# Patient Record
Sex: Female | Born: 1970
Health system: Southern US, Community
[De-identification: ages and names within clinical notes are randomized; demographics above are authoritative.]

## PROBLEM LIST (undated history)

## (undated) DIAGNOSIS — R51 Headache: Secondary | ICD-10-CM

## (undated) DIAGNOSIS — R0602 Shortness of breath: Secondary | ICD-10-CM

## (undated) DIAGNOSIS — Z720 Tobacco use: Secondary | ICD-10-CM

## (undated) DIAGNOSIS — K219 Gastro-esophageal reflux disease without esophagitis: Secondary | ICD-10-CM

## (undated) DIAGNOSIS — J189 Pneumonia, unspecified organism: Secondary | ICD-10-CM

## (undated) DIAGNOSIS — E559 Vitamin D deficiency, unspecified: Secondary | ICD-10-CM

## (undated) DIAGNOSIS — J302 Other seasonal allergic rhinitis: Secondary | ICD-10-CM

## (undated) DIAGNOSIS — M2550 Pain in unspecified joint: Secondary | ICD-10-CM

## (undated) DIAGNOSIS — J449 Chronic obstructive pulmonary disease, unspecified: Secondary | ICD-10-CM

## (undated) DIAGNOSIS — M069 Rheumatoid arthritis, unspecified: Secondary | ICD-10-CM

## (undated) DIAGNOSIS — Z9989 Dependence on other enabling machines and devices: Secondary | ICD-10-CM

## (undated) DIAGNOSIS — F329 Major depressive disorder, single episode, unspecified: Secondary | ICD-10-CM

## (undated) DIAGNOSIS — M549 Dorsalgia, unspecified: Secondary | ICD-10-CM

## (undated) DIAGNOSIS — F419 Anxiety disorder, unspecified: Secondary | ICD-10-CM

## (undated) DIAGNOSIS — R7303 Prediabetes: Secondary | ICD-10-CM

## (undated) DIAGNOSIS — I499 Cardiac arrhythmia, unspecified: Secondary | ICD-10-CM

## (undated) DIAGNOSIS — I5032 Chronic diastolic (congestive) heart failure: Secondary | ICD-10-CM

## (undated) DIAGNOSIS — M1990 Unspecified osteoarthritis, unspecified site: Secondary | ICD-10-CM

## (undated) DIAGNOSIS — A048 Other specified bacterial intestinal infections: Secondary | ICD-10-CM

## (undated) DIAGNOSIS — F32A Depression, unspecified: Secondary | ICD-10-CM

## (undated) DIAGNOSIS — I10 Essential (primary) hypertension: Secondary | ICD-10-CM

## (undated) DIAGNOSIS — G4700 Insomnia, unspecified: Secondary | ICD-10-CM

## (undated) DIAGNOSIS — L83 Acanthosis nigricans: Secondary | ICD-10-CM

## (undated) DIAGNOSIS — G4733 Obstructive sleep apnea (adult) (pediatric): Secondary | ICD-10-CM

## (undated) DIAGNOSIS — E039 Hypothyroidism, unspecified: Secondary | ICD-10-CM

## (undated) DIAGNOSIS — N920 Excessive and frequent menstruation with regular cycle: Secondary | ICD-10-CM

## (undated) DIAGNOSIS — R600 Localized edema: Secondary | ICD-10-CM

## (undated) HISTORY — DX: Vitamin D deficiency, unspecified: E55.9

## (undated) HISTORY — DX: Depression, unspecified: F32.A

## (undated) HISTORY — DX: Chronic diastolic (congestive) heart failure: I50.32

## (undated) HISTORY — DX: Pain in unspecified joint: M25.50

## (undated) HISTORY — DX: Excessive and frequent menstruation with regular cycle: N92.0

## (undated) HISTORY — DX: Chronic obstructive pulmonary disease, unspecified: J44.9

## (undated) HISTORY — DX: Rheumatoid arthritis, unspecified: M06.9

## (undated) HISTORY — DX: Gastro-esophageal reflux disease without esophagitis: K21.9

## (undated) HISTORY — DX: Tobacco use: Z72.0

## (undated) HISTORY — DX: Acanthosis nigricans: L83

## (undated) HISTORY — DX: Other specified bacterial intestinal infections: A04.8

## (undated) HISTORY — DX: Prediabetes: R73.03

## (undated) HISTORY — DX: Dorsalgia, unspecified: M54.9

## (undated) HISTORY — DX: Major depressive disorder, single episode, unspecified: F32.9

## (undated) HISTORY — DX: Morbid (severe) obesity due to excess calories: E66.01

## (undated) HISTORY — DX: Insomnia, unspecified: G47.00

## (undated) HISTORY — DX: Essential (primary) hypertension: I10

## (undated) HISTORY — PX: COLONOSCOPY WITH ESOPHAGOGASTRODUODENOSCOPY (EGD): SHX5779

## (undated) HISTORY — DX: Localized edema: R60.0

---

## 1994-04-29 HISTORY — PX: TUBAL LIGATION: SHX77

## 1998-01-30 ENCOUNTER — Emergency Department (HOSPITAL_COMMUNITY): Admission: EM | Admit: 1998-01-30 | Discharge: 1998-01-30 | Payer: Self-pay | Admitting: *Deleted

## 1998-04-24 ENCOUNTER — Emergency Department (HOSPITAL_COMMUNITY): Admission: EM | Admit: 1998-04-24 | Discharge: 1998-04-24 | Payer: Self-pay | Admitting: Emergency Medicine

## 1998-04-24 ENCOUNTER — Encounter: Payer: Self-pay | Admitting: Emergency Medicine

## 1998-09-10 ENCOUNTER — Emergency Department (HOSPITAL_COMMUNITY): Admission: EM | Admit: 1998-09-10 | Discharge: 1998-09-10 | Payer: Self-pay | Admitting: Emergency Medicine

## 1998-09-28 ENCOUNTER — Observation Stay (HOSPITAL_COMMUNITY): Admission: EM | Admit: 1998-09-28 | Discharge: 1998-09-29 | Payer: Self-pay | Admitting: Emergency Medicine

## 1998-09-28 ENCOUNTER — Encounter: Payer: Self-pay | Admitting: Emergency Medicine

## 1999-03-29 ENCOUNTER — Encounter: Payer: Self-pay | Admitting: Emergency Medicine

## 1999-03-29 ENCOUNTER — Emergency Department (HOSPITAL_COMMUNITY): Admission: EM | Admit: 1999-03-29 | Discharge: 1999-03-29 | Payer: Self-pay | Admitting: Emergency Medicine

## 1999-07-17 ENCOUNTER — Emergency Department (HOSPITAL_COMMUNITY): Admission: EM | Admit: 1999-07-17 | Discharge: 1999-07-17 | Payer: Self-pay | Admitting: Emergency Medicine

## 1999-09-13 ENCOUNTER — Emergency Department (HOSPITAL_COMMUNITY): Admission: EM | Admit: 1999-09-13 | Discharge: 1999-09-13 | Payer: Self-pay | Admitting: Emergency Medicine

## 1999-12-29 ENCOUNTER — Inpatient Hospital Stay (HOSPITAL_COMMUNITY): Admission: EM | Admit: 1999-12-29 | Discharge: 1999-12-31 | Payer: Self-pay | Admitting: Emergency Medicine

## 1999-12-29 ENCOUNTER — Encounter: Payer: Self-pay | Admitting: Emergency Medicine

## 1999-12-30 ENCOUNTER — Encounter: Payer: Self-pay | Admitting: Infectious Diseases

## 2000-01-04 ENCOUNTER — Encounter: Admission: RE | Admit: 2000-01-04 | Discharge: 2000-01-04 | Payer: Self-pay | Admitting: Internal Medicine

## 2000-03-16 ENCOUNTER — Encounter: Payer: Self-pay | Admitting: Emergency Medicine

## 2000-03-16 ENCOUNTER — Emergency Department (HOSPITAL_COMMUNITY): Admission: EM | Admit: 2000-03-16 | Discharge: 2000-03-16 | Payer: Self-pay | Admitting: Emergency Medicine

## 2000-03-25 ENCOUNTER — Inpatient Hospital Stay (HOSPITAL_COMMUNITY): Admission: EM | Admit: 2000-03-25 | Discharge: 2000-03-27 | Payer: Self-pay | Admitting: Emergency Medicine

## 2000-03-25 ENCOUNTER — Encounter: Payer: Self-pay | Admitting: Emergency Medicine

## 2000-03-25 ENCOUNTER — Encounter: Payer: Self-pay | Admitting: Family Medicine

## 2000-04-02 ENCOUNTER — Encounter: Admission: RE | Admit: 2000-04-02 | Discharge: 2000-04-02 | Payer: Self-pay | Admitting: Internal Medicine

## 2000-06-06 ENCOUNTER — Encounter: Admission: RE | Admit: 2000-06-06 | Discharge: 2000-06-06 | Payer: Self-pay | Admitting: Internal Medicine

## 2000-06-30 ENCOUNTER — Emergency Department (HOSPITAL_COMMUNITY): Admission: EM | Admit: 2000-06-30 | Discharge: 2000-07-01 | Payer: Self-pay | Admitting: *Deleted

## 2000-07-01 ENCOUNTER — Ambulatory Visit (HOSPITAL_COMMUNITY): Admission: RE | Admit: 2000-07-01 | Discharge: 2000-07-01 | Payer: Self-pay | Admitting: Internal Medicine

## 2000-07-01 ENCOUNTER — Encounter: Payer: Self-pay | Admitting: Emergency Medicine

## 2000-07-01 ENCOUNTER — Encounter: Admission: RE | Admit: 2000-07-01 | Discharge: 2000-07-01 | Payer: Self-pay | Admitting: Internal Medicine

## 2000-07-03 ENCOUNTER — Encounter: Admission: RE | Admit: 2000-07-03 | Discharge: 2000-07-03 | Payer: Self-pay | Admitting: Hematology and Oncology

## 2000-07-03 ENCOUNTER — Ambulatory Visit (HOSPITAL_COMMUNITY): Admission: RE | Admit: 2000-07-03 | Discharge: 2000-07-03 | Payer: Self-pay | Admitting: Hematology and Oncology

## 2000-07-03 ENCOUNTER — Emergency Department (HOSPITAL_COMMUNITY): Admission: EM | Admit: 2000-07-03 | Discharge: 2000-07-03 | Payer: Self-pay | Admitting: Emergency Medicine

## 2000-07-03 ENCOUNTER — Encounter: Payer: Self-pay | Admitting: Hematology and Oncology

## 2000-07-04 ENCOUNTER — Encounter: Admission: RE | Admit: 2000-07-04 | Discharge: 2000-07-04 | Payer: Self-pay | Admitting: Internal Medicine

## 2000-07-07 ENCOUNTER — Encounter: Admission: RE | Admit: 2000-07-07 | Discharge: 2000-07-07 | Payer: Self-pay | Admitting: Internal Medicine

## 2000-07-09 ENCOUNTER — Ambulatory Visit (HOSPITAL_COMMUNITY): Admission: RE | Admit: 2000-07-09 | Discharge: 2000-07-09 | Payer: Self-pay | Admitting: *Deleted

## 2000-07-10 ENCOUNTER — Encounter: Admission: RE | Admit: 2000-07-10 | Discharge: 2000-07-10 | Payer: Self-pay | Admitting: Hematology and Oncology

## 2000-07-14 ENCOUNTER — Encounter: Admission: RE | Admit: 2000-07-14 | Discharge: 2000-07-14 | Payer: Self-pay | Admitting: Internal Medicine

## 2000-07-21 ENCOUNTER — Encounter: Admission: RE | Admit: 2000-07-21 | Discharge: 2000-07-21 | Payer: Self-pay | Admitting: Internal Medicine

## 2000-07-23 ENCOUNTER — Encounter: Payer: Self-pay | Admitting: Internal Medicine

## 2000-07-23 ENCOUNTER — Ambulatory Visit (HOSPITAL_COMMUNITY): Admission: RE | Admit: 2000-07-23 | Discharge: 2000-07-23 | Payer: Self-pay | Admitting: Internal Medicine

## 2000-07-29 ENCOUNTER — Encounter: Admission: RE | Admit: 2000-07-29 | Discharge: 2000-07-29 | Payer: Self-pay | Admitting: Internal Medicine

## 2000-08-15 ENCOUNTER — Encounter: Admission: RE | Admit: 2000-08-15 | Discharge: 2000-08-15 | Payer: Self-pay

## 2000-08-26 ENCOUNTER — Encounter: Admission: RE | Admit: 2000-08-26 | Discharge: 2000-08-26 | Payer: Self-pay | Admitting: Internal Medicine

## 2000-09-02 ENCOUNTER — Ambulatory Visit (HOSPITAL_COMMUNITY): Admission: RE | Admit: 2000-09-02 | Discharge: 2000-09-02 | Payer: Self-pay

## 2000-09-22 ENCOUNTER — Encounter: Admission: RE | Admit: 2000-09-22 | Discharge: 2000-09-22 | Payer: Self-pay | Admitting: Internal Medicine

## 2000-09-22 ENCOUNTER — Ambulatory Visit (HOSPITAL_COMMUNITY): Admission: RE | Admit: 2000-09-22 | Discharge: 2000-09-22 | Payer: Self-pay | Admitting: Internal Medicine

## 2000-10-02 ENCOUNTER — Emergency Department (HOSPITAL_COMMUNITY): Admission: EM | Admit: 2000-10-02 | Discharge: 2000-10-02 | Payer: Self-pay | Admitting: Emergency Medicine

## 2000-10-13 ENCOUNTER — Encounter: Admission: RE | Admit: 2000-10-13 | Discharge: 2000-10-13 | Payer: Self-pay | Admitting: Internal Medicine

## 2000-10-23 ENCOUNTER — Encounter: Admission: RE | Admit: 2000-10-23 | Discharge: 2000-10-23 | Payer: Self-pay | Admitting: Internal Medicine

## 2000-10-29 ENCOUNTER — Encounter: Admission: RE | Admit: 2000-10-29 | Discharge: 2000-10-29 | Payer: Self-pay | Admitting: Internal Medicine

## 2000-11-08 ENCOUNTER — Emergency Department (HOSPITAL_COMMUNITY): Admission: EM | Admit: 2000-11-08 | Discharge: 2000-11-08 | Payer: Self-pay | Admitting: Emergency Medicine

## 2000-11-12 ENCOUNTER — Encounter: Admission: RE | Admit: 2000-11-12 | Discharge: 2000-11-12 | Payer: Self-pay | Admitting: Internal Medicine

## 2000-12-05 ENCOUNTER — Encounter: Admission: RE | Admit: 2000-12-05 | Discharge: 2000-12-05 | Payer: Self-pay | Admitting: Internal Medicine

## 2001-01-03 ENCOUNTER — Emergency Department (HOSPITAL_COMMUNITY): Admission: EM | Admit: 2001-01-03 | Discharge: 2001-01-03 | Payer: Self-pay | Admitting: Emergency Medicine

## 2001-01-07 ENCOUNTER — Encounter: Admission: RE | Admit: 2001-01-07 | Discharge: 2001-01-07 | Payer: Self-pay | Admitting: Internal Medicine

## 2001-02-03 ENCOUNTER — Emergency Department (HOSPITAL_COMMUNITY): Admission: EM | Admit: 2001-02-03 | Discharge: 2001-02-03 | Payer: Self-pay | Admitting: Emergency Medicine

## 2001-02-05 ENCOUNTER — Encounter: Admission: RE | Admit: 2001-02-05 | Discharge: 2001-02-05 | Payer: Self-pay

## 2001-03-12 ENCOUNTER — Encounter: Admission: RE | Admit: 2001-03-12 | Discharge: 2001-03-12 | Payer: Self-pay | Admitting: Internal Medicine

## 2001-06-10 ENCOUNTER — Encounter: Admission: RE | Admit: 2001-06-10 | Discharge: 2001-06-10 | Payer: Self-pay

## 2001-07-08 ENCOUNTER — Encounter: Admission: RE | Admit: 2001-07-08 | Discharge: 2001-07-08 | Payer: Self-pay | Admitting: *Deleted

## 2001-07-22 ENCOUNTER — Encounter: Admission: RE | Admit: 2001-07-22 | Discharge: 2001-07-22 | Payer: Self-pay | Admitting: Internal Medicine

## 2001-08-10 ENCOUNTER — Encounter: Admission: RE | Admit: 2001-08-10 | Discharge: 2001-08-10 | Payer: Self-pay | Admitting: Internal Medicine

## 2001-08-13 ENCOUNTER — Encounter: Admission: RE | Admit: 2001-08-13 | Discharge: 2001-08-13 | Payer: Self-pay | Admitting: Internal Medicine

## 2001-08-20 ENCOUNTER — Encounter: Admission: RE | Admit: 2001-08-20 | Discharge: 2001-08-20 | Payer: Self-pay | Admitting: Internal Medicine

## 2001-08-24 ENCOUNTER — Encounter: Admission: RE | Admit: 2001-08-24 | Discharge: 2001-08-24 | Payer: Self-pay | Admitting: Internal Medicine

## 2001-08-28 ENCOUNTER — Encounter: Admission: RE | Admit: 2001-08-28 | Discharge: 2001-08-28 | Payer: Self-pay | Admitting: Internal Medicine

## 2001-09-02 ENCOUNTER — Encounter: Payer: Self-pay | Admitting: Emergency Medicine

## 2001-09-02 ENCOUNTER — Emergency Department (HOSPITAL_COMMUNITY): Admission: EM | Admit: 2001-09-02 | Discharge: 2001-09-02 | Payer: Self-pay

## 2001-09-08 ENCOUNTER — Encounter: Admission: RE | Admit: 2001-09-08 | Discharge: 2001-09-08 | Payer: Self-pay | Admitting: Internal Medicine

## 2001-09-22 ENCOUNTER — Encounter: Admission: RE | Admit: 2001-09-22 | Discharge: 2001-09-22 | Payer: Self-pay | Admitting: Internal Medicine

## 2001-10-05 ENCOUNTER — Encounter: Payer: Self-pay | Admitting: Emergency Medicine

## 2001-10-05 ENCOUNTER — Inpatient Hospital Stay (HOSPITAL_COMMUNITY): Admission: EM | Admit: 2001-10-05 | Discharge: 2001-10-07 | Payer: Self-pay

## 2001-10-13 ENCOUNTER — Encounter: Admission: RE | Admit: 2001-10-13 | Discharge: 2001-10-13 | Payer: Self-pay | Admitting: Internal Medicine

## 2001-10-20 ENCOUNTER — Encounter: Admission: RE | Admit: 2001-10-20 | Discharge: 2001-10-20 | Payer: Self-pay | Admitting: Internal Medicine

## 2001-11-23 ENCOUNTER — Encounter: Admission: RE | Admit: 2001-11-23 | Discharge: 2001-11-23 | Payer: Self-pay | Admitting: Internal Medicine

## 2001-12-19 ENCOUNTER — Emergency Department (HOSPITAL_COMMUNITY): Admission: EM | Admit: 2001-12-19 | Discharge: 2001-12-19 | Payer: Self-pay | Admitting: Emergency Medicine

## 2002-01-08 ENCOUNTER — Emergency Department (HOSPITAL_COMMUNITY): Admission: EM | Admit: 2002-01-08 | Discharge: 2002-01-08 | Payer: Self-pay | Admitting: Emergency Medicine

## 2002-02-04 ENCOUNTER — Emergency Department (HOSPITAL_COMMUNITY): Admission: EM | Admit: 2002-02-04 | Discharge: 2002-02-04 | Payer: Self-pay | Admitting: *Deleted

## 2002-02-16 ENCOUNTER — Emergency Department (HOSPITAL_COMMUNITY): Admission: EM | Admit: 2002-02-16 | Discharge: 2002-02-16 | Payer: Self-pay | Admitting: Emergency Medicine

## 2002-02-24 ENCOUNTER — Emergency Department (HOSPITAL_COMMUNITY): Admission: EM | Admit: 2002-02-24 | Discharge: 2002-02-24 | Payer: Self-pay | Admitting: Emergency Medicine

## 2002-03-07 ENCOUNTER — Emergency Department (HOSPITAL_COMMUNITY): Admission: EM | Admit: 2002-03-07 | Discharge: 2002-03-07 | Payer: Self-pay | Admitting: Emergency Medicine

## 2002-03-19 ENCOUNTER — Encounter: Admission: RE | Admit: 2002-03-19 | Discharge: 2002-03-19 | Payer: Self-pay | Admitting: Internal Medicine

## 2002-04-01 ENCOUNTER — Encounter: Admission: RE | Admit: 2002-04-01 | Discharge: 2002-04-01 | Payer: Self-pay | Admitting: Internal Medicine

## 2002-05-10 ENCOUNTER — Emergency Department (HOSPITAL_COMMUNITY): Admission: EM | Admit: 2002-05-10 | Discharge: 2002-05-10 | Payer: Self-pay | Admitting: Emergency Medicine

## 2002-05-11 ENCOUNTER — Encounter: Admission: RE | Admit: 2002-05-11 | Discharge: 2002-05-11 | Payer: Self-pay | Admitting: Internal Medicine

## 2002-06-20 ENCOUNTER — Emergency Department (HOSPITAL_COMMUNITY): Admission: EM | Admit: 2002-06-20 | Discharge: 2002-06-20 | Payer: Self-pay | Admitting: Emergency Medicine

## 2002-06-30 ENCOUNTER — Emergency Department (HOSPITAL_COMMUNITY): Admission: EM | Admit: 2002-06-30 | Discharge: 2002-06-30 | Payer: Self-pay | Admitting: Emergency Medicine

## 2002-07-15 ENCOUNTER — Encounter: Admission: RE | Admit: 2002-07-15 | Discharge: 2002-07-15 | Payer: Self-pay | Admitting: Internal Medicine

## 2002-07-23 ENCOUNTER — Emergency Department (HOSPITAL_COMMUNITY): Admission: EM | Admit: 2002-07-23 | Discharge: 2002-07-23 | Payer: Self-pay | Admitting: Emergency Medicine

## 2002-07-23 ENCOUNTER — Encounter: Payer: Self-pay | Admitting: Emergency Medicine

## 2002-07-29 ENCOUNTER — Emergency Department (HOSPITAL_COMMUNITY): Admission: EM | Admit: 2002-07-29 | Discharge: 2002-07-29 | Payer: Self-pay | Admitting: Emergency Medicine

## 2002-07-31 ENCOUNTER — Emergency Department (HOSPITAL_COMMUNITY): Admission: EM | Admit: 2002-07-31 | Discharge: 2002-07-31 | Payer: Self-pay | Admitting: Emergency Medicine

## 2002-08-08 ENCOUNTER — Emergency Department (HOSPITAL_COMMUNITY): Admission: EM | Admit: 2002-08-08 | Discharge: 2002-08-08 | Payer: Self-pay | Admitting: Emergency Medicine

## 2002-08-09 ENCOUNTER — Encounter: Admission: RE | Admit: 2002-08-09 | Discharge: 2002-08-09 | Payer: Self-pay | Admitting: Infectious Diseases

## 2002-08-28 ENCOUNTER — Emergency Department (HOSPITAL_COMMUNITY): Admission: EM | Admit: 2002-08-28 | Discharge: 2002-08-28 | Payer: Self-pay | Admitting: Emergency Medicine

## 2002-08-30 ENCOUNTER — Encounter: Admission: RE | Admit: 2002-08-30 | Discharge: 2002-08-30 | Payer: Self-pay | Admitting: Internal Medicine

## 2002-09-05 ENCOUNTER — Emergency Department (HOSPITAL_COMMUNITY): Admission: EM | Admit: 2002-09-05 | Discharge: 2002-09-05 | Payer: Self-pay | Admitting: *Deleted

## 2002-09-23 ENCOUNTER — Encounter: Admission: RE | Admit: 2002-09-23 | Discharge: 2002-09-23 | Payer: Self-pay | Admitting: Internal Medicine

## 2002-09-29 ENCOUNTER — Ambulatory Visit (HOSPITAL_COMMUNITY): Admission: RE | Admit: 2002-09-29 | Discharge: 2002-09-29 | Payer: Self-pay | Admitting: Internal Medicine

## 2002-09-29 ENCOUNTER — Encounter: Payer: Self-pay | Admitting: Internal Medicine

## 2002-10-04 ENCOUNTER — Encounter: Admission: RE | Admit: 2002-10-04 | Discharge: 2002-10-04 | Payer: Self-pay | Admitting: Internal Medicine

## 2002-10-04 ENCOUNTER — Encounter: Payer: Self-pay | Admitting: Internal Medicine

## 2002-10-04 ENCOUNTER — Ambulatory Visit (HOSPITAL_COMMUNITY): Admission: RE | Admit: 2002-10-04 | Discharge: 2002-10-04 | Payer: Self-pay | Admitting: Internal Medicine

## 2002-10-13 ENCOUNTER — Encounter: Admission: RE | Admit: 2002-10-13 | Discharge: 2002-10-13 | Payer: Self-pay | Admitting: Internal Medicine

## 2002-11-08 ENCOUNTER — Encounter: Admission: RE | Admit: 2002-11-08 | Discharge: 2002-11-08 | Payer: Self-pay | Admitting: Internal Medicine

## 2002-11-24 ENCOUNTER — Encounter: Admission: RE | Admit: 2002-11-24 | Discharge: 2002-11-24 | Payer: Self-pay | Admitting: Internal Medicine

## 2002-12-07 ENCOUNTER — Emergency Department (HOSPITAL_COMMUNITY): Admission: EM | Admit: 2002-12-07 | Discharge: 2002-12-07 | Payer: Self-pay | Admitting: Emergency Medicine

## 2002-12-08 ENCOUNTER — Encounter: Admission: RE | Admit: 2002-12-08 | Discharge: 2002-12-08 | Payer: Self-pay | Admitting: Infectious Diseases

## 2002-12-20 ENCOUNTER — Emergency Department (HOSPITAL_COMMUNITY): Admission: EM | Admit: 2002-12-20 | Discharge: 2002-12-20 | Payer: Self-pay | Admitting: Emergency Medicine

## 2002-12-22 ENCOUNTER — Encounter: Admission: RE | Admit: 2002-12-22 | Discharge: 2002-12-22 | Payer: Self-pay | Admitting: Internal Medicine

## 2003-02-11 ENCOUNTER — Emergency Department (HOSPITAL_COMMUNITY): Admission: EM | Admit: 2003-02-11 | Discharge: 2003-02-11 | Payer: Self-pay | Admitting: Emergency Medicine

## 2003-03-10 ENCOUNTER — Emergency Department (HOSPITAL_COMMUNITY): Admission: EM | Admit: 2003-03-10 | Discharge: 2003-03-11 | Payer: Self-pay | Admitting: Emergency Medicine

## 2003-03-22 ENCOUNTER — Encounter: Admission: RE | Admit: 2003-03-22 | Discharge: 2003-03-22 | Payer: Self-pay | Admitting: Internal Medicine

## 2003-04-30 ENCOUNTER — Emergency Department (HOSPITAL_COMMUNITY): Admission: EM | Admit: 2003-04-30 | Discharge: 2003-04-30 | Payer: Self-pay | Admitting: Emergency Medicine

## 2003-05-17 ENCOUNTER — Emergency Department (HOSPITAL_COMMUNITY): Admission: EM | Admit: 2003-05-17 | Discharge: 2003-05-17 | Payer: Self-pay | Admitting: Emergency Medicine

## 2003-05-22 ENCOUNTER — Emergency Department (HOSPITAL_COMMUNITY): Admission: EM | Admit: 2003-05-22 | Discharge: 2003-05-22 | Payer: Self-pay | Admitting: *Deleted

## 2003-05-31 ENCOUNTER — Emergency Department (HOSPITAL_COMMUNITY): Admission: EM | Admit: 2003-05-31 | Discharge: 2003-05-31 | Payer: Self-pay | Admitting: Emergency Medicine

## 2003-06-11 ENCOUNTER — Emergency Department (HOSPITAL_COMMUNITY): Admission: EM | Admit: 2003-06-11 | Discharge: 2003-06-11 | Payer: Self-pay | Admitting: Emergency Medicine

## 2003-06-25 ENCOUNTER — Emergency Department (HOSPITAL_COMMUNITY): Admission: EM | Admit: 2003-06-25 | Discharge: 2003-06-25 | Payer: Self-pay

## 2003-07-04 ENCOUNTER — Emergency Department (HOSPITAL_COMMUNITY): Admission: EM | Admit: 2003-07-04 | Discharge: 2003-07-04 | Payer: Self-pay | Admitting: Emergency Medicine

## 2003-07-09 ENCOUNTER — Emergency Department (HOSPITAL_COMMUNITY): Admission: EM | Admit: 2003-07-09 | Discharge: 2003-07-09 | Payer: Self-pay | Admitting: Emergency Medicine

## 2003-07-14 ENCOUNTER — Emergency Department (HOSPITAL_COMMUNITY): Admission: EM | Admit: 2003-07-14 | Discharge: 2003-07-14 | Payer: Self-pay | Admitting: Emergency Medicine

## 2003-07-23 ENCOUNTER — Emergency Department (HOSPITAL_COMMUNITY): Admission: EM | Admit: 2003-07-23 | Discharge: 2003-07-24 | Payer: Self-pay | Admitting: Emergency Medicine

## 2003-07-31 ENCOUNTER — Emergency Department (HOSPITAL_COMMUNITY): Admission: EM | Admit: 2003-07-31 | Discharge: 2003-07-31 | Payer: Self-pay | Admitting: Emergency Medicine

## 2003-08-04 ENCOUNTER — Emergency Department (HOSPITAL_COMMUNITY): Admission: EM | Admit: 2003-08-04 | Discharge: 2003-08-04 | Payer: Self-pay | Admitting: Emergency Medicine

## 2003-08-10 ENCOUNTER — Encounter: Admission: RE | Admit: 2003-08-10 | Discharge: 2003-08-10 | Payer: Self-pay | Admitting: Internal Medicine

## 2003-08-12 ENCOUNTER — Emergency Department (HOSPITAL_COMMUNITY): Admission: EM | Admit: 2003-08-12 | Discharge: 2003-08-12 | Payer: Self-pay | Admitting: Emergency Medicine

## 2003-08-16 ENCOUNTER — Emergency Department (HOSPITAL_COMMUNITY): Admission: EM | Admit: 2003-08-16 | Discharge: 2003-08-16 | Payer: Self-pay | Admitting: Emergency Medicine

## 2003-08-23 ENCOUNTER — Emergency Department (HOSPITAL_COMMUNITY): Admission: EM | Admit: 2003-08-23 | Discharge: 2003-08-23 | Payer: Self-pay | Admitting: Emergency Medicine

## 2003-09-01 ENCOUNTER — Emergency Department (HOSPITAL_COMMUNITY): Admission: EM | Admit: 2003-09-01 | Discharge: 2003-09-01 | Payer: Self-pay | Admitting: Emergency Medicine

## 2003-09-08 ENCOUNTER — Emergency Department (HOSPITAL_COMMUNITY): Admission: EM | Admit: 2003-09-08 | Discharge: 2003-09-08 | Payer: Self-pay | Admitting: Emergency Medicine

## 2003-09-08 ENCOUNTER — Emergency Department (HOSPITAL_COMMUNITY): Admission: EM | Admit: 2003-09-08 | Discharge: 2003-09-08 | Payer: Self-pay

## 2003-10-16 ENCOUNTER — Emergency Department (HOSPITAL_COMMUNITY): Admission: EM | Admit: 2003-10-16 | Discharge: 2003-10-16 | Payer: Self-pay | Admitting: Emergency Medicine

## 2003-10-23 ENCOUNTER — Emergency Department (HOSPITAL_COMMUNITY): Admission: EM | Admit: 2003-10-23 | Discharge: 2003-10-23 | Payer: Self-pay | Admitting: Emergency Medicine

## 2003-11-01 ENCOUNTER — Emergency Department (HOSPITAL_COMMUNITY): Admission: EM | Admit: 2003-11-01 | Discharge: 2003-11-01 | Payer: Self-pay | Admitting: Emergency Medicine

## 2003-11-08 ENCOUNTER — Emergency Department (HOSPITAL_COMMUNITY): Admission: EM | Admit: 2003-11-08 | Discharge: 2003-11-08 | Payer: Self-pay | Admitting: Family Medicine

## 2003-11-14 ENCOUNTER — Emergency Department (HOSPITAL_COMMUNITY): Admission: EM | Admit: 2003-11-14 | Discharge: 2003-11-14 | Payer: Self-pay | Admitting: Family Medicine

## 2003-11-19 ENCOUNTER — Emergency Department (HOSPITAL_COMMUNITY): Admission: EM | Admit: 2003-11-19 | Discharge: 2003-11-19 | Payer: Self-pay | Admitting: Emergency Medicine

## 2003-11-24 ENCOUNTER — Emergency Department (HOSPITAL_COMMUNITY): Admission: EM | Admit: 2003-11-24 | Discharge: 2003-11-24 | Payer: Self-pay | Admitting: Family Medicine

## 2003-12-08 ENCOUNTER — Emergency Department (HOSPITAL_COMMUNITY): Admission: EM | Admit: 2003-12-08 | Discharge: 2003-12-08 | Payer: Self-pay | Admitting: *Deleted

## 2003-12-26 ENCOUNTER — Emergency Department (HOSPITAL_COMMUNITY): Admission: EM | Admit: 2003-12-26 | Discharge: 2003-12-26 | Payer: Self-pay | Admitting: Emergency Medicine

## 2004-01-07 ENCOUNTER — Emergency Department (HOSPITAL_COMMUNITY): Admission: EM | Admit: 2004-01-07 | Discharge: 2004-01-07 | Payer: Self-pay | Admitting: Emergency Medicine

## 2004-01-08 ENCOUNTER — Emergency Department (HOSPITAL_COMMUNITY): Admission: EM | Admit: 2004-01-08 | Discharge: 2004-01-08 | Payer: Self-pay | Admitting: Emergency Medicine

## 2004-02-04 ENCOUNTER — Emergency Department (HOSPITAL_COMMUNITY): Admission: EM | Admit: 2004-02-04 | Discharge: 2004-02-05 | Payer: Self-pay | Admitting: Emergency Medicine

## 2004-02-22 ENCOUNTER — Emergency Department (HOSPITAL_COMMUNITY): Admission: EM | Admit: 2004-02-22 | Discharge: 2004-02-22 | Payer: Self-pay | Admitting: Emergency Medicine

## 2004-04-23 ENCOUNTER — Emergency Department (HOSPITAL_COMMUNITY): Admission: EM | Admit: 2004-04-23 | Discharge: 2004-04-23 | Payer: Self-pay | Admitting: Family Medicine

## 2004-04-29 ENCOUNTER — Ambulatory Visit: Payer: Self-pay | Admitting: Internal Medicine

## 2004-04-29 ENCOUNTER — Ambulatory Visit: Payer: Self-pay | Admitting: *Deleted

## 2004-04-29 ENCOUNTER — Inpatient Hospital Stay (HOSPITAL_COMMUNITY): Admission: EM | Admit: 2004-04-29 | Discharge: 2004-05-03 | Payer: Self-pay | Admitting: Emergency Medicine

## 2004-05-08 ENCOUNTER — Ambulatory Visit: Payer: Self-pay | Admitting: Internal Medicine

## 2004-05-22 ENCOUNTER — Ambulatory Visit: Payer: Self-pay | Admitting: Internal Medicine

## 2004-07-12 ENCOUNTER — Emergency Department (HOSPITAL_COMMUNITY): Admission: EM | Admit: 2004-07-12 | Discharge: 2004-07-12 | Payer: Self-pay | Admitting: Family Medicine

## 2004-07-29 IMAGING — CR DG CHEST 2V
2 series · 2 of 2 positions shown · non-contrast
Comparison: none

CLINICAL DATA: Throat pain.  Asthma and shortness of breath.

[view not recorded (1 of 2)]
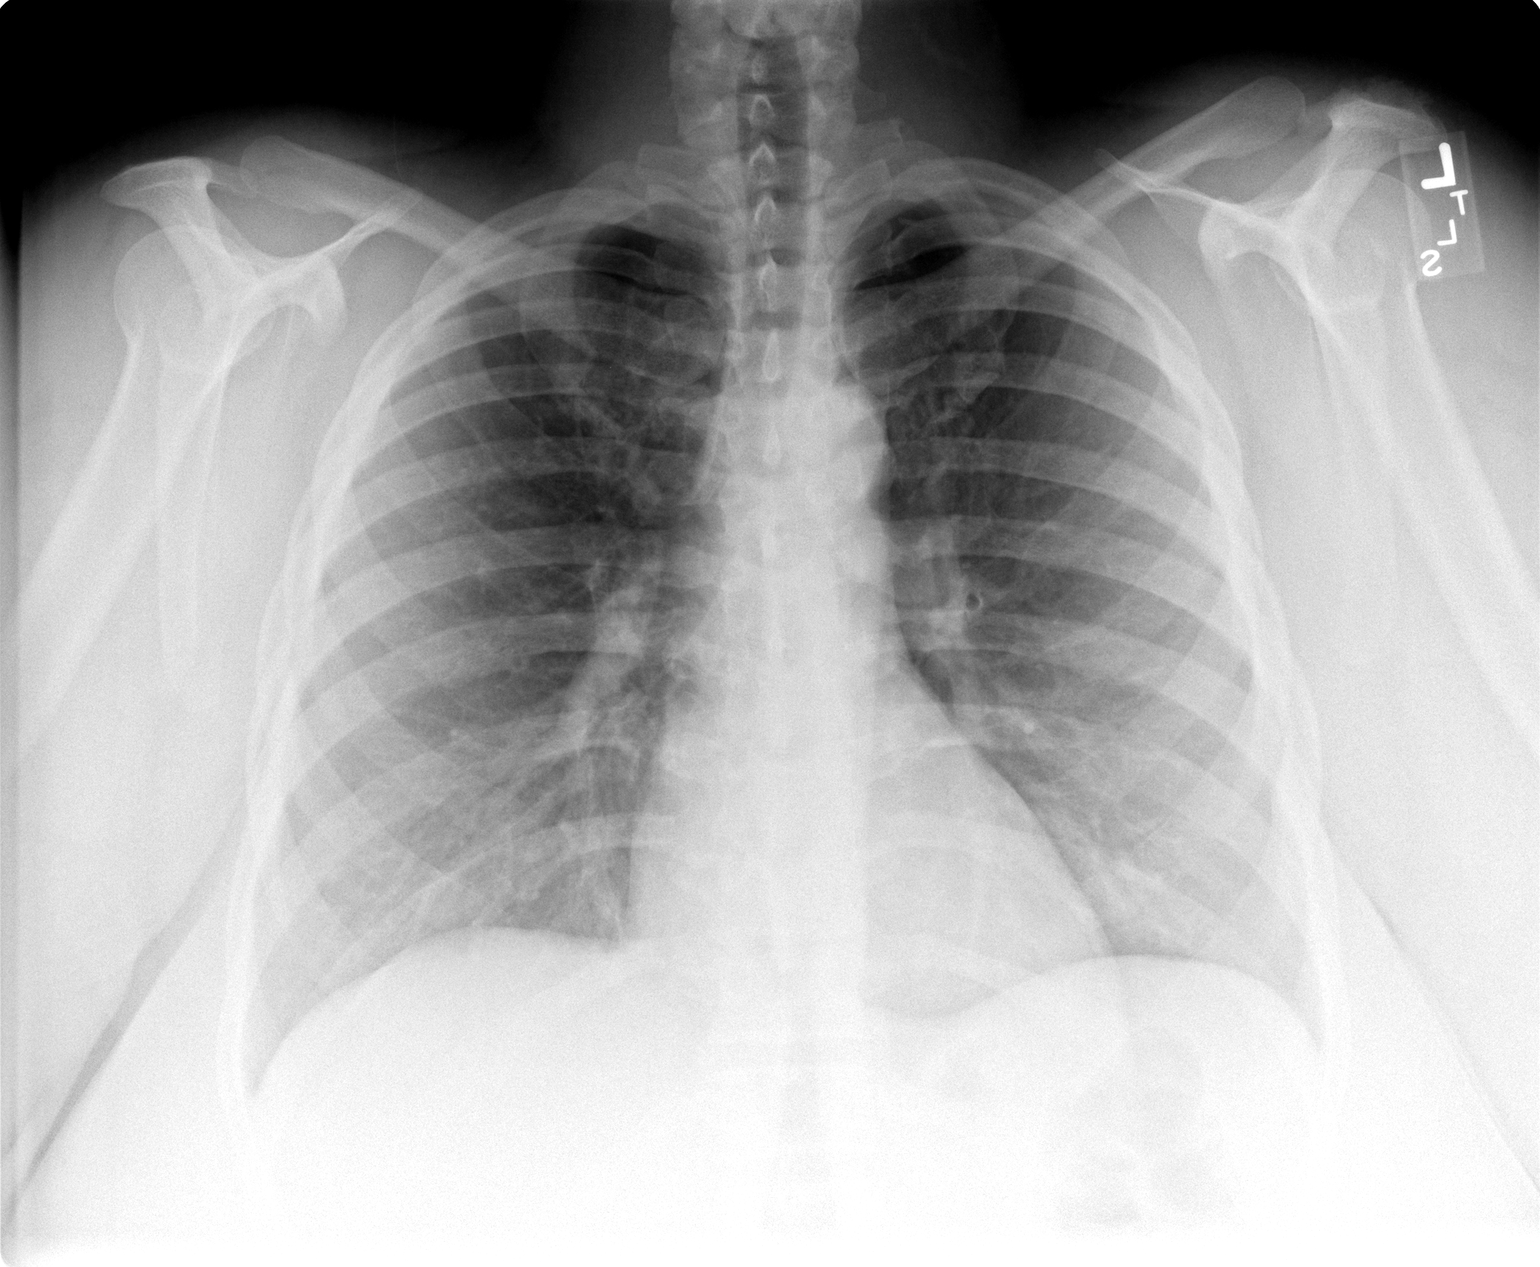

[view not recorded (2 of 2)]
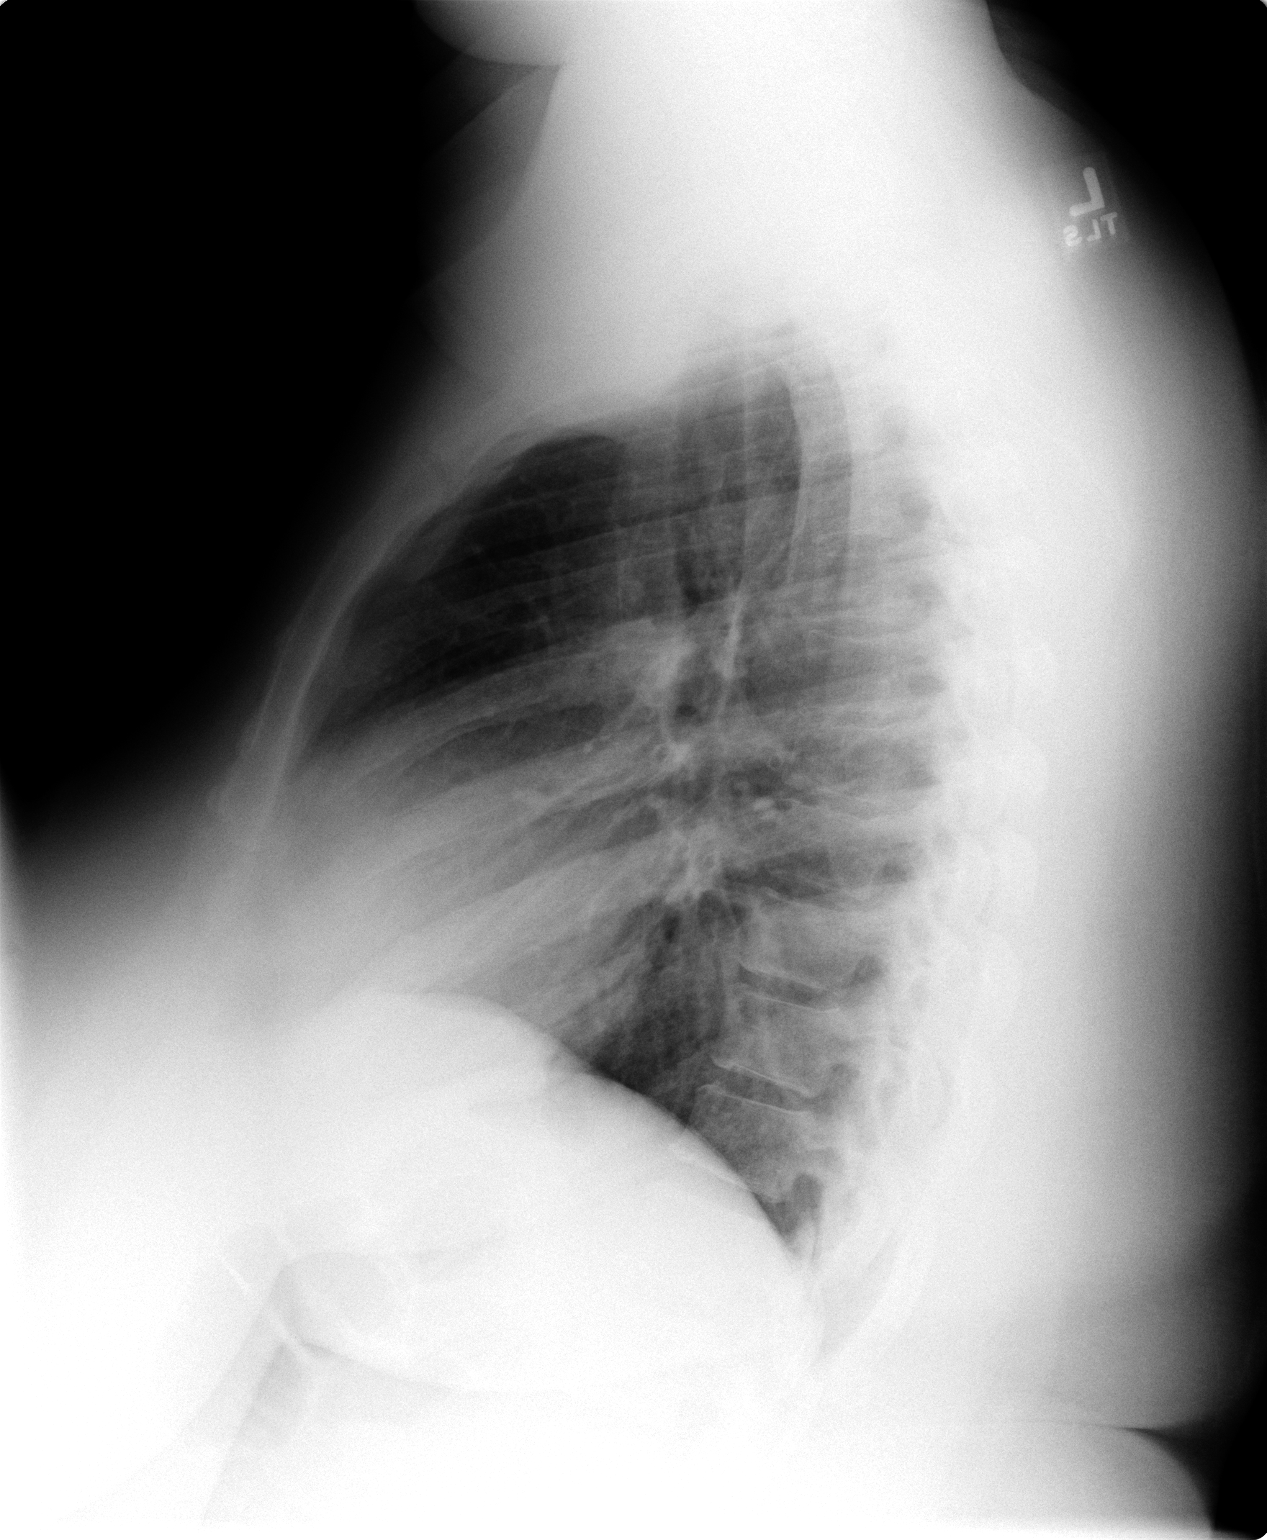

[2 of 2 positions shown; findings below may reference images not displayed]

PA AND LATERAL CHEST, 07/14/03
 No comparisons. 

 Mild peribronchial thickening consistent with bronchitic changes or asthmatic type changes.  No segmental infiltrate or pneumothorax.  Mediastinal and cardiac silhouette otherwise within normal limits. 

 IMPRESSION
 Mild peribronchial thickening.  Question asthmatic changes versus bronchitis type changes. 

 No segmental infiltrate. 

 [REDACTED]

## 2004-08-14 ENCOUNTER — Emergency Department (HOSPITAL_COMMUNITY): Admission: EM | Admit: 2004-08-14 | Discharge: 2004-08-14 | Payer: Self-pay | Admitting: Emergency Medicine

## 2004-08-25 ENCOUNTER — Emergency Department (HOSPITAL_COMMUNITY): Admission: EM | Admit: 2004-08-25 | Discharge: 2004-08-25 | Payer: Self-pay | Admitting: Family Medicine

## 2004-09-07 ENCOUNTER — Emergency Department (HOSPITAL_COMMUNITY): Admission: EM | Admit: 2004-09-07 | Discharge: 2004-09-07 | Payer: Self-pay | Admitting: Emergency Medicine

## 2004-09-07 IMAGING — CR DG CHEST 2V
2 series · 2 of 2 positions shown · non-contrast
Comparison: none

CLINICAL DATA: Asthma. Shortness of breath and cough. 
 TWO VIEW CHEST RADIOGRAPH
 Comparing July 14, 2003.

[view not recorded (1 of 2)]
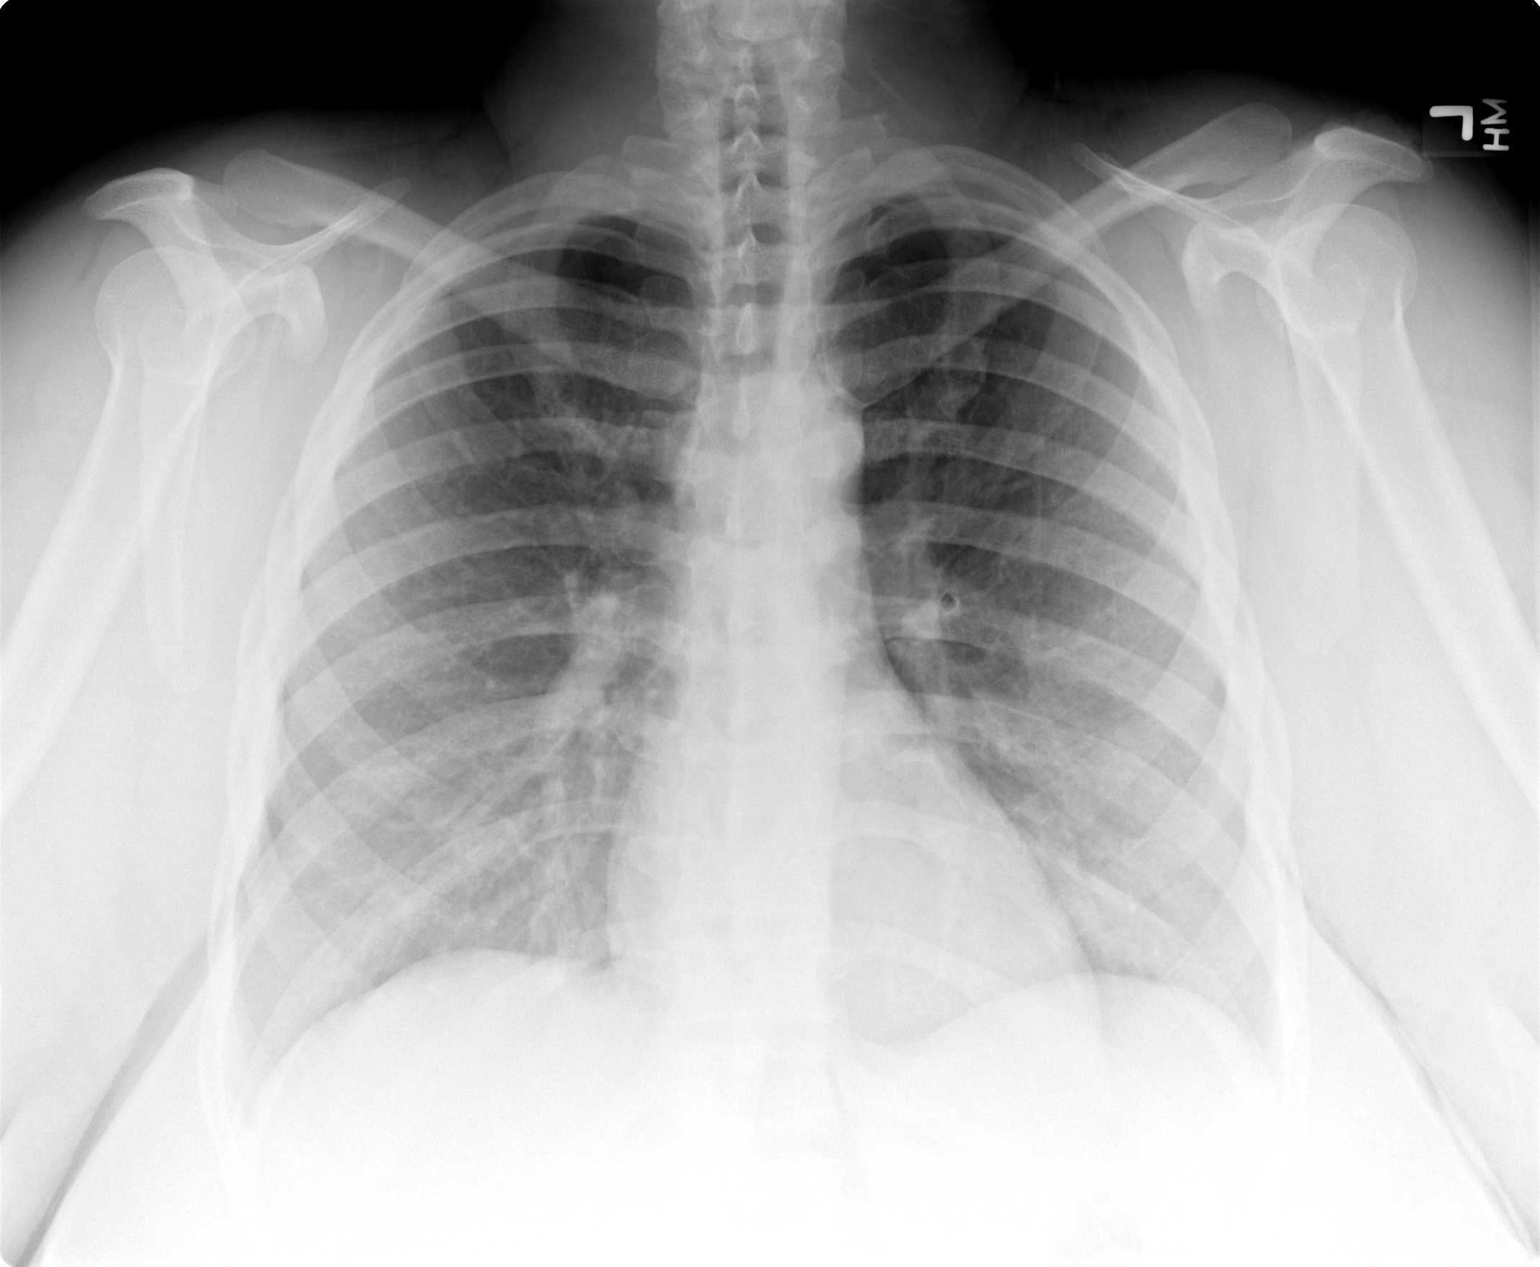

[view not recorded (2 of 2)]
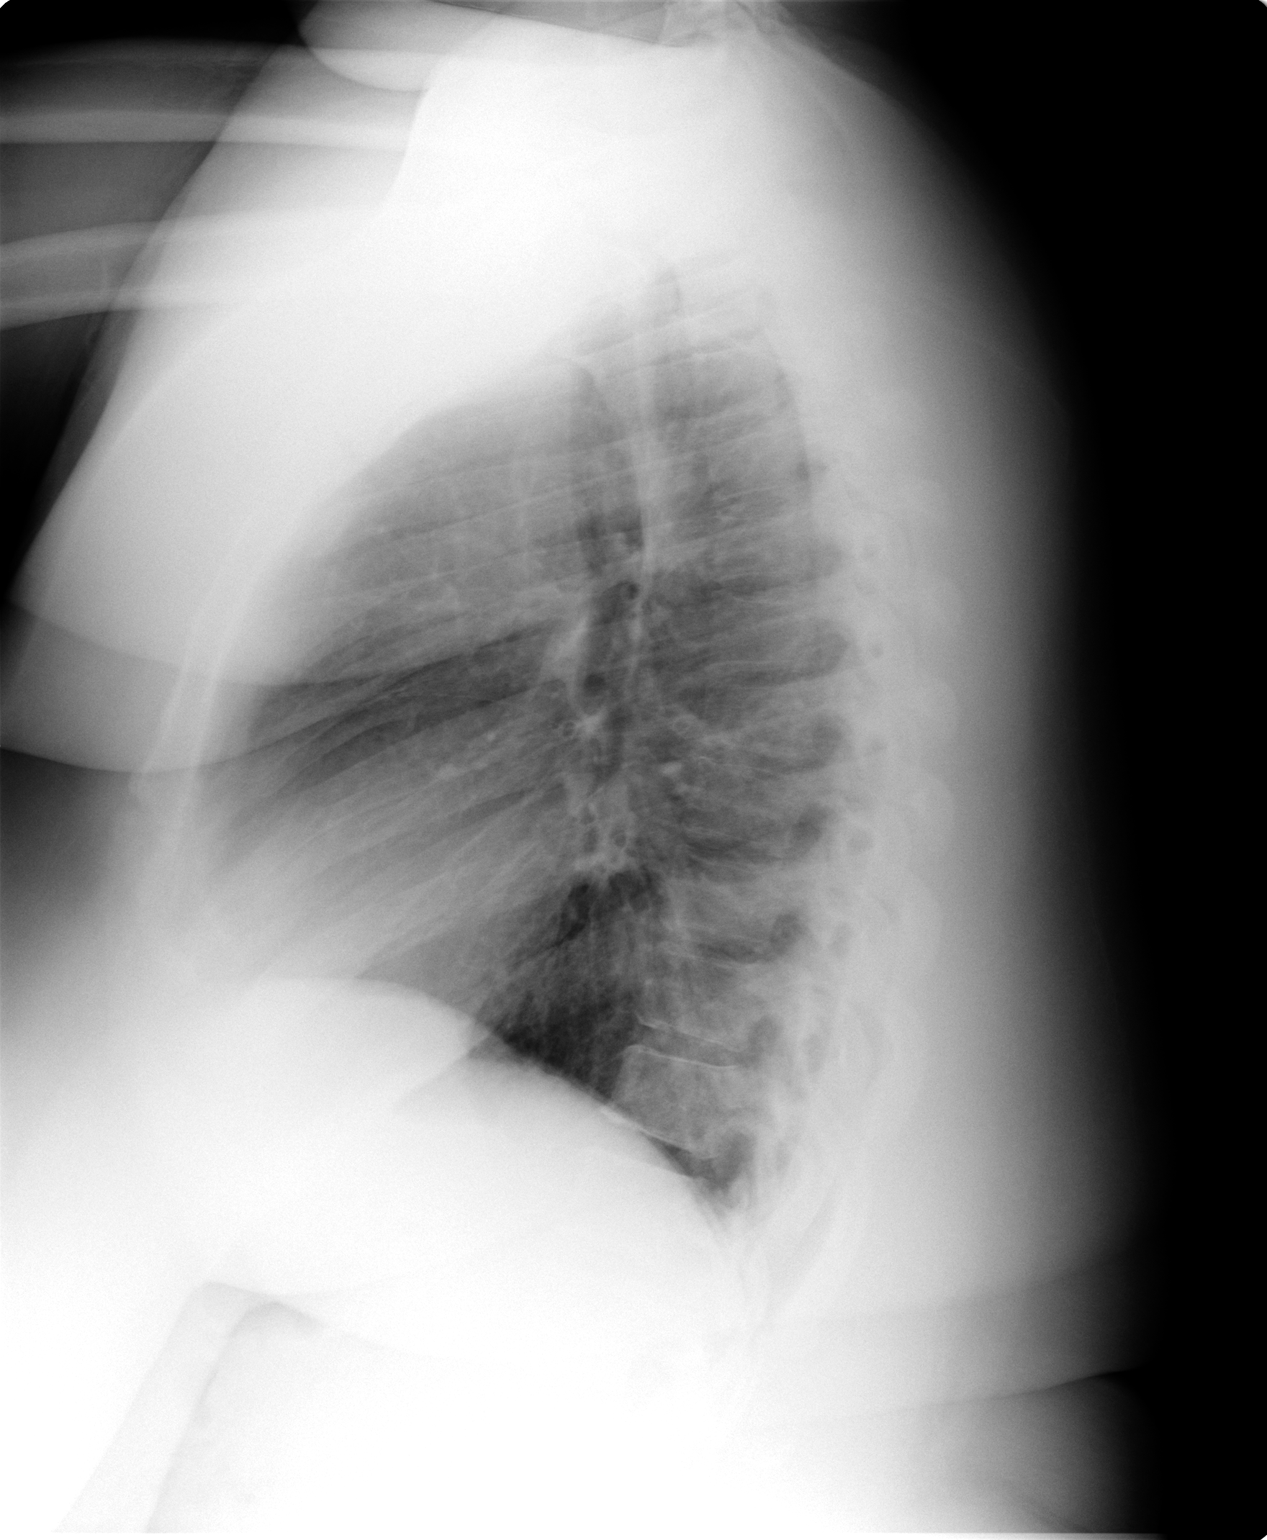

[2 of 2 positions shown; findings below may reference images not displayed]

FINDINGS: There is stable mild airway thickening suggesting bronchitis or reactive airways disease.  No air space opacity or pleural effusion.  Heart and mediastinum appear unremarkable.  
 IMPRESSION
 Mild airway thickening consistent with bronchitis or reactive airways disease.

## 2004-10-22 ENCOUNTER — Emergency Department (HOSPITAL_COMMUNITY): Admission: EM | Admit: 2004-10-22 | Discharge: 2004-10-22 | Payer: Self-pay | Admitting: Emergency Medicine

## 2004-12-05 ENCOUNTER — Emergency Department (HOSPITAL_COMMUNITY): Admission: EM | Admit: 2004-12-05 | Discharge: 2004-12-05 | Payer: Self-pay | Admitting: Emergency Medicine

## 2005-01-10 IMAGING — CR DG CHEST 2V
2 series · 2 of 2 positions shown · non-contrast
Comparison: none

CLINICAL DATA: Shortness of breath since this morning.
TWO VIEW CHEST
Comparison 08/23/03.  Chronic bronchitic changes.  No active infiltrate.  Negative for vascular congestion.  Normal heart size.  
IMPRESSION
COPD, otherwise negative chest x-ray.

[view not recorded (1 of 2)]
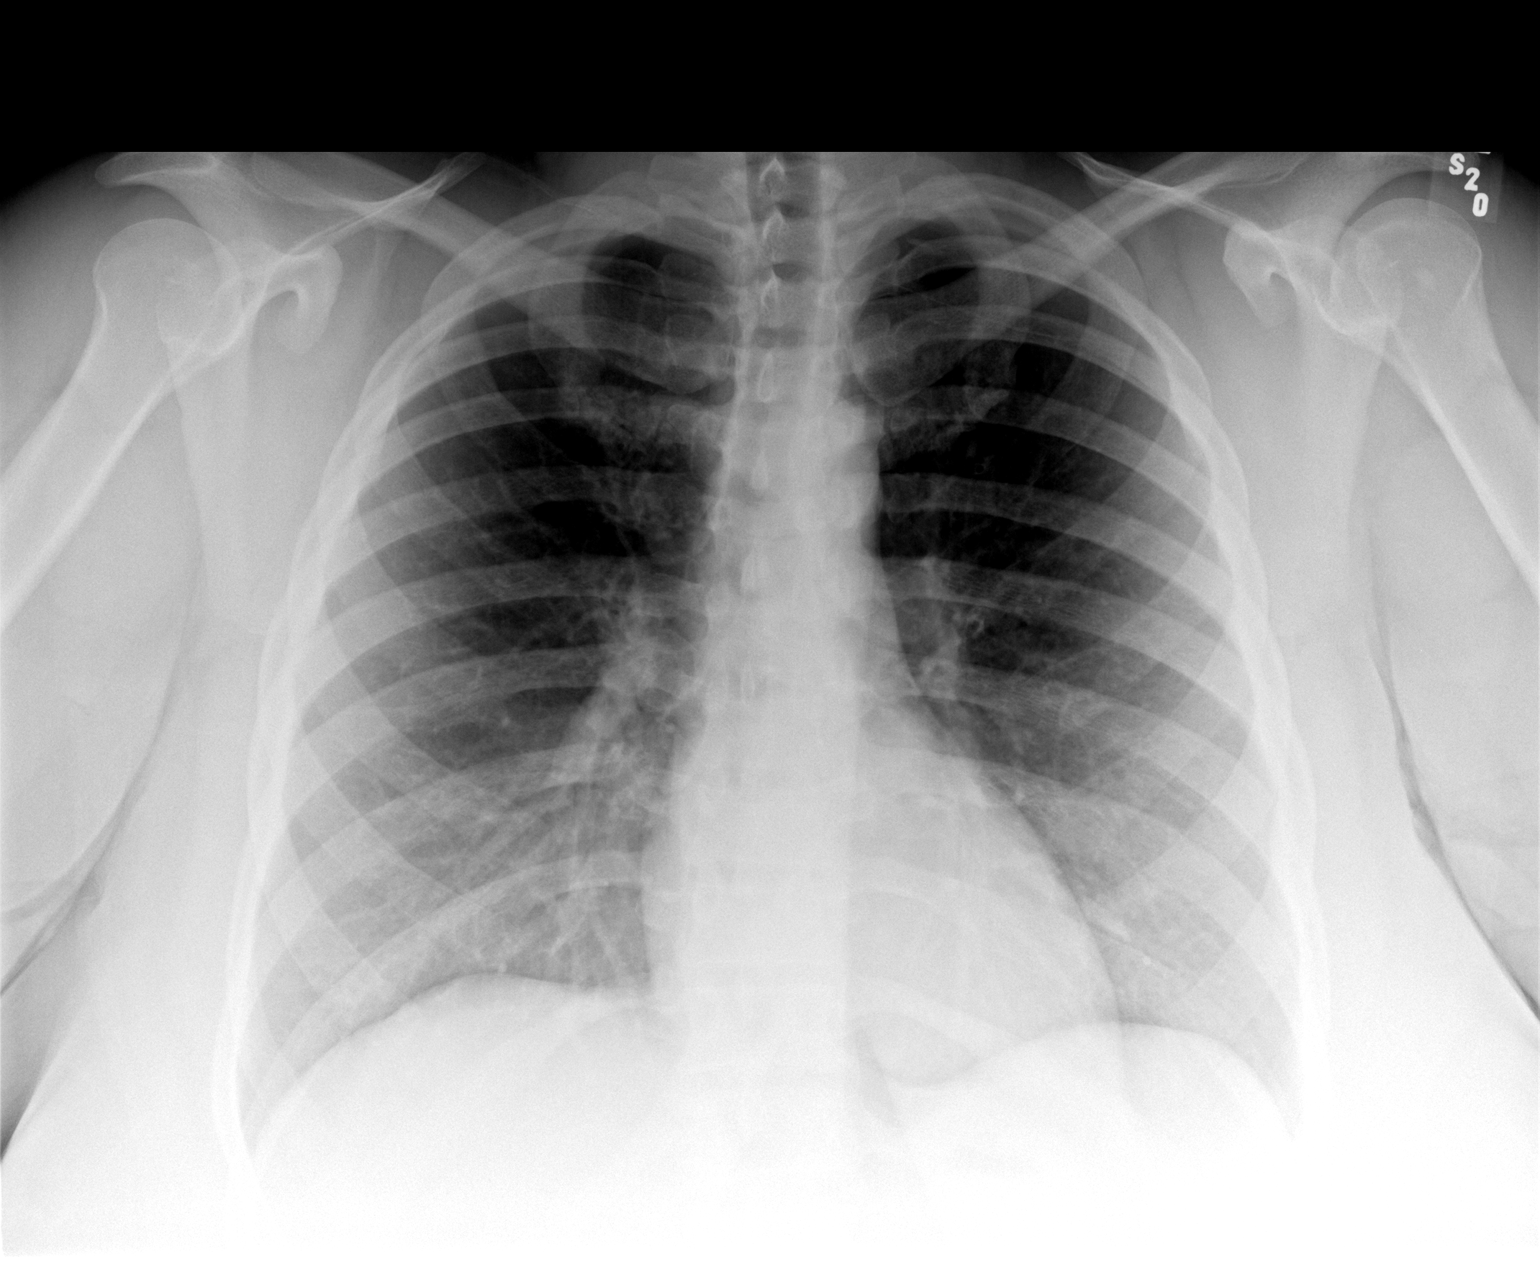

[view not recorded (2 of 2)]
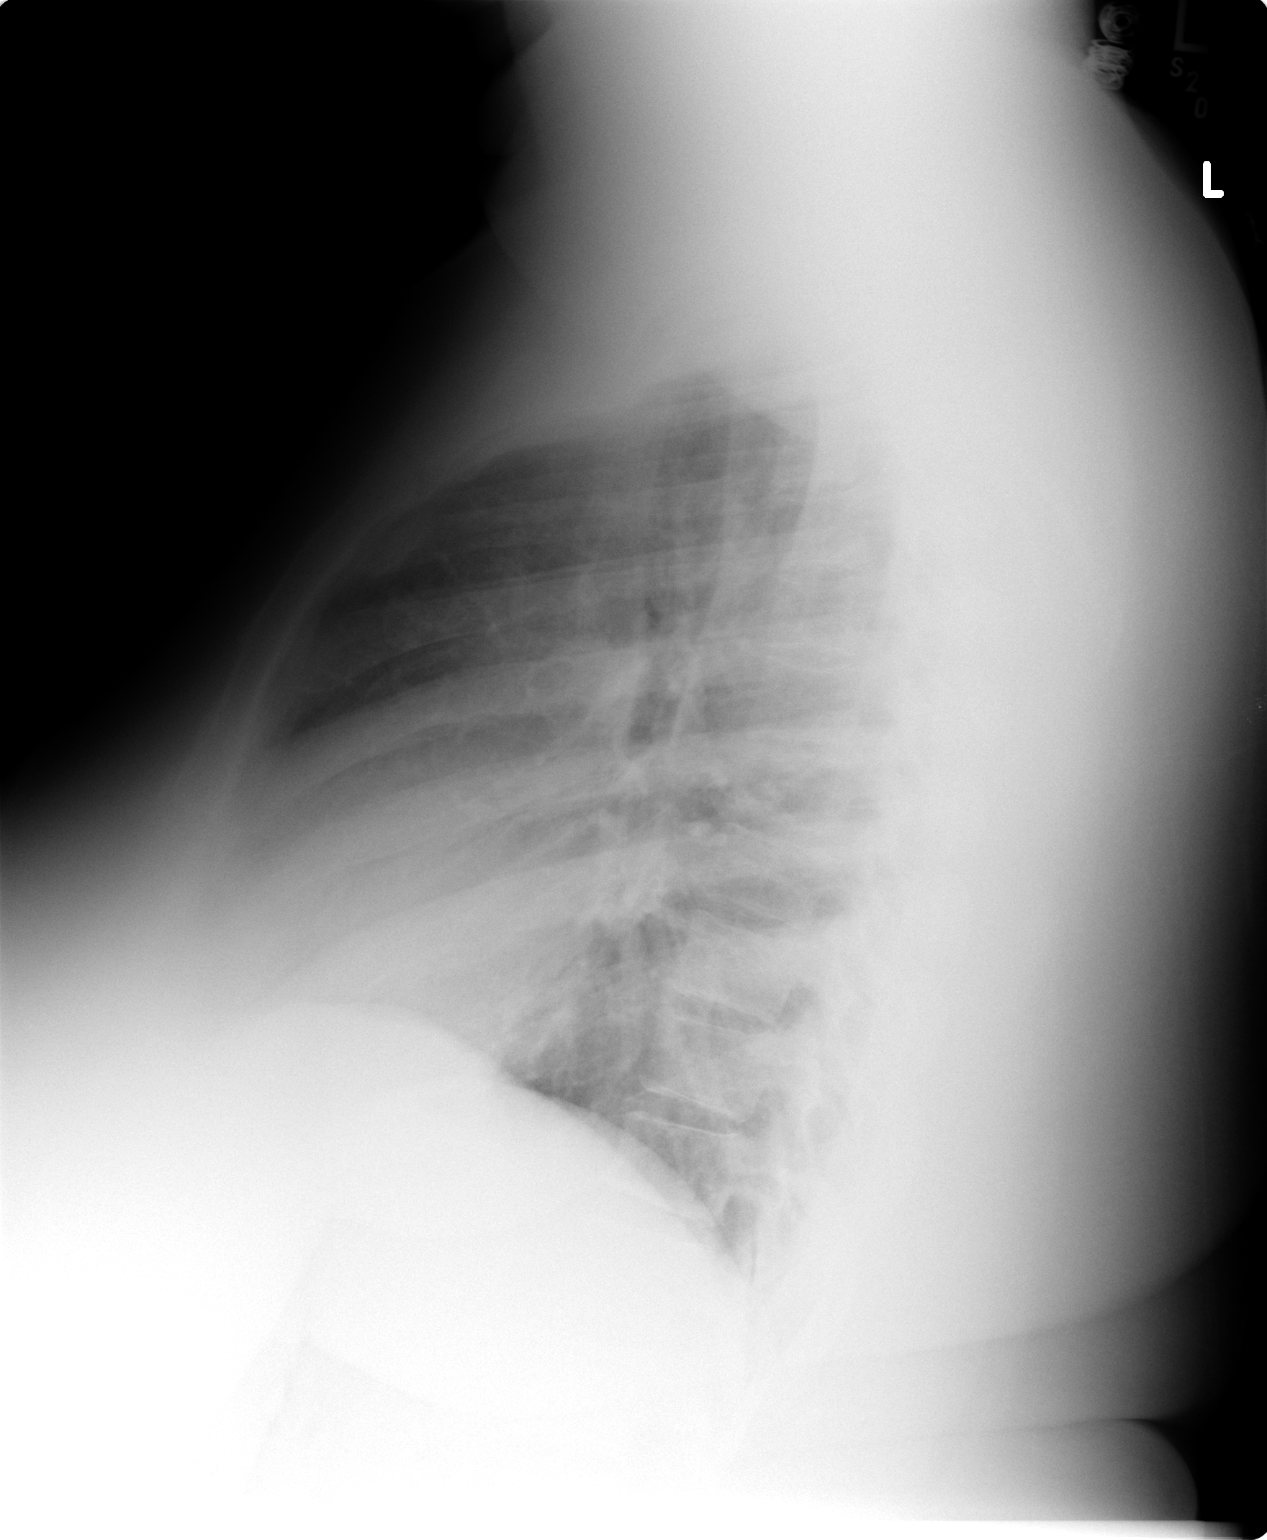

[2 of 2 positions shown; findings below may reference images not displayed]

## 2005-01-22 IMAGING — CR DG CHEST 2V
2 series · 2 of 2 positions shown · non-contrast
Comparison: 12/26/03.

CLINICAL DATA: Acute shortness of breath.  Asthma.  Hypertension.
 TWO VIEW CHEST

[view not recorded (1 of 2)]
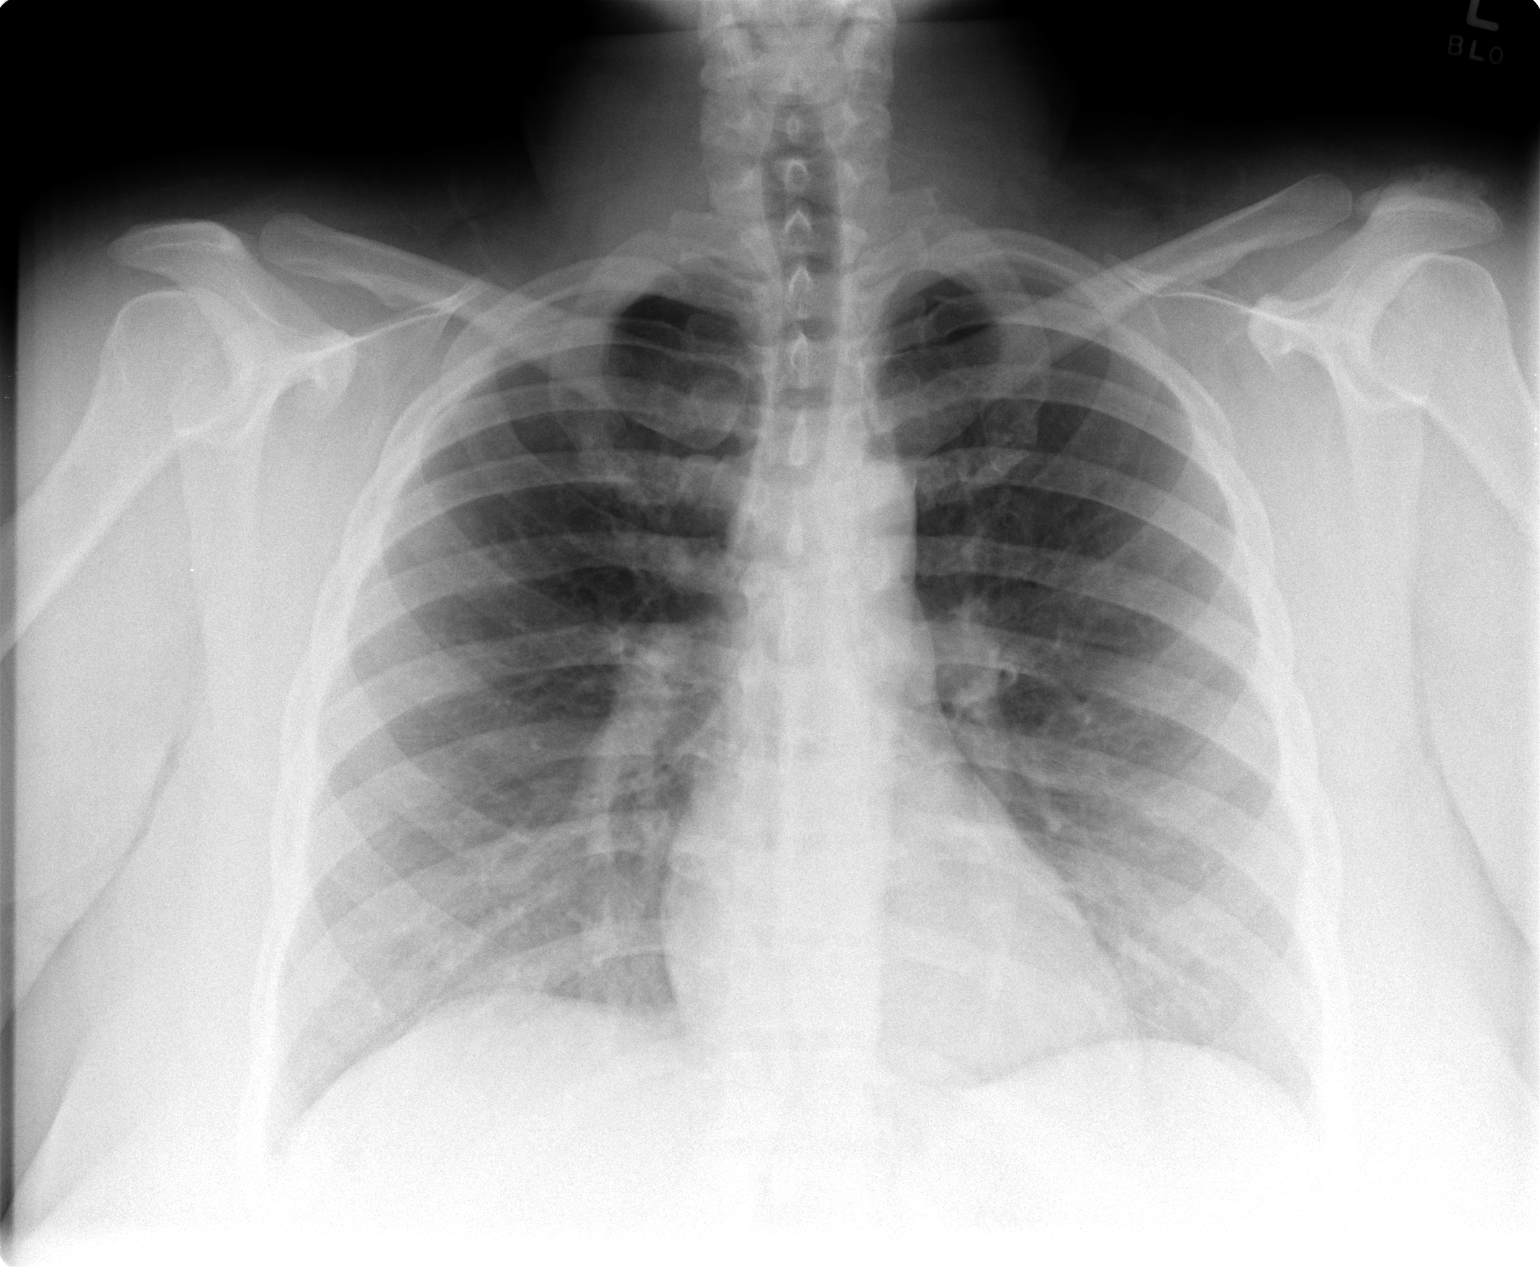

[view not recorded (2 of 2)]
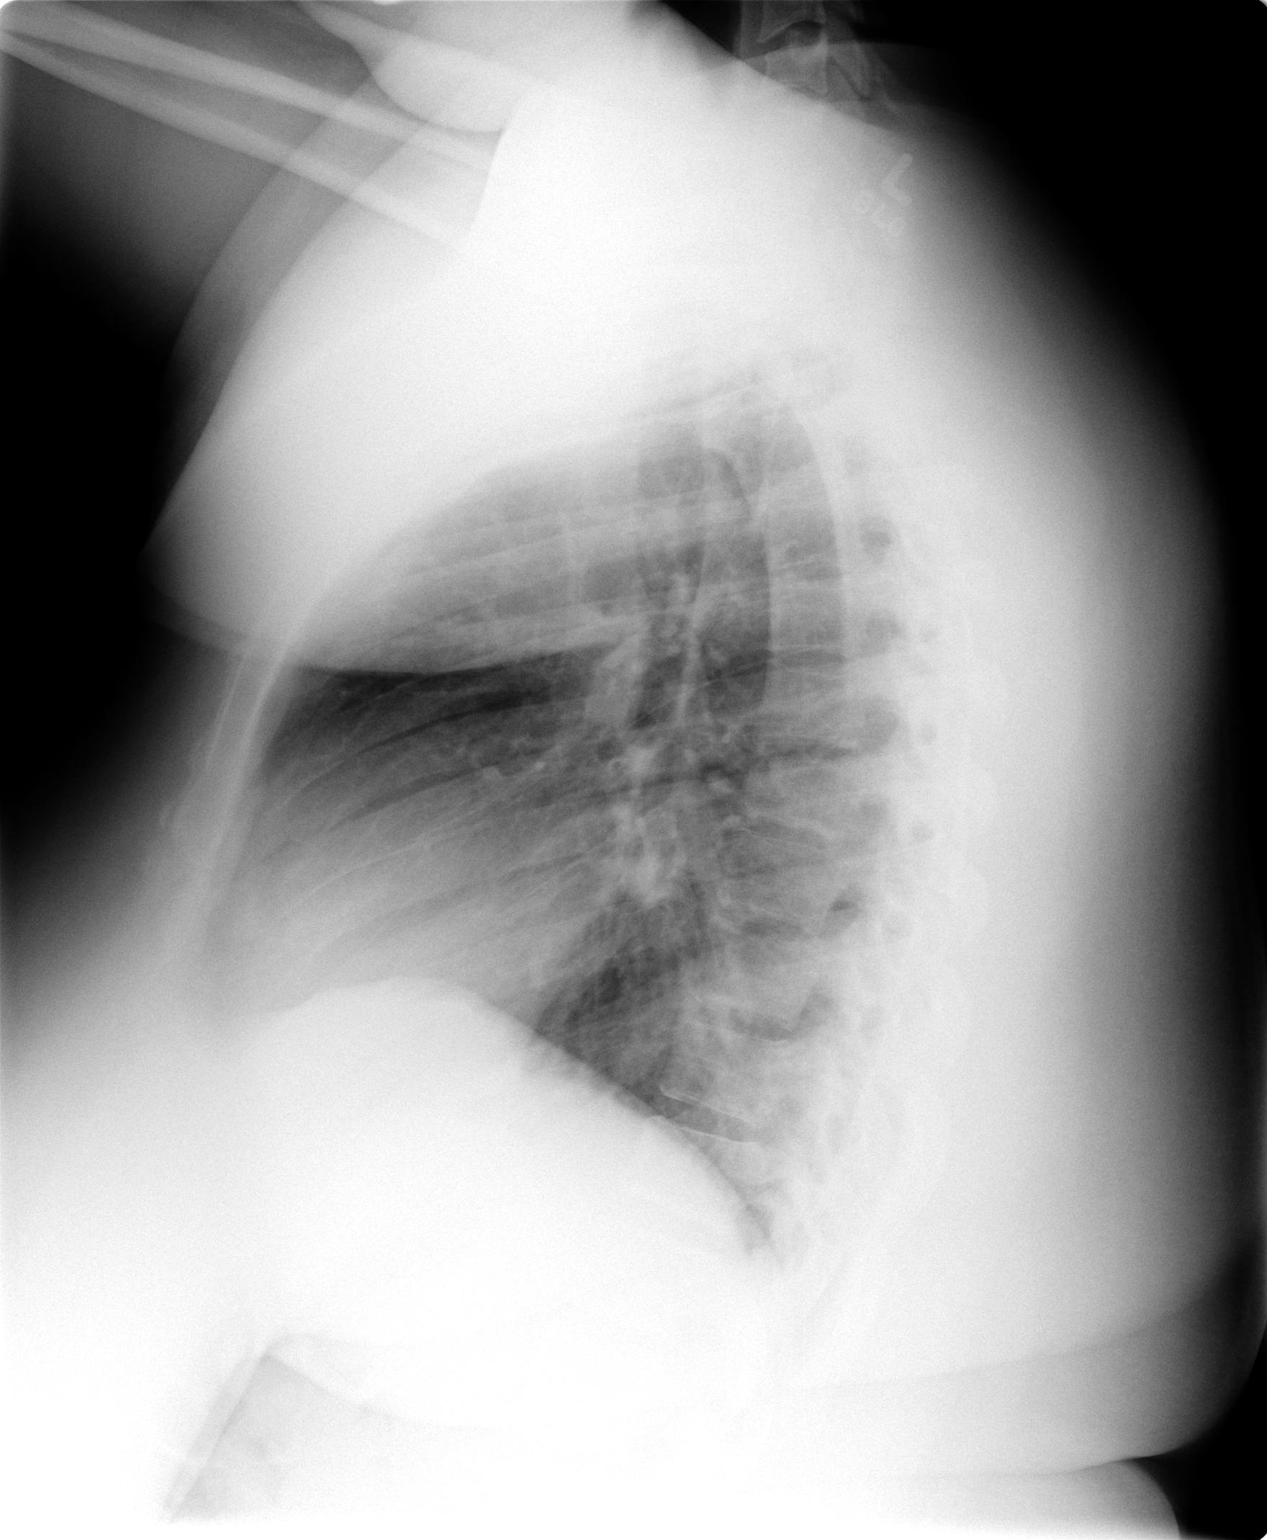

[2 of 2 positions shown; findings below may reference images not displayed]

The heart size and mediastinal contours are normal.  The lungs are clear.  The visualized skeleton is unremarkable. 
 IMPRESSION
 No active disease.

## 2005-02-01 ENCOUNTER — Emergency Department (HOSPITAL_COMMUNITY): Admission: EM | Admit: 2005-02-01 | Discharge: 2005-02-01 | Payer: Self-pay | Admitting: Emergency Medicine

## 2005-04-09 ENCOUNTER — Emergency Department (HOSPITAL_COMMUNITY): Admission: EM | Admit: 2005-04-09 | Discharge: 2005-04-09 | Payer: Self-pay | Admitting: Emergency Medicine

## 2005-04-10 ENCOUNTER — Emergency Department (HOSPITAL_COMMUNITY): Admission: EM | Admit: 2005-04-10 | Discharge: 2005-04-10 | Payer: Self-pay | Admitting: Emergency Medicine

## 2005-05-15 IMAGING — CR DG CHEST 2V
2 series · 2 of 2 positions shown · non-contrast
Comparison: 01/07/04.

CLINICAL DATA: Asthma, shortness of breath, hypertension.
 CHEST, TWO VIEWS, 04/29/04:

[view not recorded (1 of 2)]
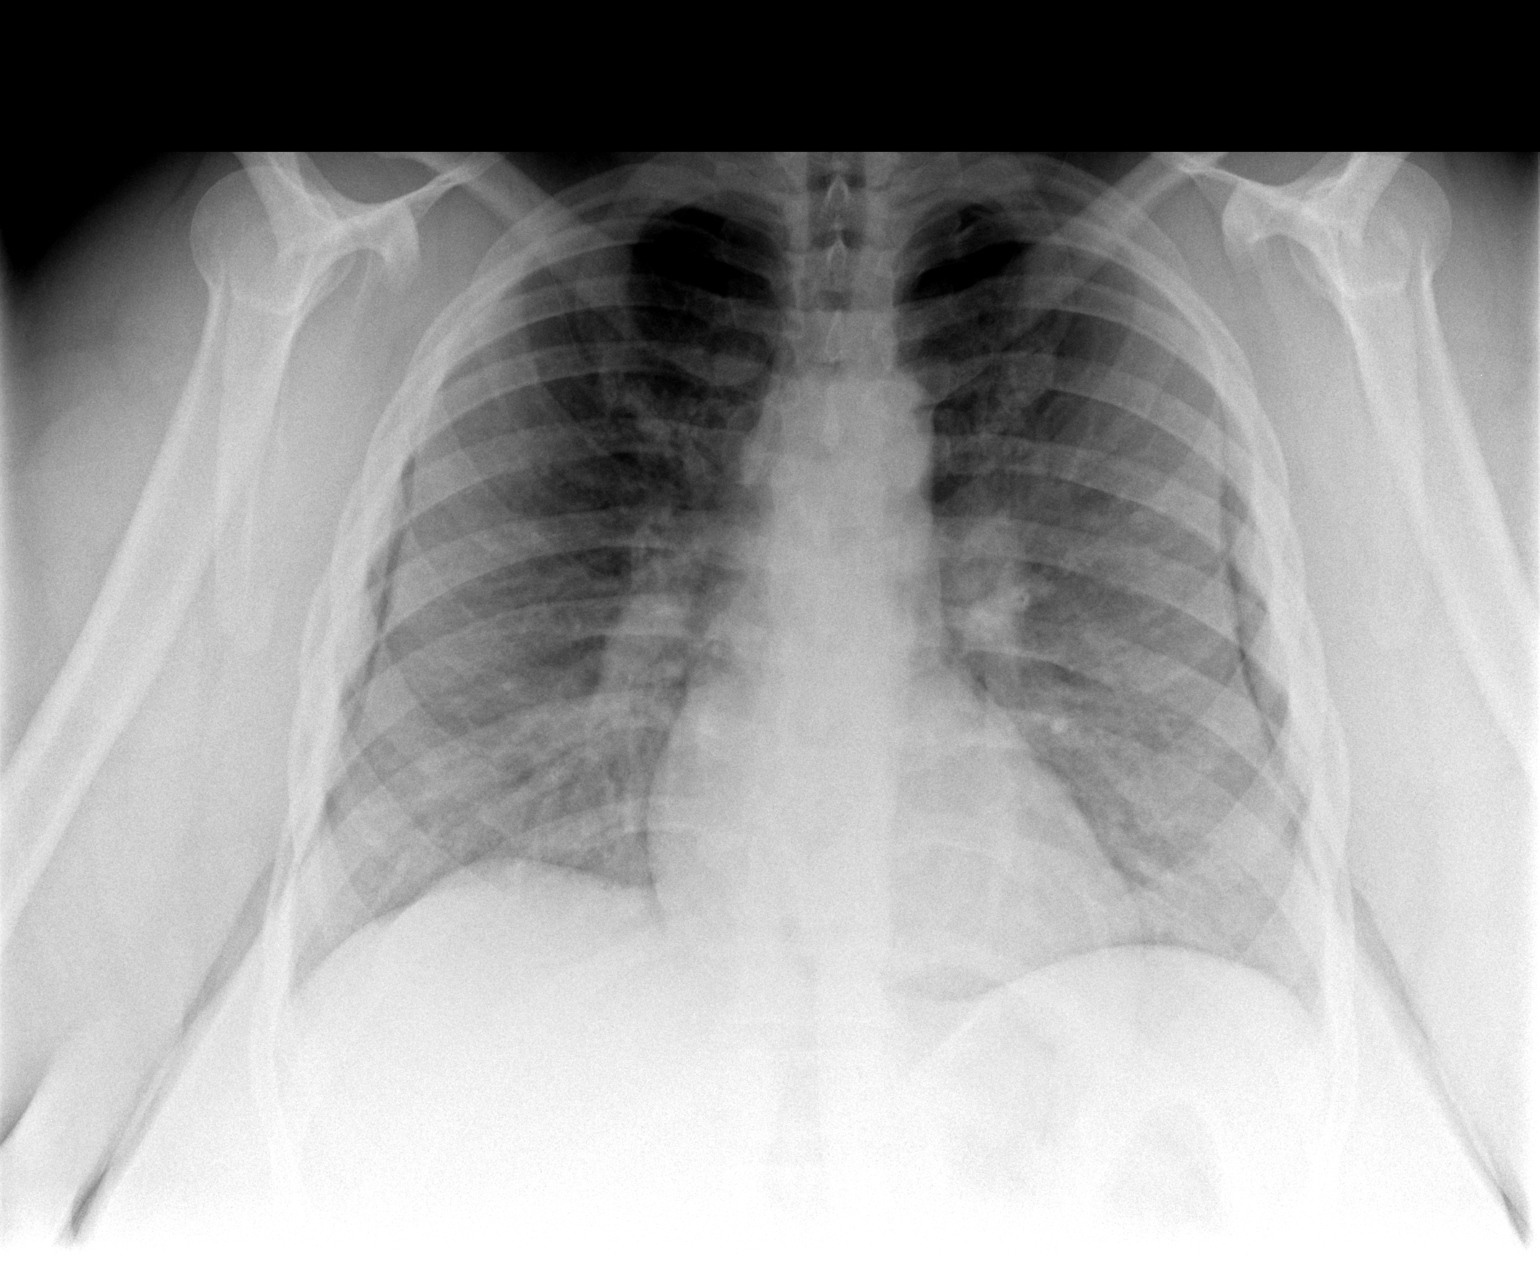

[view not recorded (2 of 2)]
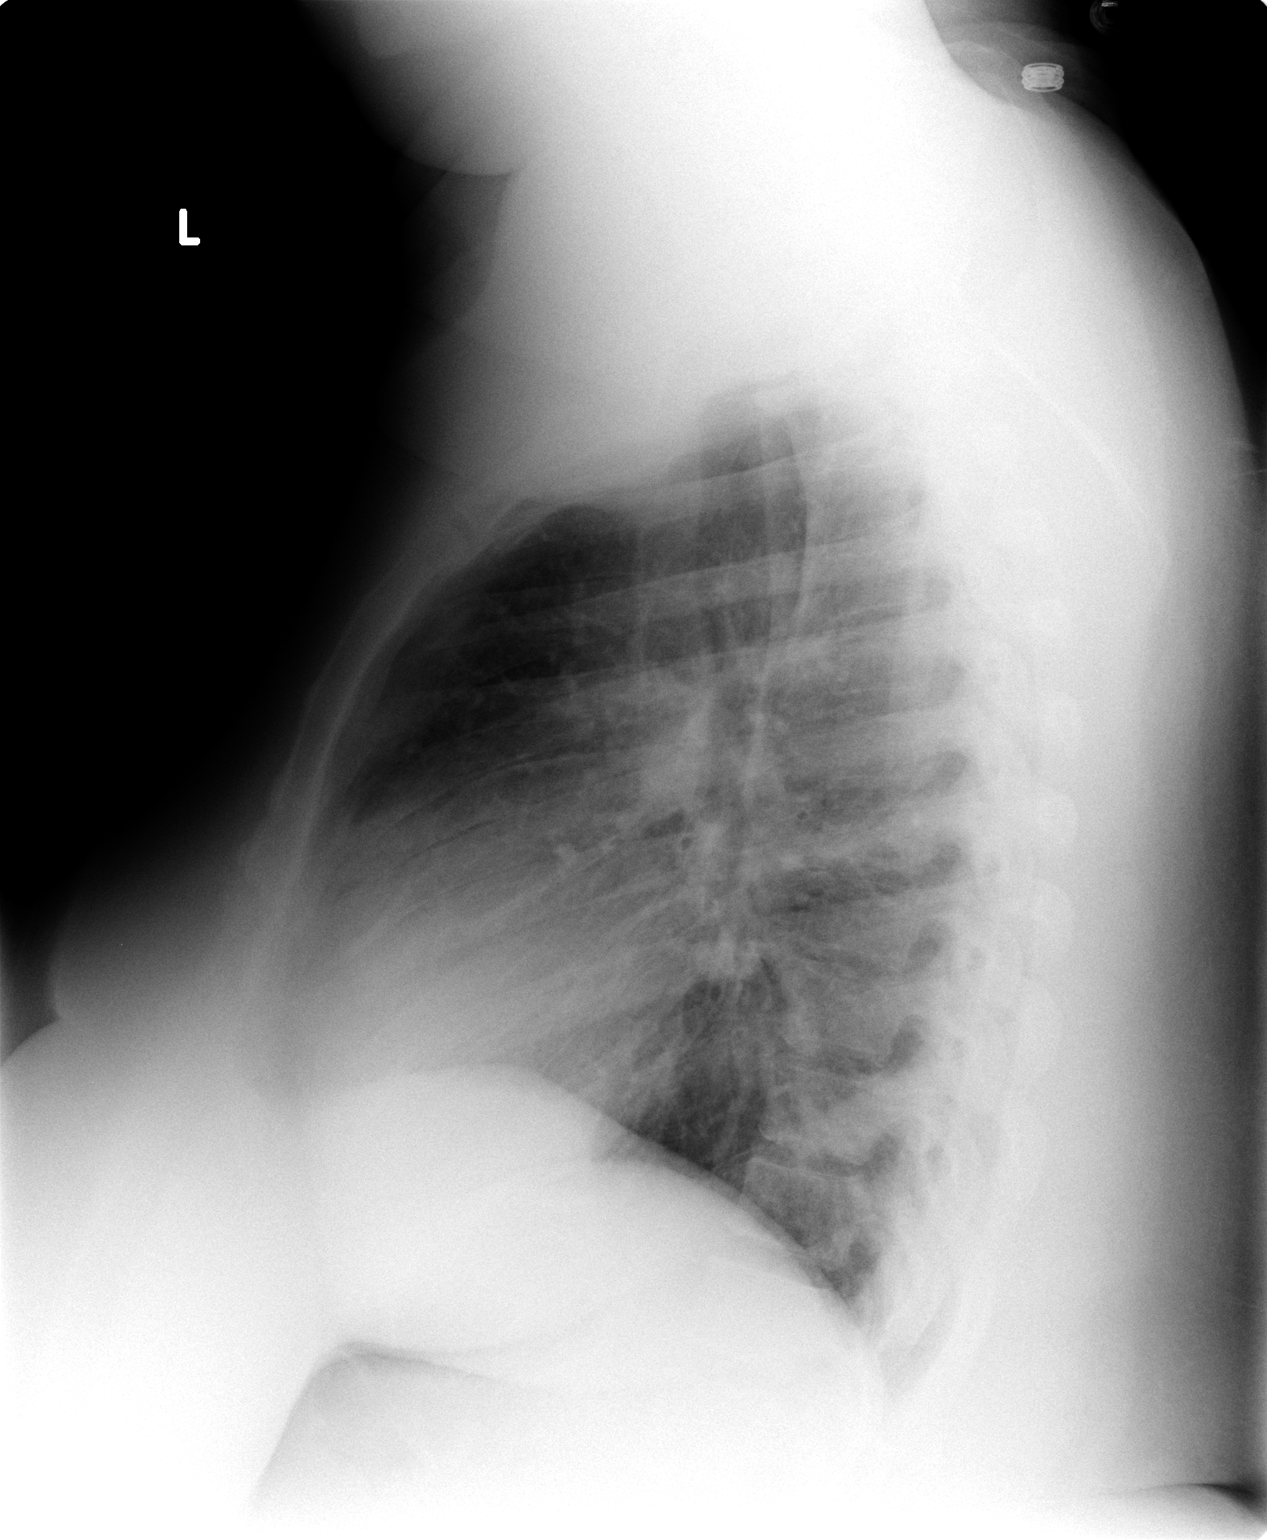

[2 of 2 positions shown; findings below may reference images not displayed]

FINDINGS: Airway thickening is present consistent with bronchitis or reactive airways disease.  No significant airspace opacity.  Heart and mediastinum appear unremarkable.
IMPRESSION: Airway thickening consistent with reactive airways disease.

## 2005-08-28 ENCOUNTER — Encounter: Payer: Self-pay | Admitting: Emergency Medicine

## 2005-10-17 ENCOUNTER — Ambulatory Visit: Payer: Self-pay | Admitting: Internal Medicine

## 2005-11-28 ENCOUNTER — Emergency Department (HOSPITAL_COMMUNITY): Admission: EM | Admit: 2005-11-28 | Discharge: 2005-11-28 | Payer: Self-pay | Admitting: Emergency Medicine

## 2005-11-29 ENCOUNTER — Ambulatory Visit: Payer: Self-pay | Admitting: Internal Medicine

## 2005-11-29 ENCOUNTER — Inpatient Hospital Stay (HOSPITAL_COMMUNITY): Admission: AD | Admit: 2005-11-29 | Discharge: 2005-11-30 | Payer: Self-pay | Admitting: Internal Medicine

## 2005-12-18 ENCOUNTER — Ambulatory Visit: Payer: Self-pay | Admitting: Internal Medicine

## 2005-12-26 ENCOUNTER — Ambulatory Visit: Payer: Self-pay | Admitting: Internal Medicine

## 2005-12-26 ENCOUNTER — Ambulatory Visit (HOSPITAL_COMMUNITY): Admission: RE | Admit: 2005-12-26 | Discharge: 2005-12-26 | Payer: Self-pay | Admitting: Internal Medicine

## 2006-01-17 ENCOUNTER — Ambulatory Visit: Payer: Self-pay | Admitting: Hospitalist

## 2006-03-11 ENCOUNTER — Ambulatory Visit: Payer: Self-pay | Admitting: Hospitalist

## 2006-03-11 ENCOUNTER — Encounter (INDEPENDENT_AMBULATORY_CARE_PROVIDER_SITE_OTHER): Payer: Self-pay | Admitting: *Deleted

## 2006-03-11 LAB — CONVERTED CEMR LAB
CO2: 25 meq/L (ref 19–32)
Calcium: 9.2 mg/dL (ref 8.4–10.5)
Creatinine, Ser: 0.69 mg/dL (ref 0.40–1.20)
Sodium: 138 meq/L (ref 135–145)

## 2006-03-25 ENCOUNTER — Encounter (INDEPENDENT_AMBULATORY_CARE_PROVIDER_SITE_OTHER): Payer: Self-pay | Admitting: *Deleted

## 2006-03-25 ENCOUNTER — Ambulatory Visit: Payer: Self-pay | Admitting: Internal Medicine

## 2006-03-25 LAB — CONVERTED CEMR LAB
BUN: 10 mg/dL (ref 6–23)
Chloride: 104 meq/L (ref 96–112)
Potassium: 3.3 meq/L — ABNORMAL LOW (ref 3.5–5.3)
Sodium: 134 meq/L — ABNORMAL LOW (ref 135–145)

## 2006-04-10 ENCOUNTER — Encounter (INDEPENDENT_AMBULATORY_CARE_PROVIDER_SITE_OTHER): Payer: Self-pay | Admitting: *Deleted

## 2006-04-10 ENCOUNTER — Ambulatory Visit: Payer: Self-pay | Admitting: Internal Medicine

## 2006-04-10 DIAGNOSIS — Z87891 Personal history of nicotine dependence: Secondary | ICD-10-CM | POA: Insufficient documentation

## 2006-04-10 DIAGNOSIS — M25539 Pain in unspecified wrist: Secondary | ICD-10-CM | POA: Insufficient documentation

## 2006-04-10 DIAGNOSIS — F339 Major depressive disorder, recurrent, unspecified: Secondary | ICD-10-CM | POA: Insufficient documentation

## 2006-04-15 ENCOUNTER — Encounter (INDEPENDENT_AMBULATORY_CARE_PROVIDER_SITE_OTHER): Payer: Self-pay | Admitting: *Deleted

## 2006-04-15 ENCOUNTER — Ambulatory Visit: Payer: Self-pay | Admitting: Internal Medicine

## 2006-04-24 ENCOUNTER — Encounter (INDEPENDENT_AMBULATORY_CARE_PROVIDER_SITE_OTHER): Payer: Self-pay | Admitting: *Deleted

## 2006-04-24 ENCOUNTER — Ambulatory Visit: Payer: Self-pay | Admitting: Internal Medicine

## 2006-04-24 ENCOUNTER — Ambulatory Visit (HOSPITAL_COMMUNITY): Admission: RE | Admit: 2006-04-24 | Discharge: 2006-04-24 | Payer: Self-pay | Admitting: Internal Medicine

## 2006-04-24 LAB — CONVERTED CEMR LAB
Hemoglobin, Urine: NEGATIVE
Leukocytes, UA: NEGATIVE
Nitrite: NEGATIVE
Specific Gravity, Urine: 1.019 (ref 1.005–1.03)
TSH: 3.189 microintl units/mL (ref 0.350–5.50)
Urine Glucose: NEGATIVE mg/dL
pH: 8 (ref 5.0–8.0)

## 2006-04-28 ENCOUNTER — Ambulatory Visit: Payer: Self-pay | Admitting: Internal Medicine

## 2006-04-28 ENCOUNTER — Encounter (INDEPENDENT_AMBULATORY_CARE_PROVIDER_SITE_OTHER): Payer: Self-pay | Admitting: Dermatology

## 2006-05-01 ENCOUNTER — Encounter: Admission: RE | Admit: 2006-05-01 | Discharge: 2006-07-30 | Payer: Self-pay | Admitting: *Deleted

## 2006-06-05 ENCOUNTER — Emergency Department (HOSPITAL_COMMUNITY): Admission: EM | Admit: 2006-06-05 | Discharge: 2006-06-05 | Payer: Self-pay | Admitting: Family Medicine

## 2006-06-17 ENCOUNTER — Ambulatory Visit: Payer: Self-pay | Admitting: Internal Medicine

## 2006-06-17 DIAGNOSIS — Z6841 Body Mass Index (BMI) 40.0 and over, adult: Secondary | ICD-10-CM

## 2006-06-17 DIAGNOSIS — G4700 Insomnia, unspecified: Secondary | ICD-10-CM | POA: Insufficient documentation

## 2006-06-17 DIAGNOSIS — R109 Unspecified abdominal pain: Secondary | ICD-10-CM | POA: Insufficient documentation

## 2006-06-18 ENCOUNTER — Telehealth (INDEPENDENT_AMBULATORY_CARE_PROVIDER_SITE_OTHER): Payer: Self-pay | Admitting: *Deleted

## 2006-07-24 ENCOUNTER — Ambulatory Visit: Payer: Self-pay | Admitting: *Deleted

## 2006-07-24 ENCOUNTER — Encounter (INDEPENDENT_AMBULATORY_CARE_PROVIDER_SITE_OTHER): Payer: Self-pay | Admitting: Internal Medicine

## 2006-07-31 ENCOUNTER — Ambulatory Visit: Payer: Self-pay | Admitting: Internal Medicine

## 2006-07-31 DIAGNOSIS — I10 Essential (primary) hypertension: Secondary | ICD-10-CM | POA: Insufficient documentation

## 2006-09-02 ENCOUNTER — Ambulatory Visit: Payer: Self-pay | Admitting: Internal Medicine

## 2006-09-02 DIAGNOSIS — N920 Excessive and frequent menstruation with regular cycle: Secondary | ICD-10-CM | POA: Insufficient documentation

## 2006-09-02 DIAGNOSIS — J45901 Unspecified asthma with (acute) exacerbation: Secondary | ICD-10-CM | POA: Insufficient documentation

## 2006-09-04 ENCOUNTER — Telehealth (INDEPENDENT_AMBULATORY_CARE_PROVIDER_SITE_OTHER): Payer: Self-pay | Admitting: *Deleted

## 2006-09-05 ENCOUNTER — Telehealth (INDEPENDENT_AMBULATORY_CARE_PROVIDER_SITE_OTHER): Payer: Self-pay | Admitting: Pharmacy Technician

## 2006-09-19 ENCOUNTER — Emergency Department (HOSPITAL_COMMUNITY): Admission: EM | Admit: 2006-09-19 | Discharge: 2006-09-19 | Payer: Self-pay | Admitting: Family Medicine

## 2006-10-23 ENCOUNTER — Emergency Department (HOSPITAL_COMMUNITY): Admission: EM | Admit: 2006-10-23 | Discharge: 2006-10-23 | Payer: Self-pay | Admitting: Emergency Medicine

## 2006-12-02 ENCOUNTER — Emergency Department (HOSPITAL_COMMUNITY): Admission: EM | Admit: 2006-12-02 | Discharge: 2006-12-02 | Payer: Self-pay | Admitting: Emergency Medicine

## 2006-12-15 IMAGING — CR DG CHEST 2V
2 series · 2 of 2 positions shown · non-contrast
Comparison: 04/29/04.

CLINICAL DATA: Asthma.  Shortness of breath.  Smoker.  
 CHEST - 2 VIEW:

[w chest pa]
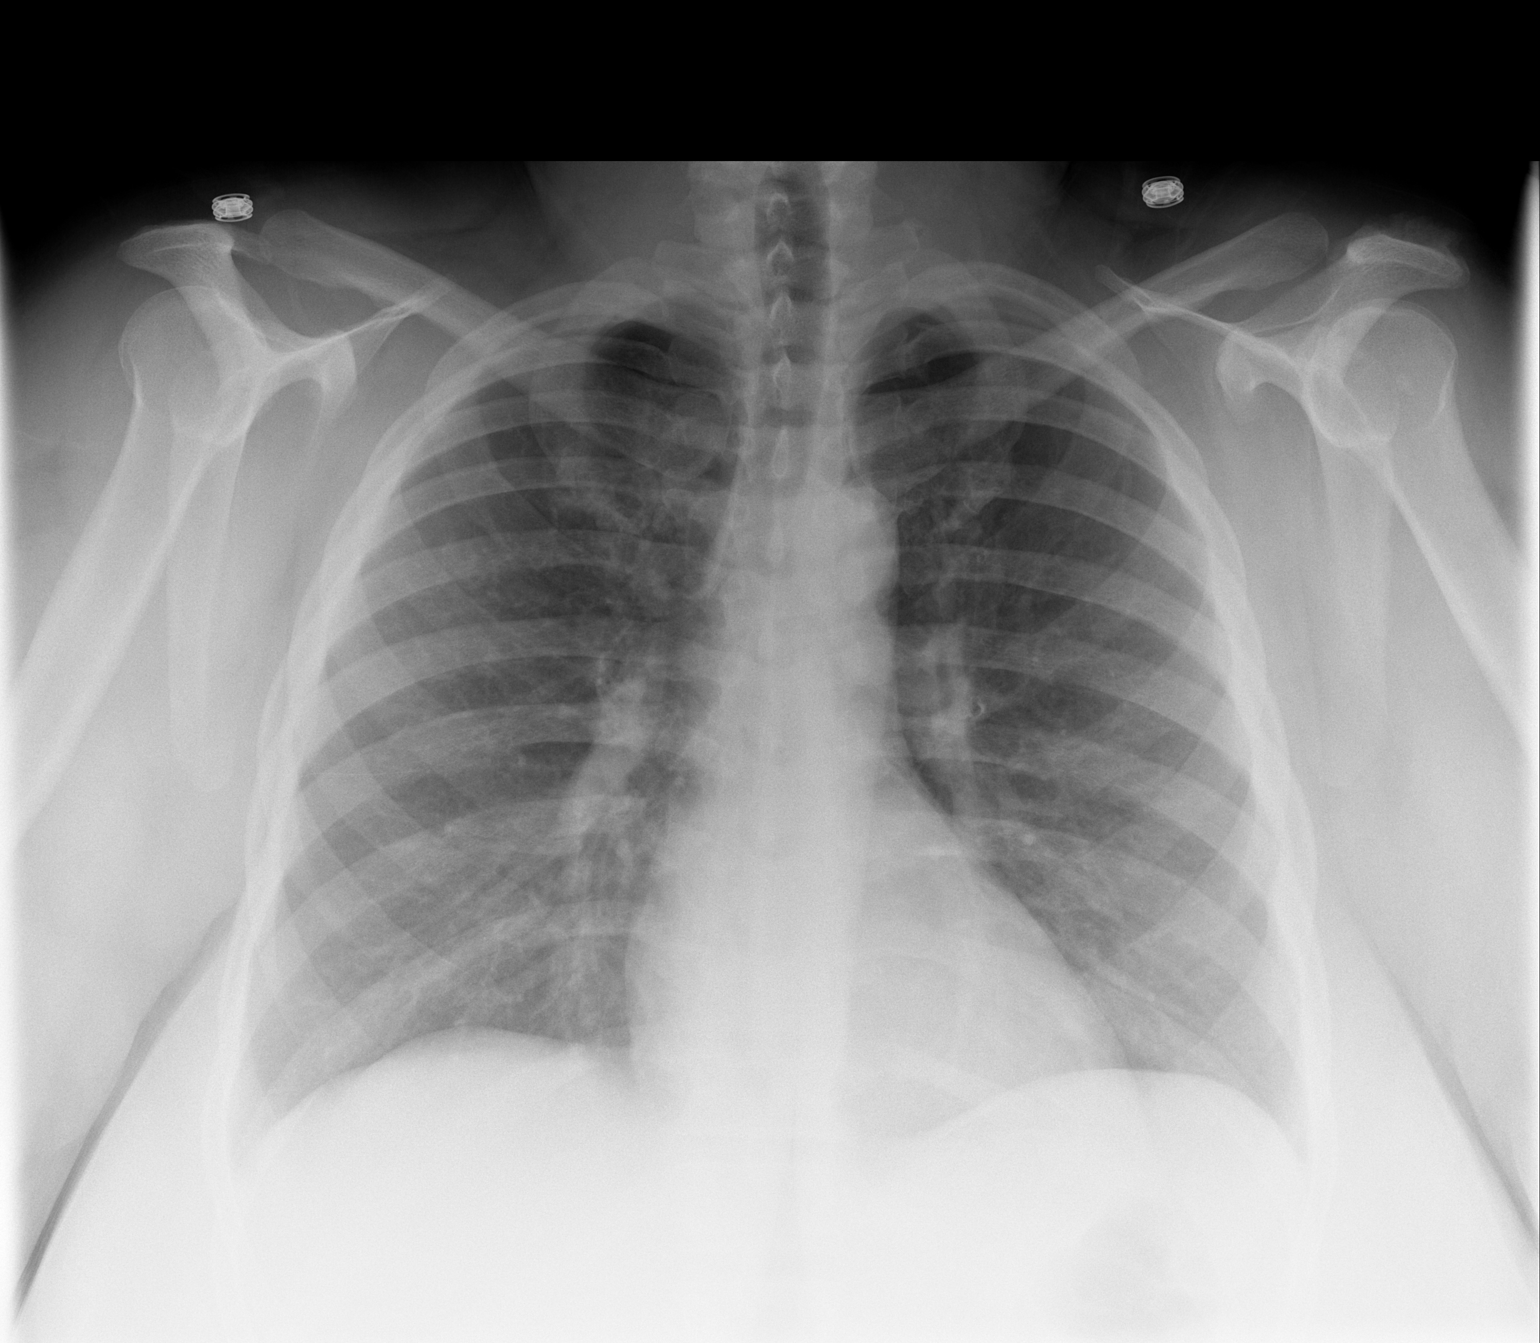

[w chest lat]
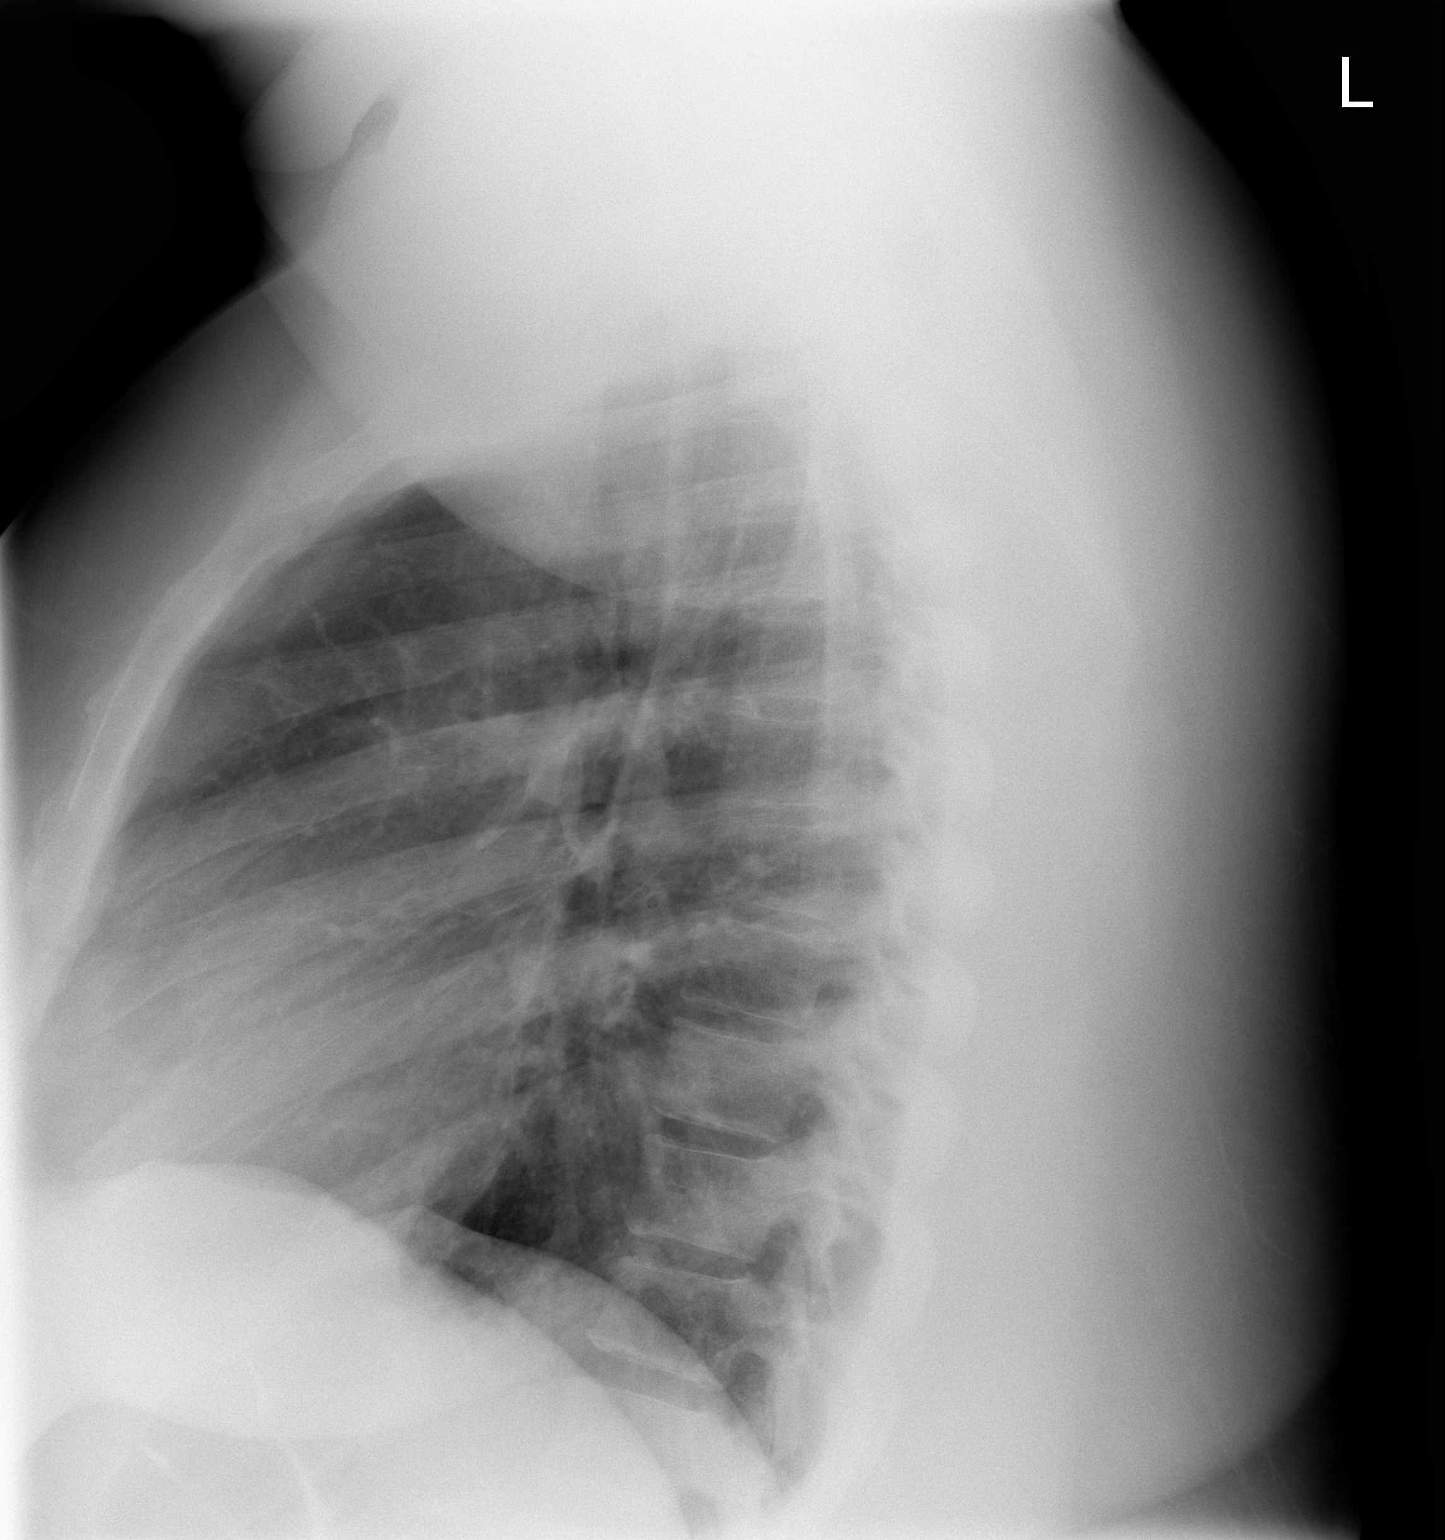

[2 of 2 positions shown; findings below may reference images not displayed]

FINDINGS: Mild chronic peribronchial thickening bilaterally.  Mild hyperaeration of the lungs.  No focal infiltrate or atelectasis.  Cardiac size and shape normal.
IMPRESSION: Radiographic findings compatible with asthma.  No acute infiltrate/atelectasis.

## 2007-01-11 IMAGING — CR DG WRIST COMPLETE 3+V*R*
4 series · 4 of 4 positions shown · non-contrast
Comparison: None.

RIGHT WRIST - 4  VIEW:

CLINICAL DATA: Wrist pain without a history of injury.

[x wrist pa right]
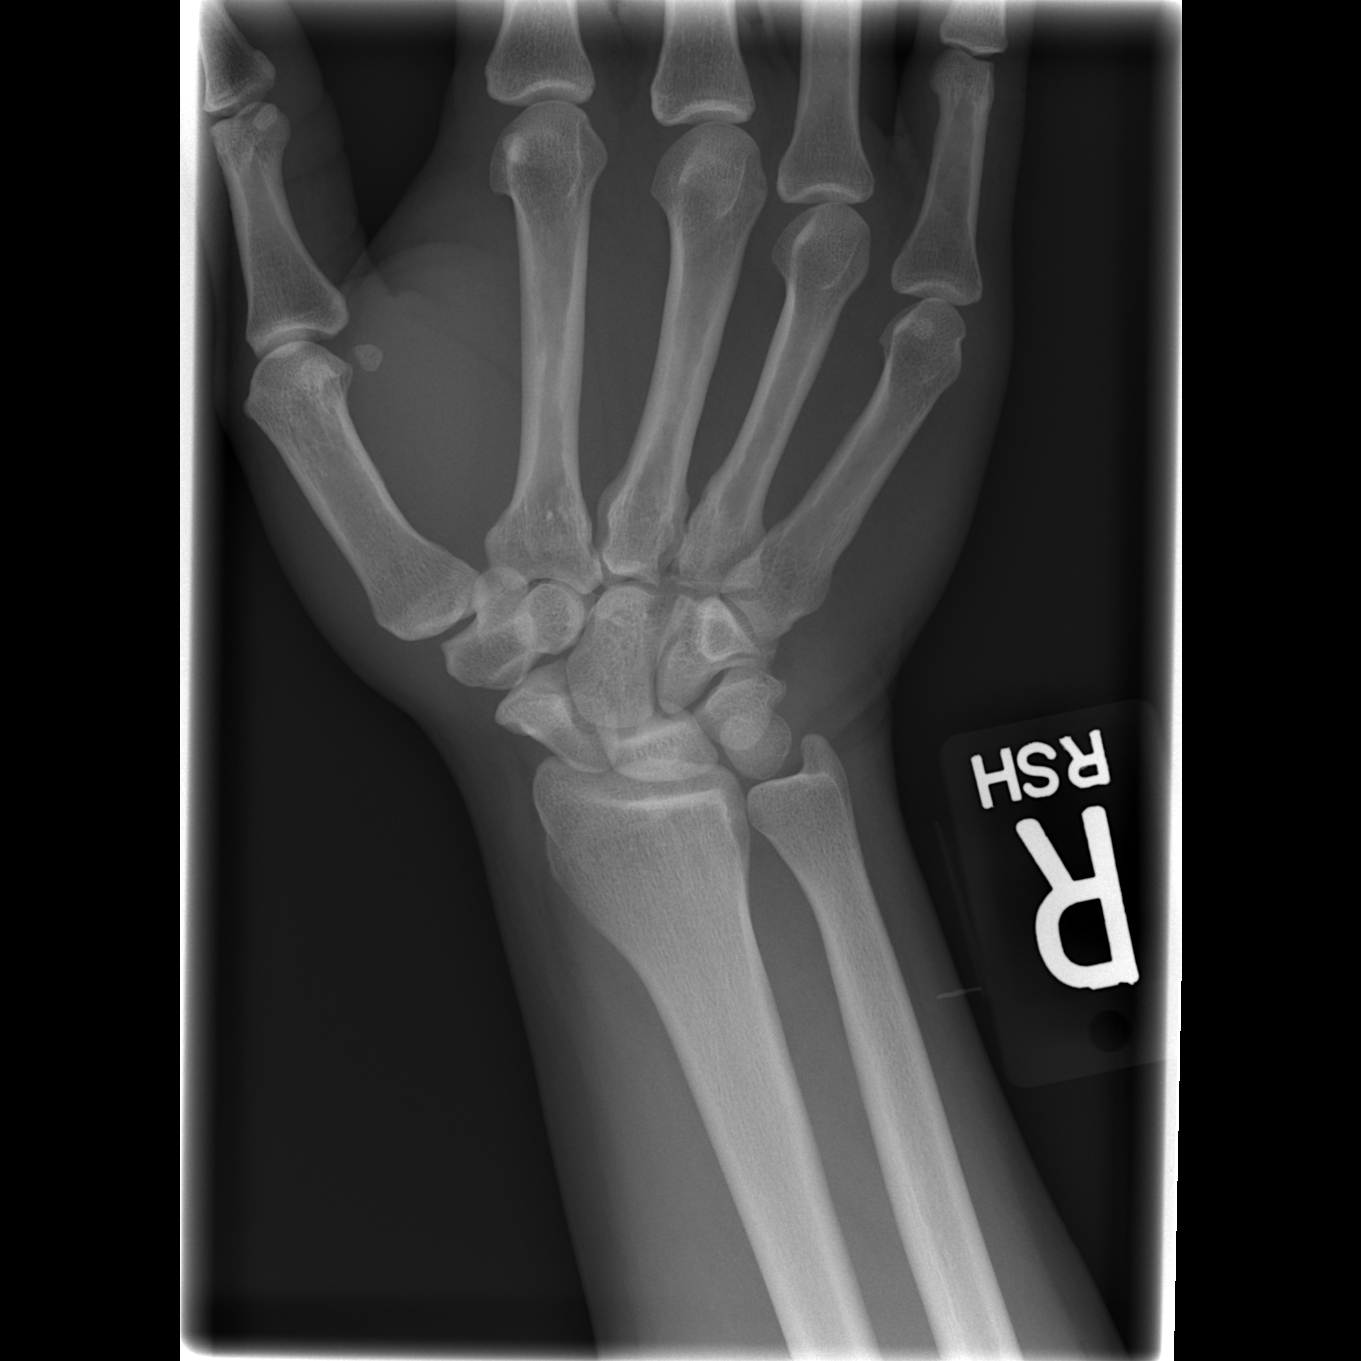

[x wrist obl right]
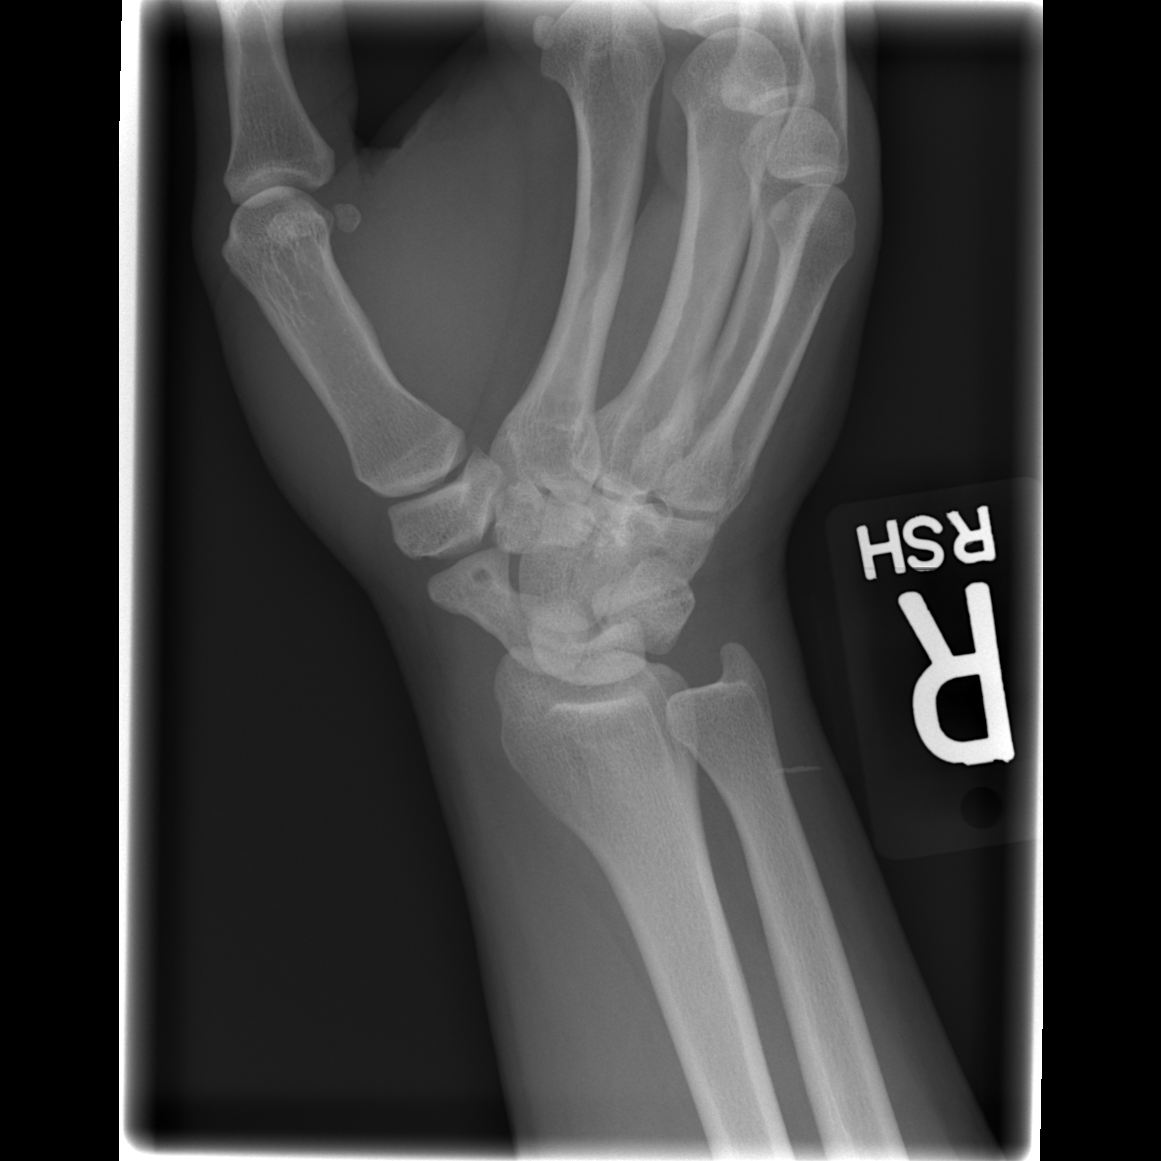

[x wrist lat right]
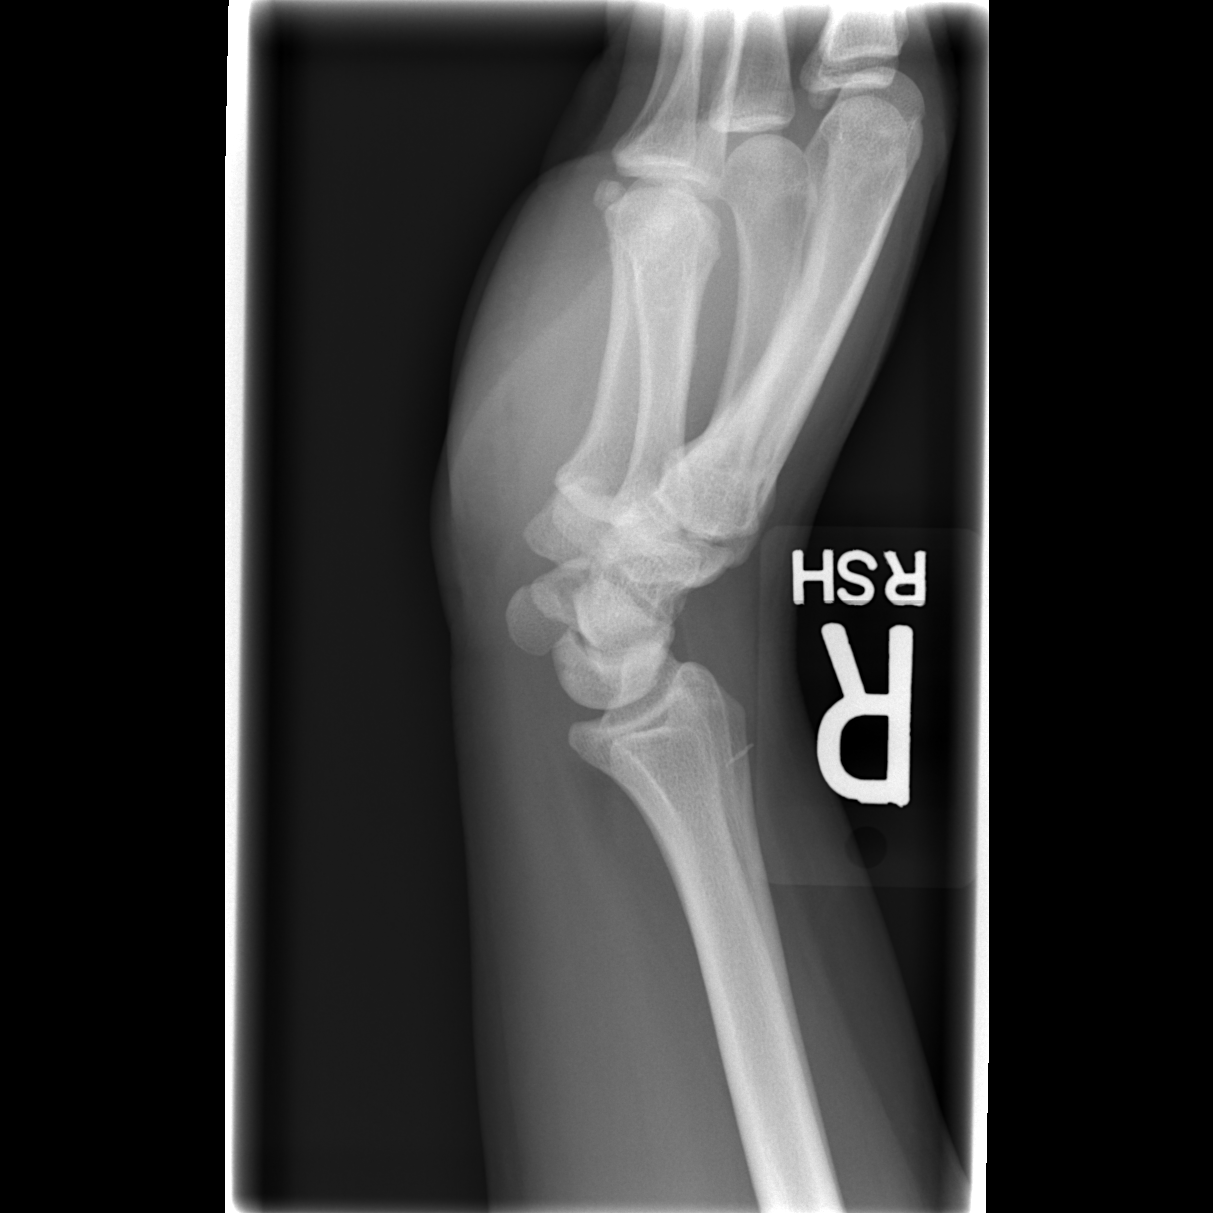

[x navicular]
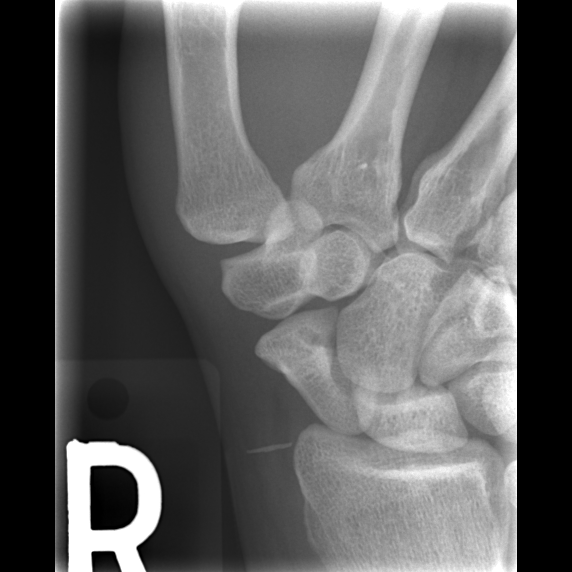

[4 of 4 positions shown; findings below may reference images not displayed]

FINDINGS: No evidence for acute fracture or subluxation.  Carpal alignment is
preserved.  Overlying soft tissues are unremarkable.
IMPRESSION: No acute bony abnormality.

## 2007-01-17 ENCOUNTER — Emergency Department (HOSPITAL_COMMUNITY): Admission: EM | Admit: 2007-01-17 | Discharge: 2007-01-17 | Payer: Self-pay | Admitting: Family Medicine

## 2007-03-09 ENCOUNTER — Encounter (INDEPENDENT_AMBULATORY_CARE_PROVIDER_SITE_OTHER): Payer: Self-pay | Admitting: *Deleted

## 2007-03-09 ENCOUNTER — Ambulatory Visit: Payer: Self-pay | Admitting: Internal Medicine

## 2007-03-11 ENCOUNTER — Telehealth: Payer: Self-pay | Admitting: *Deleted

## 2007-03-12 ENCOUNTER — Encounter (INDEPENDENT_AMBULATORY_CARE_PROVIDER_SITE_OTHER): Payer: Self-pay | Admitting: *Deleted

## 2007-03-12 ENCOUNTER — Ambulatory Visit: Payer: Self-pay | Admitting: Internal Medicine

## 2007-03-17 ENCOUNTER — Ambulatory Visit: Payer: Self-pay | Admitting: *Deleted

## 2007-03-31 ENCOUNTER — Ambulatory Visit (HOSPITAL_BASED_OUTPATIENT_CLINIC_OR_DEPARTMENT_OTHER): Admission: RE | Admit: 2007-03-31 | Discharge: 2007-03-31 | Payer: Self-pay | Admitting: *Deleted

## 2007-03-31 ENCOUNTER — Encounter: Payer: Self-pay | Admitting: Internal Medicine

## 2007-04-07 ENCOUNTER — Emergency Department (HOSPITAL_COMMUNITY): Admission: EM | Admit: 2007-04-07 | Discharge: 2007-04-07 | Payer: Self-pay | Admitting: Emergency Medicine

## 2007-04-11 ENCOUNTER — Emergency Department (HOSPITAL_COMMUNITY): Admission: EM | Admit: 2007-04-11 | Discharge: 2007-04-11 | Payer: Self-pay | Admitting: Emergency Medicine

## 2007-04-13 ENCOUNTER — Ambulatory Visit: Payer: Self-pay | Admitting: Infectious Diseases

## 2007-04-13 ENCOUNTER — Telehealth: Payer: Self-pay | Admitting: *Deleted

## 2007-04-17 ENCOUNTER — Emergency Department (HOSPITAL_COMMUNITY): Admission: EM | Admit: 2007-04-17 | Discharge: 2007-04-17 | Payer: Self-pay | Admitting: Family Medicine

## 2007-04-21 ENCOUNTER — Emergency Department (HOSPITAL_COMMUNITY): Admission: EM | Admit: 2007-04-21 | Discharge: 2007-04-21 | Payer: Self-pay | Admitting: Family Medicine

## 2007-04-27 ENCOUNTER — Emergency Department (HOSPITAL_COMMUNITY): Admission: EM | Admit: 2007-04-27 | Discharge: 2007-04-27 | Payer: Self-pay | Admitting: Family Medicine

## 2007-05-02 ENCOUNTER — Ambulatory Visit: Payer: Self-pay | Admitting: Internal Medicine

## 2007-05-05 ENCOUNTER — Ambulatory Visit: Payer: Self-pay | Admitting: Internal Medicine

## 2007-05-07 ENCOUNTER — Encounter (INDEPENDENT_AMBULATORY_CARE_PROVIDER_SITE_OTHER): Payer: Self-pay | Admitting: *Deleted

## 2007-05-07 ENCOUNTER — Ambulatory Visit: Payer: Self-pay | Admitting: Pulmonary Disease

## 2007-05-11 IMAGING — CR DG LUMBAR SPINE COMPLETE 4+V
5 series · 5 of 5 positions shown · non-contrast
Comparison: none

CLINICAL DATA: Back pain without trauma

Thoracic spine 2 view:
No previous for comparison. Degenerative spurring the lower  cervical spine.
Normal thoracic alignment. Negative for fracture, dislocation, or other acute
bone injury. No significant thoracic degenerative change.

[view not recorded (1 of 5)]
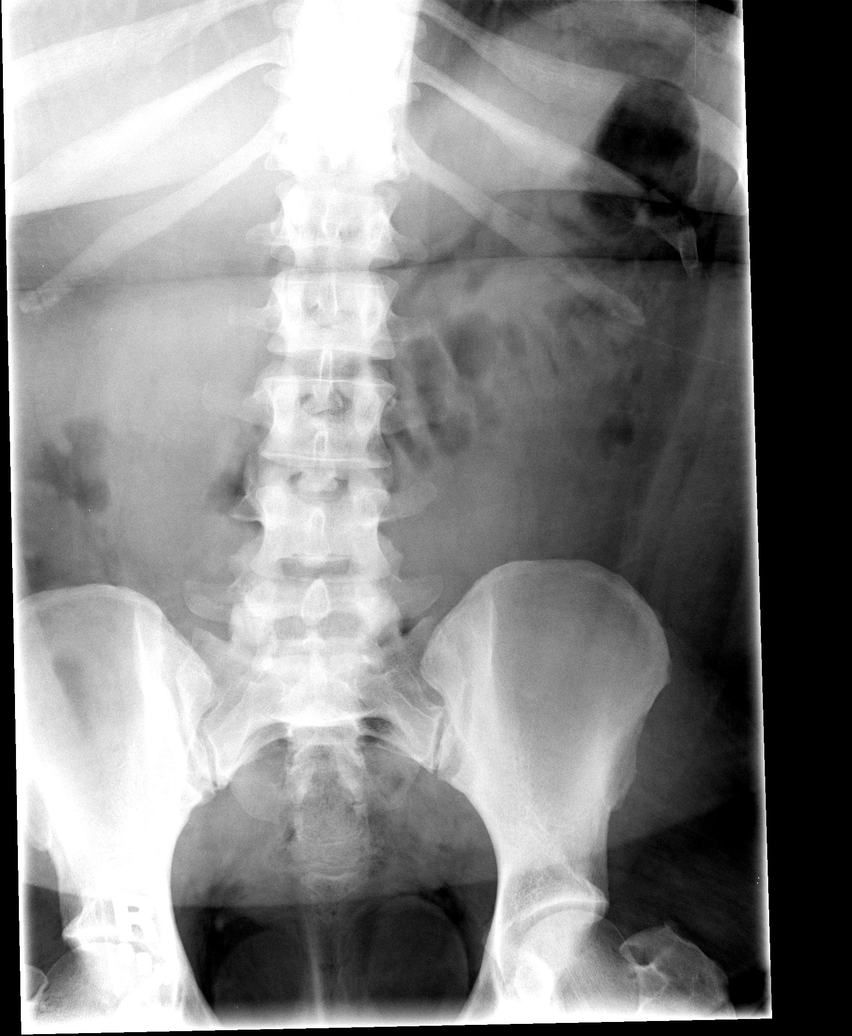

[view not recorded (2 of 5)]
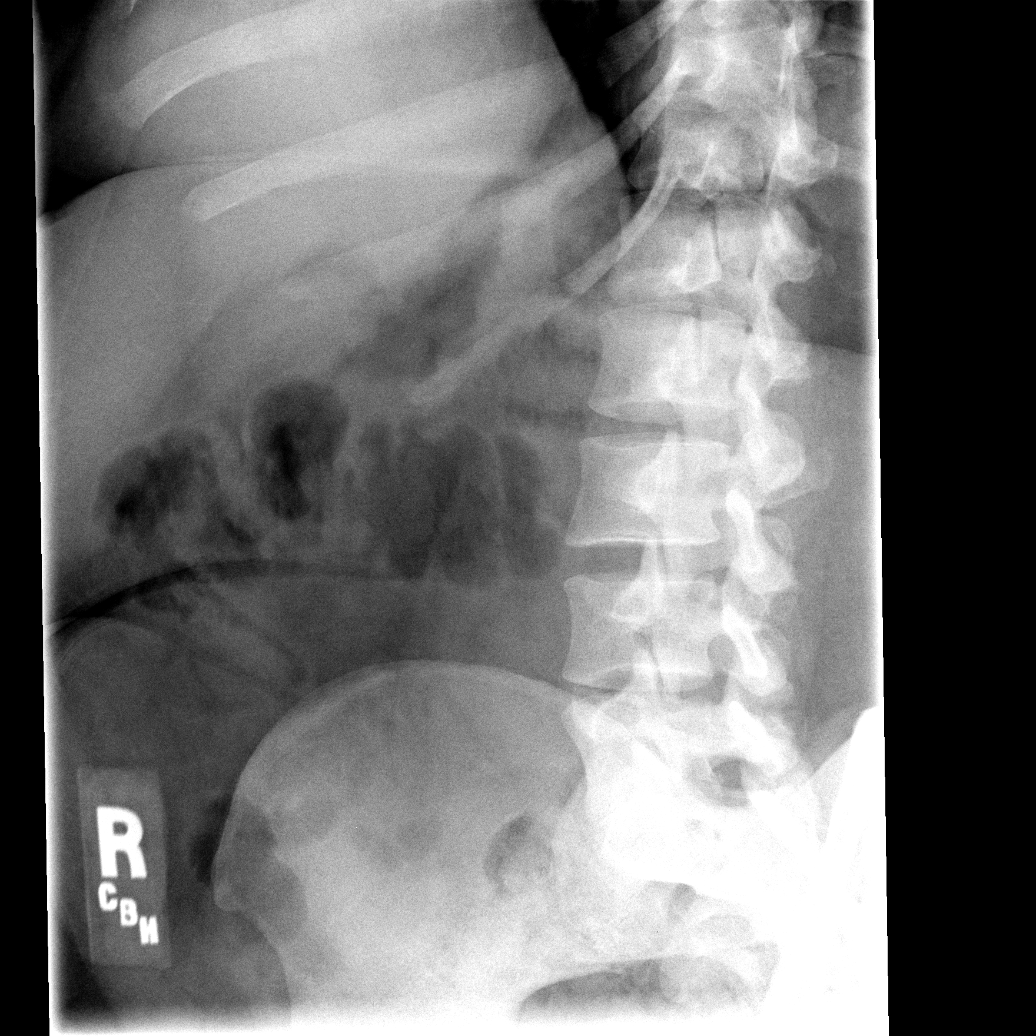

[view not recorded (3 of 5)]
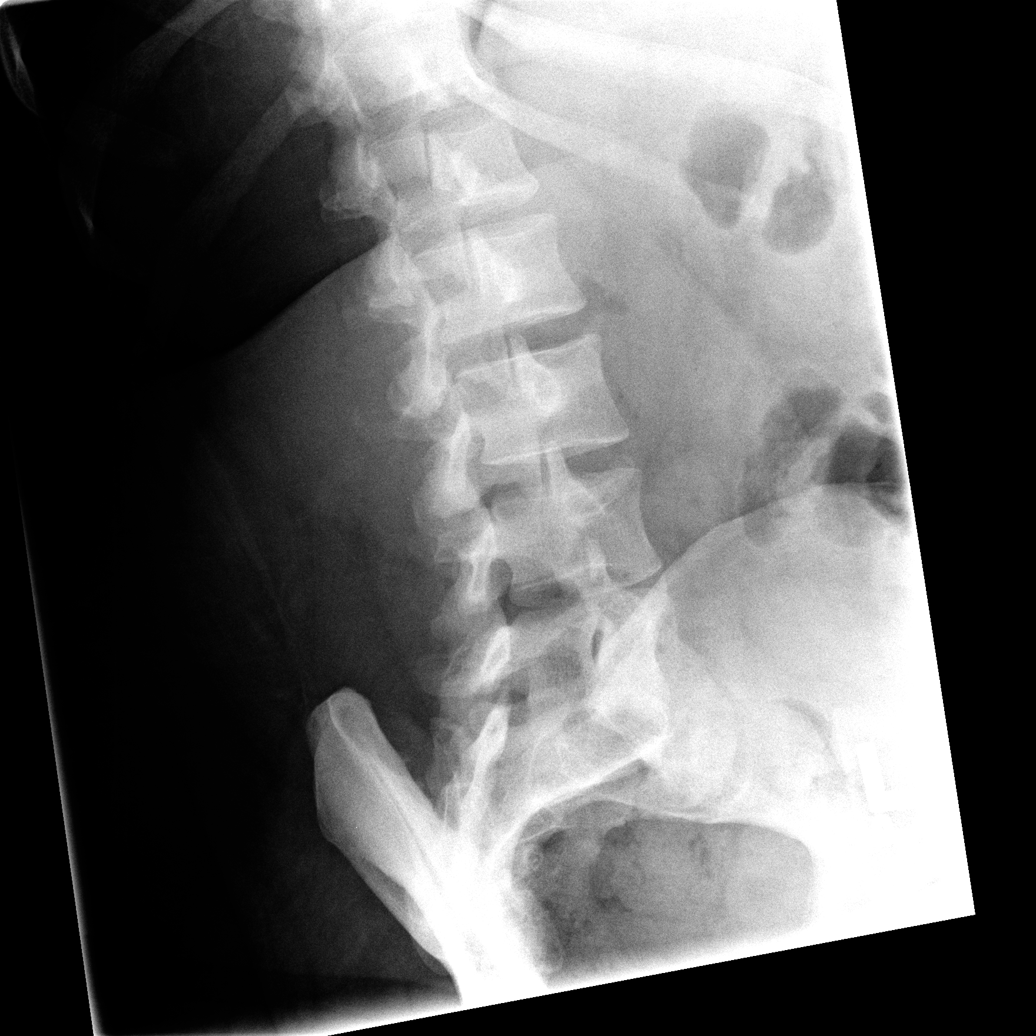

[view not recorded (4 of 5)]
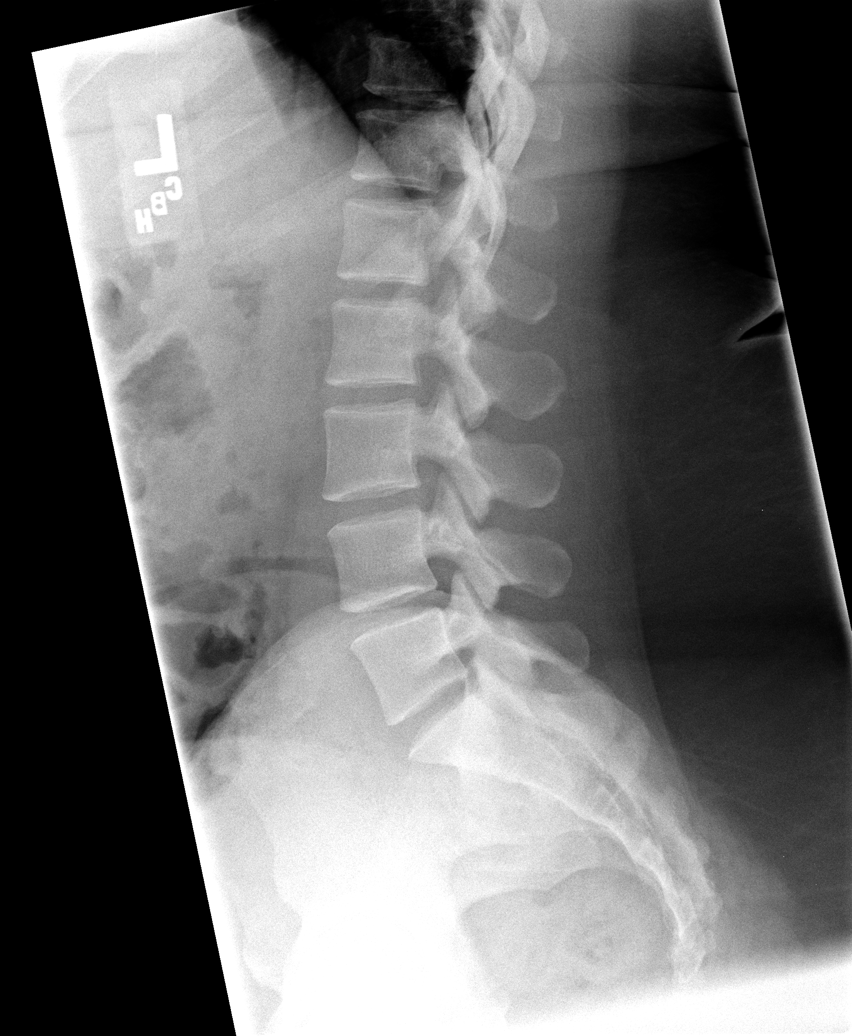

[view not recorded (5 of 5)]
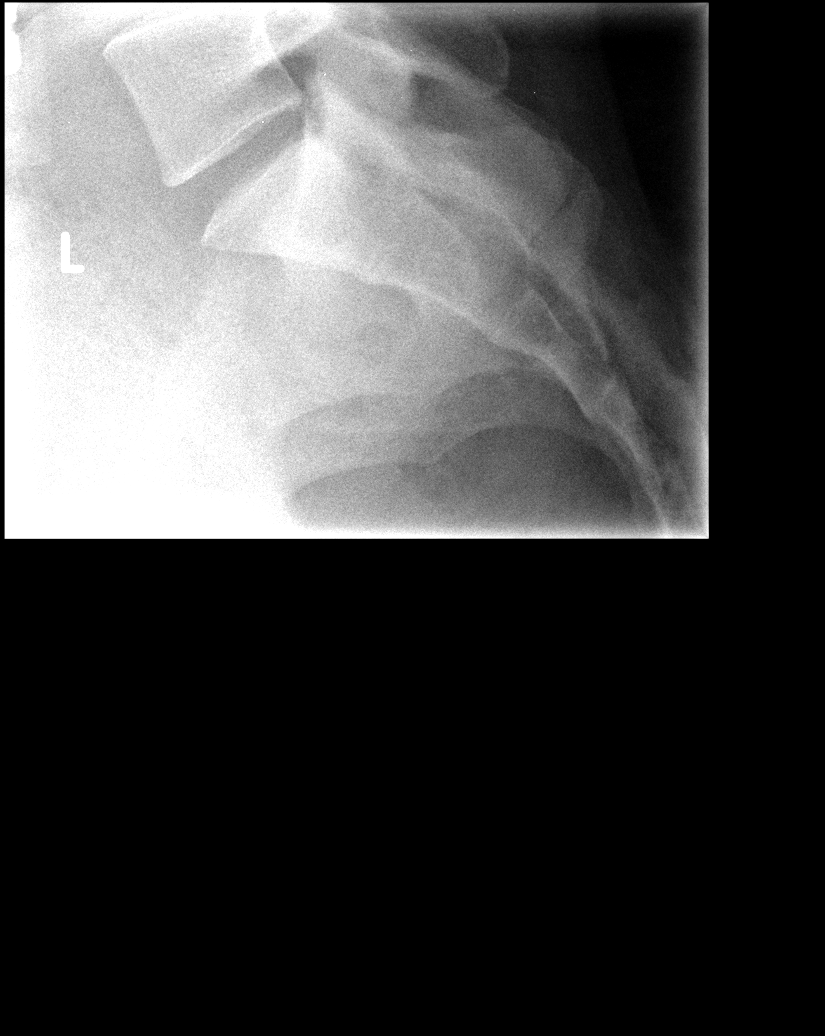

[5 of 5 positions shown; findings below may reference images not displayed]

IMPRESSION: 1. Negative thoracic spine.
2. Cervical spine spurs incidentally noted.

Lumbar spine five-view:

No previous for comparison.  There is no evidence of lumbar spine fracture. 
Alignment is normal.  Intervertebral disc spaces are maintained, and no other
significant bone abnormalities are identified.
IMPRESSION: Negative lumbar spine radiographs.

## 2007-05-11 IMAGING — CR DG THORACIC SPINE 2V
3 series · 3 of 3 positions shown · non-contrast
Comparison: none

CLINICAL DATA: Back pain without trauma

Thoracic spine 2 view:
No previous for comparison. Degenerative spurring the lower  cervical spine.
Normal thoracic alignment. Negative for fracture, dislocation, or other acute
bone injury. No significant thoracic degenerative change.

[view not recorded (1 of 3)]
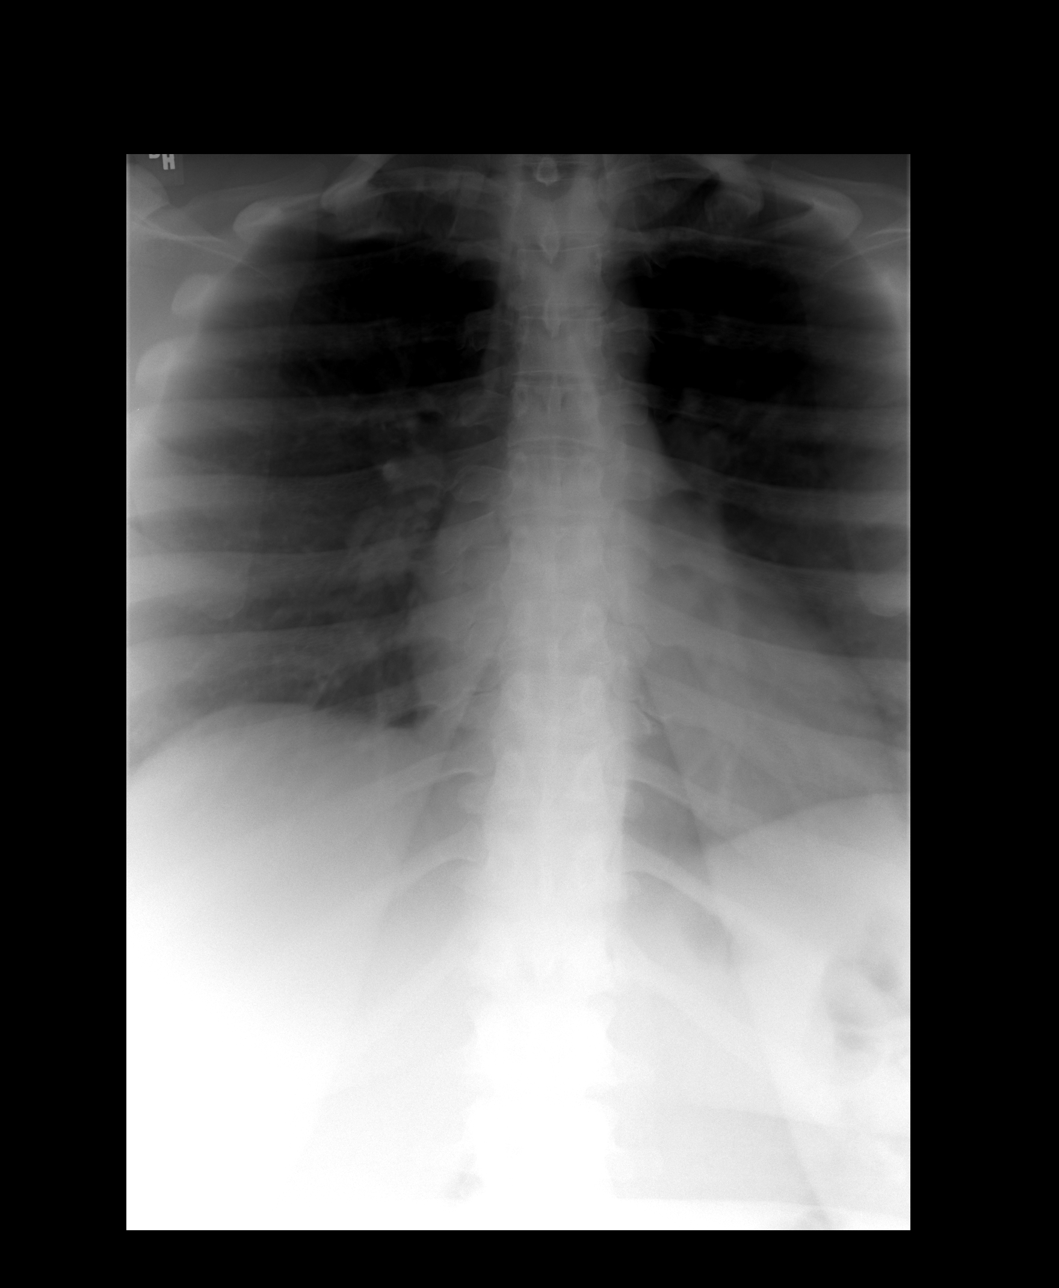

[view not recorded (2 of 3)]
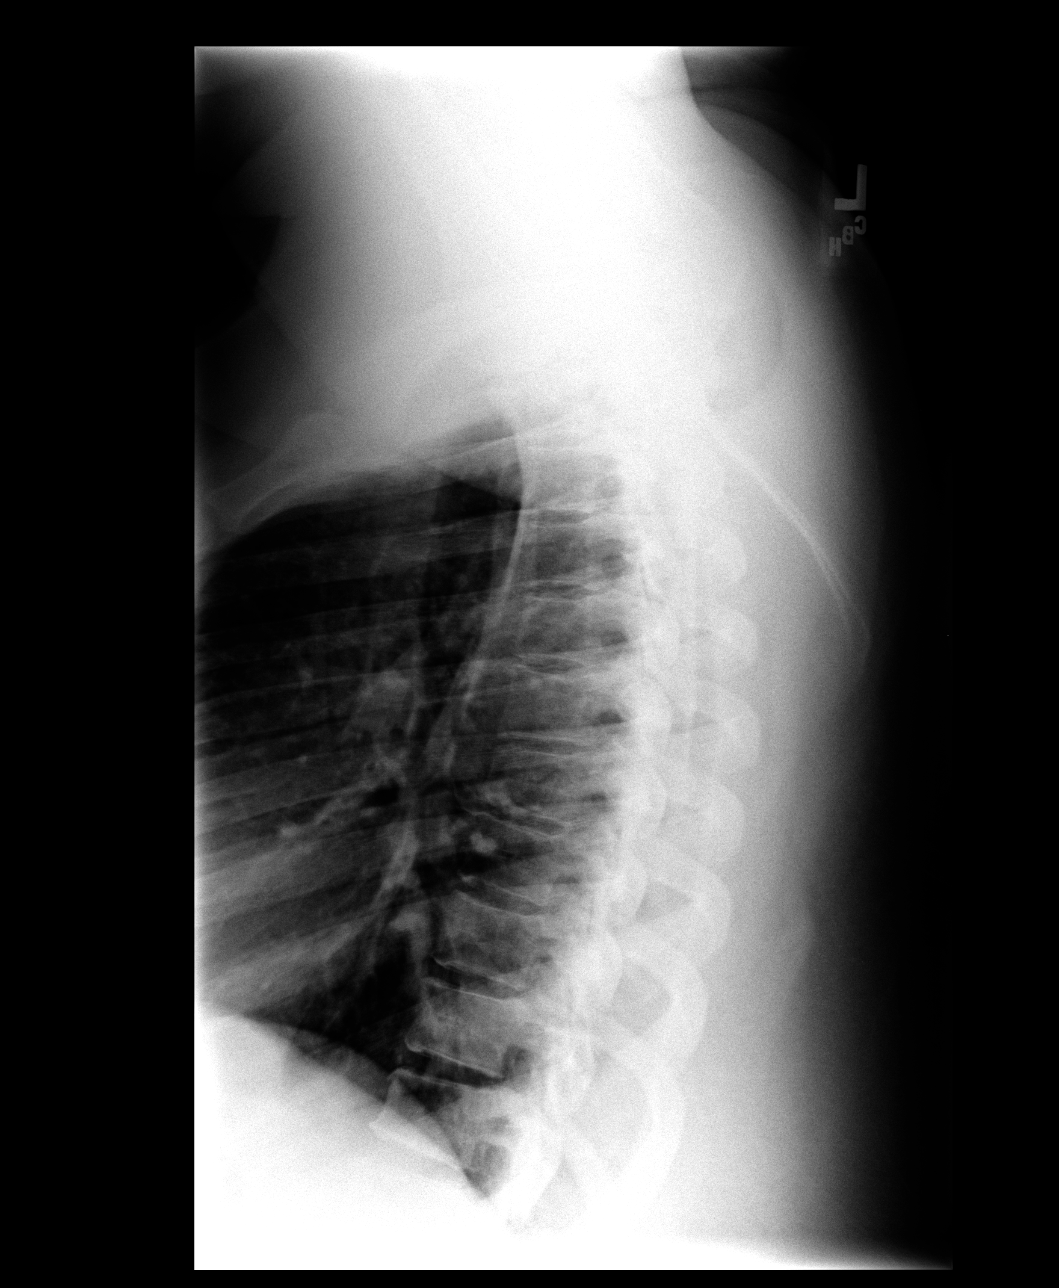

[view not recorded (3 of 3)]
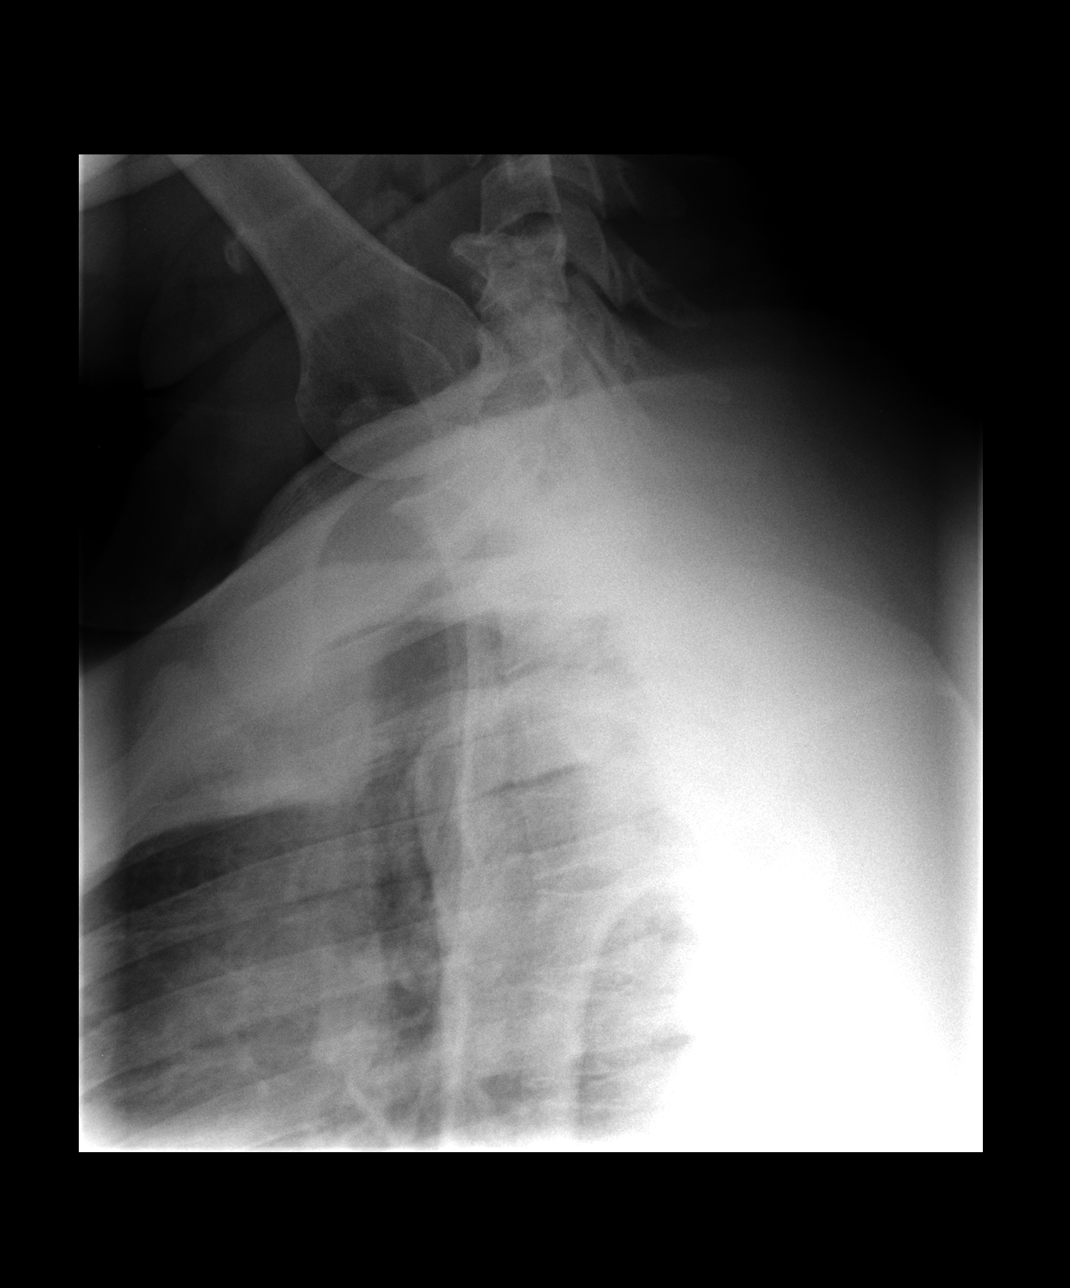

[3 of 3 positions shown; findings below may reference images not displayed]

IMPRESSION: 1. Negative thoracic spine.
2. Cervical spine spurs incidentally noted.

Lumbar spine five-view:

No previous for comparison.  There is no evidence of lumbar spine fracture. 
Alignment is normal.  Intervertebral disc spaces are maintained, and no other
significant bone abnormalities are identified.
IMPRESSION: Negative lumbar spine radiographs.

## 2007-05-22 ENCOUNTER — Ambulatory Visit: Payer: Self-pay | Admitting: Infectious Diseases

## 2007-05-22 ENCOUNTER — Observation Stay (HOSPITAL_COMMUNITY): Admission: EM | Admit: 2007-05-22 | Discharge: 2007-05-23 | Payer: Self-pay | Admitting: Emergency Medicine

## 2007-05-26 ENCOUNTER — Encounter (INDEPENDENT_AMBULATORY_CARE_PROVIDER_SITE_OTHER): Payer: Self-pay | Admitting: *Deleted

## 2007-05-27 ENCOUNTER — Ambulatory Visit: Payer: Self-pay | Admitting: Internal Medicine

## 2007-05-27 ENCOUNTER — Encounter (INDEPENDENT_AMBULATORY_CARE_PROVIDER_SITE_OTHER): Payer: Self-pay | Admitting: Internal Medicine

## 2007-06-05 ENCOUNTER — Ambulatory Visit: Payer: Self-pay | Admitting: Pulmonary Disease

## 2007-06-17 ENCOUNTER — Ambulatory Visit: Payer: Self-pay | Admitting: Internal Medicine

## 2007-06-17 ENCOUNTER — Encounter (INDEPENDENT_AMBULATORY_CARE_PROVIDER_SITE_OTHER): Payer: Self-pay | Admitting: *Deleted

## 2007-06-22 ENCOUNTER — Telehealth (INDEPENDENT_AMBULATORY_CARE_PROVIDER_SITE_OTHER): Payer: Self-pay | Admitting: *Deleted

## 2007-07-17 ENCOUNTER — Telehealth (INDEPENDENT_AMBULATORY_CARE_PROVIDER_SITE_OTHER): Payer: Self-pay | Admitting: *Deleted

## 2007-07-21 ENCOUNTER — Emergency Department (HOSPITAL_COMMUNITY): Admission: EM | Admit: 2007-07-21 | Discharge: 2007-07-21 | Payer: Self-pay | Admitting: Emergency Medicine

## 2007-08-03 ENCOUNTER — Telehealth (INDEPENDENT_AMBULATORY_CARE_PROVIDER_SITE_OTHER): Payer: Self-pay | Admitting: *Deleted

## 2007-08-05 ENCOUNTER — Telehealth (INDEPENDENT_AMBULATORY_CARE_PROVIDER_SITE_OTHER): Payer: Self-pay | Admitting: *Deleted

## 2007-09-02 ENCOUNTER — Emergency Department (HOSPITAL_COMMUNITY): Admission: EM | Admit: 2007-09-02 | Discharge: 2007-09-02 | Payer: Self-pay | Admitting: Family Medicine

## 2007-09-10 ENCOUNTER — Emergency Department (HOSPITAL_COMMUNITY): Admission: EM | Admit: 2007-09-10 | Discharge: 2007-09-10 | Payer: Self-pay | Admitting: Family Medicine

## 2007-10-21 ENCOUNTER — Emergency Department (HOSPITAL_COMMUNITY): Admission: EM | Admit: 2007-10-21 | Discharge: 2007-10-21 | Payer: Self-pay | Admitting: Family Medicine

## 2007-10-28 ENCOUNTER — Encounter (INDEPENDENT_AMBULATORY_CARE_PROVIDER_SITE_OTHER): Payer: Self-pay | Admitting: *Deleted

## 2007-10-28 ENCOUNTER — Ambulatory Visit: Payer: Self-pay | Admitting: Internal Medicine

## 2007-10-29 ENCOUNTER — Encounter (INDEPENDENT_AMBULATORY_CARE_PROVIDER_SITE_OTHER): Payer: Self-pay | Admitting: *Deleted

## 2007-10-29 LAB — CONVERTED CEMR LAB
ALT: 14 units/L (ref 0–35)
Basophils Absolute: 0 10*3/uL (ref 0.0–0.1)
CO2: 25 meq/L (ref 19–32)
Calcium: 8.7 mg/dL (ref 8.4–10.5)
Chloride: 107 meq/L (ref 96–112)
Creatinine, Ser: 0.82 mg/dL (ref 0.40–1.20)
Eosinophils Relative: 2 % (ref 0–5)
Glucose, Bld: 92 mg/dL (ref 70–99)
HCT: 37 % (ref 36.0–46.0)
Hemoglobin, Urine: NEGATIVE
Hemoglobin: 11.8 g/dL — ABNORMAL LOW (ref 12.0–15.0)
Lipase: 13 units/L (ref 0–75)
Lymphocytes Relative: 27 % (ref 12–46)
Lymphs Abs: 3.2 10*3/uL (ref 0.7–4.0)
Monocytes Absolute: 1.3 10*3/uL — ABNORMAL HIGH (ref 0.1–1.0)
Monocytes Relative: 11 % (ref 3–12)
Neutro Abs: 6.8 10*3/uL (ref 1.7–7.7)
Nitrite: NEGATIVE
RBC: 4.35 M/uL (ref 3.87–5.11)
Specific Gravity, Urine: 1.03 (ref 1.005–1.03)
Total Bilirubin: 0.3 mg/dL (ref 0.3–1.2)
Urobilinogen, UA: 0.2 (ref 0.0–1.0)
WBC: 11.6 10*3/uL — ABNORMAL HIGH (ref 4.0–10.5)
pH: 5.5 (ref 5.0–8.0)

## 2007-11-08 IMAGING — CR DG CHEST 2V
2 series · 2 of 2 positions shown · non-contrast
Comparison: Thoracic spine on 04/25/06 and a chest radiograph on 11/29/05.

CLINICAL DATA: Asthma and shortness of breath.
CHEST ? 2 VIEW:

[view not recorded (1 of 2)]
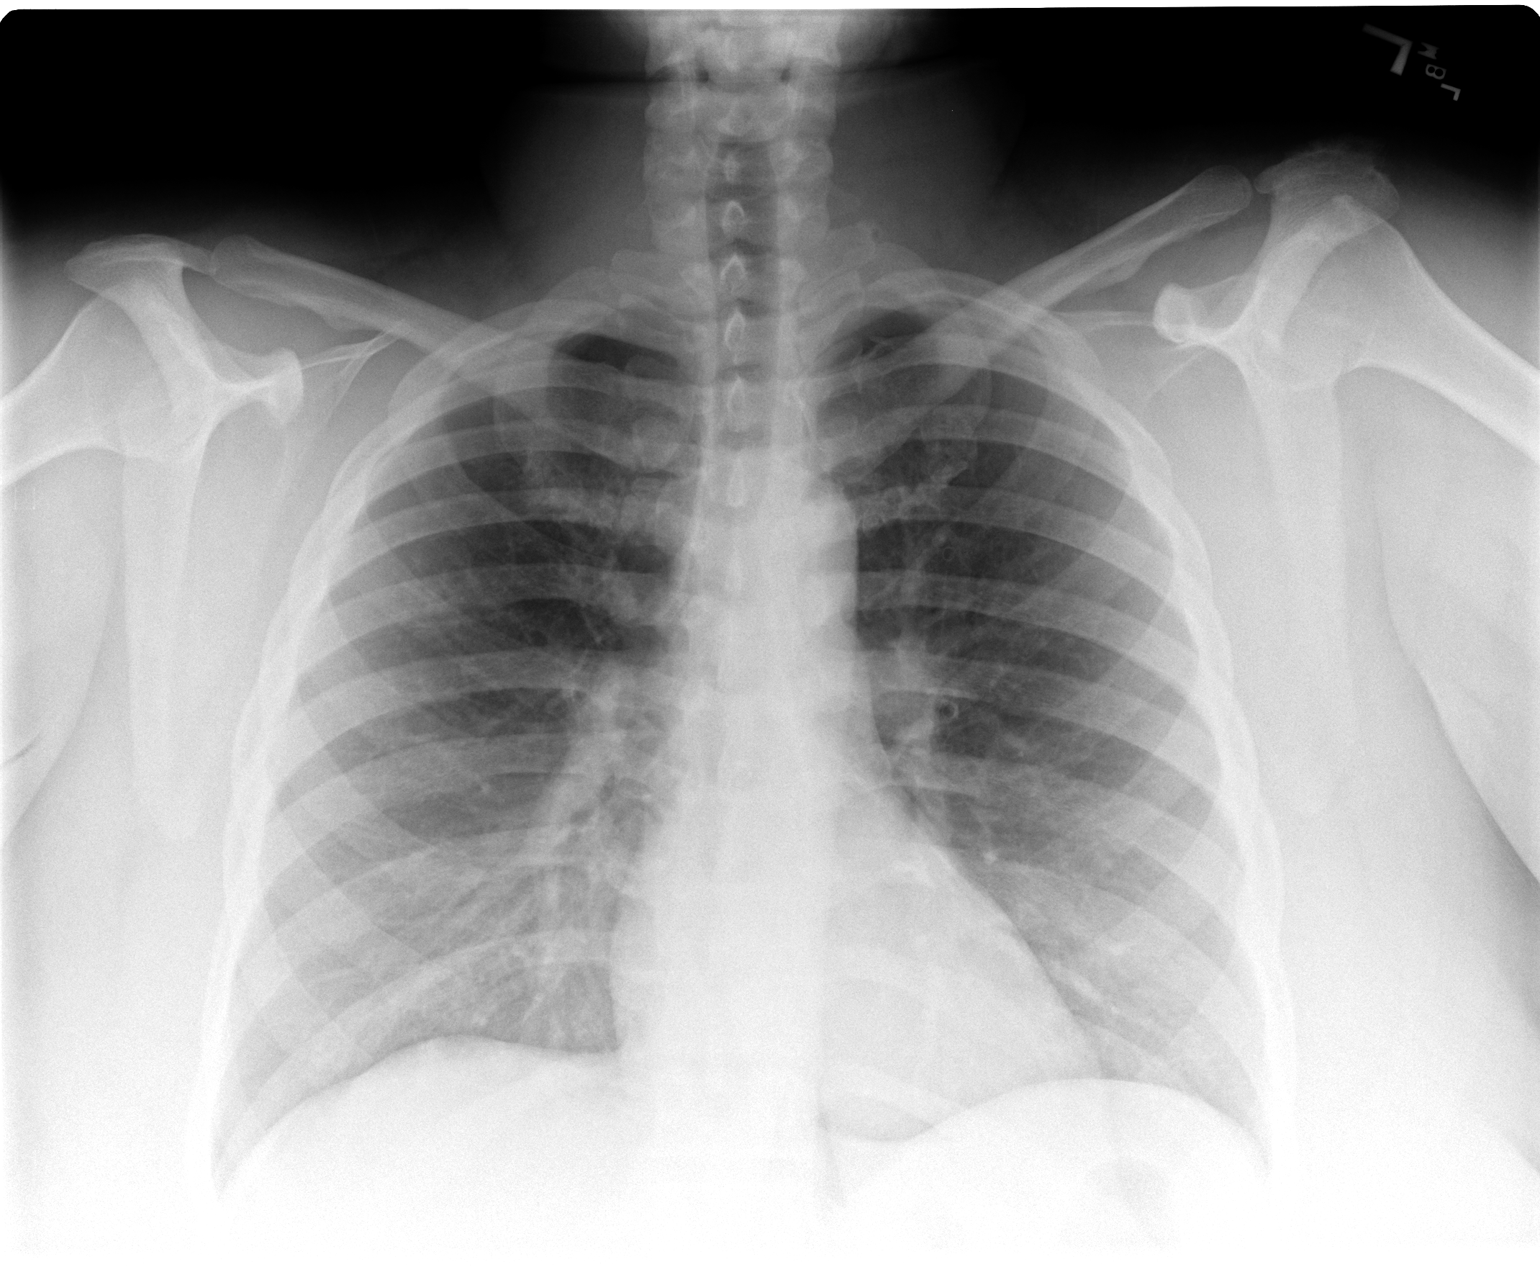

[view not recorded (2 of 2)]
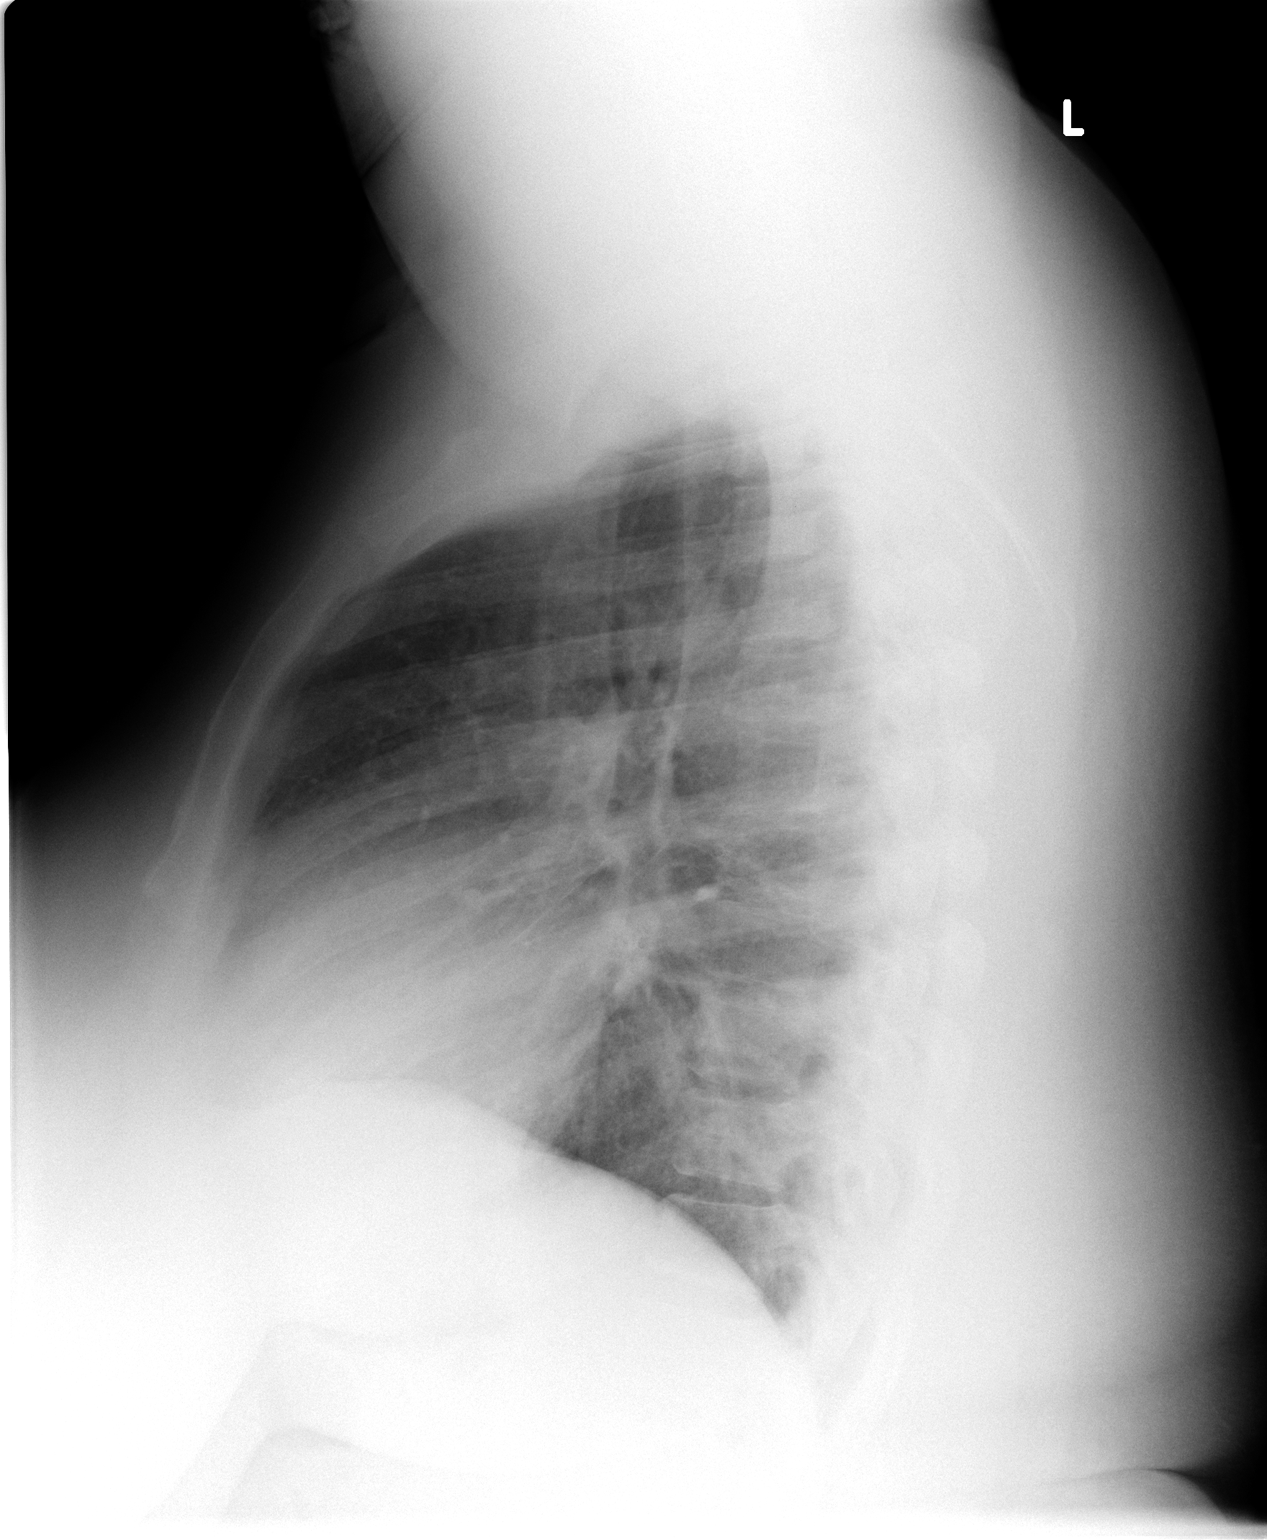

[2 of 2 positions shown; findings below may reference images not displayed]

FINDINGS: Two views of the chest were obtained.  The lungs are clear without focal airspace opacification.  The trachea is midline.  The heart and mediastinum are stable.
IMPRESSION: Clear lungs without focal disease.  Stable from the previous exam.

## 2007-11-09 ENCOUNTER — Emergency Department (HOSPITAL_COMMUNITY): Admission: EM | Admit: 2007-11-09 | Discharge: 2007-11-09 | Payer: Self-pay | Admitting: Emergency Medicine

## 2007-11-11 ENCOUNTER — Ambulatory Visit: Payer: Self-pay | Admitting: Infectious Diseases

## 2007-11-11 DIAGNOSIS — K649 Unspecified hemorrhoids: Secondary | ICD-10-CM | POA: Insufficient documentation

## 2007-11-19 ENCOUNTER — Ambulatory Visit: Payer: Self-pay | Admitting: Internal Medicine

## 2007-12-18 IMAGING — CR DG CHEST 1V PORT
1 series · 1 of 1 positions shown · non-contrast
Comparison: 10/23/06.

CLINICAL DATA: Short of breath/asthma.
 PORTABLE CHEST - 1 VIEW:

[view not recorded]
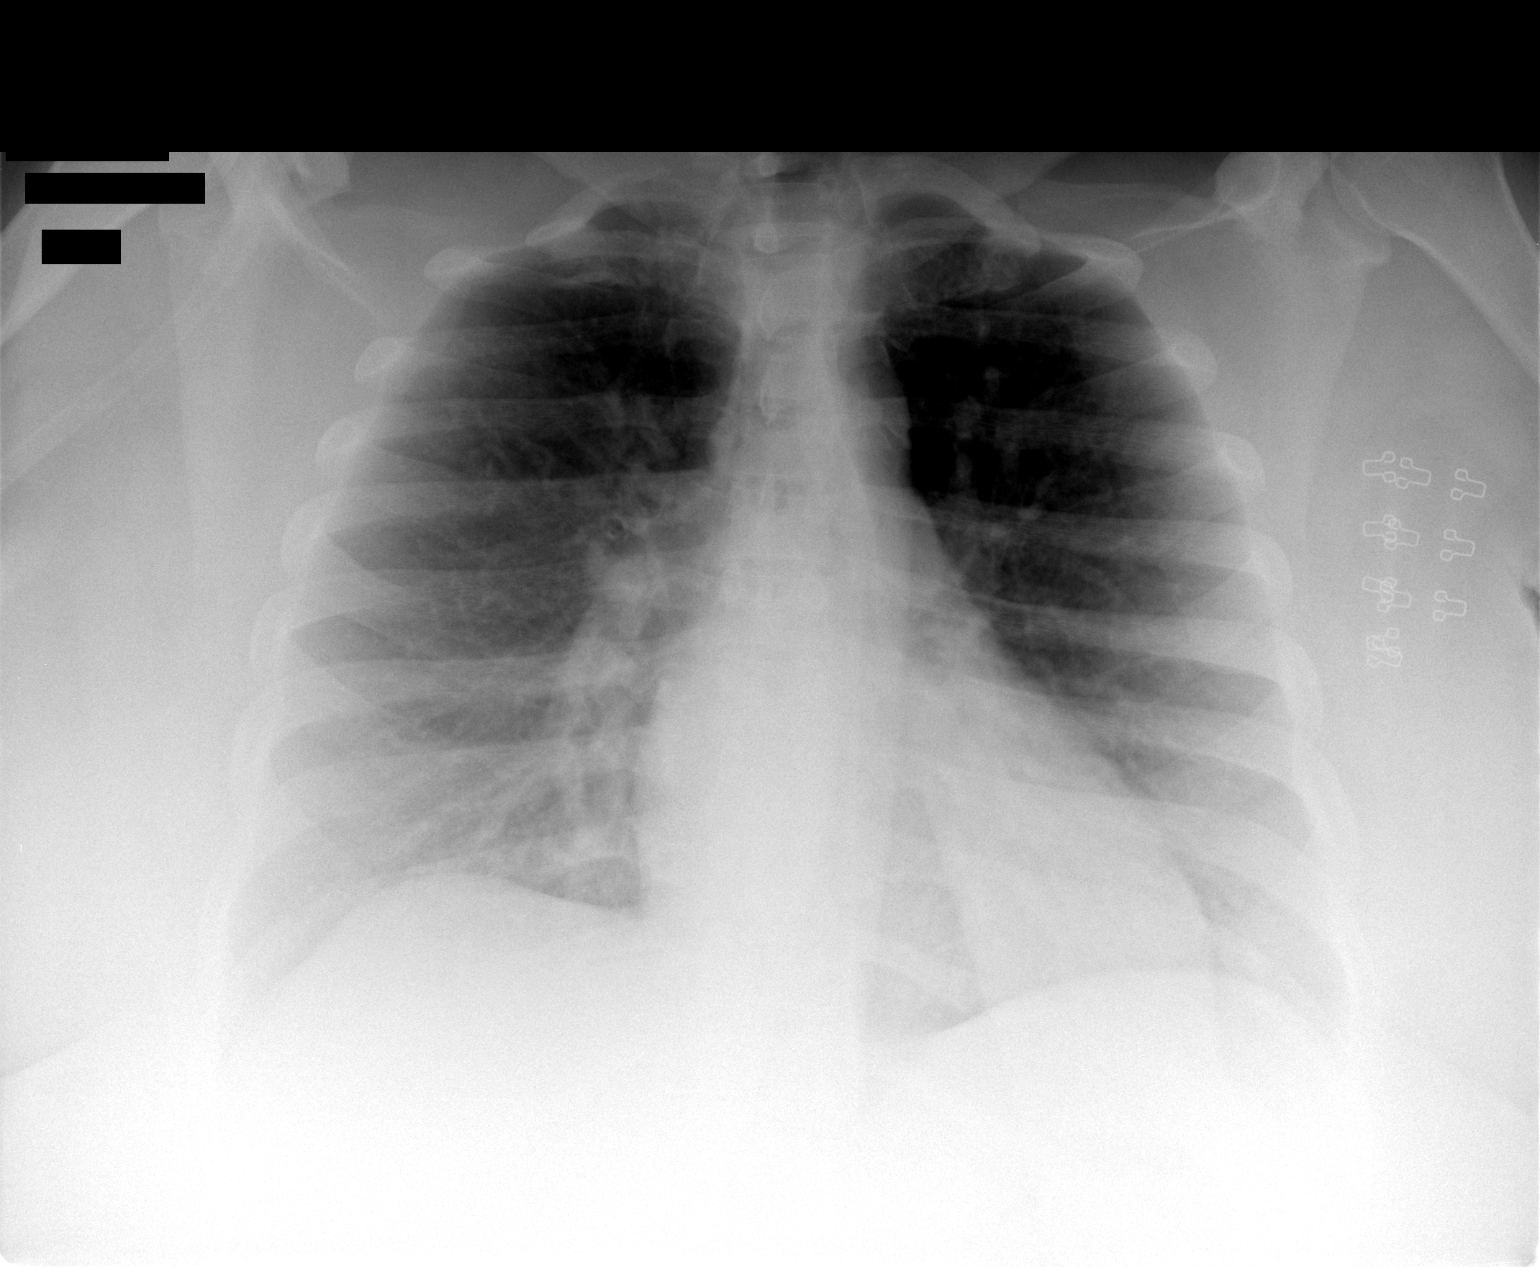

[1 of 1 positions shown; findings below may reference images not displayed]

FINDINGS: Heart and mediastinum normal for a portable technique.  Mild peribronchial thickening without hyperaeration or active airspace disease. Osseous structures intact in one view.
IMPRESSION: Peribronchial thickening ? no active disease.

## 2007-12-30 ENCOUNTER — Emergency Department (HOSPITAL_COMMUNITY): Admission: EM | Admit: 2007-12-30 | Discharge: 2007-12-30 | Payer: Self-pay | Admitting: Emergency Medicine

## 2007-12-31 ENCOUNTER — Observation Stay (HOSPITAL_COMMUNITY): Admission: EM | Admit: 2007-12-31 | Discharge: 2007-12-31 | Payer: Self-pay | Admitting: Emergency Medicine

## 2008-01-21 ENCOUNTER — Ambulatory Visit: Payer: Self-pay | Admitting: Internal Medicine

## 2008-01-29 ENCOUNTER — Ambulatory Visit: Payer: Self-pay | Admitting: *Deleted

## 2008-01-29 ENCOUNTER — Ambulatory Visit (HOSPITAL_COMMUNITY): Admission: RE | Admit: 2008-01-29 | Discharge: 2008-01-29 | Payer: Self-pay | Admitting: Internal Medicine

## 2008-01-29 ENCOUNTER — Ambulatory Visit: Payer: Self-pay | Admitting: Internal Medicine

## 2008-01-29 ENCOUNTER — Encounter: Payer: Self-pay | Admitting: Internal Medicine

## 2008-01-29 DIAGNOSIS — R609 Edema, unspecified: Secondary | ICD-10-CM | POA: Insufficient documentation

## 2008-02-11 ENCOUNTER — Emergency Department (HOSPITAL_COMMUNITY): Admission: EM | Admit: 2008-02-11 | Discharge: 2008-02-11 | Payer: Self-pay | Admitting: Family Medicine

## 2008-03-13 ENCOUNTER — Emergency Department (HOSPITAL_COMMUNITY): Admission: EM | Admit: 2008-03-13 | Discharge: 2008-03-13 | Payer: Self-pay | Admitting: Emergency Medicine

## 2008-04-08 ENCOUNTER — Encounter (INDEPENDENT_AMBULATORY_CARE_PROVIDER_SITE_OTHER): Payer: Self-pay | Admitting: *Deleted

## 2008-04-08 ENCOUNTER — Ambulatory Visit: Payer: Self-pay | Admitting: Internal Medicine

## 2008-04-08 ENCOUNTER — Encounter (INDEPENDENT_AMBULATORY_CARE_PROVIDER_SITE_OTHER): Payer: Self-pay | Admitting: Internal Medicine

## 2008-04-08 ENCOUNTER — Telehealth (INDEPENDENT_AMBULATORY_CARE_PROVIDER_SITE_OTHER): Payer: Self-pay | Admitting: *Deleted

## 2008-04-08 DIAGNOSIS — M7120 Synovial cyst of popliteal space [Baker], unspecified knee: Secondary | ICD-10-CM | POA: Insufficient documentation

## 2008-04-08 DIAGNOSIS — M5090 Cervical disc disorder, unspecified, unspecified cervical region: Secondary | ICD-10-CM | POA: Insufficient documentation

## 2008-04-08 LAB — CONVERTED CEMR LAB
BUN: 10 mg/dL (ref 6–23)
Chloride: 108 meq/L (ref 96–112)
Creatinine, Ser: 0.68 mg/dL (ref 0.40–1.20)
Glucose, Bld: 85 mg/dL (ref 70–99)
Potassium: 4.3 meq/L (ref 3.5–5.3)

## 2008-04-13 ENCOUNTER — Ambulatory Visit: Payer: Self-pay | Admitting: Pulmonary Disease

## 2008-04-22 IMAGING — CR DG FOOT 2V*R*
2 series · 2 of 2 positions shown · non-contrast
Comparison: none

CLINICAL DATA: No injury.  Painful right heel for 4 months. 
 RIGHT FOOT - 2 VIEW:

[view not recorded (1 of 2)]
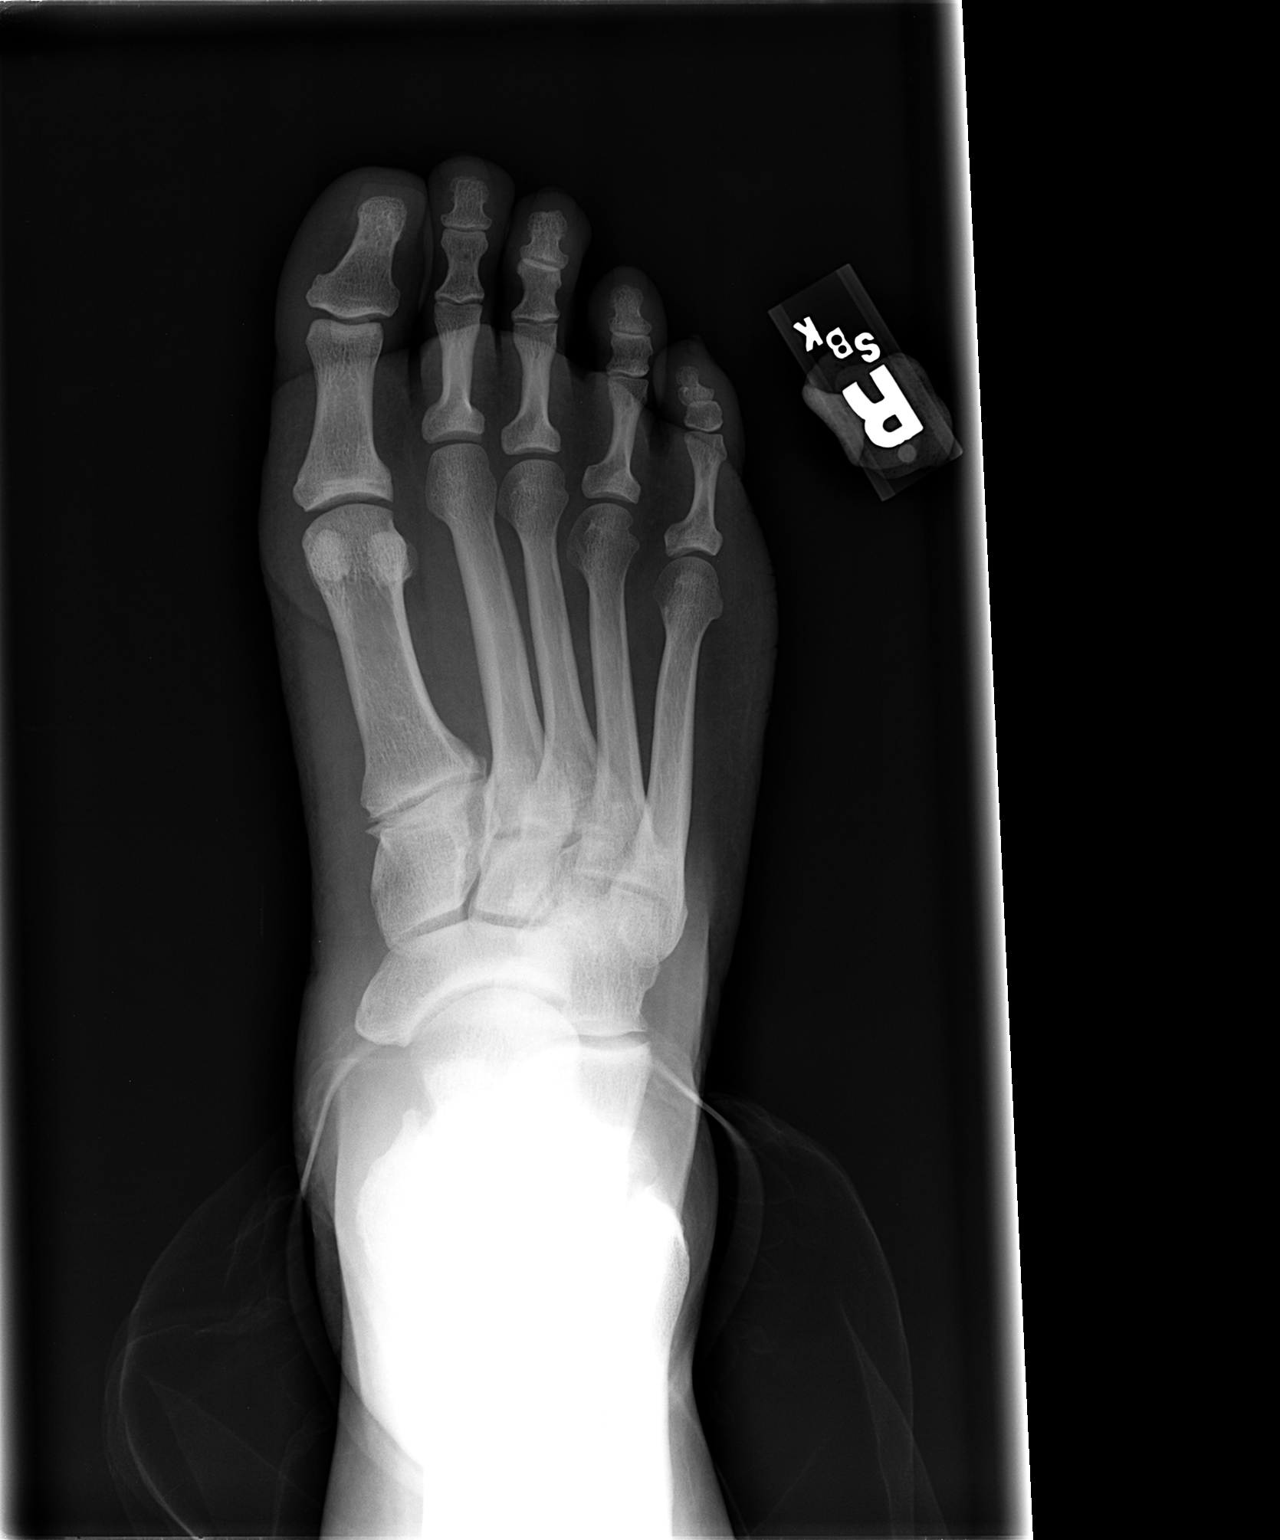

[view not recorded (2 of 2)]
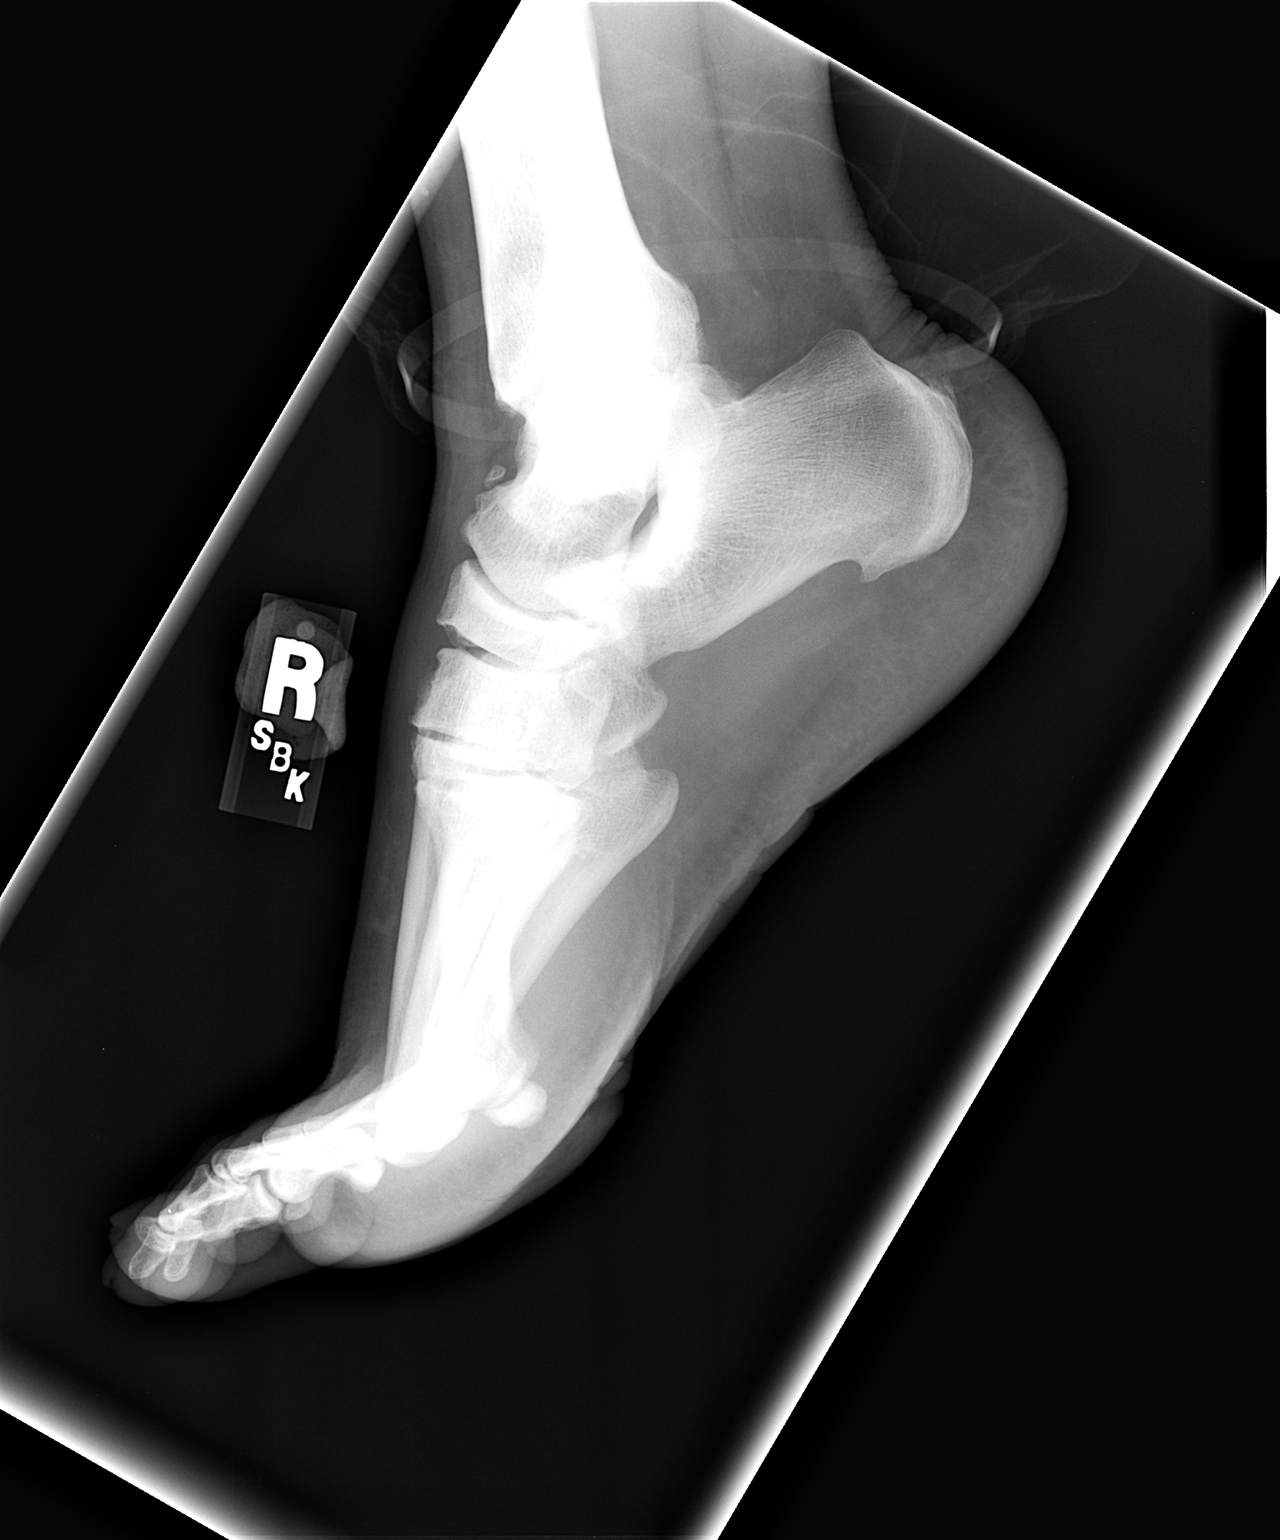

[2 of 2 positions shown; findings below may reference images not displayed]

FINDINGS: Small spur at the Achilles tendon insertion site.  Small spur at the plantar fascia origin.  Mild degenerative changes first metatarsal/tarsal articulation.
IMPRESSION: 1.  Small spur plantar fascia origin.
 2.  Small spur Achilles tendon insertion. 
 3.  Mild degenerative changes first metatarsal/tarsal articulation.

## 2008-04-27 ENCOUNTER — Ambulatory Visit: Payer: Self-pay | Admitting: Infectious Diseases

## 2008-05-24 ENCOUNTER — Ambulatory Visit: Payer: Self-pay | Admitting: Internal Medicine

## 2008-05-24 ENCOUNTER — Encounter (INDEPENDENT_AMBULATORY_CARE_PROVIDER_SITE_OTHER): Payer: Self-pay | Admitting: Internal Medicine

## 2008-06-02 ENCOUNTER — Encounter (INDEPENDENT_AMBULATORY_CARE_PROVIDER_SITE_OTHER): Payer: Self-pay | Admitting: Internal Medicine

## 2008-06-02 ENCOUNTER — Telehealth (INDEPENDENT_AMBULATORY_CARE_PROVIDER_SITE_OTHER): Payer: Self-pay | Admitting: *Deleted

## 2008-06-02 ENCOUNTER — Ambulatory Visit: Payer: Self-pay | Admitting: *Deleted

## 2008-06-02 DIAGNOSIS — M25569 Pain in unspecified knee: Secondary | ICD-10-CM | POA: Insufficient documentation

## 2008-06-02 LAB — CONVERTED CEMR LAB
BUN: 8 mg/dL (ref 6–23)
CO2: 23 meq/L (ref 19–32)
Calcium: 8.9 mg/dL (ref 8.4–10.5)
Creatinine, Ser: 0.57 mg/dL (ref 0.40–1.20)
Glucose, Bld: 87 mg/dL (ref 70–99)
Rhuematoid fact SerPl-aCnc: <20 intl units/mL (ref 0–20)

## 2008-06-03 ENCOUNTER — Encounter: Payer: Self-pay | Admitting: Internal Medicine

## 2008-06-06 IMAGING — CR DG CHEST 2V
2 series · 2 of 2 positions shown · non-contrast
Comparison: 12/02/06.

CLINICAL DATA: Short of breath and asthma.
 CHEST ? 2 VIEW:

[w chest pa]
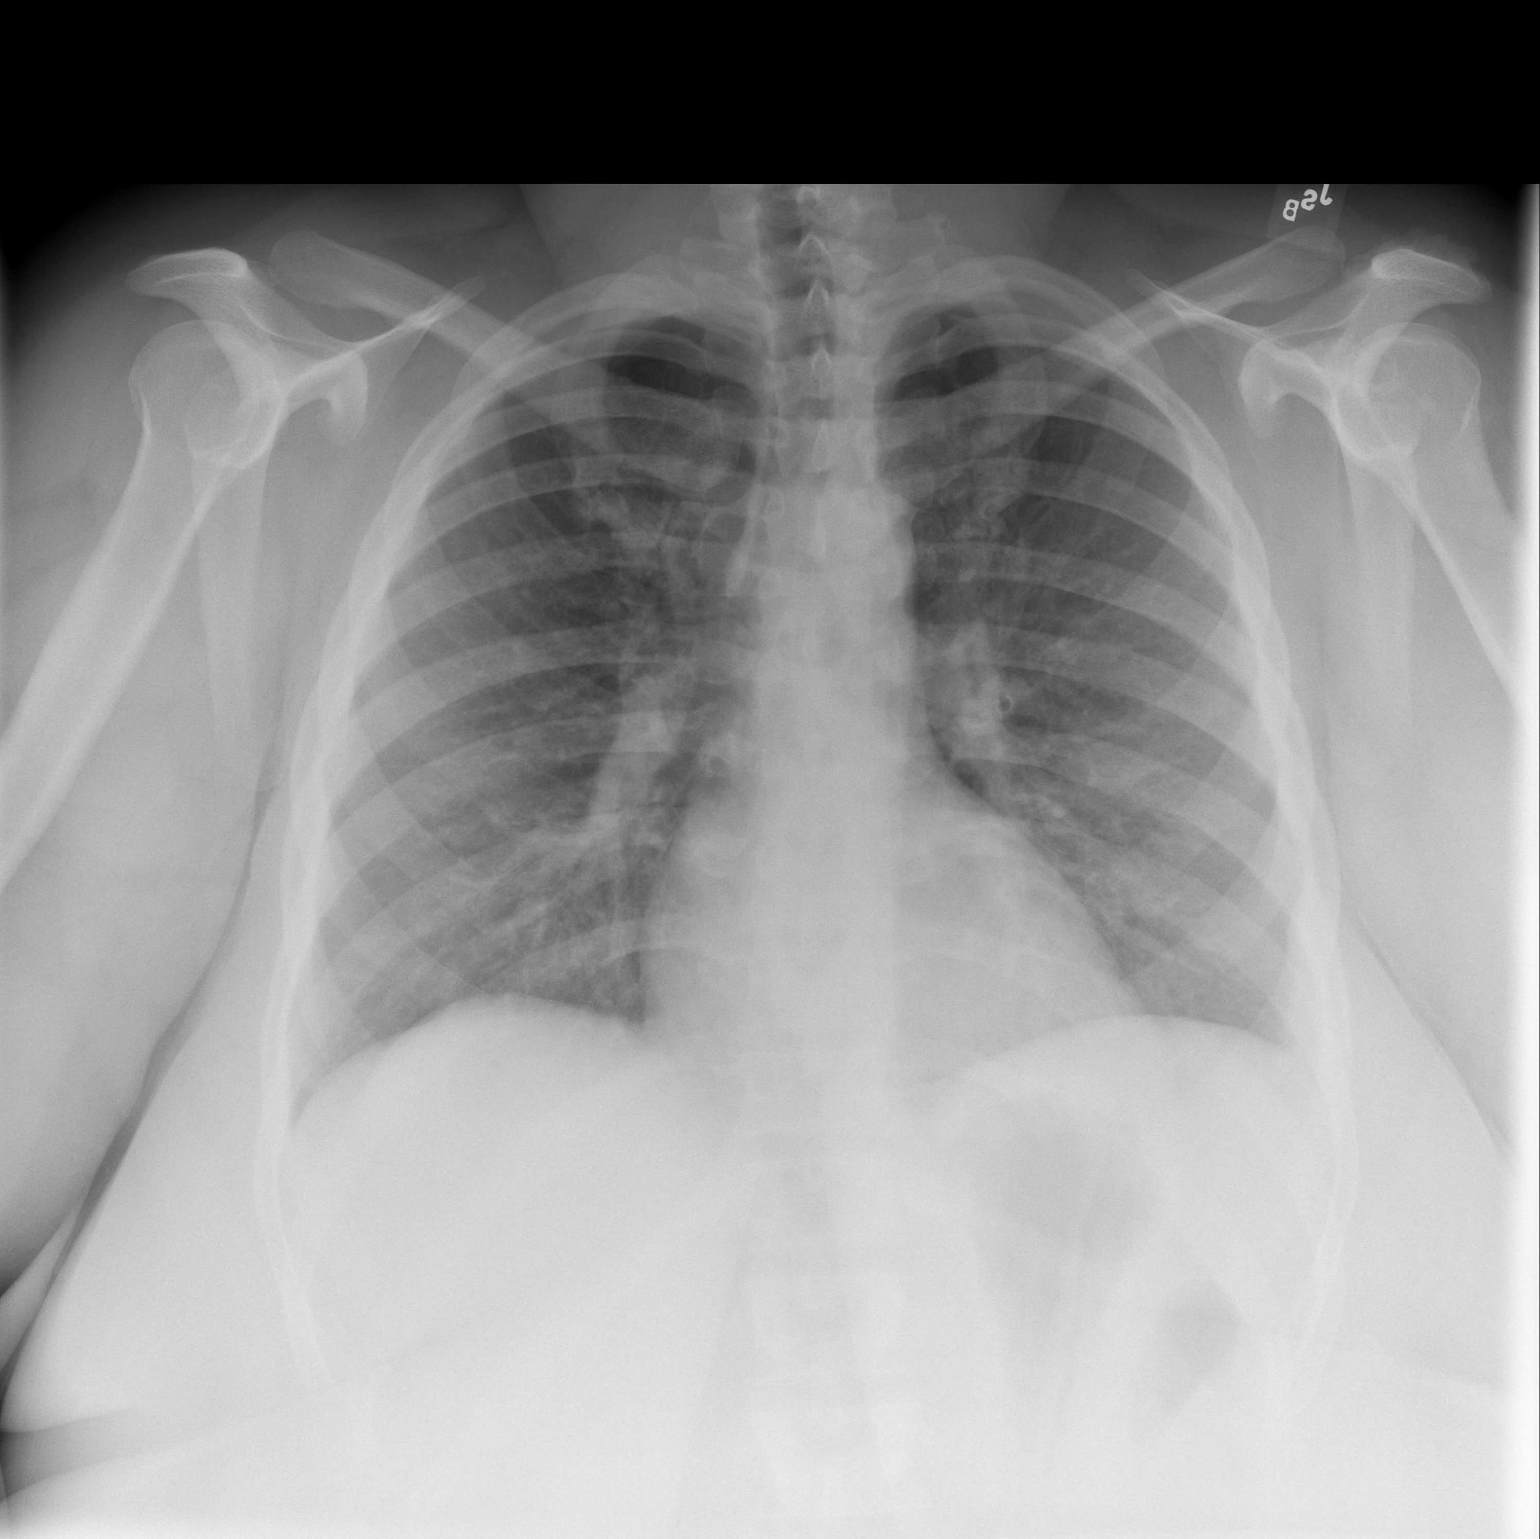

[w chest lat]
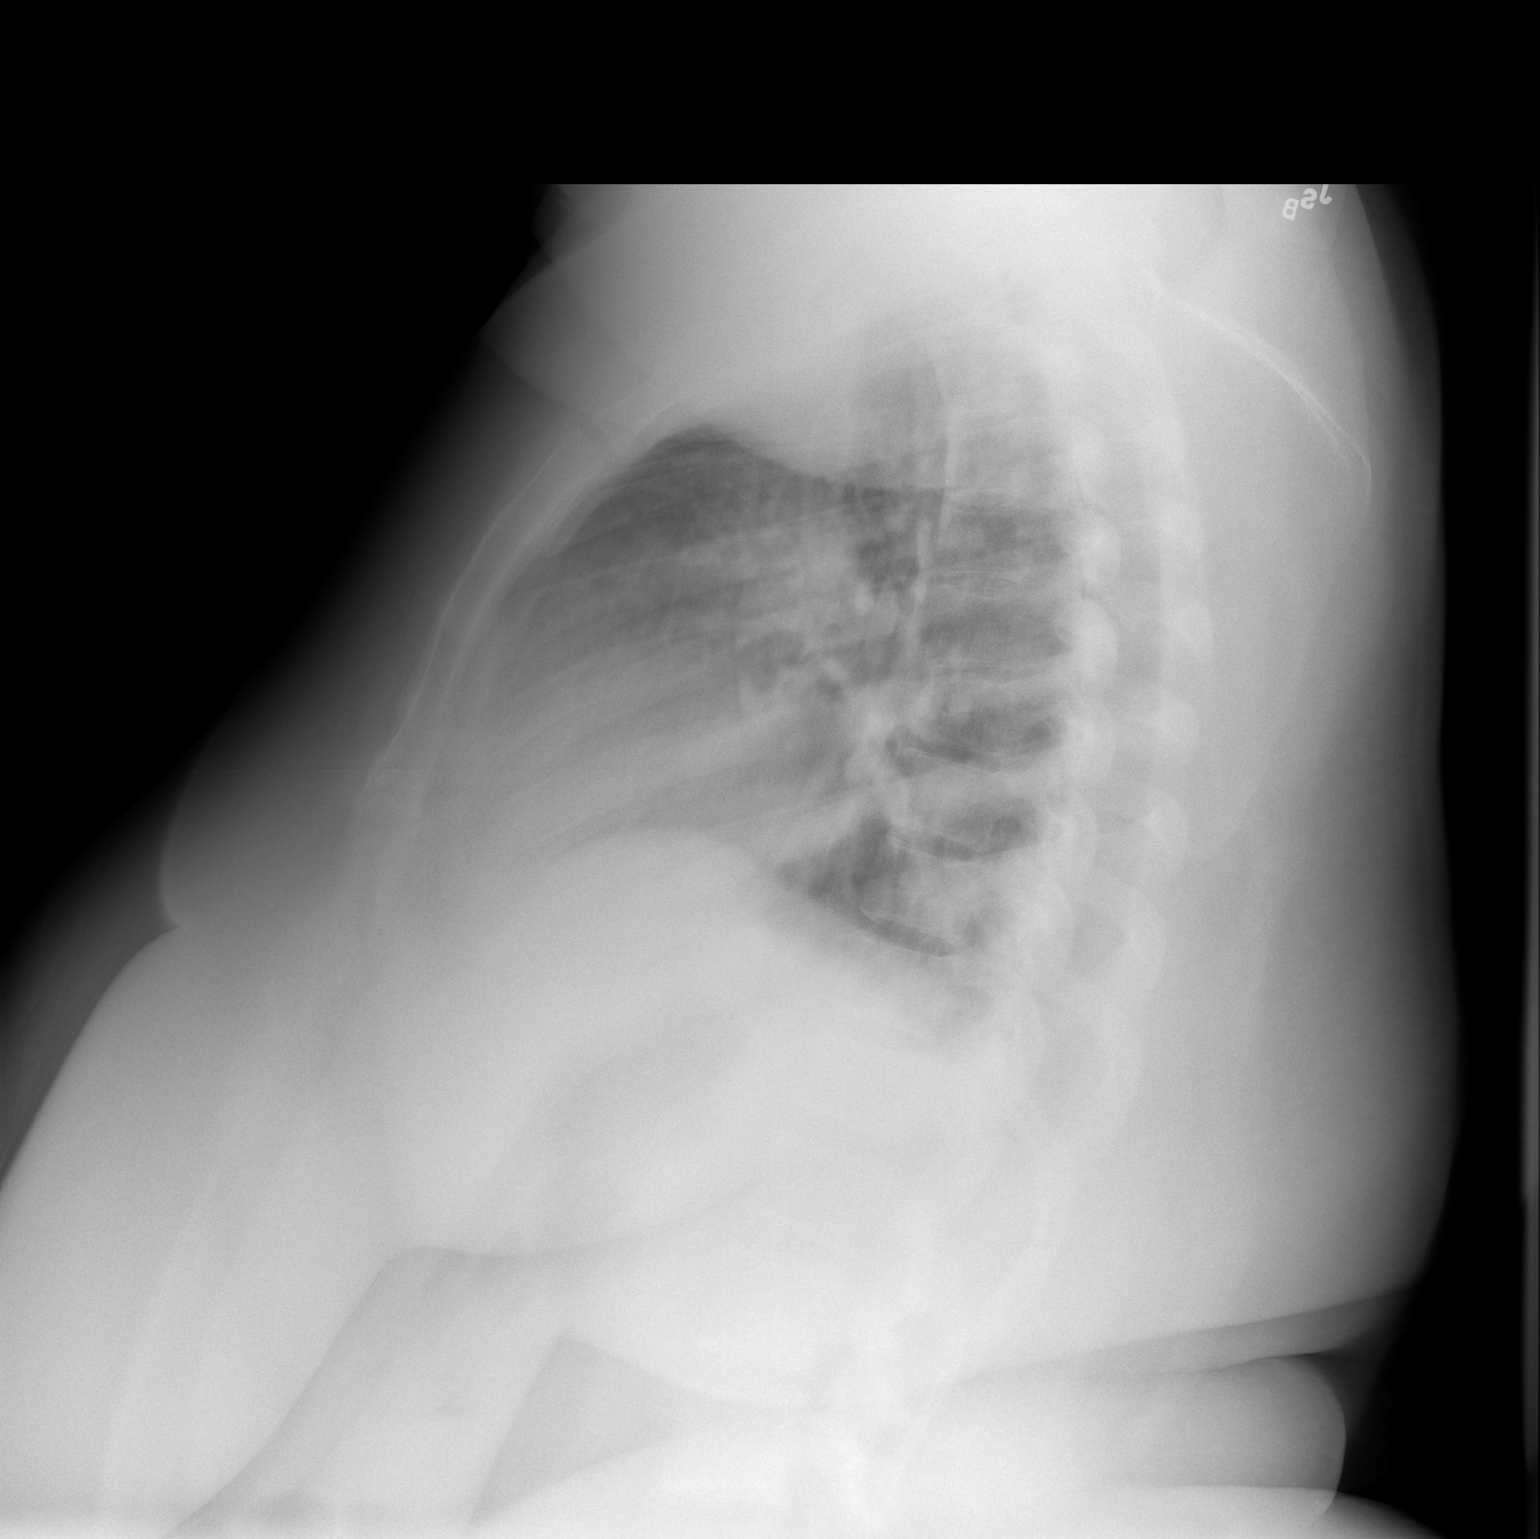

[2 of 2 positions shown; findings below may reference images not displayed]

FINDINGS: Mild prominence to the bronchial markings is seen, similar to the prior study.  This is likely due to asthma.  No focal infiltrate is seen.  The patient was breathing during the lateral exposure, but I do not believe there is any significant pleural effusion.
IMPRESSION: Peribronchial thickening which may be due to asthma.  No acute infiltrate.

## 2008-06-13 ENCOUNTER — Telehealth (INDEPENDENT_AMBULATORY_CARE_PROVIDER_SITE_OTHER): Payer: Self-pay | Admitting: *Deleted

## 2008-06-13 ENCOUNTER — Emergency Department (HOSPITAL_COMMUNITY): Admission: EM | Admit: 2008-06-13 | Discharge: 2008-06-13 | Payer: Self-pay | Admitting: Emergency Medicine

## 2008-06-30 ENCOUNTER — Encounter (INDEPENDENT_AMBULATORY_CARE_PROVIDER_SITE_OTHER): Payer: Self-pay | Admitting: Internal Medicine

## 2008-06-30 ENCOUNTER — Ambulatory Visit: Payer: Self-pay | Admitting: *Deleted

## 2008-07-07 ENCOUNTER — Emergency Department (HOSPITAL_COMMUNITY): Admission: EM | Admit: 2008-07-07 | Discharge: 2008-07-07 | Payer: Self-pay | Admitting: Family Medicine

## 2008-07-13 ENCOUNTER — Encounter (INDEPENDENT_AMBULATORY_CARE_PROVIDER_SITE_OTHER): Payer: Self-pay | Admitting: Internal Medicine

## 2008-07-27 ENCOUNTER — Ambulatory Visit: Payer: Self-pay | Admitting: Internal Medicine

## 2008-07-27 ENCOUNTER — Encounter (INDEPENDENT_AMBULATORY_CARE_PROVIDER_SITE_OTHER): Payer: Self-pay | Admitting: Internal Medicine

## 2008-08-18 ENCOUNTER — Ambulatory Visit: Payer: Self-pay | Admitting: Pulmonary Disease

## 2008-08-29 ENCOUNTER — Emergency Department (HOSPITAL_COMMUNITY): Admission: EM | Admit: 2008-08-29 | Discharge: 2008-08-29 | Payer: Self-pay | Admitting: Emergency Medicine

## 2008-09-09 ENCOUNTER — Ambulatory Visit: Payer: Self-pay | Admitting: Gastroenterology

## 2008-09-10 ENCOUNTER — Emergency Department (HOSPITAL_COMMUNITY): Admission: EM | Admit: 2008-09-10 | Discharge: 2008-09-10 | Payer: Self-pay | Admitting: Emergency Medicine

## 2008-09-16 ENCOUNTER — Encounter (INDEPENDENT_AMBULATORY_CARE_PROVIDER_SITE_OTHER): Payer: Self-pay | Admitting: *Deleted

## 2008-09-16 ENCOUNTER — Ambulatory Visit: Payer: Self-pay | Admitting: Infectious Disease

## 2008-09-16 DIAGNOSIS — R079 Chest pain, unspecified: Secondary | ICD-10-CM | POA: Insufficient documentation

## 2008-09-18 ENCOUNTER — Encounter: Payer: Self-pay | Admitting: Pulmonary Disease

## 2008-09-28 ENCOUNTER — Inpatient Hospital Stay (HOSPITAL_COMMUNITY): Admission: AD | Admit: 2008-09-28 | Discharge: 2008-09-29 | Payer: Self-pay | Admitting: *Deleted

## 2008-09-28 ENCOUNTER — Encounter (INDEPENDENT_AMBULATORY_CARE_PROVIDER_SITE_OTHER): Payer: Self-pay | Admitting: *Deleted

## 2008-09-28 ENCOUNTER — Ambulatory Visit: Payer: Self-pay | Admitting: *Deleted

## 2008-09-28 ENCOUNTER — Ambulatory Visit: Payer: Self-pay | Admitting: Internal Medicine

## 2008-09-29 ENCOUNTER — Encounter (INDEPENDENT_AMBULATORY_CARE_PROVIDER_SITE_OTHER): Payer: Self-pay | Admitting: *Deleted

## 2008-09-30 ENCOUNTER — Telehealth (INDEPENDENT_AMBULATORY_CARE_PROVIDER_SITE_OTHER): Payer: Self-pay | Admitting: *Deleted

## 2008-10-11 ENCOUNTER — Ambulatory Visit: Payer: Self-pay | Admitting: Internal Medicine

## 2008-10-19 ENCOUNTER — Emergency Department (HOSPITAL_COMMUNITY): Admission: EM | Admit: 2008-10-19 | Discharge: 2008-10-19 | Payer: Self-pay | Admitting: Family Medicine

## 2008-11-03 ENCOUNTER — Emergency Department (HOSPITAL_COMMUNITY): Admission: EM | Admit: 2008-11-03 | Discharge: 2008-11-03 | Payer: Self-pay | Admitting: Family Medicine

## 2008-11-14 ENCOUNTER — Ambulatory Visit: Payer: Self-pay | Admitting: Infectious Diseases

## 2008-11-14 ENCOUNTER — Encounter: Payer: Self-pay | Admitting: Internal Medicine

## 2008-11-14 LAB — CONVERTED CEMR LAB
AST: 14 units/L (ref 0–37)
Alkaline Phosphatase: 63 units/L (ref 39–117)
BUN: 9 mg/dL (ref 6–23)
Calcium: 8.9 mg/dL (ref 8.4–10.5)
Creatinine, Ser: 0.82 mg/dL (ref 0.40–1.20)
Total Bilirubin: 0.2 mg/dL — ABNORMAL LOW (ref 0.3–1.2)

## 2008-11-24 ENCOUNTER — Emergency Department (HOSPITAL_COMMUNITY): Admission: EM | Admit: 2008-11-24 | Discharge: 2008-11-24 | Payer: Self-pay | Admitting: Emergency Medicine

## 2008-11-29 ENCOUNTER — Emergency Department (HOSPITAL_COMMUNITY): Admission: EM | Admit: 2008-11-29 | Discharge: 2008-11-29 | Payer: Self-pay | Admitting: Emergency Medicine

## 2008-11-30 ENCOUNTER — Ambulatory Visit: Payer: Self-pay | Admitting: Infectious Disease

## 2008-11-30 ENCOUNTER — Observation Stay (HOSPITAL_COMMUNITY): Admission: EM | Admit: 2008-11-30 | Discharge: 2008-11-30 | Payer: Self-pay | Admitting: Emergency Medicine

## 2008-11-30 ENCOUNTER — Encounter (INDEPENDENT_AMBULATORY_CARE_PROVIDER_SITE_OTHER): Payer: Self-pay | Admitting: Internal Medicine

## 2008-12-01 ENCOUNTER — Ambulatory Visit: Payer: Self-pay | Admitting: Internal Medicine

## 2008-12-01 ENCOUNTER — Telehealth: Payer: Self-pay | Admitting: *Deleted

## 2008-12-16 ENCOUNTER — Ambulatory Visit: Payer: Self-pay | Admitting: Internal Medicine

## 2008-12-23 DIAGNOSIS — J309 Allergic rhinitis, unspecified: Secondary | ICD-10-CM | POA: Insufficient documentation

## 2009-01-04 ENCOUNTER — Emergency Department (HOSPITAL_COMMUNITY): Admission: EM | Admit: 2009-01-04 | Discharge: 2009-01-04 | Payer: Self-pay | Admitting: Family Medicine

## 2009-01-04 ENCOUNTER — Emergency Department (HOSPITAL_COMMUNITY): Admission: EM | Admit: 2009-01-04 | Discharge: 2009-01-04 | Payer: Self-pay | Admitting: Emergency Medicine

## 2009-01-05 ENCOUNTER — Encounter (INDEPENDENT_AMBULATORY_CARE_PROVIDER_SITE_OTHER): Payer: Self-pay | Admitting: Emergency Medicine

## 2009-01-05 ENCOUNTER — Ambulatory Visit: Payer: Self-pay | Admitting: Vascular Surgery

## 2009-01-05 ENCOUNTER — Emergency Department (HOSPITAL_COMMUNITY): Admission: EM | Admit: 2009-01-05 | Discharge: 2009-01-05 | Payer: Self-pay | Admitting: Emergency Medicine

## 2009-01-06 ENCOUNTER — Telehealth: Payer: Self-pay | Admitting: Internal Medicine

## 2009-01-06 ENCOUNTER — Telehealth (INDEPENDENT_AMBULATORY_CARE_PROVIDER_SITE_OTHER): Payer: Self-pay | Admitting: *Deleted

## 2009-01-09 ENCOUNTER — Encounter: Payer: Self-pay | Admitting: Adult Health

## 2009-01-09 ENCOUNTER — Ambulatory Visit: Payer: Self-pay | Admitting: Internal Medicine

## 2009-01-14 IMAGING — CR DG CHEST 2V
2 series · 2 of 2 positions shown · non-contrast
Comparison: 05/22/2007

CLINICAL DATA: Shortness of breath, asthma, wheezing.

CHEST - 2 VIEW

[w chest pa]
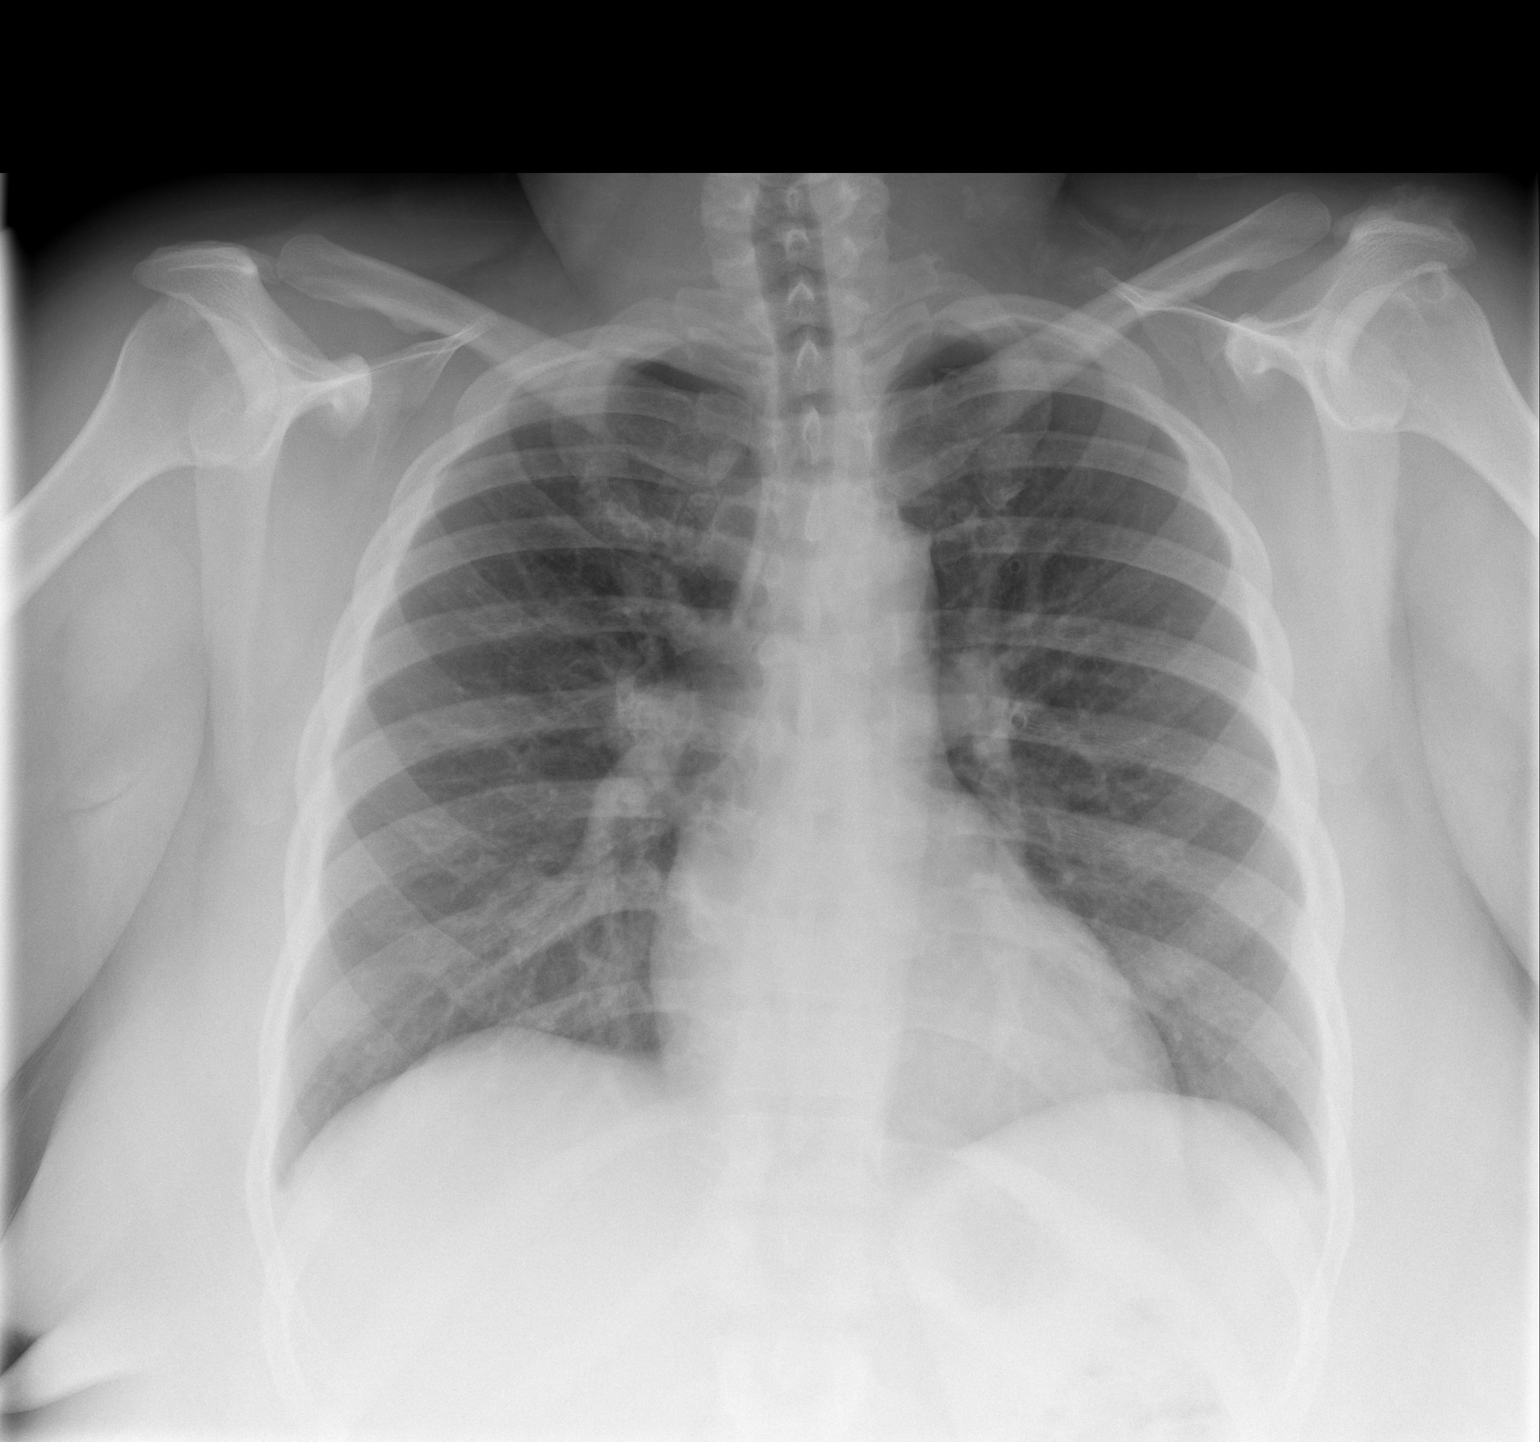

[w chest lat]
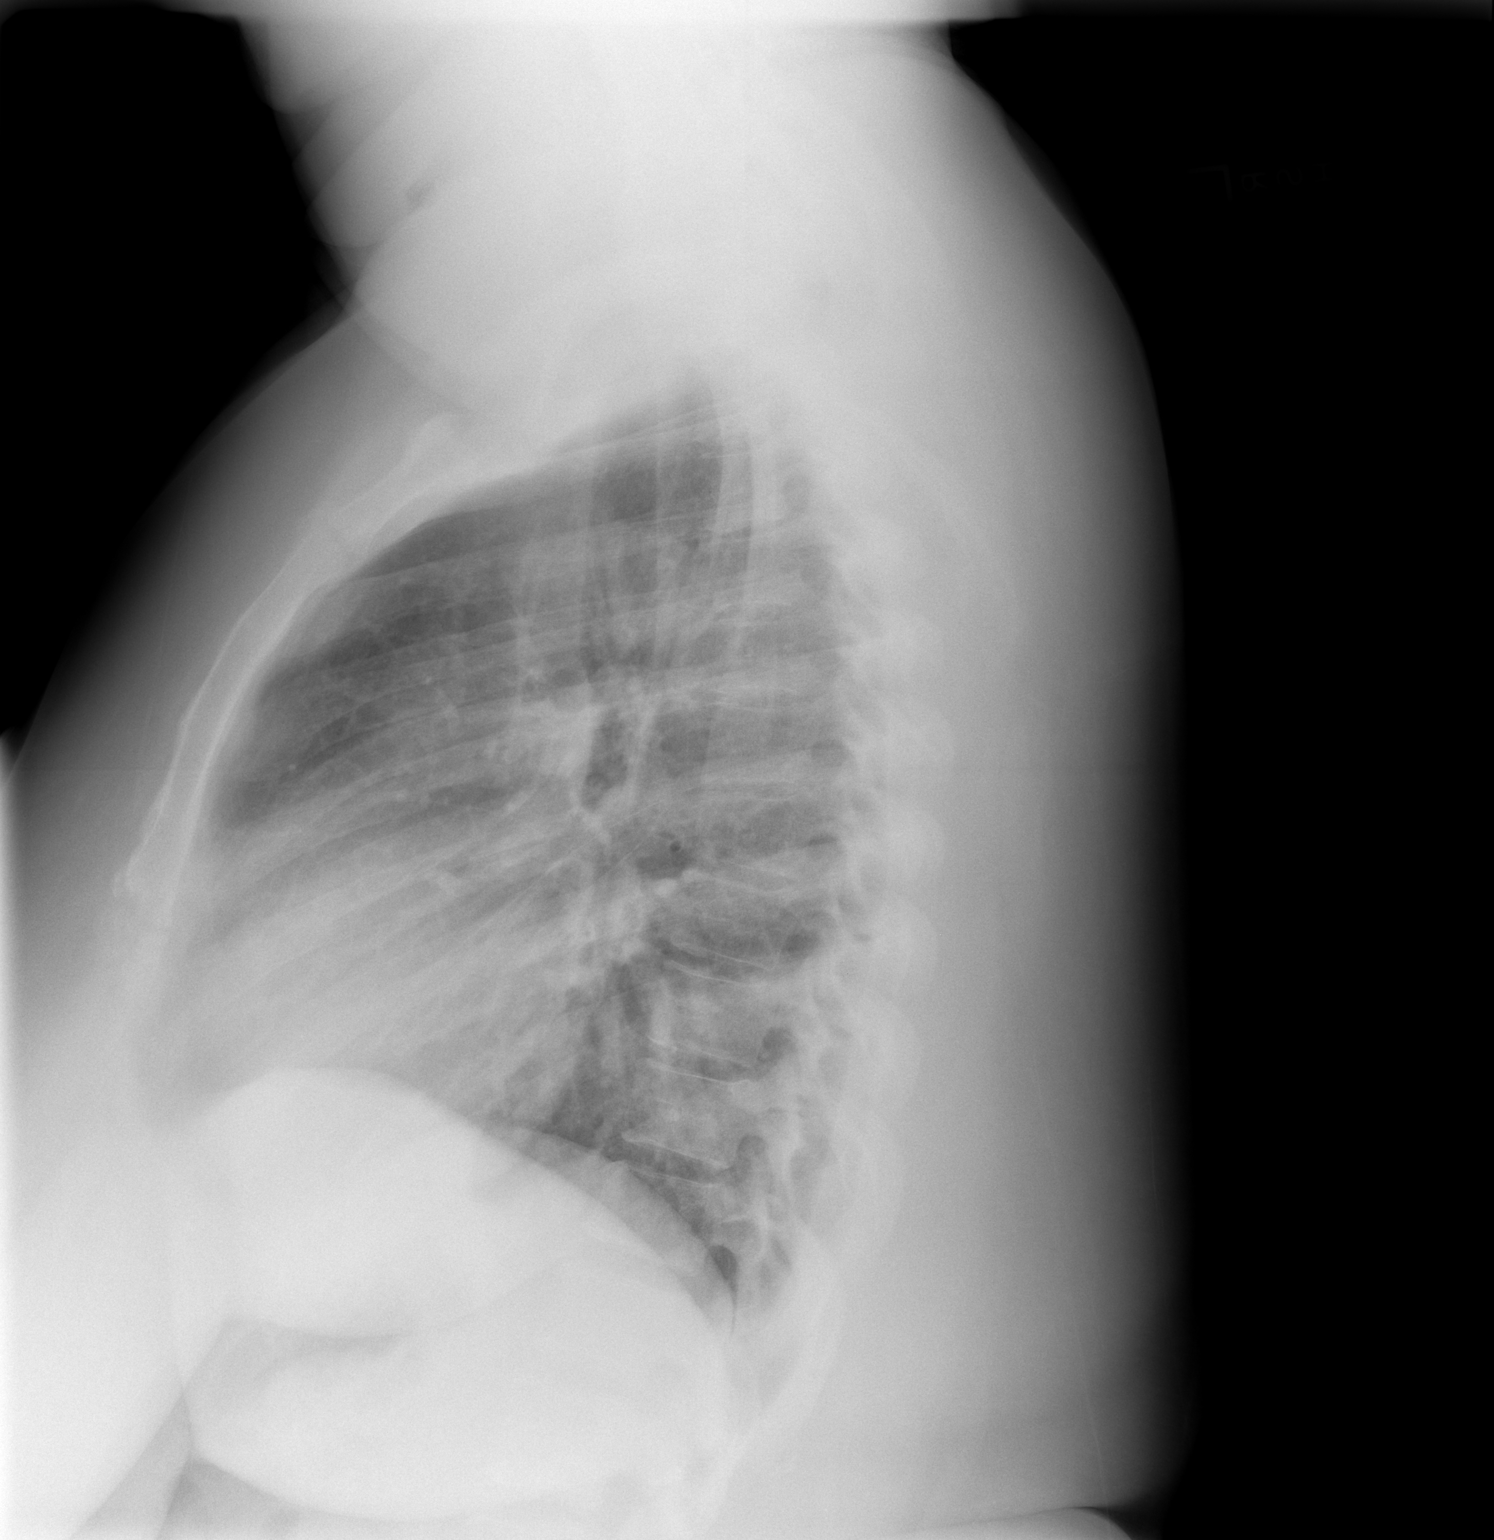

[2 of 2 positions shown; findings below may reference images not displayed]

FINDINGS: Trachea is midline.  Heart size normal.  Lungs are clear.
No pleural fluid.
IMPRESSION: No acute findings.

## 2009-01-15 IMAGING — CR DG CHEST 2V
2 series · 2 of 2 positions shown · non-contrast
Comparison: 12/30/2007, 12/02/2006

CLINICAL DATA: Shortness of breath

CHEST - 2 VIEW

[w chest pa]
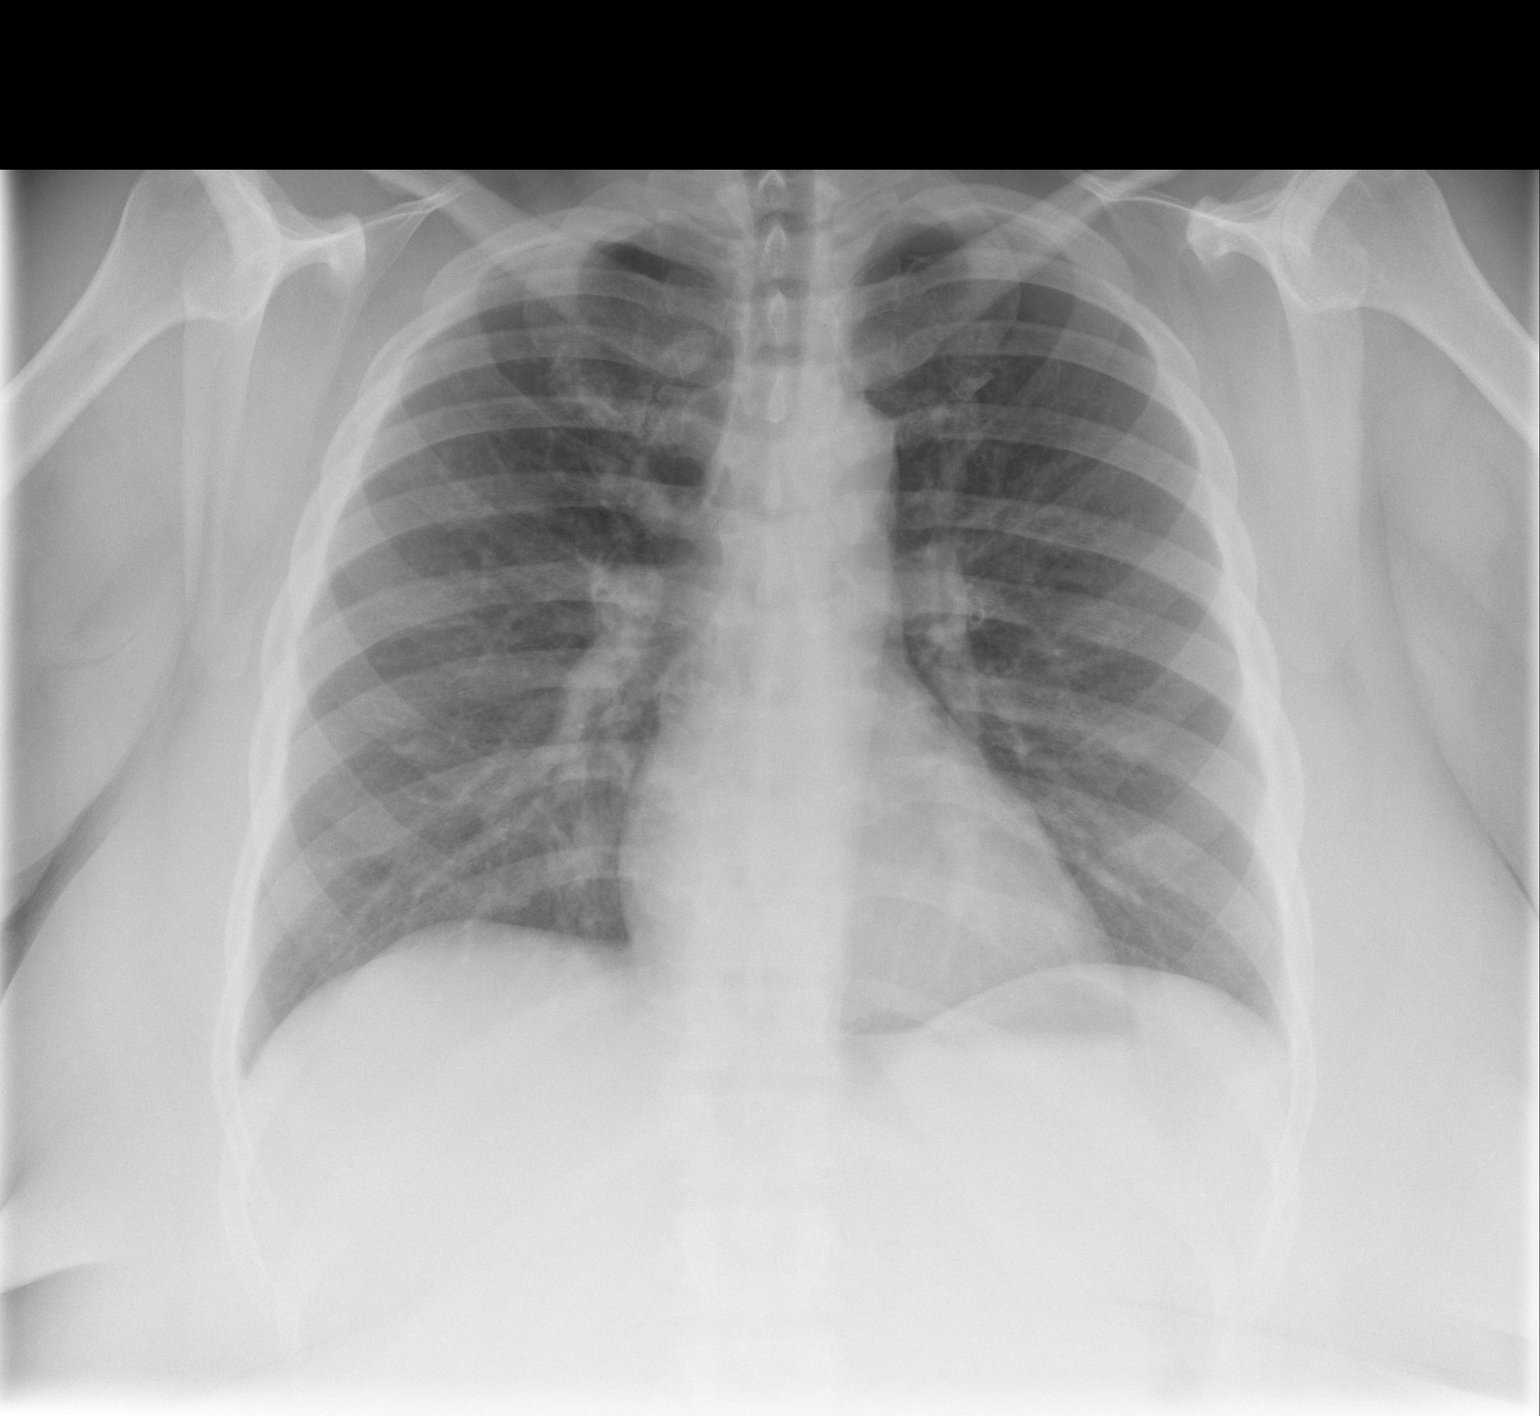

[w chest lat]
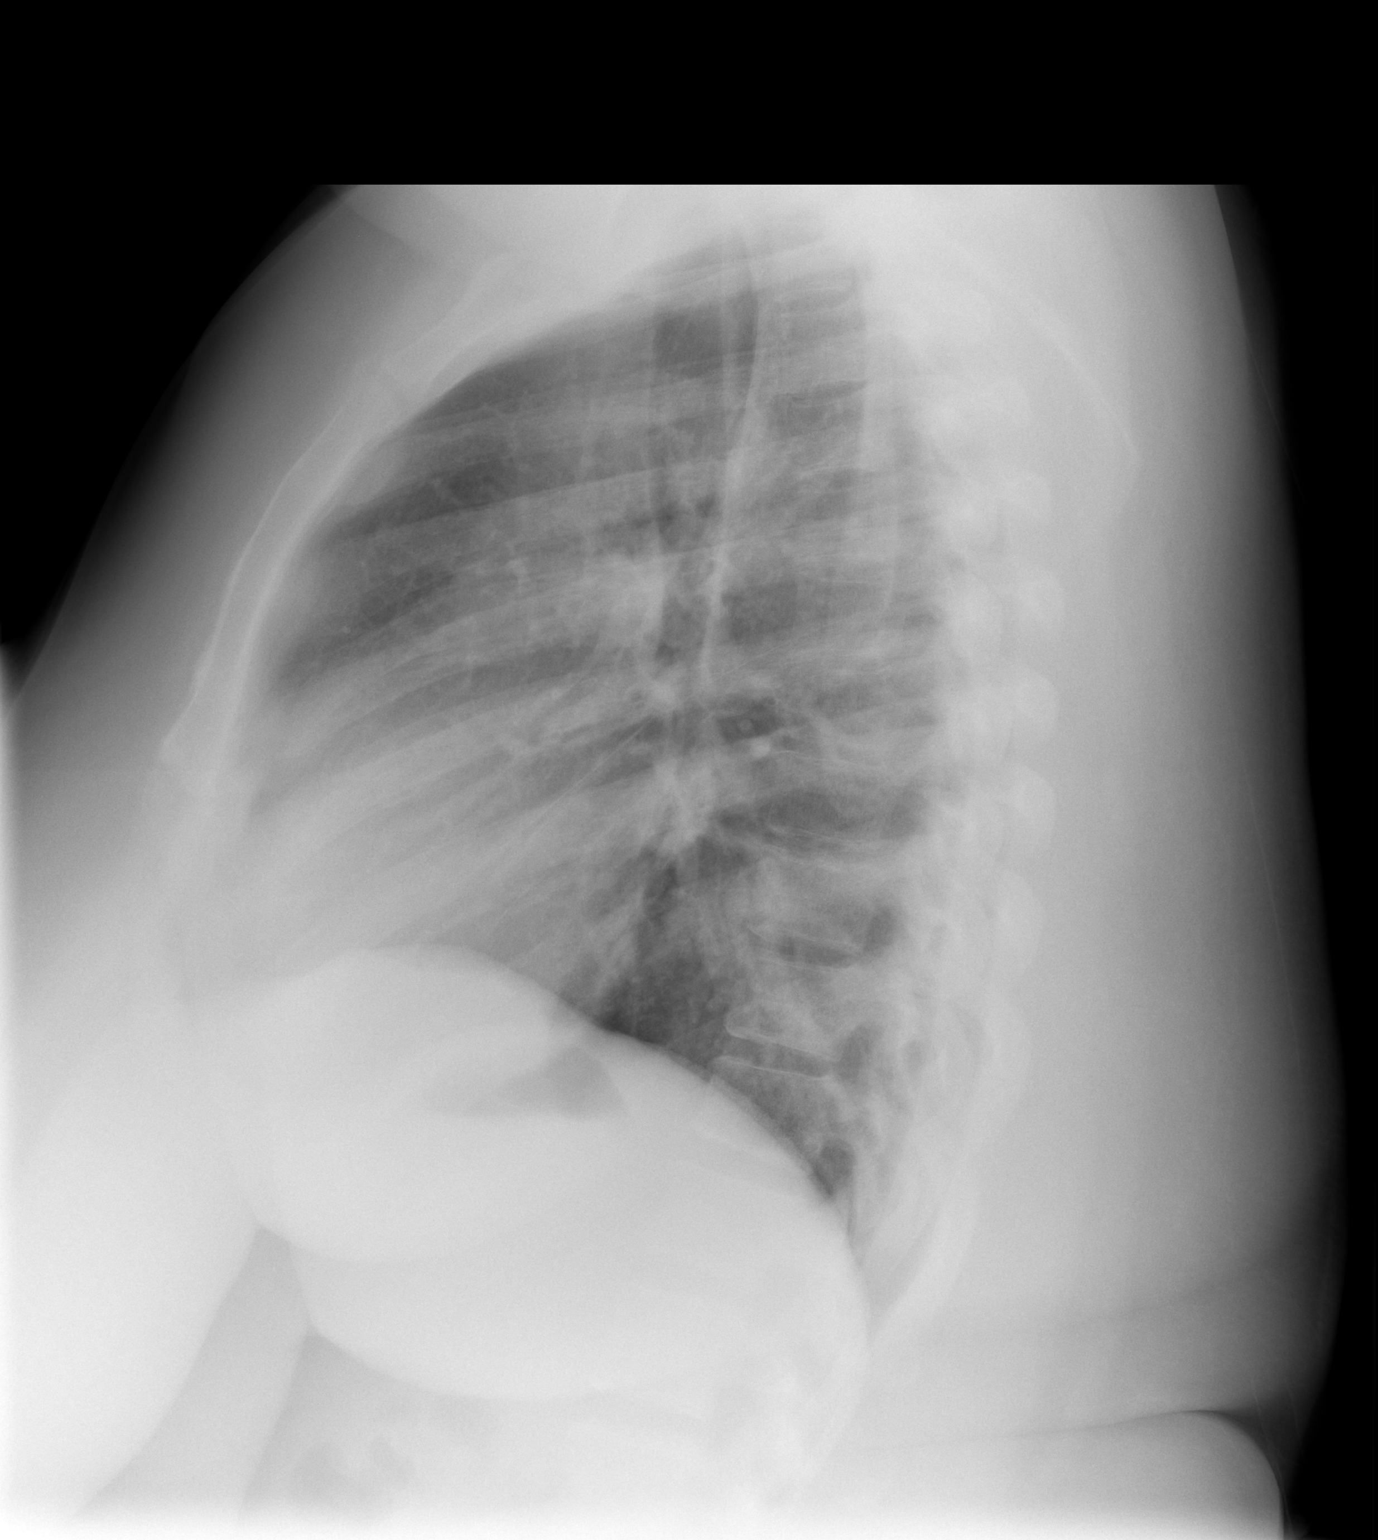

[2 of 2 positions shown; findings below may reference images not displayed]

FINDINGS: Peribronchial cuffing.  Negative for infiltrates or
effusions.  The heart is normal.  The osseous structures are normal
IMPRESSION: Chronic bronchitis.

## 2009-01-18 ENCOUNTER — Ambulatory Visit: Payer: Self-pay | Admitting: Internal Medicine

## 2009-01-18 ENCOUNTER — Encounter: Payer: Self-pay | Admitting: Internal Medicine

## 2009-01-20 ENCOUNTER — Ambulatory Visit: Payer: Self-pay | Admitting: Family Medicine

## 2009-02-01 ENCOUNTER — Ambulatory Visit: Payer: Self-pay | Admitting: Internal Medicine

## 2009-02-16 ENCOUNTER — Telehealth (INDEPENDENT_AMBULATORY_CARE_PROVIDER_SITE_OTHER): Payer: Self-pay | Admitting: *Deleted

## 2009-02-20 ENCOUNTER — Telehealth: Payer: Self-pay | Admitting: Internal Medicine

## 2009-02-22 ENCOUNTER — Telehealth: Payer: Self-pay | Admitting: Internal Medicine

## 2009-03-01 ENCOUNTER — Telehealth: Payer: Self-pay | Admitting: Internal Medicine

## 2009-03-02 ENCOUNTER — Encounter: Payer: Self-pay | Admitting: Internal Medicine

## 2009-03-03 ENCOUNTER — Ambulatory Visit: Payer: Self-pay | Admitting: Internal Medicine

## 2009-03-17 ENCOUNTER — Ambulatory Visit: Payer: Self-pay | Admitting: Internal Medicine

## 2009-03-29 IMAGING — CR DG KNEE COMPLETE 4+V*L*
4 series · 4 of 4 positions shown · non-contrast
Comparison: None

CLINICAL DATA: Knee pain

LEFT KNEE - COMPLETE 4+ VIEW

[t knee ap left]
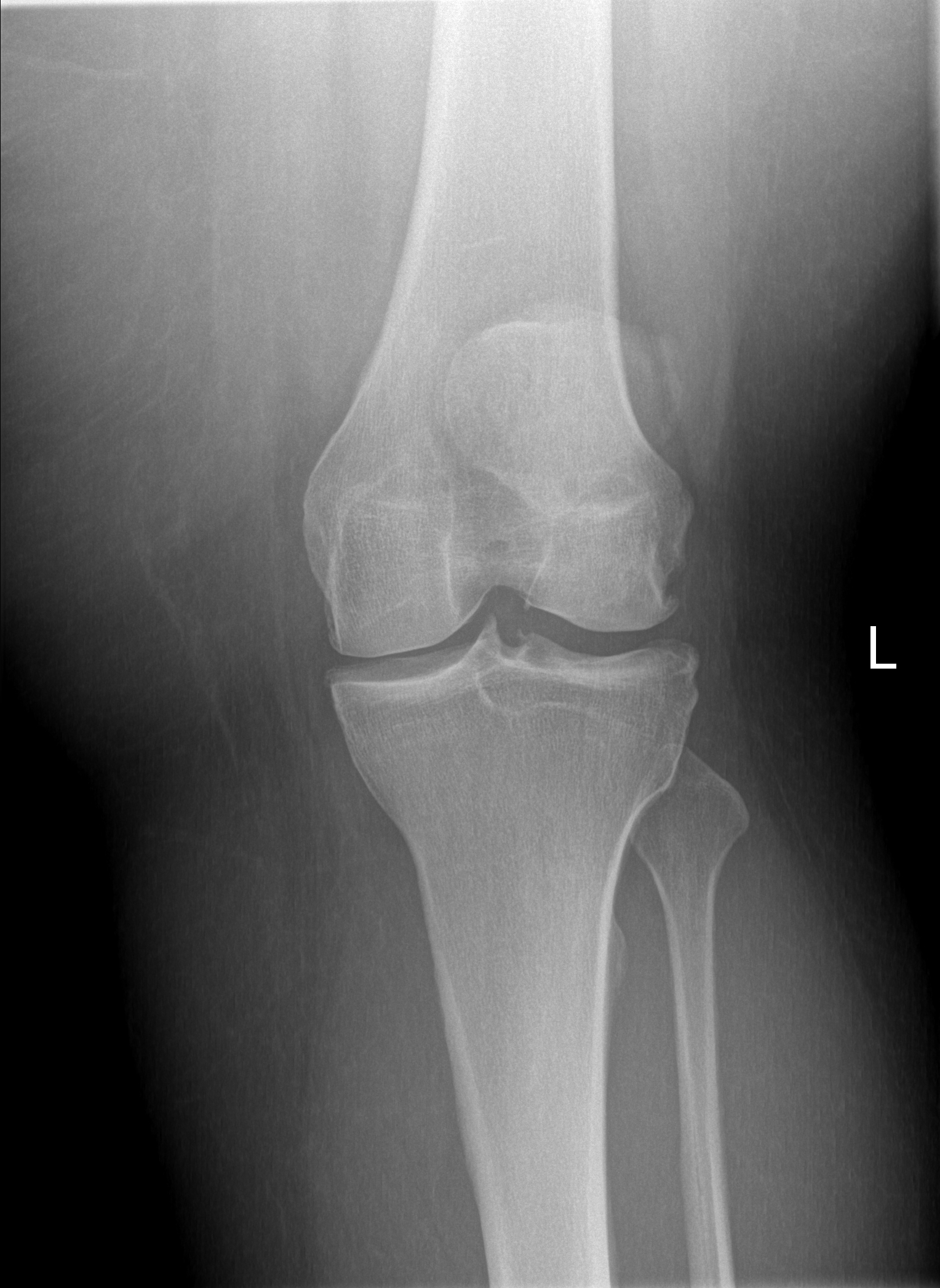

[t knee oblique left (1 of 2)]
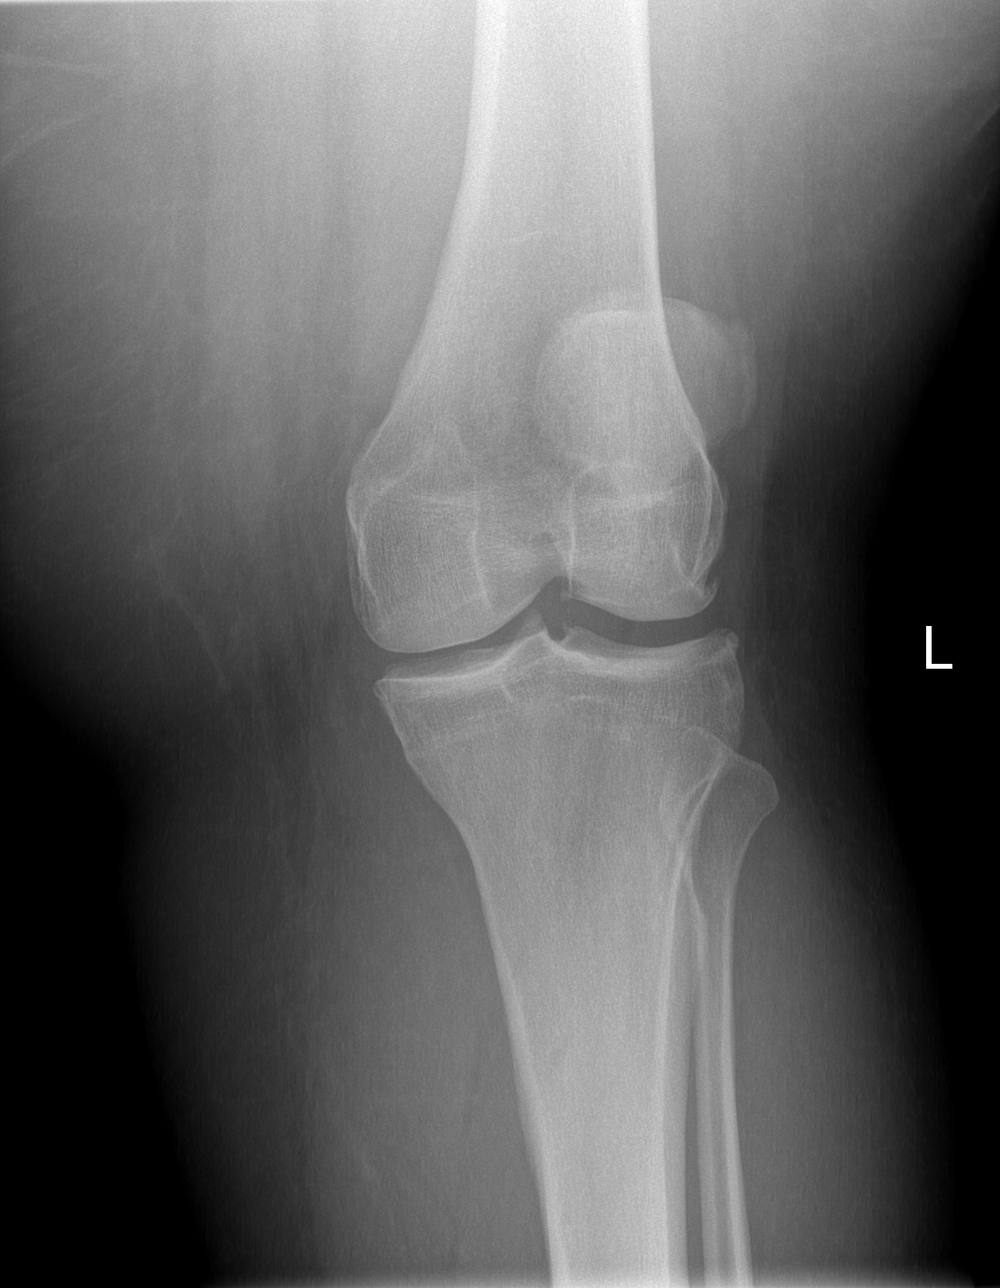

[t knee oblique left (2 of 2)]
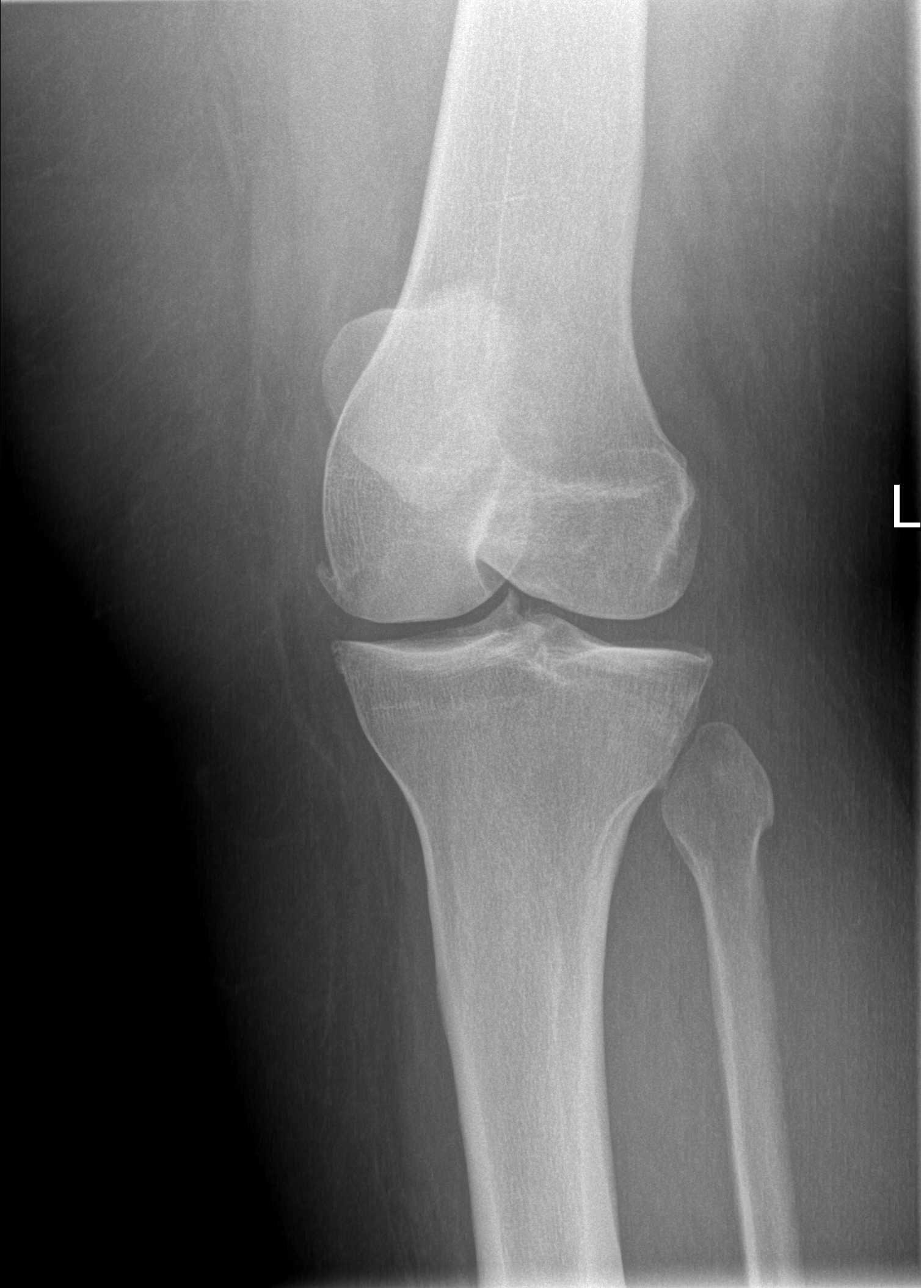

[t knee lat left]
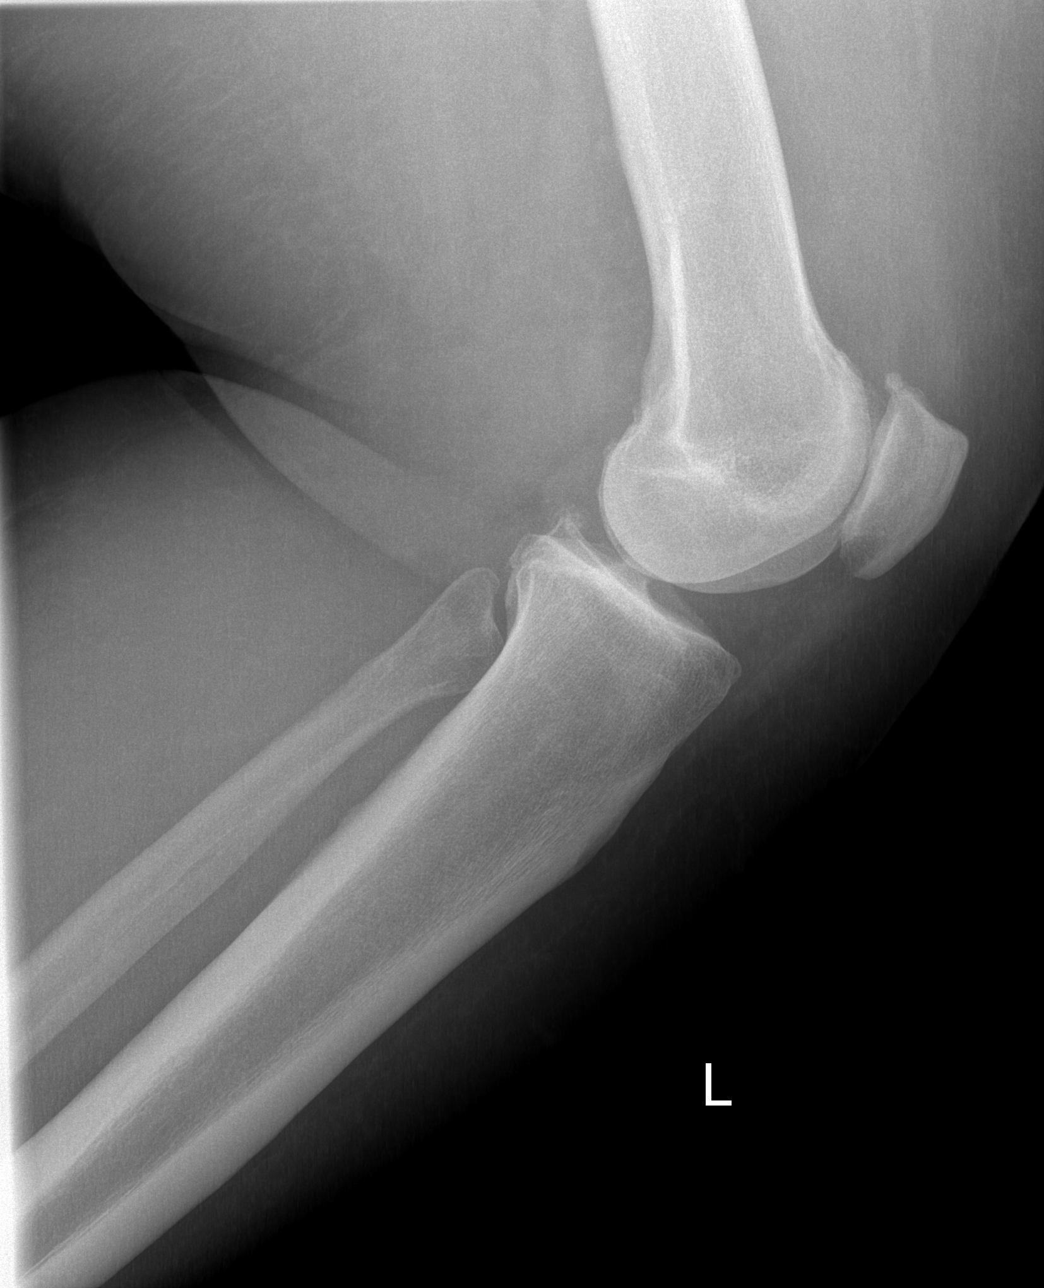

[4 of 4 positions shown; findings below may reference images not displayed]

FINDINGS: There is no joint effusion.

No fracture or dislocation noted.

Sharpening of the tibial spines and marginal spur formation is
noted.

There is joint space narrowing
IMPRESSION: 1.  Moderate degenerative joint disease.
2.  No acute findings.

## 2009-04-09 ENCOUNTER — Emergency Department (HOSPITAL_COMMUNITY): Admission: EM | Admit: 2009-04-09 | Discharge: 2009-04-09 | Payer: Self-pay | Admitting: Family Medicine

## 2009-04-17 ENCOUNTER — Telehealth: Payer: Self-pay | Admitting: Internal Medicine

## 2009-04-17 ENCOUNTER — Encounter (INDEPENDENT_AMBULATORY_CARE_PROVIDER_SITE_OTHER): Payer: Self-pay | Admitting: Internal Medicine

## 2009-04-17 ENCOUNTER — Ambulatory Visit: Payer: Self-pay | Admitting: Internal Medicine

## 2009-04-17 LAB — CONVERTED CEMR LAB
CO2: 23 meq/L (ref 19–32)
Chloride: 104 meq/L (ref 96–112)
Cholesterol: 179 mg/dL (ref 0–200)
Glucose, Bld: 102 mg/dL — ABNORMAL HIGH (ref 70–99)
LDL Cholesterol: 93 mg/dL (ref 0–99)
Potassium: 4.3 meq/L (ref 3.5–5.3)
Sodium: 140 meq/L (ref 135–145)
Total CHOL/HDL Ratio: 2.8
VLDL: 23 mg/dL (ref 0–40)

## 2009-04-18 ENCOUNTER — Ambulatory Visit: Payer: Self-pay | Admitting: Internal Medicine

## 2009-04-20 ENCOUNTER — Ambulatory Visit: Payer: Self-pay | Admitting: Internal Medicine

## 2009-04-20 ENCOUNTER — Encounter: Payer: Self-pay | Admitting: Internal Medicine

## 2009-04-20 ENCOUNTER — Encounter (INDEPENDENT_AMBULATORY_CARE_PROVIDER_SITE_OTHER): Payer: Self-pay | Admitting: Internal Medicine

## 2009-04-26 ENCOUNTER — Encounter: Payer: Self-pay | Admitting: Internal Medicine

## 2009-05-03 ENCOUNTER — Telehealth: Payer: Self-pay | Admitting: Internal Medicine

## 2009-05-03 ENCOUNTER — Encounter: Payer: Self-pay | Admitting: Internal Medicine

## 2009-05-05 ENCOUNTER — Encounter: Payer: Self-pay | Admitting: Internal Medicine

## 2009-05-08 ENCOUNTER — Emergency Department (HOSPITAL_COMMUNITY): Admission: EM | Admit: 2009-05-08 | Discharge: 2009-05-08 | Payer: Self-pay | Admitting: Family Medicine

## 2009-05-11 ENCOUNTER — Ambulatory Visit: Payer: Self-pay | Admitting: Internal Medicine

## 2009-05-22 ENCOUNTER — Ambulatory Visit (HOSPITAL_COMMUNITY): Admission: RE | Admit: 2009-05-22 | Discharge: 2009-05-22 | Payer: Self-pay | Admitting: Internal Medicine

## 2009-05-22 ENCOUNTER — Ambulatory Visit: Payer: Self-pay | Admitting: Internal Medicine

## 2009-05-22 ENCOUNTER — Telehealth (INDEPENDENT_AMBULATORY_CARE_PROVIDER_SITE_OTHER): Payer: Self-pay | Admitting: *Deleted

## 2009-05-24 ENCOUNTER — Encounter (INDEPENDENT_AMBULATORY_CARE_PROVIDER_SITE_OTHER): Payer: Self-pay | Admitting: Internal Medicine

## 2009-05-25 ENCOUNTER — Ambulatory Visit: Payer: Self-pay | Admitting: Internal Medicine

## 2009-05-26 DIAGNOSIS — J45909 Unspecified asthma, uncomplicated: Secondary | ICD-10-CM | POA: Insufficient documentation

## 2009-05-30 ENCOUNTER — Emergency Department (HOSPITAL_COMMUNITY): Admission: EM | Admit: 2009-05-30 | Discharge: 2009-05-30 | Payer: Self-pay | Admitting: Family Medicine

## 2009-06-09 ENCOUNTER — Ambulatory Visit: Payer: Self-pay | Admitting: Internal Medicine

## 2009-06-13 ENCOUNTER — Ambulatory Visit: Payer: Self-pay | Admitting: Internal Medicine

## 2009-06-13 ENCOUNTER — Telehealth: Payer: Self-pay | Admitting: Internal Medicine

## 2009-06-23 ENCOUNTER — Ambulatory Visit: Payer: Self-pay | Admitting: Internal Medicine

## 2009-07-07 ENCOUNTER — Telehealth: Payer: Self-pay | Admitting: Internal Medicine

## 2009-07-07 ENCOUNTER — Ambulatory Visit: Payer: Self-pay | Admitting: Internal Medicine

## 2009-07-21 ENCOUNTER — Ambulatory Visit: Payer: Self-pay | Admitting: Internal Medicine

## 2009-08-04 ENCOUNTER — Ambulatory Visit: Payer: Self-pay | Admitting: Internal Medicine

## 2009-08-11 ENCOUNTER — Ambulatory Visit: Payer: Self-pay | Admitting: Internal Medicine

## 2009-08-16 ENCOUNTER — Ambulatory Visit: Payer: Self-pay | Admitting: Internal Medicine

## 2009-08-23 ENCOUNTER — Emergency Department (HOSPITAL_COMMUNITY): Admission: EM | Admit: 2009-08-23 | Discharge: 2009-08-23 | Payer: Self-pay | Admitting: Family Medicine

## 2009-08-30 ENCOUNTER — Emergency Department (HOSPITAL_COMMUNITY): Admission: EM | Admit: 2009-08-30 | Discharge: 2009-08-30 | Payer: Self-pay | Admitting: Emergency Medicine

## 2009-09-01 ENCOUNTER — Ambulatory Visit: Payer: Self-pay | Admitting: Internal Medicine

## 2009-09-14 IMAGING — CR DG SHOULDER 2+V*L*
3 series · 3 of 3 positions shown · non-contrast
Comparison: 05/22/2007 and 11/29/2005 chest x-rays.

CLINICAL DATA: Left shoulder pain.

LEFT SHOULDER - 2+ VIEW

[view not recorded (1 of 3)]
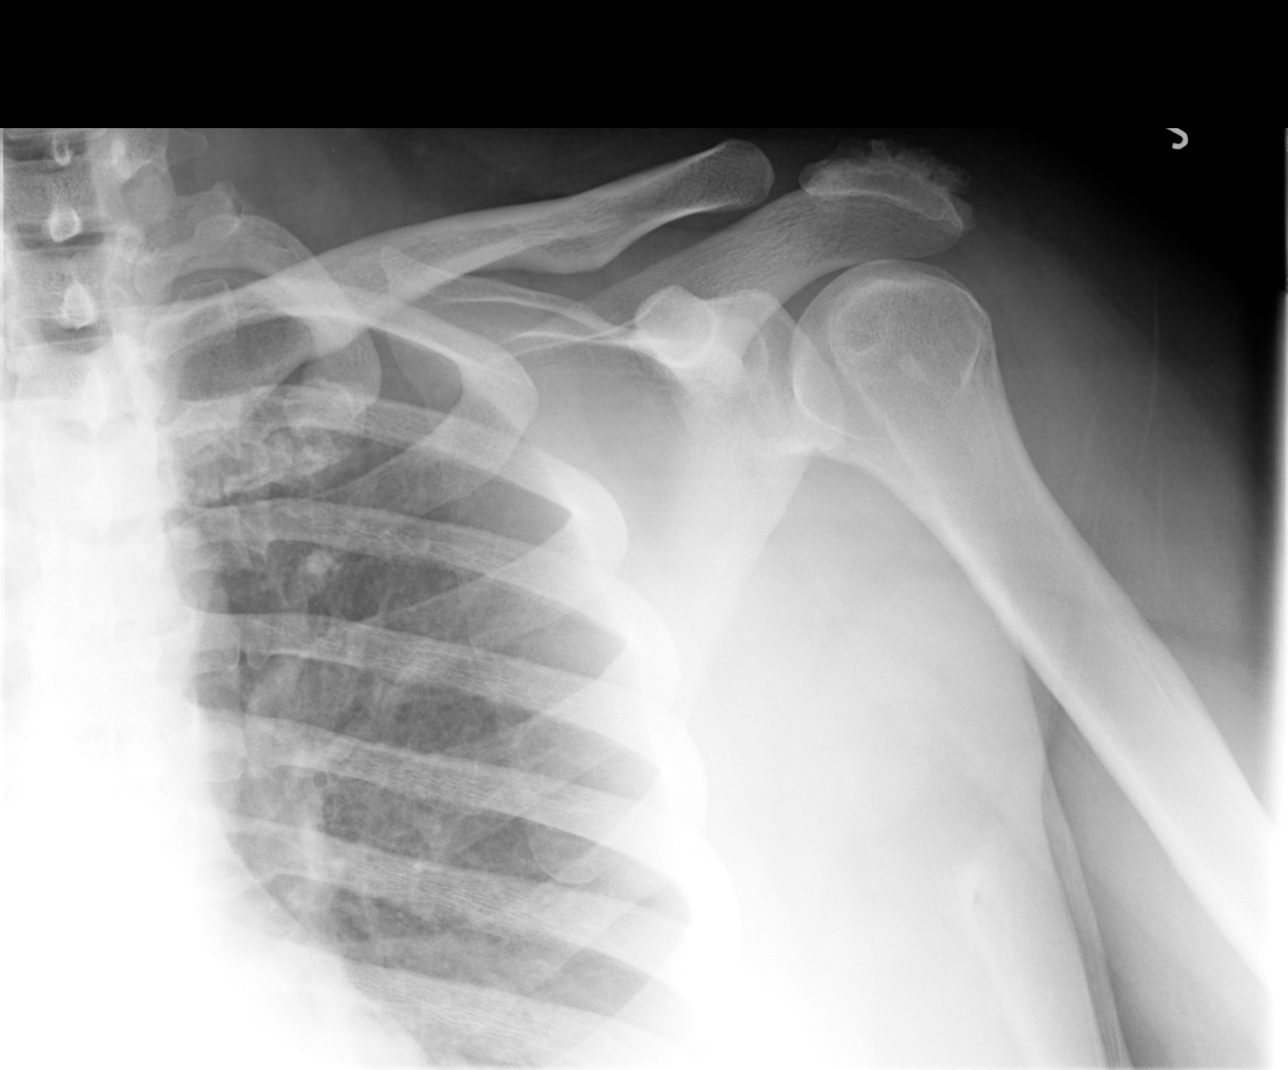

[view not recorded (2 of 3)]
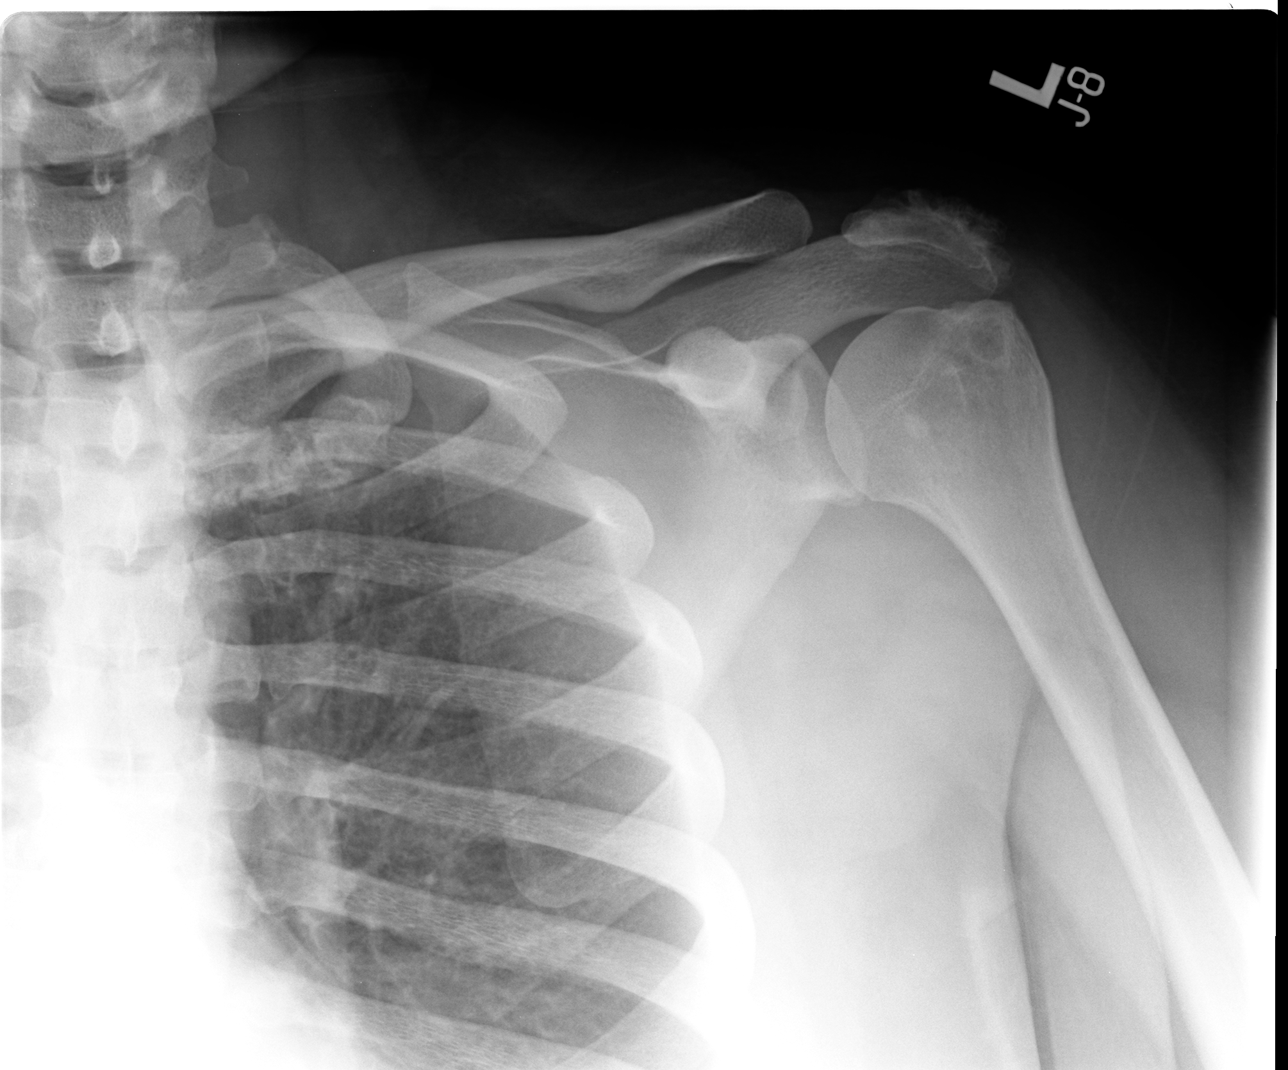

[view not recorded (3 of 3)]
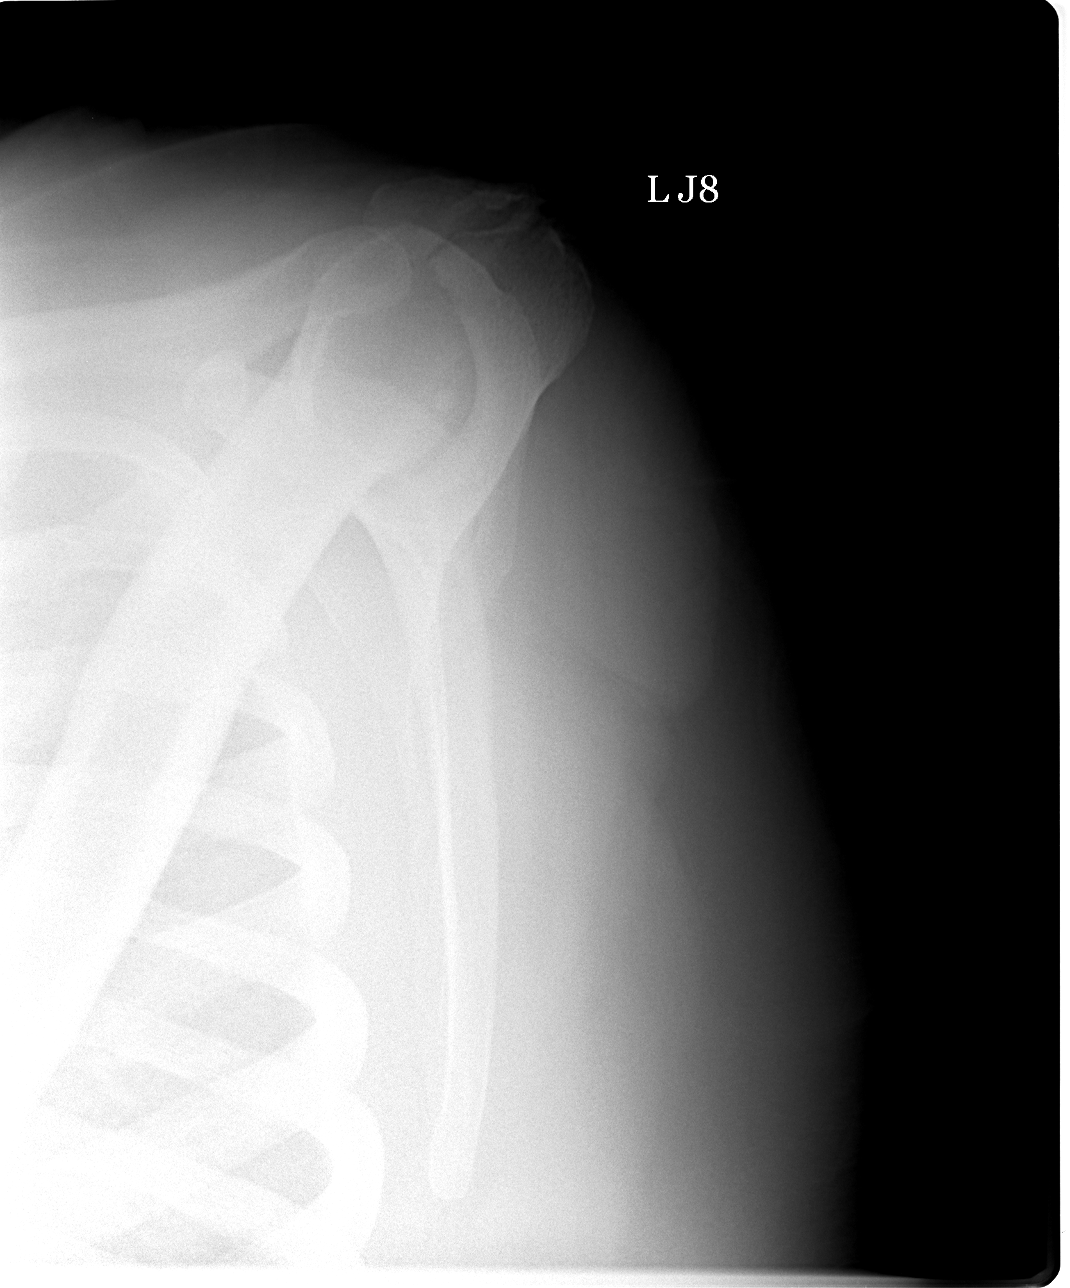

[3 of 3 positions shown; findings below may reference images not displayed]

FINDINGS: There is no evidence of acute fracture, subluxation, or
dislocation.
Irregularity of the superior acromion is unchanged from 6111,
question post-traumatic.
Mild lateral downsloping of the acromion is noted.
The visualized left hemithorax is unremarkable.
IMPRESSION: No evidence of acute abnormality.

Lateral downsloping of the acromion.  If there is clinical
suspicion for rotator cuff abnormality, consider MRI.

## 2009-09-15 ENCOUNTER — Ambulatory Visit: Payer: Self-pay | Admitting: Internal Medicine

## 2009-09-20 ENCOUNTER — Telehealth: Payer: Self-pay | Admitting: *Deleted

## 2009-09-28 ENCOUNTER — Ambulatory Visit: Payer: Self-pay | Admitting: Internal Medicine

## 2009-10-06 ENCOUNTER — Encounter: Payer: Self-pay | Admitting: Licensed Clinical Social Worker

## 2009-10-06 ENCOUNTER — Ambulatory Visit: Payer: Self-pay | Admitting: Internal Medicine

## 2009-10-06 LAB — CONVERTED CEMR LAB
AST: 15 units/L (ref 0–37)
Albumin: 3.6 g/dL (ref 3.5–5.2)
Alkaline Phosphatase: 60 units/L (ref 39–117)
BUN: 6 mg/dL (ref 6–23)
HDL: 47 mg/dL (ref >39)
LDL Cholesterol: 123 mg/dL — ABNORMAL HIGH (ref 0–99)
Potassium: 4 meq/L (ref 3.5–5.3)
TSH: 2.484 microintl units/mL (ref 0.350–4.5)
Total CHOL/HDL Ratio: 3.9

## 2009-10-10 ENCOUNTER — Encounter (INDEPENDENT_AMBULATORY_CARE_PROVIDER_SITE_OTHER): Payer: Self-pay | Admitting: Internal Medicine

## 2009-10-11 ENCOUNTER — Ambulatory Visit: Payer: Self-pay | Admitting: Internal Medicine

## 2009-10-12 ENCOUNTER — Telehealth (INDEPENDENT_AMBULATORY_CARE_PROVIDER_SITE_OTHER): Payer: Self-pay | Admitting: *Deleted

## 2009-10-14 IMAGING — CR DG CHEST 2V
2 series · 2 of 2 positions shown · non-contrast
Comparison: 12/31/2007

CLINICAL DATA: Asthma exacerbation.  Hypertension.

CHEST - 2 VIEW

[w chest pa]
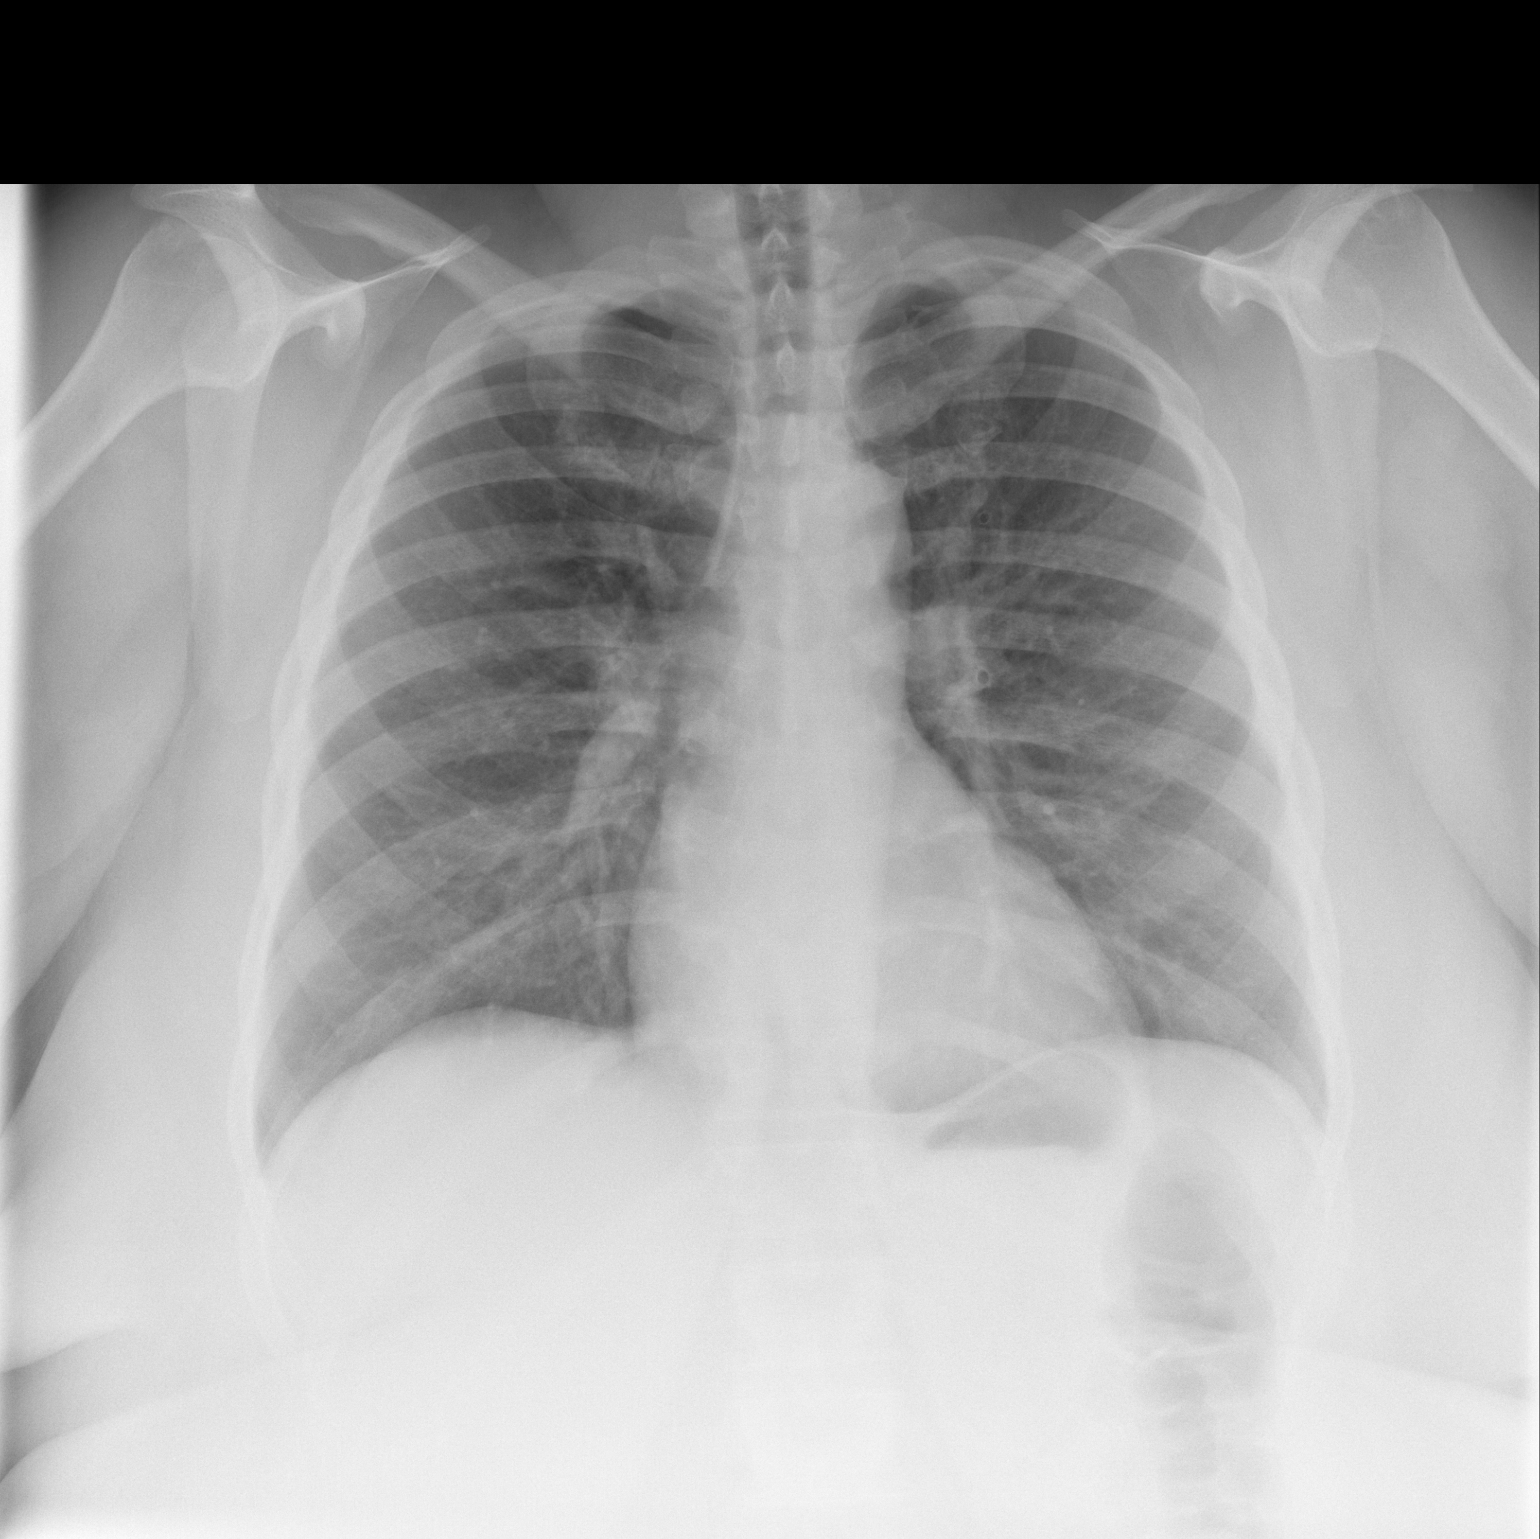

[w chest lat]
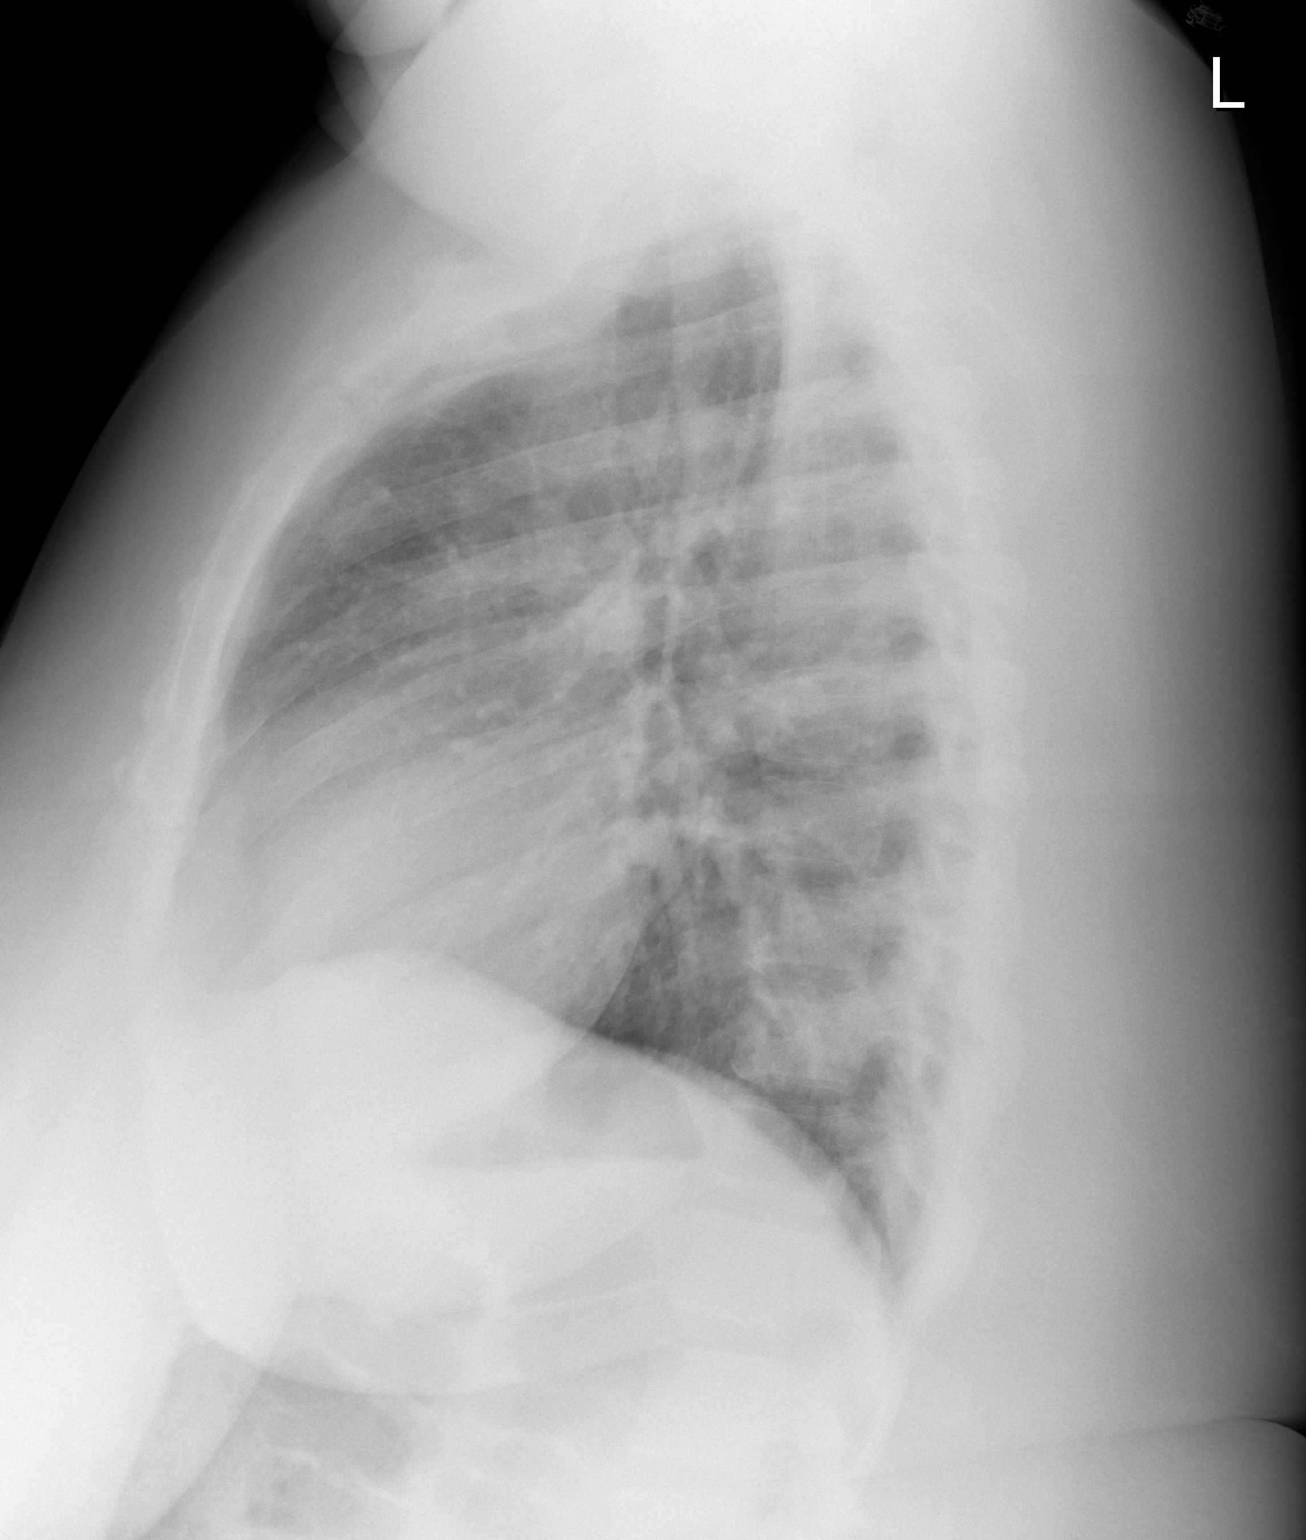

[2 of 2 positions shown; findings below may reference images not displayed]

FINDINGS: Heart size is normal.  The mediastinum is unremarkable.
There is bronchial thickening but there is no infiltrate, collapse
or effusion.  Bony structures are unremarkable.
IMPRESSION: Bronchial thickening without consolidation or collapse.

## 2009-10-16 ENCOUNTER — Emergency Department (HOSPITAL_COMMUNITY): Admission: EM | Admit: 2009-10-16 | Discharge: 2009-10-16 | Payer: Self-pay | Admitting: Family Medicine

## 2009-10-27 ENCOUNTER — Ambulatory Visit: Payer: Self-pay | Admitting: Internal Medicine

## 2009-11-06 ENCOUNTER — Telehealth (INDEPENDENT_AMBULATORY_CARE_PROVIDER_SITE_OTHER): Payer: Self-pay | Admitting: *Deleted

## 2009-11-07 ENCOUNTER — Encounter: Payer: Self-pay | Admitting: Adult Health

## 2009-11-07 ENCOUNTER — Ambulatory Visit: Payer: Self-pay | Admitting: Internal Medicine

## 2009-11-10 ENCOUNTER — Ambulatory Visit: Payer: Self-pay | Admitting: Internal Medicine

## 2009-11-24 ENCOUNTER — Ambulatory Visit: Payer: Self-pay | Admitting: Internal Medicine

## 2009-12-06 ENCOUNTER — Ambulatory Visit: Payer: Self-pay | Admitting: Internal Medicine

## 2009-12-10 IMAGING — CR DG CHEST 2V
2 series · 2 of 2 positions shown · non-contrast
Comparison: 09/28/2008.

CLINICAL DATA: Asthma.  Shortness of breath and wheezing.

CHEST - 2 VIEW

[w chest pa]
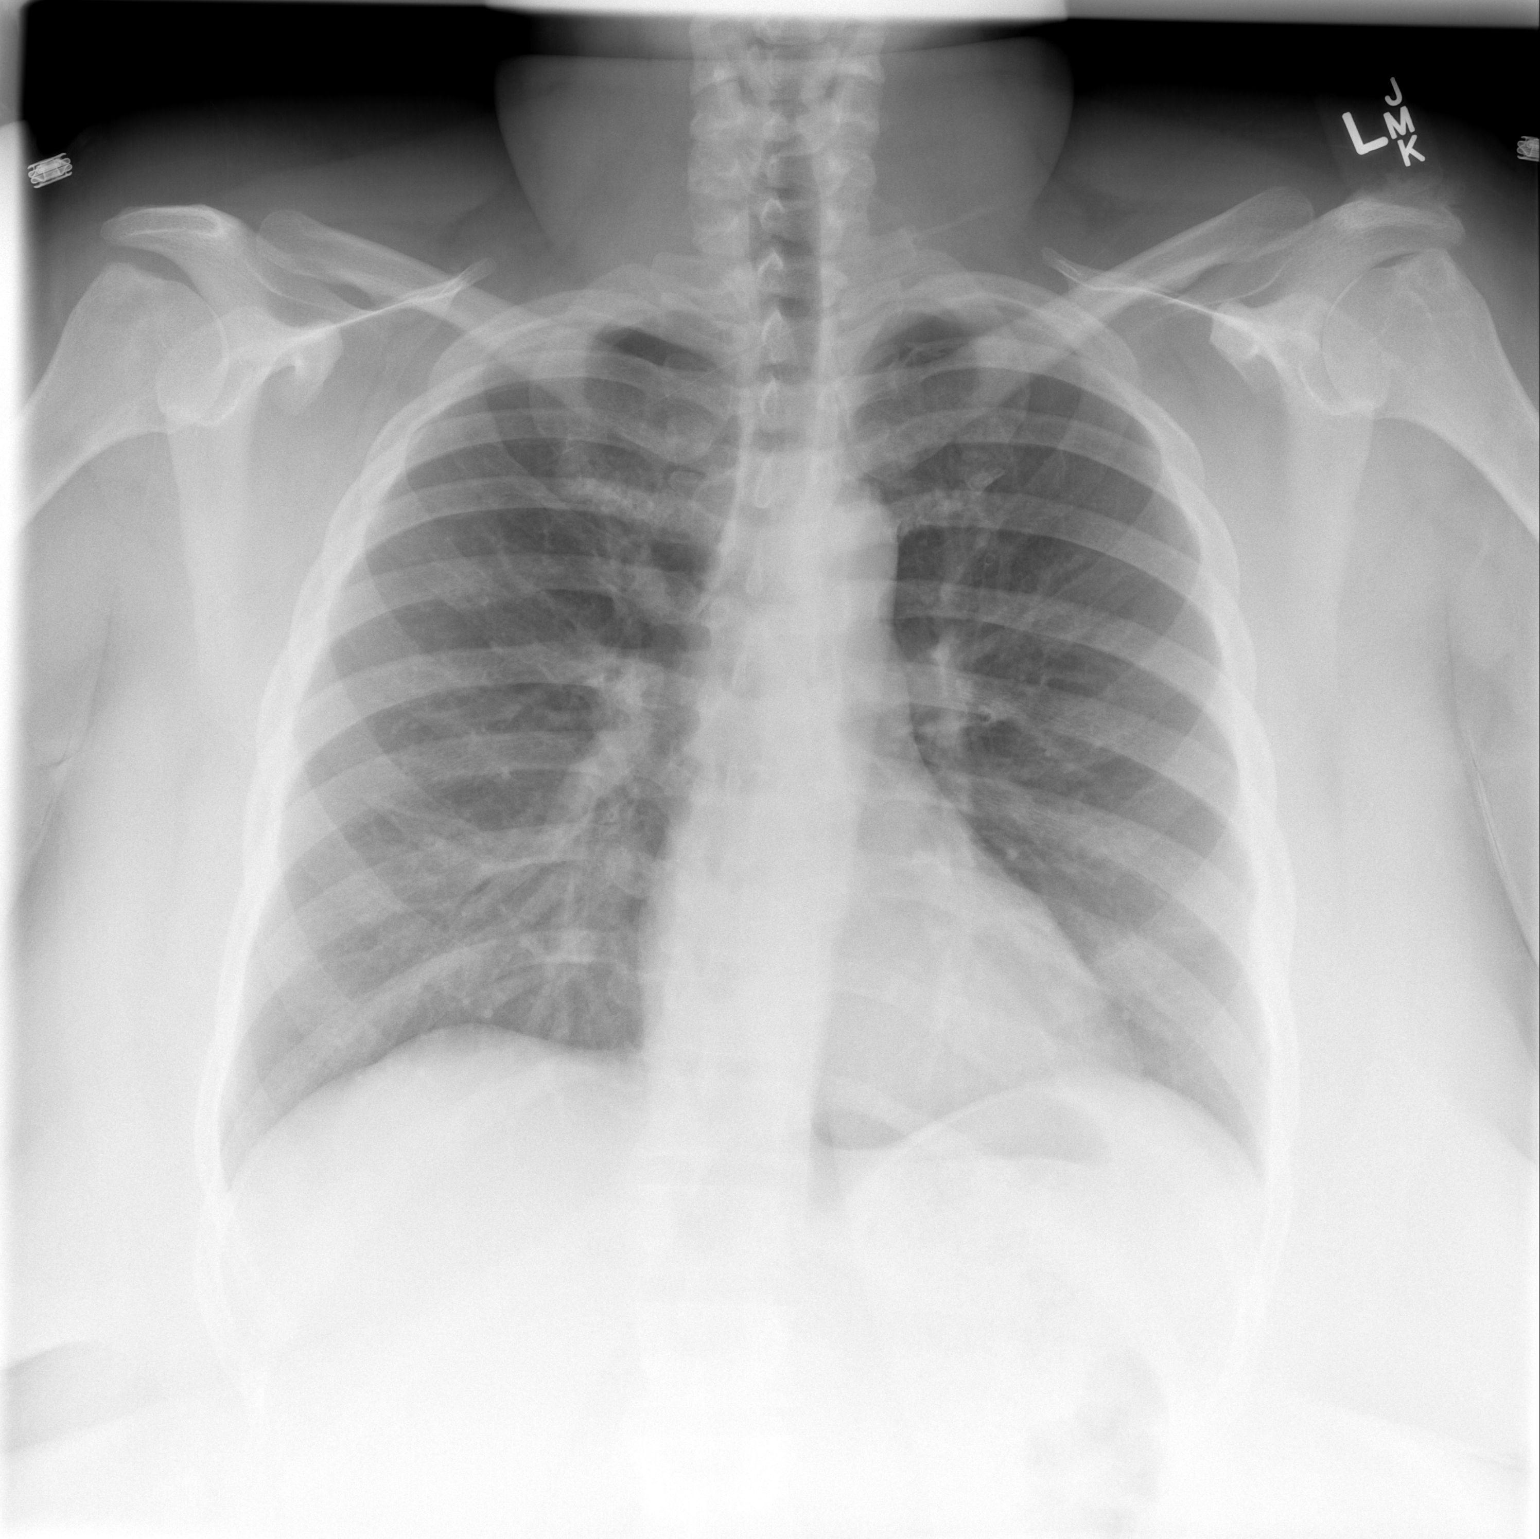

[w chest lat *]
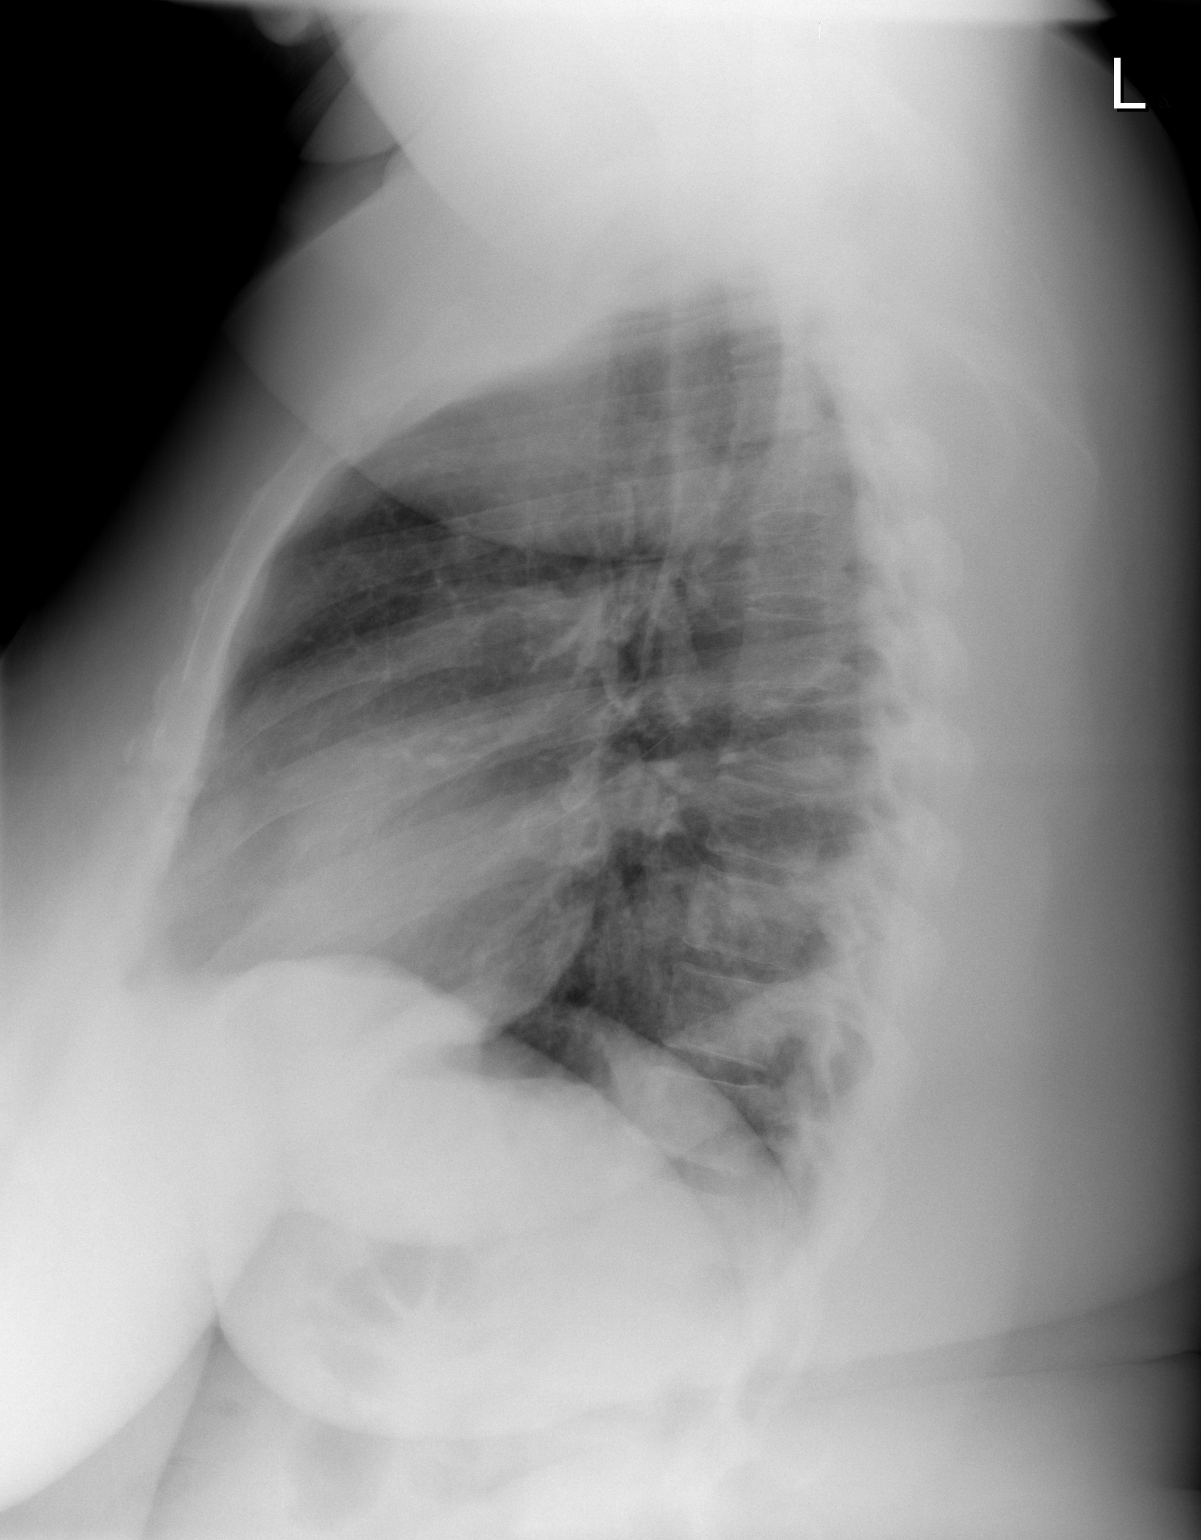

[2 of 2 positions shown; findings below may reference images not displayed]

FINDINGS: Chronic bronchitic changes are stable.  The lungs are not
hyperinflated.  Minimal central airway thickening is stable.  The
visualized soft tissues and bony thorax are unremarkable.  No focal
airspace disease is present.
IMPRESSION: 1.  Stable chronic bronchitic changes.
2.  No acute cardiopulmonary disease.

## 2009-12-13 ENCOUNTER — Telehealth (INDEPENDENT_AMBULATORY_CARE_PROVIDER_SITE_OTHER): Payer: Self-pay | Admitting: *Deleted

## 2009-12-16 IMAGING — CR DG CHEST 2V
2 series · 2 of 2 positions shown · non-contrast
Comparison: Chest 11/24/2008.

CLINICAL DATA: Shortness of breath.

CHEST - 2 VIEW

[w chest pa]
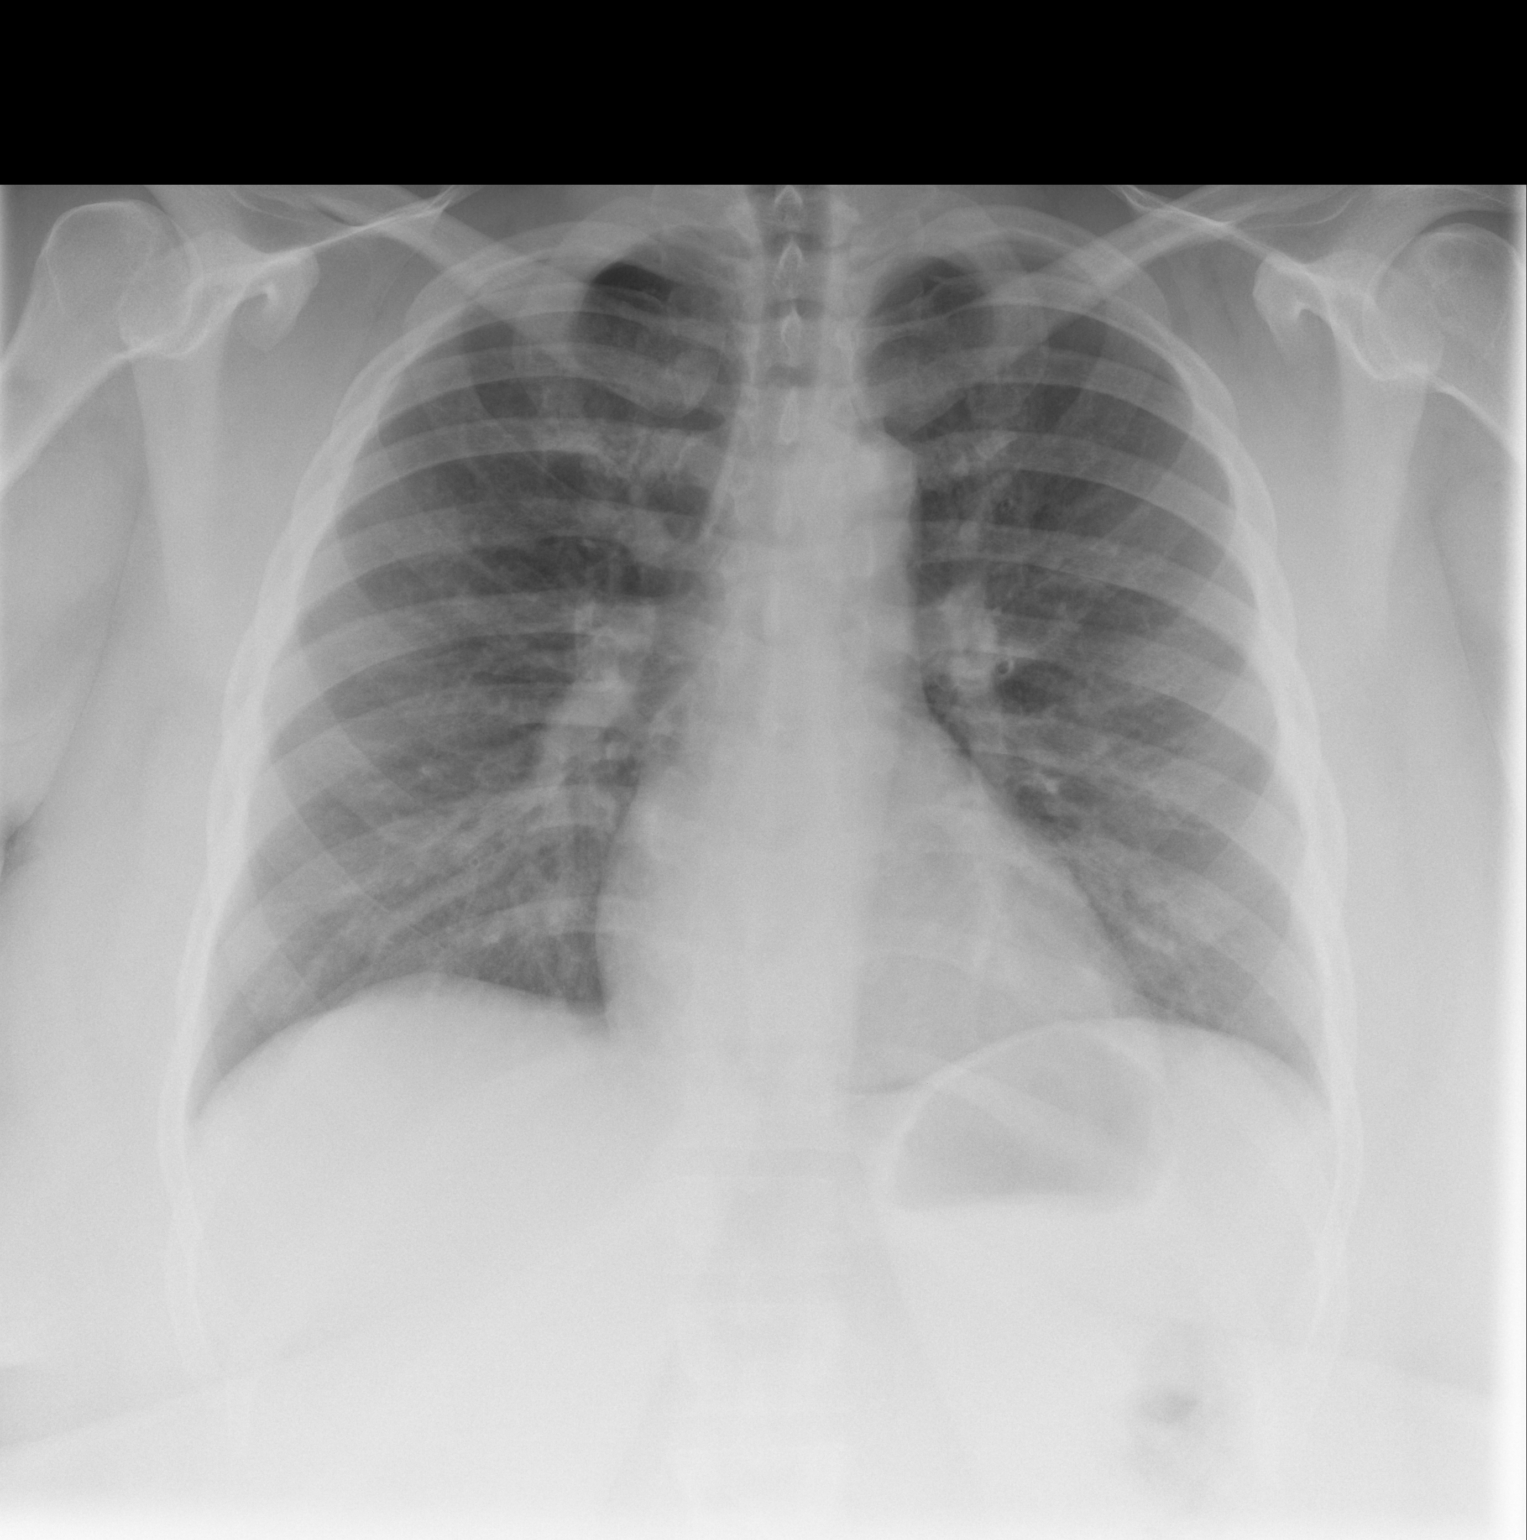

[w chest lat]
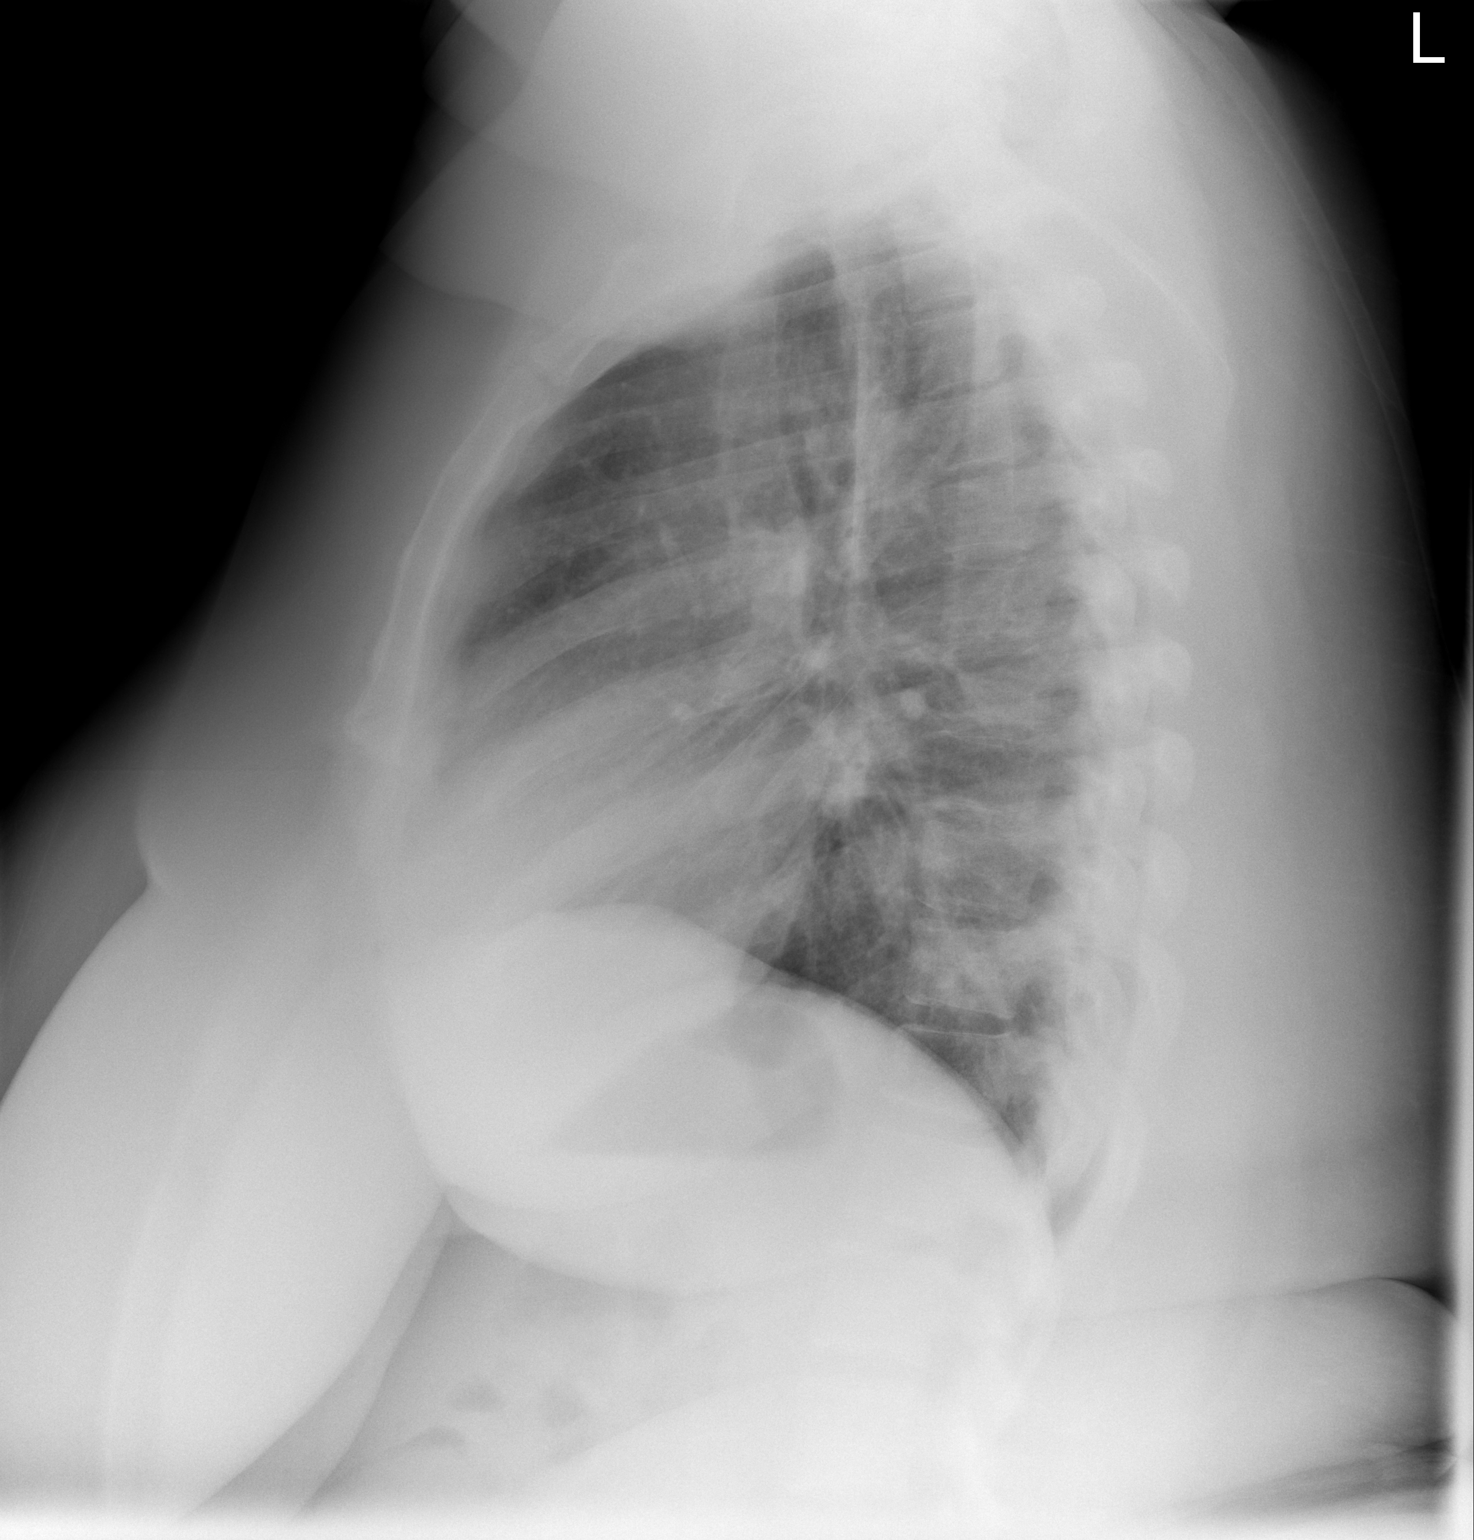

[2 of 2 positions shown; findings below may reference images not displayed]

FINDINGS: There is some peribronchial thickening.  No focal
airspace disease or effusion.  Heart size normal.
IMPRESSION: Peribronchial thickening compatible with bronchitis and/or asthma.
No consolidative process.

## 2009-12-21 ENCOUNTER — Telehealth: Payer: Self-pay | Admitting: Internal Medicine

## 2009-12-22 ENCOUNTER — Ambulatory Visit: Payer: Self-pay | Admitting: Internal Medicine

## 2009-12-22 ENCOUNTER — Telehealth (INDEPENDENT_AMBULATORY_CARE_PROVIDER_SITE_OTHER): Payer: Self-pay | Admitting: *Deleted

## 2009-12-27 ENCOUNTER — Ambulatory Visit: Payer: Self-pay | Admitting: Internal Medicine

## 2010-01-08 ENCOUNTER — Ambulatory Visit: Payer: Self-pay | Admitting: Internal Medicine

## 2010-01-12 ENCOUNTER — Encounter
Admission: RE | Admit: 2010-01-12 | Discharge: 2010-04-12 | Payer: Self-pay | Source: Home / Self Care | Attending: Internal Medicine | Admitting: Internal Medicine

## 2010-01-17 ENCOUNTER — Telehealth (INDEPENDENT_AMBULATORY_CARE_PROVIDER_SITE_OTHER): Payer: Self-pay | Admitting: *Deleted

## 2010-01-19 ENCOUNTER — Ambulatory Visit: Payer: Self-pay | Admitting: Internal Medicine

## 2010-01-20 IMAGING — CR DG ANKLE COMPLETE 3+V*L*
3 series · 3 of 3 positions shown · non-contrast
Comparison: None

CLINICAL DATA: Pain.  No trauma.  Lateral.  Morbid obesity.

LEFT ANKLE COMPLETE - 3+ VIEW

[view not recorded (1 of 3)]
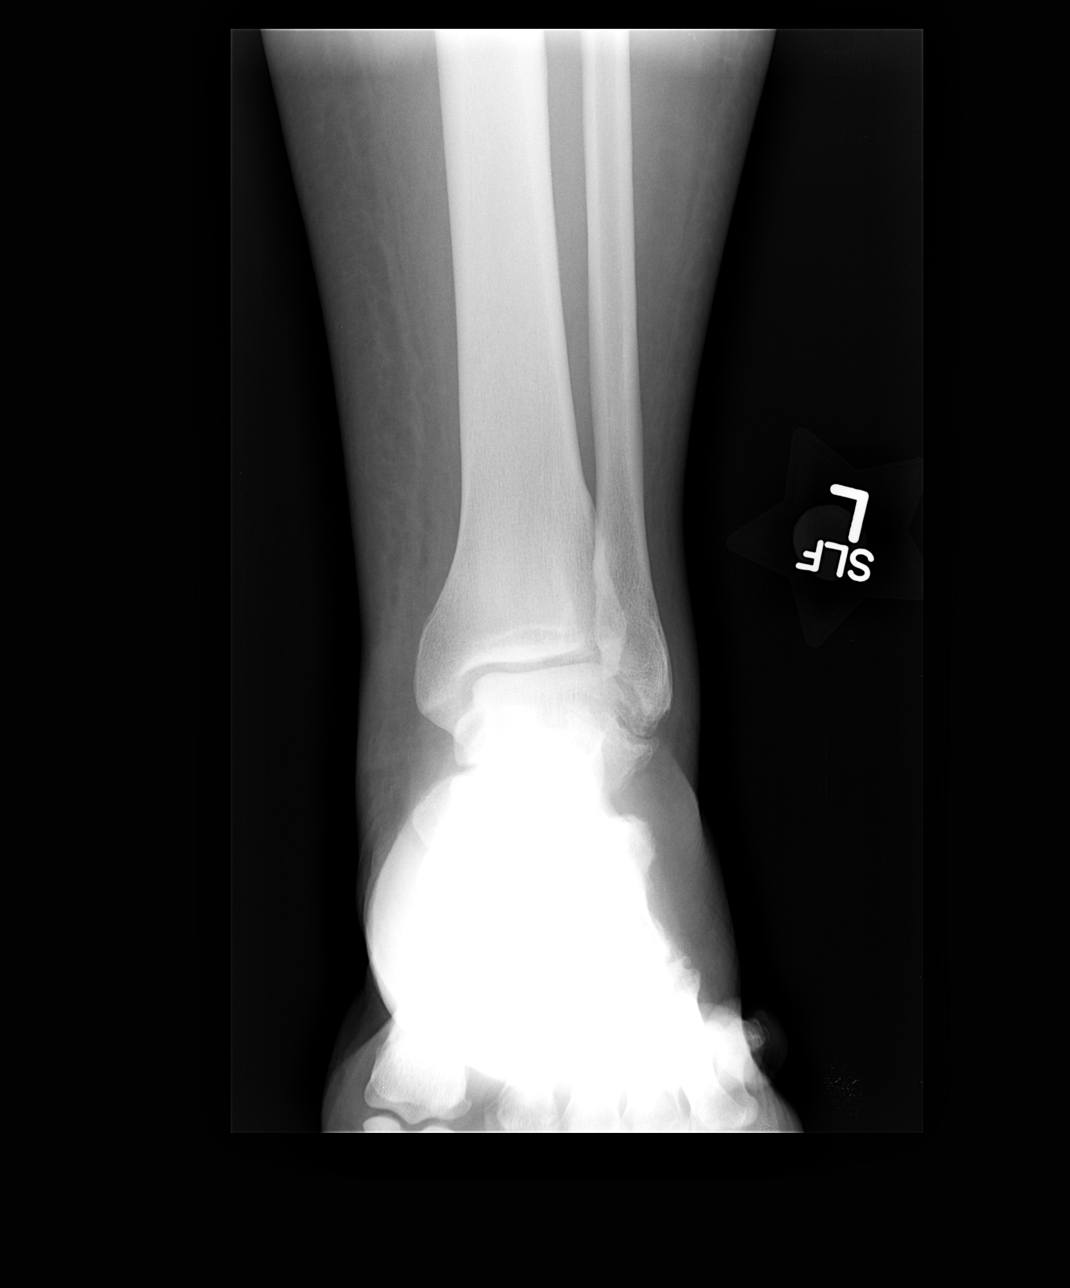

[view not recorded (2 of 3)]
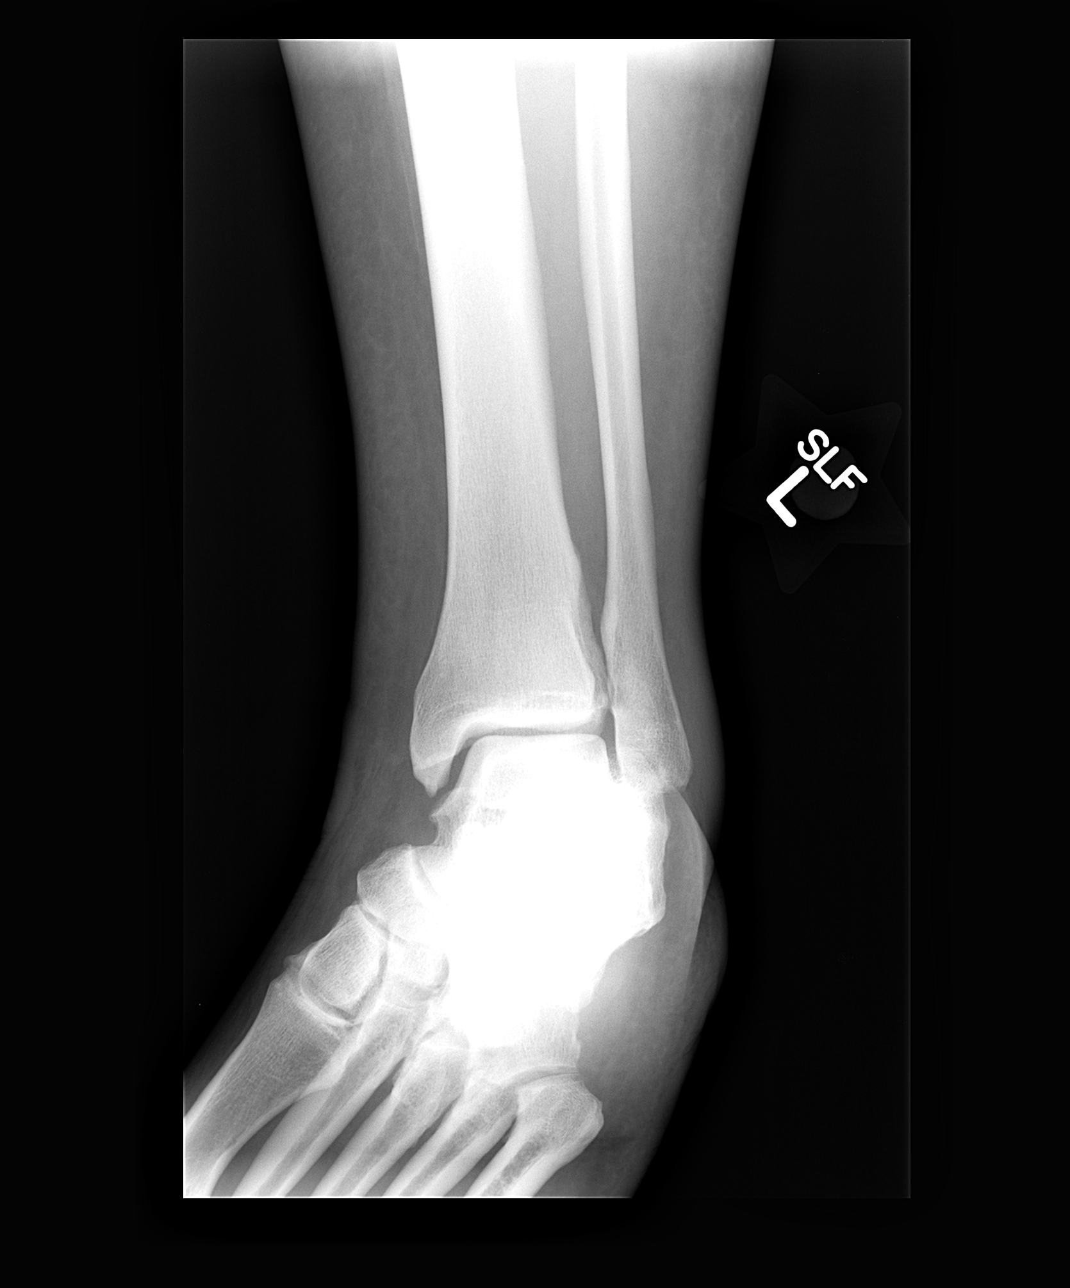

[view not recorded (3 of 3)]
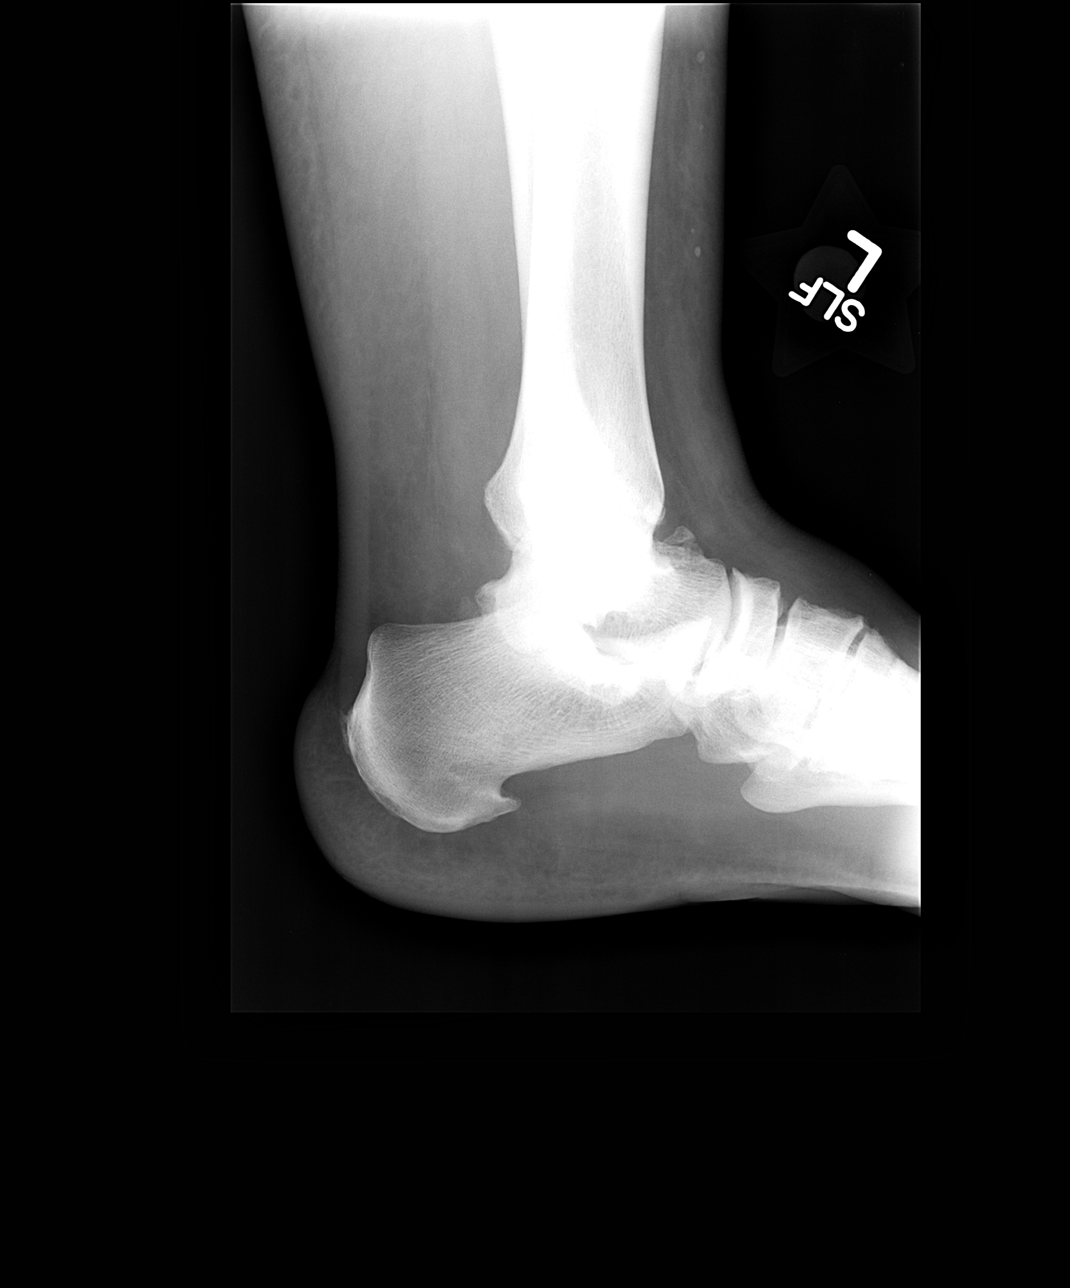

[3 of 3 positions shown; findings below may reference images not displayed]

FINDINGS: Apparent medial greater than lateral soft tissue swelling
could be due to patient's size.  Age advanced degenerative changes
involve the lateral aspect of the ankle joint.  More mild medial
compartment osteoarthritis. No acute fracture or dislocation.
Talar dome and base of fifth metatarsal intact.  Prominent
osteophytes about the anterior aspect of the talus on the lateral
view.  Small Achilles and calcaneal spurs.
IMPRESSION: Moderate, age advanced degenerative change without acute osseous
abnormality.  Soft tissue swelling versus artifactual due to the
patient's size.

## 2010-01-22 ENCOUNTER — Emergency Department (HOSPITAL_COMMUNITY)
Admission: EM | Admit: 2010-01-22 | Discharge: 2010-01-22 | Payer: Self-pay | Source: Home / Self Care | Admitting: Family Medicine

## 2010-01-25 ENCOUNTER — Emergency Department (HOSPITAL_COMMUNITY): Admission: EM | Admit: 2010-01-25 | Discharge: 2010-01-25 | Payer: Self-pay | Admitting: Emergency Medicine

## 2010-01-28 ENCOUNTER — Emergency Department (HOSPITAL_COMMUNITY): Admission: EM | Admit: 2010-01-28 | Discharge: 2010-01-28 | Payer: Self-pay | Admitting: Family Medicine

## 2010-01-28 ENCOUNTER — Emergency Department (HOSPITAL_COMMUNITY): Admission: EM | Admit: 2010-01-28 | Discharge: 2010-01-28 | Payer: Self-pay | Admitting: Emergency Medicine

## 2010-01-31 ENCOUNTER — Ambulatory Visit: Payer: Self-pay | Admitting: Internal Medicine

## 2010-02-02 ENCOUNTER — Telehealth (INDEPENDENT_AMBULATORY_CARE_PROVIDER_SITE_OTHER): Payer: Self-pay | Admitting: *Deleted

## 2010-02-02 ENCOUNTER — Encounter: Payer: Self-pay | Admitting: Internal Medicine

## 2010-02-08 ENCOUNTER — Telehealth: Payer: Self-pay | Admitting: Internal Medicine

## 2010-02-14 ENCOUNTER — Telehealth (INDEPENDENT_AMBULATORY_CARE_PROVIDER_SITE_OTHER): Payer: Self-pay | Admitting: *Deleted

## 2010-02-15 ENCOUNTER — Ambulatory Visit: Payer: Self-pay | Admitting: Internal Medicine

## 2010-02-28 ENCOUNTER — Observation Stay (HOSPITAL_COMMUNITY): Admission: EM | Admit: 2010-02-28 | Discharge: 2010-02-28 | Payer: Self-pay | Admitting: Emergency Medicine

## 2010-02-28 ENCOUNTER — Encounter: Payer: Self-pay | Admitting: Internal Medicine

## 2010-03-01 ENCOUNTER — Telehealth: Payer: Self-pay | Admitting: Internal Medicine

## 2010-03-01 ENCOUNTER — Ambulatory Visit: Payer: Self-pay | Admitting: Internal Medicine

## 2010-03-04 ENCOUNTER — Encounter: Payer: Self-pay | Admitting: Internal Medicine

## 2010-03-04 ENCOUNTER — Inpatient Hospital Stay (HOSPITAL_COMMUNITY): Admission: EM | Admit: 2010-03-04 | Discharge: 2010-03-05 | Payer: Self-pay | Admitting: Emergency Medicine

## 2010-03-05 ENCOUNTER — Telehealth (INDEPENDENT_AMBULATORY_CARE_PROVIDER_SITE_OTHER): Payer: Self-pay | Admitting: *Deleted

## 2010-03-05 ENCOUNTER — Encounter: Payer: Self-pay | Admitting: Internal Medicine

## 2010-03-06 ENCOUNTER — Telehealth (INDEPENDENT_AMBULATORY_CARE_PROVIDER_SITE_OTHER): Payer: Self-pay | Admitting: *Deleted

## 2010-03-07 ENCOUNTER — Telehealth: Payer: Self-pay | Admitting: *Deleted

## 2010-03-08 ENCOUNTER — Ambulatory Visit: Payer: Self-pay | Admitting: Internal Medicine

## 2010-03-08 ENCOUNTER — Telehealth: Payer: Self-pay | Admitting: Internal Medicine

## 2010-03-08 DIAGNOSIS — D649 Anemia, unspecified: Secondary | ICD-10-CM | POA: Insufficient documentation

## 2010-03-08 LAB — CONVERTED CEMR LAB
Ferritin: 7 ng/mL — ABNORMAL LOW (ref 10–291)
MCHC: 31 g/dL (ref 30.0–36.0)
Potassium: 4 meq/L (ref 3.5–5.3)
RBC: 4.21 M/uL (ref 3.87–5.11)
RDW: 17.3 % — ABNORMAL HIGH (ref 11.5–15.5)
Sodium: 138 meq/L (ref 135–145)
Vitamin B-12: 279 pg/mL (ref 211–911)

## 2010-03-14 ENCOUNTER — Ambulatory Visit: Payer: Self-pay | Admitting: Internal Medicine

## 2010-03-14 DIAGNOSIS — B009 Herpesviral infection, unspecified: Secondary | ICD-10-CM | POA: Insufficient documentation

## 2010-03-21 ENCOUNTER — Encounter: Payer: Self-pay | Admitting: Internal Medicine

## 2010-03-30 ENCOUNTER — Ambulatory Visit: Payer: Self-pay | Admitting: Internal Medicine

## 2010-04-10 ENCOUNTER — Ambulatory Visit: Payer: Self-pay | Admitting: Internal Medicine

## 2010-04-18 ENCOUNTER — Telehealth: Payer: Self-pay | Admitting: Internal Medicine

## 2010-04-25 ENCOUNTER — Ambulatory Visit: Payer: Self-pay | Admitting: Internal Medicine

## 2010-05-01 ENCOUNTER — Ambulatory Visit
Admission: RE | Admit: 2010-05-01 | Discharge: 2010-05-01 | Payer: Self-pay | Source: Home / Self Care | Attending: Internal Medicine | Admitting: Internal Medicine

## 2010-05-01 ENCOUNTER — Encounter: Payer: Self-pay | Admitting: Internal Medicine

## 2010-05-01 ENCOUNTER — Telehealth: Payer: Self-pay | Admitting: Internal Medicine

## 2010-05-04 ENCOUNTER — Ambulatory Visit: Admit: 2010-05-04 | Payer: Self-pay

## 2010-05-04 ENCOUNTER — Ambulatory Visit
Admission: RE | Admit: 2010-05-04 | Discharge: 2010-05-04 | Payer: Self-pay | Source: Home / Self Care | Attending: Internal Medicine | Admitting: Internal Medicine

## 2010-05-07 ENCOUNTER — Telehealth: Payer: Self-pay | Admitting: Internal Medicine

## 2010-05-08 ENCOUNTER — Telehealth: Payer: Self-pay | Admitting: *Deleted

## 2010-05-08 ENCOUNTER — Encounter: Payer: Self-pay | Admitting: Internal Medicine

## 2010-05-08 ENCOUNTER — Ambulatory Visit
Admission: RE | Admit: 2010-05-08 | Discharge: 2010-05-08 | Payer: Self-pay | Source: Home / Self Care | Attending: Internal Medicine | Admitting: Internal Medicine

## 2010-05-09 ENCOUNTER — Telehealth: Payer: Self-pay | Admitting: Internal Medicine

## 2010-05-09 ENCOUNTER — Ambulatory Visit (HOSPITAL_COMMUNITY)
Admission: RE | Admit: 2010-05-09 | Discharge: 2010-05-09 | Payer: Self-pay | Source: Home / Self Care | Attending: Internal Medicine | Admitting: Internal Medicine

## 2010-05-09 ENCOUNTER — Ambulatory Visit
Admission: RE | Admit: 2010-05-09 | Discharge: 2010-05-09 | Payer: Self-pay | Source: Home / Self Care | Attending: Internal Medicine | Admitting: Internal Medicine

## 2010-05-13 ENCOUNTER — Emergency Department (HOSPITAL_COMMUNITY)
Admission: EM | Admit: 2010-05-13 | Discharge: 2010-05-13 | Payer: Self-pay | Source: Home / Self Care | Admitting: Family Medicine

## 2010-05-17 ENCOUNTER — Ambulatory Visit: Admit: 2010-05-17 | Payer: Self-pay

## 2010-05-17 ENCOUNTER — Encounter: Payer: Self-pay | Admitting: Internal Medicine

## 2010-05-25 ENCOUNTER — Ambulatory Visit
Admission: RE | Admit: 2010-05-25 | Discharge: 2010-05-25 | Payer: Self-pay | Source: Home / Self Care | Attending: Internal Medicine | Admitting: Internal Medicine

## 2010-05-27 ENCOUNTER — Ambulatory Visit: Payer: Self-pay | Admitting: Internal Medicine

## 2010-05-29 NOTE — Initial Assessments (Signed)
Summary: Hospital Admission  INTERNAL MEDICINE ADMISSION HISTORY AND PHYSICAL   PCP: Dr. Vassie Loll  Attending: Dr. Mariea Stable First Contact: Dr. Saralyn Pilar [phone]) Second Contact: Dr. Denton Meek [phone]) Weekend, After Hours: First Contact [phone]), Second Contact ([phone])  CC: Cold since last night.  HPI: Pt is a 40 year old AAF with a PMHx of asthma, copd, morbid obesity who presents to [facility] ED with 1 day history of feeling "sick". Specifically, patient indicates that last night, she developed shortness of breath which occured both at rest and with activity, wheezing - requiring increase Albuterol administation, nasal congestion with rhinnorhea, nonproductive cough, and body aches. Also confirms dull, non radiating mid-chest pain that is worse with deep inspiration with no associated palpitations, consistent with pain felt during acute exacerbations. Has had multiple prior admissions for asthma exacerbation, last admission was 11/2008, and countless ED visits for the same. Has never required intubation during these acute exacerbations. Exacerbations usually precipitated by URI. Because of her persistent asthma, patient is followed by Dr. Jetty Duhamel, pulmonology, and is on Albuterol, Atrovent, Dulera, and receiving biweekley Xolair injections. States she is requiring less frequent hospitalizations for asthma exacerbation since starting the Xolair shots.   Notably, she missed her Xolair shot last week. No sick contacts. No recent long-distance travel, no use of OCP. No fever, chills, nausea, vomiting, or diarrhea.  States had had her flu shot this season, and Pneumovax within the last 1 year.   ALLERGIES: 1) ACE INHIBITORS/ LISINOPRIL -cough  PAST MEDICAL HISTORY: 1)  Acanthosis Nigracans 2)  Asthma, Extrinsic w/ Acute Exacerbation  - Skin test 02/01/09 Pos - Has required multiple hospitalizations  (11/2008, 09/2008, 10/09, 03/09, 01/06), and countless ED visits in  between for acute asthma exacerbation 3)  Essential Hypertension, NOS 4)  Insomnia 5)  Morbid Obesity 6)  GERD  7)  Helicobacter pylori infection 8)  Sleep Apnea - Last Sleep study (03/2007) - Very mild obstructive sleep apnea/hypopnea syndrome, AHI 5.8 per  hour, RDI 7.8 per hour.  Normal range 0-5 per hour.  Most events occurred while in REM sleep.  Loud snoring with oxygen desaturation to a nadir of 89%. CPAP not recommended 9)  COPD (ICD-496) 10)  Hemorrhoids. 11) DEPRESSION (ICD-311) 12) Former tobacco abuse - quit 2 years ago, previously smoked 3-4 cig/day x15 years. 13) Former drug abuse - cocaine. UDS (+) 2006 14) History of Left knee Baker's cyst - has required multiple Ketorolac injections for symptom management  MEDICATIONS: 1) DIOVAN 160 MG TABS (VALSARTAN) 1 by mouth once daily 2) OMEPRAZOLE 40 MG CPDR (OMEPRAZOLE) 1 by mouth two times a day 3) HYDROCHLOROTHIAZIDE 25 MG TABS (HYDROCHLOROTHIAZIDE) one tablet by mouth daily 4) FLONASE 50 MCG/ACT SUSP (FLUTICASONE PROPIONATE) Take 2 sprays in each nostril daily for 3 days, then one spray in each nostil daily for 2 weeks 5) CLARITIN 10 MG TABS (LORATADINE) Take 1 tablet by mouth once a day 6) ALBUTEROL SULFATE (2.5 MG/3ML) 0.083% NEBU (ALBUTEROL SULFATE) 1 neb four times a day as needed 7) ATROVENT HFA 17 MCG/ACT AERS (IPRATROPIUM BROMIDE HFA) 2 puffs . qid as needed rescue 8) DULERA 200-5 MCG/ACT AERO (MOMETASONE FURO-FORMOTEROL FUM) Use two times a day for asthma. 9) EPIPEN 0.3 MG/0.3ML DEVI (EPINEPHRINE) Use as directed as needed severe allergic reaction 10) XOLAIR 150 MG SOLR (OMALIZUMAB) 300mg  every 2 weeks 11) AMBIEN 10 MG TABS (ZOLPIDEM TARTRATE) take 1 by mouth at bedtime as needed to fall sleep. 12) PROAIR HFA 108 (90 BASE) MCG/ACT AERS (ALBUTEROL  SULFATE) 2 puffs four times a day as needed 13) KETOROLAC TROMETHAMINE 10 MG TABS (KETOROLAC TROMETHAMINE) Take 1 tablet by mouth every 6 hours as needed for severe pain. Take  with food.  SOCIAL HISTORY: Daughter in [employer] Not married Former smoker (quit 2 years ago, previously smoked 3-4 cigs daily x 15 years.  No alcohol, No drugs Occupation: Counselling psychologist at [employer] - dust exposure on job, has tried mask but cannot tolerate  Insurance: [employer]  FAMILY HISTORY Mother - HTN Grandmother, daughter, neice- asthma Maternal Aunt died- lung cancer  ROS: per HPI  VITALS: T:  98.6 P: 91 BP: 136/79 RR: 32 O2SAT: 100%  ON: RA  PHYSICAL EXAM: General:  alert, well-developed, NAD, cooperative, No accessory muscle usage. Head:  normocephalic and atraumatic.   Eyes:  PERRLA, EOMI, vision grossly intact, conjuctive and sclerae within normal limits.   Mouth:  MMM, no erythema, no exudates, or lesions, all teeth have been removed. Oropharynx nonerythematous, no appreciable exudates. Nose: Mucous membranes inflammed with clear rhinorrhea .   Neck:  supple, full ROM, trachea midline, no palp masses, no JVD, no carotid bruits.   Lungs:  Diffuse expiratory wheezes, posterior lung sounds with inspiratory wheezes Heart: RRR, no M/R/G Abdomen:  soft, obese, NT, ND, BS present and normoactive, no palpable masses Neurologic:  CN II-XII intact,+5 strength globally, sensation grossly intact, gait normal.   Skin:  turgor normal and no rashes.   LABS:  TCO2                                     25                0-100            mmol/L  Ionized Calcium                          1.03       l      1.12-1.32        mmol/L  Hemoglobin (HGB)                         13.6              12.0-15.0        g/dL  Hematocrit (HCT)                         40.0              36.0-46.0        %  Sodium (NA)                              139               135-145          mEq/L  Potassium (K)                            3.5               3.5-5.1          mEq/L  Chloride  105               96-112           mEq/L  Glucose                                   90                70-99            mg/dL  BUN                                      7                 6-23             mg/dL  Creatinine                               0.7               0.4-1.2          mg/dL  ASSESSMENT AND PLAN: 1) Acute asthma exacerbation - likely secondary to acute URI versus missing dosage of Xolair in the setting of acute seasonal change, precipitating by allergies. Currently symptoms of nasal congestion, sore throat, nonproductive cough are most suggestive of viral infection. As well, CXR is negative for new infiltrates, therefore PNA unlikely. Also, patient afebrile. Chest pain is the same as described during prior asthma exacerbations, is without palpitations. - Will start Duonebs treatment until peak flow meter > 200-250 L/min, then q4hours x24 hours, then as needed - Given severity of wheezing, will start Solumedrol IV q6hours  - Continue Flonase for nasal congestion - Consider Xolair injection inpatient versus outpatient after discharge. - Will check CBC, BMET, now, and in AM  2) Chest pain - likely musculoskeletal 2/2 to cough and asthma exacerbation. Has former hx of cocaine abuse.  - Will check EKG in AM, check CE x 1, will check UDS  3) URI - likely viral, difficult to ascertain with such acute symptom onset. CXR negative for new infiltrates. - Continue supportive care, and monitor for now.  4)  GERD with prior HELICOBACTER PYLORI INFECTION (ICD-041.86)  - Continue on PPI  5)  SLEEP APNEA (ICD-780.57) - mild sleep apnea, not requiring CPAP at this time.  6)  COPD (ICD-496) - See  #1 above  7)  DEPRESSION (ICD-311) - Continue home meds  8)    HYPERTENSION, ESSENTIAL NOS (ICD-401.9) - controlled - Continue home meds  9)   INSOMNIA (ICD-780.52) - Continue home meds  10) VTE PROPH: Heparin   Johnette Abraham, D.O. PGY1 _____________________________________  Deatra Robinson, M.D. PGY2  _____________________________________    I  have seen and examined the patient. I reviewed the resident/fellow note and agree with the findings and plan of care as documented. My additions and revisions are included.   Signature:     _____________________________________     Internal medicine Teaching Service Attending  Date:      _____________________________________   Appended Document: Hospital Admission Prior to the patient being admitted and transferred to the regular floor, she insisted on getting discharged from the ED, stating that she was feeling much better after the Albuterol nebulizer and Magnesium Treatment. Dr. Denton Meek  evaluated the patient and discharged the patient on a Medrol-dose pack with close clinic follow-ip. She is instructed to report to the [facility] on 03/01/10 for reevaluation of her wheezing and shortness of breath.  Appended Document: Hospital Admission I spoke with Shon Hough on 03/01/10 AM regarding the [facility] Appt (on 03/01/10) that Ms. Pescador was instructed to make on ED discharge, however, no follow-up appt had been made at that time. Chilon informed me that Ms. Mcghee does not regularly make or keep appts. Attempts were made to schedule an appt with the patient. However, I spoke with spoke with Inocencio Homes in [facility] on the afternoon of 03/01/10, and still not appts had been made for Ms. Sabado., it seems that she followed up with her pulmonologist on 11/03 for this problem, and he thought it may be a lingering asthma exacerbation, and recommended Xolair, Depo-Medrol, Avelox, and finish medrol dose pack. Also recommended neti pot, sudafed for question of sinusitis.   I will ask for Chilon to again please try to get an appt for Ms. Duchemin within a few weeks after she has tried the therapies recommend by her pulmonologist.  Johnette Abraham, D.O.

## 2010-05-29 NOTE — Letter (Signed)
Summary: [facility] [employer]  [facility] [facility] [employer]   Imported By: Sherian Rein 06/15/2009 07:53:30  _____________________________________________________________________  External Attachment:    Type:   Image     Comment:   External Document

## 2010-05-29 NOTE — Assessment & Plan Note (Signed)
Summary: [facility]  Nurse Visit   Allergies: 1)  ! * Ace Inhibitors/ Lisinopril  Medication Administration  Injection # 1:    Medication: Xolair (omalizumab) 150mg     Diagnosis: EXTRINSIC ASTHMA, UNSPECIFIED (ICD-493.00)    Route: SQ    Site: R deltoid    Exp Date: 04/2013    Lot #: [id number]    Mfr: Genetech    Comments: 1 in left and right arm 225 mg charged [phone]    Patient tolerated injection without complications    Given by: susanne ford in allergy lab  Orders Added: 1)  Administration xolair injection [id number]   Medication Administration  Injection # 1:    Medication: Xolair (omalizumab) 150mg     Diagnosis: EXTRINSIC ASTHMA, UNSPECIFIED (ICD-493.00)    Route: SQ    Site: R deltoid    Exp Date: 04/2013    Lot #: [id number]    Mfr: Genetech    Comments: 1 in left and right arm 225 mg charged [id number]    Patient tolerated injection without complications    Given by: susanne ford in allergy lab  Orders Added: 1)  Administration xolair injection [phone]

## 2010-05-29 NOTE — Letter (Signed)
Summary: [employer]  [name]   [place], [place] [address]   Phone: [phone]  Fax: [phone]    Today's Date: November 07, 2009  Name of Patient: April [name]  The above named patient had a medical visit today at: 11:30 am.  Please take this into consideration when reviewing the time away from work.    Special Instructions:  [  ] None  [ X ] To be off the remainder of today, returning to the normal work schedule tomorrow.  [  ] To be off until the next scheduled appointment on ______________________.  [  ] Other ________________________________________________________________ ________________________________________________________________________   Sincerely yours,       Sanel Stemmer, N.P.

## 2010-05-29 NOTE — Progress Notes (Signed)
Summary: sample of dulera  Phone Note Call from Patient Call back at Home Phone [phone]   Caller: Patient Call For: young Summary of Call: pt requests samples of dulera and proair Initial call taken by: Tivis Ringer, CNA,  February 14, 2010 12:26 PM  Follow-up for Phone Call        1 sample of dulera left at front desk for pt to pick up.  no samples avail of proair.  LMOM informing pt sample at front desk to pick up.  Dulera 200/5  lot # [id number]   Exp Aug 2012.  Arman Filter LPN  February 14, 2010 2:54 PM

## 2010-05-29 NOTE — Assessment & Plan Note (Signed)
Summary: xolair///kp  Nurse Visit   Allergies: 1)  ! * Ace Inhibitors/ Lisinopril  Medication Administration  Injection # 1:    Medication: Xolair (omalizumab) 150mg     Diagnosis: EXTRINSIC ASTHMA, UNSPECIFIED (ICD-493.00)    Route: SQ    Site: L deltoid    Exp Date: 04/2013    Lot #: [id number]    Mfr: Genetech    Comments: 0.9 ML IN LEFT AND RIGHT ARM 225MG  CHARGED 96401 PT DIDNT WAIT    Given by: Kandice Hams IN ALLERGY LAB  Orders Added: 1)  Administration xolair injection [id number]   Medication Administration  Injection # 1:    Medication: Xolair (omalizumab) 150mg     Diagnosis: EXTRINSIC ASTHMA, UNSPECIFIED (ICD-493.00)    Route: SQ    Site: L deltoid    Exp Date: 04/2013    Lot #: [id number]    Mfr: Salome Spotted    Comments: 0.9 ML IN LEFT AND RIGHT ARM 225MG  CHARGED 96401 PT DIDNT WAIT    Given by: Kandice Hams IN ALLERGY LAB  Orders Added: 1)  Administration xolair injection [phone]

## 2010-05-29 NOTE — Progress Notes (Signed)
Summary: refill/gg  Phone Note Refill Request  on December 21, 2009 4:53 PM  Refills Requested: Medication #1:  AMBIEN 10 MG TABS take 1 by mouth at bedtime   Last Refilled: 11/26/2009  Method Requested: Fax to Local Pharmacy Initial call taken by: Merrie Roof RN,  December 21, 2009 4:53 PM  Follow-up for Phone Call        Refill approved-nurse to complete    New/Updated Medications: AMBIEN 10 MG TABS (ZOLPIDEM TARTRATE) take 1 by mouth at bedtime as needed to fall sleep. Prescriptions: AMBIEN 10 MG TABS (ZOLPIDEM TARTRATE) take 1 by mouth at bedtime as needed to fall sleep.  #31 x 1   Entered and Authorized by:   Vassie Loll MD   Signed by:   Vassie Loll MD on 12/22/2009   Method used:   Telephoned to ...       [facility] [address] [phone]* (retail)       [address]       [place], [place]  [address]       Ph: [phone]       Fax: [phone]   RxID:   [id number]   Appended Document: refill/gg Rx called in

## 2010-05-29 NOTE — Assessment & Plan Note (Signed)
Summary: ASTHMA/XOLAIR INJECTION/[facility]  Nurse Visit   Allergies: No Known Drug Allergies  Medication Administration  Injection # 1:    Medication: Xolair (omalizumab) 150mg     Diagnosis: EXTRINSIC ASTHMA, UNSPECIFIED (ICD-493.00)    Route: SQ    Site: L deltoid    Exp Date: 08/27/2012    Lot #: [id number]    Mfr: GENENTECH    Comments: 0.9 ML IN LEFT AND RIGHT ARM PT DIDNT WAIT    Given by: TAMMY SCOTT IN ALLERGY LAB  Orders Added: 1)  Administration xolair injection [id number]   Medication Administration  Injection # 1:    Medication: Xolair (omalizumab) 150mg     Diagnosis: EXTRINSIC ASTHMA, UNSPECIFIED (ICD-493.00)    Route: SQ    Site: L deltoid    Exp Date: 08/27/2012    Lot #: [id number]    Mfr: GENENTECH    Comments: 0.9 ML IN LEFT AND RIGHT ARM PT DIDNT WAIT    Given by: TAMMY SCOTT IN ALLERGY LAB  Orders Added: 1)  Administration xolair injection [id number]

## 2010-05-29 NOTE — Assessment & Plan Note (Signed)
Summary: rov/mhh   Copy to:  [facility] Primary Provider/Referring Provider:  Valetta Close MD  CC:  follow up visit-asthma; breathing good.[name]  History of Present Illness: August 11, 2009- Asthma, rhinitis She has not had her sleep study. She feels she can't leave her teenage daughters at home, so this will not be done for now. Sleeping some better. She wakes some. Not snoring as badly now. Asthma is doing much better with Xolair. She is vey pleased that she hasn't had to go to the hospital at all. Her meds control mild morning wheeze. We discussed Xolair again.  November 07, 2009--Presents for an acute office visit. Complains of increased SOB, wheezing, dry cough, tightness/scratchiness in throat x3days. Lots of nasal congestion, stuffiness, post nasal drip. Complains  of recurrent dry cough, tickle in throat, wheezing w/ tightness. Worse for last 3 days. Was seen in ER couple of months ago w/ similar symptoms. CXR in March of this year was w/ no acute findings. Denies chest pain,  orthopnea, hemoptysis, fever, n/v/d, edema, headache.   November 24, 2009- Asthma, rhinitis She was seen here for acute flare of asthma, given prednisone taper and switched from Lisinopril to Diovan last visit.  She said at first that she felt  better in her throat with this change. She is taking Herbalife products trying to lose weight. She also feels Xolair continues to "do great" . Today her breathing is good. She says in the current hot, "orange" air qulaity she is using her rescue inhaler more than once daily. 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 As we talk, she begins to complain that throat is itching and demonstrates dry cough.   Asthma History    Initial Asthma Severity Rating:    Age range: 12+ years    Symptoms: 0-2 days/week    Nighttime Awakenings: 0-2/month    Interferes w/ normal activity: no limitations    SABA use (not for EIB): several times per day    Asthma Severity  Assessment: Severe Persistent   Preventive Screening-Counseling & Management  Alcohol-Tobacco     Smoking Status: quit     Year Quit: 2008     Passive Smoke Exposure: no  Current Medications (verified): 1)  Diovan 160 Mg Tabs (Valsartan) .[name].. 1 By Mouth Once Daily 2)  Omeprazole 40 Mg Cpdr (Omeprazole) .[name].. 1 By Mouth Two Times A Day 3)  Hydrochlorothiazide 25 Mg Tabs (Hydrochlorothiazide) .... One Tablet By Mouth Daily 4)  Flonase 50 Mcg/act Susp (Fluticasone Propionate) .... Take 2 Sprays in Each Nostril Daily For 3 Days, Then One Spray in Each Nostil Daily For 2 Weeks 5)  Claritin 10 Mg Tabs (Loratadine) .... Take 1 Tablet By Mouth Once A Day 6)  Albuterol Sulfate (2.5 Mg/82ml) 0.083% Nebu (Albuterol Sulfate) .[name].. 1 Neb Four Times A Day As Needed 7)  Atrovent Hfa 17 Mcg/act Aers (Ipratropium Bromide Hfa) .... 2 Puffs . Qid As Needed Rescue 8)  Dulera 200-5 Mcg/act Aero (Mometasone Furo-Formoterol Fum) .... Use Two Times A Day For Asthma. 9)  Epipen 0.3 Mg/0.32ml Devi (Epinephrine) .... Use As Directed As Needed Severe Allergic Reaction 10)  Xolair 150 Mg Solr (Omalizumab) .... 300mg  Every 2 Weeks 11)  Ambien 10 Mg Tabs (Zolpidem Tartrate) .... Take 1 By Mouth At Bedtime 12)  Proair Hfa 108 (90 Base) Mcg/act Aers (Albuterol Sulfate) .... 2 Puffs Four Times A Day As Needed  Allergies (verified): 1)  ! * Ace Inhibitors/ Lisinopril  Past History:  Past Medical History: Last updated: 02/01/2009 ACANTHOSIS NIGRICANS (ICD-701.2) FOOT PAIN, RIGHT (ICD-729.5) MENORRHAGIA (ICD-626.2) ASTHMA, EXTRINSIC W/ACUTE EXACERBATION (ICD-493.02) HYPERTENSION, ESSENTIAL NOS (ICD-401.9) DENTAL PAIN (ICD-525.9) INSOMNIA (ICD-780.52) OBESITY, MORBID (ICD-278.01) ABDOMINAL PAIN (ICD-789.00) HELICOBACTER PYLORI INFECTION (ICD-041.86) SLEEP APNEA (ICD-780.57) COPD (ICD-496) ASTHMA (ICD-493.90)- Skin test 02/01/09 Pos Hemorrhoids. DEPRESSION (ICD-311) GERD (ICD-530.81) WRIST PAIN, RIGHT  (ICD-719.43) TOBACCO USER (ICD-305.1) TUBAL LIGATION, HX OF (ICD-V26.51)  Past Surgical History: Last updated: 04/10/2006 Tubal ligation, bilateral, 1996  Family History: Last updated: 12/16/2008 daughter, grandmother, neice- asthma Aunt died- lung cancer  Social History: Last updated: 12/16/2008 Daughter in [employer] Not married Former smoker. 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  Risk Factors: Exercise: yes (10/06/2009)  Risk Factors: Smoking Status: quit (11/24/2009) Passive Smoke Exposure: no (11/24/2009)  Review of Systems      See HPI       The patient complains of shortness of breath with activity.  The patient denies shortness of breath at rest, productive cough, non-productive cough, coughing up blood, chest pain, irregular heartbeats, acid heartburn, indigestion, loss of appetite, weight change, abdominal pain, difficulty swallowing, sore throat, tooth/dental problems, headaches, nasal congestion/difficulty breathing through nose, and sneezing.    Vital Signs:  Patient profile:   40 year old female Height:      65 inches Weight:      336.38 pounds BMI:     56.18 O2 Sat:      97 % on Room air Pulse rate:   92 / minute BP sitting:   140 / 78  (left arm) Cuff size:   large  Vitals Entered By: Reynaldo Minium CMA (November 24, 2009 2:49 PM)  O2 Flow:  Room air CC: follow up visit-asthma; breathing good.   Physical Exam  Additional Exam:  General: A/Ox3; pleasant and cooperative, NAD, very obese, resting room air sat 97% SKIN: no rash, lesions.  NODES: no lymphadenopathy HEENT: [place]/AT, EOM- WNL , TM-WNL, Nose- wet sniffing, Throat- clear and wnl, , Mallampati IV, multi dental caries, no drainage NECK: Supple w/ fair ROM, JVD- none, normal carotid impulses w/o bruits Thyroid-nonpalpable  CHEST: clear with no wheeze, no stridor noted  HEART: RRR, no  m/g/r heard ABDOMEN: Soft and nl; ZOX:WRUE, nl pulses, no edema  NEURO: Grossly intact to observation      Impression & Recommendations:  Problem # 1:  EXTRINSIC ASTHMA, UNSPECIFIED (ICD-493.00) We will continue with Xolair and her maintnenance meds. She has a peakflow meter and I asked her to get it out and use it to see if the muggy weather is just making her feel smothered or she is really having some bronchospasm at times.  I am giving refill on Diovan for sustained use. She needs to avoid ACE inhibitors, but it isn't clear to me that symptoms have really been changed by switch from lisinopril.  Problem # 2:  RHINITIS (ICD-472.0)  Complaint now of throat itching and dry cough. She is given Astepro nasal spray sample for trial, with consideration as above.  Other Orders: Est. Patient Level III (45409)  Patient Instructions: 1)  Please schedule a follow-up appointment in 3 months. 2)  Script to continue Diovan for blood pressure instead of lisinopril 3)  Sample nasal antihistamine spray: 1-2 sprays each nostril up to twice daily if needed for cough and throat itching. Prescriptions: DIOVAN 160 MG TABS (VALSARTAN) 1 by mouth once daily  #  30 x prn   Entered and Authorized by:   [name] MD   Signed by:   [name] MD on 11/24/2009   Method used:   Print then Give to Patient   RxID:   [id number]     Appended Document: Orders Update    Clinical Lists Changes        Medication Administration  Injection # 1:    Medication: Xolair (omalizumab) 150mg     Diagnosis: 493.00    Route: SQ    Site: L deltoid    Exp Date: 11/2012    Lot #: [id number]    Mfr: Salome Spotted    Comments: 0.9 ml in right and left arm charged 96401    Given by: tammy scott in allergy lab

## 2010-05-29 NOTE — Assessment & Plan Note (Signed)
Summary: xolair/ mbw  Nurse Visit   Allergies: 1)  ! * Ace Inhibitors/ Lisinopril  Medication Administration  Injection # 1:    Medication: Xolair (omalizumab) 150mg     Diagnosis: EXTRINSIC ASTHMA, UNSPECIFIED (ICD-493.00)    Route: IM    Site: R deltoid    Exp Date: 01/27/2013    Lot #: [id number]    Comments: xolair 225, 60 units, 0.9 ml x Left deltoid, 0.9 ml x right deltoid. Pt didn't wait.    Given by: Dimas Millin, Allergy Tech.  Orders Added: 1)  Administration xolair injection [phone]

## 2010-05-29 NOTE — Assessment & Plan Note (Signed)
Summary: EST/ROUTINE CHECKUP/CH   Vital Signs:  Patient profile:   40 year old female Height:      65 inches (165.10 cm) Weight:      330.2 pounds (150.09 kg) BMI:     55.15 Temp:     97.0 degrees F (36.11 degrees C) oral Pulse rate:   79 / minute BP sitting:   136 / 92  (right arm) Cuff size:   wrist  Vitals Entered By: Cynda Familia Duncan Dull) (December 27, 2009 4:13 PM) CC: pt c/o right arm pain everyday for a mth, discuss weight loss options, not sleeping well Is Patient Diabetic? No Pain Assessment Patient in pain? yes     Location: right arm Intensity: 8 Type: throbbing Onset of pain  Constant everyday x 1 mth Nutritional Status BMI of > 30 = obese  Have you ever been in a relationship where you felt threatened, hurt or afraid?No   Does patient need assistance? Functional Status Self care Ambulation Normal   Primary Care Provider:  Valetta Close MD  CC:  pt c/o right arm pain everyday for a mth, discuss weight loss options, and not sleeping well.  History of Present Illness: 40 y/o female patient with pmh as described in the EMR; who comes to the clinic today in order to discuss FMLA paperwork; recent treatment/further assessment for her right arm pain and obesity.  Patient recently evaluated for right arm pain (especially localized in her shoulder joint/ trapezius muscle); which has been present on/off for one month and she feels like it is getting worse. No trauma.  Can hurt with activity and with rest.    She received prescription for ketoralac and also a referral for PT (which would start next week). She currently reports some improvement in the pain, but endorses that she is using 20mg  of ketoralac at a time instead of 10. She deneis stomack discomfort or any other side effects associated with new medication regimen.  She is interested in start a weight loss program/ weight lost pills; but she doesn't like the idea of belly discomfort and diarrhea associated with  current pills for weight control.  Asthma is well controlled and her FMLA questions/doubts were answered during this visit.  Depression History:      The patient denies a depressed mood most of the day and a diminished interest in her usual daily activities.        The patient denies that she feels like life is not worth living, denies that she wishes that she were dead, and denies that she has thought about ending her life.         Preventive Screening-Counseling & Management  Alcohol-Tobacco     Smoking Status: quit     Year Quit: 2008     Passive Smoke Exposure: no  Problems Prior to Update: 1)  Arm Pain, Right  (ICD-729.5) 2)  Extrinsic Asthma, Unspecified  (ICD-493.00) 3)  Routine Gynecological Examination  (ICD-V72.31) 4)  Arm Pain, Left  (ICD-729.5) 5)  Chest Pain  (ICD-786.50) 6)  Gastroesophageal Reflux Disease, Chronic  (ICD-530.81) 7)  Knee Pain, Left  (ICD-719.46) 8)  Baker's Cyst, Left Knee  (ICD-727.51) 9)  Back Pain, Chronic, Intermittent  (ICD-724.5) 10)  Dental Pain  (ICD-525.9) 11)  Leg Edema, Left  (ICD-782.3) 12)  Hemorrhoids  (ICD-455.6) 13)  Diarrhea  (ICD-787.91) 14)  Headache  (ICD-784.0) 15)  Acanthosis Nigricans  (ICD-701.2) 16)  Foot Pain, Right  (ICD-729.5) 17)  Menorrhagia  (ICD-626.2) 18)  Asthma,  Extrinsic W/acute Exacerbation  (ICD-493.02) 19)  Rhinitis  (ICD-472.0) 20)  Hypertension, Essential Nos  (ICD-401.9) 21)  Dental Pain  (ICD-525.9) 22)  Insomnia  (ICD-780.52) 23)  Obesity, Morbid  (ICD-278.01) 24)  Abdominal Pain  (ICD-789.00) 25)  Helicobacter Pylori Infection  (ICD-041.86) 26)  Sleep Apnea  (ICD-780.57) 27)  COPD  (ICD-496) 28)  Asthma  (ICD-493.90) 29)  Depression  (ICD-311) 30)  Gerd  (ICD-530.81) 31)  Wrist Pain, Right  (ICD-719.43) 32)  Tobacco User  (ICD-305.1) 33)  Tubal Ligation, Hx of  (ICD-V26.51)  Current Problems (verified): 1)  Arm Pain, Right  (ICD-729.5) 2)  Extrinsic Asthma, Unspecified  (ICD-493.00) 3)   Routine Gynecological Examination  (ICD-V72.31) 4)  Arm Pain, Left  (ICD-729.5) 5)  Chest Pain  (ICD-786.50) 6)  Gastroesophageal Reflux Disease, Chronic  (ICD-530.81) 7)  Knee Pain, Left  (ICD-719.46) 8)  Baker's Cyst, Left Knee  (ICD-727.51) 9)  Back Pain, Chronic, Intermittent  (ICD-724.5) 10)  Dental Pain  (ICD-525.9) 11)  Leg Edema, Left  (ICD-782.3) 12)  Hemorrhoids  (ICD-455.6) 13)  Diarrhea  (ICD-787.91) 14)  Headache  (ICD-784.0) 15)  Acanthosis Nigricans  (ICD-701.2) 16)  Foot Pain, Right  (ICD-729.5) 17)  Menorrhagia  (ICD-626.2) 18)  Asthma, Extrinsic W/acute Exacerbation  (ICD-493.02) 19)  Rhinitis  (ICD-472.0) 20)  Hypertension, Essential Nos  (ICD-401.9) 21)  Dental Pain  (ICD-525.9) 22)  Insomnia  (ICD-780.52) 23)  Obesity, Morbid  (ICD-278.01) 24)  Abdominal Pain  (ICD-789.00) 25)  Helicobacter Pylori Infection  (ICD-041.86) 26)  Sleep Apnea  (ICD-780.57) 27)  COPD  (ICD-496) 28)  Asthma  (ICD-493.90) 29)  Depression  (ICD-311) 30)  Gerd  (ICD-530.81) 31)  Wrist Pain, Right  (ICD-719.43) 32)  Tobacco User  (ICD-305.1) 33)  Tubal Ligation, Hx of  (ICD-V26.51)  Medications Prior to Update: 1)  Diovan 160 Mg Tabs (Valsartan) .[name].. 1 By Mouth Once Daily 2)  Omeprazole 40 Mg Cpdr (Omeprazole) .[name].. 1 By Mouth Two Times A Day 3)  Hydrochlorothiazide 25 Mg Tabs (Hydrochlorothiazide) .... One Tablet By Mouth Daily 4)  Flonase 50 Mcg/act Susp (Fluticasone Propionate) .... Take 2 Sprays in Each Nostril Daily For 3 Days, Then One Spray in Each Nostil Daily For 2 Weeks 5)  Claritin 10 Mg Tabs (Loratadine) .... Take 1 Tablet By Mouth Once A Day 6)  Albuterol Sulfate (2.5 Mg/74ml) 0.083% Nebu (Albuterol Sulfate) .[name].. 1 Neb Four Times A Day As Needed 7)  Atrovent Hfa 17 Mcg/act Aers (Ipratropium Bromide Hfa) .... 2 Puffs . Qid As Needed Rescue 8)  Dulera 200-5 Mcg/act Aero (Mometasone Furo-Formoterol Fum) .... Use Two Times A Day For Asthma. 9)  Epipen 0.3 Mg/0.54ml Devi  (Epinephrine) .... Use As Directed As Needed Severe Allergic Reaction 10)  Xolair 150 Mg Solr (Omalizumab) .... 300mg  Every 2 Weeks 11)  Ambien 10 Mg Tabs (Zolpidem Tartrate) .... Take 1 By Mouth At Bedtime As Needed To Fall Sleep. 12)  Proair Hfa 108 (90 Base) Mcg/act Aers (Albuterol Sulfate) .... 2 Puffs Four Times A Day As Needed 13)  Ketorolac Tromethamine 10 Mg Tabs (Ketorolac Tromethamine) .... Take 1 Tablet By Mouth Every 6 Hours As Needed For Severe Pain. Take With Food.  Current Medications (verified): 1)  Diovan 160 Mg Tabs (Valsartan) .[name].. 1 By Mouth Once Daily 2)  Omeprazole 40 Mg Cpdr (Omeprazole) .[name].. 1 By Mouth Two Times A Day 3)  Hydrochlorothiazide 25 Mg Tabs (Hydrochlorothiazide) .... One Tablet By Mouth Daily 4)  Flonase 50 Mcg/act Susp (Fluticasone Propionate) .Marland KitchenMarland KitchenMarland Kitchen  Take 2 Sprays in Each Nostril Daily For 3 Days, Then One Spray in Each Nostil Daily For 2 Weeks 5)  Claritin 10 Mg Tabs (Loratadine) .... Take 1 Tablet By Mouth Once A Day 6)  Albuterol Sulfate (2.5 Mg/52ml) 0.083% Nebu (Albuterol Sulfate) .[name].. 1 Neb Four Times A Day As Needed 7)  Atrovent Hfa 17 Mcg/act Aers (Ipratropium Bromide Hfa) .... 2 Puffs . Qid As Needed Rescue 8)  Dulera 200-5 Mcg/act Aero (Mometasone Furo-Formoterol Fum) .... Use Two Times A Day For Asthma. 9)  Epipen 0.3 Mg/0.2ml Devi (Epinephrine) .... Use As Directed As Needed Severe Allergic Reaction 10)  Xolair 150 Mg Solr (Omalizumab) .... 300mg  Every 2 Weeks 11)  Ambien 10 Mg Tabs (Zolpidem Tartrate) .... Take 1 By Mouth At Bedtime As Needed To Fall Sleep. 12)  Proair Hfa 108 (90 Base) Mcg/act Aers (Albuterol Sulfate) .... 2 Puffs Four Times A Day As Needed 13)  Ketorolac Tromethamine 10 Mg Tabs (Ketorolac Tromethamine) .... Take 1 Tablet By Mouth Every 6 Hours As Needed For Severe Pain. Take With Food.  Allergies: 1)  ! * Ace Inhibitors/ Lisinopril  Past History:  Past Medical History: Last updated: 02/01/2009 ACANTHOSIS NIGRICANS  (ICD-701.2) FOOT PAIN, RIGHT (ICD-729.5) MENORRHAGIA (ICD-626.2) ASTHMA, EXTRINSIC W/ACUTE EXACERBATION (ICD-493.02) HYPERTENSION, ESSENTIAL NOS (ICD-401.9) DENTAL PAIN (ICD-525.9) INSOMNIA (ICD-780.52) OBESITY, MORBID (ICD-278.01) ABDOMINAL PAIN (ICD-789.00) HELICOBACTER PYLORI INFECTION (ICD-041.86) SLEEP APNEA (ICD-780.57) COPD (ICD-496) ASTHMA (ICD-493.90)- Skin test 02/01/09 Pos Hemorrhoids. DEPRESSION (ICD-311) GERD (ICD-530.81) WRIST PAIN, RIGHT (ICD-719.43) TOBACCO USER (ICD-305.1) TUBAL LIGATION, HX OF (ICD-V26.51)  Past Surgical History: Last updated: 04/10/2006 Tubal ligation, bilateral, 1996  Family History: Last updated: 12/16/2008 daughter, grandmother, neice- asthma Aunt died- lung cancer  Social History: Last updated: 12/16/2008 Daughter in [employer] Not married Former smoker. 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  Risk Factors: Exercise: yes (12/22/2009)  Risk Factors: Smoking Status: quit (12/27/2009) Passive Smoke Exposure: no (12/27/2009)  Review of Systems       As per HPI.  Physical Exam  General:  obese woman in NAD; alert and cooperative to examination.   Lungs:  normal respiratory effort, normal breath sounds, no crackles, and no wheezes.   Heart:  normal rate, regular rhythm, and no murmur.   Abdomen:  soft, non-tender, and normal bowel sounds.   Msk:  full ROM both shoulders with rotation behind head and behind lower back. some pain with active flexion and extension of right arm. Mild tenderness to palpation of biceps, triceps, deltoid and trapezius on the right side and muscle tension was appreciated.   Neurologic:  CN grossly intact.  sensation intact to light touch all 4 extremities.  sensation intact to light touch, sensation intact to pinprick, and gait normal.     Impression & Recommendations:  Problem # 1:  ARM PAIN,  RIGHT (ICD-729.5) Patient has been advised to follow recommendations, PT and medication as prescribed during previous visit. There has not been enough time to passed to evaluate response to that therapy and determined if it failed or not. Will refill her ketoralac since she is using 20mg  at that time instead of 10mg  (most likely due to her size, 20 would be an appropritae dose). Will also prescribed flexeril for her to use at bedtime; especially after muscle tension appreciated during physical exam today. Will follow her symptoms over the next 10-15 dyas and if she doesn't improved  will then performed images studies and will make referral to sports medicine for shoulder injection.  Problem # 2:  EXTRINSIC ASTHMA, UNSPECIFIED (ICD-493.00) Stable and well controlled. She will continue current regimen and laso followup with pulmonologist (Dr. Maple Hudson). Her updated medication list for this problem includes:    Albuterol Sulfate (2.5 Mg/16ml) 0.083% Nebu (Albuterol sulfate) .[name]... 1 neb four times a day as needed    Atrovent Hfa 17 Mcg/act Aers (Ipratropium bromide hfa) .[name]... 2 puffs . qid as needed rescue    Dulera 200-5 Mcg/act Aero (Mometasone furo-formoterol fum) ..... Use two times a day for asthma.    Xolair 150 Mg Solr (Omalizumab) ..... 300mg  every 2 weeks    Proair Hfa 108 (90 Base) Mcg/act Aers (Albuterol sulfate) .[name]... 2 puffs four times a day as needed  Problem # 3:  GASTROESOPHAGEAL REFLUX DISEASE, CHRONIC (ICD-530.81) Patient will continue her PPI two times a day and will also follow lifestyle modification instructions.  Her updated medication list for this problem includes:    Omeprazole 40 Mg Cpdr (Omeprazole) .[name]... 1 by mouth two times a day  Problem # 4:  HYPERTENSION, ESSENTIAL NOS (ICD-401.9) Well controlled and at goal. Tolerating her Diovan and HCTZ well. Patient has been advised to follow a low sodium diet and also to start exercising and following a low calorie diet in order to lose  wieght.  Her updated medication list for this problem includes:    Diovan 160 Mg Tabs (Valsartan) .[name]... 1 by mouth once daily    Hydrochlorothiazide 25 Mg Tabs (Hydrochlorothiazide) ..... One tablet by mouth daily  Problem # 5:  OBESITY, MORBID (ICD-278.01) Patient is contemplating start weight watchers program. She decide not to use xenical or alli currently and will follow a low calorie diet and will start an exercise program.  Complete Medication List: 1)  Diovan 160 Mg Tabs (Valsartan) .[name].. 1 by mouth once daily 2)  Omeprazole 40 Mg Cpdr (Omeprazole) .[name].. 1 by mouth two times a day 3)  Hydrochlorothiazide 25 Mg Tabs (Hydrochlorothiazide) .... One tablet by mouth daily 4)  Flonase 50 Mcg/act Susp (Fluticasone propionate) .... Take 2 sprays in each nostril daily for 3 days, then one spray in each nostil daily for 2 weeks 5)  Claritin 10 Mg Tabs (Loratadine) .... Take 1 tablet by mouth once a day 6)  Albuterol Sulfate (2.5 Mg/79ml) 0.083% Nebu (Albuterol sulfate) .[name].. 1 neb four times a day as needed 7)  Atrovent Hfa 17 Mcg/act Aers (Ipratropium bromide hfa) .... 2 puffs . qid as needed rescue 8)  Dulera 200-5 Mcg/act Aero (Mometasone furo-formoterol fum) .... Use two times a day for asthma. 9)  Epipen 0.3 Mg/0.52ml Devi (Epinephrine) .... Use as directed as needed severe allergic reaction 10)  Xolair 150 Mg Solr (Omalizumab) .... 300mg  every 2 weeks 11)  Ambien 10 Mg Tabs (Zolpidem tartrate) .... Take 1 by mouth at bedtime as needed to fall sleep. 12)  Proair Hfa 108 (90 Base) Mcg/act Aers (Albuterol sulfate) .... 2 puffs four times a day as needed 13)  Ketorolac Tromethamine 10 Mg Tabs (Ketorolac tromethamine) .... Take 1 tablet by mouth every 6 hours as needed for severe pain. take with food. 14)  Flexeril 10 Mg Tabs (Cyclobenzaprine hcl) .... Take 1 tab by mouth at bedtime  Other Orders: Influenza Vaccine MCR ([address])  Patient Instructions: 1)  Takemedications as prescribed. 2)   Please schedule a follow-up appointment in 2 months. 3)  Follow with your PT appoinment and continue  taking your ketoralac (now 2 tabs by mouth at time every 6hrs as needed for pain). 4)  Keep yourself active, but avoid painful activities. 5)  Apply cold compresses in your right shoulder at least 2 times a day (leave it in place for 5 minutes). Prescriptions: FLEXERIL 10 MG TABS (CYCLOBENZAPRINE HCL) Take 1 tab by mouth at bedtime  #20 x 0   Entered and Authorized by:   Vassie Loll MD   Signed by:   Vassie Loll MD on 12/27/2009   Method used:   Electronically to        [facility] [phone]* (retail)       [address]       [place], [place]  [address]       Ph: [phone]       Fax: [phone]   RxID:   [id number] KETOROLAC TROMETHAMINE 10 MG TABS (KETOROLAC TROMETHAMINE) Take 1 tablet by mouth every 6 hours as needed for severe pain. Take with food.  #30 x 1   Entered and Authorized by:   Vassie Loll MD   Signed by:   Vassie Loll MD on 12/27/2009   Method used:   Electronically to        [facility] [phone]* (retail)       [address]       [place], [place]  [address]       Ph: [phone]       Fax: [phone]   RxID:   [id number]    Prevention & Chronic Care Immunizations   Influenza vaccine: Fluvax MCR  (12/27/2009)    Tetanus booster: Not documented    Pneumococcal vaccine: Pneumovax  (03/17/2009)  Other Screening   Pap smear: Not documented   Pap smear action/deferral: Deferred  (04/17/2009)   Smoking status: quit  (12/27/2009)  Lipids   Total Cholesterol: 183  (10/06/2009)   Lipid panel action/deferral: Lipid Panel ordered   LDL: 123  (10/06/2009)   LDL Direct: Not documented   HDL: 47  (10/06/2009)   Triglycerides: 67  (10/06/2009)  Hypertension   Last Blood Pressure: 136 / 92  (12/27/2009)   Serum creatinine: 0.69  (10/06/2009)   Serum potassium 4.0  (10/06/2009)  Self-Management Support :   Personal Goals (by the  next clinic visit) :      Personal blood pressure goal: 140/90  (01/18/2009)   Patient will work on the following items until the next clinic visit to reach self-care goals:     Medications and monitoring: take my medicines every day  (12/27/2009)     Eating: eat foods that are low in salt, eat baked foods instead of fried foods  (12/27/2009)     Activity: take the stairs instead of the elevator, park at the far end of the parking lot  (12/22/2009)    Hypertension self-management support: Education handout  (12/22/2009)   Immunizations Administered:  Influenza Vaccine # 1:    Vaccine Type: Fluvax MCR    Site: left deltoid    Mfr: GlaxoSmithKline    Dose: 0.5 ml    Route: IM    Given by: Cynda Familia (AAMA)    Exp. Date: 10/27/2010    Lot #: [id number]    VIS given: 11/20/06 version given December 27, 2009.  Flu Vaccine Consent Questions:    Do you have a history of severe allergic reactions to this vaccine? no    Any prior history of allergic reactions to egg and/or gelatin? no  Do you have a sensitivity to the preservative Thimersol? no    Do you have a past history of Guillan-Barre Syndrome? no    Do you currently have an acute febrile illness? no    Have you ever had a severe reaction to latex? no    Vaccine information given and explained to patient? yes    Are you currently pregnant? no

## 2010-05-29 NOTE — Progress Notes (Signed)
Summary: note for work  Phone Note Call from Patient   Caller: Patient Call For: young Reason for Call: Talk to Nurse Summary of Call: pt having an asthma issue, needs to leave work and go home to gve herself her breathing treatment.  Needs a note for work though.  Will CDY write a note for her? Initial call taken by: Eugene Gavia,  May 03, 2009 2:08 PM  Follow-up for Phone Call        please advise.Carron Curie CMA  May 03, 2009 3:03 PM   Additional Follow-up for Phone Call Additional follow up Details #1::        Per CDY- ok to write note for pt to leave work but please inform pt that she and CDY spoke about taking neb machine to work for cases like this one(if she needs a note to take machine to work we can write that as well).Reynaldo Minium CMA  May 03, 2009 3:07 PM   pt states there is nowhere to plug the [facility] in at work, that is why she doesnt take it with her. She has appt with CY on 05-19-09 and staes she will discuss options with him then. Pt will pick up note tomorrow. Letter printed and palced at front. Carron Curie CMA  May 03, 2009 3:17 PM

## 2010-05-29 NOTE — Letter (Signed)
Summary: [employer]  [name]   [place], [place] [address]   Phone: [phone]  Fax: [phone]    Today's Date: February 02, 2010  Name of Patient: April [name]  The above named patient is able to return to work Monday October 1,2011.    Special Instructions:  Arly.Keller  ] None  [  ] To be off the remainder of today, returning to the normal work / school schedule tomorrow.  [  ] To be off until the next scheduled appointment on ______________________.  [  ] Other ________________________________________________________________ ________________________________________________________________________  If any questions or concerns please call our office at [phone].    Sincerely yours,    Jason Coop

## 2010-05-29 NOTE — Assessment & Plan Note (Signed)
Summary: xolair/ mbw  Nurse Visit   Allergies: No Known Drug Allergies  Medication Administration  Injection # 1:    Medication: Xolair (omalizumab) 150mg     Diagnosis: EXTRINSIC ASTHMA, UNSPECIFIED (ICD-493.00)    Route: SQ    Site: R deltoid    Exp Date: 08/27/2012    Lot #: [id number]    Mfr: GENENTECH    Comments: 0.9 ML X RIGHT ARM AND 0.9 ML IN LEFT ARM PT WAITED 20 MINS    Patient tolerated injection without complications    Given by: SUSANNE FORD IN [facility]  Orders Added: 1)  Administration xolair injection [id number]   Medication Administration  Injection # 1:    Medication: Xolair (omalizumab) 150mg     Diagnosis: EXTRINSIC ASTHMA, UNSPECIFIED (ICD-493.00)    Route: SQ    Site: R deltoid    Exp Date: 08/27/2012    Lot #: [id number]    Mfr: GENENTECH    Comments: 0.9 ML X RIGHT ARM AND 0.9 ML IN LEFT ARM PT WAITED 20 MINS    Patient tolerated injection without complications    Given by: SUSANNE FORD IN [facility]  Orders Added: 1)  Administration xolair injection [phone]

## 2010-05-29 NOTE — Progress Notes (Signed)
Summary: samples  Phone Note Call from Patient   Caller: Patient Call For: young Summary of Call: need sample of albuterol or proair Initial call taken by: Rickard Patience,  December 13, 2009 10:18 AM  Follow-up for Phone Call        Per [name], ok to give pt a sample of proair.   1 sample of proair placed at front desk for pt to pick up--she is aware of this.  Gweneth Dimitri RN  December 13, 2009 10:59 AM

## 2010-05-29 NOTE — Progress Notes (Signed)
Summary: Pt calling from hospital ? d/c-- ATC NA  Phone Note Call from Patient   Caller: Patient Call For: [name] Summary of Call: PT IS IN THE HOSPITAL FOR ASTHMA. SHE FEELS LIKE SHE IS OKAY NOW AND IS READY TO GO HOME. ROOM 3034 Initial call taken by: Rickard Patience,  March 05, 2010 11:44 AM  Follow-up for Phone Call        ? Which hospital is she at?  I called [phone] and NA- no option to leave a msg Vernie Murders  March 05, 2010 12:14 PM   Additional Follow-up for Phone Call Additional follow up Details #1::        Patient was discharged on 03/05/10 follow up with outpatient clinic 03/14/10 Renold Genta RCP, LPN  March 05, 2010 1:53 PM

## 2010-05-29 NOTE — Assessment & Plan Note (Signed)
Summary: xolair/mhh  Nurse Visit   Vitals Entered By: Clarise Cruz Duncan Dull) (December 22, 2009 3:34 PM)  Allergies: 1)  ! * Ace Inhibitors/ Lisinopril  Medication Administration  Injection # 1:    Medication: Xolair (omalizumab) 150mg     Diagnosis: EXTRINSIC ASTHMA, UNSPECIFIED (ICD-493.00)    Route: IM    Site: R deltoid    Exp Date: 01/27/2013    Lot #: [id number]    Mfr: Salome Spotted    Comments: xolair 0.9 ml x 1 in right deltoid, and 0.9 ml in left deltoid. Pt waited 20 mins    Given by: Drucie Opitz, CMA (AAMA)  Orders Added: 1)  Administration xolair injection [phone]   Medication Administration  Injection # 1:    Medication: Xolair (omalizumab) 150mg     Diagnosis: EXTRINSIC ASTHMA, UNSPECIFIED (ICD-493.00)    Route: IM    Site: R deltoid    Exp Date: 01/27/2013    Lot #: [id number]    Mfr: Salome Spotted    Comments: xolair 0.9 ml x 1 in right deltoid, and 0.9 ml in left deltoid. Pt waited 20 mins    Given by: Drucie Opitz, CMA (AAMA)  Orders Added: 1)  Administration xolair injection [phone]

## 2010-05-29 NOTE — Assessment & Plan Note (Signed)
Summary: SOB/gg   Vital Signs:  Patient profile:   40 year old female Height:      66 inches (167.64 cm) Weight:      342.1 pounds (147.73 kg) BMI:     52.65 Temp:     97.2 degrees F (36.22 degrees C) oral Pulse rate:   91 / minute BP sitting:   148 / 92  (right arm) Cuff size:   large  Vitals Entered By: Theotis Barrio NT II (March 08, 2010 8:34 AM) CC: SOB - 100% O2 SAT  /  MEDICATION REFILL  PATIENT STATES THAT SHE HAS SOME WHEEZING, Is Patient Diabetic? No Pain Assessment Patient in pain? yes     Location: head Intensity:         5 Type: THROB Onset of pain  SINCE LAST SUNDAY Nutritional Status BMI of > 30 = obese  Have you ever been in a relationship where you felt threatened, hurt or afraid?No   Does patient need assistance? Functional Status Self care Ambulation Normal   Primary Care Provider:  Valetta Close MD  CC:  SOB - 100% O2 SAT  /  MEDICATION REFILL  PATIENT STATES THAT SHE HAS SOME WHEEZING and .  History of Present Illness: This is a 40 year old woman with hx of asthma, GERD, and COPD who presents for an acute visit for SOB.  Of note, pt was recently discharged from the hospital on 11/8 after having an asthma exacerbation.  Pt was discharged in improving condition on Albuterol, prednisone, Advair 500/50, Flonase and Claritin.  Pt called because she did not receive her inhalers and is requesting samples of advair and ventolin.  [name] states that she has exacerbations about once per year but has never required intubation.  She as been taking her medications as prescribed but continues to feel short of breath.  Pt states that the duoneb that she uses in the hospital helps her more than her albuterol nebs at home.  Pt continues to have mild cough productive of clear sputum but this is improving.  Pt denies any palpitations but has occasional chest pain on deep inspiration.  Pt is currently waking up nightly because of her symptoms.   The patient also  complains of a headache which she has had since her hospitalization.      In the hospital the pt was discovered to have an anemia HGB 11.1 and her K was low at 3.2.  It was recommended that we follow up on these items at todays visit.   Depression History:      The patient denies a depressed mood most of the day and a diminished interest in her usual daily activities.         Preventive Screening-Counseling & Management  Alcohol-Tobacco     Smoking Status: quit     Year Quit: 2008     Passive Smoke Exposure: no  Caffeine-Diet-Exercise     Does Patient Exercise: yes     Type of exercise: WALKING  Allergies: 1)  ! * Ace Inhibitors/ Lisinopril  Past History:  Past Medical History: Last updated: 02/01/2009 ACANTHOSIS NIGRICANS (ICD-701.2) FOOT PAIN, RIGHT (ICD-729.5) MENORRHAGIA (ICD-626.2) ASTHMA, EXTRINSIC W/ACUTE EXACERBATION (ICD-493.02) HYPERTENSION, ESSENTIAL NOS (ICD-401.9) DENTAL PAIN (ICD-525.9) INSOMNIA (ICD-780.52) OBESITY, MORBID (ICD-278.01) ABDOMINAL PAIN (ICD-789.00) HELICOBACTER PYLORI INFECTION (ICD-041.86) SLEEP APNEA (ICD-780.57) COPD (ICD-496) ASTHMA (ICD-493.90)- Skin test 02/01/09 Pos Hemorrhoids. DEPRESSION (ICD-311) GERD (ICD-530.81) WRIST PAIN, RIGHT (ICD-719.43) TOBACCO USER (ICD-305.1) TUBAL LIGATION, HX OF (ICD-V26.51)  Family History:  Reviewed history from 12/16/2008 and no changes required. daughter, grandmother, neice- asthma Aunt died- lung cancer  Social History: Reviewed history from 12/16/2008 and no changes required. Daughter in [employer] Not married Former smoker. 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  Review of Systems       The patient denies any palpitations, vision changes, or change in her BM's, fever or chills.    Physical Exam  General:  alert and well-developed.  alert and well-developed.   Head:   normocephalic and atraumatic.  normocephalic and atraumatic.   Eyes:  vision grossly intact, pupils equal, pupils round, and pupils reactive to light.  vision grossly intact, pupils equal, pupils round, and pupils reactive to light.   Nose:  no external deformity and no nasal discharge.  no external deformity and no nasal discharge.   Mouth:  pharynx pink and moist.  pharynx pink and moist.   Neck:  supple.   Lungs:  Diffuse expiratory wheezes. normal respiratory effort. No crackles.  Heart:  normal rate, regular rhythm, no murmur, no gallop, and no rub.  normal rate, regular rhythm, no murmur, no gallop, and no rub.   Abdomen:  soft, non-tender, and normal bowel sounds.  soft, non-tender, and normal bowel sounds.   Pulses:  2+ dp/pt pulses.  Extremities:  No pedal edema.  Neurologic:  cranial nerves II-XII intact.  cranial nerves II-XII intact.   Skin:  no rashes   Impression & Recommendations:  Problem # 1:  ASTHMA, EXTRINSIC W/ACUTE EXACERBATION (ICD-493.02) Pt continues to have SOB and expiratory wheezes on physical exam.  Pt is sating at 100% on room air and does not appear to have an increased work of breathing.  Pt is also scheduled to see her pulmonologist this morning.  At this point we plan to increase the length of the patients steroid taper and will treat her with 40mg  daily for 5 days, 30 mg daily for 5 days, 20 mg daily for 5 days and then 10mg  daily for 5 days.  Given that the patient states that the DUONEB treatments work better for her in the hospital, I will write for ipratropium solution that she can mix with her albuterol in her nebulizer.  The patient is to continue her other medications as prescribed and she was given a sample of Ventolin today.  We will see the patient back in one week to monitor her progress.   The patient was instructed to call us or go to the ER if her SOB gets significantly worse.  I did write the patient a note saying that she should return to work on  Monday if tolerated (which she requested) but should call the clinic if she remains too symptomatic for further instructions.  Her updated medication list for this problem includes:    Albuterol Sulfate (2.5 Mg/13ml) 0.083% Nebu (Albuterol sulfate) .[name]... 1 neb four times a day as needed    Atrovent Hfa 17 Mcg/act Aers (Ipratropium bromide hfa) .[name]... 2 puffs . qid as needed rescue    Dulera 200-5 Mcg/act Aero (Mometasone furo-formoterol fum) ..... Use two times a day for asthma.    Xolair 150 Mg Solr (Omalizumab) ..... 300mg  every 2 weeks    Proair Hfa 108 (90 Base) Mcg/act Aers (Albuterol sulfate) .[name]... 2 puffs four times a day as needed    Prednisone 10 Mg Tabs (Prednisone) .[name]... Take  4 tabs daily for 5 days by mouth, 3 tabs daily for 5 days, 2 tabs daily for 5 days, 1 tab daily for 5 days and stop.    Duoneb 0.5-2.5 (3) Mg/85ml Soln (Ipratropium-albuterol)    Ipratropium Bromide 0.02 % Soln (Ipratropium bromide) ..... Mix with albuterol in neubulizer and use within 30 mins.  use 4 times daily as needed for sob.  Problem # 2:  HEADACHE (ICD-784.0) The patient's headache is mild and will likely improve as we get better improvement of her asthma exacerbation.  At this point the patient can just take ibuprofen as prescribed below.    Her updated medication list for this problem includes:    Ibuprofen 400 Mg Tabs (Ibuprofen) .[name]... Take one tab by mouth every 6 hours as needed for headache  Problem # 3:  ANEMIA, MILD (ICD-285.9) Pts HGB was 11.1 in the hospital and previous anemia panel done in 09 demonstrated iron deficiency.  There was a high RDW at that time and the patient had a low normal B12.  The patient does not endorse any obvious sources of bleeding at this time but we will recheck a CBC, ferritin and B12 today for further evaluation.  If the patient continues to be anemic she will need further work up for the cause at a future visit.  Orders: T-CBC No Diff ([id number]) T-Ferritin  ([id number]) T-Vitamin B12 ([id number])  Problem # 4:  Hypokalemia Likely secondary to increased albuterol use.  Will recheck today.  Discharge potassium was 3.2.    Problem # 5:  Preventive Health Care (ICD-V70.0) Patient sates that she has already had a flu shot for the year.   Complete Medication List: 1)  Diovan 160 Mg Tabs (Valsartan) .[name].. 1 by mouth once daily 2)  Omeprazole 40 Mg Cpdr (Omeprazole) .[name].. 1 by mouth two times a day 3)  Hydrochlorothiazide 25 Mg Tabs (Hydrochlorothiazide) .... One tablet by mouth daily 4)  Flonase 50 Mcg/act Susp (Fluticasone propionate) .... Take 2 sprays in each nostril daily for 3 days, then one spray in each nostil daily for 2 weeks 5)  Claritin 10 Mg Tabs (Loratadine) .... Take 1 tablet by mouth once a day 6)  Albuterol Sulfate (2.5 Mg/58ml) 0.083% Nebu (Albuterol sulfate) .[name].. 1 neb four times a day as needed 7)  Atrovent Hfa 17 Mcg/act Aers (Ipratropium bromide hfa) .... 2 puffs . qid as needed rescue 8)  Dulera 200-5 Mcg/act Aero (Mometasone furo-formoterol fum) .... Use two times a day for asthma. 9)  Epipen 0.3 Mg/0.83ml Devi (Epinephrine) .... Use as directed as needed severe allergic reaction 10)  Xolair 150 Mg Solr (Omalizumab) .... 300mg  every 2 weeks 11)  Ambien 10 Mg Tabs (Zolpidem tartrate) .... Take 1 by mouth at bedtime as needed to fall sleep. 12)  Proair Hfa 108 (90 Base) Mcg/act Aers (Albuterol sulfate) .... 2 puffs four times a day as needed 13)  Prednisone 10 Mg Tabs (Prednisone) .... Take 4 tabs daily for 5 days by mouth, 3 tabs daily for 5 days, 2 tabs daily for 5 days, 1 tab daily for 5 days and stop. 14)  Avelox 400 Mg Tabs (Moxifloxacin hcl) .[name].. 1 daily 15)  Ambien 10 Mg Tabs (Zolpidem tartrate) .... Take 1 tablet by mouth at bedtime as needed to fall asleep 16)  Claritin 10 Mg Tabs (Loratadine) .... Take 1 tablet by mouth once a day 17)  Duoneb 0.5-2.5 (3) Mg/48ml Soln (Ipratropium-albuterol) 18)  Ipratropium Bromide 0.02 %  Soln (Ipratropium bromide) .Marland KitchenMarland KitchenMarland Kitchen  Mix with albuterol in neubulizer and use within 30 mins.  use 4 times daily as needed for sob. 19)  Ibuprofen 400 Mg Tabs (Ibuprofen) .... Take one tab by mouth every 6 hours as needed for headache  Other Orders: T-Basic Metabolic Panel [id number])  Patient Instructions: 1)  We are going to increase the length of your prednisone taper.  I am also writing you a prescription for ipratropium.  You can mix this with your albuterol in your nebulizer but you must use it within 30 mins.  I would like to see you back in one week for a follow up appointment. For your headache please use Ibuprophen.  Prescriptions: IBUPROFEN 400 MG TABS (IBUPROFEN) Take one tab by mouth every 6 hours as needed for headache  #30 x 1   Entered and Authorized by:   Sinda Du MD   Signed by:   Sinda Du MD on 03/08/2010   Method used:   Print then Give to Patient   RxID:   [id number] IPRATROPIUM BROMIDE 0.02 % SOLN (IPRATROPIUM BROMIDE) Mix with albuterol in neubulizer and use within 30 mins.  Use 4 times daily as needed for SOB.  #1 inhaler x 2   Entered and Authorized by:   Sinda Du MD   Signed by:   Sinda Du MD on 03/08/2010   Method used:   Print then Give to Patient   RxID:   [id number] PREDNISONE 10 MG TABS (PREDNISONE) Take 4 tabs daily for 5 days by mouth, 3 tabs daily for 5 days, 2 tabs daily for 5 days, 1 tab daily for 5 days and stop.  #50 x 0   Entered and Authorized by:   Sinda Du MD   Signed by:   Sinda Du MD on 03/08/2010   Method used:   Print then Give to Patient   RxID:   [id number]    Orders Added: 1)  Est. Patient Level III [44034] 2)  T-Basic Metabolic Panel [[id number]] 3)  T-CBC No Diff [[id number]] 4)  T-Ferritin [[id number]] 5)  T-Vitamin B12 [[id number]]   Process Orders Check Orders Results:     [facility]: ABN not required for this insurance Tests Sent for requisitioning (March 08, 2010 10:04 AM):     03/08/2010: [facility] -- T-Basic Metabolic Panel [phone] (signed)     03/08/2010: [facility] -- T-CBC No Diff [[id number]] (signed)     03/08/2010: [facility] -- T-Ferritin [[id number]] (signed)     03/08/2010: [facility] -- T-Vitamin B12 [[id number]] (signed)     Prevention & Chronic Care Immunizations   Influenza vaccine: Fluvax MCR  (12/27/2009)    Tetanus booster: Not documented    Pneumococcal vaccine: Pneumovax  (03/17/2009)  Other Screening   Pap smear: Not documented   Pap smear action/deferral: Deferred  (04/17/2009)   Smoking status: quit  (03/08/2010)  Lipids   Total Cholesterol: 183  (10/06/2009)   Lipid panel action/deferral: Lipid Panel ordered   LDL: 123  (10/06/2009)   LDL Direct: Not documented   HDL: 47  (10/06/2009)   Triglycerides: 67  (10/06/2009)  Hypertension   Last Blood Pressure: 148 / 92  (03/08/2010)   Serum creatinine: 0.69  (10/06/2009)   Serum potassium 4.0  (10/06/2009)  Self-Management Support :   Personal Goals (by the next clinic visit) :      Personal blood pressure goal: 140/90  (01/18/2009)   Patient will work on the following items until the next clinic  visit to reach self-care goals:     Medications and monitoring: take my medicines every day  (03/08/2010)     Eating: drink diet soda or water instead of juice or soda, eat more vegetables, use fresh or frozen vegetables, eat foods that are low in salt, eat baked foods instead of fried foods, limit or avoid alcohol  (03/08/2010)     Activity: take a 30 minute walk every day  (03/08/2010)    Hypertension self-management support: Resources for patients handout  (03/08/2010)      Resource handout printed.   Appended Document: SOB/gg Ms. Sanjurjo's history and physical were reviewed with Dr. Cathey Endow and we formulated the assessment and plan together.  I agree with the above  documentation.

## 2010-05-29 NOTE — Miscellaneous (Signed)
Summary: Injection Record / [facility]    Injection Record / [facility]    Imported By: Lennie Odor 03/06/2010 15:10:00  _____________________________________________________________________  External Attachment:    Type:   Image     Comment:   External Document

## 2010-05-29 NOTE — Assessment & Plan Note (Signed)
Summary: L shoulder/arm pain/pcp-madera/hla   Vital Signs:  Patient profile:   40 year old female Height:      65 inches (165.10 cm) Weight:      341.8 pounds (155.36 kg) BMI:     57.08 O2 Sat:      100 % on Room air Temp:     97.5 degrees F (36.39 degrees C) oral Pulse rate:   78 / minute BP sitting:   124 / 70  (left arm)  Vitals Entered By: Stanton Kidney Ditzler RN (May 22, 2009 10:39 AM)  O2 Flow:  Room air Is Patient Diabetic? No Pain Assessment Patient in pain? yes     Location: both arms Intensity: 10 Type: aching Onset of pain  past 2 weeks Nutritional Status BMI of > 30 = obese Nutritional Status Detail appetite good  Have you ever been in a relationship where you felt threatened, hurt or afraid?denies   Does patient need assistance? Functional Status Self care Ambulation Normal Comments Problems with asthma. Last vs with Dr Orvis Brill - shot in both arms - both arms sore since injections. To see Dr Maple Hudson 05/25/09.   Primary Care Provider:  Valetta Close MD   History of Present Illness: This is a 40 year old woman with past medical history outlined in the chart who is here for BP recheck.  She should also have a pap smear at this appointment, but she requests to go to gyn for this.  She reports that she continues to have trouble with asthma- wheesing and coughing worst at night.  She is seeing Dr. Maple Hudson for this and has started xolair injections as well as buspar.  She is not sure if she should still be on symbicort.  She has an appointment with him later this week.    She reports that she has been sore in the arms where she got the injections, especially in the left arm.  On further discussion she reports that the pain is higher up in the shoulder and trapezius rather in the deltiod where the shot was given.  She also has neck pain.         Depression History:      The patient denies a depressed mood most of the day and a diminished interest in her usual daily  activities.         Preventive Screening-Counseling & Management  Alcohol-Tobacco     Smoking Status: quit     Year Quit: 2008     Passive Smoke Exposure: no  Caffeine-Diet-Exercise     Does Patient Exercise: yes     Type of exercise: WALKING  Current Medications (verified): 1)  Omeprazole 40 Mg Cpdr (Omeprazole) .[name].. 1 By Mouth Two Times A Day 2)  Proair Hfa 108 (90 Base) Mcg/act Aers (Albuterol Sulfate) .... 2 Puffs Four Times A Day As Needed 3)  Hydrochlorothiazide 25 Mg Tabs (Hydrochlorothiazide) .... One Tablet By Mouth Daily 4)  Flonase 50 Mcg/act Susp (Fluticasone Propionate) .... Take 2 Sprays in Each Nostril Daily For 3 Days, Then One Spray in Each Nostil Daily For 2 Weeks 5)  Claritin 10 Mg Tabs (Loratadine) .... Take 1 Tablet By Mouth Once A Day 6)  Tylenol 325 Mg Tabs (Acetaminophen) .... Take 1-2 Tabs By Mouth Up To Every 6 Hours As Needed For Pain. 7)  Advil 200 Mg Caps (Ibuprofen) .... Take 1 Tab By Mouth  Up To Every 6 Hours As Needed For Pain 8)  Albuterol Sulfate (2.5 Mg/61ml) 0.083%  Nebu (Albuterol Sulfate) .[name].. 1 Neb Four Times A Day As Needed 9)  Ambien 5 Mg Tabs (Zolpidem Tartrate) .... Take 1 Tablet By Mouth At Bedtime 10)  Ultram 50 Mg Tabs (Tramadol Hcl) .... Take 1 Tablet By Mouth Every 8 Hours As Needed For Pain 11)  Peridex 0.12 % Soln (Chlorhexidine Gluconate) .... Swish 15ml For 30 Seconds and Then Spit Twice A Day 12)  Atrovent Hfa 17 Mcg/act Aers (Ipratropium Bromide Hfa) .... 2 Puffs . Qid As Needed Rescue 13)  Dulera 200-5 Mcg/act Aero (Mometasone Furo-Formoterol Fum) .... Use Two Times A Day For Asthma. 14)  Lisinopril 20 Mg Tabs (Lisinopril) .... Take One Tablet Daily For Blood Pressure. 15)  Vicodin Es 7.5-750 Mg Tabs (Hydrocodone-Acetaminophen) .... Take One Tablet Every 8 Hours As Needed For Pain Over The Next 2 Days. 16)  Epipen 0.3 Mg/0.50ml Devi (Epinephrine) .... Use As Directed As Needed Severe Allergic Reaction  Allergies (verified): No  Known Drug Allergies  Review of Systems       per hpi  Physical Exam  General:  alert and overweight-appearing.   Head:  normocephalic and atraumatic.   Eyes:  vision grossly intact, pupils equal, pupils round, and pupils reactive to light.   Lungs:  normal respiratory effort and normal breath sounds.  no wheesing. Heart:  normal rate, regular rhythm, and no murmur.   Msk:  pain with palpation of the left shoulder and upper trapezius.  no restriction of ROM, no deformity or instability.  No obvious swelling, no warmth. Extremities:  no edema Neurologic:  alert & oriented X3, cranial nerves II-XII intact, strength normal in all extremities, and gait normal.   Psych:  Oriented X3, memory intact for recent and remote, flat affect, poor eye contact, and agitated.     Impression & Recommendations:  Problem # 1:  HYPERTENSION, ESSENTIAL NOS (ICD-401.9) BP is great today.  No changes.  Her updated medication list for this problem includes:    Hydrochlorothiazide 25 Mg Tabs (Hydrochlorothiazide) ..... One tablet by mouth daily    Lisinopril 20 Mg Tabs (Lisinopril) .[name]... Take one tablet daily for blood pressure.  BP today: 124/70 Prior BP: 122/84 (05/11/2009)  Labs Reviewed: K+: 4.3 (04/17/2009) Creat: : 0.61 (04/17/2009)   Chol: 179 (04/17/2009)   HDL: 63 (04/17/2009)   LDL: 93 (04/17/2009)   TG: 114 (04/17/2009)  Problem # 2:  OBESITY, MORBID (ICD-278.01) We have discussed this at length as a contributing factor to her asthma, GERD and knee pain.  She has made no progress towards weight loss and does not seem motivated.  Problem # 3:  ASTHMA (ICD-493.90) Is following with Dr young.  Advised her that in the future she does not need to make two appointments and miss two days of work for asthma appointments.  He is a specialist and is using advanced treatments with her, she should call him with questions regarding asthma.  I see in his notes that he has refered her to gi - she did not  keep appointment, has ordered PFT's - she missed this appointment as well.  She had sleep study in 2008 showing mild OSA but no mask was recomended at that time.  Her updated medication list for this problem includes:    Proair Hfa 108 (90 Base) Mcg/act Aers (Albuterol sulfate) .[name]... 2 puffs four times a day as needed    Albuterol Sulfate (2.5 Mg/64ml) 0.083% Nebu (Albuterol sulfate) .[name]... 1 neb four times a day as needed  Atrovent Hfa 17 Mcg/act Aers (Ipratropium bromide hfa) .[name]... 2 puffs . qid as needed rescue    Dulera 200-5 Mcg/act Aero (Mometasone furo-formoterol fum) ..... Use two times a day for asthma.  Problem # 4:  GERD (ICD-530.81) feels that omeprazole is not working, reviewed lifestyle changes to reduce GERD.  again, she does not seem motivated. Has been refered to gi but did not keep appointment.     Her updated medication list for this problem includes:    Omeprazole 40 Mg Cpdr (Omeprazole) .[name]... 1 by mouth two times a day  Problem # 5:  ARM PAIN, LEFT (ICD-729.5) Left arm pain that started at the time of xolair injection, but is not locataed at the site of the shot. Seems to be associated with trapezius muscle spasm or pinched nerve.  Will get xray to look at C-spine.  Orders: Diagnostic X-Ray/Fluoroscopy (Diagnostic X-Ray/Flu)  Problem # 6:  Preventive Health Care (ICD-V70.0) Refuses pap smear here.  Requests referal to gyn.  Problem # 7:  KNEE PAIN, LEFT (ICD-719.46)  The following medications were removed from the medication list:    Ultram 50 Mg Tabs (Tramadol hcl) .[name]... Take 1 tablet by mouth every 8 hours as needed for pain    Vicodin Es 7.5-750 Mg Tabs (Hydrocodone-acetaminophen) .[name]... Take one tablet every 8 hours as needed for pain over the next 2 days. Her updated medication list for this problem includes:    Tylenol 325 Mg Tabs (Acetaminophen) .[name]... Take 1-2 tabs by mouth up to every 6 hours as needed for pain.    Advil 200 Mg Caps (Ibuprofen) .[name]... Take  1 tab by mouth  up to every 6 hours as needed for pain  Complete Medication List: 1)  Omeprazole 40 Mg Cpdr (Omeprazole) .[name].. 1 by mouth two times a day 2)  Proair Hfa 108 (90 Base) Mcg/act Aers (Albuterol sulfate) .... 2 puffs four times a day as needed 3)  Hydrochlorothiazide 25 Mg Tabs (Hydrochlorothiazide) .... One tablet by mouth daily 4)  Flonase 50 Mcg/act Susp (Fluticasone propionate) .... Take 2 sprays in each nostril daily for 3 days, then one spray in each nostil daily for 2 weeks 5)  Claritin 10 Mg Tabs (Loratadine) .... Take 1 tablet by mouth once a day 6)  Tylenol 325 Mg Tabs (Acetaminophen) .... Take 1-2 tabs by mouth up to every 6 hours as needed for pain. 7)  Advil 200 Mg Caps (Ibuprofen) .... Take 1 tab by mouth  up to every 6 hours as needed for pain 8)  Albuterol Sulfate (2.5 Mg/1ml) 0.083% Nebu (Albuterol sulfate) .[name].. 1 neb four times a day as needed 9)  Ambien 5 Mg Tabs (Zolpidem tartrate) .... Take 1 tablet by mouth at bedtime 10)  Atrovent Hfa 17 Mcg/act Aers (Ipratropium bromide hfa) .... 2 puffs . qid as needed rescue 11)  Dulera 200-5 Mcg/act Aero (Mometasone furo-formoterol fum) .... Use two times a day for asthma. 12)  Lisinopril 20 Mg Tabs (Lisinopril) .... Take one tablet daily for blood pressure. 13)  Epipen 0.3 Mg/0.37ml Devi (Epinephrine) .... Use as directed as needed severe allergic reaction  Other Orders: Gynecologic Referral (Gyn)  Patient Instructions: 1)  Please schedule a follow-up appointment in 6 months. 2)  Your blood pressure is great today. 3)  You will have xrays of your neck. 4)  You should have a pap smear in the next few months, [facility].  Please call them for  appointment [phone]. 5)  Please keep your appointment with Dr. Maple Hudson. 6)  You need to lose weight. Consider a lower calorie diet and regular exercise.    Prevention & Chronic Care Immunizations   Influenza vaccine: Fluvax 3+   (03/03/2009)    Tetanus booster: Not documented    Pneumococcal vaccine: Pneumovax  (03/17/2009)  Other Screening   Pap smear: Not documented   Pap smear action/deferral: Deferred  (04/17/2009)   Smoking status: quit  (05/22/2009)  Lipids   Total Cholesterol: 179  (04/17/2009)   Lipid panel action/deferral: Lipid Panel ordered   LDL: 93  (04/17/2009)   LDL Direct: Not documented   HDL: 63  (04/17/2009)   Triglycerides: 114  (04/17/2009)  Hypertension   Last Blood Pressure: 124 / 70  (05/22/2009)   Serum creatinine: 0.61  (04/17/2009)   Serum potassium 4.3  (04/17/2009)    Hypertension flowsheet reviewed?: Yes   Progress toward BP goal: Unchanged  Self-Management Support :   Personal Goals (by the next clinic visit) :      Personal blood pressure goal: 140/90  (01/18/2009)   Patient will work on the following items until the next clinic visit to reach self-care goals:     Medications and monitoring: take my medicines every day, bring all of my medications to every visit  (05/22/2009)     Eating: drink diet soda or water instead of juice or soda, eat more vegetables, use fresh or frozen vegetables, eat foods that are low in salt, eat baked foods instead of fried foods, eat fruit for snacks and desserts, limit or avoid alcohol  (05/22/2009)     Activity: take a 30 minute walk every day  (05/22/2009)    Hypertension self-management support: Written self-care plan  (01/18/2009)

## 2010-05-29 NOTE — Assessment & Plan Note (Signed)
Summary: Hospital Note  INTERNAL MEDICINE ADMISSION HISTORY AND PHYSICAL  PCP: Dr. Vassie Loll Attending: Dr. Mariea Stable First Contact: Christin Gethers MSIV [phone]) Second Contact: Dr. Denton Meek [phone])  CC: SOB  HPI: Patient is a 40 year old female who presents with a chief complaint of asthma attack. The history was provided by the patient. Pt w/ h/o asthma presents with chest tightness, SOB, wheezing and cough x 5 days.  Has been using atrovent neb and advair w/out relief.  Per prior chart, seen for same on 02/28/10 and placed on asthma protocol.  Received a continuous neb tmnt and 2g IV mg and continued to have continuous expiratory wheezing and increased work of breathing.  Admission was recommended but patient preferred to go home.  Followed up with pulmonologist and placed on medrol dose pack which she has taken compliantly. Symptoms have not improved.  Denies fever/chills and otherwise feeling well.    Patient followed up with Dr. Joni Fears (pulmonologsit) on March 01, 2010.  At that time she received antibiotics (Avelox) for 5 days and possibly a steroid injection (patient is uncertain as to what type of injection it was). Patient also complains of headache not associated with dizziness, alleviated by Excedrin.  ALLERGIES: ACE INHIBITORS/ LISINOPRIL --> cough  PAST MEDICAL HISTORY: ACANTHOSIS NIGRICANS ASTHMA, EXTRINSIC W/ACUTE EXACERBATION  HYPERTENSION OBESITY, MORBID (BMI 55) HELICOBACTER PYLORI INFECTION  SLEEP APNEA -Last Sleep Study (03/2007) - very mild obstructive sleep apnea/hypoplnea syndrome, AHI 5.8 per hour, RDI 7.8 per hour. NOrmal range 0-5 per hour. Most events occurred while in REM sleep. Loud snoring with oxygen desaturation to a nadir of 89%. CPAP not recommended. COPD  ASTHMA  (Skin test 02/01/09 Pos, no h/o of intubation, no h/o oxygen) Hemorrhoids DEPRESSION (no h/o suicidal ideation, no pysch admission) GERD  FORMER TOBACCO USER (pt quit 2  years ago, previously smoke 3-4 cig/dayx15 years) FORMER DRUG USE - cocaine. UDS (+) 2006 TUBAL LIGATION  MEDICATIONS: DIOVAN 160 MG TABS (VALSARTAN) 1 by mouth once daily OMEPRAZOLE 40 MG CPDR (OMEPRAZOLE) 1 by mouth two times a day HYDROCHLOROTHIAZIDE 25 MG TABS (HYDROCHLOROTHIAZIDE) one tablet by mouth daily FLONASE 50 MCG/ACT SUSP (FLUTICASONE PROPIONATE) Take 2 sprays in each nostril daily for 3 days, then one spray in each nostil daily for 2 weeks CLARITIN 10 MG TABS (LORATADINE) Take 1 tablet by mouth once a day ALBUTEROL SULFATE (2.5 MG/3ML) 0.083% NEBU (ALBUTEROL SULFATE) 1 neb four times a day as needed ATROVENT HFA 17 MCG/ACT AERS (IPRATROPIUM BROMIDE HFA) 2 puffs . qid as needed rescue DULERA 200-5 MCG/ACT AERO (MOMETASONE FURO-FORMOTEROL FUM) Use two times a day for asthma. EPIPEN 0.3 MG/0.3ML DEVI (EPINEPHRINE) Use as directed as needed severe allergic reaction XOLAIR 150 MG SOLR (OMALIZUMAB) 300mg  every 2 weeks AMBIEN 10 MG TABS (ZOLPIDEM TARTRATE) take 1 by mouth at bedtime as needed to fall sleep. PROAIR HFA 108 (90 BASE) MCG/ACT AERS (ALBUTEROL SULFATE) 2 puffs four times a day as needed PREDNISONE 10 MG TABS (PREDNISONE) As Directed AVELOX 400 MG TABS (MOXIFLOXACIN HCL) 1 daily  SOCIAL HISTORY: Social History: Daughter in [employer] Not married Former smoker. 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  FAMILY HISTORY Mother - HTN Daughter, grandmother, neice- asthma Aunt died- lung cancer  ROS: per HPI  VITALS: T: 98.5    P: 84    BP: 124/61    R: 21    O2SAT:99% ON:RA  PHYSICAL EXAM: General:  alert, well-developed, and cooperative to examination.  Head:  normocephalic and atraumatic.   Eyes:  PERRL, EOMI   Mouth:  MMM   Neck:  supple, full ROM, no thyromegaly, no JVD, and no carotid bruits.   Lungs:  Diffuse expiratory wheeze, no accessory muscle  use CV: RRR, no M/R/G Abdomen: NABS, NT/ND Neurologic:  A&Ox3, CN II-XII intact Skin:  turgor normal and no rashes.    LABS: CBC w/ diff  WBC                                      15.2       h      4.0-10.5         K/uL  RBC                                      4.13              3.87-5.11        MIL/uL  Hemoglobin (HGB)                         11.4       l      12.0-15.0        g/dL  Hematocrit (HCT)                         35.2       l      36.0-46.0        %  MCV                                      85.2              78.0-100.0       fL  MCH -                                    27.6              26.0-34.0        pg  MCHC                                     32.4              30.0-36.0        g/dL  RDW                                      16.6       h      11.5-15.5        %  Platelet Count (PLT)                     391               150-400          K/uL  Neutrophils, %  91         h      43-77            %  Lymphocytes, %                           8          l      12-46            %  Monocytes, %                             1          l      3-12             %  Eosinophils, %                           0                 0-5              %  Basophils, %                             0                 0-1              %  Neutrophils, Absolute                    13.8       h      1.7-7.7          K/uL  Lymphocytes, Absolute                    1.3               0.7-4.0          K/uL  Monocytes, Absolute                      0.2               0.1-1.0          K/uL  Eosinophils, Absolute                    0.0               0.0-0.7          K/uL  Basophils, Absolute                      0.0               0.0-0.1          K/uL  BMET  Sodium (NA)                              135               135-145          mEq/L  Potassium (K)  3.4        l      3.5-5.1          mEq/L  Chloride                                 104               96-112            mEq/L  CO2                                      25                19-32            mEq/L  Glucose                                  200        h      70-99            mg/dL  BUN                                      8                 6-23             mg/dL  Creatinine                               0.70              0.4-1.2          mg/dL  GFR, Est Non African American            >60               >60              mL/min  GFR, Est African American                >60               >60              mL/min    Oversized comment, see footnote  1  Calcium                                  8.4               8.4-10.5         mg/dL  UA Color, Urine                             YELLOW            YELLOW  Appearance                               CLEAR  CLEAR  Specific Gravity                         1.013             1.005-1.030  pH                                       6.0               5.0-8.0  Urine Glucose                            NEGATIVE          NEG              mg/dL  Bilirubin                                NEGATIVE          NEG  Ketones                                  NEGATIVE          NEG              mg/dL  Blood                                    NEGATIVE          NEG  Protein                                  NEGATIVE          NEG              mg/dL  Urobilinogen                             0.2               0.0-1.0          mg/dL  Nitrite                                  NEGATIVE          NEG  Leukocytes                               NEGATIVE          NEG    MICROSCOPIC NOT DONE ON URINES WITH NEGATIVE PROTEIN, BLOOD, LEUKOCYTES,    NITRITE, OR GLUCOSE <1000 mg/dL.  UDS  Amphetamins                              SEE NOTE.         NDT    NONE DETECTED  Barbiturates  SEE NOTE.         NDT    Oversized comment, see footnote  1  Benzodiazepines                          SEE NOTE.         NDT    NONE DETECTED  Cocaine                                   SEE NOTE.         NDT    NONE DETECTED  Opiates                                  SEE NOTE.         NDT    NONE DETECTED  Tetrahydrocannabinol                     SEE NOTE.         NDT    NONE DETECTED  HgA1C  Hemoglobin A1C                           5.5               4.6-6.1          %    Oversized comment, see footnote  1  Mean Plasma Glucose                      111                                mg/dL    Reference range: <841    Performed at [facility]    CXR:  No active disease.  Nonspecific increased bony density as above suggestive of some type of underlying systemic disease. Clinical correlation suggested.   ASSESSMENT AND PLAN: (1) Acute ashtmal exacerbation - likely 2/2 to acutre URI vs missing Xolair dose during seasonal change leading to allergies vs. constant smoke aggrivation by having smoker in home.  CXR is negative for new infiltrates, patient remains afebrile so is less likely that symptoms of PNA. Although patient was started on Avelox (decided not to continue, due to absence of signs suggestive of PNA). - started Duoneb tx q6 hrs for 24 hrs then as needed - continue flonase for nasal congestion - will check CBC, BMET, now & in AM - started Advair 500/50  (2) Chest pain - likely MSK & related to cough & asthma exacerbation. Pt has past h/o cocaine use - will check EKG once patient arrives on floor  (3) GERD w/ prior H. pylori infection - cont protonix  (4) Sleep apnea - mild does not require CPAP @ this time  (5) COPD  - continue meds from prob #1   (6) HTN - continue home meds  (7) Insomnia - Ambien 5 mg by mouth at bedtime as needed for sleep  (8) PPx - Colace  (9) DVT PPx - Heparin [address] Q8 HRS   ATTENDING PHYSICIAN: I discussed the case with the resident(s) as noted ad reviewed the resident's notes. I agree with the finding and plan - please refer to the  attending physician note for more  details.  Signature__________________________________________  Printed Name_______________________________________

## 2010-05-29 NOTE — Assessment & Plan Note (Signed)
Summary: xolair/apc  Nurse Visit   Allergies: No Known Drug Allergies  Medication Administration  Injection # 1:    Medication: Xolair (omalizumab) 150mg     Diagnosis: EXTRINSIC ASTHMA, UNSPECIFIED (ICD-493.00)    Route: SQ    Site: R deltoid    Exp Date: 08/27/2012    Lot #: [id number]    Mfr: [email or web address]    Comments: 0.9 ML IN RIGHT AND LEFT ARM PT WAITED 20 MINS  CHARGED 96401 X 2    Patient tolerated injection without complications    Given by: SUSANNE FORD [facility]    Medication Administration  Injection # 1:    Medication: Xolair (omalizumab) 150mg     Diagnosis: EXTRINSIC ASTHMA, UNSPECIFIED (ICD-493.00)    Route: SQ    Site: R deltoid    Exp Date: 08/27/2012    Lot #: [id number]    Mfr: GENENTECH    Comments: 0.9 ML IN RIGHT AND LEFT ARM PT WAITED 20 MINS  CHARGED 96401 X 2    Patient tolerated injection without complications    Given by: SUSANNE FORD [facility]

## 2010-05-29 NOTE — Assessment & Plan Note (Signed)
Summary: asthma flareup ///kp   Copy to:  [facility] Primary Provider/Referring Provider:  Valetta Close MD  CC:  Acute Visit , pt seen at [facility] ED on 02/28/10, and c/o chest tightness prod cough yellow.  History of Present Illness: November 24, 2009- Asthma, rhinitis She was seen here for acute flare of asthma, given prednisone taper and switched from Lisinopril to Diovan last visit.  She said at first that she felt  better in her throat with this change. She is taking Herbalife products trying to lose weight. She also feels Xolair continues to "do great" . Today her breathing is good. She says in the current hot, "orange" air qulaity she is using her rescue inhaler more than once daily. 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 As we talk, she begins to complain that throat is itching and demonstrates dry cough.  January 31, 2010- Asthma / Xolair, rhinitis Post Hospital-asthma flare up per pt. She made several trips to ER about infected felon on her right index finger and was given doxycycline then bactrim with surgical drainage. She says for 2 weeks she has been having more trouble with asthma and nasal congestion, but doesn't think it is a cold.  Xolair has continued to help and she observes she hasn't needed to be hosp for her asthma. Today she feels watery nose and little wheeze- not bad. Taking prednisone 3 daily x 5 days. She has her meds. Had flu vax.   March 01, 2010- Asthma / Xolair, rhinitis Nurse-CC: Acute Visit , pt seen at [facility] ED on 02/28/10, c/o chest tightness prod cough yellow Seen yesterday at ER and is now on medrol taper. She says this started when she turned on car heater 2 days ago. Got Nyquil that night for ? a cold, but began wheezing and went to ER- neb, magnesium, IV steroid and a medrol taper. Now throbbing frontal headache, some wheeze. She didn't stay at ER because she was uncomfortable and didn't want to see if they could find her a  room. Due for Xolair shot today. Sputum some yellow. Denies smoking but "around it sometimes".  Asthma History    Asthma Control Assessment:    Age range: 12+ years    Symptoms: throughout the day    Nighttime Awakenings: 1-3/week    Interferes w/ normal activity: some limitations    SABA use (not for EIB): several times per day    FEV1 Pred: 3.38 liters (today)    Asthma Control Assessment: Very Poorly Controlled   Preventive Screening-Counseling & Management  Alcohol-Tobacco     Smoking Status: quit     Year Quit: 2008     Passive Smoke Exposure: no  Caffeine-Diet-Exercise     Does Patient Exercise: yes     Type of exercise: WALKING  Current Medications (verified): 1)  Diovan 160 Mg Tabs (Valsartan) .[name].. 1 By Mouth Once Daily 2)  Omeprazole 40 Mg Cpdr (Omeprazole) .[name].. 1 By Mouth Two Times A Day 3)  Hydrochlorothiazide 25 Mg Tabs (Hydrochlorothiazide) .... One Tablet By Mouth Daily 4)  Flonase 50 Mcg/act Susp (Fluticasone Propionate) .... Take 2 Sprays in Each Nostril Daily For 3 Days, Then One Spray in Each Nostil Daily For 2 Weeks 5)  Claritin 10 Mg Tabs (Loratadine) .... Take 1 Tablet By Mouth Once A Day 6)  Albuterol Sulfate (2.5 Mg/78ml) 0.083% Nebu (Albuterol Sulfate) .[name].. 1 Neb Four Times A Day As Needed 7)  Atrovent Hfa 17  Mcg/act Aers (Ipratropium Bromide Hfa) .... 2 Puffs . Qid As Needed Rescue 8)  Dulera 200-5 Mcg/act Aero (Mometasone Furo-Formoterol Fum) .... Use Two Times A Day For Asthma. 9)  Epipen 0.3 Mg/0.33ml Devi (Epinephrine) .... Use As Directed As Needed Severe Allergic Reaction 10)  Xolair 150 Mg Solr (Omalizumab) .... 300mg  Every 2 Weeks 11)  Ambien 10 Mg Tabs (Zolpidem Tartrate) .... Take 1 By Mouth At Bedtime As Needed To Fall Sleep. 12)  Proair Hfa 108 (90 Base) Mcg/act Aers (Albuterol Sulfate) .... 2 Puffs Four Times A Day As Needed 13)  Prednisone 10 Mg Tabs (Prednisone) .... As Directed  Allergies (verified): 1)  ! * Ace Inhibitors/  Lisinopril  Past History:  Past Medical History: Last updated: 02/01/2009 ACANTHOSIS NIGRICANS (ICD-701.2) FOOT PAIN, RIGHT (ICD-729.5) MENORRHAGIA (ICD-626.2) ASTHMA, EXTRINSIC W/ACUTE EXACERBATION (ICD-493.02) HYPERTENSION, ESSENTIAL NOS (ICD-401.9) DENTAL PAIN (ICD-525.9) INSOMNIA (ICD-780.52) OBESITY, MORBID (ICD-278.01) ABDOMINAL PAIN (ICD-789.00) HELICOBACTER PYLORI INFECTION (ICD-041.86) SLEEP APNEA (ICD-780.57) COPD (ICD-496) ASTHMA (ICD-493.90)- Skin test 02/01/09 Pos Hemorrhoids. DEPRESSION (ICD-311) GERD (ICD-530.81) WRIST PAIN, RIGHT (ICD-719.43) TOBACCO USER (ICD-305.1) TUBAL LIGATION, HX OF (ICD-V26.51)  Past Surgical History: Last updated: 04/10/2006 Tubal ligation, bilateral, 1996  Family History: Last updated: 12/16/2008 daughter, grandmother, neice- asthma Aunt died- lung cancer  Social History: Last updated: 12/16/2008 Daughter in [employer] Not married Former smoker. 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  Risk Factors: Exercise: yes (03/01/2010)  Risk Factors: Smoking Status: quit (03/01/2010) Passive Smoke Exposure: no (03/01/2010)  Social History: Residence:  yes  Review of Systems      See HPI       The patient complains of shortness of breath with activity, shortness of breath at rest, productive cough, non-productive cough, headaches, and nasal congestion/difficulty breathing through nose.  The patient denies coughing up blood, chest pain, irregular heartbeats, acid heartburn, indigestion, loss of appetite, weight change, abdominal pain, difficulty swallowing, sore throat, tooth/dental problems, sneezing, itching, ear ache, rash, and fever.    Vital Signs:  Patient profile:   40 year old female Height:      66 inches Weight:      325 pounds BMI:     52.65 O2 Sat:      99 % on Room air Pulse rate:   85 / minute BP  sitting:   120 / 72  (left arm)  Vitals Entered By: Renold Genta RCP, LPN (March 01, 2010 1:29 PM)  O2 Flow:  Room air CC: Acute Visit , pt seen at [facility] ED on 02/28/10, c/o chest tightness prod cough yellow Comments Medications reviewed with patient Renold Genta RCP, LPN  March 01, 2010 1:33 PM    Physical Exam  Additional Exam:  General: A/Ox3; pleasant and cooperative, NAD, very obese, resting room air sat 99%. Strong perfume (is she trying to mask another odor?) SKIN: no rash, lesions.  NODES: no lymphadenopathy HEENT: [place]/AT, EOM- WNL , TM-WNL, Nose- wet sniffing mild erythema, white mucus, Throat- clear and wnl, , Mallampati IV, multi dental caries/ upper plate, no drainage NECK: Supple w/ fair ROM, JVD- none, normal carotid impulses w/o bruits Thyroid-nonpalpable  CHEST: bilateral wheeze, unlabored, no stridor noted . Poor airflow c/w obesity hypoventilation HEART: RRR, no m/g/r heard ABDOMEN: Soft and nl; ZOX:WRUE, nl pulses, no edema  NEURO: Grossly intact to observation      Pre-Spirometry FEV1    Pred:  3.38 L     Impression & Recommendations:  Problem # 1:  EXTRINSIC ASTHMA, UNSPECIFIED (ICD-493.00)  Lingering exacerbation. I will see if we can turn it with neb, depo and samples of Avelox as she continues medrol taper.  She does not admit smoking now.   Her updated medication list for this problem includes:    Albuterol Sulfate (2.5 Mg/73ml) 0.083% Nebu (Albuterol sulfate) .[name]... 1 neb four times a day as needed    Atrovent Hfa 17 Mcg/act Aers (Ipratropium bromide hfa) .[name]... 2 puffs . qid as needed rescue    Dulera 200-5 Mcg/act Aero (Mometasone furo-formoterol fum) ..... Use two times a day for asthma.    Xolair 150 Mg Solr (Omalizumab) ..... 300mg  every 2 weeks    Proair Hfa 108 (90 Base) Mcg/act Aers (Albuterol sulfate) .[name]... 2 puffs four times a day as needed    Prednisone 10 Mg Tabs (Prednisone) .[name]... As directed  Orders: Depo- Medrol 80mg   (J1040) Admin of Therapeutic Inj  intramuscular or subcutaneous (16109) Nebulizer Tx (60454) Bicillin CR 1.2 million units Injection (U9811) Administration xolair injection (91478)  Problem # 2:  RHINITIS (ICD-472.0)  Persistent rhinitis. I can't tell if she has a sinusitis as basis for headache,  but favor this being a muscle tension headache.  Try Neti pot, phenylephrine.  Problem # 3:  GASTROESOPHAGEAL REFLUX DISEASE, CHRONIC (ICD-530.81) Did again review reflux control measures.  Her updated medication list for this problem includes:    Omeprazole 40 Mg Cpdr (Omeprazole) .[name]... 1 by mouth two times a day  Medications Added to Medication List This Visit: 1)  Prednisone 10 Mg Tabs (Prednisone) .... As directed 2)  Avelox 400 Mg Tabs (Moxifloxacin hcl) .[name].. 1 daily  Other Orders: Est. Patient Level III (29562)  Patient Instructions: 1)  Keep scheduled appointment- earlier if needed 2)  Suggest you don't wear really strong perfumes that might aggravate your asthma 3)  Try otc decongestant phenylephrine (Sudafed-PE and others) if needed for stopped up nose 4)  continue your steroid pill taper 5)  neb xop 1.25 6)  depo 80 7)  samples Avelox 400mg , 1 daily for infection.  Prescriptions: AVELOX 400 MG TABS (MOXIFLOXACIN HCL) 1 daily  #5 x 0   Entered and Authorized by:   [name] MD   Signed by:   [name] MD on 03/01/2010   Method used:   Samples Given   RxID:   [id number]    Medication Administration  Injection # 1:    Medication: Depo- Medrol 80mg     Diagnosis: EXTRINSIC ASTHMA, UNSPECIFIED (ICD-493.00)    Route: IM    Site: LUOQ gluteus    Exp Date: 09/2012    Lot #: [id number]    Mfr: Pharmacia    Patient tolerated injection without complications    Given by: Randell Loop CMA (March 01, 2010 2:08 PM)  Injection # 3:    Medication: Xolair (omalizumab) 150mg     Diagnosis: EXTRINSIC ASTHMA, UNSPECIFIED (ICD-493.00)    Route: IM    Site: R  deltoid    Exp Date: 01/27/2013    Lot #: [id number]    Mfr: Salome Spotted    Comments: xolair 225 unit 60, 0.9 ml x right deltoid, pt didn't wait.     Given by: Drucie Opitz, CN  Injection # 4:    Medication: Bicillin CR 1.2 million units Injection    Diagnosis: EXTRINSIC ASTHMA, UNSPECIFIED (ICD-493.00)    Route: IM    Site: R  deltoid    Exp Date: 04/10/2012    Lot #: [id number]    Mfr: American Regent  Orders Added: 1)  Est. Patient Level III [23557] 2)  Depo- Medrol 80mg  [J1040] 3)  Admin of Therapeutic Inj  intramuscular or subcutaneous [96372] 4)  Nebulizer Tx [94640] 5)  Bicillin CR 1.2 million units Injection [J0558] 6)  Administration xolair injection [32202]    Medication Administration  Injection # 1:    Medication: Depo- Medrol 80mg     Diagnosis: EXTRINSIC ASTHMA, UNSPECIFIED (ICD-493.00)    Route: IM    Site: LUOQ gluteus    Exp Date: 09/2012    Lot #: [id number]    Mfr: Pharmacia    Patient tolerated injection without complications    Given by: Randell Loop CMA (March 01, 2010 2:08 PM)  Injection # 3:    Medication: Xolair (omalizumab) 150mg     Diagnosis: EXTRINSIC ASTHMA, UNSPECIFIED (ICD-493.00)    Route: IM    Site: R deltoid    Exp Date: 01/27/2013    Lot #: [id number]    Mfr: Genetech    Comments: xolair 225 unit 60, 0.9 ml x right deltoid, pt didn't wait.     Given by: Drucie Opitz, CN  Injection # 4:    Medication: Bicillin CR 1.2 million units Injection    Diagnosis: EXTRINSIC ASTHMA, UNSPECIFIED (ICD-493.00)    Route: IM    Site: R deltoid    Exp Date: 04/10/2012    Lot #: [id number]    Mfr: American Regent  Orders Added: 1)  Est. Patient Level III [31517] 2)  Depo- Medrol 80mg  [J1040] 3)  Admin of Therapeutic Inj  intramuscular or subcutaneous [96372] 4)  Nebulizer Tx [94640] 5)  Bicillin CR 1.2 million units Injection [J0558] 6)  Administration xolair injection [61607]

## 2010-05-29 NOTE — Progress Notes (Signed)
Summary: PRESCRIPT  Phone Note Call from Patient   Caller: Patient Call For: [name] Summary of Call: NEED DULERA REFILL  Initial call taken by: Rickard Patience,  May 22, 2009 11:35 AM  Follow-up for Phone Call        Spoke with pt and made aware that rx has been sent. Follow-up by: Vernie Murders,  May 22, 2009 2:38 PM    Prescriptions: DULERA 200-5 MCG/ACT AERO (MOMETASONE FURO-FORMOTEROL FUM) Use two times a day for asthma.  #1 x 11   Entered by:   Vernie Murders   Authorized by:   [name] MD   Signed by:   Vernie Murders on 05/22/2009   Method used:   Electronically to        [facility] (retail)       [address]       [place], [place]  [id number]       Ph: [phone]       Fax: [phone]   RxID:   [id number]

## 2010-05-29 NOTE — Letter (Signed)
Summary: [employer]: Advertising account executive: FMLA   Imported By: Florinda Marker 05/29/2009 16:25:09  _____________________________________________________________________  External Attachment:    Type:   Image     Comment:   External Document

## 2010-05-29 NOTE — Assessment & Plan Note (Signed)
Summary: xolair/ mbw  Nurse Visit   Allergies: No Known Drug Allergies  Medication Administration  Injection # 1:    Medication: Xolair (omalizumab) 150mg     Diagnosis: EXTRINSIC ASTHMA, UNSPECIFIED (ICD-493.00)    Route: SQ    Site: R deltoid    Exp Date: 08/27/2012    Lot #: [id number]    Mfr: GENENTECH    Comments: 0.9 ML IN RIGHT ARM AND 0.9 ML IN LEFT PT WAITED 20 MINS    Patient tolerated injection without complications    Given by: SUSANNE FORD IN [facility]  Orders Added: 1)  Administration xolair injection [id number]   Medication Administration  Injection # 1:    Medication: Xolair (omalizumab) 150mg     Diagnosis: EXTRINSIC ASTHMA, UNSPECIFIED (ICD-493.00)    Route: SQ    Site: R deltoid    Exp Date: 08/27/2012    Lot #: [id number]    Mfr: GENENTECH    Comments: 0.9 ML IN RIGHT ARM AND 0.9 ML IN LEFT PT WAITED 20 MINS    Patient tolerated injection without complications    Given by: SUSANNE FORD IN [facility]  Orders Added: 1)  Administration xolair injection [81191]

## 2010-05-29 NOTE — Progress Notes (Signed)
Summary: samples of Dulera and Proventil  Phone Note Call from Patient   Caller: Patient Call For: young Summary of Call: need samples of proair and dulera Initial call taken by: Rickard Patience,  October 12, 2009 8:39 AM  Follow-up for Phone Call        called and spoke with pt.  pt aware 1 sample of Dulera and 1 sample of Proventil left at front desk for pt to pick up.  Aundra Millet Reynolds LPN  October 12, 2009 9:25 AM

## 2010-05-29 NOTE — Progress Notes (Signed)
Summary: appt/ hla  Phone Note Call from Patient   Summary of Call: has had 2 episodes where" heart feels like it's pounding out of chest" appt desired, given at request 6/3. Initial call taken by: Marin Roberts RN,  Sep 20, 2009 5:55 PM

## 2010-05-29 NOTE — Assessment & Plan Note (Signed)
Summary: xolair///kp  Nurse Visit   Allergies: No Known Drug Allergies  Medication Administration  Injection # 1:    Medication: Xolair (omalizumab) 150mg     Diagnosis: EXTRINSIC ASTHMA, UNSPECIFIED (ICD-493.00)    Route: SQ    Site: R deltoid    Exp Date: 08/27/2012    Lot #: [id number]    Mfr: GENETECH    Comments: 0.9ML RIGHT AND LEFT ARM PT WAITED 30 MINS    Patient tolerated injection without complications    Given by: TAMMY SCOTT IN [facility]ERGY [facility]  Orders Added: 1)  Administration xolair injection [id number]   Medication Administration  Injection # 1:    Medication: Xolair (omalizumab) 150mg     Diagnosis: EXTRINSIC ASTHMA, UNSPECIFIED (ICD-493.00)    Route: SQ    Site: R deltoid    Exp Date: 08/27/2012    Lot #: [id number]    Mfr: GENETECH    Comments: 0.9ML RIGHT AND LEFT ARM PT WAITED 30 MINS    Patient tolerated injection without complications    Given by: TAMMY SCOTT IN [facility]  Orders Added: 1)  Administration xolair injection [78295]

## 2010-05-29 NOTE — Progress Notes (Signed)
Summary: sample dulera  Phone Note Call from Patient Call back at Home Phone [phone]   Caller: Patient Call For: dr young Summary of Call: Patient will be here for a Xolair injection and would like to know if she can pick up a sample of her Dulera. Initial call taken by: Tivis Ringer, CNA,  July 07, 2009 11:41 AM  Follow-up for Phone Call        Pt came in for injection;gave sample to last.[name].

## 2010-05-29 NOTE — Assessment & Plan Note (Signed)
Summary: hfu/jd  Pt seen by CDY today for HFU.Reynaldo Minium CMA  January 31, 2010 11:57 AM        Allergies: 1)  ! * Ace Inhibitors/ Lisinopril   Other Orders: No Charge Patient Arrived (NCPA0) (NCPA0)

## 2010-05-29 NOTE — Assessment & Plan Note (Signed)
Summary: routine check/gg   Vital Signs:  Patient profile:   40 year old female Height:      65 inches (165.10 cm) Weight:      335.7 pounds (152.59 kg) BMI:     56.07 Temp:     98.5 degrees F (36.94 degrees C) oral Pulse rate:   74 / minute BP sitting:   140 / 83  (right arm)  Vitals Entered By: Stanton Kidney Ditzler RN (June 23, 2009 4:15 PM) Is Patient Diabetic? No Pain Assessment Patient in pain? yes     Location: left knee Intensity: 7 Type: aching Onset of pain  long time Nutritional Status BMI of > 30 = obese Nutritional Status Detail appetite good  Have you ever been in a relationship where you felt threatened, hurt or afraid?denies   Does patient need assistance? Functional Status Self care Ambulation Normal Comments Ck-up and not sleeping - Sleep study 07/06/09. ? time for left knee injection.   Primary Care Provider:  Valetta Close MD   History of Present Illness: 40 y/o female with pmh as described on the EMR; who comes to the clinic for followup of chronic problems and also to get refill on her medications. Patient was recently seen by her pulmunologist secondary to mild exacerbation of her asthma.   She was also complaining of mild to moderate allergic symptoms like (runny nose, nasal congestion, PND andsore throat), she was prescribed flonase and also daily claritin. Patient has been compliant with medications and is feeling good, except for some difficulty sleeping for what Remus Loffler was prescribed, but is not working.  She is still having pain on her knees, especially left one and is asking for knee injection.  BP is well controlled and she denies CP, SOB, fever, chills, abdominal pain, nausea, vomiting or any other complaints.    Patient will has sleep study done on July 06, 2009.  Depression History:      The patient denies a depressed mood most of the day and a diminished interest in her usual daily activities.        The patient denies that she feels  like life is not worth living, denies that she wishes that she were dead, and denies that she has thought about ending her life.         Preventive Screening-Counseling & Management  Alcohol-Tobacco     Smoking Status: quit     Year Quit: 2008     Passive Smoke Exposure: no  Caffeine-Diet-Exercise     Does Patient Exercise: yes     Type of exercise: WALKING  Problems Prior to Update: 1)  Extrinsic Asthma, Unspecified  (ICD-493.00) 2)  Routine Gynecological Examination  (ICD-V72.31) 3)  Arm Pain, Left  (ICD-729.5) 4)  Chest Pain  (ICD-786.50) 5)  Gastroesophageal Reflux Disease, Chronic  (ICD-530.81) 6)  Knee Pain, Left  (ICD-719.46) 7)  Baker's Cyst, Left Knee  (ICD-727.51) 8)  Back Pain, Chronic, Intermittent  (ICD-724.5) 9)  Dental Pain  (ICD-525.9) 10)  Leg Edema, Left  (ICD-782.3) 11)  Hemorrhoids  (ICD-455.6) 12)  Diarrhea  (ICD-787.91) 13)  Headache  (ICD-784.0) 14)  Acanthosis Nigricans  (ICD-701.2) 15)  Foot Pain, Right  (ICD-729.5) 16)  Menorrhagia  (ICD-626.2) 17)  Asthma, Extrinsic W/acute Exacerbation  (ICD-493.02) 18)  Rhinitis  (ICD-472.0) 19)  Hypertension, Essential Nos  (ICD-401.9) 20)  Dental Pain  (ICD-525.9) 21)  Insomnia  (ICD-780.52) 22)  Obesity, Morbid  (ICD-278.01) 23)  Abdominal Pain  (ICD-789.00) 24)  Helicobacter Pylori  Infection  (ICD-041.86) 25)  Sleep Apnea  (ICD-780.57) 26)  COPD  (ICD-496) 27)  Asthma  (ICD-493.90) 28)  Depression  (ICD-311) 29)  Gerd  (ICD-530.81) 30)  Wrist Pain, Right  (ICD-719.43) 31)  Tobacco User  (ICD-305.1) 32)  Tubal Ligation, Hx of  (ICD-V26.51)  Current Problems (verified): 1)  Extrinsic Asthma, Unspecified  (ICD-493.00) 2)  Routine Gynecological Examination  (ICD-V72.31) 3)  Arm Pain, Left  (ICD-729.5) 4)  Chest Pain  (ICD-786.50) 5)  Gastroesophageal Reflux Disease, Chronic  (ICD-530.81) 6)  Knee Pain, Left  (ICD-719.46) 7)  Baker's Cyst, Left Knee  (ICD-727.51) 8)  Back Pain, Chronic, Intermittent   (ICD-724.5) 9)  Dental Pain  (ICD-525.9) 10)  Leg Edema, Left  (ICD-782.3) 11)  Hemorrhoids  (ICD-455.6) 12)  Diarrhea  (ICD-787.91) 13)  Headache  (ICD-784.0) 14)  Acanthosis Nigricans  (ICD-701.2) 15)  Foot Pain, Right  (ICD-729.5) 16)  Menorrhagia  (ICD-626.2) 17)  Asthma, Extrinsic W/acute Exacerbation  (ICD-493.02) 18)  Rhinitis  (ICD-472.0) 19)  Hypertension, Essential Nos  (ICD-401.9) 20)  Dental Pain  (ICD-525.9) 21)  Insomnia  (ICD-780.52) 22)  Obesity, Morbid  (ICD-278.01) 23)  Abdominal Pain  (ICD-789.00) 24)  Helicobacter Pylori Infection  (ICD-041.86) 25)  Sleep Apnea  (ICD-780.57) 26)  COPD  (ICD-496) 27)  Asthma  (ICD-493.90) 28)  Depression  (ICD-311) 29)  Gerd  (ICD-530.81) 30)  Wrist Pain, Right  (ICD-719.43) 31)  Tobacco User  (ICD-305.1) 32)  Tubal Ligation, Hx of  (ICD-V26.51)  Medications Prior to Update: 1)  Omeprazole 40 Mg Cpdr (Omeprazole) .[name].. 1 By Mouth Two Times A Day 2)  Proair Hfa 108 (90 Base) Mcg/act Aers (Albuterol Sulfate) .... 2 Puffs Four Times A Day As Needed 3)  Hydrochlorothiazide 25 Mg Tabs (Hydrochlorothiazide) .... One Tablet By Mouth Daily 4)  Flonase 50 Mcg/act Susp (Fluticasone Propionate) .... Take 2 Sprays in Each Nostril Daily For 3 Days, Then One Spray in Each Nostil Daily For 2 Weeks 5)  Claritin 10 Mg Tabs (Loratadine) .... Take 1 Tablet By Mouth Once A Day 6)  Tylenol 325 Mg Tabs (Acetaminophen) .... Take 1-2 Tabs By Mouth Up To Every 6 Hours As Needed For Pain. 7)  Advil 200 Mg Caps (Ibuprofen) .... Take 1 Tab By Mouth  Up To Every 6 Hours As Needed For Pain 8)  Albuterol Sulfate (2.5 Mg/27ml) 0.083% Nebu (Albuterol Sulfate) .[name].. 1 Neb Four Times A Day As Needed 9)  Ambien 5 Mg Tabs (Zolpidem Tartrate) .... Take 1 Tablet By Mouth At Bedtime 10)  Atrovent Hfa 17 Mcg/act Aers (Ipratropium Bromide Hfa) .... 2 Puffs . Qid As Needed Rescue 11)  Dulera 200-5 Mcg/act Aero (Mometasone Furo-Formoterol Fum) .... Use Two Times A Day For  Asthma. 12)  Lisinopril 20 Mg Tabs (Lisinopril) .... Take One Tablet Daily For Blood Pressure. 13)  Epipen 0.3 Mg/0.28ml Devi (Epinephrine) .... Use As Directed As Needed Severe Allergic Reaction  Current Medications (verified): 1)  Omeprazole 40 Mg Cpdr (Omeprazole) .[name].. 1 By Mouth Two Times A Day 2)  Proair Hfa 108 (90 Base) Mcg/act Aers (Albuterol Sulfate) .... 2 Puffs Four Times A Day As Needed 3)  Hydrochlorothiazide 25 Mg Tabs (Hydrochlorothiazide) .... One Tablet By Mouth Daily 4)  Flonase 50 Mcg/act Susp (Fluticasone Propionate) .... Take 2 Sprays in Each Nostril Daily For 3 Days, Then One Spray in Each Nostil Daily For 2 Weeks 5)  Claritin 10 Mg Tabs (Loratadine) .... Take 1 Tablet By Mouth Once A Day 6)  Albuterol Sulfate (2.5 Mg/18ml) 0.083% Nebu (Albuterol Sulfate) .[name].. 1 Neb Four Times A Day As Needed 7)  Ambien 5 Mg Tabs (Zolpidem Tartrate) .... Take 1 Tablet By Mouth At Bedtime 8)  Atrovent Hfa 17 Mcg/act Aers (Ipratropium Bromide Hfa) .... 2 Puffs . Qid As Needed Rescue 9)  Dulera 200-5 Mcg/act Aero (Mometasone Furo-Formoterol Fum) .... Use Two Times A Day For Asthma. 10)  Lisinopril 20 Mg Tabs (Lisinopril) .... Take One Tablet Daily For Blood Pressure. 11)  Epipen 0.3 Mg/0.3ml Devi (Epinephrine) .... Use As Directed As Needed Severe Allergic Reaction  Allergies (verified): No Known Drug Allergies  Past History:  Past Medical History: Last updated: 02/01/2009 ACANTHOSIS NIGRICANS (ICD-701.2) FOOT PAIN, RIGHT (ICD-729.5) MENORRHAGIA (ICD-626.2) ASTHMA, EXTRINSIC W/ACUTE EXACERBATION (ICD-493.02) HYPERTENSION, ESSENTIAL NOS (ICD-401.9) DENTAL PAIN (ICD-525.9) INSOMNIA (ICD-780.52) OBESITY, MORBID (ICD-278.01) ABDOMINAL PAIN (ICD-789.00) HELICOBACTER PYLORI INFECTION (ICD-041.86) SLEEP APNEA (ICD-780.57) COPD (ICD-496) ASTHMA (ICD-493.90)- Skin test 02/01/09 Pos Hemorrhoids. DEPRESSION (ICD-311) GERD (ICD-530.81) WRIST PAIN, RIGHT (ICD-719.43) TOBACCO USER  (ICD-305.1) TUBAL LIGATION, HX OF (ICD-V26.51)  Past Surgical History: Last updated: 04/10/2006 Tubal ligation, bilateral, 1996  Family History: Last updated: 12/16/2008 daughter, grandmother, neice- asthma Aunt died- lung cancer  Social History: Last updated: 12/16/2008 Daughter in [employer] Not married Former smoker. 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  Risk Factors: Exercise: yes (06/23/2009)  Risk Factors: Smoking Status: quit (06/23/2009) Passive Smoke Exposure: no (06/23/2009)  Review of Systems       as per HPI.  Physical Exam  General:  alert and overweight-appearing.   Lungs:  normal respiratory effort and normal breath sounds.  no wheesing. Heart:  normal rate, regular rhythm, and no murmur.   Abdomen:  soft, non-tender, and normal bowel sounds.   Msk:  no joint swelling, no joint warmth, and no redness over joints.  Mild crepitation with movement of her left knee. Extremities:  no edema Neurologic:  alert & oriented X3, cranial nerves II-XII intact, strength normal in all extremities, and gait normal.     Impression & Recommendations:  Problem # 1:  GASTROESOPHAGEAL REFLUX DISEASE, CHRONIC (ICD-530.81) Patient reflux is well controlled and stable. Will refill her omeprazole and will continue current regimen of 1 tablet two times a day. Will refresh lifestyle modification intructions.   Her updated medication list for this problem includes:    Omeprazole 40 Mg Cpdr (Omeprazole) .[name]... 1 by mouth two times a day  Problem # 2:  EXTRINSIC ASTHMA, UNSPECIFIED (ICD-493.00) Patient had a recent exacerbation and was treated by her pulmunolgist. Will continue albuterol as needed as rescue inhaler and dulera  maintenance therapy. Patient also had a nebulizer machine at home and prescription for albuterol and atrovent solution in order to use them when she  is having exacerbations.  Her updated medication list for this problem includes:    Proair Hfa 108 (90 Base) Mcg/act Aers (Albuterol sulfate) .[name]... 2 puffs four times a day as needed    Albuterol Sulfate (2.5 Mg/76ml) 0.083% Nebu (Albuterol sulfate) .[name]... 1 neb four times a day as needed    Atrovent Hfa 17 Mcg/act Aers (Ipratropium bromide hfa) .[name]... 2 puffs . qid as needed rescue    Dulera 200-5 Mcg/act Aero (Mometasone furo-formoterol fum) ..... Use two times a day for asthma.  Problem # 3:  KNEE PAIN, LEFT (ICD-719.46) Patient with chronic OA of her knee. Will provide vicodin for  pain control and also voltaren gel. Patient has been advised to lose wieght and to performed low impact exercise.   The following medications were removed from the medication list:    Tylenol 325 Mg Tabs (Acetaminophen) .[name]... Take 1-2 tabs by mouth up to every 6 hours as needed for pain.    Advil 200 Mg Caps (Ibuprofen) .[name]... Take 1 tab by mouth  up to every 6 hours as needed for pain Her updated medication list for this problem includes:    Vicodin Es 7.5-750 Mg Tabs (Hydrocodone-acetaminophen) .[name]... Ta ke 1 tablet by mouth every 8 hours as needed for pain. (do not use when working)  Problem # 4:  HYPERTENSION, ESSENTIAL NOS (ICD-401.9) Well controlled and at goal. Will continue current regimen and will advised patient to follow a low sodium diet.  Her updated medication list for this problem includes:    Hydrochlorothiazide 25 Mg Tabs (Hydrochlorothiazide) ..... One tablet by mouth daily    Lisinopril 20 Mg Tabs (Lisinopril) .[name]... Take one tablet daily for blood pressure.  BP today: 140/83 Prior BP: 126/74 (06/13/2009)  Labs Reviewed: K+: 4.3 (04/17/2009) Creat: : 0.61 (04/17/2009)   Chol: 179 (04/17/2009)   HDL: 63 (04/17/2009)   LDL: 93 (04/17/2009)   TG: 114 (04/17/2009)  Problem # 5:  INSOMNIA (ICD-780.52) Patient having difficulty sleeping and with no relief coming form his Ambien. Patient will be  started on restoril and has been advised to follow a good sleep hygiene.   Her updated medication list for this problem includes:    Restoril 7.5 Mg Caps (Temazepam) .[name]... Take 1 tab by mouth at bedtime  Complete Medication List: 1)  Omeprazole 40 Mg Cpdr (Omeprazole) .[name].. 1 by mouth two times a day 2)  Proair Hfa 108 (90 Base) Mcg/act Aers (Albuterol sulfate) .... 2 puffs four times a day as needed 3)  Hydrochlorothiazide 25 Mg Tabs (Hydrochlorothiazide) .... One tablet by mouth daily 4)  Flonase 50 Mcg/act Susp (Fluticasone propionate) .... Take 2 sprays in each nostril daily for 3 days, then one spray in each nostil daily for 2 weeks 5)  Claritin 10 Mg Tabs (Loratadine) .... Take 1 tablet by mouth once a day 6)  Albuterol Sulfate (2.5 Mg/74ml) 0.083% Nebu (Albuterol sulfate) .[name].. 1 neb four times a day as needed 7)  Restoril 7.5 Mg Caps (Temazepam) .... Take 1 tab by mouth at bedtime 8)  Atrovent Hfa 17 Mcg/act Aers (Ipratropium bromide hfa) .... 2 puffs . qid as needed rescue 9)  Dulera 200-5 Mcg/act Aero (Mometasone furo-formoterol fum) .... Use two times a day for asthma. 10)  Lisinopril 20 Mg Tabs (Lisinopril) .... Take one tablet daily for blood pressure. 11)  Epipen 0.3 Mg/0.40ml Devi (Epinephrine) .... Use as directed as needed severe allergic reaction 12)  Voltaren 1 % Gel (Diclofenac sodium) .... Apply on your knee every 6 hours as needed for pain 13)  Vicodin Es 7.5-750 Mg Tabs (Hydrocodone-acetaminophen) .... Ta ke 1 tablet by mouth every 8 hours as needed for pain. (do not use when working)  Patient Instructions: 1)  Please schedule a follow-up appointment in 4 months. 2)  Your blood pressure is great today, continue taking your medications and follow a low sodium diet. 3)  Please keep your appointment with Dr. Maple Hudson. 4)  You need to lose weight. Consider a lower calorie diet and regular exercise.  5)  Take your medications as prescribed Prescriptions: VICODIN ES 7.5-750 MG  TABS (HYDROCODONE-ACETAMINOPHEN) Ta ke 1 tablet by  mouth every 8 hours as needed for pain. (do not use when working)  #45 x 0   Entered and Authorized by:   Vassie Loll MD   Signed by:   Vassie Loll MD on 06/23/2009   Method used:   Print then Give to Patient   RxID:   [id number] RESTORIL 7.5 MG CAPS (TEMAZEPAM) Take 1 tab by mouth at bedtime  #30 x 0   Entered and Authorized by:   Vassie Loll MD   Signed by:   Vassie Loll MD on 06/23/2009   Method used:   Print then Give to Patient   RxID:   [id number] VOLTAREN 1 % GEL (DICLOFENAC SODIUM) Apply on your knee every 6 hours as needed for pain  #3 x 3   Entered and Authorized by:   Vassie Loll MD   Signed by:   Vassie Loll MD on 06/23/2009   Method used:   Electronically to        [facility] [phone]* (retail)       [address]       [place], [place]  [address]       Ph: [phone]       Fax: [phone]   RxID:   [id number]   Prevention & Chronic Care Immunizations   Influenza vaccine: Fluvax 3+  (03/03/2009)    Tetanus booster: Not documented    Pneumococcal vaccine: Pneumovax  (03/17/2009)  Other Screening   Pap smear: Not documented   Pap smear action/deferral: Deferred  (04/17/2009)   Smoking status: quit  (06/23/2009)  Lipids   Total Cholesterol: 179  (04/17/2009)   Lipid panel action/deferral: Lipid Panel ordered   LDL: 93  (04/17/2009)   LDL Direct: Not documented   HDL: 63  (04/17/2009)   Triglycerides: 114  (04/17/2009)  Hypertension   Last Blood Pressure: 140 / 83  (06/23/2009)   Serum creatinine: 0.61  (04/17/2009)   Serum potassium 4.3  (04/17/2009)  Self-Management Support :   Personal Goals (by the next clinic visit) :      Personal blood pressure goal: 140/90  (01/18/2009)   Patient will work on the following items until the next clinic visit to reach self-care goals:     Medications and monitoring: take my medicines every day  (06/23/2009)     Eating: drink  diet soda or water instead of juice or soda, eat more vegetables, use fresh or frozen vegetables, eat foods that are low in salt, eat baked foods instead of fried foods, eat fruit for snacks and desserts, limit or avoid alcohol  (06/23/2009)     Activity: take a 30 minute walk every day  (06/23/2009)    Hypertension self-management support: Education handout, Written self-care plan, Resources for patients handout  (06/23/2009)   Hypertension self-care plan printed.   Hypertension education handout printed      Resource handout printed.

## 2010-05-29 NOTE — Assessment & Plan Note (Signed)
Summary: xolair/apc  Nurse Visit   Allergies: 1)  ! * Ace Inhibitors/ Lisinopril  Medication Administration  Injection # 1:    Medication: Xolair (omalizumab) 150mg     Diagnosis: EXTRINSIC ASTHMA, UNSPECIFIED (ICD-493.00)    Route: IM    Site: R deltoid    Exp Date: 11/27/2012    Lot #: [id number]    Mfr: Genetech    Comments: xolair 225 mg = 60 units, 0.9 ml x1 in right deltoid and 0.9 ml in left deltoid. Pt waited 20 mins.    Given by: Clarise Cruz Duncan Dull)  Orders Added: 1)  Administration xolair injection [phone]   Medication Administration  Injection # 1:    Medication: Xolair (omalizumab) 150mg     Diagnosis: EXTRINSIC ASTHMA, UNSPECIFIED (ICD-493.00)    Route: IM    Site: R deltoid    Exp Date: 11/27/2012    Lot #: [id number]    Mfr: [employer]etech    Comments: xolair 225 mg = 60 units, 0.9 ml x1 in right deltoid and 0.9 ml in left deltoid. Pt waited 20 mins.    Given by: Clarise Cruz Duncan Dull)  Orders Added: 1)  Administration xolair injection [phone]

## 2010-05-29 NOTE — Letter (Signed)
Summary: Out of Work  [employer]  [name]   [place], [place] [address]   Phone: [phone]  Fax: [phone]    November 07, 2009   Employee:  April [name]    To Whom It May Concern:   For Medical reasons, please excuse the above named employee from work for the following dates:  Start:   Sunday 11/05/09  End:   Monday 11/06/09  Patient to return 11/07/09  If you need additional information, please feel free to contact our office at [phone]         Sincerely,       Dr. Jetty Duhamel

## 2010-05-29 NOTE — Progress Notes (Signed)
Summary: asthma     Phone Note Call from Patient   Caller: Patient Call For: young Summary of Call: pt want to talk to [facility] about her asthma. offered appointed but she refused just want to talk to [facility] Initial call taken by: Rickard Patience,  February 08, 2010 1:21 PM  Follow-up for Phone Call        [facility].  Aundra Millet Reynolds LPN  February 08, 2010 1:53 PM  Spoke to patient;states her job is trying to fire her for leaving work on 01-17-10-she needed to go home to take nebulizer tx; pt was told by myself that such note could not and would not be given as we did not see her for on this day for acute visit-we did supply a letter stating patient could take and use her nebulizer machine while at work when needed. Pt stated she needed this letter to keep her job even though she has FMLA in place to leave work when needed. I told patient we would not give her the letter; she thanked me and hung up the phone.Reynaldo Minium CMA  February 08, 2010 2:13 PM

## 2010-05-29 NOTE — Letter (Signed)
Summary: Out of Work  [employer]  [name]   [place], [place] [address]   Phone: [phone]  Fax: [phone]    May 11, 2009   Employee:  PINKIE MANGER    To Whom It May Concern:   For Medical reasons, please excuse the above named employee from work for the following dates:  Start:   05-11-2009  End:   05-11-2009  Pt is able to return to work on 05-12-09.  If you need additional information, please feel free to contact our office.         Sincerely,      Jason Coop

## 2010-05-29 NOTE — Assessment & Plan Note (Signed)
Summary: Soc. Work   Social Work Evaluation Date  10/06/2009 Patient name April [name]  Primary MD   : Valetta Close MD Social Worker's name : Dorothe Pea MSW- LCSW  Home [phone]  Work phone: [phone]  Cell phone: .  [name]     Alternate phone: . [name]       Individual making referral: Deb Ditzler  Primary Reason for Referral:     Financial Crisis/Counseling and referral for food,clothes,meals,emergency funds Comments Patient requesting food assist.  Works but has two children who are now out of school.  Action taken by Social Work: Referral letter given so patient can access [employer].  She has all identification items that they need and plans to go today.   Pt. given items from our food pantry today.

## 2010-05-29 NOTE — Assessment & Plan Note (Signed)
**Note De-Identified  Obfuscation** Summary: xolair///kp  Nurse Visit   Allergies: 1)  ! * Ace Inhibitors/ Lisinopril  Medication Administration  Injection # 1:    Medication: Xolair (omalizumab) 150mg     Diagnosis: 493.00    Route: SQ    Site: L deltoid    Exp Date: 01/2013    Mfr: Genetech    Comments: pt got 225mg  in left and right arm    Given by: alida rogers in allergy lab  Orders Added: 1)  Administration xolair injection [[id number]]   Medication Administration  Injection # 1:    Medication: Xolair (omalizumab) 150mg     Diagnosis: 493.00    Route: SQ    Site: L deltoid    Exp Date: 01/2013    Mfr: [employer]et[employer]    Comments: pt got 225mg  in left and right arm    Given by: alida rogers in allergy lab  Orders Added: 1)  Administration xolair injection [phone]

## 2010-05-29 NOTE — Progress Notes (Signed)
Summary: refill/gg  Phone Note Refill Request  on June 13, 2009 1:35 PM  Refills Requested: Medication #1:  OMEPRAZOLE 40 MG CPDR 1 by mouth two times a day  Medication #2:  AMBIEN 5 MG TABS Take 1 tablet by mouth at bedtime  Method Requested: Electronic Initial call taken by: Merrie Roof RN,  June 13, 2009 1:36 PM  Follow-up for Phone Call        Refill approved-nurse to complete    Prescriptions: AMBIEN 5 MG TABS (ZOLPIDEM TARTRATE) Take 1 tablet by mouth at bedtime  #30 x 2   Entered and Authorized by:   Vassie Loll MD   Signed by:   Vassie Loll MD on 06/13/2009   Method used:   Telephoned to ...       [facility] [address] [phone]* (retail)       [address]       [place], [place]  [address]       Ph: [phone]       Fax: [phone]   RxID:   [id number] OMEPRAZOLE 40 MG CPDR (OMEPRAZOLE) 1 by mouth two times a day  #62 x 5   Entered and Authorized by:   Vassie Loll MD   Signed by:   Vassie Loll MD on 06/13/2009   Method used:   Electronically to        [facility] [phone]* (retail)       [address]       [place], [place]  [address]       Ph: [phone]       Fax: [phone]   RxID:   [id number]

## 2010-05-29 NOTE — Assessment & Plan Note (Signed)
Summary: April [name]   Copy to:  [facility] Primary Provider/Referring Provider:  Valetta Close MD  CC:  Post Hospital-asthma flare up per pt..  History of Present Illness: November 07, 2009--Presents for an acute office visit. Complains of increased SOB, wheezing, dry cough, tightness/scratchiness in throat x3days. Lots of nasal congestion, stuffiness, post nasal drip. Complains  of recurrent dry cough, tickle in throat, wheezing w/ tightness. Worse for last 3 days. Was seen in ER couple of months ago w/ similar symptoms. CXR in March of this year was w/ no acute findings. Denies chest pain,  orthopnea, hemoptysis, fever, n/v/d, edema, headache.   November 24, 2009- Asthma, rhinitis She was seen here for acute flare of asthma, given prednisone taper and switched from Lisinopril to Diovan last visit.  She said at first that she felt  better in her throat with this change. She is taking Herbalife products trying to lose weight. She also feels Xolair continues to "do great" . Today her breathing is good. She says in the current hot, "orange" air qulaity she is using her rescue inhaler more than once daily. 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 As we talk, she begins to complain that throat is itching and demonstrates dry cough.  January 31, 2010- Asthma / Xolair, rhinitis [facility] Hospital-asthma flare up per pt. She made several trips to ER about infected felon on her right index finger and was given doxycycline then bactrim with surgical drainage. She says for 2 weeks she has been having more trouble with asthma and nasal congestion, but doesn't think it is a cold.  Xolair has continued to help and she observes she hasn't needed to be hosp for her asthma. Today she feels watery nose and little wheeze- not bad. Taking prednisone 3 daily x 5 days. She has her meds. Had flu vax.     Asthma History    Asthma Control Assessment:    Age range: 12+ years    Symptoms: 0-2 days/week  Nighttime Awakenings: 0-2/month    Interferes w/ normal activity: no limitations    SABA use (not for EIB): >2 days/week    FEV1 Pred: 3.38 liters (today)    Asthma Control Assessment: Not Well Controlled   Preventive Screening-Counseling & Management  Alcohol-Tobacco     Smoking Status: quit     Year Quit: 2008     Passive Smoke Exposure: no  Current Medications (verified): 1)  Diovan 160 Mg Tabs (Valsartan) .[name].. 1 By Mouth Once Daily 2)  Omeprazole 40 Mg Cpdr (Omeprazole) .[name].. 1 By Mouth Two Times A Day 3)  Hydrochlorothiazide 25 Mg Tabs (Hydrochlorothiazide) .... One Tablet By Mouth Daily 4)  Flonase 50 Mcg/act Susp (Fluticasone Propionate) .... Take 2 Sprays in Each Nostril Daily For 3 Days, Then One Spray in Each Nostil Daily For 2 Weeks 5)  Claritin 10 Mg Tabs (Loratadine) .... Take 1 Tablet By Mouth Once A Day 6)  Albuterol Sulfate (2.5 Mg/3ml) 0.083% Nebu (Albuterol Sulfate) .[name].. 1 Neb Four Times A Day As Needed 7)  Atrovent Hfa 17 Mcg/act Aers (Ipratropium Bromide Hfa) .... 2 Puffs . Qid As Needed Rescue 8)  Dulera 200-5 Mcg/act Aero (Mometasone Furo-Formoterol Fum) .... Use Two Times A Day For Asthma. 9)  Epipen 0.3 Mg/0.81ml Devi (Epinephrine) .... Use As Directed As Needed Severe Allergic Reaction 10)  Xolair 150 Mg Solr (Omalizumab) .... 300mg  Every 2 Weeks 11)  Ambien 10 Mg Tabs (Zolpidem Tartrate) .... Take 1 By Mouth At  Bedtime As Needed To Fall Sleep. 12)  Proair Hfa 108 (90 Base) Mcg/act Aers (Albuterol Sulfate) .... 2 Puffs Four Times A Day As Needed 13)  Ketorolac Tromethamine 10 Mg Tabs (Ketorolac Tromethamine) .... Take 1 Tablet By Mouth Every 6 Hours As Needed For Severe Pain. Take With Food.  Allergies (verified): 1)  ! * Ace Inhibitors/ Lisinopril  Past History:  Past Medical History: Last updated: 02/01/2009 ACANTHOSIS NIGRICANS (ICD-701.2) FOOT PAIN, RIGHT (ICD-729.5) MENORRHAGIA (ICD-626.2) ASTHMA, EXTRINSIC W/ACUTE EXACERBATION  (ICD-493.02) HYPERTENSION, ESSENTIAL NOS (ICD-401.9) DENTAL PAIN (ICD-525.9) INSOMNIA (ICD-780.52) OBESITY, MORBID (ICD-278.01) ABDOMINAL PAIN (ICD-789.00) HELICOBACTER PYLORI INFECTION (ICD-041.86) SLEEP APNEA (ICD-780.57) COPD (ICD-496) ASTHMA (ICD-493.90)- Skin test 02/01/09 Pos Hemorrhoids. DEPRESSION (ICD-311) GERD (ICD-530.81) WRIST PAIN, RIGHT (ICD-719.43) TOBACCO USER (ICD-305.1) TUBAL LIGATION, HX OF (ICD-V26.51)  Past Surgical History: Last updated: 04/10/2006 Tubal ligation, bilateral, 1996  Family History: Last updated: 12/16/2008 daughter, grandmother, neice- asthma Aunt died- lung cancer  Social History: Last updated: 12/16/2008 Daughter in [employer] Not married Former smoker. 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  Risk Factors: Exercise: yes (12/22/2009)  Risk Factors: Smoking Status: quit (01/31/2010) Passive Smoke Exposure: no (01/31/2010)  Review of Systems      See HPI       The patient complains of shortness of breath with activity, shortness of breath at rest, and non-productive cough.  The patient denies productive cough, coughing up blood, chest pain, irregular heartbeats, acid heartburn, indigestion, loss of appetite, weight change, abdominal pain, difficulty swallowing, sore throat, tooth/dental problems, headaches, nasal congestion/difficulty breathing through nose, sneezing, itching, ear ache, hand/feet swelling, rash, change in color of mucus, and fever.         watery sniffing  Vital Signs:  Patient profile:   40 year old female Height:      65 inches Weight:      329.38 pounds BMI:     55.01 O2 Sat:      99 % on Room air Pulse rate:   73 / minute BP sitting:   122 / 80  (left arm) Cuff size:   large  Vitals Entered By: Reynaldo Minium CMA (January 31, 2010 11:58 AM)  O2 Flow:  Room air CC: Post Hospital-asthma flare up  per pt.   Physical Exam  Additional Exam:  General: A/Ox3; pleasant and cooperative, NAD, very obese, resting room air sat 99% SKIN: no rash, lesions.  NODES: no lymphadenopathy HEENT: [place]/AT, EOM- WNL , TM-WNL, Nose- wet sniffing, Throat- clear and wnl, , Mallampati IV, multi dental caries, no drainage NECK: Supple w/ fair ROM, JVD- none, normal carotid impulses w/o bruits Thyroid-nonpalpable  CHEST: clear with no wheeze, no stridor noted . Poor airflow c/w obesity hypoventilation HEART: RRR, no m/g/r heard ABDOMEN: Soft and nl; ZOX:WRUE, nl pulses, no edema  NEURO: Grossly intact to observation      Pre-Spirometry FEV1    Pred: 3.38 L     Impression & Recommendations:  Problem # 1:  EXTRINSIC ASTHMA, UNSPECIFIED (ICD-493.00) Recently a little tighter related to the unstable weather. This has been addressed at the ER with a prednisone burst. She can continue this., We discussed Dulera, comparing with Advair. She will stick with Dulera now.  Dosciussed steroids and weight gain. She is out of work due to her infected finger and will RTW as per orthopedics.   Problem # 2:  RHINITIS (ICD-472.0) Fair  control.  Other Orders: Est. Patient Level III (04540) Administration xolair injection [phone])  Patient Instructions: 1)  Please schedule a follow-up appointment in 3 months. 2)  Continue the prednisone taper and your regular meds.      Medication Administration  Injection # 1:    Medication: Xolair (omalizumab) 150mg     Diagnosis: EXTRINSIC ASTHMA, UNSPECIFIED (ICD-493.00)    Route: IM    Site: R deltoid    Exp Date: 01/27/2013    Lot #: [id number]    Mfr: Genetech    Comments: xolair 225, units 60, 0.9 ml x 1 in right deltoid, 0.9 ml in left deltoid. pt waited 10 mins.    Given by: Dimas Millin, Allergy tech.  Orders Added: 1)  Est. Patient Level III [56213] 2)  Administration xolair injection [phone]

## 2010-05-29 NOTE — Letter (Signed)
Summary: [facility] APPT.  [facility] /[facility]PT.   Imported By: Margie Billet 03/30/2010 15:58:12  _____________________________________________________________________  External Attachment:    Type:   Image     Comment:   External Document

## 2010-05-29 NOTE — Progress Notes (Signed)
Summary: rx request  Phone Note Call from Patient   Caller: Patient Call For: young Summary of Call: pt requests rx for [facility]- [facility] [address] pt # [id number] Initial call taken by: Tivis Ringer, CNA,  December 22, 2009 12:48 PM  Follow-up for Phone Call        Rx was last printed and given to pt on 2.15.11 #1 x as needed.  Called, spoke with pt.  Pt states she never got the rx filled bc she has been using samples.  She has ran out of samples and lost the rx -- requesting rx to be sent to [facility] [address]  Rx sent - pt aware.  Follow-up by: Gweneth Dimitri RN,  December 22, 2009 2:42 PM    Prescriptions: FLONASE 50 MCG/ACT SUSP (FLUTICASONE PROPIONATE) Take 2 sprays in each nostril daily for 3 days, then one spray in each nostil daily for 2 weeks  #1 x prn   Entered by:   Gweneth Dimitri RN   Authorized by:   [name] MD   Signed by:   Gweneth Dimitri RN on 12/22/2009   Method used:   Electronically to        [facility] [phone]* (retail)       [address]       [place], [place]  [address]       Ph: [phone]       Fax: [phone]   RxID:   [id number]

## 2010-05-29 NOTE — Assessment & Plan Note (Signed)
Summary: XOLAIR/KLW  Nurse Visit   Allergies: No Known Drug Allergies  Medication Administration  Injection # 1:    Medication: Xolair (omalizumab) 150mg     Diagnosis: EXTRINSIC ASTHMA, UNSPECIFIED (ICD-493.00)    Route: SQ    Site: R deltoid    Exp Date: 06/2012    Lot #: [id number]    Mfr: Mendel Ryder    Comments: Injection given by Clarise Cruz in allergy lab. Xolair 225mg . 0.46ml x 1 in Right and Left Deltoid. Pt waited 10 minutes.     Patient tolerated injection without complications  Orders Added: 1)  Admin of patients own med IM/SQ [[id number]]

## 2010-05-29 NOTE — Progress Notes (Signed)
Summary: note request  Phone Note Call from Patient Call back at Home Phone [phone]   Caller: Patient Call For: [name] Summary of Call: pt missed work today because of asthma. wants dr young to write her an "excuse", even though dr young didn't see pt for this. pt says this is because of her FMLA.  Initial call taken by: Tivis Ringer, CNA,  November 06, 2009 11:01 AM  Follow-up for Phone Call        pt states she left work early yesterday and missed today due to asthma flare up, pt states her symptoms were sob, cough-prod-clear, chest was tight but better now, pt states she is feeling better but needs a work note to cover her for sunday laving early and today out all day--pt does not feel she needs ov--pls advise if you want to do a note for her? Follow-up by: Philipp Deputy CMA,  November 06, 2009 2:05 PM  Additional Follow-up for Phone Call Additional follow up Details #1::        ok for work note per dr young note typed printed and faxed to pt's work per her request Additional Follow-up by: Philipp Deputy CMA,  November 07, 2009 9:11 AM

## 2010-05-29 NOTE — Assessment & Plan Note (Signed)
Summary: xolair/klw  Nurse Visit   Allergies: No Known Drug Allergies  Medication Administration  Injection # 1:    Medication: Xolair (omalizumab) 150mg     Diagnosis: EXTRINSIC ASTHMA, UNSPECIFIED (ICD-493.00)    Route: SQ    Site: R deltoid    Exp Date: 08/2012    Lot #: [id number]    Mfr: Mendel Ryder    Comments: Injection given by Dimas Millin in allergy lab. Xolair 225mg . 0.82ml x 1 in Right and Left Deltoid. Pt did not wait.   Orders Added: 1)  Admin of patients own med IM/SQ [[id number]]

## 2010-05-29 NOTE — Letter (Signed)
Summary: Out of Work  [employer]  [name]   [place], [place] [address]   Phone: [phone]  Fax: [phone]    May 03, 2009   Employee:  April [name]    To Whom It May Concern:   For Medical reasons, please excuse the above named employee from work for the following dates:  Start:   05/03/2009  End:   05/03/2009  If you need additional information, please feel free to contact our office.         Sincerely,    Jetty Duhamel, MD

## 2010-05-29 NOTE — Assessment & Plan Note (Signed)
Summary: xolair/klw  Nurse Visit   Allergies: No Known Drug Allergies  Medication Administration  Injection # 1:    Medication: Xolair (omalizumab) 150mg     Diagnosis: EXTRINSIC ASTHMA, UNSPECIFIED (ICD-493.00)    Route: SQ    Site: L deltoid    Exp Date: 08/2012    Lot #: [id number]    Mfr: Mendel Ryder    Comments: Injection given by Dimas Millin in allergy lab. Xolair 225mg . 0.41ml x 1 in Left and Right Deltoid. Pt waited 10 minutes.  Orders Added: 1)  Admin of patients own med IM/SQ [[id number]]

## 2010-05-29 NOTE — Assessment & Plan Note (Signed)
Summary: asthma flare/apc   Copy to:  [facility] Primary Provider/Referring Provider:  Valetta Close MD  CC:  Asthma flare up-wheexing/SOB;cough-clear, sneezing, and stopped up.[name]  History of Present Illness: March 03, 2009- Asthma, rhinitis Significant financial limitation. Doesn't qualify for [facility] because she has some insurance, but she feels she needs asssitance and has been out of meds. Trying to exercise. She came having scheduled another appointment at 2:15PM. Declines flu vax.  March 17, 2009- Asthma, rhintis She had no problem with flu vax. Now has questions as she considers the pneumovax.  She asks about repeating remote sleep study. She is aware she snores loudly and can be tired in day but works 12 hours. A previous sleep study 2 years ago - need to review record. Asthma has not been worse. Spiriva is n't helping. She still needs rescue inhaler about 5-6 x/day. She says she is not smoking  April 18, 2009- Asthma, rhinitis Wakes up with heartburn and wheezing. She is on omeprazole twice daily before meals. At home she needs to use her Atrovent more than 4 times daily. We reviewed allergy records- IgE 139.4 on 12/16/08. We spent 20 minutes today discussing Xolair, risks, benefits, goals and guidelines.  June 13, 2009- Asthma, rhintis Asthma flare yestrerday- unknown trigger at work. Stayed at work. She has noted some shoulder pain on left side, above where she gets Xolair inj. She gets Xolair inj in both arms each time and doesn't notice this pain in the right arm. She thinks Xolair may be helping asthma on balance, but it is eatrly still. I again explained Xolair would only help allergic asthma- not viruses, stress, reflux or irritant exposures. She still has Visual merchandiser. She asks about continuing Nasonex and we discused antihistamines. Plans to stay out of work today.   Current Medications (verified): 1)  Omeprazole 40 Mg Cpdr (Omeprazole)  .[name].. 1 By Mouth Two Times A Day 2)  Proair Hfa 108 (90 Base) Mcg/act Aers (Albuterol Sulfate) .... 2 Puffs Four Times A Day As Needed 3)  Hydrochlorothiazide 25 Mg Tabs (Hydrochlorothiazide) .... One Tablet By Mouth Daily 4)  Flonase 50 Mcg/act Susp (Fluticasone Propionate) .... Take 2 Sprays in Each Nostril Daily For 3 Days, Then One Spray in Each Nostil Daily For 2 Weeks 5)  Claritin 10 Mg Tabs (Loratadine) .... Take 1 Tablet By Mouth Once A Day 6)  Tylenol 325 Mg Tabs (Acetaminophen) .... Take 1-2 Tabs By Mouth Up To Every 6 Hours As Needed For Pain. 7)  Advil 200 Mg Caps (Ibuprofen) .... Take 1 Tab By Mouth  Up To Every 6 Hours As Needed For Pain 8)  Albuterol Sulfate (2.5 Mg/23ml) 0.083% Nebu (Albuterol Sulfate) .[name].. 1 Neb Four Times A Day As Needed 9)  Ambien 5 Mg Tabs (Zolpidem Tartrate) .... Take 1 Tablet By Mouth At Bedtime 10)  Atrovent Hfa 17 Mcg/act Aers (Ipratropium Bromide Hfa) .... 2 Puffs . Qid As Needed Rescue 11)  Dulera 200-5 Mcg/act Aero (Mometasone Furo-Formoterol Fum) .... Use Two Times A Day For Asthma. 12)  Lisinopril 20 Mg Tabs (Lisinopril) .... Take One Tablet Daily For Blood Pressure. 13)  Epipen 0.3 Mg/0.14ml Devi (Epinephrine) .... Use As Directed As Needed Severe Allergic Reaction  Allergies (verified): No Known Drug Allergies  Past History:  Past Medical History: Last updated: 02/01/2009 ACANTHOSIS NIGRICANS (ICD-701.2) FOOT PAIN, RIGHT (ICD-729.5) MENORRHAGIA (ICD-626.2) ASTHMA, EXTRINSIC W/ACUTE EXACERBATION (ICD-493.02) HYPERTENSION, ESSENTIAL NOS (ICD-401.9) DENTAL PAIN (ICD-525.9) INSOMNIA (ICD-780.52) OBESITY, MORBID (ICD-278.01)  ABDOMINAL PAIN (ICD-789.00) HELICOBACTER PYLORI INFECTION (ICD-041.86) SLEEP APNEA (ICD-780.57) COPD (ICD-496) ASTHMA (ICD-493.90)- Skin test 02/01/09 Pos Hemorrhoids. DEPRESSION (ICD-311) GERD (ICD-530.81) WRIST PAIN, RIGHT (ICD-719.43) TOBACCO USER (ICD-305.1) TUBAL LIGATION, HX OF (ICD-V26.51)  Past Surgical  History: Last updated: 04/10/2006 Tubal ligation, bilateral, 1996  Family History: Last updated: 12/16/2008 daughter, grandmother, neice- asthma Aunt died- lung cancer  Social History: Last updated: 12/16/2008 Daughter in [employer] Not married Former smoker. 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  Risk Factors: Exercise: yes (05/22/2009)  Risk Factors: Smoking Status: quit (05/22/2009) Passive Smoke Exposure: no (05/22/2009)  Review of Systems      See HPI       The patient complains of dyspnea on exertion.  The patient denies anorexia, fever, weight loss, weight gain, vision loss, decreased hearing, hoarseness, chest pain, syncope, peripheral edema, prolonged cough, headaches, hemoptysis, melena, and severe indigestion/heartburn.    Vital Signs:  Patient profile:   40 year old female Height:      65 inches Weight:      345 pounds BMI:     57.62 O2 Sat:      100 % on Room air Pulse rate:   78 / minute BP sitting:   126 / 74  (left arm) Cuff size:   regular  Vitals Entered By: Reynaldo Minium CMA (June 13, 2009 10:30 AM)  O2 Flow:  Room air CC: Asthma flare up-wheexing/SOB;cough-clear,sneezing,stopped up. Comments BP taken on wrist.[name] Welchel CMA  June 13, 2009 10:30 AM    Physical Exam  Additional Exam:  General: A/Ox3; pleasant and cooperative, NAD,obese, resting room air sat 98% SKIN: no rash, lesions. Dry skin with light excoriation NODES: no lymphadenopathy HEENT: [place]/AT, EOM- WNL , TM-WNL, Nose- wet sniffing, Throat- clear and wnl, tongue stud, Melampatti IV, multi dental caries  NECK: Supple w/ fair ROM, JVD- none, normal carotid impulses w/o bruits Thyroid- CHEST: Mild wheeze right chest, fair airflow, slight cough without active wheeze.[name] HEART: RRR, no m/g/r heard ABDOMEN: Soft and nl; ZOX:WRUE, nl pulses, no edema  NEURO: Grossly  intact to observation      Impression & Recommendations:  Problem # 1:  EXTRINSIC ASTHMA, UNSPECIFIED (ICD-493.00) Exacerbation yesterday. The way she is sniffing she may be dealing with early allergic or viral syndrome. Hopefully the Xolair will cover the allergy component. I don't expect Xolair shot given in both arms to cause shoulder pain only in the left.   Problem # 2:  RHINITIS (ICD-472.0)  She has been off flonase and asks about Nasonex. We discussd claritin.  Problem # 3:  SLEEP APNEA (ICD-780.57)  She asked to go ahead and schedule sleep study. We discussed this again. Her body habitus, snoring and sleepiness make this concern valid.  Orders: Est. Patient Level III (45409) Sleep Disorder Referral (Sleep Disorder)  Patient Instructions: 1)  Please schedule a follow-up appointment in 1 month. 2)  See [facility] to schedule sleep study 3)  Refilled flonase/ fluticasone steroid nasal spray for allergy- use 2 puffs each nostril daily 4)  Continue Dulera, 2 puffs and rinse, twice dail 5)  Use the rescue inhaler Proair up to 4 times daily as needed 6)  Ask your medical doctor to refill your general meds, like ambien and omeprazole. Prescriptions: FLONASE 50 MCG/ACT SUSP (FLUTICASONE PROPIONATE) Take 2 sprays in each nostril daily for 3 days, then one spray in each nostil  daily for 2 weeks  #1 x prn   Entered and Authorized by:   [name] MD   Signed by:   [name] MD on 06/13/2009   Method used:   Print then Give to Patient   RxID:   [id number]

## 2010-05-29 NOTE — Letter (Signed)
Summary: [employer]  [name]   [place], [place] [address]   Phone: [phone]  Fax: [phone]    Today's Date: February 02, 2010  Name of Patient: April [name]  The above named patient is able to return to work on Monday February 05, 2010.   Special Instructions:  [ X] None  [  ] To be off the remainder of today, returning to the normal work / school schedule tomorrow.  [  ] To be off until the next scheduled appointment on ______________________.  [  ] Other ________________________________________________________________ ________________________________________________________________________  If any questions or concerns please call our office at [phone].    Sincerely yours,     Jason Coop

## 2010-05-29 NOTE — Assessment & Plan Note (Signed)
Summary: [facility]  Nurse Visit   Allergies: No Known Drug Allergies  Medication Administration  Injection # 1:    Medication: Xolair (omalizumab) 150mg     Diagnosis: EXTRINSIC ASTHMA, UNSPECIFIED (ICD-493.00)    Route: SQ    Site: R deltoid    Exp Date: 08/2012    Lot #: [id number]    Mfr: Mendel Ryder    Comments: Injection given by Clarise Cruz in allergy lab. Xolair 225mg . 0.24ml x 1 in Right and Left Deltoid. Pt waited 30 minutes.     Patient tolerated injection without complications  Orders Added: 1)  Admin of patients own med IM/SQ [[id number]]

## 2010-05-29 NOTE — Progress Notes (Signed)
Summary: talk to nurse  Phone Note Call from Patient Call back at [phone]   Caller: Patient Call For: young Summary of Call: need to talk to nurse about asthma. Initial call taken by: Rickard Patience,  February 02, 2010 1:44 PM  Follow-up for Phone Call        Spoke with patient-needs note stating that she can return to work Monday 02-05-10; spoke with CDY-he is okay with this. Letter has been typed and signed. Pt can come by and pick up letter.  Pt aware and will come by and pick up letter.April [name] CMA  February 02, 2010 2:22 PM

## 2010-05-29 NOTE — Letter (Signed)
Summary: [employer]  [name]   [place], [place] [address]   Phone: [phone]  Fax: [phone]    Today's Date: December 06, 2009  Name of Patient: April [name]  The above named patient had a medical visit today at: 2:30 pm for Injections.  Please take this into consideration when reviewing the time away from work/school.    Special Instructions:  [ X ] None  [  ] To be off the remainder of today, returning to the normal work / school schedule tomorrow.  [  ] To be off until the next scheduled appointment on ______________________.  [  ] Other ________________________________________________________________ ________________________________________________________________________   Sincerely yours,    Jason Coop

## 2010-05-29 NOTE — Miscellaneous (Signed)
Summary: EPIPEN RX  Clinical Lists Changes  Medications: Added new medication of EPIPEN 0.3 MG/0.3ML DEVI (EPINEPHRINE) Use as directed as needed severe allergic reaction - Signed Rx of EPIPEN 0.3 MG/0.3ML DEVI (EPINEPHRINE) Use as directed as needed severe allergic reaction;  #1 x 11;  Signed;  Entered by: Reynaldo Minium CMA;  Authorized by: [name] MD;  Method used: Electronically to [facility] [phone]*, [address] [place], [place]  [address], Ph: [phone], Fax: [phone]    Prescriptions: EPIPEN 0.3 MG/0.3ML DEVI (EPINEPHRINE) Use as directed as needed severe allergic reaction  #1 x 11   Entered by:   Reynaldo Minium CMA   Authorized by:   [name] MD   Signed by:   Reynaldo Minium CMA on 05/05/2009   Method used:   Electronically to        [facility] [phone]* (retail)       [address]       [place], [place]  [address]       Ph: [phone]       Fax: [phone]   RxID:   [id number]

## 2010-05-29 NOTE — Assessment & Plan Note (Signed)
Summary: April [name]   Copy to:  [facility] Primary Provider/Referring Provider:  Valetta Close MD  CC:  follow up visit-no sleep study.  History of Present Illness:  April 18, 2009- Asthma, rhinitis Wakes up with heartburn and wheezing. She is on omeprazole twice daily before meals. At home she needs to use her Atrovent more than 4 times daily. We reviewed allergy records- IgE 139.4 on 12/16/08. We spent 20 minutes today discussing Xolair, risks, benefits, goals and guidelines.  June 13, 2009- Asthma, rhintis Asthma flare yestrerday- unknown trigger at work. Stayed at work. She has noted some shoulder pain on left side, above where she gets Xolair inj. She gets Xolair inj in both arms each time and doesn't notice this pain in the right arm. She thinks Xolair may be helping asthma on balance, but it is eatrly still. I again explained Xolair would only help allergic asthma- not viruses, stress, reflux or irritant exposures. She still has [employer]. She asks about continuing Nasonex and we discused antihistamines. Plans to stay out of work today.  August 11, 2009- Asthma, rhinitis She has not had her sleep study. She feels she can't leave her teenage daughters at home, so this will not be done for now. Sleeping some better. She wakes some. Not snoring as badly now. Asthma is doing much better with Xolair. She is vey pleased that she hasn't had to go to the hospital at all. Her meds control mild morning wheeze. We discussed Xolair again.  Current Medications (verified): 1)  Omeprazole 40 Mg Cpdr (Omeprazole) .[name].. 1 By Mouth Two Times A Day 2)  Proair Hfa 108 (90 Base) Mcg/act Aers (Albuterol Sulfate) .... 2 Puffs Four Times A Day As Needed 3)  Hydrochlorothiazide 25 Mg Tabs (Hydrochlorothiazide) .... One Tablet By Mouth Daily 4)  Flonase 50 Mcg/act Susp (Fluticasone Propionate) .... Take 2 Sprays in Each Nostril Daily For 3 Days, Then One Spray in Each Nostil Daily For  2 Weeks 5)  Claritin 10 Mg Tabs (Loratadine) .... Take 1 Tablet By Mouth Once A Day 6)  Albuterol Sulfate (2.5 Mg/20ml) 0.083% Nebu (Albuterol Sulfate) .[name].. 1 Neb Four Times A Day As Needed 7)  Restoril 7.5 Mg Caps (Temazepam) .... Take 1 Tab By Mouth At Bedtime 8)  Atrovent Hfa 17 Mcg/act Aers (Ipratropium Bromide Hfa) .... 2 Puffs . Qid As Needed Rescue 9)  Dulera 200-5 Mcg/act Aero (Mometasone Furo-Formoterol Fum) .... Use Two Times A Day For Asthma. 10)  Lisinopril 20 Mg Tabs (Lisinopril) .... Take One Tablet Daily For Blood Pressure. 11)  Epipen 0.3 Mg/0.33ml Devi (Epinephrine) .... Use As Directed As Needed Severe Allergic Reaction 12)  Xolair 150 Mg Solr (Omalizumab) .... 300mg  Every 2 Weeks 13)  Ambien 10 Mg Tabs (Zolpidem Tartrate) .... Take 1 By Mouth At Bedtime  Allergies (verified): No Known Drug Allergies  Past History:  Past Medical History: Last updated: 02/01/2009 ACANTHOSIS NIGRICANS (ICD-701.2) FOOT PAIN, RIGHT (ICD-729.5) MENORRHAGIA (ICD-626.2) ASTHMA, EXTRINSIC W/ACUTE EXACERBATION (ICD-493.02) HYPERTENSION, ESSENTIAL NOS (ICD-401.9) DENTAL PAIN (ICD-525.9) INSOMNIA (ICD-780.52) OBESITY, MORBID (ICD-278.01) ABDOMINAL PAIN (ICD-789.00) HELICOBACTER PYLORI INFECTION (ICD-041.86) SLEEP APNEA (ICD-780.57) COPD (ICD-496) ASTHMA (ICD-493.90)- Skin test 02/01/09 Pos Hemorrhoids. DEPRESSION (ICD-311) GERD (ICD-530.81) WRIST PAIN, RIGHT (ICD-719.43) TOBACCO USER (ICD-305.1) TUBAL LIGATION, HX OF (ICD-V26.51)  Past Surgical History: Last updated: 04/10/2006 Tubal ligation, bilateral, 1996  Family History: Last updated: 12/16/2008 daughter, grandmother, neice- asthma Aunt died- lung cancer  Social History: Last updated: 12/16/2008 Daughter in [employer] Not married Former  smoker. 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  Risk Factors: Exercise: yes  (06/23/2009)  Risk Factors: Smoking Status: quit (06/23/2009) Passive Smoke Exposure: no (06/23/2009)  Review of Systems      See HPI  The patient denies anorexia, fever, weight loss, weight gain, vision loss, decreased hearing, hoarseness, chest pain, syncope, dyspnea on exertion, peripheral edema, prolonged cough, headaches, hemoptysis, abdominal pain, and severe indigestion/heartburn.    Vital Signs:  Patient profile:   40 year old female Height:      65 inches Weight:      341.50 pounds BMI:     57.03 O2 Sat:      99 % on Room air Pulse rate:   77 / minute BP sitting:   122 / 80  (left arm) Cuff size:   large  Vitals Entered By: Reynaldo Minium CMA (August 11, 2009 10:32 AM)  O2 Flow:  Room air  Physical Exam  Additional Exam:  General: A/Ox3; pleasant and cooperative, NAD, very obese, resting room air sat 99%. Very heavy use of talcum powder irritated my throat coming into the room SKIN: no rash, lesions.  NODES: no lymphadenopathy HEENT: [place]/AT, EOM- WNL , TM-WNL, Nose- wet sniffing, Throat- clear and wnl, tongue stud, Mallampati IV, multi dental caries  NECK: Supple w/ fair ROM, JVD- none, normal carotid impulses w/o bruits Thyroid- CHEST: Clear with no wheeze or cough HEART: RRR, no m/g/r heard ABDOMEN: Soft and nl; YNW:GNFA, nl pulses, no edema  NEURO: Grossly intact to observation      Impression & Recommendations:  Problem # 1:  EXTRINSIC ASTHMA, UNSPECIFIED (ICD-493.00) Much better control with credit to the Xolair.  Problem # 2:  SLEEP APNEA (ICD-780.57)  Suspected but untested. She may become a candidate for home testing when available. Weight loss is advised.  Medications Added to Medication List This Visit: 1)  Xolair 150 Mg Solr (Omalizumab) .... 300mg  every 2 weeks 2)  Ambien 10 Mg Tabs (Zolpidem tartrate) .... Take 1 by mouth at bedtime  Other Orders: Est. Patient Level II (21308)  Patient Instructions: 1)  Please schedule a follow-up  appointment in 4 months. 2)  Continue Xolair shots 3)  It will help you feel better and breathe better to lose weight by eating smaller portions and less starch and bread. 4)  Claritin/ loratadine is otc as an antihistamine for allergies.

## 2010-05-29 NOTE — Assessment & Plan Note (Signed)
Summary: 2 week return/mhh  Nurse Visit   Allergies: No Known Drug Allergies  Medication Administration  Injection # 1:    Medication: Xolair (omalizumab) 150mg     Diagnosis: 493.00    Route: SQ    Site: R deltoid    Exp Date: 10/2012    Lot #: [id number]    Mfr: Salome Spotted    Comments: 0.9 ML IN RIGHT AND LEFT ARM PT DIDNT WAIT CHARGED [phone]    Given by: Dimas Millin IN [facility]   Medication Administration  Injection # 1:    Medication: Xolair (omalizumab) 150mg     Diagnosis: 493.00    Route: SQ    Site: R deltoid    Exp Date: 10/2012    Lot #: [id number]    Mfr: Salome Spotted    Comments: 0.9 ML IN RIGHT AND LEFT ARM PT DIDNT WAIT CHARGED [id number]    Given by: TAMMY SCOTT IN [facility]

## 2010-05-29 NOTE — Assessment & Plan Note (Signed)
Summary: xolair/ mbw  Nurse Visit   Allergies: No Known Drug Allergies  Medication Administration  Injection # 1:    Medication: Xolair (omalizumab) 150mg     Diagnosis: EXTRINSIC ASTHMA, UNSPECIFIED (ICD-493.00)    Route: SQ    Site: L deltoid    Exp Date: 08/27/2012    Lot #: [id number]    Mfr: GENENTECH    Comments: 0.9 ML IN LEFT AND RIGHT ARM PT DIDNT WAIT CHARGED [id number]    Given by: TAMMY SCOTT IN [facility]   Medication Administration  Injection # 1:    Medication: Xolair (omalizumab) 150mg     Diagnosis: EXTRINSIC ASTHMA, UNSPECIFIED (ICD-493.00)    Route: SQ    Site: L deltoid    Exp Date: 08/27/2012    Lot #: [id number]    Mfr: GENENTECH    Comments: 0.9 ML IN LEFT AND RIGHT ARM PT DIDNT WAIT CHARGED [id number]    Given by: TAMMY SCOTT IN [facility]

## 2010-05-29 NOTE — Assessment & Plan Note (Signed)
Summary: 1ST Cristal Deer [name]/RJC   Copy to:  [facility] Primary Provider/Referring Provider:  Valetta Close MD  CC:  1ST Xolair Injection.  History of Present Illness: Seen to okay initial Xolair injection today. M ild chronic wheeze. We again discussed the goals of Xolair, and reasonable expectations. OK to start. Directed around to allergy lab.  Current Medications (verified): 1)  Omeprazole 40 Mg Cpdr (Omeprazole) .[name].. 1 By Mouth Two Times A Day 2)  Proair Hfa 108 (90 Base) Mcg/act Aers (Albuterol Sulfate) .... 2 Puffs Four Times A Day As Needed 3)  Hydrochlorothiazide 25 Mg Tabs (Hydrochlorothiazide) .... One Tablet By Mouth Daily 4)  Flonase 50 Mcg/act Susp (Fluticasone Propionate) .... Take 2 Sprays in Each Nostril Daily For 3 Days, Then One Spray in Each Nostil Daily For 2 Weeks 5)  Claritin 10 Mg Tabs (Loratadine) .... Take 1 Tablet By Mouth Once A Day 6)  Tylenol 325 Mg Tabs (Acetaminophen) .... Take 1-2 Tabs By Mouth Up To Every 6 Hours As Needed For Pain. 7)  Advil 200 Mg Caps (Ibuprofen) .... Take 1 Tab By Mouth  Up To Every 6 Hours As Needed For Pain 8)  Albuterol Sulfate (2.5 Mg/82ml) 0.083% Nebu (Albuterol Sulfate) .[name].. 1 Neb Four Times A Day As Needed 9)  Ambien 5 Mg Tabs (Zolpidem Tartrate) .... Take 1 Tablet By Mouth At Bedtime 10)  Ultram 50 Mg Tabs (Tramadol Hcl) .... Take 1 Tablet By Mouth Every 8 Hours As Needed For Pain 11)  Peridex 0.12 % Soln (Chlorhexidine Gluconate) .... Swish 15ml For 30 Seconds and Then Spit Twice A Day 12)  Atrovent Hfa 17 Mcg/act Aers (Ipratropium Bromide Hfa) .... 2 Puffs . Qid As Needed Rescue 13)  Dulera 200-5 Mcg/act Aero (Mometasone Furo-Formoterol Fum) .... Use Two Times A Day For Asthma. 14)  Lisinopril 20 Mg Tabs (Lisinopril) .... Take One Tablet Daily For Blood Pressure. 15)  Vicodin Es 7.5-750 Mg Tabs (Hydrocodone-Acetaminophen) .... Take One Tablet Every 8 Hours As Needed For Pain Over The Next 2 Days. 16)   Epipen 0.3 Mg/0.23ml Devi (Epinephrine) .... Use As Directed As Needed Severe Allergic Reaction  Allergies (verified): No Known Drug Allergies  Vital Signs:  Patient profile:   40 year old female Height:      65 inches Weight:      342.25 pounds BMI:     57.16 O2 Sat:      100 % on Room air Pulse rate:   88 / minute BP sitting:   122 / 84  (left arm) Cuff size:   regular  Vitals Entered By: Reynaldo Minium CMA (May 11, 2009 1:38 PM)  O2 Flow:  Room air CC: 1ST Xolair Injection Comments BP taken on wrist. Reynaldo Minium CMA  May 11, 2009 1:38 PM     Appended Document: Orders Update    Clinical Lists Changes  Orders: Added new Service order of No Charge Patient Arrived (NCPA0) (NCPA0) - Signed

## 2010-05-29 NOTE — Progress Notes (Signed)
Summary: appt  Phone Note Call from Patient Call back at Home Phone [phone]   Caller: Patient Call For: young Summary of Call: Pt states she was at [facility] er last night and [facility] outpatient clinic called her today and told her she needs to make appt with CY today, pls advise. Initial call taken by: Darletta Moll,  March 01, 2010 10:43 AM  Follow-up for Phone Call        [facility] Vernie Murders  March 01, 2010 10:44 AM  still wheezing, HA, prod cough with yellow mucus, increased SOB.  states was told by the outpatient clinic this morning (via phone call) that she may need to be admitted, though pt states she does feel better today.  was given neb tx, and iv magnesium; dc'd w/ medrol dose pak.  would like to know if she may be seen today and if she can get her xolair.  please advise, thanks!   ~ i have printed off ED note from EMR.  ALLERGIES: 1)  ! * Ace Inhibitors/ Lisinopril   Follow-up by: Boone Master CNA/MA,  March 01, 2010 11:06 AM  Additional Follow-up for Phone Call Additional follow up Details #1::        We will try to work her in.  Additional Follow-up by: [name] MD,  March 01, 2010 12:48 PM

## 2010-05-29 NOTE — Assessment & Plan Note (Signed)
Summary: R arm pain, L leg pain x 2wks/pcp-madera/hla   Vital Signs:  Patient profile:   40 year old female Height:      65 inches Weight:      330.5 pounds BMI:     55.20 Temp:     98.8 degrees F oral Pulse rate:   80 / minute BP sitting:   128 / 88  (right arm)  Vitals Entered By: Filomena Jungling NT II (December 22, 2009 9:38 AM) CC: NEED REFILLS, RIGHT ARM AND LEG, Depression Is Patient Diabetic? No Pain Assessment Patient in pain? yes     Location: ARM AND LEG PAIN Intensity: 10 Type: aching Onset of pain  Nutritional Status BMI of > 30 = obese  Have you ever been in a relationship where you felt threatened, hurt or afraid?No   Does patient need assistance? Functional Status Self care Ambulation Normal   Primary Care Provider:  Valetta Close MD  CC:  NEED REFILLS, RIGHT ARM AND LEG, and Depression.  History of Present Illness: Patient comes today with complaints of arm and leg pain.    She has had the arm pain for one month and she feels like it is getting worse.  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  No trauma.  Advil, excedrin and tylenol don't stop the pain. She has never had pain in the arm before.  Pain worst at the end of the day.  Pain comes and goes.  Can hurt with activity and with rest.  She takes a Tylenol PM and she goes asleep - the pain doesn't wake her up at night.  She has tried heat pads and ice and this does not help.  no numbness or tingling. no weakness.  Left knee arthritis has been worse for the past 2 weeks. No recent trauma or falls.  She is trying advil, tylenol, excedrin but none of this helps.  The gel does not help.  She said that percocet did work.  Vicodin did  not work. no weakness.  No fevers, no cold symptoms, no urinary incontinence, fecal incontinence.  Patient has lost 5 pounds, she is using [employer].  BP 128/88 - did not take meds today  Depression History:      The patient denies a depressed mood  most of the day and a diminished interest in her usual daily activities.         Preventive Screening-Counseling & Management  Alcohol-Tobacco     Smoking Status: quit     Year Quit: 2008     Passive Smoke Exposure: no  Caffeine-Diet-Exercise     Does Patient Exercise: yes     Type of exercise: WALKING  Current Problems (verified): 1)  Arm Pain, Right  (ICD-729.5) 2)  Extrinsic Asthma, Unspecified  (ICD-493.00) 3)  Routine Gynecological Examination  (ICD-V72.31) 4)  Arm Pain, Left  (ICD-729.5) 5)  Chest Pain  (ICD-786.50) 6)  Gastroesophageal Reflux Disease, Chronic  (ICD-530.81) 7)  Knee Pain, Left  (ICD-719.46) 8)  Baker's Cyst, Left Knee  (ICD-727.51) 9)  Back Pain, Chronic, Intermittent  (ICD-724.5) 10)  Dental Pain  (ICD-525.9) 11)  Leg Edema, Left  (ICD-782.3) 12)  Hemorrhoids  (ICD-455.6) 13)  Diarrhea  (ICD-787.91) 14)  Headache  (ICD-784.0) 15)  Acanthosis Nigricans  (ICD-701.2) 16)  Foot Pain, Right  (ICD-729.5) 17)  Menorrhagia  (ICD-626.2) 18)  Asthma, Extrinsic W/acute Exacerbation  (ICD-493.02) 19)  Rhinitis  (ICD-472.0) 20)  Hypertension, Essential Nos  (  ICD-401.9) 21)  Dental Pain  (ICD-525.9) 22)  Insomnia  (ICD-780.52) 23)  Obesity, Morbid  (ICD-278.01) 24)  Abdominal Pain  (ICD-789.00) 25)  Helicobacter Pylori Infection  (ICD-041.86) 26)  Sleep Apnea  (ICD-780.57) 27)  COPD  (ICD-496) 28)  Asthma  (ICD-493.90) 29)  Depression  (ICD-311) 30)  Gerd  (ICD-530.81) 31)  Wrist Pain, Right  (ICD-719.43) 32)  Tobacco User  (ICD-305.1) 33)  Tubal Ligation, Hx of  (ICD-V26.51)  Current Medications (verified): 1)  Diovan 160 Mg Tabs (Valsartan) .[name].. 1 By Mouth Once Daily 2)  Omeprazole 40 Mg Cpdr (Omeprazole) .[name].. 1 By Mouth Two Times A Day 3)  Hydrochlorothiazide 25 Mg Tabs (Hydrochlorothiazide) .... One Tablet By Mouth Daily 4)  Flonase 50 Mcg/act Susp (Fluticasone Propionate) .... Take 2 Sprays in Each Nostril Daily For 3 Days, Then One Spray in Each  Nostil Daily For 2 Weeks 5)  Claritin 10 Mg Tabs (Loratadine) .... Take 1 Tablet By Mouth Once A Day 6)  Albuterol Sulfate (2.5 Mg/94ml) 0.083% Nebu (Albuterol Sulfate) .[name].. 1 Neb Four Times A Day As Needed 7)  Atrovent Hfa 17 Mcg/act Aers (Ipratropium Bromide Hfa) .... 2 Puffs . Qid As Needed Rescue 8)  Dulera 200-5 Mcg/act Aero (Mometasone Furo-Formoterol Fum) .... Use Two Times A Day For Asthma. 9)  Epipen 0.3 Mg/0.29ml Devi (Epinephrine) .... Use As Directed As Needed Severe Allergic Reaction 10)  Xolair 150 Mg Solr (Omalizumab) .... 300mg  Every 2 Weeks 11)  Ambien 10 Mg Tabs (Zolpidem Tartrate) .... Take 1 By Mouth At Bedtime As Needed To Fall Sleep. 12)  Proair Hfa 108 (90 Base) Mcg/act Aers (Albuterol Sulfate) .... 2 Puffs Four Times A Day As Needed 13)  Ketorolac Tromethamine 10 Mg Tabs (Ketorolac Tromethamine) .... Take 1 Tablet By Mouth Every 6 Hours As Needed For Severe Pain. Take With Food.  Allergies (verified): 1)  ! * Ace Inhibitors/ Lisinopril  Past History:  Past Medical History: Last updated: 02/01/2009 ACANTHOSIS NIGRICANS (ICD-701.2) FOOT PAIN, RIGHT (ICD-729.5) MENORRHAGIA (ICD-626.2) ASTHMA, EXTRINSIC W/ACUTE EXACERBATION (ICD-493.02) HYPERTENSION, ESSENTIAL NOS (ICD-401.9) DENTAL PAIN (ICD-525.9) INSOMNIA (ICD-780.52) OBESITY, MORBID (ICD-278.01) ABDOMINAL PAIN (ICD-789.00) HELICOBACTER PYLORI INFECTION (ICD-041.86) SLEEP APNEA (ICD-780.57) COPD (ICD-496) ASTHMA (ICD-493.90)- Skin test 02/01/09 Pos Hemorrhoids. DEPRESSION (ICD-311) GERD (ICD-530.81) WRIST PAIN, RIGHT (ICD-719.43) TOBACCO USER (ICD-305.1) TUBAL LIGATION, HX OF (ICD-V26.51)  Past Surgical History: Last updated: 04/10/2006 Tubal ligation, bilateral, 1996  Family History: Last updated: 12/16/2008 daughter, grandmother, neice- asthma Aunt died- lung cancer  Social History: Last updated: 12/16/2008 Daughter in [employer] Not married Former smoker. Not smoking currently No alcohol No  drugs Drives forklift at [employer] - dust exposure on job, has   tried mask but cannot tolerate it.  Risk Factors: Exercise: yes (12/22/2009)  Risk Factors: Smoking Status: quit (12/22/2009) Passive Smoke Exposure: no (12/22/2009)  Review of Systems       see HPI  Physical Exam  General:  obese woman in NAD Mouth:  pharynx pink and moist.   Neck:  supple, full ROM, and no masses.   Lungs:  normal respiratory effort, normal breath sounds, no crackles, and no wheezes.   Heart:  normal rate, regular rhythm, and no murmur.   Abdomen:  soft, non-tender, and normal bowel sounds.   Msk:  full ROM both shoulders with rotation behind head and behind lower back. some pain with active flexion and extension of right arm. no pain with passive movement.  No tenderness of shoulder joint but tenderness to palpation of biceps,  triceps, deltoid and trapezius on the right side.  Difficult to determine at times strutures being palpated because of patient's size. left knee is mildly ttp at the joint and with active/passive extension and flexion.  Pulses:  2+ bilateral pedal pulses Extremities:  no edema Neurologic:  CN grossly intact.  sensation intact to light touch all 4 extremities.  mildly antalgic gait.  Cervical Nodes:  no anterior cervical adenopathy.   Psych:  memory intact for recent and remote, normally interactive, good eye contact, not anxious appearing, and not depressed appearing.     Impression & Recommendations:  Problem # 1:  ARM PAIN, RIGHT (ICD-729.5) Pt has full ROM of her shoulder and is tender primarily in her muscles of the upper arm and trapezius.  I suspect muscle strain.  As she has tried some OTC NSAIDs for this, will try short course of stronger NSAID ketorolac #30 (to be taken with food, pt was educated about this).  Also physical therapy. 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 Orders: Physical Therapy Referral (PT)  Problem # 2:  KNEE PAIN, LEFT (ICD-719.46) Patient has long history of OA of her left knee - she really needs to lose weight to make this better (see problem #3).  However for short term pain relief will try stronger NSAID.  She has already tried knee injections of steroids, but these cannot be given indefinitely.  She requested percocet, saying that vicodin has not worked in the past but that percocet has, however I do not see this in her records.  Regardless, I would expect that NSAIDs would work better for OA pain than narcotics, so I avoided prescribing narcotics today.  In the referral to PT I also mentioned the patient's knee, so perhaps they can recommend strategies to help with her knee as well as with her arm. Her updated medication list for this problem includes:    Ketorolac Tromethamine 10 Mg Tabs (Ketorolac tromethamine) .[name]... Take 1 tablet by mouth every 6 hours as needed for severe pain. take with food.  Orders: Physical Therapy Referral (PT)  Problem # 3:  OBESITY, MORBID (ICD-278.01) Pt would like to start a pill for this.  Spent over 30 minutes educating patient about weight loss and possible options, however xenical is typically not well tolerated because bowel incontinence and gas and meridia can only be used in pts with normal BP and only with close follow-up.  She seemed determined that she could only get started on losing weight if she received a medicine.  Deferred starting of these medicines to her PCP during her regular appointment.    Complete Medication List: 1)  Diovan 160 Mg Tabs (Valsartan) .[name].. 1 by mouth once daily 2)  Omeprazole 40 Mg Cpdr (Omeprazole) .[name].. 1 by mouth two times a day 3)  Hydrochlorothiazide 25 Mg Tabs (Hydrochlorothiazide) .... One tablet by mouth daily 4)  Flonase 50 Mcg/act Susp (Fluticasone propionate) .... Take 2 sprays in each nostril daily for 3  days, then one spray in each nostil daily for 2 weeks 5)  Claritin 10 Mg Tabs (Loratadine) .... Take 1 tablet by mouth once a day 6)  Albuterol Sulfate (2.5 Mg/31ml) 0.083% Nebu (Albuterol sulfate) .[name].. 1 neb four times a day as needed 7)  Atrovent Hfa 17 Mcg/act Aers (Ipratropium bromide hfa) .... 2 puffs . qid  as needed rescue 8)  Dulera 200-5 Mcg/act Aero (Mometasone furo-formoterol fum) .... Use two times a day for asthma. 9)  Epipen 0.3 Mg/0.12ml Devi (Epinephrine) .... Use as directed as needed severe allergic reaction 10)  Xolair 150 Mg Solr (Omalizumab) .... 300mg  every 2 weeks 11)  Ambien 10 Mg Tabs (Zolpidem tartrate) .... Take 1 by mouth at bedtime as needed to fall sleep. 12)  Proair Hfa 108 (90 Base) Mcg/act Aers (Albuterol sulfate) .... 2 puffs four times a day as needed 13)  Ketorolac Tromethamine 10 Mg Tabs (Ketorolac tromethamine) .... Take 1 tablet by mouth every 6 hours as needed for severe pain. take with food.  Patient Instructions: 1)  You have been given a prescription for new medicine - you may use this 1 tablet by mouth every 6 hours as needed for severe pain. 2)  You will be given a referral to physical therapy. 3)  Please follow-up with your PCP in 4-6 weeks. Prescriptions: KETOROLAC TROMETHAMINE 10 MG TABS (KETOROLAC TROMETHAMINE) Take 1 tablet by mouth every 6 hours as needed for severe pain. Take with food.  #30 x 0   Entered and Authorized by:   Danelle Berry, MD   Signed by:   Danelle Berry, MD on 12/22/2009   Method used:   Electronically to        [facility] [phone]* (retail)       [address]       [place], [place]  [address]       Ph: [phone]       Fax: [phone]   RxID:   [id number]   Prevention & Chronic Care Immunizations   Influenza vaccine: Fluvax 3+  (03/03/2009)    Tetanus booster: Not documented    Pneumococcal vaccine: Pneumovax  (03/17/2009)  Other Screening   Pap smear: Not documented   Pap smear  action/deferral: Deferred  (04/17/2009)   Smoking status: quit  (12/22/2009)  Lipids   Total Cholesterol: 183  (10/06/2009)   Lipid panel action/deferral: Lipid Panel ordered   LDL: 123  (10/06/2009)   LDL Direct: Not documented   HDL: 47  (10/06/2009)   Triglycerides: 67  (10/06/2009)  Hypertension   Last Blood Pressure: 128 / 88  (12/22/2009)   Serum creatinine: 0.69  (10/06/2009)   Serum potassium 4.0  (10/06/2009)  Self-Management Support :   Personal Goals (by the next clinic visit) :      Personal blood pressure goal: 140/90  (01/18/2009)   Patient will work on the following items until the next clinic visit to reach self-care goals:     Medications and monitoring: take my medicines every day, bring all of my medications to every visit  (12/22/2009)     Eating: drink diet soda or water instead of juice or soda, eat more vegetables, use fresh or frozen vegetables, eat foods that are low in salt, eat baked foods instead of fried foods, eat fruit for snacks and desserts  (12/22/2009)     Activity: take the stairs instead of the elevator, park at the far end of the parking lot  (12/22/2009)    Hypertension self-management support: Education handout  (12/22/2009)   Hypertension education handout printed

## 2010-05-29 NOTE — Progress Notes (Signed)
Summary: note req  Phone Note Call from Patient   Caller: Patient Call For: [name] Summary of Call: pt is going to leave work today and go home to do a breathing treatment. wants nurse to write a note that she can pick up on fri when she comes in for xolair. [phone] Initial call taken by: Tivis Ringer, CNA,  January 17, 2010 2:55 PM  Follow-up for Phone Call        Spoke with patient-states she needs a letter stating that her asthma acts up at times and had to leave work today to go home and do breathing tx; pt is aware she should be taking her neb machine and meds to work with her so if this happens again she will have it on hand and will not have to leave work-will forward message to CDY to see if he is willing to do this for patient.Reynaldo Minium CMA  January 17, 2010 4:07 PM    Pt aware that CDY is out of office until Thursday morning.Reynaldo Minium CMA  January 17, 2010 4:07 PM   Additional Follow-up for Phone Call Additional follow up Details #1::        Spoke with CDY regarding this message-he will not give a letter to patient-she should carry her nebulizer and meds with her to work.Reynaldo Minium CMA  January 19, 2010 12:15 PM  pt states that she will lose her job without this letter and is requesting that Dr. Maple Hudson call her directly to discuss. I advised I will forward message back to him. Carron Curie CMA  January 19, 2010 12:19 PM      Additional Follow-up for Phone Call Additional follow up Details #2::    I spoke with patient in Allergy lab when she came in for her Uvaldo Rising is willing to take a note to her job stating that she is able to take and use her neb and meds as needed at work. CDY is okay with giving this letter to pt. Letter typed up and given to patient.Reynaldo Minium CMA  January 19, 2010 2:35 PM

## 2010-05-29 NOTE — Progress Notes (Signed)
Summary: samples-LMTCB x 1  Phone Note Call from Patient   Caller: Patient Call For: young Summary of Call: Wants samples of advair, atrovent, and proair. Initial call taken by: Darletta Moll,  March 06, 2010 3:04 PM  Follow-up for Phone Call        Advair is not on her med list- LMOMTCB Vernie Murders  March 06, 2010 3:08 PM  Returning call.Darletta Moll  March 06, 2010 3:17 PM  Spoke with pt.  She states that she was started on advair 500/50 when she was d/c'ed from the hospital recently (however this is not on list of meds on d/c summary).  She is requesting sample.  Also requesting proair and we do not have this currently, can she do proventil or ventolin? Pls advise thanks  Follow-up by: Vernie Murders,  March 06, 2010 3:32 PM  Additional Follow-up for Phone Call Additional follow up Details #1::        OK 1 sample Advair 500 1 puff and rinse two times a day  Important that she keep appointment.  Additional Follow-up by: [name] MD,  March 07, 2010 2:35 PM    Additional Follow-up for Phone Call Additional follow up Details #2::    Spoke with pt and advised needs to be sure and keep this appt tommorrow and can pick up sample of advair and ventolin at that time.  Pt verbalized understanding. Follow-up by: Vernie Murders,  March 07, 2010 4:09 PM

## 2010-05-29 NOTE — Miscellaneous (Signed)
Summary: [facility] Program consent/[facility]  Xolair Inj Program consent/[facility]   Imported By: Lester Jasper 05/08/2009 07:28:18  _____________________________________________________________________  External Attachment:    Type:   Image     Comment:   External Document

## 2010-05-29 NOTE — Letter (Signed)
Summary: Out of Work  [employer]  [name]   [place], [place] [address]   Phone: [phone]  Fax: [phone]    June 13, 2009   Employee:  April [name]    To Whom It May Concern:   For Medical reasons, please excuse the above named employee from work for the following dates:  Start:   06-13-09  End:   06-13-09; Pt was seen in out office today.  If you need additional information, please feel free to contact our office.         Sincerely,     Jason Coop

## 2010-05-29 NOTE — Assessment & Plan Note (Signed)
Summary: xolair/apc  Nurse Visit   Allergies: No Known Drug Allergies  Medication Administration  Injection # 1:    Medication: Xolair (omalizumab) 150mg     Diagnosis: EXTRINSIC ASTHMA, UNSPECIFIED (ICD-493.00)    Route: SQ    Site: R deltoid    Exp Date: 08/27/2012    Lot #: [id number]    Mfr: GENETECH    Comments: 0.9 ML X RIGHT ARM AND 0.9 ML IN LEFT ARM PT WAITED 10 MINS    Patient tolerated injection without complications    Given by: SUSANNE FORD IN ALLERGY LAB  Orders Added: 1)  Administration xolair injection [id number]   Medication Administration  Injection # 1:    Medication: Xolair (omalizumab) 150mg     Diagnosis: EXTRINSIC ASTHMA, UNSPECIFIED (ICD-493.00)    Route: SQ    Site: R deltoid    Exp Date: 08/27/2012    Lot #: [id number]    Mfr: GENETECH    Comments: 0.9 ML X RIGHT ARM AND 0.9 ML IN LEFT ARM PT WAITED 10 MINS    Patient tolerated injection without complications    Given by: SUSANNE FORD IN ALLERGY LAB  Orders Added: 1)  Administration xolair injection [[id number]]

## 2010-05-29 NOTE — Discharge Summary (Signed)
Summary: Hospital Discharge Update    Hospital Discharge Update:  Date of Admission: 03/04/2010 Date of Discharge: 03/05/2010  Brief Summary:  1. Asthma exacerbation - Patient received 10 mg albuterol cont nebs & 125 IV solumedrol in ED.  Once admitted pt was placed on: Duoneb tx Q6 hrs for 24 hrs, then PRN. Albuterol as needed Q3-6 HRS, Prednisone 30 mg by mouth once daily, Advair 500/50 (1 puff two times a day), Flonase NS (1 spray each nare), Claritin 10 mg by mouth once daily. On day of discharge; patient's symptoms improved markedly, expiratory wheezing was decreased, pt denied SOB, CP, or productive cough.  2. Hypokalemia - Patient's hypokalemia most likely a function of albuterol and HCTZ.  Ordered 40 mEq by mouth of KCl prior to patient's discharge.   3. Anemia - Patient's MCV 85 Hgb 11.4 Normocytic anemia. Please follow up in [facility].  4. HTN - continued HCTZ  & Diavan.  5. GERD - continued Protonix  6. Insomnia - continue 5 mg Ambien as needed at bedtime. Patient given prescription for 10 mg of Ambien #30 by mouth at bedtime for help with sleep.    Labs needed at follow-up: CBC with differential, Basic metabolic panel  Other follow-up issues:  Please follow up normocytic anemia: peripheral smear and check ferritin. Please follow up hypokalemia with BMET.  Medication list changes:  Added new medication of AMBIEN 10 MG TABS (ZOLPIDEM TARTRATE) take 1 tablet by mouth at bedtime as needed to fall asleep - Signed Added new medication of CLARITIN 10 MG TABS (LORATADINE) take 1 tablet by mouth once a day - Signed Rx of AMBIEN 10 MG TABS (ZOLPIDEM TARTRATE) take 1 tablet by mouth at bedtime as needed to fall asleep;  #30 x 0;  Signed;  Entered by: Deatra Robinson MD;  Authorized by: Deatra Robinson MD;  Method used: Handwritten Rx of CLARITIN 10 MG TABS (LORATADINE) take 1 tablet by mouth once a day;  #90 x 0;  Signed;  Entered by: Deatra Robinson MD;  Authorized by: Deatra Robinson  MD;  Method used: Handwritten  The medication, problem, and allergy lists have been updated.  Please see the dictated discharge summary for details.  Discharge medications:  DIOVAN 160 MG TABS (VALSARTAN) 1 by mouth once daily OMEPRAZOLE 40 MG CPDR (OMEPRAZOLE) 1 by mouth two times a day HYDROCHLOROTHIAZIDE 25 MG TABS (HYDROCHLOROTHIAZIDE) one tablet by mouth daily FLONASE 50 MCG/ACT SUSP (FLUTICASONE PROPIONATE) Take 2 sprays in each nostril daily for 3 days, then one spray in each nostil daily for 2 weeks CLARITIN 10 MG TABS (LORATADINE) Take 1 tablet by mouth once a day ALBUTEROL SULFATE (2.5 MG/3ML) 0.083% NEBU (ALBUTEROL SULFATE) 1 neb four times a day as needed ATROVENT HFA 17 MCG/ACT AERS (IPRATROPIUM BROMIDE HFA) 2 puffs . qid as needed rescue DULERA 200-5 MCG/ACT AERO (MOMETASONE FURO-FORMOTEROL FUM) Use two times a day for asthma. EPIPEN 0.3 MG/0.3ML DEVI (EPINEPHRINE) Use as directed as needed severe allergic reaction XOLAIR 150 MG SOLR (OMALIZUMAB) 300mg  every 2 weeks AMBIEN 10 MG TABS (ZOLPIDEM TARTRATE) take 1 by mouth at bedtime as needed to fall sleep. PROAIR HFA 108 (90 BASE) MCG/ACT AERS (ALBUTEROL SULFATE) 2 puffs four times a day as needed PREDNISONE 10 MG TABS (PREDNISONE) As Directed AVELOX 400 MG TABS (MOXIFLOXACIN HCL) 1 daily AMBIEN 10 MG TABS (ZOLPIDEM TARTRATE) take 1 tablet by mouth at bedtime as needed to fall asleep CLARITIN 10 MG TABS (LORATADINE) take 1 tablet by mouth once a day  Other  patient instructions:  Follow-Up Appointment in [facility]) scheduled for Wednesday, Nov 16 at 2:30 PM.  If symptoms such as SOB, chest pain, wheezing, return - please call [facility] [phone]) and see if you can be seen/treated by a provider in the clinic. This may help avoid another emergency room visit.  Please finish your home prescriptions for Prednisone and Avelox.  Note: Hospital Discharge Medications & Other Instructions handout was printed,  one copy for patient and a second copy to be placed in hospital chart.

## 2010-05-29 NOTE — Assessment & Plan Note (Signed)
[facility]  Nurse Visit   Allergies: 1)  ! * Ace Inhibitors/ Lisinopril  Medication Administration  Injection # 1:    Medication: Xolair (omalizumab) 150mg     Diagnosis: EXTRINSIC ASTHMA, UNSPECIFIED (ICD-493.00)    Route: IM    Site: R deltoid    Exp Date: 01/27/2013    Lot #: [id number]    Mfr: Genetech    Comments: xolair 225, 60 units, 0.9 ml x 1 in left deltoid and 0.9 ml x 1 in right deltoid. pt didn't wait.  .    Given by: Dimas Millin, Allergy tech  Orders Added: 1)  Administration xolair injection [phone]

## 2010-05-29 NOTE — Progress Notes (Signed)
Summary: appt-LMTCBx1  Phone Note Call from Patient Call back at Home Phone [phone]   Caller: Patient Call For: young Summary of Call: Pt states she went to [facility] out patient clinic this morning, wants to know if she has to keep her appt this morning, pls advise. Initial call taken by: Darletta Moll,  March 08, 2010 10:11 AM  Follow-up for Phone Call        according to last phone note CY wants pt to keep appt. I LMTCBx1 to advise pt. Carron Curie CMA  March 08, 2010 10:17 AM    pt cancelled appt with CY and rescheduled to Jan [address] [facility]  March 09, 2010 3:21 PM

## 2010-05-29 NOTE — Letter (Signed)
Summary: [employer] [facility]  [name]   [place], [place] [address]   Phone: [phone]  Fax: [phone]    01/17/2010  April [name] [address] [place], [place]  [address]  To Whom it may concern:     Please allow the above named patient to bring and use her nebulizer and medications as needed while at work. If any questions or concerns please feel free to call our office at [phone].       Sincerely,      Jason Coop

## 2010-05-29 NOTE — Progress Notes (Signed)
Summary: phone/gg  Phone Note Call from Patient   Summary of Call: Pt c/o chest congestion, SOB when she first gets up. SHe called Dr Maple Hudson at [facility] but they can't see her today. Appointment given for tomorrow and pt instructed to go to ED if increase SOB. Patient/caller verbalizes understanding of these instructions.  Initial call taken by: Merrie Roof RN,  March 07, 2010 10:01 AM

## 2010-05-29 NOTE — Assessment & Plan Note (Signed)
Summary: xolair/ mbw  Nurse Visit   Allergies: No Known Drug Allergies  Medication Administration  Injection # 1:    Medication: Xolair (omalizumab) 150mg     Diagnosis: EXTRINSIC ASTHMA, UNSPECIFIED (ICD-493.00)    Route: SQ    Site: L deltoid    Exp Date: 08/27/2012    Lot #: [id number]    Mfr: GENENTECH    Comments: 0.9 ML IN LEFT AND 0.9 ML IN RIGHT ARM PT DIDNT WAIT    Given by: TAMMY SCOTT IN [facility]  Orders Added: 1)  Administration xolair injection [id number]   Medication Administration  Injection # 1:    Medication: Xolair (omalizumab) 150mg     Diagnosis: EXTRINSIC ASTHMA, UNSPECIFIED (ICD-493.00)    Route: SQ    Site: L deltoid    Exp Date: 08/27/2012    Lot #: [id number]    Mfr: GENENTECH    Comments: 0.9 ML IN LEFT AND 0.9 ML IN RIGHT ARM PT DIDNT WAIT    Given by: TAMMY SCOTT IN [facility]  Orders Added: 1)  Administration xolair injection [phone]

## 2010-05-29 NOTE — Assessment & Plan Note (Signed)
Summary: poss. anxiety attacks/pcp-madera/hla   Vital Signs:  Patient profile:   40 year old female Height:      65 inches Weight:      338.2 pounds BMI:     56.48 Temp:     97.2 degrees F oral Pulse rate:   78 / minute BP sitting:   156 / 102  (right arm)  Vitals Entered By: Stanton Kidney Ditzler RN (October 06, 2009 10:34 AM) Is Patient Diabetic? No Pain Assessment Patient in pain? no      Nutritional Status BMI of > 30 = obese Nutritional Status Detail appetite good  Have you ever been in a relationship where you felt threatened, hurt or afraid?denies   Does patient need assistance? Functional Status Self care Ambulation Normal Comments ? anxiety attacks - occ takes energy pill. Heart racing. Ulcer upper right lip. Under a lot stress. Refills on meds.Stockings for legs.   Primary Care Provider:  Valetta Close MD   History of Present Illness: Ms. April [name] comes today for the followings:  1. HTN: She has not been taking her HCTZ and lisinopril for about a week.   2. Asthma/OSA: It is stable with her inhalers. She has not ever done a sleep study.   3. GERD: It is getting worse and she is going to pick her medicine.   4. Insomnia: It is OK now.   5. Left knee pain: It was getting worse, but it fluctuates day to day. She states that vicodin helps for the pain and the gel does not help. SHe had some steroid injections on her knees, but she was told that she cannot have more frequent steroid injections.   Depression History:      The patient denies a depressed mood most of the day and a diminished interest in her usual daily activities.         Preventive Screening-Counseling & Management  Alcohol-Tobacco     Smoking Status: quit     Year Quit: 2008     Passive Smoke Exposure: no  Caffeine-Diet-Exercise     Does Patient Exercise: yes     Type of exercise: WALKING  Current Medications (verified): 1)  Omeprazole 40 Mg Cpdr (Omeprazole) .[name].. 1 By Mouth Two Times A Day 2)   Proair Hfa 108 (90 Base) Mcg/act Aers (Albuterol Sulfate) .... 2 Puffs Four Times A Day As Needed 3)  Hydrochlorothiazide 25 Mg Tabs (Hydrochlorothiazide) .... One Tablet By Mouth Daily 4)  Flonase 50 Mcg/act Susp (Fluticasone Propionate) .... Take 2 Sprays in Each Nostril Daily For 3 Days, Then One Spray in Each Nostil Daily For 2 Weeks 5)  Claritin 10 Mg Tabs (Loratadine) .... Take 1 Tablet By Mouth Once A Day 6)  Albuterol Sulfate (2.5 Mg/57ml) 0.083% Nebu (Albuterol Sulfate) .[name].. 1 Neb Four Times A Day As Needed 7)  Atrovent Hfa 17 Mcg/act Aers (Ipratropium Bromide Hfa) .... 2 Puffs . Qid As Needed Rescue 8)  Dulera 200-5 Mcg/act Aero (Mometasone Furo-Formoterol Fum) .... Use Two Times A Day For Asthma. 9)  Lisinopril 20 Mg Tabs (Lisinopril) .... Take One Tablet Daily For Blood Pressure. 10)  Epipen 0.3 Mg/0.80ml Devi (Epinephrine) .... Use As Directed As Needed Severe Allergic Reaction 11)  Xolair 150 Mg Solr (Omalizumab) .... 300mg  Every 2 Weeks 12)  Ambien 10 Mg Tabs (Zolpidem Tartrate) .... Take 1 By Mouth At Bedtime  Allergies: No Known Drug Allergies  Review of Systems      See HPI  Physical Exam  Mouth:  pharynx pink and moist.   Lungs:  normal breath sounds, no crackles, and no wheezes.   Heart:  normal rate, regular rhythm, and no murmur.   Msk:  Left knee: No swelling, redness, deformity or tenderness.  Extremities:  trace left pedal edema and trace right pedal edema.     Impression & Recommendations:  Problem # 1:  EXTRINSIC ASTHMA, UNSPECIFIED (ICD-493.00) Stable with her inhalers and f/b Dr. Maple Hudson.  Her updated medication list for this problem includes:    Proair Hfa 108 (90 Base) Mcg/act Aers (Albuterol sulfate) .[name]... 2 puffs four times a day as needed    Albuterol Sulfate (2.5 Mg/83ml) 0.083% Nebu (Albuterol sulfate) .[name]... 1 neb four times a day as needed    Atrovent Hfa 17 Mcg/act Aers (Ipratropium bromide hfa) .[name]... 2 puffs . qid as needed rescue    Dulera 200-5  Mcg/act Aero (Mometasone furo-formoterol fum) ..... Use two times a day for asthma.    Xolair 150 Mg Solr (Omalizumab) ..... 300mg  every 2 weeks  Problem # 2:  KNEE PAIN, LEFT (ICD-719.46) From OA. SHe asks for vicodin. I saw her PCP gave some in 2/11 and it seems like she had some cortisone injections as well. I will defer whether she should get narcotics like vicodin to her PCP. WIll inform him.   Problem # 3:  HYPERTENSION, ESSENTIAL NOS (ICD-401.9) BP elevated today as she is not taking her meds as they ran out. Will refill, check following and f/u in 2wks.   Her updated medication list for this problem includes:    Hydrochlorothiazide 25 Mg Tabs (Hydrochlorothiazide) ..... One tablet by mouth daily    Lisinopril 20 Mg Tabs (Lisinopril) .[name]... Take one tablet daily for blood pressure.  Orders: T-Comprehensive Metabolic Panel [phone]) T-T4, Free [phone]) T-TSH [phone]) T-Lipid Profile [phone])  BP today: 156/102 Prior BP: 122/80 (08/11/2009)  Labs Reviewed: K+: 4.3 (04/17/2009) Creat: : 0.61 (04/17/2009)   Chol: 179 (04/17/2009)   HDL: 63 (04/17/2009)   LDL: 93 (04/17/2009)   TG: 114 (04/17/2009)  Problem # 4:  INSOMNIA (ICD-780.52) Controlled with ambien, not taking restoril.  The following medications were removed from the medication list:    Restoril 7.5 Mg Caps (Temazepam) .[name]... Take 1 tab by mouth at bedtime Her updated medication list for this problem includes:    Ambien 10 Mg Tabs (Zolpidem tartrate) .[name]... Take 1 by mouth at bedtime  Problem # 5:  SLEEP APNEA (ICD-780.57) Has not gotten her sleep study yet, but will plan to do it later.   Problem # 6:  GASTROESOPHAGEAL REFLUX DISEASE, CHRONIC (ICD-530.81) Worsened recently as she was not taking her PPI, will take soon.  Her updated medication list for this problem includes:    Omeprazole 40 Mg Cpdr (Omeprazole) .[name]... 1 by mouth two times a day  Complete Medication List: 1)  Omeprazole 40 Mg Cpdr  (Omeprazole) .[name].. 1 by mouth two times a day 2)  Proair Hfa 108 (90 Base) Mcg/act Aers (Albuterol sulfate) .... 2 puffs four times a day as needed 3)  Hydrochlorothiazide 25 Mg Tabs (Hydrochlorothiazide) .... One tablet by mouth daily 4)  Flonase 50 Mcg/act Susp (Fluticasone propionate) .... Take 2 sprays in each nostril daily for 3 days, then one spray in each nostil daily for 2 weeks 5)  Claritin 10 Mg Tabs (Loratadine) .... Take 1 tablet by mouth once a day 6)  Albuterol Sulfate (2.5 Mg/72ml) 0.083% Nebu (Albuterol sulfate) .[name].. 1 neb four times a day as needed  7)  Atrovent Hfa 17 Mcg/act Aers (Ipratropium bromide hfa) .... 2 puffs . qid as needed rescue 8)  Dulera 200-5 Mcg/act Aero (Mometasone furo-formoterol fum) .... Use two times a day for asthma. 9)  Lisinopril 20 Mg Tabs (Lisinopril) .... Take one tablet daily for blood pressure. 10)  Epipen 0.3 Mg/0.36ml Devi (Epinephrine) .... Use as directed as needed severe allergic reaction 11)  Xolair 150 Mg Solr (Omalizumab) .... 300mg  every 2 weeks 12)  Ambien 10 Mg Tabs (Zolpidem tartrate) .... Take 1 by mouth at bedtime  Patient Instructions: 1)  Please schedule a follow-up appointment in 2 weeks. 2)  Limit your Sodium (Salt) to less than 2 grams a day(slightly less than 1/2 a teaspoon) to prevent fluid retention, swelling, or worsening of symptoms. 3)  It is important that you exercise regularly at least 20 minutes 5 times a week. If you develop chest pain, have severe difficulty breathing, or feel very tired , stop exercising immediately and seek medical attention. 4)  You need to lose weight. Consider a lower calorie diet and regular exercise.  Prescriptions: LISINOPRIL 20 MG TABS (LISINOPRIL) Take one tablet daily for blood pressure.  #30 x 2   Entered and Authorized by:   Jason Coop MD   Signed by:   Jason Coop MD on 10/06/2009   Method used:   Electronically to        [facility] [phone]* (retail)        [address]       [place], [place]  [address]       Ph: [phone]       Fax: [phone]   RxID:   [id number] HYDROCHLOROTHIAZIDE 25 MG TABS (HYDROCHLOROTHIAZIDE) one tablet by mouth daily  #30 x 2   Entered and Authorized by:   Jason Coop MD   Signed by:   Jason Coop MD on 10/06/2009   Method used:   Electronically to        [facility] [phone]* (retail)       [address]       [place], [place]  [address]       Ph: [phone]       Fax: [phone]   RxID:   [id number]  Process Orders Check Orders Results:     [facility]: ABN not required for this insurance Tests Sent for requisitioning (October 06, 2009 1:13 PM):     10/06/2009: [facility] -- T-Comprehensive Metabolic Panel [[id number]] (signed)     10/06/2009: [facility] -- [place], [place] [[id number]] (signed)     10/06/2009: [facility] -- T-TSH [phone] (signed)     10/06/2009: [facility] -- T-Lipid Profile [phone] (signed)    Prevention & Chronic Care Immunizations   Influenza vaccine: Fluvax 3+  (03/03/2009)    Tetanus booster: Not documented    Pneumococcal vaccine: Pneumovax  (03/17/2009)  Other Screening   Pap smear: Not documented   Pap smear action/deferral: Deferred  (04/17/2009)   Smoking status: quit  (10/06/2009)  Lipids   Total Cholesterol: 179  (04/17/2009)   Lipid panel action/deferral: Lipid Panel ordered   LDL: 93  (04/17/2009)   LDL Direct: Not documented   HDL: 63  (04/17/2009)   Triglycerides: 114  (04/17/2009)  Hypertension   Last Blood Pressure: 156 / 102  (10/06/2009)   Serum creatinine: 0.61  (04/17/2009)   Serum potassium 4.3  (04/17/2009) CMP ordered   Self-Management Support :   Personal Goals (  by the next clinic visit) :      Personal blood pressure goal: 140/90  (01/18/2009)   Patient will work on the following items until the next clinic visit to reach  self-care goals:     Medications and monitoring: take my medicines every day, check my blood pressure, bring all of my medications to every visit  (10/06/2009)     Eating: eat more vegetables, use fresh or frozen vegetables, eat foods that are low in salt, eat baked foods instead of fried foods, eat fruit for snacks and desserts, limit or avoid alcohol  (10/06/2009)     Activity: take a 30 minute walk every day, park at the far end of the parking lot  (10/06/2009)    Hypertension self-management support: Written self-care plan, Education handout, Resources for patients handout  (10/06/2009)   Hypertension self-care plan printed.   Hypertension education handout printed      Resource handout printed.  Process Orders Check Orders Results:     [facility]: ABN not required for this insurance Tests Sent for requisitioning (October 06, 2009 1:13 PM):     10/06/2009: [facility] -- T-Comprehensive Metabolic Panel [[id number]] (signed)     10/06/2009: [facility] -- [place], [place] [[id number]] (signed)     10/06/2009: [facility] -- T-TSH [phone] (signed)     10/06/2009: [facility] -- T-Lipid Profile [phone] (signed)

## 2010-05-29 NOTE — Assessment & Plan Note (Signed)
Summary: HFU/SB.   Vital Signs:  Patient profile:   40 year old female Height:      66 inches (167.64 cm) Weight:      335.4 pounds (152.45 kg) BMI:     54.33 O2 Sat:      99 % on Room air Temp:     98.9 degrees F (37.17 degrees C) oral Pulse rate:   92 / minute Pulse (ortho):   97 / minute BP sitting:   134 / 84  (left arm) BP standing:   122 / 81  Vitals Entered By: Stanton Kidney Ditzler RN (March 14, 2010 2:46 PM)  O2 Flow:  Room air  Serial Vital Signs/Assessments:  Time      Position  BP       Pulse  Resp  Temp     By 3:15PM    Lying LA  108/66   91                    Debra Ditzler RN 3:15PM    Sitting   124/84   93                    Debra Ditzler RN 3:15PM    Standing  122/81   97                    Debra Ditzler RN  Is Patient Diabetic? No Pain Assessment Patient in pain? yes     Location: left knee Intensity: 10+ Type: popping Onset of pain  past week Nutritional Status BMI of > 30 = obese Nutritional Status Detail appetite good  Have you ever been in a relationship where you felt threatened, hurt or afraid?denies   Does patient need assistance? Functional Status Self care Ambulation Normal Comments HFU and ck left knee, cold sore right upper lip, h/a past mont and Ambien not working.   Primary Care Provider:  Vassie Loll MD   History of Present Illness: April [name] is a 40yo W who presents today for follow-up of: 1) asthma. Seen last week after being discharged from the hospital for asthma exacerbation. Was still having significant shortness of breath at that time and was started on a slow prednisone taper which she is still taking (currently taking 30mg  daily). She feels much improved. She denies any further shortness of breath or wheezing. However, she reports using her Atrovent inhaler 3-4 times per day when she is resting at home. When she is at work, she doesn't notice her asthma and does not use the inhalers.  2) headache. She has had recurrent headache  off and on in the past. Most recently, she has been having headaches daily for the past three weeks, corresponding approximately to the beginning of the most recent asthma exacerbation. These headaches do not occur at any particular time of day and are not associated with vision changes. However, twice while having these headaches, she felt dizzy and off balance, feeling that the room was spinning, after standing up. She has been taking ibuprofen for these headaches without relief. She believes that these headaches may be related to the poor sleep that she has been getting over the past 3+ weeks. 3) Insomnia. Has been taking Ambien but still sleeping poorly. She has no difficulty falling asleep but wakes up frequently throughout the night, never getting more than 1.5-2 hours of sleep at a time. She denies waking up gasping or nocturia. She says that she had a sleep  study a few years ago and was told that she did not require CPAP at that time.  4) Osteoarthritis. Complains of increasing arthritic pain and popping in L knee for which she takes ibuprofen or naproxen. Denies swelling, tenderness, or warmth of joints. Has tried to lose weight with little success.  5) Cold sore. She reports a new cold sore that appeared on her R vermilion border in the last 24 hours and she would like a prescription to treat it.   Depression History:      The patient denies a depressed mood most of the day and a diminished interest in her usual daily activities.         Preventive Screening-Counseling & Management  Alcohol-Tobacco     Smoking Status: quit     Year Quit: 2008     Passive Smoke Exposure: no  Caffeine-Diet-Exercise     Does Patient Exercise: yes     Type of exercise: WALKING  Current Medications (verified): 1)  Diovan 160 Mg Tabs (Valsartan) .[name].. 1 By Mouth Once Daily 2)  Omeprazole 40 Mg Cpdr (Omeprazole) .[name].. 1 By Mouth Two Times A Day 3)  Hydrochlorothiazide 25 Mg Tabs (Hydrochlorothiazide) .... One  Tablet By Mouth Daily 4)  Flonase 50 Mcg/act Susp (Fluticasone Propionate) .... Take 2 Sprays in Each Nostril Daily For 3 Days, Then One Spray in Each Nostil Daily For 2 Weeks 5)  Claritin 10 Mg Tabs (Loratadine) .... Take 1 Tablet By Mouth Once A Day 6)  Albuterol Sulfate (2.5 Mg/23ml) 0.083% Nebu (Albuterol Sulfate) .[name].. 1 Neb Four Times A Day As Needed 7)  Atrovent Hfa 17 Mcg/act Aers (Ipratropium Bromide Hfa) .... 2 Puffs . Qid As Needed Rescue 8)  Dulera 200-5 Mcg/act Aero (Mometasone Furo-Formoterol Fum) .... Use Two Times A Day For Asthma. 9)  Epipen 0.3 Mg/0.67ml Devi (Epinephrine) .... Use As Directed As Needed Severe Allergic Reaction 10)  Xolair 150 Mg Solr (Omalizumab) .... 300mg  Every 2 Weeks 11)  Ambien 10 Mg Tabs (Zolpidem Tartrate) .... Take 1 By Mouth At Bedtime As Needed To Fall Sleep. 12)  Proair Hfa 108 (90 Base) Mcg/act Aers (Albuterol Sulfate) .... 2 Puffs Four Times A Day As Needed 13)  Prednisone 10 Mg Tabs (Prednisone) .... Take 4 Tabs Daily For 5 Days By Mouth, 3 Tabs Daily For 5 Days, 2 Tabs Daily For 5 Days, 1 Tab Daily For 5 Days and Stop. 14)  Avelox 400 Mg Tabs (Moxifloxacin Hcl) .[name].. 1 Daily 15)  Ambien 10 Mg Tabs (Zolpidem Tartrate) .... Take 1 Tablet By Mouth At Bedtime As Needed To Fall Asleep 16)  Claritin 10 Mg Tabs (Loratadine) .... Take 1 Tablet By Mouth Once A Day 17)  Duoneb 0.5-2.5 (3) Mg/6ml Soln (Ipratropium-Albuterol) 18)  Ipratropium Bromide 0.02 % Soln (Ipratropium Bromide) .... Mix With Albuterol in Neubulizer and Use Within 30 Mins.  Use 4 Times Daily As Needed For Sob. 19)  Ibuprofen 400 Mg Tabs (Ibuprofen) .... Take One Tab By Mouth Every 6 Hours As Needed For Headache 20)  Acyclovir 400 Mg Tabs (Acyclovir) .... Take 1 Tablet By Mouth Three Times A Day For 5 Days  Allergies: 1)  ! * Ace Inhibitors/ Lisinopril  Past History:  Past Medical History: Last updated: 02/01/2009 ACANTHOSIS NIGRICANS (ICD-701.2) FOOT PAIN, RIGHT  (ICD-729.5) MENORRHAGIA (ICD-626.2) ASTHMA, EXTRINSIC W/ACUTE EXACERBATION (ICD-493.02) HYPERTENSION, ESSENTIAL NOS (ICD-401.9) DENTAL PAIN (ICD-525.9) INSOMNIA (ICD-780.52) OBESITY, MORBID (ICD-278.01) ABDOMINAL PAIN (ICD-789.00) HELICOBACTER PYLORI INFECTION (ICD-041.86) SLEEP APNEA (ICD-780.57) COPD (ICD-496) ASTHMA (ICD-493.90)-  Skin test 02/01/09 Pos Hemorrhoids. DEPRESSION (ICD-311) GERD (ICD-530.81) WRIST PAIN, RIGHT (ICD-719.43) TOBACCO USER (ICD-305.1) TUBAL LIGATION, HX OF (ICD-V26.51)  Family History: Last updated: 12/16/2008 daughter, grandmother, neice- asthma Aunt died- lung cancer  Social History: Last updated: 12/16/2008 Daughter in [employer] Not married Former smoker. 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  Review of Systems      See HPI General:  Complains of sleep disorder; denies chills, fever, loss of appetite, and weight loss. Eyes:  Denies blurring. ENT:  Denies sore throat. CV:  Denies chest pain or discomfort, difficulty breathing at night, and swelling of feet. Resp:  Denies cough, morning headaches, shortness of breath, and wheezing. GI:  Denies abdominal pain and change in bowel habits. MS:  Complains of joint pain. Derm:  Complains of lesion(s). Neuro:  Complains of headaches and sensation of room spinning; denies falling down and visual disturbances. Endo:  Denies excessive thirst, excessive urination, and weight change.  Physical Exam  General:  alert, cooperative to examination, and overweight-appearing.   Head:  normocephalic and atraumatic.   Eyes:  vision grossly intact, pupils equal, pupils round, and pupils reactive to light.   Ears:  R ear normal and L ear normal.   Mouth:  pharynx pink and moist.   Neck:  supple and no masses.   Lungs:  normal respiratory effort, normal breath sounds, no crackles, and no wheezes.    Heart:  normal rate, regular rhythm, no murmur, and no gallop.   Abdomen:  soft and non-tender.   Msk:  normal ROM, no joint tenderness, no joint swelling, and no joint warmth.   Pulses:  R dorsalis pedis normal and L dorsalis pedis normal.   Extremities:  No edema, cyanosis, or clubbing.  Neurologic:  alert & oriented X3.   Psych:  memory intact for recent and remote, good eye contact, not anxious appearing, and not depressed appearing.     Impression & Recommendations:  Problem # 1:  ASTHMA, EXTRINSIC W/ACUTE EXACERBATION (ICD-493.02) Much improved. No wheezes on exam. Still using her Atrovent inhaler frequently, although she does not notice any overt wheezes. Reinforced correct use of inhalers and instructed patient to complete prednisone taper.   Her updated medication list for this problem includes:    Albuterol Sulfate (2.5 Mg/68ml) 0.083% Nebu (Albuterol sulfate) .[name]... 1 neb four times a day as needed    Atrovent Hfa 17 Mcg/act Aers (Ipratropium bromide hfa) .[name]... 2 puffs . qid as needed rescue    Dulera 200-5 Mcg/act Aero (Mometasone furo-formoterol fum) ..... Use two times a day for asthma.    Xolair 150 Mg Solr (Omalizumab) ..... 300mg  every 2 weeks    Proair Hfa 108 (90 Base) Mcg/act Aers (Albuterol sulfate) .[name]... 2 puffs four times a day as needed    Prednisone 10 Mg Tabs (Prednisone) .[name]... Take 4 tabs daily for 5 days by mouth, 3 tabs daily for 5 days, 2 tabs daily for 5 days, 1 tab daily for 5 days and stop.    Duoneb 0.5-2.5 (3) Mg/6ml Soln (Ipratropium-albuterol)    Ipratropium Bromide 0.02 % Soln (Ipratropium bromide) ..... Mix with albuterol in neubulizer and use within 30 mins.  use 4 times daily as needed for sob.  Problem # 2:  SLEEP APNEA (ICD-780.57) Reviewed records from 2008 sleep study. Study at that time demonstrated mild sleep apnea that was not severe  enough to warrant CPAP use. She has twice been referred back to the sleep center for repeat study but has not  gone. I am concerned that her increased difficulty sleeping and persistent headaches could represent worsened sleep apnea. She is obese, although she has lost approximately 15 lbs since 2008. Will refer her again for sleep study and have discussed the importance of this follow-up study with the patient.   Orders: Sleep Disorder Referral (Sleep Disorder)  Problem # 3:  KNEE PAIN, LEFT (ICD-719.46) No effusion, warmth, or erythema. Counseled patient on weight loss to prevent further progression of arthritis. Continue ibuprofen for pain management.   Her updated medication list for this problem includes:    Ibuprofen 400 Mg Tabs (Ibuprofen) .[name]... Take one tab by mouth every 6 hours as needed for headache  Problem # 4:  HEADACHE (ICD-784.0) Agree with patient that these headaches could be related to poor sleep. Encouraged repeat sleep study as discussed above. The etiology of these two episodes of dizziness after standing up is unclear. Orthostatics are normal. Continue ibuprofen for pain management. Will re-evaluate at follow-up visit.   Her updated medication list for this problem includes:    Ibuprofen 400 Mg Tabs (Ibuprofen) .[name]... Take one tab by mouth every 6 hours as needed for headache  Problem # 5:  COLD SORE (ICD-054.9) Since she presented within 24 hours of symptom onset, prescribed 5 day course of acyclovir.   Complete Medication List: 1)  Diovan 160 Mg Tabs (Valsartan) .[name].. 1 by mouth once daily 2)  Omeprazole 40 Mg Cpdr (Omeprazole) .[name].. 1 by mouth two times a day 3)  Hydrochlorothiazide 25 Mg Tabs (Hydrochlorothiazide) .... One tablet by mouth daily 4)  Flonase 50 Mcg/act Susp (Fluticasone propionate) .... Take 2 sprays in each nostril daily for 3 days, then one spray in each nostil daily for 2 weeks 5)  Claritin 10 Mg Tabs (Loratadine) .... Take 1 tablet by mouth once a day 6)  Albuterol Sulfate (2.5 Mg/21ml) 0.083% Nebu (Albuterol sulfate) .[name].. 1 neb four times a day as  needed 7)  Atrovent Hfa 17 Mcg/act Aers (Ipratropium bromide hfa) .... 2 puffs . qid as needed rescue 8)  Dulera 200-5 Mcg/act Aero (Mometasone furo-formoterol fum) .... Use two times a day for asthma. 9)  Epipen 0.3 Mg/0.66ml Devi (Epinephrine) .... Use as directed as needed severe allergic reaction 10)  Xolair 150 Mg Solr (Omalizumab) .... 300mg  every 2 weeks 11)  Ambien 10 Mg Tabs (Zolpidem tartrate) .... Take 1 by mouth at bedtime as needed to fall sleep. 12)  Proair Hfa 108 (90 Base) Mcg/act Aers (Albuterol sulfate) .... 2 puffs four times a day as needed 13)  Prednisone 10 Mg Tabs (Prednisone) .... Take 4 tabs daily for 5 days by mouth, 3 tabs daily for 5 days, 2 tabs daily for 5 days, 1 tab daily for 5 days and stop. 14)  Avelox 400 Mg Tabs (Moxifloxacin hcl) .[name].. 1 daily 15)  Ambien 10 Mg Tabs (Zolpidem tartrate) .... Take 1 tablet by mouth at bedtime as needed to fall asleep 16)  Claritin 10 Mg Tabs (Loratadine) .... Take 1 tablet by mouth once a day 17)  Duoneb 0.5-2.5 (3) Mg/50ml Soln (Ipratropium-albuterol) 18)  Ipratropium Bromide 0.02 % Soln (Ipratropium bromide) .... Mix with albuterol in neubulizer and use within 30 mins.  use 4 times daily as needed for sob. 19)  Ibuprofen 400 Mg Tabs (Ibuprofen) .... Take one tab by mouth every 6 hours as needed for  headache 20)  Acyclovir 400 Mg Tabs (Acyclovir) .... Take 1 tablet by mouth three times a day for 5 days  Patient Instructions: 1)  Please schedule a follow-up appointment in 2 months. 2)  It is important that you exercise regularly at least 20 minutes 5 times a week. If you develop chest pain, have severe difficulty breathing, or feel very tired , stop exercising immediately and seek medical attention. 3)  You need to lose weight. Consider a lower calorie diet and regular exercise.  Prescriptions: ACYCLOVIR 400 MG TABS (ACYCLOVIR) Take 1 tablet by mouth three times a day for 5 days  #30 x 0   Entered and Authorized by:   April Post MD   Signed by:   April Post MD on 03/14/2010   Method used:   Electronically to        [facility] [phone]* (retail)       [address]       [place], [place]  [address]       Ph: [phone]       Fax: [phone]   RxID:   [id number]    Orders Added: 1)  Sleep Disorder Referral [Sleep Disorder] 2)  Est. Patient Level IV [24401]     Prevention & Chronic Care Immunizations   Influenza vaccine: Fluvax MCR  (12/27/2009)    Tetanus booster: Not documented    Pneumococcal vaccine: Pneumovax  (03/17/2009)  Other Screening   Pap smear: Not documented   Pap smear action/deferral: Deferred  (04/17/2009)   Smoking status: quit  (03/14/2010)  Lipids   Total Cholesterol: 183  (10/06/2009)   Lipid panel action/deferral: Lipid Panel ordered   LDL: 123  (10/06/2009)   LDL Direct: Not documented   HDL: 47  (10/06/2009)   Triglycerides: 67  (10/06/2009)  Hypertension   Last Blood Pressure: 134 / 84  (03/14/2010)   Serum creatinine: 0.65  (03/08/2010)   Serum potassium 4.0  (03/08/2010)    Hypertension flowsheet reviewed?: Yes   Progress toward BP goal: Unchanged  Self-Management Support :   Personal Goals (by the next clinic visit) :      Personal blood pressure goal: 140/90  (01/18/2009)   Patient will work on the following items until the next clinic visit to reach self-care goals:     Medications and monitoring: take my medicines every day, bring all of my medications to every visit  (03/14/2010)     Eating: drink diet soda or water instead of juice or soda, eat more vegetables, use fresh or frozen vegetables, eat foods that are low in salt, eat baked foods instead of fried foods, eat fruit for snacks and desserts, limit or avoid alcohol  (03/14/2010)     Activity: take a 30 minute walk every day  (03/14/2010)    Hypertension self-management support: Written self-care plan, Education handout, Resources for patients handout  (03/14/2010)    Hypertension self-care plan printed.   Hypertension education handout printed      Resource handout printed.

## 2010-05-29 NOTE — Assessment & Plan Note (Signed)
Summary: Acute NP office visit - asthma   Copy to:  [facility] Primary Provider/Referring Provider:  Valetta Close MD  CC:  increased SOB, wheezing, dry cough, and tightness/scratchiness in throat x3days  .  History of Present Illness:  April 18, 2009- Asthma, rhinitis Wakes up with heartburn and wheezing. She is on omeprazole twice daily before meals. At home she needs to use her Atrovent more than 4 times daily. We reviewed allergy records- IgE 139.4 on 12/16/08. We spent 20 minutes today discussing Xolair, risks, benefits, goals and guidelines.  June 13, 2009- Asthma, rhintis Asthma flare yestrerday- unknown trigger at work. Stayed at work. She has noted some shoulder pain on left side, above where she gets Xolair inj. She gets Xolair inj in both arms each time and doesn't notice this pain in the right arm. She thinks Xolair may be helping asthma on balance, but it is eatrly still. I again explained Xolair would only help allergic asthma- not viruses, stress, reflux or irritant exposures. She still has [employer]. She asks about continuing Nasonex and we discused antihistamines. Plans to stay out of work today.  August 11, 2009- Asthma, rhinitis She has not had her sleep study. She feels she can't leave her teenage daughters at home, so this will not be done for now. Sleeping some better. She wakes some. Not snoring as badly now. Asthma is doing much better with Xolair. She is vey pleased that she hasn't had to go to the hospital at all. Her meds control mild morning wheeze. We discussed Xolair again.  November 07, 2009--Presents for an acute office visit. Complains of increased SOB, wheezing, dry cough, tightness/scratchiness in throat x3days. Lots of nasal congestion, stuffiness, post nasal drip. Complains  of recurrent dry cough, tickle in throat, wheezing w/ tightness. Worse for last 3 days. Was seen in ER couple of months ago w/ similar symptoms. CXR in March of  this year was w/ no acute findings. Denies chest pain,  orthopnea, hemoptysis, fever, n/v/d, edema, headache.   Medications Prior to Update: 1)  Omeprazole 40 Mg Cpdr (Omeprazole) .[name].. 1 By Mouth Two Times A Day 2)  Hydrochlorothiazide 25 Mg Tabs (Hydrochlorothiazide) .... One Tablet By Mouth Daily 3)  Flonase 50 Mcg/act Susp (Fluticasone Propionate) .... Take 2 Sprays in Each Nostril Daily For 3 Days, Then One Spray in Each Nostil Daily For 2 Weeks 4)  Claritin 10 Mg Tabs (Loratadine) .... Take 1 Tablet By Mouth Once A Day 5)  Albuterol Sulfate (2.5 Mg/23ml) 0.083% Nebu (Albuterol Sulfate) .[name].. 1 Neb Four Times A Day As Needed 6)  Atrovent Hfa 17 Mcg/act Aers (Ipratropium Bromide Hfa) .... 2 Puffs . Qid As Needed Rescue 7)  Dulera 200-5 Mcg/act Aero (Mometasone Furo-Formoterol Fum) .... Use Two Times A Day For Asthma. 8)  Lisinopril 20 Mg Tabs (Lisinopril) .... Take One Tablet Daily For Blood Pressure. 9)  Epipen 0.3 Mg/0.67ml Devi (Epinephrine) .... Use As Directed As Needed Severe Allergic Reaction 10)  Xolair 150 Mg Solr (Omalizumab) .... 300mg  Every 2 Weeks 11)  Ambien 10 Mg Tabs (Zolpidem Tartrate) .... Take 1 By Mouth At Bedtime 12)  Proair Hfa 108 (90 Base) Mcg/act Aers (Albuterol Sulfate) .... 2 Puffs Four Times A Day As Needed  Current Medications (verified): 1)  Omeprazole 40 Mg Cpdr (Omeprazole) .[name].. 1 By Mouth Two Times A Day 2)  Proair Hfa 108 (90 Base) Mcg/act Aers (Albuterol Sulfate) .... 2 Puffs Four Times A Day As Needed 3)  Hydrochlorothiazide 25 Mg Tabs (Hydrochlorothiazide) .... One Tablet By Mouth Daily 4)  Flonase 50 Mcg/act Susp (Fluticasone Propionate) .... Take 2 Sprays in Each Nostril Daily For 3 Days, Then One Spray in Each Nostil Daily For 2 Weeks 5)  Claritin 10 Mg Tabs (Loratadine) .... Take 1 Tablet By Mouth Once A Day 6)  Albuterol Sulfate (2.5 Mg/61ml) 0.083% Nebu (Albuterol Sulfate) .[name].. 1 Neb Four Times A Day As Needed 7)  Atrovent Hfa 17 Mcg/act Aers  (Ipratropium Bromide Hfa) .... 2 Puffs . Qid As Needed Rescue 8)  Dulera 200-5 Mcg/act Aero (Mometasone Furo-Formoterol Fum) .... Use Two Times A Day For Asthma. 9)  Lisinopril 20 Mg Tabs (Lisinopril) .... Take One Tablet Daily For Blood Pressure. 10)  Epipen 0.3 Mg/0.70ml Devi (Epinephrine) .... Use As Directed As Needed Severe Allergic Reaction 11)  Xolair 150 Mg Solr (Omalizumab) .... 300mg  Every 2 Weeks 12)  Ambien 10 Mg Tabs (Zolpidem Tartrate) .... Take 1 By Mouth At Bedtime  Allergies (verified): No Known Drug Allergies  Past History:  Past Medical History: Last updated: 02/01/2009 ACANTHOSIS NIGRICANS (ICD-701.2) FOOT PAIN, RIGHT (ICD-729.5) MENORRHAGIA (ICD-626.2) ASTHMA, EXTRINSIC W/ACUTE EXACERBATION (ICD-493.02) HYPERTENSION, ESSENTIAL NOS (ICD-401.9) DENTAL PAIN (ICD-525.9) INSOMNIA (ICD-780.52) OBESITY, MORBID (ICD-278.01) ABDOMINAL PAIN (ICD-789.00) HELICOBACTER PYLORI INFECTION (ICD-041.86) SLEEP APNEA (ICD-780.57) COPD (ICD-496) ASTHMA (ICD-493.90)- Skin test 02/01/09 Pos Hemorrhoids. DEPRESSION (ICD-311) GERD (ICD-530.81) WRIST PAIN, RIGHT (ICD-719.43) TOBACCO USER (ICD-305.1) TUBAL LIGATION, HX OF (ICD-V26.51)  Past Surgical History: Last updated: 04/10/2006 Tubal ligation, bilateral, 1996  Family History: Last updated: 12/16/2008 daughter, grandmother, neice- asthma Aunt died- lung cancer  Social History: Last updated: 12/16/2008 Daughter in [employer] Not married Former smoker. 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  Risk Factors: Exercise: yes (10/06/2009)  Risk Factors: Smoking Status: quit (10/06/2009) Passive Smoke Exposure: no (10/06/2009)  Review of Systems      See HPI  Vital Signs:  Patient profile:   40 year old female Height:      65 inches Weight:      340.13 pounds BMI:     56.81 O2 Sat:      97 % on Room  air Temp:     98.2 degrees F oral Pulse rate:   83 / minute BP sitting:   116 / 66  (left arm) Cuff size:   large  Vitals Entered By: Boone Master CNA/MA (November 07, 2009 11:43 AM)  O2 Flow:  Room air CC: increased SOB, wheezing, dry cough, tightness/scratchiness in throat x3days   Is Patient Diabetic? No Comments Medications reviewed with patient Daytime contact number verified with patient. Boone Master CNA/MA  November 07, 2009 11:42 AM    Physical Exam  Additional Exam:  General: A/Ox3; pleasant and cooperative, NAD, very obese, resting room air sat 97% SKIN: no rash, lesions.  NODES: no lymphadenopathy HEENT: [place]/AT, EOM- WNL , TM-WNL, Nose- wet sniffing, Throat- clear and wnl, , Mallampati IV, multi dental caries  NECK: Supple w/ fair ROM, JVD- none, normal carotid impulses w/o bruits Thyroid-nonpalpable  CHEST: Coarse BS w/ upper airway psuedowheeze, no stridor noted  HEART: RRR, no m/g/r heard ABDOMEN: Soft and nl; ZOX:WRUE, nl pulses, no edema  NEURO: Grossly intact to observation      Impression & Recommendations:  Problem # 1:  EXTRINSIC ASTHMA, UNSPECIFIED (ICD-493.00) Exacerbation w/ recurrent upper airway cough ? ACE may be aggravating cough REC:  Prednisone taper over next week.  Mucinex DM two times a day as needed cough/congestion  Saline nasal rinses as needed  Would stop Lisinopril- this may be making your cough worse.  Begin Diovan 160mg  once daily -samples given.  follow up. Dr. Maple Hudson in 3 weeks.  Please contact office for sooner follow up if symptoms do not improve or worsen   Problem # 2:  HYPERTENSION, ESSENTIAL NOS (ICD-401.9)  recheck b/p on return  The following medications were removed from the medication list:    Lisinopril 20 Mg Tabs (Lisinopril) .[name]... Take one tablet daily for blood pressure. Her updated medication list for this problem includes:    Diovan 160 Mg Tabs (Valsartan) .[name]... 1 by mouth once daily    Hydrochlorothiazide 25 Mg  Tabs (Hydrochlorothiazide) ..... One tablet by mouth daily  Orders: Est. Patient Level IV (15176)  Medications Added to Medication List This Visit: 1)  Diovan 160 Mg Tabs (Valsartan) .[name].. 1 by mouth once daily  Complete Medication List: 1)  Diovan 160 Mg Tabs (Valsartan) .[name].. 1 by mouth once daily 2)  Omeprazole 40 Mg Cpdr (Omeprazole) .[name].. 1 by mouth two times a day 3)  Hydrochlorothiazide 25 Mg Tabs (Hydrochlorothiazide) .... One tablet by mouth daily 4)  Flonase 50 Mcg/act Susp (Fluticasone propionate) .... Take 2 sprays in each nostril daily for 3 days, then one spray in each nostil daily for 2 weeks 5)  Claritin 10 Mg Tabs (Loratadine) .... Take 1 tablet by mouth once a day 6)  Albuterol Sulfate (2.5 Mg/20ml) 0.083% Nebu (Albuterol sulfate) .[name].. 1 neb four times a day as needed 7)  Atrovent Hfa 17 Mcg/act Aers (Ipratropium bromide hfa) .... 2 puffs . qid as needed rescue 8)  Dulera 200-5 Mcg/act Aero (Mometasone furo-formoterol fum) .... Use two times a day for asthma. 9)  Epipen 0.3 Mg/0.56ml Devi (Epinephrine) .... Use as directed as needed severe allergic reaction 10)  Xolair 150 Mg Solr (Omalizumab) .... 300mg  every 2 weeks 11)  Ambien 10 Mg Tabs (Zolpidem tartrate) .... Take 1 by mouth at bedtime 12)  Proair Hfa 108 (90 Base) Mcg/act Aers (Albuterol sulfate) .... 2 puffs four times a day as needed  Patient Instructions: 1)  Prednisone taper over next week.  2)  Mucinex DM two times a day as needed cough/congestion  3)  Saline nasal rinses as needed  4)  Would stop Lisinopril- this may be making your cough worse.  5)  Begin Diovan 160mg  once daily -samples given.  6)  follow up. Dr. Maple Hudson in 3 weeks.  7)  Please contact office for sooner follow up if symptoms do not improve or worsen

## 2010-05-29 NOTE — Letter (Signed)
Summary: RETURN TO WORK LETTER  RETURN TO WORK LETTER   Imported By: Margie Billet 10/10/2009 15:11:11  _____________________________________________________________________  External Attachment:    Type:   Image     Comment:   External Document

## 2010-05-29 NOTE — Progress Notes (Signed)
Summary: refill/ hla  Phone Note Refill Request Message from:  Fax from Pharmacy on December 22, 2009 5:18 PM  Refills Requested: Medication #1:  PROAIR HFA 108 (90 BASE) MCG/ACT AERS 2 puffs four times a day as needed   Dosage confirmed as above?Dosage Confirmed   Last Refilled: 07/25/2009 Initial call taken by: Marin Roberts RN,  December 22, 2009 5:18 PM  Follow-up for Phone Call        OK to refill her [facility] inhaler as needed  Follow-up by: [name] MD,  December 24, 2009 10:38 PM    Prescriptions: PROAIR HFA 108 (90 BASE) MCG/ACT AERS (ALBUTEROL SULFATE) 2 puffs four times a day as needed  #1 x 11   Entered by:   Vernie Murders   Authorized by:   [name] MD   Signed by:   Vernie Murders on 12/25/2009   Method used:   Electronically to        [facility] [id number]* (retail)       [address]       [place], [place]  [address]       Ph: [phone]       Fax: [phone]   RxID:   [id number]

## 2010-05-31 NOTE — Assessment & Plan Note (Signed)
Summary: 3 month return/mhh   Copy to:  [facility] Primary Provider/Referring Provider:  Vassie Loll MD  CC:  3 month follow up visit-asthma.  History of Present Illness: January 31, 2010- Asthma / Xolair, rhinitis Post Hospital-asthma flare up per pt. She made several trips to ER about infected felon on her right index finger and was given doxycycline then bactrim with surgical drainage. She says for 2 weeks she has been having more trouble with asthma and nasal congestion, but doesn't think it is a cold.  Xolair has continued to help and she observes she hasn't needed to be hosp for her asthma. Today she feels watery nose and little wheeze- not bad. Taking prednisone 3 daily x 5 days. She has her meds. Had flu vax.   March 01, 2010- Asthma / Xolair, rhinitis Nurse-CC: Acute Visit , pt seen at [facility] ED on 02/28/10, c/o chest tightness prod cough yellow Seen yesterday at ER and is now on medrol taper. She says this started when she turned on car heater 2 days ago. Got Nyquil that night for ? a cold, but began wheezing and went to ER- neb, magnesium, IV steroid and a medrol taper. Now throbbing frontal headache, some wheeze. She didn't stay at ER because she was uncomfortable and didn't want to see if they could find her a room. Due for Xolair shot today. Sputum some yellow. Denies smoking but "around it sometimes".  May 04, 2010-  Asthma / Xolair, rhinitis Nurse-CC: 3 month follow up visit-asthma She is being evaluated for breast reduction surgery so we talked about common anesthesia concerns and the process of "clearance". Today she feels well.  Hosp in November with asthma exacerbation. She cancelled sleep study because she didn't want to take her braids out, but plans to reschedule.  Uses rescue inhalers daily, mainly at home more than at work. No recent need for her nebulizer.      Asthma History    Asthma Control Assessment:    Age range: 12+ years  Symptoms: 0-2 days/week    Nighttime Awakenings: 0-2/month    Interferes w/ normal activity: no limitations    SABA use (not for EIB): >2 days/week    FEV1 Pred: 3.38 liters (today)    Asthma Control Assessment: Not Well Controlled   Preventive Screening-Counseling & Management  Alcohol-Tobacco     Smoking Status: quit     Year Quit: 2008     Passive Smoke Exposure: no  Current Medications (verified): 1)  Diovan 160 Mg Tabs (Valsartan) .[name].. 1 By Mouth Once Daily 2)  Omeprazole 40 Mg Cpdr (Omeprazole) .[name].. 1 By Mouth Two Times A Day 3)  Hydrochlorothiazide 25 Mg Tabs (Hydrochlorothiazide) .... One Tablet By Mouth Daily 4)  Flonase 50 Mcg/act Susp (Fluticasone Propionate) .... Take 2 Sprays in Each Nostril Daily For 3 Days, Then One Spray in Each Nostil Daily For 2 Weeks 5)  Claritin 10 Mg Tabs (Loratadine) .... Take 1 Tablet By Mouth Once A Day 6)  Albuterol Sulfate (2.5 Mg/27ml) 0.083% Nebu (Albuterol Sulfate) .[name].. 1 Neb Four Times A Day As Needed 7)  Atrovent Hfa 17 Mcg/act Aers (Ipratropium Bromide Hfa) .... 2 Puffs . Qid As Needed Rescue 8)  Dulera 200-5 Mcg/act Aero (Mometasone Furo-Formoterol Fum) .... Use Two Times A Day For Asthma. 9)  Epipen 0.3 Mg/0.66ml Devi (Epinephrine) .... Use As Directed As Needed Severe Allergic Reaction 10)  Xolair 150 Mg Solr (Omalizumab) .... 300mg  Every 2 Weeks  11)  Proair Hfa 108 (90 Base) Mcg/act Aers (Albuterol Sulfate) .... 2 Puffs Four Times A Day As Needed 12)  Ambien 10 Mg Tabs (Zolpidem Tartrate) .... Take 1 Tablet By Mouth At Bedtime As Needed To Fall Asleep 13)  Claritin 10 Mg Tabs (Loratadine) .... Take 1 Tablet By Mouth Once A Day 14)  Duoneb 0.5-2.5 (3) Mg/20ml Soln (Ipratropium-Albuterol) 15)  Ipratropium Bromide 0.02 % Soln (Ipratropium Bromide) .... Mix With Albuterol in Neubulizer and Use Within 30 Mins.  Use 4 Times Daily As Needed For Sob. 16)  Ibuprofen 400 Mg Tabs (Ibuprofen) .... Take One Tab By Mouth Every 6 Hours As Needed For  Headache 17)  Percocet 5-325 Mg Tabs (Oxycodone-Acetaminophen) .... Take 1 Tablet By Mouth Every 6 Hrs As Needed For Pain 18)  Complete Allergy Relief 25 Mg Tabs (Diphenhydramine Hcl) .... Take 1 Tablet By Mouth Every 6 Hrs As Needed For Itching  Allergies (verified): 1)  ! * Ace Inhibitors/ Lisinopril  Past History:  Past Medical History: Last updated: 02/01/2009 ACANTHOSIS NIGRICANS (ICD-701.2) FOOT PAIN, RIGHT (ICD-729.5) MENORRHAGIA (ICD-626.2) ASTHMA, EXTRINSIC W/ACUTE EXACERBATION (ICD-493.02) HYPERTENSION, ESSENTIAL NOS (ICD-401.9) DENTAL PAIN (ICD-525.9) INSOMNIA (ICD-780.52) OBESITY, MORBID (ICD-278.01) ABDOMINAL PAIN (ICD-789.00) HELICOBACTER PYLORI INFECTION (ICD-041.86) SLEEP APNEA (ICD-780.57) COPD (ICD-496) ASTHMA (ICD-493.90)- Skin test 02/01/09 Pos Hemorrhoids. DEPRESSION (ICD-311) GERD (ICD-530.81) WRIST PAIN, RIGHT (ICD-719.43) TOBACCO USER (ICD-305.1) TUBAL LIGATION, HX OF (ICD-V26.51)  Past Surgical History: Last updated: 04/10/2006 Tubal ligation, bilateral, 1996  Family History: Last updated: 12/16/2008 daughter, grandmother, neice- asthma Aunt died- lung cancer  Social History: Last updated: 12/16/2008 Daughter in [employer] Not married Former smoker. 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  Risk Factors: Exercise: yes (03/14/2010)  Risk Factors: Smoking Status: quit (05/04/2010) Passive Smoke Exposure: no (05/04/2010)  Review of Systems      See HPI       The patient complains of shortness of breath with activity.  The patient denies shortness of breath at rest, productive cough, non-productive cough, coughing up blood, chest pain, irregular heartbeats, acid heartburn, indigestion, loss of appetite, weight change, abdominal pain, difficulty swallowing, sore throat, tooth/dental problems, headaches, nasal congestion/difficulty  breathing through nose, sneezing, itching, ear ache, rash, change in color of mucus, and fever.    Vital Signs:  Patient profile:   40 year old female Height:      66 inches Weight:      345 pounds BMI:     55.89 O2 Sat:      100 % on Room air Pulse rate:   85 / minute BP sitting:   124 / 86  (left arm) Cuff size:   large  Vitals Entered By: Reynaldo Minium CMA (May 04, 2010 1:59 PM)  O2 Flow:  Room air CC: 3 month follow up visit-asthma   Physical Exam  Additional Exam:  General: A/Ox3; pleasant and cooperative, NAD, very obese, resting room air sat 100%. SKIN: no rash, lesions.  NODES: no lymphadenopathy HEENT: [place]/AT, EOM- WNL , TM-WNL, Nose- wet sniffing mild erythema, white mucus, Throat- clear and wnl, , Mallampati IV, multi dental caries/ upper plate, no drainage NECK: Supple w/ fair ROM, JVD- none, normal carotid impulses w/o bruits Thyroid-nonpalpable  CHEST:  unlabored, no stridor noted . Poor airflow c/w obesity hypoventilation HEART: RRR, no m/g/r heard ABDOMEN: Soft and nl; ZOX:WRUE, nl pulses, no edema  NEURO: Grossly intact to  observation      Pre-Spirometry FEV1    Pred: 3.38 L     Impression & Recommendations:  Problem # 1:  ASTHMA (ICD-493.90) Today she is clear but she has potentially severe asthma and will need close watching at time of surgery if she decides to go ahead.   Obesity/ hypoventilation remains part of her limitation. Weight loss would help a lot and she may eventually consider herself a candidate for bariatric surgery.  Problem # 2:  ? of SLEEP APNEA (ICD-780.57)  Tentatively she intends to reschedule.   Problem # 3:  TOBACCO USER (ICD-305.1) She insists that she has completely quit, but sometimes can't avoid second hand smoke exposure.   Other Orders: Est. Patient Level III (32440)  Patient Instructions: 1)  Please schedule a follow-up appointment in 4 months. 2)  Continue present meds.

## 2010-05-31 NOTE — Consult Note (Signed)
Summary: [facility]  [facility]   Imported By: Lennie Odor 05/21/2010 11:03:20  _____________________________________________________________________  External Attachment:    Type:   Image     Comment:   External Document

## 2010-05-31 NOTE — Assessment & Plan Note (Signed)
Summary: return to work note/gg   Vital Signs:  Patient profile:   40 year old female Height:      66 inches (167.64 cm) Weight:      352.8 pounds (160.36 kg) BMI:     57.15 Temp:     97.8 degrees F oral Pulse rate:   78 / minute BP sitting:   155 / 92  (right arm) Cuff size:   regular  Vitals Entered By: Chinita Pester RN (May 08, 2010 2:51 PM) CC: Complete forms; letter stating she can go back to work w/o restrictions. Is Patient Diabetic? No Pain Assessment Patient in pain? yes     Location: back Intensity: 4 Type: aching Onset of pain  Intermittent Nutritional Status BMI of > 30 = obese  Have you ever been in a relationship where you felt threatened, hurt or afraid?No   Does patient need assistance? Functional Status Self care Ambulation Normal   Primary Care Provider:  Vassie Loll MD  CC:  Complete forms; letter stating she can go back to work w/o restrictions..  History of Present Illness: Pt is a 40 y/o F who presents today for a return to work note.  Pt drives a fork-lift for a living.  She has been out of work for 1 weeks 2/2 back pain.  Her pain began after moving furniture.  Pt reports increased itching with percocet not alleviated with benadry; she no longer wishes to take this medicationl.  Her pain has significantly improved but still present intermittently.  She states she is able to perform all of her usual activities without difficulty and feels able to return to work.  She denies fever, chills, weakness, numbness, tingling, difficulty walking, bowel/bladder incontinence or retention.   Pt also requests completion of disability paperwork today..  Pt is planning to have breast reduction surgery next month and states she needs a pre-op mammogram prior to surgery.  .    Depression History:      The patient denies a depressed mood most of the day.         Preventive Screening-Counseling & Management  Alcohol-Tobacco     Alcohol drinks/day: 0  Smoking Status: quit     Year Quit: 2008     Passive Smoke Exposure: no  Caffeine-Diet-Exercise     Does Patient Exercise: yes     Type of exercise: WALKING  Current Medications (verified): 1)  Diovan 160 Mg Tabs (Valsartan) .[name].. 1 By Mouth Once Daily 2)  Omeprazole 40 Mg Cpdr (Omeprazole) .[name].. 1 By Mouth Two Times A Day 3)  Hydrochlorothiazide 25 Mg Tabs (Hydrochlorothiazide) .... One Tablet By Mouth Daily 4)  Flonase 50 Mcg/act Susp (Fluticasone Propionate) .... Take 2 Sprays in Each Nostril Daily For 3 Days, Then One Spray in Each Nostil Daily For 2 Weeks 5)  Claritin 10 Mg Tabs (Loratadine) .... Take 1 Tablet By Mouth Once A Day 6)  Albuterol Sulfate (2.5 Mg/76ml) 0.083% Nebu (Albuterol Sulfate) .[name].. 1 Neb Four Times A Day As Needed 7)  Atrovent Hfa 17 Mcg/act Aers (Ipratropium Bromide Hfa) .... 2 Puffs . Qid As Needed Rescue 8)  Dulera 200-5 Mcg/act Aero (Mometasone Furo-Formoterol Fum) .... Use Two Times A Day For Asthma. 9)  Epipen 0.3 Mg/0.59ml Devi (Epinephrine) .... Use As Directed As Needed Severe Allergic Reaction 10)  Xolair 150 Mg Solr (Omalizumab) .... 300mg  Every 2 Weeks 11)  Proair Hfa 108 (90 Base) Mcg/act Aers (Albuterol Sulfate) .... 2 Puffs Four Times A Day As Needed  12)  Ambien 10 Mg Tabs (Zolpidem Tartrate) .... Take 1 Tablet By Mouth At Bedtime As Needed To Fall Asleep 13)  Claritin 10 Mg Tabs (Loratadine) .... Take 1 Tablet By Mouth Once A Day 14)  Duoneb 0.5-2.5 (3) Mg/7ml Soln (Ipratropium-Albuterol) 15)  Ipratropium Bromide 0.02 % Soln (Ipratropium Bromide) .... Mix With Albuterol in Neubulizer and Use Within 30 Mins.  Use 4 Times Daily As Needed For Sob. 16)  Ibuprofen 400 Mg Tabs (Ibuprofen) .... Take One Tab By Mouth Every 6 Hours As Needed For Headache 17)  Complete Allergy Relief 25 Mg Tabs (Diphenhydramine Hcl) .... Take 1 Tablet By Mouth Every 6 Hrs As Needed For Itching 18)  Meloxicam 7.5 Mg Tabs (Meloxicam) .... Take 1 Tablet By Mouth Two Times A Day As  Needed For Pain.  Take With Food.  Allergies (verified): 1)  ! * Ace Inhibitors/ Lisinopril  Past History:  Past medical, surgical, family and social histories (including risk factors) reviewed for relevance to current acute and chronic problems.  Past Medical History: Reviewed history from 02/01/2009 and no changes required. 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 DEPRESSION (ICD-311) GERD (ICD-530.81) WRIST PAIN, RIGHT (ICD-719.43) TOBACCO USER (ICD-305.1) TUBAL LIGATION, HX OF (ICD-V26.51)  Past Surgical History: Reviewed history from 04/10/2006 and no changes required. Tubal ligation, bilateral, 1996  Family History: Reviewed history from 12/16/2008 and no changes required. daughter, grandmother, neice- asthma Aunt died- lung cancer  Social History: Reviewed history from 12/16/2008 and no changes required. Daughter in [employer] Not married Former smoker. 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  Physical Exam  General:  vitals reviewed, Obese, alert.  NAD Head:  normocephalic and atraumatic.   Eyes:  vision grossly intact, pupils equal, pupils round, and pupils reactive to light.  EOMI.  sclerae anicteric Neck:  supple and no masses.   Lungs:  normal respiratory effort, normal breath sounds, no crackles, and no wheezes.   Heart:  normal rate, regular rhythm, no murmur, and no gallop.   Abdomen:  soft and non-tender.   Msk:  no joint swelling, no joint warmth, no redness over  joints, and no joint deformities.  normal ROM.  no TTP over spinous or transverse processes throughout spine.  Extremities:  No edema, cyanosis, or clubbing.  Neurologic:  alert & oriented X3.  cranial nerves II-XII intact, strength normal in all extremities, sensation intact to light touch, sensation intact to pinprick, gait normal, and DTRs symmetrical and normal.   Skin:  turgor normal, color normal, and no rashes.   Psych:  Oriented X3, memory intact for recent and remote, normally interactive, good eye contact, not anxious appearing, and not depressed appearing.     Impression & Recommendations:  Problem # 1:  BACK PAIN, CHRONIC, INTERMITTENT (ICD-724.5)  The following medications were removed from the medication list:    Ibuprofen 400 Mg Tabs (Ibuprofen) .[name]... Take one tab by mouth every 6 hours as needed for headache    Percocet 5-325 Mg Tabs (Oxycodone-acetaminophen) .[name]... Take 1 tablet by mouth every 6 hrs as needed for pain Her updated medication list for this problem includes:    Meloxicam 7.5 Mg  Tabs (Meloxicam) .[name]... Take 1 tablet by mouth two times a day as needed for pain.  take with food.  Discussed use of moist heat or ice, modified activities, medications, and stretching/strengthening exercises. Back care instructions given.  Pt advised to be rtc  if no improvement; sooner if worsening of symptoms.   Will remove percocet from med list and provide rx for meloxicam.  Pt advised to avoid any additional NSAIDs while she takes mobic.    Problem # 2:  PREOPERATIVE EXAMINATION (ICD-V72.84)  Orders: Mammogram (Mammogram)  Will order pre-operative mammorgram for pts upcoming breast reduction surgery.  Complete Medication List: 1)  Diovan 160 Mg Tabs (Valsartan) .[name].. 1 by mouth once daily 2)  Omeprazole 40 Mg Cpdr (Omeprazole) .[name].. 1 by mouth two times a day 3)  Hydrochlorothiazide 25 Mg Tabs (Hydrochlorothiazide) .... One tablet by mouth daily 4)  Flonase 50 Mcg/act Susp  (Fluticasone propionate) .... Take 2 sprays in each nostril daily for 3 days, then one spray in each nostil daily for 2 weeks 5)  Claritin 10 Mg Tabs (Loratadine) .... Take 1 tablet by mouth once a day 6)  Albuterol Sulfate (2.5 Mg/23ml) 0.083% Nebu (Albuterol sulfate) .[name].. 1 neb four times a day as needed 7)  Atrovent Hfa 17 Mcg/act Aers (Ipratropium bromide hfa) .... 2 puffs . qid as needed rescue 8)  Dulera 200-5 Mcg/act Aero (Mometasone furo-formoterol fum) .... Use two times a day for asthma. 9)  Epipen 0.3 Mg/0.23ml Devi (Epinephrine) .... Use as directed as needed severe allergic reaction 10)  Xolair 150 Mg Solr (Omalizumab) .... 300mg  every 2 weeks 11)  Proair Hfa 108 (90 Base) Mcg/act Aers (Albuterol sulfate) .... 2 puffs four times a day as needed 12)  Ambien 10 Mg Tabs (Zolpidem tartrate) .... Take 1 tablet by mouth at bedtime as needed to fall asleep 13)  Claritin 10 Mg Tabs (Loratadine) .... Take 1 tablet by mouth once a day 14)  Duoneb 0.5-2.5 (3) Mg/65ml Soln (Ipratropium-albuterol) 15)  Ipratropium Bromide 0.02 % Soln (Ipratropium bromide) .... Mix with albuterol in neubulizer and use within 30 mins.  use 4 times daily as needed for sob. 16)  Complete Allergy Relief 25 Mg Tabs (Diphenhydramine hcl) .... Take 1 tablet by mouth every 6 hrs as needed for itching 17)  Meloxicam 7.5 Mg Tabs (Meloxicam) .... Take 1 tablet by mouth two times a day as needed for pain.  take with food.  Patient Instructions: 1)  Please schedule a follow-up appointment in 1-2 months with your primary care provider, Dr. Anselm Jungling. 2)  Meloxicam (mobic) is a medicine to help with your back pain.  Be sure to take this medicine with food. 3)  Do not take any ibuprofen, advil, aleve, motrin, or other NSAID medications while you are taking meloxicam. 4)  We have referred you for a mammogram. 5)  I will give your disability paperwork to Dr. Anselm Jungling for completion. Prescriptions: MELOXICAM 7.5 MG TABS (MELOXICAM) Take 1 tablet  by mouth two times a day as needed for pain.  Take with food.  #40 x 0   Entered and Authorized by:   Nelda Bucks DO   Signed by:   Nelda Bucks DO on 05/08/2010   Method used:   Electronically to        [facility] [phone] (retail)       [address]       [place], [place]  [address]       Ph: [phone]  Fax: [phone]   RxID:   [id number]    Orders Added: 1)  Est. Patient Level III [62952] 2)  Mammogram [Mammogram]     Prevention & Chronic Care Immunizations   Influenza vaccine: Fluvax MCR  (12/27/2009)    Tetanus booster: Not documented    Pneumococcal vaccine: Pneumovax  (03/17/2009)  Other Screening   Pap smear: Not documented   Pap smear action/deferral: Deferred  (04/17/2009)   Smoking status: quit  (05/08/2010)  Lipids   Total Cholesterol: 183  (10/06/2009)   Lipid panel action/deferral: Lipid Panel ordered   LDL: 123  (10/06/2009)   LDL Direct: Not documented   HDL: 47  (10/06/2009)   Triglycerides: 67  (10/06/2009)  Hypertension   Last Blood Pressure: 155 / 92  (05/08/2010)   Serum creatinine: 0.65  (03/08/2010)   Serum potassium 4.0  (03/08/2010)  Self-Management Support :   Personal Goals (by the next clinic visit) :      Personal blood pressure goal: 140/90  (01/18/2009)   Patient will work on the following items until the next clinic visit to reach self-care goals:     Medications and monitoring: take my medicines every day, bring all of my medications to every visit  (05/08/2010)     Eating: use fresh or frozen vegetables, eat foods that are low in salt, eat baked foods instead of fried foods  (05/08/2010)     Activity: take a 30 minute walk every day  (05/08/2010)    Hypertension self-management support: Written self-care plan  (05/08/2010)   Hypertension self-care plan printed.

## 2010-05-31 NOTE — Progress Notes (Addendum)
Summary: phone/gg **  Phone Note Call from Patient   Caller: Patient Summary of Call: Pt called and wants a note to return to work without restrictions.  You gave her a note to be out of work with restrictions until 1/9.  Her job requires another letter saying she can work without any restrictions.   Pt feels much better and is ready to get back to work.  back pain is better, she will not take pain med while at work. She needs today Pt # [phone] Initial call taken by: Merrie Roof RN,  May 08, 2010 9:55 AM  Follow-up for Phone Call        Please have her come in this afternoon for a brief reassessment. Follow-up by: Margarito Liner MD,  May 08, 2010 11:11 AM  Additional Follow-up for Phone Call Additional follow up Details #1::        will come in at 1330 for work in with Dr Arvilla Market Additional Follow-up by: Merrie Roof RN,  May 08, 2010 11:18 AM

## 2010-05-31 NOTE — Assessment & Plan Note (Signed)
Summary: xolair/mhh  Nurse Visit   Allergies: 1)  ! * Ace Inhibitors/ Lisinopril  Medication Administration  Injection # 1:    Medication: Xolair (omalizumab) 150mg     Diagnosis: EXTRINSIC ASTHMA, UNSPECIFIED (ICD-493.00)    Route: SQ    Site: L deltoid    Exp Date: 04/2013    Lot #: [id number]    Mfr: Genetech    Comments: 0.9 ML IN LEFT AND RIGHT ARM 225MG  CHARGED [phone]    Patient tolerated injection without complications    Given by: SUSANNE FORD IN [facility]  Orders Added: 1)  Administration xolair injection [id number]   Medication Administration  Injection # 1:    Medication: Xolair (omalizumab) 150mg     Diagnosis: EXTRINSIC ASTHMA, UNSPECIFIED (ICD-493.00)    Route: SQ    Site: L deltoid    Exp Date: 04/2013    Lot #: [id number]    Mfr: Genetech    Comments: 0.9 ML IN LEFT AND RIGHT ARM 225MG  CHARGED [id number]    Patient tolerated injection without complications    Given by: SUSANNE FORD IN [facility]  Orders Added: 1)  Administration xolair injection [phone]

## 2010-05-31 NOTE — Letter (Signed)
Summary: Generic Letter  [facility]  [address]   [place], [place] [address]   Phone: [phone]  Fax: [phone]    05/08/2010  [name] [address] [place], [place]  [address]  To Whom It May Concern,   Ms. Eastwood was out of work from 05/01/2010 - 05/07/2010 for medical reasons.  She is now medically cleared to resume work without any restrictions on 05/09/2010.           Sincerely,   Nelda Bucks DO

## 2010-05-31 NOTE — Letter (Signed)
Summary: FMLA FORM  FMLA FORM   Imported By: Margie Billet 05/25/2010 16:32:05  _____________________________________________________________________  External Attachment:    Type:   Image     Comment:   External Document

## 2010-05-31 NOTE — Letter (Signed)
Summary: RETURN TO WORK  RETURN TO WORK   Imported By: Margie Billet 05/04/2010 14:52:41  _____________________________________________________________________  External Attachment:    Type:   Image     Comment:   External Document

## 2010-05-31 NOTE — Assessment & Plan Note (Signed)
Summary: xolair//sh  Nurse Visit   Allergies: 1)  ! * Ace Inhibitors/ Lisinopril  Medication Administration  Injection # 1:    Medication: Xolair (omalizumab) 150mg     Diagnosis: EXTRINSIC ASTHMA, UNSPECIFIED (ICD-493.00)    Route: SQ    Site: L deltoid    Exp Date: 04/2013    Lot #: [id number]    Mfr: Salome Spotted    Comments: 0.9 ML IN LEFT AND RIGHT ARM 225 MG CHARGED [id number]    Given by: Dimas Millin IN [facility]  Orders Added: 1)  Administration xolair injection [id number]   Medication Administration  Injection # 1:    Medication: Xolair (omalizumab) 150mg     Diagnosis: EXTRINSIC ASTHMA, UNSPECIFIED (ICD-493.00)    Route: SQ    Site: L deltoid    Exp Date: 04/2013    Lot #: [id number]    Mfr: Salome Spotted    Comments: 0.9 ML IN LEFT AND RIGHT ARM 225 MG CHARGED [address]01    Given by: Dimas Millin IN [facility]  Orders Added: 1)  Administration xolair injection [[address]]

## 2010-05-31 NOTE — Progress Notes (Signed)
Summary: dulera  Phone Note Call from Patient Call back at Home Phone [phone]   Caller: Nursing Home Call For: Dr Maple Hudson Summary of Call: Patient phoned and would  to know if she could get enough samples of Dulera, and Pro Air or Atrovent to last until the first of the year. Patient is at work and can be reached at [phone] Initial call taken by: Vedia Coffer,  April 18, 2010 1:52 PM  Follow-up for Phone Call        pt advised no samples of pro-air of atrovent but I did levae pt a sample of dulera. This is good for one month.Carron Curie CMA  April 18, 2010 3:53 PM

## 2010-05-31 NOTE — Letter (Signed)
Summary: Generic Letter  [facility]  [address]   [place], [place] [address]   Phone: [phone]  Fax: [phone]    05/08/2010  Lakevia Korman [address] [place], [place]  [address]  To Whom It May Concern,   Ms. Rosch is medically cleared to return to work without any restrictions.  Please feel free to contact our office with any questions at [phone].    Sincerely,   Nelda Bucks DO

## 2010-05-31 NOTE — Assessment & Plan Note (Signed)
Summary: back pain x 3 wks/pcp-ho/hla   Vital Signs:  Patient profile:   40 year old female Height:      66 inches (167.64 cm) Weight:      343.01 pounds (155.91 kg) BMI:     55.56 Temp:     97.3 degrees F (36.28 degrees C) oral Pulse rate:   81 / minute BP sitting:   119 / 71  (right arm) Cuff size:   large  Vitals Entered By: Angelina Ok RN (May 01, 2010 10:30 AM) CC: Depression Is Patient Diabetic? No Pain Assessment Patient in pain? yes     Location: back Intensity: 10 Nutritional Status BMI of > 30 = obese  Have you ever been in a relationship where you felt threatened, hurt or afraid?No   Does patient need assistance? Functional Status Self care Ambulation Normal Comments Back pain.  Wants a referral to a specialist.  needs refills.  Atrovent, Dulera, B/P med.  Wants something for her back pain.  ? Breast reduction?   Primary Care Provider:  Vassie Loll MD  CC:  Depression.  History of Present Illness: 40 yr old woman with pmhx as described below comes to the clinic for back pain. Patient reports that about a month ago she was lifting some boxes at her work and strained her back. Patient says that she was getting better but for the last two days her back pain has flared up. Located on lumbar area. Denies saddle anesthesia, bowel or bladder incontinence, new weakness or numbness.   Patient would like to get a referral for breast reduction.  Continues to have problems sleeping. She is scheduled to have a sleeping study but has rescheduled it.   Depression History:      The patient denies a depressed mood most of the day and a diminished interest in her usual daily activities.         Preventive Screening-Counseling & Management  Alcohol-Tobacco     Smoking Status: quit     Year Quit: 2008     Passive Smoke Exposure: no  Problems Prior to Update: 1)  Cold Sore  (ICD-054.9) 2)  Anemia, Mild  (ICD-285.9) 3)  Arm Pain, Right  (ICD-729.5) 4)  Extrinsic  Asthma, Unspecified  (ICD-493.00) 5)  Routine Gynecological Examination  (ICD-V72.31) 6)  Arm Pain, Left  (ICD-729.5) 7)  Chest Pain  (ICD-786.50) 8)  Gastroesophageal Reflux Disease, Chronic  (ICD-530.81) 9)  Knee Pain, Left  (ICD-719.46) 10)  Baker's Cyst, Left Knee  (ICD-727.51) 11)  Back Pain, Chronic, Intermittent  (ICD-724.5) 12)  Dental Pain  (ICD-525.9) 13)  Leg Edema, Left  (ICD-782.3) 14)  Hemorrhoids  (ICD-455.6) 15)  Diarrhea  (ICD-787.91) 16)  Headache  (ICD-784.0) 17)  Acanthosis Nigricans  (ICD-701.2) 18)  Foot Pain, Right  (ICD-729.5) 19)  Menorrhagia  (ICD-626.2) 20)  Asthma, Extrinsic W/acute Exacerbation  (ICD-493.02) 21)  Rhinitis  (ICD-472.0) 22)  Hypertension, Essential Nos  (ICD-401.9) 23)  Dental Pain  (ICD-525.9) 24)  Insomnia  (ICD-780.52) 25)  Obesity, Morbid  (ICD-278.01) 26)  Abdominal Pain  (ICD-789.00) 27)  Helicobacter Pylori Infection  (ICD-041.86) 28)  Sleep Apnea  (ICD-780.57) 29)  COPD  (ICD-496) 30)  Asthma  (ICD-493.90) 31)  Depression  (ICD-311) 32)  Gerd  (ICD-530.81) 33)  Wrist Pain, Right  (ICD-719.43) 34)  Tobacco User  (ICD-305.1) 35)  Tubal Ligation, Hx of  (ICD-V26.51)  Medications Prior to Update: 1)  Diovan 160 Mg Tabs (Valsartan) .[name].. 1 By Mouth Once  Daily 2)  Omeprazole 40 Mg Cpdr (Omeprazole) .[name].. 1 By Mouth Two Times A Day 3)  Hydrochlorothiazide 25 Mg Tabs (Hydrochlorothiazide) .... One Tablet By Mouth Daily 4)  Flonase 50 Mcg/act Susp (Fluticasone Propionate) .... Take 2 Sprays in Each Nostril Daily For 3 Days, Then One Spray in Each Nostil Daily For 2 Weeks 5)  Claritin 10 Mg Tabs (Loratadine) .... Take 1 Tablet By Mouth Once A Day 6)  Albuterol Sulfate (2.5 Mg/46ml) 0.083% Nebu (Albuterol Sulfate) .[name].. 1 Neb Four Times A Day As Needed 7)  Atrovent Hfa 17 Mcg/act Aers (Ipratropium Bromide Hfa) .... 2 Puffs . Qid As Needed Rescue 8)  Dulera 200-5 Mcg/act Aero (Mometasone Furo-Formoterol Fum) .... Use Two Times A Day For  Asthma. 9)  Epipen 0.3 Mg/0.51ml Devi (Epinephrine) .... Use As Directed As Needed Severe Allergic Reaction 10)  Xolair 150 Mg Solr (Omalizumab) .... 300mg  Every 2 Weeks 11)  Ambien 10 Mg Tabs (Zolpidem Tartrate) .... Take 1 By Mouth At Bedtime As Needed To Fall Sleep. 12)  Proair Hfa 108 (90 Base) Mcg/act Aers (Albuterol Sulfate) .... 2 Puffs Four Times A Day As Needed 13)  Prednisone 10 Mg Tabs (Prednisone) .... Take 4 Tabs Daily For 5 Days By Mouth, 3 Tabs Daily For 5 Days, 2 Tabs Daily For 5 Days, 1 Tab Daily For 5 Days and Stop. 14)  Avelox 400 Mg Tabs (Moxifloxacin Hcl) .[name].. 1 Daily 15)  Ambien 10 Mg Tabs (Zolpidem Tartrate) .... Take 1 Tablet By Mouth At Bedtime As Needed To Fall Asleep 16)  Claritin 10 Mg Tabs (Loratadine) .... Take 1 Tablet By Mouth Once A Day 17)  Duoneb 0.5-2.5 (3) Mg/25ml Soln (Ipratropium-Albuterol) 18)  Ipratropium Bromide 0.02 % Soln (Ipratropium Bromide) .... Mix With Albuterol in Neubulizer and Use Within 30 Mins.  Use 4 Times Daily As Needed For Sob. 19)  Ibuprofen 400 Mg Tabs (Ibuprofen) .... Take One Tab By Mouth Every 6 Hours As Needed For Headache  Current Medications (verified): 1)  Diovan 160 Mg Tabs (Valsartan) .[name].. 1 By Mouth Once Daily 2)  Omeprazole 40 Mg Cpdr (Omeprazole) .[name].. 1 By Mouth Two Times A Day 3)  Hydrochlorothiazide 25 Mg Tabs (Hydrochlorothiazide) .... One Tablet By Mouth Daily 4)  Flonase 50 Mcg/act Susp (Fluticasone Propionate) .... Take 2 Sprays in Each Nostril Daily For 3 Days, Then One Spray in Each Nostil Daily For 2 Weeks 5)  Claritin 10 Mg Tabs (Loratadine) .... Take 1 Tablet By Mouth Once A Day 6)  Albuterol Sulfate (2.5 Mg/52ml) 0.083% Nebu (Albuterol Sulfate) .[name].. 1 Neb Four Times A Day As Needed 7)  Atrovent Hfa 17 Mcg/act Aers (Ipratropium Bromide Hfa) .... 2 Puffs . Qid As Needed Rescue 8)  Dulera 200-5 Mcg/act Aero (Mometasone Furo-Formoterol Fum) .... Use Two Times A Day For Asthma. 9)  Epipen 0.3 Mg/0.24ml Devi (Epinephrine)  .... Use As Directed As Needed Severe Allergic Reaction 10)  Xolair 150 Mg Solr (Omalizumab) .... 300mg  Every 2 Weeks 11)  Ambien 10 Mg Tabs (Zolpidem Tartrate) .... Take 1 By Mouth At Bedtime As Needed To Fall Sleep. 12)  Proair Hfa 108 (90 Base) Mcg/act Aers (Albuterol Sulfate) .... 2 Puffs Four Times A Day As Needed 13)  Ambien 10 Mg Tabs (Zolpidem Tartrate) .... Take 1 Tablet By Mouth At Bedtime As Needed To Fall Asleep 14)  Claritin 10 Mg Tabs (Loratadine) .... Take 1 Tablet By Mouth Once A Day 15)  Duoneb 0.5-2.5 (3) Mg/63ml Soln (Ipratropium-Albuterol) 16)  Ipratropium  Bromide 0.02 % Soln (Ipratropium Bromide) .... Mix With Albuterol in Neubulizer and Use Within 30 Mins.  Use 4 Times Daily As Needed For Sob. 17)  Ibuprofen 400 Mg Tabs (Ibuprofen) .... Take One Tab By Mouth Every 6 Hours As Needed For Headache  Allergies: 1)  ! * Ace Inhibitors/ Lisinopril  Past History:  Past Medical History: Last updated: 02/01/2009 ACANTHOSIS NIGRICANS (ICD-701.2) FOOT PAIN, RIGHT (ICD-729.5) MENORRHAGIA (ICD-626.2) ASTHMA, EXTRINSIC W/ACUTE EXACERBATION (ICD-493.02) HYPERTENSION, ESSENTIAL NOS (ICD-401.9) DENTAL PAIN (ICD-525.9) INSOMNIA (ICD-780.52) OBESITY, MORBID (ICD-278.01) ABDOMINAL PAIN (ICD-789.00) HELICOBACTER PYLORI INFECTION (ICD-041.86) SLEEP APNEA (ICD-780.57) COPD (ICD-496) ASTHMA (ICD-493.90)- Skin test 02/01/09 Pos Hemorrhoids. DEPRESSION (ICD-311) GERD (ICD-530.81) WRIST PAIN, RIGHT (ICD-719.43) TOBACCO USER (ICD-305.1) TUBAL LIGATION, HX OF (ICD-V26.51)  Past Surgical History: Last updated: 04/10/2006 Tubal ligation, bilateral, 1996  Family History: Last updated: 12/16/2008 daughter, grandmother, neice- asthma Aunt died- lung cancer  Social History: Last updated: 12/16/2008 Daughter in [employer] Not married Former smoker. 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  Risk Factors: Exercise: yes (03/14/2010)  Risk Factors: Smoking Status: quit (05/01/2010) Passive Smoke Exposure: no (05/01/2010)  Family History: Reviewed history from 12/16/2008 and no changes required. daughter, grandmother, neice- asthma Aunt died- lung cancer  Social History: Reviewed history from 12/16/2008 and no changes required. Daughter in [employer] Not married Former smoker. 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  Review of Systems  The patient denies fever, chest pain, syncope, dyspnea on exertion, peripheral edema, prolonged cough, headaches, hemoptysis, abdominal pain, melena, hematochezia, severe indigestion/heartburn, and hematuria.    Physical Exam  General:  mild distress 2/2 pain, vitals reviewed, Obese Mouth:  MMM Neck:  supple and no masses.   Lungs:  normal respiratory effort, normal breath sounds, no crackles, and no wheezes.   Heart:  normal rate, regular rhythm, no murmur, and no gallop.   Abdomen:  soft and non-tender.   Msk:  limited range of motion due to pain, ttp lumbar paraspinal muscles no joint swelling, no joint warmth, no redness over joints, and no joint deformities.   Extremities:  No edema, cyanosis, or clubbing.  Neurologic:  alert & oriented X3.  cranial nerves II-XII intact, strength normal in all extremities, sensation intact to light touch, sensation intact to pinprick, gait normal, and DTRs symmetrical and normal.     Impression & Recommendations:  Problem # 1:  BACK PAIN, CHRONIC, INTERMITTENT (ICD-724.5) Acute flare up of chronic back pain. Will manage conservatively. Instructed to take percocet in the acute setting as needed for pain and not to limit her activity. Will consider referral to pain clinic in the future for possible steroid injection. No symptoms concerning for cauda equina syndrome. Educated on  the need to lose weight at it is one of the major contributors for chronic back pain. Patient thinks that a breast reduction would also help her so we also helped her get an appointment with local plastic surgeon. Will follow up.  Her updated medication list for this problem includes:    Ibuprofen 400 Mg Tabs (Ibuprofen) .[name]... Take one tab by mouth every 6 hours as needed for headache    Percocet 5-325 Mg Tabs (Oxycodone-acetaminophen) .[name]... Take 1 tablet by mouth every 6 hrs as needed for pain  Problem # 2:  SLEEP APNEA (ICD-780.57) Will follow up sleep study.  Complete Medication List: 1)  Diovan 160 Mg Tabs (Valsartan) .[name].. 1 by mouth once daily 2)  Omeprazole 40 Mg Cpdr (Omeprazole) .[name].. 1 by mouth two times a day 3)  Hydrochlorothiazide 25 Mg Tabs (Hydrochlorothiazide) .... One tablet by mouth daily 4)  Flonase 50 Mcg/act Susp (Fluticasone propionate) .... Take 2 sprays in each nostril daily for 3 days, then one spray in each nostil daily for 2 weeks 5)  Claritin 10 Mg Tabs (Loratadine) .... Take 1 tablet by mouth once a day 6)  Albuterol Sulfate (2.5 Mg/23ml) 0.083% Nebu (Albuterol sulfate) .[name].. 1 neb four times a day as needed 7)  Atrovent Hfa 17 Mcg/act Aers (Ipratropium bromide hfa) .... 2 puffs . qid as needed rescue 8)  Dulera 200-5 Mcg/act Aero (Mometasone furo-formoterol fum) .... Use two times a day for asthma. 9)  Epipen 0.3 Mg/0.64ml Devi (Epinephrine) .... Use as directed as needed severe allergic reaction 10)  Xolair 150 Mg Solr (Omalizumab) .... 300mg  every 2 weeks 11)  Ambien 10 Mg Tabs (Zolpidem tartrate) .... Take 1 by mouth at bedtime as needed to fall sleep. 12)  Proair Hfa 108 (90 Base) Mcg/act Aers (Albuterol sulfate) .... 2 puffs four times a day as needed 13)  Ambien 10 Mg Tabs (Zolpidem tartrate) .... Take 1 tablet by mouth at bedtime as needed to fall asleep 14)  Claritin 10 Mg Tabs (Loratadine) .... Take 1 tablet by mouth once a day 15)  Duoneb 0.5-2.5 (3) Mg/26ml  Soln (Ipratropium-albuterol) 16)  Ipratropium Bromide 0.02 % Soln (Ipratropium bromide) .... Mix with albuterol in neubulizer and use within 30 mins.  use 4 times daily as needed for sob. 17)  Ibuprofen 400 Mg Tabs (Ibuprofen) .... Take one tab by mouth every 6 hours as needed for headache 18)  Percocet 5-325 Mg Tabs (Oxycodone-acetaminophen) .... Take 1 tablet by mouth every 6 hrs as needed for pain 19)  Complete Allergy Relief 25 Mg Tabs (Diphenhydramine hcl) .... Take 1 tablet by mouth every 6 hrs as needed for itching  Patient Instructions: 1)  Please schedule a follow-up appointment in 2 weeks. 2)  Take percocet for pain as needed. 3)  Take all other medications as directed. 4)  It is important that you exercise regularly at least 20 minutes 5 times a week. If you develop chest pain, have severe difficulty breathing, or feel very tired , stop exercising immediately and seek medical attention. 5)  You need to lose weight. Consider a lower calorie diet and regular exercise.  Prescriptions: AMBIEN 10 MG TABS (ZOLPIDEM TARTRATE) take 1 by mouth at bedtime as needed to fall sleep.  #31 x 1   Entered and Authorized by:   [name] MD   Signed by:   [name] MD on 05/01/2010   Method used:   Print then Give to Patient   RxID:   [id number] COMPLETE ALLERGY RELIEF 25 MG TABS (DIPHENHYDRAMINE HCL) Take 1 tablet by mouth every 6 hrs as needed for itching  #60 x 1   Entered and Authorized by:   [name] MD   Signed by:   [name] MD on 05/01/2010   Method used:   Print then Give to Patient   RxID:   [id number] PERCOCET 5-325 MG TABS (OXYCODONE-ACETAMINOPHEN) Take 1 tablet by mouth every 6 hrs as needed for pain  #56 x 0   Entered and Authorized by:   [name] MD   Signed  by:   [name] MD on 05/01/2010   Method used:   Print then Give to Patient   RxID:   [id number]    Orders Added: 1)  Est.  Patient Level III [13086]    Prevention & Chronic Care Immunizations   Influenza vaccine: Fluvax MCR  (12/27/2009)    Tetanus booster: Not documented    Pneumococcal vaccine: Pneumovax  (03/17/2009)  Other Screening   Pap smear: Not documented   Pap smear action/deferral: Deferred  (04/17/2009)   Smoking status: quit  (05/01/2010)  Lipids   Total Cholesterol: 183  (10/06/2009)   Lipid panel action/deferral: Lipid Panel ordered   LDL: 123  (10/06/2009)   LDL Direct: Not documented   HDL: 47  (10/06/2009)   Triglycerides: 67  (10/06/2009)  Hypertension   Last Blood Pressure: 119 / 71  (05/01/2010)   Serum creatinine: 0.65  (03/08/2010)   Serum potassium 4.0  (03/08/2010)  Self-Management Support :   Personal Goals (by the next clinic visit) :      Personal blood pressure goal: 140/90  (01/18/2009)   Patient will work on the following items until the next clinic visit to reach self-care goals:     Medications and monitoring: take my medicines every day, bring all of my medications to every visit  (05/01/2010)     Eating: drink diet soda or water instead of juice or soda, eat more vegetables, use fresh or frozen vegetables, eat foods that are low in salt, eat baked foods instead of fried foods, eat fruit for snacks and desserts, limit or avoid alcohol  (05/01/2010)     Activity: take a 30 minute walk every day  (05/01/2010)    Hypertension self-management support: Education handout, Psychologist, forensic, Resources for patients handout, Written self-care plan  (05/01/2010)   Hypertension self-care plan printed.   Hypertension education handout printed      Resource handout printed.

## 2010-05-31 NOTE — Progress Notes (Signed)
Summary: phone/gg  Phone Note Call from Patient   Caller: Patient Summary of Call: Pt called and wants referral for mammogram. Initial call taken by: Merrie Roof RN,  May 07, 2010 5:04 PM  New Problems: OTHER SCREENING BREAST EXAMINATION (ICD-V76.19)   New Problems: OTHER SCREENING BREAST EXAMINATION (ICD-V76.19)  Ok for mammogram. Referral sent 1/10

## 2010-05-31 NOTE — Progress Notes (Signed)
Summary: letter for anesthia  Phone Note Call from Patient   Caller: Patient Call For: Dr. Maple Hudson Summary of Call: Patient phoned she stated that she spoke to [facility] last week and her letter for surgery clearence was recieved but they need one stating that she can under go anethasia for 4 hours. Patient has an appointment for Xolair today and wants to pick the letter up at that time. Patient can be reached [phone] Initial call taken by: Vedia Coffer,  May 09, 2010 11:43 AM  Follow-up for Phone Call        Dr. Elnora Morrison received the last OV note, but they are needing a letter stating that it is ok in [place] opinion for pt to be under anesthesia for 4 hours. Please advsie. Carron Curie CMA  May 09, 2010 12:21 PM patient came in for her Xolair and wanted to know if her letter was ready for pick up. Patient wanted to know if it could be faxed [phone] so that she didnt have to come back today. Patient can be reached at [phone]. Vedia Coffer  May 09, 2010   4:20 PM   Additional Follow-up for Phone Call Additional follow up Details #1::        Done Additional Follow-up by: [name] MD,  May 15, 2010 8:27 AM    Additional Follow-up for Phone Call Additional follow up Details #2::    Pt called again, wants to know the status of letter for anesthestia. Follow-up by: Darletta Moll,  May 10, 2010 11:29 AM

## 2010-05-31 NOTE — Progress Notes (Signed)
Summary: back pain, appt/ hla  Phone Note Call from Patient   Summary of Call: pt calls requesting appt today for back pain, states appr 3 wks ago"i hurt my back a little at work and they sent me home" states it got some better but now for appr 3 days it has been getting worse, when ask where pain is at exactly she states all over and " my sides". states nothing alleviates and movement aggravates. appt is given for this am. Initial call taken by: Marin Roberts RN,  May 01, 2010 9:46 AM  Follow-up for Phone Call        Agree with plan. Follow-up by: Julaine Fusi  DO,  May 01, 2010 10:09 AM

## 2010-05-31 NOTE — Assessment & Plan Note (Signed)
Summary: xoliar/cb  Nurse Visit   Allergies: 1)  ! * Ace Inhibitors/ Lisinopril  Medication Administration  Injection # 1:    Medication: Xolair (omalizumab) 150mg     Diagnosis: EXTRINSIC ASTHMA, UNSPECIFIED (ICD-493.00)    Route: SQ    Site: R deltoid    Exp Date: 04/2013    Lot #: [id number]    Mfr: Salome Spotted    Comments: 0.9 ML IN RIGHT ARM AND LEFT 225 MG CHARGED 96401     Given by: Dimas Millin IN ALLERGY LAB   Medication Administration  Injection # 1:    Medication: Xolair (omalizumab) 150mg     Diagnosis: EXTRINSIC ASTHMA, UNSPECIFIED (ICD-493.00)    Route: SQ    Site: R deltoid    Exp Date: 04/2013    Lot #: [id number]    Mfr: Salome Spotted    Comments: 0.9 ML IN RIGHT ARM AND LEFT 225 MG CHARGED 96401     Given by: Babette Relic SCOTT IN ALLERGY LAB  Appended Document: Orders Update    Clinical Lists Changes  Orders: Added new Service order of Administration xolair injection [phone]) - Signed

## 2010-05-31 NOTE — Letter (Signed)
Summary: [employer]  [name]   [place], [place] [address]   Phone: [phone]  Fax: [phone]    Today's Date: April 10, 2010  Name of Patient: April [name]  The above named patient had an injection visit today at: 4:15 pm.  Please take this into consideration when reviewing the time away from work/school.    Special Instructions:  Arly.Keller ] None  [  ] To be off the remainder of today, returning to the normal work / school schedule tomorrow.  [  ] To be off until the next scheduled appointment on ______________________.  [  ] Other ________________________________________________________________ ________________________________________________________________________   Sincerely yours,     Jason Coop

## 2010-05-31 NOTE — Progress Notes (Signed)
Summary: Surgical clearance  Phone Note Call from Patient Call back at Home Phone [phone]   Caller: Patient Summary of Call: Patient needs Dr Maple Hudson to provide letter for surgical clearance to [facility], fax [phone], for breast surgery. Initial call taken by: Leonette Monarch,  May 07, 2010 4:13 PM  Follow-up for Phone Call        [facility] Vernie Murders  May 07, 2010 4:36 PM  Pt states she is much better, no complaints and request Dr. Maple Hudson write letter she discussed with him at last OV for breast reduction. Please advise. thanks. Zackery Barefoot CMA  May 07, 2010 4:43 PM     Additional Follow-up for Phone Call Additional follow up Details #2::    Clearance - last OV note sent .  Follow-up by: [name] MD,  May 08, 2010 5:48 PM

## 2010-06-06 ENCOUNTER — Ambulatory Visit (INDEPENDENT_AMBULATORY_CARE_PROVIDER_SITE_OTHER): Payer: Self-pay

## 2010-06-06 ENCOUNTER — Encounter: Payer: Self-pay | Admitting: Internal Medicine

## 2010-06-06 ENCOUNTER — Telehealth (INDEPENDENT_AMBULATORY_CARE_PROVIDER_SITE_OTHER): Payer: Self-pay | Admitting: *Deleted

## 2010-06-06 DIAGNOSIS — J45909 Unspecified asthma, uncomplicated: Secondary | ICD-10-CM

## 2010-06-06 NOTE — Assessment & Plan Note (Signed)
Summary: April [name] inj//sh  Nurse Visit   Allergies: 1)  ! * Ace Inhibitors/ Lisinopril  Medication Administration  Injection # 1:    Medication: Xolair (omalizumab) 150mg     Diagnosis: EXTRINSIC ASTHMA, UNSPECIFIED (ICD-493.00)    Route: SQ    Site: R deltoid    Exp Date: 04/2013    Lot #: [id number]    Mfr: Salome Spotted    Comments: 0.9 ML IN RIGHT AND LEFT ARM 225MG  CHARGED [phone]    Given by: Dimas Millin IN ALLERGY LAB  Orders Added: 1)  Administration xolair injection [[id number]]   Medication Administration  Injection # 1:    Medication: Xolair (omalizumab) 150mg     Diagnosis: EXTRINSIC ASTHMA, UNSPECIFIED (ICD-493.00)    Route: SQ    Site: R deltoid    Exp Date: 04/2013    Lot #: [id number]    Mfr: Salome Spotted    Comments: 0.9 ML IN RIGHT AND LEFT ARM 225MG  CHARGED [id number]    Given by: Dimas Millin IN [facility]ERGY LAB  Orders Added: 1)  Administration xolair injection [[id number]]

## 2010-06-07 IMAGING — CR DG CERVICAL SPINE COMPLETE 4+V
7 series · 7 of 7 positions shown · non-contrast
Comparison: None

CLINICAL DATA: Neck and left arm pain.

CERVICAL SPINE - COMPLETE 4+ VIEW

[w c-spine lat]
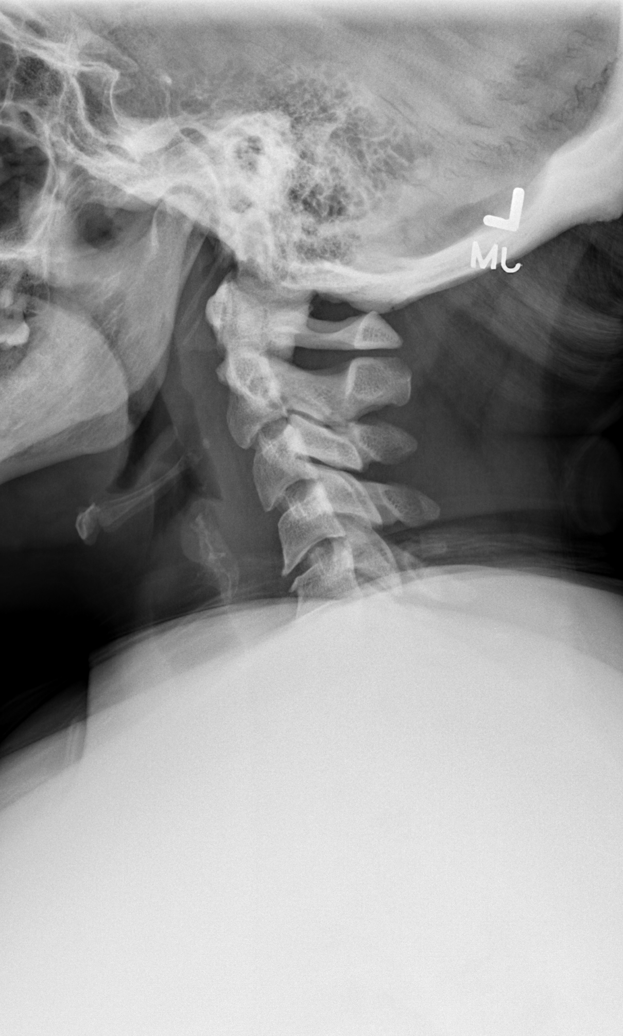

[w swimmers view]
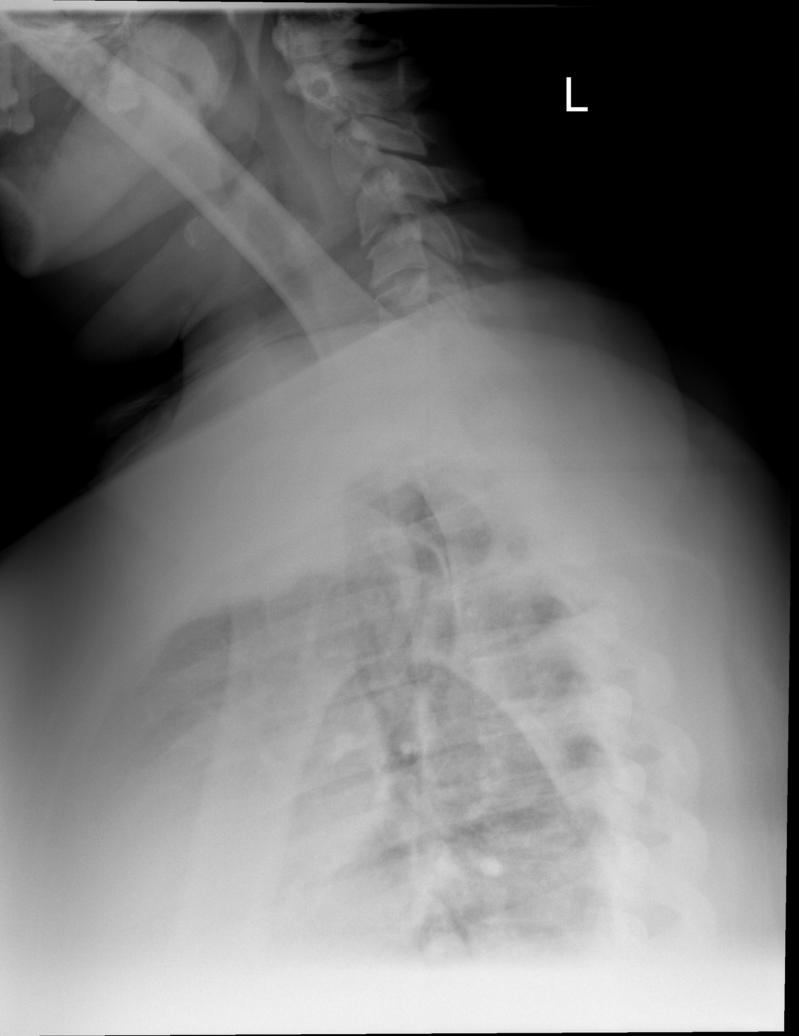

[w c-spine oblique (1 of 2)]
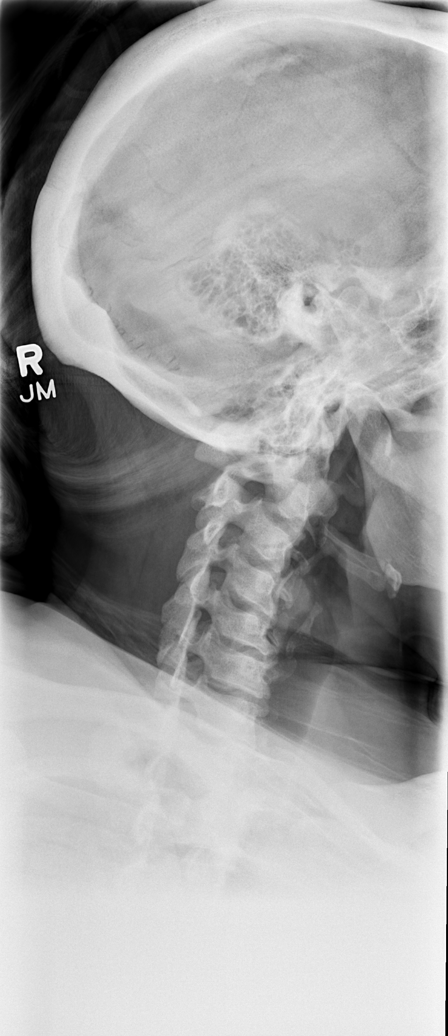

[w c-spine oblique (2 of 2)]
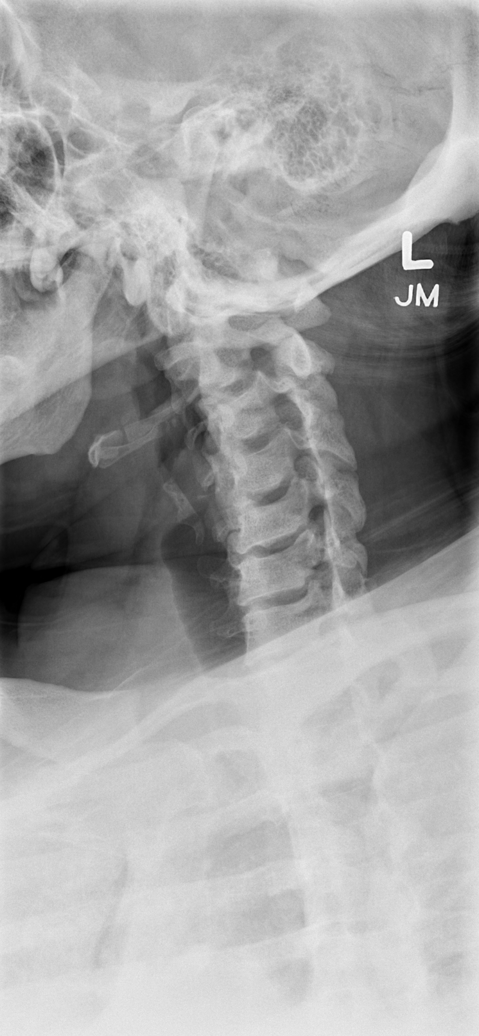

[w c-spine a.p.]
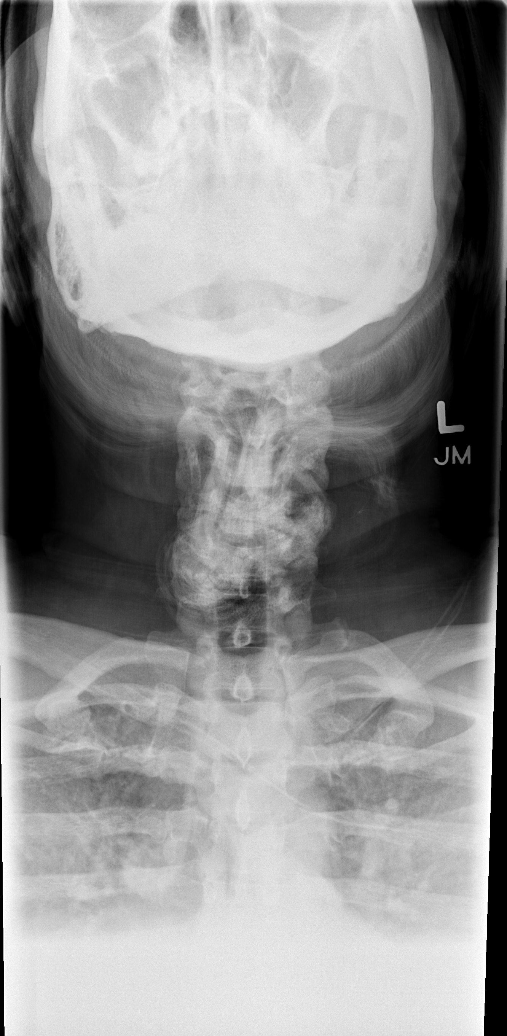

[w c-spine odontoid (1 of 2)]
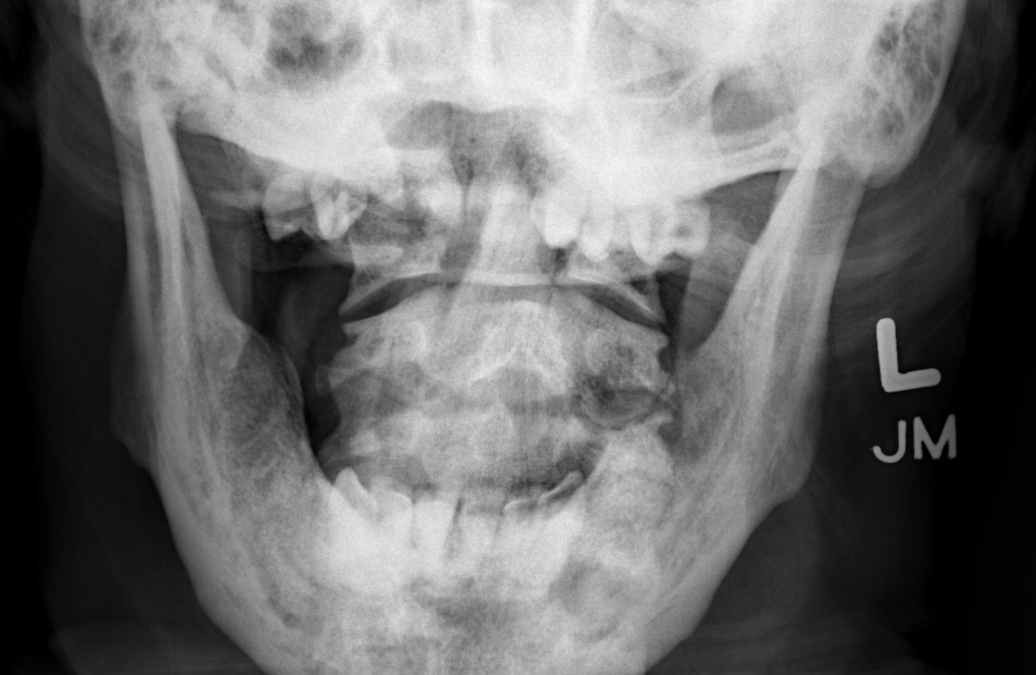

[w c-spine odontoid (2 of 2)]
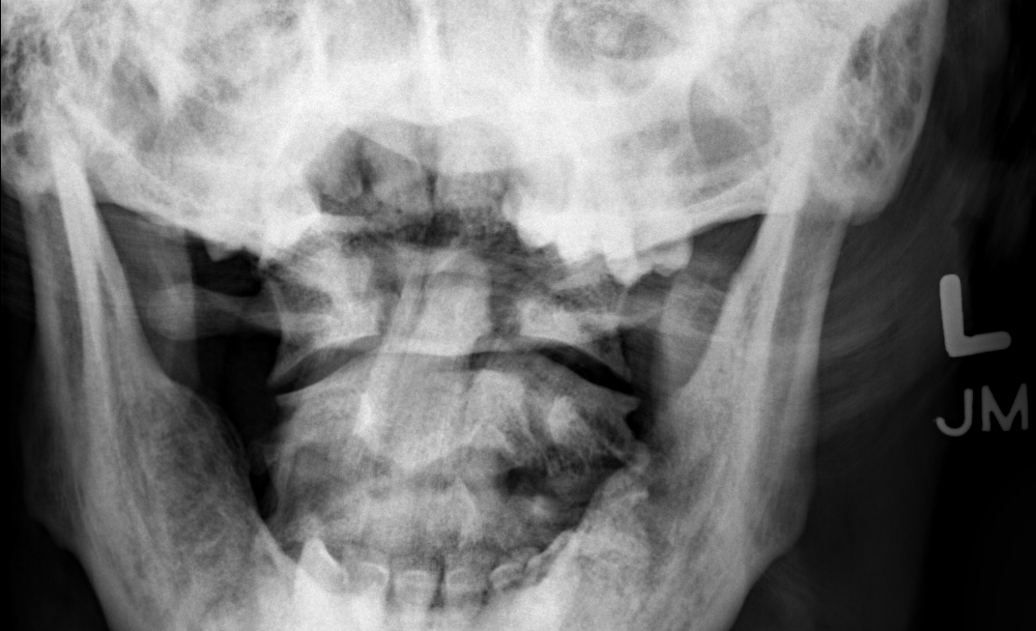

[7 of 7 positions shown; findings below may reference images not displayed]

FINDINGS: The lateral film is demonstrated reversal of the normal
cervical lordosis which could be due to positioning, muscle spasm
or pain.  There is degenerative cervical spondylosis with disc
disease and facet disease most notable at C4-5 and C5-6.  No acute
bony findings.  The facets are normally aligned and the neural
foramen are grossly patent.  The C1-2 articulations are maintained.
The lung apices are grossly clear.
IMPRESSION: 1.  Reversal of the normal cervical lordosis.
2.  Degenerative cervical spondylosis with disc disease and facet
disease most notable at C4-5 and C5-6.
3.  No acute bony findings or abnormal prevertebral soft tissue
swelling.

## 2010-06-11 ENCOUNTER — Inpatient Hospital Stay (INDEPENDENT_AMBULATORY_CARE_PROVIDER_SITE_OTHER)
Admission: RE | Admit: 2010-06-11 | Discharge: 2010-06-11 | Disposition: A | Discharge status: Home / Self Care | Payer: Self-pay | Source: Ambulatory Visit | Attending: Family Medicine | Admitting: Family Medicine

## 2010-06-11 DIAGNOSIS — J45909 Unspecified asthma, uncomplicated: Secondary | ICD-10-CM

## 2010-06-14 NOTE — Progress Notes (Signed)
Summary: samples  Phone Note Call from Patient   Caller: Patient Call For:  young Summary of Call: Patient is here now and wants a sample of her Provair, Atrovant and Dulera. she is here now waiting in the lobby. she just had her injection Initial call taken by: Vedia Coffer,  June 06, 2010 3:51 PM  Follow-up for Phone Call        1 sample of proair and 1 sample of Dulera given to pt.  Aundra Millet Reynolds LPN  June 06, 2010 4:10 PM

## 2010-06-14 NOTE — Assessment & Plan Note (Signed)
Summary: April [name] ///kp  Nurse Visit   Allergies: 1)  ! * Ace Inhibitors/ Lisinopril  Medication Administration  Injection # 1:    Medication: Xolair (omalizumab) 150mg     Diagnosis: EXTRINSIC ASTHMA, UNSPECIFIED (ICD-493.00)    Route: SQ    Site: L deltoid    Exp Date: 06/2013    Lot #: [id number]    Mfr: Genetech    Comments: 0.9 ML IN LEFT AND RIGHT ARM 225 MG CHARGED 96401     Given by: Dimas Millin IN ALLERGY LAB   Orders Added: 1)  Administration xolair injection [id number]   Medication Administration  Injection # 1:    Medication: Xolair (omalizumab) 150mg     Diagnosis: EXTRINSIC ASTHMA, UNSPECIFIED (ICD-493.00)    Route: SQ    Site: L deltoid    Exp Date: 06/2013    Lot #: [id number]    Mfr: Genetech    Comments: 0.9 ML IN LEFT AND RIGHT ARM 225 MG CHARGED 96401     Given by: Babette Relic SCOTT IN ALLERGY LAB   Orders Added: 1)  Administration xolair injection [[id number]]

## 2010-06-20 ENCOUNTER — Encounter: Payer: Self-pay | Admitting: Internal Medicine

## 2010-06-20 ENCOUNTER — Ambulatory Visit (INDEPENDENT_AMBULATORY_CARE_PROVIDER_SITE_OTHER): Payer: 59

## 2010-06-20 DIAGNOSIS — J45909 Unspecified asthma, uncomplicated: Secondary | ICD-10-CM

## 2010-06-20 DIAGNOSIS — J301 Allergic rhinitis due to pollen: Secondary | ICD-10-CM

## 2010-06-21 ENCOUNTER — Telehealth: Payer: Self-pay | Admitting: Internal Medicine

## 2010-06-25 ENCOUNTER — Ambulatory Visit (INDEPENDENT_AMBULATORY_CARE_PROVIDER_SITE_OTHER): Payer: 59 | Admitting: Ophthalmology

## 2010-06-25 ENCOUNTER — Encounter: Payer: Self-pay | Admitting: Ophthalmology

## 2010-06-25 DIAGNOSIS — F17200 Nicotine dependence, unspecified, uncomplicated: Secondary | ICD-10-CM

## 2010-06-25 DIAGNOSIS — R51 Headache: Secondary | ICD-10-CM

## 2010-06-25 DIAGNOSIS — J45909 Unspecified asthma, uncomplicated: Secondary | ICD-10-CM

## 2010-06-25 DIAGNOSIS — I10 Essential (primary) hypertension: Secondary | ICD-10-CM

## 2010-06-25 MED ORDER — ZOLPIDEM TARTRATE 10 MG PO TABS: 10.0000 mg | ORAL_TABLET | Freq: Every evening | ORAL | Status: DC | PRN

## 2010-06-25 MED ORDER — HYDROCHLOROTHIAZIDE 25 MG PO TABS: 25.0000 mg | ORAL_TABLET | Freq: Every day | ORAL | Status: DC

## 2010-06-25 NOTE — Progress Notes (Signed)
Subjective:    Patient ID: April [name], female    DOB: 04-May-1970, 40 y.o.   MRN: [id number]  HPI  This is a 40 year old female with a past medical history significant for asthma, hypertension, and morbid obesity , who presents for ER followup after having been seen on February 13 for an asthma attack.  The patient was started on a 6 day the prednisone course.  Pt has finished course of steroid and is feeling better.  Pt ran out of her BP meds last week and has not been on any  Had a preop evaluation last week and her systolic blood pressure was in the 180s at that time.   The patient's chief complaint today his headache which started about two weeks ago and has been intermittent in nature.  Pt has not had any visual changes.  Pain is a 6-7/10 and is achy.  Episodes last 5 mins and occur numerous times daily.  The pain is holocephalic. Pt has taken advil, tylenol, alieve and none have worked.     Review of Systems  Constitutional: Negative for fever and chills.  Respiratory: Negative for cough and shortness of breath.   Cardiovascular: Negative for chest pain and palpitations.  Gastrointestinal: Negative for vomiting, diarrhea and constipation.       Objective:   Physical Exam  Constitutional: She appears well-developed and well-nourished.  HENT:  Head: Normocephalic and atraumatic.  Eyes: Pupils are equal, round, and reactive to light.       Optic disks WNL.   Cardiovascular: Normal rate, regular rhythm and intact distal pulses.  Exam reveals no gallop and no friction rub.   No murmur heard. Pulmonary/Chest: Effort normal and breath sounds normal. She has no wheezes. She has no rales.  Abdominal: Soft. Bowel sounds are normal. She exhibits no distension. There is no tenderness.  Musculoskeletal: Normal range of motion.  Neurological: She is alert. No cranial nerve deficit.  Skin: No rash noted.   Current outpatient prescriptions:albuterol (PROAIR HFA) 108 (90 BASE) MCG/ACT inhaler,  Inhale 2 puffs into the lungs 4 (four) times daily as needed.  , Disp: , Rfl: ;  albuterol (PROVENTIL) (2.5 MG/3ML) 0.083% nebulizer solution, Take 2.5 mg by nebulization 4 (four) times daily as needed.  , Disp: , Rfl: ;  diphenhydrAMINE (COMPLETE ALLERGY RELIEF) 25 MG tablet, Take 25 mg by mouth every 6 (six) hours as needed. For itching , Disp: , Rfl:  ipratropium (ATROVENT HFA) 17 MCG/ACT inhaler, Inhale 2 puffs into the lungs 4 (four) times daily as needed. For rescue , Disp: , Rfl: ;  ipratropium (ATROVENT) 0.02 % nebulizer solution, Take 500 mcg by nebulization 4 (four) times daily as needed. For shortness of breath. Mix with albuterol in nebulizer and use within 30 minutes , Disp: , Rfl: ;  loratadine (CLARITIN) 10 MG tablet, Take 10 mg by mouth daily.  , Disp: , Rfl:  meloxicam (MOBIC) 7.5 MG tablet, Take 7.5 mg by mouth 2 (two) times daily as needed. For pain. Take with food , Disp: , Rfl: ;  Mometasone Furo-Formoterol Fum (DULERA) 200-5 MCG/ACT AERO, Inhale into the lungs 2 (two) times daily. For asthma , Disp: , Rfl: ;  omalizumab (XOLAIR) 150 MG injection, 300 mg every 14 (fourteen) days.  , Disp: , Rfl: ;  omeprazole (PRILOSEC) 40 MG capsule, Take 40 mg by mouth 2 (two) times daily.  , Disp: , Rfl:  valsartan (DIOVAN) 160 MG tablet, Take 160 mg by mouth daily.  ,  Disp: , Rfl: ;  zolpidem (AMBIEN) 10 MG tablet, Take 1 tablet (10 mg total) by mouth at bedtime as needed. To fall asleep, Disp: 30 tablet, Rfl: 5;  EPINEPHrine (EPIPEN) 0.3 mg/0.3 mL DEVI, Inject 0.3 mg into the muscle as needed. For severe allergic reaction , Disp: , Rfl: ;  hydrochlorothiazide 25 MG tablet, Take 1 tablet (25 mg total) by mouth daily., Disp: 30 tablet, Rfl: 11 DISCONTD: hydrochlorothiazide 25 MG tablet, Take 25 mg by mouth daily.  , Disp: , Rfl: ;  DISCONTD: zolpidem (AMBIEN) 10 MG tablet, Take 10 mg by mouth at bedtime as needed. To fall asleep , Disp: , Rfl:    Past Medical History  Diagnosis Date  . Acanthosis  nigricans   . Menorrhagia   . Asthma with exacerbation   . Hypertension, essential   . Pain, dental   . Insomnia   . Morbid obesity   . Helicobacter pylori (H. pylori) infection   . Sleep apnea   . COPD (chronic obstructive pulmonary disease)   . Asthma     skin test 02/01/09 pos hemorrhoids  . Depression   . GERD (gastroesophageal reflux disease)   . Right wrist pain   . Tobacco user       Assessment & Plan:

## 2010-06-25 NOTE — Assessment & Plan Note (Signed)
The patient has been completely off her medications for the last week because she states that she ran out of refills. The patient's blood pressure was elevated at her preop evaluation as well as today both initially and on recheck. I have restarted her hydrochlorothiazide and refilled that prescription today. The patient currently complains of headache which I believe is a combination of tension headache being exacerbated by her uncontrolled hypertension. I discussed the importance of daily medication use with the patient and instructed her to contact the clinic earlier she feels that she is running out of her prescriptions.

## 2010-06-25 NOTE — Patient Instructions (Signed)
Please follow up for a routine doctor visit in 2-3 months or call earlier if needed.

## 2010-06-25 NOTE — Assessment & Plan Note (Signed)
The patient states that she has stopped smoking, but is unable to avoid secondhand smoke.

## 2010-06-25 NOTE — Assessment & Plan Note (Signed)
The patient has had tension type headaches in the past , and this one has been off and on for 2 weeks. I think the patient's headache is likely being exacerbated by her uncontrolled hypertension, and I restarted the patient's blood pressure medication today. If the patient continues to have a headache she'll need further evaluation and consideration of other analgesics.

## 2010-06-25 NOTE — Assessment & Plan Note (Signed)
The patient is being followed by pulmonology recommended that she continue to do this. At this point in time, the patient's lungs are completely clear to auscultation without any wheezes and she appears to be under good control from an asthma standpoint. The patient is now off her prednisone. The patient is scheduled for surgery next week for breast reduction and this should help her hypoventilation syndrome , however I am somewhat concerned given her recent asthma exacerbation. The patient is not requesting medical clearance from [place] at this time,  I will defer this decision to her treating physicians.  I did fill out an FMLA form for the patient today stating that she would likely continue to have occasional asthma exacerbations requiring medical treatment. This will be scanned in her medical record.

## 2010-06-26 NOTE — Assessment & Plan Note (Signed)
Summary: Geoffry Paradise ///kp  Nurse Visit   Allergies: 1)  ! * Ace Inhibitors/ Lisinopril  Medication Administration  Injection # 1:    Medication: Xolair (omalizumab) 150mg     Diagnosis: EXTRINSIC ASTHMA, UNSPECIFIED (ICD-493.00)    Route: SQ    Site: R deltoid    Exp Date: 06/2013    Lot #: [id number]    Mfr: Salome Spotted    Comments: 0.9 ML IN RIGHT AND LEFT ARM 225MG  CHARGED [phone]    Given by: Dimas Millin IN ALLERGY LAB  Orders Added: 1)  Administration xolair injection [[id number]]   Medication Administration  Injection # 1:    Medication: Xolair (omalizumab) 150mg     Diagnosis: EXTRINSIC ASTHMA, UNSPECIFIED (ICD-493.00)    Route: SQ    Site: R deltoid    Exp Date: 06/2013    Lot #: [id number]    Mfr: [employer]    Comments: 0.9 ML IN RIGHT AND LEFT ARM 225MG  CHARGED [id number]    Given by: Dimas Millin IN ALLERGY LAB  Orders Added: 1)  Administration xolair injection [[id number]]

## 2010-06-26 NOTE — Progress Notes (Signed)
Summary: Xolair shots while in hosp.?--lmomtcb x 1  Phone Note Other Incoming   Caller: Pt. was in for her xolair yesterday. Details for Reason: Xolair shot after surgery? Summary of Call: April [name] will be due for a xolair shot the same time as her surgery(in two wks.) She is concerned she won't be able to get the shots til one or two wks. after that. She asked if one of the nurses at the hosp. could give them to her? (xolair 225 mg ) Please advise. Initial call taken by: Dimas Millin,  June 21, 2010 5:01 PM  Follow-up for Phone Call        Per CDY-No; she could get it a day early.Reynaldo Minium CMA  June 21, 2010 5:31 PM   lmomtcb x 1. Zackery Barefoot CMA  June 22, 2010 9:41 AM    Pt informed of above. She will come in the Monday before surgery. States will take off work for same since she is working 7a-7p. Zackery Barefoot CMA  June 22, 2010 10:13 AM

## 2010-06-27 ENCOUNTER — Telehealth: Payer: Self-pay | Admitting: Internal Medicine

## 2010-06-27 ENCOUNTER — Telehealth (INDEPENDENT_AMBULATORY_CARE_PROVIDER_SITE_OTHER): Payer: Self-pay | Admitting: *Deleted

## 2010-06-28 ENCOUNTER — Telehealth (INDEPENDENT_AMBULATORY_CARE_PROVIDER_SITE_OTHER): Payer: Self-pay | Admitting: *Deleted

## 2010-07-03 ENCOUNTER — Ambulatory Visit (HOSPITAL_BASED_OUTPATIENT_CLINIC_OR_DEPARTMENT_OTHER): Admission: RE | Admit: 2010-07-03 | Payer: 59 | Source: Ambulatory Visit | Admitting: Plastic Surgery

## 2010-07-05 NOTE — Progress Notes (Signed)
Summary: requests to see dr young/ asthma  Phone Note Call from Patient Call back at Home Phone [phone] [facility]     Caller: Patient Call For: young Summary of Call: pt wants to see dr young tomorrow (asthma). please call her asap today.  Initial call taken by: Tivis Ringer, CNA,  June 27, 2010 2:43 PM  Follow-up for Phone Call        duplicate message.  Pls see phone note from earlier today for additional refills.  Follow-up by: Gweneth Dimitri RN,  June 27, 2010 3:00 PM

## 2010-07-05 NOTE — Progress Notes (Signed)
Summary: REQUESTING TO BE WORKED IN [employer] Note Call from Patient Call back at [employer] [phone] P PH     Caller: Patient Call For: April [name] Summary of Call: Patient phoned she is suposed to have breast reduction surgery next Tuesday and she needs something in writting stating that she doesnt have anesthesia support because if not they will not perform the surgery. she will be staying overnight. she can be reached at [phone] Initial call taken by: Vedia Coffer,  June 27, 2010 2:15 PM  Follow-up for Phone Call        Called, spoke with pt.  She is requesting appt on tomorrow with CY to discuss surgery details.  States she has breast reduction surgery pending on March 6 but they will cancel it if they do not hear something from [facility] by tomorrow at 4pm.  Per pt, they are telling her he is requesting "anesthia support all night" but this is not available.  States only nursing support/mds available all night.  States they are needing clarrification on this.  She is requesting to come in tomorrow to see CDY to discuss this and "asthma" with him.  She is requesting call back ASAP.  I advise CDY is not in office this evening but will forward message to him to address in the AM. She verbalized understanding of this and aware someone will call her back in AM regarding CY's recs.  Will forward message to [name]/Dr. Maple Hudson.  Pls advise if pt can be worked in [employer].  Thanks!  Follow-up by: Gweneth Dimitri RN,  June 27, 2010 3:05 PM  Additional Follow-up for Phone Call Additional follow up Details #1::        Patient called back and wanted to know if she could come in and just sit in our lobby and wait until Dr Maple Hudson could speak to her, I advised the patient that Dr Maple Hudson was not in this afternoon and she hung up on me.Vedia Coffer  June 27, 2010 3:27 PM    Additional Follow-up for Phone Call Additional follow up Details #2::    Pt called back again after speaking with her  surgeon; "states they will not do the surgery". I spoke with CDY and he spoke with her regarding this matter over the phone.Reynaldo Minium CMA  June 27, 2010 4:56 PM

## 2010-07-05 NOTE — Progress Notes (Signed)
Summary: patient call about planned surgery  Phone Note Call from Patient   Summary of Call: Ms Traore called in regards to the letter I sent to Dr Thomos Lemons in md-January in regard to recommendations about safety for breast reduction surgery. I again explained that elective surgery on someone with a hx of unstable asthma and (at that time) recent missed work, would best be done where postop airway management could be provided. I explained that this decision should be based on discussion between her and her surgeon. Initial call taken by: [name] MD,  June 27, 2010 5:22 PM

## 2010-07-06 ENCOUNTER — Encounter: Payer: Self-pay | Admitting: Internal Medicine

## 2010-07-06 ENCOUNTER — Ambulatory Visit (INDEPENDENT_AMBULATORY_CARE_PROVIDER_SITE_OTHER): Payer: 59

## 2010-07-06 DIAGNOSIS — J45909 Unspecified asthma, uncomplicated: Secondary | ICD-10-CM

## 2010-07-09 ENCOUNTER — Ambulatory Visit: Payer: 59

## 2010-07-10 ENCOUNTER — Inpatient Hospital Stay (INDEPENDENT_AMBULATORY_CARE_PROVIDER_SITE_OTHER)
Admission: RE | Admit: 2010-07-10 | Discharge: 2010-07-10 | Disposition: A | Discharge status: Home / Self Care | Payer: 59 | Source: Ambulatory Visit | Attending: Family Medicine | Admitting: Family Medicine

## 2010-07-10 ENCOUNTER — Telehealth (INDEPENDENT_AMBULATORY_CARE_PROVIDER_SITE_OTHER): Payer: Self-pay | Admitting: *Deleted

## 2010-07-10 DIAGNOSIS — J45909 Unspecified asthma, uncomplicated: Secondary | ICD-10-CM

## 2010-07-10 DIAGNOSIS — R29818 Other symptoms and signs involving the nervous system: Secondary | ICD-10-CM

## 2010-07-10 NOTE — Progress Notes (Signed)
Summary: speak to nurse-  Phone Note Call from Patient Call back at Home Phone [phone] [facility]     Caller: Patient Call For: young Summary of Call: pt requests to speak to [facility] re: what dr. Roxy Cedar recs re: surgery. (because of pt's asthma).  Initial call taken by: Tivis Ringer, CNA,  June 28, 2010 9:36 AM  Follow-up for Phone Call        [facility] Vernie Murders  June 28, 2010 10:25 AM  Spoke with pt.  She states wanted to let CDY know that "whatever he wrote on that form made them cancel my surgery".  She states that she is upset b/c she spent alot of money on tests and ov's and now can not have surgery.  I advised that the ultimate decision to not do the surgery is not determined by [place], but by the surgeon performing the surgery. She verbalized understanding. Will forward to CDY so that he is aware.  Follow-up by: Vernie Murders,  June 28, 2010 10:36 AM  Additional Follow-up for Phone Call Additional follow up Details #1::        I had discussed this with patient. Not clear why it ws so long after my letter to Dr Thomos Lemons that the issue came up to me again. This is a decision between them.  Additional Follow-up by: [name] MD,  July 04, 2010 4:32 PM    Additional Follow-up for Phone Call Additional follow up Details #2::    Pt aware of the above.  Vernie Murders  July 04, 2010 4:46 PM

## 2010-07-10 NOTE — Assessment & Plan Note (Signed)
Summary: XOLAIR//SH  Nurse Visit   Allergies: 1)  ! * Ace Inhibitors/ Lisinopril  Medication Administration  Injection # 1:    Medication: Xolair (omalizumab) 150mg     Diagnosis: EXTRINSIC ASTHMA, UNSPECIFIED (ICD-493.00)    Route: SQ    Site: L deltoid    Exp Date: 07/2013    Lot #: [id number]    Mfr: Salome Spotted    Comments: 0.9 ML IN LEFT AND RIGHT ARM 225MG  CHARGED [phone]    Given by: Dimas Millin IN ALLERGY LAB  Orders Added: 1)  Administration xolair injection [id number]   Medication Administration  Injection # 1:    Medication: Xolair (omalizumab) 150mg     Diagnosis: EXTRINSIC ASTHMA, UNSPECIFIED (ICD-493.00)    Route: SQ    Site: L deltoid    Exp Date: 07/2013    Lot #: [id number]    Mfr: [employer]    Comments: 0.9 ML IN LEFT AND RIGHT ARM 225MG  CHARGED [phone]    Given by: Dimas Millin IN ALLERGY LAB  Orders Added: 1)  Administration xolair injection [[id number]]

## 2010-07-11 LAB — BASIC METABOLIC PANEL
BUN: 6 mg/dL (ref 6–23)
BUN: 8 mg/dL (ref 6–23)
Calcium: 8.7 mg/dL (ref 8.4–10.5)
Creatinine, Ser: 0.58 mg/dL (ref 0.4–1.2)
GFR calc Af Amer: >60 mL/min (ref >60)
GFR calc non Af Amer: >60 mL/min (ref >60)
Glucose, Bld: 200 mg/dL — ABNORMAL HIGH (ref 70–99)
Potassium: 3.4 mEq/L — ABNORMAL LOW (ref 3.5–5.1)

## 2010-07-11 LAB — CK TOTAL AND CKMB (NOT AT ARMC)
CK, MB: 2 ng/mL (ref 0.3–4.0)
Relative Index: 0.9 (ref 0.0–2.5)

## 2010-07-11 LAB — TROPONIN I: Troponin I: <0.01 ng/mL (ref 0.00–0.06)

## 2010-07-11 LAB — CBC
HCT: 35.2 % — ABNORMAL LOW (ref 36.0–46.0)
MCHC: 32.4 g/dL (ref 30.0–36.0)
Platelets: 447 10*3/uL — ABNORMAL HIGH (ref 150–400)
RBC: 4.18 MIL/uL (ref 3.87–5.11)
RDW: 16.6 % — ABNORMAL HIGH (ref 11.5–15.5)
RDW: 16.6 % — ABNORMAL HIGH (ref 11.5–15.5)
WBC: 21.7 10*3/uL — ABNORMAL HIGH (ref 4.0–10.5)

## 2010-07-11 LAB — RAPID URINE DRUG SCREEN, HOSP PERFORMED
Amphetamines: NOT DETECTED
Benzodiazepines: NOT DETECTED
Cocaine: NOT DETECTED
Tetrahydrocannabinol: NOT DETECTED

## 2010-07-11 LAB — POCT I-STAT, CHEM 8
HCT: 40 % (ref 36.0–46.0)
Hemoglobin: 13.6 g/dL (ref 12.0–15.0)
Potassium: 3.5 mEq/L (ref 3.5–5.1)
Sodium: 139 mEq/L (ref 135–145)

## 2010-07-11 LAB — URINALYSIS, ROUTINE W REFLEX MICROSCOPIC
Bilirubin Urine: NEGATIVE
Glucose, UA: NEGATIVE mg/dL
Hgb urine dipstick: NEGATIVE
Ketones, ur: NEGATIVE mg/dL
pH: 6 (ref 5.0–8.0)

## 2010-07-11 LAB — TSH: TSH: 0.931 u[IU]/mL (ref 0.350–4.500)

## 2010-07-11 LAB — DIFFERENTIAL
Basophils Absolute: 0 10*3/uL (ref 0.0–0.1)
Basophils Relative: 0 % (ref 0–1)
Eosinophils Absolute: 0 10*3/uL (ref 0.0–0.7)
Eosinophils Relative: 0 % (ref 0–5)
Monocytes Absolute: 0.2 10*3/uL (ref 0.1–1.0)
Neutro Abs: 13.8 10*3/uL — ABNORMAL HIGH (ref 1.7–7.7)

## 2010-07-12 ENCOUNTER — Encounter: Payer: Self-pay | Admitting: *Deleted

## 2010-07-12 ENCOUNTER — Telehealth: Payer: Self-pay | Admitting: *Deleted

## 2010-07-12 DIAGNOSIS — M549 Dorsalgia, unspecified: Secondary | ICD-10-CM

## 2010-07-12 LAB — GRAM STAIN

## 2010-07-12 LAB — CULTURE, ROUTINE-ABSCESS

## 2010-07-12 NOTE — Telephone Encounter (Signed)
Yes, she will need Plastic surgeon and she has already been cleared by [employer]. ( per pt)  She has had back problems for years. We will need to be sure MD is in network with [facility].

## 2010-07-12 NOTE — Telephone Encounter (Signed)
Pt called and wants  Referral for reduction mammoplasty.  She has [facility] insurance so needs to be in network.  She would like it done [place] or [place]. She went to Dr Sherald Hess but this doctor only does out pt surgery and April [name] states she will need over night care. Can you help with this?

## 2010-07-12 NOTE — Telephone Encounter (Signed)
Does she need a referral for plastic surgery?  I am not sure if insurance will pay for it unless it is medically necessary.

## 2010-07-17 NOTE — Progress Notes (Signed)
Summary: would like appt with Dr Maple Hudson or Tammy tomorrow  Phone Note Call from Patient   Caller: Patient Call For: YOUNG Summary of Call: Patient phoned stated that she was at the Urgent Care today due to her asthma and left arm pain. She wants to see Dr Maple Hudson or Tammy tomorrow. She can be reached at [phone] Initial call taken by: Vedia Coffer,  July 10, 2010 3:05 PM  Follow-up for Phone Call        Spoke with pt.  She states that she is currently waiting to be seen ay UC and is requesting appt with CDY or TP for tommorrow.  I advised that we have nothing available with either provider and explained to her why the schedule is so tight now.  I advised that she needs to stay at [facility] since she is already there and let them evaluate her for her SOB and follow the instructions they give her.  Pt verbalized understanding.  Follow-up by: Vernie Murders,  July 10, 2010 4:20 PM

## 2010-07-17 NOTE — Progress Notes (Signed)
Summary: asthma <<< seen at urgent care  Phone Note Call from Patient Call back at Home Phone [phone]   Caller: Patient Call For: young Summary of Call: Pt states her asthma is acting up wants to be seen today pls advise.//[facility] [address] Initial call taken by: Darletta Moll,  July 10, 2010 12:59 PM  Follow-up for Phone Call        Spoke with the patient and she is currently at urgent care for her asthma and will call back to schedule OV w/ Dr. Maple Hudson. Follow-up by: Michel Bickers CMA,  July 10, 2010 2:41 PM

## 2010-07-19 ENCOUNTER — Ambulatory Visit (INDEPENDENT_AMBULATORY_CARE_PROVIDER_SITE_OTHER): Payer: 59

## 2010-07-19 DIAGNOSIS — J45909 Unspecified asthma, uncomplicated: Secondary | ICD-10-CM

## 2010-07-19 MED ORDER — OMALIZUMAB 150 MG ~~LOC~~ SOLR
225.0000 mg | Freq: Once | SUBCUTANEOUS | Status: AC
  Administered 2010-07-19: 225 mg via SUBCUTANEOUS

## 2010-07-20 ENCOUNTER — Ambulatory Visit: Payer: 59

## 2010-07-23 ENCOUNTER — Other Ambulatory Visit: Payer: Self-pay | Admitting: *Deleted

## 2010-07-24 ENCOUNTER — Encounter: Payer: Self-pay | Admitting: Internal Medicine

## 2010-07-24 MED ORDER — OMEPRAZOLE 40 MG PO CPDR: 40.0000 mg | DELAYED_RELEASE_CAPSULE | Freq: Two times a day (BID) | ORAL | Status: DC

## 2010-08-02 ENCOUNTER — Ambulatory Visit (INDEPENDENT_AMBULATORY_CARE_PROVIDER_SITE_OTHER): Payer: 59

## 2010-08-02 DIAGNOSIS — J45909 Unspecified asthma, uncomplicated: Secondary | ICD-10-CM

## 2010-08-02 MED ORDER — OMALIZUMAB 150 MG ~~LOC~~ SOLR
225.0000 mg | Freq: Once | SUBCUTANEOUS | Status: AC
  Administered 2010-08-02: 225 mg via SUBCUTANEOUS

## 2010-08-03 LAB — D-DIMER, QUANTITATIVE: D-Dimer, Quant: 1.03 ug/mL-FEU — ABNORMAL HIGH (ref 0.00–0.48)

## 2010-08-05 LAB — POCT I-STAT, CHEM 8
Chloride: 104 mEq/L (ref 96–112)
Creatinine, Ser: 0.7 mg/dL (ref 0.4–1.2)
Glucose, Bld: 139 mg/dL — ABNORMAL HIGH (ref 70–99)
HCT: 35 % — ABNORMAL LOW (ref 36.0–46.0)
Hemoglobin: 11.6 g/dL — ABNORMAL LOW (ref 12.0–15.0)
Hemoglobin: 11.9 g/dL — ABNORMAL LOW (ref 12.0–15.0)
Potassium: 2.9 mEq/L — ABNORMAL LOW (ref 3.5–5.1)
Potassium: 3.9 mEq/L (ref 3.5–5.1)
Sodium: 138 mEq/L (ref 135–145)
Sodium: 140 mEq/L (ref 135–145)
TCO2: 26 mmol/L (ref 0–100)

## 2010-08-05 LAB — COMPREHENSIVE METABOLIC PANEL
ALT: 21 U/L (ref 0–35)
Alkaline Phosphatase: 61 U/L (ref 39–117)
BUN: 9 mg/dL (ref 6–23)
CO2: 26 mEq/L (ref 19–32)
Chloride: 105 mEq/L (ref 96–112)
GFR calc non Af Amer: >60 mL/min (ref >60)
Glucose, Bld: 158 mg/dL — ABNORMAL HIGH (ref 70–99)
Potassium: 4.2 mEq/L (ref 3.5–5.1)
Sodium: 139 mEq/L (ref 135–145)
Total Bilirubin: 0.4 mg/dL (ref 0.3–1.2)
Total Protein: 6.7 g/dL (ref 6.0–8.3)

## 2010-08-05 LAB — CBC
HCT: 34 % — ABNORMAL LOW (ref 36.0–46.0)
Hemoglobin: 11.3 g/dL — ABNORMAL LOW (ref 12.0–15.0)
RBC: 3.94 MIL/uL (ref 3.87–5.11)
RDW: 16.1 % — ABNORMAL HIGH (ref 11.5–15.5)
WBC: 12.3 10*3/uL — ABNORMAL HIGH (ref 4.0–10.5)

## 2010-08-05 LAB — DIFFERENTIAL
Basophils Absolute: 0 10*3/uL (ref 0.0–0.1)
Basophils Relative: 0 % (ref 0–1)
Eosinophils Absolute: 0 10*3/uL (ref 0.0–0.7)
Monocytes Relative: 3 % (ref 3–12)
Neutro Abs: 11.1 10*3/uL — ABNORMAL HIGH (ref 1.7–7.7)
Neutrophils Relative %: 90 % — ABNORMAL HIGH (ref 43–77)

## 2010-08-05 LAB — RAPID URINE DRUG SCREEN, HOSP PERFORMED
Amphetamines: NOT DETECTED
Barbiturates: NOT DETECTED
Opiates: NOT DETECTED

## 2010-08-06 ENCOUNTER — Emergency Department (HOSPITAL_COMMUNITY): Payer: 59

## 2010-08-06 ENCOUNTER — Emergency Department (HOSPITAL_COMMUNITY)
Admission: EM | Admit: 2010-08-06 | Discharge: 2010-08-06 | Discharge status: Against Medical Advice | Payer: 59 | Attending: Emergency Medicine | Admitting: Emergency Medicine

## 2010-08-06 ENCOUNTER — Observation Stay (HOSPITAL_COMMUNITY)
Admission: EM | Admit: 2010-08-06 | Discharge: 2010-08-06 | Disposition: A | Discharge status: Home / Self Care | Payer: 59 | Attending: Emergency Medicine | Admitting: Emergency Medicine

## 2010-08-06 DIAGNOSIS — I10 Essential (primary) hypertension: Principal | ICD-10-CM | POA: Insufficient documentation

## 2010-08-06 DIAGNOSIS — K219 Gastro-esophageal reflux disease without esophagitis: Secondary | ICD-10-CM | POA: Insufficient documentation

## 2010-08-06 DIAGNOSIS — E669 Obesity, unspecified: Secondary | ICD-10-CM | POA: Insufficient documentation

## 2010-08-06 DIAGNOSIS — J45909 Unspecified asthma, uncomplicated: Secondary | ICD-10-CM | POA: Insufficient documentation

## 2010-08-06 LAB — DIFFERENTIAL
Basophils Relative: 0 % (ref 0–1)
Monocytes Absolute: 0.9 10*3/uL (ref 0.1–1.0)
Monocytes Relative: 7 % (ref 3–12)
Neutro Abs: 9 10*3/uL — ABNORMAL HIGH (ref 1.7–7.7)

## 2010-08-06 LAB — POCT URINALYSIS DIP (DEVICE)
Ketones, ur: NEGATIVE mg/dL
Nitrite: NEGATIVE
Protein, ur: NEGATIVE mg/dL
Specific Gravity, Urine: 1.02 (ref 1.005–1.030)
Urobilinogen, UA: 0.2 mg/dL (ref 0.0–1.0)
pH: 5.5 (ref 5.0–8.0)
pH: 6.5 (ref 5.0–8.0)

## 2010-08-06 LAB — CARDIAC PANEL(CRET KIN+CKTOT+MB+TROPI): CK, MB: 3.9 ng/mL (ref 0.3–4.0)

## 2010-08-06 LAB — BASIC METABOLIC PANEL
BUN: 11 mg/dL (ref 6–23)
BUN: 8 mg/dL (ref 6–23)
CO2: 29 mEq/L (ref 19–32)
Calcium: 8.4 mg/dL (ref 8.4–10.5)
Creatinine, Ser: 0.73 mg/dL (ref 0.4–1.2)
GFR calc non Af Amer: >60 mL/min (ref >60)
Glucose, Bld: 168 mg/dL — ABNORMAL HIGH (ref 70–99)
Glucose, Bld: 77 mg/dL (ref 70–99)
Potassium: 3.9 mEq/L (ref 3.5–5.1)

## 2010-08-06 LAB — CBC
HCT: 37.4 % (ref 36.0–46.0)
HCT: 38.9 % (ref 36.0–46.0)
Hemoglobin: 12.9 g/dL (ref 12.0–15.0)
MCHC: 33.2 g/dL (ref 30.0–36.0)
MCV: 86.5 fL (ref 78.0–100.0)
MCV: 87.4 fL (ref 78.0–100.0)
Platelets: 348 10*3/uL (ref 150–400)
RBC: 4.49 MIL/uL (ref 3.87–5.11)
RDW: 15 % (ref 11.5–15.5)

## 2010-08-06 LAB — POCT PREGNANCY, URINE: Preg Test, Ur: NEGATIVE

## 2010-08-08 ENCOUNTER — Encounter: Payer: Self-pay | Admitting: Internal Medicine

## 2010-08-08 ENCOUNTER — Ambulatory Visit: Payer: 59 | Admitting: Internal Medicine

## 2010-08-08 ENCOUNTER — Emergency Department (HOSPITAL_COMMUNITY): Payer: 59

## 2010-08-08 ENCOUNTER — Inpatient Hospital Stay (HOSPITAL_COMMUNITY)
Admission: EM | Admit: 2010-08-08 | Discharge: 2010-08-09 | DRG: 202 | Disposition: A | Discharge status: Home / Self Care | Payer: 59 | Attending: Infectious Diseases | Admitting: Infectious Diseases

## 2010-08-08 DIAGNOSIS — G4700 Insomnia, unspecified: Secondary | ICD-10-CM | POA: Diagnosis present

## 2010-08-08 DIAGNOSIS — J069 Acute upper respiratory infection, unspecified: Secondary | ICD-10-CM | POA: Diagnosis present

## 2010-08-08 DIAGNOSIS — T380X5A Adverse effect of glucocorticoids and synthetic analogues, initial encounter: Secondary | ICD-10-CM | POA: Diagnosis present

## 2010-08-08 DIAGNOSIS — Z6841 Body Mass Index (BMI) 40.0 and over, adult: Secondary | ICD-10-CM

## 2010-08-08 DIAGNOSIS — K219 Gastro-esophageal reflux disease without esophagitis: Secondary | ICD-10-CM | POA: Diagnosis present

## 2010-08-08 DIAGNOSIS — F17200 Nicotine dependence, unspecified, uncomplicated: Secondary | ICD-10-CM | POA: Diagnosis present

## 2010-08-08 DIAGNOSIS — J45909 Unspecified asthma, uncomplicated: Secondary | ICD-10-CM

## 2010-08-08 DIAGNOSIS — E876 Hypokalemia: Secondary | ICD-10-CM | POA: Diagnosis present

## 2010-08-08 DIAGNOSIS — I10 Essential (primary) hypertension: Secondary | ICD-10-CM | POA: Diagnosis present

## 2010-08-08 DIAGNOSIS — J45901 Unspecified asthma with (acute) exacerbation: Principal | ICD-10-CM | POA: Diagnosis present

## 2010-08-08 DIAGNOSIS — D72829 Elevated white blood cell count, unspecified: Secondary | ICD-10-CM | POA: Diagnosis present

## 2010-08-08 DIAGNOSIS — D649 Anemia, unspecified: Secondary | ICD-10-CM | POA: Diagnosis present

## 2010-08-08 LAB — FOLATE: Folate: 10.5 ng/mL

## 2010-08-08 LAB — DIFFERENTIAL
Basophils Absolute: 0 10*3/uL (ref 0.0–0.1)
Basophils Relative: 0 % (ref 0–1)
Eosinophils Absolute: 0 10*3/uL (ref 0.0–0.7)
Neutrophils Relative %: 78 % — ABNORMAL HIGH (ref 43–77)

## 2010-08-08 LAB — POCT I-STAT, CHEM 8
Calcium, Ion: 1.09 mmol/L — ABNORMAL LOW (ref 1.12–1.32)
Chloride: 107 mEq/L (ref 96–112)
HCT: 38 % (ref 36.0–46.0)
Hemoglobin: 12.9 g/dL (ref 12.0–15.0)
TCO2: 20 mmol/L (ref 0–100)

## 2010-08-08 LAB — MAGNESIUM: Magnesium: 1.9 mg/dL (ref 1.5–2.5)

## 2010-08-08 LAB — IRON AND TIBC: Iron: 20 ug/dL — ABNORMAL LOW (ref 42–135)

## 2010-08-08 LAB — POCT I-STAT 3, ART BLOOD GAS (G3+)
Bicarbonate: 21.4 mEq/L (ref 20.0–24.0)
Patient temperature: 98.6
pH, Arterial: 7.398 (ref 7.350–7.400)
pO2, Arterial: 77 mmHg — ABNORMAL LOW (ref 80.0–100.0)

## 2010-08-08 LAB — CBC
Platelets: 356 10*3/uL (ref 150–400)
RBC: 4.42 MIL/uL (ref 3.87–5.11)
WBC: 17.3 10*3/uL — ABNORMAL HIGH (ref 4.0–10.5)

## 2010-08-08 LAB — FERRITIN: Ferritin: 8 ng/mL — ABNORMAL LOW (ref 10–291)

## 2010-08-08 NOTE — H&P (Signed)
Hospital Admission Note Date: 08/08/2010  Patient name: April [name] Medical record number: [id number] Date of birth: [birth year] Age: 40 y.o. Gender: female PCP: Carrolyn Meiers, MD  Medical Service: Internal Medicine  Attending physician: Dr. Lina Sayre      Resident [phone]):  Dr. Scot Dock  Pager: [phone] Resident (R1):  Dr. Allena Katz  Pager: [phone]  Chief Complaint: Asthma exacerbation  History of Present Illness: Pt is a 40 year old AAF with a PMHx of asthma with recurrent hospitalizations for exacerbations, morbid obesity, and HTN who presents to [facility] ED with complaints of shortness of breath, cough, and wheezing. Specifically, patient indicates that 38 days prior to admission, she was in the ED for these symptoms, and was discharged on Prednisone. She went home and took 60 mg of Prednisone, along with her nebulizer treatments, and noticed no improvement in her symptoms. Pt was still short of breath which occured both at rest and with activity, wheezing - requiring increase Albuterol administation, nasal congestion with rhinnorhea, nonproductive cough, and body aches. Also confirms dull, non radiating mid-chest pain that is worse with deep inspiration with no associated palpitations, consistent with pain felt during acute exacerbations. Has had multiple prior admissions for asthma exacerbation, last admission was 02/2010, and countless ED visits for the same. Has never required intubation during these acute exacerbations. Exacerbations usually precipitated by URI. Because of her persistent asthma, patient is followed by Dr. Jetty Duhamel, pulmonology, and is on Albuterol, Atrovent, Dulera, and receiving biweekley Xolair injections. States she is requiring less frequent hospitalizations for asthma exacerbation since starting the Xolair shots.   Notably, she states she takes her medications as directed. States her nephew is at home with a cold. No recent long-distance travel, no use of OCP. No fever, chills,  nausea, vomiting, or diarrhea.  States had had her flu shot this season, and Pneumovax within the last 1 year.    Current Outpatient Prescriptions  Medication Sig Dispense Refill  . albuterol (PROAIR HFA) 108 (90 BASE) MCG/ACT inhaler Inhale 2 puffs into the lungs 4 (four) times daily as needed.        [name] albuterol (PROVENTIL) (2.5 MG/3ML) 0.083% nebulizer solution Take 2.5 mg by nebulization 4 (four) times daily as needed.        . diphenhydrAMINE (COMPLETE ALLERGY RELIEF) 25 MG tablet Take 25 mg by mouth every 6 (six) hours as needed. For itching       . EPINEPHrine (EPIPEN) 0.3 mg/0.3 mL DEVI Inject 0.3 mg into the muscle as needed. For severe allergic reaction       . hydrochlorothiazide 25 MG tablet Take 1 tablet (25 mg total) by mouth daily.  30 tablet  11  . ipratropium (ATROVENT HFA) 17 MCG/ACT inhaler Inhale 2 puffs into the lungs 4 (four) times daily as needed. For rescue       . ipratropium (ATROVENT) 0.02 % nebulizer solution Take 500 mcg by nebulization 4 (four) times daily as needed. For shortness of breath. Mix with albuterol in nebulizer and use within 30 minutes       . loratadine (CLARITIN) 10 MG tablet Take 10 mg by mouth daily.        . meloxicam (MOBIC) 7.5 MG tablet Take 7.5 mg by mouth 2 (two) times daily as needed. For pain. Take with food       . Mometasone Furo-Formoterol Fum (DULERA) 200-5 MCG/ACT AERO Inhale into the lungs 2 (two) times daily. For asthma       . omalizumab Geoffry Paradise)  150 MG injection 300 mg every 14 (fourteen) days.        [name] omeprazole (PRILOSEC) 40 MG capsule Take 1 capsule (40 mg total) by mouth 2 (two) times daily.  60 capsule  6  . zolpidem (AMBIEN) 10 MG tablet Take 1 tablet (10 mg total) by mouth at bedtime as needed. To fall asleep  30 tablet  5    Allergies: Ace inhibitors  Past Medical History  Diagnosis Date  . Acanthosis nigricans   . Menorrhagia, hx of   . Asthma with exacerbation   . Hypertension, essential   . Insomnia   . Morbid  obesity   . Helicobacter pylori (H. pylori) infection, hx of 2007  . Sleep apnea - mild, last sleep study done 2008 which showed it was mild and likely related to obesity, no CPAP required   . Asthma     skin test 02/01/09 pos  Followed by Dr. Maple Hudson, considered severe asthma associated with atopia Last documented PFTs from 2002 which showed obstructive and restrictive pattern, no post bronchodilator performed  . Anemia - anemia panel done Sept. 2009 c/w iron deficiency    . GERD (gastroesophageal reflux disease)     Past Surgical History  Procedure Date  . Tubal ligation 1996    bilateral    Family History  Problem Relation Age of Onset  . Asthma Daughter   . Cancer Paternal Aunt   . Asthma Maternal Grandmother   Mother - with HTN, alive Father - unknown  History   Social History  . Marital Status: Single  . Years of Education: 11th grade   Occupational History  . 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 Smoking status: Former Smoker - quit 3 years ago, previously smoked 10 cig per day for several years    Types: Cigarettes    Quit date: 06/26/2007  . Smokeless tobacco: Former Neurosurgeon    Quit date: 06/26/2007  . Alcohol Use: No  . Drug Use: No  . Sexually Active: Occasionally     Social History Narrative   Daughter in high school, not married    Review of Systems: Pertinent items are noted in HPI.  Physical Exam:  Vitals:  T:98.6  HR:113  BP 159/91  O2 sats: 99% on RA  Head: Normocephalic, without obvious abnormality, atraumatic Eyes: conjunctivae/corneas clear. PERRL, EOM's intact. Fundi benign. Ears: normal TM's and external ear canals both ears and some cerumen impaction bilaterally Throat: lips, mucosa, and tongue normal; teeth and gums normal and dentures on top in place Neck: no adenopathy, no carotid bruit, no JVD, supple,  symmetrical, trachea midline and thyroid not enlarged, symmetric, no tenderness/mass/nodules Lungs: wheezes bilaterally and coarse breath sounds, upper airway expiratory wheezes heard, no dullness, no crackles, Heart: tachycardic, regular rhythm, no M/G/R Abdomen: soft, non-tender; bowel sounds normal; no masses,  no organomegaly and obese Extremities: extremities normal, atraumatic, no cyanosis or edema Pulses: 2+ and symmetric Skin: Skin color, texture, turgor normal. No rashes or lesions Neurologic: Grossly normal   Lab results:  Comprehensive Metabolic Panel:    Component Value Date/Time   NA 140 08/08/2010 0533   K 3.3* 08/08/2010 0533   CL 107 08/08/2010 0533   CO2 27 03/08/2010 2027   BUN 13 08/08/2010 0533   CREATININE 0.6 08/08/2010 0533   GLUCOSE 141* 08/08/2010 0533   CBC:  Component Value Date/Time   WBC 17.3* 08/08/2010 0525   HGB 12.9 08/08/2010 0533   HCT 38.0 08/08/2010 0533   PLT 356 08/08/2010 0525   MCV 80.1 08/08/2010 0525   % Neutrophils NEUTROABS 78% (normal is 43-77) 13.5* (ANC)  08/08/2010 0525   LYMPHSABS 2.6 08/08/2010 0525   MONOABS 1.2* 08/08/2010 0525   EOSABS 0.0 08/08/2010 0525   BASOSABS 0.0 08/08/2010 0525    Patient Temperature                      98.6 F  pH, Blood Gas                            7.398             7.350-7.400  pCO2                                     34.8       l      35.0-45.0        mmHg  pO2, Blood Gas                           77.0       l      80.0-100.0       mmHg  Bicarbonate                              21.4              20.0-24.0        mEq/L  TCO2                                     22                0-100            mmol/L  Base Deficit                             3.0        h      0.0-2.0          mmol/L  Oxygen Saturation                        95.0                               %  Collection Site                          SEE NOTE.     RADIAL, ALLEN'S TEST ACCEPTABLE  Sample Type                               ARTERIAL   Imaging results:   2-View CXR 04/11:  IMPRESSION: Peribronchial thickening noted; lungs otherwise clear. 2-View CXR 04/09:  IMPRESSION: No acute cardiopulmonary process. Generalized osteosclerosis as noted previously.  Assessment & Plan by Problem:  1) Acute asthma exacerbation - likely secondary to acute URI in the setting of acute seasonal change, precipitated by allergies. In past, pt has hx of medication non-adherence, however states she has been taking her medications as directed, without missing any doses. Currently symptoms of nasal congestion, sore throat, nonproductive cough are most suggestive of viral infection. As well, CXR is negative for new infiltrates, therefore PNA unlikely. Also, patient afebrile. Chest pain is the same as described during prior asthma exacerbations, is without palpitations. - Will start Duonebs treatment scheduled, with peak flow monitoring  - Given severity of wheezing, will start Solumedrol IV q6hours  - Continue Flonase for nasal congestion, claritin for allergy management  - Peak flow meter at bedside  - Consider repeating PFTs as outpatient, as last PFTs did show obstructive component back in 2002  2) URI - likely viral, difficult to ascertain with such acute symptom onset. CXR negative for new infiltrates. - Continue supportive care, and monitor for now.  3)  GERD  - Continue on PPI  4)    HYPERTENSION  - No longer taking Valsartan, reason unknown. Will continue home meds of HCTZ  5) Leukocytosis - likely secondary to steroid use, as patient was given solu-medrol and prednisone 2 days prior to admission. ANC and %neu are elevated however. Pt afebrile, no infiltrates on CXR, will continue to monitor for now.   6) Hypokalemia - likely secondary to continued albuterol and HCTZ use. Will check Mg and replete as needed.   7)   INSOMNIA (ICD-780.52) - Continue home meds (Ambien)  10) VTE PROPH: Lovenox   Melida Quitter, M.D. PGY2   _____________________________________    I have seen and examined the patient. I reviewed the resident/fellow note and agree with the findings and plan of care as documented. My additions and revisions are included.   Signature:   _____________________________________   Internal medicine Teaching Service Attending  Date:    _____________________________________

## 2010-08-09 LAB — BASIC METABOLIC PANEL
BUN: 14 mg/dL (ref 6–23)
CO2: 24 mEq/L (ref 19–32)
GFR calc non Af Amer: >60 mL/min (ref >60)
Glucose, Bld: 143 mg/dL — ABNORMAL HIGH (ref 70–99)
Potassium: 3.9 mEq/L (ref 3.5–5.1)

## 2010-08-09 LAB — CBC
HCT: 36.8 % (ref 36.0–46.0)
Hemoglobin: 11.7 g/dL — ABNORMAL LOW (ref 12.0–15.0)
MCV: 78.6 fL (ref 78.0–100.0)
RDW: 18 % — ABNORMAL HIGH (ref 11.5–15.5)
WBC: 18.2 10*3/uL — ABNORMAL HIGH (ref 4.0–10.5)

## 2010-08-13 ENCOUNTER — Ambulatory Visit (INDEPENDENT_AMBULATORY_CARE_PROVIDER_SITE_OTHER): Payer: 59 | Admitting: Ophthalmology

## 2010-08-13 ENCOUNTER — Encounter: Payer: Self-pay | Admitting: *Deleted

## 2010-08-13 DIAGNOSIS — E876 Hypokalemia: Secondary | ICD-10-CM | POA: Insufficient documentation

## 2010-08-13 DIAGNOSIS — J45901 Unspecified asthma with (acute) exacerbation: Secondary | ICD-10-CM

## 2010-08-13 DIAGNOSIS — I10 Essential (primary) hypertension: Secondary | ICD-10-CM

## 2010-08-13 DIAGNOSIS — D649 Anemia, unspecified: Secondary | ICD-10-CM

## 2010-08-13 LAB — CBC
HCT: 36 % (ref 36.0–46.0)
Hemoglobin: 11.7 g/dL — ABNORMAL LOW (ref 12.0–15.0)
MCH: 26.2 pg (ref 26.0–34.0)
MCHC: 32.5 g/dL (ref 30.0–36.0)
MCV: 80.5 fL (ref 78.0–100.0)
RDW: 18.2 % — ABNORMAL HIGH (ref 11.5–15.5)

## 2010-08-13 LAB — BASIC METABOLIC PANEL
BUN: 11 mg/dL (ref 6–23)
Calcium: 8.4 mg/dL (ref 8.4–10.5)
Creat: 0.68 mg/dL (ref 0.40–1.20)
Glucose, Bld: 66 mg/dL — ABNORMAL LOW (ref 70–99)
Potassium: 3.3 mEq/L — ABNORMAL LOW (ref 3.5–5.3)

## 2010-08-13 MED ORDER — PREDNISONE 10 MG PO TABS: ORAL_TABLET | ORAL | Status: DC

## 2010-08-13 NOTE — Assessment & Plan Note (Signed)
The patient's potassium was 3.3 on hospital admission, she was repleted appropriately. I will recheck edema today.

## 2010-08-13 NOTE — Discharge Summary (Signed)
April [name], April [name]                  ACCOUNT NO.:  [id number]  MEDICAL RECORD NO.:  [id number]           PATIENT TYPE:  I  LOCATION:  5505                         FACILITY:  [facility]  PHYSICIAN:  Fransisco Hertz, M.D.  DATE OF BIRTH:  08-13-1970  DATE OF ADMISSION:  08/08/2010 DATE OF DISCHARGE:  08/09/2010                              DISCHARGE SUMMARY   PRIMARY CARE PHYSICIAN:  Clinton D. Young, MD, FCCP, FACP  DISCHARGE DIAGNOSES: 1. Asthma exacerbation most likely secondary to upper respiratory     viral infection. 2. Hypertension. 3. Hyperkalemia. 4. Anemia. 5. Gastroesophageal reflux disease. 6. Leukocytosis. 7. Insomnia. 8. Morbid obesity, BMI 55.  DISCHARGE MEDICATIONS: 1. Fluticasone 50 mcg nasal spray, Flonase 2 sprays nasal daily. 2. Potassium chloride 10 mEq p.o. daily. 3. Loratadine 10 mg p.o. daily. 4. Guaifenesin, Mucinex 600 mg tablets p.o. b.i.d. 5. Omeprazole 40 mg one capsule p.o. b.i.d. 6. HCTZ 25 mg one tablet p.o. daily. 7. Albuterol nebulizer 2.5 mg per 3 mL one nebulizer inhaled q.i.d. 8. Atrovent inhaler 2 puff inhaled q.i.d. 9. Dulera one puff inhaled b.i.d. 10.Ambien 10 mg p.o. at bedtime. 11.ProAir inhaler 2 puffs q.i.d. p.r.n. as needed for rescue inhaler. 12.Xolair 300 mg subcutaneous injections once every 2 weeks. 13.Prednisone taper starting with 50 mg a day and increasing 10 mg     every day until total 6 days of therapy. 14.Ferrous sulfate 325 mg one to two tablets p.o. b.i.d.  DISPOSITION AND FOLLOWUP:  Ms. Erichsen was discharged from [facility] in stable and improved condition.  She is to be followed up with Dr. Maple Hudson with Pulmonary at [facility] on Aug 31, 2010, at 9:15 a.m.  At this appointment, they will discuss resolution of the patient's symptoms and further respiratory symptoms that she has had since discharge.  She will also likely need to discuss further chronic conditions.  PROCEDURE PERFORMED: 1.  Chest x-ray, August 08, 2010, peribronchial thickening noted, lungs     otherwise clear. 2. 12-lead EKG, August 08, 2010, sinus rhythm with premature atrial     complexes, otherwise normal EKG.  CHIEF COMPLAINT:  Asthma exacerbation.  HISTORY OF PRESENT ILLNESS:  The patient is a 40 year old African American female with past medical history of asthma with recurrence of the hospitalization for exacerbations, morbid obesity, and hypertension who presents to the [facility] ED with complaints of shortness of breath, cough and wheezing.  Specifically, the patient indicates that 2 days prior to admission, she was in the ED for these symptoms and was discharged on prednisone.  She went home until 60 mg of prednisone along with some nebulizer treatments and noticed no improvement in her symptoms.  The patient was still short of breath, which occurred both at rest and with activity, wheezing, requiring increased albuterol administration.  Nasal congestion with rhinorrhea, nonproductive cough and body aches.  Also complains of dull, nonradiating mid chest pain that is worse with deep inspiration with no associated palpitations consistent with pain felt during acute exacerbations.  Has had multiple prior admissions for asthma exacerbation, last admission was November 2011 and countless  ED visits for the same.  Has never required intubation during this acute exacerbations.  Exacerbations are usually precipitated by upper respiratory infection.  Because of the present asthma, the patient is followed by Dr. Jetty Duhamel, Pulmonology, and is on albuterol, Atrovent, Dulera, and receiving bi-weekly Xolair injections.  She states she is requiring less frequent hospitalizations for asthma exacerbations since starting the Xolair shots.  Notably, she states she is taking her medications as directed.  States her nephew is at home with a cold.  No recent long distance travel.  No use of OCPs. No fever,  chills, nausea, vomiting or diarrhea.  States that her flu shot was within last 1 year.  PHYSICAL EXAMINATION:  VITAL SIGNS:  On admission, temperature 98.6, pulse 113, blood pressure 159/91, and O2 sat 99% on room air, and respirations 16 per minute. HEAD:  Normocephalic without obvious abnormality, atraumatic. EYES:  Conjunctiva, cornea is clear.  PERRL.  EOMI intact.  Fundi benign. EARS:  Normal TMs.  External ear canal of both ears and some cerumen impaction bilaterally. THROAT:  Lips mucosa intact.  Normal teeth and gums.  Normal dentures on top in place. NECK:  No adenopathy.  No carotid bruit.  No JVD.  Supple, symmetrical. Trachea midline.  Thyroid not enlarged symmetric.  No tenderness, masses, or nodules. LUNGS:  Wheezes bilaterally.  Coarse breath sounds.  Upper airway expiratory wheezes heard.  No dullness.  No crackles. HEART:  Tachycardic, regular rhythm.  No murmur, rubs, or gallops. ABDOMEN:  Soft and nontender.  Bowel sounds normal.  No masses.  No organomegaly and obese. EXTREMITIES:  Normal atraumatic.  No cyanosis or edema.  Pulses, 2+ symmetric bilaterally. SKIN:  Color texture.  Turgor normal.  No rashes or lesions. NEUROLOGIC:  Grossly normal.  LAB RESULTS:  On admission, BMET, sodium 140, potassium 3.3, chloride 107, bicarb 27, BUN 30, creatinine 0.6, glucose 141.  CBC, WBC 17.3, hemoglobin 12.9, hematocrit 38.0, platelet 356, MCV 80.1, absolute neutrophils 13.5.  A pH 7.398, PCO2 is 34.2, PO2 77, bicarb 21.4, O2 saturation 95 on ABG.  HOSPITAL COURSE BY PROBLEM: 1. Asthma exacerbation.  The patient presented with symptoms that     suggested a possible exacerbation of asthma likely secondary to     acute upper respiratory infection in the setting of acute seasonal     change exacerbated by allergies.  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  She was advised to wear protective     mask, but she does not.  She refused bi-weekly  injections of Xolair     which can produce for the patient to upper respiratory infection     and viral infections.  This diagnosis is supported by the patient's     symptoms of nasal congestion, sore throat, and a nonproductive     cough.  The patient remained afebrile and the chest x-ray revealed     no new infiltrates ruling out pneumonia.  The patient was treated     with scheduled DuoNeb, Solu-Medrol, Flonase, and Claritin to which     she responded appropriately.  She will likely need PFTs as an     outpatient as last PFTs did show some obstruction in 2002. 2. Upper respiratory infection, likely viral, but it is difficult to     ascertain with acute symptom onset.  Her chest x-ray did not reveal     any new infiltrates.  The patient was given supportive  respiratory     care. 3. Hypertension.  The patient's blood pressures were elevated on     admission and the patient was placed on home dose of HCTZ.  She was     originally prescribed valsartan, but admitted that she no longer     takes these medications.  The patient's antihypertensive will need     to be titrated as an outpatient setting.  She has a history of     noncompliance with medications and this should also be impressed. 4. Leukocytosis.  The patient's WBC count including ANC was elevated     on admission.  This is attributed to her steroid use and she was     given Solu-Medrol 2 days prior to admission and remained afebrile.     We will discharge her home on a continued prednisone taper. 5. Hyperkalemia most likely secondary to albuterol and HCTZ use.  The     patient's potassium was repeated appropriately. 6. Anemia.  The patient has a history of iron deficiency with a     baseline hemoglobin of 11-12. The patient's hemoglobin decreased     overnight and anemia workup revealed low MCV, ferritin, and iron     values with B12 and folate within normal limits.  The patient will     be prescribed ferrous sulfate on  discharge.  This chronic condition     will need to be addressed as an outpatient.  DISCHARGE VITALS:  Temperature 98.0, pulse 86, respiratory rate 20, blood pressure 125/68, and O2 sats 100% on room air.  DISCHARGE LABS:  Sodium 136, potassium 3.9, chloride 104, bicarb 24, glucose 143, BUN 14, creatinine 0.67, calcium 9.0, magnesium 1.9.  CBC, WBC 18.2, hemoglobin 11.7, hematocrit 36.8, and platelet 407.  Anemia panel, iron 20, TIBC 155, percentage saturation 30, UIBC 135, vitamin B12 is 310, folate 10.5, and ferritin 8.    ______________________________ Lyn Hollingshead, MD   ______________________________ Fransisco Hertz, M.D.    RP/MEDQ  D:  08/09/2010  T:  08/10/2010  Job:  [id number]  cc:   Joni Fears D. Maple Hudson, MD, [employer], FACP  Electronically Signed by Lyn Hollingshead MD on 08/12/2010 11:14:24 AM Electronically Signed by Lina Sayre M.D. on 08/13/2010 12:39:41 PM

## 2010-08-13 NOTE — Progress Notes (Unsigned)
Pt presents asking for a note to go back to work other than the one she has for light duty, states her job does not have light duty also states she needs refill on prednisone. She did not have a hosp f/u setup so will add to pm schedule

## 2010-08-13 NOTE — Progress Notes (Signed)
Subjective:    Patient ID: April [name], female    DOB: 09-Apr-1971, 40 y.o.   MRN: [id number]  HPI  This is a 40 year old female with a past medical history significant for asthma, morbid obesity, and hypertension, who presents for hospital followup. The patient was admitted to the hospital on 08/08/2010, and discharged on 08/09/2010, for an asthma exacerbation likely secondary to upper respiratory viral infection. The patient was discharged on a prednisone taper, as well has continued use of her Provera, Xolair, dulera,  albuterol and Atrovent nebulizers, and fluticasone. Since her discharge, the patient has completed her course of 6 days of prednisone, but has noted continued symptoms. The patient continues to complain of wheezing though her cough has improved somewhat. The patient complains of difficulty sleeping at night because of her wheezing and shortness of breath. The patient has not been working since her hospitalization because she was discharged with light duties of which her work does not have. The patient requests another 2 days off work and a continuation of her prednisone taper which she felt was helping. The patient is taking all of her medications regularly otherwise.  Review of Systems  Constitutional: Negative for fever and chills.  Respiratory: Positive for cough and shortness of breath.   Cardiovascular: Negative for chest pain and palpitations.  Gastrointestinal: Negative for vomiting, diarrhea and constipation.       Objective:   Physical Exam  Constitutional: She appears well-developed and well-nourished.  HENT:  Head: Normocephalic and atraumatic.  Eyes: Pupils are equal, round, and reactive to light.  Cardiovascular: Normal rate, regular rhythm and intact distal pulses.  Exam reveals no gallop and no friction rub.   No murmur heard. Pulmonary/Chest: Effort normal. She has wheezes. She has no rales.       The patient has normal respiratory effort, but bilateral  diffuse expiratory wheezes. The patient appears to be moving air well, without accessory muscle use.  Abdominal: Soft. Bowel sounds are normal. She exhibits no distension. There is no tenderness.  Musculoskeletal: Normal range of motion.  Neurological: She is alert. No cranial nerve deficit.  Skin: No rash noted.       Current Outpatient Prescriptions  Medication Sig Dispense Refill  . albuterol (PROAIR HFA) 108 (90 BASE) MCG/ACT inhaler Inhale 2 puffs into the lungs 4 (four) times daily as needed.        [name] albuterol (PROVENTIL) (2.5 MG/3ML) 0.083% nebulizer solution Take 2.5 mg by nebulization 4 (four) times daily as needed.        . diphenhydrAMINE (COMPLETE ALLERGY RELIEF) 25 MG tablet Take 25 mg by mouth every 6 (six) hours as needed. For itching       . EPINEPHrine (EPIPEN) 0.3 mg/0.3 mL DEVI Inject 0.3 mg into the muscle as needed. For severe allergic reaction       . hydrochlorothiazide 25 MG tablet Take 1 tablet (25 mg total) by mouth daily.  30 tablet  11  . ipratropium (ATROVENT HFA) 17 MCG/ACT inhaler Inhale 2 puffs into the lungs 4 (four) times daily as needed. For rescue       . ipratropium (ATROVENT) 0.02 % nebulizer solution Take 500 mcg by nebulization 4 (four) times daily as needed. For shortness of breath. Mix with albuterol in nebulizer and use within 30 minutes       . loratadine (CLARITIN) 10 MG tablet Take 10 mg by mouth daily.        . meloxicam (MOBIC) 7.5 MG tablet Take 7.5  mg by mouth 2 (two) times daily as needed. For pain. Take with food       . Mometasone Furo-Formoterol Fum (DULERA) 200-5 MCG/ACT AERO Inhale into the lungs 2 (two) times daily. For asthma       . omalizumab (XOLAIR) 150 MG injection 300 mg every 14 (fourteen) days.        [name] omeprazole (PRILOSEC) 40 MG capsule Take 1 capsule (40 mg total) by mouth 2 (two) times daily.  60 capsule  6  . valsartan (DIOVAN) 160 MG tablet Take 160 mg by mouth daily.        [name] zolpidem (AMBIEN) 10 MG tablet Take 1 tablet  (10 mg total) by mouth at bedtime as needed. To fall asleep  30 tablet  5   Past Medical History  Diagnosis Date  . Acanthosis nigricans   . Menorrhagia   . Asthma with exacerbation   . Hypertension, essential   . Pain, dental   . Insomnia   . Morbid obesity   . Helicobacter pylori (H. pylori) infection   . Sleep apnea   . COPD (chronic obstructive pulmonary disease)   . Asthma     skin test 02/01/09 pos hemorrhoids  . Depression   . GERD (gastroesophageal reflux disease)   . Right wrist pain   . Tobacco user      Assessment & Plan:

## 2010-08-13 NOTE — Assessment & Plan Note (Signed)
The patients blood pressure was within reasonable control today (BP: 138/86 mmHg ) and I will not make any adjustments to the patients anti-hypertensive regimen. I will continue to monitor and titrate the patients medications as needed at future visits.

## 2010-08-13 NOTE — Assessment & Plan Note (Addendum)
The patient continues to have wheezing and complaints of shortness of breath. Patient was given a 6 day prednisone taper at her discharge. I'll repeat this taper at this time as she may just simply require a longer steroid taper given the severity of her disease. I would like the patient to return in one week for further assessment. The patient states that she will plan to get her Xolair injection tomorrow, and is to followup with Dr. Annia Friendly on May 1. I do think the patient would benefit from 2 more days without work. I did instruct the patient on the importance of continued medication adherence with regards to her inhalers. The patients baseline peak flow is about 350, and are 400 avg today. Given the severity of the patient's disease, think is reasonable to refer her to an allergist today to assist [place] in determining triggers and for any further assistance in the management of her asthma. Because she is at her baseline with regards to peak flow, I will continue outpt mgt at this time. I will write the patient a work note to go back to work next Monday as she needs to get over her symptoms prior to being reexposed to her work allergens.

## 2010-08-13 NOTE — Assessment & Plan Note (Signed)
Lab Results  Component Value Date   HGB 11.7* 08/09/2010   The patients last hemoglobin as above, patient's baseline appears to be 11-12. Ferritin, iron, B12, and folate were all within normal limits while in the hospital. [facility] recheck a CBC today.

## 2010-08-13 NOTE — Patient Instructions (Signed)
I would like for you to return in one week to ensure improvement in your symptoms. If you develop severe shortness of breath, please go to the ED for further evaluation.

## 2010-08-14 ENCOUNTER — Telehealth: Payer: Self-pay | Admitting: Internal Medicine

## 2010-08-14 ENCOUNTER — Telehealth: Payer: Self-pay | Admitting: Ophthalmology

## 2010-08-14 ENCOUNTER — Ambulatory Visit (INDEPENDENT_AMBULATORY_CARE_PROVIDER_SITE_OTHER): Payer: 59

## 2010-08-14 DIAGNOSIS — E876 Hypokalemia: Secondary | ICD-10-CM

## 2010-08-14 DIAGNOSIS — J45909 Unspecified asthma, uncomplicated: Secondary | ICD-10-CM

## 2010-08-14 MED ORDER — OMALIZUMAB 150 MG ~~LOC~~ SOLR
225.0000 mg | Freq: Once | SUBCUTANEOUS | Status: AC
  Administered 2010-08-14: 225 mg via SUBCUTANEOUS

## 2010-08-14 MED ORDER — POTASSIUM CHLORIDE 10 MEQ PO TBCR: 10.0000 meq | EXTENDED_RELEASE_TABLET | Freq: Every day | ORAL | Status: DC

## 2010-08-14 NOTE — Telephone Encounter (Signed)
Called and lmom to make pt aware that no samples were avaliable at this time.

## 2010-08-14 NOTE — Telephone Encounter (Signed)
The patient was started on potassium 10 mEq daily in the hospital. I have informed the patient that she should continue taking her potassium supplementation and return in 2 weeks for repeat BMET. I'll determine whether or not this needs to be discontinued at that time. With regards to the patient's hemoglobin, it was 12.9 at the time of hospital admission, this decreased likely secondary to volume administration, and has been stable since discharge.

## 2010-08-15 ENCOUNTER — Telehealth: Payer: Self-pay | Admitting: Internal Medicine

## 2010-08-15 NOTE — Telephone Encounter (Signed)
No samples available at this time. Pt aware. 

## 2010-08-20 ENCOUNTER — Ambulatory Visit (INDEPENDENT_AMBULATORY_CARE_PROVIDER_SITE_OTHER): Payer: 59 | Admitting: Ophthalmology

## 2010-08-20 DIAGNOSIS — I10 Essential (primary) hypertension: Secondary | ICD-10-CM

## 2010-08-20 DIAGNOSIS — J45909 Unspecified asthma, uncomplicated: Secondary | ICD-10-CM

## 2010-08-20 MED ORDER — ZOLPIDEM TARTRATE 10 MG PO TABS: 10.0000 mg | ORAL_TABLET | Freq: Every evening | ORAL | Status: DC | PRN

## 2010-08-20 MED ORDER — VALSARTAN 160 MG PO TABS: 160.0000 mg | ORAL_TABLET | Freq: Every day | ORAL | Status: DC

## 2010-08-20 MED ORDER — HYDROCHLOROTHIAZIDE 25 MG PO TABS: 25.0000 mg | ORAL_TABLET | Freq: Every day | ORAL | Status: DC

## 2010-08-20 NOTE — Patient Instructions (Signed)
Please followup with Dr. Maple Hudson in 2 weeks. Call the clinic if you have worsening shortness of breath or asthma symptoms. Please finish your course of prednisone.

## 2010-08-20 NOTE — Assessment & Plan Note (Addendum)
This is been poorly controlled, and the patient was recently hospitalized for asthma exacerbation secondary to URI. I saw the patient one week ago, and prescribed her with another course of prednisone with instructions to take a break from work until this week. Since that time, the patient has been improving with regards to her symptoms, and her current peak flow is right around 500, which is up from her baseline. I will not make any adjustments in the patient's medications at this time, and I filled out an FMLA form for the time that she has been off of work. The patient is safe to go back to work now, and will followup with Dr. Maple Hudson in 2 weeks.

## 2010-08-20 NOTE — Assessment & Plan Note (Signed)
The patients blood pressure was within reasonable control today (BP: 120/78 mmHg ) and I will not make any adjustments to the patients anti-hypertensive regimen. I will continue to monitor and titrate the patients medications as needed at future visits.

## 2010-08-20 NOTE — Progress Notes (Signed)
Subjective:    Patient ID: April [name], female    DOB: 14-Jan-1971, 40 y.o.   MRN: [id number]  HPI  This is a 40 year old female with a past medical history significant for asthma, and hypertension, recently hospitalized for asthma exacerbation secondary to upper respiratory tract infection. The patient was seen by me approximately one week ago, and I prescribed her a continued prednisone taper for her symptoms. I also recommended that the patient abstain from working until this week as she works in a warehouse with significant dust. Since that visit, the patient has been feeling better, though she still uses her albuterol about 3 times a day. The patient did get her Xolair injection but has not noticed a difference in her wheezing as a result of this injection. The patient is scheduled to followup with Dr. Maple Hudson her pulmonologist on May 8. The patient denies any recent fevers chills, abdominal pain, though she does have shortness of breath, and occasional cough. The patient states that she feels that she is approximately 60% of her normal. The patient does have mild difficulty sleeping because of her asthma, but Ambien generally helps improve her insomnia.  Review of Systems  Constitutional: Negative for fever and chills.  Respiratory: Positive for cough and shortness of breath.   Cardiovascular: Negative for chest pain and palpitations.  Gastrointestinal: Negative for vomiting, diarrhea and constipation.       Objective:   Physical Exam  Constitutional: She appears well-developed and well-nourished.  HENT:  Head: Normocephalic and atraumatic.  Eyes: Pupils are equal, round, and reactive to light.  Cardiovascular: Normal rate, regular rhythm and intact distal pulses.  Exam reveals no gallop and no friction rub.   No murmur heard. Pulmonary/Chest: Effort normal. She has wheezes. She has no rales.       There are moderate bilateral wheezes, slightly improved from last week  Abdominal: Soft.  Bowel sounds are normal. She exhibits no distension. There is no tenderness.  Musculoskeletal: Normal range of motion.  Neurological: She is alert. No cranial nerve deficit.  Skin: No rash noted.         Current Outpatient Prescriptions on File Prior to Visit  Medication Sig Dispense Refill  . albuterol (PROAIR HFA) 108 (90 BASE) MCG/ACT inhaler Inhale 2 puffs into the lungs 4 (four) times daily as needed.        [name] albuterol (PROVENTIL) (2.5 MG/3ML) 0.083% nebulizer solution Take 2.5 mg by nebulization 4 (four) times daily as needed.        . diphenhydrAMINE (COMPLETE ALLERGY RELIEF) 25 MG tablet Take 25 mg by mouth every 6 (six) hours as needed. For itching       . EPINEPHrine (EPIPEN) 0.3 mg/0.3 mL DEVI Inject 0.3 mg into the muscle as needed. For severe allergic reaction       . ipratropium (ATROVENT HFA) 17 MCG/ACT inhaler Inhale 2 puffs into the lungs 4 (four) times daily as needed. For rescue       . ipratropium (ATROVENT) 0.02 % nebulizer solution Take 500 mcg by nebulization 4 (four) times daily as needed. For shortness of breath. Mix with albuterol in nebulizer and use within 30 minutes       . loratadine (CLARITIN) 10 MG tablet Take 10 mg by mouth daily.        . meloxicam (MOBIC) 7.5 MG tablet Take 7.5 mg by mouth 2 (two) times daily as needed. For pain. Take with food       . Mometasone Furo-Formoterol  Fum (DULERA) 200-5 MCG/ACT AERO Inhale into the lungs 2 (two) times daily. For asthma       . omalizumab (XOLAIR) 150 MG injection 300 mg every 14 (fourteen) days.        [name] omeprazole (PRILOSEC) 40 MG capsule Take 1 capsule (40 mg total) by mouth 2 (two) times daily.  60 capsule  6  . potassium chloride (KLOR-CON) 10 MEQ CR tablet Take 10 mEq by mouth daily.        . predniSONE (DELTASONE) 10 MG tablet Take 4 tablets by mouth daily for 3 days, 3 tablets daily x3 days, 2 tabs daily x3 days, 1 tab daily x3 days, stop.  30 tablet  0  . DISCONTD: hydrochlorothiazide 25 MG tablet Take 1  tablet (25 mg total) by mouth daily.  30 tablet  11  . DISCONTD: valsartan (DIOVAN) 160 MG tablet Take 160 mg by mouth daily.        [name] DISCONTD: zolpidem (AMBIEN) 10 MG tablet Take 1 tablet (10 mg total) by mouth at bedtime as needed. To fall asleep  30 tablet  5    Past Medical History  Diagnosis Date  . Acanthosis nigricans   . Menorrhagia   . Asthma with exacerbation   . Hypertension, essential   . Pain, dental   . Insomnia   . Morbid obesity   . Helicobacter pylori (H. pylori) infection   . Sleep apnea   . COPD (chronic obstructive pulmonary disease)   . Asthma     skin test 02/01/09 pos hemorrhoids  . Depression   . GERD (gastroesophageal reflux disease)   . Right wrist pain   . Tobacco user     Assessment & Plan:

## 2010-08-27 ENCOUNTER — Encounter: Payer: Self-pay | Admitting: Internal Medicine

## 2010-08-28 ENCOUNTER — Ambulatory Visit (INDEPENDENT_AMBULATORY_CARE_PROVIDER_SITE_OTHER): Payer: 59 | Admitting: Internal Medicine

## 2010-08-28 ENCOUNTER — Encounter: Payer: Self-pay | Admitting: *Deleted

## 2010-08-28 ENCOUNTER — Ambulatory Visit (INDEPENDENT_AMBULATORY_CARE_PROVIDER_SITE_OTHER): Payer: 59

## 2010-08-28 ENCOUNTER — Encounter: Payer: Self-pay | Admitting: Internal Medicine

## 2010-08-28 VITALS — BP 148/86 | HR 90 | Ht 66.0 in | Wt 363.2 lb

## 2010-08-28 DIAGNOSIS — J45909 Unspecified asthma, uncomplicated: Secondary | ICD-10-CM

## 2010-08-28 DIAGNOSIS — F17200 Nicotine dependence, unspecified, uncomplicated: Secondary | ICD-10-CM

## 2010-08-28 DIAGNOSIS — K219 Gastro-esophageal reflux disease without esophagitis: Secondary | ICD-10-CM

## 2010-08-28 MED ORDER — OMALIZUMAB 150 MG ~~LOC~~ SOLR
225.0000 mg | Freq: Once | SUBCUTANEOUS | Status: AC
  Administered 2010-08-28: 225 mg via SUBCUTANEOUS

## 2010-08-28 MED ORDER — MOMETASONE FURO-FORMOTEROL FUM 100-5 MCG/ACT IN AERO: 2.0000 | INHALATION_SPRAY | Freq: Two times a day (BID) | RESPIRATORY_TRACT | Status: DC

## 2010-08-28 MED ORDER — ALBUTEROL SULFATE HFA 108 (90 BASE) MCG/ACT IN AERS: 2.0000 | INHALATION_SPRAY | Freq: Four times a day (QID) | RESPIRATORY_TRACT | Status: DC | PRN

## 2010-08-28 NOTE — Assessment & Plan Note (Addendum)
Her weight is a major barrier to her health care and every available weight los opportunity should be considered carefully She is being evaluated at [facility] for bariatric surgery.

## 2010-08-28 NOTE — Progress Notes (Signed)
  Subjective:    Patient ID: April [name], female    DOB: Mar 05, 1971, 40 y.o.   MRN: [id number]  HPI 5 yoF former smoker followed for chronic severe asthma. Hospitalized 4/11-12/12 for another exacerbation she says was triggered by catching a cold. Common triggers include colds and any smoke exposure. Back now to baseline. She has continued Xolair injection here every 2 weeks and does feel it has reduced frequency of flare-ups. Usual weight range 320-340 and she asks why weight is up to 360. Discussed steroids, including IV therapy at hospital but also ?role of Dulera 200-5. She is now being seen a [facility] anticipating breast reduction surgery and we talked about bariatric surgery. Takes omeprazole twice daily before meals, but has frequent heart burn.    Review of Systems See HPI Constitutional:   No weight loss, night sweats,  Fevers, chills, fatigue, lassitude. HEENT:   No headaches,  Difficulty swallowing,  Tooth/dental problems,  Sore throat,                No sneezing, itching, ear ache, nasal congestion, post nasal drip,   CV:  No chest pain,  Orthopnea, PND, swelling in lower extremities, anasarca, dizziness, palpitations  GI  Frequent heart burn. Marland KitchenNo- abdominal pain, nausea, vomiting, diarrhea, change in bowel habits, loss of appetite  Resp:   No excess mucus, no productive cough,  No non-productive cough,  No coughing up of blood.  No change in color of mucus.  No wheezing.  Skin: no rash or lesions.  GU: no dysuria, change in color of urine, no urgency or frequency.  No flank pain.  MS:  No joint pain or swelling.  No decreased range of motion.  No back pain.  Psych:  No change in mood or affect. No depression or anxiety.  No memory loss.      Objective:   Physical Exam General- Alert, Oriented, Affect-appropriate, Distress- none acute  Morbidly obese  Skin- rash-none, lesions- none, excoriation- none  Lymphadenopathy- none  Head- atraumatic  Eyes- Gross vision intact,  PERRLA, conjunctivae clear, secretions  Ears- Normal-  Hearing, canals, Tm  Nose- Clear,  No- Septal dev, mucus, polyps, erosion, perforation   Throat- Mallampati II , mucosa clear , drainage- none, tonsils- atrophic  Neck- flexible , trachea midline, no stridor , thyroid nl, carotid no bruit  Chest - symmetrical excursion , unlabored     Heart/CV- RRR , no murmur , no gallop  , no rub, nl s1 s2                     - JVD- none , edema- none, stasis changes- none, varices- none     Lung- clear to P&A, wheeze- none, cough- none , dullness-none, rub- none     Chest wall-  Abd- tender-no, distended-no, bowel sounds-present, HSM- no  Br/ Gen/ Rectal- Not done, not indicated  Extrem- cyanosis- none, clubbing, none, atrophy- none, strength- nl  Neuro- grossly intact to observation         Assessment & Plan:

## 2010-08-28 NOTE — Assessment & Plan Note (Addendum)
Severe asthma- most recent exacerbation is resolved. Asthma does increase her surgical risk and it is appropriate to plan in-patient surgeries when possible.  Dulera and xolair have substantially improved control.

## 2010-08-28 NOTE — Patient Instructions (Signed)
Med refills sent to drug store  Good luck on the surgery

## 2010-08-28 NOTE — Assessment & Plan Note (Signed)
Control still sounds inadequate. I discussed management of meds, but she may need to see GI.

## 2010-08-30 ENCOUNTER — Ambulatory Visit: Payer: 59

## 2010-09-01 ENCOUNTER — Encounter: Payer: Self-pay | Admitting: Internal Medicine

## 2010-09-01 NOTE — Assessment & Plan Note (Signed)
She currently states she is no longer smoking.

## 2010-09-04 ENCOUNTER — Encounter: Payer: Self-pay | Admitting: Internal Medicine

## 2010-09-04 ENCOUNTER — Ambulatory Visit (INDEPENDENT_AMBULATORY_CARE_PROVIDER_SITE_OTHER): Payer: 59 | Admitting: Internal Medicine

## 2010-09-04 DIAGNOSIS — I10 Essential (primary) hypertension: Secondary | ICD-10-CM

## 2010-09-04 DIAGNOSIS — M549 Dorsalgia, unspecified: Secondary | ICD-10-CM

## 2010-09-04 MED ORDER — MELOXICAM 7.5 MG PO TABS: 7.5000 mg | ORAL_TABLET | Freq: Two times a day (BID) | ORAL | Status: DC

## 2010-09-04 MED ORDER — ACETAMINOPHEN-CODEINE 300-30 MG PO TABS: 1.0000 | ORAL_TABLET | ORAL | Status: DC | PRN

## 2010-09-04 NOTE — Patient Instructions (Addendum)
Please make a f/u appointment in 2-3 weeks. Please take Tylenol 3, every 4 hours as needed for pain.  Please take meloxicam 7.5 mg tablet 2 times a day after meals for next 2 weeks. you don't need to take total bed rest and can move around as tolerated, but try to avoid lifting heavy weights for next 5 days. You can return back to work on Monday without any restrictions if you're feeling better or call clinic and make an appointment.

## 2010-09-04 NOTE — Assessment & Plan Note (Signed)
The blood pressure was 160/100 today. Its been normal lately and so will not change anything for now and will follow up in the next visit and still elevated will make appropriate changes as needed.

## 2010-09-04 NOTE — Assessment & Plan Note (Signed)
April [name] has chronic back pain superimposed with acute attacks one of which she is having right now for about last month. It looks most likely like lumbosacral strain which is about 4 weeks and so there is no indication for doing an imaging at this point of time. Advised her to take a few days of until Sunday from work and don't a total bed rest but does much activity as she can, without strenuous work. Also prescribed her Tylenol #3 20 tablets and meloxicam 7.5 mg by mouth twice a day after meals for 2 weeks. Advised her to go back to work from Monday if she is feeling better or if she doesn't call the clinic to make an appointment. Also prescribed her about possible side effect of sedation with codeine content of Tylenol 3 and so she might have to be aware of this at work if she goes from Monday. She sounded understanding.

## 2010-09-04 NOTE — Progress Notes (Signed)
  Subjective:    Patient ID: April [name], female    DOB: 11-18-1970, 40 y.o.   MRN: [id number]  HPI April [name] is a 40 year old woman with a past with a history of asthma, hypertension, morbid obesity who comes the clinic with chief complaint of low back pain for about a month. He was hospitalized recently for asthma exacerbation at [facility] and sometime after discharge she started having back pain. The pain is in lower back, is continuous, achy, 8 to 10 out of 10, doesn't radiate to the legs or thighs, but does radiate to the left flank. The pain gets worse when she stands up for a while or walks. She gets severe pain before completing a simple grocery shopping and then the pain gets a little better with rest. 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 Denies fever, loss of bowel or bladder control, change in sensations in the thighs or groin, tingling numbness or weakness in his lower extremities, urinary abnormalities. She has a surgery for gastric bypass at [facility] on 27th of June 2012 and is looking forward to that.  Review of Systems    as per history of present illness Objective:   Physical Exam    Constitutional: Vital signs reviewed.  Patient is obese in no acute distress and cooperative with exam. Alert and oriented x3.  Head: Normocephalic and atraumatic Mouth: no erythema or exudates, MMM Eyes: PERRL, EOMI, conjunctivae normal, No scleral icterus.  Neck: Supple, Trachea midline normal ROM, No JVD, mass, thyromegaly, or carotid bruit present.  Cardiovascular: RRR, S1 normal, S2 normal, no MRG Pulmonary/Chest: CTAB, no wheezes, rales, or rhonchi Abdominal: Soft. Non-tender, non-distended, bowel sounds are normal, no masses, organomegaly, or guarding present.  GU: no CVA tenderness Musculoskeletal: Lumbar spinal tenderness along with left paraspinal muscle tenderness. Increased pain with bending forward and sideward on left side. SLR negative bilaterally.   No joint deformities, erythema, or stiffness, ROM full and no nontender Neurological: A&O x3, Strenght is normal and symmetric bilaterally, cranial nerve II-XII are grossly intact, no focal motor deficit, sensory intact to light touch bilaterally.  Skin: Warm, dry and intact. No rash, cyanosis, or clubbing.       Assessment & Plan:

## 2010-09-06 ENCOUNTER — Ambulatory Visit: Payer: Self-pay | Admitting: Internal Medicine

## 2010-09-11 NOTE — Discharge Summary (Signed)
April [name], April [name]                  ACCOUNT NO.:  [id number]   MEDICAL RECORD NO.:  [id number]          PATIENT TYPE:  INP   LOCATION:  5507                         FACILITY:  [facility]   PHYSICIAN:  Waldemar Dickens, MD     DATE OF BIRTH:  [birth year]   DATE OF ADMISSION:  09/28/2008  DATE OF DISCHARGE:  09/29/2008                               DISCHARGE SUMMARY   DISCHARGE DIAGNOSES:  1. Shortness of breath x1 week with O2 saturations in the 98-100%      range on room air, likely secondary to asthma with possible      component of obesity - hypoventilation syndrome, bronchitis or      gastroesophageal reflux disease induced laryngospasm.  2. Mild leukocytosis with a white blood cell count of 13 and bronchial      thickening on chest x-ray consistent with possible bronchitis,      treated with 7-day course of doxycycline upon discharge.  3. Morbid obesity.  4. History of gastroesophageal reflux disease.  5. History of multiple asthma exacerbations evaluated and seen in the      ED as well as the Outpatient Clinic.  The patient has never been      intubated.  6. History of depression.  7. History of tobacco abuse, no longer smoking.  8. History of Helicobacter pylori infection.  9. History of osteoarthritis.  10.Mild obstructive sleep apnea/hypopnea syndrome seen on December      2008, sleep study.   DISCHARGE MEDICATIONS:  1. Prednisone 10 mg tabs p.o., the patient to take 6 tabs on first 2      days, 5 tablets on next 2 days, 4 tablets on 2 few days, 3 tablets      on next 2 days, 2 tablets on next 2 days and 2 tablets on final 2      days, prescription provided.  2. Doxycycline 100 mg p.o. b.i.d. x7 days, prescription provided.   Please note that the following are the patient's chronic medications and  she was provided with a sample of the Ventolin inhaler as well as this  in the core inhaler upon discharge.  1. Symbicort 160 - 4.5 mcg inhaler 2 puffs b.i.d.  2. ProAir and  albuterol inhaler 1-2 puffs q.4-6 h. as needed.  3. Omeprazole 40 mg p.o. b.i.d.  4. Hydrochlorothiazide 25 mg p.o. daily.  5. Singular 10 mg p.o. daily.  6. Flonase nasal spray as needed.  7. Claritin 10 mg p.o. daily.  8. Tylenol p.o. as needed.  9. Advil p.o. as needed.   CONDITION ON DISCHARGE:  The patient's symptoms had improved  significantly with couple of doses of IV Solu-Medrol and repeat  nebulizer treatments.   On physical examination, she was not wheezing and she was sating in the  99-100% range.  She will follow up with Dr. Elizebeth Koller in the Outpatient  Clinic and has an appointment scheduled on October 14, 2008, at 2:30 p.m.  Please address the patient's chronic asthma as well as help the patient  to back to Dr. Shelle Iron of Pulmonology, who she  has seen previously.  It  has been thought that there may be a reflux component to her shortness  of breath and/or a obesity hypoventilation syndrome component.  For  these reasons, she may benefit from laparoscopic banding procedure in  the future to decrease her morbid obesity.   CONSULTATIONS:  None.   IMAGING:  A two-view chest x-ray done on September 28, 2008, that showed  bronchial thickening without consolidation or collapse.   HISTORY OF PRESENT ILLNESS:  The patient is a 40 year old obese African  American female with past medical history of asthma since 40 years of  age with no intubations, smoking, but not actively, several acute  exacerbations and numerous ED visits presents with worsening shortness  of breath over last week.  The patient was seen in the ED yesterday and  reports receiving no treatment.  The patient has been using albuterol  inhaler 6-8 times per day, Symbicort inhaler 3 times per day despite  being recommended only twice, Singulair, Claritin and has had multiple  steroid tapers over last few months.  The patient last had a prednisone  taper about 2 weeks ago.  The patient reports symptoms are  significantly  worse in the morning and then improved throughout day when she is at  work.  The patient denies any new allergens or animals.  She denies any  fever.  She denies any sick contacts.  She denies any other upper  respiratory infection symptoms.  She has mild chest tightness associated  with her shortness of breath.  She reports an apparent bug bite on her  lower right leg.   ADMISSION PHYSICAL EXAMINATION:  VITAL SIGNS:  Temperature 97.4, blood  pressure 154/95, pulse of 87, and O2 sat 99% on room air.  GENERAL:  Mildly labored breathing.  Obese.  HEAD:  Normocephalic and atraumatic.  EYES:  Vision grossly intact, pupils equal and round.  MOUTH:  No dental plaque and pharynx, pink and moist.  LUNGS:  Distant lung sounds, secondary to body habitus.  Possibly,  decreased air movement and wheezes, but difficult to auscultate.  HEART:  Distant heart sound, secondary by habitus.  Normal rate and  regular rhythm.  No murmurs, rubs, or gallops.  NEUROLOGIC:  Alert and oriented x3.  Cranial nerves II through XII  intact.  Strength normal in all extremities and gait normal.  SKIN:  Skin turgor normal and color normal.  Right lower leg has a  raised erythematous area where the patient believes she had a but bite.  No evidence of fluctuance.  Mildly warm to touch cervical nodes and no  anterior cervical adenopathy.  PSYCH:  Oriented x3.  Memory intact for recent and remote.  Appropriate.   ADMISSION LABORATORIES:  Sodium of 139, potassium 3.8, chloride of 103,  bicarbonate 29, glucose 77, BUN of 8, and creatinine of 0.73.  White  blood cell count of 13.3, hemoglobin 12.9, and platelets of 361.   HOSPITAL COURSE:  1. Shortness of breath, likely related to asthma exacerbation with      possible component of obesity - hypoventilation syndrome, GERD,      bronchitis:  The patient was admitted to the hospital for an asthma      exacerbation and had mildly labored breathing and mild  expiratory      wheezing on exam.  She was treated with couple of doses of IV Solu-      Medrol and albuterol and Atrovent nebs.  She improved significantly  and was asking to be discharged on the next day.  Upon physical      examination, she had no evidence of wheezing, she also was satting      in the 99-100% area on room air and was able to ambulate without      any dyspnea at all.  She later reported that she had ran out of her      albuterol and Symbicort and was not able to afford these meds.  For      this reason, she was provided with a Ventolin inhaler from the      sample supply at the Outpatient Clinic and was provided with the      Symbicort inhaler from the hospital.  She was also given a      prescription for prednisone taper as well as a 7-day course of      doxycycline for possible bronchitis as there was bronchial      thickening on the chest x-ray.  The most important thing for the      patient will be optimal outpatient followup and compliance with her      maximal therapy.  There has been a question of medication      compliance with the patient in the past and she has been evaluated      by Pulmonology and suspected a possible component of      gastroesophageal reflux disease.  With her significant morbid      obesity, she may benefit from future lap band to reduce her weight.  2. Hypertension:  She is continued on her HCTZ and a BMET was checked,      which showed a creatinine of 0.73 and a potassium of 3.8.  Her      blood pressure was a little bit high while hospitalized in the 130-      150 range.  She will continue on her home medications as an      outpatient.  3. Skin lesion:  The differential includes cellulitis versus bug bite      versus abscess versus contact dermatitis.  It does not appear to be      fluctuant or particularly warm.  For these reasons, infection is      much less likely and given the patient's suspecting a bug bite,      this is the  most likely explanation.  If it worsens, she is      instructed to return to the Outpatient Clinic for further      evaluation.  4. Chest tightness:  This is likely associated with asthma, one set of      cardiac enzymes was checked, which was negative and she did not      have any EKG changes.  5. Discharge vitals:  Temperature 97.8, pulse is 71, blood pressure      147/78, and O2 sat of 100% on room air.  6. Discharge labs:  Sodium 139, potassium 3.9. chloride of 105,      bicarbonate 25, glucose 168, BUN of 11, and creatinine 0.62.  White      blood cell count 13.9, hemoglobin 12.4, and platelet count of 348.  7. Pending labs:  PFTs are currently pending.      Linward Foster, MD  Electronically Signed      Waldemar Dickens, MD  Electronically Signed    LW/MEDQ  D:  09/29/2008  T:  09/29/2008  Job:  [id number]   cc:  Dr. Onalee Hua, Outpatient Clinic

## 2010-09-11 NOTE — Discharge Summary (Signed)
NAMEERMAL, BRZOZOWSKI                  ACCOUNT NO.:  [id number]   MEDICAL RECORD NO.:  [id number]          PATIENT TYPE:  OBV   LOCATION:  5013                         FACILITY:  [facility]   PHYSICIAN:  Acey Lav, MD  DATE OF BIRTH:  1971-03-10   DATE OF ADMISSION:  11/29/2008  DATE OF DISCHARGE:  11/30/2008                               DISCHARGE SUMMARY   DISCHARGE DIAGNOSES:  1. Asthma exacerbation.  2. Tooth pain.  3. Hypertension.  4. Gastroesophageal reflux disease.  5. Obesity.   DISCHARGE MEDICATIONS:  Note, there were no changes made to her  medication regimen as she left before any changes could be made.  Medication list is as follows.  1. Prevacid 30 mg one tablet twice a day.  2. Ventolin HFA 180 mcg - act 1-2 puffs every 4-6 hours as needed for      shortness of breath or wheezing.  3. Hydrochlorothiazide 25 mg 1 tablet daily.  4. Singulair 10 mg 1 tablet daily.  5. Flonase 50 mcg - act suspension 2 spray in each nostril daily for 3      days and then 1 spray in each nostril daily for 2 weeks.  6. Claritin 10 mg 1 tablet daily.  7. Tylenol 325 mg take 1-2 tablets by mouth up to every 6 hours as      needed for pain.  8. Advair Diskus 500 - 50 mcg one puff inhaled 2 times a day.  9. Prednisone 60 mg starting dose, decreased by 10 mg daily and then      stop.   DISPOSITION AND FOLLOWUP:  Ms. Moisan was discharged against medical  advice.  She was in fairly stable condition and felt that her symptoms  were much relieved after receiving IV steroids and nebulizers in the  emergency room.  She was advised to return to the emergency room if she  experienced increasing work of breathing or a cough.  She was advised to  follow up at the [facility] in the next several weeks  for reevaluation of her home asthma-controlled regimen.  She was given a  prescription for prednisone.   PROCEDURES:  Chest x-ray, August 4, shows peribronchial thickening  compatible with bronchitis and/or asthma, no consolidative process,  heart size is normal.   CONSULTATIONS:  None.   ADMITTING HISTORY AND PHYSICAL:  Ms. Rosal is a 40 year old female with  past medical history of asthma, GERD, depression who comes to the  emergency room for shortness of breath.  This is her third visit to the  emergency room within the past week with these symptoms.  She saw her  primary care physician 2 weeks ago and at that time her Symbicort was  changed to Advair.  One day prior to admission, the patient started  having a sore throat and dyspnea which was present at rest and with  exertion.  She has also had some cough productive of white sputum.  She  has had no fevers, chills, nausea, vomiting, or diarrhea.  She had been  using her albuterol inhaler  every 4 hours and has also used an albuterol  nebulizer 3 times on the day prior to admission.  She states that the  child in her home had a bad cold few weeks ago which she may have  caught.  Of note, Ms. Sawyer is also followed by a pulmonologist and she  will be seeing Dr. Maple Hudson at the end of this month.   HOSPITAL COURSE:  Ms. Cast was admitted late morning on August 4.  She  received IV steroids and several nebulizer treatments in the emergency  room and was placed on the standard asthma regimen, but on the evening  of the day of admission, Ms. Cassetta decided that she felt well enough to  go home.  At that time her breathing was adequate.  She still had some  wheezing this discharge was against medical advice as we would have  liked to keep her on IV steroids for another several doses at least  until she was breathing more comfortably; however, she had insisted on  discharge.  She was discharged with a prescription for prednisone taper  and recommendation to follow up in the [facility].  At  the time of discharge, repeat vitals had not been obtained.  Labs are  the same as on admission which are  white blood cells 12.3, hemoglobin of  11.3, hematocrit 34.0, platelets 313.  Sodium 134, potassium 4.2,  chloride 105, bicarb 26, BUN 9, creatinine 0.65, glucose 158, bilirubin  0.4, alk phos 61, AST 21, ALT 21, total protein 6.7, albumin 3.2,  calcium 8.5, magnesium 2.1.  Urine drug screen is negative.      Elby Showers, MD  Electronically Signed      Acey Lav, MD  Electronically Signed    CW/MEDQ  D:  12/05/2008  T:  12/06/2008  Job:  [id number]   cc:   [facility]  Rosanna Randy, MD

## 2010-09-11 NOTE — Letter (Signed)
May 15, 2010    Eloise Levels, M.D.  [address]  [place], [place] [address]   RE:  April, [name]  MRN:  [id number]  /  DOB:  07-Feb-1971   Reference:  Pulmonary clearance for surgery.   Dear Chales Abrahams:   I have been following [name] over the last year or so for asthma.  Her control is now better but she has had multiple hospital visits and  medical leaves from work over this period of time and should be regarded  as labile with real potential for an acute asthma exacerbation  complicating surgery.  Her most recent medical leave of absence was on  January 3 through May 07, 2010.  I think you need to take this into  consideration.  It would be probably best if her surgery could be done  where overnight stay and close observation is available with anesthesia  support for provision of bronchodilator therapy and at least the  possibility of reintubation.    Sincerely,      Clinton D. Maple Hudson, MD, Tonny Bollman, FACP  Electronically Signed    CDY/MedQ  DD: 05/15/2010  DT: 05/15/2010  Job #: [id number]

## 2010-09-11 NOTE — Procedures (Signed)
NAME:  April [name], April [name]                  ACCOUNT NO.:  [id number]   MEDICAL RECORD NO.:  [id number]          PATIENT TYPE:  OUT   LOCATION:  SLEEP CENTER                 FACILITY:  [facility]   PHYSICIAN:  Clinton D. Maple Hudson, MD, FCCP, FACPDATE OF BIRTH:  1970-07-[birth year]   DATE OF STUDY:  03/31/2007                            NOCTURNAL POLYSOMNOGRAM   REFERRING PHYSICIAN:  Hollace Hayward, M.D.   INDICATION FOR STUDY:  Hypersomnia with sleep apnea.  BMI 53, weight 330  pounds, height 66 inches.  Neck 16 inches.   EPWORTH SLEEPINESS SCORE:  10/24   MEDICATIONS:  Home medication listed and reviewed.   SLEEP ARCHITECTURE:  Total sleep time 237 minutes, sleep efficiency 78%.  Stage I was 17%, stage II 55%, stage III 5%, REM 21% of total sleep  time.  Sleep latency 1 minute, REM latency 57 minutes.  Awake after  sleep onset 23 minutes.  Arousal index 6.8.  No bedtime medication was  reported.   RESPIRATORY DATA:  Apnea/hypopnea index (AHI) 5.8 obstructive events per  hour indicating minimal obstructive sleep apnea/hypopnea syndrome.  This  included a total of 23 obstructive events, all hypopneas, and most  associated with REM.  Respiratory disturbance index (RDI) was 7.8.  There were insufficient events to trigger CPAP titration by split  protocol on the study night.   OXYGEN DATA:  Loud snoring with oxygen desaturation to a nadir of 89%.  Mean oxygen saturation through the study was 97.6% on room air.   CARDIAC DATA:  Sinus rhythm with occasional PVC.   MOVEMENT-PARASOMNIA:  No significant movement disturbance.  Bathroom x2.   IMPRESSIONS-RECOMMENDATIONS:  1. Very mild obstructive sleep apnea/hypopnea syndrome, AHI 5.8 per      hour, RDI 7.8 per hour.  Normal range 0-5 per hour.  Most events      occurred while in REM sleep.  Loud snoring with oxygen desaturation      to a nadir of 89%.  2. There were insufficient events on this study night to trigger CPAP      titration by split protocol.   Scores in this range usually are not      addressed with CPAP unless symptomatic impact of sleep apnea type      disorder is      compelling.  Suggest emphasis on weight loss, encouragement to      sleep off flat of back, and treatment for any significant nasal      obstruction if clinically appropriate.      Clinton D. Maple Hudson, MD, [facility], FACP  Diplomate, Biomedical engineer of Sleep Medicine  Electronically Signed     CDY/MEDQ  D:  04/05/2007 14:40:29  T:  04/05/2007 23:32:49  Job:  [id number]

## 2010-09-14 ENCOUNTER — Ambulatory Visit (INDEPENDENT_AMBULATORY_CARE_PROVIDER_SITE_OTHER): Payer: 59

## 2010-09-14 DIAGNOSIS — J45909 Unspecified asthma, uncomplicated: Secondary | ICD-10-CM

## 2010-09-14 NOTE — Discharge Summary (Signed)
April [name], April [name]                  ACCOUNT NO.:  [id number]   MEDICAL RECORD NO.:  [id number]          PATIENT TYPE:  OBV   LOCATION:  5504                         FACILITY:  [facility]   PHYSICIAN:  Lacretia Leigh. Ninetta Lights, M.D.DATE OF BIRTH:  1970/10/15   DATE OF ADMISSION:  05/22/2007  DATE OF DISCHARGE:  05/23/2007                               DISCHARGE SUMMARY   DISCHARGE DIAGNOSES:  1. Asthma exacerbation secondary to viral bronchitis.  2. Hypertension.  3. Osteoarthritis.  4. Morbid obesity.  5. Depression.  6. Gastroesophageal reflux disease.  7. Tobacco abuse.  8. History of Helicobacter pylori infection.  9. Insomnia.  10.Menorrhagia.  11.Obstructive sleep apnea.   DISCHARGE MEDICATIONS:  1. Symbicort 80/4.5 two puffs daily.  2. Albuterol 90 mcg inhaler every 4 hours as needed.  3. Hydrochlorothiazide 25 mg p.o. daily.  4. Prilosec 20 mg OTC 1 tablet daily.  5. Prednisone taper pack.  6. Azithromycin 250 mg p.o. daily for 7 days.   CONSULTATIONS:  None.   PROCEDURE PERFORMED:  Chest x-ray performed May 22, 2007, indicating  peribronchial thickening which is due to asthma, but no acute  infiltrate.   BRIEF HISTORY AND PHYSICAL:  This is a 40 year old female with history  of asthma with frequent exacerbations and followed up by [facility].  She presents with fever and increased shortness of breath,  dry cough, and wheezing since the morning of admission.  She was  recently seen by [facility] and was prescribed symbicort.  She  has not used her albuterol MDI for quite a while.  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  She does not have recent travels, sick contacts, or  pets.  She denies any sinus symptoms, chest pain, productive cough,  abdominal pain, nausea, vomiting, or diarrhea.   PHYSICAL EXAMINATION:  VITALS:  Temperature 101.3, blood pressure  124/78, pulse 99, respirations 22,  O2 sat 98% on room air, and weight  350 pounds.  GENERAL:  No acute distress.  The patient is obese.  She  was able to speak in full sentences.  Eyes:  Pupils are medium and  sluggish, but equally round and reactive.  Extraocular eye movement  intact and anicteric.  Oropharynx:  There is slight erythema in the  posterior pharynx, but no exudates, lesions, or postnasal drip.  NECK:  Supple.  No lymphadenopathy.  No thyromegaly.  LUNGS:  Good air movement, but there are bilateral expiratory wheezes  diffusely with a prolonged expiratory phase, but no rhonchi.  CARDIAC:  Distant heart sounds.  S1 and S2 are normal.  Regular rate and  rhythm.  No abnormal sounds.  ABDOMEN:  Protuberant.  Positive bowel sounds, soft, nontender,  nondistended.  EXTREMITIES:  +1 nonpitting edema, symmetric in location and no  tenderness or Homans sign found.  GU:  No CVA.  SKIN:  No rashes.  NEUROLOGIC:  Alert and oriented x3.  Cranial nerves II through XII  intact.  Muscle strength +5 in all extremities.  Sensation to light  touch intact.  PSYCHIATRY:  Appropriate.   ADMITTING LABORATORY:  Sodium 138, potassium 3.6, chloride 106, bicarb  22, BUN 4, creatinine 1.0, and glucose 100.  Hemoglobin 12.2, white  count 8.8, and platelets 378. ANC 5.2, MCV 80.8, RDW 16.3.   HOSPITAL COURSE:  1. Shortness of breath and productive cough.  The patient had a chest      x-ray that was negative for obvious signs of pneumonia.  Her      symptoms were consistent with viral bronchitis.  She had an asthma      exacerbation secondary to this.  We treated the patient with      frequent albuterol nebulizers and supportive care.  Her breathing      significantly improved within 24 hours.  Her shortness of breath      and her wheezing were both less prominent the day after admission.      We were able to discharge the patient with albuterol MDI and      Symbicort.  She will follow up with the outpatient clinic in      addition  to Dr. Corinda Gubler, Pulmonology in regard to her asthma.  2. Hypertension.  The patient's blood pressure was well controlled      during her hospitalization.  We continued her hydrochlorothiazide.      She will continue on this medication upon discharge.  3. Obstructive sleep apnea.  The patient has mild case of this      condition.  She does not have to use CPAP at home.  We will      continue to monitor in the outpatient clinic in regard to this      problem.   FOLLOW UP:  The patient will follow up with [facility] on  May 25, 2007 and the [facility] in 1-2 weeks and she will make that  appointment.   DISCHARGE VITALS:  Temperature 99.1, blood pressure 150/67, respiratory  rate 20, pulse 86, and sating 100% on room air.   DISCHARGE LABORATORY:  Sodium 137, potassium 4.0, chloride 108, bicarb  22, BUN 5, and creatinine 0.76.  Hemoglobin 11.9, white count 9.1, and  platelet count 365.      Lollie Sails, MD  Electronically Signed      Lacretia Leigh. Ninetta Lights, M.D.  Electronically Signed    CB/MEDQ  D:  07/22/2007  T:  07/22/2007  Job:  [id number]

## 2010-09-14 NOTE — Discharge Summary (Signed)
[place]  Patient:    April [name], April [name]                           MRN: [id number] Adm. Date:  [id number] Disc. Date: [id number] Attending:  Levy Sjogren Dictator:   Doreatha Martin, M.D.                           Discharge Summary  The patient was admitted to medical teaching service B.  Attending: Dr. Zachery Dauer.  Resident: Dr. Manson Passey.  Intern:  Dr. Alwyn Ren.  DISCHARGE DIAGNOSES: 1. Asthma exacerbation. 2. Borderline hypertension. 3. Obesity.  DISCHARGE MEDICATIONS: 1. Flovent 110 mcg 1 puff b.i.d. 2. Albuterol 1 to 2 puffs q.4-h. p.r.n. 3. Claritin 10 mg p.o. q.d. 4. Prednisone 40 mg p.o. q.d. x 5 days. 5. Doxycycline 100 mg p.o. b.i.d. x 4 days.  HISTORY OF PRESENT ILLNESS:  April [name] is a 40 year old African-American female who presented to the [facility] Emergency Department with complaints of increasing shortness of breath over the past five to seven days not relieved by [employer].  She states that she has always used Primatene Mist and has not been using any other medications for her asthma.  The patient states that, in particular over the 24 hours prior to admission, she experienced severe shortness of breath with wheezing and congestion with a cough productive of yellow sputum.  She also complained of chest wall pain associated with her cough.  The patient denies any fever, chills, nausea, vomiting, or diarrhea.  PAST MEDICAL HISTORY: 1. Asthma. 2. Borderline hypertension. 3. History of hemorrhoids.  ALLERGIES:  No known drug allergies.  MEDICATIONS:  The patient only takes over-the-counter Primatene Mist.  PHYSICAL EXAMINATION ON ADMISSION:  VITAL SIGNS:  Temperature 98.5, pulse 80, respiratory rate 18, blood pressure 147/88, 98% oxygen saturation on 2 liters nasal cannula.  GENERAL:  April [name] is a morbidly obese African-American female who was in mild distress at the time of my interview.  HEENT:  PERRLA.   No oropharyngeal lesions.  NECK:  No lymphadenopathy, no masses.  LUNGS:  There is readily audible wheezing throughout all lung fields, no crackles.  CARDIOVASCULAR:  Regular rate and rhythm, S1, S2.  No murmurs, rubs, or gallops.  ABDOMEN:  Soft, obese, nontender, nondistended.  Active bowel sounds.  EXTREMITIES:  No edema.  Pedal pulses 2+ bilaterally.  NEUROLOGIC:  Cranial nerves II-XII grossly intact.  No sensory deficits.  PERTINENT LABORATORY DATA ON ADMISSION:  ABG showed a pH of 7.41, PCO2  34, PO2 74, bicarb 23.  Sodium 139, potassium 3.2, chloride 107, bicarb 25, BUN 6, creatinine 0.7, glucose 90.  White blood cells 10.5, hemoglobin 14.6, platelets 398, ANC 8.2.  Chest x-ray on admission showed mild peripharyngeal thickening and slightly increased interstitial markings consistent with reactive airway disease versus bronchitis.  No focal infiltrates or effusions.  HOSPITAL COURSE:  April [name] was admitted to the medical teaching service B and was started on albuterol and Atrovent nebulizer treatments every 3 hours initially.  She was also started on IV steroids.  She was given Solu-Medrol 125 mg q.6h. for the first two days.  April [name] responded very well to this therapy, and by the third hospital day, her wheezing had greatly diminished, and her oxygen saturation was 98% without supplemental oxygen.  We also did an ambulatory trial without oxygen, and her oxygen saturations remained between  98 and 100%.  The patient had a slight hypokalemia upon admission of 3.2 which was corrected with Joyce Gross [name] supplementation, and the following day, her potassium was within normal limits.  The patient was also hydrated with one-half normal saline at a rate of 80 cc per hour during this hospitalization.  Upon discharge, her white blood cell count was elevated at 22.5.  This was likely secondary to the course of steroids.  Her potassium upon discharge was 3.8 which is within normal  limits.  Her oxygen saturations, as mentioned above, were 98 to 100% on room air even with activity, and her peak flows pretreatment were 200 and post treatment 250.  April [name] states that 250 is her personal best historically.  April [name] explains to me that she smokes four to five cigarettes every single day and that her boyfriend is also a smoker who lives with her.  I have explained to her that it is very important for her to quit smoking given that she has such severe and uncontrolled asthma.  She has expressed an understanding of this information.  Triggers of her asthma are grass and certain perfumes.  April [name] is to follow up with me in clinic seven days from today.  I have obtained her phone number which is [phone] in order to inform her of the time and date of her appointment with me at the internal medicine clinic.  k DD:  12/31/99 TD:  01/01/00 Job: [id number]

## 2010-09-14 NOTE — Consult Note (Signed)
April [name], April [name]                  ACCOUNT NO.:  [id number]   MEDICAL RECORD NO.:  [id number]          PATIENT TYPE:  INP   LOCATION:  4740                         FACILITY:  [facility]   PHYSICIAN:  Vida Roller, M.D.   DATE OF BIRTH:  February 06, 1971   DATE OF CONSULTATION:  04/30/2004  DATE OF DISCHARGE:                                   CONSULTATION   PRIMARY CARE PHYSICIAN:  Dr. Darrick Huntsman on the medical teaching service B.  Her  primary is unassigned.  She has been seen in the [facility] on  one occasion in April of this year and has been seen multiple times in the  emergency department, both at [facility] and [facility].   HISTORY OF PRESENT ILLNESS:  [name] is a 40 year old African American  female with a history of severe asthma, hypertension who came to the  emergency room on April 29, 2004 complaining of shortness of breath.  She  states that she ran out of her asthma medications including her Advair and  Albuterol inhalers and had progressive shortness of breath over the course  of two days with a dry cough and on again, off again discomfort in her  chest.  We were asked to see her regarding this discomfort in her chest  which she describes as mostly a pleuritic-type of discomfort although not  classically so, it does worsen when she has an exacerbation of her shortness  of breath and her asthma attack.  She is currently without discomfort  currently and has significantly improved since the treatment of her asthma  was started after her admission.  She is on the following medications:  1.  Solu-Medrol 125 mg IV q.6h.  2.  Atrovent nebulizers q.6h.  3.  Ventolin nebulizer q.6h.  4.  Hydrochlorothiazide 25 mg a day.  5.  Protonix 40 mg a day.  6.  Avalox 400 mg a day.   PAST MEDICAL HISTORY:  Significant for asthma.  She has had it since she was  22.  She had pulmonary function studies two or three years ago but the  results are not available.  She has hypertension  which is poorly described.  She denies any hyperlipidemia.  She is morbidly obese.   PAST SURGICAL HISTORY:  She has had four vaginal deliveries without  complications.  No other surgeries are noted.   SOCIAL HISTORY:  She lives in [place] by herself.  She has four children.  She has about a 15 pack year smoking history and still smokes 1-2 cigarettes  a day.  Denies any alcohol or drug abuse however, her urine drug screen is  positive for cocaine which she adamantly denies using.   FAMILY HISTORY:  Her mother has hypertension.  Father has unknown.  She has  a brother and a sister who have hypertension and her four children, one of  whom has hypertension, one of whom has asthma.  There is no coronary artery  disease in her family.   REVIEW OF SYSTEMS:  Generally negative except for that reviewed in the  history  of present illness.   PHYSICAL EXAMINATION:  GENERAL:  She is a morbidly obese, African American  female in no apparent distress who was alert and oriented x4.  HEENT:  Examination of the head, ears, eyes, nose and throat is  unremarkable.  NECK:  Supple.  There is no jugulovenous distention or carotid bruits  although her neck is quite thick.  There is no obvious lymphadenopathy.  CHEST:  Exam reveals diffuse wheezes throughout with poor air movement.  She  has a regular rate with normal first and second heart sounds.  HEART:  Her heart is relatively distant but there is no obvious murmur.  ABDOMEN:  Obese but soft and nontender.  GENITOURINARY/RECTAL:  Exam were deferred.  EXTREMITIES:  Without significant edema, 2+ pulses throughout.  There is no  skin lesions consistent with IV drug abuse or endocarditis.  NEUROLOGICAL:  Exam is nonfocal.  BREASTS:  Her breast exam was deferred.   Her chest x-ray shows evidence of reactive airway disease but is otherwise  normal.  Her electrocardiogram shows sinus rhythm at a rate of 80 with a  normal axis, normal intervals.  There  is some nonspecific ST T wave changes  throughout both her inferior leads and her anterolateral leads.  These  appear to be unchanged from September of 2002 and maybe slightly more  prominent.   LABORATORIES:  Her white blood cell count is 12.6, hemoglobin and hematocrit  of 13 and 39 with a platelet count of 411,000.  Sodium 136, potassium 3.5,  chloride 105, bicarbonate 23, BUN 10, creatinine 0.8 and her blood sugar is  209.  Her urine drug screen is positive only for cocaine.  She has three  sets of cardiac enzymes.  The first shows a CK of 724 with an MB of 7.3  which gives an index of just over 1 with a troponin of 0.01.  Next set: 695  with an MB of 8.4.  Once again, index is less than 3 with troponin of less  than 0.01 and the final CK of 567 with an MB of 8.1 and a troponin of less  than 0.01.   1.  So we have a lady who has chest discomfort which is very atypical for      coronary disease.  She does have traditional cardiac risk factors and      also has a positive urine drug screen for cocaine.  Her EKG shows      nonspecific changes.  Her enzymes are not consistent with acute      myocardial infarction and she is currently pain free.  Therefore, I      think she is at reasonably low risk for coronary disease although she      could certainly have cocaine-induced chest discomfort.  She does have a      positive urine drug screen.  2.  She has a positive urine drug screen for cocaine although she denies      this.  I would still probably avoid beta blockers in this woman and I      think there needs to be probably further evaluation to see if indeed she      does have a habitual cocaine use.  If indeed this is so, this will      change the approach to the therapy for potential coronary artery disease      and chest discomfort.  3.  Reactive airway disease.  She is still wheezing quite  impressively.  I     think this is very likely to be the cause of her chest pain.  She is       currently on antibiotics and steroids as well as nebulizers and tells me      that she is getting significantly better from when she was evaluated.      She also has significant obesity.  She appears to have new onset      diabetes mellitus although this could likely also be from the steroids      that are being used.   With regard to her potential for coronary artery disease, at this point I  think it is reasonable to focus therapy on reactive airway disease and to  address this as aggressively as possible.  It certainly would not be  unreasonable to evaluate her with a Dobutamine Myoview.  I think it would be  very reasonable to check a urine pregnancy test before doing so as this will  be a significant exposure to radiation.  I probably would add a low dose of  aspirin 81 mg once a day and there does not appear to be any allergy to this  and no history of peptic ulcer disease.  I do not think  she needs to be anticoagulated currently and she needs substance abuse  counseling.  The Dobutamine Myoview can likely be done once her reactive  airway disease is significantly more stable.  We will continue to follow  her.  Thank you very much for this opportunity to care for this woman.      Trey Paula   JH/MEDQ  D:  04/30/2004  T:  04/30/2004  Job:  [phone]

## 2010-09-14 NOTE — Discharge Summary (Signed)
[place]. [facility]  Patient:    April [name], April [name]                           MRN: [id number] Adm. Date:  [id number] Attending:  Farley Ly Dictator:   Camillia Herter, MS4                           Discharge Summary  DIAGNOSES: 1. Asthma exacerbation secondary to allergen exposure. 2. Borderline hypertension, uncontrolled. 3. Obesity.  DISCHARGE MEDICATIONS: 1. QVAR 80 mcg, 4 puffs b.i.d. 2. Atrovent 2 puffs b.i.d. 3. Albuterol 2 puffs q.4h. p.r.n. shortness of breath, wheezing. 4. Prednisone 60 mg q.d. x 3 days, then 50 mg q.d. x 3 days, then 40,    then 30, then 20, then 10.  PROCEDURES: Two-way chest x-ray on November 27: Peribronchial thickening slightly more than on previous exam without focal infiltrate or atelectasis.  CHIEF COMPLAINT:  Asthma.  HISTORY OF PRESENT ILLNESS:  A 40 year old African-American female with asthma x 7 years presents with increased shortness of breath, wheezing, and production of clear phlegm x 2 days.  She reports right upper chest pain with deep inspiration, onset since walked on wet grass two days ago.  In ER for similar episode one week ago, responded at that time to nebulizers and discharged with prednisone, albuterol, Flovent.  On this visit to ER, she was feeling improved after 90 minutes continuous nebulizer and 16 mg of prednisone, but wheezing persists.  The patient denies fever and chills but feels "hot."  PAST MEDICAL HISTORY:  Asthma, hypertension.  FAMILY HISTORY:  Brother with bronchitis, hypertension in parents.  SOCIAL HISTORY:  Four cigarettes per day to one pack per day x 13 years.  No alcohol x 1 year.  No illicit drug use.  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  REVIEW OF SYSTEMS:  No fever, chills, or weight loss.  Shortness of breath/wheezing as per HPI, otherwise negative in all systems.  PHYSICAL EXAMINATION:  VITAL SIGNS:  Temperature 98, blood  pressure 127/58, pulse 78, respiratory rate 22, O2 saturation 98% on room air.  GENERAL:  Obese African-American female, interactive, speaking sentences, no acute distress.  HEENT:  Mucous membranes moist.  Oropharynx clear without exudate.  CARDIOVASCULAR:  Regular rate and rhythm without murmurs, rubs, or gallops. No JVD.  RESPIRATORY:  Scattered wheezes throughout, moving air, using some accessory muscles, but speaking in complete sentences.  GI:  Plus bowel sounds, soft, nontender.  No hepatosplenomegaly, no masses. Obese.  EXTREMITIES:  No edema.  NEUROLOGIC:  Alert and oriented x 3, nonfocal.  ADMISSION LABORATORY DATA:  WBC 17.7, hemoglobin 12.3, hematocrit 34.5, platelets 403, ANC 92%.  ABGs 7.48/24.8/68/19.  Complete metabolic panel significant only for glucose of 130.  LFTs within normal limits.  HOSPITAL COURSE:  The patient was hypoxic as indicated by admitting labs.  She remained afebrile with clear phlegm production.  High white blood count thought to be secondary to long steroid use before admission.  The patient was started on IV Solu-Medrol 80 mg q.8h. and given albuterol nebulizers 3 ml q.2h. in lieu of outpatient inhalers.  Azithromycin was begun on day #2 to cover for possible infectious contribution.  The patient reported feeling better, and wheezing was less audible; although later in the course, the transiently became more audible again.  Peak flows initially improved with nebulizers but  eventually plateaued around day #3 at approximately 300 pre and post nebulizer.  At that time, the patient stated that she felt better than baseline and wished to go home.  The patient was taken off nebulizers and IV steroids and placed on albuterol and fluticasone metered dose inhalers as well as prednisone 60 mg b.i.d. to wean her to home dosing and monitor for potential relapse.  On day of discharge, her peak flow was 285 which was greater than 70% of expected,  and wheezes were absent on the right and greatly decreased on the left.  DISCHARGE LABORATORY DATA:  From November 27: ABG 7.487/24.6/68.  DISCHARGE INFORMATION:  The patient is discharged on medications described under Discharge Medications.  She was sent home with samples of QVAR and had other medications at home.  Social work discussed patients inability to qualify for financial assistance but planned on contacting her post discharge about the possibility of her qualifying for Medicaid since she has four children.  The patient has received some instruction regarding nutrition and smoking cessation while inpatient; she has outpatient followup for this arranged.  She is to return to the internal medicine clinic at [facility] on December 5 at 2 p.m.  She was told she may return to work on the following day.  PHYSICIANS:  Camillia Herter, MS4, acting intern.  Resident: Tawni Millers, M.D. DD:  03/27/00 TD:  03/27/00 Job: [id number]

## 2010-09-14 NOTE — Discharge Summary (Signed)
April [name], [name]                  ACCOUNT NO.:  [id number]   MEDICAL RECORD NO.:  [id number]          PATIENT TYPE:  OBV   LOCATION:  3733                         FACILITY:  [facility]   PHYSICIAN:  Fransisco Hertz, M.D.  DATE OF BIRTH:  11-06-1970   DATE OF ADMISSION:  12/31/2007  DATE OF DISCHARGE:  12/31/2007                               DISCHARGE SUMMARY   DISCHARGE DIAGNOSES:  1. Asthma exacerbation.  2. Sleep apnea.  3. Gastroesophageal reflux disease.  4. Hypertension.  5. Obesity.   DISCHARGE MEDICATIONS:  1. Prednisone taper pack starting at 60 mg oral decreasing by 10 mg      per day and then stop.  2. Omeprazole 40 mg by mouth twice daily.  3. Symbicort 2 puffs daily.  4. ProAir (albuterol) 1-2 puffs every 4-6 hours as needed for      shortness of breath.   DISPOSITION AND FOLLOWUP:  April [name] left against medical advice several  hours after being admitted.  Before she was permited to leave, it was  confirmed that her oxygen saturation was appropriate.  She was strongly  advised to remain in hospital as she had just received a large dose of  IV steriods and several breathing treatments, and her symptoms my have  only been temporarily relieved.  She insisted on leaving.  She was  encouraged to make an appointment at [facility] within  several days of discharge to discuss improving her long term asthma  management.   PROCEDURES:  Chest x-ray on December 31, 2007, shows peribronchial  cuffing.  There are no infiltrates or effusions.  Heart size is normal.  Osseous structures are normal.  Impression is of chronic bronchitis.   ADMITTING HISTORY AND PHYSICAL:  April [name] is a 40 year old female with  past medical history of asthma and obstructive sleep apnea and obesity,  who presents to the emergency department with shortness of breath, which  started 2 days prior to admission.  She initially had a sore throat as  well as increasing shortness of breath.   She reports using her Symbicort  as instructed and needing her ProAir inhaler more frequently.  On the  day prior to admission, her shortness of breath became acutely worse and  she went to the emergency department.  At that time, she was given a  prednisone taper pack, and she felt better, and returned home.  However,  when she returned home, she found that her wheezing increased and she  was unable to sleep.  She again reported to the emergency department for  further treatment.  At this time, she was given IV dexamethasone 10 mg  as well as albuterol and Atrovent neb treatments and was admitted to the  floor.   On admission, sodium 139, potassium 3.9, chloride 108, bicarb 23, BUN 7,  creatinine 0.5, and glucose 153.  White blood cells 10.5, hemoglobin  11.6, and platelets 374.   PHYSICAL EXAMINATION:  VITAL SIGNS:  Oxygen saturation 100% on room air.  Temperature was 97 degrees, blood pressure 163/87, pulse 70, and  respirations  23.  GENERAL:  The patient was in mild distress.  LUNGS:  Significant for diffuse wheezes.  There were no crackles.  There  was no respiratory distress.  CARDIOVASCULAR:  Regular rate and rhythm.  No murmurs, rubs, or gallops.  EXTREMITIES:  There is no peripheral edema.  BACK:  No CVA tenderness.  NEUROLOGIC:  Nonfocal.   HOSPITAL COURSE BY PROBLEM:  1. Asthma exacerbation.  April [name] was treated in the emergency      department with IV dexamethasone as well as albuterol and Atrovent      nebulizer treatments.  Once admitted to the floor, she decided to      leave against medical advice and did not receive further treatment.      She was advised to make an appointment at the [facility] to discuss improving her asthma management plan.   Labs and vitals as mentioned previously apply to discharge.      Elby Showers, MD  Electronically Signed      Fransisco Hertz, M.D.  Electronically Signed    CW/MEDQ  D:   02/17/2008  T:  02/18/2008  Job:  [id number]

## 2010-09-14 NOTE — Discharge Summary (Signed)
April April [name], April [name]                  ACCOUNT NO.:  [id number]   MEDICAL RECORD NO.:  [id number]          April [name] TYPE:  INP   LOCATION:  4740                         FACILITY:  [facility]   PHYSICIAN:  Duncan Dull, M.D.     DATE OF BIRTH:  19-Nov-1970   DATE OF ADMISSION:  04/29/2004  DATE OF DISCHARGE:  05/03/2004                                 DISCHARGE SUMMARY   RESIDENT PHYSICIAN:  Dr. Manning Charity.   INTERN PHYSICIAN:  Dr. Reggie Pile.   DISCHARGE DIAGNOSES:  1.  Bronchial asthma with recurrent exacerbation.  2.  Abnormal EKG with T wave inversion in V3 and V4 on admission with T wave      inversion in V3 on May 02, 2004.  3.  Leukocytosis.  4.  Hypertension.  5.  Obesity.  6.  Tobacco abuse.  7.  Positive urine drug screen for cocaine.   DISCHARGE MEDICATIONS:  1.  Doxycycline 100 mg 1 p.o. b.i.d. x10 days.  2.  Hydrochlorothiazide 25 mg 1 p.o. daily.  3.  Robitussin p.r.n.  4.  Advair 250/50 1 puff b.i.d.  5.  Atrovent MDI2puffs q.i.d.  6.  Albuterol 2 puffs q.4 hours p.r.n.  7.  Singulair 10 mg 1 p.o. q.h.s.  8.  Prednisone taper.   DISPOSITION AND FOLLOWUP:  April April [name] will follow up at [facility] on May 08, 2004.  At that time April patients  oxygenation status as well as wheezing should be ascertained.  Of note April  April [name] had an abnormal EKG and Cardiology was consulted and felt like she  could benefit from an outpatient stress test which depending on April patients  respiratory status, may be scheduled for outpatient followup at that time.  Her peak flow should also be assessed and note that April April [name] had peak  flows of 220 pretreatment and 310 posttreatment on April day of discharge.  April April [name] also had significant difficulties with April cost of her  medications and should be set up with financial assistance and assistance  with medications.  April April [name] is also apparently living with friends and  does not currently have her own  residence and has been given information for  public housing.  April April [name] also was given samples of Singulair at  discharge, but only enough for 20 days and will either need samples or  assistance through April clinic.   PROCEDURE PERFORMED:  Chest x-ray April 29, 2004 showing airway thickening  consistent with reactive airway disease.   CONSULTATIONS:  Cardiology.   BRIEF ADMISSION HISTORY AND PHYSICAL WITH ADMISSION LABS:  April April [name] is a  40 year old black female with past medical history significant as above who  comes into April emergency room after 32 prior visits in April ED with  increasing shortness of breath since April day prior to admission.  April  April [name] reports that she ran out of her Advair because she cannot pay for it  and has been getting samples from April clinic 3-5 days prior to admission.  She also ran out of her albuterol on  April day prior to admission.  Ever since  she had run out of her Advair she has been using her albuterol more.  She  reports a dry cough, nonproductive, no fevers or chills, no nausea, vomiting  or diarrhea.  She has also complained of chest pain off and on since  shortness of breath has been present.  She does have increasing chest pain  with taking a deep breath.  She describes April pain as central and  substernal.  Of note April peak flows at home are usually around 350, but now  have only been 50-75.  On exam her temperature was 98.3, pulse 90, blood  pressure 174/52, respiratory rate 24, O2 saturation 100% on 5 liters.  GENERAL:  She is an obese black female awake and alert and oriented and in  mild respiratory distress.  HEENT:  Shows pupils equal, round, reactive to light and accommodation.  Extraocular movements intact.  Her oropharynx is clear without exudate.  NECK:  Supple, no thyromegaly, no JVD.  RESPIRATORY:  Exam shows bilateral wheezes with decreased air entry at base.  CARDIAC:  Regular rate and rhythm, without murmurs, rubs or  gallops.  ABDOMEN:  Soft, positive bowel sounds, nontender, nondistended.  EXTREMITIES:  Show no cyanosis, clubbing or edema.  SKIN:  Without rash.  NEUROLOGIC:  Grossly intact.   April patients labs showed a white count elevated 11.2, hemoglobin 12.8,  platelets 371, BMET shows a sodium 135, potassium 3.5, chloride 106, CO2 24,  BUN 11, creatinine 0.8, glucose 111.  Chest x-ray as above.   HOSPITAL COURSE:  By active problem:  1.  Asthma exacerbation.  I felt like this was an asthma exacerbation      secondary to medication noncompliance.  She was significantly wheezing      and had peak flows that were decreased as above.  She was put on IV      steroids and albuterol and Atrovent nebs.  Her peak flows slowly      improved over time, but she was still requiring significant treatments,      her steroids were titrated to down p.o. and she was restarted on Advair      and was given April hospital Advair for home use.  She was transferred      over to MDIs without difficulty and had peak flows ranging between 200-      300.  On April day of discharge she had peak flows of 310 after albuterol      and Atrovent treatment.  During April end of her hospital stay she was      started on Singulair since she has had so many significant problems with      her asthma and since she has had to report to April emergency room at      least 15 times since this summer to get nebulizer treatment.  This is      going to be a difficult situation with her, with her financial issues      and this will need to be followed up as an outpatient.  She was given      samples out of April $50 dollar fund to fill about 20 Singulair and was      given her albuterol, Atrovent and Advair for home use.  She was treated      empirically with antibiotics and felt that she would benefit from a full      course of Avelox, however  with cost, she was discharged home on     Doxycycline 100 mg to take for another full 10-day course.  She  was also      sent home with a prednisone taper starting at 60 mg to be tapered down      to 0 over April next two weeks.  She was able to ambulate and keep her O2      saturations at greater than 90% without oxygen.  2.  Abnormal EKG with complaints of chest pain.  April April [name] had T wave      inversions in V3 and V4 which appeared to be new from prior, she also      had a positive UDS for cocaine.  Dr. Darrick Huntsman felt that it would be      appropriate to consult cardiology and they came and saw April April [name] and      felt that she would probably benefit from some risk stratification and      actually scheduled her for an exercise Cardiolite.  However April patients      respiratory status and her body habitus prohibited her from doing any      sort of significant exercise and so it was felt that this could be done      as an outpatient in April future.  With her history of cocaine, which April      April [name] denies, and her risk factors which include smoking,      hypertension, and body habitus, it was felt that she should have cardiac      workup in April future.  Her cardiac enzymes were negative x3.  Further      EKG showed a normalization of April T wave inversion in V4.  3.  Abnormal urine drug screen.  April April [name] had a urine drug screen that      was positive for cocaine only.  April April [name] denied April use of cocaine,      but did report that her boyfriend uses it and that is possibly how it      got into her system.  Case management came and saw April April [name] and      discussed cessation options, but April April [name] continued to deny use.  4.  Hypertension.  April April [name] was hypertensive throughout her admission      with blood pressures ranging from 129 up to 170 systolic.  She was      started on hydrochlorothiazide 25 mg p.o. daily.  This will need to be      followed up as an outpatient __________ it is probably a better idea for      her to not be placed on a beta-blocker with her positive UDS and with       her asthma trouble.   DISCHARGE LAB RESULTS:  On day April of discharge April April [name] had a CBC with a  white count of 21.5, hemoglobin 14.1, hematocrit 43.2, and platelets 456.  Note that her elevated white blood cell count is most likely secondary to  steroid use.  Her BMET showed a sodium of 135, potassium 3.7, chloride 99,  CO2 29, BUN 14, creatinine 0.9 and glucose 117.  She had a fasting lipid  profile checked which showed a total cholesterol of 170, triglycerides 48,  HDL 57, LDL 103.  A TSH was 0.541, BNP was 40.4, cardiac enzymes negative  x3, urine drug screen positive for cocaine.       WW/MEDQ  D:  05/08/2004  T:  05/08/2004  Job:  [id number]   cc:   Vida Roller, M.D.  Fax: [phone]

## 2010-09-14 NOTE — Discharge Summary (Signed)
[place]  Patient:    April [name], April [name] Visit Number: [id number] MRN: [id number]          Service Type: MED Location: 3000 [id number] Attending Physician:  Phifer, Trinna Post Dictated by:   Dr. Hanley Hays Admit Date:  10/05/2001 Discharge Date: 10/07/2001   CC:         Marisue Brooklyn, M.D.   Discharge Summary  ADMISSION DIAGNOSES: 1. Acute exacerbation of asthma. 2. Upper respiratory tract infection. 3. Hypertension. 4. Leukocytosis. 5. Obesity.  DISCHARGE MEDICATIONS: 1. Prednisolone taper 60 mg q.d. for two days, then 40 mg q.d. for three days,    then 20 mg q.d. for three days. 2. Azithromycin 250 mg q.d. for seven days. 3. Combivair two puffs b.i.d.  DISPOSITION:  The patient is discharged in good health.  FOLLOW-UP:  She is to follow up with Dr. Hanley Hays at [facility] a week from discharge.  The patient will be called with appointment date and time later today before she leaves the hospital.  During that visit, the patients peak flows will be measured in the clinic.  She will be examined fully to see if her asthma has resolved.  Also her temperature will be checked as well as absolute CBC to see if her white count has dropped.  CONSULTING PHYSICIANS:  None.  PROCEDURE:  None.  BRIEF HISTORY:  April [name] is an 40 year old African-American female with history of asthma for over 10 years and hypertension which was untreated.  She presented on the night of October 04, 2001, with severe shortness of breath that started that day which was unresponsive to her Advair that she takes at home. The patients symptoms started two days prior to admission with some sore throat, progressive cough, chest tightness, and shortness of breath which feels like her asthma attacks.  She uses her Advair repeatedly at home, but there was no relief.  Her symptoms were associated with fever which was subjective and cough.  The patient had family over,  but no one else was sick. The patient has had multiple previous admissions as well as ER visits, but she has never had intubation.  Her last admission was in 2001.  On examination, she was found to have a temperature of 99.9, blood pressure 151/117.  A recheck later showed 130/80 while in the ER.  PHYSICAL EXAMINATION:  VITAL SIGNS:  Pulse was 712, respirations was 28, oxygen saturations 70% on room air.  GENERAL: Obese, African-American female in mild distress.  Mild hyperemic throat.  NECK:  Supple, no JVD. CHEST: Widespread expiratory wheezes bilaterally with labored breathing. HEART: She was tachycardic with regular rhythm.  ABDOMEN: Obese, nontender, and benign.  LABORATORY DATA:  White count 17.8 with an ANC of 16.5, hemoglobin 12.6, platelets 373.  Potassium 3.6, sodium 136, chloride 107, CO2 23, BUN 6, creatinine 0.6, glucose 118, calcium 8.2.  Her liver panel was essentially normal except for albumin of 3.  ABG; pH 7.45, pCO2 36, pO2 84, and saturation 97% on 2 liters.  Chest x-ray shows no acute disease.  HOSPITAL COURSE:  #1 - Acute exacerbation of asthma.  This was unresponsive to her Advair at home and was thought to be likely secondary to upper respiratory tract infection as explained by the patient since her symptoms started with sore throat followed by acute exacerbation of asthma as well as fever.  The patient was therefore admitted and placed on nebulizers, albuterol and Atrovent, on IV steroids, Decadron.  A sputum culture was also checked which was negative. Her peak flows were also followed while in the hospital and was initially 125 and rose to 150 after bronchodilators.  At the time of discharge, however, the patient was close to her baseline to 300 to 350 with peak flows ranging between 230 before nebulizer treatment to 280 after nebulizer treatment.  Her wheezing had also gone down and she was started on steroid taper as well as Advair at home.  #2 -  Leukocytosis.  The patient had leukocytosis in the hospital secondary to the use of steroids as well as upper respiratory tract infection.  Her white count trended down prior to discharge initially went to 21,500 and will check her white count as an outpatient to see what trend it takes.  #3 - Hypertension.  The patient reported having mild hypertension, but she has never had any treatment for her hypertension.  Therefore, during this hospitalization, her blood pressure looked normal, so no treatment was instituted.  #4 - Obesity.  The patient is morbidly obese and she is aware of that. She has been counseled on the health effects of obesity and the patient is aware.  DISCHARGE LABORATORY DATA:  On the day of discharge, the patients white count was 25.1, hemoglobin 13, platelets 415.  Her sputum culture was still pending on the day of discharge. Dictated by:   Dr. Hanley Hays Attending Physician:  Phifer, Trinna Post DD:  10/07/01 TD:  10/08/01 Job: [id number]

## 2010-09-18 MED ORDER — OMALIZUMAB 150 MG ~~LOC~~ SOLR
225.0000 mg | Freq: Once | SUBCUTANEOUS | Status: AC
  Administered 2010-09-18: 225 mg via SUBCUTANEOUS

## 2010-10-01 ENCOUNTER — Encounter: Payer: Self-pay | Admitting: *Deleted

## 2010-10-01 ENCOUNTER — Ambulatory Visit (INDEPENDENT_AMBULATORY_CARE_PROVIDER_SITE_OTHER): Payer: 59

## 2010-10-01 DIAGNOSIS — J45909 Unspecified asthma, uncomplicated: Secondary | ICD-10-CM

## 2010-10-02 MED ORDER — OMALIZUMAB 150 MG ~~LOC~~ SOLR
225.0000 mg | Freq: Once | SUBCUTANEOUS | Status: AC
  Administered 2010-10-02: 225 mg via SUBCUTANEOUS

## 2010-10-05 ENCOUNTER — Telehealth: Payer: Self-pay | Admitting: Internal Medicine

## 2010-10-05 MED ORDER — ALBUTEROL SULFATE HFA 108 (90 BASE) MCG/ACT IN AERS: 2.0000 | INHALATION_SPRAY | Freq: Four times a day (QID) | RESPIRATORY_TRACT | Status: DC | PRN

## 2010-10-05 NOTE — Telephone Encounter (Signed)
Spoke with pt and notified that sample of dulera was left up front. No proair, so rx sent to her pharm per pt request.

## 2010-10-14 ENCOUNTER — Inpatient Hospital Stay (INDEPENDENT_AMBULATORY_CARE_PROVIDER_SITE_OTHER)
Admission: RE | Admit: 2010-10-14 | Discharge: 2010-10-14 | Disposition: A | Discharge status: Home / Self Care | Payer: 59 | Source: Ambulatory Visit

## 2010-10-14 ENCOUNTER — Ambulatory Visit (INDEPENDENT_AMBULATORY_CARE_PROVIDER_SITE_OTHER): Payer: 59

## 2010-10-14 DIAGNOSIS — S93409A Sprain of unspecified ligament of unspecified ankle, initial encounter: Secondary | ICD-10-CM

## 2010-10-14 DIAGNOSIS — J45909 Unspecified asthma, uncomplicated: Secondary | ICD-10-CM

## 2010-10-15 ENCOUNTER — Ambulatory Visit: Payer: 59

## 2010-10-16 ENCOUNTER — Ambulatory Visit (INDEPENDENT_AMBULATORY_CARE_PROVIDER_SITE_OTHER): Payer: 59 | Admitting: Internal Medicine

## 2010-10-16 ENCOUNTER — Encounter: Payer: Self-pay | Admitting: Internal Medicine

## 2010-10-16 DIAGNOSIS — I10 Essential (primary) hypertension: Secondary | ICD-10-CM

## 2010-10-16 DIAGNOSIS — J45909 Unspecified asthma, uncomplicated: Secondary | ICD-10-CM

## 2010-10-16 DIAGNOSIS — G4700 Insomnia, unspecified: Secondary | ICD-10-CM

## 2010-10-16 DIAGNOSIS — M549 Dorsalgia, unspecified: Secondary | ICD-10-CM

## 2010-10-16 MED ORDER — PREDNISONE (PAK) 10 MG PO TABS: 10.0000 mg | ORAL_TABLET | Freq: Every day | ORAL | Status: DC

## 2010-10-16 MED ORDER — TRAZODONE HCL 50 MG PO TABS: 50.0000 mg | ORAL_TABLET | Freq: Every day | ORAL | Status: DC

## 2010-10-16 MED ORDER — TRAMADOL HCL 50 MG PO TABS: 50.0000 mg | ORAL_TABLET | Freq: Four times a day (QID) | ORAL | Status: DC | PRN

## 2010-10-16 MED ORDER — HYDROCHLOROTHIAZIDE 50 MG PO TABS: 50.0000 mg | ORAL_TABLET | Freq: Every day | ORAL | Status: DC

## 2010-10-16 NOTE — Assessment & Plan Note (Signed)
Patient noted that she had hallucination with Ambien. She has troubles to stay asleep. I changed her to trazodone.

## 2010-10-16 NOTE — Assessment & Plan Note (Addendum)
Likely at this point asthma exacerbation due to upper respiratory infection. Will provide a prednisone taper for 5 days. Recommended to continue her current regimen. Informed her if her symptoms worsen and she is requiring significant increase in albuterol use to call the clinic for further evaluation or even go to the ED. Patient had history recurrent hospitalization due to asthma exacerbation in the past. She is followed up by pulmonology and was last seen in May. Recommended to followup  on a regular basis for possible changes in medical management. Patient is currently receiving twice a month Xolair.   Patient will undergo breast reduction so she June 27 . Patient may need stress dose steroids after the surgery when experiencing hypotension due to recurrent use of steroids for asthma exacerbation

## 2010-10-16 NOTE — Progress Notes (Signed)
  Subjective:    Patient ID: April [name], female    DOB: 06-21-1970, 40 y.o.   MRN: [id number]  HPI This is a 40 year old female with posthitis significant for chronic asthma with recurrent asthma exacerbation and hospitalization, chronic back pain depression and insomnia who presented to the clinic with complaints of #1 asthma: Patient noted that she has increased her albuterol use to rule 5-6 times daily to due shortness of breath and wheezing. It is worse in the morning.  She further mentioned that she had nasal congestion and headache since a few days. Denies any fevers or chills. #2 back pain: Patient has chronic back pain since years. Has used meloxicam, Tylenol plus codeine, Percocet, Vicodin and naproxen in the past #3 insomnia: Patient noted that she has trouble staying asleep. Ambien is working but it causes her hallucinations  Patient will undergo breast reduction so she June 27 .   Review of Systems  Constitutional: Negative for fever and chills.  HENT: Positive for congestion, rhinorrhea and postnasal drip.   Respiratory: Positive for cough, chest tightness, wheezing and stridor.   Cardiovascular: Positive for palpitations. Negative for chest pain.  Gastrointestinal: Negative for abdominal pain and abdominal distention.  Genitourinary: Negative for difficulty urinating.  Musculoskeletal: Positive for back pain.  Neurological: Positive for weakness and numbness. Negative for dizziness and light-headedness.       Objective:   Physical Exam  Constitutional: She is oriented to person, place, and time. She appears well-developed and well-nourished.  HENT:  Head: Normocephalic.  Neck: Neck supple.  Cardiovascular: Normal rate, regular rhythm and normal heart sounds.   Pulmonary/Chest: Effort normal. She has wheezes. She has no rales.  Abdominal: Soft. Bowel sounds are normal.  Musculoskeletal:       Thoracic back: She exhibits tenderness.       Lumbar back: She exhibits  decreased range of motion and tenderness. She exhibits no bony tenderness.  Neurological: She is alert and oriented to person, place, and time.  Skin: No rash noted.          Assessment & Plan:

## 2010-10-16 NOTE — Assessment & Plan Note (Signed)
Agents blood pressure is elevated even after rechecking. Patient's alleged to ACE inhibitors and I do not want to start any beta blocker at this point. Patient's undergoing breast reduction on June 27. I will increase hydrochlorothiazide to 50 mg daily and I'll wait her blood pressure at the next office visit for possible changes in management.

## 2010-10-16 NOTE — Assessment & Plan Note (Addendum)
Patient has chronic back pain since 2009. X-ray of the cervical spine shows degenerative disease. Patient been given in the past Vicodin, Percocet, meloxicam, Tylenol/codeine and naproxen. Provide some relief in the first 1-2 weeks. I would provide the patient today with the tramadol for pain relief. I referred the patient to sports medicine for further evaluation and management. I advised the patient significant weight reduction with exercise and diet. Patient was interested in April [name]'s extreme make over class. Patient will undergo breast reduction so she June 27 which possibly will help with her weight and back pain.

## 2010-10-17 ENCOUNTER — Ambulatory Visit: Payer: 59

## 2010-10-18 ENCOUNTER — Ambulatory Visit (INDEPENDENT_AMBULATORY_CARE_PROVIDER_SITE_OTHER): Payer: 59

## 2010-10-18 ENCOUNTER — Telehealth: Payer: Self-pay | Admitting: *Deleted

## 2010-10-18 DIAGNOSIS — J45909 Unspecified asthma, uncomplicated: Secondary | ICD-10-CM

## 2010-10-18 NOTE — Telephone Encounter (Signed)
Pt calls stating she is not happy with the meds given at her last appt and she needs to see a md and get all this straight, she states she does not want to do anything to stop her upcoming surgery and needs more notes for work, she is given an appt for 6/22 at 1345

## 2010-10-19 ENCOUNTER — Encounter: Payer: Self-pay | Admitting: Internal Medicine

## 2010-10-19 ENCOUNTER — Ambulatory Visit (INDEPENDENT_AMBULATORY_CARE_PROVIDER_SITE_OTHER): Payer: 59 | Admitting: Internal Medicine

## 2010-10-19 VITALS — BP 132/89 | HR 81 | Temp 97.9°F | Ht 66.0 in | Wt 359.3 lb

## 2010-10-19 DIAGNOSIS — M549 Dorsalgia, unspecified: Secondary | ICD-10-CM

## 2010-10-19 DIAGNOSIS — J45909 Unspecified asthma, uncomplicated: Secondary | ICD-10-CM

## 2010-10-19 MED ORDER — IPRATROPIUM BROMIDE 0.02 % IN SOLN: 500.0000 ug | Freq: Four times a day (QID) | RESPIRATORY_TRACT | Status: DC | PRN

## 2010-10-19 MED ORDER — OMALIZUMAB 150 MG ~~LOC~~ SOLR
225.0000 mg | Freq: Once | SUBCUTANEOUS | Status: AC
  Administered 2010-10-19: 225 mg via SUBCUTANEOUS

## 2010-10-19 MED ORDER — OXYCODONE-ACETAMINOPHEN 5-325 MG PO TABS: 1.0000 | ORAL_TABLET | ORAL | Status: AC | PRN

## 2010-10-19 MED ORDER — ALBUTEROL SULFATE (2.5 MG/3ML) 0.083% IN NEBU: 2.5000 mg | INHALATION_SOLUTION | Freq: Four times a day (QID) | RESPIRATORY_TRACT | Status: DC | PRN

## 2010-10-19 MED ORDER — DIPHENHYDRAMINE HCL 25 MG PO TABS: 25.0000 mg | ORAL_TABLET | Freq: Four times a day (QID) | ORAL | Status: DC | PRN

## 2010-10-21 NOTE — Assessment & Plan Note (Signed)
The pain is still persistent. Ultram did not improve her symptoms. I'll prescribe her Percocet today until the surgery on June 27 where she will undergo breast reduction.

## 2010-10-21 NOTE — Progress Notes (Signed)
  Subjective:    Patient ID: April [name], female    DOB: 01-Apr-1971, 40 y.o.   MRN: [id number]  HPI This is a 40 year old female with posthitis significant for asthma, chronic back pain, obesity who presented to the clinic for reevaluation of her asthma. Patient was seen on June 19 by me for asthma exacerbation and was asked to start prednisone taper. Patient discussed this with the surgeon who wanted was planning to do a breast reduction surgery on June 27. He noted that the patient should not take any prednisone prior to surgery therefore the patient did not take it. She wanted to be reevaluated for the same. Patient noted that her symptoms did improve compared to the last office visit since she received her shots one day prior to office visit. She continues to use her albuterol inhaler. She endorsed that her back pain is not under control but she is willing to wait until the surgery in hopes it will get better after that.   Review of Systems  Constitutional: Negative for fever, chills and fatigue.  HENT: Positive for congestion, rhinorrhea and postnasal drip.   Respiratory: Positive for shortness of breath and wheezing. Negative for apnea and chest tightness.   Cardiovascular: Negative for chest pain and palpitations.  Gastrointestinal: Positive for abdominal distention. Negative for abdominal pain.  Genitourinary: Negative for difficulty urinating.  Musculoskeletal: Positive for myalgias, back pain and joint swelling.  Skin: Negative for rash.  Neurological: Negative for dizziness.       Objective:   Physical Exam  Constitutional: She is oriented to person, place, and time. She appears well-developed.  HENT:  Head: Normocephalic and atraumatic.  Neck: Neck supple.  Cardiovascular: Normal rate and regular rhythm.   Pulmonary/Chest: Effort normal. No respiratory distress. She has no wheezes. She has no rales.  Abdominal: Soft. Bowel sounds are normal. She exhibits distension. There is no  tenderness.  Musculoskeletal: Normal range of motion. She exhibits tenderness.  Neurological: She is alert and oriented to person, place, and time.          Assessment & Plan:

## 2010-10-21 NOTE — Assessment & Plan Note (Addendum)
She was noted to have improved peak flow reading which was about 600 during this office visit. Patient's physical exam was was improved compared to the last office visit. I informed the patient that she needs to continue her current medical regimen and use albuterol only when necessary. I would not restart the prednisone at this point. Patient needs to avoid any kind of exposure which could aggravate patient's asthma. Therefore the patient was advised to stay indoors as much as possible and to the surgery. I provided the patient a work note until his surgery.

## 2010-10-30 ENCOUNTER — Ambulatory Visit (INDEPENDENT_AMBULATORY_CARE_PROVIDER_SITE_OTHER): Payer: 59

## 2010-10-30 DIAGNOSIS — J45909 Unspecified asthma, uncomplicated: Secondary | ICD-10-CM

## 2010-10-30 MED ORDER — OMALIZUMAB 150 MG ~~LOC~~ SOLR
225.0000 mg | Freq: Once | SUBCUTANEOUS | Status: AC
  Administered 2010-10-30: 225 mg via SUBCUTANEOUS

## 2010-11-06 ENCOUNTER — Encounter: Payer: 59 | Admitting: Internal Medicine

## 2010-11-09 ENCOUNTER — Encounter: Payer: 59 | Admitting: Internal Medicine

## 2010-11-13 ENCOUNTER — Ambulatory Visit (INDEPENDENT_AMBULATORY_CARE_PROVIDER_SITE_OTHER): Payer: 59

## 2010-11-13 DIAGNOSIS — J45909 Unspecified asthma, uncomplicated: Secondary | ICD-10-CM

## 2010-11-14 MED ORDER — OMALIZUMAB 150 MG ~~LOC~~ SOLR
225.0000 mg | Freq: Once | SUBCUTANEOUS | Status: AC
  Administered 2010-11-14: 225 mg via SUBCUTANEOUS

## 2010-11-19 ENCOUNTER — Ambulatory Visit: Payer: 59 | Admitting: Family Medicine

## 2010-11-20 ENCOUNTER — Encounter: Payer: Self-pay | Admitting: Sports Medicine

## 2010-11-20 ENCOUNTER — Ambulatory Visit (INDEPENDENT_AMBULATORY_CARE_PROVIDER_SITE_OTHER): Payer: 59 | Admitting: Sports Medicine

## 2010-11-20 VITALS — BP 121/80 | HR 86 | Ht 66.0 in | Wt 320.0 lb

## 2010-11-20 DIAGNOSIS — M549 Dorsalgia, unspecified: Secondary | ICD-10-CM

## 2010-11-20 MED ORDER — GABAPENTIN 300 MG PO CAPS: ORAL_CAPSULE | ORAL | Status: DC

## 2010-11-20 NOTE — Patient Instructions (Signed)
Great to see you today,  I would like you to start gabapentin is below, Using heating pad 3 times a day, Start formal physical therapy which includes back school, Keep active overall. I would like to see you again in 4 weeks.  Ihor Austin. Benjamin Stain, M.D. [facility] [address], [place] [address] [phone]

## 2010-11-20 NOTE — Assessment & Plan Note (Signed)
April [name] seems to have classic lumbago not relieved by her recent breast reduction surgery. She's been through multiple analgesics including narcotics without much relief. It doesn't seem that she is actually been through formal therapy. I would like to start her on gabapentin which has significant evidence for reduction of back pain, as well as put her in a formal physical therapy program that can provide back strengthening, lifting techniques, back school. She's also to use a heating pad. She has no reflex to suggest an infectious or neoplastic cause to her back pain. I would like to see her back in 4 weeks.

## 2010-11-21 ENCOUNTER — Ambulatory Visit (INDEPENDENT_AMBULATORY_CARE_PROVIDER_SITE_OTHER): Payer: 59 | Admitting: Internal Medicine

## 2010-11-21 ENCOUNTER — Encounter: Payer: Self-pay | Admitting: Sports Medicine

## 2010-11-21 ENCOUNTER — Encounter: Payer: Self-pay | Admitting: Internal Medicine

## 2010-11-21 VITALS — BP 142/96 | HR 83 | Temp 98.0°F | Ht 66.0 in | Wt 350.5 lb

## 2010-11-21 DIAGNOSIS — G4700 Insomnia, unspecified: Secondary | ICD-10-CM

## 2010-11-21 DIAGNOSIS — I10 Essential (primary) hypertension: Secondary | ICD-10-CM

## 2010-11-21 MED ORDER — HYDROCHLOROTHIAZIDE 50 MG PO TABS: 25.0000 mg | ORAL_TABLET | Freq: Every day | ORAL | Status: DC

## 2010-11-21 MED ORDER — TEMAZEPAM 7.5 MG PO CAPS: 7.5000 mg | ORAL_CAPSULE | Freq: Every evening | ORAL | Status: DC | PRN

## 2010-11-21 NOTE — Progress Notes (Signed)
History of present illness: Ms. April [name] is a 40 year old woman with past medical history of asthma presents today for insomnia. She states that she has tried Ambien, trazodone, melatonin over-the-counter without any success. Ambien worked for her however it made her hallucinate.  She denies drinking caffeine, tea, sodas. She states that she goes to bed whenever she feels sleepy and does not have a set sleep and schedule. Sometimes she sleeps during the day because she cannot sleep at night; therefore her circadian rhythm is messed up.  Patient is mildly stressed out because of anticipation of the breast reduction surgery and that she has to return to work soon.  She reported her asthma is well controlled and that the back pain is being managed by sport medicine. She was started on gabapentin yesterday for back pain as well as physical therapy.  Patient is interested in losing weight and has bought a treadmill because she cannot exercise outside in the heat secondary to her asthma. No other complaints today. Patient reports having had Pap smear and mammogram in the past one year.  ROS: as per HPI  PE:  General: alert, obese, well-developed, and cooperative to examination.  Lungs: normal respiratory effort, no accessory muscle use, normal breath sounds, no crackles, and no wheezes. Heart: normal rate, regular rhythm, no murmur, no gallop, and no rub.  Abdomen: soft, non-tender, normal bowel sounds, no distention, no guarding, no rebound tenderness Msk: no joint swelling, no joint warmth, and no redness over joints.  Pulses: 2+ DP/PT pulses bilaterally Extremities: No cyanosis, clubbing, edema Neurologic: alert & oriented X3, cranial nerves II-XII intact, strength normal in all extremities, sensation intact to light touch, and gait normal.  Skin: turgor normal and no rashes.  Psych: Oriented X3, memory intact for recent and remote, normally interactive, good eye contact, not anxious appearing, and not  depressed appearing.

## 2010-11-21 NOTE — Progress Notes (Signed)
  Subjective:    Patient ID: April [name], female    DOB: 06/11/1970, 40 y.o.   MRN: [id number]  HPI The patient is a 40 year old obese African American female with chronic low back pain. She presents to [place] as a consultation from the [facility] internal medicine center.  She notes no specific date of onset of the pain, and has tried nearly every analgesic, NSAIDS, narcotic without relief. It does not seem that she's ever been through formal physical therapy. She said lumbo-sacral spine x-rays in 2007 which are overall unremarkable. She recently had a breast reduction which also did not relieve her back pain. She noticed the pain is worse with standing, ambulating. She denies any bowel or bladder incontinence, or any fevers, chills, weight loss, night sweats.  Review of Systems    negative except as noted above in the history of present illness Objective:   Physical Exam General: Well-developed, obese, African American female.    Back Exam: Inspection: Unremarkable Motion: Flexion 45 deg, Extension 45 deg, Side Bending to 45 deg bilaterally,  Rotation to 45 deg bilaterally SLR laying:  Negative XSLR laying: Negative Palpable tenderness: None FABER: negative Sensory change: Gross sensation intact to all lumbar and sacral dermatomes. Reflexes: 2+ at both patellar tendons, 2+ at achilles tendons, Babinski's downgoing.  Strength at foot Plantar-flexion: 5/5    Dorsi-flexion: 5/5    Eversion: 5/5   Inversion: 5/5 Leg strength Quad: 5/5   Hamstring: 5/5   Hip flexor: 5/5   Hip abductors: 5/5 Gait unremarkable.    Assessment & Plan:

## 2010-11-21 NOTE — Patient Instructions (Addendum)
Practice good sleep hygiene as we discussed: no caffeine, no TV, or any other distraction.  Set up at time when you go to bed like 9PM Take hydrochlorothiazide 25mg  one tablet daily Take Restoril 7.5mg  one tablet 30 minutes before bedtime Low salt diet and exercise Follow up with Dr. Anselm Jungling in 1 month

## 2010-11-21 NOTE — Assessment & Plan Note (Addendum)
Not improving. Ambien and Trazodone, Melatonin do not work for patient. I discussed good sleep hygiene in detailed with patient because that is the most important factor of her insomnia. Patient denies any psychological problems beside mild stress.   Will try a short course of Restoril 7.5 mg by mouth at bedtime Will start practicing good sleep hygiene including avoiding caffeine, sodas, TV or any other distraction in the bedroom.  She will also set up a schedule for sleep so that her sleep cycle will not be so erratic.   I will see patient back in one month

## 2010-11-21 NOTE — Assessment & Plan Note (Signed)
Adequately controlled. Upon reviewing her chart, patient was placed on hydrochlorothiazide 50 mg by mouth qd and Diovan 160 mg by mouth daily.  I do not think that the increased dose of HCTZ 50 mg is effective as HCTZ 25 mg. -Will change to HCTZ 25 mg by mouth daily -Will continue Diovan 160 mg by mouth daily -I will see patient back in 4 weeks to reevaluate her blood pressure. If her blood pressure continues to be elevated, I may add amlodipine to her regimen -Encourage low salt diet and exercise

## 2010-11-23 ENCOUNTER — Ambulatory Visit: Payer: 59 | Admitting: Family Medicine

## 2010-11-27 ENCOUNTER — Ambulatory Visit: Payer: 59 | Attending: Sports Medicine | Admitting: Physical Therapy

## 2010-11-28 ENCOUNTER — Telehealth: Payer: Self-pay | Admitting: Internal Medicine

## 2010-11-28 ENCOUNTER — Ambulatory Visit (INDEPENDENT_AMBULATORY_CARE_PROVIDER_SITE_OTHER): Payer: 59

## 2010-11-28 DIAGNOSIS — J45909 Unspecified asthma, uncomplicated: Secondary | ICD-10-CM

## 2010-11-28 NOTE — Telephone Encounter (Signed)
Pt coming in now.

## 2010-11-28 NOTE — Telephone Encounter (Signed)
Sample of Dulera 100-5 and Ventolin HFA given to patient.

## 2010-11-29 ENCOUNTER — Ambulatory Visit: Payer: 59 | Attending: Sports Medicine | Admitting: Rehabilitative and Restorative Service Providers"

## 2010-11-29 DIAGNOSIS — IMO0001 Reserved for inherently not codable concepts without codable children: Secondary | ICD-10-CM | POA: Insufficient documentation

## 2010-11-29 DIAGNOSIS — M545 Low back pain, unspecified: Secondary | ICD-10-CM | POA: Insufficient documentation

## 2010-11-29 DIAGNOSIS — M2560 Stiffness of unspecified joint, not elsewhere classified: Secondary | ICD-10-CM | POA: Insufficient documentation

## 2010-11-30 MED ORDER — OMALIZUMAB 150 MG ~~LOC~~ SOLR
225.0000 mg | Freq: Once | SUBCUTANEOUS | Status: AC
  Administered 2010-11-30: 225 mg via SUBCUTANEOUS

## 2010-12-03 ENCOUNTER — Encounter: Payer: 59 | Admitting: Rehabilitative and Restorative Service Providers"

## 2010-12-10 ENCOUNTER — Ambulatory Visit (INDEPENDENT_AMBULATORY_CARE_PROVIDER_SITE_OTHER): Payer: 59

## 2010-12-10 DIAGNOSIS — J45909 Unspecified asthma, uncomplicated: Secondary | ICD-10-CM

## 2010-12-10 MED ORDER — OMALIZUMAB 150 MG ~~LOC~~ SOLR
225.0000 mg | Freq: Once | SUBCUTANEOUS | Status: AC
  Administered 2010-12-10: 225 mg via SUBCUTANEOUS

## 2010-12-11 ENCOUNTER — Ambulatory Visit: Payer: 59 | Admitting: Dietician

## 2010-12-11 ENCOUNTER — Ambulatory Visit: Payer: 59 | Admitting: Sports Medicine

## 2010-12-11 NOTE — Telephone Encounter (Signed)
Addended by: Remus Blake on: 12/11/2010 04:42 PM   Modules accepted: Orders

## 2010-12-12 ENCOUNTER — Encounter: Payer: Self-pay | Admitting: Internal Medicine

## 2010-12-13 ENCOUNTER — Encounter: Payer: 59 | Admitting: Physical Therapy

## 2010-12-14 ENCOUNTER — Ambulatory Visit: Payer: 59 | Admitting: Physical Therapy

## 2010-12-15 ENCOUNTER — Other Ambulatory Visit: Payer: Self-pay | Admitting: Internal Medicine

## 2010-12-18 ENCOUNTER — Encounter: Payer: Self-pay | Admitting: Internal Medicine

## 2010-12-18 ENCOUNTER — Ambulatory Visit (INDEPENDENT_AMBULATORY_CARE_PROVIDER_SITE_OTHER): Payer: 59 | Admitting: Internal Medicine

## 2010-12-18 ENCOUNTER — Ambulatory Visit: Payer: 59 | Admitting: Family Medicine

## 2010-12-18 DIAGNOSIS — G4700 Insomnia, unspecified: Secondary | ICD-10-CM

## 2010-12-18 DIAGNOSIS — M549 Dorsalgia, unspecified: Secondary | ICD-10-CM

## 2010-12-18 DIAGNOSIS — G8929 Other chronic pain: Secondary | ICD-10-CM

## 2010-12-18 DIAGNOSIS — R51 Headache: Secondary | ICD-10-CM

## 2010-12-18 DIAGNOSIS — E876 Hypokalemia: Secondary | ICD-10-CM

## 2010-12-18 DIAGNOSIS — Z23 Encounter for immunization: Secondary | ICD-10-CM

## 2010-12-18 DIAGNOSIS — G4730 Sleep apnea, unspecified: Secondary | ICD-10-CM

## 2010-12-18 MED ORDER — ZOLPIDEM TARTRATE 10 MG PO TABS: 10.0000 mg | ORAL_TABLET | Freq: Every evening | ORAL | Status: DC | PRN

## 2010-12-18 MED ORDER — IBUPROFEN 800 MG PO TABS: 800.0000 mg | ORAL_TABLET | Freq: Three times a day (TID) | ORAL | Status: AC | PRN

## 2010-12-18 NOTE — Progress Notes (Signed)
History of present illness: April [name] is a 40 year old woman with past medical history of chronic back pain status post bilateral breast reduction, asthma, hypertension presents today for back pain and insomnia.  Sleep apnea- she stops breathing for several seconds at night per husband.  She had a sleep study several years ago and would like another sleep study to evaluate if she needs CPAP at night.  Patient has never used  CPAP machine before.  Back pain- all over but especially on lower back.  She is currently being followed by sport medicine. Reports that physical therapy actually makes her feel worst and that the heating pad is not helping.  She states that she had low potassium in the past and the supplements helped.  She recently started working again which she is trying to adjust to after being out of work for so long because of her medical conditions.  Insomnia- cannot sleep at night despite trying trazodone, restoril and wants to be back on Ambien even though Ambien makes her have dreams.  She states that she can tolerate the dreams and it is not bothering her. She is also interested in loosing weight and asked about medications to help with the weight loss.  She states that she normally does not eat a lot and is working out at [employer]; however, there is some limitation 2/2 to her pain. No other complaints today.  Review of system: As per history of present illness  Physical examination: General: obese, alert, well-developed, and cooperative to examination.   Lungs: normal respiratory effort, no accessory muscle use, normal breath sounds, no crackles, and no wheezes. Heart: normal rate, regular rhythm, no murmur, no gallop, and no rub.  Abdomen: obese, soft, non-tender, normal bowel sounds, no distention, no guarding, no rebound tenderness Msk: no joint swelling, no joint warmth, and no redness over joints. There are point tenderness on upper and lower back upon palpation. Extremities: No  cyanosis, clubbing, edema Neurologic: alert & oriented X3, cranial nerves II-XII intact, strength normal in all extremities, sensation intact to light touch, and gait normal.  Skin: turgor normal and no rashes.  Psych: Oriented X3, memory intact for recent and remote, normally interactive, good eye contact, not anxious appearing, and not depressed appearing.

## 2010-12-18 NOTE — Patient Instructions (Signed)
Will get sleep study, we will call you with an appointment Take Ambien 10 mg one tablet at bedtime for sleep You can take Ibuprofen 800mg  one tablet every 8 hours for headache/backpain Will check labs today, I will call you with any abnormal lab results Please schedule an appointment for PAP smear next office visit Follow up in 4 wks

## 2010-12-19 DIAGNOSIS — G4733 Obstructive sleep apnea (adult) (pediatric): Secondary | ICD-10-CM | POA: Insufficient documentation

## 2010-12-19 LAB — BASIC METABOLIC PANEL WITH GFR
BUN: 12 mg/dL (ref 6–23)
CO2: 25 mEq/L (ref 19–32)
Chloride: 102 mEq/L (ref 96–112)
Creat: 0.92 mg/dL (ref 0.50–1.10)
Potassium: 4 mEq/L (ref 3.5–5.3)

## 2010-12-19 NOTE — Assessment & Plan Note (Signed)
Patient failed Trazodone, Restoril and good sleep hygiene.  Patient reports that Ambien actually helped her sleep better even though she does have more dreams while on this medication.  I also explained to her that Ambien is not a long-term medication and I would like to wean her off once her circadian rhythm is back to normal. -Will restart Ambien 10mg  po qhs

## 2010-12-19 NOTE — Assessment & Plan Note (Signed)
Muscle cramping could very likely be due to hypokalemia as patient does have a history of hypokalemia.  I question if patient has hyper-aldosteronism if she is hypertensive and hypokalemic? -Will check BMP -Will replete if K is low

## 2010-12-19 NOTE — Assessment & Plan Note (Signed)
Clinical description is consistent with sleep apnea.  She did have a sleep study several years ago but I cannot locate the results.   -Will send for another sleep study to see if she is qualified for CPAP machine at night which might help her significantly with her insomnia.

## 2010-12-19 NOTE — Assessment & Plan Note (Addendum)
Not improving despite breast reduction.  Patient is currently followed by sport medicine.  She reports that heating pad and physical therapy is not really helping. She has also tried multiple NSAIDs and narcotics in the past without much relief.   I reviewed her L-S Xray in 2007, did not show any significant abnormalities.  She does have point tenderness on her back diffusely which could be musculoskeletal/fibromyalgia/somatization?  I discussed the case with Dr. Aundria Rud and we both agreed that there is no indication for MRI of back at this time. -Advised patient to try ibuprofen PRN for pain. Prescribed ibuprofen 800mg  q8hr prn -Need to follow sport medicine's recommendation

## 2010-12-19 NOTE — Assessment & Plan Note (Signed)
I discussed the side effects of weight loss medication and suggested that patient try conservative methods first including cutting down on her calories intake.   -Continue exercise and diet -Will consider other options if she still cannot loose weight despite diet and exercise

## 2010-12-20 ENCOUNTER — Encounter: Payer: 59 | Admitting: Rehabilitative and Restorative Service Providers"

## 2010-12-21 ENCOUNTER — Ambulatory Visit (INDEPENDENT_AMBULATORY_CARE_PROVIDER_SITE_OTHER): Payer: 59 | Admitting: Family Medicine

## 2010-12-21 ENCOUNTER — Encounter: Payer: Self-pay | Admitting: Family Medicine

## 2010-12-21 ENCOUNTER — Encounter: Payer: 59 | Admitting: Physical Therapy

## 2010-12-21 VITALS — BP 134/84 | HR 80

## 2010-12-21 DIAGNOSIS — M549 Dorsalgia, unspecified: Secondary | ICD-10-CM

## 2010-12-21 NOTE — Assessment & Plan Note (Signed)
Discussed with this patient that her weight is a significant contributor to her back pain.  I would not expect this pain to resolve without a significant weight loss.  She was given information for [employer] as well as info on how to access on-demand programming for cardio and core strengthening that she can do at home.  She will also decide on starting weight watcher's or trying to modify her diet on her own.  If she decides to do it on her own her homework for this month will commit to 6 cups of veggies a day.  Her weight loss goal is 1-2 lbs per week and we will see her in one month for a progress report.  She will bring a food diary to her follow up visit so that we can help her assess her true caloric intake.

## 2010-12-21 NOTE — Progress Notes (Signed)
  Subjective:    Patient ID: April [name], female    DOB: [birth year], 40 y.o.   MRN: [id number]  HPI 40 y/o female is here to follow up for chronic back pain.  The pain is global.  There are no specific aggravating and alleviating factors.  She gets intermittent pain all of the time dating back to at least 1997.  No trauma.  She drives a forklift.  She tried physical therapy but that made her pain worse.  The gabapentin has no effect.  She had a breast reduction in June and says that her upper back pain is worse now.   Review of Systems     Objective:   Physical Exam GEN morbidly obese, NAD Back: Flexes, extends, rotates and bends laterally with pain in all directions.  Range is limited by body habitus Strength is globally decreased due to deconditioning.  No focal loss of function. DTR's 2+bilat Globally tender to palpation, midline and paraspinal        Assessment & Plan:  Generalized back pain I think mostly related to deconditioning and body habitus. We spent greater than 50% of our 35 minute office visit in counseling education regarding these issues. We discussed weight loss plans and musculoskeletal strengthening programs. One option was to delay plan tetanus and another was to use exercise programs on cable TV. We also discussed Weight Watchers. She is going to start a weight loss and exercise plan I'll see her back in 4-6 weeks.

## 2010-12-27 ENCOUNTER — Telehealth: Payer: Self-pay | Admitting: Internal Medicine

## 2010-12-27 ENCOUNTER — Encounter: Payer: 59 | Admitting: Physical Therapy

## 2010-12-27 ENCOUNTER — Ambulatory Visit (INDEPENDENT_AMBULATORY_CARE_PROVIDER_SITE_OTHER): Payer: 59

## 2010-12-27 DIAGNOSIS — J45909 Unspecified asthma, uncomplicated: Secondary | ICD-10-CM

## 2010-12-27 NOTE — Telephone Encounter (Signed)
1 sample of dulera 100 up front for pick up, no sample of proair or other rescue inhaler at this time. LMOM for pt to be made aware.

## 2010-12-28 ENCOUNTER — Encounter: Payer: 59 | Admitting: Physical Therapy

## 2010-12-28 MED ORDER — OMALIZUMAB 150 MG ~~LOC~~ SOLR
225.0000 mg | Freq: Once | SUBCUTANEOUS | Status: AC
  Administered 2010-12-28: 225 mg via SUBCUTANEOUS

## 2011-01-10 ENCOUNTER — Telehealth: Payer: Self-pay | Admitting: Internal Medicine

## 2011-01-10 ENCOUNTER — Ambulatory Visit (INDEPENDENT_AMBULATORY_CARE_PROVIDER_SITE_OTHER): Payer: 59

## 2011-01-10 DIAGNOSIS — J45909 Unspecified asthma, uncomplicated: Secondary | ICD-10-CM

## 2011-01-10 MED ORDER — OMALIZUMAB 150 MG ~~LOC~~ SOLR
225.0000 mg | Freq: Once | SUBCUTANEOUS | Status: AC
  Administered 2011-01-10: 225 mg via SUBCUTANEOUS

## 2011-01-10 NOTE — Telephone Encounter (Signed)
Called and spoke with April [name] at [facility] and was informed pt's next xolair shipment is scheduled to go out 9/18.  This is just an [place].

## 2011-01-16 ENCOUNTER — Ambulatory Visit: Payer: 59 | Admitting: Sports Medicine

## 2011-01-18 LAB — URINALYSIS, ROUTINE W REFLEX MICROSCOPIC
Bilirubin Urine: NEGATIVE
Glucose, UA: NEGATIVE
Hgb urine dipstick: NEGATIVE
Ketones, ur: NEGATIVE
Protein, ur: NEGATIVE
Urobilinogen, UA: 1

## 2011-01-18 LAB — POCT I-STAT CREATININE: Creatinine, Ser: 1

## 2011-01-18 LAB — DIFFERENTIAL
Basophils Relative: 0
Eosinophils Relative: 0
Eosinophils Relative: 1
Lymphocytes Relative: 15
Lymphocytes Relative: 19
Lymphs Abs: 0.9
Monocytes Relative: 1 — ABNORMAL LOW
Monocytes Relative: 21 — ABNORMAL HIGH
Neutro Abs: 5.2

## 2011-01-18 LAB — COMPREHENSIVE METABOLIC PANEL
ALT: 21
AST: 33
CO2: 22
Chloride: 108
Creatinine, Ser: 0.76
GFR calc Af Amer: >60
GFR calc non Af Amer: >60
Glucose, Bld: 220 — ABNORMAL HIGH
Sodium: 137
Total Bilirubin: <0.1 — ABNORMAL LOW

## 2011-01-18 LAB — CBC
HCT: 36
Hemoglobin: 12.3
Platelets: 365
RBC: 4.27
RBC: 4.34
WBC: 6.1

## 2011-01-18 LAB — I-STAT 8, (EC8 V) (CONVERTED LAB)
BUN: 4 — ABNORMAL LOW
Chloride: 106
Glucose, Bld: 100 — ABNORMAL HIGH
Hemoglobin: 13.6
Potassium: 3.6
Sodium: 138
TCO2: 23

## 2011-01-18 LAB — POCT PREGNANCY, URINE
Operator id: 294501
Preg Test, Ur: NEGATIVE

## 2011-01-18 LAB — INFLUENZA A+B VIRUS AG-DIRECT(RAPID): Influenza B Ag: NEGATIVE

## 2011-01-18 LAB — RAPID STREP SCREEN (MED CTR MEBANE ONLY): Streptococcus, Group A Screen (Direct): NEGATIVE

## 2011-01-24 ENCOUNTER — Ambulatory Visit (INDEPENDENT_AMBULATORY_CARE_PROVIDER_SITE_OTHER): Payer: 59

## 2011-01-24 DIAGNOSIS — J45909 Unspecified asthma, uncomplicated: Secondary | ICD-10-CM

## 2011-01-25 ENCOUNTER — Ambulatory Visit (HOSPITAL_BASED_OUTPATIENT_CLINIC_OR_DEPARTMENT_OTHER): Payer: 59

## 2011-01-25 DIAGNOSIS — J45909 Unspecified asthma, uncomplicated: Secondary | ICD-10-CM

## 2011-01-25 MED ORDER — OMALIZUMAB 150 MG ~~LOC~~ SOLR
225.0000 mg | Freq: Once | SUBCUTANEOUS | Status: AC
  Administered 2011-01-25: 225 mg via SUBCUTANEOUS

## 2011-01-30 LAB — MAGNESIUM: Magnesium: 2.2

## 2011-01-30 LAB — VITAMIN B12: Vitamin B-12: 285 (ref 211–911)

## 2011-01-30 LAB — COMPREHENSIVE METABOLIC PANEL
AST: 20
Albumin: 3.1 — ABNORMAL LOW
Alkaline Phosphatase: 58
BUN: 8
Chloride: 109
Potassium: 3.6
Total Bilirubin: 0.2 — ABNORMAL LOW

## 2011-01-30 LAB — URINALYSIS, ROUTINE W REFLEX MICROSCOPIC
Nitrite: NEGATIVE
Specific Gravity, Urine: 1.023
Urobilinogen, UA: 1

## 2011-01-30 LAB — DIFFERENTIAL
Basophils Absolute: 0
Basophils Relative: 0
Neutro Abs: 9.1 — ABNORMAL HIGH
Neutrophils Relative %: 87 — ABNORMAL HIGH

## 2011-01-30 LAB — IRON AND TIBC: TIBC: 416

## 2011-01-30 LAB — RETICULOCYTES
RBC.: 4.1
Retic Ct Pct: 1.2

## 2011-01-30 LAB — CBC
MCHC: 32.9
RDW: 16.1 — ABNORMAL HIGH

## 2011-01-30 LAB — POCT I-STAT, CHEM 8
BUN: 7
Calcium, Ion: 1.2
Chloride: 108

## 2011-01-30 LAB — POCT PREGNANCY, URINE: Preg Test, Ur: NEGATIVE

## 2011-01-30 LAB — FOLATE: Folate: 7.5

## 2011-01-30 LAB — URINE CULTURE

## 2011-01-30 LAB — PHOSPHORUS: Phosphorus: 3.2

## 2011-02-06 ENCOUNTER — Telehealth: Payer: Self-pay | Admitting: Internal Medicine

## 2011-02-06 NOTE — Telephone Encounter (Signed)
lmomtcb to advise we have no samples of dulera but 1 sample of ventolin was left upfront for p/u

## 2011-02-07 ENCOUNTER — Ambulatory Visit (INDEPENDENT_AMBULATORY_CARE_PROVIDER_SITE_OTHER): Payer: 59

## 2011-02-07 ENCOUNTER — Telehealth: Payer: Self-pay | Admitting: Internal Medicine

## 2011-02-07 DIAGNOSIS — J45909 Unspecified asthma, uncomplicated: Secondary | ICD-10-CM

## 2011-02-07 MED ORDER — OMALIZUMAB 150 MG ~~LOC~~ SOLR
225.0000 mg | Freq: Once | SUBCUTANEOUS | Status: AC
  Administered 2011-02-07: 225 mg via SUBCUTANEOUS

## 2011-02-07 NOTE — Telephone Encounter (Signed)
Pt states she does not have money to get [facility] until next week when she gets paid. Sample left at front desk. Julaine Hua, CMA

## 2011-02-07 NOTE — Telephone Encounter (Signed)
lmomtcb x1 

## 2011-02-08 NOTE — Telephone Encounter (Signed)
Pt says she has already spoke with someone from our office regarding samples and nothing further is needed.

## 2011-02-10 IMAGING — CR DG FINGER INDEX 2+V*R*
5 series · 5 of 5 positions shown · non-contrast
Comparison: None.

CLINICAL DATA: Injury right index finger greater than 1 week ago.
Diffuse pain.

RIGHT INDEX FINGER 2+V 01/25/2010:

[x finger pa right]
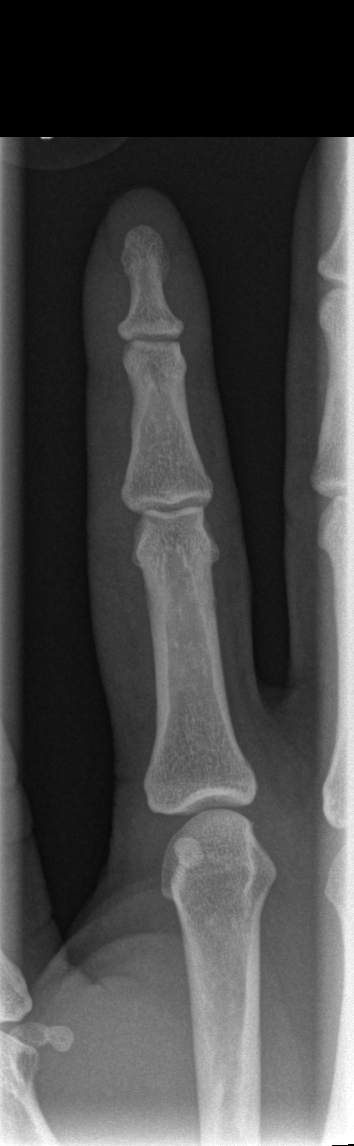

[x finger obl. right]
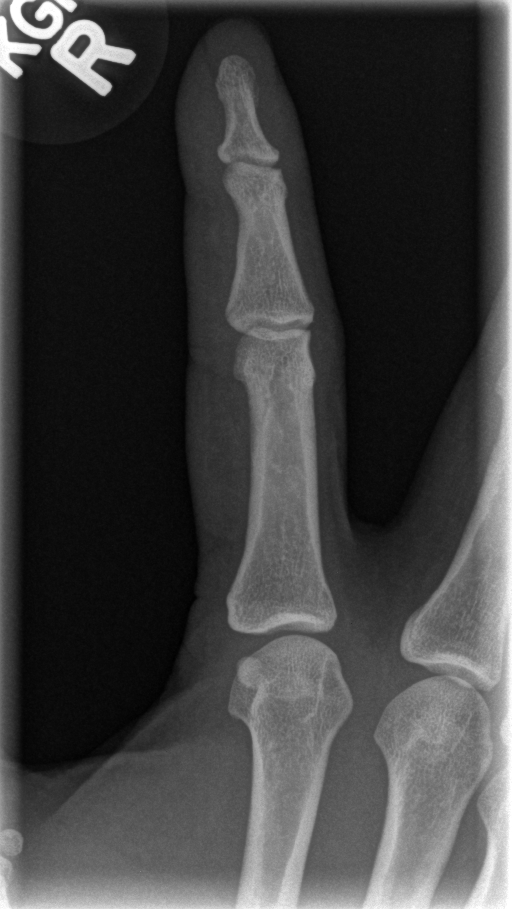

[x finger lateral right (1 of 2)]
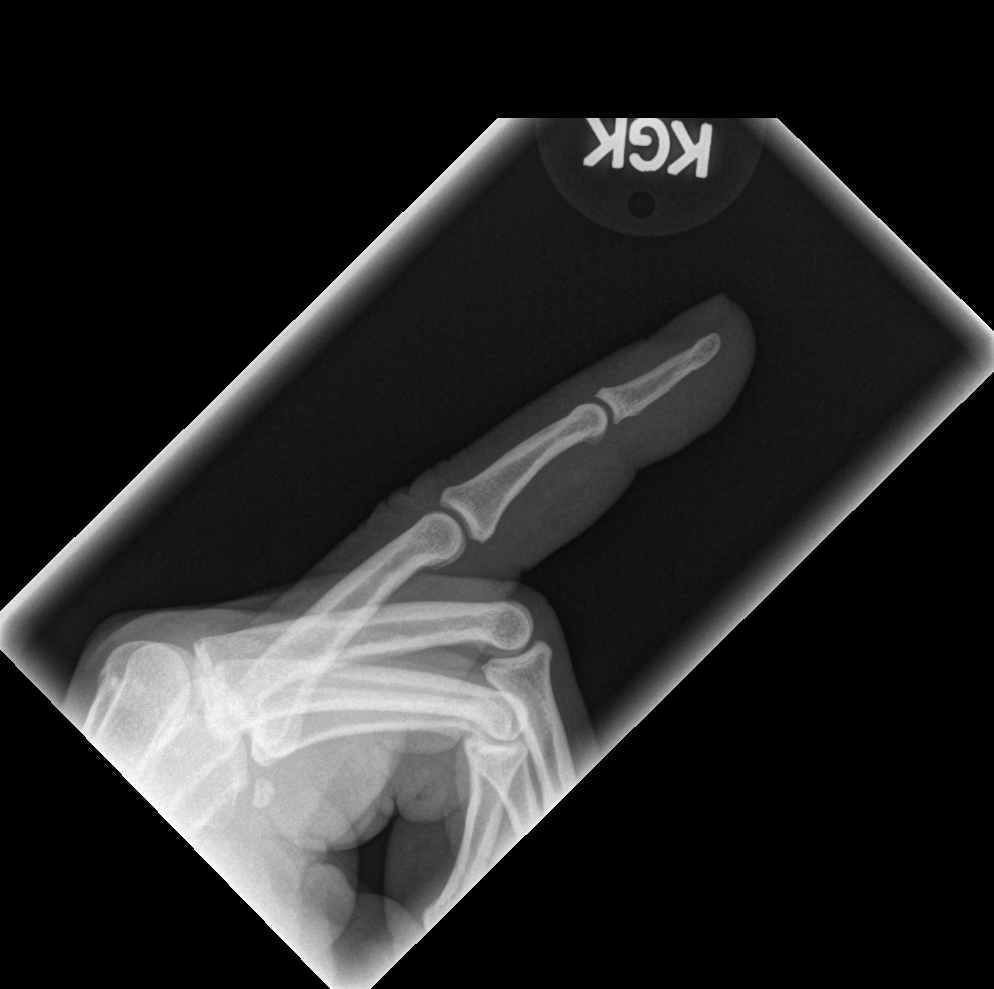

[x finger lateral right (2 of 2)]
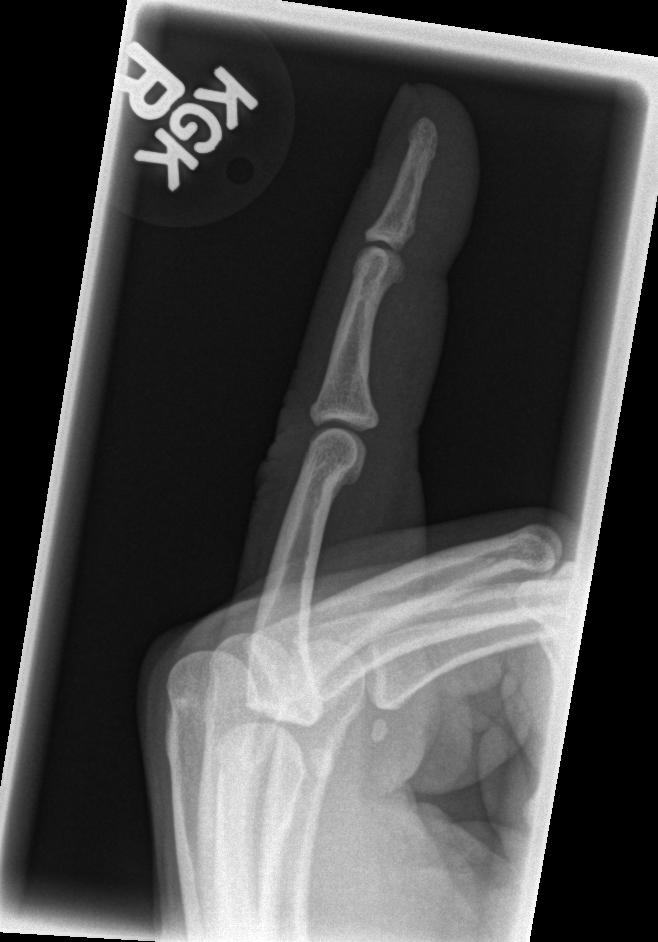

[w finger lateral right *]
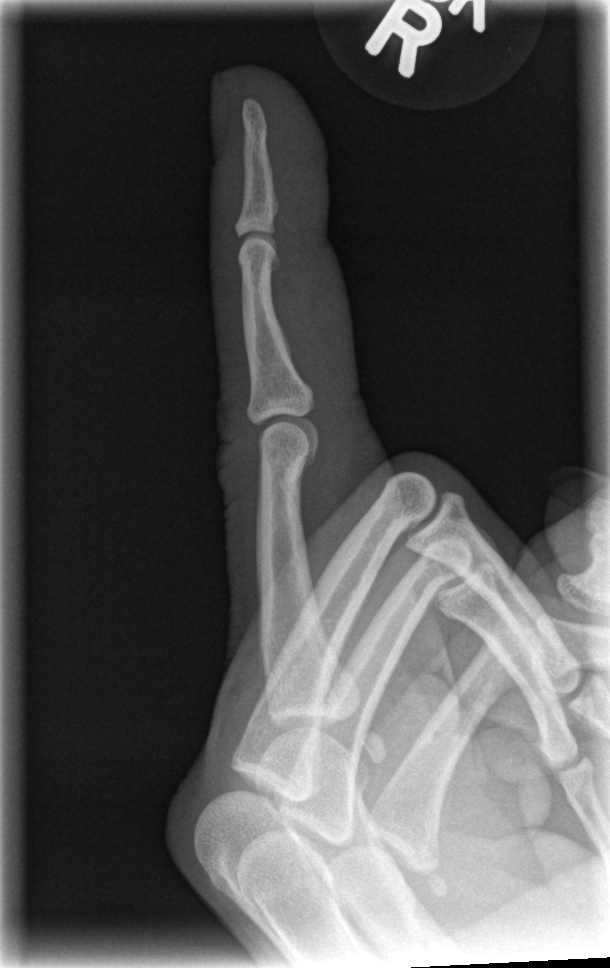

[5 of 5 positions shown; findings below may reference images not displayed]

FINDINGS: No evidence of acute or subacute fracture or dislocation.
Well-preserved joint spaces.  Well-preserved bone mineral density.
No intrinsic osseous abnormalities.  Soft tissue swelling
proximally.
IMPRESSION: No osseous abnormality.

## 2011-02-22 ENCOUNTER — Ambulatory Visit (INDEPENDENT_AMBULATORY_CARE_PROVIDER_SITE_OTHER): Payer: 59

## 2011-02-22 DIAGNOSIS — J45909 Unspecified asthma, uncomplicated: Secondary | ICD-10-CM

## 2011-02-25 DIAGNOSIS — J45909 Unspecified asthma, uncomplicated: Secondary | ICD-10-CM

## 2011-02-25 MED ORDER — OMALIZUMAB 150 MG ~~LOC~~ SOLR
225.0000 mg | Freq: Once | SUBCUTANEOUS | Status: AC
  Administered 2011-02-25: 225 mg via SUBCUTANEOUS

## 2011-02-28 ENCOUNTER — Telehealth: Payer: Self-pay | Admitting: Pulmonary Disease

## 2011-02-28 NOTE — Telephone Encounter (Signed)
Called and spoke with Asher Muir from [facility] and verified next shipment for Xolair scheduled to arrive here on 03/05/11.  This is just an [place].  Will sign message.

## 2011-03-06 ENCOUNTER — Telehealth: Payer: Self-pay | Admitting: Internal Medicine

## 2011-03-06 NOTE — Telephone Encounter (Signed)
lmomtcb  

## 2011-03-07 NOTE — Telephone Encounter (Signed)
Called and spoke with pt. Pt states her shipment of xolair was supposed to arrive this week but was informed by Tammy S that it wouldn't come until next week.  Pt states this was do to her ins. Denying shipment until next week.   i reviewed pt's chart, and saw previous phone note from 02/28/11 where I spoke with Asher Muir from [facility] and was told shipment would arrive on 03/05/11.  Called and spoke with [facility] this morning and was informed they last shipped xolair to our office on 02/12/11 for a 1 month supply.  Ins denied the shipment that was scheduled for 03/05/11 as it was too soon.  And shipment has been rescheduled for 03/12/11.  [facility] verified that the shipment they sent on 02/12/11 was enough xolair to allow pt to get her usual injections every two weeks, so I'm unsure why she ran short.    Cy, please advise on your recs.  Pt states she needs to get injection this week as she will be unable to come to the office next week.

## 2011-03-08 ENCOUNTER — Ambulatory Visit (INDEPENDENT_AMBULATORY_CARE_PROVIDER_SITE_OTHER): Payer: 59

## 2011-03-08 DIAGNOSIS — J45909 Unspecified asthma, uncomplicated: Secondary | ICD-10-CM

## 2011-03-08 NOTE — Telephone Encounter (Signed)
I have looked into this matter; I found that the patient indeed has two bottles for this weeks injection in allergy lab. Pt will come by today around noon for her Xolair injections and understands that her pharmacy is shipping her Xolair on the same and exact schedule as should be. I apologized to the patient for any inconvenience this may have caused her. Pt was understanding. Tammy Scott in allergy is aware of the medication in allergy lab for patient.

## 2011-03-11 DIAGNOSIS — J45909 Unspecified asthma, uncomplicated: Secondary | ICD-10-CM

## 2011-03-11 MED ORDER — OMALIZUMAB 150 MG ~~LOC~~ SOLR
225.0000 mg | Freq: Once | SUBCUTANEOUS | Status: AC
  Administered 2011-03-11: 225 mg via SUBCUTANEOUS

## 2011-03-13 ENCOUNTER — Ambulatory Visit (INDEPENDENT_AMBULATORY_CARE_PROVIDER_SITE_OTHER): Payer: 59 | Admitting: Internal Medicine

## 2011-03-13 ENCOUNTER — Encounter: Payer: Self-pay | Admitting: Internal Medicine

## 2011-03-13 DIAGNOSIS — G4700 Insomnia, unspecified: Secondary | ICD-10-CM

## 2011-03-13 DIAGNOSIS — M549 Dorsalgia, unspecified: Secondary | ICD-10-CM

## 2011-03-13 DIAGNOSIS — K219 Gastro-esophageal reflux disease without esophagitis: Secondary | ICD-10-CM

## 2011-03-13 DIAGNOSIS — K5900 Constipation, unspecified: Secondary | ICD-10-CM

## 2011-03-13 MED ORDER — ZOLPIDEM TARTRATE 10 MG PO TABS: 10.0000 mg | ORAL_TABLET | Freq: Every evening | ORAL | Status: DC | PRN

## 2011-03-13 MED ORDER — RABEPRAZOLE SODIUM 20 MG PO TBEC: 20.0000 mg | DELAYED_RELEASE_TABLET | Freq: Every day | ORAL | Status: DC

## 2011-03-13 MED ORDER — DOCUSATE SODIUM 100 MG PO CAPS: 100.0000 mg | ORAL_CAPSULE | Freq: Two times a day (BID) | ORAL | Status: DC | PRN

## 2011-03-13 NOTE — Patient Instructions (Signed)
Back Pain, Adult Low back pain is very common. About 1 in 5 people have back pain.The cause of low back pain is rarely dangerous. The pain often gets better over time.About half of people with a sudden onset of back pain feel better in just 2 weeks. About 8 in 10 people feel better by 6 weeks.  CAUSES Some common causes of back pain include:  Strain of the muscles or ligaments supporting the spine.   Wear and tear (degeneration) of the spinal discs.   Arthritis.   Direct injury to the back.  DIAGNOSIS Most of the time, the direct cause of low back pain is not known.However, back pain can be treated effectively even when the exact cause of the pain is unknown.Answering your caregiver's questions about your overall health and symptoms is one of the most accurate ways to make sure the cause of your pain is not dangerous. If your caregiver needs more information, he or she may order lab work or imaging tests (X-rays or MRIs).However, even if imaging tests show changes in your back, this usually does not require surgery. HOME CARE INSTRUCTIONS For many people, back pain returns.Since low back pain is rarely dangerous, it is often a condition that people can learn to manageon their own.   Remain active. It is stressful on the back to sit or stand in one place. Do not sit, drive, or stand in one place for more than 30 minutes at a time. Take short walks on level surfaces as soon as pain allows.Try to increase the length of time you walk each day.   Do not stay in bed.Resting more than 1 or 2 days can delay your recovery.   Do not avoid exercise or work.Your body is made to move.It is not dangerous to be active, even though your back may hurt.Your back will likely heal faster if you return to being active before your pain is gone.   Pay attention to your body when you bend and lift. Many people have less discomfortwhen lifting if they bend their knees, keep the load close to their  bodies,and avoid twisting. Often, the most comfortable positions are those that put less stress on your recovering back.   Find a comfortable position to sleep. Use a firm mattress and lie on your side with your knees slightly bent. If you lie on your back, put a pillow under your knees.   Only take over-the-counter or prescription medicines as directed by your caregiver. Over-the-counter medicines to reduce pain and inflammation are often the most helpful.Your caregiver may prescribe muscle relaxant drugs.These medicines help dull your pain so you can more quickly return to your normal activities and healthy exercise.   Put ice on the injured area.   Put ice in a plastic bag.   Place a towel between your skin and the bag.   Leave the ice on for 15 to 20 minutes, 3 to 4 times a day for the first 2 to 3 days. After that, ice and heat may be alternated to reduce pain and spasms.   Ask your caregiver about trying back exercises and gentle massage. This may be of some benefit.   Avoid feeling anxious or stressed.Stress increases muscle tension and can worsen back pain.It is important to recognize when you are anxious or stressed and learn ways to manage it.Exercise is a great option.  SEEK MEDICAL CARE IF:  You have pain that is not relieved with rest or medicine.   You have   pain that does not improve in 1 week.   You have new symptoms.   You are generally not feeling well.  SEEK IMMEDIATE MEDICAL CARE IF:   You have pain that radiates from your back into your legs.   You develop new bowel or bladder control problems.   You have unusual weakness or numbness in your arms or legs.   You develop nausea or vomiting.   You develop abdominal pain.   You feel faint.  Document Released: 04/15/2005 Document Revised: 12/26/2010 Document Reviewed: 09/03/2010 [employer] Patient Information 2012 [employer]. 

## 2011-03-13 NOTE — Progress Notes (Signed)
  Subjective:    Patient ID: April [name], female    DOB: Jul 03, 1970, 40 y.o.   MRN: [id number]  HPI  Is a 40 year old female with past medical history significant for morbid obesity and chronic back pain.  Patient is here today as she is very concerned about her back pain. He missed and out of 10 all the time, may improve to 7/10 at times while sitting, no exacerbating factors and relieved by high doses of ibuprofen, no radiation to the legs. She has tried ibuprofen, meloxicam, tylenol, hydrocodone and oxycodone in the past without much relief. She is currently taking ibuprofen PM for help with sleeping and is unable to sleep due to pain.  Patient complains of reflux like symptoms which are not controlled by her current dose of protonix.  Patient complains of constipation. She has changed her diet to include leafy vegetables and drinking a lot of water but she is still constipated.  Review of Systems  Constitutional: Negative for fever, activity change and appetite change.  HENT: Negative for sore throat.   Respiratory: Negative for cough and shortness of breath.   Cardiovascular: Negative for chest pain and leg swelling.  Gastrointestinal: Negative for nausea, abdominal pain, diarrhea, constipation and abdominal distention.  Genitourinary: Negative for frequency, hematuria and difficulty urinating.  Neurological: Negative for dizziness and headaches.  Psychiatric/Behavioral: Negative for suicidal ideas and behavioral problems.       Objective:   Physical Exam  Constitutional: She is oriented to person, place, and time. She appears well-developed and well-nourished.       Morbidly obese  HENT:  Head: Normocephalic and atraumatic.  Eyes: Conjunctivae and EOM are normal. Pupils are equal, round, and reactive to light. No scleral icterus.  Neck: Normal range of motion. Neck supple. No JVD present. No thyromegaly present.  Cardiovascular: Normal rate, regular rhythm, normal heart sounds  and intact distal pulses.  Exam reveals no gallop and no friction rub.   No murmur heard. Pulmonary/Chest: Effort normal and breath sounds normal. No respiratory distress. She has no wheezes. She has no rales.  Abdominal: Soft. Bowel sounds are normal. She exhibits no distension and no mass. There is no tenderness. There is no rebound and no guarding.  Musculoskeletal: Normal range of motion. She exhibits no edema and no tenderness.  Lymphadenopathy:    She has no cervical adenopathy.  Neurological: She is alert and oriented to person, place, and time.  Psychiatric: She has a normal mood and affect. Her behavior is normal.          Assessment & Plan:

## 2011-03-13 NOTE — Assessment & Plan Note (Addendum)
Patient will be tried on a new medication sulindac. Refer to pain clinic Dr. Ricarda Frame for evaluation and management. In addition to management Dr. Wynn Banker is requested to send her for appropriate imaging study required for evaluation. Continue using heat pads and other conservative measures. 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 Will refer for evaluation for occupational/posture associated back pain. Patient is agreeable with the plan.

## 2011-03-14 DIAGNOSIS — F5101 Primary insomnia: Secondary | ICD-10-CM | POA: Insufficient documentation

## 2011-03-14 DIAGNOSIS — G4700 Insomnia, unspecified: Secondary | ICD-10-CM | POA: Insufficient documentation

## 2011-03-14 MED ORDER — SULINDAC 200 MG PO TABS: 200.0000 mg | ORAL_TABLET | Freq: Two times a day (BID) | ORAL | Status: DC

## 2011-03-14 NOTE — Progress Notes (Signed)
Addended by: Lars Mage on: 03/14/2011 06:55 AM   Modules accepted: Orders

## 2011-03-16 IMAGING — CR DG CHEST 2V
2 series · 2 of 2 positions shown · non-contrast
Comparison: November 30, 2008

CLINICAL DATA: Asthma, shortness of breath and wheezing

CHEST - 2 VIEW

[w chest pa]
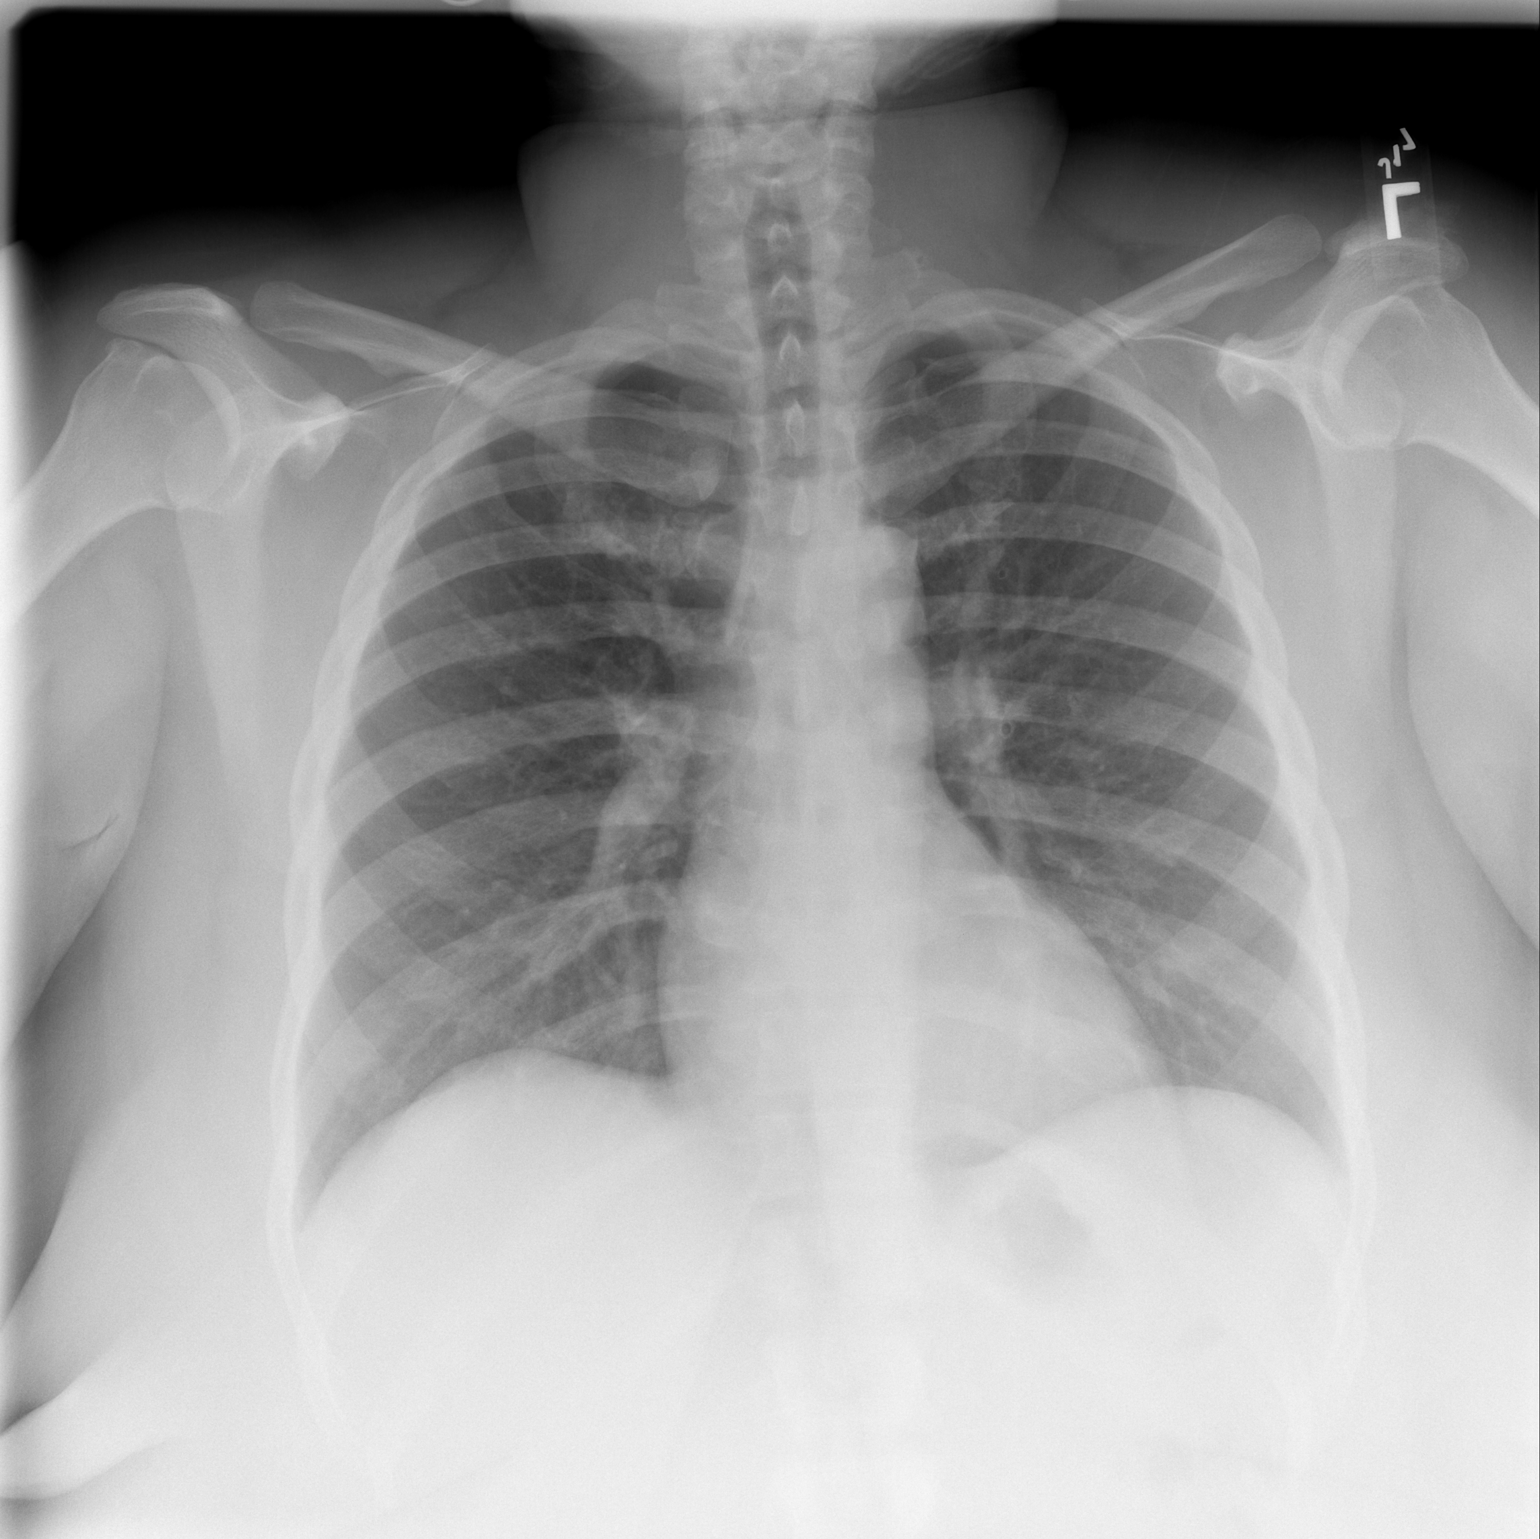

[w chest lat]
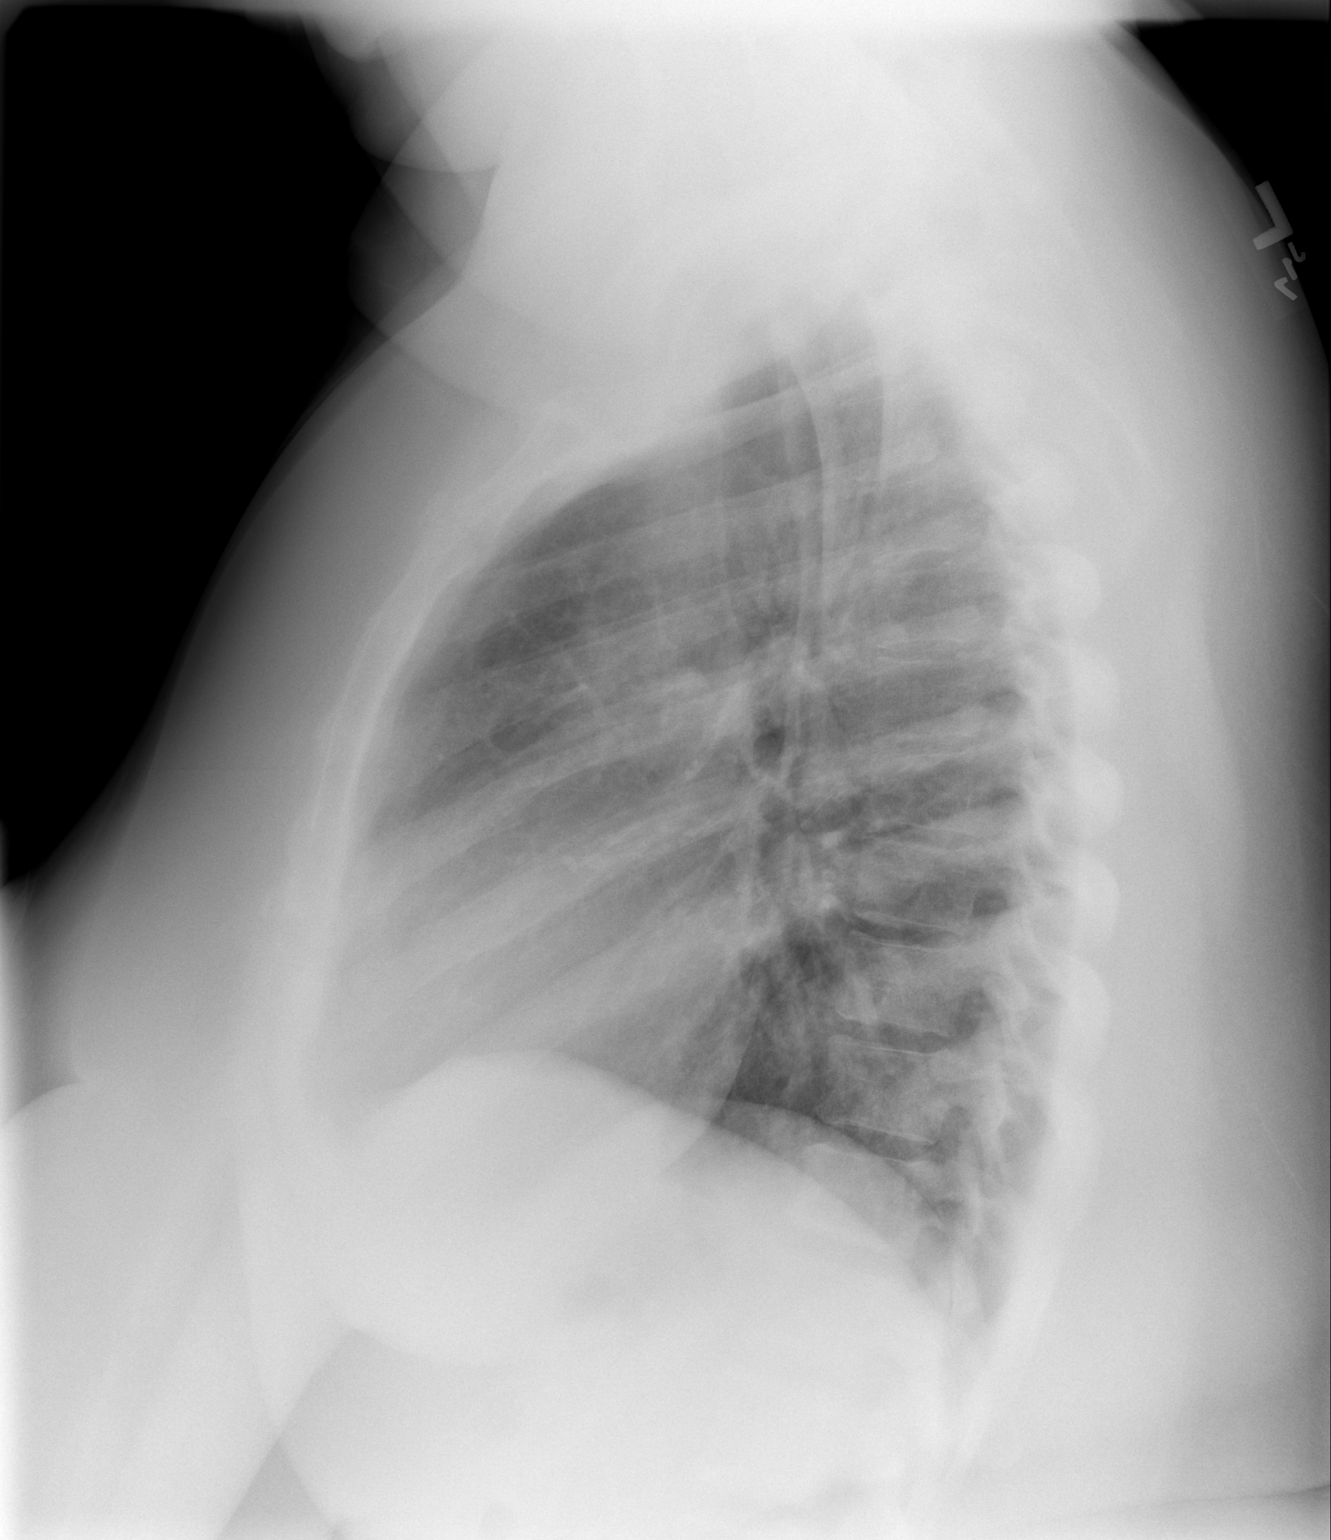

[2 of 2 positions shown; findings below may reference images not displayed]

FINDINGS: The cardiac silhouette, mediastinum, pulmonary
vasculature are within normal limits.  Both lungs are clear.
There is no acute bony abnormality.
IMPRESSION: There is no evidence of acute cardiac or pulmonary process.

## 2011-03-20 ENCOUNTER — Ambulatory Visit (INDEPENDENT_AMBULATORY_CARE_PROVIDER_SITE_OTHER): Payer: 59

## 2011-03-20 ENCOUNTER — Telehealth: Payer: Self-pay | Admitting: Internal Medicine

## 2011-03-20 DIAGNOSIS — J45909 Unspecified asthma, uncomplicated: Secondary | ICD-10-CM

## 2011-03-20 IMAGING — CR DG CHEST 2V
2 series · 2 of 2 positions shown · non-contrast
Comparison: None.

CLINICAL DATA: Asthma, shortness of breath and cough.

CHEST - 2 VIEW

[w chest pa]
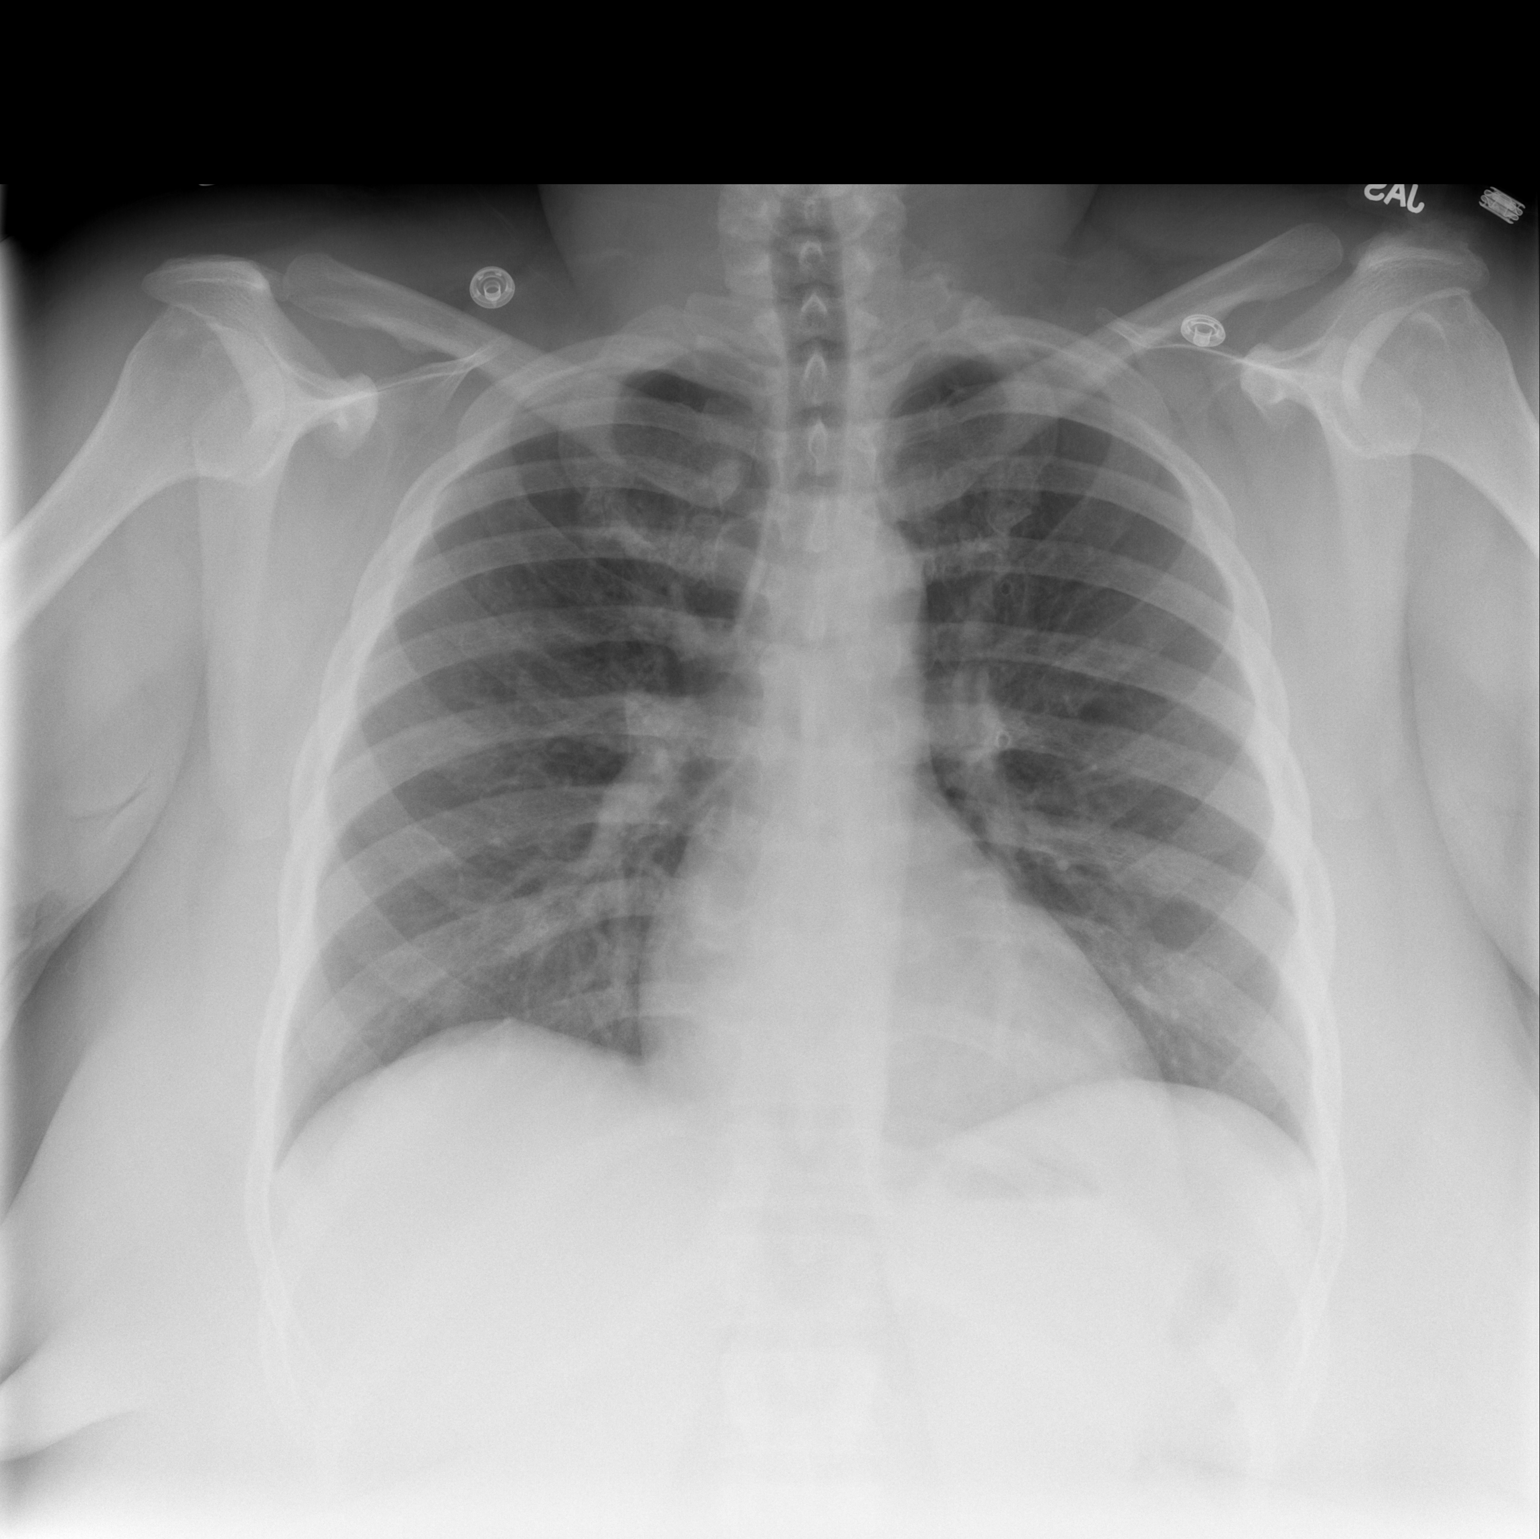

[w chest lat]
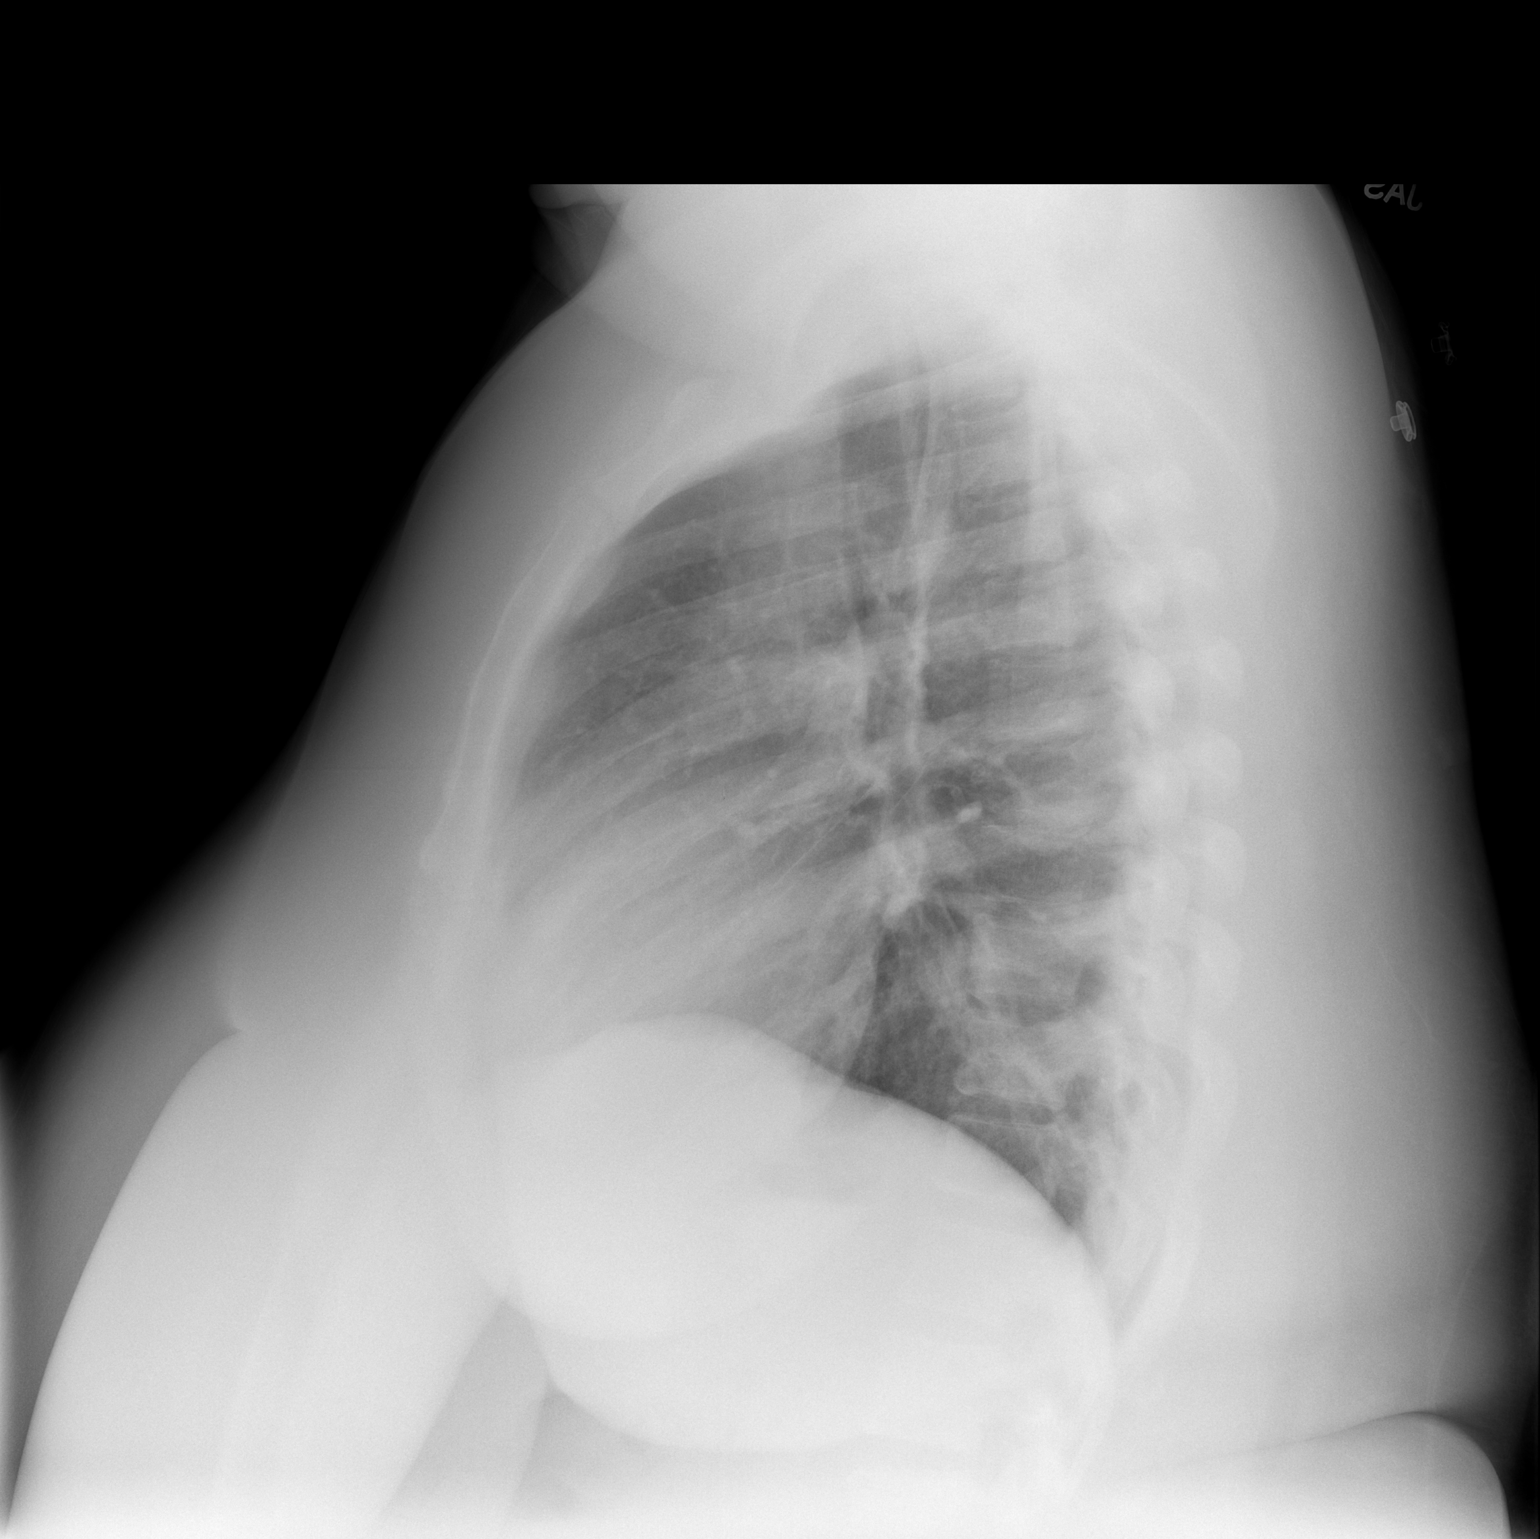

[2 of 2 positions shown; findings below may reference images not displayed]

FINDINGS: The lungs are clear and show no evidence of infiltrate,
edema, pleural effusion or pneumothorax.  Heart size and
mediastinal contours are within normal limits.

There is subjective increased density of bony structures of the
thorax including thoracic vertebral bodies.  This is nonspecific,
but does appear somewhat more prominent compared to chest x-rays
dating back to 1669.  Please correlate with any underlying history
of metabolic bone disease, systemic disease such as sickle cell
anemia, renal failure or other anemia/thalassemia.
IMPRESSION: No active disease.  Nonspecific increased bony density as above
suggestive of some type of underlying systemic disease.  Clinical
correlation suggested.

## 2011-03-20 MED ORDER — AZITHROMYCIN 250 MG PO TABS: ORAL_TABLET | ORAL | Status: AC

## 2011-03-20 NOTE — Telephone Encounter (Signed)
Called and spoke with pt and pt c/o---sore throat, cough with yellow sputum x 2-3 days and is worse today.  Has tried nyquil without relief.  CY please advise.   Pt is requesting abx to clear this up.  Thanks  Allergies  Allergen Reactions  . Ace Inhibitors     REACTION: Cough

## 2011-03-20 NOTE — Telephone Encounter (Signed)
Per CY---ok for pt to have zpak #1  Take as directed with no refills.  This has been sent to the pts pharmacy and pt is aware.

## 2011-03-22 DIAGNOSIS — J45909 Unspecified asthma, uncomplicated: Secondary | ICD-10-CM

## 2011-03-22 MED ORDER — OMALIZUMAB 150 MG ~~LOC~~ SOLR
225.0000 mg | Freq: Once | SUBCUTANEOUS | Status: AC
  Administered 2011-03-22: 225 mg via SUBCUTANEOUS

## 2011-03-29 ENCOUNTER — Telehealth: Payer: Self-pay | Admitting: Internal Medicine

## 2011-03-29 NOTE — Telephone Encounter (Signed)
I spoke with pt and is aware 1 sample of dulera was upfront for p/u. No samples of atrovent are available

## 2011-04-04 ENCOUNTER — Ambulatory Visit: Payer: 59

## 2011-04-05 ENCOUNTER — Ambulatory Visit (INDEPENDENT_AMBULATORY_CARE_PROVIDER_SITE_OTHER): Payer: 59

## 2011-04-05 DIAGNOSIS — J45909 Unspecified asthma, uncomplicated: Secondary | ICD-10-CM

## 2011-04-09 DIAGNOSIS — J45909 Unspecified asthma, uncomplicated: Secondary | ICD-10-CM

## 2011-04-09 MED ORDER — OMALIZUMAB 150 MG ~~LOC~~ SOLR
225.0000 mg | Freq: Once | SUBCUTANEOUS | Status: AC
  Administered 2011-04-09: 225 mg via SUBCUTANEOUS

## 2011-04-11 ENCOUNTER — Encounter: Payer: Self-pay | Admitting: Internal Medicine

## 2011-04-12 ENCOUNTER — Encounter: Payer: Self-pay | Admitting: Internal Medicine

## 2011-04-12 ENCOUNTER — Ambulatory Visit (INDEPENDENT_AMBULATORY_CARE_PROVIDER_SITE_OTHER): Payer: 59 | Admitting: Internal Medicine

## 2011-04-12 VITALS — BP 118/78 | HR 68 | Ht 66.0 in | Wt 313.0 lb

## 2011-04-12 DIAGNOSIS — G4700 Insomnia, unspecified: Secondary | ICD-10-CM

## 2011-04-12 DIAGNOSIS — G4730 Sleep apnea, unspecified: Secondary | ICD-10-CM

## 2011-04-12 DIAGNOSIS — G4733 Obstructive sleep apnea (adult) (pediatric): Secondary | ICD-10-CM

## 2011-04-12 DIAGNOSIS — F17200 Nicotine dependence, unspecified, uncomplicated: Secondary | ICD-10-CM

## 2011-04-12 DIAGNOSIS — J45909 Unspecified asthma, uncomplicated: Secondary | ICD-10-CM

## 2011-04-12 MED ORDER — ZOLPIDEM TARTRATE 10 MG PO TABS: 10.0000 mg | ORAL_TABLET | Freq: Every evening | ORAL | Status: DC | PRN

## 2011-04-12 MED ORDER — ALBUTEROL SULFATE (2.5 MG/3ML) 0.083% IN NEBU: 2.5000 mg | INHALATION_SOLUTION | RESPIRATORY_TRACT | Status: DC | PRN

## 2011-04-12 NOTE — Patient Instructions (Signed)
Refill script- nebulizer solution                   - ambien  Order- schedule split protocol NPSG   For dx OSA

## 2011-04-12 NOTE — Progress Notes (Signed)
Patient ID: April [name], female    DOB: 1970/07/27, 40 y.o.   MRN: [id number]  HPI 5 yoF former smoker followed for chronic severe asthma. Hospitalized 4/11-12/12 for another exacerbation she says was triggered by catching a cold. Common triggers include colds and any smoke exposure. Back now to baseline. She has continued Xolair injection here every 2 weeks and does feel it has reduced frequency of flare-ups. Usual weight range 320-340 and she asks why weight is up to 360. Discussed steroids, including IV therapy at hospital but also ?role of Dulera 200-5. She is now being seen a [facility] anticipating breast reduction surgery and we talked about bariatric surgery. Takes omeprazole twice daily before meals, but has frequent heart burn.   04/12/11- 19 yoF former smoker followed for chronic severe asthma. Has had flu vaccine and pertussis vaccine. Says her breathing is okay. We had sent antibiotic for a cold with bronchitis around Thanksgiving. She denies any smoking at all now. She has avoided hospital partly because she has been better and partly because she is unwilling to go. Xolair has made a real improvement. She had breast reduction surgery without problems at [facility] and has considered bariatric surgery but can't afford it. Uses her nebulizer machine but not every day. Does continue Dulera and omeprazole. She is taking Ambien but still sleeps poorly at night. She no longer misses much work but that means she can't nap in the daytime. Complains of frequently waking at night but does not know if she snores.  Review of Systems See HPI Constitutional:   No-   weight loss, night sweats, fevers, chills, +fatigue, lassitude. HEENT:   No-  headaches, difficulty swallowing, tooth/dental problems, sore throat,       No-  sneezing, itching, ear ache, nasal congestion, post nasal drip,  CV:  No-   chest pain, orthopnea, PND, swelling in lower extremities, anasarca,                                   dizziness, palpitations Resp: No-acute   shortness of breath with exertion or at rest.              No-   productive cough,  No non-productive cough,  No- coughing up of blood.              No-   change in color of mucus.  No- wheezing.   Skin: No-   rash or lesions. GI:  No-   heartburn, indigestion, abdominal pain, nausea, vomiting, diarrhea,                 change in bowel habits, loss of appetite GU: MS:  No-   joint pain or swelling.  No- decreased range of motion.  No- back pain. Neuro-     nothing unusual Psych:  No- change in mood or affect. No depression or anxiety.  No memory loss.      Objective:   Physical Exam General- Alert, Oriented, Affect-appropriate, Distress- none acute, very obese Skin- rash-none, lesions- none, excoriation- none Lymphadenopathy- none Head- atraumatic            Eyes- Gross vision intact, PERRLA, conjunctivae clear secretions            Ears- Hearing, canals-normal            Nose- Clear, no-Septal dev, mucus, polyps, erosion, perforation  Throat- Mallampati II , mucosa clear , drainage- none, tonsils- atrophic Neck- flexible , trachea midline, no stridor , thyroid nl, carotid no bruit Chest - symmetrical excursion , unlabored           Heart/CV- RRR , no murmur , no gallop  , no rub, nl s1 s2                           - JVD- none , edema- none, stasis changes- none, varices- none           Lung- clear to P&A, wheeze- none, cough- none , dullness-none, rub- none           Chest wall-  Abd- tender-no, distended-no, bowel sounds-present, HSM- no Br/ Gen/ Rectal- Not done, not indicated Extrem- cyanosis- none, clubbing, none, atrophy- none, strength- nl Neuro- grossly intact to observation

## 2011-04-14 NOTE — Assessment & Plan Note (Signed)
Has used Ambien as a sleep aid. We discussed sleep hygiene. May need reassessment for sleep apnea as a reason why she is waking.

## 2011-04-14 NOTE — Assessment & Plan Note (Signed)
She is convinced that Xolair and Dulera help. Her control has been better as confirmed by much improved attendance at work.

## 2011-04-14 NOTE — Assessment & Plan Note (Signed)
She is ready to try again with the sleep study

## 2011-04-14 NOTE — Assessment & Plan Note (Signed)
She assures me that she is not smoking at all.

## 2011-04-16 ENCOUNTER — Ambulatory Visit (INDEPENDENT_AMBULATORY_CARE_PROVIDER_SITE_OTHER): Payer: 59 | Admitting: Internal Medicine

## 2011-04-16 ENCOUNTER — Encounter: Payer: Self-pay | Admitting: *Deleted

## 2011-04-16 ENCOUNTER — Ambulatory Visit (INDEPENDENT_AMBULATORY_CARE_PROVIDER_SITE_OTHER): Payer: 59

## 2011-04-16 VITALS — BP 138/79 | HR 90 | Temp 97.3°F | Wt 318.6 lb

## 2011-04-16 DIAGNOSIS — R000 Tachycardia, unspecified: Secondary | ICD-10-CM

## 2011-04-16 DIAGNOSIS — F329 Major depressive disorder, single episode, unspecified: Secondary | ICD-10-CM

## 2011-04-16 DIAGNOSIS — J45909 Unspecified asthma, uncomplicated: Secondary | ICD-10-CM

## 2011-04-16 DIAGNOSIS — Z139 Encounter for screening, unspecified: Secondary | ICD-10-CM

## 2011-04-16 LAB — T4, FREE: Free T4: 0.98 ng/dL (ref 0.80–1.80)

## 2011-04-16 MED ORDER — ALPRAZOLAM ER 1 MG PO TB24: 1.0000 mg | ORAL_TABLET | ORAL | Status: DC

## 2011-04-16 NOTE — Patient Instructions (Signed)
Will get labs today and I will call you with the results You can take Xanax 1mg  one tablet daily  Follow up with Dr. Anselm Jungling in 2 weeks

## 2011-04-16 NOTE — Assessment & Plan Note (Addendum)
Most of her symptoms could be related to thyroid problem. Her insomnia may be related to thyroid disease as well.  Will check a TSH and free T4 for initial evaluation. If those tests come back abnormal, I will order antibodies testing for further evaluation. In the meantime, patient is very anxious and depressed and would like some medication to help with her symptoms. -Will prescribe a short course of Xanax 1 mg extended release by mouth daily #15 -I will call patient back with lab results

## 2011-04-16 NOTE — Assessment & Plan Note (Signed)
Stable. No wheezing on physical exam, no respiratory distress, and can speak in complete sentences. I do not think that she needs steroid  at this time. -Continue current inhalers and nebulizers -Patient is being followed by pulmonologist-Dr. Maple Hudson

## 2011-04-16 NOTE — Progress Notes (Signed)
HPI: April [name] is here today for depression x 1 month in duration.  She is not able to sleep at night, Ambien helps but she has to take 3 tablets to make her sleep at night.  She has been eating more and gain a few pounds but lost a few as well (fluctuation in her weight).  She has problem with concentration.  She is feeling sad, but denies any suicidal ideation.  She does have some visual hallucination outside of her window but she does not think that she is crazy and denies any auditory hallucination.  She is going through a lot of stress with her children, work, with heart palpitation from anxiety.  She has heat intolerance, hair loss, constipation (one bowel movement per week) and tried Dulcolax.  She denies any problem with swallowing. She tried her friend's medication: 1 tablet of Xanax which helped her relax and she wants to try some in the mean time while we wait for thyroid test. She denies any family history of thyroid disease or any other autoimmune diseases. She had an intermittent cough with productive sputum back in November and has been using her inhalers on an as-needed basis. She denies any wheezing or shortness of breath at this time. She denies any fever or chills or any other systemic symptoms.  ROS: as per HPI  PE: General: alert, well-developed, and cooperative to examination.  Neck: supple, full ROM, no thyromegaly, no JVD.  No thyroid nodules were appreciated on palpation  Lungs: normal respiratory effort, no accessory muscle use, normal breath sounds, no crackles, and no wheezes. Heart: Slightly tachycardia, regular rhythm, no murmur, no gallop, and no rub.  Abdomen: soft, non-tender, normal bowel sounds, no distention, no guarding, no rebound tenderness Msk: no joint swelling, no joint warmth, and no redness over joints.  Mild tenderness to palpation of the lower back  Pulses: 2+ DP/PT pulses bilaterally Extremities: No cyanosis, clubbing, edema Neurologic: alert & oriented X3,  cranial nerves II-XII intact, strength normal in all extremities, sensation intact to light touch, and gait normal.  Skin: turgor normal and no rashes.  Psych: Oriented X3, memory intact for recent and remote, normally interactive, good eye contact, very anxious appearing, and +depressed appearing.

## 2011-04-17 DIAGNOSIS — J45909 Unspecified asthma, uncomplicated: Secondary | ICD-10-CM

## 2011-04-17 LAB — HIV ANTIBODY (ROUTINE TESTING W REFLEX): HIV: NONREACTIVE

## 2011-04-17 MED ORDER — OMALIZUMAB 150 MG ~~LOC~~ SOLR
225.0000 mg | Freq: Once | SUBCUTANEOUS | Status: AC
  Administered 2011-04-17: 225 mg via SUBCUTANEOUS

## 2011-04-18 MED ORDER — BUPROPION HCL ER (XL) 150 MG PO TB24: 150.0000 mg | ORAL_TABLET | Freq: Every day | ORAL | Status: DC

## 2011-04-18 NOTE — Progress Notes (Signed)
Addended by: Carrolyn Meiers T on: 04/18/2011 12:05 PM   Modules accepted: Orders

## 2011-04-19 ENCOUNTER — Other Ambulatory Visit: Payer: Self-pay | Admitting: Allergy

## 2011-04-24 ENCOUNTER — Telehealth: Payer: Self-pay | Admitting: Internal Medicine

## 2011-04-24 NOTE — Telephone Encounter (Signed)
I spoke with pt and she states her asthma is acting up and requesting an apt today or tomorrow. I advised pt nothing available today but cdy could see her tomorrow at 10:30. Pt states that was fine. I advised pt if her worsens then she needed to go to UC or the ED. She voiced her understanding and needed nothing further

## 2011-04-25 ENCOUNTER — Ambulatory Visit (INDEPENDENT_AMBULATORY_CARE_PROVIDER_SITE_OTHER): Payer: 59 | Admitting: Internal Medicine

## 2011-04-25 ENCOUNTER — Encounter: Payer: Self-pay | Admitting: Internal Medicine

## 2011-04-25 VITALS — BP 118/64 | HR 75 | Ht 66.0 in | Wt 321.0 lb

## 2011-04-25 VITALS — BP 137/85 | HR 88 | Temp 98.0°F | Ht 67.0 in | Wt 320.8 lb

## 2011-04-25 DIAGNOSIS — M79606 Pain in leg, unspecified: Secondary | ICD-10-CM

## 2011-04-25 DIAGNOSIS — G4700 Insomnia, unspecified: Secondary | ICD-10-CM

## 2011-04-25 DIAGNOSIS — J45909 Unspecified asthma, uncomplicated: Secondary | ICD-10-CM

## 2011-04-25 DIAGNOSIS — M79609 Pain in unspecified limb: Secondary | ICD-10-CM

## 2011-04-25 DIAGNOSIS — F17200 Nicotine dependence, unspecified, uncomplicated: Secondary | ICD-10-CM

## 2011-04-25 DIAGNOSIS — M25562 Pain in left knee: Secondary | ICD-10-CM | POA: Insufficient documentation

## 2011-04-25 DIAGNOSIS — F329 Major depressive disorder, single episode, unspecified: Secondary | ICD-10-CM

## 2011-04-25 DIAGNOSIS — M25569 Pain in unspecified knee: Secondary | ICD-10-CM

## 2011-04-25 DIAGNOSIS — M25561 Pain in right knee: Secondary | ICD-10-CM

## 2011-04-25 LAB — BASIC METABOLIC PANEL WITH GFR
BUN: 8 mg/dL (ref 6–23)
Calcium: 8.6 mg/dL (ref 8.4–10.5)
Chloride: 106 mEq/L (ref 96–112)
Creat: 0.68 mg/dL (ref 0.50–1.10)
GFR, Est African American: >89 mL/min
GFR, Est Non African American: >89 mL/min

## 2011-04-25 MED ORDER — ALPRAZOLAM ER 2 MG PO TB24: 2.0000 mg | ORAL_TABLET | ORAL | Status: AC

## 2011-04-25 MED ORDER — METHOCARBAMOL 500 MG PO TABS: 500.0000 mg | ORAL_TABLET | Freq: Four times a day (QID) | ORAL | Status: AC

## 2011-04-25 NOTE — Assessment & Plan Note (Signed)
Stable. Lung exam only showed very mild expiratory wheezes. Patient is already being managed by Dr. Maple Hudson. I do not think that she needs an oral steroid at this time. Will continue her current medication regimen.

## 2011-04-25 NOTE — Assessment & Plan Note (Addendum)
Patient continues to have insomnia. She reports that she sometimes has to take 3 or 4 tablet of Ambien 10mg  at night in order to help her sleep even though Ambien does give her visual hallucinations. I discussed good sleep hygiene with patient and advised her to start reading a book at bedtime to help her fall sleep better. She should also avoid TV, caffeine or heavy exercise around bed time. Insomnia maybe secondary to her depression as well; therefore, once her depression is better controlled, her insomnia may improve.

## 2011-04-25 NOTE — Progress Notes (Signed)
Patient ID: April [name], female    DOB: 1970/07/15, 40 y.o.   MRN: [id number]  HPI 5 yoF former smoker followed for chronic severe asthma. Hospitalized 4/11-12/12 for another exacerbation she says was triggered by catching a cold. Common triggers include colds and any smoke exposure. Back now to baseline. She has continued Xolair injection here every 2 weeks and does feel it has reduced frequency of flare-ups. Usual weight range 320-340 and she asks why weight is up to 360. Discussed steroids, including IV therapy at hospital but also ?role of Dulera 200-5. She is now being seen a [facility] anticipating breast reduction surgery and we talked about bariatric surgery. Takes omeprazole twice daily before meals, but has frequent heart burn.   04/12/11- 31 yoF former smoker followed for chronic severe asthma. Has had flu vaccine and pertussis vaccine. Says her breathing is okay. We had sent antibiotic for a cold with bronchitis around Thanksgiving. She denies any smoking at all now. She has avoided hospital partly because she has been better and partly because she is unwilling to go. Xolair has made a real improvement. She had breast reduction surgery without problems at [facility] and has considered bariatric surgery but can't afford it. Uses her nebulizer machine but not every day. Does continue Dulera and omeprazole. She is taking Ambien but still sleeps poorly at night. She no longer misses much work but that means she can't nap in the daytime. Complains of frequently waking at night but does not know if she snores.  04/25/11-  89 yoF former smoker followed for chronic severe asthma complicated by morbid obesity, HBP, depression. Her antidepressant was changed to Wellbutrin on December 18. Since then she can't sleep, using 3 Ambien per night. Increased asthma with wheezing. She increased to [facility] to 6 puffs daily. Had a cold at Thanksgiving but not since. She continues Xolair.  Review of Systems See  HPI Constitutional:   No-   weight loss, night sweats, fevers, chills, +fatigue, lassitude. HEENT:   No-  headaches, difficulty swallowing, tooth/dental problems, sore throat,       No-  sneezing, itching, ear ache, nasal congestion, post nasal drip,  CV:  No-   chest pain, orthopnea, PND, swelling in lower extremities, anasarca, dizziness, palpitations Resp: No-acute shortness of breath with exertion or at rest.              No-   productive cough,  No non-productive cough,  No- coughing up of blood.              No-   change in color of mucus.  increased wheezing.   Skin: No-   rash or lesions. GI:  No-   heartburn, indigestion, abdominal pain, nausea, vomiting, diarrhea,                 change in bowel habits, loss of appetite GU: MS:  No-   joint pain or swelling.  No- decreased range of motion.  No- back pain. Neuro-     nothing unusual Psych:  No- change in mood or affect. No depression or anxiety.  No memory loss.      Objective:   Physical Exam General- Alert, Oriented, Affect-appropriate, Distress- none acute, very obese, looks tired Skin- rash-none, lesions- none, excoriation- none Lymphadenopathy- none Head- atraumatic            Eyes- Gross vision intact, PERRLA, conjunctivae clear secretions            Ears-  Hearing, canals-normal            Nose- Clear, no-Septal dev, mucus, polyps, erosion, perforation             Throat- Mallampati II , mucosa clear , drainage- none, tonsils- atrophic Neck- flexible , trachea midline, no stridor , thyroid nl, carotid no bruit Chest - symmetrical excursion , unlabored           Heart/CV- RRR , no murmur , no gallop  , no rub, nl s1 s2                           - JVD- none , edema- none, stasis changes- none, varices- none           Lung- Diminished, unlabored. No wheeze, but had a raspy cough. , dullness-none, rub- none           Chest wall-  Abd- tender-no, distended-no, bowel sounds-present, HSM- no Br/ Gen/ Rectal- Not done, not  indicated Extrem- cyanosis- none, clubbing, none, atrophy- none, strength- nl Neuro- grossly intact to observation

## 2011-04-25 NOTE — Patient Instructions (Addendum)
Please get in touch with your regular doctor about the tiredness and difficulty sleeping that you notice since the change to Welbutrin.  Don't use more Dulera than 2 puffs and rinse mouth, twice daily  Don't  Take more than 1 Ambien for sleep at night- it will stack up and carry over to leave you feeling very tired in the daytime.  Keep return appointment to se me in February and the scheduled sleep study in January  Neb xop 0.63  Depo 80

## 2011-04-25 NOTE — Assessment & Plan Note (Signed)
Not improving. However patient has only been taking her Wellbutrin in the past one week. She denies any increase in depression or suicidal ideation, or psychosis. Again advised patient that this medication will take up to 6-8 weeks for it to be completely effective for her to see a difference.  Her biggest concern is weight gain which is why she is very reluctant to take antidepressant .  With Wellbutrin, patient should have weight loss instead of the weight gain. Patient did state that the Xanax does help calm her down and that she has been taking 2 mg by mouth daily -Continue Wellbutrin as prescribed -Change Xanax to 2 mg by mouth daily -I will followup with April [name] in 2-3 weeks

## 2011-04-25 NOTE — Progress Notes (Signed)
HPI: Ms. Dakotah is a 40 yo woman with PMH of asthma, depression, insomnia, HTN presents today for follow up on depression.  She states that she has not seen any difference in her depression since starting Wellbutrin (about 1 wk) but denies any increase in depression or suicidal ideation. She also states that she had some increase in shortness of breath therefore she came to see Dr. Bobbye Charleston today and was given a steroid shot which really helped. She denies any productive sputum, fever, chills, chest pain. She still has a migraine headache with aura: seeing white stars.   She also has bilateral leg pain in the past few days.  She does admit to having arthritis on her left knee.   ROS: as per HPI  PE:  General: alert, well-developed, and cooperative to examination.  Neck: supple, full ROM, no thyromegaly, no JVD, and no carotid bruits.  Lungs: normal respiratory effort, no accessory muscle use, normal breath sounds, no crackles, and very mild wheezing on expiration. Heart: normal rate, regular rhythm, no murmur, no gallop, and no rub.  Abdomen: soft, non-tender, normal bowel sounds, no distention, no guarding, no rebound tenderness Msk: mild joint swelling bilateral knees, tender to palpation from knee to ankle bilaterally (anteriorly and posteriorly), negative McMurray test, full range of motion, no erythema or increased in warmth Pulses: 2+ DP/PT pulses bilaterally Extremities: No cyanosis, clubbing, edema Neurologic: alert & oriented X3, cranial nerves II-XII intact, strength normal in all extremities, sensation intact to light touch, and gait normal.   Psych: Oriented X3, memory intact for recent and remote, normally interactive, good eye contact,+ anxious appearing, and + depressed appearing.

## 2011-04-25 NOTE — Patient Instructions (Signed)
Start taking Robaxin 500mg  one tablet 4 times daily for your muscle spasm You can also apply ice/heating pads for pain relief Exercise to strengthen your leg muscles Do not bear too much weight on your legs Continue taking Welbutrin as prescribed Start reading before you go to bed to help with sleep Follow up with Dr. Anselm Jungling in 2-3 weeks

## 2011-04-25 NOTE — Assessment & Plan Note (Addendum)
Arthritis versus musculoskeletal or medication side effects (myalgia from Wellbutrin) or electrolyte imbalances. I think it is more likely musculoskeletal since patient does stay on her feet most of the time while at work. She does have tenderness to palpation of the anterior as well as posterior aspect of her legs bilaterally from the knee down to her ankle. -Will try a short course of Robaxin 500 mg by mouth 4 times a day x7 days. -Advised on exercise therapy at home to help relax her leg muscles -Patient may also ice and heating pad around the knee joints for alleviation -We discussed about weight loss which would help with her knee pain -Will check BMP -I will followup in 2-3 weeks

## 2011-04-26 NOTE — Progress Notes (Signed)
Pt contacted with results by Dr Anselm Jungling.April [name], April Mungo Cassady12/28/20123:27 PM

## 2011-04-28 NOTE — Assessment & Plan Note (Signed)
She assures me she has not smoked in months.

## 2011-04-28 NOTE — Assessment & Plan Note (Signed)
She agreed to contact her primary physician about her experience with Wellbutrin. I educated about Ambien, restricting to 1 daily.

## 2011-04-28 NOTE — Assessment & Plan Note (Signed)
She describes increased wheeze and does have a bronchitic quality to her cough. She has been overusing Dulera and I educated about that extensively today. Plan neb Xopenex, Depo-Medrol

## 2011-05-05 ENCOUNTER — Encounter (HOSPITAL_COMMUNITY): Payer: Self-pay | Admitting: Cardiology

## 2011-05-05 ENCOUNTER — Emergency Department (INDEPENDENT_AMBULATORY_CARE_PROVIDER_SITE_OTHER)
Admission: EM | Admit: 2011-05-05 | Discharge: 2011-05-05 | Disposition: A | Discharge status: Home / Self Care | Payer: 59 | Source: Home / Self Care | Attending: Emergency Medicine | Admitting: Emergency Medicine

## 2011-05-05 DIAGNOSIS — J45909 Unspecified asthma, uncomplicated: Secondary | ICD-10-CM

## 2011-05-05 MED ORDER — MOMETASONE FURO-FORMOTEROL FUM 100-5 MCG/ACT IN AERO: 2.0000 | INHALATION_SPRAY | Freq: Two times a day (BID) | RESPIRATORY_TRACT | Status: DC

## 2011-05-05 MED ORDER — DEXAMETHASONE 4 MG PO TABS: ORAL_TABLET | ORAL | Status: AC

## 2011-05-05 MED ORDER — ALBUTEROL SULFATE HFA 108 (90 BASE) MCG/ACT IN AERS: 1.0000 | INHALATION_SPRAY | Freq: Four times a day (QID) | RESPIRATORY_TRACT | Status: DC | PRN

## 2011-05-05 NOTE — ED Provider Notes (Signed)
History     CSN: [id number]  Arrival date & time 05/05/11  1134   First MD Initiated Contact with Patient 05/05/11 1201      Chief Complaint  Patient presents with  . Asthma    (Consider location/radiation/quality/duration/timing/severity/associated sxs/prior treatment) HPI Comments: Pt with h/o asthma with coughing, wheezing, SOB, chest tightness starting today. Achy CP after coughing. Using rescue inhaler multiple times/day w/o relief.  admitted to hospital for asthma, last admission "years ago.". No intubations. No recent steriod use. Asthma triggered by  change in weather / exposure to known kerosene fumes from space heater. States feels identical to previous asthma exacerbations. Has run out of her proventil and dulera inhaler as well. States has plenty of ipratropium and albuterol nebs at home.   Patient is a 41 y.o. female presenting with asthma. The history is provided by the patient.  Asthma This is a recurrent problem. The current episode started 3 to 5 hours ago. The problem occurs constantly. The problem has been gradually worsening. Associated symptoms include shortness of breath. Pertinent negatives include no chest pain, no abdominal pain and no headaches.    Past Medical History  Diagnosis Date  . Acanthosis nigricans   . Menorrhagia   . Asthma with exacerbation   . Hypertension, essential   . Pain, dental   . Insomnia   . Morbid obesity   . Helicobacter pylori (H. pylori) infection   . Sleep apnea   . COPD (chronic obstructive pulmonary disease)   . Asthma     skin test 02/01/09 pos hemorrhoids  . Depression   . GERD (gastroesophageal reflux disease)   . Right wrist pain   . Tobacco user   . Depression     Past Surgical History  Procedure Date  . Tubal ligation 1996    bilateral  . Breast reduction surgery 09/2010    Family History  Problem Relation Age of Onset  . Asthma Daughter   . Cancer Paternal Aunt   . Asthma Maternal Grandmother      History  Substance Use Topics  . Smoking status: Former Smoker    Types: Cigarettes    Quit date: 06/26/2007  . Smokeless tobacco: Never Used  . Alcohol Use: No    OB History    Grav Para Term Preterm Abortions TAB SAB Ect Mult Living                  Review of Systems  Constitutional: Negative for fever.  HENT: Negative.   Respiratory: Positive for shortness of breath and wheezing.   Cardiovascular: Negative for chest pain.  Gastrointestinal: Negative for nausea, vomiting and abdominal pain.  Neurological: Negative for headaches.    Allergies  Ace inhibitors  Home Medications   Current Outpatient Rx  Name Route Sig Dispense Refill  . ALPRAZOLAM ER 2 MG PO TB24 Oral Take 1 tablet (2 mg total) by mouth every morning. 30 tablet 0  . BUPROPION HCL ER (XL) 150 MG PO TB24 Oral Take 1 tablet (150 mg total) by mouth daily. 30 tablet 3  . HYDROCHLOROTHIAZIDE 50 MG PO TABS Oral Take 0.5 tablets (25 mg total) by mouth daily. 30 tablet 11  . METHOCARBAMOL 500 MG PO TABS Oral Take 1 tablet (500 mg total) by mouth 4 (four) times daily. 28 tablet 0  . MOMETASONE FURO-FORMOTEROL FUM 100-5 MCG/ACT IN AERO Inhalation Inhale 2 Act into the lungs 2 (two) times daily at 10 AM and 5 PM. Rinse mouth 1 Inhaler  prn  . OMALIZUMAB 150 MG [place] SOLR  300 mg every 14 (fourteen) days.      . OMEPRAZOLE 40 MG PO CPDR Oral Take 1 tablet by mouth Twice daily.    . SULINDAC 200 MG PO TABS Oral Take 1 tablet (200 mg total) by mouth 2 (two) times daily. 60 tablet 0  . VALSARTAN 160 MG PO TABS Oral Take 1 tablet (160 mg total) by mouth daily. 30 tablet 11  . ZOLPIDEM TARTRATE 10 MG PO TABS Oral Take 1 tablet (10 mg total) by mouth at bedtime as needed for sleep. 30 tablet 5  . ALBUTEROL SULFATE HFA 108 (90 BASE) MCG/ACT IN AERS Inhalation Inhale 1-2 puffs into the lungs every 6 (six) hours as needed for wheezing. 1 Inhaler 0  . DEXAMETHASONE 4 MG PO TABS  4 tabs po at once on day one, 4 tabs po at once on  day 2 8 tablet 0  . DOCUSATE SODIUM 100 MG PO CAPS Oral Take 1 capsule (100 mg total) by mouth 2 (two) times daily as needed for constipation. 60 capsule 0  . EPINEPHRINE 0.3 MG/0.3ML IJ DEVI Intramuscular Inject 0.3 mg into the muscle as needed. For severe allergic reaction     . LORATADINE 10 MG PO TABS Oral Take 10 mg by mouth daily.      . MOMETASONE FURO-FORMOTEROL FUM 100-5 MCG/ACT IN AERO Inhalation Inhale 2 puffs into the lungs 2 (two) times daily. 1 Inhaler 09    BP 147/62  Pulse 88  Temp(Src) 98.6 F (37 C) (Oral)  Resp 22  SpO2 100%  LMP 04/02/2011  Physical Exam  Nursing note and vitals reviewed. Constitutional: She is oriented to person, place, and time. She appears well-developed and well-nourished.  HENT:  Head: Normocephalic and atraumatic.  Eyes: Conjunctivae and EOM are normal. Pupils are equal, round, and reactive to light.  Neck: Normal range of motion.  Cardiovascular: Normal rate, regular rhythm, normal heart sounds and intact distal pulses.   No murmur heard. Pulmonary/Chest: Effort normal and breath sounds normal. No respiratory distress. She has no wheezes. She has no rales. She exhibits no tenderness.  Abdominal: Soft. Bowel sounds are normal. She exhibits no distension.  Musculoskeletal: Normal range of motion. She exhibits no edema and no tenderness.  Neurological: She is alert and oriented to person, place, and time.  Skin: Skin is warm and dry.  Psychiatric: She has a normal mood and affect. Her behavior is normal. Judgment and thought content normal.    ED Course  Procedures (including critical care time)  Labs Reviewed - No data to display No results found.   1. Asthma       MDM      Luiz Blare, MD 05/05/11 1246

## 2011-05-05 NOTE — ED Notes (Signed)
Pt reports asthma in acting up. Short of breath and wheezing started this morning. Slept very little last night. She is out of her inhaler-duleara. Taking neb treatments at home with little relief. Denies fever but has had chill and hot episodes.

## 2011-05-06 ENCOUNTER — Ambulatory Visit (INDEPENDENT_AMBULATORY_CARE_PROVIDER_SITE_OTHER): Payer: 59

## 2011-05-06 ENCOUNTER — Ambulatory Visit: Payer: 59

## 2011-05-06 DIAGNOSIS — J45909 Unspecified asthma, uncomplicated: Secondary | ICD-10-CM

## 2011-05-07 DIAGNOSIS — J45909 Unspecified asthma, uncomplicated: Secondary | ICD-10-CM

## 2011-05-07 MED ORDER — OMALIZUMAB 150 MG ~~LOC~~ SOLR
225.0000 mg | Freq: Once | SUBCUTANEOUS | Status: AC
  Administered 2011-05-07: 225 mg via SUBCUTANEOUS

## 2011-05-10 ENCOUNTER — Ambulatory Visit (HOSPITAL_BASED_OUTPATIENT_CLINIC_OR_DEPARTMENT_OTHER): Payer: 59

## 2011-05-13 ENCOUNTER — Ambulatory Visit: Payer: 59 | Admitting: Physical Medicine and Rehabilitation

## 2011-05-21 ENCOUNTER — Ambulatory Visit (INDEPENDENT_AMBULATORY_CARE_PROVIDER_SITE_OTHER): Payer: 59

## 2011-05-21 DIAGNOSIS — J45909 Unspecified asthma, uncomplicated: Secondary | ICD-10-CM

## 2011-05-22 DIAGNOSIS — J45909 Unspecified asthma, uncomplicated: Secondary | ICD-10-CM

## 2011-05-22 MED ORDER — OMALIZUMAB 150 MG ~~LOC~~ SOLR: 225.0000 mg | Freq: Once | SUBCUTANEOUS | Status: DC

## 2011-05-24 DIAGNOSIS — N939 Abnormal uterine and vaginal bleeding, unspecified: Secondary | ICD-10-CM

## 2011-05-24 DIAGNOSIS — N926 Irregular menstruation, unspecified: Secondary | ICD-10-CM

## 2011-05-25 IMAGING — MG MM DIGITAL SCREENING
8 series · 8 of 8 positions shown · non-contrast
Comparison: none

DG SCREEN MAMMOGRAM BILATERAL
Bilateral CC and MLO view(s) were taken.

DIGITAL SCREENING MAMMOGRAM WITH CAD:
The breast tissue is almost entirely fatty.  No masses or malignant type calcifications are 
identified.
Images were processed with CAD.

[R CC (1 of 2)]
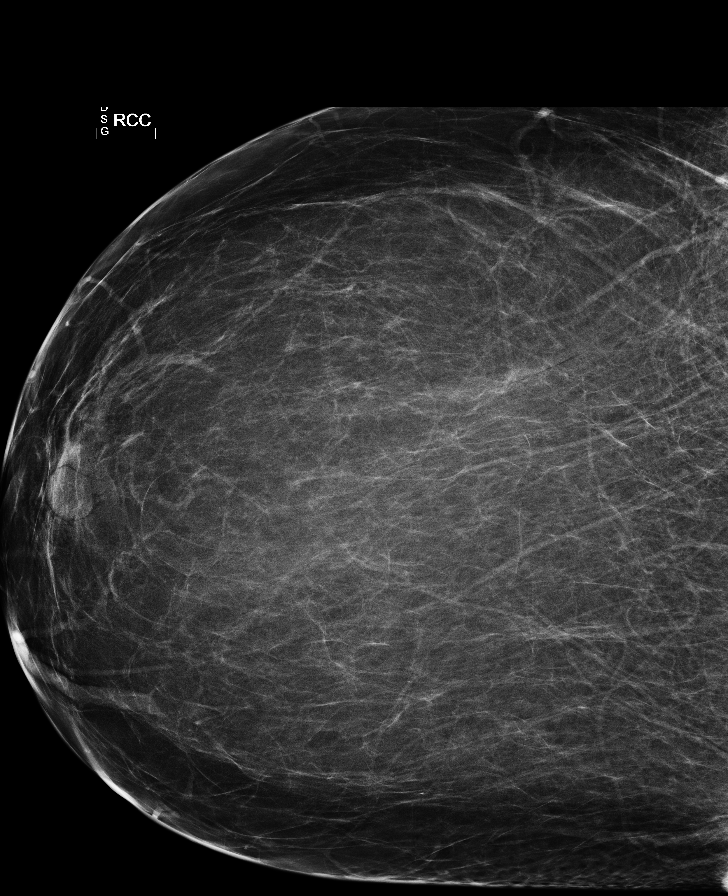

[R MLO (1 of 2)]
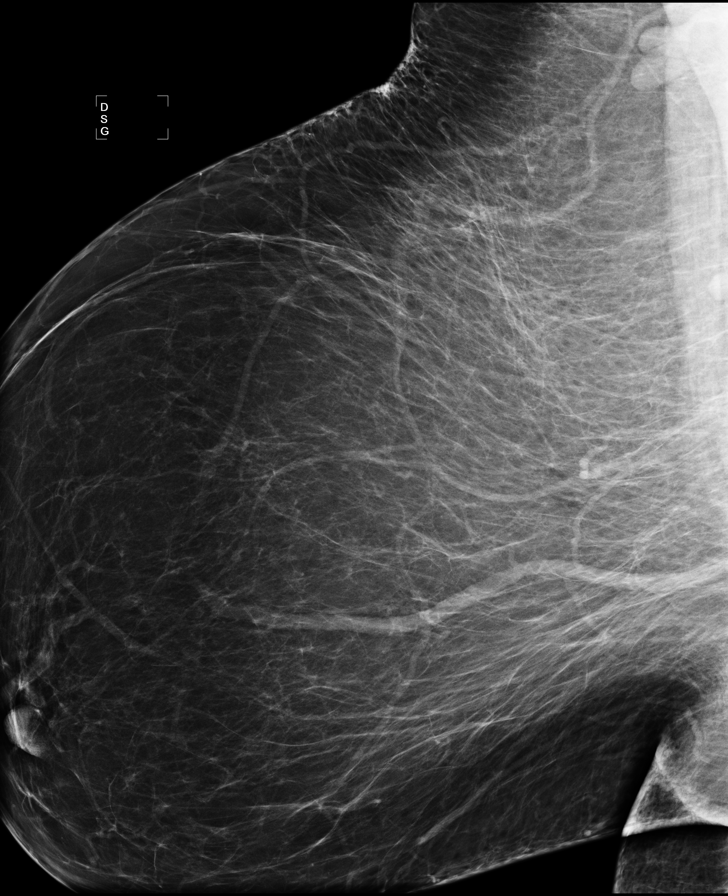

[L CC (1 of 2)]
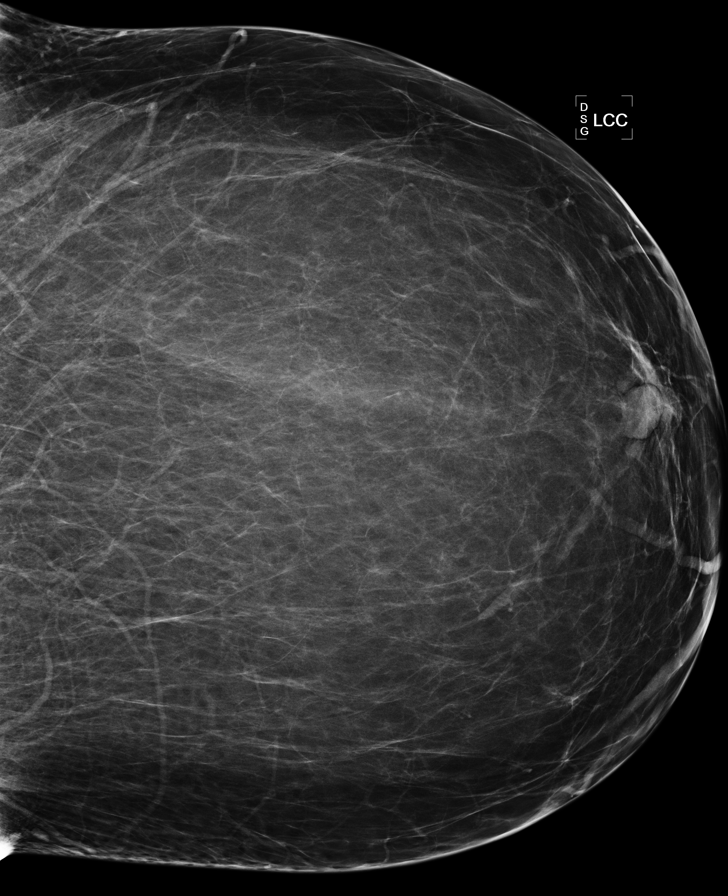

[L MLO (1 of 2)]
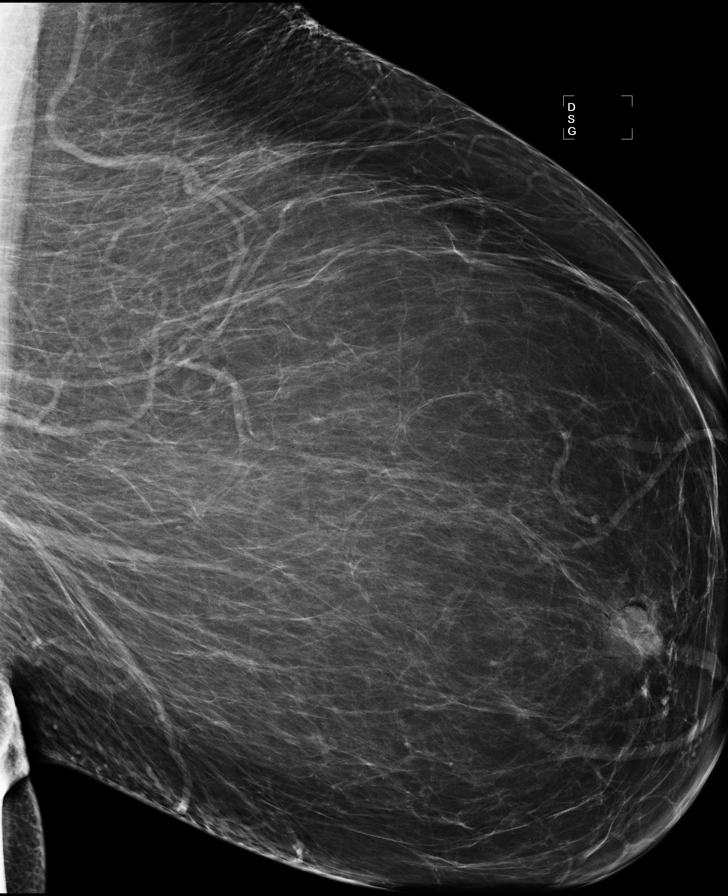

[L CC (2 of 2)]
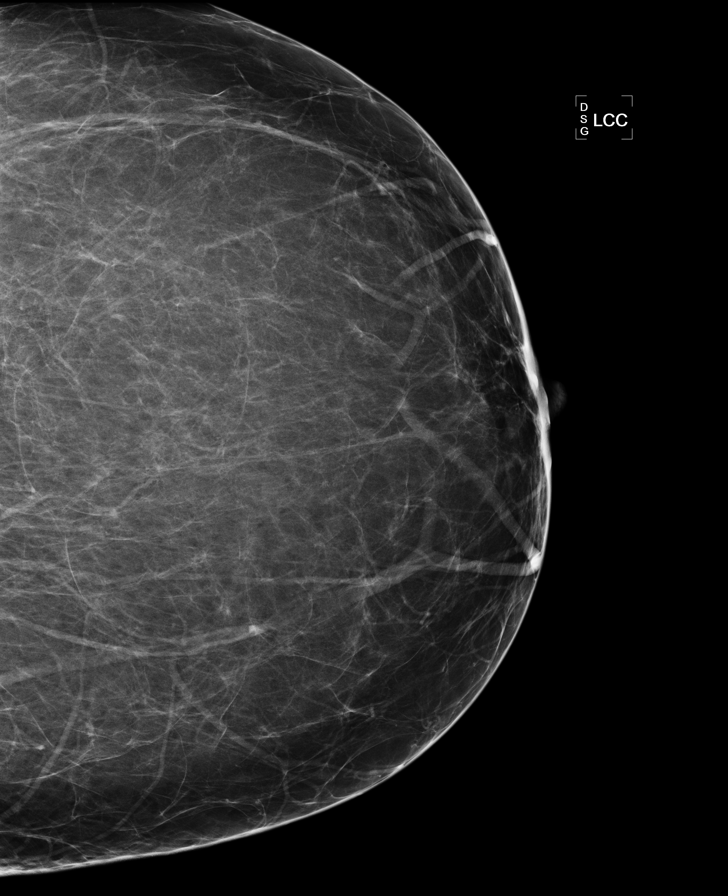

[R CC (2 of 2)]
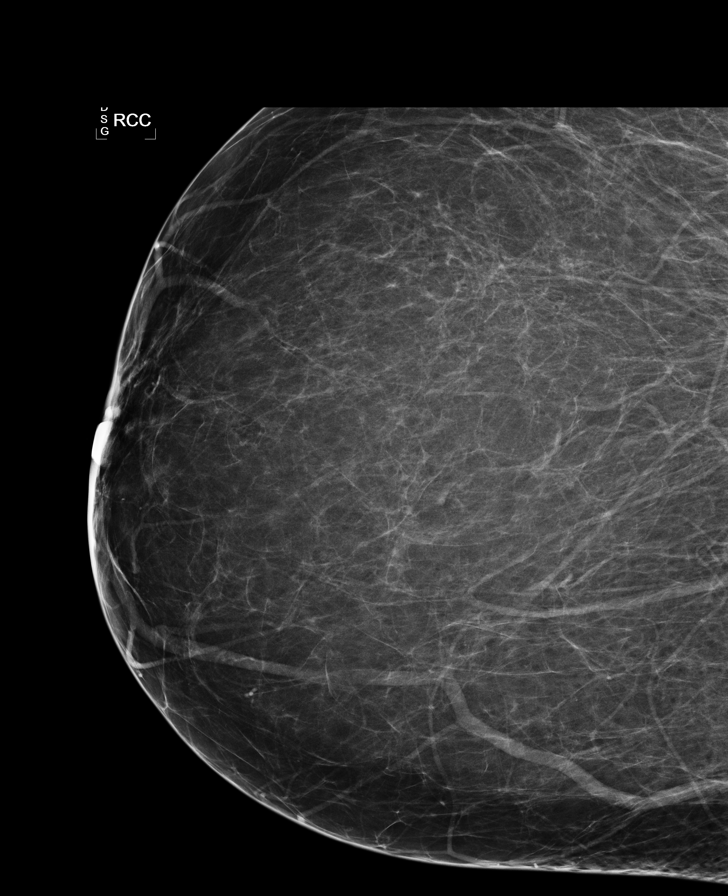

[L MLO (2 of 2)]
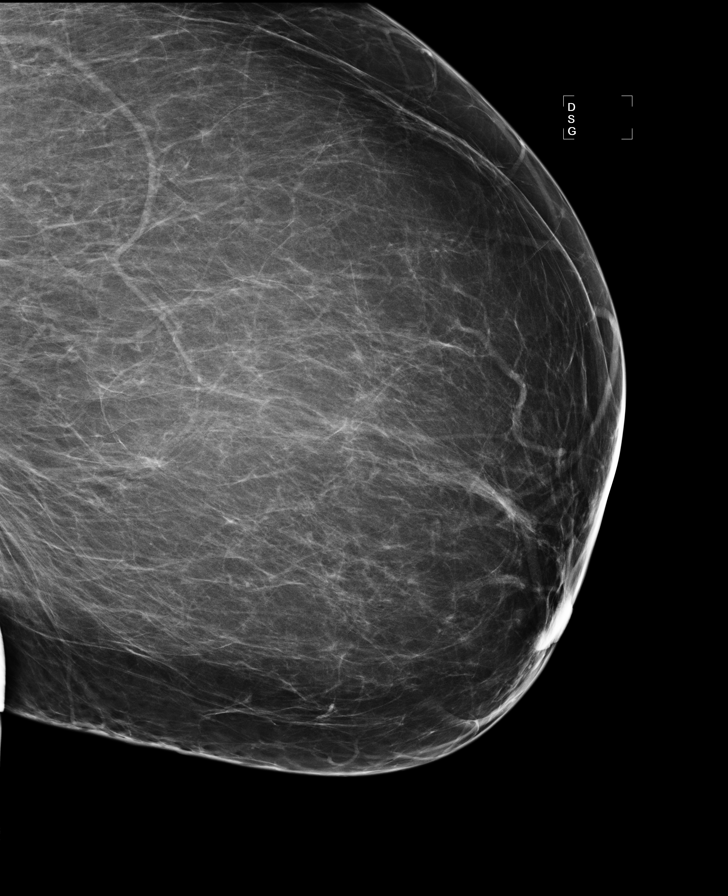

[R MLO (2 of 2)]
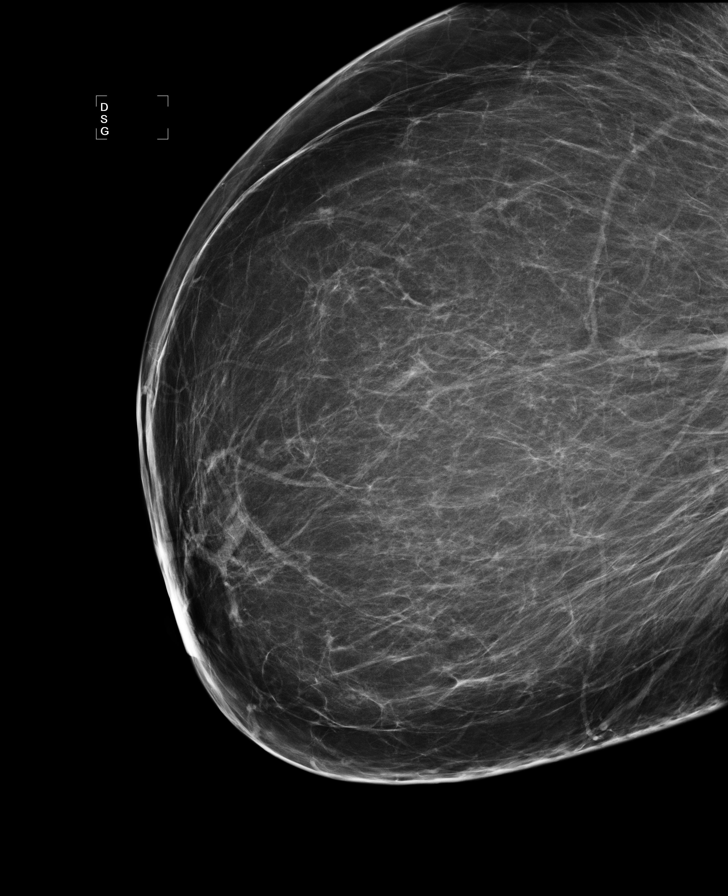

[8 of 8 positions shown; findings below may reference images not displayed]

IMPRESSION: No specific mammographic evidence of malignancy.  Next screening mammogram is recommended in one 
year.

A result letter of this screening mammogram will be mailed directly to the patient.

ASSESSMENT: Negative - BI-RADS 1

Screening mammogram in 1 year.
,

## 2011-05-28 ENCOUNTER — Ambulatory Visit (INDEPENDENT_AMBULATORY_CARE_PROVIDER_SITE_OTHER): Payer: 59 | Admitting: Internal Medicine

## 2011-05-28 ENCOUNTER — Encounter: Payer: Self-pay | Admitting: Internal Medicine

## 2011-05-28 ENCOUNTER — Other Ambulatory Visit: Payer: Self-pay | Admitting: *Deleted

## 2011-05-28 VITALS — BP 121/80 | HR 90 | Temp 98.6°F | Ht 67.0 in | Wt 300.9 lb

## 2011-05-28 DIAGNOSIS — N910 Primary amenorrhea: Secondary | ICD-10-CM

## 2011-05-28 DIAGNOSIS — N938 Other specified abnormal uterine and vaginal bleeding: Secondary | ICD-10-CM

## 2011-05-28 DIAGNOSIS — G4700 Insomnia, unspecified: Secondary | ICD-10-CM

## 2011-05-28 DIAGNOSIS — N949 Unspecified condition associated with female genital organs and menstrual cycle: Secondary | ICD-10-CM

## 2011-05-28 DIAGNOSIS — F329 Major depressive disorder, single episode, unspecified: Secondary | ICD-10-CM

## 2011-05-28 LAB — POCT URINE PREGNANCY: Preg Test, Ur: NEGATIVE

## 2011-05-28 LAB — HCG, QUANTITATIVE, PREGNANCY: hCG, Beta Chain, Quant, S: <1 m[IU]/mL

## 2011-05-28 NOTE — Progress Notes (Signed)
Patient ID: April [name], female   DOB: Jan 04, 1971, 41 y.o.   MRN: [id number]

## 2011-05-28 NOTE — Patient Instructions (Addendum)
Continue taking Wellbutrin(bupropion) 150 mg one tablet daily for your depression, this is the medication that I want you to continue to take for the next 6 months You may take Xanax 2 mg as needed for your anxiety Stop taking Ambien Will refer you to cognitive behavioral therapy for your insomnia/sleeping problem-please call V[facility] [phone], [address] Will get urine/blood test today and I will call you with lab results Follow up with Dr. Anselm Jungling in 3-4 months

## 2011-05-28 NOTE — Assessment & Plan Note (Signed)
Improving. She denies any depression, suicidal ideation. -Will continue current medication: Wellbutrin for the next 6 months and will consider stopping once patient is completely over her depression. -She may take Xanax when necessary anxiety

## 2011-05-28 NOTE — Assessment & Plan Note (Signed)
Not well-controlled.  -Stop Ambien -Will refer her to [facility] for cognitive behavioral therapy in addition to continue her antidepressant Wellbutrin. -Continue practice good sleep hygiene

## 2011-05-28 NOTE — Progress Notes (Signed)
HPI: April [name] is doing much better since last office visit. She reports that her depression and anxiety are improved with Xanax and Wellbutrin.  She denies any SI/HI or depression.  She has been loosing weight intentionally about 20 pounds since last office visit by exercising with her daughter.  She is somewhat confused with which medications she needs to take because she is taking too many pills.  She also reports being "late" on her period for about 1 month now and would like to get pregnancy test.  She still has insomnia in which she only sleeps 4-5 hours daily and that Ambien is making her hallucinates more so she stopped taking it.  She is interested in attending behavioral cognitive therapy to help with her insomnia.  ROS: as per HPI  PE: General: alert, well-developed, and cooperative to examination.   Lungs: normal respiratory effort, no accessory muscle use, normal breath sounds, no crackles, and no wheezes. Heart: normal rate, regular rhythm, no murmur, no gallop, and no rub.  Abdomen: soft, non-tender, normal bowel sounds, no distention, no guarding, no rebound tenderness Neurologic: alert & oriented X3, cranial nerves II-XII intact, strength normal in all extremities, sensation intact to light touch, and gait normal.  Skin: turgor normal and no rashes.  Psych: slightly anxious but no depression

## 2011-05-28 NOTE — Assessment & Plan Note (Signed)
Get urine and serum pregnancy test today

## 2011-06-07 ENCOUNTER — Ambulatory Visit (INDEPENDENT_AMBULATORY_CARE_PROVIDER_SITE_OTHER): Payer: 59

## 2011-06-07 DIAGNOSIS — J45909 Unspecified asthma, uncomplicated: Secondary | ICD-10-CM

## 2011-06-07 MED ORDER — OMALIZUMAB 150 MG ~~LOC~~ SOLR
225.0000 mg | Freq: Once | SUBCUTANEOUS | Status: AC
  Administered 2011-06-07: 225 mg via SUBCUTANEOUS

## 2011-06-14 ENCOUNTER — Encounter: Payer: 59 | Attending: Physical Medicine and Rehabilitation | Admitting: Physical Medicine and Rehabilitation

## 2011-06-14 ENCOUNTER — Ambulatory Visit: Payer: 59 | Admitting: Internal Medicine

## 2011-06-19 NOTE — Progress Notes (Signed)
Addended by: Dorie Rank E on: 06/19/2011 12:12 PM   Modules accepted: Orders

## 2011-06-28 ENCOUNTER — Telehealth: Payer: Self-pay | Admitting: Internal Medicine

## 2011-06-28 ENCOUNTER — Ambulatory Visit (INDEPENDENT_AMBULATORY_CARE_PROVIDER_SITE_OTHER): Payer: 59

## 2011-06-28 DIAGNOSIS — J45909 Unspecified asthma, uncomplicated: Secondary | ICD-10-CM

## 2011-06-28 MED ORDER — OMALIZUMAB 150 MG ~~LOC~~ SOLR
225.0000 mg | Freq: Once | SUBCUTANEOUS | Status: AC
  Administered 2011-06-28: 225 mg via SUBCUTANEOUS

## 2011-06-28 NOTE — Telephone Encounter (Signed)
Pt on Dulera 100 - no samples of this available at this time.  Will place 1 sample of proair at front for pick up.  Also, pt last seen by Dr. Maple Hudson in Dec and was told to keep Feb appt.  No pending appts.  Called, spoke with pt who states she cannot afford the dulera right now bc she cannot use her [employer] card.  States the dulera will be > $200 and would like samples.  Advised I will place 1 sample of proair at front for pick up but no samples of dulera available at this time.  She would like to know if Dr. Maple Hudson knows of a cheaper alternative or if we have a sample of something she can try in place of dulera right now.  Advised will send message to Dr. Maple Hudson to address -- she is ok with call back on Monday.  Note:  Pt did schedule f/u with Dr. Maple Hudson on April 1 (first available).

## 2011-06-28 NOTE — Telephone Encounter (Signed)
Per CY-okay to give sample of Dulera 200/5; pt is aware that sample is at front for pick up on Monday.

## 2011-07-12 ENCOUNTER — Ambulatory Visit (INDEPENDENT_AMBULATORY_CARE_PROVIDER_SITE_OTHER): Payer: 59

## 2011-07-12 DIAGNOSIS — J45909 Unspecified asthma, uncomplicated: Secondary | ICD-10-CM

## 2011-07-12 MED ORDER — OMALIZUMAB 150 MG ~~LOC~~ SOLR
225.0000 mg | Freq: Once | SUBCUTANEOUS | Status: AC
  Administered 2011-07-12: 225 mg via SUBCUTANEOUS

## 2011-07-24 ENCOUNTER — Telehealth: Payer: Self-pay | Admitting: Internal Medicine

## 2011-07-24 NOTE — Telephone Encounter (Signed)
I was explained to that patients Xolair WILL NOT ship on 07-25-11 as planned as there has been a lapse in insurance coverage for the patient. The company has tried every number on file to reach patient regarding this matter and phones are disconnected. I also tried every number on file and unsuccessful as phones have been disconnected. I have informed Tammy Scott in allergy regarding this matter and will sign off on phone note.[name]   NOTE: Pt will need to call the insurance (number provided) and update her insurance information before they can ship her Xolair.

## 2011-07-29 ENCOUNTER — Ambulatory Visit: Payer: 59 | Admitting: Internal Medicine

## 2011-08-06 ENCOUNTER — Encounter: Payer: Self-pay | Admitting: Internal Medicine

## 2011-08-06 ENCOUNTER — Ambulatory Visit (INDEPENDENT_AMBULATORY_CARE_PROVIDER_SITE_OTHER): Payer: Self-pay | Admitting: Internal Medicine

## 2011-08-06 VITALS — BP 116/76 | HR 85 | Ht 66.0 in | Wt 313.2 lb

## 2011-08-06 DIAGNOSIS — J45909 Unspecified asthma, uncomplicated: Secondary | ICD-10-CM

## 2011-08-06 MED ORDER — FLUTICASONE-SALMETEROL 250-50 MCG/DOSE IN AEPB: 1.0000 | INHALATION_SPRAY | Freq: Two times a day (BID) | RESPIRATORY_TRACT | Status: DC

## 2011-08-06 NOTE — Patient Instructions (Signed)
Use up the [facility] you already have. Then see if the Advair 250 is cheaper. Use one or the other, twice daily every day. Leave on the bathroom sink and rinse your mouth after use.  Try otc Claritin/ loratadine, or Allegra/ fexofenadine for the allergic nose.   Restart Xolair when able.

## 2011-08-06 NOTE — Progress Notes (Signed)
Patient ID: April [name], female    DOB: 28-Mar-1971, 41 y.o.   MRN: [id number]  HPI 5 yoF former smoker followed for chronic severe asthma. Hospitalized 4/11-12/12 for another exacerbation she says was triggered by catching a cold. Common triggers include colds and any smoke exposure. Back now to baseline. She has continued Xolair injection here every 2 weeks and does feel it has reduced frequency of flare-ups. Usual weight range 320-340 and she asks why weight is up to 360. Discussed steroids, including IV therapy at hospital but also ?role of Dulera 200-5. She is now being seen a [facility] anticipating breast reduction surgery and we talked about bariatric surgery. Takes omeprazole twice daily before meals, but has frequent heart burn.   04/12/11- 27 yoF former smoker followed for chronic severe asthma. Has had flu vaccine and pertussis vaccine. Says her breathing is okay. We had sent antibiotic for a cold with bronchitis around Thanksgiving. She denies any smoking at all now. She has avoided hospital partly because she has been better and partly because she is unwilling to go. Xolair has made a real improvement. She had breast reduction surgery without problems at [facility] and has considered bariatric surgery but can't afford it. Uses her nebulizer machine but not every day. Does continue Dulera and omeprazole. She is taking Ambien but still sleeps poorly at night. She no longer misses much work but that means she can't nap in the daytime. Complains of frequently waking at night but does not know if she snores.  04/25/11-  44 yoF former smoker followed for chronic severe asthma complicated by morbid obesity, HBP, depression. Her antidepressant was changed to Wellbutrin on December 18. Since then she can't sleep, using 3 Ambien per night. Increased asthma with wheezing. She increased to [facility] to 6 puffs daily. Had a cold at Thanksgiving but not since. She continues Xolair.  08/06/11- 20 yoF former  smoker followed for chronic severe asthma complicated by morbid obesity, HBP, depression, GERD. Change jobs and insurance. Had a lapse of 2 weeks in her Xolair therapy but ready to restart. Work environment is clear and cool, indoors. Watery rhinorrhea not helped by 3 intermittent use of Flonase. No acute asthma attack but uses her Dulera and rescue inhaler every night at bedtime. Ambien helps sleep.  Review of Systems-See HPI Constitutional:   No-   weight loss, night sweats, fevers, chills, +fatigue, lassitude. HEENT:   No-  headaches, difficulty swallowing, tooth/dental problems, sore throat,       No-  sneezing, itching, ear ache, nasal congestion, post nasal drip,  CV:  No-   chest pain, orthopnea, PND, swelling in lower extremities, anasarca, dizziness, palpitations Resp: No-acute shortness of breath with exertion or at rest.              No-   productive cough,  No non-productive cough,  No- coughing up of blood.              No-   change in color of mucus. No- increased wheezing.   Skin: No-   rash or lesions. GI:  No-   heartburn, indigestion, abdominal pain, nausea, vomiting,  GU: MS:  No-   joint pain or swelling.   Neuro-     nothing unusual Psych:  No- change in mood or affect. No depression or anxiety.  No memory loss.      Objective:   Physical Exam General- Alert, Oriented, Affect-appropriate, Distress- none acute, very obese,  Skin- rash-none, lesions-  none, excoriation- none Lymphadenopathy- none Head- atraumatic            Eyes- Gross vision intact, PERRLA, conjunctivae clear secretions            Ears- Hearing, canals-normal            Nose- Clear, no-Septal dev, mucus, polyps, erosion, perforation             Throat- Mallampati II , mucosa clear , drainage- none, tonsils- atrophic Neck- flexible , trachea midline, no stridor , thyroid nl, carotid no bruit Chest - symmetrical excursion , unlabored           Heart/CV- RRR , no murmur , no gallop  , no rub, nl s1  s2                           - JVD- none , edema- none, stasis changes- none, varices- none           Lung- Diminished, unlabored. No wheeze,  , dullness-none, rub- none           Chest wall-  Abd-  Br/ Gen/ Rectal- Not done, not indicated Extrem- cyanosis- none, clubbing, none, atrophy- none, strength- nl Neuro- grossly intact to observation

## 2011-08-11 NOTE — Assessment & Plan Note (Signed)
She is ready to resume Xolair now that insurance is reestablished with new job. Maintaining control no recent emergency room visits. Has remained off of cigarettes.

## 2011-08-11 NOTE — Assessment & Plan Note (Signed)
Consider referral to the [facility] nutrition [facility]

## 2011-08-22 IMAGING — CR DG CHEST 2V
2 series · 2 of 2 positions shown · non-contrast
Comparison: 03/04/2010.

CLINICAL DATA: Shortness of breath, headache and congestion.
Asthma exacerbation.

CHEST - 2 VIEW

[w chest pa]
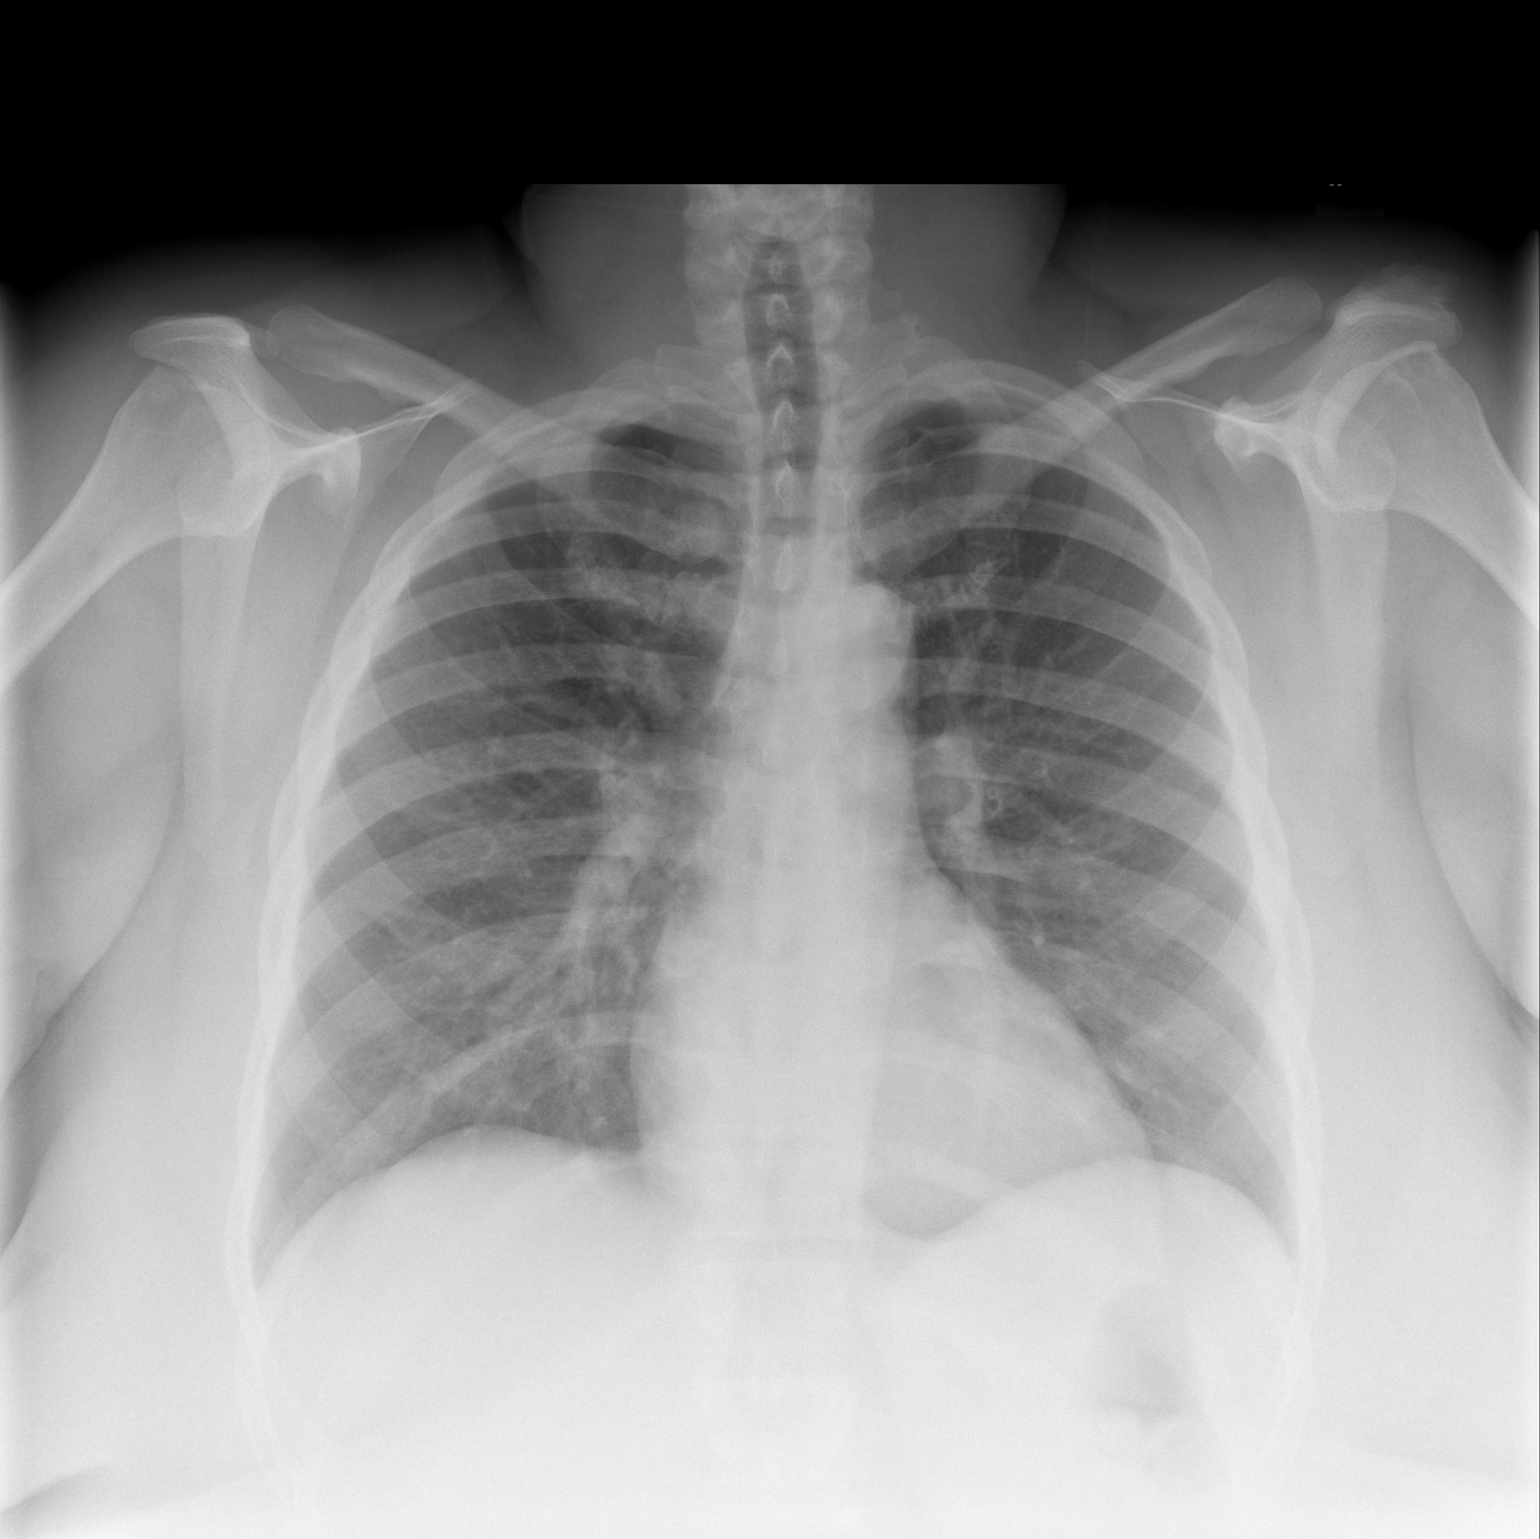

[w chest lat]
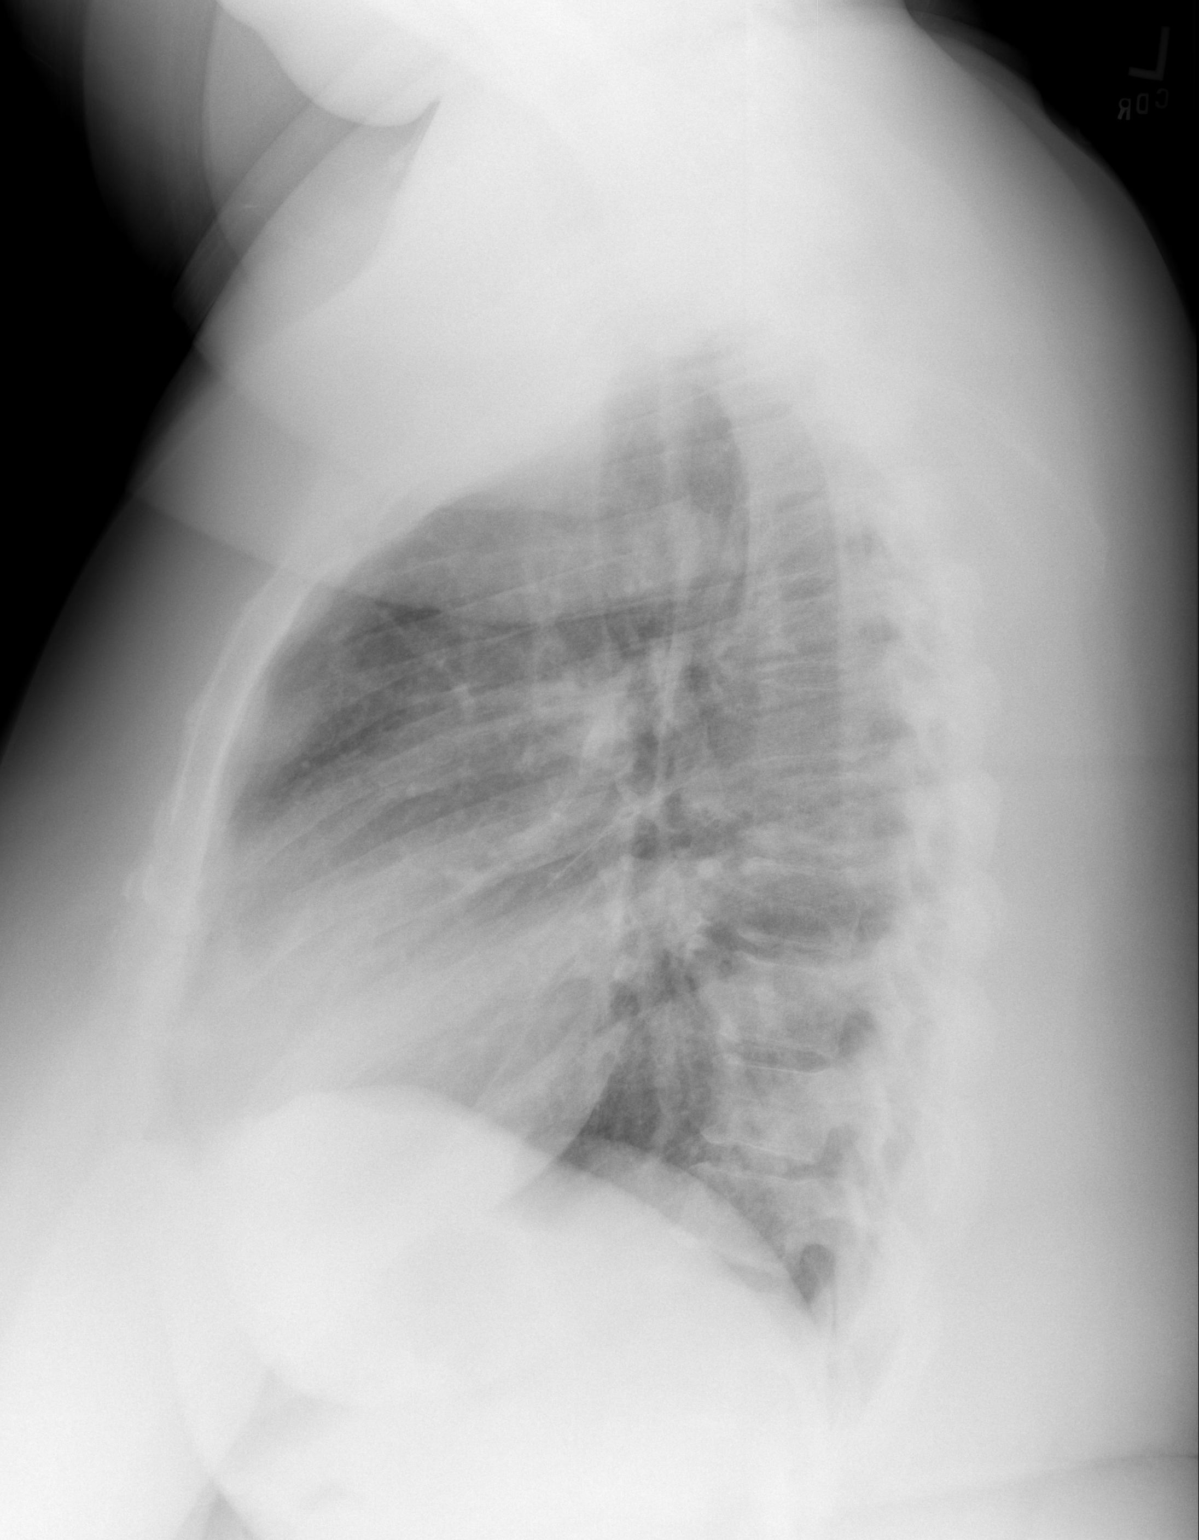

[2 of 2 positions shown; findings below may reference images not displayed]

FINDINGS: The heart size and mediastinal contours are stable.
There is improved aeration of the lungs.  There is probable mild
chronic central airway thickening.  No edema or confluent airspace
opacity is present.  Generalized osteosclerosis is again noted as
described most recent examination.
IMPRESSION: 1.  No acute cardiopulmonary process.
2.  Generalized osteosclerosis as noted previously.

## 2011-08-24 IMAGING — CR DG CHEST 2V
2 series · 2 of 2 positions shown · non-contrast
Comparison: Chest radiograph performed 08/06/2010

CLINICAL DATA: Shortness of breath, cough, wheezing and headache.

CHEST - 2 VIEW

[w chest pa]
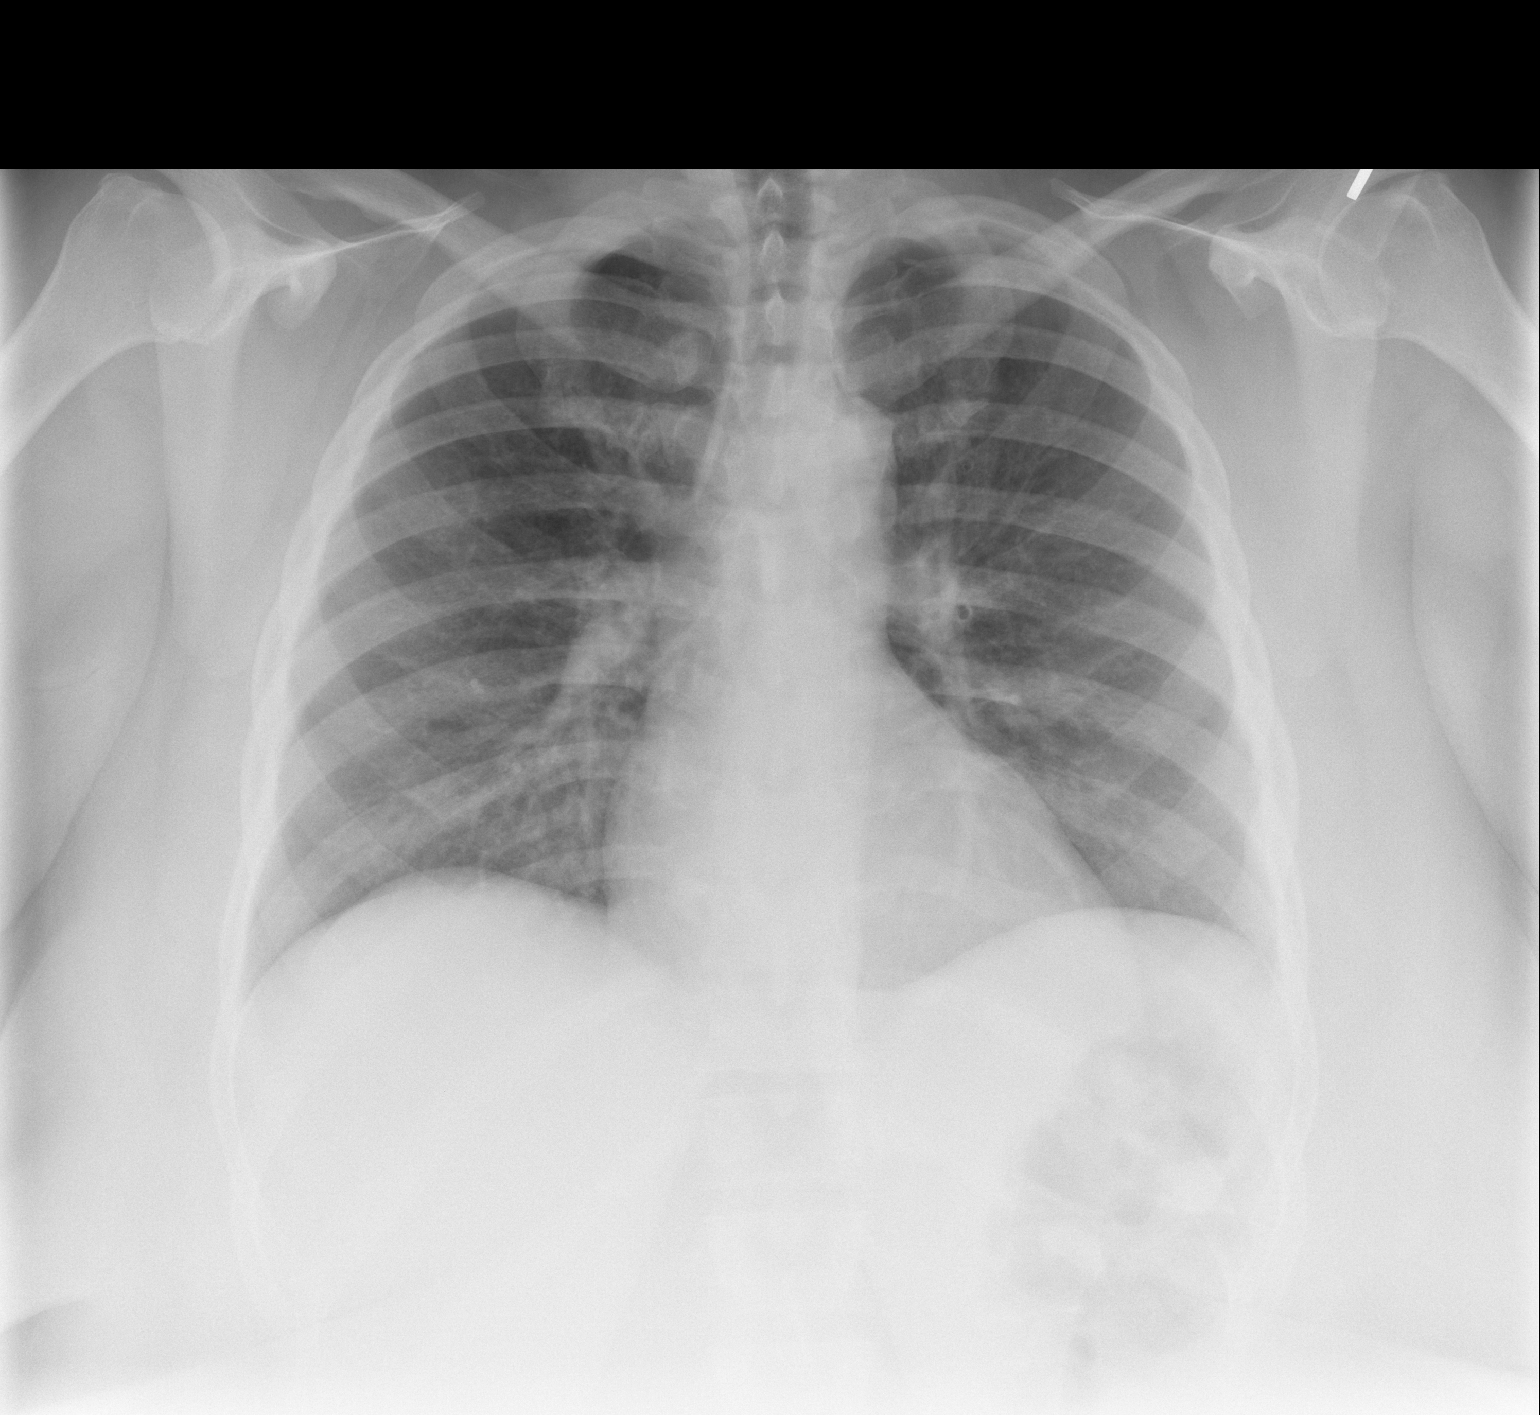

[w chest lat]
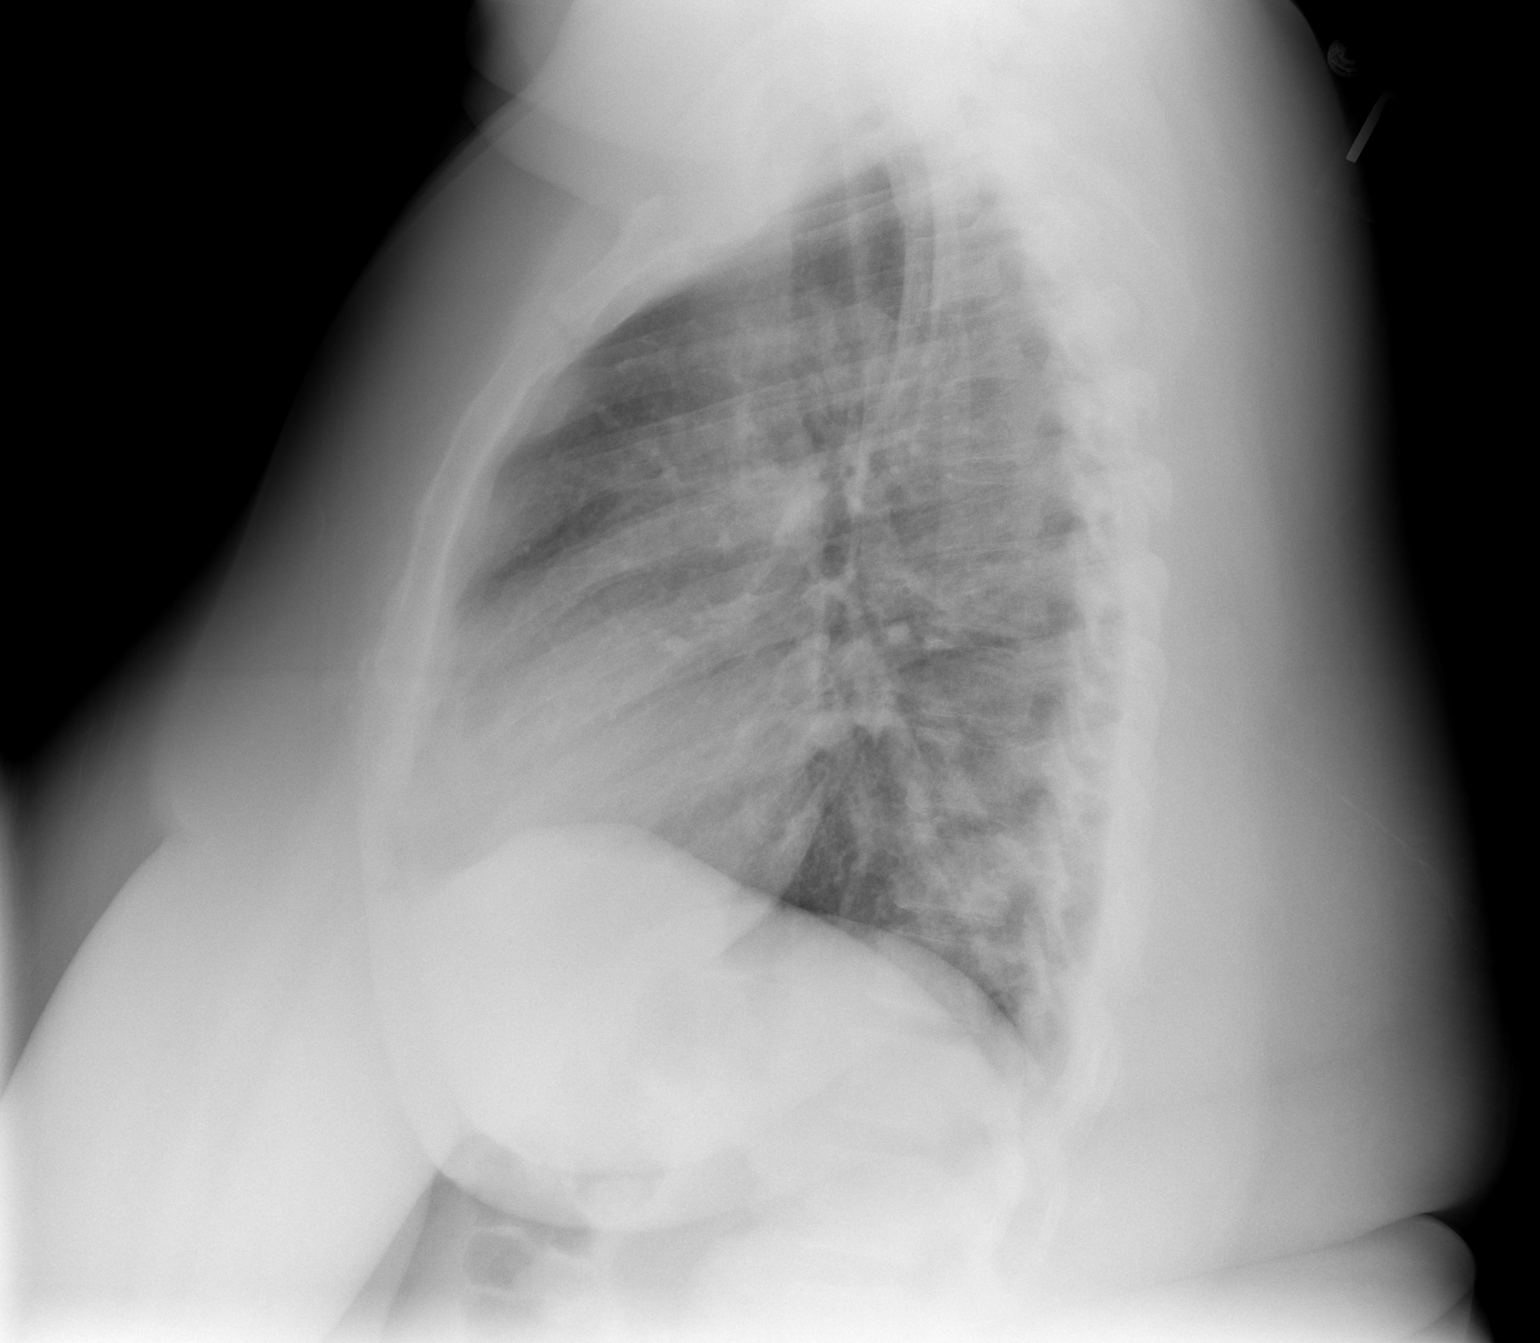

[2 of 2 positions shown; findings below may reference images not displayed]

FINDINGS: The lungs are well-aerated.  Peribronchial thickening is
noted.  There is no evidence of focal opacification, pleural
effusion or pneumothorax.

The heart is normal in size; the mediastinal contour is within
normal limits.  No acute osseous abnormalities are seen.
IMPRESSION: Peribronchial thickening noted; lungs otherwise clear.

## 2011-09-09 ENCOUNTER — Telehealth: Payer: Self-pay | Admitting: Internal Medicine

## 2011-09-09 NOTE — Telephone Encounter (Signed)
Pt states that she is changing jobs and wil not have insurance for 3 months. She requests patient assistance forms and samples. I have PA forms for advair, dulera, and xolair and samples placed at front for the pt. Pt aware. Carron Curie, CMA

## 2011-09-12 ENCOUNTER — Encounter: Payer: Self-pay | Admitting: Internal Medicine

## 2011-09-25 ENCOUNTER — Encounter (INDEPENDENT_AMBULATORY_CARE_PROVIDER_SITE_OTHER): Payer: Self-pay | Admitting: Ophthalmology

## 2011-09-25 VITALS — BP 152/91 | HR 77 | Temp 98.0°F | Resp 20 | Ht 66.0 in | Wt 332.8 lb

## 2011-09-25 DIAGNOSIS — Z111 Encounter for screening for respiratory tuberculosis: Secondary | ICD-10-CM

## 2011-09-25 NOTE — Progress Notes (Signed)
Pt states she does not want to see dr today. Leg pain about which she called for appt no longer an issue. Pt states "my blood pressure is up but I haven't taken my meds for 2 days so don't need to see dr today - will take my med tonight."  TB test placed at pt's request as pre-requisite for a job. Pt instructed to come back to clinic on Friday PM approx. 4:30 P to have test read

## 2011-09-26 ENCOUNTER — Telehealth: Payer: Self-pay | Admitting: Internal Medicine

## 2011-09-26 ENCOUNTER — Other Ambulatory Visit: Payer: Self-pay | Admitting: *Deleted

## 2011-09-26 MED ORDER — FLUTICASONE-SALMETEROL 250-50 MCG/DOSE IN AEPB: 1.0000 | INHALATION_SPRAY | Freq: Two times a day (BID) | RESPIRATORY_TRACT | Status: DC

## 2011-09-26 NOTE — Telephone Encounter (Signed)
RX for patient assistance program.

## 2011-09-26 NOTE — Telephone Encounter (Signed)
Called and spoke with pt and she is aware that we have not heard back about her xolair or the pt assistance.  Pt is aware that once we hear back from these that we will contact her and let her know the decisions.  Pt voiced her understanding

## 2011-09-27 ENCOUNTER — Ambulatory Visit: Payer: Self-pay

## 2011-09-28 HISTORY — PX: REDUCTION MAMMAPLASTY: SUR839

## 2011-10-08 ENCOUNTER — Telehealth: Payer: Self-pay | Admitting: Internal Medicine

## 2011-10-08 NOTE — Telephone Encounter (Signed)
Left message that we have sent information to company and will have Chantel check again in morning about this.

## 2011-10-09 ENCOUNTER — Telehealth: Payer: Self-pay | Admitting: Critical Care Medicine

## 2011-10-09 NOTE — Telephone Encounter (Signed)
Waiting for pt assistance approval/denial-left 1 sample of Ventolin HFA at front desk for patient to pick up. Pt will come by the office today or tomorrow to pick up sample.

## 2011-10-09 NOTE — Telephone Encounter (Signed)
Patient calling requesting to speak to Chantel about xolair.  Asking for call back ASAP.

## 2011-10-09 NOTE — Telephone Encounter (Signed)
Spoke with patient-aware that Xolair Assistance company needs pt to fax her income information to then ASAP. Pt states she has the number and will fax today.

## 2011-10-16 ENCOUNTER — Telehealth: Payer: Self-pay | Admitting: Internal Medicine

## 2011-10-16 NOTE — Telephone Encounter (Signed)
Spoke with patient-aware that I will need to get with Chantel about Xolair; I have not seen anything come across my desk. Will call patient back once I have an answer.

## 2011-10-24 NOTE — Telephone Encounter (Signed)
Spoke with Xolair Company-state they are still waiting on patient to fax her 2012 tax return information(1040 form); I informed patient of this and she states she faxed this already. I advised patient to call the company and speak with someone there as I could not change this for her. Also, sample left of Ventolin HFA at front desk for patient and she is aware.

## 2011-10-24 NOTE — Telephone Encounter (Signed)
Checking on status of Xolair.  Also, pt requests sample of rescue inhaler.  April [name]

## 2011-10-30 ENCOUNTER — Telehealth: Payer: Self-pay | Admitting: Internal Medicine

## 2011-10-30 IMAGING — CR DG ANKLE COMPLETE 3+V*L*
3 series · 3 of 3 positions shown · non-contrast
Comparison: 01/04/2009

CLINICAL DATA: Fall with left ankle pain.

LEFT ANKLE COMPLETE - 3+ VIEW

[view not recorded (1 of 3)]
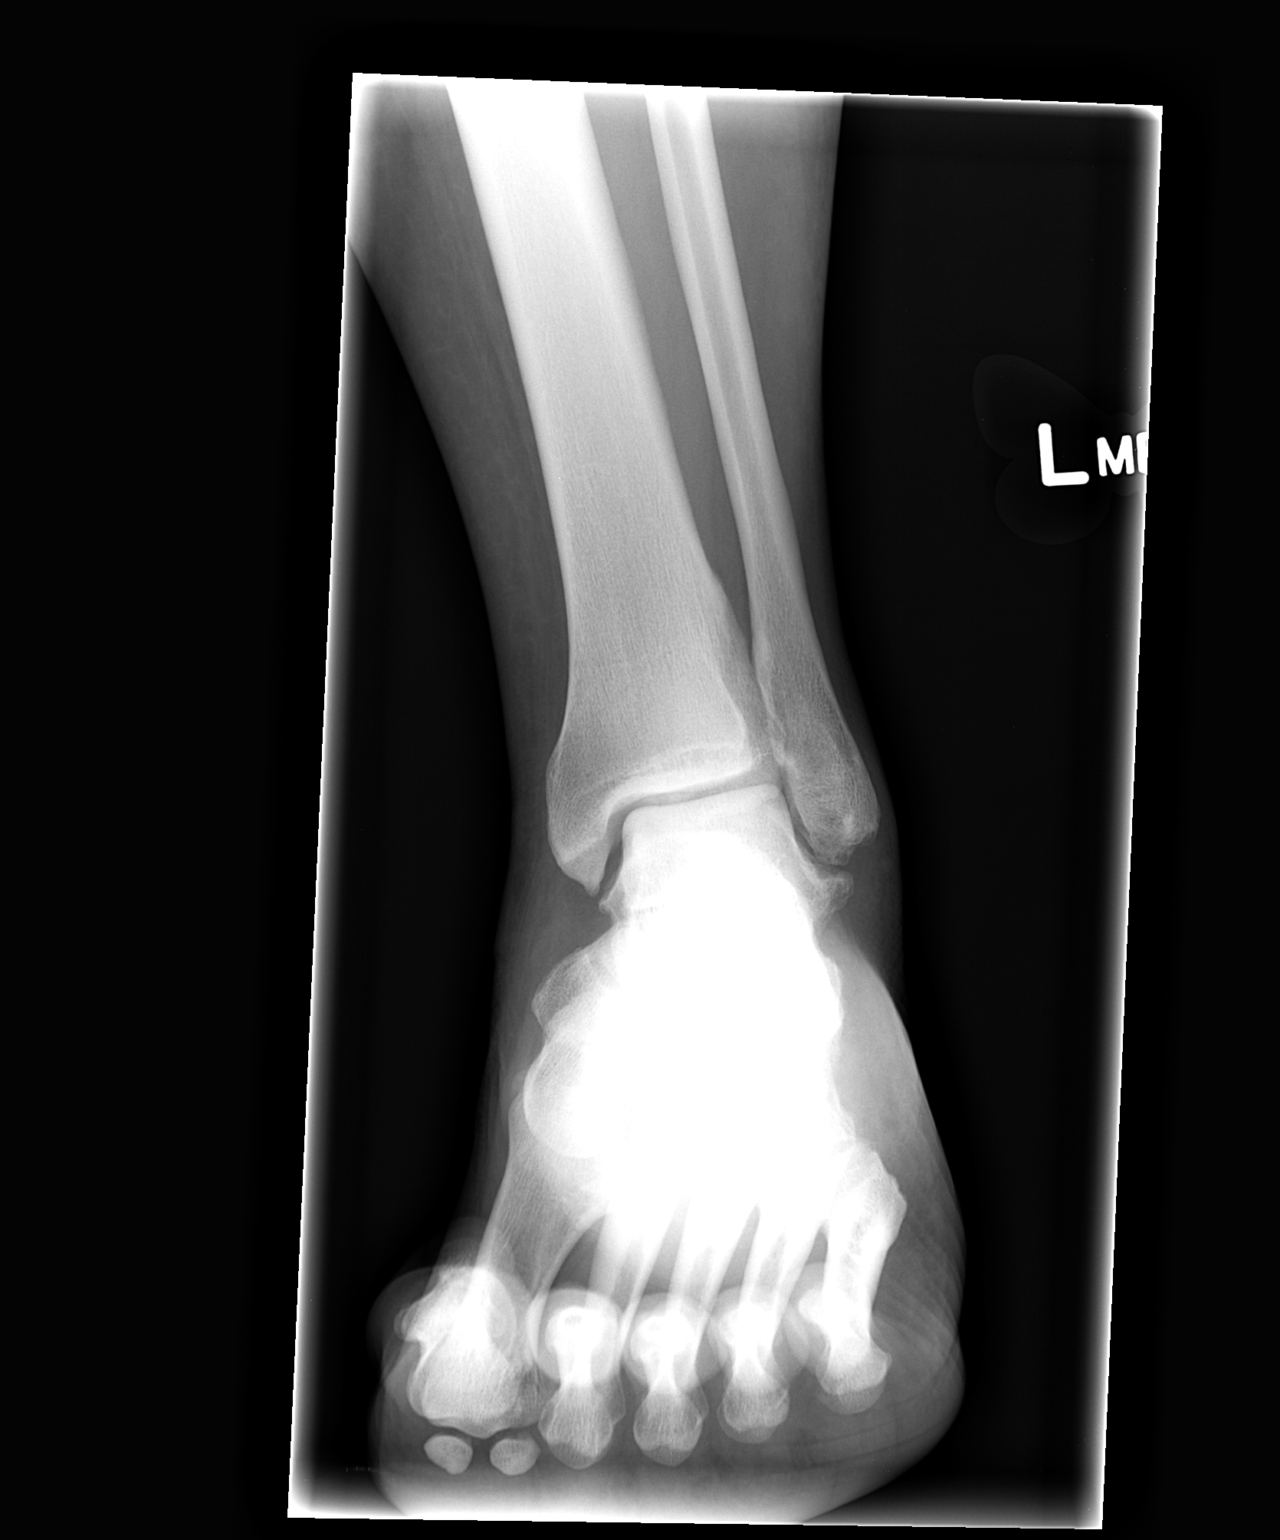

[view not recorded (2 of 3)]
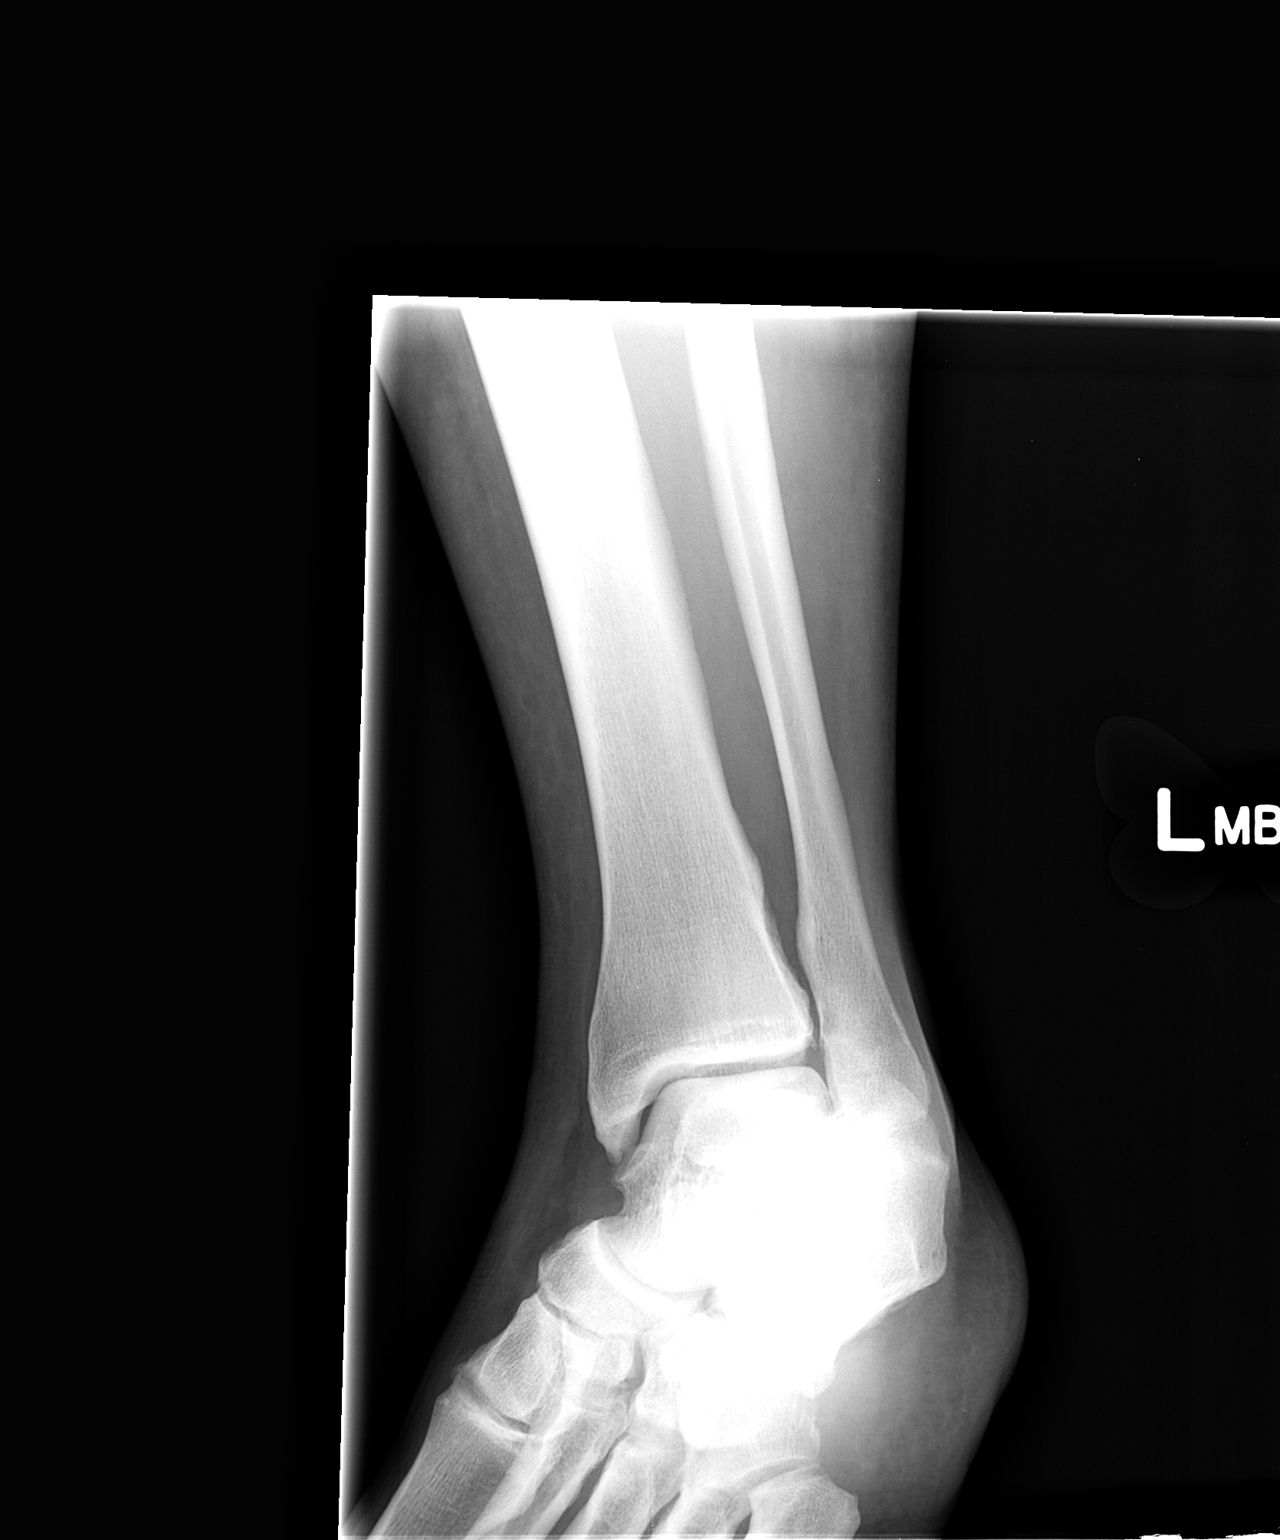

[view not recorded (3 of 3)]
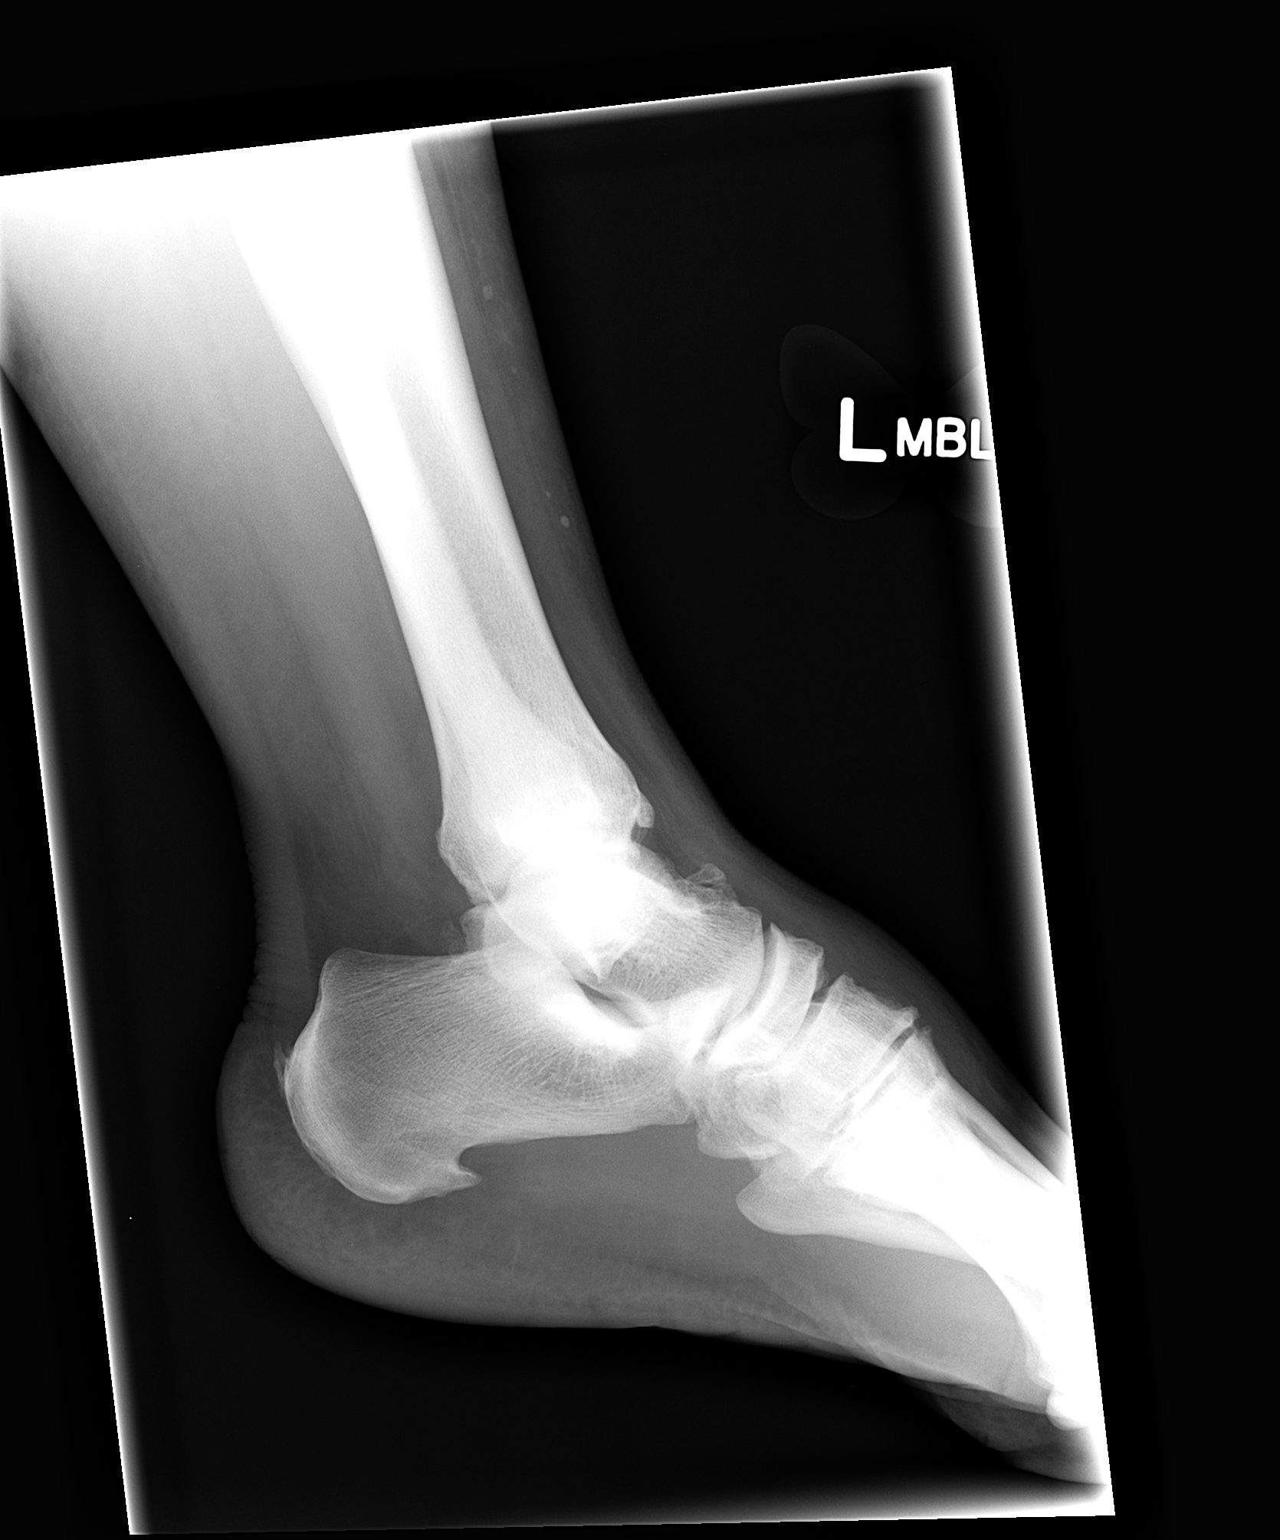

[3 of 3 positions shown; findings below may reference images not displayed]

FINDINGS: No acute fracture or dislocation identified.  Again noted
are some degenerative changes involving both medial and lateral
malleoli which do appear to have progressed slightly since 0262.
Ankle mortise is symmetric.
IMPRESSION: No acute fracture.  Some progression of degenerative changes
involving both medial and lateral malleoli.

## 2011-10-30 NOTE — Telephone Encounter (Signed)
Pt called back-she has been put on Allergy Injection schedule for Friday 11-01-11 at 1 pm. Pt is aware of 2 hour wait as well Tammy Scott and [facility].

## 2011-10-30 NOTE — Telephone Encounter (Signed)
Spoke with April [name]-pt was approved and states shipment was sent to our office and was to arrive today. I confirmed with Dimas Millin in allergy that Xolair was here. I have left a message for patient to call me back so we can schedule date/time for pt to start shots-will need wait 2 hours after injection.

## 2011-11-08 ENCOUNTER — Telehealth: Payer: Self-pay | Admitting: Internal Medicine

## 2011-11-08 NOTE — Telephone Encounter (Signed)
I spoke with pt and advised her we did not have any samples at this time of rescue inhaler

## 2011-11-11 ENCOUNTER — Ambulatory Visit (INDEPENDENT_AMBULATORY_CARE_PROVIDER_SITE_OTHER): Payer: Self-pay

## 2011-11-11 ENCOUNTER — Telehealth: Payer: Self-pay | Admitting: *Deleted

## 2011-11-11 DIAGNOSIS — J45909 Unspecified asthma, uncomplicated: Secondary | ICD-10-CM

## 2011-11-11 NOTE — Telephone Encounter (Signed)
Pt.came in for her first xolair in 4 months with the understanding she would need to wait 2hrs.. Her friend she brought with her handed me(T.Scott) a note saying she had an accident in her clothes,she had to go(Only 20 or 25 mins.had passed.) Selena Batten checked the women's bathroom & lobby out  front no sign of pt.. I(T.Scott) called Mrs.Hannula,no answer,I left a message on her cell asking if she was okay, to come back when she changed and gave her our # to [phone]). I called her within 5 to 10 mins. Of this happening. When her friend gave me the note,I went to tell [name] couldn't find her(for a min.), discussed a ph.note needed to be created and call pt.. I had another call in the process. By leaving we are not liable if she has a reaction.

## 2011-11-11 NOTE — Telephone Encounter (Signed)
April [name] called me back at 2:20 and apologized for having to leave early. She had started her period and was unprepared. I asked if she had a local reaction:No;I ask if she had any dizziness or trouble breathing,No.Told her I would see her in two wks.[name]

## 2011-11-12 MED ORDER — OMALIZUMAB 150 MG ~~LOC~~ SOLR
225.0000 mg | Freq: Once | SUBCUTANEOUS | Status: AC
  Administered 2011-11-12: 225 mg via SUBCUTANEOUS

## 2011-11-21 ENCOUNTER — Telehealth: Payer: Self-pay | Admitting: Internal Medicine

## 2011-11-21 NOTE — Telephone Encounter (Signed)
Sample of ventolin up front ATC the pt and number is not valid currently, [facility] tommorrow

## 2011-11-22 NOTE — Telephone Encounter (Signed)
ATC patient no answer. Lmtcb 

## 2011-11-22 NOTE — Telephone Encounter (Signed)
Pt aware Ventolin inhaler ready for pick up and pt assistance form for Ventolin placed up front as well.

## 2011-11-22 NOTE — Telephone Encounter (Signed)
[id number] on cell number, home number is not valid. Carron Curie, CMA

## 2011-11-22 NOTE — Telephone Encounter (Signed)
Pt returned our call Emily E McAlister  °

## 2011-11-25 ENCOUNTER — Ambulatory Visit (INDEPENDENT_AMBULATORY_CARE_PROVIDER_SITE_OTHER): Payer: Self-pay

## 2011-11-25 DIAGNOSIS — J45909 Unspecified asthma, uncomplicated: Secondary | ICD-10-CM

## 2011-11-25 MED ORDER — OMALIZUMAB 150 MG ~~LOC~~ SOLR
225.0000 mg | Freq: Once | SUBCUTANEOUS | Status: AC
  Administered 2011-11-25: 225 mg via SUBCUTANEOUS

## 2011-12-06 ENCOUNTER — Ambulatory Visit: Payer: Self-pay | Admitting: Internal Medicine

## 2011-12-09 ENCOUNTER — Telehealth: Payer: Self-pay | Admitting: Internal Medicine

## 2011-12-09 NOTE — Telephone Encounter (Signed)
LMTCB

## 2011-12-09 NOTE — Telephone Encounter (Signed)
Per CY-we DO NOT have any rescue inhaler samples; pt missed her recent appt as well; we can RX an inhaler for her.

## 2011-12-09 NOTE — Telephone Encounter (Signed)
Spoke with patient and she states that she is having increased SOB and does not have a rescue inhaler. She is coming in for her Xolair injection tomorrow and is wanting to know if she can have a sample of her Albuterol HFA if we have any.  Pt denies any cough, chest tightness and wheezing.  Please advise Dr Maple Hudson.  Allergies  Allergen Reactions  . Ace Inhibitors     REACTION: Cough  . Zolpidem Tartrate     Hallucinations

## 2011-12-10 ENCOUNTER — Ambulatory Visit (INDEPENDENT_AMBULATORY_CARE_PROVIDER_SITE_OTHER): Payer: Self-pay

## 2011-12-10 DIAGNOSIS — J45909 Unspecified asthma, uncomplicated: Secondary | ICD-10-CM

## 2011-12-10 MED ORDER — OMALIZUMAB 150 MG ~~LOC~~ SOLR
225.0000 mg | Freq: Once | SUBCUTANEOUS | Status: AC
  Administered 2011-12-10: 225 mg via SUBCUTANEOUS

## 2011-12-10 NOTE — Telephone Encounter (Signed)
Called, spoke with pt.  Informed her we do not have any samples of rescue inhalers and offered rx.  Pt states she does not have insurance right now.  Therefore, wanted a sample.  I apologized for no samples being available and offered pt assistance.  Pt states she is already in the process of trying to get this.  Advised if anything further is needed, pls call back.

## 2011-12-10 NOTE — Telephone Encounter (Signed)
lmomtcb x2 for pt 

## 2011-12-10 NOTE — Telephone Encounter (Signed)
Pt returned our call Emily E McAlister  °

## 2011-12-13 ENCOUNTER — Telehealth: Payer: Self-pay | Admitting: Internal Medicine

## 2011-12-13 NOTE — Telephone Encounter (Signed)
Called and spoke with pt and she stated that she is still waiting for pt assistance on her inhaler----advised the pt that we have no samples at this time. Pt will call back next week.

## 2011-12-18 ENCOUNTER — Telehealth: Payer: Self-pay | Admitting: Internal Medicine

## 2011-12-18 NOTE — Telephone Encounter (Signed)
LMOM TCB x1 to inform pt we have no samples at this time.

## 2011-12-19 NOTE — Telephone Encounter (Signed)
LMOM that no samples of rescued inhalers available at this time.

## 2011-12-25 ENCOUNTER — Telehealth: Payer: Self-pay | Admitting: Internal Medicine

## 2011-12-25 ENCOUNTER — Ambulatory Visit (INDEPENDENT_AMBULATORY_CARE_PROVIDER_SITE_OTHER): Payer: Self-pay

## 2011-12-25 DIAGNOSIS — J45909 Unspecified asthma, uncomplicated: Secondary | ICD-10-CM

## 2011-12-25 MED ORDER — OMALIZUMAB 150 MG ~~LOC~~ SOLR
225.0000 mg | Freq: Once | SUBCUTANEOUS | Status: AC
  Administered 2011-12-25: 225 mg via SUBCUTANEOUS

## 2011-12-25 NOTE — Telephone Encounter (Signed)
Pt has not heard anything from [employer] about pt assistance. Left Rescue inhaler in allergy lab for patient to pick up when she gets her injections.

## 2011-12-25 NOTE — Telephone Encounter (Signed)
LMTCB

## 2011-12-31 ENCOUNTER — Encounter (HOSPITAL_COMMUNITY): Payer: Self-pay | Admitting: Emergency Medicine

## 2011-12-31 ENCOUNTER — Emergency Department (HOSPITAL_COMMUNITY): Payer: Self-pay

## 2011-12-31 ENCOUNTER — Emergency Department (HOSPITAL_COMMUNITY)
Admission: EM | Admit: 2011-12-31 | Discharge: 2011-12-31 | Disposition: A | Discharge status: Home / Self Care | Payer: Self-pay | Attending: Emergency Medicine | Admitting: Emergency Medicine

## 2011-12-31 DIAGNOSIS — J45901 Unspecified asthma with (acute) exacerbation: Secondary | ICD-10-CM

## 2011-12-31 DIAGNOSIS — J441 Chronic obstructive pulmonary disease with (acute) exacerbation: Secondary | ICD-10-CM | POA: Insufficient documentation

## 2011-12-31 DIAGNOSIS — I10 Essential (primary) hypertension: Secondary | ICD-10-CM | POA: Insufficient documentation

## 2011-12-31 DIAGNOSIS — Z87891 Personal history of nicotine dependence: Secondary | ICD-10-CM | POA: Insufficient documentation

## 2011-12-31 DIAGNOSIS — G4700 Insomnia, unspecified: Secondary | ICD-10-CM | POA: Insufficient documentation

## 2011-12-31 DIAGNOSIS — K219 Gastro-esophageal reflux disease without esophagitis: Secondary | ICD-10-CM | POA: Insufficient documentation

## 2011-12-31 MED ORDER — ALBUTEROL SULFATE (5 MG/ML) 0.5% IN NEBU
2.5000 mg | INHALATION_SOLUTION | Freq: Once | RESPIRATORY_TRACT | Status: AC
  Administered 2011-12-31: 2.5 mg via RESPIRATORY_TRACT
  Filled 2011-12-31: qty 20

## 2011-12-31 MED ORDER — PREDNISONE 20 MG PO TABS
60.0000 mg | ORAL_TABLET | Freq: Once | ORAL | Status: AC
  Administered 2011-12-31: 60 mg via ORAL
  Filled 2011-12-31: qty 3

## 2011-12-31 MED ORDER — IPRATROPIUM BROMIDE 0.02 % IN SOLN
RESPIRATORY_TRACT | Status: AC
  Administered 2011-12-31: 1 mg
  Filled 2011-12-31: qty 2.5

## 2011-12-31 MED ORDER — ALBUTEROL SULFATE HFA 108 (90 BASE) MCG/ACT IN AERS
2.0000 | INHALATION_SPRAY | Freq: Once | RESPIRATORY_TRACT | Status: AC
  Administered 2011-12-31: 2 via RESPIRATORY_TRACT
  Filled 2011-12-31: qty 6.7

## 2011-12-31 MED ORDER — PREDNISONE 50 MG PO TABS: ORAL_TABLET | ORAL | Status: AC

## 2011-12-31 MED ORDER — METHYLPREDNISOLONE SODIUM SUCC 125 MG IJ SOLR
125.0000 mg | Freq: Once | INTRAMUSCULAR | Status: DC
  Filled 2011-12-31: qty 2

## 2011-12-31 MED ORDER — FLUTICASONE-SALMETEROL 250-50 MCG/DOSE IN AEPB: 1.0000 | INHALATION_SPRAY | Freq: Every day | RESPIRATORY_TRACT | Status: DC

## 2011-12-31 MED ORDER — ALBUTEROL (5 MG/ML) CONTINUOUS INHALATION SOLN
INHALATION_SOLUTION | RESPIRATORY_TRACT | Status: AC
  Administered 2011-12-31: 2.5 mg
  Filled 2011-12-31: qty 40

## 2011-12-31 MED ORDER — FLUTICASONE-SALMETEROL 250-50 MCG/DOSE IN AEPB
1.0000 | INHALATION_SPRAY | Freq: Two times a day (BID) | RESPIRATORY_TRACT | Status: DC
  Administered 2011-12-31: 1 via RESPIRATORY_TRACT
  Filled 2011-12-31: qty 14

## 2011-12-31 NOTE — ED Provider Notes (Signed)
History     CSN: [id number]  Arrival date & time 12/31/11  1100   First MD Initiated Contact with Patient 12/31/11 1225      Chief Complaint  Patient presents with  . Shortness of Breath    (Consider location/radiation/quality/duration/timing/severity/associated sxs/prior treatment) HPI Comments: April [name] 41 y.o. female   The chief complaint is: Patient presents with:   Shortness of Breath   The patient has medical history significant for:   Past Medical History:   Acanthosis nigricans                                         Menorrhagia                                                  Asthma with exacerbation                                     Hypertension, essential                                      Pain, dental                                                 Insomnia                                                     Morbid obesity                                               Helicobacter pylori (H. pylori) infection                    Sleep apnea                                                  COPD (chronic obstructive pulmonary disease)                 Asthma                                                         Comment:skin test 02/01/09 pos hemorrhoids   Depression  GERD (gastroesophageal reflux disease)                       Right wrist pain                                             Tobacco user                                                 Depression                                                  Patient presents with wheezing and SOB that began last night. Associated symptoms include DOE and a non-productive cough. Patient states that she has had to use her rescue inhaler more frequently. Denies fever, chills, nigh sweats. Denies NVD or abdominal pain. Denies sore throat, congestion, rhinorrhea, or sinus pressure. Denies sick contacts.      The history is provided by the patient.    Past  Medical History  Diagnosis Date  . Acanthosis nigricans   . Menorrhagia   . Asthma with exacerbation   . Hypertension, essential   . Pain, dental   . Insomnia   . Morbid obesity   . Helicobacter pylori (H. pylori) infection   . Sleep apnea   . COPD (chronic obstructive pulmonary disease)   . Asthma     skin test 02/01/09 pos hemorrhoids  . Depression   . GERD (gastroesophageal reflux disease)   . Right wrist pain   . Tobacco user   . Depression     Past Surgical History  Procedure Date  . Tubal ligation 1996    bilateral  . Breast reduction surgery 09/2010    Family History  Problem Relation Age of Onset  . Asthma Daughter   . Cancer Paternal Aunt   . Asthma Maternal Grandmother     History  Substance Use Topics  . Smoking status: Former Smoker    Types: Cigarettes    Quit date: 06/26/2007  . Smokeless tobacco: Never Used  . Alcohol Use: No    OB History    Grav Para Term Preterm Abortions TAB SAB Ect Mult Living                  Review of Systems  HENT: Negative for congestion, sore throat, rhinorrhea and sinus pressure.   Respiratory: Positive for cough, shortness of breath and wheezing.   Gastrointestinal: Negative for nausea, vomiting, abdominal pain and diarrhea.  All other systems reviewed and are negative.    Allergies  Ace inhibitors and Zolpidem tartrate  Home Medications   Current Outpatient Rx  Name Route Sig Dispense Refill  . ALBUTEROL SULFATE HFA 108 (90 BASE) MCG/ACT IN AERS Inhalation Inhale 1-2 puffs into the lungs every 6 (six) hours as needed for wheezing. 1 Inhaler 0  . BUPROPION HCL ER (XL) 150 MG PO TB24 Oral Take 1 tablet (150 mg total) by mouth daily. 30 tablet 3  . DOCUSATE SODIUM 100 MG PO CAPS Oral Take 1 capsule (100 mg total) by mouth  2 (two) times daily as needed for constipation. 60 capsule 0  . EPINEPHRINE 0.3 MG/0.3ML IJ DEVI Intramuscular Inject 0.3 mg into the muscle as needed. For severe allergic reaction     .  FLUTICASONE-SALMETEROL 250-50 MCG/DOSE IN AEPB Inhalation Inhale 1 puff into the lungs 2 (two) times daily. Rinse mouth 3 each 3  . HYDROCHLOROTHIAZIDE 50 MG PO TABS Oral Take 0.5 tablets (25 mg total) by mouth daily. 30 tablet 11  . MOMETASONE FURO-FORMOTEROL FUM 100-5 MCG/ACT IN AERO Inhalation Inhale 2 puffs into the lungs 2 (two) times daily. 1 Inhaler 09  . OMALIZUMAB 150 MG [place] SOLR  300 mg every 14 (fourteen) days.      . OMEPRAZOLE MAGNESIUM 20 MG PO TBEC Oral Take 20 mg by mouth 2 (two) times daily.    [name] VALSARTAN 160 MG PO TABS Oral Take 1 tablet (160 mg total) by mouth daily. 30 tablet 11  . MOMETASONE FURO-FORMOTEROL FUM 100-5 MCG/ACT IN AERO Inhalation Inhale 2 Act into the lungs 2 (two) times daily at 10 AM and 5 PM. Rinse mouth 1 Inhaler prn    BP 152/78  Pulse 82  Temp 99 F (37.2 C) (Oral)  Resp 20  SpO2 100%  LMP 12/17/2011  Physical Exam  Nursing note and vitals reviewed. Constitutional: She appears well-developed and well-nourished. She appears distressed.  HENT:  Head: Normocephalic and atraumatic.  Mouth/Throat: Oropharynx is clear and moist.  Eyes: Conjunctivae and EOM are normal. No scleral icterus.  Neck: Normal range of motion. Neck supple.  Cardiovascular: Normal rate, regular rhythm and normal heart sounds.   Pulmonary/Chest: She has wheezes.       Wheezes in all lung fields.  Abdominal: Soft. Bowel sounds are normal. There is no tenderness.  Neurological: She is alert.  Skin: Skin is warm.    ED Course  Procedures (including critical care time)  Labs Reviewed - No data to display Dg Chest 2 View  12/31/2011  *RADIOLOGY REPORT*  Clinical Data: Shortness of breath, asthma  CHEST - 2 VIEW  Comparison: 08/08/2010; 08/06/2010; 03/04/2010  Findings: Grossly unchanged cardiac silhouette and mediastinal contours.  No focal airspace opacities.  No pleural effusion or pneumothorax.  No acute osseous abnormalities.  IMPRESSION: No acute cardiopulmonary disease.    Original Report Authenticated By: Waynard Reeds, M.D.      1. Asthma exacerbation       MDM  Patient presented with acute asthma exacerbation. CXR: unremarkable. Patient given two albuterol neb treatments in ED. Patient reassessed and improved, less wheezing on exam. Patient walked and did not desat (97-100%) per nursing staff. Patient sent home with rescue inhaler and symbicort. Return precautions given. No red flags for PNA, bronchitis, or bronchospasm.        Pixie Casino, PA-C 12/31/11 1446

## 2011-12-31 NOTE — ED Provider Notes (Signed)
Medical screening examination/treatment/procedure(s) were conducted as a shared visit with non-physician practitioner(s) and myself.  I personally evaluated the patient during the encounter  Hx asthma with wheezing and cough x 2 days.  No chest pain.  Clear with nebs x2 and steroids.  Ambulatory without desaturation  Glynn Octave, MD 12/31/11 1701

## 2011-12-31 NOTE — ED Notes (Signed)
Patients o2 saturations are between 97-100 while ambulating.

## 2011-12-31 NOTE — ED Notes (Signed)
Pt discharged with friend, pt ambulatory, pt denies any pain, pt requesting to leave. Inhalers given to pt prior discharge per PA.

## 2011-12-31 NOTE — ED Notes (Signed)
Patient transported to X-ray 

## 2011-12-31 NOTE — ED Notes (Signed)
Wheezing and sob since last night

## 2012-01-08 ENCOUNTER — Ambulatory Visit (INDEPENDENT_AMBULATORY_CARE_PROVIDER_SITE_OTHER): Payer: Self-pay

## 2012-01-08 DIAGNOSIS — Z23 Encounter for immunization: Secondary | ICD-10-CM

## 2012-01-08 DIAGNOSIS — J45909 Unspecified asthma, uncomplicated: Secondary | ICD-10-CM

## 2012-01-08 MED ORDER — OMALIZUMAB 150 MG ~~LOC~~ SOLR
225.0000 mg | Freq: Once | SUBCUTANEOUS | Status: AC
  Administered 2012-01-08: 225 mg via SUBCUTANEOUS

## 2012-01-13 ENCOUNTER — Emergency Department (INDEPENDENT_AMBULATORY_CARE_PROVIDER_SITE_OTHER)
Admission: EM | Admit: 2012-01-13 | Discharge: 2012-01-13 | Disposition: A | Discharge status: Home / Self Care | Payer: Self-pay | Source: Home / Self Care

## 2012-01-13 ENCOUNTER — Ambulatory Visit: Payer: Self-pay | Admitting: Internal Medicine

## 2012-01-13 ENCOUNTER — Encounter (HOSPITAL_COMMUNITY): Payer: Self-pay | Admitting: *Deleted

## 2012-01-13 DIAGNOSIS — J45901 Unspecified asthma with (acute) exacerbation: Secondary | ICD-10-CM

## 2012-01-13 MED ORDER — IPRATROPIUM BROMIDE 0.02 % IN SOLN
0.5000 mg | Freq: Once | RESPIRATORY_TRACT | Status: AC
  Administered 2012-01-13: 0.5 mg via RESPIRATORY_TRACT

## 2012-01-13 MED ORDER — ALBUTEROL SULFATE (5 MG/ML) 0.5% IN NEBU
INHALATION_SOLUTION | RESPIRATORY_TRACT | Status: AC
  Filled 2012-01-13: qty 1

## 2012-01-13 MED ORDER — METHYLPREDNISOLONE SODIUM SUCC 125 MG IJ SOLR
INTRAMUSCULAR | Status: AC
  Filled 2012-01-13: qty 2

## 2012-01-13 MED ORDER — ALBUTEROL SULFATE (5 MG/ML) 0.5% IN NEBU
2.5000 mg | INHALATION_SOLUTION | Freq: Once | RESPIRATORY_TRACT | Status: AC
  Administered 2012-01-13: 2.5 mg via RESPIRATORY_TRACT

## 2012-01-13 MED ORDER — METHYLPREDNISOLONE SODIUM SUCC 125 MG IJ SOLR
125.0000 mg | Freq: Once | INTRAMUSCULAR | Status: AC
  Administered 2012-01-13: 125 mg via INTRAMUSCULAR

## 2012-01-13 MED ORDER — METHYLPREDNISOLONE 4 MG PO KIT: PACK | ORAL | Status: DC

## 2012-01-13 MED ORDER — METHYLPREDNISOLONE SODIUM SUCC 125 MG IJ SOLR: 125.0000 mg | Freq: Once | INTRAMUSCULAR | Status: DC

## 2012-01-13 NOTE — ED Notes (Signed)
Pt reports wheezing from her asthma the past 2  Weeks    She was treated at the onset at [facility] ED.  Today c/o continued wheezing and SOB not  Relieved  by her nebulizer at home     Wheezing heard all lung fields

## 2012-01-13 NOTE — ED Provider Notes (Signed)
History     CSN: [id number]  Arrival date & time 01/13/12  1538   None     Chief Complaint  Patient presents with  . Asthma    (Consider location/radiation/quality/duration/timing/severity/associated sxs/prior treatment) HPI  Past Medical History  Diagnosis Date  . Acanthosis nigricans   . Menorrhagia   . Asthma with exacerbation   . Hypertension, essential   . Pain, dental   . Insomnia   . Morbid obesity   . Helicobacter pylori (H. pylori) infection   . Sleep apnea   . COPD (chronic obstructive pulmonary disease)   . Asthma     skin test 02/01/09 pos hemorrhoids  . Depression   . GERD (gastroesophageal reflux disease)   . Right wrist pain   . Tobacco user   . Depression     Past Surgical History  Procedure Date  . Tubal ligation 1996    bilateral  . Breast reduction surgery 09/2010    Family History  Problem Relation Age of Onset  . Asthma Daughter   . Cancer Paternal Aunt   . Asthma Maternal Grandmother     History  Substance Use Topics  . Smoking status: Former Smoker    Types: Cigarettes    Quit date: 06/26/2007  . Smokeless tobacco: Never Used  . Alcohol Use: No    OB History    Grav Para Term Preterm Abortions TAB SAB Ect Mult Living                  Review of Systems  Allergies  Ace inhibitors; Percocet; and Zolpidem tartrate  Home Medications   Current Outpatient Rx  Name Route Sig Dispense Refill  . ALBUTEROL SULFATE HFA 108 (90 BASE) MCG/ACT IN AERS Inhalation Inhale 1-2 puffs into the lungs every 6 (six) hours as needed for wheezing. 1 Inhaler 0  . BUPROPION HCL ER (XL) 150 MG PO TB24 Oral Take 1 tablet (150 mg total) by mouth daily. 30 tablet 3  . FLUTICASONE-SALMETEROL 250-50 MCG/DOSE IN AEPB Inhalation Inhale 1 puff into the lungs 2 (two) times daily. Rinse mouth 3 each 3  . FLUTICASONE-SALMETEROL 250-50 MCG/DOSE IN AEPB Inhalation Inhale 1 puff into the lungs daily. 60 each 1  . HYDROCHLOROTHIAZIDE 50 MG PO TABS Oral Take  0.5 tablets (25 mg total) by mouth daily. 30 tablet 11  . MOMETASONE FURO-FORMOTEROL FUM 100-5 MCG/ACT IN AERO Inhalation Inhale 2 puffs into the lungs 2 (two) times daily. 1 Inhaler 09  . OMALIZUMAB 150 MG [place] SOLR  300 mg every 14 (fourteen) days.      . OMEPRAZOLE MAGNESIUM 20 MG PO TBEC Oral Take 20 mg by mouth 2 (two) times daily.    [name] VALSARTAN 160 MG PO TABS Oral Take 1 tablet (160 mg total) by mouth daily. 30 tablet 11  . DOCUSATE SODIUM 100 MG PO CAPS Oral Take 1 capsule (100 mg total) by mouth 2 (two) times daily as needed for constipation. 60 capsule 0  . EPINEPHRINE 0.3 MG/0.3ML IJ DEVI Intramuscular Inject 0.3 mg into the muscle as needed. For severe allergic reaction     . METHYLPREDNISOLONE 4 MG PO KIT  As directed 21 tablet 0  . MOMETASONE FURO-FORMOTEROL FUM 100-5 MCG/ACT IN AERO Inhalation Inhale 2 Act into the lungs 2 (two) times daily at 10 AM and 5 PM. Rinse mouth 1 Inhaler prn    BP 153/101  Pulse 97  Temp 98 F (36.7 C) (Oral)  Resp 22  SpO2 98%  LMP 01/10/2012  Physical Exam  ED Course  Procedures (including critical care time)  Labs Reviewed - No data to display No results found.   1. Asthma exacerbation       MDM   Results for orders placed in visit on 09/25/11  TB SKIN TEST      Component Value Range   TB Skin Test Negative     Induration 0    Duoneb on arrival Solumedrol 125mg  IM on arrival Pre peak flow 150, post 250 with subjective improvement; moderate improvement with air movement.  Repeat Albuterol neb 20 min post neb.          Hayden Rasmussen, NP 01/13/12 1739

## 2012-01-14 ENCOUNTER — Encounter: Payer: Self-pay | Admitting: Internal Medicine

## 2012-01-14 NOTE — ED Provider Notes (Signed)
Medical screening examination/treatment/procedure(s) were performed by resident physician or non-physician practitioner and as supervising physician I was immediately available for consultation/collaboration.   Fusako Tanabe DOUGLAS MD.    Chrystian Ressler D Bailei Buist, MD 01/14/12 1911 

## 2012-01-15 ENCOUNTER — Ambulatory Visit: Payer: Self-pay | Admitting: Internal Medicine

## 2012-01-23 ENCOUNTER — Telehealth: Payer: Self-pay | Admitting: Internal Medicine

## 2012-01-23 NOTE — Telephone Encounter (Signed)
Patient states she has never heard anything regarding pt assistance. [name], do you know anything about pt assistance forms for her [facility] or rescue inhaler?

## 2012-01-23 NOTE — Telephone Encounter (Signed)
Per Florentina Addison, the pt was supposed to call the pt assistance program because they were needing additional information from her. The pt states that she did this already but has not heard back from them.  per Florentina Addison, the pt had sent the assistance forms herself through the mail. The pt is going to contact the assistance program to see what the status of her application is.

## 2012-01-23 NOTE — Telephone Encounter (Signed)
Pt called back April [name]  

## 2012-01-23 NOTE — Telephone Encounter (Signed)
I do not have anything on this patient; we had talked about his previously and patient was aware she needed to call the patient assistance program about the medications.

## 2012-01-23 NOTE — Telephone Encounter (Signed)
  Per Florentina Addison, no samples are available. LMOMTCB x 1.

## 2012-01-23 NOTE — Telephone Encounter (Signed)
Pt called back again. April [name] °

## 2012-01-29 ENCOUNTER — Ambulatory Visit (INDEPENDENT_AMBULATORY_CARE_PROVIDER_SITE_OTHER): Payer: Self-pay

## 2012-01-29 DIAGNOSIS — J45909 Unspecified asthma, uncomplicated: Secondary | ICD-10-CM

## 2012-01-29 MED ORDER — OMALIZUMAB 150 MG ~~LOC~~ SOLR
225.0000 mg | Freq: Once | SUBCUTANEOUS | Status: AC
  Administered 2012-01-29: 225 mg via SUBCUTANEOUS

## 2012-01-31 ENCOUNTER — Telehealth: Payer: Self-pay | Admitting: Internal Medicine

## 2012-01-31 NOTE — Telephone Encounter (Signed)
Pt asked for samples of albuterol inhaler but we do not have any. Pt advised. Carron Curie, CMA

## 2012-02-05 ENCOUNTER — Ambulatory Visit: Payer: 59 | Admitting: Pulmonary Disease

## 2012-02-05 ENCOUNTER — Ambulatory Visit: Payer: 59 | Admitting: Internal Medicine

## 2012-02-17 ENCOUNTER — Telehealth: Payer: Self-pay | Admitting: Internal Medicine

## 2012-02-17 MED ORDER — ALBUTEROL SULFATE HFA 108 (90 BASE) MCG/ACT IN AERS: 1.0000 | INHALATION_SPRAY | Freq: Four times a day (QID) | RESPIRATORY_TRACT | Status: DC | PRN

## 2012-02-17 MED ORDER — MOMETASONE FURO-FORMOTEROL FUM 100-5 MCG/ACT IN AERO: 2.0000 | INHALATION_SPRAY | Freq: Two times a day (BID) | RESPIRATORY_TRACT | Status: DC

## 2012-02-17 NOTE — Telephone Encounter (Signed)
Pt wants samples of dulera and albuterol rescue inhaler. Please advise if okay to give samples. thanks

## 2012-02-17 NOTE — Telephone Encounter (Signed)
Per [facility] okay to give 1 sample of dulera and 1 of any rescue inhaler Samples up front for pick up ATC and inform the pt NA and no option to leave msg, [facility]

## 2012-02-18 ENCOUNTER — Ambulatory Visit (INDEPENDENT_AMBULATORY_CARE_PROVIDER_SITE_OTHER): Payer: Self-pay

## 2012-02-18 DIAGNOSIS — J45909 Unspecified asthma, uncomplicated: Secondary | ICD-10-CM

## 2012-02-18 NOTE — Telephone Encounter (Signed)
Pt aware.

## 2012-02-18 NOTE — Telephone Encounter (Signed)
ATC pt NA WCB. Called home # and was advised # was no longer valid. wcb

## 2012-02-18 NOTE — Telephone Encounter (Signed)
Pt wants samples please cb at [phone]

## 2012-02-19 ENCOUNTER — Ambulatory Visit: Payer: Self-pay

## 2012-02-19 MED ORDER — OMALIZUMAB 150 MG ~~LOC~~ SOLR
225.0000 mg | Freq: Once | SUBCUTANEOUS | Status: AC
  Administered 2012-02-19: 225 mg via SUBCUTANEOUS

## 2012-02-26 ENCOUNTER — Emergency Department (HOSPITAL_COMMUNITY): Payer: Self-pay

## 2012-02-26 ENCOUNTER — Inpatient Hospital Stay (HOSPITAL_COMMUNITY)
Admission: EM | Admit: 2012-02-26 | Discharge: 2012-02-27 | DRG: 203 | Disposition: A | Discharge status: Home / Self Care | Payer: MEDICAID | Attending: Internal Medicine | Admitting: Internal Medicine

## 2012-02-26 ENCOUNTER — Emergency Department (HOSPITAL_COMMUNITY)
Admission: EM | Admit: 2012-02-26 | Discharge: 2012-02-26 | Disposition: A | Discharge status: Home / Self Care | Payer: Self-pay | Attending: Emergency Medicine | Admitting: Emergency Medicine

## 2012-02-26 ENCOUNTER — Encounter (HOSPITAL_COMMUNITY): Payer: Self-pay | Admitting: Emergency Medicine

## 2012-02-26 ENCOUNTER — Emergency Department (HOSPITAL_COMMUNITY)
Admission: EM | Admit: 2012-02-26 | Discharge: 2012-02-26 | Disposition: A | Discharge status: Other Acute Inpatient Hospital | Payer: Self-pay

## 2012-02-26 DIAGNOSIS — R05 Cough: Secondary | ICD-10-CM | POA: Insufficient documentation

## 2012-02-26 DIAGNOSIS — Z8739 Personal history of other diseases of the musculoskeletal system and connective tissue: Secondary | ICD-10-CM | POA: Insufficient documentation

## 2012-02-26 DIAGNOSIS — R079 Chest pain, unspecified: Secondary | ICD-10-CM

## 2012-02-26 DIAGNOSIS — Z8659 Personal history of other mental and behavioral disorders: Secondary | ICD-10-CM | POA: Insufficient documentation

## 2012-02-26 DIAGNOSIS — Z87898 Personal history of other specified conditions: Secondary | ICD-10-CM | POA: Insufficient documentation

## 2012-02-26 DIAGNOSIS — Z79899 Other long term (current) drug therapy: Secondary | ICD-10-CM | POA: Insufficient documentation

## 2012-02-26 DIAGNOSIS — J45901 Unspecified asthma with (acute) exacerbation: Secondary | ICD-10-CM | POA: Insufficient documentation

## 2012-02-26 DIAGNOSIS — I10 Essential (primary) hypertension: Secondary | ICD-10-CM | POA: Insufficient documentation

## 2012-02-26 DIAGNOSIS — R51 Headache: Secondary | ICD-10-CM | POA: Insufficient documentation

## 2012-02-26 DIAGNOSIS — R059 Cough, unspecified: Secondary | ICD-10-CM | POA: Insufficient documentation

## 2012-02-26 DIAGNOSIS — K219 Gastro-esophageal reflux disease without esophagitis: Secondary | ICD-10-CM | POA: Diagnosis present

## 2012-02-26 DIAGNOSIS — R0789 Other chest pain: Secondary | ICD-10-CM | POA: Insufficient documentation

## 2012-02-26 DIAGNOSIS — F411 Generalized anxiety disorder: Secondary | ICD-10-CM | POA: Diagnosis present

## 2012-02-26 DIAGNOSIS — J4489 Other specified chronic obstructive pulmonary disease: Secondary | ICD-10-CM | POA: Insufficient documentation

## 2012-02-26 DIAGNOSIS — J45909 Unspecified asthma, uncomplicated: Secondary | ICD-10-CM | POA: Diagnosis present

## 2012-02-26 DIAGNOSIS — F3289 Other specified depressive episodes: Secondary | ICD-10-CM | POA: Diagnosis present

## 2012-02-26 DIAGNOSIS — Z8742 Personal history of other diseases of the female genital tract: Secondary | ICD-10-CM | POA: Insufficient documentation

## 2012-02-26 DIAGNOSIS — F329 Major depressive disorder, single episode, unspecified: Secondary | ICD-10-CM | POA: Diagnosis present

## 2012-02-26 DIAGNOSIS — Z8619 Personal history of other infectious and parasitic diseases: Secondary | ICD-10-CM | POA: Insufficient documentation

## 2012-02-26 DIAGNOSIS — Z872 Personal history of diseases of the skin and subcutaneous tissue: Secondary | ICD-10-CM | POA: Insufficient documentation

## 2012-02-26 DIAGNOSIS — G4730 Sleep apnea, unspecified: Secondary | ICD-10-CM | POA: Insufficient documentation

## 2012-02-26 DIAGNOSIS — J449 Chronic obstructive pulmonary disease, unspecified: Secondary | ICD-10-CM | POA: Insufficient documentation

## 2012-02-26 DIAGNOSIS — Z87891 Personal history of nicotine dependence: Secondary | ICD-10-CM

## 2012-02-26 DIAGNOSIS — R062 Wheezing: Secondary | ICD-10-CM | POA: Insufficient documentation

## 2012-02-26 DIAGNOSIS — R1110 Vomiting, unspecified: Secondary | ICD-10-CM | POA: Insufficient documentation

## 2012-02-26 LAB — POCT I-STAT 3, ART BLOOD GAS (G3+)
Bicarbonate: 20.4 mEq/L (ref 20.0–24.0)
pH, Arterial: 7.392 (ref 7.350–7.450)
pO2, Arterial: 83 mmHg (ref 80.0–100.0)

## 2012-02-26 MED ORDER — IPRATROPIUM BROMIDE 0.02 % IN SOLN
0.5000 mg | Freq: Once | RESPIRATORY_TRACT | Status: AC
  Administered 2012-02-26: 0.5 mg via RESPIRATORY_TRACT
  Filled 2012-02-26 (×2): qty 2.5

## 2012-02-26 MED ORDER — PREDNISONE 10 MG PO TABS: 10.0000 mg | ORAL_TABLET | Freq: Two times a day (BID) | ORAL | Status: DC

## 2012-02-26 MED ORDER — PANTOPRAZOLE SODIUM 20 MG PO TBEC
20.0000 mg | DELAYED_RELEASE_TABLET | Freq: Two times a day (BID) | ORAL | Status: DC
Start: 2012-02-27 — End: 2012-02-27
  Filled 2012-02-26: qty 1

## 2012-02-26 MED ORDER — ALBUTEROL SULFATE (5 MG/ML) 0.5% IN NEBU
5.0000 mg | INHALATION_SOLUTION | Freq: Once | RESPIRATORY_TRACT | Status: AC
  Administered 2012-02-26: 5 mg via RESPIRATORY_TRACT
  Filled 2012-02-26 (×2): qty 20

## 2012-02-26 MED ORDER — ALBUTEROL SULFATE HFA 108 (90 BASE) MCG/ACT IN AERS: 1.0000 | INHALATION_SPRAY | Freq: Four times a day (QID) | RESPIRATORY_TRACT | Status: DC | PRN

## 2012-02-26 MED ORDER — MAGNESIUM SULFATE 40 MG/ML IJ SOLN
2.0000 g | Freq: Once | INTRAMUSCULAR | Status: AC
  Administered 2012-02-26: 2 g via INTRAVENOUS
  Filled 2012-02-26: qty 50

## 2012-02-26 MED ORDER — PREDNISONE 20 MG PO TABS
60.0000 mg | ORAL_TABLET | Freq: Once | ORAL | Status: AC
  Administered 2012-02-26: 60 mg via ORAL
  Filled 2012-02-26: qty 3

## 2012-02-26 MED ORDER — FLUTICASONE-SALMETEROL 250-50 MCG/DOSE IN AEPB
1.0000 | INHALATION_SPRAY | Freq: Two times a day (BID) | RESPIRATORY_TRACT | Status: DC
  Filled 2012-02-26: qty 14

## 2012-02-26 MED ORDER — HYDROCHLOROTHIAZIDE 25 MG PO TABS
25.0000 mg | ORAL_TABLET | Freq: Every day | ORAL | Status: DC
  Administered 2012-02-26 – 2012-02-27 (×2): 25 mg via ORAL
  Filled 2012-02-26 (×2): qty 1

## 2012-02-26 MED ORDER — MOMETASONE FURO-FORMOTEROL FUM 100-5 MCG/ACT IN AERO
2.0000 | INHALATION_SPRAY | Freq: Two times a day (BID) | RESPIRATORY_TRACT | Status: DC
  Administered 2012-02-27: 2 via RESPIRATORY_TRACT
  Filled 2012-02-26 (×2): qty 8.8

## 2012-02-26 MED ORDER — IRBESARTAN 150 MG PO TABS
150.0000 mg | ORAL_TABLET | Freq: Every day | ORAL | Status: DC
  Administered 2012-02-27 (×2): 150 mg via ORAL
  Filled 2012-02-26 (×3): qty 1

## 2012-02-26 MED ORDER — METOCLOPRAMIDE HCL 5 MG/ML IJ SOLN
10.0000 mg | Freq: Once | INTRAMUSCULAR | Status: AC
Start: 2012-02-26 — End: 2012-02-26
  Administered 2012-02-26: 10 mg via INTRAMUSCULAR
  Filled 2012-02-26: qty 2

## 2012-02-26 MED ORDER — IPRATROPIUM BROMIDE 0.02 % IN SOLN: 0.5000 mg | RESPIRATORY_TRACT | Status: DC | PRN

## 2012-02-26 MED ORDER — ALBUTEROL SULFATE (5 MG/ML) 0.5% IN NEBU: 5.0000 mg | INHALATION_SOLUTION | RESPIRATORY_TRACT | Status: DC | PRN

## 2012-02-26 MED ORDER — PREDNISONE 20 MG PO TABS: 20.0000 mg | ORAL_TABLET | Freq: Two times a day (BID) | ORAL | Status: DC

## 2012-02-26 MED ORDER — SODIUM CHLORIDE 0.9 % IV SOLN
Freq: Once | INTRAVENOUS | Status: AC
  Administered 2012-02-26: 21:00:00 via INTRAVENOUS

## 2012-02-26 MED ORDER — BUDESONIDE-FORMOTEROL FUMARATE 160-4.5 MCG/ACT IN AERO
2.0000 | INHALATION_SPRAY | Freq: Two times a day (BID) | RESPIRATORY_TRACT | Status: DC
Start: 2012-02-26 — End: 2012-02-27
  Filled 2012-02-26: qty 6

## 2012-02-26 MED ORDER — ALBUTEROL (5 MG/ML) CONTINUOUS INHALATION SOLN
15.0000 mg/h | INHALATION_SOLUTION | RESPIRATORY_TRACT | Status: AC
  Administered 2012-02-26: 15 mg/h via RESPIRATORY_TRACT
  Filled 2012-02-26: qty 20

## 2012-02-26 MED ORDER — OMEPRAZOLE MAGNESIUM 20 MG PO TBEC: 20.0000 mg | DELAYED_RELEASE_TABLET | Freq: Two times a day (BID) | ORAL | Status: DC

## 2012-02-26 MED ORDER — ALBUTEROL SULFATE (5 MG/ML) 0.5% IN NEBU
5.0000 mg | INHALATION_SOLUTION | Freq: Once | RESPIRATORY_TRACT | Status: AC
  Administered 2012-02-26: 5 mg via RESPIRATORY_TRACT
  Filled 2012-02-26 (×2): qty 1

## 2012-02-26 MED ORDER — ALBUTEROL SULFATE HFA 108 (90 BASE) MCG/ACT IN AERS
2.0000 | INHALATION_SPRAY | RESPIRATORY_TRACT | Status: DC | PRN
  Administered 2012-02-26: 2 via RESPIRATORY_TRACT
  Filled 2012-02-26: qty 6.7

## 2012-02-26 MED ORDER — ACETAMINOPHEN 325 MG PO TABS
650.0000 mg | ORAL_TABLET | Freq: Once | ORAL | Status: AC
  Administered 2012-02-26: 650 mg via ORAL
  Filled 2012-02-26: qty 2

## 2012-02-26 MED ORDER — IBUPROFEN 800 MG PO TABS
800.0000 mg | ORAL_TABLET | Freq: Four times a day (QID) | ORAL | Status: DC | PRN
  Filled 2012-02-26: qty 1

## 2012-02-26 NOTE — ED Notes (Signed)
The pt has no wheezes at present she does not feel well

## 2012-02-26 NOTE — ED Notes (Signed)
Pt c/o SOB from asthma; pt sts seen here this am for same; pt sts no better and pain in chest with cough and HA

## 2012-02-26 NOTE — ED Notes (Signed)
PT. REPORTS PROGRESSING SOB / WHEEZING AND OCCASIONAL DRY COUGH ONSET YESTERDAY .

## 2012-02-26 NOTE — ED Notes (Signed)
The pt just returned from xray.  She  Has an increase in sob and she has wheezes through out her lungs fields coughing intermittently

## 2012-02-26 NOTE — ED Provider Notes (Addendum)
History     CSN: [id number]  Arrival date & time 02/26/12  1843   First MD Initiated Contact with Patient 02/26/12 1949      Chief Complaint  Patient presents with  . Asthma    (Consider location/radiation/quality/duration/timing/severity/associated sxs/prior treatment) HPI Comments: Patient with asthma seen this ED yesterday treated and DC home now back with worsening symptoms despite home treatments   Patient is a 41 y.o. female presenting with asthma. The history is provided by the patient.  Asthma This is a recurrent problem. The current episode started in the past 7 days. The problem has been gradually worsening. Associated symptoms include coughing. Pertinent negatives include no fever, sore throat, vomiting or weakness.    Past Medical History  Diagnosis Date  . Acanthosis nigricans   . Menorrhagia   . Asthma with exacerbation   . Hypertension, essential   . Pain, dental   . Insomnia   . Morbid obesity   . Helicobacter pylori (H. pylori) infection   . Sleep apnea   . COPD (chronic obstructive pulmonary disease)   . Asthma     skin test 02/01/09 pos hemorrhoids  . Depression   . GERD (gastroesophageal reflux disease)   . Right wrist pain   . Tobacco user   . Depression     Past Surgical History  Procedure Date  . Tubal ligation 1996    bilateral  . Breast reduction surgery 09/2010    Family History  Problem Relation Age of Onset  . Asthma Daughter   . Cancer Paternal Aunt   . Asthma Maternal Grandmother     History  Substance Use Topics  . Smoking status: Former Smoker    Types: Cigarettes    Quit date: 06/26/2007  . Smokeless tobacco: Never Used  . Alcohol Use: No    OB History    Grav Para Term Preterm Abortions TAB SAB Ect Mult Living                  Review of Systems  Constitutional: Negative for fever.  HENT: Negative for sore throat.   Respiratory: Positive for cough.   Gastrointestinal: Negative for vomiting.  Neurological:  Negative for weakness.    Allergies  Ace inhibitors; Percocet; and Zolpidem tartrate  Home Medications   No current outpatient prescriptions on file.  BP 174/94  Pulse 90  Temp 99.2 F (37.3 C) (Oral)  Resp 22  Ht 5\' 5"  (1.651 m)  Wt 332 lb 3.7 oz (150.7 kg)  BMI 55.29 kg/m2  SpO2 99%  LMP 02/07/2012  Physical Exam  Constitutional: She appears well-developed.  HENT:  Head: Normocephalic.  Eyes: Pupils are equal, round, and reactive to light.  Neck: Normal range of motion.  Cardiovascular: Normal rate.   Pulmonary/Chest: She is in respiratory distress. She has wheezes. She has no rales. She exhibits no tenderness.  Abdominal: Soft.  Musculoskeletal: Normal range of motion.  Neurological: She is alert.  Skin: Skin is warm. No rash noted.    ED Course  Procedures (including critical care time)  Labs Reviewed  POCT I-STAT 3, BLOOD GAS (G3+) - Abnormal; Notable for the following:    pCO2 arterial 33.5 (*)     Acid-base deficit 4.0 (*)     All other components within normal limits  BLOOD GAS, ARTERIAL  IGE  MRSA PCR SCREENING   Dg Chest 2 View  02/26/2012  *RADIOLOGY REPORT*  Clinical Data: Asthma attack.  Shortness of breath.  CHEST - 2  VIEW  Comparison: Two-view chest 12/31/2011.  Findings: The heart size is normal.  Mild perihilar bronchitic changes are present.  No focal airspace consolidation is evident. The visualized soft tissues and bony thorax are unremarkable.  IMPRESSION:  1.  Mild perihilar bronchitic changes as can be seen and reactive airways disease. 2.  No acute airspace disease.   Original Report Authenticated By: Jamesetta Orleans. MATTERN, M.D.      1. HYPERTENSION, ESSENTIAL NOS   2. Asthma exacerbation       MDM  Asthma exacerbation, we'll admit as patient has been to the emergency department 3 times in 3 days, without relief of her symptoms        Arman Filter, NP 02/27/12 0211  Arman Filter, NP 03/22/12 2106

## 2012-02-26 NOTE — ED Notes (Signed)
Walked patient in hall on room air. O2 sats remained between 95-100%. Pt states she does not feel SOB and pt does not appear to be SOB.

## 2012-02-26 NOTE — ED Notes (Signed)
RT at bedside to draw ABG

## 2012-02-26 NOTE — H&P (Signed)
Hospital Admission Note Date: 02/27/2012  Patient name: April [name] Medical record number: [id number] Date of birth: 1971/03/06 Age: 41 y.o. Gender: female PCP: Carrolyn Meiers, MD  Internal Medicine Teaching Service  Attending physician:  Dr. Eben Burow     Internal Medicine Teaching Service Contact Information  1st Contact: Dr. Darden Palmer Pager: [phone] 2nd Contact: Dr. Lorretta Harp Pager:[phone]  After 5 pm or weekends: 1st Contact: Pager: [phone] 2nd Contact: Pager: [phone]   Chief Complaint: shortness of breath  History of Present Illness:  Ms Saman is a 41 yo lady with a history of recurrent asthma, anxiety, depression, who presents with 3 days of worsening shortness of breath, CP, cough, and wheezing. She was evaluated in the ED earlier today but returned with similar symptoms several hours later despite using inhalers. She was given a Rx for prednisone which she did not fill. She has been using her inhalers 3-4 times per hour. Cough is nonproductive, chest discomfort is in the center of her chest, worse with coughing. She states that the SOB is worse at night. She did vomit twice on Sunday, but none since then. No diarrhea, constipation, no sore throat. + sick contact, took care of her nephew with a URI.  ROS + for headache.  Former smoker.  Meds:   Medication List     As of 02/27/2012  2:39 AM    ASK your doctor about these medications         albuterol 108 (90 BASE) MCG/ACT inhaler   Commonly known as: PROVENTIL HFA;VENTOLIN HFA   Inhale 1-2 puffs into the lungs every 6 (six) hours as needed for wheezing.      budesonide-formoterol 160-4.5 MCG/ACT inhaler   Commonly known as: SYMBICORT   Inhale 2 puffs into the lungs 2 (two) times daily.      EPIPEN 0.3 mg/0.3 mL Devi   Generic drug: EPINEPHrine   Inject 0.3 mg into the muscle as needed. For severe allergic reaction      Fluticasone-Salmeterol 250-50 MCG/DOSE Aepb   Commonly known as: ADVAIR   Inhale 1 puff into  the lungs 2 (two) times daily. Rinse mouth      hydrochlorothiazide 50 MG tablet   Commonly known as: HYDRODIURIL   Take 0.5 tablets (25 mg total) by mouth daily.      ibuprofen 200 MG tablet   Commonly known as: ADVIL,MOTRIN   Take 800 mg by mouth every 6 (six) hours as needed. For pain      mometasone-formoterol 100-5 MCG/ACT Aero   Commonly known as: DULERA   Inhale 2 puffs into the lungs 2 (two) times daily.      omeprazole 20 MG tablet   Commonly known as: PRILOSEC OTC   Take 20 mg by mouth 2 (two) times daily.      predniSONE 10 MG tablet   Commonly known as: DELTASONE   Take 1 tablet (10 mg total) by mouth 2 (two) times daily.      valsartan 160 MG tablet   Commonly known as: DIOVAN   Take 1 tablet (160 mg total) by mouth daily.          Allergies: Allergies as of 02/26/2012 - Review Complete 02/26/2012  Allergen Reaction Noted  . Ace inhibitors  11/24/2009  . Percocet (oxycodone-acetaminophen) Itching 01/13/2012  . Zolpidem tartrate  05/28/2011   Past Medical History  Diagnosis Date  . Acanthosis nigricans   . Menorrhagia   . Asthma with exacerbation   . Hypertension, essential   .  Pain, dental   . Insomnia   . Morbid obesity   . Helicobacter pylori (H. pylori) infection   . Sleep apnea   . COPD (chronic obstructive pulmonary disease)   . Asthma     skin test 02/01/09 pos hemorrhoids  . Depression   . GERD (gastroesophageal reflux disease)   . Right wrist pain   . Tobacco user   . Depression    Past Surgical History  Procedure Date  . Tubal ligation 1996    bilateral  . Breast reduction surgery 09/2010   Family History  Problem Relation Age of Onset  . Asthma Daughter   . Cancer Paternal Aunt   . Asthma Maternal Grandmother    History   Social History  . Marital Status: Single    Spouse Name: N/A    Number of Children: 1  . 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  Smoking status: Former Smoker    Types: Cigarettes    Quit date: 06/26/2007  . Smokeless tobacco: Never Used  . Alcohol Use: No  . Drug Use: No  . Sexually Active: Not on file   Other Topics Concern  . 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     Review of Systems: Pertinent items noted in HPI   Physical Exam Blood pressure 174/94, pulse 90, temperature 99.2 F (37.3 C), temperature source Oral, resp. rate 22, height 5\' 5"  (1.651 m), weight 332 lb 3.7 oz (150.7 kg), last menstrual period 02/07/2012, SpO2 99.00%. on RA General:  Obese, no acute distress, alert and oriented x 3, well-appearing  HEENT:  PERRL, EOMI, moist mucous membranes Cardiovascular:  Regular rate and rhythm, no murmurs Respiratory:  Expiratory wheezes throughout, good effort Abdomen:  Soft, nondistended, nontender, +bowel sounds Extremities:  Warm and well-perfused, trace edema Skin: Warm, dry, no rashes Neuro: Normal affect  ABG  pH, Arterial 7.392 pCO2 arterial 33.5 pO2, Arterial 83.0  Imaging results:  Dg Chest 2 View  02/26/2012  *RADIOLOGY REPORT*  Clinical Data: Asthma attack.  Shortness of breath.  CHEST - 2 VIEW  Comparison: Two-view chest 12/31/2011.  Findings: The heart size is normal.  Mild perihilar bronchitic changes are present.  No focal airspace consolidation is evident. The visualized soft tissues and bony thorax are unremarkable.  IMPRESSION:  1.  Mild perihilar bronchitic changes as can be seen and reactive airways disease. 2.  No acute airspace disease.   Original Report Authenticated By: Jamesetta Orleans. MATTERN, M.D.      Assessment & Plan by Problem: Principal Problem:  *Extrinsic asthma with exacerbation Active Problems:  HYPERTENSION, ESSENTIAL NOS  Esophageal reflux   Asthma exacerbation Pt presents with worsening SOB since Sunday, wheezing  on exam. No O2 requirement. Multiple ED visits for asthma, most recently on 12/31/11, given albuterol negs and home inhalers. Followed by pulmonologist Dr. Maple Hudson. On omalizumab at home. CXR reveals mild perihilar bronchitis changes with reactive airway disease. No focal consolidation, no pneumothorax. ABG with pH 7.392 pCO2 33.5 pO2 83 ikely asthma exacerbation. Also consider URI. No pna on CXR. Undertreated GERD may exacerbate symptoms  -solumedrol 60 mg QD, then switch to prednisone tomorrow -duonebs q6, dulera -ambulate with pulse ox -claritin -scheduled BID protonix -check CBC diff, BMP, IgE  Hypertension Patient  hypertensive in the ED, did not take medications today.  -cont home valsartan, HCTZ  GERD -scheduled protonix BID  PPX -lovenox  Diet -regular  Signed: Denton Ar 02/27/2012, 2:39 AM

## 2012-02-26 NOTE — ED Notes (Signed)
Gave pt instructions on albuterol inhaler and discharge instructions.

## 2012-02-26 NOTE — ED Provider Notes (Signed)
History     CSN: [id number]  Arrival date & time 02/26/12  0057   First MD Initiated Contact with Patient 02/26/12 0143      No chief complaint on file.   (Consider location/radiation/quality/duration/timing/severity/associated sxs/prior treatment) HPI History provided by pt.   Pt presents w/ c/o asthma exacerbation x 2 days.  Sx acutely worsened yesterday and nebulizer treatments provide only temporary relief.  She is experiencing typical CP, SOB, cough and wheezing.  Associated w/ severe frontal headache, intermittent lightheadedness that occurs when she's coughing and an episode of vomiting yesterday evening.  Denies fever, vision changes, abd pain, diarrhea and urinary sx.  Has not taken anything for headache.  Denies head trauma, nasal congestion, rhinorrhea and sore throat.  Past Medical History  Diagnosis Date  . Acanthosis nigricans   . Menorrhagia   . Asthma with exacerbation   . Hypertension, essential   . Pain, dental   . Insomnia   . Morbid obesity   . Helicobacter pylori (H. pylori) infection   . Sleep apnea   . COPD (chronic obstructive pulmonary disease)   . Asthma     skin test 02/01/09 pos hemorrhoids  . Depression   . GERD (gastroesophageal reflux disease)   . Right wrist pain   . Tobacco user   . Depression     Past Surgical History  Procedure Date  . Tubal ligation 1996    bilateral  . Breast reduction surgery 09/2010    Family History  Problem Relation Age of Onset  . Asthma Daughter   . Cancer Paternal Aunt   . Asthma Maternal Grandmother     History  Substance Use Topics  . Smoking status: Former Smoker    Types: Cigarettes    Quit date: 06/26/2007  . Smokeless tobacco: Never Used  . Alcohol Use: No    OB History    Grav Para Term Preterm Abortions TAB SAB Ect Mult Living                  Review of Systems  All other systems reviewed and are negative.    Allergies  Ace inhibitors; Percocet; and Zolpidem tartrate  Home  Medications   Current Outpatient Rx  Name Route Sig Dispense Refill  . ALBUTEROL SULFATE HFA 108 (90 BASE) MCG/ACT IN AERS Inhalation Inhale 1-2 puffs into the lungs every 6 (six) hours as needed for wheezing. 1 Inhaler 0  . EPINEPHRINE 0.3 MG/0.3ML IJ DEVI Intramuscular Inject 0.3 mg into the muscle as needed. For severe allergic reaction     . FLUTICASONE-SALMETEROL 250-50 MCG/DOSE IN AEPB Inhalation Inhale 1 puff into the lungs 2 (two) times daily. Rinse mouth 3 each 3  . HYDROCHLOROTHIAZIDE 50 MG PO TABS Oral Take 0.5 tablets (25 mg total) by mouth daily. 30 tablet 11  . MOMETASONE FURO-FORMOTEROL FUM 100-5 MCG/ACT IN AERO Inhalation Inhale 2 puffs into the lungs 2 (two) times daily. 1 Inhaler 0  . OMALIZUMAB 150 MG [place] SOLR  300 mg every 14 (fourteen) days.      . OMEPRAZOLE MAGNESIUM 20 MG PO TBEC Oral Take 20 mg by mouth 2 (two) times daily.    [name] VALSARTAN 160 MG PO TABS Oral Take 1 tablet (160 mg total) by mouth daily. 30 tablet 11    BP 135/90  Pulse 82  Temp 99.2 F (37.3 C) (Oral)  SpO2 100%  LMP 02/03/2012  Physical Exam  Nursing note and vitals reviewed. Constitutional: She is oriented to person, place,  and time. She appears well-developed and well-nourished. No distress.  HENT:  Head: Normocephalic and atraumatic.  Eyes:       Normal appearance  Neck: Normal range of motion.       No meningeal signs  Cardiovascular: Normal rate, regular rhythm and intact distal pulses.   Pulmonary/Chest: Effort normal and breath sounds normal.       Diminished breath sounds at lung bases.  Expiratory upper airway noise but no wheezing.  Coughing.  Musculoskeletal: Normal range of motion.  Neurological: She is alert and oriented to person, place, and time. No sensory deficit. Coordination normal.       CN 3-12 intact.  No nystagmus. 5/5 and equal upper and lower extremity strength.  No past pointing.     Skin: Skin is warm and dry. No rash noted.  Psychiatric: She has a normal mood  and affect. Her behavior is normal.    ED Course  Procedures (including critical care time)  Labs Reviewed - No data to display No results found.   1. Asthma exacerbation   2. Headache       MDM  41yo F presents w/ c/o typical asthma exacerbation as well as headache.  On exam, pt well-appearing, no respiratory distress, afebrile, diminished breath sounds lung bases, coughing, no focal neuro deficits or meningeal signs.  No relief after first neb but sx improved after continuous (pt received po prednisone as well).  Breath sounds improved on re-examination.  She is ambulating w/out dropping sats or becoming symptomatic.  Headache resolved w/ IM reglan.  Pt received an albuterol inhaler and d/c'd home w/ 5d course of prednisone.  She is aware that her BP is elevated; has not taken PM dose of anti-hypertensive and will when she gets home.  Return precautions discussed.     Otilio Miu, [place] 02/26/12 [phone]

## 2012-02-26 NOTE — ED Provider Notes (Signed)
Medical screening examination/treatment/procedure(s) were performed by non-physician practitioner and as supervising physician I was immediately available for consultation/collaboration.  Olivia Mackie, MD 02/26/12 [phone]

## 2012-02-26 NOTE — ED Notes (Signed)
The pt has asthma and she has had increased coughing with chest congestion and pain.  hhn given on arrival to the triage area

## 2012-02-26 NOTE — ED Notes (Signed)
Pt states cough started yesterday and worsened throughout day, SOB and wheezing became worse throughout day also. C/o pain in chest that started yesterday with cough and pain worsens when coughing or taking deep breaths.

## 2012-02-27 ENCOUNTER — Encounter (HOSPITAL_COMMUNITY): Payer: Self-pay | Admitting: *Deleted

## 2012-02-27 DIAGNOSIS — K219 Gastro-esophageal reflux disease without esophagitis: Secondary | ICD-10-CM

## 2012-02-27 DIAGNOSIS — I10 Essential (primary) hypertension: Secondary | ICD-10-CM

## 2012-02-27 DIAGNOSIS — J45901 Unspecified asthma with (acute) exacerbation: Secondary | ICD-10-CM

## 2012-02-27 LAB — COMPREHENSIVE METABOLIC PANEL
BUN: 8 mg/dL (ref 6–23)
CO2: 21 mEq/L (ref 19–32)
Calcium: 9.4 mg/dL (ref 8.4–10.5)
Chloride: 102 mEq/L (ref 96–112)
Creatinine, Ser: 0.65 mg/dL (ref 0.50–1.10)
GFR calc Af Amer: >90 mL/min (ref >90)
GFR calc non Af Amer: >90 mL/min (ref >90)
Total Bilirubin: 0.1 mg/dL — ABNORMAL LOW (ref 0.3–1.2)

## 2012-02-27 LAB — CBC WITH DIFFERENTIAL/PLATELET
Basophils Absolute: 0 10*3/uL (ref 0.0–0.1)
Eosinophils Relative: 0 % (ref 0–5)
Lymphocytes Relative: 12 % (ref 12–46)
Lymphs Abs: 1.5 10*3/uL (ref 0.7–4.0)
Neutro Abs: 10.4 10*3/uL — ABNORMAL HIGH (ref 1.7–7.7)
Neutrophils Relative %: 85 % — ABNORMAL HIGH (ref 43–77)
Platelets: 351 10*3/uL (ref 150–400)
RBC: 4.73 MIL/uL (ref 3.87–5.11)
RDW: 16.1 % — ABNORMAL HIGH (ref 11.5–15.5)
WBC: 12.1 10*3/uL — ABNORMAL HIGH (ref 4.0–10.5)

## 2012-02-27 LAB — MRSA PCR SCREENING: MRSA by PCR: NEGATIVE

## 2012-02-27 LAB — MAGNESIUM: Magnesium: 2.4 mg/dL (ref 1.5–2.5)

## 2012-02-27 LAB — IGE: IgE (Immunoglobulin E), Serum: 281.8 IU/mL — ABNORMAL HIGH (ref 0.0–180.0)

## 2012-02-27 MED ORDER — LORATADINE 10 MG PO TABS: 10.0000 mg | ORAL_TABLET | Freq: Every day | ORAL | Status: DC

## 2012-02-27 MED ORDER — ALBUTEROL SULFATE HFA 108 (90 BASE) MCG/ACT IN AERS: 1.0000 | INHALATION_SPRAY | RESPIRATORY_TRACT | Status: DC | PRN

## 2012-02-27 MED ORDER — DOXYCYCLINE HYCLATE 50 MG PO CAPS: 100.0000 mg | ORAL_CAPSULE | Freq: Two times a day (BID) | ORAL | Status: AC

## 2012-02-27 MED ORDER — ALBUTEROL SULFATE (5 MG/ML) 0.5% IN NEBU: 2.5000 mg | INHALATION_SOLUTION | RESPIRATORY_TRACT | Status: DC | PRN

## 2012-02-27 MED ORDER — IPRATROPIUM BROMIDE 0.02 % IN SOLN: 0.5000 mg | RESPIRATORY_TRACT | Status: DC | PRN

## 2012-02-27 MED ORDER — METHYLPREDNISOLONE SODIUM SUCC 125 MG IJ SOLR
60.0000 mg | Freq: Once | INTRAMUSCULAR | Status: AC
  Administered 2012-02-27: 60 mg via INTRAVENOUS
  Filled 2012-02-27: qty 0.96

## 2012-02-27 MED ORDER — IPRATROPIUM BROMIDE 0.02 % IN SOLN
0.5000 mg | Freq: Four times a day (QID) | RESPIRATORY_TRACT | Status: DC
  Administered 2012-02-27 (×2): 0.5 mg via RESPIRATORY_TRACT
  Filled 2012-02-27 (×2): qty 2.5

## 2012-02-27 MED ORDER — ONDANSETRON HCL 4 MG/2ML IJ SOLN: 4.0000 mg | Freq: Four times a day (QID) | INTRAMUSCULAR | Status: DC | PRN

## 2012-02-27 MED ORDER — ACETAMINOPHEN 650 MG RE SUPP: 650.0000 mg | Freq: Four times a day (QID) | RECTAL | Status: DC | PRN

## 2012-02-27 MED ORDER — HYDROCODONE-ACETAMINOPHEN 5-325 MG PO TABS: 1.0000 | ORAL_TABLET | ORAL | Status: DC | PRN

## 2012-02-27 MED ORDER — TIOTROPIUM BROMIDE MONOHYDRATE 18 MCG IN CAPS: 18.0000 ug | ORAL_CAPSULE | Freq: Every day | RESPIRATORY_TRACT | Status: DC

## 2012-02-27 MED ORDER — SODIUM CHLORIDE 0.9 % IV SOLN
INTRAVENOUS | Status: DC
  Administered 2012-02-27: 03:00:00 via INTRAVENOUS

## 2012-02-27 MED ORDER — TRAZODONE HCL 50 MG PO TABS
50.0000 mg | ORAL_TABLET | Freq: Every evening | ORAL | Status: DC | PRN
  Filled 2012-02-27: qty 1

## 2012-02-27 MED ORDER — ALBUTEROL SULFATE HFA 108 (90 BASE) MCG/ACT IN AERS: 2.0000 | INHALATION_SPRAY | Freq: Four times a day (QID) | RESPIRATORY_TRACT | Status: DC | PRN

## 2012-02-27 MED ORDER — PREDNISONE 50 MG PO TABS
60.0000 mg | ORAL_TABLET | Freq: Every day | ORAL | Status: DC
  Filled 2012-02-27: qty 1

## 2012-02-27 MED ORDER — SODIUM CHLORIDE 0.9 % IJ SOLN
3.0000 mL | Freq: Two times a day (BID) | INTRAMUSCULAR | Status: DC
  Administered 2012-02-27 (×2): 3 mL via INTRAVENOUS

## 2012-02-27 MED ORDER — METOPROLOL SUCCINATE ER 50 MG PO TB24: 50.0000 mg | ORAL_TABLET | Freq: Every day | ORAL | Status: DC

## 2012-02-27 MED ORDER — ALBUTEROL SULFATE (5 MG/ML) 0.5% IN NEBU
2.5000 mg | INHALATION_SOLUTION | RESPIRATORY_TRACT | Status: DC
Start: 2012-02-27 — End: 2012-02-27

## 2012-02-27 MED ORDER — ACETAMINOPHEN 325 MG PO TABS: 650.0000 mg | ORAL_TABLET | Freq: Four times a day (QID) | ORAL | Status: DC | PRN

## 2012-02-27 MED ORDER — LORATADINE 10 MG PO TABS
10.0000 mg | ORAL_TABLET | Freq: Every day | ORAL | Status: DC
  Administered 2012-02-27: 10 mg via ORAL
  Filled 2012-02-27: qty 1

## 2012-02-27 MED ORDER — ALBUTEROL SULFATE HFA 108 (90 BASE) MCG/ACT IN AERS: 1.0000 | INHALATION_SPRAY | Freq: Four times a day (QID) | RESPIRATORY_TRACT | Status: DC | PRN

## 2012-02-27 MED ORDER — PANTOPRAZOLE SODIUM 40 MG PO TBEC
40.0000 mg | DELAYED_RELEASE_TABLET | Freq: Two times a day (BID) | ORAL | Status: DC
  Administered 2012-02-27: 40 mg via ORAL
  Filled 2012-02-27: qty 1

## 2012-02-27 MED ORDER — ALBUTEROL SULFATE (5 MG/ML) 0.5% IN NEBU
2.5000 mg | INHALATION_SOLUTION | Freq: Four times a day (QID) | RESPIRATORY_TRACT | Status: DC
  Administered 2012-02-27 (×2): 2.5 mg via RESPIRATORY_TRACT
  Filled 2012-02-27 (×2): qty 0.5

## 2012-02-27 MED ORDER — PREDNISONE 20 MG PO TABS: ORAL_TABLET | ORAL | Status: DC

## 2012-02-27 MED ORDER — ALBUTEROL SULFATE HFA 108 (90 BASE) MCG/ACT IN AERS
1.0000 | INHALATION_SPRAY | Freq: Four times a day (QID) | RESPIRATORY_TRACT | Status: DC | PRN
  Filled 2012-02-27 (×2): qty 6.7

## 2012-02-27 MED ORDER — ONDANSETRON HCL 4 MG PO TABS: 4.0000 mg | ORAL_TABLET | Freq: Four times a day (QID) | ORAL | Status: DC | PRN

## 2012-02-27 MED ORDER — ENOXAPARIN SODIUM 40 MG/0.4ML ~~LOC~~ SOLN
40.0000 mg | SUBCUTANEOUS | Status: DC
  Administered 2012-02-27: 40 mg via SUBCUTANEOUS
  Filled 2012-02-27 (×2): qty 0.4

## 2012-02-27 MED ORDER — IPRATROPIUM BROMIDE 0.02 % IN SOLN
0.5000 mg | RESPIRATORY_TRACT | Status: DC
Start: 2012-02-27 — End: 2012-02-27

## 2012-02-27 NOTE — Progress Notes (Signed)
Discharge instructions given. Pt verbalized understanding. Inhalers, abx, and script given and signed copy of discharge paperwork. PIV removed. VSS. No signs of resp. Distress.  

## 2012-02-27 NOTE — Care Management Note (Signed)
  Page 1 of 1   02/27/2012     11:21:34 AM   CARE MANAGEMENT NOTE 02/27/2012  Patient:  April [name], April [name]   Account Number:  [id number]  Date Initiated:  02/27/2012  Documentation initiated by:  Donn Pierini  Subjective/Objective Assessment:   Pt admitted with Asthma exacerbation     Action/Plan:   PTA pt lived at home - independent   Anticipated DC Date:  02/27/2012   Anticipated DC Plan:  HOME/SELF CARE      DC Planning Services  CM consult  Medication Assistance      Choice offered to / List presented to:             Status of service:  Completed, signed off Medicare Important Message given?   (If response is "NO", the following Medicare IM given date fields will be blank) Date Medicare IM given:   Date Additional Medicare IM given:    Discharge Disposition:    Per UR Regulation:  Reviewed for med. necessity/level of care/duration of stay  If discussed at Long Length of Stay Meetings, dates discussed:    Comments:  PCP- IM Clinic- Dr. Anselm Jungling  02/27/12- 1100- Donn Pierini RN, BSN [phone] Pt for discharge today- spoke with pt at bedside- per conversation pt states that she sees Dr. Anselm Jungling at the [facility] IM clinics- she has had an orange card in the past- explained to pt info on orange card and that she needed to f/u with clinic to see if she was eligible for the orange card at this time- pt to f/u when she goes for f/u visit. Per pharmacy pt is eligible for medication assistance with the indigent fund- CM to assist pt with doxy-abx-explained to pt that she can get her prednisone taper at [facility] for $4.- pt states that she can handle the $4 copay for the prednisone. Pt asked about her rescue inhaler- RN to ask MD about inhaler for discharge.

## 2012-02-27 NOTE — Progress Notes (Signed)
Utilization review completed.  

## 2012-02-27 NOTE — H&P (Signed)
Internal Medicine Attending Admission Note Date: 02/27/2012  Patient name: April [name] Medical record number: [id number] Date of birth: 09-30-1970 Age: 41 y.o. Gender: female  I saw and evaluated the patient. I reviewed the resident's note and I agree with the resident's findings and plan as documented in the resident's note.  Chief Complaint(s): Shortness of breath  History - key components related to admission: Patient is a 41 year old female with past medical history of asthma, generalized anxiety disorder, depression and hypertension presented with worsening shortness of breath associated with cough and wheezing. The shunt was evaluated in the ER on the day of admission but was discharged after initial treatments. Patient came back with worsening shortness of breath and was admitted. Patient states that she has been using her inhalers more frequently over last 2-3 days. Shortness of breath was associated with cough and whitish sputum.   16 point review of system unremarkable otherwise.   Physical Exam - key components related to admission:  Filed Vitals:   02/27/12 0347 02/27/12 0400 02/27/12 0738 02/27/12 0747  BP: 156/81   158/88  Pulse: 95 94    Temp: 98.2 F (36.8 C)   98.1 F (36.7 C)  TempSrc: Oral   Oral  Resp: 21 24  24   Height:      Weight:      SpO2: 98% 98% 98% 98%   patient was sitting in bed comfortably without any acute distress Respiratory: Clear to auscultation bilaterally without any wheezing or rhonchi or rales Cardiovascular: Regular rate and rhythm with no murmur rubs or gallops, S1-S2 normal Abdomen : Soft, distended due to fat, no organomegaly bowel sounds present in all 4 quadrants  Extremities: No edema noted  Lab results:   Basic Metabolic Panel:  [facility] 02/27/12 0435  NA 135  K 3.8  CL 102  CO2 21  GLUCOSE 170*  BUN 8  CREATININE 0.65  CALCIUM 9.4  MG 2.4  PHOS --   Liver Function Tests:  [facility] 02/27/12 0435  AST 22  ALT 20    ALKPHOS 71  BILITOT 0.1*  PROT 8.2  ALBUMIN 3.6   CBC:  [facility] 02/27/12 0435  WBC 12.1*  NEUTROABS 10.4*  HGB 13.4  HCT 40.0  MCV 84.6  PLT 351    Imaging results:  Dg Chest 2 View  02/26/2012 IMPRESSION:  1.  Mild perihilar bronchitic changes as can be seen and reactive airways disease. 2.  No acute airspace disease.   Original Report Authenticated By: Jamesetta Orleans. MATTERN, M.D.     Other results: EKG: No ST or T wave changes noted  Assessment & Plan by Problem:  Principal Problem:  *Extrinsic asthma with exacerbation Active Problems:  HYPERTENSION, ESSENTIAL NOS  Esophageal reflux  Patient is a 41 year old female with past medical history most significant for recurrent asthma exacerbations, hypertension and reflux disease who was admitted with asthma exacerbation. Patient was started on adequate therapy which included IV steroids, inhaled duo nebs and home inhaled steroids. Patient told me that she usually gets discharged home on 10 day course of doxycycline in similar situations in the past. She already feels much better and wants to go home. On my physical exam I could not hear any wheezing and I believe that patient should be able to go home on a prednisone taper and 10 day course of doxycycline. Patient is also requesting sample of her inhalers. We will provide them from clinic and discharge her home today.  Lars Mage MD Pager [phone]

## 2012-02-27 NOTE — Progress Notes (Signed)
Pt admitted into room 3302 with belongings via stretcher. VSS and no complaints of pain. Fiance is at bedside. Dr. Collier Bullock notified that pt is here, new orders received for regular diet. Will continue to monitor.  [place] Bryttney Netzer, RN

## 2012-02-28 NOTE — Discharge Summary (Signed)
Internal Medicine Teaching [facility] Discharge Note  Name: April [name] MRN: [id number] DOB: November 14, 1970 41 y.o.  Date of Admission: 02/26/2012  7:21 PM Date of Discharge: 02/28/2012 Attending Physician: Dr. Lars Mage Discharge Diagnosis: Principal Problem:  *Extrinsic asthma with exacerbation Active Problems:  HYPERTENSION, ESSENTIAL NOS  Esophageal reflux  Discharge Medications:   Medication List     As of 02/28/2012  9:14 PM    TAKE these medications            albuterol 108 (90 BASE) MCG/ACT inhaler   Commonly known as: PROVENTIL HFA;VENTOLIN HFA   Inhale 1-2 puffs into the lungs every 6 (six) hours as needed for wheezing or shortness of breath.         doxycycline 50 MG capsule   Commonly known as: VIBRAMYCIN   Take 2 capsules (100 mg total) by mouth 2 (two) times daily.      EPIPEN 0.3 mg/0.3 mL Devi   Generic drug: EPINEPHrine   Inject 0.3 mg into the muscle as needed. For severe allergic reaction      Fluticasone-Salmeterol 250-50 MCG/DOSE Aepb   Commonly known as: ADVAIR   Inhale 1 puff into the lungs 2 (two) times daily. Rinse mouth      hydrochlorothiazide 50 MG tablet   Commonly known as: HYDRODIURIL   Take 0.5 tablets (25 mg total) by mouth daily.      ibuprofen 200 MG tablet   Commonly known as: ADVIL,MOTRIN   Take 800 mg by mouth every 6 (six) hours as needed. For pain      loratadine 10 MG tablet   Commonly known as: CLARITIN   Take 1 tablet (10 mg total) by mouth daily.         omeprazole 20 MG tablet   Commonly known as: PRILOSEC OTC   Take 20 mg by mouth 2 (two) times daily.      predniSONE 20 MG tablet   Commonly known as: DELTASONE   Take 3 tablets 60mg  total today (10/31) and tomorrow (11/1), then 40mg  (2 tablets) daily for 3 days, then 20mg  (1 tablet) daily for 3 days, last day 03/05/12      valsartan 160 MG tablet   Commonly known as: DIOVAN   Take 1 tablet (160 mg total) by mouth daily.       Disposition and follow-up:     AprilCecilie EMMAJEAN RATLEDGE was discharged from [facility] in stable condition.  At the hospital follow up visit please address: -completion of prednisone taper and 10 day course of doxycycline and possible need for sample inhalers which she is unable to afford.   Follow-up Appointments:     Follow-up Information    Follow up with Dow Adolph, MD. On 03/02/2012. (@330pm )    Contact information:   [address] [place] [address] [phone]         Discharge Orders    Future Appointments: Provider: Department: Dept Phone: Center:   03/02/2012 8:45 AM Dow Adolph, MD [facility] [phone] [facility]   03/03/2012 2:30 PM [facility] [phone] None     Future Orders Please Complete By Expires   Diet - low sodium heart healthy      Increase activity slowly      Discharge instructions      Comments:   Please take Prednisone by mouth total of 60mg  (3 tablets) daily for today (02/27/12) and tomorrow (02/28/12), then take 40mg  total (2 tablets)  daily  For the next three days (11/2-11/4/13) and then take 20mg  total (1 tablet) daily for three days (11/5-11/7/13)  Please take your antibiotics: Doxycycline 100mg  twice a day for ten days as prescribed  Please return to clinic for follow up appointment in [facility] Dr. Zada Girt 03/02/12 @330pm   If symptoms worsen, call or come to clinic or if severe, go to emergency room   Call MD for:  temperature >100.4      Call MD for:  difficulty breathing, headache or visual disturbances        Procedures Performed:  Dg Chest 2 View 02/26/2012  *RADIOLOGY REPORT*  Clinical Data: Asthma attack.  Shortness of breath.  CHEST - 2 VIEW  Comparison: Two-view chest 12/31/2011.  Findings: The heart size is normal.  Mild perihilar bronchitic changes are present.  No focal airspace consolidation is evident. The visualized soft tissues and bony thorax are unremarkable.  IMPRESSION:  1.  Mild  perihilar bronchitic changes as can be seen and reactive airways disease. 2.  No acute airspace disease.   Original Report Authenticated By: Jamesetta Orleans. MATTERN, M.D.    Admission HPI: [name] is a 41 yo lady with a history of recurrent asthma, anxiety, depression, who presents with 3 days of worsening shortness of breath, CP, cough, and wheezing. She was evaluated in the ED earlier today but returned with similar symptoms several hours later despite using inhalers. She was given a Rx for prednisone which she did not fill. She has been using her inhalers 3-4 times per hour. Cough is nonproductive, chest discomfort is in the center of her chest, worse with coughing. She states that the SOB is worse at night. She did vomit twice on Sunday, but none since then. No diarrhea, constipation, no sore throat. + sick contact, took care of her nephew with a URI.  ROS + for headache.  Former smoker.  Hospital Course by problem list:   Extrinsic asthma with exacerbation--April [name] has a history of recurrent asthma exacerbations and presented with 3 days of worsening shortness of breath, CP, cough, and wheezing.  She was previously seen in the ED and was given prescription for prednisone which she did not get filled.  She has trouble affording her medications and as such it seems she has acquired different samples from different providers over time.  No acute airspace disease seen on CXR.  Mild perihilar bronchitic changes and reactive airways disease present.  During admission, she was started on IV steroids, inhaled duo nebs, and inhaled steroids.  Morning after admission, she felt much better and requested to go home.  Respiratory status improved, no wheezing, saturating well and tolerated diet.  She was discharged home with prednisone taper which she said she can now afford and will get filled and a 10 day course of doxycycline which was arranged with case management for medication assistance.  She will also need  to follow up in [facility] on 03/02/12 and with her pcp.  She is requesting sample inhalers which we will try to arrange in opc. Discontinued Dulera and Symbicort that were on her home medication regimen at this time.  Advair and Albuterol can likely be provided in [facility].  Ms. Romagnoli is encouraged to get orange card to assist her with her healthcare and medication needs.      HYPERTENSION, ESSENTIAL NOS--monitored throughout admission.  Kept on HCTZ during admission. Discharged back on home medication.  Advised to follow up with pcp  Esophageal Reflex: stable during admission.  Takes Prilosec at home.  Kept on protonix during admission.  Will resume prilosec on discharge.    Discharge Vitals:  BP 158/88  Pulse 94  Temp 98.1 F (36.7 C) (Oral)  Resp 24  Ht 5\' 5"  (1.651 m)  Wt 332 lb 3.7 oz (150.7 kg)  BMI 55.29 kg/m2  SpO2 98%  LMP 02/07/2012  Discharge Labs: Results for MAKENLY, LARABEE (MRN [id number]) as of 02/28/2012 21:15  Ref. Range 02/26/2012 22:19 02/27/2012 00:34 02/27/2012 04:35  Sample type No range found ARTERIAL    pH, Arterial Latest Range: 7.350-7.450  7.392    pCO2 arterial Latest Range: 35.0-45.0 mmHg 33.5 (L)    pO2, Arterial Latest Range: 80.0-100.0 mmHg 83.0    Bicarbonate Latest Range: 20.0-24.0 mEq/L 20.4    TCO2 Latest Range: 0-100 mmol/L 21    Acid-base deficit Latest Range: 0.0-2.0 mmol/L 4.0 (H)    O2 Saturation No range found 96.0    Collection site No range found RADIAL, ALLEN'S TEST ACCEPTABLE    Sodium Latest Range: 135-145 mEq/L   135  Potassium Latest Range: 3.5-5.1 mEq/L   3.8  Chloride Latest Range: 96-112 mEq/L   102  CO2 Latest Range: 19-32 mEq/L   21  BUN Latest Range: 6-23 mg/dL   8  Creatinine Latest Range: 0.50-1.10 mg/dL   4.09  Calcium Latest Range: 8.4-10.5 mg/dL   9.4  GFR calc non Af Amer Latest Range: >90 mL/min   >90  GFR calc Af Amer Latest Range: >90 mL/min   >90  Glucose Latest Range: 70-99 mg/dL   811 (H)  Magnesium Latest Range: 1.5-2.5 mg/dL    2.4  Alkaline Phosphatase Latest Range: 39-117 U/L   71  Albumin Latest Range: 3.5-5.2 g/dL   3.6  AST Latest Range: 0-37 U/L   22  ALT Latest Range: 0-35 U/L   20  Total Protein Latest Range: 6.0-8.3 g/dL   8.2  Total Bilirubin Latest Range: 0.3-1.2 mg/dL   0.1 (L)  WBC Latest Range: 4.0-10.5 K/uL   12.1 (H)  RBC Latest Range: 3.87-5.11 MIL/uL   4.73  Hemoglobin Latest Range: 12.0-15.0 g/dL   91.4  HCT Latest Range: 36.0-46.0 %   40.0  MCV Latest Range: 78.0-100.0 fL   84.6  MCH Latest Range: 26.0-34.0 pg   28.3  MCHC Latest Range: 30.0-36.0 g/dL   78.2  RDW Latest Range: 11.5-15.5 %   16.1 (H)  Platelets Latest Range: 150-400 K/uL   351  Neutrophils Relative Latest Range: 43-77 %   85 (H)  Lymphocytes Relative Latest Range: 12-46 %   12  Monocytes Relative Latest Range: 3-12 %   3  Eosinophils Relative Latest Range: 0-5 %   0  Basophils Relative Latest Range: 0-1 %   0  NEUT# Latest Range: 1.7-7.7 K/uL   10.4 (H)  Lymphocytes Absolute Latest Range: 0.7-4.0 K/uL   1.5  Monocytes Absolute Latest Range: 0.1-1.0 K/uL   0.3  Eosinophils Absolute Latest Range: 0.0-0.7 K/uL   0.0  Basophils Absolute Latest Range: 0.0-0.1 K/uL   0.0  IgE (Immunoglobulin E), Serum Latest Range: 0.0-180.0 IU/mL   281.8 (H)  MRSA by PCR Latest Range: NEGATIVE   NEGATIVE    Signed: Darden Palmer 03/04/2012, 10:48 PM   Time Spent on Discharge: 45 minutes

## 2012-02-28 NOTE — ED Provider Notes (Signed)
Medical screening examination/treatment/procedure(s) were performed by non-physician practitioner and as supervising physician I was immediately available for consultation/collaboration.   Laray Anger, DO 02/28/12 2320

## 2012-03-02 ENCOUNTER — Encounter: Payer: Self-pay | Admitting: Internal Medicine

## 2012-03-02 ENCOUNTER — Ambulatory Visit: Payer: Self-pay | Admitting: Internal Medicine

## 2012-03-02 ENCOUNTER — Ambulatory Visit (INDEPENDENT_AMBULATORY_CARE_PROVIDER_SITE_OTHER): Payer: Commercial Managed Care - PPO | Admitting: Internal Medicine

## 2012-03-02 VITALS — BP 120/90 | HR 80 | Temp 98.3°F | Ht 67.0 in | Wt 334.8 lb

## 2012-03-02 DIAGNOSIS — J45909 Unspecified asthma, uncomplicated: Secondary | ICD-10-CM

## 2012-03-02 DIAGNOSIS — J45901 Unspecified asthma with (acute) exacerbation: Secondary | ICD-10-CM

## 2012-03-02 NOTE — Progress Notes (Signed)
Subjective:    Patient ID: April [name], female    DOB: [birth year], 41 y.o.   MRN: [id number]  HPI April [name] is a 41 year old woman, who has BMI >40, has asthma, hypertension, as current medical history. She is here for a hospital follow up. She was admitted for worsening of asthma on October 30-31st to [facility]. After discharge, she has been getting better. She has no wheezing, chest tightness, chest pain or palpitations. Her shortness of breath has improved considerably. She mixed up her steroid prescriptions (the one given by the ED and one given on hospital discharge). Instead of the taper, she has been doing 20mg  twice a day of steroids. However, she has improved with that as well. 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 She says she is looking for a new job.   Prior to hospitalization, she was using her inhalers three to four times a day and not getting a sound sleep. She has to wake up 1-2 times during the night to take her inhalers. She says this is better since the discharge. Also, she mentioned that she does not have money to buy her medications and this is what leads her to get to a point where she gets worse. She has a pulmonologist she sees, Dr. Maple Hudson at [facility] and she has not seen him in a couple months. She plans to see him shortly.   Review of Systems  Constitutional: Negative.   HENT: Negative.   Respiratory: Positive for cough (getting better). Negative for choking, chest tightness, shortness of breath, wheezing and stridor.   Cardiovascular: Negative for chest pain, palpitations and leg swelling.  Gastrointestinal: Negative.   Genitourinary: Negative.   Musculoskeletal: Negative.   Psychiatric/Behavioral: Negative.    Important Medical History Acanthosis nigricans, Menorrhagia, Asthma with exacerbation, Hypertension,Insomnia,   Morbid obesity, Helicobacter pylori (H. pylori) infection, Hemorrhoids, Depression, GERD (gastroesophageal reflux disease),   Objective:   Physical Exam  Constitutional: She is oriented to person, place, and time.       Obese  HENT:  Head: Normocephalic and atraumatic.  Right Ear: External ear normal.  Left Ear: External ear normal.  Eyes: EOM are normal. Pupils are equal, round, and reactive to light.  Neck: Normal range of motion. Neck supple. No thyromegaly present.  Cardiovascular: Normal rate, regular rhythm and normal heart sounds.   Pulmonary/Chest: Effort normal. No respiratory distress. She has no wheezes. She has no rales.  Lymphadenopathy:    She has no cervical adenopathy.  Neurological: She is alert and oriented to person, place, and time.  Skin: Skin is warm.  Psychiatric: She has a normal mood and affect. Her behavior is normal.      Assessment & Plan:    I find that the patient's asthma exacerbation has multiple reasons behind it. One, the recent change of job where she has started working in dust filled environment is the main factor. She wants to change this and is looking for something else. Two, she is financially unable to purchase her medications so her management is sub optimal not because of non-compliance or lack of access but inability to get medications. I have asked her to get in touch with April [name] to be able to be evaluated for [facility] card. I have also provided her with Advair and Ventolin free samples from  the clinic. Three, her morbid obesity is a part of her entire gamut of health problems. Her night time awakenings could be a part of sleep apnea, and not really asthma exacerbations. She says that she has had a sleep study earlier couple years back, and it did not find anything abnormal. The patient does have a pulmonologist, but has not seen him in along time. I have asked her to make an appointment with him in the next month before she returns to clinic.    For now, I will continue the same regimen. She has been taking prednisone 20 mg daily instead of the taper, however, she has improved, so I will just ask her to taper down to 10 mg and then stop it by 03/05/12. I have explained this verbally to her.   Follow up in a month, for seeing how she does off of steroids, and repeat labs.   Patient has had a flu shot on 01/08/12.  NEXT VISIT: Asthma - Evaluate control, ask about job change.  Repeat blood glucose (has been high on steroids) Repeat lipid profile (lst in June 2013, LDL 123) Repeat CBC to see if WBC normalized (high on steroids). Social - [employer].  Preventive evaluation - PAP smear to be enquired about.

## 2012-03-02 NOTE — Patient Instructions (Addendum)
Hi Ms Dower,   We followed up today on your hospital admission and asthma worsening. Right now, I am glad to see that you are doing much better. Please keep taking all your medications as instructed. You have mixed your steroid prescriptions given to you in the ED and upon hospital discharge, but since you are doing well on a lower dose of steroid, its okay. Please taper the 20 mg that you are taking to 10mg  and end your steroid dose on 03/05/12. When you stop the steroids, if you feel any worsening in symptoms, please call the clinic or come and see [place]. If symptoms become much worse, please go to the ER.   To help you with finances, I have given you a person called Debra Hill's contact, who will help you get the [facility] card, if possible. Also, this visit, I am giving you some samples for Advair and Ventolin inhalers. Please see [place] in 1 month, so we can follow up on your blood work, cholesterol and high blood pressure.   I also think that you might benefit from another job, as a job in [employer] might expose you to dust and make asthma worse. You are already aware about this.   Thanks

## 2012-03-02 NOTE — Progress Notes (Signed)
Subjective: Misheel Gowans is a 41 year old lady with a history of asthma at the clinic for a follow up appointment after being discharged from the hospital on 02/28/2012 for an asthma exacerbation. Ms. Alewine reports that she is doing well on her current asthma therapy. She has had no difficulty breathing since her discharge from the hospital. Prior to her admission, she experienced approximately 2-3 episodes of shortness of breath per day, each event requiring her albuterol nebulizer. She wakes up in the middle of the night and early morning on a daily basis. Her asthma symptoms interfere with her ability to perform her normal activities, and work. Ms. 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 Her cough is productive of white phlegm, but her cough has lessened in severity. She denies fevers or chills.  Objective: Vitals- wnl Gen- NAD, pleasant demeanor Cardio- RRR, no mrg, no peripheral edema, 2+ pulses throughout Pulm- mildly diminished breath sounds, no cyanosis/clubbing, good air movement Neuro- CN II-XII grossly intact, 5/5 strength throughout, sensation intact throughout  Assessment/Plan **Asthma Ms. Talerico is doing well after her discharge from the hospital. We have advised her to get in touch with Rudell Cobb to establish an [facility], so that she may be able to afford all of her medications. Her nighttime awakenings may be due to her asthma, but may also be 2/2 OSA. If her symptoms persist at her follow up, we may need to order a sleep study. We have discussed the importance of changing careers to minimize her exposures to asthma triggers. She will discontinue her predinsone taper as planned, and finish her doxycycline course.  Follow up in 1 month- will perform health maintenance care. We will also encourage her to follow up with her pulmonologist.

## 2012-03-03 ENCOUNTER — Ambulatory Visit: Payer: Self-pay

## 2012-03-05 ENCOUNTER — Telehealth: Payer: Self-pay | Admitting: *Deleted

## 2012-03-05 NOTE — Telephone Encounter (Signed)
Message copied by Hassan Buckler on Thu Mar 05, 2012 11:23 AM ------      Message from: Baltazar Apo      Created: Wed Mar 04, 2012 10:53 PM      Regarding: call about change in medications       Dear Rivka Barbara,            [facility] you are doing well.              Can you please call Ms. Llerenas and let her know that we updated her medication list since her last hospital discharge.  I discontinued the Symbicort and Dulera (both medications very expensive and do the same thing as Advair) and she should just be on the Albuterol and Advair now that Dr. Dalphine Handing provided for her in clinic as well.              Thank you,            Dr. Jannet Mantis

## 2012-03-05 NOTE — Telephone Encounter (Signed)
Pt was called.  Informed to take Albuterol and Advair per Dr Virgina Organ; pt understood and agreed.

## 2012-03-10 ENCOUNTER — Ambulatory Visit (INDEPENDENT_AMBULATORY_CARE_PROVIDER_SITE_OTHER): Payer: Commercial Managed Care - PPO

## 2012-03-10 DIAGNOSIS — J45909 Unspecified asthma, uncomplicated: Secondary | ICD-10-CM

## 2012-03-10 NOTE — ED Provider Notes (Addendum)
History     CSN: [id number]  Arrival date & time 02/26/12  0057   First MD Initiated Contact with Patient 02/26/12 0143      No chief complaint on file.   (Consider location/radiation/quality/duration/timing/severity/associated sxs/prior treatment) HPI Comments: Worsening asthma   The history is provided by the patient.    Past Medical History  Diagnosis Date  . Acanthosis nigricans   . Menorrhagia   . Asthma with exacerbation   . Hypertension, essential   . Pain, dental   . Insomnia   . Morbid obesity   . Helicobacter pylori (H. pylori) infection   . Sleep apnea   . COPD (chronic obstructive pulmonary disease)   . Asthma     skin test 02/01/09 pos hemorrhoids  . Depression   . GERD (gastroesophageal reflux disease)   . Right wrist pain   . Tobacco user   . Depression     Past Surgical History  Procedure Date  . Tubal ligation 1996    bilateral  . Breast reduction surgery 09/2010    Family History  Problem Relation Age of Onset  . Asthma Daughter   . Cancer Paternal Aunt   . Asthma Maternal Grandmother     History  Substance Use Topics  . Smoking status: Former Smoker    Types: Cigarettes    Quit date: 06/26/2007  . Smokeless tobacco: Never Used  . Alcohol Use: No    OB History    Grav Para Term Preterm Abortions TAB SAB Ect Mult Living                  Review of Systems  Constitutional: Negative for fever.  Respiratory: Positive for cough, shortness of breath and wheezing.   Gastrointestinal: Negative.   Musculoskeletal: Negative.   Neurological: Negative for dizziness and weakness.    Allergies  Ace inhibitors; Percocet; and Zolpidem tartrate  Home Medications   Current Outpatient Rx  Name  Route  Sig  Dispense  Refill  . ALBUTEROL SULFATE HFA 108 (90 BASE) MCG/ACT IN AERS   Inhalation   Inhale 1-2 puffs into the lungs every 6 (six) hours as needed for wheezing.   1 Inhaler   0   . EPINEPHRINE 0.3 MG/0.3ML IJ DEVI  Intramuscular   Inject 0.3 mg into the muscle as needed. For severe allergic reaction          . FLUTICASONE-SALMETEROL 250-50 MCG/DOSE IN AEPB   Inhalation   Inhale 1 puff into the lungs 2 (two) times daily. Rinse mouth   3 each   3   . HYDROCHLOROTHIAZIDE 50 MG PO TABS   Oral   Take 0.5 tablets (25 mg total) by mouth daily.   30 tablet   11   . MOMETASONE FURO-FORMOTEROL FUM 100-5 MCG/ACT IN AERO   Inhalation   Inhale 2 puffs into the lungs 2 (two) times daily.   1 Inhaler   0   . OMEPRAZOLE MAGNESIUM 20 MG PO TBEC   Oral   Take 20 mg by mouth 2 (two) times daily.         [name] VALSARTAN 160 MG PO TABS   Oral   Take 1 tablet (160 mg total) by mouth daily.   30 tablet   11   . ALBUTEROL SULFATE HFA 108 (90 BASE) MCG/ACT IN AERS   Inhalation   Inhale 1-2 puffs into the lungs every 6 (six) hours as needed for wheezing or shortness of breath.   1 Inhaler  0   . BUDESONIDE-FORMOTEROL FUMARATE 160-4.5 MCG/ACT IN AERO   Inhalation   Inhale 2 puffs into the lungs 2 (two) times daily.         . IBUPROFEN 200 MG PO TABS   Oral   Take 800 mg by mouth every 6 (six) hours as needed. For pain         . LORATADINE 10 MG PO TABS   Oral   Take 1 tablet (10 mg total) by mouth daily.         [name] PREDNISONE 20 MG PO TABS      Take 3 tablets 60mg  total today (10/31) and tomorrow (11/1), then 40mg  (2 tablets) daily for 3 days, then 20mg  (1 tablet) daily for 3 days, last day 03/05/12   15 tablet   0     BP 167/98  Pulse 108  Temp 99.2 F (37.3 C) (Oral)  Resp 20  SpO2 98%  LMP 02/03/2012  Physical Exam  Constitutional: She is oriented to person, place, and time. She appears well-developed and well-nourished.  HENT:  Head: Normocephalic.  Eyes: Pupils are equal, round, and reactive to light.  Neck: Normal range of motion.  Cardiovascular: Normal rate.   Pulmonary/Chest: She is in respiratory distress. She has wheezes.  Abdominal: Soft.  Musculoskeletal: She  exhibits no edema and no tenderness.  Neurological: She is alert and oriented to person, place, and time.  Skin: Skin is warm. No rash noted. No erythema.    ED Course  Procedures (including critical care time)  Labs Reviewed - No data to display No results found.   1. Asthma exacerbation   2. Headache       MDM  Received nebs ambulated without distress and maintained oxygen sats   DC home with inhaler         Arman Filter, NP 03/10/12 2344  Arman Filter, NP 03/14/12 2045

## 2012-03-11 MED ORDER — OMALIZUMAB 150 MG ~~LOC~~ SOLR
225.0000 mg | Freq: Once | SUBCUTANEOUS | Status: AC
  Administered 2012-03-11: 225 mg via SUBCUTANEOUS

## 2012-03-12 NOTE — ED Provider Notes (Signed)
Medical screening examination/treatment/procedure(s) were performed by non-physician practitioner and as supervising physician I was immediately available for consultation/collaboration.  Olivia Mackie, MD 03/12/12 [phone]

## 2012-03-15 NOTE — ED Provider Notes (Signed)
Medical screening examination/treatment/procedure(s) were performed by non-physician practitioner and as supervising physician I was immediately available for consultation/collaboration.   Laray Anger, DO 03/15/12 1606

## 2012-03-23 NOTE — ED Provider Notes (Signed)
Medical screening examination/treatment/procedure(s) were performed by non-physician practitioner and as supervising physician I was immediately available for consultation/collaboration.   Laray Anger, DO 03/23/12 [phone]

## 2012-03-31 ENCOUNTER — Telehealth: Payer: Self-pay | Admitting: Internal Medicine

## 2012-03-31 MED ORDER — ALBUTEROL SULFATE HFA 108 (90 BASE) MCG/ACT IN AERS: 1.0000 | INHALATION_SPRAY | Freq: Four times a day (QID) | RESPIRATORY_TRACT | Status: DC | PRN

## 2012-03-31 NOTE — Telephone Encounter (Signed)
Called and spoke with patient, requesting sample of Ventolin.  Pt aware sample is upfront for pick up.

## 2012-04-01 ENCOUNTER — Ambulatory Visit (INDEPENDENT_AMBULATORY_CARE_PROVIDER_SITE_OTHER): Payer: Commercial Managed Care - PPO

## 2012-04-01 DIAGNOSIS — J45909 Unspecified asthma, uncomplicated: Secondary | ICD-10-CM

## 2012-04-01 MED ORDER — OMALIZUMAB 150 MG ~~LOC~~ SOLR
225.0000 mg | Freq: Once | SUBCUTANEOUS | Status: AC
  Administered 2012-04-01: 225 mg via SUBCUTANEOUS

## 2012-04-15 ENCOUNTER — Ambulatory Visit: Payer: Self-pay

## 2012-04-27 ENCOUNTER — Ambulatory Visit (INDEPENDENT_AMBULATORY_CARE_PROVIDER_SITE_OTHER): Payer: Commercial Managed Care - PPO

## 2012-04-27 ENCOUNTER — Telehealth: Payer: Self-pay | Admitting: Internal Medicine

## 2012-04-27 DIAGNOSIS — J45909 Unspecified asthma, uncomplicated: Secondary | ICD-10-CM

## 2012-04-27 MED ORDER — ALBUTEROL SULFATE HFA 108 (90 BASE) MCG/ACT IN AERS: 1.0000 | INHALATION_SPRAY | Freq: Four times a day (QID) | RESPIRATORY_TRACT | Status: DC | PRN

## 2012-04-27 NOTE — Telephone Encounter (Signed)
Pt aware that sample will be in the allergy clinic when she gets her injection.

## 2012-04-28 MED ORDER — OMALIZUMAB 150 MG ~~LOC~~ SOLR
225.0000 mg | Freq: Once | SUBCUTANEOUS | Status: AC
  Administered 2012-04-28: 225 mg via SUBCUTANEOUS

## 2012-05-04 ENCOUNTER — Encounter (HOSPITAL_COMMUNITY): Payer: Self-pay | Admitting: *Deleted

## 2012-05-04 ENCOUNTER — Emergency Department (INDEPENDENT_AMBULATORY_CARE_PROVIDER_SITE_OTHER)
Admission: EM | Admit: 2012-05-04 | Discharge: 2012-05-04 | Disposition: A | Discharge status: Home / Self Care | Payer: Self-pay | Source: Home / Self Care | Attending: Family Medicine | Admitting: Family Medicine

## 2012-05-04 DIAGNOSIS — Z041 Encounter for examination and observation following transport accident: Secondary | ICD-10-CM

## 2012-05-04 DIAGNOSIS — M25569 Pain in unspecified knee: Secondary | ICD-10-CM

## 2012-05-04 DIAGNOSIS — M25519 Pain in unspecified shoulder: Secondary | ICD-10-CM

## 2012-05-04 MED ORDER — DICLOFENAC SODIUM 1 % TD GEL: 4.0000 g | Freq: Four times a day (QID) | TRANSDERMAL | Status: DC
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
## 2012-05-07 ENCOUNTER — Ambulatory Visit (INDEPENDENT_AMBULATORY_CARE_PROVIDER_SITE_OTHER): Payer: Self-pay | Admitting: Internal Medicine

## 2012-05-07 ENCOUNTER — Encounter: Payer: Self-pay | Admitting: Internal Medicine

## 2012-05-07 VITALS — BP 148/83 | HR 74 | Temp 98.0°F | Ht 67.0 in | Wt 341.0 lb

## 2012-05-07 DIAGNOSIS — K219 Gastro-esophageal reflux disease without esophagitis: Secondary | ICD-10-CM

## 2012-05-07 DIAGNOSIS — J45909 Unspecified asthma, uncomplicated: Secondary | ICD-10-CM

## 2012-05-07 DIAGNOSIS — Z598 Other problems related to housing and economic circumstances: Secondary | ICD-10-CM

## 2012-05-07 DIAGNOSIS — J441 Chronic obstructive pulmonary disease with (acute) exacerbation: Secondary | ICD-10-CM | POA: Insufficient documentation

## 2012-05-07 DIAGNOSIS — Z599 Problem related to housing and economic circumstances, unspecified: Secondary | ICD-10-CM

## 2012-05-07 DIAGNOSIS — M2550 Pain in unspecified joint: Secondary | ICD-10-CM

## 2012-05-07 DIAGNOSIS — J449 Chronic obstructive pulmonary disease, unspecified: Secondary | ICD-10-CM | POA: Insufficient documentation

## 2012-05-07 MED ORDER — OMEPRAZOLE MAGNESIUM 20 MG PO TBEC: 20.0000 mg | DELAYED_RELEASE_TABLET | Freq: Two times a day (BID) | ORAL | Status: DC

## 2012-05-07 MED ORDER — IBUPROFEN 600 MG PO TABS: 600.0000 mg | ORAL_TABLET | Freq: Three times a day (TID) | ORAL | Status: DC | PRN

## 2012-05-07 MED ORDER — ACETAMINOPHEN 325 MG PO TABS: 650.0000 mg | ORAL_TABLET | Freq: Two times a day (BID) | ORAL | Status: DC

## 2012-05-07 NOTE — Patient Instructions (Signed)
1. Please come back if your joint pain get worse. 2. Please take all medications as prescribed.  3. If you have worsening of your symptoms or new symptoms arise, please call the clinic ([phone]), or go to the ER immediately if symptoms are severe.
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
## 2012-05-07 NOTE — Assessment & Plan Note (Addendum)
Patient asthma is stable. There are no signs of acute exacerbation. Patient does not have cough, shortness of breath, wheezing or chest pain currently. Her lung auscultation is clear bilaterally. Patient is taking albuterol inhaler prn, and one of 3 combined inhalers (Symbicort, Advair and Dulera). She reports that she does not take them together, instead she is taking one of these combined inhalers at one time. She seems to get these medications from different clinics for free. She asked for samples of these combined inhalers. I explained to her that we do not have enough resources to provide her with these inhalers. Patient has been followed up by pulmonologist, Dr. Maple Hudson. I suggested patient to go to Dr. Roxy Cedar clinic if she develops shortness of breath or wheezing.   -Will continue current regimen and follow up.  -will give her referral to SW for orange card

## 2012-05-11 ENCOUNTER — Ambulatory Visit: Payer: Self-pay

## 2012-05-11 ENCOUNTER — Telehealth: Payer: Self-pay | Admitting: Internal Medicine

## 2012-05-11 DIAGNOSIS — I10 Essential (primary) hypertension: Secondary | ICD-10-CM

## 2012-05-11 NOTE — Telephone Encounter (Signed)
Please advise if okay to give samples. Pt has dulera and advair on her medlist. Thanks  Last OV 08/06/11 Pending 06/05/12

## 2012-05-11 NOTE — Telephone Encounter (Signed)
We can not continue to give samples; pt should have patient assistance in place to get inhalers or we can assist her with this if new forms are needed. We can also send Rx if needed.

## 2012-05-11 NOTE — Telephone Encounter (Signed)
I spoke with pt. She stated she has insurance and will not qualify for PA. She stated she will not receive her insurance card untl "next month". She stated she can't afford the RX's right now w/o insurance. She states she is completely out and ask that we please give her samples. Please advise thakns

## 2012-05-11 NOTE — Telephone Encounter (Signed)
Can we get her in to talk to [facility] or [facility] to clarify this? She would be a good candidate for the newly restarting [facility] version of [facility].

## 2012-05-12 NOTE — Telephone Encounter (Signed)
I spoke with Budd Palmer and he states the clinic is currently located in the [facility] and we can contact them and ask how to get a pt set-up there. I called the urgent care and they provided the contact number as [phone]. I called this number and I could not leave a message. I called the pt and advised and she is ok to have referral sent to try to get her set-up there. Order placed. Carron Curie, CMA

## 2012-05-13 ENCOUNTER — Ambulatory Visit (INDEPENDENT_AMBULATORY_CARE_PROVIDER_SITE_OTHER): Payer: Self-pay

## 2012-05-13 ENCOUNTER — Ambulatory Visit: Payer: Self-pay

## 2012-05-13 DIAGNOSIS — J45909 Unspecified asthma, uncomplicated: Secondary | ICD-10-CM

## 2012-05-13 MED ORDER — OMALIZUMAB 150 MG ~~LOC~~ SOLR
225.0000 mg | Freq: Once | SUBCUTANEOUS | Status: AC
  Administered 2012-05-13: 225 mg via SUBCUTANEOUS

## 2012-05-17 ENCOUNTER — Encounter (HOSPITAL_COMMUNITY): Payer: Self-pay | Admitting: Family Medicine

## 2012-05-17 ENCOUNTER — Emergency Department (HOSPITAL_COMMUNITY): Payer: Self-pay

## 2012-05-17 ENCOUNTER — Emergency Department (HOSPITAL_COMMUNITY)
Admission: EM | Admit: 2012-05-17 | Discharge: 2012-05-17 | Disposition: A | Discharge status: Home / Self Care | Payer: Self-pay | Attending: Emergency Medicine | Admitting: Emergency Medicine

## 2012-05-17 DIAGNOSIS — I10 Essential (primary) hypertension: Secondary | ICD-10-CM | POA: Insufficient documentation

## 2012-05-17 DIAGNOSIS — Z79899 Other long term (current) drug therapy: Secondary | ICD-10-CM | POA: Insufficient documentation

## 2012-05-17 DIAGNOSIS — Z8619 Personal history of other infectious and parasitic diseases: Secondary | ICD-10-CM | POA: Insufficient documentation

## 2012-05-17 DIAGNOSIS — R6883 Chills (without fever): Secondary | ICD-10-CM | POA: Insufficient documentation

## 2012-05-17 DIAGNOSIS — F3289 Other specified depressive episodes: Secondary | ICD-10-CM | POA: Insufficient documentation

## 2012-05-17 DIAGNOSIS — Z87891 Personal history of nicotine dependence: Secondary | ICD-10-CM | POA: Insufficient documentation

## 2012-05-17 DIAGNOSIS — R51 Headache: Secondary | ICD-10-CM | POA: Insufficient documentation

## 2012-05-17 DIAGNOSIS — R062 Wheezing: Secondary | ICD-10-CM | POA: Insufficient documentation

## 2012-05-17 DIAGNOSIS — R0789 Other chest pain: Secondary | ICD-10-CM | POA: Insufficient documentation

## 2012-05-17 DIAGNOSIS — J4489 Other specified chronic obstructive pulmonary disease: Secondary | ICD-10-CM | POA: Insufficient documentation

## 2012-05-17 DIAGNOSIS — R0602 Shortness of breath: Secondary | ICD-10-CM | POA: Insufficient documentation

## 2012-05-17 DIAGNOSIS — R05 Cough: Secondary | ICD-10-CM | POA: Insufficient documentation

## 2012-05-17 DIAGNOSIS — G4730 Sleep apnea, unspecified: Secondary | ICD-10-CM | POA: Insufficient documentation

## 2012-05-17 DIAGNOSIS — F329 Major depressive disorder, single episode, unspecified: Secondary | ICD-10-CM | POA: Insufficient documentation

## 2012-05-17 DIAGNOSIS — J449 Chronic obstructive pulmonary disease, unspecified: Secondary | ICD-10-CM | POA: Insufficient documentation

## 2012-05-17 DIAGNOSIS — R059 Cough, unspecified: Secondary | ICD-10-CM | POA: Insufficient documentation

## 2012-05-17 DIAGNOSIS — Z8742 Personal history of other diseases of the female genital tract: Secondary | ICD-10-CM | POA: Insufficient documentation

## 2012-05-17 DIAGNOSIS — R5381 Other malaise: Secondary | ICD-10-CM | POA: Insufficient documentation

## 2012-05-17 DIAGNOSIS — IMO0001 Reserved for inherently not codable concepts without codable children: Secondary | ICD-10-CM | POA: Insufficient documentation

## 2012-05-17 DIAGNOSIS — K219 Gastro-esophageal reflux disease without esophagitis: Secondary | ICD-10-CM | POA: Insufficient documentation

## 2012-05-17 DIAGNOSIS — J45909 Unspecified asthma, uncomplicated: Secondary | ICD-10-CM | POA: Insufficient documentation

## 2012-05-17 LAB — CBC WITH DIFFERENTIAL/PLATELET
Basophils Relative: 0 % (ref 0–1)
HCT: 40.8 % (ref 36.0–46.0)
Hemoglobin: 13.5 g/dL (ref 12.0–15.0)
Lymphocytes Relative: 17 % (ref 12–46)
Lymphs Abs: 1.7 10*3/uL (ref 0.7–4.0)
Monocytes Absolute: 0.8 10*3/uL (ref 0.1–1.0)
Monocytes Relative: 8 % (ref 3–12)
Neutro Abs: 7.3 10*3/uL (ref 1.7–7.7)
Neutrophils Relative %: 72 % (ref 43–77)
RBC: 4.7 MIL/uL (ref 3.87–5.11)
WBC: 10.1 10*3/uL (ref 4.0–10.5)

## 2012-05-17 LAB — BASIC METABOLIC PANEL
BUN: 6 mg/dL (ref 6–23)
CO2: 27 mEq/L (ref 19–32)
Chloride: 97 mEq/L (ref 96–112)
Creatinine, Ser: 0.69 mg/dL (ref 0.50–1.10)
GFR calc Af Amer: >90 mL/min (ref >90)
Potassium: 3.5 mEq/L (ref 3.5–5.1)

## 2012-05-17 MED ORDER — PREDNISONE 20 MG PO TABS: 20.0000 mg | ORAL_TABLET | Freq: Every day | ORAL | Status: DC

## 2012-05-17 MED ORDER — ALBUTEROL (5 MG/ML) CONTINUOUS INHALATION SOLN: 10.0000 mg/h | INHALATION_SOLUTION | Freq: Once | RESPIRATORY_TRACT | Status: DC

## 2012-05-17 MED ORDER — ALBUTEROL SULFATE (5 MG/ML) 0.5% IN NEBU
5.0000 mg | INHALATION_SOLUTION | Freq: Once | RESPIRATORY_TRACT | Status: AC
  Administered 2012-05-17: 5 mg via RESPIRATORY_TRACT
  Filled 2012-05-17: qty 1

## 2012-05-17 MED ORDER — PREDNISONE 20 MG PO TABS
60.0000 mg | ORAL_TABLET | Freq: Once | ORAL | Status: AC
  Administered 2012-05-17: 60 mg via ORAL
  Filled 2012-05-17: qty 3

## 2012-05-17 MED ORDER — ALBUTEROL (5 MG/ML) CONTINUOUS INHALATION SOLN
10.0000 mg/h | INHALATION_SOLUTION | Freq: Once | RESPIRATORY_TRACT | Status: AC
  Administered 2012-05-17: 10 mg/h via RESPIRATORY_TRACT
  Filled 2012-05-17: qty 20

## 2012-05-17 MED ORDER — ALBUTEROL SULFATE HFA 108 (90 BASE) MCG/ACT IN AERS
2.0000 | INHALATION_SPRAY | Freq: Once | RESPIRATORY_TRACT | Status: AC
  Administered 2012-05-17: 2 via RESPIRATORY_TRACT
  Filled 2012-05-17: qty 6.7

## 2012-05-17 MED ORDER — IPRATROPIUM BROMIDE 0.02 % IN SOLN
0.5000 mg | Freq: Once | RESPIRATORY_TRACT | Status: AC
  Administered 2012-05-17: 0.5 mg via RESPIRATORY_TRACT
  Filled 2012-05-17: qty 2.5

## 2012-05-17 NOTE — ED Notes (Signed)
Patient transported to X-ray 

## 2012-05-17 NOTE — ED Notes (Signed)
RT at bedside.

## 2012-05-17 NOTE — ED Provider Notes (Signed)
History     CSN: [id number]  Arrival date & time 05/17/12  1513   None     Chief Complaint  Patient presents with  . Asthma    (Consider location/radiation/quality/duration/timing/severity/associated sxs/prior treatment) HPI Comments: Pt presents with PMHx of hypertension, morbid obesity, COPD, and asthma. She has a chief complaint of cough that started last night and progressively worsened overnight and today. She says that the cough is very persistent and is productive of both yellow sputum and blood.  She also complains of shortness of breath, tightness in her chest and wheezing.  She feels like she may have run a fever in the night due to feeling hot and cold, but never took her temperature.  She also has body aches "all over" and a headache. She states that she is also weak and very fatigued. She has tried taking her rescue inhaler for her asthma symptoms and cough, but it has not helped.  She also took Nyquil last night to help her sleep and did receive some relief from that.  Deep inspiration makes her short of breath and induces a coughing fit.  She had a recent episode of asthma exacerbation back in Nov of 2013 and had to be hospitalized for several days with breathing treatments and prednisone.  She has never been intubated and the last hospitalization was in "step down".  She has not been on steroids since Nov.   Pt states that she is not a smoker, but does have a history of smoking and stopped in 2009. She does not know if she has a family history of lung problems. She denies any recent travel out of the country. She did receive the flu shot this year and has recently been around others that are ill with a similar upper respiratory infection.  Pt denies any chest pain, leg swelling, pleurisy,  ear pain, sore throat, sneezing, rhinorrhea, nasal congestions, urinary changes or pain, hematuria, constipation, diarrhea, nausea, or vomiting. She did, however, say that she felt like she may  vomit yesterday from a coughing fit.   The history is provided by the patient.    Past Medical History  Diagnosis Date  . Acanthosis nigricans   . Menorrhagia   . Asthma with exacerbation   . Hypertension, essential   . Pain, dental   . Insomnia   . Morbid obesity   . Helicobacter pylori (H. pylori) infection   . Sleep apnea   . COPD (chronic obstructive pulmonary disease)   . Asthma     skin test 02/01/09 pos hemorrhoids  . Depression   . GERD (gastroesophageal reflux disease)   . Right wrist pain   . Tobacco user   . Depression     Past Surgical History  Procedure Date  . Tubal ligation 1996    bilateral  . Breast reduction surgery 09/2010    Family History  Problem Relation Age of Onset  . Asthma Daughter   . Cancer Paternal Aunt   . Asthma Maternal Grandmother   . Hypertension Mother     History  Substance Use Topics  . Smoking status: Former Smoker    Types: Cigarettes    Quit date: 06/26/2007  . Smokeless tobacco: Never Used  . Alcohol Use: No    OB History    Grav Para Term Preterm Abortions TAB SAB Ect Mult Living                  Review of Systems  Constitutional: Positive for  chills and fatigue.  HENT: Negative for ear pain, congestion, sore throat and rhinorrhea.   Eyes: Negative.   Respiratory: Positive for cough, chest tightness, shortness of breath and wheezing.   Cardiovascular: Negative for palpitations and leg swelling.  Gastrointestinal: Negative.   Genitourinary: Negative.   Musculoskeletal: Positive for myalgias.  Skin: Negative.   Neurological: Positive for headaches.  Hematological: Negative.   Psychiatric/Behavioral: Negative.   All other systems reviewed and are negative.    Allergies  Ace inhibitors; Percocet; and Zolpidem tartrate  Home Medications   Current Outpatient Rx  Name  Route  Sig  Dispense  Refill  . ACETAMINOPHEN 325 MG PO TABS   Oral   Take 2 tablets (650 mg total) by mouth 2 (two) times daily.    60 tablet   3   . ALBUTEROL SULFATE HFA 108 (90 BASE) MCG/ACT IN AERS   Inhalation   Inhale 1-2 puffs into the lungs every 6 (six) hours as needed for wheezing.   1 Inhaler   0   . BUDESONIDE-FORMOTEROL FUMARATE 160-4.5 MCG/ACT IN AERO   Inhalation   Inhale 2 puffs into the lungs 2 (two) times daily.         [name] DICLOFENAC SODIUM 1 % TD GEL   Topical   Apply 4 g topically 4 (four) times daily.   3 Tube   0   . EPINEPHRINE 0.3 MG/0.3ML IJ DEVI   Intramuscular   Inject 0.3 mg into the muscle as needed. For severe allergic reaction          . FLUTICASONE-SALMETEROL 250-50 MCG/DOSE IN AEPB   Inhalation   Inhale 1 puff into the lungs 2 (two) times daily. Rinse mouth   3 each   3   . HYDROCHLOROTHIAZIDE 50 MG PO TABS   Oral   Take 0.5 tablets (25 mg total) by mouth daily.   30 tablet   11   . IBUPROFEN 600 MG PO TABS   Oral   Take 1 tablet (600 mg total) by mouth every 8 (eight) hours as needed. For pain   60 tablet   3   . LORATADINE 10 MG PO TABS   Oral   Take 1 tablet (10 mg total) by mouth daily.         . MOMETASONE FURO-FORMOTEROL FUM 100-5 MCG/ACT IN AERO   Inhalation   Inhale 2 puffs into the lungs 2 (two) times daily.   1 Inhaler   0   . OMEPRAZOLE MAGNESIUM 20 MG PO TBEC   Oral   Take 1 tablet (20 mg total) by mouth 2 (two) times daily.   60 tablet   5   . VALSARTAN 160 MG PO TABS   Oral   Take 1 tablet (160 mg total) by mouth daily.   30 tablet   11     BP 135/80  Pulse 84  Temp 99.4 F (37.4 C)  Resp 18  SpO2 97%  LMP 04/08/2012  Physical Exam  Nursing note and vitals reviewed. Constitutional: She is oriented to person, place, and time. She appears well-developed and well-nourished. No distress.  HENT:  Head: Normocephalic and atraumatic.  Right Ear: External ear normal.  Left Ear: External ear normal.  Nose: Nose normal.  Mouth/Throat: Oropharynx is clear and moist.       There was no evidence of discharge from either eye,  no rhinorrhea, and the oropharynx was normal.  Eyes: Conjunctivae normal and EOM are normal. Pupils are equal,  round, and reactive to light. Right eye exhibits no discharge. Left eye exhibits no discharge.  Neck: Normal range of motion. Neck supple.       No stridor.  Neck supple with full range of motion.  Cardiovascular: Normal rate, regular rhythm, normal heart sounds and intact distal pulses.   Pulmonary/Chest: Effort normal. No stridor. She has wheezes. She exhibits tenderness.       Pt had wheezes throughout on expiration. No acute respiratory distress, accessory muscle use or nasal flaring. Pulse ox 97% on RA  Abdominal: Soft. Bowel sounds are normal. There is no tenderness.  Musculoskeletal: Normal range of motion. She exhibits no edema and no tenderness.  Neurological: She is alert and oriented to person, place, and time.  Skin: Skin is warm and dry. No rash noted. She is not diaphoretic.  Psychiatric: She has a normal mood and affect. Her behavior is normal.    ED Course  Procedures (including critical care time)  Labs Reviewed - No data to display No results found.   No diagnosis found.    MDM  Pt to ED with likely viral etiology of URI inducing asthma exacerbating. Imaging reviewed without acute infiltrate. Patient ambulated in ED with O2 saturations maintained >90, no current signs of respiratory distress. Steroids and nebulizer given while in ED.  Lung exam improved after hospital treatment. Pt states they are breathing at baseline. Pt has been instructed to continue using prescribed medications and to speak with PCP about today's exacerbation.          Jaci Carrel, [place] 05/17/12 2038

## 2012-05-17 NOTE — ED Notes (Signed)
Per pt sts right shoulder pain and asthma asthma is flaring up. sts took inhaler at home without relief. Pt in no acute distress.

## 2012-05-18 NOTE — ED Provider Notes (Signed)
Medical screening examination/treatment/procedure(s) were performed by non-physician practitioner and as supervising physician I was immediately available for consultation/collaboration.   Carleene Cooper III, MD 05/18/12 1249

## 2012-05-20 ENCOUNTER — Emergency Department (HOSPITAL_COMMUNITY)
Admission: EM | Admit: 2012-05-20 | Discharge: 2012-05-20 | Disposition: A | Discharge status: Home / Self Care | Payer: Self-pay | Attending: Emergency Medicine | Admitting: Emergency Medicine

## 2012-05-20 ENCOUNTER — Emergency Department (HOSPITAL_COMMUNITY): Payer: Self-pay

## 2012-05-20 ENCOUNTER — Encounter (HOSPITAL_COMMUNITY): Payer: Self-pay

## 2012-05-20 DIAGNOSIS — R51 Headache: Secondary | ICD-10-CM | POA: Insufficient documentation

## 2012-05-20 DIAGNOSIS — J441 Chronic obstructive pulmonary disease with (acute) exacerbation: Secondary | ICD-10-CM | POA: Insufficient documentation

## 2012-05-20 DIAGNOSIS — Z87891 Personal history of nicotine dependence: Secondary | ICD-10-CM | POA: Insufficient documentation

## 2012-05-20 DIAGNOSIS — Z79899 Other long term (current) drug therapy: Secondary | ICD-10-CM | POA: Insufficient documentation

## 2012-05-20 DIAGNOSIS — K219 Gastro-esophageal reflux disease without esophagitis: Secondary | ICD-10-CM | POA: Insufficient documentation

## 2012-05-20 DIAGNOSIS — IMO0002 Reserved for concepts with insufficient information to code with codable children: Secondary | ICD-10-CM | POA: Insufficient documentation

## 2012-05-20 DIAGNOSIS — R05 Cough: Secondary | ICD-10-CM | POA: Insufficient documentation

## 2012-05-20 DIAGNOSIS — I10 Essential (primary) hypertension: Secondary | ICD-10-CM | POA: Insufficient documentation

## 2012-05-20 DIAGNOSIS — J45901 Unspecified asthma with (acute) exacerbation: Secondary | ICD-10-CM | POA: Insufficient documentation

## 2012-05-20 DIAGNOSIS — R059 Cough, unspecified: Secondary | ICD-10-CM | POA: Insufficient documentation

## 2012-05-20 DIAGNOSIS — Z87448 Personal history of other diseases of urinary system: Secondary | ICD-10-CM | POA: Insufficient documentation

## 2012-05-20 DIAGNOSIS — IMO0001 Reserved for inherently not codable concepts without codable children: Secondary | ICD-10-CM | POA: Insufficient documentation

## 2012-05-20 DIAGNOSIS — J45909 Unspecified asthma, uncomplicated: Secondary | ICD-10-CM | POA: Insufficient documentation

## 2012-05-20 DIAGNOSIS — R079 Chest pain, unspecified: Secondary | ICD-10-CM | POA: Insufficient documentation

## 2012-05-20 DIAGNOSIS — Z8659 Personal history of other mental and behavioral disorders: Secondary | ICD-10-CM | POA: Insufficient documentation

## 2012-05-20 DIAGNOSIS — Z8619 Personal history of other infectious and parasitic diseases: Secondary | ICD-10-CM | POA: Insufficient documentation

## 2012-05-20 LAB — POCT I-STAT, CHEM 8
BUN: 8 mg/dL (ref 6–23)
Calcium, Ion: 1.16 mmol/L (ref 1.12–1.23)
Chloride: 104 mEq/L (ref 96–112)
Glucose, Bld: 147 mg/dL — ABNORMAL HIGH (ref 70–99)
HCT: 41 % (ref 36.0–46.0)
Potassium: 3.5 mEq/L (ref 3.5–5.1)

## 2012-05-20 LAB — PRO B NATRIURETIC PEPTIDE: Pro B Natriuretic peptide (BNP): 92.4 pg/mL (ref 0–125)

## 2012-05-20 LAB — POCT I-STAT TROPONIN I

## 2012-05-20 MED ORDER — HYDROCODONE-ACETAMINOPHEN 7.5-325 MG/15ML PO SOLN
10.0000 mL | Freq: Once | ORAL | Status: AC
  Administered 2012-05-20: 10 mL via ORAL
  Filled 2012-05-20: qty 15

## 2012-05-20 MED ORDER — AZITHROMYCIN 250 MG PO TABS: ORAL_TABLET | ORAL | Status: DC

## 2012-05-20 MED ORDER — ALBUTEROL SULFATE HFA 108 (90 BASE) MCG/ACT IN AERS
2.0000 | INHALATION_SPRAY | RESPIRATORY_TRACT | Status: DC | PRN
  Administered 2012-05-20: 2 via RESPIRATORY_TRACT
  Filled 2012-05-20: qty 6.7

## 2012-05-20 MED ORDER — ALBUTEROL SULFATE (5 MG/ML) 0.5% IN NEBU
10.0000 mg | INHALATION_SOLUTION | Freq: Once | RESPIRATORY_TRACT | Status: AC
  Administered 2012-05-20: 10 mg via RESPIRATORY_TRACT
  Filled 2012-05-20: qty 80

## 2012-05-20 MED ORDER — AZITHROMYCIN 250 MG PO TABS
500.0000 mg | ORAL_TABLET | Freq: Once | ORAL | Status: AC
  Administered 2012-05-20: 500 mg via ORAL
  Filled 2012-05-20: qty 2

## 2012-05-20 MED ORDER — METHYLPREDNISOLONE SODIUM SUCC 125 MG IJ SOLR
125.0000 mg | Freq: Once | INTRAMUSCULAR | Status: AC
  Administered 2012-05-20: 125 mg via INTRAVENOUS
  Filled 2012-05-20: qty 2

## 2012-05-20 MED ORDER — AEROCHAMBER PLUS FLO-VU LARGE MISC
1.0000 | Freq: Once | Status: AC
  Administered 2012-05-20: 1
  Filled 2012-05-20: qty 1

## 2012-05-20 NOTE — ED Provider Notes (Signed)
History     CSN: [id number]  Arrival date & time 05/20/12  0401   First MD Initiated Contact with Patient 05/20/12 [phone]      Chief Complaint  Patient presents with  . Shortness of Breath    (Consider location/radiation/quality/duration/timing/severity/associated sxs/prior treatment) HPI April [name] is a 42 y.o. female with past medical history significant for tobacco abuse smoking one pack a day, COPD and recurrent cough and shortness of breath that has been present for at least the last 3 days. Patient was seen in the emergency department prescribed prednisone has been using her albuterol inhaler as well as nebulizer treatments about 4 times a day. She's been using her inhaler quite frequently however she's not been using with a spacer.  She is complaining about some sharp chest pain, that is worse on inspiration, coughing. She has a frequent cough been productive of gel a sputum that is occasionally streaked with blood. She's been constantly wheezing since being seen in the MRSA department 3 days ago also complaining about headache is worse when she coughs as well as generalized myalgias. Describes a couple instances of post tussive emesis.  She has had previous hospitalizations for asthma exacerbations. She did get a flu shot this year earlier this year. Sick contacts   Past Medical History  Diagnosis Date  . Acanthosis nigricans   . Menorrhagia   . Asthma with exacerbation   . Hypertension, essential   . Pain, dental   . Insomnia   . Morbid obesity   . Helicobacter pylori (H. pylori) infection   . Sleep apnea   . COPD (chronic obstructive pulmonary disease)   . Asthma     skin test 02/01/09 pos hemorrhoids  . Depression   . GERD (gastroesophageal reflux disease)   . Right wrist pain   . Tobacco user   . Depression     Past Surgical History  Procedure Date  . Tubal ligation 1996    bilateral  . Breast reduction surgery 09/2010    Family History  Problem Relation  Age of Onset  . Asthma Daughter   . Cancer Paternal Aunt   . Asthma Maternal Grandmother   . Hypertension Mother     History  Substance Use Topics  . Smoking status: Former Smoker    Types: Cigarettes    Quit date: 06/26/2007  . Smokeless tobacco: Never Used  . Alcohol Use: No    OB History    Grav Para Term Preterm Abortions TAB SAB Ect Mult Living                  Review of Systems At least 10pt or greater review of systems completed and are negative except where specified in the HPI.  Allergies  Ace inhibitors; Percocet; and Zolpidem tartrate  Home Medications   Current Outpatient Rx  Name  Route  Sig  Dispense  Refill  . ALBUTEROL SULFATE HFA 108 (90 BASE) MCG/ACT IN AERS   Inhalation   Inhale 1-2 puffs into the lungs every 6 (six) hours as needed for wheezing.   1 Inhaler   0   . BUDESONIDE-FORMOTEROL FUMARATE 160-4.5 MCG/ACT IN AERO   Inhalation   Inhale 2 puffs into the lungs 2 (two) times daily.         [name] DICLOFENAC SODIUM 1 % TD GEL   Topical   Apply 4 g topically 4 (four) times daily.   3 Tube   0   . EPINEPHRINE 0.3 MG/0.3ML IJ  DEVI   Intramuscular   Inject 0.3 mg into the muscle as needed. For severe allergic reaction          . FLUTICASONE-SALMETEROL 250-50 MCG/DOSE IN AEPB   Inhalation   Inhale 1 puff into the lungs 2 (two) times daily. Rinse mouth   3 each   3   . HYDROCHLOROTHIAZIDE 50 MG PO TABS   Oral   Take 0.5 tablets (25 mg total) by mouth daily.   30 tablet   11   . IBUPROFEN 600 MG PO TABS   Oral   Take 1 tablet (600 mg total) by mouth every 8 (eight) hours as needed. For pain   60 tablet   3   . LORATADINE 10 MG PO TABS   Oral   Take 1 tablet (10 mg total) by mouth daily.         . MOMETASONE FURO-FORMOTEROL FUM 100-5 MCG/ACT IN AERO   Inhalation   Inhale 2 puffs into the lungs 2 (two) times daily.   1 Inhaler   0   . OMEPRAZOLE MAGNESIUM 20 MG PO TBEC   Oral   Take 1 tablet (20 mg total) by mouth 2 (two)  times daily.   60 tablet   5   . PREDNISONE 20 MG PO TABS   Oral   Take 1 tablet (20 mg total) by mouth daily. Take 3 tabs day 1, 2 tabs day 2 & 3, then 1 tab day 4-7   11 tablet   0   . VALSARTAN 160 MG PO TABS   Oral   Take 1 tablet (160 mg total) by mouth daily.   30 tablet   11     BP 179/88  Pulse 104  Temp 97.9 F (36.6 C) (Axillary)  Resp 20  SpO2 100%  LMP 05/09/2012  Physical Exam  Nursing notes reviewed.  Electronic medical record reviewed. VITAL SIGNS:   Filed Vitals:   05/20/12 0402 05/20/12 0404 05/20/12 0456  BP:  179/88   Pulse:  104   Temp:  97.9 F (36.6 C)   TempSrc:  Axillary   Resp:  20   SpO2: 98% 100% 100%   CONSTITUTIONAL: Awake, oriented, appears non-toxic HENT: Atraumatic, normocephalic, oral mucosa pink and moist, airway patent. Nares patent without drainage. External ears normal. EYES: Conjunctiva clear, EOMI, PERRLA NECK: Trachea midline, non-tender, supple CARDIOVASCULAR: Normal heart rate, Normal rhythm, No murmurs, rubs, gallops PULMONARY/CHEST: Fair air movement, wheezing throughout inspiratory and expiratory. Mild rales in the bases. No consolidation noted ABDOMINAL: Non-distended, morbidly obese, soft, non-tender - no rebound or guarding.  BS normal. NEUROLOGIC: Non-focal, moving all four extremities, no gross sensory or motor deficits. EXTREMITIES: No clubbing, cyanosis, or edema SKIN: Warm, Dry, No erythema, No rash  ED Course  Procedures (including critical care time)  Date: 05/20/2012  Rate: 90  Rhythm: normal sinus rhythm  QRS Axis: normal  Intervals: normal  ST/T Wave abnormalities: Mild ST depression in the inferior leads  Conduction Disutrbances: QTC is 480  Narrative Interpretation: Mild depression in the inferior leads     Labs Reviewed  POCT I-STAT, CHEM 8 - Abnormal; Notable for the following:    Glucose, Bld 147 (*)     All other components within normal limits  D-DIMER, QUANTITATIVE  PRO B  NATRIURETIC PEPTIDE   Dg Chest 2 View  05/20/2012  *RADIOLOGY REPORT*  Clinical Data: Shortness of breath; respiratory distress.  CHEST - 2 VIEW  Comparison: Chest radiograph performed 05/17/2012  Findings: The lungs are well-aerated.  Mild vascular congestion is noted.  Mild left basilar opacity could reflect atelectasis or possibly mild pneumonia.  Mild peribronchial thickening is seen. There is no evidence of pleural effusion or pneumothorax.  The heart is normal in size; the mediastinal contour is within normal limits.  No acute osseous abnormalities are seen.  IMPRESSION: Mild vascular congestion noted; mild peribronchial thickening seen. Mild left basilar airspace opacity could reflect atelectasis or possibly mild pneumonia.   Original Report Authenticated By: Tonia Ghent, M.D.      1. COPD exacerbation       MDM  April [name] is a 42 y.o. female arrived via EMS for difficulty breathing-seen a couple days ago in emergency Department currently on steroids. Treat the patient's shortness of breath and wheezing aggressively and reevaluate.  After continuous nebulization, patient is feeling much better she is breathing easier, she ambulates without assistance, saturations maintained 96% on ambulation. I think the patient has likely been using 2 little albuterol, she's only been using the nebulizer 4 times daily she says she's using her inhaler quite frequently but is not using a spacer within this likely not getting the medicine into her lungs. We'll keep the patient on prednisone add azithromycin, give her another inhaler and instruct her in the use of the spacer. Also told her to go ahead and schedule nebulized treatments every 2 hours today.    Patient is mildly sleepy at discharge, she says she's gotten poor sleep over the last 2 days, she did receive some Vicodin for headache, headache pain is gone.  Labs including d-dimer are within normal limits, do not think there is any other cause of  the patient's shortness of breath -rather than the patient's reactive airway.   I explained the diagnosis and have given explicit precautions to return to the ER including any other new or worsening symptoms. The patient understands and accepts the medical plan as it's been dictated and I have answered their questions. Discharge instructions concerning home care and prescriptions have been given.  The patient is STABLE and is discharged to home in good condition.            Jones Skene, MD 05/20/12 0865

## 2012-05-20 NOTE — ED Notes (Signed)
MD notified of ambulation.  MD into speak with patient about plan of care.

## 2012-05-20 NOTE — ED Notes (Signed)
Pt. Moved to B14 due to temperature in B18 being too hot. Dr. Rulon Abide, Dr. Verl Bangs, and charge nurse notified.

## 2012-05-20 NOTE — ED Notes (Signed)
Pt. C/o difficulty breathing. Expiratory wheezes noted in all fields and inspiratory wheezing anteriorly. Pt. States she has been SOB all day and rescue inhaler has not helped, also states breathing treatments by EMS have not helped. Pt. Seen here on Monday for same and prescribed prednisone which she states she has been taking. Denies productive cough.

## 2012-05-20 NOTE — ED Notes (Signed)
Pt tolerated ambulation well.  States "I feel better, not back to 100% but I feel better."  O2 sats were 96% RA and HR 120. Breathing treatment completed.

## 2012-05-20 NOTE — ED Notes (Signed)
Per EMS, pt c/o difficulty breathing. Was here Monday for same and was prescribed prednisone. Pt. Took this AM. Pt had total of 10 albuterol and 0.5 atrovent by EMS

## 2012-05-21 ENCOUNTER — Emergency Department (HOSPITAL_COMMUNITY)
Admission: EM | Admit: 2012-05-21 | Discharge: 2012-05-21 | Disposition: A | Discharge status: Home / Self Care | Payer: Self-pay | Attending: Emergency Medicine | Admitting: Emergency Medicine

## 2012-05-21 ENCOUNTER — Encounter (HOSPITAL_COMMUNITY): Payer: Self-pay | Admitting: Emergency Medicine

## 2012-05-21 DIAGNOSIS — R110 Nausea: Secondary | ICD-10-CM | POA: Insufficient documentation

## 2012-05-21 DIAGNOSIS — J45901 Unspecified asthma with (acute) exacerbation: Secondary | ICD-10-CM | POA: Insufficient documentation

## 2012-05-21 DIAGNOSIS — Z8669 Personal history of other diseases of the nervous system and sense organs: Secondary | ICD-10-CM | POA: Insufficient documentation

## 2012-05-21 DIAGNOSIS — Z79899 Other long term (current) drug therapy: Secondary | ICD-10-CM | POA: Insufficient documentation

## 2012-05-21 DIAGNOSIS — R109 Unspecified abdominal pain: Secondary | ICD-10-CM | POA: Insufficient documentation

## 2012-05-21 DIAGNOSIS — J449 Chronic obstructive pulmonary disease, unspecified: Secondary | ICD-10-CM | POA: Insufficient documentation

## 2012-05-21 DIAGNOSIS — F3289 Other specified depressive episodes: Secondary | ICD-10-CM | POA: Insufficient documentation

## 2012-05-21 DIAGNOSIS — R0602 Shortness of breath: Secondary | ICD-10-CM

## 2012-05-21 DIAGNOSIS — J4489 Other specified chronic obstructive pulmonary disease: Secondary | ICD-10-CM | POA: Insufficient documentation

## 2012-05-21 DIAGNOSIS — R42 Dizziness and giddiness: Secondary | ICD-10-CM | POA: Insufficient documentation

## 2012-05-21 DIAGNOSIS — J45909 Unspecified asthma, uncomplicated: Secondary | ICD-10-CM

## 2012-05-21 DIAGNOSIS — K219 Gastro-esophageal reflux disease without esophagitis: Secondary | ICD-10-CM | POA: Insufficient documentation

## 2012-05-21 DIAGNOSIS — Z87891 Personal history of nicotine dependence: Secondary | ICD-10-CM | POA: Insufficient documentation

## 2012-05-21 DIAGNOSIS — R51 Headache: Secondary | ICD-10-CM | POA: Insufficient documentation

## 2012-05-21 DIAGNOSIS — Z8619 Personal history of other infectious and parasitic diseases: Secondary | ICD-10-CM | POA: Insufficient documentation

## 2012-05-21 DIAGNOSIS — I10 Essential (primary) hypertension: Secondary | ICD-10-CM | POA: Insufficient documentation

## 2012-05-21 DIAGNOSIS — R0789 Other chest pain: Secondary | ICD-10-CM | POA: Insufficient documentation

## 2012-05-21 DIAGNOSIS — F329 Major depressive disorder, single episode, unspecified: Secondary | ICD-10-CM | POA: Insufficient documentation

## 2012-05-21 DIAGNOSIS — Z8742 Personal history of other diseases of the female genital tract: Secondary | ICD-10-CM | POA: Insufficient documentation

## 2012-05-21 MED ORDER — ALBUTEROL SULFATE (5 MG/ML) 0.5% IN NEBU
5.0000 mg | INHALATION_SOLUTION | Freq: Once | RESPIRATORY_TRACT | Status: AC
  Administered 2012-05-21: 5 mg via RESPIRATORY_TRACT
  Filled 2012-05-21: qty 40

## 2012-05-21 MED ORDER — FLUTICASONE-SALMETEROL 250-50 MCG/DOSE IN AEPB: 1.0000 | INHALATION_SPRAY | Freq: Every day | RESPIRATORY_TRACT | Status: DC

## 2012-05-21 MED ORDER — AZITHROMYCIN 250 MG PO TABS
250.0000 mg | ORAL_TABLET | Freq: Once | ORAL | Status: AC
  Administered 2012-05-21: 250 mg via ORAL
  Filled 2012-05-21: qty 1

## 2012-05-21 MED ORDER — HYDROCODONE-ACETAMINOPHEN 5-325 MG PO TABS
2.0000 | ORAL_TABLET | Freq: Once | ORAL | Status: AC
  Administered 2012-05-21: 2 via ORAL
  Filled 2012-05-21: qty 2

## 2012-05-21 MED ORDER — PREDNISONE 20 MG PO TABS
40.0000 mg | ORAL_TABLET | Freq: Once | ORAL | Status: AC
  Administered 2012-05-21: 40 mg via ORAL
  Filled 2012-05-21: qty 2

## 2012-05-21 MED ORDER — IPRATROPIUM BROMIDE 0.02 % IN SOLN
0.5000 mg | Freq: Once | RESPIRATORY_TRACT | Status: AC
  Administered 2012-05-21: 0.5 mg via RESPIRATORY_TRACT
  Filled 2012-05-21: qty 2.5

## 2012-05-21 MED ORDER — ALBUTEROL (5 MG/ML) CONTINUOUS INHALATION SOLN
10.0000 mg/h | INHALATION_SOLUTION | RESPIRATORY_TRACT | Status: AC
  Administered 2012-05-21: 10 mg/h via RESPIRATORY_TRACT
  Filled 2012-05-21: qty 20

## 2012-05-21 MED ORDER — ALBUTEROL SULFATE (2.5 MG/3ML) 0.083% IN NEBU: 2.5000 mg | INHALATION_SOLUTION | Freq: Four times a day (QID) | RESPIRATORY_TRACT | Status: DC | PRN

## 2012-05-21 NOTE — ED Provider Notes (Signed)
History     CSN: [id number]  Arrival date & time 05/21/12  4098   First MD Initiated Contact with Patient 05/21/12 [phone]      Chief Complaint  Patient presents with  . Shortness of Breath    (Consider location/radiation/quality/duration/timing/severity/associated sxs/prior treatment) HPI Comments: Patient presents to ED with SOB x 3 days. States that it became hard for her to breathe around 2pm yesterday (1/22). She states the SOB is sometimes made better when she leans propped over the sink. She has an albuterol and symbicort which she states she is compliant in taking, but are providing no relief of symptoms. Patient admits to associated headache, localized chest pain which she describes as "aching", and lightheadedness. She denies vision changes, syncope, V/D, abdominal pain, and lower extremity swelling.  The history is provided by the patient. No language interpreter was used.    Past Medical History  Diagnosis Date  . Acanthosis nigricans   . Menorrhagia   . Asthma with exacerbation   . Hypertension, essential   . Pain, dental   . Insomnia   . Morbid obesity   . Helicobacter pylori (H. pylori) infection   . Sleep apnea   . COPD (chronic obstructive pulmonary disease)   . Asthma     skin test 02/01/09 pos hemorrhoids  . Depression   . GERD (gastroesophageal reflux disease)   . Right wrist pain   . Tobacco user   . Depression     Past Surgical History  Procedure Date  . Tubal ligation 1996    bilateral  . Breast reduction surgery 09/2010    Family History  Problem Relation Age of Onset  . Asthma Daughter   . Cancer Paternal Aunt   . Asthma Maternal Grandmother   . Hypertension Mother     History  Substance Use Topics  . Smoking status: Former Smoker    Types: Cigarettes    Quit date: 06/26/2007  . Smokeless tobacco: Never Used  . Alcohol Use: No    OB History    Grav Para Term Preterm Abortions TAB SAB Ect Mult Living                  Review of  Systems  Constitutional: Negative for fever.  Eyes: Negative for visual disturbance.  Respiratory: Positive for shortness of breath and wheezing.   Cardiovascular: Negative for leg swelling.       Patient describes localized central aching chest pain  Gastrointestinal: Positive for nausea and abdominal pain. Negative for vomiting and diarrhea.  Genitourinary: Negative.   Neurological: Positive for light-headedness and headaches. Negative for dizziness and syncope.    Allergies  Ace inhibitors; Percocet; and Zolpidem tartrate  Home Medications   Current Outpatient Rx  Name  Route  Sig  Dispense  Refill  . ALBUTEROL SULFATE HFA 108 (90 BASE) MCG/ACT IN AERS   Inhalation   Inhale 1-2 puffs into the lungs every 6 (six) hours as needed for wheezing.   1 Inhaler   0   . ALBUTEROL SULFATE (2.5 MG/3ML) 0.083% IN NEBU   Nebulization   Take 2.5 mg by nebulization every 6 (six) hours as needed. For wheeze or shortness of breath         . BUDESONIDE-FORMOTEROL FUMARATE 160-4.5 MCG/ACT IN AERO   Inhalation   Inhale 2 puffs into the lungs 2 (two) times daily.         [name] DICLOFENAC SODIUM 1 % TD GEL   Topical   Apply  4 g topically 4 (four) times daily.   3 Tube   0   . EPINEPHRINE 0.3 MG/0.3ML IJ DEVI   Intramuscular   Inject 0.3 mg into the muscle as needed. For severe allergic reaction          . HYDROCHLOROTHIAZIDE 50 MG PO TABS   Oral   Take 0.5 tablets (25 mg total) by mouth daily.   30 tablet   11   . IBUPROFEN 600 MG PO TABS   Oral   Take 1 tablet (600 mg total) by mouth every 8 (eight) hours as needed. For pain   60 tablet   3   . LORATADINE 10 MG PO TABS   Oral   Take 1 tablet (10 mg total) by mouth daily.         [name] OMEPRAZOLE MAGNESIUM 20 MG PO TBEC   Oral   Take 1 tablet (20 mg total) by mouth 2 (two) times daily.   60 tablet   5   . VALSARTAN 160 MG PO TABS   Oral   Take 1 tablet (160 mg total) by mouth daily.   30 tablet   11   . ALBUTEROL  SULFATE (2.5 MG/3ML) 0.083% IN NEBU   Nebulization   Take 3 mLs (2.5 mg total) by nebulization every 6 (six) hours as needed for wheezing.   75 mL   12   . FLUTICASONE-SALMETEROL 250-50 MCG/DOSE IN AEPB   Inhalation   Inhale 1 puff into the lungs daily.   60 each   1   . PREDNISONE 20 MG PO TABS   Oral   Take 1 tablet (20 mg total) by mouth daily. Take 3 tabs day 1, 2 tabs day 2 & 3, then 1 tab day 4-7   11 tablet   0     BP 175/66  Pulse 72  Temp 98.3 F (36.8 C) (Oral)  Resp 21  SpO2 100%  LMP 05/09/2012  Physical Exam  Constitutional:       Morbidly obese; in no acute distress although visibly short of breath  HENT:  Head: Normocephalic and atraumatic.  Eyes: No scleral icterus.  Neck: Neck supple.  Cardiovascular: Normal rate, regular rhythm and normal heart sounds.   Pulmonary/Chest: No respiratory distress. She has wheezes.       Diffuse expiratory wheezing in all lung fields  Abdominal: Soft. There is no tenderness. There is no guarding.  Musculoskeletal: She exhibits no edema.  Lymphadenopathy:    She has no cervical adenopathy.  Neurological: She is alert.  Skin: Skin is warm and dry.  Psychiatric: She has a normal mood and affect. Her behavior is normal.    ED Course  Procedures (including critical care time)  Labs Reviewed - No data to display Dg Chest 2 View  05/20/2012  *RADIOLOGY REPORT*  Clinical Data: Shortness of breath; respiratory distress.  CHEST - 2 VIEW  Comparison: Chest radiograph performed 05/17/2012  Findings: The lungs are well-aerated.  Mild vascular congestion is noted.  Mild left basilar opacity could reflect atelectasis or possibly mild pneumonia.  Mild peribronchial thickening is seen. There is no evidence of pleural effusion or pneumothorax.  The heart is normal in size; the mediastinal contour is within normal limits.  No acute osseous abnormalities are seen.  IMPRESSION: Mild vascular congestion noted; mild peribronchial  thickening seen. Mild left basilar airspace opacity could reflect atelectasis or possibly mild pneumonia.   Original Report Authenticated By: Tonia Ghent, M.D.  Date: 05/21/2012  Rate: 96  Rhythm: normal sinus rhythm  QRS Axis: normal  Intervals: QTc  ST/T Wave abnormalities: normal  Conduction Disutrbances:none  Narrative Interpretation:   Old EKG Reviewed: unchanged    1. Reactive airway disease   2. Shortness of breath       MDM  Patient presents to ED with 3 days of SOB. She was worked up yesterday for similar symptoms including CXR, Chem-8, D dimer, BNP; no acute disease process found. Patient was diagnosed with COPD exacerbation, given a dose of prednisone, and an antibiotic to cover for infection.  Will treat the patient symptomatically with continuous neb and give another dose of prednisone. Will monitor to see whether symptoms improve.  Patient does not feel like she has improved since tx with continuous neb and prednisone. Will get O2 sat and exertional sat for comparison. If patient decompensates will consider MgSO4 as patient is not responding as she hoped to steroids and albuterol neb tx.  Patient was able to ambulate without difficulty and pulse ox remained steady at 95% on room air. Patient is conversing and states that she feels comfortable to go home. Will instruct patient to return if symptoms worsen. Will give script for albuterol neb solution for home neb treatments as well as a script for Advair. Pt expressed a wish to try this in place of the symbicort as she states that she doesn't believe it has been helping her. Also advised to fill script for azithromycin that she received during her prior ED visit.      Antony Madura, PA-C 05/21/12 [phone]

## 2012-05-21 NOTE — ED Notes (Addendum)
Ambulated pt without difficulty with pulse ox Pt stayed steady at 95% on room air

## 2012-05-21 NOTE — ED Provider Notes (Signed)
42 year old morbidly obese female presents with a complaint of ongoing wheezing and coughing. This has been present for several days, she has had this in the past and uses frequent bronchodilator therapy at home. Over the course of the day she feels like she is not getting any better with her own nebulizer treatments nor her inhaler treatments. She was seen 24 hours ago and treated with Zithromax, prednisone and bronchodilator therapy.  On exam she has diffuse expiratory wheezing, mild increased work of breathing with tachypnea, soft abdomen, clear heart sounds and strong pulses at the radial arteries bilaterally. She is hypertensive and does not have any significant hypoxia at rest after a continuous nebulizer therapy.  I have reviewed her labs and chest x-ray from her prior visit, there is not appear to be any signs of pneumonia. She is not febrile today and doubt that this is bacterial in origin. She has been given ongoing nebulizer therapy, prednisone, will ambulate and give magnesium if desaturates or decompensates.   Medical screening examination/treatment/procedure(s) were conducted as a shared visit with non-physician practitioner(s) and myself.  I personally evaluated the patient during the encounter    Vida Roller, MD 05/21/12 [phone]

## 2012-05-21 NOTE — ED Notes (Addendum)
Patient with increased shortness of breath for last three days.  Patient states she has asthma, patient was here last night for same.  Patient also having chest pain and headache with the shortness of breath.

## 2012-05-22 ENCOUNTER — Emergency Department (HOSPITAL_COMMUNITY): Payer: Self-pay

## 2012-05-22 ENCOUNTER — Telehealth: Payer: Self-pay | Admitting: *Deleted

## 2012-05-22 ENCOUNTER — Encounter (HOSPITAL_COMMUNITY): Payer: Self-pay | Admitting: Internal Medicine

## 2012-05-22 ENCOUNTER — Observation Stay (HOSPITAL_COMMUNITY)
Admission: EM | Admit: 2012-05-22 | Discharge: 2012-05-23 | Disposition: A | Discharge status: Home / Self Care | Payer: Self-pay | Attending: Internal Medicine | Admitting: Internal Medicine

## 2012-05-22 DIAGNOSIS — R509 Fever, unspecified: Secondary | ICD-10-CM | POA: Insufficient documentation

## 2012-05-22 DIAGNOSIS — J45901 Unspecified asthma with (acute) exacerbation: Principal | ICD-10-CM | POA: Diagnosis present

## 2012-05-22 DIAGNOSIS — G4730 Sleep apnea, unspecified: Secondary | ICD-10-CM | POA: Insufficient documentation

## 2012-05-22 DIAGNOSIS — J069 Acute upper respiratory infection, unspecified: Secondary | ICD-10-CM | POA: Insufficient documentation

## 2012-05-22 DIAGNOSIS — Z87891 Personal history of nicotine dependence: Secondary | ICD-10-CM | POA: Insufficient documentation

## 2012-05-22 DIAGNOSIS — E876 Hypokalemia: Secondary | ICD-10-CM | POA: Insufficient documentation

## 2012-05-22 DIAGNOSIS — K219 Gastro-esophageal reflux disease without esophagitis: Secondary | ICD-10-CM

## 2012-05-22 DIAGNOSIS — Z6841 Body Mass Index (BMI) 40.0 and over, adult: Secondary | ICD-10-CM | POA: Insufficient documentation

## 2012-05-22 DIAGNOSIS — I10 Essential (primary) hypertension: Secondary | ICD-10-CM | POA: Insufficient documentation

## 2012-05-22 DIAGNOSIS — R51 Headache: Secondary | ICD-10-CM | POA: Insufficient documentation

## 2012-05-22 LAB — BASIC METABOLIC PANEL
BUN: 10 mg/dL (ref 6–23)
Calcium: 8.4 mg/dL (ref 8.4–10.5)
Chloride: 102 mEq/L (ref 96–112)
Creatinine, Ser: 0.7 mg/dL (ref 0.50–1.10)
GFR calc Af Amer: >90 mL/min (ref >90)
GFR calc non Af Amer: >90 mL/min (ref >90)

## 2012-05-22 LAB — MAGNESIUM: Magnesium: 2 mg/dL (ref 1.5–2.5)

## 2012-05-22 LAB — HEPATIC FUNCTION PANEL
Bilirubin, Direct: <0.1 mg/dL (ref 0.0–0.3)
Total Protein: 6.7 g/dL (ref 6.0–8.3)

## 2012-05-22 LAB — CBC WITH DIFFERENTIAL/PLATELET
Basophils Absolute: 0 10*3/uL (ref 0.0–0.1)
Basophils Relative: 0 % (ref 0–1)
Eosinophils Absolute: 0 10*3/uL (ref 0.0–0.7)
Eosinophils Relative: 0 % (ref 0–5)
HCT: 37.2 % (ref 36.0–46.0)
MCH: 27.8 pg (ref 26.0–34.0)
MCHC: 32.3 g/dL (ref 30.0–36.0)
Monocytes Absolute: 1.7 10*3/uL — ABNORMAL HIGH (ref 0.1–1.0)
Neutro Abs: 11.4 10*3/uL — ABNORMAL HIGH (ref 1.7–7.7)
RDW: 15.7 % — ABNORMAL HIGH (ref 11.5–15.5)

## 2012-05-22 MED ORDER — HYDROCODONE-ACETAMINOPHEN 5-325 MG PO TABS
1.0000 | ORAL_TABLET | ORAL | Status: DC | PRN
  Filled 2012-05-22: qty 1

## 2012-05-22 MED ORDER — ONDANSETRON HCL 4 MG/2ML IJ SOLN: 4.0000 mg | Freq: Four times a day (QID) | INTRAMUSCULAR | Status: DC | PRN

## 2012-05-22 MED ORDER — ENOXAPARIN SODIUM 40 MG/0.4ML ~~LOC~~ SOLN
40.0000 mg | SUBCUTANEOUS | Status: DC
  Filled 2012-05-22: qty 0.4

## 2012-05-22 MED ORDER — MAGNESIUM SULFATE IN D5W 10-5 MG/ML-% IV SOLN
1.0000 g | INTRAVENOUS | Status: AC
  Administered 2012-05-22: 1 g via INTRAVENOUS
  Filled 2012-05-22: qty 100

## 2012-05-22 MED ORDER — GUAIFENESIN 100 MG/5ML PO SYRP
200.0000 mg | ORAL_SOLUTION | ORAL | Status: DC | PRN
  Filled 2012-05-22: qty 118

## 2012-05-22 MED ORDER — METHYLPREDNISOLONE SODIUM SUCC 125 MG IJ SOLR
125.0000 mg | Freq: Once | INTRAMUSCULAR | Status: AC
  Administered 2012-05-22: 125 mg via INTRAVENOUS
  Filled 2012-05-22: qty 2

## 2012-05-22 MED ORDER — SODIUM CHLORIDE 0.9 % IJ SOLN: 3.0000 mL | INTRAMUSCULAR | Status: DC | PRN

## 2012-05-22 MED ORDER — ALBUTEROL SULFATE (5 MG/ML) 0.5% IN NEBU
2.5000 mg | INHALATION_SOLUTION | Freq: Once | RESPIRATORY_TRACT | Status: AC
  Administered 2012-05-22: 2.5 mg via RESPIRATORY_TRACT
  Filled 2012-05-22: qty 0.5

## 2012-05-22 MED ORDER — PREDNISONE 50 MG PO TABS
60.0000 mg | ORAL_TABLET | Freq: Every day | ORAL | Status: DC
  Administered 2012-05-23: 60 mg via ORAL
  Filled 2012-05-22 (×3): qty 1

## 2012-05-22 MED ORDER — OMEPRAZOLE MAGNESIUM 20 MG PO TBEC: 20.0000 mg | DELAYED_RELEASE_TABLET | Freq: Two times a day (BID) | ORAL | Status: DC

## 2012-05-22 MED ORDER — SODIUM CHLORIDE 0.9 % IV SOLN: 250.0000 mL | INTRAVENOUS | Status: DC | PRN

## 2012-05-22 MED ORDER — ALBUTEROL (5 MG/ML) CONTINUOUS INHALATION SOLN
10.0000 mg/h | INHALATION_SOLUTION | RESPIRATORY_TRACT | Status: DC
  Administered 2012-05-22: 10 mg/h via RESPIRATORY_TRACT
  Filled 2012-05-22: qty 20

## 2012-05-22 MED ORDER — GUAIFENESIN 100 MG/5ML PO SOLN
200.0000 mg | ORAL | Status: DC | PRN
  Filled 2012-05-22: qty 10

## 2012-05-22 MED ORDER — ALBUTEROL SULFATE 1.25 MG/3ML IN NEBU: 1.2500 mg | INHALATION_SOLUTION | RESPIRATORY_TRACT | Status: DC

## 2012-05-22 MED ORDER — PANTOPRAZOLE SODIUM 40 MG PO TBEC
40.0000 mg | DELAYED_RELEASE_TABLET | Freq: Two times a day (BID) | ORAL | Status: DC
  Administered 2012-05-23: 40 mg via ORAL
  Filled 2012-05-22: qty 1

## 2012-05-22 MED ORDER — IPRATROPIUM BROMIDE 0.02 % IN SOLN
0.5000 mg | Freq: Once | RESPIRATORY_TRACT | Status: AC
  Administered 2012-05-22: 0.5 mg via RESPIRATORY_TRACT
  Filled 2012-05-22: qty 2.5

## 2012-05-22 MED ORDER — SODIUM CHLORIDE 0.9 % IV SOLN
INTRAVENOUS | Status: AC
  Administered 2012-05-22: 23:00:00 via INTRAVENOUS

## 2012-05-22 MED ORDER — IPRATROPIUM BROMIDE 0.02 % IN SOLN
0.5000 mg | RESPIRATORY_TRACT | Status: AC
  Filled 2012-05-22: qty 2.5

## 2012-05-22 MED ORDER — ALBUTEROL SULFATE (5 MG/ML) 0.5% IN NEBU
2.5000 mg | INHALATION_SOLUTION | RESPIRATORY_TRACT | Status: DC
  Administered 2012-05-23 (×2): 2.5 mg via RESPIRATORY_TRACT
  Filled 2012-05-22 (×2): qty 0.5

## 2012-05-22 MED ORDER — ENOXAPARIN SODIUM 40 MG/0.4ML ~~LOC~~ SOLN
40.0000 mg | Freq: Every day | SUBCUTANEOUS | Status: DC
  Administered 2012-05-23: 40 mg via SUBCUTANEOUS
  Filled 2012-05-22: qty 0.4

## 2012-05-22 MED ORDER — IPRATROPIUM BROMIDE 0.02 % IN SOLN
0.5000 mg | RESPIRATORY_TRACT | Status: DC
  Administered 2012-05-22 – 2012-05-23 (×3): 0.5 mg via RESPIRATORY_TRACT
  Filled 2012-05-22 (×2): qty 2.5

## 2012-05-22 MED ORDER — HYDROCODONE-ACETAMINOPHEN 5-325 MG PO TABS
1.0000 | ORAL_TABLET | ORAL | Status: DC | PRN
  Administered 2012-05-22: 1 via ORAL

## 2012-05-22 MED ORDER — GUAIFENESIN-DM 100-10 MG/5ML PO SYRP: 5.0000 mL | ORAL_SOLUTION | ORAL | Status: DC | PRN

## 2012-05-22 MED ORDER — SODIUM CHLORIDE 0.9 % IV SOLN
Freq: Once | INTRAVENOUS | Status: AC
  Administered 2012-05-22: 20:00:00 via INTRAVENOUS

## 2012-05-22 MED ORDER — IRBESARTAN 75 MG PO TABS
75.0000 mg | ORAL_TABLET | Freq: Every day | ORAL | Status: DC
  Administered 2012-05-22 – 2012-05-23 (×2): 75 mg via ORAL
  Filled 2012-05-22 (×2): qty 1

## 2012-05-22 MED ORDER — ALBUTEROL SULFATE (5 MG/ML) 0.5% IN NEBU
1.2500 mg | INHALATION_SOLUTION | RESPIRATORY_TRACT | Status: DC
  Administered 2012-05-22: 1.25 mg via RESPIRATORY_TRACT
  Filled 2012-05-22: qty 0.5

## 2012-05-22 MED ORDER — SODIUM CHLORIDE 0.9 % IJ SOLN
3.0000 mL | Freq: Two times a day (BID) | INTRAMUSCULAR | Status: DC
  Administered 2012-05-22 – 2012-05-23 (×2): 3 mL via INTRAVENOUS

## 2012-05-22 MED ORDER — ONDANSETRON HCL 4 MG PO TABS: 4.0000 mg | ORAL_TABLET | Freq: Four times a day (QID) | ORAL | Status: DC | PRN

## 2012-05-22 MED ORDER — LORATADINE 10 MG PO TABS
10.0000 mg | ORAL_TABLET | Freq: Every day | ORAL | Status: DC
Start: 2012-05-22 — End: 2012-05-23
  Administered 2012-05-22 – 2012-05-23 (×2): 10 mg via ORAL
  Filled 2012-05-22 (×2): qty 1

## 2012-05-22 MED ORDER — ACETAMINOPHEN 325 MG PO TABS: 650.0000 mg | ORAL_TABLET | Freq: Four times a day (QID) | ORAL | Status: DC | PRN

## 2012-05-22 MED ORDER — GUAIFENESIN-DM 100-10 MG/5ML PO SYRP
5.0000 mL | ORAL_SOLUTION | ORAL | Status: DC | PRN
  Filled 2012-05-22: qty 10

## 2012-05-22 MED ORDER — MOMETASONE FURO-FORMOTEROL FUM 200-5 MCG/ACT IN AERO
2.0000 | INHALATION_SPRAY | Freq: Two times a day (BID) | RESPIRATORY_TRACT | Status: DC
  Filled 2012-05-22 (×2): qty 8.8

## 2012-05-22 MED ORDER — HYDROCHLOROTHIAZIDE 25 MG PO TABS
25.0000 mg | ORAL_TABLET | Freq: Every day | ORAL | Status: DC
  Administered 2012-05-22 – 2012-05-23 (×2): 25 mg via ORAL
  Filled 2012-05-22 (×2): qty 1

## 2012-05-22 NOTE — ED Notes (Signed)
Xray called to let them know that pt has finished cont neb tx.

## 2012-05-22 NOTE — ED Provider Notes (Signed)
This was a shared encounter.  On my exam the patient is dyspneic, with very coarse breath sounds.  Given that she has been evaluated here several times in the past week for this episode of dyspnea, and has failed outpatient treatment, she was admitted for further evaluation and management.  Gerhard Munch, MD 05/22/12 2356

## 2012-05-22 NOTE — Telephone Encounter (Signed)
I agree, she needs to be evaluated in the ED for proper treatment

## 2012-05-22 NOTE — H&P (Signed)
Hospital Admission Note Date: 05/22/2012  Patient name: April [name] Medical record number: [id number] Date of birth: January 05, 1971 Age: 42 y.o. Gender: female PCP: Carrolyn Meiers, MD   Service:  Internal Medicine Teaching Service   Attending Physician:  Dr. Kem Kays    Chief Complaint:  Dyspnea     History of Present Illness:  42 year old obese woman with asthma, presenting to the emergency department with 6 days of dyspnea. Onset was 5 days ago. At the same time, she had onset of headache, sinonasal congestion, excessive lacrimation, muscle aches, mild sore throat, and nausea. Her nephew has recently been sick with a cold and is just now getting better.  She was seen in the emergency department on the 19th (day of onset), was diagnosed with a viral URI and asthma exacerbation, and was discharged home with a prescription for a 7 day prednisone taper. She was seen in the ED 3 days later on the 22nd; this time she was diagnosed with a COPD exacerbation, and she was discharged home with a four-day prescription of azithromycin. A day later, she was seen again in the emergency department; this time, she was diagnosed with reactive airway disease and shortness of breath, and she was discharged home without any further prescriptions, but she was instructed to finish her course of azithromycin and prednisone.  She didn't fill her prednisone prescription, took a dose early this morning, and has one dose remaining. She never filled her prescription for azithromycin.     Review of Systems:   Constitutional: Positive for fever (Subjective) and chills.  HENT: Positive for congestion and sore throat. Negative for ear pain.   Eyes: Positive for discharge (excessive lacrimation).  Respiratory: Positive for cough, shortness of breath and wheezing. Negative for sputum production.   Cardiovascular: Negative.  Negative for chest pain.  Gastrointestinal: Positive for nausea. Negative for vomiting, abdominal pain,  diarrhea and constipation.  Genitourinary: Negative.  Negative for dysuria.  Musculoskeletal: Positive for myalgias (whole body).  Skin: Negative.  Negative for rash.  Neurological: Positive for headaches. Negative for dizziness.     Medical History: Past Medical History  Diagnosis Date  . Acanthosis nigricans   . Menorrhagia   . Asthma with exacerbation   . Hypertension, essential   . Pain, dental   . Insomnia   . Morbid obesity   . Helicobacter pylori (H. pylori) infection   . Sleep apnea   . COPD (chronic obstructive pulmonary disease)   . Asthma     Followed by Dr. Maple Hudson (pulmonology); receives every other week omalizumab injections  . Depression   . GERD (gastroesophageal reflux disease)   . Right wrist pain   . Tobacco user   . Depression     Surgical History: Past Surgical History  Procedure Date  . Tubal ligation 1996    bilateral  . Breast reduction surgery 09/2010    Home Medications: 1. Albuterol MDI 2 puffs every 4 hours as needed for wheezing and dyspnea 2. Nebulized albuterol 2.5 mg every 4 hours as needed for wheezing and dyspnea 3. Budesonide-formoterol 160-4.5 mcg 2 puffs twice a day 4. Diclofenac topical 4 g 4 times a day 5. EpiPen 6. Hydrochlorothiazide 25 mg daily 7. Ibuprofen 600 mg every 8 hours as needed for pain 8. Loratadine 10 mg daily 9. Omalizumab injections 225 mg every other week 10. Omeprazole 20 mg twice a day 11. Prednisone taper, currently on 20 mg a day to finish tomorrow 12. Valsartan 160 mg daily   Allergies: Allergies  as of 05/22/2012 - Review Complete 05/22/2012  Allergen Reaction Noted  . Ace inhibitors  11/24/2009  . Percocet (oxycodone-acetaminophen) Itching 01/13/2012  . Zolpidem tartrate  05/28/2011    Family History: Family History  Problem Relation Age of Onset  . Asthma Daughter   . Cancer Paternal Aunt   . Asthma Maternal Grandmother   . Hypertension Mother     Social History: Social History  .  Marital Status: Divorced    Spouse Name: N/A    Number of Children: 1  . 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 Smoking status: Former Smoker    Types: Cigarettes    Quit date: 06/26/2007  . Smokeless tobacco: Never Used  . Alcohol Use: No  . Drug Use: No  . Sexually Active: Not on file   Social History Narrative   Daughter in high school, not married, drives forklift at [employer], dust exposure on job, has tried mask but can not tolerate.     Physical exam: Filed Vitals:   05/22/12 1720  BP: 127/85  Pulse: 100  Temp: 98.4 F (36.9 C)  TempSrc: Oral  Resp: 17  SpO2: 99% on RA    GENERAL: Obese; no acute distress HEAD: atraumatic, normocephalic EYES: pupils equal, round and reactive; sclera anicteric; normal conjunctiva EARS: canals patent and TMs normal bilaterally NOSE/THROAT: oropharynx clear, moist mucous membranes, pink gums, normal dentition NECK: supple, no carotid bruits, thyroid normal in size and without palpable nodules LYMPH: no cervical or supraclavicular lymphadenopathy LUNGS: Expiratory wheezing in all lung fields, no rales, no dullness with percussion HEART: normal rate and regular rhythm; normal S1 and S2 without S3 or S4; no murmurs, rubs, or clicks PULSES: radial and dorsalis pedis pulses are 2+ and symmetric ABDOMEN: soft, non-tender, normal bowel sounds, no masses MOTOR: 5 out of 5 dorsiflexion and plantarflexion SENSATION: Intact SKIN: warm, dry, intact, normal turgor, no rashes EXTREMITIES: no peripheral edema, clubbing, or cyanosis PSYCH: patient is alert and oriented, mood and affect are normal and congruent, thought content is normal without delusions, thought process is linear, speech is normal and non-pressured, behavior is normal, judgement and insight are normal     Lab results: Basic Metabolic Panel:  Basename 05/22/12 1758 05/20/12 0535  NA 138 140  K 3.4* 3.5    CL 102 104  CO2 25 --  GLUCOSE 95 147*  BUN 10 8  CREATININE 0.70 0.90  CALCIUM 8.4 --  MG -- --  PHOS -- --    CBC: Basename 05/22/12 1758  WBC 18.1*  HGB 12.0  HCT 37.2  MCV 86.1  PLT 365  Neutrophils, %  63  Lymphocytes, %  27  Monocytes, %  9  Eosinophils, %  0  Basophil, %  0  Neutrophils  11.4  Lymphocytes  4.9  Monocytes  1.7  Eosinophils  0.0  Basophils  0.0    Imaging results: Dg Chest 2 View 05/22/2012  FINDINGS: Cardiomediastinal silhouette is stable.  No acute infiltrate or pleural effusion.  No pulmonary edema.  Mild perihilar increased bronchial markings without focal consolidation.  IMPRESSION: .  No acute infiltrate or pulmonary edema.  Mild perihilar increased bronchial markings without focal consolidation.    Assessment and Plan:  1.   Asthma exacerbation:  Reports a normal peak flow 400. Measured peak flow in ED 300 (75%) after methylprednisolone, magnesium, and breathing treatments. In her medical history  section, there is documentation of COPD. Its quite unlikely that this 42 year old has COPD. She was a former smoker but quit in 2009. She is followed by Dr. Maple Hudson of pulmonology and none of her documentation mentions COPD. She receives every other week omalizumab injections for allergies. I think the precipitant of this exacerbation is probably a viral URI and/or a viral bronchitis - Admit to regular floor - Scheduled albuterol and ipratropium nebulized treatment - Replace home Symbicort with mometasone-formoterol 2 puffs twice a day - Prednisone 60 mg daily - Trend peak flow measurements to guide treatment  2.   Viral URI:  Symptoms of headache, sinonasal congestion, sore throat, muscle aches, and dry cough are consistent with a viral URI or an acute viral bronchitis. She has a sick contact in her nephew. Since viruses are overwhelmingly the most common cause of upper respiratory infections and acute bronchitis, no antibiotic therapy is indicated.  We will treat her asthma as above and target therapy at symptom management.  - Guaifenesin-dextromethorphan 100-10 mg/5 mL, 5-10 mL every 4 hours as needed for cough and congestion - Hydrocodone-acetaminophen 5-325 mg every 4 hours as needed for moderate pain - Acetaminophen 625 mg every 6 hours as needed for headache and mild pain - Continue loratadine 10 mg daily - Influenza PCR and droplet precautions  3.   Hypokalemia:  Potassium 3.4. No report of diarrhea or vomiting; GI losses unlikely. Her overuse of albuterol over the past 5 days has undoubtedly caused trans-cellular shift of potassium out of her blood. Corticosteroid therapy can induce kaliuresis and is likely contributing. She is also taking hydrochlorothiazide at home which can induce kaliuresis. Bicarbonate of 25 is reassuring that her acid base status is normal.  4.   Morbid obesity:  BMI is 53.4.  5.   Hypertension:  At home, she takes hydrochlorothiazide 25 mg and valsartan 160 mg every day. Her blood pressure ranged over the last 6 months has been 167-120/101-83. Average blood pressure appears to be about 155/90. Blood pressure ranged today he has been 152-127/95-73. No evidence of renal insufficiency. We will continue her home regimen. - Continue hydrochlorothiazide 25 mg daily - Replace valsartan with formulary irbesartan 75 mg daily  6.   Prophylaxis:   enoxaparin 40 mg daily      Signed by:  Dorthula Rue. Earlene Plater, MD PGY-I, Internal Medicine  05/22/2012, 8:46 PM

## 2012-05-22 NOTE — ED Provider Notes (Signed)
History     CSN: [id number]  Arrival date & time 05/22/12  1632   First MD Initiated Contact with Patient 05/22/12 1642      Chief Complaint  Patient presents with  . Asthma    (Consider location/radiation/quality/duration/timing/severity/associated sxs/prior treatment) HPI Comments: This is a 42 year old female, who presents to the emergency department with chief complaint of asthma exacerbation. Patient has been seen 4 times within the last week for the same. She states that she has been taking her inhaler and prednisone and azithromycin as prescribed. Her symptoms, however, are unrelieved. She states that she feels better when she leaves the hospital, but then her symptoms will come back and she is unable to sleep. She states that she called her PCP, who told her to come to the emergency department began today. Her level of discomfort is moderate. Nothing makes her symptoms better or worse. She denies chest pain, productive cough, and and fever.  The history is provided by the patient. No language interpreter was used.    Past Medical History  Diagnosis Date  . Acanthosis nigricans   . Menorrhagia   . Asthma with exacerbation   . Hypertension, essential   . Pain, dental   . Insomnia   . Morbid obesity   . Helicobacter pylori (H. pylori) infection   . Sleep apnea   . COPD (chronic obstructive pulmonary disease)   . Asthma     skin test 02/01/09 pos hemorrhoids  . Depression   . GERD (gastroesophageal reflux disease)   . Right wrist pain   . Tobacco user   . Depression     Past Surgical History  Procedure Date  . Tubal ligation 1996    bilateral  . Breast reduction surgery 09/2010    Family History  Problem Relation Age of Onset  . Asthma Daughter   . Cancer Paternal Aunt   . Asthma Maternal Grandmother   . Hypertension Mother     History  Substance Use Topics  . Smoking status: Former Smoker    Types: Cigarettes    Quit date: 06/26/2007  . Smokeless  tobacco: Never Used  . Alcohol Use: No    OB History    Grav Para Term Preterm Abortions TAB SAB Ect Mult Living                  Review of Systems  All other systems reviewed and are negative.    Allergies  Ace inhibitors; Percocet; and Zolpidem tartrate  Home Medications   Current Outpatient Rx  Name  Route  Sig  Dispense  Refill  . ALBUTEROL SULFATE HFA 108 (90 BASE) MCG/ACT IN AERS   Inhalation   Inhale 2 puffs into the lungs every 4 (four) hours as needed. For wheezing, shortness of breath.         . ALBUTEROL SULFATE (2.5 MG/3ML) 0.083% IN NEBU   Nebulization   Take 2.5 mg by nebulization every 4 (four) hours as needed. For wheeze or shortness of breath         . BUDESONIDE-FORMOTEROL FUMARATE 160-4.5 MCG/ACT IN AERO   Inhalation   Inhale 2 puffs into the lungs 2 (two) times daily.         [name] DICLOFENAC SODIUM 1 % TD GEL   Topical   Apply 4 g topically 4 (four) times daily.   3 Tube   0   . FLUTICASONE-SALMETEROL 500-50 MCG/DOSE IN AEPB   Inhalation   Inhale 2 puffs into the  lungs every 12 (twelve) hours.         [name] HYDROCHLOROTHIAZIDE 25 MG PO TABS   Oral   Take 25 mg by mouth daily.         . IBUPROFEN 600 MG PO TABS   Oral   Take 1 tablet (600 mg total) by mouth every 8 (eight) hours as needed. For pain   60 tablet   3   . LORATADINE 10 MG PO TABS   Oral   Take 1 tablet (10 mg total) by mouth daily.         . OMALIZUMAB 150 MG [place] SOLR   Subcutaneous   Inject 225 mg into the skin every 14 (fourteen) days.         . OMEPRAZOLE MAGNESIUM 20 MG PO TBEC   Oral   Take 20-40 mg by mouth 2 (two) times daily.         [name] PREDNISONE 20 MG PO TABS   Oral   Take 1 tablet (20 mg total) by mouth daily. Take 3 tabs day 1, 2 tabs day 2 & 3, then 1 tab day 4-7   11 tablet   0   . VALSARTAN 160 MG PO TABS   Oral   Take 1 tablet (160 mg total) by mouth daily.   30 tablet   11   . EPINEPHRINE 0.3 MG/0.3ML IJ DEVI   Intramuscular    Inject 0.3 mg into the muscle as needed. For severe allergic reaction            BP 127/85  Pulse 100  Temp 98.4 F (36.9 C) (Oral)  Resp 17  SpO2 99%  LMP 05/09/2012  Physical Exam  Nursing note and vitals reviewed. Constitutional: She is oriented to person, place, and time. She appears well-developed and well-nourished.  HENT:  Head: Normocephalic and atraumatic.  Eyes: Conjunctivae normal and EOM are normal. Pupils are equal, round, and reactive to light.  Neck: Normal range of motion. Neck supple.  Cardiovascular: Normal rate and regular rhythm.  Exam reveals no gallop and no friction rub.   No murmur heard. Pulmonary/Chest: Effort normal. No respiratory distress. She has wheezes. She has no rales. She exhibits no tenderness.       Diffuse expiratory wheezes, however patient has not in any apparent respiratory distress.  Abdominal: Soft. Bowel sounds are normal. She exhibits no distension and no mass. There is no tenderness. There is no rebound and no guarding.  Musculoskeletal: Normal range of motion. She exhibits no edema and no tenderness.  Neurological: She is alert and oriented to person, place, and time.  Skin: Skin is warm and dry.  Psychiatric: She has a normal mood and affect. Her behavior is normal. Judgment and thought content normal.    ED Course  Procedures (including critical care time)   Labs Reviewed  CBC WITH DIFFERENTIAL  BASIC METABOLIC PANEL   Results for orders placed during the hospital encounter of 05/22/12  CBC WITH DIFFERENTIAL      Component Value Range   WBC 18.1 (*) 4.0 - 10.5 K/uL   RBC 4.32  3.87 - 5.11 MIL/uL   Hemoglobin 12.0  12.0 - 15.0 g/dL   HCT 16.1  09.6 - 04.5 %   MCV 86.1  78.0 - 100.0 fL   MCH 27.8  26.0 - 34.0 pg   MCHC 32.3  30.0 - 36.0 g/dL   RDW 40.9 (*) 81.1 - 91.4 %   Platelets 365  150 -  400 K/uL   Neutrophils Relative 63  43 - 77 %   Neutro Abs 11.4 (*) 1.7 - 7.7 K/uL   Lymphocytes Relative 27  12 - 46 %    Lymphs Abs 4.9 (*) 0.7 - 4.0 K/uL   Monocytes Relative 9  3 - 12 %   Monocytes Absolute 1.7 (*) 0.1 - 1.0 K/uL   Eosinophils Relative 0  0 - 5 %   Eosinophils Absolute 0.0  0.0 - 0.7 K/uL   Basophils Relative 0  0 - 1 %   Basophils Absolute 0.0  0.0 - 0.1 K/uL  BASIC METABOLIC PANEL      Component Value Range   Sodium 138  135 - 145 mEq/L   Potassium 3.4 (*) 3.5 - 5.1 mEq/L   Chloride 102  96 - 112 mEq/L   CO2 25  19 - 32 mEq/L   Glucose, Bld 95  70 - 99 mg/dL   BUN 10  6 - 23 mg/dL   Creatinine, Ser 1.61  0.50 - 1.10 mg/dL   Calcium 8.4  8.4 - 09.6 mg/dL   GFR calc non Af Amer >90  >90 mL/min   GFR calc Af Amer >90  >90 mL/min   Dg Chest 2 View  05/22/2012  *RADIOLOGY REPORT*  Clinical Data: Shortness of breath, asthma  CHEST - 2 VIEW  Comparison: 05/20/2012  Findings: Cardiomediastinal silhouette is stable.  No acute infiltrate or pleural effusion.  No pulmonary edema.  Mild perihilar increased bronchial markings without focal consolidation.  IMPRESSION: .  No acute infiltrate or pulmonary edema.  Mild perihilar increased bronchial markings without focal consolidation.   Original Report Authenticated By: Natasha Mead, M.D.    Dg Chest 2 View  05/20/2012  *RADIOLOGY REPORT*  Clinical Data: Shortness of breath; respiratory distress.  CHEST - 2 VIEW  Comparison: Chest radiograph performed 05/17/2012  Findings: The lungs are well-aerated.  Mild vascular congestion is noted.  Mild left basilar opacity could reflect atelectasis or possibly mild pneumonia.  Mild peribronchial thickening is seen. There is no evidence of pleural effusion or pneumothorax.  The heart is normal in size; the mediastinal contour is within normal limits.  No acute osseous abnormalities are seen.  IMPRESSION: Mild vascular congestion noted; mild peribronchial thickening seen. Mild left basilar airspace opacity could reflect atelectasis or possibly mild pneumonia.   Original Report Authenticated By: Tonia Ghent, M.D.    Dg  Chest 2 View  05/17/2012  *RADIOLOGY REPORT*  Clinical Data: Asthma; shortness of breath  CHEST - 2 VIEW  Comparison: February 26, 2012  Findings:  The lungs are clear.  The heart size and pulmonary vascularity are normal.  No adenopathy.  No bone lesions.  IMPRESSION: No abnormality noted.   Original Report Authenticated By: Bretta Bang, M.D.      Dg Chest 2 View  05/22/2012  *RADIOLOGY REPORT*  Clinical Data: Shortness of breath, asthma  CHEST - 2 VIEW  Comparison: 05/20/2012  Findings: Cardiomediastinal silhouette is stable.  No acute infiltrate or pleural effusion.  No pulmonary edema.  Mild perihilar increased bronchial markings without focal consolidation.  IMPRESSION: .  No acute infiltrate or pulmonary edema.  Mild perihilar increased bronchial markings without focal consolidation.   Original Report Authenticated By: Natasha Mead, M.D.      1. Asthma exacerbation   2. Esophageal reflux   3. Hypokalemia   4. Morbid obesity   5. Unspecified essential hypertension       MDM  42 year old female  with asthma exacerbation. Patient has been seen 4 times in the past week for the same. She has been receiving prednisone, nebulizer treatments, inhaler treatments, and azithromycin with no relief. After attempting to help her symptoms today with nebulizer treatment and IV Solu-Medrol, she is still feeling poorly. I have ordered basic labs, so that she can be admitted to the hospital for this persistent asthma exacerbation. Previous notes and labs have been reviewed, doubt any pneumonia, or PE.  8:36 PM After discussing this patient with Dr. Jeraldine Loots, who also saw the patient personally, I am going to admit the patient for recurrent asthma exacerbations.       Roxy Horseman, PA-C 05/22/12 2036

## 2012-05-22 NOTE — Telephone Encounter (Signed)
Pt calls and states she is going to ED again, states she can never get an appt in clinic she states she needs to be admitted this time that her breathing is worse, she does not sound short of breath but does sound congested, she is advised if she feels it is urgent yes to go to ED, she is reminded of her appt 1/28, she states she will probably be in the hosp, she is urged to go now, and states she will

## 2012-05-22 NOTE — ED Notes (Signed)
Pt states feeling some better after cont neb tx.  Pt ambulatory to br without difficulty.

## 2012-05-22 NOTE — ED Notes (Signed)
Patient to ED with C/O asthma.  States that she has been seen here for this several times this week but with out improvement.

## 2012-05-22 NOTE — ED Notes (Signed)
Report given to floor.  Pt transported via stretcher to floor.  Nad.

## 2012-05-23 DIAGNOSIS — R05 Cough: Secondary | ICD-10-CM

## 2012-05-23 DIAGNOSIS — R059 Cough, unspecified: Secondary | ICD-10-CM

## 2012-05-23 LAB — INFLUENZA PANEL BY PCR (TYPE A & B): H1N1 flu by pcr: NOT DETECTED

## 2012-05-23 LAB — BASIC METABOLIC PANEL
CO2: 28 mEq/L (ref 19–32)
Chloride: 102 mEq/L (ref 96–112)
Creatinine, Ser: 0.62 mg/dL (ref 0.50–1.10)
Glucose, Bld: 141 mg/dL — ABNORMAL HIGH (ref 70–99)

## 2012-05-23 LAB — CBC
HCT: 38.6 % (ref 36.0–46.0)
MCV: 86.7 fL (ref 78.0–100.0)
RDW: 15.7 % — ABNORMAL HIGH (ref 11.5–15.5)
WBC: 16.7 10*3/uL — ABNORMAL HIGH (ref 4.0–10.5)

## 2012-05-23 MED ORDER — TRAMADOL HCL 50 MG PO TABS: 50.0000 mg | ORAL_TABLET | Freq: Four times a day (QID) | ORAL | Status: DC | PRN

## 2012-05-23 MED ORDER — PREDNISONE 20 MG PO TABS: ORAL_TABLET | ORAL | Status: DC

## 2012-05-23 MED ORDER — ALBUTEROL SULFATE HFA 108 (90 BASE) MCG/ACT IN AERS
1.0000 | INHALATION_SPRAY | Freq: Four times a day (QID) | RESPIRATORY_TRACT | Status: DC | PRN
  Administered 2012-05-23: 2 via RESPIRATORY_TRACT
  Filled 2012-05-23: qty 6.7

## 2012-05-23 MED ORDER — WHITE PETROLATUM GEL
Status: AC
  Filled 2012-05-23: qty 5

## 2012-05-23 MED ORDER — MOMETASONE FURO-FORMOTEROL FUM 100-5 MCG/ACT IN AERO
2.0000 | INHALATION_SPRAY | Freq: Two times a day (BID) | RESPIRATORY_TRACT | Status: DC
  Administered 2012-05-23: 2 via RESPIRATORY_TRACT
  Filled 2012-05-23: qty 8.8

## 2012-05-23 NOTE — Discharge Summary (Signed)
INTERNAL MEDICINE TEACHING SERVICE Attending Note  Date: 05/23/2012  Patient name: April [name]  Medical record number: [id number]  Date of birth: 09-25-1970    This patient has been seen and discussed with the house staff. Please see their note for complete details. I concur with their findings and plan. Improved condition today. Discharge with slow prednisone taper and follow up in clinic and with Pulmonary, Dr. Maple Hudson.  Jonah Blue, DO  05/23/2012, 3:25 PM

## 2012-05-23 NOTE — H&P (Signed)
INTERNAL MEDICINE TEACHING SERVICE Attending Admission Note  Date: 05/23/2012  Patient name: April [name]  Medical record number: [id number]  Date of birth: 06/26/1970    I have seen and evaluated April [name] and discussed their care with the Residency Team.  42 year old female with a past medical history significant for morbid city, allergies leading to asthma attacks, moderate persistent asthma, GERD, hypertension, presented with a one-week history of worsening shortness of breath. She stated she also had headaches, congestion, nausea, myalgias, and had a recent sick contact. She was seen in the emergency department and diagnosed with a viral upper or infection and worsening asthma and given a 7 day taper of prednisone. She was seen in the emergency department again a few days later at that time diagnosed with a COPD exacerbation and given azithromycin, which she did not fill. She again was seen at a later in the ED and diagnosed with reactive airway disease and not discharge him with any further prescriptions told to fill azithromycin and prednisone. There is question as to whether or not she filled the prednisone prescription, but she likely did not complete an adequate taper. She also never filled the azithromycin prescription. She was treated overnight with albuterol and ipratropium, started on prednisone, and her peak flow was followed. This morning she feels well and wants to go home.  Physical Exam: Blood pressure 171/89, pulse 75, temperature 98.2 F (36.8 C), temperature source Oral, resp. rate 20, height 5\' 6"  (1.676 m), weight 342 lb (155.13 kg), last menstrual period 05/09/2012, SpO2 100.00%.  General: Vital signs reviewed and noted. Well-developed, well-nourished, in no acute distress; alert, appropriate and cooperative throughout examination.  Head: Normocephalic, atraumatic.  Eyes: PERRL, EOMI, No signs of anemia or jaundince.  Nose: Mucous membranes moist, not inflammed,  nonerythematous.  Throat: Oropharynx nonerythematous, no exudate appreciated.   Neck: No deformities, masses, or tenderness noted.Supple, No carotid Bruits, no JVD.  Lungs:   minimal bilateral scattered expiratory wheeze.   Heart: RRR. S1 and S2 normal without gallop, murmur, or rubs.  Abdomen:  BS normoactive. Soft, Nondistended, non-tender.  No masses or organomegaly.  Extremities: No pretibial edema.  Neurologic: A&O X3, CN II - XII are grossly intact. Motor strength is 5/5 in the all 4 extremities, Sensations intact to light touch, Cerebellar signs negative.  Skin: No visible rashes, scars.    Lab results: Results for orders placed during the hospital encounter of 05/22/12 (from the past 24 hour(s))  CBC WITH DIFFERENTIAL     Status: Abnormal   Collection Time   05/22/12  5:58 PM      Component Value Range   WBC 18.1 (*) 4.0 - 10.5 K/uL   RBC 4.32  3.87 - 5.11 MIL/uL   Hemoglobin 12.0  12.0 - 15.0 g/dL   HCT 29.5  28.4 - 13.2 %   MCV 86.1  78.0 - 100.0 fL   MCH 27.8  26.0 - 34.0 pg   MCHC 32.3  30.0 - 36.0 g/dL   RDW 44.0 (*) 10.2 - 72.5 %   Platelets 365  150 - 400 K/uL   Neutrophils Relative 63  43 - 77 %   Neutro Abs 11.4 (*) 1.7 - 7.7 K/uL   Lymphocytes Relative 27  12 - 46 %   Lymphs Abs 4.9 (*) 0.7 - 4.0 K/uL   Monocytes Relative 9  3 - 12 %   Monocytes Absolute 1.7 (*) 0.1 - 1.0 K/uL   Eosinophils Relative 0  0 -  5 %   Eosinophils Absolute 0.0  0.0 - 0.7 K/uL   Basophils Relative 0  0 - 1 %   Basophils Absolute 0.0  0.0 - 0.1 K/uL  BASIC METABOLIC PANEL     Status: Abnormal   Collection Time   05/22/12  5:58 PM      Component Value Range   Sodium 138  135 - 145 mEq/L   Potassium 3.4 (*) 3.5 - 5.1 mEq/L   Chloride 102  96 - 112 mEq/L   CO2 25  19 - 32 mEq/L   Glucose, Bld 95  70 - 99 mg/dL   BUN 10  6 - 23 mg/dL   Creatinine, Ser 4.09  0.50 - 1.10 mg/dL   Calcium 8.4  8.4 - 81.1 mg/dL   GFR calc non Af Amer >90  >90 mL/min   GFR calc Af Amer >90  >90 mL/min    HEPATIC FUNCTION PANEL     Status: Abnormal   Collection Time   05/22/12  5:58 PM      Component Value Range   Total Protein 6.7  6.0 - 8.3 g/dL   Albumin 3.1 (*) 3.5 - 5.2 g/dL   AST 20  0 - 37 U/L   ALT 19  0 - 35 U/L   Alkaline Phosphatase 78  39 - 117 U/L   Total Bilirubin 0.1 (*) 0.3 - 1.2 mg/dL   Bilirubin, Direct <9.1  0.0 - 0.3 mg/dL   Indirect Bilirubin NOT CALCULATED  0.3 - 0.9 mg/dL  MAGNESIUM     Status: Normal   Collection Time   05/22/12  5:58 PM      Component Value Range   Magnesium 2.0  1.5 - 2.5 mg/dL  INFLUENZA PANEL BY PCR     Status: Normal   Collection Time   05/22/12 11:31 PM      Component Value Range   Influenza A By PCR NEGATIVE  NEGATIVE   Influenza B By PCR NEGATIVE  NEGATIVE   H1N1 flu by pcr NOT DETECTED  NOT DETECTED  BASIC METABOLIC PANEL     Status: Abnormal   Collection Time   05/23/12  6:30 AM      Component Value Range   Sodium 138  135 - 145 mEq/L   Potassium 4.2  3.5 - 5.1 mEq/L   Chloride 102  96 - 112 mEq/L   CO2 28  19 - 32 mEq/L   Glucose, Bld 141 (*) 70 - 99 mg/dL   BUN 11  6 - 23 mg/dL   Creatinine, Ser 4.78  0.50 - 1.10 mg/dL   Calcium 8.6  8.4 - 29.5 mg/dL   GFR calc non Af Amer >90  >90 mL/min   GFR calc Af Amer >90  >90 mL/min  CBC     Status: Abnormal   Collection Time   05/23/12  6:30 AM      Component Value Range   WBC 16.7 (*) 4.0 - 10.5 K/uL   RBC 4.45  3.87 - 5.11 MIL/uL   Hemoglobin 12.3  12.0 - 15.0 g/dL   HCT 62.1  30.8 - 65.7 %   MCV 86.7  78.0 - 100.0 fL   MCH 27.6  26.0 - 34.0 pg   MCHC 31.9  30.0 - 36.0 g/dL   RDW 84.6 (*) 96.2 - 95.2 %   Platelets 366  150 - 400 K/uL    Imaging results:  Dg Chest 2 View  05/22/2012  *RADIOLOGY REPORT*  Clinical Data: Shortness of breath, asthma  CHEST - 2 VIEW  Comparison: 05/20/2012  Findings: Cardiomediastinal silhouette is stable.  No acute infiltrate or pleural effusion.  No pulmonary edema.  Mild perihilar increased bronchial markings without focal consolidation.   IMPRESSION: .  No acute infiltrate or pulmonary edema.  Mild perihilar increased bronchial markings without focal consolidation.   Original Report Authenticated By: Natasha Mead, M.D.      Assessment and Plan: I agree with the formulated Assessment and Plan with the following changes:  42 year old female with a past medical history significant for morbid city, allergies leading to asthma attacks, moderate persistent asthma, GERD, hypertension, presented with a one-week history of worsening shortness of breath, with asthma exacerbation. #1 asthma exacerbation: I would give her a prednisone taper starting at 60 mg to taper off slowly over the next few weeks. She should have albuterol prescription and Symbicort to use at home. She would need assistance with affording Symbicort, but can take her current hospital inhaler. I do not think that this is influenza. #2 leukocytosis: This is likely due to recent steroid therapy. Chest x-ray shows no infiltrates, and she likely has increased bronchial markings from her chronic condition. Currently stable to be discharged home.  Jonah Blue, DO 1/25/20142:12 PM

## 2012-05-23 NOTE — Discharge Summary (Signed)
Internal Medicine Teaching [facility] Discharge Note  Name: April [name] MRN: [id number] DOB: [birth year] 42 y.o.  Date of Admission: 05/22/2012  4:34 PM Date of Discharge: 05/23/2012 Attending Physician: Jonah Blue, DO  Discharge Diagnosis: Active Problems:  Morbid obesity with body mass index of 50.0-59.9 in adult  HYPERTENSION, ESSENTIAL NOS  Asthma exacerbation  Hypokalemia  Viral URI with cough  Discharge Medications:   Medication List     As of 05/23/2012 10:55 AM    STOP taking these medications         diclofenac sodium 1 % Gel   Commonly known as: VOLTAREN      ibuprofen 600 MG tablet   Commonly known as: ADVIL,MOTRIN      TAKE these medications         albuterol 108 (90 BASE) MCG/ACT inhaler   Commonly known as: PROVENTIL HFA;VENTOLIN HFA   Inhale 2 puffs into the lungs every 4 (four) hours as needed. For wheezing, shortness of breath.      albuterol (2.5 MG/3ML) 0.083% nebulizer solution   Commonly known as: PROVENTIL   Take 2.5 mg by nebulization every 4 (four) hours as needed. For wheeze or shortness of breath      budesonide-formoterol 160-4.5 MCG/ACT inhaler   Commonly known as: SYMBICORT   Inhale 2 puffs into the lungs 2 (two) times daily.      EPIPEN 0.3 mg/0.3 mL Devi   Generic drug: EPINEPHrine   Inject 0.3 mg into the muscle as needed. For severe allergic reaction      hydrochlorothiazide 25 MG tablet   Commonly known as: HYDRODIURIL   Take 25 mg by mouth daily.      loratadine 10 MG tablet   Commonly known as: CLARITIN   Take 1 tablet (10 mg total) by mouth daily.      omalizumab 150 MG injection   Commonly known as: XOLAIR   Inject 225 mg into the skin every 14 (fourteen) days.      omeprazole 20 MG tablet   Commonly known as: PRILOSEC OTC   Take 20-40 mg by mouth 2 (two) times daily.      predniSONE 20 MG tablet   Commonly known as: DELTASONE   Take 3 pills a day by mouth (all at once) for 4 days, then take 2 pills a day  for 4 days, then take 1 pill by mouth for 4 days then stop.      traMADol 50 MG tablet   Commonly known as: ULTRAM   Take 1 tablet (50 mg total) by mouth every 6 (six) hours as needed for pain.      valsartan 160 MG tablet   Commonly known as: DIOVAN   Take 1 tablet (160 mg total) by mouth daily.        Disposition and follow-up:   April [name] was discharged from [facility] in stable condition.  At the hospital follow up visit please address breathing and please have her follow up with her lung doctor Dr. Maple Hudson.   Follow-up Appointments:     Follow-up Information    Follow up with Denton Ar, MD. On 05/26/2012. (Be at the clinic at 9:00 AM for your appointment. Remember to ask to speak with the social worker. )    Solicitor information:   [address] [place] [address] [phone]         Discharge Orders    Future Appointments: Provider: Department: Dept Phone:  Center:   05/26/2012 9:15 AM Larey Seat, MD [facility] [phone] [facility]   05/27/2012 11:00 AM [facility] [phone] None   06/05/2012 3:45 PM [name], MD [facility] [phone] None     Future Orders Please Complete By Expires   Diet general      Call MD for:      Scheduling Instructions:   Difficulty breathing.   Activity as tolerated - No restrictions         Consultations:  None  Procedures Performed:  Dg Chest 2 View  05/22/2012  *RADIOLOGY REPORT*  Clinical Data: Shortness of breath, asthma  CHEST - 2 VIEW  Comparison: 05/20/2012  Findings: Cardiomediastinal silhouette is stable.  No acute infiltrate or pleural effusion.  No pulmonary edema.  Mild perihilar increased bronchial markings without focal consolidation.  IMPRESSION: .  No acute infiltrate or pulmonary edema.  Mild perihilar increased bronchial markings without focal consolidation.   Original Report Authenticated By:  Natasha Mead, M.D.    Admission HPI:  42 year old obese woman with asthma, presenting to the emergency department with 6 days of dyspnea. Onset was 5 days ago. At the same time, she had onset of headache, sinonasal congestion, excessive lacrimation, muscle aches, mild sore throat, and nausea. Her nephew has recently been sick with a cold and is just now getting better.  She was seen in the emergency department on the 19th (day of onset), was diagnosed with a viral URI and asthma exacerbation, and was discharged home with a prescription for a 7 day prednisone taper. She was seen in the ED 3 days later on the 22nd; this time she was diagnosed with a COPD exacerbation, and she was discharged home with a four-day prescription of azithromycin. A day later, she was seen again in the emergency department; this time, she was diagnosed with reactive airway disease and shortness of breath, and she was discharged home without any further prescriptions, but she was instructed to finish her course of azithromycin and prednisone.  She didn't fill her prednisone prescription, took a dose early this morning, and has one dose remaining. She never filled her prescription for azithromycin.   Hospital Course by problem list:   Asthma exacerbation - Patient had improvement of peak flow to 75% of her baseline and could have been discharged however had financial limitations to getting her home inhalers. She was given albuterol and dulera inhalers in the hospital that she can take home with her. She is also getting an extended prednisone taper over 2 weeks to give her time to resolve this viral URI prior to stopping steroids. She will follow up in the clinic and speak with social worker and financial counselor about any optimization of her options for getting medications. She will also follow up with Dr. Maple Hudson, her pulmonologist.    Morbid obesity with body mass index of 50.0-59.9 - Advised the patient that her obesity may be  adversely affecting her breathing and that weight loss could help her in the long term. She did not seem interested in weight loss.   HYPERTENSION - Patient was maintained on her home medications of HCTZ and losartan. She may need adjustment as out-patient  Hypokalemia - Resolved with no treament and had K of 4.2 on day of discharge. Likely was mildly low on admission (3.4) due to multiple treatments of albuterol.   Viral URI with cough - Came in with mild cough and no production to the  sputum. She did not have pneumonia on CXR on admission and likely outside window for tamiflu although clinically does not have any signs of the flu. So likely another respiratory virus is most likely.   Discharge Vitals:  BP 171/89  Pulse 75  Temp 98.2 F (36.8 C) (Oral)  Resp 20  Ht 5\' 6"  (1.676 m)  Wt 342 lb (155.13 kg)  BMI 55.20 kg/m2  SpO2 100%  LMP 05/09/2012  Discharge Labs:  Results for orders placed during the hospital encounter of 05/22/12 (from the past 24 hour(s))  CBC WITH DIFFERENTIAL     Status: Abnormal   Collection Time   05/22/12  5:58 PM      Component Value Range   WBC 18.1 (*) 4.0 - 10.5 K/uL   RBC 4.32  3.87 - 5.11 MIL/uL   Hemoglobin 12.0  12.0 - 15.0 g/dL   HCT 56.2  13.0 - 86.5 %   MCV 86.1  78.0 - 100.0 fL   MCH 27.8  26.0 - 34.0 pg   MCHC 32.3  30.0 - 36.0 g/dL   RDW 78.4 (*) 69.6 - 29.5 %   Platelets 365  150 - 400 K/uL   Neutrophils Relative 63  43 - 77 %   Neutro Abs 11.4 (*) 1.7 - 7.7 K/uL   Lymphocytes Relative 27  12 - 46 %   Lymphs Abs 4.9 (*) 0.7 - 4.0 K/uL   Monocytes Relative 9  3 - 12 %   Monocytes Absolute 1.7 (*) 0.1 - 1.0 K/uL   Eosinophils Relative 0  0 - 5 %   Eosinophils Absolute 0.0  0.0 - 0.7 K/uL   Basophils Relative 0  0 - 1 %   Basophils Absolute 0.0  0.0 - 0.1 K/uL  BASIC METABOLIC PANEL     Status: Abnormal   Collection Time   05/22/12  5:58 PM      Component Value Range   Sodium 138  135 - 145 mEq/L   Potassium 3.4 (*) 3.5 - 5.1 mEq/L     Chloride 102  96 - 112 mEq/L   CO2 25  19 - 32 mEq/L   Glucose, Bld 95  70 - 99 mg/dL   BUN 10  6 - 23 mg/dL   Creatinine, Ser 2.84  0.50 - 1.10 mg/dL   Calcium 8.4  8.4 - 13.2 mg/dL   GFR calc non Af Amer >90  >90 mL/min   GFR calc Af Amer >90  >90 mL/min  HEPATIC FUNCTION PANEL     Status: Abnormal   Collection Time   05/22/12  5:58 PM      Component Value Range   Total Protein 6.7  6.0 - 8.3 g/dL   Albumin 3.1 (*) 3.5 - 5.2 g/dL   AST 20  0 - 37 U/L   ALT 19  0 - 35 U/L   Alkaline Phosphatase 78  39 - 117 U/L   Total Bilirubin 0.1 (*) 0.3 - 1.2 mg/dL   Bilirubin, Direct <4.4  0.0 - 0.3 mg/dL   Indirect Bilirubin NOT CALCULATED  0.3 - 0.9 mg/dL  MAGNESIUM     Status: Normal   Collection Time   05/22/12  5:58 PM      Component Value Range   Magnesium 2.0  1.5 - 2.5 mg/dL  INFLUENZA PANEL BY PCR     Status: Normal   Collection Time   05/22/12 11:31 PM      Component Value Range  Influenza A By PCR NEGATIVE  NEGATIVE   Influenza B By PCR NEGATIVE  NEGATIVE   H1N1 flu by pcr NOT DETECTED  NOT DETECTED  BASIC METABOLIC PANEL     Status: Abnormal   Collection Time   05/23/12  6:30 AM      Component Value Range   Sodium 138  135 - 145 mEq/L   Potassium 4.2  3.5 - 5.1 mEq/L   Chloride 102  96 - 112 mEq/L   CO2 28  19 - 32 mEq/L   Glucose, Bld 141 (*) 70 - 99 mg/dL   BUN 11  6 - 23 mg/dL   Creatinine, Ser 1.61  0.50 - 1.10 mg/dL   Calcium 8.6  8.4 - 09.6 mg/dL   GFR calc non Af Amer >90  >90 mL/min   GFR calc Af Amer >90  >90 mL/min  CBC     Status: Abnormal   Collection Time   05/23/12  6:30 AM      Component Value Range   WBC 16.7 (*) 4.0 - 10.5 K/uL   RBC 4.45  3.87 - 5.11 MIL/uL   Hemoglobin 12.3  12.0 - 15.0 g/dL   HCT 04.5  40.9 - 81.1 %   MCV 86.7  78.0 - 100.0 fL   MCH 27.6  26.0 - 34.0 pg   MCHC 31.9  30.0 - 36.0 g/dL   RDW 91.4 (*) 78.2 - 95.6 %   Platelets 366  150 - 400 K/uL    Signed: Genella Mech 05/23/2012, 10:55 AM   Time Spent on  Discharge: 25 minutes Services Ordered on Discharge: none Equipment Ordered on Discharge: none

## 2012-05-23 NOTE — Progress Notes (Signed)
Subjective: The patient states that she is doing much better this morning. She was able to traverse the room without getting short of breath. She feels like she is back at her baseline with slight cough still. No fevers or chills overnight. No other complaints. No chest pain. No nausea or vomiting and she was able to tolerate breakfast.   Objective: Vital signs in last 24 hours: Filed Vitals:   05/22/12 2334 05/23/12 0532 05/23/12 0847 05/23/12 1000  BP:  160/96  171/89  Pulse:  80  75  Temp:  97.3 F (36.3 C)  98.2 F (36.8 C)  TempSrc:  Oral  Oral  Resp:  20  20  Height:      Weight:      SpO2: 99% 93% 100% 100%   Weight change:   Intake/Output Summary (Last 24 hours) at 05/23/12 1055 Last data filed at 05/23/12 0900  Gross per 24 hour  Intake    720 ml  Output      0 ml  Net    720 ml   General: resting in bed, pleasant, alert, no acute distress HEENT: PERRL, EOMI, no scleral icterus Cardiac: RRR, no rubs, murmurs or gallops Pulm: moving normal volumes of air, some scattered wheezing more in left than right lung Abd: soft, nontender, nondistended, BS present Ext: warm and well perfused, no pedal edema Neuro: alert and oriented X3, cranial nerves II-XII grossly intact  Lab Results: Basic Metabolic Panel:  Lab 05/23/12 1610 05/22/12 1758  NA 138 138  K 4.2 3.4*  CL 102 102  CO2 28 25  GLUCOSE 141* 95  BUN 11 10  CREATININE 0.62 0.70  CALCIUM 8.6 8.4  MG -- 2.0  PHOS -- --   Liver Function Tests:  Lab 05/22/12 1758  AST 20  ALT 19  ALKPHOS 78  BILITOT 0.1*  PROT 6.7  ALBUMIN 3.1*   CBC:  Lab 05/23/12 0630 05/22/12 1758 05/17/12 1624  WBC 16.7* 18.1* --  NEUTROABS -- 11.4* 7.3  HGB 12.3 12.0 --  HCT 38.6 37.2 --  MCV 86.7 86.1 --  PLT 366 365 --   Studies/Results: Dg Chest 2 View  05/22/2012  *RADIOLOGY REPORT*  Clinical Data: Shortness of breath, asthma  CHEST - 2 VIEW  Comparison: 05/20/2012  Findings: Cardiomediastinal silhouette is stable.   No acute infiltrate or pleural effusion.  No pulmonary edema.  Mild perihilar increased bronchial markings without focal consolidation.  IMPRESSION: .  No acute infiltrate or pulmonary edema.  Mild perihilar increased bronchial markings without focal consolidation.   Original Report Authenticated By: Natasha Mead, M.D.    Medications: I have reviewed the patient's current medications. Scheduled Meds:   . enoxaparin (LOVENOX) injection  40 mg Subcutaneous Daily  . hydrochlorothiazide  25 mg Oral Daily  . irbesartan  75 mg Oral Daily  . loratadine  10 mg Oral Daily  . mometasone-formoterol  2 puff Inhalation BID  . pantoprazole  40 mg Oral BID AC  . predniSONE  60 mg Oral Q breakfast  . sodium chloride  3 mL Intravenous Q12H   Continuous Infusions:  PRN Meds:.sodium chloride, acetaminophen, albuterol, sodium chloride Assessment/Plan:  Asthma exacerbation - Patient did well with breathing treatments overnight and is symptomatically better. She may have even been stable for D/C from ED however given financial difficulties was admitted overnight so she can take home inhalers used for her in the room. Will take home dulera and albuterol inhalers. Advised that prednisone course with taper  will be necessary to help her from getting another exacerbation. Advised to keep clinic follow up on Tuesday to talk with social worker and Research officer, political party as well as doctor. She should follow up with Dr. Maple Hudson (pulmonology) as well.   Morbid obesity with body mass index of 50.0-59.9 in adult - Advised the patient that her obesity may be adversely affecting her breathing and that weight loss could help her in the long term. She did not seem interested in weight loss.   HYPERTENSION, ESSENTIAL - Kept on home medications of HCTZ and losartan and advised compliance at home. Recheck BP at follow up visit in clinic.   Hypokalemia - Resolved and likely due to multiple treatments.   Viral URI with cough - Will give  extended taper and likely not the flu but outside window for treatment. CXR clear with no signs of pneumonia.   Dispo: Disposition is deferred at this time, awaiting improvement of current medical problems.  Anticipated discharge today.   The patient does have a current PCP (HO,MICHELE, MD), therefore is requiring [facility]PC follow-up after discharge.   The patient does not have transportation limitations that hinder transportation to clinic appointments.  .Services Needed at time of discharge: Y = Yes, Blank = No PT:   OT:   RN:   Equipment:   Other:     LOS: 1 day   Genella Mech 05/23/2012, 10:55 AM

## 2012-05-26 ENCOUNTER — Ambulatory Visit: Payer: Self-pay | Admitting: Internal Medicine

## 2012-05-26 ENCOUNTER — Telehealth: Payer: Self-pay | Admitting: Licensed Clinical Social Worker

## 2012-05-26 ENCOUNTER — Encounter: Payer: Self-pay | Admitting: Licensed Clinical Social Worker

## 2012-05-26 NOTE — Progress Notes (Signed)
Patient ID: April [name], female   DOB: 1970/09/23, 42 y.o.   MRN: [id number] See telephone note 05/26/2012

## 2012-05-26 NOTE — Telephone Encounter (Signed)
Ms. April [name] placed call to [facility] requesting information and assistance with applying for disability.  Pt states she had applied and was denied several years ago, pt did not appeal at that time.  CSW informed April [name], CSW can provide resources and steps to completing the disability application. Pt states she needs hand on assistance completing application, CSW informed April [name] has staff that will be able to assist as needed.  CSW can provide name and address of physicians, medications prescribed and diagnosis listing.  CSW provided April [name] with name and address to [employer] and [employer], as they have a disability advocate one day a week.  CSW provided pt with CSW contact information and hours.  Pt denies add'l needs at this time.

## 2012-05-27 ENCOUNTER — Ambulatory Visit: Payer: Self-pay

## 2012-05-29 NOTE — ED Provider Notes (Signed)
Medical screening examination/treatment/procedure(s) were performed by non-physician practitioner and as supervising physician I was immediately available for consultation/collaboration.    Kalieb Freeland L Toren Tucholski, MD 05/29/12 0005 

## 2012-06-05 ENCOUNTER — Ambulatory Visit: Payer: Self-pay | Admitting: Internal Medicine

## 2012-06-05 ENCOUNTER — Ambulatory Visit: Payer: Self-pay

## 2012-06-10 DIAGNOSIS — J45909 Unspecified asthma, uncomplicated: Secondary | ICD-10-CM

## 2012-06-15 ENCOUNTER — Ambulatory Visit (INDEPENDENT_AMBULATORY_CARE_PROVIDER_SITE_OTHER): Payer: Self-pay

## 2012-06-15 ENCOUNTER — Ambulatory Visit: Payer: Self-pay

## 2012-06-15 DIAGNOSIS — J45909 Unspecified asthma, uncomplicated: Secondary | ICD-10-CM

## 2012-06-16 MED ORDER — OMALIZUMAB 150 MG ~~LOC~~ SOLR
225.0000 mg | Freq: Once | SUBCUTANEOUS | Status: AC
  Administered 2012-06-16: 225 mg via SUBCUTANEOUS

## 2012-06-19 ENCOUNTER — Other Ambulatory Visit: Payer: Self-pay | Admitting: Internal Medicine

## 2012-06-29 ENCOUNTER — Ambulatory Visit (INDEPENDENT_AMBULATORY_CARE_PROVIDER_SITE_OTHER): Payer: Self-pay

## 2012-06-29 DIAGNOSIS — J45909 Unspecified asthma, uncomplicated: Secondary | ICD-10-CM

## 2012-06-30 MED ORDER — OMALIZUMAB 150 MG ~~LOC~~ SOLR
225.0000 mg | Freq: Once | SUBCUTANEOUS | Status: AC
  Administered 2012-06-30: 225 mg via SUBCUTANEOUS

## 2012-07-13 ENCOUNTER — Ambulatory Visit: Payer: Self-pay

## 2012-07-17 ENCOUNTER — Ambulatory Visit (INDEPENDENT_AMBULATORY_CARE_PROVIDER_SITE_OTHER): Payer: Commercial Managed Care - PPO | Admitting: *Deleted

## 2012-07-17 DIAGNOSIS — Z299 Encounter for prophylactic measures, unspecified: Secondary | ICD-10-CM

## 2012-07-17 DIAGNOSIS — Z111 Encounter for screening for respiratory tuberculosis: Secondary | ICD-10-CM

## 2012-07-20 ENCOUNTER — Ambulatory Visit (INDEPENDENT_AMBULATORY_CARE_PROVIDER_SITE_OTHER): Payer: Commercial Managed Care - PPO

## 2012-07-20 DIAGNOSIS — J45909 Unspecified asthma, uncomplicated: Secondary | ICD-10-CM

## 2012-07-21 MED ORDER — OMALIZUMAB 150 MG ~~LOC~~ SOLR
225.0000 mg | Freq: Once | SUBCUTANEOUS | Status: AC
  Administered 2012-07-21: 225 mg via SUBCUTANEOUS

## 2012-08-03 ENCOUNTER — Ambulatory Visit: Payer: Commercial Managed Care - PPO

## 2012-08-12 ENCOUNTER — Ambulatory Visit (INDEPENDENT_AMBULATORY_CARE_PROVIDER_SITE_OTHER): Payer: Commercial Managed Care - PPO

## 2012-08-12 DIAGNOSIS — J45909 Unspecified asthma, uncomplicated: Secondary | ICD-10-CM

## 2012-08-12 MED ORDER — OMALIZUMAB 150 MG ~~LOC~~ SOLR
225.0000 mg | Freq: Once | SUBCUTANEOUS | Status: AC
  Administered 2012-08-12: 225 mg via SUBCUTANEOUS

## 2012-09-02 ENCOUNTER — Ambulatory Visit (INDEPENDENT_AMBULATORY_CARE_PROVIDER_SITE_OTHER): Payer: Commercial Managed Care - PPO

## 2012-09-02 DIAGNOSIS — J45909 Unspecified asthma, uncomplicated: Secondary | ICD-10-CM

## 2012-09-03 MED ORDER — OMALIZUMAB 150 MG ~~LOC~~ SOLR
225.0000 mg | Freq: Once | SUBCUTANEOUS | Status: AC
  Administered 2012-09-03: 225 mg via SUBCUTANEOUS

## 2012-09-11 ENCOUNTER — Telehealth: Payer: Self-pay | Admitting: Internal Medicine

## 2012-09-11 NOTE — Telephone Encounter (Signed)
I called her pharm 09/07/12 they said they faxed [place] a paper that she needs a prior auth.. I didn't find a note in epic maybe I looked in the wrong place. Has this been taken care of?

## 2012-09-14 NOTE — Telephone Encounter (Signed)
PA information has been faxed and waiting on a decision from insurance company. PA forms in my red folder on my desk.

## 2012-09-15 NOTE — Telephone Encounter (Signed)
Noted. Thanks for letting me know where you keep them(prior auth's.) I'll know where to look now.

## 2012-09-16 ENCOUNTER — Ambulatory Visit: Payer: Commercial Managed Care - PPO

## 2012-09-22 ENCOUNTER — Telehealth: Payer: Self-pay | Admitting: Internal Medicine

## 2012-09-22 NOTE — Telephone Encounter (Signed)
Last OV 07-30-11, next ov set for 10-02-12. Pt is requesting samples of dulera and rescue inhaler. According to previous phone notes the pt does not qualify for patient assistance. Please advise if ok to give samples.  Carron Curie, CMA

## 2012-09-23 ENCOUNTER — Emergency Department (HOSPITAL_COMMUNITY)
Admission: EM | Admit: 2012-09-23 | Discharge: 2012-09-23 | Discharge status: Against Medical Advice | Payer: Commercial Managed Care - PPO | Attending: Emergency Medicine | Admitting: Emergency Medicine

## 2012-09-23 ENCOUNTER — Encounter (HOSPITAL_COMMUNITY): Payer: Self-pay | Admitting: Emergency Medicine

## 2012-09-23 DIAGNOSIS — J45909 Unspecified asthma, uncomplicated: Secondary | ICD-10-CM | POA: Insufficient documentation

## 2012-09-23 MED ORDER — MOMETASONE FURO-FORMOTEROL FUM 200-5 MCG/ACT IN AERO: 2.0000 | INHALATION_SPRAY | Freq: Two times a day (BID) | RESPIRATORY_TRACT | Status: DC

## 2012-09-23 MED ORDER — ALBUTEROL SULFATE (5 MG/ML) 0.5% IN NEBU
5.0000 mg | INHALATION_SOLUTION | Freq: Once | RESPIRATORY_TRACT | Status: AC
  Administered 2012-09-23: 5 mg via RESPIRATORY_TRACT
  Filled 2012-09-23: qty 1

## 2012-09-23 MED ORDER — ALBUTEROL SULFATE HFA 108 (90 BASE) MCG/ACT IN AERS: 2.0000 | INHALATION_SPRAY | RESPIRATORY_TRACT | Status: DC | PRN

## 2012-09-23 NOTE — ED Notes (Signed)
Pt did not answer when called  x2 

## 2012-09-23 NOTE — Telephone Encounter (Signed)
Spoke with pt and informed that sample of Dulera and Proventil were left at front desk.  Stressed to pt importance of keeping ov with CY next week.

## 2012-09-23 NOTE — Telephone Encounter (Signed)
Ok this time 

## 2012-09-23 NOTE — ED Notes (Signed)
PT. REPORTS PROGRESSING SOB WITH WHEEZING AND OCCASIONAL DRY COUGH ONSET THIS EVENING , PT. STATED SHE RAN OUT OF HER RESCUE MDI 3 DAYS AGO . DENIES FEVER .

## 2012-09-30 ENCOUNTER — Telehealth: Payer: Self-pay | Admitting: Internal Medicine

## 2012-09-30 NOTE — Telephone Encounter (Signed)
Spoke with April [name] @[employer]. Pt PA for  Xolair was approved on 09/10/12, an back dated from 07/22/12 until 12-29-12.we never received notice that meds was approved.  Per ann the PA's are only good for 6 months .they will not approve them for a yrly bases. April [name] spoke with [facility] an meds was on hold until  [facility] can get  A [facility] of the pt.Once [facility] talks to pt and gets permission to ship, they will ship meds within 24 hours.  Pt is aware.nothing further is needed at this time will close note.

## 2012-10-02 ENCOUNTER — Ambulatory Visit (INDEPENDENT_AMBULATORY_CARE_PROVIDER_SITE_OTHER): Payer: Commercial Managed Care - PPO | Admitting: Internal Medicine

## 2012-10-02 ENCOUNTER — Encounter: Payer: Self-pay | Admitting: Internal Medicine

## 2012-10-02 VITALS — BP 156/100 | HR 95 | Ht 66.0 in | Wt 350.4 lb

## 2012-10-02 DIAGNOSIS — J45901 Unspecified asthma with (acute) exacerbation: Secondary | ICD-10-CM

## 2012-10-02 DIAGNOSIS — J4541 Moderate persistent asthma with (acute) exacerbation: Secondary | ICD-10-CM

## 2012-10-02 MED ORDER — FLUTICASONE-SALMETEROL 250-50 MCG/DOSE IN AEPB: 1.0000 | INHALATION_SPRAY | Freq: Two times a day (BID) | RESPIRATORY_TRACT | Status: DC

## 2012-10-02 MED ORDER — CLONAZEPAM 0.5 MG PO TABS: ORAL_TABLET | ORAL | Status: DC

## 2012-10-02 MED ORDER — ALBUTEROL SULFATE HFA 108 (90 BASE) MCG/ACT IN AERS: 2.0000 | INHALATION_SPRAY | RESPIRATORY_TRACT | Status: DC | PRN

## 2012-10-02 NOTE — Progress Notes (Signed)
Patient ID: April [name], female    DOB: 01-21-1971, 42 y.o.   MRN: [id number]  HPI 5 yoF former smoker followed for chronic severe asthma. Hospitalized 4/11-12/12 for another exacerbation she says was triggered by catching a cold. Common triggers include colds and any smoke exposure. Back now to baseline. She has continued Xolair injection here every 2 weeks and does feel it has reduced frequency of flare-ups. Usual weight range 320-340 and she asks why weight is up to 360. Discussed steroids, including IV therapy at hospital but also ?role of Dulera 200-5. She is now being seen [facility] anticipating breast reduction surgery and we talked about bariatric surgery. Takes omeprazole twice daily before meals, but has frequent heart burn.   04/12/11- [name] former smoker followed for chronic severe asthma. Has had flu vaccine and pertussis vaccine. Says her breathing is okay. We had sent antibiotic for a cold with bronchitis around Thanksgiving. She denies any smoking at all now. She has avoided hospital partly because she has been better and partly because she is unwilling to go. Xolair has made a real improvement. She had breast reduction surgery without problems at [facility] and has considered bariatric surgery but can't afford it. Uses her nebulizer machine but not every day. Does continue Dulera and omeprazole. She is taking Ambien but still sleeps poorly at night. She no longer misses much work but that means she can't nap in the daytime. Complains of frequently waking at night but does not know if she snores.  04/25/11-  [name] former smoker followed for chronic severe asthma complicated by morbid obesity, HBP, depression. Her antidepressant was changed to Wellbutrin on December 18. Since then she can't sleep, using 3 Ambien per night. Increased asthma with wheezing. She increased to [facility] to 6 puffs daily. Had a cold at Thanksgiving but not since. She continues Xolair.  08/06/11- [name] former  smoker followed for chronic severe asthma complicated by morbid obesity, HBP, depression, GERD. Change jobs and insurance. Had a lapse of 2 weeks in her Xolair therapy but ready to restart. Work environment is clear and cool, indoors. Watery rhinorrhea not helped by 3 intermittent use of Flonase. No acute asthma attack but uses her Dulera and rescue inhaler every night at bedtime. Ambien helps sleep.  10/02/12- [name] former smoker followed for chronic severe asthma complicated by morbid obesity, HBP, depression, GERD. FOLLOWS FOR: had a few hosptial visits recently due to insurance and PA for Leandro Reasoner to have medication on Tuesday 10-06-12 and can resume Xolair then. Continues to have trouble sleeping (falling and staying) and would like to go back on Ambien if possible. Throat itch x several days, dry cough. ER again 2 weeks ago. Off Xolair x 5 weeks but has insurance ok now to restart. She started some left over prednisone. Aware occasional reflux- discussed precautions. CXR 05/22/12 IMPRESSION:  No acute infiltrate or pulmonary edema. Mild perihilar  increased bronchial markings without focal consolidation.  Original Report Authenticated By: Natasha Mead, M.D.  Review of Systems-See HPI Constitutional:   No-   weight loss, night sweats, fevers, chills, +fatigue, lassitude. HEENT:   No-  headaches, difficulty swallowing, tooth/dental problems, sore throat,       + sneezing, itching, ear ache, nasal congestion, post nasal drip,  CV:  No-   chest pain, orthopnea, PND, swelling in lower extremities, anasarca, dizziness, palpitations Resp: No-acute shortness of breath with exertion or at rest.  No-   productive cough,  N+ non-productive cough,  No- coughing up of blood.              No-   change in color of mucus. + wheezing.   Skin: No-   rash or lesions. GI:  No-   heartburn, indigestion, abdominal pain, nausea, vomiting,  GU: MS:  No-   joint pain or swelling.   Neuro-     nothing  unusual Psych:  No- change in mood or affect. No depression or anxiety.  No memory loss.    Objective:   Physical Exam General- Alert, Oriented, Affect-appropriate, Distress- none acute, + morbid obesity,  Skin- rash-none, lesions- none, excoriation- none Lymphadenopathy- none Head- atraumatic            Eyes- Gross vision intact, PERRLA, conjunctivae clear secretions            Ears- Hearing, canals-normal            Nose- Clear, no-Septal dev, mucus, polyps, erosion, perforation             Throat- Mallampati II , mucosa clear , drainage- none, tonsils- atrophic Neck- flexible , trachea midline, no stridor , thyroid nl, carotid no bruit Chest - symmetrical excursion , unlabored           Heart/CV- RRR , no murmur , no gallop  , no rub, nl s1 s2                           - JVD- none , edema- none, stasis changes- none, varices- none           Lung- Diminished, unlabored. No wheeze,+ mild cough, dullness-none, rub- none           Chest wall-  Abd-  Br/ Gen/ Rectal- Not done, not indicated Extrem- cyanosis- none, clubbing, none, atrophy- none, strength- nl Neuro- grossly intact to observation

## 2012-10-02 NOTE — Patient Instructions (Addendum)
Refill scripts for Advair 250 and for rescue inhaler sent to [facility]  Script for clonazepam to use a little before bedtime if needed for sleep  Get back on regular schedule with Xolair

## 2012-10-06 ENCOUNTER — Emergency Department (HOSPITAL_COMMUNITY): Payer: Commercial Managed Care - PPO

## 2012-10-06 ENCOUNTER — Encounter (HOSPITAL_COMMUNITY): Payer: Self-pay | Admitting: Nurse Practitioner

## 2012-10-06 ENCOUNTER — Ambulatory Visit: Payer: Commercial Managed Care - PPO

## 2012-10-06 ENCOUNTER — Observation Stay (HOSPITAL_COMMUNITY)
Admission: EM | Admit: 2012-10-06 | Discharge: 2012-10-07 | Discharge status: Against Medical Advice | Payer: Commercial Managed Care - PPO | Attending: Internal Medicine | Admitting: Internal Medicine

## 2012-10-06 ENCOUNTER — Telehealth: Payer: Self-pay | Admitting: Internal Medicine

## 2012-10-06 DIAGNOSIS — I10 Essential (primary) hypertension: Secondary | ICD-10-CM | POA: Insufficient documentation

## 2012-10-06 DIAGNOSIS — F17200 Nicotine dependence, unspecified, uncomplicated: Secondary | ICD-10-CM

## 2012-10-06 DIAGNOSIS — K219 Gastro-esophageal reflux disease without esophagitis: Secondary | ICD-10-CM | POA: Insufficient documentation

## 2012-10-06 DIAGNOSIS — Z87891 Personal history of nicotine dependence: Secondary | ICD-10-CM | POA: Diagnosis present

## 2012-10-06 DIAGNOSIS — J4541 Moderate persistent asthma with (acute) exacerbation: Secondary | ICD-10-CM

## 2012-10-06 DIAGNOSIS — Z9119 Patient's noncompliance with other medical treatment and regimen: Secondary | ICD-10-CM | POA: Insufficient documentation

## 2012-10-06 DIAGNOSIS — Z79899 Other long term (current) drug therapy: Secondary | ICD-10-CM | POA: Insufficient documentation

## 2012-10-06 DIAGNOSIS — J45901 Unspecified asthma with (acute) exacerbation: Secondary | ICD-10-CM | POA: Diagnosis present

## 2012-10-06 DIAGNOSIS — G4733 Obstructive sleep apnea (adult) (pediatric): Secondary | ICD-10-CM | POA: Insufficient documentation

## 2012-10-06 DIAGNOSIS — Z91199 Patient's noncompliance with other medical treatment and regimen due to unspecified reason: Secondary | ICD-10-CM | POA: Insufficient documentation

## 2012-10-06 DIAGNOSIS — Z9989 Dependence on other enabling machines and devices: Secondary | ICD-10-CM | POA: Diagnosis present

## 2012-10-06 DIAGNOSIS — J441 Chronic obstructive pulmonary disease with (acute) exacerbation: Principal | ICD-10-CM | POA: Insufficient documentation

## 2012-10-06 DIAGNOSIS — R9431 Abnormal electrocardiogram [ECG] [EKG]: Secondary | ICD-10-CM | POA: Diagnosis present

## 2012-10-06 DIAGNOSIS — Z6841 Body Mass Index (BMI) 40.0 and over, adult: Secondary | ICD-10-CM | POA: Insufficient documentation

## 2012-10-06 LAB — CBC
HCT: 37.4 % (ref 36.0–46.0)
MCHC: 33.4 g/dL (ref 30.0–36.0)
MCV: 84.4 fL (ref 78.0–100.0)
RDW: 16.1 % — ABNORMAL HIGH (ref 11.5–15.5)

## 2012-10-06 LAB — BASIC METABOLIC PANEL
BUN: 7 mg/dL (ref 6–23)
Creatinine, Ser: 0.81 mg/dL (ref 0.50–1.10)
GFR calc Af Amer: >90 mL/min (ref >90)
GFR calc non Af Amer: >90 mL/min (ref >90)
Glucose, Bld: 91 mg/dL (ref 70–99)

## 2012-10-06 LAB — DIFFERENTIAL
Basophils Absolute: 0 10*3/uL (ref 0.0–0.1)
Basophils Relative: 0 % (ref 0–1)
Eosinophils Absolute: 0.5 10*3/uL (ref 0.0–0.7)
Monocytes Absolute: 1 10*3/uL (ref 0.1–1.0)
Neutro Abs: 9.8 10*3/uL — ABNORMAL HIGH (ref 1.7–7.7)

## 2012-10-06 LAB — HEPATIC FUNCTION PANEL
AST: 18 U/L (ref 0–37)
Alkaline Phosphatase: 76 U/L (ref 39–117)
Bilirubin, Direct: <0.1 mg/dL (ref 0.0–0.3)
Total Bilirubin: 0.2 mg/dL — ABNORMAL LOW (ref 0.3–1.2)

## 2012-10-06 MED ORDER — SODIUM CHLORIDE 0.9 % IV BOLUS (SEPSIS)
1000.0000 mL | Freq: Once | INTRAVENOUS | Status: AC
  Administered 2012-10-06: 1000 mL via INTRAVENOUS

## 2012-10-06 MED ORDER — ACETAMINOPHEN 650 MG RE SUPP: 650.0000 mg | Freq: Four times a day (QID) | RECTAL | Status: DC | PRN

## 2012-10-06 MED ORDER — ACETAMINOPHEN 325 MG PO TABS: 650.0000 mg | ORAL_TABLET | Freq: Four times a day (QID) | ORAL | Status: DC | PRN

## 2012-10-06 MED ORDER — PREDNISONE 50 MG PO TABS
60.0000 mg | ORAL_TABLET | Freq: Every day | ORAL | Status: DC
  Filled 2012-10-06 (×2): qty 1

## 2012-10-06 MED ORDER — PANTOPRAZOLE SODIUM 40 MG PO TBEC: 40.0000 mg | DELAYED_RELEASE_TABLET | Freq: Every day | ORAL | Status: DC

## 2012-10-06 MED ORDER — ALBUTEROL SULFATE (5 MG/ML) 0.5% IN NEBU
5.0000 mg | INHALATION_SOLUTION | Freq: Once | RESPIRATORY_TRACT | Status: AC
  Administered 2012-10-06: 5 mg via RESPIRATORY_TRACT
  Filled 2012-10-06: qty 1

## 2012-10-06 MED ORDER — MAGNESIUM SULFATE 40 MG/ML IJ SOLN
2.0000 g | Freq: Once | INTRAMUSCULAR | Status: AC
  Administered 2012-10-06: 2 g via INTRAVENOUS
  Filled 2012-10-06: qty 50

## 2012-10-06 MED ORDER — SODIUM CHLORIDE 0.9 % IJ SOLN: 3.0000 mL | Freq: Two times a day (BID) | INTRAMUSCULAR | Status: DC

## 2012-10-06 MED ORDER — NICOTINE 21 MG/24HR TD PT24: 21.0000 mg | MEDICATED_PATCH | Freq: Once | TRANSDERMAL | Status: DC

## 2012-10-06 MED ORDER — ALBUTEROL (5 MG/ML) CONTINUOUS INHALATION SOLN
10.0000 mg/h | INHALATION_SOLUTION | RESPIRATORY_TRACT | Status: AC
  Administered 2012-10-06: 10 mg/h via RESPIRATORY_TRACT
  Filled 2012-10-06: qty 20

## 2012-10-06 MED ORDER — IPRATROPIUM BROMIDE 0.02 % IN SOLN: 0.5000 mg | Freq: Four times a day (QID) | RESPIRATORY_TRACT | Status: DC

## 2012-10-06 MED ORDER — ALBUTEROL SULFATE HFA 108 (90 BASE) MCG/ACT IN AERS
2.0000 | INHALATION_SPRAY | RESPIRATORY_TRACT | Status: DC | PRN
  Filled 2012-10-06: qty 6.7

## 2012-10-06 MED ORDER — POTASSIUM CHLORIDE CRYS ER 20 MEQ PO TBCR: 40.0000 meq | EXTENDED_RELEASE_TABLET | Freq: Once | ORAL | Status: DC

## 2012-10-06 MED ORDER — ENOXAPARIN SODIUM 40 MG/0.4ML ~~LOC~~ SOLN: 40.0000 mg | SUBCUTANEOUS | Status: DC

## 2012-10-06 MED ORDER — NICOTINE 7 MG/24HR TD PT24: 7.0000 mg | MEDICATED_PATCH | Freq: Every day | TRANSDERMAL | Status: DC

## 2012-10-06 MED ORDER — ALBUTEROL SULFATE (5 MG/ML) 0.5% IN NEBU: 2.5000 mg | INHALATION_SOLUTION | Freq: Four times a day (QID) | RESPIRATORY_TRACT | Status: DC

## 2012-10-06 MED ORDER — IRBESARTAN 150 MG PO TABS
150.0000 mg | ORAL_TABLET | Freq: Every day | ORAL | Status: DC
  Filled 2012-10-06: qty 1

## 2012-10-06 MED ORDER — IPRATROPIUM BROMIDE 0.02 % IN SOLN
0.5000 mg | Freq: Once | RESPIRATORY_TRACT | Status: AC
  Administered 2012-10-06: 0.5 mg via RESPIRATORY_TRACT
  Filled 2012-10-06: qty 2.5

## 2012-10-06 MED ORDER — SODIUM CHLORIDE 0.9 % IV SOLN: INTRAVENOUS | Status: DC

## 2012-10-06 MED ORDER — LORATADINE 10 MG PO TABS
10.0000 mg | ORAL_TABLET | Freq: Every day | ORAL | Status: DC
  Filled 2012-10-06: qty 1

## 2012-10-06 MED ORDER — PREDNISONE 20 MG PO TABS
60.0000 mg | ORAL_TABLET | Freq: Once | ORAL | Status: AC
  Administered 2012-10-06: 60 mg via ORAL
  Filled 2012-10-06: qty 3

## 2012-10-06 NOTE — H&P (Signed)
Date: 10/06/2012               Patient Name:  April [name] MRN: [id number]  DOB: [birth year] Age / Sex: 42 y.o., female   PCP: Mathis Dad, MD              Medical Service: Internal Medicine Teaching Service              Attending Physician: Dr. Farley Ly, MD    First Contact: Dr. Eileen Stanford Pager: [phone]  Second Contact: Dr. Dorise Hiss Pager: [phone]            After Hours (After 5p/  First Contact Pager: [phone]  weekends / holidays): Second Contact Pager: [phone]   Chief Complaint: SOB and cough   History of Present Illness:  42 year old woman with pmhx of morbid obesity, COPD,  HTN, tobacco abuse, depression and GERD, asthma with frequent exacerbations every 2-3 weeks (followed by Dr Maple Hudson of pulmonology, no prev intubations), presents with acute onset of SOB which happened on the day of admission. It was associated with wheezing and increased coughing. She tried her usual inhalers but this did not help her SOB and therefore presented to the ED. She usually uses her albuterol Q1hr however, over the last one week she is been using it 3-4x/hour and had also increased her Dulera use from 2 to 4 times day. Her albuterol canister last her one week. She reports recurrent cough and one episode of yellow sputum production which happened today, chest pain with coughing. She also reports dizziness and ' occasionally passing out for a few seconds when I cough too much''. She reports subjective fevers without chills. She reports feeling increasing fatigued over the past 2 days.   Since Jan 2011 she has intermittently been on Xolair injection Q2 weeks by Dr Maple Hudson. Has been having challenges affording this medication due to insurance coverage. Her last injection was 6 weeks ago. She has noticed that her SOB has worsened since being off Xollair. Most recently hospitalized 05/22/12-05/23/12 for asthma excerbation d/t viral URI. 2 weeks ago she was evaluated in the ED for acute asthma  exacerbation.  Peak expiratory flow rate of 200>>360 after she had received continuous albuterol nebulizing in the ED. She reports a baseline PEFR to be 400 -600.    Meds: Current Outpatient Prescriptions  Medication Sig Dispense Refill  . albuterol (PROVENTIL HFA;VENTOLIN HFA) 108 (90 BASE) MCG/ACT inhaler Inhale 2 puffs into the lungs every 4 (four) hours as needed for wheezing or shortness of breath. For wheezing, shortness of breath.  1 Inhaler  prn  . albuterol (PROVENTIL) (2.5 MG/3ML) 0.083% nebulizer solution Take 2.5 mg by nebulization every 4 (four) hours as needed. For wheeze or shortness of breath      . clonazePAM (KLONOPIN) 0.5 MG tablet 1 or 2 for sleep as needed  60 tablet  5  . EPINEPHrine (EPIPEN) 0.3 mg/0.3 mL DEVI Inject 0.3 mg into the muscle as needed. For severe allergic reaction       . Fluticasone-Salmeterol (ADVAIR DISKUS) 250-50 MCG/DOSE AEPB Inhale 1 puff into the lungs 2 (two) times daily. Rinse mouth  60 each  prn  . hydrochlorothiazide (HYDRODIURIL) 25 MG tablet Take 25 mg by mouth daily.      [name] loratadine (CLARITIN) 10 MG tablet Take 1 tablet (10 mg total) by mouth daily.      [name] omalizumab (XOLAIR) 150 MG injection Inject 225 mg into the skin every 14 (fourteen)  days.      . omeprazole (PRILOSEC OTC) 20 MG tablet Take 20-40 mg by mouth 2 (two) times daily.      . valsartan (DIOVAN) 160 MG tablet Take 1 tablet (160 mg total) by mouth daily.  30 tablet  11    Allergies: Allergies as of 10/06/2012 - Review Complete 10/06/2012  Allergen Reaction Noted  . Ace inhibitors  11/24/2009  . Percocet (oxycodone-acetaminophen) Itching 01/13/2012  . Zolpidem tartrate  05/28/2011   Past Medical History  Diagnosis Date  . Acanthosis nigricans   . Menorrhagia   . Hypertension, essential   . Insomnia   . Morbid obesity   . Helicobacter pylori (H. pylori) infection   . Sleep apnea     Sleep study 2008 - mild OSA, not enough events to titrate CPAP  . COPD (chronic  obstructive pulmonary disease)     PFTs in 2002, FEV1/FVC 65, no post bronchodilater test done  . Asthma     Followed by Dr. Maple Hudson (pulmonology); receives every other week omalizumab injections; has frequent exacerbations  . Depression   . GERD (gastroesophageal reflux disease)   . Tobacco user    Past Surgical History  Procedure Laterality Date  . Tubal ligation  1996    bilateral  . Breast reduction surgery  09/2011   Family History  Problem Relation Age of Onset  . Asthma Daughter   . Cancer Paternal Aunt   . Asthma Maternal Grandmother   . Hypertension Mother    History   Social History  . Marital Status: Divorced    Spouse Name: N/A    Number of Children: 1  . 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 Smoking status: Former Smoker    Types: Cigarettes    Quit date: 06/26/2007  . Smokeless tobacco: Never Used  . Alcohol Use: No  . Drug Use: No  . Sexually Active: Not on file   Other Topics Concern  . 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     Review of Systems: Constitutional: Denies diaphoresis. No appetite change. HEENT: Denies photophobia, eye pain, redness, hearing loss, ear pain. + Nasal congestion and itching in her throat. No sore throat, rhinorrhea, sneezing, mouth sores, trouble swallowing, neck pain, neck stiffness and tinnitus.  Cardiovascular: Denies palpitations and leg swelling.  Gastrointestinal: Denies nausea or abdominal pain. +alternating diarrhea and constipation, dark brown stools and occasional blood in stool without flank bleeding, she also reports heart burn. No and abdominal distention.  Genitourinary: Denies dysuria, urgency, frequency, hematuria, flank pain and difficulty urinating.  Musculoskeletal: Reports myalgias, back pain, joint  swelling, arthralgias - but these are chronic. No gait problem.  Skin: Denies pallor, rash and wound.  Neurological: Denies seizures, weakness, lightheadedness, numbness and headaches.  Hematological: Denies adenopathy. Easy bruising, personal or family bleeding history  Psychiatric/Behavioral: Denies suicidal ideation, mood changes, confusion, nervousness, sleep disturbance and agitation   Physical Exam: BP 158/106, PR106, Temp 98.2 F (36.8 C), RR 25, LNMP 10/04/2012, SpO2 96.00% on RA. General: Morbidly Obese, in moderate acute distressed, cooperative with exam Head: atraumatic, normocephalic,  Eye: pupils equal, round and reactive; sclera anicteric; normal conjunctiva  Nose/throat: oropharynx clear, moist mucous membranes, pink gums  Neck: supple, no carotid bruits, thyroid normal in  size and without palpable nodules  Lymph nodes: no cervical or supraclavicular lymphadenopathy  Lungs/Chest wall: Dyspneic, bilateral widespread wheezes with expiration. No use of accessory muscles of respiration.  Heart: normal rate and regular rhythm; no murmurs Pulses: radial and dorsalis pedis pulses are 2+ and symmetric  Abdomen: Normal fullness, no rebound, guarding, or rigidity; normal bowel sounds; no masses or organomegaly  Skin: warm, dry, intact, normal turgor, no rashes  Extremities: no peripheral edema, clubbing, or cyanosis Neurologic: A&O X3, CN II - XII are grossly intact. Motor strength is 5/5 in the all 4 extremities, Sensations intact to light touch, Cerebellar signs negative.  Psych: She reports poor sleep with snoring, increased day time somnolence. Her sleep study in 2008 indicated mild sleep apnea that did not require CPAP. She is alert and oriented, mood and affect are normal and congruent, thought content is normal without delusions, thought process is linear, speech is normal and non-pressured, behavior is normal   Lab results: Basic Metabolic Panel:  Recent Labs   10/06/12 1802  NA 141  K 3.6  CL 104  CO2 29  GLUCOSE 91  BUN 7  CREATININE 0.81  CALCIUM 8.8   CBC:  Recent Labs  10/06/12 1802  WBC 15.9*  NEUTROABS 9.8*  HGB 12.5  HCT 37.4  MCV 84.4  PLT 375   Urine Drug Screen: Drugs of Abuse     Component Value Date/Time   LABOPIA NONE DETECTED 03/04/2010 1242   COCAINSCRNUR NONE DETECTED 03/04/2010 1242   LABBENZ NONE DETECTED 03/04/2010 1242   AMPHETMU NONE DETECTED 03/04/2010 1242   THCU NONE DETECTED 03/04/2010 1242   LABBARB  Value: NONE DETECTED        DRUG SCREEN FOR MEDICAL PURPOSES ONLY.  IF CONFIRMATION IS NEEDED FOR ANY PURPOSE, NOTIFY LAB WITHIN 5 DAYS.        LOWEST DETECTABLE LIMITS FOR URINE DRUG SCREEN Drug Class       Cutoff (ng/mL) Amphetamine      1000 Barbiturate      200 Benzodiazepine   200 Tricyclics       300 Opiates          300 Cocaine          300 THC              50 03/04/2010 1242     Imaging results:  Dg Chest 2 View (if Patient Has Fever And/or Copd)  10/06/2012   *RADIOLOGY REPORT*  Clinical Data: Chest tightness, wheezing, shortness of breath, cough and history of asthma.  CHEST - 2 VIEW  Comparison: 05/22/2012  Findings: Stable mild chronic bronchial thickening without focal infiltrate.  Lung volumes are normal.  No pneumothorax, edema or pleural fluid is identified.  The heart size and mediastinal contours are within normal limits.  No bony abnormalities are seen.  IMPRESSION: Chronic bronchial thickening without focal infiltrate.   Original Report Authenticated By: Irish Lack, M.D.    Other results: EKG: normal sinus rhythm, sinus tachycardia, occasional PVC noted, unifocal, ST depression in inferior and lateral leads. TWIs not present on EKG in 04/2012.   Assessment & Plan by Problem: Principal Problem:   Asthma exacerbation Active Problems:   Morbid obesity with body mass index of 50.0-59.9 in adult   TOBACCO USER   HYPERTENSION, ESSENTIAL NOS   Esophageal reflux   Sleep apnea   T wave  inversion in EKG   42 year old woman with pmhx of morbid obesity, COPD,  HTN, tobacco  abuse, depression and GERD, asthma with frequent exacerbations every 2-3 weeks (followed by Dr Maple Hudson of pulmonology, no prev intubations), presents with acute onset of SOB which happened on the day of admission.  #Asthma Exacerbation: History of poorly controlled asthma and mild COPD from PFT in 2002, and medication noncompliance due to cost. Continued smoking is contributing to her symptoms and poor control of asthma. COPD exacerbation and URI are less likely. Lowest PEFR 200 (50% of her BL) 3 after nebs and steroid treatment and improved to 360 1 hour later. Home meds include albuterol inhaler and neb, Advair (sometimes Dulera or Symbicort), claritin and Xollair (intermittently since 2011). Followed by [facility] pulm. Sat O2 100%, and wheezing. CXR -Chronic bronchial thickening without focal infiltrate. In ED she had received Magnesium IV, 1L NS bolus, and albuterol/atrovent nebs.    Plan  - Admit to telemetry   - Continue PO prednisone 60mg  daily  - Scheduled DuoNebs - q6hours - Albuterol MDI q4hours prn - Hold Dulera MDI q12hr since on oral prednisone - Oxygen therapy to keep SO2 above 92% - NS 100cc/hr for 10 for insensible losses. Hold HCTZ - Monitor peak flow measurements to guide treatment - she was still tight with extensive wheezing even though had actually improved  - Xollair to be resumed as outpatient  #TWI in inferolateral Leads: No STE or STD. No chest pain. Risk factors - HTN, tobacco, morbidly obese, LDL 09/2009 123, HDL 47. Likely cardiac demand ischemia in setting of moderate asthma exacerbation  Plan  -Cycle CE x 2 (iStat Trop -negative) - Monitor for chest pain - Monitor on telemetry  - Repeat AM EKG   # HTN: BP currently stable. On HCTZ at home. Will hold hydrochlorothiazide. Consider restarting tomorrow.  # GERD: Protonix 40 daily.   #Tobacco Abuse: Contributing to asthma symptoms. Plan   Nicotine patch.  Smoking cessation counseling.   #VTE ppx: lovenox        Dispo: Disposition is deferred at this time, awaiting improvement of current medical problems. Anticipated discharge in approximately 1-2 day(s).   The patient does have a current PCP Mathis Dad, MD), therefore will be require OPC follow-up after discharge.   The patient does not have transportation limitations that hinder transportation to clinic appointments.   Signed:  Dow Adolph, MD PGY-1 Internal Medicine Teaching Service Pager: [phone] (7pm-7am) 10/06/2012, 11:47 PM

## 2012-10-06 NOTE — Telephone Encounter (Signed)
Noted and will forward to CY as well.

## 2012-10-06 NOTE — Telephone Encounter (Signed)
Noted  

## 2012-10-06 NOTE — ED Notes (Signed)
Peak Flow preformed on Pt. Flow of 200 achieved x3 with good effort.

## 2012-10-06 NOTE — ED Notes (Signed)
C/o asthma attack since yesterday. Home nebs are not helping. Was taken off her allergy shots 6 weeks ago. A&Ox4, wheezing in triage

## 2012-10-06 NOTE — ED Provider Notes (Signed)
History     CSN: [id number]  Arrival date & time 10/06/12  1707   First MD Initiated Contact with Patient 10/06/12 1735      Chief Complaint  Patient presents with  . Asthma    (Consider location/radiation/quality/duration/timing/severity/associated sxs/prior treatment) Patient is a 42 y.o. female presenting with asthma. The history is provided by the patient.  Asthma This is a recurrent problem. The current episode started yesterday. The problem occurs constantly. The problem has been gradually worsening. Associated symptoms include shortness of breath. Pertinent negatives include no abdominal pain. Associated symptoms comments: Wheezing and chest tightness. The symptoms are aggravated by walking and exertion. Relieved by: inhalers not working. Treatments tried: inhalers. The treatment provided no relief.    Past Medical History  Diagnosis Date  . Acanthosis nigricans   . Menorrhagia   . Asthma with exacerbation   . Hypertension, essential   . Pain, dental   . Insomnia   . Morbid obesity   . Helicobacter pylori (H. pylori) infection   . Sleep apnea   . COPD (chronic obstructive pulmonary disease)   . Asthma     Followed by Dr. Maple Hudson (pulmonology); receives every other week omalizumab injections  . Depression   . GERD (gastroesophageal reflux disease)   . Right wrist pain   . Tobacco user   . Depression     Past Surgical History  Procedure Laterality Date  . Tubal ligation  1996    bilateral  . Breast reduction surgery  09/2010    Family History  Problem Relation Age of Onset  . Asthma Daughter   . Cancer Paternal Aunt   . Asthma Maternal Grandmother   . Hypertension Mother     History  Substance Use Topics  . Smoking status: Former Smoker    Types: Cigarettes    Quit date: 06/26/2007  . Smokeless tobacco: Never Used  . Alcohol Use: No    OB History   Grav Para Term Preterm Abortions TAB SAB Ect Mult Living                  Review of Systems   Constitutional: Negative for fever.  Respiratory: Positive for cough, shortness of breath and wheezing.   Cardiovascular: Negative for leg swelling.  Gastrointestinal: Negative for nausea, vomiting and abdominal pain.  All other systems reviewed and are negative.    Allergies  Ace inhibitors; Percocet; and Zolpidem tartrate  Home Medications   Current Outpatient Rx  Name  Route  Sig  Dispense  Refill  . albuterol (PROVENTIL HFA;VENTOLIN HFA) 108 (90 BASE) MCG/ACT inhaler   Inhalation   Inhale 2 puffs into the lungs every 4 (four) hours as needed for wheezing or shortness of breath. For wheezing, shortness of breath.   1 Inhaler   prn   . albuterol (PROVENTIL) (2.5 MG/3ML) 0.083% nebulizer solution   Nebulization   Take 2.5 mg by nebulization every 4 (four) hours as needed. For wheeze or shortness of breath         . clonazePAM (KLONOPIN) 0.5 MG tablet      1 or 2 for sleep as needed   60 tablet   5   . EPINEPHrine (EPIPEN) 0.3 mg/0.3 mL DEVI   Intramuscular   Inject 0.3 mg into the muscle as needed. For severe allergic reaction          . Fluticasone-Salmeterol (ADVAIR DISKUS) 250-50 MCG/DOSE AEPB   Inhalation   Inhale 1 puff into the lungs 2 (two)  times daily. Rinse mouth   60 each   prn   . hydrochlorothiazide (HYDRODIURIL) 25 MG tablet   Oral   Take 25 mg by mouth daily.         [name] loratadine (CLARITIN) 10 MG tablet   Oral   Take 1 tablet (10 mg total) by mouth daily.         [name] omalizumab (XOLAIR) 150 MG injection   Subcutaneous   Inject 225 mg into the skin every 14 (fourteen) days.         [name] omeprazole (PRILOSEC OTC) 20 MG tablet   Oral   Take 20-40 mg by mouth 2 (two) times daily.         . valsartan (DIOVAN) 160 MG tablet   Oral   Take 1 tablet (160 mg total) by mouth daily.   30 tablet   11     BP 158/106  Pulse 106  Temp(Src) 98.2 F (36.8 C) (Oral)  Resp 25  SpO2 96%  LMP 08/05/2012  Physical Exam  Constitutional: She is  oriented to person, place, and time. She appears well-developed and well-nourished. No distress.  Obese female  HENT:  Head: Normocephalic and atraumatic.  Right Ear: External ear normal.  Left Ear: External ear normal.  Mouth/Throat: Oropharynx is clear and moist.  Eyes: Conjunctivae and EOM are normal. Pupils are equal, round, and reactive to light. Right eye exhibits no discharge.  Neck: Normal range of motion. Neck supple.  Cardiovascular: Normal rate, regular rhythm, normal heart sounds and intact distal pulses.   No murmur heard. Pulmonary/Chest: Tachypnea noted. No respiratory distress. She has no decreased breath sounds. She has wheezes. She has no rhonchi. She has no rales.  Abdominal: Soft. There is no tenderness.  Musculoskeletal: Normal range of motion. She exhibits no edema and no tenderness.  Neurological: She is alert and oriented to person, place, and time.  Skin: Skin is warm and dry. No rash noted.  Psychiatric: She has a normal mood and affect.    ED Course  Procedures (including critical care time)  Labs Reviewed  CBC - Abnormal; Notable for the following:    WBC 15.9 (*)    RDW 16.1 (*)    All other components within normal limits  BASIC METABOLIC PANEL  POCT I-STAT TROPONIN I   Dg Chest 2 View (if Patient Has Fever And/or Copd)  10/06/2012   *RADIOLOGY REPORT*  Clinical Data: Chest tightness, wheezing, shortness of breath, cough and history of asthma.  CHEST - 2 VIEW  Comparison: 05/22/2012  Findings: Stable mild chronic bronchial thickening without focal infiltrate.  Lung volumes are normal.  No pneumothorax, edema or pleural fluid is identified.  The heart size and mediastinal contours are within normal limits.  No bony abnormalities are seen.  IMPRESSION: Chronic bronchial thickening without focal infiltrate.   Original Report Authenticated By: Irish Lack, M.D.     1. Asthma exacerbation attacks, moderate persistent    CRITICAL CARE Performed by:  Gwyneth Sprout Total critical care time: 30 Critical care time was exclusive of separately billable procedures and treating other patients. Critical care was necessary to treat or prevent imminent or life-threatening deterioration. Critical care was time spent personally by me on the following activities: development of treatment plan with patient and/or surrogate as well as nursing, discussions with consultants, evaluation of patient's response to treatment, examination of patient, obtaining history from patient or surrogate, ordering and performing treatments and interventions, ordering and review of laboratory studies, ordering  and review of radiographic studies, pulse oximetry and re-evaluation of patient's condition.    MDM   Pt with typical asthma exacerbation  Symptoms which have been worsening since stopping allergy shots.  No infectious sx, productive cough or other complaints.  Wheezing on exam.  will give steroids, albuterol/atrovent and recheck.  6:12 PM CXR without acute findings.  After 2nd treatment still having wheezing and tachypnea.  Will give magnesium and fluids.         Gwyneth Sprout, MD 10/06/12 2012

## 2012-10-06 NOTE — ED Notes (Signed)
Respiratory therapy at bedside.

## 2012-10-06 NOTE — ED Notes (Signed)
Pt returned from xray

## 2012-10-06 NOTE — ED Notes (Signed)
Patient transported to X-ray 

## 2012-10-07 ENCOUNTER — Encounter (HOSPITAL_COMMUNITY): Payer: Self-pay | Admitting: Physical Medicine and Rehabilitation

## 2012-10-07 ENCOUNTER — Emergency Department (HOSPITAL_COMMUNITY): Payer: Commercial Managed Care - PPO

## 2012-10-07 ENCOUNTER — Telehealth: Payer: Self-pay | Admitting: Internal Medicine

## 2012-10-07 ENCOUNTER — Inpatient Hospital Stay (HOSPITAL_COMMUNITY)
Admission: EM | Admit: 2012-10-07 | Discharge: 2012-10-08 | DRG: 191 | Disposition: A | Discharge status: Home / Self Care | Payer: Commercial Managed Care - PPO | Attending: Internal Medicine | Admitting: Internal Medicine

## 2012-10-07 DIAGNOSIS — Z720 Tobacco use: Secondary | ICD-10-CM

## 2012-10-07 DIAGNOSIS — F17200 Nicotine dependence, unspecified, uncomplicated: Secondary | ICD-10-CM | POA: Diagnosis present

## 2012-10-07 DIAGNOSIS — IMO0002 Reserved for concepts with insufficient information to code with codable children: Secondary | ICD-10-CM

## 2012-10-07 DIAGNOSIS — I10 Essential (primary) hypertension: Secondary | ICD-10-CM | POA: Diagnosis present

## 2012-10-07 DIAGNOSIS — J45901 Unspecified asthma with (acute) exacerbation: Secondary | ICD-10-CM | POA: Diagnosis present

## 2012-10-07 DIAGNOSIS — F3289 Other specified depressive episodes: Secondary | ICD-10-CM | POA: Diagnosis present

## 2012-10-07 DIAGNOSIS — J4542 Moderate persistent asthma with status asthmaticus: Secondary | ICD-10-CM

## 2012-10-07 DIAGNOSIS — Z91199 Patient's noncompliance with other medical treatment and regimen due to unspecified reason: Secondary | ICD-10-CM

## 2012-10-07 DIAGNOSIS — F339 Major depressive disorder, recurrent, unspecified: Secondary | ICD-10-CM | POA: Diagnosis present

## 2012-10-07 DIAGNOSIS — F329 Major depressive disorder, single episode, unspecified: Secondary | ICD-10-CM | POA: Diagnosis present

## 2012-10-07 DIAGNOSIS — G4700 Insomnia, unspecified: Secondary | ICD-10-CM | POA: Diagnosis present

## 2012-10-07 DIAGNOSIS — F5101 Primary insomnia: Secondary | ICD-10-CM | POA: Diagnosis present

## 2012-10-07 DIAGNOSIS — K219 Gastro-esophageal reflux disease without esophagitis: Secondary | ICD-10-CM | POA: Diagnosis present

## 2012-10-07 DIAGNOSIS — Z9119 Patient's noncompliance with other medical treatment and regimen: Secondary | ICD-10-CM

## 2012-10-07 DIAGNOSIS — Z6841 Body Mass Index (BMI) 40.0 and over, adult: Secondary | ICD-10-CM

## 2012-10-07 DIAGNOSIS — J441 Chronic obstructive pulmonary disease with (acute) exacerbation: Principal | ICD-10-CM | POA: Diagnosis present

## 2012-10-07 DIAGNOSIS — J45902 Unspecified asthma with status asthmaticus: Secondary | ICD-10-CM

## 2012-10-07 LAB — BASIC METABOLIC PANEL
BUN: 8 mg/dL (ref 6–23)
CO2: 23 mEq/L (ref 19–32)
Calcium: 8.9 mg/dL (ref 8.4–10.5)
GFR calc non Af Amer: >90 mL/min (ref >90)
Glucose, Bld: 196 mg/dL — ABNORMAL HIGH (ref 70–99)
Potassium: 3.8 mEq/L (ref 3.5–5.1)
Sodium: 136 mEq/L (ref 135–145)

## 2012-10-07 LAB — CBC WITH DIFFERENTIAL/PLATELET
Basophils Relative: 0 % (ref 0–1)
Eosinophils Absolute: 0 10*3/uL (ref 0.0–0.7)
Eosinophils Relative: 0 % (ref 0–5)
Hemoglobin: 12.5 g/dL (ref 12.0–15.0)
Lymphs Abs: 1.3 10*3/uL (ref 0.7–4.0)
MCH: 28.5 pg (ref 26.0–34.0)
MCHC: 33.7 g/dL (ref 30.0–36.0)
MCV: 84.7 fL (ref 78.0–100.0)
Monocytes Absolute: 0.2 10*3/uL (ref 0.1–1.0)
Monocytes Relative: 1 % — ABNORMAL LOW (ref 3–12)
RBC: 4.38 MIL/uL (ref 3.87–5.11)

## 2012-10-07 MED ORDER — IPRATROPIUM BROMIDE 0.02 % IN SOLN
0.5000 mg | RESPIRATORY_TRACT | Status: DC
  Administered 2012-10-07 – 2012-10-08 (×4): 0.5 mg via RESPIRATORY_TRACT
  Filled 2012-10-07 (×4): qty 2.5

## 2012-10-07 MED ORDER — PREDNISONE 50 MG PO TABS
50.0000 mg | ORAL_TABLET | Freq: Every day | ORAL | Status: DC
  Administered 2012-10-08: 50 mg via ORAL
  Filled 2012-10-07 (×2): qty 1

## 2012-10-07 MED ORDER — SODIUM CHLORIDE 0.9 % IJ SOLN: 3.0000 mL | Freq: Two times a day (BID) | INTRAMUSCULAR | Status: DC

## 2012-10-07 MED ORDER — ALBUTEROL SULFATE (5 MG/ML) 0.5% IN NEBU
INHALATION_SOLUTION | RESPIRATORY_TRACT | Status: AC
Start: 2012-10-07 — End: 2012-10-08
  Filled 2012-10-07: qty 0.5

## 2012-10-07 MED ORDER — LORAZEPAM 1 MG PO TABS
1.0000 mg | ORAL_TABLET | Freq: Once | ORAL | Status: AC
  Administered 2012-10-07: 1 mg via ORAL
  Filled 2012-10-07: qty 1

## 2012-10-07 MED ORDER — ALBUTEROL SULFATE (5 MG/ML) 0.5% IN NEBU
2.5000 mg | INHALATION_SOLUTION | RESPIRATORY_TRACT | Status: DC
  Administered 2012-10-07 – 2012-10-08 (×4): 2.5 mg via RESPIRATORY_TRACT
  Filled 2012-10-07 (×4): qty 0.5

## 2012-10-07 MED ORDER — HEPARIN SODIUM (PORCINE) 5000 UNIT/ML IJ SOLN
5000.0000 [IU] | Freq: Three times a day (TID) | INTRAMUSCULAR | Status: DC
  Administered 2012-10-07 – 2012-10-08 (×2): 5000 [IU] via SUBCUTANEOUS
  Filled 2012-10-07 (×5): qty 1

## 2012-10-07 MED ORDER — CLONAZEPAM 1 MG PO TABS
1.0000 mg | ORAL_TABLET | Freq: Two times a day (BID) | ORAL | Status: DC | PRN
  Filled 2012-10-07: qty 1

## 2012-10-07 MED ORDER — GUAIFENESIN 100 MG/5ML PO SYRP
200.0000 mg | ORAL_SOLUTION | ORAL | Status: DC | PRN
  Filled 2012-10-07: qty 10

## 2012-10-07 MED ORDER — ALBUTEROL SULFATE (5 MG/ML) 0.5% IN NEBU
5.0000 mg | INHALATION_SOLUTION | Freq: Once | RESPIRATORY_TRACT | Status: AC
  Administered 2012-10-07: 5 mg via RESPIRATORY_TRACT
  Filled 2012-10-07: qty 1

## 2012-10-07 MED ORDER — SODIUM CHLORIDE 0.9 % IV SOLN
INTRAVENOUS | Status: DC
  Administered 2012-10-07: 21:00:00 via INTRAVENOUS

## 2012-10-07 MED ORDER — OMEPRAZOLE MAGNESIUM 20 MG PO TBEC: 20.0000 mg | DELAYED_RELEASE_TABLET | Freq: Two times a day (BID) | ORAL | Status: DC

## 2012-10-07 MED ORDER — SODIUM CHLORIDE 0.9 % IJ SOLN: 3.0000 mL | INTRAMUSCULAR | Status: DC | PRN

## 2012-10-07 MED ORDER — IPRATROPIUM BROMIDE 0.02 % IN SOLN
0.5000 mg | Freq: Once | RESPIRATORY_TRACT | Status: AC
  Administered 2012-10-07: 0.5 mg via RESPIRATORY_TRACT
  Filled 2012-10-07: qty 2.5

## 2012-10-07 MED ORDER — OMEPRAZOLE 20 MG PO CPDR
20.0000 mg | DELAYED_RELEASE_CAPSULE | Freq: Two times a day (BID) | ORAL | Status: DC
  Administered 2012-10-07 – 2012-10-08 (×2): 20 mg via ORAL
  Filled 2012-10-07 (×4): qty 1

## 2012-10-07 MED ORDER — SODIUM CHLORIDE 0.9 % IV SOLN: 250.0000 mL | INTRAVENOUS | Status: DC | PRN

## 2012-10-07 MED ORDER — MAGNESIUM SULFATE 40 MG/ML IJ SOLN
2.0000 g | Freq: Once | INTRAMUSCULAR | Status: AC
  Administered 2012-10-07: 2 g via INTRAVENOUS
  Filled 2012-10-07: qty 50

## 2012-10-07 MED ORDER — IPRATROPIUM BROMIDE 0.02 % IN SOLN
0.5000 mg | Freq: Once | RESPIRATORY_TRACT | Status: AC
  Administered 2012-10-07: 0.5 mg via RESPIRATORY_TRACT

## 2012-10-07 MED ORDER — METHYLPREDNISOLONE SODIUM SUCC 125 MG IJ SOLR
125.0000 mg | Freq: Once | INTRAMUSCULAR | Status: AC
  Administered 2012-10-07: 125 mg via INTRAVENOUS
  Filled 2012-10-07: qty 2

## 2012-10-07 MED ORDER — ALBUTEROL SULFATE (5 MG/ML) 0.5% IN NEBU
5.0000 mg | INHALATION_SOLUTION | Freq: Once | RESPIRATORY_TRACT | Status: AC
  Administered 2012-10-07: 5 mg via RESPIRATORY_TRACT

## 2012-10-07 MED ORDER — NICOTINE 21 MG/24HR TD PT24
21.0000 mg | MEDICATED_PATCH | Freq: Every day | TRANSDERMAL | Status: DC
  Administered 2012-10-07 – 2012-10-08 (×2): 21 mg via TRANSDERMAL
  Filled 2012-10-07 (×2): qty 1

## 2012-10-07 MED ORDER — ACETAMINOPHEN 325 MG PO TABS
650.0000 mg | ORAL_TABLET | Freq: Once | ORAL | Status: AC
  Administered 2012-10-07: 650 mg via ORAL
  Filled 2012-10-07: qty 2

## 2012-10-07 NOTE — Discharge Summary (Signed)
Name: April [name] MRN: [id number] DOB: 1970-07-13 42 y.o. PCP: Mathis Dad, MD  Date of Admission: 10/06/2012  5:28 PM Date of Discharge: 10/07/2012 Attending Physician: Farley Ly, MD   PATIENT LEFT AMA   Discharge Diagnosis: Principal Problem:   Asthma exacerbation Active Problems:   Morbid obesity with body mass index of 50.0-59.9 in adult   TOBACCO USER   HYPERTENSION, ESSENTIAL NOS   Esophageal reflux   Sleep apnea   T wave inversion in EKG   Disposition and follow-up:   [name] left against medical advice on 10/07/2012, 0100hours.   The ramifications for her decision to leave without receiving complete treatment for acute asthma including worsening of symptoms, possible intubations for respiratory failure and potential risk of death were discussed at length (spent 20 minutes with patient and her family). She verbalized understanding of these risks but insisted that she needed to go home. Her capacity to make these decision was not in doubt. She was concerned that she will get fired at her job if she does not show up early in the morning. We offered to write a letter to her employer indicating that she is hospitalized but she declined. She indicated that she will see her pulmonologist the next morning who she hoped will be able to help.   We advised her to call 911 or present to the emergence department if she develops severe shortness of breath or chest pain or dizziness given that her EKG which had revealed T wave inversions in multiple leads. We had planned to exclude cardia ischemia with troponin and repeat EKG. No medications were prescribed at the time she left.   Follow-up Appointments: None.   Future appointments       Future Appointments Provider Department Dept Phone   04/05/2013 2:00 PM [name], MD [facility] [phone]      Consultations:   None  Procedures Performed:  Dg Chest 2 View (if Patient Has Fever And/or  Copd)  10/06/2012   *RADIOLOGY REPORT*  Clinical Data: Chest tightness, wheezing, shortness of breath, cough and history of asthma.  CHEST - 2 VIEW  Comparison: 05/22/2012  Findings: Stable mild chronic bronchial thickening without focal infiltrate.  Lung volumes are normal.  No pneumothorax, edema or pleural fluid is identified.  The heart size and mediastinal contours are within normal limits.  No bony abnormalities are seen.  IMPRESSION: Chronic bronchial thickening without focal infiltrate.   Original Report Authenticated By: Irish Lack, M.D.     Admission HPI: 42 year old woman with pmhx of morbid obesity, COPD, HTN, tobacco abuse, depression and GERD, asthma with frequent exacerbations every 2-3 weeks (followed by Dr Maple Hudson of pulmonology, no prev intubations), presents with acute onset of SOB which happened on the day of admission. It was associated with wheezing and increased coughing. She tried her usual inhalers but this did not help her SOB and therefore presented to the ED. She usually uses her albuterol Q1hr however, over the last one week she is been using it 3-4x/hour and had also increased her Dulera use from 2 to 4 times day. Her albuterol canister last her one week. She reports recurrent cough and one episode of yellow sputum production which happened today, chest pain with coughing. She also reports dizziness and ' occasionally passing out for a few seconds when I cough too much''. She reports subjective fevers without chills. She reports feeling increasing fatigued over the past 2 days.  Since  Jan 2011 she has intermittently been on Xolair injection Q2 weeks by Dr Maple Hudson. Has been having challenges affording this medication due to insurance coverage. Her last injection was 6 weeks ago. She has noticed that her SOB has worsened since being off Xollair. Most recently hospitalized 05/22/12-05/23/12 for asthma excerbation d/t viral URI. 2 weeks ago she was evaluated in the ED for acute asthma  exacerbation.  Peak expiratory flow rate of 200>>360 after she had received continuous albuterol nebulizing in the ED. She reports a baseline PEFR to be 400 -600.   Review of Systems:  Constitutional: Denies diaphoresis. No appetite change.  HEENT: Denies photophobia, eye pain, redness, hearing loss, ear pain. + Nasal congestion and itching in her throat. No sore throat, rhinorrhea, sneezing, mouth sores, trouble swallowing, neck pain, neck stiffness and tinnitus.  Cardiovascular: Denies palpitations and leg swelling.  Gastrointestinal: Denies nausea or abdominal pain. +alternating diarrhea and constipation, dark brown stools and occasional blood in stool without flank bleeding, she also reports heart burn. No and abdominal distention.  Genitourinary: Denies dysuria, urgency, frequency, hematuria, flank pain and difficulty urinating.  Musculoskeletal: Reports myalgias, back pain, joint swelling, arthralgias - but these are chronic. No gait problem.  Skin: Denies pallor, rash and wound.  Neurological: Denies seizures, weakness, lightheadedness, numbness and headaches.  Hematological: Denies adenopathy. Easy bruising, personal or family bleeding history  Psychiatric/Behavioral: Denies suicidal ideation, mood changes, confusion, nervousness, sleep disturbance and agitation  Physical Exam:  BP 158/106, PR106, Temp 98.2 F (36.8 C), RR 25, LNMP 10/04/2012, SpO2 96.00% on RA.  General: Morbidly Obese, in moderate acute distressed, cooperative with exam  Head: atraumatic, normocephalic,  Eye: pupils equal, round and reactive; sclera anicteric; normal conjunctiva  Nose/throat: oropharynx clear, moist mucous membranes, pink gums  Neck: supple, no carotid bruits, thyroid normal in size and without palpable nodules  Lymph nodes: no cervical or supraclavicular lymphadenopathy  Lungs/Chest wall: Dyspneic, bilateral widespread wheezes with expiration. No use of accessory muscles of respiration.  Heart:  normal rate and regular rhythm; no murmurs  Pulses: radial and dorsalis pedis pulses are 2+ and symmetric  Abdomen: Normal fullness, no rebound, guarding, or rigidity; normal bowel sounds; no masses or organomegaly  Skin: warm, dry, intact, normal turgor, no rashes  Extremities: no peripheral edema, clubbing, or cyanosis  Neurologic: A&O X3, CN II - XII are grossly intact. Motor strength is 5/5 in the all 4 extremities, Sensations intact to light touch, Cerebellar signs negative.  Psych: She reports poor sleep with snoring, increased day time somnolence. Her sleep study in 2008 indicated mild sleep apnea that did not require CPAP. She is alert and oriented, mood and affect are normal and congruent, thought content is normal without delusions, thought process is linear, speech is normal and non-pressured, behavior is normal    Hospital Course by problem list: Principal Problem:   Asthma exacerbation Active Problems:   Morbid obesity with body mass index of 50.0-59.9 in adult   TOBACCO USER   HYPERTENSION, ESSENTIAL NOS   Esophageal reflux   Sleep apnea   T wave inversion in EKG   1. Acute moderate Asthma Exacerbation: History of poorly controlled asthma and mild COPD from PFT in 2002, and medication noncompliance due to cost. Continued smoking is contributing to her symptoms and poor control of asthma. COPD exacerbation and URI were less likely in her case. Her lowest measured PEFR was 200 (50% of her Baseline). This improved to 360 after initial treatment in the ED. Home meds include  albuterol inhaler and neb, Advair (sometimes Dulera or Symbicort), claritin and Xollair (intermittently since 2011). She is followed by [facility] pulmnology. Her Sat O2 100%, and wheezing was noticed on chest exam. CXR -Chronic bronchial thickening without focal infiltrate. In the ED she had received Magnesium IV, 1L NS bolus, and albuterol/atrovent nebs. She left AMA at 1 am 10/07/2012 before completion standard  treatment and observation for acute asthma exacerbation. She was informed that her symptoms are likely to remain uncontrolled and that she had a risk for developing respiratory failure. She verbalized understanding of the ramifications of her decision to leave AMA. No discharge medication were prescribed. We advised her to use her inhaler of albuterol or use her user nebulizer treatment and if she continues to feel unwell to return to the emergency department. At the time she left the hospital she was still in respiratory distress but saturating at 98% on room air.   2. TWI in inferolateral Leads: No STE or STD. No chest pain. Risk factors - HTN, tobacco, morbidly obese, LDL 09/2009 123, HDL 47. Likely cardiac demand ischemia in setting of moderate asthma exacerbation. Two sets of troponin were negative. She was asymptomatic at the time of he departure AMA.  Discharge Vitals:   BP 129/83  Pulse 100  Temp(Src) 98.7 F (37.1 C) (Oral)  Resp 22  Ht 5\' 6"  (1.676 m)  Wt 354 lb 8 oz (160.8 kg)  BMI 57.25 kg/m2  SpO2 97%  LMP 10/04/2012  Discharge Labs:  Results for orders placed during the hospital encounter of 10/06/12 (from the past 24 hour(s))  CBC     Status: Abnormal   Collection Time    10/06/12  6:02 PM      Result Value Range   WBC 15.9 (*) 4.0 - 10.5 K/uL   RBC 4.43  3.87 - 5.11 MIL/uL   Hemoglobin 12.5  12.0 - 15.0 g/dL   HCT 16.1  09.6 - 04.5 %   MCV 84.4  78.0 - 100.0 fL   MCH 28.2  26.0 - 34.0 pg   MCHC 33.4  30.0 - 36.0 g/dL   RDW 40.9 (*) 81.1 - 91.4 %   Platelets 375  150 - 400 K/uL  BASIC METABOLIC PANEL     Status: None   Collection Time    10/06/12  6:02 PM      Result Value Range   Sodium 141  135 - 145 mEq/L   Potassium 3.6  3.5 - 5.1 mEq/L   Chloride 104  96 - 112 mEq/L   CO2 29  19 - 32 mEq/L   Glucose, Bld 91  70 - 99 mg/dL   BUN 7  6 - 23 mg/dL   Creatinine, Ser 7.82  0.50 - 1.10 mg/dL   Calcium 8.8  8.4 - 95.6 mg/dL   GFR calc non Af Amer >90  >90 mL/min     GFR calc Af Amer >90  >90 mL/min  HEPATIC FUNCTION PANEL     Status: Abnormal   Collection Time    10/06/12  6:02 PM      Result Value Range   Total Protein 6.8  6.0 - 8.3 g/dL   Albumin 3.1 (*) 3.5 - 5.2 g/dL   AST 18  0 - 37 U/L   ALT 14  0 - 35 U/L   Alkaline Phosphatase 76  39 - 117 U/L   Total Bilirubin 0.2 (*) 0.3 - 1.2 mg/dL   Bilirubin, Direct <2.1  0.0 -  0.3 mg/dL   Indirect Bilirubin NOT CALCULATED  0.3 - 0.9 mg/dL  DIFFERENTIAL     Status: Abnormal   Collection Time    10/06/12  6:02 PM      Result Value Range   Neutrophils Relative % 65  43 - 77 %   Neutro Abs 9.8 (*) 1.7 - 7.7 K/uL   Lymphocytes Relative 25  12 - 46 %   Lymphs Abs 3.8  0.7 - 4.0 K/uL   Monocytes Relative 7  3 - 12 %   Monocytes Absolute 1.0  0.1 - 1.0 K/uL   Eosinophils Relative 3  0 - 5 %   Eosinophils Absolute 0.5  0.0 - 0.7 K/uL   Basophils Relative 0  0 - 1 %   Basophils Absolute 0.0  0.0 - 0.1 K/uL  POCT I-STAT TROPONIN I     Status: None   Collection Time    10/06/12  6:21 PM      Result Value Range   Troponin i, poc 0.00  0.00 - 0.08 ng/mL   Comment 3           TROPONIN I     Status: None   Collection Time    10/07/12 12:18 AM      Result Value Range   Troponin I <0.30  <0.30 ng/mL   Signed:  Dow Adolph, MD PGY-1 Internal Medicine Teaching Service 10/07/2012, 4:29 AM    Time Spent on Discharge: 20 minutes Services Ordered on Discharge: None Equipment Ordered on Discharge: None

## 2012-10-07 NOTE — ED Provider Notes (Signed)
  Medical screening examination/treatment/procedure(s) were performed by non-physician practitioner and as supervising physician I was immediately available for consultation/collaboration.  I saw the ECG, relevant labs and studies - I agree with the interpretation.   Gerhard Munch, MD 10/07/12 [phone]

## 2012-10-07 NOTE — H&P (Signed)
Hospital Admission Note Date: 10/07/2012  Patient name: April [name] Medical record number: [id number] Date of birth: 26-Nov-1970 Age: 42 y.o. Gender: female PCP: Carrolyn Meiers, MD  Medical Service: IMTS  Attending physician:  Dr. Alwyn Ren infomation:  Weekday Hours (7AM-5PM):  1st Contact: Dr. Christen Bame Pager: [phone] 2nd Contact: Dr. Lorretta Harp Pager: [phone]. Will be off on 10/08/12. Please call [phone] if needed on 10/08/12.  ** If no return call within 15 minutes (after trying both pagers listed below), please call after hours pagers.  After 5 pm or weekends: 1st Contact: Pager: [phone] 2nd Contact: Pager: [phone]  Chief Complaint: SOB and wheezing.  History of Present Illness:  42 year old woman with pmhx of morbid obesity, COPD,  HTN, tobacco abuse, depression and GERD, asthma, who presents with SOB and wheezing.  Patient came to ED yesterday and was evaluated by our team. She was admitted for asthma exacerbation on 6/10, but she left against AMA because that she had to go back to work. Since her symptoms have not improved, she comes back to ED today.   She reports that she has been taking her medications regularly. But she has frequent asthma exacerbation with frequent exacerbations every 2-3 weeks (followed by Dr Maple Hudson of pulmonology, no prev intubations). In past 3 days, her shortness of breath has been getting worse. It was associated with wheezing and increased coughing. She tried her usual inhalers but this did not help her SOB and therefore presented to the ED. She usually uses her albuterol Q1hr however, over the last one week she is been using it 3-4x/hour and had also increased her Dulera use from 2 to 4 times day. Her albuterol canister last her one week. She reports recurrent cough and several episodes of yellow sputum production which happened since yesteray, chest pain with coughing. She denies external chest pain. She also reports dizziness and "occasionally passing  out for a few seconds when I cough too much''. She reports subjective fevers without chills. She reports feeling increasingly fatigued over the past 3 days.   Since Jan 2011 she has intermittently been on Xolair injection Q2 weeks by Dr Maple Hudson. Has been having challenges affording this medication due to insurance coverage. Her last injection was 6 weeks ago. She has noticed that her SOB has worsened since being off Xollair. Most recently hospitalized 05/22/12-05/23/12 for asthma excerbation d/t viral URI. 2 weeks ago she was evaluated in the ED for acute asthma exacerbation.  When I saw patient in ED, patient feels better after treated with nebulizers and 2 g of magnesium sulfate in ED. Her oxygen saturation is 100% at room air.  Meds: Current Outpatient Rx  Name  Route  Sig  Dispense  Refill  . albuterol (PROVENTIL HFA;VENTOLIN HFA) 108 (90 BASE) MCG/ACT inhaler   Inhalation   Inhale 2 puffs into the lungs every 4 (four) hours as needed for wheezing or shortness of breath.         [name] albuterol (PROVENTIL) (2.5 MG/3ML) 0.083% nebulizer solution   Nebulization   Take 2.5 mg by nebulization every 4 (four) hours as needed for wheezing or shortness of breath.          . clonazePAM (KLONOPIN) 0.5 MG tablet   Oral   Take 0.5-1 mg by mouth at bedtime as needed (sleep).         . EPINEPHrine (EPIPEN) 0.3 mg/0.3 mL DEVI   Intramuscular   Inject 0.3 mg into the muscle daily as needed (  anaphylaxis).          . Fluticasone-Salmeterol (ADVAIR DISKUS) 250-50 MCG/DOSE AEPB   Inhalation   Inhale 1 puff into the lungs 2 (two) times daily. Rinse mouth   60 each   prn   . omalizumab (XOLAIR) 150 MG injection   Subcutaneous   Inject 225 mg into the skin every 14 (fourteen) days.         [name] omeprazole (PRILOSEC OTC) 20 MG tablet   Oral   Take 20-40 mg by mouth 2 (two) times daily.           Allergies: Allergies as of 10/07/2012 - Review Complete 10/07/2012  Allergen Reaction Noted  .  Ace inhibitors  11/24/2009  . Percocet (oxycodone-acetaminophen) Itching 01/13/2012  . Zolpidem tartrate  05/28/2011   Past Medical History  Diagnosis Date  . Acanthosis nigricans   . Menorrhagia   . Hypertension, essential   . Insomnia   . Morbid obesity   . Helicobacter pylori (H. pylori) infection   . Sleep apnea     Sleep study 2008 - mild OSA, not enough events to titrate CPAP  . COPD (chronic obstructive pulmonary disease)     PFTs in 2002, FEV1/FVC 65, no post bronchodilater test done  . Asthma     Followed by Dr. Maple Hudson (pulmonology); receives every other week omalizumab injections; has frequent exacerbations  . Depression   . GERD (gastroesophageal reflux disease)   . Tobacco user    Past Surgical History  Procedure Laterality Date  . Tubal ligation  1996    bilateral  . Breast reduction surgery  09/2011   Family History  Problem Relation Age of Onset  . Asthma Daughter   . Cancer Paternal Aunt   . Asthma Maternal Grandmother   . Hypertension Mother    History   Social History  . Marital Status: Divorced    Spouse Name: N/A    Number of Children: 1  . 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 Smoking status: Former Smoker    Types: Cigarettes    Quit date: 06/26/2007  . Smokeless tobacco: Never Used  . Alcohol Use: No  . Drug Use: No  . Sexually Active: Not on file   Other Topics Concern  . 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     Review of Systems:   Constitutional: Denies diaphoresis. No appetite change. HEENT: Denies photophobia, eye pain, redness, hearing loss, ear pain. + Nasal congestion and itching in her throat. No sore throat, rhinorrhea, sneezing, mouth sores, trouble swallowing, neck pain, neck stiffness and tinnitus.   Cardiovascular:  Denies palpitations and leg swelling.  Gastrointestinal: Denies nausea or abdominal pain. +alternating diarrhea and constipation, dark brown stools and occasional blood in stool without flank bleeding, she also reports heart burn. No and abdominal distention.  Genitourinary: Denies dysuria, urgency, frequency, hematuria, flank pain and difficulty urinating.   Musculoskeletal: Reports myalgias, back pain, joint swelling, arthralgias - but these are chronic. No gait problem.   Skin: Denies pallor, rash and wound.   Neurological: Denies seizures, weakness, lightheadedness, numbness and headaches.   Hematological: Denies adenopathy. Easy bruising, personal or family bleeding history   Psychiatric/Behavioral: Denies suicidal ideation, mood changes, confusion, nervousness, sleep disturbance and agitation  Physical Exam:    Filed Vitals:   10/07/12 1830 10/07/12 1845 10/07/12 1900 10/07/12 1915  BP: 151/99 162/96 171/93 162/100  Pulse: 101 98 96 99  Temp:      TempSrc:      Resp: 31 30 29 29   SpO2: 100% 98% 99% 98%   General: Morbidly Obese, in moderate acute distressed, cooperative with exam Head: atraumatic, normocephalic,   Eye: pupils equal, round and reactive; sclera anicteric; normal conjunctiva  Nose/throat: oropharynx clear, moist mucous membranes, pink gums  Neck: supple, no carotid bruits, thyroid normal in size and without palpable nodules   Lymph nodes: no cervical or supraclavicular lymphadenopathy   Lungs/Chest wall: Decreased air movement bilaterally. Bilateral widespread wheezes with expiration. No use of accessory muscles of respiration.   Heart: normal rate and regular rhythm; no murmurs Pulses: radial and dorsalis pedis pulses are 2+ and symmetric   Abdomen: Normal fullness, no rebound, guarding, or rigidity; normal bowel sounds; no masses or organomegaly   Skin: warm, dry, intact, normal turgor, no rashes   Extremities: no peripheral edema, clubbing, or  cyanosis Neurologic: A&O X3, CN II - XII are grossly intact. Motor strength is 5/5 in the all 4 extremities, Sensations intact to light touch, Cerebellar signs negative.  Psych: She reports poor sleep with snoring, increased day time somnolence. Her sleep study in 2008 indicated mild sleep apnea that did not require CPAP. She is alert and oriented, mood and affect are normal and congruent, thought content is normal without delusions, thought process is linear, speech is normal and non-pressured, behavior is normal   Lab results: Basic Metabolic Panel:  Recent Labs  65/78/46 1802  NA 141  K 3.6  CL 104  CO2 29  GLUCOSE 91  BUN 7  CREATININE 0.81  CALCIUM 8.8   Liver Function Tests:  Recent Labs  10/06/12 1802  AST 18  ALT 14  ALKPHOS 76  BILITOT 0.2*  PROT 6.8  ALBUMIN 3.1*   No results found for this basename: LIPASE, AMYLASE,  in the last 72 hours No results found for this basename: AMMONIA,  in the last 72 hours CBC:  Recent Labs  10/06/12 1802  WBC 15.9*  NEUTROABS 9.8*  HGB 12.5  HCT 37.4  MCV 84.4  PLT 375   Cardiac Enzymes:  Recent Labs  10/07/12 0018  TROPONINI <0.30   BNP: No results found for this basename: PROBNP,  in the last 72 hours D-Dimer: No results found for this basename: DDIMER,  in the last 72 hours CBG: No results found for this basename: GLUCAP,  in the last 72 hours Hemoglobin A1C: No results found for this basename: HGBA1C,  in the last 72 hours Fasting Lipid Panel: No results found for this basename: CHOL, HDL, LDLCALC, TRIG, CHOLHDL, LDLDIRECT,  in the last 72 hours Thyroid Function Tests: No results found for this basename: TSH, T4TOTAL, FREET4, T3FREE, THYROIDAB,  in the last 72 hours Anemia Panel: No results found for this basename: VITAMINB12, FOLATE, FERRITIN, TIBC, IRON, RETICCTPCT,  in the last 72 hours Coagulation: No results found for this basename: LABPROT, INR,  in the last 72 hours Urine Drug Screen: Drugs of  Abuse     Component Value Date/Time   LABOPIA NONE DETECTED 03/04/2010 1242   COCAINSCRNUR NONE DETECTED 03/04/2010 1242   LABBENZ NONE DETECTED 03/04/2010 1242   AMPHETMU NONE DETECTED 03/04/2010 1242   THCU NONE DETECTED 03/04/2010 1242   LABBARB  Value: NONE DETECTED  DRUG SCREEN FOR MEDICAL PURPOSES ONLY.  IF CONFIRMATION IS NEEDED FOR ANY PURPOSE, NOTIFY LAB WITHIN 5 DAYS.        LOWEST DETECTABLE LIMITS FOR URINE DRUG SCREEN Drug Class       Cutoff (ng/mL) Amphetamine      1000 Barbiturate      200 Benzodiazepine   200 Tricyclics       300 Opiates          300 Cocaine          300 THC              50 03/04/2010 1242    Alcohol Level: No results found for this basename: ETH,  in the last 72 hours Urinalysis: No results found for this basename: COLORURINE, APPERANCEUR, LABSPEC, PHURINE, GLUCOSEU, HGBUR, BILIRUBINUR, KETONESUR, PROTEINUR, UROBILINOGEN, NITRITE, LEUKOCYTESUR,  in the last 72 hours Misc. Labs: Imaging results:  Dg Chest 2 View  10/07/2012   *RADIOLOGY REPORT*  Clinical Data: Chest pain and shortness of breath.  CHEST - 2 VIEW  Comparison: 10/06/2012.  Findings: Trachea is midline.  Heart size normal.  Lungs are clear. No pleural fluid.  IMPRESSION: No acute findings.   Original Report Authenticated By: Leanna Battles, M.D.   Dg Chest 2 View (if Patient Has Fever And/or Copd)  10/06/2012   *RADIOLOGY REPORT*  Clinical Data: Chest tightness, wheezing, shortness of breath, cough and history of asthma.  CHEST - 2 VIEW  Comparison: 05/22/2012  Findings: Stable mild chronic bronchial thickening without focal infiltrate.  Lung volumes are normal.  No pneumothorax, edema or pleural fluid is identified.  The heart size and mediastinal contours are within normal limits.  No bony abnormalities are seen.  IMPRESSION: Chronic bronchial thickening without focal infiltrate.   Original Report Authenticated By: Irish Lack, M.D.    Other results:  EKG: Sinus rhythm, regular, normal axis,  normal R wave progression, normal QT interval, TWI in inferior leads and V4 to V6.   Assessment & Plan by Problem: Active Problems:   DEPRESSION   HYPERTENSION, ESSENTIAL NOS   Esophageal reflux   Insomnia   Asthma exacerbation   Tobacco abuse  42 year old woman with pmhx of morbid obesity, COPD,  HTN, tobacco abuse, depression and GERD, asthma with frequent exacerbations every 2-3 weeks (followed by Dr Maple Hudson of pulmonology, no prev intubations), presents with worsening of SOB.  #Asthma Exacerbation: History of poorly controlled asthma and mild COPD from PFT in 2002, and medication noncompliance due to cost. Continued smoking is contributing to her symptoms and poor control of asthma. COPD exacerbation and URI are less likely. Home meds include albuterol inhaler and neb, Advair (sometimes Dulera or Symbicort), claritin and Xollair (intermittently since 2011). Followed by [facility] pulm. Sat O2 100%, and wheezing. CXR -without focal infiltrate. In ED she feels better after received Magnesium IV and nebs ED.  Plan   - Admit to telemetry    - PO prednisone 50mg  daily   - Scheduled DuoNebs - q4hours - Oxygen therapy to keep SO2 above 92% - NS 100cc/hr for 10 for insensible losses. - Monitor peak flow measurements to guide treatment  - Xollair to be resumed as outpatient  #TWI in inferolateral Leads: No exertional chest pain. Likely cardiac demand ischemia in setting of moderate asthma exacerbation. Risk factors - HTN, tobacco, morbidly obese, LDL 09/2009 123, HDL 47. She had negative trop on 6/10. Today, trop negative X 1.  Plan   -will get another Trop - Monitor  for chest pain - Monitor on telemetry   - Repeat AM EKG   # HTN: BP currently stable. On HCTZ at home. Will hold hydrochlorothiazide. Consider restarting tomorrow if Bp up.  # GERD: Prilosec 20 mg bid. Stable.  # Depression: stable. Not on medications at home.  #Tobacco Abuse: Contributing to asthma symptoms. Plan   Nicotine  patch.   SW consult   #  F/E/N  -NS: IV 100 cc/hr -Monitor electrolytes by checking BMP -Heart health diet  # DVT px: Heparin sq    Dispo: Disposition is deferred at this time, awaiting improvement of current medical problems. Anticipated discharge in approximately 1-2 day(s).   The patient does have a current PCP Mathis Dad, MD), therefore will be require [facility]PC follow-up after discharge.   The patient does not have transportation limitations that hinder transportation to clinic appointments.   Signed:   Lorretta Harp, MD PGY2, Internal Medicine Teaching Service Pager: [phone]  10/07/2012, 8:04 PM

## 2012-10-07 NOTE — Telephone Encounter (Signed)
Per Eather Colas, pt called back and wanted to speak with a nurse ASAP.  Called, spoke with pt.  She is currently in [facility] ER.  Pt states she feels worse than she did yesterday and feels "like I'm about to die."  Pt states they have given her a breathing tx with no relief and did a cxr but they "put me back in the lobby."  She was upset that she was "in the lobby" and felt "like I"m going to die."  Pt states she has advised staff that she feels this way but "they aren't doing anything."  Pt was talking about leaving because they "aren't doing anything for me" but scare she "would die" if she drove to [facility] ER.  I explained to pt that she should stay there and once again let a worker know how she is currently feeling.  At that time, a gentleman walked pt.  Pt did let him know.  Shortly afterwards an EMT, Heather, came to assess pt.  I did speak with Herbert Seta and verified this.  I then advised pt that Herbert Seta was there to assess her further. Herbert Seta was trying to talk to pt in the background.  Advised pt I was going to let her go now that Herbert Seta was there.   She should express her concerns and current feelings with Heather.  Advised she needs to stay at the [facility] ER and should follow their recommendations.  She verbalized understanding.  Will route to CDY as FYI.

## 2012-10-07 NOTE — ED Notes (Signed)
Pt presents to department for evaluation of SOB and chest pain. Pt was admitted to hospital last night, but left AMA. She is back today with worsening symptoms. Audible wheezing upon arrival to exam room. States 10/10 diffuse chest pain. SOB becomes worse on exertion. She is speaking complete sentences. Pt is conscious alert and oriented x4.

## 2012-10-07 NOTE — Telephone Encounter (Signed)
According to her chart, the pt has been admitted as of today Will forward this to CDY to make him aware

## 2012-10-07 NOTE — Discharge Summary (Signed)
Internal Medicine Attending  Date: 10/07/2012  Patient name: April [name] Medical record number: [id number] Date of birth: 11/05/1970 Age: 41 y.o. Gender: female  I did not see the patient because she left the hospital against medical advice. 

## 2012-10-07 NOTE — ED Provider Notes (Signed)
History     CSN: [id number]  Arrival date & time 10/07/12  1240   First MD Initiated Contact with Patient 10/07/12 1631      Chief Complaint  Patient presents with  . Asthma  . Chest Pain  . Shortness of Breath    (Consider location/radiation/quality/duration/timing/severity/associated sxs/prior treatment) HPI Comments: 42 year old female with past medical history of asthma, hypertension, COPD and GERD presents to the emergency department after leaving the hospital AMA last night complaining of continued shortness of breath and wheezing. 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 She returns today stating she realizes she needs to stay in the hospital. Chest feels tight rated 10 out of 10, wheezing worsened. She has albuterol at home without relief. She was also given 2 breathing treatments upon arrival to the emergency department without relief. Denies fever, chills, nausea or vomiting.  Patient is a 42 y.o. female presenting with asthma, chest pain, and shortness of breath. The history is provided by the patient.  Asthma Associated symptoms include chest pain. Pertinent negatives include no chills, fever, headaches, nausea, vomiting or weakness.  Chest Pain Associated symptoms: shortness of breath   Associated symptoms: no dizziness, no fever, no headache, no nausea, not vomiting and no weakness   Shortness of Breath Associated symptoms: chest pain and wheezing   Associated symptoms: no fever, no headaches and no vomiting     Past Medical History  Diagnosis Date  . Acanthosis nigricans   . Menorrhagia   . Hypertension, essential   . Insomnia   . Morbid obesity   . Helicobacter pylori (H. pylori) infection   . Sleep apnea     Sleep study 2008 - mild OSA, not enough events to titrate CPAP  . COPD (chronic obstructive pulmonary disease)     PFTs in  2002, FEV1/FVC 65, no post bronchodilater test done  . Asthma     Followed by Dr. Maple Hudson (pulmonology); receives every other week omalizumab injections; has frequent exacerbations  . Depression   . GERD (gastroesophageal reflux disease)   . Tobacco user     Past Surgical History  Procedure Laterality Date  . Tubal ligation  1996    bilateral  . Breast reduction surgery  09/2011    Family History  Problem Relation Age of Onset  . Asthma Daughter   . Cancer Paternal Aunt   . Asthma Maternal Grandmother   . Hypertension Mother     History  Substance Use Topics  . Smoking status: Former Smoker    Types: Cigarettes    Quit date: 06/26/2007  . Smokeless tobacco: Never Used  . Alcohol Use: No    OB History   Grav Para Term Preterm Abortions TAB SAB Ect Mult Living                  Review of Systems  Constitutional: Negative for fever and chills.  Respiratory: Positive for shortness of breath and wheezing.   Cardiovascular: Positive for chest pain.  Gastrointestinal: Negative for nausea and vomiting.  Neurological: Negative for dizziness, weakness, light-headedness and headaches.  All other systems reviewed and are negative.    Allergies  Ace inhibitors; Percocet; and Zolpidem tartrate  Home Medications   Current Outpatient Rx  Name  Route  Sig  Dispense  Refill  . albuterol (PROVENTIL HFA;VENTOLIN HFA) 108 (90 BASE) MCG/ACT inhaler  Inhalation   Inhale 2 puffs into the lungs every 4 (four) hours as needed for wheezing or shortness of breath.         [name] albuterol (PROVENTIL) (2.5 MG/3ML) 0.083% nebulizer solution   Nebulization   Take 2.5 mg by nebulization every 4 (four) hours as needed for wheezing or shortness of breath.          . clonazePAM (KLONOPIN) 0.5 MG tablet   Oral   Take 0.5-1 mg by mouth at bedtime as needed (sleep).         . EPINEPHrine (EPIPEN) 0.3 mg/0.3 mL DEVI   Intramuscular   Inject 0.3 mg into the muscle daily as needed  (anaphylaxis).          . Fluticasone-Salmeterol (ADVAIR DISKUS) 250-50 MCG/DOSE AEPB   Inhalation   Inhale 1 puff into the lungs 2 (two) times daily. Rinse mouth   60 each   prn   . omalizumab (XOLAIR) 150 MG injection   Subcutaneous   Inject 225 mg into the skin every 14 (fourteen) days.         [name] omeprazole (PRILOSEC OTC) 20 MG tablet   Oral   Take 20-40 mg by mouth 2 (two) times daily.           BP 120/75  Pulse 102  Temp(Src) 98.3 F (36.8 C) (Oral)  Resp 24  SpO2 100%  LMP 10/04/2012  Physical Exam  Nursing note and vitals reviewed. Constitutional: She is oriented to person, place, and time. She appears well-developed. No distress.  Obese  HENT:  Head: Normocephalic and atraumatic.  Mouth/Throat: Oropharynx is clear and moist.  Eyes: Conjunctivae and EOM are normal. Pupils are equal, round, and reactive to light.  Neck: Normal range of motion. Neck supple. No JVD present. No tracheal deviation present.  Cardiovascular: Regular rhythm, normal heart sounds and intact distal pulses.  Tachycardia present.   Pulmonary/Chest: No accessory muscle usage. Tachypnea noted. No apnea. No respiratory distress. She has no decreased breath sounds. She has wheezes (inspiratory and expiratory). She has no rhonchi. She has no rales.  Abdominal: Soft. Bowel sounds are normal. There is no tenderness.  Musculoskeletal: Normal range of motion. She exhibits no edema.  Neurological: She is alert and oriented to person, place, and time.  Skin: Skin is warm and dry. She is not diaphoretic.  Psychiatric: She has a normal mood and affect. Her behavior is normal.    ED Course  Procedures (including critical care time)  Labs Reviewed - No data to display Dg Chest 2 View  10/07/2012   *RADIOLOGY REPORT*  Clinical Data: Chest pain and shortness of breath.  CHEST - 2 VIEW  Comparison: 10/06/2012.  Findings: Trachea is midline.  Heart size normal.  Lungs are clear. No pleural fluid.   IMPRESSION: No acute findings.   Original Report Authenticated By: Leanna Battles, M.D.   Dg Chest 2 View (if Patient Has Fever And/or Copd)  10/06/2012   *RADIOLOGY REPORT*  Clinical Data: Chest tightness, wheezing, shortness of breath, cough and history of asthma.  CHEST - 2 VIEW  Comparison: 05/22/2012  Findings: Stable mild chronic bronchial thickening without focal infiltrate.  Lung volumes are normal.  No pneumothorax, edema or pleural fluid is identified.  The heart size and mediastinal contours are within normal limits.  No bony abnormalities are seen.  IMPRESSION: Chronic bronchial thickening without focal infiltrate.   Original Report Authenticated By: Irish Lack, M.D.    Date: 10/07/2012  Rate: 105  Rhythm: sinus tachycardia  QRS Axis: normal  Intervals: normal  ST/T Wave abnormalities: nonspecific ST/T changes  Conduction Disutrbances:none  Narrative Interpretation:  No changes from EKG obtained 10/06/2012.  Old EKG Reviewed: unchanged     1. Status asthmaticus, moderate persistent       MDM  42 year old female who left AMA yesterday from the hospital after being admitted for an asthma exacerbation, symptoms worsen and return to the ED. She was given 3 nebulizer treatments along with Solu-Medrol without any change. IV magnesium given. She will be admitted back to the internal medicine teaching service. Admission accepted by Dr. Clyde Lundborg.      Trevor Mace, PA-C 10/07/12 1745

## 2012-10-07 NOTE — H&P (Signed)
Internal Medicine Attending  Date: 10/07/2012  Patient name: April [name] Medical record number: [id number] Date of birth: 20-Sep-1970 Age: 42 y.o. Gender: female  I did not see the patient because she left the hospital against medical advice.

## 2012-10-07 NOTE — Progress Notes (Signed)
Patient decided to leave AMA.  Physicians spoke with the patient and explained the risks of leaving thoroughly.  No medications were administered that were ordered as an inpatient.  IV and heart monitor removed.  Arva Chafe

## 2012-10-07 NOTE — Telephone Encounter (Signed)
LMOM TCB x1.  Pt did go to the ER yesterday and it appears that she was to be admitted for asthma exacerbation but pt left AMA.

## 2012-10-07 NOTE — ED Notes (Signed)
Pt sts she is not feeling well and brought to back to get breathing treatment where she stated that she is hungry and has no money; pt given [place] sandwich and neb treatment

## 2012-10-07 NOTE — Telephone Encounter (Signed)
Now she is saying that she is going to go back to the hospital.

## 2012-10-08 ENCOUNTER — Encounter: Payer: Self-pay | Admitting: *Deleted

## 2012-10-08 ENCOUNTER — Ambulatory Visit (INDEPENDENT_AMBULATORY_CARE_PROVIDER_SITE_OTHER): Payer: Commercial Managed Care - PPO

## 2012-10-08 DIAGNOSIS — J45909 Unspecified asthma, uncomplicated: Secondary | ICD-10-CM

## 2012-10-08 MED ORDER — PREDNISONE 20 MG PO TABS: ORAL_TABLET | ORAL | Status: DC

## 2012-10-08 MED ORDER — OMEPRAZOLE 20 MG PO CPDR: 20.0000 mg | DELAYED_RELEASE_CAPSULE | Freq: Two times a day (BID) | ORAL | Status: DC

## 2012-10-08 MED ORDER — ALBUTEROL SULFATE HFA 108 (90 BASE) MCG/ACT IN AERS
2.0000 | INHALATION_SPRAY | RESPIRATORY_TRACT | Status: DC | PRN
  Filled 2012-10-08: qty 6.7

## 2012-10-08 MED ORDER — ALBUTEROL SULFATE HFA 108 (90 BASE) MCG/ACT IN AERS: 2.0000 | INHALATION_SPRAY | RESPIRATORY_TRACT | Status: DC | PRN

## 2012-10-08 MED ORDER — ACETAMINOPHEN 325 MG PO TABS
650.0000 mg | ORAL_TABLET | Freq: Four times a day (QID) | ORAL | Status: DC | PRN
  Filled 2012-10-08: qty 2

## 2012-10-08 MED ORDER — MOMETASONE FURO-FORMOTEROL FUM 100-5 MCG/ACT IN AERO
2.0000 | INHALATION_SPRAY | Freq: Two times a day (BID) | RESPIRATORY_TRACT | Status: DC
  Filled 2012-10-08: qty 8.8

## 2012-10-08 MED ORDER — NICOTINE 21 MG/24HR TD PT24: 1.0000 | MEDICATED_PATCH | Freq: Every day | TRANSDERMAL | Status: DC

## 2012-10-08 NOTE — Care Management Note (Signed)
    Page 1 of 1   10/08/2012     3:09:55 PM   CARE MANAGEMENT NOTE 10/08/2012  Patient:  April [name], April [name]   Account Number:  [id number]  Date Initiated:  10/08/2012  Documentation initiated by:  Letha Cape  Subjective/Objective Assessment:   dx asthma ex  admit- lives alone.  pta indep.     Action/Plan:   Anticipated DC Date:  10/08/2012   Anticipated DC Plan:  HOME/SELF CARE      DC Planning Services  CM consult      Choice offered to / List presented to:             Status of service:  Completed, signed off Medicare Important Message given?   (If response is "NO", the following Medicare IM given date fields will be blank) Date Medicare IM given:   Date Additional Medicare IM given:    Discharge Disposition:  HOME/SELF CARE  Per UR Regulation:  Reviewed for med. necessity/level of care/duration of stay  If discussed at Long Length of Stay Meetings, dates discussed:    Comments:  10/08/12 15:07 Letha Cape RN, BSN [phone] patient lives alone, pta indep.  Patient has insurance and transportation.  There was a referral for med ast, MD will write order for patient inhalers, prednisone is $4 at [facility] and protonix is otc.  Patient was asking about patch for smoking, informed her that this is otc and she will need to speak with MD about that.  Patient is for dc today.

## 2012-10-08 NOTE — Telephone Encounter (Signed)
Per Florentina Addison the hospital can treat pt right now and once she gets out she can get xolair. Also can't get xolair while being so sick.  LMTCB x1 for pt

## 2012-10-08 NOTE — Telephone Encounter (Signed)
Pt called from [facility] this morning- she wants to get her xolair shot while in the hospital. Wants a call back asap this am. Cell #April [name]

## 2012-10-08 NOTE — Progress Notes (Signed)
UR COMPLETED  

## 2012-10-08 NOTE — Telephone Encounter (Signed)
LMOMTCB x 1 

## 2012-10-08 NOTE — Telephone Encounter (Signed)
I spoke with April [name]. She stated she wants to come over to the office either today, tomorrow, or Monday for her xolair. She stated she is not sick she is better now. Please advise Dr. Maple Hudson thanks

## 2012-10-08 NOTE — Progress Notes (Signed)
NURSING PROGRESS NOTE  April [name] [id number] Discharge Data: 10/08/2012 2:53 PM Attending Provider: Lars Mage, MD PCP:HO,MICHELE, MD     [name] to be D/C'd Home per MD order.  Discussed with the patient the After Visit Summary and all questions fully answered. All IV's discontinued with no bleeding noted. All belongings returned to patient for patient to take home.   Last Vital Signs:  Blood pressure 137/77, pulse 105, temperature 97.5 F (36.4 C), temperature source Oral, resp. rate 20, height 5\' 6"  (1.676 m), weight 145.151 kg (320 lb), last menstrual period 10/04/2012, SpO2 96.00%.  Discharge Medication List   Medication List    TAKE these medications       albuterol 108 (90 BASE) MCG/ACT inhaler  Commonly known as:  PROVENTIL HFA;VENTOLIN HFA  Inhale 2 puffs into the lungs every 4 (four) hours as needed for wheezing or shortness of breath.     albuterol 108 (90 BASE) MCG/ACT inhaler  Commonly known as:  PROVENTIL HFA;VENTOLIN HFA  Inhale 2 puffs into the lungs every 4 (four) hours as needed for wheezing or shortness of breath. For wheezing, shortness of breath.     albuterol (2.5 MG/3ML) 0.083% nebulizer solution  Commonly known as:  PROVENTIL  Take 2.5 mg by nebulization every 4 (four) hours as needed for wheezing or shortness of breath.     clonazePAM 0.5 MG tablet  Commonly known as:  KLONOPIN  Take 0.5-1 mg by mouth at bedtime as needed (sleep).     EPIPEN 0.3 mg/0.3 mL Devi  Generic drug:  EPINEPHrine  Inject 0.3 mg into the muscle daily as needed (anaphylaxis).     Fluticasone-Salmeterol 250-50 MCG/DOSE Aepb  Commonly known as:  ADVAIR DISKUS  Inhale 1 puff into the lungs 2 (two) times daily. Rinse mouth     nicotine 21 mg/24hr patch  Commonly known as:  NICODERM CQ - dosed in mg/24 hours  Place 1 patch onto the skin daily.     omalizumab 150 MG injection  Commonly known as:  XOLAIR  Inject 225 mg into the skin every 14 (fourteen) days.     omeprazole 20 MG capsule  Commonly known as:  PRILOSEC  Take 1 capsule (20 mg total) by mouth 2 (two) times daily before a meal.     omeprazole 20 MG tablet  Commonly known as:  PRILOSEC OTC  Take 20-40 mg by mouth 2 (two) times daily.     predniSONE 20 MG tablet  Commonly known as:  DELTASONE  Take 40mg  for 3 days then 20 mg until finished

## 2012-10-08 NOTE — Progress Notes (Signed)
Subjective: Pt feels extensively better. Minimal wheezing this AM. Pt main concern is affording medication. But feels wants to leave today and get back to working. Pt continues to smoke but would like some nicotine patches to help with cessation. 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   Objective: Vital signs in last 24 hours: Filed Vitals:   10/07/12 2146 10/07/12 2158 10/08/12 0221 10/08/12 0517  BP: 132/84   137/77  Pulse: 93   105  Temp:    97.5 F (36.4 C)  TempSrc:    Oral  Resp: 20   20  Height: 5\' 6"  (1.676 m)     Weight: 320 lb (145.151 kg)     SpO2:  98% 100% 96%   Weight change:   Intake/Output Summary (Last 24 hours) at 10/08/12 1002 Last data filed at 10/08/12 0900  Gross per 24 hour  Intake    482 ml  Output      0 ml  Net    482 ml   General: resting in bed, NAD  HEENT: PERRL, EOMI, no scleral icterus Cardiac: RRR, no rubs, murmurs or gallops Pulm: clear to auscultation bilaterally with minimal expiratory wheezes, moving normal volumes of air Abd: soft, nontender, nondistended, BS present Ext: warm and well perfused, no pedal edema Neuro: alert and oriented X3, cranial nerves II-XII grossly intact  Lab Results: Basic Metabolic Panel:  Recent Labs Lab 10/06/12 1802 10/07/12 2159  NA 141 136  K 3.6 3.8  CL 104 103  CO2 29 23  GLUCOSE 91 196*  BUN 7 8  CREATININE 0.81 0.69  CALCIUM 8.8 8.9   Liver Function Tests:  Recent Labs Lab 10/06/12 1802  AST 18  ALT 14  ALKPHOS 76  BILITOT 0.2*  PROT 6.8  ALBUMIN 3.1*   CBC:  Recent Labs Lab 10/06/12 1802 10/07/12 2159  WBC 15.9* 15.9*  NEUTROABS 9.8* 14.4*  HGB 12.5 12.5  HCT 37.4 37.1  MCV 84.4 84.7  PLT 375 397   Cardiac Enzymes:  Recent Labs Lab 10/07/12 0018 10/07/12 2201  TROPONINI <0.30 <0.30   Micro Results: No results found for this or any previous visit (from the past 240 hour(s)). Studies/Results: Dg Chest 2 View  10/07/2012   *RADIOLOGY  REPORT*  Clinical Data: Chest pain and shortness of breath.  CHEST - 2 VIEW  Comparison: 10/06/2012.  Findings: Trachea is midline.  Heart size normal.  Lungs are clear. No pleural fluid.  IMPRESSION: No acute findings.   Original Report Authenticated By: Leanna Battles, M.D.   Dg Chest 2 View (if Patient Has Fever And/or Copd)  10/06/2012   *RADIOLOGY REPORT*  Clinical Data: Chest tightness, wheezing, shortness of breath, cough and history of asthma.  CHEST - 2 VIEW  Comparison: 05/22/2012  Findings: Stable mild chronic bronchial thickening without focal infiltrate.  Lung volumes are normal.  No pneumothorax, edema or pleural fluid is identified.  The heart size and mediastinal contours are within normal limits.  No bony abnormalities are seen.  IMPRESSION: Chronic bronchial thickening without focal infiltrate.   Original Report Authenticated By: Irish Lack, M.D.   Medications: I have reviewed the patient's current medications. Scheduled Meds: . ipratropium  0.5 mg Nebulization Q4H   And  . albuterol  2.5 mg Nebulization Q4H  . heparin  5,000 Units Subcutaneous Q8H  . nicotine  21 mg Transdermal Daily  . omeprazole  20 mg Oral BID AC  . predniSONE  50 mg Oral Q  breakfast  . sodium chloride  3 mL Intravenous Q12H   Continuous Infusions: . sodium chloride 100 mL/hr at 10/07/12 2101   PRN Meds:.sodium chloride, albuterol, clonazePAM, guaifenesin, sodium chloride Assessment/Plan: #Asthma Exacerbation: History of poorly controlled asthma and ?mild COPD from PFT in 2002 but bronchodilators not performed at that time. Pt has hx of extensive medication noncompliance due to cost. Continued smoking is contributing to her symptoms and poor control of asthma. Home meds include albuterol inhaler and neb, Advair (sometimes Dulera or Symbicort), claritin and Xollair (intermittently since 2011). Followed by Dr. Maple Hudson with [facility] pulm. CXR -without focal infiltrate. Pt greatly improved in ED with nebs and IV  Mg. Pt had some ? T wave inversions on EKG - PO prednisone 50mg  daily  - Scheduled DuoNebs - q4hours  - Oxygen therapy to keep SO2 above 92%  -d/c IVF -advance to regular diet - Monitor peak flow measurements to guide treatment - Xollair to be resumed as outpatient   # HTN: BP continues to be normotensive 130/70s. On HCTZ at home.  -restart HCTZ  # GERD: Prilosec 20 mg bid. Stable.   #Tobacco Abuse: Contributing to asthma symptoms.  -Nicotine patch.  -SW consult    Dispo: Disposition is deferred at this time, awaiting improvement of current medical problems.  Anticipated discharge in approximately 1-2 day(s).   The patient does have a current PCP Mathis Dad, MD) and does need an [facility] hospital follow-up appointment after discharge.  The patient does not have transportation limitations that hinder transportation to clinic appointments.  .Services Needed at time of discharge: Y = Yes, Blank = No PT:   OT:   RN:   Equipment:   Other:     LOS: 1 day   Christen Bame, MD 10/08/2012, 10:02 AM Pgr: [phone]

## 2012-10-08 NOTE — Discharge Summary (Addendum)
Name: April [name] MRN: [id number] DOB: [birth year] 42 y.o. PCP: Mathis Dad, MD  Date of Admission: 10/07/2012  4:28 PM Date of Discharge: 10/08/2012 Attending Physician: Lars Mage, MD  Discharge Diagnosis: Active Problems:   DEPRESSION   HYPERTENSION, ESSENTIAL NOS   Esophageal reflux   Insomnia   Asthma exacerbation   Tobacco abuse  Discharge Medications:   Medication List    TAKE these medications       albuterol 108 (90 BASE) MCG/ACT inhaler  Commonly known as:  PROVENTIL HFA;VENTOLIN HFA  Inhale 2 puffs into the lungs every 4 (four) hours as needed for wheezing or shortness of breath.     albuterol 108 (90 BASE) MCG/ACT inhaler  Commonly known as:  PROVENTIL HFA;VENTOLIN HFA  Inhale 2 puffs into the lungs every 4 (four) hours as needed for wheezing or shortness of breath. For wheezing, shortness of breath.     albuterol (2.5 MG/3ML) 0.083% nebulizer solution  Commonly known as:  PROVENTIL  Take 2.5 mg by nebulization every 4 (four) hours as needed for wheezing or shortness of breath.     clonazePAM 0.5 MG tablet  Commonly known as:  KLONOPIN  Take 0.5-1 mg by mouth at bedtime as needed (sleep).     EPIPEN 0.3 mg/0.3 mL Devi  Generic drug:  EPINEPHrine  Inject 0.3 mg into the muscle daily as needed (anaphylaxis).     Fluticasone-Salmeterol 250-50 MCG/DOSE Aepb  Commonly known as:  ADVAIR DISKUS  Inhale 1 puff into the lungs 2 (two) times daily. Rinse mouth     nicotine 21 mg/24hr patch  Commonly known as:  NICODERM CQ - dosed in mg/24 hours  Place 1 patch onto the skin daily.     omalizumab 150 MG injection  Commonly known as:  XOLAIR  Inject 225 mg into the skin every 14 (fourteen) days.     omeprazole 20 MG capsule  Commonly known as:  PRILOSEC  Take 1 capsule (20 mg total) by mouth 2 (two) times daily before a meal.     omeprazole 20 MG tablet  Commonly known as:  PRILOSEC OTC  Take 20-40 mg by mouth 2 (two) times daily.     predniSONE 20 MG  tablet  Commonly known as:  DELTASONE  Take 40mg  for 3 days then 20 mg until finished        Disposition and follow-up:   April [name] was discharged from [facility] in Stable condition.  At the hospital follow up visit please address:  1.  Medication compliance/ Medication assistance for Asthma management, smoking cessation  2.  Labs / imaging needed at time of follow-up: none  3.  Pending labs/ test needing follow-up: none  Follow-up Appointments:   Discharge Instructions:     Discharge Orders   Future Appointments Provider Department Dept Phone   04/05/2013 2:00 PM [name], MD [facility] [phone]   Future Orders Complete By Expires     Increase activity slowly  As directed        Consultations:    Procedures Performed:  Dg Chest 2 View  10/07/2012   *RADIOLOGY REPORT*  Clinical Data: Chest pain and shortness of breath.  CHEST - 2 VIEW  Comparison: 10/06/2012.  Findings: Trachea is midline.  Heart size normal.  Lungs are clear. No pleural fluid.  IMPRESSION: No acute findings.   Original Report Authenticated By: Leanna Battles, M.D.   Dg Chest 2 View (if Patient Has Fever  And/or Copd)  10/06/2012   *RADIOLOGY REPORT*  Clinical Data: Chest tightness, wheezing, shortness of breath, cough and history of asthma.  CHEST - 2 VIEW  Comparison: 05/22/2012  Findings: Stable mild chronic bronchial thickening without focal infiltrate.  Lung volumes are normal.  No pneumothorax, edema or pleural fluid is identified.  The heart size and mediastinal contours are within normal limits.  No bony abnormalities are seen.  IMPRESSION: Chronic bronchial thickening without focal infiltrate.   Original Report Authenticated By: Irish Lack, M.D.   Admission HPI: 42 year old woman with pmhx of morbid obesity, COPD, HTN, tobacco abuse, depression and GERD, asthma, who presents with SOB and wheezing.  Patient came to ED yesterday and was evaluated by our  team. She was admitted for asthma exacerbation on 6/10, but she left against AMA because that she had to go back to work. Since her symptoms have not improved, she comes back to ED today.  She reports that she has been taking her medications regularly. But she has frequent asthma exacerbation with frequent exacerbations every 2-3 weeks (followed by Dr Maple Hudson of pulmonology, no prev intubations). In past 3 days, her shortness of breath has been getting worse. It was associated with wheezing and increased coughing. She tried her usual inhalers but this did not help her SOB and therefore presented to the ED. She usually uses her albuterol Q1hr however, over the last one week she is been using it 3-4x/hour and had also increased her Dulera use from 2 to 4 times day. Her albuterol canister last her one week. She reports recurrent cough and several episodes of yellow sputum production which happened since yesteray, chest pain with coughing. She denies external chest pain. She also reports dizziness and "occasionally passing out for a few seconds when I cough too much''. She reports subjective fevers without chills. She reports feeling increasingly fatigued over the past 3 days.  Since Jan 2011 she has intermittently been on Xolair injection Q2 weeks by Dr Maple Hudson. Has been having challenges affording this medication due to insurance coverage. Her last injection was 6 weeks ago. She has noticed that her SOB has worsened since being off Xollair. Most recently hospitalized 05/22/12-05/23/12 for asthma excerbation d/t viral URI. 2 weeks ago she was evaluated in the ED for acute asthma exacerbation.  When I saw patient in ED, patient feels better after treated with nebulizers and 2 g of magnesium sulfate in ED. Her oxygen saturation is 100% at room air.   Hospital Course by problem list: #Asthma Exacerbation: History of poorly controlled asthma and ?mild COPD from PFT in 2002 but bronchodilators not performed at that time.  Pt has hx of extensive medication noncompliance due to cost. Continued smoking is contributing to her symptoms and poor control of asthma. Home meds include albuterol inhaler and neb, Advair (sometimes Dulera or Symbicort), claritin and Xollair (intermittently since 2011). Followed by Dr. Maple Hudson with [facility] pulm. CXR -without focal infiltrate. Pt greatly improved in ED with nebs and IV Mg. Pt had some ? T wave inversions on EKG that on repeat EKG had resolved this maybe in setting of tachycardia induced by continuous nebulizer treatments. Pt didn't have any CP during admission. Peak flows on the floor were in 300s and pt had complete resolution of wheezes on exam- no peak flows were performed in ED for comparison. Pt was continued on prednisone taper and provided with inhalers (albuterol and Advair) upon discharge. Pt already had f/u with pulmonology clinic for Xollair injection.  Pt was also given work excuse letter. Case management had informed pt that unable to provide medications given pt had already received benefits w/in a 12 month period. This was discussed with the patient and scripts for prednisone and protonix along with narcotics were given as pt stated has insurance and will get paid soon to be able to afford prescriptions.   # HTN: BP continues to be normotensive 130/70s. On HCTZ at home this was restarted at d/c.   # GERD: Prilosec 20 mg bid. Stable.   #Tobacco Abuse: Contributing to asthma symptoms. Nicotiene patches were also prescribed for the patient and a free sample given at discharge.   Discharge Vitals:   BP 137/77  Pulse 105  Temp(Src) 97.5 F (36.4 C) (Oral)  Resp 20  Ht 5\' 6"  (1.676 m)  Wt 320 lb (145.151 kg)  BMI 51.67 kg/m2  SpO2 96%  LMP 10/04/2012 General: resting in bed, NAD  HEENT: PERRL, EOMI, no scleral icterus  Cardiac: RRR, no rubs, murmurs or gallops  Pulm: clear to auscultation bilaterally with minimal expiratory wheezes, moving normal volumes of air  Abd: soft,  nontender, nondistended, BS present  Ext: warm and well perfused, no pedal edema  Neuro: alert and oriented X3, cranial nerves II-XII grossly intact  Discharge Labs:  Results for orders placed during the hospital encounter of 10/07/12 (from the past 24 hour(s))  BASIC METABOLIC PANEL     Status: Abnormal   Collection Time    10/07/12  9:59 PM      Result Value Range   Sodium 136  135 - 145 mEq/L   Potassium 3.8  3.5 - 5.1 mEq/L   Chloride 103  96 - 112 mEq/L   CO2 23  19 - 32 mEq/L   Glucose, Bld 196 (*) 70 - 99 mg/dL   BUN 8  6 - 23 mg/dL   Creatinine, Ser 1.61  0.50 - 1.10 mg/dL   Calcium 8.9  8.4 - 09.6 mg/dL   GFR calc non Af Amer >90  >90 mL/min   GFR calc Af Amer >90  >90 mL/min  CBC WITH DIFFERENTIAL     Status: Abnormal   Collection Time    10/07/12  9:59 PM      Result Value Range   WBC 15.9 (*) 4.0 - 10.5 K/uL   RBC 4.38  3.87 - 5.11 MIL/uL   Hemoglobin 12.5  12.0 - 15.0 g/dL   HCT 04.5  40.9 - 81.1 %   MCV 84.7  78.0 - 100.0 fL   MCH 28.5  26.0 - 34.0 pg   MCHC 33.7  30.0 - 36.0 g/dL   RDW 91.4 (*) 78.2 - 95.6 %   Platelets 397  150 - 400 K/uL   Neutrophils Relative % 91 (*) 43 - 77 %   Neutro Abs 14.4 (*) 1.7 - 7.7 K/uL   Lymphocytes Relative 8 (*) 12 - 46 %   Lymphs Abs 1.3  0.7 - 4.0 K/uL   Monocytes Relative 1 (*) 3 - 12 %   Monocytes Absolute 0.2  0.1 - 1.0 K/uL   Eosinophils Relative 0  0 - 5 %   Eosinophils Absolute 0.0  0.0 - 0.7 K/uL   Basophils Relative 0  0 - 1 %   Basophils Absolute 0.0  0.0 - 0.1 K/uL  TROPONIN I     Status: None   Collection Time    10/07/12 10:01 PM      Result Value Range  Troponin I <0.30  <0.30 ng/mL    Signed: Christen Bame, MD 10/08/2012, 2:04 PM   Time Spent on Discharge: 35 minutes Services Ordered on Discharge: none Equipment Ordered on Discharge: none

## 2012-10-08 NOTE — H&P (Signed)
Internal Medicine Attending Admission Note Date: 10/08/2012  Patient name: April [name] Medical record number: [id number] Date of birth: 1970/11/09 Age: 42 y.o. Gender: female  I saw and evaluated the patient. I reviewed the resident's note and I agree with the resident's findings and plan as documented in the resident's note.  Chief Complaint(s): Shortness of breath and wheezing  History - key components related to admission: Patient is a 42 year old female with past medical history most significant for morbid obesity, asthma, depression, COPD, hypertension and ongoing tobacco abuse who presented with shortness of breath and wheezing for several days. Patient was seen in the ER 3 days prior to admission and was advised on admission but she left AGAINST MEDICAL ADVICE. Patient presented to emergency room again yesterday that is on the day of admission with similar complaints as her symptoms did not improve. Patient is well-known to our service and has been admitted multiple times due to medication noncompliance and continued smoking in the setting of severe asthma. This particular episode started with progressive shortness of breath 5 days prior to admission. Shortness of breath was associated with wheezing and increased cough. Patient increased the use of her albuterol inhaler which did not improve her condition and eventually patient was admitted.  Since admission yesterday patient feels much better and her peak flow chest at the time of my visit was 350 cc. She is anxious to go home at this time.  Review of system was negative for recent travel, exposure to pets, sick contacts or change in environment. Patient denies fevers, chills, lower extremity edema, difficulty sleeping at nighttime. She wants to quit but has been having difficulty. Patient has been having difficulty affording her medications do to insurance coverage.   15 point review of systems was negative except what is noted above in the  history of present illness.  Past medical history, past surgical history, family history, social history and medications were reviewed and are as per resident's note.  Physical Exam - key components related to admission:  Filed Vitals:   10/07/12 2146 10/07/12 2158 10/08/12 0221 10/08/12 0517  BP: 132/84   137/77  Pulse: 93   105  Temp:    97.5 F (36.4 C)  TempSrc:    Oral  Resp: 20   20  Height: 5\' 6"  (1.676 m)     Weight: 320 lb (145.151 kg)     SpO2:  98% 100% 96%  Physical Exam: General: Vital signs reviewed and noted. Well-developed, well-nourished, in no acute distress; alert, appropriate and cooperative throughout examination.  Head: Normocephalic, atraumatic.  Eyes: PERRL, EOMI, No signs of anemia or jaundince.  Nose: Mucous membranes moist, not inflammed, nonerythematous.  Throat: Oropharynx nonerythematous, no exudate appreciated.   Neck: No deformities, masses, or tenderness noted.Supple, No carotid Bruits, no JVD.  Lungs:  Normal respiratory effort. Patient had only mild end expiratory wheezing throughout bilateral lung fields otherwise without crackles or wheezes or rhonchi.  Heart: RRR. S1 and S2 normal without gallop, murmur, or rubs.  Abdomen:  BS normoactive. Soft, Nondistended, non-tender.  No masses or organomegaly.  Extremities: No pretibial edema.  Neurologic: A&O X3, CN II - XII are grossly intact. Motor strength is 5/5 in the all 4 extremities, Sensations intact to light touch, Cerebellar signs negative.  Skin: No visible rashes, scars.     Lab results:   Basic Metabolic Panel:  Recent Labs  78/29/56 1802 10/07/12 2159  NA 141 136  K 3.6 3.8  CL 104 103  CO2 29 23  GLUCOSE 91 196*  BUN 7 8  CREATININE 0.81 0.69  CALCIUM 8.8 8.9   Liver Function Tests:  Recent Labs  10/06/12 1802  AST 18  ALT 14  ALKPHOS 76  BILITOT 0.2*  PROT 6.8  ALBUMIN 3.1*   CBC:  Recent Labs  10/06/12 1802 10/07/12 2159  WBC 15.9* 15.9*  NEUTROABS 9.8*  14.4*  HGB 12.5 12.5  HCT 37.4 37.1  MCV 84.4 84.7  PLT 375 397   Cardiac Enzymes:  Recent Labs  10/07/12 0018 10/07/12 2201  TROPONINI <0.30 <0.30   Imaging results:  Dg Chest 2 View  10/07/2012   *RADIOLOGY REPORT*  Clinical Data: Chest pain and shortness of breath.  CHEST - 2 VIEW  Comparison: 10/06/2012.  Findings: Trachea is midline.  Heart size normal.  Lungs are clear. No pleural fluid.  IMPRESSION: No acute findings.   Original Report Authenticated By: Leanna Battles, M.D.   Dg Chest 2 View (if Patient Has Fever And/or Copd)  10/06/2012   *RADIOLOGY REPORT*  Clinical Data: Chest tightness, wheezing, shortness of breath, cough and history of asthma.  CHEST - 2 VIEW  Comparison: 05/22/2012  Findings: Stable mild chronic bronchial thickening without focal infiltrate.  Lung volumes are normal.  No pneumothorax, edema or pleural fluid is identified.  The heart size and mediastinal contours are within normal limits.  No bony abnormalities are seen.  IMPRESSION: Chronic bronchial thickening without focal infiltrate.   Original Report Authenticated By: Irish Lack, M.D.    Other results: EKG: 105 beats per minute, normal axis, sinus rhythm, nonspecific ST and T wave changes which were also seen in previous EKG done on 10/06/2012  Assessment & Plan by Problem: Principal problem: Asthma exacerbation Active Problems:   DEPRESSION   HYPERTENSION, ESSENTIAL NOS   Esophageal reflux   Insomnia   Asthma exacerbation   Tobacco abuse  Patient is a 43 year old female with past medical history as noted above was admitted yet again with COPD exacerbation. Patient does not have any signs of infection. The reason for this exacerbation seems to be medication noncompliance and continued use of tobacco smoking. I believe that with peak flow of 350 patient is currently ready to be discharged home but I fear that patient may come right back due to lack of inhalers and medications at home.  At this  time we are going to focus on providing her with medications and inhalers.  Patient will need albuterol and Advair at home along with prednisone taper. She told me that she will be able to afford the prednisone taper as it's only dollar 4 but will have difficulty obtaining the inhalers. We will request case management to assist with medications to avoid future readmissions. We will also see our clinic inventory with her we will be able to get her inhalers from there. The patient was advised to followup with [place] in the clinic. I think patient can be discharged home today we can figure out how to get her medications after discharge.  Rest of the medical management as per resident's note.  Lars Mage MD Faculty-Internal Medicine Residency Program

## 2012-10-08 NOTE — Telephone Encounter (Signed)
Spoke with the pt and notified of recs per CDY She verbalized understanding and denies any further needs

## 2012-10-08 NOTE — Telephone Encounter (Signed)
Per CY-she can come for her Xolair injection once she has been completely d/c'd from hospital.

## 2012-10-08 NOTE — Progress Notes (Signed)
PT preformed Peak Flow: Fev 1.67 (63.8%); PEFR 235 (70.4%). Pt preformed x3 with good effort.

## 2012-10-08 NOTE — Progress Notes (Signed)
Pt admitted to the unit. Pt is alert and oriented. Pt oriented to room, staff, and call bell. Bed in lowest position. Full assessment to Epic. Call bell with in reach. Told to call for assists. Will continue to monitor.  Demaurion Dicioccio E  

## 2012-10-13 MED ORDER — OMALIZUMAB 150 MG ~~LOC~~ SOLR
225.0000 mg | Freq: Once | SUBCUTANEOUS | Status: AC
  Administered 2012-10-13: 225 mg via SUBCUTANEOUS

## 2012-10-16 ENCOUNTER — Encounter: Payer: Self-pay | Admitting: Internal Medicine

## 2012-10-16 NOTE — Assessment & Plan Note (Signed)
Severe asthma with exacerbation related to being off Xolair due to insurance issue. Now restarted Xolair. Med review- needs LABA/ ICS.  Plan- resume Xolair, Rx Advair, discussed compliance.

## 2012-10-21 ENCOUNTER — Emergency Department (HOSPITAL_COMMUNITY): Payer: Commercial Managed Care - PPO

## 2012-10-21 ENCOUNTER — Encounter (HOSPITAL_COMMUNITY): Payer: Self-pay | Admitting: Emergency Medicine

## 2012-10-21 ENCOUNTER — Inpatient Hospital Stay (HOSPITAL_COMMUNITY)
Admission: EM | Admit: 2012-10-21 | Discharge: 2012-10-23 | DRG: 190 | Disposition: A | Discharge status: Home / Self Care | Payer: Commercial Managed Care - PPO | Attending: Internal Medicine | Admitting: Internal Medicine

## 2012-10-21 ENCOUNTER — Telehealth: Payer: Self-pay | Admitting: Internal Medicine

## 2012-10-21 DIAGNOSIS — J45909 Unspecified asthma, uncomplicated: Secondary | ICD-10-CM

## 2012-10-21 DIAGNOSIS — IMO0002 Reserved for concepts with insufficient information to code with codable children: Secondary | ICD-10-CM

## 2012-10-21 DIAGNOSIS — J9600 Acute respiratory failure, unspecified whether with hypoxia or hypercapnia: Secondary | ICD-10-CM | POA: Diagnosis present

## 2012-10-21 DIAGNOSIS — F3289 Other specified depressive episodes: Secondary | ICD-10-CM | POA: Diagnosis present

## 2012-10-21 DIAGNOSIS — G4733 Obstructive sleep apnea (adult) (pediatric): Secondary | ICD-10-CM | POA: Diagnosis present

## 2012-10-21 DIAGNOSIS — J45901 Unspecified asthma with (acute) exacerbation: Secondary | ICD-10-CM | POA: Diagnosis present

## 2012-10-21 DIAGNOSIS — R000 Tachycardia, unspecified: Secondary | ICD-10-CM | POA: Diagnosis present

## 2012-10-21 DIAGNOSIS — E119 Type 2 diabetes mellitus without complications: Secondary | ICD-10-CM

## 2012-10-21 DIAGNOSIS — E46 Unspecified protein-calorie malnutrition: Secondary | ICD-10-CM | POA: Diagnosis present

## 2012-10-21 DIAGNOSIS — K219 Gastro-esophageal reflux disease without esophagitis: Secondary | ICD-10-CM | POA: Diagnosis present

## 2012-10-21 DIAGNOSIS — F329 Major depressive disorder, single episode, unspecified: Secondary | ICD-10-CM | POA: Diagnosis present

## 2012-10-21 DIAGNOSIS — R739 Hyperglycemia, unspecified: Secondary | ICD-10-CM

## 2012-10-21 DIAGNOSIS — Z9119 Patient's noncompliance with other medical treatment and regimen: Secondary | ICD-10-CM

## 2012-10-21 DIAGNOSIS — Z91199 Patient's noncompliance with other medical treatment and regimen due to unspecified reason: Secondary | ICD-10-CM

## 2012-10-21 DIAGNOSIS — J449 Chronic obstructive pulmonary disease, unspecified: Principal | ICD-10-CM | POA: Diagnosis present

## 2012-10-21 DIAGNOSIS — J9691 Respiratory failure, unspecified with hypoxia: Secondary | ICD-10-CM

## 2012-10-21 DIAGNOSIS — F17200 Nicotine dependence, unspecified, uncomplicated: Secondary | ICD-10-CM | POA: Diagnosis present

## 2012-10-21 DIAGNOSIS — R0982 Postnasal drip: Secondary | ICD-10-CM | POA: Diagnosis present

## 2012-10-21 DIAGNOSIS — G4730 Sleep apnea, unspecified: Secondary | ICD-10-CM

## 2012-10-21 DIAGNOSIS — Z6841 Body Mass Index (BMI) 40.0 and over, adult: Secondary | ICD-10-CM

## 2012-10-21 DIAGNOSIS — J4542 Moderate persistent asthma with status asthmaticus: Secondary | ICD-10-CM

## 2012-10-21 DIAGNOSIS — F411 Generalized anxiety disorder: Secondary | ICD-10-CM | POA: Diagnosis present

## 2012-10-21 DIAGNOSIS — D72829 Elevated white blood cell count, unspecified: Secondary | ICD-10-CM | POA: Diagnosis present

## 2012-10-21 DIAGNOSIS — J45902 Unspecified asthma with status asthmaticus: Principal | ICD-10-CM | POA: Diagnosis present

## 2012-10-21 DIAGNOSIS — J309 Allergic rhinitis, unspecified: Secondary | ICD-10-CM | POA: Diagnosis present

## 2012-10-21 DIAGNOSIS — Z720 Tobacco use: Secondary | ICD-10-CM

## 2012-10-21 DIAGNOSIS — I10 Essential (primary) hypertension: Secondary | ICD-10-CM | POA: Diagnosis present

## 2012-10-21 LAB — CBC
HCT: 35.5 % — ABNORMAL LOW (ref 36.0–46.0)
Hemoglobin: 11.8 g/dL — ABNORMAL LOW (ref 12.0–15.0)
MCHC: 33.2 g/dL (ref 30.0–36.0)

## 2012-10-21 LAB — POCT I-STAT 3, VENOUS BLOOD GAS (G3P V)
O2 Saturation: 36 %
TCO2: 29 mmol/L (ref 0–100)
pCO2, Ven: 53.7 mmHg — ABNORMAL HIGH (ref 45.0–50.0)
pO2, Ven: 24 mmHg — CL (ref 30.0–45.0)

## 2012-10-21 LAB — COMPREHENSIVE METABOLIC PANEL
BUN: 10 mg/dL (ref 6–23)
Calcium: 8.6 mg/dL (ref 8.4–10.5)
Creatinine, Ser: 0.78 mg/dL (ref 0.50–1.10)
GFR calc Af Amer: >90 mL/min (ref >90)
Glucose, Bld: 141 mg/dL — ABNORMAL HIGH (ref 70–99)
Sodium: 136 mEq/L (ref 135–145)
Total Protein: 6.7 g/dL (ref 6.0–8.3)

## 2012-10-21 LAB — MRSA PCR SCREENING: MRSA by PCR: NEGATIVE

## 2012-10-21 MED ORDER — ENOXAPARIN SODIUM 40 MG/0.4ML ~~LOC~~ SOLN
40.0000 mg | SUBCUTANEOUS | Status: DC
  Administered 2012-10-21: 40 mg via SUBCUTANEOUS
  Filled 2012-10-21 (×2): qty 0.4

## 2012-10-21 MED ORDER — PREDNISONE 50 MG PO TABS
60.0000 mg | ORAL_TABLET | Freq: Every day | ORAL | Status: DC
  Administered 2012-10-22 – 2012-10-23 (×2): 60 mg via ORAL
  Filled 2012-10-21 (×4): qty 1

## 2012-10-21 MED ORDER — IPRATROPIUM BROMIDE 0.02 % IN SOLN
0.5000 mg | Freq: Four times a day (QID) | RESPIRATORY_TRACT | Status: DC
  Administered 2012-10-21 – 2012-10-22 (×5): 0.5 mg via RESPIRATORY_TRACT
  Filled 2012-10-21 (×5): qty 2.5

## 2012-10-21 MED ORDER — PANTOPRAZOLE SODIUM 40 MG IV SOLR
40.0000 mg | Freq: Two times a day (BID) | INTRAVENOUS | Status: DC
  Administered 2012-10-21 – 2012-10-22 (×3): 40 mg via INTRAVENOUS
  Filled 2012-10-21 (×4): qty 40

## 2012-10-21 MED ORDER — CLONAZEPAM 1 MG PO TABS
0.5000 mg | ORAL_TABLET | Freq: Every evening | ORAL | Status: DC | PRN
  Administered 2012-10-21 – 2012-10-22 (×2): 1 mg via ORAL
  Filled 2012-10-21 (×2): qty 1

## 2012-10-21 MED ORDER — DIPHENHYDRAMINE HCL 50 MG/ML IJ SOLN
12.5000 mg | Freq: Once | INTRAMUSCULAR | Status: AC
  Administered 2012-10-21: 12.5 mg via INTRAVENOUS
  Filled 2012-10-21: qty 1

## 2012-10-21 MED ORDER — MAGNESIUM SULFATE 40 MG/ML IJ SOLN
4.0000 g | Freq: Once | INTRAMUSCULAR | Status: AC
  Administered 2012-10-21: 4 g via INTRAVENOUS
  Filled 2012-10-21: qty 100

## 2012-10-21 MED ORDER — ALBUTEROL SULFATE (5 MG/ML) 0.5% IN NEBU
2.5000 mg | INHALATION_SOLUTION | Freq: Four times a day (QID) | RESPIRATORY_TRACT | Status: DC
  Administered 2012-10-21 – 2012-10-22 (×5): 2.5 mg via RESPIRATORY_TRACT
  Filled 2012-10-21 (×4): qty 0.5

## 2012-10-21 MED ORDER — SODIUM CHLORIDE 0.9 % IV SOLN
INTRAVENOUS | Status: DC
  Administered 2012-10-21: 1000 mL via INTRAVENOUS
  Administered 2012-10-22: 08:00:00 via INTRAVENOUS

## 2012-10-21 MED ORDER — IPRATROPIUM BROMIDE 0.02 % IN SOLN
1.0000 mg | Freq: Once | RESPIRATORY_TRACT | Status: AC
  Administered 2012-10-21: 1 mg via RESPIRATORY_TRACT
  Filled 2012-10-21 (×2): qty 2.5

## 2012-10-21 MED ORDER — ALBUTEROL SULFATE (5 MG/ML) 0.5% IN NEBU
10.0000 mg | INHALATION_SOLUTION | Freq: Once | RESPIRATORY_TRACT | Status: AC
  Administered 2012-10-21: 10 mg via RESPIRATORY_TRACT
  Filled 2012-10-21: qty 2

## 2012-10-21 MED ORDER — ALBUTEROL SULFATE (5 MG/ML) 0.5% IN NEBU
2.5000 mg | INHALATION_SOLUTION | RESPIRATORY_TRACT | Status: DC | PRN
  Administered 2012-10-22 – 2012-10-23 (×5): 2.5 mg via RESPIRATORY_TRACT
  Filled 2012-10-21 (×6): qty 0.5

## 2012-10-21 MED ORDER — ACETAMINOPHEN 325 MG PO TABS
650.0000 mg | ORAL_TABLET | Freq: Four times a day (QID) | ORAL | Status: DC | PRN
  Administered 2012-10-21 – 2012-10-23 (×4): 650 mg via ORAL
  Filled 2012-10-21 (×5): qty 2

## 2012-10-21 MED ORDER — ACETAMINOPHEN 650 MG RE SUPP: 650.0000 mg | Freq: Four times a day (QID) | RECTAL | Status: DC | PRN

## 2012-10-21 MED ORDER — ONDANSETRON HCL 4 MG PO TABS: 4.0000 mg | ORAL_TABLET | Freq: Four times a day (QID) | ORAL | Status: DC | PRN

## 2012-10-21 MED ORDER — IPRATROPIUM BROMIDE 0.02 % IN SOLN
0.5000 mg | RESPIRATORY_TRACT | Status: DC | PRN
  Administered 2012-10-22 – 2012-10-23 (×3): 0.5 mg via RESPIRATORY_TRACT
  Filled 2012-10-21 (×4): qty 2.5

## 2012-10-21 MED ORDER — ALBUTEROL SULFATE (5 MG/ML) 0.5% IN NEBU
5.0000 mg | INHALATION_SOLUTION | Freq: Once | RESPIRATORY_TRACT | Status: AC
  Administered 2012-10-21: 5 mg via RESPIRATORY_TRACT
  Filled 2012-10-21: qty 1

## 2012-10-21 MED ORDER — DIPHENHYDRAMINE HCL 50 MG/ML IJ SOLN
25.0000 mg | Freq: Four times a day (QID) | INTRAMUSCULAR | Status: DC | PRN
  Administered 2012-10-22: 25 mg via INTRAVENOUS
  Filled 2012-10-21: qty 1

## 2012-10-21 MED ORDER — NICOTINE 14 MG/24HR TD PT24
14.0000 mg | MEDICATED_PATCH | Freq: Every day | TRANSDERMAL | Status: DC
  Administered 2012-10-21 – 2012-10-23 (×3): 14 mg via TRANSDERMAL
  Filled 2012-10-21 (×3): qty 1

## 2012-10-21 MED ORDER — SODIUM CHLORIDE 0.9 % IJ SOLN
3.0000 mL | Freq: Two times a day (BID) | INTRAMUSCULAR | Status: DC
  Administered 2012-10-21 – 2012-10-22 (×2): 3 mL via INTRAVENOUS

## 2012-10-21 MED ORDER — OXYCODONE-ACETAMINOPHEN 5-325 MG PO TABS
2.0000 | ORAL_TABLET | Freq: Once | ORAL | Status: AC
  Administered 2012-10-21: 2 via ORAL
  Filled 2012-10-21: qty 2

## 2012-10-21 MED ORDER — ONDANSETRON HCL 4 MG/2ML IJ SOLN: 4.0000 mg | Freq: Four times a day (QID) | INTRAMUSCULAR | Status: DC | PRN

## 2012-10-21 MED ORDER — PREDNISONE 20 MG PO TABS
60.0000 mg | ORAL_TABLET | Freq: Once | ORAL | Status: AC
  Administered 2012-10-21: 60 mg via ORAL
  Filled 2012-10-21: qty 3

## 2012-10-21 NOTE — ED Provider Notes (Signed)
History    CSN: [id number] Arrival date & time 10/21/12  0202  First MD Initiated Contact with Patient 10/21/12 [phone]     Chief Complaint  Patient presents with  . Asthma   (Consider location/radiation/quality/duration/timing/severity/associated sxs/prior Treatment) HPI Past Medical History  Diagnosis Date  . Acanthosis nigricans   . Menorrhagia   . Hypertension, essential   . Insomnia   . Morbid obesity   . Helicobacter pylori (H. pylori) infection   . Sleep apnea     Sleep study 2008 - mild OSA, not enough events to titrate CPAP  . COPD (chronic obstructive pulmonary disease)     PFTs in 2002, FEV1/FVC 65, no post bronchodilater test done  . Asthma     Followed by Dr. Maple Hudson (pulmonology); receives every other week omalizumab injections; has frequent exacerbations  . Depression   . GERD (gastroesophageal reflux disease)   . Tobacco user    Past Surgical History  Procedure Laterality Date  . Tubal ligation  1996    bilateral  . Breast reduction surgery  09/2011   Family History  Problem Relation Age of Onset  . Asthma Daughter   . Cancer Paternal Aunt   . Asthma Maternal Grandmother   . Hypertension Mother    History  Substance Use Topics  . Smoking status: Former Smoker    Types: Cigarettes    Quit date: 06/26/2007  . Smokeless tobacco: Never Used  . Alcohol Use: No   OB History   Grav Para Term Preterm Abortions TAB SAB Ect Mult Living                 Review of Systems  Allergies  Ace inhibitors; Percocet; and Zolpidem tartrate  Home Medications   Current Outpatient Rx  Name  Route  Sig  Dispense  Refill  . albuterol (PROVENTIL HFA;VENTOLIN HFA) 108 (90 BASE) MCG/ACT inhaler   Inhalation   Inhale 2 puffs into the lungs every 4 (four) hours as needed for wheezing or shortness of breath.         [name] albuterol (PROVENTIL) (2.5 MG/3ML) 0.083% nebulizer solution   Nebulization   Take 2.5 mg by nebulization every 4 (four) hours as needed for  wheezing or shortness of breath.          . clonazePAM (KLONOPIN) 0.5 MG tablet   Oral   Take 0.5-1 mg by mouth at bedtime as needed (sleep).         . EPINEPHrine (EPIPEN) 0.3 mg/0.3 mL DEVI   Intramuscular   Inject 0.3 mg into the muscle daily as needed (anaphylaxis).          . Fluticasone-Salmeterol (ADVAIR DISKUS) 250-50 MCG/DOSE AEPB   Inhalation   Inhale 1 puff into the lungs 2 (two) times daily. Rinse mouth   60 each   prn   . nicotine (NICODERM CQ - DOSED IN MG/24 HOURS) 21 mg/24hr patch   Transdermal   Place 1 patch onto the skin daily.   28 patch   0   . omalizumab (XOLAIR) 150 MG injection   Subcutaneous   Inject 225 mg into the skin every 14 (fourteen) days.         [name] omeprazole (PRILOSEC) 20 MG capsule   Oral   Take 1 capsule (20 mg total) by mouth 2 (two) times daily before a meal.   30 capsule   12   . predniSONE (DELTASONE) 20 MG tablet      Take 40mg  for 3  days then 20 mg until finished   10 tablet   1    BP 134/78  Pulse 92  Temp(Src) 98.4 F (36.9 C) (Oral)  Resp 24  SpO2 96%  LMP 10/04/2012 Physical Exam  ED Course  Procedures (including critical care time) Labs Reviewed  CBC - Abnormal; Notable for the following:    WBC 24.8 (*)    Hemoglobin 11.8 (*)    HCT 35.5 (*)    RDW 16.1 (*)    All other components within normal limits  POCT I-STAT 3, BLOOD GAS (G3P V) - Abnormal; Notable for the following:    pH, Ven 7.310 (*)    pCO2, Ven 53.7 (*)    pO2, Ven 24.0 (*)    Bicarbonate 27.0 (*)    All other components within normal limits   Dg Chest 2 View  10/21/2012   *RADIOLOGY REPORT*  Clinical Data: , cough, shortness of breath.  CHEST - 2 VIEW  Comparison: 10/07/2012  Findings: Mild peribronchial thickening.  Heart is normal size.  No confluent opacity or effusion.  No acute bony abnormality.  No change since prior study.  IMPRESSION: Stable mild bronchitic changes.   Original Report Authenticated By: Charlett Nose, M.D.   1.  Asthma attack   2. Tobacco abuse   3. Status asthmaticus, moderate persistent   4. Hyperglycemia   5. Diabetes mellitus     MDM  Patient with status asthmaticus. Inadequate response to Albuterol and Atrovent. Found to have acute respiratory acidosis. Starting on BiPAP. Will continue B agonist tx and tx with Mg as well. Case discussed with teaching service who will see and admit.   CRITICAL CARE Performed by: Brandt Loosen   Total critical care time: 72m  Critical care time was exclusive of separately billable procedures and treating other patients.  Critical care was necessary to treat or prevent imminent or life-threatening deterioration.  Critical care was time spent personally by me on the following activities: development of treatment plan with patient and/or surrogate as well as nursing, discussions with consultants, evaluation of patient's response to treatment, examination of patient, obtaining history from patient or surrogate, ordering and performing treatments and interventions, ordering and review of laboratory studies, ordering and review of radiographic studies, pulse oximetry and re-evaluation of patient's condition.   Brandt Loosen, MD 10/21/12 [phone]

## 2012-10-21 NOTE — Telephone Encounter (Signed)
Noted  

## 2012-10-21 NOTE — ED Notes (Signed)
**Note De-Identified  Obfuscation** RT note: patient removed from BIPAP to RA SAT 99%, BBS clr/dim. RT to cont, to monitor.

## 2012-10-21 NOTE — Telephone Encounter (Signed)
April [name] called this afternoon wanting to know if she could get her xolair shot.(pt.in hosp.) She's actually due for her shots tomorrow. I went and ask Dr.Young if she could get her xolair shot once she is able? "Yes." I called April [name] back and told her yes she could get her shots once she gets out of the hosp.[name] She wanted to get it at the hosp.. I explained to her it's not as simple as me coming over to the hosp. And giving you your shots.(Like she suggested.) I called [name] and told her about it ,she backed me up and said no she would have to come here for her xolair shots.

## 2012-10-21 NOTE — ED Notes (Signed)
Attempted report 

## 2012-10-21 NOTE — ED Notes (Signed)
PT. REPORTS PROGRESSING ASTHMA FOR SEVERAL DAYS WITH CHEST TIGHTNESS UNRELIEVED BY PRESCRIPTION MDI.

## 2012-10-21 NOTE — ED Provider Notes (Addendum)
History    CSN: [id number] Arrival date & time 10/21/12  0202  First MD Initiated Contact with Patient 10/21/12 [phone]     Chief Complaint  Patient presents with  . Asthma   (Consider location/radiation/quality/duration/timing/severity/associated sxs/prior Treatment) HPI April [name] is a 42 year old woman with a history of asthma who reports multiple previous admissions for asthma exacerbation. She presents with one week of wheezing. She has developed some shortness of breath. She has been using albuterol nebulized and inhaled treatments at home without much benefit. Her symptoms worsened this morning to the point she felt she needed to be evaluated and treated in the emergency department.  The patient has a mild and nonproductive cough. She denies runny nose, fever. The patient smokes one to 2 cigarettes per day.  Of note, the patient says that she has never had an ICU admission. Past Medical History  Diagnosis Date  . Acanthosis nigricans   . Menorrhagia   . Hypertension, essential   . Insomnia   . Morbid obesity   . Helicobacter pylori (H. pylori) infection   . Sleep apnea     Sleep study 2008 - mild OSA, not enough events to titrate CPAP  . COPD (chronic obstructive pulmonary disease)     PFTs in 2002, FEV1/FVC 65, no post bronchodilater test done  . Asthma     Followed by Dr. Maple Hudson (pulmonology); receives every other week omalizumab injections; has frequent exacerbations  . Depression   . GERD (gastroesophageal reflux disease)   . Tobacco user    Past Surgical History  Procedure Laterality Date  . Tubal ligation  1996    bilateral  . Breast reduction surgery  09/2011   Family History  Problem Relation Age of Onset  . Asthma Daughter   . Cancer Paternal Aunt   . Asthma Maternal Grandmother   . Hypertension Mother    History  Substance Use Topics  . Smoking status: Former Smoker    Types: Cigarettes    Quit date: 06/26/2007  . Smokeless tobacco: Never Used  .  Alcohol Use: No   OB History   Grav Para Term Preterm Abortions TAB SAB Ect Mult Living                 Review of Systems Gen: no weight loss, fevers, chills, night sweats Eyes: no discharge or drainage, no occular pain or visual changes Nose: no epistaxis or rhinorrhea Mouth: no dental pain, no sore throat Neck: no neck pain Lungs: As per history of present illness, otherwise negative CV: no chest pain, palpitations, dependent edema or orthopnea Abd: no abdominal pain, nausea, vomiting GU: no dysuria or gross hematuria MSK: no myalgias or arthralgias Neuro: no headache, no focal neurologic deficits Skin: no rash Psyche: negative.  Allergies  Ace inhibitors; Percocet; and Zolpidem tartrate  Home Medications   Current Outpatient Rx  Name  Route  Sig  Dispense  Refill  . albuterol (PROVENTIL HFA;VENTOLIN HFA) 108 (90 BASE) MCG/ACT inhaler   Inhalation   Inhale 2 puffs into the lungs every 4 (four) hours as needed for wheezing or shortness of breath.         [name] albuterol (PROVENTIL HFA;VENTOLIN HFA) 108 (90 BASE) MCG/ACT inhaler   Inhalation   Inhale 2 puffs into the lungs every 4 (four) hours as needed for wheezing or shortness of breath. For wheezing, shortness of breath.   1 Inhaler   prn   . albuterol (PROVENTIL) (2.5 MG/3ML) 0.083% nebulizer solution  Nebulization   Take 2.5 mg by nebulization every 4 (four) hours as needed for wheezing or shortness of breath.          . clonazePAM (KLONOPIN) 0.5 MG tablet   Oral   Take 0.5-1 mg by mouth at bedtime as needed (sleep).         . EPINEPHrine (EPIPEN) 0.3 mg/0.3 mL DEVI   Intramuscular   Inject 0.3 mg into the muscle daily as needed (anaphylaxis).          . Fluticasone-Salmeterol (ADVAIR DISKUS) 250-50 MCG/DOSE AEPB   Inhalation   Inhale 1 puff into the lungs 2 (two) times daily. Rinse mouth   60 each   prn   . nicotine (NICODERM CQ - DOSED IN MG/24 HOURS) 21 mg/24hr patch   Transdermal   Place 1  patch onto the skin daily.   28 patch   0   . omalizumab (XOLAIR) 150 MG injection   Subcutaneous   Inject 225 mg into the skin every 14 (fourteen) days.         [name] omeprazole (PRILOSEC OTC) 20 MG tablet   Oral   Take 20-40 mg by mouth 2 (two) times daily.         [name] omeprazole (PRILOSEC) 20 MG capsule   Oral   Take 1 capsule (20 mg total) by mouth 2 (two) times daily before a meal.   30 capsule   12   . predniSONE (DELTASONE) 20 MG tablet      Take 40mg  for 3 days then 20 mg until finished   10 tablet   1    BP 146/64  Pulse 107  Temp(Src) 98.4 F (36.9 C) (Oral)  Resp 21  SpO2 100%  LMP 10/04/2012 Physical Exam Gen: well developed and well nourished appearing Head: NCAT Eyes: PERL, EOMI Nose: no epistaixis or rhinorrhea Mouth/throat: mucosa is moist and pink Neck: supple, no stridor Lungs: Oxygen saturations 100%, respiratory rate 24 per minute,  diminished air movement bilaterally, faint wheezing appreciated, no accessory muscle use or retractions. rhonchi or rales CV: RRR, no murmur Abd: soft, notender, nondistended Back: no ttp, no cva ttp Skin: no rashese, wnl Neuro: CN ii-xii grossly intact, no focal deficits Psyche; normal affect,  calm and cooperative.   ED Course  Procedures (including critical care time) Labs Reviewed - No data to display Dg Chest 2 View  10/21/2012   *RADIOLOGY REPORT*  Clinical Data: , cough, shortness of breath.  CHEST - 2 VIEW  Comparison: 10/07/2012  Findings: Mild peribronchial thickening.  Heart is normal size.  No confluent opacity or effusion.  No acute bony abnormality.  No change since prior study.  IMPRESSION: Stable mild bronchitic changes.   Original Report Authenticated By: Charlett Nose, M.D.   No diagnosis found.  No results found for this or any previous visit (from the past 24 hour(s)).  No results found for this or any previous visit (from the past 24 hour(s)).   MDM  Patient with acute asthma excacerbation.  No signs of infiltrate on CXR. We are treating with prednisone and continous albuterol and atrovent nebulized and will re-evaluate for disposition.   0600 - patient re-examined. Says she does not feel much better. Moving slightly more air but over all still very bronchospastic. Will re-evaluate after 10mg  albuterol and 1mg  atrovent neb is completed.   9629 - patient says she still feel SOB and is having a hard time moving air. Still has wheezing although her air exchange  is now pretty good.  Her 02 sats drop into the mid to upper 80s when she sits up. I have paged IM teaching service to request admission.   Brandt Loosen, MD 10/21/12 1610  Brandt Loosen, MD 10/21/12 [phone]

## 2012-10-21 NOTE — H&P (Signed)
Date: 10/21/2012               Patient Name:  April [name] MRN: [id number]  DOB: 1971/01/29 Age / Sex: 42 y.o., female   PCP: April Dad, MD         Medical Service: Internal Medicine Teaching Service         Attending Physician: Dr. Brandt Loosen, MD    First Contact: April [name] Pager: [phone]  Second Contact: April [name] Pager: [phone]       After Hours (After 5p/  First Contact Pager: [phone]  weekends / holidays): Second Contact Pager: [phone]   Chief Complaint: shortness of breath  History of Present Illness: April [name] is a 42 year old woman with a PMH significant for extrinsic asthma (followed by April [name]), frequent asthma exacerbations, GERD, allergic rhinitis, OSA, HTN, and morbid obesity that presents to the [facility] ED complaining of worsening shortness of breath and cough for the last week.  She states that this has been building over the last week with coughing at night, worsening shortness of breath and DOE.  She states that she has been taking her medications and reports that she has been taking her Dulera sometimes 3-4 times daily as well as some left over symbicort.  She has been using her rescue inhaler and her nebulizers much more often as well.  She also gets Xolair injections every 2 weeks with the last dose on 6/12.  She denies fevers, chills, nausea, vomiting, chest pain, or abdominal pain.  She does state that she has had worsening of her rhinitis with post nasal drip.  She hasn't used anything to help it.  She also notes that she has been taking her omeprazole 20 mg once or twice daily but does miss days.  "I only take it when I'm eating something that I know will give me heart burn."    Meds: Current Outpatient Prescriptions  Medication Sig Dispense Refill  . albuterol (PROVENTIL HFA;VENTOLIN HFA) 108 (90 BASE) MCG/ACT inhaler Inhale 2 puffs into the lungs every 4 (four) hours as needed for wheezing or shortness of breath.      [name] albuterol (PROVENTIL)  (2.5 MG/3ML) 0.083% nebulizer solution Take 2.5 mg by nebulization every 4 (four) hours as needed for wheezing or shortness of breath.       . clonazePAM (KLONOPIN) 0.5 MG tablet Take 0.5-1 mg by mouth at bedtime as needed (sleep).      . EPINEPHrine (EPIPEN) 0.3 mg/0.3 mL DEVI Inject 0.3 mg into the muscle daily as needed (anaphylaxis).       . Fluticasone-Salmeterol (ADVAIR DISKUS) 250-50 MCG/DOSE AEPB Inhale 1 puff into the lungs 2 (two) times daily. Rinse mouth  60 each  prn  . nicotine (NICODERM CQ - DOSED IN MG/24 HOURS) 21 mg/24hr patch Place 1 patch onto the skin daily.  28 patch  0  . omalizumab (XOLAIR) 150 MG injection Inject 225 mg into the skin every 14 (fourteen) days.      [name] omeprazole (PRILOSEC) 20 MG capsule Take 1 capsule (20 mg total) by mouth 2 (two) times daily before a meal.  30 capsule  12  . predniSONE (DELTASONE) 20 MG tablet Take 40mg  for 3 days then 20 mg until finished  10 tablet  1  . [DISCONTINUED] albuterol (PROVENTIL HFA;VENTOLIN HFA) 108 (90 BASE) MCG/ACT inhaler Inhale 1-2 puffs into the lungs every 6 (six) hours as needed for wheezing.  1 Inhaler  0  Allergies: Allergies as of 10/21/2012 - Review Complete 10/21/2012  Allergen Reaction Noted  . Ace inhibitors  11/24/2009  . Percocet (oxycodone-acetaminophen) Itching 01/13/2012  . Zolpidem tartrate  05/28/2011   Past Medical History  Diagnosis Date  . Acanthosis nigricans   . Menorrhagia   . Hypertension, essential   . Insomnia   . Morbid obesity   . Helicobacter pylori (H. pylori) infection   . Sleep apnea     Sleep study 2008 - mild OSA, not enough events to titrate CPAP  . COPD (chronic obstructive pulmonary disease)     PFTs in 2002, FEV1/FVC 65, no post bronchodilater test done  . Asthma     Followed by April [name] (pulmonology); receives every other week omalizumab injections; has frequent exacerbations  . Depression   . GERD (gastroesophageal reflux disease)   . Tobacco user    Past Surgical  History  Procedure Laterality Date  . Tubal ligation  1996    bilateral  . Breast reduction surgery  09/2011   Family History  Problem Relation Age of Onset  . Asthma Daughter   . Cancer Paternal Aunt   . Asthma Maternal Grandmother   . Hypertension Mother    History   Social History  . Marital Status: Divorced    Spouse Name: N/A    Number of Children: 1  . 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 Smoking status: Current Some Day Smoker    Types: Cigarettes  . Smokeless tobacco: Never Used     Comment: 2-3 cigarettes per day, second hand smoke at home  . Alcohol Use: No  . Drug Use: No  . Sexually Active: Not on file   Other Topics Concern  . 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    Review of Systems: Constitutional: Denies fever, chills, diaphoresis, appetite change and fatigue.  HEENT: Positive for rhinorrhea.  Denies photophobia, eye pain, redness, hearing loss, ear pain, congestion, sore throat, sneezing, mouth sores, trouble swallowing, neck pain, neck stiffness and tinnitus.   Respiratory: Positive for SOB, DOE, cough, chest tightness,  and wheezing.   Cardiovascular: Denies chest pain, palpitations and leg swelling.  Gastrointestinal: Denies nausea, vomiting, abdominal pain, diarrhea, constipation, blood in stool and abdominal distention.  Genitourinary: Denies dysuria, urgency, frequency, hematuria, flank pain and difficulty urinating.  Endocrine: Denies: hot or cold intolerance, sweats, changes in hair or nails, polyuria, polydipsia. Musculoskeletal: Denies myalgias, back pain, joint swelling, arthralgias and gait problem.  Skin: Denies pallor, rash and wound.  Neurological: Denies dizziness, seizures, syncope, weakness, light-headedness, numbness and headaches.   Hematological: Denies adenopathy. Easy bruising, personal or family bleeding history  Psychiatric/Behavioral: Denies suicidal ideation, mood changes, confusion, nervousness, sleep disturbance and agitation  Physical Exam: Blood pressure 152/86, pulse 89, temperature 98.4 F (36.9 C), temperature source Oral, resp. rate 21, last menstrual period 10/04/2012, SpO2 97.00%. Constitutional: Vital signs reviewed.  Patient is a well-developed and well-nourished morbidly obese woman in mild respiratory distress and cooperative with exam. Alert and oriented x3.  Head: Normocephalic and atraumatic Ear: TM normal bilaterally Nose: mild erythema, and clear drainage noted.  Turbinates boggy Mouth: mild posterior pharynx erythema.  no exudates, MMM Eyes: PERRL, EOMI, conjunctivae normal, No scleral icterus.  Neck: Supple, Trachea midline normal  ROM, No JVD, mass, thyromegaly, or carotid bruit present.  Cardiovascular: RRR, S1 normal, S2 normal, no MRG, pulses symmetric and intact bilaterally Pulmonary/Chest: Prolonged expiratory phases with diffuse end expiratory wheezes noted in all lung fields.  No Rhonchi noted.  Abdominal: Soft. Non-tender, non-distended, bowel sounds are normal, no masses, organomegaly, or guarding present.  GU: no CVA tenderness Musculoskeletal: No joint deformities, erythema, or stiffness, ROM full and no nontender Hematology: no cervical, inginal, or axillary adenopathy.  Neurological: A&O x3, Strength is normal and symmetric bilaterally, cranial nerve II-XII are grossly intact, no focal motor deficit, sensory intact to light touch bilaterally.  Skin: Warm, dry and intact. No rash, cyanosis, or clubbing.  Psychiatric: anxious mood and flat affect. speech and behavior is normal. Judgment and thought content normal. Cognition and memory are normal.   Lab results: Basic Metabolic Panel: Pending  CBC:  Recent Labs  10/21/12 0725  WBC 24.8*  HGB 11.8*  HCT 35.5*  MCV 84.9    PLT 328   Drugs of Abuse  Pending   Misc. Labs: Venous Blood gas: PH: 7.310 PCO2: 53.7 PO2: 24.0 Bicarb: 27.0  Imaging results:  Dg Chest 2 View  10/21/2012   *RADIOLOGY REPORT*  Clinical Data: , cough, shortness of breath.  CHEST - 2 VIEW  Comparison: 10/07/2012  Findings: Mild peribronchial thickening.  Heart is normal size.  No confluent opacity or effusion.  No acute bony abnormality.  No change since prior study.  IMPRESSION: Stable mild bronchitic changes.   Original Report Authenticated By: Charlett Nose, M.D.   Other results: EKG: sinus tachycardia, T wave inversions in V5 and V6 which is unchanged from 04/2012.  Inverted T wave in V4 in 04/2012 is now upright.  Q wave in aVL that appears new.  No ST segment changes and good R wave progression.  Assessment & Plan by Problem: Ms. Darcey is a 42 year old woman who presents to the [facility] ED in an acute Asthma exacerbation.  1.  Acute Asthma Exacerbation:  Ms. Helbig presents today in an asthma exacerbation.  On presentation she was hypoxic when she sat up or tried to move at all.  On my evaluation she was saturating >90% while sitting up but with any activity she dipped into the upper 80s of saturation with diffuse expiratory wheezes and mild respiratory distress.  She states that she has been smoking 2-3 cigarettes per day and is exposed to cigarette smoke at home but feels that didn't cause her exacerbation.  She also notes more post nasal drip and has not been taking an anti-histamine.  She has also not been controlling her reflux well either.  She does report that she has been compliant with her breathing medications and actually taking it more often then prescribed.  Chest x-ray is negative for infiltrate.   - Admit to SDU  - BiPAP PRN for work of breathing  - Prednisone 60 mg daily (recieved today's dose in the ED)  - Duonebs Q6 scheduled and Q4 PRN  - Magnesium 4 g dose  - Protonix 40 mg BID  - Benadryl for post nasal drip  -  Continue Klonopin for anxiety and breathlessness  - Bmet to assess potassium level with high doses of albuterol  2.  Leukocytosis:  WBC count was elevated at 24.8 today which was mildly increased from the last count we have in the system.  She is afebrile and has no focal signs of infection so this is likely secondary to her overuse  of her Dulera and prednisone she received in the ED.    - Monitor fever curve  - Recheck in the AM  3.  Hypertension: Blood pressure on arrival was mildly elevated at 177/101.  She is not currently on medication for hypertension.  Likely elevated with respiratory distress.  - Monitor BP and consider addition of blood pressure medication such as HCTZ  4.  Chronic Allergic rhinitis: She has a history of atopy and receives Xolair injections for her allergic asthma.    - Add Anti-histamine to help control post nasal drip.  Suggest Chlorpheniramine as an outpatient BID  5.  Esophageal reflux: She states that she has been taking her omeprazole 20 mg only when she eats a meal that she knows will cause heart burn.  She may have silent reflux that is exacerbating her asthma symptoms at baseline.    - Counsel patient to take 20 mg BID every day, 30 minutes prior to her largest meal and at bedtime  - Protonix BID while in the hospital.  6.  VTE: Lovenox  Dispo: Disposition is deferred at this time, awaiting improvement of current medical problems. Anticipated discharge in approximately 3-4 day(s).   The patient does have a current PCP April Dad, MD) and does need an [facility] hospital follow-up appointment after discharge.  The patient does not have transportation limitations that hinder transportation to clinic appointments.  Signed: Leodis Sias, MD 10/21/2012, 9:03 AM

## 2012-10-21 NOTE — ED Notes (Signed)
While ambulating, pt's O2 sats remained between 95% and 98%. Pt stated that she experienced dyspnea and dizziness while ambulating.

## 2012-10-21 NOTE — Progress Notes (Signed)
Placed pt. On CPAP auto titrate min: 5, max:16 via FFM with 4L O2 bled in. (pt. Refuses to wear nasal mask) Pt. Is tolerating CPAP well at this time.

## 2012-10-22 ENCOUNTER — Ambulatory Visit: Payer: Commercial Managed Care - PPO | Admitting: Internal Medicine

## 2012-10-22 ENCOUNTER — Ambulatory Visit: Payer: Commercial Managed Care - PPO

## 2012-10-22 LAB — BASIC METABOLIC PANEL
CO2: 25 mEq/L (ref 19–32)
CO2: 23 mEq/L (ref 19–32)
Calcium: 8.1 mg/dL — ABNORMAL LOW (ref 8.4–10.5)
Chloride: 104 mEq/L (ref 96–112)
Creatinine, Ser: 0.72 mg/dL (ref 0.50–1.10)
Glucose, Bld: 104 mg/dL — ABNORMAL HIGH (ref 70–99)
Glucose, Bld: 152 mg/dL — ABNORMAL HIGH (ref 70–99)
Sodium: 137 mEq/L (ref 135–145)

## 2012-10-22 LAB — CBC
Hemoglobin: 12.3 g/dL (ref 12.0–15.0)
MCH: 27.6 pg (ref 26.0–34.0)
MCV: 86.8 fL (ref 78.0–100.0)
Platelets: 341 10*3/uL (ref 150–400)
RBC: 4.46 MIL/uL (ref 3.87–5.11)

## 2012-10-22 MED ORDER — HYDROCHLOROTHIAZIDE 25 MG PO TABS
25.0000 mg | ORAL_TABLET | Freq: Every day | ORAL | Status: DC
  Administered 2012-10-22 – 2012-10-23 (×2): 25 mg via ORAL
  Filled 2012-10-22 (×2): qty 1

## 2012-10-22 MED ORDER — ALBUTEROL SULFATE (5 MG/ML) 0.5% IN NEBU
2.5000 mg | INHALATION_SOLUTION | Freq: Four times a day (QID) | RESPIRATORY_TRACT | Status: DC
  Administered 2012-10-22 – 2012-10-23 (×3): 2.5 mg via RESPIRATORY_TRACT
  Filled 2012-10-22 (×3): qty 0.5

## 2012-10-22 MED ORDER — TRAMADOL HCL 50 MG PO TABS
50.0000 mg | ORAL_TABLET | Freq: Once | ORAL | Status: AC
  Administered 2012-10-22: 50 mg via ORAL
  Filled 2012-10-22 (×2): qty 1

## 2012-10-22 MED ORDER — PANTOPRAZOLE SODIUM 40 MG PO TBEC
40.0000 mg | DELAYED_RELEASE_TABLET | Freq: Two times a day (BID) | ORAL | Status: DC
  Administered 2012-10-22 – 2012-10-23 (×2): 40 mg via ORAL
  Filled 2012-10-22 (×2): qty 1

## 2012-10-22 MED ORDER — IPRATROPIUM BROMIDE 0.02 % IN SOLN
0.5000 mg | Freq: Four times a day (QID) | RESPIRATORY_TRACT | Status: DC
  Administered 2012-10-22 – 2012-10-23 (×3): 0.5 mg via RESPIRATORY_TRACT
  Filled 2012-10-22 (×3): qty 2.5

## 2012-10-22 MED ORDER — ENOXAPARIN SODIUM 80 MG/0.8ML ~~LOC~~ SOLN
80.0000 mg | SUBCUTANEOUS | Status: DC
  Administered 2012-10-23: 80 mg via SUBCUTANEOUS
  Filled 2012-10-22: qty 0.8

## 2012-10-22 NOTE — Progress Notes (Signed)
Pharmacy note: lovenox  42 yo female on lovenox for DVT prophylaxis. Patient noted with signficant obesity  -Wt= 170kg -SCr= 0.72 and CrCl > 100 -Hg/Hct= 11.8/35.5 and stable  Plan -Due to significant obesity will adjust lovenox to 80mg  sq q24hr (0.5mg /kg/day) -CBC every 3 days and will follow patient progress  Harland German, Pharm D 10/22/2012 11:37 AM

## 2012-10-22 NOTE — Progress Notes (Signed)
Subjective: Pt states that she is feeling better since admission. Initially requiring Bipap on admission, but since has only required nasal canula. Requesting to eat and requesting tx out of SDU.   Objective: Vital signs in last 24 hours: Filed Vitals:   10/22/12 0422 10/22/12 0601 10/22/12 0739 10/22/12 0800  BP: 150/79  150/79 144/79  Pulse: 82  78 87  Temp: 98.3 F (36.8 C)   97.3 F (36.3 C)  TempSrc: Axillary   Oral  Resp: 19  14 15   Height:      Weight: 374 lb 12.5 oz (170 kg)     SpO2: 99% 100% 100% 100%   Weight change:   Intake/Output Summary (Last 24 hours) at 10/22/12 1033 Last data filed at 10/22/12 0700  Gross per 24 hour  Intake   2100 ml  Output      0 ml  Net   2100 ml   Vitals reviewed. General: Sitting up in bed, NAD HEENT: PERRL, EOMI, no scleral icterus Cardiac: Tachycardic, no rubs, murmurs or gallops Pulm: Clear to auscultation bilaterally, mild increased work of breathing Abd: Obese, soft, nontender, nondistended, BS present Ext: Warm and well perfused Neuro: Alert and oriented X3, cranial nerves II-XII grossly intact, strength and sensation equal in bilateral upper and lower extremities  Lab Results: Basic Metabolic Panel:  Recent Labs Lab 10/21/12 0725 10/22/12 0500  NA 136 135  K 4.1 5.6*  CL 99 104  CO2 26 25  GLUCOSE 141* 104*  BUN 10 9  CREATININE 0.78 0.72  CALCIUM 8.6 7.9*  MG 2.0  --    Liver Function Tests:  Recent Labs Lab 10/21/12 0725  AST 15  ALT 15  ALKPHOS 89  BILITOT 0.1*  PROT 6.7  ALBUMIN 2.9*   CBC:  Recent Labs Lab 10/21/12 0725  WBC 24.8*  HGB 11.8*  HCT 35.5*  MCV 84.9  PLT 328    Micro Results: Recent Results (from the past 240 hour(s))  MRSA PCR SCREENING     Status: None   Collection Time    10/21/12 10:38 AM      Result Value Range Status   MRSA by PCR NEGATIVE  NEGATIVE Final   Comment:            The GeneXpert MRSA Assay (FDA     approved for NASAL specimens     only), is one  component of a     comprehensive MRSA colonization     surveillance program. It is not     intended to diagnose MRSA     infection nor to guide or     monitor treatment for     MRSA infections.   Studies/Results: Dg Chest 2 View  10/21/2012   *RADIOLOGY REPORT*  Clinical Data: , cough, shortness of breath.  CHEST - 2 VIEW  Comparison: 10/07/2012  Findings: Mild peribronchial thickening.  Heart is normal size.  No confluent opacity or effusion.  No acute bony abnormality.  No change since prior study.  IMPRESSION: Stable mild bronchitic changes.   Original Report Authenticated By: Charlett Nose, M.D.   Medications: I have reviewed the patient's current medications. Scheduled Meds: . albuterol  2.5 mg Nebulization Q6H   And  . ipratropium  0.5 mg Nebulization Q6H  . enoxaparin (LOVENOX) injection  40 mg Subcutaneous Q24H  . nicotine  14 mg Transdermal Daily  . pantoprazole (PROTONIX) IV  40 mg Intravenous Q12H  . predniSONE  60 mg Oral Q breakfast  .  sodium chloride  3 mL Intravenous Q12H   Continuous Infusions: . sodium chloride 100 mL/hr at 10/22/12 0734   PRN Meds:.acetaminophen, acetaminophen, albuterol, clonazePAM, diphenhydrAMINE, ipratropium, ondansetron (ZOFRAN) IV, ondansetron  Assessment/Plan:  Ms. Lahmann is a 42 year old woman who presents to the [facility] ED in an acute Asthma exacerbation.   1. Acute Asthma Exacerbation: Ms. Holck presented with an asthma exacerbation. On presentation she was hypoxic when she sat up or tried to move at all. On evaluation she was saturating >90% while sitting up but with any activity she dropped into the upper 80s of saturation with diffuse expiratory wheezes and mild respiratory distress. She states that she has been smoking 2-3 cigarettes per day and is exposed to cigarette smoke at home. She also notes more post nasal drainage and has not been taking an anti-histamine; in addition, she has also not been controlling her reflux well either. She did  report compliance with her breathing medications and was actually taking it more often then prescribed. Chest x-ray was negative for infiltrate. On admission, she was in respiratory distress and required bipap. She was also started on Prednisone and scheduled Duoneb therapy. Her respiratory status improved, and she was tolerating nasal canula and is now on room air. I spoke with Dr. Maple Hudson, her outpateint pulmonologist, who states that the pt is very noncompliant with her medications and has anxiety and uncontrolled reflux which cause her asthma to not be well controlled. He recommended low dose steroids at 10-15mg  po daily for a few weeks. He also felt that she would be a good [facility] patient. Will consult CM for this. - Prednisone 60 mg daily - Duonebs Q6 scheduled and Q4 PRN  - Protonix 40 mg BID  - Benadryl for post nasal drip  - Continue Klonopin for anxiety and breathlessness  - CM consult for [facility]  2. Leukocytosis: WBC count was elevated at 24.8 today which was mildly increased from the last count we have in the system. She is afebrile and has no focal signs of infection so this is likely secondary to her overuse of her Dulera and prednisone she received in the ED. Pt afebrile. - Monitor fever curve  - Recheck CBC now  3. Hypertension: Blood pressure on arrival was mildly elevated to 177/101. She is not currently on medication for hypertension. Likely elevated with respiratory distress. BP improved but still elevated.  - Start HCTZ   4. Chronic Allergic rhinitis: She has a history of atopy and receives Xolair injections for her allergic asthma.  - Benadryl to help control post nasal drainage. Suggest Chlorpheniramine as an outpatient BID   5. Esophageal reflux: She states that she has been taking her omeprazole 20 mg only when she eats a meal that she knows will cause heart burn. She may have silent reflux that is exacerbating her asthma symptoms at baseline.  - Counsel patient to take 20 mg BID  every day, 30 minutes prior to her largest meal and at bedtime  - Protonix BID while in the hospital.   6. DVT PPx: Lovenox   Dispo: Disposition is deferred at this time, awaiting improvement of current medical problems.  Anticipated discharge in approximately 1 day(s).   The patient does have a current PCP Mathis Dad, MD) and does need an [facility] hospital follow-up appointment after discharge.  The patient does not have transportation limitations that hinder transportation to clinic appointments.  .Services Needed at time of discharge: Y = Yes, Blank = No PT:  OT:   RN:   Equipment:   Other:     LOS: 1 day   Genelle Gather, MD 10/22/2012, 10:33 AM

## 2012-10-22 NOTE — Progress Notes (Signed)
Utilization review completed. Justus Droke, RN, BSN. 

## 2012-10-22 NOTE — Care Management Note (Signed)
    Page 1 of 1   10/22/2012     2:26:05 PM   CARE MANAGEMENT NOTE 10/22/2012  Patient:  April [name], April [name]   Account Number:  [id number]  Date Initiated:  10/22/2012  Documentation initiated by:  April [name]  Subjective/Objective Assessment:   adm w asthma     Action/Plan:   pt lives w fam, pcp dr Elon Jester ho   Anticipated DC Date:     Anticipated DC Plan:  HOME/SELF CARE      DC Planning Services  CM consult      Choice offered to / List presented to:             Status of service:   Medicare Important Message given?   (If response is "NO", the following Medicare IM given date fields will be blank) Date Medicare IM given:   Date Additional Medicare IM given:    Discharge Disposition:    Per UR Regulation:  Reviewed for med. necessity/level of care/duration of stay  If discussed at Long Length of Stay Meetings, dates discussed:    Comments:  6/26 1423p April Javante Nilsson rn,bsn spoke w pt. she is not elidg for [employer]. her [facility] program not in their network. pt has ins for meds. did print off inform from needymeds website for her to ck w drug comapnies for coupons and to see if drug company would assist w meds. hosp cannot fill meds because pt has ins that covers meds. her problem is copays are adding up.

## 2012-10-22 NOTE — Progress Notes (Signed)
Received referral for [facility] Care Management services for Link to Wellness program for employees and their dependents with [facility] insurance. Confirmed with patient via phone that she does not have [employer] UMR insurance but has UMR wIth [facility]. Explained to her that this is not an insurance that [facility] Care Management follows. Made inpatient RNCM aware as well.  Raiford Noble, MSN- Ed, [facility] [phone]

## 2012-10-22 NOTE — H&P (Addendum)
INTERNAL MEDICINE TEACHING SERVICE Attending Admission Note  Date: 10/22/2012  Patient name: April [name]  Medical record number: [id number]  Date of birth: 03-03-1971    I have seen and evaluated [name] and discussed their care with the Residency Team.  41 yr. Old AAF w/ hx Asthma followed by Dr. Maple Hudson, frequent asthmatic exacerbations, OSA, HTN, morbid obesity, HTN, presented with SOB and cough.  She originally stated that she had been taking her medications (dulera) more frequently, but tells me this morning that she ran out of them. She admits she continues to use tobacco. She was noted be in acute hypoxic/hypercarbic respiratory failure on admission requiring BiPAP. CXR without infiltrates.  She was recently on oral steroids which contributed to a leukocytosis. This morning she is doing much better. She has barely any wheezing on exam and is noted to be on room air, O2 sat 96% sitting in a chair. She was eating a hamburger and the same time as she was speaking with me without any conversational dyspnea.  We have notified her pulmonologist Dr. Maple Hudson. She will likely need steroid maintenance for a while as outpatient. She will need to follow up. She can come out of stepdown unit to a regular floor.  She is clinically stable. She can be monitored today and likely D/C home tomorrow. Educated on tobacco cessation.  Jonah Blue, DO 6/26/201411:45 AM

## 2012-10-23 ENCOUNTER — Ambulatory Visit (INDEPENDENT_AMBULATORY_CARE_PROVIDER_SITE_OTHER): Payer: Commercial Managed Care - PPO

## 2012-10-23 DIAGNOSIS — J45909 Unspecified asthma, uncomplicated: Secondary | ICD-10-CM

## 2012-10-23 LAB — BASIC METABOLIC PANEL
BUN: 9 mg/dL (ref 6–23)
Calcium: 8.3 mg/dL — ABNORMAL LOW (ref 8.4–10.5)
Creatinine, Ser: 0.73 mg/dL (ref 0.50–1.10)
GFR calc Af Amer: >90 mL/min (ref >90)
GFR calc non Af Amer: >90 mL/min (ref >90)

## 2012-10-23 LAB — CBC
HCT: 36.1 % (ref 36.0–46.0)
MCHC: 31.6 g/dL (ref 30.0–36.0)
MCV: 86.6 fL (ref 78.0–100.0)
RDW: 16.4 % — ABNORMAL HIGH (ref 11.5–15.5)

## 2012-10-23 MED ORDER — PREDNISONE 10 MG PO TABS: ORAL_TABLET | ORAL | Status: DC

## 2012-10-23 MED ORDER — MOMETASONE FURO-FORMOTEROL FUM 100-5 MCG/ACT IN AERO
2.0000 | INHALATION_SPRAY | Freq: Two times a day (BID) | RESPIRATORY_TRACT | Status: DC
  Filled 2012-10-23 (×2): qty 8.8

## 2012-10-23 MED ORDER — ALBUTEROL SULFATE HFA 108 (90 BASE) MCG/ACT IN AERS
2.0000 | INHALATION_SPRAY | Freq: Four times a day (QID) | RESPIRATORY_TRACT | Status: DC
  Filled 2012-10-23 (×2): qty 6.7

## 2012-10-23 MED ORDER — HYDROCHLOROTHIAZIDE 25 MG PO TABS: 25.0000 mg | ORAL_TABLET | Freq: Every day | ORAL | Status: DC

## 2012-10-23 MED ORDER — PREDNISONE 20 MG PO TABS
40.0000 mg | ORAL_TABLET | Freq: Every day | ORAL | Status: DC
  Filled 2012-10-23: qty 2

## 2012-10-23 NOTE — Progress Notes (Signed)
  Date: 10/23/2012  Patient name: SAMREET EDENFIELD  Medical record number: [id number]  Date of birth: [birth year]   This patient has been seen and the plan of care was discussed with the house staff. Please see their note for complete details. I concur with their findings with the following additions/corrections:  Feels well today. Her breathing is at baseline. Will need slow taper of prednisone to a maintenance dose per her pulmonologist, likely between 10-20 mg a daily. Stable for D/C today.   Jonah Blue, DO 10/23/2012, 12:52 PM

## 2012-10-23 NOTE — Progress Notes (Signed)
Pt complained of cpap pressure being too low. Adjusted cpap to auto titrate for pt comfort. Pt states that she is now comfortable. Rt will continue to monitor.

## 2012-10-23 NOTE — Progress Notes (Signed)
Pt discharged to home accompanied by family. Discharged instructions and rx given and explained and pt stated understanding. Pts IV was removed prior to leaving. Pt left unit in a stable condition via wheelchair.

## 2012-10-23 NOTE — Discharge Summary (Signed)
Name: April [name] MRN: [id number] DOB: 1970/05/05 42 y.o. PCP: Mathis Dad, MD  Date of Admission: 10/21/2012  4:12 AM Date of Discharge: 10/23/2012 Attending Physician: Jonah Blue, DO  Discharge Diagnosis: Principal Problem:   Acute asthma exacerbation Active Problems:   HYPERTENSION, ESSENTIAL NOS   Chronic allergic rhinitis   Esophageal reflux   Leukocytosis   Protein malnutrition  Discharge Medications:   Medication List    TAKE these medications       albuterol 108 (90 BASE) MCG/ACT inhaler  Commonly known as:  PROVENTIL HFA;VENTOLIN HFA  Inhale 2 puffs into the lungs every 4 (four) hours as needed for wheezing or shortness of breath.     albuterol (2.5 MG/3ML) 0.083% nebulizer solution  Commonly known as:  PROVENTIL  Take 2.5 mg by nebulization every 4 (four) hours as needed for wheezing or shortness of breath.     clonazePAM 0.5 MG tablet  Commonly known as:  KLONOPIN  Take 0.5-1 mg by mouth at bedtime as needed (sleep).     EPIPEN 0.3 mg/0.3 mL Devi  Generic drug:  EPINEPHrine  Inject 0.3 mg into the muscle daily as needed (anaphylaxis).     Fluticasone-Salmeterol 250-50 MCG/DOSE Aepb  Commonly known as:  ADVAIR DISKUS  Inhale 1 puff into the lungs 2 (two) times daily. Rinse mouth     hydrochlorothiazide 25 MG tablet  Commonly known as:  HYDRODIURIL  Take 1 tablet (25 mg total) by mouth daily.     nicotine 21 mg/24hr patch  Commonly known as:  NICODERM CQ - dosed in mg/24 hours  Place 1 patch onto the skin daily.     omalizumab 150 MG injection  Commonly known as:  XOLAIR  Inject 225 mg into the skin every 14 (fourteen) days.     omeprazole 20 MG capsule  Commonly known as:  PRILOSEC  Take 1 capsule (20 mg total) by mouth 2 (two) times daily before a meal.     predniSONE 10 MG tablet  Commonly known as:  DELTASONE  Take as directed, per discharge instructions        Disposition and follow-up:   April [name] was discharged from [facility] in Stable condition.  At the hospital follow up visit please address:  1.  Her respiratory status and her medication compliance. Please evaluate her BP, started HCTZ, but may need another agent.      She may need a different antihistamine; she is on Benadryl but this might be causing drowsiness.  2.  Labs / imaging needed at time of follow-up: CBC to evaluate improvement of her leukocytosis  3.  Pending labs/ test needing follow-up: None  Follow-up Appointments: Follow-up Information   Follow up with April Barrack, MD On 10/28/2012. (At 3pm)    Contact information:   [address] [name] [place] [address] [phone]       Follow up with [facility], NP On 11/09/2012. (At 9:30am)    Contact information:   [address] [place] [address] [phone]       Discharge Instructions: Discharge Orders   Future Appointments Provider Department Dept Phone   10/28/2012 3:00 PM April Barrack, MD [facility] [name] [phone]   11/09/2012 9:30 AM [name], NP [facility] [phone]   04/05/2013 2:00 PM [name], MD [facility] [phone]   Future Orders Complete By Expires     Call MD  for:  difficulty breathing, headache or visual disturbances  As directed     Call MD for:  extreme fatigue  As directed     Call MD for:  temperature >100.4  As directed     Diet - low sodium heart healthy  As directed     Discharge instructions  As directed     Comments:      Take 40mg  (4 tablets) of Prednisone for 2 days starting tomorrow, then Take 20mg  (2 tablets) for the next 2 days, then Take 10mg  (1 tablet) until you are seen by Dr. Maple Hudson in clinic.    Increase activity slowly  As directed        Consultations:  None  Procedures Performed:  Dg Chest 2 View  10/21/2012   *RADIOLOGY REPORT*  Clinical Data: , cough, shortness of breath.  CHEST - 2 VIEW  Comparison:  10/07/2012  Findings: Mild peribronchial thickening.  Heart is normal size.  No confluent opacity or effusion.  No acute bony abnormality.  No change since prior study.  IMPRESSION: Stable mild bronchitic changes.   Original Report Authenticated By: Charlett Nose, M.D.   Dg Chest 2 View  10/07/2012   *RADIOLOGY REPORT*  Clinical Data: Chest pain and shortness of breath.  CHEST - 2 VIEW  Comparison: 10/06/2012.  Findings: Trachea is midline.  Heart size normal.  Lungs are clear. No pleural fluid.  IMPRESSION: No acute findings.   Original Report Authenticated By: Leanna Battles, M.D.   Dg Chest 2 View (if Patient Has Fever And/or Copd)  10/06/2012   *RADIOLOGY REPORT*  Clinical Data: Chest tightness, wheezing, shortness of breath, cough and history of asthma.  CHEST - 2 VIEW  Comparison: 05/22/2012  Findings: Stable mild chronic bronchial thickening without focal infiltrate.  Lung volumes are normal.  No pneumothorax, edema or pleural fluid is identified.  The heart size and mediastinal contours are within normal limits.  No bony abnormalities are seen.  IMPRESSION: Chronic bronchial thickening without focal infiltrate.   Original Report Authenticated By: Irish Lack, M.D.    Admission  History of Present Illness: April [name] is a 42 year old woman with a PMH significant for extrinsic asthma (followed by Dr. Jetty Duhamel), frequent asthma exacerbations, GERD, allergic rhinitis, OSA, HTN, and morbid obesity that presents to the [name] ED complaining of worsening shortness of breath and cough for the last week. She states that this has been building over the last week with coughing at night, worsening shortness of breath and DOE. She states that she has been taking her medications and reports that she has been taking her Dulera sometimes 3-4 times daily as well as some left over symbicort. She has been using her rescue inhaler and her nebulizers much more often as well. She also gets Xolair injections every 2  weeks with the last dose on 6/12. She denies fevers, chills, nausea, vomiting, chest pain, or abdominal pain. She does state that she has had worsening of her rhinitis with post nasal drip. She hasn't used anything to help it. She also notes that she has been taking her omeprazole 20 mg once or twice daily but does miss days. "I only take it when I'm eating something that I know will give me heart burn."  Review of Systems:  Constitutional: Denies fever, chills, diaphoresis, appetite change and fatigue.  HEENT: Positive for rhinorrhea. Denies photophobia, eye pain, redness, hearing loss, ear pain, congestion, sore throat, sneezing, mouth sores, trouble swallowing, neck pain, neck stiffness  and tinnitus.  Respiratory: Positive for SOB, DOE, cough, chest tightness, and wheezing.  Cardiovascular: Denies chest pain, palpitations and leg swelling.  Gastrointestinal: Denies nausea, vomiting, abdominal pain, diarrhea, constipation, blood in stool and abdominal distention.  Genitourinary: Denies dysuria, urgency, frequency, hematuria, flank pain and difficulty urinating.  Endocrine: Denies: hot or cold intolerance, sweats, changes in hair or nails, polyuria, polydipsia.  Musculoskeletal: Denies myalgias, back pain, joint swelling, arthralgias and gait problem.  Skin: Denies pallor, rash and wound.  Neurological: Denies dizziness, seizures, syncope, weakness, light-headedness, numbness and headaches.  Hematological: Denies adenopathy. Easy bruising, personal or family bleeding history  Psychiatric/Behavioral: Denies suicidal ideation, mood changes, confusion, nervousness, sleep disturbance and agitation  Physical Exam:  Blood pressure 152/86, pulse 89, temperature 98.4 F (36.9 C), temperature source Oral, resp. rate 21, last menstrual period 10/04/2012, SpO2 97.00%.  Constitutional: Vital signs reviewed. Patient is a well-developed and well-nourished morbidly obese woman in mild respiratory distress and  cooperative with exam. Alert and oriented x3.  Head: Normocephalic and atraumatic  Ear: TM normal bilaterally  Nose: mild erythema, and clear drainage noted. Turbinates boggy  Mouth: mild posterior pharynx erythema. no exudates, MMM  Eyes: PERRL, EOMI, conjunctivae normal, No scleral icterus.  Neck: Supple, Trachea midline normal ROM, No JVD, mass, thyromegaly, or carotid bruit present.  Cardiovascular: RRR, S1 normal, S2 normal, no MRG, pulses symmetric and intact bilaterally  Pulmonary/Chest: Prolonged expiratory phases with diffuse end expiratory wheezes noted in all lung fields. No Rhonchi noted.  Abdominal: Soft. Non-tender, non-distended, bowel sounds are normal, no masses, organomegaly, or guarding present.  GU: no CVA tenderness Musculoskeletal: No joint deformities, erythema, or stiffness, ROM full and no nontender Hematology: no cervical, inginal, or axillary adenopathy.  Neurological: A&O x3, Strength is normal and symmetric bilaterally, cranial nerve II-XII are grossly intact, no focal motor deficit, sensory intact to light touch bilaterally.  Skin: Warm, dry and intact. No rash, cyanosis, or clubbing.  Psychiatric: anxious mood and flat affect. speech and behavior is normal. Judgment and thought content normal. Cognition and memory are normal.    Hospital Course by problem list: Ms. Chaudoin is a 42 year old woman who presents to the [facility] ED in an acute Asthma exacerbation.   1. Acute Asthma Exacerbation: Ms. Graffam presented with an asthma exacerbation. On presentation she was hypoxic when she sat up or tried to move at all. On evaluation she was saturating >90% while sitting up but with any activity she dropped into the upper 80s of saturation with diffuse expiratory wheezes and mild respiratory distress. She states that she has been smoking 2-3 cigarettes per day and is exposed to cigarette smoke at home. She also notes more post nasal drainage and has not been taking an  anti-histamine; in addition, she has also not been controlling her reflux well either. She did report compliance with her breathing medications and was actually taking it more often then prescribed. Chest x-ray was negative for infiltrate. On admission, she was in respiratory distress and required bipap. She was also started on Prednisone and scheduled Duoneb therapy. Her respiratory status improved, and she was tolerating nasal canula and is now on room air. I spoke with Dr. Maple [place], her outpateint pulmonologist, who states that the pt is very noncompliant with her medications and has anxiety and uncontrolled reflux which cause her asthma to not be well controlled. He recommended low dose steroids at 10-15mg  po daily for a few weeks. He also felt that she would be a good [facility]  patient. Consulted CM for this.  Elwin Sleight was started in the hospital in place of home Advair 2/2 Pharmacy formulary. The Prednisone was decreased from 60mg  to 40mg  daily with plans to taper to 10mg  daily which she is to continue until she is seen by [facility] Pulmonology in 1-2 weeks. She will also need to get her Xolair injection from Dr. Roxy Cedar office. During this admission, we did attempt to have Care Management consult [name] to evaluate the patient, but [name] did not feel that her insurance qualified for the program, per their note. - Prednisone to 40 mg dailyx2 days, then 20mg  x2 days, then, 10mg  po daily until seen by Pulmonology  - Continue Advair 250/50 1 inhalation BID, rinse mouth after using - Albuterol inhaler 2p q6h PRN SOB, wheezing  - Duonebs Q4 PRN  - Protonix 40 mg BID  - Benadryl for post nasal drip  - Continue Klonopin for anxiety and breathlessness   2. Leukocytosis: WBC count was elevated at 24.8 on admission, which was mildly increased from the last count we have in the system. She is afebrile and has no focal signs of infection so this is likely secondary to prednisone she received in the ED. Pt remained afebrile.  Leukocytosis was improving prior to d/c home. This will need to be reevaluated as an outpatient.  Recent Labs  Lab  10/21/12 0725  10/22/12 1215  10/23/12 0445   WBC  24.8*  20.3*  23.3*     3. Hypertension: Blood pressure on arrival was mildly elevated to 177/101. She is not currently on medication for hypertension. Likely elevated with respiratory distress. HCTZ started 6/26. BP improved but still elevated. Will likely need to add additional agent at f/u if BP does not improve.  - HCTZ 25mg  po daily   4. Chronic Allergic rhinitis: She has a history of atopy and receives Xolair injections for her allergic asthma. She is on Benadryl to help control post nasal drainage. This might need to be changed if it is causing her to be drowsy.  5. Esophageal reflux: She states that she has only been taking her omeprazole 20 mg when she eats a meal that she knows will cause heart burn. She may have silent reflux that is exacerbating her asthma symptoms at baseline. The patient was counseled to take her omeprazole 20 mg BID every day, 30 minutes prior to her largest meal and at bedtime. Protonix BID while in the hospital 2/2 Pharmacy formulary. - Omeprazole 20mg  BID    Discharge Vitals:   BP 143/75  Pulse 94  Temp(Src) 98 F (36.7 C) (Oral)  Resp 18  Ht 5\' 6"  (1.676 m)  Wt 374 lb 12.5 oz (170 kg)  BMI 60.52 kg/m2  SpO2 100%  LMP 10/04/2012  Discharge Labs:  Results for orders placed during the hospital encounter of 10/21/12 (from the past 24 hour(s))  CBC     Status: Abnormal   Collection Time    10/23/12  4:45 AM      Result Value Range   WBC 23.3 (*) 4.0 - 10.5 K/uL   RBC 4.17  3.87 - 5.11 MIL/uL   Hemoglobin 11.4 (*) 12.0 - 15.0 g/dL   HCT 40.9  81.1 - 91.4 %   MCV 86.6  78.0 - 100.0 fL   MCH 27.3  26.0 - 34.0 pg   MCHC 31.6  30.0 - 36.0 g/dL   RDW 78.2 (*) 95.6 - 21.3 %   Platelets 368  150 - 400 K/uL  BASIC METABOLIC PANEL     Status: Abnormal   Collection Time    10/23/12   4:45 AM      Result Value Range   Sodium 137  135 - 145 mEq/L   Potassium 3.7  3.5 - 5.1 mEq/L   Chloride 100  96 - 112 mEq/L   CO2 26  19 - 32 mEq/L   Glucose, Bld 107 (*) 70 - 99 mg/dL   BUN 9  6 - 23 mg/dL   Creatinine, Ser 9.60  0.50 - 1.10 mg/dL   Calcium 8.3 (*) 8.4 - 10.5 mg/dL   GFR calc non Af Amer >90  >90 mL/min   GFR calc Af Amer >90  >90 mL/min    Signed: Genelle Gather, MD 10/23/2012, 2:01 PM   Time Spent on Discharge: 35 minutes Services Ordered on Discharge: None Equipment Ordered on Discharge: None

## 2012-10-23 NOTE — Progress Notes (Signed)
Subjective: Pt states that she is feeling better since admission. Initially requiring Bipap on admission, but since has only required nasal canula since. Tolerating a diet. Able to ambulate down the hall and back w/o becoming SOB.   Objective: Vital signs in last 24 hours: Filed Vitals:   10/22/12 1928 10/22/12 2023 10/22/12 2313 10/23/12 0510  BP:  149/63 149/75 143/75  Pulse:  89 74 94  Temp:  98.3 F (36.8 C) 98.1 F (36.7 C) 98 F (36.7 C)  TempSrc:  Oral    Resp:  19 18 18   Height:      Weight:      SpO2: 100% 95% 100% 100%   Weight change:   Intake/Output Summary (Last 24 hours) at 10/23/12 0844 Last data filed at 10/23/12 0600  Gross per 24 hour  Intake    240 ml  Output      0 ml  Net    240 ml   Vitals reviewed. General: Sitting up in bed, NAD HEENT: PERRL, EOMI, no scleral icterus Cardiac: Tachycardic, no rubs, murmurs or gallops Pulm: Receiving a breathing treatment. Clear to auscultation bilaterally Abd: Obese, soft, nontender, nondistended, BS present Ext: Warm and well perfused Neuro: Alert and oriented X3, cranial nerves II-XII grossly intact, strength and sensation equal in bilateral upper and lower extremities  Lab Results: Basic Metabolic Panel:  Recent Labs Lab 10/21/12 0725  10/22/12 1215 10/23/12 0445  NA 136  < > 137 137  K 4.1  < > 4.2 3.7  CL 99  < > 102 100  CO2 26  < > 23 26  GLUCOSE 141*  < > 152* 107*  BUN 10  < > 10 9  CREATININE 0.78  < > 0.75 0.73  CALCIUM 8.6  < > 8.1* 8.3*  MG 2.0  --   --   --   < > = values in this interval not displayed. Liver Function Tests:  Recent Labs Lab 10/21/12 0725  AST 15  ALT 15  ALKPHOS 89  BILITOT 0.1*  PROT 6.7  ALBUMIN 2.9*   CBC:  Recent Labs Lab 10/22/12 1215 10/23/12 0445  WBC 20.3* 23.3*  HGB 12.3 11.4*  HCT 38.7 36.1  MCV 86.8 86.6  PLT 341 368    Micro Results: Recent Results (from the past 240 hour(s))  MRSA PCR SCREENING     Status: None   Collection Time      10/21/12 10:38 AM      Result Value Range Status   MRSA by PCR NEGATIVE  NEGATIVE Final   Comment:            The GeneXpert MRSA Assay (FDA     approved for NASAL specimens     only), is one component of a     comprehensive MRSA colonization     surveillance program. It is not     intended to diagnose MRSA     infection nor to guide or     monitor treatment for     MRSA infections.   Studies/Results: No results found. Medications: I have reviewed the patient's current medications. Scheduled Meds: . albuterol  2.5 mg Nebulization QID   And  . ipratropium  0.5 mg Nebulization QID  . enoxaparin (LOVENOX) injection  80 mg Subcutaneous Q24H  . hydrochlorothiazide  25 mg Oral Daily  . nicotine  14 mg Transdermal Daily  . pantoprazole  40 mg Oral BID  . predniSONE  60 mg Oral Q  breakfast  . sodium chloride  3 mL Intravenous Q12H   Continuous Infusions:   PRN Meds:.acetaminophen, acetaminophen, albuterol, clonazePAM, diphenhydrAMINE, ipratropium, ondansetron (ZOFRAN) IV, ondansetron  Assessment/Plan:  April [name] is a 43 year old woman who presents to the [facility] ED in an acute Asthma exacerbation.   1. Acute Asthma Exacerbation: April [name] presented with an asthma exacerbation. On presentation she was hypoxic when she sat up or tried to move at all. On evaluation she was saturating >90% while sitting up but with any activity she dropped into the upper 80s of saturation with diffuse expiratory wheezes and mild respiratory distress. She states that she has been smoking 2-3 cigarettes per day and is exposed to cigarette smoke at home. She also notes more post nasal drainage and has not been taking an anti-histamine; in addition, she has also not been controlling her reflux well either. She did report compliance with her breathing medications and was actually taking it more often then prescribed. Chest x-ray was negative for infiltrate. On admission, she was in respiratory distress and required  bipap. She was also started on Prednisone and scheduled Duoneb therapy. Her respiratory status improved, and she was tolerating nasal canula and is now on room air. I spoke with Dr. Maple [facility], her outpateint pulmonologist, who states that the pt is very noncompliant with her medications and has anxiety and uncontrolled reflux which cause her asthma to not be well controlled. He recommended low dose steroids at 10-15mg  po daily for a few weeks. He also felt that she would be a good [facility] patient. Consulted CM for this. - Restart Dulera in place of home Advair while in the hospital. - Decrease Prednisone to 40 mg daily, will taper to 10mg  po daily until seen by Pulmonology - Albuterol inhaler 2p q6h PRN SOB, wheezing - Duonebs Q4 PRN  - Protonix 40 mg BID  - Benadryl for post nasal drip  - Continue Klonopin for anxiety and breathlessness  - CM consult for [facility]  2. Leukocytosis: WBC count was elevated at 24.8 today which was mildly increased from the last count we have in the system. She is afebrile and has no focal signs of infection so this is likely secondary to prednisone she received in the ED. Pt remains afebrile. Will monitor   Recent Labs Lab 10/21/12 0725 10/22/12 1215 10/23/12 0445  WBC 24.8* 20.3* 23.3*   - Monitor fever curve  - Recheck CBC now  3. Hypertension: Blood pressure on arrival was mildly elevated to 177/101. She is not currently on medication for hypertension. Likely elevated with respiratory distress. HCTZ started 6/26. BP improved but still elevated. Will likely need to add additional agent at f/u if BP does not improve. - HCTZ 25mg  po daily   4. Chronic Allergic rhinitis: She has a history of atopy and receives Xolair injections for her allergic asthma.  - Benadryl to help control post nasal drainage. Suggest Chlorpheniramine as an outpatient BID   5. Esophageal reflux: She states that she has been taking her omeprazole 20 mg only when she eats a meal that she knows will  cause heart burn. She may have silent reflux that is exacerbating her asthma symptoms at baseline.  - Counsel patient to take 20 mg BID every day, 30 minutes prior to her largest meal and at bedtime  - Protonix BID while in the hospital.   6. DVT PPx: Lovenox   Dispo: Disposition is deferred at this time, awaiting improvement of current medical problems.  Anticipated  discharge in approximately 0-1 day(s).   The patient does have a current PCP Mathis Dad, MD) and does need an [facility] hospital follow-up appointment after discharge.  The patient does not have transportation limitations that hinder transportation to clinic appointments.  .Services Needed at time of discharge: Y = Yes, Blank = No PT:   OT:   RN:   Equipment:   Other:     LOS: 2 days   Genelle Gather, MD 10/23/2012, 8:44 AM

## 2012-10-27 NOTE — Discharge Summary (Signed)
  Date: 10/27/2012  Patient name: April [name]  Medical record number: [id number]  Date of birth: [birth year]   This patient has been seen and the plan of care was discussed with the house staff. Please see their note for complete details. I concur with their findings and plan.  Jonah Blue, DO 10/27/2012, 3:00 PM

## 2012-10-28 ENCOUNTER — Emergency Department (HOSPITAL_COMMUNITY)
Admission: EM | Admit: 2012-10-28 | Discharge: 2012-10-28 | Disposition: A | Discharge status: Home / Self Care | Payer: Commercial Managed Care - PPO | Attending: Emergency Medicine | Admitting: Emergency Medicine

## 2012-10-28 ENCOUNTER — Encounter: Payer: Self-pay | Admitting: Internal Medicine

## 2012-10-28 ENCOUNTER — Encounter (HOSPITAL_COMMUNITY): Payer: Self-pay | Admitting: *Deleted

## 2012-10-28 ENCOUNTER — Ambulatory Visit (INDEPENDENT_AMBULATORY_CARE_PROVIDER_SITE_OTHER): Payer: Commercial Managed Care - PPO | Admitting: Internal Medicine

## 2012-10-28 ENCOUNTER — Emergency Department (HOSPITAL_COMMUNITY): Payer: Commercial Managed Care - PPO

## 2012-10-28 VITALS — BP 154/85 | HR 94 | Temp 98.3°F | Ht 66.0 in | Wt 363.8 lb

## 2012-10-28 DIAGNOSIS — R05 Cough: Secondary | ICD-10-CM | POA: Insufficient documentation

## 2012-10-28 DIAGNOSIS — Z8659 Personal history of other mental and behavioral disorders: Secondary | ICD-10-CM | POA: Insufficient documentation

## 2012-10-28 DIAGNOSIS — F17200 Nicotine dependence, unspecified, uncomplicated: Secondary | ICD-10-CM | POA: Insufficient documentation

## 2012-10-28 DIAGNOSIS — J4551 Severe persistent asthma with (acute) exacerbation: Secondary | ICD-10-CM

## 2012-10-28 DIAGNOSIS — I10 Essential (primary) hypertension: Secondary | ICD-10-CM | POA: Insufficient documentation

## 2012-10-28 DIAGNOSIS — Z8619 Personal history of other infectious and parasitic diseases: Secondary | ICD-10-CM | POA: Insufficient documentation

## 2012-10-28 DIAGNOSIS — K219 Gastro-esophageal reflux disease without esophagitis: Secondary | ICD-10-CM | POA: Insufficient documentation

## 2012-10-28 DIAGNOSIS — D72829 Elevated white blood cell count, unspecified: Secondary | ICD-10-CM | POA: Insufficient documentation

## 2012-10-28 DIAGNOSIS — Z8742 Personal history of other diseases of the female genital tract: Secondary | ICD-10-CM | POA: Insufficient documentation

## 2012-10-28 DIAGNOSIS — R569 Unspecified convulsions: Secondary | ICD-10-CM | POA: Insufficient documentation

## 2012-10-28 DIAGNOSIS — J4531 Mild persistent asthma with (acute) exacerbation: Secondary | ICD-10-CM

## 2012-10-28 DIAGNOSIS — Z79899 Other long term (current) drug therapy: Secondary | ICD-10-CM | POA: Insufficient documentation

## 2012-10-28 DIAGNOSIS — R059 Cough, unspecified: Secondary | ICD-10-CM | POA: Insufficient documentation

## 2012-10-28 DIAGNOSIS — Z6841 Body Mass Index (BMI) 40.0 and over, adult: Secondary | ICD-10-CM

## 2012-10-28 DIAGNOSIS — J45901 Unspecified asthma with (acute) exacerbation: Secondary | ICD-10-CM

## 2012-10-28 DIAGNOSIS — G4700 Insomnia, unspecified: Secondary | ICD-10-CM | POA: Insufficient documentation

## 2012-10-28 DIAGNOSIS — Z872 Personal history of diseases of the skin and subcutaneous tissue: Secondary | ICD-10-CM | POA: Insufficient documentation

## 2012-10-28 DIAGNOSIS — J441 Chronic obstructive pulmonary disease with (acute) exacerbation: Secondary | ICD-10-CM | POA: Insufficient documentation

## 2012-10-28 LAB — POCT I-STAT, CHEM 8
Chloride: 104 mEq/L (ref 96–112)
Glucose, Bld: 102 mg/dL — ABNORMAL HIGH (ref 70–99)
HCT: 40 % (ref 36.0–46.0)
Hemoglobin: 13.6 g/dL (ref 12.0–15.0)
Potassium: 4.1 mEq/L (ref 3.5–5.1)

## 2012-10-28 LAB — CBC WITH DIFFERENTIAL/PLATELET
Basophils Absolute: 0 10*3/uL (ref 0.0–0.1)
Basophils Relative: 0 % (ref 0–1)
Eosinophils Absolute: 0.1 10*3/uL (ref 0.0–0.7)
MCH: 27.7 pg (ref 26.0–34.0)
MCHC: 32.4 g/dL (ref 30.0–36.0)
Neutro Abs: 16.9 10*3/uL — ABNORMAL HIGH (ref 1.7–7.7)
Neutrophils Relative %: 77 % (ref 43–77)
Platelets: 350 10*3/uL (ref 150–400)
RDW: 16.4 % — ABNORMAL HIGH (ref 11.5–15.5)

## 2012-10-28 MED ORDER — BENZONATATE 200 MG PO CAPS: 200.0000 mg | ORAL_CAPSULE | Freq: Three times a day (TID) | ORAL | Status: DC | PRN

## 2012-10-28 MED ORDER — ALBUTEROL SULFATE (5 MG/ML) 0.5% IN NEBU
INHALATION_SOLUTION | RESPIRATORY_TRACT | Status: AC
  Administered 2012-10-28: 2.5 mg
  Filled 2012-10-28: qty 1

## 2012-10-28 MED ORDER — IPRATROPIUM BROMIDE 0.02 % IN SOLN
0.5000 mg | Freq: Once | RESPIRATORY_TRACT | Status: AC
  Administered 2012-10-28: 0.5 mg via RESPIRATORY_TRACT
  Filled 2012-10-28: qty 2.5

## 2012-10-28 MED ORDER — PREDNISONE 20 MG PO TABS
60.0000 mg | ORAL_TABLET | Freq: Once | ORAL | Status: AC
  Administered 2012-10-28: 60 mg via ORAL
  Filled 2012-10-28: qty 3

## 2012-10-28 MED ORDER — ALBUTEROL SULFATE (5 MG/ML) 0.5% IN NEBU
5.0000 mg | INHALATION_SOLUTION | Freq: Once | RESPIRATORY_TRACT | Status: AC
  Administered 2012-10-28: 5 mg via RESPIRATORY_TRACT
  Filled 2012-10-28: qty 1

## 2012-10-28 MED ORDER — CETIRIZINE HCL 10 MG PO TABS: 10.0000 mg | ORAL_TABLET | Freq: Every day | ORAL | Status: DC

## 2012-10-28 MED ORDER — OMALIZUMAB 150 MG ~~LOC~~ SOLR
225.0000 mg | Freq: Once | SUBCUTANEOUS | Status: AC
  Administered 2012-10-28: 225 mg via SUBCUTANEOUS

## 2012-10-28 NOTE — Patient Instructions (Addendum)
Thanks for your visit. Please read these instructions carefully:  1) Please continue to take your prednisone pills. Take 4 on Thursday, 4 on Friday, 3 on Saturday, 3 on Sunday, 2 on Monday. 2) Please continue to take your Dulera, 2 puffs per day. This is an inhaled steroid combined with a long acting medication that relaxes your airways. 3) I have reordered Zyrtec for you. This is an allergy medication. Please take 1 daily. 4) Please fill the prescription for Tessalon that you got in the ED. This medication will help you to cough less. 5) Continue to use your albuterol inhaler and nebulizer for relief as needed. 6) Measure your peak flows, write them down, and bring them to me at our next visit. 6) Avoid exposure to cigarette smoke. This can trigger your asthma. 7) Make an appointment to see me Monday or Tuesday.

## 2012-10-28 NOTE — Progress Notes (Signed)
Subjective:    Patient ID: April [name], female    DOB: June 15, 1970, 42 y.o. y.o.   MRN: [id number]  HPI [name] is a 42yo F with severe persistent asthma who presents for follow up after hospital admission. She was admitted from 6/25-6/27 for acute asthma exacerbation. Hospital course was notable for hypoxia and respiratory distress requiring BIPAP. She was started on prednisone taper and Dulera (a LABA/ICS) and f/u with her pulmonologist was arranged.   Since discharge she has continued to have breathing problems. She has been using her albuterol inhaler q1h and nebulizer q4h. She developed a dry cough that is worse at night and in the early morning, keeping her awake. She received her Xolair (anti-IgE) injection Friday, but with limited relief. She has Advair and Dulera at home and has been switching in between these medications. She has been inconsistent taking her oral steroids because she believes they are making her gain weight and causing depression.  This morning (7/2) she presented to the ED for evaluation of her cough. CXR at that time showed no acute abnormalities. CBC showed leukocytosis 21.8 which was also noted when she was an inpatient and attributed to her oral prednisone. The ED encouraged [name] to continue oral steroid taper (starting her back at 40mg /day; she had been tapered to 10mg  at presentation), prescribed Tessalon for cough, and ask her to follow up with me.  Patient is interested in weight loss, asking about a pill she saw on TV. She is concerned about the effect of prednisone on her weight.   Review of Systems  Constitutional: Negative for fever, chills and diaphoresis.  HENT: Negative for congestion, sore throat, rhinorrhea and sinus pressure.   Eyes: Negative for itching and visual disturbance.  Respiratory: Positive for cough, chest tightness and wheezing. Negative for shortness of breath.   Cardiovascular: Negative for chest pain, palpitations and leg swelling.   Gastrointestinal: Negative for nausea, vomiting and diarrhea.  Genitourinary: Negative for dysuria and frequency.  Musculoskeletal: Positive for back pain.  Neurological: Negative for dizziness, light-headedness and headaches.     Current Outpatient Prescriptions on File Prior to Visit  Medication Sig Dispense Refill  . albuterol (PROVENTIL HFA;VENTOLIN HFA) 108 (90 BASE) MCG/ACT inhaler Inhale 2 puffs into the lungs every 4 (four) hours as needed for wheezing or shortness of breath.      [name] albuterol (PROVENTIL) (2.5 MG/3ML) 0.083% nebulizer solution Take 2.5 mg by nebulization every 4 (four) hours as needed for wheezing or shortness of breath.       . omalizumab (XOLAIR) 150 MG injection Inject 225 mg into the skin every 14 (fourteen) days.      [name] omeprazole (PRILOSEC) 20 MG capsule Take 1 capsule (20 mg total) by mouth 2 (two) times daily before a meal.  30 capsule  12  . hydrochlorothiazide (HYDRODIURIL) 25 MG tablet Take 1 tablet (25 mg total) by mouth daily.  30 tablet  11  . nicotine (NICODERM CQ - DOSED IN MG/24 HOURS) 21 mg/24hr patch Place 1 patch onto the skin daily.  28 patch  0  . [DISCONTINUED] albuterol (PROVENTIL HFA;VENTOLIN HFA) 108 (90 BASE) MCG/ACT inhaler Inhale 1-2 puffs into the lungs every 6 (six) hours as needed for wheezing.  1 Inhaler  0   No current facility-administered medications on file prior to visit.        Objective:   Physical Exam  Constitutional: She is oriented to person, place, and time. She appears well-developed and well-nourished. No distress.  HENT:  Head: Normocephalic and atraumatic.  Mouth/Throat: Oropharynx is clear and moist. No oropharyngeal exudate.  Eyes: Pupils are equal, round, and reactive to light. Right eye exhibits no discharge. Left eye exhibits no discharge.  Neck: Neck supple.  Cardiovascular: Normal rate, regular rhythm and normal heart sounds.   Pulmonary/Chest: Accessory muscle usage present. No stridor. Tachypnea noted.  No respiratory distress. She has wheezes.  RR as measured during PEX was 26. Mild wheezing in all lung fields and some accessory muscle use, but patient is moving air well. Symmetric chest wall expansion. No tripoding. Peak flows 550, 700, 625.  Musculoskeletal: Normal range of motion.  Neurological: She is alert and oriented to person, place, and time.  Skin: Skin is warm and dry. No rash noted. She is not diaphoretic.          Assessment & Plan:

## 2012-10-28 NOTE — Assessment & Plan Note (Signed)
WBC 21.8. Unchanged from recent prior CBCs. CXR shows no infiltrate.  - Likely attributable to prednisone - Recheck at f/u next week

## 2012-10-28 NOTE — Assessment & Plan Note (Addendum)
April [name] asthma appears to be out of control due to medication confusion and noncompliance. I spent about 30 minutes explaining to her what her medicines are for, and why it is so important for her to take them since she is recovering from an acute exacerbation of her asthma. - Continue steroid taper. Patient has 20 x 10mg  pills left in her bottle. We will reset her taper at 40mg . She has been instructed to take 40mg  Thursday, 40mg  Friday, 30mg  Saturday, 30mg  Sunday, 20mg  Monday before she comes to see me. - Continue home Dulera, 2 puffs per day. I explained that Dulera and Advair are the same type of medication, so she will not take Advair at the same time. (LABA/ICS) - Reordered Zyrtec (antihistimine) - Continue Tessalon (cough suppressor - ordered by ED - she has a paper script) - Continue albuterol inhaler and nebulizer PRN - Got Xolair injection 6/27 - She was encouraged to measure her peak flows regularly, write them down, and bring them with her to the office next week. - Avoid cigarette smoke exposure - She will follow up with me on Monday or Tuesday

## 2012-10-28 NOTE — ED Provider Notes (Signed)
History    CSN: [id number] Arrival date & time 10/28/12  4098  None    Chief Complaint  Patient presents with  . Asthma  . Seizures   (Consider location/radiation/quality/duration/timing/severity/associated sxs/prior Treatment) HPI Comments: 42 y.o. With PMHx of asthma, HTN, sleep apnea, COPD, morbid obesity presents today with gradual worsening asthma exacerbation that started Monday. Pt describes the severity of this most recent event as a 6/10, not as severe as her most recent exacerbation. It has been intermittent since Monday increasing in frequency. Pt has tried home nebulizer and inhaler without relief. Pt states she was at Dr. Roxy Cedar office (her pulmonologist) on Friday, when he gave her a script of Prednisone to start. She took the first dose on Monday and does not feel that it helped. Pt states it is easier for her to breath lying on her back, but she is not comfortable. Pt admits dry cough, wheezing, shortness of breath, difficulty breathing. Denies fever, nausea, vomiting, sick contacts, abdominal pain.   Pt also states that husband told her she was having a seizure in the middle of the night last night. Pt has a distance hx of seizures (last was 10 years ago). Pt takes no meds for seizures. Pt states husband said she had gotten up out of bed and was standing there, shaking. She awoke with a start when he hugged her and then fell to the ground on right knee. Pt was post ictal for only a few seconds, right away recognizing her husband and her surroundings, but not knowing what had happened.     Patient is a 42 y.o. female presenting with asthma and seizures.  Asthma Associated symptoms include coughing. Pertinent negatives include no chest pain, diaphoresis, fever, headaches, nausea, neck pain, numbness, vomiting or weakness.  Seizures  Past Medical History  Diagnosis Date  . Acanthosis nigricans   . Menorrhagia   . Hypertension, essential   . Insomnia   . Morbid obesity    . Helicobacter pylori (H. pylori) infection   . Sleep apnea     Sleep study 2008 - mild OSA, not enough events to titrate CPAP  . COPD (chronic obstructive pulmonary disease)     PFTs in 2002, FEV1/FVC 65, no post bronchodilater test done  . Asthma     Followed by Dr. Maple Hudson (pulmonology); receives every other week omalizumab injections; has frequent exacerbations  . Depression   . GERD (gastroesophageal reflux disease)   . Tobacco user   . Obesity    Past Surgical History  Procedure Laterality Date  . Tubal ligation  1996    bilateral  . Breast reduction surgery  09/2011   Family History  Problem Relation Age of Onset  . Asthma Daughter   . Cancer Paternal Aunt   . Asthma Maternal Grandmother   . Hypertension Mother    History  Substance Use Topics  . Smoking status: Current Some Day Smoker -- 0.25 packs/day for 9 years    Types: Cigarettes  . Smokeless tobacco: Never Used     Comment: 2-3 cigarettes per day, second hand smoke at home  . Alcohol Use: No   OB History   Grav Para Term Preterm Abortions TAB SAB Ect Mult Living                 Review of Systems  Constitutional: Negative for fever and diaphoresis.  HENT: Negative for neck pain and neck stiffness.   Eyes: Negative for visual disturbance.  Respiratory: Positive for cough,  shortness of breath and wheezing. Negative for apnea and chest tightness.        Dry  Cardiovascular: Negative for chest pain and palpitations.  Gastrointestinal: Negative for nausea, vomiting, diarrhea and constipation.  Genitourinary: Negative for dysuria.  Musculoskeletal: Negative for gait problem.  Skin:       Minor abrasion to right knee. Bleeding controlled  Neurological: Positive for seizures. Negative for dizziness, weakness, light-headedness, numbness and headaches.    Allergies  Ace inhibitors; Percocet; and Zolpidem tartrate  Home Medications   Current Outpatient Rx  Name  Route  Sig  Dispense  Refill  . albuterol  (PROVENTIL HFA;VENTOLIN HFA) 108 (90 BASE) MCG/ACT inhaler   Inhalation   Inhale 2 puffs into the lungs every 4 (four) hours as needed for wheezing or shortness of breath.         [name] albuterol (PROVENTIL) (2.5 MG/3ML) 0.083% nebulizer solution   Nebulization   Take 2.5 mg by nebulization every 4 (four) hours as needed for wheezing or shortness of breath.          . clonazePAM (KLONOPIN) 0.5 MG tablet   Oral   Take 0.5-1 mg by mouth at bedtime as needed (sleep).         . EPINEPHrine (EPIPEN) 0.3 mg/0.3 mL DEVI   Intramuscular   Inject 0.3 mg into the muscle daily as needed (anaphylaxis).          . Fluticasone-Salmeterol (ADVAIR DISKUS) 250-50 MCG/DOSE AEPB   Inhalation   Inhale 1 puff into the lungs 2 (two) times daily. Rinse mouth   60 each   prn   . hydrochlorothiazide (HYDRODIURIL) 25 MG tablet   Oral   Take 1 tablet (25 mg total) by mouth daily.   30 tablet   11   . nicotine (NICODERM CQ - DOSED IN MG/24 HOURS) 21 mg/24hr patch   Transdermal   Place 1 patch onto the skin daily.   28 patch   0   . omalizumab (XOLAIR) 150 MG injection   Subcutaneous   Inject 225 mg into the skin every 14 (fourteen) days.         [name] omeprazole (PRILOSEC) 20 MG capsule   Oral   Take 1 capsule (20 mg total) by mouth 2 (two) times daily before a meal.   30 capsule   12   . predniSONE (DELTASONE) 10 MG tablet      Take as directed, per discharge instructions   30 tablet   0    BP 166/92  Pulse 90  Temp(Src) 98.5 F (36.9 C) (Oral)  Resp 22  SpO2 100%  LMP 10/04/2012 Physical Exam  Nursing note and vitals reviewed. Constitutional: She is oriented to person, place, and time. She appears well-developed and well-nourished. No distress.  HENT:  Head: Normocephalic and atraumatic.  Eyes: Conjunctivae and EOM are normal.  Neck: Normal range of motion. Neck supple.  No meningeal signs  Cardiovascular: Normal rate, regular rhythm and normal heart sounds.  Exam reveals no  gallop and no friction rub.   No murmur heard. Pulmonary/Chest: Effort normal. No respiratory distress. She has wheezes. She has no rales. She exhibits no tenderness.  Bilateral wheezes, expiratory and inspiratory, upper and lower lobes  Abdominal: Soft. Bowel sounds are normal. She exhibits no distension. There is no tenderness. There is no rebound and no guarding.  Musculoskeletal: Normal range of motion. She exhibits no edema and no tenderness.  Neurological: She is alert and oriented to person, place,  and time. No cranial nerve deficit.  Skin: Skin is warm and dry. She is not diaphoretic. No erythema.  Psychiatric: She has a normal mood and affect.    ED Course  Procedures (including critical care time) Labs Reviewed  CBC WITH DIFFERENTIAL - Abnormal; Notable for the following:    WBC 21.8 (*)    RDW 16.4 (*)    Neutro Abs 16.9 (*)    Monocytes Absolute 1.7 (*)    All other components within normal limits  POCT I-STAT, CHEM 8 - Abnormal; Notable for the following:    Glucose, Bld 102 (*)    All other components within normal limits   Dg Chest 2 View  10/28/2012   *RADIOLOGY REPORT*  Clinical Data: Exacerbation of asthma  CHEST - 2 VIEW  Comparison: 10/21/2012  Findings: The heart size and mediastinal contours are within normal limits.  Both lungs are clear.  The visualized skeletal structures are unremarkable.  IMPRESSION: No acute cardiopulmonary abnormalities.   Original Report Authenticated By: Signa Kell, M.D.   1. Acute asthma exacerbation, mild persistent   2. Leukocytosis     MDM  Bilateral wheezing on PE. Labs and CXR are at baseline. Patient ambulated in ED with O2 saturations maintained >90, no current signs of respiratory distress. Lung exam improved after 2 nebulizer treatments. Prednisone given in the ED. Pt states they are breathing at baseline and would like cough suppressant for home. Pt has been instructed to continue using prescribed medications and to speak  with PCP about today's exacerbation. She has an appt today at 3:15pm.  At this time there does not appear to be any evidence of an acute emergency medical condition and the patient appears stable for discharge with appropriate outpatient follow up.Diagnosis was discussed with patient who verbalizes understanding and is agreeable to discharge. Pt case discussed with Dr. Rubin Payor who agrees with my plan.     Glade Nurse, PA-C 10/28/12 1645

## 2012-10-28 NOTE — ED Notes (Signed)
Pt reports recently being admitted to hospital for asthma, still having cough and wheezing, unable to sleep at night. Husband thinks pt had seizure in her sleep? Audible wheezing noted at triage, airway intact, able to speak in full sentences, a&ox4.

## 2012-10-28 NOTE — ED Notes (Signed)
Pt states that she cannot use the bathroom at this time

## 2012-10-28 NOTE — Progress Notes (Signed)
Peak flows - 1. 550; 2. 700, and 3. 625.

## 2012-10-28 NOTE — ED Notes (Signed)
Patient transported to X-ray 

## 2012-10-28 NOTE — Assessment & Plan Note (Signed)
Patient is obese. She would like to lose weight.  - Informed that there is no magic pill to lose weight - We discussed diet and exercise. Set a goal of walking x 3days/week. - Will address weight loss plan more after her asthma is under better control.  - Consider referral to [facility] in the future.

## 2012-10-28 NOTE — ED Notes (Signed)
PA at bedside.

## 2012-10-29 ENCOUNTER — Other Ambulatory Visit: Payer: Self-pay | Admitting: Internal Medicine

## 2012-10-29 NOTE — Progress Notes (Signed)
Case discussed with Dr. Claudell Kyle at the time of the visit.  We reviewed the resident's history and exam and pertinent patient test results.  I agree with the assessment, diagnosis, and plan of care documented in the resident's note, with the following additional comments.  April [name] should be used twice a day as per the prescription sig.  Review of the chart shows that the EpiPen was initially prescribed in 2011 by Dr. Maple Hudson; this may have been as a precaution since patient is getting Xolair injections - would clarify with Dr. Maple Hudson whether to continue as an active medication.

## 2012-10-29 NOTE — ED Provider Notes (Signed)
Medical screening examination/treatment/procedure(s) were performed by non-physician practitioner and as supervising physician I was immediately available for consultation/collaboration.  Juliet Rude. Rubin Payor, MD 10/29/12 1103

## 2012-11-02 ENCOUNTER — Inpatient Hospital Stay (HOSPITAL_COMMUNITY)
Admission: AD | Admit: 2012-11-02 | Discharge: 2012-11-03 | DRG: 191 | Disposition: A | Discharge status: Home / Self Care | Payer: Commercial Managed Care - PPO | Source: Ambulatory Visit | Attending: Internal Medicine | Admitting: Internal Medicine

## 2012-11-02 ENCOUNTER — Telehealth: Payer: Self-pay | Admitting: Internal Medicine

## 2012-11-02 ENCOUNTER — Telehealth: Payer: Self-pay | Admitting: *Deleted

## 2012-11-02 ENCOUNTER — Encounter: Payer: Self-pay | Admitting: Internal Medicine

## 2012-11-02 ENCOUNTER — Ambulatory Visit (INDEPENDENT_AMBULATORY_CARE_PROVIDER_SITE_OTHER): Payer: Commercial Managed Care - PPO | Admitting: Internal Medicine

## 2012-11-02 VITALS — BP 160/90 | HR 90 | Temp 98.0°F | Ht 66.0 in | Wt 367.3 lb

## 2012-11-02 DIAGNOSIS — I10 Essential (primary) hypertension: Secondary | ICD-10-CM

## 2012-11-02 DIAGNOSIS — G4730 Sleep apnea, unspecified: Secondary | ICD-10-CM

## 2012-11-02 DIAGNOSIS — K219 Gastro-esophageal reflux disease without esophagitis: Secondary | ICD-10-CM | POA: Diagnosis present

## 2012-11-02 DIAGNOSIS — J4551 Severe persistent asthma with (acute) exacerbation: Secondary | ICD-10-CM

## 2012-11-02 DIAGNOSIS — D72829 Elevated white blood cell count, unspecified: Secondary | ICD-10-CM | POA: Diagnosis present

## 2012-11-02 DIAGNOSIS — Z9989 Dependence on other enabling machines and devices: Secondary | ICD-10-CM | POA: Diagnosis present

## 2012-11-02 DIAGNOSIS — J069 Acute upper respiratory infection, unspecified: Secondary | ICD-10-CM | POA: Diagnosis present

## 2012-11-02 DIAGNOSIS — J45901 Unspecified asthma with (acute) exacerbation: Secondary | ICD-10-CM | POA: Diagnosis present

## 2012-11-02 DIAGNOSIS — F5101 Primary insomnia: Secondary | ICD-10-CM | POA: Diagnosis present

## 2012-11-02 DIAGNOSIS — J4541 Moderate persistent asthma with (acute) exacerbation: Secondary | ICD-10-CM

## 2012-11-02 DIAGNOSIS — Z6841 Body Mass Index (BMI) 40.0 and over, adult: Secondary | ICD-10-CM

## 2012-11-02 DIAGNOSIS — G4733 Obstructive sleep apnea (adult) (pediatric): Secondary | ICD-10-CM | POA: Diagnosis present

## 2012-11-02 DIAGNOSIS — IMO0002 Reserved for concepts with insufficient information to code with codable children: Secondary | ICD-10-CM

## 2012-11-02 DIAGNOSIS — J441 Chronic obstructive pulmonary disease with (acute) exacerbation: Principal | ICD-10-CM | POA: Diagnosis present

## 2012-11-02 DIAGNOSIS — J9819 Other pulmonary collapse: Secondary | ICD-10-CM | POA: Diagnosis present

## 2012-11-02 DIAGNOSIS — Z87891 Personal history of nicotine dependence: Secondary | ICD-10-CM | POA: Diagnosis present

## 2012-11-02 DIAGNOSIS — G4700 Insomnia, unspecified: Secondary | ICD-10-CM | POA: Diagnosis present

## 2012-11-02 DIAGNOSIS — F17200 Nicotine dependence, unspecified, uncomplicated: Secondary | ICD-10-CM | POA: Diagnosis present

## 2012-11-02 MED ORDER — HYDROCHLOROTHIAZIDE 25 MG PO TABS: 25.0000 mg | ORAL_TABLET | Freq: Every day | ORAL | Status: DC

## 2012-11-02 MED ORDER — IPRATROPIUM BROMIDE 0.02 % IN SOLN
0.5000 mg | Freq: Four times a day (QID) | RESPIRATORY_TRACT | Status: DC
  Administered 2012-11-02 – 2012-11-03 (×4): 0.5 mg via RESPIRATORY_TRACT
  Filled 2012-11-02 (×4): qty 2.5

## 2012-11-02 MED ORDER — NICOTINE 7 MG/24HR TD PT24
7.0000 mg | MEDICATED_PATCH | Freq: Every day | TRANSDERMAL | Status: DC
  Administered 2012-11-02 – 2012-11-03 (×2): 7 mg via TRANSDERMAL
  Filled 2012-11-02 (×2): qty 1

## 2012-11-02 MED ORDER — ALBUTEROL SULFATE (5 MG/ML) 0.5% IN NEBU
2.5000 mg | INHALATION_SOLUTION | Freq: Four times a day (QID) | RESPIRATORY_TRACT | Status: DC
  Administered 2012-11-02 – 2012-11-03 (×4): 2.5 mg via RESPIRATORY_TRACT
  Filled 2012-11-02 (×4): qty 0.5

## 2012-11-02 MED ORDER — METHYLPREDNISOLONE SODIUM SUCC 125 MG IJ SOLR
125.0000 mg | Freq: Two times a day (BID) | INTRAMUSCULAR | Status: DC
  Administered 2012-11-02 – 2012-11-03 (×2): 125 mg via INTRAVENOUS
  Filled 2012-11-02 (×4): qty 2

## 2012-11-02 MED ORDER — PANTOPRAZOLE SODIUM 40 MG PO TBEC
40.0000 mg | DELAYED_RELEASE_TABLET | Freq: Every day | ORAL | Status: DC
  Administered 2012-11-02 – 2012-11-03 (×2): 40 mg via ORAL
  Filled 2012-11-02 (×2): qty 1

## 2012-11-02 MED ORDER — IPRATROPIUM BROMIDE 0.02 % IN SOLN
0.5000 mg | Freq: Once | RESPIRATORY_TRACT | Status: AC
  Administered 2012-11-02: 0.5 mg via RESPIRATORY_TRACT

## 2012-11-02 MED ORDER — BENZONATATE 100 MG PO CAPS
200.0000 mg | ORAL_CAPSULE | Freq: Three times a day (TID) | ORAL | Status: DC | PRN
  Administered 2012-11-03: 200 mg via ORAL
  Filled 2012-11-02: qty 2

## 2012-11-02 MED ORDER — BENZONATATE 100 MG PO CAPS: 200.0000 mg | ORAL_CAPSULE | Freq: Three times a day (TID) | ORAL | Status: DC | PRN

## 2012-11-02 MED ORDER — ENOXAPARIN SODIUM 40 MG/0.4ML ~~LOC~~ SOLN
40.0000 mg | SUBCUTANEOUS | Status: DC
  Administered 2012-11-02: 40 mg via SUBCUTANEOUS
  Filled 2012-11-02 (×2): qty 0.4

## 2012-11-02 MED ORDER — SODIUM CHLORIDE 0.9 % IJ SOLN
3.0000 mL | Freq: Two times a day (BID) | INTRAMUSCULAR | Status: DC
  Administered 2012-11-02: 3 mL via INTRAVENOUS

## 2012-11-02 MED ORDER — ALBUTEROL SULFATE (2.5 MG/3ML) 0.083% IN NEBU
2.5000 mg | INHALATION_SOLUTION | Freq: Once | RESPIRATORY_TRACT | Status: AC
  Administered 2012-11-02: 2.5 mg via RESPIRATORY_TRACT

## 2012-11-02 MED ORDER — ZOLPIDEM TARTRATE 5 MG PO TABS
5.0000 mg | ORAL_TABLET | Freq: Every evening | ORAL | Status: DC | PRN
  Administered 2012-11-02: 5 mg via ORAL
  Filled 2012-11-02: qty 1

## 2012-11-02 MED ORDER — ALBUTEROL SULFATE (5 MG/ML) 0.5% IN NEBU: 2.5000 mg | INHALATION_SOLUTION | Freq: Four times a day (QID) | RESPIRATORY_TRACT | Status: DC

## 2012-11-02 MED ORDER — ALBUTEROL SULFATE HFA 108 (90 BASE) MCG/ACT IN AERS
2.0000 | INHALATION_SPRAY | RESPIRATORY_TRACT | Status: DC | PRN
  Administered 2012-11-02 – 2012-11-03 (×3): 2 via RESPIRATORY_TRACT
  Filled 2012-11-02: qty 6.7

## 2012-11-02 MED ORDER — PREDNISONE 10 MG PO TABS: 60.0000 mg | ORAL_TABLET | Freq: Every day | ORAL | Status: DC

## 2012-11-02 NOTE — Progress Notes (Signed)
Pt c/o increased shortness of breath. Pt on 2L O2 and sats are 100%. Asking for a nebulizer instead of her inhaler right now. No nebs PRN ordered and next scheduled neb at 0200. Pt also asking for CPAP machine and something to help her sleep. MD on call Dr. Andrey Campanile made aware. No new orders placed yet.

## 2012-11-02 NOTE — Assessment & Plan Note (Signed)
Hypertension: Patient's blood pressure is elevated this afternoon at 160/90.  Pt is currently on HCTZ 25mg  daily.  May need to increase dosage or add CCB.  We will readdress this at her next visit.

## 2012-11-02 NOTE — Progress Notes (Signed)
PRN Albuterol given.

## 2012-11-02 NOTE — Telephone Encounter (Signed)
lmomtcb x1 

## 2012-11-02 NOTE — Telephone Encounter (Signed)
Suggest she go with what her primary physician tells her today

## 2012-11-02 NOTE — Progress Notes (Signed)
Subjective:    Patient ID: April [name], female    DOB: 24-Jun-1970, 42 y.o.   MRN: [id number]  HPI Comments: Patient is a 42 yo morbidly obese aaf with a PMH of asthma, HTN, and GERD.  She was recently discharged from the hospital on 6/27 for an acute asthma exacerbation.  She was started on prednisone taper and Dulera and a f/u to her pulmonologist was made.  She reports feeling better initially after the hospitalization, but now her symptoms have returned.  She presented to the ED last Wednesday for cough.  A CXR showed no acute abnormalities and a CBC showed leukocytosis of 21.8 which was also present during admission (most likely due to steroids).  The ED recommended she continue on her steroid taper, gave her some tessalon pearls for cough, and asked her to f/u with her PCP.  She was seen in the clinic on the same day as the ED and was told to continue the prednisone and Dulera (2 puffs).  She also receives Xolair injections from her pulmonologist   She presents to the clinic today with wheezing, shortness of breath, and chest tightness.  She denies any fever, chills, chest pain, or N/V/D/C.  She does report a mild headache.  She reports her symptoms are worse at night and are interfering with her ability to sleep.  She reports that she has taken all of the steroids however there was some concern in the past that she may be noncompliant due to her worries about weight gain.  She reports using her rescue inhaler 5-6x/hr and nebulizer treatments 4x/day with no improvement.  Her peak flow at home this morning was 150.    In the clinic, we gave took her peak flows before a nebulizer treatment and after treatment.   Her peak flows before treatment were: 125 125 200  Her peak flows after treatment were: 130 250 100  Based on the asthma algorithm she would fall into the severe exacerbation category with her PEF <40 percent of predicted.    Since there is no change after treatment Dr. Criselda Peaches and I  will admit her for observation to try and get her asthma under better control.    Review of Systems  Constitutional: Positive for fatigue. Negative for fever, chills and diaphoresis.  HENT: Positive for voice change. Negative for ear pain.   Respiratory: Positive for cough, chest tightness, shortness of breath and wheezing.   Cardiovascular: Negative for chest pain, palpitations and leg swelling.  Gastrointestinal: Negative for vomiting, abdominal pain, diarrhea and constipation.  Genitourinary: Negative for difficulty urinating.  Skin: Negative for rash.  Neurological: Positive for headaches.  Psychiatric/Behavioral: Positive for sleep disturbance.  All other systems reviewed and are negative.      Objective:   Physical Exam  Constitutional: She is oriented to person, place, and time. She appears well-developed and well-nourished. She appears distressed.  HENT:  Head: Normocephalic and atraumatic.  Mouth/Throat: No oropharyngeal exudate.  Eyes: EOM are normal. Pupils are equal, round, and reactive to light.  Neck: Neck supple.  Cardiovascular: Normal rate, regular rhythm, normal heart sounds and intact distal pulses.   Pulmonary/Chest: No accessory muscle usage. She has wheezes. She has no rales. She exhibits no tenderness.  Patient appears uncomfortable.  Abdominal: Soft. Bowel sounds are normal. There is no tenderness.  Neurological: She is alert and oriented to person, place, and time.  Skin: Skin is warm and dry. She is not diaphoretic.      Assessment &  Plan:  Ms. April [name] is a 42 yo aaf with a PMH of asthma, HTN, and GERD who presents to the clinic with an acute asthma exacerbation.

## 2012-11-02 NOTE — H&P (Signed)
Date: 11/03/2012               Patient Name:  April [name] MRN: [id number]  DOB: 04-Aug-1970 Age / Sex: 42 y.o., female   PCP: [name], MD         Medical Service: Internal Medicine Teaching Service         Attending Physician: Dr. Jonah Blue, DO    First Contact: Dr. Inocente Salles Pager: [phone]  Second Contact: Dr. Stacy Gardner Pager: [phone]       After Hours (After 5p/  First Contact Pager: [phone]  weekends / holidays): Second Contact Pager: [phone]   Chief Complaint: shortness of breath   History of Present Illness: April [name] is a 42 yo AAF with a PMH of asthma, OSA, HTN, GERD and morbid obesity presenting for shortness of breath.  She has had frequent asthma exacerbations and is followed by pulmonology, Dr. Jetty Duhamel.  Her last hospitalization for acute asthma exacerbation was 06/25-06/27/2014.  She was discharged in stable condition on a Prednisone taper (40 x 2 days, 20 x 2 days, 10 until she saw [facility] in 1-2 weeks), 250/50 Advair 1 inhalation BID, albuterol inhaler 2 puffs q6h prn, duonebs q4h prn, protonix 40mg  BID, benadryl and Klonopin. She is due for a  Xolair injections next week and it appears that her exacerbations occur right before she is due to receive an injection.   Today April [name] presented to clinic for SOB that began about 4 days ago.  She was found to have PEF < 40% of predicted and she was admitted for inpatient management.  She also reports cough, sore throat and URI symptoms as well.    She says she has had to increase her nebulizer use and had to take two nebulizer treatments early this morning before presenting to the ED.  She reports compliance with her home asthma meds although she feels the steroids are not helping her.    She reports she is trying to reduce her tobacco use.  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   Meds: Current Facility-Administered Medications  Medication Dose Route Frequency  Provider Last Rate Last Dose  . albuterol (PROVENTIL HFA;VENTOLIN HFA) 108 (90 BASE) MCG/ACT inhaler 2 puff  2 puff Inhalation Q4H PRN Belia Heman, MD   2 puff at 11/02/12 2318  . ipratropium (ATROVENT) nebulizer solution 0.5 mg  0.5 mg Nebulization Q6H Neema K Sharda, MD   0.5 mg at 11/02/12 1931   And  . albuterol (PROVENTIL) (5 MG/ML) 0.5% nebulizer solution 2.5 mg  2.5 mg Nebulization Q6H Neema Davina Poke, MD   2.5 mg at 11/02/12 1931  . benzonatate (TESSALON) capsule 200 mg  200 mg Oral TID PRN Belia Heman, MD      . enoxaparin (LOVENOX) injection 40 mg  40 mg Subcutaneous Q24H Belia Heman, MD   40 mg at 11/02/12 2045  . menthol-cetylpyridinium (CEPACOL) lozenge 3 mg  1 lozenge Oral PRN Christen Bame, MD      . methylPREDNISolone sodium succinate (SOLU-MEDROL) 125 mg/2 mL injection 125 mg  125 mg Intravenous Q12H Belia Heman, MD   125 mg at 11/02/12 2045  . nicotine (NICODERM CQ - dosed in mg/24 hr) patch 7 mg  7 mg Transdermal Daily Belia Heman, MD   7 mg at 11/02/12 2045  . pantoprazole (PROTONIX) EC tablet 40 mg  40 mg Oral Daily Neema K  Everardo Beals, MD   40 mg at 11/02/12 2045  . sodium chloride 0.9 % injection 3 mL  3 mL Intravenous Q12H Belia Heman, MD   3 mL at 11/02/12 2045  . zolpidem (AMBIEN) tablet 5 mg  5 mg Oral QHS PRN Evelena Peat, DO   5 mg at 11/02/12 2323    Allergies: Allergies as of 11/02/2012 - Review Complete 11/02/2012  Allergen Reaction Noted  . Ace inhibitors Other (See Comments) 11/24/2009  . Percocet (oxycodone-acetaminophen) Itching 01/13/2012   Past Medical History  Diagnosis Date  . Acanthosis nigricans   . Menorrhagia   . Hypertension, essential   . Insomnia   . Morbid obesity   . Helicobacter pylori (H. pylori) infection   . Sleep apnea     Sleep study 2008 - mild OSA, not enough events to titrate CPAP  . COPD (chronic obstructive pulmonary disease)     PFTs in 2002, FEV1/FVC 65, no post bronchodilater test done  . Asthma     Followed by  Dr. Maple Hudson (pulmonology); receives every other week omalizumab injections; has frequent exacerbations  . Depression   . GERD (gastroesophageal reflux disease)   . Tobacco user   . Obesity    Past Surgical History  Procedure Laterality Date  . Tubal ligation  1996    bilateral  . Breast reduction surgery  09/2011   Family History  Problem Relation Age of Onset  . Asthma Daughter   . Cancer Paternal Aunt   . Asthma Maternal Grandmother   . Hypertension Mother    History   Social History  . Marital Status: Divorced    Spouse Name: N/A    Number of Children: 1  . 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 Smoking status: Former Smoker -- 0.25 packs/day for 9 years    Types: Cigarettes  . Smokeless tobacco: Never Used  . Alcohol Use: No  . Drug Use: No  . Sexually Active: Not on file   Other Topics Concern  . 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     Review of Systems: Pertinent items are noted in HPI.  Physical Exam: Blood pressure 150/83, pulse 74, temperature 98.6 F (37 C), temperature source Oral, resp. rate 18, last menstrual period 10/04/2012, SpO2 100.00%. General: resting in bed in NAD, wearing [place] HEENT:  Mucous membranes moist Cardiac: RRR, no rubs, murmurs or gallops; upper midchest TTP Pulm: clear to auscultation bilaterally, moving normal volumes of air Abd: obese, soft, nontender, BS present Neuro: alert and oriented X3, mentating appropriately   Lab results: Basic Metabolic Panel: No results found for this basename: NA, K, CL, CO2, GLUCOSE, BUN, CREATININE, CALCIUM, MG, PHOS,  in the last 72 hours Liver Function Tests: No results found for this basename: AST, ALT, ALKPHOS, BILITOT, PROT, ALBUMIN,  in the last 72 hours No results found for this  basename: LIPASE, AMYLASE,  in the last 72 hours No results found for this basename: AMMONIA,  in the last 72 hours CBC: No results found for this basename: WBC, NEUTROABS, HGB, HCT, MCV, PLT,  in the last 72 hours Cardiac Enzymes: No results found for this basename: CKTOTAL, CKMB, CKMBINDEX, TROPONINI,  in the last 72 hours BNP: No results found for this basename:  PROBNP,  in the last 72 hours D-Dimer: No results found for this basename: DDIMER,  in the last 72 hours CBG: No results found for this basename: GLUCAP,  in the last 72 hours Hemoglobin A1C: No results found for this basename: HGBA1C,  in the last 72 hours Fasting Lipid Panel: No results found for this basename: CHOL, HDL, LDLCALC, TRIG, CHOLHDL, LDLDIRECT,  in the last 72 hours Thyroid Function Tests: No results found for this basename: TSH, T4TOTAL, FREET4, T3FREE, THYROIDAB,  in the last 72 hours Anemia Panel: No results found for this basename: VITAMINB12, FOLATE, FERRITIN, TIBC, IRON, RETICCTPCT,  in the last 72 hours Coagulation: No results found for this basename: LABPROT, INR,  in the last 72 hours Urine Drug Screen: Drugs of Abuse     Component Value Date/Time   LABOPIA NONE DETECTED 03/04/2010 1242   COCAINSCRNUR NONE DETECTED 03/04/2010 1242   LABBENZ NONE DETECTED 03/04/2010 1242   AMPHETMU NONE DETECTED 03/04/2010 1242   THCU NONE DETECTED 03/04/2010 1242   LABBARB  Value: NONE DETECTED        DRUG SCREEN FOR MEDICAL PURPOSES ONLY.  IF CONFIRMATION IS NEEDED FOR ANY PURPOSE, NOTIFY LAB WITHIN 5 DAYS.        LOWEST DETECTABLE LIMITS FOR URINE DRUG SCREEN Drug Class       Cutoff (ng/mL) Amphetamine      1000 Barbiturate      200 Benzodiazepine   200 Tricyclics       300 Opiates          300 Cocaine          300 THC              50 03/04/2010 1242    Alcohol Level: No results found for this basename: ETH,  in the last 72 hours Urinalysis: No results found for this basename: COLORURINE, APPERANCEUR, LABSPEC,  PHURINE, GLUCOSEU, HGBUR, BILIRUBINUR, KETONESUR, PROTEINUR, UROBILINOGEN, NITRITE, LEUKOCYTESUR,  in the last 72 hours  Imaging results:  No results found.   Assessment & Plan by Problem: Ms. Lightle is a 42 yo AAF with a PMH of asthma, OSA, HTN, GERD and morbid obesity presenting for shortness of breath likely due to an acute asthma exacerbation secondary to URI.  1. Acute asthma exacerbation - She has had frequent asthma exacerbations and is followed by pulmonology, Dr. Jetty Duhamel.  Her peak flows today in clinic were 125/125/200 before treatment and 130/250/100 after treatment which puts her in the category of severe asthma exacerbation (peak flow < 40% of predicted).This exacerbation is likely secondary to URI as she reports cold symptoms.  Patient does not meet SIRS criteria so will not start abx at this time.  Patient's upper chest tenderness on exam appears musculoskeletal as it was not sudden in onset and is reproducible on exam.  It is likely secondary to increased work of breathing and cough.  She also has GERD which may be contributing to to her symptoms.  After treatment patient appears near baseline. - 2L oxygen via [place], keep O2 sat > 92% - duonebs q6h  - albuterol inhaler 2 puffs q4h prn - Solumedrol 125 mg q12h - tessalon 200mg  - peak flow - EKG  2. GERD - Protonix  3. OSA - during most recent hospitalization pt was set up for sleep study which has not been completed yet.  Pt reports difficulty managing CPAP at home.   -CPAP -pt education  4. HTN - BP mildly elevated at 150/83.   -  Patient recently started on HCTZ (06/26), will continue.  5. DVT ppx - lovenox   Dispo: Disposition is deferred at this time, awaiting improvement of current medical problems. Anticipated discharge in approximately 1-2 day(s).   The patient does have a current PCP ([name], MD) and does need an [facility] hospital follow-up appointment after discharge.  The patient does not have  transportation limitations that hinder transportation to clinic appointments.  Signed: Evelena Peat, DO 11/03/2012, 12:03 AM

## 2012-11-02 NOTE — Telephone Encounter (Signed)
Pt c/o increased sob over the past month.  Pt was seen in ED 2 times since last ov on 10-02-12.  Also c/o hoarseness, chest tightness, diff sleeping and dry cough.  Pt had also called primary care clinic and she is seeing them today at 1:00. She still wanted to let Dr Maple Hudson know what has been going on with her and if he has any recommendations.  She is using Albuterol neb more frequently and has one dose of Prednisone 10 mg left.  Also using Dulera twice daily.

## 2012-11-02 NOTE — Progress Notes (Signed)
Call placed to Md requesting orders and diet order for patient.Md reported on their way over to see pt and will write orders. Pamelia Hoit

## 2012-11-02 NOTE — Telephone Encounter (Signed)
Agree with appt Thanks 

## 2012-11-02 NOTE — Telephone Encounter (Signed)
Pt calls and states she has been having problems w/ asthma for appr 1 month, she has been to the ED and was referred to either her pcp or pulmonary md. She called pulmonary and has not heard from them, she is afraid to go to sleep because she thinks she might stop breathing. She is ask to go to ED now but refuses, stating they just tell me to call you all. appt is set for this pm, dr gill

## 2012-11-02 NOTE — Assessment & Plan Note (Signed)
Acute Asthma Exacerbation: Patient has failed outpatient therapy and will be admitted to the hospital.  She received a nebulizer treatment here in the clinic with no improvement in her symptoms.

## 2012-11-02 NOTE — Progress Notes (Signed)
2nd call placed for orders as pt noting she is getting tight in the chest and needs a breathing treatment. Reviewed no orders available await Md to unit. Pamelia Hoit

## 2012-11-03 ENCOUNTER — Encounter (HOSPITAL_COMMUNITY): Payer: Self-pay | Admitting: General Practice

## 2012-11-03 ENCOUNTER — Other Ambulatory Visit: Payer: Self-pay | Admitting: Internal Medicine

## 2012-11-03 ENCOUNTER — Telehealth: Payer: Self-pay | Admitting: Internal Medicine

## 2012-11-03 ENCOUNTER — Ambulatory Visit: Payer: Commercial Managed Care - PPO | Admitting: Internal Medicine

## 2012-11-03 ENCOUNTER — Inpatient Hospital Stay (HOSPITAL_COMMUNITY): Payer: Commercial Managed Care - PPO

## 2012-11-03 DIAGNOSIS — J4551 Severe persistent asthma with (acute) exacerbation: Secondary | ICD-10-CM

## 2012-11-03 MED ORDER — MENTHOL 3 MG MT LOZG: 1.0000 | LOZENGE | OROMUCOSAL | Status: DC | PRN

## 2012-11-03 MED ORDER — HYDROCHLOROTHIAZIDE 25 MG PO TABS
25.0000 mg | ORAL_TABLET | Freq: Every day | ORAL | Status: DC
  Administered 2012-11-03: 25 mg via ORAL
  Filled 2012-11-03: qty 1

## 2012-11-03 MED ORDER — EPINEPHRINE 0.3 MG/0.3ML IJ SOAJ: 0.3000 mg | Freq: Once | INTRAMUSCULAR | Status: DC | PRN

## 2012-11-03 MED ORDER — CALCIUM CARBONATE ANTACID 500 MG PO CHEW
1.0000 | CHEWABLE_TABLET | Freq: Once | ORAL | Status: AC
  Administered 2012-11-03: 200 mg via ORAL
  Filled 2012-11-03 (×2): qty 1

## 2012-11-03 MED ORDER — AZITHROMYCIN 250 MG PO TABS: 250.0000 mg | ORAL_TABLET | Freq: Every day | ORAL | Status: AC

## 2012-11-03 MED ORDER — ALBUTEROL SULFATE HFA 108 (90 BASE) MCG/ACT IN AERS: 2.0000 | INHALATION_SPRAY | RESPIRATORY_TRACT | Status: DC | PRN

## 2012-11-03 MED ORDER — PREDNISONE 10 MG PO TABS: 10.0000 mg | ORAL_TABLET | Freq: Every day | ORAL | Status: DC

## 2012-11-03 MED ORDER — PREDNISONE 10 MG PO TABS
10.0000 mg | ORAL_TABLET | Freq: Every day | ORAL | Status: DC
  Administered 2012-11-03: 10 mg via ORAL
  Filled 2012-11-03: qty 1

## 2012-11-03 MED ORDER — AZITHROMYCIN 500 MG PO TABS
500.0000 mg | ORAL_TABLET | Freq: Every day | ORAL | Status: DC
  Administered 2012-11-03: 500 mg via ORAL
  Filled 2012-11-03: qty 1

## 2012-11-03 NOTE — Telephone Encounter (Signed)
I did speak with her physician for this admission today, suggesting they try to engage her insurance Case Manager system to help maintain contact so pt stays better controlled.

## 2012-11-03 NOTE — Telephone Encounter (Signed)
Dr Everardo Beals- Hospitalist called this AM to discuss. I suggested they involve Case Management, [facility], possibly Case management from patient's [facility].

## 2012-11-03 NOTE — Progress Notes (Signed)
Pt has refused to perform the peak flow at this time. PT says she is hot and can't breathe good so she just wants her breathing treatment. Pt has agreed to start the peak flow in the morning. RT will continue to assist as needed.

## 2012-11-03 NOTE — Progress Notes (Signed)
Subjective: Patient reports feeling better, chest tightness and shortness of breath have improved, and patient is saturating at 99% room air. Complaints of mild non productive cough. Says she is compliant with medications at home.   Objective: Vital signs in last 24 hours: Filed Vitals:   11/03/12 0840 11/03/12 0912 11/03/12 1348 11/03/12 1409  BP:    162/90  Pulse:    82  Temp:    98.3 F (36.8 C)  TempSrc:    Oral  Resp:    17  Height: 5\' 6"  (1.676 m)     Weight: 166.606 kg (367 lb 4.8 oz)     SpO2:  98% 98% 100%   General appearance: alert, cooperative and obese Head: Normocephalic, without obvious abnormality, atraumatic Lungs: clear to auscultation bilaterally Heart: regular rate and rhythm, S1, S2 normal, no murmur, click, rub or gallop Abdomen: soft, non-tender; bowel sounds normal; no masses,  no organomegaly Extremities: extremities normal, atraumatic, no cyanosis or edema. Neurologic: Alert and oriented X 3, normal strength and tone.  Lab Results: Basic Metabolic Panel:  Recent Labs Lab 10/28/12 1057  NA 139  K 4.1  CL 104  GLUCOSE 102*  BUN 15  CREATININE 1.00   CBC:  Recent Labs Lab 10/28/12 1049 10/28/12 1057  WBC 21.8*  --   NEUTROABS 16.9*  --   HGB 12.5 13.6  HCT 38.6 40.0  MCV 85.6  --   PLT 350  --    Drugs of Abuse     Component Value Date/Time   LABOPIA NONE DETECTED 03/04/2010 1242   COCAINSCRNUR NONE DETECTED 03/04/2010 1242   LABBENZ NONE DETECTED 03/04/2010 1242   AMPHETMU NONE DETECTED 03/04/2010 1242   THCU NONE DETECTED 03/04/2010 1242   LABBARB  Value: NONE DETECTED        DRUG SCREEN FOR MEDICAL PURPOSES ONLY.  IF CONFIRMATION IS NEEDED FOR ANY PURPOSE, NOTIFY LAB WITHIN 5 DAYS.        LOWEST DETECTABLE LIMITS FOR URINE DRUG SCREEN Drug Class       Cutoff (ng/mL) Amphetamine      1000 Barbiturate      200 Benzodiazepine   200 Tricyclics       300 Opiates          300 Cocaine          300 THC              50 03/04/2010 1242      Studies/Results: Dg Chest 2 View  11/03/2012   *RADIOLOGY REPORT*   IMPRESSION:  1.  Moderate bronchitic changes are likely reactive related to reactive airways disease. 2.  Asymmetric right upper lobe airspace disease.  While this may be related to atelectasis, developing infection is not excluded. Recommend follow-up chest x-ray after resolution of symptoms to assure clearing.   Original Report Authenticated By: Marin Roberts, M.D.   Medications: I have reviewed the patient's current medications. Scheduled Meds: . ipratropium  0.5 mg Nebulization Q6H   And  . albuterol  2.5 mg Nebulization Q6H  . azithromycin  500 mg Oral Daily  . enoxaparin (LOVENOX) injection  40 mg Subcutaneous Q24H  . hydrochlorothiazide  25 mg Oral Daily  . nicotine  7 mg Transdermal Daily  . pantoprazole  40 mg Oral Daily  . predniSONE  10 mg Oral Q breakfast  . sodium chloride  3 mL Intravenous Q12H   Continuous Infusions:  PRN Meds:.albuterol, benzonatate, menthol-cetylpyridinium, zolpidem  Assessment/Plan:  1. Acute asthma  exacerbation - Patient was admitted with Peak flow- <40%, She has had frequent asthma exacerbations and is followed by pulmonology, Dr. Jetty Duhamel. Current exacerbation is likely due to URI as she reports cold symptoms and patient still smokes. Chest xray done showed RUL air space dx, which may be of infectious origin or due to athelectasis, also , elevated CBC ( though patient is on steriod)  And presence of cough, 5 day course of Azithromycin will be started today. She also has GERD which may be contributing to to her symptoms. After treatment patient appears near baseline. Patient is currently saturating- 99% on room air, without complaints of dyspnea or chest tightness. -  Azithromycin- 500mg  start, and will be discharged on 250mg  for 4 days.  - duonebs q6h  - albuterol inhaler 2 puffs q4h prn  - Solumedrol 125 mg q12h to be converted to Prednisone 10 mg daily. - tessalon  200mg  - case mgt to review. - counselled about smoking cessation. 2. GERD  - Protonix  3. OSA - during most recent hospitalization pt was set up for sleep study which has not been completed yet. Pt reports difficulty managing CPAP at home.  -CPAP  -pt education  4. HTN - BP mildly elevated at 150/83.  - Patient recently started on HCTZ (06/26), will continue.  5. DVT ppx - lovenox    Dispo: Disposition is deferred at this time, awaiting improvement of current medical problems.  Anticipated discharge- today.  The patient does have a current PCP ([name], MD) and does need an [facility] hospital follow-up appointment after discharge.  The patient does not have transportation limitations that hinder transportation to clinic appointments.  .Services Needed at time of discharge: Y = Yes, Blank = No PT:   OT:   RN:   Equipment:   Other:     LOS: 1 day   Kennis Carina, MD 11/03/2012, 2:50 PM

## 2012-11-03 NOTE — Telephone Encounter (Signed)
I had spoken with Dr Everardo Beals about Ms Bloch today.

## 2012-11-03 NOTE — Care Management Note (Signed)
  Page 1 of 1   11/03/2012     2:32:39 PM   CARE MANAGEMENT NOTE 11/03/2012  Patient:  April [name], April [name]   Account Number:  [id number]  Date Initiated:  11/03/2012  Documentation initiated by:  Ronny Flurry  Subjective/Objective Assessment:     Action/Plan:   Anticipated DC Date:     Anticipated DC Plan:           Choice offered to / List presented to:             Status of service:   Medicare Important Message given?   (If response is "NO", the following Medicare IM given date fields will be blank) Date Medicare IM given:   Date Additional Medicare IM given:    Discharge Disposition:    Per UR Regulation:    If discussed at Long Length of Stay Meetings, dates discussed:    Comments:  11-03-12 Referral for medication assistance . Patient states she is going to be discharged on prednisone and antibiotics and she cannot afford these medications . Confirmed with patient that she does have prescription coverage . Patient states that " even if co pay is $2 she cannot afford it " . Explained unable to assist with co pays , confirmed with Director . Patinet aware.  Ronny Flurry RN BSN

## 2012-11-03 NOTE — Progress Notes (Signed)
Patient reported to me never having filled or used an Epi-Pen, so I deleted it at her last office visit. However, after discussion with her pulmonologist Dr. Maple Hudson, it's a good idea to have one available in the event of an anaphylactic reaction to her Xolair injections, which is rare but has been reported up to 72 hours after injections. I have reordered it so that she may get it with her next medication refill.

## 2012-11-03 NOTE — H&P (Signed)
INTERNAL MEDICINE TEACHING SERVICE Attending Admission Note  Date: 11/03/2012  Patient name: ARIBELLA VAVRA  Medical record number: [id number]  Date of birth: March 13, 1971    I have seen and evaluated [name] and discussed their care with the Residency Team.  41 yr. Old AAF w/ hx ashtma, OSA, HTN, GERD, continued tobacco abuse, morbid obesity Body mass index is 59.31 kg/(m^2)., presented as a direct admission from [facility]. She was noted to have persisting SOB and a PEF less than 40% predicted in clinic. She has been treated with nebulizer treatments and steroid treatments since admission. No cough noted. This morning, she has NO wheezing on exam, no tachypnea, no tachycardia, and no hypoxia (she has O2 sat of 100% on RA). She was able to carry on a conversation without any SOB.  Case was discussed with her pulmonologist Dr. Maple Hudson. She needs to continue on prednisone 10 mg as a maintenance dose until she sees Dr. Maple Hudson. Given the finding on CXR RUL, I would go ahead and treat this with 5 days of azithromycin, though I am not sure I would call this a pneumonia (likely bronchial markings/atelectasis), but given her asthmatic symptoms, I would just go ahead and treat. Her leukocytosis is secondary to WBC de margination.  I had a discussion with her about tobacco abuse on previous admission and she replies "I don't smoke" but then states she may have had one cigarette. I'm not sure she understands the link between asthmatic symptoms and cigarette use, though this has been explained to her multiple times. Currently, she is medically stable for D/C. She has no further need for inpatient medical services.   Jonah Blue, DO 7/8/20142:28 PM

## 2012-11-03 NOTE — Discharge Summary (Signed)
Name: LEKIA NIER MRN: [id number] DOB: Oct 11, 1970 42 y.o. PCP: [name], MD  Date of Admission: 11/02/2012  5:00 PM Date of Discharge: 11/03/2012 Attending Physician: Jonah Blue, DO  Discharge Diagnosis: Principal Problem:   Acute asthma exacerbation Active Problems:   Morbid obesity with body mass index of 50.0-59.9 in adult   TOBACCO USER   HYPERTENSION, ESSENTIAL NOS   Esophageal reflux   Sleep apnea   Insomnia  Discharge Medications:   Medication List         albuterol 108 (90 BASE) MCG/ACT inhaler  Commonly known as:  PROVENTIL HFA;VENTOLIN HFA  Inhale 2 puffs into the lungs every 4 (four) hours as needed for wheezing or shortness of breath.     albuterol (2.5 MG/3ML) 0.083% nebulizer solution  Commonly known as:  PROVENTIL  Take 2.5 mg by nebulization every 2 (two) hours as needed for wheezing or shortness of breath.     azithromycin 250 MG tablet  Commonly known as:  ZITHROMAX  Take 1 tablet (250 mg total) by mouth daily.  Start taking on:  11/04/2012     benzonatate 200 MG capsule  Commonly known as:  TESSALON  Take 1 capsule (200 mg total) by mouth 3 (three) times daily as needed for cough.     cetirizine 10 MG tablet  Commonly known as:  ZYRTEC  Take 10 mg by mouth every evening.     hydrochlorothiazide 25 MG tablet  Commonly known as:  HYDRODIURIL  Take 1 tablet (25 mg total) by mouth daily.     ibuprofen 200 MG tablet  Commonly known as:  ADVIL,MOTRIN  Take 800 mg by mouth daily as needed for pain.     mometasone-formoterol 100-5 MCG/ACT Aero  Commonly known as:  DULERA  Inhale 2 puffs into the lungs 2 (two) times daily.     nicotine 21 mg/24hr patch  Commonly known as:  NICODERM CQ - dosed in mg/24 hours  Place 1 patch onto the skin daily.     omalizumab 150 MG injection  Commonly known as:  XOLAIR  Inject 225 mg into the skin every 14 (fourteen) days.     omeprazole 20 MG capsule  Commonly known as:  PRILOSEC  Take 1 capsule (20 mg  total) by mouth 2 (two) times daily before a meal.     OVER THE COUNTER MEDICATION  Place 1 spray into both nostrils at bedtime as needed (for nausea). "Nausea spray"     predniSONE 10 MG tablet  Commonly known as:  DELTASONE  Take 1 tablet (10 mg total) by mouth daily.        Disposition and follow-up:   Ms.Barbee RODNEISHA BONNET was discharged from [facility] in Stable condition.  At the hospital follow up visit please address:  1.  Compliance with Prednisone and use of albuterol inhaler as prescribed. Patient should also complete five days of azithromycin started while on admission.  2.  PEFR- to determine patients baseline.   Discharge Instructions:     Discharge Orders   Future Appointments Provider Department Dept Phone   11/06/2012 9:00 AM [facility] [phone]   11/09/2012 9:30 AM [name], NP [facility] [phone]   04/05/2013 2:00 PM [name], MD [facility] [phone]   Future Orders Complete By Expires     Diet - low sodium heart healthy  As directed     Discharge instructions  As directed  Comments:      We are treating you for possible pneumonia and therefore placing you on Azithromycin for 4 days. Please take you prednisone- and albuterol inhaler as prescribed. Avoid smoking as this an provoke asthma attacks.    Increase activity slowly  As directed        Procedures Performed:  Dg Chest 2 View  11/03/2012   *RADIOLOGY REPORT* IMPRESSION:  1.  Moderate bronchitic changes are likely reactive related to reactive airways disease. 2.  Asymmetric right upper lobe airspace disease.  While this may be related to atelectasis, developing infection is not excluded. Recommend follow-up chest x-ray after resolution of symptoms to assure clearing.       Admission HPI: Chief Complaint: shortness of breath  History of Present Illness: Ms. Klas is a 42 yo AAF with a PMH of asthma, OSA,  HTN, GERD and morbid obesity presenting for shortness of breath. She has had frequent asthma exacerbations and is followed by pulmonology, Dr. Jetty Duhamel. Her last hospitalization for acute asthma exacerbation was 06/25-06/27/2014. She was discharged in stable condition on a Prednisone taper (40 x 2 days, 20 x 2 days, 10 until she saw [facility] in 1-2 weeks), 250/50 Advair 1 inhalation BID, albuterol inhaler 2 puffs q6h prn, duonebs q4h prn, protonix 40mg  BID, benadryl and Klonopin. She is due for a Xolair injections next week and it appears that her exacerbations occur right before she is due to receive an injection.  Today Ms. Rosen presented to clinic for SOB that began about 4 days ago. She was found to have PEF < 40% of predicted and she was admitted for inpatient management. She also reports cough, sore throat and URI symptoms as well. She says she has had to increase her nebulizer use and had to take two nebulizer treatments early this morning before presenting to the ED. She reports compliance with her home asthma meds although she feels the steroids are not helping her. She reports she is trying to reduce her tobacco use. 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    Hospital Course by problem list:  #. Acute asthma exacerbation - Patient was admitted with Peak flow- <40%, She has had frequent asthma exacerbations and has been followed by a pulmonologist, Dr. Jetty Duhamel. Current exacerbation was likely due to URI as she reported cold symptoms also patient still smokes. She also has GERD which may be a contributory factor. She was placed on Duoneds q6h, albuterol inhaler-2puffs q4h, prn, tessalon-200mg , she showed significant improvement of her symptoms . She was admitted for 1 day, on discharge, SPO2- 99% on room air, without complaints of SOB or chest tightness. - Chest xray done showed RUL air space dx, ?infectious origin or ?athelectasis, also, elevated  CBC (though patient is on steriod) and presence of cough, 5 day course of Azithromycin was started on day of discharge. - Azithromycin- 500mg  stat, and then discharged on 250mg  to be taken for 4 days.  - albuterol inhaler 2 puffs q4h prn  - Prednisone 10 mg daily.  - tessalon 200mg   - Consult to case mgt to review, to assist patient so she is compliant with her medications.  - counselled about smoking cessation. -Appointment set up with PCP tomorrow.  2. GERD  - Omeprazole  3. OSA - during most previous hospitalization pt was set up for sleep study which has not been completed yet. Pt reported difficulty managing CPAP at home.   4.  HTN - BP mildly elevated at 150/83.  - Patient recently started on HCTZ (06/26), will continue.    Discharge Vitals:   BP 164/88  Pulse 70  Temp(Src) 98.8 F (37.1 C) (Oral)  Resp 19  Ht 5\' 6"  (1.676 m)  Wt 166.606 kg (367 lb 4.8 oz)  BMI 59.31 kg/m2  SpO2 98%  LMP 10/04/2012  Signed: Kennis Carina, MD 11/03/2012, 2:04 PM   Time Spent on Discharge: 40 minutes.

## 2012-11-03 NOTE — Telephone Encounter (Signed)
Spoke with patient, patient was admitted to [facility] yesterday Per patient her primary care MDs have "been trying to get in touch with Dr. Maple Hudson to inform him of this as this is out of their comfort zone" Will forward to Dr. Maple Hudson

## 2012-11-03 NOTE — Telephone Encounter (Signed)
Called, spoke with pt.  Reports they are trying to discharge her today.  She doesn't feel like she is ready to go home.  Still having c/o chest tightness.  Pt reports if she goes home today, she feels she will end back up at the hospital.  She would like CDY to be aware of this and for his further recs.  Will route to Dr. Maple Hudson.  Pls advise.  Thank you.

## 2012-11-03 NOTE — Progress Notes (Signed)
I saw and evaluated the patient.  I personally confirmed the key portions of the history and exam documented by Dr. Delane Ginger and I reviewed pertinent patient test results.  The assessment, diagnosis, and plan were formulated together and I agree with the documentation in the resident's note.  I agree with the plan for admission.  April [name] notes that her symptoms have been uncontrolled for about a month and she has been treated in the outpatient setting without success.  Her PEF are diminished from a baseline of around 350, and did not improve with nebulizer.  She had already received the last dose of her steroid taper on the day of her clinic visit without improvement as well.  Her exacerbation is severe and warrants admission for further care.

## 2012-11-05 ENCOUNTER — Emergency Department (INDEPENDENT_AMBULATORY_CARE_PROVIDER_SITE_OTHER)
Admission: EM | Admit: 2012-11-05 | Discharge: 2012-11-05 | Disposition: A | Discharge status: Home / Self Care | Payer: Commercial Managed Care - PPO | Source: Home / Self Care

## 2012-11-05 ENCOUNTER — Encounter (HOSPITAL_COMMUNITY): Payer: Self-pay | Admitting: Emergency Medicine

## 2012-11-05 DIAGNOSIS — N390 Urinary tract infection, site not specified: Secondary | ICD-10-CM

## 2012-11-05 DIAGNOSIS — J45909 Unspecified asthma, uncomplicated: Secondary | ICD-10-CM

## 2012-11-05 DIAGNOSIS — M549 Dorsalgia, unspecified: Secondary | ICD-10-CM

## 2012-11-05 LAB — POCT URINALYSIS DIP (DEVICE)
Ketones, ur: NEGATIVE mg/dL
Protein, ur: 100 mg/dL — AB
Specific Gravity, Urine: 1.025 (ref 1.005–1.030)
pH: 6 (ref 5.0–8.0)

## 2012-11-05 MED ORDER — TRAMADOL HCL 50 MG PO TABS: 50.0000 mg | ORAL_TABLET | Freq: Four times a day (QID) | ORAL | Status: DC | PRN

## 2012-11-05 MED ORDER — CIPROFLOXACIN HCL 500 MG PO TABS: 500.0000 mg | ORAL_TABLET | Freq: Two times a day (BID) | ORAL | Status: DC

## 2012-11-05 MED ORDER — HYDROCODONE-ACETAMINOPHEN 5-325 MG PO TABS: 1.0000 | ORAL_TABLET | Freq: Four times a day (QID) | ORAL | Status: DC | PRN

## 2012-11-05 NOTE — Telephone Encounter (Signed)
Spoke with patient (patient is no longer admitted) However made her aware Dr. Maple Hudson was aware of her admission to hospital Patient voiced understanding and nothing further needed at this time

## 2012-11-05 NOTE — ED Notes (Signed)
Patient rejected tramadol saying it does not work for her.  Zack, pa changed script

## 2012-11-05 NOTE — Discharge Summary (Signed)
  Date: 11/05/2012  Patient name: April [name]  Medical record number: [id number]  Date of birth: [birth year]   This patient has been seen and the plan of care was discussed with the house staff. Please see their note for complete details. I concur with their findings and plan.  Jonah Blue, DO 11/05/2012, 3:59 PM

## 2012-11-05 NOTE — ED Notes (Signed)
Back hurts per patient .  Reports mid to lower back pain for a month and recently worsened.  Patient reports discharged from hospital on Tuesday, was admitted for asthma. Denies urinary symptoms.  Reports similar back pain in the past prior to her breast reduction

## 2012-11-05 NOTE — ED Provider Notes (Signed)
History    CSN: [id number] Arrival date & time 11/05/12  1804  None    Chief Complaint  Patient presents with  . Back Pain   (Consider location/radiation/quality/duration/timing/severity/associated sxs/prior Treatment) HPI Comments: Patient presents complaining of one month of gradually increasing lower back pain, getting much worse in the past 2-3 days. She says the pain is in the lower back bilaterally and is made worse by any bending or twisting motions. She has tried taking numerous over-the-counter medications for this which she states are not helping whatsoever. She notes she was recently in the hospital for asthma exacerbation but this is now feeling 100% better and patient denies any shortness of breath today. She also denies fever, chills, hematuria, dysuria, change in bowel or bladder control. She has had pain similar to this before prior to her breast reduction surgery one year ago.  Patient is a 42 y.o. female presenting with back pain.  Back Pain Associated symptoms: no abdominal pain, no chest pain, no dysuria, no fever and no weakness    Past Medical History  Diagnosis Date  . Acanthosis nigricans   . Menorrhagia   . Hypertension, essential   . Insomnia   . Morbid obesity   . Helicobacter pylori (H. pylori) infection   . Sleep apnea     Sleep study 2008 - mild OSA, not enough events to titrate CPAP  . COPD (chronic obstructive pulmonary disease)     PFTs in 2002, FEV1/FVC 65, no post bronchodilater test done  . Asthma     Followed by Dr. Maple Hudson (pulmonology); receives every other week omalizumab injections; has frequent exacerbations  . Depression   . GERD (gastroesophageal reflux disease)   . Tobacco user   . Obesity    Past Surgical History  Procedure Laterality Date  . Tubal ligation  1996    bilateral  . Breast reduction surgery  09/2011   Family History  Problem Relation Age of Onset  . Asthma Daughter   . Cancer Paternal Aunt   . Asthma Maternal  Grandmother   . Hypertension Mother    History  Substance Use Topics  . Smoking status: Former Smoker -- 0.25 packs/day for 9 years    Types: Cigarettes    Quit date: 10/04/2012  . Smokeless tobacco: Never Used  . Alcohol Use: No   OB History   Grav Para Term Preterm Abortions TAB SAB Ect Mult Living                 Review of Systems  Constitutional: Negative for fever and chills.  Eyes: Negative for visual disturbance.  Respiratory: Negative for cough and shortness of breath.   Cardiovascular: Negative for chest pain, palpitations and leg swelling.  Gastrointestinal: Negative for nausea, vomiting and abdominal pain.  Endocrine: Negative for polydipsia and polyuria.  Genitourinary: Negative for dysuria, urgency and frequency.  Musculoskeletal: Positive for back pain. Negative for myalgias and arthralgias.  Skin: Negative for rash.  Neurological: Negative for dizziness, weakness and light-headedness.    Allergies  Ace inhibitors and Percocet  Home Medications   Current Outpatient Rx  Name  Route  Sig  Dispense  Refill  . albuterol (PROVENTIL HFA;VENTOLIN HFA) 108 (90 BASE) MCG/ACT inhaler   Inhalation   Inhale 2 puffs into the lungs every 4 (four) hours as needed for wheezing or shortness of breath.   6.7 g   5   . albuterol (PROVENTIL) (2.5 MG/3ML) 0.083% nebulizer solution   Nebulization   Take  2.5 mg by nebulization every 2 (two) hours as needed for wheezing or shortness of breath.          [name] azithromycin (ZITHROMAX) 250 MG tablet   Oral   Take 1 tablet (250 mg total) by mouth daily.   4 each   0   . benzonatate (TESSALON) 200 MG capsule   Oral   Take 1 capsule (200 mg total) by mouth 3 (three) times daily as needed for cough.   20 capsule   0   . cetirizine (ZYRTEC) 10 MG tablet   Oral   Take 10 mg by mouth every evening.         . ciprofloxacin (CIPRO) 500 MG tablet   Oral   Take 1 tablet (500 mg total) by mouth 2 (two) times daily.   14  tablet   0   . EPINEPHrine (EPIPEN) 0.3 mg/0.3 mL SOAJ   Subcutaneous   Inject 0.3 mLs (0.3 mg total) into the skin once as needed.   1 Device   6   . hydrochlorothiazide (HYDRODIURIL) 25 MG tablet   Oral   Take 1 tablet (25 mg total) by mouth daily.   30 tablet   11   . HYDROcodone-acetaminophen (NORCO) 5-325 MG per tablet   Oral   Take 1 tablet by mouth every 6 (six) hours as needed for pain.   15 tablet   0   . ibuprofen (ADVIL,MOTRIN) 200 MG tablet   Oral   Take 800 mg by mouth daily as needed for pain.         . mometasone-formoterol (DULERA) 100-5 MCG/ACT AERO   Inhalation   Inhale 2 puffs into the lungs 2 (two) times daily.         . nicotine (NICODERM CQ - DOSED IN MG/24 HOURS) 21 mg/24hr patch   Transdermal   Place 1 patch onto the skin daily.   28 patch   0   . omalizumab (XOLAIR) 150 MG injection   Subcutaneous   Inject 225 mg into the skin every 14 (fourteen) days.         [name] omeprazole (PRILOSEC) 20 MG capsule   Oral   Take 1 capsule (20 mg total) by mouth 2 (two) times daily before a meal.   30 capsule   12   . OVER THE COUNTER MEDICATION   Each Nare   Place 1 spray into both nostrils at bedtime as needed (for nausea). "Nausea spray"         . predniSONE (DELTASONE) 10 MG tablet   Oral   Take 1 tablet (10 mg total) by mouth daily.   30 tablet   0    BP 146/90  Pulse 70  Temp(Src) 98.5 F (36.9 C) (Oral)  Resp 16  SpO2 100%  LMP 10/04/2012 Physical Exam  Nursing note and vitals reviewed. Constitutional: She is oriented to person, place, and time. Vital signs are normal. She appears well-developed and well-nourished. No distress.  Morbidly obese habitus   HENT:  Head: Atraumatic.  Eyes: EOM are normal. Pupils are equal, round, and reactive to light.  Cardiovascular: Normal rate, regular rhythm and normal heart sounds.  Exam reveals no gallop and no friction rub.   No murmur heard. Pulmonary/Chest: Effort normal. No respiratory  distress. She has wheezes (mild expiratory wheezes throughout). She has no rales.  Abdominal: Soft. There is no tenderness. There is CVA tenderness.  Musculoskeletal:       Lumbar back: She exhibits  decreased range of motion, tenderness and bony tenderness. She exhibits no swelling, no edema, no deformity, no laceration and no spasm.  Neurological: She is alert and oriented to person, place, and time. She has normal strength.  Skin: Skin is warm and dry. She is not diaphoretic.  Psychiatric: She has a normal mood and affect. Her behavior is normal. Judgment normal.    ED Course  Procedures (including critical care time) Labs Reviewed  POCT URINALYSIS DIP (DEVICE) - Abnormal; Notable for the following:    Glucose, UA 100 (*)    Bilirubin Urine SMALL (*)    Hgb urine dipstick LARGE (*)    Protein, ur 100 (*)    Urobilinogen, UA 2.0 (*)    Leukocytes, UA TRACE (*)    All other components within normal limits  URINE CULTURE   No results found. 1. Back pain   2. UTI (lower urinary tract infection)     MDM  The urinalysis indicates there is a urinary tract infection. Sending a urine culture and starting her on Cipro for one week for complicated UTI given recent hospitalization. Follow up with the primary care physician in one week   Meds ordered this encounter  Medications  . ciprofloxacin (CIPRO) 500 MG tablet    Sig: Take 1 tablet (500 mg total) by mouth 2 (two) times daily.    Dispense:  14 tablet    Refill:  0  . DISCONTD: traMADol (ULTRAM) 50 MG tablet    Sig: Take 1 tablet (50 mg total) by mouth every 6 (six) hours as needed for pain.    Dispense:  15 tablet    Refill:  0  . HYDROcodone-acetaminophen (NORCO) 5-325 MG per tablet    Sig: Take 1 tablet by mouth every 6 (six) hours as needed for pain.    Dispense:  15 tablet    Refill:  0     Graylon Good, PA-C 11/05/12 1945  Graylon Good, PA-C 11/12/12 [phone]

## 2012-11-06 ENCOUNTER — Ambulatory Visit (INDEPENDENT_AMBULATORY_CARE_PROVIDER_SITE_OTHER): Payer: Commercial Managed Care - PPO

## 2012-11-06 DIAGNOSIS — J45909 Unspecified asthma, uncomplicated: Secondary | ICD-10-CM

## 2012-11-06 LAB — URINE CULTURE

## 2012-11-06 MED ORDER — OMALIZUMAB 150 MG ~~LOC~~ SOLR
225.0000 mg | Freq: Once | SUBCUTANEOUS | Status: AC
  Administered 2012-11-06: 225 mg via SUBCUTANEOUS

## 2012-11-09 ENCOUNTER — Ambulatory Visit: Payer: Commercial Managed Care - PPO | Admitting: Internal Medicine

## 2012-11-09 ENCOUNTER — Ambulatory Visit (INDEPENDENT_AMBULATORY_CARE_PROVIDER_SITE_OTHER)
Admission: RE | Admit: 2012-11-09 | Discharge: 2012-11-09 | Disposition: A | Discharge status: Home / Self Care | Payer: Commercial Managed Care - PPO | Source: Ambulatory Visit | Attending: Adult Health | Admitting: Adult Health

## 2012-11-09 ENCOUNTER — Encounter: Payer: Self-pay | Admitting: Adult Health

## 2012-11-09 ENCOUNTER — Ambulatory Visit (INDEPENDENT_AMBULATORY_CARE_PROVIDER_SITE_OTHER): Payer: Commercial Managed Care - PPO | Admitting: Adult Health

## 2012-11-09 VITALS — BP 126/84 | HR 97 | Temp 98.6°F | Ht 66.0 in | Wt 361.8 lb

## 2012-11-09 DIAGNOSIS — J45901 Unspecified asthma with (acute) exacerbation: Secondary | ICD-10-CM

## 2012-11-09 MED ORDER — ALBUTEROL SULFATE HFA 108 (90 BASE) MCG/ACT IN AERS: 2.0000 | INHALATION_SPRAY | RESPIRATORY_TRACT | Status: DC | PRN

## 2012-11-09 MED ORDER — MOMETASONE FURO-FORMOTEROL FUM 100-5 MCG/ACT IN AERO: 2.0000 | INHALATION_SPRAY | Freq: Two times a day (BID) | RESPIRATORY_TRACT | Status: DC

## 2012-11-09 MED ORDER — ALBUTEROL SULFATE (2.5 MG/3ML) 0.083% IN NEBU: 2.5000 mg | INHALATION_SOLUTION | RESPIRATORY_TRACT | Status: DC | PRN

## 2012-11-09 NOTE — Patient Instructions (Addendum)
Continue on current regimen.  Great job on not smoking.  Remain off prednisone .  follow up Dr. Maple Hudson  In 4 weeks and As needed

## 2012-11-09 NOTE — Assessment & Plan Note (Signed)
Recent exacerbation w/ hospital admit  cXR today with no acute process   Plan  Continue on current regimen.  Great job on not smoking.  Remain off prednisone .  follow up Dr. Maple Hudson  In 4 weeks and As needed

## 2012-11-09 NOTE — Progress Notes (Signed)
Patient ID: April [name], female    DOB: 21-Jun-1970, 42 y.o.   MRN: [id number]  HPI 33 yoF former smoker followed for chronic severe asthma. Hospitalized 4/11-12/12 for another exacerbation she says was triggered by catching a cold. Common triggers include colds and any smoke exposure. Back now to baseline. She has continued Xolair injection here every 2 weeks and does feel it has reduced frequency of flare-ups. Usual weight range 320-340 and she asks why weight is up to 360. Discussed steroids, including IV therapy at hospital but also ?role of Dulera 200-5. She is now being seen a [facility] anticipating breast reduction surgery and we talked about bariatric surgery. Takes omeprazole twice daily before meals, but has frequent heart burn.   04/12/11- 41 yoF former smoker followed for chronic severe asthma. Has had flu vaccine and pertussis vaccine. Says her breathing is okay. We had sent antibiotic for a cold with bronchitis around Thanksgiving. She denies any smoking at all now. She has avoided hospital partly because she has been better and partly because she is unwilling to go. Xolair has made a real improvement. She had breast reduction surgery without problems at [facility] and has considered bariatric surgery but can't afford it. Uses her nebulizer machine but not every day. Does continue Dulera and omeprazole. She is taking Ambien but still sleeps poorly at night. She no longer misses much work but that means she can't nap in the daytime. Complains of frequently waking at night but does not know if she snores.  04/25/11-  41 yoF former smoker followed for chronic severe asthma complicated by morbid obesity, HBP, depression. Her antidepressant was changed to Wellbutrin on December 18. Since then she can't sleep, using 3 Ambien per night. Increased asthma with wheezing. She increased to [facility] to 6 puffs daily. Had a cold at Thanksgiving but not since. She continues Xolair.  08/06/11- 53 yoF former  smoker followed for chronic severe asthma complicated by morbid obesity, HBP, depression, GERD. Change jobs and insurance. Had a lapse of 2 weeks in her Xolair therapy but ready to restart. Work environment is clear and cool, indoors. Watery rhinorrhea not helped by 3 intermittent use of Flonase. No acute asthma attack but uses her Dulera and rescue inhaler every night at bedtime. Ambien helps sleep.  10/02/12- 38 yoF former smoker followed for chronic severe asthma complicated by morbid obesity, HBP, depression, GERD. FOLLOWS FOR: had a few hosptial visits recently due to insurance and PA for Leandro Reasoner to have medication on Tuesday 10-06-12 and can resume Xolair then. Continues to have trouble sleeping (falling and staying) and would like to go back on Ambien if possible. Throat itch x several days, dry cough. ER again 2 weeks ago. Off Xolair x 5 weeks but has insurance ok now to restart. She started some left over prednisone. Aware occasional reflux- discussed precautions. CXR 05/22/12 IMPRESSION:  No acute infiltrate or pulmonary edema. Mild perihilar  increased bronchial markings without focal consolidation.  Original Report Authenticated By: Natasha Mead, M.D.   11/09/2012 Post Hospital follow up  Recent admitted for Acute asthma flare. Tx w/ abx , steroids and nebs. Reports doing well since discharge.  finished prednisone and abx from hosp but currently on Cipro for UTI.  no new complaints; reports breathing is back to baseline.   Had restarted smoking but has quit . We discussed med compliance. No fever, chest pain, orthopnea, hemoptysis.  No edema.   Review of Systems-See HPI Constitutional:   No-  weight loss, night sweats, fevers, chills, +fatigue, lassitude. HEENT:   No-  headaches, difficulty swallowing, tooth/dental problems, sore throat,       + sneezing, itching, ear ache, nasal congestion, post nasal drip,  CV:  No-   chest pain, orthopnea, PND, swelling in lower extremities,  anasarca, dizziness, palpitations Resp: No-acute shortness of breath with exertion or at rest.              No-   productive cough,  N+ non-productive cough,  No- coughing up of blood.              No-   change in color of mucus. wheezing.   Skin: No-   rash or lesions. GI:  No-   heartburn, indigestion, abdominal pain, nausea, vomiting,  GU: MS:  No-   joint pain or swelling.   Neuro-     nothing unusual Psych:  No- change in mood or affect. No depression or anxiety.  No memory loss.    Objective:   Physical Exam General- Alert, Oriented, Affect-appropriate, Distress- none acute, + morbid obesity,  Skin- rash-none, lesions- none, excoriation- none Lymphadenopathy- none Head- atraumatic            Eyes- Gross vision intact, PERRLA, conjunctivae clear secretions            Ears- Hearing, canals-normal            Nose- Clear, no-Septal dev, mucus, polyps, erosion, perforation             Throat- Mallampati II , mucosa clear , drainage- none, tonsils- atrophic Neck- flexible , trachea midline, no stridor , thyroid nl, carotid no bruit Chest - symmetrical excursion , unlabored           Heart/CV- RRR , no murmur , no gallop  , no rub, nl s1 s2                           - JVD- none , edema- none, stasis changes- none, varices- none           Lung- Diminished, unlabored. No wheeze,no cough, dullness-none, rub- none           Chest wall-  Abd-  Br/ Gen/ Rectal- Not done, not indicated Extrem- cyanosis- none, clubbing, none, atrophy- none, strength- nl Neuro- grossly intact to observation   11/09/2012 CXR Stable, with suspected artifactual density in the medial right  upper lung related to summation. No acute cardiopulmonary  abnormality.

## 2012-11-12 ENCOUNTER — Encounter: Payer: Commercial Managed Care - PPO | Admitting: Internal Medicine

## 2012-11-14 NOTE — ED Provider Notes (Signed)
Medical screening examination/treatment/procedure(s) were performed by resident physician or non-physician practitioner and as supervising physician I was immediately available for consultation/collaboration.   Barkley Bruns MD.   Linna Hoff, MD 11/14/12 [phone]

## 2012-11-20 ENCOUNTER — Ambulatory Visit: Payer: Commercial Managed Care - PPO

## 2012-11-23 ENCOUNTER — Ambulatory Visit (INDEPENDENT_AMBULATORY_CARE_PROVIDER_SITE_OTHER): Payer: Commercial Managed Care - PPO

## 2012-11-23 ENCOUNTER — Telehealth: Payer: Self-pay | Admitting: Internal Medicine

## 2012-11-23 DIAGNOSIS — J45909 Unspecified asthma, uncomplicated: Secondary | ICD-10-CM

## 2012-11-23 NOTE — Telephone Encounter (Signed)
lmtcb x1 for pt--pt has a rescue inahler sent to her local pharmacy on 11/12/12

## 2012-11-24 NOTE — Telephone Encounter (Signed)
ATC patient-- female answered the phone asked whos calling, once I informed it was nurse calling from Dr. Sinclair Ship office phone disconnected ATC patient a second time the phone rang once and then did a fast busy signal Will await return call and/or [facility]

## 2012-11-25 NOTE — Telephone Encounter (Signed)
lmomtcb on above # ATC # listed as pt's home # - [phone] - received msg that is # is "not valid"

## 2012-11-26 MED ORDER — OMALIZUMAB 150 MG ~~LOC~~ SOLR
225.0000 mg | Freq: Once | SUBCUTANEOUS | Status: AC
  Administered 2012-11-26: 225 mg via SUBCUTANEOUS

## 2012-11-26 NOTE — Telephone Encounter (Signed)
Pt advised no samples available but rx was sent. Carron Curie, CMA

## 2012-12-02 ENCOUNTER — Telehealth: Payer: Self-pay | Admitting: Internal Medicine

## 2012-12-02 NOTE — Telephone Encounter (Signed)
Called, spoke with pt: 1.  Requesting sample of albuterol hfa - no samples available right now.  Pt aware. 2.  States she was told her April [name] is coming in this week.  She would like to know if it is still going to be here.  Florentina Addison, can you please help with this?

## 2012-12-03 NOTE — Telephone Encounter (Signed)
Xolair to be delivered on 12-04-12; appt on Monday 12-07-12. Pt aware of appt.

## 2012-12-07 ENCOUNTER — Ambulatory Visit (INDEPENDENT_AMBULATORY_CARE_PROVIDER_SITE_OTHER): Payer: Commercial Managed Care - PPO

## 2012-12-07 ENCOUNTER — Encounter: Payer: Commercial Managed Care - PPO | Admitting: Internal Medicine

## 2012-12-07 DIAGNOSIS — J45909 Unspecified asthma, uncomplicated: Secondary | ICD-10-CM

## 2012-12-10 MED ORDER — OMALIZUMAB 150 MG ~~LOC~~ SOLR
225.0000 mg | Freq: Once | SUBCUTANEOUS | Status: AC
  Administered 2012-12-10: 225 mg via SUBCUTANEOUS

## 2012-12-11 ENCOUNTER — Ambulatory Visit: Payer: Commercial Managed Care - PPO | Admitting: Internal Medicine

## 2012-12-21 ENCOUNTER — Ambulatory Visit: Payer: Commercial Managed Care - PPO

## 2012-12-22 ENCOUNTER — Encounter (HOSPITAL_COMMUNITY): Payer: Self-pay | Admitting: *Deleted

## 2012-12-22 ENCOUNTER — Emergency Department (HOSPITAL_COMMUNITY)
Admission: EM | Admit: 2012-12-22 | Discharge: 2012-12-22 | Disposition: A | Discharge status: Home / Self Care | Payer: Commercial Managed Care - PPO | Attending: Emergency Medicine | Admitting: Emergency Medicine

## 2012-12-22 ENCOUNTER — Emergency Department (HOSPITAL_COMMUNITY): Payer: Commercial Managed Care - PPO

## 2012-12-22 ENCOUNTER — Telehealth: Payer: Self-pay | Admitting: *Deleted

## 2012-12-22 DIAGNOSIS — R112 Nausea with vomiting, unspecified: Secondary | ICD-10-CM | POA: Insufficient documentation

## 2012-12-22 DIAGNOSIS — I10 Essential (primary) hypertension: Secondary | ICD-10-CM | POA: Insufficient documentation

## 2012-12-22 DIAGNOSIS — K219 Gastro-esophageal reflux disease without esophagitis: Secondary | ICD-10-CM | POA: Insufficient documentation

## 2012-12-22 DIAGNOSIS — Z8742 Personal history of other diseases of the female genital tract: Secondary | ICD-10-CM | POA: Insufficient documentation

## 2012-12-22 DIAGNOSIS — R109 Unspecified abdominal pain: Secondary | ICD-10-CM

## 2012-12-22 DIAGNOSIS — Z872 Personal history of diseases of the skin and subcutaneous tissue: Secondary | ICD-10-CM | POA: Insufficient documentation

## 2012-12-22 DIAGNOSIS — R079 Chest pain, unspecified: Secondary | ICD-10-CM | POA: Insufficient documentation

## 2012-12-22 DIAGNOSIS — J441 Chronic obstructive pulmonary disease with (acute) exacerbation: Secondary | ICD-10-CM | POA: Insufficient documentation

## 2012-12-22 DIAGNOSIS — R0682 Tachypnea, not elsewhere classified: Secondary | ICD-10-CM | POA: Insufficient documentation

## 2012-12-22 DIAGNOSIS — Z87891 Personal history of nicotine dependence: Secondary | ICD-10-CM | POA: Insufficient documentation

## 2012-12-22 DIAGNOSIS — Z8619 Personal history of other infectious and parasitic diseases: Secondary | ICD-10-CM | POA: Insufficient documentation

## 2012-12-22 DIAGNOSIS — K5732 Diverticulitis of large intestine without perforation or abscess without bleeding: Secondary | ICD-10-CM | POA: Insufficient documentation

## 2012-12-22 DIAGNOSIS — Z8659 Personal history of other mental and behavioral disorders: Secondary | ICD-10-CM | POA: Insufficient documentation

## 2012-12-22 DIAGNOSIS — Z3202 Encounter for pregnancy test, result negative: Secondary | ICD-10-CM | POA: Insufficient documentation

## 2012-12-22 LAB — CBC
Platelets: 348 10*3/uL (ref 150–400)
RBC: 4.27 MIL/uL (ref 3.87–5.11)
WBC: 15.4 10*3/uL — ABNORMAL HIGH (ref 4.0–10.5)

## 2012-12-22 LAB — POCT I-STAT TROPONIN I: Troponin i, poc: 0 ng/mL (ref 0.00–0.08)

## 2012-12-22 LAB — URINALYSIS, ROUTINE W REFLEX MICROSCOPIC
Glucose, UA: NEGATIVE mg/dL
Leukocytes, UA: NEGATIVE
Protein, ur: NEGATIVE mg/dL
pH: 6 (ref 5.0–8.0)

## 2012-12-22 LAB — BASIC METABOLIC PANEL
CO2: 27 mEq/L (ref 19–32)
Chloride: 103 mEq/L (ref 96–112)
GFR calc Af Amer: >90 mL/min (ref >90)
Sodium: 138 mEq/L (ref 135–145)

## 2012-12-22 LAB — HEPATIC FUNCTION PANEL
AST: 13 U/L (ref 0–37)
Bilirubin, Direct: <0.1 mg/dL (ref 0.0–0.3)

## 2012-12-22 LAB — POCT PREGNANCY, URINE: Preg Test, Ur: NEGATIVE

## 2012-12-22 LAB — LIPASE, BLOOD: Lipase: 17 U/L (ref 11–59)

## 2012-12-22 MED ORDER — CIPROFLOXACIN HCL 500 MG PO TABS: 500.0000 mg | ORAL_TABLET | Freq: Two times a day (BID) | ORAL | Status: DC

## 2012-12-22 MED ORDER — FENTANYL CITRATE 0.05 MG/ML IJ SOLN
50.0000 ug | Freq: Once | INTRAMUSCULAR | Status: AC
  Administered 2012-12-22: 50 ug via INTRAVENOUS

## 2012-12-22 MED ORDER — HYDROCODONE-ACETAMINOPHEN 5-325 MG PO TABS: 1.0000 | ORAL_TABLET | ORAL | Status: DC | PRN

## 2012-12-22 MED ORDER — PROMETHAZINE HCL 25 MG PO TABS: 25.0000 mg | ORAL_TABLET | Freq: Four times a day (QID) | ORAL | Status: DC | PRN

## 2012-12-22 MED ORDER — ALBUTEROL SULFATE HFA 108 (90 BASE) MCG/ACT IN AERS
2.0000 | INHALATION_SPRAY | RESPIRATORY_TRACT | Status: DC | PRN
  Administered 2012-12-22: 2 via RESPIRATORY_TRACT
  Filled 2012-12-22: qty 6.7

## 2012-12-22 MED ORDER — METRONIDAZOLE 500 MG PO TABS: 500.0000 mg | ORAL_TABLET | Freq: Three times a day (TID) | ORAL | Status: DC

## 2012-12-22 MED ORDER — ONDANSETRON HCL 4 MG/2ML IJ SOLN
4.0000 mg | Freq: Once | INTRAMUSCULAR | Status: AC
  Administered 2012-12-22: 4 mg via INTRAVENOUS
  Filled 2012-12-22: qty 2

## 2012-12-22 MED ORDER — FENTANYL CITRATE 0.05 MG/ML IJ SOLN
50.0000 ug | Freq: Once | INTRAMUSCULAR | Status: AC
  Administered 2012-12-22: 50 ug via INTRAVENOUS
  Filled 2012-12-22: qty 2

## 2012-12-22 MED ORDER — METRONIDAZOLE 500 MG PO TABS
500.0000 mg | ORAL_TABLET | Freq: Once | ORAL | Status: AC
  Administered 2012-12-22: 500 mg via ORAL
  Filled 2012-12-22: qty 1

## 2012-12-22 MED ORDER — IOHEXOL 300 MG/ML  SOLN
100.0000 mL | Freq: Once | INTRAMUSCULAR | Status: AC | PRN
  Administered 2012-12-22: 100 mL via INTRAVENOUS

## 2012-12-22 MED ORDER — HYDROCODONE-ACETAMINOPHEN 5-325 MG PO TABS
2.0000 | ORAL_TABLET | Freq: Once | ORAL | Status: AC
  Administered 2012-12-22: 2 via ORAL
  Filled 2012-12-22: qty 2

## 2012-12-22 MED ORDER — SODIUM CHLORIDE 0.9 % IV BOLUS (SEPSIS)
1000.0000 mL | Freq: Once | INTRAVENOUS | Status: AC
  Administered 2012-12-22: 1000 mL via INTRAVENOUS

## 2012-12-22 MED ORDER — CIPROFLOXACIN HCL 500 MG PO TABS
500.0000 mg | ORAL_TABLET | Freq: Once | ORAL | Status: AC
  Administered 2012-12-22: 500 mg via ORAL
  Filled 2012-12-22: qty 1

## 2012-12-22 NOTE — ED Notes (Signed)
CT paged. 

## 2012-12-22 NOTE — Telephone Encounter (Signed)
Pt calls c/o pain for several days, she was offered an appt but states she does not have transportation and must call her daughter to see when she can come for an appt, states she thinks she will need to go to ED and says goodbye

## 2012-12-22 NOTE — ED Notes (Signed)
Pt reports mid chest pains since last night, radiates all the way down into her abd, having n/v, cough and headache.

## 2012-12-22 NOTE — ED Provider Notes (Signed)
CSN: [id number]     Arrival date & time 12/22/12  1635 History   First MD Initiated Contact with Patient 12/22/12 1836     Chief Complaint  Patient presents with  . Chest Pain  . Abdominal Pain   (Consider location/radiation/quality/duration/timing/severity/associated sxs/prior Treatment) Patient is a 42 y.o. female presenting with chest pain and abdominal pain. The history is provided by the patient and medical records. No language interpreter was used.  Chest Pain Associated symptoms: abdominal pain, nausea and vomiting   Associated symptoms: no back pain, no cough, no diaphoresis, no fatigue, no fever, no headache and no shortness of breath   Abdominal Pain Associated symptoms: chest pain, nausea and vomiting   Associated symptoms: no constipation, no cough, no diarrhea, no dysuria, no fatigue, no fever, no hematuria and no shortness of breath     April [name] is a 42 y.o. female  with a hx of asthma, HTN, sleep apnea, COPD, morbid obesity  presents to the Emergency Department complaining of gradual, intermittent, progressively worsening lower abdominal pain beginning one month ago. She states that it acutely worsened last night and is now in the epigastrium and radiating into her chest. She states the pain comes in waves like labor pains. She's had 4 episodes of emesis today.  She cannot account for if the emesis is bloody or bilious. Lying on her stomach makes it better and nothing makes it worse.  Pt denies fever, chills, headache, neck pain, constipation, diarrhea, vaginal discharge or vaginal bleeding, dysuria, hematuria.  She reports last menstrual cycle was 2 weeks ago.  Upon questioning she states that she has a history of asthma for which she takes albuterol MDI as well as dulera and has chronic wheezing..     Past Medical History  Diagnosis Date  . Acanthosis nigricans   . Menorrhagia   . Hypertension, essential   . Insomnia   . Morbid obesity   . Helicobacter pylori (H.  pylori) infection   . Sleep apnea     Sleep study 2008 - mild OSA, not enough events to titrate CPAP  . COPD (chronic obstructive pulmonary disease)     PFTs in 2002, FEV1/FVC 65, no post bronchodilater test done  . Asthma     Followed by Dr. Maple Hudson (pulmonology); receives every other week omalizumab injections; has frequent exacerbations  . Depression   . GERD (gastroesophageal reflux disease)   . Tobacco user   . Obesity    Past Surgical History  Procedure Laterality Date  . Tubal ligation  1996    bilateral  . Breast reduction surgery  09/2011   Family History  Problem Relation Age of Onset  . Asthma Daughter   . Cancer Paternal Aunt   . Asthma Maternal Grandmother   . Hypertension Mother    History  Substance Use Topics  . Smoking status: Former Smoker -- 0.25 packs/day for 9 years    Types: Cigarettes    Quit date: 10/04/2012  . Smokeless tobacco: Never Used  . Alcohol Use: No   OB History   Grav Para Term Preterm Abortions TAB SAB Ect Mult Living                 Review of Systems  Constitutional: Negative for fever, diaphoresis, appetite change, fatigue and unexpected weight change.  HENT: Negative for mouth sores and neck stiffness.   Eyes: Negative for visual disturbance.  Respiratory: Positive for wheezing. Negative for cough, chest tightness and shortness of breath.  Cardiovascular: Positive for chest pain.  Gastrointestinal: Positive for nausea, vomiting and abdominal pain. Negative for diarrhea and constipation.  Endocrine: Negative for polydipsia, polyphagia and polyuria.  Genitourinary: Negative for dysuria, urgency, frequency and hematuria.  Musculoskeletal: Negative for back pain.  Skin: Negative for rash.  Allergic/Immunologic: Negative for immunocompromised state.  Neurological: Negative for syncope, light-headedness and headaches.  Hematological: Does not bruise/bleed easily.  Psychiatric/Behavioral: Negative for sleep disturbance. The patient is  not nervous/anxious.     Allergies  Ace inhibitors and Percocet  Home Medications   Current Outpatient Rx  Name  Route  Sig  Dispense  Refill  . albuterol (PROVENTIL HFA;VENTOLIN HFA) 108 (90 BASE) MCG/ACT inhaler   Inhalation   Inhale 2 puffs into the lungs every 4 (four) hours as needed for wheezing or shortness of breath.   6.7 g   5   . EPINEPHrine (EPIPEN) 0.3 mg/0.3 mL SOAJ   Subcutaneous   Inject 0.3 mLs (0.3 mg total) into the skin once as needed.   1 Device   6   . hydrochlorothiazide (HYDRODIURIL) 25 MG tablet   Oral   Take 1 tablet (25 mg total) by mouth daily.   30 tablet   11   . mometasone-formoterol (DULERA) 100-5 MCG/ACT AERO   Inhalation   Inhale 2 puffs into the lungs 2 (two) times daily.   13 g   5   . omalizumab (XOLAIR) 150 MG injection   Subcutaneous   Inject 225 mg into the skin every 14 (fourteen) days.         [name] omeprazole (PRILOSEC) 20 MG capsule   Oral   Take 1 capsule (20 mg total) by mouth 2 (two) times daily before a meal.   30 capsule   12   . ciprofloxacin (CIPRO) 500 MG tablet   Oral   Take 1 tablet (500 mg total) by mouth every 12 (twelve) hours.   20 tablet   0   . HYDROcodone-acetaminophen (NORCO/VICODIN) 5-325 MG per tablet   Oral   Take 1-2 tablets by mouth every 4 (four) hours as needed for pain.   15 tablet   0   . metroNIDAZOLE (FLAGYL) 500 MG tablet   Oral   Take 1 tablet (500 mg total) by mouth 3 (three) times daily.   30 tablet   0   . promethazine (PHENERGAN) 25 MG tablet   Oral   Take 1 tablet (25 mg total) by mouth every 6 (six) hours as needed for nausea.   12 tablet   0    BP 142/67  Pulse 93  Temp(Src) 98.2 F (36.8 C) (Oral)  Resp 20  SpO2 100%  LMP 12/08/2012 Physical Exam  Nursing note and vitals reviewed. Constitutional: She is oriented to person, place, and time. She appears well-developed and well-nourished.  HENT:  Head: Normocephalic and atraumatic.  Mouth/Throat: Oropharynx is  clear and moist.  Eyes: Conjunctivae are normal. Pupils are equal, round, and reactive to light. No scleral icterus.  Neck: Normal range of motion.  Cardiovascular: Normal rate, regular rhythm, normal heart sounds and intact distal pulses.   No murmur heard. Pulmonary/Chest: No accessory muscle usage. Tachypnea noted. No respiratory distress. She has decreased breath sounds. She has wheezes. She has no rhonchi. She has no rales.  Patient with decreased breath sounds throughout and wheezes throughout  Abdominal: Soft. Bowel sounds are normal. She exhibits no distension and no mass. There is tenderness in the right lower quadrant, suprapubic area and  left lower quadrant. There is guarding. There is no rigidity and no rebound.  Obese abdomen Patient more tender to palpation in the suprapubic area and left lower quadrant; but diffusely tender throughout  Musculoskeletal: Normal range of motion. She exhibits no tenderness.  Lymphadenopathy:    She has no cervical adenopathy.  Neurological: She is alert and oriented to person, place, and time. She exhibits normal muscle tone. Coordination normal.  Skin: Skin is warm and dry. No rash noted. No erythema.  Psychiatric: She has a normal mood and affect.    ED Course  Procedures (including critical care time) Labs Review Labs Reviewed  CBC - Abnormal; Notable for the following:    WBC 15.4 (*)    HCT 35.8 (*)    All other components within normal limits  BASIC METABOLIC PANEL - Abnormal; Notable for the following:    Glucose, Bld 100 (*)    All other components within normal limits  URINALYSIS, ROUTINE W REFLEX MICROSCOPIC - Abnormal; Notable for the following:    Color, Urine AMBER (*)    APPearance CLOUDY (*)    All other components within normal limits  HEPATIC FUNCTION PANEL - Abnormal; Notable for the following:    Albumin 2.9 (*)    All other components within normal limits  WET PREP, GENITAL  GC/CHLAMYDIA PROBE AMP  LIPASE, BLOOD   POCT I-STAT TROPONIN I  POCT PREGNANCY, URINE   Imaging Review Dg Chest 2 View  12/22/2012   *RADIOLOGY REPORT*  Clinical Data: Chest pain, abdominal pain, history of asthma  CHEST - 2 VIEW  Comparison: 11/09/2012; 11/03/2012; 10/21/2012  Findings: Grossly unchanged cardiac silhouette and mediastinal contours.  Overall improved aeration of the lungs.  No focal airspace opacities.  No pleural effusion or pneumothorax.  No definite evidence of edema.  Grossly unchanged bones.  IMPRESSION: No acute cardiopulmonary disease.  Specifically, no evidence of pneumonia.   Original Report Authenticated By: Tacey Ruiz, MD   Ct Abdomen Pelvis W Contrast  12/22/2012   *RADIOLOGY REPORT*  Clinical Data: Diffuse abdominal pain, nausea and vomiting  CT ABDOMEN AND PELVIS WITH CONTRAST  Technique:  Multidetector CT imaging of the abdomen and pelvis was performed following the standard protocol during bolus administration of intravenous contrast.  Contrast: OMNIPAQUE IOHEXOL 300 MG/ML  SOLN  Comparison: CT abdomen pelvis - 11/09/2007  Findings:  The patient is degraded secondary to patient body habitus.  Scattered colonic diverticulosis.  There is stranding about a short segment of sigmoid colon within the left lower abdomen/upper pelvis (images 63 - 70) findings compatible with acute diverticulitis. There is no pneumoperitoneum or evidence of perforation.  No discrete/definable/drainable fluid collection.  The bowel is otherwise normal in course and caliber without wall thickening or evidence of obstruction.  Normal appearance of the appendix.  No pneumatosis or portal venous gas.  Normal caliber of the abdominal aorta.  The major branch vessels of the abdominal aorta are patent on this non CTA examination. Scattered shoddy retroperitoneal lymph nodes are individually not enlarged by size criteria and presumably reactive.  No definite retroperitoneal, mesenteric, pelvic or inguinal lymphadenopathy.  Normal hepatic  contour.  No discrete hepatic lesions.  Normal appearance of the gallbladder.  No intra or extrahepatic biliary ductal dilatation.  No ascites.  There is symmetric enhancement and excretion of the bilateral kidneys.  No definite renal stones on the post contrast examination.  No discrete renal lesions.  No urinary obstruction or perinephric stranding.  Normal appearance of  the bilateral adrenal glands, pancreas and spleen.  Normal appearance of the anteverted uterus.  No discrete adnexal lesion.  No free fluid in the pelvis.  Limited visualization of the lower thorax is negative for focal airspace opacity or pleural effusion.  Normal heart size.  No pericardial effusion.  No acute or aggressive osseous abnormalities.  Mild diffuse increase sclerosis of the osseous structures is favored to be secondary to patient body habitus.  IMPRESSION: Findings compatible with acute uncomplicated diverticulitis involving a short segment of the sigmoid colon within the left lower abdomen/upper pelvis.  No evidence of perforation or drainable fluid collection.  If not recently performed, evaluation with colonoscopy should be performed after the resolution of acute symptoms.   Original Report Authenticated By: Tacey Ruiz, MD   ECG:  Date: 12/22/2012  Rate: 99  Rhythm: normal sinus rhythm  QRS Axis: normal  Intervals: normal  ST/T Wave abnormalities: nonspecific T wave changes  Conduction Disutrbances:none  Narrative Interpretation: inverted T waves III, aVF when compared to 11/03/2012 rate the patient only had T wave flattening in these leads  Old EKG Reviewed: changes noted     MDM   1. Diverticulitis large intestine   2. Abdominal pain      April [name] presents with lower abdominal pain.  Patient adamantly refusing pelvic exam and denying vaginal discharge or bleeding.  Patient reports that her lower abdominal pain radiates into the upper abdomen and into her chest; she denies chest pain absent from her  abdominal pain.  Her negative ECG nonischemic. CBC with acid is 15.4. We'll CT  11:00 PM CT scan with uncomplicated diverticulitis.  No evidence of perforation or fluid collection.  I personally reviewed the imaging tests through PACS system.  I reviewed available ER/hospitalization records through the EMR.  Will discharge home with Cipro, Flagyl, anti-emetics and pain control. Patient alert, nontoxic, nonseptic appearing. She is afebrile and not tachycardic at this time. She has tolerated by mouth without difficulty and her pain has been managed.  I have also discussed reasons to return immediately to the ER.  Patient expresses understanding and agrees with plan.         Dahlia Client Shilynn Hoch, PA-C 12/22/12 2313

## 2012-12-22 NOTE — ED Provider Notes (Signed)
Medical screening examination/treatment/procedure(s) were performed by non-physician practitioner and as supervising physician I was immediately available for consultation/collaboration.   Glynn Octave, MD 12/22/12 [phone]

## 2012-12-24 ENCOUNTER — Ambulatory Visit: Payer: Commercial Managed Care - PPO | Admitting: Internal Medicine

## 2012-12-24 ENCOUNTER — Encounter: Payer: Self-pay | Admitting: Internal Medicine

## 2012-12-29 ENCOUNTER — Ambulatory Visit (INDEPENDENT_AMBULATORY_CARE_PROVIDER_SITE_OTHER): Payer: Commercial Managed Care - PPO

## 2012-12-29 ENCOUNTER — Encounter: Payer: Self-pay | Admitting: Internal Medicine

## 2012-12-29 DIAGNOSIS — Z23 Encounter for immunization: Secondary | ICD-10-CM

## 2012-12-29 DIAGNOSIS — J45909 Unspecified asthma, uncomplicated: Secondary | ICD-10-CM

## 2012-12-30 ENCOUNTER — Encounter (HOSPITAL_COMMUNITY): Payer: Self-pay | Admitting: Emergency Medicine

## 2012-12-30 ENCOUNTER — Emergency Department (INDEPENDENT_AMBULATORY_CARE_PROVIDER_SITE_OTHER): Payer: Commercial Managed Care - PPO

## 2012-12-30 ENCOUNTER — Emergency Department (INDEPENDENT_AMBULATORY_CARE_PROVIDER_SITE_OTHER)
Admission: EM | Admit: 2012-12-30 | Discharge: 2012-12-30 | Disposition: A | Discharge status: Home / Self Care | Payer: Commercial Managed Care - PPO | Source: Home / Self Care | Attending: Emergency Medicine | Admitting: Emergency Medicine

## 2012-12-30 DIAGNOSIS — S60222A Contusion of left hand, initial encounter: Secondary | ICD-10-CM

## 2012-12-30 DIAGNOSIS — S60229A Contusion of unspecified hand, initial encounter: Secondary | ICD-10-CM

## 2012-12-30 MED ORDER — MELOXICAM 15 MG PO TABS: 15.0000 mg | ORAL_TABLET | Freq: Every day | ORAL | Status: DC

## 2012-12-30 MED ORDER — OXYCODONE-ACETAMINOPHEN 5-325 MG PO TABS: ORAL_TABLET | ORAL | Status: DC

## 2012-12-30 NOTE — ED Provider Notes (Signed)
Chief Complaint:   Chief Complaint  Patient presents with  . Wrist Injury    History of Present Illness:   April [name] is a 42 year old female who fell 2 days ago, striking her left hand against a wall. There is pain to palpation over the first metacarpal. She has pain with movement of her thumb but has a full range of motion. There is no numbness or tingling. She denies any pain in the wrist or the anatomical snuff box area.  Review of Systems:  Other than noted above, the patient denies any of the following symptoms: Systemic:  No fevers, chills, or sweats.  No fatigue or tiredness. Musculoskeletal:  No joint pain, arthritis, bursitis, swelling, back pain, or neck pain.  Neurological:  No muscular weakness, paresthesias.  PMFSH:  Past medical history, family history, social history, meds, and allergies were reviewed.  She has no allergies. She takes hydrochlorothiazide, albuterol, Dulera, and omeprazole. She has hypertension, asthma, COPD, and gastroesophageal reflux.  Physical Exam:   Vital signs:  BP 134/70  Pulse 90  Temp(Src) 99 F (37.2 C) (Oral)  Resp 22  SpO2 100%  LMP 12/08/2012 Gen:  Alert and oriented times 3.  In no distress. Musculoskeletal:  Exam of the hand reveals no swelling, bruising, or deformity. There is pain to palpation over the first metacarpal, no pain to palpation over the anatomical snuff box area or the wrist. The thumb has a full range of extension and flexion was normal tendon function.  Otherwise, all joints had a full a ROM with no swelling, bruising or deformity.  No edema, pulses full. Extremities were warm and pink.  Capillary refill was brisk.  Skin:  Clear, warm and dry.  No rash. Neuro:  Alert and oriented times 3.  Muscle strength was normal.  Sensation was intact to light touch.   Radiology:  Dg Hand Complete Left  12/30/2012   *RADIOLOGY REPORT*  Clinical Data: Left hand pain following injury  LEFT HAND - COMPLETE 3+ VIEW  Comparison: None.   Findings: No acute fracture or dislocation is noted.  No gross soft tissue abnormality is seen.  IMPRESSION: No acute abnormality noted.   Original Report Authenticated By: Alcide Clever, M.D.   I reviewed the images independently and personally and concur with the radiologist's findings.  Course in Urgent Care Center:   Placed in a thumb spica.  Assessment:  The encounter diagnosis was Hand contusion, left, initial encounter.  No evidence of fracture. Suggested she wear the thumb spica for 2 weeks. If still having pain at the end of that time, followup with orthopedics.  Plan:   1.  The following meds were prescribed:   Discharge Medication List as of 12/30/2012  5:55 PM    START taking these medications   Details  meloxicam (MOBIC) 15 MG tablet Take 1 tablet (15 mg total) by mouth daily., Starting 12/30/2012, Until Discontinued, Normal    oxyCODONE-acetaminophen (PERCOCET) 5-325 MG per tablet 1 to 2 tablets every 6 hours as needed for pain., Print       2.  The patient was instructed in symptomatic care, including rest and activity, elevation, application of ice and compression.  Appropriate handouts were given. 3.  The patient was told to return if becoming worse in any way, if no better in 3 or 4 days, and given some red flag symptoms such as worsening pain that would indicate earlier return.   4.  The patient was told to follow up Dr.  Marjean Donna in 2 weeks if no better.     Reuben Likes, MD 12/30/12 [phone]

## 2012-12-30 NOTE — ED Notes (Signed)
Injury to left wrist 2 days ago. Pt states that she was trying to break her fall and is not sure which way her hand was bent when she landed on it.  Pt is c/o pain in palm of hand and left wrist. Pt has tried otc meds with no relief.

## 2013-01-01 MED ORDER — OMALIZUMAB 150 MG ~~LOC~~ SOLR
225.0000 mg | Freq: Once | SUBCUTANEOUS | Status: AC
  Administered 2013-01-01: 225 mg via SUBCUTANEOUS

## 2013-01-12 ENCOUNTER — Ambulatory Visit: Payer: Commercial Managed Care - PPO

## 2013-01-15 ENCOUNTER — Encounter: Payer: Self-pay | Admitting: Internal Medicine

## 2013-01-15 ENCOUNTER — Ambulatory Visit (INDEPENDENT_AMBULATORY_CARE_PROVIDER_SITE_OTHER): Payer: Commercial Managed Care - PPO | Admitting: Internal Medicine

## 2013-01-15 ENCOUNTER — Ambulatory Visit: Payer: Commercial Managed Care - PPO

## 2013-01-15 VITALS — BP 124/76 | HR 78 | Ht 66.0 in | Wt 362.8 lb

## 2013-01-15 DIAGNOSIS — J45909 Unspecified asthma, uncomplicated: Secondary | ICD-10-CM

## 2013-01-15 DIAGNOSIS — G4700 Insomnia, unspecified: Secondary | ICD-10-CM

## 2013-01-15 DIAGNOSIS — Z6841 Body Mass Index (BMI) 40.0 and over, adult: Secondary | ICD-10-CM

## 2013-01-15 IMAGING — CR DG CHEST 2V
2 series · 2 of 2 positions shown · non-contrast
Comparison: 08/08/2010; 08/06/2010; 03/04/2010

CLINICAL DATA: Shortness of breath, asthma

CHEST - 2 VIEW

[w chest pa]
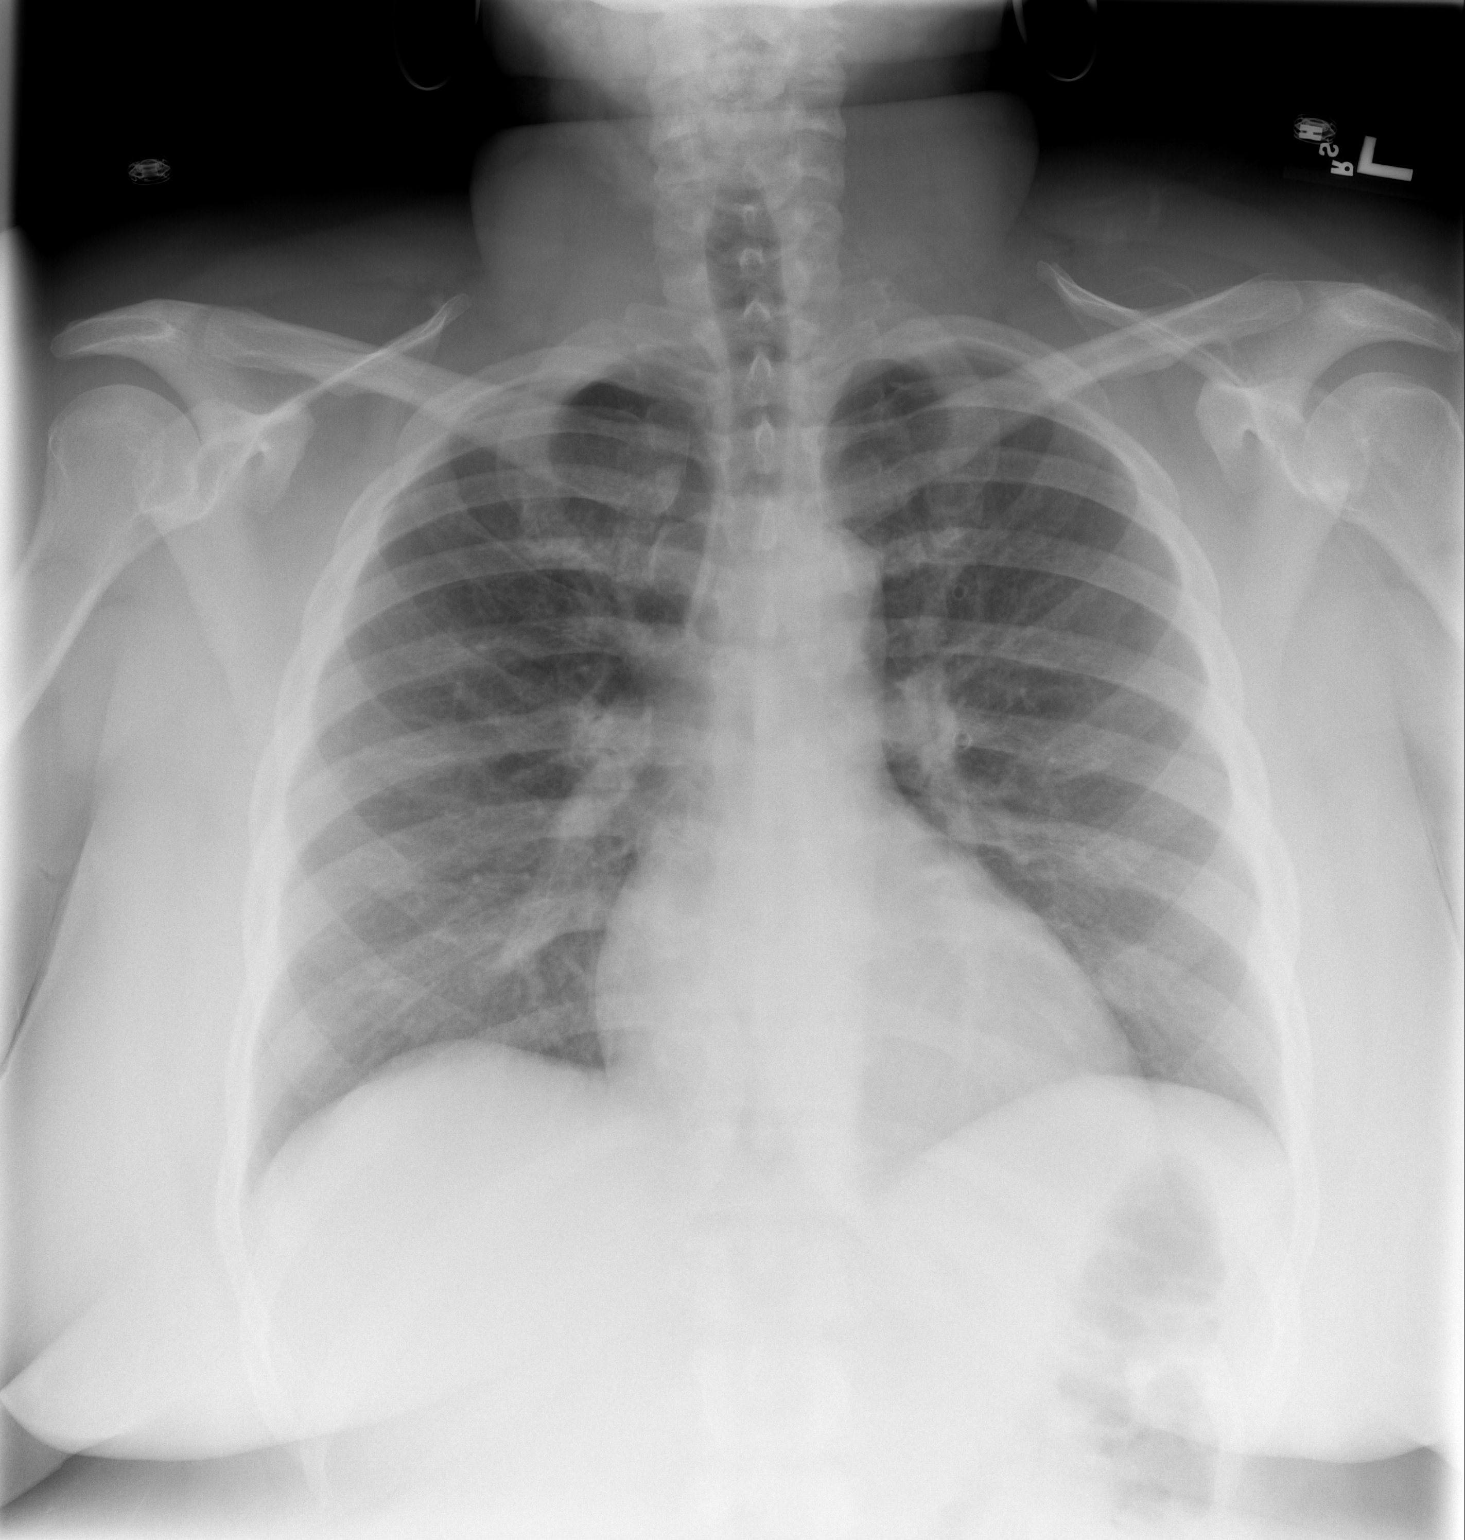

[w chest lat]
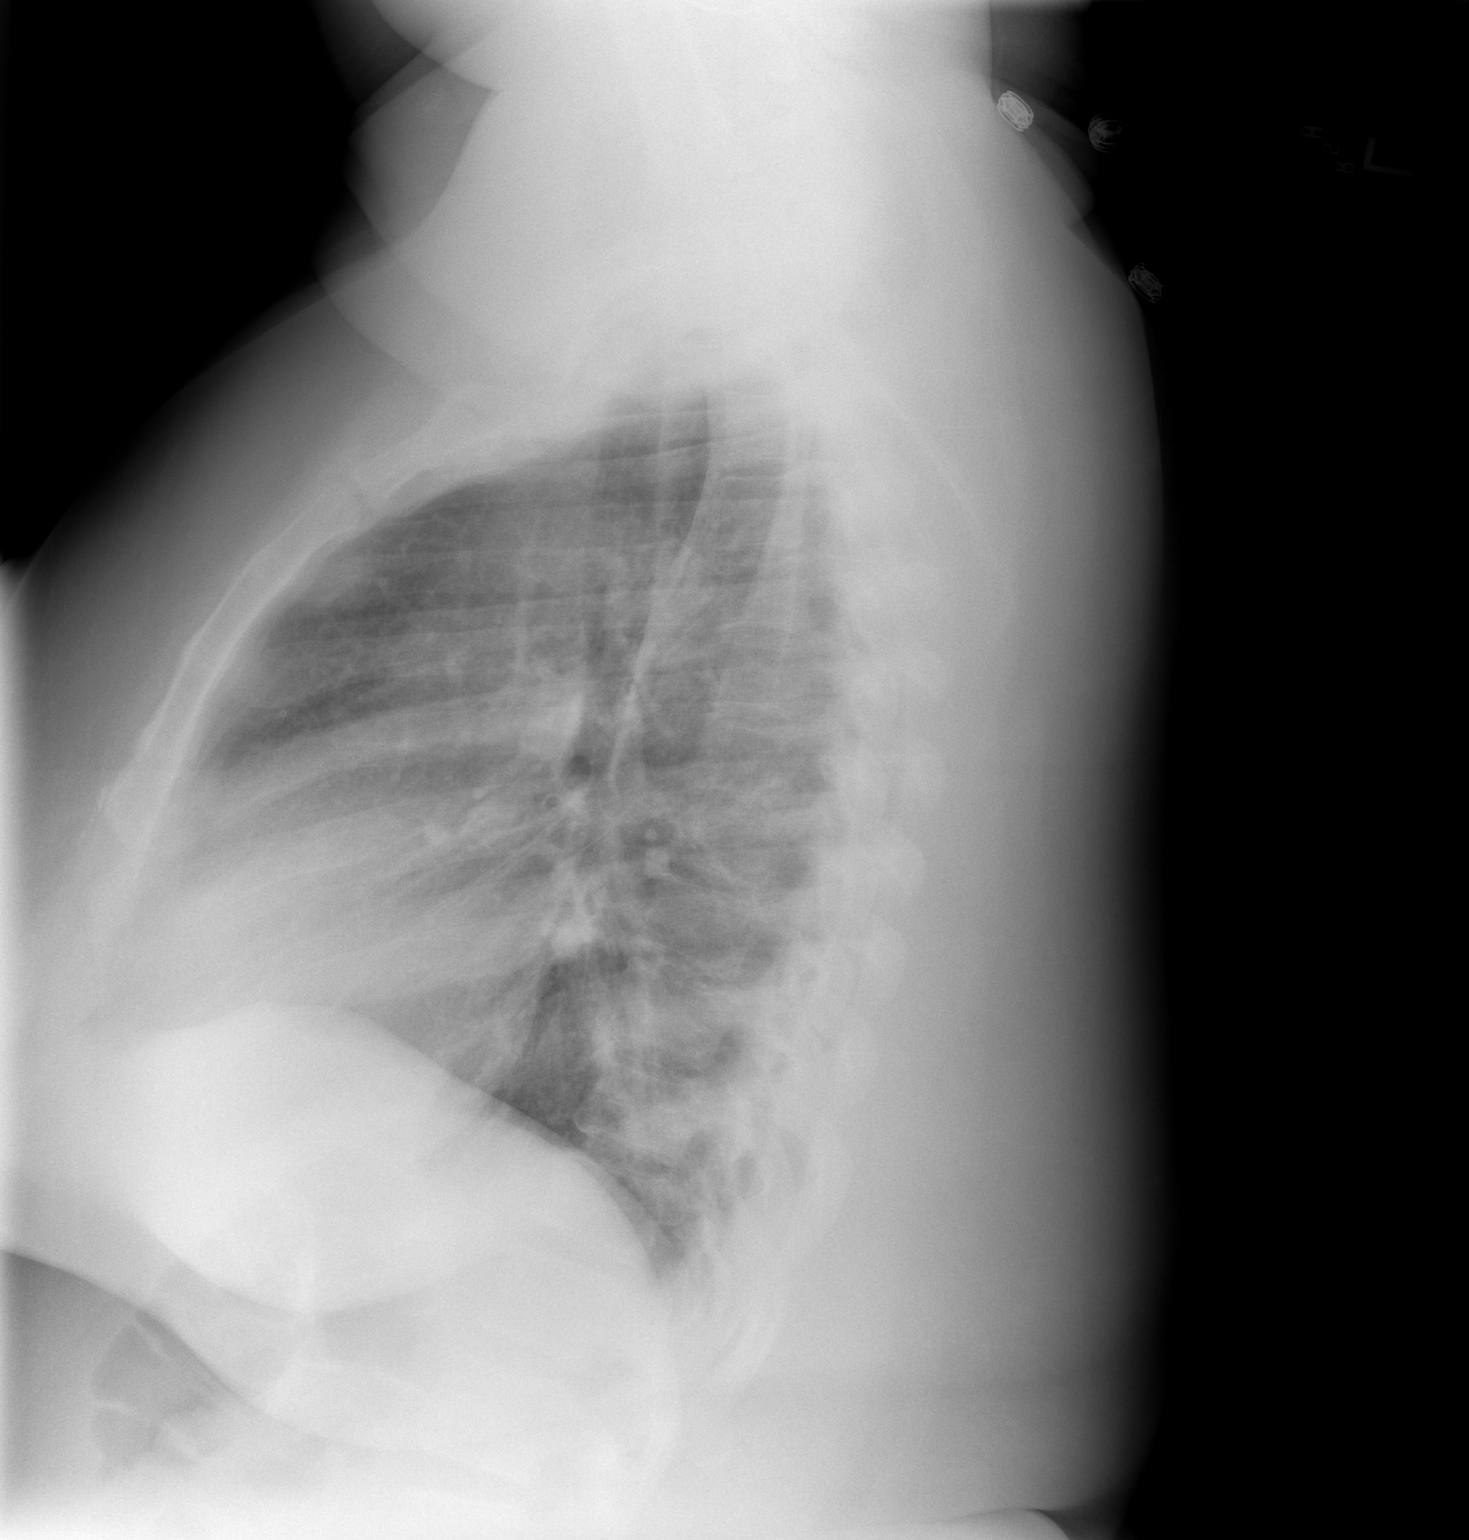

[2 of 2 positions shown; findings below may reference images not displayed]

FINDINGS: Grossly unchanged cardiac silhouette and mediastinal
contours.  No focal airspace opacities.  No pleural effusion or
pneumothorax.  No acute osseous abnormalities.
IMPRESSION: No acute cardiopulmonary disease.

## 2013-01-15 MED ORDER — TEMAZEPAM 30 MG PO CAPS: 30.0000 mg | ORAL_CAPSULE | Freq: Every evening | ORAL | Status: DC | PRN

## 2013-01-15 NOTE — Patient Instructions (Addendum)
Script temazepam for sleep   1 at bedtime if needed          You can also take otc melatonin with this  Avoid all  Caffeine after lunch- sodas, coffee  Order- refer to [facility] Bariatric program for nutrition/ weight loss   Dx morbid obesity

## 2013-01-15 NOTE — Progress Notes (Signed)
Patient ID: April [name], female    DOB: [birth year], 42 y.o.   MRN: [id number]  HPI 66 yoF former smoker followed for chronic severe asthma. Hospitalized 4/11-12/12 for another exacerbation she says was triggered by catching a cold. Common triggers include colds and any smoke exposure. Back now to baseline. She has continued Xolair injection here every 2 weeks and does feel it has reduced frequency of flare-ups. Usual weight range 320-340 and she asks why weight is up to 360. Discussed steroids, including IV therapy at hospital but also ?role of Dulera 200-5. She is now being seen [facility] anticipating breast reduction surgery and we talked about bariatric surgery. Takes omeprazole twice daily before meals, but has frequent heart burn.   04/12/11- 47 yoF former smoker followed for chronic severe asthma. Has had flu vaccine and pertussis vaccine. Says her breathing is okay. We had sent antibiotic for a cold with bronchitis around Thanksgiving. She denies any smoking at all now. She has avoided hospital partly because she has been better and partly because she is unwilling to go. Xolair has made a real improvement. She had [facility] reduction [facility] without problems [name] and has considered bariatric surgery but can't afford it. Uses her nebulizer machine but not every day. Does continue Dulera and omeprazole. She is taking Ambien but still sleeps poorly at night. She no longer misses much work but that means she can't nap in the daytime. Complains of frequently waking at night but does not know if she snores.  04/25/11-  67 yoF former smoker followed for chronic severe asthma complicated by morbid obesity, HBP, depression. Her antidepressant was changed to Wellbutrin on December 18. Since then she can't sleep, using 3 Ambien per night. Increased asthma with wheezing. She increased to [facility] to 6 puffs daily. Had a cold at Thanksgiving but not since. She continues Xolair.  08/06/11- 24 yoF former  smoker followed for chronic severe asthma complicated by morbid obesity, HBP, depression, GERD. Change jobs and insurance. Had a lapse of 2 weeks in her Xolair therapy but ready to restart. Work environment is clear and cool, indoors. Watery rhinorrhea not helped by 3 intermittent use of Flonase. No acute asthma attack but uses her Dulera and rescue inhaler every night at bedtime. Ambien helps sleep.  10/02/12- 26 yoF former smoker followed for chronic severe asthma complicated by morbid obesity, HBP, depression, GERD. FOLLOWS FOR: had a few hosptial visits recently due to insurance and PA for Rosana Berger to have medication on Tuesday 10-06-12 and can resume Xolair then. Continues to have trouble sleeping (falling and staying) and would like to go back on Ambien if possible. Throat itch x several days, dry cough. ER again 2 weeks ago. Off Xolair x 5 weeks but has insurance ok now to restart. She started some left over prednisone. Aware occasional reflux- discussed precautions. CXR 05/22/12 IMPRESSION:  No acute infiltrate or pulmonary edema. Mild perihilar  increased bronchial markings without focal consolidation.  Original Report Authenticated By: Lahoma Crocker, M.D.   11/09/2012 [facility] follow up  Recent admitted for Acute asthma flare. Tx w/ abx , steroids and nebs. Reports doing well since discharge.  finished prednisone and abx from hosp but currently on Cipro for UTI.  no new complaints; reports breathing is back to baseline.   Had restarted smoking but has quit . We discussed med compliance. No fever, chest pain, orthopnea, hemoptysis.  No edema.  11/09/2012 CXR Stable, with suspected artifactual density in the medial  right  upper lung related to summation. No acute cardiopulmonary  Abnormality.  01/15/13- 42 yoF former smoker followed for chronic severe asthma complicated by morbid obesity, HBP, depression, GERD. FOLLOWS FOR: patient is not working but has insurance-medications costly and  unable to afford at times. Had been getting Xolair shots regularly, but has had to skip while insurance is worked out. Has had flu vaccine. New problem-insomnia. Difficulty initiating and maintaining sleep not adequately helped by clonazepam. Irregular bedtime. Gets up between 4 and 5 AM. Previously used Ambien but blames it for hallucination and was using too much. Sleep study ordered 2012 but she cannot get it done CXR 12/22/12 IMPRESSION:  No acute cardiopulmonary disease. Specifically, no evidence of  pneumonia.  Original Report Authenticated By: Tacey Ruiz, MD   Review of Systems-See HPI Constitutional:   No-   weight loss, night sweats, fevers, chills, +fatigue, lassitude. HEENT:   No-  headaches, difficulty swallowing, tooth/dental problems, sore throat,       + sneezing, itching, ear ache, nasal congestion, post nasal drip,  CV:  No-   chest pain, orthopnea, PND, swelling in lower extremities, anasarca, dizziness, palpitations Resp: No-acute shortness of breath with exertion or at rest.              No-   productive cough,  N+ non-productive cough,  No- coughing up of blood.              No-   change in color of mucus. wheezing.   Skin: No-   rash or lesions. GI:  No-   heartburn, indigestion, abdominal pain, nausea, vomiting,  GU: MS:  No-   joint pain or swelling.   Neuro-     nothing unusual Psych:  No- change in mood or affect. No depression or anxiety.  No memory loss.    Objective:   Physical Exam General- Alert, Oriented, Affect-appropriate, Distress- none acute, + morbid obesity,  Skin- rash-none, lesions- none, excoriation- none Lymphadenopathy- none Head- atraumatic            Eyes- Gross vision intact, PERRLA, conjunctivae clear secretions            Ears- Hearing, canals-normal            Nose- Clear, no-Septal dev, mucus, polyps, erosion, perforation             Throat- Mallampati II , mucosa clear , drainage- none, tonsils- atrophic Neck- flexible , trachea  midline, no stridor , thyroid nl, carotid no bruit Chest - symmetrical excursion , unlabored           Heart/CV- RRR , no murmur , no gallop  , no rub, nl s1 s2                           - JVD- none , edema- none, stasis changes- none, varices- none           Lung- Diminished, unlabored. No wheeze,no cough, dullness-none, rub- none           Chest wall-  Abd-  Br/ Gen/ Rectal- Not done, not indicated Extrem- cyanosis- none, clubbing, none, atrophy- none, strength- nl Neuro- grossly intact to observation

## 2013-01-20 ENCOUNTER — Telehealth: Payer: Self-pay | Admitting: Internal Medicine

## 2013-01-20 NOTE — Telephone Encounter (Signed)
No further action required

## 2013-01-24 ENCOUNTER — Encounter (HOSPITAL_COMMUNITY): Payer: Self-pay | Admitting: Emergency Medicine

## 2013-01-24 ENCOUNTER — Emergency Department (INDEPENDENT_AMBULATORY_CARE_PROVIDER_SITE_OTHER)
Admission: EM | Admit: 2013-01-24 | Discharge: 2013-01-24 | Disposition: A | Discharge status: Home / Self Care | Payer: Commercial Managed Care - PPO | Source: Home / Self Care

## 2013-01-24 DIAGNOSIS — IMO0002 Reserved for concepts with insufficient information to code with codable children: Secondary | ICD-10-CM

## 2013-01-24 DIAGNOSIS — L03011 Cellulitis of right finger: Secondary | ICD-10-CM

## 2013-01-24 MED ORDER — DOXYCYCLINE HYCLATE 100 MG PO CAPS: 100.0000 mg | ORAL_CAPSULE | Freq: Two times a day (BID) | ORAL | Status: DC

## 2013-01-24 NOTE — ED Notes (Signed)
C/o right middle finger swelling for two weeks.  Patient states there was no injury to finger.  Did ice finger.

## 2013-01-24 NOTE — ED Provider Notes (Signed)
April [name] is a 42 y.o. female who presents to Urgent Care today for right third digit swelling and pain worsening over the past 2 weeks. No injury. Pain and swelling in his distal. No medications tried no fevers or chills. No nausea vomiting or diarrhea.    Past Medical History  Diagnosis Date  . Acanthosis nigricans   . Menorrhagia   . Hypertension, essential   . Insomnia   . Morbid obesity   . Helicobacter pylori (H. pylori) infection   . Sleep apnea     Sleep study 2008 - mild OSA, not enough events to titrate CPAP  . COPD (chronic obstructive pulmonary disease)     PFTs in 2002, FEV1/FVC 65, no post bronchodilater test done  . Asthma     Followed by Dr. Maple Hudson (pulmonology); receives every other week omalizumab injections; has frequent exacerbations  . Depression   . GERD (gastroesophageal reflux disease)   . Tobacco user   . Obesity    History  Substance Use Topics  . Smoking status: Former Smoker -- 0.25 packs/day for 9 years    Types: Cigarettes    Quit date: 10/04/2012  . Smokeless tobacco: Never Used  . Alcohol Use: No   ROS as above Medications reviewed. No current facility-administered medications for this encounter.   Current Outpatient Prescriptions  Medication Sig Dispense Refill  . albuterol (PROVENTIL HFA;VENTOLIN HFA) 108 (90 BASE) MCG/ACT inhaler Inhale 2 puffs into the lungs every 4 (four) hours as needed for wheezing or shortness of breath.  6.7 g  5  . doxycycline (VIBRAMYCIN) 100 MG capsule Take 1 capsule (100 mg total) by mouth 2 (two) times daily.  20 capsule  0  . EPINEPHrine (EPIPEN) 0.3 mg/0.3 mL SOAJ Inject 0.3 mLs (0.3 mg total) into the skin once as needed.  1 Device  6  . hydrochlorothiazide (HYDRODIURIL) 25 MG tablet Take 1 tablet (25 mg total) by mouth daily.  30 tablet  11  . meloxicam (MOBIC) 15 MG tablet Take 1 tablet (15 mg total) by mouth daily.  15 tablet  0  . mometasone-formoterol (DULERA) 100-5 MCG/ACT AERO Inhale 2 puffs into the  lungs 2 (two) times daily.  13 g  5  . omalizumab (XOLAIR) 150 MG injection Inject 225 mg into the skin every 14 (fourteen) days.      [name] omeprazole (PRILOSEC) 20 MG capsule Take 1 capsule (20 mg total) by mouth 2 (two) times daily before a meal.  30 capsule  12  . temazepam (RESTORIL) 30 MG capsule Take 1 capsule (30 mg total) by mouth at bedtime as needed for sleep.  30 capsule  2  . temazepam (RESTORIL) 7.5 MG capsule Take 1 capsule (7.5 mg total) by mouth at bedtime as needed for sleep.  30 capsule  0  . [DISCONTINUED] albuterol (PROVENTIL HFA;VENTOLIN HFA) 108 (90 BASE) MCG/ACT inhaler Inhale 1-2 puffs into the lungs every 6 (six) hours as needed for wheezing.  1 Inhaler  0    Exam:  BP 174/85  Pulse 97  Temp(Src) 98.8 F (37.1 C) (Oral)  Resp 18  SpO2 100%  LMP 12/08/2012 Gen: Well NAD RIGHT HAND SWOLLEN FLUCTUANCE AND TENDER DISTAL THIRD DIGIT. Sensation and capillary refill are intact distally.   Procedure note:  Consent obtained and timeout performed. Base of the third digit interdigital space cleaned with alcohol cold spray applied and a total of 4 mL of lidocaine were used to achieve anesthesia of the digit.  The skin  overlying the area of maximal fluctuance at the distal digit cleaned with alcohol cold spray applied and a small 2 mm incision is an 11 blade scalpel was made. Pus was expressed and cultured A dressing was applied. Patient tolerated the procedure well.  Assessment and Plan: 42 y.o. female with paronychia.  Culture pending Plan to use empiric doxycycline Followup as needed Discussed warning signs or symptoms. Please see discharge instructions. Patient expresses understanding.      Rodolph Bong, MD 01/24/13 [phone]

## 2013-01-26 NOTE — Assessment & Plan Note (Signed)
Without acute exacerbation.

## 2013-01-26 NOTE — Assessment & Plan Note (Signed)
I see no sign of any effort to lose weight. We talked again about bariatric surgery.

## 2013-01-26 NOTE — Assessment & Plan Note (Signed)
With discussion of sleep hygienePlan-temazepam for trial

## 2013-01-27 ENCOUNTER — Telehealth: Payer: Self-pay | Admitting: Internal Medicine

## 2013-01-27 ENCOUNTER — Ambulatory Visit (INDEPENDENT_AMBULATORY_CARE_PROVIDER_SITE_OTHER): Payer: Commercial Managed Care - PPO

## 2013-01-27 DIAGNOSIS — J45909 Unspecified asthma, uncomplicated: Secondary | ICD-10-CM

## 2013-01-27 LAB — CULTURE, ROUTINE-ABSCESS: Culture: NO GROWTH

## 2013-01-27 NOTE — Telephone Encounter (Signed)
Pt stated everything was taken care of, nothing further needed. Carron Curie, CMA

## 2013-01-29 ENCOUNTER — Ambulatory Visit: Payer: Commercial Managed Care - PPO

## 2013-02-03 MED ORDER — OMALIZUMAB 150 MG ~~LOC~~ SOLR
225.0000 mg | Freq: Once | SUBCUTANEOUS | Status: AC
  Administered 2013-02-03: 225 mg via SUBCUTANEOUS

## 2013-02-10 ENCOUNTER — Ambulatory Visit (INDEPENDENT_AMBULATORY_CARE_PROVIDER_SITE_OTHER): Payer: Commercial Managed Care - PPO

## 2013-02-10 ENCOUNTER — Telehealth: Payer: Self-pay | Admitting: Internal Medicine

## 2013-02-10 DIAGNOSIS — J45909 Unspecified asthma, uncomplicated: Secondary | ICD-10-CM

## 2013-02-10 MED ORDER — OMALIZUMAB 150 MG ~~LOC~~ SOLR
225.0000 mg | Freq: Once | SUBCUTANEOUS | Status: AC
  Administered 2013-02-10: 225 mg via SUBCUTANEOUS

## 2013-02-10 NOTE — Telephone Encounter (Signed)
Pt informed that we do not have sample of Ventolin to give her.

## 2013-02-15 ENCOUNTER — Emergency Department (HOSPITAL_COMMUNITY): Payer: Commercial Managed Care - PPO

## 2013-02-15 ENCOUNTER — Encounter (HOSPITAL_COMMUNITY): Payer: Self-pay | Admitting: Emergency Medicine

## 2013-02-15 ENCOUNTER — Emergency Department (HOSPITAL_COMMUNITY)
Admission: EM | Admit: 2013-02-15 | Discharge: 2013-02-15 | Disposition: A | Discharge status: Home / Self Care | Payer: Commercial Managed Care - PPO | Attending: Emergency Medicine | Admitting: Emergency Medicine

## 2013-02-15 DIAGNOSIS — Z8619 Personal history of other infectious and parasitic diseases: Secondary | ICD-10-CM | POA: Insufficient documentation

## 2013-02-15 DIAGNOSIS — Z79899 Other long term (current) drug therapy: Secondary | ICD-10-CM | POA: Insufficient documentation

## 2013-02-15 DIAGNOSIS — F3289 Other specified depressive episodes: Secondary | ICD-10-CM | POA: Insufficient documentation

## 2013-02-15 DIAGNOSIS — K219 Gastro-esophageal reflux disease without esophagitis: Secondary | ICD-10-CM | POA: Insufficient documentation

## 2013-02-15 DIAGNOSIS — Z872 Personal history of diseases of the skin and subcutaneous tissue: Secondary | ICD-10-CM | POA: Insufficient documentation

## 2013-02-15 DIAGNOSIS — Z87891 Personal history of nicotine dependence: Secondary | ICD-10-CM | POA: Insufficient documentation

## 2013-02-15 DIAGNOSIS — J40 Bronchitis, not specified as acute or chronic: Secondary | ICD-10-CM

## 2013-02-15 DIAGNOSIS — Z792 Long term (current) use of antibiotics: Secondary | ICD-10-CM | POA: Insufficient documentation

## 2013-02-15 DIAGNOSIS — Z8742 Personal history of other diseases of the female genital tract: Secondary | ICD-10-CM | POA: Insufficient documentation

## 2013-02-15 DIAGNOSIS — F329 Major depressive disorder, single episode, unspecified: Secondary | ICD-10-CM | POA: Insufficient documentation

## 2013-02-15 DIAGNOSIS — J441 Chronic obstructive pulmonary disease with (acute) exacerbation: Secondary | ICD-10-CM | POA: Insufficient documentation

## 2013-02-15 DIAGNOSIS — I10 Essential (primary) hypertension: Secondary | ICD-10-CM | POA: Insufficient documentation

## 2013-02-15 DIAGNOSIS — IMO0002 Reserved for concepts with insufficient information to code with codable children: Secondary | ICD-10-CM | POA: Insufficient documentation

## 2013-02-15 LAB — POCT I-STAT, CHEM 8
BUN: 7 mg/dL (ref 6–23)
Calcium, Ion: 1.18 mmol/L (ref 1.12–1.23)
Chloride: 102 mEq/L (ref 96–112)
Creatinine, Ser: 1 mg/dL (ref 0.50–1.10)
HCT: 39 % (ref 36.0–46.0)
Potassium: 3.7 mEq/L (ref 3.5–5.1)

## 2013-02-15 LAB — CBC WITH DIFFERENTIAL/PLATELET
Basophils Absolute: 0 10*3/uL (ref 0.0–0.1)
Basophils Relative: 0 % (ref 0–1)
Eosinophils Relative: 3 % (ref 0–5)
Lymphocytes Relative: 23 % (ref 12–46)
Lymphs Abs: 2.5 10*3/uL (ref 0.7–4.0)
MCH: 27.7 pg (ref 26.0–34.0)
MCV: 84.4 fL (ref 78.0–100.0)
Neutro Abs: 7.1 10*3/uL (ref 1.7–7.7)
Neutrophils Relative %: 65 % (ref 43–77)
Platelets: 360 10*3/uL (ref 150–400)
RBC: 4.37 MIL/uL (ref 3.87–5.11)
RDW: 15.4 % (ref 11.5–15.5)
WBC: 10.9 10*3/uL — ABNORMAL HIGH (ref 4.0–10.5)

## 2013-02-15 MED ORDER — IPRATROPIUM BROMIDE 0.02 % IN SOLN
0.5000 mg | Freq: Once | RESPIRATORY_TRACT | Status: AC
  Administered 2013-02-15: 0.5 mg via RESPIRATORY_TRACT
  Filled 2013-02-15: qty 2.5

## 2013-02-15 MED ORDER — ALBUTEROL SULFATE HFA 108 (90 BASE) MCG/ACT IN AERS
2.0000 | INHALATION_SPRAY | Freq: Once | RESPIRATORY_TRACT | Status: AC
  Administered 2013-02-15: 2 via RESPIRATORY_TRACT
  Filled 2013-02-15: qty 6.7

## 2013-02-15 MED ORDER — ALBUTEROL SULFATE (5 MG/ML) 0.5% IN NEBU
2.5000 mg | INHALATION_SOLUTION | Freq: Once | RESPIRATORY_TRACT | Status: AC
  Administered 2013-02-15: 2.5 mg via RESPIRATORY_TRACT
  Filled 2013-02-15: qty 0.5

## 2013-02-15 MED ORDER — METHYLPREDNISOLONE SODIUM SUCC 125 MG IJ SOLR
125.0000 mg | Freq: Once | INTRAMUSCULAR | Status: AC
  Administered 2013-02-15: 125 mg via INTRAVENOUS
  Filled 2013-02-15: qty 2

## 2013-02-15 MED ORDER — AZITHROMYCIN 250 MG PO TABS: 250.0000 mg | ORAL_TABLET | Freq: Every day | ORAL | Status: DC

## 2013-02-15 MED ORDER — PREDNISONE 10 MG PO TABS: 20.0000 mg | ORAL_TABLET | Freq: Two times a day (BID) | ORAL | Status: DC

## 2013-02-15 NOTE — ED Notes (Signed)
Pt has hx of asthma, states she started having trouble breathing about 0300 this morning and it woke her up. Has been coughing up yellow mucus since last night as well. Has audible wheezes heard. States she has tried her nebulizer and inhalers but they are not working. 100 % on RA at this time.

## 2013-02-15 NOTE — ED Notes (Signed)
Kelly, NP at the bedside.  

## 2013-02-15 NOTE — ED Notes (Signed)
Xray personnel has taken patient to xray

## 2013-02-15 NOTE — ED Provider Notes (Signed)
Medical screening examination/treatment/procedure(s) were performed by non-physician practitioner and as supervising physician I was immediately available for consultation/collaboration.  Flint Melter, MD 02/15/13 [phone]

## 2013-02-15 NOTE — ED Provider Notes (Signed)
CSN: [id number]     Arrival date & time 02/15/13  0800 History   First MD Initiated Contact with Patient 02/15/13 [phone]     Chief Complaint  Patient presents with  . Asthma   (Consider location/radiation/quality/duration/timing/severity/associated sxs/prior Treatment) Patient is a 42 y.o. female presenting with cough. The history is provided by the patient. No language interpreter was used.  Cough Cough characteristics:  Productive Sputum characteristics:  Yellow Severity:  Moderate Duration:  2 days Timing:  Intermittent Smoker: no   Context: not sick contacts and not smoke exposure   Ineffective treatments:  Beta-agonist inhaler Associated symptoms: wheezing   Associated symptoms: no chest pain, no chills and no fever   Wheezing:    Severity:  Moderate   Onset quality:  Gradual   Duration:  6 hours   Timing:  Intermittent   Progression:  Waxing and waning Risk factors: no recent infection and no recent travel     Past Medical History  Diagnosis Date  . Acanthosis nigricans   . Menorrhagia   . Hypertension, essential   . Insomnia   . Morbid obesity   . Helicobacter pylori (H. pylori) infection   . Sleep apnea     Sleep study 2008 - mild OSA, not enough events to titrate CPAP  . COPD (chronic obstructive pulmonary disease)     PFTs in 2002, FEV1/FVC 65, no post bronchodilater test done  . Asthma     Followed by Dr. Maple Hudson (pulmonology); receives every other week omalizumab injections; has frequent exacerbations  . Depression   . GERD (gastroesophageal reflux disease)   . Tobacco user   . Obesity    Past Surgical History  Procedure Laterality Date  . Tubal ligation  1996    bilateral  . Breast reduction surgery  09/2011   Family History  Problem Relation Age of Onset  . Asthma Daughter   . Cancer Paternal Aunt   . Asthma Maternal Grandmother   . Hypertension Mother    History  Substance Use Topics  . Smoking status: Former Smoker -- 0.25 packs/day for 9  years    Types: Cigarettes    Quit date: 10/04/2012  . Smokeless tobacco: Never Used  . Alcohol Use: No   OB History   Grav Para Term Preterm Abortions TAB SAB Ect Mult Living                 Review of Systems  Constitutional: Negative for fever and chills.  Respiratory: Positive for cough and wheezing.   Cardiovascular: Negative for chest pain.  Genitourinary: Negative for dysuria.  Neurological: Negative for weakness.  All other systems reviewed and are negative.    Allergies  Ace inhibitors and Percocet  Home Medications   Current Outpatient Rx  Name  Route  Sig  Dispense  Refill  . albuterol (PROVENTIL HFA;VENTOLIN HFA) 108 (90 BASE) MCG/ACT inhaler   Inhalation   Inhale 2 puffs into the lungs every 4 (four) hours as needed for wheezing or shortness of breath.   6.7 g   5   . doxycycline (VIBRAMYCIN) 100 MG capsule   Oral   Take 1 capsule (100 mg total) by mouth 2 (two) times daily.   20 capsule   0   . EPINEPHrine (EPIPEN) 0.3 mg/0.3 mL SOAJ   Subcutaneous   Inject 0.3 mLs (0.3 mg total) into the skin once as needed.   1 Device   6   . hydrochlorothiazide (HYDRODIURIL) 25 MG tablet  Oral   Take 1 tablet (25 mg total) by mouth daily.   30 tablet   11   . meloxicam (MOBIC) 15 MG tablet   Oral   Take 1 tablet (15 mg total) by mouth daily.   15 tablet   0   . mometasone-formoterol (DULERA) 100-5 MCG/ACT AERO   Inhalation   Inhale 2 puffs into the lungs 2 (two) times daily.   13 g   5   . omalizumab (XOLAIR) 150 MG injection   Subcutaneous   Inject 225 mg into the skin every 14 (fourteen) days.         [name] omeprazole (PRILOSEC) 20 MG capsule   Oral   Take 1 capsule (20 mg total) by mouth 2 (two) times daily before a meal.   30 capsule   12   . temazepam (RESTORIL) 30 MG capsule   Oral   Take 1 capsule (30 mg total) by mouth at bedtime as needed for sleep.   30 capsule   2   . EXPIRED: temazepam (RESTORIL) 7.5 MG capsule   Oral   Take  1 capsule (7.5 mg total) by mouth at bedtime as needed for sleep.   30 capsule   0    BP 172/96  Pulse 81  Temp(Src) 98.6 F (37 C) (Oral)  Resp 22  Ht 5\' 6"  (1.676 m)  Wt 300 lb (136.079 kg)  BMI 48.44 kg/m2  SpO2 100%  LMP 02/08/2013 Physical Exam  Nursing note and vitals reviewed. Constitutional: She is oriented to person, place, and time. She appears well-developed and well-nourished. No distress.  HENT:  Head: Normocephalic and atraumatic.  Mouth/Throat: Uvula is midline and mucous membranes are normal. Posterior oropharyngeal erythema present.  Eyes: Pupils are equal, round, and reactive to light.  Neck: Normal range of motion. Neck supple. No JVD present. No thyromegaly present.  Cardiovascular: Normal rate, regular rhythm, normal heart sounds and intact distal pulses.   Pulmonary/Chest: Effort normal. No accessory muscle usage. No respiratory distress. She has wheezes in the right upper field, the right middle field and the left upper field.  Abdominal: Soft. Bowel sounds are normal. She exhibits no distension. There is no tenderness.  Musculoskeletal: Normal range of motion.  Lymphadenopathy:    She has no cervical adenopathy.  Neurological: She is alert and oriented to person, place, and time. No cranial nerve deficit.  Skin: Skin is warm and dry.  Psychiatric: She has a normal mood and affect. Her behavior is normal. Judgment and thought content normal.    ED Course  Procedures (including critical care time) Labs Review Labs Reviewed - No data to display Imaging Review No results found.  EKG Interpretation   None       MDM   1. Bronchitis    Bronchitis and wheezing, cleared by two neb treatments here in ER. Chest x-ray; Lungs clear. Sputum yellowish in color. No fever or chills. No chest pain or difficulty breathing. SPO2 100%, no tachypnea. Azithromycin and prednisone to go. Follow-up with [facility] where she is already established as a  pt.        Irish Elders, NP 02/15/13 1013

## 2013-02-20 ENCOUNTER — Encounter (HOSPITAL_COMMUNITY): Payer: Self-pay | Admitting: Emergency Medicine

## 2013-02-20 ENCOUNTER — Emergency Department (HOSPITAL_COMMUNITY): Payer: Commercial Managed Care - PPO

## 2013-02-20 ENCOUNTER — Emergency Department (HOSPITAL_COMMUNITY)
Admission: EM | Admit: 2013-02-20 | Discharge: 2013-02-20 | Disposition: A | Discharge status: Home / Self Care | Payer: Commercial Managed Care - PPO | Attending: Emergency Medicine | Admitting: Emergency Medicine

## 2013-02-20 DIAGNOSIS — Z8742 Personal history of other diseases of the female genital tract: Secondary | ICD-10-CM | POA: Insufficient documentation

## 2013-02-20 DIAGNOSIS — L83 Acanthosis nigricans: Secondary | ICD-10-CM | POA: Insufficient documentation

## 2013-02-20 DIAGNOSIS — J45909 Unspecified asthma, uncomplicated: Secondary | ICD-10-CM | POA: Insufficient documentation

## 2013-02-20 DIAGNOSIS — M109 Gout, unspecified: Secondary | ICD-10-CM | POA: Insufficient documentation

## 2013-02-20 DIAGNOSIS — Z888 Allergy status to other drugs, medicaments and biological substances status: Secondary | ICD-10-CM | POA: Insufficient documentation

## 2013-02-20 DIAGNOSIS — Z87891 Personal history of nicotine dependence: Secondary | ICD-10-CM | POA: Insufficient documentation

## 2013-02-20 DIAGNOSIS — J449 Chronic obstructive pulmonary disease, unspecified: Secondary | ICD-10-CM | POA: Insufficient documentation

## 2013-02-20 DIAGNOSIS — J4489 Other specified chronic obstructive pulmonary disease: Secondary | ICD-10-CM | POA: Insufficient documentation

## 2013-02-20 DIAGNOSIS — G4730 Sleep apnea, unspecified: Secondary | ICD-10-CM | POA: Insufficient documentation

## 2013-02-20 DIAGNOSIS — Z885 Allergy status to narcotic agent status: Secondary | ICD-10-CM | POA: Insufficient documentation

## 2013-02-20 DIAGNOSIS — Z79899 Other long term (current) drug therapy: Secondary | ICD-10-CM | POA: Insufficient documentation

## 2013-02-20 DIAGNOSIS — I10 Essential (primary) hypertension: Secondary | ICD-10-CM | POA: Insufficient documentation

## 2013-02-20 DIAGNOSIS — Z8619 Personal history of other infectious and parasitic diseases: Secondary | ICD-10-CM | POA: Insufficient documentation

## 2013-02-20 DIAGNOSIS — K219 Gastro-esophageal reflux disease without esophagitis: Secondary | ICD-10-CM | POA: Insufficient documentation

## 2013-02-20 MED ORDER — HYDROCODONE-ACETAMINOPHEN 5-325 MG PO TABS
2.0000 | ORAL_TABLET | Freq: Once | ORAL | Status: AC
  Administered 2013-02-20: 2 via ORAL
  Filled 2013-02-20: qty 2

## 2013-02-20 MED ORDER — INDOMETHACIN 25 MG PO CAPS: 25.0000 mg | ORAL_CAPSULE | Freq: Three times a day (TID) | ORAL | Status: DC | PRN

## 2013-02-20 MED ORDER — OXYCODONE-ACETAMINOPHEN 5-325 MG PO TABS: 1.0000 | ORAL_TABLET | Freq: Four times a day (QID) | ORAL | Status: DC | PRN

## 2013-02-20 MED ORDER — INDOMETHACIN 25 MG PO CAPS
25.0000 mg | ORAL_CAPSULE | Freq: Once | ORAL | Status: AC
  Administered 2013-02-20: 25 mg via ORAL
  Filled 2013-02-20: qty 1

## 2013-02-20 NOTE — ED Notes (Signed)
Faxed information requested by [facility].  Phone number [phone]. Fax [phone]. Information requested height/weight, copy of insurance card, face sheet with new address [address] [place] [address] Diagnosis Gout, and Provider NPI Number.  Given [facility] phone to patient.

## 2013-02-20 NOTE — ED Notes (Signed)
Given patient's crutches however spoke with Ortho who will provide a different set for patient.

## 2013-02-20 NOTE — ED Provider Notes (Signed)
CSN: [id number]     Arrival date & time 02/20/13  1416 History  This chart was scribed for non-physician practitioner Dorthula Matas, PA-C working with Roney Marion, MD by Leone Payor and Dorothey Baseman, ED Scribe. This patient was seen in room TR05C/TR05C and the patient's care was started at 1416.    Chief Complaint  Patient presents with  . Foot Pain    The history is provided by the patient. No language interpreter was used.    HPI Comments: [name] is a 42 y.o. female who presents to the Emergency Department complaining of constant, unchanged right foot pain that began yesterday. Pt denies any injuries or trauma to the affected area. Pt states she dislocated the same ankle when she was 42 years old. She denies nausea, emesis, fever, weakness, numbness. She denies history of gout.    Past Medical History  Diagnosis Date  . Acanthosis nigricans   . Menorrhagia   . Hypertension, essential   . Insomnia   . Morbid obesity   . Helicobacter pylori (H. pylori) infection   . Sleep apnea     Sleep study 2008 - mild OSA, not enough events to titrate CPAP  . COPD (chronic obstructive pulmonary disease)     PFTs in 2002, FEV1/FVC 65, no post bronchodilater test done  . Asthma     Followed by Dr. Maple Hudson (pulmonology); receives every other week omalizumab injections; has frequent exacerbations  . Depression   . GERD (gastroesophageal reflux disease)   . Tobacco user   . Obesity    Past Surgical History  Procedure Laterality Date  . Tubal ligation  1996    bilateral  . Breast reduction surgery  09/2011   Family History  Problem Relation Age of Onset  . Asthma Daughter   . Cancer Paternal Aunt   . Asthma Maternal Grandmother   . Hypertension Mother    History  Substance Use Topics  . Smoking status: Former Smoker -- 0.25 packs/day for 9 years    Types: Cigarettes    Quit date: 10/04/2012  . Smokeless tobacco: Never Used  . Alcohol Use: No   OB History   Grav Para Term  Preterm Abortions TAB SAB Ect Mult Living                 Review of Systems  Constitutional: Negative for fever.  Gastrointestinal: Negative for nausea and vomiting.  Neurological: Negative for weakness and numbness.  All other systems reviewed and are negative.    Allergies  Ace inhibitors and Percocet  Home Medications   Current Outpatient Rx  Name  Route  Sig  Dispense  Refill  . albuterol (PROVENTIL HFA;VENTOLIN HFA) 108 (90 BASE) MCG/ACT inhaler   Inhalation   Inhale 2 puffs into the lungs every 6 (six) hours as needed for wheezing.         . hydrochlorothiazide (HYDRODIURIL) 25 MG tablet   Oral   Take 25 mg by mouth daily.         . mometasone-formoterol (DULERA) 100-5 MCG/ACT AERO   Inhalation   Inhale 2 puffs into the lungs 2 (two) times daily.         [name] omalizumab (XOLAIR) 150 MG injection   Subcutaneous   Inject 225 mg into the skin every 14 (fourteen) days.         [name] omeprazole (PRILOSEC) 20 MG capsule   Oral   Take 20 mg by mouth as needed (heartburn).         [name]  predniSONE (DELTASONE) 10 MG tablet   Oral   Take 20 mg by mouth 2 (two) times daily.         . indomethacin (INDOCIN) 25 MG capsule   Oral   Take 1 capsule (25 mg total) by mouth 3 (three) times daily as needed.   30 capsule   0   . oxyCODONE-acetaminophen (PERCOCET/ROXICET) 5-325 MG per tablet   Oral   Take 1 tablet by mouth every 6 (six) hours as needed for pain.   20 tablet   0    BP 149/87  Pulse 94  Temp(Src) 98.7 F (37.1 C) (Oral)  Resp 16  Ht 5\' 6"  (1.676 m)  Wt 359 lb 3 oz (162.926 kg)  BMI 58 kg/m2  SpO2 100%  LMP 02/08/2013 Physical Exam  Nursing note and vitals reviewed. Constitutional: She appears well-developed and well-nourished. No distress.  HENT:  Head: Normocephalic and atraumatic.  Eyes: Pupils are equal, round, and reactive to light.  Neck: Normal range of motion. Neck supple.  Cardiovascular: Normal rate and regular rhythm.    Pulmonary/Chest: Effort normal.  Abdominal: Soft.  Musculoskeletal:       Right ankle: She exhibits swelling. She exhibits normal range of motion, no ecchymosis, no deformity, no laceration and normal pulse. Tenderness. Lateral malleolus and medial malleolus tenderness found. Achilles tendon normal.  Neurological: She is alert.  Skin: Skin is warm and dry.    ED Course  Procedures  DIAGNOSTIC STUDIES: Oxygen Saturation is 100% on RA, normal by my interpretation.    COORDINATION OF CARE: 3:40 PM Will order XRAY of the right foot/ankle also pain medication. Discussed treatment plan with pt at bedside and pt agreed to plan.   5:52 PM- Discussed negative x-ray results and that symptoms are likely due to gout. Will discharge patient with crutches, pain medications, and indomethacin. Advised patient to follow up with her PCP on Monday. Discussed treatment plan with patient at bedside and patient verbalized agreement.    Labs Review Labs Reviewed - No data to display  Imaging Review Dg Foot Complete Right  02/20/2013   CLINICAL DATA:  Right foot pain on top of foot, no known trauma  EXAM: RIGHT FOOT COMPLETE - 3+ VIEW  COMPARISON:  None.  FINDINGS: Mild arthritis at the 1st tarsal metatarsal joint. No fracture or dislocation. Mild tibiotalar arthritis. Mild talonavicular arthritis. Small heel spur.  IMPRESSION: No acute findings. Degenerative changes.   Electronically Signed   By: Esperanza Heir M.D.   On: 02/20/2013 17:40    EKG Interpretation   None       MDM   1. Gout    42 y.o.Meta N Marcoe's evaluation in the Emergency Department is complete. It has been determined that no acute conditions requiring further emergency intervention are present at this time. The patient/guardian have been advised of the diagnosis and plan. We have discussed signs and symptoms that warrant return to the ED, such as changes or worsening in symptoms.  Vital signs are stable at discharge. Filed  Vitals:   02/20/13 1644  BP: 149/87  Pulse: 94  Temp: 98.7 F (37.1 C)  Resp: 16    Patient/guardian has voiced understanding and agreed to follow-up with the PCP or specialist.  I personally performed the services described in this documentation, which was scribed in my presence. The recorded information has been reviewed and is accurate.   Dorthula Matas, PA-C 02/20/13 1757

## 2013-02-20 NOTE — ED Notes (Signed)
Pt reports that she started having pain in the bottom of her right foot last night. Pt reports increased pain with weight bearing. Denies any injury to the area, but reports some swelling.

## 2013-02-20 NOTE — ED Notes (Signed)
Crutches in ED max 300 pounds. Spoke with Ortho do not currently have in stock notified PA.  Called [facility] [phone] Able to deliver crutches to patient's home because patient does not want to wait.

## 2013-02-20 NOTE — ED Notes (Signed)
Called x-ray will be send for patient shortly.

## 2013-02-22 ENCOUNTER — Ambulatory Visit (INDEPENDENT_AMBULATORY_CARE_PROVIDER_SITE_OTHER): Payer: Commercial Managed Care - PPO | Admitting: Internal Medicine

## 2013-02-22 ENCOUNTER — Encounter: Payer: Self-pay | Admitting: Internal Medicine

## 2013-02-22 VITALS — BP 112/67 | HR 80 | Temp 99.7°F | Ht 66.0 in | Wt 368.8 lb

## 2013-02-22 DIAGNOSIS — M25579 Pain in unspecified ankle and joints of unspecified foot: Secondary | ICD-10-CM

## 2013-02-22 DIAGNOSIS — M25571 Pain in right ankle and joints of right foot: Secondary | ICD-10-CM

## 2013-02-22 MED ORDER — PREDNISONE 10 MG PO TABS: ORAL_TABLET | ORAL | Status: DC

## 2013-02-22 NOTE — Progress Notes (Signed)
HPI The patient is a 42 y.o. female with a history of asthma, HTN, sleep apnea, presenting for an ED follow-up for gout.  The patient notes that 3 days ago, while washing clothes, she developed gradual onset of right foot pain, which worsened as the patient walked on it.  Pain is an "aching" pain on her midfoot and heel, present all the time, worse with weight bearing.  She notes no fall, trauma, fevers.  She has no history of gout, and no significant FH of gout.  The patient tried indomethacin, taken 3 times per day, but notes no relief of symptoms.  She was also given oxycodone 20, but notes no relief with this either.  ROS: General: no fevers, chills, changes in weight, changes in appetite Skin: no rash HEENT: no blurry vision, hearing changes, sore throat Pulm: no dyspnea, coughing, wheezing CV: no chest pain, palpitations, shortness of breath Abd: no abdominal pain, nausea/vomiting, diarrhea/constipation GU: no dysuria, hematuria, polyuria Ext: see HPI Neuro: no weakness, numbness, or tingling  Filed Vitals:   02/22/13 1527  BP: 112/67  Pulse: 80  Temp: 99.7 F (37.6 C)    PEX General: alert, cooperative, and in no apparent distress HEENT: pupils equal round and reactive to light, vision grossly intact, oropharynx clear and non-erythematous  Neck: supple Lungs: clear to ascultation bilaterally, normal work of respiration, no wheezes, rales, ronchi Heart: regular rate and rhythm, no murmurs, gallops, or rubs Abdomen: soft, non-tender, non-distended, normal bowel sounds Extremities: Right midfoot and ankle with non-pitting edema, with ttp of entire midfoot (dorsal and plantar surfaces) and ankle.  No erythema, superficial breaks in the skin, or drainage Neurologic: alert & oriented X3, cranial nerves II-XII intact, strength grossly intact, sensation intact to light touch  Current Outpatient Prescriptions on File Prior to Visit  Medication Sig Dispense Refill  . albuterol  (PROVENTIL HFA;VENTOLIN HFA) 108 (90 BASE) MCG/ACT inhaler Inhale 2 puffs into the lungs every 6 (six) hours as needed for wheezing.      . hydrochlorothiazide (HYDRODIURIL) 25 MG tablet Take 25 mg by mouth daily.      . indomethacin (INDOCIN) 25 MG capsule Take 1 capsule (25 mg total) by mouth 3 (three) times daily as needed.  30 capsule  0  . mometasone-formoterol (DULERA) 100-5 MCG/ACT AERO Inhale 2 puffs into the lungs 2 (two) times daily.      [name] omalizumab (XOLAIR) 150 MG injection Inject 225 mg into the skin every 14 (fourteen) days.      [name] omeprazole (PRILOSEC) 20 MG capsule Take 20 mg by mouth as needed (heartburn).      [name] oxyCODONE-acetaminophen (PERCOCET/ROXICET) 5-325 MG per tablet Take 1 tablet by mouth every 6 (six) hours as needed for pain.  20 tablet  0  . predniSONE (DELTASONE) 10 MG tablet Take 20 mg by mouth 2 (two) times daily.       No current facility-administered medications on file prior to visit.    Assessment/Plan

## 2013-02-22 NOTE — Assessment & Plan Note (Addendum)
The patient notes an acute 3 day history of right ankle and foot pain, with no trauma, fevers, or redness. Differential most likely includes gout versus other inflammatory arthritis.  Septic joint is extremely unlikely, given time frame progression of symptoms, lack of erythema, and absence of fevers.  After informed consent was obtained, the patient's right foot was cleaned with Betadine. A 23-gauge needle was used to inject 1% lidocaine without epi into the skin over the right ankle, between the medial malleolus and the tibialis anterior tendon.  A 23-gauge needle was advanced, aspirating with advancement, but was unable to obtain fluid. A 1.5 inch 22-gauge needle was then inserted, which advanced further, though again no joint fluid was aspirated. The procedure was stopped secondary to pain.  The area was bandaged.  Since no joint fluid could be obtained for analysis, we will have to treat empirically. -start prednisone taper, starting at 40 mg daily -stop indomethacin, since this medication did not improve symptoms -pt may continue to take oxycodone prn -return to care in 3-4 days for follow-up

## 2013-02-22 NOTE — Patient Instructions (Signed)
General Instructions: Your joint pain may be due to Gout, or another inflammatory arthritis.  We were unable to obtain fluid from your ankle joint to confirm this diagnosis.  To treat your pain: -take Prednisone in gradually decreasing doses --Day 1: Take 4 tablets once per day --Day 2: Take 4 tablets once per day --Day 3: Take 3 tablets once per day --Day 4: Take 3 tablets once per day --Day 5: Take 2 tablets once per day --Day 6: Take 2 tablets once per day --Day 7: Take 1 tablet once per day --Day 8: Take 1 tablet once per day  You may stop taking Indomethacin.  Please return for a follow-up visit in 3-4 days.  If your symptoms have resolved, you may cancel this appointment.    Treatment Goals:  Goals (1 Years of Data) as of 02/22/13   None      Progress Toward Treatment Goals:  Treatment Goal 02/22/2013  Blood pressure at goal    Self Care Goals & Plans:  Self Care Goal 10/28/2012  Manage my medications take my medicines as prescribed; bring my medications to every visit  Monitor my health keep track of my weight; keep track of my blood pressure  Eat healthy foods -  Be physically active find an activity I enjoy    No flowsheet data found.   Care Management & Community Referrals:  Referral 02/22/2013  Referrals made for care management support none needed  Referrals made to community resources -

## 2013-02-23 NOTE — Progress Notes (Signed)
I saw and evaluated the patient.  I personally confirmed the key portions of Dr. Theora Gianotti history and exam and reviewed pertinent patient test results.  The assessment, diagnosis, and plan were formulated together and I agree with the documentation in the resident's note.  I was present at the bedside for the entire attempted ankle arthrocentesis procedure.

## 2013-02-24 ENCOUNTER — Ambulatory Visit: Payer: Commercial Managed Care - PPO

## 2013-02-26 ENCOUNTER — Encounter: Payer: Self-pay | Admitting: Internal Medicine

## 2013-02-26 ENCOUNTER — Ambulatory Visit (INDEPENDENT_AMBULATORY_CARE_PROVIDER_SITE_OTHER): Payer: Commercial Managed Care - PPO | Admitting: Internal Medicine

## 2013-02-26 VITALS — BP 189/106 | HR 80 | Temp 98.0°F | Wt 370.5 lb

## 2013-02-26 DIAGNOSIS — I10 Essential (primary) hypertension: Secondary | ICD-10-CM

## 2013-02-26 DIAGNOSIS — M25579 Pain in unspecified ankle and joints of unspecified foot: Secondary | ICD-10-CM

## 2013-02-26 DIAGNOSIS — M25571 Pain in right ankle and joints of right foot: Secondary | ICD-10-CM

## 2013-02-26 MED ORDER — OXYCODONE-ACETAMINOPHEN 10-325 MG PO TABS: 1.0000 | ORAL_TABLET | Freq: Four times a day (QID) | ORAL | Status: DC | PRN

## 2013-02-26 NOTE — Assessment & Plan Note (Signed)
The patient notes continued right ankle pain, unchanged by prednisone. This makes the diagnosis of gout less likely. Her symptoms still seem consistent with an inflammatory arthritis versus muscle/ligament strain. Low suspicion for skin/joint infection, given lack of erythema, lack of fever, and her relatively good range of motion of her ankle. -We will refer to sports medicine at this time, since our initial treatment was ineffective, for further evaluation and treatment -Refilled oxycodone at a higher dose, at 10-325 mg, 1 tab every 6 hours as needed for pain -Patient may continue indomethacin as needed for pain relief -Encouraged ice, compression, and elevation

## 2013-02-26 NOTE — Assessment & Plan Note (Signed)
BP Readings from Last 3 Encounters:  02/26/13 189/106  02/22/13 112/67  02/20/13 143/85    Lab Results  Component Value Date   NA 139 02/15/2013   K 3.7 02/15/2013   CREATININE 1.00 02/15/2013    Assessment: Blood pressure control: moderately elevated Progress toward BP goal:  deteriorated Comments: The patient's blood pressure is elevated today. Interestingly, at her last visit, the patient's blood pressure was 112/67. The patient is currently not on any treatment for hypertension, previously on HCTZ, which the patient self discontinued some time ago.  The patient's blood pressure is slightly elevated today due to pain and steroid usage, and his blood pressure was well-controlled last week, we'll defer starting antihypertensive medications until her followup visit. HCTZ is a reasonable choice, as long as we are confident that her right ankle pain is not due to gout (obtaining assistance from sports medicine)  Plan: Medications:  No medication prescribed Educational resources provided:   Self management tools provided:   Other plans: Recheck at next visit

## 2013-02-26 NOTE — Progress Notes (Signed)
HPI The patient is a 42 y.o. female with a history of obesity, tobacco abuse, HTN, presenting for a follow-up visit for right ankle pain.  The patient developed R ankle pain 7 days ago.  Four days ago, she presented to care, was thought to have a gout flare, and was given a prednisone taper. Attempted right ankle joint aspiration at that time was unsuccessful.  The patient has taken her prednisone taper, but still notes a "pulling" pain sensation in her ankle, 10/10, unchanged.  Oxycodone hasn't relieved her pain, despite tripling the dosage on her own. She notes no fevers, erythema, or drainage.  ROS: General: no fevers, chills, changes in weight, changes in appetite Skin: no rash HEENT: no blurry vision, hearing changes, sore throat Pulm: no dyspnea, coughing, wheezing CV: no chest pain, palpitations, shortness of breath Abd: no abdominal pain, nausea/vomiting, diarrhea/constipation GU: no dysuria, hematuria, polyuria Ext: see HPI Neuro: no weakness, numbness, or tingling  Filed Vitals:   02/26/13 1332  BP: 189/106  Pulse: 80  Temp: 98 F (36.7 C)    PEX General: alert, cooperative, and in no apparent distress HEENT: pupils equal round and reactive to light, vision grossly intact, oropharynx clear and non-erythematous  Neck: supple Lungs: clear to ascultation bilaterally, normal work of respiration, no wheezes, rales, ronchi Heart: regular rate and rhythm, no murmurs, gallops, or rubs Abdomen: soft, non-tender, non-distended, normal bowel sounds Extremities: Right ankle with edema and diffuse pain to palpation, with no erythema. No pain on palpation of plantar fascia or calcaneus  Neurologic: alert & oriented X3, cranial nerves II-XII intact, strength grossly intact, sensation intact to light touch  Current Outpatient Prescriptions on File Prior to Visit  Medication Sig Dispense Refill  . albuterol (PROVENTIL HFA;VENTOLIN HFA) 108 (90 BASE) MCG/ACT inhaler Inhale 2 puffs into  the lungs every 6 (six) hours as needed for wheezing.      . hydrochlorothiazide (HYDRODIURIL) 25 MG tablet Take 25 mg by mouth daily.      . indomethacin (INDOCIN) 25 MG capsule Take 1 capsule (25 mg total) by mouth 3 (three) times daily as needed.  30 capsule  0  . mometasone-formoterol (DULERA) 100-5 MCG/ACT AERO Inhale 2 puffs into the lungs 2 (two) times daily.      [name] omalizumab (XOLAIR) 150 MG injection Inject 225 mg into the skin every 14 (fourteen) days.      [name] omeprazole (PRILOSEC) 20 MG capsule Take 20 mg by mouth as needed (heartburn).      [name] oxyCODONE-acetaminophen (PERCOCET/ROXICET) 5-325 MG per tablet Take 1 tablet by mouth every 6 (six) hours as needed for pain.  20 tablet  0  . predniSONE (DELTASONE) 10 MG tablet Take by mouth 4 tabs per day for 2 days, then 3 tabs/day x2 days, then 2 tabs/day x2 days, then 1 tab/day x2 days  20 tablet  0   No current facility-administered medications on file prior to visit.    Assessment/Plan

## 2013-02-26 NOTE — ED Provider Notes (Signed)
Medical screening examination/treatment/procedure(s) were performed by non-physician practitioner and as supervising physician I was immediately available for consultation/collaboration.  EKG Interpretation   None         Roney Marion, MD 02/26/13 2004

## 2013-02-26 NOTE — Patient Instructions (Signed)
General Instructions: Your ankle remains inflamed. We are sending you to Sports Medicine for further evaluation -in the meantime, we are refilling your Oxycodone-Acetaminophen medication.  Take 1 tablet, up to every 6 hours as needed for pain -other measures you can take to improve you pain include wrapping the area, icing the area (using ice for 10 minutes at a time, followed by a 10 minute break), and keeping the area elevated  Please return for a follow-up visit in 2-3 weeks.   Treatment Goals:  Goals (1 Years of Data) as of 02/26/13   None      Progress Toward Treatment Goals:  Treatment Goal 02/26/2013  Blood pressure deteriorated    Self Care Goals & Plans:  Self Care Goal 10/28/2012  Manage my medications take my medicines as prescribed; bring my medications to every visit  Monitor my health keep track of my weight; keep track of my blood pressure  Eat healthy foods -  Be physically active find an activity I enjoy    No flowsheet data found.   Care Management & Community Referrals:  Referral 02/26/2013  Referrals made for care management support none needed  Referrals made to community resources -

## 2013-03-03 ENCOUNTER — Ambulatory Visit (INDEPENDENT_AMBULATORY_CARE_PROVIDER_SITE_OTHER): Payer: Commercial Managed Care - PPO

## 2013-03-03 DIAGNOSIS — J45909 Unspecified asthma, uncomplicated: Secondary | ICD-10-CM

## 2013-03-04 MED ORDER — OMALIZUMAB 150 MG ~~LOC~~ SOLR
225.0000 mg | Freq: Once | SUBCUTANEOUS | Status: AC
  Administered 2013-03-04: 225 mg via SUBCUTANEOUS

## 2013-03-08 NOTE — Progress Notes (Signed)
Case discussed with Dr. Brown at the time of the visit.  We reviewed the resident's history and exam and pertinent patient test results.  I agree with the assessment, diagnosis and plan of care documented in the resident's note. 

## 2013-03-09 ENCOUNTER — Encounter: Payer: Self-pay | Admitting: Family Medicine

## 2013-03-09 ENCOUNTER — Ambulatory Visit (INDEPENDENT_AMBULATORY_CARE_PROVIDER_SITE_OTHER): Payer: Commercial Managed Care - PPO | Admitting: Family Medicine

## 2013-03-09 VITALS — BP 151/81 | Ht 66.0 in | Wt 320.0 lb

## 2013-03-09 DIAGNOSIS — M25579 Pain in unspecified ankle and joints of unspecified foot: Secondary | ICD-10-CM

## 2013-03-09 DIAGNOSIS — M25571 Pain in right ankle and joints of right foot: Secondary | ICD-10-CM

## 2013-03-09 MED ORDER — DICLOFENAC SODIUM 75 MG PO TBEC: 75.0000 mg | DELAYED_RELEASE_TABLET | Freq: Two times a day (BID) | ORAL | Status: DC

## 2013-03-09 NOTE — Patient Instructions (Signed)
Thank you for coming in today  I think you have ankle synovitis - irritation of the lining the ankle joint You have some spurs that are causing pinching in the joint Take diclofenac 2x per day for 14 days then as needed Use ankle brace when on your feet Ice the ankle 2x per day Try sport insoles  Followup 4 weeks.

## 2013-03-09 NOTE — Progress Notes (Signed)
CC: Right ankle pain HPI: Patient is a 42 year old female who is new to our clinic who presents for evaluation of right ankle pain x3 weeks. She states that this started without any injury or trigger. The only thing she can think of that may have been different and she did a lot of walking barefoot on rocks with her nephew. She was seen in the ED where she was initially diagnosed with gout and was started on prednisone and indomethacin. She states that these did not really help her. She was then seen in her primary care clinic where she was given some oxycodone which does help with her pain. She did have some x-rays of her foot that were remarkable for degenerative change at the first tarsometatarsal joint as well as prominent anterior spurring off the tibia and talus. She states that she has been wearing an Ace wrap. Overall her pain has improved some. 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 She denies any previous injury to the ankle. She does state that it has been very swollen but seems to be somewhat better.  ROS: As above in the HPI. All other systems are stable or negative.  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 She is a former smoker but is no longer smoking. No alcohol use. Family: Asthma and hypertension  Allergies: ACE inhibitors    OBJECTIVE: APPEARANCE:  Patient in no acute distress.The patient appeared well nourished and normally developed. HEENT: No scleral icterus. Conjunctiva non-injected Resp: Non labored Skin: No rash MSK:  Right Ankle: Right ankle appears puffy as compared to the left. There is no erythema or warmth. Range of motion is full in all directions with significant pain Strength is 5/5 in all directions with significant pain Stable lateral and medial  ligaments; squeeze test and kleiger test unremarkable; Talar dome tender; She has diffuse tenderness to palpation over the medial and lateral ankle as well as the anterior ankle around the ankle joint itself. She is also tender over the medial and lateral tendons  She does have some collapse of the longitudinal arch with over pronation Able to walk 4 steps.   MSK [place]: Ultrasound of the right ankle was performed in transverse and longitudinal views. There is some hypoechoic change surrounding the posterior tib tendon. Lateral ankle tendons are normal in appearance. There is no obvious large ankle effusion although she does have increased grayscale and irregularity concerning for degenerative change and chronic synovitis. There is a large anterior spur off of the talus visualized is hyperechoic signal.   ASSESSMENT: #1. Acute ankle pain in the setting of chronic degenerative changes and likely chronic synovitis with evidence of anterior spur likely resulting in ankle impingement  PLAN: We will obtain x-rays of the patient's right ankle as her previous films were of her foot. She was given a lace up ankle brace to wear while on her feet for additional support to reduce the fine movement of the ankle and help reduce synovitis. We will start her on diclofenac 2 times per day for the next 2 weeks and then as needed. I also added sport insoles with small scaphoid pad for her to use in her shoes. She requested a refill of her oxycodone and I explained to her that I did not think that this was a appropriate medicine for  her pain condition and that I think anti-inflammatories will be more appropriate. We will see her back in 4 weeks for reevaluation.

## 2013-03-13 IMAGING — CR DG CHEST 2V
2 series · 2 of 2 positions shown · non-contrast
Comparison: Two-view chest 12/31/2011.

CLINICAL DATA: Asthma attack.  Shortness of breath.

CHEST - 2 VIEW

[w chest pa]
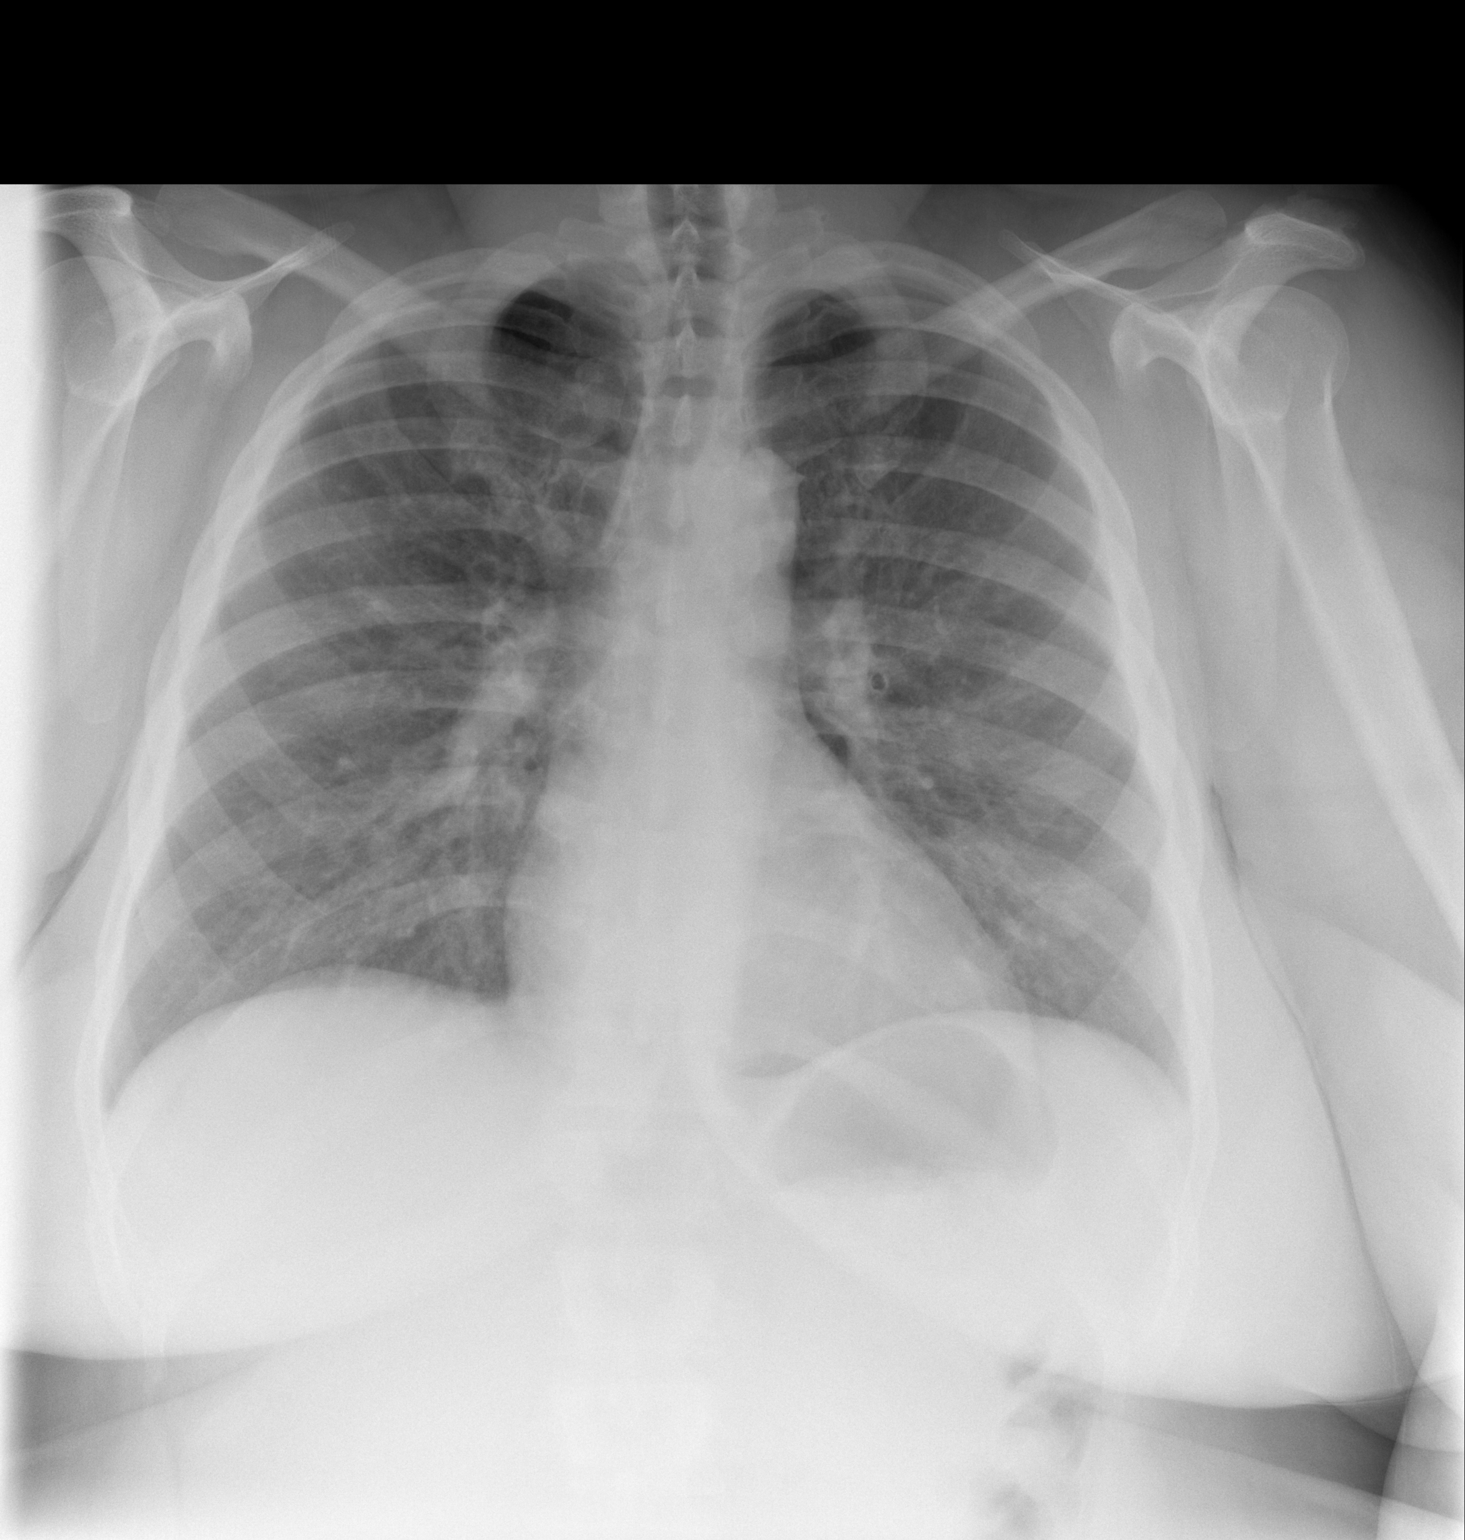

[w chest lat]
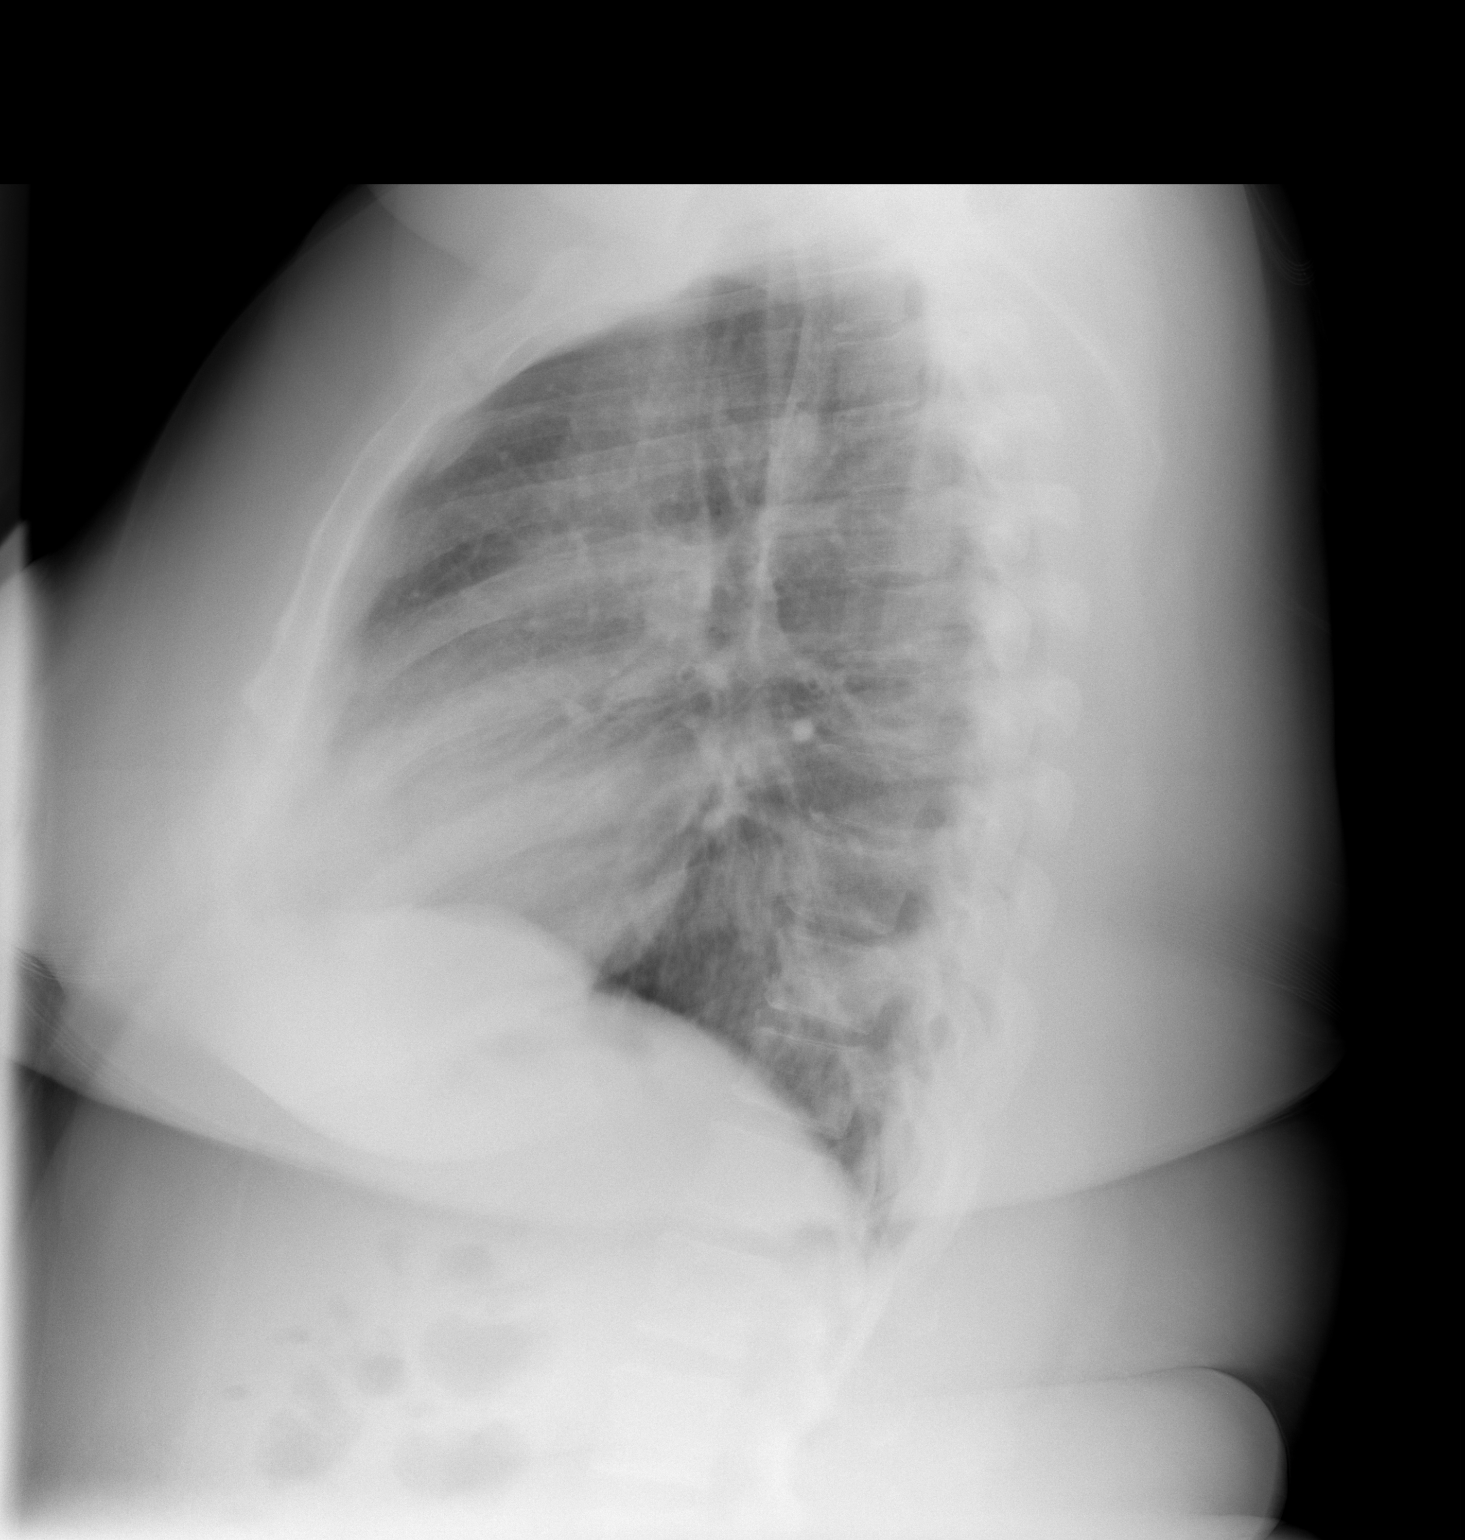

[2 of 2 positions shown; findings below may reference images not displayed]

FINDINGS: The heart size is normal.  Mild perihilar bronchitic
changes are present.  No focal airspace consolidation is evident.
The visualized soft tissues and bony thorax are unremarkable.
IMPRESSION: 1.  Mild perihilar bronchitic changes as can be seen and reactive
airways disease.
2.  No acute airspace disease.

## 2013-03-15 ENCOUNTER — Encounter: Payer: Self-pay | Admitting: Internal Medicine

## 2013-03-15 ENCOUNTER — Ambulatory Visit (INDEPENDENT_AMBULATORY_CARE_PROVIDER_SITE_OTHER): Payer: Commercial Managed Care - PPO | Admitting: Internal Medicine

## 2013-03-15 VITALS — BP 170/103 | HR 79 | Temp 98.2°F | Ht 66.0 in | Wt 363.3 lb

## 2013-03-15 DIAGNOSIS — M25571 Pain in right ankle and joints of right foot: Secondary | ICD-10-CM

## 2013-03-15 DIAGNOSIS — M25579 Pain in unspecified ankle and joints of unspecified foot: Secondary | ICD-10-CM

## 2013-03-15 MED ORDER — IBUPROFEN 200 MG PO TABS: 600.0000 mg | ORAL_TABLET | Freq: Four times a day (QID) | ORAL | Status: DC | PRN

## 2013-03-15 MED ORDER — OXYCODONE-ACETAMINOPHEN 10-325 MG PO TABS: 1.0000 | ORAL_TABLET | Freq: Two times a day (BID) | ORAL | Status: DC | PRN

## 2013-03-15 NOTE — Patient Instructions (Addendum)
Thank you for your visit.  I prescribed percocet 10-325mg  to be taken twice per day as needed for pain. I also prescribed ibuprofen 600mg  every 6 hours as needed. DO NOT take ibuprofen and the diclofenac.   Make sure to take the omeprazole every day as the ibuprofen and diclofenac can increase your risk of stomach irritation.  Please follow up with your PCP in 3 months.

## 2013-03-15 NOTE — Assessment & Plan Note (Signed)
Patient complaining of continued constant,achy right ankle pain x one month. Given patient not respond to prednisone taper or anti-inflammatories, I do not believe this episode represents gout. Additionally, the timeframe is not consistent with gout. Patient does not remember injuring her ankle however this sounds like musculoskeletal pain. Previous x-ray and ankle ultrasound showed degenerative changes to her right ankle, may be causing her symptoms. Patient has been unable to work her full hours at [employer] secondary to this pain. In order for her to continue working, I will prescribe Percocet 10-325 twice a day when necessary, #30. I will not plan to refill this medication. Patient understands that this medication should last her the entire month until she is able to see sports medicine again in December. Patient reports that diclofenac does not work to control her pain, therefore I prescribed ibuprofen 600 mg every 6 hours when necessary in place of diclofenac. Patient understands that she is not to take the diclofenac and ibuprofen together. Patient agrees to take her Prilosec daily during the period she is taking NSAIDs.

## 2013-03-15 NOTE — Progress Notes (Signed)
Patient ID: April [name], female   DOB: [birth year], 42 y.o.   MRN: [id number] HPI The patient is a 42 y.o. female with a history of hypertension, chronic back pain, sleep apnea, morbid obesity, anemia presents to the clinic for an acute visit.  Patient has had right constant, aching foot pain for one month. She was evaluated in the emergency department in late October at which point she was given a prednisone taper and indomethacin for presumed gout.X-rays of her foot were remarkable for degenerative change at the first tarsometatarsal joint as well as prominent anterior spurring off the tibia and talus. Patient was then evaluated in our clinic by Dr. Manson Passey on October 31 and given Percocet and referral to sports medicine. Patient was seen by sports medicine on 03/09/2013 where she had a R ankle US showing hypoechoic change surrounding the posterior tib tendon and increased grayscale and irregularity concerning for degenerative change and chronic synovitis; large anterior spur off of the talus. At this visit she was given dicolofenac BID x 2 weeks, then to be taken as needed. They also gave her sport insoles w/ scaphoid pad in her shoes, which she has been wearing. She will f/u with them in 4 weeks. Sports medicine also ordered ankle x-rays that patient reports she will have done before her followup appointment in 4 weeks. Patient reports she's been taking the diclofenac as prescribed, but this has not been helping her pain. Patient reports the Percocet has helped her pain but most of all pain medication she is received. She also has been elevating her foot and icing it intermittently. Patient thinks the swelling has decreased since onset of the pain. She has no history of injury to her foot. 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 She has been having to cut back her hours at [employer] since his job requires her to stand on her feet for long hours.  ROS: General: no fevers,  chills, changes in weight, changes in appetite Skin: no rash HEENT: no blurry vision, hearing changes, sore throat Pulm: no dyspnea, coughing, wheezing CV: no chest pain, palpitations, shortness of breath Abd: no abdominal pain, nausea/vomiting, diarrhea/constipation GU: no dysuria, hematuria, polyuria Ext: see HPI Neuro: no weakness, numbness, or tingling  Filed Vitals:   03/15/13 1527  BP: 170/103  Pulse: 79  Temp: 98.2 F (36.8 C)   PEX General: alert, cooperative, and in no apparent distress HEENT: pupils equal round and reactive to light, vision grossly intact, oropharynx clear and non-erythematous  Neck: supple, no lymphadenopathy Lungs: clear to ascultation bilaterally, normal work of respiration, no wheezes, rales, ronchi Heart: regular rate and rhythm, no murmurs, gallops, or rubs Abdomen: soft, obese, non-tender, non-distended, normal bowel sounds Extremities: no BLE edema, extremities warm; TTP over dorsal aspect of R ankle, worst over medial ankle; active and passive ROM intact; no erythema; mild swelling to R medial ankle  Neurologic: alert & oriented X3, cranial nerves II-XII grossly intact, strength grossly intact, sensation intact to light touch  Current Outpatient Prescriptions on File Prior to Visit  Medication Sig Dispense Refill  . albuterol (PROVENTIL HFA;VENTOLIN HFA) 108 (90 BASE) MCG/ACT inhaler Inhale 2 puffs into the lungs every 6 (six) hours as needed for wheezing.      . diclofenac (VOLTAREN) 75 MG EC tablet Take 1 tablet (75 mg total) by mouth 2 (two) times daily.  60 tablet  3  . mometasone-formoterol (DULERA) 100-5 MCG/ACT AERO Inhale 2 puffs into  the lungs 2 (two) times daily.      [name] omalizumab (XOLAIR) 150 MG injection Inject 225 mg into the skin every 14 (fourteen) days.      [name] omeprazole (PRILOSEC) 20 MG capsule Take 20 mg by mouth as needed (heartburn).      [name] oxyCODONE-acetaminophen (PERCOCET) 10-325 MG per tablet Take 1 tablet by mouth every 6  (six) hours as needed for pain.  40 tablet  0  . predniSONE (DELTASONE) 10 MG tablet Take by mouth 4 tabs per day for 2 days, then 3 tabs/day x2 days, then 2 tabs/day x2 days, then 1 tab/day x2 days  20 tablet  0   No current facility-administered medications on file prior to visit.    Assessment/Plan

## 2013-03-17 NOTE — Progress Notes (Signed)
I saw and evaluated the patient.  I personally confirmed the key portions of the history and exam documented by Dr. Chikowski and I reviewed pertinent patient test results.  The assessment, diagnosis, and plan were formulated together and I agree with the documentation in the resident's note. 

## 2013-03-18 ENCOUNTER — Ambulatory Visit: Payer: Commercial Managed Care - PPO

## 2013-03-19 ENCOUNTER — Other Ambulatory Visit: Payer: Self-pay | Admitting: *Deleted

## 2013-03-19 MED ORDER — OMALIZUMAB 150 MG ~~LOC~~ SOLR: 225.0000 mg | SUBCUTANEOUS | Status: DC

## 2013-03-23 ENCOUNTER — Ambulatory Visit (INDEPENDENT_AMBULATORY_CARE_PROVIDER_SITE_OTHER): Payer: Commercial Managed Care - PPO

## 2013-03-23 ENCOUNTER — Telehealth: Payer: Self-pay | Admitting: Internal Medicine

## 2013-03-23 DIAGNOSIS — J45909 Unspecified asthma, uncomplicated: Secondary | ICD-10-CM

## 2013-03-23 NOTE — Telephone Encounter (Signed)
I went up front to give samples to the pt and she had left. Called her and let her know that we have samples of Dulera for her to pick up.

## 2013-03-24 MED ORDER — OMALIZUMAB 150 MG ~~LOC~~ SOLR
225.0000 mg | Freq: Once | SUBCUTANEOUS | Status: AC
  Administered 2013-03-24: 225 mg via SUBCUTANEOUS

## 2013-04-02 ENCOUNTER — Emergency Department (HOSPITAL_COMMUNITY): Payer: Commercial Managed Care - PPO

## 2013-04-02 ENCOUNTER — Encounter (HOSPITAL_COMMUNITY): Payer: Self-pay | Admitting: Emergency Medicine

## 2013-04-02 ENCOUNTER — Emergency Department (HOSPITAL_COMMUNITY)
Admission: EM | Admit: 2013-04-02 | Discharge: 2013-04-02 | Disposition: A | Discharge status: Home / Self Care | Payer: Commercial Managed Care - PPO | Attending: Emergency Medicine | Admitting: Emergency Medicine

## 2013-04-02 DIAGNOSIS — Z87891 Personal history of nicotine dependence: Secondary | ICD-10-CM | POA: Insufficient documentation

## 2013-04-02 DIAGNOSIS — Z8619 Personal history of other infectious and parasitic diseases: Secondary | ICD-10-CM | POA: Insufficient documentation

## 2013-04-02 DIAGNOSIS — K219 Gastro-esophageal reflux disease without esophagitis: Secondary | ICD-10-CM | POA: Insufficient documentation

## 2013-04-02 DIAGNOSIS — J441 Chronic obstructive pulmonary disease with (acute) exacerbation: Secondary | ICD-10-CM | POA: Insufficient documentation

## 2013-04-02 DIAGNOSIS — R51 Headache: Secondary | ICD-10-CM | POA: Insufficient documentation

## 2013-04-02 DIAGNOSIS — R5381 Other malaise: Secondary | ICD-10-CM | POA: Insufficient documentation

## 2013-04-02 DIAGNOSIS — Z8659 Personal history of other mental and behavioral disorders: Secondary | ICD-10-CM | POA: Insufficient documentation

## 2013-04-02 DIAGNOSIS — Z872 Personal history of diseases of the skin and subcutaneous tissue: Secondary | ICD-10-CM | POA: Insufficient documentation

## 2013-04-02 DIAGNOSIS — I10 Essential (primary) hypertension: Secondary | ICD-10-CM

## 2013-04-02 DIAGNOSIS — J45901 Unspecified asthma with (acute) exacerbation: Secondary | ICD-10-CM

## 2013-04-02 DIAGNOSIS — Z8742 Personal history of other diseases of the female genital tract: Secondary | ICD-10-CM | POA: Insufficient documentation

## 2013-04-02 DIAGNOSIS — Z79899 Other long term (current) drug therapy: Secondary | ICD-10-CM | POA: Insufficient documentation

## 2013-04-02 LAB — CBC
HCT: 39.9 % (ref 36.0–46.0)
Hemoglobin: 13.1 g/dL (ref 12.0–15.0)
MCH: 27.6 pg (ref 26.0–34.0)
MCV: 84 fL (ref 78.0–100.0)
RBC: 4.75 MIL/uL (ref 3.87–5.11)
WBC: 14.1 10*3/uL — ABNORMAL HIGH (ref 4.0–10.5)

## 2013-04-02 LAB — BASIC METABOLIC PANEL
BUN: 8 mg/dL (ref 6–23)
CO2: 25 mEq/L (ref 19–32)
Chloride: 103 mEq/L (ref 96–112)
Glucose, Bld: 96 mg/dL (ref 70–99)
Potassium: 3.6 mEq/L (ref 3.5–5.1)
Sodium: 138 mEq/L (ref 135–145)

## 2013-04-02 MED ORDER — PREDNISONE 20 MG PO TABS
60.0000 mg | ORAL_TABLET | Freq: Once | ORAL | Status: AC
  Administered 2013-04-02: 60 mg via ORAL
  Filled 2013-04-02: qty 3

## 2013-04-02 MED ORDER — ALBUTEROL SULFATE (5 MG/ML) 0.5% IN NEBU
5.0000 mg | INHALATION_SOLUTION | Freq: Once | RESPIRATORY_TRACT | Status: AC
  Administered 2013-04-02: 5 mg via RESPIRATORY_TRACT

## 2013-04-02 MED ORDER — ALBUTEROL SULFATE (5 MG/ML) 0.5% IN NEBU
2.5000 mg | INHALATION_SOLUTION | Freq: Once | RESPIRATORY_TRACT | Status: DC
  Filled 2013-04-02: qty 0.5

## 2013-04-02 MED ORDER — PREDNISONE 10 MG PO TABS: 40.0000 mg | ORAL_TABLET | Freq: Every day | ORAL | Status: DC

## 2013-04-02 MED ORDER — IPRATROPIUM BROMIDE 0.02 % IN SOLN
0.5000 mg | Freq: Once | RESPIRATORY_TRACT | Status: AC
  Administered 2013-04-02: 0.5 mg via RESPIRATORY_TRACT
  Filled 2013-04-02: qty 2.5

## 2013-04-02 MED ORDER — ALBUTEROL SULFATE (5 MG/ML) 0.5% IN NEBU
INHALATION_SOLUTION | RESPIRATORY_TRACT | Status: AC
  Administered 2013-04-02: 2.5 mg
  Filled 2013-04-02: qty 1

## 2013-04-02 MED ORDER — METHYLPREDNISOLONE SODIUM SUCC 125 MG IJ SOLR
125.0000 mg | Freq: Once | INTRAMUSCULAR | Status: DC
  Filled 2013-04-02: qty 2

## 2013-04-02 MED ORDER — ALBUTEROL SULFATE HFA 108 (90 BASE) MCG/ACT IN AERS
2.0000 | INHALATION_SPRAY | Freq: Once | RESPIRATORY_TRACT | Status: AC
  Administered 2013-04-02: 2 via RESPIRATORY_TRACT
  Filled 2013-04-02: qty 6.7

## 2013-04-02 MED ORDER — ALBUTEROL (5 MG/ML) CONTINUOUS INHALATION SOLN
INHALATION_SOLUTION | RESPIRATORY_TRACT | Status: AC
  Filled 2013-04-02: qty 20

## 2013-04-02 MED ORDER — AZITHROMYCIN 250 MG PO TABS: ORAL_TABLET | ORAL | Status: DC

## 2013-04-02 NOTE — ED Notes (Signed)
Pt leaves ED dissatisfied. Pt states "we did not check her thoroughly." Pt left paperwork on stretcher.

## 2013-04-02 NOTE — ED Notes (Signed)
Respiratory notified for treatment.

## 2013-04-02 NOTE — ED Notes (Signed)
MD at bedside. Dr. Masneri. 

## 2013-04-02 NOTE — ED Notes (Signed)
Pt states her asthma has flared up the past few days and has been taking her inhalers "too much." She took her albuterol inhaler 3 times today and Dulera two times today. Pt states she is dizzy, lightheaded, and has a headache. Pt's BP 157/106.

## 2013-04-02 NOTE — ED Provider Notes (Addendum)
CSN: [id number]     Arrival date & time 04/02/13  1254 History   First MD Initiated Contact with Patient 04/02/13 1356     No chief complaint on file.  (Consider location/radiation/quality/duration/timing/severity/associated sxs/prior Treatment) HPI Comments: 42 yo AA female with h/o Asthma presents with cc of worsening asthma.  Pt states for past 3 days her symptoms have gotten worse.  She denies f/c.  Has needed albuterol inhaler 6 times in past 24 hours.  No recent steroids.  No h/o intubation.    Pt reports mild HA.  Not worst, not first, not thunderclap.  No neurologic symptoms.    Pt denies f/c, no recent infection.  No CP, ABD pain, n/v, f/u/d, hematuria. Pt denies Vaginal symptoms.    Pt denies pregnancy.  LMP [id number].    Patient is a 42 y.o. female presenting with shortness of breath. The history is provided by the patient.  Shortness of Breath Severity:  Mild Onset quality:  Gradual Duration:  3 days Timing:  Constant Progression:  Worsening Chronicity:  Recurrent Context: activity   Context: not animal exposure, not emotional upset, not fumes, not known allergens, not occupational exposure, not pollens, not smoke exposure, not strong odors, not URI and not weather changes   Context comment:  Pt has a history of asthma.  Symtpoms worse with exertion.   Relieved by:  Inhaler Worsened by:  Nothing tried Associated symptoms: cough, headaches and wheezing   Associated symptoms: no abdominal pain, no chest pain, no claudication, no diaphoresis, no ear pain, no fever, no hemoptysis, no neck pain, no PND, no rash, no sore throat, no sputum production, no syncope, no swollen glands and no vomiting   Associated symptoms comment:  No syncope.  Pt reports nonproductive cough.     Past Medical History  Diagnosis Date  . Acanthosis nigricans   . Menorrhagia   . Hypertension, essential   . Insomnia   . Morbid obesity   . Helicobacter pylori (H. pylori) infection   . Sleep apnea     Sleep study 2008 - mild OSA, not enough events to titrate CPAP  . COPD (chronic obstructive pulmonary disease)     PFTs in 2002, FEV1/FVC 65, no post bronchodilater test done  . Asthma     Followed by Dr. Maple Hudson (pulmonology); receives every other week omalizumab injections; has frequent exacerbations  . Depression   . GERD (gastroesophageal reflux disease)   . Tobacco user   . Obesity    Past Surgical History  Procedure Laterality Date  . Tubal ligation  1996    bilateral  . Breast reduction surgery  09/2011   Family History  Problem Relation Age of Onset  . Asthma Daughter   . Cancer Paternal Aunt   . Asthma Maternal Grandmother   . Hypertension Mother    History  Substance Use Topics  . Smoking status: Former Smoker -- 0.25 packs/day for 9 years    Types: Cigarettes    Quit date: 10/04/2012  . Smokeless tobacco: Never Used  . Alcohol Use: No   OB History   Grav Para Term Preterm Abortions TAB SAB Ect Mult Living                 Review of Systems  Constitutional: Positive for fatigue. Negative for fever, chills, diaphoresis and appetite change.  HENT: Negative for congestion, dental problem, ear pain and sore throat.   Eyes: Negative.   Respiratory: Positive for cough, shortness of breath and wheezing. Negative  for apnea, hemoptysis, sputum production, choking and chest tightness.   Cardiovascular: Negative for chest pain, claudication, syncope and PND.  Gastrointestinal: Negative for nausea, vomiting, abdominal pain and diarrhea.  Endocrine: Negative.   Genitourinary: Negative.   Musculoskeletal: Negative.  Negative for neck pain.  Skin: Negative for rash.  Allergic/Immunologic: Negative.   Neurological: Positive for headaches. Negative for seizures, syncope, speech difficulty, light-headedness and numbness.  Hematological: Negative.     Allergies  Ace inhibitors and Percocet  Home Medications   Current Outpatient Rx  Name  Route  Sig  Dispense  Refill  .  albuterol (PROVENTIL HFA;VENTOLIN HFA) 108 (90 BASE) MCG/ACT inhaler   Inhalation   Inhale 2 puffs into the lungs every 6 (six) hours as needed for wheezing.         . clonazePAM (KLONOPIN) 0.5 MG tablet   Oral   Take 1 mg by mouth at bedtime.         [name] ibuprofen (ADVIL) 200 MG tablet   Oral   Take 3 tablets (600 mg total) by mouth every 6 (six) hours as needed.   100 tablet   2   . mometasone-formoterol (DULERA) 100-5 MCG/ACT AERO   Inhalation   Inhale 2 puffs into the lungs 2 (two) times daily.         [name] omalizumab (XOLAIR) 150 MG injection   Subcutaneous   Inject 225 mg into the skin every 14 (fourteen) days.   4 each   5   . omeprazole (PRILOSEC) 20 MG capsule   Oral   Take 20 mg by mouth daily as needed (heartburn).          [name] oxyCODONE-acetaminophen (PERCOCET) 10-325 MG per tablet   Oral   Take 1 tablet by mouth 2 (two) times daily as needed for pain.   30 tablet   0   . azithromycin (ZITHROMAX Z-PAK) 250 MG tablet      2 tabs PO on day # 1, then 1 tab PO qd for 4 days   6 each   0   . predniSONE (DELTASONE) 10 MG tablet   Oral   Take 4 tablets (40 mg total) by mouth daily.   16 tablet   0     First dose Sat 04/03/13    BP 170/118  Pulse 87  Temp(Src) 98.4 F (36.9 C) (Oral)  Resp 22  Ht 5\' 6"  (1.676 m)  Wt 361 lb 8 oz (163.975 kg)  BMI 58.38 kg/m2  SpO2 99%  LMP 03/01/2013 Physical Exam  Vitals reviewed. Constitutional: She is oriented to person, place, and time. She appears well-developed and well-nourished.  HENT:  Head: Normocephalic and atraumatic.  Eyes: Conjunctivae are normal. Right eye exhibits no discharge. Left eye exhibits no discharge.  Neck: Normal range of motion. Neck supple. No JVD present.  Cardiovascular: Normal rate and regular rhythm.   Pulmonary/Chest: Effort normal. No stridor. No respiratory distress. She has wheezes. She has no rales. She exhibits no tenderness.  No increased WOB, no accessory muscle use,  minimal diffuse wheeze on auscultation  Abdominal: Soft. Bowel sounds are normal. There is no tenderness. There is no rebound and no guarding.  Protuberent abdomen. Soft, nt, nd, no grm, no hsm, no ttp at [place], neg [place]  Musculoskeletal: Normal range of motion. She exhibits no edema and no tenderness.  Neurological: She is alert and oriented to person, place, and time. She has normal reflexes.    ED Course  Procedures (including critical  care time) Labs Review Labs Reviewed  CBC - Abnormal; Notable for the following:    WBC 14.1 (*)    RDW 15.8 (*)    All other components within normal limits  BASIC METABOLIC PANEL  URINALYSIS, DIPSTICK ONLY  PREGNANCY, URINE   Imaging Review Dg Chest 2 View (if Patient Has Fever And/or Copd)  04/02/2013   CLINICAL DATA:  Asthma.  Shortness of breath.  EXAM: CHEST  2 VIEW  COMPARISON:  02/15/2013.  FINDINGS: The heart size and mediastinal contours are within normal limits. Both lungs are clear. The visualized skeletal structures are unremarkable.  IMPRESSION: No active cardiopulmonary disease.   Electronically Signed   By: Maisie Fus  Register   On: 04/02/2013 13:51    EKG Interpretation   None       MDM   1. Asthma attack   2. Hypertension    42 yo AA female with cc of wheeze x 3 days.  At home albuterol use increased.  No recent steroids.  No recent f/c or infection.  CXR normal from triage.  Pt also has mild HA.  (Not worst, not first, no thunderclap).  Plan for IV hydration, UA/hcg, RT treatment, solumedrol IV and obs.  Doubt PE, ACS, CHF.  No evidence of respiratory distress.    After RT treatment: 4:39 PM Filed Vitals:   04/02/13 1608  BP: 170/118  Pulse: 87  Temp: 98.4 F (36.9 C)  Resp: 22  Pulse Ox 100% on RA  Pt ambulated to and from bathroom without any increased work of breathing or noted SOB.  POx 100% on RA after ambulation.  I initially wrote for IV Solu-Medrol and IV fluids however IV access was difficult and  patient opted for oral medications and can hydrate orally. Additionally, I ordered a urinalysis and urine hCG however the patient dropped her urine specimen in the commode after collecting it. She denies pregnancy and also urinary symptoms, frequency urgency dysuria, vaginal symptoms, or abdominal pain.   Plan to discharge patient home with prednisone and Z-Pak. Suspect asthma exacerbation however patient may have COPD component and will treat with antibiotics. I suspect blood pressure is elevated based on her associated complaints and she has a diagnosis of essential HTN.  Pt should follow up with her PCP for further evaluation of BP (5 day BP check)   Pt agrees with plan for antibiotics and steroids and will continue her at-home inhaler (replaced today in ER). ER precautions were given for worsening shortness of breath, fever, chest pain, other concerns.  I ordered an inhaler for the patient to take home with her.  Pt does follow with Pulmonology and receives every other week omalizumab injections.   4:44 PM  Per nursing, patient left emergency department upset. She left the emergency department without her prescriptions for Prednisone and Azithromycin. She did take the albuterol inhaler that she was given. The patient asked for pain medications prior to discharge, however she currently has an Rx for Percocet (based on records) and I did write for prednisone which I felt could help her symptoms.  She did tell nursing that she would follow up as an outpatient.  She ambulated out of ER with a normal gait and in NAD per nursing.    Darlys Gales, MD 04/02/13 1644  Darlys Gales, MD 04/03/13 [phone]

## 2013-04-05 ENCOUNTER — Ambulatory Visit: Payer: Commercial Managed Care - PPO | Admitting: Internal Medicine

## 2013-04-06 ENCOUNTER — Ambulatory Visit: Payer: Commercial Managed Care - PPO

## 2013-04-08 ENCOUNTER — Ambulatory Visit (INDEPENDENT_AMBULATORY_CARE_PROVIDER_SITE_OTHER): Payer: Commercial Managed Care - PPO | Admitting: Internal Medicine

## 2013-04-08 ENCOUNTER — Ambulatory Visit (INDEPENDENT_AMBULATORY_CARE_PROVIDER_SITE_OTHER): Payer: Commercial Managed Care - PPO

## 2013-04-08 ENCOUNTER — Encounter: Payer: Self-pay | Admitting: Internal Medicine

## 2013-04-08 VITALS — BP 122/78 | HR 86 | Ht 66.0 in | Wt 371.0 lb

## 2013-04-08 DIAGNOSIS — J45909 Unspecified asthma, uncomplicated: Secondary | ICD-10-CM

## 2013-04-08 DIAGNOSIS — G4733 Obstructive sleep apnea (adult) (pediatric): Secondary | ICD-10-CM

## 2013-04-08 DIAGNOSIS — J441 Chronic obstructive pulmonary disease with (acute) exacerbation: Secondary | ICD-10-CM

## 2013-04-08 DIAGNOSIS — Z6841 Body Mass Index (BMI) 40.0 and over, adult: Secondary | ICD-10-CM

## 2013-04-08 DIAGNOSIS — J309 Allergic rhinitis, unspecified: Secondary | ICD-10-CM

## 2013-04-08 MED ORDER — METHYLPREDNISOLONE ACETATE 80 MG/ML IJ SUSP
80.0000 mg | Freq: Once | INTRAMUSCULAR | Status: AC
  Administered 2013-04-08: 80 mg via INTRAMUSCULAR

## 2013-04-08 NOTE — Progress Notes (Signed)
Patient ID: April [name], female    DOB: [birth year], 42 y.o.   MRN: [id number]  HPI 66 yoF former smoker followed for chronic severe asthma. Hospitalized 4/11-12/12 for another exacerbation she says was triggered by catching a cold. Common triggers include colds and any smoke exposure. Back now to baseline. She has continued Xolair injection here every 2 weeks and does feel it has reduced frequency of flare-ups. Usual weight range 320-340 and she asks why weight is up to 360. Discussed steroids, including IV therapy at hospital but also ?role of Dulera 200-5. She is now being seen a [facility] anticipating breast reduction surgery and we talked about bariatric surgery. Takes omeprazole twice daily before meals, but has frequent heart burn.   04/12/11- 47 yoF former smoker followed for chronic severe asthma. Has had flu vaccine and pertussis vaccine. Says her breathing is okay. We had sent antibiotic for a cold with bronchitis around Thanksgiving. She denies any smoking at all now. She has avoided hospital partly because she has been better and partly because she is unwilling to go. Xolair has made a real improvement. She had breast reduction surgery without problems at [facility] and has considered bariatric surgery but can't afford it. Uses her nebulizer machine but not every day. Does continue Dulera and omeprazole. She is taking Ambien but still sleeps poorly at night. She no longer misses much work but that means she can't nap in the daytime. Complains of frequently waking at night but does not know if she snores.  04/25/11-  67 yoF former smoker followed for chronic severe asthma complicated by morbid obesity, HBP, depression. Her antidepressant was changed to Wellbutrin on December 18. Since then she can't sleep, using 3 Ambien per night. Increased asthma with wheezing. She increased to [facility] to 6 puffs daily. Had a cold at Thanksgiving but not since. She continues Xolair.  08/06/11- 24 yoF former  smoker followed for chronic severe asthma complicated by morbid obesity, HBP, depression, GERD. Change jobs and insurance. Had a lapse of 2 weeks in her Xolair therapy but ready to restart. Work environment is clear and cool, indoors. Watery rhinorrhea not helped by 3 intermittent use of Flonase. No acute asthma attack but uses her Dulera and rescue inhaler every night at bedtime. Ambien helps sleep.  10/02/12- 26 yoF former smoker followed for chronic severe asthma complicated by morbid obesity, HBP, depression, GERD. FOLLOWS FOR: had a few hosptial visits recently due to insurance and PA for [facility] to have medication on Tuesday 10-06-12 and can resume Xolair then. Continues to have trouble sleeping (falling and staying) and would like to go back on Ambien if possible. Throat itch x several days, dry cough. ER again 2 weeks ago. Off Xolair x 5 weeks but has insurance ok now to restart. She started some left over prednisone. Aware occasional reflux- discussed precautions. CXR 05/22/12 IMPRESSION:  No acute infiltrate or pulmonary edema. Mild perihilar  increased bronchial markings without focal consolidation.  Original Report Authenticated By: Lahoma Crocker, M.D.   11/09/2012 [facility] follow up  Recent admitted for Acute asthma flare. Tx w/ abx , steroids and nebs. Reports doing well since discharge.  finished prednisone and abx from hosp but currently on Cipro for UTI.  no new complaints; reports breathing is back to baseline.   Had restarted smoking but has quit . We discussed med compliance. No fever, chest pain, orthopnea, hemoptysis.  No edema.  11/09/2012 CXR Stable, with suspected artifactual density in the medial  right  upper lung related to summation. No acute cardiopulmonary  Abnormality.  01/15/13- 42 yoF former smoker followed for chronic severe asthma complicated by morbid obesity, HBP, depression, GERD. FOLLOWS FOR: patient is not working but has insurance-medications costly and  unable to afford at times. Had been getting Xolair shots regularly, but has had to skip while insurance is worked out. Has had flu vaccine. New problem-insomnia. Difficulty initiating and maintaining sleep not adequately helped by clonazepam. Irregular bedtime. Gets up between 4 and 5 AM. Previously used Ambien but blames it for hallucination and was using too much. Sleep study ordered 2012 but she cannot get it done CXR 12/22/12 IMPRESSION:  No acute cardiopulmonary disease. Specifically, no evidence of  pneumonia.  Original Report Authenticated By: Tacey Ruiz, MD  04/08/13- 27 yoF former smoker followed for chronic severe asthma/ Xolair complicated by OSA/ CPAP,  morbid obesity, HBP, depression, GERD FOLLOWS FOR: still on Xolair; continues to wheeze alot. Feels like since taking clonazepam she wakes up with the feeling of not being able to breath. She describes waking wheezing and short of breath after taking clonazepam. It sounds as if she just sleeps deeply. Ambien caused hallucination when she was taking 2 or 3 at a time. She says she was "given" her CPAP machine at the hospital but with no followup instructions and no explanation as to how to use it.  NPSG 03/31/07- AHI 5.8/ hr, weight 330 lbs. She canceled planned more recent study.  Review of Systems-See HPI Constitutional:   No-   weight loss, night sweats, fevers, chills, +fatigue, lassitude. HEENT:   No-  headaches, difficulty swallowing, tooth/dental problems, sore throat,       + sneezing, itching, ear ache, nasal congestion, post nasal drip,  CV:  No-   chest pain, orthopnea, PND, swelling in lower extremities, anasarca, dizziness, palpitations Resp: +shortness of breath with exertion or at rest.              No-   productive cough,  + non-productive cough,  No- coughing up of blood.              No-   change in color of mucus. wheezing.   Skin: No-   rash or lesions. GI:  No-   heartburn, indigestion, abdominal pain, nausea,  vomiting,  GU: MS:  No-   joint pain or swelling.   Neuro-     nothing unusual Psych:  No- change in mood or affect. No depression or anxiety.  No memory loss.    Objective:   Physical Exam General- Alert, Oriented, Affect-appropriate, Distress- none acute, + morbid obesity,  Skin- rash-none, lesions- none, excoriation- none Lymphadenopathy- none Head- atraumatic            Eyes- Gross vision intact, PERRLA, conjunctivae clear secretions            Ears- Hearing, canals-normal            Nose- Clear, no-Septal dev, mucus, polyps, erosion, perforation             Throat- Mallampati II , mucosa clear , drainage- none, tonsils- atrophic Neck- flexible , trachea midline, no stridor , thyroid nl, carotid no bruit Chest - symmetrical excursion , unlabored           Heart/CV- RRR , no murmur , no gallop  , no rub, nl s1 s2                           -  JVD- none , edema- none, stasis changes- none, varices- none           Lung- Diminished, unlabored.  Wheeze+ mild,no cough, dullness-none, rub- none           Chest wall-  Abd-  Br/ Gen/ Rectal- Not done, not indicated Extrem- cyanosis- none, clubbing, none, atrophy- none, strength- nl Neuro- grossly intact to observation

## 2013-04-08 NOTE — Patient Instructions (Signed)
Order- [facility]- DME  For OSA  Pt says she was "given CPAP by hospital" ? Advanced Need to find the NPSG, Get DME to teach CPAP and download for auto for pressure recommendation, mask of choice, humidifier, supplies        Dx OSA  Depo 80

## 2013-04-09 MED ORDER — OMALIZUMAB 150 MG ~~LOC~~ SOLR
225.0000 mg | Freq: Once | SUBCUTANEOUS | Status: AC
  Administered 2013-04-09: 225 mg via SUBCUTANEOUS

## 2013-04-13 ENCOUNTER — Ambulatory Visit: Payer: Commercial Managed Care - PPO | Admitting: Family Medicine

## 2013-04-15 ENCOUNTER — Emergency Department (HOSPITAL_COMMUNITY)
Admission: EM | Admit: 2013-04-15 | Discharge: 2013-04-15 | Disposition: A | Discharge status: Home / Self Care | Payer: Commercial Managed Care - PPO | Attending: Emergency Medicine | Admitting: Emergency Medicine

## 2013-04-15 ENCOUNTER — Encounter (HOSPITAL_COMMUNITY): Payer: Self-pay | Admitting: Emergency Medicine

## 2013-04-15 ENCOUNTER — Emergency Department (HOSPITAL_COMMUNITY): Payer: Commercial Managed Care - PPO

## 2013-04-15 DIAGNOSIS — I10 Essential (primary) hypertension: Secondary | ICD-10-CM | POA: Insufficient documentation

## 2013-04-15 DIAGNOSIS — Z8619 Personal history of other infectious and parasitic diseases: Secondary | ICD-10-CM | POA: Insufficient documentation

## 2013-04-15 DIAGNOSIS — K219 Gastro-esophageal reflux disease without esophagitis: Secondary | ICD-10-CM | POA: Insufficient documentation

## 2013-04-15 DIAGNOSIS — Z87891 Personal history of nicotine dependence: Secondary | ICD-10-CM | POA: Insufficient documentation

## 2013-04-15 DIAGNOSIS — F3289 Other specified depressive episodes: Secondary | ICD-10-CM | POA: Insufficient documentation

## 2013-04-15 DIAGNOSIS — G4700 Insomnia, unspecified: Secondary | ICD-10-CM | POA: Insufficient documentation

## 2013-04-15 DIAGNOSIS — J441 Chronic obstructive pulmonary disease with (acute) exacerbation: Secondary | ICD-10-CM | POA: Insufficient documentation

## 2013-04-15 DIAGNOSIS — Z79899 Other long term (current) drug therapy: Secondary | ICD-10-CM | POA: Insufficient documentation

## 2013-04-15 DIAGNOSIS — IMO0002 Reserved for concepts with insufficient information to code with codable children: Secondary | ICD-10-CM | POA: Insufficient documentation

## 2013-04-15 DIAGNOSIS — G4730 Sleep apnea, unspecified: Secondary | ICD-10-CM | POA: Insufficient documentation

## 2013-04-15 DIAGNOSIS — J45909 Unspecified asthma, uncomplicated: Secondary | ICD-10-CM

## 2013-04-15 DIAGNOSIS — Z8742 Personal history of other diseases of the female genital tract: Secondary | ICD-10-CM | POA: Insufficient documentation

## 2013-04-15 DIAGNOSIS — F329 Major depressive disorder, single episode, unspecified: Secondary | ICD-10-CM | POA: Insufficient documentation

## 2013-04-15 DIAGNOSIS — Z872 Personal history of diseases of the skin and subcutaneous tissue: Secondary | ICD-10-CM | POA: Insufficient documentation

## 2013-04-15 DIAGNOSIS — J40 Bronchitis, not specified as acute or chronic: Secondary | ICD-10-CM

## 2013-04-15 MED ORDER — PREDNISONE 20 MG PO TABS: 60.0000 mg | ORAL_TABLET | Freq: Every day | ORAL | Status: DC

## 2013-04-15 MED ORDER — IPRATROPIUM BROMIDE 0.02 % IN SOLN
0.5000 mg | Freq: Once | RESPIRATORY_TRACT | Status: AC
  Administered 2013-04-15: 0.5 mg via RESPIRATORY_TRACT
  Filled 2013-04-15: qty 2.5

## 2013-04-15 MED ORDER — ALBUTEROL SULFATE (5 MG/ML) 0.5% IN NEBU
5.0000 mg | INHALATION_SOLUTION | Freq: Once | RESPIRATORY_TRACT | Status: AC
  Administered 2013-04-15: 5 mg via RESPIRATORY_TRACT
  Filled 2013-04-15: qty 1

## 2013-04-15 MED ORDER — ALBUTEROL SULFATE HFA 108 (90 BASE) MCG/ACT IN AERS: 2.0000 | INHALATION_SPRAY | RESPIRATORY_TRACT | Status: DC | PRN

## 2013-04-15 MED ORDER — AZITHROMYCIN 250 MG PO TABS: 250.0000 mg | ORAL_TABLET | Freq: Every day | ORAL | Status: DC

## 2013-04-15 MED ORDER — ALBUTEROL SULFATE HFA 108 (90 BASE) MCG/ACT IN AERS
2.0000 | INHALATION_SPRAY | RESPIRATORY_TRACT | Status: DC | PRN
  Administered 2013-04-15: 2 via RESPIRATORY_TRACT
  Filled 2013-04-15: qty 6.7

## 2013-04-15 MED ORDER — PREDNISONE 20 MG PO TABS
60.0000 mg | ORAL_TABLET | Freq: Once | ORAL | Status: AC
  Administered 2013-04-15: 60 mg via ORAL
  Filled 2013-04-15: qty 3

## 2013-04-15 NOTE — ED Notes (Signed)
PT states her asthma has been flaring up for past 4 days and home medications and nebulizers are not helping. Pt able to speak in complete sentences. Gets sob with activity, at nights and in the morning.

## 2013-04-15 NOTE — ED Provider Notes (Signed)
CSN: [id number]     Arrival date & time 04/15/13  1250 History   First MD Initiated Contact with Patient 04/15/13 1259     Chief Complaint  Patient presents with  . Asthma  . Shortness of Breath   (Consider location/radiation/quality/duration/timing/severity/associated sxs/prior Treatment) HPI Comments: Has been complaining of increased cough and shortness of breath for the last 5 days. Patient has a history of asthma. She has been using her bronchodilators without any improvement. Patient's cough is mostly nonproductive. No fever. She has not had any vomiting or diarrhea associated with the symptoms.  Patient is a 42 y.o. female presenting with asthma and shortness of breath.  Asthma Associated symptoms include shortness of breath.  Shortness of Breath Associated symptoms: cough and wheezing   Associated symptoms: no fever     Past Medical History  Diagnosis Date  . Acanthosis nigricans   . Menorrhagia   . Hypertension, essential   . Insomnia   . Morbid obesity   . Helicobacter pylori (H. pylori) infection   . Sleep apnea     Sleep study 2008 - mild OSA, not enough events to titrate CPAP  . COPD (chronic obstructive pulmonary disease)     PFTs in 2002, FEV1/FVC 65, no post bronchodilater test done  . Asthma     Followed by Dr. Maple Hudson (pulmonology); receives every other week omalizumab injections; has frequent exacerbations  . Depression   . GERD (gastroesophageal reflux disease)   . Tobacco user   . Obesity    Past Surgical History  Procedure Laterality Date  . Tubal ligation  1996    bilateral  . Breast reduction surgery  09/2011   Family History  Problem Relation Age of Onset  . Asthma Daughter   . Cancer Paternal Aunt   . Asthma Maternal Grandmother   . Hypertension Mother    History  Substance Use Topics  . Smoking status: Former Smoker -- 0.25 packs/day for 9 years    Types: Cigarettes    Quit date: 10/04/2012  . Smokeless tobacco: Never Used  . Alcohol  Use: No   OB History   Grav Para Term Preterm Abortions TAB SAB Ect Mult Living                 Review of Systems  Constitutional: Negative for fever.  Respiratory: Positive for cough, shortness of breath and wheezing.   All other systems reviewed and are negative.    Allergies  Ace inhibitors and Percocet  Home Medications   Current Outpatient Rx  Name  Route  Sig  Dispense  Refill  . albuterol (PROVENTIL HFA;VENTOLIN HFA) 108 (90 BASE) MCG/ACT inhaler   Inhalation   Inhale 2 puffs into the lungs every 6 (six) hours as needed for wheezing.         . clonazePAM (KLONOPIN) 0.5 MG tablet   Oral   Take 1 mg by mouth at bedtime.         [name] ibuprofen (ADVIL) 200 MG tablet   Oral   Take 3 tablets (600 mg total) by mouth every 6 (six) hours as needed.   100 tablet   2   . mometasone-formoterol (DULERA) 100-5 MCG/ACT AERO   Inhalation   Inhale 2 puffs into the lungs 2 (two) times daily.         [name] omalizumab (XOLAIR) 150 MG injection   Subcutaneous   Inject 225 mg into the skin every 14 (fourteen) days.   4 each   5   .  omeprazole (PRILOSEC) 20 MG capsule   Oral   Take 20 mg by mouth daily as needed (heartburn).          [name] oxyCODONE-acetaminophen (PERCOCET) 10-325 MG per tablet   Oral   Take 1 tablet by mouth 2 (two) times daily as needed for pain.   30 tablet   0    BP 166/93  Pulse 79  Temp(Src) 97.8 F (36.6 C) (Oral)  Resp 20  SpO2 100% Physical Exam  Constitutional: She is oriented to person, place, and time. She appears well-developed and well-nourished. No distress.  HENT:  Head: Normocephalic and atraumatic.  Right Ear: Hearing normal.  Left Ear: Hearing normal.  Nose: Nose normal.  Mouth/Throat: Oropharynx is clear and moist and mucous membranes are normal.  Eyes: Conjunctivae and EOM are normal. Pupils are equal, round, and reactive to light.  Neck: Normal range of motion. Neck supple.  Cardiovascular: Regular rhythm, S1 normal and S2  normal.  Exam reveals no gallop and no friction rub.   No murmur heard. Pulmonary/Chest: Effort normal. No respiratory distress. She has decreased breath sounds. She has wheezes. She exhibits no tenderness.  Abdominal: Soft. Normal appearance and bowel sounds are normal. There is no hepatosplenomegaly. There is no tenderness. There is no rebound, no guarding, no tenderness at McBurney's point and negative Murphy's sign. No hernia.  Musculoskeletal: Normal range of motion.  Neurological: She is alert and oriented to person, place, and time. She has normal strength. No cranial nerve deficit or sensory deficit. Coordination normal. GCS eye subscore is 4. GCS verbal subscore is 5. GCS motor subscore is 6.  Skin: Skin is warm, dry and intact. No rash noted. No cyanosis.  Psychiatric: She has a normal mood and affect. Her speech is normal and behavior is normal. Thought content normal.    ED Course  Procedures (including critical care time) Labs Review Labs Reviewed - No data to display Imaging Review Dg Chest 2 View  04/15/2013   CLINICAL DATA:  Cough.  Shortness of breath.  Asthma.  EXAM: CHEST  2 VIEW  COMPARISON:  04/02/2013  FINDINGS: The heart size and mediastinal contours are within normal limits. Both lungs are clear. The visualized skeletal structures are unremarkable.  IMPRESSION: No active cardiopulmonary disease.   Electronically Signed   By: Myles Rosenthal M.D.   On: 04/15/2013 14:42    EKG Interpretation   None       MDM  Diagnosis: 1. Asthma 2. Bronchitis  Patient presents to the ER for evaluation of shortness of breath secondary to her asthma. She had not responded to albuterol at home. Patient given albuterol and Atrovent nebulizers here in the ER with improvement. She was also administered prednisone. Chest x-ray is clear. Oxygenation is normal. She had significantly decreased air movement initially, air movement is much improved and she symptomatically feels better. Will be  discharged, continue bronchodilators. Patient prescribed prednisone and Zithromax.    Gilda Crease, MD 04/15/13 [phone]

## 2013-04-23 ENCOUNTER — Ambulatory Visit: Payer: Commercial Managed Care - PPO

## 2013-04-25 ENCOUNTER — Emergency Department (HOSPITAL_COMMUNITY)
Admission: EM | Admit: 2013-04-25 | Discharge: 2013-04-25 | Discharge status: Against Medical Advice | Payer: Commercial Managed Care - PPO | Attending: Emergency Medicine | Admitting: Emergency Medicine

## 2013-04-25 ENCOUNTER — Encounter (HOSPITAL_COMMUNITY): Payer: Self-pay | Admitting: Emergency Medicine

## 2013-04-25 DIAGNOSIS — I10 Essential (primary) hypertension: Secondary | ICD-10-CM | POA: Insufficient documentation

## 2013-04-25 DIAGNOSIS — J441 Chronic obstructive pulmonary disease with (acute) exacerbation: Secondary | ICD-10-CM | POA: Insufficient documentation

## 2013-04-25 MED ORDER — ALBUTEROL SULFATE (5 MG/ML) 0.5% IN NEBU
5.0000 mg | INHALATION_SOLUTION | Freq: Once | RESPIRATORY_TRACT | Status: AC
  Administered 2013-04-25: 5 mg via RESPIRATORY_TRACT
  Filled 2013-04-25: qty 1

## 2013-04-25 MED ORDER — IPRATROPIUM BROMIDE 0.02 % IN SOLN
0.5000 mg | Freq: Once | RESPIRATORY_TRACT | Status: AC
  Administered 2013-04-25: 0.5 mg via RESPIRATORY_TRACT
  Filled 2013-04-25: qty 2.5

## 2013-04-25 NOTE — ED Notes (Signed)
Pt called x2 for room, no answer.  

## 2013-04-25 NOTE — ED Notes (Signed)
No answer x2 

## 2013-04-25 NOTE — ED Notes (Signed)
Called for room x 1.  

## 2013-04-25 NOTE — ED Notes (Signed)
Pt c/o increased asthma with SOB x 1 week

## 2013-04-26 ENCOUNTER — Ambulatory Visit (INDEPENDENT_AMBULATORY_CARE_PROVIDER_SITE_OTHER): Payer: Commercial Managed Care - PPO

## 2013-04-26 ENCOUNTER — Telehealth: Payer: Self-pay | Admitting: Internal Medicine

## 2013-04-26 DIAGNOSIS — G4733 Obstructive sleep apnea (adult) (pediatric): Secondary | ICD-10-CM

## 2013-04-26 DIAGNOSIS — J45909 Unspecified asthma, uncomplicated: Secondary | ICD-10-CM

## 2013-04-26 NOTE — Telephone Encounter (Signed)
I spoke with pt. She reports she was giving CPAP machine by the hospital. She is wanting to know if we can send an order to [facility] to get her new machine. Please advise thanks

## 2013-04-26 NOTE — Telephone Encounter (Signed)
Looks as though a referral was placed to [facility] on 04-08-13; I have left a message with Efraim Kaufmann at [facility] [phone] to find out what is going on and how to get pt her CPAP machine.  Melissa to ask for me.

## 2013-04-26 NOTE — Telephone Encounter (Signed)
Spoke with Melissa; order that they got was for supplies for patient. I have spoken with CY about this and he gave okay to place order to [facility] to send to [facility].    Pt aware of order placed.

## 2013-04-26 NOTE — Telephone Encounter (Signed)
We had addressed this request at last ov, 04/08/13. She had a sleep study 2008-

## 2013-04-27 ENCOUNTER — Ambulatory Visit (INDEPENDENT_AMBULATORY_CARE_PROVIDER_SITE_OTHER): Payer: Commercial Managed Care - PPO | Admitting: Internal Medicine

## 2013-04-27 ENCOUNTER — Encounter: Payer: Self-pay | Admitting: Internal Medicine

## 2013-04-27 VITALS — BP 146/86 | HR 90 | Temp 97.0°F | Ht 67.0 in | Wt 371.4 lb

## 2013-04-27 DIAGNOSIS — Z6841 Body Mass Index (BMI) 40.0 and over, adult: Secondary | ICD-10-CM

## 2013-04-27 DIAGNOSIS — M5090 Cervical disc disorder, unspecified, unspecified cervical region: Secondary | ICD-10-CM

## 2013-04-27 DIAGNOSIS — J45901 Unspecified asthma with (acute) exacerbation: Secondary | ICD-10-CM

## 2013-04-27 MED ORDER — ALBUTEROL SULFATE (2.5 MG/3ML) 0.083% IN NEBU
2.5000 mg | INHALATION_SOLUTION | Freq: Once | RESPIRATORY_TRACT | Status: AC
  Administered 2013-04-27: 2.5 mg via RESPIRATORY_TRACT

## 2013-04-27 MED ORDER — IPRATROPIUM BROMIDE 0.02 % IN SOLN
0.5000 mg | Freq: Once | RESPIRATORY_TRACT | Status: AC
  Administered 2013-04-27: 0.5 mg via RESPIRATORY_TRACT

## 2013-04-27 MED ORDER — SODIUM CHLORIDE 0.9 % IV SOLN: 125.0000 mg | Freq: Once | INTRAVENOUS | Status: DC

## 2013-04-27 MED ORDER — IPRATROPIUM-ALBUTEROL 0.5-2.5 (3) MG/3ML IN SOLN: 3.0000 mL | RESPIRATORY_TRACT | Status: DC

## 2013-04-27 MED ORDER — OXYCODONE-ACETAMINOPHEN 10-325 MG PO TABS: 1.0000 | ORAL_TABLET | Freq: Four times a day (QID) | ORAL | Status: DC | PRN

## 2013-04-27 MED ORDER — PREDNISONE 20 MG PO TABS: 60.0000 mg | ORAL_TABLET | Freq: Every day | ORAL | Status: DC

## 2013-04-27 MED ORDER — METHYLPREDNISOLONE SODIUM SUCC 125 MG IJ SOLR
125.0000 mg | Freq: Once | INTRAMUSCULAR | Status: AC
  Administered 2013-04-27: 125 mg via INTRAMUSCULAR

## 2013-04-27 MED ORDER — OMALIZUMAB 150 MG ~~LOC~~ SOLR
225.0000 mg | Freq: Once | SUBCUTANEOUS | Status: AC
  Administered 2013-04-27: 225 mg via SUBCUTANEOUS

## 2013-04-27 NOTE — Progress Notes (Signed)
Subjective:    Patient ID: April [name], female    DOB: 1970/05/22, 42 y.o.   MRN: [id number]  HPI  Hx significant for severe asthma with multiple ED visits and hospital admission this year. She is on Xolair injections, albuterol and Dulera inhalers followed by Dr. Maple Hudson of Pulmonology. She is morbidly obese with BMI >50, OSA on CPAP, GERD and hypertension. She presents for post ED visit 04/15/2013 and 04/25/2013.   On 12/18 she was treated with prednisone and nebulized ATrovent and albuterol with improvement in her symptoms and subsequently discharged with prednisone and Zithromax. She again presented with c/o shortness of breath on 12/28 but left before evaluation. She had Xolair injection yesterday 12/29 at Pulmonologist office but failed to tell them she had increased usage of her inhalers and nebulizers to every 2-3 hours because of shortness of breath.  She states that she did tell them that she "was sick". She reports that she did complete the antibiotic course given in the ED but did not take the prednisone initially.  She was to start taking the prednisone 11 days ago with the regimen of 60 mg x 4 days  but states that she didn't start taking the prednisone until yesterday because "it doesn't do anything but make me want to eat". She took 40 mg yesterday and 60 mg today. Denies fever, nausea, vomiting, chest pain. States that she thinks her symptoms started with a cold because she had a sore throat a couple of weeks ago.She reports that she has been taking Ibuprofen and Tylenol for back pain. 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  She is speaking in full sentences with evidence of stridor or increased work of breathing. She states that she does not check her peak flows at home.  Prior documentation indicates that her baseline peak flow ~ 350. She is oxygenating well on room air at 100%.   Review of Systems    Constitutional: Negative.   HENT: Negative.   Eyes: Negative.   Respiratory: Positive for shortness of breath and wheezing.   Cardiovascular: Negative.   Gastrointestinal: Negative.   Endocrine: Negative.   Genitourinary: Negative.   Musculoskeletal: Negative.   Skin: Negative.   Allergic/Immunologic: Negative.   Neurological: Negative.   Hematological: Negative.   Psychiatric/Behavioral: Negative.        Objective:   Physical Exam  Constitutional: She is oriented to person, place, and time. She appears well-developed and well-nourished.  morbidly obese  HENT:  Head: Normocephalic and atraumatic.  Eyes: Conjunctivae are normal. Pupils are equal, round, and reactive to light.  Neck: Neck supple.  Cardiovascular: Normal rate and normal heart sounds.   Pulmonary/Chest: Effort normal. No accessory muscle usage. Not tachypneic. No respiratory distress. She has decreased breath sounds in the right lower field and the left lower field. She has wheezes in the right upper field, the right middle field, the left upper field and the left middle field. She has no rales. She exhibits no tenderness.  Abdominal: Soft. Bowel sounds are normal.  obese  Neurological: She is alert and oriented to person, place, and time.  Skin: Skin is warm and dry.  Psychiatric: She has a normal mood and affect.          Assessment & Plan:  See separate detailed problem-list charting:  #1 Asthma with exacerbation: multifactorial in setting of reported recent URI, work environment,  cold weather, and recent use of NSAIDS for back pain. -Duonebs in clinic today -IV Solu-Medrol today x1 -cont prednisone course -contact Pulmonology for sooner appt--> 05/07/2012 -advise to stop NSAIDs  Addendum: wheezing cleared after Solu-Medrol and 2nd Duoneb, pt to cont prednisone until Pulm appt  #2 Cervical Back pain secondary to cervical spondylosis with disc and facet disease C4-5, C5-6, requesting Percocet as this  helped with her foot pain previously.  Noted intolerance in Epic of itching but pt states this was not a problem recently -refill Percocet for short course until further evaluation by PCP

## 2013-04-27 NOTE — Patient Instructions (Signed)
We have treated you with IV steroids and more nebulized lung medicines. Resume taking the prednisone by mouth tomorrow (60 mg) and continue to take this until you see Dr. Maple Hudson in a little over a week. We discussed symptoms for re-evaluation in the Emergency Room. Take the pain medicine only when needed and as directed. Follow-up with your Primary Doctor at there first available appointment in Jan or Feb.

## 2013-04-27 NOTE — Assessment & Plan Note (Addendum)
Followed by Dr. Jetty Duhamel of Pulmonology. Presented to ED twice this month but only stayed for 1 evaluation.  Improved with Nebs and prednisone.  Did not continue prednisone course initially but states that she resumed 2 days ago.  Xolair injection yesterday but did not inform pulmonologist that she had increased usage of inhalers and nebs to q2-3 hours nor that she was in the ED. Peak flows today pre- 210/260/250 post- 240/270/230. Does not check peak flows at home.  Plan: -Duoneb in clinic -IV Solu-Medrol 125 mg x1 -prednisone 60 mg qd course -f/u Pulmonology 05/07/2012  Addendum:  Clinically improved after Solu-Medrol and Duoneb x2 with Peak flows in 300's

## 2013-04-27 NOTE — Assessment & Plan Note (Signed)
Recommend avoinding NSAIDS given frequent asthma exacerbations.  Counseled against OTC agents ie ibuprofen and Aleve, as well. -will refill short course of Percocet as pt states Tylenol and Tramadol ineffective which prompted her to try OTC meds. Percocet 10-325 refilled at #45 1 tab q6h prn neck pain

## 2013-04-29 NOTE — Assessment & Plan Note (Signed)
In 2008 her study documented mild sleep apnea. She canceled plans to update this. Unclear if she was given CPAP on hospital discharge for sleep apnea or chronic respiratory failure secondary to her morbid obesity with obesity hypoventilation. Plan-DME/[facility]-auto titration for pressure check and evaluate machine.

## 2013-04-29 NOTE — Assessment & Plan Note (Addendum)
She is trying to be compliant with Xolair. Unclear why she associated wheezing on awakening after clonazepam. Plan-Depo-Medrol, medication review

## 2013-04-29 NOTE — Assessment & Plan Note (Signed)
No success at weight loss 

## 2013-04-30 ENCOUNTER — Telehealth: Payer: Self-pay | Admitting: Internal Medicine

## 2013-04-30 DIAGNOSIS — J45901 Unspecified asthma with (acute) exacerbation: Secondary | ICD-10-CM

## 2013-04-30 NOTE — Telephone Encounter (Signed)
Called and spoke with pt and she stated that she has been sick for about 2 weeks.  She stated that she has been in and out of doctor offices and the hospital with the same complaints and she stated that no one has done cxr on her to see what is going on. She stated that these symptoms are worse at night with body aches, fever, weak, using her neb tx more often and her inhaler, she has to sleep sitting up or on her side to try and sleep.  She stated that she can go to the hospital if CY wants her to.  Please advise. Thanks  Allergies  Allergen Reactions  . Ace Inhibitors Other (See Comments)     Cough  . Percocet [Oxycodone-Acetaminophen] Itching    Ok with benadryl     Current Outpatient Prescriptions on File Prior to Visit  Medication Sig Dispense Refill  . albuterol (PROVENTIL HFA;VENTOLIN HFA) 108 (90 BASE) MCG/ACT inhaler Inhale 2 puffs into the lungs every 6 (six) hours as needed for wheezing.      . clonazePAM (KLONOPIN) 0.5 MG tablet Take 1 mg by mouth at bedtime.      . mometasone-formoterol (DULERA) 100-5 MCG/ACT AERO Inhale 2 puffs into the lungs 2 (two) times daily.      [name] omalizumab (XOLAIR) 150 MG injection Inject 225 mg into the skin every 14 (fourteen) days.      [name] omeprazole (PRILOSEC) 20 MG capsule Take 20 mg by mouth daily as needed (for heartburn).       [name] oxyCODONE-acetaminophen (PERCOCET) 10-325 MG per tablet Take 1 tablet by mouth every 6 (six) hours as needed for pain.  45 tablet  0  . predniSONE (DELTASONE) 20 MG tablet Take 3 tablets (60 mg total) by mouth daily with breakfast.  30 tablet  0  . vitamin B-12 (CYANOCOBALAMIN) 1000 MCG tablet Take 1,000 mcg by mouth daily.       No current facility-administered medications on file prior to visit.

## 2013-04-30 NOTE — Telephone Encounter (Signed)
Ok to order CXR outpt   Dx asthma exacerbation  Since she has seen doctors, I don't have anything new to offer right now. She may have to go to ER this weekend, if she can't wait to be seen by me or TP next week.

## 2013-04-30 NOTE — Telephone Encounter (Signed)
Spoke with pt and notified of recs per CDY  She verbalized understanding  Order for cxr was placed

## 2013-05-03 ENCOUNTER — Encounter (HOSPITAL_COMMUNITY): Payer: Self-pay | Admitting: Emergency Medicine

## 2013-05-03 ENCOUNTER — Emergency Department (HOSPITAL_COMMUNITY): Payer: Commercial Managed Care - PPO

## 2013-05-03 ENCOUNTER — Emergency Department (HOSPITAL_COMMUNITY)
Admission: EM | Admit: 2013-05-03 | Discharge: 2013-05-03 | Disposition: A | Discharge status: Home / Self Care | Payer: Commercial Managed Care - PPO | Attending: Emergency Medicine | Admitting: Emergency Medicine

## 2013-05-03 DIAGNOSIS — L83 Acanthosis nigricans: Secondary | ICD-10-CM | POA: Insufficient documentation

## 2013-05-03 DIAGNOSIS — K219 Gastro-esophageal reflux disease without esophagitis: Secondary | ICD-10-CM | POA: Insufficient documentation

## 2013-05-03 DIAGNOSIS — J209 Acute bronchitis, unspecified: Secondary | ICD-10-CM | POA: Insufficient documentation

## 2013-05-03 DIAGNOSIS — R062 Wheezing: Secondary | ICD-10-CM | POA: Insufficient documentation

## 2013-05-03 DIAGNOSIS — Z888 Allergy status to other drugs, medicaments and biological substances status: Secondary | ICD-10-CM | POA: Insufficient documentation

## 2013-05-03 DIAGNOSIS — J449 Chronic obstructive pulmonary disease, unspecified: Secondary | ICD-10-CM | POA: Insufficient documentation

## 2013-05-03 DIAGNOSIS — J40 Bronchitis, not specified as acute or chronic: Secondary | ICD-10-CM

## 2013-05-03 DIAGNOSIS — R0602 Shortness of breath: Secondary | ICD-10-CM | POA: Insufficient documentation

## 2013-05-03 DIAGNOSIS — R5381 Other malaise: Secondary | ICD-10-CM | POA: Insufficient documentation

## 2013-05-03 DIAGNOSIS — F329 Major depressive disorder, single episode, unspecified: Secondary | ICD-10-CM | POA: Insufficient documentation

## 2013-05-03 DIAGNOSIS — Z885 Allergy status to narcotic agent status: Secondary | ICD-10-CM | POA: Insufficient documentation

## 2013-05-03 DIAGNOSIS — Z8709 Personal history of other diseases of the respiratory system: Secondary | ICD-10-CM | POA: Insufficient documentation

## 2013-05-03 DIAGNOSIS — I10 Essential (primary) hypertension: Secondary | ICD-10-CM | POA: Insufficient documentation

## 2013-05-03 DIAGNOSIS — M6281 Muscle weakness (generalized): Secondary | ICD-10-CM | POA: Insufficient documentation

## 2013-05-03 DIAGNOSIS — J4489 Other specified chronic obstructive pulmonary disease: Secondary | ICD-10-CM | POA: Insufficient documentation

## 2013-05-03 DIAGNOSIS — Z8619 Personal history of other infectious and parasitic diseases: Secondary | ICD-10-CM | POA: Insufficient documentation

## 2013-05-03 DIAGNOSIS — Z8742 Personal history of other diseases of the female genital tract: Secondary | ICD-10-CM | POA: Insufficient documentation

## 2013-05-03 DIAGNOSIS — F3289 Other specified depressive episodes: Secondary | ICD-10-CM | POA: Insufficient documentation

## 2013-05-03 DIAGNOSIS — R5383 Other fatigue: Secondary | ICD-10-CM

## 2013-05-03 DIAGNOSIS — Z87891 Personal history of nicotine dependence: Secondary | ICD-10-CM | POA: Insufficient documentation

## 2013-05-03 DIAGNOSIS — G4700 Insomnia, unspecified: Secondary | ICD-10-CM | POA: Insufficient documentation

## 2013-05-03 DIAGNOSIS — Z79899 Other long term (current) drug therapy: Secondary | ICD-10-CM | POA: Insufficient documentation

## 2013-05-03 DIAGNOSIS — R112 Nausea with vomiting, unspecified: Secondary | ICD-10-CM | POA: Insufficient documentation

## 2013-05-03 DIAGNOSIS — J45909 Unspecified asthma, uncomplicated: Secondary | ICD-10-CM

## 2013-05-03 DIAGNOSIS — G4730 Sleep apnea, unspecified: Secondary | ICD-10-CM | POA: Insufficient documentation

## 2013-05-03 DIAGNOSIS — J111 Influenza due to unidentified influenza virus with other respiratory manifestations: Secondary | ICD-10-CM

## 2013-05-03 LAB — CBC WITH DIFFERENTIAL/PLATELET
Basophils Absolute: 0 10*3/uL (ref 0.0–0.1)
Basophils Relative: 0 % (ref 0–1)
EOS ABS: 0.4 10*3/uL (ref 0.0–0.7)
Eosinophils Relative: 2 % (ref 0–5)
HCT: 37.3 % (ref 36.0–46.0)
Hemoglobin: 12.1 g/dL (ref 12.0–15.0)
LYMPHS ABS: 3.9 10*3/uL (ref 0.7–4.0)
LYMPHS PCT: 24 % (ref 12–46)
MCH: 27.8 pg (ref 26.0–34.0)
MCHC: 32.4 g/dL (ref 30.0–36.0)
MCV: 85.7 fL (ref 78.0–100.0)
Monocytes Absolute: 1.1 10*3/uL — ABNORMAL HIGH (ref 0.1–1.0)
Monocytes Relative: 7 % (ref 3–12)
NEUTROS PCT: 67 % (ref 43–77)
Neutro Abs: 11.1 10*3/uL — ABNORMAL HIGH (ref 1.7–7.7)
Platelets: 378 10*3/uL (ref 150–400)
RBC: 4.35 MIL/uL (ref 3.87–5.11)
RDW: 16.3 % — ABNORMAL HIGH (ref 11.5–15.5)
WBC: 16.5 10*3/uL — AB (ref 4.0–10.5)

## 2013-05-03 LAB — BASIC METABOLIC PANEL
BUN: 9 mg/dL (ref 6–23)
CHLORIDE: 102 meq/L (ref 96–112)
CO2: 28 meq/L (ref 19–32)
Calcium: 8 mg/dL — ABNORMAL LOW (ref 8.4–10.5)
Creatinine, Ser: 0.83 mg/dL (ref 0.50–1.10)
GFR calc Af Amer: >90 mL/min (ref >90)
GFR, EST NON AFRICAN AMERICAN: 87 mL/min — AB (ref >90)
GLUCOSE: 109 mg/dL — AB (ref 70–99)
POTASSIUM: 3.4 meq/L — AB (ref 3.7–5.3)
Sodium: 141 mEq/L (ref 137–147)

## 2013-05-03 MED ORDER — PREDNISONE 20 MG PO TABS: ORAL_TABLET | ORAL | Status: DC

## 2013-05-03 MED ORDER — IPRATROPIUM BROMIDE 0.02 % IN SOLN
0.5000 mg | Freq: Once | RESPIRATORY_TRACT | Status: AC
  Administered 2013-05-03: 0.5 mg via RESPIRATORY_TRACT
  Filled 2013-05-03: qty 2.5

## 2013-05-03 MED ORDER — ALBUTEROL SULFATE (2.5 MG/3ML) 0.083% IN NEBU: 5.0000 mg | INHALATION_SOLUTION | Freq: Once | RESPIRATORY_TRACT | Status: DC

## 2013-05-03 MED ORDER — IPRATROPIUM BROMIDE 0.02 % IN SOLN: 0.5000 mg | RESPIRATORY_TRACT | Status: DC | PRN

## 2013-05-03 MED ORDER — METHYLPREDNISOLONE SODIUM SUCC 125 MG IJ SOLR
125.0000 mg | Freq: Once | INTRAMUSCULAR | Status: AC
  Administered 2013-05-03: 125 mg via INTRAVENOUS
  Filled 2013-05-03: qty 2

## 2013-05-03 MED ORDER — ALBUTEROL SULFATE HFA 108 (90 BASE) MCG/ACT IN AERS
2.0000 | INHALATION_SPRAY | RESPIRATORY_TRACT | Status: DC | PRN
  Administered 2013-05-03: 2 via RESPIRATORY_TRACT
  Filled 2013-05-03: qty 6.7

## 2013-05-03 MED ORDER — ALBUTEROL SULFATE (2.5 MG/3ML) 0.083% IN NEBU
5.0000 mg | INHALATION_SOLUTION | Freq: Once | RESPIRATORY_TRACT | Status: AC
  Administered 2013-05-03: 5 mg via RESPIRATORY_TRACT
  Filled 2013-05-03: qty 6

## 2013-05-03 MED ORDER — ALBUTEROL SULFATE (2.5 MG/3ML) 0.083% IN NEBU
10.0000 mg | INHALATION_SOLUTION | Freq: Once | RESPIRATORY_TRACT | Status: AC
  Administered 2013-05-03: 10 mg via RESPIRATORY_TRACT
  Filled 2013-05-03: qty 12

## 2013-05-03 MED ORDER — OSELTAMIVIR PHOSPHATE 75 MG PO CAPS: 75.0000 mg | ORAL_CAPSULE | Freq: Two times a day (BID) | ORAL | Status: DC

## 2013-05-03 NOTE — Progress Notes (Signed)
Pt tolerated 1hr CAT with no complications noted. BBS diminished with slight wheezing post CAT. No complications noted. RT will monitor.

## 2013-05-03 NOTE — ED Notes (Signed)
Pt c/o increased asthma and SOB with cough; pt sts generalized weakness and body aches

## 2013-05-03 NOTE — ED Provider Notes (Signed)
CSN: [id number]     Arrival date & time 05/03/13  1240 History   First MD Initiated Contact with Patient 05/03/13 1650     Chief Complaint  Patient presents with  . Asthma   (Consider location/radiation/quality/duration/timing/severity/associated sxs/prior Treatment) HPI Comments: Patient presents to the ER for evaluation of continued asthma symptoms. Patient reports that she has been sick for approximately 2 weeks. Patient has been treated with Zithromax and prednisone without improvement. She has been using her albuterol inhaler multiple times daily without improvement in her breathing. Patient reports that symptoms seem to get worse at night. She is getting more short of breath when she walks. Patient continues to have cough which is nonproductive. She has had nausea and vomiting earlier in the week, this has resolved. No diarrhea. Patient has complaints of progressively worsening generalized weakness.  Patient is a 43 y.o. female presenting with asthma.  Asthma Associated symptoms include shortness of breath.    Past Medical History  Diagnosis Date  . Acanthosis nigricans   . Menorrhagia   . Hypertension, essential   . Insomnia   . Morbid obesity   . Helicobacter pylori (H. pylori) infection   . Sleep apnea     Sleep study 2008 - mild OSA, not enough events to titrate CPAP  . COPD (chronic obstructive pulmonary disease)     PFTs in 2002, FEV1/FVC 65, no post bronchodilater test done  . Asthma     Followed by Dr. Annamaria Boots (pulmonology); receives every other week omalizumab injections; has frequent exacerbations  . Depression   . GERD (gastroesophageal reflux disease)   . Tobacco user   . Obesity    Past Surgical History  Procedure Laterality Date  . Tubal ligation  1996    bilateral  . Breast reduction surgery  09/2011   Family History  Problem Relation Age of Onset  . Asthma Daughter   . Cancer Paternal Aunt   . Asthma Maternal Grandmother   . Hypertension Mother     History  Substance Use Topics  . Smoking status: Former Smoker -- 0.25 packs/day for 9 years    Types: Cigarettes    Quit date: 10/04/2012  . Smokeless tobacco: Never Used  . Alcohol Use: No   OB History   Grav Para Term Preterm Abortions TAB SAB Ect Mult Living                 Review of Systems  Constitutional: Positive for fatigue. Negative for fever.  Respiratory: Positive for cough, shortness of breath and wheezing.   All other systems reviewed and are negative.    Allergies  Ace inhibitors and Percocet  Home Medications   Current Outpatient Rx  Name  Route  Sig  Dispense  Refill  . albuterol (PROVENTIL HFA;VENTOLIN HFA) 108 (90 BASE) MCG/ACT inhaler   Inhalation   Inhale 2 puffs into the lungs every 6 (six) hours as needed for wheezing.         . clonazePAM (KLONOPIN) 0.5 MG tablet   Oral   Take 1 mg by mouth at bedtime.         . mometasone-formoterol (DULERA) 100-5 MCG/ACT AERO   Inhalation   Inhale 2 puffs into the lungs 2 (two) times daily.         [name] omalizumab (XOLAIR) 150 MG injection   Subcutaneous   Inject 225 mg into the skin every 14 (fourteen) days. Had inj last 04-27-13         . omeprazole (  PRILOSEC) 20 MG capsule   Oral   Take 20 mg by mouth daily as needed (for heartburn).          [name] oxyCODONE-acetaminophen (PERCOCET) 10-325 MG per tablet   Oral   Take 1 tablet by mouth every 6 (six) hours as needed for pain. For back pain         . predniSONE (DELTASONE) 20 MG tablet   Oral   Take 3 tablets (60 mg total) by mouth daily with breakfast.   30 tablet   0   . vitamin B-12 (CYANOCOBALAMIN) 1000 MCG tablet   Oral   Take 1,000 mcg by mouth daily.         [name] zolpidem (AMBIEN) 10 MG tablet   Oral   Take 10 mg by mouth daily as needed for sleep.          BP 170/79  Pulse 81  Temp(Src) 99 F (37.2 C) (Oral)  Resp 20  SpO2 100%  LMP 04/04/2013 Physical Exam  Constitutional: She is oriented to person, place, and time.  She appears well-developed and well-nourished. No distress.  HENT:  Head: Normocephalic and atraumatic.  Right Ear: Hearing normal.  Left Ear: Hearing normal.  Nose: Nose normal.  Mouth/Throat: Oropharynx is clear and moist and mucous membranes are normal.  Eyes: Conjunctivae and EOM are normal. Pupils are equal, round, and reactive to light.  Neck: Normal range of motion. Neck supple.  Cardiovascular: Regular rhythm, S1 normal and S2 normal.  Exam reveals no gallop and no friction rub.   No murmur heard. Pulmonary/Chest: Effort normal. No respiratory distress. She has wheezes. She exhibits no tenderness.  Abdominal: Soft. Normal appearance and bowel sounds are normal. There is no hepatosplenomegaly. There is no tenderness. There is no rebound, no guarding, no tenderness at McBurney's point and negative Murphy's sign. No hernia.  Musculoskeletal: Normal range of motion.  Neurological: She is alert and oriented to person, place, and time. She has normal strength. No cranial nerve deficit or sensory deficit. Coordination normal. GCS eye subscore is 4. GCS verbal subscore is 5. GCS motor subscore is 6.  Skin: Skin is warm, dry and intact. No rash noted. No cyanosis.  Psychiatric: She has a normal mood and affect. Her speech is normal and behavior is normal. Thought content normal.    ED Course  Procedures (including critical care time) Labs Review Labs Reviewed  CBC WITH DIFFERENTIAL  BASIC METABOLIC PANEL   Imaging Review Dg Chest 2 View (if Patient Has Fever And/or Copd)  05/03/2013   CLINICAL DATA:  Short of breath.  Asthma.  EXAM: CHEST  2 VIEW  COMPARISON:  04/15/2013.  FINDINGS: Cardiopericardial silhouette within normal limits. Mediastinal contours normal. Trachea midline. No airspace disease or effusion.  IMPRESSION: No active cardiopulmonary disease.   Electronically Signed   By: Dereck Ligas M.D.   On: 05/03/2013 13:34    EKG Interpretation   None       MDM  Diagnosis:  Asthmatic bronchitis  Patient presents to ER for evaluation of difficulty breathing. Patient has been experiencing symptoms for several weeks. She has been treated with prednisone, bronchodilators and has had a Z-Pak. Patient reports that in the last couple of days her symptoms have worsened. Chest x-ray is clear, no sign of pneumonia. Patient did have wheezing but was not hypoxic. Her symptoms significantly improved with continuous nebulizer here as well as Solu-Medrol. Patient will be discharged with repeat course of prednisone in a tapering fashion  as well as albuterol and Atrovent. Because of her new onset chills, body aches, concomittent flu  is considered. Was treated empirically with Tamiflu as well.    Orpah Greek, MD 05/03/13 2111

## 2013-05-03 NOTE — Discharge Instructions (Signed)
Asthma, Adult Asthma is a recurring condition in which the airways tighten and narrow. Asthma can make it difficult to breathe. It can cause coughing, wheezing, and shortness of breath. Asthma episodes (also called asthma attacks) range from minor to life-threatening. Asthma cannot be cured, but medicines and lifestyle changes can help control it. CAUSES Asthma is believed to be caused by inherited (genetic) and environmental factors, but its exact cause is unknown. Asthma may be triggered by allergens, lung infections, or irritants in the air. Asthma triggers are different for each person. Common triggers include:   Animal dander.  Dust mites.  Cockroaches.  Pollen from trees or grass.  Mold.  Smoke.  Air pollutants such as dust, household cleaners, hair sprays, aerosol sprays, paint fumes, strong chemicals, or strong odors.  Cold air, weather changes, and winds (which increase molds and pollens in the air).  Strong emotional expressions such as crying or laughing hard.  Stress.  Certain medicines (such as aspirin) or types of drugs (such as beta-blockers).  Sulfites in foods and drinks. Foods and drinks that may contain sulfites include dried fruit, potato chips, and sparkling grape juice.  Infections or inflammatory conditions such as the flu, a cold, or an inflammation of the nasal membranes (rhinitis).  Gastroesophageal reflux disease (GERD).  Exercise or strenuous activity. SYMPTOMS Symptoms may occur immediately after asthma is triggered or many hours later. Symptoms include:  Wheezing.  Excessive nighttime or early morning coughing.  Frequent or severe coughing with a common cold.  Chest tightness.  Shortness of breath. DIAGNOSIS  The diagnosis of asthma is made by a review of your medical history and a physical exam. Tests may also be performed. These may include:  Lung function studies. These tests show how much air you breath in and out.  Allergy  tests.  Imaging tests such as X-rays. TREATMENT  Asthma cannot be cured, but it can usually be controlled. Treatment involves identifying and avoiding your asthma triggers. It also involves medicines. There are 2 classes of medicine used for asthma treatment:   Controller medicines. These prevent asthma symptoms from occurring. They are usually taken every day.  Reliever or rescue medicines. These quickly relieve asthma symptoms. They are used as needed and provide short-term relief. Your health care provider will help you create an asthma action plan. An asthma action plan is a written plan for managing and treating your asthma attacks. It includes a list of your asthma triggers and how they may be avoided. It also includes information on when medicines should be taken and when their dosage should be changed. An action plan may also involve the use of a device called a peak flow meter. A peak flow meter measures how well the lungs are working. It helps you monitor your condition. HOME CARE INSTRUCTIONS   Take medicine as directed by your health care provider. Speak with your health care provider if you have questions about how or when to take the medicines.  Use a peak flow meter as directed by your health care provider. Record and keep track of readings.  Understand and use the action plan to help minimize or stop an asthma attack without needing to seek medical care.  Control your home environment in the following ways to help prevent asthma attacks:  Do not smoke. Avoid being exposed to secondhand smoke.  Change your heating and air conditioning filter regularly.  Limit your use of fireplaces and wood stoves.  Get rid of pests (such as roaches and  mice) and their droppings.  Throw away plants if you see mold on them.  Clean your floors and dust regularly. Use unscented cleaning products.  Try to have someone else vacuum for you regularly. Stay out of rooms while they are being  vacuumed and for a short while afterward. If you vacuum, use a dust mask from a hardware store, a double-layered or microfilter vacuum cleaner bag, or a vacuum cleaner with a HEPA filter.  Replace carpet with wood, tile, or vinyl flooring. Carpet can trap dander and dust.  Use allergy-proof pillows, mattress covers, and box spring covers.  Wash bed sheets and blankets every week in hot water and dry them in a dryer.  Use blankets that are made of polyester or cotton.  Clean bathrooms and kitchens with bleach. If possible, have someone repaint the walls in these rooms with mold-resistant paint. Keep out of the rooms that are being cleaned and painted.  Wash hands frequently. SEEK MEDICAL CARE IF:   You have wheezing, shortness of breath, or a cough even if taking medicine to prevent attacks.  The colored mucus you cough up (sputum) is thicker than usual.  Your sputum changes from clear or white to yellow, green, gray, or bloody.  You have any problems that may be related to the medicines you are taking (such as a rash, itching, swelling, or trouble breathing).  You are using a reliever medicine more than 2 3 times per week.  Your peak flow is still at 50 79% of you personal best after following your action plan for 1 hour. SEEK IMMEDIATE MEDICAL CARE IF:   You seem to be getting worse and are unresponsive to treatment during an asthma attack.  You are short of breath even at rest.  You get short of breath when doing very little physical activity.  You have difficulty eating, drinking, or talking due to asthma symptoms.  You develop chest pain.  You develop a fast heartbeat.  You have a bluish color to your lips or fingernails.  You are lightheaded, dizzy, or faint.  Your peak flow is less than 50% of your personal best.  You have a fever or persistent symptoms for more than 2 3 days.  You have a fever and symptoms suddenly get worse. MAKE SURE YOU:   Understand these  instructions.  Will watch your condition.  Will get help right away if you are not doing well or get worse. Document Released: 04/15/2005 Document Revised: 12/16/2012 Document Reviewed: 11/12/2012 [facility] Patient Information 2014 [place], [place].  Bronchitis Bronchitis is the body's way of reacting to injury and/or infection (inflammation) of the bronchi. Bronchi are the air tubes that extend from the windpipe into the lungs. If the inflammation becomes severe, it may cause shortness of breath. CAUSES  Inflammation may be caused by:  A virus.  Germs (bacteria).  Dust.  Allergens.  Pollutants and many other irritants. The cells lining the bronchial tree are covered with tiny hairs (cilia). These constantly beat upward, away from the lungs, toward the mouth. This keeps the lungs free of pollutants. When these cells become too irritated and are unable to do their job, mucus begins to develop. This causes the characteristic cough of bronchitis. The cough clears the lungs when the cilia are unable to do their job. Without either of these protective mechanisms, the mucus would settle in the lungs. Then you would develop pneumonia. Smoking is a common cause of bronchitis and can contribute to pneumonia. Stopping this habit  is the single most important thing you can do to help yourself. TREATMENT   Your caregiver may prescribe an antibiotic if the cough is caused by bacteria. Also, medicines that open up your airways make it easier to breathe. Your caregiver may also recommend or prescribe an expectorant. It will loosen the mucus to be coughed up. Only take over-the-counter or prescription medicines for pain, discomfort, or fever as directed by your caregiver.  Removing whatever causes the problem (smoking, for example) is critical to preventing the problem from getting worse.  Cough suppressants may be prescribed for relief of cough symptoms.  Inhaled medicines may be prescribed to help  with symptoms now and to help prevent problems from returning.  For those with recurrent (chronic) bronchitis, there may be a need for steroid medicines. SEEK IMMEDIATE MEDICAL CARE IF:   During treatment, you develop more pus-like mucus (purulent sputum).  You have a fever.  You become progressively more ill.  You have increased difficulty breathing, wheezing, or shortness of breath. It is necessary to seek immediate medical care if you are elderly or sick from any other disease. MAKE SURE YOU:   Understand these instructions.  Will watch your condition.  Will get help right away if you are not doing well or get worse. Document Released: 04/15/2005 Document Revised: 12/16/2012 Document Reviewed: 12/08/2012 [facility] Patient Information 2014 [place].  Influenza, Adult Influenza ("the flu") is a viral infection of the respiratory tract. It occurs more often in winter months because people spend more time in close contact with one another. Influenza can make you feel very sick. Influenza easily spreads from person to person (contagious). CAUSES  Influenza is caused by a virus that infects the respiratory tract. You can catch the virus by breathing in droplets from an infected person's cough or sneeze. You can also catch the virus by touching something that was recently contaminated with the virus and then touching your mouth, nose, or eyes. SYMPTOMS  Symptoms typically last 4 to 10 days and may include:  Fever.  Chills.  Headache, body aches, and muscle aches.  Sore throat.  Chest discomfort and cough.  Poor appetite.  Weakness or feeling tired.  Dizziness.  Nausea or vomiting. DIAGNOSIS  Diagnosis of influenza is often made based on your history and a physical exam. A nose or throat swab test can be done to confirm the diagnosis. RISKS AND COMPLICATIONS You may be at risk for a more severe case of influenza if you smoke cigarettes, have diabetes, have chronic  heart disease (such as heart failure) or lung disease (such as asthma), or if you have a weakened immune system. Elderly people and pregnant women are also at risk for more serious infections. The most common complication of influenza is a lung infection (pneumonia). Sometimes, this complication can require emergency medical care and may be life-threatening. PREVENTION  An annual influenza vaccination (flu shot) is the best way to avoid getting influenza. An annual flu shot is now routinely recommended for all adults in the [place] TREATMENT  In mild cases, influenza goes away on its own. Treatment is directed at relieving symptoms. For more severe cases, your caregiver may prescribe antiviral medicines to shorten the sickness. Antibiotic medicines are not effective, because the infection is caused by a virus, not by bacteria. HOME CARE INSTRUCTIONS  Only take over-the-counter or prescription medicines for pain, discomfort, or fever as directed by your caregiver.  Use a cool mist humidifier to make breathing easier.  Get plenty  of rest until your temperature returns to normal. This usually takes 3 to 4 days.  Drink enough fluids to keep your urine clear or pale yellow.  Cover your mouth and nose when coughing or sneezing, and wash your hands well to avoid spreading the virus.  Stay home from work or school until your fever has been gone for at least 1 full day. SEEK MEDICAL CARE IF:   You have chest pain or a deep cough that worsens or produces more mucus.  You have nausea, vomiting, or diarrhea. SEEK IMMEDIATE MEDICAL CARE IF:   You have difficulty breathing, shortness of breath, or your skin or nails turn bluish.  You have severe neck pain or stiffness.  You have a severe headache, facial pain, or earache.  You have a worsening or recurring fever.  You have nausea or vomiting that cannot be controlled. MAKE SURE YOU:  Understand these instructions.  Will watch your  condition.  Will get help right away if you are not doing well or get worse. Document Released: 04/12/2000 Document Revised: 10/15/2011 Document Reviewed: 07/15/2011 [facility] Patient Information 2014 [place], [place].

## 2013-05-04 NOTE — Progress Notes (Signed)
Case discussed with Dr. Schooler soon after the resident saw the patient.  We reviewed the resident's history and exam and pertinent patient test results.  I agree with the assessment, diagnosis, and plan of care documented in the resident's note. 

## 2013-05-07 ENCOUNTER — Ambulatory Visit: Payer: Commercial Managed Care - PPO | Admitting: Internal Medicine

## 2013-05-07 ENCOUNTER — Telehealth: Payer: Self-pay | Admitting: Internal Medicine

## 2013-05-07 ENCOUNTER — Ambulatory Visit (INDEPENDENT_AMBULATORY_CARE_PROVIDER_SITE_OTHER): Payer: Commercial Managed Care - PPO | Admitting: Internal Medicine

## 2013-05-07 ENCOUNTER — Encounter: Payer: Self-pay | Admitting: Internal Medicine

## 2013-05-07 VITALS — BP 172/92 | HR 87 | Temp 99.3°F | Ht 67.0 in | Wt 376.3 lb

## 2013-05-07 DIAGNOSIS — M546 Pain in thoracic spine: Secondary | ICD-10-CM

## 2013-05-07 DIAGNOSIS — J45901 Unspecified asthma with (acute) exacerbation: Secondary | ICD-10-CM

## 2013-05-07 DIAGNOSIS — G4700 Insomnia, unspecified: Secondary | ICD-10-CM

## 2013-05-07 MED ORDER — ZOLPIDEM TARTRATE 10 MG PO TABS: 10.0000 mg | ORAL_TABLET | Freq: Every day | ORAL | Status: DC | PRN

## 2013-05-07 MED ORDER — IPRATROPIUM BROMIDE 0.02 % IN SOLN: 0.5000 mg | RESPIRATORY_TRACT | Status: DC | PRN

## 2013-05-07 MED ORDER — ALBUTEROL SULFATE (2.5 MG/3ML) 0.083% IN NEBU: 2.5000 mg | INHALATION_SOLUTION | RESPIRATORY_TRACT | Status: DC | PRN

## 2013-05-07 MED ORDER — OXYCODONE-ACETAMINOPHEN 10-325 MG PO TABS: 1.0000 | ORAL_TABLET | Freq: Four times a day (QID) | ORAL | Status: DC | PRN

## 2013-05-07 NOTE — Telephone Encounter (Signed)
Called and spoke with pt. Advised her we did not have any samples at this time. Nothing further needed

## 2013-05-07 NOTE — Progress Notes (Signed)
Case discussed with Dr. Sharda soon after the resident saw the patient.  We reviewed the resident's history and exam and pertinent patient test results.  I agree with the assessment, diagnosis, and plan of care documented in the resident's note. 

## 2013-05-07 NOTE — Assessment & Plan Note (Signed)
Acute x 3 weeks, no relief with NSAIDs or muscle relaxers.  DDx includes muscular tension vs compression fracture (intermittent steroids for years due to uncontrolled asthma) vs disk disease.    -Short course of percocet 10-325, 1 q 6h prn  refilled #30 -Thoracic XR - if compression fracture, prescribe nasal calcitonin, if not, refer to sports medicine given known cervical disease, role for injection and PT

## 2013-05-07 NOTE — Progress Notes (Signed)
Subjective:   Patient ID: April [name] female   DOB: 08/23/1970 43 y.o.   MRN: [id number]  Chief Complaint  Patient presents with  . Follow-up    [facility] ER this week -mid back pain cont. Still on Prednisone - asthma about the same. - has about 10 left Prednisone.  . Medication Refill    Only Ambien.    HPI: [name] is a 43 y.o. woman with VERY poorly controlled asthma, as well as morbid obesity, tobacco abuse, HTN, GERD and OSA who presents for above reasons.  She was seen in the ED on 05/03/13 for continued asthma sx, after illness for about 2 weeks and had already been treated with zithromax & prednisone. Her symptoms improved after continuous nebs & solumedrol and she was discharged with a prednisone taper & albuterol + atrovent, as well as empirically treated for flu with tamiflu - she completed this course  She tells me she been sick for about 1 month - reports she felt chest congestion before went to hospital, but that has improved.  Improved with IV solumedrol, but feels that the prednisone is not helping, but only making her gain weight.  Reports history of Slipped disk in back - plans to make an appointment with Sports medicine.   To note, she does follow with Dr. Annamaria Boots for her asthma.  She has a Xolair injection on 04/26/13 - next due upcoming Monday (05/10/13)  Hasn't taken prednisone today (she feels that it isn't helping) - currently on 60mg  daily  Running out of inhalers. Taking atrovent and albuterol, but irregularly taking.   Quit smoking last year, but around many people that smoke.   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  Back has been hurting for about 3 weeks. Similar pain to before she had a breast reduction - aching, burning, hurting.  Better with rest.  Improved with percocet for 4-5 hours (takes 1 pill about every 6 hours). Tried tylenol & ibuprofen (600 and 800 doses) without relief. No weakness in legs or  arms. No bowel or urinary incontinence. No numbness/tingling.  Review of Systems: Constitutional: Denies appetite change and fatigue.  HEENT: Denies photophobia, eye pain, redness, hearing loss, ear pain, mouth sores, trouble swallowing, neck pain, neck stiffness and tinnitus.  Respiratory: per HPI Cardiovascular: Denies chest pain, palpitations and leg swelling.  Gastrointestinal: Denies nausea, vomiting, abdominal pain, diarrhea, constipation,blood in stool and abdominal distention.  Genitourinary: Denies dysuria, urgency, frequency, hematuria, flank pain and difficulty urinating.  Musculoskeletal: per HPI  Skin: Denies pallor, rash and wound.  Neurological: Denies dizziness, seizures, syncope, weakness, lightheadedness, numbness and headaches.    Past Medical History  Diagnosis Date  . Acanthosis nigricans   . Menorrhagia   . Hypertension, essential   . Insomnia   . Morbid obesity   . Helicobacter pylori (H. pylori) infection   . Sleep apnea     Sleep study 2008 - mild OSA, not enough events to titrate CPAP  . COPD (chronic obstructive pulmonary disease)     PFTs in 2002, FEV1/FVC 65, no post bronchodilater test done  . Asthma     Followed by Dr. Annamaria Boots (pulmonology); receives every other week omalizumab injections; has frequent exacerbations  . Depression   . GERD (gastroesophageal reflux disease)   . Tobacco user   . Obesity    Current Outpatient Prescriptions  Medication Sig Dispense Refill  . albuterol (PROVENTIL HFA;VENTOLIN HFA) 108 (90 BASE)  MCG/ACT inhaler Inhale 2 puffs into the lungs every 6 (six) hours as needed for wheezing.      . clonazePAM (KLONOPIN) 0.5 MG tablet Take 1 mg by mouth at bedtime.      [name] ipratropium (ATROVENT) 0.02 % nebulizer solution Take 2.5 mLs (0.5 mg total) by nebulization every 4 (four) hours as needed for wheezing or shortness of breath.  75 mL  3  . mometasone-formoterol (DULERA) 100-5 MCG/ACT AERO Inhale 2 puffs into the lungs 2 (two)  times daily.      [name] omalizumab (XOLAIR) 150 MG injection Inject 225 mg into the skin every 14 (fourteen) days. Had inj last 04-27-13      . omeprazole (PRILOSEC) 20 MG capsule Take 20 mg by mouth daily as needed (for heartburn).       [name] oseltamivir (TAMIFLU) 75 MG capsule Take 1 capsule (75 mg total) by mouth every 12 (twelve) hours.  10 capsule  0  . oxyCODONE-acetaminophen (PERCOCET) 10-325 MG per tablet Take 1 tablet by mouth every 6 (six) hours as needed for pain. For back pain      . predniSONE (DELTASONE) 20 MG tablet Take 3 tablets (60 mg total) by mouth daily with breakfast.  30 tablet  0  . predniSONE (DELTASONE) 20 MG tablet 3 tabs po daily x 3 days, then 2 tabs x 3 days, then 1.5 tabs x 3 days, then 1 tab x 3 days, then 0.5 tabs x 3 days  27 tablet  0  . vitamin B-12 (CYANOCOBALAMIN) 1000 MCG tablet Take 1,000 mcg by mouth daily.      [name] zolpidem (AMBIEN) 10 MG tablet Take 10 mg by mouth daily as needed for sleep.       No current facility-administered medications for this visit.   Family History  Problem Relation Age of Onset  . Asthma Daughter   . Cancer Paternal Aunt   . Asthma Maternal Grandmother   . Hypertension Mother    History   Social History  . Marital Status: Single    Spouse Name: N/A    Number of Children: 1  . 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 Smoking status: Former Smoker -- 0.25 packs/day for 9 years    Types: Cigarettes    Quit date: 10/04/2012  . Smokeless tobacco: Never Used  . Alcohol Use: No  . Drug Use: No  . Sexual Activity: Yes   Other Topics Concern  . 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     Objective:  Physical Exam: Filed Vitals:   05/07/13 1522  BP: 172/92  Pulse: 87  Temp: 99.3 F (37.4 C)  TempSrc: Oral  Height: 5\' 7"   (1.702 m)  Weight: 376 lb 4.8 oz (170.689 kg)  SpO2: 100%    HEENT: PERRL, EOMI, no scleral icterus Cardiac: RRR, no rubs, murmurs or gallops Pulm: clear to auscultation bilaterally, moving normal volumes of air Abd: soft, nontender, nondistended, BS present, obese Ext: warm and well perfused, no pedal edema Back: TTP over intrascapular thoracic spine without significant muscle tension palpable Neuro: alert and oriented X3, cranial nerves II-XII grossly intact  Assessment & Plan:  Case and care discussed with Dr. Marinda Elk.  Please see problem oriented charting for further details. Patient to return  if symptoms fail to improve.

## 2013-05-07 NOTE — Patient Instructions (Addendum)
-  Please complete your steroid taper given to you by the emergency department -While you are feeling ill, you should take both atrovent and albuterol at least 4 times per day, more often if needed - please do this for the next 2-3 days -Regarding your back pain, I am going to order a x-ray.  I will also fill a short course of percocet to get you through the weekend - if you do not have a back fracture, we will have you seen by sports medicine  Please be sure to bring all of your medications with you to every visit.  Should you have any new or worsening symptoms, please be sure to call the clinic at [phone].

## 2013-05-07 NOTE — Assessment & Plan Note (Signed)
Improved since being seen in ED on 05/03/13 - CXR without PNA. I asked that she complete steroid taper prescribed by ED and continue albuterol/atrovent nebs at least QID for the next 2-3 days, then prn.  She will be seen by pulm on 05/10/13 for xolair (omalizumab - inhibits IgE binding to mast cells and basophils, also down regulates IgE) injection. Advised avoiding contact with smoke.

## 2013-05-10 ENCOUNTER — Encounter: Payer: Self-pay | Admitting: Internal Medicine

## 2013-05-10 ENCOUNTER — Ambulatory Visit: Payer: Commercial Managed Care - PPO

## 2013-05-11 ENCOUNTER — Ambulatory Visit (HOSPITAL_COMMUNITY)
Admission: RE | Admit: 2013-05-11 | Discharge: 2013-05-11 | Disposition: A | Discharge status: Home / Self Care | Payer: Commercial Managed Care - PPO | Source: Ambulatory Visit | Attending: Internal Medicine | Admitting: Internal Medicine

## 2013-05-11 DIAGNOSIS — M546 Pain in thoracic spine: Secondary | ICD-10-CM | POA: Insufficient documentation

## 2013-05-12 ENCOUNTER — Telehealth: Payer: Self-pay | Admitting: Internal Medicine

## 2013-05-12 DIAGNOSIS — J45901 Unspecified asthma with (acute) exacerbation: Secondary | ICD-10-CM

## 2013-05-12 NOTE — Telephone Encounter (Signed)
Is Ms Dray potentially a [facility] social service patient? We could ask their help with this poorly compliant patient.

## 2013-05-12 NOTE — Telephone Encounter (Signed)
Order placed for [facility] referral for the pt.  Nothing further is needed.

## 2013-05-12 NOTE — Telephone Encounter (Signed)
i attempted to call pt but had to lmomtcb.   Melissa with [facility] stated that they spoke with pt on 04/28/13 and pt stated that she would call them back regarding her cpap.  [facility] has tried to call her several more times and have been unable to contact the pt.  Any further recs CY?  Thanks  Last ov--04/08/13 No pending appts  Allergies  Allergen Reactions  . Ace Inhibitors Other (See Comments)     Cough  . Percocet [Oxycodone-Acetaminophen] Itching    Ok with benadryl     Current Outpatient Prescriptions on File Prior to Visit  Medication Sig Dispense Refill  . albuterol (PROVENTIL) (2.5 MG/3ML) 0.083% nebulizer solution Take 3 mLs (2.5 mg total) by nebulization every 4 (four) hours as needed for wheezing or shortness of breath.  30 vial  3  . ipratropium (ATROVENT) 0.02 % nebulizer solution Take 2.5 mLs (0.5 mg total) by nebulization every 4 (four) hours as needed for wheezing or shortness of breath.  75 mL  2  . mometasone-formoterol (DULERA) 100-5 MCG/ACT AERO Inhale 2 puffs into the lungs 2 (two) times daily.      [name] omalizumab (XOLAIR) 150 MG injection Inject 225 mg into the skin every 14 (fourteen) days. Had inj last 04-27-13      . omeprazole (PRILOSEC) 20 MG capsule Take 20 mg by mouth daily as needed (for heartburn).       [name] oxyCODONE-acetaminophen (PERCOCET) 10-325 MG per tablet Take 1 tablet by mouth every 6 (six) hours as needed for pain. For back pain  30 tablet  0  . predniSONE (DELTASONE) 20 MG tablet 3 tabs po daily x 3 days, then 2 tabs x 3 days, then 1.5 tabs x 3 days, then 1 tab x 3 days, then 0.5 tabs x 3 days  27 tablet  0  . vitamin B-12 (CYANOCOBALAMIN) 1000 MCG tablet Take 1,000 mcg by mouth daily.       No current facility-administered medications on file prior to visit.

## 2013-05-14 ENCOUNTER — Ambulatory Visit (INDEPENDENT_AMBULATORY_CARE_PROVIDER_SITE_OTHER): Payer: Commercial Managed Care - PPO

## 2013-05-14 DIAGNOSIS — J45909 Unspecified asthma, uncomplicated: Secondary | ICD-10-CM

## 2013-05-14 MED ORDER — OMALIZUMAB 150 MG ~~LOC~~ SOLR
225.0000 mg | Freq: Once | SUBCUTANEOUS | Status: AC
  Administered 2013-05-14: 225 mg via SUBCUTANEOUS

## 2013-05-19 ENCOUNTER — Telehealth: Payer: Self-pay | Admitting: Internal Medicine

## 2013-05-19 NOTE — Telephone Encounter (Signed)
I called and spoke with pt. Made her aware we do not have samples at this time. Nothing further needed

## 2013-05-20 ENCOUNTER — Ambulatory Visit (INDEPENDENT_AMBULATORY_CARE_PROVIDER_SITE_OTHER): Payer: Commercial Managed Care - PPO | Admitting: Internal Medicine

## 2013-05-20 ENCOUNTER — Encounter: Payer: Self-pay | Admitting: Internal Medicine

## 2013-05-20 ENCOUNTER — Ambulatory Visit: Payer: Commercial Managed Care - PPO | Admitting: Internal Medicine

## 2013-05-20 VITALS — BP 154/98 | HR 86 | Temp 97.8°F | Ht 67.0 in | Wt 372.6 lb

## 2013-05-20 DIAGNOSIS — M545 Low back pain, unspecified: Secondary | ICD-10-CM

## 2013-05-20 DIAGNOSIS — M549 Dorsalgia, unspecified: Secondary | ICD-10-CM

## 2013-05-20 DIAGNOSIS — M546 Pain in thoracic spine: Secondary | ICD-10-CM

## 2013-05-20 DIAGNOSIS — I10 Essential (primary) hypertension: Secondary | ICD-10-CM

## 2013-05-20 MED ORDER — OXYCODONE-ACETAMINOPHEN 10-325 MG PO TABS: 1.0000 | ORAL_TABLET | Freq: Four times a day (QID) | ORAL | Status: DC | PRN

## 2013-05-20 MED ORDER — HYDROCHLOROTHIAZIDE 12.5 MG PO CAPS: 12.5000 mg | ORAL_CAPSULE | Freq: Every day | ORAL | Status: DC

## 2013-05-20 NOTE — Patient Instructions (Addendum)
General Instructions:  -I am going to refer you to sports medicine to determine if they want to do further imaging and to see if you are a candidate for injection therapy.  I am providing a short term percocet script.  If it turns out you need to be on long term percocet, you will have to discuss referral to a pain management clinic with your primary care doctor.  -I think your back pain and high blood pressure, I believe your weight is contributing. You are right about weight gain and steroids.  Please follow up with the bariatric center.   -For your high blood pressure, I am starting hydrochlorothiazide, 12.5mg  daily.  Treatment Goals:  Goals (1 Years of Data) as of 05/20/13         As of Today 05/07/13 05/03/13 05/03/13 05/03/13     Blood Pressure    . Blood Pressure < 140/90  154/98 172/92 127/68 115/87 143/107      Progress Toward Treatment Goals:  Treatment Goal 05/20/2013  Blood pressure unchanged    Self Care Goals & Plans:  Self Care Goal 05/20/2013  Manage my medications take my medicines as prescribed; bring my medications to every visit; refill my medications on time; follow the sick day instructions if I am sick  Monitor my health -  Eat healthy foods eat more vegetables; eat baked foods instead of fried foods; eat fruit for snacks and desserts; eat foods that are low in salt; eat smaller portions  Be physically active find an activity I enjoy  Meeting treatment goals maintain the current self-care plan    No flowsheet data found.   Care Management & Community Referrals:  Referral 05/20/2013  Referrals made for care management support -  Referrals made to community resources weight management

## 2013-05-20 NOTE — Progress Notes (Signed)
Subjective:   Patient ID: April [name] female   DOB: 27-Nov-1970 43 y.o.   MRN: [id number]  Chief Complaint  Patient presents with  . Follow-up    Discuss x-rays and pain.    HPI: [name] is a 43 y.o. woman with h/o morbid obesity, asthma, depression, HTN, GERD and OSA who presents to follow up regarding back pain.   I last saw patient on 05/07/13 regarding asthma. 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 Back has been hurting for about 3 weeks. Similar pain to before she had a breast reduction - aching, burning, hurting. Better with rest. Improved with percocet for 4-5 hours (takes 1 pill about every 6 hours). Tried tylenol & ibuprofen (600 and 800 doses) without relief. No weakness in legs or arms. No bowel or urinary incontinence. No numbness/tingling.  At that visit, I requested an XR given her h/o steroid use - no compression fracture on XR.  I also provided a short course of percocet to last her through the initial work up.    Today she tells me she moved some furniture at the end of Nov, and notes that is when pain may have started. But again reports that she had pain before her breast reduction.   Has done bariatric center seminars - about 4 seminars.  She reports she would be interested in proceeding.  She doesn't know how to get the paperwork filled out. She reports percocet has been very effective (affordable, $10 with insurance). Has been taking 2 percocets per day, 3 if the pain gets very bad. Doesn't have any percocet left, took last pill yesterday.   LMP 05/04/13, still regular  Review of Systems: Constitutional: Denies fever, chills, diaphoresis, appetite change and fatigue.  HEENT: Denies photophobia, eye pain, redness, hearing loss, ear pain, congestion, sore throat, sneezing, mouth sores, trouble swallowing, neck pain, neck stiffness and tinnitus. +rhinorrhea  occasionally Respiratory: breathing better, back to baseline, worse in the morning Cardiovascular: Denies chest pain, palpitations and leg swelling.  Gastrointestinal: Denies nausea, vomiting, abdominal pain, diarrhea, constipation,blood in stool and abdominal distention.  Genitourinary: Denies dysuria, urgency, frequency, hematuria, flank pain and difficulty urinating.  Musculoskeletal: per HPI Skin: Denies pallor, rash and wound.  Neurological: Denies dizziness, seizures, syncope, weakness, lightheadedness, numbness and headaches.    Past Medical History  Diagnosis Date  . Acanthosis nigricans   . Menorrhagia   . Hypertension, essential   . Insomnia   . Morbid obesity   . Helicobacter pylori (H. pylori) infection   . Sleep apnea     Sleep study 2008 - mild OSA, not enough events to titrate CPAP  . COPD (chronic obstructive pulmonary disease)     PFTs in 2002, FEV1/FVC 65, no post bronchodilater test done  . Asthma     Followed by Dr. Annamaria Boots (pulmonology); receives every other week omalizumab injections; has frequent exacerbations  . Depression   . GERD (gastroesophageal reflux disease)   . Tobacco user   . Obesity    Current Outpatient Prescriptions  Medication Sig Dispense Refill  . albuterol (PROVENTIL) (2.5 MG/3ML) 0.083% nebulizer solution Take 3 mLs (2.5 mg total) by nebulization every 4 (four) hours as needed for wheezing or shortness of breath.  30 vial  3  . ipratropium (ATROVENT) 0.02 % nebulizer solution Take 2.5 mLs (0.5 mg total) by nebulization every 4 (four) hours as needed for wheezing or shortness of  breath.  75 mL  2  . mometasone-formoterol (DULERA) 100-5 MCG/ACT AERO Inhale 2 puffs into the lungs 2 (two) times daily.      [name] omalizumab (XOLAIR) 150 MG injection Inject 225 mg into the skin every 14 (fourteen) days. Had inj last 04-27-13      . omeprazole (PRILOSEC) 20 MG capsule Take 20 mg by mouth daily as needed (for heartburn).       [name] oxyCODONE-acetaminophen  (PERCOCET) 10-325 MG per tablet Take 1 tablet by mouth every 6 (six) hours as needed for pain. For back pain  30 tablet  0  . predniSONE (DELTASONE) 20 MG tablet 3 tabs po daily x 3 days, then 2 tabs x 3 days, then 1.5 tabs x 3 days, then 1 tab x 3 days, then 0.5 tabs x 3 days  27 tablet  0  . vitamin B-12 (CYANOCOBALAMIN) 1000 MCG tablet Take 1,000 mcg by mouth daily.       No current facility-administered medications for this visit.   Family History  Problem Relation Age of Onset  . Asthma Daughter   . Cancer Paternal Aunt   . Asthma Maternal Grandmother   . Hypertension Mother    History   Social History  . Marital Status: Single    Spouse Name: N/A    Number of Children: 1  . 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 Smoking status: Former Smoker -- 0.25 packs/day for 9 years    Types: Cigarettes    Quit date: 10/04/2012  . Smokeless tobacco: Never Used  . Alcohol Use: No  . Drug Use: No  . Sexual Activity: Yes   Other Topics Concern  . 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     Objective:  Physical Exam: Filed Vitals:   05/20/13 0835  BP: 154/98  Pulse: 86  Temp: 97.8 F (36.6 C)  TempSrc: Oral  Height: 5\' 7"  (1.702 m)  Weight: 372 lb 9.6 oz (169.01 kg)  SpO2: 98%   Cardiac: RRR, no rubs, murmurs or gallops Pulm: clear to auscultation bilaterally, moving normal volumes of air Abd: soft, nontender, nondistended, BS present, obese Ext: warm and well perfused, no pedal edema, muscular tension of paraspinal thoracic muscles/trapezius Neuro: alert and oriented X3, cranial nerves II-XII grossly intact, 5/5 b/l UE & LE strength  Assessment & Plan:  Case and care discussed with Dr. Lynnae January.  Please see problem oriented charting for further details. Patient to return  in as soon as possible for routine with PCP.

## 2013-05-20 NOTE — Assessment & Plan Note (Addendum)
BP Readings from Last 3 Encounters:  05/20/13 154/98  05/07/13 172/92  05/03/13 127/68    Lab Results  Component Value Date   NA 141 05/03/2013   K 3.4* 05/03/2013   CREATININE 0.83 05/03/2013    Assessment: Blood pressure control: mildly elevated Progress toward BP goal:  unchanged Comments: her pressures are usually high during acute asthma exacerbation (though she is not having exacerbation now) - her exacerbations are frequent enough that her pressure tends to elevated much of the time  Plan: Medications:  Restart HCTZ (has been on 25mg  in the past) - will start 12.5mg  daily since she has had controlled pressures in the past few months as well Educational resources provided: brochure;handout;video BMET in 1 week - called to ask patient to return for blood work - no answer, left msg and placed future order (K was slightly low on 05/03/13, likely due to significant albuterol use)

## 2013-05-20 NOTE — Assessment & Plan Note (Addendum)
Today's history is a bit different - she endorses thoracic & lumbar back pain, and notes that it has been progressing since November, but again, similar to prior to her breast reduction.  No red flag symptoms (such as weakness, numbness/tingling, urinary/bowel incontinence).  I suspect her obesity is contributing.  She has tried NSAIDs in the past (ibuprofen, naproxen, mobic, and sulindac) and muscle relaxers, all of which have been ineffective.  She may have disc disease.  Again, cannot rule out compression fracture (given h/o intermittent steroids due to uncontrolled asthma) of lumbar spine - thoracic spine was negative.   -Lumbar spine XR -Sports med referral to determine role for further imaging (MRI) versus just PT with possible injection therapy - she has been to sports med in the past -Refill Percocet 10-325 #30 (informed her this is to bridge to her to sports med appt, if she requires long term narcotics, consider pain clinic referral) -Provided information to plug her back into the bariatric center  ADDENDUM: per suggested by Dr. Micheline Chapman of Sports medicine, will order Thoracolumbar spine MRI and refer to PT. (05/27/13)

## 2013-05-20 NOTE — Progress Notes (Signed)
Case discussed with Dr. Sharda soon after the resident saw the patient.  We reviewed the resident's history and exam and pertinent patient test results.  I agree with the assessment, diagnosis, and plan of care documented in the resident's note. 

## 2013-05-24 ENCOUNTER — Telehealth: Payer: Self-pay | Admitting: Internal Medicine

## 2013-05-24 NOTE — Telephone Encounter (Signed)
Called and made pt aware we do not have any samples at this time. Nothing further needed

## 2013-05-26 NOTE — Addendum Note (Signed)
Addended by: Hulan Fray on: 05/26/2013 09:14 PM   Modules accepted: Orders

## 2013-05-27 NOTE — Addendum Note (Signed)
Addended by: [name] on: 05/27/2013 08:29 AM   Modules accepted: Orders

## 2013-05-28 ENCOUNTER — Ambulatory Visit (INDEPENDENT_AMBULATORY_CARE_PROVIDER_SITE_OTHER): Payer: Commercial Managed Care - PPO

## 2013-05-28 DIAGNOSIS — J45909 Unspecified asthma, uncomplicated: Secondary | ICD-10-CM

## 2013-06-01 MED ORDER — OMALIZUMAB 150 MG ~~LOC~~ SOLR
225.0000 mg | Freq: Once | SUBCUTANEOUS | Status: AC
Start: 2013-06-01 — End: 2013-06-01
  Administered 2013-06-01: 225 mg via SUBCUTANEOUS

## 2013-06-02 IMAGING — CR DG CHEST 2V
2 series · 2 of 2 positions shown · non-contrast
Comparison: February 26, 2012

CLINICAL DATA: Asthma; shortness of breath

CHEST - 2 VIEW

[w chest pa *]
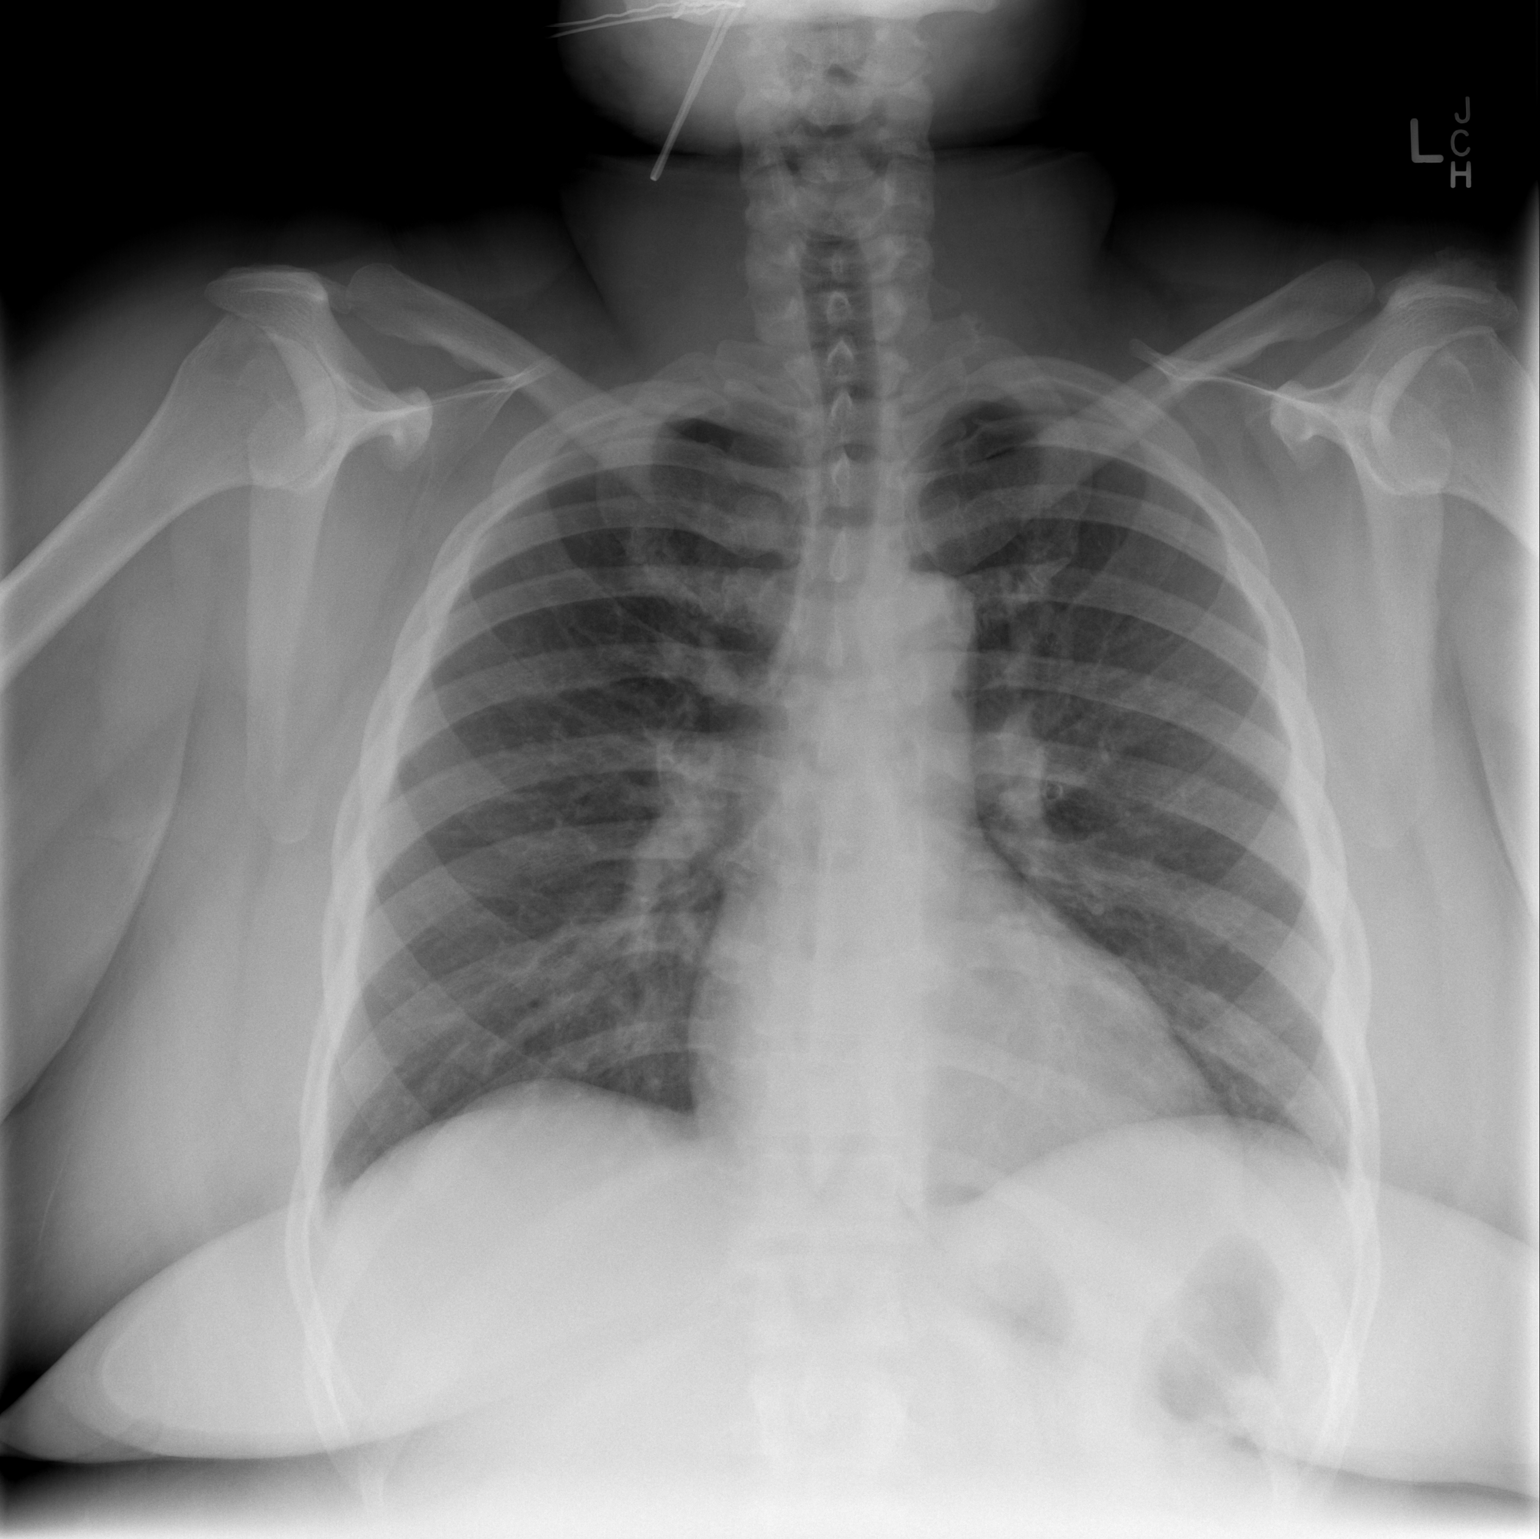

[w chest lat]
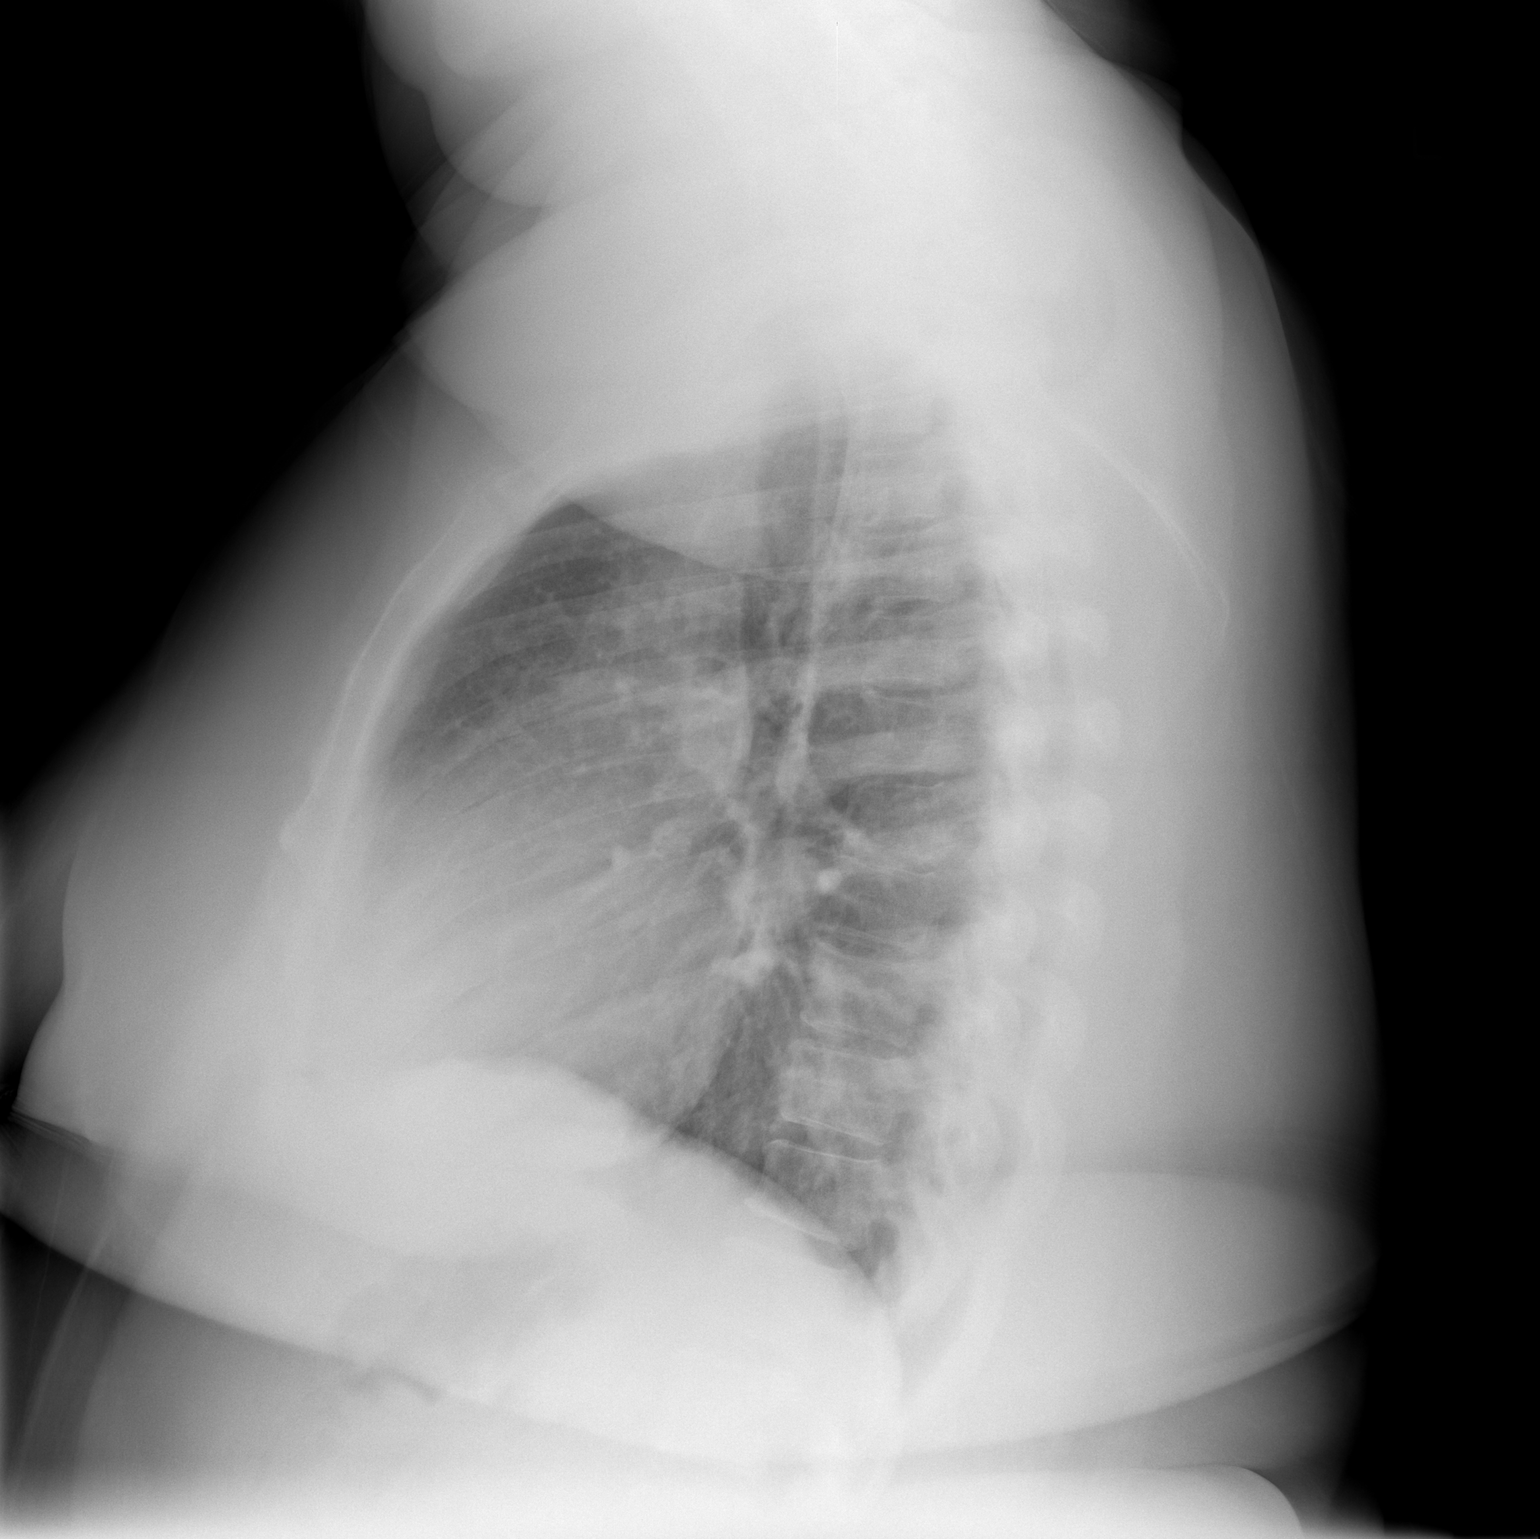

[2 of 2 positions shown; findings below may reference images not displayed]

FINDINGS: The lungs are clear.  The heart size and pulmonary
vascularity are normal.  No adenopathy.  No bone lesions.
IMPRESSION: No abnormality noted.

## 2013-06-06 ENCOUNTER — Emergency Department (HOSPITAL_COMMUNITY): Payer: Commercial Managed Care - PPO

## 2013-06-06 ENCOUNTER — Encounter (HOSPITAL_COMMUNITY): Payer: Self-pay | Admitting: Emergency Medicine

## 2013-06-06 ENCOUNTER — Observation Stay (HOSPITAL_COMMUNITY)
Admission: EM | Admit: 2013-06-06 | Discharge: 2013-06-07 | Disposition: A | Discharge status: Home / Self Care | Payer: Commercial Managed Care - PPO | Attending: Infectious Disease | Admitting: Infectious Disease

## 2013-06-06 DIAGNOSIS — R609 Edema, unspecified: Secondary | ICD-10-CM

## 2013-06-06 DIAGNOSIS — R7309 Other abnormal glucose: Secondary | ICD-10-CM

## 2013-06-06 DIAGNOSIS — Z87891 Personal history of nicotine dependence: Secondary | ICD-10-CM | POA: Insufficient documentation

## 2013-06-06 DIAGNOSIS — M546 Pain in thoracic spine: Secondary | ICD-10-CM

## 2013-06-06 DIAGNOSIS — F329 Major depressive disorder, single episode, unspecified: Secondary | ICD-10-CM | POA: Insufficient documentation

## 2013-06-06 DIAGNOSIS — M5090 Cervical disc disorder, unspecified, unspecified cervical region: Secondary | ICD-10-CM

## 2013-06-06 DIAGNOSIS — M25562 Pain in left knee: Secondary | ICD-10-CM

## 2013-06-06 DIAGNOSIS — E876 Hypokalemia: Secondary | ICD-10-CM | POA: Insufficient documentation

## 2013-06-06 DIAGNOSIS — J45901 Unspecified asthma with (acute) exacerbation: Secondary | ICD-10-CM | POA: Diagnosis present

## 2013-06-06 DIAGNOSIS — J441 Chronic obstructive pulmonary disease with (acute) exacerbation: Principal | ICD-10-CM | POA: Insufficient documentation

## 2013-06-06 DIAGNOSIS — M25571 Pain in right ankle and joints of right foot: Secondary | ICD-10-CM

## 2013-06-06 DIAGNOSIS — K219 Gastro-esophageal reflux disease without esophagitis: Secondary | ICD-10-CM

## 2013-06-06 DIAGNOSIS — R9431 Abnormal electrocardiogram [ECG] [EKG]: Secondary | ICD-10-CM

## 2013-06-06 DIAGNOSIS — I10 Essential (primary) hypertension: Secondary | ICD-10-CM | POA: Insufficient documentation

## 2013-06-06 DIAGNOSIS — R51 Headache: Secondary | ICD-10-CM

## 2013-06-06 DIAGNOSIS — Z9989 Dependence on other enabling machines and devices: Secondary | ICD-10-CM

## 2013-06-06 DIAGNOSIS — D72829 Elevated white blood cell count, unspecified: Secondary | ICD-10-CM

## 2013-06-06 DIAGNOSIS — K5900 Constipation, unspecified: Secondary | ICD-10-CM

## 2013-06-06 DIAGNOSIS — Z91199 Patient's noncompliance with other medical treatment and regimen due to unspecified reason: Secondary | ICD-10-CM

## 2013-06-06 DIAGNOSIS — M545 Low back pain, unspecified: Secondary | ICD-10-CM

## 2013-06-06 DIAGNOSIS — Z6841 Body Mass Index (BMI) 40.0 and over, adult: Secondary | ICD-10-CM | POA: Insufficient documentation

## 2013-06-06 DIAGNOSIS — G4733 Obstructive sleep apnea (adult) (pediatric): Secondary | ICD-10-CM

## 2013-06-06 DIAGNOSIS — Z9119 Patient's noncompliance with other medical treatment and regimen: Secondary | ICD-10-CM

## 2013-06-06 DIAGNOSIS — G4700 Insomnia, unspecified: Secondary | ICD-10-CM

## 2013-06-06 DIAGNOSIS — M25561 Pain in right knee: Secondary | ICD-10-CM

## 2013-06-06 DIAGNOSIS — F3289 Other specified depressive episodes: Secondary | ICD-10-CM

## 2013-06-06 DIAGNOSIS — N920 Excessive and frequent menstruation with regular cycle: Secondary | ICD-10-CM | POA: Insufficient documentation

## 2013-06-06 DIAGNOSIS — L83 Acanthosis nigricans: Secondary | ICD-10-CM

## 2013-06-06 LAB — CBC
HEMATOCRIT: 38.3 % (ref 36.0–46.0)
HEMOGLOBIN: 12.9 g/dL (ref 12.0–15.0)
MCH: 28.3 pg (ref 26.0–34.0)
MCHC: 33.7 g/dL (ref 30.0–36.0)
MCV: 84 fL (ref 78.0–100.0)
Platelets: 384 10*3/uL (ref 150–400)
RBC: 4.56 MIL/uL (ref 3.87–5.11)
RDW: 16 % — ABNORMAL HIGH (ref 11.5–15.5)
WBC: 9.5 10*3/uL (ref 4.0–10.5)

## 2013-06-06 LAB — BASIC METABOLIC PANEL
BUN: 8 mg/dL (ref 6–23)
CO2: 22 mEq/L (ref 19–32)
Calcium: 8.8 mg/dL (ref 8.4–10.5)
Chloride: 100 mEq/L (ref 96–112)
Creatinine, Ser: 0.68 mg/dL (ref 0.50–1.10)
GFR calc non Af Amer: >90 mL/min (ref >90)
Glucose, Bld: 124 mg/dL — ABNORMAL HIGH (ref 70–99)
POTASSIUM: 3.3 meq/L — AB (ref 3.7–5.3)
Sodium: 137 mEq/L (ref 137–147)

## 2013-06-06 LAB — POCT I-STAT TROPONIN I: Troponin i, poc: 0 ng/mL (ref 0.00–0.08)

## 2013-06-06 MED ORDER — IPRATROPIUM BROMIDE 0.02 % IN SOLN
0.5000 mg | Freq: Once | RESPIRATORY_TRACT | Status: AC
  Administered 2013-06-06: 0.5 mg via RESPIRATORY_TRACT
  Filled 2013-06-06: qty 2.5

## 2013-06-06 MED ORDER — LEVALBUTEROL HCL 1.25 MG/0.5ML IN NEBU
1.2500 mg | INHALATION_SOLUTION | RESPIRATORY_TRACT | Status: DC | PRN
  Filled 2013-06-06: qty 0.5

## 2013-06-06 MED ORDER — ALBUTEROL (5 MG/ML) CONTINUOUS INHALATION SOLN
10.0000 mg/h | INHALATION_SOLUTION | Freq: Once | RESPIRATORY_TRACT | Status: AC
  Administered 2013-06-06: 10 mg/h via RESPIRATORY_TRACT
  Filled 2013-06-06: qty 20

## 2013-06-06 MED ORDER — PREDNISONE 50 MG PO TABS
60.0000 mg | ORAL_TABLET | Freq: Every day | ORAL | Status: DC
  Administered 2013-06-07: 60 mg via ORAL
  Filled 2013-06-06 (×3): qty 1

## 2013-06-06 MED ORDER — HYDROCODONE-ACETAMINOPHEN 5-325 MG PO TABS
2.0000 | ORAL_TABLET | Freq: Once | ORAL | Status: AC
  Administered 2013-06-06: 2 via ORAL
  Filled 2013-06-06: qty 2

## 2013-06-06 MED ORDER — HYDROCHLOROTHIAZIDE 12.5 MG PO CAPS
12.5000 mg | ORAL_CAPSULE | Freq: Every day | ORAL | Status: DC
  Administered 2013-06-07: 12.5 mg via ORAL
  Filled 2013-06-06: qty 1

## 2013-06-06 MED ORDER — MOMETASONE FURO-FORMOTEROL FUM 100-5 MCG/ACT IN AERO
2.0000 | INHALATION_SPRAY | Freq: Two times a day (BID) | RESPIRATORY_TRACT | Status: DC
  Administered 2013-06-07: 2 via RESPIRATORY_TRACT
  Filled 2013-06-06: qty 8.8

## 2013-06-06 MED ORDER — LEVALBUTEROL HCL 1.25 MG/0.5ML IN NEBU
1.2500 mg | INHALATION_SOLUTION | RESPIRATORY_TRACT | Status: DC
  Administered 2013-06-07 (×3): 1.25 mg via RESPIRATORY_TRACT
  Filled 2013-06-06 (×9): qty 0.5

## 2013-06-06 MED ORDER — MAGNESIUM SULFATE 40 MG/ML IJ SOLN
2.0000 g | Freq: Once | INTRAMUSCULAR | Status: AC
  Administered 2013-06-06: 2 g via INTRAVENOUS
  Filled 2013-06-06: qty 50

## 2013-06-06 MED ORDER — ACETAMINOPHEN 325 MG PO TABS
650.0000 mg | ORAL_TABLET | Freq: Four times a day (QID) | ORAL | Status: DC | PRN
  Administered 2013-06-06 – 2013-06-07 (×3): 650 mg via ORAL
  Filled 2013-06-06 (×3): qty 2

## 2013-06-06 MED ORDER — ALBUTEROL SULFATE (2.5 MG/3ML) 0.083% IN NEBU
5.0000 mg | INHALATION_SOLUTION | Freq: Once | RESPIRATORY_TRACT | Status: AC
  Administered 2013-06-06: 5 mg via RESPIRATORY_TRACT
  Filled 2013-06-06: qty 6

## 2013-06-06 MED ORDER — SODIUM CHLORIDE 0.9 % IV SOLN
INTRAVENOUS | Status: DC
  Administered 2013-06-07: via INTRAVENOUS

## 2013-06-06 MED ORDER — ALBUTEROL (5 MG/ML) CONTINUOUS INHALATION SOLN
10.0000 mg/h | INHALATION_SOLUTION | Freq: Once | RESPIRATORY_TRACT | Status: AC
  Administered 2013-06-06: 10 mg/h via RESPIRATORY_TRACT

## 2013-06-06 MED ORDER — OXYCODONE HCL 5 MG PO TABS: 5.0000 mg | ORAL_TABLET | Freq: Four times a day (QID) | ORAL | Status: DC | PRN

## 2013-06-06 MED ORDER — OXYCODONE-ACETAMINOPHEN 10-325 MG PO TABS: 1.0000 | ORAL_TABLET | Freq: Four times a day (QID) | ORAL | Status: DC | PRN

## 2013-06-06 MED ORDER — METHYLPREDNISOLONE SODIUM SUCC 125 MG IJ SOLR
125.0000 mg | Freq: Once | INTRAMUSCULAR | Status: AC
  Administered 2013-06-06: 125 mg via INTRAVENOUS
  Filled 2013-06-06: qty 2

## 2013-06-06 MED ORDER — OXYCODONE-ACETAMINOPHEN 5-325 MG PO TABS: 1.0000 | ORAL_TABLET | Freq: Four times a day (QID) | ORAL | Status: DC | PRN

## 2013-06-06 MED ORDER — ACETAMINOPHEN 650 MG RE SUPP: 650.0000 mg | Freq: Four times a day (QID) | RECTAL | Status: DC | PRN

## 2013-06-06 MED ORDER — PANTOPRAZOLE SODIUM 40 MG PO TBEC
40.0000 mg | DELAYED_RELEASE_TABLET | Freq: Every day | ORAL | Status: DC
  Administered 2013-06-07: 40 mg via ORAL
  Filled 2013-06-06: qty 1

## 2013-06-06 MED ORDER — ALBUTEROL SULFATE (2.5 MG/3ML) 0.083% IN NEBU
INHALATION_SOLUTION | RESPIRATORY_TRACT | Status: AC
  Administered 2013-06-06: 2.5 mg
  Filled 2013-06-06: qty 3

## 2013-06-06 MED ORDER — SODIUM CHLORIDE 0.9 % IJ SOLN
3.0000 mL | Freq: Two times a day (BID) | INTRAMUSCULAR | Status: DC
  Administered 2013-06-07: 3 mL via INTRAVENOUS

## 2013-06-06 MED ORDER — ENOXAPARIN SODIUM 40 MG/0.4ML ~~LOC~~ SOLN
40.0000 mg | Freq: Every day | SUBCUTANEOUS | Status: DC
  Administered 2013-06-07: 40 mg via SUBCUTANEOUS
  Filled 2013-06-06: qty 0.4

## 2013-06-06 NOTE — ED Notes (Signed)
Flow called to inquire about bed assignment, no SDU beds available at this time.

## 2013-06-06 NOTE — ED Notes (Signed)
Admitting MD at BS.  

## 2013-06-06 NOTE — Progress Notes (Signed)
Albuterol continuous nebulizer completed and removed from patient. B/S at this time expiratory wheezes. Patient states her chest still feels a little tight,but is better than when she arrived to the emergency department

## 2013-06-06 NOTE — ED Notes (Signed)
Pt placed on 2L 02, pt states she feels SOB, breathing labored. 02 placed for comfort. Pt's 02 sats on RA 99%.

## 2013-06-06 NOTE — ED Notes (Signed)
Pt has history of asthma and is here with shortness of breath, winded with exertion, feels weak.  Pt reports chest pain which is not normal with her asthma

## 2013-06-06 NOTE — ED Notes (Signed)
Pt changed to a SDU bed assingment.

## 2013-06-06 NOTE — ED Notes (Signed)
Contacted RT-on the way to provide [place].

## 2013-06-06 NOTE — H&P (Signed)
Date: 06/06/2013               Patient Name:  April [name] MRN: [id number]  DOB: April 23, 1971 Age / Sex: 43 y.o., female   PCP: Lesly Dukes, MD         Medical Service: Internal Medicine Teaching Service         Attending Physician: Dr. Alcide Evener    First Contact: Dr. Gordy Levan Pager: [phone]  Second Contact: Dr. Alice Rieger Pager: [phone]       After Hours (After 5p/  First Contact Pager: [phone]  weekends / holidays): Second Contact Pager: [phone]   Chief Complaint: SOB, Cough and Fever.  History of Present Illness: April [name], is a 43 year old female with PMH of HTN, Asthma, Obesity, OSA, depression. Presented tday with c/o SOB that started yesterday night, at about 2-3 am. Also noticed cough, that was unproductive. Patinet also noted feeling of being hot and cold with chills, endoresed subjective fevers but did not record her temperature, also with body pains and poor appereite, sore throat, runny nose, and one episode of vomiting this morning.  Hx of sick contacts- Her fiance who had similar symptoms that started the day prior to hers starting, he didn't get the flu shot, but pt did. Noticed some leg swelling today, without pain or redness, no chest pain, uses 2-3 pillow to sleep- unchanged in the past year, no noted weight changes. Pt has an average of 2-3 hospitaizations in a year for acute asthma exacertions but no hx of intubations. Stopped smoking abiut 1-2 years ago, previously smoked 1/2 a pack a day for about 10- years.  Meds:   Current Facility-Administered Medications  Medication Dose Route Frequency Provider Last Rate Last Dose  . acetaminophen (TYLENOL) tablet 650 mg  650 mg Oral Q6H PRN [name], MD   650 mg at 06/06/13 2122   Or  . acetaminophen (TYLENOL) suppository 650 mg  650 mg Rectal Q6H PRN [name], MD       Current Outpatient Prescriptions  Medication Sig Dispense Refill  . albuterol (PROVENTIL) (2.5 MG/3ML) 0.083% nebulizer solution Take 3 mLs  (2.5 mg total) by nebulization every 4 (four) hours as needed for wheezing or shortness of breath.  30 vial  3  . hydrochlorothiazide (MICROZIDE) 12.5 MG capsule Take 1 capsule (12.5 mg total) by mouth daily.  30 capsule  11  . ipratropium (ATROVENT) 0.02 % nebulizer solution Take 2.5 mLs (0.5 mg total) by nebulization every 4 (four) hours as needed for wheezing or shortness of breath.  75 mL  2  . mometasone-formoterol (DULERA) 100-5 MCG/ACT AERO Inhale 2 puffs into the lungs 2 (two) times daily.      [name] omalizumab (XOLAIR) 150 MG injection Inject 225 mg into the skin every 14 (fourteen) days. Had inj last 04-27-13      . omeprazole (PRILOSEC) 20 MG capsule Take 20 mg by mouth daily as needed (for heartburn).       [name] oxyCODONE-acetaminophen (PERCOCET) 10-325 MG per tablet Take 1 tablet by mouth every 6 (six) hours as needed for pain. For back pain  30 tablet  0  . predniSONE (DELTASONE) 20 MG tablet 3 tabs po daily x 3 days, then 2 tabs x 3 days, then 1.5 tabs x 3 days, then 1 tab x 3 days, then 0.5 tabs x 3 days  27 tablet  0  . vitamin B-12 (CYANOCOBALAMIN) 1000 MCG tablet Take 1,000 mcg  by mouth daily.        Allergies: Allergies as of 06/06/2013 - Review Complete 06/06/2013  Allergen Reaction Noted  . Ace inhibitors Other (See Comments) 11/24/2009  . Percocet [oxycodone-acetaminophen] Itching 01/13/2012   Past Medical History  Diagnosis Date  . Acanthosis nigricans   . Menorrhagia   . Hypertension, essential   . Insomnia   . Morbid obesity   . Helicobacter pylori (H. pylori) infection   . Sleep apnea     Sleep study 2008 - mild OSA, not enough events to titrate CPAP  . COPD (chronic obstructive pulmonary disease)     PFTs in 2002, FEV1/FVC 65, no post bronchodilater test done  . Asthma     Followed by Dr. Annamaria Boots (pulmonology); receives every other week omalizumab injections; has frequent exacerbations  . Depression   . GERD (gastroesophageal reflux disease)   . Tobacco user   .  Obesity    Past Surgical History  Procedure Laterality Date  . Tubal ligation  1996    bilateral  . Breast reduction surgery  09/2011   Family History  Problem Relation Age of Onset  . Asthma Daughter   . Cancer Paternal Aunt   . Asthma Maternal Grandmother   . Hypertension Mother    History   Social History  . Marital Status: Single    Spouse Name: N/A    Number of Children: 1  . 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 Smoking status: Former Smoker -- 0.25 packs/day for 9 years    Types: Cigarettes    Quit date: 10/04/2012  . Smokeless tobacco: Never Used  . Alcohol Use: No  . Drug Use: No  . Sexual Activity: Yes   Other Topics Concern  . 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     Review of Systems: SKIN- No Rash, colour changes, or itching.[name] CARDIAC- No Palpitations, DOE, PND, chest pain. GI- No diarrhoea, or abd pain, URINARY- No Frequency or dysuria NEUROLOGIC- No Numbness, or syncope.  Physical Exam: Blood pressure 118/76, pulse 126, temperature 97.8 F (36.6 C), temperature source Axillary, resp. rate 21, last menstrual period 05/03/2013, SpO2 100.00%. GENERAL- alert, co-operative, appears as stated age, in mild respiratory distress, using breathing treatments- which she reported was not signif helping with her SOB. HEENT- Atraumatic, normocephalic, PERRL, EOMI, oral mucosa appears moist. CARDIAC- Tachycardic, no murmurs, rubs or gallops. RESP- Moving equal volumes of air, no wheezes posteriorly- ?pts body habitus, breath sounds heard, but some expiratory wheezes anteriorly, no crackles. ABDOMEN- Soft, non tender, no palpable masses or organomegaly, bowel sounds present. NEURO- No obvious Cr N deficit, strenght equal and present in all  extremities. EXTREMITIES- pulse 2+, symmetric, no pedal edema. SKIN- Warm, dry, No rash or lesion. PSYCH- Normal mood and affect, appropriate thought content and speech.  Lab results: Basic Metabolic Panel:  Recent Labs  06/06/13 1405  NA 137  K 3.3*  CL 100  CO2 22  GLUCOSE 124*  BUN 8  CREATININE 0.68  CALCIUM 8.8   Liver Function Tests: No results found for this basename: AST, ALT, ALKPHOS, BILITOT, PROT, ALBUMIN,  in the last 72 hours No results found for this basename: LIPASE, AMYLASE,  in the last 72 hours No results found for this  basename: AMMONIA,  in the last 72 hours CBC:  Recent Labs  06/06/13 1405  WBC 9.5  HGB 12.9  HCT 38.3  MCV 84.0  PLT 384   Cardiac Enzymes: No results found for this basename: CKTOTAL, CKMB, CKMBINDEX, TROPONINI,  in the last 72 hours BNP: No results found for this basename: PROBNP,  in the last 72 hours D-Dimer: No results found for this basename: DDIMER,  in the last 72 hours CBG: No results found for this basename: GLUCAP,  in the last 72 hours Hemoglobin A1C: No results found for this basename: HGBA1C,  in the last 72 hours Fasting Lipid Panel: No results found for this basename: CHOL, HDL, LDLCALC, TRIG, CHOLHDL, LDLDIRECT,  in the last 72 hours Thyroid Function Tests: No results found for this basename: TSH, T4TOTAL, FREET4, T3FREE, THYROIDAB,  in the last 72 hours Anemia Panel: No results found for this basename: VITAMINB12, FOLATE, FERRITIN, TIBC, IRON, RETICCTPCT,  in the last 72 hours Coagulation: No results found for this basename: LABPROT, INR,  in the last 72 hours Urine Drug Screen: Drugs of Abuse     Component Value Date/Time   LABOPIA NONE DETECTED 03/04/2010 1242   COCAINSCRNUR NONE DETECTED 03/04/2010 1242   LABBENZ NONE DETECTED 03/04/2010 1242   AMPHETMU NONE DETECTED 03/04/2010 1242   THCU NONE DETECTED 03/04/2010 1242   LABBARB  Value: NONE DETECTED        DRUG SCREEN FOR MEDICAL PURPOSES ONLY.  IF  CONFIRMATION IS NEEDED FOR ANY PURPOSE, NOTIFY LAB WITHIN 5 DAYS.        LOWEST DETECTABLE LIMITS FOR URINE DRUG SCREEN Drug Class       Cutoff (ng/mL) Amphetamine      1000 Barbiturate      200 Benzodiazepine   200 Tricyclics       300 Opiates          300 Cocaine          300 THC              50 03/04/2010 1242    Alcohol Level: No results found for this basename: ETH,  in the last 72 hours Urinalysis: No results found for this basename: COLORURINE, APPERANCEUR, LABSPEC, PHURINE, GLUCOSEU, HGBUR, BILIRUBINUR, KETONESUR, PROTEINUR, UROBILINOGEN, NITRITE, LEUKOCYTESUR,  in the last 72 hours   Imaging results:  Dg Chest 2 View (if Patient Has Fever And/or Copd)  06/06/2013   CLINICAL DATA:  Chest congestion with cough and shortness of breath for 3 days.  EXAM: CHEST  2 VIEW  COMPARISON:  DG CHEST 2 VIEW dated 05/03/2013  FINDINGS: Midline trachea. Normal heart size and mediastinal contours. No pleural effusion or pneumothorax. Diffuse peribronchial thickening. No lobar consolidation.  IMPRESSION: 1.  No acute cardiopulmonary disease. 2. Mild peribronchial thickening which may relate to chronic bronchitis or smoking.   Electronically Signed   By: Jeronimo Greaves M.D.   On: 06/06/2013 15:03    Other results: EKG: rate- 103bpm, no findings suggestive of ischemia, no signif change from Prior.  Assessment & Plan by Problem: Principal Problem:   Acute asthma exacerbation Active Problems:   HYPERTENSION, ESSENTIAL NOS   Obstructive sleep apnea  Acute Asthma Exacerbation- presenting with SOB and cough, tachypneic and tachycardic, claims compliance with Steroid inhalers and rescue inhalers- Albuterol and Ipratropium and dulera. Likely provoked by viral infection, considering symptoms of Chills, sorethroat, sick contacts, and Myalgias. No documented fevers on admission, CBC- WBC- 9.5, Chest Xray not suggestive of infection. Peak flow  readings- 200, 230, 150. Pts baseline- 350, but for her age- 39. - Admit to  step down - IVF N/s 45mls/hr - Flu panel - Levabuterol- Q4H, Q2H PRN - Continue dulera inhaler- BID - Received 125mg  of solumedrol in the ED, cont Prednisone- 60mg  daily, and taper - Can follow Peak flows to access response.  Hypokalemia- K- 3.3. Got one dose of 36meq KCL in the Ed. - Rplete - Bmet in the Am.  HTN- Mostly stable since admission. Home meds- HCTZ- 12.5mg  daily. - Continue on admission.  Hyperglycemia- CBGs- 277, likely steroid response. Previous blood glucose on BMet- 124. - HBA1c in the Am - SSI-s  OSA- Placed on CPAP.  DVT ppx- Enoxaparin  Dispo: Disposition is deferred at this time, awaiting improvement of current medical problems. Anticipated discharge in approximately 2-3 day(s).   The patient does have a current PCP (Lesly Dukes, MD) and does need an [facility] hospital follow-up appointment after discharge.  The patient does not know have transportation limitations that hinder transportation to clinic appointments.  Signed: Jenetta Downer, MD 06/06/2013, 9:30 PM

## 2013-06-06 NOTE — ED Provider Notes (Signed)
CSN: [id number]     Arrival date & time 06/06/13  1335 History   First MD Initiated Contact with Patient 06/06/13 1455     Chief Complaint  Patient presents with  . Shortness of Breath   (Consider location/radiation/quality/duration/timing/severity/associated sxs/prior Treatment) Patient is a 43 y.o. female presenting with shortness of breath. The history is provided by the patient.  Shortness of Breath Severity:  Moderate Onset quality:  Gradual Duration:  2 days Timing:  Constant Chronicity:  Recurrent Context: URI   Relieved by:  Nothing Worsened by:  Nothing tried Ineffective treatments:  Inhaler Associated symptoms: chest pain (achy, central, constant all day) and cough (dry)   Associated symptoms: no abdominal pain, no fever and no vomiting     Past Medical History  Diagnosis Date  . Acanthosis nigricans   . Menorrhagia   . Hypertension, essential   . Insomnia   . Morbid obesity   . Helicobacter pylori (H. pylori) infection   . Sleep apnea     Sleep study 2008 - mild OSA, not enough events to titrate CPAP  . COPD (chronic obstructive pulmonary disease)     PFTs in 2002, FEV1/FVC 65, no post bronchodilater test done  . Asthma     Followed by Dr. Annamaria Boots (pulmonology); receives every other week omalizumab injections; has frequent exacerbations  . Depression   . GERD (gastroesophageal reflux disease)   . Tobacco user   . Obesity    Past Surgical History  Procedure Laterality Date  . Tubal ligation  1996    bilateral  . Breast reduction surgery  09/2011   Family History  Problem Relation Age of Onset  . Asthma Daughter   . Cancer Paternal Aunt   . Asthma Maternal Grandmother   . Hypertension Mother    History  Substance Use Topics  . Smoking status: Former Smoker -- 0.25 packs/day for 9 years    Types: Cigarettes    Quit date: 10/04/2012  . Smokeless tobacco: Never Used  . Alcohol Use: No   OB History   Grav Para Term Preterm Abortions TAB SAB Ect Mult  Living                 Review of Systems  Constitutional: Negative for fever and chills.  HENT: Positive for congestion and rhinorrhea.   Respiratory: Positive for cough (dry) and shortness of breath.   Cardiovascular: Positive for chest pain (achy, central, constant all day).  Gastrointestinal: Positive for nausea. Negative for vomiting and abdominal pain.  All other systems reviewed and are negative.    Allergies  Ace inhibitors and Percocet  Home Medications   Current Outpatient Rx  Name  Route  Sig  Dispense  Refill  . albuterol (PROVENTIL) (2.5 MG/3ML) 0.083% nebulizer solution   Nebulization   Take 3 mLs (2.5 mg total) by nebulization every 4 (four) hours as needed for wheezing or shortness of breath.   30 vial   3   . hydrochlorothiazide (MICROZIDE) 12.5 MG capsule   Oral   Take 1 capsule (12.5 mg total) by mouth daily.   30 capsule   11   . ipratropium (ATROVENT) 0.02 % nebulizer solution   Nebulization   Take 2.5 mLs (0.5 mg total) by nebulization every 4 (four) hours as needed for wheezing or shortness of breath.   75 mL   2   . mometasone-formoterol (DULERA) 100-5 MCG/ACT AERO   Inhalation   Inhale 2 puffs into the lungs 2 (two) times daily.         [name]  omalizumab (XOLAIR) 150 MG injection   Subcutaneous   Inject 225 mg into the skin every 14 (fourteen) days. Had inj last 04-27-13         . omeprazole (PRILOSEC) 20 MG capsule   Oral   Take 20 mg by mouth daily as needed (for heartburn).          [name] oxyCODONE-acetaminophen (PERCOCET) 10-325 MG per tablet   Oral   Take 1 tablet by mouth every 6 (six) hours as needed for pain. For back pain   30 tablet   0   . predniSONE (DELTASONE) 20 MG tablet      3 tabs po daily x 3 days, then 2 tabs x 3 days, then 1.5 tabs x 3 days, then 1 tab x 3 days, then 0.5 tabs x 3 days   27 tablet   0   . vitamin B-12 (CYANOCOBALAMIN) 1000 MCG tablet   Oral   Take 1,000 mcg by mouth daily.          BP 172/94   Pulse 115  Temp(Src) 98.6 F (37 C) (Oral)  Resp 24  SpO2 97%  LMP 05/03/2013 Physical Exam  Nursing note and vitals reviewed. Constitutional: She is oriented to person, place, and time. She appears well-developed and well-nourished. No distress.  HENT:  Head: Normocephalic and atraumatic.  Eyes: EOM are normal. Pupils are equal, round, and reactive to light.  Neck: Normal range of motion. Neck supple.  Cardiovascular: Normal rate and regular rhythm.  Exam reveals no friction rub.   No murmur heard. Pulmonary/Chest: No accessory muscle usage. Not tachypneic and not bradypneic. No respiratory distress. She has decreased breath sounds (mild, diffuse). She has wheezes (mild, diffuse). She has no rales.  Talking in complete sentences  Abdominal: Soft. She exhibits no distension. There is no tenderness. There is no rebound.  Musculoskeletal: Normal range of motion. She exhibits no edema.  Neurological: She is alert and oriented to person, place, and time.  Skin: She is not diaphoretic.    ED Course  Procedures (including critical care time) Labs Review Labs Reviewed  CBC - Abnormal; Notable for the following:    RDW 16.0 (*)    All other components within normal limits  BASIC METABOLIC PANEL  POCT I-STAT TROPONIN I   Imaging Review Dg Chest 2 View (if Patient Has Fever And/or Copd)  06/06/2013   CLINICAL DATA:  Chest congestion with cough and shortness of breath for 3 days.  EXAM: CHEST  2 VIEW  COMPARISON:  DG CHEST 2 VIEW dated 05/03/2013  FINDINGS: Midline trachea. Normal heart size and mediastinal contours. No pleural effusion or pneumothorax. Diffuse peribronchial thickening. No lobar consolidation.  IMPRESSION: 1.  No acute cardiopulmonary disease. 2. Mild peribronchial thickening which may relate to chronic bronchitis or smoking.   Electronically Signed   By: Abigail Miyamoto M.D.   On: 06/06/2013 15:03    EKG Interpretation    Date/Time:  Sunday June 06 2013 14:05:04  EST Ventricular Rate:  103 PR Interval:  144 QRS Duration: 86 QT Interval:  350 QTC Calculation: 458 R Axis:   60 Text Interpretation:  Sinus tachycardia ST \\T \ T wave abnormality, consider inferolateral ischemia T wave abnormalities similar to prior Abnormal ECG Confirmed by Mingo Amber  MD, [place] ([id number]) on 06/06/2013 8:07:00 PM           CRITICAL CARE Performed by: Osvaldo Shipper   Total critical care time: 30 minutes  Critical care time was exclusive  of separately billable procedures and treating other patients.  Critical care was necessary to treat or prevent imminent or life-threatening deterioration.  Critical care was time spent personally by me on the following activities: development of treatment plan with patient and/or surrogate as well as nursing, discussions with consultants, evaluation of patient's response to treatment, examination of patient, obtaining history from patient or surrogate, ordering and performing treatments and interventions, ordering and review of laboratory studies, ordering and review of radiographic studies, pulse oximetry and re-evaluation of patient's condition.  MDM   1. Asthma exacerbation    32F here with chronic asthma presents with acute exacerbation. Recent URI likely trigger. Complaining of achy chest pain, similar to prior asthma exacerbations. CXR with bronchitic changes, no focal pneumonia. Diffuse wheezing on exam, no respiratory distress. Will give albuterol, steroids. Asking for solumedrol as it helped previously. Solumedrol given. After 1st breathing treatment, opening up, sounding better, still SOB. Another hour of breathing treatments given.  On re-exam after 2nd breathing treatment  (2nd hour), states no improvement. Lungs sounding worse. 3rd hour ordered, magnesium ordered. Admitted to Medicine.    Osvaldo Shipper, MD 06/06/13 2018

## 2013-06-06 NOTE — ED Notes (Signed)
Attempted IV x 2-- pt requested IV therapy be called-- IV team paged

## 2013-06-07 ENCOUNTER — Encounter: Payer: Self-pay | Admitting: Internal Medicine

## 2013-06-07 LAB — GLUCOSE, CAPILLARY
Glucose-Capillary: 277 mg/dL — ABNORMAL HIGH (ref 70–99)
Glucose-Capillary: 167 mg/dL — ABNORMAL HIGH (ref 70–99)
Glucose-Capillary: 135 mg/dL — ABNORMAL HIGH (ref 70–99)

## 2013-06-07 LAB — MRSA PCR SCREENING: MRSA by PCR: NEGATIVE

## 2013-06-07 IMAGING — CR DG CHEST 2V
2 series · 2 of 2 positions shown · non-contrast
Comparison: 05/20/2012

CLINICAL DATA: Shortness of breath, asthma

CHEST - 2 VIEW

[w chest pa]
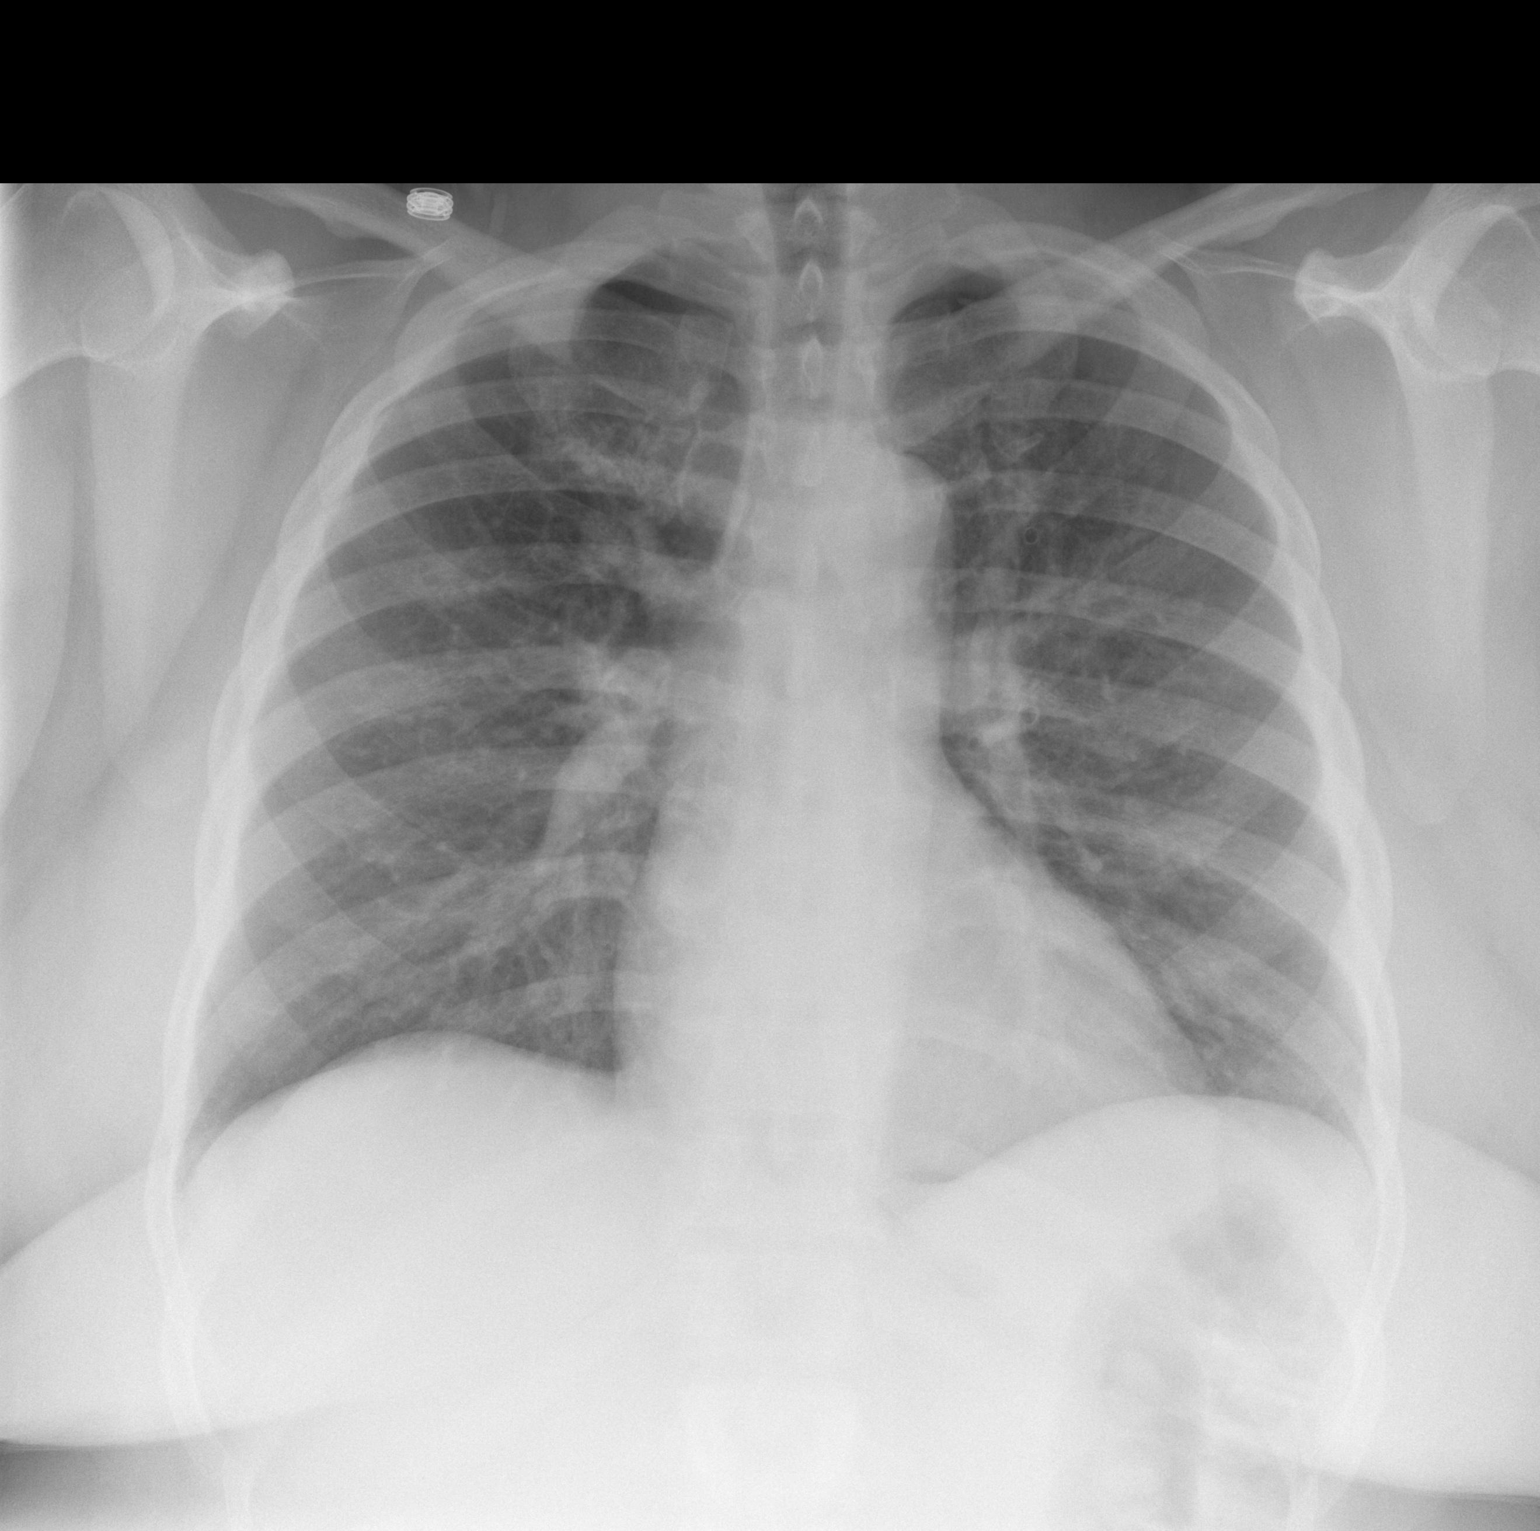

[w chest lat]
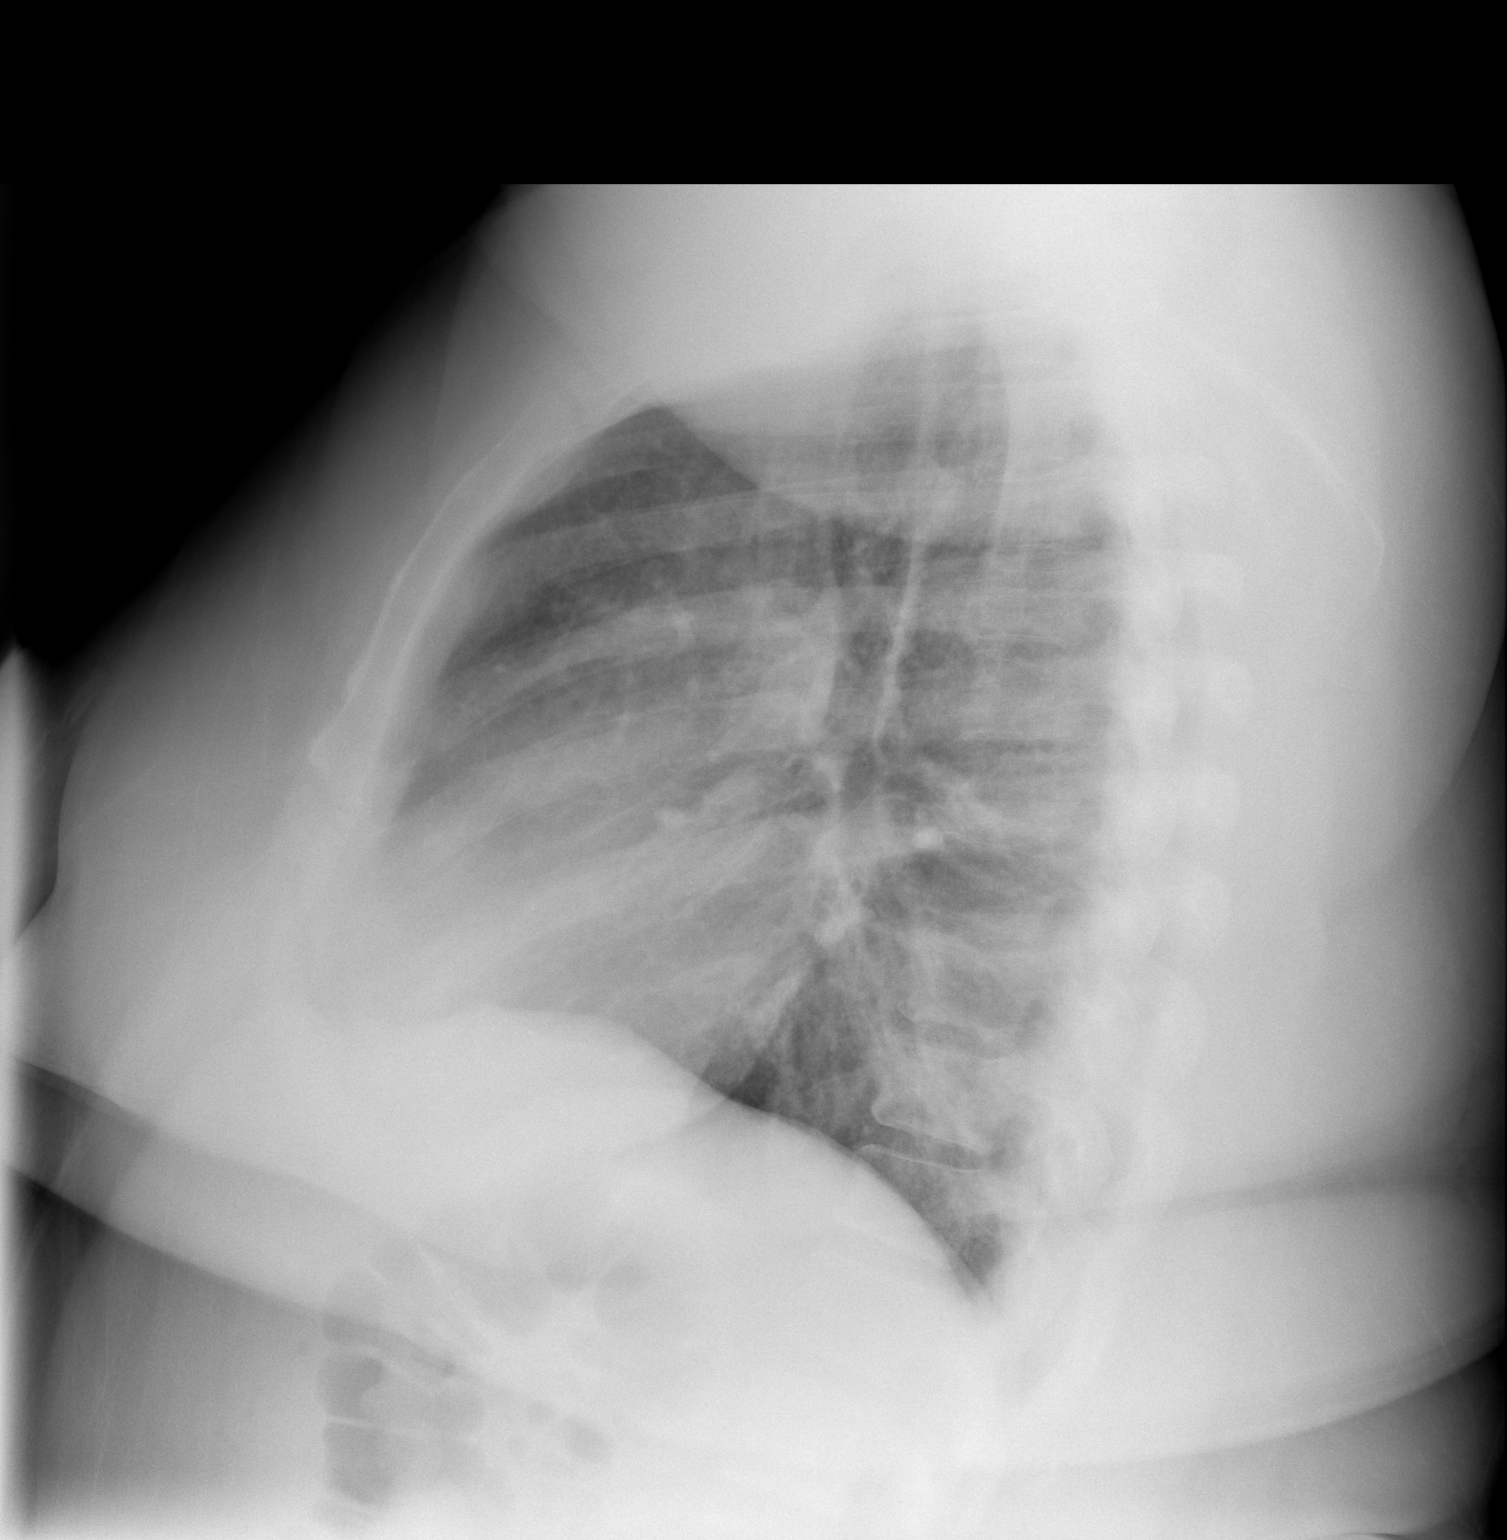

[2 of 2 positions shown; findings below may reference images not displayed]

FINDINGS: Cardiomediastinal silhouette is stable.  No acute
infiltrate or pleural effusion.  No pulmonary edema.  Mild
perihilar increased bronchial markings without focal consolidation.
IMPRESSION: .  No acute infiltrate or pulmonary edema.  Mild perihilar
increased bronchial markings without focal consolidation.

## 2013-06-07 MED ORDER — OSELTAMIVIR PHOSPHATE 75 MG PO CAPS: 75.0000 mg | ORAL_CAPSULE | Freq: Two times a day (BID) | ORAL | Status: DC

## 2013-06-07 MED ORDER — PREDNISONE 20 MG PO TABS: 60.0000 mg | ORAL_TABLET | Freq: Every day | ORAL | Status: DC

## 2013-06-07 MED ORDER — IPRATROPIUM BROMIDE 0.02 % IN SOLN
0.5000 mg | RESPIRATORY_TRACT | Status: DC
  Administered 2013-06-07: 0.5 mg via RESPIRATORY_TRACT
  Filled 2013-06-07: qty 2.5

## 2013-06-07 MED ORDER — LEVALBUTEROL HCL 1.25 MG/0.5ML IN NEBU
1.2500 mg | INHALATION_SOLUTION | Freq: Four times a day (QID) | RESPIRATORY_TRACT | Status: DC
  Filled 2013-06-07 (×4): qty 0.5

## 2013-06-07 MED ORDER — ALBUTEROL SULFATE HFA 108 (90 BASE) MCG/ACT IN AERS
2.0000 | INHALATION_SPRAY | Freq: Once | RESPIRATORY_TRACT | Status: AC
  Administered 2013-06-07: 2 via RESPIRATORY_TRACT
  Filled 2013-06-07: qty 6.7

## 2013-06-07 MED ORDER — ZOLPIDEM TARTRATE 5 MG PO TABS
5.0000 mg | ORAL_TABLET | Freq: Once | ORAL | Status: AC
  Administered 2013-06-07: 5 mg via ORAL
  Filled 2013-06-07: qty 1

## 2013-06-07 MED ORDER — INSULIN ASPART 100 UNIT/ML ~~LOC~~ SOLN: 0.0000 [IU] | Freq: Every day | SUBCUTANEOUS | Status: DC

## 2013-06-07 MED ORDER — POTASSIUM CHLORIDE CRYS ER 20 MEQ PO TBCR
40.0000 meq | EXTENDED_RELEASE_TABLET | Freq: Once | ORAL | Status: AC
  Administered 2013-06-07: 40 meq via ORAL

## 2013-06-07 MED ORDER — PREDNISONE 20 MG PO TABS: 40.0000 mg | ORAL_TABLET | Freq: Every day | ORAL | Status: DC

## 2013-06-07 MED ORDER — IPRATROPIUM BROMIDE 0.02 % IN SOLN: 0.5000 mg | Freq: Four times a day (QID) | RESPIRATORY_TRACT | Status: DC

## 2013-06-07 MED ORDER — ALBUTEROL SULFATE HFA 108 (90 BASE) MCG/ACT IN AERS: 2.0000 | INHALATION_SPRAY | RESPIRATORY_TRACT | Status: DC | PRN

## 2013-06-07 MED ORDER — INSULIN ASPART 100 UNIT/ML ~~LOC~~ SOLN: 4.0000 [IU] | Freq: Once | SUBCUTANEOUS | Status: DC

## 2013-06-07 MED ORDER — INSULIN ASPART 100 UNIT/ML ~~LOC~~ SOLN
0.0000 [IU] | Freq: Three times a day (TID) | SUBCUTANEOUS | Status: DC
  Administered 2013-06-07: 1 [IU] via SUBCUTANEOUS
  Administered 2013-06-07: 2 [IU] via SUBCUTANEOUS

## 2013-06-07 NOTE — H&P (Signed)
  Date: 06/07/2013  Patient name: AVILENE MARRIN  Medical record number: [id number]  Date of birth: 1970/05/24   I have seen and evaluated [name] and discussed their care with the Residency Team.   Assessment and Plan: I have seen and evaluated the patient as outlined above. I agree with the formulated Assessment and Plan as detailed in the residents' admission note, with the following changes:   Patient with severe asthma exacerbation requiring IV Solu-Medrol bronchodilators and initial admission to the step down unit.  She had been combative with staff and uncooperative she refused phlebotomy sticks she claimed to me that she had a flu PCR obtained I initially thought this was the case but the only clue PCR result see [place] in 2014.  She is adamant that she go home today. On exam she does continue to have some wheezes present with the bases but she appears more comfortable and was described last night.  Because the patient is insistent on going home all not force her to leave [facility]. I told her that when the person comes in the hospital with severe asthma requiring hospitalization requiring IVC are [facility] and is trip to the step down unit that I am generally not at all comfortable with discharging the patient the following day the prefer to treat the patient as an inpatient for several days with IV corticosteroids and bronchodilators.  Because she is insistent on going home I recommend placing her on prednisone 60 mg for one week.  Because we have not been able to exclude influenza because the patient does not appear to undergone influenza PCR testing I recommend giving her Tamiflu 75 mg twice daily for 5 days.  Does need repeat HIV testing in future but again has refused blood draws please do this at next time she comes in to inpatient counter where she has phlebotomy.  Truman Hayward, [place] 2/9/201512:42 PM

## 2013-06-07 NOTE — Progress Notes (Addendum)
Pt transferred from 2100. Initially she was hostile but became very cooperative. Insisted that IV be d/c. Refused telemetry monitoring. Discussed at length about her elevated blood sugars and her refusal to take insulin on 2100. Pt agreed to take insulin when cbg tested this a.m. Dr.Emokpae notified about pt status and no telemetry monitoring.

## 2013-06-07 NOTE — Care Management Note (Signed)
CARE MANAGEMENT NOTE 06/07/2013  Patient:  April [name], April [name]   Account Number:  [id number]  Date Initiated:  06/07/2013  Documentation initiated by:  Ricki Miller  Subjective/Objective Assessment:   43 yr old female admitted for asthma exacerbation.     Action/Plan:   CM received consult to assist pateint with getting inhalers. Patient has insurance, not eligible for med assist.   Anticipated DC Date:  06/07/2013   Anticipated DC Plan:  [place]  CM consult      PAC Choice  NA   Choice offered to / List presented to:          [facility] arranged  NA      Status of service:  Completed, signed off Medicare Important Message given?   (If response is "NO", the following Medicare IM given date fields will be blank) Date Medicare IM given:   Date Additional Medicare IM given:    Discharge Disposition:  HOME/SELF CARE

## 2013-06-07 NOTE — Discharge Instructions (Signed)
Please return to the internal medicine clinic for your appointment with Dr. Lucila Maine.    Should you have any new or worsening symptoms, please be sure to call the clinic at [phone].  If you believe that you are suffering from a life threatening condition or one that may result in the loss of limb or function, then you should call 911 or proceed to the nearest Emergency Department.

## 2013-06-07 NOTE — Progress Notes (Signed)
UR completed 

## 2013-06-07 NOTE — Progress Notes (Signed)
Patient provided with discharge instructions and follow up appointment information. She is going home at this time with spouse.

## 2013-06-07 NOTE — Progress Notes (Signed)
Subjective:    Pt reports her breathing is better this AM and is feeling better.  She wants to be discharged today because she has to go to work tomorrow.  I took her oxygen off and she was saturating at 100%RA.     Objective:   Vital signs in last 24 hours: Filed Vitals:   06/07/13 0400 06/07/13 0408 06/07/13 0511 06/07/13 0900  BP: 123/74  129/63   Pulse: 110  109   Temp:  98.9 F (37.2 C) 98.3 F (36.8 C)   TempSrc:  Oral Oral   Resp: 20  20   Height:      Weight:      SpO2: 100% 98% 98% 97%   Weight change:  No intake or output data in the 24 hours ending 06/08/13 2053  Physical Exam: Constitutional: Vital signs reviewed.  Patient is in no acute distress and cooperative with exam.   Head: Normocephalic and atraumatic Eyes: PERRL, EOMI, conjunctivae normal, no scleral icterus  Neck: Supple, Trachea midline Cardiovascular: RRR, distant heart sounds, no MRG, DP 2+ b/l Pulmonary/Chest: normal respiratory effort, CTAB, mild wheezes, rales, or rhonchi Abdominal: Obese. Soft. +BS, Non-tender, non-distended Musculoskeletal: No joint deformities, erythema, or stiffness Neurological: A&O x3, cranial nerve II-XII are grossly intact, moving all extremities  Skin: Warm, dry and intact. No rash Psychiatric: Normal mood and affect.   Lab Results:  BMP:  Recent Labs Lab 06/06/13 1405  NA 137  K 3.3*  CL 100  CO2 22  GLUCOSE 124*  BUN 8  CREATININE 0.68  CALCIUM 8.8    CBC:  Recent Labs Lab 06/06/13 1405  WBC 9.5  HGB 12.9  HCT 38.3  MCV 84.0  PLT 384    Coagulation: No results found for this basename: LABPROT, INR,  in the last 168 hours  CBG:            Recent Labs Lab 06/06/13 2326 06/07/13 0702 06/07/13 1124  GLUCAP 277* 167* 135*           HA1C:      No results found for this basename: HGBA1C,  in the last 168 hours  Lipid Panel: No results found for this basename: CHOL, HDL, LDLCALC, TRIG, CHOLHDL, LDLDIRECT,  in the last 168  hours  LFTs: No results found for this basename: AST, ALT, ALKPHOS, BILITOT, PROT, ALBUMIN,  in the last 168 hours  Pancreatic Enzymes: No results found for this basename: LIPASE, AMYLASE,  in the last 168 hours  Ammonia: No results found for this basename: AMMONIA,  in the last 168 hours  Cardiac Enzymes: No results found for this basename: CKTOTAL, CKMB, CKMBINDEX, TROPONINI,  in the last 168 hours Lab Results  Component Value Date   CKTOTAL 234* 02/28/2010   CKMB 2.0 02/28/2010   TROPONINI <0.30 10/07/2012    EKG:  Date/Time:  Sunday June 06 2013 14:05:04 EST Ventricular Rate:  103 PR Interval:  144 QRS Duration: 86 QT Interval:  350 QTC Calculation: 458 R Axis:   60 Text Interpretation:  Sinus tachycardia ST \\T \ T wave abnormality, consider inferolateral ischemia T wave abnormalities similar to prior Abnormal ECG Confirmed by Mingo Amber  MD, BLAIR ([id number]) on 06/06/2013 8:07:00 PM  BNP: No results found for this basename: PROBNP,  in the last 168 hours  D-Dimer: No results found for this basename: DDIMER,  in the last 168 hours  Urinalysis: No results found for this basename: COLORURINE, APPERANCEUR, LABSPEC, [place], GLUCOSEU, [place], [place], [place], [place],  UROBILINOGEN, NITRITE, LEUKOCYTESUR,  in the last 168 hours  Micro Results: Recent Results (from the past 240 hour(s))  MRSA PCR SCREENING     Status: None   Collection Time    06/06/13 11:43 PM      Result Value Range Status   MRSA by PCR NEGATIVE  NEGATIVE Final   Comment:            The GeneXpert MRSA Assay (FDA     approved for NASAL specimens     only), is one component of a     comprehensive MRSA colonization     surveillance program. It is not     intended to diagnose MRSA     infection nor to guide or     monitor treatment for     MRSA infections.    Blood Culture:    Component Value Date/Time   SDES ABSCESS 01/24/2013 1705   SPECREQUEST RIGHT THIRD DIGIT PARONYCHIA PT ON CLINDAMYCIN  01/24/2013 1705   CULT  Value: NO GROWTH 3 DAYS Performed at [facility] 01/24/2013 1705   REPTSTATUS 01/27/2013 FINAL 01/24/2013 1705    Studies/Results: No results found.  Medications:  Scheduled Meds:  Continuous Infusions: PRN Meds:.    Antibiotics: Anti-infectives   Start     Dose/Rate Route Frequency Ordered Stop   06/07/13 0000  oseltamivir (TAMIFLU) 75 MG capsule     75 mg Oral 2 times daily 06/07/13 1524       Antibiotics Given (last 72 hours)   None      Day of Hospitalization:  1 Consults:    Assessment/Plan:   Principal Problem:   Acute asthma exacerbation Active Problems:   HYPERTENSION, ESSENTIAL NOS   Obstructive sleep apnea  #Acute Asthma Exacerbation- Pt presenting with SOB and cough, tachypnea, and tachycardia.  She claims compliance with steroid inhalers and rescue inhalers- albuterol and Ipratropium and dulera. Likely provoked by viral infection, considering symptoms of chills, sore throat, sick contacts, and myalgias. No documented fevers on admission, CBC- WBC- 9.5, Chest Xray not suggestive of infection. Peak flow readings-200, 230, 150. Pts baseline- 350, but for her age- 41.  She was initially admitted to step down but transferred to regular floor. She was given IVF NS 14ml/hr and duonebs.  She was given 125mg  solumedrol in the ED.  In the AM, she was feeling much better and her O2 sat was 100% on RA and wanted to be discharged.  She was discharged home on prednisone.   Hypokalemia- K- 3.3. Got one dose of 27meq KCL in the ED.  Most likely due to albuterol treatment.    HTN- Stable. Home meds- HCTZ- 12.5mg  daily.  Continued on admission.    Hyperglycemia- CBGs- 277, likely steroid response. Previous blood glucose on BMet- 124.  She was started on SSI-s. HA1c was canceled.   OSA- Placed on CPAP.   DVT ppx- Enoxaparin  #Dispo- Pt has improved significantly and will be discharged home today with prednisone.     LOS: 1 day   Michail Jewels, MD PGY-1, Internal Medicine  [phone] (7AM-5PM Mon-Fri) 06/08/2013, 8:53 PM

## 2013-06-08 NOTE — Discharge Summary (Signed)
Name: April [name] 43 y.o. PCP: Lesly Dukes, MD . Date of Admission: 06/06/2013  2:55 PM Date of Discharge: 06/08/2013 Attending Physician: Dr. Tommy Medal  Discharge Diagnosis: Principal Problem:   Acute asthma exacerbation Active Problems:   HYPERTENSION, ESSENTIAL NOS   Obstructive sleep apnea  Discharge Medications:   Medication List         albuterol (2.5 MG/3ML) 0.083% nebulizer solution  Commonly known as:  PROVENTIL  Take 3 mLs (2.5 mg total) by nebulization every 4 (four) hours as needed for wheezing or shortness of breath.     albuterol 108 (90 BASE) MCG/ACT inhaler  Commonly known as:  PROVENTIL HFA;VENTOLIN HFA  Inhale 2 puffs into the lungs every 4 (four) hours as needed for wheezing or shortness of breath.     hydrochlorothiazide 12.5 MG capsule  Commonly known as:  MICROZIDE  Take 1 capsule (12.5 mg total) by mouth daily.     ipratropium 0.02 % nebulizer solution  Commonly known as:  ATROVENT  Take 2.5 mLs (0.5 mg total) by nebulization every 4 (four) hours as needed for wheezing or shortness of breath.     mometasone-formoterol 100-5 MCG/ACT Aero  Commonly known as:  DULERA  Inhale 2 puffs into the lungs 2 (two) times daily.     omalizumab 150 MG injection  Commonly known as:  XOLAIR  Inject 225 mg into the skin every 14 (fourteen) days. Had inj last 04-27-13     omeprazole 20 MG capsule  Commonly known as:  PRILOSEC  Take 20 mg by mouth daily as needed (for heartburn).     oseltamivir 75 MG capsule  Commonly known as:  TAMIFLU  Take 1 capsule (75 mg total) by mouth 2 (two) times daily.     oxyCODONE-acetaminophen 10-325 MG per tablet  Commonly known as:  PERCOCET  Take 1 tablet by mouth every 6 (six) hours as needed for pain. For back pain     predniSONE 20 MG tablet  Commonly known as:  DELTASONE  Take 3 tablets (60 mg total) by mouth daily with breakfast.     vitamin B-12 1000 MCG tablet  Commonly known as:   CYANOCOBALAMIN  Take 1,000 mcg by mouth daily.        Disposition and follow-up:   April [name] was discharged from [facility] in [place] condition.  Please address the following problems:  1.  Compliance with asthma medications, shortness of breath.  She stated she was unable to afford her medications and was sent home with her hospital issued inhalers.  I contacted case management but they were unable to provide her with assistance because she has insurance.  Please see if their are coupons available for her medications that may lower her cost (for example, goodrx?).  She is at high risk for readmission since she has been to the ED 9 times in the past 6 months.  Thanks.  2.  Labs / imaging needed at time of follow-up:BMP (hypokalemic), HA1C  3.  Pending labs/ test needing follow-up: None  Follow-up Appointments: Follow-up Information   Follow up with Lesly Dukes, MD On 06/09/2013. (3:15PM)    Specialty:  Internal Medicine   Contact information:   [place] [address] [phone]       Discharge Instructions: Discharge Orders   Future Appointments Provider Department Dept Phone   06/09/2013 3:15 PM Lesly Dukes, MD [place] [phone]  06/11/2013 9:30 AM [facility] [facility] [phone]   Future Orders Complete By Expires   Call MD for:  difficulty breathing, headache or visual disturbances  As directed    Diet - low sodium heart healthy  As directed    Increase activity slowly  As directed       Consultations:  None  Procedures Performed:  Dg Chest 2 View (if Patient Has Fever And/or Copd)  06/06/2013   CLINICAL DATA:  Chest congestion with cough and shortness of breath for 3 days.  EXAM: CHEST  2 VIEW  COMPARISON:  DG CHEST 2 VIEW dated 05/03/2013  FINDINGS: Midline trachea. Normal heart size and mediastinal contours. No pleural effusion or pneumothorax. Diffuse peribronchial  thickening. No lobar consolidation.  IMPRESSION: 1.  No acute cardiopulmonary disease. 2. Mild peribronchial thickening which may relate to chronic bronchitis or smoking.   Electronically Signed   By: Abigail Miyamoto M.D.   On: 06/06/2013 15:03   Dg Thoracic Spine W/swimmers  05/11/2013   CLINICAL DATA:  Mid thoracic pain, possible compression fracture  EXAM: THORACIC SPINE - 2 VIEW + SWIMMERS  COMPARISON:  PA and lateral chest x-ray of May 03, 2013  FINDINGS: The thoracic vertebral bodies are preserved in height. The intervertebral disc space heights are well maintained. The pedicles appear intact. No abnormal paravertebral soft tissue densities are demonstrated.  IMPRESSION: No acute bony abnormality of the thoracic spine is demonstrated on this study.   Electronically Signed   By: David  Martinique   On: 05/11/2013 13:40    2D Echo: N/A  Cardiac Cath: N/A  Admission HPI: April [name], is a 43 year old female with PMH of HTN, Asthma, Obesity, OSA, depression. Presented tday with c/o SOB that started yesterday night, at about 2-3 am. Also noticed cough, that was unproductive. Patinet also noted feeling of being hot and cold with chills, endoresed subjective fevers but did not record her temperature, also with body pains and poor appereite, sore throat, runny nose, and one episode of vomiting this morning.  Hx of sick contacts- Her fiance who had similar symptoms that started the day prior to hers starting, he didn't get the flu shot, but pt did.  Noticed some leg swelling today, without pain or redness, no chest pain, uses 2-3 pillow to sleep- unchanged in the past year, no noted weight changes. Pt has an average of 2-3 hospitaizations in a year for acute asthma exacertions but no hx of intubations. Stopped smoking abiut 1-2 years ago, previously smoked 1/2 a pack a day for about 10- years.    Hospital Course by problem list: Principal Problem:   Acute asthma exacerbation Active Problems:    HYPERTENSION, ESSENTIAL NOS   Obstructive sleep apnea   #Acute Asthma Exacerbation- Pt presenting with SOB and cough, tachypnea, and tachycardia. She claims compliance with steroid inhalers and rescue inhalers- albuterol and Ipratropium and dulera. Likely provoked by viral infection, considering symptoms of chills, sore throat, sick contacts, and myalgias. No documented fevers on admission, CBC- WBC- 9.5, Chest Xray not suggestive of infection. Peak flow readings-200, 230, 150. Pts baseline- 350, but for her age- 76. She was initially admitted to step down but transferred to regular floor. She was given IVF NS 65ml/hr and duonebs. She was given 125mg  solumedrol in the ED. In the AM, she was feeling much better and her O2 sat was 100% on RA and wanted to be discharged. She was discharged home on prednisone.   Hypokalemia-  K- 3.3. Got one dose of 28meq KCL in the ED. Most likely due to albuterol treatment.   HTN- Stable. Home meds- HCTZ- 12.5mg  daily. Continued on admission.   Hyperglycemia- CBGs- 277, likely steroid response. Previous blood glucose on BMet- 124. She was started on SSI-s. HA1c was canceled.  She will need HA1c on hospital f/u.  OSA- Placed on CPAP.   Discharge Vitals:   BP 129/63  Pulse 109  Temp(Src) 98.3 F (36.8 C) (Oral)  Resp 20  Ht 5\' 7"  (1.702 m)  Wt 363 lb 8.6 oz (164.9 kg)  BMI 56.92 kg/m2  SpO2 97%  LMP 05/31/2013  Discharge Labs:  No results found for this or any previous visit (from the past 24 hour(s)).  Signed: Michail Jewels, MD [phone] 06/08/2013, 9:12 PM   Time Spent on Discharge: 69minutes Services Ordered on Discharge: None Equipment Ordered on Discharge: None

## 2013-06-09 ENCOUNTER — Ambulatory Visit (INDEPENDENT_AMBULATORY_CARE_PROVIDER_SITE_OTHER): Payer: Commercial Managed Care - PPO | Admitting: Internal Medicine

## 2013-06-09 VITALS — BP 151/88 | HR 74 | Temp 97.0°F | Ht 67.0 in | Wt 362.7 lb

## 2013-06-09 DIAGNOSIS — J45901 Unspecified asthma with (acute) exacerbation: Secondary | ICD-10-CM

## 2013-06-09 DIAGNOSIS — Z1159 Encounter for screening for other viral diseases: Secondary | ICD-10-CM

## 2013-06-09 DIAGNOSIS — I10 Essential (primary) hypertension: Secondary | ICD-10-CM

## 2013-06-09 DIAGNOSIS — M545 Low back pain, unspecified: Secondary | ICD-10-CM

## 2013-06-09 DIAGNOSIS — M549 Dorsalgia, unspecified: Secondary | ICD-10-CM

## 2013-06-09 DIAGNOSIS — Z114 Encounter for screening for human immunodeficiency virus [HIV]: Secondary | ICD-10-CM

## 2013-06-09 DIAGNOSIS — R7309 Other abnormal glucose: Secondary | ICD-10-CM

## 2013-06-09 DIAGNOSIS — M546 Pain in thoracic spine: Secondary | ICD-10-CM

## 2013-06-09 DIAGNOSIS — R739 Hyperglycemia, unspecified: Secondary | ICD-10-CM

## 2013-06-09 DIAGNOSIS — J45909 Unspecified asthma, uncomplicated: Secondary | ICD-10-CM

## 2013-06-09 DIAGNOSIS — Z0000 Encounter for general adult medical examination without abnormal findings: Secondary | ICD-10-CM

## 2013-06-09 LAB — BASIC METABOLIC PANEL WITH GFR
BUN: 15 mg/dL (ref 6–23)
CALCIUM: 8.9 mg/dL (ref 8.4–10.5)
CO2: 27 mEq/L (ref 19–32)
Chloride: 101 mEq/L (ref 96–112)
Creat: 0.84 mg/dL (ref 0.50–1.10)
GFR, Est African American: >89 mL/min
GFR, Est Non African American: 87 mL/min
Glucose, Bld: 121 mg/dL — ABNORMAL HIGH (ref 70–99)
Potassium: 3.9 mEq/L (ref 3.5–5.3)
SODIUM: 137 meq/L (ref 135–145)

## 2013-06-09 LAB — POCT GLYCOSYLATED HEMOGLOBIN (HGB A1C): Hemoglobin A1C: 5.8

## 2013-06-09 LAB — GLUCOSE, CAPILLARY: Glucose-Capillary: 114 mg/dL — ABNORMAL HIGH (ref 70–99)

## 2013-06-09 LAB — HIV ANTIBODY (ROUTINE TESTING W REFLEX): HIV: NONREACTIVE

## 2013-06-09 MED ORDER — OXYCODONE-ACETAMINOPHEN 10-325 MG PO TABS: 1.0000 | ORAL_TABLET | Freq: Four times a day (QID) | ORAL | Status: DC | PRN

## 2013-06-09 MED ORDER — ALBUTEROL SULFATE (2.5 MG/3ML) 0.083% IN NEBU: 2.5000 mg | INHALATION_SOLUTION | RESPIRATORY_TRACT | Status: DC | PRN

## 2013-06-09 MED ORDER — IPRATROPIUM BROMIDE 0.02 % IN SOLN: 0.5000 mg | RESPIRATORY_TRACT | Status: DC | PRN

## 2013-06-09 NOTE — Assessment & Plan Note (Addendum)
I am screening the patient for HIV and diabetes. Her last A1c was 5.5 in 11/11. She did have some hyperglycemia during her recent admission requiring sliding scale insulin.  ADDENDUM: A1C 5.8, HIV NR

## 2013-06-09 NOTE — Patient Instructions (Addendum)
Thanks for your visit. - Please follow up with Dr. Annamaria Boots this Friday. It would be useful if you could record your peak flows prior to this appointment so that he can review them. - Please remember to get your asthma injection at this time. - I have refilled your albuterol and ipratropium nebulizers. You can use these together every 6 hours or so to prevent an asthma attack. - Please only use your albuterol inhaler as a rescue inhaler for when you're not at home to use the nebulizer. Holley Raring will talk to you about rescheduling your MRI prior to meeting with Dr. Micheline Chapman on February 26. - Shana, our social worker, will work on getting you linked in to [facility] This is a county program to help with medication compliance and assistance. - I given you a prescription for Percocet to last you until your appointment with Dr. Micheline Chapman on February 26. - Please followup with me in one month.

## 2013-06-09 NOTE — Assessment & Plan Note (Addendum)
Patient does not appear to be in an acute asthma exacerbation. Her lung exam is significant for anterior expiratory wheezing, but she is moving air well and is in no respiratory distress. She is speaking in full sentences. I spent some time counseling the patient on her asthma medicines, but she would really benefit from additional, intensive assistance through [facility] to prevent readmission. I also wonder if literacy is an issue for her. - Continue steroid taper - No need to fill the Tamiflu at this point, patient is outside the therapeutic window - Continue home Dulera, 2 puffs per day (LABA/ICS)  - Continue albuterol inhaler PRN  - Continue duo nebs, I instructed the patient to schedule these every 4-6 hours, I provided refills for both per the patient's request - Follow up with pulmonology later this week (2/13) - She is due for a Xolair injection in their clinic - She was encouraged to measure her peak flows regularly, write them down, and bring them with her to her pulmonologist later this week - Avoid cigarette smoke exposure  - Consult to [facility] to figure out what happened to her [facility] referral placed in January by Dr. Janee Morn office - Checking BMP as patient had some mild hypokalemia during her recent admission thought secondary to albuterol - Followup in one month  ADDENDUM: BMP WNL BMET    Component Value Date/Time   NA 137 06/09/2013 1605   K 3.9 06/09/2013 1605   CL 101 06/09/2013 1605   CO2 27 06/09/2013 1605   GLUCOSE 121* 06/09/2013 1605   BUN 15 06/09/2013 1605   CREATININE 0.84 06/09/2013 1605   CREATININE 0.68 06/06/2013 1405   CALCIUM 8.9 06/09/2013 1605   GFRNONAA >90 06/06/2013 1405   GFRAA >90 06/06/2013 1405

## 2013-06-09 NOTE — Assessment & Plan Note (Signed)
BP Readings from Last 3 Encounters:  06/09/13 151/88  06/07/13 129/63  05/20/13 154/98    Lab Results  Component Value Date   NA 137 06/06/2013   K 3.3* 06/06/2013   CREATININE 0.68 06/06/2013    Assessment: Blood pressure control: mildly elevated Progress toward BP goal:  unchanged Comments: Elevated in the setting of recent asthma exacerbation  Plan: Medications:  continue current medications Other plans: BP mildly elevated, was well controlled in the hospital. Continue current management.

## 2013-06-09 NOTE — Discharge Summary (Signed)
  Date: 06/09/2013  Patient name: April [name]  Medical record number: [id number]  Date of birth: [birth year]   This patient has been seen and the plan of care was discussed with the house staff. Please see their note for complete details. I concur with their findings with the following additions/corrections:  I certainly hope that affordable inhaled corticosteroids and other maintenance asthma meds can be secured for the patient to prevent further readmissions.   Truman Hayward, MD 06/09/2013, 5:37 PM

## 2013-06-09 NOTE — Progress Notes (Signed)
Subjective:    Patient ID: April [name], female    DOB: December 03, 1970, 43 y.o.   MRN: [id number]  HPI April [name] is a 43 y.o. female with a history of chronic severe asthma, obesity, HTN, presenting for hospital follow up.   Patient was admitted to [facility] from 2/8-2/9 for acute asthma exacerbation thought 2/2 a viral trigger. She required IV Solu-Medrol, bronchodilators, and initial admission to the step down unit. Apparently she was combative with staff, refusing blood draws during her admission, and requested to leave the hospital early. She was discharged home on prednisone 60 mg for one week and Tamiflu 75 mg twice daily for 5 days (though she was not tested for flu).   Dr. Tommy Medal (inpatient attending) would like [place] to do repeat HIV testing, BMP, and A1C if she agrees to a blood draw and for [place] to assess her compliance with asthma medications. Apparently she told the team she was unable to afford her medications (though she has insurance) and was sent home with hospital issued inhalers.  She tells me she is able to afford her medicines, but right now she is paying off her insurance deductible so it's more expensive than the rest of the year. For example she did not fill her Tamiflu as it was going to cost her $30.   We discussed her asthma medicines. She is using her albuterol nebulizer about 4 times per day, and supplementing it with her albuterol inhaler. She has not been using her ipratropium nebs. She takes Dulera 2 puffs twice a day. She sometimes also takes a Flovent inhaler when she is short of breath. She gets Xolair injections every 14 days from her pulmonologist. Next one is due on Friday. She takes Prilosec every day. She has been compliant with her prednisone 60 mg daily since hospital discharge.  She tells me overall she feels less short of breath and is moving air well. When she walks distances she does sometimes become short of breath and wheezy. She is still interested in bariatric  surgery, but has not been able to complete the paperwork. I wonder if literacy is an issue for her.  Patient was referred to [facility] by Dr. Annamaria Boots, her pulmonologist, in January to help with some of her medication compliance issues. She says she has not heard anything from them, but is very interested in the program.  She will see Dr. Micheline Chapman with sports medicine on 2/26 about her back pain. Dr. [name] referred her for a thoracic lumbar spine MRI and PT in January. The patient is not sure what happened as she has not heard about either of these appointments. The referral note says she was called about a PT appointment but did not pick up the phone. She requests another prescription for Percocet to get her through to her appointment with Dr. Micheline Chapman.   Current Outpatient Prescriptions on File Prior to Visit  Medication Sig Dispense Refill  . albuterol (PROVENTIL HFA;VENTOLIN HFA) 108 (90 BASE) MCG/ACT inhaler Inhale 2 puffs into the lungs every 4 (four) hours as needed for wheezing or shortness of breath.  1 Inhaler  3  . albuterol (PROVENTIL) (2.5 MG/3ML) 0.083% nebulizer solution Take 3 mLs (2.5 mg total) by nebulization every 4 (four) hours as needed for wheezing or shortness of breath.  30 vial  3  . hydrochlorothiazide (MICROZIDE) 12.5 MG capsule Take 1 capsule (12.5 mg total) by mouth daily.  30 capsule  11  . ipratropium (ATROVENT) 0.02 % nebulizer  solution Take 2.5 mLs (0.5 mg total) by nebulization every 4 (four) hours as needed for wheezing or shortness of breath.  75 mL  2  . mometasone-formoterol (DULERA) 100-5 MCG/ACT AERO Inhale 2 puffs into the lungs 2 (two) times daily.      [name] omalizumab (XOLAIR) 150 MG injection Inject 225 mg into the skin every 14 (fourteen) days. Had inj last 04-27-13      . omeprazole (PRILOSEC) 20 MG capsule Take 20 mg by mouth daily as needed (for heartburn).       [name] oseltamivir (TAMIFLU) 75 MG capsule Take 1 capsule (75 mg total) by mouth 2 (two) times daily.  10  capsule  0  . oxyCODONE-acetaminophen (PERCOCET) 10-325 MG per tablet Take 1 tablet by mouth every 6 (six) hours as needed for pain. For back pain  30 tablet  0  . predniSONE (DELTASONE) 20 MG tablet Take 3 tablets (60 mg total) by mouth daily with breakfast.  21 tablet  0  . vitamin B-12 (CYANOCOBALAMIN) 1000 MCG tablet Take 1,000 mcg by mouth daily.        Review of Systems  Constitutional: Negative for fever and chills.  HENT: Negative for rhinorrhea.   Eyes: Negative for visual disturbance.  Respiratory: Positive for shortness of breath (with exertion) and wheezing (With exertion). Negative for chest tightness.   Cardiovascular: Negative for chest pain.  Gastrointestinal: Negative for abdominal pain.  Genitourinary: Negative for dysuria.  Musculoskeletal: Positive for back pain.  Skin: Negative for rash.  Neurological: Negative for syncope, weakness and numbness.  Psychiatric/Behavioral: Negative for dysphoric mood.      Objective:   Physical Exam  Constitutional: She is oriented to person, place, and time. She appears well-developed and well-nourished.  Sitting comfortably in the room  HENT:  Head: Normocephalic and atraumatic.  Eyes: Conjunctivae and EOM are normal. Pupils are equal, round, and reactive to light.  Neck: Normal range of motion. Neck supple.  Cardiovascular: Normal rate, regular rhythm and normal heart sounds.  Exam reveals no gallop and no friction rub.   No murmur heard. Pulmonary/Chest: Effort normal. No respiratory distress. She has wheezes (Expiratory wheezes heard loudest in the anterior lung fields). She has no rales. She exhibits no tenderness.  Normal I:E ratio, moving air fairly well, speaking in full some says  Musculoskeletal: Normal range of motion. She exhibits no edema and no tenderness.  Neurological: She is alert and oriented to person, place, and time. No cranial nerve deficit.  Skin: Skin is warm and dry.  Psychiatric: She has a normal mood  and affect.      Assessment & Plan:   Please see problem based charting.

## 2013-06-09 NOTE — Assessment & Plan Note (Signed)
During our visit the physical therapist called her, and she is now scheduled for an appointment on 2/20 at 8:45 AM. - April [name] will call her about rescheduling her MRI - Followup with Dr. Micheline Chapman on 2/26, hopefully we will be able to get the MRI done prior to this visit - Followup with PT as scheduled - Patient given a prescription for Percocet 10-325 #30 to get her through until her sports medicine appointment

## 2013-06-11 ENCOUNTER — Ambulatory Visit: Payer: Commercial Managed Care - PPO

## 2013-06-12 NOTE — Progress Notes (Signed)
Case discussed with Dr. Cater at the time of the visit.  We reviewed the resident's history and exam and pertinent patient test results.  I agree with the assessment, diagnosis, and plan of care documented in the resident's note.      

## 2013-06-16 ENCOUNTER — Telehealth: Payer: Self-pay | Admitting: Internal Medicine

## 2013-06-16 NOTE — Telephone Encounter (Signed)
Per the telephone note from 05/12/13, CY wants pt to be on CPAP. [facility] had tried several times to contact patient about this. Per CY he wanted [place] to try to refer her to [facility]. She does not qualify for [facility] or [facility]. These issues will be discussed at pt's next OV. Message will be signed off on.

## 2013-06-16 NOTE — Telephone Encounter (Signed)
Called [facility] at [phone] - lmtcb for the referral specialist to see if they would be able to accept pt

## 2013-06-16 NOTE — Telephone Encounter (Signed)
I spoke with April [name] with [facility].  Per April [name], they only accept pts who are uninsured or who have Medicaid.  Dr. Annamaria Boots, pls advise if you have any further thoughts.  Thank you.

## 2013-06-16 NOTE — Telephone Encounter (Signed)
I don't know what the initial question was- different email thread. But maybe the [facility] can help since she is not a [facility] patient.

## 2013-06-16 NOTE — Telephone Encounter (Signed)
Per phone note 05/12/13: Deneise Lever, MD at 05/12/2013 11:48 AM      Status: Signed            Is Ms Krupka potentially a [facility] social service patient? We could ask their help with this poorly compliant patient.   Per Lattie Haw they only accept medicare plans. Pt has [employer]. They will not be able to accept pt. Please advise Dr. Annamaria Boots thanks

## 2013-06-16 NOTE — Telephone Encounter (Signed)
Nope. We can discuss her issues next time she comes in.

## 2013-06-17 ENCOUNTER — Encounter: Payer: Self-pay | Admitting: Licensed Clinical Social Worker

## 2013-06-18 ENCOUNTER — Ambulatory Visit: Payer: Commercial Managed Care - PPO | Attending: Internal Medicine | Admitting: Physical Therapy

## 2013-06-18 ENCOUNTER — Ambulatory Visit (INDEPENDENT_AMBULATORY_CARE_PROVIDER_SITE_OTHER): Payer: Commercial Managed Care - PPO

## 2013-06-18 DIAGNOSIS — J45909 Unspecified asthma, uncomplicated: Secondary | ICD-10-CM

## 2013-06-21 ENCOUNTER — Ambulatory Visit: Payer: Commercial Managed Care - PPO | Admitting: Physical Therapy

## 2013-06-21 NOTE — Addendum Note (Signed)
Addended by: Hulan Fray on: 06/21/2013 11:48 AM   Modules accepted: Orders

## 2013-06-23 NOTE — Addendum Note (Signed)
Addended by: Truddie Crumble on: 06/23/2013 11:56 AM   Modules accepted: Orders

## 2013-06-24 ENCOUNTER — Ambulatory Visit: Payer: Commercial Managed Care - PPO | Admitting: Sports Medicine

## 2013-06-25 ENCOUNTER — Ambulatory Visit (INDEPENDENT_AMBULATORY_CARE_PROVIDER_SITE_OTHER): Payer: Commercial Managed Care - PPO | Admitting: Internal Medicine

## 2013-06-25 ENCOUNTER — Encounter: Payer: Self-pay | Admitting: Internal Medicine

## 2013-06-25 VITALS — BP 150/80 | HR 90 | Temp 97.8°F | Ht 67.0 in | Wt 376.6 lb

## 2013-06-25 DIAGNOSIS — K649 Unspecified hemorrhoids: Secondary | ICD-10-CM

## 2013-06-25 DIAGNOSIS — M546 Pain in thoracic spine: Secondary | ICD-10-CM

## 2013-06-25 DIAGNOSIS — Z111 Encounter for screening for respiratory tuberculosis: Secondary | ICD-10-CM

## 2013-06-25 DIAGNOSIS — M549 Dorsalgia, unspecified: Secondary | ICD-10-CM

## 2013-06-25 MED ORDER — OXYCODONE-ACETAMINOPHEN 10-325 MG PO TABS: 1.0000 | ORAL_TABLET | Freq: Three times a day (TID) | ORAL | Status: DC | PRN

## 2013-06-25 MED ORDER — OMALIZUMAB 150 MG ~~LOC~~ SOLR
225.0000 mg | Freq: Once | SUBCUTANEOUS | Status: AC
  Administered 2013-06-25: 225 mg via SUBCUTANEOUS

## 2013-06-25 MED ORDER — HYDROCORTISONE 2.5 % RE CREA: TOPICAL_CREAM | RECTAL | Status: DC

## 2013-06-25 MED ORDER — DOCUSATE SODIUM 100 MG PO CAPS: 100.0000 mg | ORAL_CAPSULE | Freq: Every day | ORAL | Status: DC | PRN

## 2013-06-25 NOTE — Patient Instructions (Addendum)
You had a PPD placed today so please come back to [facility] on Monday 3/2 to have it read.  If your hemorrhoids are not feeling better by Monday morning, please call [phone] before you come in so you can make a doctor's appointment as well.   We sent in prescriptions for both ANUSOL rectal cream to help with your hemorrhoids as well as COLACE stool softener to help prevent straining.  You should use this cream twice daily through next Tuesday 3/3 only to avoid skin complications that can happen with extended steroid exposure.  You should also take daily sitz baths where you fill a bathtub with hot water then sit in it for 10-15 minutes.   We also gave you a prescription for PERCOCET that you should only take as absolutely needed for pain.  Again, you should not take this medication before going to work as it can make you drowsy.  Furthermore, it also will increase constipation which will worsen hemorrhoids.  This is a one time prescription that must last you until your sports medicine appointment; all further pain medicine prescriptions must come from your PCP Dr. Lucila [place].    Hemorrhoids Hemorrhoids are puffy (swollen) veins around the rectum or anus. Hemorrhoids can cause pain, itching, bleeding, or irritation. HOME CARE  Eat foods with fiber, such as whole grains, beans, nuts, fruits, and vegetables. Ask your doctor about taking products with added fiber in them (fibersupplements).  Drink enough fluid to keep your pee (urine) clear or pale yellow.  Exercise often.  Go to the bathroom when you have the urge to poop. Do not wait.  Avoid straining to poop (bowel movement).  Keep the butt area dry and clean. Use wet toilet paper or moist paper towels.  Medicated creams and medicine inserted into the anus (anal suppository) may be used or applied as told.  Only take medicine as told by your doctor.  Take a warm water bath (sitz bath) for 15 20 minutes to ease pain. Do this 3 4 times a  day.  Place ice packs on the area if it is tender or puffy. Use the ice packs between the warm water baths.  Put ice in a plastic bag.  Place a towel between your skin and the bag.  Leave the ice on for 15-20 minutes, 03-04 times a day.  Do not use a donut-shaped pillow or sit on the toilet for a long time. GET HELP RIGHT AWAY IF:   You have more pain that is not controlled by treatment or medicine.  You have bleeding that will not stop.  You have trouble or are unable to poop (bowel movement).  You have pain or puffiness outside the area of the hemorrhoids. MAKE SURE YOU:   Understand these instructions.  Will watch your condition.  Will get help right away if you are not doing well or get worse. Document Released: 01/23/2008 Document Revised: 04/01/2012 Document Reviewed: 02/25/2012 [facility] Patient Information 2014 [place], [place].

## 2013-06-25 NOTE — Progress Notes (Signed)
Patient ID: April [name], female   DOB: May 10, 1970, 43 y.o.   MRN: [id number]   Subjective:   Patient ID: April [name] female   DOB: 06/25/1970 43 y.o.   MRN: [id number]  HPI: April [name] is a 43 y.o. woman with extensive PMH including hemorrhoids who presents for acute visit for rectal pain.   Patient states she has been having rectal pain since Tuesday 2/24 PM.  She is having some associated itching and minimal blood on toilet paper with wiping.  She bought Preparation H, Tucks over the counter which she has been using with little relief.   She has a history of hemorrhoids and believes this is source of her pain.  States that she got relief in past from a cream she was prescribed in the hospital.  She is also requesting refill on Percocet (#30) that PCP prescribed on 06/09/13 as she has already taken all of them.   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  States she is available on Monday to return to clinic to have it read.  Past Medical History  Diagnosis Date  . Acanthosis nigricans   . Menorrhagia   . Hypertension, essential   . Insomnia   . Morbid obesity   . Helicobacter pylori (H. pylori) infection   . Sleep apnea     Sleep study 2008 - mild OSA, not enough events to titrate CPAP  . COPD (chronic obstructive pulmonary disease)     PFTs in 2002, FEV1/FVC 65, no post bronchodilater test done  . Asthma     Followed by Dr. Annamaria Boots (pulmonology); receives every other week omalizumab injections; has frequent exacerbations  . Depression   . GERD (gastroesophageal reflux disease)   . Tobacco user   . Obesity    Current Outpatient Prescriptions  Medication Sig Dispense Refill  . albuterol (PROVENTIL HFA;VENTOLIN HFA) 108 (90 BASE) MCG/ACT inhaler Inhale 2 puffs into the lungs every 4 (four) hours as needed for wheezing or shortness of breath.  1 Inhaler  3  . albuterol (PROVENTIL) (2.5 MG/3ML) 0.083% nebulizer solution Take 3 mLs (2.5 mg total) by nebulization every  4 (four) hours as needed for wheezing or shortness of breath.  30 vial  11  . hydrochlorothiazide (MICROZIDE) 12.5 MG capsule Take 1 capsule (12.5 mg total) by mouth daily.  30 capsule  11  . ipratropium (ATROVENT) 0.02 % nebulizer solution Take 2.5 mLs (0.5 mg total) by nebulization every 4 (four) hours as needed for wheezing or shortness of breath.  75 mL  11  . mometasone-formoterol (DULERA) 100-5 MCG/ACT AERO Inhale 2 puffs into the lungs 2 (two) times daily.      [name] omalizumab (XOLAIR) 150 MG injection Inject 225 mg into the skin every 14 (fourteen) days. Had inj last 04-27-13      . omeprazole (PRILOSEC) 20 MG capsule Take 20 mg by mouth daily as needed (for heartburn).       [name] oseltamivir (TAMIFLU) 75 MG capsule Take 1 capsule (75 mg total) by mouth 2 (two) times daily.  10 capsule  0  . oxyCODONE-acetaminophen (PERCOCET) 10-325 MG per tablet Take 1 tablet by mouth every 6 (six) hours as needed for pain. For back pain  30 tablet  0  . predniSONE (DELTASONE) 20 MG tablet Take 3 tablets (60 mg total) by mouth daily with breakfast.  21 tablet  0  . vitamin B-12 (CYANOCOBALAMIN) 1000 MCG tablet Take 1,000 mcg by  mouth daily.       No current facility-administered medications for this visit.   Family History  Problem Relation Age of Onset  . Asthma Daughter   . Cancer Paternal Aunt   . Asthma Maternal Grandmother   . Hypertension Mother    History   Social History  . Marital Status: Single    Spouse Name: N/A    Number of Children: 1  . 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 Smoking status: Former Smoker -- 0.25 packs/day for 9 years    Types: Cigarettes    Quit date: 10/04/2012  . Smokeless tobacco: Never Used  . Alcohol Use: No  . Drug Use: No  . Sexual Activity: Yes   Other Topics Concern  . 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    Review of Systems: Review of Systems  Constitutional: Negative for fever.  Eyes: Negative for blurred vision.  Respiratory: Negative for cough and shortness of breath.   Cardiovascular: Negative for chest pain.  Gastrointestinal: Positive for constipation. Negative for nausea, vomiting, abdominal pain, diarrhea and blood in stool.       Not really constipated in terms of stool consistency but feels that she is straining.  Genitourinary: Negative for dysuria.  Musculoskeletal: Negative for falls.  Skin: Positive for itching.       rectal  Neurological: Negative for dizziness, loss of consciousness, weakness and headaches.    Objective:  Physical Exam: Filed Vitals:   06/25/13 1520  BP: 150/80  Pulse: 90  Temp: 97.8 F (36.6 C)  TempSrc: Oral  Height: 5\' 7"  (1.702 m)  Weight: 376 lb 9.6 oz (170.825 kg)  SpO2: 99%   General: alert, cooperative, and in no apparent distress HEENT: NCAT, vision grossly intact, oropharynx clear and non-erythematous  Neck: supple, no lymphadenopathy Lungs: clear to ascultation bilaterally, normal work of respiration, no wheezes, rales, ronchi Heart: regular rate and rhythm, no murmurs, gallops, or rubs Abdomen: soft, non-tender, non-distended, normal bowel sounds Extremities: 2+ DP/PT pulses bilaterally, no cyanosis, clubbing, or edema Neurologic: alert & oriented X3, cranial nerves II-XII intact, strength grossly intact, sensation intact to light touch Patient declined rectal exam today.  Assessment & Plan:  Patient discussed with Dr. Ellwood Dense.  Please see problem-based assessment and plan.

## 2013-06-25 NOTE — Assessment & Plan Note (Addendum)
Patient presents with complaint of rectal pain due to hemorrhoids but declined rectal exam today.  She has gotten little to no relief with OTC Preparation H and Tucks.  Sent in prescriptions for Anusol 2.5% cream to apply BID as well as Colace to prevent straining.  Instructed patient to use Anusol through 06/29/13 only (given that she started Preparation H on 2/24) to avoid skin complications from sustained topical steroids.  She also agreed to do daily 10-15 minute sitz baths.  She has to return to clinic on Monday 3/2 to have PPD read; instructed her to call clinic that morning if hemorrhoids are not improved to make MD appt as well.  If still not resolved, explained to her that we will have to do rectal exam and consider referral to surgery at that time.  Also gave one time prescription for Percocet 5-325 mg prn #10 that patient may not fill as she stated "it's not worth it if you are only giving me ten."  Explained to her she should only take these as absolutely needed as they can cause constipation that would make hemorrhoids worse as well as drowsiness.  She needs to get all pain medication from PCP in future.

## 2013-06-26 ENCOUNTER — Encounter (HOSPITAL_COMMUNITY): Payer: Self-pay | Admitting: Emergency Medicine

## 2013-06-26 ENCOUNTER — Emergency Department (HOSPITAL_COMMUNITY)
Admission: EM | Admit: 2013-06-26 | Discharge: 2013-06-26 | Disposition: A | Discharge status: Home / Self Care | Payer: Commercial Managed Care - PPO | Attending: Emergency Medicine | Admitting: Emergency Medicine

## 2013-06-26 DIAGNOSIS — J4489 Other specified chronic obstructive pulmonary disease: Secondary | ICD-10-CM | POA: Insufficient documentation

## 2013-06-26 DIAGNOSIS — Z79899 Other long term (current) drug therapy: Secondary | ICD-10-CM | POA: Insufficient documentation

## 2013-06-26 DIAGNOSIS — F329 Major depressive disorder, single episode, unspecified: Secondary | ICD-10-CM | POA: Insufficient documentation

## 2013-06-26 DIAGNOSIS — Z872 Personal history of diseases of the skin and subcutaneous tissue: Secondary | ICD-10-CM | POA: Insufficient documentation

## 2013-06-26 DIAGNOSIS — Z8619 Personal history of other infectious and parasitic diseases: Secondary | ICD-10-CM | POA: Insufficient documentation

## 2013-06-26 DIAGNOSIS — I10 Essential (primary) hypertension: Secondary | ICD-10-CM | POA: Insufficient documentation

## 2013-06-26 DIAGNOSIS — F3289 Other specified depressive episodes: Secondary | ICD-10-CM | POA: Insufficient documentation

## 2013-06-26 DIAGNOSIS — K219 Gastro-esophageal reflux disease without esophagitis: Secondary | ICD-10-CM | POA: Insufficient documentation

## 2013-06-26 DIAGNOSIS — Z87891 Personal history of nicotine dependence: Secondary | ICD-10-CM | POA: Insufficient documentation

## 2013-06-26 DIAGNOSIS — J449 Chronic obstructive pulmonary disease, unspecified: Secondary | ICD-10-CM | POA: Insufficient documentation

## 2013-06-26 DIAGNOSIS — IMO0002 Reserved for concepts with insufficient information to code with codable children: Secondary | ICD-10-CM | POA: Insufficient documentation

## 2013-06-26 DIAGNOSIS — G4733 Obstructive sleep apnea (adult) (pediatric): Secondary | ICD-10-CM | POA: Insufficient documentation

## 2013-06-26 DIAGNOSIS — Z8742 Personal history of other diseases of the female genital tract: Secondary | ICD-10-CM | POA: Insufficient documentation

## 2013-06-26 DIAGNOSIS — K644 Residual hemorrhoidal skin tags: Secondary | ICD-10-CM | POA: Insufficient documentation

## 2013-06-26 MED ORDER — HYDROCORTISONE ACE-PRAMOXINE 1-1 % RE FOAM: 1.0000 | Freq: Two times a day (BID) | RECTAL | Status: DC

## 2013-06-26 MED ORDER — HYDROCORTISONE ACETATE 25 MG RE SUPP: 25.0000 mg | Freq: Two times a day (BID) | RECTAL | Status: DC

## 2013-06-26 NOTE — ED Notes (Addendum)
C/o rectal hemorrhoid pain since Tuesday. Has been bleeding. Seen by [facility] yesterday. No relief with "tucks, preparation H, suppository, percocet 10 & stool softener". Can't wait til Monday am. Last BM 1 hr ago without issues.

## 2013-06-26 NOTE — ED Provider Notes (Signed)
CSN: [id number]     Arrival date & time 06/26/13  2204 History  This chart was scribed for non-physician practitioner working with Wandra Arthurs, MD by Mercy Moore, ED Scribe. This patient was seen in room TR10C/TR10C and the patient's care was started at 10:56 PM.   Chief Complaint  Patient presents with  . Hemorrhoids      The history is provided by the patient. No language interpreter was used.   HPI Comments: April [name] is a 43 y.o. female who presents to the Emergency Department complaining of worsening hemorrhoids to rectum, onset five days ago. Patient reports that she went to the clinic yesterday and was given cream, stool softener, and pain medicine, but has experienced no relief. Patient has also attempted to treat hemorrhoid pain with Tucks pads, Preparation H and other OTC medications without relief. Patient reports having a bowel movement one hour prior to arrival to the ED. She reports escessive bright red blood present on toilet paper when wiping. Patient denies itching of rectum.    Past Medical History  Diagnosis Date  . Acanthosis nigricans   . Menorrhagia   . Hypertension, essential   . Insomnia   . Morbid obesity   . Helicobacter pylori (H. pylori) infection   . Sleep apnea     Sleep study 2008 - mild OSA, not enough events to titrate CPAP  . COPD (chronic obstructive pulmonary disease)     PFTs in 2002, FEV1/FVC 65, no post bronchodilater test done  . Asthma     Followed by Dr. Annamaria Boots (pulmonology); receives every other week omalizumab injections; has frequent exacerbations  . Depression   . GERD (gastroesophageal reflux disease)   . Tobacco user   . Obesity    Past Surgical History  Procedure Laterality Date  . Tubal ligation  1996    bilateral  . Breast reduction surgery  09/2011   Family History  Problem Relation Age of Onset  . Asthma Daughter   . Cancer Paternal Aunt   . Asthma Maternal Grandmother   . Hypertension Mother    History   Substance Use Topics  . Smoking status: Former Smoker -- 0.25 packs/day for 9 years    Types: Cigarettes    Quit date: 10/04/2012  . Smokeless tobacco: Never Used  . Alcohol Use: No   OB History   Grav Para Term Preterm Abortions TAB SAB Ect Mult Living                 Review of Systems  Gastrointestinal:       Hemorrhoids.   All other systems reviewed and are negative.      Allergies  Ace inhibitors and Percocet  Home Medications   Current Outpatient Rx  Name  Route  Sig  Dispense  Refill  . albuterol (PROVENTIL HFA;VENTOLIN HFA) 108 (90 BASE) MCG/ACT inhaler   Inhalation   Inhale 2 puffs into the lungs every 4 (four) hours as needed for wheezing or shortness of breath.   1 Inhaler   3   . albuterol (PROVENTIL) (2.5 MG/3ML) 0.083% nebulizer solution   Nebulization   Take 3 mLs (2.5 mg total) by nebulization every 4 (four) hours as needed for wheezing or shortness of breath.   30 vial   11   . docusate sodium (COLACE) 100 MG capsule   Oral   Take 100 mg by mouth daily as needed for mild constipation.         . hydrochlorothiazide (  MICROZIDE) 12.5 MG capsule   Oral   Take 1 capsule (12.5 mg total) by mouth daily.   30 capsule   11   . hydrocortisone (ANUSOL-HC) 2.5 % rectal cream      Apply rectally 2 times daily   30 g   1   . ipratropium (ATROVENT) 0.02 % nebulizer solution   Nebulization   Take 2.5 mLs (0.5 mg total) by nebulization every 4 (four) hours as needed for wheezing or shortness of breath.   75 mL   11   . mometasone-formoterol (DULERA) 100-5 MCG/ACT AERO   Inhalation   Inhale 2 puffs into the lungs 2 (two) times daily.         [name] omalizumab (XOLAIR) 150 MG injection   Subcutaneous   Inject 225 mg into the skin every 14 (fourteen) days. Had inj last 06-18-12         . omeprazole (PRILOSEC) 20 MG capsule   Oral   Take 20 mg by mouth daily as needed (for heartburn).          [name] oxyCODONE-acetaminophen (PERCOCET) 10-325 MG per  tablet   Oral   Take 1 tablet by mouth every 8 (eight) hours as needed for pain. For back pain   10 tablet   0   . predniSONE (DELTASONE) 20 MG tablet   Oral   Take 3 tablets (60 mg total) by mouth daily with breakfast.   21 tablet   0    BP 161/110  Pulse 93  Temp(Src) 98.3 F (36.8 C) (Oral)  Resp 22  Ht 5\' 5"  (1.651 m)  Wt 378 lb (171.46 kg)  BMI 62.90 kg/m2  SpO2 99%  LMP 05/31/2013 Physical Exam  Nursing note and vitals reviewed. Constitutional: She is oriented to person, place, and time. She appears well-developed and well-nourished. No distress.  HENT:  Head: Normocephalic and atraumatic.  Mouth/Throat: Oropharynx is clear and moist.  Eyes: Conjunctivae are normal. No scleral icterus.  Neck: Normal range of motion.  Pulmonary/Chest: Effort normal.  Genitourinary: Rectal exam shows external hemorrhoid and tenderness. Rectal exam shows no mass and anal tone normal. Guaiac positive stool.     Musculoskeletal: Normal range of motion. She exhibits no edema and no tenderness.  Neurological: She is alert and oriented to person, place, and time. She exhibits normal muscle tone. Coordination normal.  Skin: Skin is warm and dry. No rash noted. No erythema. No pallor.  Psychiatric: She has a normal mood and affect. Her behavior is normal. Judgment and thought content normal.    ED Course  Procedures (including critical care time) DIAGNOSTIC STUDIES: Oxygen Saturation is 99% on room air, normal by my interpretation.    COORDINATION OF CARE: 11:05 PM- Pt advised of plan for treatment and pt agrees.    Labs Review Labs Reviewed - No data to display Imaging Review No results found.   EKG Interpretation None      MDM   External hemorrhoids  Patient here with two large external hemorrhoids, has appointment with [facility] tomorrow and will discuss with them surgical referral which I suspect will be needed.  I discussed with the patient that the pain medication will  only worsen the problem and have asked that she stop this all together and use benadryl for help with sleeping.  She agrees to this.  Will give anusol suppositories and proctofoam as well.  I personally performed the services described in this documentation, which was scribed in my presence. The recorded  information has been reviewed and is accurate.    Idalia Needle Joelyn Oms, PA-C 06/26/13 2322

## 2013-06-26 NOTE — Discharge Instructions (Signed)
Fiber Content in Foods Drinking plenty of fluids and consuming foods high in fiber can help with constipation. See the list below for the fiber content of some common foods. Starches and Grains / Dietary Fiber (g)  Cheerios, 1 cup / 3 g  Kellogg's Corn Flakes, 1 cup / 0.7 g  Rice Krispies, 1  cup / 0.3 g  Quaker Oat Life Cereal,  cup / 2.1 g  Oatmeal, instant (cooked),  cup / 2 g  Kellogg's Frosted Mini Wheats, 1 cup / 5.1 g  Rice, brown, long-grain (cooked), 1 cup / 3.5 g  Rice, white, long-grain (cooked), 1 cup / 0.6 g  Macaroni, cooked, enriched, 1 cup / 2.5 g Legumes / Dietary Fiber (g)  Beans, baked, canned, plain or vegetarian,  cup / 5.2 g  Beans, kidney, canned,  cup / 6.8 g  Beans, pinto, dried (cooked),  cup / 7.7 g  Beans, pinto, canned,  cup / 5.5 g Breads and Crackers / Dietary Fiber (g)  Graham crackers, plain or honey, 2 squares / 0.7 g  Saltine crackers, 3 squares / 0.3 g  Pretzels, plain, salted, 10 pieces / 1.8 g  Bread, whole-wheat, 1 slice / 1.9 g  Bread, white, 1 slice / 0.7 g  Bread, raisin, 1 slice / 1.2 g  Bagel, plain, 3 oz / 2 g  Tortilla, flour, 1 oz / 0.9 g  Tortilla, corn, 1 small / 1.5 g  Bun, hamburger or hotdog, 1 small / 0.9 g Fruits / Dietary Fiber (g)  Apple, raw with skin, 1 medium / 4.4 g  Applesauce, sweetened,  cup / 1.5 g  Banana,  medium / 1.5 g  Grapes, 10 grapes / 0.4 g  Orange, 1 small / 2.3 g  Raisin, 1.5 oz / 1.6 g  Melon, 1 cup / 1.4 g Vegetables / Dietary Fiber (g)  Green beans, canned,  cup / 1.3 g  Carrots (cooked),  cup / 2.3 g  Broccoli (cooked),  cup / 2.8 g  Peas, frozen (cooked),  cup / 4.4 g  Potatoes, mashed,  cup / 1.6 g  Lettuce, 1 cup / 0.5 g  Corn, canned,  cup / 1.6 g  Tomato,  cup / 1.1 g Document Released: 09/01/2006 Document Revised: 07/08/2011 Document Reviewed: 10/27/2006 [employer] Patient Information 2014 [place], [place].  Hemorrhoid  Banding Hemorrhoids are veins in the anus and lower rectum that become enlarged. The most common symptoms are rectal bleeding, itching, and sometimes pain. Hemorrhoids might come out with straining or having a bowel movement, and they can sometimes be pushed back in. There are internal and external hemorrhoids. Only internal hemorrhoids can be treated with banding. In this procedure, a rubber band is placed near the hemorrhoid tissue, cutting off the blood supply. This procedure prevents the hemorrhoids from slipping down. LET YOUR CAREGIVER KNOW ABOUT: All medicines you are taking, especially blood thinners such as aspirin and coumadin.  RISKS AND COMPLICATIONS This is not a painful procedure, but if you do have intense pain immediately let your surgeon know because the band may need to be removed. You may have some mild pain or discomfort in the first 2 days or so after treatment. Sometimes there may be delayed bleeding in the first week after treatment.  BEFORE THE PROCEDURE  There is no special preparation needed before banding. Your surgeon may have you do an enema prior to the procedure. You will go home the same day.  HOME CARE INSTRUCTIONS  Your surgeon might instruct you to do sitz baths as needed if you have discomfort or after a bowel movement.  You may be instructed to use fiber supplements. SEEK MEDICAL CARE IF:  You have an increase in pain.  Your pain does not get better. SEEK IMMEDIATE MEDICAL CARE IF:  You have intense pain.  Fever greater than 100.5 F (38.1 C).  Bleeding that does not stop, or pus from the anus. Document Released: 02/10/2009 Document Revised: 07/08/2011 Document Reviewed: 02/10/2009 [facility] Patient Information 2014 [place], [place].  Hemorrhoids Hemorrhoids are swollen veins around the rectum or anus. There are two types of hemorrhoids:   Internal hemorrhoids. These occur in the veins just inside the rectum. They may poke through to the outside and  become irritated and painful.  External hemorrhoids. These occur in the veins outside the anus and can be felt as a painful swelling or hard lump near the anus. CAUSES  Pregnancy.   Obesity.   Constipation or diarrhea.   Straining to have a bowel movement.   Sitting for long periods on the toilet.  Heavy lifting or other activity that caused you to strain.  Anal intercourse. SYMPTOMS   Pain.   Anal itching or irritation.   Rectal bleeding.   Fecal leakage.   Anal swelling.   One or more lumps around the anus.  DIAGNOSIS  Your caregiver may be able to diagnose hemorrhoids by visual examination. Other examinations or tests that may be performed include:   Examination of the rectal area with a gloved hand (digital rectal exam).   Examination of anal canal using a small tube (scope).   A blood test if you have lost a significant amount of blood.  A test to look inside the colon (sigmoidoscopy or colonoscopy). TREATMENT Most hemorrhoids can be treated at home. However, if symptoms do not seem to be getting better or if you have a lot of rectal bleeding, your caregiver may perform a procedure to help make the hemorrhoids get smaller or remove them completely. Possible treatments include:   Placing a rubber band at the base of the hemorrhoid to cut off the circulation (rubber band ligation).   Injecting a chemical to shrink the hemorrhoid (sclerotherapy).   Using a tool to burn the hemorrhoid (infrared light therapy).   Surgically removing the hemorrhoid (hemorrhoidectomy).   Stapling the hemorrhoid to block blood flow to the tissue (hemorrhoid stapling).  HOME CARE INSTRUCTIONS   Eat foods with fiber, such as whole grains, beans, nuts, fruits, and vegetables. Ask your doctor about taking products with added fiber in them (fibersupplements).  Increase fluid intake. Drink enough water and fluids to keep your urine clear or pale yellow.   Exercise  regularly.   Go to the bathroom when you have the urge to have a bowel movement. Do not wait.   Avoid straining to have bowel movements.   Keep the anal area dry and clean. Use wet toilet paper or moist towelettes after a bowel movement.   Medicated creams and suppositories may be used or applied as directed.   Only take over-the-counter or prescription medicines as directed by your caregiver.   Take warm sitz baths for 15 20 minutes, 3 4 times a day to ease pain and discomfort.   Place ice packs on the hemorrhoids if they are tender and swollen. Using ice packs between sitz baths may be helpful.   Put ice in a plastic bag.   Place a towel between your skin  and the bag.   Leave the ice on for 15 20 minutes, 3 4 times a day.   Do not use a donut-shaped pillow or sit on the toilet for long periods. This increases blood pooling and pain.  SEEK MEDICAL CARE IF:  You have increasing pain and swelling that is not controlled by treatment or medicine.  You have uncontrolled bleeding.  You have difficulty or you are unable to have a bowel movement.  You have pain or inflammation outside the area of the hemorrhoids. MAKE SURE YOU:  Understand these instructions.  Will watch your condition.  Will get help right away if you are not doing well or get worse. Document Released: 04/12/2000 Document Revised: 04/01/2012 Document Reviewed: 02/18/2012 [facility] Patient Information 2014 [place].

## 2013-06-26 NOTE — ED Provider Notes (Signed)
Medical screening examination/treatment/procedure(s) were performed by non-physician practitioner and as supervising physician I was immediately available for consultation/collaboration.   EKG Interpretation None        Wandra Arthurs, MD 06/26/13 2358

## 2013-06-28 ENCOUNTER — Encounter: Payer: Self-pay | Admitting: Gastroenterology

## 2013-06-28 ENCOUNTER — Ambulatory Visit (INDEPENDENT_AMBULATORY_CARE_PROVIDER_SITE_OTHER): Payer: Commercial Managed Care - PPO | Admitting: Internal Medicine

## 2013-06-28 VITALS — BP 146/90 | HR 90 | Temp 99.2°F | Ht 67.0 in | Wt 372.1 lb

## 2013-06-28 DIAGNOSIS — I10 Essential (primary) hypertension: Secondary | ICD-10-CM

## 2013-06-28 DIAGNOSIS — K649 Unspecified hemorrhoids: Secondary | ICD-10-CM

## 2013-06-28 LAB — TB SKIN TEST
Induration: <5 mm
TB Skin Test: NEGATIVE

## 2013-06-28 MED ORDER — HYDROCORTISONE ACE-PRAMOXINE 1-1 % RE FOAM: 1.0000 | Freq: Two times a day (BID) | RECTAL | Status: DC

## 2013-06-28 MED ORDER — PSYLLIUM 28 % PO PACK: 1.0000 | PACK | Freq: Two times a day (BID) | ORAL | Status: DC

## 2013-06-28 NOTE — Progress Notes (Signed)
[facility]  Subjective:     April [name] is a 43 y.o. female who presents for follow up of hemorrhoids. She was seen on Friday in our clinic she refused a rectal exam at that time but was prescribed Anusol and colace.  Over the weekend her rectal pain worsened and she was seen in the ED.  Rectal exam at that time revealed external hemorrhoids.   She was also prescribed Anusol suppositories and proctofoam cream.  She reports only minimal improvement since that time.  Review of Systems Review of Systems  Constitutional: Negative for fever.  Eyes: Negative for blurred vision.  Respiratory: Negative for cough and shortness of breath.   Cardiovascular: Negative for chest pain.  Gastrointestinal: Positive for constipation. Negative for nausea, vomiting, abdominal pain, diarrhea and blood in stool (blood on TP).  Genitourinary: Negative for dysuria and frequency.  Musculoskeletal: Negative for falls.  Skin: Positive for itching.       rectal  Neurological: Negative for dizziness, loss of consciousness, weakness and headaches.     Objective:    BP 173/103  Pulse 90  Temp(Src) 99.2 F (37.3 C) (Oral)  Ht 5\' 7"  (1.702 m)  Wt 372 lb 1.6 oz (168.783 kg)  BMI 58.27 kg/m2  SpO2 99%  LMP 05/31/2013 Physical Exam  Nursing note and vitals reviewed. Constitutional: No distress.  Obese  HENT:  Head: Normocephalic and atraumatic.  Cardiovascular: Normal rate, regular rhythm and normal heart sounds.   Pulmonary/Chest: Effort normal and breath sounds normal. No respiratory distress.  Abdominal: Soft. Bowel sounds are normal. There is no tenderness.  Genitourinary: Rectal exam shows external hemorrhoid (2cm at 5 oclock position, patient refused DRE).  Skin: She is not diaphoretic.    Assessment:    External hemorrhoid   Plan:    Discussed symptomatic and preventative measures regarding hemorrhoidal disease. Recommended fiber supplementation. Anusol suppositories as needed.   Short term use only.  Referral to GI.  Her PPD was also read at this visit and was negative.

## 2013-06-28 NOTE — Progress Notes (Signed)
Case discussed with Dr. Rogers at the time of the visit.  We reviewed the resident's history and exam and pertinent patient test results.  I agree with the assessment, diagnosis, and plan of care documented in the resident's note. 

## 2013-06-28 NOTE — Assessment & Plan Note (Signed)
BP Readings from Last 3 Encounters:  06/28/13 146/90  06/26/13 160/90  06/25/13 150/80    Lab Results  Component Value Date   NA 137 06/09/2013   K 3.9 06/09/2013   CREATININE 0.84 06/09/2013    Assessment: Blood pressure control: controlled Progress toward BP goal:  at goal Comments: patient in acute pain today from hemorrhoid  Plan: Medications:  Will continue HCTZ 12.5 daily at this time, will reassess BP at next visit. Educational resources provided:   Self management tools provided:   Other plans: None

## 2013-06-28 NOTE — Assessment & Plan Note (Addendum)
Patient prescribed Metamucil, refill of proctofoam and encouraged to continue anucort suppositories.  She was instructed to avoid Percocet and to avoid straining.  Patient have been given a referral to GI. I am concerned this may represent a thrombosed external hemorrhoid however patient has now has this pain >72 hours, and was not excised in the ED, she may benefit more from conservative management. If pain worsens she may need a more urgent referral to a general surgeon for removal.

## 2013-06-28 NOTE — Patient Instructions (Addendum)
Will will call with your appointment with Gastroenterology. Please continue Proctofoam and Sitz baths.  Take Metamucil to avoid constipation.  Hemorrhoids Hemorrhoids are swollen veins around the rectum or anus. There are two types of hemorrhoids:   Internal hemorrhoids. These occur in the veins just inside the rectum. They may poke through to the outside and become irritated and painful.  External hemorrhoids. These occur in the veins outside the anus and can be felt as a painful swelling or hard lump near the anus. CAUSES  Pregnancy.   Obesity.   Constipation or diarrhea.   Straining to have a bowel movement.   Sitting for long periods on the toilet.  Heavy lifting or other activity that caused you to strain.  Anal intercourse. SYMPTOMS   Pain.   Anal itching or irritation.   Rectal bleeding.   Fecal leakage.   Anal swelling.   One or more lumps around the anus.  DIAGNOSIS  Your caregiver may be able to diagnose hemorrhoids by visual examination. Other examinations or tests that may be performed include:   Examination of the rectal area with a gloved hand (digital rectal exam).   Examination of anal canal using a small tube (scope).   A blood test if you have lost a significant amount of blood.  A test to look inside the colon (sigmoidoscopy or colonoscopy). TREATMENT Most hemorrhoids can be treated at home. However, if symptoms do not seem to be getting better or if you have a lot of rectal bleeding, your caregiver may perform a procedure to help make the hemorrhoids get smaller or remove them completely. Possible treatments include:   Placing a rubber band at the base of the hemorrhoid to cut off the circulation (rubber band ligation).   Injecting a chemical to shrink the hemorrhoid (sclerotherapy).   Using a tool to burn the hemorrhoid (infrared light therapy).   Surgically removing the hemorrhoid (hemorrhoidectomy).   Stapling the  hemorrhoid to block blood flow to the tissue (hemorrhoid stapling).  HOME CARE INSTRUCTIONS   Eat foods with fiber, such as whole grains, beans, nuts, fruits, and vegetables. Ask your doctor about taking products with added fiber in them (fibersupplements).  Increase fluid intake. Drink enough water and fluids to keep your urine clear or pale yellow.   Exercise regularly.   Go to the bathroom when you have the urge to have a bowel movement. Do not wait.   Avoid straining to have bowel movements.   Keep the anal area dry and clean. Use wet toilet paper or moist towelettes after a bowel movement.   Medicated creams and suppositories may be used or applied as directed.   Only take over-the-counter or prescription medicines as directed by your caregiver.   Take warm sitz baths for 15 20 minutes, 3 4 times a day to ease pain and discomfort.   Place ice packs on the hemorrhoids if they are tender and swollen. Using ice packs between sitz baths may be helpful.   Put ice in a plastic bag.   Place a towel between your skin and the bag.   Leave the ice on for 15 20 minutes, 3 4 times a day.   Do not use a donut-shaped pillow or sit on the toilet for long periods. This increases blood pooling and pain.  SEEK MEDICAL CARE IF:  You have increasing pain and swelling that is not controlled by treatment or medicine.  You have uncontrolled bleeding.  You have difficulty or you are  unable to have a bowel movement.  You have pain or inflammation outside the area of the hemorrhoids. MAKE SURE YOU:  Understand these instructions.  Will watch your condition.  Will get help right away if you are not doing well or get worse. Document Released: 04/12/2000 Document Revised: 04/01/2012 Document Reviewed: 02/18/2012 [facility] Patient Information 2014 [place].

## 2013-07-01 ENCOUNTER — Ambulatory Visit: Payer: Commercial Managed Care - PPO | Admitting: Sports Medicine

## 2013-07-01 NOTE — Progress Notes (Signed)
Case discussed with Dr. Hoffman at the time of the visit.  We reviewed the resident's history and exam and pertinent patient test results.  I agree with the assessment, diagnosis, and plan of care documented in the resident's note. 

## 2013-07-02 ENCOUNTER — Ambulatory Visit (INDEPENDENT_AMBULATORY_CARE_PROVIDER_SITE_OTHER): Payer: Commercial Managed Care - PPO

## 2013-07-02 ENCOUNTER — Telehealth: Payer: Self-pay | Admitting: Internal Medicine

## 2013-07-02 DIAGNOSIS — J45909 Unspecified asthma, uncomplicated: Secondary | ICD-10-CM

## 2013-07-02 NOTE — Telephone Encounter (Signed)
Pt made aware that we do not have samples at this time.

## 2013-07-07 ENCOUNTER — Encounter: Payer: Commercial Managed Care - PPO | Admitting: Internal Medicine

## 2013-07-07 MED ORDER — OMALIZUMAB 150 MG ~~LOC~~ SOLR
225.0000 mg | Freq: Once | SUBCUTANEOUS | Status: AC
  Administered 2013-07-07: 225 mg via SUBCUTANEOUS

## 2013-07-14 ENCOUNTER — Encounter: Payer: Self-pay | Admitting: Internal Medicine

## 2013-07-14 ENCOUNTER — Ambulatory Visit (INDEPENDENT_AMBULATORY_CARE_PROVIDER_SITE_OTHER): Payer: Commercial Managed Care - PPO | Admitting: Internal Medicine

## 2013-07-14 VITALS — BP 156/99 | HR 98 | Temp 98.3°F | Ht 67.0 in | Wt 366.5 lb

## 2013-07-14 DIAGNOSIS — J45909 Unspecified asthma, uncomplicated: Secondary | ICD-10-CM

## 2013-07-14 DIAGNOSIS — I10 Essential (primary) hypertension: Secondary | ICD-10-CM

## 2013-07-14 DIAGNOSIS — K649 Unspecified hemorrhoids: Secondary | ICD-10-CM

## 2013-07-14 DIAGNOSIS — M545 Low back pain, unspecified: Secondary | ICD-10-CM

## 2013-07-14 DIAGNOSIS — M546 Pain in thoracic spine: Principal | ICD-10-CM

## 2013-07-14 DIAGNOSIS — M549 Dorsalgia, unspecified: Secondary | ICD-10-CM

## 2013-07-14 MED ORDER — MELOXICAM 15 MG PO TABS: 15.0000 mg | ORAL_TABLET | Freq: Every day | ORAL | Status: DC

## 2013-07-14 NOTE — Assessment & Plan Note (Signed)
BP Readings from Last 3 Encounters:  07/14/13 156/99  06/28/13 146/90  06/26/13 160/90    Lab Results  Component Value Date   NA 137 06/09/2013   K 3.9 06/09/2013   CREATININE 0.84 06/09/2013    Assessment: Blood pressure control: mildly elevated Progress toward BP goal:  unchanged  Plan: Medications:  continue current medications She needs to lose weight.

## 2013-07-14 NOTE — Patient Instructions (Addendum)
Thank you for your visit. - We have made an appointment with a different sports [place] about your back pain. Here is the information: [facility] [place] [place], [place] [address] Phone: [phone] Fax: [phone] - I prescribed you the highest dose of [facility] for your back pain. Please try taking this medicine everyday and see if it helps. - I really also encourage weight loss. This is probably the best thing you can do to help your back pain. - Please followup with the GI doctors and your asthma doctor. Even if you feel well, it is important that you followup to these appointments. - Please return to see me in 3 months.

## 2013-07-14 NOTE — Progress Notes (Signed)
Subjective:    Patient ID: April [name], female    DOB: 05/08/1970, 43 y.o.   MRN: [id number]  HPI  April [name] is a 43 y.o. female with a history of chronic severe asthma, obesity, HTN, presenting for follow up of back pain.  Patient reports back pain for about 2 months. She had this once before many years ago, prior to her breast reduction surgery. She says she lost weight after the surgery, and her back pain got better. Now it is back. Denies bowel or bladder dysfunction. Denies trauma. X-ray of the thoracic spine showed no acute bony abnormality in 01/15.   A few visits ago, I referred her to sports medicine and also got her linked in with physical therapy. Unfortunately, she no-showed to her sports medicine appointment with Dr. Micheline Chapman. Because she had previously no-showed 8 times at the [facility] for other issues, they will now refuse to see her. She also did not pick up her phone for physical therapy scheduling, so that appointment never happened. She is asking me for Percocet for pain control.  She was seen in our clinic several times and also in the ED recently for hemorrhoidal pain. She says this has resolved, and denies bloody stools.   For her asthma she has an appointment with Dr. Annamaria Boots on 3/20.    Current Outpatient Prescriptions on File Prior to Visit  Medication Sig Dispense Refill  . albuterol (PROVENTIL HFA;VENTOLIN HFA) 108 (90 BASE) MCG/ACT inhaler Inhale 2 puffs into the lungs every 4 (four) hours as needed for wheezing or shortness of breath.  1 Inhaler  3  . albuterol (PROVENTIL) (2.5 MG/3ML) 0.083% nebulizer solution Take 3 mLs (2.5 mg total) by nebulization every 4 (four) hours as needed for wheezing or shortness of breath.  30 vial  11  . docusate sodium (COLACE) 100 MG capsule Take 100 mg by mouth daily as needed for mild constipation.      . hydrochlorothiazide (MICROZIDE) 12.5 MG capsule Take 1 capsule (12.5 mg total) by mouth daily.  30 capsule  11  . hydrocortisone  (ANUCORT-HC) 25 MG suppository Place 1 suppository (25 mg total) rectally 2 (two) times daily.  12 suppository  0  . hydrocortisone (ANUSOL-HC) 2.5 % rectal cream Apply rectally 2 times daily  30 g  1  . hydrocortisone-pramoxine (PROCTOFOAM HC) rectal foam Place 1 applicator rectally 2 (two) times daily.  10 g  1  . ipratropium (ATROVENT) 0.02 % nebulizer solution Take 2.5 mLs (0.5 mg total) by nebulization every 4 (four) hours as needed for wheezing or shortness of breath.  75 mL  11  . mometasone-formoterol (DULERA) 100-5 MCG/ACT AERO Inhale 2 puffs into the lungs 2 (two) times daily.      [name] omalizumab (XOLAIR) 150 MG injection Inject 225 mg into the skin every 14 (fourteen) days. Had inj last 06-18-12      . omeprazole (PRILOSEC) 20 MG capsule Take 20 mg by mouth daily as needed (for heartburn).       [name] oxyCODONE-acetaminophen (PERCOCET) 10-325 MG per tablet Take 1 tablet by mouth every 8 (eight) hours as needed for pain. For back pain  10 tablet  0  . predniSONE (DELTASONE) 20 MG tablet Take 3 tablets (60 mg total) by mouth daily with breakfast.  21 tablet  0  . psyllium (METAMUCIL SMOOTH TEXTURE) 28 % packet Take 1-2 packets by mouth 2 (two) times daily. Take immediately after largest meal of the day.  60 packet  3    Review of Systems Constitutional: Negative for fever and chills.  HENT: Negative for rhinorrhea.  Eyes: Negative for visual disturbance.  Respiratory: Denies shortness of breath. Negative for chest tightness.  Cardiovascular: Negative for chest pain.  Gastrointestinal: Negative for abdominal pain.  Genitourinary: Negative for dysuria.  Musculoskeletal: Positive for back pain.  Skin: Negative for rash.  Neurological: Negative for syncope, weakness and numbness.  Psychiatric/Behavioral: Negative for dysphoric mood.      Objective:   Physical Exam  Constitutional: She is oriented to person, place, and time. She appears well-developed.  HENT:  Head: Normocephalic and  atraumatic.  Eyes: Conjunctivae and EOM are normal. Pupils are equal, round, and reactive to light.  Neck: Normal range of motion. Neck supple.  Cardiovascular: Normal rate, regular rhythm and normal heart sounds.  Exam reveals no gallop and no friction rub.   No murmur heard. Pulmonary/Chest: Effort normal and breath sounds normal. No respiratory distress. She has no wheezes. She has no rales. She exhibits no tenderness.  Abdominal: Soft. She exhibits no distension. There is no tenderness.  Musculoskeletal: Normal range of motion. She exhibits no edema and no tenderness.  Neurological: She is alert and oriented to person, place, and time. No cranial nerve deficit.  Skin: Skin is warm and dry. She is not diaphoretic.  Psychiatric: She has a normal mood and affect.   Filed Vitals:   07/14/13 1553  BP: 156/99  Pulse: 98  Temp: 98.3 F (36.8 C)      Assessment & Plan:   Please see problem-based charting.

## 2013-07-14 NOTE — Assessment & Plan Note (Addendum)
She says this has resolved, and denies bloody stools. She was prescribed 3 separate steroid creams/suppositories last month. Steroid cream for hemorrhoids should be applied twice a day for no more than seven days due to potential thinning of perianal and anal mucosa and increasing risk of injury.  - Patient was told to discontinue this - She was encouraged to attend her GI appointment even if she is asymptomatic, as they can give suggestions on how to prevent recurrence

## 2013-07-14 NOTE — Assessment & Plan Note (Addendum)
Because of chronic no-shows, patient has been banned from the sports medicine clinic here. Since she has insurance, we were able to arrange an appointment at the [facility] [place]. She also did not followup with her physical therapy appointment. I do not think MRI is indicated at this time, pain has only been present for 2 months and there are no red flag symptoms. Patient is requesting Percocet, but I will not prescribe this given recent hemorrhoids. She needs to lose weight. Body mass index is 57.39 kg/(m^2). - Patient was strongly encouraged to attend her sports medicine appointment at the new clinic, it has been scheduled and referral has been placed - Discussed weight loss as quite possibly the best thing to help her back pain - Prescribed MOBIC 15 mg daily x30 days, but patient noted she did not think she will take this as Percocet is the only thing that helps - I would not prescribe narcotics in this patient

## 2013-07-14 NOTE — Assessment & Plan Note (Addendum)
No acute exacerbation at this time. Patient denies shortness of breath. There is no wheezing on exam. History of medication noncompliance. Due to her insurance, she does not qualify for [facility] or [facility]. The only manufacturer program that was available for Ms. Funderburke was for the Proventil HFA, Edwena Blow (CSW) sent her an application for this. - Followup with Dr. Annamaria Boots on 07/16/2013 - She is due for a Xolair injection in their clinic  - Continue home Dulera, 2 puffs per day (LABA/ICS)  - Continue albuterol inhaler PRN  - Continue duo nebs - She was encouraged to measure her peak flows regularly, write them down, and bring them with her to her pulmonologist later this week  - Avoid cigarette smoke exposure

## 2013-07-15 NOTE — Progress Notes (Signed)
Case discussed with Dr. Cater at the time of the visit.  We reviewed the resident's history and exam and pertinent patient test results.  I agree with the assessment, diagnosis and plan of care documented in the resident's note. 

## 2013-07-16 ENCOUNTER — Ambulatory Visit: Payer: Commercial Managed Care - PPO

## 2013-07-17 ENCOUNTER — Encounter (HOSPITAL_COMMUNITY): Payer: Self-pay | Admitting: Emergency Medicine

## 2013-07-17 ENCOUNTER — Emergency Department (HOSPITAL_COMMUNITY): Payer: Commercial Managed Care - PPO

## 2013-07-17 ENCOUNTER — Emergency Department (HOSPITAL_COMMUNITY)
Admission: EM | Admit: 2013-07-17 | Discharge: 2013-07-17 | Disposition: A | Discharge status: Home / Self Care | Payer: Commercial Managed Care - PPO | Attending: Emergency Medicine | Admitting: Emergency Medicine

## 2013-07-17 ENCOUNTER — Other Ambulatory Visit: Payer: Self-pay

## 2013-07-17 DIAGNOSIS — I10 Essential (primary) hypertension: Secondary | ICD-10-CM | POA: Insufficient documentation

## 2013-07-17 DIAGNOSIS — Z87891 Personal history of nicotine dependence: Secondary | ICD-10-CM | POA: Insufficient documentation

## 2013-07-17 DIAGNOSIS — Z79899 Other long term (current) drug therapy: Secondary | ICD-10-CM | POA: Insufficient documentation

## 2013-07-17 DIAGNOSIS — Z872 Personal history of diseases of the skin and subcutaneous tissue: Secondary | ICD-10-CM | POA: Insufficient documentation

## 2013-07-17 DIAGNOSIS — IMO0002 Reserved for concepts with insufficient information to code with codable children: Secondary | ICD-10-CM | POA: Insufficient documentation

## 2013-07-17 DIAGNOSIS — J441 Chronic obstructive pulmonary disease with (acute) exacerbation: Secondary | ICD-10-CM | POA: Insufficient documentation

## 2013-07-17 DIAGNOSIS — Z8742 Personal history of other diseases of the female genital tract: Secondary | ICD-10-CM | POA: Insufficient documentation

## 2013-07-17 DIAGNOSIS — Z8619 Personal history of other infectious and parasitic diseases: Secondary | ICD-10-CM | POA: Insufficient documentation

## 2013-07-17 DIAGNOSIS — Z8659 Personal history of other mental and behavioral disorders: Secondary | ICD-10-CM | POA: Insufficient documentation

## 2013-07-17 DIAGNOSIS — J45901 Unspecified asthma with (acute) exacerbation: Secondary | ICD-10-CM

## 2013-07-17 DIAGNOSIS — Z791 Long term (current) use of non-steroidal anti-inflammatories (NSAID): Secondary | ICD-10-CM | POA: Insufficient documentation

## 2013-07-17 DIAGNOSIS — K219 Gastro-esophageal reflux disease without esophagitis: Secondary | ICD-10-CM | POA: Insufficient documentation

## 2013-07-17 LAB — CBC WITH DIFFERENTIAL/PLATELET
BASOS ABS: 0 10*3/uL (ref 0.0–0.1)
Basophils Relative: 0 % (ref 0–1)
EOS PCT: 3 % (ref 0–5)
Eosinophils Absolute: 0.3 10*3/uL (ref 0.0–0.7)
HEMATOCRIT: 36.1 % (ref 36.0–46.0)
Hemoglobin: 11.9 g/dL — ABNORMAL LOW (ref 12.0–15.0)
LYMPHS PCT: 22 % (ref 12–46)
Lymphs Abs: 2.4 10*3/uL (ref 0.7–4.0)
MCH: 27.6 pg (ref 26.0–34.0)
MCHC: 33 g/dL (ref 30.0–36.0)
MCV: 83.8 fL (ref 78.0–100.0)
MONO ABS: 1.3 10*3/uL — AB (ref 0.1–1.0)
Monocytes Relative: 12 % (ref 3–12)
Neutro Abs: 7.2 10*3/uL (ref 1.7–7.7)
Neutrophils Relative %: 64 % (ref 43–77)
Platelets: 463 10*3/uL — ABNORMAL HIGH (ref 150–400)
RBC: 4.31 MIL/uL (ref 3.87–5.11)
RDW: 15.9 % — ABNORMAL HIGH (ref 11.5–15.5)
WBC: 11.3 10*3/uL — ABNORMAL HIGH (ref 4.0–10.5)

## 2013-07-17 LAB — COMPREHENSIVE METABOLIC PANEL
ALT: 11 U/L (ref 0–35)
AST: 17 U/L (ref 0–37)
Albumin: 3.1 g/dL — ABNORMAL LOW (ref 3.5–5.2)
Alkaline Phosphatase: 62 U/L (ref 39–117)
BUN: 6 mg/dL (ref 6–23)
CALCIUM: 8.8 mg/dL (ref 8.4–10.5)
CO2: 22 mEq/L (ref 19–32)
CREATININE: 0.76 mg/dL (ref 0.50–1.10)
Chloride: 101 mEq/L (ref 96–112)
GFR calc Af Amer: >90 mL/min (ref >90)
Glucose, Bld: 90 mg/dL (ref 70–99)
Potassium: 3.9 mEq/L (ref 3.7–5.3)
Sodium: 136 mEq/L — ABNORMAL LOW (ref 137–147)
Total Bilirubin: 0.2 mg/dL — ABNORMAL LOW (ref 0.3–1.2)
Total Protein: 7 g/dL (ref 6.0–8.3)

## 2013-07-17 MED ORDER — ALBUTEROL SULFATE HFA 108 (90 BASE) MCG/ACT IN AERS
2.0000 | INHALATION_SPRAY | RESPIRATORY_TRACT | Status: DC | PRN
  Administered 2013-07-17: 2 via RESPIRATORY_TRACT
  Filled 2013-07-17: qty 6.7

## 2013-07-17 MED ORDER — DEXAMETHASONE SODIUM PHOSPHATE 10 MG/ML IJ SOLN
10.0000 mg | Freq: Once | INTRAMUSCULAR | Status: AC
  Administered 2013-07-17: 10 mg via INTRAMUSCULAR
  Filled 2013-07-17: qty 1

## 2013-07-17 MED ORDER — ALBUTEROL SULFATE (2.5 MG/3ML) 0.083% IN NEBU: 2.5000 mg | INHALATION_SOLUTION | Freq: Four times a day (QID) | RESPIRATORY_TRACT | Status: DC | PRN

## 2013-07-17 MED ORDER — ALBUTEROL SULFATE (2.5 MG/3ML) 0.083% IN NEBU
5.0000 mg | INHALATION_SOLUTION | Freq: Once | RESPIRATORY_TRACT | Status: AC
  Administered 2013-07-17: 5 mg via RESPIRATORY_TRACT
  Filled 2013-07-17: qty 6

## 2013-07-17 MED ORDER — IPRATROPIUM BROMIDE 0.02 % IN SOLN
0.5000 mg | Freq: Once | RESPIRATORY_TRACT | Status: AC
  Administered 2013-07-17: 0.5 mg via RESPIRATORY_TRACT
  Filled 2013-07-17: qty 2.5

## 2013-07-17 NOTE — ED Notes (Addendum)
Pt presents to department for evaluation of SOB. History of asthma, no relief with nebulizer treatments at home. States SOB increases with exertion. Respirations unlabored. Speaking complete sentences. Pt is conscious alert and oriented x4. Pt also states near syncope. States she feels like she is going to pass out.

## 2013-07-17 NOTE — ED Provider Notes (Signed)
CSN: [id number]     Arrival date & time 07/17/13  1603 History   First MD Initiated Contact with Patient 07/17/13 1903     Chief Complaint  Patient presents with  . Shortness of Breath    (Consider location/radiation/quality/duration/timing/severity/associated sxs/prior Treatment) HPI Comments: Patient with history of asthma presents with complaint of worsening shortness of breath, wheezing, and cough for the past day. Symptoms are gradually worsening. She denies fever, production of mucus with cough, URI symptoms. She thinks that this was triggered by new back pain medication. She will get coughing fits that make her feel like passing out. She denies chest pain. Patient denies risk factors for pulmonary embolism including: unilateral leg swelling, history of DVT/PE/other blood clots, use of estrogens, recent immobilizations, recent surgery, recent travel (>4hr segment), malignancy, hemoptysis. Patient has been using home albuterol nebs up to every 2 hours. Patient received breathing treatment upon arrival to the emergency department and notes improvement.   Patient is a 43 y.o. female presenting with shortness of breath. The history is provided by the patient and medical records.  Shortness of Breath Associated symptoms: cough and wheezing   Associated symptoms: no abdominal pain, no chest pain, no fever, no headaches, no rash, no sore throat and no vomiting     Past Medical History  Diagnosis Date  . Acanthosis nigricans   . Menorrhagia   . Hypertension, essential   . Insomnia   . Morbid obesity   . Helicobacter pylori (H. pylori) infection   . Sleep apnea     Sleep study 2008 - mild OSA, not enough events to titrate CPAP  . COPD (chronic obstructive pulmonary disease)     PFTs in 2002, FEV1/FVC 65, no post bronchodilater test done  . Asthma     Followed by Dr. Annamaria Boots (pulmonology); receives every other week omalizumab injections; has frequent exacerbations  . Depression   . GERD  (gastroesophageal reflux disease)   . Tobacco user   . Obesity    Past Surgical History  Procedure Laterality Date  . Tubal ligation  1996    bilateral  . Breast reduction surgery  09/2011   Family History  Problem Relation Age of Onset  . Asthma Daughter   . Cancer Paternal Aunt   . Asthma Maternal Grandmother   . Hypertension Mother    History  Substance Use Topics  . Smoking status: Former Smoker -- 0.25 packs/day for 9 years    Types: Cigarettes    Quit date: 10/04/2012  . Smokeless tobacco: Never Used  . Alcohol Use: No   OB History   Grav Para Term Preterm Abortions TAB SAB Ect Mult Living                 Review of Systems  Constitutional: Negative for fever.  HENT: Negative for rhinorrhea and sore throat.   Eyes: Negative for redness.  Respiratory: Positive for cough, shortness of breath and wheezing.   Cardiovascular: Negative for chest pain and leg swelling.  Gastrointestinal: Negative for nausea, vomiting, abdominal pain and diarrhea.  Genitourinary: Negative for dysuria.  Musculoskeletal: Negative for myalgias.  Skin: Negative for rash.  Neurological: Negative for headaches.    Allergies  Ace inhibitors and Percocet  Home Medications   Current Outpatient Rx  Name  Route  Sig  Dispense  Refill  . albuterol (PROVENTIL HFA;VENTOLIN HFA) 108 (90 BASE) MCG/ACT inhaler   Inhalation   Inhale 2 puffs into the lungs every 4 (four) hours as needed for  wheezing or shortness of breath.   1 Inhaler   3   . albuterol (PROVENTIL) (2.5 MG/3ML) 0.083% nebulizer solution   Nebulization   Take 3 mLs (2.5 mg total) by nebulization every 4 (four) hours as needed for wheezing or shortness of breath.   30 vial   11   . diphenhydrAMINE (BENADRYL) 25 MG tablet   Oral   Take 25 mg by mouth every 6 (six) hours as needed for itching (with percocet).         [name] docusate sodium (COLACE) 100 MG capsule   Oral   Take 100 mg by mouth daily as needed for mild  constipation.         . hydrochlorothiazide (MICROZIDE) 12.5 MG capsule   Oral   Take 1 capsule (12.5 mg total) by mouth daily.   30 capsule   11   . ipratropium (ATROVENT) 0.02 % nebulizer solution   Nebulization   Take 2.5 mLs (0.5 mg total) by nebulization every 4 (four) hours as needed for wheezing or shortness of breath.   75 mL   11   . meloxicam (MOBIC) 15 MG tablet   Oral   Take 1 tablet (15 mg total) by mouth daily.   30 tablet   0   . mometasone-formoterol (DULERA) 100-5 MCG/ACT AERO   Inhalation   Inhale 2 puffs into the lungs 2 (two) times daily.         [name] omalizumab (XOLAIR) 150 MG injection   Subcutaneous   Inject 225 mg into the skin every 14 (fourteen) days. Had inj last 06-18-12         . omeprazole (PRILOSEC) 20 MG capsule   Oral   Take 20 mg by mouth daily as needed (for heartburn).          [name] oxyCODONE-acetaminophen (PERCOCET) 10-325 MG per tablet   Oral   Take 1 tablet by mouth every 8 (eight) hours as needed for pain. For back pain   10 tablet   0   . psyllium (METAMUCIL SMOOTH TEXTURE) 28 % packet   Oral   Take 1-2 packets by mouth 2 (two) times daily. Take immediately after largest meal of the day.   60 packet   3   . EXPIRED: diphenhydrAMINE (BENADRYL) 25 MG tablet   Oral   Take 1 tablet (25 mg total) by mouth every 6 (six) hours as needed for itching.   30 tablet   0    BP 134/65  Pulse 90  Temp(Src) 99.7 F (37.6 C) (Oral)  Resp 24  SpO2 99%  LMP 07/02/2013 Physical Exam  Nursing note and vitals reviewed. Constitutional: She appears well-developed and well-nourished.  HENT:  Head: Normocephalic and atraumatic.  Mouth/Throat: Oropharynx is clear and moist.  Eyes: Conjunctivae are normal. Right eye exhibits no discharge. Left eye exhibits no discharge.  Neck: Normal range of motion. Neck supple. No tracheal deviation present.  Cardiovascular: Normal rate, regular rhythm and normal heart sounds.   Pulmonary/Chest:  Effort normal. She has wheezes (mild, expiratory, scattered).  Abdominal: Soft. There is no tenderness.  Morbidly obese  Musculoskeletal: She exhibits no tenderness.  Neurological: She is alert.  Skin: Skin is warm and dry.  Psychiatric: She has a normal mood and affect.    ED Course  Procedures (including critical care time) Labs Review Labs Reviewed  CBC WITH DIFFERENTIAL - Abnormal; Notable for the following:    WBC 11.3 (*)    Hemoglobin 11.9 (*)  RDW 15.9 (*)    Platelets 463 (*)    Monocytes Absolute 1.3 (*)    All other components within normal limits  COMPREHENSIVE METABOLIC PANEL - Abnormal; Notable for the following:    Sodium 136 (*)    Albumin 3.1 (*)    Total Bilirubin 0.2 (*)    All other components within normal limits   Imaging Review Dg Chest 2 View  07/17/2013   CLINICAL DATA:  Shortness of breath, cough, and wheezing for 3 days, history asthma, hypertension, COPD, former smoker  EXAM: CHEST  2 VIEW  COMPARISON:  06/06/2013  FINDINGS: Upper normal heart size.  Mediastinal contours and pulmonary vascularity normal.  Peribronchial thickening, chronic.  No definite infiltrate, pleural effusion or pneumothorax.  Bones unremarkable.  IMPRESSION: Mild chronic bronchitic changes.  No acute abnormalities.   Electronically Signed   By: Lavonia Dana M.D.   On: 07/17/2013 19:02     EKG Interpretation None      7:39 PM Patient seen and examined. Work-up initiated. Medications ordered.   Vital signs reviewed and are as follows: Filed Vitals:   07/17/13 1931  BP: 134/65  Pulse: 90  Temp:   Resp: 24   9:13 PM patient more improved after second breathing treatment. She states she is feeling better and wants to go home. Will discharge home with albuterol inhaler, albuterol for her home nebulizer. She has been given IM steroids per her request.  On reexam, only mild expiratory wheezing remains.  Encouraged PCP followup with return if worsening.   MDM   Final  diagnoses:  Asthma exacerbation   Patient with asthma exacerbation, improved in emergency department with several breathing treatments. Chest x-ray is negative. She does not have any evidence of infection on exam. No respiratory distress. She has been given steroids.  No dangerous or life-threatening conditions suspected or identified by history, physical exam, and by work-up. No indications for hospitalization identified.     Carlisle Cater, PA-C 07/17/13 2114

## 2013-07-17 NOTE — Discharge Instructions (Signed)
Please read and follow all provided instructions.  Your diagnoses today include:  1. Asthma exacerbation     Tests performed today include:  Chest x-ray - no pneumonia  Blood counts and electrolytes  Vital signs. See below for your results today.   Medications prescribed:   Albuterol inhaler - medication that opens up your airway  Use inhaler as follows: 1-2 puffs with spacer every 4 hours as needed for wheezing, cough, or shortness of breath.   Take any prescribed medications only as directed.  Home care instructions:  Follow any educational materials contained in this packet.  Follow-up instructions: Please follow-up with your primary care provider in the next 3 days for further evaluation of your symptoms and management of your asthma. If you do not have a primary care doctor -- see below for referral information.   Return instructions:   Please return to the Emergency Department if you experience worsening symptoms.  Please return with worsening wheezing, shortness of breath, or difficulty breathing.  Return with persistent fever above 101F.   Please return if you have any other emergent concerns.  Additional Information:  Your vital signs today were: BP 93/76   Pulse 94   Temp(Src) 99.7 F (37.6 C) (Oral)   Resp 36   SpO2 100%   LMP 07/02/2013 If your blood pressure (BP) was elevated above 135/85 this visit, please have this repeated by your doctor within one month. --------------

## 2013-07-17 NOTE — ED Notes (Signed)
PT transported to xray.

## 2013-07-17 NOTE — ED Notes (Signed)
Pt to nurses station stating I feel like I need a breathing tx. Pt taken back to reassess. o2 stats 100% on RA. Pt with wheezes throughout.

## 2013-07-18 NOTE — ED Provider Notes (Signed)
Medical screening examination/treatment/procedure(s) were performed by non-physician practitioner and as supervising physician I was immediately available for consultation/collaboration.  Richarda Blade, MD 07/18/13 [id number]

## 2013-07-19 ENCOUNTER — Ambulatory Visit (INDEPENDENT_AMBULATORY_CARE_PROVIDER_SITE_OTHER): Payer: Commercial Managed Care - PPO

## 2013-07-19 DIAGNOSIS — J45909 Unspecified asthma, uncomplicated: Secondary | ICD-10-CM

## 2013-07-21 ENCOUNTER — Ambulatory Visit (INDEPENDENT_AMBULATORY_CARE_PROVIDER_SITE_OTHER): Payer: Commercial Managed Care - PPO | Admitting: Internal Medicine

## 2013-07-21 ENCOUNTER — Telehealth: Payer: Self-pay | Admitting: *Deleted

## 2013-07-21 ENCOUNTER — Encounter: Payer: Self-pay | Admitting: Internal Medicine

## 2013-07-21 VITALS — BP 164/91 | HR 83 | Temp 99.1°F | Ht 67.0 in | Wt 366.0 lb

## 2013-07-21 DIAGNOSIS — J309 Allergic rhinitis, unspecified: Secondary | ICD-10-CM

## 2013-07-21 DIAGNOSIS — J45901 Unspecified asthma with (acute) exacerbation: Secondary | ICD-10-CM

## 2013-07-21 MED ORDER — PREDNISONE 20 MG PO TABS: ORAL_TABLET | ORAL | Status: DC

## 2013-07-21 MED ORDER — CHLORPHENIRAMINE MALEATE 4 MG PO TABS: 4.0000 mg | ORAL_TABLET | Freq: Two times a day (BID) | ORAL | Status: DC | PRN

## 2013-07-21 NOTE — Progress Notes (Signed)
   Subjective:    Patient ID: April [name], female    DOB: 1970-06-21, 42 y.o.   MRN: [id number]  HPI  Pt with hx significant for poorly controlled asthma managed via Pulmonologist Dr. Annamaria Boots with omalizumab injection bi weekly.  She presented to ED with complaints of shortness of breath with wheezing and was treated for for asthma exacerbation with nebulized bronchodilators and IV solumedrol with resolution of symptoms after 2 treatments. She presents to clinic for prednisone stating that she was not given a prescription and she can feel that her breathing is starting to worsen again.  States that she is using her nebulizer "a lot" and is also having congestion.  Denies cough or fever.  Review of Systems  Constitutional: Negative for fever.  HENT: Positive for postnasal drip and rhinorrhea. Negative for sinus pressure.   Respiratory: Positive for shortness of breath and wheezing. Negative for cough.   Cardiovascular: Negative for chest pain.  Gastrointestinal: Negative.   Endocrine: Negative.   Skin: Negative.   Allergic/Immunologic: Positive for environmental allergies.  Neurological: Negative.   Psychiatric/Behavioral: Negative.        Objective:   Physical Exam  Constitutional: She is oriented to person, place, and time. She appears well-developed and well-nourished. No distress.  obese  HENT:  Head: Normocephalic and atraumatic.  Right Ear: Hearing, tympanic membrane, external ear and ear canal normal.  Left Ear: Hearing and external ear normal.  Nose: Mucosal edema and rhinorrhea present. No sinus tenderness.  Mouth/Throat: Oropharynx is clear and moist.  Left canal with cerumen  Eyes: Conjunctivae and EOM are normal. Pupils are equal, round, and reactive to light.  Neck: Normal range of motion. Neck supple. No thyromegaly present.  Cardiovascular: Normal rate, regular rhythm, normal heart sounds and intact distal pulses.   Pulmonary/Chest: Effort normal and breath sounds  normal. No respiratory distress. She has no wheezes. She has no rales.  Abdominal: Soft. Bowel sounds are normal.  Neurological: She is alert and oriented to person, place, and time.  Skin: Skin is warm and dry.  Psychiatric: She has a normal mood and affect.          Assessment & Plan:  See separate problem-list charting for detailed A/P:  -prednisone x 6 days -cont omalizumab injections -reminded to do peak flows at home -chlorphenamine for rhinorrhea

## 2013-07-21 NOTE — Progress Notes (Signed)
Peak flow x 1 - 260. Dr Michail Sermon aware. Hilda Blades Alexi Dorminey RN 07/21/13 4PM

## 2013-07-21 NOTE — Patient Instructions (Signed)
We have prescribed a medication for your congestion. Continue to use your inhalers as instructed and keep your asthma injection appointments. Follow-up with your PCP.  Please bring your medicines with you each time you come.   Medicines may be  Eye drops  Herbal   Vitamins  Pills  Seeing these help [place] take care of you.   Asthma Attack Prevention Although there is no way to prevent asthma from starting, you can take steps to control the disease and reduce its symptoms. Learn about your asthma and how to control it. Take an active role to control your asthma by working with your health care provider to create and follow an asthma action plan. An asthma action plan guides you in:  Taking your medicines properly.  Avoiding things that set off your asthma or make your asthma worse (asthma triggers).  Tracking your level of asthma control.  Responding to worsening asthma.  Seeking emergency care when needed. To track your asthma, keep records of your symptoms, check your peak flow number using a handheld device that shows how well air moves out of your lungs (peak flow meter), and get regular asthma checkups.  WHAT ARE SOME WAYS TO PREVENT AN ASTHMA ATTACK?  Take medicines as directed by your health care provider.  Keep track of your asthma symptoms and level of control.  With your health care provider, write a detailed plan for taking medicines and managing an asthma attack. Then be sure to follow your action plan. Asthma is an ongoing condition that needs regular monitoring and treatment.  Identify and avoid asthma triggers. Many outdoor allergens and irritants (such as pollen, mold, cold air, and air pollution) can trigger asthma attacks. Find out what your asthma triggers are and take steps to avoid them.  Monitor your breathing. Learn to recognize warning signs of an attack, such as coughing, wheezing, or shortness of breath. Your lung function may decrease before you notice  any signs or symptoms, so regularly measure and record your peak airflow with a home peak flow meter.  Identify and treat attacks early. If you act quickly, you are less likely to have a severe attack. You will also need less medicine to control your symptoms. When your peak flow measurements decrease and alert you to an upcoming attack, take your medicine as instructed and immediately stop any activity that may have triggered the attack. If your symptoms do not improve, get medical help.  Pay attention to increasing quick-relief inhaler use. If you find yourself relying on your quick-relief inhaler, your asthma is not under control. See your health care provider about adjusting your treatment. WHAT CAN MAKE MY SYMPTOMS WORSE? A number of common things can set off or make your asthma symptoms worse and cause temporary increased inflammation of your airways. Keep track of your asthma symptoms for several weeks, detailing all the environmental and emotional factors that are linked with your asthma. When you have an asthma attack, go back to your asthma diary to see which factor, or combination of factors, might have contributed to it. Once you know what these factors are, you can take steps to control many of them. If you have allergies and asthma, it is important to take asthma prevention steps at home. Minimizing contact with the substance to which you are allergic will help prevent an asthma attack. Some triggers and ways to avoid these triggers are: Animal Dander:  Some people are allergic to the flakes of skin or dried saliva from animals  with fur or feathers.   There is no such thing as a hypoallergenic dog or cat breed. All dogs or cats can cause allergies, even if they don't shed.  Keep these pets out of your home.  If you are not able to keep a pet outdoors, keep the pet out of your bedroom and other sleeping areas at all times, and keep the door closed.  Remove carpets and furniture covered  with cloth from your home. If that is not possible, keep the pet away from fabric-covered furniture and carpets. Dust Mites: Many people with asthma are allergic to dust mites. Dust mites are tiny bugs that are found in every home in mattresses, pillows, carpets, fabric-covered furniture, bedcovers, clothes, stuffed toys, and other fabric-covered items.   Cover your mattress in a special dust-proof cover.  Cover your pillow in a special dust-proof cover, or wash the pillow each week in hot water. Water must be hotter than 130 F (54.4 C) to kill dust mites. Cold or warm water used with detergent and bleach can also be effective.  Wash the sheets and blankets on your bed each week in hot water.  Try not to sleep or lie on cloth-covered cushions.  Call ahead when traveling and ask for a smoke-free hotel room. Bring your own bedding and pillows in case the hotel only supplies feather pillows and down comforters, which may contain dust mites and cause asthma symptoms.  Remove carpets from your bedroom and those laid on concrete, if you can.  Keep stuffed toys out of the bed, or wash the toys weekly in hot water or cooler water with detergent and bleach. Cockroaches: Many people with asthma are allergic to the droppings and remains of cockroaches.   Keep food and garbage in closed containers. Never leave food out.  Use poison baits, traps, powders, gels, or paste (for example, boric acid).  If a spray is used to kill cockroaches, stay out of the room until the odor goes away. Indoor Mold:  Fix leaky faucets, pipes, or other sources of water that have mold around them.  Clean floors and moldy surfaces with a fungicide or diluted bleach.  Avoid using humidifiers, vaporizers, or swamp coolers. These can spread molds through the air. Pollen and Outdoor Mold:  When pollen or mold spore counts are high, try to keep your windows closed.  Stay indoors with windows closed from late morning to  afternoon. Pollen and some mold spore counts are highest at that time.  Ask your health care provider whether you need to take anti-inflammatory medicine or increase your dose of the medicine before your allergy season starts. Other Irritants to Avoid:  Tobacco smoke is an irritant. If you smoke, ask your health care provider how you can quit. Ask family members to quit smoking too. Do not allow smoking in your home or car.  If possible, do not use a wood-burning stove, kerosene heater, or fireplace. Minimize exposure to all sources of smoke, including to incense, candles, fires, and fireworks.  Try to stay away from strong odors and sprays, such as perfume, talcum powder, hair spray, and paints.  Decrease humidity in your home and use an indoor air cleaning device. Reduce indoor humidity to below 60%. Dehumidifiers or central air conditioners can do this.  Decrease house dust exposure by changing furnace and air cooler filters frequently.  Try to have someone else vacuum for you once or twice a week. Stay out of rooms while they are being vacuumed  and for a short while afterward.  If you vacuum, use a dust mask from a hardware store, a double-layered or microfilter vacuum cleaner bag, or a vacuum cleaner with a HEPA filter.  Sulfites in foods and beverages can be irritants. Do not drink beer or wine or eat dried fruit, processed potatoes, or shrimp if they cause asthma symptoms.  Cold air can trigger an asthma attack. Cover your nose and mouth with a scarf on cold or windy days.  Several health conditions can make asthma more difficult to manage, including a runny nose, sinus infections, reflux disease, psychological stress, and sleep apnea. Work with your health care provider to manage these conditions.  Avoid close contact with people who have a respiratory infection such as a cold or the flu, since your asthma symptoms may get worse if you catch the infection. Wash your hands thoroughly  after touching items that may have been handled by people with a respiratory infection.  Get a flu shot every year to protect against the flu virus, which often makes asthma worse for days or weeks. Also get a pneumonia shot if you have not previously had one. Unlike the flu shot, the pneumonia shot does not need to be given yearly. Medicines:  Talk to your health care provider about whether it is safe for you to take aspirin or non-steroidal anti-inflammatory medicines (NSAIDs). In a small number of people with asthma, aspirin and NSAIDs can cause asthma attacks. These medicines must be avoided by people who have known aspirin-sensitive asthma. It is important that people with aspirin-sensitive asthma read labels of all over-the-counter medicines used to treat pain, colds, coughs, and fever.  Beta blockers and ACE inhibitors are other medicines you should discuss with your health care provider. HOW CAN I FIND OUT WHAT I AM ALLERGIC TO? Ask your asthma health care provider about allergy skin testing or blood testing (the RAST test) to identify the allergens to which you are sensitive. If you are found to have allergies, the most important thing to do is to try to avoid exposure to any allergens that you are sensitive to as much as possible. Other treatments for allergies, such as medicines and allergy shots (immunotherapy) are available.  CAN I EXERCISE? Follow your health care provider's advice regarding asthma treatment before exercising. It is important to maintain a regular exercise program, but vigorous exercise, or exercise in cold, humid, or dry environments can cause asthma attacks, especially for those people who have exercise-induced asthma. Document Released: 04/03/2009 Document Revised: 12/16/2012 Document Reviewed: 10/21/2012 [facility] Patient Information 2014 [place].

## 2013-07-21 NOTE — Telephone Encounter (Signed)
Agree with appt. thanks

## 2013-07-21 NOTE — Assessment & Plan Note (Signed)
Boggy turbinates with clear rhinorrhea and total occlusion of the nasal passages bilaterally.  -chlorphenamine x 7 days

## 2013-07-21 NOTE — Assessment & Plan Note (Signed)
Recent ED visit with neb bronchodilators x2 and IV solumedrol.  No wheezes on exam today.  Pt reports congestion.  Peak flow in clinic today 270.  Baseline 350.  Reports compliance with asthma therapy including mometasone-formoterol, ipratropium, and albuterol inhalers.  States had Xolair injection 07/19/2013.  -will provide short course of prednisone -encourage home peak flow monitoring -f/u Dr. Annamaria Boots

## 2013-07-21 NOTE — Telephone Encounter (Signed)
Pt seen in ED on 3/21 for Asthma exacerbation.  She was given a steroid injection but none to take home.  Pt calls for Rx of prednisone.  She is not improved, SOB and using nebulizer frequently. Pt informed she needs reevaluation.  Will see today at 3:45 in clinic

## 2013-07-22 MED ORDER — OMALIZUMAB 150 MG ~~LOC~~ SOLR
225.0000 mg | Freq: Once | SUBCUTANEOUS | Status: AC
  Administered 2013-07-22: 225 mg via SUBCUTANEOUS

## 2013-07-27 NOTE — Progress Notes (Signed)
Case discussed with Dr. Schooler soon after the resident saw the patient.  We reviewed the resident's history and exam and pertinent patient test results.  I agree with the assessment, diagnosis, and plan of care documented in the resident's note. 

## 2013-08-02 ENCOUNTER — Encounter: Payer: Self-pay | Admitting: Internal Medicine

## 2013-08-02 ENCOUNTER — Ambulatory Visit: Payer: Commercial Managed Care - PPO | Admitting: Internal Medicine

## 2013-08-02 ENCOUNTER — Telehealth: Payer: Self-pay | Admitting: Internal Medicine

## 2013-08-02 ENCOUNTER — Ambulatory Visit: Payer: Commercial Managed Care - PPO

## 2013-08-02 VITALS — BP 144/82 | HR 88 | Ht 66.0 in | Wt 371.8 lb

## 2013-08-02 DIAGNOSIS — J45909 Unspecified asthma, uncomplicated: Secondary | ICD-10-CM

## 2013-08-02 DIAGNOSIS — J302 Other seasonal allergic rhinitis: Secondary | ICD-10-CM

## 2013-08-02 DIAGNOSIS — F17200 Nicotine dependence, unspecified, uncomplicated: Secondary | ICD-10-CM

## 2013-08-02 NOTE — Progress Notes (Signed)
Patient ID: April [name], female    DOB: [birth year], 43 y.o.   MRN: [id number]  HPI 66 yoF former smoker followed for chronic severe asthma. Hospitalized 4/11-12/12 for another exacerbation she says was triggered by catching a cold. Common triggers include colds and any smoke exposure. Back now to baseline. She has continued Xolair injection here every 2 weeks and does feel it has reduced frequency of flare-ups. Usual weight range 320-340 and she asks why weight is up to 360. Discussed steroids, including IV therapy at hospital but also ?role of Dulera 200-5. She is now being seen a [facility] anticipating breast reduction surgery and we talked about bariatric surgery. Takes omeprazole twice daily before meals, but has frequent heart burn.   04/12/11- 47 yoF former smoker followed for chronic severe asthma. Has had flu vaccine and pertussis vaccine. Says her breathing is okay. We had sent antibiotic for a cold with bronchitis around Thanksgiving. She denies any smoking at all now. She has avoided hospital partly because she has been better and partly because she is unwilling to go. Xolair has made a real improvement. She had breast reduction surgery without problems at [facility] and has considered bariatric surgery but can't afford it. Uses her nebulizer machine but not every day. Does continue Dulera and omeprazole. She is taking Ambien but still sleeps poorly at night. She no longer misses much work but that means she can't nap in the daytime. Complains of frequently waking at night but does not know if she snores.  04/25/11-  67 yoF former smoker followed for chronic severe asthma complicated by morbid obesity, HBP, depression. Her antidepressant was changed to Wellbutrin on December 18. Since then she can't sleep, using 3 Ambien per night. Increased asthma with wheezing. She increased to [facility] to 6 puffs daily. Had a cold at Thanksgiving but not since. She continues Xolair.  08/06/11- 24 yoF former  smoker followed for chronic severe asthma complicated by morbid obesity, HBP, depression, GERD. Change jobs and insurance. Had a lapse of 2 weeks in her Xolair therapy but ready to restart. Work environment is clear and cool, indoors. Watery rhinorrhea not helped by 3 intermittent use of Flonase. No acute asthma attack but uses her Dulera and rescue inhaler every night at bedtime. Ambien helps sleep.  10/02/12- 26 yoF former smoker followed for chronic severe asthma complicated by morbid obesity, HBP, depression, GERD. FOLLOWS FOR: had a few hosptial visits recently due to insurance and PA for [facility] to have medication on Tuesday 10-06-12 and can resume Xolair then. Continues to have trouble sleeping (falling and staying) and would like to go back on Ambien if possible. Throat itch x several days, dry cough. ER again 2 weeks ago. Off Xolair x 5 weeks but has insurance ok now to restart. She started some left over prednisone. Aware occasional reflux- discussed precautions. CXR 05/22/12 IMPRESSION:  No acute infiltrate or pulmonary edema. Mild perihilar  increased bronchial markings without focal consolidation.  Original Report Authenticated By: Lahoma Crocker, M.D.   11/09/2012 [facility] follow up  Recent admitted for Acute asthma flare. Tx w/ abx , steroids and nebs. Reports doing well since discharge.  finished prednisone and abx from hosp but currently on Cipro for UTI.  no new complaints; reports breathing is back to baseline.   Had restarted smoking but has quit . We discussed med compliance. No fever, chest pain, orthopnea, hemoptysis.  No edema.  11/09/2012 CXR Stable, with suspected artifactual density in the medial  right  upper lung related to summation. No acute cardiopulmonary  Abnormality.  01/15/13- 89 yoF former smoker followed for chronic severe asthma complicated by morbid obesity, HBP, depression, GERD. FOLLOWS FOR: patient is not working but has insurance-medications costly and  unable to afford at times. Had been getting Xolair shots regularly, but has had to skip while insurance is worked out. Has had flu vaccine. New problem-insomnia. Difficulty initiating and maintaining sleep not adequately helped by clonazepam. Irregular bedtime. Gets up between 4 and 5 AM. Previously used Ambien but blames it for hallucination and was using too much. Sleep study ordered 2012 but she cannot get it done CXR 12/22/12 IMPRESSION:  No acute cardiopulmonary disease. Specifically, no evidence of  pneumonia.  Original Report Authenticated By: [name], MD  04/08/13- 5 yoF former smoker followed for chronic severe asthma/ Xolair complicated by OSA/ CPAP,  morbid obesity, HBP, depression, GERD FOLLOWS FOR: still on Xolair; continues to wheeze alot. Feels like since taking clonazepam she wakes up with the feeling of not being able to breath. She describes waking wheezing and short of breath after taking clonazepam. It sounds as if she just sleeps deeply. Ambien caused hallucination when she was taking 2 or 3 at a time. She says she was "given" her CPAP machine at the hospital but with no followup instructions and no explanation as to how to use it.  NPSG 03/31/07- AHI 5.8/ hr, weight 330 lbs. She canceled planned more recent study.  08/02/13- 69 yoF former smoker followed for chronic severe asthma/ Xolair complicated by OSA/ CPAP,  morbid obesity, HBP, depression, GERD ACUTE VISIT: had Xolair today; having increased nasal congestion, wheezing and SOB,headaches-using nebulizers more than usual. Just ran out of Dulera. Visit on prednisone 20 mg daily ear he taking omeprazole only when needed. Aware of reflux at night. Plain spring pollen for increased nasal congestion. 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 CXR 3/21/5 IMPRESSION:  Mild chronic bronchitic changes.  No acute abnormalities.  Electronically Signed  By: Lavonia Dana M.D.  On: 07/17/2013 19:02  Review of Systems-See  HPI Constitutional:   No-   weight loss, night sweats, fevers, chills, +fatigue, lassitude. HEENT:   No-  headaches, difficulty swallowing, tooth/dental problems, sore throat,       + sneezing, itching, ear ache, +nasal congestion, post nasal drip,  CV:  No-   chest pain, orthopnea, PND, swelling in lower extremities, anasarca, dizziness, palpitations Resp: +shortness of breath with exertion or at rest.              No-   productive cough,  + non-productive cough,  No- coughing up of blood.              No-   change in color of mucus. +wheezing.   Skin: No-   rash or lesions. GI:  No-   heartburn, indigestion, abdominal pain, nausea, vomiting,  GU: MS:  No-   joint pain or swelling.   Neuro-     nothing unusual Psych:  No- change in mood or affect. No depression or anxiety.  No memory loss.    Objective:   Physical Exam General- Alert, Oriented, Affect-appropriate, Distress- none acute, + morbid obesity,  Skin- rash-none, lesions- none, excoriation- none Lymphadenopathy- none Head- atraumatic            Eyes- Gross vision intact, PERRLA, conjunctivae clear secretions            Ears- Hearing, canals-normal  Nose- Clear, no-Septal dev, mucus, polyps, erosion, perforation             Throat- Mallampati II , mucosa clear , drainage- none, tonsils- atrophic, + hoarse, no                      thrush Neck- flexible , trachea midline, no stridor , thyroid nl, carotid no bruit Chest - symmetrical excursion , unlabored           Heart/CV- RRR , no murmur , no gallop  , no rub, nl s1 s2                           - JVD- none , edema- none, stasis changes- none, varices- none           Lung- Diminished, unlabored.  Wheeze+ mild, cough+ light, dullness-none, rub- none           Chest wall-  Abd-  Br/ Gen/ Rectal- Not done, not indicated Extrem- cyanosis- none, clubbing, none, atrophy- none, strength- nl Neuro- grossly intact to observation

## 2013-08-02 NOTE — Telephone Encounter (Signed)
Spoke with pt.  She is here now to get xolair inj.  C/o nasal congestion, SOB, wheezing, and chest tightness.  Is using albuterol and atrovent nebs with relief for approx 1-2 hours.  No cough,f/c/s.  Pt is requesting to be seen today if possible, would like to discuss nasal spray, and dulera sample.    I spoke with Dr. Annamaria Boots.  He will work pt in now.  Pt aware.

## 2013-08-02 NOTE — Patient Instructions (Signed)
We can continue Xolair  Recommend daily omeprazole before breakfast, every day. If you can't afford it, try pepcid or Zantac/ ranitadine  Sample Dulera 200    2 puffs then rinse mouth, twice daily  Sample Dymista nasal spray   1-2 puffs each nostril once daily at bedtime

## 2013-08-02 NOTE — Telephone Encounter (Signed)
Called and spoke with pt and she stated that she has been having problems with her allergies.  She stated that she is having lots of head congestion but is not able to get any of the congestion out.  She has been using her neb machine daily.  She stated that she is out of the dulera and she is not able to afford this and would like a sample for now.  She stated that she would also like to try a nasal spray to see if this would help with the allergies.  CY please advise. Thanks  Last ov--04/08/2013 Next ov--no pending appts  Allergies  Allergen Reactions  . Ace Inhibitors Other (See Comments)     Cough  . Percocet [Oxycodone-Acetaminophen] Itching    Ok with benadryl     Current Outpatient Prescriptions on File Prior to Visit  Medication Sig Dispense Refill  . albuterol (PROVENTIL HFA;VENTOLIN HFA) 108 (90 BASE) MCG/ACT inhaler Inhale 2 puffs into the lungs every 4 (four) hours as needed for wheezing or shortness of breath.  1 Inhaler  3  . albuterol (PROVENTIL) (2.5 MG/3ML) 0.083% nebulizer solution Take 3 mLs (2.5 mg total) by nebulization every 4 (four) hours as needed for wheezing or shortness of breath.  30 vial  11  . albuterol (PROVENTIL) (2.5 MG/3ML) 0.083% nebulizer solution Take 3 mLs (2.5 mg total) by nebulization every 6 (six) hours as needed for wheezing or shortness of breath.  75 mL  1  . chlorpheniramine (CHLOR-TRIMETON) 4 MG tablet Take 1 tablet (4 mg total) by mouth 2 (two) times daily as needed for allergies.  14 tablet  0  . diphenhydrAMINE (BENADRYL) 25 MG tablet Take 1 tablet (25 mg total) by mouth every 6 (six) hours as needed for itching.  30 tablet  0  . diphenhydrAMINE (BENADRYL) 25 MG tablet Take 25 mg by mouth every 6 (six) hours as needed for itching (with percocet).      [name] docusate sodium (COLACE) 100 MG capsule Take 100 mg by mouth daily as needed for mild constipation.      . hydrochlorothiazide (MICROZIDE) 12.5 MG capsule Take 1 capsule (12.5 mg total) by mouth  daily.  30 capsule  11  . ipratropium (ATROVENT) 0.02 % nebulizer solution Take 2.5 mLs (0.5 mg total) by nebulization every 4 (four) hours as needed for wheezing or shortness of breath.  75 mL  11  . meloxicam (MOBIC) 15 MG tablet Take 1 tablet (15 mg total) by mouth daily.  30 tablet  0  . mometasone-formoterol (DULERA) 100-5 MCG/ACT AERO Inhale 2 puffs into the lungs 2 (two) times daily.      [name] omalizumab (XOLAIR) 150 MG injection Inject 225 mg into the skin every 14 (fourteen) days. Had inj last 06-18-12      . omeprazole (PRILOSEC) 20 MG capsule Take 20 mg by mouth daily as needed (for heartburn).       [name] oxyCODONE-acetaminophen (PERCOCET) 10-325 MG per tablet Take 1 tablet by mouth every 8 (eight) hours as needed for pain. For back pain  10 tablet  0  . predniSONE (DELTASONE) 20 MG tablet Take 2 pills a day for 3 days then 1 pill a day for 3 days then stop.  10 tablet  0  . psyllium (METAMUCIL SMOOTH TEXTURE) 28 % packet Take 1-2 packets by mouth 2 (two) times daily. Take immediately after largest meal of the day.  60 packet  3   No current facility-administered medications on  file prior to visit.

## 2013-08-02 NOTE — Telephone Encounter (Signed)
Wants to speak to nurse- getting Xolair shot now

## 2013-08-03 MED ORDER — OMALIZUMAB 150 MG ~~LOC~~ SOLR
225.0000 mg | Freq: Once | SUBCUTANEOUS | Status: AC
  Administered 2013-08-03: 225 mg via SUBCUTANEOUS

## 2013-08-09 ENCOUNTER — Ambulatory Visit: Payer: Commercial Managed Care - PPO | Admitting: Gastroenterology

## 2013-08-09 NOTE — Addendum Note (Signed)
Addended by: Hulan Fray on: 08/09/2013 05:51 PM   Modules accepted: Orders

## 2013-08-11 ENCOUNTER — Encounter (HOSPITAL_COMMUNITY): Payer: Self-pay | Admitting: Emergency Medicine

## 2013-08-11 ENCOUNTER — Emergency Department (HOSPITAL_COMMUNITY)
Admission: EM | Admit: 2013-08-11 | Discharge: 2013-08-11 | Disposition: A | Discharge status: Home / Self Care | Payer: Commercial Managed Care - PPO | Attending: Emergency Medicine | Admitting: Emergency Medicine

## 2013-08-11 ENCOUNTER — Emergency Department (HOSPITAL_COMMUNITY): Payer: Commercial Managed Care - PPO

## 2013-08-11 DIAGNOSIS — J441 Chronic obstructive pulmonary disease with (acute) exacerbation: Secondary | ICD-10-CM | POA: Insufficient documentation

## 2013-08-11 DIAGNOSIS — Z872 Personal history of diseases of the skin and subcutaneous tissue: Secondary | ICD-10-CM | POA: Insufficient documentation

## 2013-08-11 DIAGNOSIS — Z87891 Personal history of nicotine dependence: Secondary | ICD-10-CM | POA: Insufficient documentation

## 2013-08-11 DIAGNOSIS — R0789 Other chest pain: Secondary | ICD-10-CM | POA: Insufficient documentation

## 2013-08-11 DIAGNOSIS — Z79899 Other long term (current) drug therapy: Secondary | ICD-10-CM | POA: Insufficient documentation

## 2013-08-11 DIAGNOSIS — Z8742 Personal history of other diseases of the female genital tract: Secondary | ICD-10-CM | POA: Insufficient documentation

## 2013-08-11 DIAGNOSIS — K219 Gastro-esophageal reflux disease without esophagitis: Secondary | ICD-10-CM | POA: Insufficient documentation

## 2013-08-11 DIAGNOSIS — I10 Essential (primary) hypertension: Secondary | ICD-10-CM | POA: Insufficient documentation

## 2013-08-11 DIAGNOSIS — Z791 Long term (current) use of non-steroidal anti-inflammatories (NSAID): Secondary | ICD-10-CM | POA: Insufficient documentation

## 2013-08-11 DIAGNOSIS — Z8659 Personal history of other mental and behavioral disorders: Secondary | ICD-10-CM | POA: Insufficient documentation

## 2013-08-11 DIAGNOSIS — Z8619 Personal history of other infectious and parasitic diseases: Secondary | ICD-10-CM | POA: Insufficient documentation

## 2013-08-11 DIAGNOSIS — J45901 Unspecified asthma with (acute) exacerbation: Secondary | ICD-10-CM

## 2013-08-11 LAB — CBC WITH DIFFERENTIAL/PLATELET
Basophils Absolute: 0 10*3/uL (ref 0.0–0.1)
Basophils Relative: 0 % (ref 0–1)
EOS PCT: 1 % (ref 0–5)
Eosinophils Absolute: 0.1 10*3/uL (ref 0.0–0.7)
HEMATOCRIT: 37 % (ref 36.0–46.0)
Hemoglobin: 12.2 g/dL (ref 12.0–15.0)
Lymphocytes Relative: 6 % — ABNORMAL LOW (ref 12–46)
Lymphs Abs: 1.2 10*3/uL (ref 0.7–4.0)
MCH: 27.8 pg (ref 26.0–34.0)
MCHC: 33 g/dL (ref 30.0–36.0)
MCV: 84.3 fL (ref 78.0–100.0)
MONO ABS: 0.5 10*3/uL (ref 0.1–1.0)
Monocytes Relative: 2 % — ABNORMAL LOW (ref 3–12)
Neutro Abs: 19.2 10*3/uL — ABNORMAL HIGH (ref 1.7–7.7)
Neutrophils Relative %: 91 % — ABNORMAL HIGH (ref 43–77)
Platelets: 343 10*3/uL (ref 150–400)
RBC: 4.39 MIL/uL (ref 3.87–5.11)
RDW: 16.7 % — AB (ref 11.5–15.5)
WBC: 21 10*3/uL — ABNORMAL HIGH (ref 4.0–10.5)

## 2013-08-11 LAB — BASIC METABOLIC PANEL
BUN: 6 mg/dL (ref 6–23)
CALCIUM: 9 mg/dL (ref 8.4–10.5)
CO2: 22 mEq/L (ref 19–32)
CREATININE: 0.85 mg/dL (ref 0.50–1.10)
Chloride: 103 mEq/L (ref 96–112)
GFR calc Af Amer: >90 mL/min (ref >90)
GFR, EST NON AFRICAN AMERICAN: 85 mL/min — AB (ref >90)
Glucose, Bld: 125 mg/dL — ABNORMAL HIGH (ref 70–99)
Potassium: 4 mEq/L (ref 3.7–5.3)
Sodium: 139 mEq/L (ref 137–147)

## 2013-08-11 MED ORDER — PREDNISONE 50 MG PO TABS: ORAL_TABLET | ORAL | Status: DC

## 2013-08-11 MED ORDER — MAGNESIUM SULFATE 40 MG/ML IJ SOLN
2.0000 g | Freq: Once | INTRAMUSCULAR | Status: DC
  Filled 2013-08-11: qty 50

## 2013-08-11 MED ORDER — ALBUTEROL SULFATE HFA 108 (90 BASE) MCG/ACT IN AERS
2.0000 | INHALATION_SPRAY | Freq: Once | RESPIRATORY_TRACT | Status: DC
  Filled 2013-08-11: qty 6.7

## 2013-08-11 MED ORDER — ALBUTEROL (5 MG/ML) CONTINUOUS INHALATION SOLN
10.0000 mg/h | INHALATION_SOLUTION | Freq: Once | RESPIRATORY_TRACT | Status: AC
  Administered 2013-08-11: 10 mg/h via RESPIRATORY_TRACT
  Filled 2013-08-11: qty 20

## 2013-08-11 MED ORDER — IPRATROPIUM BROMIDE 0.02 % IN SOLN
0.5000 mg | Freq: Once | RESPIRATORY_TRACT | Status: AC
  Administered 2013-08-11: 0.5 mg via RESPIRATORY_TRACT
  Filled 2013-08-11: qty 2.5

## 2013-08-11 MED ORDER — ALBUTEROL SULFATE (2.5 MG/3ML) 0.083% IN NEBU
5.0000 mg | INHALATION_SOLUTION | Freq: Once | RESPIRATORY_TRACT | Status: AC
  Administered 2013-08-11: 5 mg via RESPIRATORY_TRACT
  Filled 2013-08-11: qty 6

## 2013-08-11 MED ORDER — DEXAMETHASONE SODIUM PHOSPHATE 10 MG/ML IJ SOLN
10.0000 mg | Freq: Once | INTRAMUSCULAR | Status: AC
  Administered 2013-08-11: 10 mg via INTRAMUSCULAR
  Filled 2013-08-11: qty 1

## 2013-08-11 NOTE — Discharge Instructions (Signed)
Read the information below.  Use the prescribed medication as directed.  Please discuss all new medications with your pharmacist.  You may return to the Emergency Department at any time for worsening condition or any new symptoms that concern you.  If there is any possibility that you might be pregnant, please let your health care provider know and discuss this with the pharmacist to ensure medication safety.  If you develop chest pain, worsening shortness of breath, fever, you pass out, or become weak or dizzy, return to the ER for a recheck.       Asthma, Adult Asthma is a recurring condition in which the airways tighten and narrow. Asthma can make it difficult to breathe. It can cause coughing, wheezing, and shortness of breath. Asthma episodes (also called asthma attacks) range from minor to life-threatening. Asthma cannot be cured, but medicines and lifestyle changes can help control it. CAUSES Asthma is believed to be caused by inherited (genetic) and environmental factors, but its exact cause is unknown. Asthma may be triggered by allergens, lung infections, or irritants in the air. Asthma triggers are different for each person. Common triggers include:   Animal dander.  Dust mites.  Cockroaches.  Pollen from trees or grass.  Mold.  Smoke.  Air pollutants such as dust, household cleaners, hair sprays, aerosol sprays, paint fumes, strong chemicals, or strong odors.  Cold air, weather changes, and winds (which increase molds and pollens in the air).  Strong emotional expressions such as crying or laughing hard.  Stress.  Certain medicines (such as aspirin) or types of drugs (such as beta-blockers).  Sulfites in foods and drinks. Foods and drinks that may contain sulfites include dried fruit, potato chips, and sparkling grape juice.  Infections or inflammatory conditions such as the flu, a cold, or an inflammation of the nasal membranes (rhinitis).  Gastroesophageal reflux  disease (GERD).  Exercise or strenuous activity. SYMPTOMS Symptoms may occur immediately after asthma is triggered or many hours later. Symptoms include:  Wheezing.  Excessive nighttime or early morning coughing.  Frequent or severe coughing with a common cold.  Chest tightness.  Shortness of breath. DIAGNOSIS  The diagnosis of asthma is made by a review of your medical history and a physical exam. Tests may also be performed. These may include:  Lung function studies. These tests show how much air you breath in and out.  Allergy tests.  Imaging tests such as X-rays. TREATMENT  Asthma cannot be cured, but it can usually be controlled. Treatment involves identifying and avoiding your asthma triggers. It also involves medicines. There are 2 classes of medicine used for asthma treatment:   Controller medicines. These prevent asthma symptoms from occurring. They are usually taken every day.  Reliever or rescue medicines. These quickly relieve asthma symptoms. They are used as needed and provide short-term relief. Your health care provider will help you create an asthma action plan. An asthma action plan is a written plan for managing and treating your asthma attacks. It includes a list of your asthma triggers and how they may be avoided. It also includes information on when medicines should be taken and when their dosage should be changed. An action plan may also involve the use of a device called a peak flow meter. A peak flow meter measures how well the lungs are working. It helps you monitor your condition. HOME CARE INSTRUCTIONS   Take medicine as directed by your health care provider. Speak with your health care provider if you  have questions about how or when to take the medicines.  Use a peak flow meter as directed by your health care provider. Record and keep track of readings.  Understand and use the action plan to help minimize or stop an asthma attack without needing to  seek medical care.  Control your home environment in the following ways to help prevent asthma attacks:  Do not smoke. Avoid being exposed to secondhand smoke.  Change your heating and air conditioning filter regularly.  Limit your use of fireplaces and wood stoves.  Get rid of pests (such as roaches and mice) and their droppings.  Throw away plants if you see mold on them.  Clean your floors and dust regularly. Use unscented cleaning products.  Try to have someone else vacuum for you regularly. Stay out of rooms while they are being vacuumed and for a short while afterward. If you vacuum, use a dust mask from a hardware store, a double-layered or microfilter vacuum cleaner bag, or a vacuum cleaner with a HEPA filter.  Replace carpet with wood, tile, or vinyl flooring. Carpet can trap dander and dust.  Use allergy-proof pillows, mattress covers, and box spring covers.  Wash bed sheets and blankets every week in hot water and dry them in a dryer.  Use blankets that are made of polyester or cotton.  Clean bathrooms and kitchens with bleach. If possible, have someone repaint the walls in these rooms with mold-resistant paint. Keep out of the rooms that are being cleaned and painted.  Wash hands frequently. SEEK MEDICAL CARE IF:   You have wheezing, shortness of breath, or a cough even if taking medicine to prevent attacks.  The colored mucus you cough up (sputum) is thicker than usual.  Your sputum changes from clear or white to yellow, green, gray, or bloody.  You have any problems that may be related to the medicines you are taking (such as a rash, itching, swelling, or trouble breathing).  You are using a reliever medicine more than 2 3 times per week.  Your peak flow is still at 50 79% of you personal best after following your action plan for 1 hour. SEEK IMMEDIATE MEDICAL CARE IF:   You seem to be getting worse and are unresponsive to treatment during an asthma  attack.  You are short of breath even at rest.  You get short of breath when doing very little physical activity.  You have difficulty eating, drinking, or talking due to asthma symptoms.  You develop chest pain.  You develop a fast heartbeat.  You have a bluish color to your lips or fingernails.  You are lightheaded, dizzy, or faint.  Your peak flow is less than 50% of your personal best.  You have a fever or persistent symptoms for more than 2 3 days.  You have a fever and symptoms suddenly get worse. MAKE SURE YOU:   Understand these instructions.  Will watch your condition.  Will get help right away if you are not doing well or get worse. Document Released: 04/15/2005 Document Revised: 12/16/2012 Document Reviewed: 11/12/2012 [facility] Patient Information 2014 [place], [place].

## 2013-08-11 NOTE — ED Provider Notes (Signed)
CSN: [id number]     Arrival date & time 08/11/13  0730 History   First MD Initiated Contact with Patient 08/11/13 [phone]     Chief Complaint  Patient presents with  . Asthma     (Consider location/radiation/quality/duration/timing/severity/associated sxs/prior Treatment) The history is provided by the patient.    Patient with history of asthma presents with chest tightness shortness of breath cough and wheezing that began yesterday. She's been using her albuterol nebulizer treatments without improvement. Associated sore throat. She has been exposed to more pollen recently as she was using window fans while her central air system was being repaired. No known sick contacts. Reports she has been hospitalized but never intubated for her asthma.    Past Medical History  Diagnosis Date  . Acanthosis nigricans   . Menorrhagia   . Hypertension, essential   . Insomnia   . Morbid obesity   . Helicobacter pylori (H. pylori) infection   . Sleep apnea     Sleep study 2008 - mild OSA, not enough events to titrate CPAP  . COPD (chronic obstructive pulmonary disease)     PFTs in 2002, FEV1/FVC 65, no post bronchodilater test done  . Asthma     Followed by Dr. Annamaria Boots (pulmonology); receives every other week omalizumab injections; has frequent exacerbations  . Depression   . GERD (gastroesophageal reflux disease)   . Tobacco user   . Obesity    Past Surgical History  Procedure Laterality Date  . Tubal ligation  1996    bilateral  . Breast reduction surgery  09/2011   Family History  Problem Relation Age of Onset  . Asthma Daughter   . Cancer Paternal Aunt   . Asthma Maternal Grandmother   . Hypertension Mother    History  Substance Use Topics  . Smoking status: Former Smoker -- 0.25 packs/day for 9 years    Types: Cigarettes    Quit date: 10/04/2012  . Smokeless tobacco: Never Used  . Alcohol Use: No   OB History   Grav Para Term Preterm Abortions TAB SAB Ect Mult Living                  Review of Systems  Constitutional: Positive for chills. Negative for fever.  Respiratory: Positive for cough, chest tightness, shortness of breath and wheezing.   Gastrointestinal: Negative for nausea, vomiting, abdominal pain and diarrhea.  Genitourinary: Negative for dysuria, urgency and frequency.  Allergic/Immunologic: Negative for immunocompromised state.  All other systems reviewed and are negative.     Allergies  Ace inhibitors and Percocet  Home Medications   Prior to Admission medications   Medication Sig Start Date End Date Taking? Authorizing Provider  albuterol (PROVENTIL HFA;VENTOLIN HFA) 108 (90 BASE) MCG/ACT inhaler Inhale 2 puffs into the lungs every 4 (four) hours as needed for wheezing or shortness of breath. 06/07/13   Neema Bobbie Stack, MD  albuterol (PROVENTIL) (2.5 MG/3ML) 0.083% nebulizer solution Take 3 mLs (2.5 mg total) by nebulization every 4 (four) hours as needed for wheezing or shortness of breath. 06/09/13   Lesly Dukes, MD  chlorpheniramine (CHLOR-TRIMETON) 4 MG tablet Take 1 tablet (4 mg total) by mouth 2 (two) times daily as needed for allergies. 07/21/13   Jeralene Huff, MD  hydrochlorothiazide (MICROZIDE) 12.5 MG capsule Take 1 capsule (12.5 mg total) by mouth daily. 05/20/13 05/20/14  [name], MD  ipratropium (ATROVENT) 0.02 % nebulizer solution Take 2.5 mLs (0.5 mg total) by nebulization every 4 (four)  hours as needed for wheezing or shortness of breath. 06/09/13 06/09/14  Lesly Dukes, MD  meloxicam (MOBIC) 15 MG tablet Take 1 tablet (15 mg total) by mouth daily. 07/14/13   Lesly Dukes, MD  mometasone-formoterol (DULERA) 100-5 MCG/ACT AERO Inhale 2 puffs into the lungs 2 (two) times daily.    Historical Provider, MD  omalizumab Arvid Right) 150 MG injection Inject 225 mg into the skin every 14 (fourteen) days. Had inj last 06-18-12    Historical Provider, MD  omeprazole (PRILOSEC) 20 MG capsule Take 20 mg by mouth daily as needed (for  heartburn).     Historical Provider, MD  predniSONE (DELTASONE) 20 MG tablet Take 2 pills a day for 3 days then 1 pill a day for 3 days then stop. 07/21/13   Jeralene Huff, MD  psyllium (METAMUCIL SMOOTH TEXTURE) 28 % packet Take 1-2 packets by mouth 2 (two) times daily. Take immediately after largest meal of the day. 06/28/13   Joni Reining, DO   BP 142/68  Pulse 99  Temp(Src) 100.2 F (37.9 C) (Oral)  Ht 5\' 7"  (1.702 m)  Wt 320 lb (145.151 kg)  BMI 50.11 kg/m2  SpO2 100%  LMP 07/02/2013 Physical Exam  Nursing note and vitals reviewed. Constitutional: She appears well-developed and well-nourished. No distress.  HENT:  Head: Normocephalic and atraumatic.  Neck: Neck supple.  Cardiovascular: Normal rate and regular rhythm.   Pulmonary/Chest: Effort normal. No respiratory distress. She has wheezes. She has no rales.  Loud diffuse expiratory wheezes  Abdominal: Soft. She exhibits no distension. There is no tenderness. There is no rebound and no guarding.  Neurological: She is alert.  Skin: She is not diaphoretic.    ED Course  Procedures (including critical care time) Labs Review Labs Reviewed  CBC WITH DIFFERENTIAL - Abnormal; Notable for the following:    WBC 21.0 (*)    RDW 16.7 (*)    Neutrophils Relative % 91 (*)    Neutro Abs 19.2 (*)    Lymphocytes Relative 6 (*)    Monocytes Relative 2 (*)    All other components within normal limits  BASIC METABOLIC PANEL - Abnormal; Notable for the following:    Glucose, Bld 125 (*)    GFR calc non Af Amer 85 (*)    All other components within normal limits    Imaging Review Dg Chest 2 View  08/11/2013   CLINICAL DATA:  Difficulty breathing; asthma  EXAM: CHEST  2 VIEW  COMPARISON:  July 17, 2013  FINDINGS: Lungs are clear. Heart size and pulmonary vascularity are normal. No adenopathy. No bone lesions.  IMPRESSION: No abnormality noted.   Electronically Signed   By: Lowella Grip M.D.   On: 08/11/2013 11:58      EKG Interpretation None      8:54 AM Pt reporting no improvement after neb treatment.  Continued diffuse loud wheezing on exam.   12:28 PM Pt reports some improvement.  Continued but improved wheezing on exam.  Will finish this breathing treatment and will ambulate with pulsox.  Offered patient admission.    1:30 PM Pt offered admission and she declines.  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  Pt is aware that she will likely need to come back to the hospital as she is not oxygenating well.  She desats while walking to the upper 80s, low 90s.  I have explained to her that this means her body is not getting enough oxygen and this is dangerous  for her.   MDM   Final diagnoses:  Asthma exacerbation    Pt with hx asthma/COPD p/w increased chest tightness, SOB, wheezing that began yesterday. WBC elevated, which may be due to recent steroid use and may additionally be elevated as pt may be developing URI.  Pt improving slightly with nebs, decadron, magnesium.  Patient, though improving, ambulated in hallway with O2 dropping to 86% and pt walking slowly.  I think patient needed to be admitted and I did offer her admission. She admits that she may need to be admitted but is unable to given her work situation (first 90 days of job, has already missed days for asthma).  I have offered her work notes but this will apparently not help.  I have advised her she may return at any time.  D/C home with steroids, albuterol hfa, close PCP follow up or return to ED at any time for recheck if she is not improving.  Discussed result, findings, treatment, and follow up  with patient.  Pt given return precautions.  Pt verbalizes understanding and agrees with plan.      I doubt any other EMC precluding discharge at this time including, but not necessarily limited to the following: pulmonary embolism, pneumonia    Clayton Bibles, PA-C 08/11/13 1502

## 2013-08-11 NOTE — ED Notes (Signed)
IV RN paged.

## 2013-08-11 NOTE — ED Notes (Signed)
IV mag completed

## 2013-08-11 NOTE — ED Provider Notes (Signed)
Medical screening examination/treatment/procedure(s) were performed by non-physician practitioner and as supervising physician I was immediately available for consultation/collaboration.     Veryl Speak, MD 08/11/13 [phone]

## 2013-08-11 NOTE — ED Notes (Signed)
O2 between 86-91 while ambulating

## 2013-08-11 NOTE — ED Notes (Addendum)
43 yo female presents with c/o shortness of breath related to asthma. Non-productive cough with no fever, chills but reports body aches. Pt reports that her home central air system was just repaired and possibly blew out pollen in to her home. Generalized body aches.  Took 2 puffs of albuterol with no relief.

## 2013-08-16 ENCOUNTER — Telehealth: Payer: Self-pay | Admitting: Internal Medicine

## 2013-08-16 ENCOUNTER — Ambulatory Visit: Payer: Commercial Managed Care - PPO

## 2013-08-16 MED ORDER — MOMETASONE FURO-FORMOTEROL FUM 100-5 MCG/ACT IN AERO: 2.0000 | INHALATION_SPRAY | Freq: Two times a day (BID) | RESPIRATORY_TRACT | Status: DC

## 2013-08-16 NOTE — Telephone Encounter (Signed)
Spoke with April [name] at [employer]. Xolair will be shipped to [place] on 08/18/13. Nothing further was needed.

## 2013-08-16 NOTE — Telephone Encounter (Signed)
Called and spoke with pt and she is aware of sample of the dulera that is up front and ready to be picked up.  She is aware that we do not have any of the rescue inhalers here in the office. Pt will come by and pick the sample up.

## 2013-08-16 NOTE — Telephone Encounter (Signed)
Called spoke with pt. She reports she lost her purse and not able to get her dulera/xolair until she pays her copay. She reports it had her dulera/proair in her purse, She has no way of paying for these out of pocket. She is now out of her inhalers until she can afford this. She wants to know if [facility] has samples to help her. Please advise Dr. Annamaria Boots thanks

## 2013-08-16 NOTE — Telephone Encounter (Signed)
Ok to give sample Dulera 200, 2 puffs then rinse mouth, twice daily  I don't think we have rescue inhaler sample.

## 2013-08-17 ENCOUNTER — Telehealth: Payer: Self-pay | Admitting: Internal Medicine

## 2013-08-17 NOTE — Telephone Encounter (Signed)
Spoke with patient- she requested we mail her copy of paperwork to [place], [place] address she listed on the patient assistance forms. Pt aware that the copy has been placed in the mail.

## 2013-08-17 NOTE — Telephone Encounter (Signed)
LMTCB-pt needs to know that we have sent(mailed) the [facility] patient assistance forms to [employer] I have made a copy for patient(mailed to her home address listed) and copy sent to HIM to scan in EPIC.

## 2013-08-19 ENCOUNTER — Ambulatory Visit: Payer: Commercial Managed Care - PPO

## 2013-08-19 DIAGNOSIS — J45909 Unspecified asthma, uncomplicated: Secondary | ICD-10-CM

## 2013-08-20 MED ORDER — OMALIZUMAB 150 MG ~~LOC~~ SOLR
225.0000 mg | Freq: Once | SUBCUTANEOUS | Status: AC
  Administered 2013-08-20: 225 mg via SUBCUTANEOUS

## 2013-08-29 DIAGNOSIS — J302 Other seasonal allergic rhinitis: Secondary | ICD-10-CM | POA: Insufficient documentation

## 2013-08-29 NOTE — Assessment & Plan Note (Signed)
Plan- sample Dymista 

## 2013-08-29 NOTE — Assessment & Plan Note (Signed)
At times she can't afford medications despite assistance. There is a component of  obesity hypoventilation as well. Plan-sample Ruthe Mannan

## 2013-08-29 NOTE — Assessment & Plan Note (Signed)
Says she remains off cigarettes

## 2013-09-02 ENCOUNTER — Ambulatory Visit: Payer: Commercial Managed Care - PPO

## 2013-09-07 ENCOUNTER — Telehealth: Payer: Self-pay | Admitting: Internal Medicine

## 2013-09-07 ENCOUNTER — Ambulatory Visit: Payer: Commercial Managed Care - PPO

## 2013-09-07 DIAGNOSIS — J45909 Unspecified asthma, uncomplicated: Secondary | ICD-10-CM

## 2013-09-07 NOTE — Telephone Encounter (Signed)
Spoke with pt. Aware no samples available at this time. Nothing further needed

## 2013-09-08 MED ORDER — OMALIZUMAB 150 MG ~~LOC~~ SOLR
225.0000 mg | Freq: Once | SUBCUTANEOUS | Status: AC
  Administered 2013-09-08: 225 mg via SUBCUTANEOUS

## 2013-09-15 ENCOUNTER — Other Ambulatory Visit (HOSPITAL_COMMUNITY): Payer: Self-pay | Admitting: Internal Medicine

## 2013-09-15 ENCOUNTER — Telehealth: Payer: Self-pay | Admitting: Internal Medicine

## 2013-09-15 MED ORDER — ALBUTEROL SULFATE HFA 108 (90 BASE) MCG/ACT IN AERS: 2.0000 | INHALATION_SPRAY | RESPIRATORY_TRACT | Status: DC | PRN

## 2013-09-15 NOTE — Telephone Encounter (Signed)
Pt aware that I sent 1 time Rx for her; she was also made aware of her next Xolair injection appt and CY appt. Nothing further needed at this time.

## 2013-09-17 ENCOUNTER — Ambulatory Visit: Payer: Commercial Managed Care - PPO | Admitting: Internal Medicine

## 2013-09-17 ENCOUNTER — Encounter: Payer: Self-pay | Admitting: Internal Medicine

## 2013-09-17 ENCOUNTER — Ambulatory Visit (INDEPENDENT_AMBULATORY_CARE_PROVIDER_SITE_OTHER): Payer: Commercial Managed Care - PPO | Admitting: Internal Medicine

## 2013-09-17 VITALS — BP 145/96 | HR 93 | Temp 98.1°F | Ht 67.0 in | Wt 356.6 lb

## 2013-09-17 DIAGNOSIS — G4700 Insomnia, unspecified: Secondary | ICD-10-CM

## 2013-09-17 DIAGNOSIS — M25461 Effusion, right knee: Secondary | ICD-10-CM

## 2013-09-17 DIAGNOSIS — M25512 Pain in left shoulder: Secondary | ICD-10-CM | POA: Insufficient documentation

## 2013-09-17 DIAGNOSIS — M25469 Effusion, unspecified knee: Secondary | ICD-10-CM

## 2013-09-17 DIAGNOSIS — L740 Miliaria rubra: Secondary | ICD-10-CM | POA: Insufficient documentation

## 2013-09-17 DIAGNOSIS — M25519 Pain in unspecified shoulder: Secondary | ICD-10-CM

## 2013-09-17 MED ORDER — LIDOCAINE 5 % EX PTCH: 1.0000 | MEDICATED_PATCH | Freq: Two times a day (BID) | CUTANEOUS | Status: DC

## 2013-09-17 MED ORDER — MELOXICAM 15 MG PO TABS
15.0000 mg | ORAL_TABLET | Freq: Every day | ORAL | Status: DC
Start: 2013-09-17 — End: 2013-11-23

## 2013-09-17 MED ORDER — MELATONIN 1 MG PO CAPS: 1.0000 mg | ORAL_CAPSULE | Freq: Every evening | ORAL | Status: DC | PRN

## 2013-09-17 NOTE — Progress Notes (Signed)
Subjective:    Patient ID: April [name], female    DOB: [birth year], 43 y.o.   MRN: [id number]  HPI  April [name] is a 43 y.o. female with a history of chronic severe asthma, obesity, HTN, presenting for an acute visit.  Patient reports she rolled out of bed about one month ago. It was a 1-1.5 foot drop. She landed on her left shoulder and has had an aching pain since then. Her range of motion is not restricted. She is also having swelling in her right knee. This also dates back to the fall, but she cannot remember falling on her right knee. She's not having significant pain in her right knee. She requests Percocet.  She is also complaining of insomnia. She is able to fall asleep, but has trouble going back to sleep if she wakes up at any point in the night. She works the 11 PM to 7 AM shift so "night" is really daytime.  She saw her pulmonologist Dr. Annamaria Boots on 08/02/2013. No shortness of breath, cough, wheezing.   Current Outpatient Prescriptions on File Prior to Visit  Medication Sig Dispense Refill  . albuterol (PROAIR HFA) 108 (90 BASE) MCG/ACT inhaler Inhale 2 puffs into the lungs every 4 (four) hours as needed for wheezing or shortness of breath.  8.5 g  2  . albuterol (PROVENTIL) (2.5 MG/3ML) 0.083% nebulizer solution Take 3 mLs (2.5 mg total) by nebulization every 4 (four) hours as needed for wheezing or shortness of breath.  30 vial  11  . chlorpheniramine (CHLOR-TRIMETON) 4 MG tablet Take 1 tablet (4 mg total) by mouth 2 (two) times daily as needed for allergies.  14 tablet  0  . hydrochlorothiazide (MICROZIDE) 12.5 MG capsule Take 1 capsule (12.5 mg total) by mouth daily.  30 capsule  11  . ipratropium (ATROVENT) 0.02 % nebulizer solution Take 2.5 mLs (0.5 mg total) by nebulization every 4 (four) hours as needed for wheezing or shortness of breath.  75 mL  11  . meloxicam (MOBIC) 15 MG tablet Take 1 tablet (15 mg total) by mouth daily.  30 tablet  0  . mometasone-formoterol (DULERA)  100-5 MCG/ACT AERO Inhale 2 puffs into the lungs 2 (two) times daily.  1 Inhaler  0  . omalizumab (XOLAIR) 150 MG injection Inject 225 mg into the skin every 14 (fourteen) days. Had inj last 06-18-12      . omeprazole (PRILOSEC) 20 MG capsule Take 20 mg by mouth daily as needed (for heartburn).       . predniSONE (DELTASONE) 50 MG tablet Take 1 tab PO daily with breakfast  7 tablet  0  . psyllium (METAMUCIL SMOOTH TEXTURE) 28 % packet Take 1-2 packets by mouth 2 (two) times daily. Take immediately after largest meal of the day.  60 packet  3     Review of Systems  Constitutional: Negative for fever and appetite change.  Respiratory: Negative for cough and shortness of breath.   Cardiovascular: Negative for chest pain.  Musculoskeletal: Positive for arthralgias. Negative for back pain, gait problem and neck pain.  Neurological: Negative for dizziness, weakness and numbness.       Objective:   Physical Exam  Constitutional: She is oriented to person, place, and time. She appears well-developed and well-nourished.  Still obese  HENT:  Head: Normocephalic and atraumatic.  Eyes: Conjunctivae and EOM are normal. Pupils are equal, round, and reactive to light.  Neck: Normal range of motion. Neck supple.  Cardiovascular:  Normal rate and regular rhythm.  Exam reveals no gallop and no friction rub.   No murmur heard. Pulmonary/Chest: Effort normal and breath sounds normal. No respiratory distress. She has no wheezes.  Abdominal: Soft. She exhibits no distension.  Musculoskeletal:       Right shoulder: She exhibits tenderness (Diffuse tenderness around glenohumeral joint). She exhibits normal range of motion, no bony tenderness, no swelling, no effusion, no crepitus and no deformity.       Left knee: She exhibits swelling (Lateral/anterior swelling, no palpable effusion). She exhibits normal range of motion, no effusion, no deformity and no bony tenderness. No tenderness found. No medial joint  line, no lateral joint line, no MCL, no LCL and no patellar tendon tenderness noted.  Neurological: She is alert and oriented to person, place, and time. No cranial nerve deficit.  Skin: Skin is warm and dry. Rash (miliaria-like rash on middle back) noted.  Psychiatric: She has a normal mood and affect.     Filed Vitals:   09/17/13 1553  BP: 145/96  Pulse: 93  Temp: 98.1 F (36.7 C)        Assessment & Plan:   Please see problem based charting.

## 2013-09-17 NOTE — Assessment & Plan Note (Signed)
Patient has heat rash on her back. It is somewhat itchy. Advised her to wear loosefitting clothing, apply cool compresses, take frequent cool showers.

## 2013-09-17 NOTE — Assessment & Plan Note (Addendum)
Patient is having pain in her left shoulder after rolling out of bed one month ago. Range of motion is preserved. Exam was only notable for some diffuse mild tenderness around the glenohumeral joint. There is no deformity, no neurologic symptoms. Patient requests Percocet, as she often does in her visits with me. She tells me she has tried NSAIDs, Tylenol, Voltaren gel, and the only thing that helped was when she took a friend's Percocet. I do not think a narcotic pain medication is indicated at this time. I have seen her many times for pain complaints and she is often not complaint with my recommendations for NSAIDs, PT, sports medicine referral, etc., making the situation harder. She's actually been banned from the sports medicine clinic here due to chronic no-shows. - Gave a prescription for Mobic and lidocaine patches to be placed on the shoulder - Continue to monitor, patient will call the clinic if the pain continues after another 2-3 weeks for reevaluation

## 2013-09-17 NOTE — Patient Instructions (Signed)
Thank you for your visit. - I have prescribed melatonin supplements to help with your insomnia. Please take one capsule before bed. - I have also attached some tips on sleep hygiene. If you follow these tips, studies have shown they are the best things for insomnia. - For your shoulder pain and knee pain, I recommend you take an anti-inflammatory drug called Mobic. I know you have tried this before for your back without much success, but this is really the best kind of medicine for muscle pain and joint swelling. - If you pain has not gotten better in a few weeks, please call the clinic and we will re-evaluate and potentially do X-rays.

## 2013-09-17 NOTE — Assessment & Plan Note (Signed)
Patient has swelling of her lateral anterior knee. She does not have significant pain. There is no palpable effusion, no deformity, patella is in place. No calf swelling or tenderness, popliteal space is within normal limits. I do not suspect a blood clot or a significant injury. She has a history of a Baker's cyst in her left knee on her problem list. It is possible this issue with her right knee is also related to a Baker's cyst.  - Continue to monitor, consider working up if swelling has not resolved by next appointment

## 2013-09-17 NOTE — Assessment & Plan Note (Signed)
Patient likely has a shift work sleeping disorder. She has tried Tylenol PM, Benadryl, Unisom without much help. - Counseled the patient on sleep hygiene and provided printed materials - Provided a prescription for melatonin 1 mg each bedtime

## 2013-09-21 ENCOUNTER — Ambulatory Visit: Payer: Commercial Managed Care - PPO

## 2013-09-25 NOTE — Progress Notes (Signed)
Case discussed with Dr. Cater at time of visit.  We reviewed the resident's history and exam and pertinent patient test results.  I agree with the assessment, diagnosis, and plan of care documented in the resident's note. 

## 2013-09-27 ENCOUNTER — Ambulatory Visit: Payer: Commercial Managed Care - PPO | Admitting: Internal Medicine

## 2013-10-08 ENCOUNTER — Encounter (HOSPITAL_COMMUNITY): Payer: Self-pay | Admitting: Emergency Medicine

## 2013-10-08 ENCOUNTER — Other Ambulatory Visit: Payer: Self-pay | Admitting: Internal Medicine

## 2013-10-08 ENCOUNTER — Emergency Department (INDEPENDENT_AMBULATORY_CARE_PROVIDER_SITE_OTHER): Payer: Commercial Managed Care - PPO

## 2013-10-08 ENCOUNTER — Emergency Department (INDEPENDENT_AMBULATORY_CARE_PROVIDER_SITE_OTHER)
Admission: EM | Admit: 2013-10-08 | Discharge: 2013-10-08 | Disposition: A | Discharge status: Home / Self Care | Payer: Commercial Managed Care - PPO | Source: Home / Self Care | Attending: Emergency Medicine | Admitting: Emergency Medicine

## 2013-10-08 DIAGNOSIS — Y9269 Other specified industrial and construction area as the place of occurrence of the external cause: Secondary | ICD-10-CM

## 2013-10-08 DIAGNOSIS — X500XXA Overexertion from strenuous movement or load, initial encounter: Secondary | ICD-10-CM

## 2013-10-08 DIAGNOSIS — S93609A Unspecified sprain of unspecified foot, initial encounter: Secondary | ICD-10-CM

## 2013-10-08 DIAGNOSIS — S93699A Other sprain of unspecified foot, initial encounter: Secondary | ICD-10-CM

## 2013-10-08 DIAGNOSIS — Y939 Activity, unspecified: Secondary | ICD-10-CM

## 2013-10-08 MED ORDER — OXYCODONE-ACETAMINOPHEN 10-325 MG PO TABS: 1.0000 | ORAL_TABLET | ORAL | Status: DC | PRN

## 2013-10-08 MED ORDER — HYDROCODONE-ACETAMINOPHEN 5-325 MG PO TABS
2.0000 | ORAL_TABLET | Freq: Once | ORAL | Status: AC
  Administered 2013-10-08: 2 via ORAL

## 2013-10-08 MED ORDER — ALBUTEROL SULFATE HFA 108 (90 BASE) MCG/ACT IN AERS
INHALATION_SPRAY | RESPIRATORY_TRACT | Status: AC
  Filled 2013-10-08: qty 6.7

## 2013-10-08 MED ORDER — DIPHENHYDRAMINE HCL 25 MG PO CAPS
25.0000 mg | ORAL_CAPSULE | Freq: Once | ORAL | Status: AC
  Administered 2013-10-08: 25 mg via ORAL

## 2013-10-08 MED ORDER — HYDROCODONE-ACETAMINOPHEN 5-325 MG PO TABS
ORAL_TABLET | ORAL | Status: AC
  Filled 2013-10-08: qty 2

## 2013-10-08 MED ORDER — ALBUTEROL SULFATE HFA 108 (90 BASE) MCG/ACT IN AERS: 2.0000 | INHALATION_SPRAY | RESPIRATORY_TRACT | Status: DC

## 2013-10-08 MED ORDER — DIPHENHYDRAMINE HCL 25 MG PO CAPS
ORAL_CAPSULE | ORAL | Status: AC
  Filled 2013-10-08: qty 1

## 2013-10-08 NOTE — Discharge Instructions (Signed)
Plantar fasciitis is a tendonitis (inflammed tendon) of the the plantar fascia, the tendon on the bottom of the foot that supports the arch.  Often the pain is localized to the heel and can be worse first thing in the morning after getting up or after sitting for a long period of time and tends to improve as the day goes by, only to worsen later on in the afternoon or evening after you have been on your feet for a long time.  Following the program outlined below cures most cases.  If conservative measures like these don't work, corticosteroid injection, or referral to a podiatrist are other options.   Wear well fitting, lace up shoes with good arch support.  Tennis or running shoes are the best.  Do not wear heels, flip-flops, scuffs, or any kind of shoe without adequate support.    Use a heel lift.  These can be purchased at a shoe store or pharmacy.  They come in various price ranges.  Some people find that an orthotic insert with arch support works better.  Use of over the counter pain meds can be of help.  Tylenol (or acetaminophen) is the safest to use.  It often helps to take this regularly.  You can take up to 2 325 mg tablets 5 times daily, but it best to start out much lower that that, perhaps 2 325 mg tablets twice daily, then increase from there. People who are on the blood thinner warfarin have to be careful about taking high doses of Tylenol.  For people who are able to tolerate them, ibuprofen and naproxyn can also help with the pain.  You should discuss these agents with your physician before taking them.  People with chronic kidney disease, hypertension, peptic ulcer disease, and reflux can suffer adverse side effects. They should not be taken with warfarin. The maximum dosage of ibuprofen is 800 mg 3 times daily with meals.  The maximum dosage of naprosyn is 2 and 1/2 tablets twice daily with food, but again, start out low and gradually increase the dose until adequate pain relief is  achieved. Ibuprofen and naprosyn should always be taken with food.  Ice massage is helpful.  The best way to apply this is to freeze a bottle of water, place in on a towel and roll your foot over the bottle to produce an ice massage effect. This can be done as often as every 2 to 3 hours, depending on symptoms.  At the very least, try to do after you have been on your feet for a long period of time and after doing the exercises outlined below.  Do the exercises outlined below twice daily:

## 2013-10-08 NOTE — ED Provider Notes (Signed)
Chief Complaint   Chief Complaint  Patient presents with  . Foot Pain    History of Present Illness   April [name] is a 43 year old female who was at work yesterday, when she rolled her left foot, and ever since then has had pain on the plantar surface of the foot around the instep area. It hurts to move the foot, to dorsiflex, and is worse if she lies flat and somewhat better she walks. She denies any swelling or bruising. There is no numbness or tingling. She denies any ankle pain.  Review of Systems   Other than as noted above, the patient denies any of the following symptoms: Systemic:  No fevers or chills. Musculoskeletal:  No joint pain or arthritis.  Neurological:  No muscular weakness, paresthesias.   [name]   Past medical history, family history, social history, meds, and allergies were reviewed.   She has hypertension asthma. She takes hydrochlorothiazide, Dulera, Xolair, Prilosec, pro-air, Chlor-Trimeton, Atrovent, and Lidoderm.  Physical  Examination     Vital signs:  BP 149/92  Pulse 88  Temp(Src) 97.8 F (36.6 C) (Oral)  Resp 18  SpO2 100%  LMP 10/06/2013 Gen:  Alert and oriented times 3.  In no distress. Musculoskeletal:  Exam of the foot reveals there is no bruising, swelling, or deformity. There is pain to palpation over the plantar fascia. There's no pain to palpation of the ankle. The ankle joint has a full range of motion. There is pain on dorsiflexion and anything that stretches the plantar fascia. Pulses are full.  Otherwise, all joints had a full a ROM with no swelling, bruising or deformity.  No edema, pulses full. Extremities were warm and pink.  Capillary refill was brisk.  Skin:  Clear, warm and dry.  No rash. Neuro:  Alert and oriented times 3.  Muscle strength was normal.  Sensation was intact to light touch.    Radiology   Dg Foot Complete Left  10/08/2013   CLINICAL DATA:  Twisted foot.  Pain.  EXAM: LEFT FOOT - COMPLETE 3+ VIEW  COMPARISON:   Ankle series 617 2012  FINDINGS: No acute bony abnormality. Specifically, no fracture, subluxation, or dislocation. Soft tissues are intact. Mild arthritic changes in the midfoot. Plantar calcaneal spur.  IMPRESSION: No acute bony abnormality.   Electronically Signed   By: Rolm Baptise M.D.   On: 10/08/2013 17:38   I reviewed the images independently and personally and concur with the radiologist's findings.  Course in [facility]gent [place]   She was placed in a [place].  Assessment   The primary encounter diagnosis was Traumatic rupture of plantar fascia. A diagnosis of Place of occurrence, industrial places and premises was also pertinent to this visit.  Since this did happen at work she is thinking about filing it under workers comp, but has not done so yet. I suggested that she did file for workers comp that she followup with the occupational health clinic.  Plan    1.  Meds:  The following meds were prescribed:   Discharge Medication List as of 10/08/2013  6:12 PM    START taking these medications   Details  oxyCODONE-acetaminophen (PERCOCET) 10-325 MG per tablet Take 1 tablet by mouth every 4 (four) hours as needed for pain., Starting 10/08/2013, Until Discontinued, Print        2.  Patient Education/Counseling:  The patient was given appropriate handouts, self care instructions, and instructed in symptomatic relief including rest and activity, elevation,  application of ice and compression.  She was given some exercises to start doing.  3.  Follow up:  The patient was told to follow up here if no better in 3 to 4 days, or sooner if becoming worse in any way, and given some red flag symptoms such as worsening pain or neurological symptoms which would prompt immediate return.  Follow up at occupational health clinic.       Harden Mo, MD 10/08/13 2126

## 2013-10-08 NOTE — ED Notes (Signed)
Patient complains of left foot pain after twisting ankle; states pain while ambulating, but pain is worse when sitting.

## 2013-10-11 ENCOUNTER — Encounter: Payer: Self-pay | Admitting: Internal Medicine

## 2013-10-11 ENCOUNTER — Ambulatory Visit: Payer: Commercial Managed Care - PPO | Admitting: Internal Medicine

## 2013-10-12 ENCOUNTER — Ambulatory Visit: Payer: Commercial Managed Care - PPO

## 2013-10-12 ENCOUNTER — Ambulatory Visit: Payer: Commercial Managed Care - PPO | Admitting: Internal Medicine

## 2013-10-15 ENCOUNTER — Ambulatory Visit: Payer: Commercial Managed Care - PPO

## 2013-10-15 DIAGNOSIS — J45909 Unspecified asthma, uncomplicated: Secondary | ICD-10-CM

## 2013-10-15 MED ORDER — OMALIZUMAB 150 MG ~~LOC~~ SOLR
225.0000 mg | Freq: Once | SUBCUTANEOUS | Status: AC
  Administered 2013-10-15: 225 mg via SUBCUTANEOUS

## 2013-10-23 IMAGING — CR DG CHEST 2V
2 series · 2 of 2 positions shown · non-contrast
Comparison: 10/06/2012.

CLINICAL DATA: Chest pain and shortness of breath.

CHEST - 2 VIEW

[w chest pa]
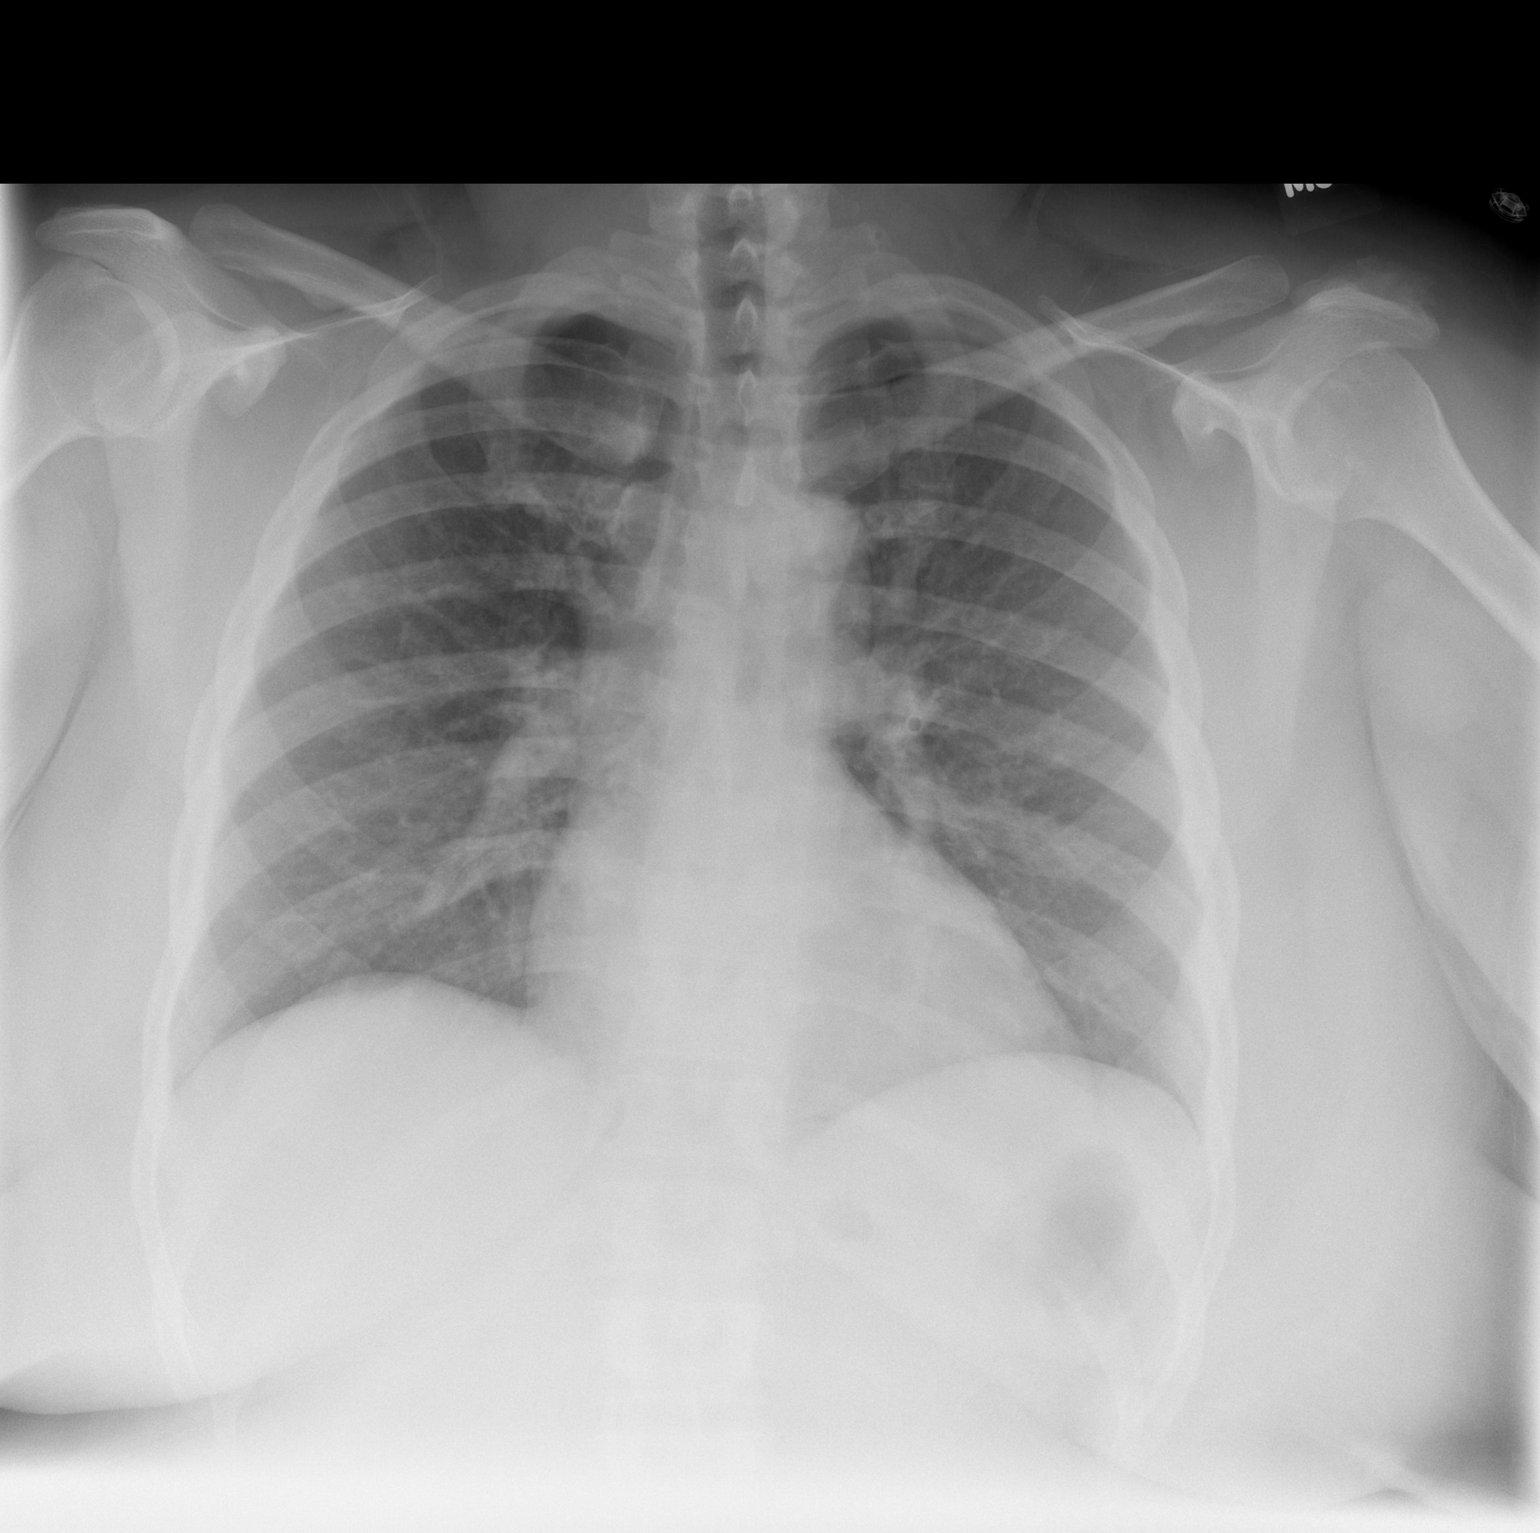

[w chest lat]
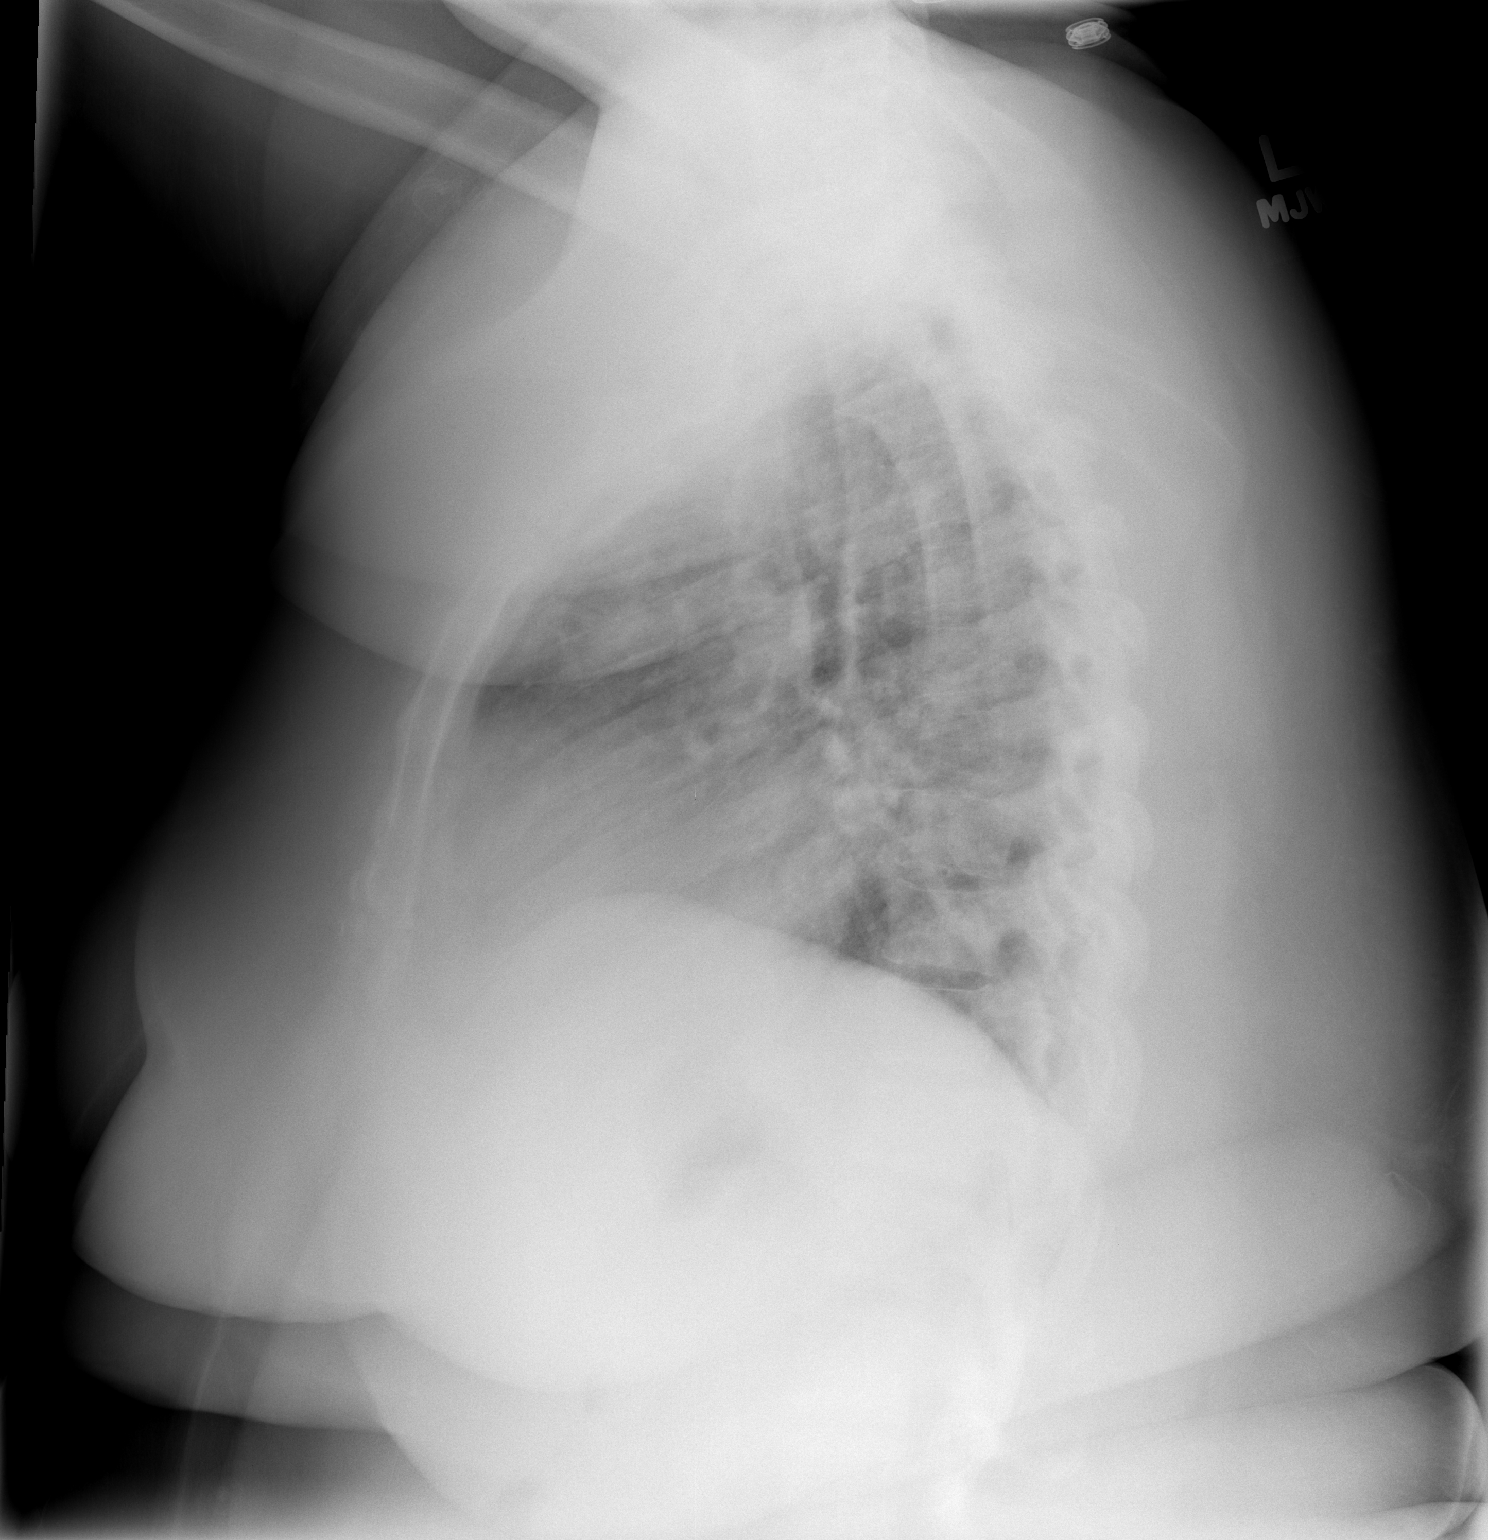

[2 of 2 positions shown; findings below may reference images not displayed]

FINDINGS: Trachea is midline.  Heart size normal.  Lungs are clear.
No pleural fluid.
IMPRESSION: No acute findings.

## 2013-11-01 ENCOUNTER — Ambulatory Visit: Payer: Commercial Managed Care - PPO

## 2013-11-03 ENCOUNTER — Telehealth: Payer: Self-pay | Admitting: Internal Medicine

## 2013-11-03 NOTE — Telephone Encounter (Signed)
Called made pt aware no samples available. No rx was needed. Nothing further needed

## 2013-11-04 ENCOUNTER — Ambulatory Visit: Payer: Commercial Managed Care - PPO

## 2013-11-04 ENCOUNTER — Other Ambulatory Visit (HOSPITAL_COMMUNITY): Payer: Self-pay | Admitting: Internal Medicine

## 2013-11-04 DIAGNOSIS — I10 Essential (primary) hypertension: Secondary | ICD-10-CM

## 2013-11-04 DIAGNOSIS — J45909 Unspecified asthma, uncomplicated: Secondary | ICD-10-CM

## 2013-11-04 MED ORDER — HYDROCHLOROTHIAZIDE 12.5 MG PO CAPS: 12.5000 mg | ORAL_CAPSULE | Freq: Every day | ORAL | Status: DC

## 2013-11-04 NOTE — Telephone Encounter (Signed)
Talked to pt - she's not at home and does not know the mg of HCTZ but stated she will call back today or tomorrow

## 2013-11-04 NOTE — Telephone Encounter (Signed)
Pt call, no answer - message left; [facility] pharmacy states only rx was for 25mg .

## 2013-11-04 NOTE — Telephone Encounter (Signed)
She has supposed to have been on 12.5mg  for the last 6 months.  Can you call and confirm what dose she has been taking.  This looks like an old prescription.

## 2013-11-05 MED ORDER — OMALIZUMAB 150 MG ~~LOC~~ SOLR
225.0000 mg | Freq: Once | SUBCUTANEOUS | Status: AC
  Administered 2013-11-05: 225 mg via SUBCUTANEOUS

## 2013-11-05 NOTE — Telephone Encounter (Signed)
Talked to pt - states her current bottle, filled June 12th, has 25mg .

## 2013-11-06 IMAGING — CR DG CHEST 2V
2 series · 2 of 2 positions shown · non-contrast
Comparison: 10/07/2012

CLINICAL DATA: , cough, shortness of breath.

CHEST - 2 VIEW

[w chest pa]
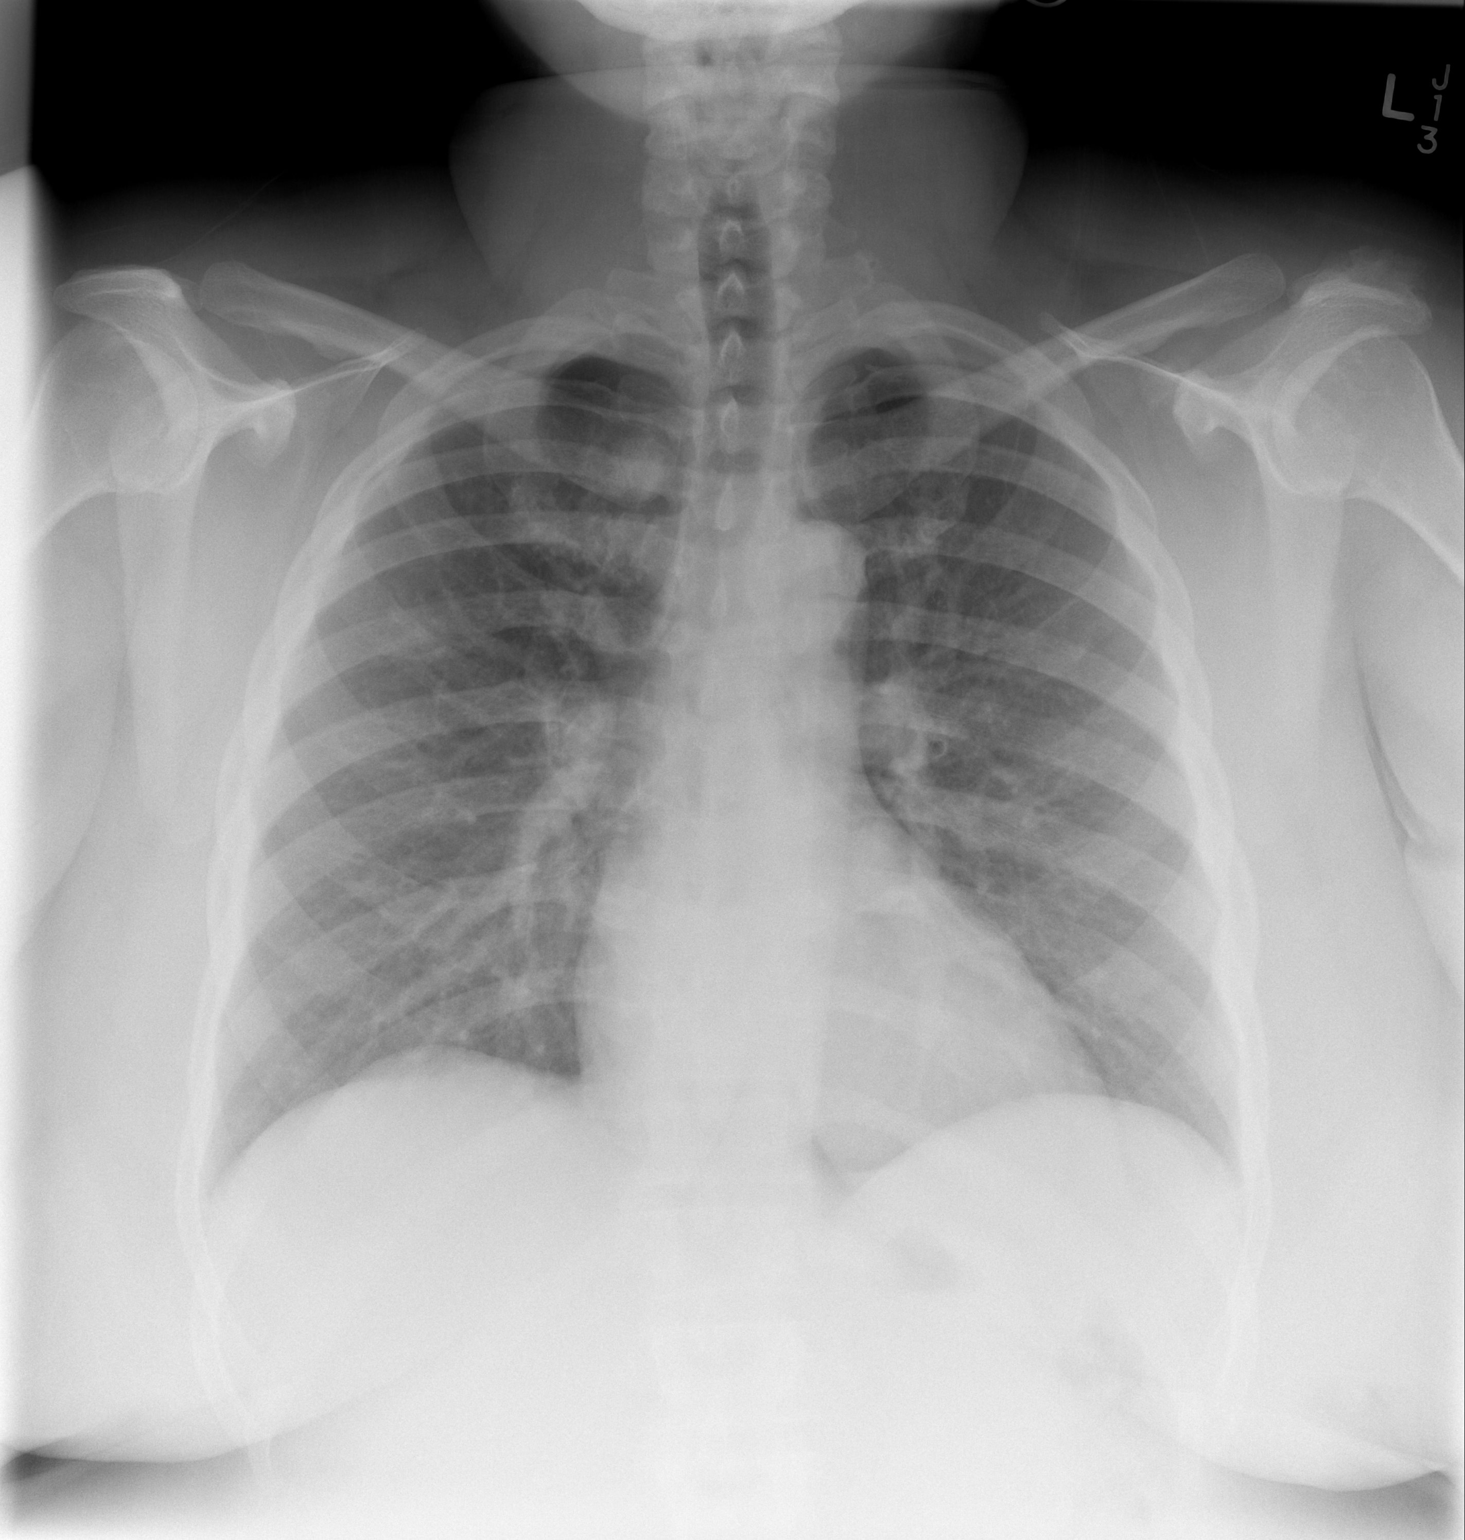

[w chest lat]
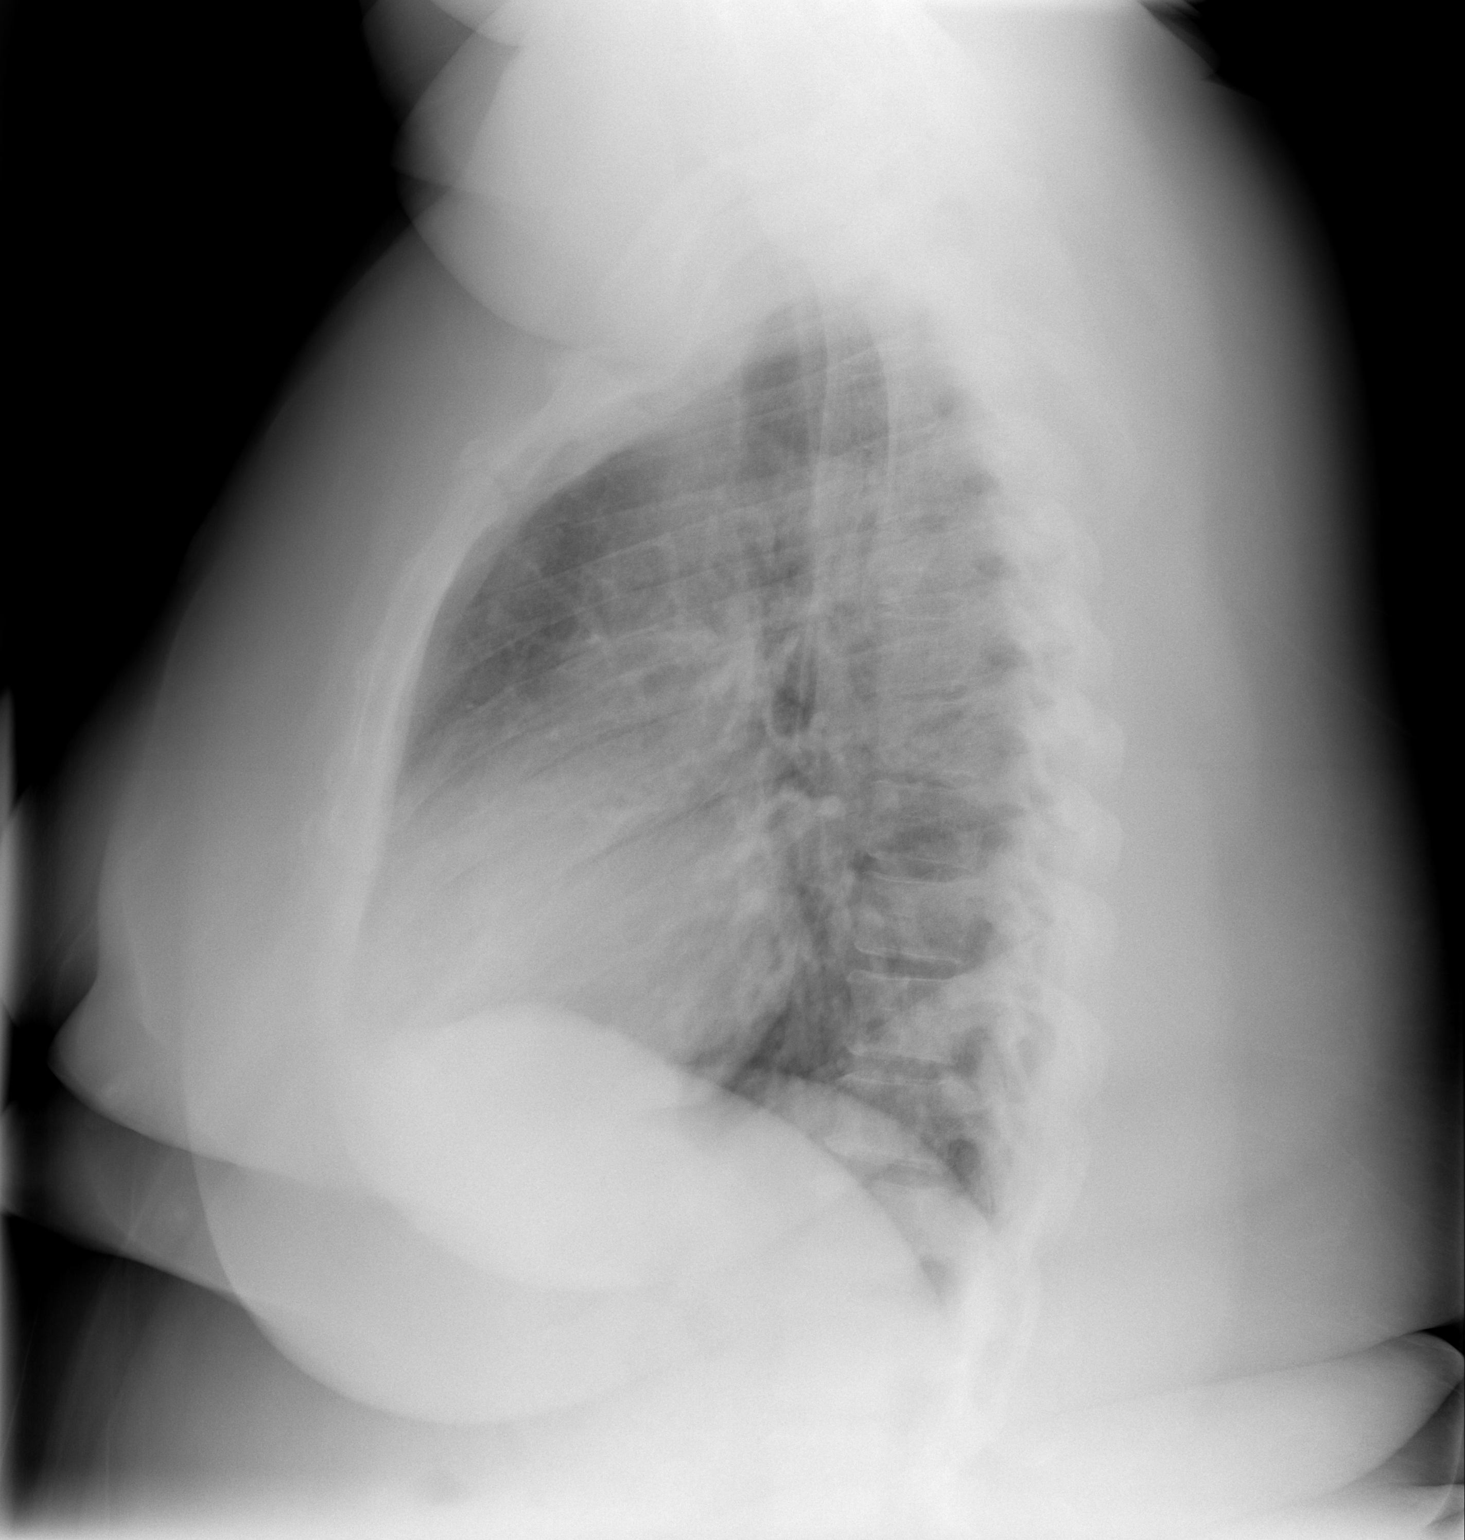

[2 of 2 positions shown; findings below may reference images not displayed]

FINDINGS: Mild peribronchial thickening.  Heart is normal size.  No
confluent opacity or effusion.  No acute bony abnormality.  No
change since prior study.
IMPRESSION: Stable mild bronchitic changes.

## 2013-11-13 IMAGING — CR DG CHEST 2V
2 series · 2 of 2 positions shown · non-contrast
Comparison: 10/21/2012

CLINICAL DATA: Exacerbation of asthma

CHEST - 2 VIEW

[w chest pa]
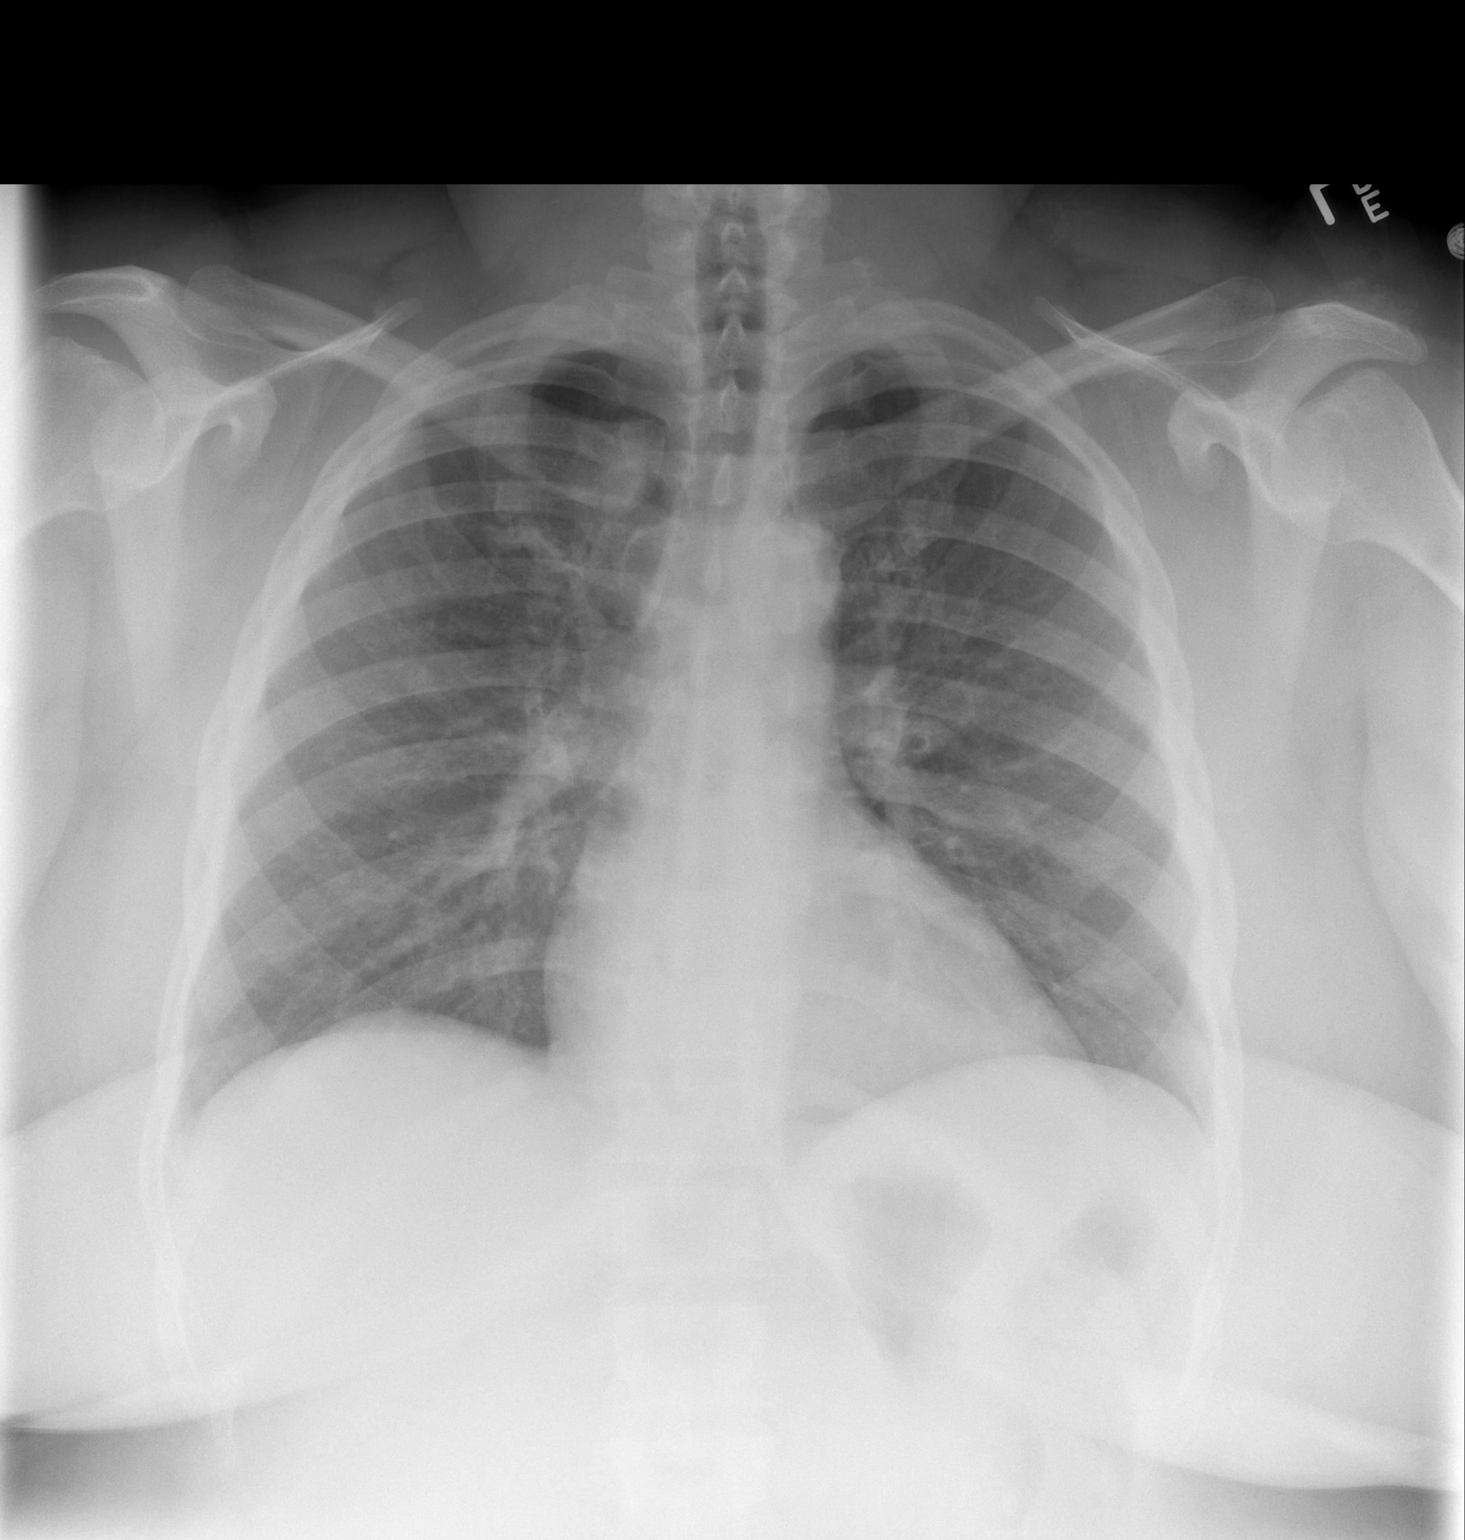

[w chest lat]
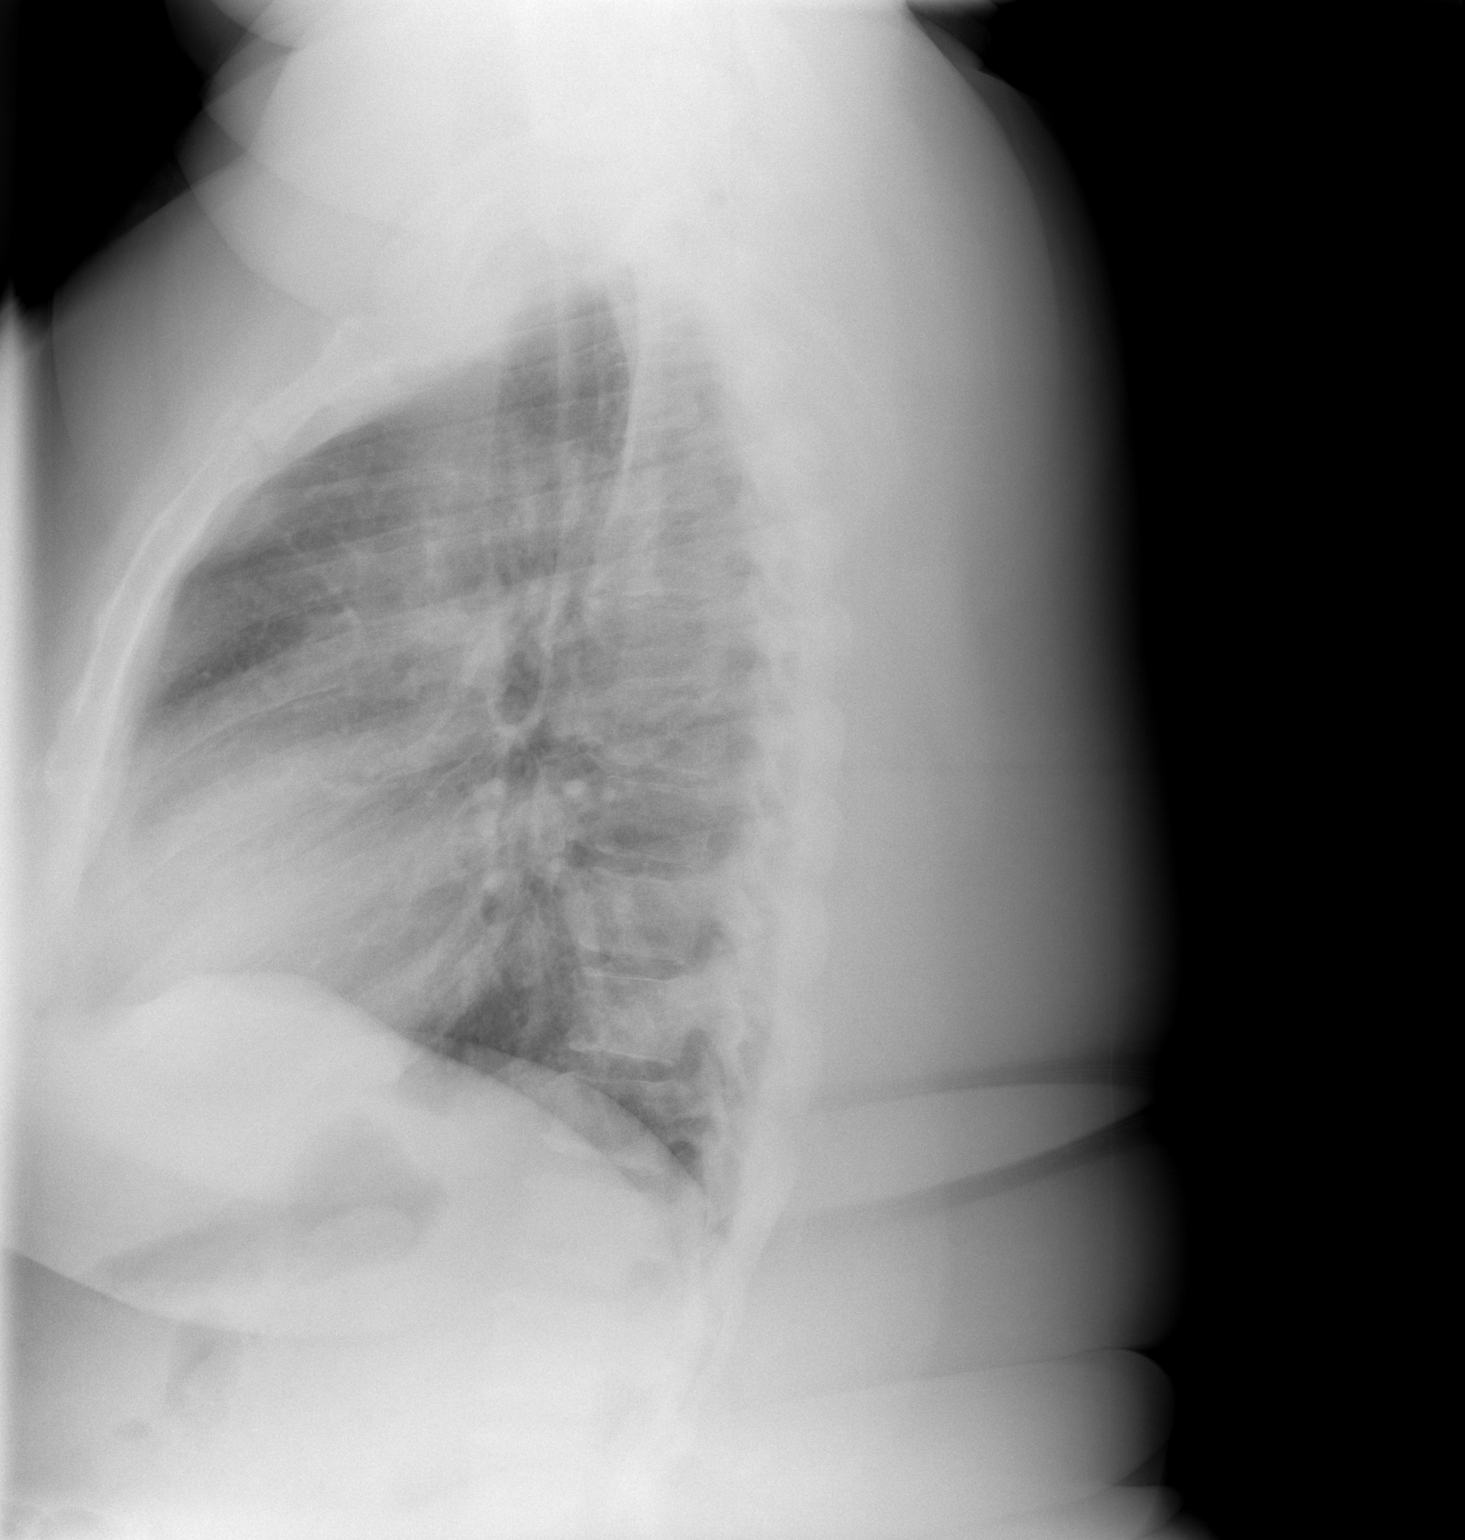

[2 of 2 positions shown; findings below may reference images not displayed]

FINDINGS: The heart size and mediastinal contours are within normal
limits.  Both lungs are clear.  The visualized skeletal structures
are unremarkable.
IMPRESSION: No acute cardiopulmonary abnormalities.

## 2013-11-18 ENCOUNTER — Ambulatory Visit: Payer: Commercial Managed Care - PPO

## 2013-11-19 IMAGING — CR DG CHEST 2V
2 series · 2 of 2 positions shown · non-contrast
Comparison: Two-view chest 10/28/2012.

CLINICAL DATA: Asthma exacerbation.

CHEST - 2 VIEW

[w chest pa]
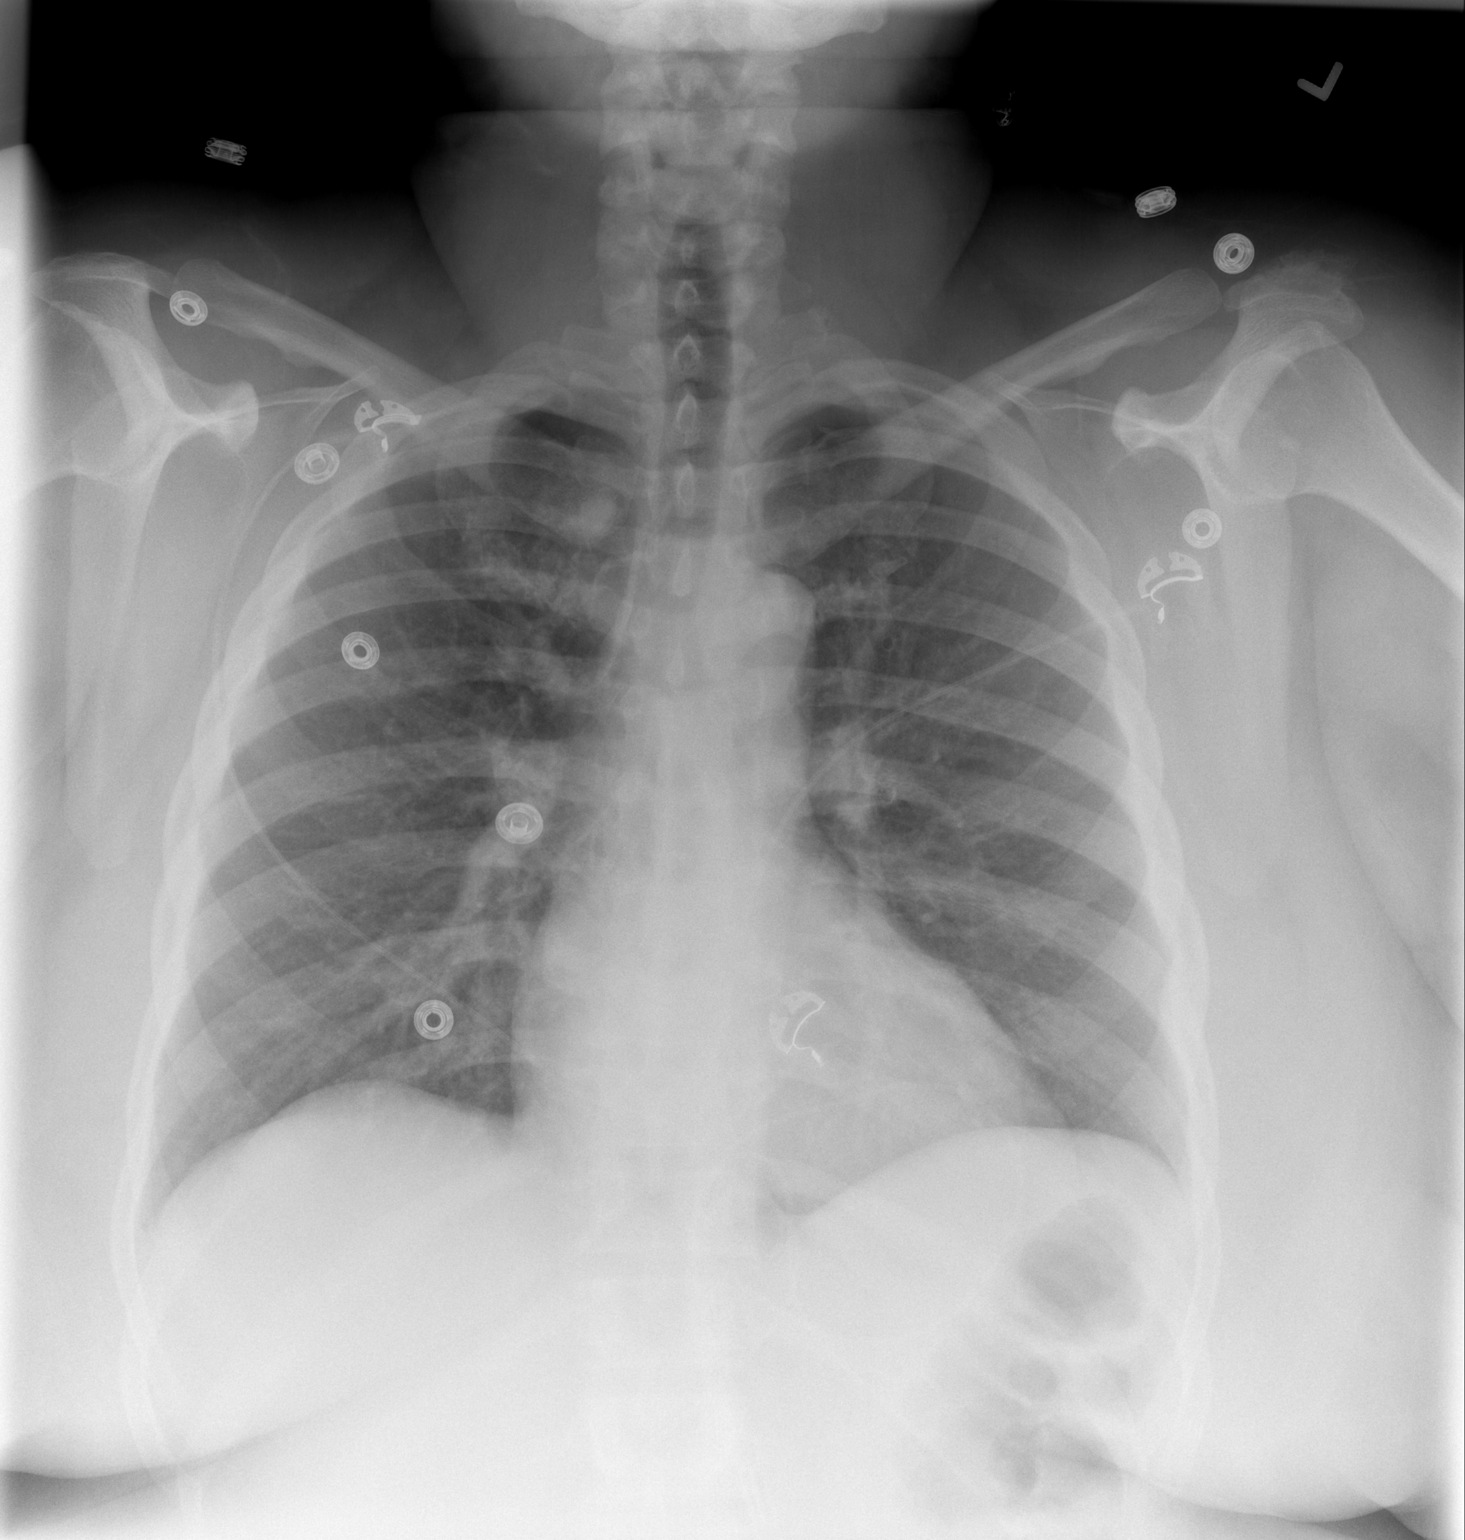

[w chest lat]
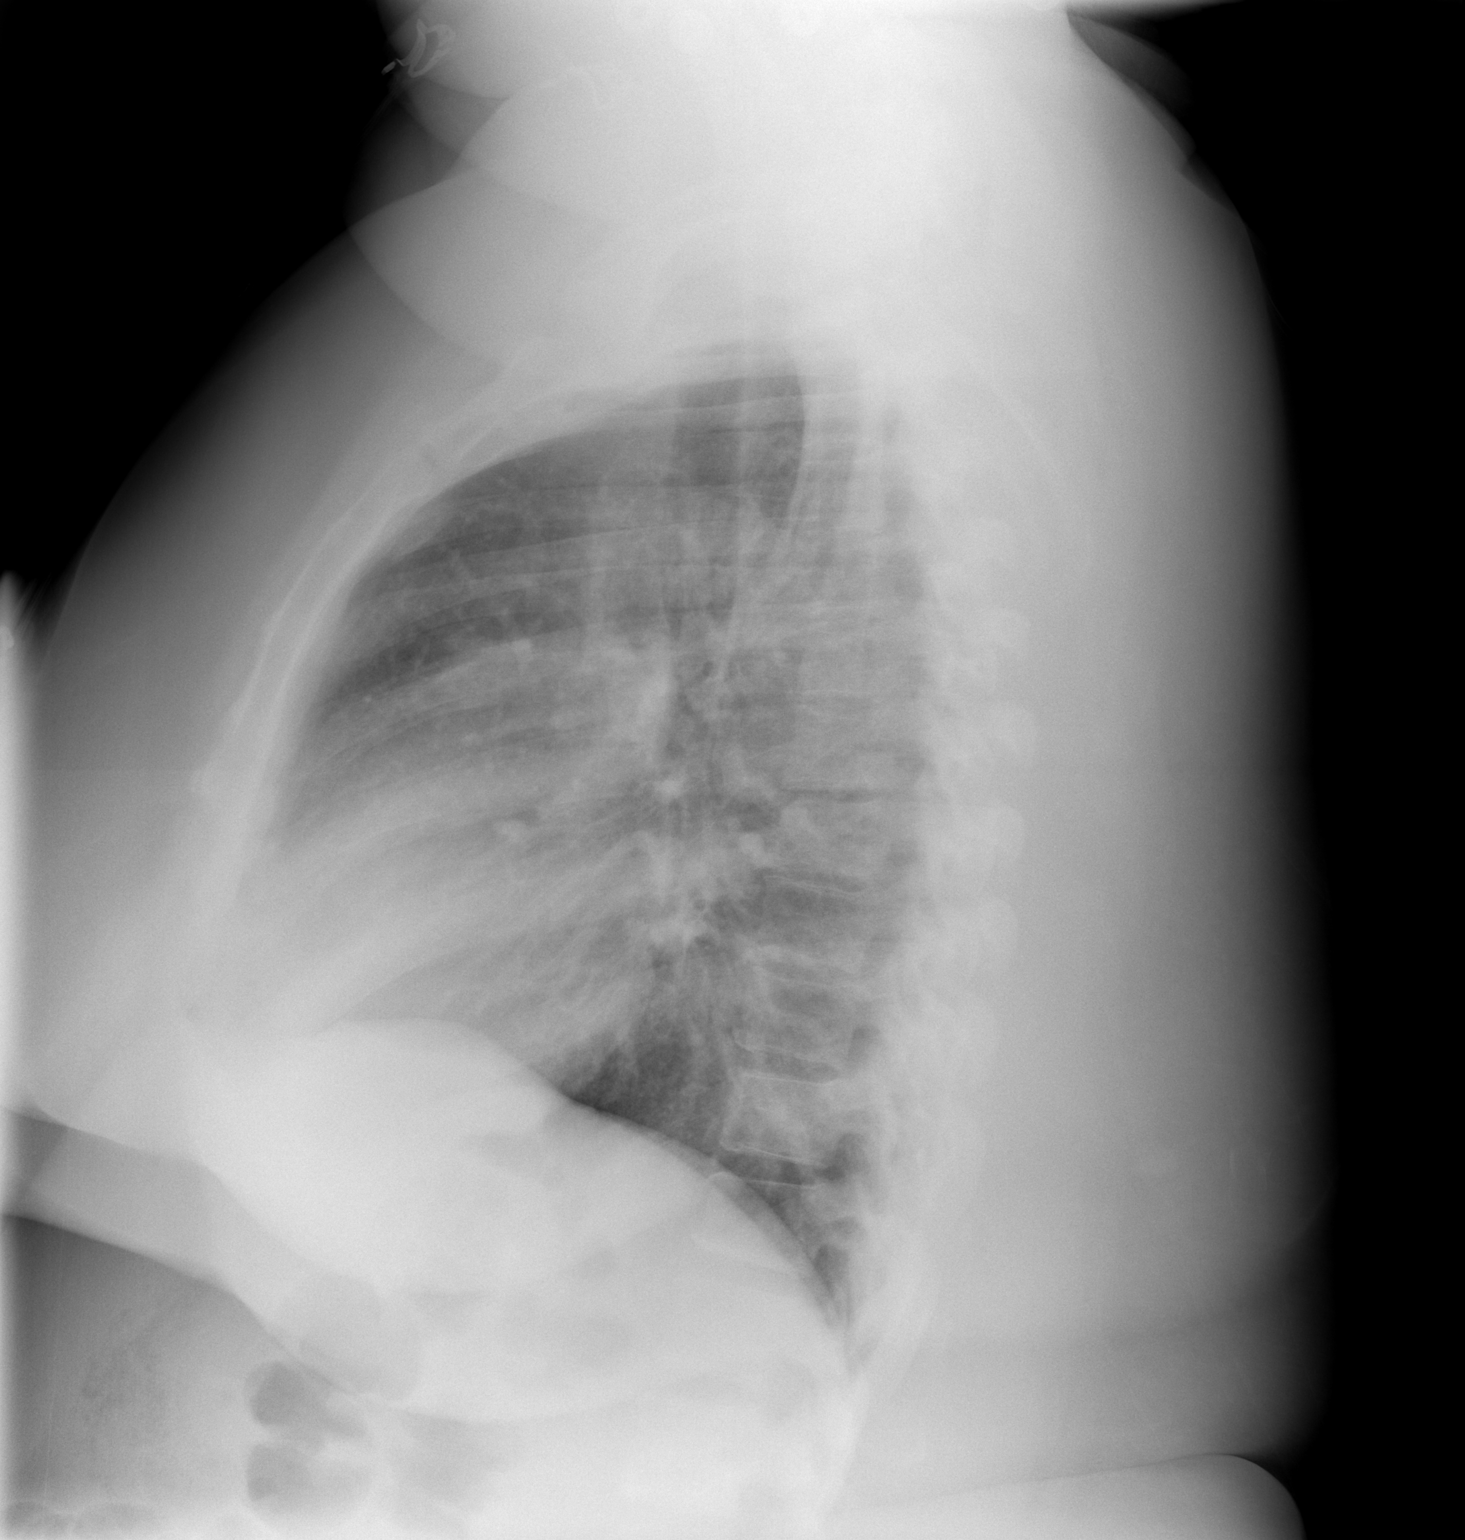

[2 of 2 positions shown; findings below may reference images not displayed]

FINDINGS: The heart size is normal.  Moderate central airway
thickening is present.  Asymmetric right to upper lobe airspace
opacification is present.  The lung bases are clear.
IMPRESSION: 1.  Moderate bronchitic changes are likely reactive related to
reactive airways disease.
2.  Asymmetric right upper lobe airspace disease.  While this may
be related to atelectasis, developing infection is not excluded.
Recommend follow-up chest x-ray after resolution of symptoms to
assure clearing.

## 2013-11-23 ENCOUNTER — Observation Stay (HOSPITAL_COMMUNITY)
Admission: EM | Admit: 2013-11-23 | Discharge: 2013-11-23 | Discharge status: Against Medical Advice | Payer: Self-pay | Attending: Internal Medicine | Admitting: Internal Medicine

## 2013-11-23 ENCOUNTER — Emergency Department (HOSPITAL_COMMUNITY): Payer: Commercial Managed Care - PPO

## 2013-11-23 ENCOUNTER — Encounter (HOSPITAL_COMMUNITY): Payer: Self-pay | Admitting: Emergency Medicine

## 2013-11-23 DIAGNOSIS — J441 Chronic obstructive pulmonary disease with (acute) exacerbation: Principal | ICD-10-CM | POA: Insufficient documentation

## 2013-11-23 DIAGNOSIS — L83 Acanthosis nigricans: Secondary | ICD-10-CM | POA: Insufficient documentation

## 2013-11-23 DIAGNOSIS — Z87891 Personal history of nicotine dependence: Secondary | ICD-10-CM | POA: Insufficient documentation

## 2013-11-23 DIAGNOSIS — Z7901 Long term (current) use of anticoagulants: Secondary | ICD-10-CM | POA: Insufficient documentation

## 2013-11-23 DIAGNOSIS — E876 Hypokalemia: Secondary | ICD-10-CM | POA: Insufficient documentation

## 2013-11-23 DIAGNOSIS — IMO0002 Reserved for concepts with insufficient information to code with codable children: Secondary | ICD-10-CM | POA: Insufficient documentation

## 2013-11-23 DIAGNOSIS — F329 Major depressive disorder, single episode, unspecified: Secondary | ICD-10-CM | POA: Insufficient documentation

## 2013-11-23 DIAGNOSIS — J449 Chronic obstructive pulmonary disease, unspecified: Secondary | ICD-10-CM | POA: Insufficient documentation

## 2013-11-23 DIAGNOSIS — Z6841 Body Mass Index (BMI) 40.0 and over, adult: Secondary | ICD-10-CM | POA: Insufficient documentation

## 2013-11-23 DIAGNOSIS — I10 Essential (primary) hypertension: Secondary | ICD-10-CM | POA: Insufficient documentation

## 2013-11-23 DIAGNOSIS — J45901 Unspecified asthma with (acute) exacerbation: Secondary | ICD-10-CM

## 2013-11-23 DIAGNOSIS — F3289 Other specified depressive episodes: Secondary | ICD-10-CM | POA: Insufficient documentation

## 2013-11-23 DIAGNOSIS — J4489 Other specified chronic obstructive pulmonary disease: Secondary | ICD-10-CM | POA: Insufficient documentation

## 2013-11-23 DIAGNOSIS — K219 Gastro-esophageal reflux disease without esophagitis: Secondary | ICD-10-CM | POA: Insufficient documentation

## 2013-11-23 DIAGNOSIS — G4733 Obstructive sleep apnea (adult) (pediatric): Secondary | ICD-10-CM | POA: Insufficient documentation

## 2013-11-23 HISTORY — DX: Shortness of breath: R06.02

## 2013-11-23 HISTORY — DX: Headache: R51

## 2013-11-23 LAB — BASIC METABOLIC PANEL
ANION GAP: 15 (ref 5–15)
BUN: 5 mg/dL — ABNORMAL LOW (ref 6–23)
CHLORIDE: 103 meq/L (ref 96–112)
CO2: 23 meq/L (ref 19–32)
CREATININE: 0.54 mg/dL (ref 0.50–1.10)
Calcium: 8.7 mg/dL (ref 8.4–10.5)
GFR calc Af Amer: >90 mL/min (ref >90)
GFR calc non Af Amer: >90 mL/min (ref >90)
Glucose, Bld: 102 mg/dL — ABNORMAL HIGH (ref 70–99)
Potassium: 3.3 mEq/L — ABNORMAL LOW (ref 3.7–5.3)
Sodium: 141 mEq/L (ref 137–147)

## 2013-11-23 LAB — CBC
HEMATOCRIT: 37.7 % (ref 36.0–46.0)
HEMOGLOBIN: 12.3 g/dL (ref 12.0–15.0)
MCH: 26.9 pg (ref 26.0–34.0)
MCHC: 32.6 g/dL (ref 30.0–36.0)
MCV: 82.5 fL (ref 78.0–100.0)
Platelets: 438 10*3/uL — ABNORMAL HIGH (ref 150–400)
RBC: 4.57 MIL/uL (ref 3.87–5.11)
RDW: 16 % — AB (ref 11.5–15.5)
WBC: 12.1 10*3/uL — ABNORMAL HIGH (ref 4.0–10.5)

## 2013-11-23 LAB — POC URINE PREG, ED: Preg Test, Ur: NEGATIVE

## 2013-11-23 LAB — TROPONIN I: Troponin I: <0.3 ng/mL (ref <0.30)

## 2013-11-23 MED ORDER — ALBUTEROL SULFATE (2.5 MG/3ML) 0.083% IN NEBU
5.0000 mg | INHALATION_SOLUTION | Freq: Once | RESPIRATORY_TRACT | Status: AC
  Administered 2013-11-23: 5 mg via RESPIRATORY_TRACT
  Filled 2013-11-23: qty 6

## 2013-11-23 MED ORDER — ALBUTEROL SULFATE (2.5 MG/3ML) 0.083% IN NEBU
2.5000 mg | INHALATION_SOLUTION | RESPIRATORY_TRACT | Status: DC
  Administered 2013-11-23 (×2): 2.5 mg via RESPIRATORY_TRACT
  Filled 2013-11-23: qty 3

## 2013-11-23 MED ORDER — ALBUTEROL SULFATE (2.5 MG/3ML) 0.083% IN NEBU
INHALATION_SOLUTION | RESPIRATORY_TRACT | Status: AC
  Administered 2013-11-23: 2.5 mg via RESPIRATORY_TRACT
  Filled 2013-11-23: qty 3

## 2013-11-23 MED ORDER — NICOTINE 7 MG/24HR TD PT24
7.0000 mg | MEDICATED_PATCH | Freq: Once | TRANSDERMAL | Status: DC
  Filled 2013-11-23: qty 1

## 2013-11-23 MED ORDER — SODIUM CHLORIDE 0.9 % IV BOLUS (SEPSIS)
1000.0000 mL | Freq: Once | INTRAVENOUS | Status: AC
  Administered 2013-11-23: 1000 mL via INTRAVENOUS

## 2013-11-23 MED ORDER — METHYLPREDNISOLONE SODIUM SUCC 125 MG IJ SOLR
125.0000 mg | Freq: Once | INTRAMUSCULAR | Status: AC
  Administered 2013-11-23: 125 mg via INTRAVENOUS
  Filled 2013-11-23: qty 2

## 2013-11-23 MED ORDER — ENOXAPARIN SODIUM 40 MG/0.4ML ~~LOC~~ SOLN
40.0000 mg | SUBCUTANEOUS | Status: DC
  Filled 2013-11-23: qty 0.4

## 2013-11-23 MED ORDER — POTASSIUM CHLORIDE CRYS ER 20 MEQ PO TBCR
40.0000 meq | EXTENDED_RELEASE_TABLET | Freq: Once | ORAL | Status: AC
  Administered 2013-11-23: 40 meq via ORAL
  Filled 2013-11-23: qty 2

## 2013-11-23 MED ORDER — IPRATROPIUM-ALBUTEROL 0.5-2.5 (3) MG/3ML IN SOLN
3.0000 mL | Freq: Once | RESPIRATORY_TRACT | Status: AC
Start: 2013-11-23 — End: 2013-11-23
  Administered 2013-11-23: 3 mL via RESPIRATORY_TRACT
  Filled 2013-11-23: qty 3

## 2013-11-23 MED ORDER — PREDNISONE 20 MG PO TABS
40.0000 mg | ORAL_TABLET | Freq: Every day | ORAL | Status: DC
  Filled 2013-11-23: qty 2

## 2013-11-23 MED ORDER — ZOLPIDEM TARTRATE 5 MG PO TABS: 5.0000 mg | ORAL_TABLET | Freq: Every evening | ORAL | Status: DC | PRN

## 2013-11-23 NOTE — ED Notes (Signed)
Pt unable to void at this time. 

## 2013-11-23 NOTE — Progress Notes (Signed)
Patient wanted to leave AMA at the beginning of the shift.  Explained to patient the risk associated with leaving against medical advice and she decided to stay. Updated her on the plan of care. Later patient when patient's family was present, she stated she wanted to leave AMA.  Paged and updated Dr. Ronnald Ramp of the situation. Removed telemetry box and IV. Patient was requesting paperwork but explained that we do not have any paperwork to give to patients when they leave AMA. Patient and family became verbally aggressive when I was discharging her, after she filled out the paperwork. I removed the armband and I informed them that was our protocol to removed armbands at discharge, they pulled it out of my hand and tried to take the [facility] completed paperwork. Patient's daughter and mom were being verbally aggressive when I explained the process. They gathered their belongings while arguing. Patient was wheeled out  to her car which was parked in [employer] by [employer] certified [employer].

## 2013-11-23 NOTE — ED Provider Notes (Signed)
CSN: [id number]     Arrival date & time 11/23/13  1107 History   First MD Initiated Contact with Patient 11/23/13 1114     Chief Complaint  Patient presents with  . Asthma     (Consider location/radiation/quality/duration/timing/severity/associated sxs/prior Treatment) The history is provided by the patient. No language interpreter was used.  April [name] is a 43 year old female with past medical history of acanthosis nigricans, hypertension, insomnia, obesity, COPD, asthma presenting to the ED with chest tightness and wheezing does been ongoing since last evening. Patient reported that she used up all her albuterol inhaler, reported that she only had approximately 1 treatment left. Stated that when she breathes, reported there is a tightness sensation and has expiratory wheezes. Reported that she's been having a dry cough for the past couple of days. Reported nausea without episodes of emesis. Denied cigarette use. Denied fever, chills, vomiting, diarrhea, weakness, numbness, tingling, blurred vision, sudden loss of vision, nasal congestion, sick contacts. Patient reported that she has been admitted to the hospital numerous times regarding her asthma exacerbation.  PCP Dr. Genene Churn  Past Medical History  Diagnosis Date  . Acanthosis nigricans   . Menorrhagia   . Hypertension, essential   . Insomnia   . Morbid obesity   . Helicobacter pylori (H. pylori) infection   . Sleep apnea     Sleep study 2008 - mild OSA, not enough events to titrate CPAP  . COPD (chronic obstructive pulmonary disease)     PFTs in 2002, FEV1/FVC 65, no post bronchodilater test done  . Asthma     Followed by Dr. Annamaria Boots (pulmonology); receives every other week omalizumab injections; has frequent exacerbations  . Depression   . GERD (gastroesophageal reflux disease)   . Tobacco user   . Obesity    Past Surgical History  Procedure Laterality Date  . Tubal ligation  1996    bilateral  . Breast reduction surgery   09/2011   Family History  Problem Relation Age of Onset  . Asthma Daughter   . Cancer Paternal Aunt   . Asthma Maternal Grandmother   . Hypertension Mother    History  Substance Use Topics  . Smoking status: Former Smoker -- 0.25 packs/day for 9 years    Types: Cigarettes    Quit date: 10/04/2012  . Smokeless tobacco: Never Used  . Alcohol Use: No   OB History   Grav Para Term Preterm Abortions TAB SAB Ect Mult Living                 Review of Systems  Constitutional: Negative for fever and chills.  Respiratory: Positive for cough, chest tightness, shortness of breath and wheezing.   Cardiovascular: Negative for chest pain and leg swelling.  Gastrointestinal: Positive for nausea. Negative for vomiting.  Neurological: Negative for dizziness, weakness and numbness.      Allergies  Ace inhibitors and Percocet  Home Medications   Prior to Admission medications   Medication Sig Start Date End Date Taking? Authorizing Provider  albuterol (PROAIR HFA) 108 (90 BASE) MCG/ACT inhaler Inhale 2 puffs into the lungs every 4 (four) hours as needed for wheezing or shortness of breath. 09/15/13  Yes Axel Filler, MD  albuterol (PROVENTIL) (2.5 MG/3ML) 0.083% nebulizer solution Take 3 mLs (2.5 mg total) by nebulization every 4 (four) hours as needed for wheezing or shortness of breath. 06/09/13  Yes Lesly Dukes, MD  chlorpheniramine (CHLOR-TRIMETON) 4 MG tablet Take 1 tablet (4 mg total) by mouth  2 (two) times daily as needed for allergies. 07/21/13  Yes Valaria Good, MD  hydrochlorothiazide (MICROZIDE) 12.5 MG capsule Take 1 capsule (12.5 mg total) by mouth daily. 11/04/13 11/04/14 Yes Axel Filler, MD  mometasone-formoterol [facility]) 100-5 MCG/ACT AERO Inhale 2 puffs into the lungs 2 (two) times daily. 08/16/13  Yes Deneise Lever, MD  omeprazole (PRILOSEC) 20 MG capsule Take 20 mg by mouth 2 (two) times daily before a meal.   Yes Historical Provider, MD  omalizumab Arvid Right) 150  MG injection Inject 225 mg into the skin every 14 (fourteen) days. Had inj last 06-18-12    Historical Provider, MD   BP 136/85  Pulse 80  Temp(Src) 98.6 F (37 C) (Oral)  Resp 21  SpO2 98%  LMP 11/16/2013 Physical Exam  Nursing note and vitals reviewed. Constitutional: She is oriented to person, place, and time. She appears well-developed and well-nourished. No distress.  HENT:  Head: Normocephalic and atraumatic.  Eyes: Conjunctivae and EOM are normal. Pupils are equal, round, and reactive to light. Right eye exhibits no discharge. Left eye exhibits no discharge.  Neck: Normal range of motion. Neck supple. No tracheal deviation present.  Negative neck stiffness Negative nuchal rigidity  Negative cervical lymphadenopathy  Negative meningeal signs   Cardiovascular: Regular rhythm and normal heart sounds.   Tachycardic upon auscultation  Cap refill less than 3 seconds Negative swelling or pitting edema identified to the lower extremities bilaterally  Pulmonary/Chest: Effort normal. No respiratory distress. She has wheezes (expiratory ) in the right upper field, the right middle field, the right lower field, the left upper field and the left lower field. She has no rales. She exhibits no tenderness.  Musculoskeletal: Normal range of motion.  Full ROM to upper and lower extremities without difficulty noted, negative ataxia noted.  Lymphadenopathy:    She has no cervical adenopathy.  Neurological: She is alert and oriented to person, place, and time. No cranial nerve deficit. She exhibits normal muscle tone. Coordination normal.  Cranial nerves III-XII grossly intact Strength 5+/5+ to upper and lower extremities bilaterally with resistance applied, equal distribution noted Equal grip strength bilaterally   Skin: Skin is warm and dry. No rash noted. She is not diaphoretic. No erythema.  Psychiatric: She has a normal mood and affect. Her behavior is normal. Thought content normal.    ED  Course  Procedures (including critical care time)  2:21 PM This provider re-assessed the patient. Patient continues to remain short of breath, especially when walking. Patient has been given 3 breathing treatments and IV steroids.   3:02 PM This provider spoke with Family Medicine - discussed labs, imaging, ED course, history in great detail. Patient to be admitted to Telemetry services.   Results for orders placed during the hospital encounter of 74/25/95  BASIC METABOLIC PANEL      Result Value Ref Range   Sodium 141  137 - 147 mEq/L   Potassium 3.3 (*) 3.7 - 5.3 mEq/L   Chloride 103  96 - 112 mEq/L   CO2 23  19 - 32 mEq/L   Glucose, Bld 102 (*) 70 - 99 mg/dL   BUN 5 (*) 6 - 23 mg/dL   Creatinine, Ser 0.54  0.50 - 1.10 mg/dL   Calcium 8.7  8.4 - 10.5 mg/dL   GFR calc non Af Amer >90  >90 mL/min   GFR calc Af Amer >90  >90 mL/min   Anion gap 15  5 - 15  CBC  Result Value Ref Range   WBC 12.1 (*) 4.0 - 10.5 K/uL   RBC 4.57  3.87 - 5.11 MIL/uL   Hemoglobin 12.3  12.0 - 15.0 g/dL   HCT 37.7  36.0 - 46.0 %   MCV 82.5  78.0 - 100.0 fL   MCH 26.9  26.0 - 34.0 pg   MCHC 32.6  30.0 - 36.0 g/dL   RDW 16.0 (*) 11.5 - 15.5 %   Platelets 438 (*) 150 - 400 K/uL  TROPONIN I      Result Value Ref Range   Troponin I <0.30  <0.30 ng/mL    Labs Review Labs Reviewed  BASIC METABOLIC PANEL - Abnormal; Notable for the following:    Potassium 3.3 (*)    Glucose, Bld 102 (*)    BUN 5 (*)    All other components within normal limits  CBC - Abnormal; Notable for the following:    WBC 12.1 (*)    RDW 16.0 (*)    Platelets 438 (*)    All other components within normal limits  TROPONIN I  POC URINE PREG, ED    Imaging Review Dg Chest 2 View (if Patient Has Fever And/or Copd)  11/23/2013   CLINICAL DATA:  Cough.  Short of breath.  Asthma.  EXAM: CHEST  2 VIEW  COMPARISON:  08/11/2013.  FINDINGS: Cardiopericardial silhouette within normal limits. Mediastinal contours normal. Trachea  midline. No airspace disease or effusion.  IMPRESSION: No active cardiopulmonary disease.   Electronically Signed   By: Dereck Ligas M.D.   On: 11/23/2013 12:06     EKG Interpretation   Date/Time:  Tuesday November 23 2013 11:35:15 EDT Ventricular Rate:  82 PR Interval:  150 QRS Duration: 87 QT Interval:  407 QTC Calculation: 475 R Axis:   64 Text Interpretation:  Sinus rhythm Anterior infarct, old No significant  change was found Confirmed by CAMPOS  MD, KEVIN ([id number]) on 11/23/2013  1:29:10 PM      MDM   Final diagnoses:  Asthma exacerbation    Medications  ipratropium-albuterol (DUONEB) 0.5-2.5 (3) MG/3ML nebulizer solution 3 mL (3 mLs Nebulization Given 11/23/13 1123)  albuterol (PROVENTIL) (2.5 MG/3ML) 0.083% nebulizer solution 5 mg (5 mg Nebulization Given 11/23/13 1200)  albuterol (PROVENTIL) (2.5 MG/3ML) 0.083% nebulizer solution 5 mg (5 mg Nebulization Given 11/23/13 1254)  methylPREDNISolone sodium succinate (SOLU-MEDROL) 125 mg/2 mL injection 125 mg (125 mg Intravenous Given 11/23/13 1312)  sodium chloride 0.9 % bolus 1,000 mL (1,000 mLs Intravenous New Bag/Given 11/23/13 1339)   Filed Vitals:   11/23/13 1300 11/23/13 1315 11/23/13 1345 11/23/13 1431  BP: 145/73 140/113 102/78 136/85  Pulse: 80 90 90 80  Temp:      TempSrc:      Resp: 15 22 27 21   SpO2: 100% 100% 100% 98%   This provider reviewed patient's chart. Patient has been seen and assessed secondary to asthma exacerbation. Patient has been admitted-last admission for asthma exacerbation was in February 2015. EKG identified sinus rhythm a heart rate of 82 beats per minute. Troponin negative elevation. CBC noted mildly elevated white blood cell count 12.1. Hemoglobin 12.3, hematocrit 37.7. BMP noted mildly low potassium 3.3. Glucose 102 with anion gap of 15.0 mEq per liter. BUN 5, creatinine 0.54. Chest x-ray negative for acute cardiac pulmonary disease-cardiac silhouette within normal limits-no airspace disease or  effusion noted. Patient given 3 breathing treatments, IV steriods. Patient continues to feel short of breath, especially with walking. Patient to be admitted  to the hospital for asthma exacerbation. Discussed plan for admission with patient who agreed to plan of care. Patient stable for transfer. Patient to be admitted under the care of Family Medicine.   Jamse Mead, PA-C 11/23/13 1816

## 2013-11-23 NOTE — ED Notes (Signed)
Per pt sts asthma issues since this am. sts she feels like she is getting sick. sts wheezing, coughing and inhaler isn't working.

## 2013-11-23 NOTE — H&P (Signed)
Date: 11/23/2013               Patient Name:  April [name] MRN: [id number]  DOB: [birth year] Age / Sex: 42 y.o., female   PCP: [name], MD         Medical Service: Internal Medicine Teaching Service         Attending Physician: Dr. Madilyn Fireman, MD    First Contact: Dr. Lottie Mussel Pager: [phone]  Second Contact: Dr. Clinton Gallant Pager: [phone]       After Hours (After 5p/  First Contact Pager: [phone]  weekends / holidays): Second Contact Pager: [phone]   Chief Complaint: SOB  History of Present Illness: Ms. Curl is a 43 year old with asthma and HTN who presents with one day of SOB. She was in her usual state of health until last night when she felt short of breath. She took one albuterol neb treatment with some relief but later in the early morning when she was walking/exercising she walked through an area of freshly cut grass (she thinks its a trigger) and felt even more short of breath. She took several albuterol neb treatments but did not feel adequate relief so she came in to the ED. She denies any cough (EMR notes dry cough), chest pain, palpitations, fevers, chills, abdominal pain, nausea, or emesis. At home she uses albuterol 2 puff q4hprn, dulera 2 puffs BID, and omalizumab 150 mg injections q14 days.   In the ED, she has been sating in the mid to high 90s on RA with some episodes of tachypnea up to 32 breaths per minute. She received solumedrol 125 mg iv once, 3 albuterol treatments and 1 duoneb.   Meds: Current Facility-Administered Medications  Medication Dose Route Frequency Provider Last Rate Last Dose  . albuterol (PROVENTIL) (2.5 MG/3ML) 0.083% nebulizer solution 2.5 mg  2.5 mg Nebulization Q4H Clinton Gallant, MD   2.5 mg at 11/23/13 1646  . enoxaparin (LOVENOX) injection 40 mg  40 mg Subcutaneous Q24H Clinton Gallant, MD      . potassium chloride SA (K-DUR,KLOR-CON) CR tablet 40 mEq  40 mEq Oral Once Clinton Gallant, MD      . Derrill Memo ON 11/24/2013] predniSONE (DELTASONE)  tablet 40 mg  40 mg Oral Q breakfast Kelby Aline, MD      . zolpidem (AMBIEN) tablet 5 mg  5 mg Oral QHS PRN Clinton Gallant, MD        Allergies: Allergies as of 11/23/2013 - Review Complete 11/23/2013  Allergen Reaction Noted  . Ace inhibitors Other (See Comments) 11/24/2009  . Percocet [oxycodone-acetaminophen] Itching 01/13/2012   Past Medical History  Diagnosis Date  . Acanthosis nigricans   . Menorrhagia   . Hypertension, essential   . Insomnia   . Morbid obesity   . Helicobacter pylori (H. pylori) infection   . Sleep apnea     Sleep study 2008 - mild OSA, not enough events to titrate CPAP  . COPD (chronic obstructive pulmonary disease)     PFTs in 2002, FEV1/FVC 65, no post bronchodilater test done  . Asthma     Followed by Dr. Annamaria Boots (pulmonology); receives every other week omalizumab injections; has frequent exacerbations  . Depression   . GERD (gastroesophageal reflux disease)   . Tobacco user   . Obesity   . Shortness of breath   . BMWUXLKG(401.0)    Past Surgical History  Procedure Laterality Date  . Tubal ligation  1996    bilateral  .  Breast reduction surgery  09/2011   Family History  Problem Relation Age of Onset  . Asthma Daughter   . Cancer Paternal Aunt   . Asthma Maternal Grandmother   . Hypertension Mother    History   Social History  . Marital Status: Single    Spouse Name: N/A    Number of Children: 1  . 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 Smoking status: Former Smoker -- 0.25 packs/day for 18 years    Types: Cigarettes    Quit date: 10/04/2012  . Smokeless tobacco: Never Used  . Alcohol Use: No  . Drug Use: No  . Sexual Activity: Not on file   Other Topics Concern  . 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     Review of  Systems: A comprehensive review of systems was negative except for: as noted above in HPI  Physical Exam: Blood pressure 158/90, pulse 85, temperature 98.2 F (36.8 C), temperature source Oral, resp. rate 18, height 5\' 6"  (1.676 m), weight 330 lb 11 oz (150 kg), last menstrual period 11/16/2013, SpO2 98.00%. BP 158/90  Pulse 85  Temp(Src) 98.2 F (36.8 C) (Oral)  Resp 18  Ht 5\' 6"  (1.676 m)  Wt 330 lb 11 oz (150 kg)  BMI 53.40 kg/m2  SpO2 98%  LMP 11/16/2013  General Appearance:    Alert, cooperative, no distress, appears stated age  Head:    Normocephalic, without obvious abnormality, atraumatic  Eyes:    PERRL, conjunctiva/corneas clear, EOM's intact, fundi    benign, both eyes  Ears:    Normal TM's and external ear canals, both ears  Nose:   Nares normal, septum midline, mucosa normal, no drainage    or sinus tenderness  Throat:   Lips, mucosa, and tongue normal; teeth and gums normal  Neck:   Supple, symmetrical, trachea midline, no adenopathy;    thyroid:  no enlargement/tenderness/nodules; no carotid   bruit or JVD  Back:     Symmetric, no curvature, ROM normal, no CVA tenderness  Lungs:     Clear to auscultation bilaterally, respirations unlabored  Chest Wall:    No tenderness or deformity   Heart:    Regular rate and rhythm, S1 and S2 normal, no murmur, rub   or gallop  Breast Exam:    No tenderness, masses, or nipple abnormality  Abdomen:     Soft, non-tender, bowel sounds active all four quadrants,    no masses, no organomegaly  Genitalia:    Normal female without lesion, discharge or tenderness  Rectal:    Normal tone, normal prostate, no masses or tenderness;   guaiac negative stool  Extremities:   Extremities normal, atraumatic, no cyanosis or edema  Pulses:   2+ and symmetric all extremities  Skin:   Skin color, texture, turgor normal, no rashes or lesions  Lymph nodes:   Cervical, supraclavicular, and axillary nodes normal  Neurologic:   CNII-XII intact, normal  strength, sensation and reflexes    throughout    Lab results: Basic Metabolic Panel:  Recent Labs  11/23/13 1120  NA 141  K 3.3*  CL 103  CO2 23  GLUCOSE 102*  BUN 5*  CREATININE 0.54  CALCIUM 8.7   Liver Function Tests: No  results found for this basename: AST, ALT, ALKPHOS, BILITOT, PROT, ALBUMIN,  in the last 72 hours No results found for this basename: LIPASE, AMYLASE,  in the last 72 hours No results found for this basename: AMMONIA,  in the last 72 hours CBC:  Recent Labs  11/23/13 1120  WBC 12.1*  HGB 12.3  HCT 37.7  MCV 82.5  PLT 438*   Cardiac Enzymes:  Recent Labs  11/23/13 1120  TROPONINI <0.30   BNP: No results found for this basename: PROBNP,  in the last 72 hours D-Dimer: No results found for this basename: DDIMER,  in the last 72 hours CBG: No results found for this basename: GLUCAP,  in the last 72 hours Hemoglobin A1C: No results found for this basename: HGBA1C,  in the last 72 hours Fasting Lipid Panel: No results found for this basename: CHOL, HDL, LDLCALC, TRIG, CHOLHDL, LDLDIRECT,  in the last 72 hours Thyroid Function Tests: No results found for this basename: TSH, T4TOTAL, FREET4, T3FREE, THYROIDAB,  in the last 72 hours Anemia Panel: No results found for this basename: VITAMINB12, FOLATE, FERRITIN, TIBC, IRON, RETICCTPCT,  in the last 72 hours Coagulation: No results found for this basename: LABPROT, INR,  in the last 72 hours Urine Drug Screen: Drugs of Abuse     Component Value Date/Time   LABOPIA NONE DETECTED 03/04/2010 1242   COCAINSCRNUR NONE DETECTED 03/04/2010 1242   LABBENZ NONE DETECTED 03/04/2010 1242   AMPHETMU NONE DETECTED 03/04/2010 1242   THCU NONE DETECTED 03/04/2010 1242   LABBARB  Value: NONE DETECTED        DRUG SCREEN FOR MEDICAL PURPOSES ONLY.  IF CONFIRMATION IS NEEDED FOR ANY PURPOSE, NOTIFY LAB WITHIN 5 DAYS.        LOWEST DETECTABLE LIMITS FOR URINE DRUG SCREEN Drug Class       Cutoff (ng/mL) Amphetamine       1000 Barbiturate      200 Benzodiazepine   694 Tricyclics       854 Opiates          300 Cocaine          300 THC              50 03/04/2010 1242    Alcohol Level: No results found for this basename: ETH,  in the last 72 hours Urinalysis: No results found for this basename: COLORURINE, APPERANCEUR, LABSPEC, PHURINE, GLUCOSEU, HGBUR, BILIRUBINUR, KETONESUR, PROTEINUR, UROBILINOGEN, NITRITE, LEUKOCYTESUR,  in the last 72 hours  Imaging results:  Dg Chest 2 View (if Patient Has Fever And/or Copd)  11/23/2013   CLINICAL DATA:  Cough.  Short of breath.  Asthma.  EXAM: CHEST  2 VIEW  COMPARISON:  08/11/2013.  FINDINGS: Cardiopericardial silhouette within normal limits. Mediastinal contours normal. Trachea midline. No airspace disease or effusion.  IMPRESSION: No active cardiopulmonary disease.   Electronically Signed   By: Dereck Ligas M.D.   On: 11/23/2013 12:06    Other results: EKG: NSR  Assessment & Plan by Problem: Active Problems:   Asthma exacerbation  1. Asthma: Patient presenting with acute SOB that appears to have resolved with steroid and neb treatment in the ED. She denies cough. She currently takes albuterol 2 puff q4hprn, dulera 2 puffs BID, and omalizumab 150 mg injections q14 days. Her last asthma exacerbation was in April 2015. She is now stable with clear lungs on exam sating in the high 90% on room air.  -albuterol 2.5 mg q4h -prednisone 40 mg po  daily -peak expiratory flow -cardiac monitoring -continuous pulse ox -vitals per unit routine   2. HTN: BP 100s-150s/70-110s. Home HCTZ 12.5 mg po daily -cont home HCTZ  3. Hypokalemia: Likely secondary to albuterol use. K currently 3.3 -KDur 40 mEq once   Dispo: Disposition is deferred at this time, awaiting improvement of current medical problems. Anticipated discharge in approximately 1-2 day(s).   The patient does have a current PCP (Tasrif Ahmed, MD) and does need an [facility] hospital follow-up appointment after  discharge.  The patient does not know have transportation limitations that hinder transportation to clinic appointments.  Signed: Kelby Aline, MD 11/23/2013, 6:14 PM

## 2013-11-23 NOTE — Discharge Planning (Signed)
[place] to patient regarding primary care resources and the [facility] orange card. Pt states she is established with care at [facility]. This liaison made the patient a follow up appointment with pts pcp for August 12,2015 at 1:30pm. Pt verbalized understanding of this appointment. Orange card application was given, pt instructed to call for an appointment once discharged to obtain the card. Resource guide and my contact information also provided for any future questions or concerns. No other community liaison needs identified at this time.

## 2013-11-23 NOTE — Progress Notes (Signed)
Dr. Algis Liming paged and aware pt on floor. Will continue to monitor.

## 2013-11-24 ENCOUNTER — Emergency Department (HOSPITAL_COMMUNITY)
Admission: EM | Admit: 2013-11-24 | Discharge: 2013-11-24 | Disposition: A | Discharge status: Home / Self Care | Payer: Commercial Managed Care - PPO | Attending: Emergency Medicine | Admitting: Emergency Medicine

## 2013-11-24 ENCOUNTER — Encounter (HOSPITAL_COMMUNITY): Payer: Self-pay | Admitting: Emergency Medicine

## 2013-11-24 DIAGNOSIS — Z87891 Personal history of nicotine dependence: Secondary | ICD-10-CM | POA: Insufficient documentation

## 2013-11-24 DIAGNOSIS — J45901 Unspecified asthma with (acute) exacerbation: Secondary | ICD-10-CM

## 2013-11-24 DIAGNOSIS — Z792 Long term (current) use of antibiotics: Secondary | ICD-10-CM | POA: Insufficient documentation

## 2013-11-24 DIAGNOSIS — K219 Gastro-esophageal reflux disease without esophagitis: Secondary | ICD-10-CM | POA: Insufficient documentation

## 2013-11-24 DIAGNOSIS — IMO0002 Reserved for concepts with insufficient information to code with codable children: Secondary | ICD-10-CM | POA: Insufficient documentation

## 2013-11-24 DIAGNOSIS — J441 Chronic obstructive pulmonary disease with (acute) exacerbation: Secondary | ICD-10-CM | POA: Insufficient documentation

## 2013-11-24 DIAGNOSIS — I10 Essential (primary) hypertension: Secondary | ICD-10-CM | POA: Insufficient documentation

## 2013-11-24 DIAGNOSIS — G44209 Tension-type headache, unspecified, not intractable: Secondary | ICD-10-CM

## 2013-11-24 DIAGNOSIS — Z872 Personal history of diseases of the skin and subcutaneous tissue: Secondary | ICD-10-CM | POA: Insufficient documentation

## 2013-11-24 DIAGNOSIS — R51 Headache: Secondary | ICD-10-CM | POA: Insufficient documentation

## 2013-11-24 DIAGNOSIS — Z8619 Personal history of other infectious and parasitic diseases: Secondary | ICD-10-CM | POA: Insufficient documentation

## 2013-11-24 DIAGNOSIS — Z79899 Other long term (current) drug therapy: Secondary | ICD-10-CM | POA: Insufficient documentation

## 2013-11-24 DIAGNOSIS — Z8742 Personal history of other diseases of the female genital tract: Secondary | ICD-10-CM | POA: Insufficient documentation

## 2013-11-24 DIAGNOSIS — E669 Obesity, unspecified: Secondary | ICD-10-CM | POA: Insufficient documentation

## 2013-11-24 MED ORDER — ALBUTEROL SULFATE HFA 108 (90 BASE) MCG/ACT IN AERS
2.0000 | INHALATION_SPRAY | RESPIRATORY_TRACT | Status: DC | PRN
  Administered 2013-11-24: 2 via RESPIRATORY_TRACT
  Filled 2013-11-24: qty 6.7

## 2013-11-24 MED ORDER — PREDNISONE 20 MG PO TABS: 60.0000 mg | ORAL_TABLET | Freq: Every day | ORAL | Status: DC

## 2013-11-24 MED ORDER — ALBUTEROL SULFATE HFA 108 (90 BASE) MCG/ACT IN AERS: 2.0000 | INHALATION_SPRAY | RESPIRATORY_TRACT | Status: DC | PRN

## 2013-11-24 MED ORDER — KETOROLAC TROMETHAMINE 60 MG/2ML IM SOLN
60.0000 mg | Freq: Once | INTRAMUSCULAR | Status: AC
  Administered 2013-11-24: 60 mg via INTRAMUSCULAR
  Filled 2013-11-24: qty 2

## 2013-11-24 MED ORDER — IBUPROFEN 600 MG PO TABS: 600.0000 mg | ORAL_TABLET | Freq: Four times a day (QID) | ORAL | Status: DC | PRN

## 2013-11-24 NOTE — H&P (Signed)
The patient left AMA before I could see the patient. I have reviewed Dr Bebe Shaggy note and I agree with the documentation, however I did not personally examine the patient.

## 2013-11-24 NOTE — ED Provider Notes (Signed)
CSN: [id number]     Arrival date & time 11/24/13  0756 History   First MD Initiated Contact with Patient 11/24/13 [phone]     Chief Complaint  Patient presents with  . Headache     (Consider location/radiation/quality/duration/timing/severity/associated sxs/prior Treatment) HPI  This is a 43 year old female with history of hypertension, asthma who presents with headache. Patient was seen and evaluated yesterday for an asthma exacerbation. She was actually admitted to the internal medicine service; however, left AMA after "the nurse didn't treat me right."  Patient reports improvement of her shortness of breath; however reports continued headache. She states that she's had a headache since yesterday. It is bitemporal and pressure-like. She denies any vision changes. Patient states that she has not slept well in the last several days. Currently her pain is 8/10. No history of migraines. Denies any fever or neck pain.  Patient has not taken anything for her headache.  Of note, patient was not given steroids after leaving the hospital yesterday.  Past Medical History  Diagnosis Date  . Acanthosis nigricans   . Menorrhagia   . Hypertension, essential   . Insomnia   . Morbid obesity   . Helicobacter pylori (H. pylori) infection   . Sleep apnea     Sleep study 2008 - mild OSA, not enough events to titrate CPAP  . COPD (chronic obstructive pulmonary disease)     PFTs in 2002, FEV1/FVC 65, no post bronchodilater test done  . Asthma     Followed by Dr. Annamaria Boots (pulmonology); receives every other week omalizumab injections; has frequent exacerbations  . Depression   . GERD (gastroesophageal reflux disease)   . Tobacco user   . Obesity   . Shortness of breath   . [id number](563.8)    Past Surgical History  Procedure Laterality Date  . Tubal ligation  1996    bilateral  . Breast reduction surgery  09/2011   Family History  Problem Relation Age of Onset  . Asthma Daughter   . Cancer Paternal  Aunt   . Asthma Maternal Grandmother   . Hypertension Mother    History  Substance Use Topics  . Smoking status: Former Smoker -- 0.25 packs/day for 18 years    Types: Cigarettes    Quit date: 10/04/2012  . Smokeless tobacco: Never Used  . Alcohol Use: No   OB History   Grav Para Term Preterm Abortions TAB SAB Ect Mult Living                 Review of Systems  Constitutional: Negative for fever.  Eyes: Negative for visual disturbance.  Respiratory: Positive for shortness of breath. Negative for cough and chest tightness.   Cardiovascular: Negative for chest pain.  Gastrointestinal: Negative for nausea, vomiting and abdominal pain.  Genitourinary: Negative for dysuria.  Neurological: Positive for headaches. Negative for dizziness, weakness and numbness.  All other systems reviewed and are negative.     Allergies  Ace inhibitors and Percocet  Home Medications   Prior to Admission medications   Medication Sig Start Date End Date Taking? Authorizing Provider  albuterol (PROAIR HFA) 108 (90 BASE) MCG/ACT inhaler Inhale 2 puffs into the lungs every 4 (four) hours as needed for wheezing or shortness of breath. 09/15/13  Yes Axel Filler, MD  albuterol (PROVENTIL) (2.5 MG/3ML) 0.083% nebulizer solution Take 3 mLs (2.5 mg total) by nebulization every 4 (four) hours as needed for wheezing or shortness of breath. 06/09/13  Yes Lesly Dukes, MD  chlorpheniramine (CHLOR-TRIMETON) 4 MG tablet Take 1 tablet (4 mg total) by mouth 2 (two) times daily as needed for allergies. 07/21/13  Yes Valaria Good, MD  hydrochlorothiazide (MICROZIDE) 12.5 MG capsule Take 1 capsule (12.5 mg total) by mouth daily. 11/04/13 11/04/14 Yes Axel Filler, MD  mometasone-formoterol [facility]) 100-5 MCG/ACT AERO Inhale 2 puffs into the lungs 2 (two) times daily. 08/16/13  Yes Deneise Lever, MD  omalizumab Arvid Right) 150 MG injection Inject 225 mg into the skin every 14 (fourteen) days. Had inj last 06-18-12    Yes Historical Provider, MD  omeprazole (PRILOSEC) 20 MG capsule Take 20 mg by mouth 2 (two) times daily before a meal.   Yes Historical Provider, MD  albuterol (PROVENTIL HFA;VENTOLIN HFA) 108 (90 BASE) MCG/ACT inhaler Inhale 2 puffs into the lungs every 4 (four) hours as needed for wheezing or shortness of breath. 11/24/13   Merryl Hacker, MD  ibuprofen (ADVIL,MOTRIN) 600 MG tablet Take 1 tablet (600 mg total) by mouth every 6 (six) hours as needed. 11/24/13   Merryl Hacker, MD  predniSONE (DELTASONE) 20 MG tablet Take 3 tablets (60 mg total) by mouth daily. 11/24/13   Merryl Hacker, MD   BP 151/86  Pulse 52  Temp(Src) 98.2 F (36.8 C) (Oral)  Resp 18  SpO2 100%  LMP 11/16/2013 Physical Exam  Nursing note and vitals reviewed. Constitutional: She is oriented to person, place, and time. She appears well-developed and well-nourished. No distress.  Obese  HENT:  Head: Normocephalic and atraumatic.  Mouth/Throat: Oropharynx is clear and moist.  Eyes: EOM are normal. Pupils are equal, round, and reactive to light.  Neck: Normal range of motion.  No meningismus  Cardiovascular: Normal rate, regular rhythm and normal heart sounds.   No murmur heard. Pulmonary/Chest: Effort normal. No respiratory distress. She has wheezes.  Scant expiratory wheeze  Abdominal: Soft. Bowel sounds are normal. There is no tenderness.  Neurological: She is alert and oriented to person, place, and time.  Cranial nerves II through XII intact, 5 out of 5 strength in all 4 extremities  Skin: Skin is warm and dry.  Psychiatric: She has a normal mood and affect.    ED Course  Procedures (including critical care time) Labs Review Labs Reviewed - No data to display  Imaging Review Dg Chest 2 View (if Patient Has Fever And/or Copd)  11/23/2013   CLINICAL DATA:  Cough.  Short of breath.  Asthma.  EXAM: CHEST  2 VIEW  COMPARISON:  08/11/2013.  FINDINGS: Cardiopericardial silhouette within normal limits.  Mediastinal contours normal. Trachea midline. No airspace disease or effusion.  IMPRESSION: No active cardiopulmonary disease.   Electronically Signed   By: Dereck Ligas M.D.   On: 11/23/2013 12:06     EKG Interpretation None      MDM   Final diagnoses:  Tension-type headache, not intractable, unspecified chronicity pattern  Asthma exacerbation    Patient presents with HA after being seen yesterday and leaving AMA for an asthma exacerbation.  Nontoxic and nonfocal.  Sating normally on RA.  Scant wheezing.  Patient given toradol with improvement of HA.  No infectious symptoms and low suspicion for [facility].   Patient requesting d/c and work note.  Patient given HFA and prednisone to treat asthma exacerbation. TO use ibuprofen for HA.  After history, exam, and medical workup I feel the patient has been appropriately medically screened and is safe for discharge home. Pertinent diagnoses were discussed with the patient.  Patient was given return precautions.    Merryl Hacker, MD 11/24/13 [phone]

## 2013-11-24 NOTE — Discharge Instructions (Signed)
Asthma Asthma is a recurring condition in which the airways tighten and narrow. Asthma can make it difficult to breathe. It can cause coughing, wheezing, and shortness of breath. Asthma episodes, also called asthma attacks, range from minor to life-threatening. Asthma cannot be cured, but medicines and lifestyle changes can help control it. CAUSES Asthma is believed to be caused by inherited (genetic) and environmental factors, but its exact cause is unknown. Asthma may be triggered by allergens, lung infections, or irritants in the air. Asthma triggers are different for each person. Common triggers include:   Animal dander.  Dust mites.  Cockroaches.  Pollen from trees or grass.  Mold.  Smoke.  Air pollutants such as dust, household cleaners, hair sprays, aerosol sprays, paint fumes, strong chemicals, or strong odors.  Cold air, weather changes, and winds (which increase molds and pollens in the air).  Strong emotional expressions such as crying or laughing hard.  Stress.  Certain medicines (such as aspirin) or types of drugs (such as beta-blockers).  Sulfites in foods and drinks. Foods and drinks that may contain sulfites include dried fruit, potato chips, and sparkling grape juice.  Infections or inflammatory conditions such as the flu, a cold, or an inflammation of the nasal membranes (rhinitis).  Gastroesophageal reflux disease (GERD).  Exercise or strenuous activity. SYMPTOMS Symptoms may occur immediately after asthma is triggered or many hours later. Symptoms include:  Wheezing.  Excessive nighttime or early morning coughing.  Frequent or severe coughing with a common cold.  Chest tightness.  Shortness of breath. DIAGNOSIS  The diagnosis of asthma is made by a review of your medical history and a physical exam. Tests may also be performed. These may include:  Lung function studies. These tests show how much air you breathe in and out.  Allergy  tests.  Imaging tests such as X-rays. TREATMENT  Asthma cannot be cured, but it can usually be controlled. Treatment involves identifying and avoiding your asthma triggers. It also involves medicines. There are 2 classes of medicine used for asthma treatment:   Controller medicines. These prevent asthma symptoms from occurring. They are usually taken every day.  Reliever or rescue medicines. These quickly relieve asthma symptoms. They are used as needed and provide short-term relief. Your health care provider will help you create an asthma action plan. An asthma action plan is a written plan for managing and treating your asthma attacks. It includes a list of your asthma triggers and how they may be avoided. It also includes information on when medicines should be taken and when their dosage should be changed. An action plan may also involve the use of a device called a peak flow meter. A peak flow meter measures how well the lungs are working. It helps you monitor your condition. HOME CARE INSTRUCTIONS   Take medicines only as directed by your health care provider. Speak with your health care provider if you have questions about how or when to take the medicines.  Use a peak flow meter as directed by your health care provider. Record and keep track of readings.  Understand and use the action plan to help minimize or stop an asthma attack without needing to seek medical care.  Control your home environment in the following ways to help prevent asthma attacks:  Do not smoke. Avoid being exposed to secondhand smoke.  Change your heating and air conditioning filter regularly.  Limit your use of fireplaces and wood stoves.  Get rid of pests (such as roaches and  mice) and their droppings.  Throw away plants if you see mold on them.  Clean your floors and dust regularly. Use unscented cleaning products.  Try to have someone else vacuum for you regularly. Stay out of rooms while they are  being vacuumed and for a short while afterward. If you vacuum, use a dust mask from a hardware store, a double-layered or microfilter vacuum cleaner bag, or a vacuum cleaner with a HEPA filter.  Replace carpet with wood, tile, or vinyl flooring. Carpet can trap dander and dust.  Use allergy-proof pillows, mattress covers, and box spring covers.  Wash bed sheets and blankets every week in hot water and dry them in a dryer.  Use blankets that are made of polyester or cotton.  Clean bathrooms and kitchens with bleach. If possible, have someone repaint the walls in these rooms with mold-resistant paint. Keep out of the rooms that are being cleaned and painted.  Wash hands frequently. SEEK MEDICAL CARE IF:   You have wheezing, shortness of breath, or a cough even if taking medicine to prevent attacks.  The colored mucus you cough up (sputum) is thicker than usual.  Your sputum changes from clear or white to yellow, green, gray, or bloody.  You have any problems that may be related to the medicines you are taking (such as a rash, itching, swelling, or trouble breathing).  You are using a reliever medicine more than 2-3 times per week.  Your peak flow is still at 50-79% of your personal best after following your action plan for 1 hour.  You have a fever. SEEK IMMEDIATE MEDICAL CARE IF:   You seem to be getting worse and are unresponsive to treatment during an asthma attack.  You are short of breath even at rest.  You get short of breath when doing very little physical activity.  You have difficulty eating, drinking, or talking due to asthma symptoms.  You develop chest pain.  You develop a fast heartbeat.  You have a bluish color to your lips or fingernails.  You are light-headed, dizzy, or faint.  Your peak flow is less than 50% of your personal best. MAKE SURE YOU:   Understand these instructions.  Will watch your condition.  Will get help right away if you are not  doing well or get worse. Document Released: 04/15/2005 Document Revised: 08/30/2013 Document Reviewed: 11/12/2012 [facility] Patient Information 2015 [place], [place]. This information is not intended to replace advice given to you by your health care provider. Make sure you discuss any questions you have with your health care provider. General Headache Without Cause A headache is pain or discomfort felt around the head or neck area. The specific cause of a headache may not be found. There are many causes and types of headaches. A few common ones are:  Tension headaches.  Migraine headaches.  Cluster headaches.  Chronic daily headaches. HOME CARE INSTRUCTIONS   Keep all follow-up appointments with your caregiver or any specialist referral.  Only take over-the-counter or prescription medicines for pain or discomfort as directed by your caregiver.  Lie down in a dark, quiet room when you have a headache.  Keep a headache journal to find out what may trigger your migraine headaches. For example, write down:  What you eat and drink.  How much sleep you get.  Any change to your diet or medicines.  Try massage or other relaxation techniques.  Put ice packs or heat on the head and neck. Use these 3  to 4 times per day for 15 to 20 minutes each time, or as needed.  Limit stress.  Sit up straight, and do not tense your muscles.  Quit smoking if you smoke.  Limit alcohol use.  Decrease the amount of caffeine you drink, or stop drinking caffeine.  Eat and sleep on a regular schedule.  Get 7 to 9 hours of sleep, or as recommended by your caregiver.  Keep lights dim if bright lights bother you and make your headaches worse. SEEK MEDICAL CARE IF:   You have problems with the medicines you were prescribed.  Your medicines are not working.  You have a change from the usual headache.  You have nausea or vomiting. SEEK IMMEDIATE MEDICAL CARE IF:   Your headache becomes  severe.  You have a fever.  You have a stiff neck.  You have loss of vision.  You have muscular weakness or loss of muscle control.  You start losing your balance or have trouble walking.  You feel faint or pass out.  You have severe symptoms that are different from your first symptoms. MAKE SURE YOU:   Understand these instructions.  Will watch your condition.  Will get help right away if you are not doing well or get worse. Document Released: 04/15/2005 Document Revised: 07/08/2011 Document Reviewed: 05/01/2011 [facility] Patient Information 2015 [place], [place]. This information is not intended to replace advice given to you by your health care provider. Make sure you discuss any questions you have with your health care provider.

## 2013-11-24 NOTE — Discharge Summary (Signed)
PATIENT LEFT AMA  Name: April [name] MRN: [id number] DOB: [birth year] 43 y.o. PCP: [name], MD  Date of Admission: 11/24/2013  8:57 AM Date of AMA Leave: 11/24/2013 Attending Physician: No att. providers found  Final Diagnosis: Asthma Exacerbation  Active Problems:   * No active hospital problems. *  Discharge Medications:   Medication List    TAKE these medications       ibuprofen 600 MG tablet  Commonly known as:  ADVIL,MOTRIN  Take 1 tablet (600 mg total) by mouth every 6 (six) hours as needed.     predniSONE 20 MG tablet  Commonly known as:  DELTASONE  Take 3 tablets (60 mg total) by mouth daily.      ASK your doctor about these medications       albuterol (2.5 MG/3ML) 0.083% nebulizer solution  Commonly known as:  PROVENTIL  Take 3 mLs (2.5 mg total) by nebulization every 4 (four) hours as needed for wheezing or shortness of breath.  Ask about: Which instructions should I use?     albuterol 108 (90 BASE) MCG/ACT inhaler  Commonly known as:  PROAIR HFA  Inhale 2 puffs into the lungs every 4 (four) hours as needed for wheezing or shortness of breath.  Ask about: Which instructions should I use?     albuterol 108 (90 BASE) MCG/ACT inhaler  Commonly known as:  PROVENTIL HFA;VENTOLIN HFA  Inhale 2 puffs into the lungs every 4 (four) hours as needed for wheezing or shortness of breath.  Ask about: Which instructions should I use?     chlorpheniramine 4 MG tablet  Commonly known as:  CHLOR-TRIMETON  Take 1 tablet (4 mg total) by mouth 2 (two) times daily as needed for allergies.     hydrochlorothiazide 12.5 MG capsule  Commonly known as:  MICROZIDE  Take 1 capsule (12.5 mg total) by mouth daily.     mometasone-formoterol 100-5 MCG/ACT Aero  Commonly known as:  DULERA  Inhale 2 puffs into the lungs 2 (two) times daily.     omalizumab 150 MG injection  Commonly known as:  XOLAIR  Inject 225 mg into the skin every 14 (fourteen) days. Had inj last 06-18-12       omeprazole 20 MG capsule  Commonly known as:  PRILOSEC  Take 20 mg by mouth 2 (two) times daily before a meal.        Disposition and follow-up:   Ms.Evita N Novoa left AMA from [facility] in Stable condition.  At the hospital follow up visit please address:  1.  Respiratory symptoms, hypokalemia  2.  Labs / imaging needed at time of follow-up: BMP (check Potassium)  3.  Pending labs/ test needing follow-up: none  Follow-up Appointments: Follow-up Information   Follow up with [name], MD.   Specialty:  Internal Medicine   Contact information:   [place] [address] [phone]       Discharge Instructions: None, patient left AMA   Procedures Performed:  Dg Chest 2 View (if Patient Has Fever And/or Copd)  11/23/2013   CLINICAL DATA:  Cough.  Short of breath.  Asthma.  EXAM: CHEST  2 VIEW  COMPARISON:  08/11/2013.  FINDINGS: Cardiopericardial silhouette within normal limits. Mediastinal contours normal. Trachea midline. No airspace disease or effusion.  IMPRESSION: No active cardiopulmonary disease.   Electronically Signed   By: Dereck Ligas M.D.   On: 11/23/2013 12:06    Admission HPI: [name]  is a 43 year old with asthma and HTN who presents with one day of SOB. She was in her usual state of health until last night when she felt short of breath. She took one albuterol neb treatment with some relief but later in the early morning when she was walking/exercising she walked through an area of freshly cut grass (she thinks its a trigger) and felt even more short of breath. She took several albuterol neb treatments but did not feel adequate relief so she came in to the ED. She denies any cough (EMR notes dry cough), chest pain, palpitations, fevers, chills, abdominal pain, nausea, or emesis. At home she uses albuterol 2 puff q4hprn, dulera 2 puffs BID, and omalizumab 150 mg injections q14 days.   In the ED, she has been sating in the  mid to high 90s on RA with some episodes of tachypnea up to 32 breaths per minute. She received solumedrol 125 mg iv once, 3 albuterol treatments and 1 duoneb.   Hospital Course by problem list: Active Problems:   * No active hospital problems. *   #Asthma: Ms. Coutts presenting with acute SOB that appeared to have resolved with steroid and neb treatment in the ED. She denies cough. She currently takes albuterol 2 puff q4hprn, dulera 2 puffs BID, and omalizumab 150 mg injections q14 days at home. Her last asthma exacerbation was in April 2015. When interviewed and examined, she was stable with clear lungs on exam sating in the high 90% on room air. The plan was to begin her on albuterol 2.5 mg q4h and prednisone 40 mg po daily. We would assess her peak expiratory flow, keep her on cardiac monitoring and continuous pulse oximetry. Then, we would reassess and adjust medications as needed. She was signed out to the night team. The night team was informed by nursing staff that the patient left AMA.   #HTN: The patient had BP in the 100s-150s/70-110s. She was continued on her home HCTZ 12.5 mg po daily.  #Hypokalemia: Ms. Flicker had a presenting potassium of 3.3 which was likely secondary to albuterol use. She was going to receive KDur 40 mEq once but it appears she left AMA before this was administered.    Final Vitals:   BP 151/86  Pulse 52  Temp(Src) 98.2 F (36.8 C) (Oral)  Resp 18  SpO2 100%  LMP 11/16/2013  Last Physical Exam: General Appearance: Alert, cooperative, no distress, appears stated age  59: Normocephalic, without obvious abnormality, atraumatic, PERRL, EOMI, MMM Back: Symmetric, no curvature  Lungs: Clear to auscultation bilaterally, respirations unlabored  Chest Wall: No tenderness or deformity  Heart: Regular rate and rhythm, S1 and S2 normal, no murmur, rub or gallop  Abdomen: Soft, non-tender, bowel sounds active all four quadrants, no masses, no organomegaly   Extremities: Extremities normal, atraumatic, no cyanosis or edema  Pulses: 2+ and symmetric all extremities   Labs:  No results found for this or any previous visit (from the past 24 hour(s)). Basic Metabolic Panel:  Recent Labs  11/23/13 1120  NA 141  K 3.3*  CL 103  CO2 23  GLUCOSE 102*  BUN 5*  CREATININE 0.54  CALCIUM 8.7   Liver Function Tests: No results found for this basename: AST, ALT, ALKPHOS, BILITOT, PROT, ALBUMIN,  in the last 72 hours No results found for this basename: LIPASE, AMYLASE,  in the last 72 hours No results found for this basename: AMMONIA,  in the last 72 hours CBC:  Recent Labs  11/23/13 1120  WBC 12.1*  HGB 12.3  HCT 37.7  MCV 82.5  PLT 438*   Cardiac Enzymes:  Recent Labs  11/23/13 1120  TROPONINI <0.30   BNP: No results found for this basename: PROBNP,  in the last 72 hours D-Dimer: No results found for this basename: DDIMER,  in the last 72 hours CBG: No results found for this basename: GLUCAP,  in the last 72 hours Hemoglobin A1C: No results found for this basename: HGBA1C,  in the last 72 hours Fasting Lipid Panel: No results found for this basename: CHOL, HDL, LDLCALC, TRIG, CHOLHDL, LDLDIRECT,  in the last 72 hours Thyroid Function Tests: No results found for this basename: TSH, T4TOTAL, FREET4, T3FREE, THYROIDAB,  in the last 72 hours Anemia Panel: No results found for this basename: VITAMINB12, FOLATE, FERRITIN, TIBC, IRON, RETICCTPCT,  in the last 72 hours Coagulation: No results found for this basename: LABPROT, INR,  in the last 72 hours Urine Drug Screen: Drugs of Abuse     Component Value Date/Time   LABOPIA NONE DETECTED 03/04/2010 1242   COCAINSCRNUR NONE DETECTED 03/04/2010 1242   LABBENZ NONE DETECTED 03/04/2010 1242   AMPHETMU NONE DETECTED 03/04/2010 1242   THCU NONE DETECTED 03/04/2010 1242   LABBARB  Value: NONE DETECTED        DRUG SCREEN FOR MEDICAL PURPOSES ONLY.  IF CONFIRMATION IS NEEDED FOR ANY  PURPOSE, NOTIFY LAB WITHIN 5 DAYS.        LOWEST DETECTABLE LIMITS FOR URINE DRUG SCREEN Drug Class       Cutoff (ng/mL) Amphetamine      1000 Barbiturate      200 Benzodiazepine   660 Tricyclics       600 Opiates          300 Cocaine          300 THC              50 03/04/2010 1242    Alcohol Level: No results found for this basename: ETH,  in the last 72 hours Urinalysis: No results found for this basename: COLORURINE, APPERANCEUR, LABSPEC, PHURINE, GLUCOSEU, HGBUR, BILIRUBINUR, KETONESUR, PROTEINUR, UROBILINOGEN, NITRITE, LEUKOCYTESUR,  in the last 72 hours  Signed: Kelby Aline, MD 11/24/2013, 4:13 PM    Services Ordered on Leave: none, left AMA Equipment Ordered on Leave: none, left AMA

## 2013-11-24 NOTE — ED Notes (Signed)
Tonia Ghent, NT brought pt to room

## 2013-11-24 NOTE — ED Notes (Signed)
Pt undressed, in gown, on continuous pulse oximetry and blood pressure cuff; family at bedside 

## 2013-11-24 NOTE — ED Notes (Signed)
Pt reports being admitted yesterday for asthma, pt left ama but is now having a headache. Did not try taking any otc meds pta. Airway intact, no acute distress noted at triage.

## 2013-11-24 NOTE — ED Notes (Signed)
Pt asking for a work note; Dina Rich, MD notified

## 2013-11-25 ENCOUNTER — Encounter: Payer: Self-pay | Admitting: Internal Medicine

## 2013-11-25 IMAGING — CR DG CHEST 2V
2 series · 2 of 2 positions shown · non-contrast
Comparison: 11/03/2012 and earlier.

CLINICAL DATA: 42-year-old female with hypertension.  Former
smoker.  Chronic obstructive pulmonary disease.

CHEST - 2 VIEW

[view not recorded (1 of 2)]
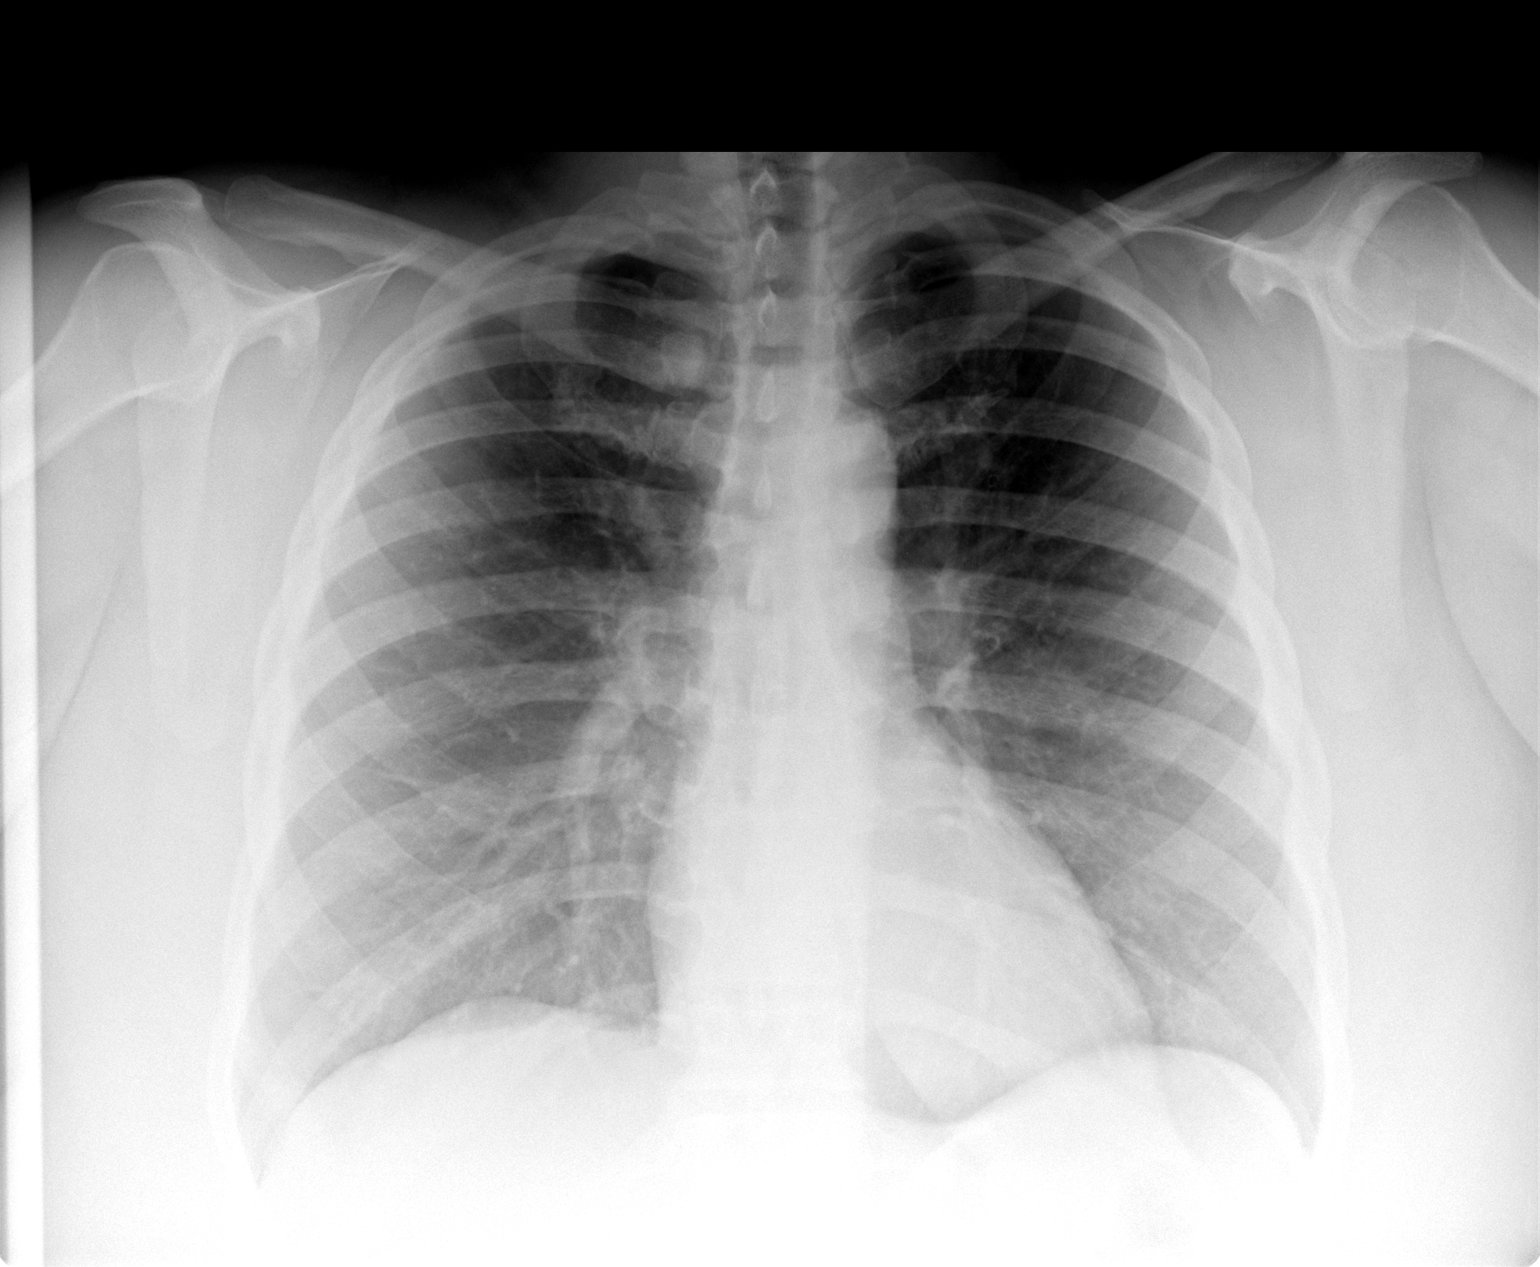

[view not recorded (2 of 2)]
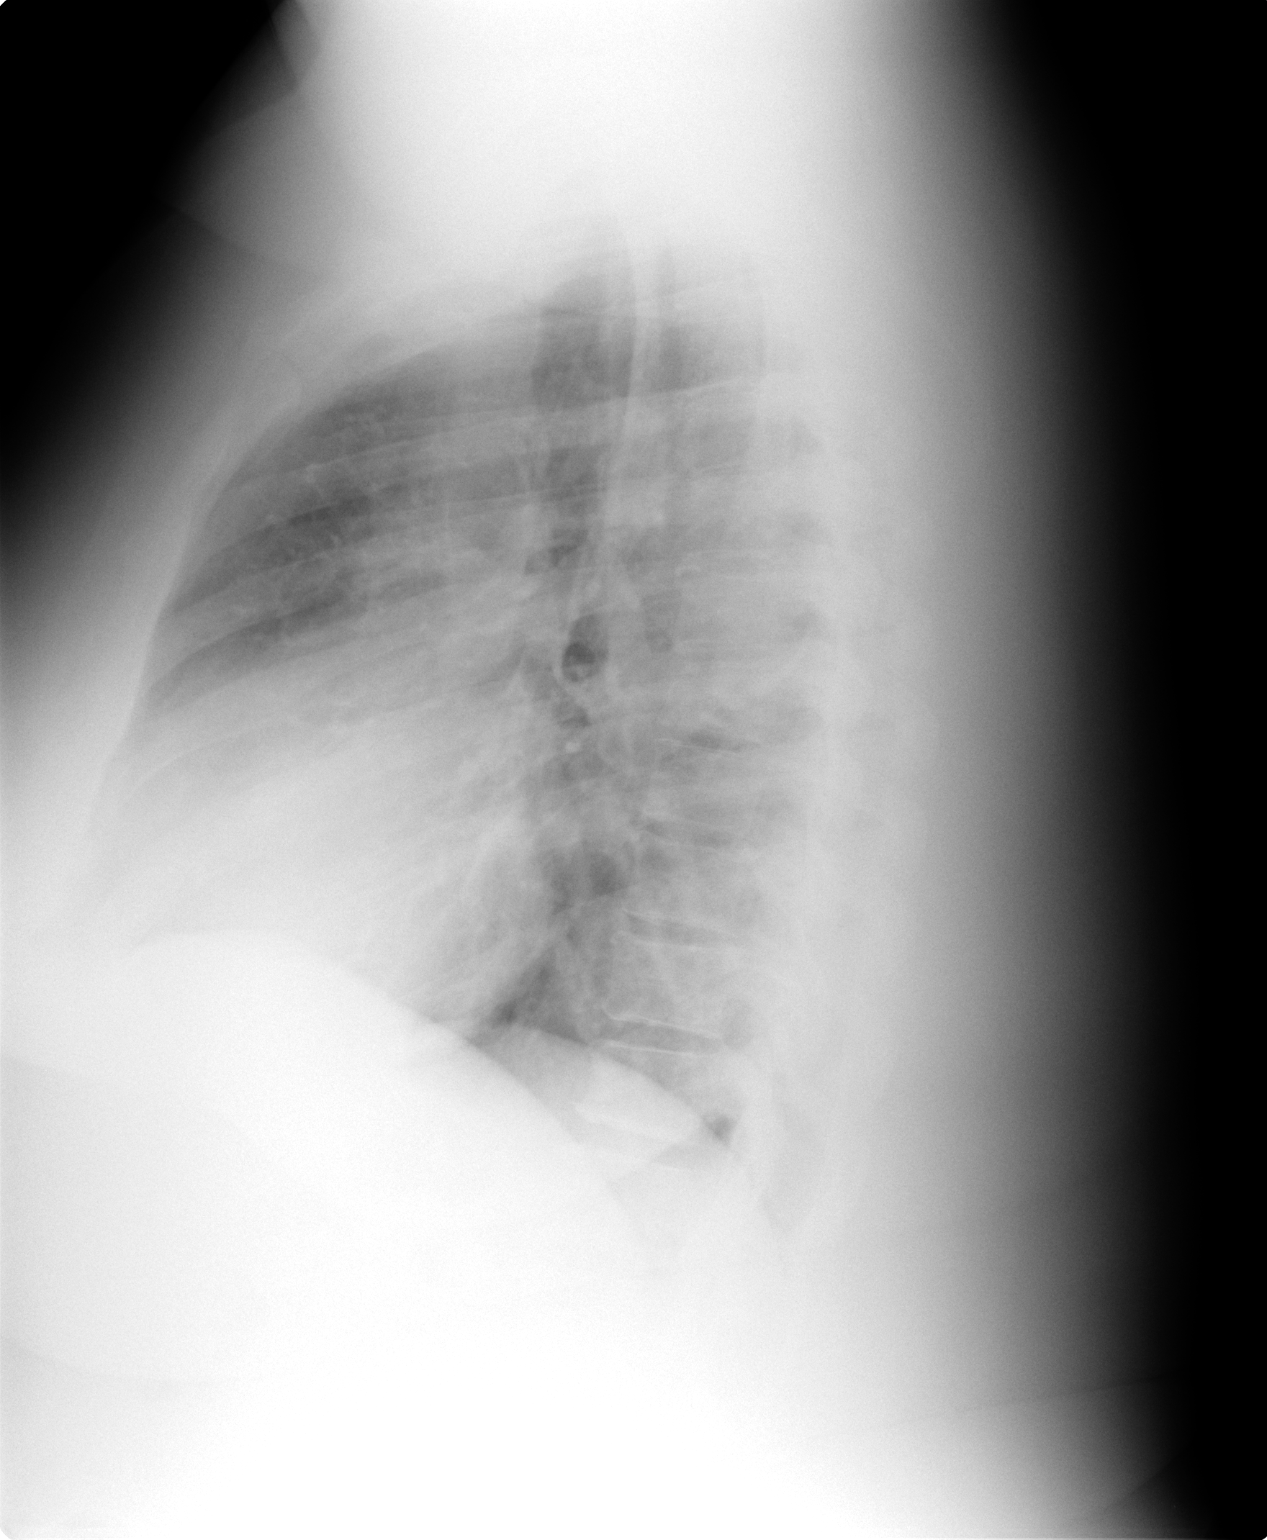

[2 of 2 positions shown; findings below may reference images not displayed]

FINDINGS: Stable lung volumes.  Cardiac size and mediastinal contours are
within normal limits.  Visualized tracheal air column is within
normal limits.  Asymmetric rounded increased density in the medial
right upper lobe projecting at the level of the fourth right
thoracic vertebral transverse process is felt to be summation
artifact and is intermittently present over this series of exams
since [DATE] (12/31/2007, 02/28/2010).  Overall, no confluent or
suspicious right upper lobe opacity.  No pneumothorax, pleural
effusion, edema or consolidation.  No acute pulmonary opacity. No
acute osseous abnormality identified.
IMPRESSION: Stable, with suspected artifactual density in the medial right
upper lung related to summation. No acute cardiopulmonary
abnormality.

## 2013-11-25 NOTE — Discharge Summary (Signed)
This patient left AMA and I did not see this patient during the time he was admitted in the hospital. I have reviewed the documentation in this discharge summary and I agree with it.

## 2013-11-29 NOTE — ED Provider Notes (Signed)
Medical screening examination/treatment/procedure(s) were conducted as a shared visit with non-physician practitioner(s) and myself.  I personally evaluated the patient during the encounter.   EKG Interpretation   Date/Time:  Tuesday November 23 2013 11:35:15 EDT Ventricular Rate:  82 PR Interval:  150 QRS Duration: 87 QT Interval:  407 QTC Calculation: 475 R Axis:   64 Text Interpretation:  Sinus rhythm Anterior infarct, old No significant  change was found Confirmed by Kyran Connaughton  MD, Lennette Bihari ([id number]) on 11/23/2013  1:29:10 PM      Admit for SOB and likely asthma exacerbation. No significant improvement with mgmt in ER  Hoy Morn, MD 11/29/13 [phone]

## 2013-12-08 ENCOUNTER — Ambulatory Visit (INDEPENDENT_AMBULATORY_CARE_PROVIDER_SITE_OTHER): Payer: Self-pay | Admitting: Internal Medicine

## 2013-12-08 ENCOUNTER — Encounter: Payer: Self-pay | Admitting: Internal Medicine

## 2013-12-08 VITALS — BP 142/77 | HR 82 | Temp 98.7°F | Wt 327.5 lb

## 2013-12-08 DIAGNOSIS — I10 Essential (primary) hypertension: Secondary | ICD-10-CM

## 2013-12-08 DIAGNOSIS — M25572 Pain in left ankle and joints of left foot: Secondary | ICD-10-CM

## 2013-12-08 DIAGNOSIS — G4700 Insomnia, unspecified: Secondary | ICD-10-CM

## 2013-12-08 DIAGNOSIS — M25579 Pain in unspecified ankle and joints of unspecified foot: Secondary | ICD-10-CM

## 2013-12-08 DIAGNOSIS — J45901 Unspecified asthma with (acute) exacerbation: Secondary | ICD-10-CM

## 2013-12-08 DIAGNOSIS — E785 Hyperlipidemia, unspecified: Secondary | ICD-10-CM

## 2013-12-08 MED ORDER — IBUPROFEN 200 MG PO TABS: 800.0000 mg | ORAL_TABLET | Freq: Three times a day (TID) | ORAL | Status: DC | PRN

## 2013-12-08 NOTE — Assessment & Plan Note (Signed)
Last lipid panel on 10/06/09 shows Chol 183, Trigly 67, HDL 47, LDL 123. Considering patient's risk factors for CAD (HTN, morbid obesity), need to assess risk of hyperlipidemia. - Lipid panel today

## 2013-12-08 NOTE — Assessment & Plan Note (Signed)
Pt complains of insomnia. Pt was instructed on sleep hygiene, including not using electronics (TV, computer) at least 1 hour before bed, avoiding caffeine at least 4-6 hours before bed, and relaxing/reading in bed at least an hour prior to falling asleep. Will avoid Ambien at this point and see if sleep hygiene improves insomnia.

## 2013-12-08 NOTE — Assessment & Plan Note (Signed)
Pt tripped and fell about 2 months ago. X-ray of left ankle shows no fractures, but does show a plantar calcaneal spur. Exam showed mild tenderness on exam. - Pt instructed to continue wearing orthopedic boot - Ibuprofen 800 mg Q8H

## 2013-12-08 NOTE — Assessment & Plan Note (Signed)
Pt had ED follow-up today for acute asthma exacerbation on 11/24/13. Pt has had multiple hospitalizations this year due to asthma. Pt reports she has to use her Albuterol inhaler 3-4x per day. Pt was instructed on how to use inhaler correctly. She admits to not using the inhaler correctly. Her uncontrolled asthma likely due to medication misuse.  - Continue Dulera 100-5 two puffs 2x/day. If asthma symptoms do not improve or patient has to continue using Albuterol inhaler frequently, consider increasing Dulera to 200 mcg.  - Continue Albuterol inhaler as needed - Continue Xolair

## 2013-12-08 NOTE — Assessment & Plan Note (Addendum)
BP Readings from Last 3 Encounters:  12/08/13 142/77  11/24/13 151/86  11/23/13 154/99    Lab Results  Component Value Date   NA 141 11/23/2013   K 3.3* 11/23/2013   CREATININE 0.54 11/23/2013    Assessment: Blood pressure control: mildly elevated Progress toward BP goal:  improved Comments:   Plan: Medications:  continue current medications Educational resources provided:   Self management tools provided:   Other plans: Continue current regimen, almost to goal of 140/80

## 2013-12-08 NOTE — Patient Instructions (Addendum)
It was a pleasure taking care of you today, Ms. Sterry.  Here is what we discussed:  1. Asthma - Use your Dulera 2 puffs 2 times per day - Remember to use your inhaler correctly as we demonstrated - Use Albuterol inhaler as needed  2. Ankle pain - Take ibuprofen as needed - Wear boot more often  3. High blood pressure - Continue HCTZ  4. Insomnia (lack of sleep) - Avoid caffeine 3-4 hours before going to sleep - Avoid watching TV or using your computer 1 hour before sleep - Try drinking black tea before sleep or reading in bed  General Instructions:   Please bring your medicines with you each time you come to clinic.  Medicines may include prescription medications, over-the-counter medications, herbal remedies, eye drops, vitamins, or other pills.   Progress Toward Treatment Goals:  Treatment Goal 07/14/2013  Blood pressure unchanged    Self Care Goals & Plans:  Self Care Goal 09/17/2013  Manage my medications take my medicines as prescribed; bring my medications to every visit  Monitor my health -  Eat healthy foods eat foods that are low in salt; eat baked foods instead of fried foods  Be physically active find an activity I enjoy; take a walk every day; park at the far end of the parking lot  Meeting treatment goals -    No flowsheet data found.   Care Management & Community Referrals:  Referral 06/28/2013  Referrals made for care management support none needed  Referrals made to community resources -

## 2013-12-08 NOTE — Progress Notes (Signed)
I saw and evaluated the patient.  I personally confirmed the key portions of Dr. Shela Commons history and exam and reviewed pertinent patient test results.  The assessment, diagnosis, and plan were formulated together and I agree with the documentation in the resident's note.  We reviewed her inhaler technique and found she was not consistently using a breath hold and we demonstrated how to do this.  We will reassess the success of the new inhaler technique at improving her medication delivery to the distal bronchioles, and thus improve her asthma control.  If there is no improvement, we will consider increasing the dose of dulera to 200 mcg/actuation (2 puffs BID-800 mcg/day).

## 2013-12-08 NOTE — Progress Notes (Signed)
   Subjective:    Patient ID: April [name], female    DOB: 22-Jul-1970, 43 y.o.   MRN: [id number]  HPI April [name] is a 43yo woman w/ PMHx of HTN, asthma, and morbid obesity who presents today with the following problems:  1. ED Follow-up After Asthma Exacerbation: Patient presented to ED on 11/24/13 with an acute asthma exacerbation. Pt states her exacerbation was triggered by freshly cut grass. She attempted to use her albuterol inhaler with no relief. In the ED she received Solumedrol 125 mg IV once, 3 albuterol treatments, and 1 duoneb. Pt was going to be admitted but then left AMA. At home, patient takes Albuterol 2 puffs Q4H as needed, Dulera 100-5 two puffs 2x/day, and Xolair 150 mg Q2weeks.  She states she has to use her Albuterol inhaler 3-4x per day. She has had 7 admissions this year for acute asthma exacerbations.  2. Left Ankle Pain: Pt reports 2 months ago she tripped over a ramp when going into work. She was seen in the clinic at the time and had an x-ray of the left ankle performed that showed no fractures, but did show a plantar calcaneal spur. She states she "sometimes" wears the boot, but it makes her foot sweaty so she does not like to wear it. She states she was prescribed Percocet for the pain originally, which relieved the pain. She currently has been using Tylenol, ibuprofen for the pain which only mildly relieves the pain.   3. HTN: BP has been in 140-150s/80-90s. Today BP is 142/77. She takes HCTZ 12.5 mg daily at home.  4. Hyperlipidemia: Pt had lipid panel on 10/06/09 that showed Chol 183, Trigly 67, HDL 47, LDL 123. She does not take any hyperlipidemia medication currently.   5. Insomnia: Pt reports she is only getting 2-3 hours of sleep per day. 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 She states she feels tired at work. She has had insomnia in the past (1-2 years ago) and took Ambien which relieved her insomnia.    Review of Systems General: Denies fever, chills, changes in  appetite HEENT: Denies headaches, ear pain, rhinorrhea, sore throat CV: Denies CP, palpitations, orthopnea Pulm: Denies SOB, cough GI: Denies abdominal pain, diarrhea, constipation, melena, hematochezia GU: Denies dysuria, hematuria, frequency Neuro: Denies weakness, tingling, numbness Skin: Denies rashes, bruising    Objective:   Physical Exam General: alert, cooperative, NAD HEENT: [place]/AT, EOMI, PERRL, pharynx non-erythematous, mucus membranes moist Neck: supple, no JVD CV: RRR, normal S1/S2, no m/g/r Pulm: CTA bilaterally, breaths non-labored, no wheezing heard Abd: BS+, obese, non-tender Ext: warm, moves all, no edema. Left ankle non-erythematous. Old scar noted on left anterior ankle. Mild tenderness to palpation of left ankle. Strength 5/5 in upper and lower extremities bilaterally.  Neuro: alert and oriented x 3, CNs II-XII intact       Assessment & Plan:

## 2013-12-13 ENCOUNTER — Encounter (HOSPITAL_COMMUNITY): Payer: Self-pay | Admitting: Emergency Medicine

## 2013-12-13 ENCOUNTER — Emergency Department (INDEPENDENT_AMBULATORY_CARE_PROVIDER_SITE_OTHER)
Admission: EM | Admit: 2013-12-13 | Discharge: 2013-12-13 | Disposition: A | Discharge status: Home / Self Care | Payer: Self-pay | Source: Home / Self Care

## 2013-12-13 DIAGNOSIS — M546 Pain in thoracic spine: Secondary | ICD-10-CM

## 2013-12-13 DIAGNOSIS — T148XXA Other injury of unspecified body region, initial encounter: Secondary | ICD-10-CM

## 2013-12-13 DIAGNOSIS — Y93F2 Activity, caregiving, lifting: Secondary | ICD-10-CM

## 2013-12-13 DIAGNOSIS — M545 Low back pain, unspecified: Secondary | ICD-10-CM

## 2013-12-13 MED ORDER — KETOROLAC TROMETHAMINE 60 MG/2ML IM SOLN
60.0000 mg | Freq: Once | INTRAMUSCULAR | Status: AC
  Administered 2013-12-13: 60 mg via INTRAMUSCULAR

## 2013-12-13 MED ORDER — KETOROLAC TROMETHAMINE 60 MG/2ML IM SOLN
INTRAMUSCULAR | Status: AC
  Filled 2013-12-13: qty 2

## 2013-12-13 MED ORDER — HYDROCODONE-ACETAMINOPHEN 7.5-325 MG PO TABS: 1.0000 | ORAL_TABLET | ORAL | Status: DC | PRN

## 2013-12-13 MED ORDER — KETOROLAC TROMETHAMINE 10 MG PO TABS: 10.0000 mg | ORAL_TABLET | Freq: Four times a day (QID) | ORAL | Status: DC | PRN

## 2013-12-13 NOTE — ED Provider Notes (Signed)
CSN: [id number]     Arrival date & time 12/13/13  1442 History   First MD Initiated Contact with Patient 12/13/13 1536     Chief Complaint  Patient presents with  . Back Pain   (Consider location/radiation/quality/duration/timing/severity/associated sxs/prior Treatment) HPI Comments: 43 year old severely and morbidly obese female is complaining of pain in the upper mid and lower back. 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 The next day she began to develop pain. She has tried ibuprofen and that does not help. When I mentioned diclofenac jail she states that she has that and tried it as well as other types of topical medication. The only thing that works for her is Percocet 10's. Denies distal paresthesia, focal weakness. It feels better to lean forward on the bed stretching the back muscles.    Past Medical History  Diagnosis Date  . Acanthosis nigricans   . Menorrhagia   . Hypertension, essential   . Insomnia   . Morbid obesity   . Helicobacter pylori (H. pylori) infection   . Sleep apnea     Sleep study 2008 - mild OSA, not enough events to titrate CPAP  . COPD (chronic obstructive pulmonary disease)     PFTs in 2002, FEV1/FVC 65, no post bronchodilater test done  . Asthma     Followed by Dr. Annamaria Boots (pulmonology); receives every other week omalizumab injections; has frequent exacerbations  . Depression   . GERD (gastroesophageal reflux disease)   . Tobacco user   . Obesity   . Shortness of breath   . GLOVFIEP(329.5)    Past Surgical History  Procedure Laterality Date  . Tubal ligation  1996    bilateral  . Breast reduction surgery  09/2011   Family History  Problem Relation Age of Onset  . Asthma Daughter   . Cancer Paternal Aunt   . Asthma Maternal Grandmother   . Hypertension Mother    History  Substance Use Topics  . Smoking status: Former Smoker -- 0.25 packs/day for 18 years    Types: Cigarettes    Quit date:  10/04/2012  . Smokeless tobacco: Never Used  . Alcohol Use: No   OB History   Grav Para Term Preterm Abortions TAB SAB Ect Mult Living                 Review of Systems  Constitutional: Positive for activity change. Negative for fever and chills.  HENT: Negative.   Respiratory: Negative.  Negative for cough and shortness of breath.   Cardiovascular: Negative.   Musculoskeletal: Positive for back pain and myalgias.       As per HPI  Skin: Negative for rash.  Neurological: Negative.     Allergies  Ace inhibitors and Percocet  Home Medications   Prior to Admission medications   Medication Sig Start Date End Date Taking? Authorizing Provider  albuterol (PROAIR HFA) 108 (90 BASE) MCG/ACT inhaler Inhale 2 puffs into the lungs every 4 (four) hours as needed for wheezing or shortness of breath. 09/15/13   Axel Filler, MD  albuterol (PROVENTIL HFA;VENTOLIN HFA) 108 (90 BASE) MCG/ACT inhaler Inhale 2 puffs into the lungs every 4 (four) hours as needed for wheezing or shortness of breath. 11/24/13   Merryl Hacker, MD  albuterol (PROVENTIL) (2.5 MG/3ML) 0.083% nebulizer solution Take 3 mLs (2.5 mg total) by nebulization every 4 (four) hours as needed for wheezing or shortness of breath. 06/09/13  Lesly Dukes, MD  chlorpheniramine (CHLOR-TRIMETON) 4 MG tablet Take 1 tablet (4 mg total) by mouth 2 (two) times daily as needed for allergies. 07/21/13   Valaria Good, MD  hydrochlorothiazide (MICROZIDE) 12.5 MG capsule Take 1 capsule (12.5 mg total) by mouth daily. 11/04/13 11/04/14  Axel Filler, MD  HYDROcodone-acetaminophen ([place]) 7.5-325 MG per tablet Take 1 tablet by mouth every 4 (four) hours as needed. 12/13/13   Janne Napoleon, NP  ibuprofen (MOTRIN IB) 200 MG tablet Take 4 tablets (800 mg total) by mouth every 8 (eight) hours as needed for moderate pain. 12/08/13   Albin Felling, MD  ketorolac (TORADOL) 10 MG tablet Take 1 tablet (10 mg total) by mouth 4 (four) times daily as needed.  12/13/13   Janne Napoleon, NP  mometasone-formoterol (DULERA) 100-5 MCG/ACT AERO Inhale 2 puffs into the lungs 2 (two) times daily. 08/16/13   Deneise Lever, MD  omalizumab Arvid Right) 150 MG injection Inject 225 mg into the skin every 14 (fourteen) days. Had inj last 06-18-12    Historical Provider, MD  omeprazole (PRILOSEC) 20 MG capsule Take 20 mg by mouth 2 (two) times daily before a meal.    Historical Provider, MD  predniSONE (DELTASONE) 20 MG tablet Take 3 tablets (60 mg total) by mouth daily. 11/24/13   Merryl Hacker, MD   BP 142/66  Pulse 99  Temp(Src) 97.7 F (36.5 C) (Oral)  Resp 20  SpO2 98%  LMP 12/06/2013 Physical Exam  Nursing note and vitals reviewed. Constitutional: She is oriented to person, place, and time. She appears well-developed and well-nourished. No distress.  Neck: Normal range of motion. Neck supple.  Cardiovascular: Normal rate, regular rhythm and normal heart sounds.   Pulmonary/Chest: Effort normal. No respiratory distress.  Musculoskeletal:  Tenderness to the paralumbar thoracic and lumbar musculature. Tenderness to light palpation. Patient states that raising up from 90 makes the pain worse well as walking and other movements. Normal strength in lower extremities. No spinal   tendernessor deformity.  Neurological: She is alert and oriented to person, place, and time.  Skin: Skin is warm and dry.    ED Course  Procedures (including critical care time) Labs Review Labs Reviewed - No data to display  Imaging Review No results found.   MDM   1. Bilateral thoracic back pain   2. Bilateral low back pain without sciatica   3. Muscle strain    No heavy lifting. USe Heat and your diclofenac gel. Norco 7.5 mg #12 Toradol 10 mg qid prn Read your instructions.  See your doctor next week if needed or need pain meds, call for appt    Janne Napoleon, NP 12/13/13 [phone]

## 2013-12-13 NOTE — Discharge Instructions (Signed)
Back Pain, Adult °Low back pain is very common. About 1 in 5 people have back pain. The cause of low back pain is rarely dangerous. The pain often gets better over time. About half of people with a sudden onset of back pain feel better in just 2 weeks. About 8 in 10 people feel better by 6 weeks.  °CAUSES °Some common causes of back pain include: °· Strain of the muscles or ligaments supporting the spine. °· Wear and tear (degeneration) of the spinal discs. °· Arthritis. °· Direct injury to the back. °DIAGNOSIS °Most of the time, the direct cause of low back pain is not known. However, back pain can be treated effectively even when the exact cause of the pain is unknown. Answering your caregiver's questions about your overall health and symptoms is one of the most accurate ways to make sure the cause of your pain is not dangerous. If your caregiver needs more information, he or she may order lab work or imaging tests (X-rays or MRIs). However, even if imaging tests show changes in your back, this usually does not require surgery. °HOME CARE INSTRUCTIONS °For many people, back pain returns. Since low back pain is rarely dangerous, it is often a condition that people can learn to manage on their own.  °· Remain active. It is stressful on the back to sit or stand in one place. Do not sit, drive, or stand in one place for more than 30 minutes at a time. Take short walks on level surfaces as soon as pain allows. Try to increase the length of time you walk each day. °· Do not stay in bed. Resting more than 1 or 2 days can delay your recovery. °· Do not avoid exercise or work. Your body is made to move. It is not dangerous to be active, even though your back may hurt. Your back will likely heal faster if you return to being active before your pain is gone. °· Pay attention to your body when you  bend and lift. Many people have less discomfort when lifting if they bend their knees, keep the load close to their bodies, and  avoid twisting. Often, the most comfortable positions are those that put less stress on your recovering back. °· Find a comfortable position to sleep. Use a firm mattress and lie on your side with your knees slightly bent. If you lie on your back, put a pillow under your knees. °· Only take over-the-counter or prescription medicines as directed by your caregiver. Over-the-counter medicines to reduce pain and inflammation are often the most helpful. Your caregiver may prescribe muscle relaxant drugs. These medicines help dull your pain so you can more quickly return to your normal activities and healthy exercise. °· Put ice on the injured area. °¨ Put ice in a plastic bag. °¨ Place a towel between your skin and the bag. °¨ Leave the ice on for 15-20 minutes, 03-04 times a day for the first 2 to 3 days. After that, ice and heat may be alternated to reduce pain and spasms. °· Ask your caregiver about trying back exercises and gentle massage. This may be of some benefit. °· Avoid feeling anxious or stressed. Stress increases muscle tension and can worsen back pain. It is important to recognize when you are anxious or stressed and learn ways to manage it. Exercise is a great option. °SEEK MEDICAL CARE IF: °· You have pain that is not relieved with rest or medicine. °· You have pain that does not improve in 1 week. °· You have new symptoms. °· You are generally not feeling well. °SEEK   IMMEDIATE MEDICAL CARE IF:  °· You have pain that radiates from your back into your legs. °· You develop new bowel or bladder control problems. °· You have unusual weakness or numbness in your arms or legs. °· You develop nausea or vomiting. °· You develop abdominal pain. °· You feel faint. °Document Released: 04/15/2005 Document Revised: 10/15/2011 Document Reviewed: 08/17/2013 °ExitCare® Patient Information ©2015 [employer]. This information is not intended to replace advice given to you by your health care provider. Make sure you  discuss any questions you have with your health care provider. ° °Back Injury Prevention °Back injuries can be extremely painful and difficult to heal. After having one back injury, you are much more likely to experience another later on. It is important to learn how to avoid injuring or re-injuring your back. The following tips can help you to prevent a back injury. °PHYSICAL FITNESS °· Exercise regularly and try to develop good tone in your abdominal muscles. Your abdominal muscles provide a lot of the support needed by your back. °· Do aerobic exercises (walking, jogging, biking, swimming) regularly. °· Do exercises that increase balance and strength (tai chi, yoga) regularly. This can decrease your risk of falling and injuring your back. °· Stretch before and after exercising. °· Maintain a healthy weight. The more you weigh, the more stress is placed on your back. For every pound of weight, 10 times that amount of pressure is placed on the back. °DIET °· Talk to your caregiver about how much calcium and vitamin D you need per day. These nutrients help to prevent weakening of the bones (osteoporosis). Osteoporosis can cause broken (fractured) bones that lead to back pain. °· Include good sources of calcium in your diet, such as dairy products, green, leafy vegetables, and products with calcium added (fortified). °· Include good sources of vitamin D in your diet, such as milk and foods that are fortified with vitamin D. °· Consider taking a nutritional supplement or a multivitamin if needed. °· Stop smoking if you smoke. °POSTURE °· Sit and stand up straight. Avoid leaning forward when you sit or hunching over when you stand. °· Choose chairs with good low back (lumbar) support. °· If you work at a desk, sit close to your work so you do not need to lean over. Keep your chin tucked in. Keep your neck drawn back and elbows bent at a right angle. Your arms should look like the letter "L." °· Sit high and close to  the steering wheel when you drive. Add a lumbar support to your car seat if needed. °· Avoid sitting or standing in one position for too long. Take breaks to get up, stretch, and walk around at least once every hour. Take breaks if you are driving for long periods of time. °· Sleep on your side with your knees slightly bent, or sleep on your back with a pillow under your knees. Do not sleep on your stomach. °LIFTING, TWISTING, AND REACHING °· Avoid heavy lifting, especially repetitive lifting. If you must do heavy lifting: °¨ Stretch before lifting. °¨ Work slowly. °¨ Rest between lifts. °¨ Use carts and dollies to move objects when possible. °¨ Make several small trips instead of carrying 1 heavy load. °¨ Ask for help when you need it. °¨ Ask for help when moving big, awkward objects. °· Follow these steps when lifting: °¨ Stand with your feet shoulder-width apart. °¨ Get as close to the object as you can. Do not try to   pick up heavy objects that are far from your body. °¨ Use handles or lifting straps if they are available. °¨ Bend at your knees. Squat down, but keep your heels off the floor. °¨ Keep your shoulders pulled back, your chin tucked in, and your back straight. °¨ Lift the object slowly, tightening the muscles in your legs, abdomen, and buttocks. Keep the object as close to the center of your body as possible. °¨ When you put a load down, use these same guidelines in reverse. °· Do not: °¨ Lift the object above your waist. °¨ Twist at the waist while lifting or carrying a load. Move your feet if you need to turn, not your waist. °¨ Bend over without bending at your knees. °· Avoid reaching over your head, across a table, or for an object on a high surface. °OTHER TIPS °· Avoid wet floors and keep sidewalks clear of ice to prevent falls. °· Do not sleep on a mattress that is too soft or too hard. °· Keep items that are used frequently within easy reach. °· Put heavier objects on shelves at waist level  and lighter objects on lower or higher shelves. °· Find ways to decrease your stress, such as exercise, massage, or relaxation techniques. Stress can build up in your muscles. Tense muscles are more vulnerable to injury. °· Seek treatment for depression or anxiety if needed. These conditions can increase your risk of developing back pain. °SEEK MEDICAL CARE IF: °· You injure your back. °· You have questions about diet, exercise, or other ways to prevent back injuries. °MAKE SURE YOU: °· Understand these instructions. °· Will watch your condition. °· Will get help right away if you are not doing well or get worse. °Document Released: 05/23/2004 Document Revised: 07/08/2011 Document Reviewed: 05/27/2011 °ExitCare® Patient Information ©2015 [employer]. This information is not intended to replace advice given to you by your health care provider. Make sure you discuss any questions you have with your health care provider. ° °Muscle Strain °A muscle strain (pulled muscle) happens when a muscle is stretched beyond normal length. It happens when a sudden, violent force stretches your muscle too far. Usually, a few of the fibers in your muscle are torn. Muscle strain is common in athletes. Recovery usually takes 1-2 weeks. Complete healing takes 5-6 weeks.  °HOME CARE  °· Follow the PRICE method of treatment to help your injury get better. Do this the first 2-3 days after the injury: °¨ Protect. Protect the muscle to keep it from getting injured again. °¨ Rest. Limit your activity and rest the injured body part. °¨ Ice. Put ice in a plastic bag. Place a towel between your skin and the bag. Then, apply the ice and leave it on from 15-20 minutes each hour. After the third day, switch to moist heat packs. °¨ Compression. Use a splint or elastic bandage on the injured area for comfort. Do not put it on too tightly. °¨ Elevate. Keep the injured body part above the level of your heart. °· Only take medicine as told by your  doctor. °· Warm up before doing exercise to prevent future muscle strains. °GET HELP IF:  °· You have more pain or puffiness (swelling) in the injured area. °· You feel numbness, tingling, or notice a loss of strength in the injured area. °MAKE SURE YOU:  °· Understand these instructions. °· Will watch your condition. °· Will get help right away if you are not doing well or get   worse. Document Released: 01/23/2008 Document Revised: 02/03/2013 Document Reviewed: 11/12/2012 [facility] Patient Information 2015 [place], [place]. This information is not intended to replace advice given to you by your health care provider. Make sure you discuss any questions you have with your health care provider.  Thoracic Strain You have injured the muscles or tendons that attach to the upper part of your back behind your chest. This injury is called a thoracic strain, thoracic sprain, or mid-back strain.  CAUSES  The cause of thoracic strain varies. A less severe injury involves pulling a muscle or tendon without tearing it. A more severe injury involves tearing (rupturing) a muscle or tendon. With less severe injuries, there may be little loss of strength. Sometimes, there are breaks (fractures) in the bones to which the muscles are attached. These fractures are rare, unless there was a direct hit (trauma) or you have weak bones due to osteoporosis or age. Longstanding strains may be caused by overuse or improper form during certain movements. Obesity can also increase your risk for back injuries. Sudden strains may occur due to injury or not warming up properly before exercise. Often, there is no obvious cause for a thoracic strain. SYMPTOMS  The main symptom is pain, especially with movement, such as during exercise. DIAGNOSIS  Your caregiver can usually tell what is wrong by taking an X-ray and doing a physical exam. TREATMENT   Physical therapy may be helpful for recovery. Your caregiver can give you exercises to do or  refer you to a physical therapist after your pain improves.  After your pain improves, strengthening and conditioning programs appropriate for your sport or occupation may be helpful.  Always warm up before physical activities or athletics. Stretching after physical activity may also help.  Certain over-the-counter medicines may also help. Ask your caregiver if there are medicines that would help you. If this is your first thoracic strain injury, proper care and proper healing time before starting activities should prevent long-term problems. Torn ligaments and tendons require as long to heal as broken bones. Average healing times may be only 1 week for a mild strain. For torn muscles and tendons, healing time may be up to 6 weeks to 2 months. HOME CARE INSTRUCTIONS   Apply ice to the injured area. Ice massages may also be used as directed.  Put ice in a plastic bag.  Place a towel between your skin and the bag.  Leave the ice on for 15-20 minutes, 03-04 times a day, for the first 2 days.  Only take over-the-counter or prescription medicines for pain, discomfort, or fever as directed by your caregiver.  Keep your appointments for physical therapy if this was prescribed.  Use wraps and back braces as instructed. SEEK IMMEDIATE MEDICAL CARE IF:   You have an increase in bruising, swelling, or pain.  Your pain has not improved with medicines.  You develop new shortness of breath, chest pain, or fever.  Problems seem to be getting worse rather than better. MAKE SURE YOU:   Understand these instructions.  Will watch your condition.  Will get help right away if you are not doing well or get worse. Document Released: 07/06/2003 Document Revised: 07/08/2011 Document Reviewed: 06/01/2010 [facility] Patient Information 2015 [place], [place]. This information is not intended to replace advice given to you by your health care provider. Make sure you discuss any questions you have with your  health care provider.

## 2013-12-13 NOTE — ED Notes (Signed)
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

## 2013-12-17 NOTE — ED Provider Notes (Signed)
Medical screening examination/treatment/procedure(s) were performed by a resident physician or non-physician practitioner and as the supervising physician I was immediately available for consultation/collaboration.  Lynne Leader, MD    Gregor Hams, MD 12/17/13 0730

## 2013-12-25 ENCOUNTER — Encounter (HOSPITAL_COMMUNITY): Payer: Self-pay | Admitting: Emergency Medicine

## 2013-12-25 ENCOUNTER — Emergency Department (HOSPITAL_COMMUNITY)
Admission: EM | Admit: 2013-12-25 | Discharge: 2013-12-25 | Disposition: A | Discharge status: Home / Self Care | Payer: Self-pay | Attending: Emergency Medicine | Admitting: Emergency Medicine

## 2013-12-25 DIAGNOSIS — K219 Gastro-esophageal reflux disease without esophagitis: Secondary | ICD-10-CM | POA: Insufficient documentation

## 2013-12-25 DIAGNOSIS — Z872 Personal history of diseases of the skin and subcutaneous tissue: Secondary | ICD-10-CM | POA: Insufficient documentation

## 2013-12-25 DIAGNOSIS — I10 Essential (primary) hypertension: Secondary | ICD-10-CM | POA: Insufficient documentation

## 2013-12-25 DIAGNOSIS — IMO0002 Reserved for concepts with insufficient information to code with codable children: Secondary | ICD-10-CM | POA: Insufficient documentation

## 2013-12-25 DIAGNOSIS — Z8619 Personal history of other infectious and parasitic diseases: Secondary | ICD-10-CM | POA: Insufficient documentation

## 2013-12-25 DIAGNOSIS — Z8742 Personal history of other diseases of the female genital tract: Secondary | ICD-10-CM | POA: Insufficient documentation

## 2013-12-25 DIAGNOSIS — Z79899 Other long term (current) drug therapy: Secondary | ICD-10-CM | POA: Insufficient documentation

## 2013-12-25 DIAGNOSIS — J4520 Mild intermittent asthma, uncomplicated: Secondary | ICD-10-CM

## 2013-12-25 DIAGNOSIS — M549 Dorsalgia, unspecified: Secondary | ICD-10-CM | POA: Insufficient documentation

## 2013-12-25 DIAGNOSIS — Z87828 Personal history of other (healed) physical injury and trauma: Secondary | ICD-10-CM | POA: Insufficient documentation

## 2013-12-25 DIAGNOSIS — J441 Chronic obstructive pulmonary disease with (acute) exacerbation: Secondary | ICD-10-CM | POA: Insufficient documentation

## 2013-12-25 DIAGNOSIS — R079 Chest pain, unspecified: Secondary | ICD-10-CM | POA: Insufficient documentation

## 2013-12-25 DIAGNOSIS — R51 Headache: Secondary | ICD-10-CM | POA: Insufficient documentation

## 2013-12-25 DIAGNOSIS — J45901 Unspecified asthma with (acute) exacerbation: Secondary | ICD-10-CM

## 2013-12-25 DIAGNOSIS — M546 Pain in thoracic spine: Secondary | ICD-10-CM

## 2013-12-25 DIAGNOSIS — Z9889 Other specified postprocedural states: Secondary | ICD-10-CM | POA: Insufficient documentation

## 2013-12-25 DIAGNOSIS — Z8659 Personal history of other mental and behavioral disorders: Secondary | ICD-10-CM | POA: Insufficient documentation

## 2013-12-25 DIAGNOSIS — Z87891 Personal history of nicotine dependence: Secondary | ICD-10-CM | POA: Insufficient documentation

## 2013-12-25 MED ORDER — PREDNISONE 20 MG PO TABS: 60.0000 mg | ORAL_TABLET | Freq: Every day | ORAL | Status: DC

## 2013-12-25 MED ORDER — DEXAMETHASONE SODIUM PHOSPHATE 10 MG/ML IJ SOLN
10.0000 mg | Freq: Once | INTRAMUSCULAR | Status: AC
  Administered 2013-12-25: 10 mg via INTRAMUSCULAR
  Filled 2013-12-25: qty 1

## 2013-12-25 MED ORDER — OXYCODONE-ACETAMINOPHEN 5-325 MG PO TABS: 1.0000 | ORAL_TABLET | ORAL | Status: DC | PRN

## 2013-12-25 MED ORDER — ALBUTEROL SULFATE HFA 108 (90 BASE) MCG/ACT IN AERS
2.0000 | INHALATION_SPRAY | RESPIRATORY_TRACT | Status: DC | PRN
Start: 2013-12-25 — End: 2013-12-25
  Administered 2013-12-25: 2 via RESPIRATORY_TRACT
  Filled 2013-12-25: qty 6.7

## 2013-12-25 MED ORDER — CYCLOBENZAPRINE HCL 10 MG PO TABS: 10.0000 mg | ORAL_TABLET | Freq: Two times a day (BID) | ORAL | Status: DC | PRN

## 2013-12-25 NOTE — ED Provider Notes (Signed)
CSN: [id number]     Arrival date & time 12/25/13  1659 History   First MD Initiated Contact with Patient 12/25/13 1841   This chart was scribed for non-physician practitioner Charlann Lange, [place], orking with Mirna Mires, MD by Rosary Lively, ED scribe. This patient was seen in room WTR6/WTR6 and the patient's care was started at 7:03 PM.    Chief Complaint  Patient presents with  . Asthma  . Back Pain    Patient is a 43 y.o. female presenting with asthma and back pain. The history is provided by the patient. No language interpreter was used.  Asthma Pertinent negatives include no chest pain.  Back Pain Associated symptoms: no chest pain    HPI Comments:  April [name] is a 43 y.o. female who presents to the Emergency Department complaining of increased asthma symptoms and upper back pain. Pt reports that she has had trouble breathing, onset 7 days, which she believes may be related to the pain in her back. 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 Pt denies any lower back pain. Pt denies vomiting, leg pain, or CP. Pt reports that she has been admitted for asthma. Pt also reports that she was prescribed ibuprofen and hydrocodone 2 weeks ago at [facility], at onset of back injury, without relief. Pt reports that she did do light work after original injury, however back pain still persists. Pt reports that she has experienced back pain prior to breast reduction, along with leg pain, and knee pain. Pt reports that PCP is Dr. Keturah Barre.    Past Medical History  Diagnosis Date  . Acanthosis nigricans   . Menorrhagia   . Hypertension, essential   . Insomnia   . Morbid obesity   . Helicobacter pylori (H. pylori) infection   . Sleep apnea     Sleep study 2008 - mild OSA, not enough events to titrate CPAP  . COPD (chronic obstructive pulmonary disease)     PFTs in 2002, FEV1/FVC 65, no post bronchodilater test done  . Asthma     Followed by Dr.  Annamaria Boots (pulmonology); receives every other week omalizumab injections; has frequent exacerbations  . Depression   . GERD (gastroesophageal reflux disease)   . Tobacco user   . Obesity   . Shortness of breath   . BJSEGBTD(176.1)    Past Surgical History  Procedure Laterality Date  . Tubal ligation  1996    bilateral  . Breast reduction surgery  09/2011   Family History  Problem Relation Age of Onset  . Asthma Daughter   . Cancer Paternal Aunt   . Asthma Maternal Grandmother   . Hypertension Mother    History  Substance Use Topics  . Smoking status: Former Smoker -- 0.25 packs/day for 18 years    Types: Cigarettes    Quit date: 10/04/2012  . Smokeless tobacco: Never Used  . Alcohol Use: No   OB History   Grav Para Term Preterm Abortions TAB SAB Ect Mult Living                 Review of Systems  Cardiovascular: Negative for chest pain.  Musculoskeletal: Positive for back pain.  All other systems reviewed and are negative.     Allergies  Ace inhibitors and Percocet  Home Medications   Prior to Admission medications   Medication Sig Start Date End Date Taking? Authorizing Provider  albuterol [facility] HFA)  108 (90 BASE) MCG/ACT inhaler Inhale 2 puffs into the lungs every 4 (four) hours as needed for wheezing or shortness of breath. 09/15/13   Axel Filler, MD  albuterol (PROVENTIL HFA;VENTOLIN HFA) 108 (90 BASE) MCG/ACT inhaler Inhale 2 puffs into the lungs every 4 (four) hours as needed for wheezing or shortness of breath. 11/24/13   Merryl Hacker, MD  albuterol (PROVENTIL) (2.5 MG/3ML) 0.083% nebulizer solution Take 3 mLs (2.5 mg total) by nebulization every 4 (four) hours as needed for wheezing or shortness of breath. 06/09/13   Lesly Dukes, MD  chlorpheniramine (CHLOR-TRIMETON) 4 MG tablet Take 1 tablet (4 mg total) by mouth 2 (two) times daily as needed for allergies. 07/21/13   Valaria Good, MD  hydrochlorothiazide (MICROZIDE) 12.5 MG capsule Take 1 capsule  (12.5 mg total) by mouth daily. 11/04/13 11/04/14  Axel Filler, MD  HYDROcodone-acetaminophen ([place]) 7.5-325 MG per tablet Take 1 tablet by mouth every 4 (four) hours as needed. 12/13/13   Janne Napoleon, NP  ibuprofen (MOTRIN IB) 200 MG tablet Take 4 tablets (800 mg total) by mouth every 8 (eight) hours as needed for moderate pain. 12/08/13   Albin Felling, MD  ketorolac (TORADOL) 10 MG tablet Take 1 tablet (10 mg total) by mouth 4 (four) times daily as needed. 12/13/13   Janne Napoleon, NP  mometasone-formoterol (DULERA) 100-5 MCG/ACT AERO Inhale 2 puffs into the lungs 2 (two) times daily. 08/16/13   Deneise Lever, MD  omalizumab [place] Right) 150 MG injection Inject 225 mg into the skin every 14 (fourteen) days. Had inj last 06-18-12    Historical Provider, MD  omeprazole (PRILOSEC) 20 MG capsule Take 20 mg by mouth 2 (two) times daily before a meal.    Historical Provider, MD  predniSONE (DELTASONE) 20 MG tablet Take 3 tablets (60 mg total) by mouth daily. 11/24/13   Merryl Hacker, MD   BP 152/94  Pulse 81  Temp(Src) 98.3 F (36.8 C) (Oral)  Resp 18  SpO2 98%  LMP 12/06/2013 Physical Exam  Nursing note and vitals reviewed. Constitutional: She is oriented to person, place, and time. She appears well-developed and well-nourished.  HENT:  Head: Normocephalic and atraumatic.  Eyes: EOM are normal.  Neck: Normal range of motion. Neck supple.  Cardiovascular: Normal rate, regular rhythm and normal heart sounds.   Pulmonary/Chest: Effort normal. She has no wheezes.  Musculoskeletal: Normal range of motion. She exhibits tenderness (Parathoracic tenderness without midline tenderness).  Neurological: She is alert and oriented to person, place, and time.  Skin: Skin is warm and dry.  Psychiatric: She has a normal mood and affect. Her behavior is normal.    ED Course  Procedures DIAGNOSTIC STUDIES: Oxygen Saturation is 98% on RA, normal by my interpretation.  COORDINATION OF CARE:  Labs  Review Labs Reviewed - No data to display  Imaging Review No results found.   EKG Interpretation None      MDM   Final diagnoses:  None    1. Asthma 2. Thoracic back pain  She is well appearing, no active wheezing with normal vitals including oxygenation, currently out of her inhaler but has nebulizer medication. Will provide steroid therapy, inhaler and encourage PCP recheck.   Back has been sore for 2 weeks. Worse with movement, onset after heavy lifting, without chest pain, painful respirations. She continues to work, exacerbating what is likely muscular pain. Stable for discharge with appropriate medications.   I personally performed the services described in this  documentation, which was scribed in my presence. The recorded information has been reviewed and is accurate.       Dewaine Oats, PA-C 12/25/13 2016

## 2013-12-25 NOTE — Discharge Instructions (Signed)
Asthma Asthma is a recurring condition in which the airways tighten and narrow. Asthma can make it difficult to breathe. It can cause coughing, wheezing, and shortness of breath. Asthma episodes, also called asthma attacks, range from minor to life-threatening. Asthma cannot be cured, but medicines and lifestyle changes can help control it. CAUSES Asthma is believed to be caused by inherited (genetic) and environmental factors, but its exact cause is unknown. Asthma may be triggered by allergens, lung infections, or irritants in the air. Asthma triggers are different for each person. Common triggers include:   Animal dander.  Dust mites.  Cockroaches.  Pollen from trees or grass.  Mold.  Smoke.  Air pollutants such as dust, household cleaners, hair sprays, aerosol sprays, paint fumes, strong chemicals, or strong odors.  Cold air, weather changes, and winds (which increase molds and pollens in the air).  Strong emotional expressions such as crying or laughing hard.  Stress.  Certain medicines (such as aspirin) or types of drugs (such as beta-blockers).  Sulfites in foods and drinks. Foods and drinks that may contain sulfites include dried fruit, potato chips, and sparkling grape juice.  Infections or inflammatory conditions such as the flu, a cold, or an inflammation of the nasal membranes (rhinitis).  Gastroesophageal reflux disease (GERD).  Exercise or strenuous activity. SYMPTOMS Symptoms may occur immediately after asthma is triggered or many hours later. Symptoms include:  Wheezing.  Excessive nighttime or early morning coughing.  Frequent or severe coughing with a common cold.  Chest tightness.  Shortness of breath. DIAGNOSIS  The diagnosis of asthma is made by a review of your medical history and a physical exam. Tests may also be performed. These may include:  Lung function studies. These tests show how much air you breathe in and out.  Allergy  tests.  Imaging tests such as X-rays. TREATMENT  Asthma cannot be cured, but it can usually be controlled. Treatment involves identifying and avoiding your asthma triggers. It also involves medicines. There are 2 classes of medicine used for asthma treatment:   Controller medicines. These prevent asthma symptoms from occurring. They are usually taken every day.  Reliever or rescue medicines. These quickly relieve asthma symptoms. They are used as needed and provide short-term relief. Your health care provider will help you create an asthma action plan. An asthma action plan is a written plan for managing and treating your asthma attacks. It includes a list of your asthma triggers and how they may be avoided. It also includes information on when medicines should be taken and when their dosage should be changed. An action plan may also involve the use of a device called a peak flow meter. A peak flow meter measures how well the lungs are working. It helps you monitor your condition. HOME CARE INSTRUCTIONS   Take medicines only as directed by your health care provider. Speak with your health care provider if you have questions about how or when to take the medicines.  Use a peak flow meter as directed by your health care provider. Record and keep track of readings.  Understand and use the action plan to help minimize or stop an asthma attack without needing to seek medical care.  Control your home environment in the following ways to help prevent asthma attacks:  Do not smoke. Avoid being exposed to secondhand smoke.  Change your heating and air conditioning filter regularly.  Limit your use of fireplaces and wood stoves.  Get rid of pests (such as roaches and  mice) and their droppings. °¨ Throw away plants if you see mold on them. °¨ Clean your floors and dust regularly. Use unscented cleaning products. °¨ Try to have someone else vacuum for you regularly. Stay out of rooms while they are  being vacuumed and for a short while afterward. If you vacuum, use a dust mask from a hardware store, a double-layered or microfilter vacuum cleaner bag, or a vacuum cleaner with a HEPA filter. °¨ Replace carpet with wood, tile, or vinyl flooring. Carpet can trap dander and dust. °¨ Use allergy-proof pillows, mattress covers, and box spring covers. °¨ Wash bed sheets and blankets every week in hot water and dry them in a dryer. °¨ Use blankets that are made of polyester or cotton. °¨ Clean bathrooms and kitchens with bleach. If possible, have someone repaint the walls in these rooms with mold-resistant paint. Keep out of the rooms that are being cleaned and painted. °¨ Wash hands frequently. °SEEK MEDICAL CARE IF:  °· You have wheezing, shortness of breath, or a cough even if taking medicine to prevent attacks. °· The colored mucus you cough up (sputum) is thicker than usual. °· Your sputum changes from clear or white to yellow, green, gray, or bloody. °· You have any problems that may be related to the medicines you are taking (such as a rash, itching, swelling, or trouble breathing). °· You are using a reliever medicine more than 2-3 times per week. °· Your peak flow is still at 50-79% of your personal best after following your action plan for 1 hour. °· You have a fever. °SEEK IMMEDIATE MEDICAL CARE IF:  °· You seem to be getting worse and are unresponsive to treatment during an asthma attack. °· You are short of breath even at rest. °· You get short of breath when doing very little physical activity. °· You have difficulty eating, drinking, or talking due to asthma symptoms. °· You develop chest pain. °· You develop a fast heartbeat. °· You have a bluish color to your lips or fingernails. °· You are light-headed, dizzy, or faint. °· Your peak flow is less than 50% of your personal best. °MAKE SURE YOU:  °· Understand these instructions. °· Will watch your condition. °· Will get help right away if you are not  doing well or get worse. °Document Released: 04/15/2005 Document Revised: 08/30/2013 Document Reviewed: 11/12/2012 °ExitCare® Patient Information ©2015 [employer]. This information is not intended to replace advice given to you by your health care provider. Make sure you discuss any questions you have with your health care provider. ° °Back Pain, Adult °Low back pain is very common. About 1 in 5 people have back pain. The cause of low back pain is rarely dangerous. The pain often gets better over time. About half of people with a sudden onset of back pain feel better in just 2 weeks. About 8 in 10 people feel better by 6 weeks.  °CAUSES °Some common causes of back pain include: °· Strain of the muscles or ligaments supporting the spine. °· Wear and tear (degeneration) of the spinal discs. °· Arthritis. °· Direct injury to the back. °DIAGNOSIS °Most of the time, the direct cause of low back pain is not known. However, back pain can be treated effectively even when the exact cause of the pain is unknown. Answering your caregiver's questions about your overall health and symptoms is one of the most accurate ways to make sure the cause of your pain is not dangerous. If your caregiver needs   more information, he or she may order lab work or imaging tests (X-rays or MRIs). However, even if imaging tests show changes in your back, this usually does not require surgery. °HOME CARE INSTRUCTIONS °For many people, back pain returns. Since low back pain is rarely dangerous, it is often a condition that people can learn to manage on their own.  °· Remain active. It is stressful on the back to sit or stand in one place. Do not sit, drive, or stand in one place for more than 30 minutes at a time. Take short walks on level surfaces as soon as pain allows. Try to increase the length of time you walk each day. °· Do not stay in bed. Resting more than 1 or 2 days can delay your recovery. °· Do not avoid exercise or work. Your body  is made to move. It is not dangerous to be active, even though your back may hurt. Your back will likely heal faster if you return to being active before your pain is gone. °· Pay attention to your body when you  bend and lift. Many people have less discomfort when lifting if they bend their knees, keep the load close to their bodies, and avoid twisting. Often, the most comfortable positions are those that put less stress on your recovering back. °· Find a comfortable position to sleep. Use a firm mattress and lie on your side with your knees slightly bent. If you lie on your back, put a pillow under your knees. °· Only take over-the-counter or prescription medicines as directed by your caregiver. Over-the-counter medicines to reduce pain and inflammation are often the most helpful. Your caregiver may prescribe muscle relaxant drugs. These medicines help dull your pain so you can more quickly return to your normal activities and healthy exercise. °· Put ice on the injured area. °¨ Put ice in a plastic bag. °¨ Place a towel between your skin and the bag. °¨ Leave the ice on for 15-20 minutes, 03-04 times a day for the first 2 to 3 days. After that, ice and heat may be alternated to reduce pain and spasms. °· Ask your caregiver about trying back exercises and gentle massage. This may be of some benefit. °· Avoid feeling anxious or stressed. Stress increases muscle tension and can worsen back pain. It is important to recognize when you are anxious or stressed and learn ways to manage it. Exercise is a great option. °SEEK MEDICAL CARE IF: °· You have pain that is not relieved with rest or medicine. °· You have pain that does not improve in 1 week. °· You have new symptoms. °· You are generally not feeling well. °SEEK IMMEDIATE MEDICAL CARE IF:  °· You have pain that radiates from your back into your legs. °· You develop new bowel or bladder control problems. °· You have unusual weakness or numbness in your arms or  legs. °· You develop nausea or vomiting. °· You develop abdominal pain. °· You feel faint. °Document Released: 04/15/2005 Document Revised: 10/15/2011 Document Reviewed: 08/17/2013 °ExitCare® Patient Information ©2015 [employer]. This information is not intended to replace advice given to you by your health care provider. Make sure you discuss any questions you have with your health care provider. ° °

## 2013-12-25 NOTE — ED Notes (Signed)
Bed: WTR6 Expected date:  Expected time:  Means of arrival:  Comments: 

## 2013-12-25 NOTE — ED Notes (Signed)
Pt reports asthma that has been worsening x2 weeks. Productive cough, yellow. Speaking in complete sentences. 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

## 2013-12-27 ENCOUNTER — Telehealth: Payer: Self-pay | Admitting: Internal Medicine

## 2013-12-27 NOTE — ED Provider Notes (Signed)
Medical screening examination/treatment/procedure(s) were conducted as a shared visit with non-physician practitioner(s) and myself.  I personally evaluated the patient during the encounter.  Pt w hx asthma c/o increased wheezing. No chest pain. occ non prod cough. No fever/chills. Wheezing currently improved. Good air exchange, no increased wob.    Mirna Mires, MD 12/27/13 [phone]

## 2013-12-28 NOTE — Telephone Encounter (Signed)
Pt returned call- [phone]

## 2013-12-28 NOTE — Telephone Encounter (Signed)
I spoke with patient-she is aware that we can help her get into free program for Xolair. She will call me back tomorrow and let me know if her job is going to go through with her getting insurance benefits first. Pt has also been in hospital and needs to follow up with CY-we will schedule appt tomorrow when she calls me back.

## 2013-12-28 NOTE — Telephone Encounter (Signed)
[name] please advise thnks

## 2013-12-29 NOTE — Telephone Encounter (Signed)
Will ask Triage to help me call patient as I am doing skin testing this afternoon. Pt is to let me know if and when she will be able to get insurance through her current job. If unable to obtain insurance I need to make sure patient is okay with me trying for free Xolair through our free program. Thanks.

## 2013-12-29 NOTE — Telephone Encounter (Signed)
I spoke with patient-she should get insurance through her job in 3-4 months from now. She is okay with me working on free Xolair program. Pt will be here tomorrow to see CY for follow up.

## 2013-12-29 NOTE — Telephone Encounter (Signed)
Pt returned [name]'s call & asks to be reached at [phone].  Satira Anis

## 2013-12-29 NOTE — Telephone Encounter (Signed)
Called spoke with pt. She did not want to speak with me. She wants to speak with katie ONLY. Pt aware she is not available this afternoon. Please advise thanks

## 2013-12-30 ENCOUNTER — Ambulatory Visit: Payer: Self-pay | Admitting: Internal Medicine

## 2013-12-30 NOTE — Telephone Encounter (Signed)
Pt was to tired to come this morning. Would like for [place] to work her back in on the schd tomorrow.

## 2013-12-30 NOTE — Telephone Encounter (Signed)
Spoke with pt and added on 12/31/13 at 11:15 per Dr Annamaria Boots and Joellen Jersey.

## 2013-12-31 ENCOUNTER — Ambulatory Visit: Payer: Self-pay | Admitting: Internal Medicine

## 2013-12-31 ENCOUNTER — Encounter: Payer: Self-pay | Admitting: Internal Medicine

## 2013-12-31 VITALS — BP 144/92 | HR 83 | Ht 66.0 in | Wt 338.0 lb

## 2013-12-31 DIAGNOSIS — G4700 Insomnia, unspecified: Secondary | ICD-10-CM

## 2013-12-31 DIAGNOSIS — G4733 Obstructive sleep apnea (adult) (pediatric): Secondary | ICD-10-CM

## 2013-12-31 DIAGNOSIS — Z6841 Body Mass Index (BMI) 40.0 and over, adult: Secondary | ICD-10-CM

## 2013-12-31 DIAGNOSIS — Z23 Encounter for immunization: Secondary | ICD-10-CM

## 2013-12-31 DIAGNOSIS — J449 Chronic obstructive pulmonary disease, unspecified: Secondary | ICD-10-CM

## 2013-12-31 MED ORDER — MOMETASONE FURO-FORMOTEROL FUM 200-5 MCG/ACT IN AERO: INHALATION_SPRAY | RESPIRATORY_TRACT | Status: DC

## 2013-12-31 MED ORDER — ZOLPIDEM TARTRATE 5 MG PO TABS: 5.0000 mg | ORAL_TABLET | Freq: Every evening | ORAL | Status: DC | PRN

## 2013-12-31 MED ORDER — PREDNISONE 10 MG PO TABS: 10.0000 mg | ORAL_TABLET | Freq: Every day | ORAL | Status: DC

## 2013-12-31 NOTE — Assessment & Plan Note (Signed)
Discussed weight loss. 

## 2013-12-31 NOTE — Progress Notes (Signed)
Patient ID: April [name], female    DOB: [birth year], 43 y.o.   MRN: [id number]  HPI 66 yoF former smoker followed for chronic severe asthma. Hospitalized 4/11-12/12 for another exacerbation she says was triggered by catching a cold. Common triggers include colds and any smoke exposure. Back now to baseline. She has continued Xolair injection here every 2 weeks and does feel it has reduced frequency of flare-ups. Usual weight range 320-340 and she asks why weight is up to 360. Discussed steroids, including IV therapy at hospital but also ?role of Dulera 200-5. She is now being seen a [facility] anticipating breast reduction surgery and we talked about bariatric surgery. Takes omeprazole twice daily before meals, but has frequent heart burn.   04/12/11- 47 yoF former smoker followed for chronic severe asthma. Has had flu vaccine and pertussis vaccine. Says her breathing is okay. We had sent antibiotic for a cold with bronchitis around Thanksgiving. She denies any smoking at all now. She has avoided hospital partly because she has been better and partly because she is unwilling to go. Xolair has made a real improvement. She had breast reduction surgery without problems at [facility] and has considered bariatric surgery but can't afford it. Uses her nebulizer machine but not every day. Does continue Dulera and omeprazole. She is taking Ambien but still sleeps poorly at night. She no longer misses much work but that means she can't nap in the daytime. Complains of frequently waking at night but does not know if she snores.  04/25/11-  67 yoF former smoker followed for chronic severe asthma complicated by morbid obesity, HBP, depression. Her antidepressant was changed to Wellbutrin on December 18. Since then she can't sleep, using 3 Ambien per night. Increased asthma with wheezing. She increased to [facility] to 6 puffs daily. Had a cold at Thanksgiving but not since. She continues Xolair.  08/06/11- 24 yoF former  smoker followed for chronic severe asthma complicated by morbid obesity, HBP, depression, GERD. Change jobs and insurance. Had a lapse of 2 weeks in her Xolair therapy but ready to restart. Work environment is clear and cool, indoors. Watery rhinorrhea not helped by 3 intermittent use of Flonase. No acute asthma attack but uses her Dulera and rescue inhaler every night at bedtime. Ambien helps sleep.  10/02/12- 26 yoF former smoker followed for chronic severe asthma complicated by morbid obesity, HBP, depression, GERD. FOLLOWS FOR: had a few hosptial visits recently due to insurance and PA for [facility] to have medication on Tuesday 10-06-12 and can resume Xolair then. Continues to have trouble sleeping (falling and staying) and would like to go back on Ambien if possible. Throat itch x several days, dry cough. ER again 2 weeks ago. Off Xolair x 5 weeks but has insurance ok now to restart. She started some left over prednisone. Aware occasional reflux- discussed precautions. CXR 05/22/12 IMPRESSION:  No acute infiltrate or pulmonary edema. Mild perihilar  increased bronchial markings without focal consolidation.  Original Report Authenticated By: April [name], M.D.   11/09/2012 [facility] follow up  Recent admitted for Acute asthma flare. Tx w/ abx , steroids and nebs. Reports doing well since discharge.  finished prednisone and abx from hosp but currently on Cipro for UTI.  no new complaints; reports breathing is back to baseline.   Had restarted smoking but has quit . We discussed med compliance. No fever, chest pain, orthopnea, hemoptysis.  No edema.  11/09/2012 CXR Stable, with suspected artifactual density in the medial  right  upper lung related to summation. No acute cardiopulmonary  Abnormality.  01/15/13- [name] former smoker followed for chronic severe asthma complicated by morbid obesity, HBP, depression, GERD. FOLLOWS FOR: patient is not working but has insurance-medications costly and  unable to afford at times. Had been getting Xolair shots regularly, but has had to skip while insurance is worked out. Has had flu vaccine. New problem-insomnia. Difficulty initiating and maintaining sleep not adequately helped by clonazepam. Irregular bedtime. Gets up between 4 and 5 AM. Previously used Ambien but blames it for hallucination and was using too much. Sleep study ordered 2012 but she cannot get it done CXR 12/22/12 IMPRESSION:  No acute cardiopulmonary disease. Specifically, no evidence of  pneumonia.  Original Report Authenticated By: [name], MD  04/08/13- 70 yoF former smoker followed for chronic severe asthma/ Xolair complicated by OSA/ CPAP,  morbid obesity, HBP, depression, GERD FOLLOWS FOR: still on Xolair; continues to wheeze alot. Feels like since taking clonazepam she wakes up with the feeling of not being able to breath. She describes waking wheezing and short of breath after taking clonazepam. It sounds as if she just sleeps deeply. Ambien caused hallucination when she was taking 2 or 3 at a time. She says she was "given" her CPAP machine at the hospital but with no followup instructions and no explanation as to how to use it.  NPSG 03/31/07- AHI 5.8/ hr, weight 330 lbs. She canceled planned more recent study.  08/02/13- 89 yoF former smoker followed for chronic severe asthma/ Xolair complicated by OSA/ CPAP,  morbid obesity, HBP, depression, GERD ACUTE VISIT: had Xolair today; having increased nasal congestion, wheezing and SOB,headaches-using nebulizers more than usual. Just ran out of Dulera. Visit on prednisone 20 mg daily ear he taking omeprazole only when needed. Aware of reflux at night. Plain spring pollen for increased nasal congestion. 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 CXR 3/21/5 IMPRESSION:  Mild chronic bronchitic changes.  No acute abnormalities.  Electronically Signed  By: Lavonia Dana M.D.  On: 07/17/2013 19:02  12/30/13- 75 yoF former  smoker followed for chronic severe asthma/ Xolair complicated by OSA/ CPAP,  morbid obesity, HBP, depression, GERD FOLLOWS FOR: Pt states she has good and bad days. Pt states she has increase SOB in morning when waking up. Pt c/o mild dry cough. Pt denies CP/tightness.  Has Dulera 100, not quite enough help. No rescue inhaler.Has nebulizer. Wants to avoid ER, but frequent trips. Wants to avoid prednisone- bursts drive weight up. None in 2 days. Discussed low maintenance to stabilize. Off Xolair since divorce stopped her health insurance. Assistance program being explored.  Works third shift.. Significant difficulty initiating and maintaining sleep. OTC diphenhydramine not helpful. CXR 11/23/13 IMPRESSION:  No active cardiopulmonary disease.  Electronically Signed  By: Dereck Ligas M.D.  On: 11/23/2013 12:06   Review of Systems-See HPI Constitutional:   No-   weight loss, night sweats, fevers, chills, +fatigue, lassitude. HEENT:   No-  headaches, difficulty swallowing, tooth/dental problems, sore throat,       + sneezing, itching, ear ache, +nasal congestion, post nasal drip,  CV:  No-   chest pain, orthopnea, PND, swelling in lower extremities, anasarca, dizziness, palpitations Resp: +shortness of breath with exertion or at rest.              No-   productive cough,  + non-productive cough,  No- coughing up of blood.  No-   change in color of mucus. +wheezing.   Skin: No-   rash or lesions. GI:  No-   heartburn, indigestion, abdominal pain, nausea, vomiting,  GU: MS:  No-   joint pain or swelling.   Neuro-     nothing unusual Psych:  No- change in mood or affect. No depression or anxiety.  No memory loss.    Objective:   Physical Exam General- Alert, Oriented, Affect-appropriate, Distress- none acute, + morbid obesity,  Skin- rash-none, lesions- none, excoriation- none Lymphadenopathy- none Head- atraumatic            Eyes- Gross vision intact, PERRLA, conjunctivae clear  secretions            Ears- Hearing, canals-normal            Nose- Clear, no-Septal dev, mucus, polyps, erosion, perforation             Throat- Mallampati II , mucosa clear , drainage- none, tonsils- atrophic, + hoarse, no                      thrush Neck- flexible , trachea midline, no stridor , thyroid nl, carotid no bruit Chest - symmetrical excursion , unlabored           Heart/CV- RRR , no murmur , no gallop  , no rub, nl s1 s2                           - JVD- none , edema- none, stasis changes- none, varices- none           Lung- Diminished/ clear now, unlabored.  Wheeze-none, cough-none, dullness-none,                    rub- none           Chest wall-  Abd-  Br/ Gen/ Rectal- Not done, not indicated Extrem- cyanosis- none, clubbing, none, atrophy- none, strength- nl Neuro- grossly intact to observation

## 2013-12-31 NOTE — Assessment & Plan Note (Signed)
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 Plan- refilled ambien with discussion

## 2013-12-31 NOTE — Patient Instructions (Addendum)
Script prednisone 10 mg, 1 daily maintenance to prevent asthma flares  Script ambien 5 mg for insomnia  Work with Joellen Jersey to get your Xolair restarted  Use your nebulizer machine as your rescue treatment, up to 4 times daily, when needed   Samples and script Dulera 200, 2 puffs, then rinse mouth, twice daily- maintenance inhaler  Flu vax

## 2013-12-31 NOTE — Assessment & Plan Note (Addendum)
Biggest problem has been medication non-compliance/ finances. Seems now to be staying off cigarettes. Need to build plans that keep her out of ER. Discussed Plan- increase Dulera to 200. Maintenance prednisone for now at 10 mg daily, anticipating lower total dose than with repeated bursts. Resume Xolair when able.

## 2014-01-07 IMAGING — CR DG CHEST 2V
2 series · 2 of 2 positions shown · non-contrast
Comparison: 11/09/2012; 11/03/2012; 10/21/2012

CLINICAL DATA: Chest pain, abdominal pain, history of asthma

CHEST - 2 VIEW

[w chest pa]
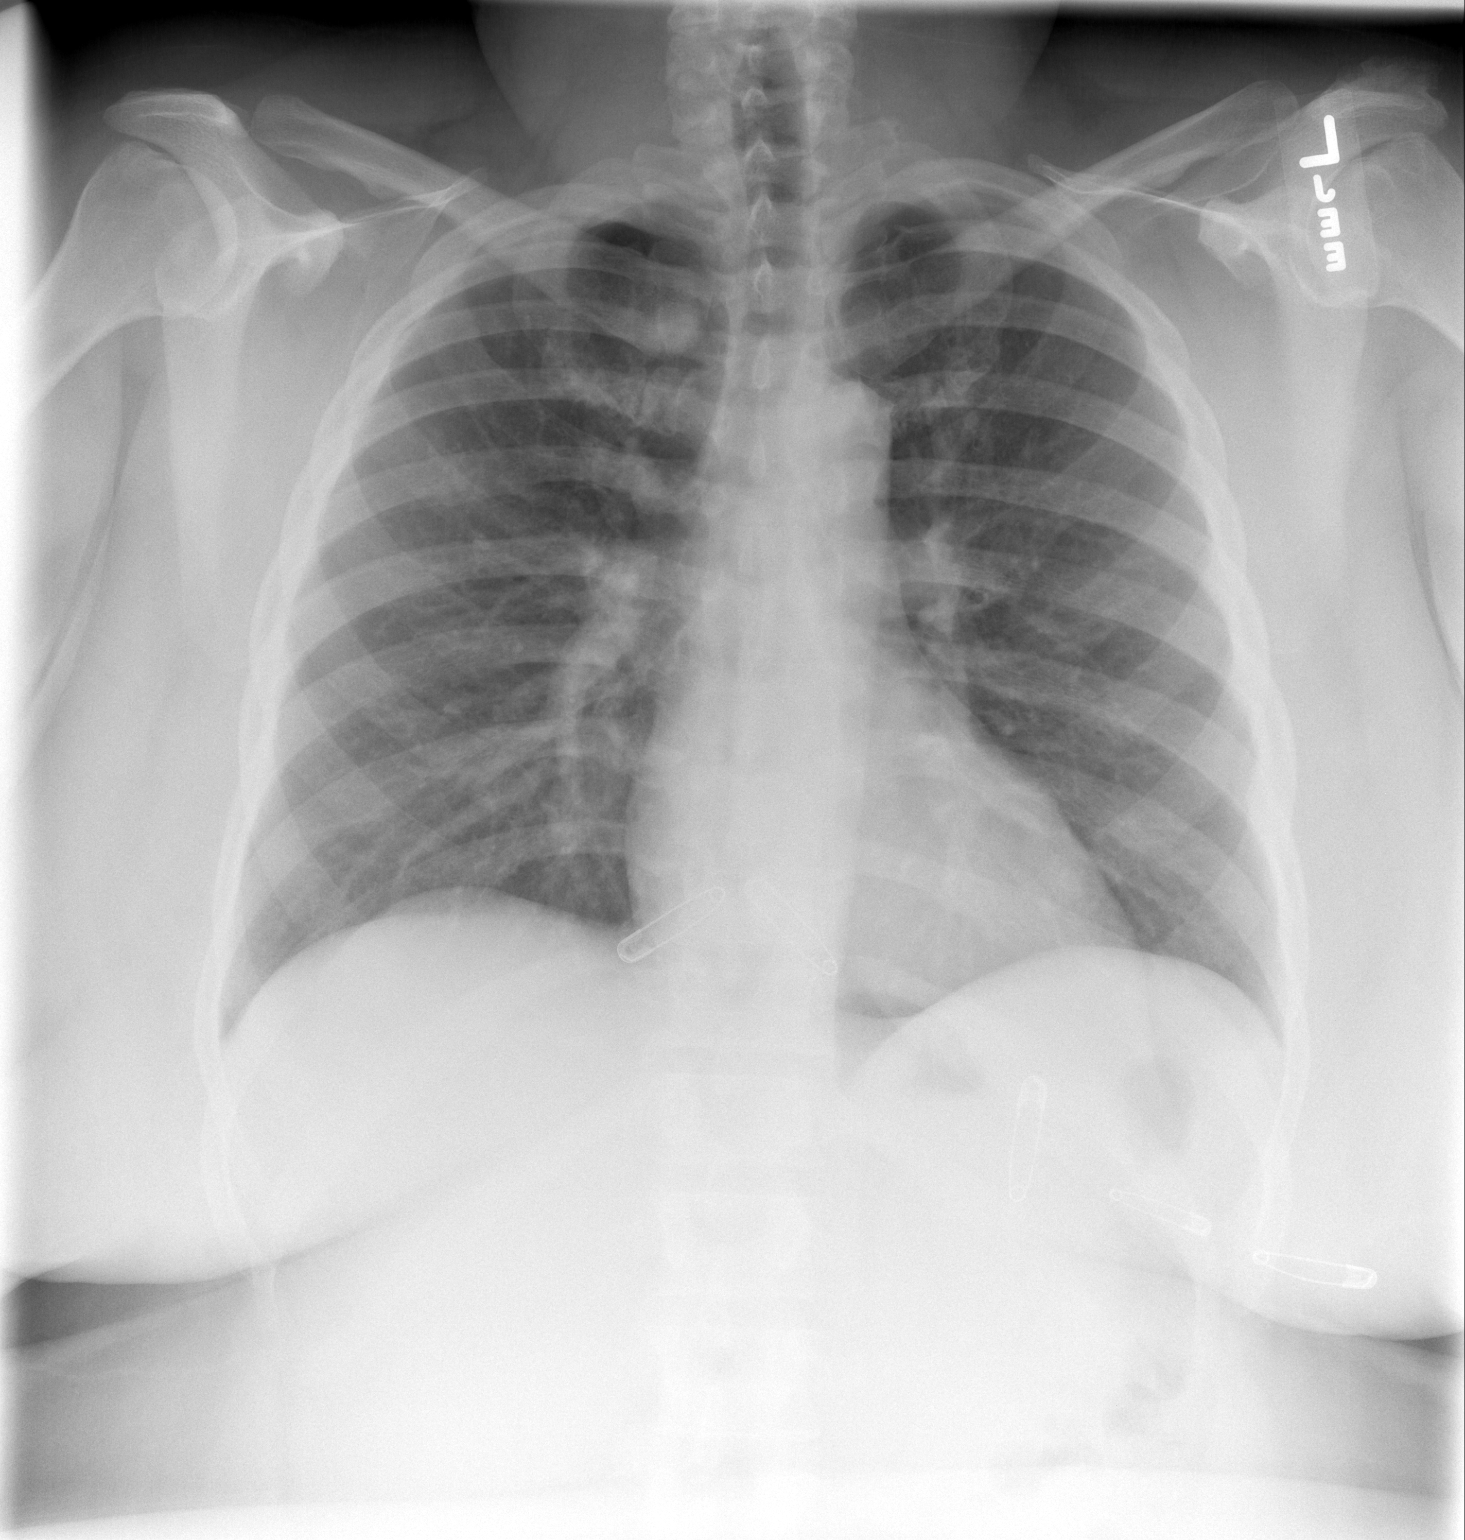

[w chest lat]
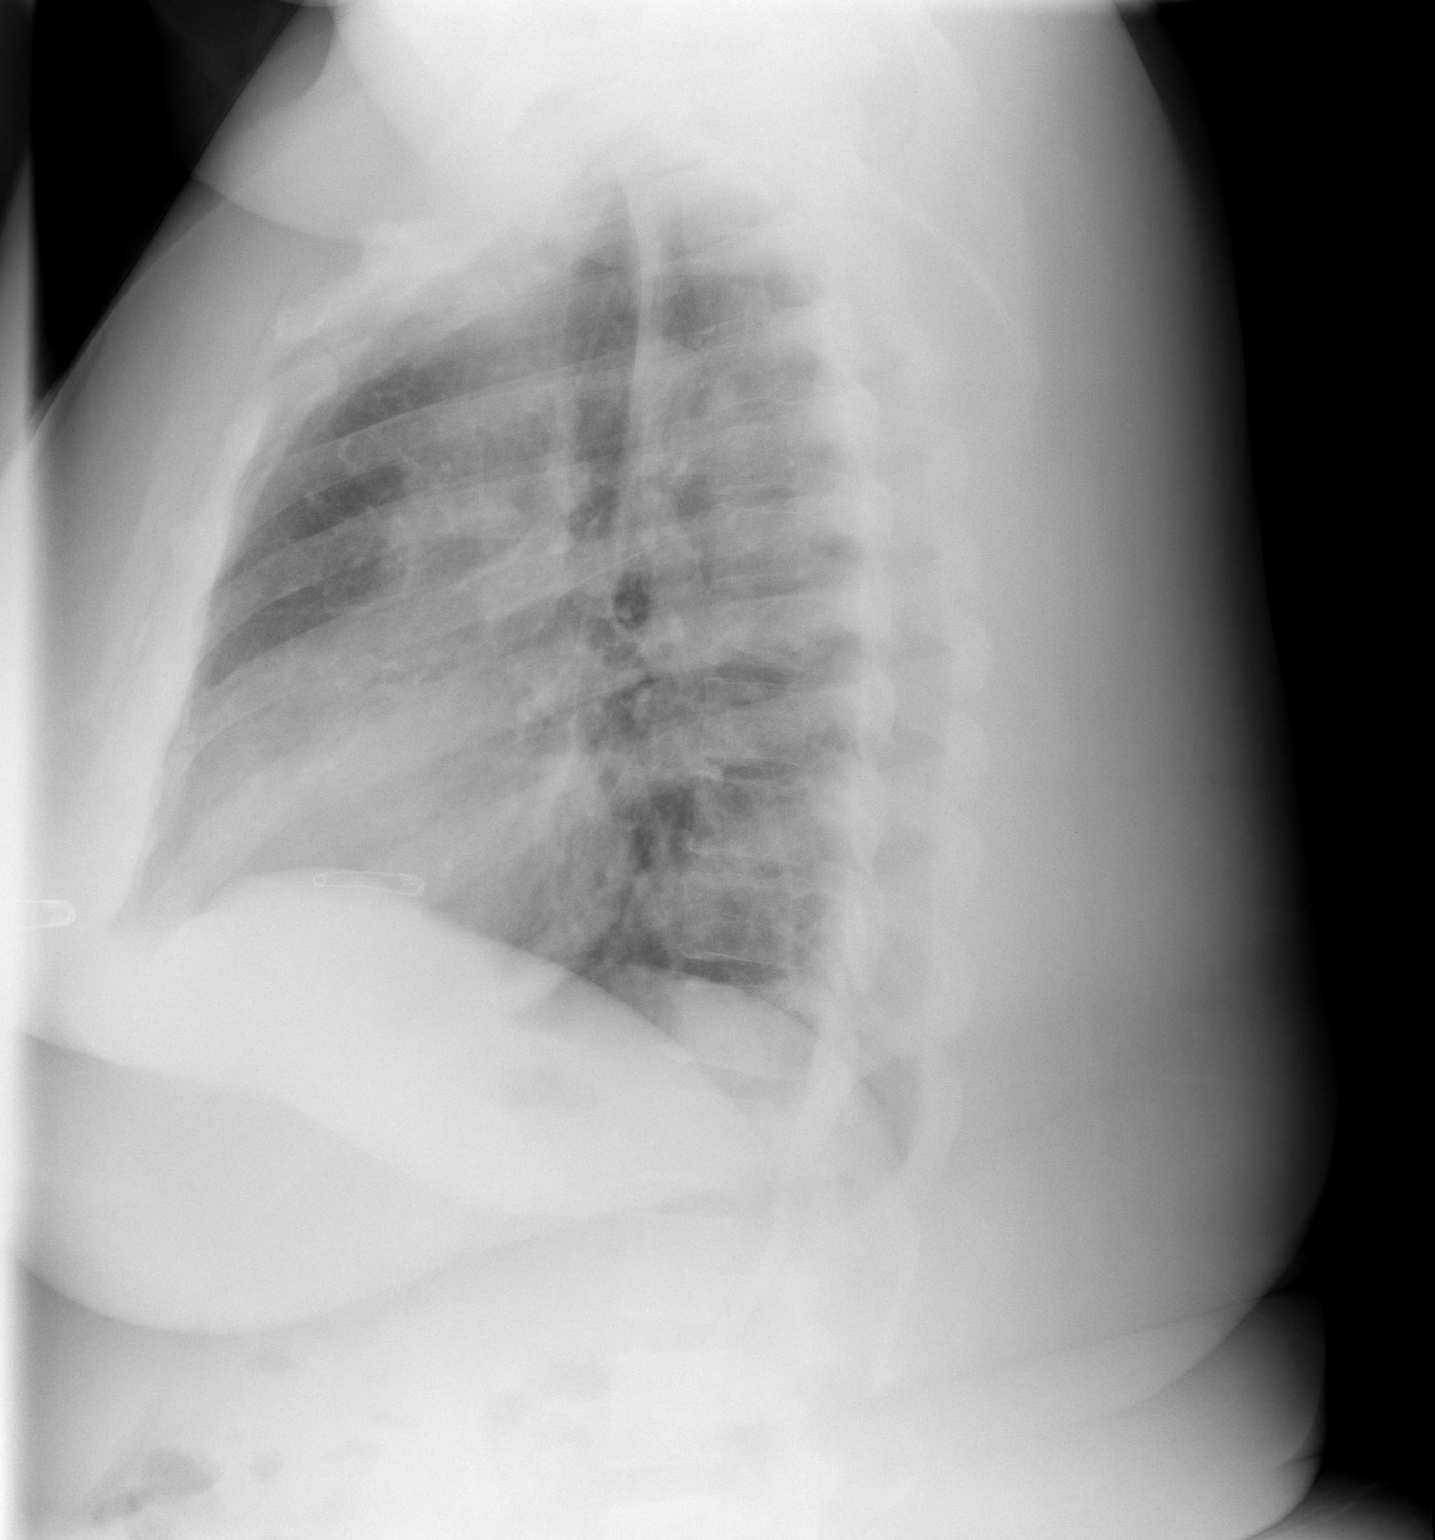

[2 of 2 positions shown; findings below may reference images not displayed]

FINDINGS: Grossly unchanged cardiac silhouette and mediastinal
contours.  Overall improved aeration of the lungs.  No focal
airspace opacities.  No pleural effusion or pneumothorax.  No
definite evidence of edema.  Grossly unchanged bones.
IMPRESSION: No acute cardiopulmonary disease.  Specifically, no evidence of
pneumonia.

## 2014-01-11 ENCOUNTER — Telehealth: Payer: Self-pay | Admitting: Internal Medicine

## 2014-01-12 NOTE — Telephone Encounter (Signed)
April [name], please advise status of forms thanks!

## 2014-01-14 NOTE — Telephone Encounter (Signed)
Discussed with Joellen Jersey, will forward to her for follow up. Thanks [name]!

## 2014-01-15 IMAGING — CR DG HAND COMPLETE 3+V*L*
3 series · 3 of 3 positions shown · non-contrast
Comparison: None.

CLINICAL DATA: Left hand pain following injury

LEFT HAND - COMPLETE 3+ VIEW

[view not recorded (1 of 3)]
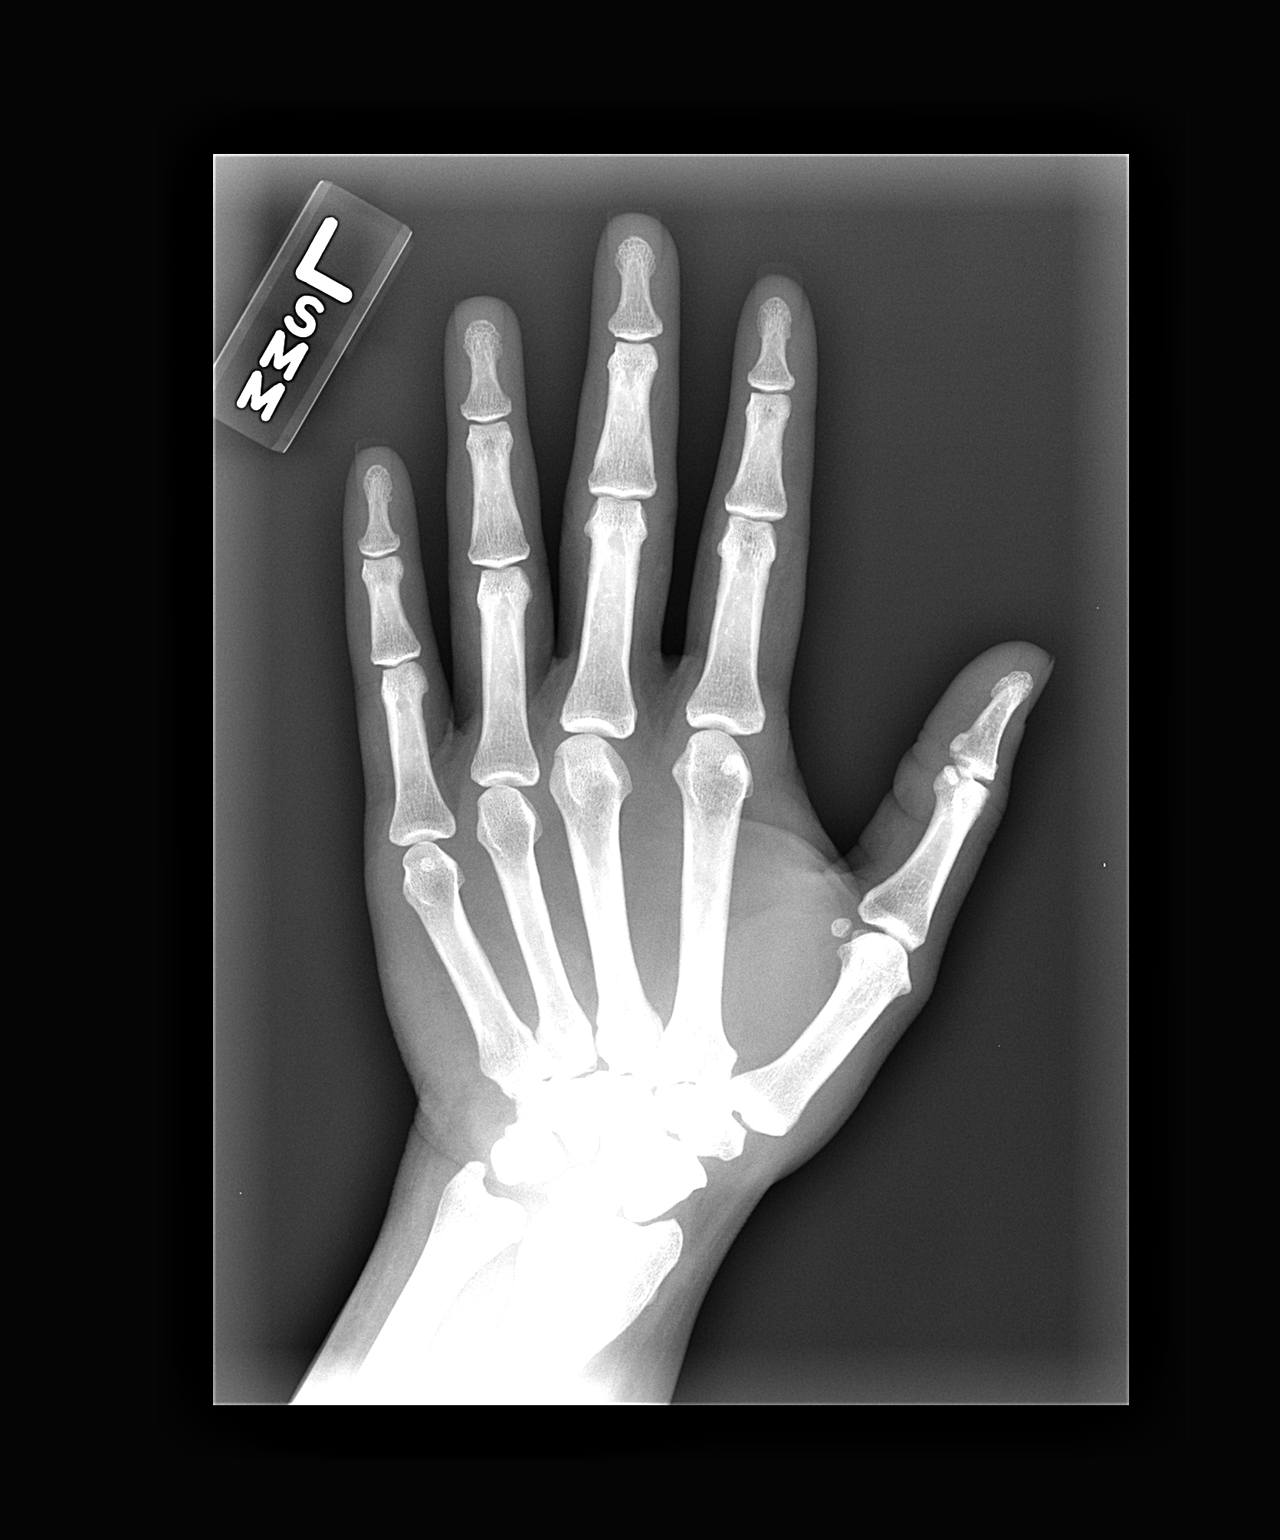

[view not recorded (2 of 3)]
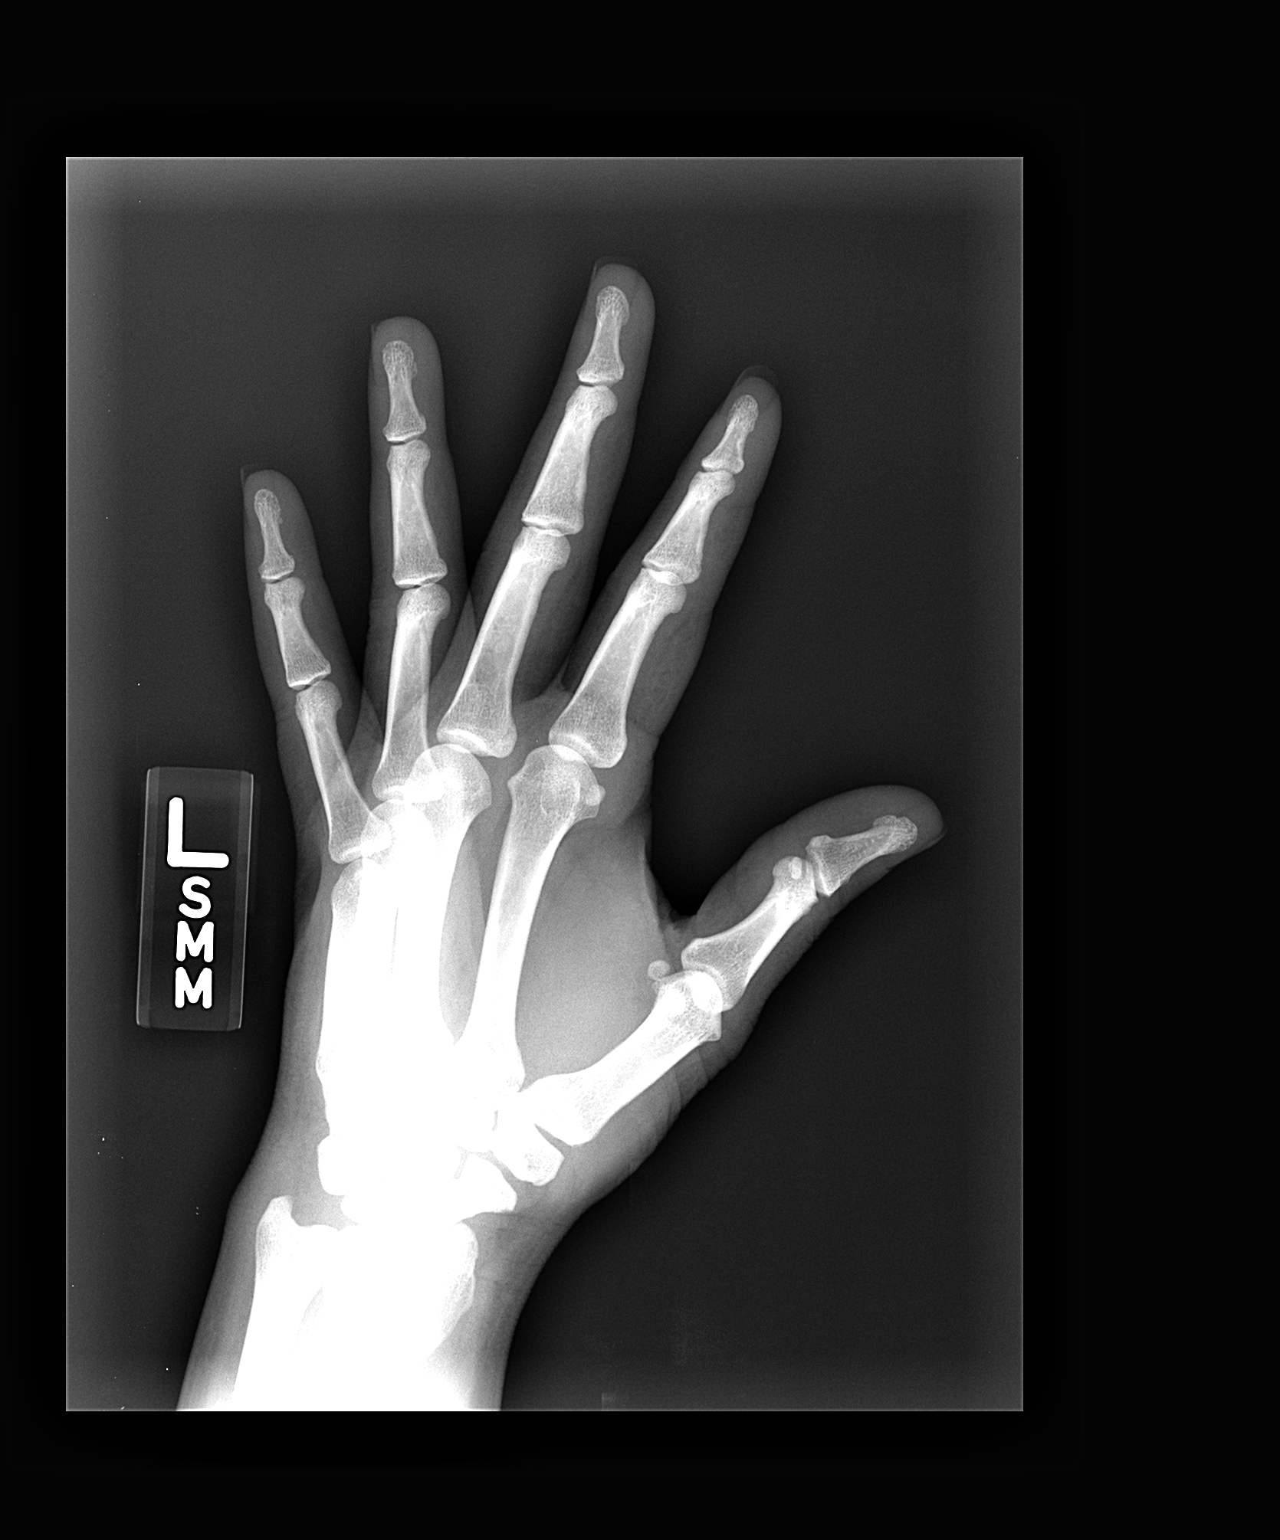

[view not recorded (3 of 3)]
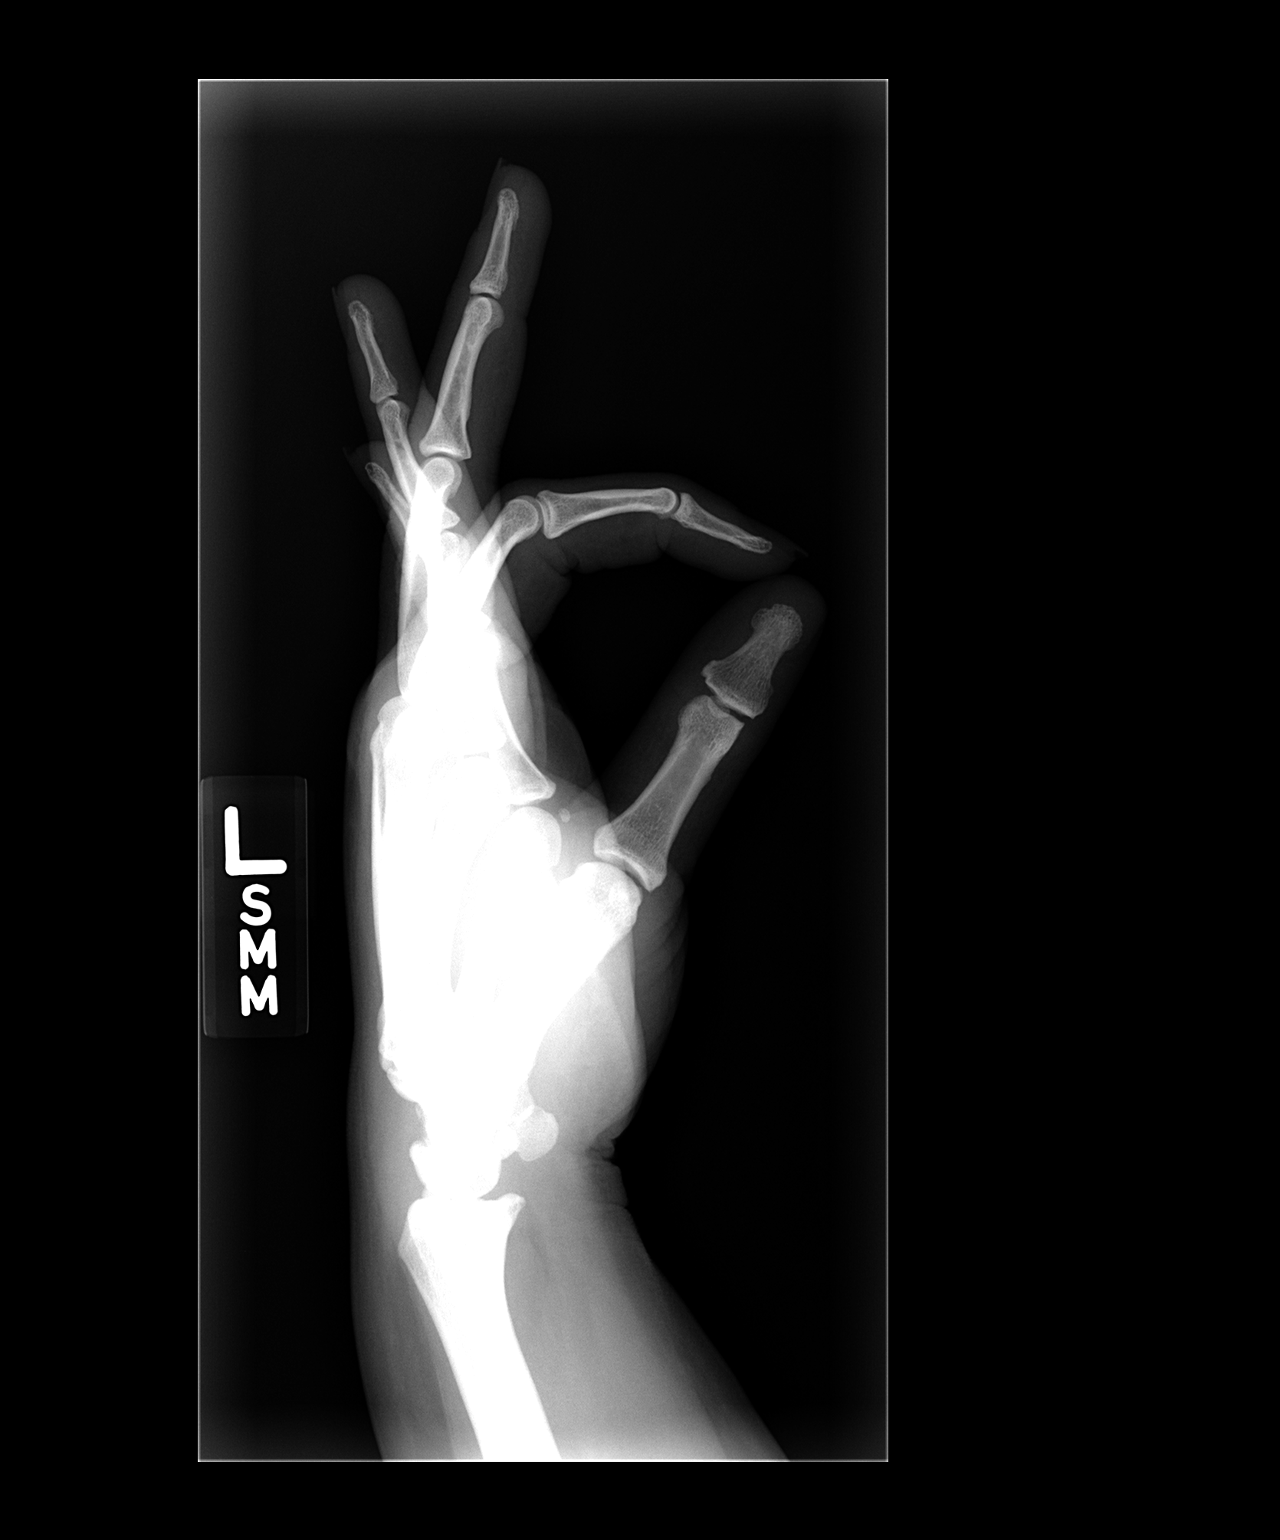

[3 of 3 positions shown; findings below may reference images not displayed]

FINDINGS: No acute fracture or dislocation is noted.  No gross soft
tissue abnormality is seen.
IMPRESSION: No acute abnormality noted.

## 2014-01-17 ENCOUNTER — Emergency Department (HOSPITAL_COMMUNITY)
Admission: EM | Admit: 2014-01-17 | Discharge: 2014-01-17 | Disposition: A | Discharge status: Home / Self Care | Payer: Self-pay | Attending: Emergency Medicine | Admitting: Emergency Medicine

## 2014-01-17 ENCOUNTER — Encounter (HOSPITAL_COMMUNITY): Payer: Self-pay | Admitting: Emergency Medicine

## 2014-01-17 DIAGNOSIS — I10 Essential (primary) hypertension: Secondary | ICD-10-CM | POA: Insufficient documentation

## 2014-01-17 DIAGNOSIS — Z8619 Personal history of other infectious and parasitic diseases: Secondary | ICD-10-CM | POA: Insufficient documentation

## 2014-01-17 DIAGNOSIS — F329 Major depressive disorder, single episode, unspecified: Secondary | ICD-10-CM | POA: Insufficient documentation

## 2014-01-17 DIAGNOSIS — Z87891 Personal history of nicotine dependence: Secondary | ICD-10-CM | POA: Insufficient documentation

## 2014-01-17 DIAGNOSIS — M549 Dorsalgia, unspecified: Secondary | ICD-10-CM | POA: Insufficient documentation

## 2014-01-17 DIAGNOSIS — Z872 Personal history of diseases of the skin and subcutaneous tissue: Secondary | ICD-10-CM | POA: Insufficient documentation

## 2014-01-17 DIAGNOSIS — J449 Chronic obstructive pulmonary disease, unspecified: Secondary | ICD-10-CM | POA: Insufficient documentation

## 2014-01-17 DIAGNOSIS — J4489 Other specified chronic obstructive pulmonary disease: Secondary | ICD-10-CM | POA: Insufficient documentation

## 2014-01-17 DIAGNOSIS — Z79899 Other long term (current) drug therapy: Secondary | ICD-10-CM | POA: Insufficient documentation

## 2014-01-17 DIAGNOSIS — Z8742 Personal history of other diseases of the female genital tract: Secondary | ICD-10-CM | POA: Insufficient documentation

## 2014-01-17 DIAGNOSIS — IMO0002 Reserved for concepts with insufficient information to code with codable children: Secondary | ICD-10-CM | POA: Insufficient documentation

## 2014-01-17 DIAGNOSIS — G4700 Insomnia, unspecified: Secondary | ICD-10-CM | POA: Insufficient documentation

## 2014-01-17 DIAGNOSIS — G8929 Other chronic pain: Secondary | ICD-10-CM | POA: Insufficient documentation

## 2014-01-17 DIAGNOSIS — M546 Pain in thoracic spine: Secondary | ICD-10-CM | POA: Insufficient documentation

## 2014-01-17 DIAGNOSIS — F3289 Other specified depressive episodes: Secondary | ICD-10-CM | POA: Insufficient documentation

## 2014-01-17 DIAGNOSIS — K219 Gastro-esophageal reflux disease without esophagitis: Secondary | ICD-10-CM | POA: Insufficient documentation

## 2014-01-17 MED ORDER — METHOCARBAMOL 500 MG PO TABS: 1000.0000 mg | ORAL_TABLET | Freq: Two times a day (BID) | ORAL | Status: DC

## 2014-01-17 MED ORDER — HYDROCODONE-ACETAMINOPHEN 5-325 MG PO TABS
2.0000 | ORAL_TABLET | Freq: Once | ORAL | Status: AC
  Administered 2014-01-17: 2 via ORAL
  Filled 2014-01-17: qty 2

## 2014-01-17 MED ORDER — METHOCARBAMOL 500 MG PO TABS
1000.0000 mg | ORAL_TABLET | Freq: Once | ORAL | Status: AC
  Administered 2014-01-17: 1000 mg via ORAL
  Filled 2014-01-17: qty 2

## 2014-01-17 MED ORDER — HYDROCODONE-ACETAMINOPHEN 5-325 MG PO TABS: 2.0000 | ORAL_TABLET | ORAL | Status: DC | PRN

## 2014-01-17 NOTE — Discharge Instructions (Signed)
Please follow directions provided.  Be sure to follow up with your primary care provider and the sports medicine office to help with this pain  SEEK IMMEDIATE MEDICAL CARE IF:  You have pain that radiates from your back into your legs.  You develop new bowel or bladder control problems.  You have unusual weakness or numbness in your arms or legs.  You develop nausea or vomiting.  You develop abdominal pain.  You feel faint.

## 2014-01-17 NOTE — ED Provider Notes (Signed)
CSN: [id number]     Arrival date & time 01/17/14  1151 History   None  This chart was scribed for non-physician practitioner Britt Bottom, NP, working with Orlie Dakin, MD by Rosary Lively, ED scribe. This patient was seen in room WTR7/WTR7 and the patient's care was started at 12:44 PM.    Chief Complaint  Patient presents with  . Back Pain   The history is provided by the patient. No language interpreter was used.   HPI Comments:  April [name] is a 43 y.o. female who presents to the Emergency Department complaining of chronic thoracic back pain that radiates to the neck, that worsened on last night. 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 Pt reports that she has experienced similar back problems, and had breast reduction surgery approximately 2 years ago. Pt reports that she was seeing sports medicine doctor for pain at that time.  Pt reports that she does not have insurance. Pt states that she is not allergic to percocet, but takes a benadryl with it. Pt denies that pain radiates to the legs. Pt denies incontinence of urine or bowel. Pt denies saddle anesthesia. Pt denies nausea, vomiting, or diarrhea. Pt denies fever or chills. Pt reports that she does have an appointment scheduled for care on 02/18/2014. Pt reports that she does have a PCP.   Past Medical History  Diagnosis Date  . Acanthosis nigricans   . Menorrhagia   . Hypertension, essential   . Insomnia   . Morbid obesity   . Helicobacter pylori (H. pylori) infection   . Sleep apnea     Sleep study 2008 - mild OSA, not enough events to titrate CPAP  . COPD (chronic obstructive pulmonary disease)     PFTs in 2002, FEV1/FVC 65, no post bronchodilater test done  . Asthma     Followed by Dr. Annamaria Boots (pulmonology); receives every other week omalizumab injections; has frequent exacerbations  . Depression   . GERD (gastroesophageal reflux disease)   . Tobacco user   .  Obesity   . Shortness of breath   . BPZWCHEN(277.8)    Past Surgical History  Procedure Laterality Date  . Tubal ligation  1996    bilateral  . Breast reduction surgery  09/2011   Family History  Problem Relation Age of Onset  . Asthma Daughter   . Cancer Paternal Aunt   . Asthma Maternal Grandmother   . Hypertension Mother    History  Substance Use Topics  . Smoking status: Former Smoker -- 0.25 packs/day for 18 years    Types: Cigarettes    Quit date: 10/04/2012  . Smokeless tobacco: Never Used  . Alcohol Use: No   OB History   Grav Para Term Preterm Abortions TAB SAB Ect Mult Living                 Review of Systems  Constitutional: Negative for fever and chills.  Musculoskeletal: Positive for back pain.   Allergies  Ace inhibitors and Percocet  Home Medications   Prior to Admission medications   Medication Sig Start Date End Date Taking? Authorizing Provider  albuterol (PROAIR HFA) 108 (90 BASE) MCG/ACT inhaler Inhale 2 puffs into the lungs every 4 (four) hours as needed for wheezing or shortness of breath. 09/15/13   Axel Filler, MD  albuterol (PROVENTIL) (2.5 MG/3ML) 0.083% nebulizer solution Take 3 mLs (2.5 mg total) by nebulization every 4 (four)  hours as needed for wheezing or shortness of breath. 06/09/13   Lesly Dukes, MD  chlorpheniramine (CHLOR-TRIMETON) 4 MG tablet Take 1 tablet (4 mg total) by mouth 2 (two) times daily as needed for allergies. 07/21/13   Valaria Good, MD  cyclobenzaprine (FLEXERIL) 10 MG tablet Take 1 tablet (10 mg total) by mouth 2 (two) times daily as needed for muscle spasms. 12/25/13   Shari A Upstill, PA-C  hydrochlorothiazide (MICROZIDE) 12.5 MG capsule Take 1 capsule (12.5 mg total) by mouth daily. 11/04/13 11/04/14  Axel Filler, MD  HYDROcodone-acetaminophen ([place]) 7.5-325 MG per tablet Take 1 tablet by mouth every 4 (four) hours as needed. 12/13/13   Janne Napoleon, NP  ibuprofen (MOTRIN IB) 200 MG tablet Take 4 tablets (800  mg total) by mouth every 8 (eight) hours as needed for moderate pain. 12/08/13   Albin Felling, MD  mometasone-formoterol (DULERA) 200-5 MCG/ACT AERO 2 puffs then rinse mouth, twice daily 12/31/13   Deneise Lever, MD  omalizumab Arvid Right) 150 MG injection Inject 225 mg into the skin every 14 (fourteen) days. Had inj last 06-18-12    Historical Provider, MD  omeprazole (PRILOSEC) 20 MG capsule Take 20 mg by mouth 2 (two) times daily before a meal.    Historical Provider, MD  oxyCODONE-acetaminophen (PERCOCET/ROXICET) 5-325 MG per tablet Take 1-2 tablets by mouth every 4 (four) hours as needed for severe pain. 12/25/13   Shari A Upstill, PA-C  predniSONE (DELTASONE) 10 MG tablet Take 1 tablet (10 mg total) by mouth daily with breakfast. 12/31/13   Deneise Lever, MD  zolpidem (AMBIEN) 5 MG tablet Take 1 tablet (5 mg total) by mouth at bedtime as needed for sleep. 12/31/13   Deneise Lever, MD   BP 155/93  Pulse 84  Temp(Src) 98.1 F (36.7 C) (Oral)  Resp 18  SpO2 97%  LMP 01/13/2014 Physical Exam  Nursing note and vitals reviewed. Constitutional: She is oriented to person, place, and time. She appears well-developed and well-nourished.  HENT:  Head: Normocephalic and atraumatic.  Eyes: EOM are normal.  Neck: Normal range of motion. Neck supple.  Cardiovascular: Normal rate.   Pulmonary/Chest: Effort normal.  Musculoskeletal: Normal range of motion.       Thoracic back: She exhibits tenderness. She exhibits no bony tenderness.  Tenderness of the bilateral paraspinal muscles of the thoracic region. No midline or bony tenderness. Crepitus of the left knee.   Neurological: She is alert and oriented to person, place, and time.  Skin: Skin is warm and dry.  Psychiatric: She has a normal mood and affect. Her behavior is normal.    ED Course  Procedures  DIAGNOSTIC STUDIES: Oxygen Saturation is 97% on RA, adequate by my interpretation.  COORDINATION OF CARE: 12:52 PM-Discussed treatment plan  which includes muscle relaxant and pain meds with pt at bedside and pt agreed to plan.  Labs Review Labs Reviewed - No data to display  Imaging Review No results found.   EKG Interpretation None      MDM   Final diagnoses:  Bilateral thoracic back pain   43 yo female with recurrent back pain without new injury. There are no neurological deficits and she has a normal neuro exam.  She denies loss of bowel or bladder control  fever, night sweats, weight loss, h/o cancer, IVDU. Doubt cauda equina.  Pain managed in the ED.  Discharge instructions include symptomatic management of back pain, prescription for muscle relaxant and pain meds and encouragement  to follow-up with pt's back specialist. Pt verbalizes understanding and in agreement.  Return precautions provided.  I personally performed the services described in this documentation, which was scribed in my presence. The recorded information has been reviewed and is accurate.  Filed Vitals:   01/17/14 1217 01/17/14 1305  BP: 155/93 139/81  Pulse: 84 81  Temp: 98.1 F (36.7 C)   TempSrc: Oral   Resp: 18 18  SpO2: 97% 98%   Meds given in ED:  Medications  HYDROcodone-acetaminophen (NORCO/VICODIN) 5-325 MG per tablet 2 tablet (2 tablets Oral Given 01/17/14 1303)  methocarbamol (ROBAXIN) tablet 1,000 mg (1,000 mg Oral Given 01/17/14 1303)    Discharge Medication List as of 01/17/2014  1:00 PM    START taking these medications   Details  HYDROcodone-acetaminophen (NORCO/VICODIN) 5-325 MG per tablet Take 2 tablets by mouth every 4 (four) hours as needed for moderate pain or severe pain., Starting 01/17/2014, Until Discontinued, Print    methocarbamol (ROBAXIN) 500 MG tablet Take 2 tablets (1,000 mg total) by mouth 2 (two) times daily., Starting 01/17/2014, Until Discontinued, Print            Britt Bottom, NP 01/19/14 [phone]

## 2014-01-17 NOTE — Telephone Encounter (Signed)
Pt called to inquire about xolair assistance forms.  [name], please advise on status.  Thank you!

## 2014-01-17 NOTE — Progress Notes (Signed)
[place],  Did not get to see patient but will be sending information on [facility] program to help patient establish primary care, using the address provided.

## 2014-01-17 NOTE — ED Notes (Signed)
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 Denies any urinary changes or burning with urination.

## 2014-01-19 NOTE — Telephone Encounter (Signed)
Pt would like an update asap & can be reached at [phone].  April [name]

## 2014-01-20 NOTE — ED Provider Notes (Signed)
Medical screening examination/treatment/procedure(s) were performed by non-physician practitioner and as supervising physician I was immediately available for consultation/collaboration.   EKG Interpretation None       Orlie Dakin, MD 01/20/14 1147

## 2014-01-20 NOTE — Telephone Encounter (Signed)
I spoke with patient yesterday- she is aware that we are taking care of her Xolair patient assistance and her [employer] patient assistance for [facility] and Proventil(mailed out yesterday and can take up to 2 weeks for an answer). Pt was given samples to help in the meantime.

## 2014-01-24 ENCOUNTER — Ambulatory Visit: Payer: Self-pay | Admitting: Internal Medicine

## 2014-01-27 ENCOUNTER — Telehealth: Payer: Self-pay | Admitting: Internal Medicine

## 2014-01-27 ENCOUNTER — Encounter (HOSPITAL_COMMUNITY): Payer: Self-pay | Admitting: Emergency Medicine

## 2014-01-27 ENCOUNTER — Other Ambulatory Visit: Payer: Self-pay | Admitting: Internal Medicine

## 2014-01-27 ENCOUNTER — Emergency Department (INDEPENDENT_AMBULATORY_CARE_PROVIDER_SITE_OTHER)
Admission: EM | Admit: 2014-01-27 | Discharge: 2014-01-27 | Disposition: A | Discharge status: Home / Self Care | Payer: Self-pay | Source: Home / Self Care | Attending: Family Medicine | Admitting: Family Medicine

## 2014-01-27 DIAGNOSIS — J45901 Unspecified asthma with (acute) exacerbation: Secondary | ICD-10-CM

## 2014-01-27 DIAGNOSIS — G8929 Other chronic pain: Secondary | ICD-10-CM

## 2014-01-27 DIAGNOSIS — M549 Dorsalgia, unspecified: Secondary | ICD-10-CM

## 2014-01-27 DIAGNOSIS — M546 Pain in thoracic spine: Secondary | ICD-10-CM

## 2014-01-27 MED ORDER — ALBUTEROL SULFATE (2.5 MG/3ML) 0.083% IN NEBU
5.0000 mg | INHALATION_SOLUTION | Freq: Once | RESPIRATORY_TRACT | Status: AC
  Administered 2014-01-27: 5 mg via RESPIRATORY_TRACT

## 2014-01-27 MED ORDER — IPRATROPIUM BROMIDE 0.02 % IN SOLN
0.5000 mg | Freq: Once | RESPIRATORY_TRACT | Status: AC
  Administered 2014-01-27: 0.5 mg via RESPIRATORY_TRACT

## 2014-01-27 MED ORDER — METHYLPREDNISOLONE SODIUM SUCC 125 MG IJ SOLR
INTRAMUSCULAR | Status: AC
  Filled 2014-01-27: qty 2

## 2014-01-27 MED ORDER — CYCLOBENZAPRINE HCL 5 MG PO TABS: 5.0000 mg | ORAL_TABLET | Freq: Three times a day (TID) | ORAL | Status: DC | PRN

## 2014-01-27 MED ORDER — METHYLPREDNISOLONE SODIUM SUCC 125 MG IJ SOLR
125.0000 mg | Freq: Once | INTRAMUSCULAR | Status: AC
  Administered 2014-01-27: 125 mg via INTRAMUSCULAR

## 2014-01-27 MED ORDER — ALBUTEROL SULFATE (2.5 MG/3ML) 0.083% IN NEBU
INHALATION_SOLUTION | RESPIRATORY_TRACT | Status: AC
  Filled 2014-01-27: qty 6

## 2014-01-27 MED ORDER — PREDNISONE 10 MG PO TABS: 10.0000 mg | ORAL_TABLET | Freq: Every day | ORAL | Status: DC

## 2014-01-27 MED ORDER — METHYLPREDNISOLONE 4 MG PO KIT: PACK | ORAL | Status: DC

## 2014-01-27 MED ORDER — ALBUTEROL SULFATE (2.5 MG/3ML) 0.083% IN NEBU
INHALATION_SOLUTION | RESPIRATORY_TRACT | Status: AC
  Filled 2014-01-27: qty 3

## 2014-01-27 MED ORDER — IPRATROPIUM BROMIDE 0.02 % IN SOLN
RESPIRATORY_TRACT | Status: AC
  Filled 2014-01-27: qty 2.5

## 2014-01-27 NOTE — ED Notes (Signed)
C/o  Sob.  States nebulizer treatments not working at home.  Also c/o upper back pain. 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

## 2014-01-27 NOTE — Telephone Encounter (Signed)
Called and spoke with pt and she is aware that this has been sent to her pharmacy and nothing further is needed.

## 2014-01-27 NOTE — Telephone Encounter (Signed)
Called and spoke with pt and she stated that she needs a refill of the prednisone 10 mg.  She is taking 1 daily.  She stated that the last time this was filled she was only given 20 tablets.  CY please advise if ok to refill and ok to give #30 tablets.   Thanks  Last ov--12/31/2013 Next ov--02/22/2014  Allergies  Allergen Reactions  . Ace Inhibitors Cough  . Percocet [Oxycodone-Acetaminophen] Itching    Ok with benadryl    Current Outpatient Prescriptions on File Prior to Visit  Medication Sig Dispense Refill  . albuterol (PROAIR HFA) 108 (90 BASE) MCG/ACT inhaler Inhale 2 puffs into the lungs every 4 (four) hours as needed for wheezing or shortness of breath.  8.5 g  2  . albuterol (PROVENTIL) (2.5 MG/3ML) 0.083% nebulizer solution Take 3 mLs (2.5 mg total) by nebulization every 4 (four) hours as needed for wheezing or shortness of breath.  30 vial  11  . chlorpheniramine (CHLOR-TRIMETON) 4 MG tablet Take 1 tablet (4 mg total) by mouth 2 (two) times daily as needed for allergies.  14 tablet  0  . cyclobenzaprine (FLEXERIL) 10 MG tablet Take 1 tablet (10 mg total) by mouth 2 (two) times daily as needed for muscle spasms.  20 tablet  0  . hydrochlorothiazide (MICROZIDE) 12.5 MG capsule Take 1 capsule (12.5 mg total) by mouth daily.  30 capsule  1  . HYDROcodone-acetaminophen (NORCO) 7.5-325 MG per tablet Take 1 tablet by mouth every 4 (four) hours as needed.  12 tablet  0  . HYDROcodone-acetaminophen (NORCO/VICODIN) 5-325 MG per tablet Take 2 tablets by mouth every 4 (four) hours as needed for moderate pain or severe pain.  6 tablet  0  . ibuprofen (MOTRIN IB) 200 MG tablet Take 4 tablets (800 mg total) by mouth every 8 (eight) hours as needed for moderate pain.  30 tablet  1  . methocarbamol (ROBAXIN) 500 MG tablet Take 2 tablets (1,000 mg total) by mouth 2 (two) times daily.  20 tablet  0  . mometasone-formoterol (DULERA) 200-5 MCG/ACT AERO 2 puffs then rinse mouth, twice daily  1 Inhaler  prn   . omalizumab (XOLAIR) 150 MG injection Inject 225 mg into the skin every 14 (fourteen) days. Had inj last 06-18-12      . omeprazole (PRILOSEC) 20 MG capsule Take 20 mg by mouth 2 (two) times daily before a meal.      . oxyCODONE-acetaminophen (PERCOCET/ROXICET) 5-325 MG per tablet Take 1-2 tablets by mouth every 4 (four) hours as needed for severe pain.  15 tablet  0  . predniSONE (DELTASONE) 10 MG tablet Take 1 tablet (10 mg total) by mouth daily with breakfast.  20 tablet  0  . zolpidem (AMBIEN) 5 MG tablet Take 1 tablet (5 mg total) by mouth at bedtime as needed for sleep.  30 tablet  3   No current facility-administered medications on file prior to visit.

## 2014-01-27 NOTE — ED Provider Notes (Signed)
CSN: [id number]     Arrival date & time 01/27/14  1705 History   First MD Initiated Contact with Patient 01/27/14 1747     Chief Complaint  Patient presents with  . Asthma   (Consider location/radiation/quality/duration/timing/severity/associated sxs/prior Treatment) Patient is a 43 y.o. female presenting with asthma. The history is provided by the patient.  Asthma This is a recurrent problem. The current episode started 6 to 12 hours ago (used home neb this am without relief.). The problem has not changed since onset.Associated symptoms include shortness of breath. Associated symptoms comments: Chronic back pain.    Past Medical History  Diagnosis Date  . Acanthosis nigricans   . Menorrhagia   . Hypertension, essential   . Insomnia   . Morbid obesity   . Helicobacter pylori (H. pylori) infection   . Sleep apnea     Sleep study 2008 - mild OSA, not enough events to titrate CPAP  . COPD (chronic obstructive pulmonary disease)     PFTs in 2002, FEV1/FVC 65, no post bronchodilater test done  . Asthma     Followed by Dr. Annamaria Boots (pulmonology); receives every other week omalizumab injections; has frequent exacerbations  . Depression   . GERD (gastroesophageal reflux disease)   . Tobacco user   . Obesity   . Shortness of breath   . HQIONGEX(528.4)    Past Surgical History  Procedure Laterality Date  . Tubal ligation  1996    bilateral  . Breast reduction surgery  09/2011   Family History  Problem Relation Age of Onset  . Asthma Daughter   . Cancer Paternal Aunt   . Asthma Maternal Grandmother   . Hypertension Mother    History  Substance Use Topics  . Smoking status: Former Smoker -- 0.25 packs/day for 18 years    Types: Cigarettes    Quit date: 10/04/2012  . Smokeless tobacco: Never Used  . Alcohol Use: No   OB History   Grav Para Term Preterm Abortions TAB SAB Ect Mult Living                 Review of Systems  Constitutional: Negative.   Respiratory: Positive  for shortness of breath and wheezing.   Musculoskeletal: Positive for back pain. Negative for gait problem.       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 and treated. Reports vicodin don't help.    Allergies  Ace inhibitors and Percocet  Home Medications   Prior to Admission medications   Medication Sig Start Date End Date Taking? Authorizing Provider  albuterol (PROAIR HFA) 108 (90 BASE) MCG/ACT inhaler Inhale 2 puffs into the lungs every 4 (four) hours as needed for wheezing or shortness of breath. 09/15/13  Yes Axel Filler, MD  albuterol (PROVENTIL) (2.5 MG/3ML) 0.083% nebulizer solution Take 3 mLs (2.5 mg total) by nebulization every 4 (four) hours as needed for wheezing or shortness of breath. 06/09/13  Yes Lesly Dukes, MD  hydrochlorothiazide (MICROZIDE) 12.5 MG capsule Take 1 capsule (12.5 mg total) by mouth daily. 11/04/13 11/04/14 Yes Axel Filler, MD  chlorpheniramine (CHLOR-TRIMETON) 4 MG tablet Take 1 tablet (4 mg total) by mouth 2 (two) times daily as needed for allergies. 07/21/13   Valaria Good, MD  cyclobenzaprine (FLEXERIL) 10 MG tablet Take 1 tablet (10 mg total) by mouth 2 (two) times daily as needed for muscle spasms. 12/25/13   Shari A Upstill, PA-C  cyclobenzaprine (FLEXERIL) 5 MG tablet Take  1 tablet (5 mg total) by mouth 3 (three) times daily as needed for muscle spasms. 01/27/14   Billy Fischer, MD  HYDROcodone-acetaminophen ([place]) 7.5-325 MG per tablet Take 1 tablet by mouth every 4 (four) hours as needed. 12/13/13   Janne Napoleon, NP  HYDROcodone-acetaminophen (NORCO/VICODIN) 5-325 MG per tablet Take 2 tablets by mouth every 4 (four) hours as needed for moderate pain or severe pain. 01/17/14   Britt Bottom, NP  ibuprofen (MOTRIN IB) 200 MG tablet Take 4 tablets (800 mg total) by mouth every 8 (eight) hours as needed for moderate pain. 12/08/13   Albin Felling, MD  methocarbamol (ROBAXIN) 500 MG tablet Take 2 tablets (1,000 mg total) by mouth  2 (two) times daily. 01/17/14   Britt Bottom, NP  methylPREDNISolone (MEDROL DOSEPAK) 4 MG tablet follow package directions, take all of medicine, start on Friday. 01/27/14   Billy Fischer, MD  mometasone-formoterol [facility]) 200-5 MCG/ACT AERO 2 puffs then rinse mouth, twice daily 12/31/13   Deneise Lever, MD  omalizumab Arvid Right) 150 MG injection Inject 225 mg into the skin every 14 (fourteen) days. Had inj last 06-18-12    Historical Provider, MD  omeprazole (PRILOSEC) 20 MG capsule Take 20 mg by mouth 2 (two) times daily before a meal.    Historical Provider, MD  oxyCODONE-acetaminophen (PERCOCET/ROXICET) 5-325 MG per tablet Take 1-2 tablets by mouth every 4 (four) hours as needed for severe pain. 12/25/13   Shari A Upstill, PA-C  predniSONE (DELTASONE) 10 MG tablet Take 1 tablet (10 mg total) by mouth daily with breakfast. 01/27/14   Deneise Lever, MD  zolpidem (AMBIEN) 5 MG tablet Take 1 tablet (5 mg total) by mouth at bedtime as needed for sleep. 12/31/13   Deneise Lever, MD   BP 152/82  Pulse 84  Temp(Src) 98.7 F (37.1 C) (Oral)  Resp 22  SpO2 99%  LMP 01/09/2014 Physical Exam  Nursing note and vitals reviewed. Constitutional: She is oriented to person, place, and time. She appears well-developed and well-nourished.  HENT:  Right Ear: External ear normal.  Left Ear: External ear normal.  Mouth/Throat: Oropharynx is clear and moist.  Neck: Normal range of motion. Neck supple.  Pulmonary/Chest: Effort normal. No respiratory distress. She has wheezes. She has no rales.  Musculoskeletal:  Chronic total back pain issues  Lymphadenopathy:    She has no cervical adenopathy.  Neurological: She is alert and oriented to person, place, and time.  Skin: Skin is warm and dry.    ED Course  Procedures (including critical care time) Labs Review Labs Reviewed - No data to display  Imaging Review No results found.   MDM   1. Asthma exacerbation, mild   2. Chronic upper back pain     Sx improved at time of d/c , lungs clear, peak flow was 650 on third atetmpt. Continues asking for meds for back pain.    Billy Fischer, MD 01/27/14 [phone]

## 2014-01-27 NOTE — Telephone Encounter (Signed)
Ok prednisone 10 mg, # 30, 1 daily, ref x 3

## 2014-01-27 NOTE — Telephone Encounter (Signed)
[name] please advise status of PA

## 2014-01-31 ENCOUNTER — Encounter: Payer: Self-pay | Admitting: Internal Medicine

## 2014-02-01 ENCOUNTER — Encounter: Payer: Self-pay | Admitting: Internal Medicine

## 2014-02-01 ENCOUNTER — Ambulatory Visit (INDEPENDENT_AMBULATORY_CARE_PROVIDER_SITE_OTHER): Payer: Self-pay | Admitting: Internal Medicine

## 2014-02-01 VITALS — BP 169/109 | HR 84 | Temp 98.5°F | Ht 66.0 in | Wt 328.9 lb

## 2014-02-01 DIAGNOSIS — M5090 Cervical disc disorder, unspecified, unspecified cervical region: Secondary | ICD-10-CM

## 2014-02-01 DIAGNOSIS — I10 Essential (primary) hypertension: Secondary | ICD-10-CM

## 2014-02-01 DIAGNOSIS — Z113 Encounter for screening for infections with a predominantly sexual mode of transmission: Secondary | ICD-10-CM

## 2014-02-01 DIAGNOSIS — J45901 Unspecified asthma with (acute) exacerbation: Secondary | ICD-10-CM

## 2014-02-01 DIAGNOSIS — M79671 Pain in right foot: Secondary | ICD-10-CM | POA: Insufficient documentation

## 2014-02-01 MED ORDER — OXYCODONE-ACETAMINOPHEN 10-325 MG PO TABS: 1.0000 | ORAL_TABLET | Freq: Two times a day (BID) | ORAL | Status: DC | PRN

## 2014-02-01 NOTE — Patient Instructions (Addendum)
General Instructions: Please follow up in 1-2 months  Take care  I will let your know about your labs   Treatment Goals:  Goals (1 Years of Data) as of 02/01/14         As of Today 01/27/14 01/17/14 01/17/14 12/31/13     Blood Pressure    . Blood Pressure < 140/90  169/109 152/82 139/81 155/93 144/92      Progress Toward Treatment Goals:  Treatment Goal 02/01/2014  Blood pressure deteriorated    Self Care Goals & Plans:  Self Care Goal 02/01/2014  Manage my medications take my medicines as prescribed; bring my medications to every visit  Monitor my health keep track of my blood pressure  Eat healthy foods eat more vegetables; eat foods that are low in salt; eat baked foods instead of fried foods; drink diet soda or water instead of juice or soda; eat fruit for snacks and desserts; eat smaller portions  Be physically active find an activity I enjoy; take a walk every day  Meeting treatment goals maintain the current self-care plan    No flowsheet data found.   Care Management & Community Referrals:  Referral 02/01/2014  Referrals made for care management support none needed  Referrals made to community resources none       Back Pain, Adult Low back pain is very common. About 1 in 5 people have back pain.The cause of low back pain is rarely dangerous. The pain often gets better over time.About half of people with a sudden onset of back pain feel better in just 2 weeks. About 8 in 10 people feel better by 6 weeks.  CAUSES Some common causes of back pain include:  Strain of the muscles or ligaments supporting the spine.  Wear and tear (degeneration) of the spinal discs.  Arthritis.  Direct injury to the back. DIAGNOSIS Most of the time, the direct cause of low back pain is not known.However, back pain can be treated effectively even when the exact cause of the pain is unknown.Answering your caregiver's questions about your overall health and symptoms is one of the most  accurate ways to make sure the cause of your pain is not dangerous. If your caregiver needs more information, he or she may order lab work or imaging tests (X-rays or MRIs).However, even if imaging tests show changes in your back, this usually does not require surgery. HOME CARE INSTRUCTIONS For many people, back pain returns.Since low back pain is rarely dangerous, it is often a condition that people can learn to [facility] their own.   Remain active. It is stressful on the back to sit or stand in one place. Do not sit, drive, or stand in one place for more than 30 minutes at a time. Take short walks on level surfaces as soon as pain allows.Try to increase the length of time you walk each day.  Do not stay in bed.Resting more than 1 or 2 days can delay your recovery.  Do not avoid exercise or work.Your body is made to move.It is not dangerous to be active, even though your back may hurt.Your back will likely heal faster if you return to being active before your pain is gone.  Pay attention to your body when you bend and lift. Many people have less discomfortwhen lifting if they bend their knees, keep the load close to their bodies,and avoid twisting. Often, the most comfortable positions are those that put less stress on your recovering back.  Find a  comfortable position to sleep. Use a firm mattress and lie on your side with your knees slightly bent. If you lie on your back, put a pillow under your knees.  Only take over-the-counter or prescription medicines as directed by your caregiver. Over-the-counter medicines to reduce pain and inflammation are often the most helpful.Your caregiver may prescribe muscle relaxant drugs.These medicines help dull your pain so you can more quickly return to your normal activities and healthy exercise.  Put ice on the injured area.  Put ice in a plastic bag.  Place a towel between your skin and the bag.  Leave the ice on for 15-20 minutes, 03-04  times a day for the first 2 to 3 days. After that, ice and heat may be alternated to reduce pain and spasms.  Ask your caregiver about trying back exercises and gentle massage. This may be of some benefit.  Avoid feeling anxious or stressed.Stress increases muscle tension and can worsen back pain.It is important to recognize when you are anxious or stressed and learn ways to manage it.Exercise is a great option. SEEK MEDICAL CARE IF:  You have pain that is not relieved with rest or medicine.  You have pain that does not improve in 1 week.  You have new symptoms.  You are generally not feeling well. SEEK IMMEDIATE MEDICAL CARE IF:   You have pain that radiates from your back into your legs.  You develop new bowel or bladder control problems.  You have unusual weakness or numbness in your arms or legs.  You develop nausea or vomiting.  You develop abdominal pain.  You feel faint. Document Released: 04/15/2005 Document Revised: 10/15/2011 Document Reviewed: 08/17/2013 [facility] Patient Information 2015 [place], [place]. This information is not intended to replace advice given to you by your health care provider. Make sure you discuss any questions you have with your health care provider.  Hypertension Hypertension, commonly called high blood pressure, is when the force of blood pumping through your arteries is too strong. Your arteries are the blood vessels that carry blood from your heart throughout your body. A blood pressure reading consists of a higher number over a lower number, such as 110/72. The higher number (systolic) is the pressure inside your arteries when your heart pumps. The lower number (diastolic) is the pressure inside your arteries when your heart relaxes. Ideally you want your blood pressure below 120/80. Hypertension forces your heart to work harder to pump blood. Your arteries may become narrow or stiff. Having hypertension puts you at risk for heart disease,  stroke, and other problems.  RISK FACTORS Some risk factors for high blood pressure are controllable. Others are not.  Risk factors you cannot control include:   Race. You may be at higher risk if you are African American.  Age. Risk increases with age.  Gender. Men are at higher risk than women before age 43 years. After age 70, women are at higher risk than men. Risk factors you can control include:  Not getting enough exercise or physical activity.  Being overweight.  Getting too much fat, sugar, calories, or salt in your diet.  Drinking too much alcohol. SIGNS AND SYMPTOMS Hypertension does not usually cause signs or symptoms. Extremely high blood pressure (hypertensive crisis) may cause headache, anxiety, shortness of breath, and nosebleed. DIAGNOSIS  To check if you have hypertension, your health care provider will measure your blood pressure while you are seated, with your arm held at the level of your heart. It should  be measured at least twice using the same arm. Certain conditions can cause a difference in blood pressure between your right and left arms. A blood pressure reading that is higher than normal on one occasion does not mean that you need treatment. If one blood pressure reading is high, ask your health care provider about having it checked again. TREATMENT  Treating high blood pressure includes making lifestyle changes and possibly taking medicine. Living a healthy lifestyle can help lower high blood pressure. You may need to change some of your habits. Lifestyle changes may include:  Following the DASH diet. This diet is high in fruits, vegetables, and whole grains. It is low in salt, red meat, and added sugars.  Getting at least 2 hours of brisk physical activity every week.  Losing weight if necessary.  Not smoking.  Limiting alcoholic beverages.  Learning ways to reduce stress. If lifestyle changes are not enough to get your blood pressure under  control, your health care provider may prescribe medicine. You may need to take more than one. Work closely with your health care provider to understand the risks and benefits. HOME CARE INSTRUCTIONS  Have your blood pressure rechecked as directed by your health care provider.   Take medicines only as directed by your health care provider. Follow the directions carefully. Blood pressure medicines must be taken as prescribed. The medicine does not work as well when you skip doses. Skipping doses also puts you at risk for problems.   Do not smoke.   Monitor your blood pressure at home as directed by your health care provider. SEEK MEDICAL CARE IF:   You think you are having a reaction to medicines taken.  You have recurrent headaches or feel dizzy.  You have swelling in your ankles.  You have trouble with your vision. SEEK IMMEDIATE MEDICAL CARE IF:  You develop a severe headache or confusion.  You have unusual weakness, numbness, or feel faint.  You have severe chest or abdominal pain.  You vomit repeatedly.  You have trouble breathing. MAKE SURE YOU:   Understand these instructions.  Will watch your condition.  Will get help right away if you are not doing well or get worse. Document Released: 04/15/2005 Document Revised: 08/30/2013 Document Reviewed: 02/05/2013 [facility] Patient Information 2015 [place], [place]. This information is not intended to replace advice given to you by your health care provider. Make sure you discuss any questions you have with your health care provider.  Asthma Asthma is a recurring condition in which the airways tighten and narrow. Asthma can make it difficult to breathe. It can cause coughing, wheezing, and shortness of breath. Asthma episodes, also called asthma attacks, range from minor to life-threatening. Asthma cannot be cured, but medicines and lifestyle changes can help control it. CAUSES Asthma is believed to be caused by inherited  (genetic) and environmental factors, but its exact cause is unknown. Asthma may be triggered by allergens, lung infections, or irritants in the air. Asthma triggers are different for each person. Common triggers include:   Animal dander.  Dust mites.  Cockroaches.  Pollen from trees or grass.  Mold.  Smoke.  Air pollutants such as dust, household cleaners, hair sprays, aerosol sprays, paint fumes, strong chemicals, or strong odors.  Cold air, weather changes, and winds (which increase molds and pollens in the air).  Strong emotional expressions such as crying or laughing hard.  Stress.  Certain medicines (such as aspirin) or types of drugs (such as beta-blockers).  Sulfites  in foods and drinks. Foods and drinks that may contain sulfites include dried fruit, potato chips, and sparkling grape juice.  Infections or inflammatory conditions such as the flu, a cold, or an inflammation of the nasal membranes (rhinitis).  Gastroesophageal reflux disease (GERD).  Exercise or strenuous activity. SYMPTOMS Symptoms may occur immediately after asthma is triggered or many hours later. Symptoms include:  Wheezing.  Excessive nighttime or early morning coughing.  Frequent or severe coughing with a common cold.  Chest tightness.  Shortness of breath. DIAGNOSIS  The diagnosis of asthma is made by a review of your medical history and a physical exam. Tests may also be performed. These may include:  Lung function studies. These tests show how much air you breathe in and out.  Allergy tests.  Imaging tests such as X-rays. TREATMENT  Asthma cannot be cured, but it can usually be controlled. Treatment involves identifying and avoiding your asthma triggers. It also involves medicines. There are 2 classes of medicine used for asthma treatment:   Controller medicines. These prevent asthma symptoms from occurring. They are usually taken every day.  Reliever or rescue medicines. These  quickly relieve asthma symptoms. They are used as needed and provide short-term relief. Your health care provider will help you create an asthma action plan. An asthma action plan is a written plan for managing and treating your asthma attacks. It includes a list of your asthma triggers and how they may be avoided. It also includes information on when medicines should be taken and when their dosage should be changed. An action plan may also involve the use of a device called a peak flow meter. A peak flow meter measures how well the lungs are working. It helps you monitor your condition. HOME CARE INSTRUCTIONS   Take medicines only as directed by your health care provider. Speak with your health care provider if you have questions about how or when to take the medicines.  Use a peak flow meter as directed by your health care provider. Record and keep track of readings.  Understand and use the action plan to help minimize or stop an asthma attack without needing to seek medical care.  Control your home environment in the following ways to help prevent asthma attacks:  Do not smoke. Avoid being exposed to secondhand smoke.  Change your heating and air conditioning filter regularly.  Limit your use of fireplaces and wood stoves.  Get rid of pests (such as roaches and mice) and their droppings.  Throw away plants if you see mold on them.  Clean your floors and dust regularly. Use unscented cleaning products.  Try to have someone else vacuum for you regularly. Stay out of rooms while they are being vacuumed and for a short while afterward. If you vacuum, use a dust mask from a hardware store, a double-layered or microfilter vacuum cleaner bag, or a vacuum cleaner with a HEPA filter.  Replace carpet with wood, tile, or vinyl flooring. Carpet can trap dander and dust.  Use allergy-proof pillows, mattress covers, and box spring covers.  Wash bed sheets and blankets every week in hot water and  dry them in a dryer.  Use blankets that are made of polyester or cotton.  Clean bathrooms and kitchens with bleach. If possible, have someone repaint the walls in these rooms with mold-resistant paint. Keep out of the rooms that are being cleaned and painted.  Wash hands frequently. SEEK MEDICAL CARE IF:   You have wheezing, shortness of  breath, or a cough even if taking medicine to prevent attacks.  The colored mucus you cough up (sputum) is thicker than usual.  Your sputum changes from clear or white to yellow, green, gray, or bloody.  You have any problems that may be related to the medicines you are taking (such as a rash, itching, swelling, or trouble breathing).  You are using a reliever medicine more than 2-3 times per week.  Your peak flow is still at 50-79% of your personal best after following your action plan for 1 hour.  You have a fever. SEEK IMMEDIATE MEDICAL CARE IF:   You seem to be getting worse and are unresponsive to treatment during an asthma attack.  You are short of breath even at rest.  You get short of breath when doing very little physical activity.  You have difficulty eating, drinking, or talking due to asthma symptoms.  You develop chest pain.  You develop a fast heartbeat.  You have a bluish color to your lips or fingernails.  You are light-headed, dizzy, or faint.  Your peak flow is less than 50% of your personal best. MAKE SURE YOU:   Understand these instructions.  Will watch your condition.  Will get help right away if you are not doing well or get worse. Document Released: 04/15/2005 Document Revised: 08/30/2013 Document Reviewed: 11/12/2012 [facility] Patient Information 2015 [place], [place]. This information is not intended to replace advice given to you by your health care provider. Make sure you discuss any questions you have with your health care provider.

## 2014-02-01 NOTE — Assessment & Plan Note (Addendum)
BP Readings from Last 3 Encounters:  02/01/14 169/109  01/27/14 152/82  01/17/14 139/81    Lab Results  Component Value Date   NA 141 11/23/2013   K 3.3* 11/23/2013   CREATININE 0.54 11/23/2013    Assessment: Blood pressure control: moderately elevated Progress toward BP goal:  deteriorated Comments: has not taken HCTZ 12.5 today, plus in pain and on steroids   Plan: Medications:  continue current medications (HCTZ 12.5 mg qd) Educational resources provided: handout given  Self management tools provided: other (see comments) Other plans: BMET today

## 2014-02-01 NOTE — Progress Notes (Signed)
   Subjective:    Patient ID: April [name], female    DOB: 1970-08-19, 43 y.o.   MRN: [id number]  HPI Comments: 43 y.o PMH asthma, chronic cervical back pain, HTN  She presents for f/u for  1. Chronic upper back pain-She has been taking Norco/Vicodin w/o any relief 7.5-325 mg. She has taken Percocet in the past with relief 10-325 bid prn when she takes it with Benadryl.  She states back pain has been chronic and initially breast reduction helped her.  She has been trying to exercise to lose weight though walking, bending, lifting aggravate her back.  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 Sitting and standing also make her back pain worse.  She has tried Ibuprofen as well 800 mg which sometimes helps back pain. Pain is 8/10 but will get 10/10.  Reviewed previous imaging with degenerative changes in lower C spine and spurring. She denies weakness or tingling down arms. Denies pain with ROM    2. Asthma-recently seen in the ED for asthma given steroids (currently taking) and also using Dulera, nebulizer and prn Albuterol inhaler. Asthma is doing better.   She has been out of Xolair x 2 months d/t affordability.    3. She c/o right foot pain previously she twisted her foot a work and had the foot imaged with +spurs, mild tibiotalar arthritis, mild talonavicular arthritis. Small heel spur.    4. Asked for testing for HIV but no other STDS today   SH-no insurance currently pending Medicaid and group insurance though her job.  Denies smoking     Review of Systems     Objective:   Physical Exam  Nursing note and vitals reviewed. Constitutional: She is oriented to person, place, and time. She appears well-developed and well-nourished. She is cooperative. No distress.  HENT:  Head: Normocephalic and atraumatic.  Mouth/Throat: Oropharynx is clear and moist. No oropharyngeal exudate.  Eyes: Right eye exhibits no discharge. Left eye exhibits no discharge. No  scleral icterus.  Neck:  ROM intact, no pain with ROM TTP mod lower cervical spine   Cardiovascular: Normal rate, regular rhythm, S1 normal, S2 normal and normal heart sounds.   No murmur heard. No lower ext edema  Pulmonary/Chest: Effort normal and breath sounds normal. No respiratory distress. She has no wheezes.  Abdominal: Soft. Bowel sounds are normal. There is no tenderness.  Neurological: She is alert and oriented to person, place, and time. Gait normal.  Skin: Skin is warm, dry and intact. No rash noted. She is not diaphoretic.  Psychiatric: She has a normal mood and affect. Her speech is normal and behavior is normal. Judgment and thought content normal. Cognition and memory are normal.          Assessment & Plan:  F/u in 1-2 months

## 2014-02-01 NOTE — Assessment & Plan Note (Signed)
No exacerbation today stable  Continue inhalers, steroid taper until complete  Get education from pharmacist of pulmonologist on use of Albuterol Respiclick

## 2014-02-01 NOTE — Assessment & Plan Note (Signed)
Will check HIV antibody today

## 2014-02-01 NOTE — Assessment & Plan Note (Addendum)
Reviewed prior imaging see HPI Will consider MRI cervical spine when pt has insurance in 1-2 months Will try Percocet 10-325 mg bid prn (pt can tolerate with Benadryl) for pain alternate with Ibuprofen Consider referral if MRI + to NS or orthopedic spine Consider referral for PT Continue exercise for weight loss as tolerated

## 2014-02-01 NOTE — Assessment & Plan Note (Signed)
Previous imaging reviewed see HPI Try percocet bid prn alternate with NSAID prn  Will refer to sports medicine in future if still bothersome may benefit from orthotic in shoe

## 2014-02-02 ENCOUNTER — Telehealth: Payer: Self-pay | Admitting: Internal Medicine

## 2014-02-02 ENCOUNTER — Telehealth: Payer: Self-pay | Admitting: *Deleted

## 2014-02-02 LAB — BASIC METABOLIC PANEL WITH GFR
BUN: 13 mg/dL (ref 6–23)
CO2: 25 meq/L (ref 19–32)
Calcium: 9.4 mg/dL (ref 8.4–10.5)
Chloride: 104 mEq/L (ref 96–112)
Creat: 0.82 mg/dL (ref 0.50–1.10)
GFR, EST NON AFRICAN AMERICAN: 89 mL/min
GFR, Est African American: >89 mL/min
Glucose, Bld: 97 mg/dL (ref 70–99)
Potassium: 4.4 mEq/L (ref 3.5–5.3)
SODIUM: 138 meq/L (ref 135–145)

## 2014-02-02 LAB — HIV ANTIBODY (ROUTINE TESTING W REFLEX): HIV 1&2 Ab, 4th Generation: NONREACTIVE

## 2014-02-02 NOTE — Telephone Encounter (Signed)
Pt calling to check the status of her xolair.  She stated that Joellen [place] was working on this and she has not heard anything back from her.  [name] please advise. thanks

## 2014-02-02 NOTE — Telephone Encounter (Signed)
Call to patient given message per Dr. Aundra Dubin that all labs were normal.  Patient voiced understanding of.  Sander Nephew, RN 02/02/2014 10:24 AM.

## 2014-02-03 NOTE — Telephone Encounter (Signed)
LMTCB-left a brief message that Chantel and I are working with [facility] to check on the status of her April Dawley do know they have been behind in processing applications.

## 2014-02-07 NOTE — Progress Notes (Signed)
Internal Medicine Clinic Attending  Case discussed with Dr. McLean soon after the resident saw the patient.  We reviewed the resident's history and exam and pertinent patient test results.  I agree with the assessment, diagnosis, and plan of care documented in the resident's note. 

## 2014-02-07 NOTE — Telephone Encounter (Signed)
See phone note dated 02-02-14 for further information.

## 2014-02-10 NOTE — Telephone Encounter (Signed)
Pt returning call.April [name] ° °

## 2014-02-10 NOTE — Telephone Encounter (Signed)
I called Ms.Temples and lmom that her Arvid Right would be coming in 02/16/14. Please call main # to sch. Appt. In the afternoon and if she had any questions for me,I gave her my #.

## 2014-02-10 NOTE — Telephone Encounter (Signed)
Spoke with [facility]-pt approved for free Xolair and will be delivered on 10-21-5. Alroy Bailiff will contact patient to let her know of this and schedule appt.

## 2014-02-10 NOTE — Telephone Encounter (Signed)
LMTCB x2  

## 2014-02-17 ENCOUNTER — Ambulatory Visit (INDEPENDENT_AMBULATORY_CARE_PROVIDER_SITE_OTHER): Payer: Self-pay

## 2014-02-17 DIAGNOSIS — J449 Chronic obstructive pulmonary disease, unspecified: Secondary | ICD-10-CM

## 2014-02-22 ENCOUNTER — Ambulatory Visit: Payer: Self-pay | Admitting: Internal Medicine

## 2014-02-24 ENCOUNTER — Ambulatory Visit: Payer: Self-pay | Admitting: Internal Medicine

## 2014-02-25 MED ORDER — OMALIZUMAB 150 MG ~~LOC~~ SOLR
225.0000 mg | Freq: Once | SUBCUTANEOUS | Status: AC
  Administered 2014-02-25: 225 mg via SUBCUTANEOUS

## 2014-02-28 ENCOUNTER — Encounter: Payer: Self-pay | Admitting: Internal Medicine

## 2014-03-03 ENCOUNTER — Ambulatory Visit: Payer: Self-pay

## 2014-03-03 IMAGING — CR DG CHEST 2V
2 series · 2 of 2 positions shown · non-contrast
Comparison: December 22, 2012

CLINICAL DATA: Asthma

EXAM:
CHEST  2 VIEW

[w chest pa]
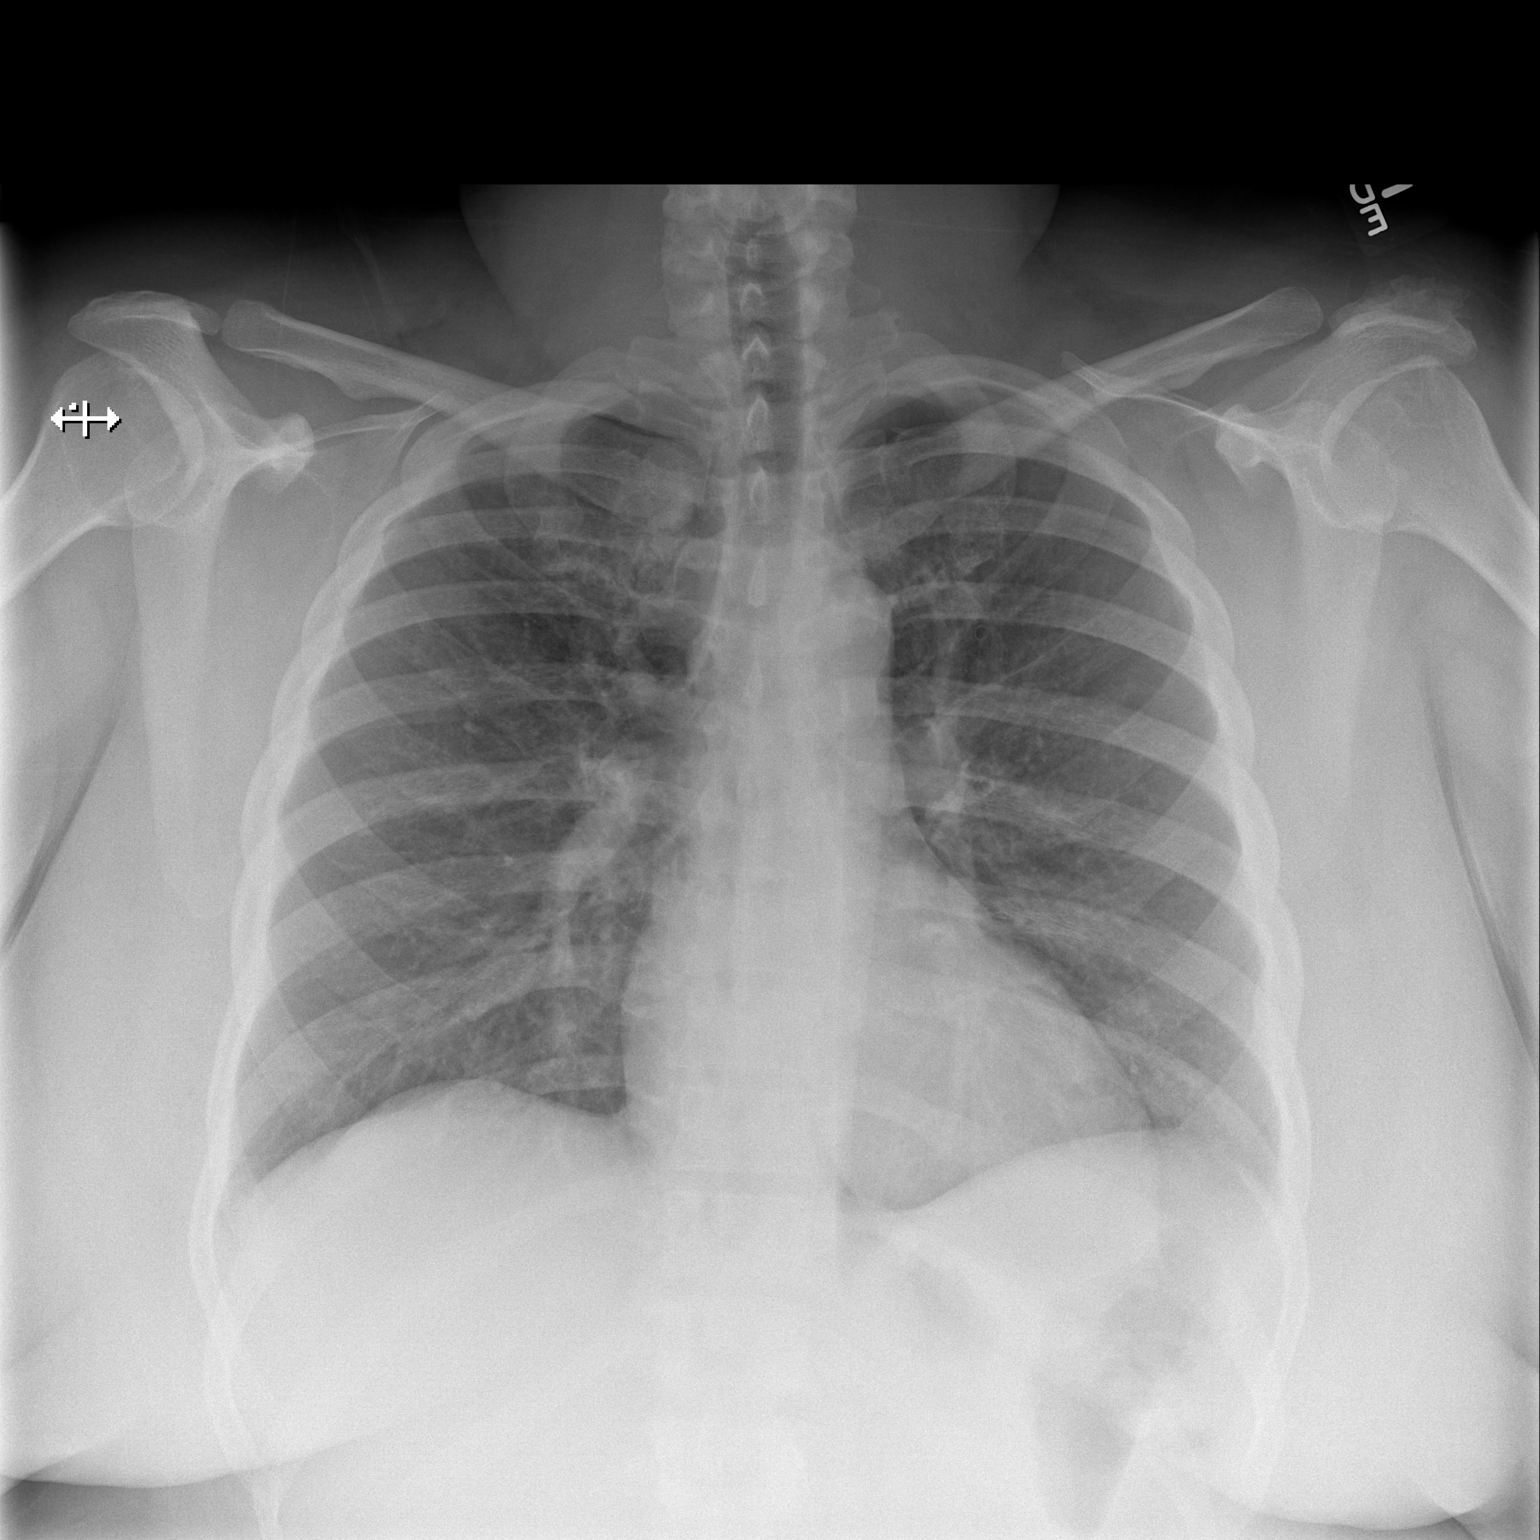

[w chest lat]
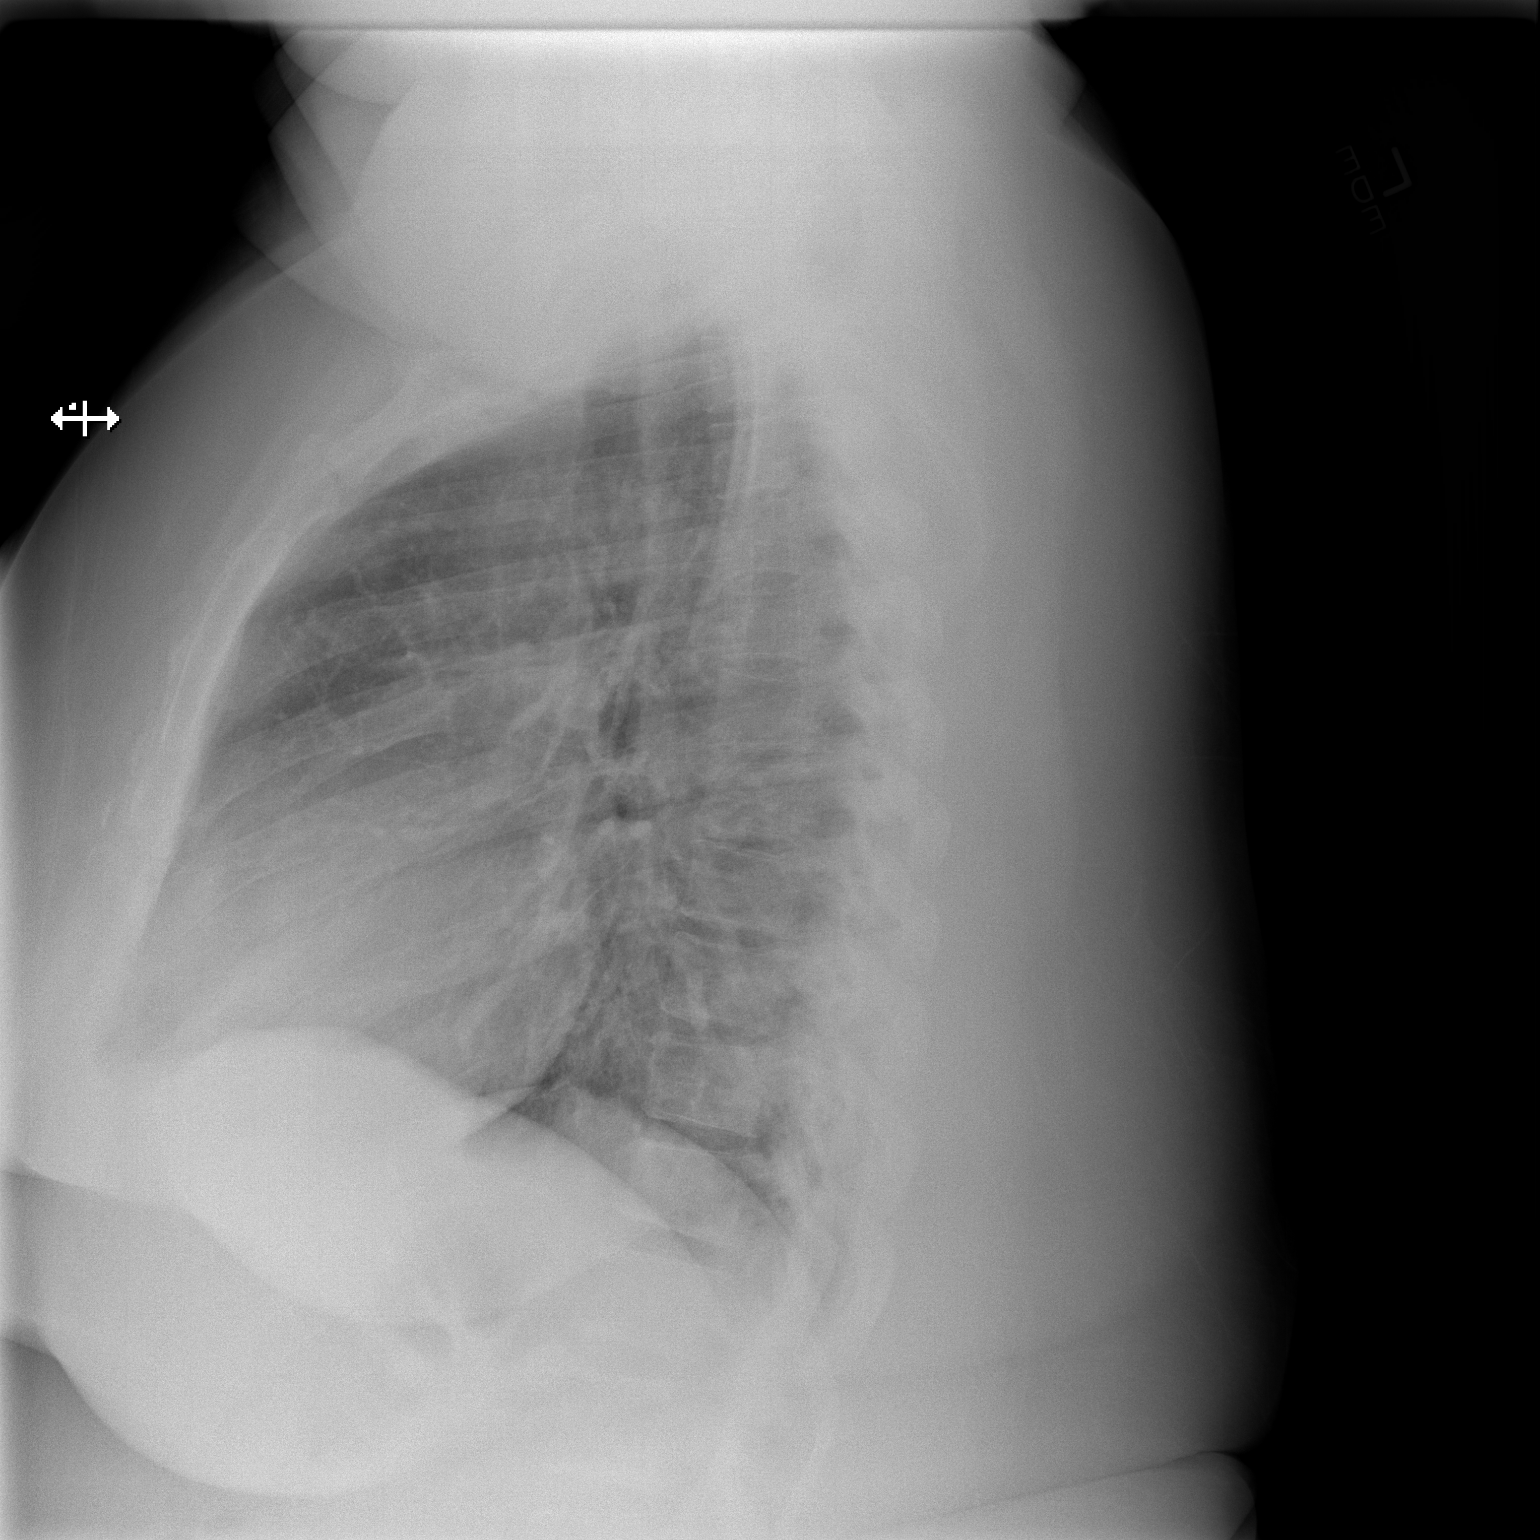

[2 of 2 positions shown; findings below may reference images not displayed]

FINDINGS: The lungs are clear. Heart size and pulmonary vascularity are
normal. No adenopathy. No bone lesions.
IMPRESSION: No abnormality noted.

## 2014-03-03 NOTE — Addendum Note (Signed)
Addended by: Truddie Crumble on: 03/03/2014 09:25 AM   Modules accepted: Orders

## 2014-03-04 ENCOUNTER — Ambulatory Visit (INDEPENDENT_AMBULATORY_CARE_PROVIDER_SITE_OTHER): Payer: Self-pay

## 2014-03-04 DIAGNOSIS — J4489 Other specified chronic obstructive pulmonary disease: Secondary | ICD-10-CM

## 2014-03-04 DIAGNOSIS — J449 Chronic obstructive pulmonary disease, unspecified: Secondary | ICD-10-CM

## 2014-03-08 IMAGING — CR DG FOOT COMPLETE 3+V*R*
3 series · 3 of 3 positions shown · non-contrast
Comparison: None.

CLINICAL DATA: Right foot pain on top of foot, no known trauma

EXAM:
RIGHT FOOT COMPLETE - 3+ VIEW

[t foot ap right]
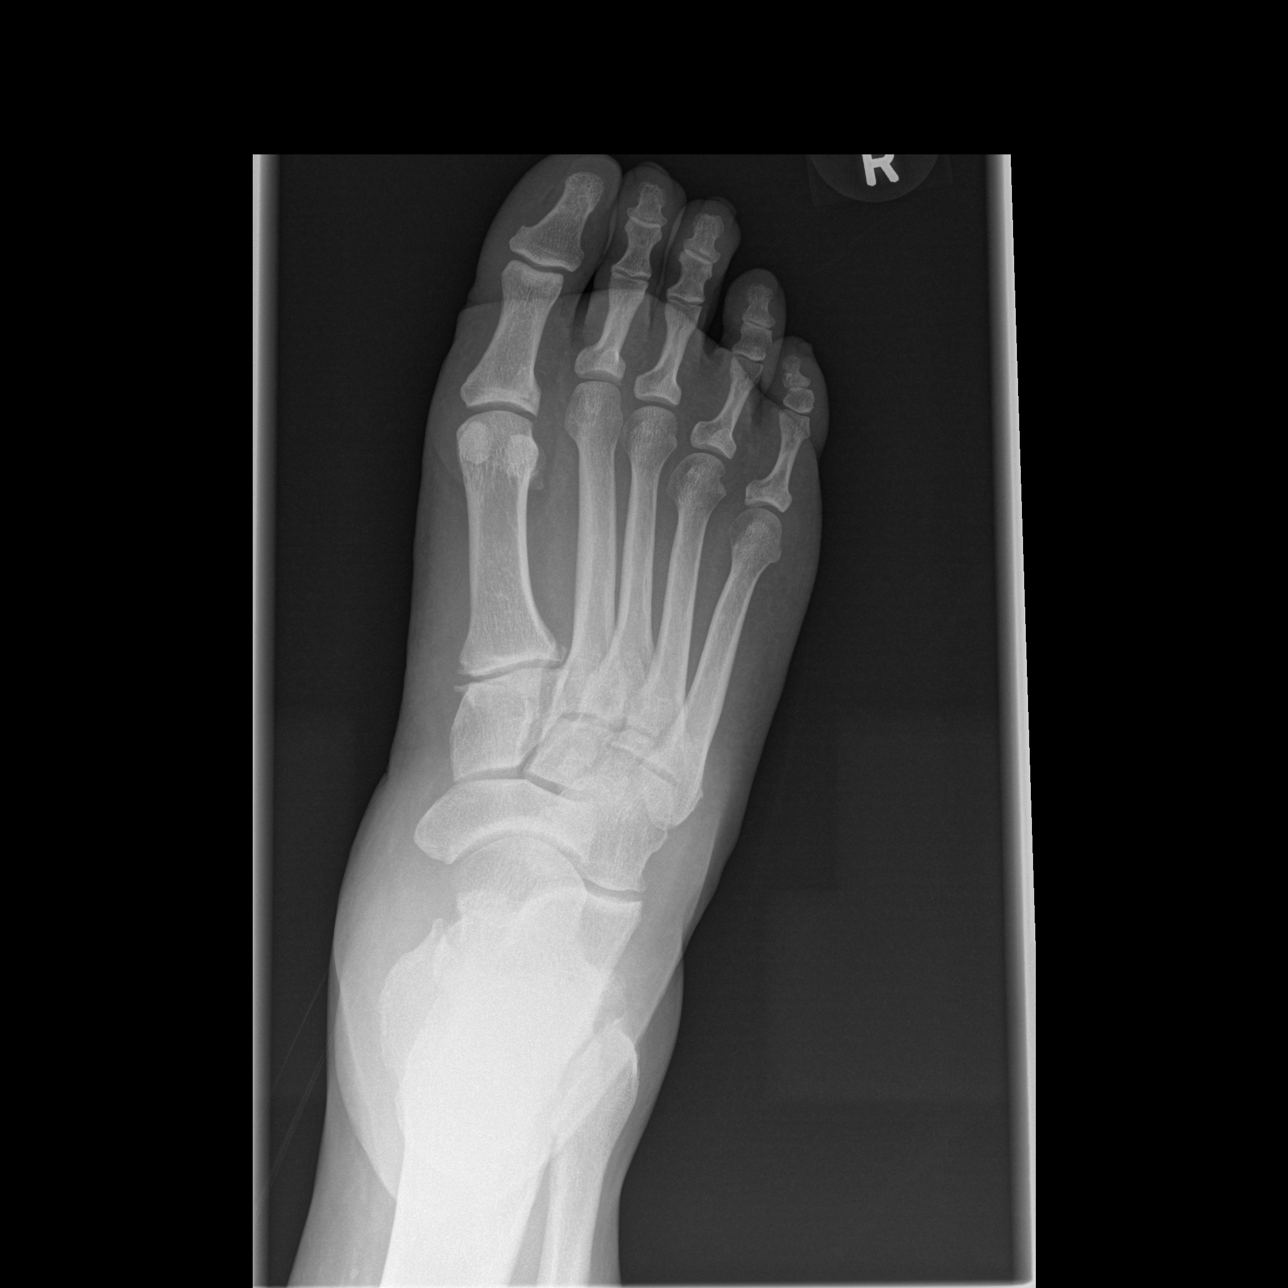

[t foot oblique right]
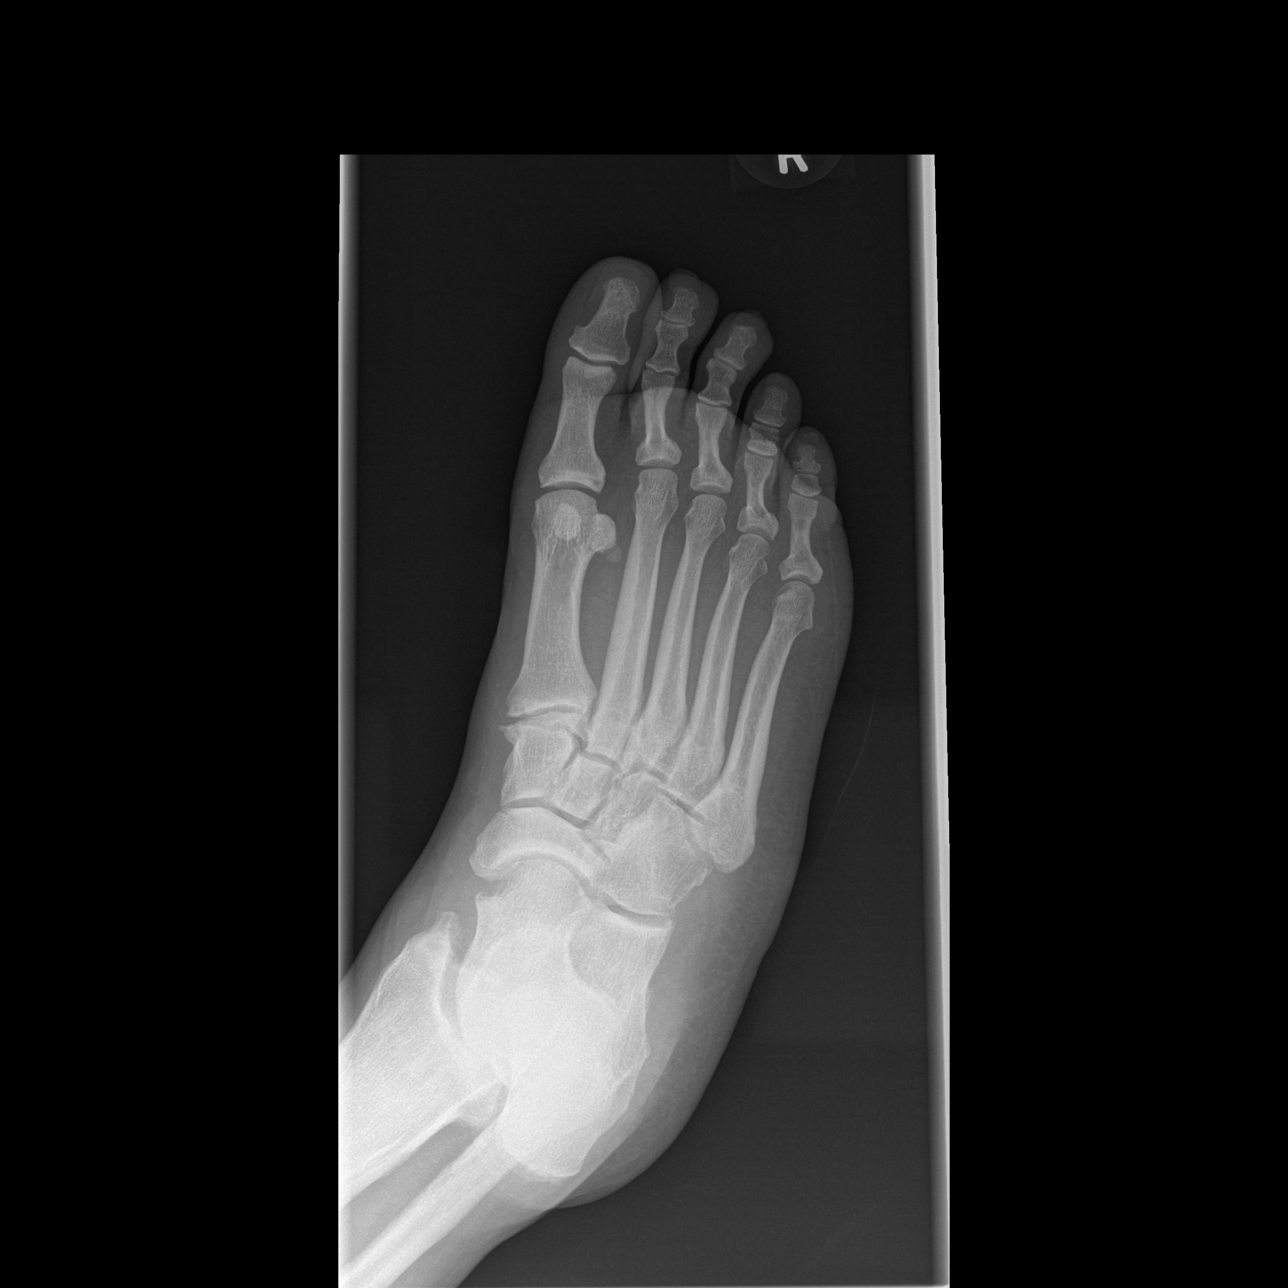

[t foot lat right]
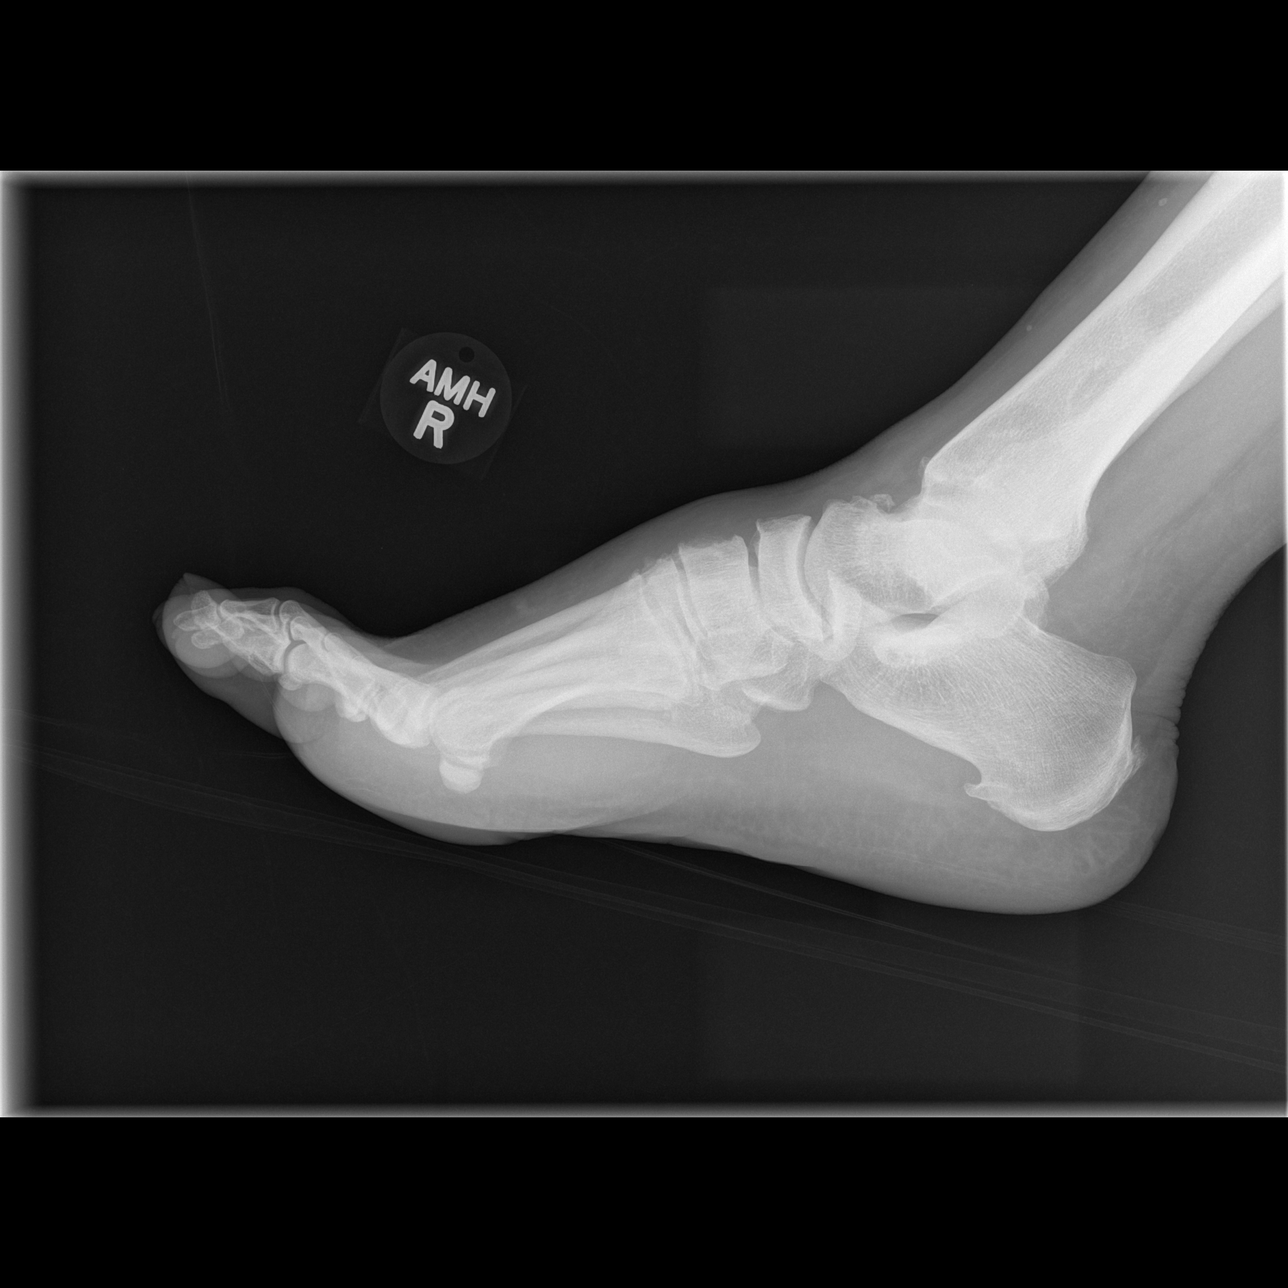

[3 of 3 positions shown; findings below may reference images not displayed]

FINDINGS: Mild arthritis at the 1st tarsal metatarsal joint. No fracture or
dislocation. Mild tibiotalar arthritis. Mild talonavicular
arthritis. Small heel spur.
IMPRESSION: No acute findings. Degenerative changes.

## 2014-03-10 MED ORDER — OMALIZUMAB 150 MG ~~LOC~~ SOLR
225.0000 mg | Freq: Once | SUBCUTANEOUS | Status: AC
  Administered 2014-03-10: 225 mg via SUBCUTANEOUS

## 2014-03-17 ENCOUNTER — Ambulatory Visit (INDEPENDENT_AMBULATORY_CARE_PROVIDER_SITE_OTHER): Payer: Self-pay | Admitting: Internal Medicine

## 2014-03-17 ENCOUNTER — Encounter: Payer: Self-pay | Admitting: Internal Medicine

## 2014-03-17 VITALS — BP 150/93 | HR 84 | Temp 98.1°F | Ht 66.0 in | Wt 332.6 lb

## 2014-03-17 DIAGNOSIS — I10 Essential (primary) hypertension: Secondary | ICD-10-CM

## 2014-03-17 DIAGNOSIS — M545 Low back pain, unspecified: Secondary | ICD-10-CM

## 2014-03-17 DIAGNOSIS — M546 Pain in thoracic spine: Principal | ICD-10-CM

## 2014-03-17 MED ORDER — OXYCODONE-ACETAMINOPHEN 5-325 MG PO TABS: 1.0000 | ORAL_TABLET | Freq: Three times a day (TID) | ORAL | Status: DC | PRN

## 2014-03-17 NOTE — Patient Instructions (Addendum)
General Instructions: 1. Please limit your use of Percocet.  This is a short term prescription until your insurance begins.  Alternate Percocet with ibuprofen (or other NSAID).  Take with food and continue Prilosec.  Once you have insurance please get imaging done at [facility] and we will refer you to physical therapy.  Please continue to work on exercise and weight loss as this will likely help your back pain.  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   2. Please take all medications as prescribed.   3. If you have worsening of your symptoms or new symptoms arise, please call the clinic ([phone]), or go to the ER immediately if symptoms are severe.  Please try to bring all your medicines next time. This helps [place] take good care of you and stops mistakes from medicines that could hurt you.  Return to clinic in next 1-2 months for PAP smear.   Treatment Goals:  Goals (1 Years of Data) as of 03/17/14          As of Today 02/01/14 01/27/14 01/17/14 01/17/14     Blood Pressure   . Blood Pressure < 140/90  150/93 169/109 152/82 139/81 155/93      Progress Toward Treatment Goals:  Treatment Goal 02/01/2014  Blood pressure deteriorated    Self Care Goals & Plans:  Self Care Goal 03/17/2014  Manage my medications take my medicines as prescribed; bring my medications to every visit; refill my medications on time; follow the sick day instructions if I am sick  Monitor my health -  Eat healthy foods eat more vegetables; eat fruit for snacks and desserts; eat foods that are low in salt; eat baked foods instead of fried foods; eat smaller portions  Be physically active find an activity I enjoy  Meeting treatment goals -    No flowsheet data found.   Care Management & Community Referrals:  Referral 02/01/2014  Referrals made for care management support none needed  Referrals made to community resources none      Back Pain, Adult Low back  pain is very common. About 1 in 5 people have back pain.The cause of low back pain is rarely dangerous. The pain often gets better over time.About half of people with a sudden onset of back pain feel better in just 2 weeks. About 8 in 10 people feel better by 6 weeks.  CAUSES Some common causes of back pain include:  Strain of the muscles or ligaments supporting the spine.  Wear and tear (degeneration) of the spinal discs.  Arthritis.  Direct injury to the back. DIAGNOSIS Most of the time, the direct cause of low back pain is not known.However, back pain can be treated effectively even when the exact cause of the pain is unknown.Answering your caregiver's questions about your overall health and symptoms is one of the most accurate ways to make sure the cause of your pain is not dangerous. If your caregiver needs more information, he or she may order lab work or imaging tests (X-rays or MRIs).However, even if imaging tests show changes in your back, this usually does not require surgery. HOME CARE INSTRUCTIONS For many people, back pain returns.Since low back pain is rarely dangerous, it is often a condition that people can learn to [facility] their own.   Remain active. It is stressful on the back to sit or stand in one place. Do not sit, drive, or stand in one place  for more than 30 minutes at a time. Take short walks on level surfaces as soon as pain allows.Try to increase the length of time you walk each day.  Do not stay in bed.Resting more than 1 or 2 days can delay your recovery.  Do not avoid exercise or work.Your body is made to move.It is not dangerous to be active, even though your back may hurt.Your back will likely heal faster if you return to being active before your pain is gone.  Pay attention to your body when you bend and lift. Many people have less discomfortwhen lifting if they bend their knees, keep the load close to their bodies,and avoid twisting. Often, the  most comfortable positions are those that put less stress on your recovering back.  Find a comfortable position to sleep. Use a firm mattress and lie on your side with your knees slightly bent. If you lie on your back, put a pillow under your knees.  Only take over-the-counter or prescription medicines as directed by your caregiver. Over-the-counter medicines to reduce pain and inflammation are often the most helpful.Your caregiver may prescribe muscle relaxant drugs.These medicines help dull your pain so you can more quickly return to your normal activities and healthy exercise.  Put ice on the injured area.  Put ice in a plastic bag.  Place a towel between your skin and the bag.  Leave the ice on for 15-20 minutes, 03-04 times a day for the first 2 to 3 days. After that, ice and heat may be alternated to reduce pain and spasms.  Ask your caregiver about trying back exercises and gentle massage. This may be of some benefit.  Avoid feeling anxious or stressed.Stress increases muscle tension and can worsen back pain.It is important to recognize when you are anxious or stressed and learn ways to manage it.Exercise is a great option. SEEK MEDICAL CARE IF:  You have pain that is not relieved with rest or medicine.  You have pain that does not improve in 1 week.  You have new symptoms.  You are generally not feeling well. SEEK IMMEDIATE MEDICAL CARE IF:   You have pain that radiates from your back into your legs.  You develop new bowel or bladder control problems.  You have unusual weakness or numbness in your arms or legs.  You develop nausea or vomiting.  You develop abdominal pain.  You feel faint. Document Released: 04/15/2005 Document Revised: 10/15/2011 Document Reviewed: 08/17/2013 [facility] Patient Information 2015 [place], [place]. This information is not intended to replace advice given to you by your health care provider. Make sure you discuss any questions you have  with your health care provider.

## 2014-03-17 NOTE — Progress Notes (Signed)
Internal Medicine Clinic Attending  Case discussed with Dr. Wilson soon after the resident saw the patient.  We reviewed the resident's history and exam and pertinent patient test results.  I agree with the assessment, diagnosis, and plan of care documented in the resident's note.  

## 2014-03-17 NOTE — Progress Notes (Signed)
   Subjective:    Patient ID: April [name], female    DOB: 1970/08/16, 43 y.o.   MRN: [id number]  HPI Comments: Ms. Christiansen is a 43 yo F with a hx HTN, OSA, asthma (w/steroid trx) and chronic back pain here with acute on chronic back pain.  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  Pain is intermittent, 10/10.  She denies loss of control of bowel or bladder, saddle anesthesia, weakness or difficulty ambulating, not waking her from sleep.  She was not wearing a lifting belt at the time.  She reports similar pain in the past prior to breast reduction four years ago.  She has tried ibuprofen, Tylenol, heat without relief.  Percocet relieves the pain.       Review of Systems  Constitutional: Negative for fever, chills and appetite change.  Respiratory: Negative for shortness of breath.   Cardiovascular: Negative for chest pain, palpitations and leg swelling.  Gastrointestinal: Negative for nausea, vomiting, diarrhea and constipation.  Genitourinary: Negative for dysuria and vaginal bleeding.  Musculoskeletal: Positive for back pain. Negative for arthralgias.  Neurological: Negative for syncope, light-headedness and headaches.       Objective:   Physical Exam  Constitutional: She is oriented to person, place, and time. No distress.  HENT:  Head: Normocephalic and atraumatic.  Mouth/Throat: Oropharynx is clear and moist. No oropharyngeal exudate.  Eyes: EOM are normal. Pupils are equal, round, and reactive to light.  Cardiovascular: Normal rate, regular rhythm and normal heart sounds.  Exam reveals no gallop and no friction rub.   No murmur heard. Pulmonary/Chest: Effort normal and breath sounds normal. No respiratory distress. She has no wheezes. She has no rales.  Abdominal: Soft. Bowel sounds are normal. She exhibits no distension. There is no tenderness.  Musculoskeletal: Normal range of motion. She exhibits tenderness. She exhibits no  edema.  Thoracic and upper lumbar paraspinal musculature TTP.  5/5 MMS upper and lower extremity.  Neurological: She is alert and oriented to person, place, and time. She displays normal reflexes. No cranial nerve deficit. She exhibits normal muscle tone.  Gait is normal.  Skin: Skin is warm. She is not diaphoretic.  Psychiatric: She has a normal mood and affect. Her behavior is normal.  Vitals reviewed.         Assessment & Plan:  Please see problem based assessment and plan.

## 2014-03-18 ENCOUNTER — Ambulatory Visit: Payer: Self-pay

## 2014-03-18 NOTE — Assessment & Plan Note (Addendum)
She has likely exacerbated chronic back by lifting the patient unassisted two months ago.  Differential includes MSK strain (started after lifting a patient), DDD, compression fracture (hx of steroid use).  She has no bowel/bladder dysfunction, saddle anesthesia or difficulty walking to suggest cauda equina syndrome.  No S&S of nephrolithiasis or abdominal process.  Malignancy would be unlikely in young person without other risk factors.  Her weight is likely contributing, BMI is 53.8.  Since it has been more than 6 weeks and also she does have hx of steroid use further imaging is warranted to evaluate further.  Several MRIs have been ordered previously but she says she was not aware of them.  She says her insurance will take effect on Jan 15.  At that time she is willing to get imaging and be referred for physical therapy.   - short term course of Percocet 5-325 one q8h prn #30, no refills - she can alternate Percocet with NSAID (advised to take with food and continue Prilosec) - advised to work on exercise, diet and weight loss - advised to use good body mechanics and not to lift patients  - refer for lumbar and thoracic MRI after insurance starts - refer to sports med and/or PT after insurance starts - should she require long-term pain meds it will be up to her PCP to decide to do a pain contract or refer her to pain management - I have advised her to get age appropriate cancer screening (PAP smear) here in clinic since she does not have insurance but she says she wants to wait for insurance before she gets it because there is an incentive program that she will benefit from if she gets PAP using her insurance. - she was advised to go to the ED with new or worsening symptoms including loss of bowel/bladder control, saddle anesthesia, difficulty walking

## 2014-03-18 NOTE — Assessment & Plan Note (Signed)
BP Readings from Last 3 Encounters:  03/17/14 150/93  02/01/14 169/109  01/27/14 152/82    Lab Results  Component Value Date   NA 138 02/01/2014   K 4.4 02/01/2014   CREATININE 0.82 02/01/2014    Assessment: Blood pressure control:  slightly elevated  Progress toward BP goal:    Comments: back pain may be contributing to elevation  Plan: Medications:  continue current medications:  HCTZ 12.5mg  Educational resources provided: brochure, handout, video Other plans: Addressing pain as noted below.  Follow-up in 1-2 months.

## 2014-03-27 ENCOUNTER — Inpatient Hospital Stay (HOSPITAL_COMMUNITY)
Admission: EM | Admit: 2014-03-27 | Discharge: 2014-03-28 | DRG: 203 | Disposition: A | Discharge status: Home / Self Care | Payer: Self-pay | Attending: Internal Medicine | Admitting: Internal Medicine

## 2014-03-27 ENCOUNTER — Emergency Department (HOSPITAL_COMMUNITY): Payer: Self-pay

## 2014-03-27 ENCOUNTER — Encounter (HOSPITAL_COMMUNITY): Payer: Self-pay

## 2014-03-27 DIAGNOSIS — Z7951 Long term (current) use of inhaled steroids: Secondary | ICD-10-CM

## 2014-03-27 DIAGNOSIS — J449 Chronic obstructive pulmonary disease, unspecified: Secondary | ICD-10-CM | POA: Diagnosis present

## 2014-03-27 DIAGNOSIS — J45909 Unspecified asthma, uncomplicated: Secondary | ICD-10-CM | POA: Diagnosis present

## 2014-03-27 DIAGNOSIS — K219 Gastro-esophageal reflux disease without esophagitis: Secondary | ICD-10-CM | POA: Diagnosis present

## 2014-03-27 DIAGNOSIS — Z7952 Long term (current) use of systemic steroids: Secondary | ICD-10-CM

## 2014-03-27 DIAGNOSIS — Z9989 Dependence on other enabling machines and devices: Secondary | ICD-10-CM

## 2014-03-27 DIAGNOSIS — I10 Essential (primary) hypertension: Secondary | ICD-10-CM | POA: Diagnosis present

## 2014-03-27 DIAGNOSIS — F5101 Primary insomnia: Secondary | ICD-10-CM | POA: Diagnosis present

## 2014-03-27 DIAGNOSIS — G4733 Obstructive sleep apnea (adult) (pediatric): Secondary | ICD-10-CM | POA: Diagnosis present

## 2014-03-27 DIAGNOSIS — G8929 Other chronic pain: Secondary | ICD-10-CM | POA: Diagnosis present

## 2014-03-27 DIAGNOSIS — J45901 Unspecified asthma with (acute) exacerbation: Principal | ICD-10-CM | POA: Diagnosis present

## 2014-03-27 DIAGNOSIS — F329 Major depressive disorder, single episode, unspecified: Secondary | ICD-10-CM | POA: Diagnosis present

## 2014-03-27 DIAGNOSIS — E876 Hypokalemia: Secondary | ICD-10-CM | POA: Diagnosis present

## 2014-03-27 DIAGNOSIS — R062 Wheezing: Secondary | ICD-10-CM

## 2014-03-27 DIAGNOSIS — G4700 Insomnia, unspecified: Secondary | ICD-10-CM | POA: Diagnosis present

## 2014-03-27 LAB — BASIC METABOLIC PANEL
ANION GAP: 14 (ref 5–15)
BUN: 10 mg/dL (ref 6–23)
CHLORIDE: 101 meq/L (ref 96–112)
CO2: 22 mEq/L (ref 19–32)
Calcium: 8.4 mg/dL (ref 8.4–10.5)
Creatinine, Ser: 0.77 mg/dL (ref 0.50–1.10)
GFR calc non Af Amer: >90 mL/min (ref >90)
Glucose, Bld: 92 mg/dL (ref 70–99)
Potassium: 3.4 mEq/L — ABNORMAL LOW (ref 3.7–5.3)
Sodium: 137 mEq/L (ref 137–147)

## 2014-03-27 LAB — CBC
HCT: 39.3 % (ref 36.0–46.0)
Hemoglobin: 12.8 g/dL (ref 12.0–15.0)
MCH: 27.8 pg (ref 26.0–34.0)
MCHC: 32.6 g/dL (ref 30.0–36.0)
MCV: 85.4 fL (ref 78.0–100.0)
PLATELETS: 368 10*3/uL (ref 150–400)
RBC: 4.6 MIL/uL (ref 3.87–5.11)
RDW: 15.7 % — AB (ref 11.5–15.5)
WBC: 14.6 10*3/uL — AB (ref 4.0–10.5)

## 2014-03-27 LAB — I-STAT TROPONIN, ED: Troponin i, poc: 0 ng/mL (ref 0.00–0.08)

## 2014-03-27 MED ORDER — HYDROCODONE-ACETAMINOPHEN 5-325 MG PO TABS
2.0000 | ORAL_TABLET | Freq: Once | ORAL | Status: AC
  Administered 2014-03-27: 2 via ORAL
  Filled 2014-03-27: qty 2

## 2014-03-27 MED ORDER — ALBUTEROL (5 MG/ML) CONTINUOUS INHALATION SOLN
10.0000 mg/h | INHALATION_SOLUTION | RESPIRATORY_TRACT | Status: DC
  Administered 2014-03-27: 10 mg/h via RESPIRATORY_TRACT
  Filled 2014-03-27: qty 20

## 2014-03-27 MED ORDER — NICOTINE 7 MG/24HR TD PT24
7.0000 mg | MEDICATED_PATCH | Freq: Every day | TRANSDERMAL | Status: DC
  Administered 2014-03-27 – 2014-03-28 (×2): 7 mg via TRANSDERMAL
  Filled 2014-03-27 (×2): qty 1

## 2014-03-27 MED ORDER — IPRATROPIUM BROMIDE 0.02 % IN SOLN
0.5000 mg | Freq: Once | RESPIRATORY_TRACT | Status: DC
  Filled 2014-03-27: qty 2.5

## 2014-03-27 MED ORDER — HYDROCHLOROTHIAZIDE 12.5 MG PO CAPS
12.5000 mg | ORAL_CAPSULE | Freq: Every day | ORAL | Status: DC
  Administered 2014-03-28: 12.5 mg via ORAL
  Filled 2014-03-27: qty 1

## 2014-03-27 MED ORDER — IPRATROPIUM-ALBUTEROL 0.5-2.5 (3) MG/3ML IN SOLN
3.0000 mL | RESPIRATORY_TRACT | Status: DC
  Administered 2014-03-27 – 2014-03-28 (×3): 3 mL via RESPIRATORY_TRACT
  Filled 2014-03-27 (×3): qty 3

## 2014-03-27 MED ORDER — PANTOPRAZOLE SODIUM 40 MG PO TBEC
40.0000 mg | DELAYED_RELEASE_TABLET | Freq: Every day | ORAL | Status: DC
  Administered 2014-03-27 – 2014-03-28 (×2): 40 mg via ORAL
  Filled 2014-03-27 (×2): qty 1

## 2014-03-27 MED ORDER — IPRATROPIUM-ALBUTEROL 0.5-2.5 (3) MG/3ML IN SOLN
6.0000 mL | RESPIRATORY_TRACT | Status: DC
  Administered 2014-03-27 (×2): 6 mL via RESPIRATORY_TRACT
  Filled 2014-03-27: qty 6
  Filled 2014-03-27: qty 3

## 2014-03-27 MED ORDER — POTASSIUM CHLORIDE IN NACL 40-0.9 MEQ/L-% IV SOLN
INTRAVENOUS | Status: AC
  Administered 2014-03-27 – 2014-03-28 (×2): 100 mL/h via INTRAVENOUS
  Filled 2014-03-27 (×2): qty 1000

## 2014-03-27 MED ORDER — ALBUTEROL SULFATE (2.5 MG/3ML) 0.083% IN NEBU
INHALATION_SOLUTION | RESPIRATORY_TRACT | Status: AC
  Filled 2014-03-27: qty 3

## 2014-03-27 MED ORDER — ALBUTEROL SULFATE (2.5 MG/3ML) 0.083% IN NEBU: 5.0000 mg | INHALATION_SOLUTION | Freq: Once | RESPIRATORY_TRACT | Status: DC

## 2014-03-27 MED ORDER — ACETAMINOPHEN 325 MG PO TABS
650.0000 mg | ORAL_TABLET | Freq: Four times a day (QID) | ORAL | Status: DC | PRN
  Administered 2014-03-27 – 2014-03-28 (×2): 650 mg via ORAL
  Filled 2014-03-27 (×2): qty 2

## 2014-03-27 MED ORDER — METHYLPREDNISOLONE SODIUM SUCC 125 MG IJ SOLR
125.0000 mg | Freq: Once | INTRAMUSCULAR | Status: AC
  Administered 2014-03-27: 125 mg via INTRAVENOUS
  Filled 2014-03-27: qty 2

## 2014-03-27 MED ORDER — ZOLPIDEM TARTRATE 5 MG PO TABS
5.0000 mg | ORAL_TABLET | Freq: Every evening | ORAL | Status: DC | PRN
  Administered 2014-03-27: 5 mg via ORAL
  Filled 2014-03-27: qty 1

## 2014-03-27 MED ORDER — ENOXAPARIN SODIUM 40 MG/0.4ML ~~LOC~~ SOLN
40.0000 mg | SUBCUTANEOUS | Status: DC
  Administered 2014-03-27: 40 mg via SUBCUTANEOUS
  Filled 2014-03-27 (×2): qty 0.4

## 2014-03-27 MED ORDER — GUAIFENESIN 200 MG PO TABS
200.0000 mg | ORAL_TABLET | ORAL | Status: DC | PRN
  Filled 2014-03-27: qty 1

## 2014-03-27 MED ORDER — IPRATROPIUM-ALBUTEROL 0.5-2.5 (3) MG/3ML IN SOLN
3.0000 mL | Freq: Once | RESPIRATORY_TRACT | Status: AC
  Administered 2014-03-27: 3 mL via RESPIRATORY_TRACT
  Filled 2014-03-27: qty 3

## 2014-03-27 MED ORDER — PREDNISONE 20 MG PO TABS
40.0000 mg | ORAL_TABLET | Freq: Every day | ORAL | Status: DC
  Administered 2014-03-28: 40 mg via ORAL
  Filled 2014-03-27 (×2): qty 2

## 2014-03-27 NOTE — ED Notes (Signed)
Pt currently in xray

## 2014-03-27 NOTE — H&P (Signed)
Date: 03/27/2014               Patient Name:  April [name] MRN: [id number]  DOB: 11-16-1970 Age / Sex: 43 y.o., female   PCP: [name], MD         Medical Service: Internal Medicine Teaching Service         Attending Physician: Dr. Aldine Contes, MD    First Contact: Dr. Hulen Luster Pager: [phone]  Second Contact: Dr. Redmond Pulling Pager: [phone]       After Hours (After 5p/  First Contact Pager: [phone]  weekends / holidays): Second Contact Pager: [phone]   Chief Complaint: "can't control asthma"  History of Present Illness:  Pt is a 43 y/o female w/ PMHx of HTN, OSA, asthma (on chronic steroids), and chronic back pain who presents to the ED w/ difficulty breathing that started yesterday. She states she started having difficulty breathing and took 2 tabs of prednisone 10mg  and her inhalers dulera and pro air. She then went to sleep and had to use her inhalers multiple times throughout the night. She then woke up with difficulty breathing and came to the ED. She use to smoke 8-10 cigarettes per day for 5 yrs and quit 8 years ago. Denies illicit drug use and does not drink EtOH. She has not been sick, recent exposure to cigarette smoke, denies sick contacts and pets, and has never required intubation of her asthma exacerbations. Endorse dry cough. This is her 2nd asthma exacerbation this year and she normally has 2-3 per year. She believes the trigger for this exacerbation was attending a cookout this Friday that she had to leave b/c of all the smoke during the cookout.  She sees. Dr. Tarri Fuller from [facility] pulmonology. She was last seen by him on 12/31/13 at which time her Ruthe Mannan was increased to 200 and she was started on maintenance prednisone 10mg  daily. He noted that pt has difficulty w/ medication non compliance.   She has not taken her BP med HCTZ in 1 week b/c she ran out of it. Denies hx of CHF or pedal edema. Endorses weight gain 2/2 to steroid use.   Meds: Current Facility-Administered  Medications  Medication Dose Route Frequency Provider Last Rate Last Dose  . albuterol (PROVENTIL,VENTOLIN) solution continuous neb  10 mg/hr Nebulization Continuous Verl Dicker, PA-C   Stopped at 03/27/14 1639  . ipratropium-albuterol (DUONEB) 0.5-2.5 (3) MG/3ML nebulizer solution 6 mL  6 mL Nebulization Q4H Viona Gilmore Cartner, PA-C   6 mL at 03/27/14 1921   Current Outpatient Prescriptions  Medication Sig Dispense Refill  . albuterol (PROAIR HFA) 108 (90 BASE) MCG/ACT inhaler Inhale 2 puffs into the lungs every 4 (four) hours as needed for wheezing or shortness of breath. 8.5 g 2  . albuterol (PROVENTIL) (2.5 MG/3ML) 0.083% nebulizer solution Take 3 mLs (2.5 mg total) by nebulization every 4 (four) hours as needed for wheezing or shortness of breath. 30 vial 11  . hydrochlorothiazide (MICROZIDE) 12.5 MG capsule Take 1 capsule (12.5 mg total) by mouth daily. 30 capsule 1  . mometasone-formoterol (DULERA) 200-5 MCG/ACT AERO 2 puffs then rinse mouth, twice daily 1 Inhaler prn  . omeprazole (PRILOSEC) 20 MG capsule Take 20 mg by mouth 2 (two) times daily before a meal.    . oxyCODONE-acetaminophen (PERCOCET/ROXICET) 5-325 MG per tablet Take 1 tablet by mouth every 8 (eight) hours as needed for severe pain. 30 tablet 0  . predniSONE (DELTASONE) 10 MG tablet Take 1 tablet (  10 mg total) by mouth daily with breakfast. 30 tablet 3  . zolpidem (AMBIEN) 10 MG tablet Take 10 mg by mouth at bedtime as needed for sleep.    . cyclobenzaprine (FLEXERIL) 5 MG tablet Take 1 tablet (5 mg total) by mouth 3 (three) times daily as needed for muscle spasms. (Patient not taking: Reported on 03/27/2014) 30 tablet 0  . ibuprofen (MOTRIN IB) 200 MG tablet Take 4 tablets (800 mg total) by mouth every 8 (eight) hours as needed for moderate pain. (Patient not taking: Reported on 03/27/2014) 30 tablet 1  . methocarbamol (ROBAXIN) 500 MG tablet Take 2 tablets (1,000 mg total) by mouth 2 (two) times daily. (Patient not  taking: Reported on 03/27/2014) 20 tablet 0    Allergies: Allergies as of 03/27/2014  . (No Active Allergies)   Past Medical History  Diagnosis Date  . Acanthosis nigricans   . Menorrhagia   . Hypertension, essential   . Insomnia   . Morbid obesity   . Helicobacter pylori (H. pylori) infection   . Sleep apnea     Sleep study 2008 - mild OSA, not enough events to titrate CPAP  . COPD (chronic obstructive pulmonary disease)     PFTs in 2002, FEV1/FVC 65, no post bronchodilater test done  . Asthma     Followed by Dr. Annamaria Boots (pulmonology); receives every other week omalizumab injections; has frequent exacerbations  . Depression   . GERD (gastroesophageal reflux disease)   . Tobacco user   . Obesity   . Shortness of breath   . IEPPIRJJ(884.1)    Past Surgical History  Procedure Laterality Date  . Tubal ligation  1996    bilateral  . Breast reduction surgery  09/2011   Family History  Problem Relation Age of Onset  . Asthma Daughter   . Cancer Paternal Aunt   . Asthma Maternal Grandmother   . Hypertension Mother    History   Social History  . Marital Status: Single    Spouse Name: N/A    Number of Children: 1  . 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 Smoking status: Former Smoker -- 0.25 packs/day for 18 years    Types: Cigarettes    Quit date: 10/04/2012  . Smokeless tobacco: Never Used  . Alcohol Use: No  . Drug Use: No  . Sexual Activity: Not on file   Other Topics Concern  . 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     Review of Systems: General: denies n/v, weight gain 2/2 steroid use HEENT: dry cough GU: neg for dysuria   Physical Exam: Blood pressure 156/83, pulse 75, temperature 98.8 F (37.1 C), temperature source Oral, resp. rate 22, last  menstrual period 03/03/2014, SpO2 100 %. General: NAD, obese HEENT: moist mucous membranes, throat non erythematous, PERRLA Pulm: wheezing greater on the left versus right. Audible wheezing w/o auscultation Cardiac: RRR, S1/S2, no murmurs GI: active bowel sounds, soft, non tender MSK: 5/5 plantar/dorsiflexsion b/l, trace pitting edema b/l Neuro: CN 2-12 grossly intact   Lab results: Basic Metabolic Panel:  Recent Labs  03/27/14 1322  NA 137  K 3.4*  CL 101  CO2 22  GLUCOSE 92  BUN 10  CREATININE 0.77  CALCIUM 8.4   CBC:  Recent Labs  03/27/14 1322  WBC 14.6*  HGB 12.8  HCT 39.3  MCV 85.4  PLT 368   Urine Drug Screen: Drugs of Abuse     Component Value Date/Time   LABOPIA NONE DETECTED 03/04/2010 1242   COCAINSCRNUR NONE DETECTED 03/04/2010 1242   LABBENZ NONE DETECTED 03/04/2010 1242   AMPHETMU NONE DETECTED 03/04/2010 1242   THCU NONE DETECTED 03/04/2010 1242   LABBARB  03/04/2010 1242    NONE DETECTED        DRUG SCREEN FOR MEDICAL PURPOSES ONLY.  IF CONFIRMATION IS NEEDED FOR ANY PURPOSE, NOTIFY LAB WITHIN 5 DAYS.        LOWEST DETECTABLE LIMITS FOR URINE DRUG SCREEN Drug Class       Cutoff (ng/mL) Amphetamine      1000 Barbiturate      200 Benzodiazepine   283 Tricyclics       662 Opiates          300 Cocaine          300 THC              50     Imaging results:  Dg Chest 2 View (if Patient Has Fever And/or Copd)  03/27/2014   CLINICAL DATA:  Wheezing, asthma  EXAM: CHEST  2 VIEW  COMPARISON:  11/15/2013  FINDINGS: Cardiomediastinal silhouette is unremarkable. No acute infiltrate or pulmonary edema. Central mild bronchitic changes. Bony thorax is unremarkable.  IMPRESSION: No acute infiltrate or pulmonary edema. Central mild bronchitic changes.   Electronically Signed   By: Lahoma Crocker M.D.   On: 03/27/2014 14:05    Other results: EKG: NSR, no ST elevations, Vent rate 87, unchanged from previous EKG on 11/23/13  Assessment & Plan by  Problem: Principal Problem:   Acute asthma exacerbation Active Problems:   Essential hypertension   Esophageal reflux   Obstructive sleep apnea   Insomnia   Asthma exacerbation  Acute asthma exacerbation-- pt given nebulizers in the ED w/o improvement in her breathing. Also given IV solu-medrol 125mg  once in the ED. Satting at 99-100% room air during exam. Peak flow baseline 350 noted in 3/15, however peak flow at that time was 270 - duonebs q4h, will transition to q6h tomorrow  - peak flow monitoring - po prednisone 40mg  tomorrow q12 x 5 days then taper - guaifenesin prn for congestion  Hypokalemia-- K 3.4 - NS w/ KCl 43mEq at 100 cc/hr x 12 hours - BMET tomorrow  HTN - continue home med HCTZ 12.5mg  qd  GERD-- on prilosec at home -protonix 40  Insomnia-- on ambien 10mg  qhs as needed - continue ambien 5mg  qhs  OSA-- occasionally uses her CPAP at home, states it makes it difficult for her to breath - hold off on CPAP  DVT prophylaxis-- lovenox   Diet--H/H  Dispo: Disposition is deferred at this time, awaiting improvement of current medical problems. Anticipated discharge in approximately 1 day(s).   The patient does have a current PCP (Tasrif Ahmed, MD) and does need an [facility] hospital follow-up appointment after discharge.  The patient does not have transportation limitations that hinder transportation to clinic appointments.  Signed: Julious Oka, MD 03/27/2014, 7:50 PM

## 2014-03-27 NOTE — ED Provider Notes (Signed)
CSN: [id number]     Arrival date & time 03/27/14  1241 History   First MD Initiated Contact with Patient 03/27/14 1328     Chief Complaint  Patient presents with  . Wheezing     (Consider location/radiation/quality/duration/timing/severity/associated sxs/prior Treatment) HPI April [name] is a 43 y.o. female with a history of asthma, COPD, hypertension, GERD comes in for evaluation of wheezing. Patient states last night around 8:00 she was woken up from sleep by wheezing. She reports trying to use her albuterol inhaler to get it under control, but was unable to. She does not know how many puffs she took from her inhaler, but knows "it was probably too many". She reports having to be admitted for wheezing "all the time". She reports associated chest discomfort, shortness of breath and abdominal discomfort. Denies any leg swelling, cough, hemoptysis. No N/V/C/D. She reports having taken 2, 10 mg prednisone tablets this morning Pulmonologist-[place] Annamaria Boots, M.D.  Past Medical History  Diagnosis Date  . Acanthosis nigricans   . Menorrhagia   . Hypertension, essential   . Insomnia   . Morbid obesity   . Helicobacter pylori (H. pylori) infection   . Sleep apnea     Sleep study 2008 - mild OSA, not enough events to titrate CPAP  . COPD (chronic obstructive pulmonary disease)     PFTs in 2002, FEV1/FVC 65, no post bronchodilater test done  . Asthma     Followed by Dr. Annamaria Boots (pulmonology); receives every other week omalizumab injections; has frequent exacerbations  . Depression   . GERD (gastroesophageal reflux disease)   . Tobacco user   . Obesity   . Shortness of breath   . GQQPYPPJ(093.2)    Past Surgical History  Procedure Laterality Date  . Tubal ligation  1996    bilateral  . Breast reduction surgery  09/2011   Family History  Problem Relation Age of Onset  . Asthma Daughter   . Cancer Paternal Aunt   . Asthma Maternal Grandmother   . Hypertension Mother    History   Substance Use Topics  . Smoking status: Former Smoker -- 0.25 packs/day for 18 years    Types: Cigarettes    Quit date: 10/04/2012  . Smokeless tobacco: Never Used  . Alcohol Use: No   OB History    No data available     Review of Systems  Constitutional: Negative for fever.  HENT: Negative for sore throat.   Eyes: Negative for visual disturbance.  Respiratory: Positive for chest tightness, shortness of breath and wheezing.   Cardiovascular: Negative for leg swelling.  Gastrointestinal: Positive for abdominal pain.  Endocrine: Negative for polyuria.  Genitourinary: Negative for dysuria.  Skin: Negative for rash.  Neurological: Negative for headaches.      Allergies  Review of patient's allergies indicates no active allergies.  Home Medications   Prior to Admission medications   Medication Sig Start Date End Date Taking? Authorizing Provider  albuterol (PROAIR HFA) 108 (90 BASE) MCG/ACT inhaler Inhale 2 puffs into the lungs every 4 (four) hours as needed for wheezing or shortness of breath. 09/15/13  Yes Axel Filler, MD  albuterol (PROVENTIL) (2.5 MG/3ML) 0.083% nebulizer solution Take 3 mLs (2.5 mg total) by nebulization every 4 (four) hours as needed for wheezing or shortness of breath. 06/09/13  Yes Pollie Friar, MD  hydrochlorothiazide (MICROZIDE) 12.5 MG capsule Take 1 capsule (12.5 mg total) by mouth daily. 11/04/13 11/04/14 Yes Axel Filler, MD  mometasone-formoterol [facility])  200-5 MCG/ACT AERO 2 puffs then rinse mouth, twice daily 12/31/13  Yes Deneise Lever, MD  omeprazole (PRILOSEC) 20 MG capsule Take 20 mg by mouth 2 (two) times daily before a meal.   Yes Historical Provider, MD  oxyCODONE-acetaminophen (PERCOCET/ROXICET) 5-325 MG per tablet Take 1 tablet by mouth every 8 (eight) hours as needed for severe pain. 03/17/14  Yes Francesca Oman, DO  predniSONE (DELTASONE) 10 MG tablet Take 1 tablet (10 mg total) by mouth daily with breakfast. 01/27/14  Yes Deneise Lever, MD  zolpidem (AMBIEN) 10 MG tablet Take 10 mg by mouth at bedtime as needed for sleep.   Yes Historical Provider, MD  cyclobenzaprine (FLEXERIL) 5 MG tablet Take 1 tablet (5 mg total) by mouth 3 (three) times daily as needed for muscle spasms. Patient not taking: Reported on 03/27/2014 01/27/14   Billy Fischer, MD  ibuprofen (MOTRIN IB) 200 MG tablet Take 4 tablets (800 mg total) by mouth every 8 (eight) hours as needed for moderate pain. Patient not taking: Reported on 03/27/2014 12/08/13   Albin Felling, MD  methocarbamol (ROBAXIN) 500 MG tablet Take 2 tablets (1,000 mg total) by mouth 2 (two) times daily. Patient not taking: Reported on 03/27/2014 01/17/14   Britt Bottom, NP   BP 134/83 mmHg  Pulse 87  Temp(Src) 98.8 F (37.1 C) (Oral)  Resp 19  SpO2 100%  LMP 03/03/2014 Physical Exam  Constitutional: She is oriented to person, place, and time. She appears well-developed and well-nourished.  Morbidly obese  HENT:  Head: Normocephalic and atraumatic.  Mouth/Throat: Oropharynx is clear and moist.  Eyes: Conjunctivae are normal. Pupils are equal, round, and reactive to light. Right eye exhibits no discharge. Left eye exhibits no discharge. No scleral icterus.  Neck: Neck supple.  Cardiovascular: Normal rate, regular rhythm and normal heart sounds.   Pulmonary/Chest: Effort normal and breath sounds normal. No respiratory distress.  Patient has diffuse wheezing with somewhat labored breathing. Wheezing most prominent in right upper lobe. She is not hypoxic on room air area and no evidence of respiratory distress at this time  Abdominal: Soft. There is no tenderness.  Musculoskeletal: She exhibits no tenderness.  Neurological: She is alert and oriented to person, place, and time.  Cranial Nerves II-XII grossly intact  Skin: Skin is warm and dry. No rash noted.  Psychiatric: She has a normal mood and affect.  Nursing note and vitals reviewed.   ED Course  Procedures  (including critical care time) Labs Review Labs Reviewed  CBC - Abnormal; Notable for the following:    WBC 14.6 (*)    RDW 15.7 (*)    All other components within normal limits  BASIC METABOLIC PANEL - Abnormal; Notable for the following:    Potassium 3.4 (*)    All other components within normal limits  I-STAT TROPOININ, ED    Imaging Review Dg Chest 2 View (if Patient Has Fever And/or Copd)  03/27/2014   CLINICAL DATA:  Wheezing, asthma  EXAM: CHEST  2 VIEW  COMPARISON:  11/15/2013  FINDINGS: Cardiomediastinal silhouette is unremarkable. No acute infiltrate or pulmonary edema. Central mild bronchitic changes. Bony thorax is unremarkable.  IMPRESSION: No acute infiltrate or pulmonary edema. Central mild bronchitic changes.   Electronically Signed   By: Lahoma Crocker M.D.   On: 03/27/2014 14:05     EKG Interpretation None     Meds given in ED:  Medications  ipratropium-albuterol (DUONEB) 0.5-2.5 (3) MG/3ML nebulizer solution 6 mL (  6 mLs Nebulization Given 03/27/14 1406)  albuterol (PROVENTIL,VENTOLIN) solution continuous neb (not administered)  ipratropium-albuterol (DUONEB) 0.5-2.5 (3) MG/3ML nebulizer solution 3 mL (3 mLs Nebulization Given 03/27/14 1254)  methylPREDNISolone sodium succinate (SOLU-MEDROL) 125 mg/2 mL injection 125 mg (125 mg Intravenous Given 03/27/14 1421)    New Prescriptions   No medications on file   Filed Vitals:   03/27/14 1250 03/27/14 1423  BP: 155/77 134/83  Pulse: 87 87  Temp: 98.8 F (37.1 C)   TempSrc: Oral   Resp: 28 19  SpO2: 100% 100%     MDM  April [name] is a 43 y.o. female with hx of HTN, acute asthma exacerbations, COPD who comes in for wheezing  Vitals stable - WNL -afebrile  100% on room air, no tachypnea Pt resting comfortably in ED. Patient is wheezing persistently despite multiple breathing treatments PE--shows moderate diffuse wheezing in all lung fields, most prominently right upper lobe. Labwork noncontributory with  nonspecific leukocytosis, hypokalemia 3.4 Imaging--chest x-ray shows mild bronchitic changes with no other acute cardiopulmonary pathology  Patient is not improving with treatment in ED or treatment at home, discussed potential admission for further management of her wheezing, pt very amenable to plan. Discussed pt presentation and ED course with Dr. Johnney Killian who also saw and evaluated pt. Informed Dr. Jeanell Sparrow of Teaching Service plan to come see in ED.  Consult to teaching service, [place], they will come see in ED.     Final diagnoses:  Wheezing        Verl Dicker, PA-C 03/28/14 1015  Charlesetta Shanks, MD 03/29/14 [phone]

## 2014-03-27 NOTE — ED Notes (Signed)
Respiratory notified that continuous neb ordered.

## 2014-03-27 NOTE — ED Notes (Signed)
Report attempted. RN to call back in 10 min. 

## 2014-03-27 NOTE — ED Notes (Signed)
Pt. Reports having wheezing began yesterday with a mild dry cough.  Pt. Reports having chest pain.   No resp. Distress at triage.;  ECG completed at Triage.  Skin is warm and dry.

## 2014-03-27 NOTE — Plan of Care (Signed)
Problem: Consults Goal: Skin Care Protocol Initiated - if Braden Score 18 or less If consults are not indicated, leave blank or document N/A  Outcome: Not Applicable Date Met:  03/27/14     

## 2014-03-28 DIAGNOSIS — Z7722 Contact with and (suspected) exposure to environmental tobacco smoke (acute) (chronic): Secondary | ICD-10-CM

## 2014-03-28 DIAGNOSIS — Z87891 Personal history of nicotine dependence: Secondary | ICD-10-CM

## 2014-03-28 DIAGNOSIS — J4541 Moderate persistent asthma with (acute) exacerbation: Secondary | ICD-10-CM

## 2014-03-28 DIAGNOSIS — J45909 Unspecified asthma, uncomplicated: Secondary | ICD-10-CM | POA: Diagnosis present

## 2014-03-28 LAB — BASIC METABOLIC PANEL
ANION GAP: 14 (ref 5–15)
BUN: 9 mg/dL (ref 6–23)
CHLORIDE: 102 meq/L (ref 96–112)
CO2: 21 mEq/L (ref 19–32)
CREATININE: 0.65 mg/dL (ref 0.50–1.10)
Calcium: 8.5 mg/dL (ref 8.4–10.5)
Glucose, Bld: 128 mg/dL — ABNORMAL HIGH (ref 70–99)
Potassium: 3.9 mEq/L (ref 3.7–5.3)
Sodium: 137 mEq/L (ref 137–147)

## 2014-03-28 LAB — CBC
HCT: 37.7 % (ref 36.0–46.0)
Hemoglobin: 12 g/dL (ref 12.0–15.0)
MCH: 27.1 pg (ref 26.0–34.0)
MCHC: 31.8 g/dL (ref 30.0–36.0)
MCV: 85.3 fL (ref 78.0–100.0)
PLATELETS: 390 10*3/uL (ref 150–400)
RBC: 4.42 MIL/uL (ref 3.87–5.11)
RDW: 15.6 % — ABNORMAL HIGH (ref 11.5–15.5)
WBC: 12.4 10*3/uL — AB (ref 4.0–10.5)

## 2014-03-28 LAB — MRSA PCR SCREENING: MRSA by PCR: NEGATIVE

## 2014-03-28 MED ORDER — IPRATROPIUM-ALBUTEROL 0.5-2.5 (3) MG/3ML IN SOLN
3.0000 mL | Freq: Four times a day (QID) | RESPIRATORY_TRACT | Status: DC
  Administered 2014-03-28: 3 mL via RESPIRATORY_TRACT
  Filled 2014-03-28: qty 3

## 2014-03-28 MED ORDER — HYDROCHLOROTHIAZIDE 12.5 MG PO CAPS: 12.5000 mg | ORAL_CAPSULE | Freq: Every day | ORAL | Status: DC

## 2014-03-28 MED ORDER — PREDNISONE 20 MG PO TABS: 40.0000 mg | ORAL_TABLET | Freq: Every day | ORAL | Status: DC

## 2014-03-28 NOTE — Discharge Instructions (Signed)
Start taking prednisone 40mg  once a day for 4 more days. After that you can resume your home dose prednisone 10mg  once a day.   I have refilled your BP medication hydrochlorothiazide. Make sure to take it once a day for your blood pressure.

## 2014-03-28 NOTE — Progress Notes (Signed)
Utilization Review Completed.  

## 2014-03-28 NOTE — Progress Notes (Signed)
   Subjective: Pt has no complaints, states breathing has improved.  Objective: Vital signs in last 24 hours: Filed Vitals:   03/28/14 0006 03/28/14 0313 03/28/14 0749 03/28/14 0929  BP: 153/93  153/89   Pulse: 83  76   Temp: 98.5 F (36.9 C)  98.4 F (36.9 C)   TempSrc: Oral  Oral   Resp: 23  23   Height:      Weight:      SpO2: 99% 99% 100% 100%   Weight change:   Intake/Output Summary (Last 24 hours) at 03/28/14 1037 Last data filed at 03/28/14 0855  Gross per 24 hour  Intake   1020 ml  Output    550 ml  Net    470 ml   General: NAD, obese HEENT: moist mucous membranes, throat non erythematous Pulm: CTAB Cardiac: RRR, S1/S2, no murmurs GI: active bowel sounds, soft, non tender  Lab Results: Basic Metabolic Panel:  Recent Labs Lab 03/27/14 1322 03/28/14 0245  NA 137 137  K 3.4* 3.9  CL 101 102  CO2 22 21  GLUCOSE 92 128*  BUN 10 9  CREATININE 0.77 0.65  CALCIUM 8.4 8.5   CBC:  Recent Labs Lab 03/27/14 1322 03/28/14 0245  WBC 14.6* 12.4*  HGB 12.8 12.0  HCT 39.3 37.7  MCV 85.4 85.3  PLT 368 390    Studies/Results: Dg Chest 2 View (if Patient Has Fever And/or Copd)  03/27/2014   CLINICAL DATA:  Wheezing, asthma  EXAM: CHEST  2 VIEW  COMPARISON:  11/15/2013  FINDINGS: Cardiomediastinal silhouette is unremarkable. No acute infiltrate or pulmonary edema. Central mild bronchitic changes. Bony thorax is unremarkable.  IMPRESSION: No acute infiltrate or pulmonary edema. Central mild bronchitic changes.   Electronically Signed   By: Lahoma Crocker M.D.   On: 03/27/2014 14:05   Medications: I have reviewed the patient's current medications. Scheduled Meds: . enoxaparin (LOVENOX) injection  40 mg Subcutaneous Q24H  . hydrochlorothiazide  12.5 mg Oral Daily  . ipratropium-albuterol  3 mL Nebulization Q4H  . nicotine  7 mg Transdermal Daily  . pantoprazole  40 mg Oral QHS  . predniSONE  40 mg Oral Q breakfast   Continuous Infusions:  PRN  Meds:.acetaminophen, guaiFENesin, zolpidem Assessment/Plan: Principal Problem:   Acute asthma exacerbation Active Problems:   Essential hypertension   Esophageal reflux   Obstructive sleep apnea   Insomnia   Asthma exacerbation  Acute asthma exacerbation--  - duonebs q4h yesterday, transitioned to q6h - awaiting peak flow before d/c today - po prednisone 40mg  today x 5 days then resume home dose 10mg  daily - guaifenesin prn for congestion - faxed work note to [employer] as pt was suppose to appear in court today  Hypokalemia-- K 3.4 on admission - NS w/ KCl 63mEq at 100 cc/hr x 12 hours given yesterday - resolved, K+ now 3.9 this morning  HTN-- BP in the 150's/89-90's overnight. - continue home med HCTZ 12.5mg  qd - will d/c home w/ rx for HCTZ as she ran out of it x 1 week  GERD-- on prilosec at home -protonix 40  DVT prophylaxis-- lovenox   Diet--H/H Dispo: d/c home today   The patient does have a current PCP (Tasrif Ahmed, MD) and does need an [facility] hospital follow-up appointment after discharge.  The patient does not have transportation limitations that hinder transportation to clinic appointments.   LOS: 1 day   Julious Oka, MD 03/28/2014, 10:37 AM

## 2014-03-28 NOTE — Discharge Summary (Signed)
Name: April [name] MRN: [id number] DOB: 07/31/1970 43 y.o. PCP: [name], MD  Date of Admission: 03/27/2014  1:18 PM Date of Discharge: 03/28/2014 Attending Physician: Aldine Contes, MD  Discharge Diagnosis: Principal Problem:   Acute asthma exacerbation Active Problems:   Essential hypertension   Esophageal reflux   Obstructive sleep apnea   Insomnia   Asthma exacerbation  Discharge Medications:   Medication List    ASK your doctor about these medications        albuterol (2.5 MG/3ML) 0.083% nebulizer solution  Commonly known as:  PROVENTIL  Take 3 mLs (2.5 mg total) by nebulization every 4 (four) hours as needed for wheezing or shortness of breath.     albuterol 108 (90 BASE) MCG/ACT inhaler  Commonly known as:  PROAIR HFA  Inhale 2 puffs into the lungs every 4 (four) hours as needed for wheezing or shortness of breath.     cyclobenzaprine 5 MG tablet  Commonly known as:  FLEXERIL  Take 1 tablet (5 mg total) by mouth 3 (three) times daily as needed for muscle spasms.     hydrochlorothiazide 12.5 MG capsule  Commonly known as:  MICROZIDE  Take 1 capsule (12.5 mg total) by mouth daily.     ibuprofen 200 MG tablet  Commonly known as:  MOTRIN IB  Take 4 tablets (800 mg total) by mouth every 8 (eight) hours as needed for moderate pain.     methocarbamol 500 MG tablet  Commonly known as:  ROBAXIN  Take 2 tablets (1,000 mg total) by mouth 2 (two) times daily.     mometasone-formoterol 200-5 MCG/ACT Aero  Commonly known as:  DULERA  2 puffs then rinse mouth, twice daily     omeprazole 20 MG capsule  Commonly known as:  PRILOSEC  Take 20 mg by mouth 2 (two) times daily before a meal.     oxyCODONE-acetaminophen 5-325 MG per tablet  Commonly known as:  PERCOCET/ROXICET  Take 1 tablet by mouth every 8 (eight) hours as needed for severe pain.     predniSONE 10 MG tablet  Commonly known as:  DELTASONE  Take 1 tablet (10 mg total) by mouth daily with  breakfast.     zolpidem 10 MG tablet  Commonly known as:  AMBIEN  Take 10 mg by mouth at bedtime as needed for sleep.        Disposition and follow-up:   Ms.Jennamarie SOKHNA CHRISTOPH was discharged from [facility] in [place] condition.  At the hospital follow up visit please address:  1.  Please ensure pt complete steroid taper.   2.  Labs / imaging needed at time of follow-up: none  3.  Pending labs/ test needing follow-up: none  Follow-up Appointments:     Follow-up Information    Follow up with Osa Craver, MD On 04/04/2014.   Specialty:  Internal Medicine   Why:  at 8:45 am for hospital f/u   Contact information:   [place] [address] [phone]            Procedures Performed:  Dg Chest 2 View (if Patient Has Fever And/or Copd)  03/27/2014   CLINICAL DATA:  Wheezing, asthma  EXAM: CHEST  2 VIEW  COMPARISON:  11/15/2013  FINDINGS: Cardiomediastinal silhouette is unremarkable. No acute infiltrate or pulmonary edema. Central mild bronchitic changes. Bony thorax is unremarkable.  IMPRESSION: No acute infiltrate or pulmonary edema. Central mild bronchitic changes.   Electronically Signed  By: Lahoma Crocker M.D.   On: 03/27/2014 14:05    Admission HPI: Pt is a 43 y/o female w/ PMHx of HTN, OSA, asthma (on chronic steroids), and chronic back pain who presents to the ED w/ difficulty breathing that started yesterday. She states she started having difficulty breathing and took 2 tabs of prednisone 10mg  and her inhalers dulera and pro air. She then went to sleep and had to use her inhalers multiple times throughout the night. She then woke up with difficulty breathing and came to the ED. She use to smoke 8-10 cigarettes per day for 5 yrs and quit 8 years ago. Denies illicit drug use and does not drink EtOH. She has not been sick, recent exposure to cigarette smoke, denies sick contacts and pets, and has never required intubation of her asthma exacerbations.  Endorse dry cough. This is her 2nd asthma exacerbation this year and she normally has 2-3 per year. She believes the trigger for this exacerbation was attending a cookout this Friday that she had to leave b/c of all the smoke during the cookout. She sees. Dr. Tarri Fuller from [facility] pulmonology. She was last seen by him on 12/31/13 at which time her Ruthe Mannan was increased to 200 and she was started on maintenance prednisone 10mg  daily. He noted that pt has difficulty w/ medication non compliance.   She has not taken her BP med HCTZ in 1 week b/c she ran out of it. Denies hx of CHF or pedal edema. Endorses weight gain 2/2 to steroid use.   Hospital Course by problem list: Principal Problem:   Acute asthma exacerbation Active Problems:   Essential hypertension   Esophageal reflux   Obstructive sleep apnea   Insomnia   Asthma exacerbation   Acute asthma exacerbation-- Pt given nebulizers in the ED w/o improvement in her breathing. Also given IV solu-medrol 125mg  once in the ED. Satting at 99-100% room air during exam. Peak flow baseline 350 noted in 3/15, however peak flow at that time was 270 during clinic visit in 06/2013. Given duonebs q4h and and started on po prednisone 40mg  x 5 days (end date 12/4). The next day breathing significantly improved and pt was CTAB and pt was stable for discharge.   Discharge Vitals:   BP 153/89 mmHg  Pulse 76  Temp(Src) 98.4 F (36.9 C) (Oral)  Resp 23  Ht 5\' 6"  (1.676 m)  Wt 330 lb 0.5 oz (149.7 kg)  BMI 53.29 kg/m2  SpO2 100%  LMP 03/03/2014  Discharge Labs:  Results for orders placed or performed during the hospital encounter of 03/27/14 (from the past 24 hour(s))  CBC     (if pt has PMH of COPD)     Status: Abnormal   Collection Time: 03/27/14  1:22 PM  Result Value Ref Range   WBC 14.6 (H) 4.0 - 10.5 K/uL   RBC 4.60 3.87 - 5.11 MIL/uL   Hemoglobin 12.8 12.0 - 15.0 g/dL   HCT 39.3 36.0 - 46.0 %   MCV 85.4 78.0 - 100.0 fL   MCH 27.8 26.0 - 34.0 pg    MCHC 32.6 30.0 - 36.0 g/dL   RDW 15.7 (H) 11.5 - 15.5 %   Platelets 368 150 - 400 K/uL  Basic metabolic panel    (if pt has PMH of COPD)     Status: Abnormal   Collection Time: 03/27/14  1:22 PM  Result Value Ref Range   Sodium 137 137 - 147 mEq/L   Potassium  3.4 (L) 3.7 - 5.3 mEq/L   Chloride 101 96 - 112 mEq/L   CO2 22 19 - 32 mEq/L   Glucose, Bld 92 70 - 99 mg/dL   BUN 10 6 - 23 mg/dL   Creatinine, Ser 0.77 0.50 - 1.10 mg/dL   Calcium 8.4 8.4 - 10.5 mg/dL   GFR calc non Af Amer >90 >90 mL/min   GFR calc Af Amer >90 >90 mL/min   Anion gap 14 5 - 15  I-stat troponin, ED (if patient has history of COPD)     Status: None   Collection Time: 03/27/14  1:36 PM  Result Value Ref Range   Troponin i, poc 0.00 0.00 - 0.08 ng/mL   Comment 3          MRSA PCR Screening     Status: None   Collection Time: 03/27/14 10:38 PM  Result Value Ref Range   MRSA by PCR NEGATIVE NEGATIVE  CBC     Status: Abnormal   Collection Time: 03/28/14  2:45 AM  Result Value Ref Range   WBC 12.4 (H) 4.0 - 10.5 K/uL   RBC 4.42 3.87 - 5.11 MIL/uL   Hemoglobin 12.0 12.0 - 15.0 g/dL   HCT 37.7 36.0 - 46.0 %   MCV 85.3 78.0 - 100.0 fL   MCH 27.1 26.0 - 34.0 pg   MCHC 31.8 30.0 - 36.0 g/dL   RDW 15.6 (H) 11.5 - 15.5 %   Platelets 390 150 - 400 K/uL  Basic metabolic panel     Status: Abnormal   Collection Time: 03/28/14  2:45 AM  Result Value Ref Range   Sodium 137 137 - 147 mEq/L   Potassium 3.9 3.7 - 5.3 mEq/L   Chloride 102 96 - 112 mEq/L   CO2 21 19 - 32 mEq/L   Glucose, Bld 128 (H) 70 - 99 mg/dL   BUN 9 6 - 23 mg/dL   Creatinine, Ser 0.65 0.50 - 1.10 mg/dL   Calcium 8.5 8.4 - 10.5 mg/dL   GFR calc non Af Amer >90 >90 mL/min   GFR calc Af Amer >90 >90 mL/min   Anion gap 14 5 - 15    Signed: Julious Oka, MD 03/28/2014, 10:42 AM    Services Ordered on Discharge: none Equipment Ordered on Discharge: none

## 2014-03-28 NOTE — Progress Notes (Signed)
Discharge instructions reviewed with patient. Patient verbalized understanding of discharge instructions.

## 2014-03-29 ENCOUNTER — Telehealth: Payer: Self-pay | Admitting: Internal Medicine

## 2014-03-29 ENCOUNTER — Telehealth: Payer: Self-pay | Admitting: *Deleted

## 2014-03-29 NOTE — Telephone Encounter (Signed)
Please see what you can come up with

## 2014-03-29 NOTE — Telephone Encounter (Signed)
I spoke with patient-she will be here on Wednesday 03-30-14 at 9am for HFU with CY and then Xolair injections then. Nothing more needed at this time.

## 2014-03-29 NOTE — Telephone Encounter (Signed)
Pt called - went to ER recently due to wheezing. Has been unable to work - needs a note. Appt made 03/30/14 9:45AM Dr Marvel Plan.  Hilda Blades Praveen Coia RN 03/29/14 10AM

## 2014-03-29 NOTE — Telephone Encounter (Signed)
Dr. Annamaria Boots where can this pt be offerred an appt?  There are two 15-minute slots open for you on Monday and these are the only open appt times.  No Active Allergies Current Outpatient Prescriptions on File Prior to Visit  Medication Sig Dispense Refill  . albuterol (PROAIR HFA) 108 (90 BASE) MCG/ACT inhaler Inhale 2 puffs into the lungs every 4 (four) hours as needed for wheezing or shortness of breath. 8.5 g 2  . albuterol (PROVENTIL) (2.5 MG/3ML) 0.083% nebulizer solution Take 3 mLs (2.5 mg total) by nebulization every 4 (four) hours as needed for wheezing or shortness of breath. 30 vial 11  . cyclobenzaprine (FLEXERIL) 5 MG tablet Take 1 tablet (5 mg total) by mouth 3 (three) times daily as needed for muscle spasms. (Patient not taking: Reported on 03/27/2014) 30 tablet 0  . hydrochlorothiazide (MICROZIDE) 12.5 MG capsule Take 1 capsule (12.5 mg total) by mouth daily. 30 capsule 6  . ibuprofen (MOTRIN IB) 200 MG tablet Take 4 tablets (800 mg total) by mouth every 8 (eight) hours as needed for moderate pain. (Patient not taking: Reported on 03/27/2014) 30 tablet 1  . methocarbamol (ROBAXIN) 500 MG tablet Take 2 tablets (1,000 mg total) by mouth 2 (two) times daily. (Patient not taking: Reported on 03/27/2014) 20 tablet 0  . mometasone-formoterol (DULERA) 200-5 MCG/ACT AERO 2 puffs then rinse mouth, twice daily 1 Inhaler prn  . omeprazole (PRILOSEC) 20 MG capsule Take 20 mg by mouth 2 (two) times daily before a meal.    . oxyCODONE-acetaminophen (PERCOCET/ROXICET) 5-325 MG per tablet Take 1 tablet by mouth every 8 (eight) hours as needed for severe pain. 30 tablet 0  . predniSONE (DELTASONE) 10 MG tablet Take 1 tablet (10 mg total) by mouth daily with breakfast. 30 tablet 3  . predniSONE (DELTASONE) 20 MG tablet Take 2 tablets (40 mg total) by mouth daily with breakfast. 4 tablet 0  . zolpidem (AMBIEN) 10 MG tablet Take 10 mg by mouth at bedtime as needed for sleep.     No current  facility-administered medications on file prior to visit.

## 2014-03-30 ENCOUNTER — Encounter: Payer: Self-pay | Admitting: Internal Medicine

## 2014-03-30 ENCOUNTER — Encounter: Payer: Self-pay | Admitting: *Deleted

## 2014-03-30 ENCOUNTER — Ambulatory Visit: Payer: Self-pay | Admitting: Internal Medicine

## 2014-03-30 ENCOUNTER — Ambulatory Visit (INDEPENDENT_AMBULATORY_CARE_PROVIDER_SITE_OTHER): Payer: Self-pay | Admitting: Internal Medicine

## 2014-03-30 VITALS — BP 151/98 | HR 83 | Temp 98.0°F | Ht 66.0 in | Wt 333.1 lb

## 2014-03-30 VITALS — BP 128/78 | HR 80 | Ht 66.0 in | Wt 333.2 lb

## 2014-03-30 DIAGNOSIS — G4700 Insomnia, unspecified: Secondary | ICD-10-CM

## 2014-03-30 DIAGNOSIS — J45901 Unspecified asthma with (acute) exacerbation: Secondary | ICD-10-CM

## 2014-03-30 DIAGNOSIS — J4541 Moderate persistent asthma with (acute) exacerbation: Secondary | ICD-10-CM

## 2014-03-30 DIAGNOSIS — G4733 Obstructive sleep apnea (adult) (pediatric): Secondary | ICD-10-CM

## 2014-03-30 DIAGNOSIS — J4530 Mild persistent asthma, uncomplicated: Secondary | ICD-10-CM

## 2014-03-30 DIAGNOSIS — M546 Pain in thoracic spine: Secondary | ICD-10-CM

## 2014-03-30 DIAGNOSIS — I10 Essential (primary) hypertension: Secondary | ICD-10-CM

## 2014-03-30 DIAGNOSIS — J302 Other seasonal allergic rhinitis: Secondary | ICD-10-CM

## 2014-03-30 DIAGNOSIS — M545 Low back pain, unspecified: Secondary | ICD-10-CM

## 2014-03-30 MED ORDER — OMALIZUMAB 150 MG ~~LOC~~ SOLR
225.0000 mg | Freq: Once | SUBCUTANEOUS | Status: AC
  Administered 2014-03-30: 225 mg via SUBCUTANEOUS

## 2014-03-30 MED ORDER — FLUTICASONE FUROATE-VILANTEROL 100-25 MCG/INH IN AEPB: 1.0000 | INHALATION_SPRAY | Freq: Every day | RESPIRATORY_TRACT | Status: DC

## 2014-03-30 MED ORDER — OXYCODONE-ACETAMINOPHEN 5-325 MG PO TABS: 1.0000 | ORAL_TABLET | Freq: Three times a day (TID) | ORAL | Status: DC | PRN

## 2014-03-30 NOTE — Assessment & Plan Note (Addendum)
Assessment: patient was recently admitted to the hospital from 03/27/14 to 03/28/14 for an asthma exacerbation. Patient improved quickly with solumedrol and duoneb treatments. Patient was discharged on prednisone 40 mg daily for 5 days (ends 04/01/14) and instructed to resume home dose of prednisone 10 mg daily the following day after completion. However, patient never filled prescription for prednisone 40 mg daily. Patient saw her pulmonologist this morning, who instructed her to just continue her prednisone 10 mg daily at this point. Patient received flu shot and a new sample from her pulmonologist today, but is unsure what type of inhaler it is. She was instructed to continue her other medications at this time. Patient is much improved, but continues to have mild occasional wheezing and is not quite back at baseline.  Plan: -Resume home prednisone 10 mg daily (on 04/02/14) -Continue albuterol nebulizers and inhaler as needed -Continue Dulera 2 puffs BID

## 2014-03-30 NOTE — Patient Instructions (Addendum)
General Instructions:   Please bring your medicines with you each time you come to clinic.  Medicines may include prescription medications, over-the-counter medications, herbal remedies, eye drops, vitamins, or other pills.   Asthma Attack Prevention Although there is no way to prevent asthma from starting, you can take steps to control the disease and reduce its symptoms. Learn about your asthma and how to control it. Take an active role to control your asthma by working with your health care provider to create and follow an asthma action plan. An asthma action plan guides you in:  Taking your medicines properly.  Avoiding things that set off your asthma or make your asthma worse (asthma triggers).  Tracking your level of asthma control.  Responding to worsening asthma.  Seeking emergency care when needed. To track your asthma, keep records of your symptoms, check your peak flow number using a handheld device that shows how well air moves out of your lungs (peak flow meter), and get regular asthma checkups.  WHAT ARE SOME WAYS TO PREVENT AN ASTHMA ATTACK?  Take medicines as directed by your health care provider.  Keep track of your asthma symptoms and level of control.  With your health care provider, write a detailed plan for taking medicines and managing an asthma attack. Then be sure to follow your action plan. Asthma is an ongoing condition that needs regular monitoring and treatment.  Identify and avoid asthma triggers. Many outdoor allergens and irritants (such as pollen, mold, cold air, and air pollution) can trigger asthma attacks. Find out what your asthma triggers are and take steps to avoid them.  Monitor your breathing. Learn to recognize warning signs of an attack, such as coughing, wheezing, or shortness of breath. Your lung function may decrease before you notice any signs or symptoms, so regularly measure and record your peak airflow with a home peak flow  meter.  Identify and treat attacks early. If you act quickly, you are less likely to have a severe attack. You will also need less medicine to control your symptoms. When your peak flow measurements decrease and alert you to an upcoming attack, take your medicine as instructed and immediately stop any activity that may have triggered the attack. If your symptoms do not improve, get medical help.  Pay attention to increasing quick-relief inhaler use. If you find yourself relying on your quick-relief inhaler, your asthma is not under control. See your health care provider about adjusting your treatment. WHAT CAN MAKE MY SYMPTOMS WORSE? A number of common things can set off or make your asthma symptoms worse and cause temporary increased inflammation of your airways. Keep track of your asthma symptoms for several weeks, detailing all the environmental and emotional factors that are linked with your asthma. When you have an asthma attack, go back to your asthma diary to see which factor, or combination of factors, might have contributed to it. Once you know what these factors are, you can take steps to control many of them. If you have allergies and asthma, it is important to take asthma prevention steps at home. Minimizing contact with the substance to which you are allergic will help prevent an asthma attack. Some triggers and ways to avoid these triggers are: Animal Dander:  Some people are allergic to the flakes of skin or dried saliva from animals with fur or feathers.   There is no such thing as a hypoallergenic dog or cat breed. All dogs or cats can cause allergies, even if they  don't shed.  Keep these pets out of your home.  If you are not able to keep a pet outdoors, keep the pet out of your bedroom and other sleeping areas at all times, and keep the door closed.  Remove carpets and furniture covered with cloth from your home. If that is not possible, keep the pet away from fabric-covered  furniture and carpets. Dust Mites: Many people with asthma are allergic to dust mites. Dust mites are tiny bugs that are found in every home in mattresses, pillows, carpets, fabric-covered furniture, bedcovers, clothes, stuffed toys, and other fabric-covered items.   Cover your mattress in a special dust-proof cover.  Cover your pillow in a special dust-proof cover, or wash the pillow each week in hot water. Water must be hotter than 130 F (54.4 C) to kill dust mites. Cold or warm water used with detergent and bleach can also be effective.  Wash the sheets and blankets on your bed each week in hot water.  Try not to sleep or lie on cloth-covered cushions.  Call ahead when traveling and ask for a smoke-free hotel room. Bring your own bedding and pillows in case the hotel only supplies feather pillows and down comforters, which may contain dust mites and cause asthma symptoms.  Remove carpets from your bedroom and those laid on concrete, if you can.  Keep stuffed toys out of the bed, or wash the toys weekly in hot water or cooler water with detergent and bleach. Cockroaches: Many people with asthma are allergic to the droppings and remains of cockroaches.   Keep food and garbage in closed containers. Never leave food out.  Use poison baits, traps, powders, gels, or paste (for example, boric acid).  If a spray is used to kill cockroaches, stay out of the room until the odor goes away. Indoor Mold:  Fix leaky faucets, pipes, or other sources of water that have mold around them.  Clean floors and moldy surfaces with a fungicide or diluted bleach.  Avoid using humidifiers, vaporizers, or swamp coolers. These can spread molds through the air. Pollen and Outdoor Mold:  When pollen or mold spore counts are high, try to keep your windows closed.  Stay indoors with windows closed from late morning to afternoon. Pollen and some mold spore counts are highest at that time.  Ask your health  care provider whether you need to take anti-inflammatory medicine or increase your dose of the medicine before your allergy season starts. Other Irritants to Avoid:  Tobacco smoke is an irritant. If you smoke, ask your health care provider how you can quit. Ask family members to quit smoking, too. Do not allow smoking in your home or car.  If possible, do not use a wood-burning stove, kerosene heater, or fireplace. Minimize exposure to all sources of smoke, including incense, candles, fires, and fireworks.  Try to stay away from strong odors and sprays, such as perfume, talcum powder, hair spray, and paints.  Decrease humidity in your home and use an indoor air cleaning device. Reduce indoor humidity to below 60%. Dehumidifiers or central air conditioners can do this.  Decrease house dust exposure by changing furnace and air cooler filters frequently.  Try to have someone else vacuum for you once or twice a week. Stay out of rooms while they are being vacuumed and for a short while afterward.  If you vacuum, use a dust mask from a hardware store, a double-layered or microfilter vacuum cleaner bag, or a vacuum cleaner  with a HEPA filter.  Sulfites in foods and beverages can be irritants. Do not drink beer or wine or eat dried fruit, processed potatoes, or shrimp if they cause asthma symptoms.  Cold air can trigger an asthma attack. Cover your nose and mouth with a scarf on cold or windy days.  Several health conditions can make asthma more difficult to manage, including a runny nose, sinus infections, reflux disease, psychological stress, and sleep apnea. Work with your health care provider to manage these conditions.  Avoid close contact with people who have a respiratory infection such as a cold or the flu, since your asthma symptoms may get worse if you catch the infection. Wash your hands thoroughly after touching items that may have been handled by people with a respiratory  infection.  Get a flu shot every year to protect against the flu virus, which often makes asthma worse for days or weeks. Also get a pneumonia shot if you have not previously had one. Unlike the flu shot, the pneumonia shot does not need to be given yearly. Medicines:  Talk to your health care provider about whether it is safe for you to take aspirin or non-steroidal anti-inflammatory medicines (NSAIDs). In a small number of people with asthma, aspirin and NSAIDs can cause asthma attacks. These medicines must be avoided by people who have known aspirin-sensitive asthma. It is important that people with aspirin-sensitive asthma read labels of all over-the-counter medicines used to treat pain, colds, coughs, and fever.  Beta-blockers and ACE inhibitors are other medicines you should discuss with your health care provider. HOW CAN I FIND OUT WHAT I AM ALLERGIC TO? Ask your asthma health care provider about allergy skin testing or blood testing (the RAST test) to identify the allergens to which you are sensitive. If you are found to have allergies, the most important thing to do is to try to avoid exposure to any allergens that you are sensitive to as much as possible. Other treatments for allergies, such as medicines and allergy shots (immunotherapy) are available.  CAN I EXERCISE? Follow your health care provider's advice regarding asthma treatment before exercising. It is important to maintain a regular exercise program, but vigorous exercise or exercise in cold, humid, or dry environments can cause asthma attacks, especially for those people who have exercise-induced asthma. Document Released: 04/03/2009 Document Revised: 04/20/2013 Document Reviewed: 10/21/2012 [facility] Patient Information 2015 [place], [place]. This information is not intended to replace advice given to you by your health care provider. Make sure you discuss any questions you have with your health care provider.  Asthma Asthma is a  recurring condition in which the airways tighten and narrow. Asthma can make it difficult to breathe. It can cause coughing, wheezing, and shortness of breath. Asthma episodes, also called asthma attacks, range from minor to life-threatening. Asthma cannot be cured, but medicines and lifestyle changes can help control it. CAUSES Asthma is believed to be caused by inherited (genetic) and environmental factors, but its exact cause is unknown. Asthma may be triggered by allergens, lung infections, or irritants in the air. Asthma triggers are different for each person. Common triggers include:   Animal dander.  Dust mites.  Cockroaches.  Pollen from trees or grass.  Mold.  Smoke.  Air pollutants such as dust, household cleaners, hair sprays, aerosol sprays, paint fumes, strong chemicals, or strong odors.  Cold air, weather changes, and winds (which increase molds and pollens in the air).  Strong emotional expressions such as crying or  laughing hard.  Stress.  Certain medicines (such as aspirin) or types of drugs (such as beta-blockers).  Sulfites in foods and drinks. Foods and drinks that may contain sulfites include dried fruit, potato chips, and sparkling grape juice.  Infections or inflammatory conditions such as the flu, a cold, or an inflammation of the nasal membranes (rhinitis).  Gastroesophageal reflux disease (GERD).  Exercise or strenuous activity. SYMPTOMS Symptoms may occur immediately after asthma is triggered or many hours later. Symptoms include:  Wheezing.  Excessive nighttime or early morning coughing.  Frequent or severe coughing with a common cold.  Chest tightness.  Shortness of breath. DIAGNOSIS  The diagnosis of asthma is made by a review of your medical history and a physical exam. Tests may also be performed. These may include:  Lung function studies. These tests show how much air you breathe in and out.  Allergy tests.  Imaging tests such as  X-rays. TREATMENT  Asthma cannot be cured, but it can usually be controlled. Treatment involves identifying and avoiding your asthma triggers. It also involves medicines. There are 2 classes of medicine used for asthma treatment:   Controller medicines. These prevent asthma symptoms from occurring. They are usually taken every day.  Reliever or rescue medicines. These quickly relieve asthma symptoms. They are used as needed and provide short-term relief. Your health care provider will help you create an asthma action plan. An asthma action plan is a written plan for managing and treating your asthma attacks. It includes a list of your asthma triggers and how they may be avoided. It also includes information on when medicines should be taken and when their dosage should be changed. An action plan may also involve the use of a device called a peak flow meter. A peak flow meter measures how well the lungs are working. It helps you monitor your condition. HOME CARE INSTRUCTIONS   Take medicines only as directed by your health care provider. Speak with your health care provider if you have questions about how or when to take the medicines.  Use a peak flow meter as directed by your health care provider. Record and keep track of readings.  Understand and use the action plan to help minimize or stop an asthma attack without needing to seek medical care.  Control your home environment in the following ways to help prevent asthma attacks:  Do not smoke. Avoid being exposed to secondhand smoke.  Change your heating and air conditioning filter regularly.  Limit your use of fireplaces and wood stoves.  Get rid of pests (such as roaches and mice) and their droppings.  Throw away plants if you see mold on them.  Clean your floors and dust regularly. Use unscented cleaning products.  Try to have someone else vacuum for you regularly. Stay out of rooms while they are being vacuumed and for a short  while afterward. If you vacuum, use a dust mask from a hardware store, a double-layered or microfilter vacuum cleaner bag, or a vacuum cleaner with a HEPA filter.  Replace carpet with wood, tile, or vinyl flooring. Carpet can trap dander and dust.  Use allergy-proof pillows, mattress covers, and box spring covers.  Wash bed sheets and blankets every week in hot water and dry them in a dryer.  Use blankets that are made of polyester or cotton.  Clean bathrooms and kitchens with bleach. If possible, have someone repaint the walls in these rooms with mold-resistant paint. Keep out of the rooms that are  being cleaned and painted.  Wash hands frequently. SEEK MEDICAL CARE IF:   You have wheezing, shortness of breath, or a cough even if taking medicine to prevent attacks.  The colored mucus you cough up (sputum) is thicker than usual.  Your sputum changes from clear or white to yellow, green, gray, or bloody.  You have any problems that may be related to the medicines you are taking (such as a rash, itching, swelling, or trouble breathing).  You are using a reliever medicine more than 2-3 times per week.  Your peak flow is still at 50-79% of your personal best after following your action plan for 1 hour.  You have a fever. SEEK IMMEDIATE MEDICAL CARE IF:   You seem to be getting worse and are unresponsive to treatment during an asthma attack.  You are short of breath even at rest.  You get short of breath when doing very little physical activity.  You have difficulty eating, drinking, or talking due to asthma symptoms.  You develop chest pain.  You develop a fast heartbeat.  You have a bluish color to your lips or fingernails.  You are light-headed, dizzy, or faint.  Your peak flow is less than 50% of your personal best. MAKE SURE YOU:   Understand these instructions.  Will watch your condition.  Will get help right away if you are not doing well or get worse. Document  Released: 04/15/2005 Document Revised: 08/30/2013 Document Reviewed: 11/12/2012 [facility] Patient Information 2015 [place], [place]. This information is not intended to replace advice given to you by your health care provider. Make sure you discuss any questions you have with your health care provider.

## 2014-03-30 NOTE — Patient Instructions (Signed)
Continue Xolair  Continue prednisone 10 mg once daily  Continue your other meds as before  Including Dulera 200 2 puffs and rinse twice daily  Add Sample Breo Ellipta   1 puff inhaled and rinse mouth, once daily  Try otc diphenhydramine/ Benadryl  25- 50 mg for sleep as needed. Try taking it      1/2 hour before sleep, so it has time to wear off and not leave you too sleepy      when you get up.

## 2014-03-30 NOTE — Progress Notes (Signed)
Subjective:     Patient ID: April [name], female   DOB: 10-04-1970, 43 y.o.   MRN: [id number]  HPI April [name] is a 43 yo female with PMHx of Asthma, HTN, OSA, and GERD who presents to the clinic for follow up. Please see problem oriented charting for more information.  Review of Systems General: Denies fever, chills, fatigue, change in appetite and diaphoresis.  Respiratory: Admits to wheezing. Denies SOB, cough, DOE, chest tightness.   Cardiovascular: Denies chest pain and palpitations.  Gastrointestinal: Denies nausea, vomiting, abdominal pain, diarrhea, constipation, blood in stool and abdominal distention.  Genitourinary: Denies dysuria, urgency, frequency, hematuria, suprapubic pain and flank pain. Endocrine: Denies hot or cold intolerance, polyuria, and polydipsia. Musculoskeletal: Admits to myalgias, back pain, joint swelling, arthralgias and gait problem.  Skin: Denies pallor, rash and wounds.  Neurological: Denies dizziness, headaches, weakness, lightheadedness, numbness,seizures, and syncope, Psychiatric/Behavioral: Denies mood changes, confusion, nervousness, sleep disturbance and agitation.  Past Medical History  Diagnosis Date  . Acanthosis nigricans   . Menorrhagia   . Hypertension, essential   . Insomnia   . Morbid obesity   . Helicobacter pylori (H. pylori) infection   . Sleep apnea     Sleep study 2008 - mild OSA, not enough events to titrate CPAP  . COPD (chronic obstructive pulmonary disease)     PFTs in 2002, FEV1/FVC 65, no post bronchodilater test done  . Asthma     Followed by Dr. Annamaria Boots (pulmonology); receives every other week omalizumab injections; has frequent exacerbations  . Depression   . GERD (gastroesophageal reflux disease)   . Tobacco user   . Obesity   . Shortness of breath   . Headache(784.0)      Medication List       This list is accurate as of: 03/30/14  1:47 PM.  Always use your most recent med list.               albuterol (2.5  MG/3ML) 0.083% nebulizer solution  Commonly known as:  PROVENTIL  Take 3 mLs (2.5 mg total) by nebulization every 4 (four) hours as needed for wheezing or shortness of breath.     albuterol 108 (90 BASE) MCG/ACT inhaler  Commonly known as:  PROAIR HFA  Inhale 2 puffs into the lungs every 4 (four) hours as needed for wheezing or shortness of breath.     cyclobenzaprine 5 MG tablet  Commonly known as:  FLEXERIL  Take 1 tablet (5 mg total) by mouth 3 (three) times daily as needed for muscle spasms.     Fluticasone Furoate-Vilanterol 100-25 MCG/INH Aepb  Commonly known as:  BREO ELLIPTA  Inhale 1 puff into the lungs daily. Rinse mouth after each use.     hydrochlorothiazide 12.5 MG capsule  Commonly known as:  MICROZIDE  Take 1 capsule (12.5 mg total) by mouth daily.     ibuprofen 200 MG tablet  Commonly known as:  MOTRIN IB  Take 4 tablets (800 mg total) by mouth every 8 (eight) hours as needed for moderate pain.     methocarbamol 500 MG tablet  Commonly known as:  ROBAXIN  Take 2 tablets (1,000 mg total) by mouth 2 (two) times daily.     mometasone-formoterol 200-5 MCG/ACT Aero  Commonly known as:  DULERA  2 puffs then rinse mouth, twice daily     omeprazole 20 MG capsule  Commonly known as:  PRILOSEC  Take 20 mg by mouth 2 (two) times daily before a meal.  oxyCODONE-acetaminophen 5-325 MG per tablet  Commonly known as:  PERCOCET/ROXICET  Take 1 tablet by mouth every 8 (eight) hours as needed for severe pain.     predniSONE 10 MG tablet  Commonly known as:  DELTASONE  Take 1 tablet (10 mg total) by mouth daily with breakfast.     predniSONE 20 MG tablet  Commonly known as:  DELTASONE  Take 2 tablets (40 mg total) by mouth daily with breakfast.     zolpidem 10 MG tablet  Commonly known as:  AMBIEN  Take 10 mg by mouth at bedtime as needed for sleep.           Objective:   Physical Exam Filed Vitals:   03/30/14 1345  BP: 151/98  Pulse: 83  Temp: 98 F  (36.7 C)  TempSrc: Oral  Height: 5\' 6"  (1.676 m)  Weight: 333 lb 1.6 oz (151.093 kg)  SpO2: 100%   General: Vital signs reviewed.  Patient is well-developed and well-nourished, in no acute distress and cooperative with exam.  Head: Normocephalic and atraumatic. Eyes: EOMI, conjunctivae normal, no scleral icterus.  Neck: Supple, trachea midline, normal ROM, no JVD, masses, thyromegaly, or carotid bruit present.  Cardiovascular: RRR, S1 normal, S2 normal, no murmurs, gallops, or rubs. Pulmonary/Chest: Clear to auscultation bilaterally, no wheezes, rales, or rhonchi. Abdominal: Soft, non-tender, non-distended, BS +, no masses, organomegaly, or guarding present.  Musculoskeletal: No joint deformities, erythema, or stiffness, ROM full and nontender. Skin: Warm, dry and intact. No rashes or erythema. Psychiatric: Normal mood and affect. speech and behavior is normal. Cognition and memory are normal.      Assessment:         Plan:     Please see problem oriented assessment and plan.

## 2014-03-30 NOTE — Assessment & Plan Note (Signed)
BP Readings from Last 3 Encounters:  03/30/14 151/98  03/30/14 128/78  03/28/14 161/95    Lab Results  Component Value Date   NA 137 03/28/2014   K 3.9 03/28/2014   CREATININE 0.65 03/28/2014    Assessment: Blood pressure control:   Not at goal Progress toward BP goal:   Deteriorate Comments: BP elevated to stressful conversation prior to clinic. BP at pulm appointment earlier today 128/78.   Plan: Medications:  continue current medications Educational resources provided:   Self management tools provided:   Other plans: Likely elevated due to stress

## 2014-03-30 NOTE — Assessment & Plan Note (Signed)
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 Patient understand she cannot be on opioids for an extended period of time and is willing to see PT/Sports Medicine 05/13/14 when her insurance kicks in. She has seen them previously with relief of her symptoms.   Plan: -Refill Percocet 5-325 Q8H #60 no refill -Flexeril 5 mg TID -Ibuprofen prn

## 2014-03-30 NOTE — Progress Notes (Signed)
Patient ID: April [name], female    DOB: [birth year], 43 y.o.   MRN: [id number]  HPI 66 yoF former smoker followed for chronic severe asthma. Hospitalized 4/11-12/12 for another exacerbation she says was triggered by catching a cold. Common triggers include colds and any smoke exposure. Back now to baseline. She has continued Xolair injection here every 2 weeks and does feel it has reduced frequency of flare-ups. Usual weight range 320-340 and she asks why weight is up to 360. Discussed steroids, including IV therapy at hospital but also ?role of Dulera 200-5. She is now being seen a [facility] anticipating breast reduction surgery and we talked about bariatric surgery. Takes omeprazole twice daily before meals, but has frequent heart burn.   04/12/11- 47 yoF former smoker followed for chronic severe asthma. Has had flu vaccine and pertussis vaccine. Says her breathing is okay. We had sent antibiotic for a cold with bronchitis around Thanksgiving. She denies any smoking at all now. She has avoided hospital partly because she has been better and partly because she is unwilling to go. Xolair has made a real improvement. She had breast reduction surgery without problems at [facility] and has considered bariatric surgery but can't afford it. Uses her nebulizer machine but not every day. Does continue Dulera and omeprazole. She is taking Ambien but still sleeps poorly at night. She no longer misses much work but that means she can't nap in the daytime. Complains of frequently waking at night but does not know if she snores.  04/25/11-  67 yoF former smoker followed for chronic severe asthma complicated by morbid obesity, HBP, depression. Her antidepressant was changed to Wellbutrin on December 18. Since then she can't sleep, using 3 Ambien per night. Increased asthma with wheezing. She increased to [facility] to 6 puffs daily. Had a cold at Thanksgiving but not since. She continues Xolair.  08/06/11- 24 yoF former  smoker followed for chronic severe asthma complicated by morbid obesity, HBP, depression, GERD. Change jobs and insurance. Had a lapse of 2 weeks in her Xolair therapy but ready to restart. Work environment is clear and cool, indoors. Watery rhinorrhea not helped by 3 intermittent use of Flonase. No acute asthma attack but uses her Dulera and rescue inhaler every night at bedtime. Ambien helps sleep.  10/02/12- 26 yoF former smoker followed for chronic severe asthma complicated by morbid obesity, HBP, depression, GERD. FOLLOWS FOR: had a few hosptial visits recently due to insurance and PA for April [name] to have medication on Tuesday 10-06-12 and can resume Xolair then. Continues to have trouble sleeping (falling and staying) and would like to go back on Ambien if possible. Throat itch x several days, dry cough. ER again 2 weeks ago. Off Xolair x 5 weeks but has insurance ok now to restart. She started some left over prednisone. Aware occasional reflux- discussed precautions. CXR 05/22/12 IMPRESSION:  No acute infiltrate or pulmonary edema. Mild perihilar  increased bronchial markings without focal consolidation.  Original Report Authenticated By: Lahoma Crocker, M.D.   11/09/2012 [facility] follow up  Recent admitted for Acute asthma flare. Tx w/ abx , steroids and nebs. Reports doing well since discharge.  finished prednisone and abx from hosp but currently on Cipro for UTI.  no new complaints; reports breathing is back to baseline.   Had restarted smoking but has quit . We discussed med compliance. No fever, chest pain, orthopnea, hemoptysis.  No edema.  11/09/2012 CXR Stable, with suspected artifactual density in the medial  right  upper lung related to summation. No acute cardiopulmonary  Abnormality.  01/15/13- 76 yoF former smoker followed for chronic severe asthma complicated by morbid obesity, HBP, depression, GERD. FOLLOWS FOR: patient is not working but has insurance-medications costly and  unable to afford at times. Had been getting Xolair shots regularly, but has had to skip while insurance is worked out. Has had flu vaccine. New problem-insomnia. Difficulty initiating and maintaining sleep not adequately helped by clonazepam. Irregular bedtime. Gets up between 4 and 5 AM. Previously used Ambien but blames it for hallucination and was using too much. Sleep study ordered 2012 but she cannot get it done CXR 12/22/12 IMPRESSION:  No acute cardiopulmonary disease. Specifically, no evidence of  pneumonia.  Original Report Authenticated By: [name], MD  04/08/13- 55 yoF former smoker followed for chronic severe asthma/ Xolair complicated by OSA/ CPAP,  morbid obesity, HBP, depression, GERD FOLLOWS FOR: still on Xolair; continues to wheeze alot. Feels like since taking clonazepam she wakes up with the feeling of not being able to breath. She describes waking wheezing and short of breath after taking clonazepam. It sounds as if she just sleeps deeply. Ambien caused hallucination when she was taking 2 or 3 at a time. She says she was "given" her CPAP machine at the hospital but with no followup instructions and no explanation as to how to use it.  NPSG 03/31/07- AHI 5.8/ hr, weight 330 lbs. She canceled planned more recent study.  08/02/13- 60 yoF former smoker followed for chronic severe asthma/ Xolair complicated by OSA/ CPAP,  morbid obesity, HBP, depression, GERD ACUTE VISIT: had Xolair today; having increased nasal congestion, wheezing and SOB,headaches-using nebulizers more than usual. Just ran out of Dulera. Visit on prednisone 20 mg daily ear he taking omeprazole only when needed. Aware of reflux at night. Plain spring pollen for increased nasal congestion. 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 CXR 3/21/5 IMPRESSION:  Mild chronic bronchitic changes.  No acute abnormalities.  Electronically Signed  By: Lavonia Dana M.D.  On: 07/17/2013 19:02  12/30/13- 76 yoF former  smoker followed for chronic severe asthma/ Xolair complicated by OSA/ CPAP,  morbid obesity, HBP, depression, GERD FOLLOWS FOR: Pt states she has good and bad days. Pt states she has increase SOB in morning when waking up. Pt c/o mild dry cough. Pt denies CP/tightness.  Has Dulera 100, not quite enough help. No rescue inhaler.Has nebulizer. Wants to avoid ER, but frequent trips. Wants to avoid prednisone- bursts drive weight up. None in 2 days. Discussed low maintenance to stabilize. Off Xolair since divorce stopped her health insurance. Assistance program being explored.  Works third shift.. Significant difficulty initiating and maintaining sleep. OTC diphenhydramine not helpful. CXR 11/23/13 IMPRESSION:  No active cardiopulmonary disease.  Electronically Signed  By: Dereck Ligas M.D.  On: 11/23/2013 12:06   03/30/14- 22 yoF former smoker followed for chronic severe asthma/ Xolair complicated by OSA/ CPAP,  morbid obesity, HBP, depression, GERD FOLLOWS FOR: Post hospital-was kept overnight 11-29 through 11-30 for asthma exacerbation; pt also wants to have her Xolair injection today. Continues to wheeze and not back at baseline. Asthma had been getting worse for 2 days before she went to ER. Still some wheeze and "not back to baseline. 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 Still feels tight with scant white sputum. Taking prednisone 10 mg daily. Throat itches and she coughs at night while working. Continues Xolair  but lack of transportation causes occasional missed dose. Chronic insomnia-Ambien is too expensive. Hoping to avoid increased steroid dose because of difficulty with weight control. CXR 03/27/14 IMPRESSION: No acute infiltrate or pulmonary edema. Central mild bronchitic changes. Electronically Signed  By: Lahoma Crocker M.D.  On: 03/27/2014 14:05  Review of Systems-See HPI Constitutional:   No-   weight loss, night sweats,  fevers, chills, +fatigue, lassitude. HEENT:   No-  headaches, difficulty swallowing, tooth/dental problems, sore throat,       + sneezing, itching, ear ache, +nasal congestion, post nasal drip,  CV:  No-   chest pain, orthopnea, PND, swelling in lower extremities, anasarca, dizziness, palpitations Resp: +shortness of breath with exertion or at rest.             + productive cough,  + non-productive cough,  No- coughing up of blood.              No-   change in color of mucus. +wheezing.   Skin: No-   rash or lesions. GI:  No-   heartburn, indigestion, abdominal pain, nausea, vomiting,  GU: MS:  No-   joint pain or swelling.   Neuro-     nothing unusual Psych:  No- change in mood or affect. No depression or anxiety.  No memory loss.    Objective:   Physical Exam General- Alert, Oriented, Affect-appropriate, Distress- none acute, + morbid                       obesity,  Skin- rash-none, lesions- none, excoriation- none Lymphadenopathy- none Head- atraumatic            Eyes- Gross vision intact, PERRLA, conjunctivae clear secretions            Ears- Hearing, canals-normal            Nose- Clear, no-Septal dev, mucus, polyps, erosion, perforation             Throat- Mallampati II , mucosa clear , drainage- none, tonsils- atrophic,           Neck- flexible , trachea midline, no stridor , thyroid nl, carotid no bruit Chest - symmetrical excursion , unlabored           Heart/CV- RRR , no murmur , no gallop  , no rub, nl s1 s2                           - JVD- none , edema- none, stasis changes- none, varices- none           Lung- Diminished/ clear now, unlabored.  Wheeze-none, cough-none,                             dullness-none,  rub- none           Chest wall-  Abd-  Br/ Gen/ Rectal- Not done, not indicated Extrem- cyanosis- none, clubbing, none, atrophy- none, strength- nl Neuro- grossly intact to observation

## 2014-03-31 NOTE — Assessment & Plan Note (Signed)
Working third shift contributes to disturbed sleep rhythm. Education done. Ambien helpful but too expensive now. Plan-try diphenhydramine as discussed

## 2014-03-31 NOTE — Assessment & Plan Note (Signed)
Fair control after pollen season

## 2014-03-31 NOTE — Assessment & Plan Note (Signed)
No success at weight loss

## 2014-03-31 NOTE — Assessment & Plan Note (Signed)
Typical recent admission. She had been getting worse for a few days without adjusting her management. Often out of medications. Xolair does help but she misses injections because of transportation. Plan-sample Brio 200 with goal of stabilization after exacerbation

## 2014-04-04 ENCOUNTER — Ambulatory Visit: Payer: Self-pay | Admitting: Internal Medicine

## 2014-04-05 ENCOUNTER — Encounter: Payer: Self-pay | Admitting: Internal Medicine

## 2014-04-05 NOTE — Addendum Note (Signed)
Addended by: Vickii Chafe on: 04/05/2014 02:02 PM   Modules accepted: Level of Service

## 2014-04-05 NOTE — Progress Notes (Signed)
INTERNAL MEDICINE TEACHING ATTENDING ADDENDUM - Aldine Contes, MD: I personally saw and evaluated April [name] in this clinic visit in conjunction with the resident, Dr. Marvel Plan. I have discussed patient's plan of care with medical resident during this visit. I have confirmed the physical exam findings and have read and agree with the clinic note including the plan with the following addition: - pt is here for hospital follow up for asthma exacerbation - c/w home prednisone, dulera and nebs  - BP mildly elevated. Will f/u on next visit but recent BP at pulm office was 128/78

## 2014-04-05 NOTE — Addendum Note (Signed)
Addended by: Vickii Chafe on: 04/05/2014 01:50 PM   Modules accepted: Level of Service

## 2014-04-13 ENCOUNTER — Ambulatory Visit: Payer: Self-pay

## 2014-04-15 ENCOUNTER — Telehealth: Payer: Self-pay | Admitting: Internal Medicine

## 2014-04-15 ENCOUNTER — Ambulatory Visit (INDEPENDENT_AMBULATORY_CARE_PROVIDER_SITE_OTHER): Payer: Self-pay

## 2014-04-15 DIAGNOSIS — J4520 Mild intermittent asthma, uncomplicated: Secondary | ICD-10-CM

## 2014-04-15 NOTE — Telephone Encounter (Signed)
Spoke with the pt  She was given Breo samples at last ov 03/30/14 and feels like this has helped her breathing a lot  She is afraid that it will be too expensive, and is asking for a couple more samples  Please advise if this is okay thanks!

## 2014-04-15 NOTE — Telephone Encounter (Signed)
Called spoke with pt. Pt assistance forms left for pick up. Nothing further needed

## 2014-04-15 NOTE — Telephone Encounter (Signed)
We don't get samples to maintain her on. Is there any assistance?

## 2014-04-18 IMAGING — CR DG CHEST 2V
2 series · 2 of 2 positions shown · non-contrast
Comparison: 02/15/2013.

CLINICAL DATA: Asthma.  Shortness of breath.

EXAM:
CHEST  2 VIEW

[w chest pa]
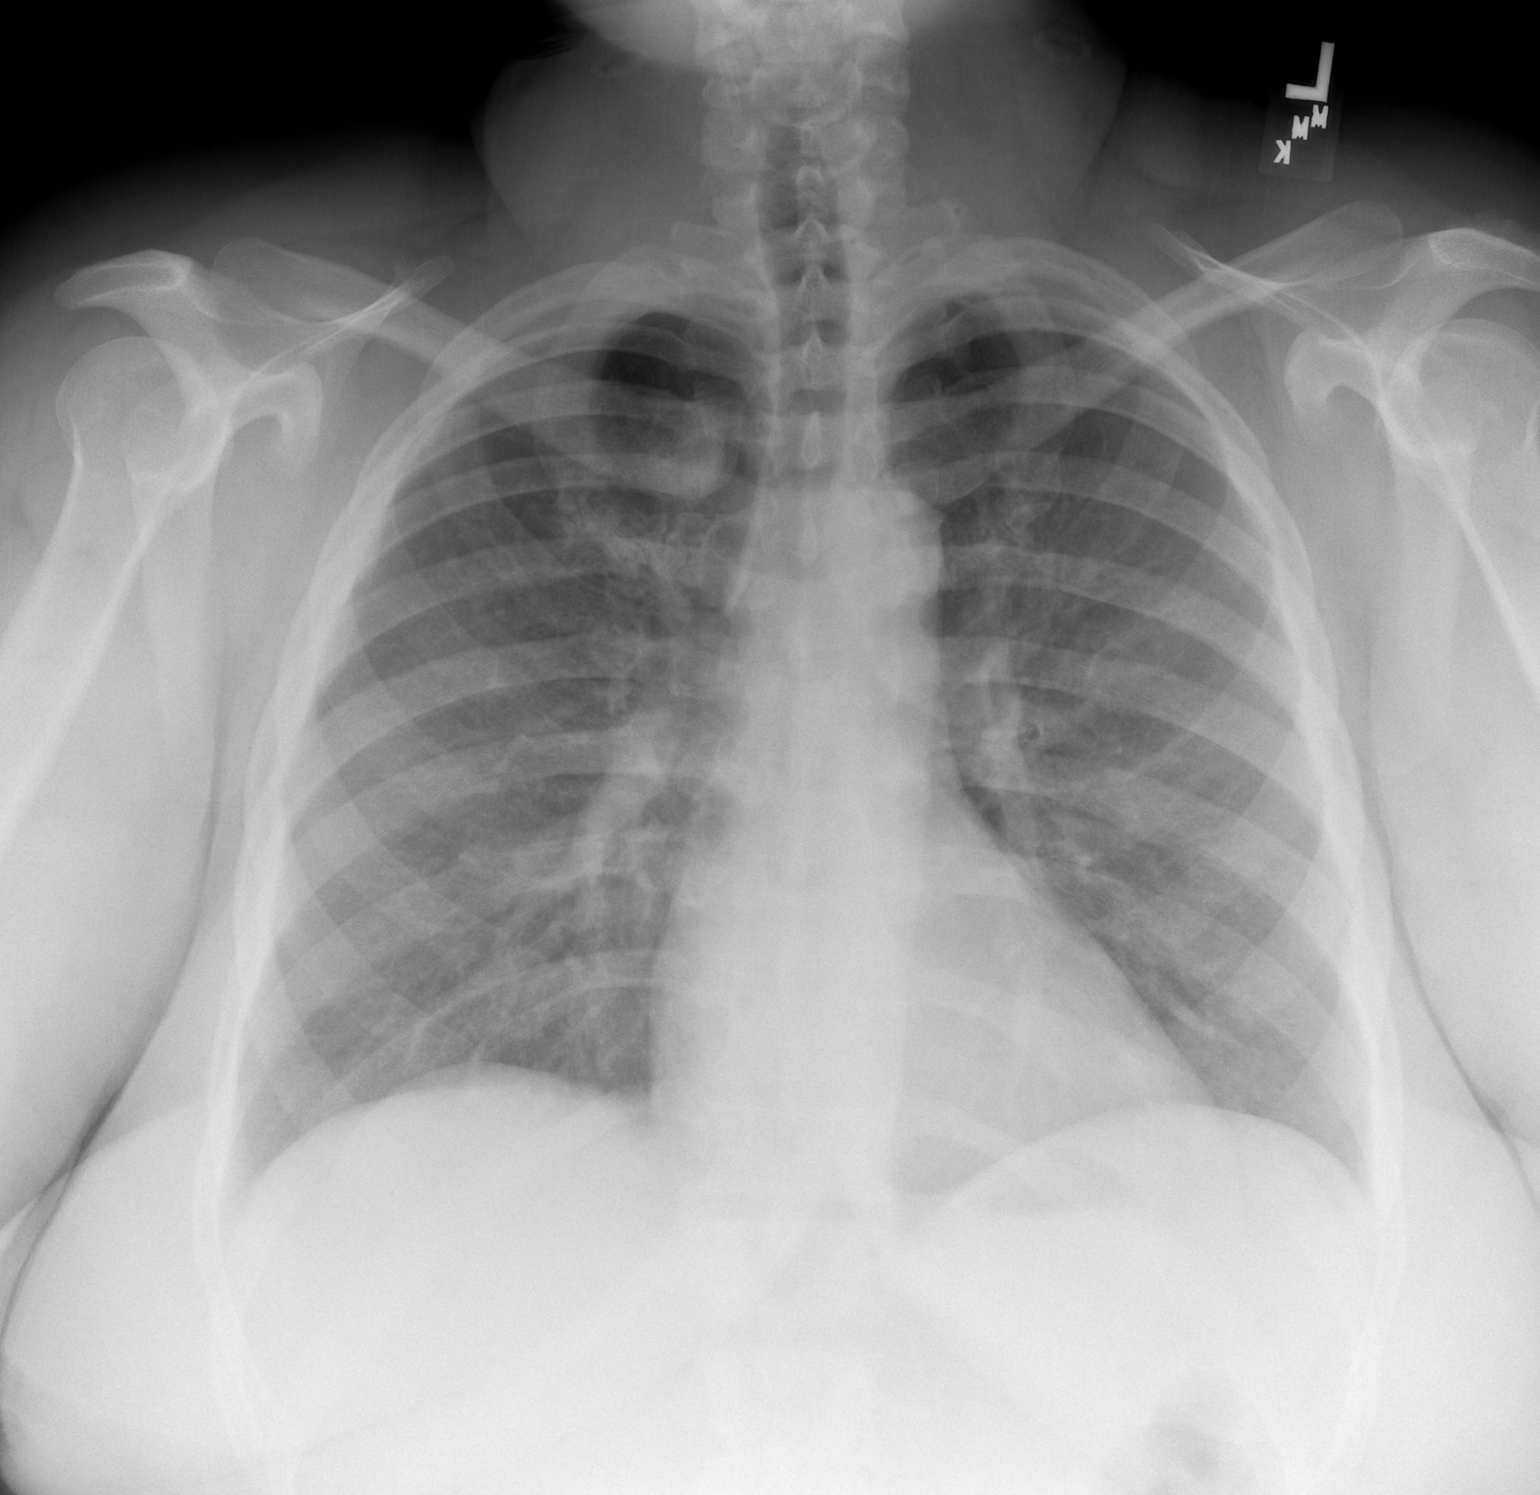

[w chest lat]
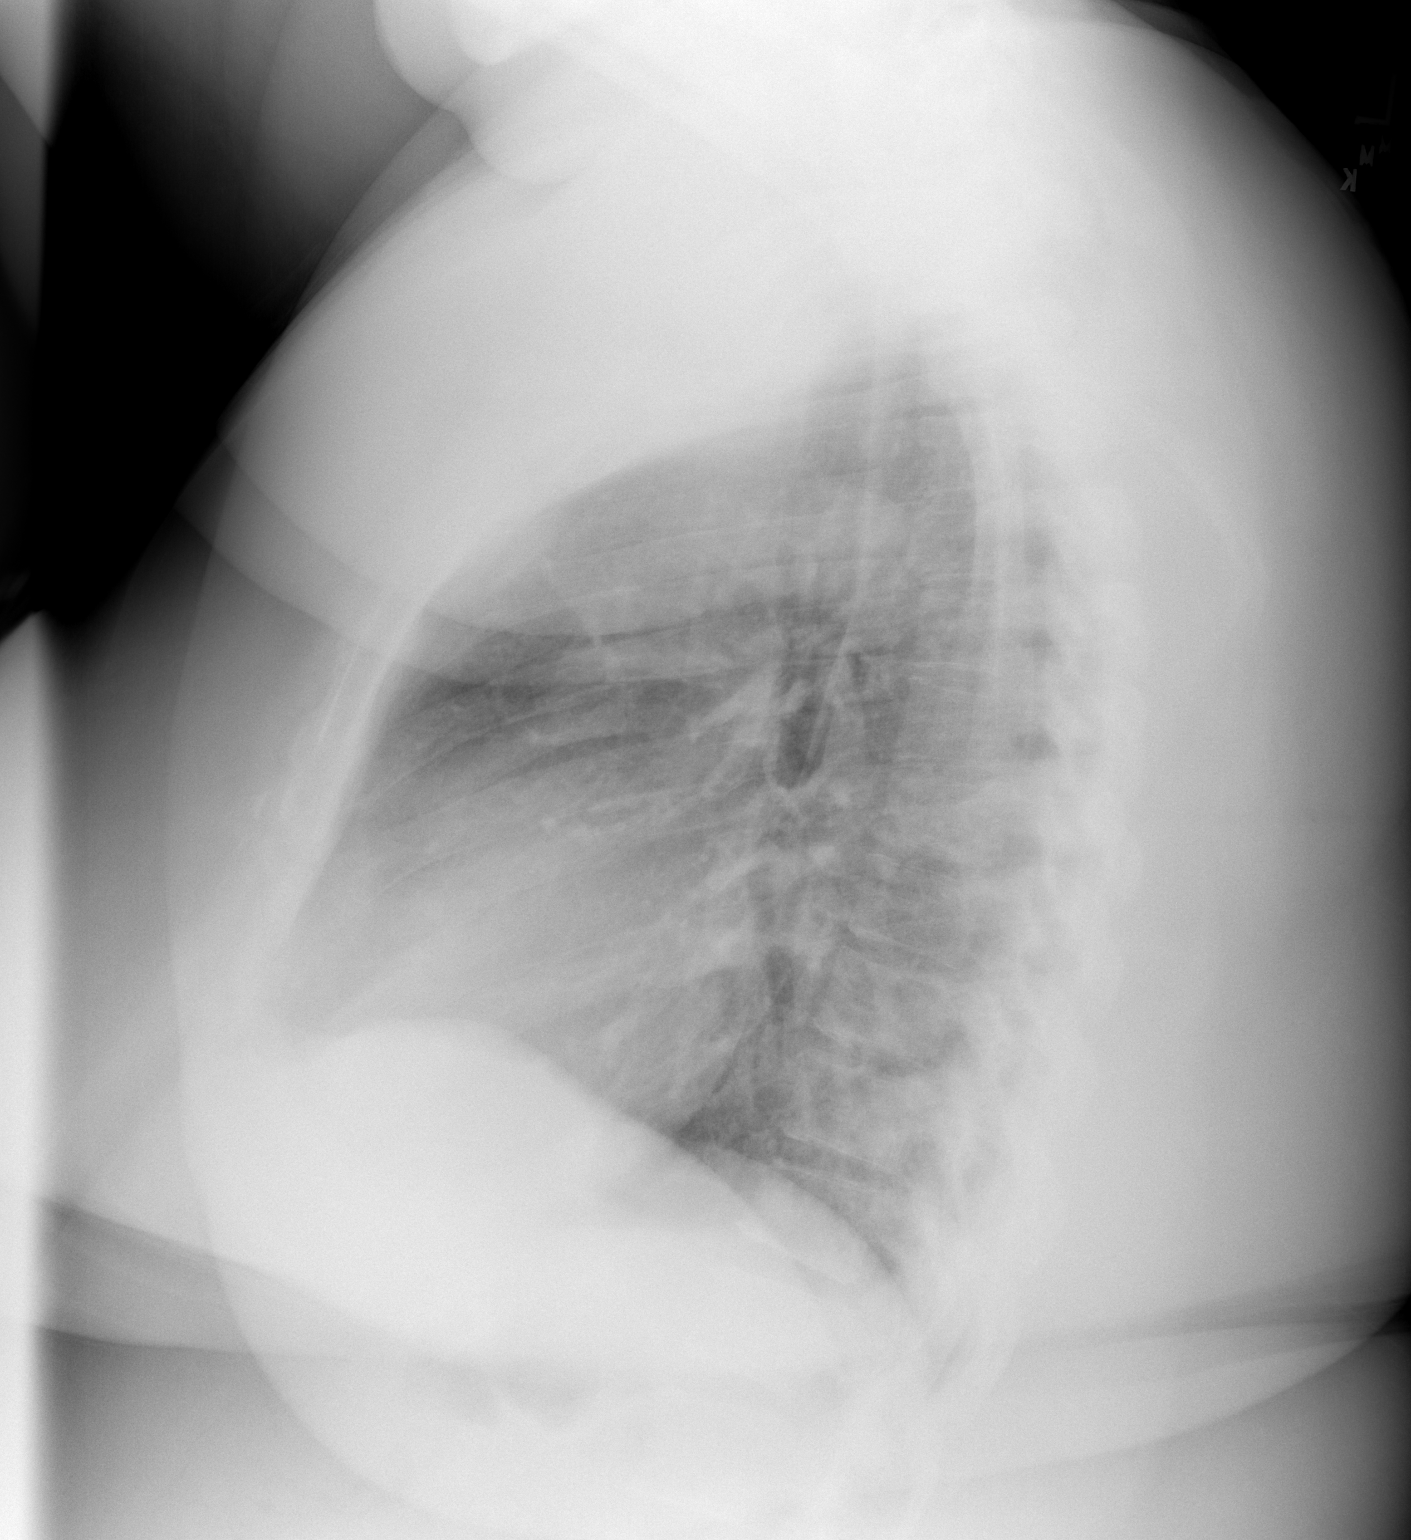

[2 of 2 positions shown; findings below may reference images not displayed]

FINDINGS: The heart size and mediastinal contours are within normal limits.
Both lungs are clear. The visualized skeletal structures are
unremarkable.
IMPRESSION: No active cardiopulmonary disease.

## 2014-04-18 MED ORDER — OMALIZUMAB 150 MG ~~LOC~~ SOLR
225.0000 mg | Freq: Once | SUBCUTANEOUS | Status: AC
  Administered 2014-04-15: 225 mg via SUBCUTANEOUS

## 2014-05-01 IMAGING — CR DG CHEST 2V
2 series · 2 of 2 positions shown · non-contrast
Comparison: 04/02/2013

CLINICAL DATA: Cough.  Shortness of breath.  Asthma.

EXAM:
CHEST  2 VIEW

[w chest pa]
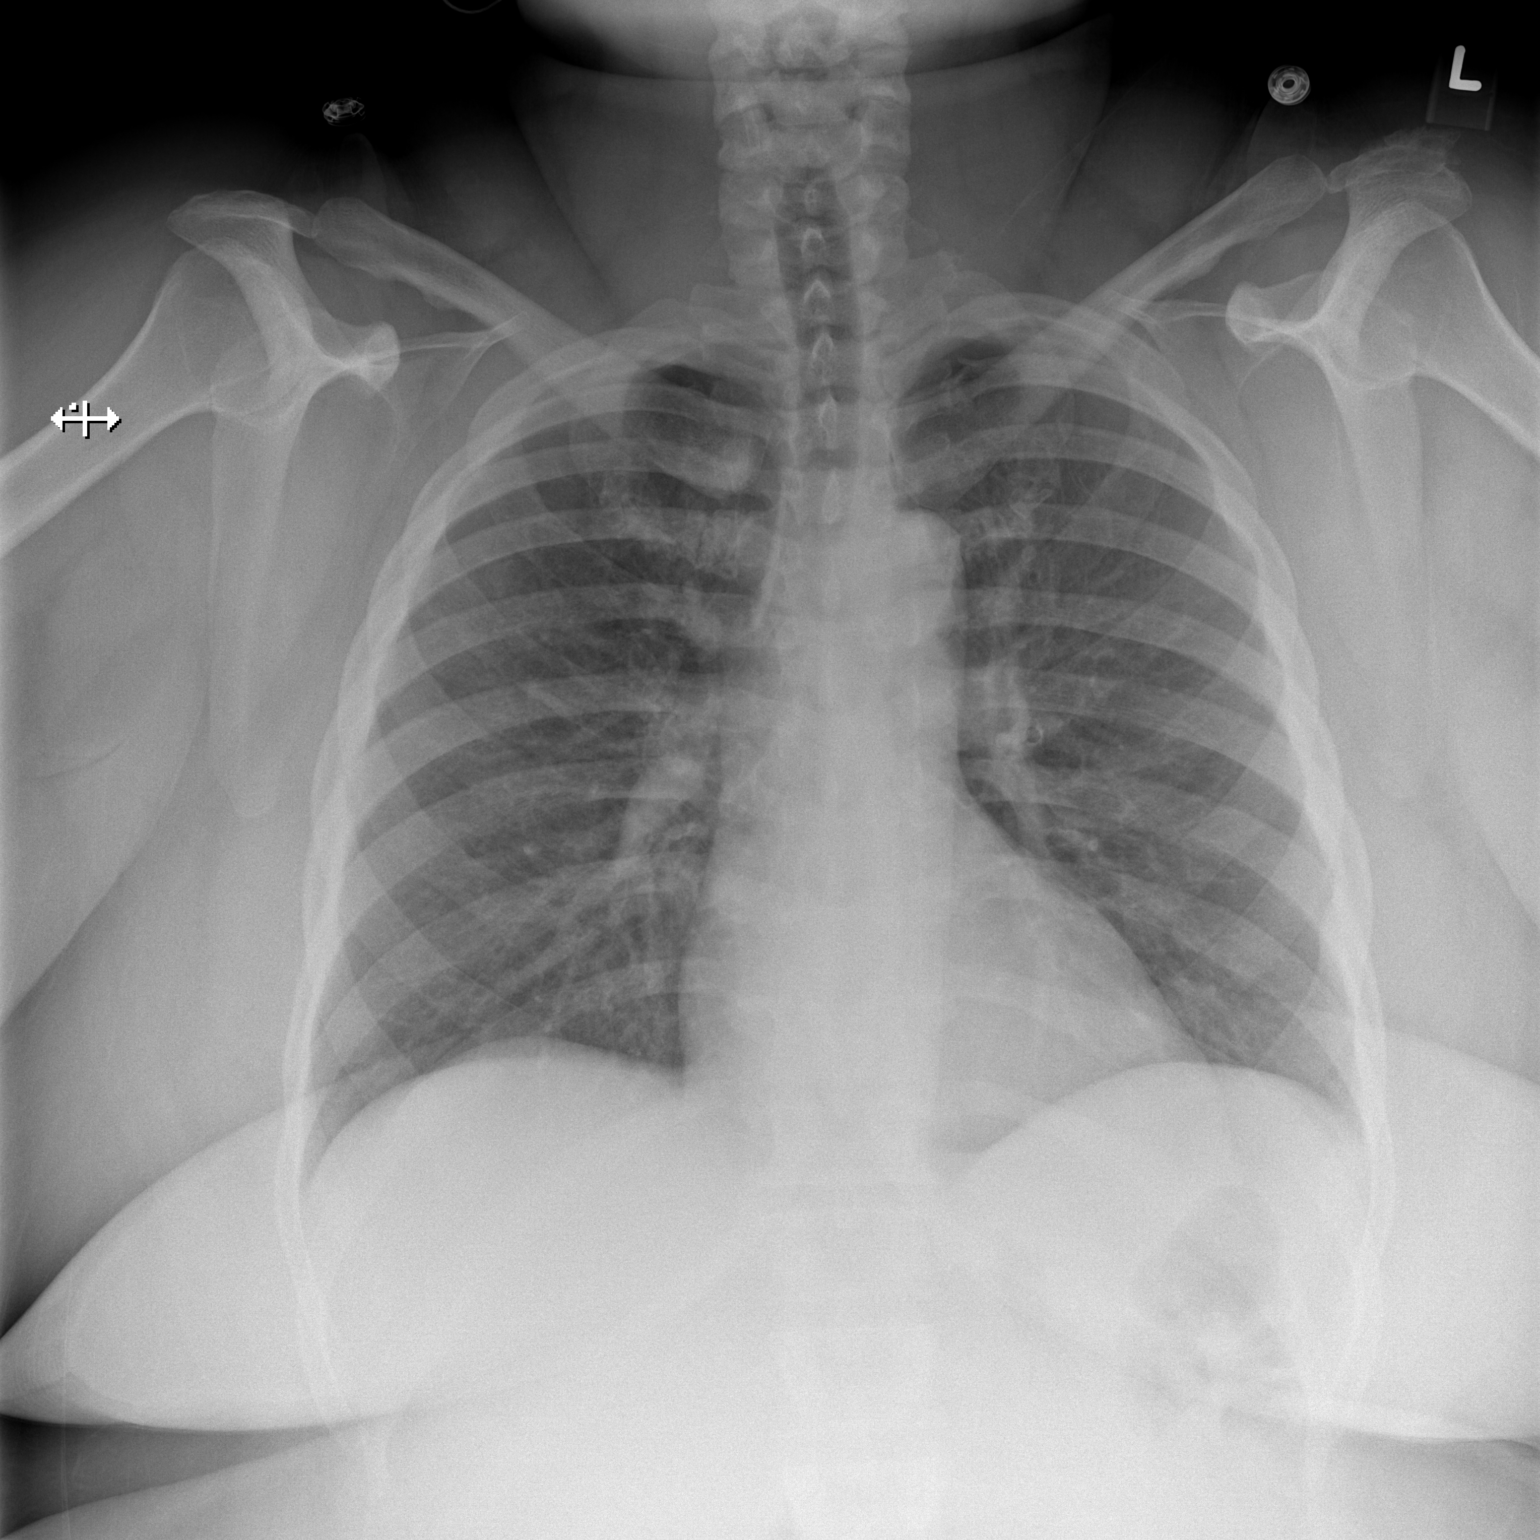

[w chest lat]
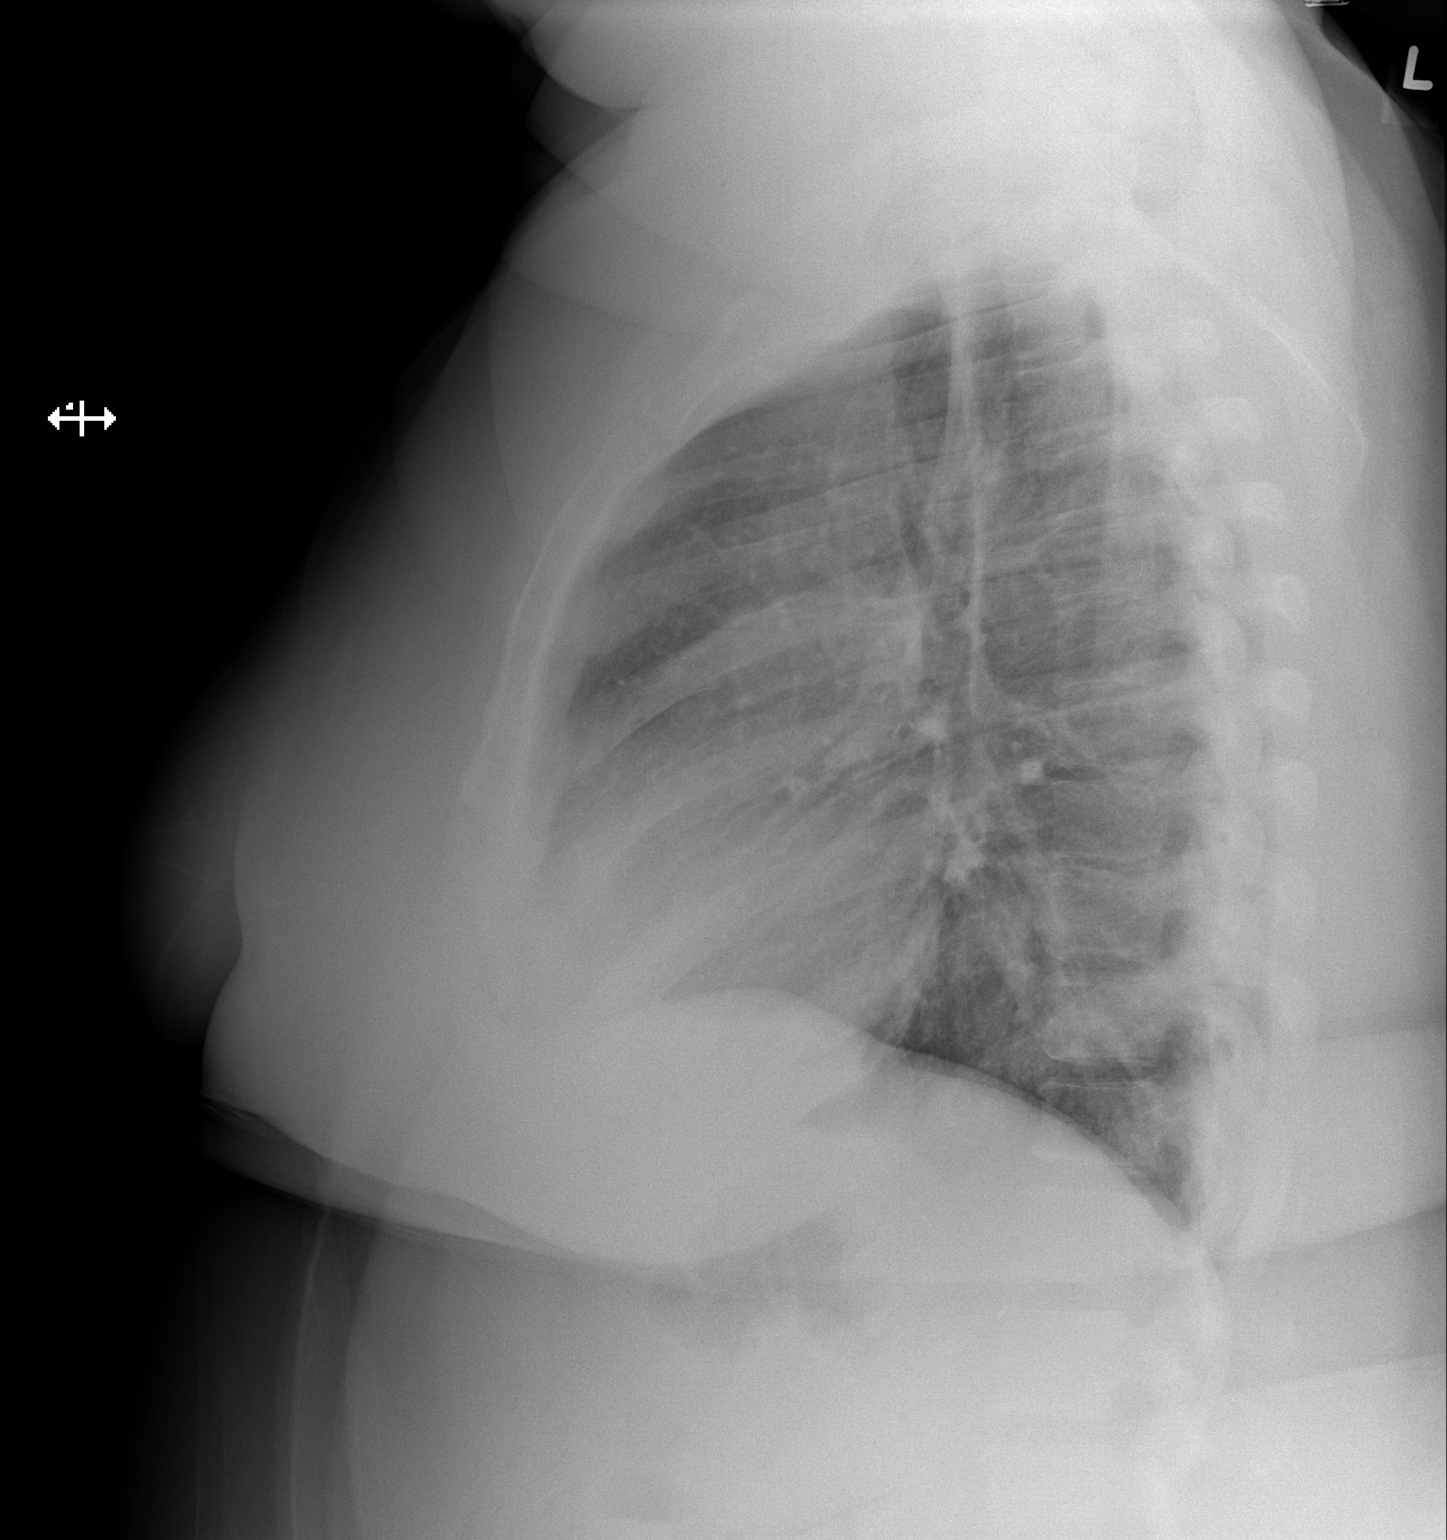

[2 of 2 positions shown; findings below may reference images not displayed]

FINDINGS: The heart size and mediastinal contours are within normal limits.
Both lungs are clear. The visualized skeletal structures are
unremarkable.
IMPRESSION: No active cardiopulmonary disease.

## 2014-05-02 ENCOUNTER — Ambulatory Visit (INDEPENDENT_AMBULATORY_CARE_PROVIDER_SITE_OTHER): Payer: Self-pay

## 2014-05-02 DIAGNOSIS — J4520 Mild intermittent asthma, uncomplicated: Secondary | ICD-10-CM

## 2014-05-04 MED ORDER — OMALIZUMAB 150 MG ~~LOC~~ SOLR
225.0000 mg | Freq: Once | SUBCUTANEOUS | Status: AC
  Administered 2014-05-02: 225 mg via SUBCUTANEOUS

## 2014-05-09 ENCOUNTER — Encounter (HOSPITAL_COMMUNITY): Payer: Self-pay | Admitting: Emergency Medicine

## 2014-05-09 ENCOUNTER — Emergency Department (HOSPITAL_COMMUNITY)
Admission: EM | Admit: 2014-05-09 | Discharge: 2014-05-09 | Disposition: A | Discharge status: Home / Self Care | Payer: Self-pay | Attending: Emergency Medicine | Admitting: Emergency Medicine

## 2014-05-09 DIAGNOSIS — M545 Low back pain, unspecified: Secondary | ICD-10-CM

## 2014-05-09 DIAGNOSIS — Z872 Personal history of diseases of the skin and subcutaneous tissue: Secondary | ICD-10-CM | POA: Insufficient documentation

## 2014-05-09 DIAGNOSIS — G4730 Sleep apnea, unspecified: Secondary | ICD-10-CM | POA: Insufficient documentation

## 2014-05-09 DIAGNOSIS — J441 Chronic obstructive pulmonary disease with (acute) exacerbation: Secondary | ICD-10-CM | POA: Insufficient documentation

## 2014-05-09 DIAGNOSIS — K219 Gastro-esophageal reflux disease without esophagitis: Secondary | ICD-10-CM | POA: Insufficient documentation

## 2014-05-09 DIAGNOSIS — Z9981 Dependence on supplemental oxygen: Secondary | ICD-10-CM | POA: Insufficient documentation

## 2014-05-09 DIAGNOSIS — Z8742 Personal history of other diseases of the female genital tract: Secondary | ICD-10-CM | POA: Insufficient documentation

## 2014-05-09 DIAGNOSIS — Z79899 Other long term (current) drug therapy: Secondary | ICD-10-CM | POA: Insufficient documentation

## 2014-05-09 DIAGNOSIS — Z8659 Personal history of other mental and behavioral disorders: Secondary | ICD-10-CM | POA: Insufficient documentation

## 2014-05-09 DIAGNOSIS — G4700 Insomnia, unspecified: Secondary | ICD-10-CM | POA: Insufficient documentation

## 2014-05-09 DIAGNOSIS — Z87891 Personal history of nicotine dependence: Secondary | ICD-10-CM | POA: Insufficient documentation

## 2014-05-09 DIAGNOSIS — Z8619 Personal history of other infectious and parasitic diseases: Secondary | ICD-10-CM | POA: Insufficient documentation

## 2014-05-09 DIAGNOSIS — J4520 Mild intermittent asthma, uncomplicated: Secondary | ICD-10-CM

## 2014-05-09 DIAGNOSIS — I10 Essential (primary) hypertension: Secondary | ICD-10-CM | POA: Insufficient documentation

## 2014-05-09 LAB — BASIC METABOLIC PANEL
Anion gap: 6 (ref 5–15)
BUN: 9 mg/dL (ref 6–23)
CALCIUM: 8.3 mg/dL — AB (ref 8.4–10.5)
CHLORIDE: 103 meq/L (ref 96–112)
CO2: 29 mmol/L (ref 19–32)
CREATININE: 0.8 mg/dL (ref 0.50–1.10)
GFR calc Af Amer: >90 mL/min (ref >90)
GFR calc non Af Amer: >90 mL/min (ref >90)
Glucose, Bld: 102 mg/dL — ABNORMAL HIGH (ref 70–99)
POTASSIUM: 2.9 mmol/L — AB (ref 3.5–5.1)
SODIUM: 138 mmol/L (ref 135–145)

## 2014-05-09 LAB — CBC
HCT: 37.9 % (ref 36.0–46.0)
Hemoglobin: 12.1 g/dL (ref 12.0–15.0)
MCH: 27.8 pg (ref 26.0–34.0)
MCHC: 31.9 g/dL (ref 30.0–36.0)
MCV: 86.9 fL (ref 78.0–100.0)
Platelets: 387 10*3/uL (ref 150–400)
RBC: 4.36 MIL/uL (ref 3.87–5.11)
RDW: 15.4 % (ref 11.5–15.5)
WBC: 14 10*3/uL — AB (ref 4.0–10.5)

## 2014-05-09 LAB — I-STAT TROPONIN, ED: Troponin i, poc: 0 ng/mL (ref 0.00–0.08)

## 2014-05-09 MED ORDER — PREDNISONE 10 MG PO TABS: 20.0000 mg | ORAL_TABLET | Freq: Every day | ORAL | Status: DC

## 2014-05-09 MED ORDER — IPRATROPIUM BROMIDE 0.02 % IN SOLN
0.5000 mg | Freq: Once | RESPIRATORY_TRACT | Status: AC
  Administered 2014-05-09: 0.5 mg via RESPIRATORY_TRACT
  Filled 2014-05-09: qty 2.5

## 2014-05-09 MED ORDER — PREDNISONE 20 MG PO TABS
60.0000 mg | ORAL_TABLET | Freq: Once | ORAL | Status: AC
  Administered 2014-05-09: 60 mg via ORAL
  Filled 2014-05-09: qty 3

## 2014-05-09 MED ORDER — POTASSIUM CHLORIDE CRYS ER 20 MEQ PO TBCR
40.0000 meq | EXTENDED_RELEASE_TABLET | Freq: Once | ORAL | Status: AC
  Administered 2014-05-09: 40 meq via ORAL
  Filled 2014-05-09: qty 2

## 2014-05-09 MED ORDER — IPRATROPIUM-ALBUTEROL 0.5-2.5 (3) MG/3ML IN SOLN
3.0000 mL | Freq: Once | RESPIRATORY_TRACT | Status: AC
  Administered 2014-05-09: 3 mL via RESPIRATORY_TRACT
  Filled 2014-05-09: qty 3

## 2014-05-09 MED ORDER — ALBUTEROL SULFATE HFA 108 (90 BASE) MCG/ACT IN AERS
2.0000 | INHALATION_SPRAY | RESPIRATORY_TRACT | Status: DC | PRN
  Administered 2014-05-09: 2 via RESPIRATORY_TRACT
  Filled 2014-05-09: qty 6.7

## 2014-05-09 MED ORDER — OXYCODONE-ACETAMINOPHEN 5-325 MG PO TABS
2.0000 | ORAL_TABLET | Freq: Once | ORAL | Status: AC
  Administered 2014-05-09: 2 via ORAL
  Filled 2014-05-09: qty 2

## 2014-05-09 MED ORDER — OXYCODONE-ACETAMINOPHEN 5-325 MG PO TABS
1.0000 | ORAL_TABLET | Freq: Three times a day (TID) | ORAL | Status: DC | PRN
Start: 2014-05-09 — End: 2014-07-02

## 2014-05-09 MED ORDER — ALBUTEROL (5 MG/ML) CONTINUOUS INHALATION SOLN
10.0000 mg/h | INHALATION_SOLUTION | Freq: Once | RESPIRATORY_TRACT | Status: AC
  Administered 2014-05-09: 10 mg/h via RESPIRATORY_TRACT
  Filled 2014-05-09: qty 20

## 2014-05-09 NOTE — Discharge Instructions (Signed)
Asthma Asthma is a recurring condition in which the airways tighten and narrow. Asthma can make it difficult to breathe. It can cause coughing, wheezing, and shortness of breath. Asthma episodes, also called asthma attacks, range from minor to life-threatening. Asthma cannot be cured, but medicines and lifestyle changes can help control it. CAUSES Asthma is believed to be caused by inherited (genetic) and environmental factors, but its exact cause is unknown. Asthma may be triggered by allergens, lung infections, or irritants in the air. Asthma triggers are different for each person. Common triggers include:   Animal dander.  Dust mites.  Cockroaches.  Pollen from trees or grass.  Mold.  Smoke.  Air pollutants such as dust, household cleaners, hair sprays, aerosol sprays, paint fumes, strong chemicals, or strong odors.  Cold air, weather changes, and winds (which increase molds and pollens in the air).  Strong emotional expressions such as crying or laughing hard.  Stress.  Certain medicines (such as aspirin) or types of drugs (such as beta-blockers).  Sulfites in foods and drinks. Foods and drinks that may contain sulfites include dried fruit, potato chips, and sparkling grape juice.  Infections or inflammatory conditions such as the flu, a cold, or an inflammation of the nasal membranes (rhinitis).  Gastroesophageal reflux disease (GERD).  Exercise or strenuous activity. SYMPTOMS Symptoms may occur immediately after asthma is triggered or many hours later. Symptoms include:  Wheezing.  Excessive nighttime or early morning coughing.  Frequent or severe coughing with a common cold.  Chest tightness.  Shortness of breath. DIAGNOSIS  The diagnosis of asthma is made by a review of your medical history and a physical exam. Tests may also be performed. These may include:  Lung function studies. These tests show how much air you breathe in and out.  Allergy  tests.  Imaging tests such as X-rays. TREATMENT  Asthma cannot be cured, but it can usually be controlled. Treatment involves identifying and avoiding your asthma triggers. It also involves medicines. There are 2 classes of medicine used for asthma treatment:   Controller medicines. These prevent asthma symptoms from occurring. They are usually taken every day.  Reliever or rescue medicines. These quickly relieve asthma symptoms. They are used as needed and provide short-term relief. Your health care provider will help you create an asthma action plan. An asthma action plan is a written plan for managing and treating your asthma attacks. It includes a list of your asthma triggers and how they may be avoided. It also includes information on when medicines should be taken and when their dosage should be changed. An action plan may also involve the use of a device called a peak flow meter. A peak flow meter measures how well the lungs are working. It helps you monitor your condition. HOME CARE INSTRUCTIONS   Take medicines only as directed by your health care provider. Speak with your health care provider if you have questions about how or when to take the medicines.  Use a peak flow meter as directed by your health care provider. Record and keep track of readings.  Understand and use the action plan to help minimize or stop an asthma attack without needing to seek medical care.  Control your home environment in the following ways to help prevent asthma attacks:  Do not smoke. Avoid being exposed to secondhand smoke.  Change your heating and air conditioning filter regularly.  Limit your use of fireplaces and wood stoves.  Get rid of pests (such as roaches and   mice) and their droppings.  Throw away plants if you see mold on them.  Clean your floors and dust regularly. Use unscented cleaning products.  Try to have someone else vacuum for you regularly. Stay out of rooms while they are  being vacuumed and for a short while afterward. If you vacuum, use a dust mask from a hardware store, a double-layered or microfilter vacuum cleaner bag, or a vacuum cleaner with a HEPA filter.  Replace carpet with wood, tile, or vinyl flooring. Carpet can trap dander and dust.  Use allergy-proof pillows, mattress covers, and box spring covers.  Wash bed sheets and blankets every week in hot water and dry them in a dryer.  Use blankets that are made of polyester or cotton.  Clean bathrooms and kitchens with bleach. If possible, have someone repaint the walls in these rooms with mold-resistant paint. Keep out of the rooms that are being cleaned and painted.  Wash hands frequently. SEEK MEDICAL CARE IF:   You have wheezing, shortness of breath, or a cough even if taking medicine to prevent attacks.  The colored mucus you cough up (sputum) is thicker than usual.  Your sputum changes from clear or white to yellow, green, gray, or bloody.  You have any problems that may be related to the medicines you are taking (such as a rash, itching, swelling, or trouble breathing).  You are using a reliever medicine more than 2-3 times per week.  Your peak flow is still at 50-79% of your personal best after following your action plan for 1 hour.  You have a fever. SEEK IMMEDIATE MEDICAL CARE IF:   You seem to be getting worse and are unresponsive to treatment during an asthma attack.  You are short of breath even at rest.  You get short of breath when doing very little physical activity.  You have difficulty eating, drinking, or talking due to asthma symptoms.  You develop chest pain.  You develop a fast heartbeat.  You have a bluish color to your lips or fingernails.  You are light-headed, dizzy, or faint.  Your peak flow is less than 50% of your personal best. MAKE SURE YOU:   Understand these instructions.  Will watch your condition.  Will get help right away if you are not  doing well or get worse. Document Released: 04/15/2005 Document Revised: 08/30/2013 Document Reviewed: 11/12/2012 Exit[employer] Patient Information 2015 [employer]. This information is not intended to replace advice given to you by your health care provider. Make sure you discuss any questions you have with your health care provider.  

## 2014-05-09 NOTE — ED Notes (Signed)
PT ambulated with baseline gait; VSS; A&Ox3; no signs of distress; respirations even and unlabored; skin warm and dry; no questions upon discharge.  

## 2014-05-09 NOTE — ED Provider Notes (Addendum)
CSN: [id number]     Arrival date & time 05/09/14  0503 History   First MD Initiated Contact with Patient 05/09/14 0531     Chief Complaint  Patient presents with  . Asthma  . Back Pain     (Consider location/radiation/quality/duration/timing/severity/associated sxs/prior Treatment) HPI Comments: Patient here complaining of dyspnea similar to her prior asthma exacerbation. Patient is on chronic prednisone at 10 mg a day but ran out of her medications and has not had her prednisone for the past 2 days. Has used her home nebulizer with temporary relief. No fever or productive cough. No pleuritic chest pain. Patient well-known to me and has been hospitalized in the past for asthma flares. No vomiting or diarrhea. His persistent and nothing makes them worse.  Patient is a 44 y.o. female presenting with asthma and back pain. The history is provided by the patient.  Asthma  Back Pain   Past Medical History  Diagnosis Date  . Acanthosis nigricans   . Menorrhagia   . Hypertension, essential   . Insomnia   . Morbid obesity   . Helicobacter pylori (H. pylori) infection   . Sleep apnea     Sleep study 2008 - mild OSA, not enough events to titrate CPAP  . COPD (chronic obstructive pulmonary disease)     PFTs in 2002, FEV1/FVC 65, no post bronchodilater test done  . Asthma     Followed by Dr. Annamaria Boots (pulmonology); receives every other week omalizumab injections; has frequent exacerbations  . Depression   . GERD (gastroesophageal reflux disease)   . Tobacco user   . Obesity   . Shortness of breath   . IFOYDXAJ(287.8)    Past Surgical History  Procedure Laterality Date  . Tubal ligation  1996    bilateral  . Breast reduction surgery  09/2011   Family History  Problem Relation Age of Onset  . Asthma Daughter   . Cancer Paternal Aunt   . Asthma Maternal Grandmother   . Hypertension Mother    History  Substance Use Topics  . Smoking status: Former Smoker -- 0.25 packs/day for 18  years    Types: Cigarettes    Quit date: 10/04/2012  . Smokeless tobacco: Never Used  . Alcohol Use: No   OB History    No data available     Review of Systems  Musculoskeletal: Positive for back pain.  All other systems reviewed and are negative.     Allergies  Review of patient's allergies indicates no known allergies.  Home Medications   Prior to Admission medications   Medication Sig Start Date End Date Taking? Authorizing Provider  albuterol (PROAIR HFA) 108 (90 BASE) MCG/ACT inhaler Inhale 2 puffs into the lungs every 4 (four) hours as needed for wheezing or shortness of breath. 09/15/13   Axel Filler, MD  albuterol (PROVENTIL) (2.5 MG/3ML) 0.083% nebulizer solution Take 3 mLs (2.5 mg total) by nebulization every 4 (four) hours as needed for wheezing or shortness of breath. 06/09/13   Pollie Friar, MD  cyclobenzaprine (FLEXERIL) 5 MG tablet Take 1 tablet (5 mg total) by mouth 3 (three) times daily as needed for muscle spasms. 01/27/14   Billy Fischer, MD  Fluticasone Furoate-Vilanterol (BREO ELLIPTA) 100-25 MCG/INH AEPB Inhale 1 puff into the lungs daily. Rinse mouth after each use. 03/30/14   Deneise Lever, MD  hydrochlorothiazide (MICROZIDE) 12.5 MG capsule Take 1 capsule (12.5 mg total) by mouth daily. 03/28/14 03/28/15  Julious Oka, MD  ibuprofen (MOTRIN IB) 200 MG tablet Take 4 tablets (800 mg total) by mouth every 8 (eight) hours as needed for moderate pain. 12/08/13   Albin Felling, MD  methocarbamol (ROBAXIN) 500 MG tablet Take 2 tablets (1,000 mg total) by mouth 2 (two) times daily. 01/17/14   Britt Bottom, NP  mometasone-formoterol [facility]) 200-5 MCG/ACT AERO 2 puffs then rinse mouth, twice daily 12/31/13   Deneise Lever, MD  omeprazole (PRILOSEC) 20 MG capsule Take 20 mg by mouth 2 (two) times daily before a meal.    Historical Provider, MD  oxyCODONE-acetaminophen (PERCOCET/ROXICET) 5-325 MG per tablet Take 1-2 tablets by mouth every 8 (eight) hours as  needed for severe pain. 03/30/14   Osa Craver, MD  predniSONE (DELTASONE) 10 MG tablet Take 1 tablet (10 mg total) by mouth daily with breakfast. 01/27/14   Deneise Lever, MD  predniSONE (DELTASONE) 20 MG tablet Take 2 tablets (40 mg total) by mouth daily with breakfast. Patient not taking: Reported on 03/30/2014 03/28/14   Julious Oka, MD  zolpidem (AMBIEN) 10 MG tablet Take 10 mg by mouth at bedtime as needed for sleep.    Historical Provider, MD   BP 142/95 mmHg  Pulse 90  Temp(Src) 97.9 F (36.6 C) (Oral)  Resp 21  Ht 5\' 6"  (1.676 m)  Wt 340 lb (154.223 kg)  BMI 54.90 kg/m2  SpO2 100%  LMP 05/06/2014 (Exact Date) Physical Exam  Constitutional: She is oriented to person, place, and time. She appears well-developed and well-nourished.  Non-toxic appearance. No distress.  HENT:  Head: Normocephalic and atraumatic.  Eyes: Conjunctivae, EOM and lids are normal. Pupils are equal, round, and reactive to light.  Neck: Normal range of motion. Neck supple. No tracheal deviation present. No thyroid mass present.  Cardiovascular: Normal rate, regular rhythm and normal heart sounds.  Exam reveals no gallop.   No murmur heard. Pulmonary/Chest: Effort normal. No stridor. No respiratory distress. She has decreased breath sounds. She has wheezes. She has no rhonchi. She has no rales.  Abdominal: Soft. Normal appearance and bowel sounds are normal. She exhibits no distension. There is no tenderness. There is no rebound and no CVA tenderness.  Musculoskeletal: Normal range of motion. She exhibits no edema or tenderness.  Neurological: She is alert and oriented to person, place, and time. She has normal strength. No cranial nerve deficit or sensory deficit. GCS eye subscore is 4. GCS verbal subscore is 5. GCS motor subscore is 6.  Skin: Skin is warm and dry. No abrasion and no rash noted.  Psychiatric: She has a normal mood and affect. Her speech is normal and behavior is normal.  Nursing note  and vitals reviewed.   ED Course  Procedures (including critical care time) Labs Review Labs Reviewed  CBC - Abnormal; Notable for the following:    WBC 14.0 (*)    All other components within normal limits  BASIC METABOLIC PANEL  I-STAT TROPOININ, ED    Imaging Review No results found.   EKG Interpretation None      MDM   Final diagnoses:  None   Patient given albuterol with Atrovent along with prednisone and wheezing continues. Will give continuous albuterol treatment and monitor and sign out to next provider     Leota Jacobsen, MD 05/09/14 [place], MD 05/09/14 [phone]

## 2014-05-09 NOTE — ED Provider Notes (Signed)
Patient feeling better.  Wants to go home.  Was discharged with a hand-held albuterol inhaler.  Dot Lanes, MD 05/09/14 [phone]

## 2014-05-09 NOTE — ED Notes (Signed)
Pt arrives from home with c/o asthma flare up, states she was using her rescue inhaler too much and stated she knew she needed to come in. Chronic low back pain, 10/10.

## 2014-05-09 NOTE — ED Notes (Signed)
Per Zenia Resides, MD hold continuous neb at this time.

## 2014-05-09 NOTE — ED Notes (Signed)
MD advised pt requesting pain meds for chronic back pain.

## 2014-05-13 ENCOUNTER — Telehealth: Payer: Self-pay | Admitting: Internal Medicine

## 2014-05-13 MED ORDER — PREDNISONE 10 MG PO TABS: 10.0000 mg | ORAL_TABLET | Freq: Every day | ORAL | Status: DC

## 2014-05-13 NOTE — Telephone Encounter (Signed)
Pt states she ran out of Prednisone and needs refill. Pt aware to keep 05-31-14 appt with CY. Rx has been sent. Nothing more needed at this time.

## 2014-05-16 ENCOUNTER — Ambulatory Visit (INDEPENDENT_AMBULATORY_CARE_PROVIDER_SITE_OTHER): Payer: Self-pay

## 2014-05-16 DIAGNOSIS — J4520 Mild intermittent asthma, uncomplicated: Secondary | ICD-10-CM

## 2014-05-17 MED ORDER — OMALIZUMAB 150 MG ~~LOC~~ SOLR
225.0000 mg | Freq: Once | SUBCUTANEOUS | Status: AC
  Administered 2014-05-16: 225 mg via SUBCUTANEOUS

## 2014-05-19 IMAGING — CR DG CHEST 2V
2 series · 2 of 2 positions shown · non-contrast
Comparison: 04/15/2013.

CLINICAL DATA: Short of breath.  Asthma.

EXAM:
CHEST  2 VIEW

[w chest pa]
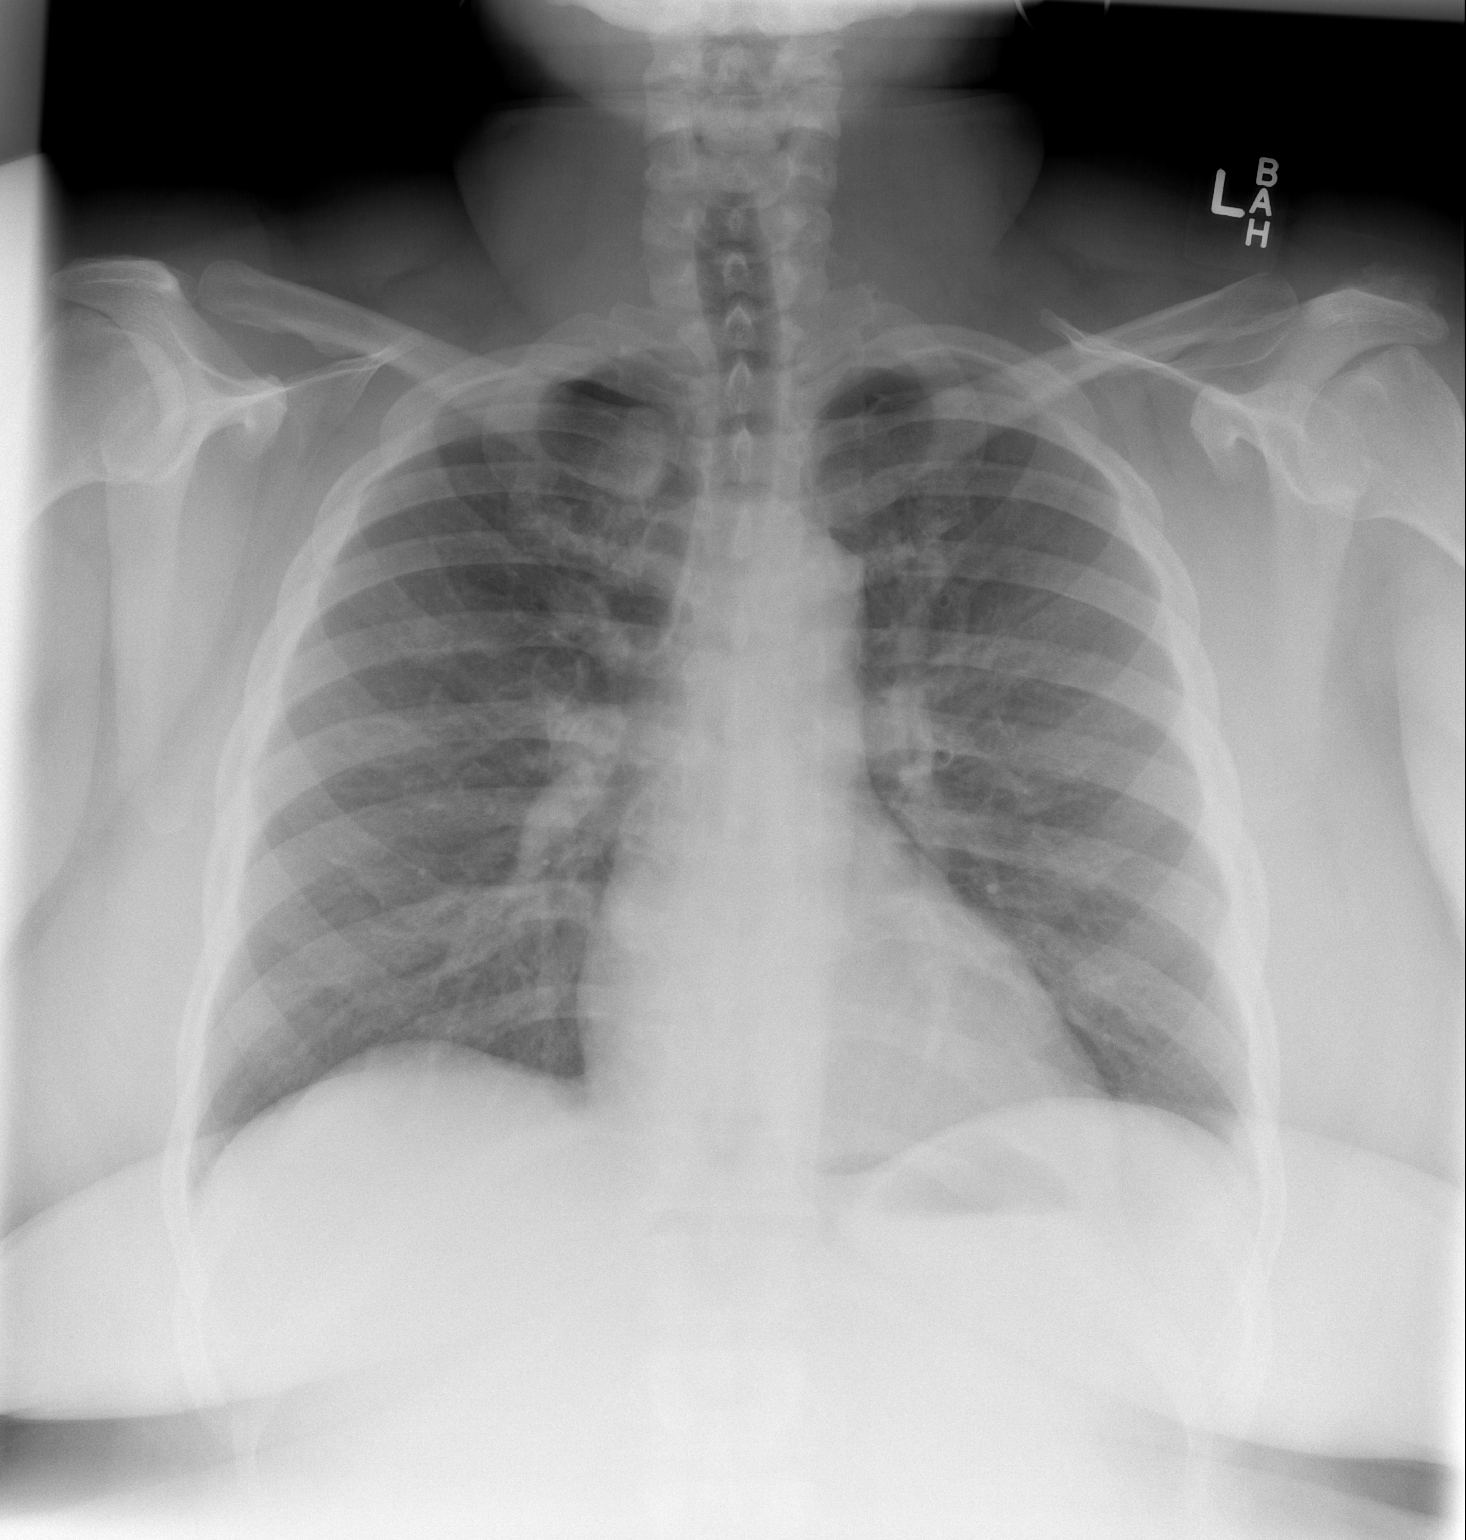

[w chest lat]
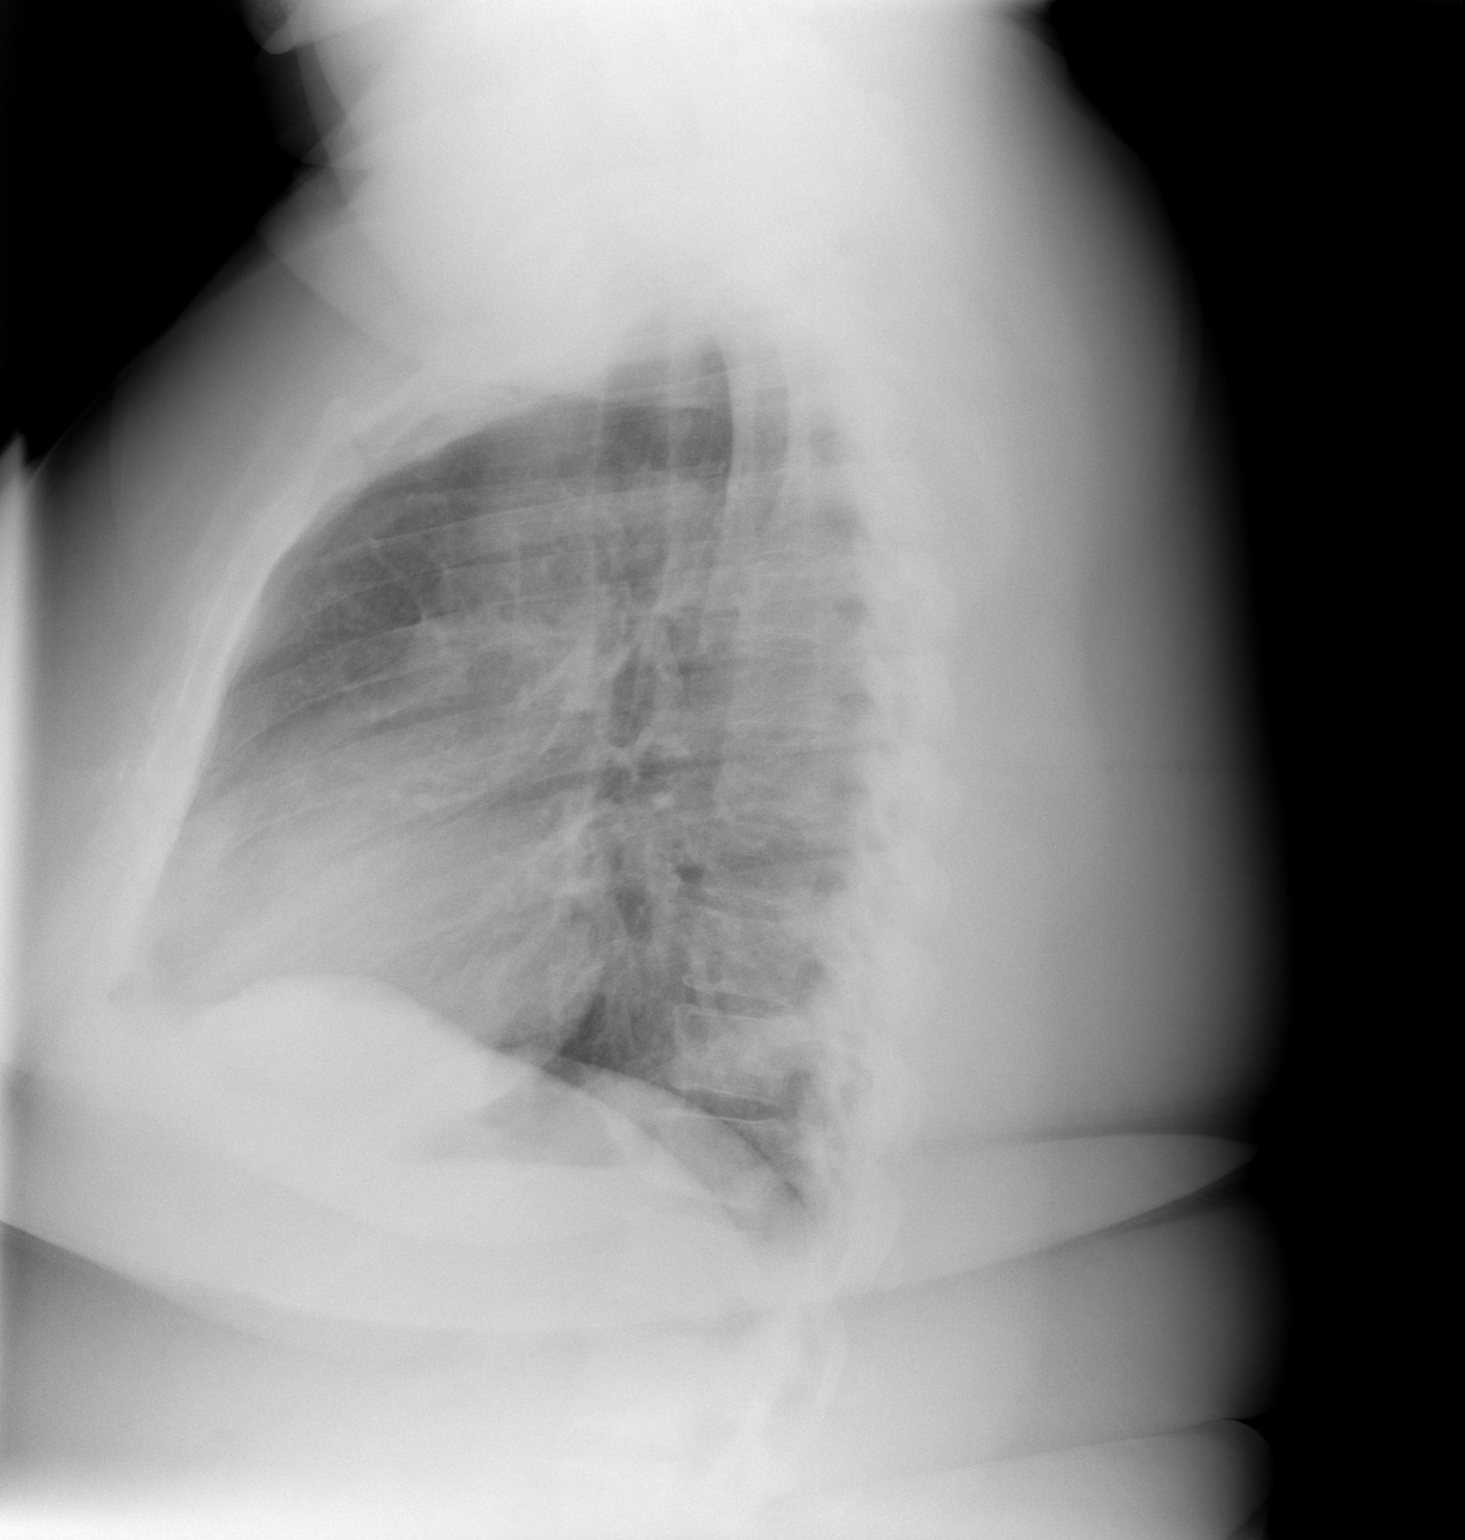

[2 of 2 positions shown; findings below may reference images not displayed]

FINDINGS: Cardiopericardial silhouette within normal limits. Mediastinal
contours normal. Trachea midline. No airspace disease or effusion.
IMPRESSION: No active cardiopulmonary disease.

## 2014-05-24 ENCOUNTER — Other Ambulatory Visit: Payer: Self-pay | Admitting: Internal Medicine

## 2014-05-27 IMAGING — CR DG THORACIC SPINE 3V
3 series · 3 of 3 positions shown · non-contrast
Comparison: PA and lateral chest x-ray May 03, 2013

CLINICAL DATA: Mid thoracic pain, possible compression fracture

EXAM:
THORACIC SPINE - 2 VIEW + SWIMMERS

[t thoracic spine ap]
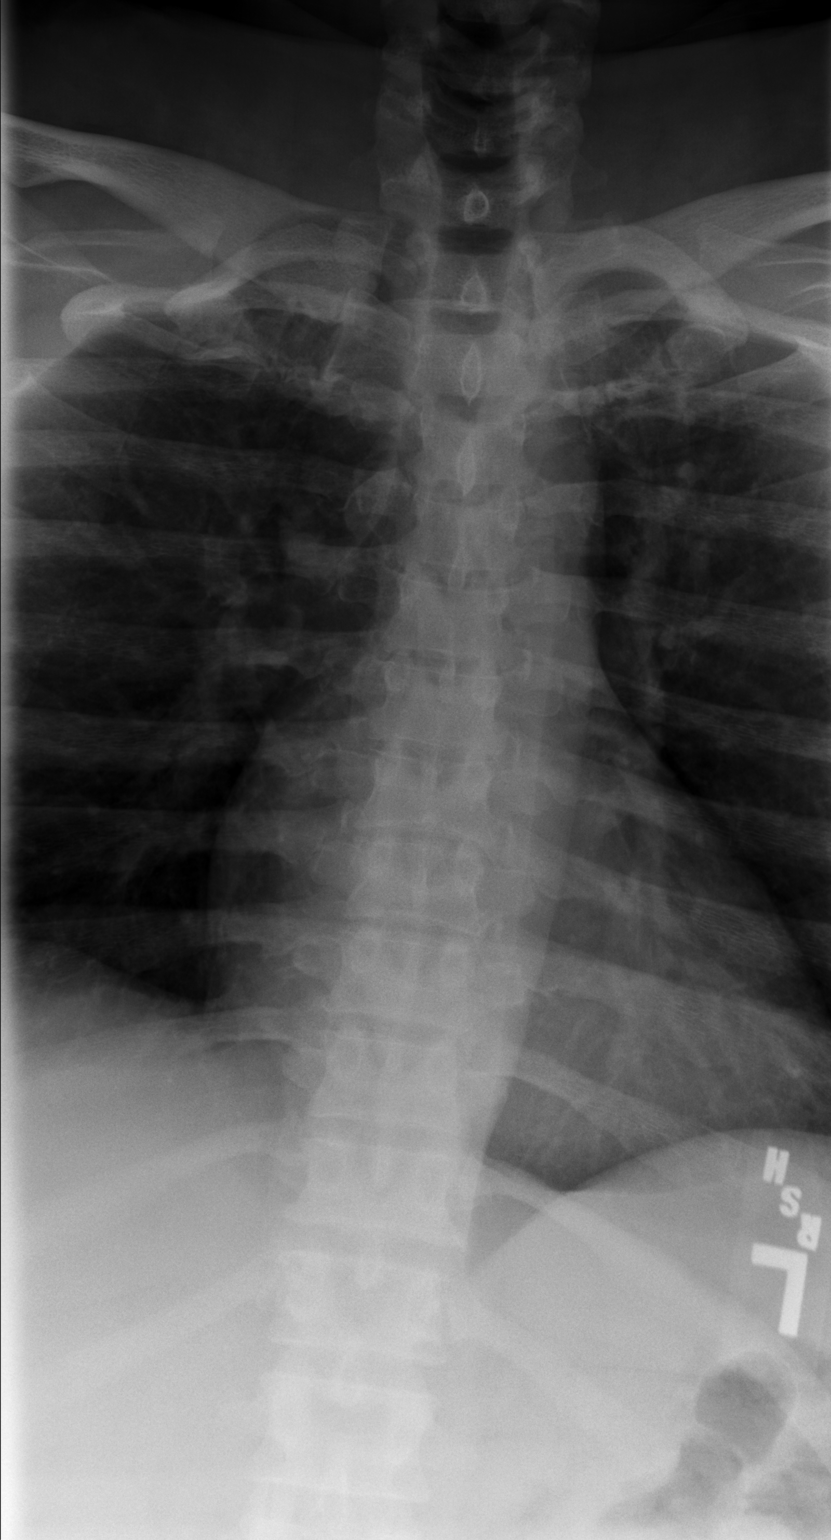

[t thoracic breathing lat]
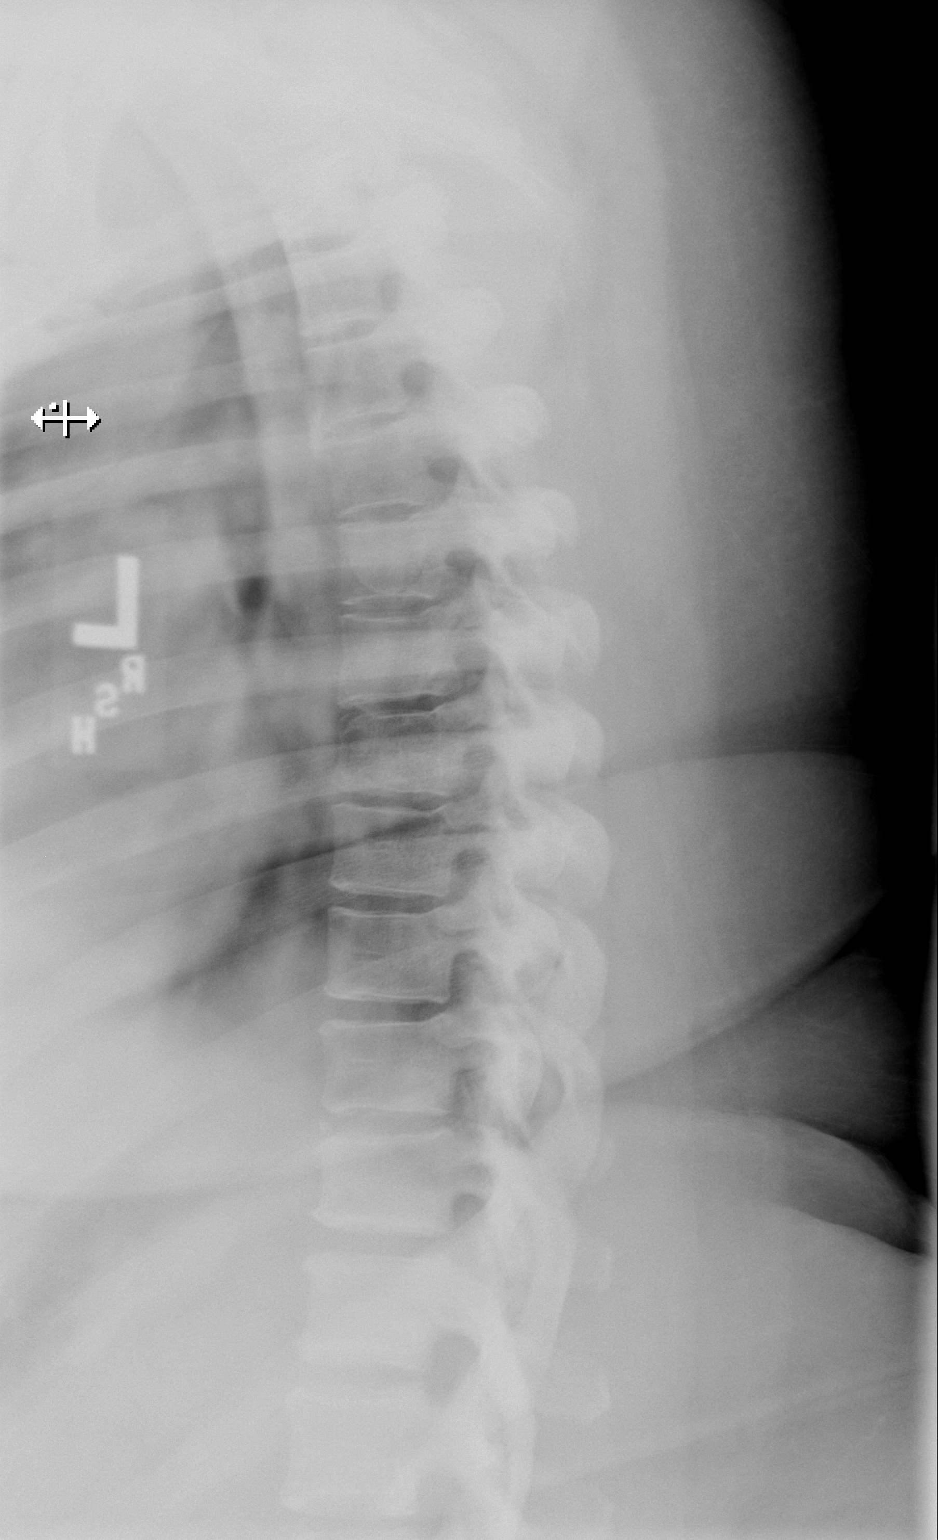

[t thoracic swimmers breathing]
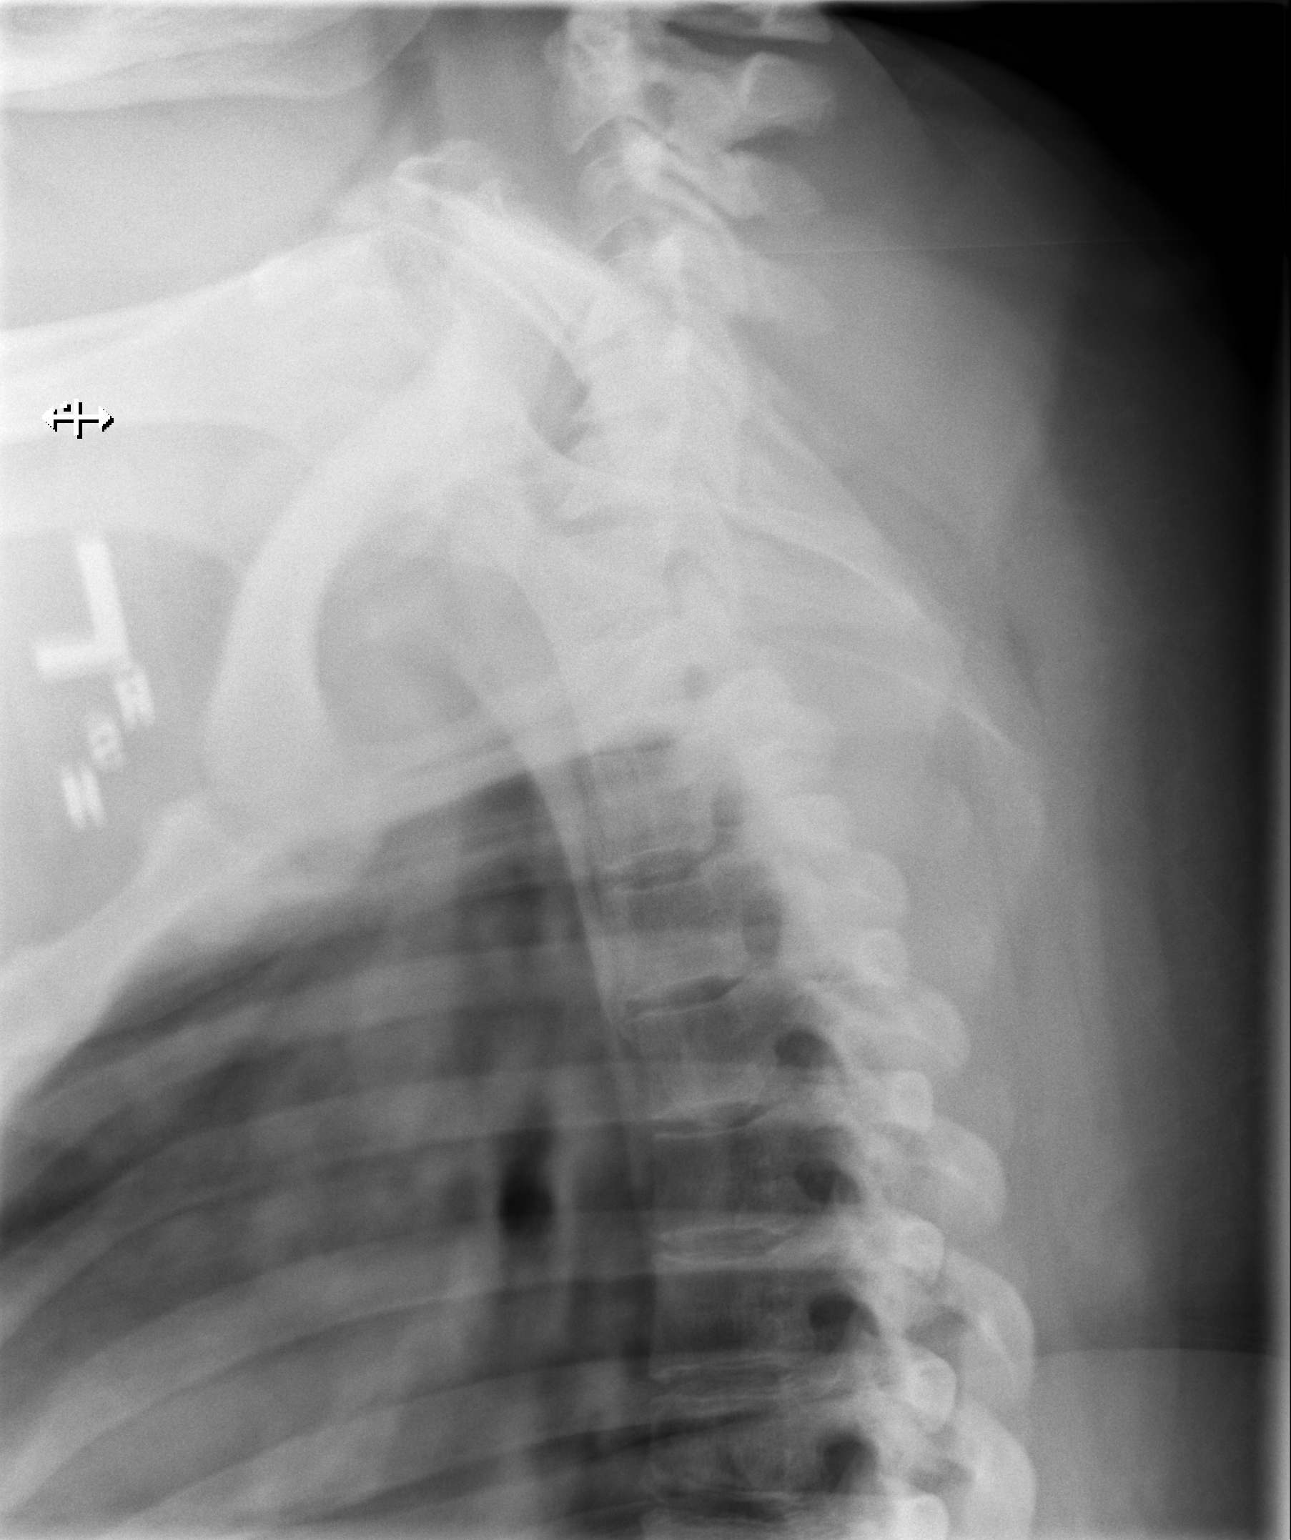

[3 of 3 positions shown; findings below may reference images not displayed]

FINDINGS: The thoracic vertebral bodies are preserved in height. The
intervertebral disc space heights are well maintained. The pedicles
appear intact. No abnormal paravertebral soft tissue densities are
demonstrated.
IMPRESSION: No acute bony abnormality of the thoracic spine is demonstrated on
this study.

## 2014-05-30 ENCOUNTER — Ambulatory Visit: Payer: Self-pay

## 2014-05-31 ENCOUNTER — Ambulatory Visit: Payer: Self-pay | Admitting: Internal Medicine

## 2014-06-02 ENCOUNTER — Telehealth: Payer: Self-pay | Admitting: Internal Medicine

## 2014-06-02 NOTE — Telephone Encounter (Signed)
[name] aware and will talk to her when she comes in

## 2014-06-02 NOTE — Telephone Encounter (Signed)
Pt needing refills of Prednisone and Albuterol HFA sent to pharmacy. These have been sent.  Please advise if patient can be worked in sooner that 3/21, pt states that she needs something sooner than this. Okay to schedule with TP if no availabilities? ------------ April [name],   Pt asking to speak with [facility] face-to-face tomorrow when she comes in for Xolair injection. Pt advised that she will be on the floor working with Dr Annamaria Boots and that I can get a message to her to call her back- pt refused stating that she wants/needs to speak with her face-to-face tomorrow.  Will send to [facility] as FYI.

## 2014-06-03 ENCOUNTER — Ambulatory Visit: Payer: Self-pay

## 2014-06-03 ENCOUNTER — Ambulatory Visit (INDEPENDENT_AMBULATORY_CARE_PROVIDER_SITE_OTHER): Payer: Self-pay

## 2014-06-03 DIAGNOSIS — J449 Chronic obstructive pulmonary disease, unspecified: Secondary | ICD-10-CM

## 2014-06-03 MED ORDER — OMALIZUMAB 150 MG ~~LOC~~ SOLR
225.0000 mg | Freq: Once | SUBCUTANEOUS | Status: AC
  Administered 2014-06-03: 225 mg via SUBCUTANEOUS

## 2014-06-03 NOTE — Telephone Encounter (Signed)
Pt came by the allergy lab for her Xolair today-she had no further questions or concerns for me. Will close phone message.

## 2014-06-06 ENCOUNTER — Emergency Department (HOSPITAL_COMMUNITY)
Admission: EM | Admit: 2014-06-06 | Discharge: 2014-06-06 | Disposition: A | Discharge status: Home / Self Care | Payer: Self-pay | Attending: Emergency Medicine | Admitting: Emergency Medicine

## 2014-06-06 ENCOUNTER — Emergency Department (HOSPITAL_COMMUNITY): Payer: Self-pay

## 2014-06-06 ENCOUNTER — Telehealth: Payer: Self-pay | Admitting: Internal Medicine

## 2014-06-06 ENCOUNTER — Encounter (HOSPITAL_COMMUNITY): Payer: Self-pay | Admitting: Family Medicine

## 2014-06-06 DIAGNOSIS — Z7951 Long term (current) use of inhaled steroids: Secondary | ICD-10-CM | POA: Insufficient documentation

## 2014-06-06 DIAGNOSIS — I10 Essential (primary) hypertension: Secondary | ICD-10-CM | POA: Insufficient documentation

## 2014-06-06 DIAGNOSIS — Z872 Personal history of diseases of the skin and subcutaneous tissue: Secondary | ICD-10-CM | POA: Insufficient documentation

## 2014-06-06 DIAGNOSIS — M79651 Pain in right thigh: Secondary | ICD-10-CM | POA: Insufficient documentation

## 2014-06-06 DIAGNOSIS — K219 Gastro-esophageal reflux disease without esophagitis: Secondary | ICD-10-CM | POA: Insufficient documentation

## 2014-06-06 DIAGNOSIS — Z7952 Long term (current) use of systemic steroids: Secondary | ICD-10-CM | POA: Insufficient documentation

## 2014-06-06 DIAGNOSIS — Z87891 Personal history of nicotine dependence: Secondary | ICD-10-CM | POA: Insufficient documentation

## 2014-06-06 DIAGNOSIS — Z79899 Other long term (current) drug therapy: Secondary | ICD-10-CM | POA: Insufficient documentation

## 2014-06-06 DIAGNOSIS — J441 Chronic obstructive pulmonary disease with (acute) exacerbation: Secondary | ICD-10-CM | POA: Insufficient documentation

## 2014-06-06 DIAGNOSIS — Z8659 Personal history of other mental and behavioral disorders: Secondary | ICD-10-CM | POA: Insufficient documentation

## 2014-06-06 DIAGNOSIS — Z8619 Personal history of other infectious and parasitic diseases: Secondary | ICD-10-CM | POA: Insufficient documentation

## 2014-06-06 DIAGNOSIS — J45901 Unspecified asthma with (acute) exacerbation: Secondary | ICD-10-CM

## 2014-06-06 DIAGNOSIS — Z791 Long term (current) use of non-steroidal anti-inflammatories (NSAID): Secondary | ICD-10-CM | POA: Insufficient documentation

## 2014-06-06 DIAGNOSIS — Z8742 Personal history of other diseases of the female genital tract: Secondary | ICD-10-CM | POA: Insufficient documentation

## 2014-06-06 MED ORDER — ALBUTEROL SULFATE HFA 108 (90 BASE) MCG/ACT IN AERS
2.0000 | INHALATION_SPRAY | Freq: Once | RESPIRATORY_TRACT | Status: AC
  Administered 2014-06-06: 2 via RESPIRATORY_TRACT
  Filled 2014-06-06: qty 6.7

## 2014-06-06 MED ORDER — IPRATROPIUM-ALBUTEROL 0.5-2.5 (3) MG/3ML IN SOLN
3.0000 mL | Freq: Once | RESPIRATORY_TRACT | Status: AC
  Administered 2014-06-06: 3 mL via RESPIRATORY_TRACT
  Filled 2014-06-06: qty 3

## 2014-06-06 MED ORDER — PREDNISONE 20 MG PO TABS
60.0000 mg | ORAL_TABLET | Freq: Once | ORAL | Status: AC
  Administered 2014-06-06: 60 mg via ORAL
  Filled 2014-06-06: qty 3

## 2014-06-06 MED ORDER — PREDNISONE 20 MG PO TABS: ORAL_TABLET | ORAL | Status: DC

## 2014-06-06 NOTE — ED Notes (Signed)
Declined W/C at D/C and was escorted to lobby by RN. 

## 2014-06-06 NOTE — Telephone Encounter (Signed)
LMTCB

## 2014-06-06 NOTE — Discharge Instructions (Signed)
Return to the emergency room with worsening of symptoms, new symptoms or with symptoms that are concerning, shortness of breath, chest pain, difficulty breathing, throat tightness, lightheadedness, feel faint. Take prednisone daily for next four days. Call to make an earlier appointment with your pulmonology doctor. Please call your doctor for a followup appointment within 24-48 hours. When you talk to your doctor please let them know that you were seen in the emergency department and have them acquire all of your records so that they can discuss the findings with you and formulate a treatment plan to fully care for your new and ongoing problems.  Read below instructions and follow recommendations.   Bronchospasm A bronchospasm is a spasm or tightening of the airways going into the lungs. During a bronchospasm breathing becomes more difficult because the airways get smaller. When this happens there can be coughing, a whistling sound when breathing (wheezing), and difficulty breathing. Bronchospasm is often associated with asthma, but not all patients who experience a bronchospasm have asthma. CAUSES  A bronchospasm is caused by inflammation or irritation of the airways. The inflammation or irritation may be triggered by:   Allergies (such as to animals, pollen, food, or mold). Allergens that cause bronchospasm may cause wheezing immediately after exposure or many hours later.   Infection. Viral infections are believed to be the most common cause of bronchospasm.   Exercise.   Irritants (such as pollution, cigarette smoke, strong odors, aerosol sprays, and paint fumes).   Weather changes. Winds increase molds and pollens in the air. Rain refreshes the air by washing irritants out. Cold air may cause inflammation.   Stress and emotional upset.  SIGNS AND SYMPTOMS   Wheezing.   Excessive nighttime coughing.   Frequent or severe coughing with a simple cold.   Chest tightness.    Shortness of breath.  DIAGNOSIS  Bronchospasm is usually diagnosed through a history and physical exam. Tests, such as chest X-rays, are sometimes done to look for other conditions. TREATMENT   Inhaled medicines can be given to open up your airways and help you breathe. The medicines can be given using either an inhaler or a nebulizer machine.  Corticosteroid medicines may be given for severe bronchospasm, usually when it is associated with asthma. HOME CARE INSTRUCTIONS   Always have a plan prepared for seeking medical care. Know when to call your health care provider and local emergency services (911 in the [place] Know where you can access local emergency care.  Only take medicines as directed by your health care provider.  If you were prescribed an inhaler or nebulizer machine, ask your health care provider to explain how to use it correctly. Always use a spacer with your inhaler if you were given one.  It is necessary to remain calm during an attack. Try to relax and breathe more slowly.  Control your home environment in the following ways:   Change your heating and air conditioning filter at least once a month.   Limit your use of fireplaces and wood stoves.  Do not smoke and do not allow smoking in your home.   Avoid exposure to perfumes and fragrances.   Get rid of pests (such as roaches and mice) and their droppings.   Throw away plants if you see mold on them.   Keep your house clean and dust free.   Replace carpet with wood, tile, or vinyl flooring. Carpet can trap dander and dust.   Use allergy-proof pillows, mattress covers, and box  spring covers.   Wash bed sheets and blankets every week in hot water and dry them in a dryer.   Use blankets that are made of polyester or cotton.   Wash hands frequently. SEEK MEDICAL CARE IF:   You have muscle aches.   You have chest pain.   The sputum changes from clear or white to yellow, green, gray,  or bloody.   The sputum you cough up gets thicker.   There are problems that may be related to the medicine you are given, such as a rash, itching, swelling, or trouble breathing.  SEEK IMMEDIATE MEDICAL CARE IF:   You have worsening wheezing and coughing even after taking your prescribed medicines.   You have increased difficulty breathing.   You develop severe chest pain. MAKE SURE YOU:   Understand these instructions.  Will watch your condition.  Will get help right away if you are not doing well or get worse. Document Released: 04/18/2003 Document Revised: 04/20/2013 Document Reviewed: 10/05/2012 [facility] Patient Information 2015 [place], [place]. This information is not intended to replace advice given to you by your health care provider. Make sure you discuss any questions you have with your health care provider.

## 2014-06-06 NOTE — ED Notes (Signed)
PT missed pulmonary APPT. Last week. Pt needs steroids and inhaler.

## 2014-06-06 NOTE — ED Provider Notes (Signed)
CSN: [id number]     Arrival date & time 06/06/14  1208 History  This chart was scribed for non-physician practitioner, Pura Spice, PA-C, working with Leota Jacobsen, MD, by Ian Bushman, ED Scribe. This patient was seen in room TR11C/TR11C and the patient's care was started at 1:28 PM. First MD Initiated Contact with Patient 06/06/14 1319     Chief Complaint  Patient presents with  . Asthma  . Leg Pain     (Consider location/radiation/quality/duration/timing/severity/associated sxs/prior Treatment) Patient is a 44 y.o. female presenting with asthma and leg pain. The history is provided by the patient. No language interpreter was used.  Asthma Pertinent negatives include no abdominal pain.  Leg Pain Associated symptoms: no fever      HPI Comment April [name] is a 44 y.o. female who presents to the Emergency Department complaining of worsening asthma  onset 7 days ago. Patient has been using her inhaler more frequently at home and also has a nebulizer which she also uses. Patient was on 10 mg of steroids everyday. She notes that she ran out of her prednisone last week.  Review of pulmonary notes show patient is noncompliant with her prednisone. Patient notes that her visit today is not as bad as her visit to the ED in January. Patient had her allergy shot and notes that her symptoms got worse about 7 days ago.  Denies fevers, chills, runny nose/conjestion, sore throat, abdominal pain. Patient has high blood pressure.   Patient also notes of anterior right thigh pain onset three days ago.  She states that her pain is worse after sitting on it for a long time and denies any knee pain  Notes she is able to walk and denies numbness, tingling, or any recent injury.       Past Medical History  Diagnosis Date  . Acanthosis nigricans   . Menorrhagia   . Hypertension, essential   . Insomnia   . Morbid obesity   . Helicobacter pylori (H. pylori) infection   . Sleep apnea     Sleep  study 2008 - mild OSA, not enough events to titrate CPAP  . COPD (chronic obstructive pulmonary disease)     PFTs in 2002, FEV1/FVC 65, no post bronchodilater test done  . Asthma     Followed by Dr. Annamaria Boots (pulmonology); receives every other week omalizumab injections; has frequent exacerbations  . Depression   . GERD (gastroesophageal reflux disease)   . Tobacco user   . Obesity   . Shortness of breath   . FXTKWIOX(735.3)    Past Surgical History  Procedure Laterality Date  . Tubal ligation  1996    bilateral  . Breast reduction surgery  09/2011   Family History  Problem Relation Age of Onset  . Asthma Daughter   . Cancer Paternal Aunt   . Asthma Maternal Grandmother   . Hypertension Mother    History  Substance Use Topics  . Smoking status: Former Smoker -- 0.25 packs/day for 18 years    Types: Cigarettes    Quit date: 10/04/2012  . Smokeless tobacco: Never Used  . Alcohol Use: No   OB History    No data available     Review of Systems  Constitutional: Negative for fever and chills.  HENT: Negative for congestion, rhinorrhea and sore throat.   Respiratory: Positive for wheezing.   Gastrointestinal: Negative for abdominal pain.  Musculoskeletal: Positive for myalgias.      Allergies  Review of patient's  allergies indicates no known allergies.  Home Medications   Prior to Admission medications   Medication Sig Start Date End Date Taking? Authorizing Provider  albuterol (PROAIR HFA) 108 (90 BASE) MCG/ACT inhaler Inhale 2 puffs into the lungs every 4 (four) hours as needed for wheezing or shortness of breath. 09/15/13   Axel Filler, MD  albuterol (PROVENTIL) (2.5 MG/3ML) 0.083% nebulizer solution Take 3 mLs (2.5 mg total) by nebulization every 4 (four) hours as needed for wheezing or shortness of breath. 06/09/13   Pollie Friar, MD  cyclobenzaprine (FLEXERIL) 5 MG tablet Take 1 tablet (5 mg total) by mouth 3 (three) times daily as needed for muscle spasms.  01/27/14   Billy Fischer, MD  Fluticasone Furoate-Vilanterol (BREO ELLIPTA) 100-25 MCG/INH AEPB Inhale 1 puff into the lungs daily. Rinse mouth after each use. Patient not taking: Reported on 05/09/2014 03/30/14   Deneise Lever, MD  hydrochlorothiazide (MICROZIDE) 12.5 MG capsule Take 1 capsule (12.5 mg total) by mouth daily. 03/28/14 03/28/15  Julious Oka, MD  ibuprofen (MOTRIN IB) 200 MG tablet Take 4 tablets (800 mg total) by mouth every 8 (eight) hours as needed for moderate pain. 12/08/13   Albin Felling, MD  methocarbamol (ROBAXIN) 500 MG tablet Take 2 tablets (1,000 mg total) by mouth 2 (two) times daily. Patient not taking: Reported on 05/09/2014 01/17/14   Britt Bottom, NP  mometasone-formoterol [facility]) 200-5 MCG/ACT AERO 2 puffs then rinse mouth, twice daily 12/31/13   Deneise Lever, MD  omeprazole (PRILOSEC) 20 MG capsule Take 20 mg by mouth 2 (two) times daily before a meal.    Historical Provider, MD  oxyCODONE-acetaminophen (PERCOCET/ROXICET) 5-325 MG per tablet Take 1-2 tablets by mouth every 8 (eight) hours as needed for severe pain. 05/09/14   Dot Lanes, MD  predniSONE (DELTASONE) 20 MG tablet 2 tabs po daily x 4 days 06/06/14   Pura Spice, PA-C  zolpidem (AMBIEN) 10 MG tablet Take 10 mg by mouth at bedtime as needed for sleep.    Historical Provider, MD   BP 160/90 mmHg  Pulse 92  Temp(Src) 98.6 F (37 C)  Resp 18  SpO2 100%  LMP 05/06/2014 (Exact Date) Physical Exam  Constitutional: She appears well-developed and well-nourished. No distress.  HENT:  Head: Normocephalic and atraumatic.  Mouth/Throat: Oropharynx is clear and moist.  Eyes: Conjunctivae and EOM are normal. Right eye exhibits no discharge. Left eye exhibits no discharge.  Cardiovascular: Normal rate, regular rhythm and normal heart sounds.   2+ pedal pulses equal bilaterally.  Pulmonary/Chest: Effort normal and breath sounds normal.  Mild to no respiratory distress. No accessory muscle use.  Equal chest excursion. Diffuse expiratory wheezes with diminished air movement.  Abdominal: Soft. Bowel sounds are normal. She exhibits no distension. There is no tenderness.  Musculoskeletal:  Right anterior thigh tenderness to palpation without any appreciated swelling. Patient refusing to remove her pants. Full range of motion.  Lymphadenopathy:    She has no cervical adenopathy.  Neurological: She is alert. She exhibits normal muscle tone. Coordination normal.  Normal gait. Equal strength bilaterally and lower extremity.  Skin: Skin is warm and dry. She is not diaphoretic.  Nursing note and vitals reviewed.   ED Course  Procedures (including critical care time) DIAGNOSTIC STUDIES: Oxygen Saturation is 100% on RA, normal by my interpretation.    COORDINATION OF CARE: 1:36 PM Discussed treatment plan with patient at beside, the patient agrees with the plan and has no further  questions at this time.   Labs Review Labs Reviewed - No data to display  Imaging Review Dg Chest 2 View (if Patient Has Fever And/or Copd)  06/06/2014   CLINICAL DATA:  Shortness of breath.  Asthma attack  EXAM: CHEST  2 VIEW  COMPARISON:  03/27/2014  FINDINGS: Chronic interstitial coarsening. There is no edema, consolidation, effusion, or pneumothorax. Heart size and aortic contours are normal.  IMPRESSION: Chronic bronchitic change without acute superimposed disease.   Electronically Signed   By: Jorje Guild M.D.   On: 06/06/2014 14:14     EKG Interpretation None      MDM   Final diagnoses:  Asthma exacerbation  Right thigh pain   Patient ambulated in ED with O2 saturations maintained >94, no current signs of respiratory distress. Lung exam improved after nebulizer treatment. Prednisone given in the ED and pt will bd dc with 4 day burst. Pt states they are breathing at baseline. Pt has been instructed to continue using prescribed medications and to speak with PCP about today's exacerbation as well  as call for earlier appointment with her pulmonologist. Patient also with complaint of right anterior leg pain. Is worse with movement and ambulation. She refused to remove her pants for inspection. I doubt fracture. Patient ambulatory. Stress protocol as well as follow-up with PCP for persistent symptoms.  Discussed return precautions with patient. Discussed all results and patient verbalizes understanding and agrees with plan.  I personally performed the services described in this documentation, which was scribed in my presence. The recorded information has been reviewed and is accurate.    Pura Spice, PA-C 06/06/14 1527  Leota Jacobsen, MD 06/07/14 [phone]

## 2014-06-06 NOTE — ED Notes (Signed)
Pt here for asthma attack worsening over te past few days despite using her inhaler.

## 2014-06-06 NOTE — ED Notes (Signed)
Pt reports having an allergyh shogt on Friday but could not get the Asthma checked.

## 2014-06-06 NOTE — Telephone Encounter (Signed)
Offer prednisone 10 mg, # 30, 1 daily, ref x 2

## 2014-06-06 NOTE — Telephone Encounter (Signed)
Pt reports having cough, SOB and chest tightness x 1 week.  Pt states she ran out of Pred 10mg  x 1 week ago and that's when symptoms started.  Requests refill of Pred.   No Known Allergies  [facility] [address]  Please advise Dr Annamaria Boots. Thanks.

## 2014-06-07 MED ORDER — PREDNISONE 10 MG PO TABS: 10.0000 mg | ORAL_TABLET | Freq: Every day | ORAL | Status: DC

## 2014-06-07 NOTE — Telephone Encounter (Signed)
Called and spoke to pt. Informed pt of the recs per CY. Rx sent to preferred pharmacy. Pt verbalized understanding and denied any further questions or concerns at this time.  

## 2014-06-17 ENCOUNTER — Ambulatory Visit: Payer: Self-pay

## 2014-06-22 IMAGING — CR DG CHEST 2V
2 series · 2 of 2 positions shown · non-contrast
Comparison: DG CHEST 2 VIEW dated 05/03/2013

CLINICAL DATA: Chest congestion with cough and shortness of breath
for 3 days.

EXAM:
CHEST  2 VIEW

[w chest pa]
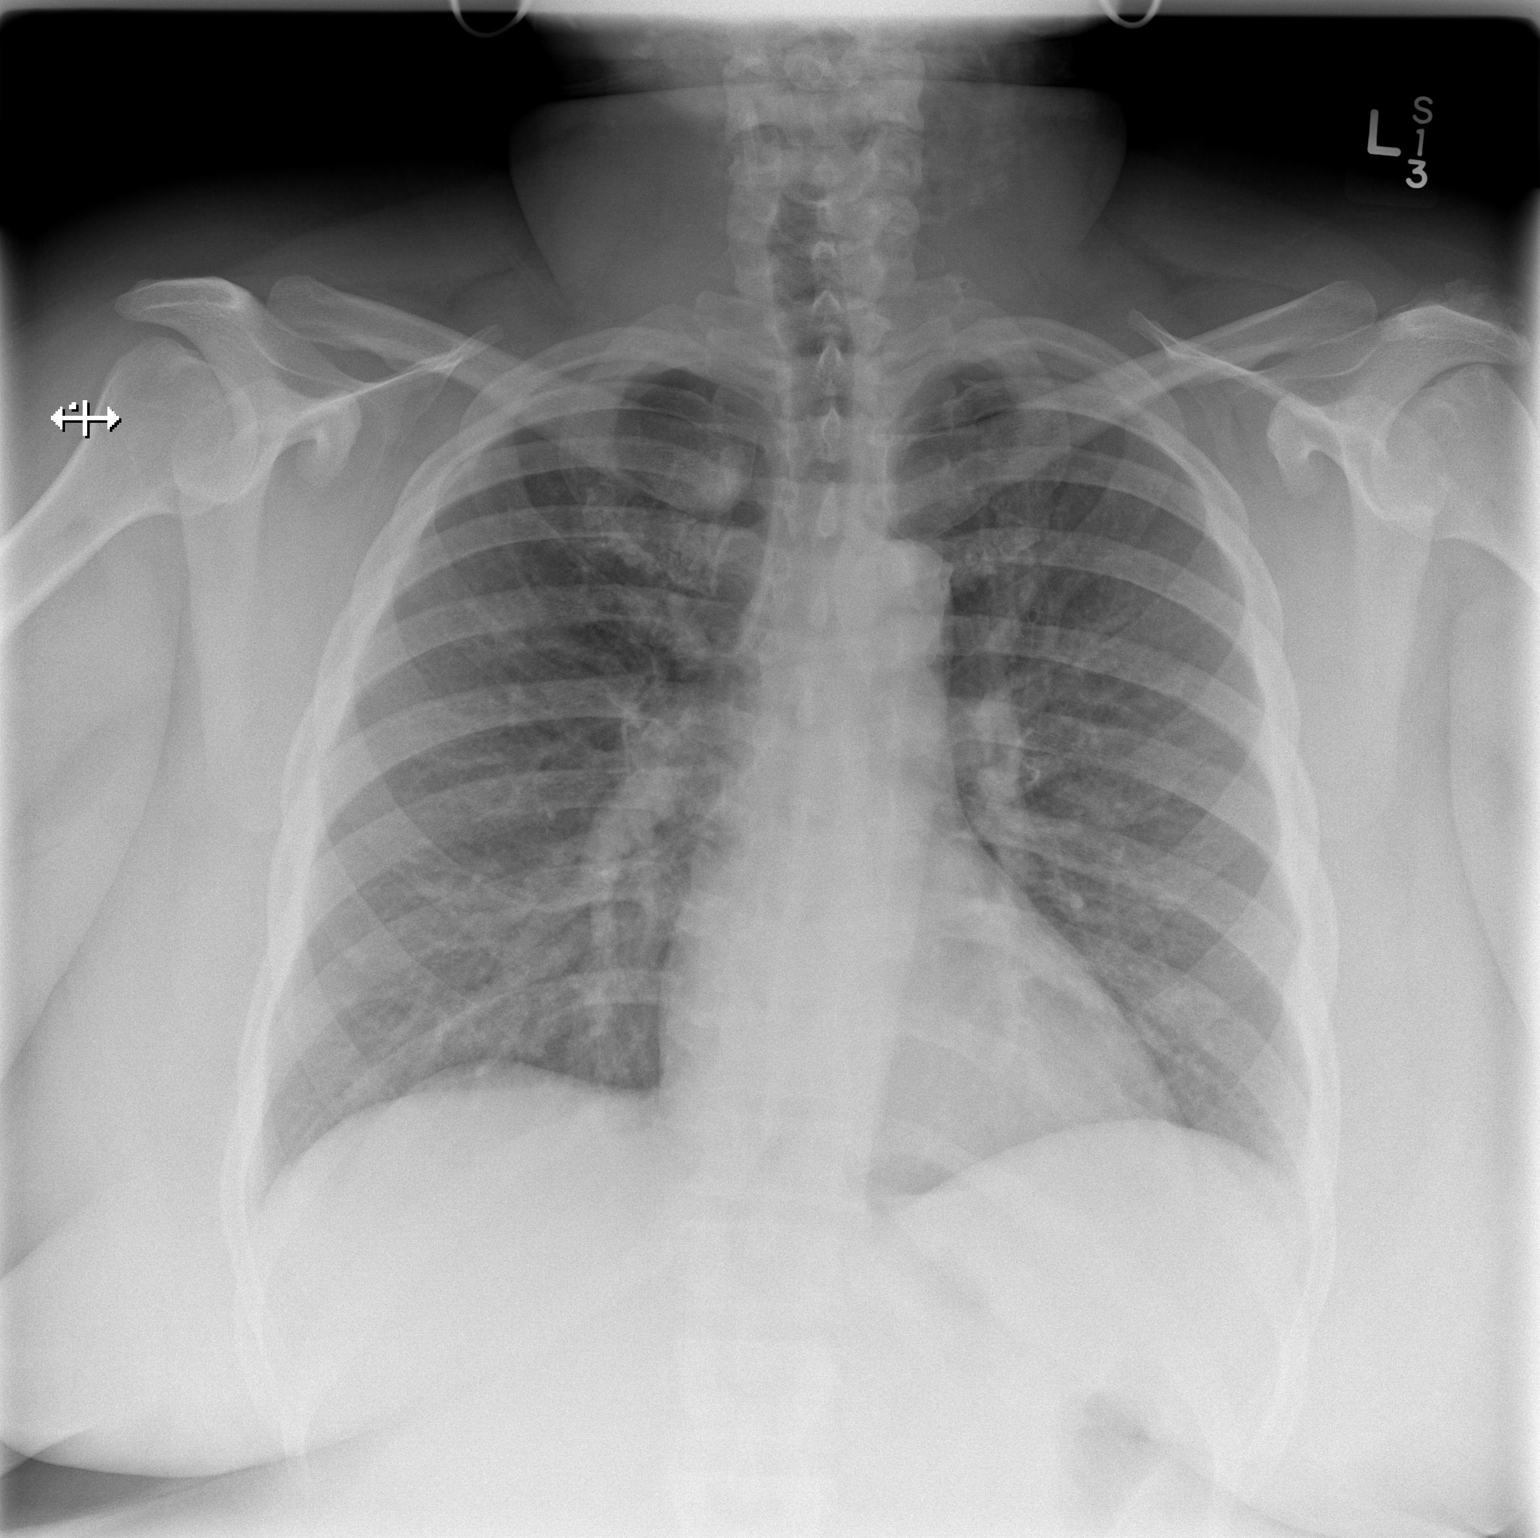

[w chest lat]
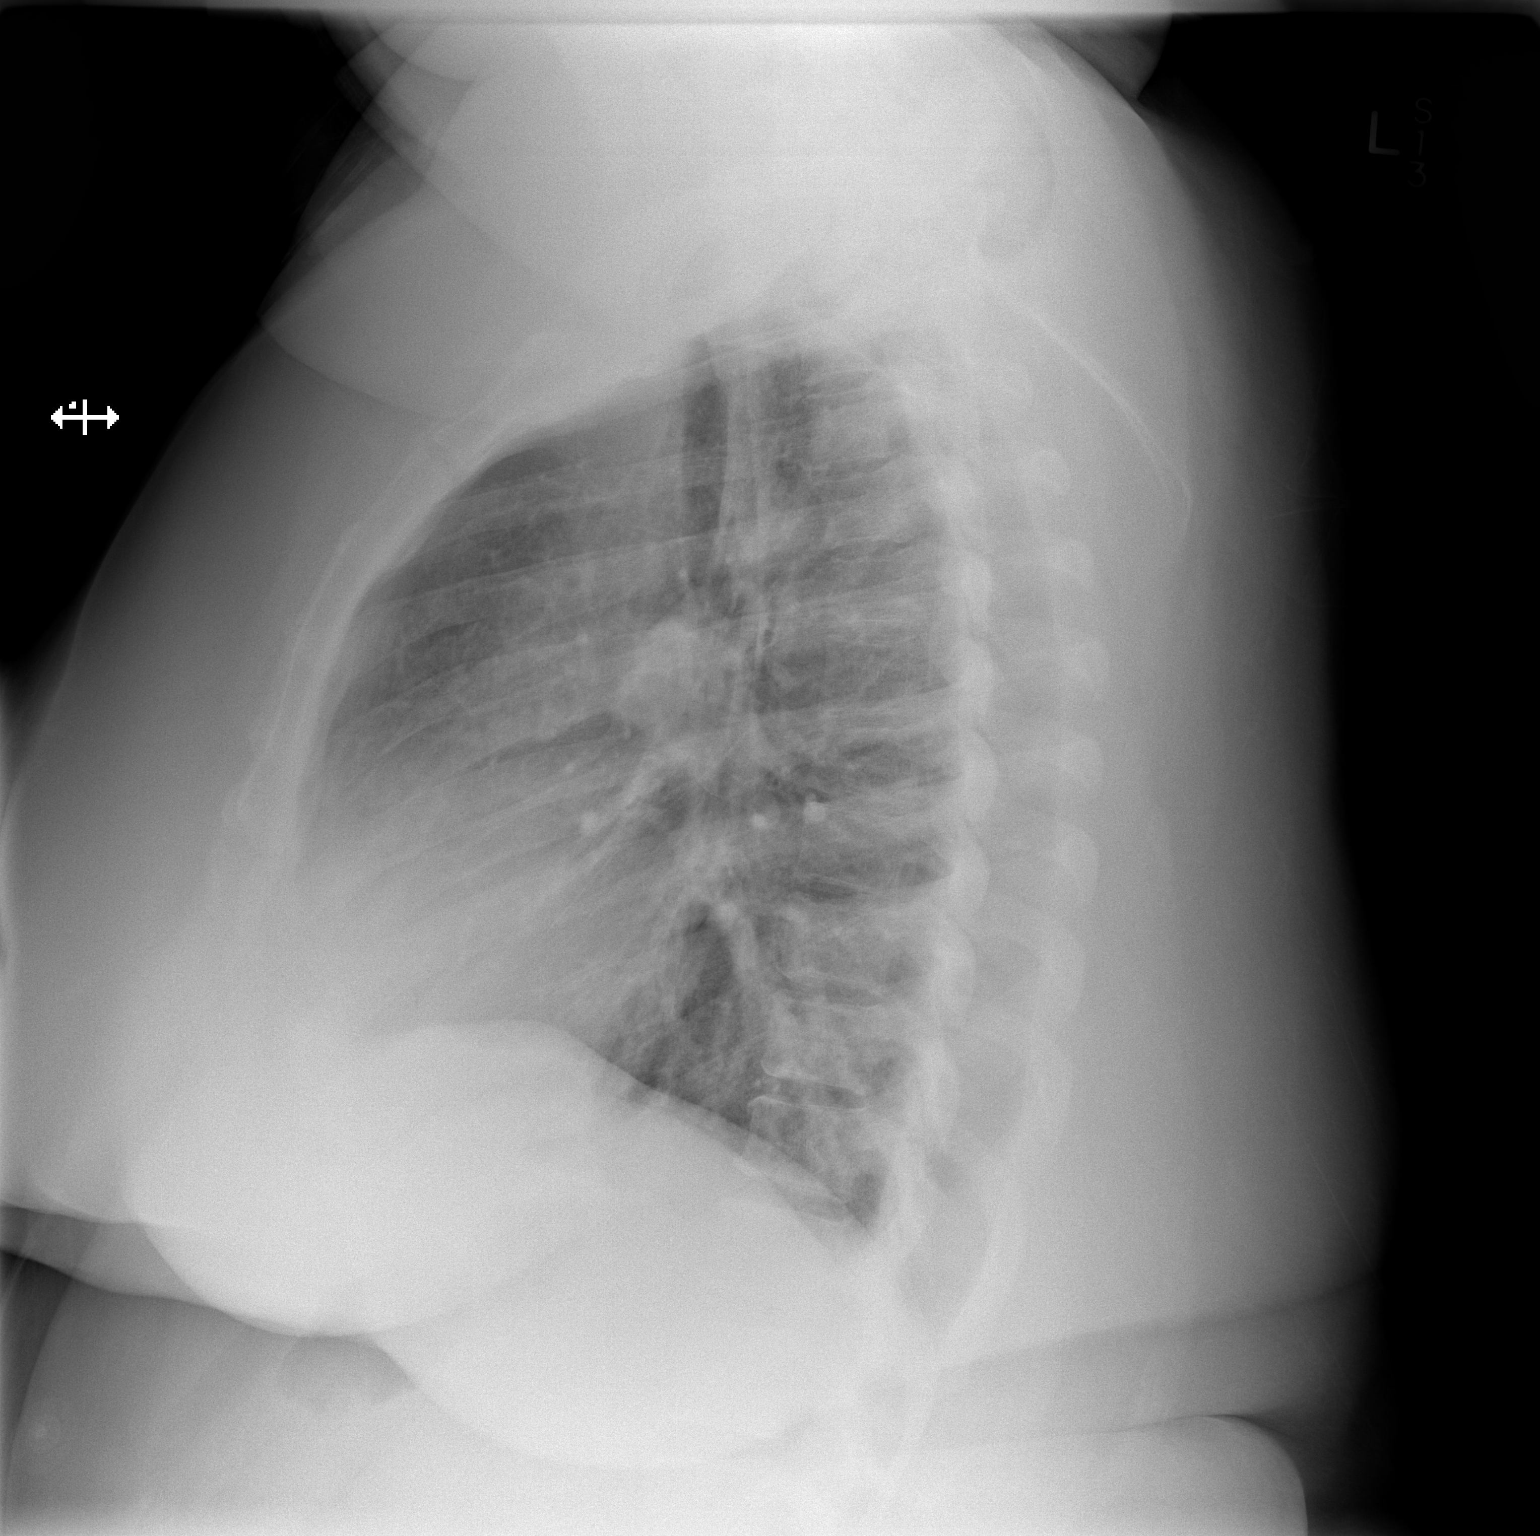

[2 of 2 positions shown; findings below may reference images not displayed]

FINDINGS: Midline trachea. Normal heart size and mediastinal contours. No
pleural effusion or pneumothorax. Diffuse peribronchial thickening.
No lobar consolidation.
IMPRESSION: 1.  No acute cardiopulmonary disease.
2. Mild peribronchial thickening which may relate to chronic
bronchitis or smoking.

## 2014-06-27 ENCOUNTER — Ambulatory Visit: Payer: Self-pay

## 2014-06-29 ENCOUNTER — Ambulatory Visit (INDEPENDENT_AMBULATORY_CARE_PROVIDER_SITE_OTHER): Payer: Self-pay

## 2014-06-29 DIAGNOSIS — J4540 Moderate persistent asthma, uncomplicated: Secondary | ICD-10-CM

## 2014-06-30 MED ORDER — OMALIZUMAB 150 MG ~~LOC~~ SOLR
225.0000 mg | Freq: Once | SUBCUTANEOUS | Status: AC
  Administered 2014-06-29: 225 mg via SUBCUTANEOUS

## 2014-07-02 ENCOUNTER — Emergency Department (INDEPENDENT_AMBULATORY_CARE_PROVIDER_SITE_OTHER)
Admission: EM | Admit: 2014-07-02 | Discharge: 2014-07-02 | Disposition: A | Discharge status: Home / Self Care | Payer: Self-pay | Source: Home / Self Care | Attending: Family Medicine | Admitting: Family Medicine

## 2014-07-02 ENCOUNTER — Encounter (HOSPITAL_COMMUNITY): Payer: Self-pay

## 2014-07-02 DIAGNOSIS — W19XXXA Unspecified fall, initial encounter: Secondary | ICD-10-CM

## 2014-07-02 DIAGNOSIS — M79605 Pain in left leg: Secondary | ICD-10-CM

## 2014-07-02 DIAGNOSIS — M79602 Pain in left arm: Secondary | ICD-10-CM

## 2014-07-02 DIAGNOSIS — M5489 Other dorsalgia: Secondary | ICD-10-CM

## 2014-07-02 MED ORDER — MELOXICAM 7.5 MG PO TABS: 7.5000 mg | ORAL_TABLET | Freq: Every day | ORAL | Status: DC

## 2014-07-02 MED ORDER — TRAMADOL HCL 50 MG PO TABS: ORAL_TABLET | ORAL | Status: DC

## 2014-07-02 NOTE — ED Provider Notes (Signed)
CSN: [id number]     Arrival date & time 07/02/14  1120 History   First MD Initiated Contact with Patient 07/02/14 1301     Chief Complaint  Patient presents with  . Fall   (Consider location/radiation/quality/duration/timing/severity/associated sxs/prior Treatment) HPI Comments: Severely morbidly obese 44 year old female states that she was descending stairs last night and believes she may have twisted her right ankle. She states that pain has improved and has not the major complaint for today. Last evening she was mopping the floor she slipped and fell onto her back. She is complaining of pain along the right para thoracic and lumbar spinal musculature. She denies striking her head or injuring her neck. She is complaining of pain to the anterior aspect of the left lower leg below the knee.   Past Medical History  Diagnosis Date  . Acanthosis nigricans   . Menorrhagia   . Hypertension, essential   . Insomnia   . Morbid obesity   . Helicobacter pylori (H. pylori) infection   . Sleep apnea     Sleep study 2008 - mild OSA, not enough events to titrate CPAP  . COPD (chronic obstructive pulmonary disease)     PFTs in 2002, FEV1/FVC 65, no post bronchodilater test done  . Asthma     Followed by Dr. Annamaria Boots (pulmonology); receives every other week omalizumab injections; has frequent exacerbations  . Depression   . GERD (gastroesophageal reflux disease)   . Tobacco user   . Obesity   . Shortness of breath   . OHYWVPXT(062.6)    Past Surgical History  Procedure Laterality Date  . Tubal ligation  1996    bilateral  . Breast reduction surgery  09/2011   Family History  Problem Relation Age of Onset  . Asthma Daughter   . Cancer Paternal Aunt   . Asthma Maternal Grandmother   . Hypertension Mother    History  Substance Use Topics  . Smoking status: Former Smoker -- 0.25 packs/day for 18 years    Types: Cigarettes    Quit date: 10/04/2012  . Smokeless tobacco: Never Used  . Alcohol  Use: No   OB History    No data available     Review of Systems  Constitutional: Positive for activity change. Negative for fever and chills.  HENT: Negative.   Respiratory: Negative.   Cardiovascular: Negative.   Musculoskeletal: Positive for back pain. Negative for neck pain.       As per HPI  Skin: Negative for color change, pallor and rash.  Neurological: Negative.     Allergies  Review of patient's allergies indicates no known allergies.  Home Medications   Prior to Admission medications   Medication Sig Start Date End Date Taking? Authorizing Provider  albuterol (PROAIR HFA) 108 (90 BASE) MCG/ACT inhaler Inhale 2 puffs into the lungs every 4 (four) hours as needed for wheezing or shortness of breath. 09/15/13   Axel Filler, MD  albuterol (PROVENTIL) (2.5 MG/3ML) 0.083% nebulizer solution Take 3 mLs (2.5 mg total) by nebulization every 4 (four) hours as needed for wheezing or shortness of breath. 06/09/13   Pollie Friar, MD  cyclobenzaprine (FLEXERIL) 5 MG tablet Take 1 tablet (5 mg total) by mouth 3 (three) times daily as needed for muscle spasms. 01/27/14   Billy Fischer, MD  hydrochlorothiazide (MICROZIDE) 12.5 MG capsule Take 1 capsule (12.5 mg total) by mouth daily. 03/28/14 03/28/15  Julious Oka, MD  ibuprofen (MOTRIN IB) 200 MG tablet Take 4  tablets (800 mg total) by mouth every 8 (eight) hours as needed for moderate pain. 12/08/13   Albin Felling, MD  meloxicam (MOBIC) 7.5 MG tablet Take 1 tablet (7.5 mg total) by mouth daily. 07/02/14   Janne Napoleon, NP  mometasone-formoterol [facility]) 200-5 MCG/ACT AERO 2 puffs then rinse mouth, twice daily 12/31/13   Deneise Lever, MD  omeprazole (PRILOSEC) 20 MG capsule Take 20 mg by mouth 2 (two) times daily before a meal.    Historical Provider, MD  predniSONE (DELTASONE) 10 MG tablet Take 1 tablet (10 mg total) by mouth daily with breakfast. 06/07/14   Deneise Lever, MD  traMADol (ULTRAM) 50 MG tablet 1-2 tabs po q 6 hr prn pain  Maximum dose= 8 tablets per day 07/02/14   Janne Napoleon, NP  zolpidem (AMBIEN) 10 MG tablet Take 10 mg by mouth at bedtime as needed for sleep.    Historical Provider, MD   BP 191/127 mmHg  Pulse 88  Temp(Src) 97 F (36.1 C) (Oral)  Resp 26  SpO2 97% Physical Exam  Constitutional: She is oriented to person, place, and time. She appears well-developed and well-nourished. No distress.  HENT:  Head: Normocephalic and atraumatic.  Eyes: EOM are normal. Pupils are equal, round, and reactive to light.  Neck: Normal range of motion. Neck supple.  Pulmonary/Chest: Effort normal. No respiratory distress.  Musculoskeletal:  Tenderness to the right back from the scapula to the para lumbosacral musculature. Light palpation produces pain. There is no deformity or discoloration over the spine or elsewhere.  Left lower leg with tenderness with light palpation to the anterior lower leg. Palpation of the scan adipose layer only produces pain. She is able to stand, bear full weight and ambulate. No posterior leg or calf pain or swelling. Distal neurovascular motor sensory is intact. Left pedal pulse 2+.  Neurological: She is alert and oriented to person, place, and time. No cranial nerve deficit.  Skin: Skin is warm and dry.  Nursing note and vitals reviewed.   ED Course  Procedures (including critical care time) Labs Review Labs Reviewed - No data to display  Imaging Review No results found.   MDM   1. Fall, initial encounter   2. Right-sided back pain, unspecified location   3. Leg pain, anterior, left    Use ice to back and leg for 2-3 days then go to heat. Instructions for back and leg pain. F/U with your PCP  Meloxicam 7.5 m Tramadol 50 mg #20    Janne Napoleon, NP 07/02/14 1325

## 2014-07-02 NOTE — Discharge Instructions (Signed)
Back Pain, Adult Use ice to back and leg for 2-3 days then go to heat. Back pain is very common. The pain often gets better over time. The cause of back pain is usually not dangerous. Most people can learn to manage their back pain on their own.  HOME CARE   Stay active. Start with short walks on flat ground if you can. Try to walk farther each day.  Do not sit, drive, or stand in one place for more than 30 minutes. Do not stay in bed.  Do not avoid exercise or work. Activity can help your back heal faster.  Be careful when you bend or lift an object. Bend at your knees, keep the object close to you, and do not twist.  Sleep on a firm mattress. Lie on your side, and bend your knees. If you lie on your back, put a pillow under your knees.  Only take medicines as told by your doctor.  Put ice on the injured area.  Put ice in a plastic bag.  Place a towel between your skin and the bag.  Leave the ice on for 15-20 minutes, 03-04 times a day for the first 2 to 3 days. After that, you can switch between ice and heat packs.  Ask your doctor about back exercises or massage.  Avoid feeling anxious or stressed. Find good ways to deal with stress, such as exercise. GET HELP RIGHT AWAY IF:   Your pain does not go away with rest or medicine.  Your pain does not go away in 1 week.  You have new problems.  You do not feel well.  The pain spreads into your legs.  You cannot control when you poop (bowel movement) or pee (urinate).  Your arms or legs feel weak or lose feeling (numbness).  You feel sick to your stomach (nauseous) or throw up (vomit).  You have belly (abdominal) pain.  You feel like you may pass out (faint). MAKE SURE YOU:   Understand these instructions.  Will watch your condition.  Will get help right away if you are not doing well or get worse. Document Released: 10/02/2007 Document Revised: 07/08/2011 Document Reviewed: 08/17/2013 [facility] Patient  Information 2015 [place], [place]. This information is not intended to replace advice given to you by your health care provider. Make sure you discuss any questions you have with your health care provider.  Fall Prevention and Home Safety Falls cause injuries and can affect all age groups. It is possible to use preventive measures to significantly decrease the likelihood of falls. There are many simple measures which can make your home safer and prevent falls. OUTDOORS  Repair cracks and edges of walkways and driveways.  Remove high doorway thresholds.  Trim shrubbery on the main path into your home.  Have good outside lighting.  Clear walkways of tools, rocks, debris, and clutter.  Check that handrails are not broken and are securely fastened. Both sides of steps should have handrails.  Have leaves, snow, and ice cleared regularly.  Use sand or salt on walkways during winter months.  In the garage, clean up grease or oil spills. BATHROOM  Install night lights.  Install grab bars by the toilet and in the tub and shower.  Use non-skid mats or decals in the tub or shower.  Place a plastic non-slip stool in the shower to sit on, if needed.  Keep floors dry and clean up all water on the floor immediately.  Remove soap buildup in  the tub or shower on a regular basis.  Secure bath mats with non-slip, double-sided rug tape.  Remove throw rugs and tripping hazards from the floors. BEDROOMS  Install night lights.  Make sure a bedside light is easy to reach.  Do not use oversized bedding.  Keep a telephone by your bedside.  Have a firm chair with side arms to use for getting dressed.  Remove throw rugs and tripping hazards from the floor. KITCHEN  Keep handles on pots and pans turned toward the center of the stove. Use back burners when possible.  Clean up spills quickly and allow time for drying.  Avoid walking on wet floors.  Avoid hot utensils and  knives.  Position shelves so they are not too high or low.  Place commonly used objects within easy reach.  If necessary, use a sturdy step stool with a grab bar when reaching.  Keep electrical cables out of the way.  Do not use floor polish or wax that makes floors slippery. If you must use wax, use non-skid floor wax.  Remove throw rugs and tripping hazards from the floor. STAIRWAYS  Never leave objects on stairs.  Place handrails on both sides of stairways and use them. Fix any loose handrails. Make sure handrails on both sides of the stairways are as long as the stairs.  Check carpeting to make sure it is firmly attached along stairs. Make repairs to worn or loose carpet promptly.  Avoid placing throw rugs at the top or bottom of stairways, or properly secure the rug with carpet tape to prevent slippage. Get rid of throw rugs, if possible.  Have an electrician put in a light switch at the top and bottom of the stairs. OTHER FALL PREVENTION TIPS  Wear low-heel or rubber-soled shoes that are supportive and fit well. Wear closed toe shoes.  When using a stepladder, make sure it is fully opened and both spreaders are firmly locked. Do not climb a closed stepladder.  Add color or contrast paint or tape to grab bars and handrails in your home. Place contrasting color strips on first and last steps.  Learn and use mobility aids as needed. Install an electrical emergency response system.  Turn on lights to avoid dark areas. Replace light bulbs that burn out immediately. Get light switches that glow.  Arrange furniture to create clear pathways. Keep furniture in the same place.  Firmly attach carpet with non-skid or double-sided tape.  Eliminate uneven floor surfaces.  Select a carpet pattern that does not visually hide the edge of steps.  Be aware of all pets. OTHER HOME SAFETY TIPS  Set the water temperature for 120 F (48.8 C).  Keep emergency numbers on or near the  telephone.  Keep smoke detectors on every level of the home and near sleeping areas. Document Released: 04/05/2002 Document Revised: 10/15/2011 Document Reviewed: 07/05/2011 [facility] Patient Information 2015 [place], [place]. This information is not intended to replace advice given to you by your health care provider. Make sure you discuss any questions you have with your health care provider.  Lumbosacral Strain Lumbosacral strain is a strain of any of the parts that make up your lumbosacral vertebrae. Your lumbosacral vertebrae are the bones that make up the lower third of your backbone. Your lumbosacral vertebrae are held together by muscles and tough, fibrous tissue (ligaments).  CAUSES  A sudden blow to your back can cause lumbosacral strain. Also, anything that causes an excessive stretch of the muscles in the  low back can cause this strain. This is typically seen when people exert themselves strenuously, fall, lift heavy objects, bend, or crouch repeatedly. RISK FACTORS  Physically demanding work.  Participation in pushing or pulling sports or sports that require a sudden twist of the back (tennis, golf, baseball).  Weight lifting.  Excessive lower back curvature.  Forward-tilted pelvis.  Weak back or abdominal muscles or both.  Tight hamstrings. SIGNS AND SYMPTOMS  Lumbosacral strain may cause pain in the area of your injury or pain that moves (radiates) down your leg.  DIAGNOSIS Your health care provider can often diagnose lumbosacral strain through a physical exam. In some cases, you may need tests such as X-ray exams.  TREATMENT  Treatment for your lower back injury depends on many factors that your clinician will have to evaluate. However, most treatment will include the use of anti-inflammatory medicines. HOME CARE INSTRUCTIONS   Avoid hard physical activities (tennis, racquetball, waterskiing) if you are not in proper physical condition for it. This may aggravate or  create problems.  If you have a back problem, avoid sports requiring sudden body movements. Swimming and walking are generally safer activities.  Maintain good posture.  Maintain a healthy weight.  For acute conditions, you may put ice on the injured area.  Put ice in a plastic bag.  Place a towel between your skin and the bag.  Leave the ice on for 20 minutes, 2-3 times a day.  When the low back starts healing, stretching and strengthening exercises may be recommended. SEEK MEDICAL CARE IF:  Your back pain is getting worse.  You experience severe back pain not relieved with medicines. SEEK IMMEDIATE MEDICAL CARE IF:   You have numbness, tingling, weakness, or problems with the use of your arms or legs.  There is a change in bowel or bladder control.  You have increasing pain in any area of the body, including your belly (abdomen).  You notice shortness of breath, dizziness, or feel faint.  You feel sick to your stomach (nauseous), are throwing up (vomiting), or become sweaty.  You notice discoloration of your toes or legs, or your feet get very cold. MAKE SURE YOU:   Understand these instructions.  Will watch your condition.  Will get help right away if you are not doing well or get worse. Document Released: 01/23/2005 Document Revised: 04/20/2013 Document Reviewed: 12/02/2012 [facility] Patient Information 2015 [place], [place]. This information is not intended to replace advice given to you by your health care provider. Make sure you discuss any questions you have with your health care provider.

## 2014-07-02 NOTE — ED Notes (Signed)
States she fell yesterday, and since then has had pain in her low back , left leg

## 2014-07-07 ENCOUNTER — Encounter: Payer: Self-pay | Admitting: *Deleted

## 2014-07-12 ENCOUNTER — Telehealth: Payer: Self-pay | Admitting: Internal Medicine

## 2014-07-12 NOTE — Telephone Encounter (Signed)
I faxed the infusion form 07/11/14. Pharm. Called today to set up ship. For 07/13/14. I called Ms. Bread to ask her to reschedule her appt. For later that afternoon(3:30 p.m.), Her previous appt. Was at 9:00 a.m.. Closing note nothing further needed.

## 2014-07-13 ENCOUNTER — Ambulatory Visit: Payer: Self-pay

## 2014-07-14 ENCOUNTER — Other Ambulatory Visit: Payer: Self-pay | Admitting: Internal Medicine

## 2014-07-14 ENCOUNTER — Ambulatory Visit (INDEPENDENT_AMBULATORY_CARE_PROVIDER_SITE_OTHER): Payer: Self-pay

## 2014-07-14 DIAGNOSIS — J4540 Moderate persistent asthma, uncomplicated: Secondary | ICD-10-CM

## 2014-07-14 MED ORDER — OMALIZUMAB 150 MG ~~LOC~~ SOLR
225.0000 mg | Freq: Once | SUBCUTANEOUS | Status: AC
  Administered 2014-07-14: 225 mg via SUBCUTANEOUS

## 2014-07-15 ENCOUNTER — Telehealth: Payer: Self-pay | Admitting: Internal Medicine

## 2014-07-15 MED ORDER — PREDNISONE 10 MG PO TABS: 10.0000 mg | ORAL_TABLET | Freq: Every day | ORAL | Status: DC

## 2014-07-15 NOTE — Telephone Encounter (Signed)
Called and spoke with pt and she stated that her allergies have been acting up.  Pt stated that she did get her xolair yesterday but this did not seem to help.  She also is wanting to get a refill of the prednisone.  Pt is requesting recs from CY about her allergies?  Anything else that she may be able to do?  CY please advise. Thanks  Last ov--03/2014 Next ov--no pending appts with CY  No Known Allergies  Current Outpatient Prescriptions on File Prior to Visit  Medication Sig Dispense Refill  . albuterol (PROAIR HFA) 108 (90 BASE) MCG/ACT inhaler Inhale 2 puffs into the lungs every 4 (four) hours as needed for wheezing or shortness of breath. 8.5 g 2  . albuterol (PROVENTIL) (2.5 MG/3ML) 0.083% nebulizer solution Take 3 mLs (2.5 mg total) by nebulization every 4 (four) hours as needed for wheezing or shortness of breath. 30 vial 11  . cyclobenzaprine (FLEXERIL) 5 MG tablet Take 1 tablet (5 mg total) by mouth 3 (three) times daily as needed for muscle spasms. 30 tablet 0  . hydrochlorothiazide (MICROZIDE) 12.5 MG capsule Take 1 capsule (12.5 mg total) by mouth daily. 30 capsule 6  . ibuprofen (MOTRIN IB) 200 MG tablet Take 4 tablets (800 mg total) by mouth every 8 (eight) hours as needed for moderate pain. 30 tablet 1  . meloxicam (MOBIC) 7.5 MG tablet Take 1 tablet (7.5 mg total) by mouth daily. 12 tablet 0  . mometasone-formoterol (DULERA) 200-5 MCG/ACT AERO 2 puffs then rinse mouth, twice daily 1 Inhaler prn  . omeprazole (PRILOSEC) 20 MG capsule Take 20 mg by mouth 2 (two) times daily before a meal.    . predniSONE (DELTASONE) 10 MG tablet Take 1 tablet (10 mg total) by mouth daily with breakfast. 30 tablet 2  . traMADol (ULTRAM) 50 MG tablet 1-2 tabs po q 6 hr prn pain Maximum dose= 8 tablets per day 20 tablet 0  . zolpidem (AMBIEN) 10 MG tablet Take 10 mg by mouth at bedtime as needed for sleep.     No current facility-administered medications on file prior to visit.

## 2014-07-15 NOTE — Telephone Encounter (Signed)
lmtcb for pt.   Refilled prednisone to [facility] on [address]

## 2014-07-15 NOTE — Telephone Encounter (Signed)
Ok to refill her prednisone For allergies use otc antihistamine like loratadine/ Claritin, and otc nasal steroid spray fluticasone/ Flonase

## 2014-07-18 NOTE — Telephone Encounter (Signed)
LMTCB x2  

## 2014-07-18 NOTE — Telephone Encounter (Signed)
Pt aware that Rx sent to pharmacy.  Appt scheduled per pt request to see CY 07/19/14 Nothing further needed.

## 2014-07-19 ENCOUNTER — Ambulatory Visit: Payer: Self-pay | Admitting: Internal Medicine

## 2014-07-22 ENCOUNTER — Encounter (HOSPITAL_COMMUNITY): Payer: Self-pay | Admitting: Emergency Medicine

## 2014-07-22 ENCOUNTER — Emergency Department (HOSPITAL_COMMUNITY)
Admission: EM | Admit: 2014-07-22 | Discharge: 2014-07-22 | Disposition: A | Discharge status: Home / Self Care | Payer: Self-pay | Attending: Emergency Medicine | Admitting: Emergency Medicine

## 2014-07-22 DIAGNOSIS — K219 Gastro-esophageal reflux disease without esophagitis: Secondary | ICD-10-CM | POA: Insufficient documentation

## 2014-07-22 DIAGNOSIS — Z87891 Personal history of nicotine dependence: Secondary | ICD-10-CM | POA: Insufficient documentation

## 2014-07-22 DIAGNOSIS — I10 Essential (primary) hypertension: Secondary | ICD-10-CM | POA: Insufficient documentation

## 2014-07-22 DIAGNOSIS — Z791 Long term (current) use of non-steroidal anti-inflammatories (NSAID): Secondary | ICD-10-CM | POA: Insufficient documentation

## 2014-07-22 DIAGNOSIS — J45901 Unspecified asthma with (acute) exacerbation: Secondary | ICD-10-CM

## 2014-07-22 DIAGNOSIS — Z8619 Personal history of other infectious and parasitic diseases: Secondary | ICD-10-CM | POA: Insufficient documentation

## 2014-07-22 DIAGNOSIS — Z7952 Long term (current) use of systemic steroids: Secondary | ICD-10-CM | POA: Insufficient documentation

## 2014-07-22 DIAGNOSIS — G4700 Insomnia, unspecified: Secondary | ICD-10-CM | POA: Insufficient documentation

## 2014-07-22 DIAGNOSIS — F329 Major depressive disorder, single episode, unspecified: Secondary | ICD-10-CM | POA: Insufficient documentation

## 2014-07-22 DIAGNOSIS — Z8742 Personal history of other diseases of the female genital tract: Secondary | ICD-10-CM | POA: Insufficient documentation

## 2014-07-22 DIAGNOSIS — Z79899 Other long term (current) drug therapy: Secondary | ICD-10-CM | POA: Insufficient documentation

## 2014-07-22 DIAGNOSIS — Z7951 Long term (current) use of inhaled steroids: Secondary | ICD-10-CM | POA: Insufficient documentation

## 2014-07-22 DIAGNOSIS — Z872 Personal history of diseases of the skin and subcutaneous tissue: Secondary | ICD-10-CM | POA: Insufficient documentation

## 2014-07-22 DIAGNOSIS — J441 Chronic obstructive pulmonary disease with (acute) exacerbation: Secondary | ICD-10-CM | POA: Insufficient documentation

## 2014-07-22 HISTORY — DX: Other seasonal allergic rhinitis: J30.2

## 2014-07-22 MED ORDER — ALBUTEROL SULFATE HFA 108 (90 BASE) MCG/ACT IN AERS
2.0000 | INHALATION_SPRAY | RESPIRATORY_TRACT | Status: AC
  Administered 2014-07-22: 2 via RESPIRATORY_TRACT
  Filled 2014-07-22: qty 6.7

## 2014-07-22 MED ORDER — ALBUTEROL SULFATE (2.5 MG/3ML) 0.083% IN NEBU
5.0000 mg | INHALATION_SOLUTION | Freq: Once | RESPIRATORY_TRACT | Status: AC
  Administered 2014-07-22: 5 mg via RESPIRATORY_TRACT
  Filled 2014-07-22: qty 6

## 2014-07-22 MED ORDER — ALBUTEROL SULFATE (2.5 MG/3ML) 0.083% IN NEBU: 5.0000 mg | INHALATION_SOLUTION | Freq: Once | RESPIRATORY_TRACT | Status: DC

## 2014-07-22 MED ORDER — ALBUTEROL (5 MG/ML) CONTINUOUS INHALATION SOLN
10.0000 mg/h | INHALATION_SOLUTION | Freq: Once | RESPIRATORY_TRACT | Status: DC
  Filled 2014-07-22: qty 20

## 2014-07-22 MED ORDER — IPRATROPIUM-ALBUTEROL 0.5-2.5 (3) MG/3ML IN SOLN: 3.0000 mL | Freq: Once | RESPIRATORY_TRACT | Status: DC

## 2014-07-22 MED ORDER — ALBUTEROL SULFATE (2.5 MG/3ML) 0.083% IN NEBU
2.5000 mg | INHALATION_SOLUTION | Freq: Once | RESPIRATORY_TRACT | Status: AC
  Administered 2014-07-22: 2.5 mg via RESPIRATORY_TRACT

## 2014-07-22 MED ORDER — IPRATROPIUM BROMIDE 0.02 % IN SOLN
1.0000 mg | Freq: Once | RESPIRATORY_TRACT | Status: DC
  Administered 2014-07-22: 1 mg via RESPIRATORY_TRACT
  Filled 2014-07-22: qty 5

## 2014-07-22 MED ORDER — ALBUTEROL SULFATE (2.5 MG/3ML) 0.083% IN NEBU: 2.5000 mg | INHALATION_SOLUTION | Freq: Once | RESPIRATORY_TRACT | Status: DC

## 2014-07-22 MED ORDER — IPRATROPIUM BROMIDE 0.02 % IN SOLN
0.5000 mg | Freq: Once | RESPIRATORY_TRACT | Status: AC
  Administered 2014-07-22: 0.5 mg via RESPIRATORY_TRACT
  Filled 2014-07-22: qty 2.5

## 2014-07-22 MED ORDER — PREDNISONE 20 MG PO TABS: 40.0000 mg | ORAL_TABLET | Freq: Every day | ORAL | Status: DC

## 2014-07-22 MED ORDER — PREDNISONE 20 MG PO TABS
60.0000 mg | ORAL_TABLET | Freq: Once | ORAL | Status: AC
  Administered 2014-07-22: 60 mg via ORAL
  Filled 2014-07-22: qty 3

## 2014-07-22 NOTE — ED Notes (Signed)
Pt refusing tx.

## 2014-07-22 NOTE — ED Notes (Signed)
Patient states asthma "has gotten bad".  Patient states worked last night and came home having wheezing and cough.  Patient states she has a nebulizer and inhaler at home, but it didn't help as much as it usually does.

## 2014-07-22 NOTE — ED Provider Notes (Signed)
CSN: [id number]     Arrival date & time 07/22/14  7062 History   First MD Initiated Contact with Patient 07/22/14 [phone]     Chief Complaint  Patient presents with  . Asthma      HPI  Pt was seen at 0800.  Per pt, c/o gradual onset and worsening of persistent cough and wheezing for the past 1 week. Pt states she got off of work this morning, used her MDI and felt "it didn't work as good as it usually does," so she came to the ED for evaluation. Has not used her nebulizer. Describes her symptoms as "my asthma and allergies are acting up."  Denies CP/palpitations, no back pain, no abd pain, no N/V/D, no fevers, no rash.    Past Medical History  Diagnosis Date  . Acanthosis nigricans   . Menorrhagia   . Hypertension, essential   . Insomnia   . Morbid obesity   . Helicobacter pylori (H. pylori) infection   . Sleep apnea     Sleep study 2008 - mild OSA, not enough events to titrate CPAP  . COPD (chronic obstructive pulmonary disease)     PFTs in 2002, FEV1/FVC 65, no post bronchodilater test done  . Asthma     Followed by Dr. Annamaria Boots (pulmonology); receives every other week omalizumab injections; has frequent exacerbations  . Depression   . GERD (gastroesophageal reflux disease)   . Tobacco user   . Obesity   . Shortness of breath   . Headache(784.0)   . Seasonal allergies    Past Surgical History  Procedure Laterality Date  . Tubal ligation  1996    bilateral  . Breast reduction surgery  09/2011   Family History  Problem Relation Age of Onset  . Asthma Daughter   . Cancer Paternal Aunt   . Asthma Maternal Grandmother   . Hypertension Mother    History  Substance Use Topics  . Smoking status: Former Smoker -- 18 years    Quit date: 10/04/2012  . Smokeless tobacco: Never Used  . Alcohol Use: No    Review of Systems ROS: Statement: All systems negative except as marked or noted in the HPI; Constitutional: Negative for fever and chills. ; ; Eyes: Negative for eye pain,  redness and discharge. ; ; ENMT: Negative for ear pain, hoarseness, nasal congestion, sinus pressure and sore throat. ; ; Cardiovascular: Negative for chest pain, palpitations, diaphoresis, dyspnea and peripheral edema. ; ; Respiratory: +cough, wheezing. Negative for stridor. ; ; Gastrointestinal: Negative for nausea, vomiting, diarrhea, abdominal pain, blood in stool, hematemesis, jaundice and rectal bleeding. . ; ; Genitourinary: Negative for dysuria, flank pain and hematuria. ; ; Musculoskeletal: Negative for back pain and neck pain. Negative for swelling and trauma.; ; Skin: Negative for pruritus, rash, abrasions, blisters, bruising and skin lesion.; ; Neuro: Negative for headache, lightheadedness and neck stiffness. Negative for weakness, altered level of consciousness , altered mental status, extremity weakness, paresthesias, involuntary movement, seizure and syncope.      Allergies  Review of patient's allergies indicates no known allergies.  Home Medications   Prior to Admission medications   Medication Sig Start Date End Date Taking? Authorizing Provider  albuterol (PROAIR HFA) 108 (90 BASE) MCG/ACT inhaler Inhale 2 puffs into the lungs every 4 (four) hours as needed for wheezing or shortness of breath. 09/15/13   Bertha Stakes, MD  albuterol (PROVENTIL) (2.5 MG/3ML) 0.083% nebulizer solution Take 3 mLs (2.5 mg total) by nebulization every 4 (four)  hours as needed for wheezing or shortness of breath. 06/09/13   Pollie Friar, MD  cyclobenzaprine (FLEXERIL) 5 MG tablet Take 1 tablet (5 mg total) by mouth 3 (three) times daily as needed for muscle spasms. 01/27/14   Billy Fischer, MD  hydrochlorothiazide (MICROZIDE) 12.5 MG capsule Take 1 capsule (12.5 mg total) by mouth daily. 03/28/14 03/28/15  [name], MD  ibuprofen (MOTRIN IB) 200 MG tablet Take 4 tablets (800 mg total) by mouth every 8 (eight) hours as needed for moderate pain. 12/08/13   Carly Montey Hora, MD  meloxicam (MOBIC) 7.5 MG  tablet Take 1 tablet (7.5 mg total) by mouth daily. 07/02/14   Janne Napoleon, NP  mometasone-formoterol [facility]) 200-5 MCG/ACT AERO 2 puffs then rinse mouth, twice daily 12/31/13   Deneise Lever, MD  omeprazole (PRILOSEC) 20 MG capsule Take 20 mg by mouth 2 (two) times daily before a meal.    Historical Provider, MD  predniSONE (DELTASONE) 10 MG tablet Take 1 tablet (10 mg total) by mouth daily with breakfast. 07/15/14   Deneise Lever, MD  traMADol (ULTRAM) 50 MG tablet 1-2 tabs po q 6 hr prn pain Maximum dose= 8 tablets per day 07/02/14   Janne Napoleon, NP  zolpidem (AMBIEN) 10 MG tablet Take 10 mg by mouth at bedtime as needed for sleep.    Historical Provider, MD   BP 156/82 mmHg  Pulse 91  Temp(Src) 99.3 F (37.4 C) (Oral)  Resp 18  Ht 5\' 6"  (1.676 m)  Wt 340 lb (154.223 kg)  BMI 54.90 kg/m2  SpO2 98%  LMP 07/02/2014 Physical Exam  0805: Physical examination:  Nursing notes reviewed; Vital signs and O2 SAT reviewed;  Constitutional: Well developed, Well nourished, Well hydrated, In no acute distress; Head:  Normocephalic, atraumatic; Eyes: EOMI, PERRL, No scleral icterus; ENMT: TM's clear bilat. +edemetous nasal turbinates bilat with clear rhinorrhea. Mouth and pharynx normal, Mucous membranes moist; Neck: Supple, Full range of motion, No lymphadenopathy; Cardiovascular: Regular rate and rhythm, No murmur, rub, or gallop; Respiratory: Breath sounds clear & equal bilaterally, faint scattered wheezes. No audible wheezing. Speaking full sentences with ease, Normal respiratory effort/excursion; Chest: Nontender, Movement normal; Abdomen: Soft, Nontender, Nondistended, Normal bowel sounds; Genitourinary: No CVA tenderness; Extremities: Pulses normal, No tenderness, No edema, No calf edema or asymmetry.; Neuro: AA&Ox3, Major CN grossly intact.  Speech clear. No gross focal motor or sensory deficits in extremities.; Skin: Color normal, Warm, Dry.   ED Course  Procedures     EKG Interpretation None       MDM  MDM Reviewed: previous chart, nursing note and vitals      0925:  Short neb and PO steroid given. Lungs coarse, no audible wheezing, resps easy, NAD. Pt ambulated with O2 Sat dropping to 95% then 89% R/A. Pt does not want an hour long neb "because I'll get all shakey." Agreeable to another short neb. Pt also requesting a refill of her MDI.    1115:  Short neb #2 given. Pt states she "feels better" after 2nd neb and wants to go home now.  NAD, lungs CTA bilat, no wheezing, resps easy, speaking full sentences, Sats 98% R/A.  Pt ambulated around the ED with Sats remaining 95-97 % R/A, resps easy, NAD.  Dx d/w pt.  Questions answered.  Verb understanding, agreeable to d/c home with outpt f/u.    Francine Graven, DO 07/25/14 0102

## 2014-07-22 NOTE — ED Notes (Signed)
Spoke with Dr. Thurnell Garbe, Ambulated Patient.  While ambulating Patient 02 Stats starting out at 98% RA dropped downed to 87%  Going around the [place] a 2nd time.

## 2014-07-22 NOTE — ED Notes (Signed)
RT at bedside.

## 2014-07-22 NOTE — ED Notes (Signed)
Ambulated patient ,pulse Oximetry 95%-97% while ambulating

## 2014-07-22 NOTE — Discharge Instructions (Signed)
°Emergency Department Resource Guide °1) Find a Doctor and Pay Out of Pocket °Although you won't have to find out who is covered by your insurance plan, it is a good idea to ask around and get recommendations. You will then need to call the office and see if the doctor you have chosen will accept you as a new patient and what types of options they offer for patients who are self-pay. Some doctors offer discounts or will set up payment plans for their patients who do not have insurance, but you will need to ask so you aren't surprised when you get to your appointment. ° °2) Contact Your Local Health Department °Not all health departments have doctors that can see patients for sick visits, but many do, so it is worth a call to see if yours does. If you don't know where your local health department is, you can check in your phone book. The CDC also has a tool to help you locate your state's health department, and many state websites also have listings of all of their local health departments. ° °3) Find a Walk-in Clinic °If your illness is not likely to be very severe or complicated, you may want to try a walk in clinic. These are popping up all over the country in pharmacies, drugstores, and shopping centers. They're usually staffed by nurse practitioners or physician assistants that have been trained to treat common illnesses and complaints. They're usually fairly quick and inexpensive. However, if you have serious medical issues or chronic medical problems, these are probably not your best option. ° °No Primary Care Doctor: °- Call [facility] Connect at  832-8000 - they can help you locate a primary care doctor that  accepts your insurance, provides certain services, etc. °- Physician Referral Service- 1-800-533-3463 ° °Chronic Pain Problems: °Organization         Address  Phone   Notes  °[facility]  (336) [phone] Patients need to be referred by their primary care doctor.  ° °Medication  Assistance: °Organization         Address  Phone   Notes  °[place] [facility] [address] °[place], [place] [address] (336) 641-8030 --Must be a resident of [place] °-- Must have NO insurance coverage whatsoever (no Medicaid/ Medicare, etc.) °-- The pt. MUST have a primary care doctor that directs their care regularly and follows them in the community °  °[facility]  (866) 331-1348   °[employer]  (888) 892-1162   ° °Agencies that provide inexpensive medical care: °Organization         Address  Phone   Notes  °[facility]  (336) 832-8035   °[facility]    (336) 832-7272   °[facility] [address] °[place], [place] [address] (336) 832-4777   °[facility] [address], °[place] (336) 271-4999   °[facility]    (336) 373-0678   °[facility]    (336) 272-1050   °[facility] ° [address], [place] Phone:  (336) 832-4444, Fax:  (336) 832-4440 Hours of Operation:  9 am - 6 pm, M-F.  Also accepts Medicaid/Medicare and self-pay.  °[facility] ° [address], [place] Phone: (336) [phone], Fax: (336) [phone]. Hours of Operation:  8:30 am - 5:30 pm, M-F.  Also accepts Medicaid and self-pay.  °[facility] [place] [address], [place] Phone: (336) 878-6027   °[facility] [address], [place], [place] (336)723-1848, Ext. 123 Mondays & Thursdays: 7-9 AM.  First 15 patients are seen on a first come, first serve basis. °  ° °Medicaid-accepting [place] Providers: ° °Organization         Address  Phone   Notes  °[facility] [address], [place] (336) 641-2100 Also accepts self-pay patients.  °[facility] [address], [place] ° (336) 856-9996   °[facility] [address], [place]  (336) 288-8857   °[facility] [address] [place] [address], [place] (336) 299-7000   °Ve[facility] Bland [address], [place]  ° (336) 373-1557 Only accepts [place] Access Medicaid patients after they have their name applied to their card.  ° °Self-Pay (no insurance) in [place]: ° °Organization         Address  Phone   Notes  °Sickle Cell Patients, [facility] [address], [place] (336) 832-1970   °[facility] [address], [place] (336) 832-4400   °[facility] [place] ° [address], [place] (336) 992-4800   °[facility]/Dr. Osei-Bonsu ° [address] [place] [address], [place] or [address], [place] (336) [phone] Phone number for both [place] and [place] locations is the same.  °[facility] [address], [place] (336) 299-0000   °[facility] [address] [place] [address], [place] or [address] (336) 852-7530 °(336) 878-2260   °[facility] [address], [place] (336) 350-1642, phone; (336) 294-5005, fax Sees patients 1st and 3rd Saturday of every month.  Must not qualify for public or private insurance (i.e. Medicaid, Medicare, [place] Health Choice, Veterans' Benefits) • Household income should be no more than 200% of the poverty level •The clinic cannot treat you if you are pregnant or think you are pregnant • Sexually transmitted diseases are not treated at the clinic.  ° ° °Dental Care: °Organization         Address  Phone  Notes  °[place] [facility] [address], [place] (336) 641-6152 Accepts children up to age 21 who are enrolled in Medicaid or [place] Health Choice; pregnant women with a Medicaid card; and children who have applied for Medicaid or [place] Health Choice, but were declined, whose parents can pay a reduced fee at time of service.  °[place]  [facility] [place]  [address], [place] (336) 641-7733 Accepts children up to age 21 who are enrolled in Medicaid or [place] Health Choice; pregnant women with a Medicaid card; and children who have applied for Medicaid or [place] Health Choice, but were declined, whose parents can pay a reduced fee at time of service.  °[facility] ° [address],  (336) 641-4533 Patients are seen by appointment only. Walk-ins are not accepted. [facility] will see patients 18 years of age and older. °Monday - Tuesday (8am-5pm) °Most Wednesdays (8:30-5pm) °$30 per visit, cash only  °[facility] ° [address], [place] (336) 641-4533 Patients are seen by appointment only. Walk-ins are not accepted. [facility] will see patients 18 years of age and older. °One   Wednesday Evening (Monthly: Volunteer Based).  $30 per visit, cash only  °[facility]  (919) [phone] for adults; Children under age 4, call Graduate Pediatric Dentistry at ([phone]) [phone]. Children aged 4-14, please call ([phone]) [phone] to request a pediatric application. ° Dental services are provided in all areas of dental care including fillings, crowns and bridges, complete and partial dentures, implants, gum treatment, root canals, and extractions. Preventive care is also provided. Treatment is provided to both adults and children. °Patients are selected via a lottery and there is often a waiting list. °  °[facility] [address], °[place] ° (336) 763-8833 [email or web address].[email or web address] °  °[facility] [address], [place], [place] (336)723-1848, Ext. 123 Second and Fourth Thursday of each month, opens at 6:30 AM; Clinic ends at 9 AM.  Patients are seen on a first-come first-served basis, and a limited number are seen during each clinic.  ° °[facility] ° [address], [place], [place] (336) 723-7904    Eligibility Requirements °You must have lived in [place], [place], or [place] counties for at least the last three months. °  You cannot be eligible for state or federal sponsored healthcare insurance, including Veterans Administration, Medicaid, or Medicare. °  You generally cannot be eligible for healthcare insurance through your employer.  °  How to apply: °Eligibility screenings are held every Tuesday and Wednesday afternoon from 1:00 pm until 4:00 pm. You do not need an appointment for the interview!  °[facility] [address], [place], [place] 336-631-2330   °[facility]  336-342-8273   °[facility]  336-703-3100   °[facility]  336-570-6415   ° °Behavioral Health Resources in the Community: °Intensive Outpatient Programs °Organization         Address  Phone  Notes  °[facility] [address], [place], [place] 336-878-6098   °[facility] [address], [place], [place] 336-832-9800   °[facility] [address], [place], [place] ° 336-882-2125   °[facility] [address],  °[place], [place] 1-800-853-5163 or 336-641-4981   °Substance Abuse Resources °Organization         Address  Phone  Notes  °[facility]  336-882-2125   °[facility]  336-784-9470   °[facility]  336-285-9073   °[facility]  336-845-3988   °Residential & Outpatient Substance Abuse Program  1-800-659-3381   °Psychological Services °Organization         Address  Phone  Notes  °[facility]  336- 832-9600   °[facility]  336- 378-7881   °[facility] [address], [place] 1-800-853-5163 or 336-641-4981   ° °Mobile Crisis Teams °Organization         Address  Phone  Notes  °[facility], Mobile Crisis Care Unit  1-877-626-1772   °Assertive °[facility] ° [address]  [place], [place] 336-834-9664   °Sharon DeEsch [address] °[place] 336-554-5454   ° °Self-Help/Support Groups °Organization         Address  Phone             Notes  °[facility] [place] - variety of support groups  336- 373-1402 Call for more information  °Narcotics Anonymous (NA), [facility] [address], °[place]  2 meetings at this location  ° °  Residential Treatment Programs Organization         Address  Phone  Notes  [facility] [address],    [place]  1-(303)596-6744   [facility]  [address] [place] [address], [place], [place]   Bliss [place], [place] (605) 234-9765 Admissions: 8am-3pm M-F  [facility] [place] [address],    [place], [place] 336-122-4497   [facility] [address] [place], [place], [place]   [facility] [address],  [place], [place]   [facility] Intensive Outpatient [address], [place], [place]   [facility] [place]  [place], [place] 1-856-804-8683 or 608-337-6568   [facility] [address] [place], [place] Accepts Medicaid  [facility] [address],  [place] 1-825-680-8514 Substance Abuse/Addiction Treatment   [facility]         Address  Phone  Notes  [facility]  631-738-5554   Domenic Schwab, PhD [address] Arlis Porta [place], [place]   (713)622-1641 or 3167120488   [facility] [place], [place] 910-145-9084   [facility] [address] [place], [place] [phone] Insurance/Medicaid/sponsorship through [facility] [address] [place]                                    [place], [place] [phone] [place] [address], [place] (909)[phone]    Dr. Adele Schilder  ([phone])[phone]   [facility] Dept. 1) [address] [place] 2) [place] 3)  [address], [place] [phone] [phone]  [phone]   [place] 586-042-5647 or [phone] (After Hours)      Take the prescription as directed.  Use your albuterol inhaler (2 to 4 puffs) or your albuterol nebulizer (1 unit dose) every 4 hours for the next 7 days, then as needed for cough, wheezing, or shortness of breath.  Call your regular medical doctor this morning to schedule a follow up appointment within the next 3 days.  Return to the Emergency Department immediately sooner if worsening.

## 2014-07-28 ENCOUNTER — Ambulatory Visit: Payer: Self-pay

## 2014-07-29 ENCOUNTER — Ambulatory Visit (INDEPENDENT_AMBULATORY_CARE_PROVIDER_SITE_OTHER): Payer: Self-pay

## 2014-07-29 ENCOUNTER — Telehealth: Payer: Self-pay | Admitting: Internal Medicine

## 2014-07-29 DIAGNOSIS — J449 Chronic obstructive pulmonary disease, unspecified: Secondary | ICD-10-CM

## 2014-07-29 MED ORDER — OMALIZUMAB 150 MG ~~LOC~~ SOLR
225.0000 mg | Freq: Once | SUBCUTANEOUS | Status: AC
  Administered 2014-07-29: 225 mg via SUBCUTANEOUS

## 2014-07-29 MED ORDER — PREDNISONE 20 MG PO TABS: 10.0000 mg | ORAL_TABLET | Freq: Every day | ORAL | Status: DC

## 2014-07-29 NOTE — Telephone Encounter (Signed)
Spoke with the pt  She needs pred refill and dulera 200 sample  I provided her with 1 sample (not from cabinet) and refilled pred # 30 only  She has rescheduled appt with [facility] for 08/09/14 and I have advised her that she will need to be sure and keep this appt  She verbalized understanding

## 2014-08-02 IMAGING — CR DG CHEST 2V
2 series · 2 of 2 positions shown · non-contrast
Comparison: 06/06/2013

CLINICAL DATA: Shortness of breath, cough, and wheezing for 3 days,
history asthma, hypertension, COPD, former smoker

EXAM:
CHEST  2 VIEW

[w chest pa]
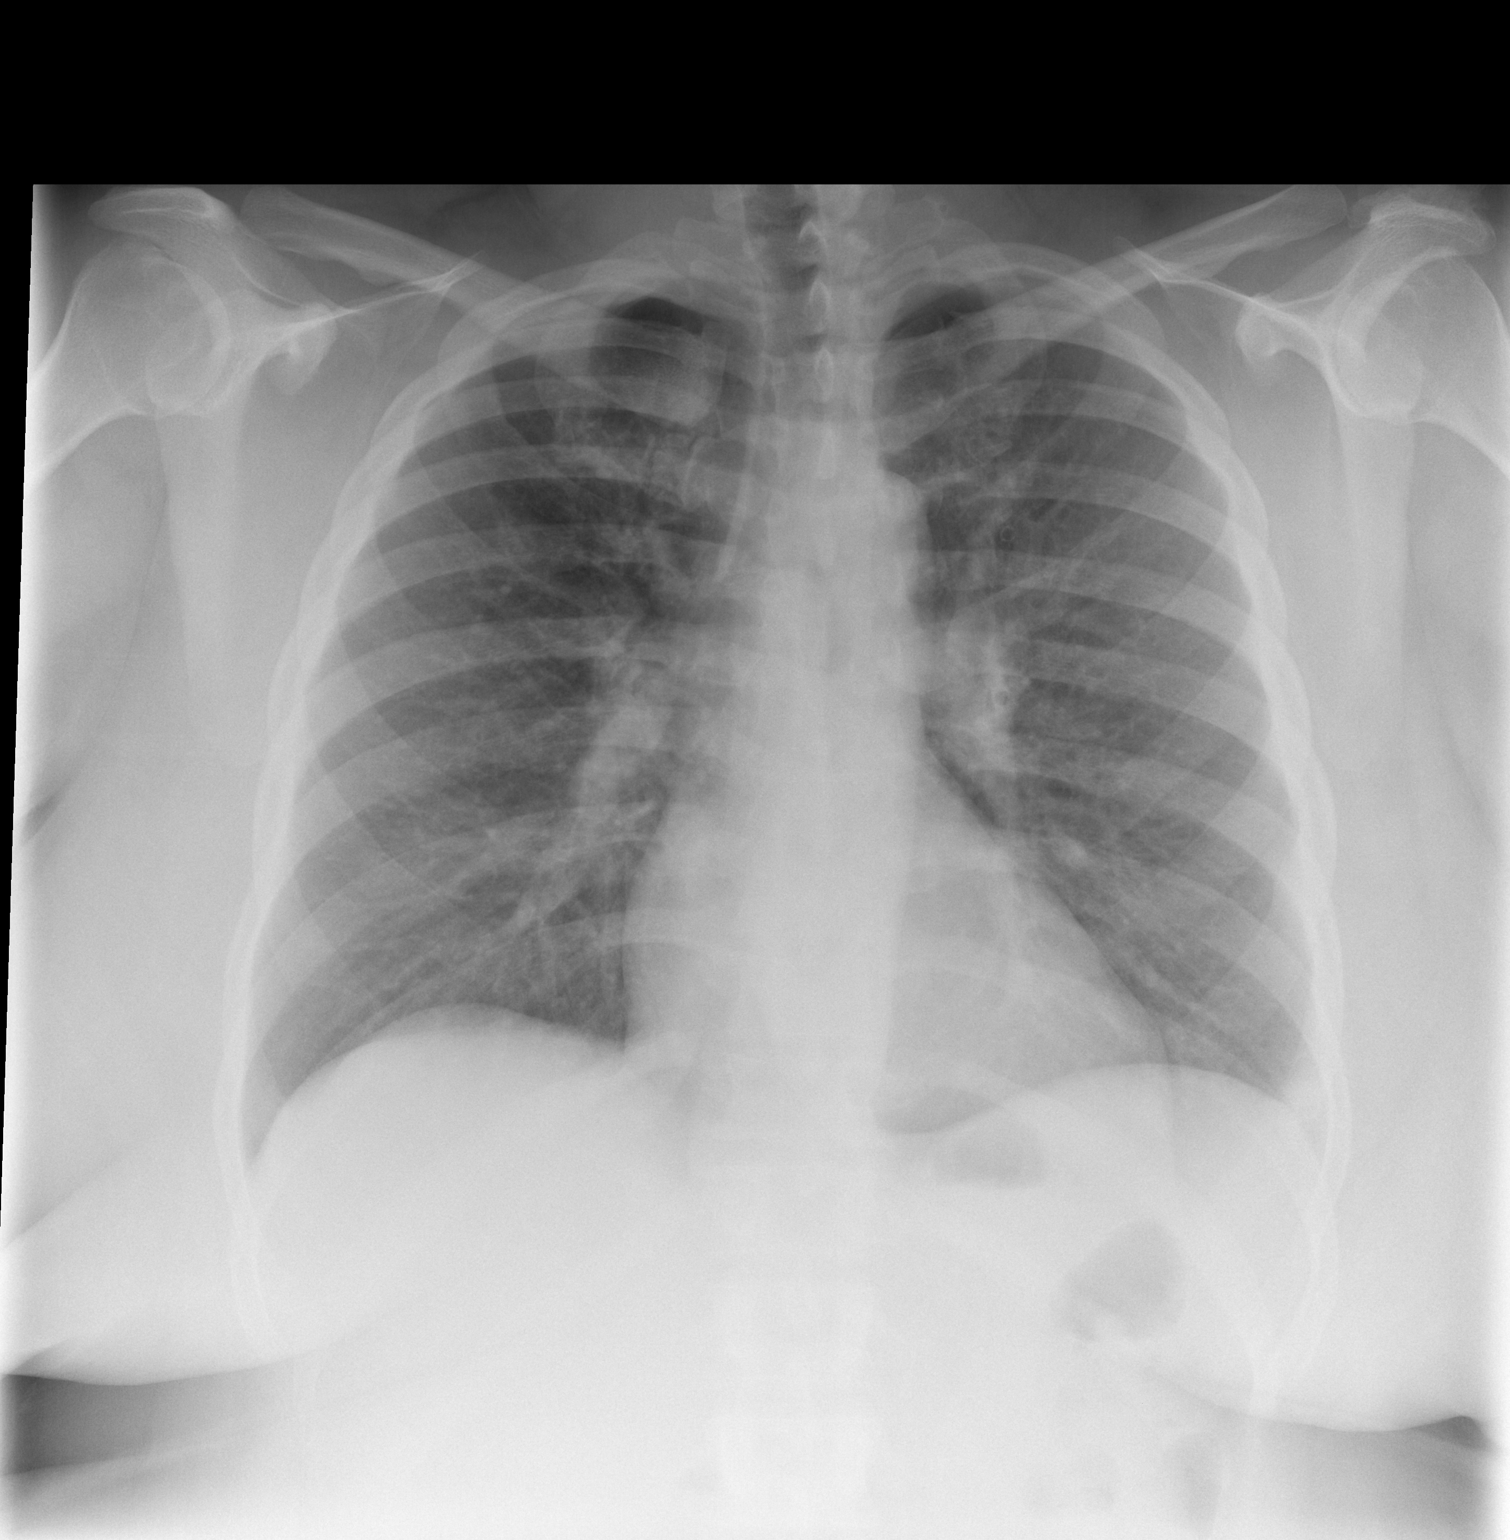

[w chest lat]
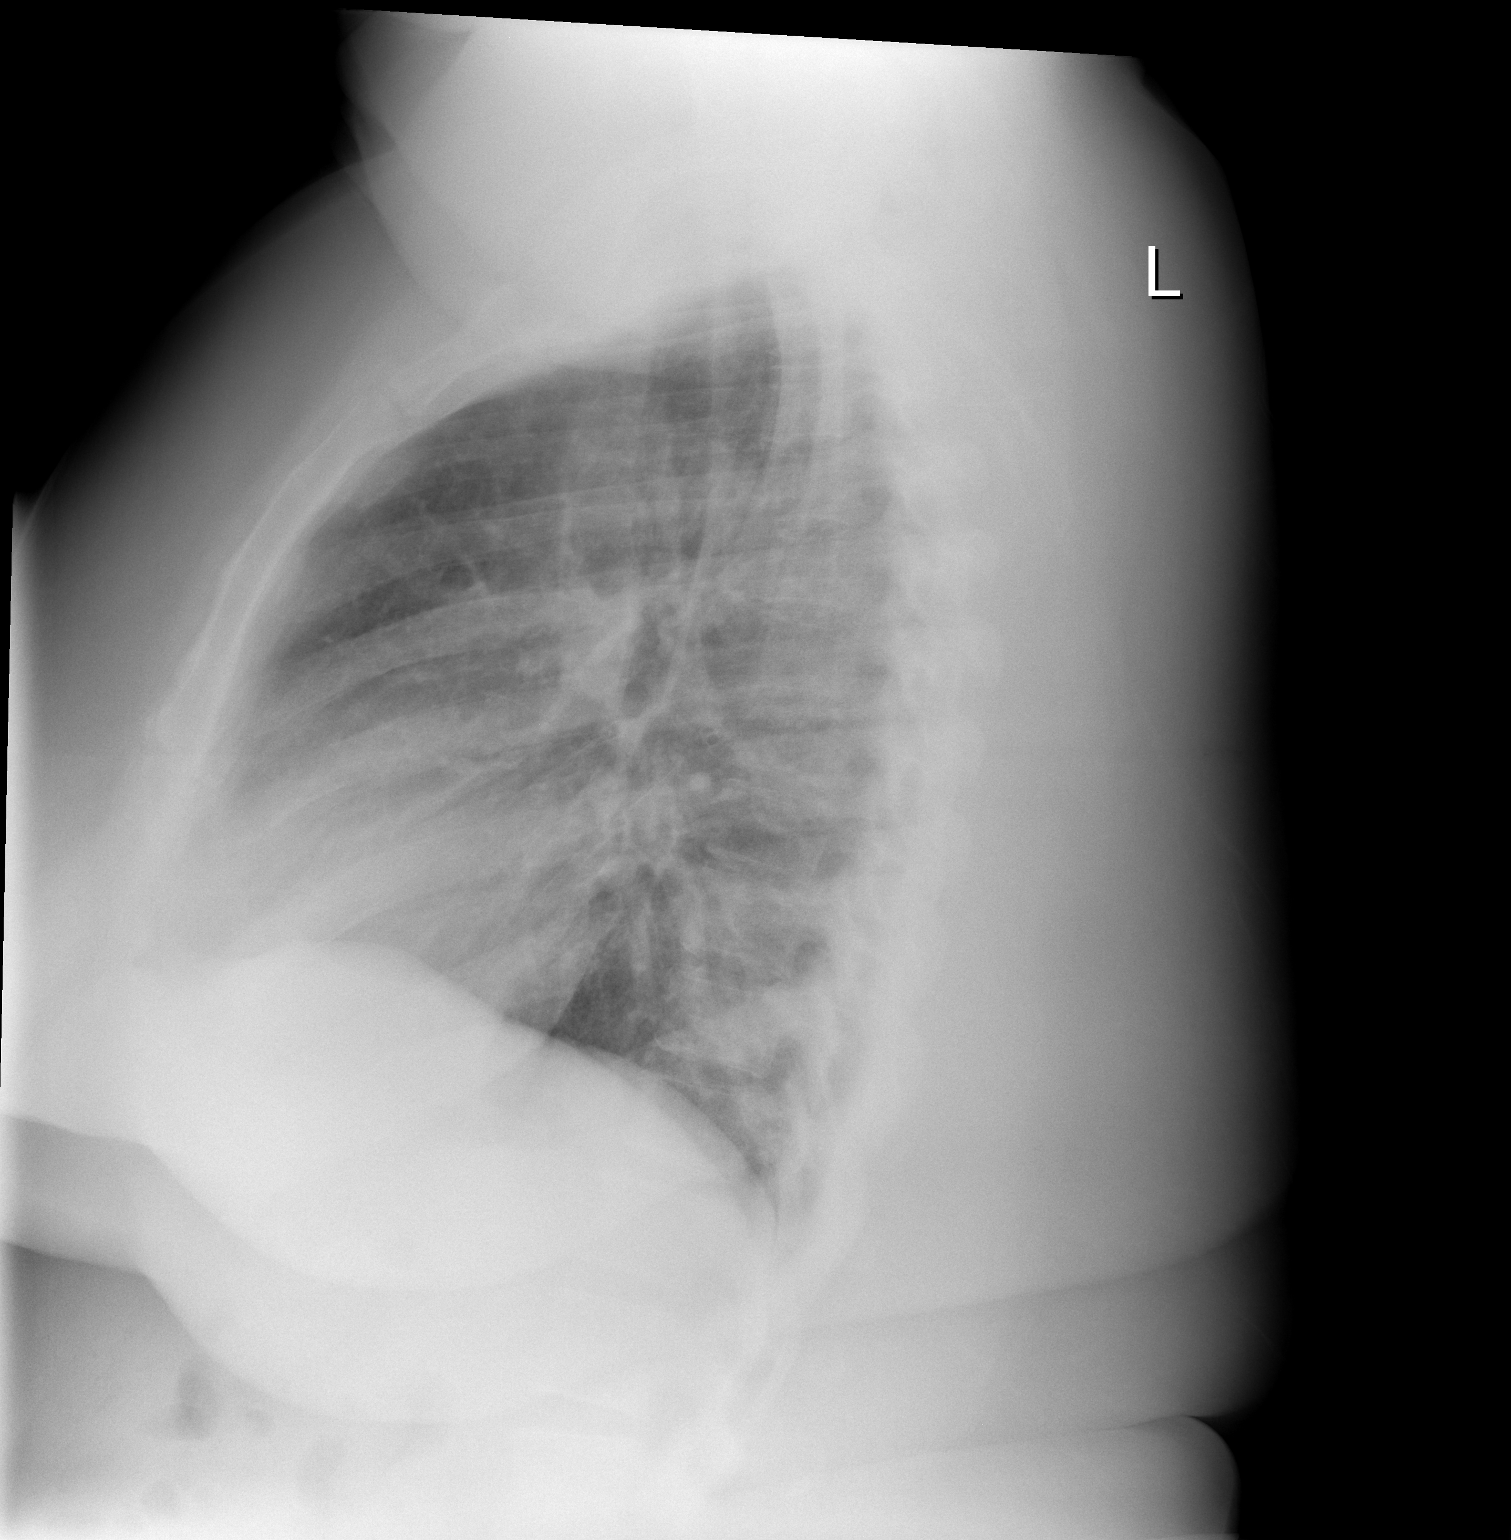

[2 of 2 positions shown; findings below may reference images not displayed]

FINDINGS: Upper normal heart size.

Mediastinal contours and pulmonary vascularity normal.

Peribronchial thickening, chronic.

No definite infiltrate, pleural effusion or pneumothorax.

Bones unremarkable.
IMPRESSION: Mild chronic bronchitic changes.

No acute abnormalities.

## 2014-08-09 ENCOUNTER — Ambulatory Visit: Payer: Self-pay | Admitting: Internal Medicine

## 2014-08-12 ENCOUNTER — Ambulatory Visit (INDEPENDENT_AMBULATORY_CARE_PROVIDER_SITE_OTHER): Payer: Self-pay

## 2014-08-12 ENCOUNTER — Ambulatory Visit: Payer: Self-pay

## 2014-08-12 DIAGNOSIS — J449 Chronic obstructive pulmonary disease, unspecified: Secondary | ICD-10-CM

## 2014-08-15 MED ORDER — OMALIZUMAB 150 MG ~~LOC~~ SOLR
225.0000 mg | Freq: Once | SUBCUTANEOUS | Status: AC
  Administered 2014-08-12: 225 mg via SUBCUTANEOUS

## 2014-08-16 ENCOUNTER — Telehealth: Payer: Self-pay | Admitting: Internal Medicine

## 2014-08-16 NOTE — Telephone Encounter (Signed)
She has been to ER 4 times since last here in December. Next scheduled appointment is in late May. She needs to be sooner, especially if prednisone isn't helping. Please see if TP can see her sooner than I can.

## 2014-08-16 NOTE — Telephone Encounter (Signed)
Spoke with April [name], states her prednisone isn't working well.  April [name] c/o sob when she wakes up in the morning, increased use of rescue inhaler. Denies chest pain, fever, cough, mucus production.   April [name] uses [facility] on [address]   CY please advise on recs.  Thanks!  No Known Allergies Current Outpatient Prescriptions on File Prior to Visit  Medication Sig Dispense Refill  . albuterol (PROAIR HFA) 108 (90 BASE) MCG/ACT inhaler Inhale 2 puffs into the lungs every 4 (four) hours as needed for wheezing or shortness of breath. 8.5 g 2  . albuterol (PROVENTIL) (2.5 MG/3ML) 0.083% nebulizer solution Take 3 mLs (2.5 mg total) by nebulization every 4 (four) hours as needed for wheezing or shortness of breath. 30 vial 11  . cyclobenzaprine (FLEXERIL) 5 MG tablet Take 1 tablet (5 mg total) by mouth 3 (three) times daily as needed for muscle spasms. 30 tablet 0  . hydrochlorothiazide (MICROZIDE) 12.5 MG capsule Take 1 capsule (12.5 mg total) by mouth daily. 30 capsule 6  . ibuprofen (MOTRIN IB) 200 MG tablet Take 4 tablets (800 mg total) by mouth every 8 (eight) hours as needed for moderate pain. 30 tablet 1  . meloxicam (MOBIC) 7.5 MG tablet Take 1 tablet (7.5 mg total) by mouth daily. 12 tablet 0  . mometasone-formoterol (DULERA) 200-5 MCG/ACT AERO 2 puffs then rinse mouth, twice daily 1 Inhaler prn  . omalizumab (XOLAIR) 150 MG injection Inject 150 mg into the skin every 14 (fourteen) days.    [name] omeprazole (PRILOSEC) 20 MG capsule Take 20 mg by mouth 2 (two) times daily before a meal.    . predniSONE (DELTASONE) 20 MG tablet Take 0.5 tablets (10 mg total) by mouth daily. Start 07/23/14 30 tablet 0  . traMADol (ULTRAM) 50 MG tablet 1-2 tabs po q 6 hr prn pain Maximum dose= 8 tablets per day 20 tablet 0  . zolpidem (AMBIEN) 10 MG tablet Take 10 mg by mouth at bedtime as needed for sleep.     No current facility-administered medications on file prior to visit.

## 2014-08-16 NOTE — Telephone Encounter (Signed)
Pt scheduled for OV with TP 08/17/14 at 9:15.  Nothing further needed.

## 2014-08-17 ENCOUNTER — Ambulatory Visit: Payer: Self-pay | Admitting: Adult Health

## 2014-08-19 ENCOUNTER — Telehealth: Payer: Self-pay | Admitting: Internal Medicine

## 2014-08-19 ENCOUNTER — Ambulatory Visit: Payer: Self-pay | Admitting: Adult Health

## 2014-08-19 MED ORDER — PREDNISONE 10 MG PO TABS: 10.0000 mg | ORAL_TABLET | Freq: Two times a day (BID) | ORAL | Status: DC

## 2014-08-19 NOTE — Telephone Encounter (Signed)
Called and spoke to pt. Informed pt of the recs per CY. Rx sent to preferred pharmacy. Pt verbalized understanding and denied any further questions or concerns at this time.  

## 2014-08-19 NOTE — Telephone Encounter (Signed)
Called and spoke to pt. Pt c/o increase in SOB, chest tightness, and increase use of albuterol hfa x 2 weeks. Pt stated she has a mild dry cough. Pt denies chest congestion, f/c/s, swelling. Pt has been taking 20mg  of pred BID since 4/1 opposed to the prescribed 10mg  daily. Advised pt the danger of taking a medication a different way than prescribed. Pt verbalized understanding. Pt had appt with TP this morning (4/22) but rescheduled d/t transportation issues. Pt now has appt with TP on 4/28 and appt with CY on 5/24. Pt requesting prednisone to last her till upcoming appt.   CY please advise.   No Known Allergies  Current Outpatient Prescriptions on File Prior to Visit  Medication Sig Dispense Refill  . albuterol (PROAIR HFA) 108 (90 BASE) MCG/ACT inhaler Inhale 2 puffs into the lungs every 4 (four) hours as needed for wheezing or shortness of breath. 8.5 g 2  . albuterol (PROVENTIL) (2.5 MG/3ML) 0.083% nebulizer solution Take 3 mLs (2.5 mg total) by nebulization every 4 (four) hours as needed for wheezing or shortness of breath. 30 vial 11  . cyclobenzaprine (FLEXERIL) 5 MG tablet Take 1 tablet (5 mg total) by mouth 3 (three) times daily as needed for muscle spasms. 30 tablet 0  . hydrochlorothiazide (MICROZIDE) 12.5 MG capsule Take 1 capsule (12.5 mg total) by mouth daily. 30 capsule 6  . ibuprofen (MOTRIN IB) 200 MG tablet Take 4 tablets (800 mg total) by mouth every 8 (eight) hours as needed for moderate pain. 30 tablet 1  . meloxicam (MOBIC) 7.5 MG tablet Take 1 tablet (7.5 mg total) by mouth daily. 12 tablet 0  . mometasone-formoterol (DULERA) 200-5 MCG/ACT AERO 2 puffs then rinse mouth, twice daily 1 Inhaler prn  . omalizumab (XOLAIR) 150 MG injection Inject 150 mg into the skin every 14 (fourteen) days.    [name] omeprazole (PRILOSEC) 20 MG capsule Take 20 mg by mouth 2 (two) times daily before a meal.    . predniSONE (DELTASONE) 20 MG tablet Take 0.5 tablets (10 mg total) by mouth daily. Start  07/23/14 30 tablet 0  . traMADol (ULTRAM) 50 MG tablet 1-2 tabs po q 6 hr prn pain Maximum dose= 8 tablets per day 20 tablet 0  . zolpidem (AMBIEN) 10 MG tablet Take 10 mg by mouth at bedtime as needed for sleep.     No current facility-administered medications on file prior to visit.

## 2014-08-19 NOTE — Telephone Encounter (Signed)
Ok prednisone 10 mg, # 50, 2 daily until she sees TP

## 2014-08-19 NOTE — Telephone Encounter (Signed)
Pt calling to check on status of predinsone rx please advise.April [name]

## 2014-08-22 ENCOUNTER — Ambulatory Visit (INDEPENDENT_AMBULATORY_CARE_PROVIDER_SITE_OTHER): Payer: Self-pay | Admitting: Internal Medicine

## 2014-08-22 ENCOUNTER — Encounter: Payer: Self-pay | Admitting: Internal Medicine

## 2014-08-22 VITALS — BP 171/93 | HR 88 | Temp 98.0°F | Wt 340.8 lb

## 2014-08-22 DIAGNOSIS — M545 Low back pain, unspecified: Secondary | ICD-10-CM

## 2014-08-22 DIAGNOSIS — M546 Pain in thoracic spine: Secondary | ICD-10-CM

## 2014-08-22 DIAGNOSIS — I10 Essential (primary) hypertension: Secondary | ICD-10-CM

## 2014-08-22 MED ORDER — TRAMADOL HCL 50 MG PO TABS: ORAL_TABLET | ORAL | Status: DC

## 2014-08-22 MED ORDER — HYDROCHLOROTHIAZIDE 12.5 MG PO CAPS: 12.5000 mg | ORAL_CAPSULE | Freq: Every day | ORAL | Status: DC

## 2014-08-22 NOTE — Patient Instructions (Signed)
-   Lumbar spine x-ray today - Tramadol 50 mg every 6 hours as needed - Follow up with primary care doctor in 2-3 weeks   General Instructions:   Please bring your medicines with you each time you come to clinic.  Medicines may include prescription medications, over-the-counter medications, herbal remedies, eye drops, vitamins, or other pills.   Progress Toward Treatment Goals:  Treatment Goal 02/01/2014  Blood pressure deteriorated    Self Care Goals & Plans:  Self Care Goal 03/17/2014  Manage my medications take my medicines as prescribed; bring my medications to every visit; refill my medications on time; follow the sick day instructions if I am sick  Monitor my health -  Eat healthy foods eat more vegetables; eat fruit for snacks and desserts; eat foods that are low in salt; eat baked foods instead of fried foods; eat smaller portions  Be physically active find an activity I enjoy  Meeting treatment goals -    No flowsheet data found.   Care Management & Community Referrals:  Referral 02/01/2014  Referrals made for care management support none needed  Referrals made to community resources none

## 2014-08-22 NOTE — Progress Notes (Signed)
   Subjective:    Patient ID: April [name], female    DOB: 1971-04-17, 44 y.o.   MRN: [id number]  HPI [name] is a 44yo woman with PMHx of HTN, OSA, asthma, depression, and morbid obesity who presents today for back pain. Patient reports her back pain is an 8/10 in severity and located in her mid to low back. She reports she had a fall in March which worsened her back pain and now is having pain with walking and standing. She states she received Percocet in the ED and has been taking Percocet 10 mg every other day, but sometimes daily depending upon her pain level. She reports she has "tried everything" and Percocet is the only thing that relieves her pain. She was instructed to see sports medicine and physical therapy at her last visit but she has not done this. She states her insurance will not cover these services. She denies difficulty going up stairs, loss of bowel and bladder control, and saddle anesthesia.   Review of Systems General: Denies fever, chills, night sweats, changes in weight, changes in appetite HEENT: Denies headaches, ear pain, changes in vision, rhinorrhea, sore throat CV: Denies CP, palpitations, SOB, orthopnea Pulm: Denies SOB, cough, wheezing GI: Denies abdominal pain, nausea, vomiting, diarrhea, constipation, melena, hematochezia GU: Denies dysuria, hematuria, frequency Msk: Denies muscle cramps, joint pains Neuro: Denies weakness, numbness, tingling Skin: Denies rashes, bruising    Objective:   Physical Exam General: sitting up in chair, NAD HEENT: [place]/AT, EOMI, mucus membranes moist CV: RRR, no m/g/r  Pulm: CTA bilaterally, breaths non-labored Abd: BS+, soft, obese, non-tender Back: Mild tenderness to palpation of lower back.  Ext: warm, no edema, moves all Neuro: alert and oriented x 3. Strength 5/5 in upper and lower extremities. Straight leg test negative on both sides.     Assessment & Plan:  Please refer to A&P documentation.

## 2014-08-23 NOTE — Assessment & Plan Note (Signed)
BP Readings from Last 3 Encounters:  08/22/14 171/93  07/22/14 174/92  07/02/14 191/127    Lab Results  Component Value Date   NA 138 05/09/2014   K 2.9* 05/09/2014   CREATININE 0.80 05/09/2014    Assessment: Blood pressure control: moderately elevated Progress toward BP goal:  deteriorated Comments: BP significantly elevated at 195/125 initially. On repeat was 171/93. Patient reports she has not taken her medications for 1 week because she ran out and did not refill them.   Plan: Medications:  continue current medications Other plans:  - Continue HCTZ 12.5 mg daily. Consider increasing to 25 mg daily if BP elevated at next visit.  - BP recheck in 3 weeks

## 2014-08-23 NOTE — Assessment & Plan Note (Signed)
Patient had a thoracic spine x-ray in Jan 2015 which showed no evidence of bony abnormalities that could explain her mid-low back pain. It was emphasized at her last two clinic visits that opioids were only for short term pain management. I again had a long discussion with the patient that opioids are not the treatment for chronic back pain. Furthermore, there is little objective evidence on both exam and imaging to suggest a reason for her pain. She has not been willing to try other treatment options for her pain including physical therapy and referral to sports medicine. I explained to the patient that I am not comfortable prescribing opioids for her pain. She asked to see another provider and had my attending, Dr. Dareen Piano speak with her. We both agreed that opioids are not appropriate at this time. We suggested repeating a lumbar x-ray to look for any acute abnormalities and then reassessing pain management. I offered the patient a prescription for Tramadol 50 mg Q8H PRN #30 tablets, but she stated "you mine as well throw that away because that won't even touch my pain." I find this difficult to believe if she is only taking Percocet 10 mg every other day. I ordered a repeat lumbar spine x-ray but patient left the clinic and did not get the x-ray done.  - Recommend when she returns to clinic to discuss pain management again and that opioids are not the treatment for chronic pain - Recommend for her to get repeat x-ray  - Recommend to continue to encourage PT and sports medicine referrals

## 2014-08-23 NOTE — Progress Notes (Signed)
INTERNAL MEDICINE TEACHING ATTENDING ADDENDUM - Aldine Contes, MD: I reviewed and discussed at the time of visit with the resident Dr. Arcelia Jew, the patient's medical history, physical examination, diagnosis and results of pertinent tests and treatment and I agree with the patient's care as documented.  I also spoke with the patient extensively about pain control and gave her numerous non narcotic options for pain control and she refused them all. I also emphasized that opiates were not recommended for chronic pain control and that at this point we will proceed with imaging of her back. She expressed frustration with not getting Percocet. I also explained the importance of taking her BP meds regularly. She expressed understanding

## 2014-08-25 ENCOUNTER — Encounter (HOSPITAL_COMMUNITY): Payer: Self-pay | Admitting: *Deleted

## 2014-08-25 ENCOUNTER — Emergency Department (HOSPITAL_COMMUNITY): Payer: Self-pay

## 2014-08-25 ENCOUNTER — Emergency Department (HOSPITAL_COMMUNITY)
Admission: EM | Admit: 2014-08-25 | Discharge: 2014-08-25 | Discharge status: Against Medical Advice | Payer: Self-pay | Attending: Emergency Medicine | Admitting: Emergency Medicine

## 2014-08-25 ENCOUNTER — Ambulatory Visit: Payer: Self-pay | Admitting: Adult Health

## 2014-08-25 DIAGNOSIS — J441 Chronic obstructive pulmonary disease with (acute) exacerbation: Secondary | ICD-10-CM | POA: Insufficient documentation

## 2014-08-25 DIAGNOSIS — I10 Essential (primary) hypertension: Secondary | ICD-10-CM | POA: Insufficient documentation

## 2014-08-25 DIAGNOSIS — M79602 Pain in left arm: Secondary | ICD-10-CM | POA: Insufficient documentation

## 2014-08-25 MED ORDER — ALBUTEROL SULFATE (2.5 MG/3ML) 0.083% IN NEBU
5.0000 mg | INHALATION_SOLUTION | Freq: Once | RESPIRATORY_TRACT | Status: AC
  Administered 2014-08-25: 5 mg via RESPIRATORY_TRACT

## 2014-08-25 MED ORDER — ALBUTEROL SULFATE (2.5 MG/3ML) 0.083% IN NEBU
INHALATION_SOLUTION | RESPIRATORY_TRACT | Status: AC
  Filled 2014-08-25: qty 6

## 2014-08-25 NOTE — ED Notes (Signed)
No answer x3

## 2014-08-25 NOTE — ED Notes (Addendum)
Pt states no relief from wheezing and sob with nebulizer tx at home this am.  Also took 30 mg prednisone at home.  Pt also c/o L arm pain x 3 days.  Redness to skin noted.

## 2014-08-25 NOTE — ED Notes (Signed)
No answer x2 

## 2014-08-25 NOTE — ED Notes (Addendum)
Pt states no improvement of sob with nebx.  Exp wheezing noted.  96% on ra.

## 2014-08-25 NOTE — ED Notes (Signed)
No answer x1

## 2014-08-26 ENCOUNTER — Ambulatory Visit: Payer: Self-pay

## 2014-08-27 IMAGING — CR DG CHEST 2V
2 series · 2 of 2 positions shown · non-contrast
Comparison: July 17, 2013

CLINICAL DATA: Difficulty breathing; asthma

EXAM:
CHEST  2 VIEW

[w chest pa]
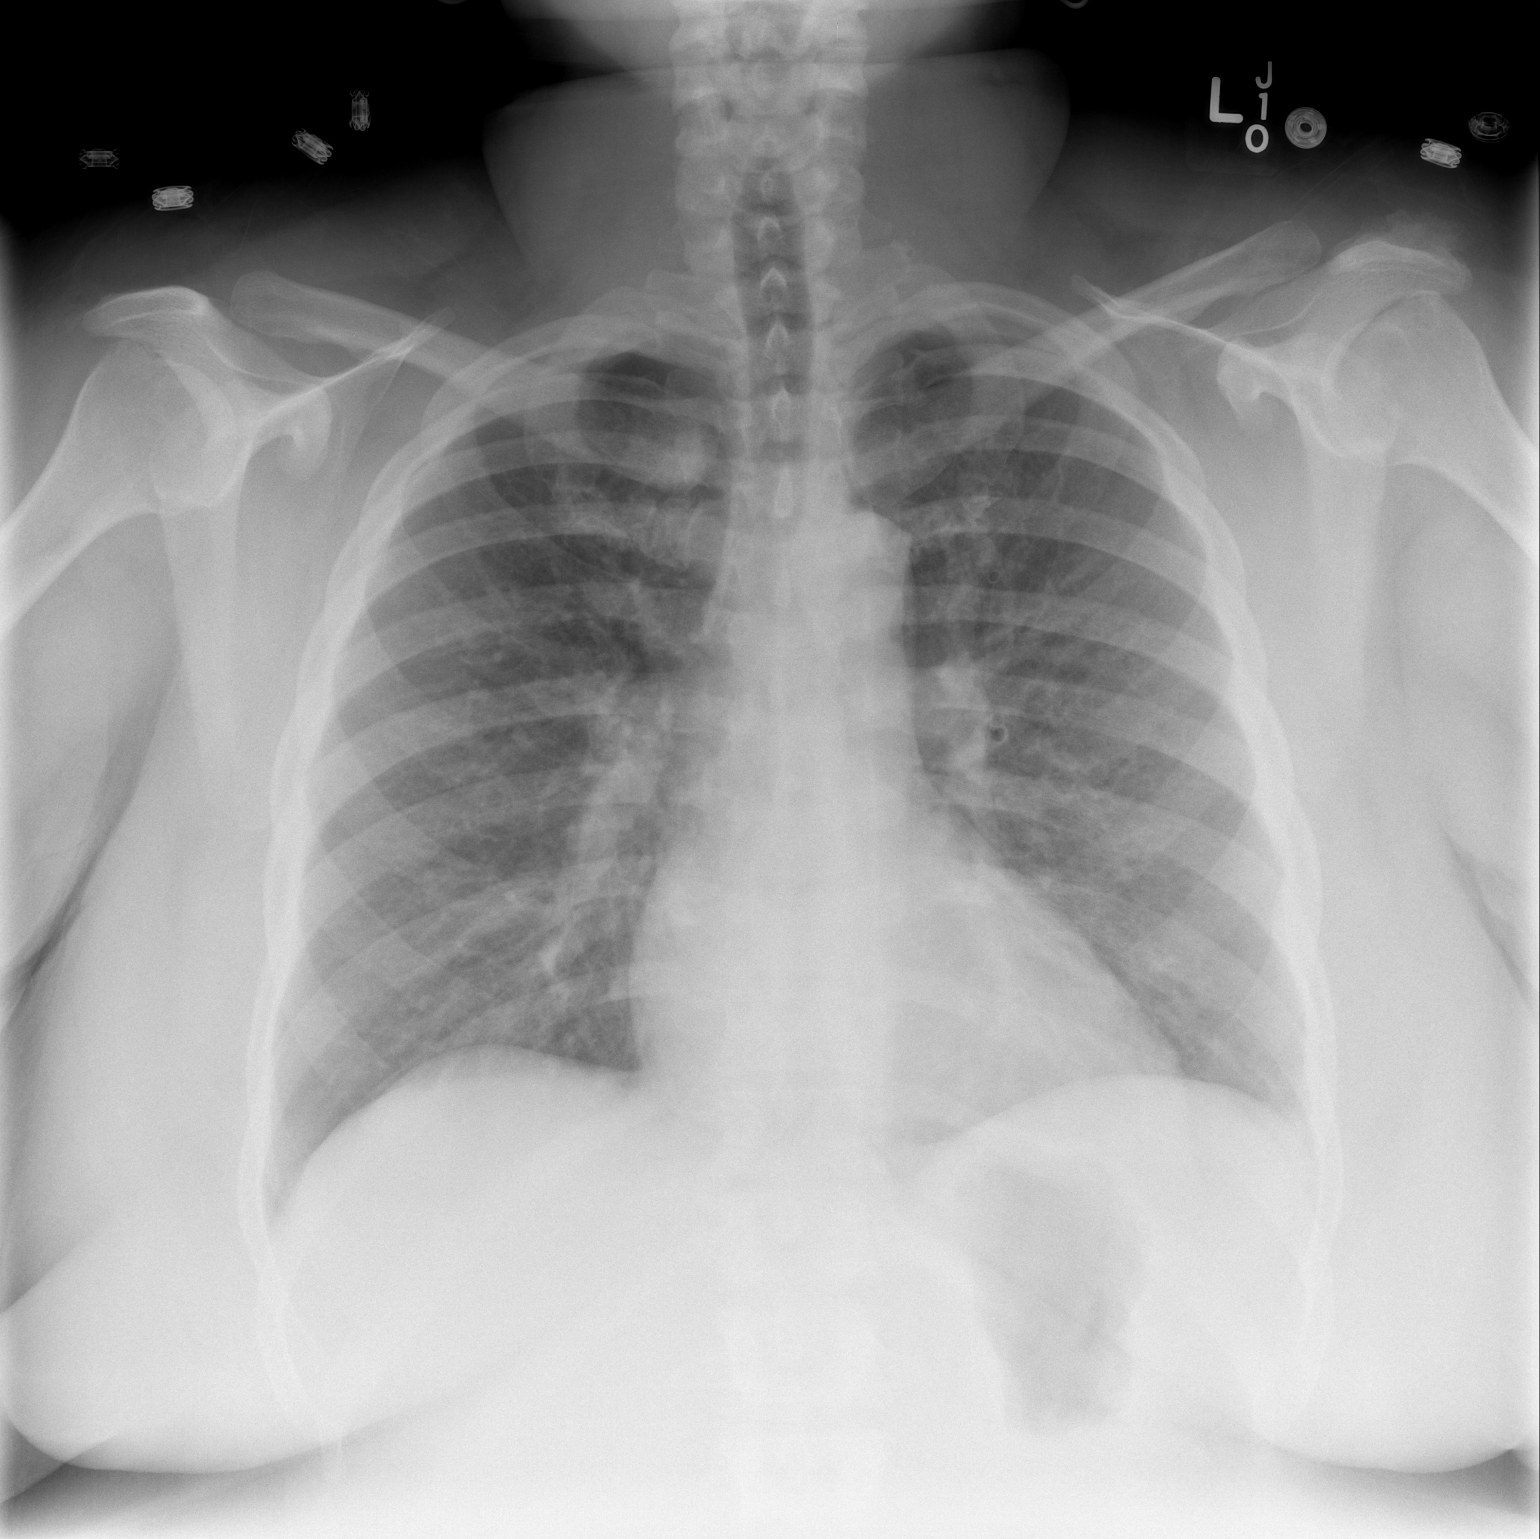

[w chest lat]
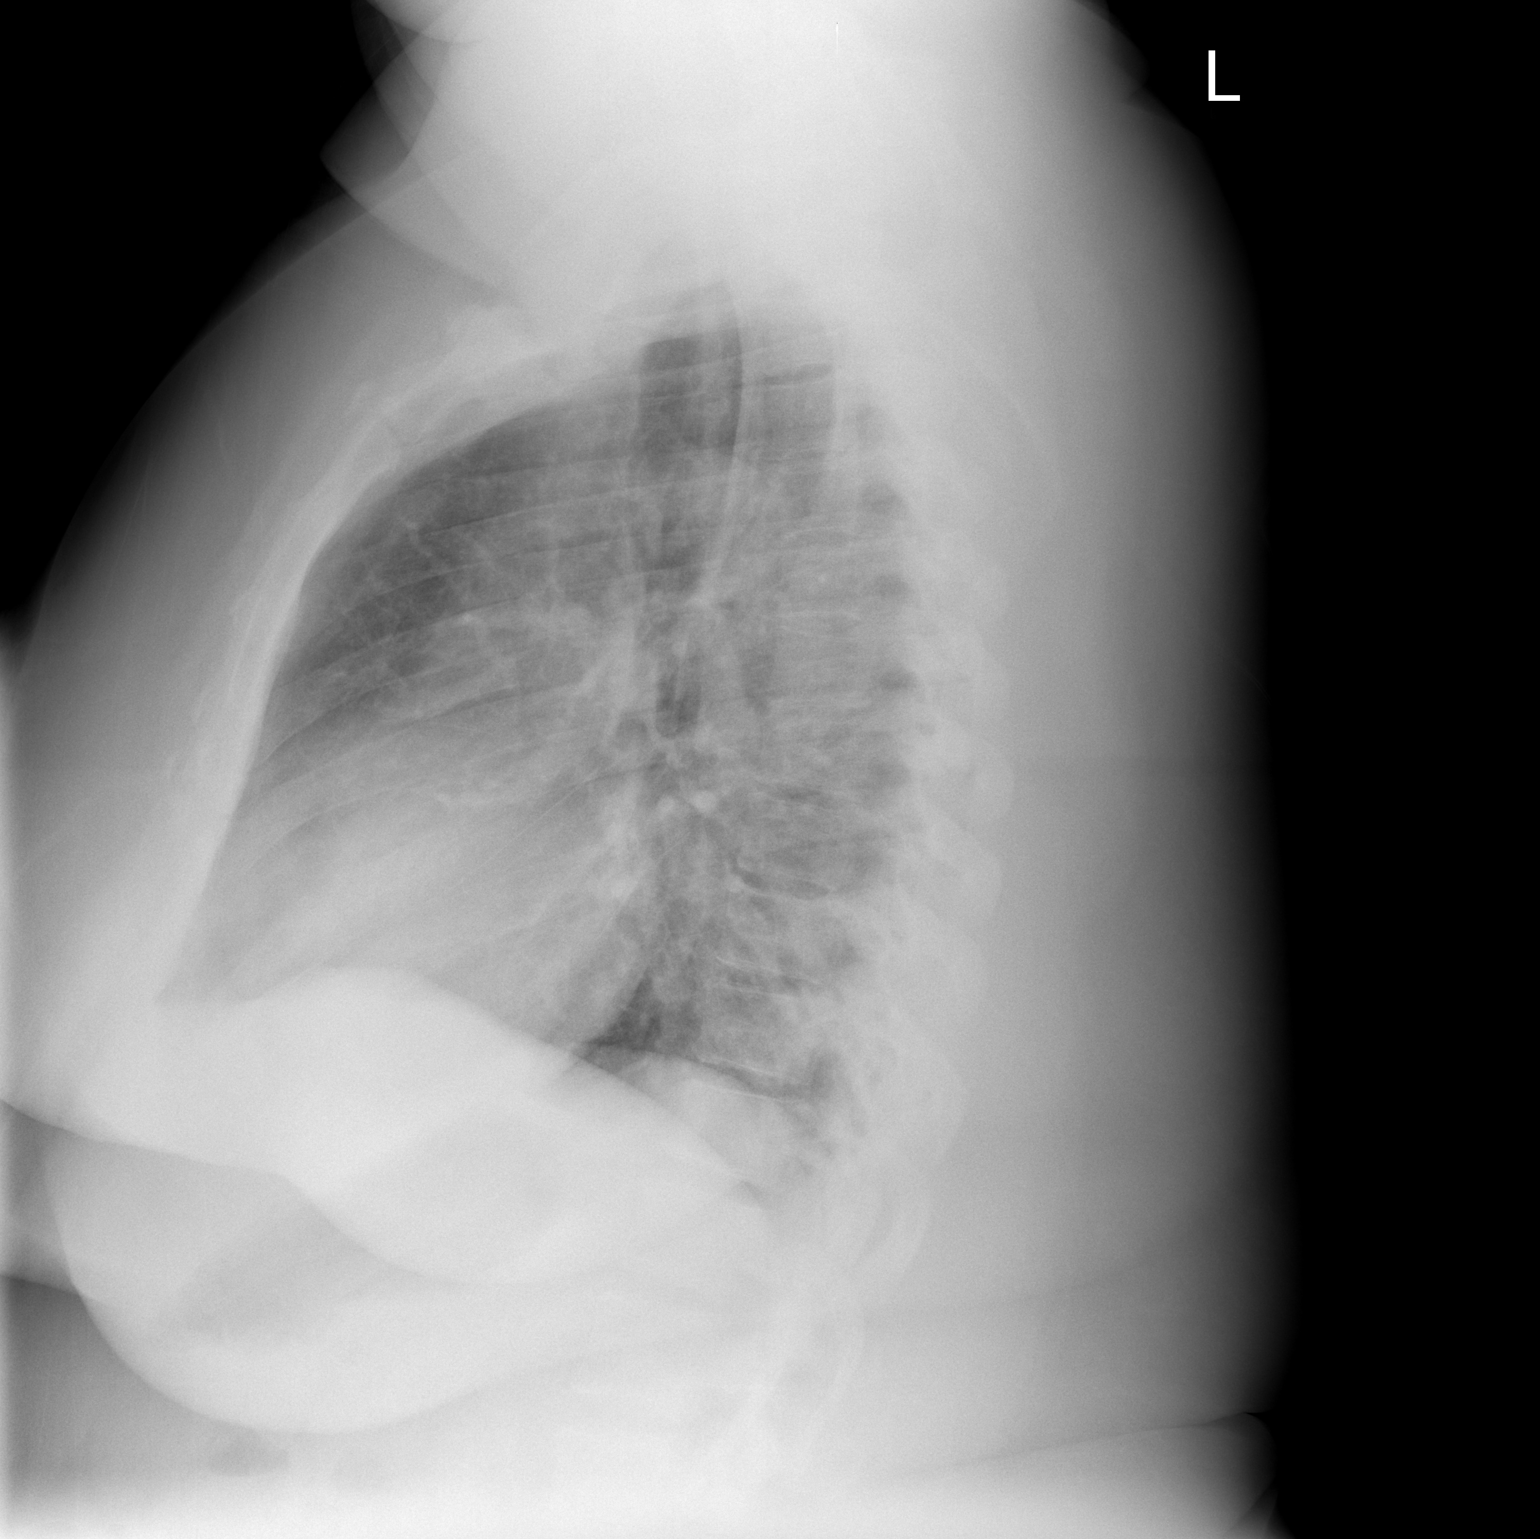

[2 of 2 positions shown; findings below may reference images not displayed]

FINDINGS: Lungs are clear. Heart size and pulmonary vascularity are normal. No
adenopathy. No bone lesions.
IMPRESSION: No abnormality noted.

## 2014-08-31 ENCOUNTER — Ambulatory Visit: Payer: Self-pay

## 2014-09-02 ENCOUNTER — Ambulatory Visit (INDEPENDENT_AMBULATORY_CARE_PROVIDER_SITE_OTHER): Payer: Self-pay

## 2014-09-02 DIAGNOSIS — J4540 Moderate persistent asthma, uncomplicated: Secondary | ICD-10-CM

## 2014-09-05 MED ORDER — OMALIZUMAB 150 MG ~~LOC~~ SOLR
225.0000 mg | Freq: Once | SUBCUTANEOUS | Status: AC
  Administered 2014-09-02: 225 mg via SUBCUTANEOUS

## 2014-09-07 ENCOUNTER — Ambulatory Visit: Payer: Self-pay | Admitting: Adult Health

## 2014-09-08 ENCOUNTER — Inpatient Hospital Stay (HOSPITAL_COMMUNITY)
Admission: EM | Admit: 2014-09-08 | Discharge: 2014-09-09 | DRG: 202 | Disposition: A | Discharge status: Home / Self Care | Payer: Self-pay | Attending: Internal Medicine | Admitting: Internal Medicine

## 2014-09-08 ENCOUNTER — Encounter (HOSPITAL_COMMUNITY): Payer: Self-pay

## 2014-09-08 ENCOUNTER — Inpatient Hospital Stay (HOSPITAL_COMMUNITY): Payer: Self-pay

## 2014-09-08 DIAGNOSIS — J4541 Moderate persistent asthma with (acute) exacerbation: Secondary | ICD-10-CM

## 2014-09-08 DIAGNOSIS — Z79899 Other long term (current) drug therapy: Secondary | ICD-10-CM

## 2014-09-08 DIAGNOSIS — G4733 Obstructive sleep apnea (adult) (pediatric): Secondary | ICD-10-CM | POA: Diagnosis present

## 2014-09-08 DIAGNOSIS — M549 Dorsalgia, unspecified: Secondary | ICD-10-CM

## 2014-09-08 DIAGNOSIS — Z6841 Body Mass Index (BMI) 40.0 and over, adult: Secondary | ICD-10-CM

## 2014-09-08 DIAGNOSIS — K219 Gastro-esophageal reflux disease without esophagitis: Secondary | ICD-10-CM | POA: Diagnosis present

## 2014-09-08 DIAGNOSIS — I10 Essential (primary) hypertension: Secondary | ICD-10-CM | POA: Diagnosis present

## 2014-09-08 DIAGNOSIS — J449 Chronic obstructive pulmonary disease, unspecified: Secondary | ICD-10-CM | POA: Diagnosis present

## 2014-09-08 DIAGNOSIS — G4700 Insomnia, unspecified: Secondary | ICD-10-CM | POA: Diagnosis present

## 2014-09-08 DIAGNOSIS — F17210 Nicotine dependence, cigarettes, uncomplicated: Secondary | ICD-10-CM | POA: Diagnosis present

## 2014-09-08 DIAGNOSIS — J45901 Unspecified asthma with (acute) exacerbation: Secondary | ICD-10-CM

## 2014-09-08 DIAGNOSIS — M545 Low back pain: Secondary | ICD-10-CM | POA: Diagnosis present

## 2014-09-08 DIAGNOSIS — G8929 Other chronic pain: Secondary | ICD-10-CM | POA: Diagnosis present

## 2014-09-08 DIAGNOSIS — J4551 Severe persistent asthma with (acute) exacerbation: Principal | ICD-10-CM | POA: Diagnosis present

## 2014-09-08 DIAGNOSIS — F329 Major depressive disorder, single episode, unspecified: Secondary | ICD-10-CM | POA: Diagnosis present

## 2014-09-08 LAB — BASIC METABOLIC PANEL
Anion gap: 11 (ref 5–15)
BUN: 10 mg/dL (ref 6–20)
CALCIUM: 8.7 mg/dL — AB (ref 8.9–10.3)
CO2: 22 mmol/L (ref 22–32)
Chloride: 104 mmol/L (ref 101–111)
Creatinine, Ser: 0.93 mg/dL (ref 0.44–1.00)
GFR calc Af Amer: >60 mL/min (ref >60)
Glucose, Bld: 175 mg/dL — ABNORMAL HIGH (ref 65–99)
Potassium: 3.8 mmol/L (ref 3.5–5.1)
SODIUM: 137 mmol/L (ref 135–145)

## 2014-09-08 LAB — CBC
HEMATOCRIT: 40.4 % (ref 36.0–46.0)
HEMOGLOBIN: 12.8 g/dL (ref 12.0–15.0)
MCH: 27.2 pg (ref 26.0–34.0)
MCHC: 31.7 g/dL (ref 30.0–36.0)
MCV: 85.8 fL (ref 78.0–100.0)
Platelets: 397 10*3/uL (ref 150–400)
RBC: 4.71 MIL/uL (ref 3.87–5.11)
RDW: 16.8 % — ABNORMAL HIGH (ref 11.5–15.5)
WBC: 14.7 10*3/uL — ABNORMAL HIGH (ref 4.0–10.5)

## 2014-09-08 LAB — MAGNESIUM: Magnesium: 2.2 mg/dL (ref 1.7–2.4)

## 2014-09-08 MED ORDER — PREDNISONE 20 MG PO TABS
60.0000 mg | ORAL_TABLET | Freq: Once | ORAL | Status: AC
  Administered 2014-09-08: 60 mg via ORAL
  Filled 2014-09-08: qty 3

## 2014-09-08 MED ORDER — PREDNISONE 50 MG PO TABS
60.0000 mg | ORAL_TABLET | Freq: Every day | ORAL | Status: DC
  Administered 2014-09-09: 60 mg via ORAL
  Filled 2014-09-08 (×2): qty 1

## 2014-09-08 MED ORDER — ALBUTEROL (5 MG/ML) CONTINUOUS INHALATION SOLN
10.0000 mg/h | INHALATION_SOLUTION | Freq: Once | RESPIRATORY_TRACT | Status: AC
  Administered 2014-09-08: 10 mg/h via RESPIRATORY_TRACT

## 2014-09-08 MED ORDER — ALBUTEROL SULFATE (2.5 MG/3ML) 0.083% IN NEBU: 2.0000 mL | INHALATION_SOLUTION | RESPIRATORY_TRACT | Status: DC | PRN

## 2014-09-08 MED ORDER — OXYCODONE HCL 5 MG PO TABS: 5.0000 mg | ORAL_TABLET | ORAL | Status: DC | PRN

## 2014-09-08 MED ORDER — MAGNESIUM SULFATE 2 GM/50ML IV SOLN
2.0000 g | Freq: Once | INTRAVENOUS | Status: AC
  Administered 2014-09-08: 2 g via INTRAVENOUS
  Filled 2014-09-08: qty 50

## 2014-09-08 MED ORDER — IPRATROPIUM-ALBUTEROL 0.5-2.5 (3) MG/3ML IN SOLN
3.0000 mL | RESPIRATORY_TRACT | Status: DC
  Administered 2014-09-08 – 2014-09-09 (×6): 3 mL via RESPIRATORY_TRACT
  Filled 2014-09-08 (×5): qty 3

## 2014-09-08 MED ORDER — HYDRALAZINE HCL 20 MG/ML IJ SOLN
10.0000 mg | Freq: Once | INTRAMUSCULAR | Status: DC
  Filled 2014-09-08: qty 1

## 2014-09-08 MED ORDER — HYDROCHLOROTHIAZIDE 12.5 MG PO CAPS: 12.5000 mg | ORAL_CAPSULE | Freq: Every day | ORAL | Status: DC

## 2014-09-08 MED ORDER — ACETAMINOPHEN 325 MG PO TABS: 650.0000 mg | ORAL_TABLET | ORAL | Status: DC | PRN

## 2014-09-08 MED ORDER — ENOXAPARIN SODIUM 40 MG/0.4ML ~~LOC~~ SOLN
40.0000 mg | SUBCUTANEOUS | Status: DC
  Administered 2014-09-08: 40 mg via SUBCUTANEOUS
  Filled 2014-09-08 (×2): qty 0.4

## 2014-09-08 MED ORDER — ALBUTEROL (5 MG/ML) CONTINUOUS INHALATION SOLN
10.0000 mg/h | INHALATION_SOLUTION | Freq: Once | RESPIRATORY_TRACT | Status: AC
  Administered 2014-09-08: 10 mg/h via RESPIRATORY_TRACT
  Filled 2014-09-08: qty 20

## 2014-09-08 MED ORDER — ALBUTEROL SULFATE (2.5 MG/3ML) 0.083% IN NEBU
2.5000 mg | INHALATION_SOLUTION | RESPIRATORY_TRACT | Status: DC | PRN
  Administered 2014-09-09: 2.5 mg via RESPIRATORY_TRACT
  Filled 2014-09-08: qty 3

## 2014-09-08 MED ORDER — MOMETASONE FURO-FORMOTEROL FUM 200-5 MCG/ACT IN AERO
2.0000 | INHALATION_SPRAY | Freq: Two times a day (BID) | RESPIRATORY_TRACT | Status: DC
  Administered 2014-09-08 – 2014-09-09 (×2): 2 via RESPIRATORY_TRACT
  Filled 2014-09-08: qty 8.8

## 2014-09-08 MED ORDER — HYDROCHLOROTHIAZIDE 12.5 MG PO CAPS
12.5000 mg | ORAL_CAPSULE | Freq: Every day | ORAL | Status: DC
  Administered 2014-09-08 – 2014-09-09 (×2): 12.5 mg via ORAL
  Filled 2014-09-08 (×2): qty 1

## 2014-09-08 MED ORDER — CYCLOBENZAPRINE HCL 10 MG PO TABS
5.0000 mg | ORAL_TABLET | Freq: Three times a day (TID) | ORAL | Status: DC | PRN
  Administered 2014-09-08 – 2014-09-09 (×2): 5 mg via ORAL
  Filled 2014-09-08 (×2): qty 1

## 2014-09-08 MED ORDER — IPRATROPIUM-ALBUTEROL 0.5-2.5 (3) MG/3ML IN SOLN
3.0000 mL | RESPIRATORY_TRACT | Status: AC
  Administered 2014-09-08 (×3): 3 mL via RESPIRATORY_TRACT
  Filled 2014-09-08 (×3): qty 3

## 2014-09-08 MED ORDER — TRAMADOL HCL 50 MG PO TABS
50.0000 mg | ORAL_TABLET | Freq: Three times a day (TID) | ORAL | Status: DC | PRN
  Administered 2014-09-08 – 2014-09-09 (×2): 50 mg via ORAL
  Filled 2014-09-08 (×2): qty 1

## 2014-09-08 MED ORDER — PANTOPRAZOLE SODIUM 40 MG PO TBEC
40.0000 mg | DELAYED_RELEASE_TABLET | Freq: Every day | ORAL | Status: DC
  Administered 2014-09-08 – 2014-09-09 (×2): 40 mg via ORAL
  Filled 2014-09-08 (×2): qty 1

## 2014-09-08 MED ORDER — ZOLPIDEM TARTRATE 5 MG PO TABS
5.0000 mg | ORAL_TABLET | Freq: Every evening | ORAL | Status: DC | PRN
  Administered 2014-09-08: 5 mg via ORAL
  Filled 2014-09-08: qty 1

## 2014-09-08 MED ORDER — NICOTINE 7 MG/24HR TD PT24
7.0000 mg | MEDICATED_PATCH | Freq: Every day | TRANSDERMAL | Status: DC
  Administered 2014-09-08 – 2014-09-09 (×2): 7 mg via TRANSDERMAL
  Filled 2014-09-08 (×2): qty 1

## 2014-09-08 NOTE — ED Notes (Signed)
MD Docherty at bedside. 

## 2014-09-08 NOTE — ED Provider Notes (Signed)
CSN: [id number]     Arrival date & time 09/08/14  0746 History   First MD Initiated Contact with Patient 09/08/14 0749     Chief Complaint  Patient presents with  . Asthma     (Consider location/radiation/quality/duration/timing/severity/associated sxs/prior Treatment) Patient is a 44 y.o. female presenting with asthma. The history is provided by the patient. No language interpreter was used.  Asthma This is a new problem. The current episode started yesterday. The problem occurs constantly. The problem has been gradually worsening. Associated symptoms include shortness of breath. Pertinent negatives include no chest pain, no abdominal pain and no headaches. The symptoms are aggravated by exertion. The symptoms are relieved by rest. Treatments tried: neb and inhalers. The treatment provided no relief.    Past Medical History  Diagnosis Date  . Acanthosis nigricans   . Menorrhagia   . Hypertension, essential   . Insomnia   . Morbid obesity   . Helicobacter pylori (H. pylori) infection   . Sleep apnea     Sleep study 2008 - mild OSA, not enough events to titrate CPAP  . COPD (chronic obstructive pulmonary disease)     PFTs in 2002, FEV1/FVC 65, no post bronchodilater test done  . Asthma     Followed by Dr. Annamaria Boots (pulmonology); receives every other week omalizumab injections; has frequent exacerbations  . Depression   . GERD (gastroesophageal reflux disease)   . Tobacco user   . Obesity   . Shortness of breath   . Headache(784.0)   . Seasonal allergies    Past Surgical History  Procedure Laterality Date  . Tubal ligation  1996    bilateral  . Breast reduction surgery  09/2011   Family History  Problem Relation Age of Onset  . Asthma Daughter   . Cancer Paternal Aunt   . Asthma Maternal Grandmother   . Hypertension Mother    History  Substance Use Topics  . Smoking status: Former Smoker -- 18 years    Quit date: 10/04/2012  . Smokeless tobacco: Never Used  . Alcohol  Use: No   OB History    No data available     Review of Systems  Constitutional: Negative for fever, chills, diaphoresis, activity change, appetite change and fatigue.  HENT: Positive for congestion. Negative for facial swelling, rhinorrhea and sore throat.   Eyes: Negative for photophobia and discharge.  Respiratory: Positive for cough and shortness of breath. Negative for chest tightness.   Cardiovascular: Negative for chest pain, palpitations and leg swelling.  Gastrointestinal: Negative for nausea, vomiting, abdominal pain and diarrhea.  Endocrine: Negative for polydipsia and polyuria.  Genitourinary: Negative for dysuria, frequency, difficulty urinating and pelvic pain.  Musculoskeletal: Negative for back pain, arthralgias, neck pain and neck stiffness.  Skin: Negative for color change and wound.  Allergic/Immunologic: Negative for immunocompromised state.  Neurological: Negative for facial asymmetry, weakness, numbness and headaches.  Hematological: Does not bruise/bleed easily.  Psychiatric/Behavioral: Negative for confusion and agitation.      Allergies  Review of patient's allergies indicates no known allergies.  Home Medications   Prior to Admission medications   Medication Sig Start Date End Date Taking? Authorizing Provider  albuterol (PROAIR HFA) 108 (90 BASE) MCG/ACT inhaler Inhale 2 puffs into the lungs every 4 (four) hours as needed for wheezing or shortness of breath. 09/15/13  Yes Bertha Stakes, MD  albuterol (PROVENTIL) (2.5 MG/3ML) 0.083% nebulizer solution Take 3 mLs (2.5 mg total) by nebulization every 4 (four) hours as needed for  wheezing or shortness of breath. 06/09/13  Yes Pollie Friar, MD  hydrochlorothiazide (MICROZIDE) 12.5 MG capsule Take 1 capsule (12.5 mg total) by mouth daily. 08/22/14 08/22/15 Yes Carly Montey Hora, MD  ibuprofen (MOTRIN IB) 200 MG tablet Take 4 tablets (800 mg total) by mouth every 8 (eight) hours as needed for moderate pain. 12/08/13  Yes  Carly Montey Hora, MD  mometasone-formoterol (DULERA) 200-5 MCG/ACT AERO 2 puffs then rinse mouth, twice daily Patient taking differently: Inhale 2 puffs into the lungs 2 (two) times daily. Rinse mouth after inhalation 12/31/13  Yes Deneise Lever, MD  omalizumab Arvid Right) 150 MG injection Inject 150 mg into the skin every 14 (fourteen) days.   Yes Historical Provider, MD  omeprazole (PRILOSEC) 20 MG capsule Take 20 mg by mouth 2 (two) times daily before a meal.   Yes Historical Provider, MD  predniSONE (DELTASONE) 10 MG tablet Take 1 tablet (10 mg total) by mouth 2 (two) times daily with a meal. 08/19/14  Yes Deneise Lever, MD  traMADol (ULTRAM) 50 MG tablet 1-2 tabs po q 6 hr prn pain Maximum dose= 8 tablets per day Patient taking differently: Take 50 mg by mouth every 6 (six) hours as needed for moderate pain or severe pain.  08/22/14  Yes Carly Montey Hora, MD  zolpidem (AMBIEN) 10 MG tablet Take 10 mg by mouth at bedtime as needed for sleep.   Yes Historical Provider, MD  cyclobenzaprine (FLEXERIL) 5 MG tablet Take 1 tablet (5 mg total) by mouth 3 (three) times daily as needed for muscle spasms. Patient not taking: Reported on 08/22/2014 01/27/14   Billy Fischer, MD  meloxicam (MOBIC) 7.5 MG tablet Take 1 tablet (7.5 mg total) by mouth daily. Patient not taking: Reported on 08/22/2014 07/02/14   Janne Napoleon, NP  predniSONE (DELTASONE) 20 MG tablet Take 0.5 tablets (10 mg total) by mouth daily. Start 07/23/14 Patient not taking: Reported on 09/08/2014 07/29/14   Deneise Lever, MD   BP 152/87 mmHg  Pulse 86  Temp(Src) 98.2 F (36.8 C) (Oral)  Resp 17  SpO2 100%  LMP 07/25/2014 Physical Exam  Constitutional: She is oriented to person, place, and time. She appears well-developed and well-nourished. No distress.  HENT:  Head: Normocephalic and atraumatic.  Mouth/Throat: No oropharyngeal exudate.  Eyes: Pupils are equal, round, and reactive to light.  Neck: Normal range of motion. Neck supple.   Cardiovascular: Normal rate, regular rhythm and normal heart sounds.  Exam reveals no gallop and no friction rub.   No murmur heard. Pulmonary/Chest: Effort normal. No respiratory distress. She has wheezes in the right upper field, the right middle field, the right lower field, the left upper field, the left middle field and the left lower field. She has no rales.  Abdominal: Soft. Bowel sounds are normal. She exhibits no distension and no mass. There is no tenderness. There is no rebound and no guarding.  Musculoskeletal: Normal range of motion. She exhibits no edema or tenderness.  Neurological: She is alert and oriented to person, place, and time.  Skin: Skin is warm and dry.  Psychiatric: She has a normal mood and affect.    ED Course  Procedures (including critical care time) Labs Review Labs Reviewed - No data to display  Imaging Review No results found.   EKG Interpretation None      MDM   Final diagnoses:  Asthma exacerbation    Pt is a 44 y.o. female with Pmhx as above  who presents with one day of inc SOB with mild cough & congestion. No fever, CP, leg pain/swelling. She has been using inhalers/neb without improvement and took last home dose of PO pred (20mg ) yesterday. On PE, VSS, pt in NAD, speaks in short, but full sentences. On pulm exam she has expriatory wheezing and prolonged expiratory phase throughout. No LE edema.   Will start with PO pred and duonebs.  0900 After 2 duonebs, pt has improved air mvmt, does not feel improved at this point. Will start 3rd and reexamine  The patient continues to not feel much improved.  She will be admitted to the internal medicine service for further care.  Chest x-ray ordered negative for acute cardiac pulmonary disease.    Ernestina Patches, MD 09/08/14 1719

## 2014-09-08 NOTE — H&P (Signed)
Date: 09/08/2014               Patient Name:  April [name] MRN: [id number]  DOB: 1971-04-16 Age / Sex: 44 y.o., female   PCP: [name], MD         Medical Service: Internal Medicine Teaching Service         Attending Physician: Dr. Madilyn Fireman, MD    First Contact: Reynaldo Minium MS 4 Pager: [phone]  Second Contact: Dr. Ronnald Ramp Pager: [phone]       After Hours (After 5p/  First Contact Pager: [phone]  weekends / holidays): Second Contact Pager: [phone]   Chief Complaint: SOB  History of Present Illness: 44 y/o F w/ PMHx of HTN, Asthma, OSA, and morbid obesity, presented to the ED w/ acute worsening of SOB. Patient states she has been having worsening DOE/SOB for the past day or so w/ accompanying wheeze and mild chest tightness. Patient has a known h/o of moderate/severe persistent asthma, follows w/ Dr. Annamaria Boots. She states she has 2-3 exacerbations per year, has never required intubation. The patient claims she typically has to use her rescue inhaler at least 1-2 times daily and frequently wakes up from sleep w/ wheezing and SOB when she needs to use her inhaler. She has been taking Prednisone 10 mg bid, started by her pulmonologist but says she still has severe asthma symptoms and thinks this is due to seasonal allergies. She typically has exacerbations in the summer and spring related to this. She denies any significant cough, fever, chills, dizziness, palpitations, or chest pain (only tightness). Patient was last admitted in 02/2014 for similar issues and again in 10/2013 at which she left AMA.   Meds: Current Facility-Administered Medications  Medication Dose Route Frequency Provider Last Rate Last Dose  . acetaminophen (TYLENOL) tablet 650 mg  650 mg Oral Q4H PRN [name], MD      . albuterol (PROVENTIL) (2.5 MG/3ML) 0.083% nebulizer solution 2 mL  2 mL Inhalation Q4H PRN [name], MD      . albuterol (PROVENTIL) (2.5 MG/3ML) 0.083% nebulizer solution 2.5 mg  2.5 mg  Nebulization Q2H PRN [name], MD      . cyclobenzaprine (FLEXERIL) tablet 5 mg  5 mg Oral TID PRN [name], MD      . enoxaparin (LOVENOX) injection 40 mg  40 mg Subcutaneous Q24H [name], MD      . hydrochlorothiazide (MICROZIDE) capsule 12.5 mg  12.5 mg Oral Daily [name], MD      . ipratropium-albuterol (DUONEB) 0.5-2.5 (3) MG/3ML nebulizer solution 3 mL  3 mL Nebulization Q4H [name], MD   3 mL at 09/08/14 1529  . mometasone-formoterol (DULERA) 200-5 MCG/ACT inhaler 2 puff  2 puff Inhalation BID [name], MD   2 puff at 09/08/14 1519  . nicotine (NICODERM CQ - dosed in mg/24 hr) patch 7 mg  7 mg Transdermal Daily [name], MD      . pantoprazole (PROTONIX) EC tablet 40 mg  40 mg Oral Daily [name], MD      . Derrill Memo ON 09/09/2014] predniSONE (DELTASONE) tablet 60 mg  60 mg Oral Q breakfast [name], MD      . traMADol Veatrice Bourbon) tablet 50 mg  50 mg Oral Q8H PRN [name], MD      . zolpidem [facility]) tablet 5 mg  5 mg Oral QHS PRN [name],  MD        Allergies: Allergies as of 09/08/2014  . (No Known Allergies)   Past Medical History  Diagnosis Date  . Acanthosis nigricans   . Menorrhagia   . Hypertension, essential   . Insomnia   . Morbid obesity   . Helicobacter pylori (H. pylori) infection   . Sleep apnea     Sleep study 2008 - mild OSA, not enough events to titrate CPAP  . COPD (chronic obstructive pulmonary disease)     PFTs in 2002, FEV1/FVC 65, no post bronchodilater test done  . Asthma     Followed by Dr. Annamaria Boots (pulmonology); receives every other week omalizumab injections; has frequent exacerbations  . Depression   . GERD (gastroesophageal reflux disease)   . Tobacco user   . Obesity   . Shortness of breath   . Headache(784.0)   . Seasonal allergies    Past Surgical History  Procedure Laterality Date  . Tubal ligation  1996    bilateral  . Breast reduction surgery  09/2011   Family History  Problem Relation Age of  Onset  . Asthma Daughter   . Cancer Paternal Aunt   . Asthma Maternal Grandmother   . Hypertension Mother    History   Social History  . Marital Status: Single    Spouse Name: N/A  . Number of Children: 1  . 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 Smoking status: Former Smoker -- 18 years    Quit date: 10/04/2012  . Smokeless tobacco: Never Used  . Alcohol Use: No  . Drug Use: No  . Sexual Activity: Not on file   Other Topics Concern  . 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     Review of Systems  General: Positive for fatigue. Denies fever, diaphoresis, appetite change.  Respiratory: Positive for SOB, DOE, and wheezing. Denies cough.   Cardiovascular: Positive for chest tightness. Denies palpitations.  Gastrointestinal: Denies nausea, vomiting, abdominal pain, and diarrhea.  Musculoskeletal: Positive for back pain. Denies myalgias, arthralgias, and gait problem.  Neurological: Positive for headaches. Denies dizziness, syncope, weakness, lightheadedness.  Psychiatric/Behavioral: Positive for sleep disturbances. Denies mood changes and agitation.   Physical Exam: Blood pressure 144/62, pulse 98, temperature 98.1 F (36.7 C), temperature source Oral, resp. rate 20, last menstrual period 07/25/2014, SpO2 100 %.  General: Obese AA female, alert, cooperative, NAD. Slightly tachypneic on exam.  HEENT: PERRL, EOMI. Moist mucus membranes Neck: Full range of motion without pain, supple, no lymphadenopathy or carotid bruits Lungs: Decreased air entry bilaterally, diffuse end-expiratory wheezes. No rales or rhonchi.  Heart: RRR, no murmurs, gallops, or rubs Abdomen: Soft, obese non-tender, non-distended, BS + Extremities: No cyanosis, clubbing, or edema Neurologic: Alert &  oriented x3, cranial nerves II-XII intact, strength grossly intact, sensation intact to light touch    Lab results: Basic Metabolic Panel:  Recent Labs  09/08/14 1530  NA 137  K 3.8  CL 104  CO2 22  GLUCOSE 175*  BUN 10  CREATININE 0.93  CALCIUM 8.7*  MG 2.2   CBC:  Recent Labs  09/08/14 1530  WBC 14.7*  HGB 12.8  HCT 40.4  MCV 85.8  PLT 397     Imaging results:  Dg Chest 2  View  09/08/2014   CLINICAL DATA:  Asthma.  Dyspnea and wheezing  EXAM: CHEST  2 VIEW  COMPARISON:  08/25/2014  FINDINGS: The heart size and mediastinal contours are within normal limits. Both lungs are clear. The visualized skeletal structures are unremarkable.  IMPRESSION: No active cardiopulmonary disease.   Electronically Signed   By: Franchot Gallo M.D.   On: 09/08/2014 13:09    Other results: EKG: None  Assessment & Plan by Problem: 44 y/o F w/ PMHx of HTN, COPD/Asthma, OSA, and morbid obesity, admitted for acute asthma exacerbation.   Acute Asthma Exacerbation: Worsening SOB over the past 2 days, most likely related to seasonal allergies. States she is hospitalized about 2-3 times per year for asthma, has never been intubated. She seems to describe her asthma as moderate/severe persistent given her inhaler use and night time awakenings. Has not had PFT's since 2002 in which her FEV1/FVC was 65% but no post-bronchodilator testing was performed. Further attempts were made to obtain repeat PFT's, however, this has not been completed. Per chart review, baseline PEF is 350, normal for a woman of her size is ~436. Patient follows w/ Dr. Annamaria Boots, has been taking chronic Prednisone, dose most recently 10 mg bid. She was also started on Xolair which she receives every 2 weeks. CXR in the ED not significant for infiltrate. Given Magnesium Sulfate 2 mg, Prednisone 60 mg, and continuous albuterol nebulizer.  -Admit to med-surg -Continue Prednisone 60 mg in AM; will need long taper on discharge -Continue  DuoNeb q4h scheduled + Albuterol q2h prn -Continue Dulera 2 puffs bid -PEF before and after breathing treatment  HTN: BP elevated on admission, 716'R systolic. Decreased w/ recheck.  -Continue HCTZ 12.5 mg daily  GERD: Stable -Protonix  Chronic Back Pain: Stable -Flexeril + Tramadol + Tylenol prn  Tobacco Abuse: Patient states she smoke 3-5 cigarettes daily. Discussed importance of quitting at length given the severity of her Asthma.  -Nicoderm 7 mg patch  Dispo: Disposition is deferred at this time, awaiting improvement of current medical problems. Anticipated discharge in approximately 1-2 day(s). Patient is high risk for leaving AMA.   The patient does have a current PCP (Tasrif Ahmed, MD) and does need an [facility] hospital follow-up appointment after discharge.  The patient does not have transportation limitations that hinder transportation to clinic appointments.  Signed: [name], MD 09/08/2014, 4:50 PM

## 2014-09-08 NOTE — H&P (Signed)
History and Physical  Date: 09/08/2014               Patient Name:  April [name] MRN: [id number]  DOB: 10-15-1970 Age / Sex: 44 y.o., female   PCP: [name], MD         Medical Service: Internal Medicine Teaching Service         Attending Physician: Dr. Madilyn Fireman, MD    First Contact: Reynaldo Minium Pager: [phone]  Second Contact: Dr. Ronnald Ramp Pager: [phone]       After Hours (After 5p/  First Contact Pager: [phone]  weekends / holidays): Second Contact Pager: [phone]   Chief Complaint: wheezing/dyspnea  History of Present Illness: April [name] is a 44 y.o. F with PMH significant for asthma, OSA, morbid obesity (BMI 55.2), GERD, HTN, chronic LBP who p/w acute SOB/DOE with  1 day h/o wheezing, consistent with her typical asthma exacerbation. She reports onset of SOB last night 09/07/2014. She used her home albuterol nebulizer without relief. She feels SOB with any effort. She cannot pinpoint an exact number of uses of her albuterol rescue inhaler and nebulizer but reports that she was using it "a lot" and used it with any activity. Her SOB is also worse with lying down, worst if she lies on her R side. She has been wheezing and feels congested. She endorses cough which is nonproductive. The patient has similar episodes requiring hospitalization 2-3 times yearly. She has never been intubated for asthma. At baseline, she wakes up nightly with SOB and has to use her rescue inhaler. She denies orthopnea. Stress, exercise, and seasonal allergies (summer) trigger her asthma exacerbations. She cannot identify a clear trigger preceding this episode. She previously smoked and quit for "years," but has begun smoking again, 3 cigarettes daily.  The patient was prescribed prednisone 10 mg BID on 4/1/ from Dr. Annamaria Boots and has been on this since (was mistakenly taking 20 BID for about a month). She was scheduled to see Dr. Annamaria Boots tomorrow 09/09/2014. Her baseline peak flow is around 350, noted in 06/2013  (predicted value for age/height is 436). She endorses wt gain 2/2 steroid use.  Meds: No current facility-administered medications for this encounter.   Current Outpatient Prescriptions  Medication Sig Dispense Refill  . albuterol (PROAIR HFA) 108 (90 BASE) MCG/ACT inhaler Inhale 2 puffs into the lungs every 4 (four) hours as needed for wheezing or shortness of breath. 8.5 g 2  . albuterol (PROVENTIL) (2.5 MG/3ML) 0.083% nebulizer solution Take 3 mLs (2.5 mg total) by nebulization every 4 (four) hours as needed for wheezing or shortness of breath. 30 vial 11  . hydrochlorothiazide (MICROZIDE) 12.5 MG capsule Take 1 capsule (12.5 mg total) by mouth daily. 30 capsule 6  . ibuprofen (MOTRIN IB) 200 MG tablet Take 4 tablets (800 mg total) by mouth every 8 (eight) hours as needed for moderate pain. 30 tablet 1  . mometasone-formoterol (DULERA) 200-5 MCG/ACT AERO 2 puffs then rinse mouth, twice daily (Patient taking differently: Inhale 2 puffs into the lungs 2 (two) times daily. Rinse mouth after inhalation) 1 Inhaler prn  . omalizumab (XOLAIR) 150 MG injection Inject 150 mg into the skin every 14 (fourteen) days.    [name] omeprazole (PRILOSEC) 20 MG capsule Take 20 mg by mouth 2 (two) times daily before a meal.    . predniSONE (DELTASONE) 10 MG tablet Take 1 tablet (10 mg total) by mouth 2 (two) times daily with a meal. 50 tablet 0  . traMADol (  ULTRAM) 50 MG tablet 1-2 tabs po q 6 hr prn pain Maximum dose= 8 tablets per day (Patient taking differently: Take 50 mg by mouth every 6 (six) hours as needed for moderate pain or severe pain. ) 30 tablet 0  . zolpidem (AMBIEN) 10 MG tablet Take 10 mg by mouth at bedtime as needed for sleep.    . cyclobenzaprine (FLEXERIL) 5 MG tablet Take 1 tablet (5 mg total) by mouth 3 (three) times daily as needed for muscle spasms. (Patient not taking: Reported on 08/22/2014) 30 tablet 0  . meloxicam (MOBIC) 7.5 MG tablet Take 1 tablet (7.5 mg total) by mouth daily. (Patient not  taking: Reported on 08/22/2014) 12 tablet 0  . predniSONE (DELTASONE) 20 MG tablet Take 0.5 tablets (10 mg total) by mouth daily. Start 07/23/14 (Patient not taking: Reported on 09/08/2014) 30 tablet 0    Allergies: Allergies as of 09/08/2014  . (No Known Allergies)   Past Medical History  Diagnosis Date  . Acanthosis nigricans   . Menorrhagia   . Hypertension, essential   . Insomnia   . Morbid obesity   . Helicobacter pylori (H. pylori) infection   . Sleep apnea     Sleep study 2008 - mild OSA, not enough events to titrate CPAP  . COPD (chronic obstructive pulmonary disease)     PFTs in 2002, FEV1/FVC 65, no post bronchodilater test done  . Asthma     Followed by Dr. Annamaria Boots (pulmonology); receives every other week omalizumab injections; has frequent exacerbations  . Depression   . GERD (gastroesophageal reflux disease)   . Tobacco user   . Obesity   . Shortness of breath   . Headache(784.0)   . Seasonal allergies    Past Surgical History  Procedure Laterality Date  . Tubal ligation  1996    bilateral  . Breast reduction surgery  09/2011   Family History  Problem Relation Age of Onset  . Asthma Daughter   . Cancer Paternal Aunt   . Asthma Maternal Grandmother   . Hypertension Mother    History   Social History  . Marital Status: Single    Spouse Name: N/A  . Number of Children: 1  . 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 Smoking status: Former Smoker -- 18 years    Quit date: 10/04/2012  . Smokeless tobacco: Never Used  . Alcohol Use: No  . Drug Use: No  . Sexual Activity: Not on file   Other Topics Concern  . 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     Review of Systems: Pertinent items are noted in HPI.  Physical Exam: Blood pressure 148/86, pulse  96, temperature 98.2 F (36.8 C), temperature source Oral, resp. rate 24, last menstrual period 07/25/2014, SpO2 99 %. BP 148/86 mmHg  Pulse 96  Temp(Src) 98.2 F (36.8 C) (Oral)  Resp 24  SpO2 99%  LMP 07/25/2014  General Appearance:    Alert, conversant, morbidly obese, mildly SOB  HEENT:   Sclerae anicteric, MMM  Lungs:     Decreased breath sounds over bilateral lung fields, prominent expiratory wheezing   Heart:    Heart sounds distant, rrr, no m/r/g appreciated  Abdomen:     Obese, nontender, +BS  Extremities:  Extremities normal, atraumatic, no cyanosis or edema   Lab results: NONE  Imaging results:  Dg Chest 2 View  09/08/2014   CLINICAL DATA:  Asthma.  Dyspnea and wheezing  EXAM: CHEST  2 VIEW  COMPARISON:  08/25/2014  FINDINGS: The heart size and mediastinal contours are within normal limits. Both lungs are clear. The visualized skeletal structures are unremarkable.  IMPRESSION: No active cardiopulmonary disease.   Electronically Signed   By: Franchot Gallo M.D.   On: 09/08/2014 13:09   Assessment & Plan by Problem: Active Problems:   Asthma exacerbation  Asthma Exacerbation: Pt likely has moderate to severe persistent asthma, given frequent rescue inhaler use with weekly nighttime awakenings. Home meds: albuterol inhaler/nebulizer, Dulera (mometasone-formoterol), Xolair injection q2wks (last: 09/02/2014). SpO2 100 on RA in ED. CXR clear. - Duonebs q4hr - Albuterol nebs q2hr PRN - Prednisone 60 mg daily x 2 days, then start taper to home dose of 10 mg BID - Continue home mometasone-formoterol - Pt will f/u with Dr. Annamaria Boots after d/c  Lower Back Pain, chronic: Home meds: Flexeril 5 mg TID PRN, tramadol 1-2 q6hr PRN. Previously on Percocet 10 mg every other day. - Continue home tramadol, Flexeril - APAP 650 mg q4hr PRN  Hypertension: Continue home HCTZ 12.5 mg daily  GERD: Protonix 40 mg  Insomnia: Home meds: Ambien 10 mg qHS PRN - Hold Ambien for now  F/E/N: -  None - BMP pending. Replenish lytes PRN - H/H diet  PPX: Lovenox for DVT PPX  Dispo: Disposition is deferred at this time, awaiting improvement of current medical problems. Anticipated discharge in approximately 1-2 day(s).   The patient does have a current PCP (Tasrif Ahmed, MD) and does need an [facility] hospital follow-up appointment after discharge.  The patient does not have transportation limitations that hinder transportation to clinic appointments.  Signed: Susa Day, Med Student 09/08/2014, 1:42 PM

## 2014-09-08 NOTE — ED Notes (Signed)
PT returned from bathroom. Monitored by pulse ox, bp cuff, and 5-lead.

## 2014-09-08 NOTE — ED Notes (Signed)
Patient transported to X-ray 

## 2014-09-08 NOTE — Progress Notes (Signed)
Placed patient on CPAP via auto-mode for the night. Minimum pressure set at 5cm and maximum pressure set at 20cm

## 2014-09-08 NOTE — ED Notes (Signed)
Pt. States she has been up all night "not breathing right." Hx of asthma. Pt. Has been using nebulizers and inhalers at home. Last txt around 5 AM.

## 2014-09-08 NOTE — ED Notes (Signed)
Pt placed in gown and in bed. Pt monitored by pulse ox, bp cuff, and 5-lead. MD Docherty at bedside.

## 2014-09-09 LAB — BASIC METABOLIC PANEL
Anion gap: 9 (ref 5–15)
BUN: 10 mg/dL (ref 6–20)
CHLORIDE: 102 mmol/L (ref 101–111)
CO2: 25 mmol/L (ref 22–32)
Calcium: 8.7 mg/dL — ABNORMAL LOW (ref 8.9–10.3)
Creatinine, Ser: 0.82 mg/dL (ref 0.44–1.00)
GFR calc Af Amer: >60 mL/min (ref >60)
GFR calc non Af Amer: >60 mL/min (ref >60)
Glucose, Bld: 94 mg/dL (ref 65–99)
Potassium: 3.1 mmol/L — ABNORMAL LOW (ref 3.5–5.1)
Sodium: 136 mmol/L (ref 135–145)

## 2014-09-09 MED ORDER — ALBUTEROL SULFATE HFA 108 (90 BASE) MCG/ACT IN AERS: 1.0000 | INHALATION_SPRAY | RESPIRATORY_TRACT | Status: DC | PRN

## 2014-09-09 MED ORDER — HYDROCHLOROTHIAZIDE 12.5 MG PO CAPS: 12.5000 mg | ORAL_CAPSULE | Freq: Every day | ORAL | Status: DC

## 2014-09-09 MED ORDER — ENOXAPARIN SODIUM 80 MG/0.8ML ~~LOC~~ SOLN
75.0000 mg | SUBCUTANEOUS | Status: DC
  Administered 2014-09-09: 75 mg via SUBCUTANEOUS
  Filled 2014-09-09: qty 0.8

## 2014-09-09 MED ORDER — PREDNISONE 10 MG PO TABS: ORAL_TABLET | ORAL | Status: DC

## 2014-09-09 MED ORDER — POTASSIUM CHLORIDE CRYS ER 20 MEQ PO TBCR: 40.0000 meq | EXTENDED_RELEASE_TABLET | Freq: Once | ORAL | Status: DC

## 2014-09-09 MED ORDER — PREDNISONE 10 MG PO TABS: 10.0000 mg | ORAL_TABLET | Freq: Two times a day (BID) | ORAL | Status: DC

## 2014-09-09 MED ORDER — MOMETASONE FURO-FORMOTEROL FUM 200-5 MCG/ACT IN AERO: 2.0000 | INHALATION_SPRAY | Freq: Two times a day (BID) | RESPIRATORY_TRACT | Status: DC

## 2014-09-09 MED ORDER — ALBUTEROL SULFATE (2.5 MG/3ML) 0.083% IN NEBU
2.5000 mg | INHALATION_SOLUTION | RESPIRATORY_TRACT | Status: DC | PRN
  Administered 2014-09-09: 2.5 mg via RESPIRATORY_TRACT
  Filled 2014-09-09: qty 3

## 2014-09-09 NOTE — Progress Notes (Signed)
Patient Discharge: Disposition: Patient discharged to home. Education: Reviewed medications, prescriptions, follow up appointments, and discharge instructions, understood and acknowledged. IV: Discontinued before discharge. Telemetry: N/A  Prescriptions: Match letter given to patient. Transportation: Patient transported in w/c with the staff. Belongings: Patient took all her belongings with her.

## 2014-09-09 NOTE — Discharge Summary (Signed)
Name: April [name] MRN: [id number] DOB: June 27, 1970 44 y.o. PCP: [name], MD  Date of Admission: 09/08/2014  7:46 AM Date of Discharge: 09/09/2014 Attending Physician: Madilyn Fireman, MD  Discharge Diagnosis:  Active Problems:   Asthma exacerbation   Morbid obesity  Discharge Medications:   Medication List    TAKE these medications        albuterol (2.5 MG/3ML) 0.083% nebulizer solution  Commonly known as:  PROVENTIL  Take 3 mLs (2.5 mg total) by nebulization every 4 (four) hours as needed for wheezing or shortness of breath.     albuterol 108 (90 BASE) MCG/ACT inhaler  Commonly known as:  PROAIR HFA  Inhale 2 puffs into the lungs every 4 (four) hours as needed for wheezing or shortness of breath.     albuterol 108 (90 BASE) MCG/ACT inhaler  Commonly known as:  PROVENTIL HFA;VENTOLIN HFA  Inhale 1-2 puffs into the lungs every 4 (four) hours as needed for wheezing or shortness of breath.     cyclobenzaprine 5 MG tablet  Commonly known as:  FLEXERIL  Take 1 tablet (5 mg total) by mouth 3 (three) times daily as needed for muscle spasms.     hydrochlorothiazide 12.5 MG capsule  Commonly known as:  MICROZIDE  Take 1 capsule (12.5 mg total) by mouth daily.     ibuprofen 200 MG tablet  Commonly known as:  MOTRIN IB  Take 4 tablets (800 mg total) by mouth every 8 (eight) hours as needed for moderate pain.     meloxicam 7.5 MG tablet  Commonly known as:  MOBIC  Take 1 tablet (7.5 mg total) by mouth daily.     mometasone-formoterol 200-5 MCG/ACT Aero  Commonly known as:  DULERA  Inhale 2 puffs into the lungs 2 (two) times daily. Rinse mouth after inhalation     omalizumab 150 MG injection  Commonly known as:  XOLAIR  Inject 150 mg into the skin every 14 (fourteen) days.     omeprazole 20 MG capsule  Commonly known as:  PRILOSEC  Take 20 mg by mouth 2 (two) times daily before a meal.     predniSONE 10 MG tablet  Commonly known as:  DELTASONE  Take 1 tablet (10  mg total) by mouth 2 (two) times daily with a meal.     predniSONE 10 MG tablet  Commonly known as:  DELTASONE  Take 50 mg daily for 5 days, then 40 mg daily for 5 days, then 30 mg daily for 5 days, then resume 10 mg twice daily thereafter.     traMADol 50 MG tablet  Commonly known as:  ULTRAM  1-2 tabs po q 6 hr prn pain Maximum dose= 8 tablets per day     zolpidem 10 MG tablet  Commonly known as:  AMBIEN  Take 10 mg by mouth at bedtime as needed for sleep.        Disposition and follow-up:   [name] was discharged from [facility] in Good condition.  At the hospital follow up visit please address:  1.  Pulmonary exam; Is patient using her inhalers? Has she been able to afford them?  F/u w/ Dr. Annamaria Boots?  2.  Labs / imaging needed at time of follow-up: None  3.  Pending labs/ test needing follow-up: none  Follow-up Appointments:     Follow-up Information    Follow up with Deneise Lever, MD On 09/20/2014.   Specialty:  Pulmonary Disease  Why:  10:15 AM   Contact information:   [address] [place] [address] [phone]       Follow up with Clinton Gallant, MD On 09/15/2014.   Specialty:  Internal Medicine   Why:  10:15 AM   Contact information:   [place] [address] [phone]       Procedures Performed:  Dg Chest 2 View  09/08/2014   CLINICAL DATA:  Asthma.  Dyspnea and wheezing  EXAM: CHEST  2 VIEW  COMPARISON:  08/25/2014  FINDINGS: The heart size and mediastinal contours are within normal limits. Both lungs are clear. The visualized skeletal structures are unremarkable.  IMPRESSION: No active cardiopulmonary disease.   Electronically Signed   By: Franchot Gallo M.D.   On: 09/08/2014 13:09   Dg Chest 2 View (if Patient Has Fever And/or Copd)  08/25/2014   CLINICAL DATA:  Shortness of breath and cough.  EXAM: CHEST  2 VIEW  COMPARISON:  PA and lateral chest 06/06/2014 03/27/2014.  FINDINGS: The lungs are clear. Heart  size is normal. No pneumothorax or pleural effusion is identified. No focal bony abnormality is seen.  IMPRESSION: Negative chest.   Electronically Signed   By: Inge Rise M.D.   On: 08/25/2014 16:18    Admission HPI: April [name] is a 44 y/o F w/ PMHx of HTN, Asthma, OSA, and morbid obesity who presented to the ED w/ acute worsening of SOB. Patient stated she had been having worsening DOE/SOB for the past day or so w/ accompanying wheeze and mild chest tightness. Patient has a known h/o of moderate/severe persistent asthma, follows w/ Dr. Annamaria Boots. She stated she has 2-3 exacerbations per year, has never required intubation. The patient claimed she typically has to use her rescue inhaler at least 1-2 times daily and frequently wakes up from sleep w/ wheezing and SOB when she needs to use her inhaler. She has been taking Prednisone 10 mg bid, started by her pulmonologist but says she still has severe asthma symptoms and thinks this is due to seasonal allergies. She typically has exacerbations in the summer and spring related to this. She denies any significant cough, fever, chills, dizziness, palpitations, or chest pain (only tightness). Patient was last admitted in 02/2014 for similar issues and again in 10/2013 at which she left AMA.   The patient was started on prednisone taper and received DuoNeb treatments as well as her home medications. She was discharged to home on 09/09/2014. On the day of discharge, the patient was in stable condition and reported improved SOB/DOE. She continued to deny chest pain, fever/chills, dizziness, or palpitations. No procedures were performed during the course of this hospitalization.  Hospital Course by problem list:  1. Asthma Exacerbation: The patient was started on Prednisone 60 mg x 2 days followed by gradual taper to home dose (10 mg BID). She received Duonebs and was continued on her home Dulera and PRN Albuterol. She will follow up with her pulmonologist Dr. Annamaria Boots  after discharge for management of her home meds and potentially PFTs.  2. Lower Back Pain, chronic: The patient received her home Tramadol and Flexeril, as well as Tylenol, during this hospitalization.  3. Hypertension: Stable on home HCTZ.  4. GERD: The patient received Protonix for GERD during this hospitalization.  5. Obstructive Sleep Apnea: the patient slept with CPAP during this hospitalization.  6. Tobacco Abuse: The patient recently restarted smoking, currently approximately 3 cigarettes daily. She requested and received nicotine  patch during this hospitalization. Smoking cessation was emphasized given her frequent asthma exacerbations.   Discharge Vitals:   BP 153/76 mmHg  Pulse 94  Temp(Src) 98.4 F (36.9 C) (Oral)  Resp 20  Wt 332 lb 4.8 oz (150.73 kg)  SpO2 99%  LMP 07/25/2014  Discharge Labs:  Results for orders placed or performed during the hospital encounter of 09/08/14 (from the past 24 hour(s))  Basic metabolic panel     Status: Abnormal   Collection Time: 09/08/14  3:30 PM  Result Value Ref Range   Sodium 137 135 - 145 mmol/L   Potassium 3.8 3.5 - 5.1 mmol/L   Chloride 104 101 - 111 mmol/L   CO2 22 22 - 32 mmol/L   Glucose, Bld 175 (H) 65 - 99 mg/dL   BUN 10 6 - 20 mg/dL   Creatinine, Ser 0.93 0.44 - 1.00 mg/dL   Calcium 8.7 (L) 8.9 - 10.3 mg/dL   GFR calc non Af Amer >60 >60 mL/min   GFR calc Af Amer >60 >60 mL/min   Anion gap 11 5 - 15  Magnesium     Status: None   Collection Time: 09/08/14  3:30 PM  Result Value Ref Range   Magnesium 2.2 1.7 - 2.4 mg/dL  CBC     Status: Abnormal   Collection Time: 09/08/14  3:30 PM  Result Value Ref Range   WBC 14.7 (H) 4.0 - 10.5 K/uL   RBC 4.71 3.87 - 5.11 MIL/uL   Hemoglobin 12.8 12.0 - 15.0 g/dL   HCT 40.4 36.0 - 46.0 %   MCV 85.8 78.0 - 100.0 fL   MCH 27.2 26.0 - 34.0 pg   MCHC 31.7 30.0 - 36.0 g/dL   RDW 16.8 (H) 11.5 - 15.5 %   Platelets 397 150 - 400 K/uL  Basic metabolic panel     Status: Abnormal     Collection Time: 09/09/14  4:47 AM  Result Value Ref Range   Sodium 136 135 - 145 mmol/L   Potassium 3.1 (L) 3.5 - 5.1 mmol/L   Chloride 102 101 - 111 mmol/L   CO2 25 22 - 32 mmol/L   Glucose, Bld 94 65 - 99 mg/dL   BUN 10 6 - 20 mg/dL   Creatinine, Ser 0.82 0.44 - 1.00 mg/dL   Calcium 8.7 (L) 8.9 - 10.3 mg/dL   GFR calc non Af Amer >60 >60 mL/min   GFR calc Af Amer >60 >60 mL/min   Anion gap 9 5 - 15   *Note: Due to a large number of results and/or encounters for the requested time period, some results have not been displayed. A complete set of results can be found in Results Review.    Signed: [name], MD 09/09/2014, 2:27 PM    Services Ordered on Discharge: None Equipment Ordered on Discharge: none

## 2014-09-09 NOTE — Progress Notes (Signed)
Inpatient Progress Note - Internal Medicine  Subjective: Today the patient reports feeling mildly better. She reports being able to walk around the room well with no DOE. She endorses continued cough and is coughing up some thick white sputum. She denies orthopnea. She reports sleeping poorly, with frequent nighttime awakenings for urination (she was given HCTZ before bedtime) or for difficulty staying asleep (on Ambien for insomnia at home). She used CPAP o/n, which she does not wear at home; she reports it helped her sleep. She is requesting Percocet for her lower back pain this morning.  Objective: Vital signs in last 24 hours: Filed Vitals:   09/08/14 2108 09/08/14 2214 09/09/14 0451 09/09/14 0952  BP: 188/99  144/100 153/76  Pulse: 88 91 97 94  Temp: 97.9 F (36.6 C)  98.5 F (36.9 C) 98.4 F (36.9 C)  TempSrc: Oral  Oral Oral  Resp: 21 15 20 20   Weight: 332 lb 4.8 oz (150.73 kg)     SpO2: 100% 100% 100% 100%   Weight change:   Intake/Output Summary (Last 24 hours) at 09/09/14 1059 Last data filed at 09/09/14 0900  Gross per 24 hour  Intake    960 ml  Output   2300 ml  Net  -1340 ml   GEN:   Calm, alert, conversant, NAD, no obvious dyspnea with conversation CV:   distant heart sounds, S1S2 normal, no m/r/g PULM: Mildly decreased breath sounds bilaterally, improved from yesterday. Prominent expiratory wheezes GI:  Soft, obese, nontender. +BS EXT:   No pedal edema  Lab Results: Basic Metabolic Panel:  Recent Labs Lab 09/08/14 1530 09/09/14 0447  NA 137 136  K 3.8 3.1*  CL 104 102  CO2 22 25  GLUCOSE 175* 94  BUN 10 10  CREATININE 0.93 0.82  CALCIUM 8.7* 8.7*  MG 2.2  --    CBC:  Recent Labs Lab 09/08/14 1530  WBC 14.7*  HGB 12.8  HCT 40.4  MCV 85.8  PLT 397    Studies/Results: Dg Chest 2 View  09/08/2014   CLINICAL DATA:  Asthma.  Dyspnea and wheezing  EXAM: CHEST  2 VIEW  COMPARISON:  08/25/2014  FINDINGS: The heart size and mediastinal contours  are within normal limits. Both lungs are clear. The visualized skeletal structures are unremarkable.  IMPRESSION: No active cardiopulmonary disease.   Electronically Signed   By: Franchot Gallo M.D.   On: 09/08/2014 13:09   Medications: I have reviewed the patient's current medications. Scheduled Meds: . enoxaparin (LOVENOX) injection  75 mg Subcutaneous Q24H  . hydrochlorothiazide  12.5 mg Oral Daily  . ipratropium-albuterol  3 mL Nebulization Q4H  . mometasone-formoterol  2 puff Inhalation BID  . nicotine  7 mg Transdermal Daily  . pantoprazole  40 mg Oral Daily  . potassium chloride  40 mEq Oral Once  . predniSONE  60 mg Oral Q breakfast   Continuous Infusions:  PRN Meds:.acetaminophen, albuterol, albuterol, cyclobenzaprine, traMADol, zolpidem Assessment/Plan: Active Problems:   Asthma exacerbation   Morbid obesity 44 y.o. f PMH asthma, morbid obesity, OSA, HTN, GERD, chronic LBP p/w 1 day SOB/DOE c/w her usual asthma exacerbation.  Asthma Exacerbation: Pt likely has moderate to severe persistent asthma, given frequent rescue inhaler use with weekly nighttime awakenings. Home meds: albuterol inhaler/nebulizer, Dulera (mometasone-formoterol), Xolair injection q2wks (last: 09/02/2014). SpO2 100 on RA in ED. CXR clear. - Duonebs q4hr - Albuterol nebs q2hr PRN - Today is day 2 of 2 for Prednisone 60 mg: next start taper  to home dose of 10 mg BID - Continue home Dulera (mometasone-formoterol) - Pt will f/u with Dr. Annamaria Boots after d/c  Lower Back Pain, chronic: Home meds: Flexeril 5 mg TID PRN, tramadol 1-2 q6hr PRN. Previously on Percocet 10 mg every other day. - Continue home tramadol, Flexeril - APAP 650 mg q4hr PRN  Hypertension: Continue home HCTZ 12.5 mg daily  GERD: Protonix 40 mg  Insomnia: Home meds: Ambien 10 mg qHS PRN - Hold Ambien  F/E/N: - None - BMP WNL - H/H diet  PPX: Lovenox for DVT PPX  Dispo: Anticipate discharge today.  The patient does have a current PCP  (Tasrif Ahmed, MD) and does need an [facility] hospital follow-up appointment after discharge.  The patient does not have transportation limitations that hinder transportation to clinic appointments.  .Services Needed at time of discharge: Y = Yes, Blank = No PT:   OT:   RN:   Equipment:   Other:     LOS: 1 day   Susa Day, Med Student 09/09/2014, 10:59 AM

## 2014-09-09 NOTE — Discharge Instructions (Signed)
1. You have a follow up appointment as follows:  Deneise Lever, MD  On 09/20/2014 10:15 AM  [address] [place] [address] [phone]  Jerrye Noble, MD  On 09/15/2014 10:15 AM  [address] [place] [address] [phone]  2. Please take all medications as previously prescribed with the following changes:  Continue Prednisone 50 mg daily for 5 days, 40 mg daily for 5 days, 30 mg daily for 5 days, then continue 10 mg twice daily as previously prescribed by Dr. Annamaria Boots.   Continue Albuterol Inhaler as needed + Dulera   3. If you have worsening of your symptoms or new symptoms arise, please call the clinic [phone]), or go to the ER immediately if symptoms are severe.  You have done a great job in taking all your medications. Please continue to do this.

## 2014-09-09 NOTE — Progress Notes (Signed)
Patient's ambulated in the hall way and her pulse ox was inbetween 96-98%.

## 2014-09-09 NOTE — Progress Notes (Signed)
Subjective: Doing well this AM, no tachypnea, breath sounds improved. Still w/ end-expiratory wheeze.   Objective: Vital signs in last 24 hours: Filed Vitals:   09/08/14 1922 09/08/14 2108 09/08/14 2214 09/09/14 0451  BP:  188/99  144/100  Pulse:  88 91 97  Temp:  97.9 F (36.6 C)  98.5 F (36.9 C)  TempSrc:  Oral  Oral  Resp:  21 15 20   Weight:  332 lb 4.8 oz (150.73 kg)    SpO2: 97% 100% 100% 100%   Weight change:   Intake/Output Summary (Last 24 hours) at 09/09/14 0644 Last data filed at 09/09/14 0500  Gross per 24 hour  Intake    720 ml  Output   1450 ml  Net   -730 ml   Physical Exam: General: Obese AA female, alert, cooperative, NAD.  HEENT: PERRL, EOMI. Moist mucus membranes Neck: Full range of motion without pain, supple, no lymphadenopathy or carotid bruits Lungs: Slightly decreased air entry bilaterally, diffuse end-expiratory wheezes, improved. No rales or rhonchi.  Heart: RRR, no murmurs, gallops, or rubs Abdomen: Soft, obese non-tender, non-distended, BS + Extremities: No cyanosis, clubbing, or edema Neurologic: Alert & oriented x3, cranial nerves II-XII intact, strength grossly intact, sensation intact to light touch   Lab Results: Basic Metabolic Panel:  Recent Labs Lab 09/08/14 1530 09/09/14 0447  NA 137 136  K 3.8 3.1*  CL 104 102  CO2 22 25  GLUCOSE 175* 94  BUN 10 10  CREATININE 0.93 0.82  CALCIUM 8.7* 8.7*  MG 2.2  --    CBC:  Recent Labs Lab 09/08/14 1530  WBC 14.7*  HGB 12.8  HCT 40.4  MCV 85.8  PLT 397   Urine Drug Screen: Drugs of Abuse     Component Value Date/Time   LABOPIA NONE DETECTED 03/04/2010 1242   COCAINSCRNUR NONE DETECTED 03/04/2010 1242   LABBENZ NONE DETECTED 03/04/2010 1242   AMPHETMU NONE DETECTED 03/04/2010 1242   THCU NONE DETECTED 03/04/2010 1242   LABBARB  03/04/2010 1242    NONE DETECTED        DRUG SCREEN FOR MEDICAL PURPOSES ONLY.  IF CONFIRMATION IS NEEDED FOR ANY PURPOSE, NOTIFY  LAB WITHIN 5 DAYS.        LOWEST DETECTABLE LIMITS FOR URINE DRUG SCREEN Drug Class       Cutoff (ng/mL) Amphetamine      1000 Barbiturate      200 Benzodiazepine   782 Tricyclics       956 Opiates          300 Cocaine          300 THC              50    Studies/Results: Dg Chest 2 View  09/08/2014   CLINICAL DATA:  Asthma.  Dyspnea and wheezing  EXAM: CHEST  2 VIEW  COMPARISON:  08/25/2014  FINDINGS: The heart size and mediastinal contours are within normal limits. Both lungs are clear. The visualized skeletal structures are unremarkable.  IMPRESSION: No active cardiopulmonary disease.   Electronically Signed   By: Franchot Gallo M.D.   On: 09/08/2014 13:09   Medications: I have reviewed the patient's current medications. Scheduled Meds: . enoxaparin (LOVENOX) injection  40 mg Subcutaneous Q24H  . hydrochlorothiazide  12.5 mg Oral Daily  . ipratropium-albuterol  3 mL Nebulization Q4H  . mometasone-formoterol  2 puff Inhalation BID  . nicotine  7 mg Transdermal Daily  . pantoprazole  40  mg Oral Daily  . predniSONE  60 mg Oral Q breakfast   Continuous Infusions:  PRN Meds:.acetaminophen, albuterol, albuterol, cyclobenzaprine, traMADol, zolpidem   Assessment/Plan: 44 y/o F w/ PMHx of HTN, COPD/Asthma, OSA, and morbid obesity, admitted for acute asthma exacerbation.   Acute Asthma Exacerbation: Not requiring O2 currently, not tachypneic on exam. Breath sounds improved, still w/ wheeze.  -Check PEF, ambulate w/ pulse oximetry -Continue Prednisone 60 mg today; discharge w/ 50-40-30-20 mg daily taper for 5 days each (will remain on 10 mg bid at end of taper as this is what she was taking prior to admission). -Continue DuoNeb q4h scheduled + Albuterol q2h prn -Continue Dulera 2 puffs bid -Follow up w/ PCP, Dr. Annamaria Boots in 1-2 weeks.   HTN: Stable, slightly elevated.  -Continue HCTZ 12.5 mg daily  GERD: Stable -Protonix  Chronic Back Pain: Stable -Flexeril + Tramadol + Tylenol  prn  Tobacco Abuse: Patient states she smoke 3-5 cigarettes daily. Discussed importance of quitting at length given the severity of her Asthma.  -Nicoderm 7 mg patch  Dispo: Disposition is deferred at this time, awaiting improvement of current medical problems. Anticipated discharge today.   The patient does have a current PCP (Tasrif Ahmed, MD) and does need an [facility] hospital follow-up appointment after discharge.  The patient does not have transportation limitations that hinder transportation to clinic appointments.  .Services Needed at time of discharge: Y = Yes, Blank = No PT:   OT:   RN:   Equipment:   Other:     LOS: 1 day   [name], MD 09/09/2014, 6:44 AM

## 2014-09-09 NOTE — Care Management Note (Signed)
Case Management Note  Patient Details  Name: April [name] MRN: [id number] Date of Birth: 01-11-1971  Subjective/Objective:           CM following for progression and d/c planning.         Action/Plan: 09/09/2014 Met with pt re d/c needs, this pt is uninsured, therefore [place] letter given and explained to pt . Pt is active with [facility] however does not have an [employer] as she has not provided information to that Clinic to [employer] the application. Pt was also given information on the Medication Assistance Program in the [facility] Dept to assist with obtaining medications.   Expected Discharge Date:       09/09/2014           Expected Discharge Plan:  Home/Self Care  In-House Referral:     Discharge planning Services  CM Consult, [facility] Program, Medication Assistance  Post Acute Care Choice:  NA Choice offered to:  NA  DME Arranged:    DME Agency:     HH Arranged:    HH Agency:     Status of Service:     Medicare Important Message Given:  No Date Medicare IM Given:   no Medicare IM give by:    Date Additional Medicare IM Given:    Additional Medicare Important Message give by:     If discussed at [place] of Stay Meetings, dates discussed:    Additional Comments:  Adron Bene, RN 09/09/2014, 3:15 PM

## 2014-09-14 ENCOUNTER — Telehealth: Payer: Self-pay | Admitting: Internal Medicine

## 2014-09-14 NOTE — Telephone Encounter (Signed)
Call to patient to confirm appointment for 09/15/14 at 10:15 lmtcb ° °

## 2014-09-14 NOTE — Telephone Encounter (Signed)
I returned  [facility] call, they are going to send her next shipment 09/15/14 I think rep said next wk. And then said her Arvid Right would be del. 09/15/14. So if we don't get this week I will call them back.ai'm going to leave encounter open til Thurs.[name]

## 2014-09-15 ENCOUNTER — Inpatient Hospital Stay (HOSPITAL_COMMUNITY)
Admission: AD | Admit: 2014-09-15 | Discharge: 2014-09-16 | DRG: 202 | Disposition: A | Discharge status: Home / Self Care | Payer: Self-pay | Source: Ambulatory Visit | Attending: Internal Medicine | Admitting: Internal Medicine

## 2014-09-15 ENCOUNTER — Ambulatory Visit (INDEPENDENT_AMBULATORY_CARE_PROVIDER_SITE_OTHER): Payer: Self-pay | Admitting: Internal Medicine

## 2014-09-15 ENCOUNTER — Inpatient Hospital Stay (HOSPITAL_COMMUNITY): Payer: Self-pay

## 2014-09-15 ENCOUNTER — Encounter (HOSPITAL_COMMUNITY): Payer: Self-pay | Admitting: *Deleted

## 2014-09-15 ENCOUNTER — Encounter: Payer: Self-pay | Admitting: Internal Medicine

## 2014-09-15 ENCOUNTER — Telehealth: Payer: Self-pay | Admitting: Internal Medicine

## 2014-09-15 VITALS — BP 179/108 | HR 85 | Temp 98.1°F | Ht 66.0 in | Wt 336.8 lb

## 2014-09-15 DIAGNOSIS — K219 Gastro-esophageal reflux disease without esophagitis: Secondary | ICD-10-CM

## 2014-09-15 DIAGNOSIS — J4551 Severe persistent asthma with (acute) exacerbation: Principal | ICD-10-CM

## 2014-09-15 DIAGNOSIS — G4733 Obstructive sleep apnea (adult) (pediatric): Secondary | ICD-10-CM | POA: Diagnosis present

## 2014-09-15 DIAGNOSIS — Z9989 Dependence on other enabling machines and devices: Secondary | ICD-10-CM

## 2014-09-15 DIAGNOSIS — M549 Dorsalgia, unspecified: Secondary | ICD-10-CM

## 2014-09-15 DIAGNOSIS — Z87891 Personal history of nicotine dependence: Secondary | ICD-10-CM

## 2014-09-15 DIAGNOSIS — I10 Essential (primary) hypertension: Secondary | ICD-10-CM

## 2014-09-15 DIAGNOSIS — Z7952 Long term (current) use of systemic steroids: Secondary | ICD-10-CM

## 2014-09-15 DIAGNOSIS — M545 Low back pain: Secondary | ICD-10-CM | POA: Diagnosis present

## 2014-09-15 DIAGNOSIS — Z6841 Body Mass Index (BMI) 40.0 and over, adult: Secondary | ICD-10-CM

## 2014-09-15 DIAGNOSIS — G4700 Insomnia, unspecified: Secondary | ICD-10-CM

## 2014-09-15 DIAGNOSIS — G8929 Other chronic pain: Secondary | ICD-10-CM

## 2014-09-15 DIAGNOSIS — F329 Major depressive disorder, single episode, unspecified: Secondary | ICD-10-CM | POA: Diagnosis present

## 2014-09-15 DIAGNOSIS — F17210 Nicotine dependence, cigarettes, uncomplicated: Secondary | ICD-10-CM

## 2014-09-15 DIAGNOSIS — R0602 Shortness of breath: Secondary | ICD-10-CM

## 2014-09-15 DIAGNOSIS — J302 Other seasonal allergic rhinitis: Secondary | ICD-10-CM | POA: Diagnosis present

## 2014-09-15 DIAGNOSIS — J45901 Unspecified asthma with (acute) exacerbation: Secondary | ICD-10-CM

## 2014-09-15 DIAGNOSIS — F330 Major depressive disorder, recurrent, mild: Secondary | ICD-10-CM

## 2014-09-15 DIAGNOSIS — J449 Chronic obstructive pulmonary disease, unspecified: Secondary | ICD-10-CM | POA: Diagnosis present

## 2014-09-15 DIAGNOSIS — F419 Anxiety disorder, unspecified: Secondary | ICD-10-CM

## 2014-09-15 DIAGNOSIS — Z79899 Other long term (current) drug therapy: Secondary | ICD-10-CM

## 2014-09-15 DIAGNOSIS — Z7951 Long term (current) use of inhaled steroids: Secondary | ICD-10-CM

## 2014-09-15 LAB — CBC WITH DIFFERENTIAL/PLATELET
BASOS ABS: 0 10*3/uL (ref 0.0–0.1)
Basophils Relative: 0 % (ref 0–1)
Eosinophils Absolute: 0.1 10*3/uL (ref 0.0–0.7)
Eosinophils Relative: 1 % (ref 0–5)
HEMATOCRIT: 39.6 % (ref 36.0–46.0)
Hemoglobin: 12.5 g/dL (ref 12.0–15.0)
LYMPHS PCT: 20 % (ref 12–46)
Lymphs Abs: 4 10*3/uL (ref 0.7–4.0)
MCH: 27.5 pg (ref 26.0–34.0)
MCHC: 31.6 g/dL (ref 30.0–36.0)
MCV: 87.2 fL (ref 78.0–100.0)
MONOS PCT: 9 % (ref 3–12)
Monocytes Absolute: 1.7 10*3/uL — ABNORMAL HIGH (ref 0.1–1.0)
NEUTROS ABS: 13.6 10*3/uL — AB (ref 1.7–7.7)
Neutrophils Relative %: 70 % (ref 43–77)
Platelets: 328 10*3/uL (ref 150–400)
RBC: 4.54 MIL/uL (ref 3.87–5.11)
RDW: 17.1 % — AB (ref 11.5–15.5)
WBC: 19.4 10*3/uL — ABNORMAL HIGH (ref 4.0–10.5)

## 2014-09-15 LAB — COMPREHENSIVE METABOLIC PANEL
ALBUMIN: 3 g/dL — AB (ref 3.5–5.0)
ALT: 27 U/L (ref 14–54)
AST: 21 U/L (ref 15–41)
Alkaline Phosphatase: 66 U/L (ref 38–126)
Anion gap: 11 (ref 5–15)
BILIRUBIN TOTAL: 0.4 mg/dL (ref 0.3–1.2)
BUN: 18 mg/dL (ref 6–20)
CO2: 28 mmol/L (ref 22–32)
Calcium: 8.7 mg/dL — ABNORMAL LOW (ref 8.9–10.3)
Chloride: 101 mmol/L (ref 101–111)
Creatinine, Ser: 0.92 mg/dL (ref 0.44–1.00)
GFR calc Af Amer: >60 mL/min (ref >60)
GFR calc non Af Amer: >60 mL/min (ref >60)
GLUCOSE: 120 mg/dL — AB (ref 65–99)
Potassium: 3.4 mmol/L — ABNORMAL LOW (ref 3.5–5.1)
SODIUM: 140 mmol/L (ref 135–145)
TOTAL PROTEIN: 6.2 g/dL — AB (ref 6.5–8.1)

## 2014-09-15 LAB — RAPID URINE DRUG SCREEN, HOSP PERFORMED
Amphetamines: NOT DETECTED
BARBITURATES: NOT DETECTED
BENZODIAZEPINES: NOT DETECTED
COCAINE: NOT DETECTED
Opiates: NOT DETECTED
Tetrahydrocannabinol: NOT DETECTED

## 2014-09-15 LAB — MAGNESIUM: Magnesium: 2.4 mg/dL (ref 1.7–2.4)

## 2014-09-15 MED ORDER — SODIUM CHLORIDE 0.9 % IJ SOLN
3.0000 mL | Freq: Two times a day (BID) | INTRAMUSCULAR | Status: DC
  Administered 2014-09-15 – 2014-09-16 (×3): 3 mL via INTRAVENOUS

## 2014-09-15 MED ORDER — TRAMADOL HCL 50 MG PO TABS
50.0000 mg | ORAL_TABLET | Freq: Four times a day (QID) | ORAL | Status: DC | PRN
  Administered 2014-09-15 – 2014-09-16 (×2): 100 mg via ORAL
  Filled 2014-09-15 (×2): qty 2
  Filled 2014-09-15: qty 1
  Filled 2014-09-15: qty 2

## 2014-09-15 MED ORDER — PANTOPRAZOLE SODIUM 40 MG PO TBEC
40.0000 mg | DELAYED_RELEASE_TABLET | Freq: Every day | ORAL | Status: DC
  Administered 2014-09-15 – 2014-09-16 (×2): 40 mg via ORAL
  Filled 2014-09-15 (×2): qty 1

## 2014-09-15 MED ORDER — LORATADINE 10 MG PO TABS
10.0000 mg | ORAL_TABLET | Freq: Every day | ORAL | Status: DC
  Administered 2014-09-15 – 2014-09-16 (×2): 10 mg via ORAL
  Filled 2014-09-15 (×2): qty 1

## 2014-09-15 MED ORDER — MAGNESIUM SULFATE 2 GM/50ML IV SOLN
2.0000 g | Freq: Once | INTRAVENOUS | Status: AC
  Administered 2014-09-15: 2 g via INTRAVENOUS
  Filled 2014-09-15: qty 50

## 2014-09-15 MED ORDER — IPRATROPIUM-ALBUTEROL 0.5-2.5 (3) MG/3ML IN SOLN
3.0000 mL | RESPIRATORY_TRACT | Status: DC
  Administered 2014-09-15 – 2014-09-16 (×7): 3 mL via RESPIRATORY_TRACT
  Filled 2014-09-15 (×6): qty 3

## 2014-09-15 MED ORDER — ALBUTEROL SULFATE (2.5 MG/3ML) 0.083% IN NEBU: 2.5000 mg | INHALATION_SOLUTION | RESPIRATORY_TRACT | Status: DC | PRN

## 2014-09-15 MED ORDER — TRAMADOL HCL 50 MG PO TABS: 50.0000 mg | ORAL_TABLET | Freq: Four times a day (QID) | ORAL | Status: DC | PRN

## 2014-09-15 MED ORDER — TRAMADOL HCL 50 MG PO TABS
50.0000 mg | ORAL_TABLET | Freq: Four times a day (QID) | ORAL | Status: DC | PRN
  Administered 2014-09-15: 50 mg via ORAL

## 2014-09-15 MED ORDER — ALBUTEROL SULFATE HFA 108 (90 BASE) MCG/ACT IN AERS: 2.0000 | INHALATION_SPRAY | RESPIRATORY_TRACT | Status: DC | PRN

## 2014-09-15 MED ORDER — METHYLPREDNISOLONE SODIUM SUCC 125 MG IJ SOLR
125.0000 mg | Freq: Once | INTRAMUSCULAR | Status: AC
  Administered 2014-09-15: 125 mg via INTRAVENOUS
  Filled 2014-09-15: qty 2

## 2014-09-15 MED ORDER — ENOXAPARIN SODIUM 40 MG/0.4ML ~~LOC~~ SOLN
40.0000 mg | SUBCUTANEOUS | Status: DC
  Administered 2014-09-15: 40 mg via SUBCUTANEOUS
  Filled 2014-09-15 (×2): qty 0.4

## 2014-09-15 MED ORDER — MOMETASONE FURO-FORMOTEROL FUM 200-5 MCG/ACT IN AERO
2.0000 | INHALATION_SPRAY | Freq: Two times a day (BID) | RESPIRATORY_TRACT | Status: DC
  Administered 2014-09-15 – 2014-09-16 (×2): 2 via RESPIRATORY_TRACT
  Filled 2014-09-15: qty 8.8

## 2014-09-15 MED ORDER — NICOTINE 7 MG/24HR TD PT24
7.0000 mg | MEDICATED_PATCH | Freq: Every day | TRANSDERMAL | Status: DC
  Administered 2014-09-15 – 2014-09-16 (×2): 7 mg via TRANSDERMAL
  Filled 2014-09-15 (×2): qty 1

## 2014-09-15 MED ORDER — ZOLPIDEM TARTRATE 5 MG PO TABS: 5.0000 mg | ORAL_TABLET | Freq: Every evening | ORAL | Status: DC | PRN

## 2014-09-15 MED ORDER — HYDROCHLOROTHIAZIDE 12.5 MG PO CAPS
12.5000 mg | ORAL_CAPSULE | Freq: Every day | ORAL | Status: DC
  Administered 2014-09-16: 12.5 mg via ORAL
  Filled 2014-09-15: qty 1

## 2014-09-15 MED ORDER — ZOLPIDEM TARTRATE 10 MG PO TABS: 5.0000 mg | ORAL_TABLET | Freq: Every evening | ORAL | Status: DC | PRN

## 2014-09-15 MED ORDER — ACETAMINOPHEN 325 MG PO TABS: 650.0000 mg | ORAL_TABLET | Freq: Four times a day (QID) | ORAL | Status: DC | PRN

## 2014-09-15 NOTE — Progress Notes (Signed)
Pt set up with nasal Cpap 5cmh20. Pt is tolerating well at this time

## 2014-09-15 NOTE — Assessment & Plan Note (Signed)
Pt would benefit from restart of wellbutrin once discharged or inpt. Given help with anxiety and smoking cessation. This was discussed with patient.

## 2014-09-15 NOTE — Assessment & Plan Note (Signed)
Pt continues to not improve despite frequent nebulizer usage (up to TID) and high doses of prednisone. Pt has very poorly controlled asthma and continues to be symptomatic in clinic today.  -decision for admission was discussed with the patient who agrees for treatment

## 2014-09-15 NOTE — Progress Notes (Signed)
Subjective:   Patient ID: April [name] female   DOB: 06/05/1970 44 y.o.   MRN: [id number]  HPI: April [name] is a 44 y.o. woman with a past medical history as listed below who presents for hospital follow-up.  Patient was discharged on 09/09/14 for asthma exacerbation. As that time the patient has finished her prednisone taper and reports compliance with her multiple inhalers. Since that time she has felt her breathing has not improved. She continues to have auditory wheezing, significant shortness of breath, daily night awakenings and ongoing nonproductive cough. The patient has been using her albuterol nebulizers anywhere between 3 times a day with little to no relief. She continues to slowly titrate her prednisone and is currently on 40 mg and is just frustrated with weight gain but feels it has not improved her breathing. She denied any chest pain contacts or new environmental exposures. She has quit smoking as result of her dyspnea since hospitalization.  Her other concern is ongoing anxiety and depression. She feels that she has depressed mood, is unmotivated, and jittery at times. She was previously treated with an antidepressant but does not know which one which seems to help. She is interested in starting some this time. She denies any suicidal or homicidal ideation. She does live alone and has a poor support group. She is open but reluctant to enter into therapy but has no previous experience.    Past Medical History  Diagnosis Date  . Acanthosis nigricans   . Menorrhagia   . Hypertension, essential   . Insomnia   . Morbid obesity   . Helicobacter pylori (H. pylori) infection   . Sleep apnea     Sleep study 2008 - mild OSA, not enough events to titrate CPAP  . COPD (chronic obstructive pulmonary disease)     PFTs in 2002, FEV1/FVC 65, no post bronchodilater test done  . Asthma     Followed by Dr. Annamaria Boots (pulmonology); receives every other week omalizumab injections; has frequent  exacerbations  . Depression   . GERD (gastroesophageal reflux disease)   . Tobacco user   . Obesity   . Shortness of breath   . Headache(784.0)   . Seasonal allergies    Current Outpatient Prescriptions  Medication Sig Dispense Refill  . albuterol (PROAIR HFA) 108 (90 BASE) MCG/ACT inhaler Inhale 2 puffs into the lungs every 4 (four) hours as needed for wheezing or shortness of breath. 8.5 g 2  . albuterol (PROVENTIL HFA;VENTOLIN HFA) 108 (90 BASE) MCG/ACT inhaler Inhale 1-2 puffs into the lungs every 4 (four) hours as needed for wheezing or shortness of breath. 18 g 5  . albuterol (PROVENTIL) (2.5 MG/3ML) 0.083% nebulizer solution Take 3 mLs (2.5 mg total) by nebulization every 4 (four) hours as needed for wheezing or shortness of breath. 30 vial 11  . cyclobenzaprine (FLEXERIL) 5 MG tablet Take 1 tablet (5 mg total) by mouth 3 (three) times daily as needed for muscle spasms. (Patient not taking: Reported on 08/22/2014) 30 tablet 0  . hydrochlorothiazide (MICROZIDE) 12.5 MG capsule Take 1 capsule (12.5 mg total) by mouth daily. 30 capsule 6  . ibuprofen (MOTRIN IB) 200 MG tablet Take 4 tablets (800 mg total) by mouth every 8 (eight) hours as needed for moderate pain. 30 tablet 1  . meloxicam (MOBIC) 7.5 MG tablet Take 1 tablet (7.5 mg total) by mouth daily. (Patient not taking: Reported on 08/22/2014) 12 tablet 0  . mometasone-formoterol (DULERA) 200-5 MCG/ACT AERO Inhale 2  puffs into the lungs 2 (two) times daily. Rinse mouth after inhalation 1 Inhaler prn  . omalizumab (XOLAIR) 150 MG injection Inject 150 mg into the skin every 14 (fourteen) days.    [name] omeprazole (PRILOSEC) 20 MG capsule Take 20 mg by mouth 2 (two) times daily before a meal.    . predniSONE (DELTASONE) 10 MG tablet Take 1 tablet (10 mg total) by mouth 2 (two) times daily with a meal. 50 tablet 0  . predniSONE (DELTASONE) 10 MG tablet Take 50 mg daily for 5 days, then 40 mg daily for 5 days, then 30 mg daily for 5 days, then  resume 10 mg twice daily thereafter. 60 tablet 0  . traMADol (ULTRAM) 50 MG tablet 1-2 tabs po q 6 hr prn pain Maximum dose= 8 tablets per day (Patient taking differently: Take 50 mg by mouth every 6 (six) hours as needed for moderate pain or severe pain. ) 30 tablet 0  . zolpidem (AMBIEN) 10 MG tablet Take 10 mg by mouth at bedtime as needed for sleep.     No current facility-administered medications for this visit.   Facility-Administered Medications Ordered in Other Visits  Medication Dose Route Frequency Provider Last Rate Last Dose  . traMADol (ULTRAM) tablet 50 mg  50 mg Oral Q6H PRN [name], MD      . zolpidem [facility]) tablet 10 mg  10 mg Oral QHS PRN [name], MD       Family History  Problem Relation Age of Onset  . Asthma Daughter   . Cancer Paternal Aunt   . Asthma Maternal Grandmother   . Hypertension Mother    History   Social History  . Marital Status: Single    Spouse Name: N/A  . Number of Children: 1  . 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 Smoking status: Former Smoker -- 18 years    Types: Cigarettes  . Smokeless tobacco: Never Used  . Alcohol Use: No  . Drug Use: No  . Sexual Activity: Not on file   Other Topics Concern  . 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    Review of Systems: Pertinent items are noted in HPI. Objective:  Physical Exam: Filed Vitals:   09/15/14 1026  BP: 179/108  Pulse: 85  Temp: 98.1 F (36.7 C)  TempSrc: Oral  Height: 5\' 6"  (1.676 m)  Weight: 336 lb 12.8 oz (152.771 kg)  SpO2: 100%   General: dyspneic, having trouble completing sentences  HEENT: PERRL, EOMI, no scleral icterus Cardiac: RRR, no rubs, murmurs or gallops Pulm: significant wheezing in lower lung fields and throughout, no crackles or rhonchi, no  dullness to percussion or increased egophany Abd: soft, nontender, nondistended, BS present Ext: warm and well perfused, no pedal edema Neuro: alert and oriented X3, cranial nerves II-XII grossly intact  Assessment & Plan:  Please see problem oriented charting  Pt discussed with Dr. Daryll Drown

## 2014-09-15 NOTE — H&P (Signed)
Date: 09/15/2014               Patient Name:  April [name] MRN: [id number]  DOB: 02-Dec-1970 Age / Sex: 44 y.o., female   PCP: [name], MD              Medical Service: Internal Medicine Teaching Service              Attending Physician: Dr. Madilyn Fireman, MD    First Contact: Reynaldo Minium, MS4 Pager: [phone]  Second Contact: Dr. Ronnald Ramp Pager: [phone]            After Hours (After 5p/  First Contact Pager: [phone]  weekends / holidays): Second Contact Pager: [phone]   Chief Complaint:  Shortness of breath.  History of Present Illness: April [name] is a 44 year old woman with history of hypertension, asthma, OSA,and morbid obesity presenting with shortness of breath. The patient was recently discharged from the hospital on 09/09/2014 after being treated for an asthma exacerbation.during the hospitalization, she was started on prednisone 60 mg daily with plans to taper to her home dose of 10 mg twice a day. She was also given duo nebs and when necessary albuterol nebulizers. She went to [facility] clinic today for hospital follow-up and reported continued dyspnea. Per the patient, her shortness of breath improved during the hospitalization but never completely resolved. She reports having ongoing anxiety due to stresses in her life related to her job that may also be contributing to her shortness of breath. She is continue to smoke 3-4 cigarettes per day, and she says that her GERD is poorly controlled. She has been purchasing Prilosec over-the-counter because she cannot afford prescription Prilosec. She also reports congestion and rhinorrhea due to her allergies. She also has a nonproductive cough. She denies fevers, chills, chest pain, nausea, or vomiting.  Review of Systems: Review of Systems  Constitutional: Negative for fever, chills and malaise/fatigue.  HENT: Positive for congestion. Negative for sore throat.   Eyes: Negative for blurred vision and photophobia.  Respiratory:  Positive for cough, shortness of breath and wheezing. Negative for sputum production.   Cardiovascular: Negative for chest pain, orthopnea and leg swelling.  Gastrointestinal: Positive for heartburn. Negative for nausea, vomiting, abdominal pain, diarrhea and constipation.  Genitourinary: Negative for dysuria and frequency.  Musculoskeletal: Positive for myalgias and back pain (chronic). Negative for falls.  Skin: Negative for rash.  Neurological: Negative for dizziness, sensory change, focal weakness and headaches.    Meds: No prescriptions prior to admission   Current Facility-Administered Medications  Medication Dose Route Frequency Provider Last Rate Last Dose  . traMADol (ULTRAM) tablet 50 mg  50 mg Oral Q6H PRN [name], MD      . zolpidem (AMBIEN) tablet 5 mg  5 mg Oral QHS PRN Madilyn Fireman, MD       Current Outpatient Prescriptions  Medication Sig Dispense Refill  . albuterol (PROAIR HFA) 108 (90 BASE) MCG/ACT inhaler Inhale 2 puffs into the lungs every 4 (four) hours as needed for wheezing or shortness of breath. 8.5 g 2  . albuterol (PROVENTIL HFA;VENTOLIN HFA) 108 (90 BASE) MCG/ACT inhaler Inhale 1-2 puffs into the lungs every 4 (four) hours as needed for wheezing or shortness of breath. 18 g 5  . albuterol (PROVENTIL) (2.5 MG/3ML) 0.083% nebulizer solution Take 3 mLs (2.5 mg total) by nebulization every 4 (four) hours as needed for wheezing or shortness of breath. 30 vial 11  . cyclobenzaprine (FLEXERIL) 5 MG  tablet Take 1 tablet (5 mg total) by mouth 3 (three) times daily as needed for muscle spasms. (Patient not taking: Reported on 08/22/2014) 30 tablet 0  . hydrochlorothiazide (MICROZIDE) 12.5 MG capsule Take 1 capsule (12.5 mg total) by mouth daily. 30 capsule 6  . ibuprofen (MOTRIN IB) 200 MG tablet Take 4 tablets (800 mg total) by mouth every 8 (eight) hours as needed for moderate pain. 30 tablet 1  . meloxicam (MOBIC) 7.5 MG tablet Take 1 tablet (7.5 mg total) by  mouth daily. (Patient not taking: Reported on 08/22/2014) 12 tablet 0  . mometasone-formoterol (DULERA) 200-5 MCG/ACT AERO Inhale 2 puffs into the lungs 2 (two) times daily. Rinse mouth after inhalation 1 Inhaler prn  . omalizumab (XOLAIR) 150 MG injection Inject 150 mg into the skin every 14 (fourteen) days.    [name] omeprazole (PRILOSEC) 20 MG capsule Take 20 mg by mouth 2 (two) times daily before a meal.    . predniSONE (DELTASONE) 10 MG tablet Take 1 tablet (10 mg total) by mouth 2 (two) times daily with a meal. 50 tablet 0  . predniSONE (DELTASONE) 10 MG tablet Take 50 mg daily for 5 days, then 40 mg daily for 5 days, then 30 mg daily for 5 days, then resume 10 mg twice daily thereafter. 60 tablet 0  . traMADol (ULTRAM) 50 MG tablet 1-2 tabs po q 6 hr prn pain Maximum dose= 8 tablets per day (Patient taking differently: Take 50 mg by mouth every 6 (six) hours as needed for moderate pain or severe pain. ) 30 tablet 0  . zolpidem (AMBIEN) 10 MG tablet Take 10 mg by mouth at bedtime as needed for sleep.      Allergies: Allergies as of 09/15/2014  . (No Known Allergies)   Past Medical History  Diagnosis Date  . Acanthosis nigricans   . Menorrhagia   . Hypertension, essential   . Insomnia   . Morbid obesity   . Helicobacter pylori (H. pylori) infection   . Sleep apnea     Sleep study 2008 - mild OSA, not enough events to titrate CPAP  . COPD (chronic obstructive pulmonary disease)     PFTs in 2002, FEV1/FVC 65, no post bronchodilater test done  . Asthma     Followed by Dr. Annamaria Boots (pulmonology); receives every other week omalizumab injections; has frequent exacerbations  . Depression   . GERD (gastroesophageal reflux disease)   . Tobacco user   . Obesity   . Shortness of breath   . Headache(784.0)   . Seasonal allergies    Past Surgical History  Procedure Laterality Date  . Tubal ligation  1996    bilateral  . Breast reduction surgery  09/2011   Family History  Problem Relation  Age of Onset  . Asthma Daughter   . Cancer Paternal Aunt   . Asthma Maternal Grandmother   . Hypertension Mother    History   Social History  . Marital Status: Single    Spouse Name: N/A  . Number of Children: 1  . 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 Smoking status: Former Smoker -- 18 years    Types: Cigarettes  . Smokeless tobacco: Never Used  . Alcohol Use: No  . Drug Use: No  . Sexual Activity: Not on file   Other Topics Concern  . 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     Physical Exam: There were no vitals filed for this visit. Physical Exam  Constitutional: She is oriented to person, place, and time. No distress.  Obese.  HENT:  Head: Normocephalic and atraumatic.  Mouth/Throat: No oropharyngeal exudate.  Eyes: Conjunctivae and EOM are normal. Pupils are equal, round, and reactive to light. No scleral icterus.  Neck: Normal range of motion. Neck supple. No thyromegaly present.  Cardiovascular: Normal rate, regular rhythm and normal heart sounds.   Pulmonary/Chest: She is in respiratory distress (mild, speaking in complete sentences).  Good air movement, end-expiratory wheezing bilaterally.  Abdominal: Soft. Bowel sounds are normal. She exhibits no distension. There is no tenderness.  Musculoskeletal: Normal range of motion. She exhibits tenderness (palpation of upper and mid back centrally and laterally). She exhibits no edema.  Neurological: She is alert and oriented to person, place, and time. No cranial nerve deficit. She exhibits normal muscle tone.  Skin: Skin is warm and dry. No rash noted. She is not diaphoretic. No erythema.    Lab and imaging results: None.  Assessment & Plan by Problem: Principal Problem:   Asthma exacerbation Active  Problems:   Morbid obesity with body mass index of 50.0-59.9 in adult   History of tobacco abuse   Essential hypertension   Esophageal reflux   Obstructive sleep apnea   Back pain of thoracolumbar region   Seasonal allergic rhinitis   Morbid obesity   #Acute asthma exacerbation The patient has severe asthma on chronic prednisone and Xolair. She reports that she is scheduled to receive her next Xolair injection tomorrow, but this cannot be given as an inpatient. She has multiple triggers for her asthma currently that could be better controlled, including her GERD, allergies, and continued smoking.  Her underlying anxiety may be contributing to her shortness of breath as well.  Will give IV steroids because she did not respond to oral prednisone during the last admission.  She reports her baseline peak flow is around 400 at home. No signs of infection currently. -Admit to telemetry. -Duo nebs every 3 hours. -Albuterol nebulizers every 4 hours as needed. -Solu-Medrol 125 mg IV once. -2 g magnesium sulfate IV once. -Start loratadine 10 mg daily. -Continue home Dulera 2 puffs twice a day. -Hold home NSAIDs due to concern for asthma trigger. -Monitor peak expiratory flow. -Check CMP, magnesium, CBC. -Check urine drug screen. -Check chest x-ray PA and lateral. -Check EKG. -Heart healthy diet. -Improved control of GERD as below. -Encouraged smoking cessation as below. -Patient will need appointment for Xolair injection rescheduled upon discharge if she cannot make it to the appointment tomorrow.  #Hypertension Found to be hypertensive on admission. -Continue home hydrocodone thiazide 12.5 mg daily.  #GERD Poorly controlled on Prilosec OTC, but she can't afford her prescription for Prilosec. -Protonix 40 mg daily by mouth. -Consult care management for medication needs.  #Chronic back pain Reports chronic back pain, likely due to obesity.  She previously had breast reduction surgery  without improvement. Thoracic films negative in January 2015. Cervical spine spurs noted in 2007 with negative lumbar spine radiographs. She reports that Percocet has worked for the pain in the past, and she has minimal relief with tramadol. However, I would try to avoid long-term opioids in her. She would likely benefit from physical therapy if possible. -Tramadol 50-100 mg every 6 hours when necessary. -  Tylenol 650 mg every 6 hours as needed. -Consider referral to physical therapy as an outpatient. -Encouraged weight loss. -Heating pad when necessary. -Hold home Flexeril as she says this is not helping her. -Hold home NSAIDs as above.  #Tobacco abuse Likely contributing to ongoing asthma. Currently, she reports stress in her life is preventing her from quitting. -Encouraged smoking cessation. -Nicoderm 7 mg patch daily.  #Anxiety/insomnia She is not currently on any treatment for anxiety. This may be contributing somewhat her shortness of breath. -Consider starting an SSRI. -Continue home Ambien 5 mg daily.  #DVT prophylaxis -Lovenox and SCDs.  Dispo: Disposition is deferred at this time, awaiting improvement of current medical problems. Anticipated discharge in approximately 1-3 day(s).   The patient does have a current PCP (Tasrif Ahmed, MD), therefore will be require OPC follow-up after discharge.   The patient does not have transportation limitations that hinder transportation to clinic appointments.   Signed:  Arman Filter, MD, PhD PGY-1 Internal Medicine Teaching Service Pager: [phone] 09/15/2014, 1:20 PM

## 2014-09-16 ENCOUNTER — Ambulatory Visit: Payer: Self-pay

## 2014-09-16 DIAGNOSIS — Z87891 Personal history of nicotine dependence: Secondary | ICD-10-CM

## 2014-09-16 DIAGNOSIS — J45901 Unspecified asthma with (acute) exacerbation: Secondary | ICD-10-CM

## 2014-09-16 MED ORDER — SERTRALINE HCL 50 MG PO TABS: 50.0000 mg | ORAL_TABLET | Freq: Every day | ORAL | Status: DC

## 2014-09-16 MED ORDER — IPRATROPIUM BROMIDE 0.02 % IN SOLN: 0.5000 mg | Freq: Four times a day (QID) | RESPIRATORY_TRACT | Status: DC

## 2014-09-16 MED ORDER — SERTRALINE HCL 50 MG PO TABS
50.0000 mg | ORAL_TABLET | Freq: Every day | ORAL | Status: DC
Start: 2014-09-16 — End: 2014-09-16
  Administered 2014-09-16: 50 mg via ORAL
  Filled 2014-09-16: qty 1

## 2014-09-16 MED ORDER — LORATADINE 10 MG PO TABS: 10.0000 mg | ORAL_TABLET | Freq: Every day | ORAL | Status: DC

## 2014-09-16 MED ORDER — PREDNISONE 20 MG PO TABS
40.0000 mg | ORAL_TABLET | Freq: Every day | ORAL | Status: DC
  Administered 2014-09-16: 40 mg via ORAL
  Filled 2014-09-16 (×2): qty 2

## 2014-09-16 MED ORDER — ZOLPIDEM TARTRATE 5 MG PO TABS
5.0000 mg | ORAL_TABLET | Freq: Every evening | ORAL | Status: DC | PRN
  Administered 2014-09-16: 5 mg via ORAL
  Filled 2014-09-16: qty 1

## 2014-09-16 NOTE — Progress Notes (Signed)
Internal Medicine Clinic Attending  Case discussed with Dr. Sadek at the time of the visit.  We reviewed the resident's history and exam and pertinent patient test results.  I agree with the assessment, diagnosis, and plan of care documented in the resident's note.  

## 2014-09-16 NOTE — Progress Notes (Signed)
April [name] to be D/C'd Home per MD order.  Discussed with the patient and all questions fully answered.  VSS, Skin clean, dry and intact without evidence of skin break down, no evidence of skin tears noted. IV catheter discontinued intact. Site without signs and symptoms of complications. Dressing and pressure applied.  An After Visit Summary was printed and given to the patient. Patient received prescription.  D/c education completed with patient/family including follow up instructions, medication list, d/c activities limitations if indicated, with other d/c instructions as indicated by MD - patient able to verbalize understanding, all questions fully answered.   Patient instructed to return to ED, call 911, or call MD for any changes in condition.   Patient escorted via [place], and D/C home via private auto.  L'ESPERANCE, Lasandra Batley C 09/16/2014 1:42 PM

## 2014-09-16 NOTE — Discharge Summary (Signed)
Name: April [name] MRN: [id number] DOB: 1970/10/07 44 y.o. PCP: [name], MD  Date of Admission: 09/15/2014  1:22 PM Date of Discharge: 09/16/2014 Attending Physician: No att. providers found  Discharge Diagnosis:  Principal Problem:   Asthma exacerbation Active Problems:   Morbid obesity with body mass index of 50.0-59.9 in adult   History of tobacco abuse   Essential hypertension   Esophageal reflux   Obstructive sleep apnea   Back pain of thoracolumbar region   Seasonal allergic rhinitis   Morbid obesity  Discharge Medications:   Medication List    STOP taking these medications        omeprazole 20 MG capsule  Commonly known as:  PRILOSEC     oxyCODONE-acetaminophen 5-325 MG per tablet  Commonly known as:  PERCOCET/ROXICET      TAKE these medications        albuterol (2.5 MG/3ML) 0.083% nebulizer solution  Commonly known as:  PROVENTIL  Take 3 mLs (2.5 mg total) by nebulization every 4 (four) hours as needed for wheezing or shortness of breath.     albuterol 108 (90 BASE) MCG/ACT inhaler  Commonly known as:  PROVENTIL HFA;VENTOLIN HFA  Inhale 1-2 puffs into the lungs every 4 (four) hours as needed for wheezing or shortness of breath.     hydrochlorothiazide 12.5 MG capsule  Commonly known as:  MICROZIDE  Take 1 capsule (12.5 mg total) by mouth daily.     ibuprofen 200 MG tablet  Commonly known as:  MOTRIN IB  Take 4 tablets (800 mg total) by mouth every 8 (eight) hours as needed for moderate pain.     ipratropium 0.02 % nebulizer solution  Commonly known as:  ATROVENT  Take 2.5 mLs (0.5 mg total) by nebulization 4 (four) times daily.     loratadine 10 MG tablet  Commonly known as:  CLARITIN  Take 1 tablet (10 mg total) by mouth daily.     mometasone-formoterol 200-5 MCG/ACT Aero  Commonly known as:  DULERA  Inhale 2 puffs into the lungs 2 (two) times daily. Rinse mouth after inhalation     omalizumab 150 MG injection  Commonly known as:  XOLAIR   Inject 150 mg into the skin every 14 (fourteen) days.     pantoprazole 20 MG tablet  Commonly known as:  PROTONIX  Take 20 mg by mouth daily.     predniSONE 10 MG tablet  Commonly known as:  DELTASONE  Take 1 tablet (10 mg total) by mouth 2 (two) times daily with a meal.     sertraline 50 MG tablet  Commonly known as:  ZOLOFT  Take 1 tablet (50 mg total) by mouth daily.     traMADol 50 MG tablet  Commonly known as:  ULTRAM  1-2 tabs po q 6 hr prn pain Maximum dose= 8 tablets per day     zolpidem 10 MG tablet  Commonly known as:  AMBIEN  Take 10 mg by mouth at bedtime as needed for sleep.        Disposition and follow-up:   Ms.Jull TIYE HUWE was discharged from [facility] in Good condition.  At the hospital follow up visit please address:  1.  Asthma; is patient actively wheezing? Has she had access to her inhaled medications? Was she able to obtain her Xolair injection? Has she been taking her Prednisone taper as previous directed?  2.  Labs / imaging needed at time of follow-up: None  3.  Pending labs/ test needing follow-up: None  Follow-up Appointments:     Follow-up Information    Follow up with Clinton Gallant, MD On 09/23/2014.   Specialty:  Internal Medicine   Why:  10:15 AM   Contact information:   [place] [address] [phone]       Follow up with Deneise Lever, MD On 09/20/2014.   Specialty:  Pulmonary Disease   Why:  10:15 AM   Contact information:   [place] [address] [phone]       Procedures Performed:  X-ray Chest Pa And Lateral  09/15/2014   CLINICAL DATA:  Shortness of breath for several days, asthma  EXAM: CHEST  2 VIEW  COMPARISON:  Chest x-ray of 09/08/2014  FINDINGS: No active infiltrate or effusion is seen. Mild peribronchial thickening is noted which may indicate bronchitis. Mediastinal and hilar contours are unremarkable. The heart is within normal limits in size. No bony abnormality  is seen.  IMPRESSION: No pneumonia or effusion. Peribronchial thickening may indicate bronchitis.   Electronically Signed   By: Ivar Drape M.D.   On: 09/15/2014 16:14   Dg Chest 2 View  09/08/2014   CLINICAL DATA:  Asthma.  Dyspnea and wheezing  EXAM: CHEST  2 VIEW  COMPARISON:  08/25/2014  FINDINGS: The heart size and mediastinal contours are within normal limits. Both lungs are clear. The visualized skeletal structures are unremarkable.  IMPRESSION: No active cardiopulmonary disease.   Electronically Signed   By: Franchot Gallo M.D.   On: 09/08/2014 13:09   Dg Chest 2 View (if Patient Has Fever And/or Copd)  08/25/2014   CLINICAL DATA:  Shortness of breath and cough.  EXAM: CHEST  2 VIEW  COMPARISON:  PA and lateral chest 06/06/2014 03/27/2014.  FINDINGS: The lungs are clear. Heart size is normal. No pneumothorax or pleural effusion is identified. No focal bony abnormality is seen.  IMPRESSION: Negative chest.   Electronically Signed   By: Inge Rise M.D.   On: 08/25/2014 16:18     Admission HPI: April [name] is a 44 y/o F w/ PMHx of HTN, Asthma, OSA, and morbid obesity who presented from clinic on 09/15/2014 for congestion and SOB. She was recently admitted 09/08/2014-09/09/2014 for asthma exacerbation and was continuing her prednisone drip from 60 mg daily to her previous home dose of 10 mg BID. At the time of admission, she was on day 2 (of 5) at 40 mg prednisone daily. Patient has a known h/o of moderate/severe persistent asthma, follows w/ Dr. Annamaria Boots. She stated she has 2-3 exacerbations per year, has never required intubation. The patient claimed she typically has to use her rescue inhaler at least 1-2 times daily and frequently wakes up from sleep w/ wheezing and SOB when she needs to use her inhaler. She had been taking Prednisone 10 mg bid, started by her pulmonologist but says she still has severe asthma symptoms and thinks this is due to seasonal allergies. She typically had exacerbations in  the summer and spring related to this. She was currently smoking, 3-4 cigarettes daily. She also felt that her anxiety worsens or triggers her asthma exacerbations. She reported that her GERD was poorly controlled. She had been purchasing Prilosec over-the-counter because she could not afford prescription Prilosec. She also reported congestion and rhinorrhea due to her allergies. She also endorsed nonproductive cough. She denied fevers, chills, chest pain, nausea, or vomiting. Patient was last admitted in 02/2014 for similar issues  and again in 10/2013 at which she left AMA.   The patient received 125 mg IV Solu-Medrol and was continued on her existing prednisone taper and received DuoNeb treatments as well as her home medications. She was discharged to home on 09/16/2014. On the day of discharge, the patient was in stable condition and reported improved SOB/DOE. She continued to deny chest pain, fever/chills, dizziness, or palpitations.  No procedures were performed during the course of this hospitalization.   Hospital Course by problem list:   1. Asthma Exacerbation: The patient has known moderate to severe persistent asthma, with 2-3 hospitalizations annually, no previous intubations, albuterol use 3-4 times daily as well as once during sleep. She is currently smoking 3-4 cigarettes daily. Allergies and anxiety also contribute to her exacerbations. The patient received IV Solu-Medrol 125 mg and then was continued on her existing Prednisone taper to home dose (10 mg BID). She received Duonebs and was continued on her home Dulera and PRN Albuterol. She is scheduled to receive Xolair injection today 09/16/2014 as an outpatient. She will follow up with her pulmonologist Dr. Annamaria Boots after discharge for management of her home meds and potentially PFTs.  2. Lower Back Pain, chronic: The patient received her home Tramadol, as well as Tylenol, during this hospitalization. She will follow up with her PCP for  long-term management of her chronic lower back pain.  3. Hypertension: Stable on home HCTZ.  4. GERD: Previously poorly controlled as the patient could not afford her Prilosec prescription. She received Protonix 40 mg daily during this hospitalization.  5. Obstructive Sleep Apnea: the patient slept with CPAP during this hospitalization.  6. Tobacco Abuse: The patient recently restarted smoking, currently approximately 3-4 cigarettes daily. She requested and received nicotine patch 7 mg during this hospitalization. Smoking cessation was emphasized given her frequent asthma exacerbations.  7. Anxiety: The patient has longstanding anxiety, which both likely contributes to her asthma exacerbations and is in turn worsened by frequent prednisone and albuterol use. She was previously on Zoloft but is unsure why she stopped taking it. The patient was given a prescription for Zoloft 50 mg daily; she will follow up with her PCP for long-term management of anxiety.  Discharge Vitals:   BP 163/86 mmHg  Pulse 78  Temp(Src) 98.3 F (36.8 C) (Oral)  Resp 17  Ht 5\' 6"  (1.676 m)  Wt 335 lb 15.7 oz (152.4 kg)  BMI 54.25 kg/m2  SpO2 98%  LMP 07/25/2014  Discharge Labs:  No results found. However, due to the size of the patient record, not all encounters were searched. Please check Results Review for a complete set of results.  Signed: [name], MD 09/16/2014, 6:47 PM    Services Ordered on Discharge: None Equipment Ordered on Discharge: None

## 2014-09-16 NOTE — Discharge Instructions (Signed)
1. You have a follow up appointment as follows:  April [name]  On 09/23/2014 @ 10:15 AM  [place] [address] [phone]  Deneise Lever, MD  On 09/20/2014 @ 10:15 AM  [address]  [place] [address] [phone]  Go today for Xolair injection at Dr. Janee Morn office at 3:00 PM.   2. Please take all medications as previously prescribed with the following changes:  START TAKING ZOLOFT 50 MG DAILY FOR ANXIETY. THIS MAY TAKE UP TO 4-6 WEEKS TO TAKE FULL EFFECT IN CONTROLLING YOUR ANXIETY SYMPTOMS.   Continue taking Prednisone taper; take 40 mg daily for 3 more days (starting tomorrow), then 30 mg daily for 5 days, then resume 10 mg twice daily thereafter.  3. If you have worsening of your symptoms or new symptoms arise, please call the clinic ([phone]), or go to the ER immediately if symptoms are severe.   Asthma Attack Prevention Although there is no way to prevent asthma from starting, you can take steps to control the disease and reduce its symptoms. Learn about your asthma and how to control it. Take an active role to control your asthma by working with your health care provider to create and follow an asthma action plan. An asthma action plan guides you in:  Taking your medicines properly.  Avoiding things that set off your asthma or make your asthma worse (asthma triggers).  Tracking your level of asthma control.  Responding to worsening asthma.  Seeking emergency care when needed. To track your asthma, keep records of your symptoms, check your peak flow number using a handheld device that shows how well air moves out of your lungs (peak flow meter), and get regular asthma checkups.  WHAT ARE SOME WAYS TO PREVENT AN ASTHMA ATTACK?  Take medicines as directed by your health care provider.  Keep track of your asthma symptoms and level of control.  With your health care provider, write a detailed plan for taking medicines and managing an asthma attack.  Then be sure to follow your action plan. Asthma is an ongoing condition that needs regular monitoring and treatment.  Identify and avoid asthma triggers. Many outdoor allergens and irritants (such as pollen, mold, cold air, and air pollution) can trigger asthma attacks. Find out what your asthma triggers are and take steps to avoid them.  Monitor your breathing. Learn to recognize warning signs of an attack, such as coughing, wheezing, or shortness of breath. Your lung function may decrease before you notice any signs or symptoms, so regularly measure and record your peak airflow with a home peak flow meter.  Identify and treat attacks early. If you act quickly, you are less likely to have a severe attack. You will also need less medicine to control your symptoms. When your peak flow measurements decrease and alert you to an upcoming attack, take your medicine as instructed and immediately stop any activity that may have triggered the attack. If your symptoms do not improve, get medical help.  Pay attention to increasing quick-relief inhaler use. If you find yourself relying on your quick-relief inhaler, your asthma is not under control. See your health care provider about adjusting your treatment. WHAT CAN MAKE MY SYMPTOMS WORSE? A number of common things can set off or make your asthma symptoms worse and cause temporary increased inflammation of your airways. Keep track of your asthma symptoms for several weeks, detailing all the environmental and emotional factors that are linked with your asthma. When  you have an asthma attack, go back to your asthma diary to see which factor, or combination of factors, might have contributed to it. Once you know what these factors are, you can take steps to control many of them. If you have allergies and asthma, it is important to take asthma prevention steps at home. Minimizing contact with the substance to which you are allergic will help prevent an asthma attack.  Some triggers and ways to avoid these triggers are: Animal Dander:  Some people are allergic to the flakes of skin or dried saliva from animals with fur or feathers.   There is no such thing as a hypoallergenic dog or cat breed. All dogs or cats can cause allergies, even if they don't shed.  Keep these pets out of your home.  If you are not able to keep a pet outdoors, keep the pet out of your bedroom and other sleeping areas at all times, and keep the door closed.  Remove carpets and furniture covered with cloth from your home. If that is not possible, keep the pet away from fabric-covered furniture and carpets. Dust Mites: Many people with asthma are allergic to dust mites. Dust mites are tiny bugs that are found in every home in mattresses, pillows, carpets, fabric-covered furniture, bedcovers, clothes, stuffed toys, and other fabric-covered items.   Cover your mattress in a special dust-proof cover.  Cover your pillow in a special dust-proof cover, or wash the pillow each week in hot water. Water must be hotter than 130 F (54.4 C) to kill dust mites. Cold or warm water used with detergent and bleach can also be effective.  Wash the sheets and blankets on your bed each week in hot water.  Try not to sleep or lie on cloth-covered cushions.  Call ahead when traveling and ask for a smoke-free hotel room. Bring your own bedding and pillows in case the hotel only supplies feather pillows and down comforters, which may contain dust mites and cause asthma symptoms.  Remove carpets from your bedroom and those laid on concrete, if you can.  Keep stuffed toys out of the bed, or wash the toys weekly in hot water or cooler water with detergent and bleach. Cockroaches: Many people with asthma are allergic to the droppings and remains of cockroaches.   Keep food and garbage in closed containers. Never leave food out.  Use poison baits, traps, powders, gels, or paste (for example, boric  acid).  If a spray is used to kill cockroaches, stay out of the room until the odor goes away. Indoor Mold:  Fix leaky faucets, pipes, or other sources of water that have mold around them.  Clean floors and moldy surfaces with a fungicide or diluted bleach.  Avoid using humidifiers, vaporizers, or swamp coolers. These can spread molds through the air. Pollen and Outdoor Mold:  When pollen or mold spore counts are high, try to keep your windows closed.  Stay indoors with windows closed from late morning to afternoon. Pollen and some mold spore counts are highest at that time.  Ask your health care provider whether you need to take anti-inflammatory medicine or increase your dose of the medicine before your allergy season starts. Other Irritants to Avoid:  Tobacco smoke is an irritant. If you smoke, ask your health care provider how you can quit. Ask family members to quit smoking, too. Do not allow smoking in your home or car.  If possible, do not use a wood-burning stove, kerosene heater,  or fireplace. Minimize exposure to all sources of smoke, including incense, candles, fires, and fireworks.  Try to stay away from strong odors and sprays, such as perfume, talcum powder, hair spray, and paints.  Decrease humidity in your home and use an indoor air cleaning device. Reduce indoor humidity to below 60%. Dehumidifiers or central air conditioners can do this.  Decrease house dust exposure by changing furnace and air cooler filters frequently.  Try to have someone else vacuum for you once or twice a week. Stay out of rooms while they are being vacuumed and for a short while afterward.  If you vacuum, use a dust mask from a hardware store, a double-layered or microfilter vacuum cleaner bag, or a vacuum cleaner with a HEPA filter.  Sulfites in foods and beverages can be irritants. Do not drink beer or wine or eat dried fruit, processed potatoes, or shrimp if they cause asthma  symptoms.  Cold air can trigger an asthma attack. Cover your nose and mouth with a scarf on cold or windy days.  Several health conditions can make asthma more difficult to manage, including a runny nose, sinus infections, reflux disease, psychological stress, and sleep apnea. Work with your health care provider to manage these conditions.  Avoid close contact with people who have a respiratory infection such as a cold or the flu, since your asthma symptoms may get worse if you catch the infection. Wash your hands thoroughly after touching items that may have been handled by people with a respiratory infection.  Get a flu shot every year to protect against the flu virus, which often makes asthma worse for days or weeks. Also get a pneumonia shot if you have not previously had one. Unlike the flu shot, the pneumonia shot does not need to be given yearly. Medicines:  Talk to your health care provider about whether it is safe for you to take aspirin or non-steroidal anti-inflammatory medicines (NSAIDs). In a small number of people with asthma, aspirin and NSAIDs can cause asthma attacks. These medicines must be avoided by people who have known aspirin-sensitive asthma. It is important that people with aspirin-sensitive asthma read labels of all over-the-counter medicines used to treat pain, colds, coughs, and fever.  Beta-blockers and ACE inhibitors are other medicines you should discuss with your health care provider. HOW CAN I FIND OUT WHAT I AM ALLERGIC TO? Ask your asthma health care provider about allergy skin testing or blood testing (the RAST test) to identify the allergens to which you are sensitive. If you are found to have allergies, the most important thing to do is to try to avoid exposure to any allergens that you are sensitive to as much as possible. Other treatments for allergies, such as medicines and allergy shots (immunotherapy) are available.  CAN I EXERCISE? Follow your health care  provider's advice regarding asthma treatment before exercising. It is important to maintain a regular exercise program, but vigorous exercise or exercise in cold, humid, or dry environments can cause asthma attacks, especially for those people who have exercise-induced asthma. Document Released: 04/03/2009 Document Revised: 04/20/2013 Document Reviewed: 10/21/2012 [facility] Patient Information 2015 [place], [place]. This information is not intended to replace advice given to you by your health care provider. Make sure you discuss any questions you have with your health care provider.

## 2014-09-16 NOTE — Progress Notes (Signed)
Oxygen sats were 91% on room air while up walking.

## 2014-09-16 NOTE — Telephone Encounter (Signed)
Infusion form was faxed, Arvid Right will be here next wk.. Nothing further needed.

## 2014-09-16 NOTE — Progress Notes (Signed)
Inpatient Progress Note - Internal Medicine  Subjective: Today the patient is feeling well. She reports cough that is not bringing any sputum up, but she can hear it moving in her chest with cough or deep inspiration. She denies SOB/DOE above her baseline. She denies chest or abdominal pain. She reports anxiety and increased appetite. She slept poorly last night as she usually sleeps during the day and works at night.  Objective: Vital signs in last 24 hours: Filed Vitals:   09/16/14 0411 09/16/14 0500 09/16/14 0545 09/16/14 0758  BP:   163/86   Pulse:   78   Temp:   98.3 F (36.8 C)   TempSrc:   Oral   Resp:   17   Height:      Weight:  152.4 kg (335 lb 15.7 oz)    SpO2: 97%  99% 99%   Weight change:   Intake/Output Summary (Last 24 hours) at 09/16/14 0858 Last data filed at 09/15/14 1908  Gross per 24 hour  Intake    650 ml  Output    350 ml  Net    300 ml   General: Calm, alert, conversant morbidly obese woman lying comfortably in bed. HEENT:  EOMI, sclerae/conjunctiva clear, mmm no oropharyngeal erythema Cardiac: RRR, no m/r/g Pulmonary:  Diffuse mild expiratory wheezing, breath sounds normal, normal WOB Abdominal: Soft, obese, nontender Extremities: Trace pedal edema. 2+ bilateral dorsal pedal pulses  Lab Results: Basic Metabolic Panel:  Recent Labs Lab 09/15/14 1545  NA 140  K 3.4*  CL 101  CO2 28  GLUCOSE 120*  BUN 18  CREATININE 0.92  CALCIUM 8.7*  MG 2.4   Liver Function Tests:  Recent Labs Lab 09/15/14 1545  AST 21  ALT 27  ALKPHOS 66  BILITOT 0.4  PROT 6.2*  ALBUMIN 3.0*   CBC:  Recent Labs Lab 09/15/14 1545  WBC 19.4*  NEUTROABS 13.6*  HGB 12.5  HCT 39.6  MCV 87.2  PLT 328   Urine Drug Screen: Drugs of Abuse     Component Value Date/Time   LABOPIA NONE DETECTED 09/15/2014 1515   COCAINSCRNUR NONE DETECTED 09/15/2014 1515   LABBENZ NONE DETECTED 09/15/2014 1515   AMPHETMU NONE DETECTED 09/15/2014 1515   THCU NONE DETECTED  09/15/2014 1515   LABBARB NONE DETECTED 09/15/2014 1515    Studies/Results: X-ray Chest Pa And Lateral  09/15/2014   CLINICAL DATA:  Shortness of breath for several days, asthma  EXAM: CHEST  2 VIEW  COMPARISON:  Chest x-ray of 09/08/2014  FINDINGS: No active infiltrate or effusion is seen. Mild peribronchial thickening is noted which may indicate bronchitis. Mediastinal and hilar contours are unremarkable. The heart is within normal limits in size. No bony abnormality is seen.  IMPRESSION: No pneumonia or effusion. Peribronchial thickening may indicate bronchitis.   Electronically Signed   By: Ivar Drape M.D.   On: 09/15/2014 16:14   Medications: I have reviewed the patient's current medications. Scheduled Meds: . enoxaparin (LOVENOX) injection  40 mg Subcutaneous Q24H  . hydrochlorothiazide  12.5 mg Oral Daily  . ipratropium-albuterol  3 mL Nebulization Q4H  . loratadine  10 mg Oral Daily  . mometasone-formoterol  2 puff Inhalation BID  . nicotine  7 mg Transdermal Daily  . pantoprazole  40 mg Oral Daily  . predniSONE  40 mg Oral Q breakfast  . sodium chloride  3 mL Intravenous Q12H   Continuous Infusions:  PRN Meds:.acetaminophen, albuterol, traMADol, zolpidem Assessment/Plan: Principal Problem:   Asthma  exacerbation Active Problems:   Morbid obesity with body mass index of 50.0-59.9 in adult   History of tobacco abuse   Essential hypertension   Esophageal reflux   Obstructive sleep apnea   Back pain of thoracolumbar region   Seasonal allergic rhinitis   Morbid obesity 44 y.o. F PMH asthma, morbid obesity, OSA, anxiety, chronic LBP recently admitted 09/08/14-09/09/14 for asthma exacerbation, re-admitted from clinic for cough and frequent albuterol use.  Asthma Exacerbation: Pt likely has moderate to severe persistent asthma, given frequent rescue inhaler use with >weekly nighttime awakenings. Home meds: albuterol inhaler/nebulizer, Dulera (mometasone-formoterol), Xolair injection  q2wks (last: 09/02/2014, scheduled for today). SpO2 high 90s on RA. CXR clear. - Duonebs q4hr - Albuterol nebs q2hr PRN - Prednisone taper from 60 initiated last admission. Continue taper at current dose - today is day 2 of 5 for 40 mg daily, then 5 days 30 mg daily, then 5 days 20 mg daily, then 10 mg daily (which was home dose 2 months prior to previous admission). Pt will see her pulmonologist Dr. Annamaria Boots before end of taper. - Continue home mometasone-formoterol  Lower Back Pain, chronic: Home meds: Flexeril 5 mg TID PRN, tramadol 1-2 q6hr PRN. Previously on Percocet 10 mg every other day. - Continue home tramadol - Hold home flexeril as pt does not think it helps - APAP 650 mg q4hr PRN  Hypertension: Continue home HCTZ 12.5 mg daily  GERD: Protonix 40 mg  Anxiety: No home meds, previously on Zoloft. Pt reports anxiety triggers/worsens her asthma flares - Zoloft 50 mg daily, will start today after d/c  F/E/N: - None - NTD - H/H diet  PPX: Lovenox for DVT PPX  Dispo: Discharge anticipated today.   The patient does have a current PCP (Tasrif Ahmed, MD) and does need an [facility] hospital follow-up appointment after discharge.  The patient does not have transportation limitations that hinder transportation to clinic appointments.  .Services Needed at time of discharge: Y = Yes, Blank = No PT:   OT:   RN:   Equipment:   Other:     LOS: 1 day   Susa Day, Med Student 09/16/2014, 8:58 AM

## 2014-09-16 NOTE — Care Management Note (Signed)
Case Management Note  Patient Details  Name: April [name] MRN: [id number] Date of Birth: 10/06/1970  Subjective/Objective:            Asthma exac        Action/Plan:   Expected Discharge Date:  09/19/14               Expected Discharge Plan:  Home/Self Care  In-House Referral:     Discharge planning Services  CM Consult, Medication Assistance   Status of Service:  Completed, signed off  Medicare Important Message Given:  No Date Medicare IM Given:    Medicare IM give by:    Date Additional Medicare IM Given:    Additional Medicare Important Message give by:     If discussed at [place] of Stay Meetings, dates discussed:    Additional Comments: NCM spoke to pt and she works full-time at [employer] as [employer]. She has difficulty with paying for meds. Pt was provided [place] letter at last admission. Explained to pt she can receives once per year. Printed discount coupons for Zoloft and Albuterol Inhalations from [employer]. Zoloft will cost approximately $9.00 and Albuterol $6.00. Provided pt with info from the Healthcare.gov for reviewing how to sign up for insurance plan. Pt states she has info to apply for orange card to assist with copay and medications.  Erenest Rasher, RN 09/16/2014, 2:42 PM

## 2014-09-16 NOTE — Progress Notes (Signed)
Subjective: Breathing improved. Faint end-expiratory wheeze on exam, otherwise no significant findings.   Objective: Vital signs in last 24 hours: Filed Vitals:   09/16/14 0411 09/16/14 0500 09/16/14 0545 09/16/14 0758  BP:   163/86   Pulse:   78   Temp:   98.3 F (36.8 C)   TempSrc:   Oral   Resp:   17   Height:      Weight:  335 lb 15.7 oz (152.4 kg)    SpO2: 97%  99% 99%   Weight change:   Intake/Output Summary (Last 24 hours) at 09/16/14 1120 Last data filed at 09/16/14 1000  Gross per 24 hour  Intake    890 ml  Output    350 ml  Net    540 ml   Physical Exam: General: Obese AA female, alert, cooperative, NAD.  HEENT: PERRL, EOMI. Moist mucus membranes Neck: Full range of motion without pain, supple, no lymphadenopathy or carotid bruits Lungs: Slightly decreased air entry bilaterally, diffuse/faint end-expiratory wheezes. No rales or rhonchi.  Heart: RRR, no murmurs, gallops, or rubs Abdomen: Soft, obese non-tender, non-distended, BS + Extremities: No cyanosis, clubbing, or edema Neurologic: Alert & oriented x3, cranial nerves II-XII intact, strength grossly intact, sensation intact to light touch   Lab Results: Basic Metabolic Panel:  Recent Labs Lab 09/15/14 1545  NA 140  K 3.4*  CL 101  CO2 28  GLUCOSE 120*  BUN 18  CREATININE 0.92  CALCIUM 8.7*  MG 2.4   CBC:  Recent Labs Lab 09/15/14 1545  WBC 19.4*  NEUTROABS 13.6*  HGB 12.5  HCT 39.6  MCV 87.2  PLT 328   Urine Drug Screen: Drugs of Abuse     Component Value Date/Time   LABOPIA NONE DETECTED 09/15/2014 1515   COCAINSCRNUR NONE DETECTED 09/15/2014 1515   LABBENZ NONE DETECTED 09/15/2014 1515   AMPHETMU NONE DETECTED 09/15/2014 1515   THCU NONE DETECTED 09/15/2014 1515   LABBARB NONE DETECTED 09/15/2014 1515    Studies/Results: X-ray Chest Pa And Lateral  09/15/2014   CLINICAL DATA:  Shortness of breath for several days, asthma  EXAM: CHEST  2 VIEW  COMPARISON:  Chest x-ray of  09/08/2014  FINDINGS: No active infiltrate or effusion is seen. Mild peribronchial thickening is noted which may indicate bronchitis. Mediastinal and hilar contours are unremarkable. The heart is within normal limits in size. No bony abnormality is seen.  IMPRESSION: No pneumonia or effusion. Peribronchial thickening may indicate bronchitis.   Electronically Signed   By: Ivar Drape M.D.   On: 09/15/2014 16:14   Medications: I have reviewed the patient's current medications. Scheduled Meds: . enoxaparin (LOVENOX) injection  40 mg Subcutaneous Q24H  . hydrochlorothiazide  12.5 mg Oral Daily  . ipratropium-albuterol  3 mL Nebulization Q4H  . loratadine  10 mg Oral Daily  . mometasone-formoterol  2 puff Inhalation BID  . nicotine  7 mg Transdermal Daily  . pantoprazole  40 mg Oral Daily  . predniSONE  40 mg Oral Q breakfast  . sodium chloride  3 mL Intravenous Q12H   Continuous Infusions:  PRN Meds:.acetaminophen, albuterol, traMADol, zolpidem   Assessment/Plan: 44 y/o F w/ PMHx of HTN, COPD/Asthma, OSA, and morbid obesity, admitted from clinic for acute asthma exacerbation.   Acute Asthma Exacerbation: Breathing seems to be at baseline. PEF 400's.  Faint wheeze on exam.  -Continue Prednisone 40 mg today; discharge w/ previous taper; 40-30-20 mg daily taper for 5 days each (will remain  on 10 mg bid at end of taper as this is what she was taking prior to admission). -Continue DuoNeb q4h scheduled + Albuterol q2h prn -Continue Dulera 2 puffs bid -Follow up w/ PCP on discharge. Will also see Dr. Annamaria Boots today for Xolair injection and appt on 09/20/14.   Anxiety: Patient w/ baseline anxiety, think this contributes to some SOB and exacerbations of her asthma. Feel that she would benefit from long term treatment. States she has taken Zoloft in the past, says she thinks this was helpful, unclear why it was stopped but thinks it was because her anxiety resolved.  -Restart Zoloft 50 mg daily  HTN:  Stable, slightly elevated.  -Continue HCTZ 12.5 mg daily  GERD: Stable -Protonix  Chronic Back Pain: Stable -Flexeril + Tramadol + Tylenol prn  Dispo: Disposition is deferred at this time, awaiting improvement of current medical problems. Anticipated discharge today.   The patient does have a current PCP (Tasrif Ahmed, MD) and does need an [facility] hospital follow-up appointment after discharge.  The patient does not have transportation limitations that hinder transportation to clinic appointments.  .Services Needed at time of discharge: Y = Yes, Blank = No PT:   OT:   RN:   Equipment:   Other:     LOS: 1 day   [name], MD 09/16/2014, 11:20 AM

## 2014-09-20 ENCOUNTER — Ambulatory Visit: Payer: Self-pay | Admitting: Internal Medicine

## 2014-09-20 ENCOUNTER — Telehealth: Payer: Self-pay | Admitting: Internal Medicine

## 2014-09-20 NOTE — Telephone Encounter (Signed)
Pt had appointment with CY today and canceled. Next available with CY is not until 12/05/14. Pt wants to be seen before this time. She requests to see TP.  CY - are you okay with scheduling pt with TP? Thanks.

## 2014-09-20 NOTE — Telephone Encounter (Signed)
Pt has been scheduled to see TP tomorrow at 12pm. Nothing further was needed.

## 2014-09-20 NOTE — Telephone Encounter (Signed)
Per CY-okay to schedule with TP. Thanks.

## 2014-09-21 ENCOUNTER — Ambulatory Visit: Payer: Self-pay | Admitting: Adult Health

## 2014-09-21 ENCOUNTER — Ambulatory Visit: Payer: Self-pay

## 2014-09-22 ENCOUNTER — Telehealth: Payer: Self-pay | Admitting: Internal Medicine

## 2014-09-22 ENCOUNTER — Ambulatory Visit (INDEPENDENT_AMBULATORY_CARE_PROVIDER_SITE_OTHER): Payer: Self-pay

## 2014-09-22 DIAGNOSIS — J4540 Moderate persistent asthma, uncomplicated: Secondary | ICD-10-CM

## 2014-09-22 MED ORDER — OMALIZUMAB 150 MG ~~LOC~~ SOLR
225.0000 mg | Freq: Once | SUBCUTANEOUS | Status: AC
  Administered 2014-09-22: 225 mg via SUBCUTANEOUS

## 2014-09-22 NOTE — Telephone Encounter (Signed)
Call to patient to confirm appointment for 09/23/14 at 10:15 lmtcb

## 2014-09-23 ENCOUNTER — Ambulatory Visit: Payer: Self-pay | Admitting: Internal Medicine

## 2014-09-23 ENCOUNTER — Encounter: Payer: Self-pay | Admitting: Internal Medicine

## 2014-09-23 NOTE — Telephone Encounter (Signed)
#   vials:4 Ordered date:09/15/14 Shipping Date:09/21/14    # Vials:4 Arrival Date:09/22/14 Lot [id number] Exp Date:10/19

## 2014-09-28 ENCOUNTER — Ambulatory Visit: Payer: Self-pay

## 2014-10-06 ENCOUNTER — Ambulatory Visit: Payer: Self-pay

## 2014-10-17 ENCOUNTER — Telehealth: Payer: Self-pay | Admitting: Internal Medicine

## 2014-10-17 NOTE — Telephone Encounter (Signed)
Pt scheduled for appt for Xolair injection 10/19/14 at 1:45 Pt aware of rec's below. Nothing further needed.

## 2014-10-17 NOTE — Telephone Encounter (Signed)
lmtcb X1 for pt.   April [name] is there any reason why this pt cannot be put on the schedule for xolair?  Her last injection was 09/22/14, she gets her injections q14d and no-showed her last appt on 6/9 with no pending appointment.[name]

## 2014-10-17 NOTE — Telephone Encounter (Signed)
  Please check with Daleen Snook as far as Insurance-but I am okay with patient coming in to get her Xolair injections-she needs to be reminded that she needs to keep all her Xolair appts due to [facility] providing free medication-they will/can dismiss her from the program if they see she is not being compliant. Thanks.

## 2014-10-19 ENCOUNTER — Telehealth: Payer: Self-pay | Admitting: Internal Medicine

## 2014-10-19 ENCOUNTER — Ambulatory Visit: Payer: Self-pay

## 2014-10-19 NOTE — Telephone Encounter (Signed)
Patient was not able to get here for her appointment because her car broke down, her starter went out and she had to walk home from work.  She said that she has no way of getting here, her car is currently in the shop.  She said that she desperately needs her Xolair shot, but she is having a hard time getting to our office to get the shot.  She is worried that [facility] will not let her schedule another appointment since she is missing this appointment.  She wants to speak to [facility].  To Joellen Jersey - to follow up

## 2014-10-20 NOTE — Telephone Encounter (Signed)
lmomtcb x1 

## 2014-10-20 NOTE — Telephone Encounter (Signed)
Please let patient know that she can [facility] appt-I would not refuse patient her needed injections. However patient has been told previously that the [facility] (where she gets her medication for free) can deny sending Xolair if she continues to miss appt. Thanks.

## 2014-10-21 NOTE — Telephone Encounter (Signed)
Patient wants nurse to call her.  She requested Xolair inj today and told her it would be Mon, she wants to talk to nurse.

## 2014-10-21 NOTE — Telephone Encounter (Signed)
I am not going to squeeze her in. She should be seen by her PCP until she comes back for a regular scheduled OV appointment.

## 2014-10-21 NOTE — Telephone Encounter (Signed)
lmtcb X2 for pt- she only needs to be scheduled for xolair appt.

## 2014-10-21 NOTE — Telephone Encounter (Signed)
Left detailed letting her know that we will see her first available appt for CY or TP.  Patient needs to see PCP in meantime for her acute symptoms.  Pt aware to keep her upcoming appt on 10/24/14 for XOLAIR inj (do not cancel this appt)

## 2014-10-21 NOTE — Telephone Encounter (Signed)
Pt returned call - [phone]. Pt states leave her a vm.

## 2014-10-21 NOTE — Telephone Encounter (Signed)
LMTCB x 1 

## 2014-10-21 NOTE — Telephone Encounter (Signed)
Pt calling requesting another Xolair appt, was unable to make appt scheduled this week d/t her car not starting.  Pt rescheduled for 10/24/14 at 2:15 for inj.  Pt requesting to be seen by CY or TP asap for c/o SOB and wheezing. Pt has NOS/cxd multiple appts. Was scheduled with TP 08/2014 and No Showed(NOS) this appt.  Please advise Dr Annamaria Boots if you can work patient in to be seen next week. Tammy Parrett's first available isnt until 11/10/14.  No Known Allergies   Medication List       This list is accurate as of: 10/19/14 11:59 PM.  Always use your most recent med list.               albuterol (2.5 MG/3ML) 0.083% nebulizer solution  Commonly known as:  PROVENTIL  Take 3 mLs (2.5 mg total) by nebulization every 4 (four) hours as needed for wheezing or shortness of breath.     albuterol 108 (90 BASE) MCG/ACT inhaler  Commonly known as:  PROVENTIL HFA;VENTOLIN HFA  Inhale 1-2 puffs into the lungs every 4 (four) hours as needed for wheezing or shortness of breath.     hydrochlorothiazide 12.5 MG capsule  Commonly known as:  MICROZIDE  Take 1 capsule (12.5 mg total) by mouth daily.     ibuprofen 200 MG tablet  Commonly known as:  MOTRIN IB  Take 4 tablets (800 mg total) by mouth every 8 (eight) hours as needed for moderate pain.     ipratropium 0.02 % nebulizer solution  Commonly known as:  ATROVENT  Take 2.5 mLs (0.5 mg total) by nebulization 4 (four) times daily.     loratadine 10 MG tablet  Commonly known as:  CLARITIN  Take 1 tablet (10 mg total) by mouth daily.     mometasone-formoterol 200-5 MCG/ACT Aero  Commonly known as:  DULERA  Inhale 2 puffs into the lungs 2 (two) times daily. Rinse mouth after inhalation     omalizumab 150 MG injection  Commonly known as:  XOLAIR  Inject 150 mg into the skin every 14 (fourteen) days.     pantoprazole 20 MG tablet  Commonly known as:  PROTONIX  Take 20 mg by mouth daily.     predniSONE 10 MG tablet  Commonly known as:  DELTASONE   Take 1 tablet (10 mg total) by mouth 2 (two) times daily with a meal.     sertraline 50 MG tablet  Commonly known as:  ZOLOFT  Take 1 tablet (50 mg total) by mouth daily.     traMADol 50 MG tablet  Commonly known as:  ULTRAM  1-2 tabs po q 6 hr prn pain Maximum dose= 8 tablets per day     zolpidem 10 MG tablet  Commonly known as:  AMBIEN  Take 10 mg by mouth at bedtime as needed for sleep.

## 2014-10-24 ENCOUNTER — Ambulatory Visit (INDEPENDENT_AMBULATORY_CARE_PROVIDER_SITE_OTHER): Payer: Self-pay | Admitting: Internal Medicine

## 2014-10-24 ENCOUNTER — Encounter: Payer: Self-pay | Admitting: Internal Medicine

## 2014-10-24 ENCOUNTER — Ambulatory Visit: Payer: Self-pay

## 2014-10-24 VITALS — BP 138/82 | HR 81 | Ht 66.0 in | Wt 334.4 lb

## 2014-10-24 DIAGNOSIS — Z6841 Body Mass Index (BMI) 40.0 and over, adult: Secondary | ICD-10-CM

## 2014-10-24 DIAGNOSIS — J4540 Moderate persistent asthma, uncomplicated: Secondary | ICD-10-CM

## 2014-10-24 DIAGNOSIS — J449 Chronic obstructive pulmonary disease, unspecified: Secondary | ICD-10-CM

## 2014-10-24 IMAGING — CR DG FOOT COMPLETE 3+V*L*
3 series · 3 of 3 positions shown · non-contrast
Comparison: Ankle series [DATE]

CLINICAL DATA: Twisted foot.  Pain.

EXAM:
LEFT FOOT - COMPLETE 3+ VIEW

[view not recorded (1 of 3)]
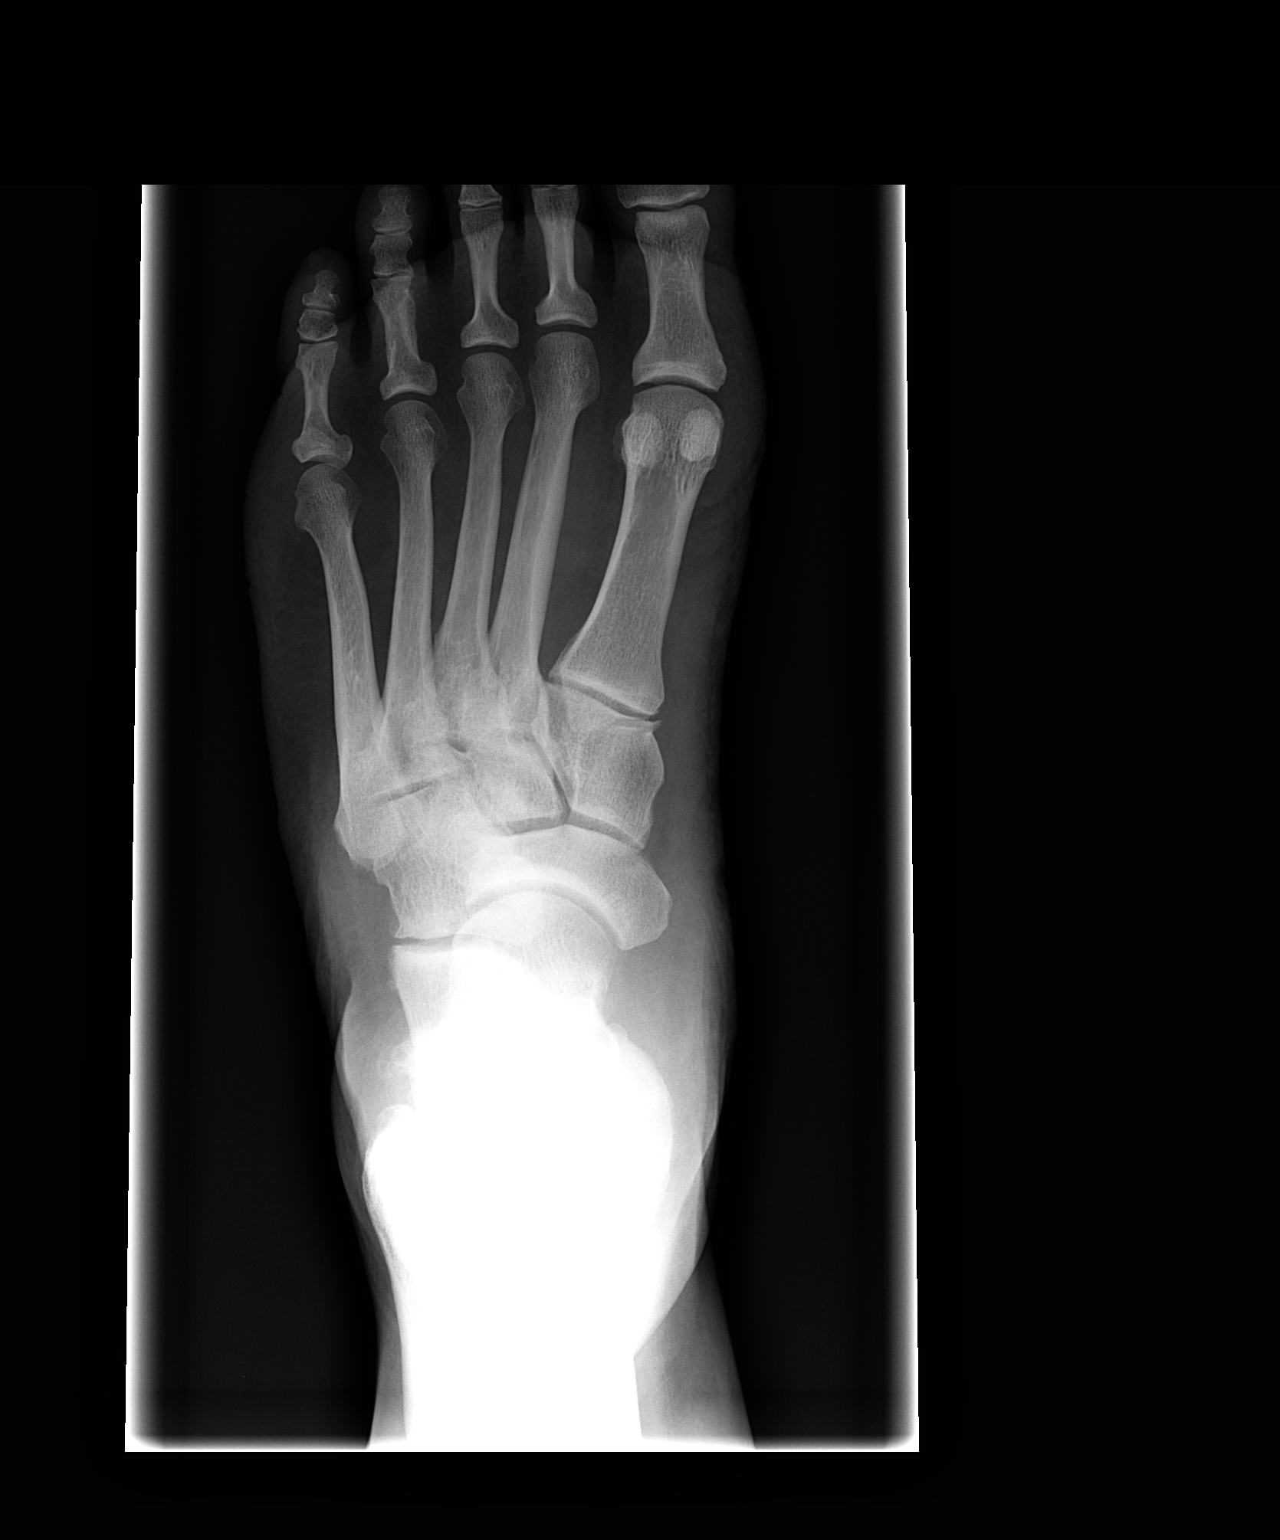

[view not recorded (2 of 3)]
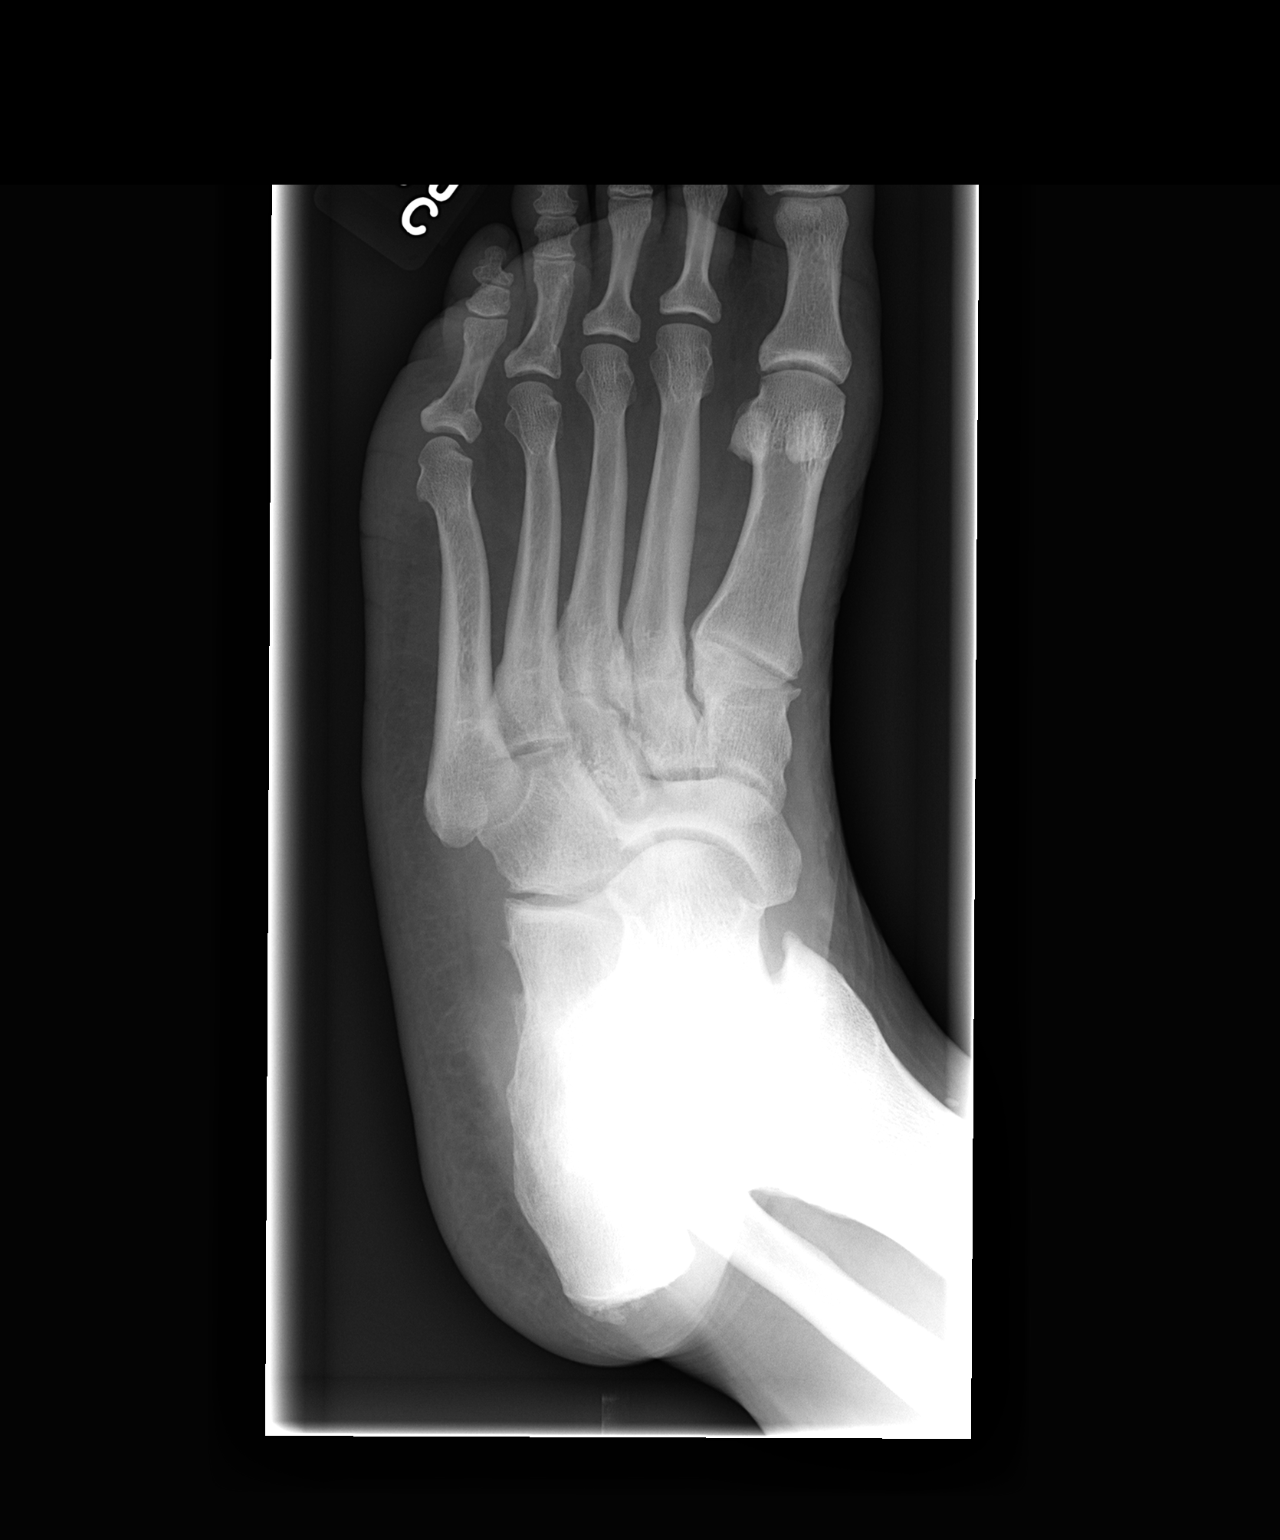

[view not recorded (3 of 3)]
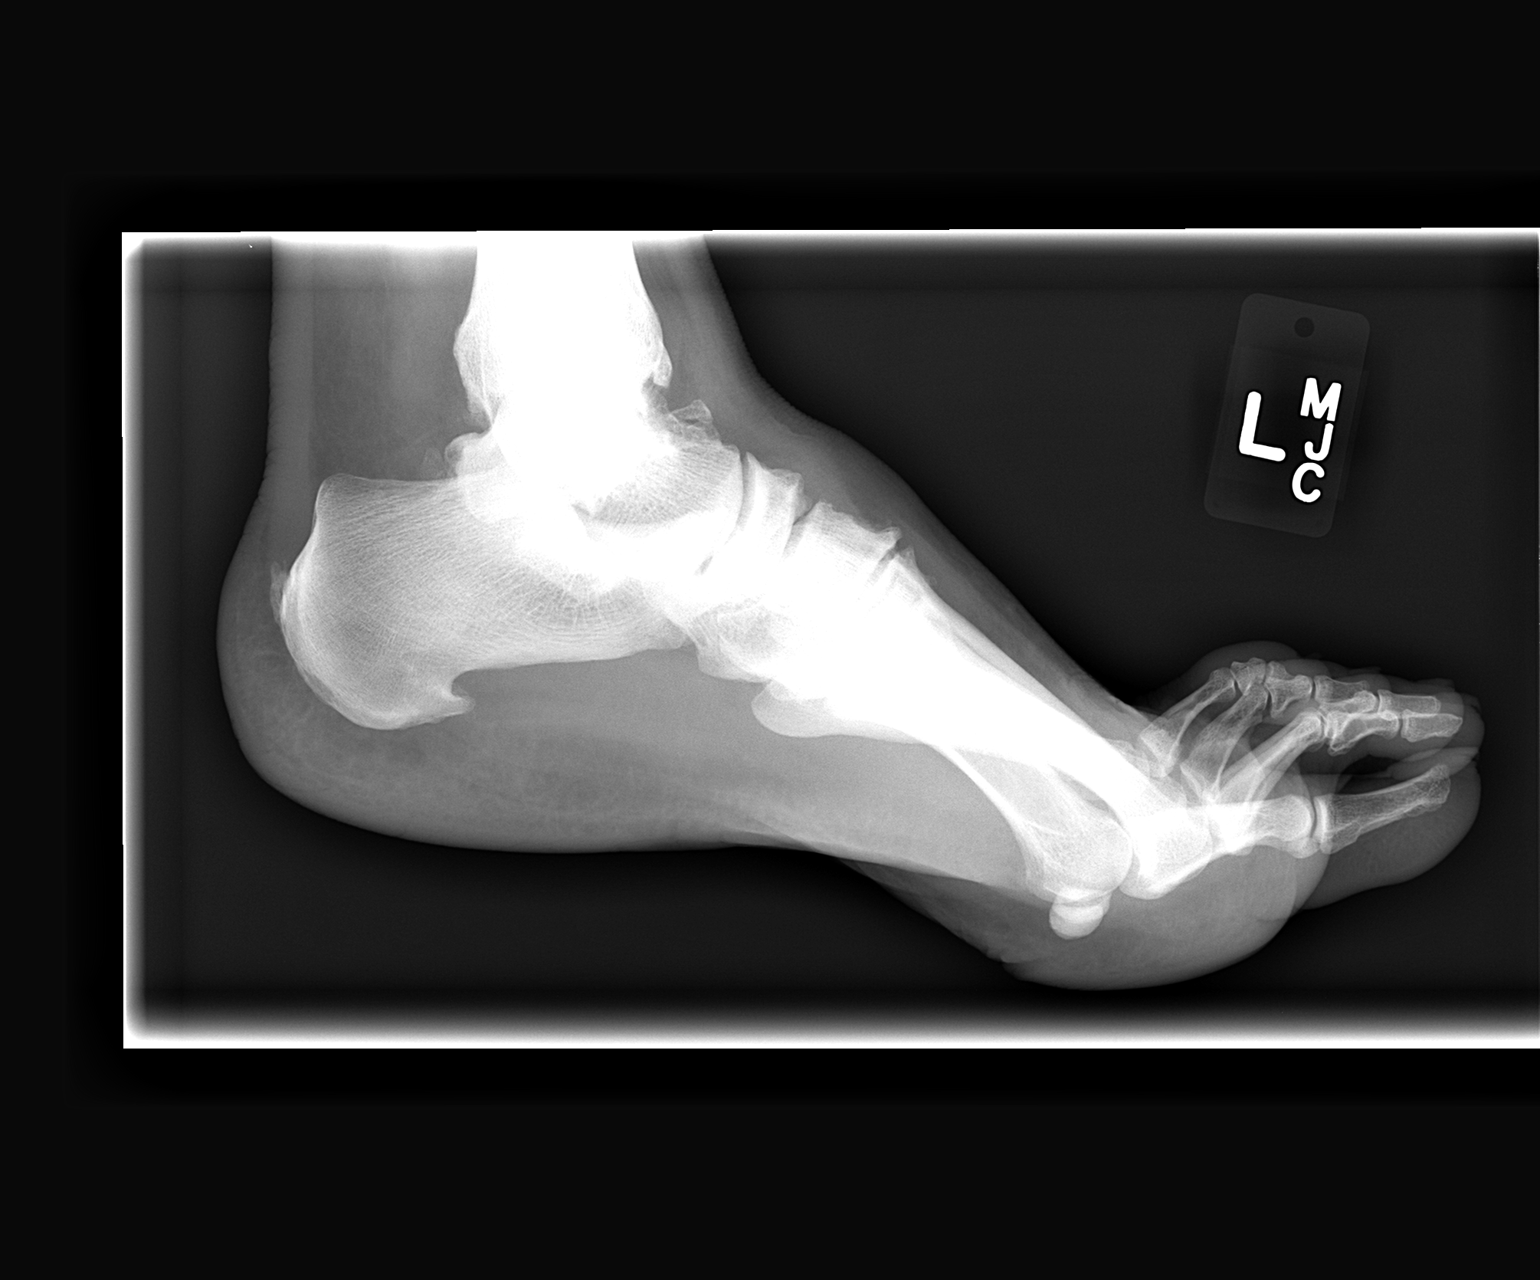

[3 of 3 positions shown; findings below may reference images not displayed]

FINDINGS: No acute bony abnormality. Specifically, no fracture, subluxation,
or dislocation. Soft tissues are intact. Mild arthritic changes in
the midfoot. Plantar calcaneal spur.
IMPRESSION: No acute bony abnormality.

## 2014-10-24 MED ORDER — PREDNISONE 10 MG PO TABS: 10.0000 mg | ORAL_TABLET | Freq: Two times a day (BID) | ORAL | Status: DC

## 2014-10-24 NOTE — Progress Notes (Signed)
Patient ID: April [name], female    DOB: [birth year], 44 y.o.   MRN: [id number]  HPI 44 yoF former smoker followed for chronic severe asthma. Hospitalized 4/11-12/12 for another exacerbation she says was triggered by catching a cold. Common triggers include colds and any smoke exposure. Back now to baseline. She has continued Xolair injection here every 2 weeks and does feel it has reduced frequency of flare-ups. Usual weight range 320-340 and she asks why weight is up to 360. Discussed steroids, including IV therapy at hospital but also ?role of Dulera 200-5. She is now being seen [facility] anticipating breast reduction surgery and we talked about bariatric surgery. Takes omeprazole twice daily before meals, but has frequent heart burn.   04/12/11- 44 yoF former smoker followed for chronic severe asthma. Has had flu vaccine and pertussis vaccine. Says her breathing is okay. We had sent antibiotic for a cold with bronchitis around Thanksgiving. She denies any smoking at all now. She has avoided hospital partly because she has been better and partly because she is unwilling to go. Xolair has made a real improvement. She had breast reduction surgery without problems at [facility] and has considered bariatric surgery but can't afford it. Uses her nebulizer machine but not every day. Does continue Dulera and omeprazole. She is taking Ambien but still sleeps poorly at night. She no longer misses much work but that means she can't nap in the daytime. Complains of frequently waking at night but does not know if she snores.  04/25/11-  44 yoF former smoker followed for chronic severe asthma complicated by morbid obesity, HBP, depression. Her antidepressant was changed to Wellbutrin on December 18. Since then she can't sleep, using 3 Ambien per night. Increased asthma with wheezing. She increased to [facility] to 6 puffs daily. Had a cold at Thanksgiving but not since. She continues Xolair.  08/06/11- 44 yoF former  smoker followed for chronic severe asthma complicated by morbid obesity, HBP, depression, GERD. Change jobs and insurance. Had a lapse of 2 weeks in her Xolair therapy but ready to restart. Work environment is clear and cool, indoors. Watery rhinorrhea not helped by 3 intermittent use of Flonase. No acute asthma attack but uses her Dulera and rescue inhaler every night at bedtime. Ambien helps sleep.  10/02/12- 44 yoF former smoker followed for chronic severe asthma complicated by morbid obesity, HBP, depression, GERD. FOLLOWS FOR: had a few hosptial visits recently due to insurance and PA for [facility] to have medication on Tuesday 10-06-12 and can resume Xolair then. Continues to have trouble sleeping (falling and staying) and would like to go back on Ambien if possible. Throat itch x several days, dry cough. ER again 2 weeks ago. Off Xolair x 5 weeks but has insurance ok now to restart. She started some left over prednisone. Aware occasional reflux- discussed precautions. CXR 05/22/12 IMPRESSION:  No acute infiltrate or pulmonary edema. Mild perihilar  increased bronchial markings without focal consolidation.  Original Report Authenticated By: Lahoma Crocker, M.D.   11/09/2012 [facility] follow up follow up  Recent admitted for Acute asthma flare. Tx w/ abx , steroids and nebs. Reports doing well since discharge.  finished prednisone and abx from hosp but currently on Cipro for UTI.  no new complaints; reports breathing is back to baseline.   Had restarted smoking but has quit . We discussed med compliance. No fever, chest pain, orthopnea, hemoptysis.  No edema.  11/09/2012 CXR Stable, with suspected artifactual density in the medial  right  upper lung related to summation. No acute cardiopulmonary  Abnormality.  01/15/13- 44 yoF former smoker followed for chronic severe asthma complicated by morbid obesity, HBP, depression, GERD. FOLLOWS FOR: patient is not working but has insurance-medications costly and  unable to afford at times. Had been getting Xolair shots regularly, but has had to skip while insurance is worked out. Has had flu vaccine. New problem-insomnia. Difficulty initiating and maintaining sleep not adequately helped by clonazepam. Irregular bedtime. Gets up between 4 and 5 AM. Previously used Ambien but blames it for hallucination and was using too much. Sleep study ordered 2012 but she cannot get it done CXR 12/22/12 IMPRESSION:  No acute cardiopulmonary disease. Specifically, no evidence of  pneumonia.  Original Report Authenticated By: [name], MD  04/08/13- 44 yoF former smoker followed for chronic severe asthma/ Xolair complicated by OSA/ CPAP,  morbid obesity, HBP, depression, GERD FOLLOWS FOR: still on Xolair; continues to wheeze alot. Feels like since taking clonazepam she wakes up with the feeling of not being able to breath. She describes waking wheezing and short of breath after taking clonazepam. It sounds as if she just sleeps deeply. Ambien caused hallucination when she was taking 2 or 3 at a time. She says she was "given" her CPAP machine at the hospital but with no followup instructions and no explanation as to how to use it.  NPSG 03/31/07- AHI 5.8/ hr, weight 330 lbs. She canceled planned more recent study.  08/02/13- 44 yoF former smoker followed for chronic severe asthma/ Xolair complicated by OSA/ CPAP,  morbid obesity, HBP, depression, GERD ACUTE VISIT: had Xolair today; having increased nasal congestion, wheezing and SOB,headaches-using nebulizers more than usual. Just ran out of Dulera. Visit on prednisone 20 mg daily ear he taking omeprazole only when needed. Aware of reflux at night. Plain spring pollen for increased nasal congestion. 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 CXR 3/21/5 IMPRESSION:  Mild chronic bronchitic changes.  No acute abnormalities.  Electronically Signed  By: Lavonia Dana M.D.  On: 07/17/2013 19:02  12/30/13- 76 yoF former  smoker followed for chronic severe asthma/ Xolair complicated by OSA/ CPAP,  morbid obesity, HBP, depression, GERD FOLLOWS FOR: Pt states she has good and bad days. Pt states she has increase SOB in morning when waking up. Pt c/o mild dry cough. Pt denies CP/tightness.  Has Dulera 100, not quite enough help. No rescue inhaler.Has nebulizer. Wants to avoid ER, but frequent trips. Wants to avoid prednisone- bursts drive weight up. None in 2 days. Discussed low maintenance to stabilize. Off Xolair since divorce stopped her health insurance. Assistance program being explored.  Works third shift.. Significant difficulty initiating and maintaining sleep. OTC diphenhydramine not helpful. CXR 11/23/13 IMPRESSION:  No active cardiopulmonary disease.  Electronically Signed  By: Dereck Ligas M.D.  On: 11/23/2013 12:06   03/30/14- 22 yoF former smoker followed for chronic severe asthma/ Xolair complicated by OSA/ CPAP,  morbid obesity, HBP, depression, GERD FOLLOWS FOR: Post hospital-was kept overnight 11-29 through 11-30 for asthma exacerbation; pt also wants to have her Xolair injection today. Continues to wheeze and not back at baseline. Asthma had been getting worse for 2 days before she went to ER. Still some wheeze and "not back to baseline. 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 Still feels tight with scant white sputum. Taking prednisone 10 mg daily. Throat itches and she coughs at night while working. Continues Xolair  but lack of transportation causes occasional missed dose. Chronic insomnia-Ambien is too expensive. Hoping to avoid increased steroid dose because of difficulty with weight control. CXR 03/27/14 IMPRESSION: No acute infiltrate or pulmonary edema. Central mild bronchitic changes. Electronically Signed  By: Lahoma Crocker M.D.  On: 03/27/2014 14:05  10/24/14- 13 yoF former smoker followed for chronic severe asthma/ Xolair  complicated by OSA/ CPAP,  morbid obesity, HBP, depression, GERD ACUTE VISIT: Continues to have wheezing episodes; feels as though Ruthe Mannan is no longer working. Many missed appointments, ER trips hoping to avoid co-pay. Nebulizer twice daily. Got a Xolair injection today but has missed many. Wheezing often wakes her or is present when she wakes up. Says [employer] Mannan was not helpful. Continues prednisone 10 mg twice daily. Office spirometry 10/24/14- severe obstructive airways disease. FVC 2.10/66%, FEV1 1.25/47%, FEV1/FVC 0.59, FEF 25-75 percent 0.64/19%. CXR 09/15/14- IMPRESSION: No pneumonia or effusion. Peribronchial thickening may indicate bronchitis. Electronically Signed  By: Ivar Drape M.D.  On: 09/15/2014 16:14   Review of Systems-See HPI Constitutional:   No-   weight loss, night sweats, fevers, chills, +fatigue, lassitude. HEENT:   No-  headaches, difficulty swallowing, tooth/dental problems, sore throat,       + sneezing, itching, ear ache, +nasal congestion, post nasal drip,  CV:  No-   chest pain, orthopnea, PND, swelling in lower extremities, anasarca, dizziness, palpitations Resp: +shortness of breath with exertion or at rest.             + productive cough,  + non-productive cough,  No- coughing up of blood.              No-   change in color of mucus. +wheezing.   Skin: No-   rash or lesions. GI:  No-   heartburn, indigestion, abdominal pain, nausea, vomiting,  GU: MS:  No-   joint pain or swelling.   Neuro-     nothing unusual Psych:  No- change in mood or affect. No depression or anxiety.  No memory loss.    Objective:   Physical Exam General- Alert, Oriented, Affect-appropriate, Distress- none acute, + morbid                       obesity,  Skin- rash-none, lesions- none, excoriation- none Lymphadenopathy- none Head- atraumatic            Eyes- Gross vision intact, PERRLA, conjunctivae clear secretions            Ears- Hearing, canals-normal            Nose- Clear,  no-Septal dev, mucus, polyps, erosion, perforation             Throat- Mallampati II , mucosa clear , drainage- none, tonsils- atrophic,           Neck- flexible , trachea midline, no stridor , thyroid nl, carotid no bruit Chest - symmetrical excursion , unlabored           Heart/CV- RRR , no murmur , no gallop  , no rub, nl s1 s2                           - JVD- none , edema- none, stasis changes- none, varices- none           Lung- +Diminished/ clear now, unlabored.  Wheeze-none, cough-none,  dullness-none,  rub- none           Chest wall-  Abd-  Br/ Gen/ Rectal- Not done, not indicated Extrem- cyanosis- none, clubbing, none, atrophy- none, strength- nl Neuro- grossly intact to observation

## 2014-10-24 NOTE — Telephone Encounter (Signed)
Pt scheduled to see TP (first available) November 16, 2014 at 12pm for Routine OV.  Pt having increased SOB, cough and a lot of wheezing.  Pt notes wheezing is worse in AM when waking up.  Pt states that she almost went to ED last night Pt is requesting refill of Prednisone.  Pt supposed to have Xolair injection today at 2:15 - pt states that she is doing better right now but if symptoms return then she is going to go to ED if she cant be seen here.   Please advise Dr Annamaria Boots. Thanks.  No Known Allergies   Medication List       This list is accurate as of: 10/19/14 11:59 PM.  Always use your most recent med list.               albuterol (2.5 MG/3ML) 0.083% nebulizer solution  Commonly known as:  PROVENTIL  Take 3 mLs (2.5 mg total) by nebulization every 4 (four) hours as needed for wheezing or shortness of breath.     albuterol 108 (90 BASE) MCG/ACT inhaler  Commonly known as:  PROVENTIL HFA;VENTOLIN HFA  Inhale 1-2 puffs into the lungs every 4 (four) hours as needed for wheezing or shortness of breath.     hydrochlorothiazide 12.5 MG capsule  Commonly known as:  MICROZIDE  Take 1 capsule (12.5 mg total) by mouth daily.     ibuprofen 200 MG tablet  Commonly known as:  MOTRIN IB  Take 4 tablets (800 mg total) by mouth every 8 (eight) hours as needed for moderate pain.     ipratropium 0.02 % nebulizer solution  Commonly known as:  ATROVENT  Take 2.5 mLs (0.5 mg total) by nebulization 4 (four) times daily.     loratadine 10 MG tablet  Commonly known as:  CLARITIN  Take 1 tablet (10 mg total) by mouth daily.     mometasone-formoterol 200-5 MCG/ACT Aero  Commonly known as:  DULERA  Inhale 2 puffs into the lungs 2 (two) times daily. Rinse mouth after inhalation     omalizumab 150 MG injection  Commonly known as:  XOLAIR  Inject 150 mg into the skin every 14 (fourteen) days.     pantoprazole 20 MG tablet  Commonly known as:  PROTONIX  Take 20 mg by mouth daily.     predniSONE  10 MG tablet  Commonly known as:  DELTASONE  Take 1 tablet (10 mg total) by mouth 2 (two) times daily with a meal.     sertraline 50 MG tablet  Commonly known as:  ZOLOFT  Take 1 tablet (50 mg total) by mouth daily.     traMADol 50 MG tablet  Commonly known as:  ULTRAM  1-2 tabs po q 6 hr prn pain Maximum dose= 8 tablets per day     zolpidem 10 MG tablet  Commonly known as:  AMBIEN  Take 10 mg by mouth at bedtime as needed for sleep.

## 2014-10-24 NOTE — Telephone Encounter (Signed)
Discussed with April [name]- She has been very non-compliant with visits to all care-givers. If she needs care today when she comes for injection, we will see her in effort to reduce ER visits and seek a way to help her.

## 2014-10-24 NOTE — Telephone Encounter (Signed)
Pt came in for Xolair injection(s) today and was seen by CY for acute visit. Nothing more needed at this time.

## 2014-10-24 NOTE — Patient Instructions (Addendum)
Order- office spirometry   Dx asthma moderate persistent uncontrolled- done  Sample Stiolto Repimat inhaler   2 puffs, one time daily Try this instead of Dulera  Tylenol PM or Zzquil may help sleep

## 2014-10-25 ENCOUNTER — Telehealth: Payer: Self-pay | Admitting: Internal Medicine

## 2014-10-25 MED ORDER — OMALIZUMAB 150 MG ~~LOC~~ SOLR
225.0000 mg | Freq: Once | SUBCUTANEOUS | Status: AC
  Administered 2014-10-24: 225 mg via SUBCUTANEOUS

## 2014-10-25 NOTE — Telephone Encounter (Signed)
Patient has not heard from the pharmacy regarding her Prednisone, so she called our office to see if we sent them the prescription.  I advised patient that the prescription has been sent to the pharmacy. Nothing further needed.

## 2014-10-30 NOTE — Assessment & Plan Note (Signed)
Asthma with a fixed obstructive component probably reflecting previous smoking and a fixed asthma component. We need to lowest dose of systemic steroids that will control her. She has not benefited much from inhaled steroids but compliance is doubtful. She has not been able to manage Xolair as regularly as she needs. Plan-reduce prednisone to 15 mg daily, try sample Anoro Ellipta

## 2014-10-30 NOTE — Assessment & Plan Note (Signed)
No real effort to lose weight

## 2014-10-31 ENCOUNTER — Emergency Department (HOSPITAL_COMMUNITY)
Admission: EM | Admit: 2014-10-31 | Discharge: 2014-11-01 | Disposition: A | Discharge status: Home / Self Care | Payer: Self-pay | Attending: Emergency Medicine | Admitting: Emergency Medicine

## 2014-10-31 ENCOUNTER — Emergency Department (HOSPITAL_COMMUNITY): Payer: Self-pay

## 2014-10-31 ENCOUNTER — Encounter (HOSPITAL_COMMUNITY): Payer: Self-pay | Admitting: Emergency Medicine

## 2014-10-31 DIAGNOSIS — R062 Wheezing: Secondary | ICD-10-CM

## 2014-10-31 DIAGNOSIS — Z872 Personal history of diseases of the skin and subcutaneous tissue: Secondary | ICD-10-CM | POA: Insufficient documentation

## 2014-10-31 DIAGNOSIS — H1131 Conjunctival hemorrhage, right eye: Secondary | ICD-10-CM | POA: Insufficient documentation

## 2014-10-31 DIAGNOSIS — Z87891 Personal history of nicotine dependence: Secondary | ICD-10-CM | POA: Insufficient documentation

## 2014-10-31 DIAGNOSIS — Z8742 Personal history of other diseases of the female genital tract: Secondary | ICD-10-CM | POA: Insufficient documentation

## 2014-10-31 DIAGNOSIS — F329 Major depressive disorder, single episode, unspecified: Secondary | ICD-10-CM | POA: Insufficient documentation

## 2014-10-31 DIAGNOSIS — I10 Essential (primary) hypertension: Secondary | ICD-10-CM | POA: Insufficient documentation

## 2014-10-31 DIAGNOSIS — J441 Chronic obstructive pulmonary disease with (acute) exacerbation: Secondary | ICD-10-CM | POA: Insufficient documentation

## 2014-10-31 DIAGNOSIS — Z79899 Other long term (current) drug therapy: Secondary | ICD-10-CM | POA: Insufficient documentation

## 2014-10-31 DIAGNOSIS — J40 Bronchitis, not specified as acute or chronic: Secondary | ICD-10-CM

## 2014-10-31 DIAGNOSIS — J45901 Unspecified asthma with (acute) exacerbation: Secondary | ICD-10-CM

## 2014-10-31 DIAGNOSIS — G4700 Insomnia, unspecified: Secondary | ICD-10-CM | POA: Insufficient documentation

## 2014-10-31 DIAGNOSIS — M25512 Pain in left shoulder: Secondary | ICD-10-CM | POA: Insufficient documentation

## 2014-10-31 DIAGNOSIS — M25519 Pain in unspecified shoulder: Secondary | ICD-10-CM

## 2014-10-31 DIAGNOSIS — J069 Acute upper respiratory infection, unspecified: Secondary | ICD-10-CM | POA: Insufficient documentation

## 2014-10-31 DIAGNOSIS — K219 Gastro-esophageal reflux disease without esophagitis: Secondary | ICD-10-CM | POA: Insufficient documentation

## 2014-10-31 DIAGNOSIS — Z8619 Personal history of other infectious and parasitic diseases: Secondary | ICD-10-CM | POA: Insufficient documentation

## 2014-10-31 MED ORDER — KETOROLAC TROMETHAMINE 60 MG/2ML IM SOLN
60.0000 mg | Freq: Once | INTRAMUSCULAR | Status: AC
  Administered 2014-10-31: 60 mg via INTRAMUSCULAR
  Filled 2014-10-31: qty 2

## 2014-10-31 MED ORDER — CYCLOBENZAPRINE HCL 10 MG PO TABS: 10.0000 mg | ORAL_TABLET | Freq: Two times a day (BID) | ORAL | Status: DC | PRN

## 2014-10-31 MED ORDER — PREDNISONE 20 MG PO TABS: 40.0000 mg | ORAL_TABLET | Freq: Every day | ORAL | Status: DC

## 2014-10-31 MED ORDER — IPRATROPIUM-ALBUTEROL 0.5-2.5 (3) MG/3ML IN SOLN
3.0000 mL | Freq: Once | RESPIRATORY_TRACT | Status: AC
  Administered 2014-10-31: 3 mL via RESPIRATORY_TRACT
  Filled 2014-10-31: qty 3

## 2014-10-31 MED ORDER — ALBUTEROL SULFATE HFA 108 (90 BASE) MCG/ACT IN AERS
2.0000 | INHALATION_SPRAY | Freq: Once | RESPIRATORY_TRACT | Status: AC
  Administered 2014-10-31: 2 via RESPIRATORY_TRACT
  Filled 2014-10-31: qty 6.7

## 2014-10-31 MED ORDER — IPRATROPIUM BROMIDE 0.02 % IN SOLN
0.5000 mg | Freq: Once | RESPIRATORY_TRACT | Status: AC
  Administered 2014-10-31: 0.5 mg via RESPIRATORY_TRACT
  Filled 2014-10-31: qty 2.5

## 2014-10-31 MED ORDER — LEVOFLOXACIN 750 MG PO TABS: 750.0000 mg | ORAL_TABLET | Freq: Every day | ORAL | Status: DC

## 2014-10-31 MED ORDER — HYDROCODONE-ACETAMINOPHEN 5-325 MG PO TABS: 2.0000 | ORAL_TABLET | ORAL | Status: DC | PRN

## 2014-10-31 MED ORDER — PREDNISONE 20 MG PO TABS
60.0000 mg | ORAL_TABLET | Freq: Every day | ORAL | Status: DC
Start: 2014-11-01 — End: 2014-11-01
  Administered 2014-10-31: 60 mg via ORAL
  Filled 2014-10-31: qty 3

## 2014-10-31 MED ORDER — FLUORESCEIN SODIUM 1 MG OP STRP
1.0000 | ORAL_STRIP | Freq: Once | OPHTHALMIC | Status: AC
  Administered 2014-10-31: 1 via OPHTHALMIC
  Filled 2014-10-31: qty 1

## 2014-10-31 MED ORDER — ALBUTEROL SULFATE (2.5 MG/3ML) 0.083% IN NEBU
5.0000 mg | INHALATION_SOLUTION | Freq: Once | RESPIRATORY_TRACT | Status: AC
  Administered 2014-10-31: 5 mg via RESPIRATORY_TRACT
  Filled 2014-10-31: qty 6

## 2014-10-31 MED ORDER — TETRACAINE HCL 0.5 % OP SOLN
1.0000 [drp] | Freq: Once | OPHTHALMIC | Status: AC
  Administered 2014-10-31: 1 [drp] via OPHTHALMIC
  Filled 2014-10-31: qty 2

## 2014-10-31 MED ORDER — IBUPROFEN 800 MG PO TABS: 800.0000 mg | ORAL_TABLET | Freq: Three times a day (TID) | ORAL | Status: DC

## 2014-10-31 NOTE — Discharge Instructions (Signed)
Arthritis, Nonspecific °Arthritis is inflammation of a joint. This usually means pain, redness, warmth or swelling are present. One or more joints may be involved. There are a number of types of arthritis. Your caregiver may not be able to tell what type of arthritis you have right away. °CAUSES  °The most common cause of arthritis is the wear and tear on the joint (osteoarthritis). This causes damage to the cartilage, which can break down over time. The knees, hips, back and neck are most often affected by this type of arthritis. °Other types of arthritis and common causes of joint pain include: °· Sprains and other injuries near the joint. Sometimes minor sprains and injuries cause pain and swelling that develop hours later. °· Rheumatoid arthritis. This affects hands, feet and knees. It usually affects both sides of your body at the same time. It is often associated with chronic ailments, fever, weight loss and general weakness. °· Crystal arthritis. Gout and pseudo gout can cause occasional acute severe pain, redness and swelling in the foot, ankle, or knee. °· Infectious arthritis. Bacteria can get into a joint through a break in overlying skin. This can cause infection of the joint. Bacteria and viruses can also spread through the blood and affect your joints. °· Drug, infectious and allergy reactions. Sometimes joints can become mildly painful and slightly swollen with these types of illnesses. °SYMPTOMS  °· Pain is the main symptom. °· Your joint or joints can also be red, swollen and warm or hot to the touch. °· You may have a fever with certain types of arthritis, or even feel overall ill. °· The joint with arthritis will hurt with movement. Stiffness is present with some types of arthritis. °DIAGNOSIS  °Your caregiver will suspect arthritis based on your description of your symptoms and on your exam. Testing may be needed to find the type of arthritis: °· Blood and sometimes urine tests. °· X-ray tests  and sometimes CT or MRI scans. °· Removal of fluid from the joint (arthrocentesis) is done to check for bacteria, crystals or other causes. Your caregiver (or a specialist) will numb the area over the joint with a local anesthetic, and use a needle to remove joint fluid for examination. This procedure is only minimally uncomfortable. °· Even with these tests, your caregiver may not be able to tell what kind of arthritis you have. Consultation with a specialist (rheumatologist) may be helpful. °TREATMENT  °Your caregiver will discuss with you treatment specific to your type of arthritis. If the specific type cannot be determined, then the following general recommendations may apply. °Treatment of severe joint pain includes: °· Rest. °· Elevation. °· Anti-inflammatory medication (for example, ibuprofen) may be prescribed. Avoiding activities that cause increased pain. °· Only take over-the-counter or prescription medicines for pain and discomfort as recommended by your caregiver. °· Cold packs over an inflamed joint may be used for 10 to 15 minutes every hour. Hot packs sometimes feel better, but do not use overnight. Do not use hot packs if you are diabetic without your caregiver's permission. °· A cortisone shot into arthritic joints may help reduce pain and swelling. °· Any acute arthritis that gets worse over the next 1 to 2 days needs to be looked at to be sure there is no joint infection. °Long-term arthritis treatment involves modifying activities and lifestyle to reduce joint stress jarring. This can include weight loss. Also, exercise is needed to nourish the joint cartilage and remove waste. This helps keep the muscles   around the joint strong. HOME CARE INSTRUCTIONS   Do not take aspirin to relieve pain if gout is suspected. This elevates uric acid levels.  Only take over-the-counter or prescription medicines for pain, discomfort or fever as directed by your caregiver.  Rest the joint as much as  possible.  If your joint is swollen, keep it elevated.  Use crutches if the painful joint is in your leg.  Drinking plenty of fluids may help for certain types of arthritis.  Follow your caregiver's dietary instructions.  Try low-impact exercise such as:  Swimming.  Water aerobics.  Biking.  Walking.  Morning stiffness is often relieved by a warm shower.  Put your joints through regular range-of-motion. SEEK MEDICAL CARE IF:   You do not feel better in 24 hours or are getting worse.  You have side effects to medications, or are not getting better with treatment. SEEK IMMEDIATE MEDICAL CARE IF:   You have a fever.  You develop severe joint pain, swelling or redness.  Many joints are involved and become painful and swollen.  There is severe back pain and/or leg weakness.  You have loss of bowel or bladder control. Document Released: 05/23/2004 Document Revised: 07/08/2011 Document Reviewed: 06/08/2008 [facility] Patient Information 2015 [place], [place]. This information is not intended to replace advice given to you by your health care provider. Make sure you discuss any questions you have with your health care provider.  Bronchospasm A bronchospasm is when the tubes that carry air in and out of your lungs (airways) spasm or tighten. During a bronchospasm it is hard to breathe. This is because the airways get smaller. A bronchospasm can be triggered by:  Allergies. These may be to animals, pollen, food, or mold.  Infection. This is a common cause of bronchospasm.  Exercise.  Irritants. These include pollution, cigarette smoke, strong odors, aerosol sprays, and paint fumes.  Weather changes.  Stress.  Being emotional. HOME CARE   Always have a plan for getting help. Know when to call your doctor and local emergency services (911 in the [place] Know where you can get emergency care.  Only take medicines as told by your doctor.  If you were prescribed an  inhaler or nebulizer machine, ask your doctor how to use it correctly. Always use a spacer with your inhaler if you were given one.  Stay calm during an attack. Try to relax and breathe more slowly.  Control your home environment:  Change your heating and air conditioning filter at least once a month.  Limit your use of fireplaces and wood stoves.  Do not  smoke. Do not  allow smoking in your home.  Avoid perfumes and fragrances.  Get rid of pests (such as roaches and mice) and their droppings.  Throw away plants if you see mold on them.  Keep your house clean and dust free.  Replace carpet with wood, tile, or vinyl flooring. Carpet can trap dander and dust.  Use allergy-proof pillows, mattress covers, and box spring covers.  Wash bed sheets and blankets every week in hot water. Dry them in a dryer.  Use blankets that are made of polyester or cotton.  Wash hands frequently. GET HELP IF:  You have muscle aches.  You have chest pain.  The thick spit you spit or cough up (sputum) changes from clear or white to yellow, green, gray, or bloody.  The thick spit you spit or cough up gets thicker.  There are problems that may be related  to the medicine you are given such as:  A rash.  Itching.  Swelling.  Trouble breathing. GET HELP RIGHT AWAY IF:  You feel you cannot breathe or catch your breath.  You cannot stop coughing.  Your treatment is not helping you breathe better.  You have very bad chest pain. MAKE SURE YOU:   Understand these instructions.  Will watch your condition.  Will get help right away if you are not doing well or get worse. Document Released: 02/10/2009 Document Revised: 04/20/2013 Document Reviewed: 10/06/2012 [facility] Patient Information 2015 [place], [place]. This information is not intended to replace advice given to you by your health care provider. Make sure you discuss any questions you have with your health care  provider.  Musculoskeletal Pain Musculoskeletal pain is muscle and boney aches and pains. These pains can occur in any part of the body. Your caregiver may treat you without knowing the cause of the pain. They may treat you if blood or urine tests, X-rays, and other tests were normal.  CAUSES There is often not a definite cause or reason for these pains. These pains may be caused by a type of germ (virus). The discomfort may also come from overuse. Overuse includes working out too hard when your body is not fit. Boney aches also come from weather changes. Bone is sensitive to atmospheric pressure changes. HOME CARE INSTRUCTIONS   Ask when your test results will be ready. Make sure you get your test results.  Only take over-the-counter or prescription medicines for pain, discomfort, or fever as directed by your caregiver. If you were given medications for your condition, do not drive, operate machinery or power tools, or sign legal documents for 24 hours. Do not drink alcohol. Do not take sleeping pills or other medications that may interfere with treatment.  Continue all activities unless the activities cause more pain. When the pain lessens, slowly resume normal activities. Gradually increase the intensity and duration of the activities or exercise.  During periods of severe pain, bed rest may be helpful. Lay or sit in any position that is comfortable.  Putting ice on the injured area.  Put ice in a bag.  Place a towel between your skin and the bag.  Leave the ice on for 15 to 20 minutes, 3 to 4 times a day.  Follow up with your caregiver for continued problems and no reason can be found for the pain. If the pain becomes worse or does not go away, it may be necessary to repeat tests or do additional testing. Your caregiver may need to look further for a possible cause. SEEK IMMEDIATE MEDICAL CARE IF:  You have pain that is getting worse and is not relieved by medications.  You develop  chest pain that is associated with shortness or breath, sweating, feeling sick to your stomach (nauseous), or throw up (vomit).  Your pain becomes localized to the abdomen.  You develop any new symptoms that seem different or that concern you. MAKE SURE YOU:   Understand these instructions.  Will watch your condition.  Will get help right away if you are not doing well or get worse. Document Released: 04/15/2005 Document Revised: 07/08/2011 Document Reviewed: 12/18/2012 [facility] Patient Information 2015 [place], [place]. This information is not intended to replace advice given to you by your health care provider. Make sure you discuss any questions you have with your health care provider.  Upper Respiratory Infection, Adult An upper respiratory infection (URI) is also sometimes known as  the common cold. The upper respiratory tract includes the nose, sinuses, throat, trachea, and bronchi. Bronchi are the airways leading to the lungs. Most people improve within 1 week, but symptoms can last up to 2 weeks. A residual cough may last even longer.  CAUSES Many different viruses can infect the tissues lining the upper respiratory tract. The tissues become irritated and inflamed and often become very moist. Mucus production is also common. A cold is contagious. You can easily spread the virus to others by oral contact. This includes kissing, sharing a glass, coughing, or sneezing. Touching your mouth or nose and then touching a surface, which is then touched by another person, can also spread the virus. SYMPTOMS  Symptoms typically develop 1 to 3 days after you come in contact with a cold virus. Symptoms vary from person to person. They may include:  Runny nose.  Sneezing.  Nasal congestion.  Sinus irritation.  Sore throat.  Loss of voice (laryngitis).  Cough.  Fatigue.  Muscle aches.  Loss of appetite.  Headache.  Low-grade fever. DIAGNOSIS  You might diagnose your own cold  based on familiar symptoms, since most people get a cold 2 to 3 times a year. Your caregiver can confirm this based on your exam. Most importantly, your caregiver can check that your symptoms are not due to another disease such as strep throat, sinusitis, pneumonia, asthma, or epiglottitis. Blood tests, throat tests, and X-rays are not necessary to diagnose a common cold, but they may sometimes be helpful in excluding other more serious diseases. Your caregiver will decide if any further tests are required. RISKS AND COMPLICATIONS  You may be at risk for a more severe case of the common cold if you smoke cigarettes, have chronic heart disease (such as heart failure) or lung disease (such as asthma), or if you have a weakened immune system. The very young and very old are also at risk for more serious infections. Bacterial sinusitis, middle ear infections, and bacterial pneumonia can complicate the common cold. The common cold can worsen asthma and chronic obstructive pulmonary disease (COPD). Sometimes, these complications can require emergency medical care and may be life-threatening. PREVENTION  The best way to protect against getting a cold is to practice good hygiene. Avoid oral or hand contact with people with cold symptoms. Wash your hands often if contact occurs. There is no clear evidence that vitamin C, vitamin E, echinacea, or exercise reduces the chance of developing a cold. However, it is always recommended to get plenty of rest and practice good nutrition. TREATMENT  Treatment is directed at relieving symptoms. There is no cure. Antibiotics are not effective, because the infection is caused by a virus, not by bacteria. Treatment may include:  Increased fluid intake. Sports drinks offer valuable electrolytes, sugars, and fluids.  Breathing heated mist or steam (vaporizer or shower).  Eating chicken soup or other clear broths, and maintaining good nutrition.  Getting plenty of rest.  Using  gargles or lozenges for comfort.  Controlling fevers with ibuprofen or acetaminophen as directed by your caregiver.  Increasing usage of your inhaler if you have asthma. Zinc gel and zinc lozenges, taken in the first 24 hours of the common cold, can shorten the duration and lessen the severity of symptoms. Pain medicines may help with fever, muscle aches, and throat pain. A variety of non-prescription medicines are available to treat congestion and runny nose. Your caregiver can make recommendations and may suggest nasal or lung inhalers for other symptoms.  HOME CARE INSTRUCTIONS   Only take over-the-counter or prescription medicines for pain, discomfort, or fever as directed by your caregiver.  Use a warm mist humidifier or inhale steam from a shower to increase air moisture. This may keep secretions moist and make it easier to breathe.  Drink enough water and fluids to keep your urine clear or pale yellow.  Rest as needed.  Return to work when your temperature has returned to normal or as your caregiver advises. You may need to stay home longer to avoid infecting others. You can also use a face mask and careful hand washing to prevent spread of the virus. SEEK MEDICAL CARE IF:   After the first few days, you feel you are getting worse rather than better.  You need your caregiver's advice about medicines to control symptoms.  You develop chills, worsening shortness of breath, or brown or red sputum. These may be signs of pneumonia.  You develop yellow or brown nasal discharge or pain in the face, especially when you bend forward. These may be signs of sinusitis.  You develop a fever, swollen neck glands, pain with swallowing, or white areas in the back of your throat. These may be signs of strep throat. SEEK IMMEDIATE MEDICAL CARE IF:   You have a fever.  You develop severe or persistent headache, ear pain, sinus pain, or chest pain.  You develop wheezing, a prolonged cough, cough  up blood, or have a change in your usual mucus (if you have chronic lung disease).  You develop sore muscles or a stiff neck. Document Released: 10/09/2000 Document Revised: 07/08/2011 Document Reviewed: 07/21/2013 [facility] Patient Information 2015 [place], [place]. This information is not intended to replace advice given to you by your health care provider. Make sure you discuss any questions you have with your health care provider.

## 2014-10-31 NOTE — ED Notes (Signed)
Pt returned from xray

## 2014-10-31 NOTE — ED Provider Notes (Signed)
CSN: [id number]     Arrival date & time 10/31/14  2116 History   This chart was scribed for non-physician practitioner, Delsa Grana PA-C, working with Pattricia Boss, MD by Altamease Oiler, ED Scribe. This patient was seen in room TR04C/TR04C and the patient's care was started at 9:51PM.     Chief Complaint  Patient presents with  . Arm Pain  . Eye Pain   The history is provided by the patient. No language interpreter was used.   April [name] is a 44 y.o. female with PMHx of asthma, morbid obesity, HTN, and seasonal allergies who presents to the Emergency Department complaining of atraumatic right eye pain that she noticed this morning. The pain is constant and rated 8/10 in severity. She describes the pain as an irritation, some burning sensation, without drainage or vision changes.  When asking pt about possible episodes of straining, she admits to increased forceful, non-productive cough, associated with her asthma.  She was recently admitted for asthma exacerbation, and has recently been using her albuterol inhaler more than usual. She does not feel extremely SOB, but is wheezy.  She does not have any CP, she has not experienced any fever, sweats, chills.  She does feel generally fatigued.  She has not traveled recently.  She has not sick contacts.  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 The pain is from the shoulder to the elbow and described as aching. The pain is constant, but has some times that the pain increases. She rates the arm pain 10/10 in severity.  Aleve, Advil, and Tramadol provided insufficient pain relief PTA. Movement exacerbates the pain, restricted use of the arm slightly decreases pain, but the ache is still present.  She denies any trauma or repetitive use of her arm. Pt denies extremity numbness, tingling, or weakness.    Past Medical History  Diagnosis Date  . Acanthosis nigricans   . Menorrhagia   . Hypertension,  essential   . Insomnia   . Morbid obesity   . Helicobacter pylori (H. pylori) infection   . Sleep apnea     Sleep study 2008 - mild OSA, not enough events to titrate CPAP  . COPD (chronic obstructive pulmonary disease)     PFTs in 2002, FEV1/FVC 65, no post bronchodilater test done  . Asthma     Followed by Dr. Annamaria Boots (pulmonology); receives every other week omalizumab injections; has frequent exacerbations  . Depression   . GERD (gastroesophageal reflux disease)   . Tobacco user   . Obesity   . Shortness of breath   . Headache(784.0)   . Seasonal allergies    Past Surgical History  Procedure Laterality Date  . Tubal ligation  1996    bilateral  . Breast reduction surgery  09/2011   Family History  Problem Relation Age of Onset  . Asthma Daughter   . Cancer Paternal Aunt   . Asthma Maternal Grandmother   . Hypertension Mother    History  Substance Use Topics  . Smoking status: Former Smoker -- 18 years    Types: Cigarettes    Quit date: 09/15/2014  . Smokeless tobacco: Former Systems developer    Quit date: 09/12/2014  . Alcohol Use: No   OB History    No data available     Review of Systems    Allergies  Review of patient's allergies indicates no known allergies.  Home Medications   Prior to  Admission medications   Medication Sig Start Date End Date Taking? Authorizing Provider  albuterol (PROVENTIL HFA;VENTOLIN HFA) 108 (90 BASE) MCG/ACT inhaler Inhale 1-2 puffs into the lungs every 4 (four) hours as needed for wheezing or shortness of breath. 09/09/14   [name], MD  albuterol (PROVENTIL) (2.5 MG/3ML) 0.083% nebulizer solution Take 3 mLs (2.5 mg total) by nebulization every 4 (four) hours as needed for wheezing or shortness of breath. 06/09/13   Pollie Friar, MD  hydrochlorothiazide (MICROZIDE) 12.5 MG capsule Take 1 capsule (12.5 mg total) by mouth daily. 09/09/14 09/09/15  [name], MD  ibuprofen (MOTRIN IB) 200 MG tablet Take 4 tablets (800 mg total) by mouth  every 8 (eight) hours as needed for moderate pain. 12/08/13   Carly J Rivet, MD  ipratropium (ATROVENT) 0.02 % nebulizer solution Take 2.5 mLs (0.5 mg total) by nebulization 4 (four) times daily. 09/16/14   [name], MD  loratadine (CLARITIN) 10 MG tablet Take 1 tablet (10 mg total) by mouth daily. 09/16/14   [name], MD  mometasone-formoterol (DULERA) 200-5 MCG/ACT AERO Inhale 2 puffs into the lungs 2 (two) times daily. Rinse mouth after inhalation 09/09/14   [name], MD  omalizumab Arvid Right) 150 MG injection Inject 150 mg into the skin every 14 (fourteen) days.    Historical Provider, MD  pantoprazole (PROTONIX) 20 MG tablet Take 20 mg by mouth daily.    Historical Provider, MD  predniSONE (DELTASONE) 10 MG tablet Take 1 tablet (10 mg total) by mouth 2 (two) times daily with a meal. 10/24/14   Deneise Lever, MD  sertraline (ZOLOFT) 50 MG tablet Take 1 tablet (50 mg total) by mouth daily. 09/16/14   [name], MD  zolpidem (AMBIEN) 10 MG tablet Take 10 mg by mouth at bedtime as needed for sleep.    Historical Provider, MD   Triage Vitals: BP 165/86 mmHg  Pulse 78  Temp(Src) 99 F (37.2 C) (Oral)  Resp 16  Ht 5\' 5"  (1.651 m)  Wt 343 lb (155.584 kg)  BMI 57.08 kg/m2  SpO2 98% Physical Exam  Constitutional: She is oriented to person, place, and time. She appears well-developed and well-nourished. No distress.  Obese female, appears stated age   HENT:  Head: Normocephalic and atraumatic.  Right Ear: External ear normal.  Left Ear: External ear normal.  Nose: Nose normal.  Mouth/Throat: Oropharynx is clear and moist. No oropharyngeal exudate.  Eyes: Conjunctivae and EOM are normal. Pupils are equal, round, and reactive to light. Right eye exhibits no discharge. Left eye exhibits no discharge. No scleral icterus.  Right eye shows subconjunctival hemorrhage at lateral aspect, no exudate No scleral icterus  Neck: Normal range of motion. Neck supple. No JVD present. No tracheal  deviation present. No thyromegaly present.  Cardiovascular: Normal rate, regular rhythm, normal heart sounds and intact distal pulses.  Exam reveals no gallop and no friction rub.   No murmur heard. Pulmonary/Chest: Effort normal. No accessory muscle usage or stridor. Tachypnea noted. No respiratory distress. She has no decreased breath sounds. She has wheezes. She has no rhonchi. She has no rales. Chest wall is not dull to percussion. She exhibits no tenderness and no retraction.  Diffused, prolonged expiratory wheeze, heard in all lung fields. No crackles or rhonchi, frequent cough, pt is mildly tachypneac  Pt speaking in short sentences No retractions, no nasal flaring No cyanosis of mouth, lips or finger tips.   Abdominal: Soft. Bowel  sounds are normal. She exhibits no distension and no mass. There is no tenderness. There is no rebound and no guarding.  Musculoskeletal: Normal range of motion. She exhibits no edema or tenderness.  TTP over left AC joint, bicipital groove, and glenohumeral joint Normal flexion, extension, adduction, abduction, internal and external rotation of the left shoulder Negative Apley's scratch test Negative open can test Negative drop test FROM at the left elbow and wrist TTP at left trapezius over shoulder and left neck Normal sensation Normal strength NVI  Lymphadenopathy:    She has no cervical adenopathy.  Neurological: She is alert and oriented to person, place, and time. She has normal reflexes. No cranial nerve deficit. She exhibits normal muscle tone. Coordination normal.  Skin: Skin is warm, dry and intact. No rash noted. She is not diaphoretic. No cyanosis or erythema. No pallor. Nails show no clubbing.  Psychiatric: She has a normal mood and affect. Her behavior is normal. Judgment and thought content normal.  Nursing note and vitals reviewed.   ED Course  Procedures   DIAGNOSTIC STUDIES: Oxygen Saturation is 98% on RA, normal by my  interpretation.    COORDINATION OF CARE: 9:56 PM Discussed treatment plan which includes numbing the right eye for further examination,  left shoulder XR, a breathing treatment, prednisone, and toradol  with pt at bedside and pt agreed to plan.  11:42 PM I re-evaluated the patient she feels a little better. Her breathing has improved but there is still some wheezing present. She is requesting a refill on her albuterol inhaler. She has Dulera at home.    Labs Review Labs Reviewed - No data to display  Imaging Review Dg Chest 2 View  10/31/2014   CLINICAL DATA:  Left shoulder pain.  EXAM: CHEST  2 VIEW  COMPARISON:  09/15/2014  FINDINGS: Normal heart size and mediastinal contours. Chronic mild coarsening of lung markings. No acute infiltrate or edema. No effusion or pneumothorax. No acute osseous findings.  IMPRESSION: Chronic bronchitic markings.  No acute findings.   Electronically Signed   By: Monte Fantasia M.D.   On: 10/31/2014 23:02   Dg Shoulder Left  10/31/2014   CLINICAL DATA:  Left shoulder pain for the last 2 weeks.  EXAM: LEFT SHOULDER - 2+ VIEW  COMPARISON:  08/29/2008  FINDINGS: Os acromiale. The acromion is also downsloping and there are inferior spurs narrowing the subacromial space. Mild marginal spurring about the glenohumeral joint without narrowing. No evidence of acute fracture, dislocation. No soft tissue mineralization.  IMPRESSION: 1. No acute findings. 2. Os acromiale with spurs narrowing the subacromial space.   Electronically Signed   By: Monte Fantasia M.D.   On: 10/31/2014 23:01     EKG Interpretation None      MDM   Final diagnoses:  Shoulder pain  Wheeze   Pt presented with CC of painful red eye and shoulder pain Upon questioning of possible sources for subconjunctival hemorrhage, pt states she has coughed a lot.  Chart review shows recent admission for asthma Pt has diffused wheeze heard on auscultation -  Plan to get breathing tx, CXR, shoulder Xray and  eye exam  Pt xray shows bronchitic changes, but no infiltrate, neg PNA, pt wheeze improved with multiple breathing treatments, however still auscultated, pt able to speak in full sentences without tachypnea.  She was not diaphoretic, or in any acute distress. Pt did not tolerate eye exam and refused fluorescein eye exam, clinically appears to be a subconjunctival hemorrhage,  without any other concerning etiology, no vision change, no ciliary injection, no steamy cornea Left shoulder xray and exam consistent with muscle strain, possibly acute on chronic shoulder pain with joint narrowing.    Tx:  Asthma exacerbation with cough - will treat for bronchitis with increasing cough - levaquin, pt given an inhaler and Rx for prednisone, has dulera at home. Tx shoulder - NSAID, flexeril, pain med, ortho f/u  Pt did not want to stay for any further tx of asthma.  She wished to be D/C'd after getting inhaler.  Pt was instructed to adhere to her asthma medication and strict return precautions were given.  Pt verbalized understanding.  I personally performed the services described in this documentation, which was scribed in my presence. The recorded information has been reviewed and is accurate.      Delsa Grana, PA-C 11/06/14 2259  Pattricia Boss, MD 11/15/14 [phone]

## 2014-10-31 NOTE — ED Notes (Signed)
Pt. 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

## 2014-11-01 NOTE — ED Notes (Signed)
Pt verbalizes understanding of d/c instructions and denies any further needs at this time. 

## 2014-11-02 ENCOUNTER — Encounter (HOSPITAL_COMMUNITY): Payer: Self-pay | Admitting: Emergency Medicine

## 2014-11-02 DIAGNOSIS — Z7951 Long term (current) use of inhaled steroids: Secondary | ICD-10-CM | POA: Insufficient documentation

## 2014-11-02 DIAGNOSIS — M79602 Pain in left arm: Secondary | ICD-10-CM | POA: Insufficient documentation

## 2014-11-02 DIAGNOSIS — J45901 Unspecified asthma with (acute) exacerbation: Principal | ICD-10-CM | POA: Insufficient documentation

## 2014-11-02 DIAGNOSIS — J449 Chronic obstructive pulmonary disease, unspecified: Secondary | ICD-10-CM | POA: Insufficient documentation

## 2014-11-02 DIAGNOSIS — K219 Gastro-esophageal reflux disease without esophagitis: Secondary | ICD-10-CM | POA: Insufficient documentation

## 2014-11-02 DIAGNOSIS — F17210 Nicotine dependence, cigarettes, uncomplicated: Secondary | ICD-10-CM | POA: Insufficient documentation

## 2014-11-02 DIAGNOSIS — F419 Anxiety disorder, unspecified: Secondary | ICD-10-CM | POA: Insufficient documentation

## 2014-11-02 DIAGNOSIS — I10 Essential (primary) hypertension: Secondary | ICD-10-CM | POA: Insufficient documentation

## 2014-11-02 DIAGNOSIS — F329 Major depressive disorder, single episode, unspecified: Secondary | ICD-10-CM | POA: Insufficient documentation

## 2014-11-02 DIAGNOSIS — Z79899 Other long term (current) drug therapy: Secondary | ICD-10-CM | POA: Insufficient documentation

## 2014-11-02 DIAGNOSIS — G4733 Obstructive sleep apnea (adult) (pediatric): Secondary | ICD-10-CM | POA: Insufficient documentation

## 2014-11-02 DIAGNOSIS — Z6841 Body Mass Index (BMI) 40.0 and over, adult: Secondary | ICD-10-CM | POA: Insufficient documentation

## 2014-11-02 MED ORDER — ALBUTEROL SULFATE (2.5 MG/3ML) 0.083% IN NEBU
5.0000 mg | INHALATION_SOLUTION | Freq: Once | RESPIRATORY_TRACT | Status: AC
  Administered 2014-11-02: 5 mg via RESPIRATORY_TRACT
  Filled 2014-11-02: qty 6

## 2014-11-02 NOTE — ED Notes (Signed)
Pt. reports asthma with wheezing and dry cough unrelieved by inhaler onset this week , denies fever or chills.

## 2014-11-03 ENCOUNTER — Encounter (HOSPITAL_COMMUNITY): Payer: Self-pay | Admitting: General Practice

## 2014-11-03 ENCOUNTER — Emergency Department (HOSPITAL_COMMUNITY): Payer: Self-pay

## 2014-11-03 ENCOUNTER — Observation Stay (HOSPITAL_COMMUNITY)
Admission: EM | Admit: 2014-11-03 | Discharge: 2014-11-03 | Disposition: A | Discharge status: Home / Self Care | Payer: Self-pay | Attending: Internal Medicine | Admitting: Internal Medicine

## 2014-11-03 DIAGNOSIS — G4733 Obstructive sleep apnea (adult) (pediatric): Secondary | ICD-10-CM

## 2014-11-03 DIAGNOSIS — Z6841 Body Mass Index (BMI) 40.0 and over, adult: Secondary | ICD-10-CM

## 2014-11-03 DIAGNOSIS — F17201 Nicotine dependence, unspecified, in remission: Secondary | ICD-10-CM | POA: Insufficient documentation

## 2014-11-03 DIAGNOSIS — F418 Other specified anxiety disorders: Secondary | ICD-10-CM

## 2014-11-03 DIAGNOSIS — M79602 Pain in left arm: Secondary | ICD-10-CM

## 2014-11-03 DIAGNOSIS — J45901 Unspecified asthma with (acute) exacerbation: Secondary | ICD-10-CM

## 2014-11-03 DIAGNOSIS — K219 Gastro-esophageal reflux disease without esophagitis: Secondary | ICD-10-CM

## 2014-11-03 LAB — BASIC METABOLIC PANEL
ANION GAP: 8 (ref 5–15)
BUN: 11 mg/dL (ref 6–20)
CALCIUM: 8.6 mg/dL — AB (ref 8.9–10.3)
CO2: 26 mmol/L (ref 22–32)
CREATININE: 0.79 mg/dL (ref 0.44–1.00)
Chloride: 105 mmol/L (ref 101–111)
GFR calc Af Amer: >60 mL/min (ref >60)
GFR calc non Af Amer: >60 mL/min (ref >60)
GLUCOSE: 98 mg/dL (ref 65–99)
Potassium: 3.4 mmol/L — ABNORMAL LOW (ref 3.5–5.1)
Sodium: 139 mmol/L (ref 135–145)

## 2014-11-03 LAB — CBC WITH DIFFERENTIAL/PLATELET
Basophils Absolute: 0 10*3/uL (ref 0.0–0.1)
Basophils Relative: 0 % (ref 0–1)
EOS PCT: 2 % (ref 0–5)
Eosinophils Absolute: 0.3 10*3/uL (ref 0.0–0.7)
HCT: 36.8 % (ref 36.0–46.0)
HEMOGLOBIN: 12.1 g/dL (ref 12.0–15.0)
LYMPHS ABS: 3.9 10*3/uL (ref 0.7–4.0)
LYMPHS PCT: 24 % (ref 12–46)
MCH: 28.8 pg (ref 26.0–34.0)
MCHC: 32.9 g/dL (ref 30.0–36.0)
MCV: 87.6 fL (ref 78.0–100.0)
Monocytes Absolute: 1.3 10*3/uL — ABNORMAL HIGH (ref 0.1–1.0)
Monocytes Relative: 8 % (ref 3–12)
Neutro Abs: 11 10*3/uL — ABNORMAL HIGH (ref 1.7–7.7)
Neutrophils Relative %: 66 % (ref 43–77)
Platelets: 374 10*3/uL (ref 150–400)
RBC: 4.2 MIL/uL (ref 3.87–5.11)
RDW: 16 % — ABNORMAL HIGH (ref 11.5–15.5)
WBC: 16.6 10*3/uL — AB (ref 4.0–10.5)

## 2014-11-03 MED ORDER — ENOXAPARIN SODIUM 80 MG/0.8ML ~~LOC~~ SOLN
75.0000 mg | SUBCUTANEOUS | Status: DC
  Filled 2014-11-03: qty 0.8

## 2014-11-03 MED ORDER — HYDROCHLOROTHIAZIDE 12.5 MG PO CAPS
12.5000 mg | ORAL_CAPSULE | Freq: Every day | ORAL | Status: DC
  Administered 2014-11-03: 12.5 mg via ORAL
  Filled 2014-11-03: qty 1

## 2014-11-03 MED ORDER — NICOTINE 7 MG/24HR TD PT24
7.0000 mg | MEDICATED_PATCH | Freq: Every day | TRANSDERMAL | Status: DC
  Administered 2014-11-03: 7 mg via TRANSDERMAL
  Filled 2014-11-03 (×2): qty 1

## 2014-11-03 MED ORDER — METHYLPREDNISOLONE SODIUM SUCC 125 MG IJ SOLR
125.0000 mg | Freq: Once | INTRAMUSCULAR | Status: AC
  Administered 2014-11-03: 125 mg via INTRAVENOUS
  Filled 2014-11-03: qty 2

## 2014-11-03 MED ORDER — ALBUTEROL SULFATE HFA 108 (90 BASE) MCG/ACT IN AERS
2.0000 | INHALATION_SPRAY | RESPIRATORY_TRACT | Status: DC | PRN
  Administered 2014-11-03: 2 via RESPIRATORY_TRACT
  Filled 2014-11-03: qty 6.7

## 2014-11-03 MED ORDER — ALBUTEROL (5 MG/ML) CONTINUOUS INHALATION SOLN
15.0000 mg/h | INHALATION_SOLUTION | Freq: Once | RESPIRATORY_TRACT | Status: AC
  Administered 2014-11-03: 15 mg/h via RESPIRATORY_TRACT
  Filled 2014-11-03: qty 20

## 2014-11-03 MED ORDER — AEROCHAMBER PLUS FLO-VU MEDIUM MISC
1.0000 | Freq: Once | Status: AC
  Administered 2014-11-03: 1
  Filled 2014-11-03: qty 1

## 2014-11-03 MED ORDER — PANTOPRAZOLE SODIUM 20 MG PO TBEC: 20.0000 mg | DELAYED_RELEASE_TABLET | Freq: Every day | ORAL | Status: DC

## 2014-11-03 MED ORDER — MOMETASONE FURO-FORMOTEROL FUM 200-5 MCG/ACT IN AERO
2.0000 | INHALATION_SPRAY | Freq: Two times a day (BID) | RESPIRATORY_TRACT | Status: DC
  Administered 2014-11-03: 2 via RESPIRATORY_TRACT
  Filled 2014-11-03 (×2): qty 8.8

## 2014-11-03 MED ORDER — ALBUTEROL SULFATE (2.5 MG/3ML) 0.083% IN NEBU
2.5000 mg | INHALATION_SOLUTION | RESPIRATORY_TRACT | Status: DC
  Administered 2014-11-03: 2.5 mg via RESPIRATORY_TRACT
  Filled 2014-11-03: qty 3

## 2014-11-03 MED ORDER — MORPHINE SULFATE 4 MG/ML IJ SOLN
4.0000 mg | Freq: Once | INTRAMUSCULAR | Status: AC
  Administered 2014-11-03: 4 mg via INTRAVENOUS
  Filled 2014-11-03: qty 1

## 2014-11-03 MED ORDER — RANITIDINE HCL 150 MG PO TABS: 300.0000 mg | ORAL_TABLET | Freq: Every day | ORAL | Status: DC

## 2014-11-03 MED ORDER — ALBUTEROL SULFATE (2.5 MG/3ML) 0.083% IN NEBU: 2.5000 mg | INHALATION_SOLUTION | RESPIRATORY_TRACT | Status: DC

## 2014-11-03 MED ORDER — CYCLOBENZAPRINE HCL 10 MG PO TABS
10.0000 mg | ORAL_TABLET | Freq: Two times a day (BID) | ORAL | Status: DC | PRN
  Administered 2014-11-03: 10 mg via ORAL
  Filled 2014-11-03: qty 1

## 2014-11-03 MED ORDER — POTASSIUM CHLORIDE CRYS ER 20 MEQ PO TBCR
40.0000 meq | EXTENDED_RELEASE_TABLET | Freq: Once | ORAL | Status: AC
  Administered 2014-11-03: 40 meq via ORAL
  Filled 2014-11-03: qty 2

## 2014-11-03 MED ORDER — PANTOPRAZOLE SODIUM 40 MG PO TBEC
40.0000 mg | DELAYED_RELEASE_TABLET | Freq: Every day | ORAL | Status: DC
  Administered 2014-11-03: 40 mg via ORAL
  Filled 2014-11-03: qty 1

## 2014-11-03 MED ORDER — SERTRALINE HCL 50 MG PO TABS
50.0000 mg | ORAL_TABLET | Freq: Every day | ORAL | Status: DC
  Administered 2014-11-03: 50 mg via ORAL
  Filled 2014-11-03: qty 1

## 2014-11-03 MED ORDER — MAGNESIUM SULFATE IN D5W 10-5 MG/ML-% IV SOLN
1.0000 g | Freq: Once | INTRAVENOUS | Status: AC
  Administered 2014-11-03: 1 g via INTRAVENOUS
  Filled 2014-11-03: qty 100

## 2014-11-03 MED ORDER — IPRATROPIUM BROMIDE 0.02 % IN SOLN
0.5000 mg | Freq: Once | RESPIRATORY_TRACT | Status: AC
  Administered 2014-11-03: 0.5 mg via RESPIRATORY_TRACT
  Filled 2014-11-03: qty 2.5

## 2014-11-03 MED ORDER — LORATADINE 10 MG PO TABS
10.0000 mg | ORAL_TABLET | Freq: Every day | ORAL | Status: DC
  Administered 2014-11-03: 10 mg via ORAL
  Filled 2014-11-03: qty 1

## 2014-11-03 MED ORDER — PREDNISONE 50 MG PO TABS
60.0000 mg | ORAL_TABLET | Freq: Once | ORAL | Status: AC
  Administered 2014-11-03: 60 mg via ORAL
  Filled 2014-11-03 (×2): qty 1

## 2014-11-03 MED ORDER — ACETAMINOPHEN 325 MG PO TABS
650.0000 mg | ORAL_TABLET | Freq: Four times a day (QID) | ORAL | Status: DC | PRN
  Administered 2014-11-03: 650 mg via ORAL
  Filled 2014-11-03: qty 2

## 2014-11-03 NOTE — Care Management Note (Signed)
Case Management Note  Patient Details  Name: April [name] MRN: [id number] Date of Birth: 02-19-1971  Subjective/Objective:                    Action/Plan: [facility] Will discharge to home with [facility] and Albuterol MDI with spacer.  Expected Discharge Date:  11/06/14               Expected Discharge Plan:  Home/Self Care  In-House Referral:     Discharge planning Services  CM Consult, [facility]  Post Acute Care Choice:    Choice offered to:  Patient  DME Arranged:    DME Agency:     HH Arranged:    [place] Agency:     Status of Service:  Completed, signed off  Medicare Important Message Given:    Date Medicare IM Given:    Medicare IM give by:    Date Additional Medicare IM Given:    Additional Medicare Important Message give by:     If discussed at [place] of Stay Meetings, dates discussed:    Additional Comments:  Carles Collet, RN 11/03/2014, 11:22 AM

## 2014-11-03 NOTE — Hospital Discharge Follow-Up (Signed)
Transitional Care Clinic Care Coordination Note:     Discharge date: 11/03/14 Discharge Disposition: Home Patient contact: [phone] (cell) Emergency contact(s): Beckey Rutter (mother)-[phone]  This Case Manager reviewed patient's EMR and determined patient would benefit from post-discharge medical management and chronic care management services through the [facility]. Patient has a history of HTN, asthma, COPD, GERD, OSA, morbid obesity. This Case Manager met with patient to discuss the services and medical management that can be provided at the [facility]. Patient verbalized understanding and agreed to receive post-discharge care at the [facility].   Patient scheduled for Transitional Care appointment on 11/08/04 at 0900 with Dr. Jarold Song.  Clinic information and appointment time provided to patient. Appointment information also placed on AVS.  Assessment:       Home Environment: Patient lives alone in a private residence.       Support System: mother-Katherine [name]       Level of functioning: independent       Home DME: home nebulizer       Home care services: none       Transportation: Patient's friends or family members provide transportation to MD appointments.        Food/Nutrition: Patient indicates she does her own shopping and prepares her own meals. She denies having difficulty affording or accessing food.        Medications: Patient indicates she typically uses [facility] on [address] or [place] in [place] for medications. Patient is uninsured so thoroughly discussed pharmacy services available at [place] including [place]. Per Inpatient CM, patient to discharge with [facility] and Albuterol MDI. Per [facility] pharmacy tech, both medications available through [place] [facility].        Identified Barriers: uninsured, no PCP, lack of prescription coverage        PCP: none. Patient will  likely need to establish care with PCP at [facility] after Transitional Care Clinic follow-up.   Patient Education: Discussed pharmacy and financial counselor resources available at [facility] and [facility] [facility]. Also provided patient with Financial Counselor walk-in hours. Patient educated on the close follow-up provided by [facility]. Patient appreciative of information.            Arranged services:        Voicemail left for Carles Collet, RN CM updating on conversation with patient and that patient has [facility] appointment scheduled on 11/09/14 at 0900.

## 2014-11-03 NOTE — Progress Notes (Signed)
April [name] to be D/[name]'d Home per MD order.  Discussed with the patient and all questions fully answered.  VSS, Skin clean, dry and intact without evidence of skin break down, no evidence of skin tears noted. IV catheter discontinued intact. Site without signs and symptoms of complications. Dressing and pressure applied.  An After Visit Summary was printed and given to the patient. Patient received prescription. Both inhalers given to patient per MD order.   D/[name] education completed with patient/family including follow up instructions, medication list, d/[name] activities limitations if indicated, with other d/[name] instructions as indicated by MD - patient able to verbalize understanding, all questions fully answered.   Patient instructed to return to ED, call 911, or call MD for any changes in condition.   Patient escorted via [place], and D/[name] home via private auto.  [place], April [name] 11/03/2014 3:05 PM

## 2014-11-03 NOTE — ED Provider Notes (Signed)
CSN: [id number]     Arrival date & time 11/02/14  2343 History    This chart was scribed for Merryl Hacker, MD by Forrestine Him, ED Scribe. This patient was seen in room D34C/D34C and the patient's care was started 12:53 AM.   Chief Complaint  Patient presents with  . Asthma   The history is provided by the patient. No language interpreter was used.   HPI Comments: April [name] is a 44 y.o. female with a PMHx of asthma and COPD who presents to the Emergency Department here for ongoing asthma this evening. Pt reports constant, ongoing, progressively worsening shortness of breath, wheezing and dry cough. At home inhaler attempted prior to arrival without any improvement for symptoms. No recent fever, chills, nausea, vomiting, abdominal pain, or chest pain. Pt was evaluated earlier this week for same and was treated in department with improvement for symptoms. Taking prednisone at home.  She is not a smoker. PMHx also includes HTN. No known allergies to medications.  Past Medical History  Diagnosis Date  . Acanthosis nigricans   . Menorrhagia   . Hypertension, essential   . Insomnia   . Morbid obesity   . Helicobacter pylori (H. pylori) infection   . Sleep apnea     Sleep study 2008 - mild OSA, not enough events to titrate CPAP  . COPD (chronic obstructive pulmonary disease)     PFTs in 2002, FEV1/FVC 65, no post bronchodilater test done  . Asthma     Followed by Dr. Annamaria Boots (pulmonology); receives every other week omalizumab injections; has frequent exacerbations  . Depression   . GERD (gastroesophageal reflux disease)   . Tobacco user   . Obesity   . Shortness of breath   . Headache(784.0)   . Seasonal allergies    Past Surgical History  Procedure Laterality Date  . Tubal ligation  1996    bilateral  . Breast reduction surgery  09/2011   Family History  Problem Relation Age of Onset  . Asthma Daughter   . Cancer Paternal Aunt   . Asthma Maternal Grandmother   .  Hypertension Mother    History  Substance Use Topics  . Smoking status: Former Smoker -- 18 years    Types: Cigarettes    Quit date: 09/15/2014  . Smokeless tobacco: Former Systems developer    Quit date: 09/12/2014  . Alcohol Use: No   OB History    No data available     Review of Systems  Constitutional: Negative for fever and chills.  Respiratory: Positive for cough, shortness of breath and wheezing.   Cardiovascular: Negative for chest pain.  Gastrointestinal: Negative for nausea, vomiting and abdominal pain.  Neurological: Negative for dizziness, weakness and numbness.  Psychiatric/Behavioral: Negative for confusion.  All other systems reviewed and are negative.     Allergies  Review of patient's allergies indicates no known allergies.  Home Medications   Prior to Admission medications   Medication Sig Start Date End Date Taking? Authorizing Provider  albuterol (PROVENTIL HFA;VENTOLIN HFA) 108 (90 BASE) MCG/ACT inhaler Inhale 1-2 puffs into the lungs every 4 (four) hours as needed for wheezing or shortness of breath. 09/09/14  Yes [name], MD  albuterol (PROVENTIL) (2.5 MG/3ML) 0.083% nebulizer solution Take 3 mLs (2.5 mg total) by nebulization every 4 (four) hours as needed for wheezing or shortness of breath. 06/09/13  Yes Pollie Friar, MD  cyclobenzaprine (FLEXERIL) 10 MG tablet Take 1 tablet (10 mg total) by  mouth 2 (two) times daily as needed for muscle spasms. 10/31/14  Yes Delsa Grana, PA-C  hydrochlorothiazide (MICROZIDE) 12.5 MG capsule Take 1 capsule (12.5 mg total) by mouth daily. 09/09/14 09/09/15 Yes [name], MD  HYDROcodone-acetaminophen (NORCO/VICODIN) 5-325 MG per tablet Take 2 tablets by mouth every 4 (four) hours as needed. 10/31/14  Yes Delsa Grana, PA-C  ibuprofen (ADVIL,MOTRIN) 800 MG tablet Take 1 tablet (800 mg total) by mouth 3 (three) times daily. 10/31/14  Yes Delsa Grana, PA-C  ipratropium (ATROVENT) 0.02 % nebulizer solution Take 2.5 mLs (0.5 mg total) by  nebulization 4 (four) times daily. 09/16/14  Yes [name], MD  levofloxacin (LEVAQUIN) 750 MG tablet Take 1 tablet (750 mg total) by mouth daily. 10/31/14  Yes Delsa Grana, PA-C  loratadine (CLARITIN) 10 MG tablet Take 1 tablet (10 mg total) by mouth daily. 09/16/14  Yes [name], MD  mometasone-formoterol (DULERA) 200-5 MCG/ACT AERO Inhale 2 puffs into the lungs 2 (two) times daily. Rinse mouth after inhalation 09/09/14  Yes [name], MD  omalizumab Arvid Right) 150 MG injection Inject 150 mg into the skin every 14 (fourteen) days.   Yes Historical Provider, MD  pantoprazole (PROTONIX) 20 MG tablet Take 20 mg by mouth daily.   Yes Historical Provider, MD  predniSONE (DELTASONE) 20 MG tablet Take 2 tablets (40 mg total) by mouth daily. Take 40 mg by mouth daily for 3 days, then 20mg  by mouth daily for 3 days, then 10mg  daily for 3 days 10/31/14  Yes Delsa Grana, PA-C  sertraline (ZOLOFT) 50 MG tablet Take 1 tablet (50 mg total) by mouth daily. 09/16/14  Yes [name], MD  zolpidem (AMBIEN) 10 MG tablet Take 10 mg by mouth at bedtime as needed for sleep.   Yes Historical Provider, MD   Triage Vitals: BP 164/93 mmHg  Pulse 90  Temp(Src) 98.8 F (37.1 C) (Oral)  Resp 20  Ht 5\' 6"  (1.676 m)  Wt 342 lb 2.5 oz (155.2 kg)  BMI 55.25 kg/m2  SpO2 96%   Physical Exam  Constitutional: She is oriented to person, place, and time. She appears well-developed and well-nourished.  Mild tachypnea, no acute distress  HENT:  Head: Normocephalic and atraumatic.  Cardiovascular: Regular rhythm and normal heart sounds.   Tachycardia  Pulmonary/Chest: Effort normal. No respiratory distress. She has wheezes.  Poor air movement, wheezing noted, tight, mild tachypnea  Abdominal: Soft. Bowel sounds are normal. There is no tenderness. There is no rebound.  Musculoskeletal: She exhibits edema.  Neurological: She is alert and oriented to person, place, and time.  Skin: Skin is warm and dry.  Psychiatric: She  has a normal mood and affect.  Nursing note and vitals reviewed.   ED Course  Procedures (including critical care time)  CRITICAL CARE Performed by: Merryl Hacker   Total critical care time: 30 min  Critical care time was exclusive of separately billable procedures and treating other patients.  Critical care was necessary to treat or prevent imminent or life-threatening deterioration.  Critical care was time spent personally by me on the following activities: development of treatment plan with patient and/or surrogate as well as nursing, discussions with consultants, evaluation of patient's response to treatment, examination of patient, obtaining history from patient or surrogate, ordering and performing treatments and interventions, ordering and review of laboratory studies, ordering and review of radiographic studies, pulse oximetry and re-evaluation of patient's condition.  DIAGNOSTIC STUDIES: Oxygen Saturation is 99% on RA,  Normal by my interpretation.    COORDINATION OF CARE: 12:55 AM- Will give Proventil, Solu-Medrol, Atrovent nebulizer. Will order CXR, CBC, and BMP. Discussed treatment plan with pt at bedside and pt agreed to plan.     Labs Review Labs Reviewed  CBC WITH DIFFERENTIAL/PLATELET - Abnormal; Notable for the following:    WBC 16.6 (*)    RDW 16.0 (*)    Neutro Abs 11.0 (*)    Monocytes Absolute 1.3 (*)    All other components within normal limits  BASIC METABOLIC PANEL - Abnormal; Notable for the following:    Potassium 3.4 (*)    Calcium 8.6 (*)    All other components within normal limits    Imaging Review Dg Chest 2 View  11/03/2014   CLINICAL DATA:  Asthma.  Shortness of breath  EXAM: CHEST  2 VIEW  COMPARISON:  10/31/2014  FINDINGS: Lateral imaging is degraded by motion.  Exam is overall diagnostic.  Normal heart size and mediastinal contours. Stable mild bronchitic markings. No acute infiltrate or edema. No effusion or pneumothorax. No acute  osseous findings.  IMPRESSION: No acute cardiopulmonary disease.   Electronically Signed   By: Monte Fantasia M.D.   On: 11/03/2014 02:09     EKG Interpretation None      MDM   Final diagnoses:  Asthma, unspecified asthma severity, with acute exacerbation    Patient presents with wheezing and cough. Unrelieved with inhaler and prednisone at home. She is tight on initial evaluation and mildly to. No obvious respiratory distress. Patient was placed on continuous neb. She was given Solu-Medrol, magnesium. Basic labwork reassuring. Chest x-ray without evidence of pneumonia. On recheck, patient remains tachypneic but aeration is improved. She still has significant wheezing. As of this and failure of outpatient management, will admit for frequent duo nebs and observation.  After history, exam, and medical workup I feel the patient has been appropriately medically screened and is safe for discharge home. Pertinent diagnoses were discussed with the patient. Patient was given return precautions.   I personally performed the services described in this documentation, which was scribed in my presence. The recorded information has been reviewed and is accurate.   Merryl Hacker, MD 11/03/14 [phone]

## 2014-11-03 NOTE — H&P (Signed)
Date: 11/03/2014               Patient Name:  April [name] MRN: [id number]  DOB: 03-08-1971 Age / Sex: 44 y.o., female   PCP: [name], MD         Medical Service: Internal Medicine Teaching Service         Attending Physician: Dr. Oval Linsey, MD    First Contact: Dr. Benjamine Mola Pager: [phone]  Second Contact: Dr. Denton Brick Pager: [phone]       After Hours (After 5p/  First Contact Pager: [phone]  weekends / holidays): Second Contact Pager: [phone]   Chief Complaint: asthma exacerbation  History of Present Illness: April [name] is a 44 yo female with PMH of HTN, Asthma, GERD, OSA, and morbid obesity, presenting with 1 week history of shortness of breath.  Patient states that she has continually been short of breath and can her herself wheezing, especially at night, making it difficult to sleep.  She has been using her albuterol inhaler every hour, including twice a night when she awakens to urinate. She endorses compliance with her asthma medications and inhalers.  She was recently discharged in May 2016 on a prednisone taper.  She reports that she took Prednisone 40 mg yesterday, and is supposed to take 30 mg today.  Clinic notes from her pulmonologist questions her compliance with medications, including bi-weekly Xolair injections.  She endorses possible dust exposure at work.  She endorses regular NSAID use for arm pain sustained while at work.  She denies tobacco use, but admitted to smoking a cigarette two days ago upon further questioning. She also asked for a nicotine patch.  She does not regularly check her peak flow.  PFTs at her recent pulmonology visit demonstrate: FEV1/FVC of 72%. FEV1 47%. FVC 66%.  Post-albuterol spirometry was not performed.  She had a recent ED visit on 7/4 for left pain and shortness of breath.  She reports the arm pain is from an injury sustained while working "a while ago."  It was recommended at that time that she be admitted to treat her asthma, but she  refused.   She had a sleep study in 2008 that diagnosed OSA but did not recommend CPAP at that time.  She has a history of uncontrolled GERD because she cannot afford her prilosec.  She denies fever, chills, chest pain, or changes in bowel or bladder habits.   She has requested a regular diet.   Meds: Current Facility-Administered Medications  Medication Dose Route Frequency Provider Last Rate Last Dose  . enoxaparin (LOVENOX) injection 40 mg  40 mg Subcutaneous Q24H Tasrif Ahmed, MD      . loratadine (CLARITIN) tablet 10 mg  10 mg Oral Daily Tasrif Ahmed, MD      . mometasone-formoterol (DULERA) 200-5 MCG/ACT inhaler 2 puff  2 puff Inhalation BID Tasrif Ahmed, MD      . pantoprazole (PROTONIX) EC tablet 20 mg  20 mg Oral Daily Tasrif Ahmed, MD      . sertraline (ZOLOFT) tablet 50 mg  50 mg Oral Daily Tasrif Ahmed, MD        Allergies: Allergies as of 11/02/2014  . (No Known Allergies)   Past Medical History  Diagnosis Date  . Acanthosis nigricans   . Menorrhagia   . Hypertension, essential   . Insomnia   . Morbid obesity   . Helicobacter pylori (H. pylori) infection   . Sleep apnea     Sleep study 2008 -  mild OSA, not enough events to titrate CPAP  . COPD (chronic obstructive pulmonary disease)     PFTs in 2002, FEV1/FVC 65, no post bronchodilater test done  . Asthma     Followed by Dr. Annamaria Boots (pulmonology); receives every other week omalizumab injections; has frequent exacerbations  . Depression   . GERD (gastroesophageal reflux disease)   . Tobacco user   . Obesity   . Shortness of breath   . Headache(784.0)   . Seasonal allergies    Past Surgical History  Procedure Laterality Date  . Tubal ligation  1996    bilateral  . Breast reduction surgery  09/2011   Family History  Problem Relation Age of Onset  . Asthma Daughter   . Cancer Paternal Aunt   . Asthma Maternal Grandmother   . Hypertension Mother    History   Social History  . Marital Status: Single     Spouse Name: N/A  . Number of Children: 1  . 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 Smoking status: Former Smoker -- 18 years    Types: Cigarettes    Quit date: 09/15/2014  . Smokeless tobacco: Former Systems developer    Quit date: 09/12/2014  . Alcohol Use: No  . Drug Use: No  . Sexual Activity: Not on file   Other Topics Concern  . 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     Review of Systems: Pertinent items are noted in HPI.  Physical Exam: Blood pressure 162/79, pulse 91, temperature 99.4 F (37.4 C), resp. rate 22, height 5\' 7"  (1.702 m), weight 341 lb (154.677 kg), SpO2 98 %. Physical Exam  Constitutional: She is oriented to person, place, and time.  Morbidly obese, in NAD  HENT:  Head: Normocephalic and atraumatic.  Eyes: EOM are normal.  Neck: Normal range of motion. No tracheal deviation present.  Cardiovascular: Normal rate, regular rhythm and normal heart sounds.  Exam reveals no gallop and no friction rub.   No murmur heard. Pulmonary/Chest:  No respiratory distress.  Normal work of breathing. No use of accessory muscles or retractions.  Occasional soft wheezes in all lung zones.  No crackles   Abdominal: Soft. Bowel sounds are normal. There is no tenderness. There is no rebound and no guarding.  Large abdomen.  Musculoskeletal:  1+ peripheral edema to midshin bilaterally  Neurological: She is alert and oriented to person, place, and time.  Skin: Skin is warm and dry. No erythema.     Lab results: Basic Metabolic Panel:  Recent Labs  11/03/14 0126  NA 139  K 3.4*  CL 105  CO2 26  GLUCOSE 98  BUN 11  CREATININE 0.79  CALCIUM 8.6*   Liver Function Tests: No results for input(s): AST, ALT, ALKPHOS, BILITOT, PROT, ALBUMIN in the last 72 hours. No results  for input(s): LIPASE, AMYLASE in the last 72 hours. No results for input(s): AMMONIA in the last 72 hours. CBC:  Recent Labs  11/03/14 0126  WBC 16.6*  NEUTROABS 11.0*  HGB 12.1  HCT 36.8  MCV 87.6  PLT 374   Cardiac Enzymes: No results for input(s): CKTOTAL, CKMB, CKMBINDEX, TROPONINI in the last 72 hours. BNP: No results for input(s): PROBNP in the last 72 hours. D-Dimer: No  results for input(s): DDIMER in the last 72 hours. CBG: No results for input(s): GLUCAP in the last 72 hours. Hemoglobin A1C: No results for input(s): HGBA1C in the last 72 hours. Fasting Lipid Panel: No results for input(s): CHOL, HDL, LDLCALC, TRIG, CHOLHDL, LDLDIRECT in the last 72 hours. Thyroid Function Tests: No results for input(s): TSH, T4TOTAL, FREET4, T3FREE, THYROIDAB in the last 72 hours. Anemia Panel: No results for input(s): VITAMINB12, FOLATE, FERRITIN, TIBC, IRON, RETICCTPCT in the last 72 hours. Coagulation: No results for input(s): LABPROT, INR in the last 72 hours. Urine Drug Screen: Drugs of Abuse     Component Value Date/Time   LABOPIA NONE DETECTED 09/15/2014 1515   COCAINSCRNUR NONE DETECTED 09/15/2014 1515   LABBENZ NONE DETECTED 09/15/2014 1515   AMPHETMU NONE DETECTED 09/15/2014 1515   THCU NONE DETECTED 09/15/2014 1515   LABBARB NONE DETECTED 09/15/2014 1515    Alcohol Level: No results for input(s): ETH in the last 72 hours. Urinalysis: No results for input(s): COLORURINE, LABSPEC, PHURINE, GLUCOSEU, HGBUR, BILIRUBINUR, KETONESUR, PROTEINUR, UROBILINOGEN, NITRITE, LEUKOCYTESUR in the last 72 hours.  Invalid input(s): APPERANCEUR  Imaging results:  Dg Chest 2 View  11/03/2014   CLINICAL DATA:  Asthma.  Shortness of breath  EXAM: CHEST  2 VIEW  COMPARISON:  10/31/2014  FINDINGS: Lateral imaging is degraded by motion.  Exam is overall diagnostic.  Normal heart size and mediastinal contours. Stable mild bronchitic markings. No acute infiltrate or edema. No effusion or  pneumothorax. No acute osseous findings.  IMPRESSION: No acute cardiopulmonary disease.   Electronically Signed   By: Monte Fantasia M.D.   On: 11/03/2014 02:09    Assessment & Plan by Problem: Active Problems:   Asthma exacerbation   Asthma  April [name] is a 44 yo female with PMH of HTN, Asthma, GERD, OSA, and morbid obesity, presenting with 1 week history of shortness of breath.  Acute Asthma Exacerbation: Patient has long history of asthma exacerbations, triggered by multiple causes.  Current tobacco use, seasonal allergies, uncontrolled GERD, OSA, morbid obesity, and questionable medication compliance all likely contributing to worsening asthma/dyspnea.  She received one dose of Solu-medrol in the ED, so we will escalate her Prednisone taper.  - Prednisone 60 mg PO and taper - Albuterol nebs q4hours with pre/post peak flow  - Continue home Dulera 2 puffs BID - Continue home Claritin 10 mg daily  Morbid Obesity: Last PFTs demonstrate likely mixed obstructive/restrictive disease.  OSA/OHS are probably contributing to her night time awakenings and dyspnea.  Her last sleep study was 2008, showing mild OSA.  She understands that she needs to lose weight, but requested a regular diet because she needs to eat while she's taking prednisone. - Recommend repeat sleep study  HTN: Stable - Continue HCTZ  Current Tobacco Use: Patient counseled that tobacco likely her biggest trigger for exacerbation and that she should abstain. - Nicotine Patch  GERD:  - Protonix 40 mg daily  Anxiety/Depression:  - Sertraline 50 mg daily - Hold Ambien  Left Arm Pain:  - Continue Flexeril 10 mg BID PRN muscle spasm - Hold home Norco  OSA: Recommend repeat sleep study, as above.  DVT Ppx: Lovenox  Dispo: Disposition is deferred at this time, awaiting improvement of current medical problems. Anticipated discharge in approximately 1-2 day(s).   The patient does have a current PCP (Tasrif Ahmed, MD) and  does need an [facility] hospital follow-up appointment after discharge.  The patient does not have transportation limitations that hinder transportation  to clinic appointments.  Signed: Iline Oven, MD, PhD 11/03/2014, 4:46 AM

## 2014-11-03 NOTE — Progress Notes (Signed)
Pt to discharge with dulera and albuterol mdi labeled for discharge. She will follow up with [facility] and is being assessed for their transitional care program.

## 2014-11-04 ENCOUNTER — Telehealth: Payer: Self-pay

## 2014-11-04 NOTE — Discharge Summary (Signed)
Name: April [name] MRN: [id number] DOB: Dec 29, 1970 44 y.o. PCP: [name], MD  Date of Admission: 11/03/2014 12:42 AM Date of Discharge: 11/04/2014 Attending Physician: No att. providers found  Discharge Diagnosis:   Asthma exacerbation   Asthma   Tobacco abuse   GERD  Discharge Medications:   Medication List    STOP taking these medications        levofloxacin 750 MG tablet  Commonly known as:  LEVAQUIN     pantoprazole 20 MG tablet  Commonly known as:  PROTONIX      TAKE these medications        albuterol 108 (90 BASE) MCG/ACT inhaler  Commonly known as:  PROVENTIL HFA;VENTOLIN HFA  Inhale 1-2 puffs into the lungs every 4 (four) hours as needed for wheezing or shortness of breath.     cyclobenzaprine 10 MG tablet  Commonly known as:  FLEXERIL  Take 1 tablet (10 mg total) by mouth 2 (two) times daily as needed for muscle spasms.     hydrochlorothiazide 12.5 MG capsule  Commonly known as:  MICROZIDE  Take 1 capsule (12.5 mg total) by mouth daily.     HYDROcodone-acetaminophen 5-325 MG per tablet  Commonly known as:  NORCO/VICODIN  Take 2 tablets by mouth every 4 (four) hours as needed.     ibuprofen 800 MG tablet  Commonly known as:  ADVIL,MOTRIN  Take 1 tablet (800 mg total) by mouth 3 (three) times daily.     ipratropium 0.02 % nebulizer solution  Commonly known as:  ATROVENT  Take 2.5 mLs (0.5 mg total) by nebulization 4 (four) times daily.     loratadine 10 MG tablet  Commonly known as:  CLARITIN  Take 1 tablet (10 mg total) by mouth daily.     mometasone-formoterol 200-5 MCG/ACT Aero  Commonly known as:  DULERA  Inhale 2 puffs into the lungs 2 (two) times daily. Rinse mouth after inhalation     omalizumab 150 MG injection  Commonly known as:  XOLAIR  Inject 150 mg into the skin every 14 (fourteen) days.     predniSONE 20 MG tablet  Commonly known as:  DELTASONE  Take 2 tablets (40 mg total) by mouth daily. Take 40 mg by mouth daily for 3 days,  then 20mg  by mouth daily for 3 days, then 10mg  daily for 3 days     ranitidine 150 MG tablet  Commonly known as:  ZANTAC  Take 2 tablets (300 mg total) by mouth at bedtime.     sertraline 50 MG tablet  Commonly known as:  ZOLOFT  Take 1 tablet (50 mg total) by mouth daily.     zolpidem 10 MG tablet  Commonly known as:  AMBIEN  Take 10 mg by mouth at bedtime as needed for sleep.        Disposition and follow-up:   April [name] was discharged from [facility] in Good condition.  At the hospital follow up visit please address:  1.  Athsma exacerbation: Please make sure April [name]'s wheezing, shortness of breath have continued to improve after hospital discharge.  2.  GERD: It is most likely untreated GERD acted as an extrinsic cause of airway inflammation. April [name] had discontinued PPI use partly due to cost, she was recommended to start ranitidine 300mg  qhs from [facility] $4 list, please ask about her medication access and compliance.  3.  Tobacco cessation: cigarette smoking is an environmental trigger contributing to April [name]'s athsma.  She reports current infrequent use at time of this admission.  Follow-up Appointments:     Follow-up Information    Follow up with [facility]     On 11/09/2014.   Why:  Transitional Care Clinic appointment on 11/09/14 at 9:00 am with Dr. Jarold Song.   Contact information:   [address] [place] [address] [phone]      Discharge Instructions: Discharge Instructions    Diet - low sodium heart healthy    Complete by:  As directed      Increase activity slowly    Complete by:  As directed            Consultations:    Procedures Performed:  Dg Chest 2 View  11/03/2014   CLINICAL DATA:  Asthma.  Shortness of breath  EXAM: CHEST  2 VIEW  COMPARISON:  10/31/2014  FINDINGS: Lateral imaging is degraded by motion.  Exam is overall diagnostic.  Normal heart size and  mediastinal contours. Stable mild bronchitic markings. No acute infiltrate or edema. No effusion or pneumothorax. No acute osseous findings.  IMPRESSION: No acute cardiopulmonary disease.   Electronically Signed   By: Monte Fantasia M.D.   On: 11/03/2014 02:09   Dg Chest 2 View  10/31/2014   CLINICAL DATA:  Left shoulder pain.  EXAM: CHEST  2 VIEW  COMPARISON:  09/15/2014  FINDINGS: Normal heart size and mediastinal contours. Chronic mild coarsening of lung markings. No acute infiltrate or edema. No effusion or pneumothorax. No acute osseous findings.  IMPRESSION: Chronic bronchitic markings.  No acute findings.   Electronically Signed   By: Monte Fantasia M.D.   On: 10/31/2014 23:02   Dg Shoulder Left  10/31/2014   CLINICAL DATA:  Left shoulder pain for the last 2 weeks.  EXAM: LEFT SHOULDER - 2+ VIEW  COMPARISON:  08/29/2008  FINDINGS: Os acromiale. The acromion is also downsloping and there are inferior spurs narrowing the subacromial space. Mild marginal spurring about the glenohumeral joint without narrowing. No evidence of acute fracture, dislocation. No soft tissue mineralization.  IMPRESSION: 1. No acute findings. 2. Os acromiale with spurs narrowing the subacromial space.   Electronically Signed   By: Monte Fantasia M.D.   On: 10/31/2014 23:01    Admission HPI: April [name] is a 44 yo female with PMH of HTN, Asthma, GERD, OSA, and morbid obesity, presenting with 1 week history of shortness of breath. Patient states that she has continually been short of breath and can her herself wheezing, especially at night, making it difficult to sleep. She has been using her albuterol inhaler every hour, including twice a night when she awakens to urinate. She endorses compliance with her asthma medications and inhalers. She was recently discharged in May 2016 on a prednisone taper. She reports that she took Prednisone 40 mg yesterday, and is supposed to take 30 mg today. Clinic notes from her pulmonologist  questions her compliance with medications, including bi-weekly Xolair injections. She endorses possible dust exposure at work. She endorses regular NSAID use for arm pain sustained while at work. She denies tobacco use, but admitted to smoking a cigarette two days ago upon further questioning. She also asked for a nicotine patch. She does not regularly check her peak flow. PFTs at her recent pulmonology visit demonstrate: FEV1/FVC of 72%. FEV1 47%. FVC 66%. Post-albuterol spirometry was not performed.  She had a recent ED visit on 7/4 for left pain and shortness of breath.  She reports the arm pain is from an injury sustained while working "a while ago." It was recommended at that time that she be admitted to treat her asthma, but she refused.   She had a sleep study in 2008 that diagnosed OSA but did not recommend CPAP at that time. She has a history of uncontrolled GERD because she cannot afford her prilosec.  She denies fever, chills, chest pain, or changes in bowel or bladder habits.   She has requested a regular diet.   Hospital Course by problem list: Asthma exacerbation: April [name] was admitted early morning 11/03/14 with wheezing and SOB. She reported only slight improvement after bronchodilator treatment and solu-medrol burst. She improved greatly when evaluated later the same day, likely due to improving control of extrinsic factors including uncontrolled GERD, tobacco use. Later changed back to home steroid taper dose of 30mg  prednisone. Discharged from the hospital in good condition.  Tobacco use: Admitted to recent smoking in initial evaluation. Started on nicotine patch until discharge.  GERD: Given protonix during hospital admission.  Discharge Vitals:   BP 164/93 mmHg  Pulse 90  Temp(Src) 98.8 F (37.1 C) (Oral)  Resp 20  Ht 5\' 6"  (1.676 m)  Wt 155.2 kg (342 lb 2.5 oz)  BMI 55.25 kg/m2  SpO2 95%  Discharge Labs:  No results found. However, due to the size of the  patient record, not all encounters were searched. Please check Results Review for a complete set of results.  Signed: Collier Salina, MD 11/04/2014, 5:51 PM

## 2014-11-04 NOTE — Telephone Encounter (Signed)
Transitional Care Clinic Post-discharge Follow-Up Phone Call:  Date of Discharge: 11/03/14 Principal Discharge Diagnosis(es): Asthma exacerbation, GERD, Tobacco Abuse Post-discharge Communication: Received return call from patient and post-discharge follow-up phone call completed. Call Completed: Yes                    With Whom: Patient     Please check all that apply:  X  Patient is knowledgeable of his/her condition(s) and/or treatment. X  Patient is caring for self at home.  ? Patient is receiving assist at home from family and/or caregiver. Family and/or caregiver is knowledgeable of patient's condition(s) and/or treatment. ? Patient is receiving home health services. If so, name of agency.     Medication Reconciliation:  X  Medication list reviewed with patient. X Patient has not obtained all discharge medications. Patient indicates she has all medications except ranitidine (300 mg at bedtime).  Per chart review, script sent to [facility] on [address]. Encouraged patient to pick up medication today, and she verbalized understanding.  Medication list reviewed with patient; patient has all other medications. She indicates she was given Albuterol MDI and Dulera from hospital at discharge. Stressed importance of medication compliance, and patient verbalized understanding.   Activities of Daily Living:  X  Independent ? Needs assist  ? Total Care    Community resources in place for patient:  X  None  ? Home Health/Home DME ? Assisted Living ? Support Group          Patient Education: Patient indicates her breathing has improved.  Stressed importance of medication compliance and patient verbalized understanding.  Patient reminded of appointment on 11/09/14 at 0900. Patient aware of appointment and indicates she will have transportation to appointment. Patient also reminded of appointment on 11/07/14 at 1315 for Xolair injection at [facility]. Patient appreciative of  reminder.        Patient barriers: uninsured, no PCP. Patient has been informed of pharmacy resources available at [facility], and she has also been informed of financial counselor resources.  Financial Counselor walk-in hours were given to patient on 11/03/14. Patient will need to establish care with PCP at [facility] after [facility] follow-up.

## 2014-11-04 NOTE — Telephone Encounter (Signed)
Transitional Care Clinic Post-discharge Follow-Up Phone Call:  Date of Discharge: 11/03/14 Principal Discharge Diagnosis(es): Asthma Exacerbation, GERD, Tobacco abuse Post-discharge Communication: Attempt #1 to reach patient.  Call placed to patient's mobile [phone]); unable to reach patient. Voicemail left requesting return call.  Call also placed to patient's emergency contact, Beckey Rutter ([phone]. Voicemail left requesting return call. Call completed: no

## 2014-11-07 ENCOUNTER — Ambulatory Visit: Payer: Self-pay

## 2014-11-08 ENCOUNTER — Other Ambulatory Visit: Payer: Self-pay | Admitting: *Deleted

## 2014-11-08 ENCOUNTER — Telehealth: Payer: Self-pay

## 2014-11-08 DIAGNOSIS — J45901 Unspecified asthma with (acute) exacerbation: Secondary | ICD-10-CM

## 2014-11-08 MED ORDER — ALBUTEROL SULFATE HFA 108 (90 BASE) MCG/ACT IN AERS: 1.0000 | INHALATION_SPRAY | RESPIRATORY_TRACT | Status: DC | PRN

## 2014-11-08 NOTE — Telephone Encounter (Signed)
Called the patient to check on her status and confirm her appointment. She said that she will be at her appointment tomorrow at 0900, noting that she can drive herself and has a car. She said that she has all of her medications except she needs the albuterol  She noted that the albuterol is too expensive at [facility] and she can't wait until tomorrow when she sees Dr Jarold Song to get the medication.  This CM explained the pharmacy resources at [facility] and informed her that the pharmacy has resources to assist her if the medication is too expensive.  She agreed to have the albuterol filled at [facility]. Kizzie Bane, RN will order the medication for the patient as Dr Jarold Song has given approval for the order. The patient said that she would pick up the medication this afternoon and agreed to call the pharmacy to make sure it is ready before coming to the clinic.

## 2014-11-08 NOTE — Telephone Encounter (Signed)
Patient has appointment to see Dr. Jarold Song for the first time tomorrow and says she is out of her albuterol inhaler.  Verbal order received to refill inhaler per Dr. Jarold Song

## 2014-11-09 ENCOUNTER — Inpatient Hospital Stay: Payer: Self-pay | Admitting: Family Medicine

## 2014-11-09 ENCOUNTER — Telehealth: Payer: Self-pay

## 2014-11-09 NOTE — Telephone Encounter (Signed)
Called placed to the patient 3 [phone]  to check on her status and to remind her that she has not picked up her  albuterol inhaler.  She called the clinic earlier this morning and  rescheduled her appointment for today.  She  is now scheduled to see Dr Jarold Song on 11/14/14 @ 1030.  Voice mail message left requesting a call back to # [phone] or [phone].

## 2014-11-10 ENCOUNTER — Telehealth: Payer: Self-pay | Admitting: *Deleted

## 2014-11-10 NOTE — Telephone Encounter (Signed)
Patient called back to say she would be at her appointment on July 18th at 1030.  I gave her extensive directions on how to get here as well as the address to the clinic.  I asked if she had gotten her albuterol inhaler "I was doin laundry and I found an old one."

## 2014-11-10 NOTE — Telephone Encounter (Signed)
Left HIPAA compliant message for patient to return my call.  RN calling to remind patient about her [facility] appointment on November 14, 2014 at 1030 and also to remind her that she needs to pick up her albuterol inhaler.

## 2014-11-11 ENCOUNTER — Ambulatory Visit (INDEPENDENT_AMBULATORY_CARE_PROVIDER_SITE_OTHER): Payer: Self-pay

## 2014-11-11 DIAGNOSIS — J4520 Mild intermittent asthma, uncomplicated: Secondary | ICD-10-CM

## 2014-11-14 ENCOUNTER — Inpatient Hospital Stay: Payer: Self-pay | Admitting: Family Medicine

## 2014-11-14 ENCOUNTER — Telehealth: Payer: Self-pay

## 2014-11-14 MED ORDER — OMALIZUMAB 150 MG ~~LOC~~ SOLR
225.0000 mg | Freq: Once | SUBCUTANEOUS | Status: AC
  Administered 2014-11-11: 225 mg via SUBCUTANEOUS

## 2014-11-14 NOTE — Telephone Encounter (Signed)
The patient cancelled her appointment for today and it has been rescheduled for 11/18/14 @ 1445. Called the patient to check on her status. voice mail message left requesting a call back to # [phone] or [phone].

## 2014-11-16 ENCOUNTER — Ambulatory Visit: Payer: Self-pay | Admitting: Adult Health

## 2014-11-17 ENCOUNTER — Telehealth: Payer: Self-pay | Admitting: *Deleted

## 2014-11-17 NOTE — Telephone Encounter (Signed)
Patient was contacted and date of birth was verified.  She stated she would be at her appointment tomorrow.

## 2014-11-18 ENCOUNTER — Encounter: Payer: Self-pay | Admitting: Family Medicine

## 2014-11-18 ENCOUNTER — Encounter (HOSPITAL_BASED_OUTPATIENT_CLINIC_OR_DEPARTMENT_OTHER): Payer: Self-pay | Admitting: Clinical

## 2014-11-18 ENCOUNTER — Ambulatory Visit: Payer: Self-pay | Attending: Family Medicine | Admitting: Family Medicine

## 2014-11-18 VITALS — BP 141/105 | HR 101 | Temp 98.3°F | Ht 66.0 in | Wt 332.8 lb

## 2014-11-18 DIAGNOSIS — M546 Pain in thoracic spine: Secondary | ICD-10-CM | POA: Insufficient documentation

## 2014-11-18 DIAGNOSIS — I10 Essential (primary) hypertension: Secondary | ICD-10-CM | POA: Insufficient documentation

## 2014-11-18 DIAGNOSIS — M549 Dorsalgia, unspecified: Secondary | ICD-10-CM | POA: Insufficient documentation

## 2014-11-18 DIAGNOSIS — Z6841 Body Mass Index (BMI) 40.0 and over, adult: Secondary | ICD-10-CM | POA: Insufficient documentation

## 2014-11-18 DIAGNOSIS — E876 Hypokalemia: Secondary | ICD-10-CM | POA: Insufficient documentation

## 2014-11-18 DIAGNOSIS — J449 Chronic obstructive pulmonary disease, unspecified: Secondary | ICD-10-CM | POA: Insufficient documentation

## 2014-11-18 DIAGNOSIS — F330 Major depressive disorder, recurrent, mild: Secondary | ICD-10-CM | POA: Insufficient documentation

## 2014-11-18 MED ORDER — CYCLOBENZAPRINE HCL 10 MG PO TABS: 10.0000 mg | ORAL_TABLET | Freq: Two times a day (BID) | ORAL | Status: DC | PRN

## 2014-11-18 MED ORDER — IPRATROPIUM BROMIDE 0.02 % IN SOLN: 0.5000 mg | Freq: Four times a day (QID) | RESPIRATORY_TRACT | Status: DC | PRN

## 2014-11-18 MED ORDER — ALBUTEROL SULFATE (2.5 MG/3ML) 0.083% IN NEBU: 2.5000 mg | INHALATION_SOLUTION | Freq: Four times a day (QID) | RESPIRATORY_TRACT | Status: DC | PRN

## 2014-11-18 MED ORDER — LISINOPRIL-HYDROCHLOROTHIAZIDE 10-12.5 MG PO TABS: 1.0000 | ORAL_TABLET | Freq: Every day | ORAL | Status: DC

## 2014-11-18 MED ORDER — ALBUTEROL SULFATE HFA 108 (90 BASE) MCG/ACT IN AERS: 1.0000 | INHALATION_SPRAY | RESPIRATORY_TRACT | Status: DC | PRN

## 2014-11-18 MED ORDER — SERTRALINE HCL 50 MG PO TABS: 50.0000 mg | ORAL_TABLET | Freq: Every day | ORAL | Status: DC

## 2014-11-18 MED ORDER — ACETAMINOPHEN-CODEINE #3 300-30 MG PO TABS: 1.0000 | ORAL_TABLET | Freq: Three times a day (TID) | ORAL | Status: DC | PRN

## 2014-11-18 NOTE — Progress Notes (Signed)
Subjective:    Patient ID: April [name], female    DOB: 1971-04-14, 44 y.o.   MRN: [id number]  HPI April [name] is a 44 year old female with a history of Obstructive sleep apnea, GERD, Depression, Asthma who comes in today for a follow up on hospitalization at [facility] ED from 7/7- 11/04/14 in which she was managed for Asthma exacerbation due to sub optimal compliance with Asthma medications as well as uncontrolled GERD due to non compliance with PPI. She had presented with progressive shortness of breath and wheezing and was placed on bronchodilator treatments and Solumedrol. CXR showed no acute cardiopulmonary process.She was transitioned to a Prednisone taper after her symptoms improved and was subsequently discharged to follow up with her Pulmonologist.  Interval History: She states she still wheezes, coughs and is dyspneic on mild exertion and has run out of her albuterol MDI; complains Dulera does not help much and so she does not use it but she received a sample inhaler from her Pulmonologist which helped and she is unable to recall the name. Review of Dr Janee Morn note indicates it is Anoro Ellipta but when she is shown the picture, she disputes it. Gets Xolair shots at Dr Janee Morn office at [facility].  Has been taking Zoloft for the last 3 weeks with no improvement in symptoms of depression; she also has insomnia despite being on Ambien 5mg  which received form her Pulmonologist. She has OSA and is on a CPAP machine. She has low back pain and left knee pain; works as a Market researcher patients; Naproxen is not helping.  Past Medical History  Diagnosis Date  . Acanthosis nigricans   . Menorrhagia   . Hypertension, essential   . Insomnia   . Morbid obesity   . Helicobacter pylori (H. pylori) infection   . Sleep apnea     Sleep study 2008 - mild OSA, not enough events to titrate CPAP  . COPD (chronic obstructive pulmonary disease)     PFTs in 2002, FEV1/FVC 65, no post  bronchodilater test done  . Asthma     Followed by Dr. Annamaria Boots (pulmonology); receives every other week omalizumab injections; has frequent exacerbations  . Depression   . GERD (gastroesophageal reflux disease)   . Tobacco user   . Obesity   . Shortness of breath   . Headache(784.0)   . Seasonal allergies    Past Surgical History  Procedure Laterality Date  . Tubal ligation  1996    bilateral  . Breast reduction surgery  09/2011    History   Social History  . Marital Status: Single    Spouse Name: N/A  . Number of Children: 1  . 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 Smoking status: Former Smoker -- 18 years    Types: Cigarettes    Quit date: 09/15/2014  . Smokeless tobacco: Former Systems developer    Quit date: 09/12/2014  . Alcohol Use: No  . Drug Use: No  . Sexual Activity: Not on file   Other Topics Concern  . 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     No Known Allergies  Current Outpatient Prescriptions on File Prior to Visit  Medication Sig Dispense Refill  . HYDROcodone-acetaminophen (NORCO/VICODIN) 5-325  MG per tablet Take 2 tablets by mouth every 4 (four) hours as needed. 6 tablet 0  . ibuprofen (ADVIL,MOTRIN) 800 MG tablet Take 1 tablet (800 mg total) by mouth 3 (three) times daily. 21 tablet 0  . loratadine (CLARITIN) 10 MG tablet Take 1 tablet (10 mg total) by mouth daily. 30 tablet 2  . mometasone-formoterol (DULERA) 200-5 MCG/ACT AERO Inhale 2 puffs into the lungs 2 (two) times daily. Rinse mouth after inhalation 1 Inhaler prn  . omalizumab (XOLAIR) 150 MG injection Inject 150 mg into the skin every 14 (fourteen) days.    . predniSONE (DELTASONE) 20 MG tablet Take 2 tablets (40 mg total) by mouth daily. Take 40 mg by mouth daily for 3 days, then 20mg  by mouth daily  for 3 days, then 10mg  daily for 3 days 12 tablet 0  . ranitidine (ZANTAC) 150 MG tablet Take 2 tablets (300 mg total) by mouth at bedtime. 180 tablet 3  . zolpidem (AMBIEN) 10 MG tablet Take 10 mg by mouth at bedtime as needed for sleep.     No current facility-administered medications on file prior to visit.     Review of Systems  General: negative for fever, weight loss, appetite change Eyes: no visual symptoms. ENT: no ear symptoms, no sinus tenderness, no nasal congestion or sore throat. Neck: no pain  Respiratory: see hpi Cardiovascular: no chest pain, + dyspnea on exertion, no pedal edema, no orthopnea. Gastrointestinal: no abdominal pain, no diarrhea, no constipation Genito-Urinary: no urinary frequency, no dysuria, no polyuria. Hematologic: no bruising Endocrine: no cold or heat intolerance Neurological: no headaches, no seizures, no tremors Musculoskeletal: no joint pains, no joint swelling Skin: no pruritus, no rash. Psychological: + depression, no anxiety, no suicidal ideation.      Objective: Filed Vitals:   11/18/14 1431  BP: 141/105  Pulse: 101  Temp: 98.3 F (36.8 C)  Height: 5\' 6"  (1.676 m)  Weight: 332 lb 12.8 oz (150.957 kg)  SpO2: 93%      Physical Exam Constitutional: morbidly obese  Eyes: PERRLA HEENT: Head is atraumatic, normal sinuses, normal oropharynx, normal appearing tonsils and palate, tympanic membrane is normal bilaterally. Neck: normal range of motion, no thyromegaly, no JVD Cardiovascular: tacycardic, normal rhythm, normal heart sounds, no murmurs, rub or gallop, no pedal edema Respiratory: clear to auscultation bilaterally, no wheezes, no rales, no rhonchi Abdomen: soft, not tender to palpation, normal bowel sounds, no enlarged organs Extremities: Full ROM, no tenderness in joints Skin: warm and dry, no lesions. Neurological: alert, oriented x3, cranial nerves I-XII grossly intact , normal motor strength, normal  sensation. Psychological: normal mood.        Assessment & Plan:  44 year old female with a history of HTN, Obstructive sleep apnea, GERD, Depression, Asthma with persisting symptoms of Asthma exacerbations secondary to running out of medications.  Asthma exacerbation: Chest is clear on exam but oxygen saturation is 93% Refilled MDI and she has been advised to obtain the name of the sample MDI that helped her so it can be refilled Keep appointment with Pulmonologist.  Depression: Oxly called in for therapy session. Will see back at her next visit and evaluate for the need for a possible increase in dose of Zoloft.  Hypokalemia: Will switch from HCTZ to Lisinopril/HCTZ   HTN: Controlled  Thoracic back pain: Discussed proper lifting techniques, application of heat. Refilled muscle relaxant, placed on Tylenol #3.  Obesity: Discussed reduction of portion sizes, increasing activity.

## 2014-11-18 NOTE — Patient Instructions (Signed)
Back Pain, Adult Low back pain is very common. About 1 in 5 people have back pain.The cause of low back pain is rarely dangerous. The pain often gets better over time.About half of people with a sudden onset of back pain feel better in just 2 weeks. About 8 in 10 people feel better by 6 weeks.  CAUSES Some common causes of back pain include:  Strain of the muscles or ligaments supporting the spine.  Wear and tear (degeneration) of the spinal discs.  Arthritis.  Direct injury to the back. DIAGNOSIS Most of the time, the direct cause of low back pain is not known.However, back pain can be treated effectively even when the exact cause of the pain is unknown.Answering your caregiver's questions about your overall health and symptoms is one of the most accurate ways to make sure the cause of your pain is not dangerous. If your caregiver needs more information, he or she may order lab work or imaging tests (X-rays or MRIs).However, even if imaging tests show changes in your back, this usually does not require surgery. HOME CARE INSTRUCTIONS For many people, back pain returns.Since low back pain is rarely dangerous, it is often a condition that people can learn to manageon their own.   Remain active. It is stressful on the back to sit or stand in one place. Do not sit, drive, or stand in one place for more than 30 minutes at a time. Take short walks on level surfaces as soon as pain allows.Try to increase the length of time you walk each day.  Do not stay in bed.Resting more than 1 or 2 days can delay your recovery.  Do not avoid exercise or work.Your body is made to move.It is not dangerous to be active, even though your back may hurt.Your back will likely heal faster if you return to being active before your pain is gone.  Pay attention to your body when you bend and lift. Many people have less discomfortwhen lifting if they bend their knees, keep the load close to their bodies,and  avoid twisting. Often, the most comfortable positions are those that put less stress on your recovering back.  Find a comfortable position to sleep. Use a firm mattress and lie on your side with your knees slightly bent. If you lie on your back, put a pillow under your knees.  Only take over-the-counter or prescription medicines as directed by your caregiver. Over-the-counter medicines to reduce pain and inflammation are often the most helpful.Your caregiver may prescribe muscle relaxant drugs.These medicines help dull your pain so you can more quickly return to your normal activities and healthy exercise.  Put ice on the injured area.  Put ice in a plastic bag.  Place a towel between your skin and the bag.  Leave the ice on for 15-20 minutes, 03-04 times a day for the first 2 to 3 days. After that, ice and heat may be alternated to reduce pain and spasms.  Ask your caregiver about trying back exercises and gentle massage. This may be of some benefit.  Avoid feeling anxious or stressed.Stress increases muscle tension and can worsen back pain.It is important to recognize when you are anxious or stressed and learn ways to manage it.Exercise is a great option. SEEK MEDICAL CARE IF:  You have pain that is not relieved with rest or medicine.  You have pain that does not improve in 1 week.  You have new symptoms.  You are generally not feeling well. SEEK   IMMEDIATE MEDICAL CARE IF:   You have pain that radiates from your back into your legs.  You develop new bowel or bladder control problems.  You have unusual weakness or numbness in your arms or legs.  You develop nausea or vomiting.  You develop abdominal pain.  You feel faint. Document Released: 04/15/2005 Document Revised: 10/15/2011 Document Reviewed: 08/17/2013 [employer] Patient Information 2015 [employer]. This information is not intended to replace advice given to you by your health care provider. Make sure you  discuss any questions you have with your health care provider.  

## 2014-11-18 NOTE — Progress Notes (Signed)
ASSESSMENT: Pt currently experiencing symptoms of depression. She needs to f/u with PCP; would benefit from f/u with [facility], along with psychoeducation and supportive counseling regarding coping with symptoms of depression.  Stage of Change: contemplative  PLAN: 1. F/U with behavioral health consultant in as needed 2. Psychiatric Medications: Zoloft, Ambien. 3. Behavioral recommendation(s):   -Consider walking daily, starting at 5-10 minutes/day -Consider reading over educational material regarding coping with symptoms of depression  SUBJECTIVE: Pt. referred by Dr Jarold Song for depression:  Pt. reports the following symptoms/concerns: Pt states that she used to be more physically active, and that she wants to walk outside, but often decides not to walk because she is concerned about what others will say Duration of problem: increased at least past month Severity: mild  OBJECTIVE: Orientation & Cognition: Oriented x3. Thought processes normal and appropriate to situation. Mood: appropriate. Affect: appropriate Appearance: appropriate Risk of harm to self or others: no risk of harm to self or others Substance use: none Assessments administered: PHQ9: 17/ GAD7: 16  Diagnosis: Major depressive disorder, recurrent episode, mild CPT Code: F33.0 -------------------------------------------- Other(s) present in the room: none  Time spent with patient in exam room: 20 minutes

## 2014-11-18 NOTE — Progress Notes (Signed)
Hospital follow up for asthma Patient needs refills on all medications and has been out for 3 days She states she has chronic back pain and had a breast reduction-does not recall the date

## 2014-11-20 ENCOUNTER — Encounter: Payer: Self-pay | Admitting: Family Medicine

## 2014-11-21 ENCOUNTER — Emergency Department (HOSPITAL_COMMUNITY)
Admission: EM | Admit: 2014-11-21 | Discharge: 2014-11-21 | Disposition: A | Discharge status: Home / Self Care | Payer: Self-pay | Attending: Emergency Medicine | Admitting: Emergency Medicine

## 2014-11-21 ENCOUNTER — Encounter (HOSPITAL_COMMUNITY): Payer: Self-pay | Admitting: Emergency Medicine

## 2014-11-21 ENCOUNTER — Emergency Department (HOSPITAL_COMMUNITY): Payer: Self-pay

## 2014-11-21 ENCOUNTER — Telehealth: Payer: Self-pay | Admitting: Internal Medicine

## 2014-11-21 DIAGNOSIS — J45901 Unspecified asthma with (acute) exacerbation: Secondary | ICD-10-CM

## 2014-11-21 DIAGNOSIS — Z8742 Personal history of other diseases of the female genital tract: Secondary | ICD-10-CM | POA: Insufficient documentation

## 2014-11-21 DIAGNOSIS — K219 Gastro-esophageal reflux disease without esophagitis: Secondary | ICD-10-CM | POA: Insufficient documentation

## 2014-11-21 DIAGNOSIS — M545 Low back pain: Secondary | ICD-10-CM | POA: Insufficient documentation

## 2014-11-21 DIAGNOSIS — Z8619 Personal history of other infectious and parasitic diseases: Secondary | ICD-10-CM | POA: Insufficient documentation

## 2014-11-21 DIAGNOSIS — F329 Major depressive disorder, single episode, unspecified: Secondary | ICD-10-CM | POA: Insufficient documentation

## 2014-11-21 DIAGNOSIS — Z7952 Long term (current) use of systemic steroids: Secondary | ICD-10-CM | POA: Insufficient documentation

## 2014-11-21 DIAGNOSIS — M79605 Pain in left leg: Secondary | ICD-10-CM | POA: Insufficient documentation

## 2014-11-21 DIAGNOSIS — I10 Essential (primary) hypertension: Secondary | ICD-10-CM | POA: Insufficient documentation

## 2014-11-21 DIAGNOSIS — Z87891 Personal history of nicotine dependence: Secondary | ICD-10-CM | POA: Insufficient documentation

## 2014-11-21 DIAGNOSIS — J441 Chronic obstructive pulmonary disease with (acute) exacerbation: Secondary | ICD-10-CM | POA: Insufficient documentation

## 2014-11-21 DIAGNOSIS — Z79899 Other long term (current) drug therapy: Secondary | ICD-10-CM | POA: Insufficient documentation

## 2014-11-21 DIAGNOSIS — G4700 Insomnia, unspecified: Secondary | ICD-10-CM | POA: Insufficient documentation

## 2014-11-21 LAB — URINALYSIS, ROUTINE W REFLEX MICROSCOPIC
Bilirubin Urine: NEGATIVE
Glucose, UA: NEGATIVE mg/dL
KETONES UR: NEGATIVE mg/dL
LEUKOCYTES UA: NEGATIVE
Nitrite: NEGATIVE
Protein, ur: NEGATIVE mg/dL
Specific Gravity, Urine: 1.024 (ref 1.005–1.030)
Urobilinogen, UA: 1 mg/dL (ref 0.0–1.0)
pH: 6 (ref 5.0–8.0)

## 2014-11-21 LAB — COMPREHENSIVE METABOLIC PANEL
ALBUMIN: 3.2 g/dL — AB (ref 3.5–5.0)
ALT: 22 U/L (ref 14–54)
AST: 26 U/L (ref 15–41)
Alkaline Phosphatase: 60 U/L (ref 38–126)
Anion gap: 11 (ref 5–15)
BILIRUBIN TOTAL: 0.5 mg/dL (ref 0.3–1.2)
BUN: 8 mg/dL (ref 6–20)
CHLORIDE: 105 mmol/L (ref 101–111)
CO2: 23 mmol/L (ref 22–32)
Calcium: 8 mg/dL — ABNORMAL LOW (ref 8.9–10.3)
Creatinine, Ser: 0.76 mg/dL (ref 0.44–1.00)
GFR calc Af Amer: >60 mL/min (ref >60)
GFR calc non Af Amer: >60 mL/min (ref >60)
Glucose, Bld: 99 mg/dL (ref 65–99)
POTASSIUM: 3.3 mmol/L — AB (ref 3.5–5.1)
Sodium: 139 mmol/L (ref 135–145)
TOTAL PROTEIN: 6.5 g/dL (ref 6.5–8.1)

## 2014-11-21 LAB — CBC
HEMATOCRIT: 38.5 % (ref 36.0–46.0)
HEMOGLOBIN: 12.5 g/dL (ref 12.0–15.0)
MCH: 28.6 pg (ref 26.0–34.0)
MCHC: 32.5 g/dL (ref 30.0–36.0)
MCV: 88.1 fL (ref 78.0–100.0)
PLATELETS: 355 10*3/uL (ref 150–400)
RBC: 4.37 MIL/uL (ref 3.87–5.11)
RDW: 15.9 % — ABNORMAL HIGH (ref 11.5–15.5)
WBC: 16.5 10*3/uL — ABNORMAL HIGH (ref 4.0–10.5)

## 2014-11-21 LAB — I-STAT TROPONIN, ED: TROPONIN I, POC: 0 ng/mL (ref 0.00–0.08)

## 2014-11-21 LAB — URINE MICROSCOPIC-ADD ON

## 2014-11-21 MED ORDER — MAGNESIUM SULFATE 2 GM/50ML IV SOLN
2.0000 g | Freq: Once | INTRAVENOUS | Status: AC
  Administered 2014-11-21: 2 g via INTRAVENOUS
  Filled 2014-11-21: qty 50

## 2014-11-21 MED ORDER — IPRATROPIUM-ALBUTEROL 0.5-2.5 (3) MG/3ML IN SOLN
3.0000 mL | Freq: Once | RESPIRATORY_TRACT | Status: AC
  Administered 2014-11-21: 3 mL via RESPIRATORY_TRACT
  Filled 2014-11-21: qty 3

## 2014-11-21 MED ORDER — ALBUTEROL SULFATE HFA 108 (90 BASE) MCG/ACT IN AERS
2.0000 | INHALATION_SPRAY | Freq: Once | RESPIRATORY_TRACT | Status: AC
  Administered 2014-11-21: 2 via RESPIRATORY_TRACT
  Filled 2014-11-21: qty 6.7

## 2014-11-21 MED ORDER — DIAZEPAM 5 MG PO TABS
5.0000 mg | ORAL_TABLET | Freq: Once | ORAL | Status: AC
  Administered 2014-11-21: 5 mg via ORAL
  Filled 2014-11-21: qty 1

## 2014-11-21 MED ORDER — PREDNISONE 50 MG PO TABS: 50.0000 mg | ORAL_TABLET | Freq: Every day | ORAL | Status: DC

## 2014-11-21 MED ORDER — HYDROCODONE-ACETAMINOPHEN 5-325 MG PO TABS
1.0000 | ORAL_TABLET | Freq: Once | ORAL | Status: AC
  Administered 2014-11-21: 1 via ORAL
  Filled 2014-11-21: qty 1

## 2014-11-21 MED ORDER — METHYLPREDNISOLONE SODIUM SUCC 125 MG IJ SOLR
125.0000 mg | Freq: Once | INTRAMUSCULAR | Status: AC
  Administered 2014-11-21: 125 mg via INTRAVENOUS
  Filled 2014-11-21: qty 2

## 2014-11-21 NOTE — ED Notes (Signed)
Pt back from X-ray.  

## 2014-11-21 NOTE — ED Provider Notes (Signed)
CSN: [id number]     Arrival date & time 11/21/14  0507 History   First MD Initiated Contact with Patient 11/21/14 [phone]     Chief Complaint  Patient presents with  . Shortness of Breath  . Back Pain  . Leg Pain     (Consider location/radiation/quality/duration/timing/severity/associated sxs/prior Treatment) HPI April [name] is a 44 y.o. female with history of asthma, hypertension, presents to emergency department complaining of shortness of breath, back pain, leg pain. Patient states that she has frequent asthma exacerbations. Was recently admitted 2 weeks ago for the same. States she has been having increased shortness of breath the last 3 days. States the last 24 hours she has continuously been using her albuterol and doing breathing treatments at home but the relief of her symptoms. Patient admits to some chest tightness. She denies any cough. No fever, chills. No upper respiratory complaints.  Past Medical History  Diagnosis Date  . Acanthosis nigricans   . Menorrhagia   . Hypertension, essential   . Insomnia   . Morbid obesity   . Helicobacter pylori (H. pylori) infection   . Sleep apnea     Sleep study 2008 - mild OSA, not enough events to titrate CPAP  . COPD (chronic obstructive pulmonary disease)     PFTs in 2002, FEV1/FVC 65, no post bronchodilater test done  . Asthma     Followed by Dr. Annamaria Boots (pulmonology); receives every other week omalizumab injections; has frequent exacerbations  . Depression   . GERD (gastroesophageal reflux disease)   . Tobacco user   . Obesity   . Shortness of breath   . Headache(784.0)   . Seasonal allergies    Past Surgical History  Procedure Laterality Date  . Tubal ligation  1996    bilateral  . Breast reduction surgery  09/2011   Family History  Problem Relation Age of Onset  . Asthma Daughter   . Cancer Paternal Aunt   . Asthma Maternal Grandmother   . Hypertension Mother    History  Substance Use Topics  . Smoking status:  Former Smoker -- 18 years    Types: Cigarettes    Quit date: 09/15/2014  . Smokeless tobacco: Former Systems developer    Quit date: 09/12/2014  . Alcohol Use: No   OB History    No data available     Review of Systems  Constitutional: Negative for fever and chills.  Respiratory: Positive for shortness of breath and wheezing. Negative for cough and chest tightness.   Cardiovascular: Negative for chest pain, palpitations and leg swelling.  Gastrointestinal: Negative for nausea, vomiting, abdominal pain and diarrhea.  Genitourinary: Negative for dysuria, flank pain, vaginal bleeding, vaginal discharge, vaginal pain and pelvic pain.  Musculoskeletal: Negative for myalgias, arthralgias, neck pain and neck stiffness.  Skin: Negative for rash.  Neurological: Negative for dizziness, weakness and headaches.  All other systems reviewed and are negative.     Allergies  Review of patient's allergies indicates no known allergies.  Home Medications   Prior to Admission medications   Medication Sig Start Date End Date Taking? Authorizing Provider  omalizumab Arvid Right) 150 MG injection Inject 150 mg into the skin every 14 (fourteen) days.   Yes Historical Provider, MD  zolpidem (AMBIEN) 10 MG tablet Take 10 mg by mouth at bedtime as needed for sleep.   Yes Historical Provider, MD  acetaminophen-codeine (TYLENOL #3) 300-30 MG per tablet Take 1 tablet by mouth every 8 (eight) hours as needed for moderate pain.  Patient not taking: Reported on 11/21/2014 11/18/14   Arnoldo Morale, MD  albuterol (PROVENTIL HFA;VENTOLIN HFA) 108 (90 BASE) MCG/ACT inhaler Inhale 1-2 puffs into the lungs every 4 (four) hours as needed for wheezing or shortness of breath. Patient not taking: Reported on 11/21/2014 11/18/14   Arnoldo Morale, MD  albuterol (PROVENTIL) (2.5 MG/3ML) 0.083% nebulizer solution Take 3 mLs (2.5 mg total) by nebulization every 6 (six) hours as needed for wheezing or shortness of breath. Patient not taking: Reported  on 11/21/2014 11/18/14   Arnoldo Morale, MD  cyclobenzaprine (FLEXERIL) 10 MG tablet Take 1 tablet (10 mg total) by mouth 2 (two) times daily as needed for muscle spasms. Patient not taking: Reported on 11/21/2014 11/18/14   Arnoldo Morale, MD  HYDROcodone-acetaminophen (NORCO/VICODIN) 5-325 MG per tablet Take 2 tablets by mouth every 4 (four) hours as needed. Patient not taking: Reported on 11/21/2014 10/31/14   Delsa Grana, PA-C  ibuprofen (ADVIL,MOTRIN) 800 MG tablet Take 1 tablet (800 mg total) by mouth 3 (three) times daily. Patient not taking: Reported on 11/21/2014 10/31/14   Delsa Grana, PA-C  ipratropium (ATROVENT) 0.02 % nebulizer solution Take 2.5 mLs (0.5 mg total) by nebulization 4 (four) times daily as needed for wheezing or shortness of breath. 11/18/14   Arnoldo Morale, MD  lisinopril-hydrochlorothiazide (PRINZIDE,ZESTORETIC) 10-12.5 MG per tablet Take 1 tablet by mouth daily. 11/18/14   Arnoldo Morale, MD  loratadine (CLARITIN) 10 MG tablet Take 1 tablet (10 mg total) by mouth daily. 09/16/14   [name], MD  mometasone-formoterol (DULERA) 200-5 MCG/ACT AERO Inhale 2 puffs into the lungs 2 (two) times daily. Rinse mouth after inhalation 09/09/14   [name], MD  predniSONE (DELTASONE) 20 MG tablet Take 2 tablets (40 mg total) by mouth daily. Take 40 mg by mouth daily for 3 days, then 20mg  by mouth daily for 3 days, then 10mg  daily for 3 days 10/31/14   Delsa Grana, PA-C  ranitidine (ZANTAC) 150 MG tablet Take 2 tablets (300 mg total) by mouth at bedtime. 11/03/14   [name], MD  sertraline (ZOLOFT) 50 MG tablet Take 1 tablet (50 mg total) by mouth daily. 11/18/14   Arnoldo Morale, MD   BP 156/90 mmHg  Pulse 90  Temp(Src) 100 F (37.8 C) (Oral)  Resp 20  SpO2 100%  LMP 11/12/2014 Physical Exam  Constitutional: She appears well-developed and well-nourished. No distress.  HENT:  Head: Normocephalic.  Eyes: Conjunctivae are normal.  Neck: Neck supple.  Cardiovascular: Normal rate, regular  rhythm and normal heart sounds.   Pulmonary/Chest: Effort normal. No respiratory distress. She has wheezes. She has no rales.  Tachypnea. Inspiratory and expiratory wheezing bilaterally  Abdominal: Soft. Bowel sounds are normal. She exhibits no distension. There is no tenderness. There is no rebound.  Musculoskeletal: She exhibits no edema.  Neurological: She is alert.  Skin: Skin is warm and dry.  Psychiatric: She has a normal mood and affect. Her behavior is normal.  Nursing note and vitals reviewed.   ED Course  Procedures (including critical care time) Labs Review Labs Reviewed  CBC - Abnormal; Notable for the following:    WBC 16.5 (*)    RDW 15.9 (*)    All other components within normal limits  COMPREHENSIVE METABOLIC PANEL - Abnormal; Notable for the following:    Potassium 3.3 (*)    Calcium 8.0 (*)    Albumin 3.2 (*)    All other components within normal limits  URINALYSIS, ROUTINE W REFLEX MICROSCOPIC (  NOT AT [facility])  Randolm Idol, ED    Imaging Review Dg Chest 2 View  11/21/2014   CLINICAL DATA:  Shortness of breath for 1 day.  History of asthma.  EXAM: CHEST  2 VIEW  COMPARISON:  11/03/2014  FINDINGS: The cardiomediastinal contours are normal. Mild central bronchitic markings. Pulmonary vasculature is normal. No consolidation, pleural effusion, or pneumothorax. No acute osseous abnormalities are seen.  IMPRESSION: Mild chronic bronchitic change.  No superimposed acute process.   Electronically Signed   By: Jeb Levering M.D.   On: 11/21/2014 06:12     EKG Interpretation   Date/Time:  Monday November 21 2014 05:24:26 EDT Ventricular Rate:  90 PR Interval:  141 QRS Duration: 84 QT Interval:  400 QTC Calculation: 489 R Axis:   55 Text Interpretation:  Sinus rhythm Borderline T wave abnormalities  Borderline prolonged QT interval Confirmed by Jeneen Rinks  MD, [place] ([id number]) on  11/21/2014 1:40:17 PM      MDM   Final diagnoses:  Asthma, unspecified asthma severity,  with acute exacerbation  Low back pain without sciatica, unspecified back pain laterality   Pt with wheezing, SOB. Feels like prior asthma exacerbation. VS normal. Will get labs, CXR, breathing tx and solumedrol ordered.     7:32 AM Pt now received 2 breathing treatments. States feeling better. VS normal other than htn. Will try another treatment.   10:09 AM Pt received 2g of magnesium, another treatment. She feels much better. She ambulated down hallway without difficulties maintaining her oxygen saturation. Plan to dc home with close outpatient follow up.   Filed Vitals:   11/21/14 0930 11/21/14 0945 11/21/14 0957 11/21/14 1008  BP: 144/69 144/78  144/78  Pulse: 87 83 114 87  Temp:    98.9 F (37.2 C)  TempSrc:    Oral  Resp: 21 22  21   SpO2: 97% 95% 95% 97%     Jeannett Senior, PA-C 11/21/14 1517  Tanna Furry, MD 11/26/14 [phone]

## 2014-11-21 NOTE — Discharge Instructions (Signed)
Take prednisone as prescribed until all gone for asthma. Continue your inhaler, breathing treatments, and normal medications. Follow up with your doctor as soon as able for recheck and further treatment.    Asthma Asthma is a recurring condition in which the airways tighten and narrow. Asthma can make it difficult to breathe. It can cause coughing, wheezing, and shortness of breath. Asthma episodes, also called asthma attacks, range from minor to life-threatening. Asthma cannot be cured, but medicines and lifestyle changes can help control it. CAUSES Asthma is believed to be caused by inherited (genetic) and environmental factors, but its exact cause is unknown. Asthma may be triggered by allergens, lung infections, or irritants in the air. Asthma triggers are different for each person. Common triggers include:   Animal dander.  Dust mites.  Cockroaches.  Pollen from trees or grass.  Mold.  Smoke.  Air pollutants such as dust, household cleaners, hair sprays, aerosol sprays, paint fumes, strong chemicals, or strong odors.  Cold air, weather changes, and winds (which increase molds and pollens in the air).  Strong emotional expressions such as crying or laughing hard.  Stress.  Certain medicines (such as aspirin) or types of drugs (such as beta-blockers).  Sulfites in foods and drinks. Foods and drinks that may contain sulfites include dried fruit, potato chips, and sparkling grape juice.  Infections or inflammatory conditions such as the flu, a cold, or an inflammation of the nasal membranes (rhinitis).  Gastroesophageal reflux disease (GERD).  Exercise or strenuous activity. SYMPTOMS Symptoms may occur immediately after asthma is triggered or many hours later. Symptoms include:  Wheezing.  Excessive nighttime or early morning coughing.  Frequent or severe coughing with a common cold.  Chest tightness.  Shortness of breath. DIAGNOSIS  The diagnosis of asthma is made  by a review of your medical history and a physical exam. Tests may also be performed. These may include:  Lung function studies. These tests show how much air you breathe in and out.  Allergy tests.  Imaging tests such as X-rays. TREATMENT  Asthma cannot be cured, but it can usually be controlled. Treatment involves identifying and avoiding your asthma triggers. It also involves medicines. There are 2 classes of medicine used for asthma treatment:   Controller medicines. These prevent asthma symptoms from occurring. They are usually taken every day.  Reliever or rescue medicines. These quickly relieve asthma symptoms. They are used as needed and provide short-term relief. Your health care provider will help you create an asthma action plan. An asthma action plan is a written plan for managing and treating your asthma attacks. It includes a list of your asthma triggers and how they may be avoided. It also includes information on when medicines should be taken and when their dosage should be changed. An action plan may also involve the use of a device called a peak flow meter. A peak flow meter measures how well the lungs are working. It helps you monitor your condition. HOME CARE INSTRUCTIONS   Take medicines only as directed by your health care provider. Speak with your health care provider if you have questions about how or when to take the medicines.  Use a peak flow meter as directed by your health care provider. Record and keep track of readings.  Understand and use the action plan to help minimize or stop an asthma attack without needing to seek medical care.  Control your home environment in the following ways to help prevent asthma attacks:  Do not  smoke. Avoid being exposed to secondhand smoke.  Change your heating and air conditioning filter regularly.  Limit your use of fireplaces and wood stoves.  Get rid of pests (such as roaches and mice) and their droppings.  Throw away  plants if you see mold on them.  Clean your floors and dust regularly. Use unscented cleaning products.  Try to have someone else vacuum for you regularly. Stay out of rooms while they are being vacuumed and for a short while afterward. If you vacuum, use a dust mask from a hardware store, a double-layered or microfilter vacuum cleaner bag, or a vacuum cleaner with a HEPA filter.  Replace carpet with wood, tile, or vinyl flooring. Carpet can trap dander and dust.  Use allergy-proof pillows, mattress covers, and box spring covers.  Wash bed sheets and blankets every week in hot water and dry them in a dryer.  Use blankets that are made of polyester or cotton.  Clean bathrooms and kitchens with bleach. If possible, have someone repaint the walls in these rooms with mold-resistant paint. Keep out of the rooms that are being cleaned and painted.  Wash hands frequently. SEEK MEDICAL CARE IF:   You have wheezing, shortness of breath, or a cough even if taking medicine to prevent attacks.  The colored mucus you cough up (sputum) is thicker than usual.  Your sputum changes from clear or white to yellow, green, gray, or bloody.  You have any problems that may be related to the medicines you are taking (such as a rash, itching, swelling, or trouble breathing).  You are using a reliever medicine more than 2-3 times per week.  Your peak flow is still at 50-79% of your personal best after following your action plan for 1 hour.  You have a fever. SEEK IMMEDIATE MEDICAL CARE IF:   You seem to be getting worse and are unresponsive to treatment during an asthma attack.  You are short of breath even at rest.  You get short of breath when doing very little physical activity.  You have difficulty eating, drinking, or talking due to asthma symptoms.  You develop chest pain.  You develop a fast heartbeat.  You have a bluish color to your lips or fingernails.  You are light-headed, dizzy, or  faint.  Your peak flow is less than 50% of your personal best. MAKE SURE YOU:   Understand these instructions.  Will watch your condition.  Will get help right away if you are not doing well or get worse. Document Released: 04/15/2005 Document Revised: 08/30/2013 Document Reviewed: 11/12/2012 [facility] Patient Information 2015 [place], [place]. This information is not intended to replace advice given to you by your health care provider. Make sure you discuss any questions you have with your health care provider.

## 2014-11-21 NOTE — ED Notes (Signed)
Patient arrives with shortness of breath since yesterday morning.  Patient states she has been using her HFA too much in the last 24 hours.  Patient states that the shortness of breath has increased during the night.  She is also having increased back pain, which is chronic for her.  She states she is having left leg pain, from knee to ankle.  No swelling or edema noted to left leg/foot.

## 2014-11-21 NOTE — Telephone Encounter (Signed)
#   vials:4 Ordered date:11/21/14  Shipping Date:10/23/14  Gave verbal rx, rep. Is going to overnight xolair del.11/22/14.

## 2014-11-22 NOTE — Telephone Encounter (Signed)
#   Vials:4 Arrival Date:11/22/14 Lot #:[id number] Exp Date:1/20

## 2014-11-25 ENCOUNTER — Ambulatory Visit: Payer: Self-pay

## 2014-11-27 ENCOUNTER — Emergency Department (HOSPITAL_COMMUNITY)
Admission: EM | Admit: 2014-11-27 | Discharge: 2014-11-27 | Disposition: A | Discharge status: Home / Self Care | Payer: Self-pay | Attending: Emergency Medicine | Admitting: Emergency Medicine

## 2014-11-27 ENCOUNTER — Encounter (HOSPITAL_COMMUNITY): Payer: Self-pay | Admitting: Emergency Medicine

## 2014-11-27 DIAGNOSIS — Z79899 Other long term (current) drug therapy: Secondary | ICD-10-CM | POA: Insufficient documentation

## 2014-11-27 DIAGNOSIS — Z7952 Long term (current) use of systemic steroids: Secondary | ICD-10-CM | POA: Insufficient documentation

## 2014-11-27 DIAGNOSIS — J441 Chronic obstructive pulmonary disease with (acute) exacerbation: Secondary | ICD-10-CM | POA: Insufficient documentation

## 2014-11-27 DIAGNOSIS — J45901 Unspecified asthma with (acute) exacerbation: Secondary | ICD-10-CM

## 2014-11-27 DIAGNOSIS — I10 Essential (primary) hypertension: Secondary | ICD-10-CM | POA: Insufficient documentation

## 2014-11-27 DIAGNOSIS — F329 Major depressive disorder, single episode, unspecified: Secondary | ICD-10-CM | POA: Insufficient documentation

## 2014-11-27 DIAGNOSIS — G4700 Insomnia, unspecified: Secondary | ICD-10-CM | POA: Insufficient documentation

## 2014-11-27 DIAGNOSIS — Z87891 Personal history of nicotine dependence: Secondary | ICD-10-CM | POA: Insufficient documentation

## 2014-11-27 DIAGNOSIS — Z9981 Dependence on supplemental oxygen: Secondary | ICD-10-CM | POA: Insufficient documentation

## 2014-11-27 DIAGNOSIS — G4733 Obstructive sleep apnea (adult) (pediatric): Secondary | ICD-10-CM | POA: Insufficient documentation

## 2014-11-27 MED ORDER — ALBUTEROL (5 MG/ML) CONTINUOUS INHALATION SOLN
10.0000 mg/h | INHALATION_SOLUTION | RESPIRATORY_TRACT | Status: DC
  Administered 2014-11-27: 10 mg/h via RESPIRATORY_TRACT
  Filled 2014-11-27: qty 20

## 2014-11-27 MED ORDER — DEXAMETHASONE 4 MG PO TABS
10.0000 mg | ORAL_TABLET | Freq: Once | ORAL | Status: AC
  Administered 2014-11-27: 10 mg via ORAL
  Filled 2014-11-27: qty 3

## 2014-11-27 MED ORDER — IPRATROPIUM BROMIDE 0.02 % IN SOLN
0.5000 mg | Freq: Once | RESPIRATORY_TRACT | Status: AC
  Administered 2014-11-27: 0.5 mg via RESPIRATORY_TRACT
  Filled 2014-11-27: qty 2.5

## 2014-11-27 MED ORDER — DOCUSATE SODIUM 100 MG PO CAPS
100.0000 mg | ORAL_CAPSULE | Freq: Once | ORAL | Status: AC
  Administered 2014-11-27: 100 mg via ORAL
  Filled 2014-11-27: qty 1

## 2014-11-27 NOTE — Discharge Instructions (Signed)
Asthma Asthma is a recurring condition in which the airways tighten and narrow. Asthma can make it difficult to breathe. It can cause coughing, wheezing, and shortness of breath. Asthma episodes, also called asthma attacks, range from minor to life-threatening. Asthma cannot be cured, but medicines and lifestyle changes can help control it. CAUSES Asthma is believed to be caused by inherited (genetic) and environmental factors, but its exact cause is unknown. Asthma may be triggered by allergens, lung infections, or irritants in the air. Asthma triggers are different for each person. Common triggers include:   Animal dander.  Dust mites.  Cockroaches.  Pollen from trees or grass.  Mold.  Smoke.  Air pollutants such as dust, household cleaners, hair sprays, aerosol sprays, paint fumes, strong chemicals, or strong odors.  Cold air, weather changes, and winds (which increase molds and pollens in the air).  Strong emotional expressions such as crying or laughing hard.  Stress.  Certain medicines (such as aspirin) or types of drugs (such as beta-blockers).  Sulfites in foods and drinks. Foods and drinks that may contain sulfites include dried fruit, potato chips, and sparkling grape juice.  Infections or inflammatory conditions such as the flu, a cold, or an inflammation of the nasal membranes (rhinitis).  Gastroesophageal reflux disease (GERD).  Exercise or strenuous activity. SYMPTOMS Symptoms may occur immediately after asthma is triggered or many hours later. Symptoms include:  Wheezing.  Excessive nighttime or early morning coughing.  Frequent or severe coughing with a common cold.  Chest tightness.  Shortness of breath. DIAGNOSIS  The diagnosis of asthma is made by a review of your medical history and a physical exam. Tests may also be performed. These may include:  Lung function studies. These tests show how much air you breathe in and out.  Allergy  tests.  Imaging tests such as X-rays. TREATMENT  Asthma cannot be cured, but it can usually be controlled. Treatment involves identifying and avoiding your asthma triggers. It also involves medicines. There are 2 classes of medicine used for asthma treatment:   Controller medicines. These prevent asthma symptoms from occurring. They are usually taken every day.  Reliever or rescue medicines. These quickly relieve asthma symptoms. They are used as needed and provide short-term relief. Your health care provider will help you create an asthma action plan. An asthma action plan is a written plan for managing and treating your asthma attacks. It includes a list of your asthma triggers and how they may be avoided. It also includes information on when medicines should be taken and when their dosage should be changed. An action plan may also involve the use of a device called a peak flow meter. A peak flow meter measures how well the lungs are working. It helps you monitor your condition. HOME CARE INSTRUCTIONS   Take medicines only as directed by your health care provider. Speak with your health care provider if you have questions about how or when to take the medicines.  Use a peak flow meter as directed by your health care provider. Record and keep track of readings.  Understand and use the action plan to help minimize or stop an asthma attack without needing to seek medical care.  Control your home environment in the following ways to help prevent asthma attacks:  Do not smoke. Avoid being exposed to secondhand smoke.  Change your heating and air conditioning filter regularly.  Limit your use of fireplaces and wood stoves.  Get rid of pests (such as roaches and   mice) and their droppings.  Throw away plants if you see mold on them.  Clean your floors and dust regularly. Use unscented cleaning products.  Try to have someone else vacuum for you regularly. Stay out of rooms while they are  being vacuumed and for a short while afterward. If you vacuum, use a dust mask from a hardware store, a double-layered or microfilter vacuum cleaner bag, or a vacuum cleaner with a HEPA filter.  Replace carpet with wood, tile, or vinyl flooring. Carpet can trap dander and dust.  Use allergy-proof pillows, mattress covers, and box spring covers.  Wash bed sheets and blankets every week in hot water and dry them in a dryer.  Use blankets that are made of polyester or cotton.  Clean bathrooms and kitchens with bleach. If possible, have someone repaint the walls in these rooms with mold-resistant paint. Keep out of the rooms that are being cleaned and painted.  Wash hands frequently. SEEK MEDICAL CARE IF:   You have wheezing, shortness of breath, or a cough even if taking medicine to prevent attacks.  The colored mucus you cough up (sputum) is thicker than usual.  Your sputum changes from clear or white to yellow, green, gray, or bloody.  You have any problems that may be related to the medicines you are taking (such as a rash, itching, swelling, or trouble breathing).  You are using a reliever medicine more than 2-3 times per week.  Your peak flow is still at 50-79% of your personal best after following your action plan for 1 hour.  You have a fever. SEEK IMMEDIATE MEDICAL CARE IF:   You seem to be getting worse and are unresponsive to treatment during an asthma attack.  You are short of breath even at rest.  You get short of breath when doing very little physical activity.  You have difficulty eating, drinking, or talking due to asthma symptoms.  You develop chest pain.  You develop a fast heartbeat.  You have a bluish color to your lips or fingernails.  You are light-headed, dizzy, or faint.  Your peak flow is less than 50% of your personal best. MAKE SURE YOU:   Understand these instructions.  Will watch your condition.  Will get help right away if you are not  doing well or get worse. Document Released: 04/15/2005 Document Revised: 08/30/2013 Document Reviewed: 11/12/2012 Exit[employer] Patient Information 2015 [employer]. This information is not intended to replace advice given to you by your health care provider. Make sure you discuss any questions you have with your health care provider.  

## 2014-11-27 NOTE — ED Provider Notes (Signed)
CSN: [id number]     Arrival date & time 11/27/14  0611 History   First MD Initiated Contact with Patient 11/27/14 [phone]     Chief Complaint  Patient presents with  . Shortness of Breath     (Consider location/radiation/quality/duration/timing/severity/associated sxs/prior Treatment) HPI [name] is a 44 y.o. female with past medical history asthma, hypertension, obesity, depression who presents to the emergency department with complaint of shortness of breath, currently on 2L [place] upon entering the patient room.  She was most recently seen in the ED on 11/21/2014 for asthma exacerbation where she received breathing treatments and steroids.  States her symptoms began last night at 10pm with shortness of breath and wheezing unrelieved at home by her albuterol and atrovent treatment.  She called EMS this morning due to persistent and unrelieved symptoms and was given breathing treatment.  Reports some relief and to feeling better with the breathing treatment but still state having some wheezing and generalized weakness.  Denies any chest pain, pain with walking or in her extremities, no swelling, no urinary complaints.   She also denies cough, fever, chills.    Past Medical History  Diagnosis Date  . Acanthosis nigricans   . Menorrhagia   . Hypertension, essential   . Insomnia   . Morbid obesity   . Helicobacter pylori (H. pylori) infection   . Sleep apnea     Sleep study 2008 - mild OSA, not enough events to titrate CPAP  . COPD (chronic obstructive pulmonary disease)     PFTs in 2002, FEV1/FVC 65, no post bronchodilater test done  . Asthma     Followed by Dr. Annamaria Boots (pulmonology); receives every other week omalizumab injections; has frequent exacerbations  . Depression   . GERD (gastroesophageal reflux disease)   . Tobacco user   . Obesity   . Shortness of breath   . Headache(784.0)   . Seasonal allergies    Past Surgical History  Procedure Laterality Date  . Tubal ligation   1996    bilateral  . Breast reduction surgery  09/2011   Family History  Problem Relation Age of Onset  . Asthma Daughter   . Cancer Paternal Aunt   . Asthma Maternal Grandmother   . Hypertension Mother    History  Substance Use Topics  . Smoking status: Former Smoker -- 18 years    Types: Cigarettes    Quit date: 09/15/2014  . Smokeless tobacco: Former Systems developer    Quit date: 09/12/2014  . Alcohol Use: No   OB History    No data available     Review of Systems  Constitutional: Negative for fever and chills.  HENT: Negative for congestion and trouble swallowing.   Respiratory: Positive for shortness of breath and wheezing. Negative for cough.   Cardiovascular: Negative for chest pain and leg swelling.  Genitourinary: Negative for dysuria and difficulty urinating.  Neurological: Positive for weakness. Negative for dizziness and syncope.  All other systems reviewed and are negative.     Allergies  Review of patient's allergies indicates no known allergies.  Home Medications   Prior to Admission medications   Medication Sig Start Date End Date Taking? Authorizing Provider  acetaminophen-codeine (TYLENOL #3) 300-30 MG per tablet Take 1 tablet by mouth every 8 (eight) hours as needed for moderate pain. Patient not taking: Reported on 11/21/2014 11/18/14   Arnoldo Morale, MD  albuterol (PROVENTIL HFA;VENTOLIN HFA) 108 (90 BASE) MCG/ACT inhaler Inhale 1-2 puffs into the lungs every  4 (four) hours as needed for wheezing or shortness of breath. Patient not taking: Reported on 11/21/2014 11/18/14   Arnoldo Morale, MD  albuterol (PROVENTIL) (2.5 MG/3ML) 0.083% nebulizer solution Take 3 mLs (2.5 mg total) by nebulization every 6 (six) hours as needed for wheezing or shortness of breath. Patient not taking: Reported on 11/21/2014 11/18/14   Arnoldo Morale, MD  cyclobenzaprine (FLEXERIL) 10 MG tablet Take 1 tablet (10 mg total) by mouth 2 (two) times daily as needed for muscle spasms. Patient not  taking: Reported on 11/21/2014 11/18/14   Arnoldo Morale, MD  HYDROcodone-acetaminophen (NORCO/VICODIN) 5-325 MG per tablet Take 2 tablets by mouth every 4 (four) hours as needed. Patient not taking: Reported on 11/21/2014 10/31/14   Delsa Grana, PA-C  ibuprofen (ADVIL,MOTRIN) 800 MG tablet Take 1 tablet (800 mg total) by mouth 3 (three) times daily. Patient not taking: Reported on 11/21/2014 10/31/14   Delsa Grana, PA-C  ipratropium (ATROVENT) 0.02 % nebulizer solution Take 2.5 mLs (0.5 mg total) by nebulization 4 (four) times daily as needed for wheezing or shortness of breath. Patient not taking: Reported on 11/21/2014 11/18/14   Arnoldo Morale, MD  lisinopril-hydrochlorothiazide (PRINZIDE,ZESTORETIC) 10-12.5 MG per tablet Take 1 tablet by mouth daily. Patient not taking: Reported on 11/21/2014 11/18/14   Arnoldo Morale, MD  loratadine (CLARITIN) 10 MG tablet Take 1 tablet (10 mg total) by mouth daily. Patient not taking: Reported on 11/21/2014 09/16/14   [name], MD  mometasone-formoterol [facility]) 200-5 MCG/ACT AERO Inhale 2 puffs into the lungs 2 (two) times daily. Rinse mouth after inhalation Patient not taking: Reported on 11/21/2014 09/09/14   [name], MD  omalizumab Arvid Right) 150 MG injection Inject 150 mg into the skin every 14 (fourteen) days.    Historical Provider, MD  predniSONE (DELTASONE) 20 MG tablet Take 2 tablets (40 mg total) by mouth daily. Take 40 mg by mouth daily for 3 days, then 20mg  by mouth daily for 3 days, then 10mg  daily for 3 days Patient not taking: Reported on 11/21/2014 10/31/14   Delsa Grana, PA-C  predniSONE (DELTASONE) 50 MG tablet Take 1 tablet (50 mg total) by mouth daily. 11/21/14   Tatyana Kirichenko, PA-C  ranitidine (ZANTAC) 150 MG tablet Take 2 tablets (300 mg total) by mouth at bedtime. Patient not taking: Reported on 11/21/2014 11/03/14   [name], MD  sertraline (ZOLOFT) 50 MG tablet Take 1 tablet (50 mg total) by mouth daily. Patient not taking: Reported on  11/21/2014 11/18/14   Arnoldo Morale, MD  zolpidem (AMBIEN) 10 MG tablet Take 10 mg by mouth at bedtime as needed for sleep.    Historical Provider, MD   BP 111/58 mmHg  Pulse 105  Temp(Src) 98.5 F (36.9 C) (Oral)  Resp 24  SpO2 100%  LMP 11/12/2014 (LMP Unknown) Physical Exam  Constitutional: She appears well-developed and well-nourished. No distress.  HENT:  Head: Normocephalic and atraumatic.  Cardiovascular: Regular rhythm and intact distal pulses.   Rate is slightly tachycardic.  Pulmonary/Chest: No respiratory distress. She has wheezes. She has no rales.  Abdominal: Soft. Bowel sounds are normal. There is no tenderness.  Musculoskeletal: She exhibits no edema.  Neurological: She is alert.  Skin: Skin is warm and dry. She is not diaphoretic.  Psychiatric: She has a normal mood and affect.    ED Course  Procedures (including critical care time) Labs Review Labs Reviewed - No data to display  Imaging Review No results found.   EKG Interpretation None  MDM   Final diagnoses:  None    44 y.o. female presenting with shortness of breath and wheezing.  Similar to previous asthma exacerbations.  Patient slightly tachycardic, tachypneic.  No respiratory distress.  Will give her continuous albuterol treatment with atrovent and decadron PO 10mg  once.  9:05am - Patient still receiving continuous breathing treatment.  Reports feeling better, wheezing improved on physical exam, good movement of air, patient resting comfortably, O2 sats 99%.  Will plan for discharge to home with close follow up with her pulmonologist for her asthma.  10:30am - Patient completed breathing treatment.  Reports improvement of symptoms.  Breath sounds remain improved on physical exam.  Plan remains same as above for discharge to home with close outpatient follow-up.    Jule Ser, DO 11/27/14 1200  Evelina Bucy, MD 11/27/14 1600

## 2014-11-27 NOTE — ED Notes (Signed)
Pt refused wheelchair for discharge 

## 2014-11-27 NOTE — ED Notes (Signed)
Pt stated that she became short of breath around 2200 last night. Pt with history of asthma, took her home albuterol treatment, but received no relief. Pt went to bed  in hopes that she would get better. Pt's difficulty breathing increased throughout the night, and she eventually called EMS early this morning. EMS gave 125 mg albuterol, and one breathing treatment. Pt received relief, and breathing better upon arrival to [facility] ED. Upon assessment by RN, pt still with expiratory wheezing.

## 2014-12-19 ENCOUNTER — Ambulatory Visit (INDEPENDENT_AMBULATORY_CARE_PROVIDER_SITE_OTHER): Payer: Self-pay

## 2014-12-19 DIAGNOSIS — J4520 Mild intermittent asthma, uncomplicated: Secondary | ICD-10-CM

## 2014-12-20 MED ORDER — OMALIZUMAB 150 MG ~~LOC~~ SOLR
225.0000 mg | Freq: Once | SUBCUTANEOUS | Status: AC
  Administered 2014-12-19: 225 mg via SUBCUTANEOUS

## 2014-12-28 ENCOUNTER — Ambulatory Visit: Payer: Self-pay | Admitting: Family Medicine

## 2014-12-30 ENCOUNTER — Telehealth: Payer: Self-pay | Admitting: Family Medicine

## 2014-12-30 ENCOUNTER — Other Ambulatory Visit: Payer: Self-pay | Admitting: *Deleted

## 2014-12-30 DIAGNOSIS — J449 Chronic obstructive pulmonary disease, unspecified: Secondary | ICD-10-CM

## 2014-12-30 MED ORDER — ALBUTEROL SULFATE HFA 108 (90 BASE) MCG/ACT IN AERS: 1.0000 | INHALATION_SPRAY | RESPIRATORY_TRACT | Status: DC | PRN

## 2014-12-30 NOTE — Telephone Encounter (Signed)
Patient called requesting medication refill on albuterol (PROVENTIL HFA;VENTOLIN HFA) 108 (90 BASE) MCG/ACT inhaler. Patient states she is completely out of it, please f/u

## 2015-01-03 ENCOUNTER — Ambulatory Visit: Payer: Self-pay

## 2015-01-08 ENCOUNTER — Emergency Department (HOSPITAL_COMMUNITY): Payer: Self-pay

## 2015-01-08 ENCOUNTER — Encounter (HOSPITAL_COMMUNITY): Payer: Self-pay | Admitting: Emergency Medicine

## 2015-01-08 ENCOUNTER — Emergency Department (HOSPITAL_COMMUNITY)
Admission: EM | Admit: 2015-01-08 | Discharge: 2015-01-08 | Disposition: A | Discharge status: Home / Self Care | Payer: Self-pay | Attending: Emergency Medicine | Admitting: Emergency Medicine

## 2015-01-08 DIAGNOSIS — F329 Major depressive disorder, single episode, unspecified: Secondary | ICD-10-CM | POA: Insufficient documentation

## 2015-01-08 DIAGNOSIS — I10 Essential (primary) hypertension: Secondary | ICD-10-CM | POA: Insufficient documentation

## 2015-01-08 DIAGNOSIS — J441 Chronic obstructive pulmonary disease with (acute) exacerbation: Secondary | ICD-10-CM | POA: Insufficient documentation

## 2015-01-08 DIAGNOSIS — Z79899 Other long term (current) drug therapy: Secondary | ICD-10-CM | POA: Insufficient documentation

## 2015-01-08 DIAGNOSIS — Z87891 Personal history of nicotine dependence: Secondary | ICD-10-CM | POA: Insufficient documentation

## 2015-01-08 DIAGNOSIS — Z8669 Personal history of other diseases of the nervous system and sense organs: Secondary | ICD-10-CM | POA: Insufficient documentation

## 2015-01-08 DIAGNOSIS — J45901 Unspecified asthma with (acute) exacerbation: Secondary | ICD-10-CM

## 2015-01-08 MED ORDER — PREDNISONE 50 MG PO TABS: 50.0000 mg | ORAL_TABLET | Freq: Every day | ORAL | Status: AC

## 2015-01-08 MED ORDER — MAGNESIUM SULFATE 2 GM/50ML IV SOLN
2.0000 g | Freq: Once | INTRAVENOUS | Status: AC
  Administered 2015-01-08: 2 g via INTRAVENOUS
  Filled 2015-01-08: qty 50

## 2015-01-08 MED ORDER — IPRATROPIUM-ALBUTEROL 0.5-2.5 (3) MG/3ML IN SOLN
3.0000 mL | Freq: Four times a day (QID) | RESPIRATORY_TRACT | Status: DC
  Administered 2015-01-08: 3 mL via RESPIRATORY_TRACT
  Filled 2015-01-08: qty 3

## 2015-01-08 MED ORDER — ALBUTEROL SULFATE (2.5 MG/3ML) 0.083% IN NEBU
5.0000 mg | INHALATION_SOLUTION | Freq: Once | RESPIRATORY_TRACT | Status: AC
  Administered 2015-01-08: 5 mg via RESPIRATORY_TRACT
  Filled 2015-01-08: qty 6

## 2015-01-08 MED ORDER — METHYLPREDNISOLONE SODIUM SUCC 125 MG IJ SOLR
125.0000 mg | Freq: Once | INTRAMUSCULAR | Status: AC
  Administered 2015-01-08: 125 mg via INTRAVENOUS
  Filled 2015-01-08: qty 2

## 2015-01-08 NOTE — ED Notes (Signed)
Pt. reports asthma attack onset 2 days ago with wheezing / dry cough unrelieved by MDI and nebulizer at home , denies fever or chills.

## 2015-01-08 NOTE — ED Provider Notes (Signed)
CSN: [id number]     Arrival date & time 01/08/15  1917 History   First MD Initiated Contact with Patient 01/08/15 2135     Chief Complaint  Patient presents with  . Asthma     (Consider location/radiation/quality/duration/timing/severity/associated sxs/prior Treatment) Patient is a 44 y.o. female presenting with shortness of breath.  Shortness of Breath Severity:  Severe Onset quality:  Gradual Timing:  Constant Progression:  Worsening Chronicity:  Chronic Context: not smoke exposure and not URI   Relieved by:  Nothing Worsened by:  Nothing tried Ineffective treatments:  Inhaler Associated symptoms: cough and wheezing   Associated symptoms: no abdominal pain, no chest pain, no fever, no headaches, no rash, no sore throat, no sputum production and no vomiting     Past Medical History  Diagnosis Date  . Acanthosis nigricans   . Menorrhagia   . Hypertension, essential   . Insomnia   . Morbid obesity   . Helicobacter pylori (H. pylori) infection   . Sleep apnea     Sleep study 2008 - mild OSA, not enough events to titrate CPAP  . COPD (chronic obstructive pulmonary disease)     PFTs in 2002, FEV1/FVC 65, no post bronchodilater test done  . Asthma     Followed by Dr. Annamaria Boots (pulmonology); receives every other week omalizumab injections; has frequent exacerbations  . Depression   . GERD (gastroesophageal reflux disease)   . Tobacco user   . Obesity   . Shortness of breath   . Headache(784.0)   . Seasonal allergies    Past Surgical History  Procedure Laterality Date  . Tubal ligation  1996    bilateral  . Breast reduction surgery  09/2011   Family History  Problem Relation Age of Onset  . Asthma Daughter   . Cancer Paternal Aunt   . Asthma Maternal Grandmother   . Hypertension Mother    Social History  Substance Use Topics  . Smoking status: Former Smoker -- 18 years    Types: Cigarettes    Quit date: 09/15/2014  . Smokeless tobacco: Former Systems developer    Quit date:  09/12/2014  . Alcohol Use: No   OB History    No data available     Review of Systems  Constitutional: Negative for fever and chills.  HENT: Negative for congestion and sore throat.   Eyes: Negative for visual disturbance.  Respiratory: Positive for cough, shortness of breath and wheezing. Negative for sputum production.   Cardiovascular: Negative for chest pain.  Gastrointestinal: Negative for nausea, vomiting, abdominal pain, diarrhea and constipation.  Genitourinary: Negative for dysuria, difficulty urinating and vaginal pain.  Musculoskeletal: Negative for myalgias and arthralgias.  Skin: Negative for rash.  Neurological: Negative for syncope and headaches.  Psychiatric/Behavioral: Negative for behavioral problems.  All other systems reviewed and are negative.     Allergies  Review of patient's allergies indicates no known allergies.  Home Medications   Prior to Admission medications   Medication Sig Start Date End Date Taking? Authorizing Provider  acetaminophen-codeine (TYLENOL #3) 300-30 MG per tablet Take 1 tablet by mouth every 8 (eight) hours as needed for moderate pain. Patient not taking: Reported on 11/21/2014 11/18/14   Arnoldo Morale, MD  albuterol (PROVENTIL HFA;VENTOLIN HFA) 108 (90 BASE) MCG/ACT inhaler Inhale 1-2 puffs into the lungs every 4 (four) hours as needed for wheezing or shortness of breath. 12/30/14   Arnoldo Morale, MD  albuterol (PROVENTIL) (2.5 MG/3ML) 0.083% nebulizer solution Take 3 mLs (2.5 mg total)  by nebulization every 6 (six) hours as needed for wheezing or shortness of breath. Patient not taking: Reported on 11/21/2014 11/18/14   Arnoldo Morale, MD  cyclobenzaprine (FLEXERIL) 10 MG tablet Take 1 tablet (10 mg total) by mouth 2 (two) times daily as needed for muscle spasms. 11/18/14   Arnoldo Morale, MD  HYDROcodone-acetaminophen (NORCO/VICODIN) 5-325 MG per tablet Take 2 tablets by mouth every 4 (four) hours as needed. Patient not taking: Reported on  11/21/2014 10/31/14   Delsa Grana, PA-C  ibuprofen (ADVIL,MOTRIN) 800 MG tablet Take 1 tablet (800 mg total) by mouth 3 (three) times daily. Patient not taking: Reported on 11/21/2014 10/31/14   Delsa Grana, PA-C  ipratropium (ATROVENT) 0.02 % nebulizer solution Take 2.5 mLs (0.5 mg total) by nebulization 4 (four) times daily as needed for wheezing or shortness of breath. Patient not taking: Reported on 11/21/2014 11/18/14   Arnoldo Morale, MD  lisinopril-hydrochlorothiazide (PRINZIDE,ZESTORETIC) 10-12.5 MG per tablet Take 1 tablet by mouth daily. 11/18/14   Arnoldo Morale, MD  loratadine (CLARITIN) 10 MG tablet Take 1 tablet (10 mg total) by mouth daily. Patient not taking: Reported on 11/21/2014 09/16/14   [name], MD  mometasone-formoterol [facility]) 200-5 MCG/ACT AERO Inhale 2 puffs into the lungs 2 (two) times daily. Rinse mouth after inhalation Patient not taking: Reported on 11/21/2014 09/09/14   [name], MD  omalizumab Arvid Right) 150 MG injection Inject 150 mg into the skin every 14 (fourteen) days.    Historical Provider, MD  predniSONE (DELTASONE) 50 MG tablet Take 1 tablet (50 mg total) by mouth daily. 01/09/15 01/12/15  Renne Musca, MD  ranitidine (ZANTAC) 150 MG tablet Take 2 tablets (300 mg total) by mouth at bedtime. Patient taking differently: Take 300 mg by mouth every morning.  11/03/14   [name], MD  sertraline (ZOLOFT) 50 MG tablet Take 1 tablet (50 mg total) by mouth daily. 11/18/14   Arnoldo Morale, MD  zolpidem (AMBIEN) 10 MG tablet Take 10 mg by mouth at bedtime as needed for sleep.    Historical Provider, MD   BP 160/84 mmHg  Pulse 100  Temp(Src) 98.5 F (36.9 C) (Oral)  Resp 23  Ht 5\' 6"  (1.676 m)  Wt 315 lb (142.883 kg)  BMI 50.87 kg/m2  SpO2 100%  LMP 12/12/2014 Physical Exam  Constitutional: She is oriented to person, place, and time. She appears well-developed and well-nourished. No distress.  HENT:  Head: Normocephalic and atraumatic.  Eyes: EOM are normal.  Neck:  Normal range of motion.  Cardiovascular: Normal rate, regular rhythm and normal heart sounds.   No murmur heard. Pulmonary/Chest: Effort normal. No respiratory distress. She has wheezes.  Abdominal: Soft. There is no tenderness.  Musculoskeletal: She exhibits no edema.  Neurological: She is alert and oriented to person, place, and time.  Skin: She is not diaphoretic.  Psychiatric: She has a normal mood and affect. Her behavior is normal.    ED Course  Procedures (including critical care time) Labs Review Labs Reviewed - No data to display  Imaging Review Dg Chest 2 View  01/08/2015   CLINICAL DATA:  Initial evaluation for acute shortness of breath, wheezing.  EXAM: CHEST  2 VIEW  COMPARISON:  Prior radiograph from 11/21/2014.  FINDINGS: The cardiac and mediastinal silhouettes are stable in size and contour, and remain within normal limits.  The lungs are normally inflated. Mild chronic bronchitic changes, stable. No airspace consolidation, pleural effusion, or pulmonary edema is identified. There is no pneumothorax.  No acute osseous abnormality identified.  IMPRESSION: Mild chronic bronchitic changes, similar to prior. No superimposed acute cardiopulmonary disease.   Electronically Signed   By: Jeannine Boga M.D.   On: 01/08/2015 21:31   I have personally reviewed and evaluated these images and lab results as part of my medical decision-making.   EKG Interpretation None      MDM   Final diagnoses:  Asthma exacerbation    Patient is a 44 year old female with a history of recurrent asthma exacerbations that presents with 2 days of dry cough, wheezing without any improvement with her home nebulizer. On arrival patient is afebrile stable vital signs. Patient has no respiratory distress. Patient has bilateral wheezes. Patient states that typically magnesium and slight Medrol and with intensive help break her. Patient was given magnesium, slight Medrol, albuterol and had  significant improvement of symptoms and feels much better at this time. Patient will be discharged with a course of steroids and to follow up with her PCP.    Renne Musca, MD 01/08/15 [id number]  Leonard Schwartz, MD 01/15/15 [phone]

## 2015-01-08 NOTE — Discharge Instructions (Signed)
Asthma Asthma is a recurring condition in which the airways tighten and narrow. Asthma can make it difficult to breathe. It can cause coughing, wheezing, and shortness of breath. Asthma episodes, also called asthma attacks, range from minor to life-threatening. Asthma cannot be cured, but medicines and lifestyle changes can help control it. CAUSES Asthma is believed to be caused by inherited (genetic) and environmental factors, but its exact cause is unknown. Asthma may be triggered by allergens, lung infections, or irritants in the air. Asthma triggers are different for each person. Common triggers include:   Animal dander.  Dust mites.  Cockroaches.  Pollen from trees or grass.  Mold.  Smoke.  Air pollutants such as dust, household cleaners, hair sprays, aerosol sprays, paint fumes, strong chemicals, or strong odors.  Cold air, weather changes, and winds (which increase molds and pollens in the air).  Strong emotional expressions such as crying or laughing hard.  Stress.  Certain medicines (such as aspirin) or types of drugs (such as beta-blockers).  Sulfites in foods and drinks. Foods and drinks that may contain sulfites include dried fruit, potato chips, and sparkling grape juice.  Infections or inflammatory conditions such as the flu, a cold, or an inflammation of the nasal membranes (rhinitis).  Gastroesophageal reflux disease (GERD).  Exercise or strenuous activity. SYMPTOMS Symptoms may occur immediately after asthma is triggered or many hours later. Symptoms include:  Wheezing.  Excessive nighttime or early morning coughing.  Frequent or severe coughing with a common cold.  Chest tightness.  Shortness of breath. DIAGNOSIS  The diagnosis of asthma is made by a review of your medical history and a physical exam. Tests may also be performed. These may include:  Lung function studies. These tests show how much air you breathe in and out.  Allergy  tests.  Imaging tests such as X-rays. TREATMENT  Asthma cannot be cured, but it can usually be controlled. Treatment involves identifying and avoiding your asthma triggers. It also involves medicines. There are 2 classes of medicine used for asthma treatment:   Controller medicines. These prevent asthma symptoms from occurring. They are usually taken every day.  Reliever or rescue medicines. These quickly relieve asthma symptoms. They are used as needed and provide short-term relief. Your health care provider will help you create an asthma action plan. An asthma action plan is a written plan for managing and treating your asthma attacks. It includes a list of your asthma triggers and how they may be avoided. It also includes information on when medicines should be taken and when their dosage should be changed. An action plan may also involve the use of a device called a peak flow meter. A peak flow meter measures how well the lungs are working. It helps you monitor your condition. HOME CARE INSTRUCTIONS   Take medicines only as directed by your health care provider. Speak with your health care provider if you have questions about how or when to take the medicines.  Use a peak flow meter as directed by your health care provider. Record and keep track of readings.  Understand and use the action plan to help minimize or stop an asthma attack without needing to seek medical care.  Control your home environment in the following ways to help prevent asthma attacks:  Do not smoke. Avoid being exposed to secondhand smoke.  Change your heating and air conditioning filter regularly.  Limit your use of fireplaces and wood stoves.  Get rid of pests (such as roaches and   mice) and their droppings.  Throw away plants if you see mold on them.  Clean your floors and dust regularly. Use unscented cleaning products.  Try to have someone else vacuum for you regularly. Stay out of rooms while they are  being vacuumed and for a short while afterward. If you vacuum, use a dust mask from a hardware store, a double-layered or microfilter vacuum cleaner bag, or a vacuum cleaner with a HEPA filter.  Replace carpet with wood, tile, or vinyl flooring. Carpet can trap dander and dust.  Use allergy-proof pillows, mattress covers, and box spring covers.  Wash bed sheets and blankets every week in hot water and dry them in a dryer.  Use blankets that are made of polyester or cotton.  Clean bathrooms and kitchens with bleach. If possible, have someone repaint the walls in these rooms with mold-resistant paint. Keep out of the rooms that are being cleaned and painted.  Wash hands frequently. SEEK MEDICAL CARE IF:   You have wheezing, shortness of breath, or a cough even if taking medicine to prevent attacks.  The colored mucus you cough up (sputum) is thicker than usual.  Your sputum changes from clear or white to yellow, green, gray, or bloody.  You have any problems that may be related to the medicines you are taking (such as a rash, itching, swelling, or trouble breathing).  You are using a reliever medicine more than 2-3 times per week.  Your peak flow is still at 50-79% of your personal best after following your action plan for 1 hour.  You have a fever. SEEK IMMEDIATE MEDICAL CARE IF:   You seem to be getting worse and are unresponsive to treatment during an asthma attack.  You are short of breath even at rest.  You get short of breath when doing very little physical activity.  You have difficulty eating, drinking, or talking due to asthma symptoms.  You develop chest pain.  You develop a fast heartbeat.  You have a bluish color to your lips or fingernails.  You are light-headed, dizzy, or faint.  Your peak flow is less than 50% of your personal best. MAKE SURE YOU:   Understand these instructions.  Will watch your condition.  Will get help right away if you are not  doing well or get worse. Document Released: 04/15/2005 Document Revised: 08/30/2013 Document Reviewed: 11/12/2012 Exit[employer] Patient Information 2015 [employer]. This information is not intended to replace advice given to you by your health care provider. Make sure you discuss any questions you have with your health care provider.  

## 2015-01-13 ENCOUNTER — Other Ambulatory Visit: Payer: Self-pay | Admitting: *Deleted

## 2015-01-13 DIAGNOSIS — J449 Chronic obstructive pulmonary disease, unspecified: Secondary | ICD-10-CM

## 2015-01-13 MED ORDER — ALBUTEROL SULFATE HFA 108 (90 BASE) MCG/ACT IN AERS: 1.0000 | INHALATION_SPRAY | RESPIRATORY_TRACT | Status: DC | PRN

## 2015-01-13 NOTE — Telephone Encounter (Signed)
Patient states she did not miss her follow up appointment but this RN thinks she did miss her scheduled appointment.  She needs refill on her asthma inhaler.  Will give her 1 month refill and transferred her to [facility] for first available appointment with Dr. Jarold Song

## 2015-01-18 ENCOUNTER — Ambulatory Visit: Payer: Self-pay | Admitting: Family Medicine

## 2015-01-26 ENCOUNTER — Ambulatory Visit: Payer: Self-pay | Admitting: Internal Medicine

## 2015-02-08 ENCOUNTER — Emergency Department (HOSPITAL_COMMUNITY): Payer: Self-pay

## 2015-02-08 ENCOUNTER — Emergency Department (HOSPITAL_COMMUNITY)
Admission: EM | Admit: 2015-02-08 | Discharge: 2015-02-08 | Disposition: A | Discharge status: Home / Self Care | Payer: Self-pay | Attending: Emergency Medicine | Admitting: Emergency Medicine

## 2015-02-08 ENCOUNTER — Encounter (HOSPITAL_COMMUNITY): Payer: Self-pay | Admitting: Family Medicine

## 2015-02-08 DIAGNOSIS — Z872 Personal history of diseases of the skin and subcutaneous tissue: Secondary | ICD-10-CM | POA: Insufficient documentation

## 2015-02-08 DIAGNOSIS — Z87891 Personal history of nicotine dependence: Secondary | ICD-10-CM | POA: Insufficient documentation

## 2015-02-08 DIAGNOSIS — I10 Essential (primary) hypertension: Secondary | ICD-10-CM | POA: Insufficient documentation

## 2015-02-08 DIAGNOSIS — J441 Chronic obstructive pulmonary disease with (acute) exacerbation: Secondary | ICD-10-CM | POA: Insufficient documentation

## 2015-02-08 DIAGNOSIS — J45901 Unspecified asthma with (acute) exacerbation: Secondary | ICD-10-CM

## 2015-02-08 DIAGNOSIS — G4700 Insomnia, unspecified: Secondary | ICD-10-CM | POA: Insufficient documentation

## 2015-02-08 DIAGNOSIS — Z79899 Other long term (current) drug therapy: Secondary | ICD-10-CM | POA: Insufficient documentation

## 2015-02-08 DIAGNOSIS — Z8719 Personal history of other diseases of the digestive system: Secondary | ICD-10-CM | POA: Insufficient documentation

## 2015-02-08 DIAGNOSIS — F329 Major depressive disorder, single episode, unspecified: Secondary | ICD-10-CM | POA: Insufficient documentation

## 2015-02-08 DIAGNOSIS — Z8742 Personal history of other diseases of the female genital tract: Secondary | ICD-10-CM | POA: Insufficient documentation

## 2015-02-08 MED ORDER — ALBUTEROL SULFATE (2.5 MG/3ML) 0.083% IN NEBU
5.0000 mg | INHALATION_SOLUTION | Freq: Once | RESPIRATORY_TRACT | Status: AC
  Administered 2015-02-08: 2.5 mg via RESPIRATORY_TRACT

## 2015-02-08 MED ORDER — IPRATROPIUM-ALBUTEROL 0.5-2.5 (3) MG/3ML IN SOLN
RESPIRATORY_TRACT | Status: AC
  Administered 2015-02-08: 3 mL
  Filled 2015-02-08: qty 3

## 2015-02-08 MED ORDER — DEXAMETHASONE SODIUM PHOSPHATE 10 MG/ML IJ SOLN
10.0000 mg | Freq: Once | INTRAMUSCULAR | Status: AC
  Administered 2015-02-08: 10 mg via INTRAMUSCULAR
  Filled 2015-02-08: qty 1

## 2015-02-08 MED ORDER — IPRATROPIUM-ALBUTEROL 0.5-2.5 (3) MG/3ML IN SOLN
3.0000 mL | Freq: Once | RESPIRATORY_TRACT | Status: AC
  Administered 2015-02-08: 3 mL via RESPIRATORY_TRACT
  Filled 2015-02-08: qty 3

## 2015-02-08 MED ORDER — FEXOFENADINE HCL 60 MG PO TABS: 60.0000 mg | ORAL_TABLET | Freq: Two times a day (BID) | ORAL | Status: DC

## 2015-02-08 MED ORDER — ALBUTEROL SULFATE HFA 108 (90 BASE) MCG/ACT IN AERS
2.0000 | INHALATION_SPRAY | Freq: Once | RESPIRATORY_TRACT | Status: AC
  Administered 2015-02-08: 2 via RESPIRATORY_TRACT
  Filled 2015-02-08: qty 6.7

## 2015-02-08 MED ORDER — ALBUTEROL SULFATE (2.5 MG/3ML) 0.083% IN NEBU
INHALATION_SOLUTION | RESPIRATORY_TRACT | Status: AC
  Filled 2015-02-08: qty 3

## 2015-02-08 NOTE — Discharge Instructions (Signed)
Take allegra daily as prescribed. You can also take flonase sold over the counter to help with postnasal drainage. Continue your steroids. Follow up with primary care doctor.    Asthma, Adult Asthma is a recurring condition in which the airways tighten and narrow. Asthma can make it difficult to breathe. It can cause coughing, wheezing, and shortness of breath. Asthma episodes, also called asthma attacks, range from minor to life-threatening. Asthma cannot be cured, but medicines and lifestyle changes can help control it. CAUSES Asthma is believed to be caused by inherited (genetic) and environmental factors, but its exact cause is unknown. Asthma may be triggered by allergens, lung infections, or irritants in the air. Asthma triggers are different for each person. Common triggers include:   Animal dander.  Dust mites.  Cockroaches.  Pollen from trees or grass.  Mold.  Smoke.  Air pollutants such as dust, household cleaners, hair sprays, aerosol sprays, paint fumes, strong chemicals, or strong odors.  Cold air, weather changes, and winds (which increase molds and pollens in the air).  Strong emotional expressions such as crying or laughing hard.  Stress.  Certain medicines (such as aspirin) or types of drugs (such as beta-blockers).  Sulfites in foods and drinks. Foods and drinks that may contain sulfites include dried fruit, potato chips, and sparkling grape juice.  Infections or inflammatory conditions such as the flu, a cold, or an inflammation of the nasal membranes (rhinitis).  Gastroesophageal reflux disease (GERD).  Exercise or strenuous activity. SYMPTOMS Symptoms may occur immediately after asthma is triggered or many hours later. Symptoms include:  Wheezing.  Excessive nighttime or early morning coughing.  Frequent or severe coughing with a common cold.  Chest tightness.  Shortness of breath. DIAGNOSIS  The diagnosis of asthma is made by a review of your  medical history and a physical exam. Tests may also be performed. These may include:  Lung function studies. These tests show how much air you breathe in and out.  Allergy tests.  Imaging tests such as X-rays. TREATMENT  Asthma cannot be cured, but it can usually be controlled. Treatment involves identifying and avoiding your asthma triggers. It also involves medicines. There are 2 classes of medicine used for asthma treatment:   Controller medicines. These prevent asthma symptoms from occurring. They are usually taken every day.  Reliever or rescue medicines. These quickly relieve asthma symptoms. They are used as needed and provide short-term relief. Your health care provider will help you create an asthma action plan. An asthma action plan is a written plan for managing and treating your asthma attacks. It includes a list of your asthma triggers and how they may be avoided. It also includes information on when medicines should be taken and when their dosage should be changed. An action plan may also involve the use of a device called a peak flow meter. A peak flow meter measures how well the lungs are working. It helps you monitor your condition. HOME CARE INSTRUCTIONS   Take medicines only as directed by your health care provider. Speak with your health care provider if you have questions about how or when to take the medicines.  Use a peak flow meter as directed by your health care provider. Record and keep track of readings.  Understand and use the action plan to help minimize or stop an asthma attack without needing to seek medical care.  Control your home environment in the following ways to help prevent asthma attacks:  Do not smoke. Avoid  being exposed to secondhand smoke.  Change your heating and air conditioning filter regularly.  Limit your use of fireplaces and wood stoves.  Get rid of pests (such as roaches and mice) and their droppings.  Throw away plants if you see  mold on them.  Clean your floors and dust regularly. Use unscented cleaning products.  Try to have someone else vacuum for you regularly. Stay out of rooms while they are being vacuumed and for a short while afterward. If you vacuum, use a dust mask from a hardware store, a double-layered or microfilter vacuum cleaner bag, or a vacuum cleaner with a HEPA filter.  Replace carpet with wood, tile, or vinyl flooring. Carpet can trap dander and dust.  Use allergy-proof pillows, mattress covers, and box spring covers.  Wash bed sheets and blankets every week in hot water and dry them in a dryer.  Use blankets that are made of polyester or cotton.  Clean bathrooms and kitchens with bleach. If possible, have someone repaint the walls in these rooms with mold-resistant paint. Keep out of the rooms that are being cleaned and painted.  Wash hands frequently. SEEK MEDICAL CARE IF:   You have wheezing, shortness of breath, or a cough even if taking medicine to prevent attacks.  The colored mucus you cough up (sputum) is thicker than usual.  Your sputum changes from clear or white to yellow, green, gray, or bloody.  You have any problems that may be related to the medicines you are taking (such as a rash, itching, swelling, or trouble breathing).  You are using a reliever medicine more than 2-3 times per week.  Your peak flow is still at 50-79% of your personal best after following your action plan for 1 hour.  You have a fever. SEEK IMMEDIATE MEDICAL CARE IF:   You seem to be getting worse and are unresponsive to treatment during an asthma attack.  You are short of breath even at rest.  You get short of breath when doing very little physical activity.  You have difficulty eating, drinking, or talking due to asthma symptoms.  You develop chest pain.  You develop a fast heartbeat.  You have a bluish color to your lips or fingernails.  You are light-headed, dizzy, or faint.  Your  peak flow is less than 50% of your personal best.   This information is not intended to replace advice given to you by your health care provider. Make sure you discuss any questions you have with your health care provider.   Document Released: 04/15/2005 Document Revised: 01/04/2015 Document Reviewed: 11/12/2012 Elsevier Interactive Patient Education [employer].

## 2015-02-08 NOTE — ED Provider Notes (Signed)
CSN: [id number]     Arrival date & time 02/08/15  1610 History  By signing my name below, I, Helane Gunther, attest that this documentation has been prepared under the direction and in the presence of [employer], PA-C. Electronically Signed: Helane Gunther, ED Scribe. 02/08/2015. 6:42 PM.    Chief Complaint  Patient presents with  . Asthma   The history is provided by the patient. No language interpreter was used.   HPI Comments: April [name] is a 44 y.o. female with a PMHx of asthma, COPD, and SOB who presents to the Emergency Department complaining of an asthma flare up onset 1 week ago, worsening today. She reports associated sore throat, wheezing, mild congestion, and cough (worse at night). She states she has tried taking albuterol and atrovent breathing treatments every 2-3 hours when she is at home. She notes she took 20 mg of steroids staring 2 days ago after consulting with her PCP with no relief. Pt states she takes Xolair every 2 weeks. Pt denies smoking, though she notes she has been around smoke.   Past Medical History  Diagnosis Date  . Acanthosis nigricans   . Menorrhagia   . Hypertension, essential   . Insomnia   . Morbid obesity ([place])   . Helicobacter pylori (H. pylori) infection   . Sleep apnea     Sleep study 2008 - mild OSA, not enough events to titrate CPAP  . COPD (chronic obstructive pulmonary disease) ([place])     PFTs in 2002, FEV1/FVC 65, no post bronchodilater test done  . Asthma     Followed by Dr. Annamaria Boots (pulmonology); receives every other week omalizumab injections; has frequent exacerbations  . Depression   . GERD (gastroesophageal reflux disease)   . Tobacco user   . Obesity   . Shortness of breath   . Headache(784.0)   . Seasonal allergies    Past Surgical History  Procedure Laterality Date  . Tubal ligation  1996    bilateral  . Breast reduction surgery  09/2011   Family History  Problem Relation Age of Onset  . Asthma Daughter   .  Cancer Paternal Aunt   . Asthma Maternal Grandmother   . Hypertension Mother    Social History  Substance Use Topics  . Smoking status: Former Smoker -- 18 years    Types: Cigarettes    Quit date: 09/15/2014  . Smokeless tobacco: Former Systems developer    Quit date: 09/12/2014  . Alcohol Use: No   OB History    No data available     Review of Systems  HENT: Positive for congestion and sore throat.   Respiratory: Positive for cough and wheezing.     Allergies  Review of patient's allergies indicates no known allergies.  Home Medications   Prior to Admission medications   Medication Sig Start Date End Date Taking? Authorizing Provider  albuterol (PROVENTIL HFA;VENTOLIN HFA) 108 (90 BASE) MCG/ACT inhaler Inhale 1-2 puffs into the lungs every 4 (four) hours as needed for wheezing or shortness of breath. Patient needs appointment before new Rx can be given 01/13/15  Yes Arnoldo Morale, MD  albuterol (PROVENTIL) (2.5 MG/3ML) 0.083% nebulizer solution Take 3 mLs (2.5 mg total) by nebulization every 6 (six) hours as needed for wheezing or shortness of breath. 11/18/14  Yes Arnoldo Morale, MD  ipratropium (ATROVENT) 0.02 % nebulizer solution Take 2.5 mLs (0.5 mg total) by nebulization 4 (four) times daily as needed for wheezing or shortness of breath. 11/18/14  Yes Arnoldo Morale, MD  lisinopril-hydrochlorothiazide (PRINZIDE,ZESTORETIC) 10-12.5 MG per tablet Take 1 tablet by mouth daily. 11/18/14  Yes Arnoldo Morale, MD  mometasone-formoterol (DULERA) 200-5 MCG/ACT AERO Inhale 2 puffs into the lungs 2 (two) times daily. Rinse mouth after inhalation 09/09/14  Yes [name], MD  omalizumab Arvid Right) 150 MG injection Inject 150 mg into the skin every 14 (fourteen) days.   Yes Historical Provider, MD  predniSONE (DELTASONE) 10 MG tablet Take 20 mg by mouth 2 (two) times daily.  02/05/15  Yes Historical Provider, MD  ranitidine (ZANTAC) 150 MG tablet Take 2 tablets (300 mg total) by mouth at bedtime. Patient taking  differently: Take 300 mg by mouth every morning.  11/03/14  Yes [name], MD  sertraline (ZOLOFT) 50 MG tablet Take 1 tablet (50 mg total) by mouth daily. 11/18/14  Yes Arnoldo Morale, MD  zolpidem (AMBIEN) 10 MG tablet Take 10 mg by mouth at bedtime as needed for sleep.   Yes Historical Provider, MD   BP 174/99 mmHg  Pulse 92  Temp(Src) 98.5 F (36.9 C) (Oral)  Resp 18  SpO2 98%  LMP 01/09/2015 Physical Exam  Constitutional: She is oriented to person, place, and time. She appears well-developed and well-nourished.  HENT:  Head: Normocephalic.  Right Ear: Tympanic membrane, external ear and ear canal normal.  Left Ear: Tympanic membrane, external ear and ear canal normal.  Nose: Mucosal edema present.  Mouth/Throat: Uvula is midline, oropharynx is clear and moist and mucous membranes are normal. No posterior oropharyngeal edema or posterior oropharyngeal erythema.  Eyes: Conjunctivae and EOM are normal.  Neck: Normal range of motion.  Cardiovascular: Normal rate, regular rhythm and normal heart sounds.   Pulmonary/Chest: Effort normal. She has wheezes.  End expiratory wheezes  In all lung fields bilaterally.  Abdominal: She exhibits no distension.  Musculoskeletal: Normal range of motion.  Neurological: She is alert and oriented to person, place, and time.  Psychiatric: She has a normal mood and affect.  Nursing note and vitals reviewed.   ED Course  Procedures  DIAGNOSTIC STUDIES: Oxygen Saturation is 98% on RA, normal by my interpretation.    COORDINATION OF CARE: 6:40 PM - Discussed plans to order a breathing treatment, steroids, and a chest XR. Advised pt to take a daily allergy medication. Pt advised of plan for treatment and pt agrees.  Labs Review Labs Reviewed - No data to display  Imaging Review Dg Chest 2 View  02/08/2015  CLINICAL DATA:  Shortness breath, cough, wheezing.  Prior smoker. EXAM: CHEST  2 VIEW COMPARISON:  01/08/2015 FINDINGS: Mild chronic  peribronchial thickening. Heart and mediastinal contours are within normal limits. No focal opacities or effusions. No acute bony abnormality. IMPRESSION: Mild chronic bronchitic changes. Electronically Signed   By: Rolm Baptise M.D.   On: 02/08/2015 19:08   I have personally reviewed and evaluated these images and lab results as part of my medical decision-making.   EKG Interpretation None      MDM   Final diagnoses:  Asthma exacerbation    patient emergency department with wheezing, states that she's having asthma exacerbation. Just started prednisone taper 2 days ago. Patient complaining of cough, wheezing, shortness of breath. Vital signs show hypertension, normal oxygen saturation and heart rate. Patient is afebrile. Will get chest x-ray given productive cough and shortness of breath. Will give a breathing treatment 2, the patient is requesting a shot of Decadron. 10 mg IM ordered.  7:30 PM Patient had 2 breathing  treatments. Normal vital signs except for hypertension. Patient will need to follow-up with primary care doctor regarding her hypertension. Possibly change in weather as well as seasonal allergies is triggering patient's increased wheezing. We'll continue neb treatments at home, instructed to take a neb every 4 hours. Continue prednisone that she already has those prescribed by her primary care doctor. Also will start on Allegra for allergies and recommended doing Flonase for postnasal drainage. Will have patient follow-up with primary care doctor soon as possible for recheck. Return precautions discussed. Patient requested inhaler prior to discharge.  Filed Vitals:   02/08/15 1628 02/08/15 1915  BP: 174/99 178/100  Pulse: 92 93  Temp: 98.5 F (36.9 C)   TempSrc: Oral   Resp: 18 16  SpO2: 98% 98%    I personally performed the services described in this documentation, which was scribed in my presence. The recorded information has been reviewed and is  accurate.   Jeannett Senior, PA-C 02/08/15 2002  Lacretia Leigh, MD 02/08/15 [phone]

## 2015-02-08 NOTE — ED Notes (Signed)
Pt here for asthma flare up. sts she has been doing neb treatments at home and no relief. Sts last one 2 hours ago.

## 2015-02-08 NOTE — ED Notes (Signed)
Patient reports she has been using her inhaler and nebulizer.  She states she feels like she needs a steroid.  Patient with noted sob when walked to room.  She has exp wheezing noted.  Denies chest pain.  Denies weight gain.  She has had some production with her cough.  Yellow in color.  She also has nasal congestion and sore throat

## 2015-03-04 ENCOUNTER — Emergency Department (HOSPITAL_COMMUNITY)
Admission: EM | Admit: 2015-03-04 | Discharge: 2015-03-04 | Disposition: A | Discharge status: Home / Self Care | Payer: Self-pay | Attending: Emergency Medicine | Admitting: Emergency Medicine

## 2015-03-04 ENCOUNTER — Encounter (HOSPITAL_COMMUNITY): Payer: Self-pay | Admitting: *Deleted

## 2015-03-04 DIAGNOSIS — Z87891 Personal history of nicotine dependence: Secondary | ICD-10-CM | POA: Insufficient documentation

## 2015-03-04 DIAGNOSIS — B9789 Other viral agents as the cause of diseases classified elsewhere: Secondary | ICD-10-CM

## 2015-03-04 DIAGNOSIS — J069 Acute upper respiratory infection, unspecified: Secondary | ICD-10-CM | POA: Insufficient documentation

## 2015-03-04 DIAGNOSIS — J45901 Unspecified asthma with (acute) exacerbation: Secondary | ICD-10-CM

## 2015-03-04 DIAGNOSIS — I10 Essential (primary) hypertension: Secondary | ICD-10-CM | POA: Insufficient documentation

## 2015-03-04 DIAGNOSIS — J441 Chronic obstructive pulmonary disease with (acute) exacerbation: Secondary | ICD-10-CM | POA: Insufficient documentation

## 2015-03-04 DIAGNOSIS — F329 Major depressive disorder, single episode, unspecified: Secondary | ICD-10-CM | POA: Insufficient documentation

## 2015-03-04 DIAGNOSIS — Z79899 Other long term (current) drug therapy: Secondary | ICD-10-CM | POA: Insufficient documentation

## 2015-03-04 MED ORDER — DEXAMETHASONE SODIUM PHOSPHATE 10 MG/ML IJ SOLN
10.0000 mg | Freq: Once | INTRAMUSCULAR | Status: AC
  Administered 2015-03-04: 10 mg via INTRAMUSCULAR
  Filled 2015-03-04: qty 1

## 2015-03-04 MED ORDER — IPRATROPIUM BROMIDE 0.02 % IN SOLN
1.0000 mg | Freq: Once | RESPIRATORY_TRACT | Status: AC
  Administered 2015-03-04: 1 mg via RESPIRATORY_TRACT
  Filled 2015-03-04: qty 5

## 2015-03-04 MED ORDER — ALBUTEROL (5 MG/ML) CONTINUOUS INHALATION SOLN
10.0000 mg/h | INHALATION_SOLUTION | RESPIRATORY_TRACT | Status: AC
  Administered 2015-03-04: 10 mg/h via RESPIRATORY_TRACT
  Filled 2015-03-04: qty 20
  Filled 2015-03-04: qty 40

## 2015-03-04 MED ORDER — ALBUTEROL (5 MG/ML) CONTINUOUS INHALATION SOLN
10.0000 mg/h | INHALATION_SOLUTION | RESPIRATORY_TRACT | Status: AC
Start: 2015-03-04 — End: 2015-03-04
  Administered 2015-03-04: 10 mg/h via RESPIRATORY_TRACT

## 2015-03-04 MED ORDER — ALBUTEROL SULFATE HFA 108 (90 BASE) MCG/ACT IN AERS
2.0000 | INHALATION_SPRAY | RESPIRATORY_TRACT | Status: DC | PRN
  Administered 2015-03-04: 2 via RESPIRATORY_TRACT
  Filled 2015-03-04: qty 6.7

## 2015-03-04 MED ORDER — PREDNISONE 10 MG PO TABS: 40.0000 mg | ORAL_TABLET | Freq: Every day | ORAL | Status: DC

## 2015-03-04 NOTE — Discharge Instructions (Signed)
1. Medications: albuterol, prednisone, mucinex - OTC, usual home medications 2. Treatment: rest, drink plenty of fluids, take tylenol or ibuprofen for fever control 3. Follow Up: Please followup with your primary doctor in 3 days for discussion of your diagnoses and further evaluation after today's visit; if you do not have a primary care doctor use the resource guide provided to find one; Return to the ER for high fevers, difficulty breathing or other concerning symptoms    Asthma, Acute Bronchospasm Acute bronchospasm caused by asthma is also referred to as an asthma attack. Bronchospasm means your air passages become narrowed. The narrowing is caused by inflammation and tightening of the muscles in the air tubes (bronchi) in your lungs. This can make it hard to breathe or cause you to wheeze and cough. CAUSES Possible triggers are:  Animal dander from the skin, hair, or feathers of animals.  Dust mites contained in house dust.  Cockroaches.  Pollen from trees or grass.  Mold.  Cigarette or tobacco smoke.  Air pollutants such as dust, household cleaners, hair sprays, aerosol sprays, paint fumes, strong chemicals, or strong odors.  Cold air or weather changes. Cold air may trigger inflammation. Winds increase molds and pollens in the air.  Strong emotions such as crying or laughing hard.  Stress.  Certain medicines such as aspirin or beta-blockers.  Sulfites in foods and drinks, such as dried fruits and wine.  Infections or inflammatory conditions, such as a flu, cold, or inflammation of the nasal membranes (rhinitis).  Gastroesophageal reflux disease (GERD). GERD is a condition where stomach acid backs up into your esophagus.  Exercise or strenuous activity. SIGNS AND SYMPTOMS   Wheezing.  Excessive coughing, particularly at night.  Chest tightness.  Shortness of breath. DIAGNOSIS  Your health care provider will ask you about your medical history and perform a  physical exam. A chest X-ray or blood testing may be performed to look for other causes of your symptoms or other conditions that may have triggered your asthma attack. TREATMENT  Treatment is aimed at reducing inflammation and opening up the airways in your lungs. Most asthma attacks are treated with inhaled medicines. These include quick relief or rescue medicines (such as bronchodilators) and controller medicines (such as inhaled corticosteroids). These medicines are sometimes given through an inhaler or a nebulizer. Systemic steroid medicine taken by mouth or given through an IV tube also can be used to reduce the inflammation when an attack is moderate or severe. Antibiotic medicines are only used if a bacterial infection is present.  HOME CARE INSTRUCTIONS   Rest.  Drink plenty of liquids. This helps the mucus to remain thin and be easily coughed up. Only use caffeine in moderation and do not use alcohol until you have recovered from your illness.  Do not smoke. Avoid being exposed to secondhand smoke.  You play a critical role in keeping yourself in good health. Avoid exposure to things that cause you to wheeze or to have breathing problems.  Keep your medicines up-to-date and available. Carefully follow your health care provider's treatment plan.  Take your medicine exactly as prescribed.  When pollen or pollution is bad, keep windows closed and use an air conditioner or go to places with air conditioning.  Asthma requires careful medical care. See your health care provider for a follow-up as advised. If you are more than [redacted] weeks pregnant and you were prescribed any new medicines, let your obstetrician know about the visit and how you are doing.  Follow up with your health care provider as directed.  After you have recovered from your asthma attack, make an appointment with your outpatient doctor to talk about ways to reduce the likelihood of future attacks. If you do not have a doctor  who manages your asthma, make an appointment with a primary care doctor to discuss your asthma. SEEK IMMEDIATE MEDICAL CARE IF:   You are getting worse.  You have trouble breathing. If severe, call your local emergency services (911 in the [place]  You develop chest pain or discomfort.  You are vomiting.  You are not able to keep fluids down.  You are coughing up yellow, green, brown, or bloody sputum.  You have a fever and your symptoms suddenly get worse.  You have trouble swallowing. MAKE SURE YOU:   Understand these instructions.  Will watch your condition.  Will get help right away if you are not doing well or get worse.   This information is not intended to replace advice given to you by your health care provider. Make sure you discuss any questions you have with your health care provider.   Document Released: 07/31/2006 Document Revised: 04/20/2013 Document Reviewed: 10/21/2012 Elsevier Interactive Patient Education [employer].

## 2015-03-04 NOTE — ED Notes (Signed)
RT in.

## 2015-03-04 NOTE — ED Notes (Signed)
RT aware

## 2015-03-04 NOTE — ED Notes (Signed)
Pt reports having increase in asthma symptoms and no relief with inhalers. Speaking in full sentences and spo2 99%.

## 2015-03-04 NOTE — ED Provider Notes (Signed)
CSN: [id number]     Arrival date & time 03/04/15  4098 History  By signing my name below, I, April [name], attest that this documentation has been prepared under the direction and in the presence of [employer], PA-C. Electronically Signed: Rayna [name], ED Scribe. 03/04/2015. 9:26 AM.   Chief Complaint  Patient presents with  . Asthma   The history is provided by the patient. No language interpreter was used.   HPI HPI Comments: April [name] is a 44 y.o. female with a hx of COPD and Asthma who presents to the Emergency Department complaining of constant, moderate, SOB with onset 1 day ago. Pt confirms being seen at [facility] a few weeks ago further noting the treatment plan provided relief of her symptoms. She notes associated, mild, cough, rhinorrhea and congestion. Pt notes using her inhaler at home as needed with her last use at 6:00 am and denies any relief. Pt notes using Xolair in addition to her inhaler as needed. She confirms sick contacts in her household. She confirms having a PCP. Pt requested IM steroid instead of PO option. She denies any other associated symptoms at this time.  No leg swelling, estrogen usage, history of DVT, hemoptysis.  Past Medical History  Diagnosis Date  . Acanthosis nigricans   . Menorrhagia   . Hypertension, essential   . Insomnia   . Morbid obesity ([place])   . Helicobacter pylori (H. pylori) infection   . Sleep apnea     Sleep study 2008 - mild OSA, not enough events to titrate CPAP  . COPD (chronic obstructive pulmonary disease) ([place])     PFTs in 2002, FEV1/FVC 65, no post bronchodilater test done  . Asthma     Followed by Dr. Annamaria Boots (pulmonology); receives every other week omalizumab injections; has frequent exacerbations  . Depression   . GERD (gastroesophageal reflux disease)   . Tobacco user   . Obesity   . Shortness of breath   . Headache(784.0)   . Seasonal allergies    Past Surgical History  Procedure Laterality Date  .  Tubal ligation  1996    bilateral  . Breast reduction surgery  09/2011   Family History  Problem Relation Age of Onset  . Asthma Daughter   . Cancer Paternal Aunt   . Asthma Maternal Grandmother   . Hypertension Mother    Social History  Substance Use Topics  . Smoking status: Former Smoker -- 18 years    Types: Cigarettes    Quit date: 09/15/2014  . Smokeless tobacco: Former Systems developer    Quit date: 09/12/2014  . Alcohol Use: No   OB History    No data available     Review of Systems  Constitutional: Negative for fever, chills, appetite change and fatigue.  HENT: Positive for congestion, postnasal drip, rhinorrhea and sinus pressure. Negative for ear discharge, ear pain, mouth sores and sore throat.   Eyes: Negative for visual disturbance.  Respiratory: Positive for cough, chest tightness, shortness of breath and wheezing. Negative for stridor.   Cardiovascular: Negative for chest pain, palpitations and leg swelling.  Gastrointestinal: Negative for nausea, vomiting, abdominal pain and diarrhea.  Genitourinary: Negative for dysuria, urgency, frequency and hematuria.  Musculoskeletal: Negative for myalgias, back pain, arthralgias and neck stiffness.  Skin: Negative for rash.  Neurological: Negative for syncope, light-headedness, numbness and headaches.  Hematological: Negative for adenopathy.  Psychiatric/Behavioral: The patient is not nervous/anxious.   All other systems reviewed and are negative.  Allergies  Review of patient's allergies indicates no known allergies.  Home Medications   Prior to Admission medications   Medication Sig Start Date End Date Taking? Authorizing Provider  albuterol (PROVENTIL HFA;VENTOLIN HFA) 108 (90 BASE) MCG/ACT inhaler Inhale 1-2 puffs into the lungs every 4 (four) hours as needed for wheezing or shortness of breath. Patient needs appointment before new Rx can be given 01/13/15   Arnoldo Morale, MD  albuterol (PROVENTIL) (2.5 MG/3ML) 0.083%  nebulizer solution Take 3 mLs (2.5 mg total) by nebulization every 6 (six) hours as needed for wheezing or shortness of breath. 11/18/14   Arnoldo Morale, MD  fexofenadine (ALLEGRA) 60 MG tablet Take 1 tablet (60 mg total) by mouth 2 (two) times daily. 02/08/15   Tatyana Kirichenko, PA-C  ipratropium (ATROVENT) 0.02 % nebulizer solution Take 2.5 mLs (0.5 mg total) by nebulization 4 (four) times daily as needed for wheezing or shortness of breath. 11/18/14   Arnoldo Morale, MD  lisinopril-hydrochlorothiazide (PRINZIDE,ZESTORETIC) 10-12.5 MG per tablet Take 1 tablet by mouth daily. 11/18/14   Arnoldo Morale, MD  mometasone-formoterol (DULERA) 200-5 MCG/ACT AERO Inhale 2 puffs into the lungs 2 (two) times daily. Rinse mouth after inhalation 09/09/14   [name], MD  omalizumab Arvid Right) 150 MG injection Inject 150 mg into the skin every 14 (fourteen) days.    Historical Provider, MD  predniSONE (DELTASONE) 10 MG tablet Take 4 tablets (40 mg total) by mouth daily. 03/04/15   Jontavious Commons, PA-C  ranitidine (ZANTAC) 150 MG tablet Take 2 tablets (300 mg total) by mouth at bedtime. Patient taking differently: Take 300 mg by mouth every morning.  11/03/14   [name], MD  sertraline (ZOLOFT) 50 MG tablet Take 1 tablet (50 mg total) by mouth daily. 11/18/14   Arnoldo Morale, MD  zolpidem (AMBIEN) 10 MG tablet Take 10 mg by mouth at bedtime as needed for sleep.    Historical Provider, MD   BP 184/92 mmHg  Pulse 100  Temp(Src) 98.1 F (36.7 C) (Oral)  Resp 20  SpO2 97%  LMP 02/18/2015 Physical Exam  Constitutional: She is oriented to person, place, and time. She appears well-developed and well-nourished. No distress.  HENT:  Head: Normocephalic and atraumatic.  Right Ear: Tympanic membrane, external ear and ear canal normal.  Left Ear: Tympanic membrane, external ear and ear canal normal.  Nose: Mucosal edema and rhinorrhea present. No epistaxis. Right sinus exhibits no maxillary sinus tenderness and no  frontal sinus tenderness. Left sinus exhibits no maxillary sinus tenderness and no frontal sinus tenderness.  Mouth/Throat: Uvula is midline and mucous membranes are normal. Mucous membranes are not pale and not cyanotic. No oropharyngeal exudate, posterior oropharyngeal edema, posterior oropharyngeal erythema or tonsillar abscesses.  Eyes: Conjunctivae are normal. Pupils are equal, round, and reactive to light.  Neck: Normal range of motion and full passive range of motion without pain.  Cardiovascular: Normal rate and intact distal pulses.   Pulmonary/Chest: Effort normal. No stridor. She has wheezes.  Diffuse wheezing throughout  Abdominal: Soft. Bowel sounds are normal. There is no tenderness.  Musculoskeletal: Normal range of motion.  Lymphadenopathy:    She has no cervical adenopathy.  Neurological: She is alert and oriented to person, place, and time.  Skin: Skin is warm and dry. No rash noted. She is not diaphoretic.  Psychiatric: She has a normal mood and affect.  Nursing note and vitals reviewed.  ED Course  Procedures  DIAGNOSTIC STUDIES: Oxygen Saturation is 99% on RA, normal by my interpretation.  COORDINATION OF CARE: 9:10 AM Pt presents today due to an asthma flare up. Discussed treatment plan with pt at bedside including an albuterol treatment, Atrovent nebulizer and decadron injection and reevaluation based on results. Pt agreed to plan.  10:38 AM Pt reevaluated and continues to wheeze throughout with diminished air movement. Pt reports some improvement. Will repeat hour long nebulizer treatment.   Labs Review Labs Reviewed - No data to display  Imaging Review No results found.   EKG Interpretation None     MDM   Final diagnoses:  Asthma exacerbation  Viral URI with cough   Jakiya N Shonteria Abeln of asthma exacerbation. Patient has been seen by her primary care for this and multiple times in the emergency department. She reports that her granddaughter is sick  and has had URI symptoms including cough and rhinorrhea as well. No fevers. No focal sounds on exam. Doubt pneumonia. Will give one hour albuterol and reassess.  12:34 PM Patient with clear and equal breath sounds after completing half of the second CAT.  She reports she is breathing at baseline.  Patient ambulated in ED with O2 saturations maintained >90, no current signs of respiratory distress. Lung exam improved after nebulizer treatment. Decadron given in the ED per patient request and pt will be dc with 5 day burst.  Pt has been instructed to continue using prescribed medications and to speak with PCP about today's exacerbation. Rescue inhaler given at discharge.    BP 184/92 mmHg  Pulse 100  Temp(Src) 98.1 F (36.7 C) (Oral)  Resp 20  SpO2 97%  LMP 02/18/2015  I personally performed the services described in this documentation, which was scribed in my presence. The recorded information has been reviewed and is accurate.   Jarrett Soho Floella Ensz, PA-C 03/04/15 1354  Fredia Sorrow, MD 03/05/15 [phone]

## 2015-03-07 ENCOUNTER — Ambulatory Visit: Payer: Self-pay

## 2015-03-07 ENCOUNTER — Ambulatory Visit (INDEPENDENT_AMBULATORY_CARE_PROVIDER_SITE_OTHER): Payer: Self-pay

## 2015-03-07 DIAGNOSIS — J449 Chronic obstructive pulmonary disease, unspecified: Secondary | ICD-10-CM

## 2015-03-07 DIAGNOSIS — Z23 Encounter for immunization: Secondary | ICD-10-CM

## 2015-03-07 DIAGNOSIS — J4520 Mild intermittent asthma, uncomplicated: Secondary | ICD-10-CM

## 2015-03-07 MED ORDER — OMALIZUMAB 150 MG ~~LOC~~ SOLR
225.0000 mg | Freq: Once | SUBCUTANEOUS | Status: AC
  Administered 2015-03-07: 225 mg via SUBCUTANEOUS

## 2015-03-10 ENCOUNTER — Emergency Department (HOSPITAL_COMMUNITY): Payer: Self-pay

## 2015-03-10 ENCOUNTER — Emergency Department (HOSPITAL_COMMUNITY)
Admission: EM | Admit: 2015-03-10 | Discharge: 2015-03-10 | Disposition: A | Discharge status: Home / Self Care | Payer: Self-pay | Attending: Emergency Medicine | Admitting: Emergency Medicine

## 2015-03-10 ENCOUNTER — Encounter (HOSPITAL_COMMUNITY): Payer: Self-pay | Admitting: Emergency Medicine

## 2015-03-10 DIAGNOSIS — Z7951 Long term (current) use of inhaled steroids: Secondary | ICD-10-CM | POA: Insufficient documentation

## 2015-03-10 DIAGNOSIS — J449 Chronic obstructive pulmonary disease, unspecified: Secondary | ICD-10-CM | POA: Insufficient documentation

## 2015-03-10 DIAGNOSIS — J45909 Unspecified asthma, uncomplicated: Secondary | ICD-10-CM

## 2015-03-10 DIAGNOSIS — Z79899 Other long term (current) drug therapy: Secondary | ICD-10-CM | POA: Insufficient documentation

## 2015-03-10 DIAGNOSIS — I10 Essential (primary) hypertension: Secondary | ICD-10-CM | POA: Insufficient documentation

## 2015-03-10 DIAGNOSIS — K219 Gastro-esophageal reflux disease without esophagitis: Secondary | ICD-10-CM | POA: Insufficient documentation

## 2015-03-10 DIAGNOSIS — F329 Major depressive disorder, single episode, unspecified: Secondary | ICD-10-CM | POA: Insufficient documentation

## 2015-03-10 DIAGNOSIS — Z87891 Personal history of nicotine dependence: Secondary | ICD-10-CM | POA: Insufficient documentation

## 2015-03-10 DIAGNOSIS — J45901 Unspecified asthma with (acute) exacerbation: Secondary | ICD-10-CM | POA: Insufficient documentation

## 2015-03-10 DIAGNOSIS — G4700 Insomnia, unspecified: Secondary | ICD-10-CM | POA: Insufficient documentation

## 2015-03-10 DIAGNOSIS — Z8619 Personal history of other infectious and parasitic diseases: Secondary | ICD-10-CM | POA: Insufficient documentation

## 2015-03-10 DIAGNOSIS — Z872 Personal history of diseases of the skin and subcutaneous tissue: Secondary | ICD-10-CM | POA: Insufficient documentation

## 2015-03-10 DIAGNOSIS — Z8742 Personal history of other diseases of the female genital tract: Secondary | ICD-10-CM | POA: Insufficient documentation

## 2015-03-10 DIAGNOSIS — Z7952 Long term (current) use of systemic steroids: Secondary | ICD-10-CM | POA: Insufficient documentation

## 2015-03-10 LAB — BASIC METABOLIC PANEL WITH GFR
Anion gap: 11 (ref 5–15)
BUN: 12 mg/dL (ref 6–20)
CO2: 27 mmol/L (ref 22–32)
Calcium: 8.4 mg/dL — ABNORMAL LOW (ref 8.9–10.3)
Chloride: 101 mmol/L (ref 101–111)
Creatinine, Ser: 0.67 mg/dL (ref 0.44–1.00)
GFR calc Af Amer: >60 mL/min
GFR calc non Af Amer: >60 mL/min
Glucose, Bld: 104 mg/dL — ABNORMAL HIGH (ref 65–99)
Potassium: 3.3 mmol/L — ABNORMAL LOW (ref 3.5–5.1)
Sodium: 139 mmol/L (ref 135–145)

## 2015-03-10 LAB — CBC WITH DIFFERENTIAL/PLATELET
Basophils Absolute: 0 10*3/uL (ref 0.0–0.1)
Basophils Relative: 0 %
Eosinophils Absolute: 0 10*3/uL (ref 0.0–0.7)
Eosinophils Relative: 0 %
HCT: 36.6 % (ref 36.0–46.0)
Hemoglobin: 11.9 g/dL — ABNORMAL LOW (ref 12.0–15.0)
Lymphocytes Relative: 11 %
Lymphs Abs: 1.9 10*3/uL (ref 0.7–4.0)
MCH: 27.3 pg (ref 26.0–34.0)
MCHC: 32.5 g/dL (ref 30.0–36.0)
MCV: 83.9 fL (ref 78.0–100.0)
Monocytes Absolute: 1.5 10*3/uL — ABNORMAL HIGH (ref 0.1–1.0)
Monocytes Relative: 9 %
Neutro Abs: 13.5 10*3/uL — ABNORMAL HIGH (ref 1.7–7.7)
Neutrophils Relative %: 80 %
Platelets: 416 10*3/uL — ABNORMAL HIGH (ref 150–400)
RBC: 4.36 MIL/uL (ref 3.87–5.11)
RDW: 16.3 % — ABNORMAL HIGH (ref 11.5–15.5)
WBC: 17 10*3/uL — ABNORMAL HIGH (ref 4.0–10.5)

## 2015-03-10 MED ORDER — ALBUTEROL (5 MG/ML) CONTINUOUS INHALATION SOLN
15.0000 mg/h | INHALATION_SOLUTION | RESPIRATORY_TRACT | Status: AC
  Administered 2015-03-10: 15 mg/h via RESPIRATORY_TRACT
  Filled 2015-03-10: qty 20

## 2015-03-10 MED ORDER — METHYLPREDNISOLONE SODIUM SUCC 125 MG IJ SOLR
125.0000 mg | INTRAMUSCULAR | Status: AC
  Administered 2015-03-10: 125 mg via INTRAVENOUS
  Filled 2015-03-10: qty 2

## 2015-03-10 MED ORDER — IPRATROPIUM-ALBUTEROL 0.5-2.5 (3) MG/3ML IN SOLN
3.0000 mL | Freq: Once | RESPIRATORY_TRACT | Status: AC
  Administered 2015-03-10: 3 mL via RESPIRATORY_TRACT
  Filled 2015-03-10: qty 3

## 2015-03-10 MED ORDER — MAGNESIUM SULFATE 2 GM/50ML IV SOLN
2.0000 g | INTRAVENOUS | Status: AC
  Administered 2015-03-10: 2 g via INTRAVENOUS
  Filled 2015-03-10: qty 50

## 2015-03-10 MED ORDER — IPRATROPIUM BROMIDE 0.02 % IN SOLN
0.5000 mg | RESPIRATORY_TRACT | Status: AC
  Administered 2015-03-10: 0.5 mg via RESPIRATORY_TRACT
  Filled 2015-03-10: qty 2.5

## 2015-03-10 NOTE — ED Notes (Signed)
Pt c/o asthma symptoms onset Monday after getting flu shot. Pt reports that she had asthma symptoms on Saturday and came here. Pt felt better until Monday.

## 2015-03-10 NOTE — Discharge Instructions (Signed)
Return here as needed. FOllow up at the [facility].

## 2015-03-10 NOTE — ED Provider Notes (Signed)
CSN: [id number]     Arrival date & time 03/10/15  0725 History   First MD Initiated Contact with Patient 03/10/15 0750     Chief Complaint  Patient presents with  . Asthma     (Consider location/radiation/quality/duration/timing/severity/associated sxs/prior Treatment) HPI Patient presents to the emergency department with wheezing.  The patient's been seen multiple times recently for this wheezing.  She states that she needs further treatment and care for her asthma.  Patient states that nothing seems to make her condition better or worse.  She states that she was here several days ago and does not feel any better.  The patient states that he does not have a primary care doctor that she sees for this.  Patient denies chest pain, weakness, dizziness, headache, blurred vision, back pain, neck pain, fever, cough, runny nose, sore throat, or syncope.  The patient states that she did not take any other medications prior to arrival Past Medical History  Diagnosis Date  . Acanthosis nigricans   . Menorrhagia   . Hypertension, essential   . Insomnia   . Morbid obesity ([place])   . Helicobacter pylori (H. pylori) infection   . Sleep apnea     Sleep study 2008 - mild OSA, not enough events to titrate CPAP  . COPD (chronic obstructive pulmonary disease) ([place])     PFTs in 2002, FEV1/FVC 65, no post bronchodilater test done  . Asthma     Followed by Dr. Annamaria Boots (pulmonology); receives every other week omalizumab injections; has frequent exacerbations  . Depression   . GERD (gastroesophageal reflux disease)   . Tobacco user   . Obesity   . Shortness of breath   . Headache(784.0)   . Seasonal allergies    Past Surgical History  Procedure Laterality Date  . Tubal ligation  1996    bilateral  . Breast reduction surgery  09/2011   Family History  Problem Relation Age of Onset  . Asthma Daughter   . Cancer Paternal Aunt   . Asthma Maternal Grandmother   . Hypertension Mother    Social History   Substance Use Topics  . Smoking status: Former Smoker -- 18 years    Types: Cigarettes    Quit date: 09/15/2014  . Smokeless tobacco: Former Systems developer    Quit date: 09/12/2014  . Alcohol Use: No   OB History    No data available     Review of Systems  All other systems negative except as documented in the HPI. All pertinent positives and negatives as reviewed in the HPI.  Allergies  Review of patient's allergies indicates no known allergies.  Home Medications   Prior to Admission medications   Medication Sig Start Date End Date Taking? Authorizing Provider  albuterol (PROVENTIL HFA;VENTOLIN HFA) 108 (90 BASE) MCG/ACT inhaler Inhale 1-2 puffs into the lungs every 4 (four) hours as needed for wheezing or shortness of breath. Patient needs appointment before new Rx can be given 01/13/15  Yes Arnoldo Morale, MD  albuterol (PROVENTIL) (2.5 MG/3ML) 0.083% nebulizer solution Take 3 mLs (2.5 mg total) by nebulization every 6 (six) hours as needed for wheezing or shortness of breath. 11/18/14  Yes Arnoldo Morale, MD  fexofenadine (ALLEGRA) 60 MG tablet Take 1 tablet (60 mg total) by mouth 2 (two) times daily. 02/08/15  Yes Tatyana Kirichenko, PA-C  ipratropium (ATROVENT) 0.02 % nebulizer solution Take 2.5 mLs (0.5 mg total) by nebulization 4 (four) times daily as needed for wheezing or shortness of breath. 11/18/14  Yes Arnoldo Morale, MD  lisinopril-hydrochlorothiazide (PRINZIDE,ZESTORETIC) 10-12.5 MG per tablet Take 1 tablet by mouth daily. 11/18/14  Yes Arnoldo Morale, MD  mometasone-formoterol (DULERA) 200-5 MCG/ACT AERO Inhale 2 puffs into the lungs 2 (two) times daily. Rinse mouth after inhalation 09/09/14  Yes [name], MD  omalizumab Arvid Right) 150 MG injection Inject 150 mg into the skin every 14 (fourteen) days.   Yes Historical Provider, MD  predniSONE (DELTASONE) 10 MG tablet Take 4 tablets (40 mg total) by mouth daily. 03/04/15  Yes Hannah Muthersbaugh, PA-C  ranitidine (ZANTAC) 150 MG tablet  Take 2 tablets (300 mg total) by mouth at bedtime. Patient taking differently: Take 300 mg by mouth every morning.  11/03/14  Yes [name], MD  sertraline (ZOLOFT) 50 MG tablet Take 1 tablet (50 mg total) by mouth daily. 11/18/14  Yes Arnoldo Morale, MD  zolpidem (AMBIEN) 10 MG tablet Take 10 mg by mouth at bedtime as needed for sleep.   Yes Historical Provider, MD   BP 195/113 mmHg  Pulse 111  Temp(Src) 99.1 F (37.3 C) (Oral)  Resp 17  Ht 5\' 6"  (1.676 m)  Wt 315 lb (142.883 kg)  BMI 50.87 kg/m2  SpO2 99%  LMP 03/01/2015 Physical Exam  Constitutional: She is oriented to person, place, and time. She appears well-developed and well-nourished. No distress.  HENT:  Head: Normocephalic and atraumatic.  Mouth/Throat: Oropharynx is clear and moist.  Eyes: Pupils are equal, round, and reactive to light.  Neck: Normal range of motion. Neck supple.  Cardiovascular: Normal rate, regular rhythm and normal heart sounds.  Exam reveals no gallop and no friction rub.   No murmur heard. Pulmonary/Chest: Effort normal. No respiratory distress. She has wheezes.  Abdominal: Soft. Bowel sounds are normal. She exhibits no distension. There is no tenderness.  Neurological: She is alert and oriented to person, place, and time. She exhibits normal muscle tone. Coordination normal.  Skin: Skin is warm and dry. No rash noted. No erythema.  Psychiatric: She has a normal mood and affect. Her behavior is normal.  Nursing note and vitals reviewed.   ED Course  Procedures (including critical care time) Labs Review Labs Reviewed  BASIC METABOLIC PANEL - Abnormal; Notable for the following:    Potassium 3.3 (*)    Glucose, Bld 104 (*)    Calcium 8.4 (*)    All other components within normal limits  CBC WITH DIFFERENTIAL/PLATELET - Abnormal; Notable for the following:    WBC 17.0 (*)    Hemoglobin 11.9 (*)    RDW 16.3 (*)    Platelets 416 (*)    Neutro Abs 13.5 (*)    Monocytes Absolute 1.5 (*)    All  other components within normal limits    Imaging Review Dg Chest 2 View  03/10/2015  CLINICAL DATA:  Cough for 1 week, worsened today. EXAM: CHEST  2 VIEW COMPARISON:  PA and lateral chest 02/08/2015 and 03/27/2014. FINDINGS: The lungs are clear. Heart size is normal. No pneumothorax or pleural effusion. No focal bony abnormality. IMPRESSION: Negative chest. Electronically Signed   By: Inge Rise M.D.   On: 03/10/2015 09:14   I have personally reviewed and evaluated these images and lab results as part of my medical decision-making.   Patient is feeling better at this time will be discharged home.  She is advised follow-up with her primary care doctor that she has not seen for these issues recently.  Patient agrees the plan and all questions were  answered.  He should maintain good oxygen saturation while ambulating   Dalia Heading, PA-C 03/14/15 [place], MD 03/16/15 2312

## 2015-03-10 NOTE — ED Notes (Signed)
Pt ambulated in hallway, oxygen level remained above 99%, pt in NAD.

## 2015-03-12 ENCOUNTER — Encounter (HOSPITAL_COMMUNITY): Payer: Self-pay | Admitting: Emergency Medicine

## 2015-03-12 ENCOUNTER — Emergency Department (HOSPITAL_COMMUNITY)
Admission: EM | Admit: 2015-03-12 | Discharge: 2015-03-12 | Disposition: A | Discharge status: Home / Self Care | Payer: Self-pay | Attending: Physician Assistant | Admitting: Physician Assistant

## 2015-03-12 DIAGNOSIS — Z79899 Other long term (current) drug therapy: Secondary | ICD-10-CM | POA: Insufficient documentation

## 2015-03-12 DIAGNOSIS — F329 Major depressive disorder, single episode, unspecified: Secondary | ICD-10-CM | POA: Insufficient documentation

## 2015-03-12 DIAGNOSIS — G4700 Insomnia, unspecified: Secondary | ICD-10-CM | POA: Insufficient documentation

## 2015-03-12 DIAGNOSIS — I10 Essential (primary) hypertension: Secondary | ICD-10-CM | POA: Insufficient documentation

## 2015-03-12 DIAGNOSIS — Z7951 Long term (current) use of inhaled steroids: Secondary | ICD-10-CM | POA: Insufficient documentation

## 2015-03-12 DIAGNOSIS — Z87891 Personal history of nicotine dependence: Secondary | ICD-10-CM | POA: Insufficient documentation

## 2015-03-12 DIAGNOSIS — G4730 Sleep apnea, unspecified: Secondary | ICD-10-CM | POA: Insufficient documentation

## 2015-03-12 DIAGNOSIS — Z8742 Personal history of other diseases of the female genital tract: Secondary | ICD-10-CM | POA: Insufficient documentation

## 2015-03-12 DIAGNOSIS — Z872 Personal history of diseases of the skin and subcutaneous tissue: Secondary | ICD-10-CM | POA: Insufficient documentation

## 2015-03-12 DIAGNOSIS — Z8619 Personal history of other infectious and parasitic diseases: Secondary | ICD-10-CM | POA: Insufficient documentation

## 2015-03-12 DIAGNOSIS — M791 Myalgia: Secondary | ICD-10-CM | POA: Insufficient documentation

## 2015-03-12 DIAGNOSIS — K219 Gastro-esophageal reflux disease without esophagitis: Secondary | ICD-10-CM | POA: Insufficient documentation

## 2015-03-12 DIAGNOSIS — J441 Chronic obstructive pulmonary disease with (acute) exacerbation: Secondary | ICD-10-CM | POA: Insufficient documentation

## 2015-03-12 DIAGNOSIS — J45901 Unspecified asthma with (acute) exacerbation: Secondary | ICD-10-CM

## 2015-03-12 MED ORDER — IPRATROPIUM BROMIDE 0.02 % IN SOLN
0.5000 mg | Freq: Once | RESPIRATORY_TRACT | Status: AC
  Administered 2015-03-12: 0.5 mg via RESPIRATORY_TRACT
  Filled 2015-03-12 (×2): qty 2.5

## 2015-03-12 MED ORDER — ALBUTEROL SULFATE HFA 108 (90 BASE) MCG/ACT IN AERS
2.0000 | INHALATION_SPRAY | Freq: Once | RESPIRATORY_TRACT | Status: AC
  Administered 2015-03-12: 2 via RESPIRATORY_TRACT
  Filled 2015-03-12: qty 6.7

## 2015-03-12 MED ORDER — PREDNISONE 20 MG PO TABS: 40.0000 mg | ORAL_TABLET | Freq: Every day | ORAL | Status: DC

## 2015-03-12 MED ORDER — ALBUTEROL SULFATE (2.5 MG/3ML) 0.083% IN NEBU
5.0000 mg | INHALATION_SOLUTION | Freq: Once | RESPIRATORY_TRACT | Status: AC
  Administered 2015-03-12: 5 mg via RESPIRATORY_TRACT
  Filled 2015-03-12 (×2): qty 6

## 2015-03-12 MED ORDER — NAPROXEN 250 MG PO TABS: 250.0000 mg | ORAL_TABLET | Freq: Two times a day (BID) | ORAL | Status: DC

## 2015-03-12 MED ORDER — ALBUTEROL (5 MG/ML) CONTINUOUS INHALATION SOLN
10.0000 mg/h | INHALATION_SOLUTION | RESPIRATORY_TRACT | Status: DC
  Administered 2015-03-12: 10 mg/h via RESPIRATORY_TRACT
  Filled 2015-03-12: qty 20

## 2015-03-12 MED ORDER — AZITHROMYCIN 250 MG PO TABS: 250.0000 mg | ORAL_TABLET | Freq: Every day | ORAL | Status: DC

## 2015-03-12 MED ORDER — PREDNISONE 20 MG PO TABS
60.0000 mg | ORAL_TABLET | Freq: Once | ORAL | Status: AC
  Administered 2015-03-12: 60 mg via ORAL
  Filled 2015-03-12: qty 3

## 2015-03-12 NOTE — ED Notes (Signed)
PA at bedside.

## 2015-03-12 NOTE — ED Notes (Signed)
Pt c/o asthma symptoms onset 1 week. Pt was seen here Friday for same. Pt was not given any new prescriptions when she was discharged

## 2015-03-12 NOTE — Discharge Instructions (Signed)
Asthma Attack Prevention °While you may not be able to control the fact that you have asthma, you can take actions to prevent asthma attacks. The best way to prevent asthma attacks is to maintain good control of your asthma. You can achieve this by: °· Taking your medicines as directed. °· Avoiding things that can irritate your airways or make your asthma symptoms worse (asthma triggers). °· Keeping track of how well your asthma is controlled and of any changes in your symptoms. °· Responding quickly to worsening asthma symptoms (asthma attack). °· Seeking emergency care when it is needed. °WHAT ARE SOME WAYS TO PREVENT AN ASTHMA ATTACK? °Have a Plan °Work with your health care provider to create a written plan for managing and treating your asthma attacks (asthma action plan). This plan includes: °· A list of your asthma triggers and how you can avoid them. °· Information on when medicines should be taken and when their dosages should be changed. °· The use of a device that measures how well your lungs are working (peak flow meter). °Monitor Your Asthma °Use your peak flow meter and record your results in a journal every day. A drop in your peak flow numbers on one or more days may indicate the start of an asthma attack. This can happen even before you start to feel symptoms. You can prevent an asthma attack from getting worse by following the steps in your asthma action plan. °Avoid Asthma Triggers °Work with your asthma health care provider to find out what your asthma triggers are. This can be done by: °· Allergy testing. °· Keeping a journal that notes when asthma attacks occur and the factors that may have contributed to them. °· Determining if there are other medical conditions that are making your asthma worse. °Once you have determined your asthma triggers, take steps to avoid them. This may include avoiding excessive or prolonged exposure to: °· Dust. Have someone dust and vacuum your home for you once or  twice a week. Using a high-efficiency particulate arrestance (HEPA) vacuum is best. °· Smoke. This includes campfire smoke, forest fire smoke, and secondhand smoke from tobacco products. °· Pet dander. Avoid contact with animals that you know you are allergic to. °· Allergens from trees, grasses or pollens. Avoid spending a lot of time outdoors when pollen counts are high, and on very windy days. °· Very cold, dry, or humid air. °· Mold. °· Foods that contain high amounts of sulfites. °· Strong odors. °· Outdoor air pollutants, such as engine exhaust. °· Indoor air pollutants, such as aerosol sprays and fumes from household cleaners. °· Household pests, including dust mites and cockroaches, and pest droppings. °· Certain medicines, including NSAIDs. Always talk to your health care provider before stopping or starting any new medicines. °Medicines °Take over-the-counter and prescription medicines only as told by your health care provider. Many asthma attacks can be prevented by carefully following your medicine schedule. Taking your medicines correctly is especially important when you cannot avoid certain asthma triggers. °Act Quickly °If an asthma attack does happen, acting quickly can decrease how severe it is and how long it lasts. Take these steps:  °· Pay attention to your symptoms. If you are coughing, wheezing, or having difficulty breathing, do not wait to see if your symptoms go away on their own. Follow your asthma action plan. °· If you have followed your asthma action plan and your symptoms are not improving, call your health care provider or seek immediate medical care   at the nearest hospital. °It is important to note how often you need to use your fast-acting rescue inhaler. If you are using your rescue inhaler more often, it may mean that your asthma is not under control. Adjusting your asthma treatment plan may help you to prevent future asthma attacks and help you to gain better control of your  condition. °HOW CAN I PREVENT AN ASTHMA ATTACK WHEN I EXERCISE? °Follow advice from your health care provider about whether you should use your fast-acting inhaler before exercising. Many people with asthma experience exercise-induced bronchoconstriction (EIB). This condition often worsens during vigorous exercise in cold, humid, or dry environments. Usually, people with EIB can stay very active by pre-treating with a fast-acting inhaler before exercising. °  °This information is not intended to replace advice given to you by your health care provider. Make sure you discuss any questions you have with your health care provider. °  °Document Released: 04/03/2009 Document Revised: 01/04/2015 Document Reviewed: 09/15/2014 °Elsevier Interactive Patient Education ©2016 [employer] ° °Asthma, Adult °Asthma is a recurring condition in which the airways tighten and narrow. Asthma can make it difficult to breathe. It can cause coughing, wheezing, and shortness of breath. Asthma episodes, also called asthma attacks, range from minor to life-threatening. Asthma cannot be cured, but medicines and lifestyle changes can help control it. °CAUSES °Asthma is believed to be caused by inherited (genetic) and environmental factors, but its exact cause is unknown. Asthma may be triggered by allergens, lung infections, or irritants in the air. Asthma triggers are different for each person. Common triggers include:  °· Animal dander. °· Dust mites. °· Cockroaches. °· Pollen from trees or grass. °· Mold. °· Smoke. °· Air pollutants such as dust, household cleaners, hair sprays, aerosol sprays, paint fumes, strong chemicals, or strong odors. °· Cold air, weather changes, and winds (which increase molds and pollens in the air). °· Strong emotional expressions such as crying or laughing hard. °· Stress. °· Certain medicines (such as aspirin) or types of drugs (such as beta-blockers). °· Sulfites in foods and drinks. Foods and drinks that  may contain sulfites include dried fruit, potato chips, and sparkling grape juice. °· Infections or inflammatory conditions such as the flu, a cold, or an inflammation of the nasal membranes (rhinitis). °· Gastroesophageal reflux disease (GERD). °· Exercise or strenuous activity. °SYMPTOMS °Symptoms may occur immediately after asthma is triggered or many hours later. Symptoms include: °· Wheezing. °· Excessive nighttime or early morning coughing. °· Frequent or severe coughing with a common cold. °· Chest tightness. °· Shortness of breath. °DIAGNOSIS  °The diagnosis of asthma is made by a review of your medical history and a physical exam. Tests may also be performed. These may include: °· Lung function studies. These tests show how much air you breathe in and out. °· Allergy tests. °· Imaging tests such as X-rays. °TREATMENT  °Asthma cannot be cured, but it can usually be controlled. Treatment involves identifying and avoiding your asthma triggers. It also involves medicines. There are 2 classes of medicine used for asthma treatment:  °· Controller medicines. These prevent asthma symptoms from occurring. They are usually taken every day. °· Reliever or rescue medicines. These quickly relieve asthma symptoms. They are used as needed and provide short-term relief. °Your health care provider will help you create an asthma action plan. An asthma action plan is a written plan for managing and treating your asthma attacks. It includes a list of your asthma triggers and how they   may be avoided. It also includes information on when medicines should be taken and when their dosage should be changed. An action plan may also involve the use of a device called a peak flow meter. A peak flow meter measures how well the lungs are working. It helps you monitor your condition. °HOME CARE INSTRUCTIONS  °· Take medicines only as directed by your health care provider. Speak with your health care provider if you have questions about  how or when to take the medicines. °· Use a peak flow meter as directed by your health care provider. Record and keep track of readings. °· Understand and use the action plan to help minimize or stop an asthma attack without needing to seek medical care. °· Control your home environment in the following ways to help prevent asthma attacks: °¨ Do not smoke. Avoid being exposed to secondhand smoke. °¨ Change your heating and air conditioning filter regularly. °¨ Limit your use of fireplaces and wood stoves. °¨ Get rid of pests (such as roaches and mice) and their droppings. °¨ Throw away plants if you see mold on them. °¨ Clean your floors and dust regularly. Use unscented cleaning products. °¨ Try to have someone else vacuum for you regularly. Stay out of rooms while they are being vacuumed and for a short while afterward. If you vacuum, use a dust mask from a hardware store, a double-layered or microfilter vacuum cleaner bag, or a vacuum cleaner with a HEPA filter. °¨ Replace carpet with wood, tile, or vinyl flooring. Carpet can trap dander and dust. °¨ Use allergy-proof pillows, mattress covers, and box spring covers. °¨ Wash bed sheets and blankets every week in hot water and dry them in a dryer. °¨ Use blankets that are made of polyester or cotton. °¨ Clean bathrooms and kitchens with bleach. If possible, have someone repaint the walls in these rooms with mold-resistant paint. Keep out of the rooms that are being cleaned and painted. °¨ Wash hands frequently. °SEEK MEDICAL CARE IF:  °· You have wheezing, shortness of breath, or a cough even if taking medicine to prevent attacks. °· The colored mucus you cough up (sputum) is thicker than usual. °· Your sputum changes from clear or white to yellow, green, gray, or bloody. °· You have any problems that may be related to the medicines you are taking (such as a rash, itching, swelling, or trouble breathing). °· You are using a reliever medicine more than 2-3 times per  week. °· Your peak flow is still at 50-79% of your personal best after following your action plan for 1 hour. °· You have a fever. °SEEK IMMEDIATE MEDICAL CARE IF:  °· You seem to be getting worse and are unresponsive to treatment during an asthma attack. °· You are short of breath even at rest. °· You get short of breath when doing very little physical activity. °· You have difficulty eating, drinking, or talking due to asthma symptoms. °· You develop chest pain. °· You develop a fast heartbeat. °· You have a bluish color to your lips or fingernails. °· You are light-headed, dizzy, or faint. °· Your peak flow is less than 50% of your personal best. °  °This information is not intended to replace advice given to you by your health care provider. Make sure you discuss any questions you have with your health care provider. °  °Document Released: 04/15/2005 Document Revised: 01/04/2015 Document Reviewed: 11/12/2012 °Elsevier Interactive Patient Education ©2016 [employer] ° °

## 2015-03-12 NOTE — ED Notes (Signed)
Ambulated pt in hallway while checking O2. Pts O2 remained at 100% with good waveform. Nurse was notified.

## 2015-03-12 NOTE — ED Provider Notes (Signed)
CSN: [id number]     Arrival date & time 03/12/15  1107 History   First MD Initiated Contact with Patient 03/12/15 1139     Chief Complaint  Patient presents with  . Asthma   April [name] is a 44 y.o. female with a history of hypertension, COPD, asthma, tobacco abuse, and obesity who presents to the emergency department complaining of asthma exacerbation symptoms for the past week. She reports wheezing, shortness of breath and slight cough ongoing for the past week. She reports she has been using her albuterol inhaler every 2 hours. She also reports taking maintenance inhaler. She also claims a body aches and headache. Patient was seen in the emergency department 2 days ago and had a normal chest x-ray at that time. Patient denies fevers, chills, abdominal pain, nausea, vomiting, diarrhea, hemoptysis, chest pain, palpitations, or syncope.   (Consider location/radiation/quality/duration/timing/severity/associated sxs/prior Treatment) HPI  Past Medical History  Diagnosis Date  . Acanthosis nigricans   . Menorrhagia   . Hypertension, essential   . Insomnia   . Morbid obesity ([place])   . Helicobacter pylori (H. pylori) infection   . Sleep apnea     Sleep study 2008 - mild OSA, not enough events to titrate CPAP  . COPD (chronic obstructive pulmonary disease) ([place])     PFTs in 2002, FEV1/FVC 65, no post bronchodilater test done  . Asthma     Followed by Dr. Annamaria Boots (pulmonology); receives every other week omalizumab injections; has frequent exacerbations  . Depression   . GERD (gastroesophageal reflux disease)   . Tobacco user   . Obesity   . Shortness of breath   . Headache(784.0)   . Seasonal allergies    Past Surgical History  Procedure Laterality Date  . Tubal ligation  1996    bilateral  . Breast reduction surgery  09/2011   Family History  Problem Relation Age of Onset  . Asthma Daughter   . Cancer Paternal Aunt   . Asthma Maternal Grandmother   . Hypertension Mother     Social History  Substance Use Topics  . Smoking status: Former Smoker -- 18 years    Types: Cigarettes    Quit date: 09/15/2014  . Smokeless tobacco: Former Systems developer    Quit date: 09/12/2014  . Alcohol Use: No   OB History    No data available     Review of Systems  Constitutional: Negative for fever and chills.  HENT: Negative for congestion and sore throat.   Eyes: Negative for visual disturbance.  Respiratory: Positive for cough, chest tightness, shortness of breath and wheezing.   Cardiovascular: Negative for chest pain, palpitations and leg swelling.  Gastrointestinal: Negative for nausea, vomiting and abdominal pain.  Genitourinary: Negative for dysuria.  Musculoskeletal: Positive for myalgias. Negative for back pain and neck pain.  Skin: Negative for rash.  Neurological: Negative for syncope and headaches.      Allergies  Review of patient's allergies indicates no known allergies.  Home Medications   Prior to Admission medications   Medication Sig Start Date End Date Taking? Authorizing Provider  albuterol (PROVENTIL HFA;VENTOLIN HFA) 108 (90 BASE) MCG/ACT inhaler Inhale 1-2 puffs into the lungs every 4 (four) hours as needed for wheezing or shortness of breath. Patient needs appointment before new Rx can be given 01/13/15  Yes Arnoldo Morale, MD  albuterol (PROVENTIL) (2.5 MG/3ML) 0.083% nebulizer solution Take 3 mLs (2.5 mg total) by nebulization every 6 (six) hours as needed for wheezing or shortness of  breath. 11/18/14  Yes Arnoldo Morale, MD  fexofenadine (ALLEGRA) 60 MG tablet Take 1 tablet (60 mg total) by mouth 2 (two) times daily. 02/08/15  Yes Tatyana Kirichenko, PA-C  ipratropium (ATROVENT) 0.02 % nebulizer solution Take 2.5 mLs (0.5 mg total) by nebulization 4 (four) times daily as needed for wheezing or shortness of breath. 11/18/14  Yes Arnoldo Morale, MD  lisinopril-hydrochlorothiazide (PRINZIDE,ZESTORETIC) 10-12.5 MG per tablet Take 1 tablet by mouth daily.  11/18/14  Yes Arnoldo Morale, MD  mometasone-formoterol (DULERA) 200-5 MCG/ACT AERO Inhale 2 puffs into the lungs 2 (two) times daily. Rinse mouth after inhalation 09/09/14  Yes [name], MD  omalizumab Arvid Right) 150 MG injection Inject 150 mg into the skin every 14 (fourteen) days.   Yes Historical Provider, MD  ranitidine (ZANTAC) 150 MG tablet Take 2 tablets (300 mg total) by mouth at bedtime. Patient taking differently: Take 300 mg by mouth every morning.  11/03/14  Yes [name], MD  sertraline (ZOLOFT) 50 MG tablet Take 1 tablet (50 mg total) by mouth daily. 11/18/14  Yes Arnoldo Morale, MD  zolpidem (AMBIEN) 10 MG tablet Take 10 mg by mouth at bedtime as needed for sleep.   Yes Historical Provider, MD  azithromycin (ZITHROMAX Z-PAK) 250 MG tablet Take 1 tablet (250 mg total) by mouth daily. 500mg  PO day 1, then 250mg  PO days 205 03/12/15   Waynetta Pean, PA-C  naproxen (NAPROSYN) 250 MG tablet Take 1 tablet (250 mg total) by mouth 2 (two) times daily with a meal. 03/12/15   Waynetta Pean, PA-C  predniSONE (DELTASONE) 20 MG tablet Take 2 tablets (40 mg total) by mouth daily. 03/12/15   Waynetta Pean, PA-C   BP 168/89 mmHg  Pulse 77  Temp(Src) 98 F (36.7 C) (Oral)  Resp 18  Ht 5\' 6"  (1.676 m)  Wt 315 lb (142.883 kg)  BMI 50.87 kg/m2  SpO2 100%  LMP 03/01/2015 Physical Exam  Constitutional: She appears well-developed and well-nourished. No distress.  Nontoxic appearing obese female.  HENT:  Head: Normocephalic and atraumatic.  Nose: Nose normal.  Mouth/Throat: Oropharynx is clear and moist. No oropharyngeal exudate.  Eyes: Conjunctivae are normal. Pupils are equal, round, and reactive to light. Right eye exhibits no discharge. Left eye exhibits no discharge.  Neck: Normal range of motion. Neck supple. No JVD present. No tracheal deviation present.  Cardiovascular: Normal rate, regular rhythm, normal heart sounds and intact distal pulses.   Pulmonary/Chest: Effort normal. No  respiratory distress. She has wheezes. She has no rales. She exhibits no tenderness.  Wheezing noted bilaterally diffusely. No rhonchi or crackles. No tachypnea. No increased work of breathing. Patient speaking in complete sentences. Oxygen saturation is 100% on room air.  Abdominal: Soft. There is no tenderness.  Musculoskeletal: She exhibits no edema or tenderness.  No lower extremity edema or tenderness.  Lymphadenopathy:    She has no cervical adenopathy.  Neurological: She is alert. Coordination normal.  Skin: Skin is warm and dry. No rash noted. She is not diaphoretic. No erythema. No pallor.  Psychiatric: She has a normal mood and affect. Her behavior is normal.  Nursing note and vitals reviewed.   ED Course  Procedures (including critical care time) Labs Review Labs Reviewed - No data to display  Imaging Review No results found. I have personally reviewed and evaluated these images and lab results as part of my medical decision-making.   EKG Interpretation None      Filed Vitals:   03/12/15 1415 03/12/15  1423 03/12/15 1430 03/12/15 1552  BP:  146/116 176/104 168/89  Pulse: 87 93  77  Temp:    98 F (36.7 C)  TempSrc:    Oral  Resp:  21  18  Height:      Weight:      SpO2: 100% 100%  100%     MDM   Meds given in ED:  Medications  albuterol (PROVENTIL,VENTOLIN) solution continuous neb (0 mg/hr Nebulization Stopped 03/12/15 1425)  albuterol (PROVENTIL) (2.5 MG/3ML) 0.083% nebulizer solution 5 mg (5 mg Nebulization Given 03/12/15 1244)  ipratropium (ATROVENT) nebulizer solution 0.5 mg (0.5 mg Nebulization Given 03/12/15 1244)  predniSONE (DELTASONE) tablet 60 mg (60 mg Oral Given 03/12/15 1244)  albuterol (PROVENTIL HFA;VENTOLIN HFA) 108 (90 BASE) MCG/ACT inhaler 2 puff (2 puffs Inhalation Given 03/12/15 1551)    Discharge Medication List as of 03/12/2015  3:44 PM    START taking these medications   Details  azithromycin (ZITHROMAX Z-PAK) 250 MG tablet Take  1 tablet (250 mg total) by mouth daily. 500mg  PO day 1, then 250mg  PO days 205, Starting 03/12/2015, Until Discontinued, Print    naproxen (NAPROSYN) 250 MG tablet Take 1 tablet (250 mg total) by mouth 2 (two) times daily with a meal., Starting 03/12/2015, Until Discontinued, Print    predniSONE (DELTASONE) 20 MG tablet Take 2 tablets (40 mg total) by mouth daily., Starting 03/12/2015, Until Discontinued, Print        Final diagnoses:  Asthma exacerbation   This  is a 44 y.o. female with a history of hypertension, COPD, asthma, tobacco abuse, and obesity who presents to the emergency department complaining of asthma exacerbation symptoms for the past week. She reports wheezing, shortness of breath and slight cough ongoing for the past week. She reports she has been using her albuterol inhaler every 2 hours. On examination patient is afebrile and nontoxic-appearing. She has wheezing noted diffusely. Her oxygen saturations 100% on room air. She is not tachypnea, tachycardic or hypoxic. Will provide with 500 albuterol treatment with Cipro 5 mg Atrovent and reevaluate. Patient also given 60 mg oral prednisone. Patient was seen in the emergency department 2 days ago and had a unremarkable chest x-ray at that time. I see no need for repeat chest x-ray at this time as her lungs sound clear despite wheezes. I repeat evaluation patient still has some wheezing and patient still feels short of breath. Will do continuous nebulization treatment. After 1 hour continuous albuterol treatment patient reports she no longer feels short of breath. Her repeat lung exam is much improved. Patient ambulated without hypoxia. Plan for discharge. Patient discharged with prescriptions for naproxen, prednisone burst and azithromycin. I encouraged close follow-up by primary care. I advised the patient to follow-up with their primary care provider this week. I advised the patient to return to the emergency department with new or  worsening symptoms or new concerns. The patient verbalized understanding and agreement with plan.    This patient was discussed with Dr. Thomasene Lot who agrees with assessment and plan.  CRITICAL CARE Performed by: Hanley Hays   Total critical care time: 35 minutes  Critical care time was exclusive of separately billable procedures and treating other patients.  Critical care was necessary to treat or prevent imminent or life-threatening deterioration.  Critical care was time spent personally by me on the following activities: development of treatment plan with patient and/or surrogate as well as nursing, discussions with consultants, evaluation of patient's response to treatment, examination of patient,  obtaining history from patient or surrogate, ordering and performing treatments and interventions, ordering and review of laboratory studies, ordering and review of radiographic studies, pulse oximetry and re-evaluation of patient's condition.   Waynetta Pean, PA-C 03/12/15 [place], MD 03/21/15 [id number]

## 2015-03-13 ENCOUNTER — Emergency Department (HOSPITAL_COMMUNITY): Payer: Self-pay

## 2015-03-13 ENCOUNTER — Observation Stay (HOSPITAL_COMMUNITY)
Admission: EM | Admit: 2015-03-13 | Discharge: 2015-03-14 | Disposition: A | Discharge status: Home / Self Care | Payer: Self-pay | Attending: Family Medicine | Admitting: Family Medicine

## 2015-03-13 ENCOUNTER — Encounter (HOSPITAL_COMMUNITY): Payer: Self-pay | Admitting: *Deleted

## 2015-03-13 ENCOUNTER — Telehealth: Payer: Self-pay | Admitting: Internal Medicine

## 2015-03-13 DIAGNOSIS — K219 Gastro-esophageal reflux disease without esophagitis: Secondary | ICD-10-CM

## 2015-03-13 DIAGNOSIS — F332 Major depressive disorder, recurrent severe without psychotic features: Secondary | ICD-10-CM

## 2015-03-13 DIAGNOSIS — M545 Low back pain: Secondary | ICD-10-CM

## 2015-03-13 DIAGNOSIS — D72829 Elevated white blood cell count, unspecified: Secondary | ICD-10-CM

## 2015-03-13 DIAGNOSIS — F17210 Nicotine dependence, cigarettes, uncomplicated: Secondary | ICD-10-CM | POA: Insufficient documentation

## 2015-03-13 DIAGNOSIS — Z6841 Body Mass Index (BMI) 40.0 and over, adult: Secondary | ICD-10-CM | POA: Insufficient documentation

## 2015-03-13 DIAGNOSIS — E876 Hypokalemia: Secondary | ICD-10-CM

## 2015-03-13 DIAGNOSIS — Z720 Tobacco use: Secondary | ICD-10-CM

## 2015-03-13 DIAGNOSIS — J45901 Unspecified asthma with (acute) exacerbation: Principal | ICD-10-CM

## 2015-03-13 DIAGNOSIS — G8929 Other chronic pain: Secondary | ICD-10-CM | POA: Insufficient documentation

## 2015-03-13 DIAGNOSIS — R0789 Other chest pain: Secondary | ICD-10-CM

## 2015-03-13 DIAGNOSIS — J449 Chronic obstructive pulmonary disease, unspecified: Secondary | ICD-10-CM | POA: Insufficient documentation

## 2015-03-13 DIAGNOSIS — E785 Hyperlipidemia, unspecified: Secondary | ICD-10-CM | POA: Insufficient documentation

## 2015-03-13 DIAGNOSIS — I10 Essential (primary) hypertension: Secondary | ICD-10-CM

## 2015-03-13 DIAGNOSIS — M549 Dorsalgia, unspecified: Secondary | ICD-10-CM | POA: Insufficient documentation

## 2015-03-13 DIAGNOSIS — F329 Major depressive disorder, single episode, unspecified: Secondary | ICD-10-CM | POA: Insufficient documentation

## 2015-03-13 DIAGNOSIS — Z79899 Other long term (current) drug therapy: Secondary | ICD-10-CM | POA: Insufficient documentation

## 2015-03-13 DIAGNOSIS — F17201 Nicotine dependence, unspecified, in remission: Secondary | ICD-10-CM | POA: Diagnosis present

## 2015-03-13 DIAGNOSIS — Z87891 Personal history of nicotine dependence: Secondary | ICD-10-CM | POA: Insufficient documentation

## 2015-03-13 DIAGNOSIS — F339 Major depressive disorder, recurrent, unspecified: Secondary | ICD-10-CM | POA: Diagnosis present

## 2015-03-13 DIAGNOSIS — Z7952 Long term (current) use of systemic steroids: Secondary | ICD-10-CM | POA: Insufficient documentation

## 2015-03-13 HISTORY — DX: Unspecified osteoarthritis, unspecified site: M19.90

## 2015-03-13 LAB — CBC WITH DIFFERENTIAL/PLATELET
BASOS ABS: 0 10*3/uL (ref 0.0–0.1)
BASOS PCT: 0 %
Eosinophils Absolute: 0 10*3/uL (ref 0.0–0.7)
Eosinophils Relative: 0 %
HCT: 37.7 % (ref 36.0–46.0)
Hemoglobin: 11.9 g/dL — ABNORMAL LOW (ref 12.0–15.0)
LYMPHS PCT: 24 %
Lymphs Abs: 4.6 10*3/uL — ABNORMAL HIGH (ref 0.7–4.0)
MCH: 27.4 pg (ref 26.0–34.0)
MCHC: 31.6 g/dL (ref 30.0–36.0)
MCV: 86.7 fL (ref 78.0–100.0)
Monocytes Absolute: 2.7 10*3/uL — ABNORMAL HIGH (ref 0.1–1.0)
Monocytes Relative: 14 %
NEUTROS ABS: 12 10*3/uL — AB (ref 1.7–7.7)
NEUTROS PCT: 62 %
Platelets: 365 10*3/uL (ref 150–400)
RBC: 4.35 MIL/uL (ref 3.87–5.11)
RDW: 17.1 % — ABNORMAL HIGH (ref 11.5–15.5)
WBC: 19.3 10*3/uL — AB (ref 4.0–10.5)

## 2015-03-13 LAB — BASIC METABOLIC PANEL
ANION GAP: 12 (ref 5–15)
BUN: 7 mg/dL (ref 6–20)
CALCIUM: 8.5 mg/dL — AB (ref 8.9–10.3)
CO2: 26 mmol/L (ref 22–32)
Chloride: 100 mmol/L — ABNORMAL LOW (ref 101–111)
Creatinine, Ser: 0.82 mg/dL (ref 0.44–1.00)
GLUCOSE: 102 mg/dL — AB (ref 65–99)
POTASSIUM: 2.9 mmol/L — AB (ref 3.5–5.1)
Sodium: 138 mmol/L (ref 135–145)

## 2015-03-13 LAB — TROPONIN I: Troponin I: <0.03 ng/mL (ref <0.031)

## 2015-03-13 MED ORDER — ALBUTEROL SULFATE (2.5 MG/3ML) 0.083% IN NEBU: 2.5000 mg | INHALATION_SOLUTION | RESPIRATORY_TRACT | Status: DC | PRN

## 2015-03-13 MED ORDER — LISINOPRIL 10 MG PO TABS
10.0000 mg | ORAL_TABLET | Freq: Every day | ORAL | Status: DC
  Administered 2015-03-13 – 2015-03-14 (×2): 10 mg via ORAL
  Filled 2015-03-13 (×2): qty 1

## 2015-03-13 MED ORDER — ZOLPIDEM TARTRATE 5 MG PO TABS: 10.0000 mg | ORAL_TABLET | Freq: Every evening | ORAL | Status: DC | PRN

## 2015-03-13 MED ORDER — SODIUM CHLORIDE 0.9 % IJ SOLN: 3.0000 mL | INTRAMUSCULAR | Status: DC | PRN

## 2015-03-13 MED ORDER — LORATADINE 10 MG PO TABS
10.0000 mg | ORAL_TABLET | Freq: Every day | ORAL | Status: DC
  Administered 2015-03-13 – 2015-03-14 (×2): 10 mg via ORAL
  Filled 2015-03-13 (×2): qty 1

## 2015-03-13 MED ORDER — WHITE PETROLATUM GEL
Status: AC
  Filled 2015-03-13: qty 1

## 2015-03-13 MED ORDER — SODIUM CHLORIDE 0.9 % IJ SOLN
3.0000 mL | Freq: Two times a day (BID) | INTRAMUSCULAR | Status: DC
  Administered 2015-03-13 – 2015-03-14 (×2): 3 mL via INTRAVENOUS

## 2015-03-13 MED ORDER — IPRATROPIUM BROMIDE 0.02 % IN SOLN
0.5000 mg | Freq: Once | RESPIRATORY_TRACT | Status: AC
  Administered 2015-03-13: 0.5 mg via RESPIRATORY_TRACT

## 2015-03-13 MED ORDER — ONDANSETRON HCL 4 MG/2ML IJ SOLN: 4.0000 mg | Freq: Four times a day (QID) | INTRAMUSCULAR | Status: DC | PRN

## 2015-03-13 MED ORDER — SODIUM CHLORIDE 0.9 % IV SOLN: 250.0000 mL | INTRAVENOUS | Status: DC | PRN

## 2015-03-13 MED ORDER — ONDANSETRON HCL 4 MG PO TABS: 4.0000 mg | ORAL_TABLET | Freq: Four times a day (QID) | ORAL | Status: DC | PRN

## 2015-03-13 MED ORDER — POTASSIUM CHLORIDE CRYS ER 20 MEQ PO TBCR
40.0000 meq | EXTENDED_RELEASE_TABLET | Freq: Two times a day (BID) | ORAL | Status: AC
  Administered 2015-03-13 (×2): 40 meq via ORAL
  Filled 2015-03-13 (×2): qty 2

## 2015-03-13 MED ORDER — GUAIFENESIN ER 600 MG PO TB12
600.0000 mg | ORAL_TABLET | Freq: Two times a day (BID) | ORAL | Status: DC
  Administered 2015-03-13 – 2015-03-14 (×2): 600 mg via ORAL
  Filled 2015-03-13 (×2): qty 1

## 2015-03-13 MED ORDER — ACETAMINOPHEN 650 MG RE SUPP: 650.0000 mg | Freq: Four times a day (QID) | RECTAL | Status: DC | PRN

## 2015-03-13 MED ORDER — NICOTINE 14 MG/24HR TD PT24
14.0000 mg | MEDICATED_PATCH | Freq: Every day | TRANSDERMAL | Status: DC
  Administered 2015-03-13 – 2015-03-14 (×2): 14 mg via TRANSDERMAL
  Filled 2015-03-13 (×2): qty 1

## 2015-03-13 MED ORDER — ENOXAPARIN SODIUM 80 MG/0.8ML ~~LOC~~ SOLN
70.0000 mg | SUBCUTANEOUS | Status: DC
  Administered 2015-03-13: 70 mg via SUBCUTANEOUS
  Filled 2015-03-13 (×2): qty 0.8

## 2015-03-13 MED ORDER — PREDNISONE 50 MG PO TABS
50.0000 mg | ORAL_TABLET | Freq: Every day | ORAL | Status: DC
Start: 2015-03-14 — End: 2015-03-14
  Administered 2015-03-14: 50 mg via ORAL
  Filled 2015-03-13: qty 1

## 2015-03-13 MED ORDER — TRAMADOL HCL 50 MG PO TABS
50.0000 mg | ORAL_TABLET | Freq: Two times a day (BID) | ORAL | Status: DC | PRN
  Administered 2015-03-13 – 2015-03-14 (×2): 50 mg via ORAL
  Filled 2015-03-13 (×2): qty 1

## 2015-03-13 MED ORDER — MOMETASONE FURO-FORMOTEROL FUM 200-5 MCG/ACT IN AERO
2.0000 | INHALATION_SPRAY | Freq: Two times a day (BID) | RESPIRATORY_TRACT | Status: DC
  Administered 2015-03-13 – 2015-03-14 (×2): 2 via RESPIRATORY_TRACT
  Filled 2015-03-13: qty 8.8

## 2015-03-13 MED ORDER — LISINOPRIL-HYDROCHLOROTHIAZIDE 10-12.5 MG PO TABS
1.0000 | ORAL_TABLET | Freq: Every day | ORAL | Status: DC
  Administered 2015-03-13: 1 via ORAL

## 2015-03-13 MED ORDER — FAMOTIDINE 20 MG PO TABS
20.0000 mg | ORAL_TABLET | Freq: Every day | ORAL | Status: DC
  Administered 2015-03-13: 20 mg via ORAL
  Filled 2015-03-13: qty 1

## 2015-03-13 MED ORDER — HYDROCHLOROTHIAZIDE 12.5 MG PO CAPS
12.5000 mg | ORAL_CAPSULE | Freq: Every day | ORAL | Status: DC
  Administered 2015-03-13 – 2015-03-14 (×2): 12.5 mg via ORAL
  Filled 2015-03-13 (×2): qty 1

## 2015-03-13 MED ORDER — ALBUTEROL (5 MG/ML) CONTINUOUS INHALATION SOLN
10.0000 mg/h | INHALATION_SOLUTION | RESPIRATORY_TRACT | Status: DC
  Administered 2015-03-13: 10 mg/h via RESPIRATORY_TRACT
  Filled 2015-03-13: qty 20

## 2015-03-13 MED ORDER — IPRATROPIUM-ALBUTEROL 0.5-2.5 (3) MG/3ML IN SOLN
3.0000 mL | RESPIRATORY_TRACT | Status: DC | PRN
  Administered 2015-03-13 – 2015-03-14 (×4): 3 mL via RESPIRATORY_TRACT
  Filled 2015-03-13 (×4): qty 3

## 2015-03-13 MED ORDER — ZOLPIDEM TARTRATE 5 MG PO TABS
5.0000 mg | ORAL_TABLET | Freq: Every evening | ORAL | Status: DC | PRN
  Administered 2015-03-13: 5 mg via ORAL
  Filled 2015-03-13: qty 1

## 2015-03-13 MED ORDER — ACETAMINOPHEN 325 MG PO TABS
650.0000 mg | ORAL_TABLET | Freq: Four times a day (QID) | ORAL | Status: DC | PRN
  Administered 2015-03-13 – 2015-03-14 (×3): 650 mg via ORAL
  Filled 2015-03-13 (×3): qty 2

## 2015-03-13 MED ORDER — IPRATROPIUM BROMIDE 0.02 % IN SOLN
RESPIRATORY_TRACT | Status: AC
  Filled 2015-03-13: qty 2.5

## 2015-03-13 MED ORDER — IPRATROPIUM-ALBUTEROL 0.5-2.5 (3) MG/3ML IN SOLN
3.0000 mL | RESPIRATORY_TRACT | Status: DC
  Administered 2015-03-13 – 2015-03-14 (×7): 3 mL via RESPIRATORY_TRACT
  Filled 2015-03-13 (×7): qty 3

## 2015-03-13 MED ORDER — MAGNESIUM SULFATE 2 GM/50ML IV SOLN
2.0000 g | Freq: Once | INTRAVENOUS | Status: AC
  Administered 2015-03-13: 2 g via INTRAVENOUS
  Filled 2015-03-13: qty 50

## 2015-03-13 NOTE — ED Notes (Signed)
Pt remains monitored by blood pressure, pulse ox, and 5 lead.  

## 2015-03-13 NOTE — Progress Notes (Signed)
CAT started. Pt is stable at this time. No complications noted.

## 2015-03-13 NOTE — ED Notes (Signed)
Attempted to call report to floor.  Was informed that RN taking assignment would not arrive until 11:00.

## 2015-03-13 NOTE — Progress Notes (Signed)
Pharmacy: Lovenox Dose Adjustment  OBJECTIVE:  Wt: 142.9 kg Ht: 66 inches   ASSESSMENT:  10 YOF with BMI~51 and CrCl>30 ml/min requiring a lovenox dose adjustment for VTE prophylaxis.   PLAN:  1. Adjust Lovenox to 70 mg SQ every 24 hours 2. Will monitor peripherally for any necessary dose adjustments  Alycia Rossetti, PharmD, BCPS Clinical Pharmacist Pager: [phone] 03/13/2015 11:55 AM

## 2015-03-13 NOTE — ED Provider Notes (Signed)
CSN: [id number]     Arrival date & time 03/13/15  0500 History   First MD Initiated Contact with Patient 03/13/15 [phone]     Chief Complaint  Patient presents with  . Shortness of Breath     (Consider location/radiation/quality/duration/timing/severity/associated sxs/prior Treatment) HPI Patient is seen frequently in the emergency department for asthma exacerbation. States she woke with wheezing and shortness of breath. EMS has given patient IV Solu-Medrol and 125 mg albuterol nebs in route. There is in some improvement of her symptoms. Patient admits to a dry cough. No fever or chills. No lower extremity swelling or pain. Patient states she is followed by Dr. Annamaria Boots. Past Medical History  Diagnosis Date  . Acanthosis nigricans   . Menorrhagia   . Hypertension, essential   . Insomnia   . Morbid obesity ([place])   . Helicobacter pylori (H. pylori) infection   . Sleep apnea     Sleep study 2008 - mild OSA, not enough events to titrate CPAP  . COPD (chronic obstructive pulmonary disease) ([place])     PFTs in 2002, FEV1/FVC 65, no post bronchodilater test done  . Asthma     Followed by Dr. Annamaria Boots (pulmonology); receives every other week omalizumab injections; has frequent exacerbations  . Depression   . GERD (gastroesophageal reflux disease)   . Tobacco user   . Obesity   . Shortness of breath   . Headache(784.0)   . Seasonal allergies    Past Surgical History  Procedure Laterality Date  . Tubal ligation  1996    bilateral  . Breast reduction surgery  09/2011   Family History  Problem Relation Age of Onset  . Asthma Daughter   . Cancer Paternal Aunt   . Asthma Maternal Grandmother   . Hypertension Mother    Social History  Substance Use Topics  . Smoking status: Former Smoker -- 18 years    Types: Cigarettes    Quit date: 09/15/2014  . Smokeless tobacco: Former Systems developer    Quit date: 09/12/2014  . Alcohol Use: No   OB History    No data available     Review of Systems   Constitutional: Negative for fever and chills.  HENT: Negative for congestion, rhinorrhea and sore throat.   Respiratory: Positive for cough, shortness of breath and wheezing.   Cardiovascular: Negative for chest pain, palpitations and leg swelling.  Gastrointestinal: Negative for nausea, vomiting, abdominal pain and diarrhea.  Musculoskeletal: Negative for myalgias, back pain, neck pain and neck stiffness.  Skin: Negative for rash and wound.  Neurological: Negative for dizziness, weakness, light-headedness, numbness and headaches.  All other systems reviewed and are negative.     Allergies  Review of patient's allergies indicates no known allergies.  Home Medications   Prior to Admission medications   Medication Sig Start Date End Date Taking? Authorizing Provider  albuterol (PROVENTIL HFA;VENTOLIN HFA) 108 (90 BASE) MCG/ACT inhaler Inhale 1-2 puffs into the lungs every 4 (four) hours as needed for wheezing or shortness of breath. Patient needs appointment before new Rx can be given 01/13/15  Yes Arnoldo Morale, MD  albuterol (PROVENTIL) (2.5 MG/3ML) 0.083% nebulizer solution Take 3 mLs (2.5 mg total) by nebulization every 6 (six) hours as needed for wheezing or shortness of breath. 11/18/14  Yes Arnoldo Morale, MD  fexofenadine (ALLEGRA) 60 MG tablet Take 1 tablet (60 mg total) by mouth 2 (two) times daily. 02/08/15  Yes Tatyana Kirichenko, PA-C  ipratropium (ATROVENT) 0.02 % nebulizer solution Take 2.5 mLs (  0.5 mg total) by nebulization 4 (four) times daily as needed for wheezing or shortness of breath. 11/18/14  Yes Arnoldo Morale, MD  lisinopril-hydrochlorothiazide (PRINZIDE,ZESTORETIC) 10-12.5 MG per tablet Take 1 tablet by mouth daily. 11/18/14  Yes Arnoldo Morale, MD  mometasone-formoterol (DULERA) 200-5 MCG/ACT AERO Inhale 2 puffs into the lungs 2 (two) times daily. Rinse mouth after inhalation 09/09/14  Yes [name], MD  omalizumab Arvid Right) 150 MG injection Inject 150 mg into the skin  every 14 (fourteen) days.   Yes Historical Provider, MD  ranitidine (ZANTAC) 150 MG tablet Take 2 tablets (300 mg total) by mouth at bedtime. Patient taking differently: Take 300 mg by mouth every morning.  11/03/14  Yes [name], MD  sertraline (ZOLOFT) 50 MG tablet Take 1 tablet (50 mg total) by mouth daily. 11/18/14  Yes Arnoldo Morale, MD  zolpidem (AMBIEN) 10 MG tablet Take 10 mg by mouth at bedtime as needed for sleep.   Yes Historical Provider, MD  azithromycin (ZITHROMAX Z-PAK) 250 MG tablet Take 1 tablet (250 mg total) by mouth daily. 500mg  PO day 1, then 250mg  PO days 205 Patient not taking: Reported on 03/13/2015 03/12/15   Waynetta Pean, PA-C  naproxen (NAPROSYN) 250 MG tablet Take 1 tablet (250 mg total) by mouth 2 (two) times daily with a meal. Patient not taking: Reported on 03/13/2015 03/12/15   Waynetta Pean, PA-C  predniSONE (DELTASONE) 20 MG tablet Take 2 tablets (40 mg total) by mouth daily. Patient not taking: Reported on 03/13/2015 03/12/15   Waynetta Pean, PA-C   BP 174/82 mmHg  Pulse 104  Temp(Src) 98.2 F (36.8 C) (Oral)  Resp 24  SpO2 99%  LMP 03/01/2015 Physical Exam  Constitutional: She is oriented to person, place, and time. She appears well-developed and well-nourished. No distress.  HENT:  Head: Normocephalic and atraumatic.  Mouth/Throat: Oropharynx is clear and moist. No oropharyngeal exudate.  Eyes: EOM are normal. Pupils are equal, round, and reactive to light.  Neck: Normal range of motion. Neck supple.  Cardiovascular: Normal rate and regular rhythm.   Pulmonary/Chest: No stridor. No respiratory distress. She has wheezes (diffuse expiratory wheezing). She has no rales. She exhibits no tenderness.  Mildly increased work of breathing  Abdominal: Soft. Bowel sounds are normal. She exhibits no distension and no mass. There is no tenderness. There is no rebound and no guarding.  Musculoskeletal: Normal range of motion. She exhibits no edema or  tenderness.  No lower extremity swelling or pain.  Neurological: She is alert and oriented to person, place, and time.  Skin: Skin is warm and dry. No rash noted. No erythema.  Psychiatric: She has a normal mood and affect. Her behavior is normal.  Nursing note and vitals reviewed.   ED Course  Procedures (including critical care time) Labs Review Labs Reviewed  CBC WITH DIFFERENTIAL/PLATELET - Abnormal; Notable for the following:    WBC 19.3 (*)    Hemoglobin 11.9 (*)    RDW 17.1 (*)    Neutro Abs 12.0 (*)    Lymphs Abs 4.6 (*)    Monocytes Absolute 2.7 (*)    All other components within normal limits  BASIC METABOLIC PANEL - Abnormal; Notable for the following:    Potassium 2.9 (*)    Chloride 100 (*)    Glucose, Bld 102 (*)    Calcium 8.5 (*)    All other components within normal limits    Imaging Review Dg Chest Port 1 View  03/13/2015  CLINICAL  DATA:  Shortness of breath, history of COPD, morbid obesity. EXAM: PORTABLE CHEST 1 VIEW COMPARISON:  Chest radiograph March 10, 2015 FINDINGS: Cardiomediastinal silhouette is normal. The lungs are clear without pleural effusions or focal consolidations. Trachea projects midline and there is no pneumothorax. Soft tissue planes and included osseous structures are non-suspicious. Spurring along the superior margin of the acromion, or artifact related to overlying marker. Large body habitus. IMPRESSION: No acute cardiopulmonary process. Electronically Signed   By: Elon Alas M.D.   On: 03/13/2015 05:44   I have personally reviewed and evaluated these images and lab results as part of my medical decision-making.   EKG Interpretation None      MDM   Final diagnoses:  Asthma exacerbation    Patient with persistent wheezing despite Solu-Medrol, magnesium and continuous breathing treatment. Maintaining oxygen saturations. Discussed with family medicine resident Dr.Wight. Will admit to MedSurg observation bed.    Julianne Rice, MD 03/13/15 [phone]

## 2015-03-13 NOTE — H&P (Signed)
April [name] Admission History and Physical Service Pager: [phone]  Patient name: April [name] Medical record number: [id number] Date of birth: [birth year] Age: 44 y.o. Gender: female  Primary Care Provider: [name], MD Consultants: none Code Status: Full  Chief Complaint: shortness of breath  Assessment and Plan: April [name] is a 44 y.o. female presenting with shortness of breath and wheezing. PMH is significant for asthma, hypertension, tobacco abuse, obesity, back pain, depression  Asthma exacerbation: frequent ED visits for the same, in past month has had 5 ED visits. Lab with mild hypokalemia, elevated WBC 19 (though on steroids), CXR without acute process, EKG NSR with some blunted T waves. Received mag 2g, atrovent and CAT for 2 hours in ED with improvement. Continues to have coarse breath sounds with wheezing but overall reports significant improvement. No evidence for infection so will not continue azithromycin she was prescribed yesterday (but hadn't picked it up). She is a patient of Dr. Annamaria [name], last visit in June 2016. DDx also includes pulmonary embolism; low suspicion for this at this time given normal calf size and oxygen saturation off oxygen. - observation - duonebs q4hrs scheduled, q2hrs prn - continue home dulera - continue antihistamine - supplemental o2 as needed - continue prednisone 50mg  tomorrow - she is on omalizumab/xolair injection q2 weeks  Chest pain, atypical: likely MSK, started and worse with coughing; no exertional component. EKG appears unchanged from prior.  - check troponin  Leukocytosis: in setting of steroid use, afebrile and no other evidence of infection - repeat CBC in AM  Hypokalemia: K+ 2.9, suspected in setting of frequent albuterol use. No evidence of kidney injury - replete kdur 58meq x 2  Hypertension: elevated in ED - continue home lisinopril and hctz  Back pain: chronic issue, reports opioid use in  the past but not ongoing - tylenol PRN - tramadol PRN for breakthrough  GERD: stable - continue home H2 blocker  Depression: stable - continue home zoloft  Tobacco use: reports 4 cigarettes per week (smokes for stress relief) but states needs patch on admission - nicotine patch - smoking cessation counseling given  FEN/GI: heart healthy / saline lock Prophylaxis: lovenox  Disposition: observation  History of Present Illness:  April [name] is a 44 y.o. female presenting with shortness of breath. She has had 5 visits in past month for this issue, was in ED yesterday and 3 days ago. She has a history of asthma, no history of intubation; has had several hospitalizations for asthma exacerbations in the past year. She says the shortness of breath has been ongoing for the past month and nothing has made it go away completely. She has been unable to get any sleep from the shortness of breath and coughing so decided to come back to the ED early this morning. She additionally reports some chest pain that started with the dry cough; chest pain is central and does have a burning type quality that is worse with cough, does not get worse with exertion/movement or position. Chest pain started in the past day, does not radiate. She also reports feeling like her right leg is a little swollen. She has been taking her inhalers/nebulizers as prescribed; she did not pick up or start the azithromycin she was prescribed yesterday. She reports her breathing has improved since arriving to the ED.  Review Of Systems: Per HPI with the following additions: several vomiting episodes with coughing episode, no HA, no diarrhea, no dysuria Otherwise the remainder  of the systems were negative.  Patient Active Problem List   Diagnosis Date Noted  . Back pain 11/18/2014  . Asthma 11/03/2014  . Tobacco abuse   . Morbid obesity ([place])   . Screening for STD (sexually transmitted disease) 02/01/2014  . Right foot pain  02/01/2014  . Left ankle pain 12/08/2013  . Other and unspecified hyperlipidemia 12/08/2013  . Asthma exacerbation 11/23/2013  . Swelling of right knee joint 09/17/2013  . Shoulder pain, left 09/17/2013  . Heat rash 09/17/2013  . Seasonal allergic rhinitis 08/29/2013  . Health care maintenance 06/09/2013  . Back pain of thoracolumbar region 05/07/2013  . Pain in joint involving right ankle and foot 02/22/2013  . Leukocytosis 10/21/2012  . Protein malnutrition ([place]) 10/21/2012  . T wave inversion in EKG 10/06/2012  . Acute asthma exacerbation 05/22/2012  . Asthma, chronic obstructive, without status asthmaticus ([place]) 05/07/2012  . Knee pain, bilateral 04/25/2011  . Insomnia 03/14/2011  . Constipation 03/14/2011  . Obstructive sleep apnea 12/19/2010  . Normocytic anemia 03/08/2010  . Cervical back pain with evidence of disc disease 04/08/2008  . LEG EDEMA, LEFT 01/29/2008  . HEADACHE 06/17/2007  . ACANTHOSIS NIGRICANS 05/06/2007  . MENORRHAGIA 09/02/2006  . Essential hypertension 07/31/2006  . Morbid obesity with body mass index of 50.0-59.9 in adult ([place]) 06/17/2006  . History of tobacco abuse 04/10/2006  . Major depressive disorder, recurrent episode ([place]) 04/10/2006  . Esophageal reflux 04/10/2006    Past Medical History: Past Medical History  Diagnosis Date  . Acanthosis nigricans   . Menorrhagia   . Hypertension, essential   . Insomnia   . Morbid obesity ([place])   . Helicobacter pylori (H. pylori) infection   . Sleep apnea     Sleep study 2008 - mild OSA, not enough events to titrate CPAP  . COPD (chronic obstructive pulmonary disease) ([place])     PFTs in 2002, FEV1/FVC 65, no post bronchodilater test done  . Asthma     Followed by Dr. Annamaria [name] (pulmonology); receives every other week omalizumab injections; has frequent exacerbations  . Depression   . GERD (gastroesophageal reflux disease)   . Tobacco user   . Obesity   . Shortness of breath   . Headache(784.0)   .  Seasonal allergies     Past Surgical History: Past Surgical History  Procedure Laterality Date  . Tubal ligation  1996    bilateral  . Breast reduction surgery  09/2011    Social History: Social History  Substance Use Topics  . Smoking status: Former Smoker -- 18 years    Types: Cigarettes    Quit date: 09/15/2014  . Smokeless tobacco: Former Systems developer    Quit date: 09/12/2014  . Alcohol Use: No   Additional social history: lives with daughter  Please also refer to relevant sections of EMR.  Family History: Family History  Problem Relation Age of Onset  . Asthma Daughter   . Cancer Paternal Aunt   . Asthma Maternal Grandmother   . Hypertension Mother    Allergies and Medications: No Known Allergies No current facility-administered medications on file prior to encounter.   Current Outpatient Prescriptions on File Prior to Encounter  Medication Sig Dispense Refill  . albuterol (PROVENTIL HFA;VENTOLIN HFA) 108 (90 BASE) MCG/ACT inhaler Inhale 1-2 puffs into the lungs every 4 (four) hours as needed for wheezing or shortness of breath. Patient needs appointment before new Rx can be given 18 g 0  . albuterol (PROVENTIL) (2.5 MG/3ML) 0.083% nebulizer  solution Take 3 mLs (2.5 mg total) by nebulization every 6 (six) hours as needed for wheezing or shortness of breath. 150 mL 1  . fexofenadine (ALLEGRA) 60 MG tablet Take 1 tablet (60 mg total) by mouth 2 (two) times daily. 30 tablet 0  . ipratropium (ATROVENT) 0.02 % nebulizer solution Take 2.5 mLs (0.5 mg total) by nebulization 4 (four) times daily as needed for wheezing or shortness of breath. 75 mL 2  . lisinopril-hydrochlorothiazide (PRINZIDE,ZESTORETIC) 10-12.5 MG per tablet Take 1 tablet by mouth daily. 30 tablet 2  . mometasone-formoterol (DULERA) 200-5 MCG/ACT AERO Inhale 2 puffs into the lungs 2 (two) times daily. Rinse mouth after inhalation 1 Inhaler prn  . omalizumab (XOLAIR) 150 MG injection Inject 150 mg into the skin every  14 (fourteen) days.    . ranitidine (ZANTAC) 150 MG tablet Take 2 tablets (300 mg total) by mouth at bedtime. (Patient taking differently: Take 300 mg by mouth every morning. ) 180 tablet 3  . sertraline (ZOLOFT) 50 MG tablet Take 1 tablet (50 mg total) by mouth daily. 30 tablet 1  . zolpidem (AMBIEN) 10 MG tablet Take 10 mg by mouth at bedtime as needed for sleep.    [name] azithromycin (ZITHROMAX Z-PAK) 250 MG tablet Take 1 tablet (250 mg total) by mouth daily. 500mg  PO day 1, then 250mg  PO days 205 (Patient not taking: Reported on 03/13/2015) 6 tablet 0  . naproxen (NAPROSYN) 250 MG tablet Take 1 tablet (250 mg total) by mouth 2 (two) times daily with a meal. (Patient not taking: Reported on 03/13/2015) 30 tablet 0  . predniSONE (DELTASONE) 20 MG tablet Take 2 tablets (40 mg total) by mouth daily. (Patient not taking: Reported on 03/13/2015) 10 tablet 0    Objective: BP 174/82 mmHg  Pulse 104  Temp(Src) 98.2 F (36.8 C) (Oral)  Resp 24  SpO2 99%  LMP 03/01/2015 Exam: General: sitting up in bed in NAD Eyes: PERRL, EOMI ENTM: no oropharyngeal lesions. No nasal discharge Neck: thick, supple Cardiovascular: tachycardic, regular rhythm, normal heart sounds, no murmurs, rubs or gallop. 2+ radial, PT pulses bilaterally. Chest: tenderness to palpation of sternum/right sternal border. Respiratory: coarse breath sounds with expiratory wheezes and prolonged expiratory phase throughout. Rate is slightly elevated, 20-24. No accessory muscle use Abdomen: obese, soft, nontender, nondistended, normal bowel sounds MSK: normal bulk/tone, no deformities. Calf size is roughly equivalent; there is mild tenderness of the right calf. Skin: no acanthosis noted, no rashes Neuro: alert and oriented, CN grossly normal. No deficits noted. Psych: normal mood/affect, normal thought content/speech  Labs and Imaging: CBC BMET   Recent Labs Lab 03/13/15 0529  WBC 19.3*  HGB 11.9*  HCT 37.7  PLT 365    Recent  Labs Lab 03/13/15 0529  NA 138  K 2.9*  CL 100*  CO2 26  BUN 7  CREATININE 0.82  GLUCOSE 102*  CALCIUM 8.5*     Dg Chest 2 View  03/10/2015  CLINICAL DATA:  Cough for 1 week, worsened today. EXAM: CHEST  2 VIEW COMPARISON:  PA and lateral chest 02/08/2015 and 03/27/2014. FINDINGS: The lungs are clear. Heart size is normal. No pneumothorax or pleural effusion. No focal bony abnormality. IMPRESSION: Negative chest. Electronically Signed   By: Inge Rise M.D.   On: 03/10/2015 09:14   Dg Chest Port 1 View  03/13/2015  CLINICAL DATA:  Shortness of breath, history of COPD, morbid obesity. EXAM: PORTABLE CHEST 1 VIEW COMPARISON:  Chest radiograph March 10, 2015  FINDINGS: Cardiomediastinal silhouette is normal. The lungs are clear without pleural effusions or focal consolidations. Trachea projects midline and there is no pneumothorax. Soft tissue planes and included osseous structures are non-suspicious. Spurring along the superior margin of the acromion, or artifact related to overlying marker. Large body habitus. IMPRESSION: No acute cardiopulmonary process. Electronically Signed   By: Elon Alas M.D.   On: 03/13/2015 05:44     [name], MD 03/13/2015, 8:48 AM PGY-3, [place] Intern pager: [phone], text pages welcome

## 2015-03-13 NOTE — ED Notes (Addendum)
SOB started last night at 0230 am. Pt has been feeling bad for the past 2 weeks. Pt. Was seen here yesterday for the same.  Received 2 albuterol breathing treatments and 125 of solumedrol

## 2015-03-13 NOTE — ED Notes (Signed)
Pt. States she received prescriptions yesterday but did not get them filled because she didn't feel sick.

## 2015-03-14 LAB — BASIC METABOLIC PANEL
Anion gap: 8 (ref 5–15)
BUN: 9 mg/dL (ref 6–20)
CHLORIDE: 103 mmol/L (ref 101–111)
CO2: 26 mmol/L (ref 22–32)
CREATININE: 0.75 mg/dL (ref 0.44–1.00)
Calcium: 8.2 mg/dL — ABNORMAL LOW (ref 8.9–10.3)
GFR calc non Af Amer: >60 mL/min (ref >60)
Glucose, Bld: 90 mg/dL (ref 65–99)
POTASSIUM: 3.3 mmol/L — AB (ref 3.5–5.1)
SODIUM: 137 mmol/L (ref 135–145)

## 2015-03-14 LAB — CBC
HEMATOCRIT: 35.2 % — AB (ref 36.0–46.0)
HEMOGLOBIN: 11.1 g/dL — AB (ref 12.0–15.0)
MCH: 27.3 pg (ref 26.0–34.0)
MCHC: 31.5 g/dL (ref 30.0–36.0)
MCV: 86.5 fL (ref 78.0–100.0)
Platelets: 318 10*3/uL (ref 150–400)
RBC: 4.07 MIL/uL (ref 3.87–5.11)
RDW: 16.9 % — ABNORMAL HIGH (ref 11.5–15.5)
WBC: 16.7 10*3/uL — ABNORMAL HIGH (ref 4.0–10.5)

## 2015-03-14 MED ORDER — NICOTINE 14 MG/24HR TD PT24: 14.0000 mg | MEDICATED_PATCH | Freq: Every day | TRANSDERMAL | Status: DC

## 2015-03-14 MED ORDER — GUAIFENESIN ER 600 MG PO TB12: 600.0000 mg | ORAL_TABLET | Freq: Two times a day (BID) | ORAL | Status: DC | PRN

## 2015-03-14 MED ORDER — PREDNISONE 50 MG PO TABS: 50.0000 mg | ORAL_TABLET | Freq: Every day | ORAL | Status: DC

## 2015-03-14 MED ORDER — POTASSIUM CHLORIDE CRYS ER 20 MEQ PO TBCR
40.0000 meq | EXTENDED_RELEASE_TABLET | Freq: Once | ORAL | Status: AC
  Administered 2015-03-14: 40 meq via ORAL
  Filled 2015-03-14: qty 2

## 2015-03-14 NOTE — Discharge Summary (Signed)
[facility] Discharge Summary  Patient name: April [name] Medical record number: [id number] Date of birth: [birth year] Age: 44 y.o. Gender: female Date of Admission: 03/13/2015  Date of Discharge: 03/14/15 Admitting Physician: Lind Covert, MD  Primary Care Provider: [name], MD Consultants: none  Indication for Hospitalization: asthma exacerbation  Discharge Diagnoses/Problem List:  Asthma Exacerbation Atypical Chest pain  Leukocytosis Hypokalemia HTN Chronic Back Pain GERD Depression Tobacco Use  Disposition: home  Discharge Condition: improved  Discharge Exam: Obtained from progress note from day of discharge   Brief Hospital Course:   Asthma Exacerbation: Patient presented with shortness of breath. She has had 5 visits in past month for this issue. Shortness of breath had been ongoing for the past month and nothing had made it go away completely. She had been unable to get any sleep from the shortness of breath and coughing so decided to come back to the ED.  She had been taking her inhalers/nebulizers as prescribed; she did not pick up or start the azithromycin or the Prednisone she was prescribed by the ED the day prior to admission. She did note she had been taking Prednisone 10mg  prescribed by her pulmonologist however only when she started getting symptoms (rather than daily as prescribed). Of note, she is a patient of Dr. Annamaria Boots, last visit in June 2016.  On admission, labs did show leukocytosis although it was thought to be due to patient being on steroids. CXR showed no acute process. Patient received mag 2g, atrovent, and CAT for 2 hours in the ED with improvement. There was no evidence for infection, therefore Azithromycin prescribed as an outpatient was not started. Patienet was given scheduled nebulizers overnight and started on Prednisone 50mg . Symptoms improved overnight and patient only had mild expiratory wheezes. Patient  was instructed to continue scheduled nebulizer treatment for 24 hours after discharge. Additionally patient was prescribed Prednisone 50mg  for total 5 day burst. Patient notes she had an appointment with her pulmonologist on 03/20/15.   Atypical Chest Pain: She additionally reported some chest pain that started with the dry cough; chest pain is central and does have a burning type quality that is worse with cough, does not get worse with exertion/movement or position. Symptoms were thought to be musculoskeletal. EKG showed NSR with some blunted T waves. However troponin I was negative x1.   Hypokalemia: On admission, K was 2.9, which was suspected to be due to frequent albuterol [place]. There was no evidence of kidney injury. Potassium was repleted.  Patient's other chronic medical diagnoses where monitored and managed with home medications.    Issues for Follow Up:  - follow up respiratory status and whether or not patient will need a extended course of steroids - consider repeat BMP to evaluate electrolytes - ensure patient has followed up with her Pulmonologist   Significant Procedures: none  Significant Labs and Imaging:   Recent Labs Lab 03/10/15 0835 03/13/15 0529 03/14/15 0322  WBC 17.0* 19.3* 16.7*  HGB 11.9* 11.9* 11.1*  HCT 36.6 37.7 35.2*  PLT 416* 365 318    Recent Labs Lab 03/10/15 0835 03/13/15 0529 03/14/15 0322  NA 139 138 137  K 3.3* 2.9* 3.3*  CL 101 100* 103  CO2 27 26 26   GLUCOSE 104* 102* 90  BUN 12 7 9   CREATININE 0.67 0.82 0.75  CALCIUM 8.4* 8.5* 8.2*   Results/Tests Pending at Time of Discharge: none  Discharge Medications:    Medication List  STOP taking these medications        azithromycin 250 MG tablet  Commonly known as:  ZITHROMAX Z-PAK     naproxen 250 MG tablet  Commonly known as:  NAPROSYN      TAKE these medications        albuterol (2.5 MG/3ML) 0.083% nebulizer solution  Commonly known as:  PROVENTIL  Take 3 mLs (2.5 mg  total) by nebulization every 6 (six) hours as needed for wheezing or shortness of breath.     albuterol 108 (90 BASE) MCG/ACT inhaler  Commonly known as:  PROVENTIL HFA;VENTOLIN HFA  Inhale 1-2 puffs into the lungs every 4 (four) hours as needed for wheezing or shortness of breath. Patient needs appointment before new Rx can be given     fexofenadine 60 MG tablet  Commonly known as:  ALLEGRA  Take 1 tablet (60 mg total) by mouth 2 (two) times daily.     guaiFENesin 600 MG 12 hr tablet  Commonly known as:  MUCINEX  Take 1 tablet (600 mg total) by mouth 2 (two) times daily as needed.     ipratropium 0.02 % nebulizer solution  Commonly known as:  ATROVENT  Take 2.5 mLs (0.5 mg total) by nebulization 4 (four) times daily as needed for wheezing or shortness of breath.     lisinopril-hydrochlorothiazide 10-12.5 MG tablet  Commonly known as:  PRINZIDE,ZESTORETIC  Take 1 tablet by mouth daily.     mometasone-formoterol 200-5 MCG/ACT Aero  Commonly known as:  DULERA  Inhale 2 puffs into the lungs 2 (two) times daily. Rinse mouth after inhalation     nicotine 14 mg/24hr patch  Commonly known as:  NICODERM CQ - dosed in mg/24 hours  Place 1 patch (14 mg total) onto the skin daily.     omalizumab 150 MG injection  Commonly known as:  XOLAIR  Inject 150 mg into the skin every 14 (fourteen) days.     predniSONE 50 MG tablet  Commonly known as:  DELTASONE  Take 1 tablet (50 mg total) by mouth daily with breakfast.     ranitidine 150 MG tablet  Commonly known as:  ZANTAC  Take 2 tablets (300 mg total) by mouth at bedtime.     sertraline 50 MG tablet  Commonly known as:  ZOLOFT  Take 1 tablet (50 mg total) by mouth daily.     zolpidem 10 MG tablet  Commonly known as:  AMBIEN  Take 10 mg by mouth at bedtime as needed for sleep.        Discharge Instructions: Please refer to Patient Instructions section of EMR for full details.  Patient was counseled important signs and symptoms  that should prompt return to medical care, changes in medications, dietary instructions, activity restrictions, and follow up appointments.   Follow-Up Appointments: Follow-up Information    Follow up with [name], MD.   Specialty:  Internal Medicine   Why:  Please make a hospital follow up appointment with Dr. Genene Churn in about 1 week    Contact information:   [place] [address] [phone]       Smiley Houseman, MD 03/15/2015, 6:50 PM PGY-1, [place]

## 2015-03-14 NOTE — Telephone Encounter (Signed)
April [name] from [facility] Case number [id number] Needs to talk to nurse about xolair shipment, they are unable to ship due to medical necessity.

## 2015-03-14 NOTE — Telephone Encounter (Signed)
[name] please advise when this has been handled.  Thanks.

## 2015-03-14 NOTE — Telephone Encounter (Signed)
I sent infusion form. Joellen Jersey does the SMN and the financial side of it. I just order and do verbal rxs and p/as over the ph.[name]

## 2015-03-14 NOTE — Telephone Encounter (Signed)
Spoke with April [name] at [facility], states that there are 300mg  unaccounted for from 11/21/14 shipment.  Last form sent back was on 03/07/15 DOS.  Pt also needs a new SMN-one was faxed but was not signed by a doctor, and also a Event organiser form to be filled out.  This is being faxed to our office, verified fax # to our office.  Forwarding to Joellen Jersey and Tammy to make aware and look out for.

## 2015-03-14 NOTE — Progress Notes (Signed)
Family Medicine Teaching Service Daily Progress Note Intern Pager: [phone]  Patient name: April [name] Medical record number: [id number] Date of birth: 06-Jan-1971 Age: 44 y.o. Gender: female  Primary Care Provider: [name], MD Consultants: none Code Status: FULL  Pt Overview and Major Events to Date:  11/14: admitted for asthma exacerbation   Assessment and Plan: April [name] is a 44 y.o. female presenting with shortness of breath and wheezing. PMH is significant for asthma, hypertension, tobacco abuse, obesity, back pain, depression  Asthma exacerbation: frequent ED visits for the same, in past month has had 5 ED visits. Lab with mild hypokalemia, elevated WBC 19 (though on steroids), CXR without acute process, EKG NSR with some blunted T waves. Received mag 2g, atrovent and CAT for 2 hours in ED with improvement. No evidence for infection so will not continue azithromycin she was prescribed as an outpatient (but hadn't picked it up). She is a patient of Dr. Annamaria Boots, last visit in June 2016. Breathing improved overnight; mild expiratory wheezes, on room air with good saturations. Patient ready to go home today.  - duonebs q4hrs scheduled, q2hrs prn - continue home dulera - continue antihistamine - continue prednisone 50mg   - she is on omalizumab/xolair injection q2 weeks - home today   Chest pain, atypical: likely MSK, started and worse with coughing; no exertional component. EKG appears unchanged from prior. Troponin neg x1   Leukocytosis: in setting of steroid use, afebrile and no other evidence of infection - 19.3 > 16.7  Hypokalemia: K+ 2.9 > 3.3, suspected in setting of frequent albuterol use. No evidence of kidney injury - kdur 33meq x 2 11/14 - Kdur 40 meq x 1 today   Hypertension: elevated in ED;  (148-167/73-90) - continue home lisinopril and hctz  Back pain: chronic issue, reports opioid use in the past but not ongoing - tylenol PRN - tramadol PRN for  breakthrough  GERD: stable - continue home H2 blocker  Depression: stable - continue home zoloft  Tobacco use: reports 4 cigarettes per week (smokes for stress relief) but states needs patch on admission - nicotine patch - smoking cessation counseling given  FEN/GI: heart healthy / saline lock Prophylaxis: lovenox  Disposition: observation  Subjective:  - feeling better with breathing - mucinex is helping with her congestion. Notes of productive cough last night with some yellow sputum - no fevers or chills  - notes of sick contact with cold  Objective: Temp:  [97.8 F (36.6 C)-99.1 F (37.3 C)] 99.1 F (37.3 C) (11/15 0500) Pulse Rate:  [94-111] 109 (11/15 0500) Resp:  [14-26] 19 (11/15 0500) BP: (148-190)/(73-98) 148/73 mmHg (11/15 0500) SpO2:  [95 %-100 %] 100 % (11/15 0500) Physical Exam: GEN: NAD  CV: RRR, no murmurs, rubs, or gallops PULM: normal effort, on room air, mild expiratory wheezing heard bilaterally, on room air with good saturations ABD: Soft, nontender, nondistended, NABS, no organomegaly SKIN: No rash or cyanosis; warm and well-perfused EXTR: No lower extremity edema or calf tenderness PSYCH: Mood and affect euthymic, normal rate and volume of speech NEURO: Awake, alert, no focal deficits grossly, normal speech  Laboratory:  Recent Labs Lab 03/10/15 0835 03/13/15 0529 03/14/15 0322  WBC 17.0* 19.3* 16.7*  HGB 11.9* 11.9* 11.1*  HCT 36.6 37.7 35.2*  PLT 416* 365 318    Recent Labs Lab 03/10/15 0835 03/13/15 0529 03/14/15 0322  NA 139 138 137  K 3.3* 2.9* 3.3*  CL 101 100* 103  CO2 27 26 26  BUN 12 7 9   CREATININE 0.67 0.82 0.75  CALCIUM 8.4* 8.5* 8.2*  GLUCOSE 104* 102* 90    Smiley Houseman, MD 03/14/2015, 7:06 AM PGY-1, [place] Intern pager: [phone], text pages welcome

## 2015-03-14 NOTE — Progress Notes (Signed)
April [name] to be D/C'd home per MD order. Discussed with the patient and all questions fully answered.  VSS, Skin clean, dry and intact without evidence of skin break down, no evidence of skin tears noted.  IV catheter discontinued intact. Site without signs and symptoms of complications. Dressing and pressure applied.  An After Visit Summary was printed and given to the patient. Prescriptions called in to patient's pharmacy.  Patient received work note.  D/c education completed with patient/family including follow up instructions, medication list, d/c activities limitations if indicated, with other d/c instructions as indicated by MD - patient able to verbalize understanding, all questions fully answered.   Patient instructed to return to ED, call 911, or call MD for any changes in condition.   Patient to be escorted via [place], and D/C home via private auto.

## 2015-03-14 NOTE — Discharge Instructions (Signed)
You were hospitalized for an asthma flare. Please finish the steroid course that we have prescribed you with. Please continue to use your inhaler or nebulizer every 4 hours for the next 24 hours. Please take your other medications as prescribed.   Please keep you appointment with your pulmonologist on Monday 11/21.  Please make an appointment with your primary care doctor in about 1 week.  Asthma Attack Prevention While you may not be able to control the fact that you have asthma, you can take actions to prevent asthma attacks. The best way to prevent asthma attacks is to maintain good control of your asthma. You can achieve this by:  Taking your medicines as directed.  Avoiding things that can irritate your airways or make your asthma symptoms worse (asthma triggers).  Keeping track of how well your asthma is controlled and of any changes in your symptoms.  Responding quickly to worsening asthma symptoms (asthma attack).  Seeking emergency care when it is needed. WHAT ARE SOME WAYS TO PREVENT AN ASTHMA ATTACK? Have a Plan Work with your health care provider to create a written plan for managing and treating your asthma attacks (asthma action plan). This plan includes:  A list of your asthma triggers and how you can avoid them.  Information on when medicines should be taken and when their dosages should be changed.  The use of a device that measures how well your lungs are working (peak flow meter). Monitor Your Asthma Use your peak flow meter and record your results in a journal every day. A drop in your peak flow numbers on one or more days may indicate the start of an asthma attack. This can happen even before you start to feel symptoms. You can prevent an asthma attack from getting worse by following the steps in your asthma action plan. Avoid Asthma Triggers Work with your asthma health care provider to find out what your asthma triggers are. This can be done by:  Allergy  testing.  Keeping a journal that notes when asthma attacks occur and the factors that may have contributed to them.  Determining if there are other medical conditions that are making your asthma worse. Once you have determined your asthma triggers, take steps to avoid them. This may include avoiding excessive or prolonged exposure to:  Dust. Have someone dust and vacuum your home for you once or twice a week. Using a high-efficiency particulate arrestance (HEPA) vacuum is best.  Smoke. This includes campfire smoke, forest fire smoke, and secondhand smoke from tobacco products.  Pet dander. Avoid contact with animals that you know you are allergic to.  Allergens from trees, grasses or pollens. Avoid spending a lot of time outdoors when pollen counts are high, and on very windy days.  Very cold, dry, or humid air.  Mold.  Foods that contain high amounts of sulfites.  Strong odors.  Outdoor air pollutants, such as Lexicographer.  Indoor air pollutants, such as aerosol sprays and fumes from household cleaners.  Household pests, including dust mites and cockroaches, and pest droppings.  Certain medicines, including NSAIDs. Always talk to your health care provider before stopping or starting any new medicines. Medicines Take over-the-counter and prescription medicines only as told by your health care provider. Many asthma attacks can be prevented by carefully following your medicine schedule. Taking your medicines correctly is especially important when you cannot avoid certain asthma triggers. Act Quickly If an asthma attack does happen, acting quickly can decrease how severe it  is and how long it lasts. Take these steps:   Pay attention to your symptoms. If you are coughing, wheezing, or having difficulty breathing, do not wait to see if your symptoms go away on their own. Follow your asthma action plan.  If you have followed your asthma action plan and your symptoms are not  improving, call your health care provider or seek immediate medical care at the nearest hospital. It is important to note how often you need to use your fast-acting rescue inhaler. If you are using your rescue inhaler more often, it may mean that your asthma is not under control. Adjusting your asthma treatment plan may help you to prevent future asthma attacks and help you to gain better control of your condition. HOW CAN I PREVENT AN ASTHMA ATTACK WHEN I EXERCISE? Follow advice from your health care provider about whether you should use your fast-acting inhaler before exercising. Many people with asthma experience exercise-induced bronchoconstriction (EIB). This condition often worsens during vigorous exercise in cold, humid, or dry environments. Usually, people with EIB can stay very active by pre-treating with a fast-acting inhaler before exercising.   This information is not intended to replace advice given to you by your health care provider. Make sure you discuss any questions you have with your health care provider.   Document Released: 04/03/2009 Document Revised: 01/04/2015 Document Reviewed: 09/15/2014 Elsevier Interactive Patient Education [employer].

## 2015-03-14 NOTE — Telephone Encounter (Signed)
#   vials:4 Ordered date:03/13/15 Shipping Date:03/15/15

## 2015-03-15 NOTE — Telephone Encounter (Signed)
April [name] from [place], [phone].  Need to know when the next shipment will be needed if it is anytime soon and date.

## 2015-03-15 NOTE — Telephone Encounter (Signed)
Spoke with patient-she will come by the office on Thursday 03/16/15-will ask for [name]-to sign the financial form for [employer]. Once signed I will fax back-usually a 2-3 day turn around time to process; Pt is aware that forms must be completed and processed prior to [employer] sending Xolair.

## 2015-03-15 NOTE — Telephone Encounter (Signed)
Called [phone] and spoke with Verdis Frederickson at [facility]. She states that the case was closed because the approval expired. The approval was good from 01/25/14-01/26/15. Verdis Frederickson explained that [employer] needs the statement of medical necessity and financial forms in order to reopen the case. She is faxing these forms to [phone] to  ATTN: [name]. Will forward message to [facility] for fyi

## 2015-03-17 ENCOUNTER — Telehealth: Payer: Self-pay | Admitting: Internal Medicine

## 2015-03-17 MED ORDER — PREDNISONE 10 MG PO TABS: ORAL_TABLET | ORAL | Status: DC

## 2015-03-17 NOTE — Telephone Encounter (Signed)
Patient notified. Rx sent to pharmacy. Nothing further needed. Closing encounter

## 2015-03-17 NOTE — Telephone Encounter (Signed)
Pt called stating she will not be able to come in to sign paperwork but will sign it Monday when she comes for appt

## 2015-03-17 NOTE — Telephone Encounter (Signed)
Called spoke with pt. She was recently in hospital and was given prednisone 50 mg QD x 4 days on 03/15/15. She has been to the ED 4 times this months for asthma exacerbation.  Pt is asking for more prednisone to be called in. C/o wheezing, chest tx, dry cough, SOB unchanged.  Please advise Dr. Annamaria Boots thanks  No Known Allergies   Current Outpatient Prescriptions on File Prior to Visit  Medication Sig Dispense Refill  . albuterol (PROVENTIL HFA;VENTOLIN HFA) 108 (90 BASE) MCG/ACT inhaler Inhale 1-2 puffs into the lungs every 4 (four) hours as needed for wheezing or shortness of breath. Patient needs appointment before new Rx can be given 18 g 0  . albuterol (PROVENTIL) (2.5 MG/3ML) 0.083% nebulizer solution Take 3 mLs (2.5 mg total) by nebulization every 6 (six) hours as needed for wheezing or shortness of breath. 150 mL 1  . fexofenadine (ALLEGRA) 60 MG tablet Take 1 tablet (60 mg total) by mouth 2 (two) times daily. 30 tablet 0  . guaiFENesin (MUCINEX) 600 MG 12 hr tablet Take 1 tablet (600 mg total) by mouth 2 (two) times daily as needed. 10 tablet 0  . ipratropium (ATROVENT) 0.02 % nebulizer solution Take 2.5 mLs (0.5 mg total) by nebulization 4 (four) times daily as needed for wheezing or shortness of breath. 75 mL 2  . lisinopril-hydrochlorothiazide (PRINZIDE,ZESTORETIC) 10-12.5 MG per tablet Take 1 tablet by mouth daily. 30 tablet 2  . mometasone-formoterol (DULERA) 200-5 MCG/ACT AERO Inhale 2 puffs into the lungs 2 (two) times daily. Rinse mouth after inhalation 1 Inhaler prn  . nicotine (NICODERM CQ - DOSED IN MG/24 HOURS) 14 mg/24hr patch Place 1 patch (14 mg total) onto the skin daily. 28 patch 0  . omalizumab (XOLAIR) 150 MG injection Inject 150 mg into the skin every 14 (fourteen) days.    . predniSONE (DELTASONE) 50 MG tablet Take 1 tablet (50 mg total) by mouth daily with breakfast. 4 tablet 0  . ranitidine (ZANTAC) 150 MG tablet Take 2 tablets (300 mg total) by mouth at bedtime.  (Patient taking differently: Take 300 mg by mouth every morning. ) 180 tablet 3  . sertraline (ZOLOFT) 50 MG tablet Take 1 tablet (50 mg total) by mouth daily. 30 tablet 1  . zolpidem (AMBIEN) 10 MG tablet Take 10 mg by mouth at bedtime as needed for sleep.     No current facility-administered medications on file prior to visit.

## 2015-03-17 NOTE — Telephone Encounter (Signed)
Dr. Annamaria Boots, did you mean 1mg  or did you want 10mg ?   Please advise

## 2015-03-17 NOTE — Telephone Encounter (Signed)
April [name], please advise if pt came by office. Thanks.

## 2015-03-17 NOTE — Telephone Encounter (Signed)
Prednisone 1- mg, # 50     3 daily x 5 days, then 2 daily

## 2015-03-17 NOTE — Telephone Encounter (Signed)
Sorry, meant for 10 mg prednisone tabs

## 2015-03-20 ENCOUNTER — Encounter: Payer: Self-pay | Admitting: Internal Medicine

## 2015-03-20 ENCOUNTER — Ambulatory Visit (INDEPENDENT_AMBULATORY_CARE_PROVIDER_SITE_OTHER): Payer: Self-pay | Admitting: Internal Medicine

## 2015-03-20 VITALS — BP 138/88 | HR 90 | Ht 66.0 in | Wt 322.2 lb

## 2015-03-20 DIAGNOSIS — J449 Chronic obstructive pulmonary disease, unspecified: Secondary | ICD-10-CM

## 2015-03-20 DIAGNOSIS — G4700 Insomnia, unspecified: Secondary | ICD-10-CM

## 2015-03-20 DIAGNOSIS — Z720 Tobacco use: Secondary | ICD-10-CM

## 2015-03-20 DIAGNOSIS — J4551 Severe persistent asthma with (acute) exacerbation: Secondary | ICD-10-CM

## 2015-03-20 DIAGNOSIS — G4733 Obstructive sleep apnea (adult) (pediatric): Secondary | ICD-10-CM

## 2015-03-20 LAB — NITRIC OXIDE: Nitric Oxide: 12

## 2015-03-20 MED ORDER — ESZOPICLONE 2 MG PO TABS: 2.0000 mg | ORAL_TABLET | Freq: Every evening | ORAL | Status: DC | PRN

## 2015-03-20 NOTE — Assessment & Plan Note (Addendum)
Chronic steroid-dependent asthma aggravated by obesity hypoventilation and deconditioning. Plan-emphasis on compliance with Xolair. Use medications as directed. Call office instead of resorting to ER.

## 2015-03-20 NOTE — Patient Instructions (Signed)
Samples x 2 Anoro Ellipta    Inhale 1 puff, once daily   Try this instead of Dulera for comparison. When you run out, go back to [facility] for Lunesta to compare with Lorrin Mais for sleep  Order- FENO   Order- office spirometry       Dx Asthma severe persistent   Try to get by with prednisone 20 mg daily maintenance if you can

## 2015-03-20 NOTE — Progress Notes (Signed)
Patient ID: April [name], female    DOB: 02/17/1971, 44 y.o.   MRN: [id number]  04/08/13- 89 yoF former smoker followed for chronic severe asthma/ Xolair complicated by OSA/ CPAP,  morbid obesity, HBP, depression, GERD FOLLOWS FOR: still on Xolair; continues to wheeze alot. Feels like since taking clonazepam she wakes up with the feeling of not being able to breath. She describes waking wheezing and short of breath after taking clonazepam. It sounds as if she just sleeps deeply. Ambien caused hallucination when she was taking 2 or 3 at a time. She says she was "given" her CPAP machine at the hospital but with no followup instructions and no explanation as to how to use it.  NPSG 03/31/07- AHI 5.8/ hr, weight 330 lbs. She canceled planned more recent study.  08/02/13- 64 yoF former smoker followed for chronic severe asthma/ Xolair complicated by OSA/ CPAP,  morbid obesity, HBP, depression, GERD ACUTE VISIT: had Xolair today; having increased nasal congestion, wheezing and SOB,headaches-using nebulizers more than usual. Just ran out of Dulera. Visit on prednisone 20 mg daily ear he taking omeprazole only when needed. Aware of reflux at night. Plain spring pollen for increased nasal congestion. 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 CXR 3/21/5 IMPRESSION:  Mild chronic bronchitic changes.  No acute abnormalities.  Electronically Signed  By: Lavonia Dana M.D.  On: 07/17/2013 19:02  12/30/13- 89 yoF former smoker followed for chronic severe asthma/ Xolair complicated by OSA/ CPAP,  morbid obesity, HBP, depression, GERD FOLLOWS FOR: Pt states she has good and bad days. Pt states she has increase SOB in morning when waking up. Pt c/o mild dry cough. Pt denies CP/tightness.  Has Dulera 100, not quite enough help. No rescue inhaler.Has nebulizer. Wants to avoid ER, but frequent trips. Wants to avoid prednisone- bursts drive weight up. None in 2 days. Discussed low maintenance to stabilize. Off  Xolair since divorce stopped her health insurance. Assistance program being explored.  Works third shift.. Significant difficulty initiating and maintaining sleep. OTC diphenhydramine not helpful. CXR 11/23/13 IMPRESSION:  No active cardiopulmonary disease.  Electronically Signed  By: Dereck Ligas M.D.  On: 11/23/2013 12:06   03/30/14- 70 yoF former smoker followed for chronic severe asthma/ Xolair complicated by OSA/ CPAP,  morbid obesity, HBP, depression, GERD FOLLOWS FOR: Post hospital-was kept overnight 11-29 through 11-30 for asthma exacerbation; pt also wants to have her Xolair injection today. Continues to wheeze and not back at baseline. Asthma had been getting worse for 2 days before she went to ER. Still some wheeze and "not back to baseline. 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 Still feels tight with scant white sputum. Taking prednisone 10 mg daily. Throat itches and she coughs at night while working. Continues Xolair but lack of transportation causes occasional missed dose. Chronic insomnia-Ambien is too expensive. Hoping to avoid increased steroid dose because of difficulty with weight control. CXR 03/27/14 IMPRESSION: No acute infiltrate or pulmonary edema. Central mild bronchitic changes. Electronically Signed  By: Lahoma Crocker M.D.  On: 03/27/2014 14:05  10/24/14- 42 yoF former smoker followed for chronic severe asthma/ Xolair complicated by OSA/ CPAP,  morbid obesity, HBP, depression, GERD ACUTE VISIT: Continues to have wheezing episodes; feels as though Ruthe Mannan is no longer working. Many missed appointments, ER trips hoping to avoid co-pay. Nebulizer twice daily. Got a Xolair injection today but has missed many. Wheezing often wakes her or is present when  she wakes up. Says [employer] Mannan was not helpful. Continues prednisone 10 mg twice daily. Office spirometry 10/24/14- severe obstructive airways disease. FVC 2.10/66%, FEV1  1.25/47%, FEV1/FVC 0.59, FEF 25-75 percent 0.64/19%. CXR 09/15/14- IMPRESSION: No pneumonia or effusion. Peribronchial thickening may indicate bronchitis. Electronically Signed  By: Ivar Drape M.D.  On: 09/15/2014 16:14  03/20/15- 44 year old female former smoker followed for chronic severe asthma/Xolair complicated by noncompliance, OSA/CPAP, morbid obesity, HBP, depression, GERD xolair 225mg  q2w-Dr. Annamaria Boots CPAP auto/Advanced Gets xolair thru [facility]- needs financial assistance recertified 2 admissions and 8 ER visits in the last 6 months. She admits she frequently misses Xolair injections. Best control has been with prednisone. She is currently tapering from 30 mg to maintenance 20 mg daily. She says she can't lie down without wheezing on 20 mg daily. Not acutely ill and denies fever, chest pain, purulent sputum. Insomnia bothering her. Asks cheaper alternative to Ambien. Wakes during the night and can't get back to sleep. Usually some wheeze. FENO 12- not elevated while on steroid therapy Office Spirometry 03/20/15-Moderate Restriction Secondary to Obesity and Moderate to Severe Obstruction Secondary to Chronic Asthma CXR 11/14/./16 IMPRESSION: No acute cardiopulmonary process. Electronically Signed  By: Elon Alas M.D.  On: 03/13/2015 05:44  Review of Systems-See HPI Constitutional:   No-   weight loss, night sweats, fevers, chills, +fatigue, lassitude. HEENT:   No-  headaches, difficulty swallowing, tooth/dental problems, sore throat,    sneezing, itching, ear ache, +nasal congestion, post nasal drip,  CV:  No-   chest pain, orthopnea, PND, swelling in lower extremities, anasarca, dizziness, palpitations Resp: +shortness of breath with exertion or at rest.              productive cough,  + non-productive cough,  No- coughing up of blood.              No-   change in color of mucus. +wheezing.   Skin: No-   rash or lesions. GI:  No-   heartburn,  indigestion, abdominal pain, nausea, vomiting,  GU: MS:  No-   joint pain or swelling.   Neuro-     nothing unusual Psych:  No- change in mood or affect. No depression or anxiety.  No memory loss.    Objective:   Physical Exam General- Alert, Oriented, Affect-appropriate, Distress- none acute, + morbid  obesity,  Skin- rash-none, lesions- none, excoriation- none Lymphadenopathy- none Head- atraumatic            Eyes- Gross vision intact, PERRLA, conjunctivae clear secretions            Ears- Hearing, canals-normal            Nose- Clear, no-Septal dev, mucus, polyps, erosion, perforation             Throat- Mallampati II , mucosa clear , drainage- none, tonsils- atrophic,                                  Neck- flexible , trachea midline, no stridor , thyroid nl, carotid no bruit Chest - symmetrical excursion , unlabored           Heart/CV- RRR , no murmur , no gallop  , no rub, nl s1 s2                           - JVD- none , edema- none, stasis  changes- none, varices- none           Lung- +Diminished/ clear now, unlabored.  Wheeze +Inspiratory and expiratory, cough-none,                                                 dullness-none,  rub- none           Chest wall-  Abd-  Br/ Gen/ Rectal- Not done, not indicated Extrem- cyanosis- none, clubbing, none, atrophy- none, strength- nl Neuro- grossly intact to observation

## 2015-03-20 NOTE — Assessment & Plan Note (Signed)
Counseling done. She tells me she is not snoring now and I have to hope that is true

## 2015-03-20 NOTE — Assessment & Plan Note (Signed)
She reports good compliance and control with CPAP. Plan-download

## 2015-03-20 NOTE — Telephone Encounter (Signed)
Pt came by today for appt and has signed all forms for Xolair renewal-forms were faxed back as well. Pt is aware that I have cancelled her 03/21/15 Xolair appt until Xolair meds are here in office.

## 2015-03-20 NOTE — Assessment & Plan Note (Signed)
Discussed sleep hygiene Plan-prescription for Lunesta to see if that would be cheaper for her than Ambien

## 2015-03-20 NOTE — Assessment & Plan Note (Signed)
No progress with weight loss. She has not been able/willing to change lifestyle. Respiratory problems make her a poor surgical candidate at this time.

## 2015-03-21 ENCOUNTER — Telehealth: Payer: Self-pay | Admitting: Internal Medicine

## 2015-03-21 ENCOUNTER — Ambulatory Visit: Payer: Self-pay

## 2015-03-21 NOTE — Telephone Encounter (Signed)
April [name] is no documentation that you called this pt. Did you call her??

## 2015-03-21 NOTE — Telephone Encounter (Signed)
Spoke with rep at [facility] they do not need PAN form as patient signed 12-2013 and is still current. They did not have the correct information. Nothing is needed from our end and they will proceed with patients case.

## 2015-03-21 NOTE — Telephone Encounter (Signed)
It appears that April [name] called patient on my behalf to give her the office spiro results from [place] yesterday;pt is aware of results and no further questions.

## 2015-03-21 NOTE — Telephone Encounter (Signed)
April [name] please contact for shipment schedule of Xolair. Thanks.

## 2015-03-21 NOTE — Telephone Encounter (Signed)
Per last TE: Spoke with rep at [facility] they do not need PAN form as patient signed 12-2013 and is still current. They did not have the correct information. Nothing is needed from our end and they will proceed with patients case.   ------------------ Joellen Jersey - received another phone call from [facility] regarding shipment.

## 2015-03-21 NOTE — Telephone Encounter (Signed)
Called to speak with April [name], spoke with Myrt[facility] Ser unavailable.  Gae Bon say that they are trying to approve the case still and need 1 more form.   Faxed form to [name] 30 minutes ago. Checked fax, do not see anything on fax yet. Requesting that form is completed and faxed back to them. Requesting that [name] call them back to answer some introductory questions.

## 2015-03-22 NOTE — Telephone Encounter (Signed)
#   vials:4 Ordered date:03/22/15 Shipping Date:03/28/15

## 2015-03-29 NOTE — Telephone Encounter (Signed)
#   Vials:4 Arrival Date:03/29/2015  Lot [id number]

## 2015-04-03 ENCOUNTER — Telehealth: Payer: Self-pay | Admitting: Internal Medicine

## 2015-04-03 ENCOUNTER — Encounter (HOSPITAL_COMMUNITY): Payer: Self-pay | Admitting: *Deleted

## 2015-04-03 ENCOUNTER — Emergency Department (HOSPITAL_COMMUNITY)
Admission: EM | Admit: 2015-04-03 | Discharge: 2015-04-03 | Discharge status: Against Medical Advice | Payer: Self-pay | Attending: Emergency Medicine | Admitting: Emergency Medicine

## 2015-04-03 DIAGNOSIS — J449 Chronic obstructive pulmonary disease, unspecified: Secondary | ICD-10-CM | POA: Insufficient documentation

## 2015-04-03 DIAGNOSIS — M545 Low back pain: Secondary | ICD-10-CM | POA: Insufficient documentation

## 2015-04-03 DIAGNOSIS — I10 Essential (primary) hypertension: Secondary | ICD-10-CM | POA: Insufficient documentation

## 2015-04-03 DIAGNOSIS — J45909 Unspecified asthma, uncomplicated: Secondary | ICD-10-CM | POA: Insufficient documentation

## 2015-04-03 MED ORDER — ALBUTEROL SULFATE (2.5 MG/3ML) 0.083% IN NEBU
INHALATION_SOLUTION | RESPIRATORY_TRACT | Status: AC
  Filled 2015-04-03: qty 6

## 2015-04-03 MED ORDER — ALBUTEROL SULFATE (2.5 MG/3ML) 0.083% IN NEBU
5.0000 mg | INHALATION_SOLUTION | Freq: Once | RESPIRATORY_TRACT | Status: AC
  Administered 2015-04-03: 5 mg via RESPIRATORY_TRACT

## 2015-04-03 MED ORDER — AZITHROMYCIN 250 MG PO TABS: ORAL_TABLET | ORAL | Status: DC

## 2015-04-03 MED ORDER — PREDNISONE 10 MG PO TABS: 20.0000 mg | ORAL_TABLET | Freq: Every day | ORAL | Status: DC

## 2015-04-03 NOTE — ED Notes (Signed)
cAlled multiple times for patient to go back to a room. Once @ 1700 and last @ 1714. Will continue moving on to patients still waiting at this time

## 2015-04-03 NOTE — ED Notes (Signed)
Pt reports having increase in asthma symptoms, no relief with nebs and inhalers at home. Has dry cough that is worse at night. spo2 98% at triage. Had recent admission for her asthma. Also has pain to her entire back. Ambulatory at triage.

## 2015-04-03 NOTE — Telephone Encounter (Signed)
Called and spoke with pt  Pt c/o headaches, dry cough, sore throat, and increased wheezing x 3-5 days Pt denies f/c/s, n/v, hemoptysis  Pt requesting rec from CY or an urgent appt to be seen Informed pt that [facility] schedule is full but message would be sent to see about rec Pt voiced understanding  Dr Annamaria Boots, please advise. Thanks   No Known Allergies   Current Outpatient Prescriptions on File Prior to Visit  Medication Sig Dispense Refill  . albuterol (PROVENTIL HFA;VENTOLIN HFA) 108 (90 BASE) MCG/ACT inhaler Inhale 1-2 puffs into the lungs every 4 (four) hours as needed for wheezing or shortness of breath. Patient needs appointment before new Rx can be given 18 g 0  . albuterol (PROVENTIL) (2.5 MG/3ML) 0.083% nebulizer solution Take 3 mLs (2.5 mg total) by nebulization every 6 (six) hours as needed for wheezing or shortness of breath. 150 mL 1  . eszopiclone (LUNESTA) 2 MG TABS tablet Take 1 tablet (2 mg total) by mouth at bedtime as needed for sleep. Take immediately before bedtime 30 tablet 5  . fexofenadine (ALLEGRA) 60 MG tablet Take 1 tablet (60 mg total) by mouth 2 (two) times daily. 30 tablet 0  . guaiFENesin (MUCINEX) 600 MG 12 hr tablet Take 1 tablet (600 mg total) by mouth 2 (two) times daily as needed. 10 tablet 0  . ipratropium (ATROVENT) 0.02 % nebulizer solution Take 2.5 mLs (0.5 mg total) by nebulization 4 (four) times daily as needed for wheezing or shortness of breath. 75 mL 2  . lisinopril-hydrochlorothiazide (PRINZIDE,ZESTORETIC) 10-12.5 MG per tablet Take 1 tablet by mouth daily. 30 tablet 2  . mometasone-formoterol (DULERA) 200-5 MCG/ACT AERO Inhale 2 puffs into the lungs 2 (two) times daily. Rinse mouth after inhalation 1 Inhaler prn  . nicotine (NICODERM CQ - DOSED IN MG/24 HOURS) 14 mg/24hr patch Place 1 patch (14 mg total) onto the skin daily. 28 patch 0  . omalizumab (XOLAIR) 150 MG injection Inject 150 mg into the skin every 14 (fourteen) days.    . predniSONE  (DELTASONE) 10 MG tablet 3 daily x 5 days, then 2 daily   10  . ranitidine (ZANTAC) 150 MG tablet Take 2 tablets (300 mg total) by mouth at bedtime. (Patient taking differently: Take 300 mg by mouth every morning. ) 180 tablet 3  . sertraline (ZOLOFT) 50 MG tablet Take 1 tablet (50 mg total) by mouth daily. 30 tablet 1  . zolpidem (AMBIEN) 10 MG tablet Take 10 mg by mouth at bedtime as needed for sleep.     Current Facility-Administered Medications on File Prior to Visit  Medication Dose Route Frequency Provider Last Rate Last Dose  . albuterol (PROVENTIL) (2.5 MG/3ML) 0.083% nebulizer solution

## 2015-04-03 NOTE — Telephone Encounter (Signed)
Spoke with pt and is aware of recs. She also needs her prednisone refilled. I have done so. Nothing further needed

## 2015-04-03 NOTE — ED Notes (Signed)
Called x 3 no answer 

## 2015-04-03 NOTE — Telephone Encounter (Signed)
Suggest Z pak Be very regular with current asthma meds

## 2015-04-03 NOTE — ED Notes (Signed)
Called for pt with no answer 

## 2015-04-03 NOTE — Telephone Encounter (Signed)
Spoke with pt, states she changed her mind and needs pred refill and zpak sent to [facility] NOT [facility]. Called [facility] to cancel rx's. rx's resent to [facility].   Nothing further needed.

## 2015-04-04 ENCOUNTER — Ambulatory Visit (INDEPENDENT_AMBULATORY_CARE_PROVIDER_SITE_OTHER): Payer: Self-pay

## 2015-04-04 ENCOUNTER — Telehealth: Payer: Self-pay | Admitting: Internal Medicine

## 2015-04-04 DIAGNOSIS — J4540 Moderate persistent asthma, uncomplicated: Secondary | ICD-10-CM

## 2015-04-04 MED ORDER — MOMETASONE FURO-FORMOTEROL FUM 200-5 MCG/ACT IN AERO: 2.0000 | INHALATION_SPRAY | Freq: Two times a day (BID) | RESPIRATORY_TRACT | Status: DC

## 2015-04-04 NOTE — Telephone Encounter (Signed)
Samples of dulera were given to pt.  Nothing further needed.

## 2015-04-05 MED ORDER — OMALIZUMAB 150 MG ~~LOC~~ SOLR
225.0000 mg | Freq: Once | SUBCUTANEOUS | Status: AC
  Administered 2015-04-04: 225 mg via SUBCUTANEOUS

## 2015-04-12 IMAGING — DX DG CHEST 2V
2 series · 2 of 2 positions shown · non-contrast
Comparison: 11/15/2013

CLINICAL DATA: Wheezing, asthma

EXAM:
CHEST  2 VIEW

[chest pa]
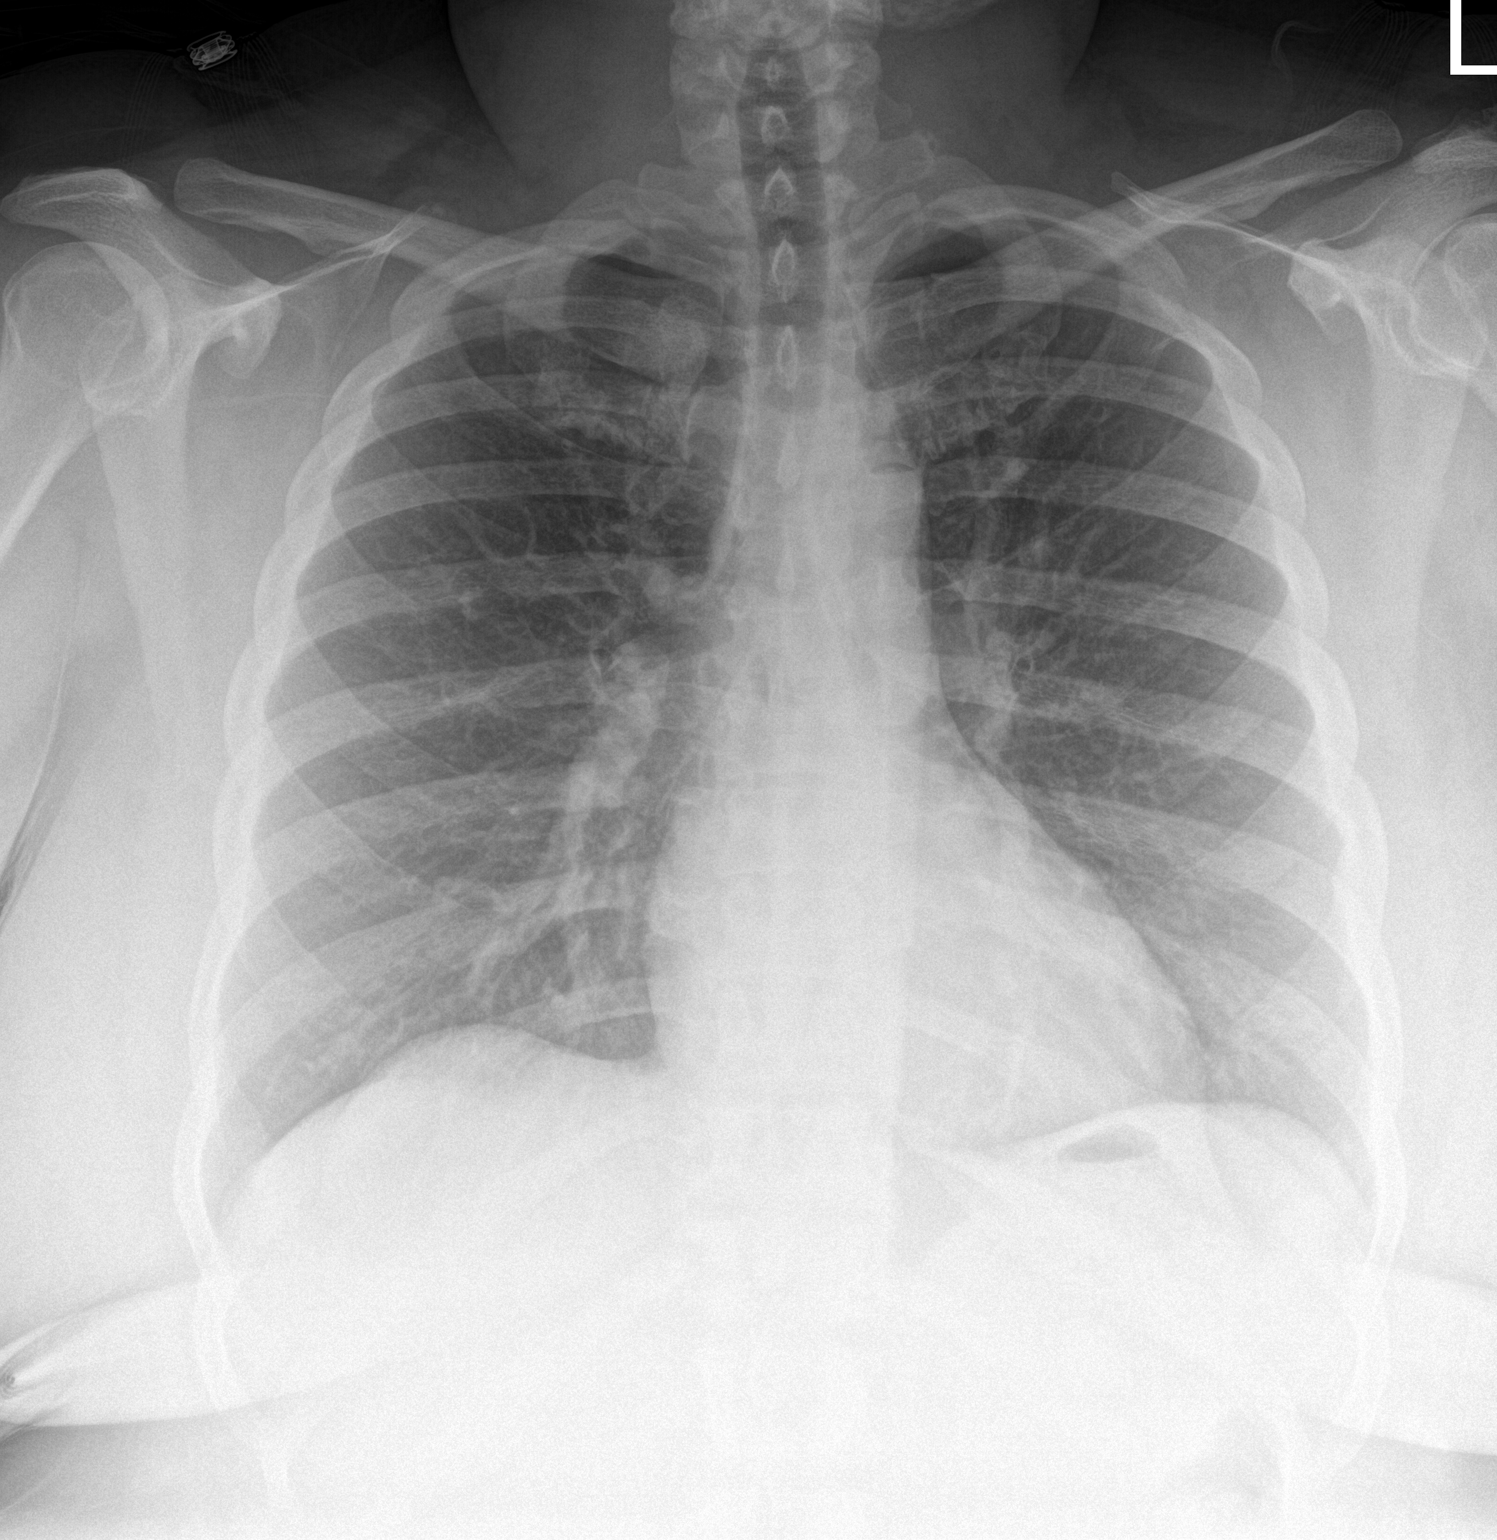

[chest lat]
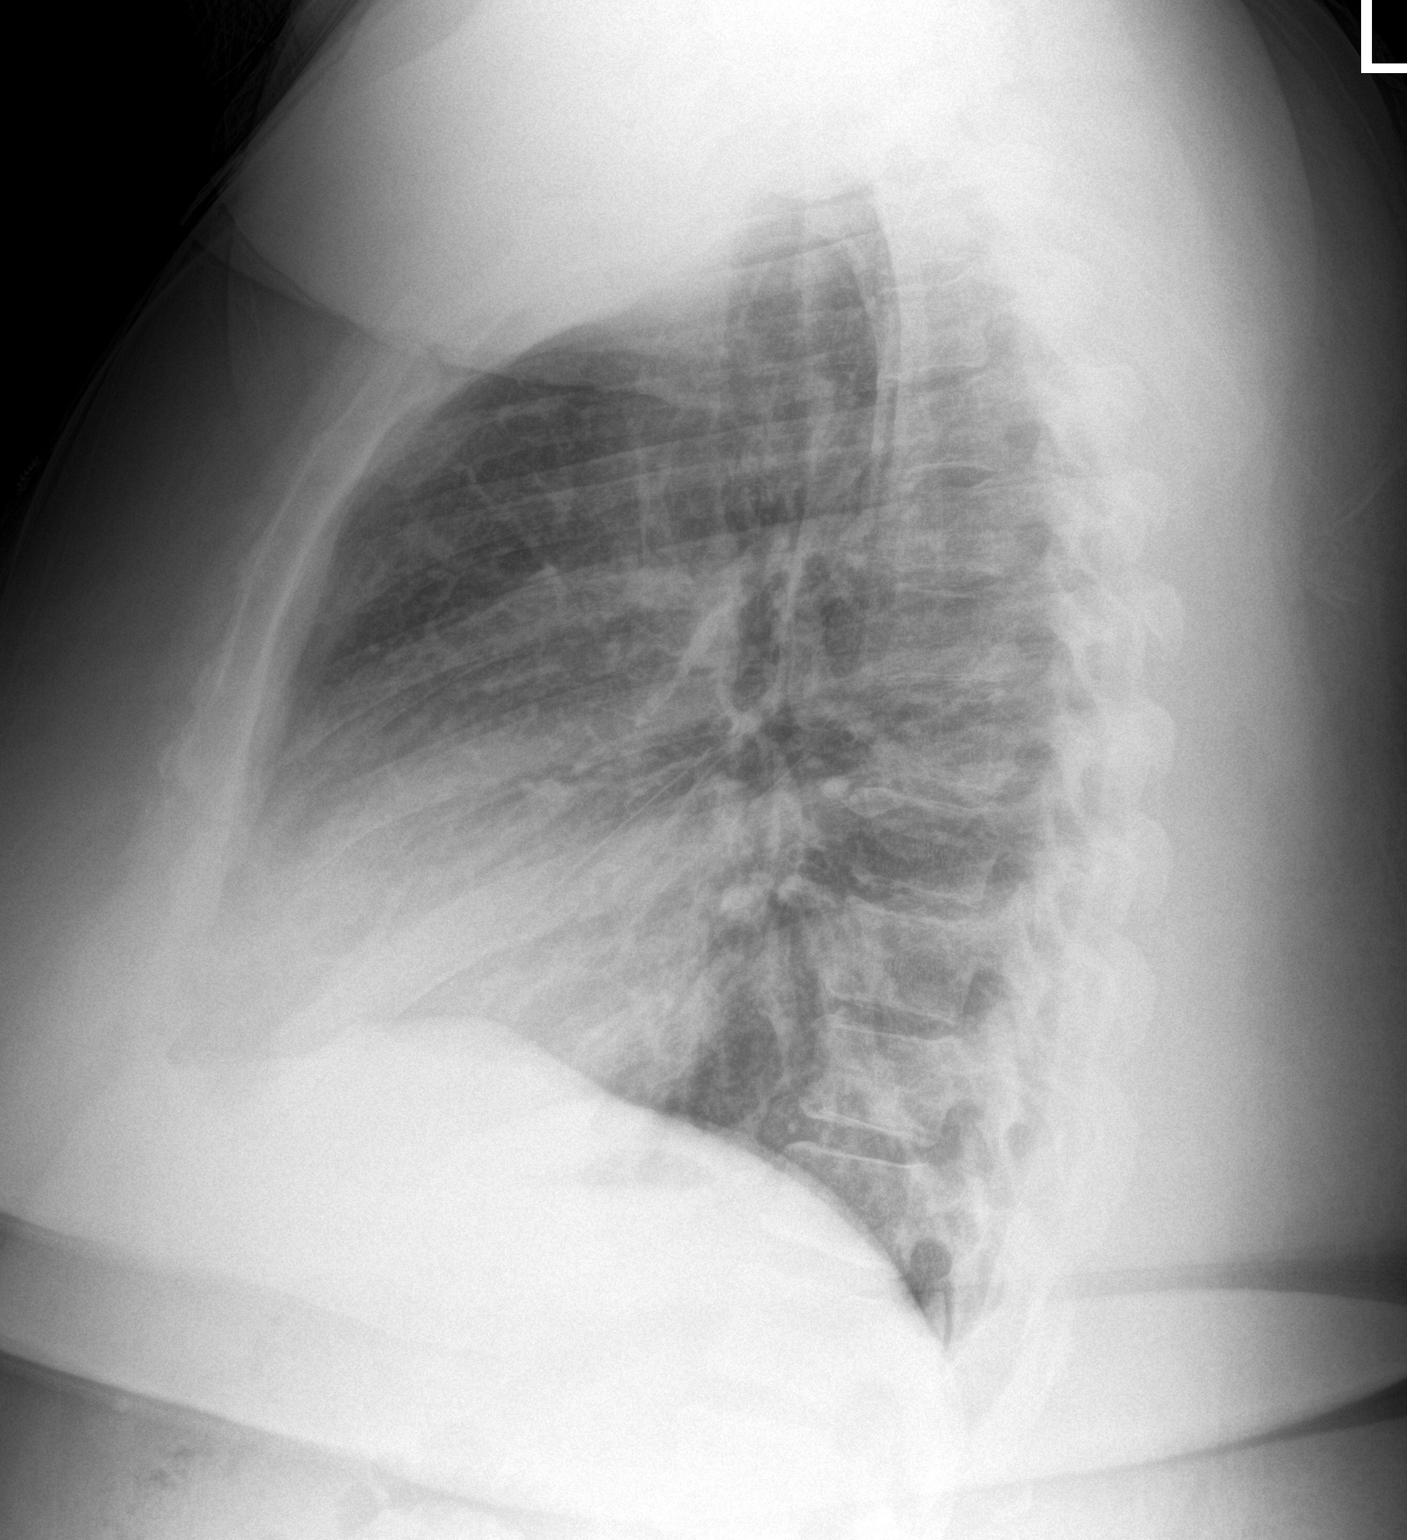

[2 of 2 positions shown; findings below may reference images not displayed]

FINDINGS: Cardiomediastinal silhouette is unremarkable. No acute infiltrate or
pulmonary edema. Central mild bronchitic changes. Bony thorax is
unremarkable.
IMPRESSION: No acute infiltrate or pulmonary edema. Central mild bronchitic
changes.

## 2015-04-14 ENCOUNTER — Telehealth: Payer: Self-pay | Admitting: Internal Medicine

## 2015-04-14 NOTE — Telephone Encounter (Signed)
RX for pred was refilled 04/03/15 #60 Take 2 tabs daily  Is she already out?  Called spoke with pt Mother and LMTCB x1

## 2015-04-17 NOTE — Telephone Encounter (Signed)
Called pt - LM with mother for patient to return call.

## 2015-04-18 ENCOUNTER — Ambulatory Visit: Payer: Self-pay

## 2015-04-18 NOTE — Telephone Encounter (Signed)
Attempted to contact pt. No answer. Will try back. 

## 2015-04-18 NOTE — Telephone Encounter (Signed)
Patient currently being seen by Pulmonology.

## 2015-04-19 NOTE — Telephone Encounter (Signed)
LMTCB

## 2015-04-20 NOTE — Telephone Encounter (Signed)
lmtcb for pt.  

## 2015-04-24 ENCOUNTER — Emergency Department (HOSPITAL_COMMUNITY): Payer: Self-pay

## 2015-04-24 ENCOUNTER — Encounter (HOSPITAL_COMMUNITY): Payer: Self-pay | Admitting: *Deleted

## 2015-04-24 ENCOUNTER — Emergency Department (HOSPITAL_COMMUNITY)
Admission: EM | Admit: 2015-04-24 | Discharge: 2015-04-24 | Disposition: A | Discharge status: Home / Self Care | Payer: Self-pay | Attending: Emergency Medicine | Admitting: Emergency Medicine

## 2015-04-24 DIAGNOSIS — Z872 Personal history of diseases of the skin and subcutaneous tissue: Secondary | ICD-10-CM | POA: Insufficient documentation

## 2015-04-24 DIAGNOSIS — J441 Chronic obstructive pulmonary disease with (acute) exacerbation: Secondary | ICD-10-CM | POA: Insufficient documentation

## 2015-04-24 DIAGNOSIS — Z87891 Personal history of nicotine dependence: Secondary | ICD-10-CM | POA: Insufficient documentation

## 2015-04-24 DIAGNOSIS — J45901 Unspecified asthma with (acute) exacerbation: Secondary | ICD-10-CM

## 2015-04-24 DIAGNOSIS — I10 Essential (primary) hypertension: Secondary | ICD-10-CM | POA: Insufficient documentation

## 2015-04-24 DIAGNOSIS — G4700 Insomnia, unspecified: Secondary | ICD-10-CM | POA: Insufficient documentation

## 2015-04-24 DIAGNOSIS — Z8619 Personal history of other infectious and parasitic diseases: Secondary | ICD-10-CM | POA: Insufficient documentation

## 2015-04-24 DIAGNOSIS — Z7952 Long term (current) use of systemic steroids: Secondary | ICD-10-CM | POA: Insufficient documentation

## 2015-04-24 DIAGNOSIS — Z79899 Other long term (current) drug therapy: Secondary | ICD-10-CM | POA: Insufficient documentation

## 2015-04-24 DIAGNOSIS — K219 Gastro-esophageal reflux disease without esophagitis: Secondary | ICD-10-CM | POA: Insufficient documentation

## 2015-04-24 DIAGNOSIS — Z7951 Long term (current) use of inhaled steroids: Secondary | ICD-10-CM | POA: Insufficient documentation

## 2015-04-24 DIAGNOSIS — M1990 Unspecified osteoarthritis, unspecified site: Secondary | ICD-10-CM | POA: Insufficient documentation

## 2015-04-24 DIAGNOSIS — F329 Major depressive disorder, single episode, unspecified: Secondary | ICD-10-CM | POA: Insufficient documentation

## 2015-04-24 MED ORDER — ALBUTEROL SULFATE (2.5 MG/3ML) 0.083% IN NEBU
INHALATION_SOLUTION | RESPIRATORY_TRACT | Status: AC
  Filled 2015-04-24: qty 6

## 2015-04-24 MED ORDER — ALBUTEROL SULFATE (2.5 MG/3ML) 0.083% IN NEBU
5.0000 mg | INHALATION_SOLUTION | Freq: Once | RESPIRATORY_TRACT | Status: AC
  Administered 2015-04-24: 5 mg via RESPIRATORY_TRACT

## 2015-04-24 MED ORDER — IPRATROPIUM-ALBUTEROL 0.5-2.5 (3) MG/3ML IN SOLN
3.0000 mL | Freq: Once | RESPIRATORY_TRACT | Status: AC
  Administered 2015-04-24: 3 mL via RESPIRATORY_TRACT
  Filled 2015-04-24: qty 3

## 2015-04-24 MED ORDER — DEXAMETHASONE SODIUM PHOSPHATE 10 MG/ML IJ SOLN
10.0000 mg | Freq: Once | INTRAMUSCULAR | Status: AC
  Administered 2015-04-24: 10 mg via INTRAMUSCULAR
  Filled 2015-04-24: qty 1

## 2015-04-24 NOTE — Discharge Instructions (Signed)
Read the information below.  You may return to the Emergency Department at any time for worsening condition or any new symptoms that concern you.  If you develop worsening shortness of breath, uncontrolled wheezing, severe chest pain, or fevers despite using tylenol and/or ibuprofen, return for a recheck.       Asthma, Adult Asthma is a condition of the lungs in which the airways tighten and narrow. Asthma can make it hard to breathe. Asthma cannot be cured, but medicine and lifestyle changes can help control it. Asthma may be started (triggered) by:  Animal skin flakes (dander).  Dust.  Cockroaches.  Pollen.  Mold.  Smoke.  Cleaning products.  Hair sprays or aerosol sprays.  Paint fumes or strong smells.  Cold air, weather changes, and winds.  Crying or laughing hard.  Stress.  Certain medicines or drugs.  Foods, such as dried fruit, potato chips, and sparkling grape juice.  Infections or conditions (colds, flu).  Exercise.  Certain medical conditions or diseases.  Exercise or tiring activities. HOME CARE   Take medicine as told by your doctor.  Use a peak flow meter as told by your doctor. A peak flow meter is a tool that measures how well the lungs are working.  Record and keep track of the peak flow meter's readings.  Understand and use the asthma action plan. An asthma action plan is a written plan for taking care of your asthma and treating your attacks.  To help prevent asthma attacks:  Do not smoke. Stay away from secondhand smoke.  Change your heating and air conditioning filter often.  Limit your use of fireplaces and wood stoves.  Get rid of pests (such as roaches and mice) and their droppings.  Throw away plants if you see mold on them.  Clean your floors. Dust regularly. Use cleaning products that do not smell.  Have someone vacuum when you are not home. Use a vacuum cleaner with a HEPA filter if possible.  Replace carpet with wood,  tile, or vinyl flooring. Carpet can trap animal skin flakes and dust.  Use allergy-proof pillows, mattress covers, and box spring covers.  Wash bed sheets and blankets every week in hot water and dry them in a dryer.  Use blankets that are made of polyester or cotton.  Clean bathrooms and kitchens with bleach. If possible, have someone repaint the walls in these rooms with mold-resistant paint. Keep out of the rooms that are being cleaned and painted.  Wash hands often. GET HELP IF:  You have make a whistling sound when breaking (wheeze), have shortness of breath, or have a cough even if taking medicine to prevent attacks.  The colored mucus you cough up (sputum) is thicker than usual.  The colored mucus you cough up changes from clear or white to yellow, green, gray, or bloody.  You have problems from the medicine you are taking such as:  A rash.  Itching.  Swelling.  Trouble breathing.  You need reliever medicines more than 2-3 times a week.  Your peak flow measurement is still at 50-79% of your personal best after following the action plan for 1 hour.  You have a fever. GET HELP RIGHT AWAY IF:   You seem to be worse and are not responding to medicine during an asthma attack.  You are short of breath even at rest.  You get short of breath when doing very little activity.  You have trouble eating, drinking, or talking.  You have chest  pain.  You have a fast heartbeat.  Your lips or fingernails start to turn blue.  You are light-headed, dizzy, or faint.  Your peak flow is less than 50% of your personal best.   This information is not intended to replace advice given to you by your health care provider. Make sure you discuss any questions you have with your health care provider.   Document Released: 10/02/2007 Document Revised: 01/04/2015 Document Reviewed: 11/12/2012 Elsevier Interactive Patient Education [employer].

## 2015-04-24 NOTE — ED Provider Notes (Signed)
CSN: [id number]     Arrival date & time 04/24/15  1213 History   First MD Initiated Contact with Patient 04/24/15 1357     Chief Complaint  Patient presents with  . Asthma     (Consider location/radiation/quality/duration/timing/severity/associated sxs/prior Treatment) HPI   Pt presents with exacerbation of her asthma x 4 days.  Has had chest tightness, SOB, wheezing x 4 days, not improved by her home inhalers.  She thinks she has a prescription for prednisone at the [facility] but they have been closed and she hasn't been able to get it.  Denies immobilization, unilateral leg swelling, hx blood clots.  Not on exogenous estrogen.  This feels like her typical asthma symptoms.    Past Medical History  Diagnosis Date  . Acanthosis nigricans   . Menorrhagia   . Hypertension, essential   . Insomnia   . Morbid obesity ([place])   . Helicobacter pylori (H. pylori) infection   . Sleep apnea     Sleep study 2008 - mild OSA, not enough events to titrate CPAP  . COPD (chronic obstructive pulmonary disease) ([place])     PFTs in 2002, FEV1/FVC 65, no post bronchodilater test done  . Asthma     Followed by Dr. Annamaria Boots (pulmonology); receives every other week omalizumab injections; has frequent exacerbations  . Depression   . GERD (gastroesophageal reflux disease)   . Tobacco user   . Obesity   . Shortness of breath   . Headache(784.0)   . Seasonal allergies   . Arthritis    Past Surgical History  Procedure Laterality Date  . Tubal ligation  1996    bilateral  . Breast reduction surgery  09/2011   Family History  Problem Relation Age of Onset  . Asthma Daughter   . Cancer Paternal Aunt   . Asthma Maternal Grandmother   . Hypertension Mother    Social History  Substance Use Topics  . Smoking status: Former Smoker -- 18 years    Types: Cigarettes    Quit date: 09/15/2014  . Smokeless tobacco: Former Systems developer    Quit date: 09/12/2014  . Alcohol Use: No   OB History    No data  available     Review of Systems  Constitutional: Negative for fever and chills.  HENT: Negative for trouble swallowing.   Respiratory: Positive for cough, chest tightness, shortness of breath and wheezing.   Cardiovascular: Negative for leg swelling.  Skin: Negative for rash.  Allergic/Immunologic: Negative for immunocompromised state.  Hematological: Does not bruise/bleed easily.  Psychiatric/Behavioral: Negative for self-injury.      Allergies  Review of patient's allergies indicates no known allergies.  Home Medications   Prior to Admission medications   Medication Sig Start Date End Date Taking? Authorizing Provider  albuterol (PROVENTIL HFA;VENTOLIN HFA) 108 (90 BASE) MCG/ACT inhaler Inhale 1-2 puffs into the lungs every 4 (four) hours as needed for wheezing or shortness of breath. Patient needs appointment before new Rx can be given 01/13/15   Arnoldo Morale, MD  albuterol (PROVENTIL) (2.5 MG/3ML) 0.083% nebulizer solution Take 3 mLs (2.5 mg total) by nebulization every 6 (six) hours as needed for wheezing or shortness of breath. 11/18/14   Arnoldo Morale, MD  azithromycin (ZITHROMAX) 250 MG tablet Take as directed 04/03/15   Deneise Lever, MD  eszopiclone (LUNESTA) 2 MG TABS tablet Take 1 tablet (2 mg total) by mouth at bedtime as needed for sleep. Take immediately before bedtime 03/20/15   Deneise Lever,  MD  fexofenadine (ALLEGRA) 60 MG tablet Take 1 tablet (60 mg total) by mouth 2 (two) times daily. 02/08/15   Tatyana Kirichenko, PA-C  guaiFENesin (MUCINEX) 600 MG 12 hr tablet Take 1 tablet (600 mg total) by mouth 2 (two) times daily as needed. 03/14/15   Smiley Houseman, MD  ipratropium (ATROVENT) 0.02 % nebulizer solution Take 2.5 mLs (0.5 mg total) by nebulization 4 (four) times daily as needed for wheezing or shortness of breath. 11/18/14   Arnoldo Morale, MD  lisinopril-hydrochlorothiazide (PRINZIDE,ZESTORETIC) 10-12.5 MG per tablet Take 1 tablet by mouth daily. 11/18/14    Arnoldo Morale, MD  mometasone-formoterol (DULERA) 200-5 MCG/ACT AERO Inhale 2 puffs into the lungs 2 (two) times daily. Rinse mouth after inhalation 04/04/15   Deneise Lever, MD  nicotine (NICODERM CQ - DOSED IN MG/24 HOURS) 14 mg/24hr patch Place 1 patch (14 mg total) onto the skin daily. 03/14/15   Smiley Houseman, MD  omalizumab Arvid Right) 150 MG injection Inject 150 mg into the skin every 14 (fourteen) days.    Historical Provider, MD  predniSONE (DELTASONE) 10 MG tablet Take 2 tablets (20 mg total) by mouth daily with breakfast. 04/03/15   Deneise Lever, MD  ranitidine (ZANTAC) 150 MG tablet Take 2 tablets (300 mg total) by mouth at bedtime. Patient taking differently: Take 300 mg by mouth every morning.  11/03/14   [name], MD  sertraline (ZOLOFT) 50 MG tablet Take 1 tablet (50 mg total) by mouth daily. 11/18/14   Arnoldo Morale, MD  zolpidem (AMBIEN) 10 MG tablet Take 10 mg by mouth at bedtime as needed for sleep.    Historical Provider, MD   BP 157/97 mmHg  Pulse 89  Temp(Src) 97.7 F (36.5 C) (Oral)  Resp 20  Ht 5\' 5"  (1.651 m)  Wt 146.798 kg  BMI 53.85 kg/m2  SpO2 100%  LMP 03/29/2015 Physical Exam  Constitutional: She appears well-developed and well-nourished. No distress.  HENT:  Head: Normocephalic and atraumatic.  Eyes: Conjunctivae are normal.  Neck: Normal range of motion. Neck supple.  Pulmonary/Chest: Effort normal. She has wheezes.  Mild diffuse expiratory wheezes  Musculoskeletal:  Bilateral calves without edema, tenderness   Neurological: She is alert.  Skin: She is not diaphoretic.  Psychiatric: She has a normal mood and affect. Her behavior is normal.  Nursing note and vitals reviewed.   ED Course  Procedures (including critical care time) Labs Review Labs Reviewed - No data to display  Imaging Review Dg Chest 2 View  04/24/2015  CLINICAL DATA:  Acute wheezing, shortness of breath and cough for 4 days EXAM: CHEST  2 VIEW COMPARISON:  03/13/2015  FINDINGS: Mild central bronchitic change. No focal pneumonia, collapse or consolidation. No edema, effusion or pneumothorax. Trachea midline. Normal heart size and vascularity. No acute osseous finding. IMPRESSION: Mild central bronchitic change.  No focal pneumonia. Electronically Signed   By: Jerilynn Mages.  Shick M.D.   On: 04/24/2015 14:00   I have personally reviewed and evaluated these images and lab results as part of my medical decision-making.   EKG Interpretation None      MDM   Final diagnoses:  Asthma exacerbation    Afebrile, nontoxic patient with asthma exacerbation.  Improved with nebs, IM decadron. D/C home with PCP follow up, pt has prednisone prescription waiting for her to pick up from PCP.  Pt also followed by pulmonology for this.  Pt has strong follow up and care team.  Discussed result, findings, treatment, and  follow up  with patient.  Pt given return precautions.  Pt verbalizes understanding and agrees with plan.       I doubt any other EMC precluding discharge at this time including, but not necessarily limited to the following: pulmonary embolism, pneumonia, ACS   Clayton Bibles, PA-C 04/24/15 1533  Dorie Rank, MD 04/25/15 [phone]

## 2015-04-24 NOTE — ED Notes (Signed)
Pt reports wheezing for 4 days. Pt states that it has worsened since last night. Pt reports using her inhaler at home with no relief.

## 2015-04-26 ENCOUNTER — Ambulatory Visit: Payer: Self-pay

## 2015-04-26 ENCOUNTER — Telehealth: Payer: Self-pay | Admitting: Internal Medicine

## 2015-04-26 MED ORDER — PREDNISONE 10 MG PO TABS: 20.0000 mg | ORAL_TABLET | Freq: Every day | ORAL | Status: DC

## 2015-04-26 NOTE — Telephone Encounter (Signed)
Ok to refill prednisone 10 mg, # 60, 2 daily, ref x 2

## 2015-04-26 NOTE — Telephone Encounter (Signed)
Pt c/o asthma flare x past few days - worse in AM and late at night. Pt waking during the night unable to breath, using nebulizer treatments and they seem to control symptoms for only a few hours at a time.  Pt is requesting a refill of Prednisone 10mg .  Last Rx sent 04/03/15 for #60 with instructions to take 2 tablets (20mg ) daily.  Pt states that she is already out. I reviewed the instructions and explained to her that she should have had enough to last her until 05/04/15. Pt states that she thinks she was taking it as a taper. I do not see anywhere in the chart where we told her to take it that way. Patient would not elaborate on how she was using the Pred other than repeating that she took it as it was listed on her bottle. I explained to her again that our Rx stated otherwise. Pt is wanting this refilled today. Please advise Dr Annamaria Boots. Thanks.     Medication List       This list is accurate as of: 04/26/15 11:36 AM.  Always use your most recent med list.               albuterol (2.5 MG/3ML) 0.083% nebulizer solution  Commonly known as:  PROVENTIL  Take 3 mLs (2.5 mg total) by nebulization every 6 (six) hours as needed for wheezing or shortness of breath.     albuterol 108 (90 Base) MCG/ACT inhaler  Commonly known as:  PROVENTIL HFA;VENTOLIN HFA  Inhale 1-2 puffs into the lungs every 4 (four) hours as needed for wheezing or shortness of breath. Patient needs appointment before new Rx can be given     azithromycin 250 MG tablet  Commonly known as:  ZITHROMAX  Take as directed     eszopiclone 2 MG Tabs tablet  Commonly known as:  LUNESTA  Take 1 tablet (2 mg total) by mouth at bedtime as needed for sleep. Take immediately before bedtime     fexofenadine 60 MG tablet  Commonly known as:  ALLEGRA  Take 1 tablet (60 mg total) by mouth 2 (two) times daily.     guaiFENesin 600 MG 12 hr tablet  Commonly known as:  MUCINEX  Take 1 tablet (600 mg total) by mouth 2 (two) times daily as  needed.     ipratropium 0.02 % nebulizer solution  Commonly known as:  ATROVENT  Take 2.5 mLs (0.5 mg total) by nebulization 4 (four) times daily as needed for wheezing or shortness of breath.     lisinopril-hydrochlorothiazide 10-12.5 MG tablet  Commonly known as:  PRINZIDE,ZESTORETIC  Take 1 tablet by mouth daily.     mometasone-formoterol 200-5 MCG/ACT Aero  Commonly known as:  DULERA  Inhale 2 puffs into the lungs 2 (two) times daily. Rinse mouth after inhalation     nicotine 14 mg/24hr patch  Commonly known as:  NICODERM CQ - dosed in mg/24 hours  Place 1 patch (14 mg total) onto the skin daily.     omalizumab 150 MG injection  Commonly known as:  XOLAIR  Inject 150 mg into the skin every 14 (fourteen) days.     predniSONE 10 MG tablet  Commonly known as:  DELTASONE  Take 2 tablets (20 mg total) by mouth daily with breakfast.     ranitidine 150 MG tablet  Commonly known as:  ZANTAC  Take 2 tablets (300 mg total) by mouth at bedtime.     sertraline 50 MG  tablet  Commonly known as:  ZOLOFT  Take 1 tablet (50 mg total) by mouth daily.     zolpidem 10 MG tablet  Commonly known as:  AMBIEN  Take 10 mg by mouth at bedtime as needed for sleep.       No Known Allergies

## 2015-04-26 NOTE — Telephone Encounter (Signed)
Called and spoke with pt. Reviewed CY's recs. Verified pharmacy as [facility]. Rx sent. Pt voiced understanding. Nothing further needed.

## 2015-04-26 NOTE — Telephone Encounter (Signed)
LVM for patient to return call. 

## 2015-04-28 ENCOUNTER — Ambulatory Visit: Payer: Self-pay

## 2015-05-09 MED FILL — ?PREDNISONE 10 MG TABLET: 10 | 30 days supply | Qty: 60 | Fill #1

## 2015-05-20 ENCOUNTER — Encounter (HOSPITAL_COMMUNITY): Payer: Self-pay | Admitting: *Deleted

## 2015-05-20 DIAGNOSIS — I10 Essential (primary) hypertension: Secondary | ICD-10-CM | POA: Insufficient documentation

## 2015-05-20 DIAGNOSIS — J449 Chronic obstructive pulmonary disease, unspecified: Secondary | ICD-10-CM | POA: Insufficient documentation

## 2015-05-20 DIAGNOSIS — R42 Dizziness and giddiness: Secondary | ICD-10-CM | POA: Insufficient documentation

## 2015-05-20 MED ORDER — IPRATROPIUM-ALBUTEROL 0.5-2.5 (3) MG/3ML IN SOLN
3.0000 mL | Freq: Once | RESPIRATORY_TRACT | Status: AC
  Administered 2015-05-20: 3 mL via RESPIRATORY_TRACT

## 2015-05-20 MED ORDER — IPRATROPIUM-ALBUTEROL 0.5-2.5 (3) MG/3ML IN SOLN
RESPIRATORY_TRACT | Status: DC
Start: 2015-05-20 — End: 2015-05-21
  Filled 2015-05-20: qty 3

## 2015-05-20 NOTE — ED Notes (Signed)
Patient presents stating she has been having trouble controlling her asthma and has been getting dizzy when she closes her eyes and this has been happening for about 3 months

## 2015-05-21 ENCOUNTER — Emergency Department (HOSPITAL_COMMUNITY)
Admission: EM | Admit: 2015-05-21 | Discharge: 2015-05-21 | Discharge status: Against Medical Advice | Payer: Self-pay | Attending: Emergency Medicine | Admitting: Emergency Medicine

## 2015-05-21 NOTE — ED Notes (Signed)
Patient called, no answer.

## 2015-05-23 ENCOUNTER — Observation Stay (HOSPITAL_COMMUNITY)
Admission: EM | Admit: 2015-05-23 | Discharge: 2015-05-24 | Disposition: A | Discharge status: Home / Self Care | Payer: Self-pay | Attending: Oncology | Admitting: Oncology

## 2015-05-23 ENCOUNTER — Emergency Department (HOSPITAL_COMMUNITY): Payer: Self-pay

## 2015-05-23 ENCOUNTER — Encounter (HOSPITAL_COMMUNITY): Payer: Self-pay | Admitting: Emergency Medicine

## 2015-05-23 DIAGNOSIS — H8113 Benign paroxysmal vertigo, bilateral: Secondary | ICD-10-CM

## 2015-05-23 DIAGNOSIS — Z7952 Long term (current) use of systemic steroids: Secondary | ICD-10-CM | POA: Insufficient documentation

## 2015-05-23 DIAGNOSIS — F339 Major depressive disorder, recurrent, unspecified: Secondary | ICD-10-CM | POA: Diagnosis present

## 2015-05-23 DIAGNOSIS — G4733 Obstructive sleep apnea (adult) (pediatric): Secondary | ICD-10-CM | POA: Insufficient documentation

## 2015-05-23 DIAGNOSIS — Z7951 Long term (current) use of inhaled steroids: Secondary | ICD-10-CM

## 2015-05-23 DIAGNOSIS — Z79899 Other long term (current) drug therapy: Secondary | ICD-10-CM | POA: Insufficient documentation

## 2015-05-23 DIAGNOSIS — F329 Major depressive disorder, single episode, unspecified: Secondary | ICD-10-CM | POA: Insufficient documentation

## 2015-05-23 DIAGNOSIS — M1990 Unspecified osteoarthritis, unspecified site: Secondary | ICD-10-CM | POA: Insufficient documentation

## 2015-05-23 DIAGNOSIS — Z9989 Dependence on other enabling machines and devices: Secondary | ICD-10-CM

## 2015-05-23 DIAGNOSIS — J45901 Unspecified asthma with (acute) exacerbation: Secondary | ICD-10-CM

## 2015-05-23 DIAGNOSIS — R42 Dizziness and giddiness: Secondary | ICD-10-CM | POA: Insufficient documentation

## 2015-05-23 DIAGNOSIS — K219 Gastro-esophageal reflux disease without esophagitis: Secondary | ICD-10-CM | POA: Insufficient documentation

## 2015-05-23 DIAGNOSIS — Z87891 Personal history of nicotine dependence: Secondary | ICD-10-CM | POA: Insufficient documentation

## 2015-05-23 DIAGNOSIS — J4551 Severe persistent asthma with (acute) exacerbation: Principal | ICD-10-CM | POA: Insufficient documentation

## 2015-05-23 DIAGNOSIS — D72829 Elevated white blood cell count, unspecified: Secondary | ICD-10-CM | POA: Insufficient documentation

## 2015-05-23 DIAGNOSIS — G4700 Insomnia, unspecified: Secondary | ICD-10-CM

## 2015-05-23 DIAGNOSIS — Z6841 Body Mass Index (BMI) 40.0 and over, adult: Secondary | ICD-10-CM | POA: Insufficient documentation

## 2015-05-23 DIAGNOSIS — I10 Essential (primary) hypertension: Secondary | ICD-10-CM | POA: Insufficient documentation

## 2015-05-23 LAB — BASIC METABOLIC PANEL
ANION GAP: 15 (ref 5–15)
BUN: 14 mg/dL (ref 6–20)
CHLORIDE: 103 mmol/L (ref 101–111)
CO2: 24 mmol/L (ref 22–32)
Calcium: 9.2 mg/dL (ref 8.9–10.3)
Creatinine, Ser: 0.77 mg/dL (ref 0.44–1.00)
GFR calc non Af Amer: >60 mL/min (ref >60)
Glucose, Bld: 113 mg/dL — ABNORMAL HIGH (ref 65–99)
POTASSIUM: 4.1 mmol/L (ref 3.5–5.1)
Sodium: 142 mmol/L (ref 135–145)

## 2015-05-23 LAB — CBC WITH DIFFERENTIAL/PLATELET
BASOS PCT: 0 %
Basophils Absolute: 0 10*3/uL (ref 0.0–0.1)
Eosinophils Absolute: 0 10*3/uL (ref 0.0–0.7)
Eosinophils Relative: 0 %
HEMATOCRIT: 41.5 % (ref 36.0–46.0)
HEMOGLOBIN: 13.5 g/dL (ref 12.0–15.0)
LYMPHS ABS: 1.3 10*3/uL (ref 0.7–4.0)
LYMPHS PCT: 7 %
MCH: 28.6 pg (ref 26.0–34.0)
MCHC: 32.5 g/dL (ref 30.0–36.0)
MCV: 87.9 fL (ref 78.0–100.0)
MONOS PCT: 5 %
Monocytes Absolute: 1 10*3/uL (ref 0.1–1.0)
NEUTROS ABS: 17.3 10*3/uL — AB (ref 1.7–7.7)
NEUTROS PCT: 88 %
Platelets: 358 10*3/uL (ref 150–400)
RBC: 4.72 MIL/uL (ref 3.87–5.11)
RDW: 16.9 % — ABNORMAL HIGH (ref 11.5–15.5)
WBC: 19.6 10*3/uL — ABNORMAL HIGH (ref 4.0–10.5)

## 2015-05-23 LAB — POC URINE PREG, ED: Preg Test, Ur: NEGATIVE

## 2015-05-23 MED ORDER — ONDANSETRON HCL 4 MG PO TABS: 4.0000 mg | ORAL_TABLET | Freq: Four times a day (QID) | ORAL | Status: DC | PRN

## 2015-05-23 MED ORDER — LISINOPRIL-HYDROCHLOROTHIAZIDE 10-12.5 MG PO TABS
1.0000 | ORAL_TABLET | Freq: Every day | ORAL | Status: DC
  Filled 2015-05-23: qty 1

## 2015-05-23 MED ORDER — ZOLPIDEM TARTRATE 5 MG PO TABS
10.0000 mg | ORAL_TABLET | Freq: Every evening | ORAL | Status: DC | PRN
  Administered 2015-05-23: 10 mg via ORAL
  Filled 2015-05-23: qty 2

## 2015-05-23 MED ORDER — ALBUTEROL SULFATE (2.5 MG/3ML) 0.083% IN NEBU
5.0000 mg | INHALATION_SOLUTION | Freq: Once | RESPIRATORY_TRACT | Status: AC
  Administered 2015-05-23: 5 mg via RESPIRATORY_TRACT

## 2015-05-23 MED ORDER — BUDESONIDE 0.5 MG/2ML IN SUSP
0.2500 mg | Freq: Two times a day (BID) | RESPIRATORY_TRACT | Status: DC
  Administered 2015-05-23: 0.25 mg via RESPIRATORY_TRACT
  Filled 2015-05-23 (×4): qty 2

## 2015-05-23 MED ORDER — ALBUTEROL SULFATE (2.5 MG/3ML) 0.083% IN NEBU
INHALATION_SOLUTION | RESPIRATORY_TRACT | Status: AC
Start: 2015-05-23 — End: 2015-05-23
  Filled 2015-05-23: qty 6

## 2015-05-23 MED ORDER — METHYLPREDNISOLONE SODIUM SUCC 125 MG IJ SOLR
125.0000 mg | Freq: Once | INTRAMUSCULAR | Status: AC
  Administered 2015-05-23: 125 mg via INTRAVENOUS
  Filled 2015-05-23: qty 2

## 2015-05-23 MED ORDER — ALBUTEROL SULFATE (2.5 MG/3ML) 0.083% IN NEBU
INHALATION_SOLUTION | RESPIRATORY_TRACT | Status: AC
  Filled 2015-05-23: qty 6

## 2015-05-23 MED ORDER — ALBUTEROL SULFATE (2.5 MG/3ML) 0.083% IN NEBU: 5.0000 mg | INHALATION_SOLUTION | Freq: Once | RESPIRATORY_TRACT | Status: DC

## 2015-05-23 MED ORDER — LORATADINE 10 MG PO TABS
10.0000 mg | ORAL_TABLET | Freq: Every day | ORAL | Status: DC
  Administered 2015-05-23 – 2015-05-24 (×2): 10 mg via ORAL
  Filled 2015-05-23 (×2): qty 1

## 2015-05-23 MED ORDER — HYDROCHLOROTHIAZIDE 12.5 MG PO CAPS
12.5000 mg | ORAL_CAPSULE | Freq: Every day | ORAL | Status: DC
  Administered 2015-05-23: 12.5 mg via ORAL
  Filled 2015-05-23: qty 1

## 2015-05-23 MED ORDER — NICOTINE 14 MG/24HR TD PT24
14.0000 mg | MEDICATED_PATCH | Freq: Every day | TRANSDERMAL | Status: DC
  Administered 2015-05-23 – 2015-05-24 (×2): 14 mg via TRANSDERMAL
  Filled 2015-05-23 (×2): qty 1

## 2015-05-23 MED ORDER — ALBUTEROL SULFATE (2.5 MG/3ML) 0.083% IN NEBU
INHALATION_SOLUTION | RESPIRATORY_TRACT | Status: AC
  Filled 2015-05-23: qty 3

## 2015-05-23 MED ORDER — MAGNESIUM SULFATE 2 GM/50ML IV SOLN
2.0000 g | Freq: Once | INTRAVENOUS | Status: AC
  Administered 2015-05-23: 2 g via INTRAVENOUS
  Filled 2015-05-23: qty 50

## 2015-05-23 MED ORDER — IPRATROPIUM BROMIDE 0.02 % IN SOLN
0.5000 mg | Freq: Once | RESPIRATORY_TRACT | Status: AC
  Administered 2015-05-23: 0.5 mg via RESPIRATORY_TRACT
  Filled 2015-05-23: qty 2.5

## 2015-05-23 MED ORDER — ALBUTEROL (5 MG/ML) CONTINUOUS INHALATION SOLN
10.0000 mg/h | INHALATION_SOLUTION | RESPIRATORY_TRACT | Status: DC
  Administered 2015-05-23: 10 mg/h via RESPIRATORY_TRACT
  Filled 2015-05-23: qty 20

## 2015-05-23 MED ORDER — PANTOPRAZOLE SODIUM 40 MG PO TBEC
40.0000 mg | DELAYED_RELEASE_TABLET | Freq: Every day | ORAL | Status: DC
  Administered 2015-05-23 – 2015-05-24 (×2): 40 mg via ORAL
  Filled 2015-05-23 (×2): qty 1

## 2015-05-23 MED ORDER — PREDNISONE 50 MG PO TABS
60.0000 mg | ORAL_TABLET | Freq: Every day | ORAL | Status: DC
  Administered 2015-05-24: 60 mg via ORAL
  Filled 2015-05-23: qty 1

## 2015-05-23 MED ORDER — ENOXAPARIN SODIUM 80 MG/0.8ML ~~LOC~~ SOLN
70.0000 mg | SUBCUTANEOUS | Status: DC
  Administered 2015-05-23: 70 mg via SUBCUTANEOUS
  Filled 2015-05-23: qty 0.8

## 2015-05-23 MED ORDER — SERTRALINE HCL 50 MG PO TABS
50.0000 mg | ORAL_TABLET | Freq: Every day | ORAL | Status: DC
  Administered 2015-05-23 – 2015-05-24 (×2): 50 mg via ORAL
  Filled 2015-05-23 (×2): qty 1

## 2015-05-23 MED ORDER — ALBUTEROL SULFATE (2.5 MG/3ML) 0.083% IN NEBU
5.0000 mg | INHALATION_SOLUTION | Freq: Once | RESPIRATORY_TRACT | Status: AC
  Administered 2015-05-23: 5 mg via RESPIRATORY_TRACT
  Filled 2015-05-23: qty 6

## 2015-05-23 MED ORDER — ONDANSETRON HCL 4 MG/2ML IJ SOLN: 4.0000 mg | Freq: Four times a day (QID) | INTRAMUSCULAR | Status: DC | PRN

## 2015-05-23 MED ORDER — LISINOPRIL 10 MG PO TABS
10.0000 mg | ORAL_TABLET | Freq: Every day | ORAL | Status: DC
  Administered 2015-05-23 – 2015-05-24 (×2): 10 mg via ORAL
  Filled 2015-05-23 (×2): qty 1

## 2015-05-23 MED ORDER — ALBUTEROL SULFATE (2.5 MG/3ML) 0.083% IN NEBU
2.5000 mg | INHALATION_SOLUTION | RESPIRATORY_TRACT | Status: DC
  Administered 2015-05-23 – 2015-05-24 (×6): 2.5 mg via RESPIRATORY_TRACT
  Filled 2015-05-23 (×6): qty 3

## 2015-05-23 NOTE — Progress Notes (Signed)
NURSING PROGRESS NOTE  April [name] [id number] Admission Data: 05/23/2015 6:46 PM Attending Provider: Annia Belt, MD [id number], Tasrif, MD Code Status: Full Code   April [name] is a 45 y.o. female patient admitted from ED:  -No acute distress noted.  -No complaints of shortness of breath.  -No complaints of chest pain.   Cardiac Monitoring: Box # 15 placed on patient.   Blood pressure 174/84, pulse 84, temperature 99.1 F (37.3 C), temperature source Oral, resp. rate 20, height 5\' 5"  (1.651 m), weight 138.347 kg (305 lb), last menstrual period 04/30/2015, SpO2 98 %.   IV Fluids:  IV in place, occlusive dsg intact without redness, IV cath hand right, condition patent and no redness  Allergies:  Review of patient's allergies indicates no known allergies.  Past Medical History:   has a past medical history of Acanthosis nigricans; Menorrhagia; Hypertension, essential; Insomnia; Morbid obesity ([place]); Helicobacter pylori (H. pylori) infection; Sleep apnea; COPD (chronic obstructive pulmonary disease) ([place]); Asthma; Depression; GERD (gastroesophageal reflux disease); Tobacco user; Obesity; Shortness of breath; Headache(784.0); Seasonal allergies; and Arthritis.  Past Surgical History:   has past surgical history that includes Tubal ligation (1996) and Breast reduction surgery (09/2011).  Social History:   reports that she quit smoking about 8 months ago. Her smoking use included Cigarettes. She quit after 18 years of use. She quit smokeless tobacco use about 8 months ago. She reports that she does not drink alcohol or use illicit drugs.    Patient/Family orientated to room. Information packet given to patient/family. Admission inpatient armband information verified with patient/family to include name and date of birth and placed on patient arm. Side rails up x 2, fall assessment and education completed with patient/family. Patient/family able to verbalize understanding of risk  associated with falls and verbalized understanding to call for assistance before getting out of bed. Call light within reach. Patient/family able to voice and demonstrate understanding of unit orientation instructions.    Will continue to evaluate and treat per MD orders.     Doristine Devoid, RN

## 2015-05-23 NOTE — Progress Notes (Signed)
Spoke to patient regarding primary care resources and the [facility] orange card. Follow up appointment made with the [facility] for Thursday Feb 23,2017 @ 8:45am, pt verbalized understanding. Orange card application provided and explained, pt instructed to contact me once application is complete for an eligibility appointment. My contact information given for any future questions or concerns. No other [place] Specialist needs identified at this time.    [facility] [facility] for [facility] [phone]

## 2015-05-23 NOTE — ED Notes (Signed)
Report attempted 

## 2015-05-23 NOTE — Progress Notes (Signed)
Notified MD oncall that pt's BP is 186/89 and orthostatic VS results are high. Gave pt scheduled lisinopril and HCTZ. Md gave no new orders. Will continue to monitor pt. Ranelle Oyster, RN

## 2015-05-23 NOTE — Progress Notes (Signed)
Pre treatment peak flow of 200 with good patient effort.  Post treatment peak flow of 250 with good patient effort.

## 2015-05-23 NOTE — ED Provider Notes (Signed)
CSN: [id number]     Arrival date & time 05/23/15  0750 History   First MD Initiated Contact with Patient 05/23/15 0932     Chief Complaint  Patient presents with  . Asthma     (Consider location/radiation/quality/duration/timing/severity/associated sxs/prior Treatment) HPI   Blood pressure 154/87, pulse 95, temperature 97.8 F (36.6 C), temperature source Oral, resp. rate 22, height 5\' 5"  (1.651 m), weight 138.347 kg, last menstrual period 04/30/2015, SpO2 97 %.  April [name] is a 45 y.o. female with past medical history significant for morbid obesity, COPD complaining of shortness of breath and wheezing worsening over the course of one week. Patient gave herself 5 nebulizer treatments yesterday with little relief. She denies fever, chills, chest pain, rhinorrhea, calf pain or leg swelling, history of DVT or PE  Past Medical History  Diagnosis Date  . Acanthosis nigricans   . Menorrhagia   . Hypertension, essential   . Insomnia   . Morbid obesity ([place])   . Helicobacter pylori (H. pylori) infection   . Sleep apnea     Sleep study 2008 - mild OSA, not enough events to titrate CPAP  . COPD (chronic obstructive pulmonary disease) ([place])     PFTs in 2002, FEV1/FVC 65, no post bronchodilater test done  . Asthma     Followed by Dr. Annamaria Boots (pulmonology); receives every other week omalizumab injections; has frequent exacerbations  . Depression   . GERD (gastroesophageal reflux disease)   . Tobacco user   . Obesity   . Shortness of breath   . Headache(784.0)   . Seasonal allergies   . Arthritis    Past Surgical History  Procedure Laterality Date  . Tubal ligation  1996    bilateral  . Breast reduction surgery  09/2011   Family History  Problem Relation Age of Onset  . Asthma Daughter   . Cancer Paternal Aunt   . Asthma Maternal Grandmother   . Hypertension Mother    Social History  Substance Use Topics  . Smoking status: Former Smoker -- 18 years    Types: Cigarettes     Quit date: 09/15/2014  . Smokeless tobacco: Former Systems developer    Quit date: 09/12/2014  . Alcohol Use: No   OB History    No data available     Review of Systems  10 systems reviewed and found to be negative, except as noted in the HPI.   Allergies  Review of patient's allergies indicates no known allergies.  Home Medications   Prior to Admission medications   Medication Sig Start Date End Date Taking? Authorizing Provider  albuterol (PROVENTIL HFA;VENTOLIN HFA) 108 (90 BASE) MCG/ACT inhaler Inhale 1-2 puffs into the lungs every 4 (four) hours as needed for wheezing or shortness of breath. Patient needs appointment before new Rx can be given 01/13/15  Yes Arnoldo Morale, MD  albuterol (PROVENTIL) (2.5 MG/3ML) 0.083% nebulizer solution Take 3 mLs (2.5 mg total) by nebulization every 6 (six) hours as needed for wheezing or shortness of breath. 11/18/14  Yes Arnoldo Morale, MD  eszopiclone (LUNESTA) 2 MG TABS tablet Take 1 tablet (2 mg total) by mouth at bedtime as needed for sleep. Take immediately before bedtime 03/20/15  Yes Deneise Lever, MD  guaiFENesin (MUCINEX) 600 MG 12 hr tablet Take 1 tablet (600 mg total) by mouth 2 (two) times daily as needed. 03/14/15  Yes Smiley Houseman, MD  ipratropium (ATROVENT) 0.02 % nebulizer solution Take 2.5 mLs (0.5 mg total) by nebulization  4 (four) times daily as needed for wheezing or shortness of breath. 11/18/14  Yes Arnoldo Morale, MD  lisinopril-hydrochlorothiazide (PRINZIDE,ZESTORETIC) 10-12.5 MG per tablet Take 1 tablet by mouth daily. 11/18/14  Yes Arnoldo Morale, MD  mometasone-formoterol (DULERA) 200-5 MCG/ACT AERO Inhale 2 puffs into the lungs 2 (two) times daily. Rinse mouth after inhalation 04/04/15  Yes Deneise Lever, MD  nicotine (NICODERM CQ - DOSED IN MG/24 HOURS) 14 mg/24hr patch Place 1 patch (14 mg total) onto the skin daily. 03/14/15  Yes Smiley Houseman, MD  omalizumab Arvid Right) 150 MG injection Inject 150 mg into the skin every 14  (fourteen) days.   Yes Historical Provider, MD  ranitidine (ZANTAC) 150 MG tablet Take 2 tablets (300 mg total) by mouth at bedtime. 11/03/14  Yes [name], MD  sertraline (ZOLOFT) 50 MG tablet Take 1 tablet (50 mg total) by mouth daily. 11/18/14  Yes Arnoldo Morale, MD  zolpidem (AMBIEN) 10 MG tablet Take 10 mg by mouth at bedtime as needed for sleep.   Yes Historical Provider, MD  azithromycin (ZITHROMAX) 250 MG tablet Take as directed Patient not taking: Reported on 05/23/2015 04/03/15   Deneise Lever, MD  fexofenadine (ALLEGRA) 60 MG tablet Take 1 tablet (60 mg total) by mouth 2 (two) times daily. Patient not taking: Reported on 05/23/2015 02/08/15   Lahoma Rocker Kirichenko, PA-C  predniSONE (DELTASONE) 10 MG tablet Take 2 tablets (20 mg total) by mouth daily with breakfast. Patient not taking: Reported on 05/23/2015 04/26/15   Deneise Lever, MD   BP 169/89 mmHg  Pulse 93  Temp(Src) 99.1 F (37.3 C) (Oral)  Resp 20  Ht 5\' 5"  (1.651 m)  Wt 138.347 kg  BMI 50.75 kg/m2  SpO2 94%  LMP 04/30/2015 Physical Exam  Constitutional: She is oriented to person, place, and time. She appears well-developed and well-nourished. No distress.  Obese  HENT:  Head: Normocephalic.  Eyes: Conjunctivae and EOM are normal.  Cardiovascular: Normal rate, regular rhythm and intact distal pulses.   Pulmonary/Chest: Effort normal. No stridor. No respiratory distress. She has wheezes. She has no rales. She exhibits no tenderness.  Patient speaking in complete sentences, no stridor, severe diffuse expiratory wheezing, adequate air movement.   Abdominal: Soft. Bowel sounds are normal.  Musculoskeletal: Normal range of motion.  Neurological: She is alert and oriented to person, place, and time.  Psychiatric: She has a normal mood and affect.  Nursing note and vitals reviewed.   ED Course  Procedures (including critical care time) Labs Review Labs Reviewed  BASIC METABOLIC PANEL - Abnormal; Notable for the  following:    Glucose, Bld 113 (*)    All other components within normal limits  CBC WITH DIFFERENTIAL/PLATELET - Abnormal; Notable for the following:    WBC 19.6 (*)    RDW 16.9 (*)    Neutro Abs 17.3 (*)    All other components within normal limits  POC URINE PREG, ED    Imaging Review Dg Chest Portable 1 View  05/23/2015  CLINICAL DATA:  Cough and wheezing. EXAM: PORTABLE CHEST 1 VIEW COMPARISON:  04/24/2015 . FINDINGS: Mediastinum and hilar structures normal. The lungs are clear. Heart size normal. No pleural effusion or pneumothorax. No acute bony abnormality . IMPRESSION: No acute cardiopulmonary disease. Electronically Signed   By: Marcello Moores  Register   On: 05/23/2015 10:28   I have personally reviewed and evaluated these images and lab results as part of my medical decision-making.   EKG Interpretation None  MDM   Final diagnoses:  Asthma exacerbation    Filed Vitals:   05/23/15 1009 05/23/15 1231 05/23/15 1232 05/23/15 1300  BP:  161/91 161/91 169/89  Pulse:  106 103 93  Temp:   99.1 F (37.3 C)   TempSrc:   Oral   Resp:   20   Height:      Weight:      SpO2: 98% 95% 99% 94%    Medications  albuterol (PROVENTIL) (2.5 MG/3ML) 0.083% nebulizer solution (  Not Given 05/23/15 1009)  albuterol (PROVENTIL) (2.5 MG/3ML) 0.083% nebulizer solution 5 mg (5 mg Nebulization Not Given 05/23/15 1009)  albuterol (PROVENTIL) (2.5 MG/3ML) 0.083% nebulizer solution (  Not Given 05/23/15 1008)  albuterol (PROVENTIL,VENTOLIN) solution continuous neb (0 mg/hr Nebulization Stopped 05/23/15 1227)  albuterol (PROVENTIL) (2.5 MG/3ML) 0.083% nebulizer solution 5 mg (5 mg Nebulization Given 05/23/15 0759)  magnesium sulfate IVPB 2 g 50 mL (0 g Intravenous Stopped 05/23/15 1122)  methylPREDNISolone sodium succinate (SOLU-MEDROL) 125 mg/2 mL injection 125 mg (125 mg Intravenous Given 05/23/15 1016)  ipratropium (ATROVENT) nebulizer solution 0.5 mg (0.5 mg Nebulization Given 05/23/15 1008)   albuterol (PROVENTIL) (2.5 MG/3ML) 0.083% nebulizer solution 5 mg (5 mg Nebulization Given 05/23/15 1328)    Elisabetta KARISS RUPAR is 45 y.o. female presenting with asthma exacerbation and shortness of breath, patient has a hard he received 2 nebulizers while in the waiting room. She still has significant expiratory wheezing, no respiratory distress. Patient will be started on a continuous nebulizer, given Solu-Medrol IV and magnesium. Patient is afebrile, saturating well on room air, doubt this is pneumonia, will obtain portable chest x-ray. Doubt that there is a DVT/PE. Will reassess.  Chest x-rays without infiltrate. After, continuous hour-long nebulizer with ipratropium patient's wheezing has improved slightly, will need admission for asthma exacerbation.  Unassigned admission to internal medicine teaching service Dr. Denton Brick, Attending Dr. Beryle Beams.           Monico Blitz, PA-C 05/23/15 [place], MD 05/23/15 2312

## 2015-05-23 NOTE — ED Notes (Signed)
C/o 1 week of asthma symptoms with no relief from home meds, speaking  In full sentences, no no distress noted, LS wheezy, A/O X4, NAD

## 2015-05-23 NOTE — ED Notes (Signed)
Unable to get temp at this time due to pt eating ice.

## 2015-05-23 NOTE — ED Notes (Signed)
Pt sitting at side of bed eating dinner tray.

## 2015-05-23 NOTE — H&P (Signed)
Date: 05/23/2015               Patient Name:  April [name] MRN: [id number]  DOB: [birth year] Age / Sex: 45 y.o., female   PCP: April Nims, MD         Medical Service: Internal Medicine Teaching Service         Attending Physician: Dr. Annia Belt, MD    First Contact: Dr. Loleta [name] Pager: [phone]  Second Contact: Dr. Albin [name] Pager: [phone]       After Hours (After 5p/  First Contact Pager: [phone]  weekends / holidays): Second Contact Pager: [phone]   Chief Complaint: "I'm having an asthma exacerbation."  History of Present Illness: [name] is a 45 year old African-American lady with history of severe persistent asthma, obstructive sleep apnea, morbid obesity, gastroesophageal reflux disease, major depressive disorder, presenting with wheezing and shortness of breath for the last week.  She is followed by pulmonologist Dr. Annamaria [name], and has been taking 10-20 mg prednisone daily for the last few years. She is also on LABA/ICS daily, albuterol MDI as needed, and inhaled ipratropium as needed. She had been getting omalizumab injections, but stopped this this when her because they were too expensive. She is been hospitalized about 5 times per year since she was a child, but has never been intubated.   Last month, she moved into a new apartment where she tells me she smells mold; she believes this has been her major trigger since moving. In the last week, her wheezing and shortness of breath has gotten significantly worse. She started taking prednisone 40 mg daily, using her albuterol 10 times per day, and her inhaled ipratropium 3 times daily. However, her wheezing has persisted until it got much worse this morning.  She used to smoke but quit one year ago. She does have significant gastroesophageal reflux disease. One of her neighbors has a pet. She also has hypertension, but does not have any other medical problems.  She denies any fevers, productive cough, chest pain, leg  swelling, history of DVT or blood clots in the family. Review of systems was otherwise nonrevealing.  Meds: Current Facility-Administered Medications  Medication Dose Route Frequency Provider Last Rate Last Dose  . albuterol (PROVENTIL) (2.5 MG/3ML) 0.083% nebulizer solution 5 mg  5 mg Nebulization Once April Morgan, MD   5 mg at 05/23/15 1009  . albuterol (PROVENTIL) (2.5 MG/3ML) 0.083% nebulizer solution           . albuterol (PROVENTIL) (2.5 MG/3ML) 0.083% nebulizer solution            Current Outpatient Prescriptions  Medication Sig Dispense Refill  . albuterol (PROVENTIL HFA;VENTOLIN HFA) 108 (90 BASE) MCG/ACT inhaler Inhale 1-2 puffs into the lungs every 4 (four) hours as needed for wheezing or shortness of breath. Patient needs appointment before new Rx can be given 18 g 0  . albuterol (PROVENTIL) (2.5 MG/3ML) 0.083% nebulizer solution Take 3 mLs (2.5 mg total) by nebulization every 6 (six) hours as needed for wheezing or shortness of breath. 150 mL 1  . eszopiclone (LUNESTA) 2 MG TABS tablet Take 1 tablet (2 mg total) by mouth at bedtime as needed for sleep. Take immediately before bedtime 30 tablet 5  . guaiFENesin (MUCINEX) 600 MG 12 hr tablet Take 1 tablet (600 mg total) by mouth 2 (two) times daily as needed. 10 tablet 0  . ipratropium (ATROVENT) 0.02 % nebulizer solution Take 2.5 mLs (0.5 mg total) by nebulization 4 (  four) times daily as needed for wheezing or shortness of breath. 75 mL 2  . lisinopril-hydrochlorothiazide (PRINZIDE,ZESTORETIC) 10-12.5 MG per tablet Take 1 tablet by mouth daily. 30 tablet 2  . mometasone-formoterol (DULERA) 200-5 MCG/ACT AERO Inhale 2 puffs into the lungs 2 (two) times daily. Rinse mouth after inhalation 2 Inhaler 0  . nicotine (NICODERM CQ - DOSED IN MG/24 HOURS) 14 mg/24hr patch Place 1 patch (14 mg total) onto the skin daily. 28 patch 0  . omalizumab (XOLAIR) 150 MG injection Inject 150 mg into the skin every 14 (fourteen) days.    . ranitidine  (ZANTAC) 150 MG tablet Take 2 tablets (300 mg total) by mouth at bedtime. 180 tablet 3  . sertraline (ZOLOFT) 50 MG tablet Take 1 tablet (50 mg total) by mouth daily. 30 tablet 1  . zolpidem (AMBIEN) 10 MG tablet Take 10 mg by mouth at bedtime as needed for sleep.    [name] azithromycin (ZITHROMAX) 250 MG tablet Take as directed (Patient not taking: Reported on 05/23/2015) 6 tablet 0  . fexofenadine (ALLEGRA) 60 MG tablet Take 1 tablet (60 mg total) by mouth 2 (two) times daily. (Patient not taking: Reported on 05/23/2015) 30 tablet 0  . predniSONE (DELTASONE) 10 MG tablet Take 2 tablets (20 mg total) by mouth daily with breakfast. (Patient not taking: Reported on 05/23/2015) 60 tablet 2    Allergies: Allergies as of 05/23/2015  . (No Known Allergies)   Past Medical History  Diagnosis Date  . Acanthosis nigricans   . Menorrhagia   . Hypertension, essential   . Insomnia   . Morbid obesity ([place])   . Helicobacter pylori (H. pylori) infection   . Sleep apnea     Sleep study 2008 - mild OSA, not enough events to titrate CPAP  . COPD (chronic obstructive pulmonary disease) ([place])     PFTs in 2002, FEV1/FVC 65, no post bronchodilater test done  . Asthma     Followed by Dr. Annamaria [name] (pulmonology); receives every other week omalizumab injections; has frequent exacerbations  . Depression   . GERD (gastroesophageal reflux disease)   . Tobacco user   . Obesity   . Shortness of breath   . Headache(784.0)   . Seasonal allergies   . Arthritis    Past Surgical History  Procedure Laterality Date  . Tubal ligation  1996    bilateral  . Breast reduction surgery  09/2011   Family History  Problem Relation Age of Onset  . Asthma Daughter   . Cancer Paternal Aunt   . Asthma Maternal Grandmother   . Hypertension Mother    Social History   Social History  . Marital Status: Single    Spouse Name: N/A  . Number of Children: 1  . Years of Education: N/A   Occupational History  . FORK LIFT OPERATOR     Social History Main Topics  . Smoking status: Former Smoker -- 18 years    Types: Cigarettes    Quit date: 09/15/2014  . Smokeless tobacco: Former Systems developer    Quit date: 09/12/2014  . Alcohol Use: No  . Drug Use: No  . Sexual Activity: Not on file   Other Topics Concern  . 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     Review of Systems: Per HPI  Physical Exam: Blood pressure 160/95, pulse  100, temperature 99.1 F (37.3 C), temperature source Oral, resp. rate 20, height 5\' 5"  (1.651 m), weight 138.347 kg (305 lb), last menstrual period 04/30/2015, SpO2 96 %. General: friendly obese black lady resting in bed comfortably, appropriately conversational HEENT: no scleral icterus, extra-ocular muscles intact, oropharynx without lesions Cardiac: regular rate and rhythm, no rubs, murmurs or gallops Pulm: breathing well, diffuse end-expiratory wheezes throughout Abd: obese, bowel sounds normal, soft, nondistended, non-tender Ext: warm and well perfused, without pedal edema Lymph: no cervical or supraclavicular lymphadenopathy Skin: no rash, hair, or nail changes Neuro: alert and oriented X3, cranial nerves II-XII grossly intact, moving all extremities well  Lab results: Basic Metabolic Panel:  Recent Labs  05/23/15 1241  NA 142  K 4.1  CL 103  CO2 24  GLUCOSE 113*  BUN 14  CREATININE 0.77  CALCIUM 9.2   CBC:  Recent Labs  05/23/15 1241  WBC 19.6*  NEUTROABS 17.3*  HGB 13.5  HCT 41.5  MCV 87.9  PLT 358   Imaging results:  Dg Chest Portable 1 View  05/23/2015  CLINICAL DATA:  Cough and wheezing. EXAM: PORTABLE CHEST 1 VIEW COMPARISON:  04/24/2015 . FINDINGS: Mediastinum and hilar structures normal. The lungs are clear. Heart size normal. No pleural effusion or pneumothorax. No acute bony abnormality . IMPRESSION: No acute  cardiopulmonary disease. Electronically Signed   By: Marcello Moores  Register   On: 05/23/2015 10:28    Assessment & Plan by Problem:  Asthma exacerbation: She has known severe persistent asthma, has fortunately never been intubated, and is on maximal medical therapy including oral daily steroids. She seems to be compliant with her inhalers and is well-versed in using these. However I question whether she is actually taking the prednisone as prescribed, she tells [place] this drives her weight up and she has only been taking 10 mg daily as opposed to 20 mg daily prescribed by Dr. Annamaria [name]. Notably, she stopped taking the omalizumab for the last few months because it was too expensive. For her acute exacerbation, I think something in her apartment is worsening her reactive airway disease. She also has GERD that is likely contributing, as well as obstructive sleep apnea but she is not using her CPAP machine. I question whether she has a allergic bronchopulmonary aspergillosis but she does not any productive cough suggestive of bronchiectasis or fevers. I don't see any IgE skin testing in the past but she doesn't have eosinophilia on her CBC. Fortunately, she is saturating in the high 90s on room air, and does not appear to be very short of breath. This admission, we will increase the prednisone to 60 mg daily for 5 days. We will continue scheduled albuterol inhalers in addition to her home regimen. We will get her close follow-up with Dr. Annamaria [name] to get her back on the omalizumab is a seems to have been helping her. -Increase prednisone to 60 mg daily, stop date 1/30, then resume 20mg  daily dose thereafter -Continue albuterol nebulizers every 2 hours -Continue mometasone-formoterol inhaler twice daily -Continue pantoprazole 40mg  daily -Continue loratadine 10mg  twice daily -Continue nicotine patch 14mg  daily -Encouraged CPAP use -Will arrange close follow-up with Dr. Annamaria [name] to get her back on the omalizumab  injections  Hypertension: Pressures a little high in 160s/90s, probably from anxiety during asthma exacerbation. -Continue home lisinopril 10mg  daily -Continue HCTZ 12.5mg  daily  Leukocytosis: White count 19.6; I think this is probably from her steroids. Chest x-ray looks clear and there are no localizing signs of infection  Insomnia: She says zolpidem helps her sleep. -Continue zolpidem 10mg  daily  Depression: Her affect is normal. -Continue sertraline 50mg  daily  Dispo: Disposition is deferred at this time, awaiting improvement of current medical problems. Anticipated discharge in approximately 1-2 day(s).   The patient does have a current PCP (Tasrif Ahmed, MD) and does need an [facility] hospital follow-up appointment after discharge.  The patient does not know have transportation limitations that hinder transportation to clinic appointments.  Signed: [name], MD 05/23/2015, 2:25 PM

## 2015-05-23 NOTE — H&P (Signed)
Date: 05/23/2015               Patient Name:  April [name] MRN: [id number]  DOB: 1971-04-15 Age / Sex: 45 y.o., female   PCP: [name], MD              Medical Service: Internal Medicine Teaching Service    Attending Physician: Dr. Annia Belt, MD          Chief Complaint: shortness of breath, cough   History of Present Illness: Ms. April [name] is a 45 year-old woman with history of morbid obesity, asthma, COPD, obstructive sleep apnea, hypertension and gastroesophageal reflux disease who presents with a one week history of worsening cough, shortness of breath, and intermittent dizziness and lightheadedness.   Ms. April [name] reports that she moved to a new home about a month ago and noted a moldy smell and subsequent worsening of her asthma symptoms. Over the past week or so, her symptoms have acutely worsened with near constant cough and shortness of breath and multiple nighttime awakenings each night. She reports using her albuterol rescue inhaler 10-12 times per day, her ipratropium 6 times daily, her mometasone-formoterol twice daily and having increased her normal 10mg  dose of prednisone to 40 mg daily over the past week. Despite this, she notes return of symptoms 1-2 hours after each treatment. Notably, she reports that her symptoms are only present in her house and are virtually nonexistent at work. During this time, she also describes intermittent headache, backache and vertigo and lightheadedness that do not seem to be associated with a particular position or activity.   Ms. [name] is followed by pulmonology for severe persistent asthma and most recently saw Dr. Annamaria Boots in November of 2016. She had been receiving Xolair injections and noted some improvement with these, but reports that she has not had an injection in many weeks. She also uses the LABA/ICS, albuterol MDI and inhaled ipratropium mentioned above. She seems to be using her inhalers regularly, but has not followed her  recommended prednisone dose of 10 mg daily, as noted above. She does not measure her peak flow regularly, but thinks it is somewhere between 400-500 L/min when she is feeling her best. She has been hospitalized 4-6 times per year since childhood for asthma exacerbations, but has never required intubation.  Ms. April [name] denies history of fever or chills, nasal congestion, sore throat, chest pain, abdominal pain, changes in bowel movements, lower extremity swelling or history of DVT. She does note two episodes of vomiting night before last, which were provoked by persistent cough.   Review of Systems: Negative except as noted in HPI.   Meds:  Medication Sig  . albuterol (PROVENTIL HFA;VENTOLIN HFA) 108 (90 BASE) MCG/ACT inhaler Inhale 1-2 puffs into the lungs every 4 (four) hours as needed for wheezing or shortness of breath. Patient needs appointment before new Rx can be given  . albuterol (PROVENTIL) (2.5 MG/3ML) 0.083% nebulizer solution Take 3 mLs (2.5 mg total) by nebulization every 6 (six) hours as needed for wheezing or shortness of breath.  . eszopiclone (LUNESTA) 2 MG TABS tablet Take 1 tablet (2 mg total) by mouth at bedtime as needed for sleep. Take immediately before bedtime  . guaiFENesin (MUCINEX) 600 MG 12 hr tablet Take 1 tablet (600 mg total) by mouth 2 (two) times daily as needed.  [name] ipratropium (ATROVENT) 0.02 % nebulizer solution Take 2.5 mLs (0.5 mg total) by nebulization 4 (four) times daily as needed for wheezing or  shortness of breath.  . lisinopril-hydrochlorothiazide (PRINZIDE,ZESTORETIC) 10-12.5 MG per tablet Take 1 tablet by mouth daily.  . mometasone-formoterol (DULERA) 200-5 MCG/ACT AERO Inhale 2 puffs into the lungs 2 (two) times daily. Rinse mouth after inhalation  . nicotine (NICODERM CQ - DOSED IN MG/24 HOURS) 14 mg/24hr patch Place 1 patch (14 mg total) onto the skin daily.  [name] omalizumab (XOLAIR) 150 MG injection Inject 150 mg into the skin every 14 (fourteen) days.    . ranitidine (ZANTAC) 150 MG tablet Take 2 tablets (300 mg total) by mouth at bedtime.  . sertraline (ZOLOFT) 50 MG tablet Take 1 tablet (50 mg total) by mouth daily.  [name] zolpidem (AMBIEN) 10 MG tablet Take 10 mg by mouth at bedtime as needed for sleep.  [name] azithromycin (ZITHROMAX) 250 MG tablet Take as directed (Patient not taking: Reported on 05/23/2015)  . fexofenadine (ALLEGRA) 60 MG tablet Take 1 tablet (60 mg total) by mouth 2 (two) times daily. (Patient not taking: Reported on 05/23/2015)  . predniSONE (DELTASONE) 10 MG tablet Take 2 tablets (20 mg total) by mouth daily with breakfast. (Patient not taking: Reported on 05/23/2015)   Allergies: No known drug allergies.   Past Medical History: Acanthosis nigricans Menorrhagia Essential hypertension  Insomnia Morbid obesity H. pylori infection  Obstructive sleep apnea, mild (sleep study 2008) COPD (FEV1/FVC 65) Asthma  Depression  Gastroesophageal reflux disease Tobacco use Obesity  Headache  Seasonal Allergies  Arthritis   Past Surgical History: Bilateral tubal ligation (1996) Breast reduction surgery (2013)  Family History: Mother: Hypertension  Daughter: Asthma Maternal grandmother: Asthma Paternal Aunt: Cancer   Social History: Ms. April [name] is single and lives alone. She has a daughter and grandchildren living nearby. 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 She has smoked for 18 years and quit about a year ago. She denies drug and alcohol use.  Physical Exam: Blood pressure 160/95, pulse 100, temperature 99.1 F (37.3 C), temperature source Oral, resp. rate 20, height 5\' 5"  (1.651 m), weight 138.347 kg (305 lb), last menstrual period 04/30/2015, SpO2 96 %. General: Obese African-American female lying comfortably in bed, speaking easily in full sentences  HEENT: EOMI, oropharynx clear, moist mucous membranes Cardio: Regular rate and rhythm, no murmurs, rubs or gallops.  Pulm/Chest: Diffuse end-expiratory  wheezing bilaterally. No crackles. No increased work of breathing.  Abd: Soft, nontender, nondistended.  MSK: No lower extremity edema. Skin: Warm, dry.  Neuro: Alert and oriented x 3. Grossly normal sensation and movement.  Psych: Appropriate behavior and affect.   Lab results: CBC notable for WBC 19.6, ANC 17.3, RDW 16.9. BMP unremarkable.  b-hCG negative.   Imaging results:  Dg Chest Portable 1 View 05/23/2015  IMPRESSION: No acute cardiopulmonary disease.  Assessment & Plan: Ms. Shniya Ancel is a 45 year-old woman with history of morbid obesity, asthma, COPD, obstructive sleep apnea, hypertension and gastroesophageal reflux disease who presents with a one week history of worsening cough, shortness of breath, and intermittent dizziness and lightheadedness.   1. Acute exacerbation of severe persistent asthma: She has known severe persistent asthma on maximal medical therapy including daily oral steroids and has a significant history of exacerbations requiring hospitalization, though never has been intubated. She reports good compliance with her inhalers, but poor compliance with her Xolair injections over the past several weeks due to cost. She has been self-adjusting her prednisone based on daily symptoms rather than taking it as prescribed. It sounds as though her current exacerbation was brought on by  mold or another allergen in her new home. Additionally, her underlying GERD and obstructive sleep apnea are probably exacerbating her symptoms, as described below. In the emergency department, she was given albuterol and ipratropium nebulizations, Solu-Medrol and 2g IV magnesium sulfate with some improvement in her symptoms, but continued to have wheezing and shortness of breath above her baseline. Reassuringly, when we saw her, she was saturating above 95% on room air, had normal work of breathing, and was not dyspneic in conversation. We will treat both the asthma exacerbation and underlying  exacerbating conditions, as below.  - Prednisone 60 mg daily x 5 days (stop 1/30) - Albuterol 2.5 mg nebulization every 4 hours  - Budesonide 0.25 mg nebulization twice daily  - Continue mometasone-formoterol inhaler twice daily - Measure peak flows - Supplemental oxygen via nasal cannula as necessary   2. Vertigo and lightheadedness: Her vertigo symptoms sound like BPPV, though she says that she has vertigo both lying, sitting and standing, and it does not seem that the degree of symptoms changes with movement. She describes some associated tinnitus, though her history suggests this is infrequent and not associated with her current episodes of vertigo. She denies severe ear fullness or nausea associated with Meniere's disease. Alternatively, she may simply be feeling lightheaded secondary to orthostatic hypotension. We will check orthostatic blood pressures and consider Dix-Hallpike and Epley maneuvers tomorrow as further BPPV workup.  - Check orthostatic blood pressures - Consider Dix-Hallpike and Epley maneuvers tomorrow - Could also consider meclizine if vertigo begins to cause nausea  2. Obstructive sleep apnea: This is likely worsening the dyspnea associated with her asthma and playing a role in her reported nighttime awakenings. She has a CPAP machine, but reports poor compliance with this.  - Recommend increased use of CPAP  3. Hypertension: Elevated pressures to 99991111 systolic in the emergency department, likely due to anxiety over current asthma exacerbation.  - Continue home lisinopril 10 mg daily  - Continue home hydrochlorothiazide 12.5 mg daily   4. GERD: She reports longstanding symptoms even with water or other fluids. This is likely contributing to her cough and worsening her underlying asthma.  - Continue home ranitidine 300 mg at bedtime - Start antoprazole 40 mg daily    5. Leukocytosis: WBC of 19.6 in the emergency department. Reviewing her chart, she appears to have a  chronically elevated WBC count dating as far back as 5 years ago. She is afebrile with no sputum production and a clean chest x-ray, so I suspect that this is secondary to her chronic steroid use rather than an infectious source.  - Consider further workup if pt becomes febrile or clinically worsens  5. Depression: Normal affect on exam today.  - Continue home zoloft 50 mg daily  - Continue home lunesta 2 mg nightly as needed for sleep - Hold home ambien 10 mg nightly   6. Tobacco abuse:  - Nicotine 14 mg patch daily   DVT PPx: Lovenox 40 mg SQ daily   FEN: Fluids: none Electrolytes: wnl Nutrition: regular diet   Code Status: Full   Dispo: Pending resolution of current symptoms. Anticipate discharge in 2-3 days.   This is a Careers information officer Note.  The care of the patient was discussed with Dr. Melburn Hake and the assessment and plan was formulated with their assistance.  Please see their note for official documentation of the patient encounter.   Everlene Balls, [facility]  05/23/2015, 2:23 PM

## 2015-05-23 NOTE — ED Notes (Signed)
Portable xray at bedside.

## 2015-05-24 MED ORDER — IBUPROFEN 400 MG PO TABS
800.0000 mg | ORAL_TABLET | Freq: Once | ORAL | Status: AC
  Administered 2015-05-24: 800 mg via ORAL
  Filled 2015-05-24: qty 2

## 2015-05-24 MED ORDER — ALBUTEROL SULFATE (2.5 MG/3ML) 0.083% IN NEBU
INHALATION_SOLUTION | RESPIRATORY_TRACT | Status: AC
  Administered 2015-05-24: 2.5 mg via RESPIRATORY_TRACT
  Filled 2015-05-24: qty 3

## 2015-05-24 MED ORDER — BUDESONIDE 0.25 MG/2ML IN SUSP
0.2500 mg | Freq: Two times a day (BID) | RESPIRATORY_TRACT | Status: DC
  Administered 2015-05-24: 0.25 mg via RESPIRATORY_TRACT

## 2015-05-24 MED ORDER — HYDROCHLOROTHIAZIDE 25 MG PO TABS
25.0000 mg | ORAL_TABLET | Freq: Every day | ORAL | Status: DC
  Administered 2015-05-24: 25 mg via ORAL
  Filled 2015-05-24: qty 1

## 2015-05-24 MED ORDER — PREDNISONE 10 MG PO TABS: ORAL_TABLET | ORAL | Status: DC

## 2015-05-24 MED ORDER — ALBUTEROL SULFATE (2.5 MG/3ML) 0.083% IN NEBU
2.5000 mg | INHALATION_SOLUTION | RESPIRATORY_TRACT | Status: DC | PRN
  Administered 2015-05-24 (×3): 2.5 mg via RESPIRATORY_TRACT
  Filled 2015-05-24 (×2): qty 3

## 2015-05-24 MED ORDER — ACETAMINOPHEN 325 MG PO TABS: 650.0000 mg | ORAL_TABLET | Freq: Four times a day (QID) | ORAL | Status: DC | PRN

## 2015-05-24 MED ORDER — ACETAMINOPHEN 325 MG PO TABS
650.0000 mg | ORAL_TABLET | Freq: Four times a day (QID) | ORAL | Status: DC | PRN
  Filled 2015-05-24: qty 2

## 2015-05-24 MED FILL — predniSONE 10 MG TABS: 10 | 15 days supply | Qty: 60 | Fill #0

## 2015-05-24 NOTE — Progress Notes (Signed)
Patient ID: April [name], female   DOB: 1970/09/26, 45 y.o.   MRN: [id number]   Subjective: April [name] is feeling back to her baseline this morning and is hoping to go home.  Objective: Vital signs in last 24 hours: Filed Vitals:   05/24/15 0427 05/24/15 0529 05/24/15 0620 05/24/15 0755  BP:   150/81   Pulse: 88 73 73   Temp:  98 F (36.7 C) 98 F (36.7 C)   TempSrc:  Oral Oral   Resp: 20 18 18    Height:      Weight:      SpO2:    98%   Physical Exam:  General: friendly obese black lady resting in bed comfortably, appropriately conversational Cardiac: regular rate and rhythm, no rubs, murmurs or gallops Pulm: breathing well, diffuse end-expiratory wheezes throughout, slightly better compared to yesterday Abd: obese, bowel sounds normal, soft, nondistended, non-tender Ext: warm and well perfused, without pedal edema  Medications: I have reviewed the patient's current medications. Scheduled Meds: . albuterol  2.5 mg Nebulization Q4H  . budesonide (PULMICORT) nebulizer solution  0.25 mg Nebulization BID  . enoxaparin (LOVENOX) injection  70 mg Subcutaneous Q24H  . hydrochlorothiazide  12.5 mg Oral Daily  . lisinopril  10 mg Oral Daily  . loratadine  10 mg Oral Daily  . nicotine  14 mg Transdermal Daily  . pantoprazole  40 mg Oral Daily  . predniSONE  60 mg Oral Q breakfast  . sertraline  50 mg Oral Daily   Continuous Infusions:  PRN Meds:.acetaminophen, albuterol, ondansetron **OR** ondansetron (ZOFRAN) IV, zolpidem   Assessment/Plan:  Exacerbation of severe persistent ashtma: She has improved back to her baseline this morning. We will continue prednisone 60 mg for another few days, then have her resume the 20 mg daily dose. We'll get her an appointment with Dr. Annamaria Boots to get back on the omalizumab injections. Her trigger seems to be the mold in her house; she tells April [name] she is getting this treated this week. -Increase prednisone to 60 mg daily, stop date 1/30, then resume 20mg   daily dose thereafter -Continue albuterol nebulizers every 2 hours -Continue mometasone-formoterol inhaler twice daily -Continue pantoprazole 40mg  daily -Continue loratadine 10mg  twice daily -Continue nicotine patch 14mg  daily -Encouraged CPAP use -Will arrange close follow-up with Dr. Annamaria Boots to get her back on the omalizumab injections  Hypertension: Pressures a little high in 160s/90s; will increase thiazide. -Continue home lisinopril 10mg  daily -Increased HCTZ from 12.5mg  to 25 daily  Dispo: Disposition is deferred at this time, awaiting improvement of current medical problems.  Anticipated discharge in approximately 0-1 day(s).   The patient does have a current PCP (April Ahmed, MD) and does need an [facility] hospital follow-up appointment after discharge.  The patient does not know have transportation limitations that hinder transportation to clinic appointments.  .Services Needed at time of discharge: Y = Yes, Blank = No PT:   OT:   RN:   Equipment:   Other:       April Chance, MD 05/24/2015, 8:09 AM

## 2015-05-24 NOTE — Discharge Instructions (Signed)
Asthma, Acute Bronchospasm °Acute bronchospasm caused by asthma is also referred to as an asthma attack. Bronchospasm means your air passages become narrowed. The narrowing is caused by inflammation and tightening of the muscles in the air tubes (bronchi) in your lungs. This can make it hard to breathe or cause you to wheeze and cough. °CAUSES °Possible triggers are: °· Animal dander from the skin, hair, or feathers of animals. °· Dust mites contained in house dust. °· Cockroaches. °· Pollen from trees or grass. °· Mold. °· Cigarette or tobacco smoke. °· Air pollutants such as dust, household cleaners, hair sprays, aerosol sprays, paint fumes, strong chemicals, or strong odors. °· Cold air or weather changes. Cold air may trigger inflammation. Winds increase molds and pollens in the air. °· Strong emotions such as crying or laughing hard. °· Stress. °· Certain medicines such as aspirin or beta-blockers. °· Sulfites in foods and drinks, such as dried fruits and wine. °· Infections or inflammatory conditions, such as a flu, cold, or inflammation of the nasal membranes (rhinitis). °· Gastroesophageal reflux disease (GERD). GERD is a condition where stomach acid backs up into your esophagus. °· Exercise or strenuous activity. °SIGNS AND SYMPTOMS  °· Wheezing. °· Excessive coughing, particularly at night. °· Chest tightness. °· Shortness of breath. °DIAGNOSIS  °Your health care provider will ask you about your medical history and perform a physical exam. A chest X-ray or blood testing may be performed to look for other causes of your symptoms or other conditions that may have triggered your asthma attack.  °TREATMENT  °Treatment is aimed at reducing inflammation and opening up the airways in your lungs.  Most asthma attacks are treated with inhaled medicines. These include quick relief or rescue medicines (such as bronchodilators) and controller medicines (such as inhaled corticosteroids). These medicines are sometimes  given through an inhaler or a nebulizer. Systemic steroid medicine taken by mouth or given through an IV tube also can be used to reduce the inflammation when an attack is moderate or severe. Antibiotic medicines are only used if a bacterial infection is present.  °HOME CARE INSTRUCTIONS  °· Rest. °· Drink plenty of liquids. This helps the mucus to remain thin and be easily coughed up. Only use caffeine in moderation and do not use alcohol until you have recovered from your illness. °· Do not smoke. Avoid being exposed to secondhand smoke. °· You play a critical role in keeping yourself in good health. Avoid exposure to things that cause you to wheeze or to have breathing problems. °· Keep your medicines up-to-date and available. Carefully follow your health care provider's treatment plan. °· Take your medicine exactly as prescribed. °· When pollen or pollution is bad, keep windows closed and use an air conditioner or go to places with air conditioning. °· Asthma requires careful medical care. See your health care provider for a follow-up as advised. If you are more than [redacted] weeks pregnant and you were prescribed any new medicines, let your obstetrician know about the visit and how you are doing. Follow up with your health care provider as directed. °· After you have recovered from your asthma attack, make an appointment with your outpatient doctor to talk about ways to reduce the likelihood of future attacks. If you do not have a doctor who manages your asthma, make an appointment with a primary care doctor to discuss your asthma. °SEEK IMMEDIATE MEDICAL CARE IF:  °· You are getting worse. °· You have trouble breathing. If severe, call your local   emergency services (911 in the [place] °· You develop chest pain or discomfort. °· You are vomiting. °· You are not able to keep fluids down. °· You are coughing up yellow, green, brown, or bloody sputum. °· You have a fever and your symptoms suddenly get worse. °· You have  trouble swallowing. °MAKE SURE YOU:  °· Understand these instructions. °· Will watch your condition. °· Will get help right away if you are not doing well or get worse. °  °This information is not intended to replace advice given to you by your health care provider. Make sure you discuss any questions you have with your health care provider. °  °Document Released: 07/31/2006 Document Revised: 04/20/2013 Document Reviewed: 10/21/2012 °[employer] Interactive Patient Education ©2016 [employer] ° °

## 2015-05-24 NOTE — Discharge Summary (Signed)
Name: April [name] MRN: [id number] DOB: 02-28-1971 45 y.o. PCP: [name], MD  Date of Admission: 05/23/2015  9:29 AM Date of Discharge: 05/24/2015 Attending Physician: Annia Belt, MD  Discharge Diagnosis: 1. Acute exacerbation of severe persistent asthma   Discharge Medications:   Medication List    STOP taking these medications        azithromycin 250 MG tablet  Commonly known as:  ZITHROMAX     guaiFENesin 600 MG 12 hr tablet  Commonly known as:  MUCINEX     zolpidem 10 MG tablet  Commonly known as:  AMBIEN      TAKE these medications        acetaminophen 325 MG tablet  Commonly known as:  TYLENOL  Take 2 tablets (650 mg total) by mouth every 6 (six) hours as needed for mild pain, moderate pain or headache.     albuterol (2.5 MG/3ML) 0.083% nebulizer solution  Commonly known as:  PROVENTIL  Take 3 mLs (2.5 mg total) by nebulization every 6 (six) hours as needed for wheezing or shortness of breath.     albuterol 108 (90 Base) MCG/ACT inhaler  Commonly known as:  PROVENTIL HFA;VENTOLIN HFA  Inhale 1-2 puffs into the lungs every 4 (four) hours as needed for wheezing or shortness of breath. Patient needs appointment before new Rx can be given     eszopiclone 2 MG Tabs tablet  Commonly known as:  LUNESTA  Take 1 tablet (2 mg total) by mouth at bedtime as needed for sleep. Take immediately before bedtime     fexofenadine 60 MG tablet  Commonly known as:  ALLEGRA  Take 1 tablet (60 mg total) by mouth 2 (two) times daily.     ipratropium 0.02 % nebulizer solution  Commonly known as:  ATROVENT  Take 2.5 mLs (0.5 mg total) by nebulization 4 (four) times daily as needed for wheezing or shortness of breath.     lisinopril-hydrochlorothiazide 10-12.5 MG tablet  Commonly known as:  PRINZIDE,ZESTORETIC  Take 1 tablet by mouth daily.     mometasone-formoterol 200-5 MCG/ACT Aero  Commonly known as:  DULERA  Inhale 2 puffs into the lungs 2 (two) times daily.  Rinse mouth after inhalation     nicotine 14 mg/24hr patch  Commonly known as:  NICODERM CQ - dosed in mg/24 hours  Place 1 patch (14 mg total) onto the skin daily.     omalizumab 150 MG injection  Commonly known as:  XOLAIR  Inject 150 mg into the skin every 14 (fourteen) days.     predniSONE 10 MG tablet  Commonly known as:  DELTASONE  Take 60mg  daily for 3 days, then 40mg  for 3 days, then 30mg  for 3 days, then 20mg  thereafter     ranitidine 150 MG tablet  Commonly known as:  ZANTAC  Take 2 tablets (300 mg total) by mouth at bedtime.     sertraline 50 MG tablet  Commonly known as:  ZOLOFT  Take 1 tablet (50 mg total) by mouth daily.       Disposition and follow-up:   April [name] was discharged from [facility] in Good condition.  At the hospital follow up visit please address:  Consider lengthening her steroid taper if needed  She will follow up with her pulmonologist in early February  Ask if she will be able to get back on her omalizumab injections  Follow-up Appointments: Dr. Annamaria Boots February 8th; they will call you Our  clinic will call you for a closer follow-up appointment  Procedures Performed:  Dg Chest 2 View  04/24/2015  CLINICAL DATA:  Acute wheezing, shortness of breath and cough for 4 days EXAM: CHEST  2 VIEW COMPARISON:  03/13/2015 FINDINGS: Mild central bronchitic change. No focal pneumonia, collapse or consolidation. No edema, effusion or pneumothorax. Trachea midline. Normal heart size and vascularity. No acute osseous finding. IMPRESSION: Mild central bronchitic change.  No focal pneumonia. Electronically Signed   By: Jerilynn Mages.  Shick M.D.   On: 04/24/2015 14:00   Dg Chest Portable 1 View  05/23/2015  CLINICAL DATA:  Cough and wheezing. EXAM: PORTABLE CHEST 1 VIEW COMPARISON:  04/24/2015 . FINDINGS: Mediastinum and hilar structures normal. The lungs are clear. Heart size normal. No pleural effusion or pneumothorax. No acute bony abnormality .  IMPRESSION: No acute cardiopulmonary disease. Electronically Signed   By: Marcello Moores  Register   On: 05/23/2015 10:28    Admission HPI:   April [name] is a 45 year old African-American lady with history of severe persistent asthma, obstructive sleep apnea, morbid obesity, gastroesophageal reflux disease, major depressive disorder, presenting with wheezing and shortness of breath for the last week.  She is followed by pulmonologist Dr. Annamaria Boots, and has been taking 10-20 mg prednisone daily for the last few years. She is also on LABA/ICS daily, albuterol MDI as needed, and inhaled ipratropium as needed. She had been getting omalizumab injections, but stopped this this when her because they were too expensive. She is been hospitalized about 5 times per year since she was a child, but has never been intubated.   Last month, she moved into a new apartment where she tells me she smells mold; she believes this has been her major trigger since moving. In the last week, her wheezing and shortness of breath has gotten significantly worse. She started taking prednisone 40 mg daily, using her albuterol 10 times per day, and her inhaled ipratropium 3 times daily. However, her wheezing has persisted until it got much worse this morning.  She used to smoke but quit one year ago. She does have significant gastroesophageal reflux disease. One of her neighbors has a pet. She also has hypertension, but does not have any other medical problems.  She denies any fevers, productive cough, chest pain, leg swelling, history of DVT or blood clots in the family. Review of systems was otherwise nonrevealing.  Hospital Course by problem list:   1. Exacerbation of severe persistent asthma: She presented with a one-week history of worsening dyspnea and wheezing. One month prior, she had moved into a new apartment that smells moldy, which she believes is her trigger. Also, she had not been getting omalizumab injections since November which  she felt were helping her tremendously. She says she had help paying for these; she simply hasn't followed up. She has about 5 hospitalizations per year, but has never been intubated. While in the hospital, we gave her scheduled albuterol inhalers atop her home regimen of ipratropium and Dulera inhalers, and increased her daily prednisone dose to 60 mg with a 12 day taper of 60mg x3 days, 40mg x3days, 30mg x3 days, then back to her home dose of 20 mg daily. She was discharged with close follow-up with her pulmonologist on February 8th and we will get her closer follow-up in our clinic to ensure she is doing well on the prednisone taper.  Discharge Vitals:   BP 150/81 mmHg  Pulse 73  Temp(Src) 98 F (36.7 C) (Oral)  Resp 18  Ht 5'  5" (1.651 m)  Wt 138.347 kg (305 lb)  BMI 50.75 kg/m2  SpO2 97%  LMP 04/30/2015  Discharge Labs:  Results for orders placed or performed during the hospital encounter of 05/23/15 (from the past 24 hour(s))  Basic metabolic panel     Status: Abnormal   Collection Time: 05/23/15 12:41 PM  Result Value Ref Range   Sodium 142 135 - 145 mmol/L   Potassium 4.1 3.5 - 5.1 mmol/L   Chloride 103 101 - 111 mmol/L   CO2 24 22 - 32 mmol/L   Glucose, Bld 113 (H) 65 - 99 mg/dL   BUN 14 6 - 20 mg/dL   Creatinine, Ser 0.77 0.44 - 1.00 mg/dL   Calcium 9.2 8.9 - 10.3 mg/dL   GFR calc non Af Amer >60 >60 mL/min   GFR calc Af Amer >60 >60 mL/min   Anion gap 15 5 - 15  CBC with Differential     Status: Abnormal   Collection Time: 05/23/15 12:41 PM  Result Value Ref Range   WBC 19.6 (H) 4.0 - 10.5 K/uL   RBC 4.72 3.87 - 5.11 MIL/uL   Hemoglobin 13.5 12.0 - 15.0 g/dL   HCT 41.5 36.0 - 46.0 %   MCV 87.9 78.0 - 100.0 fL   MCH 28.6 26.0 - 34.0 pg   MCHC 32.5 30.0 - 36.0 g/dL   RDW 16.9 (H) 11.5 - 15.5 %   Platelets 358 150 - 400 K/uL   Neutrophils Relative % 88 %   Neutro Abs 17.3 (H) 1.7 - 7.7 K/uL   Lymphocytes Relative 7 %   Lymphs Abs 1.3 0.7 - 4.0 K/uL   Monocytes  Relative 5 %   Monocytes Absolute 1.0 0.1 - 1.0 K/uL   Eosinophils Relative 0 %   Eosinophils Absolute 0.0 0.0 - 0.7 K/uL   Basophils Relative 0 %   Basophils Absolute 0.0 0.0 - 0.1 K/uL  POC Urine Pregnancy, ED (do NOT order at [facility])     Status: None   Collection Time: 05/23/15  1:15 PM  Result Value Ref Range   Preg Test, Ur NEGATIVE NEGATIVE   *Note: Due to a large number of results and/or encounters for the requested time period, some results have not been displayed. A complete set of results can be found in Results Review.    Signed: [name], MD 05/24/2015, 11:26 AM

## 2015-05-24 NOTE — Progress Notes (Signed)
SATURATION QUALIFICATIONS: (This note is used to comply with regulatory documentation for home oxygen)  Patient Saturations on Room Air at Rest = 99%  Patient Saturations on Room Air while Ambulating =97%  Patient Saturations on 0 Liters of oxygen while Ambulating = 97%  Please briefly explain why patient needs home oxygen: patient is not depended of oxygen at home and she doesn't need.

## 2015-05-24 NOTE — Progress Notes (Signed)
Patient was discharged home by MD order; discharged instructions review and give to patient with care notes and a note for work; IV DIC; skin intact; patient will be escorted to the car by nurse tech via wheelchair.

## 2015-05-24 NOTE — Evaluation (Signed)
Physical Therapy Evaluation Patient Details Name: April [name] MRN: [id number] DOB: April 07, 1971 Today's Date: 05/24/2015   History of Present Illness  Patient is a 45 y/o female with hx of HTN, COPD, depression, asthma, sleep apnea, Acanthosis nigricans presents with one week history of worsening cough, shortness of breath, and intermittent dizziness and lightheadedness. Found to have mold in her house. Admitted with asthma exacerbation.   Clinical Impression  Patient presents with decreased endurance, dyspnea on exertion, and symptoms consistent with BPPV. Performed Epley maneuver x2. Pt steady on feet but requires rest breaks due to fatigue. Education re: BPPV, vestibular system, inner ear dysfunction, follow up therapy and energy conservation techniques. Will follow acutely to maximize independence and mobility prior to return home.    Follow Up Recommendations Outpatient PT;Supervision - Intermittent (vestibular rehab)    Equipment Recommendations  None recommended by PT    Recommendations for Other Services       Precautions / Restrictions Restrictions Weight Bearing Restrictions: No      Mobility  Bed Mobility Overal bed mobility: Modified Independent                Transfers Overall transfer level: Modified independent Equipment used: None             General transfer comment: Stood from EOB x1, no assist needed.  Ambulation/Gait Ambulation/Gait assistance: Modified independent (Device/Increase time) Ambulation Distance (Feet): 200 Feet Assistive device: None Gait Pattern/deviations: Step-through pattern;Decreased stride length;Wide base of support   Gait velocity interpretation: <1.8 ft/sec, indicative of risk for recurrent falls General Gait Details: Steady gait. 2/4 DOE. VSS. Education for energy conservation.  Stairs            Wheelchair Mobility    Modified Rankin (Stroke Patients Only)       Balance Overall balance assessment: Needs  assistance Sitting-balance support: Feet supported;No upper extremity supported Sitting balance-Leahy Scale: Normal     Standing balance support: During functional activity Standing balance-Leahy Scale: Good                               Pertinent Vitals/Pain Pain Assessment: Faces Faces Pain Scale: Hurts a little bit Pain Location: back - chronic Pain Descriptors / Indicators: Sore Pain Intervention(s): Monitored during session;Repositioned    Home Living Family/patient expects to be discharged to:: Private residence Living Arrangements: Alone Available Help at Discharge: Available PRN/intermittently Type of Home: House Home Access: Level entry     Home Layout: One level Home Equipment: None      Prior Function Level of Independence: Independent               Hand Dominance        Extremity/Trunk Assessment   Upper Extremity Assessment: Defer to OT evaluation;Overall WFL for tasks assessed           Lower Extremity Assessment: Overall WFL for tasks assessed         Communication   Communication: No difficulties  Cognition Arousal/Alertness: Awake/alert Behavior During Therapy: WFL for tasks assessed/performed Overall Cognitive Status: Within Functional Limits for tasks assessed                      General Comments General comments (skin integrity, edema, etc.): + Dix Hallpike to the right and left with torsional nystagmus. Epley manuever performed in both directions. Education re: vestibular system, inner ear anatomy and BPPV. Need for outpatient vestibular rehab.  Exercises        Assessment/Plan    PT Assessment Patient needs continued PT services  PT Diagnosis Difficulty walking   PT Problem List Decreased activity tolerance;Decreased mobility;Decreased balance;Cardiopulmonary status limiting activity  PT Treatment Interventions Balance training;Functional mobility training;Therapeutic exercise;Therapeutic  activities;Patient/family education;Gait training   PT Goals (Current goals can be found in the Care Plan section) Acute Rehab PT Goals Patient Stated Goal: to go home today PT Goal Formulation: With patient Time For Goal Achievement: 06/07/15 Potential to Achieve Goals: Good    Frequency Min 3X/week   Barriers to discharge Decreased caregiver support lives alone    Co-evaluation               End of Session   Activity Tolerance: Patient tolerated treatment well Patient left: in bed;with call bell/phone within reach Nurse Communication: Mobility status;Other (comment) (Need for vestibular rehab)    Functional Assessment Tool Used: clinical judgment Functional Limitation: Mobility: Walking and moving around Mobility: Walking and Moving Around Current Status [phone]): At least 1 percent but less than 20 percent impaired, limited or restricted Mobility: Walking and Moving Around Goal Status [phone]): At least 1 percent but less than 20 percent impaired, limited or restricted    Time: 1357-1418 PT Time Calculation (min) (ACUTE ONLY): 21 min   Charges:   PT Evaluation $PT Eval Moderate Complexity: 1 Procedure     PT G Codes:   PT G-Codes **NOT FOR INPATIENT CLASS** Functional Assessment Tool Used: clinical judgment Functional Limitation: Mobility: Walking and moving around Mobility: Walking and Moving Around Current Status [id number]): At least 1 percent but less than 20 percent impaired, limited or restricted Mobility: Walking and Moving Around Goal Status [phone]): At least 1 percent but less than 20 percent impaired, limited or restricted    [place] 05/24/2015, 2:54 PM  Wray Kearns, [place], DPT [phone]

## 2015-05-24 NOTE — Progress Notes (Signed)
Subjective: April [name] reports feeling slightly better today, though notes that she feels her chest is still tight. She says that someone is coming to her house today to address the mold.   Objective: Vital signs in last 24 hours: Filed Vitals:   05/24/15 0427 05/24/15 0529 05/24/15 0620 05/24/15 0755  BP:   150/81   Pulse: 88 73 73   Temp:  98 F (36.7 C) 98 F (36.7 C)   TempSrc:  Oral Oral   Resp: 20 18 18    Height:      Weight:      SpO2:    98%   Exam: General: Obese African-American female lying comfortably in bed, speaking easily in full sentences  HEENT: EOMI, oropharynx clear, moist mucous membranes Cardio: Regular rate and rhythm, no murmurs, rubs or gallops.  Pulm/Chest: Diffuse end-expiratory wheezing bilaterally, slight improvement from yesterday. No crackles. No increased work of breathing.  Abd: Soft, nontender, nondistended.  MSK: No lower extremity edema. Skin: Warm, dry.  Neuro: Alert and oriented x 3. Grossly normal sensation and movement.  Psych: Appropriate behavior and affect.   Lab Results: No new labs today.   Medications: Scheduled Meds: . albuterol  2.5 mg Nebulization Q4H  . budesonide (PULMICORT) nebulizer solution  0.25 mg Nebulization BID  . enoxaparin (LOVENOX) injection  70 mg Subcutaneous Q24H  . hydrochlorothiazide  25 mg Oral Daily  . lisinopril  10 mg Oral Daily  . loratadine  10 mg Oral Daily  . nicotine  14 mg Transdermal Daily  . pantoprazole  40 mg Oral Daily  . predniSONE  60 mg Oral Q breakfast  . sertraline  50 mg Oral Daily   PRN Meds:.acetaminophen, albuterol, ondansetron **OR** ondansetron (ZOFRAN) IV, zolpidem   Assessment/Plan: April [name] is a 45 year-old woman with history of morbid obesity, asthma, COPD, obstructive sleep apnea, hypertension and gastroesophageal reflux disease who presents with a one week history of worsening cough, shortness of breath, and intermittent dizziness and lightheadedness.    1. Acute exacerbation of severe persistent asthma: Wheezing improved on exam today and she did not require supplemental oxygen overnight. Peak flows pre- and post-treatment were 200 and 250, respectively. She reports that someone is coming to her house today to fix a water leak causing the mold and hopes that this will be resolved before she goes back home. She agrees to following up with Dr. Annamaria Boots and says she will work on getting her Xolair injections more regularly, as they had been helping her. We will continue the same treatment regimen today and hope that she will be ready for discharge by tomorrow.  - Prednisone 60 mg daily x 5 days (stop 1/30), plan to taper back down 10-20mg  daily  - Albuterol 2.5 mg nebulization every 4 hours  - Budesonide 0.25 mg nebulization twice daily  - Continue mometasone-formoterol inhaler twice daily - Will need follow up appointment with Dr. Annamaria Boots   2. Vertigo and lightheadedness: She does not complain of these symptoms today. Orthostatic blood pressures yesterday demonstrated appropriate response to positional changes. Her lightheadedness may simply be due to transient hypoxia with cough, which would explain improvement as her breathing has improved. If she continues to complain of vertigo, we could try Dix-Halpike +/- Epley maneuvers and/or meclizine.  - Continue to monitor   2. Obstructive sleep apnea: This is likely worsening the dyspnea associated with her asthma and playing a role in her reported nighttime awakenings. She has a CPAP machine, but reports  poor compliance with this.  - Recommend increased use of CPAP  3. Hypertension: Pressures continued to be elevated overnight up to 204/96.  - Continue home lisinopril 10 mg daily  - Increase hydrochlorothiazide from 12.5 mg to 25 mg daily   4. GERD: Longstanding reflux likely exacerbating her cough.  - Continue home ranitidine 300 mg at bedtime - Continue pantoprazole 40 mg daily   5.  Leukocytosis: Likely due to chronic corticosteroid use. - Consider further workup if she becomes febrile or clinically worsens  5. Depression: Normal affect on exam today.  - Continue home zoloft 50 mg daily  - Continue home ambien 10 mg nightly   6. Tobacco abuse:  - Nicotine 14 mg patch daily   DVT PPx: Lovenox 40 mg SQ daily   FEN: Fluids: none Electrolytes: wnl Nutrition: regular diet   Code Status: Full   Dispo: Pending resolution of current symptoms. Anticipate discharge in 2-3 days.    This is a Careers information officer Note.  The care of the patient was discussed with Dr. Melburn Hake and the assessment and plan formulated with their assistance.  Please see their attached note for official documentation of the daily encounter.       Everlene Balls, [place] [facility]  05/24/2015, 8:13 AM

## 2015-05-25 ENCOUNTER — Encounter (HOSPITAL_COMMUNITY): Payer: Self-pay | Admitting: Emergency Medicine

## 2015-05-25 ENCOUNTER — Telehealth: Payer: Self-pay | Admitting: Internal Medicine

## 2015-05-25 ENCOUNTER — Emergency Department (HOSPITAL_COMMUNITY): Payer: Self-pay

## 2015-05-25 ENCOUNTER — Emergency Department (HOSPITAL_COMMUNITY)
Admission: EM | Admit: 2015-05-25 | Discharge: 2015-05-25 | Disposition: A | Discharge status: Home / Self Care | Payer: Self-pay | Attending: Emergency Medicine | Admitting: Emergency Medicine

## 2015-05-25 ENCOUNTER — Ambulatory Visit (INDEPENDENT_AMBULATORY_CARE_PROVIDER_SITE_OTHER): Payer: Self-pay

## 2015-05-25 DIAGNOSIS — J069 Acute upper respiratory infection, unspecified: Secondary | ICD-10-CM | POA: Insufficient documentation

## 2015-05-25 DIAGNOSIS — G4730 Sleep apnea, unspecified: Secondary | ICD-10-CM | POA: Insufficient documentation

## 2015-05-25 DIAGNOSIS — K219 Gastro-esophageal reflux disease without esophagitis: Secondary | ICD-10-CM | POA: Insufficient documentation

## 2015-05-25 DIAGNOSIS — I10 Essential (primary) hypertension: Secondary | ICD-10-CM | POA: Insufficient documentation

## 2015-05-25 DIAGNOSIS — Z87891 Personal history of nicotine dependence: Secondary | ICD-10-CM | POA: Insufficient documentation

## 2015-05-25 DIAGNOSIS — J441 Chronic obstructive pulmonary disease with (acute) exacerbation: Secondary | ICD-10-CM | POA: Insufficient documentation

## 2015-05-25 DIAGNOSIS — Z8619 Personal history of other infectious and parasitic diseases: Secondary | ICD-10-CM | POA: Insufficient documentation

## 2015-05-25 DIAGNOSIS — Z8742 Personal history of other diseases of the female genital tract: Secondary | ICD-10-CM | POA: Insufficient documentation

## 2015-05-25 DIAGNOSIS — J4540 Moderate persistent asthma, uncomplicated: Secondary | ICD-10-CM

## 2015-05-25 DIAGNOSIS — M1990 Unspecified osteoarthritis, unspecified site: Secondary | ICD-10-CM | POA: Insufficient documentation

## 2015-05-25 DIAGNOSIS — F329 Major depressive disorder, single episode, unspecified: Secondary | ICD-10-CM | POA: Insufficient documentation

## 2015-05-25 DIAGNOSIS — Z872 Personal history of diseases of the skin and subcutaneous tissue: Secondary | ICD-10-CM | POA: Insufficient documentation

## 2015-05-25 DIAGNOSIS — G4700 Insomnia, unspecified: Secondary | ICD-10-CM | POA: Insufficient documentation

## 2015-05-25 DIAGNOSIS — J45901 Unspecified asthma with (acute) exacerbation: Secondary | ICD-10-CM

## 2015-05-25 DIAGNOSIS — Z7951 Long term (current) use of inhaled steroids: Secondary | ICD-10-CM | POA: Insufficient documentation

## 2015-05-25 DIAGNOSIS — Z79899 Other long term (current) drug therapy: Secondary | ICD-10-CM | POA: Insufficient documentation

## 2015-05-25 MED ORDER — ALBUTEROL (5 MG/ML) CONTINUOUS INHALATION SOLN
10.0000 mg/h | INHALATION_SOLUTION | Freq: Once | RESPIRATORY_TRACT | Status: AC
  Administered 2015-05-25: 10 mg/h via RESPIRATORY_TRACT
  Filled 2015-05-25: qty 20

## 2015-05-25 MED ORDER — ACETAMINOPHEN 325 MG PO TABS
650.0000 mg | ORAL_TABLET | Freq: Once | ORAL | Status: AC
  Administered 2015-05-25: 650 mg via ORAL
  Filled 2015-05-25: qty 2

## 2015-05-25 MED ORDER — AZITHROMYCIN 250 MG PO TABS: 250.0000 mg | ORAL_TABLET | Freq: Every day | ORAL | Status: DC

## 2015-05-25 MED ORDER — PREDNISONE 20 MG PO TABS
60.0000 mg | ORAL_TABLET | Freq: Once | ORAL | Status: AC
  Administered 2015-05-25: 60 mg via ORAL
  Filled 2015-05-25: qty 3

## 2015-05-25 MED ORDER — IPRATROPIUM-ALBUTEROL 0.5-2.5 (3) MG/3ML IN SOLN
3.0000 mL | Freq: Once | RESPIRATORY_TRACT | Status: AC
  Administered 2015-05-25: 3 mL via RESPIRATORY_TRACT
  Filled 2015-05-25: qty 3

## 2015-05-25 MED ORDER — OMALIZUMAB 150 MG ~~LOC~~ SOLR
225.0000 mg | Freq: Once | SUBCUTANEOUS | Status: AC
  Administered 2015-05-25: 225 mg via SUBCUTANEOUS

## 2015-05-25 MED ORDER — ALBUTEROL SULFATE HFA 108 (90 BASE) MCG/ACT IN AERS: 1.0000 | INHALATION_SPRAY | RESPIRATORY_TRACT | Status: DC

## 2015-05-25 NOTE — Telephone Encounter (Signed)
Spoke with pt. States that she was discharged from the hospital yesterday. She is scheduled for her Xolair injection today at 11am. Denies having a cough or fever currently.  CY - please advise if pt is okay for her Xolair injection. Thanks.

## 2015-05-25 NOTE — ED Provider Notes (Signed)
CSN: [id number]     Arrival date & time 05/25/15  1134 History   First MD Initiated Contact with Patient 05/25/15 1317     Chief Complaint  Patient presents with  . Asthma   HPI   45 year old female presents today with asthma exacerbation.  Patient was seen and evaluated in the ED, admitted to the hospital on January 21 (5 days ago ). Patient reports of some discharge she is still was not feeling better, was in no respiratory distress. She reports using albuterol at home with no significant improvements in her congestion, but she denies any acute worsening. She reports that over the last several weeks she's had an upper respiratory infection with cough, rhinorrhea, congestion. Patient reports when she was discharged from the hospital she was given prednisone, but has not filled her medication, and has not taken this since discharge. She denies any baseline shortness of breath, but reports ambulation does cause more wheezing and shortness of breath. She denies any lower extremity swelling or edema, history of DVT or PE or any other significant risk factors for pulmonary embolism.    Past Medical History  Diagnosis Date  . Acanthosis nigricans   . Menorrhagia   . Hypertension, essential   . Insomnia   . Morbid obesity ([place])   . Helicobacter pylori (H. pylori) infection   . Sleep apnea     Sleep study 2008 - mild OSA, not enough events to titrate CPAP  . COPD (chronic obstructive pulmonary disease) ([place])     PFTs in 2002, FEV1/FVC 65, no post bronchodilater test done  . Asthma     Followed by Dr. Annamaria Boots (pulmonology); receives every other week omalizumab injections; has frequent exacerbations  . Depression   . GERD (gastroesophageal reflux disease)   . Tobacco user   . Obesity   . Shortness of breath   . Headache(784.0)   . Seasonal allergies   . Arthritis    Past Surgical History  Procedure Laterality Date  . Tubal ligation  1996    bilateral  . Breast reduction surgery  09/2011    Family History  Problem Relation Age of Onset  . Asthma Daughter   . Cancer Paternal Aunt   . Asthma Maternal Grandmother   . Hypertension Mother    Social History  Substance Use Topics  . Smoking status: Former Smoker -- 18 years    Types: Cigarettes    Quit date: 09/15/2014  . Smokeless tobacco: Former Systems developer    Quit date: 09/12/2014  . Alcohol Use: No   OB History    No data available     Review of Systems  All other systems reviewed and are negative.  Allergies  Review of patient's allergies indicates no known allergies.  Home Medications   Prior to Admission medications   Medication Sig Start Date End Date Taking? Authorizing Provider  albuterol (PROVENTIL) (2.5 MG/3ML) 0.083% nebulizer solution Take 3 mLs (2.5 mg total) by nebulization every 6 (six) hours as needed for wheezing or shortness of breath. 11/18/14  Yes Arnoldo Morale, MD  eszopiclone (LUNESTA) 2 MG TABS tablet Take 1 tablet (2 mg total) by mouth at bedtime as needed for sleep. Take immediately before bedtime 03/20/15  Yes Deneise Lever, MD  ipratropium (ATROVENT) 0.02 % nebulizer solution Take 2.5 mLs (0.5 mg total) by nebulization 4 (four) times daily as needed for wheezing or shortness of breath. 11/18/14  Yes Arnoldo Morale, MD  lisinopril-hydrochlorothiazide (PRINZIDE,ZESTORETIC) 10-12.5 MG per tablet Take 1  tablet by mouth daily. 11/18/14  Yes Arnoldo Morale, MD  mometasone-formoterol (DULERA) 200-5 MCG/ACT AERO Inhale 2 puffs into the lungs 2 (two) times daily. Rinse mouth after inhalation 04/04/15  Yes Deneise Lever, MD  nicotine (NICODERM CQ - DOSED IN MG/24 HOURS) 14 mg/24hr patch Place 1 patch (14 mg total) onto the skin daily. 03/14/15  Yes Smiley Houseman, MD  omalizumab Arvid Right) 150 MG injection Inject 150 mg into the skin every 14 (fourteen) days.   Yes Historical Provider, MD  predniSONE (DELTASONE) 10 MG tablet Take 60mg  daily for 3 days, then 40mg  for 3 days, then 30mg  for 3 days, then 20mg   thereafter 05/24/15  Yes [name], MD  ranitidine (ZANTAC) 150 MG tablet Take 2 tablets (300 mg total) by mouth at bedtime. 11/03/14  Yes [name], MD  sertraline (ZOLOFT) 50 MG tablet Take 1 tablet (50 mg total) by mouth daily. 11/18/14  Yes Arnoldo Morale, MD  zolpidem (AMBIEN) 10 MG tablet Take 10 mg by mouth at bedtime as needed for sleep.   Yes Historical Provider, MD  acetaminophen (TYLENOL) 325 MG tablet Take 2 tablets (650 mg total) by mouth every 6 (six) hours as needed for mild pain, moderate pain or headache. Patient not taking: Reported on 05/25/2015 05/24/15   [name], MD  albuterol (PROVENTIL HFA;VENTOLIN HFA) 108 (90 Base) MCG/ACT inhaler Inhale 1-2 puffs into the lungs every 4 (four) hours. 05/25/15   Okey Regal, PA-C  azithromycin (ZITHROMAX) 250 MG tablet Take 1 tablet (250 mg total) by mouth daily. Take first 2 tablets together, then 1 every day until finished. 05/25/15   Okey Regal, PA-C  fexofenadine (ALLEGRA) 60 MG tablet Take 1 tablet (60 mg total) by mouth 2 (two) times daily. Patient not taking: Reported on 05/23/2015 02/08/15   Tatyana Kirichenko, PA-C   BP 170/96 mmHg  Pulse 103  Temp(Src) 98.3 F (36.8 C) (Oral)  Resp 18  SpO2 100%  LMP 04/30/2015   Physical Exam  Constitutional: She is oriented to person, place, and time. She appears well-developed and well-nourished.  HENT:  Head: Normocephalic and atraumatic.  Eyes: Conjunctivae are normal. Pupils are equal, round, and reactive to light. Right eye exhibits no discharge. Left eye exhibits no discharge. No scleral icterus.  Neck: Normal range of motion. No JVD present. No tracheal deviation present.  Cardiovascular: Normal rate, regular rhythm and intact distal pulses.  Exam reveals no gallop and no friction rub.   No murmur heard. Pulmonary/Chest: Effort normal. No stridor. No respiratory distress. She has wheezes. She has no rales. She exhibits no tenderness.  Abdominal: Soft. There is no  tenderness.  Musculoskeletal: Normal range of motion. She exhibits no edema or tenderness.  Neurological: She is alert and oriented to person, place, and time. Coordination normal.  Skin: Skin is warm and dry. No rash noted. No erythema. No pallor.  Psychiatric: She has a normal mood and affect. Her behavior is normal. Judgment and thought content normal.  Nursing note and vitals reviewed.   ED Course  Procedures (including critical care time) Labs Review Labs Reviewed - No data to display  Imaging Review Dg Chest 2 View  05/25/2015  CLINICAL DATA:  Shortness of breath and cough for 1 day EXAM: CHEST  2 VIEW COMPARISON:  May 23, 2015 FINDINGS: There is no edema or consolidation. The heart size and pulmonary vascularity are normal. No adenopathy. No bone lesions. IMPRESSION: No edema or consolidation. Electronically Signed   By: Lowella Grip  III M.D.   On: 05/25/2015 12:27   I have personally reviewed and evaluated these images and lab results as part of my medical decision-making.   EKG Interpretation None      MDM   Final diagnoses:  Asthma exacerbation  URI (upper respiratory infection)   Labs:  Imaging:DG chest 2 view - no edema or consolidation   Consults:  Therapeutics: DuoNeb, albuterol   Discharge Meds:   Assessment/Plan:Patient presents with asthma and likely URI. Patient's oxygen here remains in the upper 90s with 100% on at the time of discharge, she has no respiratory distress, no increased work of breathing respirations 18. Patient is afebrile, nontoxic in no acute distress. Patient is not taking her prednisone as directed by discharge instructions. Patient was given a breathing treatment here which improved her wheezing, she will be discharged home with instructions to continue taking her prednisone, albuterol as needed, follow up with primary care tomorrow for reevaluation. She is given strict return precautions, verbalizes understanding and agreement  for today's plan and had no further questions or concerns at the time of discharge.         Okey Regal, PA-C 05/25/15 [place], MD 05/26/15 [phone]

## 2015-05-25 NOTE — ED Notes (Signed)
Per pt, states asthma symptoms since last night-states she was just discharged from the hospital on the 24th

## 2015-05-25 NOTE — Telephone Encounter (Signed)
Per CY-yes okay for patient to get Xolair today. Thanks.

## 2015-05-25 NOTE — Telephone Encounter (Signed)
Spoke with pt. She is aware that she can get her Xolair injection today. Nothing further was needed.

## 2015-05-25 NOTE — ED Notes (Signed)
Pt ambulated to bathroom 

## 2015-05-25 NOTE — Discharge Instructions (Signed)
Asthma, Adult Asthma is a recurring condition in which the airways tighten and narrow. Asthma can make it difficult to breathe. It can cause coughing, wheezing, and shortness of breath. Asthma episodes, also called asthma attacks, range from minor to life-threatening. Asthma cannot be cured, but medicines and lifestyle changes can help control it. CAUSES Asthma is believed to be caused by inherited (genetic) and environmental factors, but its exact cause is unknown. Asthma may be triggered by allergens, lung infections, or irritants in the air. Asthma triggers are different for each person. Common triggers include:   Animal dander.  Dust mites.  Cockroaches.  Pollen from trees or grass.  Mold.  Smoke.  Air pollutants such as dust, household cleaners, hair sprays, aerosol sprays, paint fumes, strong chemicals, or strong odors.  Cold air, weather changes, and winds (which increase molds and pollens in the air).  Strong emotional expressions such as crying or laughing hard.  Stress.  Certain medicines (such as aspirin) or types of drugs (such as beta-blockers).  Sulfites in foods and drinks. Foods and drinks that may contain sulfites include dried fruit, potato chips, and sparkling grape juice.  Infections or inflammatory conditions such as the flu, a cold, or an inflammation of the nasal membranes (rhinitis).  Gastroesophageal reflux disease (GERD).  Exercise or strenuous activity. SYMPTOMS Symptoms may occur immediately after asthma is triggered or many hours later. Symptoms include:  Wheezing.  Excessive nighttime or early morning coughing.  Frequent or severe coughing with a common cold.  Chest tightness.  Shortness of breath. DIAGNOSIS  The diagnosis of asthma is made by a review of your medical history and a physical exam. Tests may also be performed. These may include:  Lung function studies. These tests show how much air you breathe in and out.  Allergy  tests.  Imaging tests such as X-rays. TREATMENT  Asthma cannot be cured, but it can usually be controlled. Treatment involves identifying and avoiding your asthma triggers. It also involves medicines. There are 2 classes of medicine used for asthma treatment:   Controller medicines. These prevent asthma symptoms from occurring. They are usually taken every day.  Reliever or rescue medicines. These quickly relieve asthma symptoms. They are used as needed and provide short-term relief. Your health care provider will help you create an asthma action plan. An asthma action plan is a written plan for managing and treating your asthma attacks. It includes a list of your asthma triggers and how they may be avoided. It also includes information on when medicines should be taken and when their dosage should be changed. An action plan may also involve the use of a device called a peak flow meter. A peak flow meter measures how well the lungs are working. It helps you monitor your condition. HOME CARE INSTRUCTIONS   Take medicines only as directed by your health care provider. Speak with your health care provider if you have questions about how or when to take the medicines.  Use a peak flow meter as directed by your health care provider. Record and keep track of readings.  Understand and use the action plan to help minimize or stop an asthma attack without needing to seek medical care.  Control your home environment in the following ways to help prevent asthma attacks:  Do not smoke. Avoid being exposed to secondhand smoke.  Change your heating and air conditioning filter regularly.  Limit your use of fireplaces and wood stoves.  Get rid of pests (such as roaches  and mice) and their droppings.  Throw away plants if you see mold on them.  Clean your floors and dust regularly. Use unscented cleaning products.  Try to have someone else vacuum for you regularly. Stay out of rooms while they are  being vacuumed and for a short while afterward. If you vacuum, use a dust mask from a hardware store, a double-layered or microfilter vacuum cleaner bag, or a vacuum cleaner with a HEPA filter.  Replace carpet with wood, tile, or vinyl flooring. Carpet can trap dander and dust.  Use allergy-proof pillows, mattress covers, and box spring covers.  Wash bed sheets and blankets every week in hot water and dry them in a dryer.  Use blankets that are made of polyester or cotton.  Clean bathrooms and kitchens with bleach. If possible, have someone repaint the walls in these rooms with mold-resistant paint. Keep out of the rooms that are being cleaned and painted.  Wash hands frequently. SEEK MEDICAL CARE IF:   You have wheezing, shortness of breath, or a cough even if taking medicine to prevent attacks.  The colored mucus you cough up (sputum) is thicker than usual.  Your sputum changes from clear or white to yellow, green, gray, or bloody.  You have any problems that may be related to the medicines you are taking (such as a rash, itching, swelling, or trouble breathing).  You are using a reliever medicine more than 2-3 times per week.  Your peak flow is still at 50-79% of your personal best after following your action plan for 1 hour.  You have a fever. SEEK IMMEDIATE MEDICAL CARE IF:   You seem to be getting worse and are unresponsive to treatment during an asthma attack.  You are short of breath even at rest.  You get short of breath when doing very little physical activity.  You have difficulty eating, drinking, or talking due to asthma symptoms.  You develop chest pain.  You develop a fast heartbeat.  You have a bluish color to your lips or fingernails.  You are light-headed, dizzy, or faint.  Your peak flow is less than 50% of your personal best.   This information is not intended to replace advice given to you by your health care provider. Make sure you discuss any  questions you have with your health care provider.   Document Released: 04/15/2005 Document Revised: 01/04/2015 Document Reviewed: 11/12/2012 Elsevier Interactive Patient Education [employer].  Please follow-up with your primary care provider in 2 days for reevaluation, please take prednisone as directed. If any signs of respiratory compromise present please return immediately to the emergency room for further evaluation.

## 2015-05-26 ENCOUNTER — Other Ambulatory Visit: Payer: Self-pay | Admitting: Family Medicine

## 2015-05-30 ENCOUNTER — Telehealth: Payer: Self-pay | Admitting: Internal Medicine

## 2015-05-30 NOTE — Telephone Encounter (Signed)
Will forward to [employer] to schedule shipment and see what other info is needed.

## 2015-05-31 ENCOUNTER — Ambulatory Visit (INDEPENDENT_AMBULATORY_CARE_PROVIDER_SITE_OTHER): Payer: Self-pay | Admitting: Internal Medicine

## 2015-05-31 VITALS — BP 170/101 | HR 73 | Temp 98.8°F | Ht 65.0 in | Wt 330.2 lb

## 2015-05-31 DIAGNOSIS — Z7951 Long term (current) use of inhaled steroids: Secondary | ICD-10-CM

## 2015-05-31 DIAGNOSIS — J449 Chronic obstructive pulmonary disease, unspecified: Secondary | ICD-10-CM

## 2015-05-31 DIAGNOSIS — M546 Pain in thoracic spine: Secondary | ICD-10-CM

## 2015-05-31 DIAGNOSIS — Z6841 Body Mass Index (BMI) 40.0 and over, adult: Secondary | ICD-10-CM

## 2015-05-31 DIAGNOSIS — J4550 Severe persistent asthma, uncomplicated: Secondary | ICD-10-CM

## 2015-05-31 DIAGNOSIS — M545 Low back pain, unspecified: Secondary | ICD-10-CM

## 2015-05-31 DIAGNOSIS — G8929 Other chronic pain: Secondary | ICD-10-CM

## 2015-05-31 DIAGNOSIS — I10 Essential (primary) hypertension: Secondary | ICD-10-CM

## 2015-05-31 NOTE — Progress Notes (Signed)
[facility] Subjective:   Patient ID: April [name] female   DOB: April 18, 1971 45 y.o.   MRN: [id number]  HPI: [name] is a 45 y.o. female with a PMH detailed below who presents for hosptial follow up after recent admission for asthma exacerbation.  Please see problem based charting below for the status of her chronic medical problems.    Past Medical History  Diagnosis Date  . Acanthosis nigricans   . Menorrhagia   . Hypertension, essential   . Insomnia   . Morbid obesity ([place])   . Helicobacter pylori (H. pylori) infection   . Sleep apnea     Sleep study 2008 - mild OSA, not enough events to titrate CPAP  . COPD (chronic obstructive pulmonary disease) ([place])     PFTs in 2002, FEV1/FVC 65, no post bronchodilater test done  . Asthma     Followed by Dr. Annamaria [name] (pulmonology); receives every other week omalizumab injections; has frequent exacerbations  . Depression   . GERD (gastroesophageal reflux disease)   . Tobacco user   . Obesity   . Shortness of breath   . Headache(784.0)   . Seasonal allergies   . Arthritis    Current Outpatient Prescriptions  Medication Sig Dispense Refill  . acetaminophen (TYLENOL) 325 MG tablet Take 2 tablets (650 mg total) by mouth every 6 (six) hours as needed for mild pain, moderate pain or headache. (Patient not taking: Reported on 05/25/2015) 30 tablet 0  . albuterol (PROVENTIL HFA;VENTOLIN HFA) 108 (90 Base) MCG/ACT inhaler Inhale 1-2 puffs into the lungs every 4 (four) hours. 1 Inhaler 0  . albuterol (PROVENTIL) (2.5 MG/3ML) 0.083% nebulizer solution Take 3 mLs (2.5 mg total) by nebulization every 6 (six) hours as needed for wheezing or shortness of breath. 150 mL 1  . azithromycin (ZITHROMAX) 250 MG tablet Take 1 tablet (250 mg total) by mouth daily. Take first 2 tablets together, then 1 every day until finished. 6 tablet 0  . eszopiclone (LUNESTA) 2 MG TABS tablet Take 1 tablet (2 mg total) by mouth at bedtime as needed  for sleep. Take immediately before bedtime 30 tablet 5  . fexofenadine (ALLEGRA) 60 MG tablet Take 1 tablet (60 mg total) by mouth 2 (two) times daily. (Patient not taking: Reported on 05/23/2015) 30 tablet 0  . ipratropium (ATROVENT) 0.02 % nebulizer solution Take 2.5 mLs (0.5 mg total) by nebulization 4 (four) times daily as needed for wheezing or shortness of breath. 75 mL 2  . lisinopril-hydrochlorothiazide (PRINZIDE,ZESTORETIC) 10-12.5 MG per tablet Take 1 tablet by mouth daily. 30 tablet 2  . mometasone-formoterol (DULERA) 200-5 MCG/ACT AERO Inhale 2 puffs into the lungs 2 (two) times daily. Rinse mouth after inhalation 2 Inhaler 0  . nicotine (NICODERM CQ - DOSED IN MG/24 HOURS) 14 mg/24hr patch Place 1 patch (14 mg total) onto the skin daily. 28 patch 0  . omalizumab (XOLAIR) 150 MG injection Inject 150 mg into the skin every 14 (fourteen) days.    . predniSONE (DELTASONE) 10 MG tablet Take 60mg  daily for 3 days, then 40mg  for 3 days, then 30mg  for 3 days, then 20mg  thereafter 60 tablet 2  . ranitidine (ZANTAC) 150 MG tablet Take 2 tablets (300 mg total) by mouth at bedtime. 180 tablet 3  . sertraline (ZOLOFT) 50 MG tablet Take 1 tablet (50 mg total) by mouth daily. 30 tablet 1  . zolpidem (AMBIEN) 10 MG tablet Take 10 mg by mouth at bedtime as  needed for sleep.     No current facility-administered medications for this visit.   Family History  Problem Relation Age of Onset  . Asthma Daughter   . Cancer Paternal Aunt   . Asthma Maternal Grandmother   . Hypertension Mother    Social History   Social History  . Marital Status: Single    Spouse Name: N/A  . Number of Children: 1  . 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 Smoking status: Former Smoker -- 18 years    Types: Cigarettes    Quit date: 09/15/2014  . Smokeless tobacco: Former Systems developer    Quit date: 09/12/2014  . Alcohol Use: No  . Drug Use: No  . Sexual  Activity: Not on file   Other Topics Concern  . 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    Review of Systems: Review of Systems  Constitutional: Negative for fever and chills.  Eyes: Negative for blurred vision.  Respiratory: Positive for wheezing. Negative for cough, sputum production and shortness of breath.   Gastrointestinal: Negative for heartburn and abdominal pain.  Musculoskeletal: Positive for back pain.  Neurological: Negative for headaches.  All other systems reviewed and are negative.   Physical Exam  Constitutional: She is well-developed, well-nourished, and in no distress.  Morbidly obese  Cardiovascular: Normal rate and regular rhythm.   Pulmonary/Chest: Effort normal and breath sounds normal. She has no wheezes.  Abdominal: Soft. Bowel sounds are normal.  Musculoskeletal:       Thoracic back: She exhibits no tenderness and no bony tenderness.       Lumbar back: She exhibits no tenderness, no bony tenderness and no spasm.  Skin: No rash noted.  Nursing note and vitals reviewed.   Objective:  Physical Exam: Filed Vitals:   05/31/15 1623  BP: 170/101  Pulse: 73  Temp: 98.8 F (37.1 C)  TempSrc: Oral  Height: 5\' 5"  (1.651 m)  Weight: 330 lb 3.2 oz (149.778 kg)  SpO2: 100%    Assessment & Plan:  Case discussed with Dr. Dareen [name]  Asthma, chronic obstructive, without status asthmaticus ([place]) HPI: She was admitted for an asthma exacerbation on 1/24 and discharged with a steroid taper on 1/25.  She apparently presented back to the ED on 1/26 NOT in respiratory distress for reevaluation and was discharged home and instructed to resume her prednisone.  She reports today she is taking her steroids and overall feels better but is using her albuterol inhaler 4x per day.  She has follow up next week with her pulmonologist for Xolair  injections.  A:Severe persistant asthma  P: Continue steroid taper, follow up with Dr April [name].  Back pain of thoracolumbar region HPI: She is complaining today of chronic low back pain, mostly dull and achy but sometimes sharp.  It does not radiate.  It is not helped by any medication or cream (nor lidocaine patches).  She has only had relief with percocet for which she would like to have.  A: Chronic low back pain  P:  Told patient this is related to her weight and we need to address the wegiht.   - Will avoid NSAIDs given her asthma, I did offer voltaren gel as well as lidocaine but she reported it would be  a waste of money.   - Noted previous attempts at referral to PT and repeat imaging which patient declined, she declined this again today. - I am uncomfortable in prescribing narcotic medication to Ms [place], there is not good evidence for benefit and large potential for harm, she seemed satisfied with this explantation.  Morbid obesity -Stressed importance of weight loss be decreased caloric intake.  Essential hypertension HPI: Patient has medication at home but has not taken it.  A; Uncontrolled Essential Hypertension   P: Advised to take medications, reassess at follow up.    Medications Ordered No orders of the defined types were placed in this encounter.   Other Orders No orders of the defined types were placed in this encounter.   Follow Up: Return in about 4 weeks (around 06/28/2015).

## 2015-05-31 NOTE — Assessment & Plan Note (Signed)
HPI: She was admitted for an asthma exacerbation on 1/24 and discharged with a steroid taper on 1/25.  She apparently presented back to the ED on 1/26 NOT in respiratory distress for reevaluation and was discharged home and instructed to resume her prednisone.  She reports today she is taking her steroids and overall feels better but is using her albuterol inhaler 4x per day.  She has follow up next week with her pulmonologist for Xolair injections.  A:Severe persistant asthma  P: Continue steroid taper, follow up with Dr Annamaria Boots.

## 2015-05-31 NOTE — Assessment & Plan Note (Signed)
-  Stressed importance of weight loss be decreased caloric intake.

## 2015-05-31 NOTE — Assessment & Plan Note (Signed)
HPI: Patient has medication at home but has not taken it.  A; Uncontrolled Essential Hypertension   P: Advised to take medications, reassess at follow up.

## 2015-05-31 NOTE — Assessment & Plan Note (Signed)
HPI: She is complaining today of chronic low back pain, mostly dull and achy but sometimes sharp.  It does not radiate.  It is not helped by any medication or cream (nor lidocaine patches).  She has only had relief with percocet for which she would like to have.  A: Chronic low back pain  P:  Told patient this is related to her weight and we need to address the wegiht.   - Will avoid NSAIDs given her asthma, I did offer voltaren gel as well as lidocaine but she reported it would be a waste of money.   - Noted previous attempts at referral to PT and repeat imaging which patient declined, she declined this again today. - I am uncomfortable in prescribing narcotic medication to Ms [place], there is not good evidence for benefit and large potential for harm, she seemed satisfied with this explantation.

## 2015-06-01 NOTE — Telephone Encounter (Signed)
I faxed [facility] 05/29/15, it was an infusion form. I asked them to send her med. By 06/06/15. [facility] just sent a fax today confriming they were going to send xolair then.

## 2015-06-01 NOTE — Progress Notes (Signed)
[facility] Clinic Attending  Case discussed with Dr. Hoffman soon after the resident saw the patient.  We reviewed the resident's history and exam and pertinent patient test results.  I agree with the assessment, diagnosis, and plan of care documented in the resident's note. 

## 2015-06-01 NOTE — Telephone Encounter (Addendum)
#   vials:4 Ordered date:06/01/15 Shipping Date:06/06/15 Forgot to leave encounter open. Will addend when xolair comes in tomorrow.

## 2015-06-06 NOTE — Telephone Encounter (Signed)
#   Vials:4 Arrival Date:06/06/15 Lot [id number] Exp Date:6/20

## 2015-06-07 ENCOUNTER — Inpatient Hospital Stay: Payer: Self-pay | Admitting: Acute Care

## 2015-06-07 ENCOUNTER — Ambulatory Visit: Payer: Self-pay

## 2015-06-09 ENCOUNTER — Ambulatory Visit (INDEPENDENT_AMBULATORY_CARE_PROVIDER_SITE_OTHER): Payer: Self-pay

## 2015-06-09 ENCOUNTER — Telehealth: Payer: Self-pay | Admitting: Internal Medicine

## 2015-06-09 DIAGNOSIS — J4540 Moderate persistent asthma, uncomplicated: Secondary | ICD-10-CM

## 2015-06-09 NOTE — Telephone Encounter (Signed)
IN LOBBY, requesting samples

## 2015-06-09 NOTE — Telephone Encounter (Signed)
Spoke with pt, requesting dulera 200 and albuterol inhaler samples.   Checked in the back, no samples of either in stock at this time. Spoke with pt, aware of this.  Nothing further needed.

## 2015-06-12 MED ORDER — OMALIZUMAB 150 MG ~~LOC~~ SOLR
225.0000 mg | Freq: Once | SUBCUTANEOUS | Status: AC
  Administered 2015-06-09: 225 mg via SUBCUTANEOUS

## 2015-06-21 ENCOUNTER — Inpatient Hospital Stay: Payer: Self-pay | Admitting: Acute Care

## 2015-06-21 MED FILL — predniSONE 10 MG TABS: 10 | 15 days supply | Qty: 60 | Fill #1

## 2015-06-22 ENCOUNTER — Ambulatory Visit: Payer: Self-pay | Admitting: Family Medicine

## 2015-06-22 IMAGING — CR DG CHEST 2V
2 series · 2 of 2 positions shown · non-contrast
Comparison: 03/27/2014

CLINICAL DATA: Shortness of breath.  Asthma attack

EXAM:
CHEST  2 VIEW

[chest pa]
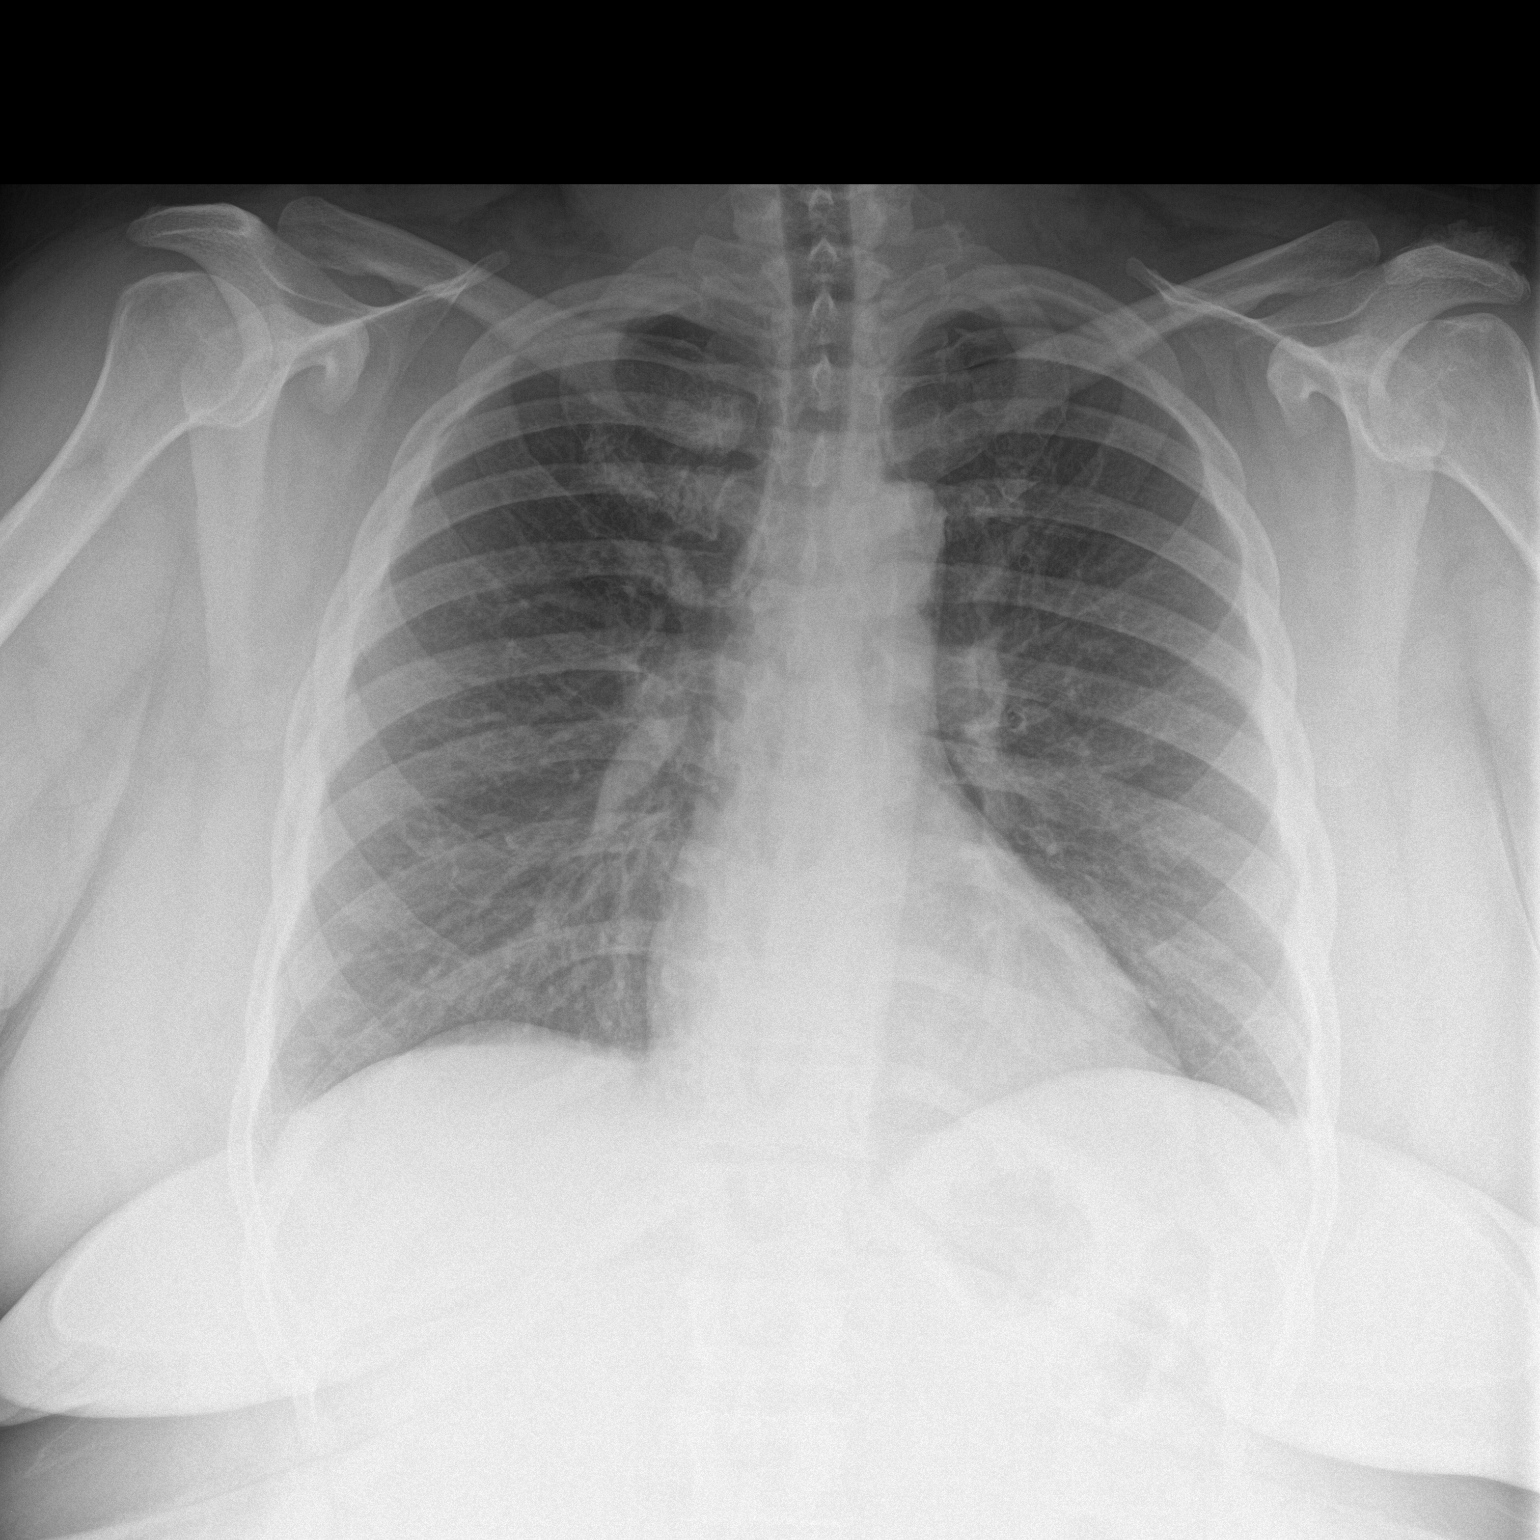

[chest lat]
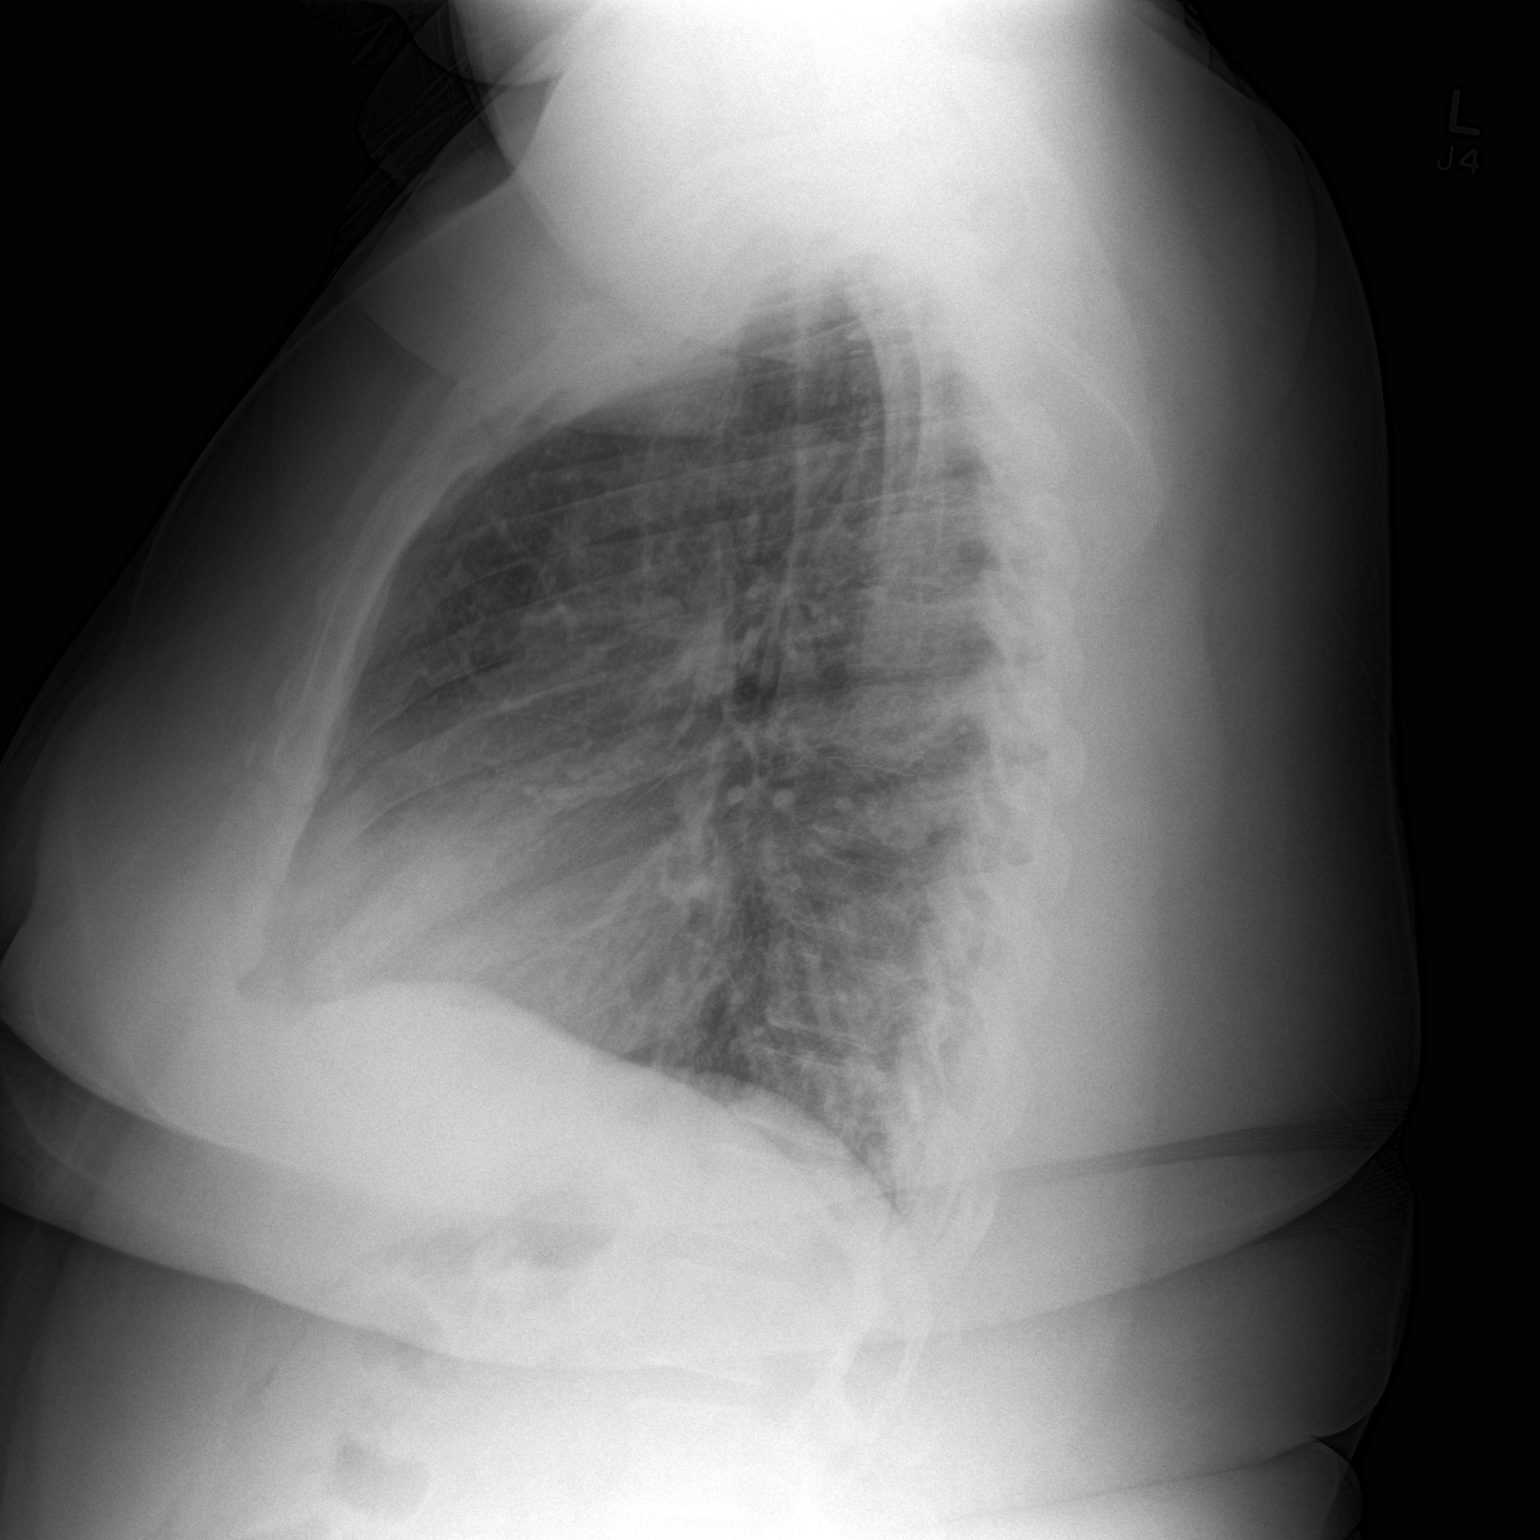

[2 of 2 positions shown; findings below may reference images not displayed]

FINDINGS: Chronic interstitial coarsening. There is no edema, consolidation,
effusion, or pneumothorax. Heart size and aortic contours are
normal.
IMPRESSION: Chronic bronchitic change without acute superimposed disease.

## 2015-07-03 ENCOUNTER — Ambulatory Visit: Payer: Self-pay | Admitting: Internal Medicine

## 2015-07-12 ENCOUNTER — Emergency Department (HOSPITAL_COMMUNITY): Payer: Self-pay

## 2015-07-12 ENCOUNTER — Encounter (HOSPITAL_COMMUNITY): Payer: Self-pay | Admitting: Emergency Medicine

## 2015-07-12 ENCOUNTER — Inpatient Hospital Stay (HOSPITAL_COMMUNITY)
Admission: EM | Admit: 2015-07-12 | Discharge: 2015-07-12 | DRG: 202 | Discharge status: Against Medical Advice | Payer: Self-pay | Attending: Internal Medicine | Admitting: Internal Medicine

## 2015-07-12 DIAGNOSIS — Z87891 Personal history of nicotine dependence: Secondary | ICD-10-CM

## 2015-07-12 DIAGNOSIS — K219 Gastro-esophageal reflux disease without esophagitis: Secondary | ICD-10-CM | POA: Diagnosis present

## 2015-07-12 DIAGNOSIS — J449 Chronic obstructive pulmonary disease, unspecified: Secondary | ICD-10-CM | POA: Diagnosis present

## 2015-07-12 DIAGNOSIS — Z6841 Body Mass Index (BMI) 40.0 and over, adult: Secondary | ICD-10-CM

## 2015-07-12 DIAGNOSIS — J4551 Severe persistent asthma with (acute) exacerbation: Principal | ICD-10-CM | POA: Diagnosis present

## 2015-07-12 DIAGNOSIS — F17201 Nicotine dependence, unspecified, in remission: Secondary | ICD-10-CM | POA: Diagnosis present

## 2015-07-12 DIAGNOSIS — G4733 Obstructive sleep apnea (adult) (pediatric): Secondary | ICD-10-CM | POA: Diagnosis present

## 2015-07-12 DIAGNOSIS — Z825 Family history of asthma and other chronic lower respiratory diseases: Secondary | ICD-10-CM

## 2015-07-12 DIAGNOSIS — Z79899 Other long term (current) drug therapy: Secondary | ICD-10-CM

## 2015-07-12 DIAGNOSIS — Z8249 Family history of ischemic heart disease and other diseases of the circulatory system: Secondary | ICD-10-CM

## 2015-07-12 DIAGNOSIS — I10 Essential (primary) hypertension: Secondary | ICD-10-CM | POA: Diagnosis present

## 2015-07-12 DIAGNOSIS — F329 Major depressive disorder, single episode, unspecified: Secondary | ICD-10-CM | POA: Diagnosis present

## 2015-07-12 DIAGNOSIS — J45901 Unspecified asthma with (acute) exacerbation: Secondary | ICD-10-CM

## 2015-07-12 DIAGNOSIS — Z809 Family history of malignant neoplasm, unspecified: Secondary | ICD-10-CM

## 2015-07-12 DIAGNOSIS — Z720 Tobacco use: Secondary | ICD-10-CM

## 2015-07-12 DIAGNOSIS — D72829 Elevated white blood cell count, unspecified: Secondary | ICD-10-CM | POA: Diagnosis present

## 2015-07-12 LAB — CBC WITH DIFFERENTIAL/PLATELET
Basophils Absolute: 0 10*3/uL (ref 0.0–0.1)
Basophils Relative: 0 %
Eosinophils Absolute: 0.3 10*3/uL (ref 0.0–0.7)
Eosinophils Relative: 2 %
HEMATOCRIT: 40.3 % (ref 36.0–46.0)
HEMOGLOBIN: 12.5 g/dL (ref 12.0–15.0)
LYMPHS ABS: 2.8 10*3/uL (ref 0.7–4.0)
LYMPHS PCT: 16 %
MCH: 28.5 pg (ref 26.0–34.0)
MCHC: 31 g/dL (ref 30.0–36.0)
MCV: 91.8 fL (ref 78.0–100.0)
MONO ABS: 1.5 10*3/uL — AB (ref 0.1–1.0)
MONOS PCT: 9 %
NEUTROS ABS: 12.8 10*3/uL — AB (ref 1.7–7.7)
Neutrophils Relative %: 73 %
PLATELETS: 284 10*3/uL (ref 150–400)
RBC: 4.39 MIL/uL (ref 3.87–5.11)
RDW: 16.3 % — AB (ref 11.5–15.5)
WBC: 17.5 10*3/uL — ABNORMAL HIGH (ref 4.0–10.5)

## 2015-07-12 LAB — BASIC METABOLIC PANEL
Anion gap: 11 (ref 5–15)
BUN: 15 mg/dL (ref 6–20)
CALCIUM: 8.5 mg/dL — AB (ref 8.9–10.3)
CO2: 23 mmol/L (ref 22–32)
Chloride: 106 mmol/L (ref 101–111)
Creatinine, Ser: 0.75 mg/dL (ref 0.44–1.00)
GFR calc Af Amer: >60 mL/min (ref >60)
GLUCOSE: 122 mg/dL — AB (ref 65–99)
POTASSIUM: 4.1 mmol/L (ref 3.5–5.1)
Sodium: 140 mmol/L (ref 135–145)

## 2015-07-12 LAB — I-STAT BETA HCG BLOOD, ED (MC, WL, AP ONLY): I-stat hCG, quantitative: <5 m[IU]/mL (ref <5)

## 2015-07-12 MED ORDER — PREDNISONE 20 MG PO TABS
60.0000 mg | ORAL_TABLET | Freq: Once | ORAL | Status: AC
  Administered 2015-07-12: 60 mg via ORAL
  Filled 2015-07-12: qty 3

## 2015-07-12 MED ORDER — NICOTINE 14 MG/24HR TD PT24
14.0000 mg | MEDICATED_PATCH | Freq: Every day | TRANSDERMAL | Status: DC
  Administered 2015-07-12: 14 mg via TRANSDERMAL
  Filled 2015-07-12: qty 1

## 2015-07-12 MED ORDER — LEVALBUTEROL HCL 0.63 MG/3ML IN NEBU: 0.6300 mg | INHALATION_SOLUTION | RESPIRATORY_TRACT | Status: DC | PRN

## 2015-07-12 MED ORDER — ONDANSETRON HCL 4 MG PO TABS: 4.0000 mg | ORAL_TABLET | Freq: Four times a day (QID) | ORAL | Status: DC | PRN

## 2015-07-12 MED ORDER — IPRATROPIUM BROMIDE 0.02 % IN SOLN
0.5000 mg | Freq: Once | RESPIRATORY_TRACT | Status: AC
  Administered 2015-07-12: 0.5 mg via RESPIRATORY_TRACT
  Filled 2015-07-12: qty 2.5

## 2015-07-12 MED ORDER — METHYLPREDNISOLONE SODIUM SUCC 125 MG IJ SOLR
60.0000 mg | Freq: Two times a day (BID) | INTRAMUSCULAR | Status: DC
  Filled 2015-07-12 (×2): qty 0.96

## 2015-07-12 MED ORDER — ONDANSETRON HCL 4 MG/2ML IJ SOLN: 4.0000 mg | Freq: Four times a day (QID) | INTRAMUSCULAR | Status: DC | PRN

## 2015-07-12 MED ORDER — IPRATROPIUM BROMIDE 0.02 % IN SOLN: 0.5000 mg | RESPIRATORY_TRACT | Status: DC | PRN

## 2015-07-12 MED ORDER — ZOLPIDEM TARTRATE 10 MG PO TABS: 10.0000 mg | ORAL_TABLET | Freq: Every evening | ORAL | Status: DC | PRN

## 2015-07-12 MED ORDER — LORATADINE 10 MG PO TABS: 10.0000 mg | ORAL_TABLET | Freq: Every day | ORAL | Status: DC

## 2015-07-12 MED ORDER — ACETAMINOPHEN 325 MG PO TABS
650.0000 mg | ORAL_TABLET | Freq: Four times a day (QID) | ORAL | Status: DC | PRN
  Filled 2015-07-12: qty 2

## 2015-07-12 MED ORDER — ALBUTEROL (5 MG/ML) CONTINUOUS INHALATION SOLN
10.0000 mg/h | INHALATION_SOLUTION | RESPIRATORY_TRACT | Status: DC
  Administered 2015-07-12: 10 mg/h via RESPIRATORY_TRACT
  Filled 2015-07-12: qty 20

## 2015-07-12 MED ORDER — IPRATROPIUM-ALBUTEROL 0.5-2.5 (3) MG/3ML IN SOLN
3.0000 mL | Freq: Once | RESPIRATORY_TRACT | Status: AC
  Administered 2015-07-12: 3 mL via RESPIRATORY_TRACT
  Filled 2015-07-12: qty 3

## 2015-07-12 MED ORDER — LISINOPRIL 10 MG PO TABS
10.0000 mg | ORAL_TABLET | Freq: Every day | ORAL | Status: DC
  Administered 2015-07-12: 10 mg via ORAL
  Filled 2015-07-12: qty 1

## 2015-07-12 MED ORDER — HYDROCHLOROTHIAZIDE 12.5 MG PO CAPS
12.5000 mg | ORAL_CAPSULE | Freq: Every day | ORAL | Status: DC
  Administered 2015-07-12: 12.5 mg via ORAL
  Filled 2015-07-12: qty 1

## 2015-07-12 MED ORDER — NICOTINE 21 MG/24HR TD PT24: 21.0000 mg | MEDICATED_PATCH | Freq: Once | TRANSDERMAL | Status: DC

## 2015-07-12 MED ORDER — IPRATROPIUM BROMIDE 0.02 % IN SOLN
0.5000 mg | RESPIRATORY_TRACT | Status: DC
  Administered 2015-07-12 (×2): 0.5 mg via RESPIRATORY_TRACT
  Filled 2015-07-12 (×2): qty 2.5

## 2015-07-12 MED ORDER — IPRATROPIUM-ALBUTEROL 0.5-2.5 (3) MG/3ML IN SOLN
3.0000 mL | RESPIRATORY_TRACT | Status: DC
  Administered 2015-07-12: 3 mL via RESPIRATORY_TRACT
  Filled 2015-07-12: qty 3

## 2015-07-12 MED ORDER — LEVALBUTEROL HCL 0.63 MG/3ML IN NEBU
0.6300 mg | INHALATION_SOLUTION | RESPIRATORY_TRACT | Status: DC
  Administered 2015-07-12 (×2): 0.63 mg via RESPIRATORY_TRACT
  Filled 2015-07-12 (×2): qty 3

## 2015-07-12 MED ORDER — ACETAMINOPHEN 650 MG RE SUPP: 650.0000 mg | Freq: Four times a day (QID) | RECTAL | Status: DC | PRN

## 2015-07-12 MED ORDER — FAMOTIDINE 20 MG PO TABS
20.0000 mg | ORAL_TABLET | Freq: Every day | ORAL | Status: DC
  Administered 2015-07-12: 20 mg via ORAL
  Filled 2015-07-12: qty 1

## 2015-07-12 MED ORDER — SERTRALINE HCL 50 MG PO TABS
50.0000 mg | ORAL_TABLET | Freq: Every day | ORAL | Status: DC
  Administered 2015-07-12: 50 mg via ORAL
  Filled 2015-07-12: qty 1

## 2015-07-12 MED ORDER — SODIUM CHLORIDE 0.9 % IV SOLN
INTRAVENOUS | Status: DC
  Administered 2015-07-12: 16:00:00 via INTRAVENOUS

## 2015-07-12 MED ORDER — MAGNESIUM SULFATE 2 GM/50ML IV SOLN
2.0000 g | Freq: Once | INTRAVENOUS | Status: AC
  Administered 2015-07-12: 2 g via INTRAVENOUS
  Filled 2015-07-12: qty 50

## 2015-07-12 MED ORDER — LISINOPRIL-HYDROCHLOROTHIAZIDE 10-12.5 MG PO TABS: 1.0000 | ORAL_TABLET | Freq: Every day | ORAL | Status: DC

## 2015-07-12 MED ORDER — ZOLPIDEM TARTRATE 5 MG PO TABS: 5.0000 mg | ORAL_TABLET | Freq: Every evening | ORAL | Status: DC | PRN

## 2015-07-12 NOTE — Progress Notes (Signed)
Pt stated that she wanted to leave because she felt better, and could give  herself nebulizer treatments at home. I told pt that she was getting IV solumedrol to reduce the inflammation in her lungs, and that she could not have that at home. I noted that pt was SOB when she was talking, and I could hear her wheezing.  I reported that to pt, but she still insisted on leaving.  Since pt wanted to leave AMA, I paged provider on call, and had pt sign the AMA form.  She left before I could do a full assessment.

## 2015-07-12 NOTE — ED Notes (Signed)
Pt transported to XRay 

## 2015-07-12 NOTE — ED Notes (Signed)
Bed: WA04 Expected date:  Expected time:  Means of arrival:  Comments: wheezing

## 2015-07-12 NOTE — ED Notes (Signed)
Pt can go to floor at 14:00. 

## 2015-07-12 NOTE — ED Notes (Signed)
Delay in medication administration; pt's name not in pyxis; pharmacy contacted

## 2015-07-12 NOTE — Progress Notes (Signed)
Pt left AMA.  Wanted to leave right away.  I went in her room, she was dressed, asked when I was going to take her IV out.  She stated that she "felt better" ,wanted to leave, and would see her pulmonologist tomorrow.

## 2015-07-12 NOTE — ED Provider Notes (Signed)
CSN: [id number]     Arrival date & time 07/12/15  0556 History   First MD Initiated Contact with Patient 07/12/15 [phone]     Chief Complaint  Patient presents with  . Wheezing     (Consider location/radiation/quality/duration/timing/severity/associated sxs/prior Treatment) HPI 45 year old female who presents with shortness of breath. History of severe persistent asthma, tobacco use, obesity, and OSA. Has very frequent asthma exacerbations, recently admitted in January 2017. States that she has recently been without her Dulera over the course of the past 1-2 weeks. Began to notice that she needed frequent albuterol inhaler usage over the past 1-2 days. Renal out of her albuterol inhaler this morning, and noted that walking to the bus began to feel very short of breath. Called EMS and given a breathing treatment in route. She denies any chest pain, lower extremity edema swelling or pain, coughing, congestion, sore throat, runny nose, fevers or chills. States this is consistent with her typical asthma exacerbation. Past Medical History  Diagnosis Date  . Acanthosis nigricans   . Menorrhagia   . Hypertension, essential   . Insomnia   . Morbid obesity ([place])   . Helicobacter pylori (H. pylori) infection   . Sleep apnea     Sleep study 2008 - mild OSA, not enough events to titrate CPAP  . COPD (chronic obstructive pulmonary disease) ([place])     PFTs in 2002, FEV1/FVC 65, no post bronchodilater test done  . Asthma     Followed by Dr. Annamaria Boots (pulmonology); receives every other week omalizumab injections; has frequent exacerbations  . Depression   . GERD (gastroesophageal reflux disease)   . Tobacco user   . Obesity   . Shortness of breath   . Headache(784.0)   . Seasonal allergies   . Arthritis    Past Surgical History  Procedure Laterality Date  . Tubal ligation  1996    bilateral  . Breast reduction surgery  09/2011   Family History  Problem Relation Age of Onset  . Asthma Daughter    . Cancer Paternal Aunt   . Asthma Maternal Grandmother   . Hypertension Mother    Social History  Substance Use Topics  . Smoking status: Former Smoker -- 18 years    Types: Cigarettes    Quit date: 09/15/2014  . Smokeless tobacco: Former Systems developer    Quit date: 09/12/2014  . Alcohol Use: No   OB History    No data available     Review of Systems 10/14 systems reviewed and are negative other than those stated in the HPI   Allergies  Review of patient's allergies indicates no known allergies.  Home Medications   Prior to Admission medications   Medication Sig Start Date End Date Taking? Authorizing Provider  albuterol (PROVENTIL HFA;VENTOLIN HFA) 108 (90 Base) MCG/ACT inhaler Inhale 1-2 puffs into the lungs every 4 (four) hours. 05/25/15  Yes Jeffrey Hedges, PA-C  albuterol (PROVENTIL) (2.5 MG/3ML) 0.083% nebulizer solution Take 3 mLs (2.5 mg total) by nebulization every 6 (six) hours as needed for wheezing or shortness of breath. 11/18/14  Yes Arnoldo Morale, MD  eszopiclone (LUNESTA) 2 MG TABS tablet Take 1 tablet (2 mg total) by mouth at bedtime as needed for sleep. Take immediately before bedtime 03/20/15  Yes Deneise Lever, MD  fexofenadine (ALLEGRA) 60 MG tablet Take 1 tablet (60 mg total) by mouth 2 (two) times daily. 02/08/15  Yes Tatyana Kirichenko, PA-C  ipratropium (ATROVENT) 0.02 % nebulizer solution Take 2.5 mLs (0.5 mg  total) by nebulization 4 (four) times daily as needed for wheezing or shortness of breath. 11/18/14  Yes Arnoldo Morale, MD  lisinopril-hydrochlorothiazide (PRINZIDE,ZESTORETIC) 10-12.5 MG per tablet Take 1 tablet by mouth daily. 11/18/14  Yes Arnoldo Morale, MD  mometasone-formoterol (DULERA) 200-5 MCG/ACT AERO Inhale 2 puffs into the lungs 2 (two) times daily. Rinse mouth after inhalation 04/04/15  Yes Deneise Lever, MD  nicotine (NICODERM CQ - DOSED IN MG/24 HOURS) 14 mg/24hr patch Place 1 patch (14 mg total) onto the skin daily. 03/14/15  Yes Smiley Houseman, MD  omalizumab Arvid Right) 150 MG injection Inject 150 mg into the skin every 14 (fourteen) days.   Yes Historical Provider, MD  ranitidine (ZANTAC) 150 MG tablet Take 2 tablets (300 mg total) by mouth at bedtime. 11/03/14  Yes [name], MD  sertraline (ZOLOFT) 50 MG tablet Take 1 tablet (50 mg total) by mouth daily. 11/18/14  Yes Arnoldo Morale, MD  zolpidem (AMBIEN) 10 MG tablet Take 10 mg by mouth at bedtime as needed for sleep.   Yes Historical Provider, MD  acetaminophen (TYLENOL) 325 MG tablet Take 2 tablets (650 mg total) by mouth every 6 (six) hours as needed for mild pain, moderate pain or headache. Patient not taking: Reported on 05/25/2015 05/24/15   [name], MD  azithromycin (ZITHROMAX) 250 MG tablet Take 1 tablet (250 mg total) by mouth daily. Take first 2 tablets together, then 1 every day until finished. Patient not taking: Reported on 07/12/2015 05/25/15   Okey Regal, PA-C  predniSONE (DELTASONE) 10 MG tablet Take 60mg  daily for 3 days, then 40mg  for 3 days, then 30mg  for 3 days, then 20mg  thereafter Patient not taking: Reported on 07/12/2015 05/24/15   [name], MD   BP 189/83 mmHg  Pulse 83  Temp(Src) 98.9 F (37.2 C) (Oral)  Resp 24  SpO2 100%  LMP 06/14/2015 Physical Exam Physical Exam  Nursing note and vitals reviewed. Constitutional: Well developed, well nourished, non-toxic, and in no acute distress Head: Normocephalic and atraumatic.  Mouth/Throat: Oropharynx is clear and moist.  Neck: Normal range of motion. Neck supple.  Cardiovascular: Normal rate and regular rhythm.   Pulmonary/Chest: Effort normal. No conversational dyspnea. Diffuse expiratory wheezes with moderate air movement on lung auscultation. Abdominal: Soft. Obese. There is no tenderness. There is no rebound and no guarding.  Musculoskeletal: Normal range of motion.  Neurological: Alert, no facial droop, fluent speech, moves all extremities symmetrically Skin: Skin is warm and dry.   Psychiatric: Cooperative  ED Course  Procedures (including critical care time) Labs Review Labs Reviewed  CBC WITH DIFFERENTIAL/PLATELET - Abnormal; Notable for the following:    WBC 17.5 (*)    RDW 16.3 (*)    Neutro Abs 12.8 (*)    Monocytes Absolute 1.5 (*)    All other components within normal limits  BASIC METABOLIC PANEL - Abnormal; Notable for the following:    Glucose, Bld 122 (*)    Calcium 8.5 (*)    All other components within normal limits  I-STAT BETA HCG BLOOD, ED ([facility], [facility], [facility] ONLY)    Imaging Review Dg Chest 2 View  07/12/2015  CLINICAL DATA:  Shortness of breath with cough and wheezing for 1 day EXAM: CHEST  2 VIEW COMPARISON:  May 25, 2015 FINDINGS: Lungs are clear. Heart size and pulmonary vascularity are normal. No adenopathy. No bone lesions. IMPRESSION: No edema or consolidation. Electronically Signed   By: Lowella Grip III M.D.   On: 07/12/2015  07:25   I have personally reviewed and evaluated these images and lab results as part of my medical decision-making.   EKG Interpretation   Date/Time:  Wednesday July 12 2015 07:34:44 EDT Ventricular Rate:  87 PR Interval:  138 QRS Duration: 84 QT Interval:  380 QTC Calculation: 457 R Axis:     Text Interpretation:  Sinus rhythm Borderline T wave abnormalities No  significant change since last tracing Confirmed by Jessejames Steelman MD, Pelham Hennick [phone]) on  07/12/2015 8:06:26 AM       CRITICAL CARE Performed by: Forde Dandy  ? Total critical care time: 35 minutes  Critical care time was exclusive of separately billable procedures and treating other patients.  Critical care was necessary to treat or prevent imminent or life-threatening deterioration.  Critical care was time spent personally by me on the following activities: development of treatment plan with patient and/or surrogate as well as nursing, discussions with consultants, evaluation of patient's response to treatment, examination of patient, obtaining  history from patient or surrogate, ordering and performing treatments and interventions, ordering and review of laboratory studies, ordering and review of radiographic studies, pulse oximetry and re-evaluation of patient's condition.   MDM   Final diagnoses:  Asthma exacerbation    45 year old female with history of severe persistent asthma who presents with shortness of breath. Presentation is nontoxic and in no acute distress. She is tachypneic, but without conversational dyspnea, andwith  normal oxygenation on room air. With limited air movement and diffuse expiratory wheezes. Overall presentation seems consistent with that of her typical asthma exacerbation. Received prednisone, magnesium sulfate, and placed on continuous albuterol with atrovent for one hour. On reevaluation, does have some mildly increased air movement but with continued wheezing and dyspnea with minimal activity, walking around her ED stretcher to a chair. She is requiring likely more frequent breathing treatments, and I have discussed this with Dr. Charlies Silvers will admit her onto the hospitalist service for ongoing treatment.  Forde Dandy, MD 07/12/15 [phone]

## 2015-07-12 NOTE — ED Notes (Signed)
Pt BIB EMS for asthma and wheezing; pt woke up with SOB and used her inhaler without relief; on pt's walk to bus stop she experienced SOB and called EMS; pt receiving 2nd breathing treatment on arrival. SpO2 100%

## 2015-07-12 NOTE — H&P (Addendum)
[facility] History and Physical  April [name] [id number] DOB: August 09, 1970 DOA: 07/12/2015  Referring physician: ER physician: Dr. Brantley Stage PCP: [name], MD  Chief Complaint: shortness of breath  HPI:  45 year old female with past medical history significant for asthma, hypertension, tobacco use who presented to [facility] with worsening shortness of breath over past few days and even worse in last 24 hours prior to this admission. Patient reports shortness of breath at rest as well as with exertion. She used her inhaler and nebulizer treatment more than every 2 hours a day with no significant symptomatic relief. No reports of fevers or chills. She does have intermittent nonproductive cough. She does have some chest pain but only with coughing. No palpitations. No reports of abdominal pain, nausea or vomiting. No diarrhea or constipation. No blood in the stool or urine. No urinary complaints.  In ED, patient was hemodynamically stable. Blood work was significant for leukocytosis of 17.5.chest x-ray showed no edema or consolidation.In ED, she was given nebulizer treatment DuoNeb 2 times and then separately albuterol and Atrovent one time in addition to prednisone 60 mg one-time dose with some improvement but she still short of breath and wheezing for which reason [facility] asked for the admission for management of acute asthma exacerbation.   Assessment & Plan    Active Problems:   Acute asthma exacerbation - Saturating 94 200% with nasal cannula oxygen support - We will continue nebulizer treatments, Atrovent and Xopenex every 4 hours as scheduled as well as every 2 hours as needed for shortness of breath or wheezing - Start Solu-Medrol 60 mg IV every 12 hours - Continue oxygen support via nasal cannula to keep oxygen saturation above 90%    Essential hypertension - Resume home med, Prinzide    Leukocytosis - Likely from steroids - No evidence of acute cardiopulmonary  process on chest x-ray    Depression - Continue sertraline    Tobacco abuse - Nicotine patch ordered  DVT prophylaxis:  - SCD's bilaterally   Radiological Exams on Admission: Dg Chest 2 View 07/12/2015 No edema or consolidation. Electronically Signed   By: Lowella Grip III M.D.   On: 07/12/2015 07:25    EKG: I have personally reviewed EKG. EKG shows sinus rhythm  Code Status: Full Family Communication: Plan of care discussed with the patient  Disposition Plan: Admit for further evaluation, medical floor   Leisa Lenz, MD  [facility] Pager [phone]  Time spent in minutes: 55 minutes  Review of Systems:  Constitutional: Negative for fever, chills and malaise/fatigue. Negative for diaphoresis.  HENT: Negative for hearing loss, ear pain, nosebleeds, congestion, sore throat, neck pain, tinnitus and ear discharge.   Eyes: Negative for blurred vision, double vision, photophobia, pain, discharge and redness.  Respiratory: per HPI Cardiovascular: Negative for chest pain, palpitations, orthopnea, claudication and leg swelling.  Gastrointestinal: Negative for nausea, vomiting and abdominal pain. Negative for heartburn, constipation, blood in stool and melena.  Genitourinary: Negative for dysuria, urgency, frequency, hematuria and flank pain.  Musculoskeletal: Negative for myalgias, back pain, joint pain and falls.  Skin: Negative for itching and rash.  Neurological: Negative for dizziness and weakness. Negative for tingling, tremors, sensory change, speech change, focal weakness, loss of consciousness and headaches.  Endo/Heme/Allergies: Negative for environmental allergies and polydipsia. Does not bruise/bleed easily.  Psychiatric/Behavioral: Negative for suicidal ideas. The patient is not nervous/anxious.      Past Medical History  Diagnosis Date  . Acanthosis nigricans   .  Menorrhagia   . Hypertension, essential   . Insomnia   . Morbid obesity ([place])   .  Helicobacter pylori (H. pylori) infection   . Sleep apnea     Sleep study 2008 - mild OSA, not enough events to titrate CPAP  . COPD (chronic obstructive pulmonary disease) ([place])     PFTs in 2002, FEV1/FVC 65, no post bronchodilater test done  . Asthma     Followed by Dr. Annamaria Boots (pulmonology); receives every other week omalizumab injections; has frequent exacerbations  . Depression   . GERD (gastroesophageal reflux disease)   . Tobacco user   . Obesity   . Shortness of breath   . Headache(784.0)   . Seasonal allergies   . Arthritis    Past Surgical History  Procedure Laterality Date  . Tubal ligation  1996    bilateral  . Breast reduction surgery  09/2011   Social History:  reports that she quit smoking about 9 months ago. Her smoking use included Cigarettes. She quit after 18 years of use. She quit smokeless tobacco use about 9 months ago. She reports that she does not drink alcohol or use illicit drugs.  No Known Allergies  Family History:  Family History  Problem Relation Age of Onset  . Asthma Daughter   . Cancer Paternal Aunt   . Asthma Maternal Grandmother   . Hypertension Mother      Prior to Admission medications   Medication Sig Start Date End Date Taking? Authorizing Provider  albuterol (PROVENTIL HFA;VENTOLIN HFA) 108 (90 Base) MCG/ACT inhaler Inhale 1-2 puffs into the lungs every 4 (four) hours. 05/25/15  Yes Jeffrey Hedges, PA-C  albuterol (PROVENTIL) (2.5 MG/3ML) 0.083% nebulizer solution Take 3 mLs (2.5 mg total) by nebulization every 6 (six) hours as needed for wheezing or shortness of breath. 11/18/14  Yes Arnoldo Morale, MD  eszopiclone (LUNESTA) 2 MG TABS tablet Take 1 tablet (2 mg total) by mouth at bedtime as needed for sleep. Take immediately before bedtime 03/20/15  Yes Deneise Lever, MD  fexofenadine (ALLEGRA) 60 MG tablet Take 1 tablet (60 mg total) by mouth 2 (two) times daily. 02/08/15  Yes Tatyana Kirichenko, PA-C  ipratropium (ATROVENT) 0.02 %  nebulizer solution Take 2.5 mLs (0.5 mg total) by nebulization 4 (four) times daily as needed for wheezing or shortness of breath. 11/18/14  Yes Arnoldo Morale, MD  lisinopril-hydrochlorothiazide (PRINZIDE,ZESTORETIC) 10-12.5 MG per tablet Take 1 tablet by mouth daily. 11/18/14  Yes Arnoldo Morale, MD  mometasone-formoterol (DULERA) 200-5 MCG/ACT AERO Inhale 2 puffs into the lungs 2 (two) times daily. Rinse mouth after inhalation 04/04/15  Yes Deneise Lever, MD  nicotine (NICODERM CQ - DOSED IN MG/24 HOURS) 14 mg/24hr patch Place 1 patch (14 mg total) onto the skin daily. 03/14/15  Yes Smiley Houseman, MD  omalizumab Arvid Right) 150 MG injection Inject 150 mg into the skin every 14 (fourteen) days.   Yes Historical Provider, MD  ranitidine (ZANTAC) 150 MG tablet Take 2 tablets (300 mg total) by mouth at bedtime. 11/03/14  Yes [name], MD  sertraline (ZOLOFT) 50 MG tablet Take 1 tablet (50 mg total) by mouth daily. 11/18/14  Yes Arnoldo Morale, MD  zolpidem (AMBIEN) 10 MG tablet Take 10 mg by mouth at bedtime as needed for sleep.   Yes Historical Provider, MD  acetaminophen (TYLENOL) 325 MG tablet Take 2 tablets (650 mg total) by mouth every 6 (six) hours as needed for mild pain, moderate pain or headache. Patient not  taking: Reported on 05/25/2015 05/24/15   [name], MD  azithromycin (ZITHROMAX) 250 MG tablet Take 1 tablet (250 mg total) by mouth daily. Take first 2 tablets together, then 1 every day until finished. Patient not taking: Reported on 07/12/2015 05/25/15   Okey Regal, PA-C  predniSONE (DELTASONE) 10 MG tablet Take 60mg  daily for 3 days, then 40mg  for 3 days, then 30mg  for 3 days, then 20mg  thereafter Patient not taking: Reported on 07/12/2015 05/24/15   [name], MD   Physical Exam: Filed Vitals:   07/12/15 0645 07/12/15 0700 07/12/15 0735 07/12/15 0956  BP:  145/94 148/95   Pulse: 95 75  81  Temp:   98.6 F (37 C)   TempSrc:   Oral   Resp: 28 20 20 18   SpO2: 99% 98% 94% 100%     Physical Exam  Constitutional: Appears well-developed and well-nourished. No distress.  HENT: Normocephalic. No tonsillar erythema or exudates Eyes: Conjunctivae are normal. No scleral icterus.  Neck: Normal ROM. Neck supple. No JVD. No tracheal deviation. No thyromegaly.  CVS: tachcyardic, S1/S2 +, no murmurs, no gallops, no carotid bruit.  Pulmonary:wheezing in upper and mid lung lobes, no rhonchi  Abdominal: Soft. BS +,  no distension, tenderness, rebound or guarding.  Musculoskeletal: Normal range of motion. No edema and no tenderness.  Lymphadenopathy: No lymphadenopathy noted, cervical, inguinal. Neuro: Alert. Normal reflexes, muscle tone coordination. No focal neurologic deficits. Skin: Skin is warm and dry. No rash noted.  No erythema. No pallor.  Psychiatric: Normal mood and affect. Behavior, judgment, thought content normal.   Labs on Admission:  Basic Metabolic Panel:  Recent Labs Lab 07/12/15 0811  NA 140  K 4.1  CL 106  CO2 23  GLUCOSE 122*  BUN 15  CREATININE 0.75  CALCIUM 8.5*   Liver Function Tests: No results for input(s): AST, ALT, ALKPHOS, BILITOT, PROT, ALBUMIN in the last 168 hours. No results for input(s): LIPASE, AMYLASE in the last 168 hours. No results for input(s): AMMONIA in the last 168 hours. CBC:  Recent Labs Lab 07/12/15 0811  WBC 17.5*  NEUTROABS 12.8*  HGB 12.5  HCT 40.3  MCV 91.8  PLT 284   Cardiac Enzymes: No results for input(s): CKTOTAL, CKMB, CKMBINDEX, TROPONINI in the last 168 hours. BNP: Invalid input(s): POCBNP CBG: No results for input(s): GLUCAP in the last 168 hours.  If 7PM-7AM, please contact night-coverage [email or web address] Password [facility] 07/12/2015, 12:40 PM

## 2015-07-13 ENCOUNTER — Telehealth: Payer: Self-pay | Admitting: Internal Medicine

## 2015-07-13 MED ORDER — MOMETASONE FURO-FORMOTEROL FUM 100-5 MCG/ACT IN AERO: 2.0000 | INHALATION_SPRAY | Freq: Two times a day (BID) | RESPIRATORY_TRACT | Status: DC

## 2015-07-13 MED ORDER — PREDNISONE 5 MG PO TABS: ORAL_TABLET | ORAL | Status: DC

## 2015-07-13 MED ORDER — ALBUTEROL SULFATE HFA 108 (90 BASE) MCG/ACT IN AERS: 1.0000 | INHALATION_SPRAY | RESPIRATORY_TRACT | Status: DC

## 2015-07-13 NOTE — Telephone Encounter (Signed)
Patient called and was advised meds were called in.  States she is getting her allergy injection tomorrow and will make appointment for HFU when she is here.

## 2015-07-13 NOTE — Telephone Encounter (Signed)
rx's called in to pharmacy for pt. lmtcb X1 to make pt aware-pt needs hfu with tp or cy- pt has no pending office visit.

## 2015-07-13 NOTE — Telephone Encounter (Signed)
Pt was in the hospital yesterday for asthma exacerbation (in epic) but do not see a d/c summary as of yet. Pt has been to the ED 8 times in the past 6 months.  She is requesting refill dulera, proair and prednisone. Pt reports she was on a pred taper prior to being in the hospital and not sure how much she should be taking but knows she needs prednisone. Please advise Dr. Annamaria Boots thanks  Current Outpatient Prescriptions on File Prior to Visit  Medication Sig Dispense Refill  . acetaminophen (TYLENOL) 325 MG tablet Take 2 tablets (650 mg total) by mouth every 6 (six) hours as needed for mild pain, moderate pain or headache. (Patient not taking: Reported on 05/25/2015) 30 tablet 0  . albuterol (PROVENTIL HFA;VENTOLIN HFA) 108 (90 Base) MCG/ACT inhaler Inhale 1-2 puffs into the lungs every 4 (four) hours. 1 Inhaler 0  . albuterol (PROVENTIL) (2.5 MG/3ML) 0.083% nebulizer solution Take 3 mLs (2.5 mg total) by nebulization every 6 (six) hours as needed for wheezing or shortness of breath. 150 mL 1  . azithromycin (ZITHROMAX) 250 MG tablet Take 1 tablet (250 mg total) by mouth daily. Take first 2 tablets together, then 1 every day until finished. (Patient not taking: Reported on 07/12/2015) 6 tablet 0  . eszopiclone (LUNESTA) 2 MG TABS tablet Take 1 tablet (2 mg total) by mouth at bedtime as needed for sleep. Take immediately before bedtime 30 tablet 5  . fexofenadine (ALLEGRA) 60 MG tablet Take 1 tablet (60 mg total) by mouth 2 (two) times daily. 30 tablet 0  . ipratropium (ATROVENT) 0.02 % nebulizer solution Take 2.5 mLs (0.5 mg total) by nebulization 4 (four) times daily as needed for wheezing or shortness of breath. 75 mL 2  . lisinopril-hydrochlorothiazide (PRINZIDE,ZESTORETIC) 10-12.5 MG per tablet Take 1 tablet by mouth daily. 30 tablet 2  . mometasone-formoterol (DULERA) 200-5 MCG/ACT AERO Inhale 2 puffs into the lungs 2 (two) times daily. Rinse mouth after inhalation 2 Inhaler 0  . nicotine (NICODERM  CQ - DOSED IN MG/24 HOURS) 14 mg/24hr patch Place 1 patch (14 mg total) onto the skin daily. 28 patch 0  . omalizumab (XOLAIR) 150 MG injection Inject 150 mg into the skin every 14 (fourteen) days.    . predniSONE (DELTASONE) 10 MG tablet Take 60mg  daily for 3 days, then 40mg  for 3 days, then 30mg  for 3 days, then 20mg  thereafter (Patient not taking: Reported on 07/12/2015) 60 tablet 2  . ranitidine (ZANTAC) 150 MG tablet Take 2 tablets (300 mg total) by mouth at bedtime. 180 tablet 3  . sertraline (ZOLOFT) 50 MG tablet Take 1 tablet (50 mg total) by mouth daily. 30 tablet 1  . zolpidem (AMBIEN) 10 MG tablet Take 10 mg by mouth at bedtime as needed for sleep.     No current facility-administered medications on file prior to visit.

## 2015-07-13 NOTE — Telephone Encounter (Signed)
Ok Rx Dulera 100, # 1, inhale 2 puffs then rinse mouth, twice daily              Prednisone 5 mg, # 60, 2 daily x7 days, then one or two daily              Proair rescue inhaler    # 1, 2 puffs every 4-6 hours as needed rescue inhaler             Continue regular Xolair shots here  Make an appointment she can keep for ROV folow-up

## 2015-07-14 ENCOUNTER — Emergency Department (HOSPITAL_COMMUNITY)
Admission: EM | Admit: 2015-07-14 | Discharge: 2015-07-14 | Disposition: A | Discharge status: Home / Self Care | Payer: MEDICAID | Attending: Emergency Medicine | Admitting: Emergency Medicine

## 2015-07-14 ENCOUNTER — Ambulatory Visit: Payer: Self-pay

## 2015-07-14 ENCOUNTER — Encounter (HOSPITAL_COMMUNITY): Payer: Self-pay | Admitting: *Deleted

## 2015-07-14 DIAGNOSIS — I10 Essential (primary) hypertension: Secondary | ICD-10-CM | POA: Insufficient documentation

## 2015-07-14 DIAGNOSIS — J441 Chronic obstructive pulmonary disease with (acute) exacerbation: Secondary | ICD-10-CM | POA: Insufficient documentation

## 2015-07-14 LAB — CBC
HEMATOCRIT: 38.1 % (ref 36.0–46.0)
HEMOGLOBIN: 11.9 g/dL — AB (ref 12.0–15.0)
MCH: 28.5 pg (ref 26.0–34.0)
MCHC: 31.2 g/dL (ref 30.0–36.0)
MCV: 91.4 fL (ref 78.0–100.0)
Platelets: 302 10*3/uL (ref 150–400)
RBC: 4.17 MIL/uL (ref 3.87–5.11)
RDW: 16.4 % — ABNORMAL HIGH (ref 11.5–15.5)
WBC: 15.6 10*3/uL — ABNORMAL HIGH (ref 4.0–10.5)

## 2015-07-14 LAB — BASIC METABOLIC PANEL
ANION GAP: 9 (ref 5–15)
BUN: 11 mg/dL (ref 6–20)
CO2: 26 mmol/L (ref 22–32)
Calcium: 8.6 mg/dL — ABNORMAL LOW (ref 8.9–10.3)
Chloride: 104 mmol/L (ref 101–111)
Creatinine, Ser: 0.75 mg/dL (ref 0.44–1.00)
GFR calc non Af Amer: >60 mL/min (ref >60)
GLUCOSE: 100 mg/dL — AB (ref 65–99)
POTASSIUM: 3.7 mmol/L (ref 3.5–5.1)
Sodium: 139 mmol/L (ref 135–145)

## 2015-07-14 MED ORDER — ALBUTEROL SULFATE (2.5 MG/3ML) 0.083% IN NEBU
5.0000 mg | INHALATION_SOLUTION | Freq: Once | RESPIRATORY_TRACT | Status: AC
  Administered 2015-07-14: 5 mg via RESPIRATORY_TRACT
  Filled 2015-07-14: qty 6

## 2015-07-14 MED FILL — !VENTOLIN HFA INHALER: 108 (90 BAS | 25 days supply | Qty: 18 | Fill #0

## 2015-07-14 MED FILL — predniSONE 5 MG TABS: 5 | 30 days supply | Qty: 60 | Fill #0

## 2015-07-14 NOTE — ED Notes (Signed)
Pt c/o shob, one albuterol treatment given with clearing lungs, pt reports nonproductive cough. Pt here 2 days ago for same and left AMA

## 2015-07-14 NOTE — ED Notes (Signed)
No answer in WR

## 2015-07-19 ENCOUNTER — Telehealth: Payer: Self-pay | Admitting: Internal Medicine

## 2015-07-19 NOTE — Telephone Encounter (Signed)
Pt aware that her Pred Rx was sent to the [facility] 07/13/15 Pt states that she will call if they have not received the Rx. Nothing further needed.

## 2015-07-19 NOTE — Telephone Encounter (Signed)
Pred already sent on 07/13/15   Medication Detail       Disp Refills Start End      predniSONE (DELTASONE) 5 MG tablet 60 tablet 0 07/13/2015      Sig: Take 2 tabs qd X7 days, then 1 tab daily.     Patient not taking: Reported on 07/14/2015         E-Prescribing Status: Receipt confirmed by pharmacy (07/13/2015 4:15 PM EDT)     [facility] for the pt

## 2015-07-19 NOTE — Telephone Encounter (Signed)
[phone] calling back

## 2015-07-21 ENCOUNTER — Emergency Department (HOSPITAL_COMMUNITY)
Admission: EM | Admit: 2015-07-21 | Discharge: 2015-07-21 | Disposition: A | Discharge status: Home / Self Care | Payer: MEDICAID | Attending: Emergency Medicine | Admitting: Emergency Medicine

## 2015-07-21 ENCOUNTER — Encounter (HOSPITAL_COMMUNITY): Payer: Self-pay | Admitting: Emergency Medicine

## 2015-07-21 DIAGNOSIS — Z8739 Personal history of other diseases of the musculoskeletal system and connective tissue: Secondary | ICD-10-CM | POA: Insufficient documentation

## 2015-07-21 DIAGNOSIS — Z79899 Other long term (current) drug therapy: Secondary | ICD-10-CM | POA: Insufficient documentation

## 2015-07-21 DIAGNOSIS — K219 Gastro-esophageal reflux disease without esophagitis: Secondary | ICD-10-CM | POA: Insufficient documentation

## 2015-07-21 DIAGNOSIS — Z87891 Personal history of nicotine dependence: Secondary | ICD-10-CM | POA: Insufficient documentation

## 2015-07-21 DIAGNOSIS — G4700 Insomnia, unspecified: Secondary | ICD-10-CM | POA: Insufficient documentation

## 2015-07-21 DIAGNOSIS — J45901 Unspecified asthma with (acute) exacerbation: Secondary | ICD-10-CM

## 2015-07-21 DIAGNOSIS — Z7951 Long term (current) use of inhaled steroids: Secondary | ICD-10-CM | POA: Insufficient documentation

## 2015-07-21 DIAGNOSIS — Z872 Personal history of diseases of the skin and subcutaneous tissue: Secondary | ICD-10-CM | POA: Insufficient documentation

## 2015-07-21 DIAGNOSIS — Z8619 Personal history of other infectious and parasitic diseases: Secondary | ICD-10-CM | POA: Insufficient documentation

## 2015-07-21 DIAGNOSIS — J441 Chronic obstructive pulmonary disease with (acute) exacerbation: Secondary | ICD-10-CM | POA: Insufficient documentation

## 2015-07-21 DIAGNOSIS — Z8742 Personal history of other diseases of the female genital tract: Secondary | ICD-10-CM | POA: Insufficient documentation

## 2015-07-21 DIAGNOSIS — F329 Major depressive disorder, single episode, unspecified: Secondary | ICD-10-CM | POA: Insufficient documentation

## 2015-07-21 MED ORDER — LISINOPRIL-HYDROCHLOROTHIAZIDE 10-12.5 MG PO TABS: 1.0000 | ORAL_TABLET | Freq: Once | ORAL | Status: DC

## 2015-07-21 MED ORDER — HYDROCHLOROTHIAZIDE 12.5 MG PO CAPS
12.5000 mg | ORAL_CAPSULE | Freq: Once | ORAL | Status: AC
  Administered 2015-07-21: 12.5 mg via ORAL
  Filled 2015-07-21: qty 1

## 2015-07-21 MED ORDER — LISINOPRIL 10 MG PO TABS
10.0000 mg | ORAL_TABLET | Freq: Once | ORAL | Status: AC
Start: 2015-07-21 — End: 2015-07-21
  Administered 2015-07-21: 10 mg via ORAL
  Filled 2015-07-21: qty 1

## 2015-07-21 MED ORDER — ALBUTEROL SULFATE (2.5 MG/3ML) 0.083% IN NEBU
INHALATION_SOLUTION | RESPIRATORY_TRACT | Status: AC
  Filled 2015-07-21: qty 6

## 2015-07-21 MED ORDER — ALBUTEROL SULFATE (2.5 MG/3ML) 0.083% IN NEBU
5.0000 mg | INHALATION_SOLUTION | Freq: Once | RESPIRATORY_TRACT | Status: AC
Start: 2015-07-21 — End: 2015-07-21
  Administered 2015-07-21: 5 mg via RESPIRATORY_TRACT

## 2015-07-21 MED ORDER — ALBUTEROL (5 MG/ML) CONTINUOUS INHALATION SOLN
10.0000 mg/h | INHALATION_SOLUTION | RESPIRATORY_TRACT | Status: DC
  Administered 2015-07-21: 10 mg/h via RESPIRATORY_TRACT
  Filled 2015-07-21: qty 20

## 2015-07-21 MED ORDER — MAGNESIUM SULFATE 2 GM/50ML IV SOLN
2.0000 g | Freq: Once | INTRAVENOUS | Status: AC
  Administered 2015-07-21: 2 g via INTRAVENOUS
  Filled 2015-07-21: qty 50

## 2015-07-21 MED ORDER — PREDNISONE 20 MG PO TABS: ORAL_TABLET | ORAL | Status: DC

## 2015-07-21 MED ORDER — METHYLPREDNISOLONE SODIUM SUCC 125 MG IJ SOLR
125.0000 mg | Freq: Once | INTRAMUSCULAR | Status: AC
  Administered 2015-07-21: 125 mg via INTRAVENOUS
  Filled 2015-07-21: qty 2

## 2015-07-21 MED ORDER — IPRATROPIUM BROMIDE 0.02 % IN SOLN
0.5000 mg | Freq: Once | RESPIRATORY_TRACT | Status: AC
  Administered 2015-07-21: 0.5 mg via RESPIRATORY_TRACT
  Filled 2015-07-21: qty 2.5

## 2015-07-21 NOTE — ED Notes (Signed)
Pt. reports persistent asthma attack for the last several days with wheezing /dry cough and chest congestion unrelieved by MDI / nebulizer treatments  at home , hypertensive at triage - pt. stated she has not taken her antihypertensive medication today .

## 2015-07-21 NOTE — ED Provider Notes (Signed)
CSN: [id number]     Arrival date & time 07/21/15  0319 History   First MD Initiated Contact with Patient 07/21/15 0407     Chief Complaint  Patient presents with  . Asthma     (Consider location/radiation/quality/duration/timing/severity/associated sxs/prior Treatment) Patient is a 45 y.o. female presenting with asthma and shortness of breath.  Asthma Associated symptoms include shortness of breath. Pertinent negatives include no chest pain, no abdominal pain and no headaches.  Shortness of Breath Severity:  Mild Onset quality:  Gradual Duration:  2 weeks Progression:  Worsening Chronicity:  Recurrent Context: not activity   Ineffective treatments:  Inhaler Associated symptoms: no abdominal pain, no chest pain, no fever, no headaches, no rash, no syncope and no vomiting     Past Medical History  Diagnosis Date  . Acanthosis nigricans   . Menorrhagia   . Hypertension, essential   . Insomnia   . Morbid obesity ([place])   . Helicobacter pylori (H. pylori) infection   . Sleep apnea     Sleep study 2008 - mild OSA, not enough events to titrate CPAP  . COPD (chronic obstructive pulmonary disease) ([place])     PFTs in 2002, FEV1/FVC 65, no post bronchodilater test done  . Asthma     Followed by Dr. Annamaria Boots (pulmonology); receives every other week omalizumab injections; has frequent exacerbations  . Depression   . GERD (gastroesophageal reflux disease)   . Tobacco user   . Obesity   . Shortness of breath   . Headache(784.0)   . Seasonal allergies   . Arthritis    Past Surgical History  Procedure Laterality Date  . Tubal ligation  1996    bilateral  . Breast reduction surgery  09/2011   Family History  Problem Relation Age of Onset  . Asthma Daughter   . Cancer Paternal Aunt   . Asthma Maternal Grandmother   . Hypertension Mother    Social History  Substance Use Topics  . Smoking status: Former Smoker -- 18 years    Types: Cigarettes    Quit date: 09/15/2014  .  Smokeless tobacco: Former Systems developer    Quit date: 09/12/2014  . Alcohol Use: No   OB History    No data available     Review of Systems  Constitutional: Negative for fever.  Respiratory: Positive for shortness of breath.   Cardiovascular: Negative for chest pain and syncope.  Gastrointestinal: Negative for vomiting and abdominal pain.  Skin: Negative for rash.  Neurological: Negative for headaches.  All other systems reviewed and are negative.     Allergies  Review of patient's allergies indicates no known allergies.  Home Medications   Prior to Admission medications   Medication Sig Start Date End Date Taking? Authorizing Provider  albuterol (PROVENTIL HFA;VENTOLIN HFA) 108 (90 Base) MCG/ACT inhaler Inhale 1-2 puffs into the lungs every 4 (four) hours. Patient taking differently: Inhale 1-2 puffs into the lungs every 4 (four) hours as needed for wheezing or shortness of breath.  07/13/15  Yes Deneise Lever, MD  albuterol (PROVENTIL) (2.5 MG/3ML) 0.083% nebulizer solution Take 3 mLs (2.5 mg total) by nebulization every 6 (six) hours as needed for wheezing or shortness of breath. 11/18/14  Yes Arnoldo Morale, MD  eszopiclone (LUNESTA) 2 MG TABS tablet Take 1 tablet (2 mg total) by mouth at bedtime as needed for sleep. Take immediately before bedtime 03/20/15  Yes Deneise Lever, MD  fexofenadine (ALLEGRA) 60 MG tablet Take 1 tablet (60 mg total)  by mouth 2 (two) times daily. 02/08/15  Yes Tatyana Kirichenko, PA-C  ipratropium (ATROVENT) 0.02 % nebulizer solution Take 2.5 mLs (0.5 mg total) by nebulization 4 (four) times daily as needed for wheezing or shortness of breath. 11/18/14  Yes Arnoldo Morale, MD  lisinopril-hydrochlorothiazide (PRINZIDE,ZESTORETIC) 10-12.5 MG per tablet Take 1 tablet by mouth daily. 11/18/14  Yes Arnoldo Morale, MD  mometasone-formoterol (DULERA) 100-5 MCG/ACT AERO Inhale 2 puffs into the lungs 2 (two) times daily. 07/13/15  Yes Deneise Lever, MD  nicotine (NICODERM  CQ - DOSED IN MG/24 HOURS) 14 mg/24hr patch Place 1 patch (14 mg total) onto the skin daily. 03/14/15  Yes Smiley Houseman, MD  omalizumab Arvid Right) 150 MG injection Inject 150 mg into the skin every 14 (fourteen) days.   Yes Historical Provider, MD  ranitidine (ZANTAC) 150 MG tablet Take 2 tablets (300 mg total) by mouth at bedtime. 11/03/14  Yes [name], MD  sertraline (ZOLOFT) 50 MG tablet Take 1 tablet (50 mg total) by mouth daily. 11/18/14  Yes Arnoldo Morale, MD  zolpidem (AMBIEN) 10 MG tablet Take 10 mg by mouth at bedtime as needed for sleep.   Yes Historical Provider, MD  predniSONE (DELTASONE) 20 MG tablet 2 tabs po daily x 4 days 07/22/15   Merrily Pew, MD   BP 165/88 mmHg  Pulse 95  Temp(Src) 98.2 F (36.8 C) (Oral)  Resp 22  Ht 5\' 6"  (1.676 m)  Wt 329 lb (149.233 kg)  BMI 53.13 kg/m2  SpO2 100%  LMP 07/18/2015 Physical Exam  Constitutional: She appears well-developed and well-nourished.  HENT:  Head: Normocephalic and atraumatic.  Neck: Normal range of motion.  Cardiovascular: Normal rate and regular rhythm.   Pulmonary/Chest: No stridor. No respiratory distress. She has wheezes.  Abdominal: She exhibits no distension.  Neurological: She is alert.  Nursing note and vitals reviewed.   ED Course  Procedures (including critical care time) Labs Review Labs Reviewed - No data to display  Imaging Review No results found. I have personally reviewed and evaluated these images and lab results as part of my medical decision-making.   EKG Interpretation None      MDM   Final diagnoses:  Asthma exacerbation   Likely asthma exacerbation, will give continuous albuterol with steroids and magnesium and reassess.   Reassessed near end of continuous albuterol and patient more comfortable, wheezign improved, BS still sound good. Will obs for approx 30-45 minutes after albuterol to ensure continued improvement. BP meds given with improvement in BP, suspect some of that  is 2/2 improved breathing ability.   On reevaluation patient symptoms significantly improved. Will continue albuterol as an outpatient, steroid burst as well. Will d/w pulmonologist re: new medications.   New Prescriptions: New Prescriptions   PREDNISONE (DELTASONE) 20 MG TABLET    2 tabs po daily x 4 days     I have personally and contemperaneously reviewed labs and imaging and used in my decision making as above.   A medical screening exam was performed and I feel the patient has had an appropriate workup for their chief complaint at this time and likelihood of emergent condition existing is low. Their vital signs are stable. They have been counseled on decision, discharge, follow up and which symptoms necessitate immediate return to the emergency department.  They verbally stated understanding and agreement with plan and discharged in stable condition.      Merrily Pew, MD 07/21/15 [phone]

## 2015-07-21 NOTE — ED Notes (Signed)
Pt verbalizes understanding of instructions. 

## 2015-07-28 ENCOUNTER — Ambulatory Visit (INDEPENDENT_AMBULATORY_CARE_PROVIDER_SITE_OTHER): Payer: Self-pay

## 2015-07-28 ENCOUNTER — Telehealth: Payer: Self-pay | Admitting: Internal Medicine

## 2015-07-28 DIAGNOSIS — J4540 Moderate persistent asthma, uncomplicated: Secondary | ICD-10-CM

## 2015-07-28 DIAGNOSIS — J4550 Severe persistent asthma, uncomplicated: Secondary | ICD-10-CM

## 2015-07-28 MED ORDER — PREDNISONE 10 MG PO TABS: ORAL_TABLET | ORAL | Status: DC

## 2015-07-28 MED FILL — predniSONE 10 MG TABS: 10 | 9 days supply | Qty: 20 | Fill #0

## 2015-07-28 NOTE — Telephone Encounter (Signed)
Spoke with pt. States that she is having an asthma flare up. Reports wheezing, SOB and chest tightness. Denies coughing or fever. Symptoms started this AM when she woke up. Would like an appointment or having something sent in.  No Known Allergies  Current Outpatient Prescriptions on File Prior to Visit  Medication Sig Dispense Refill  . albuterol (PROVENTIL HFA;VENTOLIN HFA) 108 (90 Base) MCG/ACT inhaler Inhale 1-2 puffs into the lungs every 4 (four) hours. (Patient taking differently: Inhale 1-2 puffs into the lungs every 4 (four) hours as needed for wheezing or shortness of breath. ) 1 Inhaler 5  . albuterol (PROVENTIL) (2.5 MG/3ML) 0.083% nebulizer solution Take 3 mLs (2.5 mg total) by nebulization every 6 (six) hours as needed for wheezing or shortness of breath. 150 mL 1  . eszopiclone (LUNESTA) 2 MG TABS tablet Take 1 tablet (2 mg total) by mouth at bedtime as needed for sleep. Take immediately before bedtime 30 tablet 5  . fexofenadine (ALLEGRA) 60 MG tablet Take 1 tablet (60 mg total) by mouth 2 (two) times daily. 30 tablet 0  . ipratropium (ATROVENT) 0.02 % nebulizer solution Take 2.5 mLs (0.5 mg total) by nebulization 4 (four) times daily as needed for wheezing or shortness of breath. 75 mL 2  . lisinopril-hydrochlorothiazide (PRINZIDE,ZESTORETIC) 10-12.5 MG per tablet Take 1 tablet by mouth daily. 30 tablet 2  . mometasone-formoterol (DULERA) 100-5 MCG/ACT AERO Inhale 2 puffs into the lungs 2 (two) times daily. 1 Inhaler 5  . nicotine (NICODERM CQ - DOSED IN MG/24 HOURS) 14 mg/24hr patch Place 1 patch (14 mg total) onto the skin daily. 28 patch 0  . omalizumab (XOLAIR) 150 MG injection Inject 150 mg into the skin every 14 (fourteen) days.    . predniSONE (DELTASONE) 20 MG tablet 2 tabs po daily x 4 days 8 tablet 0  . ranitidine (ZANTAC) 150 MG tablet Take 2 tablets (300 mg total) by mouth at bedtime. 180 tablet 3  . sertraline (ZOLOFT) 50 MG tablet Take 1 tablet (50 mg total) by  mouth daily. 30 tablet 1  . zolpidem (AMBIEN) 10 MG tablet Take 10 mg by mouth at bedtime as needed for sleep.     No current facility-administered medications on file prior to visit.    CY - please advise. Thanks.

## 2015-07-28 NOTE — Telephone Encounter (Signed)
Spoke with pt. She is aware of CY's recommendations. Rx has been sent in. Nothing further was needed. 

## 2015-07-28 NOTE — Telephone Encounter (Signed)
Offer prednisone 10 mg, #20   4 X 2 DAYS, 3 X 2 DAYS, 2 X 2 DAYS, 1 X 2 DAYS  

## 2015-07-31 MED ORDER — OMALIZUMAB 150 MG ~~LOC~~ SOLR
225.0000 mg | Freq: Once | SUBCUTANEOUS | Status: AC
  Administered 2015-07-28: 225 mg via SUBCUTANEOUS

## 2015-08-01 ENCOUNTER — Telehealth: Payer: Self-pay | Admitting: Internal Medicine

## 2015-08-01 NOTE — Discharge Summary (Signed)
Physician Discharge Summary  TAKYLAH [name] [id number] DOB: June 19, 1970 DOA: 07/12/2015  PCP: [name], MD  Admit date: 07/12/2015 Discharge date: 08/01/2015  Recommendations for Outpatient Follow-up:  1. Patient left AGAINST MEDICAL ADVICE outside of my work hours  Discharge Diagnoses:  Active Problems:   Acute asthma exacerbation   Essential hypertension   Leukocytosis   Tobacco abuse    Discharge Condition: stable   Diet recommendation: as tolerated   History of present illness:  Per HPI "45 year old female with past medical history significant for asthma, hypertension, tobacco use who presented to [facility] hospital with worsening shortness of breath over past few days and even worse in last 24 hours prior to this admission. Patient reports shortness of breath at rest as well as with exertion. She used her inhaler and nebulizer treatment more than every 2 hours a day with no significant symptomatic relief. No reports of fevers or chills. She does have intermittent nonproductive cough. She does have some chest pain but only with coughing. No palpitations. No reports of abdominal pain, nausea or vomiting. No diarrhea or constipation. No blood in the stool or urine. No urinary complaints.  In ED, patient was hemodynamically stable. Blood work was significant for leukocytosis of 17.5.chest x-ray showed no edema or consolidation.In ED, she was given nebulizer treatment DuoNeb 2 times and then separately albuterol and Atrovent one time in addition to prednisone 60 mg one-time dose with some improvement but she still short of breath and wheezing for which reason [facility] asked for the admission for management of acute asthma exacerbation."  Hospital Course:  Assessment & Plan    Active Problems:  Acute asthma exacerbation - We continued nebulizer treatments, Atrovent and Xopenex every 4 hours as scheduled as well as every 2 hours as needed for shortness of breath or wheezing -  Started Solu-Medrol 60 mg IV every 12 hours   Essential hypertension - Resumed home med, Prinzide   Leukocytosis - Likely from steroids - No evidence of acute cardiopulmonary process on chest x-ray   Depression - Continued sertraline   Tobacco abuse - Nicotine patch ordered  DVT prophylaxis:  - SCD's bilaterally      Signed:  Leisa Lenz, MD  [facility] 08/01/2015, 11:02 AM  Pager #: [phone]    Discharge Exam: Filed Vitals:   07/12/15 2012 07/12/15 2051  BP:  172/80  Pulse: 78 84  Temp:  98.8 F (37.1 C)  Resp: 18 20   Filed Vitals:   07/12/15 1601 07/12/15 1700 07/12/15 2012 07/12/15 2051  BP: 154/80   172/80  Pulse:   78 84  Temp:    98.8 F (37.1 C)  TempSrc:    Oral  Resp:   18 20  Height:  5\' 6"  (1.676 m)    Weight:  152 kg (335 lb 1.6 oz)    SpO2:   98% 100%    Discharge Instructions     Medication List    ASK your doctor about these medications        albuterol (2.5 MG/3ML) 0.083% nebulizer solution  Commonly known as:  PROVENTIL  Take 3 mLs (2.5 mg total) by nebulization every 6 (six) hours as needed for wheezing or shortness of breath.     eszopiclone 2 MG Tabs tablet  Commonly known as:  LUNESTA  Take 1 tablet (2 mg total) by mouth at bedtime as needed for sleep. Take immediately before bedtime     fexofenadine 60 MG tablet  Commonly known  as:  ALLEGRA  Take 1 tablet (60 mg total) by mouth 2 (two) times daily.     ipratropium 0.02 % nebulizer solution  Commonly known as:  ATROVENT  Take 2.5 mLs (0.5 mg total) by nebulization 4 (four) times daily as needed for wheezing or shortness of breath.     lisinopril-hydrochlorothiazide 10-12.5 MG tablet  Commonly known as:  PRINZIDE,ZESTORETIC  Take 1 tablet by mouth daily.     nicotine 14 mg/24hr patch  Commonly known as:  NICODERM CQ - dosed in mg/24 hours  Place 1 patch (14 mg total) onto the skin daily.     omalizumab 150 MG injection  Commonly known as:  XOLAIR   Inject 150 mg into the skin every 14 (fourteen) days.     ranitidine 150 MG tablet  Commonly known as:  ZANTAC  Take 2 tablets (300 mg total) by mouth at bedtime.     sertraline 50 MG tablet  Commonly known as:  ZOLOFT  Take 1 tablet (50 mg total) by mouth daily.     zolpidem 10 MG tablet  Commonly known as:  AMBIEN  Take 10 mg by mouth at bedtime as needed for sleep.          The results of significant diagnostics from this hospitalization (including imaging, microbiology, ancillary and laboratory) are listed below for reference.    Significant Diagnostic Studies: Dg Chest 2 View  07/12/2015  CLINICAL DATA:  Shortness of breath with cough and wheezing for 1 day EXAM: CHEST  2 VIEW COMPARISON:  May 25, 2015 FINDINGS: Lungs are clear. Heart size and pulmonary vascularity are normal. No adenopathy. No bone lesions. IMPRESSION: No edema or consolidation. Electronically Signed   By: Lowella Grip III M.D.   On: 07/12/2015 07:25    Microbiology: No results found for this or any previous visit (from the past 240 hour(s)).   Labs: Basic Metabolic Panel: No results for input(s): NA, K, CL, CO2, GLUCOSE, BUN, CREATININE, CALCIUM, MG, PHOS in the last 168 hours. Liver Function Tests: No results for input(s): AST, ALT, ALKPHOS, BILITOT, PROT, ALBUMIN in the last 168 hours. No results for input(s): LIPASE, AMYLASE in the last 168 hours. No results for input(s): AMMONIA in the last 168 hours. CBC: No results for input(s): WBC, NEUTROABS, HGB, HCT, MCV, PLT in the last 168 hours. Cardiac Enzymes: No results for input(s): CKTOTAL, CKMB, CKMBINDEX, TROPONINI in the last 168 hours. BNP: BNP (last 3 results) No results for input(s): BNP in the last 8760 hours.  ProBNP (last 3 results) No results for input(s): PROBNP in the last 8760 hours.  CBG: No results for input(s): GLUCAP in the last 168 hours.

## 2015-08-01 NOTE — Telephone Encounter (Signed)
LVM for pt to return call

## 2015-08-02 NOTE — Telephone Encounter (Signed)
Pt called and spoke with pt. She states she needs a refill request for her pred taper. I explained that she has not kept 3 appointments since 02/2015 and that I do not show her taking pred daily and that we do not just refill pred tapers. I explained to her that CY was out of the office today but would return tomorrow and that she needed to keep her ov on 08/17/15. She voiced understanding and had no further questions.

## 2015-08-03 ENCOUNTER — Telehealth: Payer: Self-pay | Admitting: Internal Medicine

## 2015-08-03 MED ORDER — PREDNISONE 10 MG PO TABS: 10.0000 mg | ORAL_TABLET | Freq: Every day | ORAL | Status: DC

## 2015-08-03 NOTE — Telephone Encounter (Signed)
Patient returned call, CB is [phone]

## 2015-08-03 NOTE — Telephone Encounter (Signed)
Recommend prednisone 10 mg # 30, 1 daily till office visit.

## 2015-08-03 NOTE — Telephone Encounter (Signed)
Spoke with pt and gave recommendations. She would like rx sent to [facility] on [address]. Rx sent. Nothing further needed.

## 2015-08-03 NOTE — Telephone Encounter (Signed)
Spoke with pt.  She finished pred taper 1 day ago and has already started with increased sob and wheezing.  She is requesting a refill on pred taper until her appt with CY on 08/17/15.  She had been off Xolair for a while but just restarted this last week.  She is using Albuterol more frequently. She reports she is taking Dulera as directed.  Please advise.  No Known Allergies  Current Outpatient Prescriptions on File Prior to Visit  Medication Sig Dispense Refill  . albuterol (PROVENTIL HFA;VENTOLIN HFA) 108 (90 Base) MCG/ACT inhaler Inhale 1-2 puffs into the lungs every 4 (four) hours. (Patient taking differently: Inhale 1-2 puffs into the lungs every 4 (four) hours as needed for wheezing or shortness of breath. ) 1 Inhaler 5  . albuterol (PROVENTIL) (2.5 MG/3ML) 0.083% nebulizer solution Take 3 mLs (2.5 mg total) by nebulization every 6 (six) hours as needed for wheezing or shortness of breath. 150 mL 1  . eszopiclone (LUNESTA) 2 MG TABS tablet Take 1 tablet (2 mg total) by mouth at bedtime as needed for sleep. Take immediately before bedtime 30 tablet 5  . fexofenadine (ALLEGRA) 60 MG tablet Take 1 tablet (60 mg total) by mouth 2 (two) times daily. 30 tablet 0  . ipratropium (ATROVENT) 0.02 % nebulizer solution Take 2.5 mLs (0.5 mg total) by nebulization 4 (four) times daily as needed for wheezing or shortness of breath. 75 mL 2  . lisinopril-hydrochlorothiazide (PRINZIDE,ZESTORETIC) 10-12.5 MG per tablet Take 1 tablet by mouth daily. 30 tablet 2  . mometasone-formoterol (DULERA) 100-5 MCG/ACT AERO Inhale 2 puffs into the lungs 2 (two) times daily. 1 Inhaler 5  . nicotine (NICODERM CQ - DOSED IN MG/24 HOURS) 14 mg/24hr patch Place 1 patch (14 mg total) onto the skin daily. 28 patch 0  . omalizumab (XOLAIR) 150 MG injection Inject 150 mg into the skin every 14 (fourteen) days.    . predniSONE (DELTASONE) 10 MG tablet 4 X 2 DAYS, 3 X 2 DAYS, 2 X 2 DAYS, 1 X 2 DAYS 20 tablet 0  . ranitidine (ZANTAC)  150 MG tablet Take 2 tablets (300 mg total) by mouth at bedtime. 180 tablet 3  . sertraline (ZOLOFT) 50 MG tablet Take 1 tablet (50 mg total) by mouth daily. 30 tablet 1  . zolpidem (AMBIEN) 10 MG tablet Take 10 mg by mouth at bedtime as needed for sleep.     No current facility-administered medications on file prior to visit.

## 2015-08-03 NOTE — Telephone Encounter (Signed)
Pt returning call.April [name] ° °

## 2015-08-03 NOTE — Telephone Encounter (Signed)
LMTCB for pt 

## 2015-08-03 NOTE — Telephone Encounter (Signed)
LMTCB

## 2015-08-09 ENCOUNTER — Telehealth: Payer: Self-pay | Admitting: Internal Medicine

## 2015-08-09 NOTE — Telephone Encounter (Signed)
#   vials:4 Ordered date:08/09/15 Shipping Date:08/15/15

## 2015-08-14 ENCOUNTER — Encounter (HOSPITAL_COMMUNITY): Payer: Self-pay | Admitting: *Deleted

## 2015-08-14 ENCOUNTER — Observation Stay (HOSPITAL_COMMUNITY)
Admission: EM | Admit: 2015-08-14 | Discharge: 2015-08-15 | Disposition: A | Discharge status: Home / Self Care | Payer: Self-pay | Attending: Internal Medicine | Admitting: Internal Medicine

## 2015-08-14 ENCOUNTER — Emergency Department (HOSPITAL_COMMUNITY): Payer: MEDICAID

## 2015-08-14 DIAGNOSIS — F329 Major depressive disorder, single episode, unspecified: Secondary | ICD-10-CM | POA: Insufficient documentation

## 2015-08-14 DIAGNOSIS — K219 Gastro-esophageal reflux disease without esophagitis: Secondary | ICD-10-CM | POA: Insufficient documentation

## 2015-08-14 DIAGNOSIS — L52 Erythema nodosum: Secondary | ICD-10-CM | POA: Insufficient documentation

## 2015-08-14 DIAGNOSIS — Z7952 Long term (current) use of systemic steroids: Secondary | ICD-10-CM | POA: Insufficient documentation

## 2015-08-14 DIAGNOSIS — Z7951 Long term (current) use of inhaled steroids: Secondary | ICD-10-CM

## 2015-08-14 DIAGNOSIS — J45901 Unspecified asthma with (acute) exacerbation: Secondary | ICD-10-CM | POA: Diagnosis present

## 2015-08-14 DIAGNOSIS — Z87891 Personal history of nicotine dependence: Secondary | ICD-10-CM | POA: Insufficient documentation

## 2015-08-14 DIAGNOSIS — D72829 Elevated white blood cell count, unspecified: Secondary | ICD-10-CM | POA: Insufficient documentation

## 2015-08-14 DIAGNOSIS — J4551 Severe persistent asthma with (acute) exacerbation: Secondary | ICD-10-CM

## 2015-08-14 DIAGNOSIS — J302 Other seasonal allergic rhinitis: Secondary | ICD-10-CM | POA: Insufficient documentation

## 2015-08-14 DIAGNOSIS — J4550 Severe persistent asthma, uncomplicated: Principal | ICD-10-CM | POA: Insufficient documentation

## 2015-08-14 DIAGNOSIS — I10 Essential (primary) hypertension: Secondary | ICD-10-CM | POA: Insufficient documentation

## 2015-08-14 LAB — BASIC METABOLIC PANEL WITH GFR
Anion gap: 8 (ref 5–15)
BUN: 9 mg/dL (ref 6–20)
CO2: 25 mmol/L (ref 22–32)
Calcium: 8 mg/dL — ABNORMAL LOW (ref 8.9–10.3)
Chloride: 106 mmol/L (ref 101–111)
Creatinine, Ser: 0.8 mg/dL (ref 0.44–1.00)
GFR calc Af Amer: >60 mL/min
GFR calc non Af Amer: >60 mL/min
Glucose, Bld: 86 mg/dL (ref 65–99)
Potassium: 3.6 mmol/L (ref 3.5–5.1)
Sodium: 139 mmol/L (ref 135–145)

## 2015-08-14 LAB — CBC WITH DIFFERENTIAL/PLATELET
Basophils Absolute: 0 K/uL (ref 0.0–0.1)
Basophils Relative: 0 %
Eosinophils Absolute: 0.2 K/uL (ref 0.0–0.7)
Eosinophils Relative: 2 %
HCT: 37.5 % (ref 36.0–46.0)
Hemoglobin: 11.9 g/dL — ABNORMAL LOW (ref 12.0–15.0)
Lymphocytes Relative: 23 %
Lymphs Abs: 3.5 K/uL (ref 0.7–4.0)
MCH: 28.3 pg (ref 26.0–34.0)
MCHC: 31.7 g/dL (ref 30.0–36.0)
MCV: 89.1 fL (ref 78.0–100.0)
Monocytes Absolute: 1.7 K/uL — ABNORMAL HIGH (ref 0.1–1.0)
Monocytes Relative: 11 %
Neutro Abs: 10 K/uL — ABNORMAL HIGH (ref 1.7–7.7)
Neutrophils Relative %: 64 %
Platelets: 345 K/uL (ref 150–400)
RBC: 4.21 MIL/uL (ref 3.87–5.11)
RDW: 16.8 % — ABNORMAL HIGH (ref 11.5–15.5)
WBC: 15.4 K/uL — ABNORMAL HIGH (ref 4.0–10.5)

## 2015-08-14 MED ORDER — LISINOPRIL 10 MG PO TABS
10.0000 mg | ORAL_TABLET | Freq: Every day | ORAL | Status: DC
  Administered 2015-08-14 – 2015-08-15 (×2): 10 mg via ORAL
  Filled 2015-08-14 (×2): qty 1

## 2015-08-14 MED ORDER — ALBUTEROL (5 MG/ML) CONTINUOUS INHALATION SOLN
10.0000 mg/h | INHALATION_SOLUTION | Freq: Once | RESPIRATORY_TRACT | Status: AC
  Administered 2015-08-14: 10 mg/h via RESPIRATORY_TRACT
  Filled 2015-08-14: qty 20

## 2015-08-14 MED ORDER — MAGNESIUM SULFATE 2 GM/50ML IV SOLN
2.0000 g | Freq: Once | INTRAVENOUS | Status: AC
  Administered 2015-08-14: 2 g via INTRAVENOUS
  Filled 2015-08-14: qty 50

## 2015-08-14 MED ORDER — ALBUTEROL SULFATE (2.5 MG/3ML) 0.083% IN NEBU
5.0000 mg | INHALATION_SOLUTION | Freq: Once | RESPIRATORY_TRACT | Status: AC
  Administered 2015-08-14: 5 mg via RESPIRATORY_TRACT

## 2015-08-14 MED ORDER — ALBUTEROL SULFATE (2.5 MG/3ML) 0.083% IN NEBU
INHALATION_SOLUTION | RESPIRATORY_TRACT | Status: AC
  Filled 2015-08-14: qty 6

## 2015-08-14 MED ORDER — MOMETASONE FURO-FORMOTEROL FUM 100-5 MCG/ACT IN AERO
2.0000 | INHALATION_SPRAY | Freq: Two times a day (BID) | RESPIRATORY_TRACT | Status: DC
  Administered 2015-08-14 – 2015-08-15 (×2): 2 via RESPIRATORY_TRACT
  Filled 2015-08-14: qty 8.8

## 2015-08-14 MED ORDER — ALBUTEROL SULFATE (2.5 MG/3ML) 0.083% IN NEBU
2.5000 mg | INHALATION_SOLUTION | Freq: Three times a day (TID) | RESPIRATORY_TRACT | Status: DC
  Filled 2015-08-14: qty 3

## 2015-08-14 MED ORDER — LORATADINE 10 MG PO TABS
10.0000 mg | ORAL_TABLET | Freq: Every day | ORAL | Status: DC
  Administered 2015-08-14 – 2015-08-15 (×2): 10 mg via ORAL
  Filled 2015-08-14 (×2): qty 1

## 2015-08-14 MED ORDER — PREDNISONE 10 MG PO TABS
60.0000 mg | ORAL_TABLET | Freq: Every day | ORAL | Status: DC
  Administered 2015-08-15: 60 mg via ORAL
  Filled 2015-08-14: qty 1

## 2015-08-14 MED ORDER — ALBUTEROL SULFATE (2.5 MG/3ML) 0.083% IN NEBU
2.5000 mg | INHALATION_SOLUTION | Freq: Four times a day (QID) | RESPIRATORY_TRACT | Status: DC
  Administered 2015-08-14: 2.5 mg via RESPIRATORY_TRACT
  Filled 2015-08-14: qty 3

## 2015-08-14 MED ORDER — FAMOTIDINE 20 MG PO TABS
20.0000 mg | ORAL_TABLET | Freq: Every day | ORAL | Status: DC
  Administered 2015-08-14 – 2015-08-15 (×2): 20 mg via ORAL
  Filled 2015-08-14 (×2): qty 1

## 2015-08-14 MED ORDER — LISINOPRIL-HYDROCHLOROTHIAZIDE 10-12.5 MG PO TABS: 1.0000 | ORAL_TABLET | Freq: Every day | ORAL | Status: DC

## 2015-08-14 MED ORDER — METHYLPREDNISOLONE SODIUM SUCC 125 MG IJ SOLR
125.0000 mg | Freq: Once | INTRAMUSCULAR | Status: AC
  Administered 2015-08-14: 125 mg via INTRAVENOUS
  Filled 2015-08-14: qty 2

## 2015-08-14 MED ORDER — NICOTINE 14 MG/24HR TD PT24
14.0000 mg | MEDICATED_PATCH | Freq: Every day | TRANSDERMAL | Status: DC
  Administered 2015-08-14 – 2015-08-15 (×2): 14 mg via TRANSDERMAL
  Filled 2015-08-14 (×3): qty 1

## 2015-08-14 MED ORDER — RAMELTEON 8 MG PO TABS
8.0000 mg | ORAL_TABLET | Freq: Every day | ORAL | Status: DC
  Administered 2015-08-14: 8 mg via ORAL
  Filled 2015-08-14: qty 1

## 2015-08-14 MED ORDER — IPRATROPIUM-ALBUTEROL 0.5-2.5 (3) MG/3ML IN SOLN
3.0000 mL | Freq: Once | RESPIRATORY_TRACT | Status: AC
  Administered 2015-08-14: 3 mL via RESPIRATORY_TRACT
  Filled 2015-08-14: qty 3

## 2015-08-14 MED ORDER — LORATADINE 10 MG PO TABS: 10.0000 mg | ORAL_TABLET | Freq: Every day | ORAL | Status: DC

## 2015-08-14 MED ORDER — SERTRALINE HCL 50 MG PO TABS
50.0000 mg | ORAL_TABLET | Freq: Every day | ORAL | Status: DC
  Administered 2015-08-14 – 2015-08-15 (×2): 50 mg via ORAL
  Filled 2015-08-14 (×2): qty 1

## 2015-08-14 MED ORDER — HYDROCHLOROTHIAZIDE 12.5 MG PO CAPS
12.5000 mg | ORAL_CAPSULE | Freq: Every day | ORAL | Status: DC
  Administered 2015-08-14 – 2015-08-15 (×2): 12.5 mg via ORAL
  Filled 2015-08-14 (×2): qty 1

## 2015-08-14 MED ORDER — ENOXAPARIN SODIUM 80 MG/0.8ML ~~LOC~~ SOLN
70.0000 mg | SUBCUTANEOUS | Status: DC
  Administered 2015-08-14: 70 mg via SUBCUTANEOUS
  Filled 2015-08-14: qty 0.8

## 2015-08-14 NOTE — Progress Notes (Signed)
Pharmacy: Lovenox Dose Adjustment for VTE prophylaxis  OBJECTIVE:  Wt: 140.6 kg , Ht: 66 inches SCr 0.8  ASSESSMENT:  44 YOF on lovenox for VTE prophylaxis with estimated BMI~50 and CrCl>100 ml/min requiring a lovenox dose adjustment.  PLAN:  1. Adjust Lovenox to 70 mg (~0.5 mg/kg) every 24 hours 2. Pharmacy will monitor peripherally for any other necessary dose adjustments  Alycia Rossetti, PharmD, BCPS Clinical Pharmacist Pager: [phone] 08/14/2015 4:55 PM

## 2015-08-14 NOTE — ED Provider Notes (Signed)
CSN: [id number]     Arrival date & time 08/14/15  1123 History   First MD Initiated Contact with Patient 08/14/15 1252     Chief Complaint  Patient presents with  . wheezes   . Asthma     (Consider location/radiation/quality/duration/timing/severity/associated sxs/prior Treatment) HPI  April [name] is a 45 year old female with history of asthma, HTN, COPD who presents to the emergency department today complaining of shortness of breath. Patient states that her asthma began acting up 2 days ago. She is experiencing significant wheezing and worsening shortness of breath. Her shortness of breath is worsened with exertion and with lying flat. She has tried home albuterol nebulizers, inhaler, Dulera without relief. She denies fevers, chills, cough, chest pain, dizziness or syncope. She was in the ED 2 weeks ago with similar symptoms and was sent home on prednisone which she took all of your chest states her symptoms improved at the time but came back 2 days ago. She has an appointment with the pulmonologist on April 20 for follow-up. Patient quit smoking 2 years ago.  Past Medical History  Diagnosis Date  . Acanthosis nigricans   . Menorrhagia   . Hypertension, essential   . Insomnia   . Morbid obesity ([place])   . Helicobacter pylori (H. pylori) infection   . Sleep apnea     Sleep study 2008 - mild OSA, not enough events to titrate CPAP  . COPD (chronic obstructive pulmonary disease) ([place])     PFTs in 2002, FEV1/FVC 65, no post bronchodilater test done  . Asthma     Followed by Dr. Annamaria Boots (pulmonology); receives every other week omalizumab injections; has frequent exacerbations  . Depression   . GERD (gastroesophageal reflux disease)   . Tobacco user   . Obesity   . Shortness of breath   . Headache(784.0)   . Seasonal allergies   . Arthritis    Past Surgical History  Procedure Laterality Date  . Tubal ligation  1996    bilateral  . Breast reduction surgery  09/2011   Family  History  Problem Relation Age of Onset  . Asthma Daughter   . Cancer Paternal Aunt   . Asthma Maternal Grandmother   . Hypertension Mother    Social History  Substance Use Topics  . Smoking status: Former Smoker -- 18 years    Types: Cigarettes    Quit date: 09/15/2014  . Smokeless tobacco: Former Systems developer    Quit date: 09/12/2014  . Alcohol Use: No   OB History    No data available     Review of Systems  All other systems reviewed and are negative.     Allergies  Review of patient's allergies indicates no known allergies.  Home Medications   Prior to Admission medications   Medication Sig Start Date End Date Taking? Authorizing Provider  albuterol (PROVENTIL HFA;VENTOLIN HFA) 108 (90 Base) MCG/ACT inhaler Inhale 1-2 puffs into the lungs every 4 (four) hours. Patient taking differently: Inhale 1-2 puffs into the lungs every 4 (four) hours as needed for wheezing or shortness of breath.  07/13/15   Deneise Lever, MD  albuterol (PROVENTIL) (2.5 MG/3ML) 0.083% nebulizer solution Take 3 mLs (2.5 mg total) by nebulization every 6 (six) hours as needed for wheezing or shortness of breath. 11/18/14   Arnoldo Morale, MD  eszopiclone (LUNESTA) 2 MG TABS tablet Take 1 tablet (2 mg total) by mouth at bedtime as needed for sleep. Take immediately before bedtime 03/20/15   [place]  Lucia Estelle, MD  fexofenadine (ALLEGRA) 60 MG tablet Take 1 tablet (60 mg total) by mouth 2 (two) times daily. 02/08/15   Tatyana Kirichenko, PA-C  ipratropium (ATROVENT) 0.02 % nebulizer solution Take 2.5 mLs (0.5 mg total) by nebulization 4 (four) times daily as needed for wheezing or shortness of breath. 11/18/14   Arnoldo Morale, MD  lisinopril-hydrochlorothiazide (PRINZIDE,ZESTORETIC) 10-12.5 MG per tablet Take 1 tablet by mouth daily. 11/18/14   Arnoldo Morale, MD  mometasone-formoterol (DULERA) 100-5 MCG/ACT AERO Inhale 2 puffs into the lungs 2 (two) times daily. 07/13/15   Deneise Lever, MD  nicotine (NICODERM CQ -  DOSED IN MG/24 HOURS) 14 mg/24hr patch Place 1 patch (14 mg total) onto the skin daily. 03/14/15   Smiley Houseman, MD  omalizumab Arvid Right) 150 MG injection Inject 150 mg into the skin every 14 (fourteen) days.    Historical Provider, MD  predniSONE (DELTASONE) 10 MG tablet Take 1 tablet (10 mg total) by mouth daily with breakfast. 08/03/15   Deneise Lever, MD  ranitidine (ZANTAC) 150 MG tablet Take 2 tablets (300 mg total) by mouth at bedtime. 11/03/14   [name], MD  sertraline (ZOLOFT) 50 MG tablet Take 1 tablet (50 mg total) by mouth daily. 11/18/14   Arnoldo Morale, MD  zolpidem (AMBIEN) 10 MG tablet Take 10 mg by mouth at bedtime as needed for sleep.    Historical Provider, MD   BP 157/98 mmHg  Pulse 90  Temp(Src) 98.7 F (37.1 C) (Oral)  Resp 22  Ht 5\' 6"  (1.676 m)  Wt 140.615 kg  BMI 50.06 kg/m2  SpO2 98%  LMP 07/18/2015 Physical Exam  Constitutional: She is oriented to person, place, and time. She appears well-developed and well-nourished. No distress.  HENT:  Head: Normocephalic and atraumatic.  Mouth/Throat: No oropharyngeal exudate.  Eyes: Conjunctivae and EOM are normal. Pupils are equal, round, and reactive to light. Right eye exhibits no discharge. Left eye exhibits no discharge. No scleral icterus.  Cardiovascular: Normal rate, regular rhythm, normal heart sounds and intact distal pulses.  Exam reveals no gallop and no friction rub.   No murmur heard. Pulmonary/Chest: Effort normal. No respiratory distress. She has wheezes. She has no rales. She exhibits no tenderness.  Inspiratory and Expiratory wheezes throughout with accessory muscle usage.  Abdominal: Soft. She exhibits no distension. There is no tenderness. There is no guarding.  Musculoskeletal: Normal range of motion. She exhibits no edema.  Neurological: She is alert and oriented to person, place, and time.  Skin: Skin is warm and dry. No rash noted. She is not diaphoretic. No erythema. No pallor.   Psychiatric: She has a normal mood and affect. Her behavior is normal.  Nursing note and vitals reviewed.   ED Course  Procedures (including critical care time) Labs Review Labs Reviewed  BASIC METABOLIC PANEL - Abnormal; Notable for the following:    Calcium 8.0 (*)    All other components within normal limits  CBC WITH DIFFERENTIAL/PLATELET - Abnormal; Notable for the following:    WBC 15.4 (*)    Hemoglobin 11.9 (*)    RDW 16.8 (*)    Neutro Abs 10.0 (*)    Monocytes Absolute 1.7 (*)    All other components within normal limits  HEMOGLOBIN A1C    Imaging Review Dg Chest 2 View  08/14/2015  CLINICAL DATA:  Wheezing, cough, shortness of breath EXAM: CHEST  2 VIEW COMPARISON:  07/12/2015 FINDINGS: The heart size and mediastinal contours are within  normal limits. Both lungs are clear. The visualized skeletal structures are unremarkable. IMPRESSION: No active cardiopulmonary disease. Electronically Signed   By: Kathreen Devoid   On: 08/14/2015 13:47   I have personally reviewed and evaluated these images and lab results as part of my medical decision-making.   EKG Interpretation None      MDM   Final diagnoses:  Asthma exacerbation    45 year old female presents to the ED with acute asthma exacerbation. Diffuse inspiratory and expiratory wheezes in all lung fields. Patient is experiencing labored breathing and is unable to speak in complete sentences. However, no hypoxia present. She was given 2 DuoNeb's and 125 mg Solu-Medrol without relief. Chest x-ray negative. Leukocytosis present at 15.4. This is likely due to recent steroid use. No sign of infection. Patient given continuous nebulizer as well as 2 g of magnesium. Patient still experiencing severe difficulty breathing. Plan to admit to hospitalist for acute asthma exacerbation.      Dondra Spry Odell, PA-C 08/14/15 1639  Tanna Furry, MD 08/22/15 [phone]

## 2015-08-14 NOTE — Progress Notes (Signed)
New Admission Note:  Arrival Method: ED wheelchair Mental Orientation: alert and oriented Telemetry: placed on patient Assessment: Completed Skin: intact IV: left Ac Pain: none Tubes: none Safety Measures: Safety Fall Prevention Plan was given, discussed and signed. Admission: Completed [facility] [place] Orientation: Patient has been orientated to the room, unit and the staff. Family: not present  Orders have been reviewed and implemented. Will continue to monitor the patient. Call light has been placed within reach and bed alarm has been activated.   Fabian Sharp, RN  Phone Number: [phone]

## 2015-08-14 NOTE — ED Notes (Signed)
Pt to ED with wheezing started 2 days ago-- unable to speak in complete sentences, hx asthma-- using inhalers without relief. Lung sounds diminished throughout -- tight sounding, quiet wheezes.

## 2015-08-14 NOTE — H&P (Signed)
Date: 08/14/2015               Patient Name:  April [name] MRN: [id number]  DOB: 15-Jul-1970 Age / Sex: 45 y.o., female   PCP: [name], MD         Medical Service: Internal Medicine Teaching Service         Attending Physician: Dr. Aldine Contes, MD    First Contact: Dr. [name] Pager: [phone]  Second Contact: Dr. [name] Pager: [phone]       After Hours (After 5p/  First Contact Pager: [phone]  weekends / holidays): Second Contact Pager: [phone]   Chief Complaint: "I've been wheezing."  History of Present Illness:  April [name] is a 45 year old African-American lady with history of severe persistent asthma, morbid obesity, gastroesophageal reflux disease, and major depressive disorder, presenting with wheezing and shortness of breath for the last 3 days.  She is followed by pulmonologist Dr. Annamaria Boots, and has been taking 10-20 mg prednisone daily for the last few years. She is also on LABA/ICS daily, albuterol MDI as needed, and inhaled ipratropium as needed. She had been getting omalizumab injections for the last 2 weeks. She is been hospitalized about 5 times per year since she was a child, but has never been intubated.   Three days ago, she started sneezing and getting itchy eyes from the pollen. Her allergies flared despite using her albuterol up to 20 times a day. She's been taking prednisone 10mg  daily as well as her maintenance dose. She felt her wheezing get progressively worse so she decided to come in today.  She used to smoke but quit one year ago. She does have significant gastroesophageal reflux disease. One of her neighbors has a pet. She also has hypertension, but does not have any other medical problems.  She denies any fevers, productive cough, chest pain, leg swelling, history of DVT or blood clots in the family. Review of systems was otherwise nonrevealing.  Meds: Current Facility-Administered Medications  Medication Dose Route Frequency Provider Last  Rate Last Dose  . albuterol (PROVENTIL) (2.5 MG/3ML) 0.083% nebulizer solution           . magnesium sulfate IVPB 2 g 50 mL  2 g Intravenous Once [employer], PA-C 50 mL/hr at 08/14/15 1457 2 g at 08/14/15 1457   Current Outpatient Prescriptions  Medication Sig Dispense Refill  . albuterol (PROVENTIL HFA;VENTOLIN HFA) 108 (90 Base) MCG/ACT inhaler Inhale 1-2 puffs into the lungs every 4 (four) hours. (Patient taking differently: Inhale 1-2 puffs into the lungs every 4 (four) hours as needed for wheezing or shortness of breath. ) 1 Inhaler 5  . albuterol (PROVENTIL) (2.5 MG/3ML) 0.083% nebulizer solution Take 3 mLs (2.5 mg total) by nebulization every 6 (six) hours as needed for wheezing or shortness of breath. 150 mL 1  . eszopiclone (LUNESTA) 2 MG TABS tablet Take 1 tablet (2 mg total) by mouth at bedtime as needed for sleep. Take immediately before bedtime 30 tablet 5  . fexofenadine (ALLEGRA) 60 MG tablet Take 1 tablet (60 mg total) by mouth 2 (two) times daily. 30 tablet 0  . ipratropium (ATROVENT) 0.02 % nebulizer solution Take 2.5 mLs (0.5 mg total) by nebulization 4 (four) times daily as needed for wheezing or shortness of breath. 75 mL 2  . lisinopril-hydrochlorothiazide (PRINZIDE,ZESTORETIC) 10-12.5 MG per tablet Take 1 tablet by mouth daily. 30 tablet 2  . mometasone-formoterol (DULERA) 100-5 MCG/ACT AERO Inhale 2 puffs into the lungs  2 (two) times daily. 1 Inhaler 5  . nicotine (NICODERM CQ - DOSED IN MG/24 HOURS) 14 mg/24hr patch Place 1 patch (14 mg total) onto the skin daily. 28 patch 0  . omalizumab (XOLAIR) 150 MG injection Inject 150 mg into the skin every 14 (fourteen) days.    . predniSONE (DELTASONE) 10 MG tablet Take 1 tablet (10 mg total) by mouth daily with breakfast. 30 tablet 0  . ranitidine (ZANTAC) 150 MG tablet Take 2 tablets (300 mg total) by mouth at bedtime. 180 tablet 3  . sertraline (ZOLOFT) 50 MG tablet Take 1 tablet (50 mg total) by mouth daily. 30  tablet 1  . zolpidem (AMBIEN) 10 MG tablet Take 10 mg by mouth at bedtime as needed for sleep.     Allergies: Allergies as of 08/14/2015  . (No Known Allergies)   Past Medical History  Diagnosis Date  . Acanthosis nigricans   . Menorrhagia   . Hypertension, essential   . Insomnia   . Morbid obesity ([place])   . Helicobacter pylori (H. pylori) infection   . Sleep apnea     Sleep study 2008 - mild OSA, not enough events to titrate CPAP  . COPD (chronic obstructive pulmonary disease) ([place])     PFTs in 2002, FEV1/FVC 65, no post bronchodilater test done  . Asthma     Followed by Dr. Annamaria Boots (pulmonology); receives every other week omalizumab injections; has frequent exacerbations  . Depression   . GERD (gastroesophageal reflux disease)   . Tobacco user   . Obesity   . Shortness of breath   . Headache(784.0)   . Seasonal allergies   . Arthritis    Past Surgical History  Procedure Laterality Date  . Tubal ligation  1996    bilateral  . Breast reduction surgery  09/2011   Family History  Problem Relation Age of Onset  . Asthma Daughter   . Cancer Paternal Aunt   . Asthma Maternal Grandmother   . Hypertension Mother    Social History   Social History  . Marital Status: Single    Spouse Name: N/A  . Number of Children: 1  . 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 Smoking status: Former Smoker -- 18 years    Types: Cigarettes    Quit date: 09/15/2014  . Smokeless tobacco: Former Systems developer    Quit date: 09/12/2014  . Alcohol Use: No  . Drug Use: No  . Sexual Activity: Not on file   Other Topics Concern  . 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    Review of Systems: Per HPI  Physical Exam: Blood pressure 176/102, pulse 85, temperature 98.7 F (37.1 C), temperature  source Oral, resp. rate 24, height 5\' 6"  (1.676 m), weight 310 lb (140.615 kg), last menstrual period 07/18/2015, SpO2 100 %.   General: friendly obese black lady resting in bed comfortably, appropriately conversational HEENT: no scleral icterus, extra-ocular muscles intact, oropharynx without lesions Cardiac: regular rate and rhythm, no rubs, murmurs or gallops Pulm: breathing well, diffuse end-expiratory wheezes throughout Abd: obese, bowel sounds normal, soft, nondistended, non-tender Ext: warm and well perfused, without pedal edema Lymph: no cervical or supraclavicular lymphadenopathy Skin: tender ecchymoses on bilateral calves Neuro: alert and oriented X3, cranial  nerves II-XII grossly intact, moving all extremities well  Lab results: Basic Metabolic Panel:  Recent Labs  08/14/15 1420  NA 139  K 3.6  CL 106  CO2 25  GLUCOSE 86  BUN 9  CREATININE 0.80  CALCIUM 8.0*   CBC:  Recent Labs  08/14/15 1420  WBC 15.4*  NEUTROABS 10.0*  HGB 11.9*  HCT 37.5  MCV 89.1  PLT 345   Imaging results:  Dg Chest 2 View  08/14/2015  CLINICAL DATA:  Wheezing, cough, shortness of breath EXAM: CHEST  2 VIEW COMPARISON:  07/12/2015 FINDINGS: The heart size and mediastinal contours are within normal limits. Both lungs are clear. The visualized skeletal structures are unremarkable. IMPRESSION: No active cardiopulmonary disease. Electronically Signed   By: Kathreen Devoid   On: 08/14/2015 13:47    Other results: EKG: sinus tachycardia without ischemic changes  Assessment & Plan by Problem:  Ms. Contreras is a friendly 45 year old lady with severe persistent asthma here for an asthma exacerbation triggered by seasonal allergies.  Asthma exacerbation: She's oxygenating well on room air, without increased work of breathing. We will increase her home prednisone to 60mg  daily for 3 days and continue scheduled albuterol nebulizers. Upon discharge, we will send her out on a 2 week taper as this has  worked well for her in the past. She has a follow up appointment with Dr. Annamaria Boots on 4/20. -Albuterol nebulizer every 6 hours -Mometasone-formetorol 2 puffs twice daily -Prednisone 60mg  daily; will discharge on taper plan -Started loratadine 10mg  daily -Checking hemoglobin a1c as she is on chronic steroids -She will follow-up with her Pulmonologist, Dr. Annamaria Boots, on 4/20  Hypertension: Pressures up to 180s as she did not take her home medications. -Resume home lisinopril 10mg  -Resume HCTZ 12.5mg  daily  Erythema nodosum: She denies risk factors or symptoms concerning for tuberculosis, skin lesions nor chest x-ray findings concerning for sarcoidosis, is not on oral contraceptives, does not complain of abdominal pain concerning for pancreatitis, and does not have liver enzyme abnormalities consistent with a1-antitrypsin deficiency.  Leukocytosis: Probably from chronic prednisone. No signs of pneumonia or other infection.  Tobacco use: She quit smoking 3 years ago but is requesting a nicotine patch for her urges. -Nicotine patch 14mg  daily as needed  Depression: Her affect is normal. -Continue sertraline 50mg  daily  Dispo: Disposition is deferred at this time, awaiting improvement of current medical problems. Anticipated discharge in approximately 1 day(s).   The patient does have a current PCP (Tasrif Ahmed, MD) and does need an [facility] hospital follow-up appointment after discharge.  The patient does have transportation limitations that hinder transportation to clinic appointments.  Signed: [name], MD 08/14/2015, 3:46 PM

## 2015-08-14 NOTE — Progress Notes (Signed)
April [name] is a 45 y.o. female patient admitted from ED awake, alert - oriented  X 4.  VSS - Blood pressure 154/130, pulse 104, temperature 98.2 F (36.8 C), temperature source Oral, resp. rate 20, height 5\' 6"  (1.676 m), weight 152.136 kg (335 lb 6.4 oz), last menstrual period 07/18/2015, SpO2 100 %.    IV in place, occlusive dsg intact without redness.  Orientation to room, and floor completed with information packet given to patient/family.  Patient declined safety video at this time.  Admission INP armband ID verified with patient.    Fall assessment complete, with patient able to verbalize understanding of risk associated with falls. Call light within reach, patient able to voice, and demonstrate understanding.  Skin, clean-dry- intact without evidence of bruising, or skin tears.   No evidence of skin break down noted on exam.     Will cont to eval and treat per MD orders.  Jerry Caras, RN 08/14/2015 6:41 PM

## 2015-08-14 NOTE — ED Notes (Signed)
Resp aware of need for hour long neb

## 2015-08-14 NOTE — Progress Notes (Signed)
Notified Dr. Posey Pronto that pt's BP is 174/101. MD gave no new orders, stated we would just monitor it. Will continue to monitor pt. Ranelle Oyster, RN

## 2015-08-14 NOTE — Discharge Summary (Signed)
Name: April [name] MRN: [id number] DOB: Oct 30, 1970 45 y.o. PCP: [name], MD  Date of Admission: 08/14/2015 12:48 PM Date of Discharge: 08/15/2015 Attending Physician: Aldine Contes, MD  Discharge Diagnosis: 1. Asthma exacerbation  Discharge Medications:   Medication List    STOP taking these medications        fexofenadine 60 MG tablet  Commonly known as:  ALLEGRA  Replaced by:  loratadine 10 MG tablet      TAKE these medications        albuterol (2.5 MG/3ML) 0.083% nebulizer solution  Commonly known as:  PROVENTIL  Take 3 mLs (2.5 mg total) by nebulization every 6 (six) hours as needed for wheezing or shortness of breath.     albuterol 108 (90 Base) MCG/ACT inhaler  Commonly known as:  PROVENTIL HFA;VENTOLIN HFA  Inhale 1-2 puffs into the lungs every 4 (four) hours.     eszopiclone 2 MG Tabs tablet  Commonly known as:  LUNESTA  Take 1 tablet (2 mg total) by mouth at bedtime as needed for sleep. Take immediately before bedtime     ipratropium 0.02 % nebulizer solution  Commonly known as:  ATROVENT  Take 2.5 mLs (0.5 mg total) by nebulization 4 (four) times daily as needed for wheezing or shortness of breath.     lisinopril-hydrochlorothiazide 10-12.5 MG tablet  Commonly known as:  PRINZIDE,ZESTORETIC  Take 1 tablet by mouth daily.     loratadine 10 MG tablet  Commonly known as:  CLARITIN  Take 1 tablet (10 mg total) by mouth daily.     mometasone-formoterol 100-5 MCG/ACT Aero  Commonly known as:  DULERA  Inhale 2 puffs into the lungs 2 (two) times daily.     nicotine 14 mg/24hr patch  Commonly known as:  NICODERM CQ - dosed in mg/24 hours  Place 1 patch (14 mg total) onto the skin daily.     omalizumab 150 MG injection  Commonly known as:  XOLAIR  Inject 150 mg into the skin every 14 (fourteen) days.     predniSONE 20 MG tablet  Commonly known as:  DELTASONE  Take 3 pills daily for 2 days, 2.5 pills daily for 3 days, 2 pills daily for 3 days, 1.5  pills daily for 3 days, then 1 pill daily therafter     ranitidine 150 MG tablet  Commonly known as:  ZANTAC  Take 2 tablets (300 mg total) by mouth at bedtime.     sertraline 50 MG tablet  Commonly known as:  ZOLOFT  Take 1 tablet (50 mg total) by mouth daily.        Disposition and follow-up:   AprilApril [name] was discharged from [facility] in Good condition.  At the hospital follow up visit please address:  1.  Whether we need to prolong her steroid taper of 60mg x3days, 50x3, 40x3, 30x3, then 20mg  daily thereafter  Procedures Performed:  Dg Chest 2 View  08/14/2015  CLINICAL DATA:  Wheezing, cough, shortness of breath EXAM: CHEST  2 VIEW COMPARISON:  07/12/2015 FINDINGS: The heart size and mediastinal contours are within normal limits. Both lungs are clear. The visualized skeletal structures are unremarkable. IMPRESSION: No active cardiopulmonary disease. Electronically Signed   By: Kathreen Devoid   On: 08/14/2015 13:47   Admission HPI:  April [name] is a 45 year old African-American lady with history of severe persistent asthma, morbid obesity, gastroesophageal reflux disease, and major depressive disorder, presenting with wheezing and shortness of breath for the  last 3 days.  She is followed by pulmonologist Dr. Annamaria Boots, and has been taking 10-20 mg prednisone daily for the last few years. She is also on LABA/ICS daily, albuterol MDI as needed, and inhaled ipratropium as needed. She had been getting omalizumab injections for the last 2 weeks. She is been hospitalized about 5 times per year since she was a child, but has never been intubated.   Three days ago, she started sneezing and getting itchy eyes from the pollen. Her allergies flared despite using her albuterol up to 20 times a day. She's been taking prednisone 10mg  daily as well as her maintenance dose. She felt her wheezing get progressively worse so she decided to come in today.  She used to smoke but quit one  year ago. She does have significant gastroesophageal reflux disease. One of her neighbors has a pet. She also has hypertension, but does not have any other medical problems.  Hospital Course by problem list:   1. Asthma exacerbation: She presented with an asthma exacerbation triggered by seasonal allergies. On admission she was saturating 100% on room air with mild increased work of breathing. She was given scheduled albuterol nebulizer and we increased her home dose of prednisone 20mg  daily to 60mg  daily. She was discharge on a taper of 60mg  daily for 2 days, 40mg  daily for 3 days, 30mg  daily for 3 days, then back to her home dose of 20mg  daily thereafter. We also prescribed her loratadine 10mg  daily for her allergies. We resumed her home regimen of Dulera twice daily, ipratroprium nebulizer twice daily, and albuterol nebulizers as needed upon discharge. She has follow-up with Dr. Annamaria Boots on 4/20 to be re-evaluated for Xolair injections.  Discharge Vitals:   BP 174/85 mmHg  Pulse 78  Temp(Src) 98.4 F (36.9 C) (Oral)  Resp 24  Ht 5\' 6"  (1.676 m)  Wt 335 lb 6.4 oz (152.136 kg)  BMI 54.16 kg/m2  SpO2 98%  LMP 07/18/2015  Discharge Labs:  Results for orders placed or performed during the hospital encounter of 08/14/15 (from the past 24 hour(s))  Basic metabolic panel     Status: Abnormal   Collection Time: 08/14/15  2:20 PM  Result Value Ref Range   Sodium 139 135 - 145 mmol/L   Potassium 3.6 3.5 - 5.1 mmol/L   Chloride 106 101 - 111 mmol/L   CO2 25 22 - 32 mmol/L   Glucose, Bld 86 65 - 99 mg/dL   BUN 9 6 - 20 mg/dL   Creatinine, Ser 0.80 0.44 - 1.00 mg/dL   Calcium 8.0 (L) 8.9 - 10.3 mg/dL   GFR calc non Af Amer >60 >60 mL/min   GFR calc Af Amer >60 >60 mL/min   Anion gap 8 5 - 15  CBC with Differential     Status: Abnormal   Collection Time: 08/14/15  2:20 PM  Result Value Ref Range   WBC 15.4 (H) 4.0 - 10.5 K/uL   RBC 4.21 3.87 - 5.11 MIL/uL   Hemoglobin 11.9 (L) 12.0 - 15.0  g/dL   HCT 37.5 36.0 - 46.0 %   MCV 89.1 78.0 - 100.0 fL   MCH 28.3 26.0 - 34.0 pg   MCHC 31.7 30.0 - 36.0 g/dL   RDW 16.8 (H) 11.5 - 15.5 %   Platelets 345 150 - 400 K/uL   Neutrophils Relative % 64 %   Neutro Abs 10.0 (H) 1.7 - 7.7 K/uL   Lymphocytes Relative 23 %   Lymphs Abs  3.5 0.7 - 4.0 K/uL   Monocytes Relative 11 %   Monocytes Absolute 1.7 (H) 0.1 - 1.0 K/uL   Eosinophils Relative 2 %   Eosinophils Absolute 0.2 0.0 - 0.7 K/uL   Basophils Relative 0 %   Basophils Absolute 0.0 0.0 - 0.1 K/uL  Hemoglobin A1c     Status: Abnormal   Collection Time: 08/14/15  4:55 PM  Result Value Ref Range   Hgb A1c MFr Bld 6.0 (H) 4.8 - 5.6 %   Mean Plasma Glucose 126 mg/dL   *Note: Due to a large number of results and/or encounters for the requested time period, some results have not been displayed. A complete set of results can be found in Results Review.    Signed: [name], MD 08/15/2015, 10:39 AM

## 2015-08-14 NOTE — ED Notes (Signed)
Admitting at bedside 

## 2015-08-15 LAB — HEMOGLOBIN A1C
Hgb A1c MFr Bld: 6 % — ABNORMAL HIGH (ref 4.8–5.6)
Mean Plasma Glucose: 126 mg/dL

## 2015-08-15 MED ORDER — ALBUTEROL SULFATE (2.5 MG/3ML) 0.083% IN NEBU
2.5000 mg | INHALATION_SOLUTION | Freq: Four times a day (QID) | RESPIRATORY_TRACT | Status: DC
  Administered 2015-08-15 (×2): 2.5 mg via RESPIRATORY_TRACT
  Filled 2015-08-15: qty 3

## 2015-08-15 MED ORDER — ACETAMINOPHEN 325 MG PO TABS
650.0000 mg | ORAL_TABLET | Freq: Once | ORAL | Status: AC
  Administered 2015-08-15: 650 mg via ORAL
  Filled 2015-08-15: qty 2

## 2015-08-15 MED ORDER — PREDNISONE 20 MG PO TABS: ORAL_TABLET | ORAL | Status: DC

## 2015-08-15 MED ORDER — LORATADINE 10 MG PO TABS: 10.0000 mg | ORAL_TABLET | Freq: Every day | ORAL | Status: DC

## 2015-08-15 MED ORDER — ALBUTEROL SULFATE (2.5 MG/3ML) 0.083% IN NEBU: 3.0000 mL | INHALATION_SOLUTION | RESPIRATORY_TRACT | Status: DC | PRN

## 2015-08-15 MED ORDER — ALBUTEROL SULFATE (2.5 MG/3ML) 0.083% IN NEBU: 2.5000 mg | INHALATION_SOLUTION | Freq: Four times a day (QID) | RESPIRATORY_TRACT | Status: DC | PRN

## 2015-08-15 MED FILL — predniSONE 20 MG TABS: 20 | 90 days supply | Qty: 180 | Fill #0

## 2015-08-15 NOTE — Progress Notes (Signed)
Patient ID: April [name], female   DOB: 1971/01/27, 45 y.o.   MRN: [id number]   Subjective: April [name] feels her breathing is much-improved. She would like an albuterol inhaler to go home as well as a work-note.  Objective: Vital signs in last 24 hours: Filed Vitals:   08/15/15 0517 08/15/15 0847 08/15/15 0848 08/15/15 0930  BP:  174/75 174/85   Pulse:   78   Temp: 98.4 F (36.9 C)     TempSrc: Oral     Resp: 24     Height:      Weight:      SpO2:    98%   Weight change:   Intake/Output Summary (Last 24 hours) at 08/15/15 1035 Last data filed at 08/15/15 [id number]  Gross per 24 hour  Intake    720 ml  Output   1901 ml  Net  -1181 ml   General: resting in bed comfortably, appropriately conversational Cardiac: regular rate and rhythm, no rubs, murmurs or gallops Pulm: breathing well, end-expiratory wheezes improved from yesterday Abd: bowel sounds normal, soft, nondistended, non-tender Ext: warm and well perfused, without pedal edema  Medications: I have reviewed the patient's current medications. Scheduled Meds: . acetaminophen  650 mg Oral Once  . albuterol  2.5 mg Nebulization Q6H  . enoxaparin (LOVENOX) injection  70 mg Subcutaneous Q24H  . famotidine  20 mg Oral Daily  . hydrochlorothiazide  12.5 mg Oral Daily  . lisinopril  10 mg Oral Daily  . loratadine  10 mg Oral Daily  . mometasone-formoterol  2 puff Inhalation BID  . nicotine  14 mg Transdermal Daily  . predniSONE  60 mg Oral Q breakfast  . ramelteon  8 mg Oral QHS  . sertraline  50 mg Oral Daily   Continuous Infusions:  PRN Meds:.albuterol   Assessment/Plan:  April [name] is a friendly 45 year old lady with severe persistent asthma here for an asthma exacerbation triggered by seasonal allergies.  Asthma exacerbation: Dramatically improved today. She's good to go home and has close pulmonology follow-up. -Albuterol nebulizer every 6 hours -Mometasone-formetorol 2 puffs twice daily -Prednisone 60mg  daily; will  discharge on taper plan -Continue loratadine 10mg  daily -She will follow-up with her Pulmonologist, Dr. Annamaria Boots, on 4/20  Hypertension: Pressures look better back on home regimen. -Resume home lisinopril 10mg  -Resume HCTZ 12.5mg  daily  Erythema nodosum: She denies risk factors or symptoms concerning for tuberculosis, skin lesions nor chest x-ray findings concerning for sarcoidosis, is not on oral contraceptives, does not complain of abdominal pain concerning for pancreatitis, and does not have liver enzyme abnormalities consistent with a1-antitrypsin deficiency.  Leukocytosis: Probably from chronic prednisone. No signs of pneumonia or other infection.  Dispo: Discharge to home today.  The patient does have a current PCP (Tasrif Ahmed, MD) and does not need an [facility] hospital follow-up appointment after discharge.  The patient does not know have transportation limitations that hinder transportation to clinic appointments.  .Services Needed at time of discharge: Y = Yes, Blank = No PT:   OT:   RN:   Equipment:   Other:       [name], MD 08/15/2015, 10:35 AM

## 2015-08-15 NOTE — Progress Notes (Signed)
Nsg Discharge Note  Admit Date:  08/14/2015 Discharge date: 08/15/2015   April [name] to be D/C'd Home per MD order.  AVS completed.  Copy for chart, and copy for patient signed, and dated. Patient/caregiver able to verbalize understanding.  Discharge Medication:   Medication List    STOP taking these medications        fexofenadine 60 MG tablet  Commonly known as:  ALLEGRA  Replaced by:  loratadine 10 MG tablet      TAKE these medications        albuterol (2.5 MG/3ML) 0.083% nebulizer solution  Commonly known as:  PROVENTIL  Take 3 mLs (2.5 mg total) by nebulization every 6 (six) hours as needed for wheezing or shortness of breath.     albuterol 108 (90 Base) MCG/ACT inhaler  Commonly known as:  PROVENTIL HFA;VENTOLIN HFA  Inhale 1-2 puffs into the lungs every 4 (four) hours.     eszopiclone 2 MG Tabs tablet  Commonly known as:  LUNESTA  Take 1 tablet (2 mg total) by mouth at bedtime as needed for sleep. Take immediately before bedtime     ipratropium 0.02 % nebulizer solution  Commonly known as:  ATROVENT  Take 2.5 mLs (0.5 mg total) by nebulization 4 (four) times daily as needed for wheezing or shortness of breath.     lisinopril-hydrochlorothiazide 10-12.5 MG tablet  Commonly known as:  PRINZIDE,ZESTORETIC  Take 1 tablet by mouth daily.     loratadine 10 MG tablet  Commonly known as:  CLARITIN  Take 1 tablet (10 mg total) by mouth daily.     mometasone-formoterol 100-5 MCG/ACT Aero  Commonly known as:  DULERA  Inhale 2 puffs into the lungs 2 (two) times daily.     nicotine 14 mg/24hr patch  Commonly known as:  NICODERM CQ - dosed in mg/24 hours  Place 1 patch (14 mg total) onto the skin daily.     omalizumab 150 MG injection  Commonly known as:  XOLAIR  Inject 150 mg into the skin every 14 (fourteen) days.     predniSONE 20 MG tablet  Commonly known as:  DELTASONE  Take 3 pills daily for 2 days, 2.5 pills daily for 3 days, 2 pills daily for 3 days, 1.5  pills daily for 3 days, then 1 pill daily therafter     ranitidine 150 MG tablet  Commonly known as:  ZANTAC  Take 2 tablets (300 mg total) by mouth at bedtime.     sertraline 50 MG tablet  Commonly known as:  ZOLOFT  Take 1 tablet (50 mg total) by mouth daily.        Discharge Assessment: Filed Vitals:   08/15/15 0847 08/15/15 0848  BP: 174/75 174/85  Pulse:  78  Temp:    Resp:     Skin clean, dry and intact without evidence of skin break down, no evidence of skin tears noted. IV catheter discontinued intact. Site without signs and symptoms of complications - no redness or edema noted at insertion site, patient denies c/o pain - only slight tenderness at site.  Dressing with slight pressure applied.  D/c Instructions-Education: Discharge instructions given to patient/family with verbalized understanding. D/c education completed with patient/family including follow up instructions, medication list, d/c activities limitations if indicated, with other d/c instructions as indicated by MD - patient able to verbalize understanding, all questions fully answered. Patient instructed to return to ED, call 911, or call MD for any changes in condition.  Patient  escorted via [place], and D/C home via private auto.  April Bevins Margaretha Sheffield, RN 08/15/2015 1:31 PM

## 2015-08-16 MED FILL — IPRATROPIUM BR 0.02% SOLN: 0.02 | 29 days supply | Qty: 185 | Fill #3

## 2015-08-16 MED FILL — !VENTOLIN HFA INHALER: 108 (90 BAS | 16 days supply | Qty: 18 | Fill #0

## 2015-08-16 NOTE — Telephone Encounter (Signed)
#   Vials:4 Arrival Date:08/16/15 Lot [id number] Exp Date:9/20

## 2015-08-17 ENCOUNTER — Ambulatory Visit (INDEPENDENT_AMBULATORY_CARE_PROVIDER_SITE_OTHER): Payer: Self-pay | Admitting: Internal Medicine

## 2015-08-17 ENCOUNTER — Encounter: Payer: Self-pay | Admitting: Internal Medicine

## 2015-08-17 ENCOUNTER — Ambulatory Visit: Payer: Self-pay | Admitting: Internal Medicine

## 2015-08-17 ENCOUNTER — Encounter: Payer: Self-pay | Admitting: *Deleted

## 2015-08-17 VITALS — BP 140/88 | HR 86 | Ht 66.0 in | Wt 332.6 lb

## 2015-08-17 DIAGNOSIS — Z6841 Body Mass Index (BMI) 40.0 and over, adult: Secondary | ICD-10-CM

## 2015-08-17 DIAGNOSIS — J4540 Moderate persistent asthma, uncomplicated: Secondary | ICD-10-CM

## 2015-08-17 DIAGNOSIS — F17201 Nicotine dependence, unspecified, in remission: Secondary | ICD-10-CM

## 2015-08-17 DIAGNOSIS — J45901 Unspecified asthma with (acute) exacerbation: Secondary | ICD-10-CM

## 2015-08-17 NOTE — Patient Instructions (Signed)
We will explore eligibility for Nucala shots instead of Xolair. Continue Xolair for now.  Stay on prednisone 60 mg daily through the weekend. Then start again with the taper- 5o mg x 2 days, 40 mg x 2 days, etc until you get down to hold steady at 10 mg daily  Neb xop 0.63   Dx asthma exacerbation  Depo 80

## 2015-08-17 NOTE — Assessment & Plan Note (Signed)
She assures me she has not been smoking

## 2015-08-17 NOTE — Assessment & Plan Note (Signed)
This is not going to improve substantially while she is requiring prednisone.

## 2015-08-17 NOTE — Progress Notes (Signed)
Patient ID: April [name], female    DOB: August 10, 1970, 45 y.o.   MRN: [id number]  04/08/13- 49 yoF former smoker followed for chronic severe asthma/ Xolair complicated by OSA/ CPAP,  morbid obesity, HBP, depression, GERD FOLLOWS FOR: still on Xolair; continues to wheeze alot. Feels like since taking clonazepam she wakes up with the feeling of not being able to breath. She describes waking wheezing and short of breath after taking clonazepam. It sounds as if she just sleeps deeply. Ambien caused hallucination when she was taking 2 or 3 at a time. She says she was "given" her CPAP machine at the hospital but with no followup instructions and no explanation as to how to use it.  NPSG 03/31/07- AHI 5.8/ hr, weight 330 lbs. She canceled planned more recent study.  08/02/13- 65 yoF former smoker followed for chronic severe asthma/ Xolair complicated by OSA/ CPAP,  morbid obesity, HBP, depression, GERD ACUTE VISIT: had Xolair today; having increased nasal congestion, wheezing and SOB,headaches-using nebulizers more than usual. Just ran out of Dulera. Visit on prednisone 20 mg daily ear he taking omeprazole only when needed. Aware of reflux at night. Plain spring pollen for increased nasal congestion. 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 CXR 3/21/5 IMPRESSION:  Mild chronic bronchitic changes.  No acute abnormalities.  Electronically Signed  By: Lavonia Dana M.D.  On: 07/17/2013 19:02  12/30/13- 67 yoF former smoker followed for chronic severe asthma/ Xolair complicated by OSA/ CPAP,  morbid obesity, HBP, depression, GERD FOLLOWS FOR: Pt states she has good and bad days. Pt states she has increase SOB in morning when waking up. Pt c/o mild dry cough. Pt denies CP/tightness.  Has Dulera 100, not quite enough help. No rescue inhaler.Has nebulizer. Wants to avoid ER, but frequent trips. Wants to avoid prednisone- bursts drive weight up. None in 2 days. Discussed low maintenance to stabilize. Off  Xolair since divorce stopped her health insurance. Assistance program being explored.  Works third shift.. Significant difficulty initiating and maintaining sleep. OTC diphenhydramine not helpful. CXR 11/23/13 IMPRESSION:  No active cardiopulmonary disease.  Electronically Signed  By: Dereck Ligas M.D.  On: 11/23/2013 12:06   03/30/14- 43 yoF former smoker followed for chronic severe asthma/ Xolair complicated by OSA/ CPAP,  morbid obesity, HBP, depression, GERD FOLLOWS FOR: Post hospital-was kept overnight 11-29 through 11-30 for asthma exacerbation; pt also wants to have her Xolair injection today. Continues to wheeze and not back at baseline. Asthma had been getting worse for 2 days before she went to ER. Still some wheeze and "not back to baseline. 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 Still feels tight with scant white sputum. Taking prednisone 10 mg daily. Throat itches and she coughs at night while working. Continues Xolair but lack of transportation causes occasional missed dose. Chronic insomnia-Ambien is too expensive. Hoping to avoid increased steroid dose because of difficulty with weight control. CXR 03/27/14 IMPRESSION: No acute infiltrate or pulmonary edema. Central mild bronchitic changes. Electronically Signed  By: Lahoma Crocker M.D.  On: 03/27/2014 14:05  10/24/14- [name] former smoker followed for chronic severe asthma/ Xolair complicated by OSA/ CPAP,  morbid obesity, HBP, depression, GERD ACUTE VISIT: Continues to have wheezing episodes; feels as though [employer] Mann[employer] is no longer working. Many missed appointments, ER trips hoping to avoid co-pay. Nebulizer twice daily. Got a Xolair injection today but has missed many. Wheezing often wakes her or is present when  she wakes up. Says [employer] was not helpful. Continues prednisone 10 mg twice daily. Office spirometry 10/24/14- severe obstructive airways disease. FVC 2.10/66%, FEV1  1.25/47%, FEV1/FVC 0.59, FEF 25-75 percent 0.64/19%. CXR 09/15/14- IMPRESSION: No pneumonia or effusion. Peribronchial thickening may indicate bronchitis. Electronically Signed  By: Ivar Drape M.D.  On: 09/15/2014 16:14  03/20/15- 45 year old female former smoker followed for chronic severe asthma/Xolair complicated by noncompliance, OSA/CPAP, morbid obesity, HBP, depression, GERD xolair 225mg  q2w-Dr. Annamaria Boots CPAP auto/[facility] Gets xolair thru [facility]- needs financial assistance recertified 2 admissions and 8 ER visits in the last 6 months. She admits she frequently misses Xolair injections. Best control has been with prednisone. She is currently tapering from 30 mg to maintenance 20 mg daily. She says she can't lie down without wheezing on 20 mg daily. Not acutely ill and denies fever, chest pain, purulent sputum. Insomnia bothering her. Asks cheaper alternative to Ambien. Wakes during the night and can't get back to sleep. Usually some wheeze. FENO 12- not elevated while on steroid therapy Office Spirometry 03/20/15-Moderate Restriction Secondary to Obesity and Moderate to Severe Obstruction Secondary to Chronic Asthma CXR 11/14/./16 IMPRESSION: No acute cardiopulmonary process. Electronically Signed  By: Elon Alas M.D.  On: 03/13/2015 05:44  08/17/2015-45 year old female former smoker followed for chronic severe asthma/Xolair complicated by noncompliance, OSA/CPAP, morbid obesity, HBP, depression, GERD Post hospital; pt feels like she was released from hospital too soon. Increased wheezing and SOB-continues Xolair injections as well. Denies smoking. Repeated hospitalizations for exacerbations of asthmatic bronchitis. Blames pollen now for making her "sick". She thinks she was discharged from hospital too quickly last time. Actively wheezing despite steroid taper. Thinks that Xolair helps but we recognize compliance has been intermittent.  Review of Systems-See  HPI Constitutional:   No-   weight loss, night sweats, fevers, chills, +fatigue, lassitude. HEENT:   No-  headaches, difficulty swallowing, tooth/dental problems, sore throat,    sneezing, itching, ear ache, +nasal congestion, post nasal drip,  CV:  No-   chest pain, orthopnea, PND, swelling in lower extremities, anasarca, dizziness, palpitations Resp: +shortness of breath with exertion or at rest.              productive cough,  + non-productive cough,  No- coughing up of blood.              No-   change in color of mucus. +wheezing.   Skin: No-   rash or lesions. GI:  No-   heartburn, indigestion, abdominal pain, nausea, vomiting,  GU: MS:  No-   joint pain or swelling.   Neuro-     nothing unusual Psych:  No- change in mood or affect. No depression or anxiety.  No memory loss.    Objective:   Physical Exam General- Alert, Oriented, Affect-appropriate, Distress- none acute, + morbid  obesity,  Skin- rash-none, lesions- none, excoriation- none Lymphadenopathy- none Head- atraumatic            Eyes- Gross vision intact, PERRLA, conjunctivae clear secretions            Ears- Hearing, canals-normal            Nose- Clear, no-Septal dev, mucus, polyps, erosion, perforation             Throat- Mallampati II , mucosa clear , drainage- none, tonsils- atrophic,  Neck- flexible , trachea midline, no stridor , thyroid nl, carotid no bruit Chest - symmetrical excursion , unlabored           Heart/CV- RRR , no murmur , no gallop  , no rub, nl s1 s2                           - JVD- none , edema- none, stasis changes- none, varices- none           Lung- +Diminished/ clear now, unlabored.  Wheeze +Inspiratory and expiratory, cough-none,                                                 dullness-none,  rub- none           Chest wall-  Abd-  Br/ Gen/ Rectal- Not done, not indicated Extrem- cyanosis- none, clubbing, none, atrophy- none, strength- nl Neuro- grossly  intact to observation

## 2015-08-17 NOTE — Assessment & Plan Note (Signed)
Severe persistent asthma with exacerbation. We have been unable to keep her off of prednisone which is masking eosinophilia. I still think she has not done well enough with Xolair and a trial of Nucala would be justified if we can get it approved.. Plan-slow prednisone taper down to 10 mg daily, Xopenex nebulizer treatment, Depo-Medrol at this visit

## 2015-08-18 MED ORDER — OMALIZUMAB 150 MG ~~LOC~~ SOLR
225.0000 mg | Freq: Once | SUBCUTANEOUS | Status: AC
Start: 2015-08-18 — End: 2015-08-17
  Administered 2015-08-17: 225 mg via SUBCUTANEOUS

## 2015-08-18 NOTE — Addendum Note (Signed)
Addended by: Len Blalock on: 08/18/2015 12:06 PM   Modules accepted: Orders

## 2015-08-21 ENCOUNTER — Ambulatory Visit (INDEPENDENT_AMBULATORY_CARE_PROVIDER_SITE_OTHER): Payer: Self-pay | Admitting: Internal Medicine

## 2015-08-21 ENCOUNTER — Encounter: Payer: Self-pay | Admitting: Internal Medicine

## 2015-08-21 VITALS — BP 201/113 | HR 73 | Temp 98.4°F | Ht 66.0 in | Wt 334.9 lb

## 2015-08-21 DIAGNOSIS — J45901 Unspecified asthma with (acute) exacerbation: Secondary | ICD-10-CM

## 2015-08-21 MED ORDER — OMEPRAZOLE 40 MG PO CPDR: 40.0000 mg | DELAYED_RELEASE_CAPSULE | Freq: Every day | ORAL | Status: DC

## 2015-08-21 MED ORDER — MOMETASONE FURO-FORMOTEROL FUM 200-5 MCG/ACT IN AERO: 2.0000 | INHALATION_SPRAY | Freq: Two times a day (BID) | RESPIRATORY_TRACT | Status: DC

## 2015-08-21 NOTE — Patient Instructions (Signed)
Start taking prilosec 40mg  daily. Stop taking over the counter ranitidine.   Start using the new dose of dulera inhaler twice a day.   Take prednisone 60mg  daily and continue taper in 3 days.

## 2015-08-21 NOTE — Progress Notes (Signed)
Subjective:   Patient ID: April [name] female   DOB: Feb 20, 1971 45 y.o.   MRN: [id number]  HPI: [name] is a 45 y.o. with past medical history as outlined below who presents to clinic for hospital follow up. She was admitted 4/17-18 for an asthma exacerbation and has since seen her pulmonologist Dr Annamaria Boots 4 days ago who continued prednisone taper and is in the process of trying to get her approved for Nucala. Pt states she feels like she was discharged from the hospital too earlier and is still having SOB. She has wheezing and has not been sleeping well. She endorses dry cough that is minimal. Denies sick contacts, or fevers. She has been using her dulera inhaler BID and albuterol inhaler as needed, which has been 6-7 times today. She has duonebs at home and last used it at 4:30am, she is supposed to use it QID but has difficulty using it that often. She just started reduced prednisone 50mg  dose from 60mg  dose this morning.   Please see problem list for status of the pt's chronic medical problems.  Past Medical History  Diagnosis Date  . Acanthosis nigricans   . Menorrhagia   . Hypertension, essential   . Insomnia   . Morbid obesity ([place])   . Helicobacter pylori (H. pylori) infection   . Sleep apnea     Sleep study 2008 - mild OSA, not enough events to titrate CPAP  . COPD (chronic obstructive pulmonary disease) ([place])     PFTs in 2002, FEV1/FVC 65, no post bronchodilater test done  . Asthma     Followed by Dr. Annamaria Boots (pulmonology); receives every other week omalizumab injections; has frequent exacerbations  . Depression   . GERD (gastroesophageal reflux disease)   . Tobacco user   . Obesity   . Shortness of breath   . Headache(784.0)   . Seasonal allergies   . Arthritis    Current Outpatient Prescriptions  Medication Sig Dispense Refill  . albuterol (PROVENTIL HFA;VENTOLIN HFA) 108 (90 Base) MCG/ACT inhaler Inhale 1-2 puffs into the lungs every 4 (four) hours. (Patient taking  differently: Inhale 1-2 puffs into the lungs every 4 (four) hours as needed for wheezing or shortness of breath. ) 1 Inhaler 5  . albuterol (PROVENTIL) (2.5 MG/3ML) 0.083% nebulizer solution Take 3 mLs (2.5 mg total) by nebulization every 6 (six) hours as needed for wheezing or shortness of breath. 150 mL 1  . eszopiclone (LUNESTA) 2 MG TABS tablet Take 1 tablet (2 mg total) by mouth at bedtime as needed for sleep. Take immediately before bedtime 30 tablet 5  . ipratropium (ATROVENT) 0.02 % nebulizer solution Take 2.5 mLs (0.5 mg total) by nebulization 4 (four) times daily as needed for wheezing or shortness of breath. 75 mL 2  . lisinopril-hydrochlorothiazide (PRINZIDE,ZESTORETIC) 10-12.5 MG per tablet Take 1 tablet by mouth daily. 30 tablet 2  . loratadine (CLARITIN) 10 MG tablet Take 1 tablet (10 mg total) by mouth daily. 90 tablet 3  . mometasone-formoterol (DULERA) 100-5 MCG/ACT AERO Inhale 2 puffs into the lungs 2 (two) times daily. 1 Inhaler 5  . nicotine (NICODERM CQ - DOSED IN MG/24 HOURS) 14 mg/24hr patch Place 1 patch (14 mg total) onto the skin daily. 28 patch 0  . omalizumab (XOLAIR) 150 MG injection Inject 150 mg into the skin every 14 (fourteen) days.    . predniSONE (DELTASONE) 20 MG tablet Take 3 pills daily for 2 days, 2.5 pills daily for 3 days,  2 pills daily for 3 days, 1.5 pills daily for 3 days, then 1 pill daily therafter 180 tablet 0  . ranitidine (ZANTAC) 150 MG tablet Take 2 tablets (300 mg total) by mouth at bedtime. 180 tablet 3  . sertraline (ZOLOFT) 50 MG tablet Take 1 tablet (50 mg total) by mouth daily. 30 tablet 1   No current facility-administered medications for this visit.   Family History  Problem Relation Age of Onset  . Asthma Daughter   . Cancer Paternal Aunt   . Asthma Maternal Grandmother   . Hypertension Mother    Social History   Social History  . Marital Status: Single    Spouse Name: N/A  . Number of Children: 1  . Years of Education: N/A    Occupational History  . 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 Smoking status: Former Smoker -- 18 years    Types: Cigarettes    Quit date: 09/15/2014  . Smokeless tobacco: Former Systems developer    Quit date: 09/12/2014  . Alcohol Use: No  . Drug Use: No  . Sexual Activity: Not on file   Other Topics Concern  . 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    Review of Systems: Review of Systems  Constitutional: Negative for fever.  Respiratory: Positive for cough, shortness of breath and wheezing. Negative for sputum production.   Psychiatric/Behavioral: The patient has insomnia.     Objective:  Physical Exam: Filed Vitals:   08/21/15 1108  BP: 201/113  Pulse: 73  Temp: 98.4 F (36.9 C)  TempSrc: Oral  Height: 5\' 6"  (1.676 m)  Weight: 334 lb 14.4 oz (151.91 kg)  SpO2: 100%   Physical Exam  Constitutional: She appears well-developed and well-nourished. No distress.  HENT:  Head: Normocephalic and atraumatic.  Nose: Nose normal.  Eyes: Conjunctivae and EOM are normal. No scleral icterus.  Cardiovascular: Normal rate, regular rhythm and normal heart sounds.  Exam reveals no gallop and no friction rub.   No murmur heard. Pulmonary/Chest: Effort normal. No respiratory distress. She has wheezes (throughout, good air movement). She has no rales.  Speaking comfortably in full sentences, no accessory muscle use  Abdominal: Soft. Bowel sounds are normal. She exhibits no distension and no mass. There is no tenderness. There is no rebound and no guarding.  Skin: Skin is warm and dry. No rash noted. She is not diaphoretic. No erythema. No pallor.   Assessment & Plan:   Please see problem based assessment and plan.

## 2015-08-21 NOTE — Assessment & Plan Note (Addendum)
Pt here for acute asthma exacerbation follow up which she was admitted 4/17-18 for. She feels that her breathing has not improved and she still has significant wheezing. She is compliant with her inhalers, although she has not been using her nebulizers QID as directed. She has seen Dr. Annamaria Boots 4 days ago who she follows for her severe asthma who is in the process of getting her approved for nucala. She is on a prednisone taper and just started 50mg  dose this morning. On exam she has audible wheezing without stethoscope auscultation, and on wheezing throughout all lung fields. She has good air movement, speaking in full sentences, not using accessory muscles, and is satting 100% on room air. Pt likely needs compliance with GERD med, inhalers, and nebulizers. Also possible that pt has allergic bronchopulmonary aspergillosis causing her recurrent asthma exacerbations and flares when reducing steroid dose.   - increased dulera inhaler to 200-14mcg dose BID - rx for prilosec 40mg  qd, can reduce this dose to 20mg  once sx have improved - continue prednisone 60mg  x 3 days and resume scheduled taper - checking aspergillus antibody

## 2015-08-22 ENCOUNTER — Encounter (HOSPITAL_COMMUNITY): Payer: Self-pay | Admitting: Family Medicine

## 2015-08-22 ENCOUNTER — Emergency Department (HOSPITAL_COMMUNITY): Payer: Self-pay

## 2015-08-22 ENCOUNTER — Inpatient Hospital Stay (HOSPITAL_COMMUNITY)
Admission: EM | Admit: 2015-08-22 | Discharge: 2015-08-24 | DRG: 202 | Disposition: A | Discharge status: Home / Self Care | Payer: Self-pay | Attending: Internal Medicine | Admitting: Internal Medicine

## 2015-08-22 ENCOUNTER — Other Ambulatory Visit: Payer: Self-pay

## 2015-08-22 DIAGNOSIS — I10 Essential (primary) hypertension: Secondary | ICD-10-CM | POA: Diagnosis present

## 2015-08-22 DIAGNOSIS — Z87891 Personal history of nicotine dependence: Secondary | ICD-10-CM

## 2015-08-22 DIAGNOSIS — Z7952 Long term (current) use of systemic steroids: Secondary | ICD-10-CM

## 2015-08-22 DIAGNOSIS — F17201 Nicotine dependence, unspecified, in remission: Secondary | ICD-10-CM

## 2015-08-22 DIAGNOSIS — T380X5A Adverse effect of glucocorticoids and synthetic analogues, initial encounter: Secondary | ICD-10-CM | POA: Diagnosis present

## 2015-08-22 DIAGNOSIS — F329 Major depressive disorder, single episode, unspecified: Secondary | ICD-10-CM

## 2015-08-22 DIAGNOSIS — Z8249 Family history of ischemic heart disease and other diseases of the circulatory system: Secondary | ICD-10-CM

## 2015-08-22 DIAGNOSIS — D72829 Elevated white blood cell count, unspecified: Secondary | ICD-10-CM | POA: Diagnosis present

## 2015-08-22 DIAGNOSIS — E876 Hypokalemia: Secondary | ICD-10-CM | POA: Diagnosis present

## 2015-08-22 DIAGNOSIS — Z825 Family history of asthma and other chronic lower respiratory diseases: Secondary | ICD-10-CM

## 2015-08-22 DIAGNOSIS — R0602 Shortness of breath: Secondary | ICD-10-CM

## 2015-08-22 DIAGNOSIS — R0682 Tachypnea, not elsewhere classified: Secondary | ICD-10-CM | POA: Diagnosis present

## 2015-08-22 DIAGNOSIS — K219 Gastro-esophageal reflux disease without esophagitis: Secondary | ICD-10-CM | POA: Diagnosis present

## 2015-08-22 DIAGNOSIS — Z809 Family history of malignant neoplasm, unspecified: Secondary | ICD-10-CM

## 2015-08-22 DIAGNOSIS — J45901 Unspecified asthma with (acute) exacerbation: Secondary | ICD-10-CM | POA: Diagnosis present

## 2015-08-22 DIAGNOSIS — J302 Other seasonal allergic rhinitis: Secondary | ICD-10-CM | POA: Diagnosis present

## 2015-08-22 DIAGNOSIS — R7303 Prediabetes: Secondary | ICD-10-CM | POA: Diagnosis present

## 2015-08-22 DIAGNOSIS — G4733 Obstructive sleep apnea (adult) (pediatric): Secondary | ICD-10-CM | POA: Diagnosis present

## 2015-08-22 DIAGNOSIS — F339 Major depressive disorder, recurrent, unspecified: Secondary | ICD-10-CM | POA: Diagnosis present

## 2015-08-22 DIAGNOSIS — J441 Chronic obstructive pulmonary disease with (acute) exacerbation: Secondary | ICD-10-CM

## 2015-08-22 DIAGNOSIS — J449 Chronic obstructive pulmonary disease, unspecified: Secondary | ICD-10-CM | POA: Diagnosis present

## 2015-08-22 DIAGNOSIS — J4551 Severe persistent asthma with (acute) exacerbation: Principal | ICD-10-CM | POA: Diagnosis present

## 2015-08-22 DIAGNOSIS — L52 Erythema nodosum: Secondary | ICD-10-CM | POA: Diagnosis present

## 2015-08-22 DIAGNOSIS — Z9989 Dependence on other enabling machines and devices: Secondary | ICD-10-CM

## 2015-08-22 LAB — CBC WITH DIFFERENTIAL/PLATELET
Basophils Absolute: 0 10*3/uL (ref 0.0–0.1)
Basophils Relative: 0 %
EOS ABS: 0.1 10*3/uL (ref 0.0–0.7)
Eosinophils Relative: 0 %
HCT: 40 % (ref 36.0–46.0)
HEMOGLOBIN: 12.4 g/dL (ref 12.0–15.0)
LYMPHS ABS: 4.5 10*3/uL — AB (ref 0.7–4.0)
LYMPHS PCT: 23 %
MCH: 27.7 pg (ref 26.0–34.0)
MCHC: 31 g/dL (ref 30.0–36.0)
MCV: 89.5 fL (ref 78.0–100.0)
Monocytes Absolute: 1.8 10*3/uL — ABNORMAL HIGH (ref 0.1–1.0)
Monocytes Relative: 9 %
NEUTROS ABS: 12.9 10*3/uL — AB (ref 1.7–7.7)
NEUTROS PCT: 68 %
Platelets: 328 10*3/uL (ref 150–400)
RBC: 4.47 MIL/uL (ref 3.87–5.11)
RDW: 16.4 % — ABNORMAL HIGH (ref 11.5–15.5)
WBC: 19.3 10*3/uL — AB (ref 4.0–10.5)

## 2015-08-22 LAB — COMPREHENSIVE METABOLIC PANEL
ALT: 24 U/L (ref 14–54)
AST: 21 U/L (ref 15–41)
Albumin: 3.1 g/dL — ABNORMAL LOW (ref 3.5–5.0)
Alkaline Phosphatase: 57 U/L (ref 38–126)
Anion gap: 13 (ref 5–15)
BUN: 14 mg/dL (ref 6–20)
CHLORIDE: 106 mmol/L (ref 101–111)
CO2: 23 mmol/L (ref 22–32)
CREATININE: 0.83 mg/dL (ref 0.44–1.00)
Calcium: 8.5 mg/dL — ABNORMAL LOW (ref 8.9–10.3)
GFR calc Af Amer: >60 mL/min (ref >60)
Glucose, Bld: 117 mg/dL — ABNORMAL HIGH (ref 65–99)
Potassium: 3 mmol/L — ABNORMAL LOW (ref 3.5–5.1)
Sodium: 142 mmol/L (ref 135–145)
Total Bilirubin: 0.4 mg/dL (ref 0.3–1.2)
Total Protein: 6 g/dL — ABNORMAL LOW (ref 6.5–8.1)

## 2015-08-22 LAB — I-STAT VENOUS BLOOD GAS, ED
ACID-BASE EXCESS: 3 mmol/L — AB (ref 0.0–2.0)
Bicarbonate: 27.2 mEq/L — ABNORMAL HIGH (ref 20.0–24.0)
O2 SAT: 98 %
PH VEN: 7.442 — AB (ref 7.250–7.300)
TCO2: 28 mmol/L (ref 0–100)
pCO2, Ven: 39.8 mmHg — ABNORMAL LOW (ref 45.0–50.0)
pO2, Ven: 105 mmHg — ABNORMAL HIGH (ref 31.0–45.0)

## 2015-08-22 LAB — D-DIMER, QUANTITATIVE: D-Dimer, Quant: <0.27 ug/mL-FEU (ref 0.00–0.50)

## 2015-08-22 LAB — BRAIN NATRIURETIC PEPTIDE: B NATRIURETIC PEPTIDE 5: 70.8 pg/mL (ref 0.0–100.0)

## 2015-08-22 MED ORDER — LISINOPRIL 10 MG PO TABS
10.0000 mg | ORAL_TABLET | Freq: Every day | ORAL | Status: DC
  Administered 2015-08-22 – 2015-08-24 (×3): 10 mg via ORAL
  Filled 2015-08-22 (×3): qty 1

## 2015-08-22 MED ORDER — NICOTINE 14 MG/24HR TD PT24
14.0000 mg | MEDICATED_PATCH | Freq: Every day | TRANSDERMAL | Status: DC
  Administered 2015-08-22 – 2015-08-24 (×3): 14 mg via TRANSDERMAL
  Filled 2015-08-22 (×3): qty 1

## 2015-08-22 MED ORDER — SERTRALINE HCL 50 MG PO TABS
50.0000 mg | ORAL_TABLET | Freq: Every day | ORAL | Status: DC
  Administered 2015-08-22 – 2015-08-24 (×3): 50 mg via ORAL
  Filled 2015-08-22 (×3): qty 1

## 2015-08-22 MED ORDER — SODIUM CHLORIDE 0.9% FLUSH
3.0000 mL | Freq: Two times a day (BID) | INTRAVENOUS | Status: DC
  Administered 2015-08-22: 3 mL via INTRAVENOUS
  Administered 2015-08-23: 10 mL via INTRAVENOUS
  Administered 2015-08-23 – 2015-08-24 (×2): 3 mL via INTRAVENOUS

## 2015-08-22 MED ORDER — METHYLPREDNISOLONE SODIUM SUCC 125 MG IJ SOLR
125.0000 mg | Freq: Once | INTRAMUSCULAR | Status: AC
  Administered 2015-08-22: 125 mg via INTRAVENOUS
  Filled 2015-08-22: qty 2

## 2015-08-22 MED ORDER — MOMETASONE FURO-FORMOTEROL FUM 200-5 MCG/ACT IN AERO
2.0000 | INHALATION_SPRAY | Freq: Two times a day (BID) | RESPIRATORY_TRACT | Status: DC
Start: 2015-08-22 — End: 2015-08-22

## 2015-08-22 MED ORDER — LORATADINE 10 MG PO TABS
10.0000 mg | ORAL_TABLET | Freq: Every day | ORAL | Status: DC
  Administered 2015-08-22 – 2015-08-24 (×3): 10 mg via ORAL
  Filled 2015-08-22 (×3): qty 1

## 2015-08-22 MED ORDER — METHYLPREDNISOLONE SODIUM SUCC 125 MG IJ SOLR
60.0000 mg | Freq: Four times a day (QID) | INTRAMUSCULAR | Status: DC
  Administered 2015-08-22 – 2015-08-23 (×5): 60 mg via INTRAVENOUS
  Filled 2015-08-22 (×6): qty 2

## 2015-08-22 MED ORDER — ZOLPIDEM TARTRATE 5 MG PO TABS
5.0000 mg | ORAL_TABLET | Freq: Every evening | ORAL | Status: DC | PRN
  Administered 2015-08-22: 5 mg via ORAL
  Administered 2015-08-23: 10 mg via ORAL
  Filled 2015-08-22 (×3): qty 1

## 2015-08-22 MED ORDER — IPRATROPIUM BROMIDE 0.02 % IN SOLN
0.5000 mg | Freq: Once | RESPIRATORY_TRACT | Status: AC
  Administered 2015-08-22: 0.5 mg via RESPIRATORY_TRACT
  Filled 2015-08-22: qty 2.5

## 2015-08-22 MED ORDER — LISINOPRIL-HYDROCHLOROTHIAZIDE 10-12.5 MG PO TABS
1.0000 | ORAL_TABLET | Freq: Every day | ORAL | Status: DC
Start: 2015-08-22 — End: 2015-08-22

## 2015-08-22 MED ORDER — PANTOPRAZOLE SODIUM 40 MG PO TBEC
40.0000 mg | DELAYED_RELEASE_TABLET | Freq: Every day | ORAL | Status: DC
  Administered 2015-08-22 – 2015-08-24 (×3): 40 mg via ORAL
  Filled 2015-08-22 (×3): qty 1

## 2015-08-22 MED ORDER — ALBUTEROL SULFATE (2.5 MG/3ML) 0.083% IN NEBU
INHALATION_SOLUTION | RESPIRATORY_TRACT | Status: AC
Start: 2015-08-22 — End: 2015-08-23
  Filled 2015-08-22: qty 6

## 2015-08-22 MED ORDER — POTASSIUM CHLORIDE CRYS ER 20 MEQ PO TBCR
40.0000 meq | EXTENDED_RELEASE_TABLET | Freq: Once | ORAL | Status: AC
  Administered 2015-08-22: 40 meq via ORAL
  Filled 2015-08-22: qty 2

## 2015-08-22 MED ORDER — HYDROCHLOROTHIAZIDE 12.5 MG PO CAPS
12.5000 mg | ORAL_CAPSULE | Freq: Every day | ORAL | Status: DC
  Administered 2015-08-22 – 2015-08-24 (×3): 12.5 mg via ORAL
  Filled 2015-08-22 (×3): qty 1

## 2015-08-22 MED ORDER — SENNOSIDES-DOCUSATE SODIUM 8.6-50 MG PO TABS: 1.0000 | ORAL_TABLET | Freq: Every evening | ORAL | Status: DC | PRN

## 2015-08-22 MED ORDER — MAGNESIUM SULFATE 2 GM/50ML IV SOLN
2.0000 g | Freq: Once | INTRAVENOUS | Status: AC
  Administered 2015-08-22: 2 g via INTRAVENOUS
  Filled 2015-08-22: qty 50

## 2015-08-22 MED ORDER — ACETAMINOPHEN 650 MG RE SUPP: 650.0000 mg | Freq: Four times a day (QID) | RECTAL | Status: DC | PRN

## 2015-08-22 MED ORDER — ALBUTEROL SULFATE (2.5 MG/3ML) 0.083% IN NEBU
5.0000 mg | INHALATION_SOLUTION | Freq: Once | RESPIRATORY_TRACT | Status: AC
  Administered 2015-08-22: 5 mg via RESPIRATORY_TRACT

## 2015-08-22 MED ORDER — ALBUTEROL SULFATE (2.5 MG/3ML) 0.083% IN NEBU
5.0000 mg | INHALATION_SOLUTION | RESPIRATORY_TRACT | Status: DC | PRN
  Administered 2015-08-23 – 2015-08-24 (×2): 5 mg via RESPIRATORY_TRACT
  Filled 2015-08-22 (×2): qty 6

## 2015-08-22 MED ORDER — ALBUTEROL (5 MG/ML) CONTINUOUS INHALATION SOLN
10.0000 mg/h | INHALATION_SOLUTION | Freq: Once | RESPIRATORY_TRACT | Status: AC
  Administered 2015-08-22: 10 mg/h via RESPIRATORY_TRACT
  Filled 2015-08-22: qty 20

## 2015-08-22 MED ORDER — MOMETASONE FURO-FORMOTEROL FUM 200-5 MCG/ACT IN AERO
2.0000 | INHALATION_SPRAY | Freq: Two times a day (BID) | RESPIRATORY_TRACT | Status: DC
  Administered 2015-08-23 – 2015-08-24 (×3): 2 via RESPIRATORY_TRACT
  Filled 2015-08-22: qty 8.8

## 2015-08-22 MED ORDER — IPRATROPIUM-ALBUTEROL 0.5-2.5 (3) MG/3ML IN SOLN
3.0000 mL | RESPIRATORY_TRACT | Status: DC
  Administered 2015-08-22: 3 mL via RESPIRATORY_TRACT
  Filled 2015-08-22: qty 3

## 2015-08-22 MED ORDER — ACETAMINOPHEN 325 MG PO TABS
650.0000 mg | ORAL_TABLET | Freq: Four times a day (QID) | ORAL | Status: DC | PRN
Start: 2015-08-22 — End: 2015-08-24
  Administered 2015-08-22 – 2015-08-24 (×4): 650 mg via ORAL
  Filled 2015-08-22 (×3): qty 2

## 2015-08-22 MED ORDER — FLUTICASONE PROPIONATE 50 MCG/ACT NA SUSP
2.0000 | Freq: Every day | NASAL | Status: DC
  Administered 2015-08-23: 2 via NASAL
  Filled 2015-08-22: qty 16

## 2015-08-22 MED ORDER — ENOXAPARIN SODIUM 40 MG/0.4ML ~~LOC~~ SOLN
40.0000 mg | SUBCUTANEOUS | Status: DC
  Administered 2015-08-22 – 2015-08-23 (×2): 40 mg via SUBCUTANEOUS
  Filled 2015-08-22 (×2): qty 0.4

## 2015-08-22 NOTE — ED Provider Notes (Signed)
CSN: [id number]     Arrival date & time 08/22/15  1312 History   First MD Initiated Contact with Patient 08/22/15 1737     Chief Complaint  Patient presents with  . Asthma     (Consider location/radiation/quality/duration/timing/severity/associated sxs/prior Treatment) The history is provided by the patient.  45 year old female with past medical history of hypertension, obesity, COPD, who presents with persistent shortness of breath. The patient was just hospitalized with the internal medicine service for acute asthma exacerbation. She was discharged on outpatient prednisone taper. She states that she never improved significantly at discharge and has progressively felt worse since discharge. She was seen in clinic yesterday, and her inhalers were increased. She states she has been taking her meds and inhalers as prescribed but has persistent shortness of breath. She states she is unable to walk more than several feet without becoming severely short of breath. She subsequently presents for further evaluation.   Past Medical History  Diagnosis Date  . Acanthosis nigricans   . Menorrhagia   . Hypertension, essential   . Insomnia   . Morbid obesity ([place])   . Helicobacter pylori (H. pylori) infection   . Sleep apnea     Sleep study 2008 - mild OSA, not enough events to titrate CPAP  . COPD (chronic obstructive pulmonary disease) ([place])     PFTs in 2002, FEV1/FVC 65, no post bronchodilater test done  . Asthma     Followed by Dr. Annamaria Boots (pulmonology); receives every other week omalizumab injections; has frequent exacerbations  . Depression   . GERD (gastroesophageal reflux disease)   . Tobacco user   . Obesity   . Shortness of breath   . Headache(784.0)   . Seasonal allergies   . Arthritis    Past Surgical History  Procedure Laterality Date  . Tubal ligation  1996    bilateral  . Breast reduction surgery  09/2011   Family History  Problem Relation Age of Onset  . Asthma Daughter    . Cancer Paternal Aunt   . Asthma Maternal Grandmother   . Hypertension Mother    Social History  Substance Use Topics  . Smoking status: Former Smoker -- 18 years    Types: Cigarettes    Quit date: 09/15/2014  . Smokeless tobacco: Former Systems developer    Quit date: 09/12/2014  . Alcohol Use: No   OB History    No data available     Review of Systems  Constitutional: Positive for fatigue. Negative for fever and chills.  HENT: Negative for congestion and rhinorrhea.   Eyes: Negative for visual disturbance.  Respiratory: Positive for cough, shortness of breath and wheezing.   Cardiovascular: Negative for chest pain and leg swelling.  Gastrointestinal: Negative for nausea, vomiting, abdominal pain and diarrhea.  Genitourinary: Negative for dysuria and flank pain.  Skin: Negative for rash.  Allergic/Immunologic: Negative for immunocompromised state.  Neurological: Negative for syncope, weakness and headaches.      Allergies  Review of patient's allergies indicates no known allergies.  Home Medications   Prior to Admission medications   Medication Sig Start Date End Date Taking? Authorizing Provider  albuterol (PROVENTIL HFA;VENTOLIN HFA) 108 (90 Base) MCG/ACT inhaler Inhale 1-2 puffs into the lungs every 4 (four) hours. Patient taking differently: Inhale 1-2 puffs into the lungs every 4 (four) hours as needed for wheezing or shortness of breath.  07/13/15  Yes Deneise Lever, MD  albuterol (PROVENTIL) (2.5 MG/3ML) 0.083% nebulizer solution Take 3 mLs (2.5 mg  total) by nebulization every 6 (six) hours as needed for wheezing or shortness of breath. 11/18/14  Yes Arnoldo Morale, MD  eszopiclone (LUNESTA) 2 MG TABS tablet Take 1 tablet (2 mg total) by mouth at bedtime as needed for sleep. Take immediately before bedtime 03/20/15  Yes Deneise Lever, MD  ipratropium (ATROVENT) 0.02 % nebulizer solution Take 2.5 mLs (0.5 mg total) by nebulization 4 (four) times daily as needed for wheezing or  shortness of breath. 11/18/14  Yes Arnoldo Morale, MD  lisinopril-hydrochlorothiazide (PRINZIDE,ZESTORETIC) 10-12.5 MG per tablet Take 1 tablet by mouth daily. 11/18/14  Yes Arnoldo Morale, MD  loratadine (CLARITIN) 10 MG tablet Take 1 tablet (10 mg total) by mouth daily. 08/15/15  Yes [name], MD  mometasone-formoterol [facility]) 200-5 MCG/ACT AERO Inhale 2 puffs into the lungs 2 (two) times daily. 08/21/15  Yes [name], MD  omalizumab [place] Right) 150 MG injection Inject 150 mg into the skin every 14 (fourteen) days.   Yes Historical Provider, MD  omeprazole (PRILOSEC) 40 MG capsule Take 1 capsule (40 mg total) by mouth daily. 08/21/15 08/20/16 Yes [name], MD  predniSONE (DELTASONE) 20 MG tablet Take 3 pills daily for 2 days, 2.5 pills daily for 3 days, 2 pills daily for 3 days, 1.5 pills daily for 3 days, then 1 pill daily therafter 08/15/15  Yes [name], MD  sertraline (ZOLOFT) 50 MG tablet Take 1 tablet (50 mg total) by mouth daily. 11/18/14  Yes Arnoldo Morale, MD   BP 155/93 mmHg  Pulse 99  Temp(Src) 98 F (36.7 C) (Oral)  Resp 21  SpO2 100%  LMP 08/15/2015 Physical Exam  Constitutional: She appears well-developed and well-nourished. She appears distressed.  HENT:  Head: Normocephalic.  Mouth/Throat: No oropharyngeal exudate.  Eyes: Pupils are equal, round, and reactive to light.  Neck: Normal range of motion. Neck supple.  Cardiovascular: Normal rate, regular rhythm, normal heart sounds and intact distal pulses.   No murmur heard. Pulmonary/Chest: Tachypnea noted. She is in respiratory distress. She has decreased breath sounds. She has wheezes. She has rhonchi in the right lower field and the left lower field. She has rales.  Abdominal: Soft. She exhibits no distension. There is no tenderness.  Musculoskeletal: She exhibits no edema.  Neurological: She is alert. She exhibits normal muscle tone.  Skin: Skin is warm.  Nursing note and vitals reviewed.   ED Course   Procedures (including critical care time) Labs Review Labs Reviewed  CBC WITH DIFFERENTIAL/PLATELET - Abnormal; Notable for the following:    WBC 19.3 (*)    RDW 16.4 (*)    Neutro Abs 12.9 (*)    Lymphs Abs 4.5 (*)    Monocytes Absolute 1.8 (*)    All other components within normal limits  COMPREHENSIVE METABOLIC PANEL - Abnormal; Notable for the following:    Potassium 3.0 (*)    Glucose, Bld 117 (*)    Calcium 8.5 (*)    Total Protein 6.0 (*)    Albumin 3.1 (*)    All other components within normal limits  I-STAT VENOUS BLOOD GAS, ED - Abnormal; Notable for the following:    pH, Ven 7.442 (*)    pCO2, Ven 39.8 (*)    pO2, Ven 105.0 (*)    Bicarbonate 27.2 (*)    Acid-Base Excess 3.0 (*)    All other components within normal limits  MRSA PCR SCREENING  BRAIN NATRIURETIC PEPTIDE  D-DIMER, QUANTITATIVE (NOT AT [facility])  TECHNOLOGIST SMEAR REVIEW  HEPATITIS C ANTIBODY (REFLEX)  MAGNESIUM  BASIC METABOLIC PANEL  IMMUNOGLOBULINS A/E/G/M, SERUM    Imaging Review Dg Chest Portable 1 View  08/22/2015  CLINICAL DATA:  Shortness of breath EXAM: PORTABLE CHEST 1 VIEW COMPARISON:  08/14/2015 FINDINGS: The heart size and mediastinal contours are within normal limits. Both lungs are clear. The visualized skeletal structures are unremarkable. IMPRESSION: No active disease. Electronically Signed   By: Kerby Moors M.D.   On: 08/22/2015 19:13   I have personally reviewed and evaluated these images and lab results as part of my medical decision-making.   EKG Interpretation   Date/Time:  Tuesday August 22 2015 18:35:34 EDT Ventricular Rate:  87 PR Interval:  125 QRS Duration: 82 QT Interval:  398 QTC Calculation: 479 R Axis:   64 Text Interpretation:  Sinus rhythm Consider right atrial enlargement  Anterior infarct, old Minimal ST depression, diffuse leads No significant  change was found Confirmed by Wyvonnia Dusky  MD, STEPHEN [phone]) on 08/22/2015  6:40:32 PM      MDM   45 yo F  with PMHx of HTN, HLD, OSA, asthma/COPD s/p recent admission for exacerbation who p/w persistent SOB, wheezing. On arrival, pt tachypnic to 20-30s, with audible diffuse wheezing and increased WOB. Concern for ongoing asthma/COPD exacerbation. Will start IV steroids, nebs, and magnesium. Will also work-up for alternative etiologies such as CHF, PE. No signs of upper airway involvement such as stridor, hoarseness, etc. Do not suspect RPA or epiglottitis and wheezing is pulmonary in etiology.  EKG shows no acute ischemia. D-Dimer negative. CXR clear. BNP normal. Pt has moderate leukocytosis, likely 2/2 steroids. Breathing improved after treatments. Will admit to IM. Pt in agreement.  Clinical Impression: 1. Asthma exacerbation in COPD ([place])   2. Leukocytosis   3. Hypokalemia   4. Morbid obesity due to excess calories ([place])     Disposition: Admit  Condition: Stable  Pt seen in conjunction with Dr. Everitt Amber, MD 08/22/15 2113  Ezequiel Essex, MD 08/23/15 [phone]

## 2015-08-22 NOTE — H&P (Signed)
Date: 08/22/2015               Patient Name:  April [name] MRN: [id number]  DOB: 1971-03-16 Age / Sex: 45 y.o., female   PCP: [name], MD         Medical Service: Internal Medicine Teaching Service         Attending Physician: Dr. Aldine Contes, MD    First Contact: Dr. [name] Pager: [phone]  Second Contact: Dr. [name] Pager: [phone]       After Hours (After 5p/  First Contact Pager: [phone]  weekends / holidays): Second Contact Pager: [phone]   Chief Complaint: Asthma flare up  History of Present Illness:  April [name] is a 45 year old female with PMH of severe persistent asthma on chronic steroids, OSA, GERD, Depression, and Morbid Obesity who presents with wheezing and shortness of breath.   She was hospitalized for the same from 4/17-4/18 and treated with albuterol nebulizer and discharged on a prednisone taper with resumption of home Dulera, Ipratropium nebulizers, and albuterol nebulizers.   She was seen by Pulmonology on 4/20 and noted to have continued wheezing. She receives Omalizumab injections every 2 weeks which was administered at that time and continued on Prednisone 60 mg with planned slow taper to maintenance 10 mg. Pulmonology is working on approval to switch to mepolizumab from omalizumab.   She was then seen in [facility] on 4/24 and home dulera inhaler was increased to 200-52mcg dose BID and Prednisone was increased back to 60 mg with plan to resume scheduled taper. Aspergillus antibody was drawn and she was also started on Prilosec for GERD.   Patient returns to the ED with continued wheezing and SOB with exertion. She says this is the worst flare up she has had and does not feel much change since her hospitalization. She reports using her Dulera twice a day, Albuterol/ipratropium nebulizers 4 times daily, and rescue Albuterol up to 15 times per day recently. She says her symptoms seem to have been more frequent since moving into a new apartment in  December which contained mold. She says her breathing is worse with exertion and at night. She says she is able to sleep laying flat and has previously used a CPAP machine, but lost it after she moved. She reports associated chills and headaches, but no fevers, productive cough, chest pain, rhinorrhea, orthopnea, peripheral edema, N/V/D/C, abdominal pain, or myalgias.  Patient is a former smoker of 1 PPD since age 55, quitting ~3 years ago. She denies EtOH or illicit drug use. She denies recent travel, sick contacts, or pets at home.  In the ED, patient was tachypneic with audible wheezing and increased work of breathing. She was given IV Solumedrol, Atrovent nebulizers, Continuous albuterol nebs, and magnesium. BNP and D-dimer were negative. CXR with consolidation or infiltration.  Meds: Current Facility-Administered Medications  Medication Dose Route Frequency Provider Last Rate Last Dose  . acetaminophen (TYLENOL) tablet 650 mg  650 mg Oral Q6H PRN [name], MD       Or  . acetaminophen (TYLENOL) suppository 650 mg  650 mg Rectal Q6H PRN Marjan Rabbani, MD      . albuterol (PROVENTIL) (2.5 MG/3ML) 0.083% nebulizer solution 5 mg  5 mg Nebulization Q2H PRN Marjan Rabbani, MD      . enoxaparin (LOVENOX) injection 40 mg  40 mg Subcutaneous Q24H Marjan Rabbani, MD   40 mg at 08/22/15 2228  . fluticasone (FLONASE) 50 MCG/ACT nasal  spray 2 spray  2 spray Each Nare Daily Marjan Rabbani, MD      . lisinopril (PRINIVIL,ZESTRIL) tablet 10 mg  10 mg Oral Daily Nischal Narendra, MD   10 mg at 08/22/15 2227   And  . hydrochlorothiazide (MICROZIDE) capsule 12.5 mg  12.5 mg Oral Daily Nischal Narendra, MD   12.5 mg at 08/22/15 2227  . ipratropium-albuterol (DUONEB) 0.5-2.5 (3) MG/3ML nebulizer solution 3 mL  3 mL Nebulization Q4H while awake Marjan Rabbani, MD   3 mL at 08/22/15 2248  . loratadine (CLARITIN) tablet 10 mg  10 mg Oral Daily Marjan Rabbani, MD   10 mg at 08/22/15 2228  . methylPREDNISolone  sodium succinate (SOLU-MEDROL) 125 mg/2 mL injection 60 mg  60 mg Intravenous Q6H Marjan Rabbani, MD   60 mg at 08/22/15 2228  . mometasone-formoterol (DULERA) 200-5 MCG/ACT inhaler 2 puff  2 puff Inhalation BID [name], MD   2 puff at 08/22/15 2249  . nicotine (NICODERM CQ - dosed in mg/24 hours) patch 14 mg  14 mg Transdermal Daily Marjan Rabbani, MD   14 mg at 08/22/15 2227  . pantoprazole (PROTONIX) EC tablet 40 mg  40 mg Oral Daily Marjan Rabbani, MD   40 mg at 08/22/15 2227  . senna-docusate (Senokot-S) tablet 1 tablet  1 tablet Oral QHS PRN Marjan Rabbani, MD      . sertraline (ZOLOFT) tablet 50 mg  50 mg Oral Daily Marjan Rabbani, MD   50 mg at 08/22/15 2227  . sodium chloride flush (NS) 0.9 % injection 3 mL  3 mL Intravenous Q12H Marjan Rabbani, MD      . zolpidem (AMBIEN) tablet 5 mg  5 mg Oral QHS PRN,MR X 1 Marjan Rabbani, MD   5 mg at 08/22/15 2228    Allergies: Allergies as of 08/22/2015  . (No Known Allergies)   Past Medical History  Diagnosis Date  . Acanthosis nigricans   . Menorrhagia   . Hypertension, essential   . Insomnia   . Morbid obesity ([place])   . Helicobacter pylori (H. pylori) infection   . Sleep apnea     Sleep study 2008 - mild OSA, not enough events to titrate CPAP  . COPD (chronic obstructive pulmonary disease) ([place])     PFTs in 2002, FEV1/FVC 65, no post bronchodilater test done  . Asthma     Followed by Dr. Annamaria Boots (pulmonology); receives every other week omalizumab injections; has frequent exacerbations  . Depression   . GERD (gastroesophageal reflux disease)   . Tobacco user   . Obesity   . Shortness of breath   . Headache(784.0)   . Seasonal allergies   . Arthritis    Past Surgical History  Procedure Laterality Date  . Tubal ligation  1996    bilateral  . Breast reduction surgery  09/2011   Family History  Problem Relation Age of Onset  . Asthma Daughter   . Cancer Paternal Aunt   . Asthma Maternal Grandmother   . Hypertension  Mother    Social History   Social History  . Marital Status: Single    Spouse Name: N/A  . Number of Children: 1  . 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 Smoking status: Former Smoker -- 18 years    Types: Cigarettes    Quit date: 09/15/2014  . Smokeless tobacco: Former Systems developer    Quit date: 09/12/2014  .  Alcohol Use: No  . Drug Use: No  . Sexual Activity: Not on file   Other Topics Concern  . 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     Review of Systems: Review of Systems  Constitutional: Positive for chills. Negative for fever, weight loss, malaise/fatigue and diaphoresis.  HENT: Negative for congestion, ear discharge, ear pain and sore throat.   Eyes: Negative for discharge.  Respiratory: Positive for shortness of breath and wheezing. Negative for cough, hemoptysis and sputum production.   Cardiovascular: Negative for chest pain, palpitations, orthopnea and leg swelling.  Gastrointestinal: Negative for heartburn, nausea, vomiting, abdominal pain, diarrhea and constipation.  Genitourinary: Positive for frequency. Negative for dysuria.  Musculoskeletal: Negative for myalgias and falls.  Neurological: Positive for dizziness and headaches. Negative for loss of consciousness.     Physical Exam: Blood pressure 190/125, pulse 91, temperature 98.3 F (36.8 C), temperature source Oral, resp. rate 20, height 5\' 6"  (1.676 m), last menstrual period 08/15/2015, SpO2 97 %. Physical Exam  Constitutional: She is oriented to person, place, and time. She appears well-developed and well-nourished. No distress.  Obese woman, sitting up in chair, conversing appropriately  HENT:  Head: Normocephalic and atraumatic.  Mouth/Throat: Oropharynx is clear and moist. No oropharyngeal exudate.    Eyes: EOM are normal. Pupils are equal, round, and reactive to light.  Cardiovascular: Regular rhythm and intact distal pulses.   Slight tachycardia  Pulmonary/Chest: Effort normal. No respiratory distress. She has wheezes. She has no rales. She exhibits no tenderness.  Audible wheezing w/o stethoscope, Bilateral expiratory wheezing on lung exam, no rhonchi or rales  Abdominal: Soft. Bowel sounds are normal. She exhibits no distension. There is no tenderness.  Musculoskeletal: Normal range of motion. She exhibits no edema or tenderness.  Neurological: She is alert and oriented to person, place, and time.  Skin: Skin is warm. She is not diaphoretic.  Psychiatric: She has a normal mood and affect.     Lab results: Basic Metabolic Panel:  Recent Labs  08/22/15 1809  NA 142  K 3.0*  CL 106  CO2 23  GLUCOSE 117*  BUN 14  CREATININE 0.83  CALCIUM 8.5*   Liver Function Tests:  Recent Labs  08/22/15 1809  AST 21  ALT 24  ALKPHOS 57  BILITOT 0.4  PROT 6.0*  ALBUMIN 3.1*   No results for input(s): LIPASE, AMYLASE in the last 72 hours. No results for input(s): AMMONIA in the last 72 hours. CBC:  Recent Labs  08/22/15 1809  WBC 19.3*  NEUTROABS 12.9*  HGB 12.4  HCT 40.0  MCV 89.5  PLT 328   Cardiac Enzymes: No results for input(s): CKTOTAL, CKMB, CKMBINDEX, TROPONINI in the last 72 hours. BNP: No results for input(s): PROBNP in the last 72 hours. D-Dimer:  Recent Labs  08/22/15 1907  DDIMER <0.27   CBG: No results for input(s): GLUCAP in the last 72 hours. Hemoglobin A1C: No results for input(s): HGBA1C in the last 72 hours. Fasting Lipid Panel: No results for input(s): CHOL, HDL, LDLCALC, TRIG, CHOLHDL, LDLDIRECT in the last 72 hours. Thyroid Function Tests: No results for input(s): TSH, T4TOTAL, FREET4, T3FREE, THYROIDAB in the last 72 hours. Anemia Panel: No results for input(s): VITAMINB12, FOLATE, FERRITIN, TIBC, IRON, RETICCTPCT in the last 72  hours. Coagulation: No results for input(s): LABPROT, INR in the  last 72 hours. Urine Drug Screen: Drugs of Abuse     Component Value Date/Time   LABOPIA NONE DETECTED 09/15/2014 1515   COCAINSCRNUR NONE DETECTED 09/15/2014 1515   LABBENZ NONE DETECTED 09/15/2014 1515   AMPHETMU NONE DETECTED 09/15/2014 1515   THCU NONE DETECTED 09/15/2014 1515   LABBARB NONE DETECTED 09/15/2014 1515    Alcohol Level: No results for input(s): ETH in the last 72 hours. Urinalysis: No results for input(s): COLORURINE, LABSPEC, PHURINE, GLUCOSEU, HGBUR, BILIRUBINUR, KETONESUR, PROTEINUR, UROBILINOGEN, NITRITE, LEUKOCYTESUR in the last 72 hours.  Invalid input(s): APPERANCEUR   Imaging results:  Dg Chest Portable 1 View  08/22/2015  CLINICAL DATA:  Shortness of breath EXAM: PORTABLE CHEST 1 VIEW COMPARISON:  08/14/2015 FINDINGS: The heart size and mediastinal contours are within normal limits. Both lungs are clear. The visualized skeletal structures are unremarkable. IMPRESSION: No active disease. Electronically Signed   By: Kerby Moors M.D.   On: 08/22/2015 19:13    Other results: EKG: sinus rhythm, slow R wave progression.  Assessment & Plan by Problem: Principal Problem:   Acute asthma exacerbation Active Problems:   Major depressive disorder, recurrent episode (HCC)   Obstructive sleep apnea   Leukocytosis   Seasonal allergic rhinitis   Tobacco abuse, in remission   Hypokalemia   Prediabetes  45 year old female with PMH of severe persistent asthma on chronic steroids, OSA, GERD, Depression, and Morbid Obesity who presents with wheezing and shortness of breath.  Severe Persistent Asthma with exacerbation: Patient feels improved after treatment in the ED. She has continued audible wheezing, but is oxygenating well on room air. Her frequent flare ups seem temporally related to her recent move to her mold containing apartment. She does not have any signs of underlying infection, but does  have a leukocytosis likely related to her steroid use. We will continue IV solumedrol, breathing treatments, and allergy medications. Will check immunoglobulins for underlying immunodeficiency causing her frequent exacerbations. -Albuterol nebulizer every 2 hours prn -Mometasone-formetorol 2 puffs twice daily -Duoneb every 4 hours while awake -IV solu-medrol 60 mg q6h -Loratadine 10mg  daily -Flonase -f/u immunoglobulins and aspergillus  Hypertension: Pressures up to 190s as she did not take her home medications today. -Continue home lisinopril 10mg  daily now -Continue home HCTZ 12.5mg  daily now  GERD: -Protonix 40 mg po daily  OSA: Patient previously using CPAP, but no longer has at home -CPAP at night  Tobacco use: She quit smoking 3 years ago but is requesting a nicotine patch for her urges. -Nicotine patch 14mg  daily  Depression:  -Continue sertraline 50mg  daily    Dispo: Disposition is deferred at this time, awaiting improvement of current medical problems. Anticipated discharge in approximately 1-3 day(s).   The patient does have a current PCP (Tasrif Ahmed, MD) and does need an [facility] hospital follow-up appointment after discharge.  The patient does not have transportation limitations that hinder transportation to clinic appointments.  Signed: Zada Finders, MD 08/22/2015, 11:28 PM

## 2015-08-22 NOTE — ED Notes (Signed)
All interventions performed by Park Breed performed under direct supervision of Carlyn Reichert RN.

## 2015-08-22 NOTE — ED Notes (Signed)
Repeat pressure prior to admit 170/104, pt has home meds ordered but unavailable in ED and pt has not taken medication today. Floor RN made aware of same

## 2015-08-22 NOTE — ED Notes (Signed)
MD at bedside. 

## 2015-08-22 NOTE — ED Notes (Signed)
Pt here for asthma. sts that she was just discharged yesterday from upstairs and not better. Pt taking treatments at home without relief. Pt audible wheezing.

## 2015-08-22 NOTE — ED Notes (Signed)
Checked pt O2 stats after breathing treatment 99%

## 2015-08-23 ENCOUNTER — Inpatient Hospital Stay (HOSPITAL_COMMUNITY): Payer: MEDICAID

## 2015-08-23 LAB — BASIC METABOLIC PANEL
Anion gap: 12 (ref 5–15)
BUN: 11 mg/dL (ref 6–20)
CHLORIDE: 100 mmol/L — AB (ref 101–111)
CO2: 25 mmol/L (ref 22–32)
CREATININE: 0.81 mg/dL (ref 0.44–1.00)
Calcium: 8.3 mg/dL — ABNORMAL LOW (ref 8.9–10.3)
GFR calc Af Amer: >60 mL/min (ref >60)
GFR calc non Af Amer: >60 mL/min (ref >60)
GLUCOSE: 180 mg/dL — AB (ref 65–99)
POTASSIUM: 3.6 mmol/L (ref 3.5–5.1)
SODIUM: 137 mmol/L (ref 135–145)

## 2015-08-23 LAB — SEDIMENTATION RATE: SED RATE: 6 mm/h (ref 0–22)

## 2015-08-23 LAB — MAGNESIUM: Magnesium: 2.3 mg/dL (ref 1.7–2.4)

## 2015-08-23 LAB — MRSA PCR SCREENING: MRSA BY PCR: NEGATIVE

## 2015-08-23 LAB — TECHNOLOGIST SMEAR REVIEW

## 2015-08-23 MED ORDER — IBUPROFEN 400 MG PO TABS
800.0000 mg | ORAL_TABLET | Freq: Once | ORAL | Status: AC
  Administered 2015-08-23: 800 mg via ORAL
  Filled 2015-08-23: qty 2

## 2015-08-23 MED ORDER — THEOPHYLLINE ER 300 MG PO TB12
300.0000 mg | ORAL_TABLET | Freq: Every day | ORAL | Status: DC
  Administered 2015-08-23 – 2015-08-24 (×2): 300 mg via ORAL
  Filled 2015-08-23 (×2): qty 1

## 2015-08-23 MED ORDER — IPRATROPIUM-ALBUTEROL 0.5-2.5 (3) MG/3ML IN SOLN
3.0000 mL | Freq: Four times a day (QID) | RESPIRATORY_TRACT | Status: DC
  Administered 2015-08-23 – 2015-08-24 (×5): 3 mL via RESPIRATORY_TRACT
  Filled 2015-08-23 (×6): qty 3

## 2015-08-23 NOTE — Progress Notes (Signed)
Patient unsure about wearing CPAP tonight but stated she would call when ready. RT will continue to monitor.

## 2015-08-23 NOTE — Progress Notes (Signed)
NIV set up for patient use. Pt stated that she wasn't ready for bed yet. Humidity provided and patient stated that her comfort pressure was 7. Pressure of 7 was set and ready for use. Education given to patient on the proper use and handling of the machine and patient verbalized understanding. No distress noted.

## 2015-08-23 NOTE — Progress Notes (Signed)
I saw and evaluated the patient. I personally confirmed the key portions of Dr. Caron Presume history and exam and reviewed pertinent patient test results. The assessment, diagnosis, and plan were formulated together and I agree with the documentation in the resident's note.  Patient appears very comfortable upon my examination.  She does have expiratory wheezes but also decent air movement on auscultation of her lungs with absolutely no increased work of breathing.  We discussed the need to control her reflux and converted her to a PPI from an H2 blocker.  We encouraged the use of a spacer all of the time.  We demonstrated the proper inhaler technique and stressed the importance of the breath hold.  We also increased the dose of her inhaled steroid.  She is to continue her other asthma medications.

## 2015-08-23 NOTE — Care Management Note (Signed)
Case Management Note  Patient Details  Name: April [name] MRN: [id number] Date of Birth: 1970/11/26  Subjective/Objective:          CM following for progression and d/c planning.          Action/Plan: Noted consult for assistance with meds. This CM may be able to assist with meds with MATCH form at time of d/c. Pt has been helped in the past year, we will ask for an override. Will also provide pt with info on programs from the manufactures that provide the meds that she needs. We can not order these drugs but with the assistance of her PCP office she may be able to qualify for ongoing assistance. Will need to complete [place] letter on day of D/C .   Expected Discharge Date:                  Expected Discharge Plan:  Home/Self Care  In-House Referral:  NA  Discharge planning Services  CM Consult  Post Acute Care Choice:  NA Choice offered to:  NA  DME Arranged:    DME Agency:     HH Arranged:    HH Agency:     Status of Service:  Completed, signed off  Medicare Important Message Given:    Date Medicare IM Given:    Medicare IM give by:    Date Additional Medicare IM Given:    Additional Medicare Important Message give by:     If discussed at [place] of Stay Meetings, dates discussed:    Additional Comments:  Adron Bene, RN 08/23/2015, 5:01 PM

## 2015-08-23 NOTE — Progress Notes (Signed)
Patient ID: April [name], female   DOB: [birth year], 45 y.o.   MRN: [id number]   Subjective: April [name] breathing has improved slightly but she is still not at her baseline. She thinks the mold in her house and pollen outside caused her flare.  Objective: Vital signs in last 24 hours: Filed Vitals:   08/23/15 0415 08/23/15 0525 08/23/15 0839 08/23/15 0851  BP:  153/79  159/81  Pulse:  82  86  Temp:  97.8 F (36.6 C)  98.4 F (36.9 C)  TempSrc:  Oral  Oral  Resp:  18  18  Height:      SpO2: 94% 97% 96% 99%   General: friendly overweight lady sleeping when we walked in, speaking in full sentences without shortness of breath HEENT: no scleral icterus, extra-ocular muscles intact, oropharynx without lesions Cardiac: regular rate and rhythm, no rubs, murmurs or gallops Pulm: breathing well, diffuse end-expiratory wheezes without basilar crackles Abd: bowel sounds normal, soft, nondistended, non-tender Ext: warm and well perfused, without pedal edema Lymph: no cervical or supraclavicular lymphadenopathy Skin: tender ecchymoses on anterior shins, no nailfold changes, oral lesions, or nasal polyps Neuro: alert and oriented X3, cranial nerves II-XII grossly intact, moving all extremities well  Lab Results: Basic Metabolic Panel:  Recent Labs Lab 08/22/15 1809 08/23/15 0546  NA 142 137  K 3.0* 3.6  CL 106 100*  CO2 23 25  GLUCOSE 117* 180*  BUN 14 11  CREATININE 0.83 0.81  CALCIUM 8.5* 8.3*  MG  --  2.3   Studies/Results: Dg Chest Portable 1 View  08/22/2015  CLINICAL DATA:  Shortness of breath EXAM: PORTABLE CHEST 1 VIEW COMPARISON:  08/14/2015 FINDINGS: The heart size and mediastinal contours are within normal limits. Both lungs are clear. The visualized skeletal structures are unremarkable. IMPRESSION: No active disease. Electronically Signed   By: Kerby Moors M.D.   On: 08/22/2015 19:13   Medications: I have reviewed the patient's current medications. Scheduled Meds: .  enoxaparin (LOVENOX) injection  40 mg Subcutaneous Q24H  . fluticasone  2 spray Each Nare Daily  . lisinopril  10 mg Oral Daily   And  . hydrochlorothiazide  12.5 mg Oral Daily  . ipratropium-albuterol  3 mL Nebulization QID  . loratadine  10 mg Oral Daily  . methylPREDNISolone (SOLU-MEDROL) injection  60 mg Intravenous Q6H  . mometasone-formoterol  2 puff Inhalation BID  . nicotine  14 mg Transdermal Daily  . pantoprazole  40 mg Oral Daily  . sertraline  50 mg Oral Daily  . sodium chloride flush  3 mL Intravenous Q12H   Continuous Infusions:  PRN Meds:.acetaminophen **OR** acetaminophen, albuterol, senna-docusate, zolpidem   Assessment/Plan:  April [name] is a friendly 45 year old lady with severe persistent asthma here for one among countless admissions for asthma exacerbation.  Asthma exacerbation atop severe persistent asthma: This is one of countless admissions so I will address every possible aspect of her asthma to help prevent future admission. I think compliance is a big problem atop already severe disease. She's already on maximal therapy with omalizumab injections, prednisone '60mg'$  daily, high-dose inhaled corticosteroid, long-acting beta agonist, and albuterol as needed, but continues to flare. Other ideas are ABPA or Churg-Strauss. I spoke to Dr. Annamaria Boots, her pulmonologist, who felt a chest CT was reasonable to look for gloved finger findings on chest CT, and she has aspergillosis serum antibodies pending. She has had elevated IgE levels in 2013 of 280 but is not above 1000 as commonly  seen in ABPA. Regarding Churg-Strauss, does have erythema nodosum but does not have oral lesions or nasal polyposis, nor other symptoms suggestive of vasculitis, and an ESR was normal, so I do not think she has this. Dr. Annamaria Boots is also recommending theophylline '300mg'$  daily; I've called pharmacy to see if this could be an affordable option. Respiratory therapy was also kind enough to help teach her how to use  her CPAP machine today. -Continue DuoNebs 4 times daily -Continue Atrovent 0.'5mg'$  once daily -Continue Dulera twice daily -Continue methylprednisolone '60mg'$  IV four times daily; will discharge on prednisone '80mg'$  daily -Continue pantoprazole '40mg'$  daily -Started theophylline '300mg'$  daily -Follow-up chest CT; if concerning for ABPA, will consider starting antifungals -Follow-up pharmacy assistant for theophylline availability -Ordered home-health for assistance with her inhalers -Thank you respiratory therapy for assisting with teaching her how to use her CPAP -Consulted social work for medication payment assistance -Will discharge on metformin '500mg'$  twice daily  Dispo: Disposition is deferred at this time, awaiting improvement of current medical problems.  Anticipated discharge in approximately 1-2 day(s).   The patient does have a current PCP (April Ahmed, MD) and does need an [facility] hospital follow-up appointment after discharge.  The patient does have transportation limitations that hinder transportation to clinic appointments.  .Services Needed at time of discharge: Y = Yes, Blank = No PT:   OT:   RN:   Equipment:   Other:     LOS: 1 day   [name], MD 08/23/2015, 11:42 AM

## 2015-08-23 NOTE — Care Management Note (Signed)
Case Management Note  Patient Details  Name: April [name] MRN: [id number] Date of Birth: Nov 07, 1970  Subjective/Objective:           CM following for progression and d/c planning.         Action/Plan: 08/23/2015 Noted CM referral, however this pt does not qualify for [facility] based on PCP not being part of [facility] and the pt is uninsured. She also does not qualify for [facility] services unless we can identify a true skilled need that could be preformed by a HHRN only, and in this circumstance we would try to qualify her for charity care.  At this time I can not identify such a need. Assistance at home is selfpay for everyone , unless she qualifies for Medicaid and after hospitalization applies for this benefit and later personal care services. This process can take place only after the pt is d/c from the hospital with the assistance of her PCP.   Expected Discharge Date:                  Expected Discharge Plan:  Home/Self Care  In-House Referral:  NA  Discharge planning Services  CM Consult  Post Acute Care Choice:  NA Choice offered to:  NA  DME Arranged:    DME Agency:     HH Arranged:    HH Agency:     Status of Service:  Completed, signed off  Medicare Important Message Given:    Date Medicare IM Given:    Medicare IM give by:    Date Additional Medicare IM Given:    Additional Medicare Important Message give by:     If discussed at [place] of Stay Meetings, dates discussed:    Additional Comments:  Adron Bene, RN 08/23/2015, 12:03 PM

## 2015-08-24 LAB — IMMUNOGLOBULINS A/E/G/M, SERUM
IGE (IMMUNOGLOBULIN E), SERUM: 262 [IU]/mL — AB (ref 0–100)
IGM, SERUM: 68 mg/dL (ref 26–217)
IgA: 262 mg/dL (ref 87–352)
IgG (Immunoglobin G), Serum: 644 mg/dL — ABNORMAL LOW (ref 700–1600)

## 2015-08-24 LAB — HEPATITIS C ANTIBODY (REFLEX)

## 2015-08-24 LAB — HCV COMMENT:

## 2015-08-24 MED ORDER — THEOPHYLLINE ER 300 MG PO TB12: 300.0000 mg | ORAL_TABLET | Freq: Every day | ORAL | Status: DC

## 2015-08-24 MED ORDER — ALBUTEROL SULFATE HFA 108 (90 BASE) MCG/ACT IN AERS: 1.0000 | INHALATION_SPRAY | RESPIRATORY_TRACT | Status: DC | PRN

## 2015-08-24 MED ORDER — METFORMIN HCL 500 MG PO TABS: 500.0000 mg | ORAL_TABLET | Freq: Two times a day (BID) | ORAL | Status: DC

## 2015-08-24 MED ORDER — PREDNISONE 20 MG PO TABS: ORAL_TABLET | ORAL | Status: DC

## 2015-08-24 MED ORDER — METHYLPREDNISOLONE SODIUM SUCC 125 MG IJ SOLR
60.0000 mg | Freq: Four times a day (QID) | INTRAMUSCULAR | Status: DC
  Administered 2015-08-24 (×2): 60 mg via INTRAVENOUS
  Filled 2015-08-24: qty 2

## 2015-08-24 NOTE — Progress Notes (Signed)
Patient refusing CPAP for tonight. 

## 2015-08-24 NOTE — Progress Notes (Signed)
Patient ID: April [name], female   DOB: February 06, 1971, 45 y.o.   MRN: [id number]   Subjective: April [name] breathing is dramatically-improved and she is ready to go home today.  Objective: Vital signs in last 24 hours: Filed Vitals:   08/24/15 0808 08/24/15 0848 08/24/15 0933 08/24/15 1052  BP: 180/105 179/93  160/76  Pulse: 85     Temp: 98 F (36.7 C)     TempSrc: Oral     Resp: 20     Height:      SpO2: 98%  98%    General: friendly overweight lady sleeping when we walked in, speaking in full sentences without shortness of breath Cardiac: regular rate and rhythm, no rubs, murmurs or gallops Pulm: breathing well, diffuse end-expiratory wheezes, improved from yesterday, without basilar crackles Ext: warm and well perfused, without pedal edema Lymph: no cervical or supraclavicular lymphadenopathy Skin: tender ecchymoses on anterior shins, no nailfold changes, oral lesions, or nasal polyps Neuro: alert and oriented X3, cranial nerves II-XII grossly intact, moving all extremities well  Studies/Results: Ct Chest Wo Contrast  08/23/2015  CLINICAL DATA:  Shortness of Breath, persistent asthma EXAM: CT CHEST WITHOUT CONTRAST TECHNIQUE: Multidetector CT imaging of the chest was performed following the standard protocol without IV contrast. COMPARISON:  Chest x-ray 08/22/2015 FINDINGS: Mediastinum/Lymph Nodes: Central airways are patent. Images of the thoracic inlet are unremarkable. No mediastinal hematoma or adenopathy. Central pulmonary artery and thoracic aorta is unremarkable. Heart size within normal limits. No pericardial effusion. There is no hilar adenopathy on this unenhanced scan. Lungs/Pleura: There is no pleural thickening or pleural effusion. Images of lung parenchyma shows no acute infiltrate or pulmonary edema. No bronchiectasis. No focal consolidation. No pneumothorax. A single small emphysematous bulla is noted in right upper lobe centrally. Upper abdomen: The visualized unenhanced  upper abdomen is unremarkable. No adrenal gland mass is noted. Musculoskeletal: Sagittal images of the spine shows no destructive bony lesions. Mild degenerative changes upper and lower thoracic spine. There are streaky artifacts from patient's large body habitus. IMPRESSION: 1. No acute infiltrate or pulmonary edema. No bronchiectasis. A single tiny emphysematous bulla is noted in right upper lobe centrally. 2. No mediastinal hematoma or adenopathy.  No hilar adenopathy. 3. Mild degenerative changes upper and lower thoracic spine. Electronically Signed   By: Lahoma Crocker M.D.   On: 08/23/2015 15:42   Dg Chest Portable 1 View  08/22/2015  CLINICAL DATA:  Shortness of breath EXAM: PORTABLE CHEST 1 VIEW COMPARISON:  08/14/2015 FINDINGS: The heart size and mediastinal contours are within normal limits. Both lungs are clear. The visualized skeletal structures are unremarkable. IMPRESSION: No active disease. Electronically Signed   By: Kerby Moors M.D.   On: 08/22/2015 19:13   Medications: I have reviewed the patient's current medications. Scheduled Meds: . enoxaparin (LOVENOX) injection  40 mg Subcutaneous Q24H  . fluticasone  2 spray Each Nare Daily  . lisinopril  10 mg Oral Daily   And  . hydrochlorothiazide  12.5 mg Oral Daily  . ipratropium-albuterol  3 mL Nebulization QID  . loratadine  10 mg Oral Daily  . methylPREDNISolone (SOLU-MEDROL) injection  60 mg Intravenous Q6H  . mometasone-formoterol  2 puff Inhalation BID  . nicotine  14 mg Transdermal Daily  . pantoprazole  40 mg Oral Daily  . sertraline  50 mg Oral Daily  . sodium chloride flush  3 mL Intravenous Q12H  . theophylline  300 mg Oral Daily   Continuous Infusions:  PRN Meds:.acetaminophen **  OR** acetaminophen, albuterol, senna-docusate, zolpidem   Assessment/Plan:  April [name] is a friendly 45 year old lady with severe persistent asthma here for one among countless admissions for asthma exacerbation.  Asthma exacerbation atop  severe persistent asthma: This is much-improved. Her chest CT did not show ABPA changes. I'll discharge her on prednisone 80mg  daily with a month-long taper,  theophylline 300mg  daily, a CPAP machine, and metformin. She has follow-up in our clinic and with Dr. Annamaria Boots.  Dispo: Disposition is deferred at this time, awaiting improvement of current medical problems.  Anticipated discharge in approximately 0 day(s).   The patient does have a current PCP (Tasrif Ahmed, MD) and does need an [facility] hospital follow-up appointment after discharge.  The patient does not know have transportation limitations that hinder transportation to clinic appointments.  .Services Needed at time of discharge: Y = Yes, Blank = No PT:   OT:   RN:   Equipment:   Other:     LOS: 2 days   [name], MD 08/24/2015, 1:06 PM

## 2015-08-24 NOTE — Discharge Summary (Signed)
Name: April [name] MRN: [id number] DOB: 10/20/1970 46 y.o. PCP: [name], MD  Date of Admission: 08/22/2015  5:32 PM Date of Discharge: 08/24/2015 Attending Physician: Aldine Contes, MD  Discharge Diagnosis: 1. Severe persistent asthma with acute exacerbation   Discharge Medications:   Medication List    STOP taking these medications        eszopiclone 2 MG Tabs tablet  Commonly known as:  LUNESTA      TAKE these medications        albuterol (2.5 MG/3ML) 0.083% nebulizer solution  Commonly known as:  PROVENTIL  Take 3 mLs (2.5 mg total) by nebulization every 6 (six) hours as needed for wheezing or shortness of breath.     albuterol 108 (90 Base) MCG/ACT inhaler  Commonly known as:  PROVENTIL HFA;VENTOLIN HFA  Inhale 1-2 puffs into the lungs every 4 (four) hours as needed for wheezing or shortness of breath.     ipratropium 0.02 % nebulizer solution  Commonly known as:  ATROVENT  Take 2.5 mLs (0.5 mg total) by nebulization 4 (four) times daily as needed for wheezing or shortness of breath.     lisinopril-hydrochlorothiazide 10-12.5 MG tablet  Commonly known as:  PRINZIDE,ZESTORETIC  Take 1 tablet by mouth daily.     loratadine 10 MG tablet  Commonly known as:  CLARITIN  Take 1 tablet (10 mg total) by mouth daily.     metFORMIN 500 MG tablet  Commonly known as:  GLUCOPHAGE  Take 1 tablet (500 mg total) by mouth 2 (two) times daily with a meal.     mometasone-formoterol 200-5 MCG/ACT Aero  Commonly known as:  DULERA  Inhale 2 puffs into the lungs 2 (two) times daily.     omalizumab 150 MG injection  Commonly known as:  XOLAIR  Inject 150 mg into the skin every 14 (fourteen) days.     omeprazole 40 MG capsule  Commonly known as:  PRILOSEC  Take 1 capsule (40 mg total) by mouth daily.     predniSONE 20 MG tablet  Commonly known as:  DELTASONE  Take 4 pills daily for 3 days, 3 pills daily for 3 days, 2 pills daily for 1 week, 1 pill daily for 1 week, then  0.5 pill thereafter     sertraline 50 MG tablet  Commonly known as:  ZOLOFT  Take 1 tablet (50 mg total) by mouth daily.     theophylline 300 MG 12 hr tablet  Commonly known as:  THEODUR  Take 1 tablet (300 mg total) by mouth daily.        Disposition and follow-up:   [name] was [facility] in Good [facility].  At the hospital follow up visit please address:  1. Re-evaluate need for high doses of prednisone  2. Ensure compliance with all medications and inhalers  3. Try to get her a CPAP machine  4. Consider increasing pantoprazole dose to 64m twice daily given chronic steroid use and possible GERD contribution to asthma exacerbations  5. Consider starting bisphosphonate given chronic steroid use  Follow-up Appointments:     Follow-up Information    Follow up with KLoleta Chance MD. Go on 08/30/2015.   Specialty:  Internal Medicine   Why:  at 215 for hospital followup   Contact information:   [id number]     Discharge Instructions: Discharge Instructions    Call MD for:  persistant dizziness or  light-headedness    Complete by:  As directed      Call MD for:  temperature >100.4    Complete by:  As directed      Diet - low sodium heart healthy    Complete by:  As directed      Diet - low sodium heart healthy    Complete by:  As directed      For home use only DME continuous positive airway pressure (CPAP)    Complete by:  As directed   Patient has OSA or probable OSA:  Yes  Is the patient currently using CPAP in the home:  No  Date of face to face encounter:  08/24/2015  Settings:  5-10  Signs and symptoms of probable OSA  (select all that apply):   Moring headaches Witnessed apneas    CPAP supplies needed:  Mask, headgear, cushions, filters, heated tubing and water chamber     Increase activity slowly    Complete by:  As directed      Increase activity slowly    Complete by:  As  directed            Procedures Performed:  Dg Chest 2 View  08/14/2015  CLINICAL DATA:  Wheezing, cough, shortness of breath EXAM: CHEST  2 VIEW COMPARISON:  07/12/2015 FINDINGS: The heart size and mediastinal contours are within normal limits. Both lungs are clear. The visualized skeletal structures are unremarkable. IMPRESSION: No active cardiopulmonary disease. Electronically Signed   By: Kathreen Devoid   On: 08/14/2015 13:47   Ct Chest Wo Contrast  08/23/2015  CLINICAL DATA:  Shortness of Breath, persistent asthma EXAM: CT CHEST WITHOUT CONTRAST TECHNIQUE: Multidetector CT imaging of the chest was performed following the standard protocol without IV contrast. COMPARISON:  Chest x-ray 08/22/2015 FINDINGS: Mediastinum/Lymph Nodes: Central airways are patent. Images of the thoracic inlet are unremarkable. No mediastinal hematoma or adenopathy. Central pulmonary artery and thoracic aorta is unremarkable. Heart size within normal limits. No pericardial effusion. There is no hilar adenopathy on this unenhanced scan. Lungs/Pleura: There is no pleural thickening or pleural effusion. Images of lung parenchyma shows no acute infiltrate or pulmonary edema. No bronchiectasis. No focal consolidation. No pneumothorax. A single small emphysematous bulla is noted in right upper lobe centrally. Upper abdomen: The visualized unenhanced upper abdomen is unremarkable. No adrenal gland mass is noted. Musculoskeletal: Sagittal images of the spine shows no destructive bony lesions. Mild degenerative changes upper and lower thoracic spine. There are streaky artifacts from patient's large body habitus. IMPRESSION: 1. No acute infiltrate or pulmonary edema. No bronchiectasis. A single tiny emphysematous bulla is noted in right upper lobe centrally. 2. No mediastinal hematoma or adenopathy.  No hilar adenopathy. 3. Mild degenerative changes upper and lower thoracic spine. Electronically Signed   By: Lahoma Crocker M.D.   On:  08/23/2015 15:42   Dg Chest Portable 1 View  08/22/2015  CLINICAL DATA:  Shortness of breath EXAM: PORTABLE CHEST 1 VIEW COMPARISON:  08/14/2015 FINDINGS: The heart size and mediastinal contours are within normal limits. Both lungs are clear. The visualized skeletal structures are unremarkable. IMPRESSION: No active disease. Electronically Signed   By: Kerby Moors M.D.   On: 08/22/2015 19:13     Admission HPI:   Ms. Laretha Luepke is a 45 year old female with PMH of severe persistent asthma on chronic steroids, OSA, GERD, Depression, and Morbid Obesity who presents with wheezing and shortness of breath.   She was hospitalized for  the same from 4/17-4/18 and treated with albuterol nebulizer and discharged on a prednisone taper with resumption of home Dulera, Ipratropium nebulizers, and albuterol nebulizers.   She was seen by Pulmonology on 4/20 and noted to have continued wheezing. She receives Omalizumab injections every 2 weeks which was administered at that time and continued on Prednisone 60 mg with planned slow taper to maintenance 10 mg. Pulmonology is working on approval to switch to mepolizumab from omalizumab.   [facility] was [facility] and home dulera inhaler was increased to 200-12mg dose BID and Prednisone was increased back to 60 mg with plan to resume scheduled taper. Aspergillus antibody was drawn and she was also started on Prilosec for GERD.   Patient returns to the ED with continued wheezing and SOB with exertion. She says this is the worst flare up she has had and does not feel much change since her hospitalization. She reports using her Dulera twice a day, Albuterol/ipratropium nebulizers 4 times daily, and rescue Albuterol up to 15 times per day recently. She says her symptoms seem to have been more frequent since moving into a new apartment in December which contained mold. She says her breathing is worse with exertion and at night. She says she is able to sleep laying  flat and has previously used a CPAP machine, but lost it after she moved. She reports associated chills and headaches, but no fevers, productive cough, chest pain, rhinorrhea, orthopnea, peripheral edema, N/V/D/C, abdominal pain, or myalgias.  Patient is a former smoker of 1 PPD since age 45 quitting ~3 years ago. She denies EtOH or illicit drug use. She denies recent travel, sick contacts, or pets at home.  In the ED, patient was tachypneic with audible wheezing and increased work of breathing. She was given IV Solumedrol, Atrovent nebulizers, Continuous albuterol nebs, and magnesium. BNP and D-dimer were negative. CXR with consolidation or infiltration.  Hospital Course by problem list:   1. Severe persistent asthma with acute exacerbation: This is one of innumerable asthma exacerbation admissions; I think her main problem is her moldy living situation atop severe disease. I do believe she is compliant with all of her medications listed above as she can rattle off her regimen without a problem. On admission this go-round, she was oxygenating well on room air but was quite dyspneic, despite using all of her inhalers and prednisone 646mdaily. Her dyspnea improved with IV solumedrol and scheduled inhalers. We considered ABPA and ordered a chest CT; this did not show bronchiectasis nor changes concerning for ABPA. We also considered Churg-Strauss given her erythema nodosum, but her ESR/CRP were normal and did not have oral nor nasal lesions. I spoke to Dr. YoAnnamaria Bootsho recommended starting theophylline so we followed his recommendation. She was discharged home on prednisone 8041maily with a one-month taper back to 62m28mily, theophylline 300mg70mly, mometasone-fometerol inhaler, ipratropium nebulizer, albuterol nebulizer, and has follow-up with Dr. YoungAnnamaria Boots the end of June to continue her omalizumab injections. Given her increased prednisone dose, I also started her on metformin 500mg 54me daily to help  prevent her from developing diabetes. Social work was attempting to get her a CPAP machine for home as well.   Discharge Vitals:   BP 160/76 mmHg  Pulse 85  Temp(Src) 98 F (36.7 C) (Oral)  Resp 20  Ht _0  (1.676 m)  SpO2 98%  LMP 08/15/2015  Discharge Labs:  No results found. However, due to the size of the  patient record, not all encounters were searched. Please check Results Review for a complete set of results.  Signed: [name], MD 08/24/2015, 1:18 PM   Equipment Ordered on Discharge: CPAP machine

## 2015-08-24 NOTE — Progress Notes (Signed)
April [name] to be D/C'd Home per MD order.  Discussed prescriptions and follow up appointments with the patient. Medication list explained in detail. Pt verbalized understanding.    Medication List    STOP taking these medications        eszopiclone 2 MG Tabs tablet  Commonly known as:  LUNESTA      TAKE these medications        albuterol (2.5 MG/3ML) 0.083% nebulizer solution  Commonly known as:  PROVENTIL  Take 3 mLs (2.5 mg total) by nebulization every 6 (six) hours as needed for wheezing or shortness of breath.     albuterol 108 (90 Base) MCG/ACT inhaler  Commonly known as:  PROVENTIL HFA;VENTOLIN HFA  Inhale 1-2 puffs into the lungs every 4 (four) hours as needed for wheezing or shortness of breath.     ipratropium 0.02 % nebulizer solution  Commonly known as:  ATROVENT  Take 2.5 mLs (0.5 mg total) by nebulization 4 (four) times daily as needed for wheezing or shortness of breath.     lisinopril-hydrochlorothiazide 10-12.5 MG tablet  Commonly known as:  PRINZIDE,ZESTORETIC  Take 1 tablet by mouth daily.     loratadine 10 MG tablet  Commonly known as:  CLARITIN  Take 1 tablet (10 mg total) by mouth daily.     metFORMIN 500 MG tablet  Commonly known as:  GLUCOPHAGE  Take 1 tablet (500 mg total) by mouth 2 (two) times daily with a meal.     mometasone-formoterol 200-5 MCG/ACT Aero  Commonly known as:  DULERA  Inhale 2 puffs into the lungs 2 (two) times daily.     omalizumab 150 MG injection  Commonly known as:  XOLAIR  Inject 150 mg into the skin every 14 (fourteen) days.     omeprazole 40 MG capsule  Commonly known as:  PRILOSEC  Take 1 capsule (40 mg total) by mouth daily.     predniSONE 20 MG tablet  Commonly known as:  DELTASONE  Take 4 pills daily for 3 days, 3 pills daily for 3 days, 2 pills daily for 1 week, 1 pill daily for 1 week, then 0.5 pill thereafter     sertraline 50 MG tablet  Commonly known as:  ZOLOFT  Take 1 tablet (50 mg total) by mouth  daily.     theophylline 300 MG 12 hr tablet  Commonly known as:  THEODUR  Take 1 tablet (300 mg total) by mouth daily.        Filed Vitals:   08/24/15 0848 08/24/15 1052  BP: 179/93 160/76  Pulse:    Temp:    Resp:      Skin clean, dry and intact without evidence of skin break down, no evidence of skin tears noted. IV catheter discontinued intact. Site without signs and symptoms of complications. Dressing and pressure applied. Pt denies pain at this time. No complaints noted.  An After Visit Summary was printed and given to the patient. Patient escorted via [place], and D/C home via private auto.  [name] 08/24/2015 2:47 PM

## 2015-08-24 NOTE — Clinical Documentation Improvement (Signed)
Internal Medicine  Please clarify if your patient is being treated for "Asthma Exacerbation in COPD" one of ED's diagnoses or is only being treated for Asthma Exacerbation atop Severe Persistent Asthma. I am asking this query as COPD and Asthma have two different DRG's.    Other  Clinically Undetermined  Supporting Information:  Patient with PMH of smoking; requesting  COPD (chronic obstructive pulmonary disease) ([place]) PFTs in 2002, FEV1/FVC 65, no post bronchodilater test done   Requesting nicotine patch for her urges  Please exercise your independent, professional judgment when responding. A specific answer is not anticipated or expected.  Thank You, Zoila Shutter RN, BSN, [place] [phone]; Cell: [phone]

## 2015-08-25 LAB — ASPERGILLUS ANTIBODY BY IMMUNODIFF
ASPERGILLUS FLAVUS: NEGATIVE
Aspergillus fumigatus, IgG: NEGATIVE
Aspergillus niger: NEGATIVE

## 2015-08-29 ENCOUNTER — Telehealth: Payer: Self-pay | Admitting: Internal Medicine

## 2015-08-29 NOTE — Telephone Encounter (Signed)
APPT. REMINDER CALL, LMTCB °

## 2015-08-30 ENCOUNTER — Ambulatory Visit (INDEPENDENT_AMBULATORY_CARE_PROVIDER_SITE_OTHER): Payer: Self-pay | Admitting: Internal Medicine

## 2015-08-30 ENCOUNTER — Encounter: Payer: Self-pay | Admitting: Internal Medicine

## 2015-08-30 VITALS — BP 159/92 | HR 77 | Temp 98.2°F | Wt 333.3 lb

## 2015-08-30 DIAGNOSIS — J45901 Unspecified asthma with (acute) exacerbation: Secondary | ICD-10-CM

## 2015-08-30 DIAGNOSIS — F32A Depression, unspecified: Secondary | ICD-10-CM | POA: Insufficient documentation

## 2015-08-30 DIAGNOSIS — F411 Generalized anxiety disorder: Secondary | ICD-10-CM | POA: Insufficient documentation

## 2015-08-30 DIAGNOSIS — F329 Major depressive disorder, single episode, unspecified: Secondary | ICD-10-CM | POA: Insufficient documentation

## 2015-08-30 DIAGNOSIS — J4550 Severe persistent asthma, uncomplicated: Secondary | ICD-10-CM

## 2015-08-30 DIAGNOSIS — M25562 Pain in left knee: Secondary | ICD-10-CM

## 2015-08-30 DIAGNOSIS — Z6841 Body Mass Index (BMI) 40.0 and over, adult: Secondary | ICD-10-CM

## 2015-08-30 DIAGNOSIS — I10 Essential (primary) hypertension: Secondary | ICD-10-CM

## 2015-08-30 DIAGNOSIS — K219 Gastro-esophageal reflux disease without esophagitis: Secondary | ICD-10-CM

## 2015-08-30 DIAGNOSIS — F419 Anxiety disorder, unspecified: Secondary | ICD-10-CM

## 2015-08-30 DIAGNOSIS — J449 Chronic obstructive pulmonary disease, unspecified: Secondary | ICD-10-CM

## 2015-08-30 DIAGNOSIS — M25561 Pain in right knee: Secondary | ICD-10-CM

## 2015-08-30 DIAGNOSIS — J4489 Other specified chronic obstructive pulmonary disease: Secondary | ICD-10-CM

## 2015-08-30 MED ORDER — PREDNISONE 20 MG PO TABS: ORAL_TABLET | ORAL | Status: DC

## 2015-08-30 MED ORDER — ESOMEPRAZOLE MAGNESIUM 40 MG PO CPDR: 40.0000 mg | DELAYED_RELEASE_CAPSULE | Freq: Every day | ORAL | Status: DC

## 2015-08-30 MED ORDER — ACETAMINOPHEN 500 MG PO TABS: 1000.0000 mg | ORAL_TABLET | Freq: Four times a day (QID) | ORAL | Status: DC | PRN

## 2015-08-30 MED ORDER — IPRATROPIUM BROMIDE 0.02 % IN SOLN: 0.5000 mg | Freq: Four times a day (QID) | RESPIRATORY_TRACT | Status: DC | PRN

## 2015-08-30 MED ORDER — LORATADINE 10 MG PO TABS: 10.0000 mg | ORAL_TABLET | Freq: Every day | ORAL | Status: DC

## 2015-08-30 MED ORDER — ALBUTEROL SULFATE HFA 108 (90 BASE) MCG/ACT IN AERS: 1.0000 | INHALATION_SPRAY | RESPIRATORY_TRACT | Status: DC | PRN

## 2015-08-30 MED ORDER — MOMETASONE FURO-FORMOTEROL FUM 200-5 MCG/ACT IN AERO: 2.0000 | INHALATION_SPRAY | Freq: Two times a day (BID) | RESPIRATORY_TRACT | Status: DC

## 2015-08-30 MED ORDER — SERTRALINE HCL 50 MG PO TABS: 50.0000 mg | ORAL_TABLET | Freq: Every day | ORAL | Status: DC

## 2015-08-30 MED ORDER — HYDROCHLOROTHIAZIDE 25 MG PO TABS
25.0000 mg | ORAL_TABLET | Freq: Every day | ORAL | Status: DC
Start: 2015-08-30 — End: 2015-12-27

## 2015-08-30 MED ORDER — ALBUTEROL SULFATE (2.5 MG/3ML) 0.083% IN NEBU: 2.5000 mg | INHALATION_SOLUTION | Freq: Four times a day (QID) | RESPIRATORY_TRACT | Status: DC | PRN

## 2015-08-30 MED ORDER — THEOPHYLLINE ER 300 MG PO TB12: 300.0000 mg | ORAL_TABLET | Freq: Every day | ORAL | Status: DC

## 2015-08-30 MED ORDER — LIRAGLUTIDE -WEIGHT MANAGEMENT 18 MG/3ML ~~LOC~~ SOPN: 0.6000 mg | PEN_INJECTOR | Freq: Every day | SUBCUTANEOUS | Status: DC

## 2015-08-30 NOTE — Assessment & Plan Note (Signed)
She is complaining of bilateral knee pain likely from osteoarthritis and is requesting Percocet. I told her I will not prescribe her opiates but encouraged her to continue trying to lose weight. I've also prescribed her acetaminophen 1g every 6 hours as needed for pain. We can offer her steroid injection at the next visit if she would like.

## 2015-08-30 NOTE — Progress Notes (Signed)
Patient ID: April [name], female   DOB: 09-01-1970, 45 y.o.   MRN: [id number] [facility] Subjective:   Patient ID: April [name] female   DOB: 01/30/1971 45 y.o.   MRN: [id number]  HPI: April [name] is a 45 y.o. female with severe persistent asthma, obstructive sleep apnea, and morbid obesity presenting to clinic for a hospital follow-up for asthma exacerbation, evaluation for weight loss, and hypertension.  Hospital follow-up for asthma exacerbation: She was admitted frmo 4/25 to 4/27 for an asthma exacerbation despite taking prednisone '60mg'$  daily and compliance with her inhalers. Her asthma improved briskly with scheduled albuterol nebulizers and Solumedrol injections. Because she has such significant disease, we considered ABPA and Churg-Strauss, but chest CT did not show bronchiectatic changes and ESR/CRP were normal. Her pulmonologist, Dr. Annamaria Boots, recommended starting theophyllin '300mg'$  daily so this was started, in addition to a higher prednisone dose of '80mg'$  daily, in addition to her mometasone-fometerol inhaler, ipratropium nebulizer, and albuterol nebulizer.  Since her hospitalization, she has self-tapered her prednisone to '40mg'$  daily without any wheezing or shortness of breath. She needs refills on all of her inhalers because she is about to run out. She has follow-up with Dr. Annamaria Boots in May 23.  Evaluation for weight loss: She feels losing weight is a struggle because she is on chronic prednisone. She's eliminated sodas from her diet but would like a medication to help.  Hypertension: She had stopped taking her lisinopril upon leaving the hospital per my recommendation. She's not been taking any antihypertensives for the last week.  She is not smoking and I have reviewed her medications with her today.  Review of Systems  Constitutional: Negative for fever, chills and malaise/fatigue.  Respiratory: Negative for cough, shortness of breath and wheezing.    Cardiovascular: Negative for chest pain, palpitations, orthopnea and leg swelling.  Neurological: Negative for dizziness and headaches.  Psychiatric/Behavioral: Positive for depression. Negative for suicidal ideas. The patient is nervous/anxious.     Objective:  Physical Exam: Filed Vitals:   08/30/15 1437  BP: 159/92  Pulse: 77  Temp: 98.2 F (36.8 C)  TempSrc: Oral  Weight: 333 lb 4.8 oz (151.184 kg)  SpO2: 100%   General: friendly overweight black lady resting in chair comfortably, appropriately conversational HEENT: no scleral icterus, extra-ocular muscles intact, oropharynx without lesions Cardiac: regular rate and rhythm, no rubs, murmurs or gallops Pulm: breathing well, clear to auscultation bilaterally Abd: bowel sounds normal, soft, nondistended, non-tender Ext: warm and well perfused, without pedal edema  Assessment & Plan:  Case discussed with Dr. Eppie Gibson  Asthma, chronic obstructive, without status asthmaticus ([place]) She has severe persistent asthma, obesity hypoventilation syndrome, and obstructive sleep apnea, and is frequently admitted to our inpatient service. In hopes of preventing future admissions, today I've made the following medication adjustments:  1. She's currently taking prednisone '40mg'$  daily. I've given her a taper schedule of alternating '40mg'$  and '30mg'$  every other day for one week, then '30mg'$  daily for 1 week, then 20/'30mg'$  for 1 week, then '20mg'$  therafter. Hopefully she will be taking '20mg'$  daily by the time she sees Dr. Annamaria Boots on May 23rd and he can further down-titrate from there. Getting her off of prednisone would be a huge accomplishment.  2. To help her lose weight, and thus help her OHS and OSA, as well as curb the onset of diabetes given her chronic prednisone use, I've started liraglutide today. I've asked her to gradually up-titrate this to '3mg'$  daily. She  has cut out sodas and is trying to exercise. At the next visit, I'll refer her to Ms. Butch Penny Plyler  for nutritional education.  3. I've stopped her lisinopril, as I was slightly concerned this could be contributing to her reactive airway disease. In lieu, I've started hydrochlorothiazide '25mg'$  daily.  4. She complains of dyspepsia, so I've started Nexium to help prevent reflux that may be contributing to her reactive airway disease.  5. I've re-filled all of her medications and inhalers today, including theophylline '300mg'$  daily, Dulera twice daily, albuterol nebulizer and inhalers, and ipratropium nebulizer.  She is going to get her omalizumab injection today, and has follow-up with Dr. Annamaria Boots on May 23rd  At the next visit, I would like to refer her for another sleep study and help get her a CPAP machine. I'd also like to refer her to Debera Lat for nutritional education.  Generalized anxiety disorder I've refilled her sertraline '50mg'$  daily today.  Morbid obesity with body mass index of 50.0-59.9 in adult [facility]) Per my asthma note, I've started liraglutide today, she has cut out sodas and is working on cutting out carbohydrates. At the next visit, I will place a referral to our excellent nutritionist, Debera Lat.  Knee pain, bilateral She is complaining of bilateral knee pain likely from osteoarthritis and is requesting Percocet. I told her I will not prescribe her opiates but encouraged her to continue trying to lose weight. I've also prescribed her acetaminophen 1g every 6 hours as needed for pain. We can offer her steroid injection at the next visit if she would like.  Essential hypertension She was hypertensive today to 160/90 on no medications, so I've started hydrochlorothiazide '25mg'$  daily. We'll try to avoid ACE inhibitors for the time-being to prevent worsening her asthma.   Medications Ordered Meds ordered this encounter  Medications  . esomeprazole (NEXIUM) 40 MG capsule    Sig: Take 1 capsule (40 mg total) by mouth daily.    Dispense:  90 capsule    Refill:  3  .  mometasone-formoterol (DULERA) 200-5 MCG/ACT AERO    Sig: Inhale 2 puffs into the lungs 2 (two) times daily.    Dispense:  3 Inhaler    Refill:  5  . theophylline (THEODUR) 300 MG 12 hr tablet    Sig: Take 1 tablet (300 mg total) by mouth daily.    Dispense:  90 tablet    Refill:  1  . sertraline (ZOLOFT) 50 MG tablet    Sig: Take 1 tablet (50 mg total) by mouth daily.    Dispense:  90 tablet    Refill:  3  . loratadine (CLARITIN) 10 MG tablet    Sig: Take 1 tablet (10 mg total) by mouth daily.    Dispense:  90 tablet    Refill:  3  . ipratropium (ATROVENT) 0.02 % nebulizer solution    Sig: Take 2.5 mLs (0.5 mg total) by nebulization 4 (four) times daily as needed for wheezing or shortness of breath.    Dispense:  75 mL    Refill:  2  . albuterol (PROVENTIL) (2.5 MG/3ML) 0.083% nebulizer solution    Sig: Take 3 mLs (2.5 mg total) by nebulization every 6 (six) hours as needed for wheezing or shortness of breath.    Dispense:  150 mL    Refill:  1  . albuterol (PROVENTIL HFA;VENTOLIN HFA) 108 (90 Base) MCG/ACT inhaler    Sig: Inhale 1-2 puffs into the lungs every 4 (four) hours  as needed for wheezing or shortness of breath.    Dispense:  6 Inhaler    Refill:  5  . Liraglutide -Weight Management 18 MG/3ML SOPN    Sig: Inject 0.6 mg into the skin daily. For one week, then 1.'2mg'$  daily for one week, then 1.'8mg'$  daily for 1 week, then 2.'4mg'$  daily for 1 week, then '3mg'$  daily thereafter    Dispense:  6 pen    Refill:  1  . hydrochlorothiazide (HYDRODIURIL) 25 MG tablet    Sig: Take 1 tablet (25 mg total) by mouth daily.    Dispense:  90 tablet    Refill:  3  . predniSONE (DELTASONE) 20 MG tablet    Sig: Take 2 pills daily for 1 week, alternate 2 and 1.5 pills daily for 1 week, take 1.5 pills daily for 1 week, alternate 1.5 and 1 pills daily for 1 week, then take 1 pill daily    Dispense:  180 tablet    Refill:  1  . DISCONTD: acetaminophen (TYLENOL) 500 MG tablet    Sig: Take 2 tablets  (1,000 mg total) by mouth every 6 (six) hours as needed.    Dispense:  90 tablet    Refill:  3  . acetaminophen (TYLENOL) 500 MG tablet    Sig: Take 2 tablets (1,000 mg total) by mouth every 6 (six) hours as needed.    Dispense:  90 tablet    Refill:  3    Follow Up: Return in about 4 weeks (around 09/27/2015).

## 2015-08-30 NOTE — Assessment & Plan Note (Addendum)
She has severe persistent asthma, obesity hypoventilation syndrome, and obstructive sleep apnea, and is frequently admitted to our inpatient service. In hopes of preventing future admissions, today I've made the following medication adjustments:  1. She's currently taking prednisone 40mg  daily. I've given her a taper schedule of alternating 40mg  and 30mg  every other day for one week, then 30mg  daily for 1 week, then 20/30mg  for 1 week, then 20mg  therafter. Hopefully she will be taking 20mg  daily by the time she sees Dr. Annamaria Boots on May 23rd and he can further down-titrate from there. Getting her off of prednisone would be a huge accomplishment.  2. To help her lose weight, and thus help her OHS and OSA, as well as curb the onset of diabetes given her chronic prednisone use, I've started liraglutide today. I've asked her to gradually up-titrate this to 3mg  daily. She has cut out sodas and is trying to exercise. At the next visit, I'll refer her to Ms. Butch Penny Plyler for nutritional education.  3. I've stopped her lisinopril, as I was slightly concerned this could be contributing to her reactive airway disease. In lieu, I've started hydrochlorothiazide 25mg  daily.  4. She complains of dyspepsia, so I've started Nexium to help prevent reflux that may be contributing to her reactive airway disease.  5. I've re-filled all of her medications and inhalers today, including theophylline 300mg  daily, Dulera twice daily, albuterol nebulizer and inhalers, and ipratropium nebulizer.  She is going to get her omalizumab injection today, and has follow-up with Dr. Annamaria Boots on May 23rd  At the next visit, I would like to refer her for another sleep study and help get her a CPAP machine. I'd also like to refer her to Debera Lat for nutritional education.

## 2015-08-30 NOTE — Assessment & Plan Note (Signed)
She was hypertensive today to 160/90 on no medications, so I've started hydrochlorothiazide 25mg  daily. We'll try to avoid ACE inhibitors for the time-being to prevent worsening her asthma.

## 2015-08-30 NOTE — Progress Notes (Signed)
Case discussed with Dr. Melburn Hake at time of visit.  We reviewed the resident's history and exam and pertinent patient test results.  I agree with the assessment, diagnosis, and plan of care documented in the resident's note.

## 2015-08-30 NOTE — Assessment & Plan Note (Signed)
Per my asthma note, I've started liraglutide today, she has cut out sodas and is working on cutting out carbohydrates. At the next visit, I will place a referral to our excellent nutritionist, Debera Lat.

## 2015-08-30 NOTE — Assessment & Plan Note (Signed)
I've refilled her sertraline 50mg  daily today.

## 2015-09-10 IMAGING — DX DG CHEST 2V
2 series · 2 of 2 positions shown · non-contrast
Comparison: PA and lateral chest 06/06/2014 03/27/2014.

CLINICAL DATA: Shortness of breath and cough.

EXAM:
CHEST  2 VIEW

[chest pa]
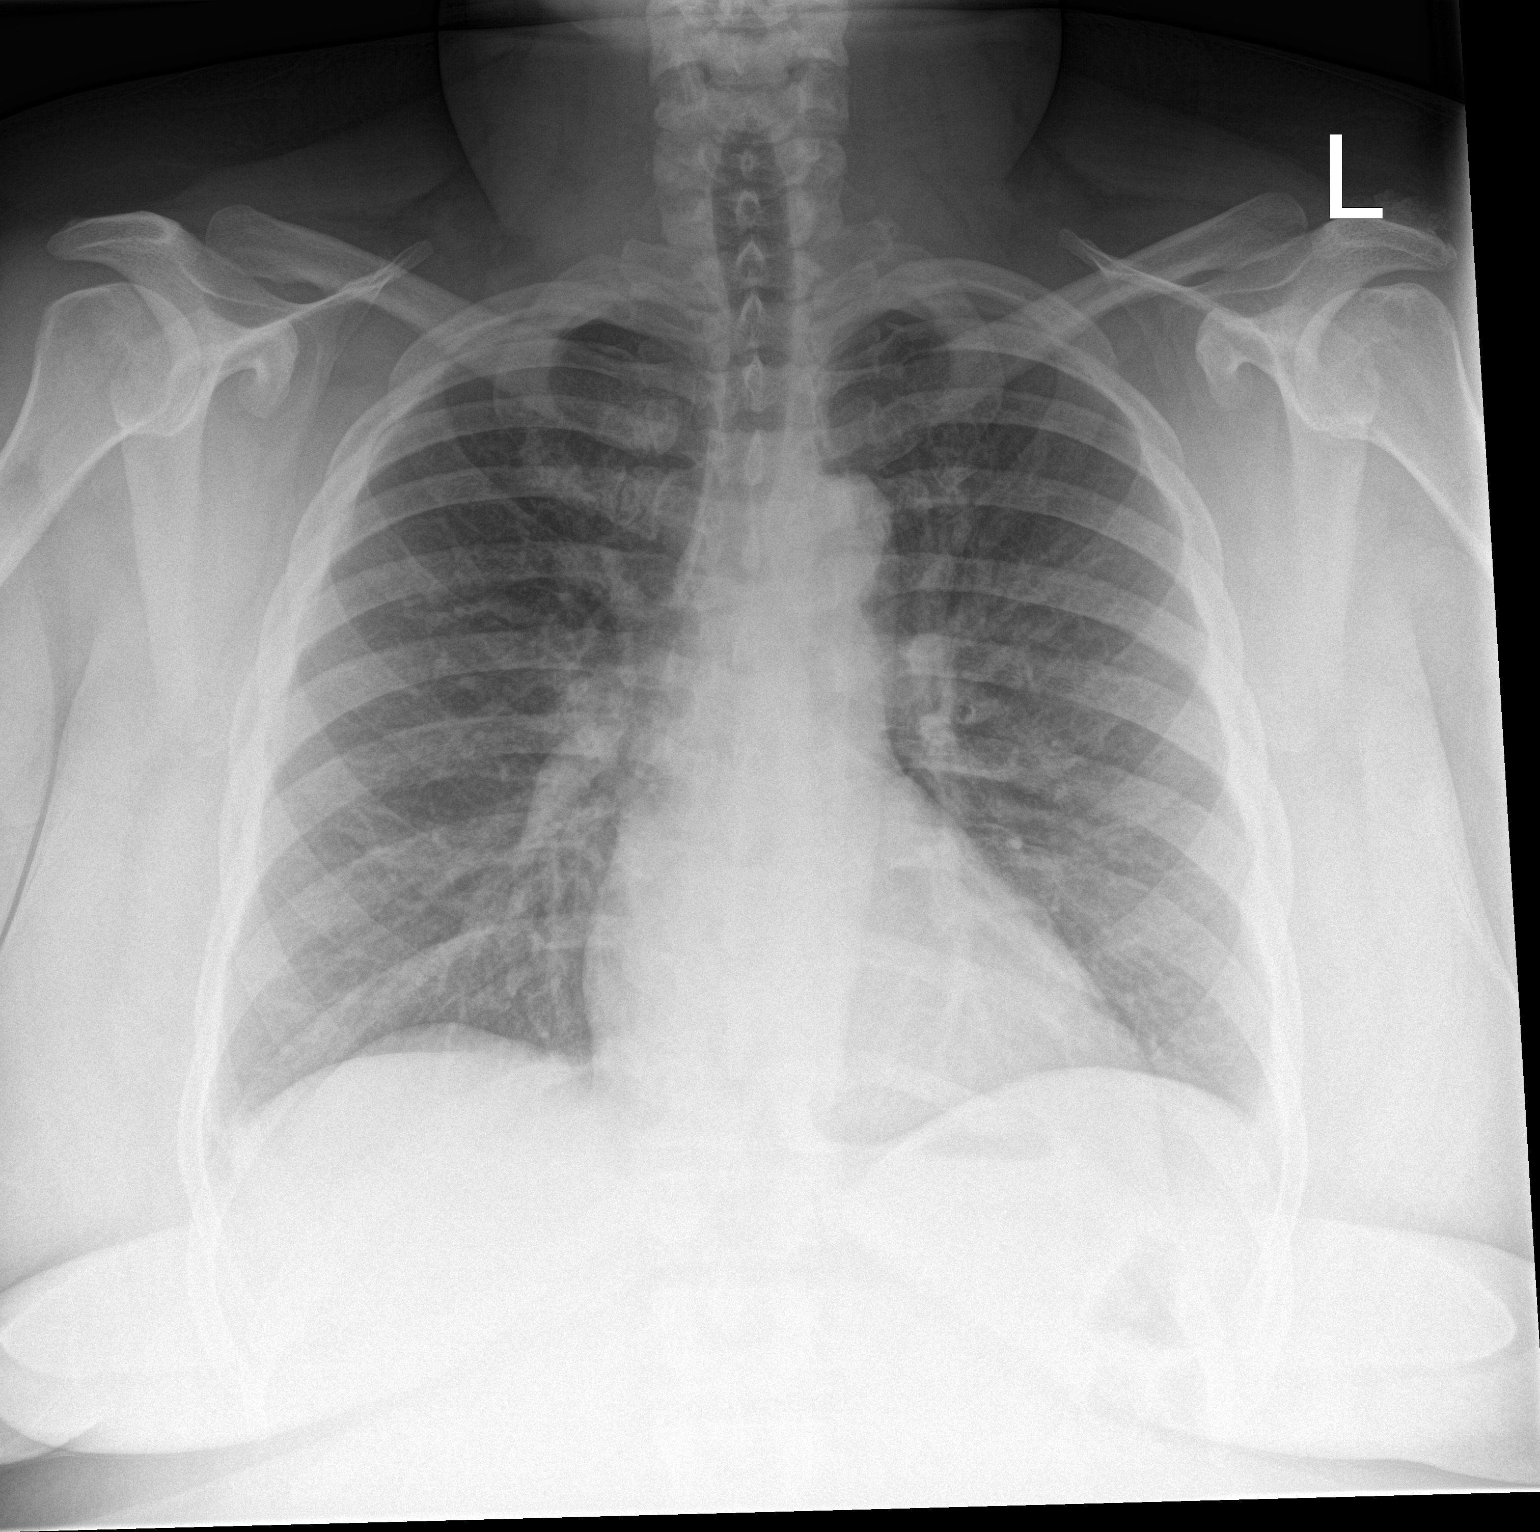

[chest lat]
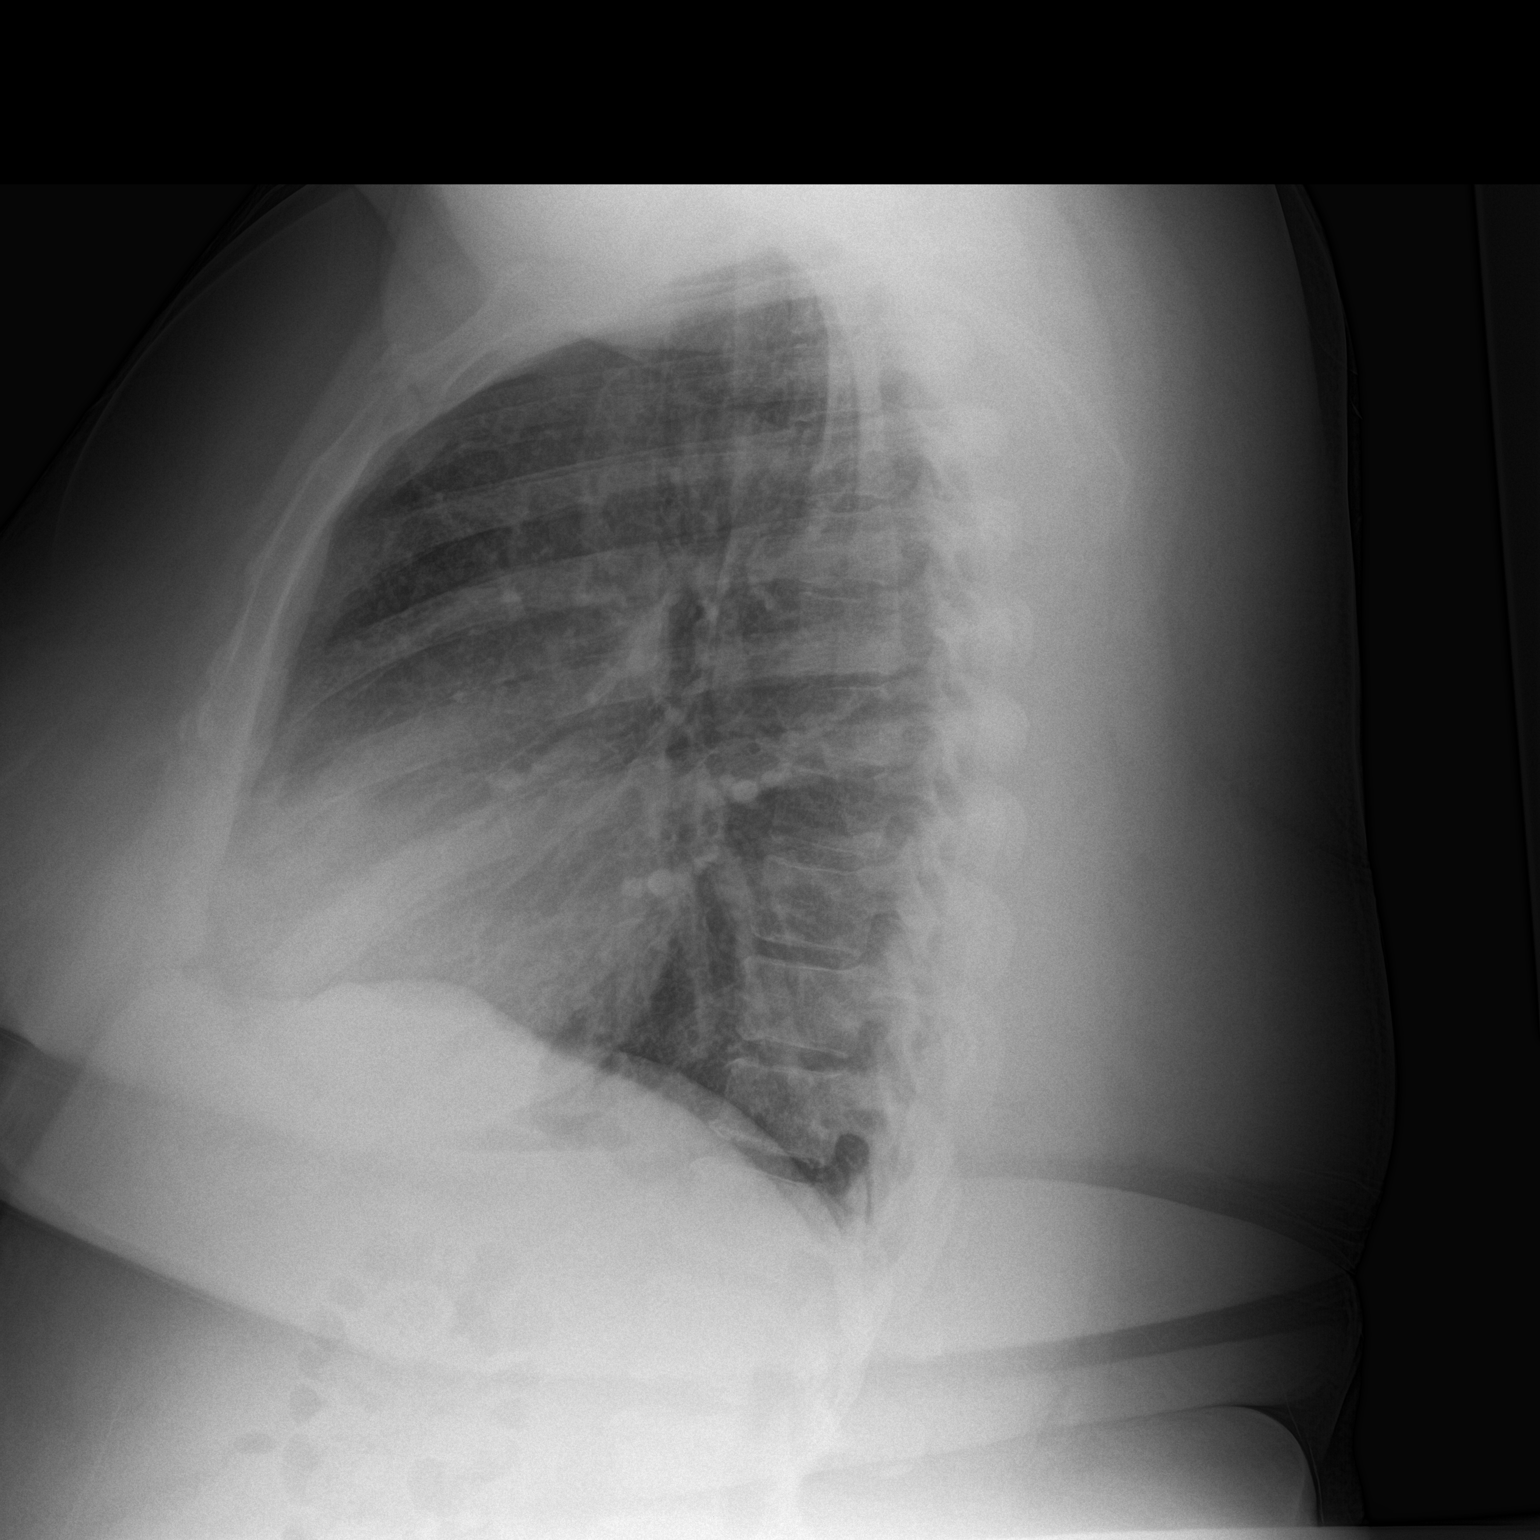

[2 of 2 positions shown; findings below may reference images not displayed]

FINDINGS: The lungs are clear. Heart size is normal. No pneumothorax or
pleural effusion is identified. No focal bony abnormality is seen.
IMPRESSION: Negative chest.

## 2015-09-14 ENCOUNTER — Encounter (HOSPITAL_COMMUNITY): Payer: Self-pay | Admitting: Emergency Medicine

## 2015-09-14 ENCOUNTER — Ambulatory Visit (HOSPITAL_COMMUNITY)
Admission: EM | Admit: 2015-09-14 | Discharge: 2015-09-14 | Disposition: A | Discharge status: Home / Self Care | Payer: Self-pay | Attending: Emergency Medicine | Admitting: Emergency Medicine

## 2015-09-14 ENCOUNTER — Ambulatory Visit (INDEPENDENT_AMBULATORY_CARE_PROVIDER_SITE_OTHER): Payer: Self-pay

## 2015-09-14 DIAGNOSIS — M1712 Unilateral primary osteoarthritis, left knee: Secondary | ICD-10-CM

## 2015-09-14 DIAGNOSIS — M25562 Pain in left knee: Secondary | ICD-10-CM

## 2015-09-14 MED ORDER — METHYLPREDNISOLONE ACETATE 40 MG/ML IJ SUSP
INTRAMUSCULAR | Status: AC
  Filled 2015-09-14: qty 1

## 2015-09-14 MED ORDER — MELOXICAM 7.5 MG PO TABS: 7.5000 mg | ORAL_TABLET | Freq: Every day | ORAL | Status: DC

## 2015-09-14 MED ORDER — HYDROCODONE-ACETAMINOPHEN 5-325 MG PO TABS: 1.0000 | ORAL_TABLET | Freq: Four times a day (QID) | ORAL | Status: DC | PRN

## 2015-09-14 MED ORDER — LIDOCAINE HCL (PF) 2 % IJ SOLN
INTRAMUSCULAR | Status: AC
  Filled 2015-09-14: qty 2

## 2015-09-14 NOTE — Discharge Instructions (Signed)
Your pain is coming from arthritis in your knee. We did a cortisone injection today. Please ice your knee at least twice a day. Take meloxicam daily for the next week, then as needed for pain. Use the hydrocodone every 6 hours as needed for severe pain. Stays active as you can. Follow-up with your primary care doctor as needed.

## 2015-09-14 NOTE — ED Notes (Signed)
Left knee pain, no known injury.  Pain since 5/16

## 2015-09-14 NOTE — ED Provider Notes (Signed)
CSN: [id number]     Arrival date & time 09/14/15  1544 History   First MD Initiated Contact with Patient 09/14/15 1626     Chief Complaint  Patient presents with  . Knee Pain   (Consider location/radiation/quality/duration/timing/severity/associated sxs/prior Treatment) HPI  She is a 45 year old woman here for evaluation of left knee pain. She states this started 2 days ago when she got out of bed. The pain is located in the distal quadriceps and goes down across the front of her knee. It is worse with flexion and extension of the knee. It is also worse with weightbearing. She is able to walk, but with pain. She denies any injury or trauma. She does not remember twisting her knee or falling. No locking or clicking. No swelling.  Past Medical History  Diagnosis Date  . Acanthosis nigricans   . Menorrhagia   . Hypertension, essential   . Insomnia   . Morbid obesity ([place])   . Helicobacter pylori (H. pylori) infection   . Sleep apnea     Sleep study 2008 - mild OSA, not enough events to titrate CPAP  . COPD (chronic obstructive pulmonary disease) ([place])     PFTs in 2002, FEV1/FVC 65, no post bronchodilater test done  . Asthma     Followed by Dr. Annamaria Boots (pulmonology); receives every other week omalizumab injections; has frequent exacerbations  . Depression   . GERD (gastroesophageal reflux disease)   . Tobacco user   . Obesity   . Shortness of breath   . Headache(784.0)   . Seasonal allergies   . Arthritis    Past Surgical History  Procedure Laterality Date  . Tubal ligation  1996    bilateral  . Breast reduction surgery  09/2011   Family History  Problem Relation Age of Onset  . Asthma Daughter   . Cancer Paternal Aunt   . Asthma Maternal Grandmother   . Hypertension Mother    Social History  Substance Use Topics  . Smoking status: Former Smoker -- 18 years    Types: Cigarettes    Quit date: 09/15/2014  . Smokeless tobacco: Former Systems developer    Quit date: 09/12/2014  .  Alcohol Use: No   OB History    No data available     Review of Systems As in history of present illness Allergies  Review of patient's allergies indicates no known allergies.  Home Medications   Prior to Admission medications   Medication Sig Start Date End Date Taking? Authorizing Provider  acetaminophen (TYLENOL) 500 MG tablet Take 2 tablets (1,000 mg total) by mouth every 6 (six) hours as needed. 08/30/15   [name], MD  albuterol (PROVENTIL HFA;VENTOLIN HFA) 108 (90 Base) MCG/ACT inhaler Inhale 1-2 puffs into the lungs every 4 (four) hours as needed for wheezing or shortness of breath. 08/30/15   [name], MD  albuterol (PROVENTIL) (2.5 MG/3ML) 0.083% nebulizer solution Take 3 mLs (2.5 mg total) by nebulization every 6 (six) hours as needed for wheezing or shortness of breath. 08/30/15   [name], MD  esomeprazole (NEXIUM) 40 MG capsule Take 1 capsule (40 mg total) by mouth daily. 08/30/15 08/29/16  [name], MD  hydrochlorothiazide (HYDRODIURIL) 25 MG tablet Take 1 tablet (25 mg total) by mouth daily. 08/30/15   [name], MD  HYDROcodone-acetaminophen (NORCO) 5-325 MG tablet Take 1 tablet by mouth every 6 (six) hours as needed for severe pain. 09/14/15   Melony Overly, MD  ipratropium (ATROVENT) 0.02 % nebulizer  solution Take 2.5 mLs (0.5 mg total) by nebulization 4 (four) times daily as needed for wheezing or shortness of breath. 08/30/15   [name], MD  Liraglutide -Weight Management 18 MG/3ML SOPN Inject 0.6 mg into the skin daily. For one week, then 1.2mg  daily for one week, then 1.8mg  daily for 1 week, then 2.4mg  daily for 1 week, then 3mg  daily thereafter 08/30/15   [name], MD  loratadine (CLARITIN) 10 MG tablet Take 1 tablet (10 mg total) by mouth daily. 08/30/15   [name], MD  meloxicam (MOBIC) 7.5 MG tablet Take 1 tablet (7.5 mg total) by mouth daily. 09/14/15   Melony Overly, MD  mometasone-formoterol (DULERA) 200-5 MCG/ACT AERO Inhale 2 puffs into the lungs 2 (two)  times daily. 08/30/15   [name], MD  omalizumab Arvid Right) 150 MG injection Inject 150 mg into the skin every 14 (fourteen) days.    Historical Provider, MD  predniSONE (DELTASONE) 20 MG tablet Take 2 pills daily for 1 week, alternate 2 and 1.5 pills daily for 1 week, take 1.5 pills daily for 1 week, alternate 1.5 and 1 pills daily for 1 week, then take 1 pill daily 08/30/15   [name], MD  sertraline (ZOLOFT) 50 MG tablet Take 1 tablet (50 mg total) by mouth daily. 08/30/15   [name], MD  theophylline (THEODUR) 300 MG 12 hr tablet Take 1 tablet (300 mg total) by mouth daily. 08/30/15   [name], MD   Meds Ordered and Administered this Visit  Medications - No data to display  BP 145/84 mmHg  Pulse 97  Temp(Src) 98.1 F (36.7 C) (Oral)  Resp 16  SpO2 96%  LMP 09/14/2015 No data found.   Physical Exam  Constitutional: She is oriented to person, place, and time. She appears well-developed and well-nourished. No distress.  Morbidly obese  Cardiovascular: Normal rate.   Pulmonary/Chest: Effort normal.  Musculoskeletal:  Left knee: No erythema or edema. She has full active range of motion, but with pain. She is tender over the quadriceps tendon and distal quads. She is also tender along the medial and lateral joint lines. No joint laxity. No appreciable joint effusion, but body habitus does make exam somewhat difficult.  Neurological: She is alert and oriented to person, place, and time.    ED Course  Injection of joint Date/Time: 09/14/2015 5:37 PM Performed by: Melony Overly Authorized by: Melony Overly Consent: Verbal consent obtained. Risks and benefits: risks, benefits and alternatives were discussed Consent given by: patient Patient understanding: patient states understanding of the procedure being performed Patient identity confirmed: verbally with patient Time out: Immediately prior to procedure a "time out" was called to verify the correct patient, procedure, equipment,  support staff and site/side marked as required. Comments: Skin was prepped with alcohol. Cold spray was used for anesthetic. 40 mg of Depo-Medrol with 4 mL of 2% lidocaine was injected in the left knee. Patient tolerated procedure well with no immediate complication.   (including critical care time)  Labs Review Labs Reviewed - No data to display  Imaging Review Dg Knee Complete 4 Views Left  09/14/2015  CLINICAL DATA:  LEFT knee pain for 2 days, no injury EXAM: LEFT KNEE - COMPLETE 4+ VIEW COMPARISON:  03/13/2008 FINDINGS: Tricompartmental osteoarthritic changes with joint space narrowing and spur formation. Osseous mineralization normal. No acute fracture, dislocation or bone destruction. Obliquity on lateral view. Soft tissues unremarkable. IMPRESSION: Tricompartmental osteoarthritic changes mildly progressive since 2009 Electronically Signed  By: Lavonia Dana M.D.   On: 09/14/2015 17:16     MDM   1. Left knee pain   2. Primary osteoarthritis of left knee    Cortisone injection done today. Recommended ice and meloxicam. Prescription given for hydrocodone to use as needed for severe pain. Discussed minimizing use of this medicine. Follow-up with PCP.    Melony Overly, MD 09/14/15 [phone]

## 2015-09-19 ENCOUNTER — Ambulatory Visit: Payer: Self-pay | Admitting: Internal Medicine

## 2015-09-21 ENCOUNTER — Ambulatory Visit: Payer: Self-pay | Admitting: Internal Medicine

## 2015-09-24 IMAGING — DX DG CHEST 2V
2 series · 2 of 2 positions shown · non-contrast
Comparison: 08/25/2014

CLINICAL DATA: Asthma.  Dyspnea and wheezing

EXAM:
CHEST  2 VIEW

[chest pa]
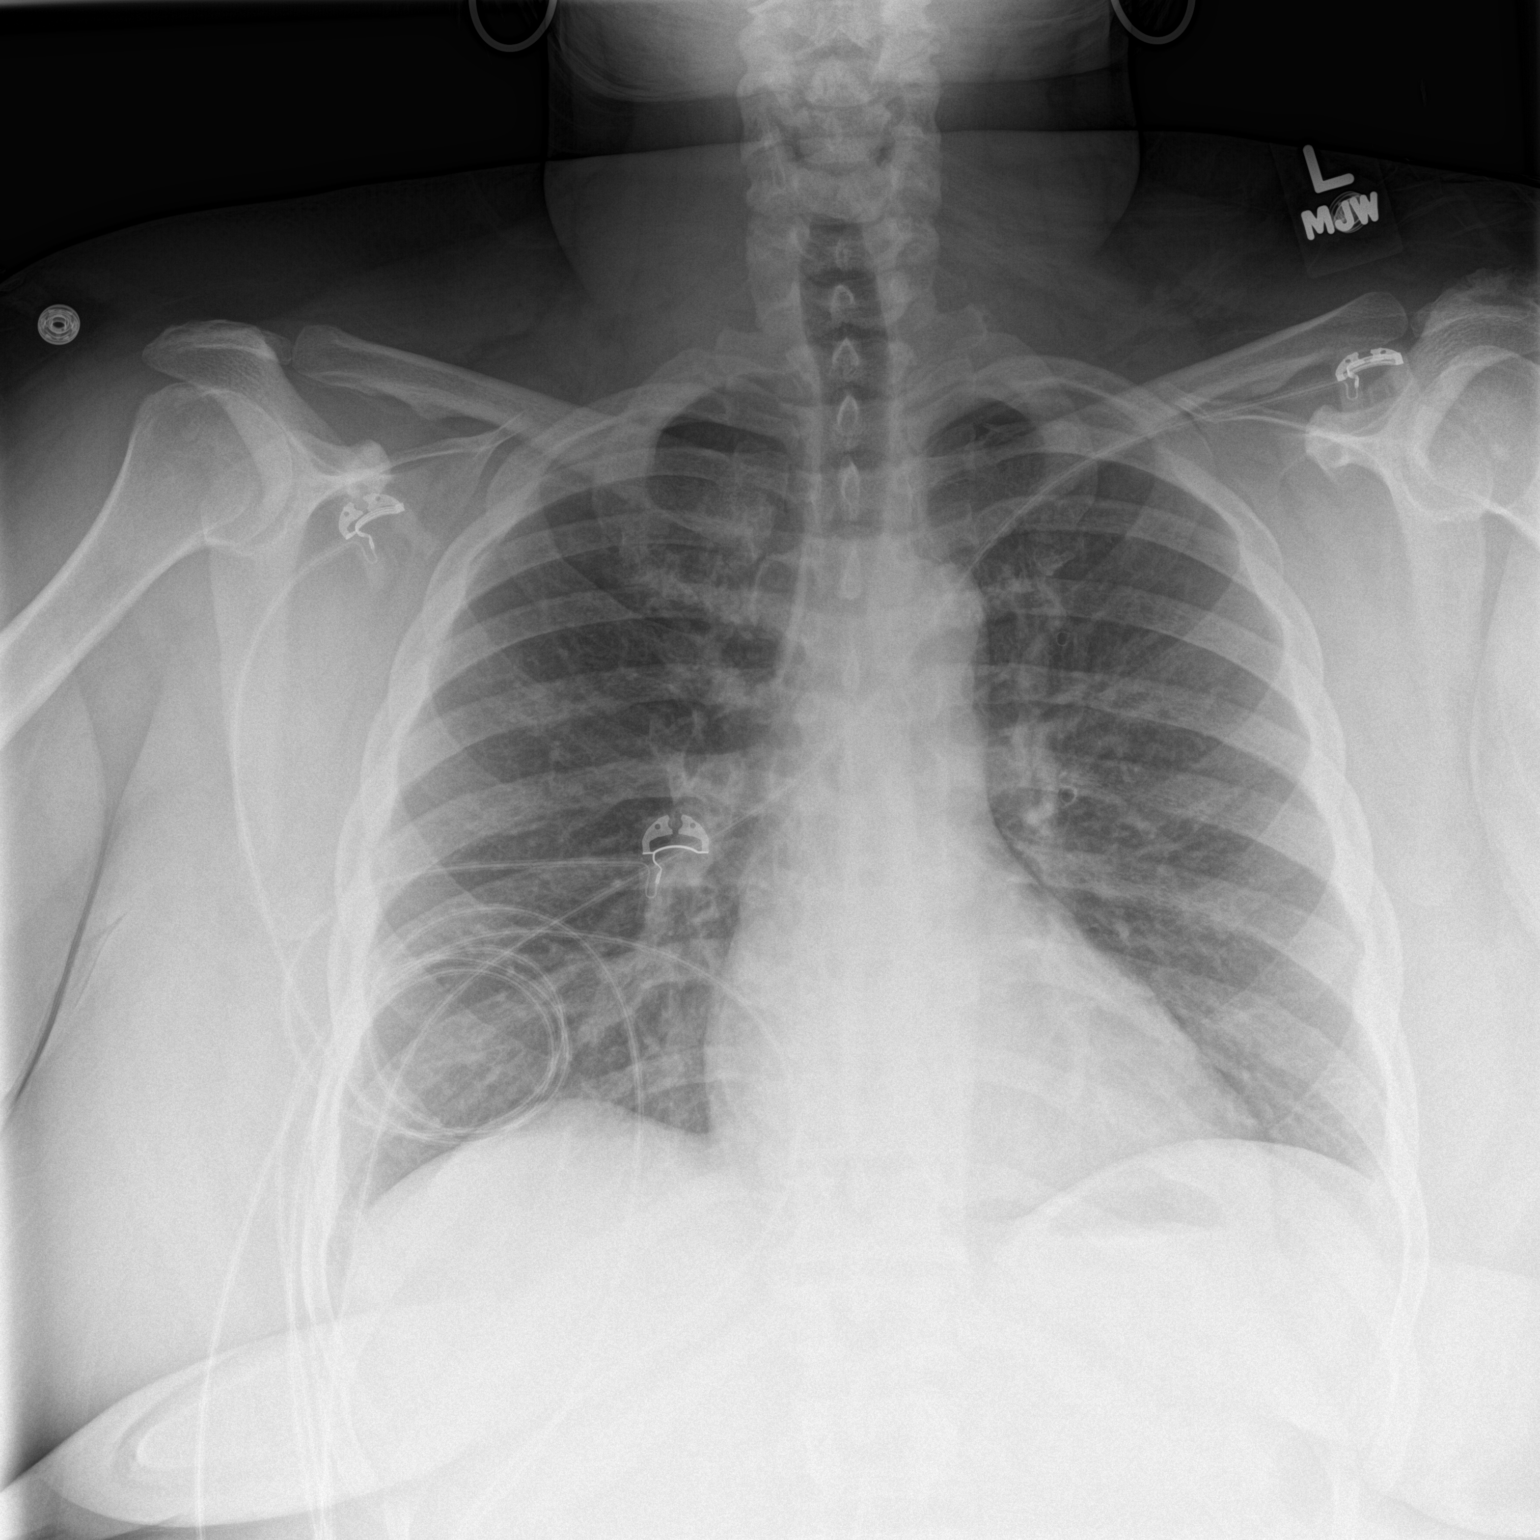

[chest lat]
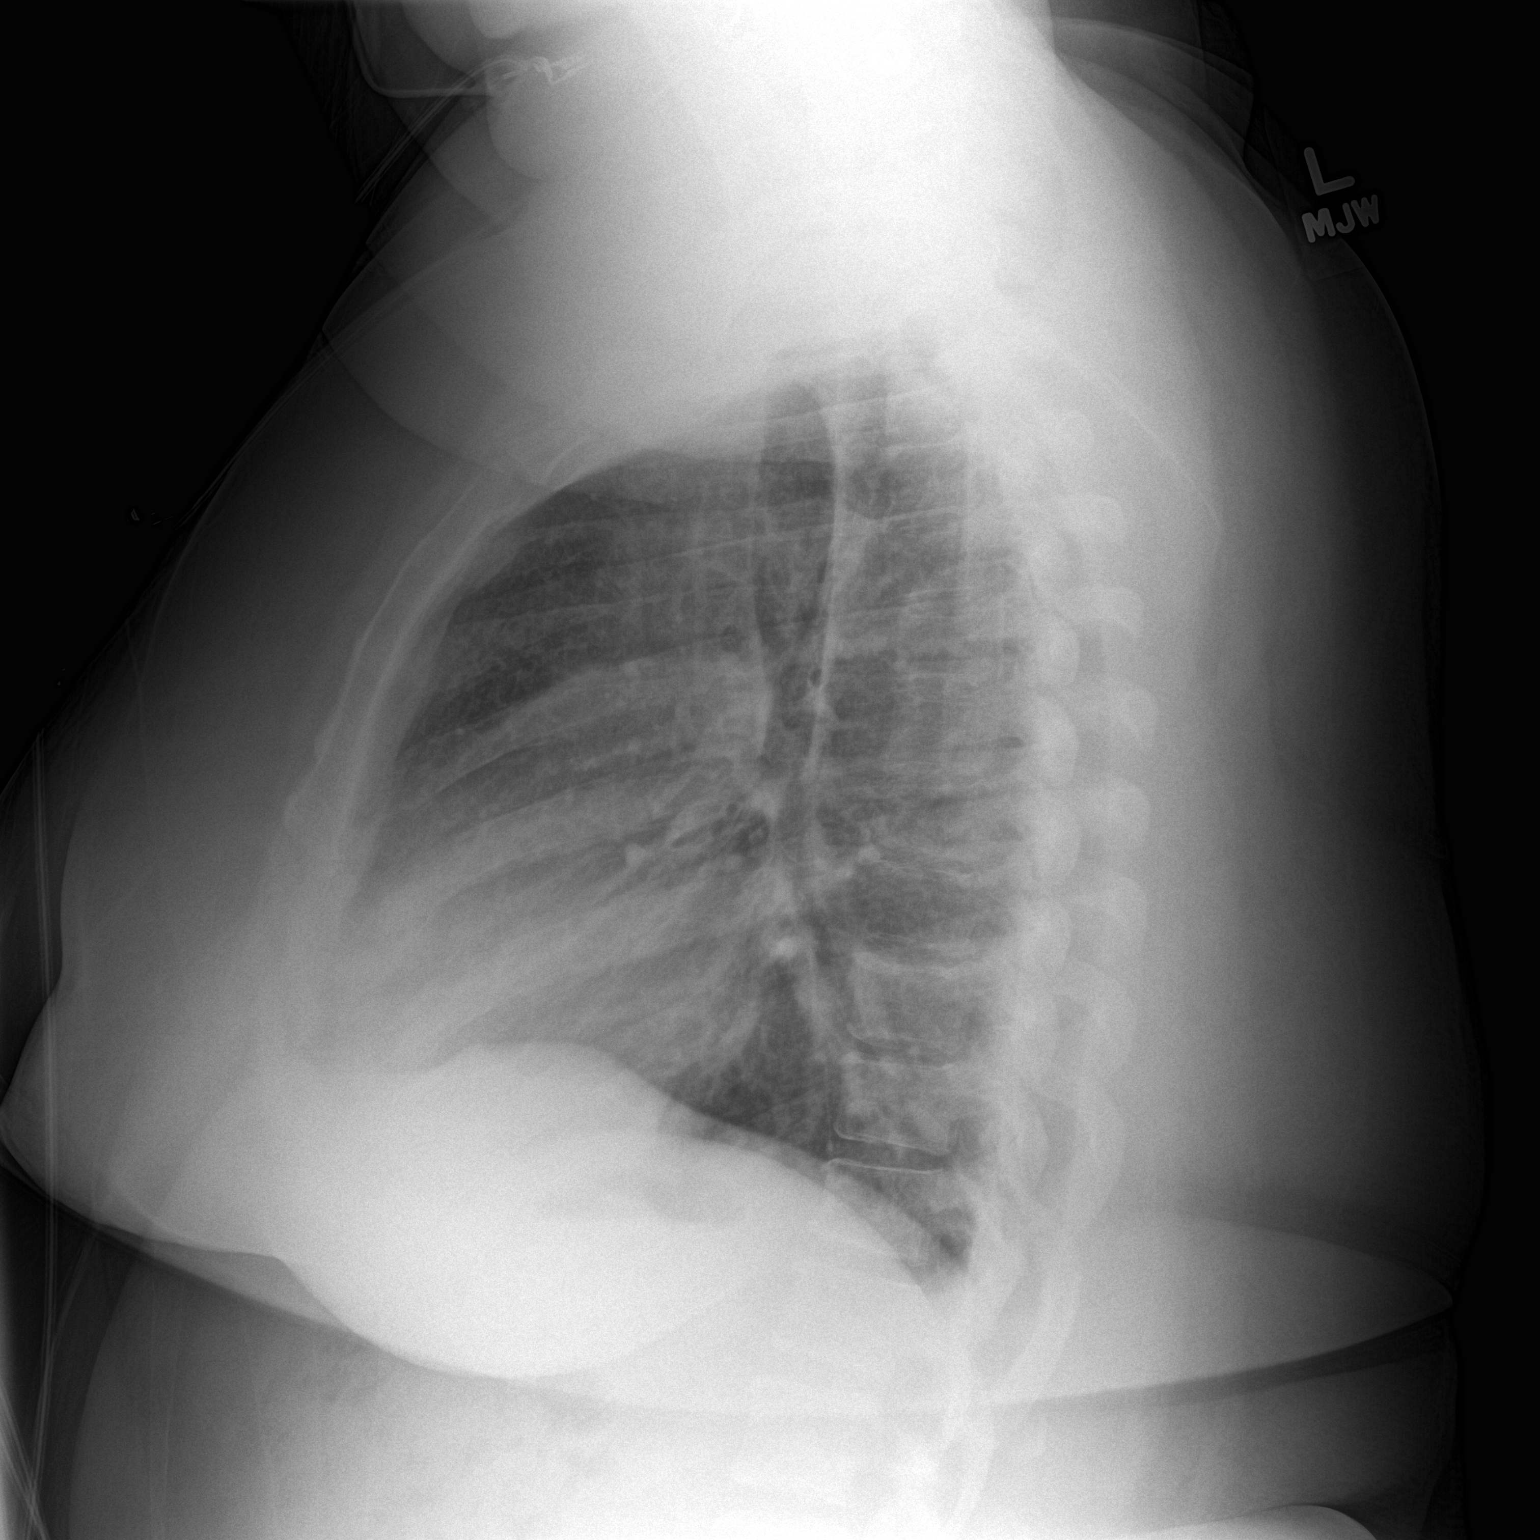

[2 of 2 positions shown; findings below may reference images not displayed]

FINDINGS: The heart size and mediastinal contours are within normal limits.
Both lungs are clear. The visualized skeletal structures are
unremarkable.
IMPRESSION: No active cardiopulmonary disease.

## 2015-10-13 ENCOUNTER — Ambulatory Visit (INDEPENDENT_AMBULATORY_CARE_PROVIDER_SITE_OTHER): Payer: Self-pay

## 2015-10-13 DIAGNOSIS — J4540 Moderate persistent asthma, uncomplicated: Secondary | ICD-10-CM

## 2015-10-13 MED ORDER — OMALIZUMAB 150 MG ~~LOC~~ SOLR
225.0000 mg | Freq: Once | SUBCUTANEOUS | Status: AC
  Administered 2015-10-13: 225 mg via SUBCUTANEOUS

## 2015-10-17 ENCOUNTER — Telehealth: Payer: Self-pay | Admitting: Internal Medicine

## 2015-10-17 NOTE — Telephone Encounter (Signed)
#   vials:4 Ordered date:10/17/15 Shipping Date: Faxed infusion 10/17/15 asked for xolair to be delivered 10/25/15. Will leave encounter open until it comes in.  [facility] did not order xolair so their going to send it tomorrow. Updated version: # vials:4 Ordered date:10/25/15 Shipping Date:10/25/15  # Vials:4 Arrival Date:10/26/15 Lot [id number] Exp Date:12/20

## 2015-10-24 ENCOUNTER — Observation Stay (HOSPITAL_COMMUNITY)
Admission: EM | Admit: 2015-10-24 | Discharge: 2015-10-26 | Disposition: A | Discharge status: Home / Self Care | Payer: Self-pay | Attending: Internal Medicine | Admitting: Internal Medicine

## 2015-10-24 ENCOUNTER — Encounter (HOSPITAL_COMMUNITY): Payer: Self-pay | Admitting: Emergency Medicine

## 2015-10-24 ENCOUNTER — Emergency Department (HOSPITAL_COMMUNITY): Payer: MEDICAID

## 2015-10-24 DIAGNOSIS — F17210 Nicotine dependence, cigarettes, uncomplicated: Secondary | ICD-10-CM | POA: Insufficient documentation

## 2015-10-24 DIAGNOSIS — J449 Chronic obstructive pulmonary disease, unspecified: Secondary | ICD-10-CM | POA: Insufficient documentation

## 2015-10-24 DIAGNOSIS — J4551 Severe persistent asthma with (acute) exacerbation: Principal | ICD-10-CM

## 2015-10-24 DIAGNOSIS — E876 Hypokalemia: Secondary | ICD-10-CM | POA: Insufficient documentation

## 2015-10-24 DIAGNOSIS — M25562 Pain in left knee: Secondary | ICD-10-CM | POA: Insufficient documentation

## 2015-10-24 DIAGNOSIS — G4733 Obstructive sleep apnea (adult) (pediatric): Secondary | ICD-10-CM | POA: Insufficient documentation

## 2015-10-24 DIAGNOSIS — J4489 Other specified chronic obstructive pulmonary disease: Secondary | ICD-10-CM

## 2015-10-24 DIAGNOSIS — M25561 Pain in right knee: Secondary | ICD-10-CM | POA: Insufficient documentation

## 2015-10-24 DIAGNOSIS — H578 Other specified disorders of eye and adnexa: Secondary | ICD-10-CM | POA: Insufficient documentation

## 2015-10-24 DIAGNOSIS — F329 Major depressive disorder, single episode, unspecified: Secondary | ICD-10-CM | POA: Insufficient documentation

## 2015-10-24 DIAGNOSIS — Z6841 Body Mass Index (BMI) 40.0 and over, adult: Secondary | ICD-10-CM | POA: Insufficient documentation

## 2015-10-24 DIAGNOSIS — L83 Acanthosis nigricans: Secondary | ICD-10-CM | POA: Insufficient documentation

## 2015-10-24 DIAGNOSIS — I10 Essential (primary) hypertension: Secondary | ICD-10-CM | POA: Insufficient documentation

## 2015-10-24 DIAGNOSIS — G4700 Insomnia, unspecified: Secondary | ICD-10-CM | POA: Insufficient documentation

## 2015-10-24 DIAGNOSIS — Z7952 Long term (current) use of systemic steroids: Secondary | ICD-10-CM | POA: Insufficient documentation

## 2015-10-24 DIAGNOSIS — M1990 Unspecified osteoarthritis, unspecified site: Secondary | ICD-10-CM | POA: Insufficient documentation

## 2015-10-24 DIAGNOSIS — J302 Other seasonal allergic rhinitis: Secondary | ICD-10-CM | POA: Insufficient documentation

## 2015-10-24 DIAGNOSIS — K219 Gastro-esophageal reflux disease without esophagitis: Secondary | ICD-10-CM | POA: Insufficient documentation

## 2015-10-24 DIAGNOSIS — Z9989 Dependence on other enabling machines and devices: Secondary | ICD-10-CM

## 2015-10-24 DIAGNOSIS — J45901 Unspecified asthma with (acute) exacerbation: Secondary | ICD-10-CM

## 2015-10-24 LAB — RAPID URINE DRUG SCREEN, HOSP PERFORMED
Amphetamines: NOT DETECTED
Barbiturates: NOT DETECTED
Benzodiazepines: NOT DETECTED
Cocaine: NOT DETECTED
OPIATES: NOT DETECTED
TETRAHYDROCANNABINOL: NOT DETECTED

## 2015-10-24 LAB — CBC
HCT: 38.2 % (ref 36.0–46.0)
HEMOGLOBIN: 12 g/dL (ref 12.0–15.0)
MCH: 28.2 pg (ref 26.0–34.0)
MCHC: 31.4 g/dL (ref 30.0–36.0)
MCV: 89.9 fL (ref 78.0–100.0)
PLATELETS: 239 10*3/uL (ref 150–400)
RBC: 4.25 MIL/uL (ref 3.87–5.11)
RDW: 16.6 % — AB (ref 11.5–15.5)
WBC: 11.9 10*3/uL — ABNORMAL HIGH (ref 4.0–10.5)

## 2015-10-24 LAB — BASIC METABOLIC PANEL
Anion gap: 6 (ref 5–15)
CALCIUM: 8.5 mg/dL — AB (ref 8.9–10.3)
CHLORIDE: 105 mmol/L (ref 101–111)
CO2: 27 mmol/L (ref 22–32)
CREATININE: 0.69 mg/dL (ref 0.44–1.00)
GFR calc Af Amer: >60 mL/min (ref >60)
GFR calc non Af Amer: >60 mL/min (ref >60)
Glucose, Bld: 117 mg/dL — ABNORMAL HIGH (ref 65–99)
Potassium: 3.4 mmol/L — ABNORMAL LOW (ref 3.5–5.1)
SODIUM: 138 mmol/L (ref 135–145)

## 2015-10-24 LAB — CBG MONITORING, ED: GLUCOSE-CAPILLARY: 283 mg/dL — AB (ref 65–99)

## 2015-10-24 LAB — GLUCOSE, CAPILLARY
GLUCOSE-CAPILLARY: 173 mg/dL — AB (ref 65–99)
Glucose-Capillary: 142 mg/dL — ABNORMAL HIGH (ref 65–99)

## 2015-10-24 MED ORDER — NICOTINE 14 MG/24HR TD PT24
14.0000 mg | MEDICATED_PATCH | Freq: Every day | TRANSDERMAL | Status: DC
  Administered 2015-10-24 – 2015-10-26 (×3): 14 mg via TRANSDERMAL
  Filled 2015-10-24 (×3): qty 1

## 2015-10-24 MED ORDER — IPRATROPIUM BROMIDE 0.02 % IN SOLN
0.5000 mg | Freq: Once | RESPIRATORY_TRACT | Status: AC
  Administered 2015-10-24: 0.5 mg via RESPIRATORY_TRACT
  Filled 2015-10-24: qty 2.5

## 2015-10-24 MED ORDER — LORATADINE 10 MG PO TABS
10.0000 mg | ORAL_TABLET | Freq: Every day | ORAL | Status: DC
  Administered 2015-10-24 – 2015-10-26 (×3): 10 mg via ORAL
  Filled 2015-10-24 (×3): qty 1

## 2015-10-24 MED ORDER — ALBUTEROL SULFATE (2.5 MG/3ML) 0.083% IN NEBU
2.5000 mg | INHALATION_SOLUTION | RESPIRATORY_TRACT | Status: DC | PRN
  Administered 2015-10-25 (×2): 2.5 mg via RESPIRATORY_TRACT
  Filled 2015-10-24 (×2): qty 3

## 2015-10-24 MED ORDER — METHYLPREDNISOLONE SODIUM SUCC 125 MG IJ SOLR
125.0000 mg | Freq: Once | INTRAMUSCULAR | Status: AC
  Administered 2015-10-24: 125 mg via INTRAVENOUS
  Filled 2015-10-24: qty 2

## 2015-10-24 MED ORDER — MAGNESIUM SULFATE 2 GM/50ML IV SOLN
2.0000 g | Freq: Once | INTRAVENOUS | Status: AC
  Administered 2015-10-24: 2 g via INTRAVENOUS
  Filled 2015-10-24: qty 50

## 2015-10-24 MED ORDER — ALBUTEROL SULFATE (2.5 MG/3ML) 0.083% IN NEBU
INHALATION_SOLUTION | RESPIRATORY_TRACT | Status: AC
  Filled 2015-10-24: qty 6

## 2015-10-24 MED ORDER — ACETAMINOPHEN 325 MG PO TABS
650.0000 mg | ORAL_TABLET | Freq: Four times a day (QID) | ORAL | Status: DC | PRN
  Administered 2015-10-24 – 2015-10-26 (×6): 650 mg via ORAL
  Filled 2015-10-24 (×6): qty 2

## 2015-10-24 MED ORDER — IPRATROPIUM-ALBUTEROL 0.5-2.5 (3) MG/3ML IN SOLN
3.0000 mL | RESPIRATORY_TRACT | Status: DC
  Administered 2015-10-24 – 2015-10-25 (×7): 3 mL via RESPIRATORY_TRACT
  Filled 2015-10-24 (×7): qty 3

## 2015-10-24 MED ORDER — HYDROCHLOROTHIAZIDE 25 MG PO TABS
25.0000 mg | ORAL_TABLET | Freq: Every day | ORAL | Status: DC
  Administered 2015-10-24 – 2015-10-26 (×3): 25 mg via ORAL
  Filled 2015-10-24 (×3): qty 1

## 2015-10-24 MED ORDER — ACETAMINOPHEN 650 MG RE SUPP: 650.0000 mg | Freq: Four times a day (QID) | RECTAL | Status: DC | PRN

## 2015-10-24 MED ORDER — SERTRALINE HCL 50 MG PO TABS
50.0000 mg | ORAL_TABLET | Freq: Every day | ORAL | Status: DC
  Administered 2015-10-24 – 2015-10-26 (×3): 50 mg via ORAL
  Filled 2015-10-24 (×4): qty 1

## 2015-10-24 MED ORDER — ALBUTEROL SULFATE (2.5 MG/3ML) 0.083% IN NEBU
10.0000 mg | INHALATION_SOLUTION | Freq: Once | RESPIRATORY_TRACT | Status: AC
Start: 2015-10-24 — End: 2015-10-24
  Administered 2015-10-24: 10 mg via RESPIRATORY_TRACT
  Filled 2015-10-24: qty 12

## 2015-10-24 MED ORDER — ALBUTEROL SULFATE (2.5 MG/3ML) 0.083% IN NEBU
5.0000 mg | INHALATION_SOLUTION | Freq: Once | RESPIRATORY_TRACT | Status: AC
  Administered 2015-10-24: 5 mg via RESPIRATORY_TRACT
  Filled 2015-10-24: qty 6

## 2015-10-24 MED ORDER — PANTOPRAZOLE SODIUM 40 MG PO TBEC
40.0000 mg | DELAYED_RELEASE_TABLET | Freq: Every day | ORAL | Status: DC
  Administered 2015-10-24 – 2015-10-26 (×3): 40 mg via ORAL
  Filled 2015-10-24 (×3): qty 1

## 2015-10-24 MED ORDER — THEOPHYLLINE ER 300 MG PO TB12
300.0000 mg | ORAL_TABLET | Freq: Every day | ORAL | Status: DC
  Administered 2015-10-24 – 2015-10-26 (×3): 300 mg via ORAL
  Filled 2015-10-24 (×3): qty 1

## 2015-10-24 MED ORDER — PREDNISONE 20 MG PO TABS
40.0000 mg | ORAL_TABLET | Freq: Every day | ORAL | Status: DC
  Administered 2015-10-25 – 2015-10-26 (×2): 40 mg via ORAL
  Filled 2015-10-24 (×3): qty 2

## 2015-10-24 MED ORDER — POTASSIUM CHLORIDE CRYS ER 20 MEQ PO TBCR
40.0000 meq | EXTENDED_RELEASE_TABLET | Freq: Once | ORAL | Status: AC
  Administered 2015-10-24: 40 meq via ORAL
  Filled 2015-10-24: qty 2

## 2015-10-24 MED ORDER — MELOXICAM 7.5 MG PO TABS
7.5000 mg | ORAL_TABLET | Freq: Every day | ORAL | Status: DC
  Administered 2015-10-24 – 2015-10-26 (×3): 7.5 mg via ORAL
  Filled 2015-10-24 (×3): qty 1

## 2015-10-24 MED ORDER — ALBUTEROL SULFATE (2.5 MG/3ML) 0.083% IN NEBU
5.0000 mg | INHALATION_SOLUTION | Freq: Once | RESPIRATORY_TRACT | Status: AC
  Administered 2015-10-24: 5 mg via RESPIRATORY_TRACT

## 2015-10-24 MED ORDER — IBUPROFEN 400 MG PO TABS
400.0000 mg | ORAL_TABLET | ORAL | Status: DC | PRN
  Administered 2015-10-24 – 2015-10-25 (×4): 400 mg via ORAL
  Filled 2015-10-24 (×4): qty 1

## 2015-10-24 MED ORDER — DICLOFENAC SODIUM 1 % TD GEL
4.0000 g | Freq: Four times a day (QID) | TRANSDERMAL | Status: DC
  Administered 2015-10-24 – 2015-10-25 (×6): 4 g via TOPICAL
  Filled 2015-10-24: qty 100

## 2015-10-24 MED ORDER — RAMELTEON 8 MG PO TABS
8.0000 mg | ORAL_TABLET | Freq: Every day | ORAL | Status: DC
  Administered 2015-10-25 (×2): 8 mg via ORAL
  Filled 2015-10-24 (×2): qty 1

## 2015-10-24 MED ORDER — BUDESONIDE 0.5 MG/2ML IN SUSP
0.2500 mg | Freq: Two times a day (BID) | RESPIRATORY_TRACT | Status: DC
  Administered 2015-10-24 – 2015-10-26 (×5): 0.25 mg via RESPIRATORY_TRACT
  Filled 2015-10-24 (×5): qty 2

## 2015-10-24 MED ORDER — ENOXAPARIN SODIUM 40 MG/0.4ML ~~LOC~~ SOLN
40.0000 mg | SUBCUTANEOUS | Status: DC
  Administered 2015-10-24 – 2015-10-25 (×2): 40 mg via SUBCUTANEOUS
  Filled 2015-10-24 (×2): qty 0.4

## 2015-10-24 NOTE — H&P (Signed)
Date: 10/24/2015               Patient Name:  April [name] MRN: [id number]  DOB: May 05, 1970 Age / Sex: 45 y.o., female   PCP: Deneise Lever, MD           Medical Service: Internal Medicine Teaching Service         Attending Physician: Dr. Jola Schmidt, MD    First Contact: Dr. Tiburcio Pea, MD Pager: [phone]  Second Contact: Dr. Posey Pronto, MD Pager: [phone]       After Hours (After 5p/  First Contact Pager: [phone]  weekends / holidays): Second Contact Pager: [phone]    Most Recent Discharge Date:  09/14/15  Chief Complaint:  Chief Complaint  Patient presents with  . Asthma       History of Present Illness:  April [name] is a 45 y.o. female who has a past medical history of PMH of severe persistent asthma on chronic steroids, OSA, allergies, GERD, depression, and morbid obesity who presents with increased wheezing and shortness of breath.  She is frequently admitted for exacerbations, with the most recent hospitalization occuring in April.  No history of prior intubation.  Three days ago she felt like she was getting sick with a cold with symptoms of dyspnea, wheezing, and nonproductive cough.  No URI symptoms but reports some generalized body aches and HA.  No fever/chills, chest pain, N/V, abdominal pain.  She reports compliance with meds although upon questioning she ran out of theophylline 1-2 weeks ago.  She started using prednisone on Saturday and took 1-2 tablets.  She has been using her nebulizer more frequently since she began feeling worse, reports using 2x yesterday.  Has not been checking peak flows.  No sick contacts or recent long distance travel.[name]  She works as a Engineer, petroleum at [employer].  She still smokes on occasion, with the last cigarette being ~2 weeks ago.  Denies alcohol or recreational drug use.  Of notes, she also is on xolair for allergies and gets injections every 2 weeks and is scheduled for one on Friday.    In the ED, [facility].  She was given solumedrol 125mg ,  duonebs, and magnesium.  CXR with mild hyperinflation.  Mild leukocytosis of 11.9.     Meds: Current Facility-Administered Medications  Medication Dose Route Frequency Provider Last Rate Last Dose  . potassium chloride SA (K-DUR,KLOR-CON) CR tablet 40 mEq  40 mEq Oral Once [name], MD       Current Outpatient Prescriptions  Medication Sig Dispense Refill  . acetaminophen (TYLENOL) 500 MG tablet Take 2 tablets (1,000 mg total) by mouth every 6 (six) hours as needed. (Patient taking differently: Take 1,000 mg by mouth every 6 (six) hours as needed for mild pain. ) 90 tablet 3  . albuterol (PROVENTIL HFA;VENTOLIN HFA) 108 (90 Base) MCG/ACT inhaler Inhale 1-2 puffs into the lungs every 4 (four) hours as needed for wheezing or shortness of breath. 6 Inhaler 5  . albuterol (PROVENTIL) (2.5 MG/3ML) 0.083% nebulizer solution Take 3 mLs (2.5 mg total) by nebulization every 6 (six) hours as needed for wheezing or shortness of breath. 150 mL 1  . esomeprazole (NEXIUM) 40 MG capsule Take 1 capsule (40 mg total) by mouth daily. 90 capsule 3  . hydrochlorothiazide (HYDRODIURIL) 25 MG tablet Take 1 tablet (25 mg total) by mouth daily. 90 tablet 3  . HYDROcodone-acetaminophen (NORCO) 5-325 MG tablet Take 1 tablet by mouth every 6 (six)  hours as needed for severe pain. 15 tablet 0  . Ibuprofen-Diphenhydramine HCl (ADVIL PM) 200-25 MG CAPS Take 2-4 tablets by mouth at bedtime as needed (pain).    [name] ipratropium (ATROVENT) 0.02 % nebulizer solution Take 2.5 mLs (0.5 mg total) by nebulization 4 (four) times daily as needed for wheezing or shortness of breath. 75 mL 2  . loratadine (CLARITIN) 10 MG tablet Take 1 tablet (10 mg total) by mouth daily. 90 tablet 3  . meloxicam (MOBIC) 7.5 MG tablet Take 1 tablet (7.5 mg total) by mouth daily. 30 tablet 0  . mometasone-formoterol (DULERA) 200-5 MCG/ACT AERO Inhale 2 puffs into the lungs 2 (two) times daily. 3 Inhaler 5  . omalizumab (XOLAIR) 150 MG injection  Inject 150 mg into the skin every 14 (fourteen) days.    [name] sertraline (ZOLOFT) 50 MG tablet Take 1 tablet (50 mg total) by mouth daily. 90 tablet 3  . theophylline (THEODUR) 300 MG 12 hr tablet Take 1 tablet (300 mg total) by mouth daily. 90 tablet 1  . Liraglutide -Weight Management 18 MG/3ML SOPN Inject 0.6 mg into the skin daily. For one week, then 1.2mg  daily for one week, then 1.8mg  daily for 1 week, then 2.4mg  daily for 1 week, then 3mg  daily thereafter (Patient not taking: Reported on 10/24/2015) 6 pen 1     (Not in a hospital admission)  Allergies: Allergies as of 10/24/2015  . (No Known Allergies)    PMH: Past Medical History  Diagnosis Date  . Acanthosis nigricans   . Menorrhagia   . Hypertension, essential   . Insomnia   . Morbid obesity ([place])   . Helicobacter pylori (H. pylori) infection   . Sleep apnea     Sleep study 2008 - mild OSA, not enough events to titrate CPAP  . COPD (chronic obstructive pulmonary disease) ([place])     PFTs in 2002, FEV1/FVC 65, no post bronchodilater test done  . Asthma     Followed by Dr. Annamaria Boots (pulmonology); receives every other week omalizumab injections; has frequent exacerbations  . Depression   . GERD (gastroesophageal reflux disease)   . Tobacco user   . Obesity   . Shortness of breath   . Headache(784.0)   . Seasonal allergies   . Arthritis     PSH: Past Surgical History  Procedure Laterality Date  . Tubal ligation  1996    bilateral  . Breast reduction surgery  09/2011    FH: Family History  Problem Relation Age of Onset  . Asthma Daughter   . Cancer Paternal Aunt   . Asthma Maternal Grandmother   . Hypertension Mother     SH: Social History  Substance Use Topics  . Smoking status: Former Smoker -- 18 years    Types: Cigarettes    Quit date: 09/15/2014  . Smokeless tobacco: Former Systems developer    Quit date: 09/12/2014  . Alcohol Use: No    Review of Systems: Pertinent items are noted in HPI.  All other systems  reviewed and are negative.  Physical Exam: BP 152/75 mmHg  Pulse 101  Temp(Src) 99.2 F (37.3 C) (Oral)  Resp 22  Ht 5\' 5"  (1.651 m)  Wt 329 lb (149.233 kg)  BMI 54.75 kg/m2  SpO2 98%  LMP 09/05/2015  Physical Exam  Constitutional: Vital signs reviewed.  Patient is a well-developed and well-nourished female in no acute distress and cooperative with exam.  Head: Normocephalic and atraumatic Eyes: EOMI, conjunctivae normal, no scleral icterus.  Neck: Supple, Trachea midline. Cardiovascular: RRR, no MRG. Pulmonary/Chest: Normal respiratory effort, diffuse expiratory wheezes without rales or rhonchi.  Upper airway wheezes.   Abdominal: Soft. Non-tender, non-distended, bowel sounds are normal. Neurological: A&O x3, cranial nerve II-XII are grossly intact, moving all extremities.   Skin: Warm, dry and intact.  Psychiatric: Normal mood and affect.    Lab results:  Basic Metabolic Panel:  Recent Labs  10/24/15 0557  NA 138  K 3.4*  CL 105  CO2 27  GLUCOSE 117*  BUN <5*  CREATININE 0.69  CALCIUM 8.5*    Calcium/Magnesium/Phosphorus:  Recent Labs Lab 10/24/15 0557  CALCIUM 8.5*    Liver Function Tests: No results for input(s): AST, ALT, ALKPHOS, BILITOT, PROT, ALBUMIN in the last 72 hours. No results for input(s): LIPASE, AMYLASE in the last 72 hours. No results for input(s): AMMONIA in the last 72 hours.  CBC: Lab Results  Component Value Date   WBC 11.9* 10/24/2015   HGB 12.0 10/24/2015   HCT 38.2 10/24/2015   MCV 89.9 10/24/2015   PLT 239 10/24/2015    Lipase: Lab Results  Component Value Date   LIPASE 17 12/22/2012    Lactic Acid/Procalcitonin: No results for input(s): LATICACIDVEN, PROCALCITON, O2SATVEN in the last 168 hours.  Cardiac Enzymes: No results for input(s): TROPIPOC in the last 72 hours. Lab Results  Component Value Date   CKTOTAL 234* 02/28/2010   CKMB 2.0 02/28/2010   TROPONINI <0.03 03/13/2015    BNP: No results for  input(s): PROBNP in the last 72 hours.  D-Dimer: No results for input(s): DDIMER in the last 72 hours.  CBG: No results for input(s): GLUCAP in the last 72 hours.  Hemoglobin A1C: No results for input(s): HGBA1C in the last 72 hours.  Lipid Panel: No results for input(s): CHOL, HDL, LDLCALC, TRIG, CHOLHDL, LDLDIRECT in the last 72 hours.  Thyroid Function Tests: No results for input(s): TSH, T4TOTAL, FREET4, T3FREE, THYROIDAB in the last 72 hours.  Anemia Panel: No results for input(s): VITAMINB12, FOLATE, FERRITIN, TIBC, IRON, RETICCTPCT in the last 72 hours.  Coagulation: No results for input(s): LABPROT, INR in the last 72 hours.  Urine Drug Screen: Drugs of Abuse:     Component Value Date/Time   LABOPIA NONE DETECTED 09/15/2014 1515   COCAINSCRNUR NONE DETECTED 09/15/2014 1515   LABBENZ NONE DETECTED 09/15/2014 1515   AMPHETMU NONE DETECTED 09/15/2014 1515   THCU NONE DETECTED 09/15/2014 1515   LABBARB NONE DETECTED 09/15/2014 1515    Alcohol Level: No results for input(s): ETH in the last 72 hours.  Urinalysis:    Component Value Date/Time   COLORURINE YELLOW 11/21/2014 0707   APPEARANCEUR CLOUDY* 11/21/2014 0707   LABSPEC 1.024 11/21/2014 0707   PHURINE 6.0 11/21/2014 0707   GLUCOSEU NEGATIVE 11/21/2014 0707   GLUCOSEU NEG mg/dL 10/28/2007 2049   HGBUR TRACE* 11/21/2014 0707   BILIRUBINUR NEGATIVE 11/21/2014 0707   KETONESUR NEGATIVE 11/21/2014 0707   PROTEINUR NEGATIVE 11/21/2014 0707   UROBILINOGEN 1.0 11/21/2014 0707   NITRITE NEGATIVE 11/21/2014 0707   LEUKOCYTESUR NEGATIVE 11/21/2014 0707    Imaging results:  Dg Chest 2 View  10/24/2015  CLINICAL DATA:  Dyspnea and wheezing. EXAM: CHEST  2 VIEW COMPARISON:  08/22/2015 FINDINGS: There is mild hyperinflation. The lungs are clear. The pulmonary vasculature is normal. There is no pleural effusion. IMPRESSION: Mild hyperinflation. Electronically Signed   By: Andreas Newport M.D.   On: 10/24/2015 06:33      EKG: EKG Interpretation  Date/Time:    Ventricular Rate:    PR Interval:    QRS Duration:   QT Interval:    QTC Calculation:   R Axis:     Text Interpretation:     Antibiotics: Antibiotics Given (last 72 hours)    None      Anti-infectives    None    Consults:  None   Assessment & Plan by Problem: Principal Problem:   Acute asthma exacerbation Active Problems:   Essential hypertension   Obstructive sleep apnea   Knee pain, bilateral   Seasonal allergic rhinitis   GERD (gastroesophageal reflux disease)  Acute asthma exacerbation  Pt reports increased SOB, wheezing, and non-produtive cough.  CXR.  CXR with mild hyperinflation.  Mild leukocytosis of 11.9 likely steroid induced.  Wells score: 1.5, so risk of PE is low probability.  Pt last exacerbation requiring hospitalization was in April.  She has never been intubated for asthma.  OHS and OSA contributing.  -resume theophylline  -duonebs q4h, albuterol q2h PRN -budesonide bid -prednisone 40mg   -O2 [place] to keep O2sat>92%   -cont pulse ox -check peak flows  -cbg checks  -ABG if decompensates  Hypertension  -cont home meds   GERD  Pt on chronic PPI. -cont home meds  Knee pain Likely OA, recently received injection in left knee.  -tylenol PRN -voltaren gel qid -weight loss needed   Substance abuse  She is smoking on occasion, last cigarette was about 1 wk ago.   -encourage smoking cessation   FEN  Fluids-None Electrolytes-Mild hypokalemia, replete kdur 40mg Eq. Nutrition- Carb modified/Heart healthy  VTE prophylaxis  lovenox 40mg  SQ qd  Disposition Disposition deferred at this time, awaiting improvement of current medical problems. Anticipated discharge in approximately 1-2 day(s).    Emergency Contact Contact Information    Name Relation Home Work Leonore Mother [phone]        The patient does have a current PCP (Tasrif Ahmed, MD) and does need an [facility] hospital  follow-up appointment after discharge.  Signed [name], MD Internal Medicine Teaching Service, PGY-3 10/24/2015, 9:30 AM

## 2015-10-24 NOTE — ED Notes (Signed)
Upon transporting Pt to room, belongings of a purse and shirt were placed in a bag, labeled and went with PT.

## 2015-10-24 NOTE — ED Notes (Signed)
CBG 283 

## 2015-10-24 NOTE — ED Provider Notes (Addendum)
CSN: [id number]     Arrival date & time 10/24/15  0309 History   First MD Initiated Contact with Patient 10/24/15 0536     Chief Complaint  Patient presents with  . Asthma      HPI Patient has a history of asthma reports over the past several days she's had increasing shortness of breath despite her albuterol treatments at home.  She was hospitalized in April for similar symptoms.  She reports this feels similar to that.  She reports chills without documented fever.  No productive cough.  Denies abdominal pain.  No nausea vomiting or diarrhea.  No other complaints at this time.  Her breathing is moderate to severe in severity.   Primary pulmonologist: Dr. Annamaria Boots   Past Medical History  Diagnosis Date  . Acanthosis nigricans   . Menorrhagia   . Hypertension, essential   . Insomnia   . Morbid obesity ([place])   . Helicobacter pylori (H. pylori) infection   . Sleep apnea     Sleep study 2008 - mild OSA, not enough events to titrate CPAP  . COPD (chronic obstructive pulmonary disease) ([place])     PFTs in 2002, FEV1/FVC 65, no post bronchodilater test done  . Asthma     Followed by Dr. Annamaria Boots (pulmonology); receives every other week omalizumab injections; has frequent exacerbations  . Depression   . GERD (gastroesophageal reflux disease)   . Tobacco user   . Obesity   . Shortness of breath   . Headache(784.0)   . Seasonal allergies   . Arthritis    Past Surgical History  Procedure Laterality Date  . Tubal ligation  1996    bilateral  . Breast reduction surgery  09/2011   Family History  Problem Relation Age of Onset  . Asthma Daughter   . Cancer Paternal Aunt   . Asthma Maternal Grandmother   . Hypertension Mother    Social History  Substance Use Topics  . Smoking status: Former Smoker -- 18 years    Types: Cigarettes    Quit date: 09/15/2014  . Smokeless tobacco: Former Systems developer    Quit date: 09/12/2014  . Alcohol Use: No   OB History    No data available     Review  of Systems  All other systems reviewed and are negative.     Allergies  Review of patient's allergies indicates no known allergies.  Home Medications   Prior to Admission medications   Medication Sig Start Date End Date Taking? Authorizing Provider  acetaminophen (TYLENOL) 500 MG tablet Take 2 tablets (1,000 mg total) by mouth every 6 (six) hours as needed. 08/30/15   [name], MD  albuterol (PROVENTIL HFA;VENTOLIN HFA) 108 (90 Base) MCG/ACT inhaler Inhale 1-2 puffs into the lungs every 4 (four) hours as needed for wheezing or shortness of breath. 08/30/15   [name], MD  albuterol (PROVENTIL) (2.5 MG/3ML) 0.083% nebulizer solution Take 3 mLs (2.5 mg total) by nebulization every 6 (six) hours as needed for wheezing or shortness of breath. 08/30/15   [name], MD  esomeprazole (NEXIUM) 40 MG capsule Take 1 capsule (40 mg total) by mouth daily. 08/30/15 08/29/16  [name], MD  hydrochlorothiazide (HYDRODIURIL) 25 MG tablet Take 1 tablet (25 mg total) by mouth daily. 08/30/15   [name], MD  HYDROcodone-acetaminophen (NORCO) 5-325 MG tablet Take 1 tablet by mouth every 6 (six) hours as needed for severe pain. 09/14/15   Melony Overly, MD  ipratropium (ATROVENT) 0.02 %  nebulizer solution Take 2.5 mLs (0.5 mg total) by nebulization 4 (four) times daily as needed for wheezing or shortness of breath. 08/30/15   [name], MD  Liraglutide -Weight Management 18 MG/3ML SOPN Inject 0.6 mg into the skin daily. For one week, then 1.2mg  daily for one week, then 1.8mg  daily for 1 week, then 2.4mg  daily for 1 week, then 3mg  daily thereafter 08/30/15   [name], MD  loratadine (CLARITIN) 10 MG tablet Take 1 tablet (10 mg total) by mouth daily. 08/30/15   [name], MD  meloxicam (MOBIC) 7.5 MG tablet Take 1 tablet (7.5 mg total) by mouth daily. 09/14/15   Melony Overly, MD  mometasone-formoterol (DULERA) 200-5 MCG/ACT AERO Inhale 2 puffs into the lungs 2 (two) times daily. 08/30/15   [name], MD  omalizumab  Arvid Right) 150 MG injection Inject 150 mg into the skin every 14 (fourteen) days.    Historical Provider, MD  predniSONE (DELTASONE) 20 MG tablet Take 2 pills daily for 1 week, alternate 2 and 1.5 pills daily for 1 week, take 1.5 pills daily for 1 week, alternate 1.5 and 1 pills daily for 1 week, then take 1 pill daily 08/30/15   [name], MD  sertraline (ZOLOFT) 50 MG tablet Take 1 tablet (50 mg total) by mouth daily. 08/30/15   [name], MD  theophylline (THEODUR) 300 MG 12 hr tablet Take 1 tablet (300 mg total) by mouth daily. 08/30/15   [name], MD   BP 175/97 mmHg  Pulse 95  Temp(Src) 99.2 F (37.3 C) (Oral)  Resp 22  Ht 5\' 5"  (1.651 m)  Wt 329 lb (149.233 kg)  BMI 54.75 kg/m2  SpO2 99% Physical Exam  Constitutional: She is oriented to person, place, and time. She appears well-developed and well-nourished. No distress.  HENT:  Head: Normocephalic and atraumatic.  Eyes: EOM are normal.  Neck: Normal range of motion.  Cardiovascular: Normal rate, regular rhythm and normal heart sounds.   Pulmonary/Chest: She is in respiratory distress. She has wheezes.  Abdominal: Soft. She exhibits no distension. There is no tenderness.  Musculoskeletal: Normal range of motion.  Neurological: She is alert and oriented to person, place, and time.  Skin: Skin is warm and dry.  Psychiatric: She has a normal mood and affect. Judgment normal.  Nursing note and vitals reviewed.   ED Course  Procedures (including critical care time)    +++++++++++++++++++++++++++++++++++++++++++++++++++++++++  CRITICAL CARE Performed by: Hoy Morn Total critical care time: 33 minutes Critical care time was exclusive of separately billable procedures and treating other patients. Critical care was necessary to treat or prevent imminent or life-threatening deterioration. Critical care was time spent personally by me on the following activities: development of treatment plan with patient and/or surrogate as  well as nursing, discussions with consultants, evaluation of patient's response to treatment, examination of patient, obtaining history from patient or surrogate, ordering and performing treatments and interventions, ordering and review of laboratory studies, ordering and review of radiographic studies, pulse oximetry and re-evaluation of patient's condition.  +++++++++++++++++++++++++++++++++++++++++++++++++++++++++++    Labs Review Labs Reviewed  CBC - Abnormal; Notable for the following:    WBC 11.9 (*)    RDW 16.6 (*)    All other components within normal limits  BASIC METABOLIC PANEL - Abnormal; Notable for the following:    Potassium 3.4 (*)    Glucose, Bld 117 (*)    BUN <5 (*)    Calcium 8.5 (*)    All other  components within normal limits    Imaging Review Dg Chest 2 View  10/24/2015  CLINICAL DATA:  Dyspnea and wheezing. EXAM: CHEST  2 VIEW COMPARISON:  08/22/2015 FINDINGS: There is mild hyperinflation. The lungs are clear. The pulmonary vasculature is normal. There is no pleural effusion. IMPRESSION: Mild hyperinflation. Electronically Signed   By: Andreas Newport M.D.   On: 10/24/2015 06:33   I have personally reviewed and evaluated these images and lab results as part of my medical decision-making.   EKG Interpretation None      MDM   Final diagnoses:  None    7:10 AM Patient and continues to feel short of breath and continues to have wheezing.  This likely asthma exacerbation.  She is failed outpatient treatment in terms of the amount of albuterol she is using.  She's not been on steroids this point.  Regardless I will work on admission the hospital for ongoing treatment.  Additional continuous neb started at this time.  Will add magnesium as well as this helped last time she was admitted the hospital.    Jola Schmidt, MD 10/24/15 [place], MD 10/24/15 [phone]

## 2015-10-24 NOTE — ED Notes (Signed)
Pt. Reports asthma attack with wheezing and occassional productive cough this evening unrelieved by MDI . Denies fever or chills.

## 2015-10-24 NOTE — Care Management Note (Signed)
Case Management Note  Patient Details  Name: April [name] MRN: [id number] Date of Birth: 1970-06-01  Subjective/Objective:                  45 y.o. female who has a past medical history of PMH of severe persistent asthma on chronic steroids, OSA, allergies, GERD, depression, and morbid obesity who presents with increased wheezing and shortness of breath./From home alone support system: April [name] Mother [phone]     Action/Plan: Follow for disposition needs.   Expected Discharge Date:  10/26/15               Expected Discharge Plan:  [place]  In-House Referral:     Discharge planning Services  CM Consult  Post Acute Care Choice:    Choice offered to:     DME Arranged:    DME Agency:     HH Arranged:    [place] Agency:     Status of Service:  In process, will continue to follow  If discussed at Long Length of Stay Meetings, dates discussed:    Additional Comments:  April Mandril, RN 10/24/2015, 9:47 AM

## 2015-10-24 NOTE — Progress Notes (Signed)
Peak flow measured.  Pt gave good effort.  Pre: 300.  Post: 400.

## 2015-10-24 NOTE — ED Notes (Signed)
Respiratory called to come and reevaluate.   Patient wanting another breathing treatment.   Patient states she does feel better, but feels she would benefit from another treatment.

## 2015-10-25 DIAGNOSIS — H578 Other specified disorders of eye and adnexa: Secondary | ICD-10-CM

## 2015-10-25 DIAGNOSIS — Z7952 Long term (current) use of systemic steroids: Secondary | ICD-10-CM

## 2015-10-25 DIAGNOSIS — I10 Essential (primary) hypertension: Secondary | ICD-10-CM

## 2015-10-25 DIAGNOSIS — J4551 Severe persistent asthma with (acute) exacerbation: Secondary | ICD-10-CM

## 2015-10-25 DIAGNOSIS — F17200 Nicotine dependence, unspecified, uncomplicated: Secondary | ICD-10-CM

## 2015-10-25 LAB — GLUCOSE, CAPILLARY
Glucose-Capillary: 111 mg/dL — ABNORMAL HIGH (ref 65–99)
Glucose-Capillary: 140 mg/dL — ABNORMAL HIGH (ref 65–99)
Glucose-Capillary: 189 mg/dL — ABNORMAL HIGH (ref 65–99)
Glucose-Capillary: 119 mg/dL — ABNORMAL HIGH (ref 65–99)

## 2015-10-25 MED ORDER — IPRATROPIUM-ALBUTEROL 0.5-2.5 (3) MG/3ML IN SOLN
3.0000 mL | Freq: Four times a day (QID) | RESPIRATORY_TRACT | Status: DC
  Administered 2015-10-25 – 2015-10-26 (×3): 3 mL via RESPIRATORY_TRACT
  Filled 2015-10-25 (×4): qty 3

## 2015-10-25 MED ORDER — IPRATROPIUM-ALBUTEROL 0.5-2.5 (3) MG/3ML IN SOLN: 3.0000 mL | Freq: Three times a day (TID) | RESPIRATORY_TRACT | Status: DC

## 2015-10-25 MED ORDER — NAPHAZOLINE-GLYCERIN 0.012-0.2 % OP SOLN
1.0000 [drp] | Freq: Three times a day (TID) | OPHTHALMIC | Status: DC
Start: 2015-10-25 — End: 2015-10-26
  Administered 2015-10-25 (×2): 1 [drp] via OPHTHALMIC
  Administered 2015-10-26: 2 [drp] via OPHTHALMIC
  Filled 2015-10-25: qty 15

## 2015-10-25 NOTE — Care Management Note (Signed)
Case Management Note  Patient Details  Name: April [name] MRN: [id number] Date of Birth: 01-30-1971  Subjective/Objective:                 Patient in obs from home with asthma exacerbation. Patient states she gets her meds filled at [facility] because it is the least expensive for her. She stated her only difficulty was sometimes getting to pharmacy but her mother or sister provide her with transportation, or she takes the bus. She states that environmental allergies usually trigger her hospital visits. She states she does receive allergy meds. Other than that she could not identify any barriers to self care.    PCP: Glynn Octave, pulmonologist   Action/Plan:  CM will follow for DC planning needs.  Expected Discharge Date:  10/26/15               Expected Discharge Plan:  Home/Self Care  In-House Referral:     Discharge planning Services  CM Consult  Post Acute Care Choice:    Choice offered to:     DME Arranged:    DME Agency:     HH Arranged:    HH Agency:     Status of Service:  In process, will continue to follow  If discussed at Long Length of Stay Meetings, dates discussed:    Additional Comments:  Carles Collet, RN 10/25/2015, 12:22 PM

## 2015-10-25 NOTE — Progress Notes (Signed)
Subjective:  No acute events overnight. Still some wheezing Still right eye pain  Objective: Vital signs in last 24 hours: Filed Vitals:   10/25/15 0626 10/25/15 0822 10/25/15 1338 10/25/15 1430  BP: 161/80  145/81   Pulse: 81  89   Temp: 98.6 F (37 C)  98.1 F (36.7 C)   TempSrc:   Oral   Resp: 17  15   Height:      Weight:      SpO2: 98% 100% 99% 100%   Weight change:   Intake/Output Summary (Last 24 hours) at 10/25/15 1453 Last data filed at 10/24/15 1854  Gross per 24 hour  Intake    240 ml  Output      0 ml  Net    240 ml   General: Vital signs reviewed. Patient with some wheezing Cardiovascular: regular rate, rhythm, no murmur appreciated  Pulmonary/Chest: diffuse end expiratory wheezing Abdominal: morbidly obese   Lab Results: Results for orders placed or performed during the hospital encounter of 10/24/15 (from the past 24 hour(s))  Glucose, capillary     Status: Abnormal   Collection Time: 10/24/15  5:17 PM  Result Value Ref Range   Glucose-Capillary 173 (H) 65 - 99 mg/dL   Comment 1 Notify RN   Glucose, capillary     Status: Abnormal   Collection Time: 10/24/15 10:59 PM  Result Value Ref Range   Glucose-Capillary 142 (H) 65 - 99 mg/dL  Glucose, capillary     Status: Abnormal   Collection Time: 10/25/15  7:57 AM  Result Value Ref Range   Glucose-Capillary 111 (H) 65 - 99 mg/dL  Glucose, capillary     Status: Abnormal   Collection Time: 10/25/15 12:16 PM  Result Value Ref Range   Glucose-Capillary 140 (H) 65 - 99 mg/dL   *Note: Due to a large number of results and/or encounters for the requested time period, some results have not been displayed. A complete set of results can be found in Results Review.    Micro Results: No results found for this or any previous visit (from the past 240 hour(s)). Studies/Results: Dg Chest 2 View  10/24/2015  CLINICAL DATA:  Dyspnea and wheezing. EXAM: CHEST  2 VIEW COMPARISON:  08/22/2015 FINDINGS: There is  mild hyperinflation. The lungs are clear. The pulmonary vasculature is normal. There is no pleural effusion. IMPRESSION: Mild hyperinflation. Electronically Signed   By: Andreas Newport M.D.   On: 10/24/2015 06:33   Medications: I have reviewed the patient's current medications. Scheduled Meds: . budesonide (PULMICORT) nebulizer solution  0.25 mg Nebulization BID  . diclofenac sodium  4 g Topical QID  . enoxaparin (LOVENOX) injection  40 mg Subcutaneous Q24H  . hydrochlorothiazide  25 mg Oral Daily  . ipratropium-albuterol  3 mL Nebulization Q6H  . loratadine  10 mg Oral Daily  . meloxicam  7.5 mg Oral Q breakfast  . naphazoline-glycerin  1-2 drop Right Eye TID  . nicotine  14 mg Transdermal Daily  . pantoprazole  40 mg Oral Daily  . predniSONE  40 mg Oral Q breakfast  . ramelteon  8 mg Oral QHS  . sertraline  50 mg Oral Daily  . theophylline  300 mg Oral Daily   Continuous Infusions:  PRN Meds:.acetaminophen **OR** acetaminophen, albuterol, ibuprofen Assessment/Plan: Principal Problem:   Acute asthma exacerbation Active Problems:   Essential hypertension   Obstructive sleep apnea   Knee pain, bilateral   Seasonal allergic rhinitis   GERD (gastroesophageal  reflux disease)  Exacerbation of severe persistent asthma: Pt with frequent admissions in the past, and follows with Dr Annamaria Boots. Was on chronic prednisone. This morning, still wheezing on exam   -duonebs q6 hours scheduled  -budesonide bid -albuterol PRN q2 hours  -prednisone 40 mg and then taper to 10 mg solwly at outpatient -theophylline 300 mg daily  -follow up with Dr Annamaria Boots for Xolair   Eye itching: -naphazoline- glycerine eye drops TID   HTN -continue hctz  Dispo: Disposition is deferred at this time, awaiting improvement of current medical problems.  Anticipated discharge in approximately 1 day(s).   The patient does have a current PCP (Deneise Lever, MD) and does need an [facility] hospital follow-up appointment  after discharge.  The patient does not have transportation limitations that hinder transportation to clinic appointments.  .Services Needed at time of discharge: Y = Yes, Blank = No PT:   OT:   RN:   Equipment:   Other:       Burgess Estelle, MD 10/25/2015, 2:53 PM

## 2015-10-26 ENCOUNTER — Other Ambulatory Visit: Payer: Self-pay | Admitting: Pharmacist

## 2015-10-26 ENCOUNTER — Telehealth: Payer: Self-pay | Admitting: Internal Medicine

## 2015-10-26 DIAGNOSIS — J4551 Severe persistent asthma with (acute) exacerbation: Secondary | ICD-10-CM

## 2015-10-26 LAB — GLUCOSE, CAPILLARY
GLUCOSE-CAPILLARY: 105 mg/dL — AB (ref 65–99)
GLUCOSE-CAPILLARY: 129 mg/dL — AB (ref 65–99)

## 2015-10-26 MED ORDER — PREDNISONE 20 MG PO TABS: 40.0000 mg | ORAL_TABLET | Freq: Every day | ORAL | Status: DC

## 2015-10-26 MED ORDER — MOMETASONE FURO-FORMOTEROL FUM 200-5 MCG/ACT IN AERO: 2.0000 | INHALATION_SPRAY | Freq: Two times a day (BID) | RESPIRATORY_TRACT | Status: DC

## 2015-10-26 MED ORDER — THEOPHYLLINE ER 300 MG PO TB12: 300.0000 mg | ORAL_TABLET | Freq: Every day | ORAL | Status: DC

## 2015-10-26 MED ORDER — PREDNISONE 10 MG (21) PO TBPK: 10.0000 mg | ORAL_TABLET | Freq: Every day | ORAL | Status: DC

## 2015-10-26 MED ORDER — LORATADINE 10 MG PO TABS: 10.0000 mg | ORAL_TABLET | Freq: Every day | ORAL | Status: DC

## 2015-10-26 MED FILL — LORATADINE 10 MG TABLET: 10 | 30 days supply | Qty: 30 | Fill #0

## 2015-10-26 NOTE — Progress Notes (Signed)
Subjective:  She says she had "asthma attack" this morning which resolved after doing the nebs. Overall she is feeling much better and ready to go home . Less wheezing Says her right eye pain is much improved after the naphazolin eye drops  Discussed with Dr. Maudie Mercury- she will be provided with dulera, albuterol, theophylline samples.    Objective: Vital signs in last 24 hours: Filed Vitals:   10/26/15 0221 10/26/15 0531 10/26/15 0825 10/26/15 0848  BP:  133/71 137/84   Pulse:  83 87   Temp:  97.9 F (36.6 C)    TempSrc:      Resp:  16    Height:      Weight:      SpO2: 99% 100%  97%   Weight change:   Intake/Output Summary (Last 24 hours) at 10/26/15 1015 Last data filed at 10/26/15 0900  Gross per 24 hour  Intake    240 ml  Output      0 ml  Net    240 ml   General: Vital signs reviewed. Patient with some wheezing Cardiovascular: regular rate, rhythm, no murmur appreciated  Pulmonary/Chest: diffuse end expiratory wheezing improved from yesterday Abdominal: morbidly obese   Lab Results: Results for orders placed or performed during the hospital encounter of 10/24/15 (from the past 24 hour(s))  Glucose, capillary     Status: Abnormal   Collection Time: 10/25/15 12:16 PM  Result Value Ref Range   Glucose-Capillary 140 (H) 65 - 99 mg/dL  Glucose, capillary     Status: Abnormal   Collection Time: 10/25/15  5:59 PM  Result Value Ref Range   Glucose-Capillary 189 (H) 65 - 99 mg/dL  Glucose, capillary     Status: Abnormal   Collection Time: 10/25/15 10:15 PM  Result Value Ref Range   Glucose-Capillary 119 (H) 65 - 99 mg/dL  Glucose, capillary     Status: Abnormal   Collection Time: 10/26/15  7:48 AM  Result Value Ref Range   Glucose-Capillary 105 (H) 65 - 99 mg/dL   *Note: Due to a large number of results and/or encounters for the requested time period, some results have not been displayed. A complete set of results can be found in Results Review.    Micro  Results: No results found for this or any previous visit (from the past 240 hour(s)). Studies/Results: No results found. Medications: I have reviewed the patient's current medications. Scheduled Meds: . budesonide (PULMICORT) nebulizer solution  0.25 mg Nebulization BID  . diclofenac sodium  4 g Topical QID  . enoxaparin (LOVENOX) injection  40 mg Subcutaneous Q24H  . hydrochlorothiazide  25 mg Oral Daily  . ipratropium-albuterol  3 mL Nebulization Q6H  . loratadine  10 mg Oral Daily  . meloxicam  7.5 mg Oral Q breakfast  . naphazoline-glycerin  1-2 drop Right Eye TID  . nicotine  14 mg Transdermal Daily  . pantoprazole  40 mg Oral Daily  . predniSONE  40 mg Oral Q breakfast  . ramelteon  8 mg Oral QHS  . sertraline  50 mg Oral Daily  . theophylline  300 mg Oral Daily   Continuous Infusions:  PRN Meds:.acetaminophen **OR** acetaminophen, albuterol, ibuprofen Assessment/Plan: Principal Problem:   Acute asthma exacerbation Active Problems:   Essential hypertension   Obstructive sleep apnea   Knee pain, bilateral   Seasonal allergic rhinitis   GERD (gastroesophageal reflux disease)  Exacerbation of severe persistent asthma: Pt with frequent admissions in the past, and follows  with Dr Annamaria Boots. Was on chronic prednisone. This morning her wheezing has improved. She will be discharged with prednisone taper, budesonide, theophylline, and asked to follow up with Dr Annamaria Boots and in our clinic  -budesonide bid -albuterol PRN q4 hours on discharge -prednisone 40 mg and then taper to 10 mg solwly at outpatient -theophylline 300 mg daily  -follow up with Dr Annamaria Boots for Xolair  -discussed with Dr Maudie Mercury for samples on discharge  -Patient has not had a sleep study per the chart review. Recommend sleep study on discharge as it plays a role in asthma   Eye itching: -naphazoline- glycerine eye drops TID for next 3 days PRN  HTN -continue hctz    Dispo: Disposition is deferred at this time,  awaiting improvement of current medical problems.  Anticipated discharge in approximately 1 day(s).   The patient does have a current PCP (Deneise Lever, MD) and does need an [facility] hospital follow-up appointment after discharge.  The patient does not have transportation limitations that hinder transportation to clinic appointments.  .Services Needed at time of discharge: Y = Yes, Blank = No PT:   OT:   RN:   Equipment:   Other:       Burgess Estelle, MD 10/26/2015, 10:15 AM

## 2015-10-26 NOTE — Discharge Instructions (Signed)
Please follow the prednisone instructions as given on the prescription label. Starting July 12th, please resume your 10 mg dose of prednisone. Please follow up with Dr Annamaria Boots on July 17th and also in our clinic at the given time. Please STOP SMOKING PLEASE IF YOU WANT TO STOP BEING ADMITTED TO THE HOSPITAL  You can use the Clear eyes eye drops for 3 more days as needed for the red eyes  Please call your doctor ahead of time if you run out of the medicines

## 2015-10-26 NOTE — Discharge Summary (Signed)
Name: April [name] MRN: [id number] DOB: 09-04-1970 45 y.o. PCP: Deneise Lever, MD  Date of Admission: 10/24/2015  5:06 AM Date of Discharge: 10/26/2015 Attending Physician: Aldine Contes, MD  Discharge Diagnosis: 1. Severe persistent asthma  Principal Problem:   Acute asthma exacerbation Active Problems:   Essential hypertension   Obstructive sleep apnea   Knee pain, bilateral   Seasonal allergic rhinitis   GERD (gastroesophageal reflux disease)   Discharge Medications:   Medication List    STOP taking these medications        HYDROcodone-acetaminophen 5-325 MG tablet  Commonly known as:  NORCO     meloxicam 7.5 MG tablet  Commonly known as:  MOBIC      TAKE these medications        acetaminophen 500 MG tablet  Commonly known as:  TYLENOL  Take 2 tablets (1,000 mg total) by mouth every 6 (six) hours as needed.     ADVIL PM 200-25 MG Caps  Generic drug:  Ibuprofen-Diphenhydramine HCl  Take 2-4 tablets by mouth at bedtime as needed (pain).     albuterol (2.5 MG/3ML) 0.083% nebulizer solution  Commonly known as:  PROVENTIL  Take 3 mLs (2.5 mg total) by nebulization every 6 (six) hours as needed for wheezing or shortness of breath.     albuterol 108 (90 Base) MCG/ACT inhaler  Commonly known as:  PROVENTIL HFA;VENTOLIN HFA  Inhale 1-2 puffs into the lungs every 4 (four) hours as needed for wheezing or shortness of breath.     esomeprazole 40 MG capsule  Commonly known as:  NEXIUM  Take 1 capsule (40 mg total) by mouth daily.     hydrochlorothiazide 25 MG tablet  Commonly known as:  HYDRODIURIL  Take 1 tablet (25 mg total) by mouth daily.     ipratropium 0.02 % nebulizer solution  Commonly known as:  ATROVENT  Take 2.5 mLs (0.5 mg total) by nebulization 4 (four) times daily as needed for wheezing or shortness of breath.     Liraglutide -Weight Management 18 MG/3ML Sopn  Inject 0.6 mg into the skin daily. For one week, then 1.2mg  daily for one week, then  1.8mg  daily for 1 week, then 2.4mg  daily for 1 week, then 3mg  daily thereafter     loratadine 10 MG tablet  Commonly known as:  CLARITIN  Take 1 tablet (10 mg total) by mouth daily.     mometasone-formoterol 200-5 MCG/ACT Aero  Commonly known as:  DULERA  Inhale 2 puffs into the lungs 2 (two) times daily.     omalizumab 150 MG injection  Commonly known as:  XOLAIR  Inject 150 mg into the skin every 14 (fourteen) days.     predniSONE 20 MG tablet  Commonly known as:  DELTASONE  Take 2 tablets (40 mg total) by mouth daily with breakfast. On July 2, take 1.5 tablets daily, then on July 7 take 1 tab daily until July 11     predniSONE 10 MG (21) Tbpk tablet  Commonly known as:  STERAPRED UNI-PAK 21 TAB  Take 1 tablet (10 mg total) by mouth daily. Starting July 12  Start taking on:  11/08/2015     sertraline 50 MG tablet  Commonly known as:  ZOLOFT  Take 1 tablet (50 mg total) by mouth daily.     theophylline 300 MG 12 hr tablet  Commonly known as:  THEODUR  Take 1 tablet (300 mg total) by mouth daily.  Disposition and follow-up:   April [name] was discharged from [facility] in Good condition.  At the hospital follow up visit please address:  Severe persistent asthma: Patient sent home on budesonide, theophylline, and prednisone taper. Please make sure she is on prednisone taper. She is to follow up with Dr Annamaria Boots to decide whether to be on long term steroids and for biweekly Xolair injections. She needs to quit smoking in entirety.   Morbid severe Obesity: recommend sleep study due to sleep apnea that might be contributing to her "asthma-like" symptoms of dyspnea.  She may benefit from bariatric surgery, or orlistat .    2.  Labs / imaging needed at time of follow-up:   3.  Pending labs/ test needing follow-up:   Follow-up Appointments:     Follow-up Information    Follow up with Deneise Lever, MD. Go on 11/13/2015.   Specialty:  Pulmonary  Disease   Why:  9 AM follow up    Contact information:   [address] [place] [address] [phone]       Follow up with [facility]. Go on 11/09/2015.   Why:  10:45 AM   Contact information:   [address] [place] [id number]     HPI:  April [name] is a 45 y.o. female who has a past medical history of PMH of severe persistent asthma on chronic steroids, OSA, allergies, GERD, depression, and morbid obesity who presents with increased wheezing and shortness of breath. She is frequently admitted for exacerbations, with the most recent hospitalization occuring in April. No history of prior intubation. Three days ago she felt like she was getting sick with a cold with symptoms of dyspnea, wheezing, and nonproductive cough. No URI symptoms but reports some generalized body aches and HA. No fever/chills, chest pain, N/V, abdominal pain. She reports compliance with meds although upon questioning she ran out of theophylline 1-2 weeks ago. She started using prednisone on Saturday and took 1-2 tablets. She has been using her nebulizer more frequently since she began feeling worse, reports using 2x yesterday. Has not been checking peak flows. No sick contacts or recent long distance travel.[name] She works as a Engineer, petroleum at [employer]. She still smokes on occasion, with the last cigarette being ~2 weeks ago. Denies alcohol or recreational drug use. Of notes, she also is on xolair for allergies and gets injections every 2 weeks and is scheduled for one on Friday.   In the ED, [facility]. She was given solumedrol 125mg , duonebs, and magnesium. CXR with mild hyperinflation. Mild leukocytosis of 11.9.   Hospital Course by problem list: Principal Problem:   Acute asthma exacerbation Active Problems:   Essential hypertension   Obstructive sleep apnea   Knee pain, bilateral   Seasonal allergic rhinitis   GERD (gastroesophageal reflux  disease)   Exacerbation of severe persistent asthma: Pt with frequent admissions in the past, and follows with Dr Annamaria Boots. Was on chronic prednisone. On discharge day, her wheezing has improved. She will be discharged with prednisone taper, budesonide, theophylline, and asked to follow up with Dr Annamaria Boots and in our clinic. Given samples from our clinic.   Eye itching: She complained of eye itching after something hit her. Experienced relief after naphazoline- glycerine eye drops were given. Prescribed for  TID for next 3 days PRN  HTN: HCTZ continued  Discharge Vitals:   BP 137/84 mmHg  Pulse 87  Temp(Src) 97.9  F (36.6 C) (Oral)  Resp 16  Ht 5\' 5"  (1.651 m)  Wt 329 lb (149.233 kg)  BMI 54.75 kg/m2  SpO2 97%  LMP 09/05/2015   Discharge Instructions: Discharge Instructions    Call MD for:  difficulty breathing, headache or visual disturbances    Complete by:  As directed      Diet - low sodium heart healthy    Complete by:  As directed      Increase activity slowly    Complete by:  As directed            Signed: Burgess Estelle, MD 10/26/2015, 11:02 AM

## 2015-10-26 NOTE — Progress Notes (Unsigned)
Medication Samples have been provided to the patient.  Drug: mometasone-formoterol Ruthe Mannan) Strength: 200/5 mcg Qty: 1 [id number] Exp.Date: 04/16/16 Dosing instructions: 2 puffs BID  Drug: albuterol (Proventil) Strength: 90 mcg/puff Qty: 1 LOT: [id number] Exp.Date: 06/2017 Dosing instructions: 1 to 2 puffs Q 4 hours  Drug: theophylline XR 12 hour Strength: 300 mg Qty: 4 LOT: [id number] Exp. Date: 06/27/16 Dosing: 1 tablet daily  The patient has been instructed regarding the correct time, dose, and frequency of taking this medication, including desired effects and most common side effects.   Will follow up with patient's pulmonary team with recommendations.  Kim,Jennifer J 10:48 AM 10/26/2015

## 2015-10-26 NOTE — Telephone Encounter (Signed)
Pt need TOC Discharge date 10/26/15 HFU 11/09/15

## 2015-11-01 NOTE — Telephone Encounter (Signed)
No answer, no vmail 

## 2015-11-02 NOTE — Telephone Encounter (Signed)
No answer, no vmail 

## 2015-11-03 NOTE — Telephone Encounter (Signed)
No answer, no VM

## 2015-11-09 ENCOUNTER — Ambulatory Visit: Payer: Self-pay

## 2015-11-09 ENCOUNTER — Encounter: Payer: Self-pay | Admitting: Internal Medicine

## 2015-11-10 ENCOUNTER — Encounter (HOSPITAL_COMMUNITY): Payer: Self-pay | Admitting: Emergency Medicine

## 2015-11-10 ENCOUNTER — Observation Stay (HOSPITAL_COMMUNITY)
Admission: EM | Admit: 2015-11-10 | Discharge: 2015-11-12 | Disposition: A | Discharge status: Home / Self Care | Payer: Self-pay | Attending: Internal Medicine | Admitting: Internal Medicine

## 2015-11-10 ENCOUNTER — Emergency Department (HOSPITAL_COMMUNITY): Payer: Self-pay

## 2015-11-10 DIAGNOSIS — Z6841 Body Mass Index (BMI) 40.0 and over, adult: Secondary | ICD-10-CM | POA: Insufficient documentation

## 2015-11-10 DIAGNOSIS — K219 Gastro-esophageal reflux disease without esophagitis: Secondary | ICD-10-CM | POA: Insufficient documentation

## 2015-11-10 DIAGNOSIS — M1990 Unspecified osteoarthritis, unspecified site: Secondary | ICD-10-CM | POA: Insufficient documentation

## 2015-11-10 DIAGNOSIS — J449 Chronic obstructive pulmonary disease, unspecified: Secondary | ICD-10-CM | POA: Insufficient documentation

## 2015-11-10 DIAGNOSIS — J4489 Other specified chronic obstructive pulmonary disease: Secondary | ICD-10-CM

## 2015-11-10 DIAGNOSIS — I119 Hypertensive heart disease without heart failure: Secondary | ICD-10-CM | POA: Insufficient documentation

## 2015-11-10 DIAGNOSIS — I10 Essential (primary) hypertension: Secondary | ICD-10-CM

## 2015-11-10 DIAGNOSIS — J4551 Severe persistent asthma with (acute) exacerbation: Principal | ICD-10-CM | POA: Insufficient documentation

## 2015-11-10 DIAGNOSIS — F17210 Nicotine dependence, cigarettes, uncomplicated: Secondary | ICD-10-CM | POA: Insufficient documentation

## 2015-11-10 DIAGNOSIS — G4700 Insomnia, unspecified: Secondary | ICD-10-CM | POA: Insufficient documentation

## 2015-11-10 DIAGNOSIS — J45901 Unspecified asthma with (acute) exacerbation: Secondary | ICD-10-CM

## 2015-11-10 DIAGNOSIS — F329 Major depressive disorder, single episode, unspecified: Secondary | ICD-10-CM | POA: Insufficient documentation

## 2015-11-10 LAB — COMPREHENSIVE METABOLIC PANEL
ALT: 18 U/L (ref 14–54)
ANION GAP: 7 (ref 5–15)
AST: 15 U/L (ref 15–41)
Albumin: 3 g/dL — ABNORMAL LOW (ref 3.5–5.0)
Alkaline Phosphatase: 63 U/L (ref 38–126)
BUN: 9 mg/dL (ref 6–20)
CHLORIDE: 103 mmol/L (ref 101–111)
CO2: 28 mmol/L (ref 22–32)
Calcium: 8.7 mg/dL — ABNORMAL LOW (ref 8.9–10.3)
Creatinine, Ser: 0.56 mg/dL (ref 0.44–1.00)
Glucose, Bld: 130 mg/dL — ABNORMAL HIGH (ref 65–99)
POTASSIUM: 2.9 mmol/L — AB (ref 3.5–5.1)
Sodium: 138 mmol/L (ref 135–145)
Total Bilirubin: 0.4 mg/dL (ref 0.3–1.2)
Total Protein: 5.7 g/dL — ABNORMAL LOW (ref 6.5–8.1)

## 2015-11-10 LAB — CBC
HCT: 38.2 % (ref 36.0–46.0)
Hemoglobin: 11.9 g/dL — ABNORMAL LOW (ref 12.0–15.0)
MCH: 27.9 pg (ref 26.0–34.0)
MCHC: 31.2 g/dL (ref 30.0–36.0)
MCV: 89.5 fL (ref 78.0–100.0)
PLATELETS: 377 10*3/uL (ref 150–400)
RBC: 4.27 MIL/uL (ref 3.87–5.11)
RDW: 16.3 % — AB (ref 11.5–15.5)
WBC: 19.9 10*3/uL — AB (ref 4.0–10.5)

## 2015-11-10 LAB — POC URINE PREG, ED: PREG TEST UR: NEGATIVE

## 2015-11-10 MED ORDER — IPRATROPIUM-ALBUTEROL 0.5-2.5 (3) MG/3ML IN SOLN
3.0000 mL | Freq: Once | RESPIRATORY_TRACT | Status: AC
  Administered 2015-11-10: 3 mL via RESPIRATORY_TRACT
  Filled 2015-11-10: qty 3

## 2015-11-10 MED ORDER — POTASSIUM CHLORIDE CRYS ER 20 MEQ PO TBCR
30.0000 meq | EXTENDED_RELEASE_TABLET | ORAL | Status: AC
  Administered 2015-11-10 (×2): 30 meq via ORAL
  Filled 2015-11-10 (×2): qty 1

## 2015-11-10 MED ORDER — BUDESONIDE 0.25 MG/2ML IN SUSP
0.2500 mg | Freq: Two times a day (BID) | RESPIRATORY_TRACT | Status: DC
  Administered 2015-11-10 – 2015-11-12 (×4): 0.25 mg via RESPIRATORY_TRACT
  Filled 2015-11-10 (×4): qty 2

## 2015-11-10 MED ORDER — ENOXAPARIN SODIUM 40 MG/0.4ML ~~LOC~~ SOLN: 40.0000 mg | SUBCUTANEOUS | Status: DC

## 2015-11-10 MED ORDER — ALBUTEROL SULFATE (2.5 MG/3ML) 0.083% IN NEBU
5.0000 mg | INHALATION_SOLUTION | Freq: Once | RESPIRATORY_TRACT | Status: AC
  Administered 2015-11-10: 5 mg via RESPIRATORY_TRACT

## 2015-11-10 MED ORDER — ACETAMINOPHEN 325 MG PO TABS
650.0000 mg | ORAL_TABLET | Freq: Four times a day (QID) | ORAL | Status: DC | PRN
  Administered 2015-11-10 – 2015-11-12 (×4): 650 mg via ORAL
  Filled 2015-11-10 (×4): qty 2

## 2015-11-10 MED ORDER — METHYLPREDNISOLONE SODIUM SUCC 125 MG IJ SOLR
125.0000 mg | Freq: Once | INTRAMUSCULAR | Status: AC
  Administered 2015-11-10: 125 mg via INTRAVENOUS
  Filled 2015-11-10: qty 2

## 2015-11-10 MED ORDER — ALBUTEROL SULFATE (2.5 MG/3ML) 0.083% IN NEBU
INHALATION_SOLUTION | RESPIRATORY_TRACT | Status: AC
  Filled 2015-11-10: qty 6

## 2015-11-10 MED ORDER — ARFORMOTEROL TARTRATE 15 MCG/2ML IN NEBU
15.0000 ug | INHALATION_SOLUTION | Freq: Two times a day (BID) | RESPIRATORY_TRACT | Status: DC
  Administered 2015-11-10 – 2015-11-12 (×4): 15 ug via RESPIRATORY_TRACT
  Filled 2015-11-10 (×4): qty 2

## 2015-11-10 MED ORDER — LORATADINE 10 MG PO TABS
10.0000 mg | ORAL_TABLET | Freq: Every day | ORAL | Status: DC
  Administered 2015-11-10 – 2015-11-12 (×3): 10 mg via ORAL
  Filled 2015-11-10 (×3): qty 1

## 2015-11-10 MED ORDER — IPRATROPIUM-ALBUTEROL 0.5-2.5 (3) MG/3ML IN SOLN
3.0000 mL | Freq: Four times a day (QID) | RESPIRATORY_TRACT | Status: DC
  Administered 2015-11-10 – 2015-11-12 (×8): 3 mL via RESPIRATORY_TRACT
  Filled 2015-11-10 (×8): qty 3

## 2015-11-10 MED ORDER — ALBUTEROL SULFATE (2.5 MG/3ML) 0.083% IN NEBU
2.5000 mg | INHALATION_SOLUTION | RESPIRATORY_TRACT | Status: DC | PRN
  Administered 2015-11-11 – 2015-11-12 (×2): 2.5 mg via RESPIRATORY_TRACT
  Filled 2015-11-10 (×2): qty 3

## 2015-11-10 MED ORDER — SERTRALINE HCL 50 MG PO TABS
50.0000 mg | ORAL_TABLET | Freq: Every day | ORAL | Status: DC
  Administered 2015-11-10 – 2015-11-12 (×3): 50 mg via ORAL
  Filled 2015-11-10 (×3): qty 1

## 2015-11-10 MED ORDER — NICOTINE 7 MG/24HR TD PT24
7.0000 mg | MEDICATED_PATCH | Freq: Every day | TRANSDERMAL | Status: DC
  Administered 2015-11-10 – 2015-11-11 (×2): 7 mg via TRANSDERMAL
  Filled 2015-11-10 (×2): qty 1

## 2015-11-10 MED ORDER — THEOPHYLLINE ER 300 MG PO TB12
300.0000 mg | ORAL_TABLET | Freq: Every day | ORAL | Status: DC
  Administered 2015-11-10 – 2015-11-12 (×3): 300 mg via ORAL
  Filled 2015-11-10 (×3): qty 1

## 2015-11-10 MED ORDER — SENNOSIDES-DOCUSATE SODIUM 8.6-50 MG PO TABS: 1.0000 | ORAL_TABLET | Freq: Every evening | ORAL | Status: DC | PRN

## 2015-11-10 MED ORDER — IPRATROPIUM-ALBUTEROL 0.5-2.5 (3) MG/3ML IN SOLN
RESPIRATORY_TRACT | Status: AC
  Filled 2015-11-10: qty 3

## 2015-11-10 MED ORDER — RAMELTEON 8 MG PO TABS
8.0000 mg | ORAL_TABLET | Freq: Every day | ORAL | Status: DC
  Administered 2015-11-10 – 2015-11-11 (×2): 8 mg via ORAL
  Filled 2015-11-10 (×3): qty 1

## 2015-11-10 MED ORDER — PANTOPRAZOLE SODIUM 40 MG PO TBEC
40.0000 mg | DELAYED_RELEASE_TABLET | Freq: Every day | ORAL | Status: DC
  Administered 2015-11-10: 40 mg via ORAL
  Filled 2015-11-10: qty 1

## 2015-11-10 MED ORDER — ENOXAPARIN SODIUM 80 MG/0.8ML ~~LOC~~ SOLN
0.5000 mg/kg | SUBCUTANEOUS | Status: DC
  Administered 2015-11-10: 75 mg via SUBCUTANEOUS
  Filled 2015-11-10: qty 0.8

## 2015-11-10 MED ORDER — HYDROCHLOROTHIAZIDE 25 MG PO TABS
25.0000 mg | ORAL_TABLET | Freq: Every day | ORAL | Status: DC
  Administered 2015-11-10 – 2015-11-12 (×3): 25 mg via ORAL
  Filled 2015-11-10 (×3): qty 1

## 2015-11-10 MED ORDER — ACETAMINOPHEN 650 MG RE SUPP: 650.0000 mg | Freq: Four times a day (QID) | RECTAL | Status: DC | PRN

## 2015-11-10 MED ORDER — PREDNISONE 20 MG PO TABS
60.0000 mg | ORAL_TABLET | Freq: Every day | ORAL | Status: DC
  Administered 2015-11-10: 60 mg via ORAL
  Filled 2015-11-10: qty 3

## 2015-11-10 MED ORDER — MAGNESIUM SULFATE 2 GM/50ML IV SOLN
2.0000 g | Freq: Once | INTRAVENOUS | Status: AC
  Administered 2015-11-10: 2 g via INTRAVENOUS
  Filled 2015-11-10: qty 50

## 2015-11-10 NOTE — H&P (Signed)
Date: 11/10/2015               Patient Name:  April [name] MRN: [id number]  DOB: 18-Nov-1970 Age / Sex: 45 y.o., female   PCP: Deneise Lever, MD         Medical Service: Internal Medicine Teaching Service         Attending Physician: Dr. Oval Linsey, MD    First Contact: Dr. Velna Ochs Pager: [phone]  Second Contact: Dr. Jacques Earthly  Pager: [phone]       After Hours (After 5p/  First Contact Pager: [phone]  weekends / holidays): Second Contact Pager: [phone]   Chief Complaint: SOB  History of Present Illness: April [name] is a 45 yo F with a pmhx of severe persistent asthma, on budesonide, theophylline, and Xolair injections who presents with progressively worsening SOB and wheezing over the past 4 days. Patient reports increased use of her home inhalers, Dulera 3-4 times a day and rescue inhaler up to 10 times a day with no relief. She denies chest pain other than chest tightness. She does endorse an episode of emesis early today which she believes is from her heartburn. Patient was recently discharged on 6/29 for a similar exacerbation and was sent home on a prednisone taper which she finished last night. Today she was scheduled for a Xolair in injection however missed the appointment due to transportation issues. Overall, she reports worsening of her asthma symptoms over the past 6 months. Since December she says she has been to the ER almost once a month for symptom management. She denies every being intubated for her asthma.   On arrival to the ED today she was tachypneic 25, tachycardic 104, hypertensive 192/103, and sating 100% on RA. CXR showed chronic bronchitic changes without infiltrate, however CBC with a leukocytosis of 19.9. EKG sinus rhythm with no ischemic changes. In the ED she received 3 duoneb treatments, 125mg  solumedrol, and 2mg  magnesium with some relief of symptoms.   Meds: Current Facility-Administered Medications  Medication Dose Route Frequency  Provider Last Rate Last Dose  . acetaminophen (TYLENOL) tablet 650 mg  650 mg Oral Q6H PRN [name], MD       Or  . acetaminophen (TYLENOL) suppository 650 mg  650 mg Rectal Q6H PRN [name], MD      . albuterol (PROVENTIL) (2.5 MG/3ML) 0.083% nebulizer solution 2.5 mg  2.5 mg Nebulization Q2H PRN [name], MD      . enoxaparin (LOVENOX) injection 40 mg  40 mg Subcutaneous Q24H [name], MD      . hydrochlorothiazide (HYDRODIURIL) tablet 25 mg  25 mg Oral Daily [name], MD      . ipratropium-albuterol (DUONEB) 0.5-2.5 (3) MG/3ML nebulizer solution 3 mL  3 mL Nebulization Q6H [name], MD      . loratadine (CLARITIN) tablet 10 mg  10 mg Oral Daily [name], MD      . nicotine (NICODERM CQ - dosed in mg/24 hr) patch 7 mg  7 mg Transdermal Daily [name], MD      . pantoprazole (PROTONIX) EC tablet 40 mg  40 mg Oral Daily [name], MD      . Derrill Memo ON 11/11/2015] predniSONE (DELTASONE) tablet 60 mg  60 mg Oral Q breakfast [name], MD      . senna-docusate (Senokot-S) tablet 1 tablet  1 tablet Oral QHS PRN Tobie Lords  Randell Patient, MD      . sertraline (ZOLOFT) tablet 50 mg  50 mg Oral Daily [name], MD       Current Outpatient Prescriptions  Medication Sig Dispense Refill  . acetaminophen (TYLENOL) 500 MG tablet Take 2 tablets (1,000 mg total) by mouth every 6 (six) hours as needed. (Patient taking differently: Take 1,000 mg by mouth every 6 (six) hours as needed for mild pain. ) 90 tablet 3  . albuterol (PROVENTIL HFA;VENTOLIN HFA) 108 (90 Base) MCG/ACT inhaler Inhale 1-2 puffs into the lungs every 4 (four) hours as needed for wheezing or shortness of breath. 6 Inhaler 5  . albuterol (PROVENTIL) (2.5 MG/3ML) 0.083% nebulizer solution Take 3 mLs (2.5 mg total) by nebulization every 6 (six) hours as needed for wheezing or shortness of breath. 150 mL 1  . esomeprazole (NEXIUM) 40 MG capsule Take 1 capsule (40 mg total)  by mouth daily. 90 capsule 3  . hydrochlorothiazide (HYDRODIURIL) 25 MG tablet Take 1 tablet (25 mg total) by mouth daily. 90 tablet 3  . Ibuprofen-Diphenhydramine HCl (ADVIL PM) 200-25 MG CAPS Take 2-4 tablets by mouth at bedtime as needed (pain).    [name] ipratropium (ATROVENT) 0.02 % nebulizer solution Take 2.5 mLs (0.5 mg total) by nebulization 4 (four) times daily as needed for wheezing or shortness of breath. 75 mL 2  . Liraglutide -Weight Management 18 MG/3ML SOPN Inject 0.6 mg into the skin daily. For one week, then 1.2mg  daily for one week, then 1.8mg  daily for 1 week, then 2.4mg  daily for 1 week, then 3mg  daily thereafter (Patient not taking: Reported on 10/24/2015) 6 pen 1  . loratadine (CLARITIN) 10 MG tablet Take 1 tablet (10 mg total) by mouth daily. 90 tablet 3  . mometasone-formoterol (DULERA) 200-5 MCG/ACT AERO Inhale 2 puffs into the lungs 2 (two) times daily. 3 Inhaler 5  . omalizumab (XOLAIR) 150 MG injection Inject 150 mg into the skin every 14 (fourteen) days.    [name] sertraline (ZOLOFT) 50 MG tablet Take 1 tablet (50 mg total) by mouth daily. 90 tablet 3  . theophylline (THEODUR) 300 MG 12 hr tablet Take 1 tablet (300 mg total) by mouth daily. 90 tablet 3    Allergies: Allergies as of 11/10/2015  . (No Known Allergies)   Past Medical History  Diagnosis Date  . Acanthosis nigricans   . Menorrhagia   . Hypertension, essential   . Insomnia   . Morbid obesity ([place])   . Helicobacter pylori (H. pylori) infection   . Sleep apnea     Sleep study 2008 - mild OSA, not enough events to titrate CPAP  . COPD (chronic obstructive pulmonary disease) ([place])     PFTs in 2002, FEV1/FVC 65, no post bronchodilater test done  . Asthma     Followed by Dr. Annamaria Boots (pulmonology); receives every other week omalizumab injections; has frequent exacerbations  . Depression   . GERD (gastroesophageal reflux disease)   . Tobacco user   . Obesity   . Shortness of breath   . Headache(784.0)   .  Seasonal allergies   . Arthritis     Family History: HTN, Asthma   Social History: Lives alone, denies alcohol use. Former smoker, quit 2 years ago. Previously smoked 8-10 cigarettes a day for 18 years.   Review of Systems: A complete ROS was negative except as per HPI.   Physical Exam: Blood pressure 157/98, pulse 88, temperature 98.7 F (37.1 C), temperature source Oral, resp. rate  26, height 5\' 5"  (1.651 m), weight 300 lb (136.079 kg), last menstrual period 09/05/2015, SpO2 100 %.   Constitutional: NAD, speaking full sentences  HEENT: normocephalic, atraumatic. Neck supple. Anicteric sclera.  Cardiovascular: RRR. No murmurs, rubs, or gallops.  Respiratory: Moderate inspiratory and expiratory wheezing in all lung fields bilaterally, no crackles or rhonchi.  Gastrointestinal: Soft, non tender, non distended. +BS Extremities: No LE edema, distal pulses intact.  Integumentary: No rashes or lesions  Neurological: Alert and oriented x3. CN II- XII grossly intact Psych: Mood and affect normal   EKG: Normal sinus rhythm with R atrial enlargement  11/10/15 CXR 2 View:  FINDINGS: Normal heart size, mediastinal contours, and pulmonary vascularity.  Mild chronic bronchitic changes.  Lungs otherwise clear.  No pleural effusion or pneumothorax.  No acute bony abnormalities.  IMPRESSION: Chronic bronchitic changes without infiltrate.  Assessment & Plan by Problem:  Asthma Exacerbation: History of severe, persistent asthma on budesonide, theophylline, and Xolair injections. Recent hospitalization for similar exacerbation. Discharged on 6/29 with prednisone taper which she finished yesterday. Reports worsening symptoms of SOB and wheezing at home over the past few days and increased frequency of rescue inhaler use without relief.  - Proventil q2h PRN, Duonebs q6hr PRN - Prednisone 60mg  daily for 5 days  GERD: - Pantoprazole 40mg  daily  HTN: - Continue HCTZ 25 daily    Allergic Rhinitis: - Continue home Claritin   Depression: - Continue home Zoloft 50 daily   Dispo: Admit patient to Observation with expected length of stay less than 2 midnights.  Signed: Velna Ochs, MD 11/10/2015, 3:56 PM  Pager: [phone]

## 2015-11-10 NOTE — Progress Notes (Signed)
April [name] [id number] Admission Data: 11/10/2015 7:46 PM Attending Provider: Oval Linsey, MD  [id number], MD Consults/ Treatment Team:    April [name] is a 45 y.o. female patient admitted from ED awake, alert  & orientated  X 3,  Full Code, VSS - Blood pressure 146/73, pulse 92, temperature 98.7 F (37.1 C), temperature source Oral, resp. rate 22, height 5\' 5"  (1.651 m), weight 153.1 kg (337 lb 8.4 oz), last menstrual period 09/05/2015, SpO2 96 %., O2    2 L nasal cannular, no c/o shortness of breath, no c/o chest pain, no distress noted. Tele # 5 placed and pt is currently running:normal sinus rhythm.   IV site WDL:  hand right, condition patent and no redness with a transparent dsg that's clean dry and intact.  Allergies:  No Known Allergies   Past Medical History  Diagnosis Date  . Acanthosis nigricans   . Menorrhagia   . Hypertension, essential   . Insomnia   . Morbid obesity ([place])   . Helicobacter pylori (H. pylori) infection   . Sleep apnea     Sleep study 2008 - mild OSA, not enough events to titrate CPAP  . COPD (chronic obstructive pulmonary disease) ([place])     PFTs in 2002, FEV1/FVC 65, no post bronchodilater test done  . Asthma     Followed by Dr. Annamaria Boots (pulmonology); receives every other week omalizumab injections; has frequent exacerbations  . Depression   . GERD (gastroesophageal reflux disease)   . Tobacco user   . Obesity   . Shortness of breath   . Headache(784.0)   . Seasonal allergies   . Arthritis     History:  obtained from chart review. Tobacco/alcohol: Tobacco use/ none  Pt orientation to unit, room and routine. Information packet given to patient/family and safety video watched.  Admission INP armband ID verified with patient/family, and in place. SR up x 2, fall risk assessment complete with Patient and family verbalizing understanding of risks associated with falls. Pt verbalizes an understanding of how to use the call bell and to call for  help before getting out of bed.  Skin, clean-dry- intact without evidence of bruising, or skin tears.   No evidence of skin break down noted on exam. no rashes, no ecchymoses, no petechiae, no nodules, no jaundice, no purpura, no wounds    Will cont to monitor and assist as needed.  Salley Slaughter, RN 11/10/2015 7:46 PM

## 2015-11-10 NOTE — ED Provider Notes (Signed)
I saw and evaluated the patient, reviewed the resident's note and I agree with the findings and plan.   EKG Interpretation None     Patient here with exacerbation of her chronic asthma which became acutely worse today. On exam she has audible wheezing. She received multiple doses of albuterol prior to arrival. Will be admitted to the hospitalist service  Lacretia Leigh, MD 11/10/15 1221

## 2015-11-10 NOTE — ED Provider Notes (Signed)
CSN: [id number]     Arrival date & time 11/10/15  1018 History   First MD Initiated Contact with Patient 11/10/15 1110     Chief Complaint  Patient presents with  . Asthma     (Consider location/radiation/quality/duration/timing/severity/associated sxs/prior Treatment) Patient is a 45 y.o. female presenting with asthma. The history is provided by the patient.  Asthma This is a recurrent problem. The current episode started yesterday. The problem occurs daily. The problem has been unchanged. Associated symptoms include coughing and headaches. Pertinent negatives include no abdominal pain, anorexia, arthralgias, chest pain, congestion, diaphoresis, fever, nausea, rash, vomiting or weakness. Treatments tried: home albuterol. The treatment provided no relief.    Past Medical History  Diagnosis Date  . Acanthosis nigricans   . Menorrhagia   . Hypertension, essential   . Insomnia   . Morbid obesity ([place])   . Helicobacter pylori (H. pylori) infection   . Sleep apnea     Sleep study 2008 - mild OSA, not enough events to titrate CPAP  . COPD (chronic obstructive pulmonary disease) ([place])     PFTs in 2002, FEV1/FVC 65, no post bronchodilater test done  . Asthma     Followed by Dr. Annamaria Boots (pulmonology); receives every other week omalizumab injections; has frequent exacerbations  . Depression   . GERD (gastroesophageal reflux disease)   . Tobacco user   . Obesity   . Shortness of breath   . Headache(784.0)   . Seasonal allergies   . Arthritis    Past Surgical History  Procedure Laterality Date  . Tubal ligation  1996    bilateral  . Breast reduction surgery  09/2011   Family History  Problem Relation Age of Onset  . Asthma Daughter   . Cancer Paternal Aunt   . Asthma Maternal Grandmother   . Hypertension Mother    Social History  Substance Use Topics  . Smoking status: Former Smoker -- 18 years    Types: Cigarettes    Quit date: 09/15/2014  . Smokeless tobacco: Former Systems developer     Quit date: 09/12/2014  . Alcohol Use: No   OB History    No data available     Review of Systems  Constitutional: Negative for fever and diaphoresis.  HENT: Negative for congestion.   Eyes: Negative for visual disturbance.  Respiratory: Positive for cough.   Cardiovascular: Negative for chest pain.  Gastrointestinal: Negative for nausea, vomiting, abdominal pain and anorexia.  Genitourinary: Negative for dysuria, flank pain and difficulty urinating.  Musculoskeletal: Negative for arthralgias and gait problem.  Skin: Negative for rash.  Neurological: Positive for light-headedness and headaches. Negative for weakness.  Psychiatric/Behavioral: Negative for confusion and agitation.      Allergies  Review of patient's allergies indicates no known allergies.  Home Medications   Prior to Admission medications   Medication Sig Start Date End Date Taking? Authorizing Provider  acetaminophen (TYLENOL) 500 MG tablet Take 2 tablets (1,000 mg total) by mouth every 6 (six) hours as needed. Patient taking differently: Take 1,000 mg by mouth every 6 (six) hours as needed for mild pain.  08/30/15   [name], MD  albuterol (PROVENTIL HFA;VENTOLIN HFA) 108 (90 Base) MCG/ACT inhaler Inhale 1-2 puffs into the lungs every 4 (four) hours as needed for wheezing or shortness of breath. 08/30/15   [name], MD  albuterol (PROVENTIL) (2.5 MG/3ML) 0.083% nebulizer solution Take 3 mLs (2.5 mg total) by nebulization every 6 (six) hours as needed for wheezing or shortness of  breath. 08/30/15   [name], MD  esomeprazole (NEXIUM) 40 MG capsule Take 1 capsule (40 mg total) by mouth daily. 08/30/15 08/29/16  [name], MD  hydrochlorothiazide (HYDRODIURIL) 25 MG tablet Take 1 tablet (25 mg total) by mouth daily. 08/30/15   [name], MD  Ibuprofen-Diphenhydramine HCl (ADVIL PM) 200-25 MG CAPS Take 2-4 tablets by mouth at bedtime as needed (pain).    Historical Provider, MD  ipratropium (ATROVENT) 0.02 %  nebulizer solution Take 2.5 mLs (0.5 mg total) by nebulization 4 (four) times daily as needed for wheezing or shortness of breath. 08/30/15   [name], MD  Liraglutide -Weight Management 18 MG/3ML SOPN Inject 0.6 mg into the skin daily. For one week, then 1.2mg  daily for one week, then 1.8mg  daily for 1 week, then 2.4mg  daily for 1 week, then 3mg  daily thereafter Patient not taking: Reported on 10/24/2015 08/30/15   [name], MD  loratadine (CLARITIN) 10 MG tablet Take 1 tablet (10 mg total) by mouth daily. 08/30/15   [name], MD  mometasone-formoterol [facility]) 200-5 MCG/ACT AERO Inhale 2 puffs into the lungs 2 (two) times daily. 10/26/15   Burgess Estelle, MD  omalizumab Arvid Right) 150 MG injection Inject 150 mg into the skin every 14 (fourteen) days.    Historical Provider, MD  predniSONE (DELTASONE) 20 MG tablet Take 2 tablets (40 mg total) by mouth daily with breakfast. On July 2, take 1.5 tablets daily, then on July 7 take 1 tab daily until July 11 10/26/15   Burgess Estelle, MD  predniSONE (STERAPRED UNI-PAK 21 TAB) 10 MG (21) TBPK tablet Take 1 tablet (10 mg total) by mouth daily. Starting July 12 11/08/15   Burgess Estelle, MD  sertraline (ZOLOFT) 50 MG tablet Take 1 tablet (50 mg total) by mouth daily. 08/30/15   [name], MD  theophylline (THEODUR) 300 MG 12 hr tablet Take 1 tablet (300 mg total) by mouth daily. 10/26/15   Burgess Estelle, MD   BP 192/103 mmHg  Pulse 104  Temp(Src) 98.7 F (37.1 C) (Oral)  Resp 25  Ht 5\' 5"  (1.651 m)  Wt 136.079 kg  BMI 49.92 kg/m2  SpO2 100%  LMP 09/05/2015 Physical Exam  Constitutional: She is oriented to person, place, and time. She appears well-developed and well-nourished. No distress.  HENT:  Head: Normocephalic and atraumatic.  Eyes: Conjunctivae are normal.  Cardiovascular: Normal rate and normal heart sounds.   No murmur heard. Pulmonary/Chest: Effort normal and breath sounds normal.  Abdominal: Soft. There is no tenderness.  Musculoskeletal:  She exhibits no edema.  Neurological: She is alert and oriented to person, place, and time.  Skin: Skin is warm. She is not diaphoretic.  Psychiatric: She has a normal mood and affect. Her behavior is normal.  Nursing note and vitals reviewed.   ED Course  Procedures (including critical care time) Labs Review Labs Reviewed  CBC - Abnormal; Notable for the following:    WBC 19.9 (*)    Hemoglobin 11.9 (*)    RDW 16.3 (*)    All other components within normal limits  COMPREHENSIVE METABOLIC PANEL - Abnormal; Notable for the following:    Potassium 2.9 (*)    Glucose, Bld 130 (*)    Calcium 8.7 (*)    Total Protein 5.7 (*)    Albumin 3.0 (*)    All other components within normal limits    Imaging Review Dg Chest 2 View  11/10/2015  CLINICAL DATA:  Shortness of breath and wheezing  for 2 days, asthma, COPD, former smoker EXAM: CHEST  2 VIEW COMPARISON:  10/24/2015 FINDINGS: Normal heart size, mediastinal contours, and pulmonary vascularity. Mild chronic bronchitic changes. Lungs otherwise clear. No pleural effusion or pneumothorax. No acute bony abnormalities. IMPRESSION: Chronic bronchitic changes without infiltrate. Electronically Signed   By: Lavonia Dana M.D.   On: 11/10/2015 11:15   I have personally reviewed and evaluated these images and lab results as part of my medical decision-making.   EKG Interpretation None      MDM   Final diagnoses:  None    Patient is a 45 year old female with past history significant for multiple asthma exacerbations requiring hospitalization, most recently 2.5 weeks ago. She presents today with gradual worsening shortness of breath over the last 18 hours. She tried 6-7 doses of her albuterol metered-dose inhaler at home this morning but did not have any improvement in her shortness of breath. On arrival she was given back-to-back DuoNebs with mild improvement in her symptoms. She is given 125 mg of Solu-Medrol and 2 mg of magnesium for continued  relief.  She states she has continued shortness of breath despite the 2 nebs and required a third DuoNeb.Patient was admitted to the internal medicine service for further care of her asthma exacerbation. While in the emergency department her vitals remained within normal limits.  Allie Bossier, MD 11/10/15 [phone]

## 2015-11-10 NOTE — ED Notes (Signed)
Patients oxygen is 88% while sleeping, oxygen came up to 96% while awake and talking, placed on 2L [place]

## 2015-11-10 NOTE — ED Notes (Signed)
Pt arrives via POV from home with asthma exacerbation pt states began 2 days ago. Pt reports recent admission for asthma 2.5 weeks ago. Pt with audible wheezing, moderate distress noted. States off and on subjective fever.

## 2015-11-11 LAB — DIFFERENTIAL
BASOS ABS: 0 10*3/uL (ref 0.0–0.1)
BASOS PCT: 0 %
Band Neutrophils: 0 %
Blasts: 0 %
EOS PCT: 0 %
Eosinophils Absolute: 0 10*3/uL (ref 0.0–0.7)
Lymphocytes Relative: 8 %
Lymphs Abs: 1.8 10*3/uL (ref 0.7–4.0)
MONOS PCT: 5 %
Metamyelocytes Relative: 0 %
Monocytes Absolute: 1.1 10*3/uL — ABNORMAL HIGH (ref 0.1–1.0)
Myelocytes: 0 %
NEUTROS ABS: 19.1 10*3/uL — AB (ref 1.7–7.7)
NEUTROS PCT: 87 %
Other: 0 %
Promyelocytes Absolute: 0 %
nRBC: 0 /100 WBC

## 2015-11-11 LAB — CBC
HCT: 37.4 % (ref 36.0–46.0)
Hemoglobin: 11.9 g/dL — ABNORMAL LOW (ref 12.0–15.0)
MCH: 28.4 pg (ref 26.0–34.0)
MCHC: 31.8 g/dL (ref 30.0–36.0)
MCV: 89.3 fL (ref 78.0–100.0)
PLATELETS: 394 10*3/uL (ref 150–400)
RBC: 4.19 MIL/uL (ref 3.87–5.11)
RDW: 16.1 % — AB (ref 11.5–15.5)
WBC: 22.2 10*3/uL — ABNORMAL HIGH (ref 4.0–10.5)

## 2015-11-11 LAB — BASIC METABOLIC PANEL
Anion gap: 7 (ref 5–15)
BUN: 8 mg/dL (ref 6–20)
CALCIUM: 8.8 mg/dL — AB (ref 8.9–10.3)
CO2: 27 mmol/L (ref 22–32)
CREATININE: 0.51 mg/dL (ref 0.44–1.00)
Chloride: 102 mmol/L (ref 101–111)
GFR calc Af Amer: >60 mL/min (ref >60)
GFR calc non Af Amer: >60 mL/min (ref >60)
GLUCOSE: 128 mg/dL — AB (ref 65–99)
Potassium: 4.1 mmol/L (ref 3.5–5.1)
Sodium: 136 mmol/L (ref 135–145)

## 2015-11-11 MED ORDER — METHYLPREDNISOLONE SODIUM SUCC 125 MG IJ SOLR
INTRAMUSCULAR | Status: AC
  Filled 2015-11-11: qty 2

## 2015-11-11 MED ORDER — LIDOCAINE 5 % EX PTCH
1.0000 | MEDICATED_PATCH | CUTANEOUS | Status: DC
  Administered 2015-11-11 – 2015-11-12 (×2): 1 via TRANSDERMAL
  Filled 2015-11-11 (×2): qty 1

## 2015-11-11 MED ORDER — PANTOPRAZOLE SODIUM 40 MG PO TBEC
40.0000 mg | DELAYED_RELEASE_TABLET | Freq: Two times a day (BID) | ORAL | Status: DC
  Administered 2015-11-11 – 2015-11-12 (×3): 40 mg via ORAL
  Filled 2015-11-11 (×3): qty 1

## 2015-11-11 MED ORDER — NICOTINE 14 MG/24HR TD PT24
14.0000 mg | MEDICATED_PATCH | Freq: Every day | TRANSDERMAL | Status: DC
  Administered 2015-11-12: 14 mg via TRANSDERMAL
  Filled 2015-11-11: qty 1

## 2015-11-11 MED ORDER — PANTOPRAZOLE SODIUM 40 MG PO TBEC: 40.0000 mg | DELAYED_RELEASE_TABLET | Freq: Two times a day (BID) | ORAL | Status: DC

## 2015-11-11 MED ORDER — METHYLPREDNISOLONE SODIUM SUCC 125 MG IJ SOLR
60.0000 mg | Freq: Once | INTRAMUSCULAR | Status: AC
  Administered 2015-11-11: 60 mg via INTRAVENOUS
  Filled 2015-11-11: qty 2

## 2015-11-11 MED ORDER — PREDNISONE 10 MG PO TABS: ORAL_TABLET | ORAL | Status: DC

## 2015-11-11 NOTE — Progress Notes (Signed)
Subjective: Continues to complain of wheezing and SOB, improved from yesterday. Chest tightness relieved with breathing treatments.   Objective: Vital signs in last 24 hours: Filed Vitals:   11/10/15 2159 11/11/15 0519 11/11/15 0544 11/11/15 0822  BP: 151/75  161/89   Pulse: 73  72   Temp: 98 F (36.7 C)  97.6 F (36.4 C)   TempSrc:   Oral   Resp: 18  22   Height:      Weight:      SpO2: 100% 99% 100% 98%   Labs:  Ref. Range 11/11/2015 07:26  Sodium Latest Ref Range: 135-145 mmol/L 136  Potassium Latest Ref Range: 3.5-5.1 mmol/L 4.1  Chloride Latest Ref Range: 101-111 mmol/L 102  CO2 Latest Ref Range: 22-32 mmol/L 27  BUN Latest Ref Range: 6-20 mg/dL 8  Creatinine Latest Ref Range: 0.44-1.00 mg/dL 0.51  Calcium Latest Ref Range: 8.9-10.3 mg/dL 8.8 (L)  EGFR (Non-African Amer.) Latest Ref Range: >60 mL/min >60  EGFR (African American) Latest Ref Range: >60 mL/min >60  Glucose Latest Ref Range: 65-99 mg/dL 128 (H)  Anion gap Latest Ref Range: 5-15  7  WBC Latest Ref Range: 4.0-10.5 K/uL 22.2 (H)  RBC Latest Ref Range: 3.87-5.11 MIL/uL 4.19  Hemoglobin Latest Ref Range: 12.0-15.0 g/dL 11.9 (L)  HCT Latest Ref Range: 36.0-46.0 % 37.4  MCV Latest Ref Range: 78.0-100.0 fL 89.3  MCH Latest Ref Range: 26.0-34.0 pg 28.4  MCHC Latest Ref Range: 30.0-36.0 g/dL 31.8  RDW Latest Ref Range: 11.5-15.5 % 16.1 (H)  Platelets Latest Ref Range: 150-400 K/uL 394    Ref. Range 11/11/2015 08:52  Neutrophils Latest Units: % 87  Lymphocytes Latest Units: % 8  Monocytes Relative Latest Units: % 5  Eosinophil Latest Units: % 0  Basophil Latest Units: % 0  NEUT# Latest Ref Range: 1.7-7.7 K/uL 19.1 (H)  Lymphocyte # Latest Ref Range: 0.7-4.0 K/uL 1.8  Monocyte # Latest Ref Range: 0.1-1.0 K/uL 1.1 (H)  Eosinophils Absolute Latest Ref Range: 0.0-0.7 K/uL 0.0  Basophils Absolute Latest Ref Range: 0.0-0.1 K/uL 0.0  WBC Morphology Unknown MILD LEFT SHIFT (...  Myelocytes Latest Units: % 0    Metamyelocytes Relative Latest Units: % 0  Promyelocytes Absolute Latest Units: % 0  Blasts Latest Units: % 0  nRBC Latest Ref Range: 0 /100 WBC 0  Band Neutrophils Latest Units: % 0  Other Latest Units: % 0   Physical Exam Constitutional: NAD, speaking full sentences  HEENT: normocephalic, atraumatic. Neck supple. Anicteric sclera.  Cardiovascular: RRR. No murmurs, rubs, or gallops.  Respiratory: Mild expiratory wheezing bilaterally improved from yesterday, no crackles or rhonchi.  Gastrointestinal: Soft, non tender, non distended. +BS Extremities: No LE edema, distal pulses intact.  Integumentary: No rashes or lesions  Neurological: Alert and oriented x3. CN II- XII grossly intact Psych: Mood and affect normal   Assessment/Plan:   April [name] is a 45 yo F with a pmhx severe persistent asthma, on budesonide, theophylline, and Xolair injections who presents with progressively worsening SOB and wheezing admitted for acute on chronic asthma exacerbation.  Acute on Chronic Asthma Exacerbation: History of severe, persistent asthma on budesonide, theophylline, and Xolair injections. Recent hospitalization for similar exacerbation. Discharged on 6/29 with prednisone taper which she finished the day prior to admission. Reports worsening symptoms of SOB and wheezing at home over the past few days and increased frequency of rescue inhaler use without relief.  - Proventil q2h PRN, Duonebs q6hr PRN - IV solumedrol today - Prednisone  $'60mg'z$  daily for 5 days starting tomorrow   GERD: Patient reports worsening reflux symptoms with an episode of emesis prior to admission. Possibly contributing to asthma exacerbation and worsening symptoms over the past 6 months.  - Increase home pantoprazole '40mg'$  daily --> BID  HTN: - Continue HCTZ 25 daily   Allergic Rhinitis: - Continue home Claritin   Depression: - Continue home Zoloft 50 daily   Full Code  Dispo: Anticipated discharge in  approximately 1-2 day(s).     Velna Ochs, MD 11/11/2015, 9:41 AM Pager: [phone]

## 2015-11-11 NOTE — Progress Notes (Signed)
Patient's room and bathroom smelled of cigarette smoke. Patient denies having cigarettes or lighter in possession. Patient denies smoking cigarettes in room. Allowed RN to check clothing for cigarettes or lighter. RN found lighter in jean pocket located in bathroom. RN explained policy against smoking in facility. MD paged. Will continue to monitor.

## 2015-11-11 NOTE — Discharge Summary (Signed)
Name: April [name] MRN: [id number] DOB: [birth year] 45 y.o. PCP: Deneise Lever, MD  Date of Admission: 11/10/2015 11:01 AM Date of Discharge: 11/13/2015 Attending Physician: No att. providers found  Discharge Diagnosis: 1. Acute on Chronic Severe Persistent Asthma  Active Problems:   Asthma exacerbation   Discharge Medications:   Medication List    STOP taking these medications        esomeprazole 40 MG capsule  Commonly known as:  NEXIUM     Liraglutide -Weight Management 18 MG/3ML Sopn     zolpidem 10 MG tablet  Commonly known as:  AMBIEN      TAKE these medications        acetaminophen 500 MG tablet  Commonly known as:  TYLENOL  Take 2 tablets (1,000 mg total) by mouth every 6 (six) hours as needed.     ADVIL PM 200-25 MG Caps  Generic drug:  Ibuprofen-Diphenhydramine HCl  Take 2-4 tablets by mouth at bedtime as needed (pain).     albuterol (2.5 MG/3ML) 0.083% nebulizer solution  Commonly known as:  PROVENTIL  Take 3 mLs (2.5 mg total) by nebulization every 6 (six) hours as needed for wheezing or shortness of breath.     albuterol 108 (90 Base) MCG/ACT inhaler  Commonly known as:  PROVENTIL HFA;VENTOLIN HFA  Inhale 1-2 puffs into the lungs every 4 (four) hours as needed for wheezing or shortness of breath.     FLONASE 50 MCG/ACT nasal spray  Generic drug:  fluticasone  Place 2 sprays into both nostrils daily.     hydrochlorothiazide 25 MG tablet  Commonly known as:  HYDRODIURIL  Take 1 tablet (25 mg total) by mouth daily.     ipratropium 0.02 % nebulizer solution  Commonly known as:  ATROVENT  Take 2.5 mLs (0.5 mg total) by nebulization 4 (four) times daily as needed for wheezing or shortness of breath.     loratadine 10 MG tablet  Commonly known as:  CLARITIN  Take 1 tablet (10 mg total) by mouth daily.     mometasone-formoterol 200-5 MCG/ACT Aero  Commonly known as:  DULERA  Inhale 2 puffs into the lungs 2 (two) times daily.     omalizumab 150  MG injection  Commonly known as:  XOLAIR  Inject 150 mg into the skin every 14 (fourteen) days.     pantoprazole 40 MG tablet  Commonly known as:  PROTONIX  Take 1 tablet (40 mg total) by mouth 2 (two) times daily.     predniSONE 10 MG tablet  Commonly known as:  DELTASONE  Take daily: 60 mg (6 tabs) for 7 days, 30 mg (3 tabs) for 3 days, 10 mg (1 tab) for 3 days, and 5 mg (1/2 tab) for 3 days, then stop.     sertraline 50 MG tablet  Commonly known as:  ZOLOFT  Take 1 tablet (50 mg total) by mouth daily.     STOOL SOFTENER 100 MG capsule  Generic drug:  docusate sodium  Take 100 mg by mouth 2 (two) times daily as needed.     theophylline 300 MG 12 hr tablet  Commonly known as:  THEODUR  Take 1 tablet (300 mg total) by mouth daily.        Disposition and follow-up:   April [name] was discharged from [facility] in Stable condition.  At the hospital follow up visit please address:  1.  Patient sent home on budesonide, theophylline, and prednisone  taper. Please ensure she completes the taper. Scheduled to follow up with Dr. Annamaria Boots for Xolair injections, missed her last appointment due to transportation issues. Encourage smoking cessation.   2.  Labs / imaging needed at time of follow-up: None  3.  Pending labs/ test needing follow-up: None  Follow-up Appointments: Follow-up Information    Follow up with Deneise Lever, MD On 11/13/2015.   Specialty:  Pulmonary Disease   Why:  Please follow up with Dr. Janee [name] office on Monday, July 17th at [facility] information:   [place] [id number] [phone]       Hospital Course by problem list: Active Problems:   Asthma exacerbation   1. Asthma Exacerbation: April [name] is a 45 yo F with a pmhx of severe persistent asthma, on budesonide, theophylline, and Xolair injections who presented with progressively worsening SOB and wheezing over the past 4 days. She reported increased use of her  home inhalers, Dulera 3-4 times a day and rescue inhaler up to 10 times a day with no relief. She was recently discharged for a similar exacerbation two weeks prior and was sent home on a prednisone taper that she finished the day before presentation. Overall, she reported worsening of her asthma symptoms over the past 6 months. Since December, she has been to the ER almost once a month for symptoms management. On arrival, she was tachypneic 25, tachycardic 104, hypertensive 192/103, and sating 100% on RA. CXR showed chronic bronchitic changes without infiltrate. EKG was sinus rhythm with no ischemic changes. She received 3 duoneb treatments, 125mg  solumedrol, and 2mg  magnesium with some relief of symptoms. She was started on Proventil q2hr prn, duonebs q6hr prn, and prednisone 60mg . Vitals normalized the following day and symptoms improved with breathing treatments. Patient was discharged on a home prednisone taper and instructed to follow up with her scheduled pulmonology appointment.   2. GERD: Patient also endorsed recent worsening of her acid reflux symptoms with an episode of emesis prior to admission. It was discussed that this could possibly be contributing to her asthma exacerbation and overall worsening of her symptoms over the past 6 months. Home pantoprazole 40mg  daily was increased to BID.  Discharge Vitals:   BP 156/74 mmHg  Pulse 89  Temp(Src) 98.3 F (36.8 C) (Oral)  Resp 17  Ht 5\' 5"  (1.651 m)  Wt 337 lb 8.4 oz (153.1 kg)  BMI 56.17 kg/m2  SpO2 99%  LMP 09/05/2015  Pertinent Labs, Studies, and Procedures:   11/10/15 CXR 2 View  IMPRESSION: Chronic bronchitic changes without infiltrate.  Discharge Instructions: Discharge Instructions    Call MD for:  difficulty breathing, headache or visual disturbances    Complete by:  As directed      Call MD for:  temperature >100.4    Complete by:  As directed      Diet - low sodium heart healthy    Complete by:  As directed       Discharge instructions    Complete by:  As directed   Please take prednisone daily as prescribed: 60 mg (6 tablets) for 7 days, 30 mg (3 tablets) for 3 days, 10 mg (1 tablet) for 3 days, and 5mg  (1/2 tablet) for 3 days and then stop.     Increase activity slowly    Complete by:  As directed            Signed: Velna Ochs, MD 11/13/2015, 11:35 AM   Pager: [phone]

## 2015-11-11 NOTE — Progress Notes (Signed)
SATURATION QUALIFICATIONS: (This note is used to comply with regulatory documentation for home oxygen)  Patient Saturations on Room Air at Rest = 98%  Patient Saturations on Room Air while Ambulating = 95%   

## 2015-11-12 MED ORDER — PREDNISONE 50 MG PO TABS
60.0000 mg | ORAL_TABLET | Freq: Every day | ORAL | Status: DC
  Administered 2015-11-12: 60 mg via ORAL
  Filled 2015-11-12: qty 1

## 2015-11-12 MED ORDER — ALBUTEROL SULFATE HFA 108 (90 BASE) MCG/ACT IN AERS: 1.0000 | INHALATION_SPRAY | RESPIRATORY_TRACT | Status: DC | PRN

## 2015-11-12 MED ORDER — PANTOPRAZOLE SODIUM 40 MG PO TBEC: 40.0000 mg | DELAYED_RELEASE_TABLET | Freq: Two times a day (BID) | ORAL | Status: DC

## 2015-11-12 MED ORDER — MOMETASONE FURO-FORMOTEROL FUM 200-5 MCG/ACT IN AERO
2.0000 | INHALATION_SPRAY | Freq: Two times a day (BID) | RESPIRATORY_TRACT | Status: DC
  Filled 2015-11-12: qty 8.8

## 2015-11-12 MED ORDER — ALBUTEROL SULFATE (2.5 MG/3ML) 0.083% IN NEBU: 2.5000 mg | INHALATION_SOLUTION | RESPIRATORY_TRACT | Status: DC | PRN

## 2015-11-12 MED ORDER — MOMETASONE FURO-FORMOTEROL FUM 200-5 MCG/ACT IN AERO: 2.0000 | INHALATION_SPRAY | Freq: Two times a day (BID) | RESPIRATORY_TRACT | Status: DC

## 2015-11-12 NOTE — Progress Notes (Signed)
   Subjective: She feels better this morning. She is frustrated that she may be dismissed from her workplace.  Objective: Vital signs in last 24 hours: Filed Vitals:   11/12/15 0212 11/12/15 0546 11/12/15 0602 11/12/15 0737  BP:  156/74    Pulse:  89    Temp:  98.3 F (36.8 C)    TempSrc:      Resp:  17    Height:      Weight:      SpO2: 100% 97% 97% 99%   Physical Exam General Apperance: NAD HEENT: Normocephalic, atraumatic, anicteric sclera Neck: Supple, trachea midline Lungs: Clear to auscultation bilaterally. No wheezes, rhonchi or rales. Breathing comfortably Heart: Regular rate and rhythm, no murmur/rub/gallop Abdomen: Soft, nontender, nondistended, no rebound/guarding Extremities: Warm and well perfused, no edema Skin: No rashes or lesions Neurologic: Alert and interactive. No gross deficits.  Assessment/Plan: 45 year old woman with severe persistent asthma presenting with dyspnea and wheezing.  Acute on Chronic Severe Persistent Asthma: Probably due to medication noncompliance. GERD can also be a contributing factor. -Albuterol q2hr prn -Duoneb q6hr -Brovana and Pulmicort neb BID changed to home Dulera -Theophylline daily -Prednisone 60mg  daily  GERD: Continue Protonix 40mg  BID HTN: Continue home HCTZ Allergic rhinitis: Continue home Claritin Depression: Continue home Zoloft  FEN: General VTE ppx: Lovenox CODE: FULL  Dispo: Likely home today     [name], MD 11/12/2015, 8:31 AM Pager: [phone]

## 2015-11-12 NOTE — Progress Notes (Signed)
Nsg Discharge Note  Admit Date:  11/10/2015 Discharge date: 11/12/2015   [name] to be D/C'd Home per MD order.  AVS completed.  Copy for chart, and copy for patient signed, and dated. Patient/caregiver able to verbalize understanding.  Discharge Medication:   Medication List    STOP taking these medications        esomeprazole 40 MG capsule  Commonly known as:  NEXIUM     Liraglutide -Weight Management 18 MG/3ML Sopn     zolpidem 10 MG tablet  Commonly known as:  AMBIEN      TAKE these medications        acetaminophen 500 MG tablet  Commonly known as:  TYLENOL  Take 2 tablets (1,000 mg total) by mouth every 6 (six) hours as needed.     ADVIL PM 200-25 MG Caps  Generic drug:  Ibuprofen-Diphenhydramine HCl  Take 2-4 tablets by mouth at bedtime as needed (pain).     albuterol (2.5 MG/3ML) 0.083% nebulizer solution  Commonly known as:  PROVENTIL  Take 3 mLs (2.5 mg total) by nebulization every 6 (six) hours as needed for wheezing or shortness of breath.     albuterol 108 (90 Base) MCG/ACT inhaler  Commonly known as:  PROVENTIL HFA;VENTOLIN HFA  Inhale 1-2 puffs into the lungs every 4 (four) hours as needed for wheezing or shortness of breath.     FLONASE 50 MCG/ACT nasal spray  Generic drug:  fluticasone  Place 2 sprays into both nostrils daily.     hydrochlorothiazide 25 MG tablet  Commonly known as:  HYDRODIURIL  Take 1 tablet (25 mg total) by mouth daily.     ipratropium 0.02 % nebulizer solution  Commonly known as:  ATROVENT  Take 2.5 mLs (0.5 mg total) by nebulization 4 (four) times daily as needed for wheezing or shortness of breath.     loratadine 10 MG tablet  Commonly known as:  CLARITIN  Take 1 tablet (10 mg total) by mouth daily.     mometasone-formoterol 200-5 MCG/ACT Aero  Commonly known as:  DULERA  Inhale 2 puffs into the lungs 2 (two) times daily.     omalizumab 150 MG injection  Commonly known as:  XOLAIR  Inject 150 mg into the skin  every 14 (fourteen) days.     pantoprazole 40 MG tablet  Commonly known as:  PROTONIX  Take 1 tablet (40 mg total) by mouth 2 (two) times daily.     predniSONE 10 MG tablet  Commonly known as:  DELTASONE  Take daily: 60 mg (6 tabs) for 7 days, 30 mg (3 tabs) for 3 days, 10 mg (1 tab) for 3 days, and 5 mg (1/2 tab) for 3 days, then stop.     sertraline 50 MG tablet  Commonly known as:  ZOLOFT  Take 1 tablet (50 mg total) by mouth daily.     STOOL SOFTENER 100 MG capsule  Generic drug:  docusate sodium  Take 100 mg by mouth 2 (two) times daily as needed.     theophylline 300 MG 12 hr tablet  Commonly known as:  THEODUR  Take 1 tablet (300 mg total) by mouth daily.        Discharge Assessment: Filed Vitals:   11/11/15 2132 11/12/15 0546  BP: 149/73 156/74  Pulse: 97 89  Temp: 98 F (36.7 C) 98.3 F (36.8 C)  Resp: 18 17   Skin clean, dry and intact without evidence of skin break down, no evidence  of skin tears noted. IV catheter discontinued intact. Site without signs and symptoms of complications - no redness or edema noted at insertion site, patient denies c/o pain - only slight tenderness at site.  Dressing with slight pressure applied.  D/c Instructions-Education: Discharge instructions given to patient/family with verbalized understanding. D/c education completed with patient/family including follow up instructions, medication list, d/c activities limitations if indicated, with other d/c instructions as indicated by MD - patient able to verbalize understanding, all questions fully answered. Patient instructed to return to ED, call 911, or call MD for any changes in condition.  Patient escorted via [place], and D/C home via private auto.  Dayle Points, RN 11/12/2015 1:39 PM

## 2015-11-13 ENCOUNTER — Inpatient Hospital Stay: Payer: Self-pay | Admitting: Pulmonary Disease

## 2015-11-13 LAB — PATHOLOGIST SMEAR REVIEW

## 2015-11-16 IMAGING — DX DG SHOULDER 2+V*L*
3 series · 4 of 4 positions shown · non-contrast
Comparison: 08/29/2008

CLINICAL DATA: Left shoulder pain for the last 2 weeks.

EXAM:
LEFT SHOULDER - 2+ VIEW

[shoulder grashey]
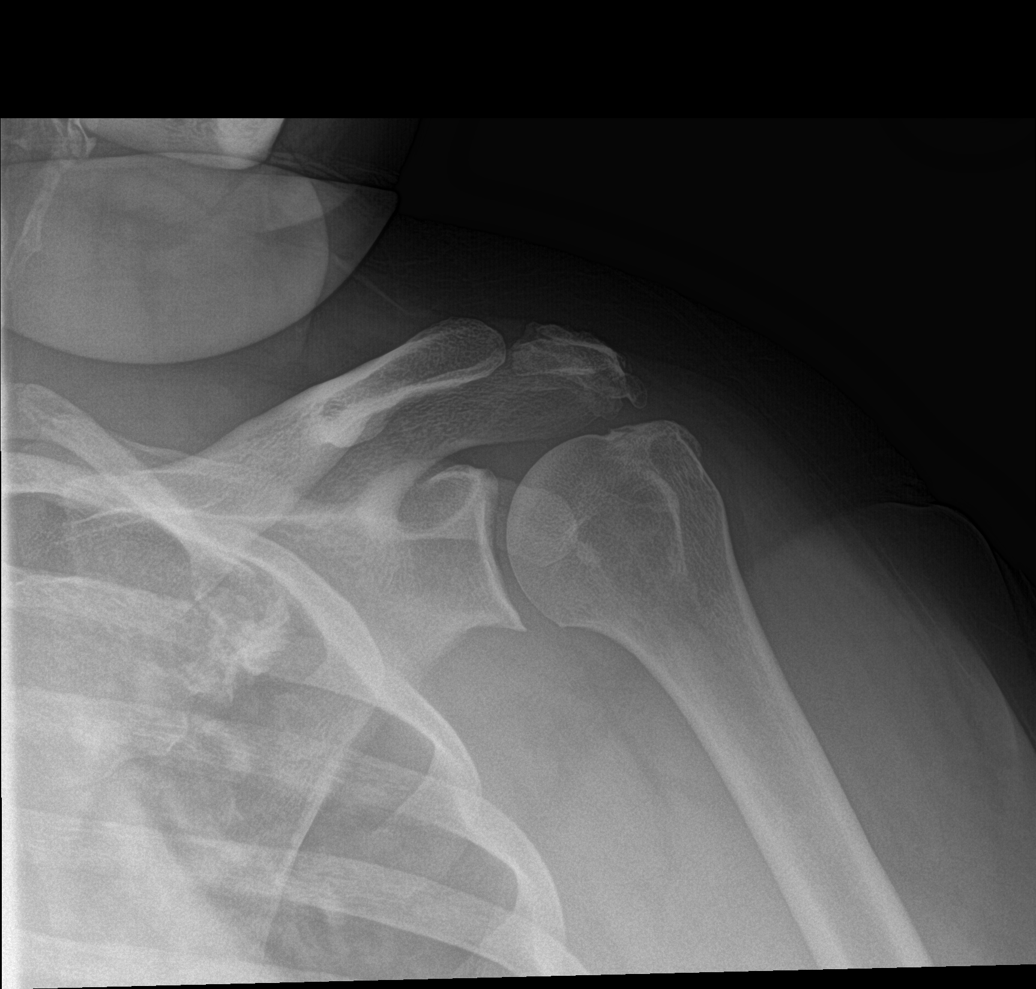

[Series 2: shoulder y view · 0.14mm/px · 2 of 2 slices shown]
[im 1/2]
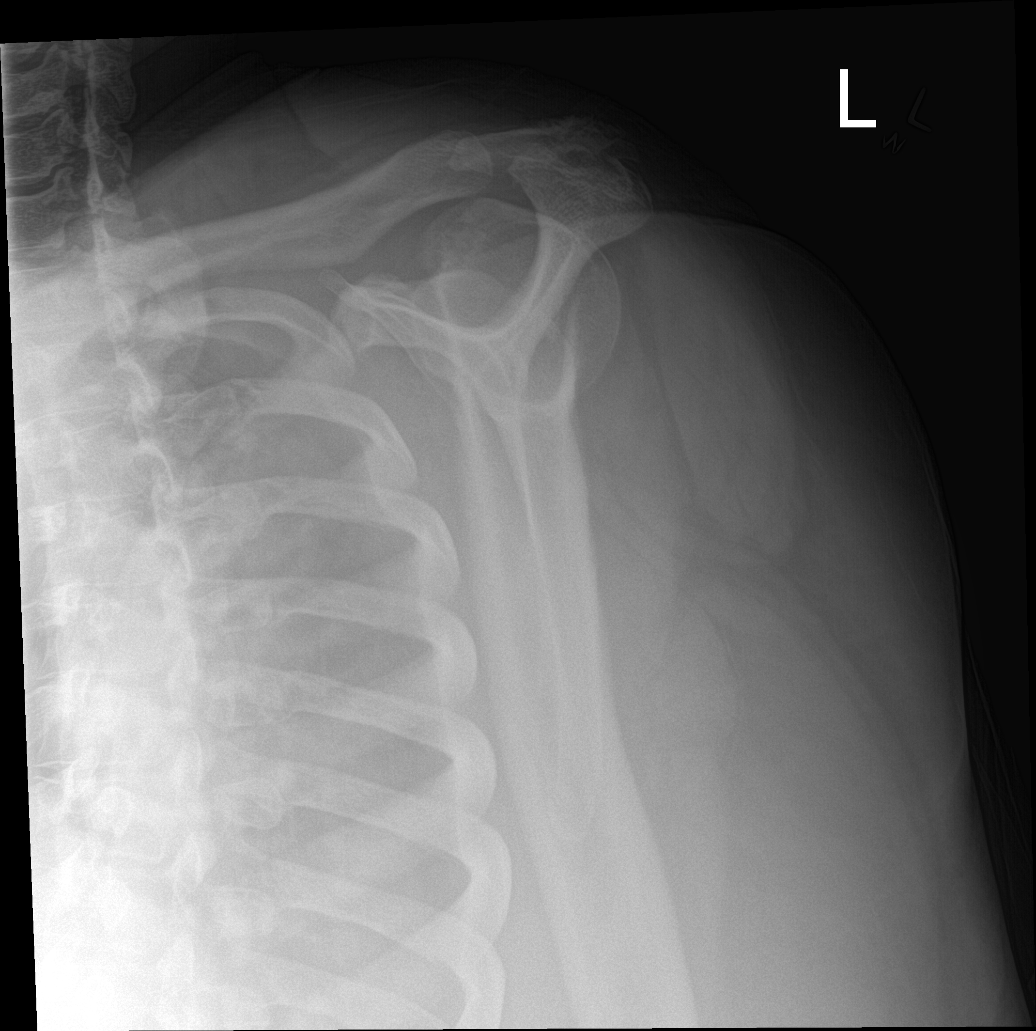
[im 2/2]
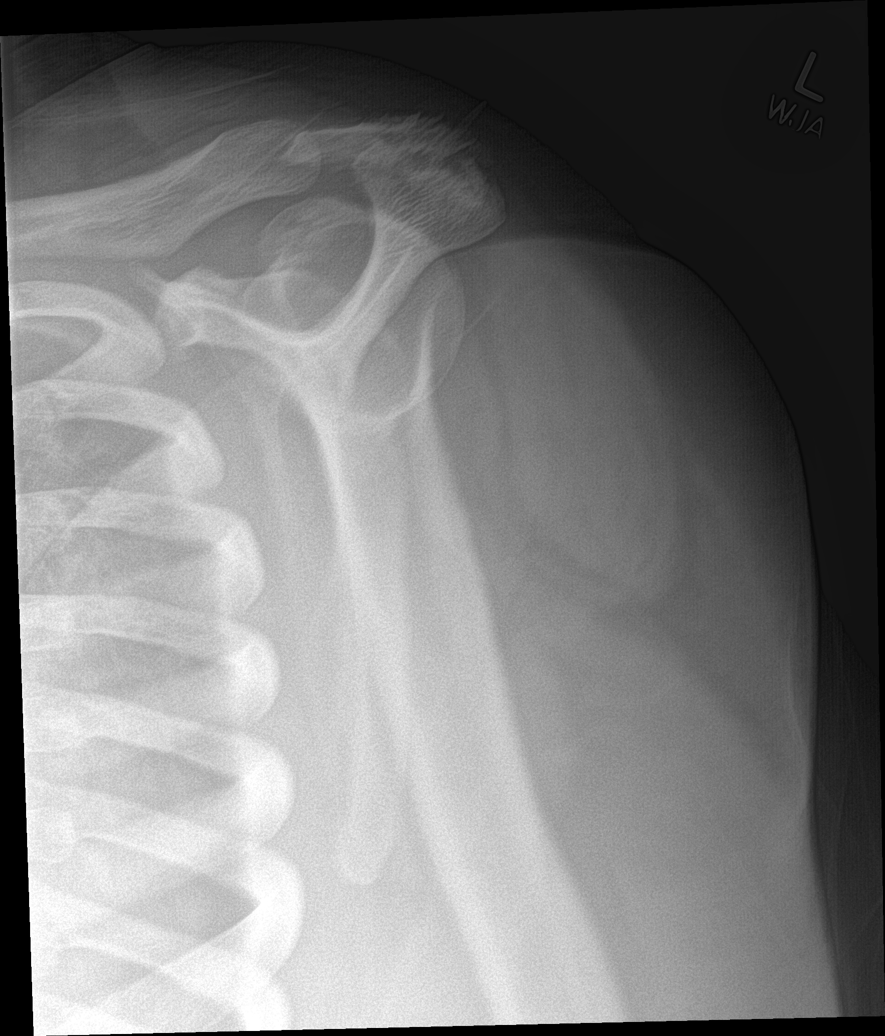

[shoulder axillary]
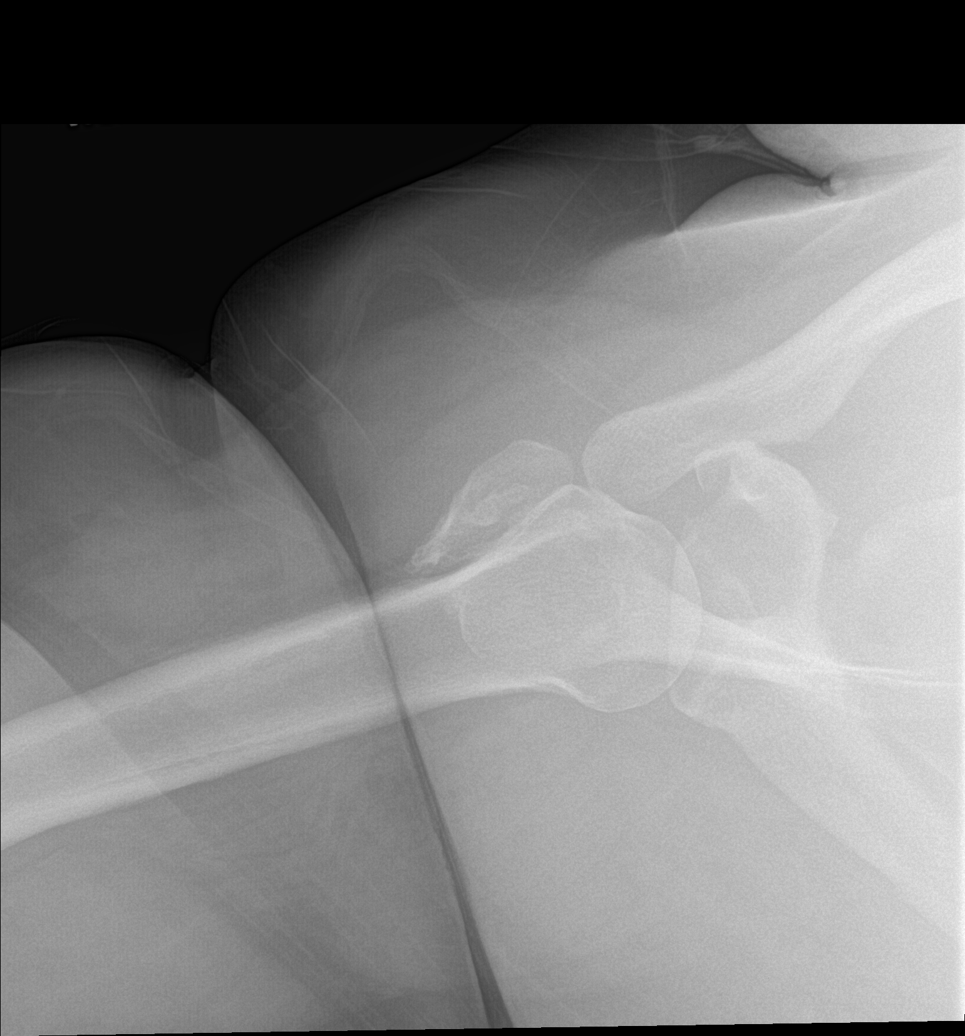

[4 of 4 positions shown; findings below may reference images not displayed]

FINDINGS: Os acromiale. The acromion is also downsloping and there are
inferior spurs narrowing the subacromial space. Mild marginal
spurring about the glenohumeral joint without narrowing. No evidence
of acute fracture, dislocation. No soft tissue mineralization.
IMPRESSION: 1. No acute findings.
2. Os acromiale with spurs narrowing the subacromial space.

## 2015-11-16 IMAGING — DX DG CHEST 2V
2 series · 2 of 2 positions shown · non-contrast
Comparison: 09/15/2014

CLINICAL DATA: Left shoulder pain.

EXAM:
CHEST  2 VIEW

[chest pa]
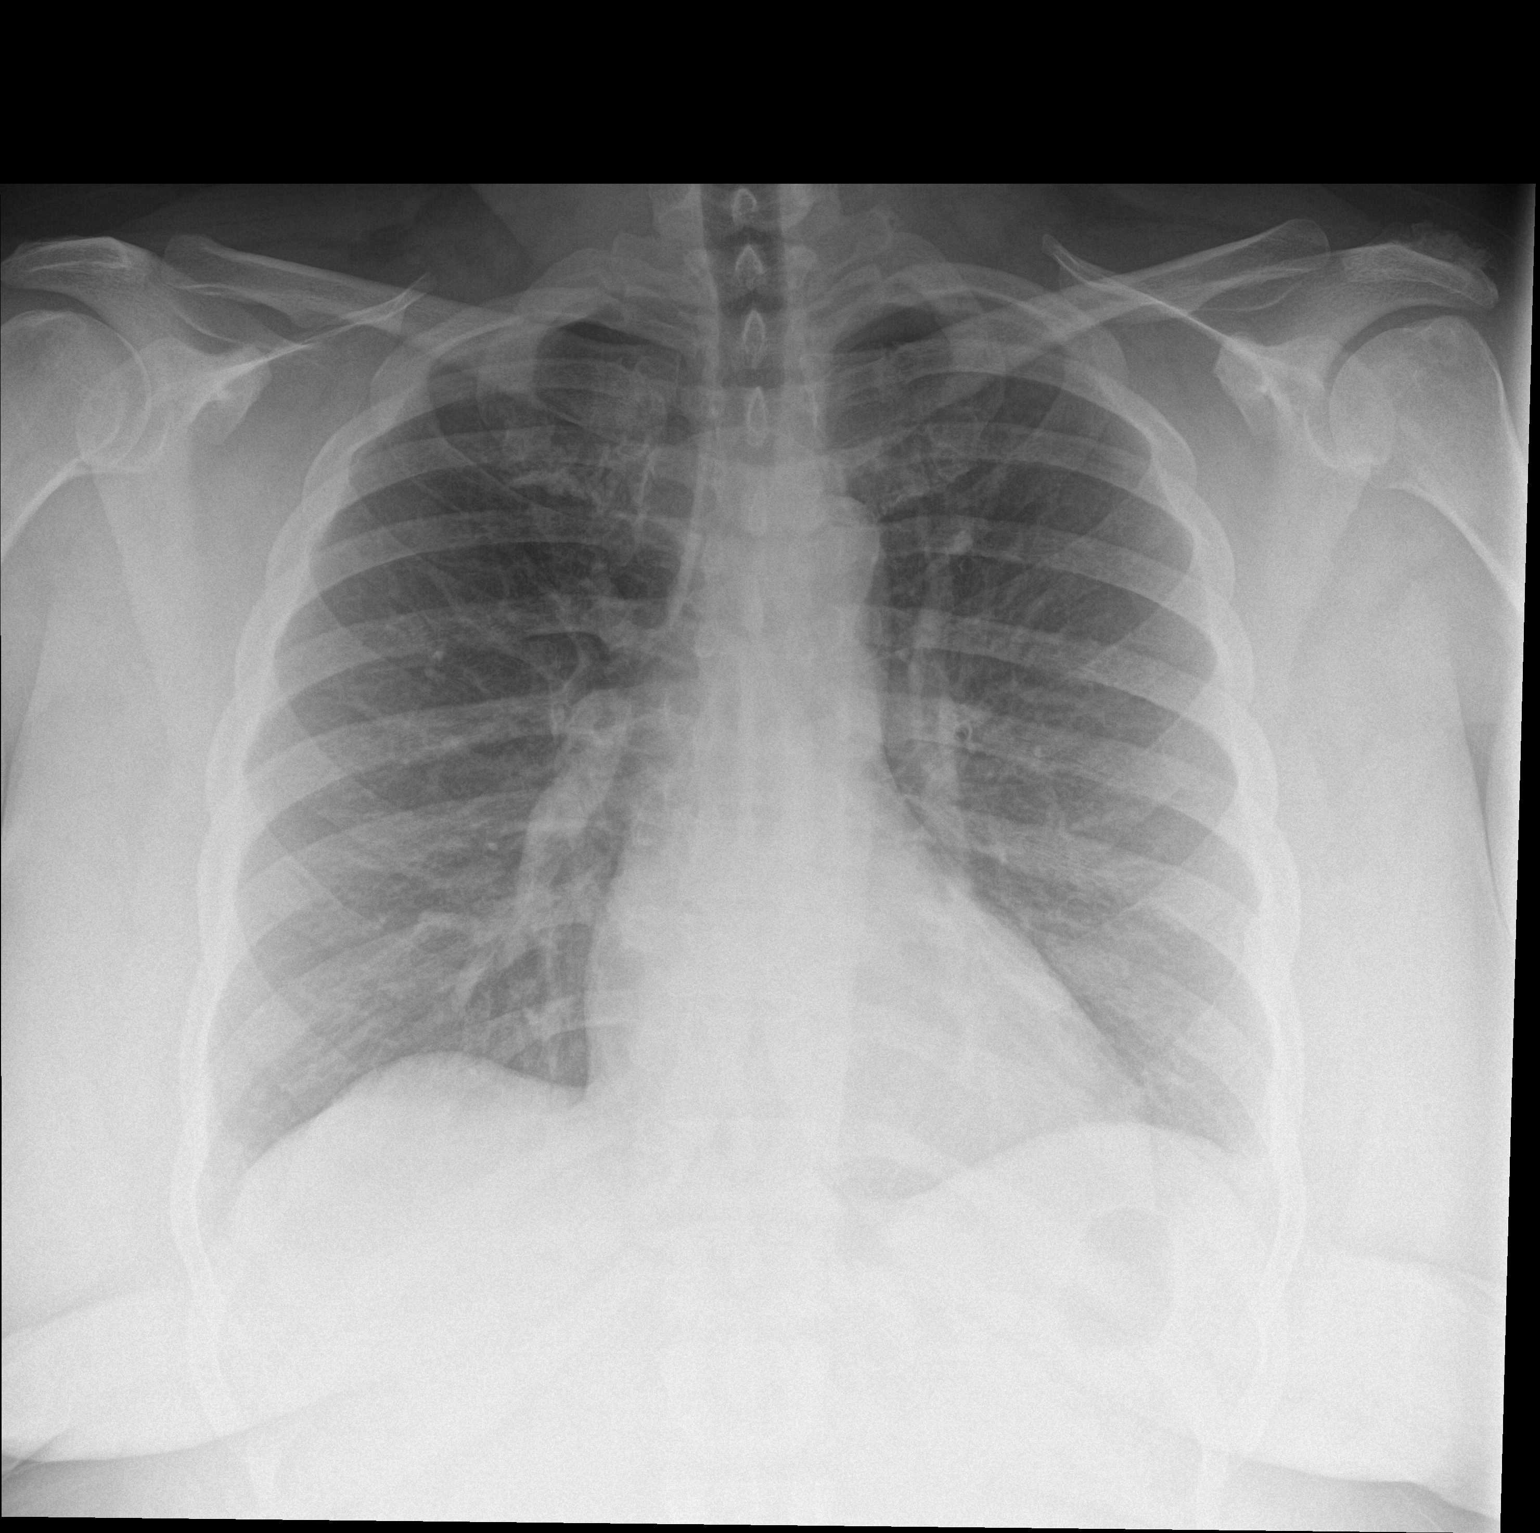

[chest lat]
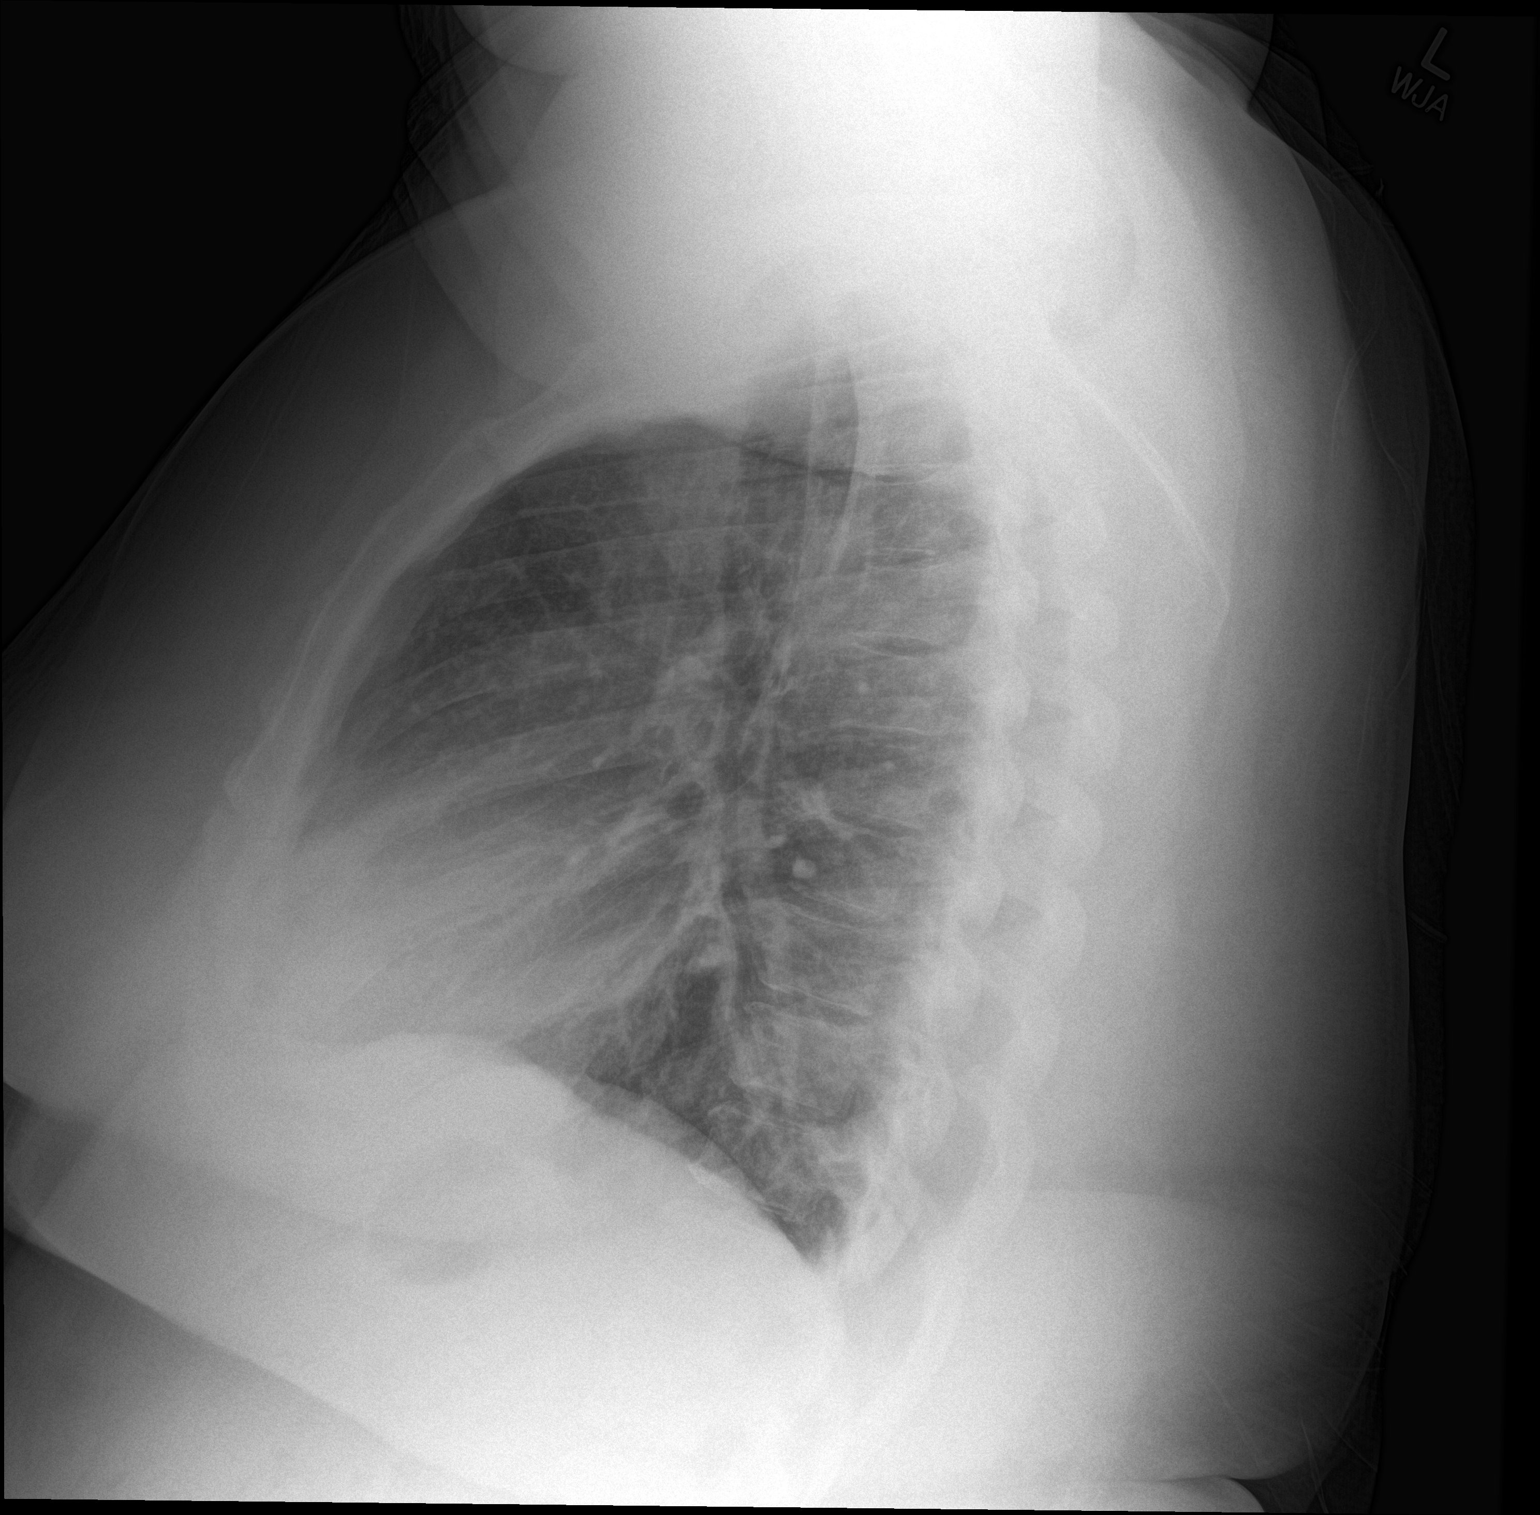

[2 of 2 positions shown; findings below may reference images not displayed]

FINDINGS: Normal heart size and mediastinal contours. Chronic mild coarsening
of lung markings. No acute infiltrate or edema. No effusion or
pneumothorax. No acute osseous findings.
IMPRESSION: Chronic bronchitic markings.  No acute findings.

## 2015-11-19 IMAGING — DX DG CHEST 2V
2 series · 2 of 2 positions shown · non-contrast
Comparison: 10/31/2014

CLINICAL DATA: Asthma.  Shortness of breath

EXAM:
CHEST  2 VIEW

[chest pa]
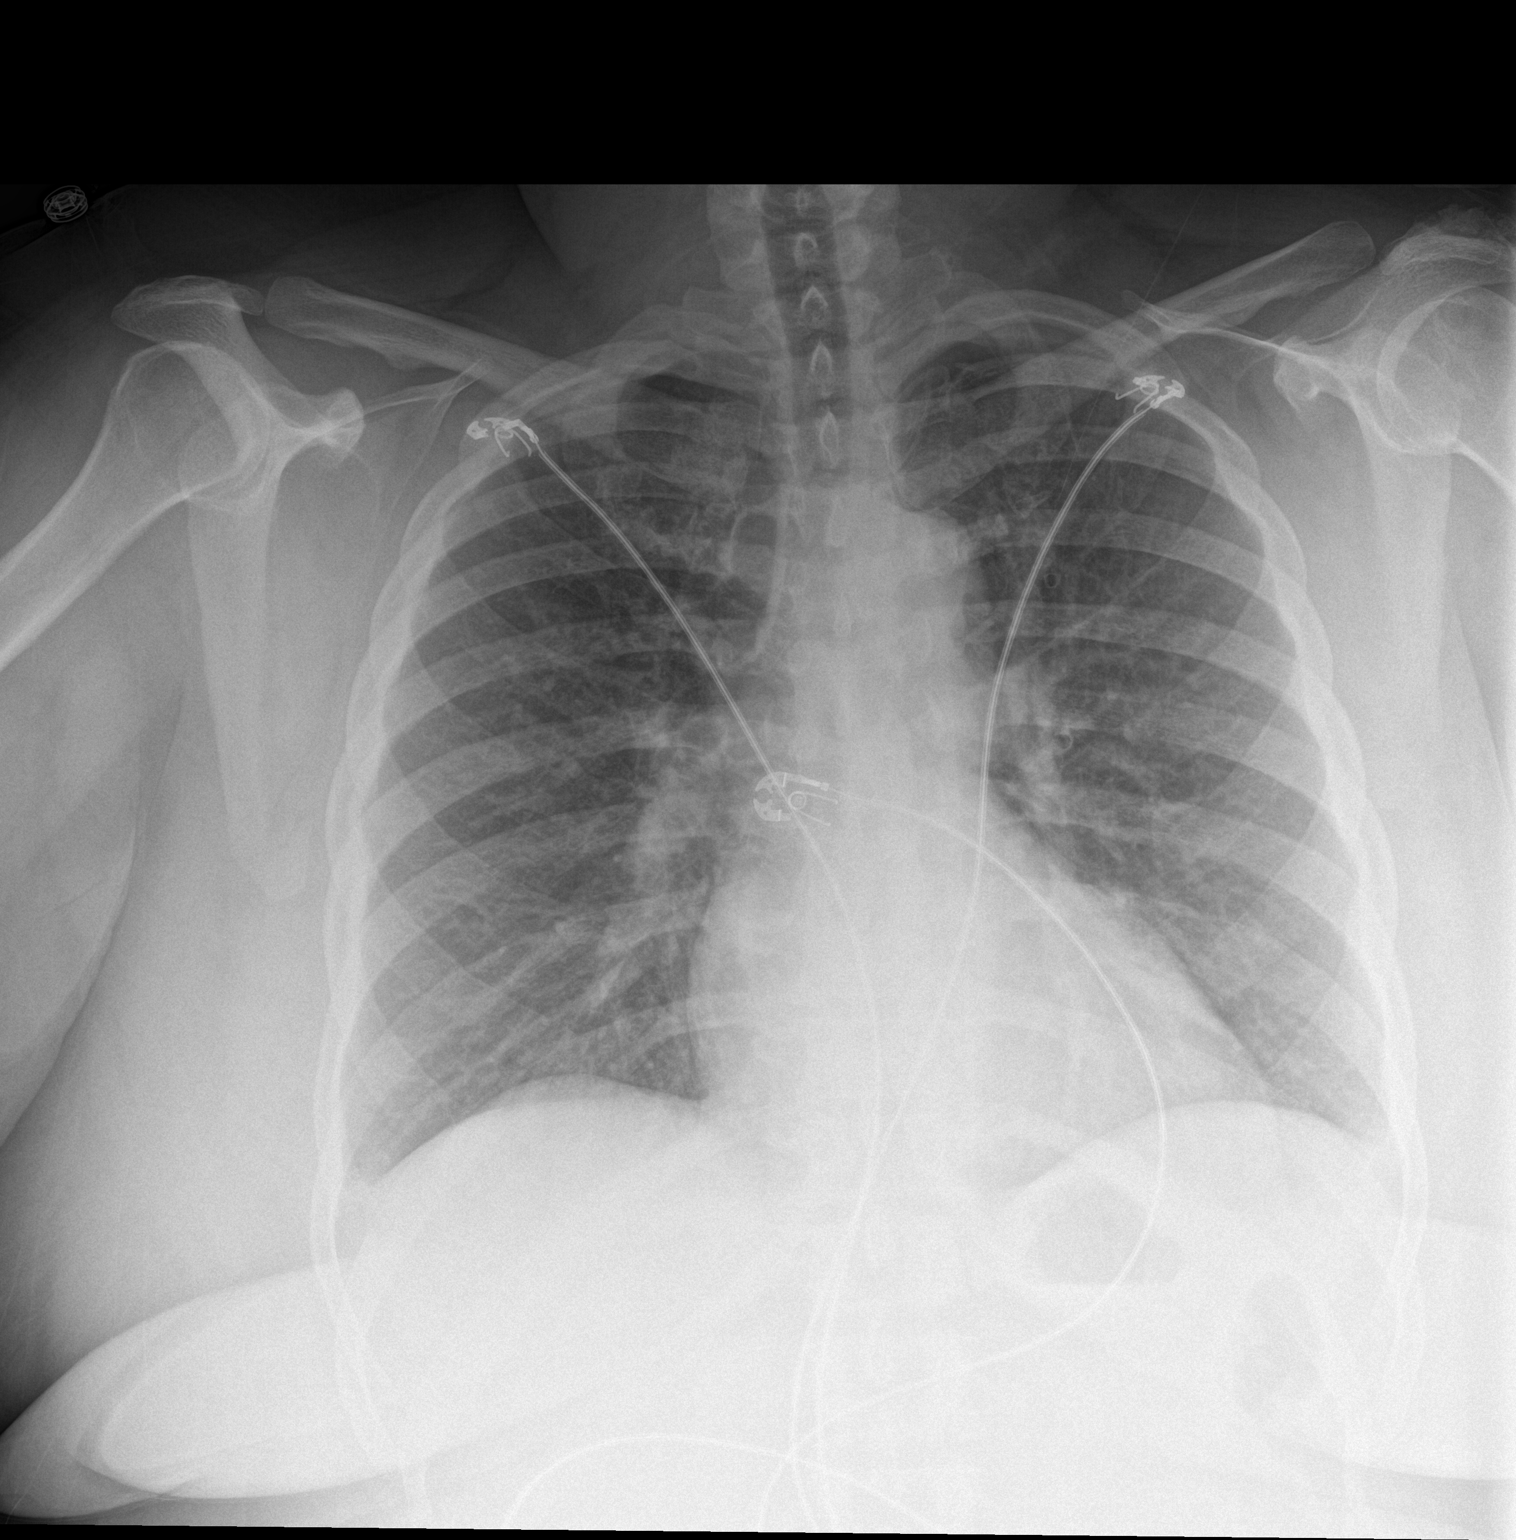

[chest lat]
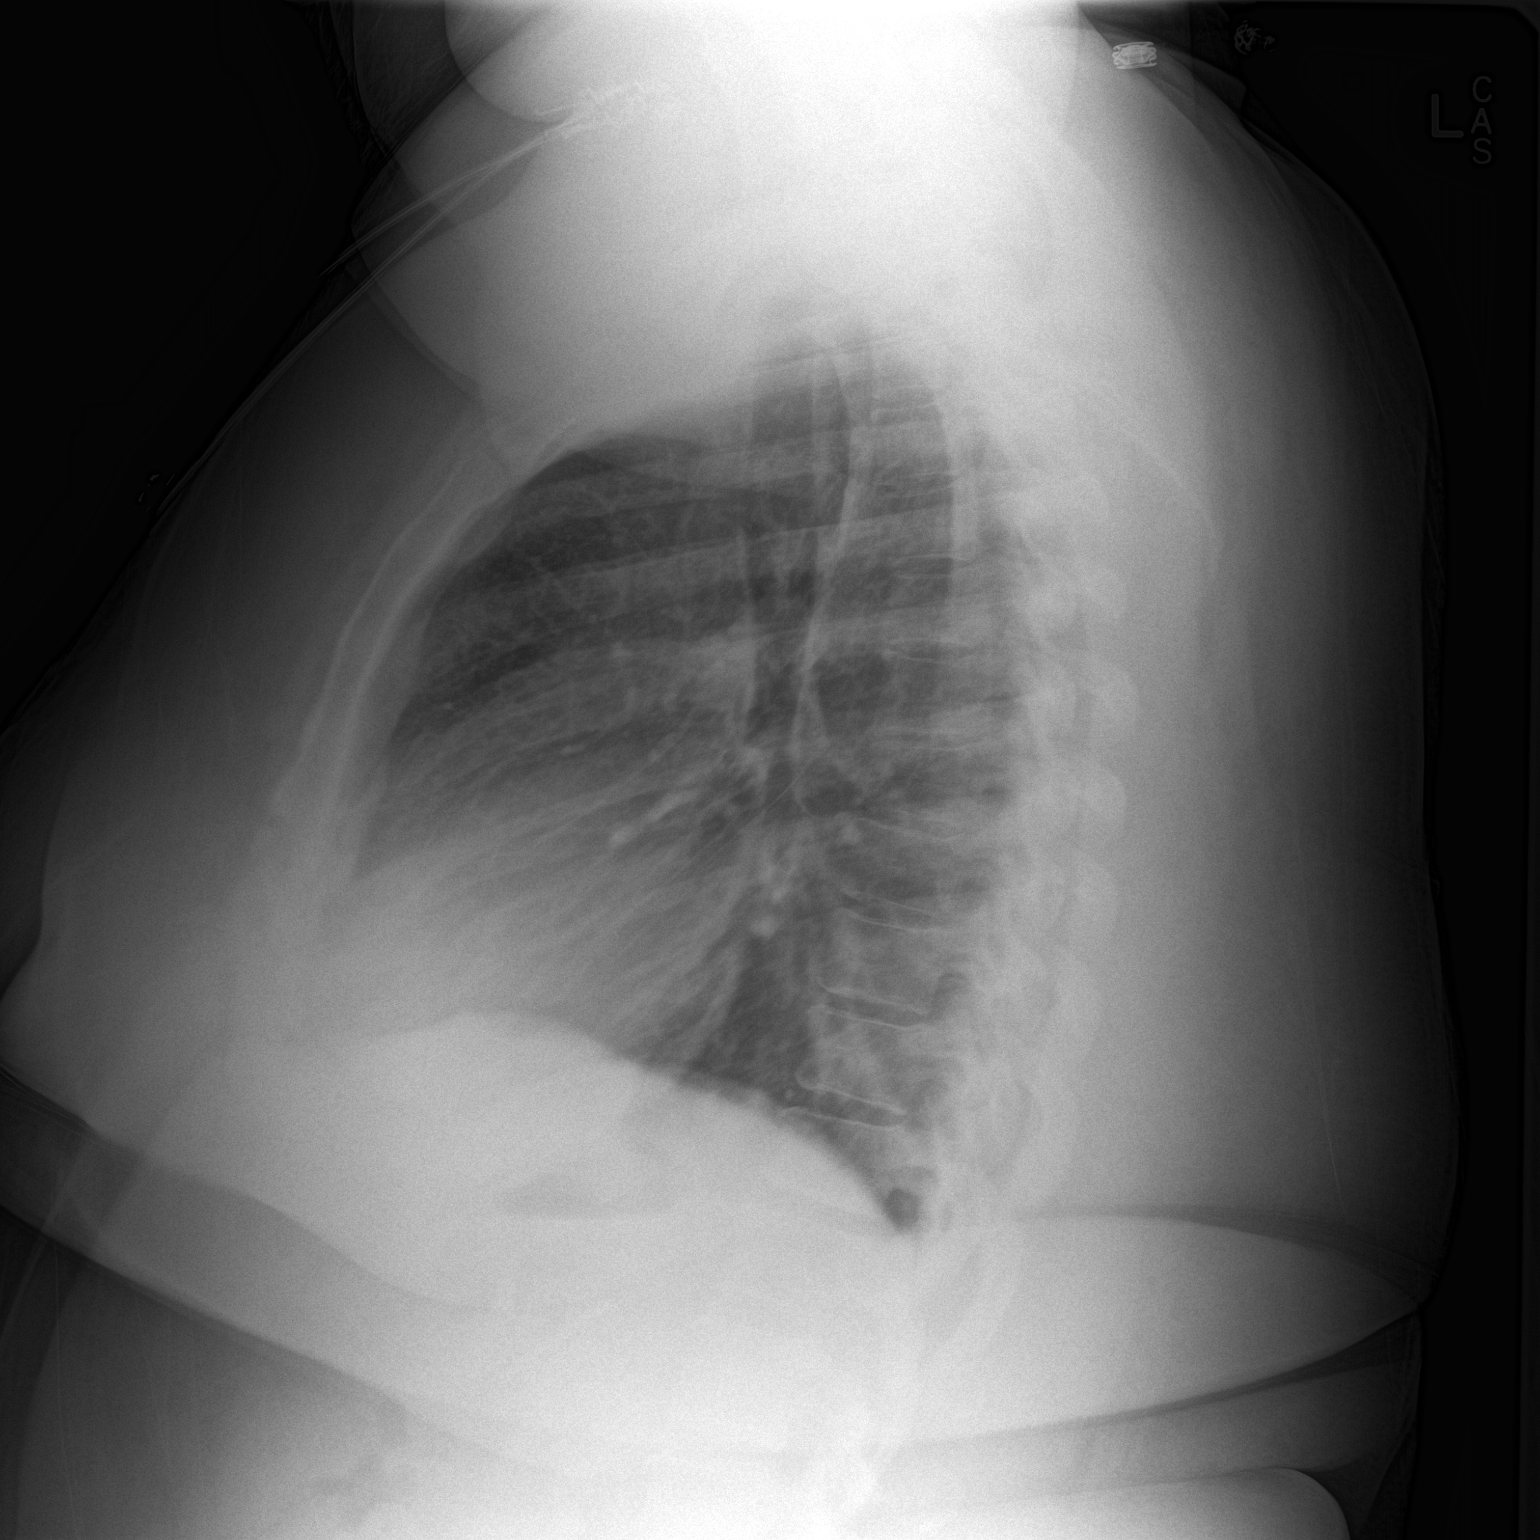

[2 of 2 positions shown; findings below may reference images not displayed]

FINDINGS: Lateral imaging is degraded by motion.  Exam is overall diagnostic.

Normal heart size and mediastinal contours. Stable mild bronchitic
markings. No acute infiltrate or edema. No effusion or pneumothorax.
No acute osseous findings.
IMPRESSION: No acute cardiopulmonary disease.

## 2015-11-23 ENCOUNTER — Ambulatory Visit (HOSPITAL_COMMUNITY)
Admission: EM | Admit: 2015-11-23 | Discharge: 2015-11-23 | Disposition: A | Discharge status: Home / Self Care | Payer: Self-pay | Attending: Family Medicine | Admitting: Family Medicine

## 2015-11-23 DIAGNOSIS — R0600 Dyspnea, unspecified: Secondary | ICD-10-CM

## 2015-11-23 DIAGNOSIS — M1712 Unilateral primary osteoarthritis, left knee: Secondary | ICD-10-CM

## 2015-11-23 DIAGNOSIS — J45901 Unspecified asthma with (acute) exacerbation: Secondary | ICD-10-CM

## 2015-11-23 DIAGNOSIS — R0603 Acute respiratory distress: Secondary | ICD-10-CM

## 2015-11-23 MED ORDER — PREDNISONE 20 MG PO TABS: 60.0000 mg | ORAL_TABLET | Freq: Every day | ORAL | 0 refills | Status: DC

## 2015-11-23 MED ORDER — IPRATROPIUM-ALBUTEROL 0.5-2.5 (3) MG/3ML IN SOLN
RESPIRATORY_TRACT | Status: AC
  Filled 2015-11-23: qty 3

## 2015-11-23 MED ORDER — IPRATROPIUM-ALBUTEROL 0.5-2.5 (3) MG/3ML IN SOLN
3.0000 mL | Freq: Once | RESPIRATORY_TRACT | Status: AC
  Administered 2015-11-23: 3 mL via RESPIRATORY_TRACT

## 2015-11-23 MED ORDER — METHYLPREDNISOLONE SODIUM SUCC 125 MG IJ SOLR
125.0000 mg | Freq: Once | INTRAMUSCULAR | Status: AC
  Administered 2015-11-23: 125 mg via INTRAMUSCULAR

## 2015-11-23 MED ORDER — METHYLPREDNISOLONE SODIUM SUCC 125 MG IJ SOLR
INTRAMUSCULAR | Status: AC
  Filled 2015-11-23: qty 2

## 2015-11-23 NOTE — ED Provider Notes (Addendum)
[place]    CSN: [id number] Arrival date & time: 11/23/15  [id number]  First Provider Contact:  None       History   Chief Complaint No chief complaint on file.   HPI April [name] is a 45 y.o. female.   HPI   Lt knee pain: chronic. Lidocaine patch and tramadol no longer working. Worse w/ ambulation.    1 day of wheezing and SOB.  Unsure of trigger Moved 3 days ago No steroids since DC though she was prescribed them at time of discharge on the 14th. Albuterol Q2 w/ intermittent relief while awake       Past Medical History:  Diagnosis Date  . Acanthosis nigricans   . Arthritis   . Asthma    Followed by Dr. Annamaria Boots (pulmonology); receives every other week omalizumab injections; has frequent exacerbations  . COPD (chronic obstructive pulmonary disease) ([place])    PFTs in 2002, FEV1/FVC 65, no post bronchodilater test done  . Depression   . GERD (gastroesophageal reflux disease)   . Headache(784.0)   . Helicobacter pylori (H. pylori) infection   . Hypertension, essential   . Insomnia   . Menorrhagia   . Morbid obesity ([place])   . Obesity   . Seasonal allergies   . Shortness of breath   . Sleep apnea    Sleep study 2008 - mild OSA, not enough events to titrate CPAP  . Tobacco user     Patient Active Problem List   Diagnosis Date Noted  . Asthma exacerbation 11/10/2015  . GERD (gastroesophageal reflux disease) 08/30/2015  . Generalized anxiety disorder 08/30/2015  . Seasonal allergic rhinitis 08/29/2013  . Acute asthma exacerbation 05/22/2012  . Asthma, chronic obstructive, without status asthmaticus ([place]) 05/07/2012  . Knee pain, bilateral 04/25/2011  . Obstructive sleep apnea 12/19/2010  . Cervical back pain with evidence of disc disease 04/08/2008  . Essential hypertension 07/31/2006  . Morbid obesity with body mass index of 50.0-59.9 in adult ([place]) 06/17/2006  . Major depressive disorder, recurrent episode ([place]) 04/10/2006    Past Surgical  History:  Procedure Laterality Date  . BREAST REDUCTION SURGERY  09/2011  . TUBAL LIGATION  1996   bilateral    OB History    No data available       Home Medications    Prior to Admission medications   Medication Sig Start Date End Date Taking? Authorizing Provider  acetaminophen (TYLENOL) 500 MG tablet Take 2 tablets (1,000 mg total) by mouth every 6 (six) hours as needed. Patient taking differently: Take 1,000 mg by mouth every 6 (six) hours as needed for mild pain.  08/30/15   [name], MD  albuterol (PROVENTIL HFA;VENTOLIN HFA) 108 (90 Base) MCG/ACT inhaler Inhale 1-2 puffs into the lungs every 4 (four) hours as needed for wheezing or shortness of breath. 11/12/15   [name], MD  albuterol (PROVENTIL) (2.5 MG/3ML) 0.083% nebulizer solution Take 3 mLs (2.5 mg total) by nebulization every 6 (six) hours as needed for wheezing or shortness of breath. 08/30/15   [name], MD  docusate sodium (STOOL SOFTENER) 100 MG capsule Take 100 mg by mouth 2 (two) times daily as needed. 10/24/10   Historical Provider, MD  fluticasone (FLONASE) 50 MCG/ACT nasal spray Place 2 sprays into both nostrils daily. 09/27/10   Historical Provider, MD  hydrochlorothiazide (HYDRODIURIL) 25 MG tablet Take 1 tablet (25 mg total) by mouth daily. 08/30/15   [name], MD  Ibuprofen-Diphenhydramine HCl (ADVIL  PM) 200-25 MG CAPS Take 2-4 tablets by mouth at bedtime as needed (pain).    Historical Provider, MD  ipratropium (ATROVENT) 0.02 % nebulizer solution Take 2.5 mLs (0.5 mg total) by nebulization 4 (four) times daily as needed for wheezing or shortness of breath. 08/30/15   [name], MD  loratadine (CLARITIN) 10 MG tablet Take 1 tablet (10 mg total) by mouth daily. 08/30/15   [name], MD  mometasone-formoterol [facility]) 200-5 MCG/ACT AERO Inhale 2 puffs into the lungs 2 (two) times daily. 11/12/15   [name], MD  omalizumab [place] Right) 150 MG injection Inject 150 mg into the skin every 14 (fourteen)  days.    Historical Provider, MD  pantoprazole (PROTONIX) 40 MG tablet Take 1 tablet (40 mg total) by mouth 2 (two) times daily. 11/12/15   [name], MD  predniSONE (DELTASONE) 10 MG tablet Take daily: 60 mg (6 tabs) for 7 days, 30 mg (3 tabs) for 3 days, 10 mg (1 tab) for 3 days, and 5 mg (1/2 tab) for 3 days, then stop. 11/11/15   Velna Ochs, MD  sertraline (ZOLOFT) 50 MG tablet Take 1 tablet (50 mg total) by mouth daily. 08/30/15   [name], MD  theophylline (THEODUR) 300 MG 12 hr tablet Take 1 tablet (300 mg total) by mouth daily. 10/26/15   Burgess Estelle, MD    Family History Family History  Problem Relation Age of Onset  . Asthma Daughter   . Cancer Paternal Aunt   . Asthma Maternal Grandmother   . Hypertension Mother     Social History Social History  Substance Use Topics  . Smoking status: Former Smoker    Years: 18.00    Types: Cigarettes    Quit date: 09/15/2014  . Smokeless tobacco: Former Systems developer    Quit date: 09/12/2014  . Alcohol use No     Allergies   Review of patient's allergies indicates no known allergies.   Review of Systems Review of Systems  Per HPI with all other pertinent systems negative.   Physical Exam Triage Vital Signs ED Triage Vitals  Enc Vitals Group     BP      Pulse      Resp      Temp      Temp src      SpO2      Weight      Height      Head Circumference      Peak Flow      Pain Score      Pain Loc      Pain Edu?      Excl. in [place]?    No data found.   Updated Vital Signs LMP 09/05/2015   Visual Acuity Right Eye Distance:   Left Eye Distance:   Bilateral Distance:    Right Eye Near:   Left Eye Near:    Bilateral Near:     Physical Exam Physical Exam  Constitutional: oriented to person, place, and time. appears well-developed and well-nourished. No distress.  HENT:  Head: Normocephalic and atraumatic.  Eyes: EOMI. PERRL.  Neck: Normal range of motion.  Cardiovascular: RRR, no m/r/g, 2+ distal  pulses,  Pulmonary/Chest: Patient initially very tight and wheezy and speaking in short sentences. O2 saturation is 90%. After DuoNeb and Solu-Medrol patient with markedly improved respiratory effort, intermittent wheezing bilaterally with increased aeration throughout.  Abdominal: Soft. Bowel sounds are normal. NonTTP, no distension.  Musculoskeletal: Normal range of motion. Non  ttp, no effusion.  Neurological: alert and oriented to person, place, and time.  Skin: Skin is warm. No rash noted. non diaphoretic.  Psychiatric: normal mood and affect. behavior is normal. Judgment and thought content normal.    UC Treatments / Results  Labs (all labs ordered are listed, but only abnormal results are displayed) Labs Reviewed - No data to display  EKG  EKG Interpretation None       Radiology No results found.  Procedures Procedures (including critical care time)  Medications Ordered in UC Medications - No data to display   Initial Impression / Assessment and Plan / UC Course  I have reviewed the triage vital signs and the nursing notes.  Pertinent labs & imaging results that were available during my care of the patient were reviewed by me and considered in my medical decision making (see chart for details).  Clinical Course    Asthma exacerbation. Patient with severe asthma. Markedly improved 125 mg of Solu-Medrol IM and  and DuoNeb. O2 saturations remained above 90% throughout with improvement to approximately 98% at time of discharge. Will continue patient on prednisone 60 mg with first dose in 12 hours and then every 24 hours after that for 5 days. Patient will use her albuterol every 4 hours for the next 24 hours and every 4 hours as needed. Patient with emergency room she gets worse.  Reviewed x-ray of left knee obtained recently which shows osteoarthritis of the knee. This is likely secondary to chronic weight and changing of her gait. Ace wrap applied. Patient to follow-up  with orthopedics or sports medicine for additional therapy at this time. Steroids will likely help with this short-term.  Final Clinical Impressions(s) / UC Diagnoses   Final diagnoses:  None    New Prescriptions New Prescriptions   No medications on file     Waldemar Dickens, MD 11/23/15 [id number]    Waldemar Dickens, MD 11/23/15 2048

## 2015-11-23 NOTE — Discharge Instructions (Signed)
You're suffering from another asthma exacerbation. This was fortunately reversed in the clinic today. Please make sure you take your prednisone 60 mg every morning with breakfast. For the next 24 hours please use your albuterol every 4 hours. If your symptoms get worse please go to the emergency room. He will likely need to see an orthopedic surgeon in the near future for your left knee pain. Please continue your current treatment modalities and use the Ace wrap as needed for additional support. The steroids will likely help with this pain.

## 2015-11-23 NOTE — ED Triage Notes (Signed)
Patient c/o left knee pain and asthma flare up onset today. Patient reports that she has arthritis in her knee and has a lot of pain. She feels short of breath and is wheezing on exam. Patient is in NAD.

## 2015-11-26 ENCOUNTER — Emergency Department (HOSPITAL_COMMUNITY): Payer: Self-pay

## 2015-11-26 ENCOUNTER — Observation Stay (HOSPITAL_COMMUNITY)
Admission: EM | Admit: 2015-11-26 | Discharge: 2015-11-27 | Disposition: A | Discharge status: Home / Self Care | Payer: Self-pay | Attending: Internal Medicine | Admitting: Internal Medicine

## 2015-11-26 ENCOUNTER — Encounter (HOSPITAL_COMMUNITY): Payer: Self-pay

## 2015-11-26 DIAGNOSIS — J45901 Unspecified asthma with (acute) exacerbation: Secondary | ICD-10-CM

## 2015-11-26 DIAGNOSIS — J441 Chronic obstructive pulmonary disease with (acute) exacerbation: Principal | ICD-10-CM | POA: Insufficient documentation

## 2015-11-26 DIAGNOSIS — K219 Gastro-esophageal reflux disease without esophagitis: Secondary | ICD-10-CM | POA: Insufficient documentation

## 2015-11-26 DIAGNOSIS — E876 Hypokalemia: Secondary | ICD-10-CM | POA: Insufficient documentation

## 2015-11-26 DIAGNOSIS — Z825 Family history of asthma and other chronic lower respiratory diseases: Secondary | ICD-10-CM | POA: Insufficient documentation

## 2015-11-26 DIAGNOSIS — I10 Essential (primary) hypertension: Secondary | ICD-10-CM | POA: Insufficient documentation

## 2015-11-26 DIAGNOSIS — Z6841 Body Mass Index (BMI) 40.0 and over, adult: Secondary | ICD-10-CM | POA: Insufficient documentation

## 2015-11-26 DIAGNOSIS — Z87891 Personal history of nicotine dependence: Secondary | ICD-10-CM | POA: Insufficient documentation

## 2015-11-26 LAB — CBC WITH DIFFERENTIAL/PLATELET
Basophils Absolute: 0 10*3/uL (ref 0.0–0.1)
Basophils Relative: 0 %
EOS ABS: 0.5 10*3/uL (ref 0.0–0.7)
Eosinophils Relative: 3 %
HEMATOCRIT: 37.5 % (ref 36.0–46.0)
HEMOGLOBIN: 11.6 g/dL — AB (ref 12.0–15.0)
LYMPHS ABS: 3.4 10*3/uL (ref 0.7–4.0)
Lymphocytes Relative: 22 %
MCH: 27.6 pg (ref 26.0–34.0)
MCHC: 30.9 g/dL (ref 30.0–36.0)
MCV: 89.3 fL (ref 78.0–100.0)
MONO ABS: 0.9 10*3/uL (ref 0.1–1.0)
MONOS PCT: 6 %
NEUTROS PCT: 69 %
Neutro Abs: 10.5 10*3/uL — ABNORMAL HIGH (ref 1.7–7.7)
Platelets: 409 10*3/uL — ABNORMAL HIGH (ref 150–400)
RBC: 4.2 MIL/uL (ref 3.87–5.11)
RDW: 15.6 % — ABNORMAL HIGH (ref 11.5–15.5)
WBC: 15.3 10*3/uL — ABNORMAL HIGH (ref 4.0–10.5)

## 2015-11-26 LAB — BASIC METABOLIC PANEL
Anion gap: 8 (ref 5–15)
BUN: 6 mg/dL (ref 6–20)
CHLORIDE: 104 mmol/L (ref 101–111)
CO2: 26 mmol/L (ref 22–32)
CREATININE: 0.76 mg/dL (ref 0.44–1.00)
Calcium: 8.1 mg/dL — ABNORMAL LOW (ref 8.9–10.3)
GFR calc non Af Amer: >60 mL/min (ref >60)
GLUCOSE: 110 mg/dL — AB (ref 65–99)
Potassium: 2.6 mmol/L — CL (ref 3.5–5.1)
Sodium: 138 mmol/L (ref 135–145)

## 2015-11-26 MED ORDER — IPRATROPIUM-ALBUTEROL 0.5-2.5 (3) MG/3ML IN SOLN
3.0000 mL | Freq: Once | RESPIRATORY_TRACT | Status: AC
  Administered 2015-11-26: 3 mL via RESPIRATORY_TRACT
  Filled 2015-11-26: qty 3

## 2015-11-26 MED ORDER — MAGNESIUM SULFATE 2 GM/50ML IV SOLN
2.0000 g | Freq: Once | INTRAVENOUS | Status: AC
  Administered 2015-11-26: 2 g via INTRAVENOUS
  Filled 2015-11-26: qty 50

## 2015-11-26 MED ORDER — SODIUM CHLORIDE 0.9 % IV BOLUS (SEPSIS)
500.0000 mL | Freq: Once | INTRAVENOUS | Status: AC
  Administered 2015-11-26: 500 mL via INTRAVENOUS

## 2015-11-26 MED ORDER — METHYLPREDNISOLONE SODIUM SUCC 125 MG IJ SOLR
125.0000 mg | Freq: Once | INTRAMUSCULAR | Status: AC
  Administered 2015-11-26: 125 mg via INTRAVENOUS
  Filled 2015-11-26: qty 2

## 2015-11-26 MED ORDER — PANTOPRAZOLE SODIUM 40 MG PO TBEC
40.0000 mg | DELAYED_RELEASE_TABLET | Freq: Two times a day (BID) | ORAL | Status: DC
  Administered 2015-11-27: 40 mg via ORAL
  Filled 2015-11-26: qty 1

## 2015-11-26 MED ORDER — POTASSIUM CHLORIDE CRYS ER 20 MEQ PO TBCR
40.0000 meq | EXTENDED_RELEASE_TABLET | Freq: Once | ORAL | Status: AC
  Administered 2015-11-26: 40 meq via ORAL
  Filled 2015-11-26 (×2): qty 2

## 2015-11-26 MED ORDER — PANTOPRAZOLE SODIUM 40 MG PO TBEC: 40.0000 mg | DELAYED_RELEASE_TABLET | Freq: Every day | ORAL | Status: DC

## 2015-11-26 MED ORDER — POTASSIUM CHLORIDE 10 MEQ/100ML IV SOLN
10.0000 meq | INTRAVENOUS | Status: DC
  Administered 2015-11-27: 10 meq via INTRAVENOUS
  Filled 2015-11-26: qty 100

## 2015-11-26 MED ORDER — ALBUTEROL (5 MG/ML) CONTINUOUS INHALATION SOLN
15.0000 mg/h | INHALATION_SOLUTION | Freq: Once | RESPIRATORY_TRACT | Status: AC
  Administered 2015-11-26: 15 mg/h via RESPIRATORY_TRACT
  Filled 2015-11-26: qty 20

## 2015-11-26 MED ORDER — ALBUTEROL SULFATE (2.5 MG/3ML) 0.083% IN NEBU
INHALATION_SOLUTION | RESPIRATORY_TRACT | Status: AC
  Filled 2015-11-26: qty 6

## 2015-11-26 MED ORDER — ALBUTEROL SULFATE (2.5 MG/3ML) 0.083% IN NEBU
5.0000 mg | INHALATION_SOLUTION | Freq: Once | RESPIRATORY_TRACT | Status: AC
  Administered 2015-11-26: 5 mg via RESPIRATORY_TRACT

## 2015-11-26 MED ORDER — PREDNISONE 50 MG PO TABS
60.0000 mg | ORAL_TABLET | Freq: Every day | ORAL | Status: DC
  Administered 2015-11-27: 60 mg via ORAL
  Filled 2015-11-26: qty 1

## 2015-11-26 MED ORDER — SERTRALINE HCL 50 MG PO TABS
50.0000 mg | ORAL_TABLET | Freq: Every day | ORAL | Status: DC
  Administered 2015-11-27: 50 mg via ORAL
  Filled 2015-11-26: qty 1

## 2015-11-26 NOTE — ED Provider Notes (Signed)
Emergency Department Provider Note   I have reviewed the triage vital signs and the nursing notes.   HISTORY  Chief Complaint Asthma   HPI April [name] is a 45 y.o. female with PMH of asthma, obesity, GERD, HTN presents to the emergency department for evaluation of difficulty breathing consistent with prior asthma exacerbations. Symptoms began yesterday evening. The patient has been doing nebulizer therapy every 2 hours and took a leftover dose of prednisone yesterday with minimal relief in symptoms. She denies associated fever or productive cough. She has had a mild headache but no other symptoms such as chest pain, diaphoresis, or generalized weakness. No travel history. No history of blood clots.   Patient reports prior hospitalizations for asthma flares. She has not required intubation or intensive care unit admission in the past.    Past Medical History:  Diagnosis Date  . Acanthosis nigricans   . Arthritis   . Asthma    Followed by Dr. Annamaria Boots (pulmonology); receives every other week omalizumab injections; has frequent exacerbations  . COPD (chronic obstructive pulmonary disease) ([place])    PFTs in 2002, FEV1/FVC 65, no post bronchodilater test done  . Depression   . GERD (gastroesophageal reflux disease)   . Headache(784.0)   . Helicobacter pylori (H. pylori) infection   . Hypertension   . Hypertension, essential   . Insomnia   . Menorrhagia   . Morbid obesity ([place])   . Obesity   . Seasonal allergies   . Shortness of breath   . Sleep apnea    Sleep study 2008 - mild OSA, not enough events to titrate CPAP  . Tobacco user     Patient Active Problem List   Diagnosis Date Noted  . Asthma exacerbation 11/10/2015  . GERD (gastroesophageal reflux disease) 08/30/2015  . Generalized anxiety disorder 08/30/2015  . Seasonal allergic rhinitis 08/29/2013  . Acute asthma exacerbation 05/22/2012  . Asthma, chronic obstructive, without status asthmaticus ([place]) 05/07/2012  .  Knee pain, bilateral 04/25/2011  . Obstructive sleep apnea 12/19/2010  . Cervical back pain with evidence of disc disease 04/08/2008  . Essential hypertension 07/31/2006  . Morbid obesity with body mass index of 50.0-59.9 in adult ([place]) 06/17/2006  . Major depressive disorder, recurrent episode ([place]) 04/10/2006    Past Surgical History:  Procedure Laterality Date  . BREAST REDUCTION SURGERY  09/2011  . TUBAL LIGATION  1996   bilateral    Current Outpatient Rx  . Order #: [id number] Class: Print  . Order #: [id number] Class: Print  . Order #: [id number] Class: Print  . Order #: [id number] Class: Historical Med  . Order #: [id number] Class: Historical Med  . Order #: [id number] Class: Print  . Order #: [id number] Class: Historical Med  . Order #: [id number] Class: Print  . Order #: [id number] Class: Print  . Order #: [id number] Class: Print  . Order #: [id number] Class: Historical Med  . Order #: [id number] Class: Print  . Order #: [id number] Class: Normal  . Order #: [id number] Class: Print  . Order #: [id number] Class: Normal    Allergies Review of patient's allergies indicates no known allergies.  Family History  Problem Relation Age of Onset  . Hypertension Mother   . Asthma Daughter   . Cancer Paternal Aunt   . Asthma Maternal Grandmother     Social History Social History  Substance Use Topics  . Smoking status: Former Smoker    Years: 18.00    Types: Cigarettes    Quit date: 09/15/2014  . Smokeless tobacco: Former [employer]  date: 09/12/2014  . Alcohol use No    Review of Systems  Constitutional: No fever/chills Eyes: No visual changes. ENT: No sore throat. Cardiovascular: Denies chest pain. Respiratory: Positive shortness of breath. Gastrointestinal: No abdominal pain.  No nausea, no vomiting.  No diarrhea.  No constipation. Genitourinary: Negative for dysuria. Musculoskeletal: Negative for back pain. Skin: Negative for rash. Neurological: Negative for headaches, focal  weakness or numbness.  10-point ROS otherwise negative.  ____________________________________________   PHYSICAL EXAM:  VITAL SIGNS: ED Triage Vitals  Enc Vitals Group     BP 11/26/15 1914 156/100     Pulse Rate 11/26/15 1914 115     Resp 11/26/15 1914 24     Temp 11/26/15 1914 98.1 F (36.7 C)     Temp Source 11/26/15 1914 Oral     SpO2 11/26/15 1914 95 %     Pain Score 11/26/15 2045 0    Constitutional: Alert and oriented. Notable tachypnea and increased WOB.  Eyes: Conjunctivae are normal. PERRL. EOMI. Head: Atraumatic. Nose: No congestion/rhinnorhea. Mouth/Throat: Mucous membranes are moist.  Oropharynx non-erythematous. Neck: No stridor.  Cardiovascular: Tachycardia. Good peripheral circulation. Grossly normal heart sounds.   Respiratory: Increased WOB with tachypnea. Diffuse expiratory wheezing throughout with poor air movement.  Gastrointestinal: Soft and nontender. No distention.  Musculoskeletal: No lower extremity tenderness nor edema. No gross deformities of extremities. Neurologic:  Normal speech and language. No gross focal neurologic deficits are appreciated.  Skin:  Skin is warm, dry and intact. No rash noted. Psychiatric: Mood and affect are normal. Speech and behavior are normal.  ____________________________________________   LABS (all labs ordered are listed, but only abnormal results are displayed)  Labs Reviewed  BASIC METABOLIC PANEL - Abnormal; Notable for the following:       Result Value   Potassium 2.6 (*)    Glucose, Bld 110 (*)    Calcium 8.1 (*)    All other components within normal limits  CBC WITH DIFFERENTIAL/PLATELET - Abnormal; Notable for the following:    WBC 15.3 (*)    Hemoglobin 11.6 (*)    RDW 15.6 (*)    Platelets 409 (*)    Neutro Abs 10.5 (*)    All other components within normal limits   ____________________________________________  EKG  Reviewed in M[facility].   ____________________________________________  RADIOLOGY  Dg Chest 2 View  Result Date: 11/26/2015 CLINICAL DATA:  Asthma exacerbation. EXAM: CHEST  2 VIEW COMPARISON:  Radiographs 11/10/2015, most recent chest CT 08/23/2015 FINDINGS: Chronic bronchial thickening and borderline hyperinflation. No focal airspace disease. Heart size and mediastinal contours are unchanged. No pleural effusion or pneumothorax. Unchanged osseous structures. IMPRESSION: Chronic bronchial thickening.  No new abnormality. Electronically Signed   By: Jeb Levering M.D.   On: 11/26/2015 20:04  ____________________________________________   PROCEDURES  Procedure(s) performed:   Procedures  CRITICAL CARE Performed by: Margette Fast Total critical care time: 35 minutes Critical care time was exclusive of separately billable procedures and treating other patients. Critical care was necessary to treat or prevent imminent or life-threatening deterioration. Critical care was time spent personally by me on the following activities: development of treatment plan with patient and/or surrogate as well as nursing, discussions with consultants, evaluation of patient's response to treatment, examination of patient, obtaining history from patient or surrogate, ordering and performing treatments and interventions, ordering and review of laboratory studies, ordering and review of radiographic studies, pulse oximetry and re-evaluation of patient's condition.  Nanda Quinton, MD Emergency Medicine  ____________________________________________  INITIAL IMPRESSION / ASSESSMENT AND PLAN / ED COURSE  Pertinent labs & imaging results that were available during my care of the patient were reviewed by me and considered in my medical decision making (see chart for details).  Patient resents to the emergency department for evaluation of asthma exacerbation. She has moderate increased work of breathing with expiratory wheezing  throughout. Exam is symmetrical. Given her increased work of breathing I plan for DuoNeb, IV Solu-Medrol, IV fluids, IV magnesium and reassess. Patient does not require BiPAP at this time. Low suspicion for alternative etiology of dyspnea such as ACS or pulmonary embolism. Discussed my impression and plan with the patient in detail answer questions.  10:45 PM Moving to CAT treatment. Patient with continued dyspnea but not acutely worsening. Will likely require admission but does not require BiPAP at this time.   Discussed patient's case with IM teaching team.  Recommend admission. They will be down to place orders. Patient and family (if present) updated with plan. Care transferred to IM teaching service.  I reviewed all nursing notes, vitals, pertinent old records, EKGs, labs, imaging (as available). ____________________________________________  FINAL CLINICAL IMPRESSION(S) / ED DIAGNOSES  Final diagnoses:  Asthma exacerbation     MEDICATIONS GIVEN DURING THIS VISIT:  Medications  sodium chloride 0.9 % bolus 500 mL (not administered)  ipratropium-albuterol (DUONEB) 0.5-2.5 (3) MG/3ML nebulizer solution 3 mL (not administered)  methylPREDNISolone sodium succinate (SOLU-MEDROL) 125 mg/2 mL injection 125 mg (not administered)  magnesium sulfate IVPB 2 g 50 mL (not administered)  albuterol (PROVENTIL) (2.5 MG/3ML) 0.083% nebulizer solution 5 mg (5 mg Nebulization Given 11/26/15 1914)     NEW OUTPATIENT MEDICATIONS STARTED DURING THIS VISIT:  None   Note:  This document was prepared using Dragon voice recognition software and may include unintentional dictation errors.  Nanda Quinton, MD Emergency Medicine   Margette Fast, MD 11/26/15 [phone]

## 2015-11-26 NOTE — H&P (Signed)
Date: 11/26/2015               Patient Name:  April [name] MRN: [id number]  DOB: 08-05-1970 Age / Sex: 45 y.o., female   PCP: Deneise Lever, MD         Medical Service: Internal Medicine Teaching Service         Attending Physician: Dr. Aldine Contes, MD    First Contact: Dr. Philipp Ovens Pager: [phone]  Second Contact: Dr. Genene Churn Pager: [phone]       After Hours (After 5p/  First Contact Pager: [phone]  weekends / holidays): Second Contact Pager: [phone]   Chief Complaint: "My breathing's getting bad again."  History of Present Illness: 45 year old woman with severe persistent asthma, obesity, HTN, and GERD who presents with 1 day of worsening dyspnea and wheezing.  She was admitted 7/14-7/17/17 for asthma exacerbation, and discharged on budesonide, theophylline, and a prednisone taper, with instructions to see her pulmonologist for Xolair injections.  Took prednisone taper as ordered.  Her breathing was good until this past Tuesday, 7/25.  The prior day, she moved into a new home and was dusty and cleaning.  She took her inhaler and nebulizer treatments more frequently, but her breathing and chest tightness became worse.  She presented to urgent care on 7/27 and was given solumedrol and duonebs with improvement, and discharged again with PO prednisone.  She felt better on Friday, but by Saturday night her symptoms were again worsening and not controlled with her home meds.  Occasional non-productive cough.  No fevers, chest pain, sick contacts.  In the ED, she was given magnesium, solumedrol, and duonebs.  Says she's feeling better already, thinks she'll be ready to go home tomorrow.  Has had many prior hospitalizations for asthma, more in last 6 months.  Thinks her last house had mold which was worsening her asthma.  Meds:  Prescriptions Prior to Admission  Medication Sig Dispense Refill Last Dose  . acetaminophen (TYLENOL) 500 MG tablet Take 2 tablets (1,000 mg total) by  mouth every 6 (six) hours as needed. (Patient taking differently: Take 1,000 mg by mouth every 6 (six) hours as needed for mild pain. ) 90 tablet 3 2 weeks at Unknown time  . albuterol (PROVENTIL HFA;VENTOLIN HFA) 108 (90 Base) MCG/ACT inhaler Inhale 1-2 puffs into the lungs every 4 (four) hours as needed for wheezing or shortness of breath. 6 Inhaler 5 11/26/2015 at Unknown time  . albuterol (PROVENTIL) (2.5 MG/3ML) 0.083% nebulizer solution Take 3 mLs (2.5 mg total) by nebulization every 6 (six) hours as needed for wheezing or shortness of breath. (Patient taking differently: Take 2.5 mg by nebulization every 2 (two) hours as needed for wheezing or shortness of breath. ) 150 mL 1 11/26/2015 at Unknown time  . docusate sodium (STOOL SOFTENER) 100 MG capsule Take 100 mg by mouth 2 (two) times daily as needed for mild constipation.    2 weeks  . esomeprazole (NEXIUM) 40 MG capsule Take 40 mg by mouth daily.  3 11/25/2015 at Unknown time  . fluticasone (FLONASE) 50 MCG/ACT nasal spray Place 2 sprays into both nostrils daily.   11/25/2015 at Unknown time  . hydrochlorothiazide (HYDRODIURIL) 25 MG tablet Take 1 tablet (25 mg total) by mouth daily. 90 tablet 3 11/26/2015 at Unknown time  . ibuprofen (ADVIL,MOTRIN) 200 MG tablet Take 800 mg by mouth 2 (two) times daily as needed for moderate pain (knee pain).   11/25/2015 at Unknown  time  . ipratropium (ATROVENT) 0.02 % nebulizer solution Take 2.5 mLs (0.5 mg total) by nebulization 4 (four) times daily as needed for wheezing or shortness of breath. 75 mL 2 11/26/2015 at Unknown time  . loratadine (CLARITIN) 10 MG tablet Take 1 tablet (10 mg total) by mouth daily. 90 tablet 3 11/25/2015 at Unknown time  . mometasone-formoterol (DULERA) 200-5 MCG/ACT AERO Inhale 2 puffs into the lungs 2 (two) times daily. 13 g 1 11/25/2015 at Unknown time  . nicotine (NICODERM CQ - DOSED IN MG/24 HOURS) 14 mg/24hr patch Place 14 mg onto the skin 3 (three) times a week.   11/25/2015 at  Unknown time  . omalizumab (XOLAIR) 150 MG injection Inject 150 mg into the skin every 14 (fourteen) days.   3 weeks  . pantoprazole (PROTONIX) 40 MG tablet Take 1 tablet (40 mg total) by mouth 2 (two) times daily. 60 tablet 0 11/25/2015 at Unknown time  . predniSONE (DELTASONE) 20 MG tablet Take 3 tablets (60 mg total) by mouth daily with breakfast. 15 tablet 0 11/25/2015 at Unknown time  . sertraline (ZOLOFT) 50 MG tablet Take 1 tablet (50 mg total) by mouth daily. 90 tablet 3 11/25/2015 at Unknown time  . theophylline (THEODUR) 300 MG 12 hr tablet Take 1 tablet (300 mg total) by mouth daily. 90 tablet 3 11/25/2015 at Unknown time     Allergies: Allergies as of 11/26/2015  . (No Known Allergies)   Past Medical History:  Diagnosis Date  . Acanthosis nigricans   . Arthritis   . Asthma    Followed by Dr. Annamaria Boots (pulmonology); receives every other week omalizumab injections; has frequent exacerbations  . COPD (chronic obstructive pulmonary disease) ([place])    PFTs in 2002, FEV1/FVC 65, no post bronchodilater test done  . Depression   . GERD (gastroesophageal reflux disease)   . Headache(784.0)   . Helicobacter pylori (H. pylori) infection   . Hypertension   . Hypertension, essential   . Insomnia   . Menorrhagia   . Morbid obesity ([place])   . Obesity   . Seasonal allergies   . Shortness of breath   . Sleep apnea    Sleep study 2008 - mild OSA, not enough events to titrate CPAP  . Tobacco user     Family History: Asthma in paternal grandmother and child.  Mother with HTN.  Social History: Former smoker (quit ~2 months ago, ~5 pack year history).  No alcohol or drugs.  Review of Systems: A complete ROS was negative except as per HPI.  Physical Exam: Blood pressure (!) 185/112, pulse 99, temperature 98.1 F (36.7 C), temperature source Oral, resp. rate 18, SpO2 100 %.  Physical Exam  Constitutional: She is oriented to person, place, and time.  Obese woman taking nebulizer  treatment, in no distress.  HENT:  Head: Normocephalic and atraumatic.  Mouth/Throat: Oropharynx is clear and moist.  Eyes: Conjunctivae are normal. Pupils are equal, round, and reactive to light. No scleral icterus.  Neck: Normal range of motion. Neck supple.  Cardiovascular: Normal rate, regular rhythm, normal heart sounds and intact distal pulses.   Pulmonary/Chest: No stridor.  Diffuse expiratory wheezes in all lung fields, moderately prolonged expiratory phase.  Good air movement, no increased work of breathing or accessory muscle use.  Abdominal:  Obese, soft, non-tender, non-distended  Musculoskeletal: She exhibits no edema, tenderness or deformity.  Lymphadenopathy:    She has no cervical adenopathy.  Neurological: She is alert and oriented to person, place,  and time. She exhibits normal muscle tone. Coordination normal.  Skin: Skin is warm and dry. No erythema.  Psychiatric: She has a normal mood and affect. Her behavior is normal. Judgment and thought content normal.    EKG: NSR, prolonged QT.  No ST changes, TWI, Q waves.  Rare PVC.  CXR: Chronic bronchial thickening, no focal airspace disease, no effusion or PTX  CBC Latest Ref Rng & Units 11/26/2015 11/11/2015 11/10/2015  WBC 4.0 - 10.5 K/uL 15.3(H) 22.2(H) 19.9(H)  Hemoglobin 12.0 - 15.0 g/dL 11.6(L) 11.9(L) 11.9(L)  Hematocrit 36.0 - 46.0 % 37.5 37.4 38.2  Platelets 150 - 400 K/uL 409(H) 394 377   CMP Latest Ref Rng & Units 11/26/2015 11/11/2015 11/10/2015  Glucose 65 - 99 mg/dL 110(H) 128(H) 130(H)  BUN 6 - 20 mg/dL 6 8 9   Creatinine 0.44 - 1.00 mg/dL 0.76 0.51 0.56  Sodium 135 - 145 mmol/L 138 136 138  Potassium 3.5 - 5.1 mmol/L 2.6(LL) 4.1 2.9(L)  Chloride 101 - 111 mmol/L 104 102 103  CO2 22 - 32 mmol/L 26 27 28   Calcium 8.9 - 10.3 mg/dL 8.1(L) 8.8(L) 8.7(L)  Total Protein 6.5 - 8.1 g/dL - - 5.7(L)  Total Bilirubin 0.3 - 1.2 mg/dL - - 0.4  Alkaline Phos 38 - 126 U/L - - 63  AST 15 - 41 U/L - - 15  ALT 14 - 54  U/L - - 18     Assessment & Plan by Problem: Active Problems:   Asthma exacerbation  45 year old woman with severe persistent asthma and multiple recent hospitalizations who presents with another asthma exacerbation.  Possible environmental trigger of moving and dusting/cleaning.  Failed outpatient management with solumedrol and PO prednisone.  #Asthma Exacerbation On LABA, inhaled corticosteroid, and theophylline for controller meds.  -Duonebs Q4h PRN -Albuterol nebs Q2h PRN -Continue home loratadine, -60 prednisone, plan for slow taper on discharge -Pulmonology f/u for refractory severe persistent asthma, omalizumab  #Hypokalemia In context of beta agonist use -Telemetry -Replete K -Repeat BMP in AM  #HTN #GERD #Anxiety -Continue home meds   Dispo: Admit patient to Observation with expected length of stay less than 2 midnights.  Signed: Minus Liberty, MD 11/26/2015, 11:41 PM  Pager: [phone]

## 2015-11-26 NOTE — ED Triage Notes (Signed)
Pt states asthma exacerbation since yesterday. States inhaler has run out. Pt states recently moved to new house.

## 2015-11-27 DIAGNOSIS — J4551 Severe persistent asthma with (acute) exacerbation: Secondary | ICD-10-CM

## 2015-11-27 LAB — CBC
HEMATOCRIT: 36.2 % (ref 36.0–46.0)
Hemoglobin: 11.1 g/dL — ABNORMAL LOW (ref 12.0–15.0)
MCH: 27.4 pg (ref 26.0–34.0)
MCHC: 30.7 g/dL (ref 30.0–36.0)
MCV: 89.4 fL (ref 78.0–100.0)
Platelets: 377 10*3/uL (ref 150–400)
RBC: 4.05 MIL/uL (ref 3.87–5.11)
RDW: 15.4 % (ref 11.5–15.5)
WBC: 14.9 10*3/uL — AB (ref 4.0–10.5)

## 2015-11-27 LAB — BASIC METABOLIC PANEL
Anion gap: 11 (ref 5–15)
CHLORIDE: 103 mmol/L (ref 101–111)
CO2: 22 mmol/L (ref 22–32)
Calcium: 7.8 mg/dL — ABNORMAL LOW (ref 8.9–10.3)
Creatinine, Ser: 0.79 mg/dL (ref 0.44–1.00)
GFR calc Af Amer: >60 mL/min (ref >60)
GFR calc non Af Amer: >60 mL/min (ref >60)
GLUCOSE: 187 mg/dL — AB (ref 65–99)
POTASSIUM: 3.8 mmol/L (ref 3.5–5.1)
SODIUM: 136 mmol/L (ref 135–145)

## 2015-11-27 MED ORDER — ALBUTEROL SULFATE (2.5 MG/3ML) 0.083% IN NEBU
3.0000 mL | INHALATION_SOLUTION | RESPIRATORY_TRACT | Status: DC | PRN
  Administered 2015-11-27 (×2): 3 mL via RESPIRATORY_TRACT
  Filled 2015-11-27 (×2): qty 3

## 2015-11-27 MED ORDER — HYDROCHLOROTHIAZIDE 25 MG PO TABS: 25.0000 mg | ORAL_TABLET | Freq: Every day | ORAL | Status: DC

## 2015-11-27 MED ORDER — HYDROCHLOROTHIAZIDE 25 MG PO TABS
25.0000 mg | ORAL_TABLET | Freq: Every day | ORAL | Status: DC
  Administered 2015-11-27: 25 mg via ORAL
  Filled 2015-11-27: qty 1

## 2015-11-27 MED ORDER — MOMETASONE FURO-FORMOTEROL FUM 200-5 MCG/ACT IN AERO: 2.0000 | INHALATION_SPRAY | Freq: Two times a day (BID) | RESPIRATORY_TRACT | 1 refills | Status: DC

## 2015-11-27 MED ORDER — ACETAMINOPHEN 650 MG RE SUPP: 650.0000 mg | Freq: Four times a day (QID) | RECTAL | Status: DC | PRN

## 2015-11-27 MED ORDER — MOMETASONE FURO-FORMOTEROL FUM 200-5 MCG/ACT IN AERO
2.0000 | INHALATION_SPRAY | Freq: Two times a day (BID) | RESPIRATORY_TRACT | Status: DC
  Administered 2015-11-27: 2 via RESPIRATORY_TRACT
  Filled 2015-11-27: qty 8.8

## 2015-11-27 MED ORDER — RAMELTEON 8 MG PO TABS
8.0000 mg | ORAL_TABLET | Freq: Every day | ORAL | Status: DC
  Administered 2015-11-27: 8 mg via ORAL
  Filled 2015-11-27 (×2): qty 1

## 2015-11-27 MED ORDER — IPRATROPIUM-ALBUTEROL 0.5-2.5 (3) MG/3ML IN SOLN
3.0000 mL | Freq: Three times a day (TID) | RESPIRATORY_TRACT | Status: DC
  Administered 2015-11-27: 3 mL via RESPIRATORY_TRACT
  Filled 2015-11-27: qty 3

## 2015-11-27 MED ORDER — POTASSIUM CHLORIDE 10 MEQ/100ML IV SOLN
10.0000 meq | INTRAVENOUS | Status: AC
  Administered 2015-11-27 (×4): 10 meq via INTRAVENOUS
  Filled 2015-11-27 (×4): qty 100

## 2015-11-27 MED ORDER — ENOXAPARIN SODIUM 80 MG/0.8ML ~~LOC~~ SOLN
75.0000 mg | SUBCUTANEOUS | Status: DC
  Administered 2015-11-27: 75 mg via SUBCUTANEOUS
  Filled 2015-11-27: qty 0.8

## 2015-11-27 MED ORDER — GI COCKTAIL ~~LOC~~
30.0000 mL | Freq: Two times a day (BID) | ORAL | Status: DC | PRN
  Administered 2015-11-27: 30 mL via ORAL
  Filled 2015-11-27 (×2): qty 30

## 2015-11-27 MED ORDER — ACETAMINOPHEN 325 MG PO TABS: 650.0000 mg | ORAL_TABLET | Freq: Four times a day (QID) | ORAL | Status: DC | PRN

## 2015-11-27 MED ORDER — LORATADINE 10 MG PO TABS: 10.0000 mg | ORAL_TABLET | Freq: Every day | ORAL | Status: DC

## 2015-11-27 MED ORDER — PREDNISONE 20 MG PO TABS: ORAL_TABLET | ORAL | 0 refills | Status: DC

## 2015-11-27 MED ORDER — IPRATROPIUM-ALBUTEROL 0.5-2.5 (3) MG/3ML IN SOLN: 3.0000 mL | RESPIRATORY_TRACT | Status: DC

## 2015-11-27 MED ORDER — IPRATROPIUM-ALBUTEROL 0.5-2.5 (3) MG/3ML IN SOLN
3.0000 mL | Freq: Four times a day (QID) | RESPIRATORY_TRACT | Status: DC
Start: 2015-11-27 — End: 2015-11-27
  Administered 2015-11-27 (×2): 3 mL via RESPIRATORY_TRACT
  Filled 2015-11-27: qty 3

## 2015-11-27 MED ORDER — NICOTINE 14 MG/24HR TD PT24
14.0000 mg | MEDICATED_PATCH | Freq: Every day | TRANSDERMAL | Status: DC
  Administered 2015-11-27 (×2): 14 mg via TRANSDERMAL
  Filled 2015-11-27 (×2): qty 1

## 2015-11-27 NOTE — Discharge Summary (Signed)
Name: April [name] MRN: [id number] DOB: December 20, 1970 45 y.o. PCP: April Lever, MD  Date of Admission: 11/26/2015  8:19 PM Date of Discharge: 11/27/2015 Attending Physician: April Contes, MD  Discharge Diagnosis: 1. Asthma exacerbation  Active Problems:   Asthma exacerbation  Discharge Medications:   Medication List    STOP taking these medications   pantoprazole 40 MG tablet Commonly known as:  PROTONIX     TAKE these medications   acetaminophen 500 MG tablet Commonly known as:  TYLENOL Take 2 tablets (1,000 mg total) by mouth every 6 (six) hours as needed. What changed:  reasons to take this   albuterol (2.5 MG/3ML) 0.083% nebulizer solution Commonly known as:  PROVENTIL Take 3 mLs (2.5 mg total) by nebulization every 6 (six) hours as needed for wheezing or shortness of breath. What changed:  when to take this   albuterol 108 (90 Base) MCG/ACT inhaler Commonly known as:  PROVENTIL HFA;VENTOLIN HFA Inhale 1-2 puffs into the lungs every 4 (four) hours as needed for wheezing or shortness of breath. What changed:  Another medication with the same name was changed. Make sure you understand how and when to take each.   esomeprazole 40 MG capsule Commonly known as:  NEXIUM Take 40 mg by mouth daily.   FLONASE 50 MCG/ACT nasal spray Generic drug:  fluticasone Place 2 sprays into both nostrils daily.   hydrochlorothiazide 25 MG tablet Commonly known as:  HYDRODIURIL Take 1 tablet (25 mg total) by mouth daily.   ibuprofen 200 MG tablet Commonly known as:  ADVIL,MOTRIN Take 800 mg by mouth 2 (two) times daily as needed for moderate pain (knee pain).   ipratropium 0.02 % nebulizer solution Commonly known as:  ATROVENT Take 2.5 mLs (0.5 mg total) by nebulization 4 (four) times daily as needed for wheezing or shortness of breath.   loratadine 10 MG tablet Commonly known as:  CLARITIN Take 1 tablet (10 mg total) by mouth daily.   mometasone-formoterol 200-5  MCG/ACT Aero Commonly known as:  DULERA Inhale 2 puffs into the lungs 2 (two) times daily.   nicotine 14 mg/24hr patch Commonly known as:  NICODERM CQ - dosed in mg/24 hours Place 14 mg onto the skin 3 (three) times a week.   omalizumab 150 MG injection Commonly known as:  XOLAIR Inject 150 mg into the skin every 14 (fourteen) days.   predniSONE 20 MG tablet Commonly known as:  DELTASONE Take 60 mg (6 tabs) for 7 days, 30 mg (3 tabs) for 3 days, 10 mg (1 tab) for 3 days, and 5 mg (1/2 tab) for 3 days, then stop. What changed:  how much to take  how to take this  when to take this  additional instructions   sertraline 50 MG tablet Commonly known as:  ZOLOFT Take 1 tablet (50 mg total) by mouth daily.   STOOL SOFTENER 100 MG capsule Generic drug:  docusate sodium Take 100 mg by mouth 2 (two) times daily as needed for mild constipation.   theophylline 300 MG 12 hr tablet Commonly known as:  THEODUR Take 1 tablet (300 mg total) by mouth daily.       Disposition and follow-up:   [name] was discharged from [facility] in Stable condition.  At the hospital follow up visit please address:  1. Severe persistent asthma - poorly controlled, numerous recent ED visits and admissions, missed last two doses of Xolair due to transportation barrier, also cannot afford  her Ruthe Mannan (takes hospital sample during admissions) or Albuterol (gets Rx from sister)  - GERD - endorsed significant reflux symptoms during hospital stay despite Protonix BID, may require H2 blocker as well, this could improve her asthma  - Hypokalemia - 2.6 on admission s/p numerous albuterol nebulizer treatments at home, repleted with IV KCl and normalized to 3.8 by discharge  2.  Labs / imaging needed at time of follow-up: BMP  3.  Pending labs/ test needing follow-up: None  Follow-up Appointments: Follow-up Information    [employer]. Go on 12/08/2015.   Why:   Hospital follow up at 1:15 PM Contact information: [address] [place] [address]. Schedule an appointment as soon as possible for a visit today.   Contact information: [place] [phone] [place] Hospital Course by problem list: Active Problems:   Asthma exacerbation   1. Asthma exacerbation - presented on 7/30 with progressive SOB, wheezing not controlled on home meds since 7/25 (exposed to excess dust during recent move/cleaning), visited urgent care on 7/27 and received nebs, solumedrol and prednisone but this did not control her symptoms for long. Also missed two doses Xolair in last month. Placed on Duonebs, Albuterol, Prednisone, continued home LABA, inhaled corticosteroid, and theophylline controller meds. Symptoms and wheezing dramatically improved the following day (7/31), tolerated spacing duonebs and stable for discharge with [facility] follow up.  2. GERD - developed worsening reflux symptoms on 7/31 despite Protonix, resolved with GI cocktail  3. Hypokalemia - presented with K 2.6 on admission, resolved with IV KCL  Discharge Vitals:   BP (!) 174/95 (BP Location: Left Arm)   Pulse 86   Temp 97.8 F (36.6 C) (Oral)   Resp 16   Ht 5\' 5"  (1.651 m)   Wt (!) 153.1 kg (337 lb 8.4 oz)   SpO2 97%   BMI 56.17 kg/m   Pertinent Labs, Studies, and Procedures:  CBC Latest Ref Rng & Units 11/27/2015 11/26/2015 11/11/2015  WBC 4.0 - 10.5 K/uL 14.9(H) 15.3(H) 22.2(H)  Hemoglobin 12.0 - 15.0 g/dL 11.1(L) 11.6(L) 11.9(L)  Hematocrit 36.0 - 46.0 % 36.2 37.5 37.4  Platelets 150 - 400 K/uL 377 409(H) 394   BMP Latest Ref Rng & Units 11/27/2015 11/26/2015 11/11/2015  Glucose 65 - 99 mg/dL 187(H) 110(H) 128(H)  BUN 6 - 20 mg/dL <5(L) 6 8  Creatinine 0.44 - 1.00 mg/dL 0.79 0.76 0.51  Sodium 135 - 145 mmol/L 136 138 136  Potassium 3.5 - 5.1 mmol/L 3.8 2.6(LL)  4.1  Chloride 101 - 111 mmol/L 103 104 102  CO2 22 - 32 mmol/L 22 26 27   Calcium 8.9 - 10.3 mg/dL 7.8(L) 8.1(L) 8.8(L)   Dg Chest 2 View  Result Date: 11/26/2015 CLINICAL DATA:  Asthma exacerbation. EXAM: CHEST  2 VIEW COMPARISON:  Radiographs 11/10/2015, most recent chest CT 08/23/2015 FINDINGS: Chronic bronchial thickening and borderline hyperinflation. No focal airspace disease. Heart size and mediastinal contours are unchanged. No pleural effusion or pneumothorax. Unchanged osseous structures. IMPRESSION: Chronic bronchial thickening.  No new abnormality. Electronically Signed   By: Jeb Levering M.D.   On: 11/26/2015 20:04  Discharge Instructions: Discharge Instructions    Call MD for:  difficulty breathing, headache or visual disturbances    Complete by:  As directed   Call MD for:  persistant dizziness or light-headedness  Complete by:  As directed   Call MD for:  persistant nausea and vomiting    Complete by:  As directed   Call MD for:  severe uncontrolled pain    Complete by:  As directed   Diet - low sodium heart healthy    Complete by:  As directed   Discharge instructions    Complete by:  As directed   Please take all your medications, inhalers, and nebulizers as prescribed and attend your hospital follow up appointment. Please minimize you exposure to allergens (pollen, dust, wear mask when cleaning), and take you allergy medications. We have prescribed a prednisone taper - take 60 mg (6 tabs) for 7 days, 30 mg (3 tabs) for 3 days, 10 mg (1 tab) for 3 days, and 5 mg (1/2 tab) for 3 days, then stop.  If you develop worsening shortness of breath or pain that is not improving with your home medications, please call us or visit the ER.   If you have any questions or concerns, please call the clinic at [phone] or if it is the weekend or after hours, you may call [phone] and ask for the internal medicine resident on call.   Increase activity slowly    Complete by:  As  directed     Signed: Asencion Partridge, MD 11/27/2015, 7:52 PM   Pager: [phone]

## 2015-11-27 NOTE — Progress Notes (Signed)
Discharge instructions reviewed with pt and instructed pt on where to pick up prescriptions.  Pt verbalized understanding and had no questions.  Pt discharged in stable condition.  April [name]

## 2015-11-27 NOTE — Progress Notes (Signed)
-  Subjective: April [name] is feeling okay this morning. She says her breathing is better than yesterday but still not quite at baseline. She states that she takes her Theophylline as prescribed but missed her last two Xolair injections due to transportation difficulties, along with the recent trigger of kicking up excess dust while cleaning at home last friday (did not wear any mask). She states that she has all her nebulizer supplies at home along with spacers for her inhalers, which she uses regularly. Her Dulera (Mometasone-formoterol) did run out the day before admission though.   Per RN complaining of GERD symptoms this AM despite Protonix, given PRN GI cocktail  Objective: Vital signs in last 24 hours: Vitals:   11/27/15 0110 11/27/15 0219 11/27/15 0424 11/27/15 0500  BP: (!) 172/102   (!) 173/89  Pulse: 98   84  Resp: (!) 22   19  Temp: 99.2 F (37.3 C)   99 F (37.2 C)  TempSrc:    Oral  SpO2: 96% 100% 97% 98%  Weight:      Height:        Intake/Output Summary (Last 24 hours) at 11/27/15 1038 Last data filed at 11/27/15 [id number]  Gross per 24 hour  Intake              240 ml  Output                0 ml  Net              240 ml    Physical Exam General appearance: Morbidly obese female lying in bed in no apparent distress, mild wheeze during conversation Cardiovascular: Regular rate and rhythm, no murmurs, rubs, gallops, no peripheral edema Respiratory/Chest: Distant breath sounds 2/2 body habitus, no wheezing on auscultation, normal work of breathing Abdomen: Bowel sounds present, soft, non-tender, non-distended Skin: Warm, dry, intact Neuro: Cranial nerves grossly intact, alert and oriented Psych: Appropriate affect  Labs / Imaging / Procedures: CBC Latest Ref Rng & Units 11/27/2015 11/26/2015 11/11/2015  WBC 4.0 - 10.5 K/uL 14.9(H) 15.3(H) 22.2(H)  Hemoglobin 12.0 - 15.0 g/dL 11.1(L) 11.6(L) 11.9(L)  Hematocrit 36.0 - 46.0 % 36.2 37.5 37.4  Platelets 150 - 400 K/uL 377  409(H) 394   BMP Latest Ref Rng & Units 11/27/2015 11/26/2015 11/11/2015  Glucose 65 - 99 mg/dL 187(H) 110(H) 128(H)  BUN 6 - 20 mg/dL <5(L) 6 8  Creatinine 0.44 - 1.00 mg/dL 0.79 0.76 0.51  Sodium 135 - 145 mmol/L 136 138 136  Potassium 3.5 - 5.1 mmol/L 3.8 2.6(LL) 4.1  Chloride 101 - 111 mmol/L 103 104 102  CO2 22 - 32 mmol/L 22 26 27   Calcium 8.9 - 10.3 mg/dL 7.8(L) 8.1(L) 8.8(L)   Dg Chest 2 View  Result Date: 11/26/2015 CLINICAL DATA:  Asthma exacerbation. EXAM: CHEST  2 VIEW COMPARISON:  Radiographs 11/10/2015, most recent chest CT 08/23/2015 FINDINGS: Chronic bronchial thickening and borderline hyperinflation. No focal airspace disease. Heart size and mediastinal contours are unchanged. No pleural effusion or pneumothorax. Unchanged osseous structures. IMPRESSION: Chronic bronchial thickening.  No new abnormality. Electronically Signed   By: Jeb Levering M.D.   On: 11/26/2015 20:04  Assessment/Plan: April [name] is a 45 y.o. woman with PMH severe persistent asthma with multiple hospitalizations admitted for asthma exacerbation.  Asthma exacerbation - could not be controlled despite solumedrol, po prednisone in addition to home nebulizers. Poorly controlled asthma, frequent admissions for same, numerous social/transportation barriers to treatment (e.g. Missed  last two doses Xolair 2/2 no transport), s/p Mg, Solumedrol in ED  - Duonebs spaced to Q8 PRN (from Q4 on admission)  - Continue LABA, inhaled corticosteroid, theophylline controller meds  - Home loratadine  - Prednisone 60 mg, slow taper on discharge  - Arrange pulmonary f/up  - Refill Dulera at discharge  Hypokalemia, 2/2 albuterol treatment, resolved after IV K repletion  - Replete as needed  - Daily BMP  GERD  - Protonix BID  - Added GI cocktail BID PRN  Dispo: Anticipated discharge in approximately 1-2 day(s).   LOS: 0 days   Asencion Partridge, MD 11/27/2015, 10:38 AM Pager: [phone]

## 2015-11-27 NOTE — Care Management Note (Signed)
Case Management Note  Patient Details  Name: April [name] MRN: [id number] Date of Birth: 11-12-1970  Subjective/Objective:                    Action/Plan:   Expected Discharge Date:  11/28/15               Expected Discharge Plan:  Home/Self Care  In-House Referral:     Discharge planning Services     Post Acute Care Choice:    Choice offered to:     DME Arranged:    DME Agency:     HH Arranged:    [place] Agency:     Status of Service:  Completed, signed off  If discussed at [employer] of Stay Meetings, dates discussed:    Additional Comments: Patient non eligible for [place] letter ( used 08-24-15 to 09-01-15  Marilu Favre, RN 11/27/2015, 3:16 PM

## 2015-11-28 ENCOUNTER — Telehealth: Payer: Self-pay | Admitting: Internal Medicine

## 2015-11-28 ENCOUNTER — Other Ambulatory Visit: Payer: Self-pay | Admitting: Internal Medicine

## 2015-11-28 ENCOUNTER — Ambulatory Visit (INDEPENDENT_AMBULATORY_CARE_PROVIDER_SITE_OTHER): Payer: Self-pay

## 2015-11-28 DIAGNOSIS — J4540 Moderate persistent asthma, uncomplicated: Secondary | ICD-10-CM

## 2015-11-28 DIAGNOSIS — J45909 Unspecified asthma, uncomplicated: Secondary | ICD-10-CM

## 2015-11-28 MED ORDER — OMALIZUMAB 150 MG ~~LOC~~ SOLR
225.0000 mg | SUBCUTANEOUS | Status: DC
  Administered 2015-11-28: 225 mg via SUBCUTANEOUS

## 2015-11-28 MED FILL — predniSONE 20 MG TABS: 20 | 15 days supply | Qty: 40 | Fill #0

## 2015-11-28 MED FILL — THEOPHYLLINE ER 300 MG TAB: 300 | 30 days supply | Qty: 30 | Fill #0

## 2015-11-28 NOTE — Telephone Encounter (Signed)
Went to lobby to speak with patient as we do not have samples of rescue inhalers here in the office.  Pt then asked for samples of her Dulera 200.  Checked, we do not have samples of this strength.  Pt okay with this and asked that we send refills on the [facility] to [place] - advised will do so.  Upon inspection of patient's chart, the [facility] was refilled yesterday by Dr Asencion Partridge Will sign off

## 2015-11-29 MED FILL — !VENTOLIN HFA INHALER: 108 (90 BAS | 16 days supply | Qty: 18 | Fill #0

## 2015-12-07 IMAGING — CR DG CHEST 2V
2 series · 2 of 2 positions shown · non-contrast
Comparison: 11/03/2014

CLINICAL DATA: Shortness of breath for 1 day.  History of asthma.

EXAM:
CHEST  2 VIEW

[chest pa]
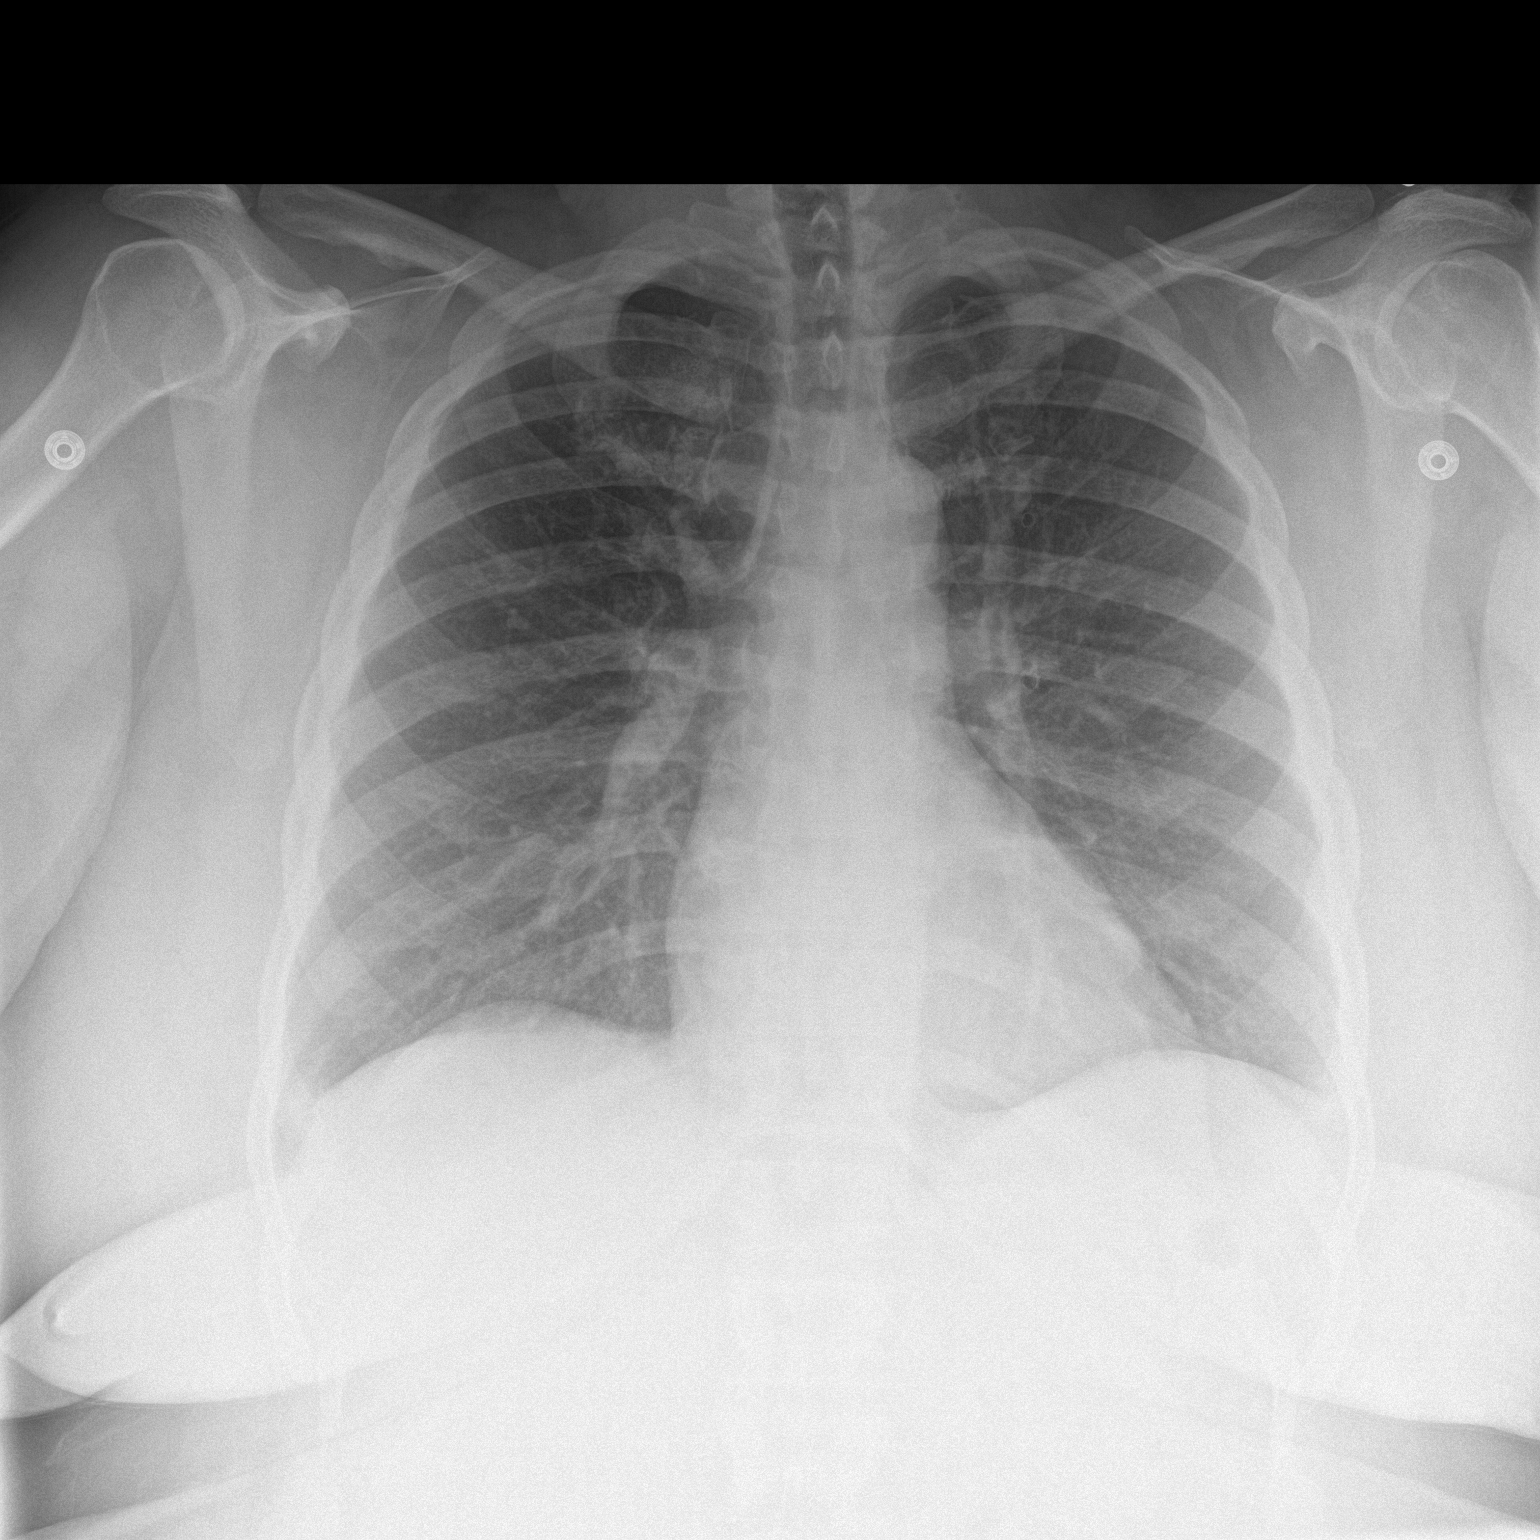

[chest lat]
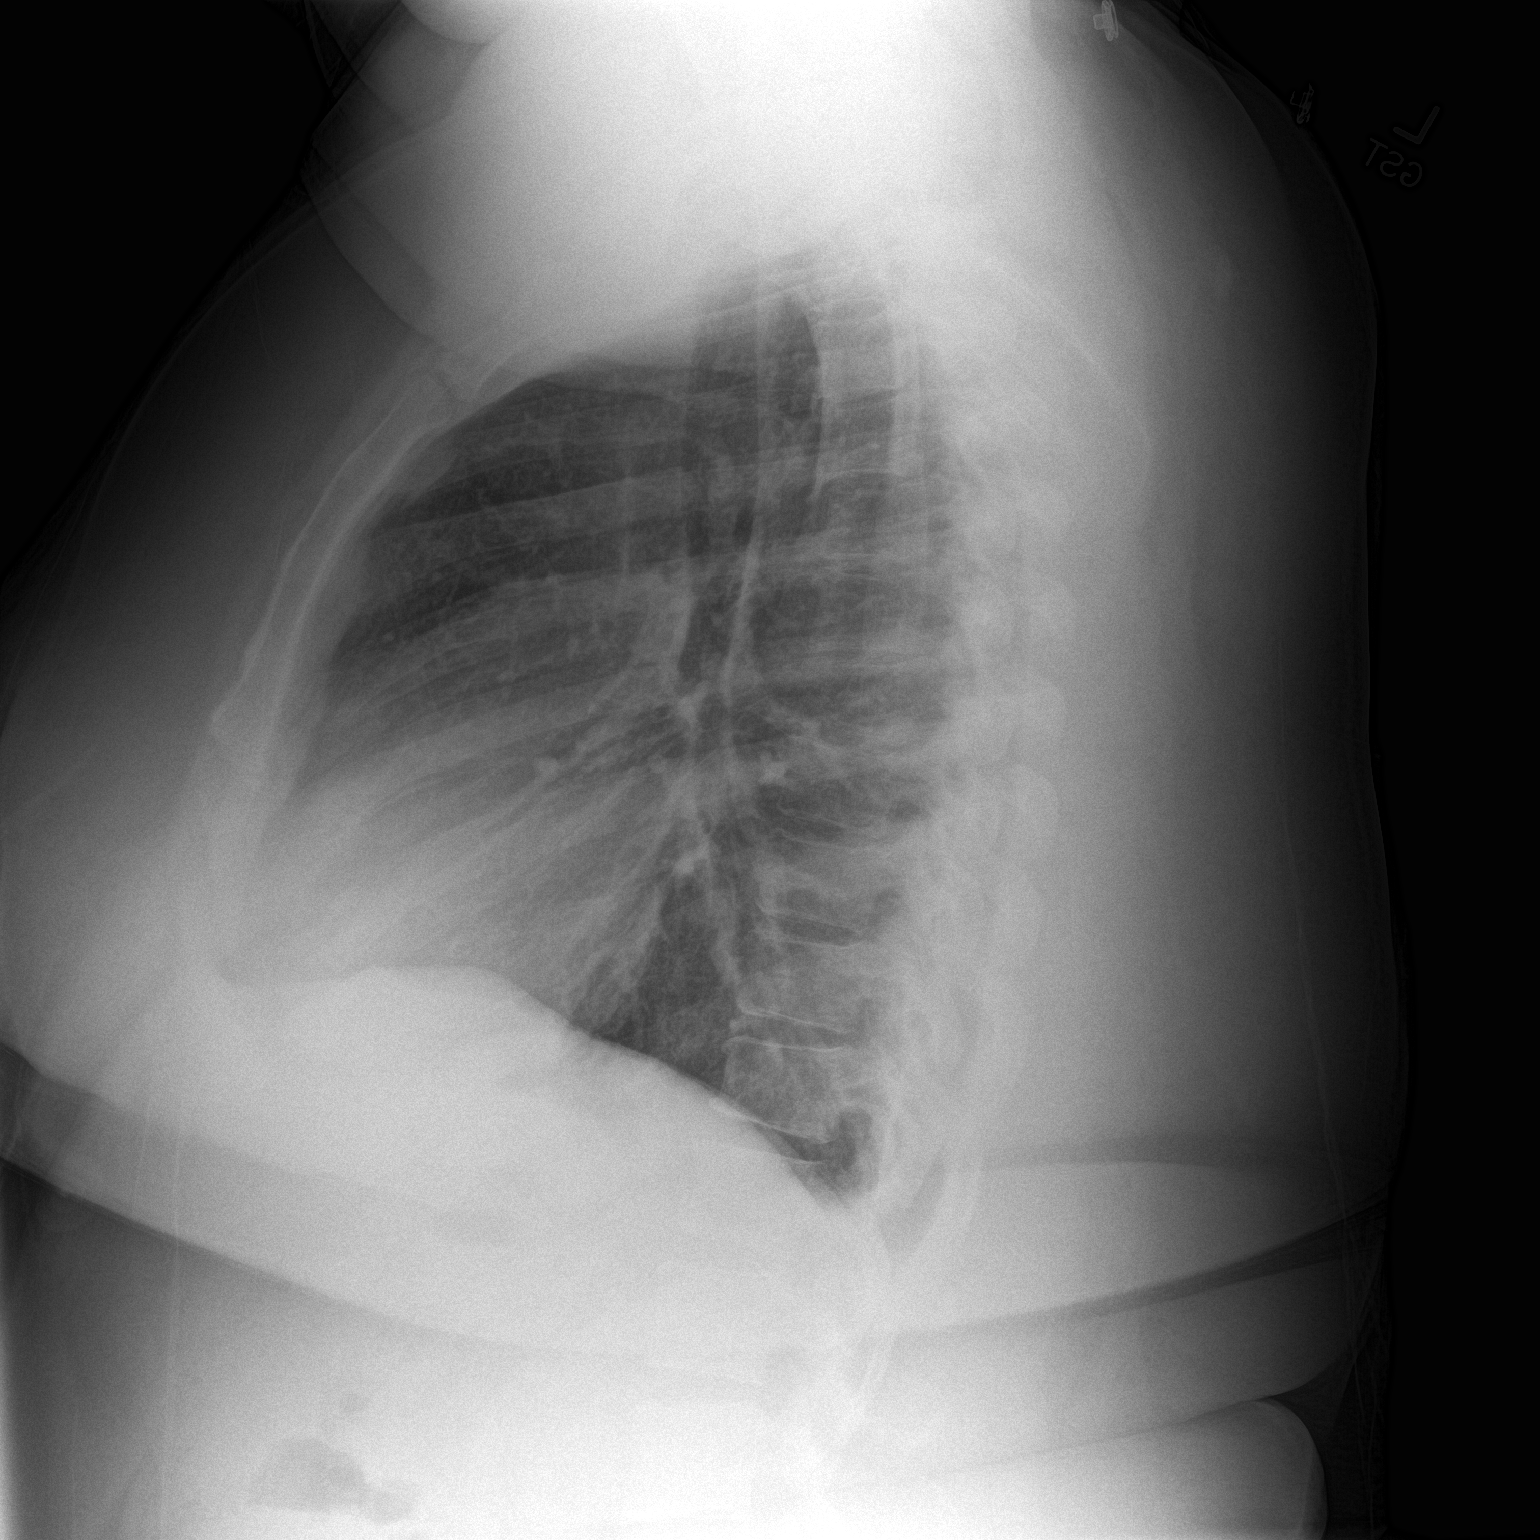

[2 of 2 positions shown; findings below may reference images not displayed]

FINDINGS: The cardiomediastinal contours are normal. Mild central bronchitic
markings. Pulmonary vasculature is normal. No consolidation, pleural
effusion, or pneumothorax. No acute osseous abnormalities are seen.
IMPRESSION: Mild chronic bronchitic change.  No superimposed acute process.

## 2015-12-08 ENCOUNTER — Ambulatory Visit: Payer: Self-pay

## 2015-12-11 ENCOUNTER — Other Ambulatory Visit: Payer: Self-pay | Admitting: Family Medicine

## 2015-12-11 ENCOUNTER — Other Ambulatory Visit: Payer: Self-pay | Admitting: Internal Medicine

## 2015-12-11 NOTE — Telephone Encounter (Signed)
Looks like it was last filled to taper.

## 2015-12-11 NOTE — Telephone Encounter (Signed)
Refill denied. A Ms Theda Sers removed [place] as PCP and entered Dr Annamaria Boots as PCP in May. Forwarding to Dr Annamaria Boots to address refill.

## 2015-12-12 MED FILL — predniSONE 20 MG TABS: 20 | 7 days supply | Qty: 40 | Fill #0

## 2015-12-12 NOTE — Telephone Encounter (Signed)
I am a pulmonologist and not appropriately listed as PCP for [facility]. This role is better served by Dr Darene Lamer. I will refill this time the prednisone requested by April [name], seeking to keep her out of the ER.

## 2015-12-14 ENCOUNTER — Ambulatory Visit (INDEPENDENT_AMBULATORY_CARE_PROVIDER_SITE_OTHER): Payer: Self-pay

## 2015-12-14 ENCOUNTER — Telehealth: Payer: Self-pay | Admitting: Internal Medicine

## 2015-12-14 DIAGNOSIS — J4540 Moderate persistent asthma, uncomplicated: Secondary | ICD-10-CM

## 2015-12-14 MED ORDER — OMALIZUMAB 150 MG ~~LOC~~ SOLR
225.0000 mg | SUBCUTANEOUS | Status: DC
  Administered 2015-12-14: 225 mg via SUBCUTANEOUS

## 2015-12-14 MED ORDER — MOMETASONE FURO-FORMOTEROL FUM 200-5 MCG/ACT IN AERO: 2.0000 | INHALATION_SPRAY | Freq: Two times a day (BID) | RESPIRATORY_TRACT | 0 refills | Status: DC

## 2015-12-14 NOTE — Telephone Encounter (Signed)
Spoke with pt. She is requesting samples of Dulera and Proair. Advised her that we do not have samples of Proair at this time. Samples of Dulera have been given to the pt. Nothing further was needed.

## 2015-12-14 NOTE — Telephone Encounter (Signed)
I am not Ms Ramaker's PCP nor have I ever been her PCP and am now inappropriately listed as her PCP. Doris - would you pls correct her PCP and educate Ms Theda Sers that she should NOT be changing PCP? Inappropriate PCP changing is on yet another upswing. Any chance of hospital wide re-education so that pt is not compromised?

## 2015-12-14 NOTE — Telephone Encounter (Signed)
Noted  

## 2015-12-18 ENCOUNTER — Emergency Department (HOSPITAL_COMMUNITY): Payer: Self-pay

## 2015-12-18 ENCOUNTER — Telehealth: Payer: Self-pay | Admitting: Internal Medicine

## 2015-12-18 ENCOUNTER — Emergency Department (HOSPITAL_COMMUNITY)
Admission: EM | Admit: 2015-12-18 | Discharge: 2015-12-18 | Disposition: A | Discharge status: Home / Self Care | Payer: Self-pay | Attending: Emergency Medicine | Admitting: Emergency Medicine

## 2015-12-18 ENCOUNTER — Emergency Department (HOSPITAL_COMMUNITY)
Admission: EM | Admit: 2015-12-18 | Discharge: 2015-12-18 | Disposition: A | Discharge status: Home / Self Care | Payer: Self-pay

## 2015-12-18 ENCOUNTER — Encounter (HOSPITAL_COMMUNITY): Payer: Self-pay | Admitting: Emergency Medicine

## 2015-12-18 DIAGNOSIS — D72829 Elevated white blood cell count, unspecified: Secondary | ICD-10-CM | POA: Insufficient documentation

## 2015-12-18 DIAGNOSIS — Z87891 Personal history of nicotine dependence: Secondary | ICD-10-CM | POA: Insufficient documentation

## 2015-12-18 DIAGNOSIS — I10 Essential (primary) hypertension: Secondary | ICD-10-CM | POA: Insufficient documentation

## 2015-12-18 DIAGNOSIS — Z79899 Other long term (current) drug therapy: Secondary | ICD-10-CM | POA: Insufficient documentation

## 2015-12-18 DIAGNOSIS — J45901 Unspecified asthma with (acute) exacerbation: Secondary | ICD-10-CM | POA: Insufficient documentation

## 2015-12-18 LAB — CBC WITH DIFFERENTIAL/PLATELET
BASOS PCT: 0 %
Basophils Absolute: 0 10*3/uL (ref 0.0–0.1)
EOS ABS: 0 10*3/uL (ref 0.0–0.7)
EOS PCT: 0 %
HEMATOCRIT: 36.6 % (ref 36.0–46.0)
Hemoglobin: 11.1 g/dL — ABNORMAL LOW (ref 12.0–15.0)
LYMPHS ABS: 0.8 10*3/uL (ref 0.7–4.0)
Lymphocytes Relative: 5 %
MCH: 27.1 pg (ref 26.0–34.0)
MCHC: 30.3 g/dL (ref 30.0–36.0)
MCV: 89.5 fL (ref 78.0–100.0)
MONOS PCT: 7 %
Monocytes Absolute: 1.3 10*3/uL — ABNORMAL HIGH (ref 0.1–1.0)
NEUTROS PCT: 88 %
Neutro Abs: 16.3 10*3/uL — ABNORMAL HIGH (ref 1.7–7.7)
Platelets: 379 10*3/uL (ref 150–400)
RBC: 4.09 MIL/uL (ref 3.87–5.11)
RDW: 17.4 % — AB (ref 11.5–15.5)
WBC: 18.4 10*3/uL — ABNORMAL HIGH (ref 4.0–10.5)

## 2015-12-18 LAB — BASIC METABOLIC PANEL
ANION GAP: 11 (ref 5–15)
BUN: 11 mg/dL (ref 6–20)
CALCIUM: 8.9 mg/dL (ref 8.9–10.3)
CHLORIDE: 105 mmol/L (ref 101–111)
CO2: 24 mmol/L (ref 22–32)
Creatinine, Ser: 0.81 mg/dL (ref 0.44–1.00)
GFR calc Af Amer: >60 mL/min (ref >60)
GFR calc non Af Amer: >60 mL/min (ref >60)
GLUCOSE: 143 mg/dL — AB (ref 65–99)
Potassium: 3.1 mmol/L — ABNORMAL LOW (ref 3.5–5.1)
Sodium: 140 mmol/L (ref 135–145)

## 2015-12-18 LAB — I-STAT BETA HCG BLOOD, ED (MC, WL, AP ONLY): I-stat hCG, quantitative: <5 m[IU]/mL (ref <5)

## 2015-12-18 LAB — BRAIN NATRIURETIC PEPTIDE: B Natriuretic Peptide: 46.1 pg/mL (ref 0.0–100.0)

## 2015-12-18 MED ORDER — ALBUTEROL SULFATE HFA 108 (90 BASE) MCG/ACT IN AERS
1.0000 | INHALATION_SPRAY | Freq: Once | RESPIRATORY_TRACT | Status: AC
  Administered 2015-12-18: 2 via RESPIRATORY_TRACT
  Filled 2015-12-18: qty 6.7

## 2015-12-18 MED ORDER — MAGNESIUM SULFATE 2 GM/50ML IV SOLN
2.0000 g | Freq: Once | INTRAVENOUS | Status: AC
  Administered 2015-12-18: 2 g via INTRAVENOUS
  Filled 2015-12-18: qty 50

## 2015-12-18 MED ORDER — ALBUTEROL (5 MG/ML) CONTINUOUS INHALATION SOLN
10.0000 mg/h | INHALATION_SOLUTION | RESPIRATORY_TRACT | Status: AC
  Administered 2015-12-18: 10 mg/h via RESPIRATORY_TRACT
  Filled 2015-12-18: qty 20

## 2015-12-18 MED ORDER — POTASSIUM CHLORIDE CRYS ER 20 MEQ PO TBCR
40.0000 meq | EXTENDED_RELEASE_TABLET | Freq: Once | ORAL | Status: AC
  Administered 2015-12-18: 40 meq via ORAL
  Filled 2015-12-18: qty 2

## 2015-12-18 MED ORDER — IPRATROPIUM BROMIDE 0.02 % IN SOLN
1.0000 mg | Freq: Once | RESPIRATORY_TRACT | Status: AC
  Administered 2015-12-18: 1 mg via RESPIRATORY_TRACT
  Filled 2015-12-18: qty 5

## 2015-12-18 NOTE — Telephone Encounter (Addendum)
#   vials:4 Ordered date:12/18/15 Shipping Date:12/21/15

## 2015-12-18 NOTE — ED Notes (Signed)
Pt refusing to wear the bp cuff at this time. Pt states "it's too hot to wear that right now."  Will continue to closely monitor pt.  Pt's magnesium infusion continues to infuse via the infusion pump at this time.  Pt remains on the cardiac monitor and continuous pulse oximeter at this time.

## 2015-12-18 NOTE — ED Provider Notes (Signed)
[facility] DEPT Provider Note   CSN: [id number] Arrival date & time: 12/18/15  0444     History   Chief Complaint Chief Complaint  Patient presents with  . Shortness of Breath    HPI April [name] is a 45 y.o. female.  April [name] is a 45 y.o. female  with a hx of asthma, obesity, GERD, HTN presents to the Emergency Department complaining of gradual, persistent, progressively worsening SOB onset 4 hours PTA.  Pt reports she's had a productive cough for several days. She reports she frequently has asthma exacerbations requiring admission.  She states that she always requires magnesium and wishes that this be administered early. She states she currently does not want to be admitted.  Patient reports her last exacerbation was approximately 2 weeks ago and she did take steroids for this. Patient also reports swelling of her bilateral feet.  He denies chest pain. Patient denies previous cardiac history or history of CHF.  Patient has used her home inhaler without relief. Nothing seems to make the symptoms better. Exertion makes her wheezing worse. She denies fever, chills, nausea, vomiting, diarrhea, syncope, dysuria.  No recent travel or hx of blood clots.     The history is provided by the patient and medical records. No language interpreter was used.    Past Medical History:  Diagnosis Date  . Acanthosis nigricans   . Arthritis   . Asthma    Followed by Dr. Annamaria Boots (pulmonology); receives every other week omalizumab injections; has frequent exacerbations  . COPD (chronic obstructive pulmonary disease) ([place])    PFTs in 2002, FEV1/FVC 65, no post bronchodilater test done  . Depression   . GERD (gastroesophageal reflux disease)   . Headache(784.0)   . Helicobacter pylori (H. pylori) infection   . Hypertension   . Hypertension, essential   . Insomnia   . Menorrhagia   . Morbid obesity ([place])   . Obesity   . Seasonal allergies   . Shortness of breath   . Sleep apnea    Sleep  study 2008 - mild OSA, not enough events to titrate CPAP  . Tobacco user     Patient Active Problem List   Diagnosis Date Noted  . Asthma exacerbation 11/10/2015  . GERD (gastroesophageal reflux disease) 08/30/2015  . Generalized anxiety disorder 08/30/2015  . Seasonal allergic rhinitis 08/29/2013  . Acute asthma exacerbation 05/22/2012  . Asthma, chronic obstructive, without status asthmaticus ([place]) 05/07/2012  . Knee pain, bilateral 04/25/2011  . Obstructive sleep apnea 12/19/2010  . Cervical back pain with evidence of disc disease 04/08/2008  . Essential hypertension 07/31/2006  . Morbid obesity with body mass index of 50.0-59.9 in adult ([place]) 06/17/2006  . Major depressive disorder, recurrent episode ([place]) 04/10/2006    Past Surgical History:  Procedure Laterality Date  . BREAST REDUCTION SURGERY  09/2011  . TUBAL LIGATION  1996   bilateral    OB History    No data available       Home Medications    Prior to Admission medications   Medication Sig Start Date End Date Taking? Authorizing Provider  acetaminophen (TYLENOL) 500 MG tablet Take 2 tablets (1,000 mg total) by mouth every 6 (six) hours as needed. Patient taking differently: Take 1,000 mg by mouth every 6 (six) hours as needed for mild pain.  08/30/15   [name], MD  albuterol (PROVENTIL) (2.5 MG/3ML) 0.083% nebulizer solution Take 3 mLs (2.5 mg total) by nebulization every 6 (six) hours as  needed for wheezing or shortness of breath. Patient taking differently: Take 2.5 mg by nebulization every 2 (two) hours as needed for wheezing or shortness of breath.  08/30/15   [name], MD  docusate sodium (STOOL SOFTENER) 100 MG capsule Take 100 mg by mouth 2 (two) times daily as needed for mild constipation.  10/24/10   Historical Provider, MD  esomeprazole (NEXIUM) 40 MG capsule Take 40 mg by mouth daily. 08/30/15   Historical Provider, MD  fluticasone (FLONASE) 50 MCG/ACT nasal spray Place 2 sprays into both nostrils  daily. 09/27/10   Historical Provider, MD  hydrochlorothiazide (HYDRODIURIL) 25 MG tablet Take 1 tablet (25 mg total) by mouth daily. 08/30/15   [name], MD  ibuprofen (ADVIL,MOTRIN) 200 MG tablet Take 800 mg by mouth 2 (two) times daily as needed for moderate pain (knee pain).    Historical Provider, MD  ipratropium (ATROVENT) 0.02 % nebulizer solution Take 2.5 mLs (0.5 mg total) by nebulization 4 (four) times daily as needed for wheezing or shortness of breath. 08/30/15   [name], MD  loratadine (CLARITIN) 10 MG tablet Take 1 tablet (10 mg total) by mouth daily. 08/30/15   [name], MD  mometasone-formoterol [facility]) 200-5 MCG/ACT AERO Inhale 2 puffs into the lungs 2 (two) times daily. 11/27/15   Asencion Partridge, MD  mometasone-formoterol [facility]) 200-5 MCG/ACT AERO Inhale 2 puffs into the lungs 2 (two) times daily. 12/14/15 12/15/15  Deneise Lever, MD  nicotine (NICODERM CQ - DOSED IN MG/24 HOURS) 14 mg/24hr patch Place 14 mg onto the skin 3 (three) times a week.    Historical Provider, MD  omalizumab Arvid Right) 150 MG injection Inject 150 mg into the skin every 14 (fourteen) days.    Historical Provider, MD  predniSONE (DELTASONE) 20 MG tablet Take 60 mg (6 tabs) for 7 days, 30 mg (3 tabs) for 3 days, 10 mg (1 tab) for 3 days, and 5 mg (1/2 tab) for 3 days, then stop. 11/27/15   Asencion Partridge, MD  predniSONE (DELTASONE) 20 MG tablet 2 tabs daily x 2 days, 1 tab daily x 2 days, then 1/2 tab daily 12/12/15   Deneise Lever, MD  sertraline (ZOLOFT) 50 MG tablet Take 1 tablet (50 mg total) by mouth daily. 08/30/15   [name], MD  theophylline (THEODUR) 300 MG 12 hr tablet Take 1 tablet (300 mg total) by mouth daily. 10/26/15   Burgess Estelle, MD  VENTOLIN HFA 108 (90 Base) MCG/ACT inhaler INHALE 1-2 PUFFS INTO THE LUNGS EVERY 4 HOURS. 11/28/15   Deneise Lever, MD    Family History Family History  Problem Relation Age of Onset  . Hypertension Mother   . Asthma Daughter   . Cancer Paternal Aunt   .  Asthma Maternal Grandmother     Social History Social History  Substance Use Topics  . Smoking status: Former Smoker    Years: 18.00    Types: Cigarettes    Quit date: 09/15/2014  . Smokeless tobacco: Former Systems developer    Quit date: 09/12/2014  . Alcohol use No     Allergies   Review of patient's allergies indicates no known allergies.   Review of Systems Review of Systems  Respiratory: Positive for cough, chest tightness, shortness of breath and wheezing.      Physical Exam Updated Vital Signs Temp 97.8 F (36.6 C) (Axillary)   Ht 5\' 5"  (1.651 m)   Wt (!) 145.2 kg   LMP 11/17/2015   SpO2 100%  BMI 53.25 kg/m   Physical Exam  Constitutional: She appears well-developed and well-nourished. No distress.  Awake, alert, nontoxic appearance  HENT:  Head: Normocephalic and atraumatic.  Mouth/Throat: Oropharynx is clear and moist. No oropharyngeal exudate.  Eyes: Conjunctivae are normal. No scleral icterus.  Neck: Normal range of motion. Neck supple.  Cardiovascular: Regular rhythm and intact distal pulses.  Tachycardia present.   Pulses:      Radial pulses are 2+ on the right side, and 2+ on the left side.       Dorsalis pedis pulses are 2+ on the right side, and 2+ on the left side.  Pulmonary/Chest: Accessory muscle usage present. Tachypnea noted. She is in respiratory distress. She has decreased breath sounds. She has wheezes ( Throughout). She has no rhonchi. She has no rales.  Equal chest expansion  Abdominal: Soft. Bowel sounds are normal. She exhibits no mass. There is no tenderness. There is no rebound and no guarding.  Musculoskeletal: Normal range of motion. She exhibits edema.  Mild, nonpitting edema of the bilateral forefoot, ankle and lower leg  Neurological: She is alert.  Speech is clear and goal oriented Moves extremities without ataxia  Skin: Skin is warm and dry. She is not diaphoretic.  Psychiatric: She has a normal mood and affect.  Nursing note and  vitals reviewed.    ED Treatments / Results  Labs (all labs ordered are listed, but only abnormal results are displayed) Labs Reviewed  CBC WITH DIFFERENTIAL/PLATELET - Abnormal; Notable for the following:       Result Value   WBC 18.4 (*)    Hemoglobin 11.1 (*)    RDW 17.4 (*)    Neutro Abs 16.3 (*)    Monocytes Absolute 1.3 (*)    All other components within normal limits  BASIC METABOLIC PANEL  BRAIN NATRIURETIC PEPTIDE  I-STAT BETA HCG BLOOD, ED ([facility], [facility], [facility] ONLY)    EKG  EKG Interpretation  Date/Time:  Monday December 18 2015 04:54:12 EDT Ventricular Rate:  110 PR Interval:    QRS Duration: 82 QT Interval:  348 QTC Calculation: 471 R Axis:   19 Text Interpretation:  Sinus tachycardia LAE, consider biatrial enlargement Borderline repolarization abnormality No significant change since last tracing Confirmed by HORTON  MD, COURTNEY ([id number]) on 12/18/2015 5:58:48 AM       Radiology Dg Chest 2 View  Result Date: 12/18/2015 CLINICAL DATA:  Shortness of breath EXAM: CHEST  2 VIEW COMPARISON:  Chest radiograph 11/26/2015 FINDINGS: Cardiomediastinal contours are normal. No pneumothorax or sizable pleural effusion. No focal airspace consolidation or pulmonary edema. IMPRESSION: Clear lungs. Electronically Signed   By: Ulyses Jarred M.D.   On: 12/18/2015 05:35    Procedures Procedures (including critical care time)  Medications Ordered in ED Medications  albuterol (PROVENTIL,VENTOLIN) solution continuous neb (10 mg/hr Nebulization New Bag/Given 12/18/15 0548)  magnesium sulfate IVPB 2 g 50 mL (2 g Intravenous New Bag/Given 12/18/15 0604)  ipratropium (ATROVENT) nebulizer solution 1 mg (1 mg Nebulization Given 12/18/15 0548)     Initial Impression / Assessment and Plan / ED Course  I have reviewed the triage vital signs and the nursing notes.  Pertinent labs & imaging results that were available during my care of the patient were reviewed by me and considered in my medical  decision making (see chart for details).  Clinical Course  Value Comment By Time  WBC: (!) 18.4 Elevated.  Likely 2/2 recent steroid usage. Jarrett Soho Zeriyah Wain, PA-C 08/21 [phone]  I-stat  hCG, quantitative: <5.0 Neg Abigail Butts, PA-C 08/21 0621  DG Chest 2 View No evidence of PNA or pulmonary edema. Abigail Butts, PA-C 08/21 [id number]  EKG 12-Lead Nonischemic Abigail Butts, PA-C 08/21 [id number]   At shift change, care transferred to Janetta Hora, PA-C who will follow labs, reassess and determine disposition.   Jarrett Soho Daisy Mcneel, PA-C 08/21 [phone]    Pt with asthma exacerbation.  She is often admitted, but currently states she wishes for treatment and discharge home.  Care transferred at shift change.    Final Clinical Impressions(s) / ED Diagnoses   Final diagnoses:  Asthma exacerbation  Leukocytosis    New Prescriptions New Prescriptions   No medications on file     Abigail Butts, PA-C 12/18/15 [id number]    Merryl Hacker, MD 12/19/15 [phone]

## 2015-12-18 NOTE — ED Notes (Signed)
Pt sleeping no c/o discomfort

## 2015-12-18 NOTE — ED Notes (Signed)
called for triage with no answer.  Nurse first and registration stated that they thought she left.

## 2015-12-18 NOTE — ED Notes (Signed)
Pt called for triage, no answer

## 2015-12-18 NOTE — ED Notes (Signed)
2nd call for triage with no answer.  Nurse first made aware

## 2015-12-18 NOTE — ED Triage Notes (Signed)
Brought by EMS from work.  Reports being SOB since around 2130 was trying to make it till she got off work.  Given solumedrol 125mg  IV and 2 breathing treatments enroute.  Also reports having a productive cough with yellow sputum since yesterday.  Also c/o sore throat that started Friday and swelling in ankles since Friday.

## 2015-12-18 NOTE — ED Provider Notes (Signed)
45 year old female well known to the ED presents with COPD/asthma exacerbation. Patient signed out to me by H. Muthersbaugh PA-C pending labs. On reexamination she reports feeling less SOB and wants to go home. Labs reviewed. She has mild hypokalemia most likely due to neb treatments. She also has marked leukocytosis however is on chronic steroids and this seems to be an ongoing problem. She is afebrile and non-toxic appearing. CXR is unremarkable. She is requesting a rescue inhaler since she is out. Will provide that and d/c. She has a follow up appt with PCP on Aug. 31.   Recardo Evangelist, PA-C 12/18/15 [address], PA-C 12/18/15 [id number]    Daleen Bo, MD 12/18/15 1739

## 2015-12-18 NOTE — Discharge Instructions (Signed)
Please follow up with your pulmonologist

## 2015-12-18 NOTE — ED Notes (Signed)
Pt called for triage, no response. 

## 2015-12-19 ENCOUNTER — Observation Stay (HOSPITAL_BASED_OUTPATIENT_CLINIC_OR_DEPARTMENT_OTHER): Payer: Self-pay

## 2015-12-19 ENCOUNTER — Encounter (HOSPITAL_COMMUNITY): Payer: Self-pay | Admitting: Emergency Medicine

## 2015-12-19 ENCOUNTER — Inpatient Hospital Stay (HOSPITAL_COMMUNITY)
Admission: EM | Admit: 2015-12-19 | Discharge: 2015-12-21 | DRG: 202 | Disposition: A | Discharge status: Home / Self Care | Payer: Self-pay | Attending: Internal Medicine | Admitting: Internal Medicine

## 2015-12-19 DIAGNOSIS — M546 Pain in thoracic spine: Secondary | ICD-10-CM

## 2015-12-19 DIAGNOSIS — F329 Major depressive disorder, single episode, unspecified: Secondary | ICD-10-CM | POA: Diagnosis present

## 2015-12-19 DIAGNOSIS — Z79899 Other long term (current) drug therapy: Secondary | ICD-10-CM

## 2015-12-19 DIAGNOSIS — Z87891 Personal history of nicotine dependence: Secondary | ICD-10-CM

## 2015-12-19 DIAGNOSIS — M7122 Synovial cyst of popliteal space [Baker], left knee: Secondary | ICD-10-CM | POA: Diagnosis present

## 2015-12-19 DIAGNOSIS — J302 Other seasonal allergic rhinitis: Secondary | ICD-10-CM

## 2015-12-19 DIAGNOSIS — J449 Chronic obstructive pulmonary disease, unspecified: Secondary | ICD-10-CM | POA: Diagnosis present

## 2015-12-19 DIAGNOSIS — M7989 Other specified soft tissue disorders: Secondary | ICD-10-CM

## 2015-12-19 DIAGNOSIS — Z7951 Long term (current) use of inhaled steroids: Secondary | ICD-10-CM

## 2015-12-19 DIAGNOSIS — K219 Gastro-esophageal reflux disease without esophagitis: Secondary | ICD-10-CM | POA: Diagnosis present

## 2015-12-19 DIAGNOSIS — J4551 Severe persistent asthma with (acute) exacerbation: Principal | ICD-10-CM | POA: Diagnosis present

## 2015-12-19 DIAGNOSIS — I10 Essential (primary) hypertension: Secondary | ICD-10-CM | POA: Diagnosis present

## 2015-12-19 DIAGNOSIS — J45901 Unspecified asthma with (acute) exacerbation: Secondary | ICD-10-CM

## 2015-12-19 DIAGNOSIS — Z6841 Body Mass Index (BMI) 40.0 and over, adult: Secondary | ICD-10-CM

## 2015-12-19 DIAGNOSIS — M545 Low back pain: Secondary | ICD-10-CM

## 2015-12-19 DIAGNOSIS — R51 Headache: Secondary | ICD-10-CM | POA: Diagnosis present

## 2015-12-19 DIAGNOSIS — G4733 Obstructive sleep apnea (adult) (pediatric): Secondary | ICD-10-CM | POA: Diagnosis present

## 2015-12-19 DIAGNOSIS — Z9114 Patient's other noncompliance with medication regimen: Secondary | ICD-10-CM

## 2015-12-19 DIAGNOSIS — E876 Hypokalemia: Secondary | ICD-10-CM | POA: Diagnosis present

## 2015-12-19 LAB — BASIC METABOLIC PANEL
ANION GAP: 9 (ref 5–15)
ANION GAP: 7 (ref 5–15)
BUN: 10 mg/dL (ref 6–20)
BUN: 8 mg/dL (ref 6–20)
CO2: 23 mmol/L (ref 22–32)
CO2: 27 mmol/L (ref 22–32)
Calcium: 8.5 mg/dL — ABNORMAL LOW (ref 8.9–10.3)
Calcium: 8.7 mg/dL — ABNORMAL LOW (ref 8.9–10.3)
Chloride: 108 mmol/L (ref 101–111)
Chloride: 106 mmol/L (ref 101–111)
Creatinine, Ser: 0.88 mg/dL (ref 0.44–1.00)
Creatinine, Ser: 0.82 mg/dL (ref 0.44–1.00)
GFR calc Af Amer: >60 mL/min (ref >60)
GLUCOSE: 157 mg/dL — AB (ref 65–99)
GLUCOSE: 164 mg/dL — AB (ref 65–99)
POTASSIUM: 3 mmol/L — AB (ref 3.5–5.1)
POTASSIUM: 3.5 mmol/L (ref 3.5–5.1)
Sodium: 140 mmol/L (ref 135–145)
Sodium: 140 mmol/L (ref 135–145)

## 2015-12-19 LAB — CBC WITH DIFFERENTIAL/PLATELET
BASOS ABS: 0 10*3/uL (ref 0.0–0.1)
BASOS PCT: 0 %
Eosinophils Absolute: 0 10*3/uL (ref 0.0–0.7)
Eosinophils Relative: 0 %
HEMATOCRIT: 35.2 % — AB (ref 36.0–46.0)
Hemoglobin: 10.7 g/dL — ABNORMAL LOW (ref 12.0–15.0)
LYMPHS PCT: 7 %
Lymphs Abs: 1.5 10*3/uL (ref 0.7–4.0)
MCH: 27.4 pg (ref 26.0–34.0)
MCHC: 30.4 g/dL (ref 30.0–36.0)
MCV: 90.3 fL (ref 78.0–100.0)
Monocytes Absolute: 1.3 10*3/uL — ABNORMAL HIGH (ref 0.1–1.0)
Monocytes Relative: 6 %
NEUTROS ABS: 17.8 10*3/uL — AB (ref 1.7–7.7)
Neutrophils Relative %: 87 %
PLATELETS: 333 10*3/uL (ref 150–400)
RBC: 3.9 MIL/uL (ref 3.87–5.11)
RDW: 17.6 % — ABNORMAL HIGH (ref 11.5–15.5)
WBC: 20.7 10*3/uL — AB (ref 4.0–10.5)

## 2015-12-19 LAB — MAGNESIUM: Magnesium: 2.4 mg/dL (ref 1.7–2.4)

## 2015-12-19 MED ORDER — SODIUM CHLORIDE 0.9 % IV SOLN: 250.0000 mL | INTRAVENOUS | Status: DC | PRN

## 2015-12-19 MED ORDER — HYDROCHLOROTHIAZIDE 25 MG PO TABS
25.0000 mg | ORAL_TABLET | Freq: Every day | ORAL | Status: DC
  Administered 2015-12-19 – 2015-12-21 (×3): 25 mg via ORAL
  Filled 2015-12-19 (×4): qty 1

## 2015-12-19 MED ORDER — ENOXAPARIN SODIUM 80 MG/0.8ML ~~LOC~~ SOLN
70.0000 mg | SUBCUTANEOUS | Status: DC
  Administered 2015-12-19: 70 mg via SUBCUTANEOUS
  Filled 2015-12-19: qty 0.8

## 2015-12-19 MED ORDER — IPRATROPIUM-ALBUTEROL 0.5-2.5 (3) MG/3ML IN SOLN
3.0000 mL | RESPIRATORY_TRACT | Status: DC | PRN
  Administered 2015-12-19 (×2): 3 mL via RESPIRATORY_TRACT
  Filled 2015-12-19 (×2): qty 3

## 2015-12-19 MED ORDER — NICOTINE 14 MG/24HR TD PT24
14.0000 mg | MEDICATED_PATCH | Freq: Every day | TRANSDERMAL | Status: DC
  Administered 2015-12-19 – 2015-12-21 (×3): 14 mg via TRANSDERMAL
  Filled 2015-12-19 (×3): qty 1

## 2015-12-19 MED ORDER — METHYLPREDNISOLONE SODIUM SUCC 125 MG IJ SOLR: 125.0000 mg | Freq: Once | INTRAMUSCULAR | Status: DC

## 2015-12-19 MED ORDER — ALBUTEROL (5 MG/ML) CONTINUOUS INHALATION SOLN
10.0000 mg/h | INHALATION_SOLUTION | RESPIRATORY_TRACT | Status: DC
  Administered 2015-12-19: 10 mg/h via RESPIRATORY_TRACT
  Filled 2015-12-19: qty 20

## 2015-12-19 MED ORDER — THEOPHYLLINE ER 300 MG PO TB12
300.0000 mg | ORAL_TABLET | Freq: Every day | ORAL | Status: DC
  Administered 2015-12-19 – 2015-12-21 (×3): 300 mg via ORAL
  Filled 2015-12-19 (×3): qty 1

## 2015-12-19 MED ORDER — TRAMADOL HCL 50 MG PO TABS
25.0000 mg | ORAL_TABLET | Freq: Two times a day (BID) | ORAL | Status: DC
  Administered 2015-12-19 – 2015-12-21 (×4): 25 mg via ORAL
  Filled 2015-12-19 (×5): qty 1

## 2015-12-19 MED ORDER — PANTOPRAZOLE SODIUM 40 MG PO TBEC
40.0000 mg | DELAYED_RELEASE_TABLET | Freq: Two times a day (BID) | ORAL | Status: DC
  Administered 2015-12-19 – 2015-12-21 (×5): 40 mg via ORAL
  Filled 2015-12-19 (×6): qty 1

## 2015-12-19 MED ORDER — ENOXAPARIN SODIUM 80 MG/0.8ML ~~LOC~~ SOLN
0.5000 mg/kg | SUBCUTANEOUS | Status: DC
  Administered 2015-12-20 – 2015-12-21 (×2): 80 mg via SUBCUTANEOUS
  Filled 2015-12-19 (×2): qty 0.8

## 2015-12-19 MED ORDER — FLUTICASONE PROPIONATE 50 MCG/ACT NA SUSP
2.0000 | Freq: Every day | NASAL | Status: DC
  Administered 2015-12-19 – 2015-12-21 (×3): 2 via NASAL
  Filled 2015-12-19: qty 16

## 2015-12-19 MED ORDER — SERTRALINE HCL 50 MG PO TABS
50.0000 mg | ORAL_TABLET | Freq: Every day | ORAL | Status: DC
  Administered 2015-12-19 – 2015-12-21 (×3): 50 mg via ORAL
  Filled 2015-12-19 (×3): qty 1
  Filled 2015-12-19: qty 2

## 2015-12-19 MED ORDER — MAGNESIUM SULFATE 2 GM/50ML IV SOLN
2.0000 g | Freq: Once | INTRAVENOUS | Status: AC
  Administered 2015-12-19: 2 g via INTRAVENOUS
  Filled 2015-12-19: qty 50

## 2015-12-19 MED ORDER — ACETAMINOPHEN 325 MG PO TABS
650.0000 mg | ORAL_TABLET | Freq: Four times a day (QID) | ORAL | Status: DC | PRN
  Administered 2015-12-19 – 2015-12-21 (×2): 650 mg via ORAL
  Filled 2015-12-19 (×2): qty 2

## 2015-12-19 MED ORDER — PREDNISONE 20 MG PO TABS: 80.0000 mg | ORAL_TABLET | Freq: Every day | ORAL | Status: DC

## 2015-12-19 MED ORDER — IPRATROPIUM BROMIDE 0.02 % IN SOLN
0.5000 mg | Freq: Once | RESPIRATORY_TRACT | Status: AC
  Administered 2015-12-19: 0.5 mg via RESPIRATORY_TRACT
  Filled 2015-12-19: qty 2.5

## 2015-12-19 MED ORDER — SODIUM CHLORIDE 0.9% FLUSH
3.0000 mL | Freq: Two times a day (BID) | INTRAVENOUS | Status: DC
  Administered 2015-12-19 (×2): 3 mL via INTRAVENOUS

## 2015-12-19 MED ORDER — SODIUM CHLORIDE 0.9% FLUSH
3.0000 mL | Freq: Two times a day (BID) | INTRAVENOUS | Status: DC
  Administered 2015-12-19: 3 mL via INTRAVENOUS

## 2015-12-19 MED ORDER — METHYLPREDNISOLONE SODIUM SUCC 125 MG IJ SOLR
60.0000 mg | Freq: Two times a day (BID) | INTRAMUSCULAR | Status: DC
  Administered 2015-12-19 (×2): 60 mg via INTRAVENOUS
  Filled 2015-12-19 (×3): qty 2

## 2015-12-19 MED ORDER — LORATADINE 10 MG PO TABS
10.0000 mg | ORAL_TABLET | Freq: Every day | ORAL | Status: DC
  Administered 2015-12-19 – 2015-12-21 (×3): 10 mg via ORAL
  Filled 2015-12-19 (×3): qty 1

## 2015-12-19 MED ORDER — ACETAMINOPHEN 650 MG RE SUPP: 650.0000 mg | Freq: Four times a day (QID) | RECTAL | Status: DC | PRN

## 2015-12-19 MED ORDER — IBUPROFEN 800 MG PO TABS
800.0000 mg | ORAL_TABLET | Freq: Two times a day (BID) | ORAL | Status: DC | PRN
  Administered 2015-12-20 – 2015-12-21 (×4): 800 mg via ORAL
  Filled 2015-12-19 (×5): qty 1

## 2015-12-19 MED ORDER — POTASSIUM CHLORIDE CRYS ER 20 MEQ PO TBCR
40.0000 meq | EXTENDED_RELEASE_TABLET | Freq: Once | ORAL | Status: AC
  Administered 2015-12-19: 40 meq via ORAL
  Filled 2015-12-19: qty 2

## 2015-12-19 MED ORDER — SODIUM CHLORIDE 0.9% FLUSH: 3.0000 mL | INTRAVENOUS | Status: DC | PRN

## 2015-12-19 MED ORDER — DOCUSATE SODIUM 100 MG PO CAPS: 100.0000 mg | ORAL_CAPSULE | Freq: Two times a day (BID) | ORAL | Status: DC | PRN

## 2015-12-19 MED ORDER — IPRATROPIUM-ALBUTEROL 0.5-2.5 (3) MG/3ML IN SOLN
3.0000 mL | RESPIRATORY_TRACT | Status: DC
  Administered 2015-12-19 – 2015-12-21 (×12): 3 mL via RESPIRATORY_TRACT
  Filled 2015-12-19 (×10): qty 3

## 2015-12-19 MED ORDER — ALBUTEROL SULFATE (2.5 MG/3ML) 0.083% IN NEBU
2.5000 mg | INHALATION_SOLUTION | RESPIRATORY_TRACT | Status: DC | PRN
  Administered 2015-12-19 – 2015-12-21 (×3): 2.5 mg via RESPIRATORY_TRACT
  Filled 2015-12-19 (×3): qty 3

## 2015-12-19 MED ORDER — TRAZODONE HCL 50 MG PO TABS
50.0000 mg | ORAL_TABLET | Freq: Every day | ORAL | Status: DC
  Administered 2015-12-19 – 2015-12-20 (×2): 50 mg via ORAL
  Filled 2015-12-19 (×2): qty 1

## 2015-12-19 MED ORDER — MOMETASONE FURO-FORMOTEROL FUM 200-5 MCG/ACT IN AERO
2.0000 | INHALATION_SPRAY | Freq: Two times a day (BID) | RESPIRATORY_TRACT | Status: DC
  Administered 2015-12-19 – 2015-12-21 (×5): 2 via RESPIRATORY_TRACT
  Filled 2015-12-19: qty 8.8

## 2015-12-19 NOTE — Progress Notes (Signed)
Pharmacy adjusted lovenox dose based on weight of 160.3 kg. Pt will received 0.5mg /kg per dose (80mg ) q24h.  Andrey Cota. Diona Foley, PharmD, [place] Clinical Pharmacist Pager [phone]

## 2015-12-19 NOTE — H&P (Signed)
Date: 12/19/2015               Patient Name:  April [name] MRN: [id number]  DOB: 28-Nov-1970 Age / Sex: 45 y.o., female   PCP: No primary care provider on file.         Medical Service: Internal Medicine Teaching Service         Attending Physician: Dr. Aldine Contes, MD    First Contact: Dr. Ophelia Shoulder Pager: [phone]  Second Contact: Dr. [name] Pager: [phone]       After Hours (After 5p/  First Contact Pager: [phone]  weekends / holidays): Second Contact Pager: [phone]   Chief Complaint: Shortness of breath  History of Present Illness: April [name] is a 45 y.o. female  with PMH poorly-controlled asthma, COPD, GERD presents to the ED complaining of gradual, persistent, progressively worsening shortness of breath onset one week ago but acutely worsening again around 3 PM yesterday afternoon. Her symptoms have not been responding to her home nebulizers or inhalers, which she has taken so often that she is nearly out of them. She has been taking her scheduled inhalers, theophylline, and receiving Xolair injections as prescribed. [name] was seen in the ED yesterday for similar symptoms, received magnesium and Solumedrol at that time, and insisted on going home after receiving nebulizer treatments. At home her symptoms persisted with no improvement so she returned to the ED tonight. She is denying fever, chills, nausea, vomiting, diarrhea, weakness, dizziness, syncope, dysuria, hematuria. Patient has no recent history of travel or history of blood clots.  She reports associated dry cough but no productive cough. Denies sore through, sneezing, rhinorrhea. Endorses BLE swelling for the past 5 days and her LLE swelling has persisted and is now larger than her RLE, which has come down. She says such swelling has never occurred before. She complains of pain over left anterior calf as well as headache. She requests full diet, pain medication for her headache, and ability to take a shower  tomorrow.   In ED, vitals - afebrile, HR 121, RR 22, BP 160/79, 100% on NRB 8L, tachypneic with increased WOB and wheezing throughout. Received Mg 2g, atrovent neb, albuterol neb. EKG early yesterday with no changes, CXR with no new abnormalities. Labs significant for only WBC 20.7, Hgb 10.7, K 3.0.  Meds:  Current Facility-Administered Medications for the 12/19/15 encounter [facility] Encounter)  Medication  . omalizumab Arvid Right) injection 225 mg  . omalizumab Arvid Right) injection 225 mg   No outpatient prescriptions have been marked as taking for the 12/19/15 encounter [facility] Encounter).  Albuterol neb Docusate Nexium Flonase HCTZ Ibuprofen Atrovent neb Claritin Dulera BID Nicoderm Xolair every other week Zoloft Theophylline 300mg  QD Ventolin   Allergies: Allergies as of 12/18/2015  . (No Known Allergies)   Past Medical History:  Diagnosis Date  . Acanthosis nigricans   . Arthritis   . Asthma    Followed by Dr. Annamaria Boots (pulmonology); receives every other week omalizumab injections; has frequent exacerbations  . COPD (chronic obstructive pulmonary disease) ([place])    PFTs in 2002, FEV1/FVC 65, no post bronchodilater test done  . Depression   . GERD (gastroesophageal reflux disease)   . Headache(784.0)   . Helicobacter pylori (H. pylori) infection   . Hypertension   . Hypertension, essential   . Insomnia   . Menorrhagia   . Morbid obesity ([place])   . Obesity   . Seasonal allergies   . Shortness of breath   .  Sleep apnea    Sleep study 2008 - mild OSA, not enough events to titrate CPAP  . Tobacco user     Family History:  Family History  Problem Relation Age of Onset  . Hypertension Mother   . Asthma April [name]   . Cancer Paternal Aunt   . Asthma Maternal Grandmother     Social History:  Social History   Social History  . Marital status: Single    Spouse name: N/A  . Number of children: 1  . Years of education: N/A   Occupational History  . [employer]   Social History Main Topics  . Smoking status: Former Smoker    Years: 18.00    Types: Cigarettes    Quit date: 09/15/2014  . Smokeless tobacco: Former Systems developer    Quit date: 09/12/2014  . Alcohol use No  . Drug use: No  . Sexual activity: Not on file   Other Topics Concern  . 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     Review of Systems: A complete ROS was negative except as per HPI.   Physical Exam: Blood pressure 168/86, pulse (!) 144, temperature 99 F (37.2 C), temperature source Oral, resp. rate (!) 27, height 5\' 5"  (1.651 m), weight (!) 145.2 kg (320 lb), last menstrual period 11/17/2015, SpO2 100 %. General appearance: Obese female sitting in bedside chair, audibly wheezing, conversational, mild distress HENT: Normocephalic, atraumatic, moist mucous membranes, Mallampati 4, poor dentition, moon face, buffalo hump Eyes: PERRL, EOM intact Cardiovascular: Tachycardic rate, otherwise regular rhythm, no m/r/g Respiratory: Tachypneic, increased WOB with some accessory muscle use, diffuse expiratory wheezes throughout with prolonged expiration phase, no rhonchi/rales on inspiration Abdomen: BS+, soft, NT, ND Skin: Warm, dry, intact Extremities: LLL and foot visibly swollen > RLE with 1+ pitting edema to mid calf, peripheral pulses 2+ Neuro: Alert and oriented, cranial nerves grossly intact Psych: Appropriate affect  EKG: Sinus tach, borderline LAE   CXR: Clear lungs  Assessment & Plan by Problem:  Asthma exacerbation, acute on chronic, similar to previous, unclear trigger possibly allergic/seasonal, ongoing tachypnea, increased WOB with diffuse wheezing and prolonged expiratory phase  -- Duonebs scheduled Q4H PRN  -- Albuterol neb Q2H PRN   -- Prednisone 80 mg QD for 4-5 days then resume home dose  --  Continue home Theophylline, Dulera  -- Supplemental oxygen as needed  -- Telemetry and continuous pulse ox  LLE swelling, pitting edema, painful, asymmetric L>R, never has occurred before, denies previous blood clots, recent travel but is obese with limited mobility, not very active  -- LLE duplex ultrasound to check for DVT  GERD, ongoing  -- Protonix 40mg  BID  Hypokalemia, secondary to albuerol use, K 3.0  -- s/p 1x IV KCl  -- KDUR 40 mEq po once  -- Recheck BMP at least 12-18 hours later  Headache, mild  -- Tylenol 650 mg Q6H PRN  Seasonal allergies  -- continue home fluticasone, loratadine  HTN   -- home HCTZ  Tobacco abuse cessation  -- Nicoderm CQ   Dispo: Admit patient to Observation with expected length of stay less than 2 midnights.  Signed: Asencion Partridge, MD 12/19/2015, 3:39 AM  Pager: [phone]

## 2015-12-19 NOTE — Progress Notes (Signed)
   Subjective: Patient still markedly short of breath. She is diffusely wheezy on her pulmonary examination. She denies nausea, vomiting or abdominal pain. She denies chest pain. She has no additional acute complaints or concerns this morning.  Objective:  Vital signs in last 24 hours: Vitals:   12/19/15 0330 12/19/15 0345 12/19/15 0600 12/19/15 0649  BP: (!) 207/173 (!) 145/131 164/86 (!) 174/96  Pulse: (!) 140 (!) 121 96 (!) 104  Resp: (!) 36 (!) 29 (!) 32 (!) 21  Temp:    98.2 F (36.8 C)  TempSrc:    Oral  SpO2: 97% 99% 100% 100%  Weight:    (!) 353 lb 6.4 oz (160.3 kg)  Height:       Physical Exam  Constitutional: She is oriented to person, place, and time. She appears well-developed and well-nourished.  Tachypnea, using accessory muscles of respiration, not able to speak in complete sentences, notably short of breath  HENT:  Head: Normocephalic and atraumatic.  Cardiovascular: Normal rate and regular rhythm.  Exam reveals no gallop and no friction rub.   No murmur heard. Respiratory: Effort normal. She has wheezes.  Patient moving good air. Diffuse wheezes bilaterally in all lung fields  GI: Soft. Bowel sounds are normal. She exhibits no distension. There is no tenderness.  Musculoskeletal: She exhibits no edema.  Neurological: She is oriented to person, place, and time.     Assessment/Plan:  Ms. April [name] is a 45 year old female with a past medical history of hypertension, morbid obesity, depression and severe persistent asthma requiring multiple hospitalizations who presents with a asthma exacerbation.  1. Asthma exacerbation Patient presented to the emergency department diffusely wheezy and short of breath. She is currently having an asthma exacerbation. Chest x-ray did not show any acute cardiopulmonary process. She is compliant with all of her medications. -- Albuterol nebulizer every 2 hours -- Fluticasone nasal spray 2 sprays per day -- Ipratropium-albuterol  nebulizer every 4 hours as needed -- Methylprednisolone 60 mg twice daily -- Dulera of 2 puffs twice daily -- Theophylline 300 mg every 12 hours -- We'll discuss her care with her pulmonologist -- For ranitidine 10 mg once daily  2. Depression -- Continue sertraline 50 mg once daily  3. Hypertension -- Hydrochlorothiazide 25 mg once daily  4. Hypokalemia, resolved Patient with hypokalemia on previous lab work. This is most likely secondary to intracellular shifts in the setting of heavy beta agonist usage. -- Most recent potassium of 3.5  5. DVT/PE prophylaxis -- Anoxia parent 80 mg subcutaneous injection every 24 hours  Dispo: Anticipated discharge in approximately 1-2 day(s).   Ophelia Shoulder, MD 12/19/2015, 11:55 AM Pager: [phone]

## 2015-12-19 NOTE — ED Notes (Signed)
Per EMS report pt received 10 mg albuterol, 0.5 mg atrovent, and 125 mg solumedrol prior to arrival to the Ed.

## 2015-12-19 NOTE — ED Notes (Signed)
Attempted to call report at this time 

## 2015-12-19 NOTE — ED Provider Notes (Signed)
[place] DEPT Provider Note   CSN: [id number] Arrival date & time: 12/19/15  0006     History   Chief Complaint Chief Complaint  Patient presents with  . Shortness of Breath    HPI April [name] is a 45 y.o. female.  April [name] is a 45 y.o. female  with a hx of asthma, COPD, GERD presents to the Emergency Department complaining of gradual, persistent, progressively worsening shortness of breath onset several days ago but acutely worsening again around 3 PM this afternoon. I personally saw the patient last night for similar exacerbation. She was given Solu-Medrol and magnesium. Her labs were assessed and after continuous nebulizer she wished for discharge home. She and I discussed at length my concern about her discharge home and failure to improve through a shared decision making conversation.  She was adamant that after her treatment she would go home.  She is denying fever, chills, nausea, vomiting, diarrhea, weakness, dizziness, syncope, dysuria, hematuria. Patient has no recent history of travel or history of blood clots.  She reports associated cough but no infectious symptoms.   The history is provided by the patient and medical records. No language interpreter was used.    Past Medical History:  Diagnosis Date  . Acanthosis nigricans   . Arthritis   . Asthma    Followed by Dr. Annamaria Boots (pulmonology); receives every other week omalizumab injections; has frequent exacerbations  . COPD (chronic obstructive pulmonary disease) ([place])    PFTs in 2002, FEV1/FVC 65, no post bronchodilater test done  . Depression   . GERD (gastroesophageal reflux disease)   . Headache(784.0)   . Helicobacter pylori (H. pylori) infection   . Hypertension   . Hypertension, essential   . Insomnia   . Menorrhagia   . Morbid obesity ([place])   . Obesity   . Seasonal allergies   . Shortness of breath   . Sleep apnea    Sleep study 2008 - mild OSA, not enough events to titrate CPAP  . Tobacco  user     Patient Active Problem List   Diagnosis Date Noted  . Asthma exacerbation 11/10/2015  . GERD (gastroesophageal reflux disease) 08/30/2015  . Generalized anxiety disorder 08/30/2015  . Seasonal allergic rhinitis 08/29/2013  . Acute asthma exacerbation 05/22/2012  . Asthma, chronic obstructive, without status asthmaticus ([place]) 05/07/2012  . Knee pain, bilateral 04/25/2011  . Obstructive sleep apnea 12/19/2010  . Cervical back pain with evidence of disc disease 04/08/2008  . Essential hypertension 07/31/2006  . Morbid obesity with body mass index of 50.0-59.9 in adult ([place]) 06/17/2006  . Major depressive disorder, recurrent episode ([place]) 04/10/2006    Past Surgical History:  Procedure Laterality Date  . BREAST REDUCTION SURGERY  09/2011  . TUBAL LIGATION  1996   bilateral    OB History    No data available       Home Medications    Prior to Admission medications   Medication Sig Start Date End Date Taking? Authorizing Provider  acetaminophen (TYLENOL) 500 MG tablet Take 2 tablets (1,000 mg total) by mouth every 6 (six) hours as needed. Patient taking differently: Take 1,000 mg by mouth every 6 (six) hours as needed for mild pain.  08/30/15   [name], MD  albuterol (PROVENTIL) (2.5 MG/3ML) 0.083% nebulizer solution Take 3 mLs (2.5 mg total) by nebulization every 6 (six) hours as needed for wheezing or shortness of breath. Patient taking differently: Take 2.5 mg by nebulization every 2 (two)  hours as needed for wheezing or shortness of breath.  08/30/15   [name], MD  docusate sodium (STOOL SOFTENER) 100 MG capsule Take 100 mg by mouth 2 (two) times daily as needed for mild constipation.  10/24/10   Historical Provider, MD  esomeprazole (NEXIUM) 40 MG capsule Take 40 mg by mouth daily. 08/30/15   Historical Provider, MD  fluticasone (FLONASE) 50 MCG/ACT nasal spray Place 2 sprays into both nostrils daily. 09/27/10   Historical Provider, MD  hydrochlorothiazide  (HYDRODIURIL) 25 MG tablet Take 1 tablet (25 mg total) by mouth daily. 08/30/15   [name], MD  ibuprofen (ADVIL,MOTRIN) 200 MG tablet Take 800 mg by mouth 2 (two) times daily as needed for moderate pain (knee pain).    Historical Provider, MD  ipratropium (ATROVENT) 0.02 % nebulizer solution Take 2.5 mLs (0.5 mg total) by nebulization 4 (four) times daily as needed for wheezing or shortness of breath. 08/30/15   [name], MD  loratadine (CLARITIN) 10 MG tablet Take 1 tablet (10 mg total) by mouth daily. 08/30/15   [name], MD  mometasone-formoterol [facility]) 200-5 MCG/ACT AERO Inhale 2 puffs into the lungs 2 (two) times daily. 11/27/15   Asencion Partridge, MD  mometasone-formoterol [facility]) 200-5 MCG/ACT AERO Inhale 2 puffs into the lungs 2 (two) times daily. 12/14/15 12/15/15  Deneise Lever, MD  nicotine (NICODERM CQ - DOSED IN MG/24 HOURS) 14 mg/24hr patch Place 14 mg onto the skin 3 (three) times a week.    Historical Provider, MD  omalizumab Arvid Right) 150 MG injection Inject 150 mg into the skin every 14 (fourteen) days.    Historical Provider, MD  sertraline (ZOLOFT) 50 MG tablet Take 1 tablet (50 mg total) by mouth daily. 08/30/15   [name], MD  theophylline (THEODUR) 300 MG 12 hr tablet Take 1 tablet (300 mg total) by mouth daily. 10/26/15   Burgess Estelle, MD  VENTOLIN HFA 108 (90 Base) MCG/ACT inhaler INHALE 1-2 PUFFS INTO THE LUNGS EVERY 4 HOURS. 11/28/15   Deneise Lever, MD    Family History Family History  Problem Relation Age of Onset  . Hypertension Mother   . Asthma Daughter   . Cancer Paternal Aunt   . Asthma Maternal Grandmother     Social History Social History  Substance Use Topics  . Smoking status: Former Smoker    Years: 18.00    Types: Cigarettes    Quit date: 09/15/2014  . Smokeless tobacco: Former Systems developer    Quit date: 09/12/2014  . Alcohol use No     Allergies   Review of patient's allergies indicates no known allergies.   Review of Systems Review of Systems    Respiratory: Positive for cough, chest tightness, shortness of breath and wheezing.   All other systems reviewed and are negative.    Physical Exam Updated Vital Signs BP (!) 173/103   Pulse (!) 124   Temp 99 F (37.2 C) (Oral)   Resp (!) 33   Ht 5\' 5"  (1.651 m)   Wt (!) 145.2 kg   LMP 11/17/2015   SpO2 100%   BMI 53.25 kg/m   Physical Exam  Constitutional: She appears well-developed and well-nourished. She appears distressed.  Awake, alert, nontoxic appearance  HENT:  Head: Normocephalic and atraumatic.  Mouth/Throat: Oropharynx is clear and moist. No oropharyngeal exudate.  Eyes: Conjunctivae are normal. No scleral icterus.  Neck: Normal range of motion. Neck supple.  Cardiovascular: Regular rhythm and intact distal pulses.  Tachycardia present.  Pulses:      Radial pulses are 2+ on the right side, and 2+ on the left side.  Pulmonary/Chest: Accessory muscle usage present. Tachypnea noted. She is in respiratory distress. She has decreased breath sounds. She has wheezes ( Throughout).  Equal chest expansion  Abdominal: Soft. Bowel sounds are normal. She exhibits no mass. There is no tenderness. There is no rebound and no guarding.  Musculoskeletal: Normal range of motion. She exhibits no edema.  Nonpitting edema of the bilateral forefoot remains unchanged.  Neurological: She is alert.  Speech is clear and goal oriented Moves extremities without ataxia  Skin: Skin is warm and dry. She is not diaphoretic.  Psychiatric: She has a normal mood and affect.  Nursing note and vitals reviewed.    ED Treatments / Results  Labs (all labs ordered are listed, but only abnormal results are displayed) Labs Reviewed  CBC WITH DIFFERENTIAL/PLATELET - Abnormal; Notable for the following:       Result Value   WBC 20.7 (*)    Hemoglobin 10.7 (*)    HCT 35.2 (*)    RDW 17.6 (*)    Neutro Abs 17.8 (*)    Monocytes Absolute 1.3 (*)    All other components within normal limits   BASIC METABOLIC PANEL - Abnormal; Notable for the following:    Potassium 3.0 (*)    Glucose, Bld 157 (*)    Calcium 8.5 (*)    All other components within normal limits    EKG  EKG Interpretation None       Radiology Dg Chest 2 View  Result Date: 12/18/2015 CLINICAL DATA:  Shortness of breath EXAM: CHEST  2 VIEW COMPARISON:  Chest radiograph 11/26/2015 FINDINGS: Cardiomediastinal contours are normal. No pneumothorax or sizable pleural effusion. No focal airspace consolidation or pulmonary edema. IMPRESSION: Clear lungs. Electronically Signed   By: Ulyses Jarred M.D.   On: 12/18/2015 05:35    Procedures Procedures (including critical care time)  Medications Ordered in ED Medications  albuterol (PROVENTIL,VENTOLIN) solution continuous neb (10 mg/hr Nebulization New Bag/Given 12/19/15 0138)  magnesium sulfate IVPB 2 g 50 mL (2 g Intravenous New Bag/Given 12/19/15 0202)  potassium chloride SA (K-DUR,KLOR-CON) CR tablet 40 mEq (not administered)  ipratropium (ATROVENT) nebulizer solution 0.5 mg (0.5 mg Nebulization Given 12/19/15 0138)     Initial Impression / Assessment and Plan / ED Course  I have reviewed the triage vital signs and the nursing notes.  Pertinent labs & imaging results that were available during my care of the patient were reviewed by me and considered in my medical decision making (see chart for details).  Clinical Course  Comment By Time  Patient continues to wheeze.  This is her second visit in 24 hours. She will need admission. Abigail Butts, PA-C 08/22 0219  Discussed with Hal Hope who will admit.   Abigail Butts, PA-C 08/22 0226   Patient with asthma exacerbation. She is a bounce back in the last 24 hours. She has required a Medrol, continuous nebulizer and mag. Will admit.  Final Clinical Impressions(s) / ED Diagnoses   Final diagnoses:  Asthma exacerbation  Hypokalemia    New Prescriptions New Prescriptions   No medications on  file     Abigail Butts, PA-C 12/19/15 [id number]    Merryl Hacker, MD 12/19/15 [phone]

## 2015-12-19 NOTE — ED Triage Notes (Signed)
Pt arrives to [facility] at this time via [facility] for shortness of breath. Pt states that she was d/c'd from the ED yesterday morning 12/18/15 for the same.  Pt reports continued cough and shortness of breath.   Chief Complaint  Patient presents with  . Shortness of Breath   Past Medical History:  Diagnosis Date  . Acanthosis nigricans   . Arthritis   . Asthma    Followed by Dr. Annamaria Boots (pulmonology); receives every other week omalizumab injections; has frequent exacerbations  . COPD (chronic obstructive pulmonary disease) ([place])    PFTs in 2002, FEV1/FVC 65, no post bronchodilater test done  . Depression   . GERD (gastroesophageal reflux disease)   . Headache(784.0)   . Helicobacter pylori (H. pylori) infection   . Hypertension   . Hypertension, essential   . Insomnia   . Menorrhagia   . Morbid obesity ([place])   . Obesity   . Seasonal allergies   . Shortness of breath   . Sleep apnea    Sleep study 2008 - mild OSA, not enough events to titrate CPAP  . Tobacco user

## 2015-12-19 NOTE — Progress Notes (Addendum)
*  PRELIMINARY RESULTS* Vascular Ultrasound Lower extremity venous duplex has been completed.  Preliminary findings: Technically limited due to body habitus. No obvious evidence of DVT bilaterally. Appears to be left baker's cyst.   Landry Mellow, RDMS, RVT  12/19/2015, 1:56 PM

## 2015-12-19 NOTE — ED Notes (Signed)
Report called to Morey Hummingbird, RN at this time.  Receiving nurse denies having any further questions at this time.

## 2015-12-20 DIAGNOSIS — I10 Essential (primary) hypertension: Secondary | ICD-10-CM

## 2015-12-20 DIAGNOSIS — E876 Hypokalemia: Secondary | ICD-10-CM

## 2015-12-20 DIAGNOSIS — J45901 Unspecified asthma with (acute) exacerbation: Secondary | ICD-10-CM

## 2015-12-20 DIAGNOSIS — Z6841 Body Mass Index (BMI) 40.0 and over, adult: Secondary | ICD-10-CM

## 2015-12-20 DIAGNOSIS — K219 Gastro-esophageal reflux disease without esophagitis: Secondary | ICD-10-CM

## 2015-12-20 MED ORDER — PREDNISONE 20 MG PO TABS
80.0000 mg | ORAL_TABLET | Freq: Every day | ORAL | Status: DC
  Administered 2015-12-20 – 2015-12-21 (×2): 80 mg via ORAL
  Filled 2015-12-20 (×2): qty 4

## 2015-12-20 NOTE — Progress Notes (Signed)
Refuses to put on the non skid socks before ambulation.[name]

## 2015-12-20 NOTE — Progress Notes (Signed)
   Subjective: No acute events overnight. Patient states that she is feeling much improved this morning. Her respiratory status has improved and she is able to speak in complete sentences. She has no additional acute complaints or concerns this morning.  Objective:  Vital signs in last 24 hours: Vitals:   12/19/15 2145 12/20/15 0029 12/20/15 0305 12/20/15 0504  BP:    (!) 159/70  Pulse:    89  Resp:    20  Temp:    97.7 F (36.5 C)  TempSrc:    Oral  SpO2: 97% 98% 96% 99%  Weight:      Height:       Physical Exam  Constitutional: She is oriented to person, place, and time. She appears well-developed and well-nourished.  In no acute distress, able to speak in complete sentences  HENT:  Head: Normocephalic and atraumatic.  Cardiovascular: Normal rate and regular rhythm.  Exam reveals no gallop and no friction rub.   No murmur heard. Respiratory: Effort normal. No respiratory distress. She has wheezes.  Patient is moving good air. She has diffuse bilateral wheezes in all lung fields  GI: Soft. Bowel sounds are normal. She exhibits no distension. There is no tenderness.  Musculoskeletal: She exhibits no edema.  Neurological: She is alert and oriented to person, place, and time.     Assessment/Plan: Ms. Bevins is a 45 year old female with a past medical history of hypertension, morbid obesity, depression and severe persistent asthma requiring multiple hospitalizations who presents with a asthma exacerbation.  1. Asthma exacerbation, improving Patient presented to the emergency department diffusely wheezy and short of breath. She is currently having an asthma exacerbation. Chest x-ray did not show any acute cardiopulmonary process. She is compliant with all of her medications. -- Albuterol nebulizer every 2 hours -- Fluticasone nasal spray 2 sprays per day -- Ipratropium-albuterol nebulizer every 4 hours as needed -- Prednisone 80 mg orally -- Dulera of 2 puffs twice daily --  Theophylline 300 mg every 12 hours -- We'll discuss her care with her pulmonologist -- For ranitidine 10 mg once daily -- At time of discharge we will send Ms. Monnett out on a steroid taper as follows 80 mg 3 days, 70 mg 3 days, 60 mg 3 days, 50 mg 3 days, 40 mg 3 days etc... -- Additionally, we'll schedule her a follow-up appointment with her pulmonologist and primary care physicians  2. Depression -- Continue sertraline 50 mg once daily  3. Hypertension -- Hydrochlorothiazide 25 mg once daily  4. Hypokalemia, resolved Patient with hypokalemia on previous lab work. This is most likely secondary to intracellular shifts in the setting of heavy beta agonist usage. -- Most recent potassium of 3.5  5. DVT/PE prophylaxis -- Lovenox 80 mg subcutaneous injection every 24 hours   Dispo: Anticipated discharge this afternoon or tomorrow morning.   Ophelia Shoulder, MD 12/20/2015, 1:08 PM Pager: [phone]

## 2015-12-20 NOTE — Progress Notes (Signed)
Per NT pt not in her room or bathroom. Educated the patient this morning multiple times that if she was going to walk she needed to inform the nurse. Pt not on telemetry. Pt was given okay by MD to walk the halls. Will re-educate the patient and emphasize importance of care and compliance upon return.   Fritz Pickerel, RN

## 2015-12-20 NOTE — Progress Notes (Signed)
   Subjective:  No acute events overnight. Patient feeling much improved today. Continues to be mildly SOB on exertion. Also endorses productive cough today. Denied L leg pain.   Objective:  Vital signs in last 24 hours: Vitals:   12/19/15 2145 12/20/15 0029 12/20/15 0305 12/20/15 0504  BP:    (!) 159/70  Pulse:    89  Resp:    20  Temp:    97.7 F (36.5 C)  TempSrc:    Oral  SpO2: 97% 98% 96% 99%  Weight:      Height:       Physical Exam:  General: pleasant, able to speak in full sentences, sitting in bed in NAD  CV: RRR, no m/r/g Chest: better air movement than yesterday, coarse breath sounds, no wheezes or crackles appreciated, no increased work of breathing Abd: normoactive bowel sounds, NTND, difficult exam due to body habitus  Extremities: warm and well perfused, L>R pitting edema  Neuro: A&Ox4, no focal deficits   Labs/Studies:  1. Doppler US LE bilateral: Technically limited due to body habitus. No evidence of deep vein thrombosis involving the visualized veins of the right lower extremity and left lower extremity. Incidental findings are consistent with: Complex Baker&'s Cyst on the left. No evidence of Baker&'s cyst on the right.  Assessment/Plan:  Active Problems:   Morbid obesity with body mass index of 50.0-59.9 in adult [facility])   Essential hypertension   GERD (gastroesophageal reflux disease)   Asthma exacerbation  Ms. April [name] is a 45 yo F with history of multiple admissions for asthma exacerbations 2/2 medication non-compliance, COPD, and GERD who presented 2 days ago with an asthma exacerbation. She is much improved today. She is able to speak in full sentences and has better air movement on lung exam. Given significant improvement in symptoms will transition her to PO prednisone today. She has been satting well on RA and is stable to be discharged home today. Per Dr. Annamaria Boots, patient's pulmonologist, patient's non-compliance with medication and inability to show to  her follow up appts is her most significant barrier to care at this time. She seems to be a good candidate for newer agents such as IL-5 antagonists, but she needs to be able to follow up with pulmonology on a regular basis.   1. Asthma exacerbation:   - Transitioned IV--> PO prednisone 80mg . Will plan to taper down by 10mg  every 3 days - Will schedule follow up appt with Dr. Annamaria Boots  - Continue albuterol nebs q2h PRN  - Continue home fluticasone  - Continue duonebs q4h PRN  - Duleraof 2 puffs twice daily - Continue home theophylline 300 mg every 12 hours  2. Depression:  - Continue home sertraline 50mg    3. HTN:  - Continue home HCTZ 25  4. Hypokalemia: resolved.   5. L leg pain: complex baker cyst on L knee found on doppler US  - No active management at this time  - Recommend outpatient follow up  DVT: SQ lovenox  Diet: Carb modified diet   Dispo: Anticipated discharge in approximately today or tomorrow.    LOS: 1 day   Welford Roche, Medical Student 12/20/2015, 1:17 PM Pager: [phone]

## 2015-12-21 DIAGNOSIS — F329 Major depressive disorder, single episode, unspecified: Secondary | ICD-10-CM

## 2015-12-21 MED ORDER — GUAIFENESIN ER 600 MG PO TB12: 600.0000 mg | ORAL_TABLET | Freq: Two times a day (BID) | ORAL | 0 refills | Status: DC | PRN

## 2015-12-21 MED ORDER — PREDNISONE 20 MG PO TABS: ORAL_TABLET | ORAL | 0 refills | Status: DC

## 2015-12-21 NOTE — Progress Notes (Signed)
   Subjective: No acute events overnight. Patient continues to improve. She still has bilateral diffuse wheezing. She states that she feels closer to her baseline at this time. She has no additional acute complaints or concerns this morning.  She will have extremely close follow-up and is seeing her pulmonologist on 12/22/2015. At this time and with close follow-up we think she is appropriate for discharge.  Objective:  Vital signs in last 24 hours: Vitals:   12/20/15 2126 12/21/15 0025 12/21/15 0329 12/21/15 1054  BP:   129/63   Pulse:   (!) 107 92  Resp:   20   Temp:   98.3 F (36.8 C)   TempSrc:   Oral   SpO2: 99% 98% 96%   Weight:      Height:       Physical Exam  Constitutional: She is oriented to person, place, and time. She appears well-developed and well-nourished.  HENT:  Head: Normocephalic and atraumatic.  Cardiovascular: Normal rate and regular rhythm.  Exam reveals no gallop and no friction rub.   No murmur heard. Respiratory: Effort normal. She has wheezes.  Patient has diffuse bilateral wheezing which seems improved since yesterday. She is moving good air and is not in respiratory distress.  GI: Soft. Bowel sounds are normal. She exhibits no distension. There is no tenderness.  No abdominal bruits auscultated  Musculoskeletal: She exhibits no edema.  Neurological: She is alert and oriented to person, place, and time.     Assessment/Plan: Ms. Canney is a 45 year old female with a past medical history of hypertension, morbid obesity, depression and severe persistent asthma requiring multiple hospitalizations who presents with an asthma exacerbation.  Respiratory status is improved and she is moving good air. Additionally, she has close follow-up with her pulmonologist scheduled for 12/22/2015. At this time she is stable for discharge with close follow-up.  1. Asthma exacerbation, improving Patient presented to the emergency department diffusely wheezy and short of  breath. She is currently having an asthma exacerbation. Chest x-ray did not show any acute cardiopulmonary process. She is compliant with all of her medications. -- Fluticasone nasal spray 2 sprays per day -- Ipratropium-albuterol nebulizer every 4 hours as needed -- Prednisone 80 mg orally, please see scheduled taper below -- Dulera 2 puffs twice daily -- Theophylline 300 mg every 12 hours -- At time of discharge we will send Ms. Sweigert out on a steroid taper as follows 80 mg 3 days, 70 mg 3 days, 60 mg 3 days, 50 mg 3 days, 40 mg 3 days etc... -- Patient has follow-up with pulmonology on 12/22/2015  2. Depression -- Continue sertraline 50 mg once daily  3. Hypertension -- Hydrochlorothiazide 25 mg once daily  4. Hypokalemia, resolved Patient with hypokalemia on previous lab work. This is most likely secondary to intracellular shifts in the setting of heavy beta agonist usage. -- Most recent potassium of 3.5  5. DVT/PE prophylaxis -- Lovenox 80 mg subcutaneous injection every 24 hours  Dispo: Will be discharged today with close pulmonary follow-up.   Ophelia Shoulder, MD 12/21/2015, 11:40 AM Pager: [phone]

## 2015-12-21 NOTE — Progress Notes (Signed)
Scheduled neb not given, RRT in multiple CODES in [facility]M ICU.

## 2015-12-21 NOTE — Progress Notes (Signed)
   Subjective:  No acute events overnight. Patient not feeling at baseline, but feels like she is improving. Continues to have productive cough of white sputum. No other complaints this AM.   Objective:  Vital signs in last 24 hours: Vitals:   12/21/15 0025 12/21/15 0329 12/21/15 1054 12/21/15 1351  BP:  129/63  (!) 162/96  Pulse:  (!) 107 92 90  Resp:  20  20  Temp:  98.3 F (36.8 C)  98.2 F (36.8 C)  TempSrc:  Oral  Oral  SpO2: 98% 96%  98%  Weight:      Height:        Physical Exam:  General: pleasant, able to speak in full sentences, sleeping in bed  CV: RRR, no m/r/g Chest: coarse breath sounds, diffuse wheezing bilaterally, good air movement  Abd: normoactive bowel sounds, NTND, difficult exam due to body habitus  Extremities: warm and well perfused, L>R trace edema  Neuro: A&Ox4, no focal deficits   Labs/Studies:  None   Assessment/Plan:  Active Problems:   Morbid obesity with body mass index of 50.0-59.9 in adult [facility])   Essential hypertension   GERD (gastroesophageal reflux disease)   Asthma exacerbation  April [name] is a 45 yo F with history of uncontrolled asthma 2/2 medication non-compliance, COPD, and GERD who presented with an asthma exacerbation. She has required multiple hospitalizations this year for asthma exacerbations. She has diffuse wheezing on lung exam today, but states she is feeling much better than prior to admission. We emphasized importance of compliance with meds and following up with pulmonary as an outpatient. She will be discharged home today on her home medications and a PO prednisone taper. She has a follow up appt with Dr. Annamaria Boots, her pulmonologist, tomorrow morning.   1. Asthma exacerbation:   - Continue PO prednisone 80mg  - Day2. Will plan to taper down by 10mg  every 3 days - Continue albuterol nebs q2h PRN  - Continue home fluticasone  - Continue duonebs q4h PRN  - Duleraof 2 puffs twice daily - Continue home theophylline 300  mg every 12 hours  2. Depression:  - Continue home sertraline 50mg    3. HTN:  - Continue home HCTZ 25  4. Hypokalemia: resolved.   5. L leg pain: complex baker cyst on L knee found on doppler US  - No active management at this time  - Recommend outpatient follow up  DVT: SQ lovenox  Diet: Carb modified diet    Dispo: Anticipated discharge today.   LOS: 2 days   Welford Roche, Medical Student 12/21/2015, 2:46 PM Pager: [phone]

## 2015-12-21 NOTE — Discharge Instructions (Signed)
You will start a long steroid taper.   Take prednisone 80mg  for three days and the decrease steroid dose by 10mg  every 3 days. Once you have gotten down to 10mg , take it for 3 days then stop completely.

## 2015-12-21 NOTE — Progress Notes (Signed)
Orders received to discharge patient.  Patient expresses readiness to discharge.  Discharge instructions, follow up, medications and instructions for their use were discussed with patient and patient expresses understanding.  Telemetry monitor removed and CCMD notified.

## 2015-12-22 ENCOUNTER — Inpatient Hospital Stay: Payer: Self-pay | Admitting: Internal Medicine

## 2015-12-22 NOTE — Discharge Summary (Signed)
Name: April [name] MRN: [id number] DOB: 10-31-1970 45 y.o. PCP: No primary care provider on file.  Date of Admission: 12/19/2015 12:06 AM Date of Discharge: 12/22/2015 Attending Physician: No att. providers found  Discharge Diagnosis: 1. Asthma exacerbation  Discharge Medications:   Medication List    TAKE these medications   acetaminophen 500 MG tablet Commonly known as:  TYLENOL Take 2 tablets (1,000 mg total) by mouth every 6 (six) hours as needed. What changed:  reasons to take this   albuterol (2.5 MG/3ML) 0.083% nebulizer solution Commonly known as:  PROVENTIL Take 3 mLs (2.5 mg total) by nebulization every 6 (six) hours as needed for wheezing or shortness of breath. What changed:  when to take this   VENTOLIN HFA 108 (90 Base) MCG/ACT inhaler Generic drug:  albuterol INHALE 1-2 PUFFS INTO THE LUNGS EVERY 4 HOURS. What changed:  Another medication with the same name was changed. Make sure you understand how and when to take each. Notes to patient:  Every four hours   esomeprazole 40 MG capsule Commonly known as:  NEXIUM Take 40 mg by mouth daily.   FLONASE 50 MCG/ACT nasal spray Generic drug:  fluticasone Place 2 sprays into both nostrils daily.   guaiFENesin 600 MG 12 hr tablet Commonly known as:  MUCINEX Take 1 tablet (600 mg total) by mouth 2 (two) times daily as needed.   hydrochlorothiazide 25 MG tablet Commonly known as:  HYDRODIURIL Take 1 tablet (25 mg total) by mouth daily.   ibuprofen 200 MG tablet Commonly known as:  ADVIL,MOTRIN Take 800 mg by mouth 2 (two) times daily as needed for moderate pain (knee pain).   ipratropium 0.02 % nebulizer solution Commonly known as:  ATROVENT Take 2.5 mLs (0.5 mg total) by nebulization 4 (four) times daily as needed for wheezing or shortness of breath.   loratadine 10 MG tablet Commonly known as:  CLARITIN Take 1 tablet (10 mg total) by mouth daily.   mometasone-formoterol 200-5 MCG/ACT Aero Commonly  known as:  DULERA Inhale 2 puffs into the lungs 2 (two) times daily. Notes to patient:  Taken today at 6am, 8am and 11am   mometasone-formoterol 200-5 MCG/ACT Aero Commonly known as:  DULERA Inhale 2 puffs into the lungs 2 (two) times daily.   nicotine 14 mg/24hr patch Commonly known as:  NICODERM CQ - dosed in mg/24 hours Place 14 mg onto the skin 3 (three) times a week.   omalizumab 150 MG injection Commonly known as:  XOLAIR Inject 150 mg into the skin every 14 (fourteen) days. Notes to patient:  Every 14 days   predniSONE 20 MG tablet Commonly known as:  DELTASONE Take 80mg  x 3 days, then decrease by 10mg  every 3 days Notes to patient:  Take as directed   sertraline 50 MG tablet Commonly known as:  ZOLOFT Take 1 tablet (50 mg total) by mouth daily.   STOOL SOFTENER 100 MG capsule Generic drug:  docusate sodium Take 100 mg by mouth 2 (two) times daily as needed for mild constipation.   theophylline 300 MG 12 hr tablet Commonly known as:  THEODUR Take 1 tablet (300 mg total) by mouth daily.       Disposition and follow-up:   April [name] was discharged from [facility] in Good condition.  At the hospital follow up visit please address:  1.  Please ensure the patient has all of her asthma medications and is taking them as prescribed.  2.  Labs / imaging needed at time of follow-up: Theophylline level to ensure she is within therapeutic range  3.  Pending labs/ test needing follow-up: None  Follow-up Appointments: Follow-up Information    [facility] .   Why:  Wednesday 8/30 at 10:30am  Contact information: [place] [phone]       [facility] .   Specialty:  Pulmonology Why:  Friday 8/25 at 10am with Dr. Jolayne [name] information: [facility] Course by problem list:    1. Asthma exacerbation [name] presented to the [facility] emergency department on 12/19/2015 with an asthma exacerbation. She has had multiple hospitalizations over the last several years for asthma exacerbations. At the time of admission she states that her symptoms have been worsening for the past several weeks but acutely worsened the day prior to arrival to the emergency department. She stated that she had been taking her medications including her inhalers, theophylline and receiving Xolair injections as prescribed. Upon arrival to the emergency department the patient was afebrile, tachycardic to 121, tachypnic with respiratory rate of 22 and hypertensive with a blood pressure 160/79. She was satting 100% on nonrebreather mask at 8 L. A chest x-ray showed no acute cardiopulmonary abnormality and labs were significant for a leukocytosis of 20.7, hemoglobin of 10.7 and hypokalemia of 3.0. Upon admission she was started on all of her home asthma medications, received scheduled DuoNeb every 4 hours with when necessary albuterol every 2 hours. She was also started on Solu-Medrol 60 mg twice a day. She recovered well over her hospitalization and her respiratory status improved. By the time of discharge on 12/21/2015 she stated that she was feeling almost at her baseline. At the time of discharge she was satting well on room air and her pulmonary exam had improved although she still had diffuse bilateral wheezing. She was discharged on all her home asthma medications and a prednisone taper starting at 80 mg and decreasing by 10 mg every 3 days. Additionally, she was discharged with extremely close follow-up appointments with her pulmonologist and primary care physician. She had a appointment scheduled with her pulmonologist for 12/22/2015 which she said she would attend this appointment. For her asthma she was discharged on the following medicines-fluticasone nasal spray 2 sprays per day, ipratropium-albuterol  nebulizer every 4 hours as needed, prednisone 80 mg orally with a tapered schedule, Dulera 2 puffs twice daily and theophylline 300 mg every 12 hours. At the time of discharge she was afebrile, hemodynamically stable and no respiratory distress and stable for discharge with close follow-up with her pulmonologist.  2. Hypertension Patient has a long history of hypertension and obesity. At time of discharge her blood pressure was 162/96. Overall, her pressures did well given the amount of steroids and albuterol she was given during her inpatient stay. She will be discharged home on her current antihypertensive medication regimen. She is to continue hydrochlorothiazide 25 mg once daily. Additionally, when she sees her primary care physician I think she would benefit from the addition of a second hypertensive medication if she is currently not at her goal blood pressure.  Discharge Vitals:   BP (!) 162/96 (BP Location: Left Arm)   Pulse 90   Temp 98.2 F (36.8 C) (Oral)   Resp 20   Ht 5\' 5"  (1.651 m)   Wt [name])  353 lb 6.4 oz (160.3 kg)   LMP 11/17/2015   SpO2 98%   BMI 58.81 kg/m   Pertinent Labs, Studies, and Procedures:  Chest x-ray demonstrating no acute cardiopulmonary abnormality  Discharge Instructions: Please continue all of your asthma medications. Additionally, please ensure you follow up with your pulmonologist and primary care physician. Please avoid allergens or other irritants that may increase the frequency of your asthma exacerbations.   Signed: Ophelia Shoulder, MD 12/22/2015, 1:54 PM   Pager: [phone]

## 2015-12-25 ENCOUNTER — Telehealth: Payer: Self-pay | Admitting: Internal Medicine

## 2015-12-25 NOTE — Telephone Encounter (Signed)
#   vials:4 Ordered date:12/25/15 Shipping Date:12/26/15

## 2015-12-25 NOTE — Telephone Encounter (Signed)
Our wires got crossed med. Was not shipped. Called 12/25/15 and set up delivery for 12/27/15.

## 2015-12-26 ENCOUNTER — Encounter: Payer: Self-pay | Admitting: Adult Health

## 2015-12-26 ENCOUNTER — Ambulatory Visit (INDEPENDENT_AMBULATORY_CARE_PROVIDER_SITE_OTHER): Payer: Self-pay | Admitting: Adult Health

## 2015-12-26 ENCOUNTER — Ambulatory Visit: Payer: Self-pay

## 2015-12-26 DIAGNOSIS — I10 Essential (primary) hypertension: Secondary | ICD-10-CM

## 2015-12-26 DIAGNOSIS — G4733 Obstructive sleep apnea (adult) (pediatric): Secondary | ICD-10-CM

## 2015-12-26 DIAGNOSIS — J45901 Unspecified asthma with (acute) exacerbation: Secondary | ICD-10-CM

## 2015-12-26 MED ORDER — PREDNISONE 20 MG PO TABS: ORAL_TABLET | ORAL | 0 refills | Status: DC

## 2015-12-26 MED ORDER — LEVALBUTEROL HCL 0.63 MG/3ML IN NEBU
0.6300 mg | INHALATION_SOLUTION | Freq: Once | RESPIRATORY_TRACT | Status: AC
  Administered 2015-12-26: 0.63 mg via RESPIRATORY_TRACT

## 2015-12-26 MED FILL — predniSONE 20 MG TABS: 20 | 27 days supply | Qty: 40 | Fill #0

## 2015-12-26 NOTE — Progress Notes (Signed)
Subjective:    Patient ID: April [name], female    DOB: 1970/09/05, 45 y.o.   MRN: [id number]  HPI 45 year old female former smoker followed for chronic severe asthma , obstructive sleep apnea on C Pap. She is on Xolair.   12/26/2015  Patient returns for a post hospital follow-up. Patient was recently admitted August 22 of August 25 for asthma exacerbation. Patient is followed by Dr. Annamaria Boots for chronic steroid dependent asthma, aggravated by obesity hypoventilation syndrome, deconditioning and medication noncompliance Patient has multiple hospitalizations for asthma exacerbations. Treated with nebulized bronchodilators, steroids. He is charged on high-dose steroids at 80 mg decreasing by 10 mg every 3 days. She has run out of prednisone , needs rx sent to pharm  Since discharge. Patient is still having wheeizng and cough .  Remains on Xolair , Dulera, Theodur,  Does not have any insurance . Gets meds thru [facility] when she can , has an appointment with them tomorrow.  Does not have a CPAP any longer. She took the one from hospital and she moved recently and left at her old place. Discussed possible options but she says she has no money at all.  Says she is not smoking now, last cig was 2 months.  Pt education on steroid effects and medication compliance.   Past Medical History:  Diagnosis Date  . Acanthosis nigricans   . Arthritis   . Asthma    Followed by Dr. Annamaria Boots (pulmonology); receives every other week omalizumab injections; has frequent exacerbations  . COPD (chronic obstructive pulmonary disease) ([place])    PFTs in 2002, FEV1/FVC 65, no post bronchodilater test done  . Depression   . GERD (gastroesophageal reflux disease)   . Headache(784.0)   . Helicobacter pylori (H. pylori) infection   . Hypertension   . Hypertension, essential   . Insomnia   . Menorrhagia   . Morbid obesity ([place])   . Obesity   . Seasonal allergies   . Shortness of breath   . Sleep  apnea    Sleep study 2008 - mild OSA, not enough events to titrate CPAP  . Tobacco user    Current Outpatient Prescriptions on File Prior to Visit  Medication Sig Dispense Refill  . acetaminophen (TYLENOL) 500 MG tablet Take 2 tablets (1,000 mg total) by mouth every 6 (six) hours as needed. (Patient taking differently: Take 1,000 mg by mouth every 6 (six) hours as needed for mild pain. ) 90 tablet 3  . albuterol (PROVENTIL) (2.5 MG/3ML) 0.083% nebulizer solution Take 3 mLs (2.5 mg total) by nebulization every 6 (six) hours as needed for wheezing or shortness of breath. (Patient taking differently: Take 2.5 mg by nebulization every 2 (two) hours as needed for wheezing or shortness of breath. ) 150 mL 1  . docusate sodium (STOOL SOFTENER) 100 MG capsule Take 100 mg by mouth 2 (two) times daily as needed for mild constipation.     [name] esomeprazole (NEXIUM) 40 MG capsule Take 40 mg by mouth daily.  3  . fluticasone (FLONASE) 50 MCG/ACT nasal spray Place 2 sprays into both nostrils daily.    [name] guaiFENesin (MUCINEX) 600 MG 12 hr tablet Take 1 tablet (600 mg total) by mouth 2 (two) times daily as needed. 60 tablet 0  . hydrochlorothiazide (HYDRODIURIL) 25 MG tablet Take 1 tablet (25 mg total) by mouth daily. 90 tablet 3  . ibuprofen (ADVIL,MOTRIN) 200 MG tablet Take 800 mg by mouth 2 (two) times daily as  needed for moderate pain (knee pain).    [name] ipratropium (ATROVENT) 0.02 % nebulizer solution Take 2.5 mLs (0.5 mg total) by nebulization 4 (four) times daily as needed for wheezing or shortness of breath. 75 mL 2  . loratadine (CLARITIN) 10 MG tablet Take 1 tablet (10 mg total) by mouth daily. 90 tablet 3  . mometasone-formoterol (DULERA) 200-5 MCG/ACT AERO Inhale 2 puffs into the lungs 2 (two) times daily. 13 g 1  . omalizumab (XOLAIR) 150 MG injection Inject 150 mg into the skin every 14 (fourteen) days.    . predniSONE (DELTASONE) 20 MG tablet Take 80mg  x 3 days, then decrease by 10mg  every 3 days 110  tablet 0  . sertraline (ZOLOFT) 50 MG tablet Take 1 tablet (50 mg total) by mouth daily. 90 tablet 3  . theophylline (THEODUR) 300 MG 12 hr tablet Take 1 tablet (300 mg total) by mouth daily. 90 tablet 3  . VENTOLIN HFA 108 (90 Base) MCG/ACT inhaler INHALE 1-2 PUFFS INTO THE LUNGS EVERY 4 HOURS. 18 g 5  . nicotine (NICODERM CQ - DOSED IN MG/24 HOURS) 14 mg/24hr patch Place 14 mg onto the skin 3 (three) times a week.     Current Facility-Administered Medications on File Prior to Visit  Medication Dose Route Frequency Provider Last Rate Last Dose  . omalizumab Arvid Right) injection 225 mg  225 mg Subcutaneous Q14 Days Deneise Lever, MD   225 mg at 11/28/15 1635      Review of Systems Constitutional:   No  weight loss, night sweats,  Fevers, chills,  +fatigue, or  lassitude.  HEENT:   No headaches,  Difficulty swallowing,  Tooth/dental problems, or  Sore throat,                No sneezing, itching, ear ache,  +nasal congestion, post nasal drip,   CV:  No chest pain,  Orthopnea, PND, swelling in lower extremities, anasarca, dizziness, palpitations, syncope.   GI  No heartburn, indigestion, abdominal pain, nausea, vomiting, diarrhea, change in bowel habits, loss of appetite, bloody stools.   Resp:    No chest wall deformity  Skin: no rash or lesions.  GU: no dysuria, change in color of urine, no urgency or frequency.  No flank pain, no hematuria   MS:  No joint pain or swelling.  No decreased range of motion.  No back pain.  Psych:  No change in mood or affect. No depression or anxiety.  No memory loss.         Objective:   Physical Exam Vitals:   12/26/15 0915  Pulse: 96  Temp: 97.8 F (36.6 C)  TempSrc: Oral  SpO2: 98%  Weight: (!) 345 lb (156.5 kg)  Height: 5\' 6"  (1.676 m)  Body mass index is 55.68 kg/m.  GEN: A/Ox3; pleasant , NAD, morbidly obese    HEENT:  [place]/AT,  EACs-clear, TMs-wnl, NOSE-clear, THROAT-clear, no lesions, no postnasal drip or exudate noted.    NECK:  Supple w/ fair ROM; no JVD; normal carotid impulses w/o bruits; no thyromegaly or nodules palpated; no lymphadenopathy.    RESP  Few exp wheezes, +psuedowheezing in upper airway , no accessory muscle use, no dullness to percussion, speaks in full sentences   CARD:  RRR, no m/r/g  , no peripheral edema, pulses intact, no cyanosis or clubbing.  GI:   Soft & nt; nml bowel sounds; no organomegaly or masses detected.   Musco: Warm bil, no deformities or joint swelling noted.  Neuro: alert, no focal deficits noted.    Skin: Warm, no lesions or rashes     Tammy Parrett NP-C  [facility]  12/26/2015

## 2015-12-26 NOTE — Assessment & Plan Note (Signed)
Recurrent exacerbation complicated by obesity , GERD , med noncompliance, limited finance and lack of insurance .  Long discussion regarding her frequent steroid use at high doses and frequent hospitalization  Will have her in close follow up , every 1 week for now Taper prednisone to lowest effective dose.   Plan  Patient Instructions  Taper Prednisone 50mg  on Wednesday (12/27/15) , then 40mg  beginning 12/28/15 take daly for 1 week then 30mg  daily for 1 week then 20mg  daily and hold at this dose.  Hold Theodur for now.  Begin sample  Symbicort 2 puffs Twice daily   Begin sample Spiriva Respimat 2 puffs daily  Rinse after inhalers.  Begin Zyrtec or Claritin 10mg  At bedtime  .  Begin Sample nexium 40mg  daily  .  Follow up with [facility]  tomorrow as planned  Follow up with Dr. Annamaria Boots  Or Azarie Coriz in 1 week  Please contact office for sooner follow up if symptoms do not improve or worsen or seek emergency care

## 2015-12-26 NOTE — Addendum Note (Signed)
Addended by: Osa Craver on: 12/26/2015 10:17 AM   Modules accepted: Orders

## 2015-12-26 NOTE — Assessment & Plan Note (Signed)
Follow up with [facility] tomorrow as planned  Avoid ACE inhibitors if possible .

## 2015-12-26 NOTE — Patient Instructions (Addendum)
Taper Prednisone 50mg  on Wednesday (12/27/15) , then 40mg  beginning 12/28/15 take daly for 1 week then 30mg  daily for 1 week then 20mg  daily and hold at this dose.  Hold Theodur for now.  Begin sample  Symbicort 2 puffs Twice daily   Begin sample Spiriva Respimat 2 puffs daily  Rinse after inhalers.  Begin Zyrtec or Claritin 10mg  At bedtime  .  Begin Sample nexium 40mg  daily  .  Follow up with [facility]  tomorrow as planned  Follow up with Dr. Annamaria Boots  Or Parrett in 1 week  Please contact office for sooner follow up if symptoms do not improve or worsen or seek emergency care

## 2015-12-26 NOTE — Assessment & Plan Note (Signed)
Not able to get CPAP at this time due finance.  Only mild OSA in 2008 , may need new sleep study going forward.

## 2015-12-26 NOTE — Addendum Note (Signed)
Addended by: Osa Craver on: 12/26/2015 12:39 PM   Modules accepted: Orders

## 2015-12-27 ENCOUNTER — Encounter: Payer: Self-pay | Admitting: Critical Care Medicine

## 2015-12-27 ENCOUNTER — Ambulatory Visit: Payer: Self-pay | Attending: Critical Care Medicine | Admitting: Critical Care Medicine

## 2015-12-27 VITALS — HR 108 | Temp 99.0°F | Resp 20 | Ht 66.0 in | Wt 349.0 lb

## 2015-12-27 DIAGNOSIS — J4551 Severe persistent asthma with (acute) exacerbation: Secondary | ICD-10-CM | POA: Insufficient documentation

## 2015-12-27 DIAGNOSIS — K219 Gastro-esophageal reflux disease without esophagitis: Secondary | ICD-10-CM | POA: Insufficient documentation

## 2015-12-27 DIAGNOSIS — Z79899 Other long term (current) drug therapy: Secondary | ICD-10-CM | POA: Insufficient documentation

## 2015-12-27 DIAGNOSIS — I10 Essential (primary) hypertension: Secondary | ICD-10-CM | POA: Insufficient documentation

## 2015-12-27 DIAGNOSIS — J449 Chronic obstructive pulmonary disease, unspecified: Secondary | ICD-10-CM | POA: Insufficient documentation

## 2015-12-27 DIAGNOSIS — J45901 Unspecified asthma with (acute) exacerbation: Secondary | ICD-10-CM

## 2015-12-27 DIAGNOSIS — Z87891 Personal history of nicotine dependence: Secondary | ICD-10-CM | POA: Insufficient documentation

## 2015-12-27 MED ORDER — IPRATROPIUM-ALBUTEROL 0.5-2.5 (3) MG/3ML IN SOLN
3.0000 mL | Freq: Once | RESPIRATORY_TRACT | Status: AC
  Administered 2015-12-27: 3 mL via RESPIRATORY_TRACT

## 2015-12-27 MED ORDER — ALBUTEROL SULFATE HFA 108 (90 BASE) MCG/ACT IN AERS: INHALATION_SPRAY | RESPIRATORY_TRACT | 5 refills | Status: DC

## 2015-12-27 MED ORDER — PREDNISONE 20 MG PO TABS: ORAL_TABLET | ORAL | 0 refills | Status: DC

## 2015-12-27 MED ORDER — BUDESONIDE-FORMOTEROL FUMARATE 160-4.5 MCG/ACT IN AERO: 2.0000 | INHALATION_SPRAY | Freq: Two times a day (BID) | RESPIRATORY_TRACT | 12 refills | Status: DC

## 2015-12-27 MED ORDER — FLUTICASONE PROPIONATE 50 MCG/ACT NA SUSP: 2.0000 | Freq: Every day | NASAL | 6 refills | Status: DC

## 2015-12-27 MED ORDER — IPRATROPIUM BROMIDE 0.02 % IN SOLN: 0.5000 mg | Freq: Four times a day (QID) | RESPIRATORY_TRACT | 2 refills | Status: DC | PRN

## 2015-12-27 MED ORDER — AZITHROMYCIN 250 MG PO TABS: 250.0000 mg | ORAL_TABLET | Freq: Every day | ORAL | 0 refills | Status: DC

## 2015-12-27 MED ORDER — ALBUTEROL SULFATE (2.5 MG/3ML) 0.083% IN NEBU: 2.5000 mg | INHALATION_SOLUTION | RESPIRATORY_TRACT | 6 refills | Status: DC | PRN

## 2015-12-27 MED ORDER — TIOTROPIUM BROMIDE MONOHYDRATE 2.5 MCG/ACT IN AERS: 2.0000 | INHALATION_SPRAY | Freq: Every day | RESPIRATORY_TRACT | 6 refills | Status: DC

## 2015-12-27 MED ORDER — HYDROCHLOROTHIAZIDE 25 MG PO TABS: 25.0000 mg | ORAL_TABLET | Freq: Every day | ORAL | 3 refills | Status: DC

## 2015-12-27 MED FILL — IPRATROPIUM BR 0.02% SOLN: 0.02 | 30 days supply | Qty: 188 | Fill #0

## 2015-12-27 MED FILL — HYDROCHLOROTHIAZIDE 25 MG T: 25 | 30 days supply | Qty: 30 | Fill #0

## 2015-12-27 MED FILL — VENTOLIN HFA 90 MCG INHALER: 108 (90 BAS | 30 days supply | Qty: 18 | Fill #0

## 2015-12-27 MED FILL — SPIRIVA RESPIMAT INHAL SPRY: 2.5 | 30 days supply | Qty: 4 | Fill #0

## 2015-12-27 MED FILL — SYMBICORT 160-4.5 MCG INH: 160-4.5 | 30 days supply | Qty: 10 | Fill #0

## 2015-12-27 MED FILL — AZITHROMYCIN 250 MG TABLET: 250 | 5 days supply | Qty: 6 | Fill #0

## 2015-12-27 MED FILL — ALBUTEROL 0.083% INHAL SOLN: (2.5 MG/3ML | 10 days supply | Qty: 180 | Fill #0

## 2015-12-27 MED FILL — FLUTICASONE PROP 50 MCG SPR: 50 | 20 days supply | Qty: 16 | Fill #0

## 2015-12-27 NOTE — Patient Instructions (Addendum)
REsume prednisone 10mg  Take 4 for four days 3 for four days 2 for four days 1 for four days  (sent to pharmacy) Stay on all inhalers, all refills sent to our pharmacy, symbicort increased to 160 strength, two puff twice daily Work on inhaler technique, use spacer Take azithromycin 250mg  Take two once then one daily until gone sent to pharmacy Return 2 weeks

## 2015-12-27 NOTE — Assessment & Plan Note (Signed)
Severe persistent asthma with flare Plan REsume prednisone 10mg  Take 4 for four days 3 for four days 2 for four days 1 for four days  (sent to pharmacy) Stay on all inhalers, all refills sent to our pharmacy, symbicort increased to 160 strength, two puff twice daily Work on inhaler technique, use spacer Take azithromycin 250mg  Take two once then one daily until gone sent to pharmacy Return 2 weeks

## 2015-12-27 NOTE — Telephone Encounter (Signed)
#   Vials:4 Arrival Date:12/27/15 Lot [id number] Exp Date:3/21

## 2015-12-27 NOTE — Progress Notes (Signed)
Subjective:    Patient ID: April [name], female    DOB: 08/06/1970, 45 y.o.   MRN: [id number]  Here for HFU for asthma exacerbation pt admitted 8/22 - 8/25.   Pt on Xolair and dulera. Sees Dr Glynn Octave for allergy care.  45 y.o. yoF former smoker followed for chronic severe asthma/ Xolair complicated by OSA/ CPAP,  morbid obesity, HBP, depression, GERD  Not much improvement to date.  Hx of extensive allergies and triggers.    Asthma  She complains of chest tightness, cough, difficulty breathing, frequent throat clearing, hemoptysis, hoarse voice, shortness of breath, sputum production and wheezing. Primary symptoms comments: Still dyspneic at rest weak. This is a recurrent problem. The current episode started 1 to 4 weeks ago. The problem occurs constantly. The problem has been gradually worsening. The cough is productive, productive of sputum and productive of purulent sputum. Associated symptoms include appetite change, chest pain, dyspnea on exertion, headaches, malaise/fatigue, PND, postnasal drip, rhinorrhea, a sore throat and trouble swallowing. Pertinent negatives include no ear congestion, ear pain, fever, heartburn, nasal congestion or sneezing. Associated symptoms comments: Hx of GERD, not an issue. Her symptoms are aggravated by any activity, change in weather, climbing stairs and pollen. Her symptoms are not alleviated by beta-agonist. Her past medical history is significant for asthma. There is no history of pneumonia.    Past Medical History:  Diagnosis Date  . Acanthosis nigricans   . Arthritis   . Asthma    Followed by Dr. Annamaria Boots (pulmonology); receives every other week omalizumab injections; has frequent exacerbations  . COPD (chronic obstructive pulmonary disease) ([place])    PFTs in 2002, FEV1/FVC 65, no post bronchodilater test done  . Depression   . GERD (gastroesophageal reflux disease)   . Headache(784.0)   . Helicobacter pylori (H. pylori) infection   . Hypertension   .  Hypertension, essential   . Insomnia   . Menorrhagia   . Morbid obesity ([place])   . Obesity   . Seasonal allergies   . Shortness of breath   . Sleep apnea    Sleep study 2008 - mild OSA, not enough events to titrate CPAP  . Tobacco user      Family History  Problem Relation Age of Onset  . Hypertension Mother   . Asthma Daughter   . Cancer Paternal Aunt   . Asthma Maternal Grandmother      Social History   Social History  . Marital status: Single    Spouse name: N/A  . Number of children: 1  . Years of education: N/A   Occupational History  . [place]   Social History Main Topics  . Smoking status: Former Smoker    Years: 18.00    Types: Cigarettes    Quit date: 09/15/2014  . Smokeless tobacco: Former Systems developer    Quit date: 09/12/2014  . Alcohol use No  . Drug use: No  . Sexual activity: Not on file   Other Topics Concern  . 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      No Known Allergies   Outpatient Medications Prior to Visit  Medication Sig Dispense Refill  . acetaminophen (TYLENOL) 500 MG tablet Take 2 tablets (1,000 mg total) by mouth every 6 (six) hours as needed. (Patient taking differently: Take 1,000  mg by mouth every 6 (six) hours as needed for mild pain. ) 90 tablet 3  . docusate sodium (STOOL SOFTENER) 100 MG capsule Take 100 mg by mouth 2 (two) times daily as needed for mild constipation.     [name] esomeprazole (NEXIUM) 40 MG capsule Take 40 mg by mouth daily.  3  . guaiFENesin (MUCINEX) 600 MG 12 hr tablet Take 1 tablet (600 mg total) by mouth 2 (two) times daily as needed. 60 tablet 0  . ibuprofen (ADVIL,MOTRIN) 200 MG tablet Take 800 mg by mouth 2 (two) times daily as needed for moderate pain (knee pain).    [name] loratadine (CLARITIN) 10 MG tablet Take 1 tablet (10 mg total) by mouth daily.  90 tablet 3  . omalizumab (XOLAIR) 150 MG injection Inject 150 mg into the skin every 14 (fourteen) days.    [name] sertraline (ZOLOFT) 50 MG tablet Take 1 tablet (50 mg total) by mouth daily. 90 tablet 3  . albuterol (PROVENTIL) (2.5 MG/3ML) 0.083% nebulizer solution Take 3 mLs (2.5 mg total) by nebulization every 6 (six) hours as needed for wheezing or shortness of breath. (Patient taking differently: Take 2.5 mg by nebulization every 2 (two) hours as needed for wheezing or shortness of breath. ) 150 mL 1  . budesonide-formoterol (SYMBICORT) 80-4.5 MCG/ACT inhaler Inhale 2 puffs into the lungs 2 (two) times daily.    . fluticasone (FLONASE) 50 MCG/ACT nasal spray Place 2 sprays into both nostrils daily.    . hydrochlorothiazide (HYDRODIURIL) 25 MG tablet Take 1 tablet (25 mg total) by mouth daily. 90 tablet 3  . ipratropium (ATROVENT) 0.02 % nebulizer solution Take 2.5 mLs (0.5 mg total) by nebulization 4 (four) times daily as needed for wheezing or shortness of breath. 75 mL 2  . predniSONE (DELTASONE) 20 MG tablet 50mg  on Wednesday (12/27/15) , then 40mg  12/28/15 take daly for 1 week then 30mg  daily for 1 week then 20mg  daily and hold at this dose 40 tablet 0  . Tiotropium Bromide Monohydrate (SPIRIVA RESPIMAT) 2.5 MCG/ACT AERS Inhale 2 puffs into the lungs daily.    . VENTOLIN HFA 108 (90 Base) MCG/ACT inhaler INHALE 1-2 PUFFS INTO THE LUNGS EVERY 4 HOURS. 18 g 5  . nicotine (NICODERM CQ - DOSED IN MG/24 HOURS) 14 mg/24hr patch Place 14 mg onto the skin 3 (three) times a week.    . theophylline (THEODUR) 300 MG 12 hr tablet Take 1 tablet (300 mg total) by mouth daily. (Patient not taking: Reported on 12/27/2015) 90 tablet 3   Facility-Administered Medications Prior to Visit  Medication Dose Route Frequency Provider Last Rate Last Dose  . omalizumab Arvid Right) injection 225 mg  225 mg Subcutaneous Q14 Days Deneise Lever, MD   225 mg at 11/28/15 1635     Review of Systems  Constitutional: Positive  for appetite change and malaise/fatigue. Negative for fever.  HENT: Positive for hoarse voice, postnasal drip, rhinorrhea, sore throat and trouble swallowing. Negative for ear pain and sneezing.   Respiratory: Positive for cough, hemoptysis, sputum production, shortness of breath and wheezing.   Cardiovascular: Positive for chest pain, dyspnea on exertion and PND.  Gastrointestinal: Negative for heartburn.  Neurological: Positive for headaches.       Objective:   Physical Exam Vitals:   12/27/15 1021  Pulse: (!) 108  Resp: 20  Temp: 99 F (37.2 C)  TempSrc: Oral  SpO2: 99%  Weight: (!) 349 lb (158.3 kg)  Height: 5\' 6"  (1.676 m)  Gen: Pleasant, obese, cushingoid F , in no distress,  normal affect  ENT: No lesions,  mouth clear,  oropharynx clear, no postnasal drip  Neck: No JVD, no TMG, no carotid bruits  Lungs: No use of accessory muscles, no dullness to percussion, insp /exp wheeze   Cardiovascular: RRR, heart sounds normal, no murmur or gallops, no peripheral edema  Abdomen: soft and NT, no HSM,  BS normal  Musculoskeletal: No deformities, no cyanosis or clubbing  Neuro: alert, non focal  Skin: Warm, no lesions or rashes  No results found.        Assessment & Plan:  .I personally reviewed all images and lab data in the [facility] system as well as any outside material available during this office visit and agree with the  radiology impressions.   Asthma exacerbation Severe persistent asthma with flare Plan REsume prednisone 10mg  Take 4 for four days 3 for four days 2 for four days 1 for four days  (sent to pharmacy) Stay on all inhalers, all refills sent to our pharmacy, symbicort increased to 160 strength, two puff twice daily Work on inhaler technique, use spacer Take azithromycin 250mg  Take two once then one daily until gone sent to pharmacy Return 2 weeks    Ethelyn was seen today for asthma.  Diagnoses and all orders for this visit:  Severe persistent  asthma with exacerbation  Asthma, chronic obstructive, without status asthmaticus (HCC) -     albuterol (PROVENTIL) (2.5 MG/3ML) 0.083% nebulizer solution; Take 3 mLs (2.5 mg total) by nebulization every 4 (four) hours as needed for wheezing or shortness of breath. -     ipratropium (ATROVENT) 0.02 % nebulizer solution; Take 2.5 mLs (0.5 mg total) by nebulization 4 (four) times daily as needed for wheezing or shortness of breath. -     ipratropium-albuterol (DUONEB) 0.5-2.5 (3) MG/3ML nebulizer solution 3 mL; Take 3 mLs by nebulization once.  Essential hypertension -     hydrochlorothiazide (HYDRODIURIL) 25 MG tablet; Take 1 tablet (25 mg total) by mouth daily.  Asthma exacerbation  Other orders -     fluticasone (FLONASE) 50 MCG/ACT nasal spray; Place 2 sprays into both nostrils daily. -     Discontinue: predniSONE (DELTASONE) 20 MG tablet; 50mg  on Wednesday (12/27/15) , then 40mg  12/28/15 take daly for 1 week then 30mg  daily for 1 week then 20mg  daily and hold at this dose -     Tiotropium Bromide Monohydrate (SPIRIVA RESPIMAT) 2.5 MCG/ACT AERS; Inhale 2 puffs into the lungs daily. -     albuterol (VENTOLIN HFA) 108 (90 Base) MCG/ACT inhaler; INHALE 1-2 PUFFS INTO THE LUNGS EVERY 4 HOURS as needed -     budesonide-formoterol (SYMBICORT) 160-4.5 MCG/ACT inhaler; Inhale 2 puffs into the lungs 2 (two) times daily. -     predniSONE (DELTASONE) 20 MG tablet; Take 4 for four days 3 for four days 2 for four days 1 for four days -     azithromycin (ZITHROMAX) 250 MG tablet; Take 1 tablet (250 mg total) by mouth daily. Take two once then one daily until gone

## 2015-12-27 NOTE — Progress Notes (Signed)
Patient has taken medication today and patient has eaten.  Patient complains of nagging chest pain for the past few days due to coughing and difficulty breathing with asthma.  Patient declined the flu shot today.  Patient denies any suicidal ideations at this time.

## 2015-12-28 ENCOUNTER — Ambulatory Visit (INDEPENDENT_AMBULATORY_CARE_PROVIDER_SITE_OTHER): Payer: Self-pay

## 2015-12-28 ENCOUNTER — Ambulatory Visit: Payer: Self-pay | Admitting: Family Medicine

## 2015-12-28 DIAGNOSIS — J4520 Mild intermittent asthma, uncomplicated: Secondary | ICD-10-CM

## 2015-12-28 MED ORDER — OMALIZUMAB 150 MG ~~LOC~~ SOLR
225.0000 mg | SUBCUTANEOUS | Status: DC
  Administered 2015-12-28: 225 mg via SUBCUTANEOUS

## 2016-01-03 ENCOUNTER — Ambulatory Visit: Payer: Self-pay | Admitting: Adult Health

## 2016-01-15 ENCOUNTER — Telehealth: Payer: Self-pay | Admitting: Internal Medicine

## 2016-01-15 ENCOUNTER — Ambulatory Visit (INDEPENDENT_AMBULATORY_CARE_PROVIDER_SITE_OTHER): Payer: Self-pay

## 2016-01-15 DIAGNOSIS — J4540 Moderate persistent asthma, uncomplicated: Secondary | ICD-10-CM

## 2016-01-15 MED ORDER — OMALIZUMAB 150 MG ~~LOC~~ SOLR
225.0000 mg | Freq: Once | SUBCUTANEOUS | Status: AC
  Administered 2016-01-15: 225 mg via SUBCUTANEOUS

## 2016-01-15 MED ORDER — PREDNISONE 20 MG PO TABS
ORAL_TABLET | ORAL | 3 refills | Status: DC
Start: 2016-01-15 — End: 2016-02-28

## 2016-01-15 NOTE — Telephone Encounter (Signed)
Symbicort samples not available. OK to give Dulera 200 per CY.  Spoke with pt and advised of samples left at front desk for her to try. Refill on oral pred sent to pharmacy. Nothing further needed.

## 2016-01-15 NOTE — Telephone Encounter (Signed)
Ok to refill prednisone with 3 refills  Ok to give sample Symbicort 160, inhale 2 puffs then rinse mouth, twice daily

## 2016-01-15 NOTE — Telephone Encounter (Signed)
Spoke with pt while she was in allergy lab. She is requesting refill on albuterol HFA and prednisone taper. Pt c/o increasing ShOB and wheeze since stopping prednisone. She would like to continue to take.   Per [facility] pt cannot pick up rx for albuterol HFA for 11 more days.   Pt would like prednisone sent to [facility].   CY - Please advise as to prednisone refill. Thanks!   No Known Allergies  Current Outpatient Prescriptions on File Prior to Visit  Medication Sig Dispense Refill  . acetaminophen (TYLENOL) 500 MG tablet Take 2 tablets (1,000 mg total) by mouth every 6 (six) hours as needed. (Patient taking differently: Take 1,000 mg by mouth every 6 (six) hours as needed for mild pain. ) 90 tablet 3  . albuterol (PROVENTIL) (2.5 MG/3ML) 0.083% nebulizer solution Take 3 mLs (2.5 mg total) by nebulization every 4 (four) hours as needed for wheezing or shortness of breath. 150 mL 6  . albuterol (VENTOLIN HFA) 108 (90 Base) MCG/ACT inhaler INHALE 1-2 PUFFS INTO THE LUNGS EVERY 4 HOURS as needed 18 g 5  . azithromycin (ZITHROMAX) 250 MG tablet Take 1 tablet (250 mg total) by mouth daily. Take two once then one daily until gone 6 tablet 0  . budesonide-formoterol (SYMBICORT) 160-4.5 MCG/ACT inhaler Inhale 2 puffs into the lungs 2 (two) times daily. 1 Inhaler 12  . docusate sodium (STOOL SOFTENER) 100 MG capsule Take 100 mg by mouth 2 (two) times daily as needed for mild constipation.     [name] esomeprazole (NEXIUM) 40 MG capsule Take 40 mg by mouth daily.  3  . fluticasone (FLONASE) 50 MCG/ACT nasal spray Place 2 sprays into both nostrils daily. 16 g 6  . guaiFENesin (MUCINEX) 600 MG 12 hr tablet Take 1 tablet (600 mg total) by mouth 2 (two) times daily as needed. 60 tablet 0  . hydrochlorothiazide (HYDRODIURIL) 25 MG tablet Take 1 tablet (25 mg total) by mouth daily. 90 tablet 3  . ibuprofen (ADVIL,MOTRIN) 200 MG tablet Take 800 mg by mouth 2 (two) times daily as needed for  moderate pain (knee pain).    [name] ipratropium (ATROVENT) 0.02 % nebulizer solution Take 2.5 mLs (0.5 mg total) by nebulization 4 (four) times daily as needed for wheezing or shortness of breath. 75 mL 2  . loratadine (CLARITIN) 10 MG tablet Take 1 tablet (10 mg total) by mouth daily. 90 tablet 3  . nicotine (NICODERM CQ - DOSED IN MG/24 HOURS) 14 mg/24hr patch Place 14 mg onto the skin 3 (three) times a week.    [name] omalizumab (XOLAIR) 150 MG injection Inject 150 mg into the skin every 14 (fourteen) days.    . predniSONE (DELTASONE) 20 MG tablet Take 4 for four days 3 for four days 2 for four days 1 for four days 40 tablet 0  . sertraline (ZOLOFT) 50 MG tablet Take 1 tablet (50 mg total) by mouth daily. 90 tablet 3  . Tiotropium Bromide Monohydrate (SPIRIVA RESPIMAT) 2.5 MCG/ACT AERS Inhale 2 puffs into the lungs daily. 1 Inhaler 6   Current Facility-Administered Medications on File Prior to Visit  Medication Dose Route Frequency Provider Last Rate Last Dose  . omalizumab Arvid Right) injection 225 mg  225 mg Subcutaneous Q14 Days Deneise Lever, MD   225 mg at 11/28/15 1635  . omalizumab Arvid Right) injection 225 mg  225 mg Subcutaneous Q14 Days Deneise Lever, MD   225 mg at 12/28/15 1156

## 2016-01-23 ENCOUNTER — Ambulatory Visit (INDEPENDENT_AMBULATORY_CARE_PROVIDER_SITE_OTHER): Payer: Self-pay

## 2016-01-23 DIAGNOSIS — Z23 Encounter for immunization: Secondary | ICD-10-CM

## 2016-01-24 IMAGING — DX DG CHEST 2V
2 series · 2 of 2 positions shown · non-contrast
Comparison: Prior radiograph from 11/21/2014.

CLINICAL DATA: Initial evaluation for acute shortness of breath,
wheezing.

EXAM:
CHEST  2 VIEW

[chest pa]
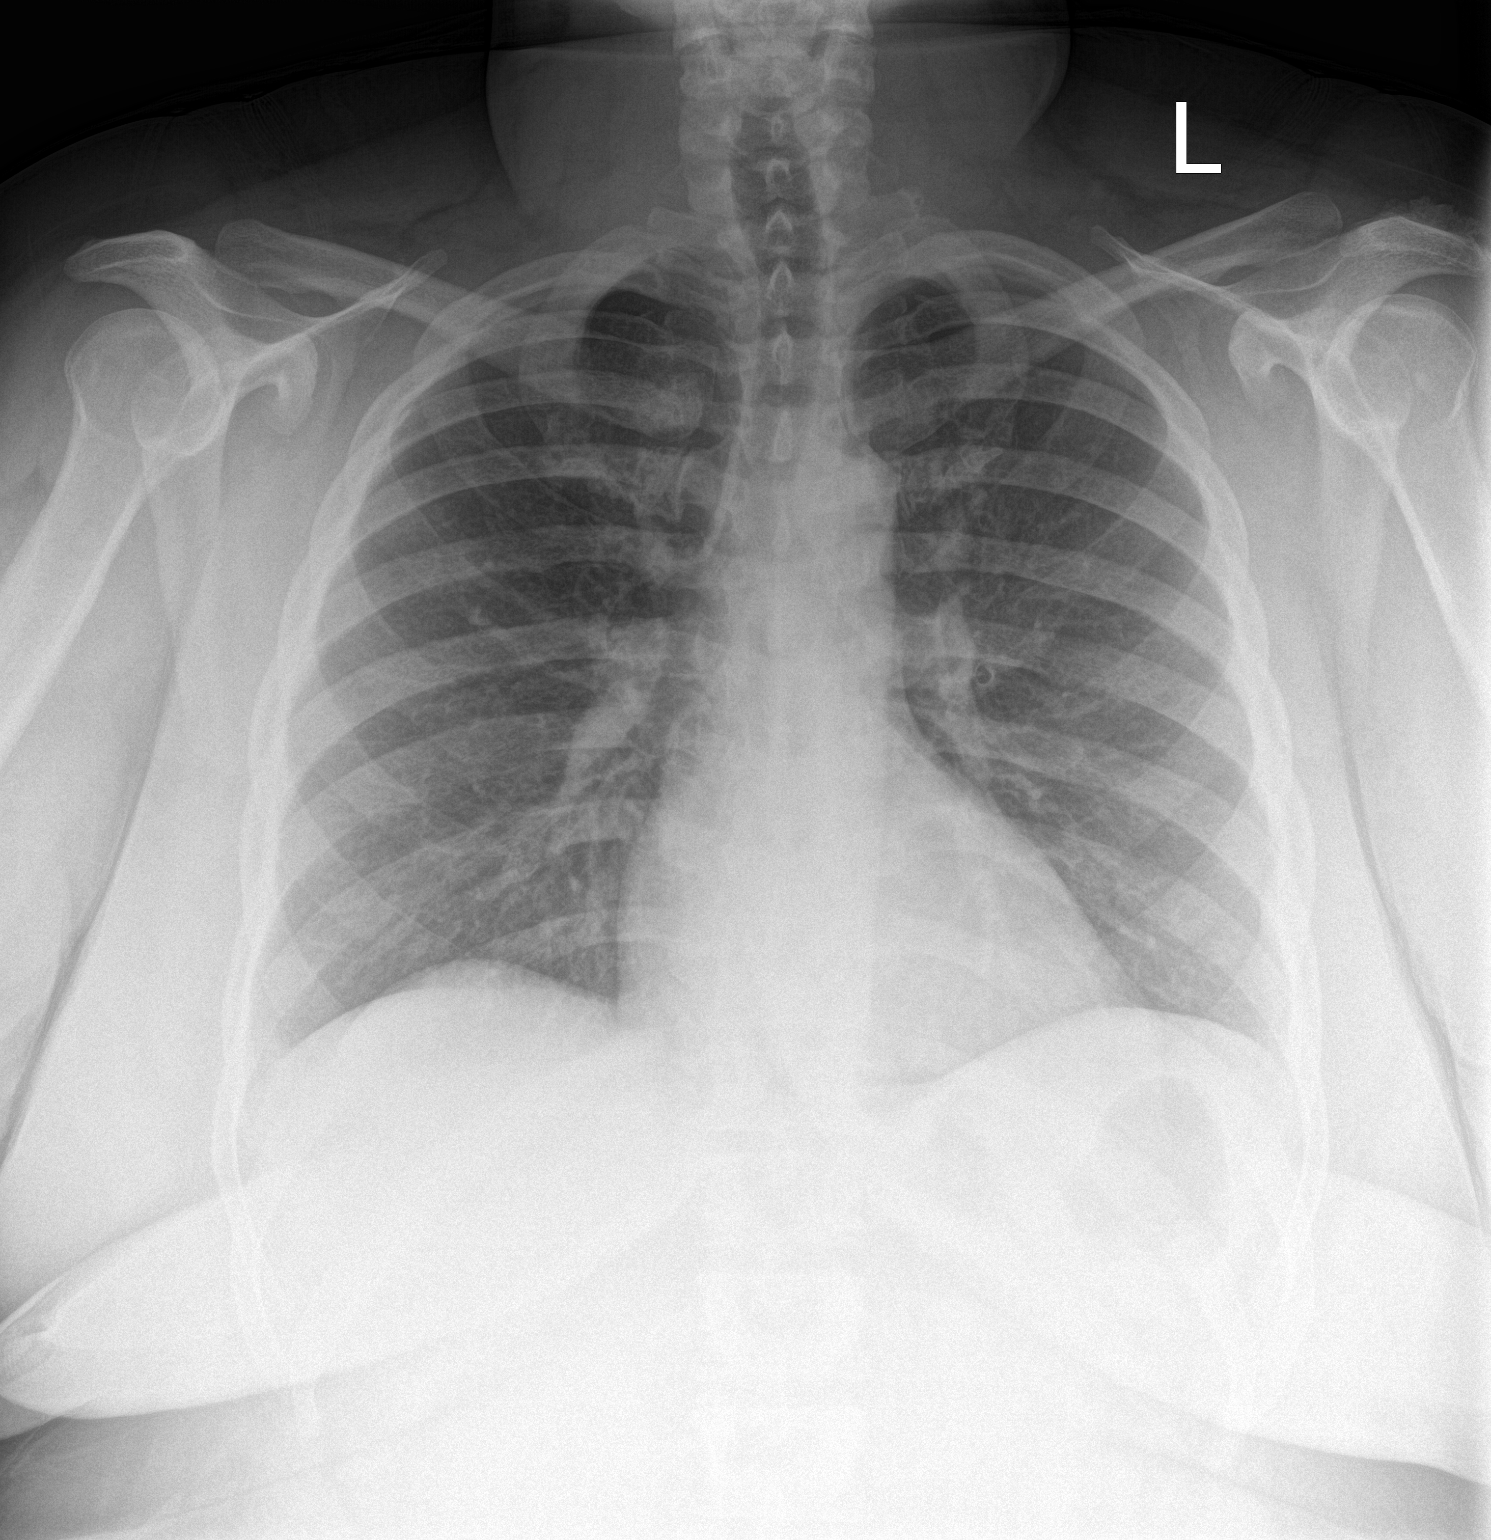

[chest lat]
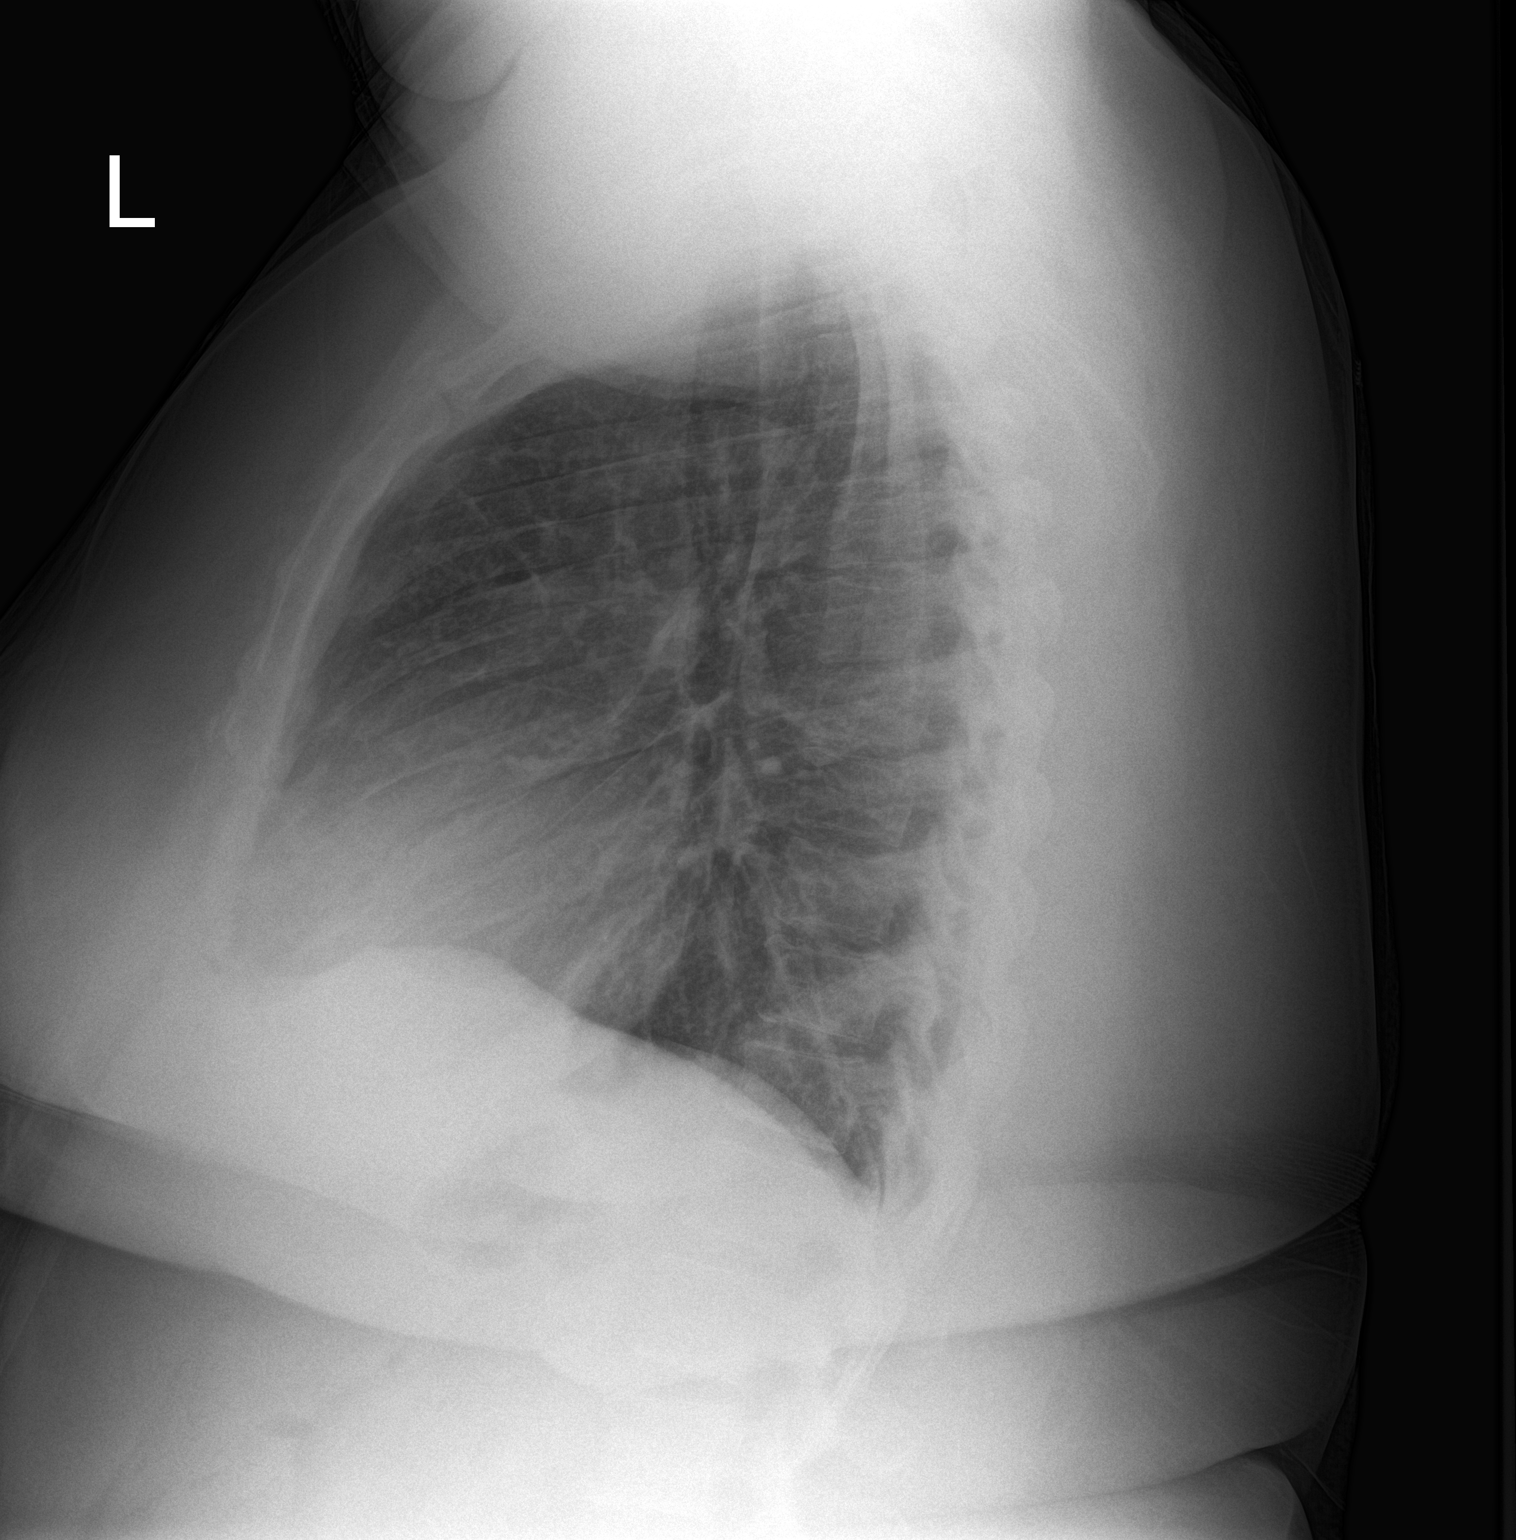

[2 of 2 positions shown; findings below may reference images not displayed]

FINDINGS: The cardiac and mediastinal silhouettes are stable in size and
contour, and remain within normal limits.

The lungs are normally inflated. Mild chronic bronchitic changes,
stable. No airspace consolidation, pleural effusion, or pulmonary
edema is identified. There is no pneumothorax.

No acute osseous abnormality identified.
IMPRESSION: Mild chronic bronchitic changes, similar to prior. No superimposed
acute cardiopulmonary disease.

## 2016-01-29 ENCOUNTER — Telehealth: Payer: Self-pay | Admitting: Internal Medicine

## 2016-01-29 NOTE — Telephone Encounter (Signed)
#   vials:4 Ordered date:01/29/16 Shipping Date:02/01/16

## 2016-01-30 ENCOUNTER — Inpatient Hospital Stay (HOSPITAL_COMMUNITY)
Admission: EM | Admit: 2016-01-30 | Discharge: 2016-01-31 | DRG: 202 | Disposition: A | Discharge status: Home / Self Care | Payer: Self-pay | Attending: Internal Medicine | Admitting: Internal Medicine

## 2016-01-30 ENCOUNTER — Ambulatory Visit (INDEPENDENT_AMBULATORY_CARE_PROVIDER_SITE_OTHER): Payer: Self-pay

## 2016-01-30 ENCOUNTER — Encounter (HOSPITAL_COMMUNITY): Payer: Self-pay | Admitting: Emergency Medicine

## 2016-01-30 ENCOUNTER — Emergency Department (HOSPITAL_COMMUNITY): Payer: Self-pay

## 2016-01-30 DIAGNOSIS — I10 Essential (primary) hypertension: Secondary | ICD-10-CM | POA: Diagnosis present

## 2016-01-30 DIAGNOSIS — K219 Gastro-esophageal reflux disease without esophagitis: Secondary | ICD-10-CM | POA: Diagnosis present

## 2016-01-30 DIAGNOSIS — J069 Acute upper respiratory infection, unspecified: Secondary | ICD-10-CM | POA: Diagnosis present

## 2016-01-30 DIAGNOSIS — M7989 Other specified soft tissue disorders: Secondary | ICD-10-CM

## 2016-01-30 DIAGNOSIS — E876 Hypokalemia: Secondary | ICD-10-CM | POA: Diagnosis present

## 2016-01-30 DIAGNOSIS — Z7951 Long term (current) use of inhaled steroids: Secondary | ICD-10-CM

## 2016-01-30 DIAGNOSIS — J449 Chronic obstructive pulmonary disease, unspecified: Secondary | ICD-10-CM | POA: Diagnosis present

## 2016-01-30 DIAGNOSIS — F339 Major depressive disorder, recurrent, unspecified: Secondary | ICD-10-CM | POA: Diagnosis present

## 2016-01-30 DIAGNOSIS — M1990 Unspecified osteoarthritis, unspecified site: Secondary | ICD-10-CM | POA: Diagnosis present

## 2016-01-30 DIAGNOSIS — Z7952 Long term (current) use of systemic steroids: Secondary | ICD-10-CM

## 2016-01-30 DIAGNOSIS — Z9989 Dependence on other enabling machines and devices: Secondary | ICD-10-CM

## 2016-01-30 DIAGNOSIS — F17201 Nicotine dependence, unspecified, in remission: Secondary | ICD-10-CM

## 2016-01-30 DIAGNOSIS — J4540 Moderate persistent asthma, uncomplicated: Secondary | ICD-10-CM

## 2016-01-30 DIAGNOSIS — G4733 Obstructive sleep apnea (adult) (pediatric): Secondary | ICD-10-CM | POA: Diagnosis present

## 2016-01-30 DIAGNOSIS — G4700 Insomnia, unspecified: Secondary | ICD-10-CM | POA: Diagnosis present

## 2016-01-30 DIAGNOSIS — Z8249 Family history of ischemic heart disease and other diseases of the circulatory system: Secondary | ICD-10-CM

## 2016-01-30 DIAGNOSIS — Z79899 Other long term (current) drug therapy: Secondary | ICD-10-CM

## 2016-01-30 DIAGNOSIS — Z87891 Personal history of nicotine dependence: Secondary | ICD-10-CM

## 2016-01-30 DIAGNOSIS — Z6841 Body Mass Index (BMI) 40.0 and over, adult: Secondary | ICD-10-CM

## 2016-01-30 DIAGNOSIS — Z825 Family history of asthma and other chronic lower respiratory diseases: Secondary | ICD-10-CM

## 2016-01-30 DIAGNOSIS — J4551 Severe persistent asthma with (acute) exacerbation: Principal | ICD-10-CM | POA: Diagnosis present

## 2016-01-30 DIAGNOSIS — F329 Major depressive disorder, single episode, unspecified: Secondary | ICD-10-CM | POA: Diagnosis present

## 2016-01-30 LAB — CBC WITH DIFFERENTIAL/PLATELET
BASOS ABS: 0 10*3/uL (ref 0.0–0.1)
Basophils Relative: 0 %
EOS PCT: 0 %
Eosinophils Absolute: 0 10*3/uL (ref 0.0–0.7)
HCT: 38 % (ref 36.0–46.0)
Hemoglobin: 11.7 g/dL — ABNORMAL LOW (ref 12.0–15.0)
LYMPHS PCT: 15 %
Lymphs Abs: 3.1 10*3/uL (ref 0.7–4.0)
MCH: 27 pg (ref 26.0–34.0)
MCHC: 30.8 g/dL (ref 30.0–36.0)
MCV: 87.8 fL (ref 78.0–100.0)
MONO ABS: 1.5 10*3/uL — AB (ref 0.1–1.0)
Monocytes Relative: 7 %
Neutro Abs: 16 10*3/uL — ABNORMAL HIGH (ref 1.7–7.7)
Neutrophils Relative %: 78 %
PLATELETS: 275 10*3/uL (ref 150–400)
RBC: 4.33 MIL/uL (ref 3.87–5.11)
RDW: 17.9 % — AB (ref 11.5–15.5)
WBC: 20.7 10*3/uL — ABNORMAL HIGH (ref 4.0–10.5)

## 2016-01-30 LAB — COMPREHENSIVE METABOLIC PANEL
ALK PHOS: 62 U/L (ref 38–126)
ALT: 47 U/L (ref 14–54)
AST: 36 U/L (ref 15–41)
Albumin: 3.3 g/dL — ABNORMAL LOW (ref 3.5–5.0)
Anion gap: 10 (ref 5–15)
BUN: 12 mg/dL (ref 6–20)
CALCIUM: 8.9 mg/dL (ref 8.9–10.3)
CO2: 29 mmol/L (ref 22–32)
CREATININE: 0.78 mg/dL (ref 0.44–1.00)
Chloride: 99 mmol/L — ABNORMAL LOW (ref 101–111)
GFR calc non Af Amer: >60 mL/min (ref >60)
GLUCOSE: 116 mg/dL — AB (ref 65–99)
Potassium: 3 mmol/L — ABNORMAL LOW (ref 3.5–5.1)
Sodium: 138 mmol/L (ref 135–145)
Total Bilirubin: 0.4 mg/dL (ref 0.3–1.2)
Total Protein: 6.1 g/dL — ABNORMAL LOW (ref 6.5–8.1)

## 2016-01-30 LAB — RAPID STREP SCREEN (MED CTR MEBANE ONLY): Streptococcus, Group A Screen (Direct): NEGATIVE

## 2016-01-30 LAB — BRAIN NATRIURETIC PEPTIDE: B NATRIURETIC PEPTIDE 5: 43 pg/mL (ref 0.0–100.0)

## 2016-01-30 MED ORDER — PANTOPRAZOLE SODIUM 40 MG PO TBEC
40.0000 mg | DELAYED_RELEASE_TABLET | Freq: Every day | ORAL | Status: DC
  Administered 2016-01-30 – 2016-01-31 (×2): 40 mg via ORAL
  Filled 2016-01-30 (×2): qty 1

## 2016-01-30 MED ORDER — MOMETASONE FURO-FORMOTEROL FUM 200-5 MCG/ACT IN AERO: 2.0000 | INHALATION_SPRAY | Freq: Two times a day (BID) | RESPIRATORY_TRACT | Status: DC

## 2016-01-30 MED ORDER — METHYLPREDNISOLONE SODIUM SUCC 125 MG IJ SOLR
125.0000 mg | Freq: Once | INTRAMUSCULAR | Status: AC
  Administered 2016-01-30: 125 mg via INTRAVENOUS
  Filled 2016-01-30: qty 2

## 2016-01-30 MED ORDER — MOMETASONE FURO-FORMOTEROL FUM 200-5 MCG/ACT IN AERO
2.0000 | INHALATION_SPRAY | Freq: Two times a day (BID) | RESPIRATORY_TRACT | Status: DC
  Administered 2016-01-30 – 2016-01-31 (×2): 2 via RESPIRATORY_TRACT
  Filled 2016-01-30: qty 8.8

## 2016-01-30 MED ORDER — ALBUTEROL SULFATE (2.5 MG/3ML) 0.083% IN NEBU
2.5000 mg | INHALATION_SOLUTION | RESPIRATORY_TRACT | Status: DC | PRN
  Administered 2016-01-31: 2.5 mg via RESPIRATORY_TRACT
  Filled 2016-01-30: qty 3

## 2016-01-30 MED ORDER — NICOTINE 14 MG/24HR TD PT24: 14.0000 mg | MEDICATED_PATCH | TRANSDERMAL | Status: DC

## 2016-01-30 MED ORDER — HYDROCHLOROTHIAZIDE 25 MG PO TABS
25.0000 mg | ORAL_TABLET | Freq: Every day | ORAL | Status: DC
  Administered 2016-01-30 – 2016-01-31 (×2): 25 mg via ORAL
  Filled 2016-01-30 (×2): qty 1

## 2016-01-30 MED ORDER — SERTRALINE HCL 50 MG PO TABS
50.0000 mg | ORAL_TABLET | Freq: Every day | ORAL | Status: DC
  Administered 2016-01-30 – 2016-01-31 (×2): 50 mg via ORAL
  Filled 2016-01-30 (×2): qty 1

## 2016-01-30 MED ORDER — IPRATROPIUM-ALBUTEROL 0.5-2.5 (3) MG/3ML IN SOLN
3.0000 mL | RESPIRATORY_TRACT | Status: DC | PRN
  Administered 2016-01-30 – 2016-01-31 (×2): 3 mL via RESPIRATORY_TRACT
  Filled 2016-01-30 (×2): qty 3

## 2016-01-30 MED ORDER — GUAIFENESIN ER 600 MG PO TB12
600.0000 mg | ORAL_TABLET | Freq: Two times a day (BID) | ORAL | Status: DC | PRN
  Administered 2016-01-31: 600 mg via ORAL
  Filled 2016-01-30: qty 1

## 2016-01-30 MED ORDER — NICOTINE 14 MG/24HR TD PT24
14.0000 mg | MEDICATED_PATCH | TRANSDERMAL | Status: DC
  Administered 2016-01-30: 14 mg via TRANSDERMAL
  Filled 2016-01-30: qty 1

## 2016-01-30 MED ORDER — MAGNESIUM SULFATE 2 GM/50ML IV SOLN
2.0000 g | Freq: Once | INTRAVENOUS | Status: AC
Start: 2016-01-30 — End: 2016-01-30
  Administered 2016-01-30: 2 g via INTRAVENOUS
  Filled 2016-01-30: qty 50

## 2016-01-30 MED ORDER — FLUTICASONE PROPIONATE 50 MCG/ACT NA SUSP
2.0000 | Freq: Every day | NASAL | Status: DC
  Administered 2016-01-31: 2 via NASAL
  Filled 2016-01-30: qty 16

## 2016-01-30 MED ORDER — LORATADINE 10 MG PO TABS
10.0000 mg | ORAL_TABLET | Freq: Every day | ORAL | Status: DC
  Administered 2016-01-30 – 2016-01-31 (×2): 10 mg via ORAL
  Filled 2016-01-30 (×2): qty 1

## 2016-01-30 MED ORDER — ACETAMINOPHEN 650 MG RE SUPP: 650.0000 mg | Freq: Four times a day (QID) | RECTAL | Status: DC | PRN

## 2016-01-30 MED ORDER — ALBUTEROL (5 MG/ML) CONTINUOUS INHALATION SOLN
10.0000 mg/h | INHALATION_SOLUTION | Freq: Once | RESPIRATORY_TRACT | Status: AC
  Administered 2016-01-30: 10 mg/h via RESPIRATORY_TRACT
  Filled 2016-01-30: qty 20

## 2016-01-30 MED ORDER — POTASSIUM CHLORIDE CRYS ER 20 MEQ PO TBCR
40.0000 meq | EXTENDED_RELEASE_TABLET | Freq: Once | ORAL | Status: AC
  Administered 2016-01-30: 40 meq via ORAL
  Filled 2016-01-30: qty 2

## 2016-01-30 MED ORDER — PREDNISONE 50 MG PO TABS
60.0000 mg | ORAL_TABLET | Freq: Every day | ORAL | Status: DC
  Administered 2016-01-31: 60 mg via ORAL
  Filled 2016-01-30: qty 1

## 2016-01-30 MED ORDER — IPRATROPIUM-ALBUTEROL 0.5-2.5 (3) MG/3ML IN SOLN
3.0000 mL | Freq: Once | RESPIRATORY_TRACT | Status: DC
  Filled 2016-01-30: qty 3

## 2016-01-30 MED ORDER — MELATONIN 3 MG PO TABS
3.0000 mg | ORAL_TABLET | Freq: Every day | ORAL | Status: DC
  Administered 2016-01-30: 3 mg via ORAL
  Filled 2016-01-30: qty 1

## 2016-01-30 MED ORDER — ACETAMINOPHEN 325 MG PO TABS
650.0000 mg | ORAL_TABLET | Freq: Four times a day (QID) | ORAL | Status: DC | PRN
  Administered 2016-01-31: 650 mg via ORAL
  Filled 2016-01-30: qty 2

## 2016-01-30 MED ORDER — ENOXAPARIN SODIUM 80 MG/0.8ML ~~LOC~~ SOLN
0.5000 mg/kg | SUBCUTANEOUS | Status: DC
  Administered 2016-01-30: 70 mg via SUBCUTANEOUS
  Filled 2016-01-30: qty 0.8

## 2016-01-30 MED ORDER — ACETAMINOPHEN 500 MG PO TABS
1000.0000 mg | ORAL_TABLET | Freq: Once | ORAL | Status: AC
Start: 2016-01-30 — End: 2016-01-30
  Administered 2016-01-30: 1000 mg via ORAL
  Filled 2016-01-30: qty 2

## 2016-01-30 MED ORDER — TIOTROPIUM BROMIDE MONOHYDRATE 18 MCG IN CAPS
18.0000 ug | ORAL_CAPSULE | Freq: Every day | RESPIRATORY_TRACT | Status: DC
  Administered 2016-01-31: 18 ug via RESPIRATORY_TRACT
  Filled 2016-01-30: qty 5

## 2016-01-30 MED ORDER — ENOXAPARIN SODIUM 40 MG/0.4ML ~~LOC~~ SOLN: 40.0000 mg | SUBCUTANEOUS | Status: DC

## 2016-01-30 MED ORDER — OMALIZUMAB 150 MG ~~LOC~~ SOLR
150.0000 mg | Freq: Once | SUBCUTANEOUS | Status: DC
  Administered 2016-01-30: 225 mg via SUBCUTANEOUS

## 2016-01-30 NOTE — Progress Notes (Signed)
Pt arrived floor around 8pm. Pt wanted order to bah. MD put in order to bath.Pt declined SCDs and fall precaution protocol.MD notified. Pt given Lovenox. Pt declined full RN skin assessment, just allowed extremities assessment. CN aware. Visible extremities intact. Pt wanted something for pain and a sleep aid. 1000mg  Tylenol and Melatonin ordered. PT asleep. Will continue to monitor.

## 2016-01-30 NOTE — ED Notes (Signed)
Pt given ginger ale to drink per [employer], RN

## 2016-01-30 NOTE — ED Triage Notes (Signed)
Pt states she has been using her inhaler for about a week with no relief. Pt states she has been SOB. Pt also states she fell recently and her right knee is hurting

## 2016-01-30 NOTE — ED Notes (Signed)
Pt has audible wheezing. Neb mask at pts side awaiting nurse for meds.   EDP aware.

## 2016-01-30 NOTE — Progress Notes (Signed)
ANTICOAGULATION CONSULT NOTE - Initial Consult  Pharmacy Consult for Lovenox Indication: VTE prophylaxis  No Known Allergies  Patient Measurements: Height: 5\' 6"  (167.6 cm) Weight: (!) 310 lb (140.6 kg) IBW/kg (Calculated) : 59.3   Vital Signs: Temp: 98.2 F (36.8 C) (10/03 2023) Temp Source: Oral (10/03 2023) BP: 153/77 (10/03 2023) Pulse Rate: 90 (10/03 2023)  Labs:  Recent Labs  01/30/16 1226  HGB 11.7*  HCT 38.0  PLT 275  CREATININE 0.78    Estimated Creatinine Clearance: 131.4 mL/min (by C-G formula based on SCr of 0.78 mg/dL).   Medical History: Past Medical History:  Diagnosis Date  . Acanthosis nigricans   . Arthritis   . Asthma    Followed by Dr. Annamaria Boots (pulmonology); receives every other week omalizumab injections; has frequent exacerbations  . COPD (chronic obstructive pulmonary disease) ([place])    PFTs in 2002, FEV1/FVC 65, no post bronchodilater test done  . Depression   . GERD (gastroesophageal reflux disease)   . Headache(784.0)   . Helicobacter pylori (H. pylori) infection   . Hypertension   . Hypertension, essential   . Insomnia   . Menorrhagia   . Morbid obesity ([place])   . Obesity   . Seasonal allergies   . Shortness of breath   . Sleep apnea    Sleep study 2008 - mild OSA, not enough events to titrate CPAP  . Tobacco user     Medications:  Facility-Administered Medications Prior to Admission  Medication Dose Route Frequency Provider Last Rate Last Dose  . [COMPLETED] omalizumab Arvid Right) injection 150 mg  150 mg Subcutaneous Once Deneise Lever, MD   225 mg at 01/30/16 1248   Prescriptions Prior to Admission  Medication Sig Dispense Refill Last Dose  . albuterol (PROVENTIL) (2.5 MG/3ML) 0.083% nebulizer solution Take 3 mLs (2.5 mg total) by nebulization every 4 (four) hours as needed for wheezing or shortness of breath. 150 mL 6 01/29/2016 at Unknown time  . albuterol (VENTOLIN HFA) 108 (90 Base) MCG/ACT inhaler INHALE 1-2 PUFFS INTO  THE LUNGS EVERY 4 HOURS as needed (Patient taking differently: Inhale 1-2 puffs into the lungs every 4 (four) hours as needed for wheezing or shortness of breath. ) 18 g 5 01/30/2016 at Unknown time  . budesonide-formoterol (SYMBICORT) 160-4.5 MCG/ACT inhaler Inhale 2 puffs into the lungs 2 (two) times daily. (Patient taking differently: Inhale 2 puffs into the lungs 2 (two) times daily as needed (for shortness of breath). ) 1 Inhaler 12 Past Week at Unknown time  . DULERA 200-5 MCG/ACT AERO Inhale 2 puffs into the lungs 2 (two) times daily.  5 01/30/2016 at Unknown time  . esomeprazole (NEXIUM) 40 MG capsule Take 40 mg by mouth daily.  3 01/29/2016 at Unknown time  . fluticasone (FLONASE) 50 MCG/ACT nasal spray Place 2 sprays into both nostrils daily. 16 g 6 01/29/2016 at Unknown time  . guaiFENesin (MUCINEX) 600 MG 12 hr tablet Take 1 tablet (600 mg total) by mouth 2 (two) times daily as needed. 60 tablet 0 01/29/2016 at Unknown time  . hydrochlorothiazide (HYDRODIURIL) 25 MG tablet Take 1 tablet (25 mg total) by mouth daily. 90 tablet 3 01/29/2016 at Unknown time  . ibuprofen (ADVIL,MOTRIN) 200 MG tablet Take 600 mg by mouth 2 (two) times daily as needed for moderate pain (knee pain).    01/30/2016 at Unknown time  . ipratropium (ATROVENT) 0.02 % nebulizer solution Take 2.5 mLs (0.5 mg total) by nebulization 4 (four) times daily as needed  for wheezing or shortness of breath. 75 mL 2 01/29/2016 at Unknown time  . loratadine (CLARITIN) 10 MG tablet Take 1 tablet (10 mg total) by mouth daily. 90 tablet 3 01/29/2016 at Unknown time  . nicotine (NICODERM CQ - DOSED IN MG/24 HOURS) 14 mg/24hr patch Place 14 mg onto the skin 3 (three) times a week.   Past Week at Unknown time  . omalizumab (XOLAIR) 150 MG injection Inject 150 mg into the skin every 14 (fourteen) days.   01/30/2016 at Unknown time  . predniSONE (DELTASONE) 20 MG tablet Take 4 for four days 3 for four days 2 for four days 1 for four days (Patient taking  differently: Take 40 mg by mouth daily. ) 40 tablet 3 01/29/2016 at Unknown time  . Pseudoeph-Doxylamine-DM-APAP (NYQUIL PO) Take 15 mLs by mouth as needed (for cough).   01/29/2016 at Unknown time  . sertraline (ZOLOFT) 50 MG tablet Take 1 tablet (50 mg total) by mouth daily. 90 tablet 3 01/29/2016 at Unknown time  . Tiotropium Bromide Monohydrate (SPIRIVA RESPIMAT) 2.5 MCG/ACT AERS Inhale 2 puffs into the lungs daily. (Patient taking differently: Inhale 2 puffs into the lungs daily as needed (for shortness of breath). ) 1 Inhaler 6 Past Month at Unknown time  . acetaminophen (TYLENOL) 500 MG tablet Take 2 tablets (1,000 mg total) by mouth every 6 (six) hours as needed. (Patient taking differently: Take 1,000 mg by mouth every 6 (six) hours as needed for mild pain. ) 90 tablet 3 Unknown at Unknown   Scheduled:  . enoxaparin (LOVENOX) injection  40 mg Subcutaneous Q24H  . fluticasone  2 spray Each Nare Daily  . hydrochlorothiazide  25 mg Oral Daily  . loratadine  10 mg Oral Daily  . mometasone-formoterol  2 puff Inhalation BID  . nicotine  14 mg Transdermal Q24H  . pantoprazole  40 mg Oral Daily  . potassium chloride  40 mEq Oral Once  . [START ON 01/31/2016] predniSONE  60 mg Oral Q breakfast  . sertraline  50 mg Oral Daily  . tiotropium  18 mcg Inhalation Daily    Assessment: 45 y.o female admitted 01/30/16 with PMH of asthma and hypertension who presents with shortness of breath. Admitted for Asthma exacerbation. The patient is morbidly obese.  Weight = 140.6 kg, Ht 66 inches, BMI 50.  PLTC wnl, Hgb 11.7,  SCr 0.78, normal renal function. For BMI >30, we will give lovenox 0.5 mg/kg Sq Q24h = 70 mg SQ q24h   Goal of Therapy:  Anti-Xa level 0.3-0.6 units/ml 4 hrs after LMWH dose given (VTE prophylaxis) Monitor platelets by anticoagulation protocol: Yes   Plan:  Lovenox  0.5 mg/kg Sq Q24h = 70 mg SQ q24h Monitor for s/sx of bleeding, CBC. Scr weekly  Thank you for allowing pharmacy to  be part of this patients care team.  Nicole Cella, [place] Clinical Pharmacist Pager: [phone] 01/30/2016,8:34 PM

## 2016-01-30 NOTE — ED Provider Notes (Signed)
[facility] DEPT Provider Note   CSN: [id number] Arrival date & time: 01/30/16  1117     History   Chief Complaint Chief Complaint  Patient presents with  . Asthma  . Knee Pain    HPI April [name] is a 45 y.o. female.  The history is provided by the patient.  Shortness of Breath  This is a recurrent problem. The problem occurs continuously.The current episode started more than 1 week ago. The problem has been gradually worsening. Associated symptoms include sore throat, cough, wheezing and leg swelling. Pertinent negatives include no fever, no headaches, no rhinorrhea, no neck pain, no sputum production, no chest pain, no syncope, no vomiting and no abdominal pain. It is unknown what precipitated the problem. She has tried inhaled steroids, oral steroids and beta-agonist inhalers for the symptoms. She has had prior hospitalizations. She has had prior ED visits. Associated medical issues include asthma and chronic lung disease.   Patient is followed by [facility] pulmonology. Patient reports that 2 days ago she was placed on an oral steroid taper. No improvement since that time. She endorses good compliance with her medication regimen, but did not take her blood pressure medication has morning due to coming directly to the ED from work.   Past Medical History:  Diagnosis Date  . Acanthosis nigricans   . Arthritis   . Asthma    Followed by Dr. Annamaria Boots (pulmonology); receives every other week omalizumab injections; has frequent exacerbations  . COPD (chronic obstructive pulmonary disease) ([place])    PFTs in 2002, FEV1/FVC 65, no post bronchodilater test done  . Depression   . GERD (gastroesophageal reflux disease)   . Headache(784.0)   . Helicobacter pylori (H. pylori) infection   . Hypertension   . Hypertension, essential   . Insomnia   . Menorrhagia   . Morbid obesity ([place])   . Obesity   . Seasonal allergies   . Shortness of breath   . Sleep apnea    Sleep study 2008 - mild  OSA, not enough events to titrate CPAP  . Tobacco user     Patient Active Problem List   Diagnosis Date Noted  . Asthma exacerbation 11/10/2015  . GERD (gastroesophageal reflux disease) 08/30/2015  . Generalized anxiety disorder 08/30/2015  . Seasonal allergic rhinitis 08/29/2013  . Asthma, chronic obstructive, without status asthmaticus ([place]) 05/07/2012  . Knee pain, bilateral 04/25/2011  . Obstructive sleep apnea 12/19/2010  . Hypokalemia 08/13/2010  . Cervical back pain with evidence of disc disease 04/08/2008  . Essential hypertension 07/31/2006  . Morbid obesity with body mass index of 50.0-59.9 in adult ([place]) 06/17/2006  . Major depressive disorder, recurrent episode ([place]) 04/10/2006    Past Surgical History:  Procedure Laterality Date  . BREAST REDUCTION SURGERY  09/2011  . TUBAL LIGATION  1996   bilateral    OB History    No data available       Home Medications    Prior to Admission medications   Medication Sig Start Date End Date Taking? Authorizing Provider  albuterol (PROVENTIL) (2.5 MG/3ML) 0.083% nebulizer solution Take 3 mLs (2.5 mg total) by nebulization every 4 (four) hours as needed for wheezing or shortness of breath. 12/27/15  Yes Elsie Stain, MD  albuterol (VENTOLIN HFA) 108 (90 Base) MCG/ACT inhaler INHALE 1-2 PUFFS INTO THE LUNGS EVERY 4 HOURS as needed Patient taking differently: Inhale 1-2 puffs into the lungs every 4 (four) hours as needed for wheezing or shortness of breath.  12/27/15  Yes Elsie Stain, MD  budesonide-formoterol [facility]) 160-4.5 MCG/ACT inhaler Inhale 2 puffs into the lungs 2 (two) times daily. Patient taking differently: Inhale 2 puffs into the lungs 2 (two) times daily as needed (for shortness of breath).  12/27/15 12/26/16 Yes Elsie Stain, MD  DULERA 200-5 MCG/ACT AERO Inhale 2 puffs into the lungs 2 (two) times daily. 10/26/15  Yes Historical Provider, MD  esomeprazole (NEXIUM) 40 MG capsule Take 40 mg by mouth  daily. 08/30/15  Yes Historical Provider, MD  fluticasone (FLONASE) 50 MCG/ACT nasal spray Place 2 sprays into both nostrils daily. 12/27/15  Yes Elsie Stain, MD  guaiFENesin (MUCINEX) 600 MG 12 hr tablet Take 1 tablet (600 mg total) by mouth 2 (two) times daily as needed. 12/21/15  Yes [name], MD  hydrochlorothiazide (HYDRODIURIL) 25 MG tablet Take 1 tablet (25 mg total) by mouth daily. 12/27/15  Yes Elsie Stain, MD  ibuprofen (ADVIL,MOTRIN) 200 MG tablet Take 600 mg by mouth 2 (two) times daily as needed for moderate pain (knee pain).    Yes Historical Provider, MD  ipratropium (ATROVENT) 0.02 % nebulizer solution Take 2.5 mLs (0.5 mg total) by nebulization 4 (four) times daily as needed for wheezing or shortness of breath. 12/27/15  Yes Elsie Stain, MD  loratadine (CLARITIN) 10 MG tablet Take 1 tablet (10 mg total) by mouth daily. 08/30/15  Yes [name], MD  nicotine (NICODERM CQ - DOSED IN MG/24 HOURS) 14 mg/24hr patch Place 14 mg onto the skin 3 (three) times a week.   Yes Historical Provider, MD  omalizumab Arvid Right) 150 MG injection Inject 150 mg into the skin every 14 (fourteen) days.   Yes Historical Provider, MD  predniSONE (DELTASONE) 20 MG tablet Take 4 for four days 3 for four days 2 for four days 1 for four days Patient taking differently: Take 40 mg by mouth daily.  01/15/16  Yes Deneise Lever, MD  Pseudoeph-Doxylamine-DM-APAP (NYQUIL PO) Take 15 mLs by mouth as needed (for cough).   Yes Historical Provider, MD  sertraline (ZOLOFT) 50 MG tablet Take 1 tablet (50 mg total) by mouth daily. 08/30/15  Yes [name], MD  Tiotropium Bromide Monohydrate (SPIRIVA RESPIMAT) 2.5 MCG/ACT AERS Inhale 2 puffs into the lungs daily. Patient taking differently: Inhale 2 puffs into the lungs daily as needed (for shortness of breath).  12/27/15  Yes Elsie Stain, MD  acetaminophen (TYLENOL) 500 MG tablet Take 2 tablets (1,000 mg total) by mouth every 6 (six) hours as needed. Patient  taking differently: Take 1,000 mg by mouth every 6 (six) hours as needed for mild pain.  08/30/15   [name], MD    Family History Family History  Problem Relation Age of Onset  . Hypertension Mother   . Asthma Daughter   . Cancer Paternal Aunt   . Asthma Maternal Grandmother     Social History Social History  Substance Use Topics  . Smoking status: Former Smoker    Years: 18.00    Types: Cigarettes    Quit date: 09/15/2014  . Smokeless tobacco: Former Systems developer    Quit date: 09/12/2014  . Alcohol use No     Allergies   Review of patient's allergies indicates no known allergies.   Review of Systems Review of Systems  Constitutional: Negative for activity change, chills, diaphoresis and fever.  HENT: Positive for sore throat. Negative for postnasal drip and rhinorrhea.   Respiratory: Positive for cough, shortness of  breath and wheezing. Negative for sputum production and chest tightness.   Cardiovascular: Positive for leg swelling. Negative for chest pain and syncope.  Gastrointestinal: Negative for abdominal pain, diarrhea, nausea and vomiting.  Endocrine: Negative for polyuria.  Genitourinary: Negative for dysuria.  Musculoskeletal: Negative for neck pain.  Neurological: Negative for dizziness, syncope, light-headedness and headaches.     Physical Exam Updated Vital Signs BP (!) 191/120 (BP Location: Left Arm)   Pulse 83   Temp 98 F (36.7 C) (Oral)   Resp 20   Ht 5\' 6"  (1.676 m)   Wt (!) 140.6 kg   LMP 01/23/2016   SpO2 97%   BMI 50.04 kg/m   Physical Exam  Constitutional: She is oriented to person, place, and time. She appears well-developed and well-nourished. No distress.  HENT:  Head: Normocephalic and atraumatic.  Right Ear: External ear normal.  Left Ear: External ear normal.  Nose: Nose normal.  Eyes: Conjunctivae and EOM are normal. Pupils are equal, round, and reactive to light.  Neck: Normal range of motion. No JVD present.  Cardiovascular:  Normal rate, regular rhythm, normal heart sounds and intact distal pulses.   No murmur heard. Pulmonary/Chest: No stridor. She exhibits no tenderness.  Prolonged expiratory phase. Lung sounds diminished on left side compared to right. Wheezes in all lung fields.  Abdominal: Soft. Bowel sounds are normal. She exhibits no distension. There is no tenderness. There is no rebound and no guarding.  Musculoskeletal: Normal range of motion. She exhibits no tenderness or deformity.  +1 edema in ankles bilaterally.  Lymphadenopathy:    She has no cervical adenopathy.  Neurological: She is alert and oriented to person, place, and time. No cranial nerve deficit. She exhibits normal muscle tone.  Skin: Skin is warm and dry. Capillary refill takes less than 2 seconds. No rash noted. She is not diaphoretic. No erythema. No pallor.  Psychiatric: She has a normal mood and affect. Her behavior is normal. Thought content normal.     ED Treatments / Results  Labs (all labs ordered are listed, but only abnormal results are displayed) Labs Reviewed  COMPREHENSIVE METABOLIC PANEL - Abnormal; Notable for the following:       Result Value   Potassium 3.0 (*)    Chloride 99 (*)    Glucose, Bld 116 (*)    Total Protein 6.1 (*)    Albumin 3.3 (*)    All other components within normal limits  CBC WITH DIFFERENTIAL/PLATELET - Abnormal; Notable for the following:    WBC 20.7 (*)    Hemoglobin 11.7 (*)    RDW 17.9 (*)    Neutro Abs 16.0 (*)    Monocytes Absolute 1.5 (*)    All other components within normal limits  RAPID STREP SCREEN (NOT AT [facility])  BRAIN NATRIURETIC PEPTIDE    EKG  EKG Interpretation None       Radiology Dg Chest 2 View  Result Date: 01/30/2016 CLINICAL DATA:  Shortness of breath an wheezing over the last week. EXAM: CHEST  2 VIEW COMPARISON:  12/18/2015 FINDINGS: Heart size is normal. Mediastinal shadows are normal. There may be central bronchial thickening but there is no  infiltrate, collapse or effusion. No bony abnormality. IMPRESSION: Bronchitis.  No consolidation or collapse. Electronically Signed   By: Nelson Chimes M.D.   On: 01/30/2016 14:25    Procedures Procedures (including critical care time)  Medications Ordered in ED Medications  methylPREDNISolone sodium succinate (SOLU-MEDROL) 125 mg/2 mL injection 125 mg (  125 mg Intravenous Given 01/30/16 1231)  magnesium sulfate IVPB 2 g 50 mL (0 g Intravenous Stopped 01/30/16 1336)  albuterol (PROVENTIL,VENTOLIN) solution continuous neb (10 mg/hr Nebulization Given by Other 01/30/16 1236)     Initial Impression / Assessment and Plan / ED Course  I have reviewed the triage vital signs and the nursing notes.  Pertinent labs & imaging results that were available during my care of the patient were reviewed by me and considered in my medical decision making (see chart for details).  Clinical Course   Patient signs and symptoms most consistent with severe but subacute asthma exacerbation. Patient received 1 course of Solu-Medrol 125 mg IV. As well as 2 g of IV magnesium. Patient received a round of continuous albuterol treatment and upon reexamination was still audibly wheezing with some tachypnea. Patient's prolonged expiratory phase persisted as well. Patient continued to have oxygen saturations in the mid to high 90s on room air. Although this was the case patient's status was deemed unstable for discharge home as her respiratory status could easily transition toward unstable and patient likely has a very difficult airway (2/2 morbid obesity) -- so admission to the floor was considered the most appropriate at this time.   Of Note: Patient remains severely hypertensive at this time. She continues to be asymptomatic despite SBPs in the 190s.  Final Clinical Impressions(s) / ED Diagnoses   Final diagnoses:  Severe persistent asthma with exacerbation    New Prescriptions New Prescriptions   No medications on  file     Elberta Leatherwood, MD 01/30/16 1622    Blanchie Dessert, MD 01/30/16 2130

## 2016-01-30 NOTE — ED Notes (Signed)
Attempted report x1. 

## 2016-01-30 NOTE — H&P (Signed)
Date: 01/30/2016               Patient Name:  April [name] MRN: [id number]  DOB: [birth year] Age / Sex: 45 y.o., female   PCP: No Pcp Per Patient         Medical Service: Internal Medicine Teaching Service         Attending Physician: Dr. Michel Bickers, MD    First Contact: Dr. Ledell Noss  Pager: [phone]  Second Contact: Dr. Julious Oka  Pager: [phone]       After Hours (After 5p/  First Contact Pager: [phone]  weekends / holidays): Second Contact Pager: [phone]   Chief Complaint: shortness of breath   History of Present Illness: Ms. April [name] is a 45 y.o. female with a PMH of asthma and hypertension who presents with shortness of breath. He was diagnosed with asthma at age 7 and has frequent asthma exacerbations never requiring intubation. She is managed with albuterol inhaler and nebs, Spiriva, prednisone, xolair injections, ipratropium nebs, dulera inhaler and symbicort inhalers at home. On her best weeks where symptoms are controlled she uses albuterol inhaler 3-4 times per day. One week ago she began having shortness of breath on exertion, nonproductive cough, and wheezing. She has been taking care of her grandchildren who are sick with colds and started experiencing soar throat and sinus congestion earlier this week. 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 She tried using her albuterol nebulizers and inhalers but continued to have exacerbations. She says that she does have access to medications but finds albuterol inhalers expensive and she runs out of them frequently, she has not been using spiriva as she ran out of this. She did get her flu vaccine this year. She has also had chronic leg swelling which responds to her home dose of HCTZ.  She denies fever, runny nose or ear pain, headache, orthopnea, changes in vision, chest pain, or diarrhea.   In the ED she was found to have wbc count 20.7 with 16,000 neutrophils and chest xray showed  bronchitis with no pulmonary collapse or consolidation. She was given albuterol nebs and solumedrol 125 mg IV and experienced significant improvement in her symptoms.  Meds:   (Not in a hospital admission)  Allergies: Allergies as of 01/30/2016  . (No Known Allergies)   Past Medical History:  Diagnosis Date  . Acanthosis nigricans   . Arthritis   . Asthma    Followed by Dr. Annamaria Boots (pulmonology); receives every other week omalizumab injections; has frequent exacerbations  . COPD (chronic obstructive pulmonary disease) ([place])    PFTs in 2002, FEV1/FVC 65, no post bronchodilater test done  . Depression   . GERD (gastroesophageal reflux disease)   . Headache(784.0)   . Helicobacter pylori (H. pylori) infection   . Hypertension   . Hypertension, essential   . Insomnia   . Menorrhagia   . Morbid obesity ([place])   . Obesity   . Seasonal allergies   . Shortness of breath   . Sleep apnea    Sleep study 2008 - mild OSA, not enough events to titrate CPAP  . Tobacco user     Family History:  Family History  Problem Relation Age of Onset  . Hypertension Mother   . Asthma Daughter   . Cancer Paternal Aunt   . Asthma Maternal Grandmother    Social History:  She works at assisted living as a Quarry manager.  She uses nicotine patches 3 times weekly at home. Denies tobacco, alcohol, or illicit drug use.   Review of Systems: A complete ROS was negative except as per HPI.   Physical Exam: Vitals:   01/30/16 1315 01/30/16 1345 01/30/16 1538 01/30/16 1715  BP: 160/71 (!) 179/104 (!) 191/120 164/96  Pulse: 95 102 83 94  Resp: 19 19 20 18   Temp:      TempSrc:      SpO2: 95% 95% 97% 97%  Weight:      Height:       Physical Exam  Constitutional: She appears well-developed and well-nourished. No distress.  Obese female   HENT:  Head: Normocephalic and atraumatic.  Eyes: EOM are normal. Pupils are equal, round, and reactive to light.  Neck: Normal range of motion. Neck supple. No thyromegaly  present.  Neck tender to palpation, right more than left   Cardiovascular: Normal rate and regular rhythm.   No murmur heard. Pulmonary/Chest: No respiratory distress. She has wheezes. She has no rales.  Diffuse bilateral expiratory wheeze  Abdominal: Soft. She exhibits no distension. There is no tenderness.  Musculoskeletal: She exhibits edema.  2+ bilateral lower extremity edema   Lymphadenopathy:    She has no cervical adenopathy.  Neurological: She is alert. No cranial nerve deficit.  Psychiatric: She has a normal mood and affect. Her behavior is normal.    Labs: CBC:  Recent Labs Lab 01/30/16 1226  WBC 20.7*  NEUTROABS 16.0*  HGB 11.7*  HCT 38.0  MCV 87.8  PLT 123XX123   Basic Metabolic Panel:  Recent Labs Lab 01/30/16 1226  NA 138  K 3.0*  CL 99*  CO2 29  GLUCOSE 116*  BUN 12  CREATININE 0.78  CALCIUM 8.9   BNP (last 3 results)  Recent Labs  08/22/15 1809 12/18/15 0550 01/30/16 1226  BNP 70.8 46.1 43.0   Liver Function Tests:  Recent Labs Lab 01/30/16 1226  AST 36  ALT 47  ALKPHOS 62  BILITOT 0.4  PROT 6.1*  ALBUMIN 3.3*   Imaging: Dg Chest 2 View  Result Date: 01/30/2016 CLINICAL DATA:  Shortness of breath an wheezing over the last week. EXAM: CHEST  2 VIEW COMPARISON:  12/18/2015 FINDINGS: Heart size is normal. Mediastinal shadows are normal. There may be central bronchial thickening but there is no infiltrate, collapse or effusion. No bony abnormality. IMPRESSION: Bronchitis.  No consolidation or collapse. Electronically Signed   By: Nelson Chimes M.D.   On: 01/30/2016 14:25   Assessment & Plan by Problem: Ms. April [name] is a 45 y.o. female with PMH asthma and hypertension. Presented today with difficulty breathing, wheezing, and non productive cough. In the ED chest xray showed signs of bronchitis and she was given albuterol nebs and solumedrol 125 mg which provided some relief of her symptoms. This clinical presentation is most likely related  to asthma exacerbation related to recent sick contacts and cold symptoms as a result.   Principal Problem:   Asthma exacerbation Active Problems:   Morbid obesity with body mass index of 50.0-59.9 in adult [facility])   Essential hypertension   Hypokalemia  1. Acute on chronic asthma exacerbation  This was most likely triggered by recent sick contact exposure and URI symptoms. Dyspnea has improved somewhat with albuterol and solumedrol in the ED. She has expiratory wheeze on exam and chest xray is suspicious for bronchitis. Elevated leukocyte count with neutrophilia  is likely related to her chronic steroid necessity.  - albuterol  nebs q2h prn and duonebs q 4h prn -continue home spiriva, dulera  - continue prednisone 60 -supplemental oxygen as needed   Chronic allergies  - ordered flonase   Hypokalemia  This is chronic, she has been treated with potassium outpatient in the past but this was discontinued. She does report some muscle pain and weakness recently. She was given k-dur 40 mg in the ED and another 40 mg on admission.  - follow up BMET   Hypertension and leg swelling  She is on HCTZ at home, this will be continued.   Tobacco abuse  - nicoderm subq   Depression  Ordered home sertraline 50 mg   GERD  Ordered home protonix 40 mg   F none E hypokalemia  N regular diet  DVT Ppx lovenox  Code Status full   Dispo: Admit patient to Inpatient with expected length of stay greater than 2 midnights.  Signed: Ledell Noss, MD 01/30/2016, 7:13 PM  Pager: [phone]

## 2016-01-31 ENCOUNTER — Telehealth: Payer: Self-pay | Admitting: Internal Medicine

## 2016-01-31 LAB — BASIC METABOLIC PANEL
ANION GAP: 10 (ref 5–15)
BUN: 8 mg/dL (ref 6–20)
CO2: 30 mmol/L (ref 22–32)
Calcium: 8.8 mg/dL — ABNORMAL LOW (ref 8.9–10.3)
Chloride: 98 mmol/L — ABNORMAL LOW (ref 101–111)
Creatinine, Ser: 0.72 mg/dL (ref 0.44–1.00)
GFR calc Af Amer: >60 mL/min (ref >60)
Glucose, Bld: 175 mg/dL — ABNORMAL HIGH (ref 65–99)
POTASSIUM: 3.5 mmol/L (ref 3.5–5.1)
SODIUM: 138 mmol/L (ref 135–145)

## 2016-01-31 LAB — CBC
HEMATOCRIT: 37.9 % (ref 36.0–46.0)
HEMOGLOBIN: 11.5 g/dL — AB (ref 12.0–15.0)
MCH: 26.9 pg (ref 26.0–34.0)
MCHC: 30.3 g/dL (ref 30.0–36.0)
MCV: 88.6 fL (ref 78.0–100.0)
Platelets: 276 10*3/uL (ref 150–400)
RBC: 4.28 MIL/uL (ref 3.87–5.11)
RDW: 17.8 % — ABNORMAL HIGH (ref 11.5–15.5)
WBC: 18.3 10*3/uL — AB (ref 4.0–10.5)

## 2016-01-31 MED ORDER — ALBUTEROL SULFATE HFA 108 (90 BASE) MCG/ACT IN AERS: INHALATION_SPRAY | RESPIRATORY_TRACT | 5 refills | Status: DC

## 2016-01-31 MED ORDER — IPRATROPIUM-ALBUTEROL 0.5-2.5 (3) MG/3ML IN SOLN
3.0000 mL | Freq: Four times a day (QID) | RESPIRATORY_TRACT | Status: DC
  Administered 2016-01-31 (×2): 3 mL via RESPIRATORY_TRACT
  Filled 2016-01-31 (×2): qty 3

## 2016-01-31 MED ORDER — ALBUTEROL SULFATE (2.5 MG/3ML) 0.083% IN NEBU: 2.5000 mg | INHALATION_SOLUTION | Freq: Three times a day (TID) | RESPIRATORY_TRACT | Status: DC

## 2016-01-31 MED FILL — VENTOLIN HFA 90 MCG INHALER: 108 (90 BAS | 30 days supply | Qty: 18 | Fill #1

## 2016-01-31 NOTE — Progress Notes (Signed)
Pt given discharge instructions, prescriptions, and care notes. Pt verbalized understanding AEB no further questions or concerns at this time. IV was discontinued, no redness, pain, or swelling noted at this time. Telemetry discontinued and Centralized Telemetry was notified. Pt left the floor via wheelchair with staff in stable condition. 

## 2016-01-31 NOTE — Telephone Encounter (Signed)
Created in error

## 2016-01-31 NOTE — Telephone Encounter (Signed)
Attempted to contact pt. No answer, no option to leave a message. Will try back.  

## 2016-01-31 NOTE — Discharge Instructions (Signed)
Thank you for trusting [place] with your medical care!  You were hospitalized for asthma exacerbation and treated with steroids and nebulizer treatments.   Please take note of the following changes to your medications: Complete the steroids that you were prescribed by the pulmonologist, this is very important  Use the spacer when taking your albuterol inhaler at home  Use the albuterol nebulizer when you're at home instead of your albuterol inhaler   To make sure you are getting better, please make it to the follow-up appointments listed on the first page.  If you have any questions, please call [phone].

## 2016-01-31 NOTE — Progress Notes (Signed)
Subjective: Ms. GABRYELA TRAISTER says her difficulty with breathing has improved significantly overnight. She has a peak flow meter at home and knows that her normal is 70 however she doesn't use the meter. She says that she does not always use a spacer with her albuterol inhaler because it is inconvenient to find the extra part. He says she has been receiving Xolair injections for the last 2 years she doesn't feel that her symptoms improved immediately after injections but she does notice that her symptoms worsen when she is due for her next injection. Had received her prior injection 2 weeks prior to presentation and was due for a new one which she received yesterday. She states that she feels ready to go home today.   Objective:  Vital signs in last 24 hours: Vitals:   01/30/16 2023 01/30/16 2145 01/31/16 0012 01/31/16 0243  BP: (!) 153/77     Pulse: 90 84 86 88  Resp: 20 18 18 18   Temp: 98.2 F (36.8 C)     TempSrc: Oral     SpO2: 94%     Weight:      Height:       Physical Exam  Constitutional: She appears well-developed and well-nourished.  Cardiovascular: Normal rate and regular rhythm.   No murmur heard. Pulmonary/Chest: Effort normal. No respiratory distress. She has wheezes. She has no rales.  Slight expiratory phase wheeze   Abdominal: Bowel sounds are normal. She exhibits no distension. There is no tenderness.  Extremities: no calf tenderness, no peripheral edema   Labs: CBC:  Recent Labs Lab 01/30/16 1226 01/31/16 0453  WBC 20.7* 18.3*  NEUTROABS 16.0*  --   HGB 11.7* 11.5*  HCT 38.0 37.9  MCV 87.8 88.6  PLT [id number]   Metabolic Panel:  Recent Labs Lab 01/30/16 1226 01/31/16 0453  NA 138 138  K 3.0* 3.5  CL 99* 98*  CO2 29 30  GLUCOSE 116* 175*  BUN 12 8  CREATININE 0.78 0.72  CALCIUM 8.9 8.8*  ALT 47  --   ALKPHOS 62  --   BILITOT 0.4  --   PROT 6.1*  --   ALBUMIN 3.3*  --     Medications: Infusions:   Scheduled Medications: . enoxaparin  (LOVENOX) injection  0.5 mg/kg Subcutaneous Q24H  . fluticasone  2 spray Each Nare Daily  . hydrochlorothiazide  25 mg Oral Daily  . ipratropium-albuterol  3 mL Nebulization QID  . loratadine  10 mg Oral Daily  . Melatonin  3 mg Oral QHS  . mometasone-formoterol  2 puff Inhalation BID  . nicotine  14 mg Transdermal Q24H  . pantoprazole  40 mg Oral Daily  . predniSONE  60 mg Oral Q breakfast  . sertraline  50 mg Oral Daily  . tiotropium  18 mcg Inhalation Daily   PRN Medications: acetaminophen **OR** acetaminophen, albuterol, guaiFENesin  Assessment/Plan: Principal Problem:   Asthma exacerbation Active Problems:   Morbid obesity with body mass index of 50.0-59.9 in adult [facility])   Major depressive disorder, recurrent episode ([place])   Essential hypertension   Hypokalemia   Obstructive sleep apnea   GERD (gastroesophageal reflux disease)  Acute asthma exacerbation Pt is a 45 y.o. yo female with a PMHx of asthma and hypertension  who was admitted on 01/30/2016 with symptoms of shortness of breath, soar throat, and sinus congestion. Shortness of breath improved significantly with Solu-Medrol 125 mg in continuation of her complex home medication regiment. Strep cultures were  taken in the emergency department and were negative. These symptoms were determined to be secondary to acute asthma exacerbation.  She will be discharged with plans to continue her steroid taper and will be scheduled for close pulmonology follow up   Dispo: Anticipated discharge today   LOS: 1 day   Ledell Noss, MD 01/31/2016, 11:34 AM Pager: [phone]

## 2016-01-31 NOTE — Care Management Note (Signed)
Case Management Note  Patient Details  Name: April [name] MRN: [id number] Date of Birth: 05/03/1970  Subjective/Objective:                 Patient with history of multiple admissions admitted for Asthma exacerbation. PCP not listed but patient sees Dr Annamaria Boots, pulmonologist, as primary provider. Patient has access to medications, questionable compliance. Bedside RN stated patient to be sent home with inhalers used while inpatient. Patient admitted <24 hours   Action/Plan:   Expected Discharge Date:                  Expected Discharge Plan:  Home/Self Care  In-House Referral:  NA  Discharge planning Services  CM Consult  Post Acute Care Choice:  NA Choice offered to:  NA  DME Arranged:  N/A DME Agency:  NA  HH Arranged:  NA HH Agency:  NA  Status of Service:  Completed, signed off  If discussed at [place] of Stay Meetings, dates discussed:    Additional Comments:  Carles Collet, RN 01/31/2016, 12:22 PM

## 2016-01-31 NOTE — Telephone Encounter (Signed)
#   vials:4 Ordered date:01/31/16 Shipping Date:02/01/16

## 2016-01-31 NOTE — Discharge Summary (Signed)
Name: April [name] MRN: [id number] DOB: June 14, 1970 45 y.o. PCP: No Pcp Per Patient  Date of Admission: 01/30/2016 11:29 AM Date of Discharge: 01/31/2016 Attending Physician: Dr. Michel Bickers  Discharge Diagnosis:   Acute Asthma exacerbation  Likely related to URI   Responded to solumedrol 125 mg in the ED, discharged with instruction to continue steroid taper  Discharge Medications:   Medication List    TAKE these medications   acetaminophen 500 MG tablet Commonly known as:  TYLENOL Take 2 tablets (1,000 mg total) by mouth every 6 (six) hours as needed. What changed:  reasons to take this   albuterol (2.5 MG/3ML) 0.083% nebulizer solution Commonly known as:  PROVENTIL Take 3 mLs (2.5 mg total) by nebulization every 4 (four) hours as needed for wheezing or shortness of breath. What changed:  Another medication with the same name was changed. Make sure you understand how and when to take each.   albuterol 108 (90 Base) MCG/ACT inhaler Commonly known as:  VENTOLIN HFA INHALE 1-2 PUFFS INTO THE LUNGS EVERY 4 HOURS as needed What changed:  how much to take  how to take this  when to take this  reasons to take this  additional instructions   budesonide-formoterol 160-4.5 MCG/ACT inhaler Commonly known as:  SYMBICORT Inhale 2 puffs into the lungs 2 (two) times daily. What changed:  when to take this  reasons to take this   DULERA 200-5 MCG/ACT Aero Generic drug:  mometasone-formoterol Inhale 2 puffs into the lungs 2 (two) times daily.   esomeprazole 40 MG capsule Commonly known as:  NEXIUM Take 40 mg by mouth daily.   fluticasone 50 MCG/ACT nasal spray Commonly known as:  FLONASE Place 2 sprays into both nostrils daily.   guaiFENesin 600 MG 12 hr tablet Commonly known as:  MUCINEX Take 1 tablet (600 mg total) by mouth 2 (two) times daily as needed.   hydrochlorothiazide 25 MG tablet Commonly known as:  HYDRODIURIL Take 1 tablet (25 mg total) by mouth  daily.   ibuprofen 200 MG tablet Commonly known as:  ADVIL,MOTRIN Take 600 mg by mouth 2 (two) times daily as needed for moderate pain (knee pain).   ipratropium 0.02 % nebulizer solution Commonly known as:  ATROVENT Take 2.5 mLs (0.5 mg total) by nebulization 4 (four) times daily as needed for wheezing or shortness of breath.   loratadine 10 MG tablet Commonly known as:  CLARITIN Take 1 tablet (10 mg total) by mouth daily.   nicotine 14 mg/24hr patch Commonly known as:  NICODERM CQ - dosed in mg/24 hours Place 14 mg onto the skin 3 (three) times a week.   NYQUIL PO Take 15 mLs by mouth as needed (for cough).   omalizumab 150 MG injection Commonly known as:  XOLAIR Inject 150 mg into the skin every 14 (fourteen) days.   predniSONE 20 MG tablet Commonly known as:  DELTASONE Take 4 for four days 3 for four days 2 for four days 1 for four days What changed:  how much to take  how to take this  when to take this  additional instructions   sertraline 50 MG tablet Commonly known as:  ZOLOFT Take 1 tablet (50 mg total) by mouth daily.   Tiotropium Bromide Monohydrate 2.5 MCG/ACT Aers Commonly known as:  SPIRIVA RESPIMAT Inhale 2 puffs into the lungs daily. What changed:  when to take this  reasons to take this      Disposition and follow-up:  April [name] was discharged from [facility] in Good condition.  At the hospital follow up visit please address:  1.  Has she completed prednisone taper and does she have a flow meter at home?   2.  Labs / imaging needed at time of follow-up: none   3.  Pending labs/ test needing follow-up: none   Follow-up Appointments: Follow-up Information    Deneise Lever, MD. Go on 02/02/2016.   Specialty:  Pulmonary Disease Why:  you have an appointment at 11:15  Contact information: [place] [address] [phone]          Hospital Course by problem list: Principal Problem:    Asthma exacerbation Active Problems:   Morbid obesity with body mass index of 50.0-59.9 in adult [facility])   Major depressive disorder, recurrent episode ([place])   Essential hypertension   Hypokalemia   Obstructive sleep apnea   GERD (gastroesophageal reflux disease)   Acute Asthma exacerbation  April [name] is a 45 yo woman with a past medical history of severe persistent asthma on extensive home medications with frequent exacerbations related to exertion, seasonal allergies, and cold weather. She presented with 1 week of shortness of breath on exertion, wheeze, non productive cough, and soar throat. Chest xray showed bronchitis and strep cultures were negative and she had diffuse respiratory wheeze on exam. Elevated leukocyte count with neutrophilia  is likely related to her chronic steroid necessity.  Her dyspnea started to improve after albuterol treatment and solumedrol in the ED. This exacerbation was most likely triggered by recent sick contact exposure and URI symptoms. During this hospitalization she was managed with albuterol nebs q2h prn, duonebs q 4h prn,spiriva, dulera, and prednisone 60mg . On the day of discharged her work of breathing had improved significantly and she had a slight expiratory wheeze on exam, she felt that her symptoms had improved faster than most other times that she had exacerbations. She was discharged with an appointment for close follow up with pulmonology and plans to continue the prednisone taper that her pulmonologist had prescribed outpatient. Our pharmacologist reviewed the medications that she was taking with Ms. Butrum and filled out an asthma exacerbation plan worksheet with her. She was given an extra inhaler spacer.   Discharge Vitals:   BP (!) 153/77 (BP Location: Left Arm)   Pulse 88   Temp 98.2 F (36.8 C) (Oral)   Resp 18   Ht 5\' 6"  (1.676 m)   Wt (!) 310 lb (140.6 kg)   LMP 01/23/2016   SpO2 94%   BMI 50.04 kg/m   Procedures Performed:  Dg Chest 2  View  Result Date: 01/30/2016 CLINICAL DATA:  Shortness of breath an wheezing over the last week. EXAM: CHEST  2 VIEW COMPARISON:  12/18/2015 FINDINGS: Heart size is normal. Mediastinal shadows are normal. There may be central bronchial thickening but there is no infiltrate, collapse or effusion. No bony abnormality. IMPRESSION: Bronchitis.  No consolidation or collapse. Electronically Signed   By: Nelson Chimes M.D.   On: 01/30/2016 14:25   Discharge Instructions: Discharge Instructions    Call MD for:  difficulty breathing, headache or visual disturbances    Complete by:  As directed    Call MD for:  persistant dizziness or light-headedness    Complete by:  As directed    Call MD for:  temperature >100.4    Complete by:  As directed    Diet - low sodium heart healthy  Complete by:  As directed    Increase activity slowly    Complete by:  As directed       Signed: Ledell Noss, MD 02/02/2016, 8:12 AM   Pager: [phone]

## 2016-02-01 NOTE — Telephone Encounter (Signed)
Attempted to contact pt. No answer, no option to leave a message. Will try back.  

## 2016-02-02 ENCOUNTER — Inpatient Hospital Stay: Payer: Self-pay | Admitting: Internal Medicine

## 2016-02-02 LAB — CULTURE, GROUP A STREP (THRC)

## 2016-02-02 NOTE — Telephone Encounter (Signed)
Pt rescheduled for Monday 02/05/16 at 3:15 with TP. Nothing further needed.

## 2016-02-02 NOTE — Telephone Encounter (Signed)
Patient canceled appointment for hospital f/u today - states her transportation canceled . She would like to reschedule - pr

## 2016-02-02 NOTE — Telephone Encounter (Signed)
April [name], please advise when/where pt's HFU can be scheduled.  Thank you.

## 2016-02-02 NOTE — Telephone Encounter (Signed)
#   Vials:4 Arrival Date:02/02/16 Lot [id number] Exp Date:3/21

## 2016-02-02 NOTE — Telephone Encounter (Signed)
Pt can be seen by NP for HFU. Thanks .

## 2016-02-05 ENCOUNTER — Inpatient Hospital Stay: Payer: Self-pay | Admitting: Adult Health

## 2016-02-13 ENCOUNTER — Ambulatory Visit (INDEPENDENT_AMBULATORY_CARE_PROVIDER_SITE_OTHER): Payer: Self-pay

## 2016-02-13 DIAGNOSIS — J4520 Mild intermittent asthma, uncomplicated: Secondary | ICD-10-CM

## 2016-02-13 MED ORDER — OMALIZUMAB 150 MG ~~LOC~~ SOLR
225.0000 mg | SUBCUTANEOUS | Status: DC
  Administered 2016-02-13: 225 mg via SUBCUTANEOUS

## 2016-02-24 IMAGING — DX DG CHEST 2V
2 series · 2 of 2 positions shown · non-contrast
Comparison: 01/08/2015

CLINICAL DATA: Shortness breath, cough, wheezing.  Prior smoker.

EXAM:
CHEST  2 VIEW

[chest pa]
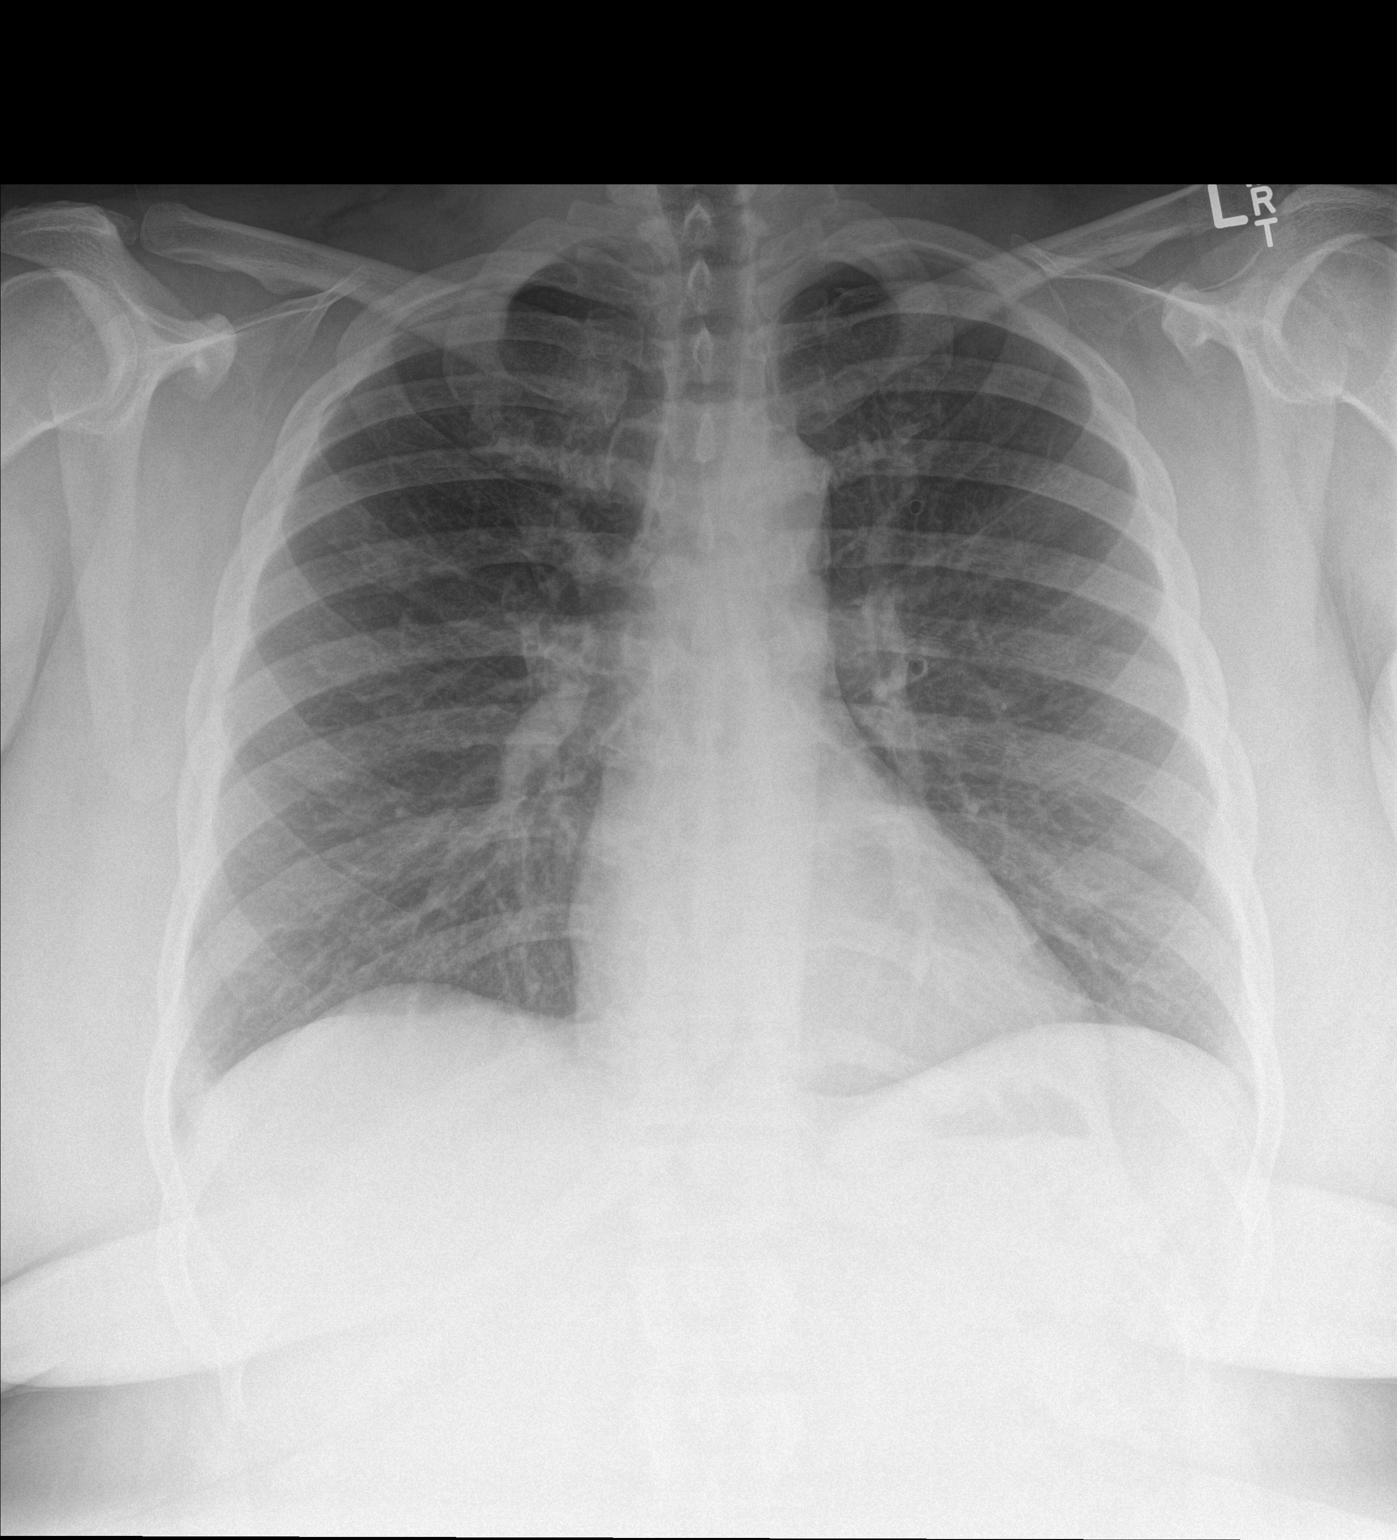

[chest lat]
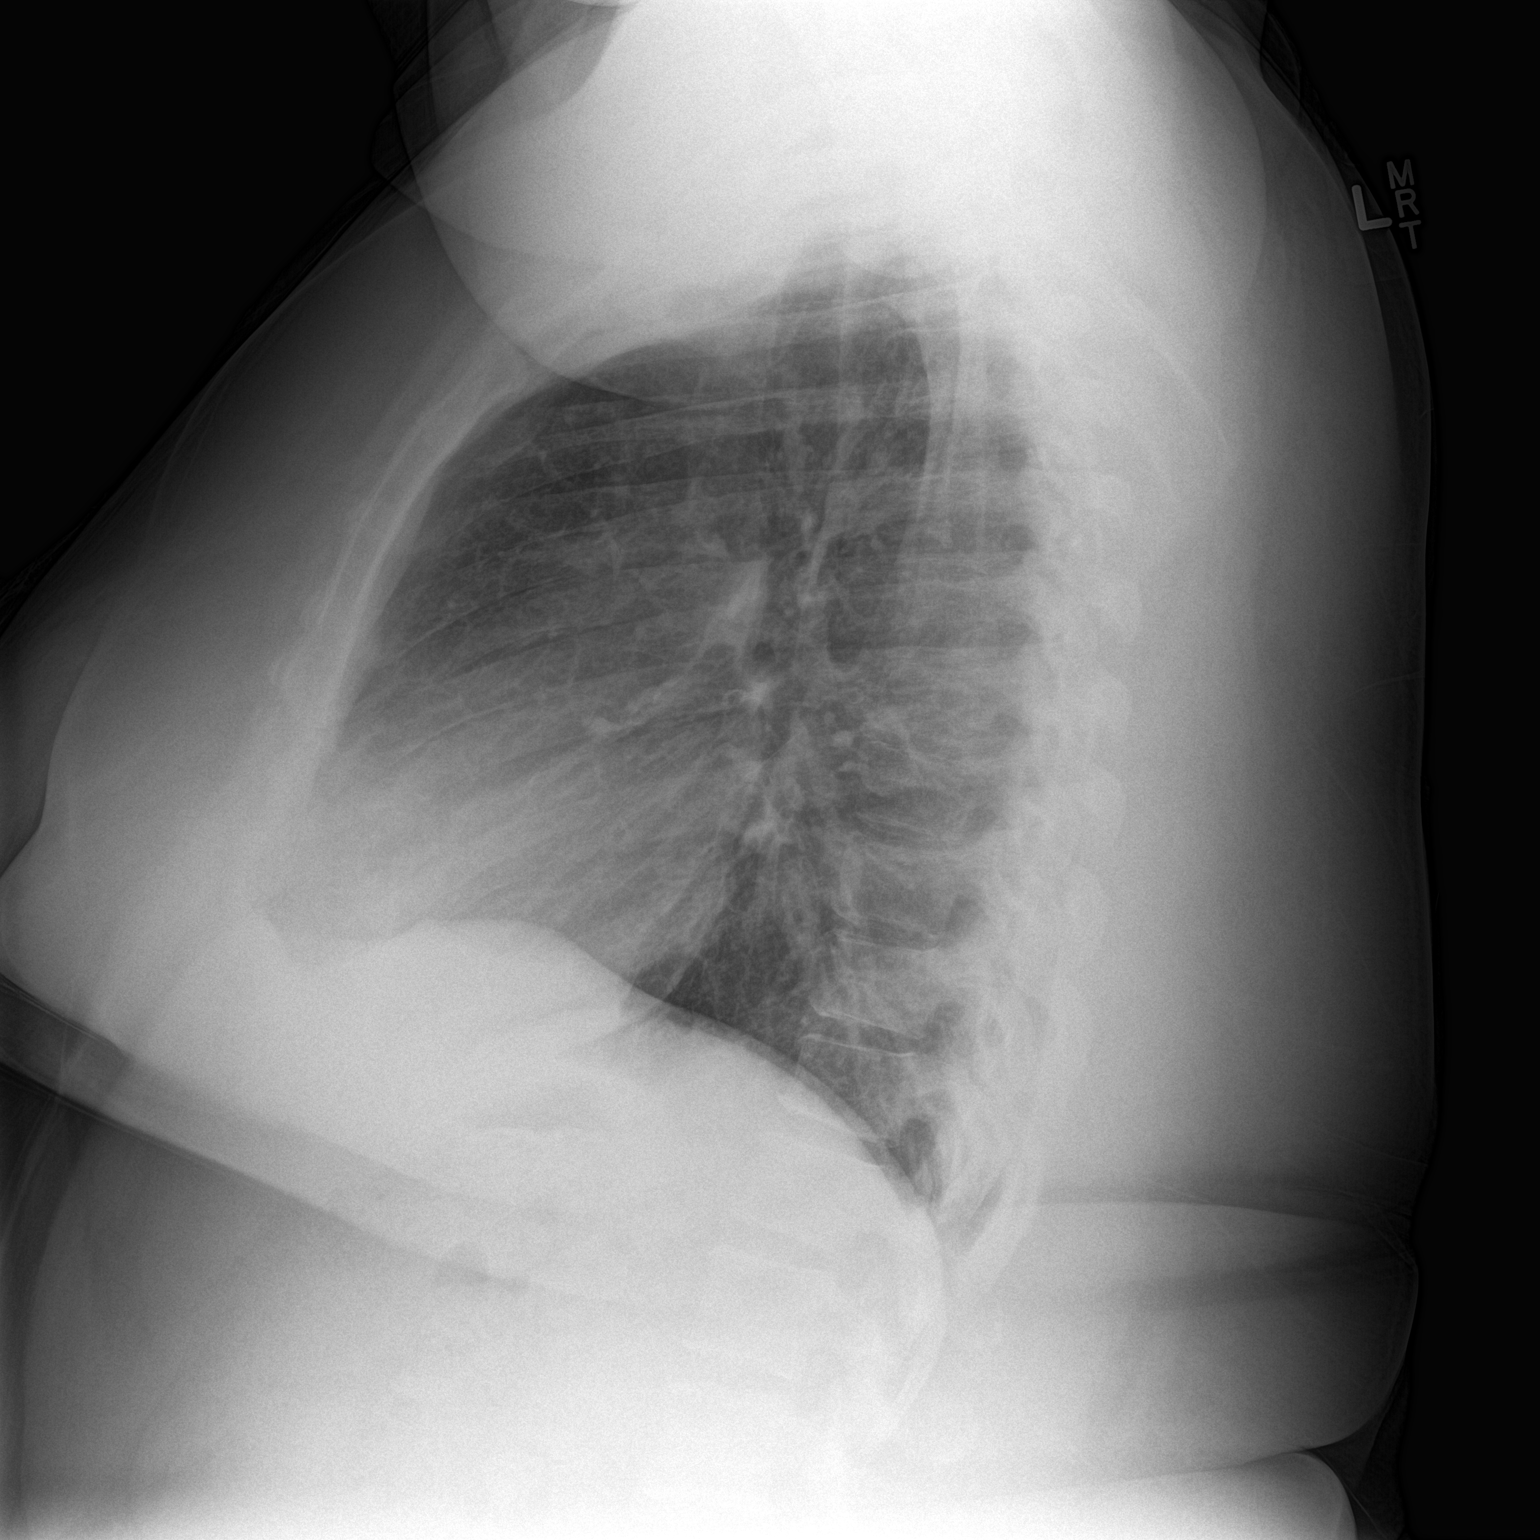

[2 of 2 positions shown; findings below may reference images not displayed]

FINDINGS: Mild chronic peribronchial thickening. Heart and mediastinal
contours are within normal limits. No focal opacities or effusions.
No acute bony abnormality.
IMPRESSION: Mild chronic bronchitic changes.

## 2016-02-26 MED FILL — VENTOLIN HFA 90 MCG INHALER: 108 (90 BAS | 30 days supply | Qty: 18 | Fill #2

## 2016-02-27 ENCOUNTER — Ambulatory Visit (INDEPENDENT_AMBULATORY_CARE_PROVIDER_SITE_OTHER): Payer: Self-pay

## 2016-02-27 ENCOUNTER — Other Ambulatory Visit: Payer: Self-pay | Admitting: Critical Care Medicine

## 2016-02-27 DIAGNOSIS — J449 Chronic obstructive pulmonary disease, unspecified: Secondary | ICD-10-CM

## 2016-02-27 DIAGNOSIS — J4520 Mild intermittent asthma, uncomplicated: Secondary | ICD-10-CM

## 2016-02-27 MED FILL — ALBUTEROL 0.083% INHAL SOLN: (2.5 MG/3ML | 10 days supply | Qty: 180 | Fill #1

## 2016-02-28 ENCOUNTER — Encounter (HOSPITAL_COMMUNITY): Payer: Self-pay

## 2016-02-28 ENCOUNTER — Emergency Department (HOSPITAL_COMMUNITY): Payer: Medicaid Other

## 2016-02-28 ENCOUNTER — Emergency Department (HOSPITAL_COMMUNITY)
Admission: EM | Admit: 2016-02-28 | Discharge: 2016-02-28 | Disposition: A | Discharge status: Home / Self Care | Payer: Medicaid Other | Attending: Emergency Medicine | Admitting: Emergency Medicine

## 2016-02-28 DIAGNOSIS — J45901 Unspecified asthma with (acute) exacerbation: Secondary | ICD-10-CM | POA: Diagnosis not present

## 2016-02-28 DIAGNOSIS — I10 Essential (primary) hypertension: Secondary | ICD-10-CM | POA: Insufficient documentation

## 2016-02-28 DIAGNOSIS — R0602 Shortness of breath: Secondary | ICD-10-CM | POA: Diagnosis present

## 2016-02-28 DIAGNOSIS — K122 Cellulitis and abscess of mouth: Secondary | ICD-10-CM | POA: Diagnosis not present

## 2016-02-28 DIAGNOSIS — K121 Other forms of stomatitis: Secondary | ICD-10-CM

## 2016-02-28 DIAGNOSIS — Z87891 Personal history of nicotine dependence: Secondary | ICD-10-CM | POA: Insufficient documentation

## 2016-02-28 LAB — CBC WITH DIFFERENTIAL/PLATELET
Basophils Absolute: 0 10*3/uL (ref 0.0–0.1)
Basophils Relative: 0 %
EOS ABS: 0.1 10*3/uL (ref 0.0–0.7)
Eosinophils Relative: 0 %
HCT: 39.5 % (ref 36.0–46.0)
HEMOGLOBIN: 12.5 g/dL (ref 12.0–15.0)
LYMPHS ABS: 2.6 10*3/uL (ref 0.7–4.0)
LYMPHS PCT: 14 %
MCH: 27.7 pg (ref 26.0–34.0)
MCHC: 31.6 g/dL (ref 30.0–36.0)
MCV: 87.4 fL (ref 78.0–100.0)
Monocytes Absolute: 1.6 10*3/uL — ABNORMAL HIGH (ref 0.1–1.0)
Monocytes Relative: 8 %
NEUTROS ABS: 15 10*3/uL — AB (ref 1.7–7.7)
NEUTROS PCT: 78 %
Platelets: 316 10*3/uL (ref 150–400)
RBC: 4.52 MIL/uL (ref 3.87–5.11)
RDW: 19.1 % — ABNORMAL HIGH (ref 11.5–15.5)
WBC: 19.3 10*3/uL — AB (ref 4.0–10.5)

## 2016-02-28 LAB — BASIC METABOLIC PANEL
Anion gap: 9 (ref 5–15)
BUN: 10 mg/dL (ref 6–20)
CHLORIDE: 99 mmol/L — AB (ref 101–111)
CO2: 30 mmol/L (ref 22–32)
Calcium: 8.7 mg/dL — ABNORMAL LOW (ref 8.9–10.3)
Creatinine, Ser: 0.88 mg/dL (ref 0.44–1.00)
GFR calc Af Amer: >60 mL/min (ref >60)
GFR calc non Af Amer: >60 mL/min (ref >60)
Glucose, Bld: 99 mg/dL (ref 65–99)
POTASSIUM: 3.4 mmol/L — AB (ref 3.5–5.1)
SODIUM: 138 mmol/L (ref 135–145)

## 2016-02-28 LAB — I-STAT TROPONIN, ED: TROPONIN I, POC: 0.03 ng/mL (ref 0.00–0.08)

## 2016-02-28 LAB — RAPID STREP SCREEN (MED CTR MEBANE ONLY): STREPTOCOCCUS, GROUP A SCREEN (DIRECT): NEGATIVE

## 2016-02-28 LAB — BRAIN NATRIURETIC PEPTIDE: B Natriuretic Peptide: 21.4 pg/mL (ref 0.0–100.0)

## 2016-02-28 MED ORDER — AEROCHAMBER PLUS W/MASK MISC
1.0000 | Freq: Once | Status: AC
  Administered 2016-02-28: 1
  Filled 2016-02-28: qty 1

## 2016-02-28 MED ORDER — ACETAMINOPHEN 325 MG PO TABS
650.0000 mg | ORAL_TABLET | Freq: Once | ORAL | Status: AC
  Administered 2016-02-28: 650 mg via ORAL
  Filled 2016-02-28: qty 2

## 2016-02-28 MED ORDER — METHYLPREDNISOLONE SODIUM SUCC 125 MG IJ SOLR
125.0000 mg | Freq: Once | INTRAMUSCULAR | Status: AC
Start: 2016-02-28 — End: 2016-02-28
  Administered 2016-02-28: 125 mg via INTRAVENOUS
  Filled 2016-02-28: qty 2

## 2016-02-28 MED ORDER — ALBUTEROL (5 MG/ML) CONTINUOUS INHALATION SOLN
10.0000 mg/h | INHALATION_SOLUTION | Freq: Once | RESPIRATORY_TRACT | Status: AC
  Administered 2016-02-28: 10 mg/h via RESPIRATORY_TRACT
  Filled 2016-02-28: qty 20

## 2016-02-28 MED ORDER — ALBUTEROL SULFATE (2.5 MG/3ML) 0.083% IN NEBU
5.0000 mg | INHALATION_SOLUTION | Freq: Once | RESPIRATORY_TRACT | Status: AC
  Administered 2016-02-28: 5 mg via RESPIRATORY_TRACT
  Filled 2016-02-28: qty 6

## 2016-02-28 MED ORDER — LIDOCAINE VISCOUS 2 % MT SOLN
15.0000 mL | Freq: Once | OROMUCOSAL | Status: AC
  Administered 2016-02-28: 15 mL via OROMUCOSAL
  Filled 2016-02-28: qty 15

## 2016-02-28 MED ORDER — OMALIZUMAB 150 MG ~~LOC~~ SOLR
225.0000 mg | SUBCUTANEOUS | Status: DC
  Administered 2016-02-27: 225 mg via SUBCUTANEOUS

## 2016-02-28 MED ORDER — IPRATROPIUM BROMIDE 0.02 % IN SOLN
0.5000 mg | Freq: Once | RESPIRATORY_TRACT | Status: AC
  Administered 2016-02-28: 0.5 mg via RESPIRATORY_TRACT
  Filled 2016-02-28: qty 2.5

## 2016-02-28 MED ORDER — PREDNISONE 20 MG PO TABS: 40.0000 mg | ORAL_TABLET | Freq: Every day | ORAL | 0 refills | Status: DC

## 2016-02-28 MED ORDER — SODIUM CHLORIDE 0.9 % IV BOLUS (SEPSIS)
500.0000 mL | Freq: Once | INTRAVENOUS | Status: AC
  Administered 2016-02-28: 500 mL via INTRAVENOUS

## 2016-02-28 MED ORDER — ALBUTEROL SULFATE HFA 108 (90 BASE) MCG/ACT IN AERS
2.0000 | INHALATION_SPRAY | Freq: Once | RESPIRATORY_TRACT | Status: AC
  Administered 2016-02-28: 2 via RESPIRATORY_TRACT
  Filled 2016-02-28: qty 6.7

## 2016-02-28 NOTE — ED Notes (Signed)
Patient transported to X-ray 

## 2016-02-28 NOTE — ED Provider Notes (Signed)
[facility] DEPT Provider Note   CSN: [id number] Arrival date & time: 02/28/16  0806     History   Chief Complaint No chief complaint on file.   HPI April [name] is a 45 y.o. female.  HPI April [name] is a 45 y.o. female with history of asthma, morbid obesity, acid reflux, hypertension, presents to emergency department with complaint of shortness of breath, asthma exacerbation, and pain to the right mouth. Patient states that her shortness of breath worsened overnight. She states she has had issues with her asthma in the last several days. She has had multiple visits to the ED for the same. She normally uses breathing treatments at home, and states she uses her inhaler when she is at work. Patient works at [employer]. She states she worked overnight and states that she has had increased shortness of breath, used her inhaler with no relief. She came straight from work. She is also complaining of painful area to her right side of the mouth. Patient states she wears dentures, she has no teeth in that area. She reports sensation of her face swelling. She denies any fever or chills. She states she does have sore throat on that side and has pain with swallowing. Denies any treatment for this prior to coming in.  Past Medical History:  Diagnosis Date  . Acanthosis nigricans   . Arthritis   . Asthma    Followed by Dr. Annamaria Boots (pulmonology); receives every other week omalizumab injections; has frequent exacerbations  . COPD (chronic obstructive pulmonary disease) ([place])    PFTs in 2002, FEV1/FVC 65, no post bronchodilater test done  . Depression   . GERD (gastroesophageal reflux disease)   . Headache(784.0)   . Helicobacter pylori (H. pylori) infection   . Hypertension   . Hypertension, essential   . Insomnia   . Menorrhagia   . Morbid obesity ([place])   . Obesity   . Seasonal allergies   . Shortness of breath   . Sleep apnea    Sleep study 2008 - mild OSA, not enough events  to titrate CPAP  . Tobacco user     Patient Active Problem List   Diagnosis Date Noted  . Asthma exacerbation 11/10/2015  . GERD (gastroesophageal reflux disease) 08/30/2015  . Generalized anxiety disorder 08/30/2015  . Seasonal allergic rhinitis 08/29/2013  . Asthma, chronic obstructive, without status asthmaticus ([place]) 05/07/2012  . Knee pain, bilateral 04/25/2011  . Obstructive sleep apnea 12/19/2010  . Hypokalemia 08/13/2010  . Cervical back pain with evidence of disc disease 04/08/2008  . Essential hypertension 07/31/2006  . Morbid obesity with body mass index of 50.0-59.9 in adult ([place]) 06/17/2006  . Major depressive disorder, recurrent episode ([place]) 04/10/2006    Past Surgical History:  Procedure Laterality Date  . BREAST REDUCTION SURGERY  09/2011  . TUBAL LIGATION  1996   bilateral    OB History    No data available       Home Medications    Prior to Admission medications   Medication Sig Start Date End Date Taking? Authorizing Provider  acetaminophen (TYLENOL) 500 MG tablet Take 2 tablets (1,000 mg total) by mouth every 6 (six) hours as needed. Patient taking differently: Take 1,000 mg by mouth every 6 (six) hours as needed for mild pain.  08/30/15   [name], MD  albuterol (PROVENTIL) (2.5 MG/3ML) 0.083% nebulizer solution Take 3 mLs (2.5 mg total) by nebulization every 4 (four) hours as needed for wheezing  or shortness of breath. 12/27/15   Elsie Stain, MD  albuterol (VENTOLIN HFA) 108 (90 Base) MCG/ACT inhaler INHALE 1-2 PUFFS INTO THE LUNGS EVERY 4 HOURS as needed 01/31/16   Ledell Noss, MD  budesonide-formoterol [facility]) 160-4.5 MCG/ACT inhaler Inhale 2 puffs into the lungs 2 (two) times daily. Patient taking differently: Inhale 2 puffs into the lungs 2 (two) times daily as needed (for shortness of breath).  12/27/15 12/26/16  Elsie Stain, MD  DULERA 200-5 MCG/ACT AERO Inhale 2 puffs into the lungs 2 (two) times daily. 10/26/15   Historical Provider,  MD  esomeprazole (NEXIUM) 40 MG capsule Take 40 mg by mouth daily. 08/30/15   Historical Provider, MD  fluticasone (FLONASE) 50 MCG/ACT nasal spray Place 2 sprays into both nostrils daily. 12/27/15   Elsie Stain, MD  guaiFENesin (MUCINEX) 600 MG 12 hr tablet Take 1 tablet (600 mg total) by mouth 2 (two) times daily as needed. 12/21/15   [name], MD  hydrochlorothiazide (HYDRODIURIL) 25 MG tablet Take 1 tablet (25 mg total) by mouth daily. 12/27/15   Elsie Stain, MD  ibuprofen (ADVIL,MOTRIN) 200 MG tablet Take 600 mg by mouth 2 (two) times daily as needed for moderate pain (knee pain).     Historical Provider, MD  ipratropium (ATROVENT) 0.02 % nebulizer solution Take 2.5 mLs (0.5 mg total) by nebulization 4 (four) times daily as needed for wheezing or shortness of breath. 12/27/15   Elsie Stain, MD  loratadine (CLARITIN) 10 MG tablet Take 1 tablet (10 mg total) by mouth daily. 08/30/15   [name], MD  nicotine (NICODERM CQ - DOSED IN MG/24 HOURS) 14 mg/24hr patch Place 14 mg onto the skin 3 (three) times a week.    Historical Provider, MD  omalizumab [place] Right) 150 MG injection Inject 150 mg into the skin every 14 (fourteen) days.    Historical Provider, MD  predniSONE (DELTASONE) 20 MG tablet Take 4 for four days 3 for four days 2 for four days 1 for four days Patient taking differently: Take 40 mg by mouth daily.  01/15/16   Deneise Lever, MD  Pseudoeph-Doxylamine-DM-APAP (NYQUIL PO) Take 15 mLs by mouth as needed (for cough).    Historical Provider, MD  sertraline (ZOLOFT) 50 MG tablet Take 1 tablet (50 mg total) by mouth daily. 08/30/15   [name], MD  Tiotropium Bromide Monohydrate (SPIRIVA RESPIMAT) 2.5 MCG/ACT AERS Inhale 2 puffs into the lungs daily. Patient taking differently: Inhale 2 puffs into the lungs daily as needed (for shortness of breath).  12/27/15   Elsie Stain, MD    Family History Family History  Problem Relation Age of Onset  . Hypertension Mother   .  Asthma Daughter   . Cancer Paternal Aunt   . Asthma Maternal Grandmother     Social History Social History  Substance Use Topics  . Smoking status: Former Smoker    Years: 18.00    Types: Cigarettes    Quit date: 09/15/2014  . Smokeless tobacco: Former Systems developer    Quit date: 09/12/2014  . Alcohol use No     Allergies   Review of patient's allergies indicates no known allergies.   Review of Systems Review of Systems  Constitutional: Negative for chills and fever.  HENT: Positive for mouth sores.   Respiratory: Positive for cough, chest tightness and shortness of breath.   Cardiovascular: Positive for chest pain. Negative for palpitations and leg swelling.  Gastrointestinal: Negative for abdominal pain,  diarrhea, nausea and vomiting.  Genitourinary: Negative for dysuria and flank pain.  Musculoskeletal: Negative for arthralgias, myalgias, neck pain and neck stiffness.  Skin: Negative for rash.  Neurological: Negative for dizziness, weakness and headaches.  All other systems reviewed and are negative.    Physical Exam Updated Vital Signs BP 165/88   Pulse (!) 122   Temp 98.4 F (36.9 C) (Oral)   Resp 21   Ht 5\' 6"  (1.676 m)   Wt (!) 140.6 kg   LMP 01/23/2016   SpO2 99%   BMI 50.04 kg/m   Physical Exam  Constitutional: She appears well-developed and well-nourished. No distress.  HENT:  Head: Normocephalic.  Ulceration with exudate under right back tongue, ttp. Tonsils enlarged bilaterally with exudate. Uvula midline  Eyes: Conjunctivae are normal.  Neck: Neck supple.  Cardiovascular: Normal rate, regular rhythm and normal heart sounds.   Pulmonary/Chest: No respiratory distress. She has wheezes. She has no rales.  Audible inspiratory and expiratory wheezing bilaterally  Abdominal: Soft. Bowel sounds are normal. She exhibits no distension. There is no tenderness. There is no rebound.  Musculoskeletal: She exhibits no edema.  Neurological: She is alert.  Skin: Skin  is warm and dry.  Psychiatric: She has a normal mood and affect. Her behavior is normal.  Nursing note and vitals reviewed.    ED Treatments / Results  Labs (all labs ordered are listed, but only abnormal results are displayed) Labs Reviewed  CBC WITH DIFFERENTIAL/PLATELET - Abnormal; Notable for the following:       Result Value   WBC 19.3 (*)    RDW 19.1 (*)    Neutro Abs 15.0 (*)    Monocytes Absolute 1.6 (*)    All other components within normal limits  BASIC METABOLIC PANEL - Abnormal; Notable for the following:    Potassium 3.4 (*)    Chloride 99 (*)    Calcium 8.7 (*)    All other components within normal limits  RAPID STREP SCREEN (NOT AT [facility])  CULTURE, GROUP A STREP [facility])  BRAIN NATRIURETIC PEPTIDE  I-STAT TROPOININ, ED    EKG  EKG Interpretation None       Radiology Dg Chest 2 View  Result Date: 02/28/2016 CLINICAL DATA:  Shortness of Breath EXAM: CHEST  2 VIEW COMPARISON:  January 30, 2016 FINDINGS: The lungs are clear. The heart size and pulmonary vascularity are normal. No adenopathy. No pneumothorax. No bone lesions. IMPRESSION: No abnormality noted. Electronically Signed   By: Lowella Grip III M.D.   On: 02/28/2016 09:17    Procedures Procedures (including critical care time)  Medications Ordered in ED Medications  lidocaine (XYLOCAINE) 2 % viscous mouth solution 15 mL (not administered)  albuterol (PROVENTIL) (2.5 MG/3ML) 0.083% nebulizer solution 5 mg (5 mg Nebulization Given 02/28/16 0846)     Initial Impression / Assessment and Plan / ED Course  I have reviewed the triage vital signs and the nursing notes.  Pertinent labs & imaging results that were available during my care of the patient were reviewed by me and considered in my medical decision making (see chart for details).  Clinical Course   Patient with asthma exacerbation, wheezing and decreased air movement noted bilaterally on exam. This is a recurrent issue, patient's multiple  admissions in the past for the same. Has never been intubated. Patient is in acute distress. She is tachycardic, states could be dehydrated, just got off a third shift at work. We'll give some IV fluids, continues breathing treatment  ordered, Solu-Medrol ordered. Patient is also complaining of an ulcer to her mouth, lidocaine applied. She will need to follow-up with family doctor to make sure the ulceration is resolving or get biopsy.  11:58 AM Patient received hour-long treatment. She states she feels much better. She is tachycardic still, history of the same, especially with breathing treatments. She wants to be discharged home. She states that she does not think she needs to be admitted at this time. I will discharge her home with a prednisone burst. Continue inhalers. Patient has very large tongue and tonsils, and I am wondering if her inhaler is not working if she is not inhaling the medication but actually spraying inside her mouth. I did provide her with a spacer. Recommended close follow-up with her primary care doctor and return precautions discussed. Patient voiced understanding.  Vitals:   02/28/16 1021 02/28/16 1030 02/28/16 1100 02/28/16 1112  BP:  154/96 171/91   Pulse: 105 113  105  Resp:      Temp:      TempSrc:      SpO2: 100% 100%  100%  Weight:      Height:         Final Clinical Impressions(s) / ED Diagnoses   Final diagnoses:  Exacerbation of asthma, unspecified asthma severity, unspecified whether persistent  Mouth ulcer    New Prescriptions New Prescriptions   PREDNISONE (DELTASONE) 20 MG TABLET    Take 2 tablets (40 mg total) by mouth daily.     Jeannett Senior, PA-C 02/28/16 [place], MD 03/01/16 [phone]

## 2016-02-28 NOTE — ED Triage Notes (Signed)
Patient complains of increased congestion with cough and asthma exacerbation x 2 days. Also complains of right sided gum pain, wheezing on arrival

## 2016-02-28 NOTE — ED Notes (Signed)
EKG given to Dr. Yelverton 

## 2016-02-28 NOTE — Discharge Instructions (Signed)
Continue inhaler with spacer or breathing treatment every 4 hours. Take prednisone as prescribed until all gone, next dose tomorrow. Follow-up with primary care doctor. Return if worsening.

## 2016-02-29 ENCOUNTER — Telehealth: Payer: Self-pay

## 2016-03-01 ENCOUNTER — Ambulatory Visit: Payer: Self-pay | Admitting: Pharmacist

## 2016-03-01 LAB — CULTURE, GROUP A STREP (THRC)

## 2016-03-01 NOTE — Telephone Encounter (Signed)
Patient was contacted with Frank Tillman, PharmD candidate. I agree with the assessment and plan of care documented.  

## 2016-03-04 ENCOUNTER — Telehealth: Payer: Self-pay | Admitting: Internal Medicine

## 2016-03-04 NOTE — Telephone Encounter (Signed)
#   vials:4 Ordered date:03/04/16 Shipping Date:03/05/16 Faxed infusion form, asked xolair be delivered 03/06/16.

## 2016-03-06 ENCOUNTER — Telehealth: Payer: Self-pay | Admitting: Internal Medicine

## 2016-03-06 NOTE — Telephone Encounter (Signed)
April [name] is needing a new [employer] form to be filled out and faxed back, as pt's case is expiring on 03/20/2016.  Requested new form be faxed to office.  Will await fax for katie to fill out.   Also set up shipment for pt's next xolair- # vials:4 Ordered date:03/06/16  Shipping Date:03/08/2016

## 2016-03-08 NOTE — Telephone Encounter (Signed)
#   Vials:4 Arrival Date:03/08/16 Lot [id number] Exp Date:4/21

## 2016-03-11 ENCOUNTER — Ambulatory Visit (HOSPITAL_COMMUNITY)
Admission: EM | Admit: 2016-03-11 | Discharge: 2016-03-11 | Disposition: A | Discharge status: Home / Self Care | Payer: Self-pay | Attending: Emergency Medicine | Admitting: Emergency Medicine

## 2016-03-11 ENCOUNTER — Encounter (HOSPITAL_COMMUNITY): Payer: Self-pay | Admitting: Emergency Medicine

## 2016-03-11 DIAGNOSIS — S46911A Strain of unspecified muscle, fascia and tendon at shoulder and upper arm level, right arm, initial encounter: Secondary | ICD-10-CM

## 2016-03-11 DIAGNOSIS — S40021A Contusion of right upper arm, initial encounter: Secondary | ICD-10-CM

## 2016-03-11 DIAGNOSIS — S86912A Strain of unspecified muscle(s) and tendon(s) at lower leg level, left leg, initial encounter: Secondary | ICD-10-CM

## 2016-03-11 DIAGNOSIS — W19XXXA Unspecified fall, initial encounter: Secondary | ICD-10-CM

## 2016-03-11 MED ORDER — TRAMADOL HCL 50 MG PO TABS: ORAL_TABLET | ORAL | 0 refills | Status: DC

## 2016-03-11 MED ORDER — KETOROLAC TROMETHAMINE 60 MG/2ML IM SOLN
60.0000 mg | Freq: Once | INTRAMUSCULAR | Status: AC
  Administered 2016-03-11: 60 mg via INTRAMUSCULAR

## 2016-03-11 MED ORDER — KETOROLAC TROMETHAMINE 60 MG/2ML IM SOLN
INTRAMUSCULAR | Status: AC
  Filled 2016-03-11: qty 2

## 2016-03-11 MED ORDER — NAPROXEN 375 MG PO TABS: 375.0000 mg | ORAL_TABLET | Freq: Two times a day (BID) | ORAL | 0 refills | Status: DC

## 2016-03-11 NOTE — Discharge Instructions (Signed)
Wear the Ace bandage around the left knee for the next 2-3 days. May remove it periodically as needed for bathing and movement or stretching. Ice off and on for 2 or 3 times a day applied to the knee and right shoulder. After a couple of days of ice then start using heat to these areas. Take medications as directed. If he feel like you are not getting better or you are worse follow-up with the orthopedist listed on this page. Call for appointment.

## 2016-03-11 NOTE — ED Provider Notes (Signed)
CSN: [id number]     Arrival date & time 03/11/16  1408 History   First MD Initiated Contact with Patient 03/11/16 1621     Chief Complaint  Patient presents with  . Fall   (Consider location/radiation/quality/duration/timing/severity/associated sxs/prior Treatment) 45 year old severely and morbidly obese female was walking and slipped down a wet dressing all injuring her left knee and right shoulder. This occurred 3 days ago. She has since been ambulatory and utilizing her extremities, bearing weight. She demonstrates ability to provide full range of motion of the right shoulder and weightbearing of the left knee. She states most of the pain is in the left knee both at rest and with ambulation. Denies injury elsewhere. No injury to the head, neck, chest, back or right lower extremity.      Past Medical History:  Diagnosis Date  . Acanthosis nigricans   . Arthritis   . Asthma    Followed by Dr. Annamaria Boots (pulmonology); receives every other week omalizumab injections; has frequent exacerbations  . COPD (chronic obstructive pulmonary disease) ([place])    PFTs in 2002, FEV1/FVC 65, no post bronchodilater test done  . Depression   . GERD (gastroesophageal reflux disease)   . Headache(784.0)   . Helicobacter pylori (H. pylori) infection   . Hypertension   . Hypertension, essential   . Insomnia   . Menorrhagia   . Morbid obesity ([place])   . Obesity   . Seasonal allergies   . Shortness of breath   . Sleep apnea    Sleep study 2008 - mild OSA, not enough events to titrate CPAP  . Tobacco user    Past Surgical History:  Procedure Laterality Date  . BREAST REDUCTION SURGERY  09/2011  . TUBAL LIGATION  1996   bilateral   Family History  Problem Relation Age of Onset  . Hypertension Mother   . Asthma Daughter   . Cancer Paternal Aunt   . Asthma Maternal Grandmother    Social History  Substance Use Topics  . Smoking status: Former Smoker    Years: 18.00    Types: Cigarettes    Quit  date: 09/15/2014  . Smokeless tobacco: Never Used  . Alcohol use No   OB History    No data available     Review of Systems  Constitutional: Negative for activity change, chills and fever.  HENT: Negative.   Respiratory: Negative.   Cardiovascular: Negative.   Musculoskeletal:       As per HPI  Skin: Negative for color change, pallor and rash.  Neurological: Negative.     Allergies  Patient has no known allergies.  Home Medications   Prior to Admission medications   Medication Sig Start Date End Date Taking? Authorizing Provider  hydrochlorothiazide (HYDRODIURIL) 25 MG tablet Take 1 tablet (25 mg total) by mouth daily. 12/27/15  Yes Elsie Stain, MD  acetaminophen (TYLENOL) 500 MG tablet Take 2 tablets (1,000 mg total) by mouth every 6 (six) hours as needed. Patient taking differently: Take 1,000 mg by mouth every 6 (six) hours as needed for mild pain.  08/30/15   [name], MD  albuterol (PROVENTIL) (2.5 MG/3ML) 0.083% nebulizer solution Take 3 mLs (2.5 mg total) by nebulization every 4 (four) hours as needed for wheezing or shortness of breath. 12/27/15   Elsie Stain, MD  albuterol (VENTOLIN HFA) 108 (90 Base) MCG/ACT inhaler INHALE 1-2 PUFFS INTO THE LUNGS EVERY 4 HOURS as needed 01/31/16   Ledell Noss, MD  budesonide-formoterol [facility]) 160-4.5  MCG/ACT inhaler Inhale 2 puffs into the lungs 2 (two) times daily. Patient taking differently: Inhale 2 puffs into the lungs 2 (two) times daily as needed (for shortness of breath).  12/27/15 12/26/16  Elsie Stain, MD  DULERA 200-5 MCG/ACT AERO Inhale 2 puffs into the lungs 2 (two) times daily. 10/26/15   Historical Provider, MD  esomeprazole (NEXIUM) 40 MG capsule Take 40 mg by mouth daily. 08/30/15   Historical Provider, MD  fluticasone (FLONASE) 50 MCG/ACT nasal spray Place 2 sprays into both nostrils daily. 12/27/15   Elsie Stain, MD  guaiFENesin (MUCINEX) 600 MG 12 hr tablet Take 1 tablet (600 mg total) by mouth 2 (two)  times daily as needed. 12/21/15   [name], MD  ibuprofen (ADVIL,MOTRIN) 200 MG tablet Take 600 mg by mouth 2 (two) times daily as needed for moderate pain (knee pain).     Historical Provider, MD  ipratropium (ATROVENT) 0.02 % nebulizer solution Take 2.5 mLs (0.5 mg total) by nebulization 4 (four) times daily as needed for wheezing or shortness of breath. 12/27/15   Elsie Stain, MD  loratadine (CLARITIN) 10 MG tablet Take 1 tablet (10 mg total) by mouth daily. 08/30/15   [name], MD  naproxen (NAPROSYN) 375 MG tablet Take 1 tablet (375 mg total) by mouth 2 (two) times daily. 03/11/16   Janne Napoleon, NP  nicotine (NICODERM CQ - DOSED IN MG/24 HOURS) 14 mg/24hr patch Place 14 mg onto the skin 3 (three) times a week.    Historical Provider, MD  omalizumab Arvid Right) 150 MG injection Inject 150 mg into the skin every 14 (fourteen) days.    Historical Provider, MD  predniSONE (DELTASONE) 20 MG tablet Take 2 tablets (40 mg total) by mouth daily. 02/28/16   Tatyana Kirichenko, PA-C  Pseudoeph-Doxylamine-DM-APAP (NYQUIL PO) Take 15 mLs by mouth as needed (for cough).    Historical Provider, MD  sertraline (ZOLOFT) 50 MG tablet Take 1 tablet (50 mg total) by mouth daily. 08/30/15   [name], MD  Tiotropium Bromide Monohydrate (SPIRIVA RESPIMAT) 2.5 MCG/ACT AERS Inhale 2 puffs into the lungs daily. Patient taking differently: Inhale 2 puffs into the lungs daily as needed (for shortness of breath).  12/27/15   Elsie Stain, MD  traMADol (ULTRAM) 50 MG tablet 1-2 tabs po q 6 hr prn pain Maximum dose= 8 tablets per day 03/11/16   Janne Napoleon, NP   Meds Ordered and Administered this Visit   Medications  ketorolac (TORADOL) injection 60 mg (not administered)    BP 176/92 (BP Location: Right Arm) Comment: has not taken blood presure medicine today Comment (BP Location): regular cuff, forearm  Pulse 98   Temp 98.5 F (36.9 C) (Oral)   SpO2 100%  No data found.   Physical Exam  Constitutional:  She is oriented to person, place, and time. She appears well-developed and well-nourished. No distress.  HENT:  Head: Normocephalic and atraumatic.  Neck: Normal range of motion. Neck supple.  Cardiovascular: Normal rate.   Pulmonary/Chest: Effort normal.  Musculoskeletal: She exhibits no edema or deformity.  Right shoulder with no swelling, asymmetry, discoloration or deformity. Demonstrates ability to abduct completely, rotate the shoulder 360, place arm behind her back and perform internal and external rotation. The primary area of pain is over the deltoid muscle and lesser to the right pectoralis muscle. Tenderness over the same areas. No tenderness over the joint space. Distal neurovascular motor sensory is grossly intact. Strength in the right upper  extremity is 5 over 5.  Patient is able to stand and bear full weight and ambulate on the left knee. When sitting the patient exhibits very strong extension of the knee as well as flexion. Due to body habitus/obesity of the lower extremity is difficult to examine the knee however there is no obvious deformity, discoloration or swelling. Soft tissue is soft, no tension. No tenderness directly over the bony prominences. There is tenderness to the medial aspect of the proximal calf, lateral knee and medial left thigh. Negative drawer, negative varus, negative valgus. No laxity appreciated. Distal neurovascular motor Sentry is intact.  Neurological: She is alert and oriented to person, place, and time.  Skin: Skin is warm and dry.  Psychiatric: She has a normal mood and affect.  Nursing note and vitals reviewed.   Urgent Care Course   Clinical Course     Procedures (including critical care time)  Labs Review Labs Reviewed - No data to display  Imaging Review No results found.   Visual Acuity Review  Right Eye Distance:   Left Eye Distance:   Bilateral Distance:    Right Eye Near:   Left Eye Near:    Bilateral Near:          MDM   1. Fall, initial encounter   2. Arm contusion, right, initial encounter   3. Shoulder strain, right, initial encounter   4. Knee strain, left, initial encounter    Wear the Ace bandage around the left knee for the next 2-3 days. May remove it periodically as needed for bathing and movement or stretching. Ice off and on for 2 or 3 times a day applied to the knee and right shoulder. After a couple of days of ice then start using heat to these areas. Take medications as directed. If he feel like you are not getting better or you are worse follow-up with the orthopedist listed on this page. Call for appointment. Meds ordered this encounter  Medications  . ketorolac (TORADOL) injection 60 mg  . naproxen (NAPROSYN) 375 MG tablet    Sig: Take 1 tablet (375 mg total) by mouth 2 (two) times daily.    Dispense:  20 tablet    Refill:  0    Order Specific Question:   Supervising Provider    Answer:   Melynda Ripple [[id number]]  . traMADol (ULTRAM) 50 MG tablet    Sig: 1-2 tabs po q 6 hr prn pain Maximum dose= 8 tablets per day    Dispense:  24 tablet    Refill:  0    Order Specific Question:   Supervising Provider    Answer:   Melynda Ripple [id number]       Janne Napoleon, NP 03/11/16 1649

## 2016-03-11 NOTE — ED Triage Notes (Signed)
Walking down a hill on wet grass on Friday.  Slipped and fell.  Pain in left knee and right shoulder.

## 2016-03-13 ENCOUNTER — Emergency Department (HOSPITAL_COMMUNITY)
Admission: EM | Admit: 2016-03-13 | Discharge: 2016-03-13 | Disposition: A | Discharge status: Home / Self Care | Payer: Medicaid Other | Attending: Emergency Medicine | Admitting: Emergency Medicine

## 2016-03-13 ENCOUNTER — Emergency Department (HOSPITAL_COMMUNITY): Payer: Medicaid Other

## 2016-03-13 ENCOUNTER — Encounter (HOSPITAL_COMMUNITY): Payer: Self-pay

## 2016-03-13 ENCOUNTER — Emergency Department (HOSPITAL_BASED_OUTPATIENT_CLINIC_OR_DEPARTMENT_OTHER)
Admit: 2016-03-13 | Discharge: 2016-03-13 | Disposition: A | Discharge status: Home / Self Care | Payer: Medicaid Other | Attending: Emergency Medicine | Admitting: Emergency Medicine

## 2016-03-13 ENCOUNTER — Ambulatory Visit (INDEPENDENT_AMBULATORY_CARE_PROVIDER_SITE_OTHER): Payer: Self-pay

## 2016-03-13 DIAGNOSIS — J449 Chronic obstructive pulmonary disease, unspecified: Secondary | ICD-10-CM | POA: Insufficient documentation

## 2016-03-13 DIAGNOSIS — Z87891 Personal history of nicotine dependence: Secondary | ICD-10-CM | POA: Insufficient documentation

## 2016-03-13 DIAGNOSIS — J4550 Severe persistent asthma, uncomplicated: Secondary | ICD-10-CM

## 2016-03-13 DIAGNOSIS — M179 Osteoarthritis of knee, unspecified: Secondary | ICD-10-CM | POA: Diagnosis not present

## 2016-03-13 DIAGNOSIS — M79609 Pain in unspecified limb: Secondary | ICD-10-CM

## 2016-03-13 DIAGNOSIS — M7989 Other specified soft tissue disorders: Secondary | ICD-10-CM

## 2016-03-13 DIAGNOSIS — M7122 Synovial cyst of popliteal space [Baker], left knee: Secondary | ICD-10-CM

## 2016-03-13 DIAGNOSIS — I10 Essential (primary) hypertension: Secondary | ICD-10-CM | POA: Diagnosis not present

## 2016-03-13 DIAGNOSIS — M25562 Pain in left knee: Secondary | ICD-10-CM | POA: Diagnosis present

## 2016-03-13 DIAGNOSIS — M1712 Unilateral primary osteoarthritis, left knee: Secondary | ICD-10-CM

## 2016-03-13 MED ORDER — KETOROLAC TROMETHAMINE 60 MG/2ML IM SOLN
60.0000 mg | Freq: Once | INTRAMUSCULAR | Status: AC
  Administered 2016-03-13: 60 mg via INTRAMUSCULAR
  Filled 2016-03-13: qty 2

## 2016-03-13 MED ORDER — HYDROCODONE-ACETAMINOPHEN 5-325 MG PO TABS: 1.0000 | ORAL_TABLET | ORAL | 0 refills | Status: DC | PRN

## 2016-03-13 NOTE — ED Triage Notes (Signed)
Patient reports having pain in the left knee x 2 weeks. Patient reports that she had a fall 4 days ago. Patient states pain occurs even when resting.

## 2016-03-13 NOTE — Progress Notes (Signed)
VASCULAR LAB PRELIMINARY  PRELIMINARY  PRELIMINARY  PRELIMINARY  Left lower extremity venous duplex completed.    Preliminary report: Left:  No evidence of DVT or superficial thrombosis. There an area of mixed echoes in the popliteal fossa consistent with a complex  Baker's cyst.   Dalin Caldera, RVS 03/13/2016, 11:40 AM

## 2016-03-13 NOTE — ED Provider Notes (Signed)
[facility] DEPT Provider Note   CSN: [id number] Arrival date & time: 03/13/16  [id number]     History   Chief Complaint Chief Complaint  Patient presents with  . Knee Pain    HPI April [name] is a 45 y.o. female.  HPI  45 year old female presents with left knee pain for about 2 weeks. Has progressively worsened. She had a fall a few days ago but states she did not injure her knee during that time. Denies any swelling. Pain hurts all the time, even at rest. Feels similar to when she used to have to get cortisone shots in her knee. No redness. Pain is mostly medial. Denies chest pain or shortness of breath. Has tried naproxen and tramadol that was given to her by urgent care with no relief.  Past Medical History:  Diagnosis Date  . Acanthosis nigricans   . Arthritis   . Asthma    Followed by Dr. Annamaria Boots (pulmonology); receives every other week omalizumab injections; has frequent exacerbations  . COPD (chronic obstructive pulmonary disease) ([place])    PFTs in 2002, FEV1/FVC 65, no post bronchodilater test done  . Depression   . GERD (gastroesophageal reflux disease)   . Headache(784.0)   . Helicobacter pylori (H. pylori) infection   . Hypertension   . Hypertension, essential   . Insomnia   . Menorrhagia   . Morbid obesity ([place])   . Obesity   . Seasonal allergies   . Shortness of breath   . Sleep apnea    Sleep study 2008 - mild OSA, not enough events to titrate CPAP  . Tobacco user     Patient Active Problem List   Diagnosis Date Noted  . Asthma exacerbation 11/10/2015  . GERD (gastroesophageal reflux disease) 08/30/2015  . Generalized anxiety disorder 08/30/2015  . Seasonal allergic rhinitis 08/29/2013  . Asthma, chronic obstructive, without status asthmaticus ([place]) 05/07/2012  . Knee pain, bilateral 04/25/2011  . Obstructive sleep apnea 12/19/2010  . Hypokalemia 08/13/2010  . Cervical back pain with evidence of disc disease 04/08/2008  . Essential hypertension  07/31/2006  . Morbid obesity with body mass index of 50.0-59.9 in adult ([place]) 06/17/2006  . Major depressive disorder, recurrent episode ([place]) 04/10/2006    Past Surgical History:  Procedure Laterality Date  . BREAST REDUCTION SURGERY  09/2011  . TUBAL LIGATION  1996   bilateral    OB History    No data available       Home Medications    Prior to Admission medications   Medication Sig Start Date End Date Taking? Authorizing Provider  acetaminophen (TYLENOL) 500 MG tablet Take 2 tablets (1,000 mg total) by mouth every 6 (six) hours as needed. Patient taking differently: Take 1,000 mg by mouth every 6 (six) hours as needed for mild pain.  08/30/15   [name], MD  albuterol (PROVENTIL) (2.5 MG/3ML) 0.083% nebulizer solution Take 3 mLs (2.5 mg total) by nebulization every 4 (four) hours as needed for wheezing or shortness of breath. 12/27/15   Elsie Stain, MD  albuterol (VENTOLIN HFA) 108 (90 Base) MCG/ACT inhaler INHALE 1-2 PUFFS INTO THE LUNGS EVERY 4 HOURS as needed 01/31/16   Ledell Noss, MD  budesonide-formoterol [facility]) 160-4.5 MCG/ACT inhaler Inhale 2 puffs into the lungs 2 (two) times daily. Patient taking differently: Inhale 2 puffs into the lungs 2 (two) times daily as needed (for shortness of breath).  12/27/15 12/26/16  Elsie Stain, MD  DULERA 200-5 MCG/ACT AERO Inhale 2 puffs  into the lungs 2 (two) times daily. 10/26/15   Historical Provider, MD  esomeprazole (NEXIUM) 40 MG capsule Take 40 mg by mouth daily. 08/30/15   Historical Provider, MD  fluticasone (FLONASE) 50 MCG/ACT nasal spray Place 2 sprays into both nostrils daily. 12/27/15   Elsie Stain, MD  guaiFENesin (MUCINEX) 600 MG 12 hr tablet Take 1 tablet (600 mg total) by mouth 2 (two) times daily as needed. 12/21/15   [name], MD  hydrochlorothiazide (HYDRODIURIL) 25 MG tablet Take 1 tablet (25 mg total) by mouth daily. 12/27/15   Elsie Stain, MD  HYDROcodone-acetaminophen (NORCO) 5-325 MG tablet  Take 1-2 tablets by mouth every 4 (four) hours as needed. 03/13/16   Sherwood Gambler, MD  ibuprofen (ADVIL,MOTRIN) 200 MG tablet Take 600 mg by mouth 2 (two) times daily as needed for moderate pain (knee pain).     Historical Provider, MD  ipratropium (ATROVENT) 0.02 % nebulizer solution Take 2.5 mLs (0.5 mg total) by nebulization 4 (four) times daily as needed for wheezing or shortness of breath. 12/27/15   Elsie Stain, MD  loratadine (CLARITIN) 10 MG tablet Take 1 tablet (10 mg total) by mouth daily. 08/30/15   [name], MD  naproxen (NAPROSYN) 375 MG tablet Take 1 tablet (375 mg total) by mouth 2 (two) times daily. 03/11/16   Janne Napoleon, NP  nicotine (NICODERM CQ - DOSED IN MG/24 HOURS) 14 mg/24hr patch Place 14 mg onto the skin 3 (three) times a week.    Historical Provider, MD  omalizumab Arvid Right) 150 MG injection Inject 150 mg into the skin every 14 (fourteen) days.    Historical Provider, MD  predniSONE (DELTASONE) 20 MG tablet Take 2 tablets (40 mg total) by mouth daily. 02/28/16   Tatyana Kirichenko, PA-C  Pseudoeph-Doxylamine-DM-APAP (NYQUIL PO) Take 15 mLs by mouth as needed (for cough).    Historical Provider, MD  sertraline (ZOLOFT) 50 MG tablet Take 1 tablet (50 mg total) by mouth daily. 08/30/15   [name], MD  Tiotropium Bromide Monohydrate (SPIRIVA RESPIMAT) 2.5 MCG/ACT AERS Inhale 2 puffs into the lungs daily. Patient taking differently: Inhale 2 puffs into the lungs daily as needed (for shortness of breath).  12/27/15   Elsie Stain, MD    Family History Family History  Problem Relation Age of Onset  . Hypertension Mother   . Asthma Daughter   . Cancer Paternal Aunt   . Asthma Maternal Grandmother     Social History Social History  Substance Use Topics  . Smoking status: Former Smoker    Years: 18.00    Types: Cigarettes    Quit date: 09/15/2014  . Smokeless tobacco: Never Used  . Alcohol use No     Allergies   Patient has no known allergies.   Review  of Systems Review of Systems  Constitutional: Negative for fever.  Respiratory: Negative for shortness of breath.   Cardiovascular: Negative for chest pain and leg swelling.  Musculoskeletal: Positive for arthralgias and myalgias. Negative for joint swelling.  All other systems reviewed and are negative.    Physical Exam Updated Vital Signs BP (!) 165/118 (BP Location: Right Arm)   Pulse 110   Temp 97.8 F (36.6 C) (Oral)   Resp 22   Ht 5\' 6"  (1.676 m)   Wt (!) 310 lb (140.6 kg)   LMP 02/28/2016   SpO2 100%   BMI 50.04 kg/m   Physical Exam  Constitutional: She is oriented to person, place, and  time. She appears well-developed and well-nourished.  Morbidly obese  HENT:  Head: Normocephalic and atraumatic.  Right Ear: External ear normal.  Left Ear: External ear normal.  Nose: Nose normal.  Eyes: Right eye exhibits no discharge. Left eye exhibits no discharge.  Cardiovascular: Normal rate and regular rhythm.   Pulses:      Dorsalis pedis pulses are 2+ on the right side, and 2+ on the left side.  Pulmonary/Chest: Effort normal and breath sounds normal.  Abdominal: Soft. There is no tenderness.  Musculoskeletal:       Left knee: She exhibits normal range of motion. Tenderness found.  No obvious decreased ROM with passive/active ROM. No obvious ligament laxity. Tenderness to proximal posterior calf and distal thigh in addition to knee. No obvious swelling. No redness/warmth  Neurological: She is alert and oriented to person, place, and time.  Skin: Skin is warm and dry.  Nursing note and vitals reviewed.    ED Treatments / Results  Labs (all labs ordered are listed, but only abnormal results are displayed) Labs Reviewed - No data to display  EKG  EKG Interpretation None       Radiology Dg Knee Complete 4 Views Left  Result Date: 03/13/2016 CLINICAL DATA:  Left knee pain after recent fall. EXAM: LEFT KNEE - COMPLETE 4+ VIEW COMPARISON:  Radiographs of Sep 14, 2015. FINDINGS: No evidence of fracture, dislocation, or joint effusion. Mild narrowing of patellofemoral space is noted with spurring of superior aspect of the patella. Mild osteophyte formation is noted medially and laterally. Soft tissues are unremarkable. IMPRESSION: Mild degenerative joint disease. No acute abnormality seen the left knee. Electronically Signed   By: Marijo Conception, M.D.   On: 03/13/2016 10:43   VASCULAR LAB PRELIMINARY  PRELIMINARY  PRELIMINARY  PRELIMINARY  Left lower extremity venous duplex completed.    Preliminary report: Left:  No evidence of DVT or superficial thrombosis. There an area of mixed echoes in the popliteal fossa consistent with a complex  Baker's cyst.   SLAUGHTER, [place], RVS 03/13/2016, 11:40 AM  Procedures Procedures (including critical care time)  Medications Ordered in ED Medications  ketorolac (TORADOL) injection 60 mg (60 mg Intramuscular Given 03/13/16 1038)     Initial Impression / Assessment and Plan / ED Course  I have reviewed the triage vital signs and the nursing notes.  Pertinent labs & imaging results that were available during my care of the patient were reviewed by me and considered in my medical decision making (see chart for details).  Clinical Course as of Mar 14 1219  Wed Mar 13, 2016  1015 Given pain in left calf and proximal thigh, will also get DVT u/s in addition to knee xray. Toradol for pain. Probably arthritic related to obesity. Exam difficult to very large legs but I don't see any obvious asymmetry or joint effusion  [SG]    Clinical Course User Index [SG] Sherwood Gambler, MD    Patient's pain is likely combined obesity and arthritis. Suspicion for septic joint is quite low given no obvious effusion, redness, or warmth. Range of motion. Continue NSAIDs, hydrocodone for severe pain, and follow-up with orthopedics. Ultrasound shows no DVT but does show an unruptured Baker's cyst which may be  contributing.  Final Clinical Impressions(s) / ED Diagnoses   Final diagnoses:  Arthritis of left knee  Baker cyst, left    New Prescriptions New Prescriptions   HYDROCODONE-ACETAMINOPHEN (NORCO) 5-325 MG TABLET    Take 1-2 tablets by  mouth every 4 (four) hours as needed.     Sherwood Gambler, MD 03/13/16 [phone]

## 2016-03-16 ENCOUNTER — Encounter (HOSPITAL_COMMUNITY): Payer: Self-pay | Admitting: *Deleted

## 2016-03-16 ENCOUNTER — Ambulatory Visit (HOSPITAL_COMMUNITY)
Admission: EM | Admit: 2016-03-16 | Discharge: 2016-03-16 | Disposition: A | Discharge status: Home / Self Care | Payer: Self-pay | Attending: Emergency Medicine | Admitting: Emergency Medicine

## 2016-03-16 DIAGNOSIS — G8929 Other chronic pain: Secondary | ICD-10-CM

## 2016-03-16 DIAGNOSIS — M25562 Pain in left knee: Secondary | ICD-10-CM

## 2016-03-16 DIAGNOSIS — M1712 Unilateral primary osteoarthritis, left knee: Secondary | ICD-10-CM

## 2016-03-16 MED ORDER — BUPIVACAINE HCL (PF) 0.5 % IJ SOLN
INTRAMUSCULAR | Status: AC
  Filled 2016-03-16: qty 10

## 2016-03-16 MED ORDER — KETOROLAC TROMETHAMINE 60 MG/2ML IM SOLN
60.0000 mg | Freq: Once | INTRAMUSCULAR | Status: AC
  Administered 2016-03-16: 60 mg via INTRAMUSCULAR

## 2016-03-16 MED ORDER — TRIAMCINOLONE ACETONIDE 40 MG/ML IJ SUSP
INTRAMUSCULAR | Status: AC
  Filled 2016-03-16: qty 1

## 2016-03-16 MED ORDER — KETOROLAC TROMETHAMINE 60 MG/2ML IM SOLN
INTRAMUSCULAR | Status: AC
  Filled 2016-03-16: qty 2

## 2016-03-16 NOTE — Discharge Instructions (Signed)
The degeneration of your left knee joint is an ongoing process and will likely get worse. He is very important that you establish with a orthopedist for additional care. If you need additional medication he may also see your primary care provider has he or she can write the same medication as we can.

## 2016-03-16 NOTE — ED Triage Notes (Signed)
l  Knee  Pain   X  3   Weeks  No   Injury    Seen  3  Days  Ago  At  [facility]  3  Days  Ago        Unable  To  See  pcp     For   2  Weeks    Pt is  Here  For the  Pain

## 2016-03-16 NOTE — ED Provider Notes (Signed)
CSN: [id number]     Arrival date & time 03/16/16  1458 History   None    Chief Complaint  Patient presents with  . Knee Pain   (Consider location/radiation/quality/duration/timing/severity/associated sxs/prior Treatment) 45 year old morbidly and severely obese female with tricompartmental osteo-arthritis of the left knee presents to the urgent care for left knee pain. She has presented to the urgent care for this earlier this week and also at [facility]. She said x-rays which showed the degenerative changes but no acute changes or injury. She also had an ultrasound which showed a Baker's cyst but no evidence of DVT. She is complaining of pain tickly with weightbearing. She states it severe and limiting her ambulation.      Past Medical History:  Diagnosis Date  . Acanthosis nigricans   . Arthritis   . Asthma    Followed by Dr. Annamaria Boots (pulmonology); receives every other week omalizumab injections; has frequent exacerbations  . COPD (chronic obstructive pulmonary disease) ([place])    PFTs in 2002, FEV1/FVC 65, no post bronchodilater test done  . Depression   . GERD (gastroesophageal reflux disease)   . Headache(784.0)   . Helicobacter pylori (H. pylori) infection   . Hypertension   . Hypertension, essential   . Insomnia   . Menorrhagia   . Morbid obesity ([place])   . Obesity   . Seasonal allergies   . Shortness of breath   . Sleep apnea    Sleep study 2008 - mild OSA, not enough events to titrate CPAP  . Tobacco user    Past Surgical History:  Procedure Laterality Date  . BREAST REDUCTION SURGERY  09/2011  . TUBAL LIGATION  1996   bilateral   Family History  Problem Relation Age of Onset  . Hypertension Mother   . Asthma Daughter   . Cancer Paternal Aunt   . Asthma Maternal Grandmother    Social History  Substance Use Topics  . Smoking status: Former Smoker    Years: 18.00    Types: Cigarettes    Quit date: 09/15/2014  . Smokeless tobacco: Never Used  .  Alcohol use No   OB History    No data available     Review of Systems  Constitutional: Negative for activity change, chills and fever.  HENT: Negative.   Respiratory: Negative.   Cardiovascular: Negative.   Musculoskeletal:       As per HPI  Skin: Negative for color change, pallor and rash.  Neurological: Negative.   All other systems reviewed and are negative.   Allergies  Patient has no known allergies.  Home Medications   Prior to Admission medications   Medication Sig Start Date End Date Taking? Authorizing Provider  acetaminophen (TYLENOL) 500 MG tablet Take 2 tablets (1,000 mg total) by mouth every 6 (six) hours as needed. Patient taking differently: Take 1,000 mg by mouth every 6 (six) hours as needed for mild pain.  08/30/15   [name], MD  albuterol (PROVENTIL) (2.5 MG/3ML) 0.083% nebulizer solution Take 3 mLs (2.5 mg total) by nebulization every 4 (four) hours as needed for wheezing or shortness of breath. 12/27/15   Elsie Stain, MD  albuterol (VENTOLIN HFA) 108 (90 Base) MCG/ACT inhaler INHALE 1-2 PUFFS INTO THE LUNGS EVERY 4 HOURS as needed 01/31/16   Ledell Noss, MD  budesonide-formoterol [facility]) 160-4.5 MCG/ACT inhaler Inhale 2 puffs into the lungs 2 (two) times daily. Patient taking differently: Inhale 2 puffs into the lungs 2 (two) times daily as  needed (for shortness of breath).  12/27/15 12/26/16  Elsie Stain, MD  DULERA 200-5 MCG/ACT AERO Inhale 2 puffs into the lungs 2 (two) times daily. 10/26/15   Historical Provider, MD  esomeprazole (NEXIUM) 40 MG capsule Take 40 mg by mouth daily. 08/30/15   Historical Provider, MD  fluticasone (FLONASE) 50 MCG/ACT nasal spray Place 2 sprays into both nostrils daily. 12/27/15   Elsie Stain, MD  guaiFENesin (MUCINEX) 600 MG 12 hr tablet Take 1 tablet (600 mg total) by mouth 2 (two) times daily as needed. 12/21/15   [name], MD  hydrochlorothiazide (HYDRODIURIL) 25 MG tablet Take 1 tablet (25 mg total) by  mouth daily. 12/27/15   Elsie Stain, MD  HYDROcodone-acetaminophen (NORCO) 5-325 MG tablet Take 1-2 tablets by mouth every 4 (four) hours as needed. 03/13/16   Sherwood Gambler, MD  ibuprofen (ADVIL,MOTRIN) 200 MG tablet Take 600 mg by mouth 2 (two) times daily as needed for moderate pain (knee pain).     Historical Provider, MD  ipratropium (ATROVENT) 0.02 % nebulizer solution Take 2.5 mLs (0.5 mg total) by nebulization 4 (four) times daily as needed for wheezing or shortness of breath. 12/27/15   Elsie Stain, MD  loratadine (CLARITIN) 10 MG tablet Take 1 tablet (10 mg total) by mouth daily. 08/30/15   [name], MD  naproxen (NAPROSYN) 375 MG tablet Take 1 tablet (375 mg total) by mouth 2 (two) times daily. 03/11/16   Janne Napoleon, NP  nicotine (NICODERM CQ - DOSED IN MG/24 HOURS) 14 mg/24hr patch Place 14 mg onto the skin 3 (three) times a week.    Historical Provider, MD  omalizumab [place] Right) 150 MG injection Inject 150 mg into the skin every 14 (fourteen) days.    Historical Provider, MD  predniSONE (DELTASONE) 20 MG tablet Take 2 tablets (40 mg total) by mouth daily. 02/28/16   Tatyana Kirichenko, PA-C  Pseudoeph-Doxylamine-DM-APAP (NYQUIL PO) Take 15 mLs by mouth as needed (for cough).    Historical Provider, MD  sertraline (ZOLOFT) 50 MG tablet Take 1 tablet (50 mg total) by mouth daily. 08/30/15   [name], MD  Tiotropium Bromide Monohydrate (SPIRIVA RESPIMAT) 2.5 MCG/ACT AERS Inhale 2 puffs into the lungs daily. Patient taking differently: Inhale 2 puffs into the lungs daily as needed (for shortness of breath).  12/27/15   Elsie Stain, MD   Meds Ordered and Administered this Visit   Medications  ketorolac (TORADOL) injection 60 mg (not administered)    BP 163/98 (BP Location: Right Arm)   Pulse 68   Temp 98.9 F (37.2 C) (Oral)   Resp 18   LMP 02/28/2016   SpO2 98%  No data found.   Physical Exam  Constitutional: She is oriented to person, place, and time. She appears  well-developed and well-nourished. No distress.  HENT:  Head: Normocephalic and atraumatic.  Eyes: EOM are normal. Pupils are equal, round, and reactive to light.  Neck: Normal range of motion. Neck supple.  Musculoskeletal:  Tenderness along the joint line or left knee. Difficult to establish landmarks well due to body habitus. Flexion and extension is intact. Weightbearing produces excruciating pain and limits ability to ambulate.  Lymphadenopathy:    She has no cervical adenopathy.  Neurological: She is alert and oriented to person, place, and time. No cranial nerve deficit.  Skin: Skin is warm and dry.  Nursing note and vitals reviewed.   Urgent Care Course   Clinical Course  Injection of joint Date/Time: 03/16/2016 6:01 PM Performed by: Marcha Dutton, Aithana Kushner Authorized by: Melony Overly  Consent: Verbal consent obtained. Risks and benefits: risks, benefits and alternatives were discussed Consent given by: patient Patient consent: the patient's understanding of the procedure matches consent given Site marked: the operative site was marked Preparation: Patient was prepped and draped in the usual sterile fashion. Local anesthesia used: no  Anesthesia: Local anesthesia used: no  Sedation: Patient sedated: no Patient tolerance: Patient tolerated the procedure well with no immediate complications Comments: 60 mg Kenalog and 6 cc of bupivacaine utilized for injection into the joint using the medial joint space approach.    (including critical care time)  Labs Review Labs Reviewed - No data to display  Imaging Review No results found.   Visual Acuity Review  Right Eye Distance:   Left Eye Distance:   Bilateral Distance:    Right Eye Near:   Left Eye Near:    Bilateral Near:         MDM   1. Primary osteoarthritis of left knee   2. Chronic pain of left knee    The degeneration of your left knee joint is an ongoing process and will likely get worse. He is very  important that you establish with a orthopedist for additional care. If you need additional medication he may also see your primary care provider has he or she can write the same medication as we can. Meds ordered this encounter  Medications  . ketorolac (TORADOL) injection 60 mg       Janne Napoleon, NP 03/16/16 1811

## 2016-03-19 MED ORDER — OMALIZUMAB 150 MG ~~LOC~~ SOLR
225.0000 mg | Freq: Once | SUBCUTANEOUS | Status: AC
  Administered 2016-03-13: 225 mg via SUBCUTANEOUS

## 2016-03-22 ENCOUNTER — Encounter (HOSPITAL_COMMUNITY): Payer: Self-pay | Admitting: Emergency Medicine

## 2016-03-22 ENCOUNTER — Emergency Department (HOSPITAL_COMMUNITY): Payer: Medicaid Other

## 2016-03-22 ENCOUNTER — Emergency Department (HOSPITAL_COMMUNITY)
Admission: EM | Admit: 2016-03-22 | Discharge: 2016-03-22 | Disposition: A | Discharge status: Home / Self Care | Payer: Medicaid Other | Attending: Emergency Medicine | Admitting: Emergency Medicine

## 2016-03-22 DIAGNOSIS — R0602 Shortness of breath: Secondary | ICD-10-CM | POA: Diagnosis present

## 2016-03-22 DIAGNOSIS — I10 Essential (primary) hypertension: Secondary | ICD-10-CM | POA: Diagnosis not present

## 2016-03-22 DIAGNOSIS — J4541 Moderate persistent asthma with (acute) exacerbation: Secondary | ICD-10-CM | POA: Diagnosis not present

## 2016-03-22 DIAGNOSIS — J449 Chronic obstructive pulmonary disease, unspecified: Secondary | ICD-10-CM | POA: Insufficient documentation

## 2016-03-22 DIAGNOSIS — Z79899 Other long term (current) drug therapy: Secondary | ICD-10-CM | POA: Diagnosis not present

## 2016-03-22 DIAGNOSIS — Z87891 Personal history of nicotine dependence: Secondary | ICD-10-CM | POA: Insufficient documentation

## 2016-03-22 MED ORDER — CALCIUM CARBONATE ANTACID 500 MG PO CHEW: 1.0000 | CHEWABLE_TABLET | Freq: Once | ORAL | Status: DC

## 2016-03-22 MED ORDER — IPRATROPIUM-ALBUTEROL 0.5-2.5 (3) MG/3ML IN SOLN
3.0000 mL | Freq: Once | RESPIRATORY_TRACT | Status: AC
  Administered 2016-03-22: 3 mL via RESPIRATORY_TRACT

## 2016-03-22 MED ORDER — PREDNISONE 20 MG PO TABS
60.0000 mg | ORAL_TABLET | Freq: Once | ORAL | Status: AC
  Administered 2016-03-22: 60 mg via ORAL
  Filled 2016-03-22: qty 3

## 2016-03-22 MED ORDER — ALBUTEROL SULFATE (2.5 MG/3ML) 0.083% IN NEBU
INHALATION_SOLUTION | RESPIRATORY_TRACT | Status: AC
  Filled 2016-03-22: qty 3

## 2016-03-22 MED ORDER — IPRATROPIUM-ALBUTEROL 0.5-2.5 (3) MG/3ML IN SOLN
3.0000 mL | RESPIRATORY_TRACT | Status: AC
  Administered 2016-03-22: 3 mL via RESPIRATORY_TRACT
  Filled 2016-03-22: qty 3

## 2016-03-22 MED ORDER — PREDNISONE 10 MG PO TABS: 50.0000 mg | ORAL_TABLET | Freq: Every day | ORAL | 0 refills | Status: AC

## 2016-03-22 MED ORDER — ALBUTEROL SULFATE HFA 108 (90 BASE) MCG/ACT IN AERS
2.0000 | INHALATION_SPRAY | Freq: Once | RESPIRATORY_TRACT | Status: AC
  Administered 2016-03-22: 2 via RESPIRATORY_TRACT
  Filled 2016-03-22: qty 6.7

## 2016-03-22 MED ORDER — ALBUTEROL SULFATE (2.5 MG/3ML) 0.083% IN NEBU
5.0000 mg | INHALATION_SOLUTION | Freq: Once | RESPIRATORY_TRACT | Status: AC
  Administered 2016-03-22: 5 mg via RESPIRATORY_TRACT

## 2016-03-22 MED ORDER — IPRATROPIUM-ALBUTEROL 0.5-2.5 (3) MG/3ML IN SOLN
RESPIRATORY_TRACT | Status: AC
  Filled 2016-03-22: qty 3

## 2016-03-22 MED ORDER — CALCIUM CARBONATE ANTACID 500 MG PO CHEW
400.0000 mg | CHEWABLE_TABLET | Freq: Once | ORAL | Status: AC
  Administered 2016-03-22: 400 mg via ORAL
  Filled 2016-03-22: qty 2

## 2016-03-22 MED ORDER — ESOMEPRAZOLE MAGNESIUM 40 MG PO CPDR: 40.0000 mg | DELAYED_RELEASE_CAPSULE | Freq: Every day | ORAL | 0 refills | Status: DC

## 2016-03-22 NOTE — ED Notes (Signed)
Papers and prescriptions reviewed with patient and she verbalizes understanding. She expressed to follow up with the clinic to the social worker. Inhaler given. The pain patient is leaving with is in her knee and is an ongoing problem and not why she was here

## 2016-03-22 NOTE — ED Notes (Signed)
Pt asking for hhn treatment

## 2016-03-22 NOTE — ED Provider Notes (Signed)
[facility] DEPT Provider Note   CSN: [id number] Arrival date & time: 03/22/16  1232     History   Chief Complaint Chief Complaint  Patient presents with  . Asthma  . Shortness of Breath  . Wheezing    HPI April [name] is a 45 y.o. female.  The history is provided by the patient.  Wheezing   This is a new problem. Episode onset: 1 week ago intermittently worening. The problem occurs constantly. The problem has been gradually worsening. Pertinent negatives include no fever and no vomiting. Associated symptoms comments: Shortness of breath and wheezing. Precipitated by: cold weather. She has tried oral steroids (prednisone took low dose over last 4 days) for the symptoms. She has had prior ED visits. Her past medical history is significant for asthma.    Past Medical History:  Diagnosis Date  . Acanthosis nigricans   . Arthritis   . Asthma    Followed by Dr. Annamaria Boots (pulmonology); receives every other week omalizumab injections; has frequent exacerbations  . COPD (chronic obstructive pulmonary disease) ([place])    PFTs in 2002, FEV1/FVC 65, no post bronchodilater test done  . Depression   . GERD (gastroesophageal reflux disease)   . Headache(784.0)   . Helicobacter pylori (H. pylori) infection   . Hypertension   . Hypertension, essential   . Insomnia   . Menorrhagia   . Morbid obesity ([place])   . Obesity   . Seasonal allergies   . Shortness of breath   . Sleep apnea    Sleep study 2008 - mild OSA, not enough events to titrate CPAP  . Tobacco user     Patient Active Problem List   Diagnosis Date Noted  . Asthma exacerbation 11/10/2015  . GERD (gastroesophageal reflux disease) 08/30/2015  . Generalized anxiety disorder 08/30/2015  . Seasonal allergic rhinitis 08/29/2013  . Asthma, chronic obstructive, without status asthmaticus ([place]) 05/07/2012  . Knee pain, bilateral 04/25/2011  . Obstructive sleep apnea 12/19/2010  . Hypokalemia 08/13/2010  . Cervical back pain  with evidence of disc disease 04/08/2008  . Essential hypertension 07/31/2006  . Morbid obesity with body mass index of 50.0-59.9 in adult ([place]) 06/17/2006  . Major depressive disorder, recurrent episode ([place]) 04/10/2006    Past Surgical History:  Procedure Laterality Date  . BREAST REDUCTION SURGERY  09/2011  . TUBAL LIGATION  1996   bilateral    OB History    No data available       Home Medications    Prior to Admission medications   Medication Sig Start Date End Date Taking? Authorizing Provider  acetaminophen (TYLENOL) 500 MG tablet Take 2 tablets (1,000 mg total) by mouth every 6 (six) hours as needed. Patient taking differently: Take 1,000 mg by mouth every 6 (six) hours as needed for mild pain.  08/30/15   [name], MD  albuterol (PROVENTIL) (2.5 MG/3ML) 0.083% nebulizer solution Take 3 mLs (2.5 mg total) by nebulization every 4 (four) hours as needed for wheezing or shortness of breath. 12/27/15   Elsie Stain, MD  albuterol (VENTOLIN HFA) 108 (90 Base) MCG/ACT inhaler INHALE 1-2 PUFFS INTO THE LUNGS EVERY 4 HOURS as needed 01/31/16   Ledell Noss, MD  budesonide-formoterol [facility]) 160-4.5 MCG/ACT inhaler Inhale 2 puffs into the lungs 2 (two) times daily. Patient taking differently: Inhale 2 puffs into the lungs 2 (two) times daily as needed (for shortness of breath).  12/27/15 12/26/16  Elsie Stain, MD  DULERA 200-5 MCG/ACT AERO Inhale  2 puffs into the lungs 2 (two) times daily. 10/26/15   Historical Provider, MD  esomeprazole (NEXIUM) 40 MG capsule Take 40 mg by mouth daily. 08/30/15   Historical Provider, MD  fluticasone (FLONASE) 50 MCG/ACT nasal spray Place 2 sprays into both nostrils daily. 12/27/15   Elsie Stain, MD  guaiFENesin (MUCINEX) 600 MG 12 hr tablet Take 1 tablet (600 mg total) by mouth 2 (two) times daily as needed. 12/21/15   [name], MD  hydrochlorothiazide (HYDRODIURIL) 25 MG tablet Take 1 tablet (25 mg total) by mouth daily. 12/27/15    Elsie Stain, MD  HYDROcodone-acetaminophen (NORCO) 5-325 MG tablet Take 1-2 tablets by mouth every 4 (four) hours as needed. 03/13/16   Sherwood Gambler, MD  ibuprofen (ADVIL,MOTRIN) 200 MG tablet Take 600 mg by mouth 2 (two) times daily as needed for moderate pain (knee pain).     Historical Provider, MD  ipratropium (ATROVENT) 0.02 % nebulizer solution Take 2.5 mLs (0.5 mg total) by nebulization 4 (four) times daily as needed for wheezing or shortness of breath. 12/27/15   Elsie Stain, MD  loratadine (CLARITIN) 10 MG tablet Take 1 tablet (10 mg total) by mouth daily. 08/30/15   [name], MD  naproxen (NAPROSYN) 375 MG tablet Take 1 tablet (375 mg total) by mouth 2 (two) times daily. 03/11/16   Janne Napoleon, NP  nicotine (NICODERM CQ - DOSED IN MG/24 HOURS) 14 mg/24hr patch Place 14 mg onto the skin 3 (three) times a week.    Historical Provider, MD  predniSONE (DELTASONE) 20 MG tablet Take 2 tablets (40 mg total) by mouth daily. 02/28/16   Tatyana Kirichenko, PA-C  Pseudoeph-Doxylamine-DM-APAP (NYQUIL PO) Take 15 mLs by mouth as needed (for cough).    Historical Provider, MD  sertraline (ZOLOFT) 50 MG tablet Take 1 tablet (50 mg total) by mouth daily. 08/30/15   [name], MD  Tiotropium Bromide Monohydrate (SPIRIVA RESPIMAT) 2.5 MCG/ACT AERS Inhale 2 puffs into the lungs daily. Patient taking differently: Inhale 2 puffs into the lungs daily as needed (for shortness of breath).  12/27/15   Elsie Stain, MD    Family History Family History  Problem Relation Age of Onset  . Hypertension Mother   . Asthma Daughter   . Cancer Paternal Aunt   . Asthma Maternal Grandmother     Social History Social History  Substance Use Topics  . Smoking status: Former Smoker    Years: 18.00    Types: Cigarettes    Quit date: 09/15/2014  . Smokeless tobacco: Never Used  . Alcohol use No     Allergies   Patient has no known allergies.   Review of Systems Review of Systems  Constitutional:  Negative for fever.  Respiratory: Positive for wheezing.   Gastrointestinal: Negative for vomiting.       Reflux  All other systems reviewed and are negative.    Physical Exam Updated Vital Signs BP (!) 178/104   Pulse 99   Temp 98.1 F (36.7 C) (Oral)   Resp 24   LMP 03/22/2016   SpO2 99%   Physical Exam  Constitutional: She is oriented to person, place, and time. She appears well-developed and well-nourished. No distress.  HENT:  Head: Normocephalic.  Nose: Nose normal.  Eyes: Conjunctivae are normal.  Neck: Neck supple. No tracheal deviation present.  Cardiovascular: Normal rate, regular rhythm and normal heart sounds.   Pulmonary/Chest: Effort normal. No respiratory distress. She has wheezes (faint bilateral end  expiratory).  Abdominal: Soft. She exhibits no distension.  Neurological: She is alert and oriented to person, place, and time.  Skin: Skin is warm and dry.  Psychiatric: She has a normal mood and affect.  Vitals reviewed.    ED Treatments / Results  Labs (all labs ordered are listed, but only abnormal results are displayed) Labs Reviewed - No data to display  EKG  EKG Interpretation  Date/Time:  Friday March 22 2016 12:36:27 EST Ventricular Rate:  106 PR Interval:  126 QRS Duration: 88 QT Interval:  364 QTC Calculation: 483 R Axis:   55 Text Interpretation:  Sinus tachycardia Right atrial enlargement Nonspecific ST abnormality No significant change since last tracing Confirmed by Marquarius Lofton MD, Amaka Gluth [phone]) on 03/22/2016 5:04:05 PM       Radiology Dg Chest 2 View  Result Date: 03/22/2016 CLINICAL DATA:  Recurrent asthma exacerbation. EXAM: CHEST  2 VIEW COMPARISON:  Chest x-rays dated 02/28/2016, 12/18/2015 and 11/26/2015. FINDINGS: Cardiomediastinal silhouette is normal in size and configuration. Lungs are clear. Lung volumes are normal. No evidence of pneumonia. No pleural effusion. No pneumothorax. Osseous and soft tissue structures about the  chest are unremarkable. IMPRESSION: No active cardiopulmonary disease. No evidence of pneumonia or pulmonary edema. Electronically Signed   By: Franki Cabot M.D.   On: 03/22/2016 18:05    Procedures Procedures (including critical care time)  Medications Ordered in ED Medications  ipratropium-albuterol (DUONEB) 0.5-2.5 (3) MG/3ML nebulizer solution 3 mL (3 mLs Nebulization Given 03/22/16 1245)  albuterol (PROVENTIL) (2.5 MG/3ML) 0.083% nebulizer solution 5 mg (5 mg Nebulization Given 03/22/16 1634)     Initial Impression / Assessment and Plan / ED Course  I have reviewed the triage vital signs and the nursing notes.  Pertinent labs & imaging results that were available during my care of the patient were reviewed by me and considered in my medical decision making (see chart for details).  Clinical Course     45 year old female with history of morbid obesity, asthma, GERD and degenerative joint disease on the left knee presents with worsening typical asthma exacerbation over the last 2 days. She states that she has had symptoms of asthma exacerbation for the last week and started taking 40 mg of prednisone 5 days ago and tapered off by 10 mg per day over the last 4 days but it did not seem to improve. She does have some bilateral wheezing and appears short of breath on exam. She was given 3 DuoNeb treatments to optimize ipratropium therapy and provided a suppressive dose of prednisone. The  She has a secondary complaint of GERD getting worse over the last week as well and I explained that the vast majority of her symptoms including her joint pain, reflux, and asthma are exacerbated by her weight and she needed to concentrate on diet, exercise and aggressive weight loss to see any improvement. I informed her that if she does not achieve a healthier weight at some point she can expect to have worsening of all these conditions for much of the rest of her life despite optimal medical  therapy.  Final Clinical Impressions(s) / ED Diagnoses   Final diagnoses:  Moderate persistent asthma with exacerbation    New Prescriptions New Prescriptions   PREDNISONE (DELTASONE) 10 MG TABLET    Take 5 tablets (50 mg total) by mouth daily.     Leo Grosser, MD 03/22/16 [phone]

## 2016-03-22 NOTE — ED Triage Notes (Signed)
Pt requesting breathing treatment, states she feels like she is wheezing and the last treatment has worn off. Albuterol ordered and vital signs rechecked

## 2016-03-22 NOTE — ED Triage Notes (Signed)
Pt. Stated, I've had asthma x 3 days and my inhaler is not working.

## 2016-03-22 NOTE — ED Notes (Signed)
Pt up to desk for the 2nd time in one hour asking for wait time

## 2016-03-25 IMAGING — DX DG CHEST 2V
2 series · 2 of 2 positions shown · non-contrast
Comparison: PA and lateral chest 02/08/2015 and 03/27/2014.

CLINICAL DATA: Cough for 1 week, worsened today.

EXAM:
CHEST  2 VIEW

[chest pa]
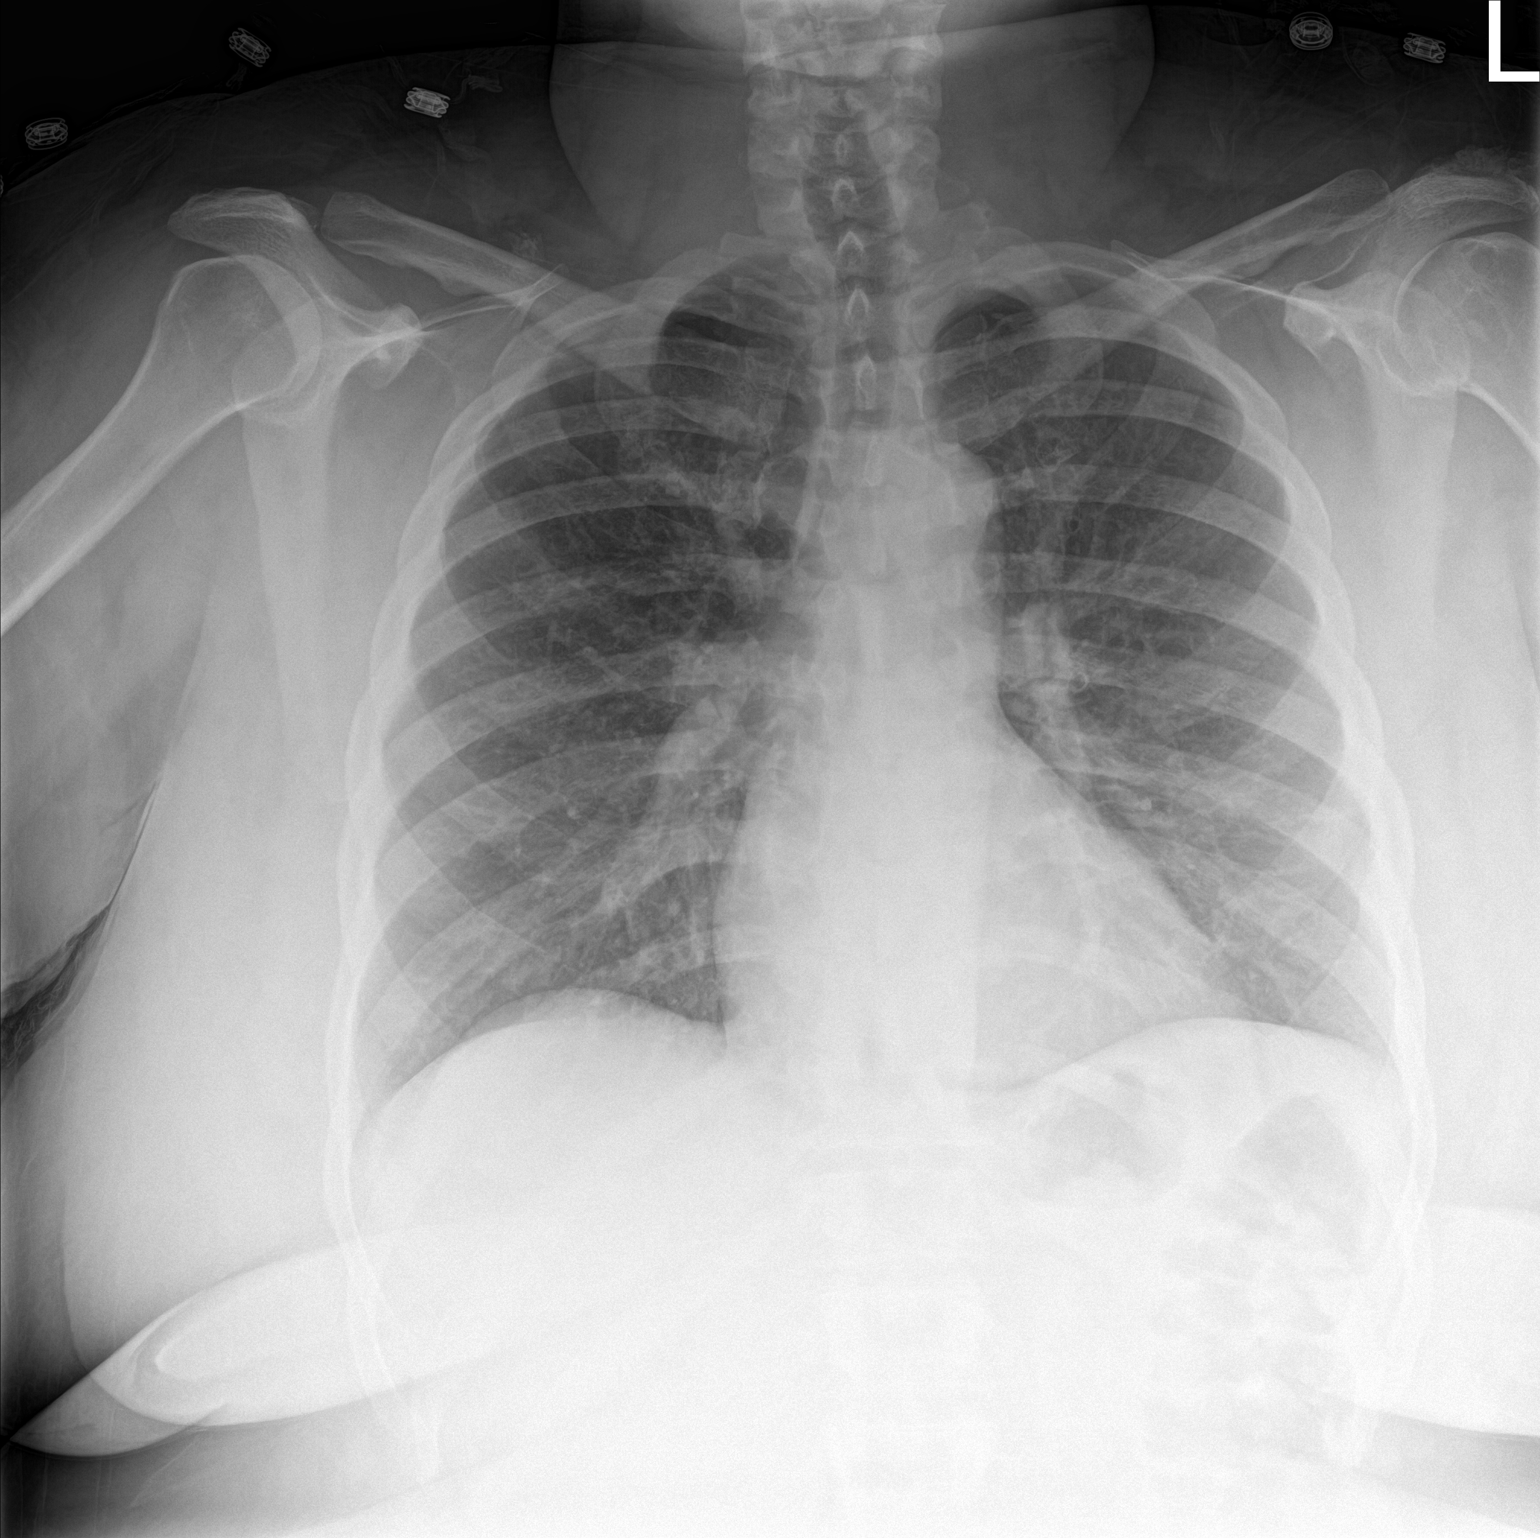

[chest lat]
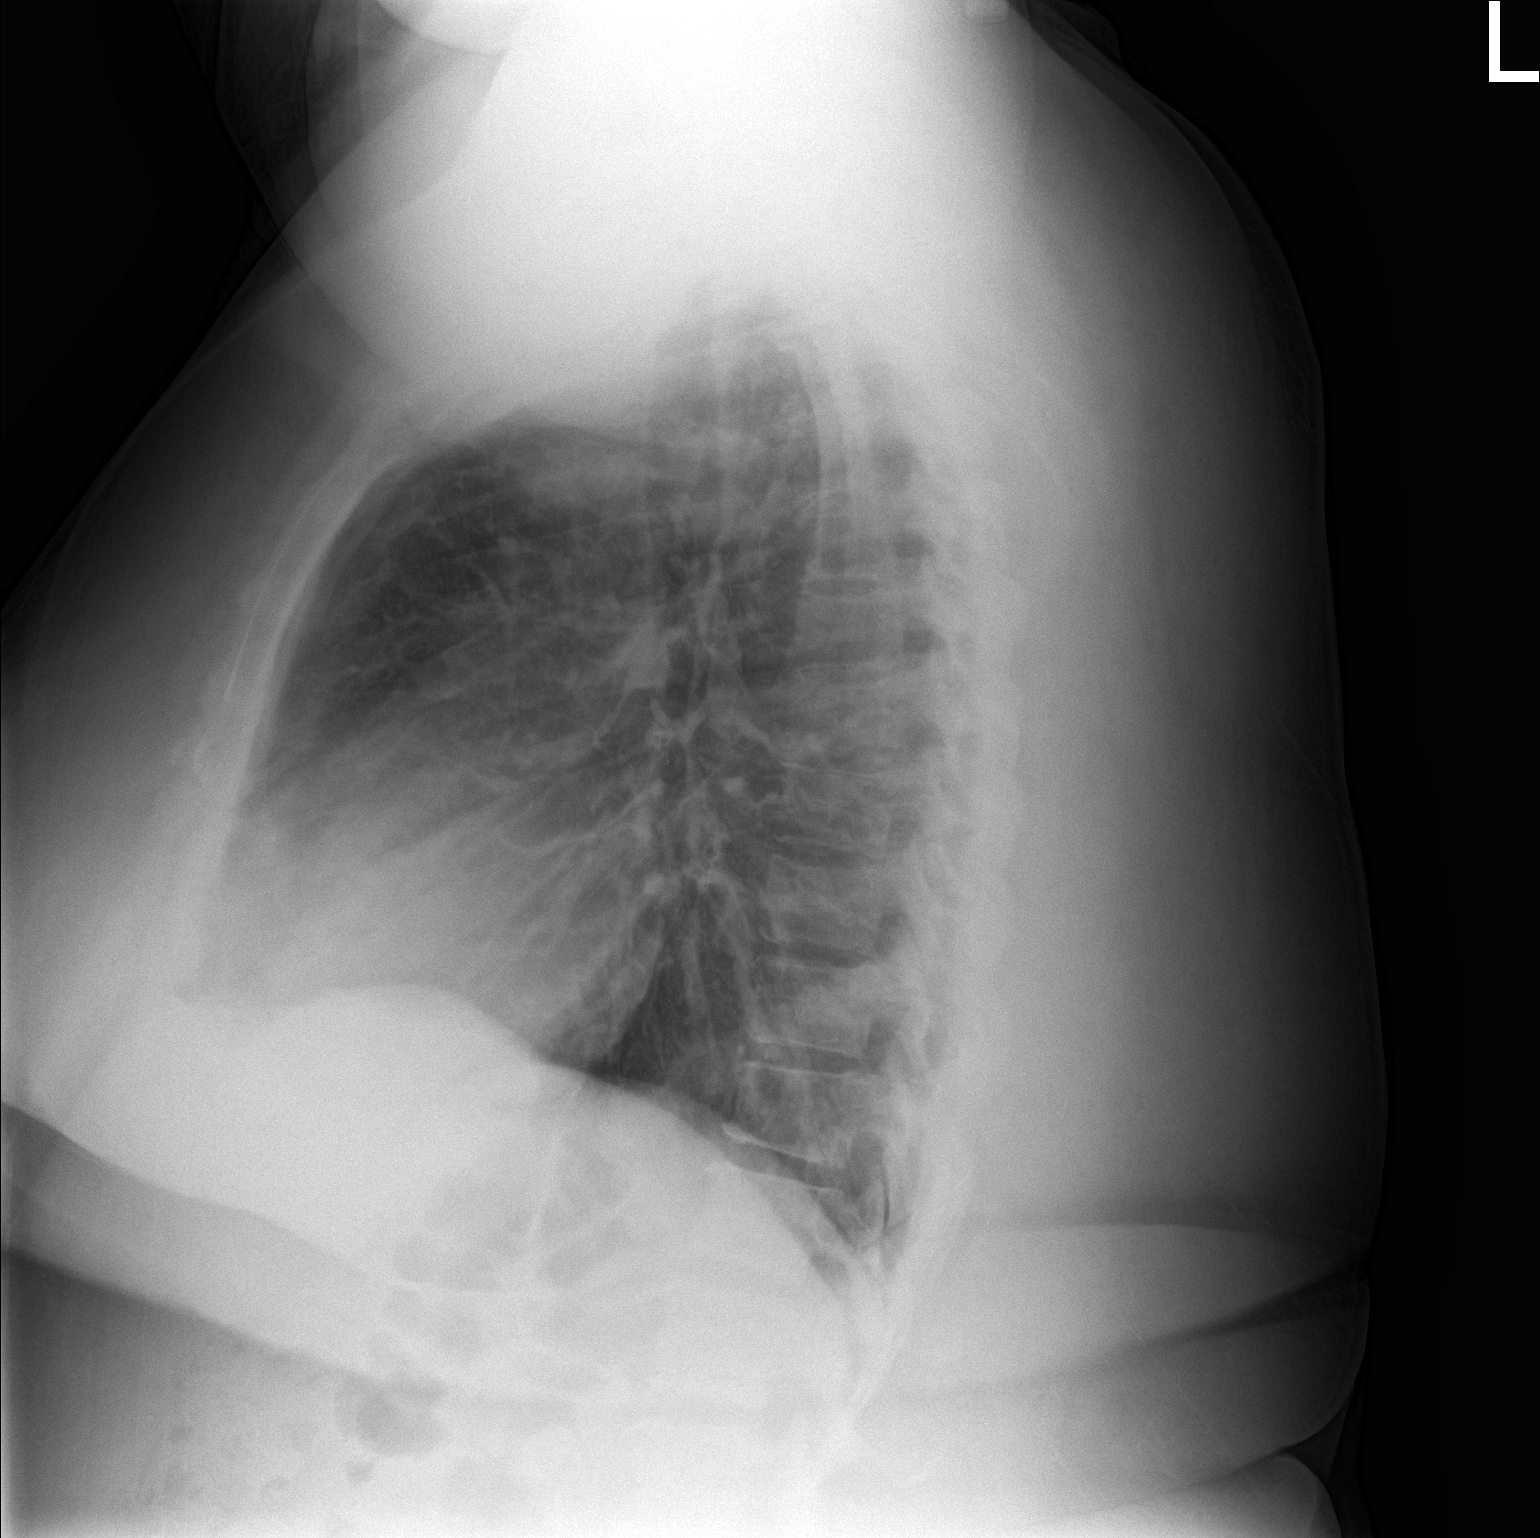

[2 of 2 positions shown; findings below may reference images not displayed]

FINDINGS: The lungs are clear. Heart size is normal. No pneumothorax or
pleural effusion. No focal bony abnormality.
IMPRESSION: Negative chest.

## 2016-03-25 MED FILL — ALBUTEROL 0.083% INHAL SOLN: (2.5 MG/3ML | 10 days supply | Qty: 180 | Fill #2

## 2016-03-25 MED FILL — VENTOLIN HFA 90 MCG INHALER: 108 (90 BAS | 30 days supply | Qty: 18 | Fill #3

## 2016-03-25 MED FILL — HYDROCHLOROTHIAZIDE 25 MG T: 25 | 30 days supply | Qty: 30 | Fill #1

## 2016-03-26 ENCOUNTER — Telehealth: Payer: Self-pay

## 2016-03-26 NOTE — Telephone Encounter (Addendum)
Patient upset due to schedule availability and needs hospital follow up for Asthma. Rn recommended patient contact [facility] to schedule follow up re: Asthma. Patient agrees since she has allergy appointment tomorrow.  Suggested she contact them today.  RN apologized due to limited schedule availability (walk-in) and offered the following alternatives: [facility] (patient Declined), ED/Urgent Care if something occurs & she needs to be seen sooner.    Patient states she needs to be seen for other issues at [facility] for example: BP, Baker's Cyst.  Return call to patient and advised to arrive at 2 pm tomorrow and she may have to wait but were going work her in.  Appointment scheduled with [facility].

## 2016-03-27 ENCOUNTER — Encounter: Payer: Self-pay | Admitting: Physician Assistant

## 2016-03-27 ENCOUNTER — Ambulatory Visit (INDEPENDENT_AMBULATORY_CARE_PROVIDER_SITE_OTHER): Payer: Self-pay

## 2016-03-27 ENCOUNTER — Ambulatory Visit: Payer: Medicaid Other | Attending: Internal Medicine | Admitting: Physician Assistant

## 2016-03-27 VITALS — BP 146/83 | HR 104 | Temp 98.3°F | Wt 352.0 lb

## 2016-03-27 DIAGNOSIS — K219 Gastro-esophageal reflux disease without esophagitis: Secondary | ICD-10-CM | POA: Diagnosis not present

## 2016-03-27 DIAGNOSIS — M25562 Pain in left knee: Secondary | ICD-10-CM

## 2016-03-27 DIAGNOSIS — J4540 Moderate persistent asthma, uncomplicated: Secondary | ICD-10-CM | POA: Diagnosis not present

## 2016-03-27 DIAGNOSIS — M7122 Synovial cyst of popliteal space [Baker], left knee: Secondary | ICD-10-CM | POA: Diagnosis not present

## 2016-03-27 DIAGNOSIS — I10 Essential (primary) hypertension: Secondary | ICD-10-CM

## 2016-03-27 DIAGNOSIS — G8929 Other chronic pain: Secondary | ICD-10-CM

## 2016-03-27 MED ORDER — LISINOPRIL 10 MG PO TABS: 10.0000 mg | ORAL_TABLET | Freq: Every day | ORAL | 3 refills | Status: DC

## 2016-03-27 MED ORDER — TRAMADOL HCL 50 MG PO TABS: 50.0000 mg | ORAL_TABLET | Freq: Three times a day (TID) | ORAL | 0 refills | Status: DC | PRN

## 2016-03-27 NOTE — Progress Notes (Signed)
Chief Complaint: ED follow up/"my BP has been up"  Subjective: 45 yo female with HTN, GERD, moderate persistent asthma, and DJD has been in the ED at least 5 times in last month; briefly: 1. 11/15-left knee pain. Xray negative for fx +baker's cyst  -treated with Toradol and NSAIDS (BP 168/118) 2. 11/18-continued knee pain  -more Toradol (BP 163/98) 3. 11/24-c/o wheezing and SHOB Dx-asthma exac  -followed bu [facility]  -steroids/nebs with improvement (BP 178/104) Today following up. No SHOB. Not wheezing. No CP. Is bothered by the left knee pain. Concerned about elevations in BP. Take s HCTZ and claims compliance.    ROS:  GEN: denies fever or chills, denies change in weight Skin: denies lesions or rashes HEENT: denies headache, earache, epistaxis, sore throat, or neck pain LUNGS: denies SHOB, dyspnea, PND, orthopnea CV: denies CP or palpitations ABD: denies abd pain, N or V EXT: denies muscle spasms or swelling; + pain in lower ext on left especially, no weakness NEURO: denies numbness or tingling, denies sz, stroke or TIA   Objective:  Vitals:   03/27/16 1434  BP: (!) 146/83  Pulse: (!) 104  Temp: 98.3 F (36.8 C)  TempSrc: Oral  SpO2: 97%  Weight: (!) 352 lb (159.7 kg)    Physical Exam:  General: in no acute distress. Morbidly obese.  HEENT: no pallor, no icterus, moist oral mucosa, no JVD, no lymphadenopathy Heart: Normal  s1 &s2  Regular rate and rhythm, without murmurs, rubs, gallops. Lungs: Clear to auscultation bilaterally. Abdomen: Soft, nontender, nondistended, positive bowel sounds. Extremities: No clubbing cyanosis or edema with positive pedal pulses. Neuro: Alert, awake, oriented x3, nonfocal.   Medications: Prior to Admission medications   Medication Sig Start Date End Date Taking? Authorizing Provider  acetaminophen (TYLENOL) 500 MG tablet Take 2 tablets (1,000 mg total) by mouth every 6 (six) hours as needed. Patient taking differently: Take  1,000 mg by mouth every 6 (six) hours as needed for mild pain.  08/30/15  Yes [name], MD  albuterol (PROVENTIL) (2.5 MG/3ML) 0.083% nebulizer solution Take 3 mLs (2.5 mg total) by nebulization every 4 (four) hours as needed for wheezing or shortness of breath. 12/27/15  Yes Elsie Stain, MD  albuterol (VENTOLIN HFA) 108 (90 Base) MCG/ACT inhaler INHALE 1-2 PUFFS INTO THE LUNGS EVERY 4 HOURS as needed 01/31/16  Yes Ledell Noss, MD  budesonide-formoterol [facility]) 160-4.5 MCG/ACT inhaler Inhale 2 puffs into the lungs 2 (two) times daily. Patient taking differently: Inhale 2 puffs into the lungs 2 (two) times daily as needed (for shortness of breath).  12/27/15 12/26/16 Yes Elsie Stain, MD  DULERA 200-5 MCG/ACT AERO Inhale 2 puffs into the lungs 2 (two) times daily. 10/26/15  Yes Historical Provider, MD  esomeprazole (NEXIUM) 40 MG capsule Take 1 capsule (40 mg total) by mouth daily. 03/22/16  Yes Leo Grosser, MD  fluticasone (FLONASE) 50 MCG/ACT nasal spray Place 2 sprays into both nostrils daily. 12/27/15  Yes Elsie Stain, MD  hydrochlorothiazide (HYDRODIURIL) 25 MG tablet Take 1 tablet (25 mg total) by mouth daily. 12/27/15  Yes Elsie Stain, MD  loratadine (CLARITIN) 10 MG tablet Take 1 tablet (10 mg total) by mouth daily. 08/30/15  Yes [name], MD  guaiFENesin (MUCINEX) 600 MG 12 hr tablet Take 1 tablet (600 mg total) by mouth 2 (two) times daily as needed. Patient not taking: Reported on 03/27/2016 12/21/15   [name], MD  HYDROcodone-acetaminophen [facility]) 5-325 MG tablet Take 1-2  tablets by mouth every 4 (four) hours as needed. Patient not taking: Reported on 03/27/2016 03/13/16   Sherwood Gambler, MD  ibuprofen (ADVIL,MOTRIN) 200 MG tablet Take 600 mg by mouth 2 (two) times daily as needed for moderate pain (knee pain).     Historical Provider, MD  ipratropium (ATROVENT) 0.02 % nebulizer solution Take 2.5 mLs (0.5 mg total) by nebulization 4 (four) times daily as needed for  wheezing or shortness of breath. Patient not taking: Reported on 03/27/2016 12/27/15   Elsie Stain, MD  lisinopril (PRINIVIL,ZESTRIL) 10 MG tablet Take 1 tablet (10 mg total) by mouth daily. 03/27/16   Giuliano Preece Daneil Dan, PA-C  naproxen (NAPROSYN) 375 MG tablet Take 1 tablet (375 mg total) by mouth 2 (two) times daily. Patient not taking: Reported on 03/27/2016 03/11/16   Janne Napoleon, NP  nicotine (NICODERM CQ - DOSED IN MG/24 HOURS) 14 mg/24hr patch Place 14 mg onto the skin 3 (three) times a week.    Historical Provider, MD  predniSONE (DELTASONE) 10 MG tablet Take 5 tablets (50 mg total) by mouth daily. Patient not taking: Reported on 03/27/2016 03/23/16 03/27/16  Leo Grosser, MD  Pseudoeph-Doxylamine-DM-APAP (NYQUIL PO) Take 15 mLs by mouth as needed (for cough).    Historical Provider, MD  sertraline (ZOLOFT) 50 MG tablet Take 1 tablet (50 mg total) by mouth daily. Patient not taking: Reported on 03/27/2016 08/30/15   [name], MD  Tiotropium Bromide Monohydrate (SPIRIVA RESPIMAT) 2.5 MCG/ACT AERS Inhale 2 puffs into the lungs daily. Patient not taking: Reported on 03/27/2016 12/27/15   Elsie Stain, MD    Assessment: 1. HTN-not controlled 2. Moderate ersistent asthma 3. Left Baker's cyst  Plan: Added ACE for BP (considred BB with inc HR but didn't want to bother her asthma) Cont regular f/u with [facility] Cont current asthma regimen Ortho referral Prn pain meds Weight loss/RF modification encouraged  Follow up:2 weeks  The patient was given clear instructions to go to ER or return to medical center if symptoms don't improve, worsen or new problems develop. The patient verbalized understanding. The patient was told to call to get lab results if they haven't heard anything in the next week.   This note has been created with Surveyor, quantity. Any transcriptional errors are unintentional.   Zettie Pho, PA-C 03/27/2016, 2:58  PM

## 2016-03-28 ENCOUNTER — Other Ambulatory Visit: Payer: Self-pay | Admitting: *Deleted

## 2016-03-28 IMAGING — CR DG CHEST 1V PORT
1 series · 1 of 1 positions shown · non-contrast
Comparison: Chest radiograph March 10, 2015

CLINICAL DATA: Shortness of breath, history of COPD, morbid
obesity.

EXAM:
PORTABLE CHEST 1 VIEW

[AP]
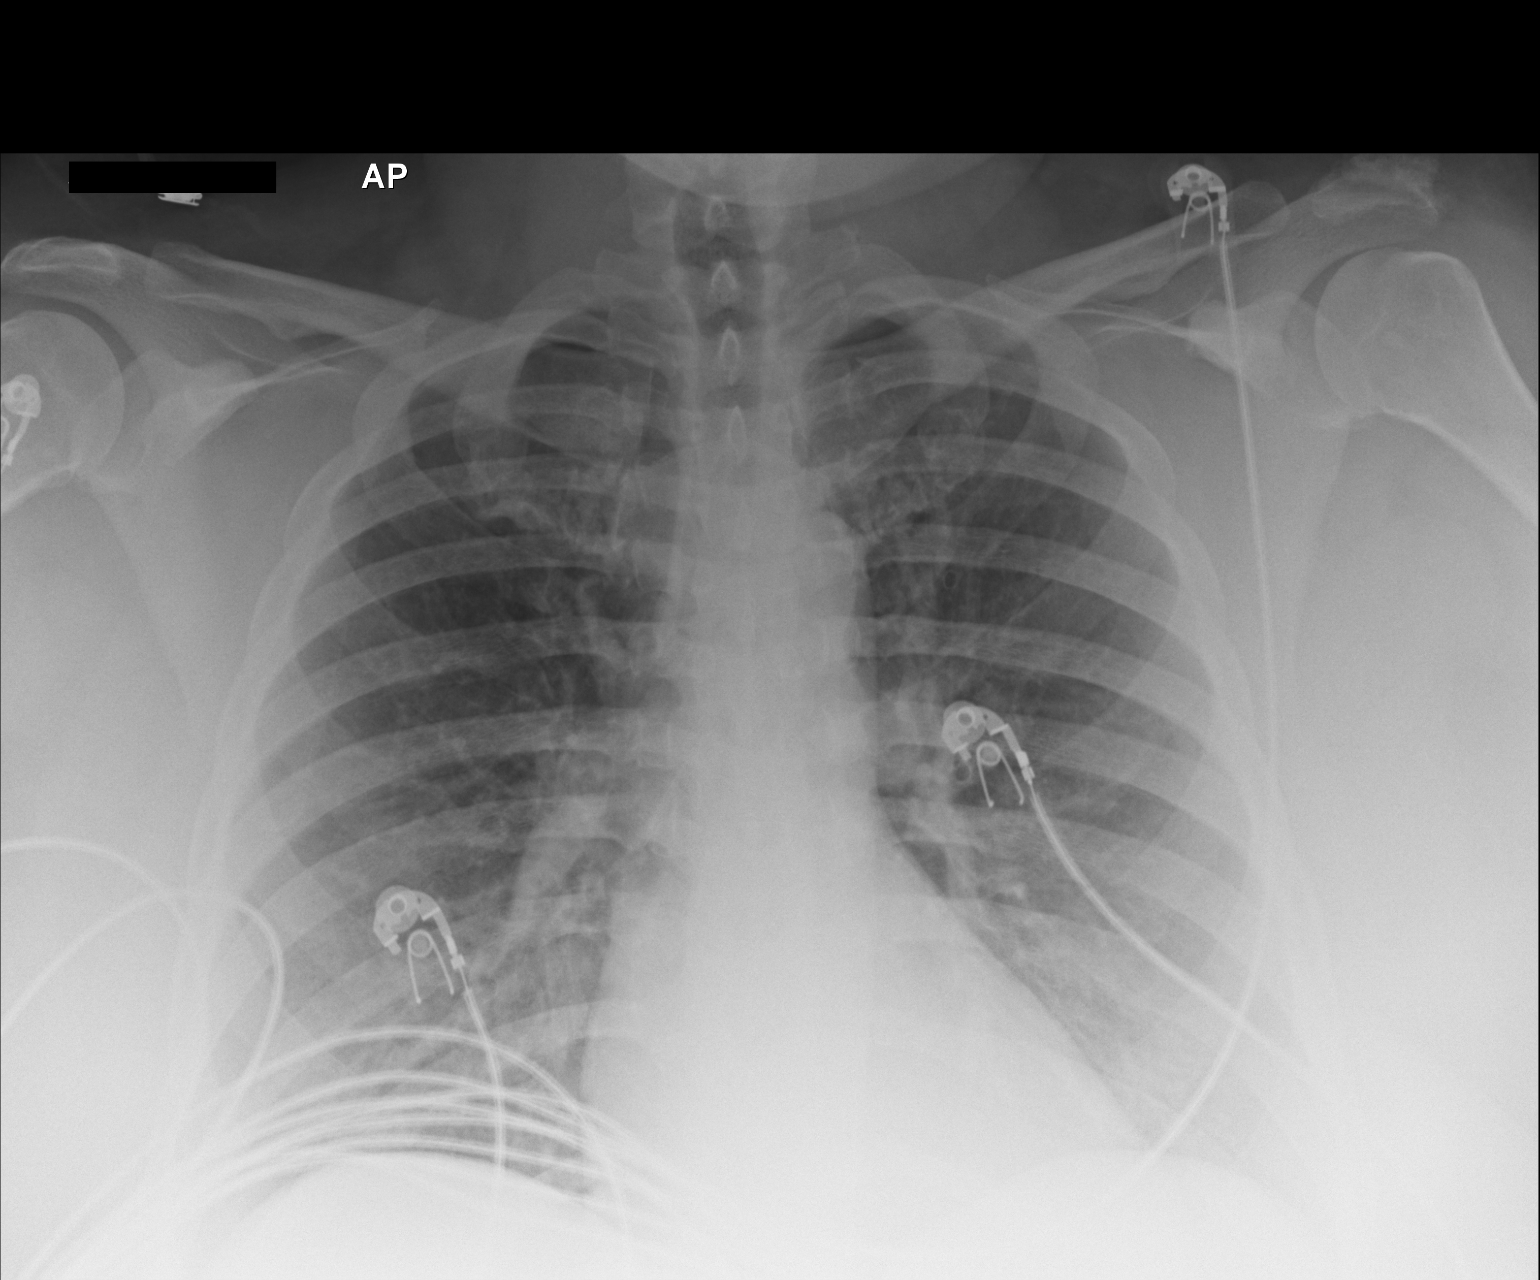

[1 of 1 positions shown; findings below may reference images not displayed]

FINDINGS: Cardiomediastinal silhouette is normal. The lungs are clear without
pleural effusions or focal consolidations. Trachea projects midline
and there is no pneumothorax. Soft tissue planes and included
osseous structures are non-suspicious. Spurring along the superior
margin of the acromion, or artifact related to overlying marker.
Large body habitus.
IMPRESSION: No acute cardiopulmonary process.

## 2016-03-28 MED ORDER — ALBUTEROL SULFATE HFA 108 (90 BASE) MCG/ACT IN AERS: INHALATION_SPRAY | RESPIRATORY_TRACT | 3 refills | Status: DC

## 2016-03-28 MED ORDER — OMALIZUMAB 150 MG ~~LOC~~ SOLR
225.0000 mg | SUBCUTANEOUS | Status: DC
  Administered 2016-03-27: 225 mg via SUBCUTANEOUS

## 2016-03-28 MED ORDER — BUDESONIDE-FORMOTEROL FUMARATE 160-4.5 MCG/ACT IN AERO: 2.0000 | INHALATION_SPRAY | Freq: Two times a day (BID) | RESPIRATORY_TRACT | 3 refills | Status: DC

## 2016-04-05 ENCOUNTER — Telehealth: Payer: Self-pay | Admitting: *Deleted

## 2016-04-05 NOTE — Telephone Encounter (Signed)
My bad I thought I created a note yesterday. I called pt. And told her I had spoken with the pharmacy. They said they couldn't process the order until the SMN and Financial forms were filled out. She had said she was coming by today to do so, haven't seen her yet.

## 2016-04-09 NOTE — Telephone Encounter (Signed)
Called pt. Back had to leave a message on machine. I understand her not coming Fri. (bad weather), the sooner she comes in the better. After she fills out the financial form we can fax the form and get the process started. Waiting to hear from pt.

## 2016-04-10 ENCOUNTER — Ambulatory Visit (INDEPENDENT_AMBULATORY_CARE_PROVIDER_SITE_OTHER): Payer: Self-pay | Admitting: Orthopedic Surgery

## 2016-04-12 NOTE — Telephone Encounter (Signed)
Pt. Came in and signed papers today. Joellen Jersey was covering for lunch. She'll finish all the paperwork and fax them to [facility].

## 2016-04-16 MED FILL — !VENTOLIN HFA INHALER: 108 (90 BAS | 30 days supply | Qty: 18 | Fill #4

## 2016-04-16 MED FILL — ALBUTEROL 0.083% INHAL SOLN: (2.5 MG/3ML | 10 days supply | Qty: 180 | Fill #3

## 2016-04-18 ENCOUNTER — Other Ambulatory Visit: Payer: Self-pay | Admitting: *Deleted

## 2016-04-18 MED ORDER — BUDESONIDE-FORMOTEROL FUMARATE 160-4.5 MCG/ACT IN AERO: 2.0000 | INHALATION_SPRAY | Freq: Two times a day (BID) | RESPIRATORY_TRACT | 3 refills | Status: DC

## 2016-04-18 NOTE — Telephone Encounter (Signed)
PRINTED FOR PASS PROGRAM 

## 2016-04-21 ENCOUNTER — Other Ambulatory Visit: Payer: Self-pay | Admitting: Internal Medicine

## 2016-04-30 ENCOUNTER — Emergency Department (HOSPITAL_COMMUNITY)
Admission: EM | Admit: 2016-04-30 | Discharge: 2016-04-30 | Disposition: A | Discharge status: Home / Self Care | Payer: Medicaid Other | Attending: Emergency Medicine | Admitting: Emergency Medicine

## 2016-04-30 ENCOUNTER — Emergency Department (HOSPITAL_COMMUNITY): Payer: Medicaid Other

## 2016-04-30 ENCOUNTER — Encounter (HOSPITAL_COMMUNITY): Payer: Self-pay | Admitting: *Deleted

## 2016-04-30 DIAGNOSIS — J4541 Moderate persistent asthma with (acute) exacerbation: Secondary | ICD-10-CM | POA: Insufficient documentation

## 2016-04-30 DIAGNOSIS — Z87891 Personal history of nicotine dependence: Secondary | ICD-10-CM | POA: Diagnosis not present

## 2016-04-30 DIAGNOSIS — J449 Chronic obstructive pulmonary disease, unspecified: Secondary | ICD-10-CM | POA: Insufficient documentation

## 2016-04-30 DIAGNOSIS — M25562 Pain in left knee: Secondary | ICD-10-CM | POA: Diagnosis not present

## 2016-04-30 DIAGNOSIS — Z79899 Other long term (current) drug therapy: Secondary | ICD-10-CM | POA: Insufficient documentation

## 2016-04-30 DIAGNOSIS — I10 Essential (primary) hypertension: Secondary | ICD-10-CM | POA: Insufficient documentation

## 2016-04-30 DIAGNOSIS — R0602 Shortness of breath: Secondary | ICD-10-CM | POA: Diagnosis present

## 2016-04-30 LAB — BASIC METABOLIC PANEL
Anion gap: 7 (ref 5–15)
BUN: <5 mg/dL — ABNORMAL LOW (ref 6–20)
CALCIUM: 8.4 mg/dL — AB (ref 8.9–10.3)
CO2: 28 mmol/L (ref 22–32)
CREATININE: 0.72 mg/dL (ref 0.44–1.00)
Chloride: 103 mmol/L (ref 101–111)
GFR calc Af Amer: >60 mL/min (ref >60)
GFR calc non Af Amer: >60 mL/min (ref >60)
GLUCOSE: 111 mg/dL — AB (ref 65–99)
Potassium: 2.8 mmol/L — ABNORMAL LOW (ref 3.5–5.1)
Sodium: 138 mmol/L (ref 135–145)

## 2016-04-30 LAB — CBC
HCT: 40.2 % (ref 36.0–46.0)
HEMOGLOBIN: 12.4 g/dL (ref 12.0–15.0)
MCH: 26.5 pg (ref 26.0–34.0)
MCHC: 30.8 g/dL (ref 30.0–36.0)
MCV: 85.9 fL (ref 78.0–100.0)
PLATELETS: 413 10*3/uL — AB (ref 150–400)
RBC: 4.68 MIL/uL (ref 3.87–5.11)
RDW: 17.1 % — ABNORMAL HIGH (ref 11.5–15.5)
WBC: 16.4 10*3/uL — ABNORMAL HIGH (ref 4.0–10.5)

## 2016-04-30 LAB — I-STAT TROPONIN, ED: TROPONIN I, POC: 0 ng/mL (ref 0.00–0.08)

## 2016-04-30 MED ORDER — ALBUTEROL (5 MG/ML) CONTINUOUS INHALATION SOLN
10.0000 mg/h | INHALATION_SOLUTION | Freq: Once | RESPIRATORY_TRACT | Status: AC
  Administered 2016-04-30: 10 mg/h via RESPIRATORY_TRACT

## 2016-04-30 MED ORDER — PREDNISONE 10 MG PO TABS: 20.0000 mg | ORAL_TABLET | Freq: Two times a day (BID) | ORAL | 0 refills | Status: DC

## 2016-04-30 MED ORDER — ALBUTEROL SULFATE HFA 108 (90 BASE) MCG/ACT IN AERS
2.0000 | INHALATION_SPRAY | RESPIRATORY_TRACT | Status: DC | PRN
  Administered 2016-04-30: 2 via RESPIRATORY_TRACT
  Filled 2016-04-30: qty 6.7

## 2016-04-30 MED ORDER — PREDNISONE 20 MG PO TABS
40.0000 mg | ORAL_TABLET | Freq: Once | ORAL | Status: AC
  Administered 2016-04-30: 40 mg via ORAL
  Filled 2016-04-30: qty 2

## 2016-04-30 MED ORDER — ALBUTEROL (5 MG/ML) CONTINUOUS INHALATION SOLN
5.0000 mg/h | INHALATION_SOLUTION | Freq: Once | RESPIRATORY_TRACT | Status: DC
  Filled 2016-04-30: qty 20

## 2016-04-30 MED ORDER — IPRATROPIUM-ALBUTEROL 0.5-2.5 (3) MG/3ML IN SOLN
3.0000 mL | Freq: Once | RESPIRATORY_TRACT | Status: AC
  Administered 2016-04-30: 3 mL via RESPIRATORY_TRACT
  Filled 2016-04-30: qty 3

## 2016-04-30 MED ORDER — ALBUTEROL SULFATE (2.5 MG/3ML) 0.083% IN NEBU
5.0000 mg | INHALATION_SOLUTION | Freq: Once | RESPIRATORY_TRACT | Status: AC
Start: 2016-04-30 — End: 2016-04-30
  Administered 2016-04-30: 5 mg via RESPIRATORY_TRACT

## 2016-04-30 MED ORDER — ALBUTEROL SULFATE (2.5 MG/3ML) 0.083% IN NEBU
INHALATION_SOLUTION | RESPIRATORY_TRACT | Status: AC
  Filled 2016-04-30: qty 6

## 2016-04-30 NOTE — ED Provider Notes (Signed)
[facility] DEPT Provider Note   CSN: [id number] Arrival date & time: 04/30/16  [id number]  By signing my name below, I, Judithe Modest, attest that this documentation has been prepared under the direction and in the presence of Ludwig Clarks, MD. Electronically Signed: Judithe Modest, ER Scribe. 12/09/2015. 5:41 PM.  History   Chief Complaint Chief Complaint  Patient presents with  . Asthma  . Knee Pain  . Chest Pain   The history is provided by the patient. No language interpreter was used.  Asthma  Associated symptoms include chest pain and shortness of breath.  Knee Pain    Chest Pain   Associated symptoms include cough and shortness of breath. Pertinent negatives include no fever.   HPI Comments: April [name] is a 46 y.o. female who presents to the Emergency Department complaining of SOB and CP with deep breathing. April [name] endorses associated wheezing. April [name] has not gotten her allergy shot in four weeks and her asthma has gotten progressively worse over that time. April [name] is also complaining of worsening left knee pain for the past month. April [name] denies having a fall. April [name] has been told April [name] has a cyst in her knee.    Past Medical History:  Diagnosis Date  . Acanthosis nigricans   . Arthritis   . Asthma    Followed by Dr. Annamaria Boots (pulmonology); receives every other week omalizumab injections; has frequent exacerbations  . COPD (chronic obstructive pulmonary disease) ([place])    PFTs in 2002, FEV1/FVC 65, no post bronchodilater test done  . Depression   . GERD (gastroesophageal reflux disease)   . Headache(784.0)   . Helicobacter pylori (H. pylori) infection   . Hypertension   . Hypertension, essential   . Insomnia   . Menorrhagia   . Morbid obesity ([place])   . Obesity   . Seasonal allergies   . Shortness of breath   . Sleep apnea    Sleep study 2008 - mild OSA, not enough events to titrate CPAP  . Tobacco user     Patient Active Problem List   Diagnosis Date Noted  . Asthma  exacerbation 11/10/2015  . GERD (gastroesophageal reflux disease) 08/30/2015  . Generalized anxiety disorder 08/30/2015  . Seasonal allergic rhinitis 08/29/2013  . Asthma, chronic obstructive, without status asthmaticus ([place]) 05/07/2012  . Knee pain, bilateral 04/25/2011  . Obstructive sleep apnea 12/19/2010  . Hypokalemia 08/13/2010  . Cervical back pain with evidence of disc disease 04/08/2008  . Essential hypertension 07/31/2006  . Morbid obesity with body mass index of 50.0-59.9 in adult ([place]) 06/17/2006  . Major depressive disorder, recurrent episode ([place]) 04/10/2006    Past Surgical History:  Procedure Laterality Date  . BREAST REDUCTION SURGERY  09/2011  . TUBAL LIGATION  1996   bilateral    OB History    No data available       Home Medications    Prior to Admission medications   Medication Sig Start Date End Date Taking? Authorizing Provider  acetaminophen (TYLENOL) 500 MG tablet Take 2 tablets (1,000 mg total) by mouth every 6 (six) hours as needed. Patient taking differently: Take 1,000 mg by mouth every 6 (six) hours as needed for mild pain.  08/30/15   [name], MD  albuterol (PROVENTIL) (2.5 MG/3ML) 0.083% nebulizer solution Take 3 mLs (2.5 mg total) by nebulization every 4 (four) hours as needed for wheezing or shortness of breath. 12/27/15   Elsie Stain, MD  albuterol (VENTOLIN HFA) 108 (90 Base) MCG/ACT  inhaler INHALE 1-2 PUFFS INTO THE LUNGS EVERY 4 HOURS as needed 03/28/16   Tresa Garter, MD  budesonide-formoterol (SYMBICORT) 160-4.5 MCG/ACT inhaler Inhale 2 puffs into the lungs 2 (two) times daily. 04/18/16 04/18/17  Tresa Garter, MD  DULERA 200-5 MCG/ACT AERO Inhale 2 puffs into the lungs 2 (two) times daily. 10/26/15   Historical Provider, MD  esomeprazole (NEXIUM) 40 MG capsule Take 1 capsule (40 mg total) by mouth daily. 03/22/16   Leo Grosser, MD  fluticasone (FLONASE) 50 MCG/ACT nasal spray Place 2 sprays into both nostrils daily.  12/27/15   Elsie Stain, MD  guaiFENesin (MUCINEX) 600 MG 12 hr tablet Take 1 tablet (600 mg total) by mouth 2 (two) times daily as needed. Patient not taking: Reported on 03/27/2016 12/21/15   [name], MD  hydrochlorothiazide (HYDRODIURIL) 25 MG tablet Take 1 tablet (25 mg total) by mouth daily. 12/27/15   Elsie Stain, MD  HYDROcodone-acetaminophen (NORCO) 5-325 MG tablet Take 1-2 tablets by mouth every 4 (four) hours as needed. Patient not taking: Reported on 03/27/2016 03/13/16   Sherwood Gambler, MD  ibuprofen (ADVIL,MOTRIN) 200 MG tablet Take 600 mg by mouth 2 (two) times daily as needed for moderate pain (knee pain).     Historical Provider, MD  ipratropium (ATROVENT) 0.02 % nebulizer solution Take 2.5 mLs (0.5 mg total) by nebulization 4 (four) times daily as needed for wheezing or shortness of breath. Patient not taking: Reported on 03/27/2016 12/27/15   Elsie Stain, MD  lisinopril (PRINIVIL,ZESTRIL) 10 MG tablet Take 1 tablet (10 mg total) by mouth daily. 03/27/16   Tiffany Daneil Dan, PA-C  loratadine (CLARITIN) 10 MG tablet Take 1 tablet (10 mg total) by mouth daily. 08/30/15   [name], MD  naproxen (NAPROSYN) 375 MG tablet Take 1 tablet (375 mg total) by mouth 2 (two) times daily. Patient not taking: Reported on 03/27/2016 03/11/16   Janne Napoleon, NP  nicotine (NICODERM CQ - DOSED IN MG/24 HOURS) 14 mg/24hr patch Place 14 mg onto the skin 3 (three) times a week.    Historical Provider, MD  Pseudoeph-Doxylamine-DM-APAP (NYQUIL PO) Take 15 mLs by mouth as needed (for cough).    Historical Provider, MD  sertraline (ZOLOFT) 50 MG tablet Take 1 tablet (50 mg total) by mouth daily. Patient not taking: Reported on 03/27/2016 08/30/15   [name], MD  Tiotropium Bromide Monohydrate (SPIRIVA RESPIMAT) 2.5 MCG/ACT AERS Inhale 2 puffs into the lungs daily. Patient not taking: Reported on 03/27/2016 12/27/15   Elsie Stain, MD  traMADol (ULTRAM) 50 MG tablet Take 1 tablet (50 mg  total) by mouth every 8 (eight) hours as needed. 03/27/16   Brayton Caves, PA-C    Family History Family History  Problem Relation Age of Onset  . Hypertension Mother   . Asthma Daughter   . Cancer Paternal Aunt   . Asthma Maternal Grandmother     Social History Social History  Substance Use Topics  . Smoking status: Former Smoker    Years: 18.00    Types: Cigarettes    Quit date: 09/15/2014  . Smokeless tobacco: Never Used  . Alcohol use No     Allergies   Patient has no known allergies.   Review of Systems Review of Systems  Constitutional: Negative for chills and fever.  Respiratory: Positive for cough, shortness of breath and wheezing.   Cardiovascular: Positive for chest pain.  Musculoskeletal: Positive for gait problem.  All other systems reviewed  and are negative.    Physical Exam Updated Vital Signs BP 142/100 (BP Location: Left Arm)   Pulse 97   Temp 98.6 F (37 C) (Oral)   Resp 22   Ht 5\' 6"  (1.676 m)   Wt 300 lb (136.1 kg)   LMP 03/29/2016 (Approximate)   SpO2 96%   BMI 48.42 kg/m   Physical Exam  Constitutional: April [name] is oriented to person, place, and time. April [name] appears well-developed and well-nourished. No distress.  HENT:  Head: Normocephalic and atraumatic.  Eyes: EOM are normal.  Neck: Normal range of motion.  Cardiovascular: Normal rate, regular rhythm and normal heart sounds.   Pulmonary/Chest: Effort normal and breath sounds normal.  Abdominal: Soft. April [name] exhibits no distension. There is no tenderness.  Musculoskeletal: Normal range of motion.  Neurological: April [name] is alert and oriented to person, place, and time.  Skin: Skin is warm and dry.  Psychiatric: April [name] has a normal mood and affect. Judgment normal.  Nursing note and vitals reviewed.   ED Treatments / Results  DIAGNOSTIC STUDIES: Oxygen Saturation is 96% on RA, normal by my interpretation.    COORDINATION OF CARE: 5:41 PM Discussed treatment plan with pt at bedside and pt  agreed to plan.  Labs (all labs ordered are listed, but only abnormal results are displayed) Labs Reviewed  BASIC METABOLIC PANEL - Abnormal; Notable for the following:       Result Value   Potassium 2.8 (*)    Glucose, Bld 111 (*)    BUN <5 (*)    Calcium 8.4 (*)    All other components within normal limits  CBC - Abnormal; Notable for the following:    WBC 16.4 (*)    RDW 17.1 (*)    Platelets 413 (*)    All other components within normal limits  I-STAT TROPOININ, ED    EKG  EKG Interpretation None       Radiology Dg Chest 2 View  Result Date: 04/30/2016 CLINICAL DATA:  Shortness of breath, chest pain EXAM: CHEST  2 VIEW COMPARISON:  03/22/2016 FINDINGS: Cardiomediastinal silhouette is stable. No infiltrate or pleural effusion. No pulmonary edema. Mild degenerative changes lower thoracic spine. IMPRESSION: No active cardiopulmonary disease. Electronically Signed   By: Lahoma Crocker M.D.   On: 04/30/2016 15:08    Procedures Procedures (including critical care time)  Medications Ordered in ED Medications  albuterol (PROVENTIL) (2.5 MG/3ML) 0.083% nebulizer solution 5 mg (5 mg Nebulization Given 04/30/16 1407)     Initial Impression / Assessment and Plan / ED Course  I have reviewed the triage vital signs and the nursing notes.  Pertinent labs & imaging results that were available during my care of the patient were reviewed by me and considered in my medical decision making (see chart for details).  Clinical Course     Patient presents with multiple complaints including shortness of breath, chest discomfort, and knee pain. April [name] is morbidly obese, however her physical examination is otherwise unremarkable. Her lungs sound clear. April [name] has a leukocytosis with a white count of 16,000, however this is not unusual for her and consistent with prior studies. Her physical examination is unremarkable. April [name] will be discharged with prednisone, continued albuterol treatments, and when  necessary return.  Final Clinical Impressions(s) / ED Diagnoses   Final diagnoses:  None    New Prescriptions New Prescriptions   No medications on file     I personally performed the services described in this documentation, which was scribed in  my presence. The recorded information has been reviewed and is accurate.           Veryl Speak, MD 04/30/16 [phone]

## 2016-04-30 NOTE — Progress Notes (Signed)
Pt states that she has hx of asthma. Pt states that she has been having trouble breathing in this cold weather. States only goes out unless for work. Education given to patient on asthma exacerbation of extreme cold weather and how hypersensitive airways can be in weather. Education given on asthma triggers as well. Pt is SOB at rest and BBS of coarse expiratory wheeze throughout. Prolonged exhalation noted. SATs are 99%% on RA pretreatment.   Anell Barr, BS, RRT,RCP

## 2016-04-30 NOTE — ED Notes (Signed)
Pt verbalized understanding discharge instructions and denies any further needs or questions at this time. VS stable, ambulatory and steady gait.   

## 2016-04-30 NOTE — ED Notes (Signed)
Pt just told this RN she will not leave until she feels better.  Pt refuses to sign discharge until doctor would see her again. Md ordered hour long neb for pt.

## 2016-04-30 NOTE — ED Triage Notes (Signed)
Pt in c/o mid SOB & CP that radiates to L shoulder, pt reports onset today, pt has hx of Asthma with no relief with inhaler today, pt c/o L knee, denies injury, pt denies n/v/d, speaks in complete sentences, follows commands, no use of accessory muscles, A&O x4

## 2016-04-30 NOTE — Discharge Instructions (Signed)
Prednisone as prescribed.  2 new your albuterol every 4 hours as needed for wheezing.  Ibuprofen 600 mg every 6 hours as needed for pain.  Follow-up with your primary Dr. if your symptoms are not improving in the next 3-4 days.

## 2016-05-01 NOTE — Telephone Encounter (Signed)
Pt. Called 04/30/16 needing xolair. Faxed SMN and financial papers, pharmacy said they had not received them yet. Pharm. Received them yesterday afteroon. Called Pharmacy back today  Financial statement was filled ouit monthly instead of yearly. Corrected that and sent it again as the authorized rep.. Asked pharmacy to expedite asap pt. Need meds.[name]

## 2016-05-02 NOTE — Telephone Encounter (Signed)
Spoke with Dorothyann Peng at [facility], answered questions regarding application.  They are waiting on a completed SMN to process her application- the form does not have a correct diagnosis code that needs to be refaxed.    Sending back to [employer] to complete as it states below she had previously filled out this form.    TS please advise when this is completed.  [employer]

## 2016-05-02 NOTE — Telephone Encounter (Signed)
Zach from [facility] called to follow up. [phone]. Patient ID# [id number]. Concerning patient's enrollment.

## 2016-05-02 NOTE — Telephone Encounter (Signed)
I faxed the completed SMN form to [facility]. I explained in the comments that the pt. Had to go to the ED 04/30/16 we need her med. Asap.[name]

## 2016-05-03 ENCOUNTER — Telehealth: Payer: Self-pay | Admitting: Internal Medicine

## 2016-05-03 ENCOUNTER — Ambulatory Visit: Payer: Medicaid Other | Attending: Family Medicine | Admitting: Family Medicine

## 2016-05-03 ENCOUNTER — Encounter: Payer: Self-pay | Admitting: Family Medicine

## 2016-05-03 VITALS — BP 180/105 | HR 97 | Temp 97.7°F | Ht 66.0 in | Wt 347.4 lb

## 2016-05-03 DIAGNOSIS — Z809 Family history of malignant neoplasm, unspecified: Secondary | ICD-10-CM | POA: Diagnosis not present

## 2016-05-03 DIAGNOSIS — I152 Hypertension secondary to endocrine disorders: Secondary | ICD-10-CM

## 2016-05-03 DIAGNOSIS — Z8619 Personal history of other infectious and parasitic diseases: Secondary | ICD-10-CM | POA: Insufficient documentation

## 2016-05-03 DIAGNOSIS — E876 Hypokalemia: Secondary | ICD-10-CM | POA: Insufficient documentation

## 2016-05-03 DIAGNOSIS — Z9851 Tubal ligation status: Secondary | ICD-10-CM | POA: Diagnosis not present

## 2016-05-03 DIAGNOSIS — Z825 Family history of asthma and other chronic lower respiratory diseases: Secondary | ICD-10-CM | POA: Insufficient documentation

## 2016-05-03 DIAGNOSIS — G8929 Other chronic pain: Secondary | ICD-10-CM | POA: Diagnosis not present

## 2016-05-03 DIAGNOSIS — G4733 Obstructive sleep apnea (adult) (pediatric): Secondary | ICD-10-CM | POA: Diagnosis not present

## 2016-05-03 DIAGNOSIS — J4551 Severe persistent asthma with (acute) exacerbation: Secondary | ICD-10-CM

## 2016-05-03 DIAGNOSIS — K130 Diseases of lips: Secondary | ICD-10-CM | POA: Insufficient documentation

## 2016-05-03 DIAGNOSIS — Z8249 Family history of ischemic heart disease and other diseases of the circulatory system: Secondary | ICD-10-CM | POA: Insufficient documentation

## 2016-05-03 DIAGNOSIS — Z87891 Personal history of nicotine dependence: Secondary | ICD-10-CM | POA: Insufficient documentation

## 2016-05-03 DIAGNOSIS — R7309 Other abnormal glucose: Secondary | ICD-10-CM

## 2016-05-03 DIAGNOSIS — M25562 Pain in left knee: Secondary | ICD-10-CM | POA: Diagnosis not present

## 2016-05-03 DIAGNOSIS — Z6841 Body Mass Index (BMI) 40.0 and over, adult: Secondary | ICD-10-CM | POA: Diagnosis not present

## 2016-05-03 DIAGNOSIS — K219 Gastro-esophageal reflux disease without esophagitis: Secondary | ICD-10-CM | POA: Diagnosis not present

## 2016-05-03 DIAGNOSIS — G4700 Insomnia, unspecified: Secondary | ICD-10-CM | POA: Diagnosis not present

## 2016-05-03 DIAGNOSIS — F419 Anxiety disorder, unspecified: Secondary | ICD-10-CM | POA: Diagnosis not present

## 2016-05-03 DIAGNOSIS — J45901 Unspecified asthma with (acute) exacerbation: Secondary | ICD-10-CM | POA: Diagnosis not present

## 2016-05-03 DIAGNOSIS — J449 Chronic obstructive pulmonary disease, unspecified: Secondary | ICD-10-CM | POA: Diagnosis not present

## 2016-05-03 DIAGNOSIS — E2609 Other primary hyperaldosteronism: Secondary | ICD-10-CM | POA: Insufficient documentation

## 2016-05-03 DIAGNOSIS — M1711 Unilateral primary osteoarthritis, right knee: Secondary | ICD-10-CM | POA: Insufficient documentation

## 2016-05-03 DIAGNOSIS — I158 Other secondary hypertension: Secondary | ICD-10-CM | POA: Diagnosis not present

## 2016-05-03 DIAGNOSIS — M25561 Pain in right knee: Secondary | ICD-10-CM

## 2016-05-03 DIAGNOSIS — S01511D Laceration without foreign body of lip, subsequent encounter: Secondary | ICD-10-CM

## 2016-05-03 DIAGNOSIS — I10 Essential (primary) hypertension: Secondary | ICD-10-CM

## 2016-05-03 LAB — POCT GLYCOSYLATED HEMOGLOBIN (HGB A1C): HEMOGLOBIN A1C: 6.4

## 2016-05-03 MED ORDER — SPIRONOLACTONE 50 MG PO TABS: 50.0000 mg | ORAL_TABLET | Freq: Every day | ORAL | 2 refills | Status: DC

## 2016-05-03 MED ORDER — POTASSIUM CHLORIDE CRYS ER 20 MEQ PO TBCR: 40.0000 meq | EXTENDED_RELEASE_TABLET | Freq: Every day | ORAL | 0 refills | Status: DC

## 2016-05-03 MED ORDER — PREDNISONE 20 MG PO TABS: ORAL_TABLET | ORAL | 0 refills | Status: DC

## 2016-05-03 MED ORDER — AMLODIPINE BESYLATE 10 MG PO TABS: 10.0000 mg | ORAL_TABLET | Freq: Every day | ORAL | 2 refills | Status: DC

## 2016-05-03 MED ORDER — METHYLPREDNISOLONE ACETATE 40 MG/ML IJ SUSP
40.0000 mg | Freq: Once | INTRAMUSCULAR | Status: AC
  Administered 2016-05-03: 40 mg via INTRAMUSCULAR

## 2016-05-03 MED ORDER — VALACYCLOVIR HCL 1 G PO TABS: 2000.0000 mg | ORAL_TABLET | Freq: Two times a day (BID) | ORAL | 0 refills | Status: DC

## 2016-05-03 MED FILL — ALBUTEROL 0.083% INHAL SOLN: (2.5 MG/3ML | 10 days supply | Qty: 180 | Fill #4

## 2016-05-03 MED FILL — SPIRONOLACTONE 50 MG TABLET: 50 | 30 days supply | Qty: 30 | Fill #0

## 2016-05-03 MED FILL — !DULERA 200 MCG/5 MCG INH: 200-5 | 30 days supply | Qty: 13 | Fill #0

## 2016-05-03 MED FILL — POTASSIUM CL ER 20 MEQ TAB: 20 | 30 days supply | Qty: 10 | Fill #0

## 2016-05-03 MED FILL — ?VALACYCLOVIR HCL 1 GRAM TA: 1 | 1 days supply | Qty: 4 | Fill #0

## 2016-05-03 MED FILL — AMLODIPINE BESYLATE 10 MG T: 10 | 30 days supply | Qty: 30 | Fill #0

## 2016-05-03 MED FILL — !VENTOLIN HFA INHALER: 108 (90 BAS | 16 days supply | Qty: 18 | Fill #1

## 2016-05-03 NOTE — Progress Notes (Signed)
LOGO@  Subjective:  Patient ID: April [name], female    DOB: 23-Jun-1970  Age: 46 y.o. MRN: [id number]  CC: Hypertension   HPI April [name] has morbid obesity, asthma, HTN, OSA, major depression and anxiety she presents for   1. CHRONIC HYPERTENSION since age 66   Disease Monitoring  Blood pressure range: not checking   Chest pain: no   Dyspnea: no   Claudication: no   Medication compliance: not taking lisinopril. Taking HCTZ 25 mg daily. Taking amlodipine 10 mg daily.  Has chronically low potassium.   Medication Side Effects  Lightheadedness: no   Urinary frequency: no   Edema: yes    Preventitive Healthcare:  Exercise: no   Diet Pattern: high sugar   Salt Restriction: no    2. ED f/u asthma: she has chronic asthma. Was in ED on 05/01/2015 for asthma exacerbation and L knee pain. She is taking prednisone. She took 60 mg today and yesterday. She feels better but not back to baseline. She has not had xolair injections in about 6 weeks due to issues with the patient assistance program.    3. L knee pain: coming and going for past 10 years or so. He has known DJD in R knee. She has hx of Baker's cyst. Responded well to steroid injections in the past.   4. Lip laceration: left lower lip. For past 6 weeks. She is unsure of how it became lacerated. No significant pain. No bleeding. She has history of cold sores.   Social History  Substance Use Topics  . Smoking status: Former Smoker    Years: 18.00    Types: Cigarettes    Quit date: 09/15/2014  . Smokeless tobacco: Never Used  . Alcohol use No    Past Medical History:  Diagnosis Date  . Acanthosis nigricans   . Arthritis   . Asthma    Followed by Dr. Annamaria Boots (pulmonology); receives every other week omalizumab injections; has frequent exacerbations  . COPD (chronic obstructive pulmonary disease) ([place])    PFTs in 2002, FEV1/FVC 65, no post bronchodilater test done  . Depression   . GERD (gastroesophageal reflux disease)   .  Headache(784.0)   . Helicobacter pylori (H. pylori) infection   . Hypertension   . Hypertension, essential   . Insomnia   . Menorrhagia   . Morbid obesity ([place])   . Obesity   . Seasonal allergies   . Shortness of breath   . Sleep apnea    Sleep study 2008 - mild OSA, not enough events to titrate CPAP  . Tobacco user     Past Surgical History:  Procedure Laterality Date  . BREAST REDUCTION SURGERY  09/2011  . TUBAL LIGATION  1996   bilateral    Family History  Problem Relation Age of Onset  . Hypertension Mother   . Asthma Daughter   . Cancer Paternal Aunt   . Asthma Maternal Grandmother     Social History  Substance Use Topics  . Smoking status: Former Smoker    Years: 18.00    Types: Cigarettes    Quit date: 09/15/2014  . Smokeless tobacco: Never Used  . Alcohol use No   ROS Review of Systems  Constitutional: Negative for chills and fever.  Eyes: Negative for visual disturbance.  Respiratory: Positive for shortness of breath.   Cardiovascular: Negative for chest pain.  Gastrointestinal: Negative for abdominal pain and blood in stool.  Musculoskeletal: Positive for arthralgias. Negative for back pain.  Skin: Positive for wound. Negative for rash.  Allergic/Immunologic: Negative for immunocompromised state.  Hematological: Negative for adenopathy. Does not bruise/bleed easily.  Psychiatric/Behavioral: Negative for dysphoric mood and suicidal ideas.    Objective:   Today's Vitals: BP (!) 180/105 (BP Location: Left Arm, Patient Position: Sitting, Cuff Size: Large)   Pulse 97   Temp 97.7 F (36.5 C) (Oral)   Ht 5\' 6"  (1.676 m)   Wt (!) 347 lb 6.4 oz (157.6 kg)   LMP 03/29/2016 (Approximate)   SpO2 95%   BMI 56.07 kg/m   Wt Readings from Last 3 Encounters:  05/03/16 (!) 347 lb 6.4 oz (157.6 kg)  04/30/16 300 lb (136.1 kg)  03/27/16 (!) 352 lb (159.7 kg)    Physical Exam  Constitutional: She is oriented to person, place, and time. She appears  well-developed and well-nourished. No distress.  Obese   HENT:  Head: Normocephalic and atraumatic.  Cardiovascular: Normal rate, regular rhythm, normal heart sounds and intact distal pulses.   Pulmonary/Chest: Effort normal and breath sounds normal.  Musculoskeletal: She exhibits no edema.       Left knee: Tenderness found. Medial joint line and lateral joint line tenderness noted.  Neurological: She is alert and oriented to person, place, and time.  Skin: Skin is warm and dry. No rash noted.  Psychiatric: She has a normal mood and affect.   Lab Results  Component Value Date   HGBA1C 6.0 (H) 08/14/2015   Lab Results  Component Value Date   HGBA1C 6.4 05/03/2016     After obtaining informed consent and cleaning the skin using iodine and alcohol a  steroid injection was performed at L knee.  Using 1:1 , 1 ml of  1% plain Lidocaine and 40 mg/ml of Depo Medrol. This was well tolerated.     Chemistry      Component Value Date/Time   NA 138 04/30/2016 1355   K 2.8 (L) 04/30/2016 1355   CL 103 04/30/2016 1355   CO2 28 04/30/2016 1355   BUN <5 (L) 04/30/2016 1355   CREATININE 0.72 04/30/2016 1355   CREATININE 0.82 02/01/2014 1607      Component Value Date/Time   CALCIUM 8.4 (L) 04/30/2016 1355   ALKPHOS 62 01/30/2016 1226   AST 36 01/30/2016 1226   ALT 47 01/30/2016 1226   BILITOT 0.4 01/30/2016 1226      Assessment & Plan:   Problem List Items Addressed This Visit      High   Morbid obesity with body mass index of 50.0-59.9 in adult ([place]) (Chronic)    for weight loss which is important to better control asthma, HTN and chronic pain in knees   Take a hard look at your diet and work on cutting out carbohydrates and sugar Increase lean protein like baked or grilled fish, chicken  Increase vegetables Water as main beverage  Work on getting 30 minutes of exercise in daily, start with less if needed and work up.        Malignant hypertension due to primary  aldosteronism ([facility]) (Chronic)   Relevant Medications   spironolactone (ALDACTONE) 50 MG tablet   amLODipine (NORVASC) 10 MG tablet   Knee pain, bilateral (Chronic)    Chronic knee pain L knee steroid injection       Relevant Medications   methylPREDNISolone acetate (DEPO-MEDROL) injection 40 mg (Completed)   Essential hypertension (Chronic)    Uncontrolled HTN Continue norvasc 10 mg daily Stop HCTZ 25 mg daily,  Start  aldactone 50 mg daily  Advised low salt diet Advised weight loss       Relevant Medications   spironolactone (ALDACTONE) 50 MG tablet   amLODipine (NORVASC) 10 MG tablet     Unprioritized   Lip laceration    Lip lesion Suspect cold sore Plan valtrex       Relevant Medications   valACYclovir (VALTREX) 1000 MG tablet   Hypokalemia    Chronic hypokalemia in setting of HTN consistent with hyper-aldosteronism  Stop HCTZ Start aldactone       Relevant Medications   potassium chloride SA (K-DUR,KLOR-CON) 20 MEQ tablet   Elevated hemoglobin A1c    A1c 6.4 in setting of obesity and steroid use for asthma We discussed reducing sugar in diet Weight loss Using prednisone sparingly prn asthma exacerbation       Relevant Orders   HgB A1c (Completed)   Asthma exacerbation - Primary    Recent exacerbation Continue prednisone taper Patient attempting to restart xolair       Relevant Medications   predniSONE (DELTASONE) 20 MG tablet   methylPREDNISolone acetate (DEPO-MEDROL) injection 40 mg (Completed)      Outpatient Encounter Prescriptions as of 05/03/2016  Medication Sig  . albuterol (PROVENTIL) (2.5 MG/3ML) 0.083% nebulizer solution Take 3 mLs (2.5 mg total) by nebulization every 4 (four) hours as needed for wheezing or shortness of breath.  [name] albuterol (VENTOLIN HFA) 108 (90 Base) MCG/ACT inhaler INHALE 1-2 PUFFS INTO THE LUNGS EVERY 4 HOURS as needed  . DULERA 200-5 MCG/ACT AERO Inhale 2 puffs into the lungs 2 (two) times daily.  [name] esomeprazole  (NEXIUM) 40 MG capsule Take 1 capsule (40 mg total) by mouth daily.  . fluticasone (FLONASE) 50 MCG/ACT nasal spray Place 2 sprays into both nostrils daily.  . hydrochlorothiazide (HYDRODIURIL) 25 MG tablet Take 1 tablet (25 mg total) by mouth daily.  [name] lisinopril (PRINIVIL,ZESTRIL) 10 MG tablet Take 1 tablet (10 mg total) by mouth daily.  [name] loratadine (CLARITIN) 10 MG tablet Take 1 tablet (10 mg total) by mouth daily.  . naproxen (NAPROSYN) 375 MG tablet Take 1 tablet (375 mg total) by mouth 2 (two) times daily.  . nicotine (NICODERM CQ - DOSED IN MG/24 HOURS) 14 mg/24hr patch Place 14 mg onto the skin 3 (three) times a week.  . sertraline (ZOLOFT) 50 MG tablet Take 1 tablet (50 mg total) by mouth daily.  . traMADol (ULTRAM) 50 MG tablet Take 1 tablet (50 mg total) by mouth every 8 (eight) hours as needed.  [name] acetaminophen (TYLENOL) 500 MG tablet Take 2 tablets (1,000 mg total) by mouth every 6 (six) hours as needed. (Patient not taking: Reported on 05/03/2016)  . budesonide-formoterol (SYMBICORT) 160-4.5 MCG/ACT inhaler Inhale 2 puffs into the lungs 2 (two) times daily. (Patient not taking: Reported on 05/03/2016)  . guaiFENesin (MUCINEX) 600 MG 12 hr tablet Take 1 tablet (600 mg total) by mouth 2 (two) times daily as needed. (Patient not taking: Reported on 05/03/2016)  . HYDROcodone-acetaminophen (NORCO) 5-325 MG tablet Take 1-2 tablets by mouth every 4 (four) hours as needed. (Patient not taking: Reported on 05/03/2016)  . ibuprofen (ADVIL,MOTRIN) 200 MG tablet Take 600 mg by mouth 2 (two) times daily as needed for moderate pain (knee pain).   [name] ipratropium (ATROVENT) 0.02 % nebulizer solution Take 2.5 mLs (0.5 mg total) by nebulization 4 (four) times daily as needed for wheezing or shortness of breath. (Patient not taking: Reported on 05/03/2016)  . predniSONE (DELTASONE) 10  MG tablet Take 2 tablets (20 mg total) by mouth 2 (two) times daily with a meal. (Patient not taking: Reported on 05/03/2016)  .  Pseudoeph-Doxylamine-DM-APAP (NYQUIL PO) Take 15 mLs by mouth as needed (for cough).  . Tiotropium Bromide Monohydrate (SPIRIVA RESPIMAT) 2.5 MCG/ACT AERS Inhale 2 puffs into the lungs daily. (Patient not taking: Reported on 05/03/2016)   Facility-Administered Encounter Medications as of 05/03/2016  Medication  . omalizumab Arvid Right) injection 225 mg    Follow-up: Return in about 2 weeks (around 05/17/2016) for HTN .    Boykin Nearing MD

## 2016-05-03 NOTE — Telephone Encounter (Signed)
[facility] said they could not ship April [name]'s xolair until 05/07/16. Will call pt. And let her know. I'm going to create a separate encounter for her xolair order. Nothing further needed.

## 2016-05-03 NOTE — Telephone Encounter (Signed)
#   vials:[id number] Ordered date:05/03/16 Shipping Date:05/06/16

## 2016-05-03 NOTE — Assessment & Plan Note (Signed)
for weight loss which is important to better control asthma, HTN and chronic pain in knees   Take a hard look at your diet and work on cutting out carbohydrates and sugar Increase lean protein like baked or grilled fish, chicken  Increase vegetables Water as main beverage  Work on getting 30 minutes of exercise in daily, start with less if needed and work up.

## 2016-05-03 NOTE — Patient Instructions (Addendum)
April [name] was seen today for hypertension.  Diagnoses and all orders for this visit:  Severe persistent asthma with exacerbation -     predniSONE (DELTASONE) 20 MG tablet; Take every morning with food 60 mg daily for 3 days, 40 mg daily for 3 days, 30 mg daily for 3 days, 20 mg daily for 3 days, 10 mg daily for 3 days then STOP  Hypokalemia -     potassium chloride SA (K-DUR,KLOR-CON) 20 MEQ tablet; Take 2 tablets (40 mEq total) by mouth daily.  Chronic pain of both knees -     methylPREDNISolone acetate (DEPO-MEDROL) injection 40 mg; Inject 1 mL (40 mg total) into the muscle once.  Essential hypertension -     spironolactone (ALDACTONE) 50 MG tablet; Take 1 tablet (50 mg total) by mouth daily. -     amLODipine (NORVASC) 10 MG tablet; Take 1 tablet (10 mg total) by mouth daily.  Elevated hemoglobin A1c -     HgB A1c  Lip laceration, subsequent encounter -     valACYclovir (VALTREX) 1000 MG tablet; Take 2 tablets (2,000 mg total) by mouth 2 (two) times daily.  for weight loss which is important to better control asthma, HTN and chronic pain in knees   Take a hard look at your diet and work on cutting out carbohydrates and sugar Increase lean protein like baked or grilled fish, chicken  Increase vegetables Water as main beverage  Work on getting 30 minutes of exercise in daily, start with less if needed and work up.   You have received a shot of steroid in your joint today. Rest and ice knee today. Regular activity tomorrow. Look out for redness, swelling, fever,severe pain in joint and call if you experience these symptoms.  Take potassium supplement for next 5 days to help raise levels to normal range Stop HCTZ  Do not take lisinopril   F/u in 2 weeks or HTN   Dr. Adrian Blackwater

## 2016-05-05 ENCOUNTER — Encounter (HOSPITAL_COMMUNITY): Payer: Self-pay

## 2016-05-05 ENCOUNTER — Inpatient Hospital Stay (HOSPITAL_COMMUNITY)
Admission: EM | Admit: 2016-05-05 | Discharge: 2016-05-07 | DRG: 202 | Disposition: A | Discharge status: Home / Self Care | Payer: Medicaid Other | Attending: Internal Medicine | Admitting: Internal Medicine

## 2016-05-05 ENCOUNTER — Emergency Department (HOSPITAL_COMMUNITY): Payer: Medicaid Other

## 2016-05-05 DIAGNOSIS — R7309 Other abnormal glucose: Secondary | ICD-10-CM | POA: Insufficient documentation

## 2016-05-05 DIAGNOSIS — Z6841 Body Mass Index (BMI) 40.0 and over, adult: Secondary | ICD-10-CM

## 2016-05-05 DIAGNOSIS — Z809 Family history of malignant neoplasm, unspecified: Secondary | ICD-10-CM

## 2016-05-05 DIAGNOSIS — K219 Gastro-esophageal reflux disease without esophagitis: Secondary | ICD-10-CM | POA: Diagnosis present

## 2016-05-05 DIAGNOSIS — R0603 Acute respiratory distress: Secondary | ICD-10-CM

## 2016-05-05 DIAGNOSIS — Z825 Family history of asthma and other chronic lower respiratory diseases: Secondary | ICD-10-CM

## 2016-05-05 DIAGNOSIS — I10 Essential (primary) hypertension: Secondary | ICD-10-CM | POA: Diagnosis present

## 2016-05-05 DIAGNOSIS — Z7951 Long term (current) use of inhaled steroids: Secondary | ICD-10-CM

## 2016-05-05 DIAGNOSIS — Z87891 Personal history of nicotine dependence: Secondary | ICD-10-CM

## 2016-05-05 DIAGNOSIS — Z8249 Family history of ischemic heart disease and other diseases of the circulatory system: Secondary | ICD-10-CM

## 2016-05-05 DIAGNOSIS — I152 Hypertension secondary to endocrine disorders: Secondary | ICD-10-CM

## 2016-05-05 DIAGNOSIS — E7439 Other disorders of intestinal carbohydrate absorption: Secondary | ICD-10-CM | POA: Diagnosis present

## 2016-05-05 DIAGNOSIS — J449 Chronic obstructive pulmonary disease, unspecified: Secondary | ICD-10-CM | POA: Diagnosis present

## 2016-05-05 DIAGNOSIS — J4551 Severe persistent asthma with (acute) exacerbation: Principal | ICD-10-CM | POA: Diagnosis present

## 2016-05-05 DIAGNOSIS — E876 Hypokalemia: Secondary | ICD-10-CM | POA: Diagnosis present

## 2016-05-05 DIAGNOSIS — E662 Morbid (severe) obesity with alveolar hypoventilation: Secondary | ICD-10-CM | POA: Diagnosis present

## 2016-05-05 DIAGNOSIS — E2609 Other primary hyperaldosteronism: Secondary | ICD-10-CM | POA: Insufficient documentation

## 2016-05-05 LAB — BASIC METABOLIC PANEL
Anion gap: 9 (ref 5–15)
BUN: 6 mg/dL (ref 6–20)
CALCIUM: 8.6 mg/dL — AB (ref 8.9–10.3)
CHLORIDE: 104 mmol/L (ref 101–111)
CO2: 26 mmol/L (ref 22–32)
CREATININE: 0.7 mg/dL (ref 0.44–1.00)
Glucose, Bld: 122 mg/dL — ABNORMAL HIGH (ref 65–99)
Potassium: 2.8 mmol/L — ABNORMAL LOW (ref 3.5–5.1)
SODIUM: 139 mmol/L (ref 135–145)

## 2016-05-05 LAB — CBC
HCT: 35.9 % — ABNORMAL LOW (ref 36.0–46.0)
Hemoglobin: 11.1 g/dL — ABNORMAL LOW (ref 12.0–15.0)
MCH: 26 pg (ref 26.0–34.0)
MCHC: 30.9 g/dL (ref 30.0–36.0)
MCV: 84.1 fL (ref 78.0–100.0)
PLATELETS: 424 10*3/uL — AB (ref 150–400)
RBC: 4.27 MIL/uL (ref 3.87–5.11)
RDW: 16.8 % — AB (ref 11.5–15.5)
WBC: 19.1 10*3/uL — AB (ref 4.0–10.5)

## 2016-05-05 MED ORDER — MOMETASONE FURO-FORMOTEROL FUM 200-5 MCG/ACT IN AERO
2.0000 | INHALATION_SPRAY | Freq: Two times a day (BID) | RESPIRATORY_TRACT | Status: DC
  Administered 2016-05-05 – 2016-05-07 (×4): 2 via RESPIRATORY_TRACT
  Filled 2016-05-05: qty 8.8

## 2016-05-05 MED ORDER — ALBUTEROL SULFATE (2.5 MG/3ML) 0.083% IN NEBU
5.0000 mg | INHALATION_SOLUTION | RESPIRATORY_TRACT | Status: DC | PRN
  Administered 2016-05-06: 5 mg via RESPIRATORY_TRACT
  Filled 2016-05-05 (×2): qty 6

## 2016-05-05 MED ORDER — METHYLPREDNISOLONE SODIUM SUCC 125 MG IJ SOLR
125.0000 mg | Freq: Once | INTRAMUSCULAR | Status: AC
  Administered 2016-05-05: 125 mg via INTRAVENOUS
  Filled 2016-05-05: qty 2

## 2016-05-05 MED ORDER — LORATADINE 10 MG PO TABS
10.0000 mg | ORAL_TABLET | Freq: Every day | ORAL | Status: DC
  Administered 2016-05-05 – 2016-05-07 (×3): 10 mg via ORAL
  Filled 2016-05-05 (×3): qty 1

## 2016-05-05 MED ORDER — ALBUTEROL (5 MG/ML) CONTINUOUS INHALATION SOLN
10.0000 mg/h | INHALATION_SOLUTION | RESPIRATORY_TRACT | Status: DC
  Administered 2016-05-05: 10 mg/h via RESPIRATORY_TRACT
  Filled 2016-05-05: qty 20

## 2016-05-05 MED ORDER — METHYLPREDNISOLONE SODIUM SUCC 125 MG IJ SOLR
80.0000 mg | Freq: Two times a day (BID) | INTRAMUSCULAR | Status: DC
  Administered 2016-05-05: 80 mg via INTRAVENOUS
  Filled 2016-05-05: qty 2

## 2016-05-05 MED ORDER — ALBUTEROL SULFATE (2.5 MG/3ML) 0.083% IN NEBU
5.0000 mg | INHALATION_SOLUTION | Freq: Once | RESPIRATORY_TRACT | Status: AC
  Administered 2016-05-05: 5 mg via RESPIRATORY_TRACT
  Filled 2016-05-05: qty 6

## 2016-05-05 MED ORDER — FLUTICASONE PROPIONATE 50 MCG/ACT NA SUSP
2.0000 | Freq: Every day | NASAL | Status: DC
  Administered 2016-05-05 – 2016-05-07 (×3): 2 via NASAL
  Filled 2016-05-05: qty 16

## 2016-05-05 MED ORDER — SERTRALINE HCL 50 MG PO TABS
50.0000 mg | ORAL_TABLET | Freq: Every day | ORAL | Status: DC
  Administered 2016-05-05 – 2016-05-07 (×3): 50 mg via ORAL
  Filled 2016-05-05 (×3): qty 1

## 2016-05-05 MED ORDER — AMLODIPINE BESYLATE 10 MG PO TABS
10.0000 mg | ORAL_TABLET | Freq: Every day | ORAL | Status: DC
  Administered 2016-05-05 – 2016-05-07 (×3): 10 mg via ORAL
  Filled 2016-05-05 (×3): qty 1

## 2016-05-05 MED ORDER — POTASSIUM CHLORIDE CRYS ER 20 MEQ PO TBCR
40.0000 meq | EXTENDED_RELEASE_TABLET | Freq: Every day | ORAL | Status: DC
  Administered 2016-05-05 – 2016-05-06 (×2): 40 meq via ORAL
  Filled 2016-05-05 (×2): qty 2

## 2016-05-05 MED ORDER — MAGNESIUM SULFATE 2 GM/50ML IV SOLN
2.0000 g | Freq: Once | INTRAVENOUS | Status: AC
  Administered 2016-05-05: 2 g via INTRAVENOUS
  Filled 2016-05-05: qty 50

## 2016-05-05 MED ORDER — ACETAMINOPHEN 650 MG RE SUPP: 650.0000 mg | Freq: Four times a day (QID) | RECTAL | Status: DC | PRN

## 2016-05-05 MED ORDER — SODIUM CHLORIDE 0.9 % IV SOLN
INTRAVENOUS | Status: DC
  Administered 2016-05-05: 18:00:00 via INTRAVENOUS

## 2016-05-05 MED ORDER — NICOTINE 14 MG/24HR TD PT24
14.0000 mg | MEDICATED_PATCH | Freq: Every day | TRANSDERMAL | Status: DC
  Administered 2016-05-05 – 2016-05-07 (×3): 14 mg via TRANSDERMAL
  Filled 2016-05-05 (×3): qty 1

## 2016-05-05 MED ORDER — TRAZODONE HCL 50 MG PO TABS
50.0000 mg | ORAL_TABLET | Freq: Every evening | ORAL | Status: DC | PRN
  Administered 2016-05-07: 50 mg via ORAL
  Filled 2016-05-05: qty 1

## 2016-05-05 MED ORDER — TIOTROPIUM BROMIDE MONOHYDRATE 2.5 MCG/ACT IN AERS: 2.0000 | INHALATION_SPRAY | Freq: Every day | RESPIRATORY_TRACT | Status: DC

## 2016-05-05 MED ORDER — VALACYCLOVIR HCL 500 MG PO TABS
2000.0000 mg | ORAL_TABLET | Freq: Two times a day (BID) | ORAL | Status: DC
  Administered 2016-05-05 – 2016-05-07 (×4): 2000 mg via ORAL
  Filled 2016-05-05 (×4): qty 4

## 2016-05-05 MED ORDER — IPRATROPIUM BROMIDE 0.02 % IN SOLN
0.5000 mg | Freq: Four times a day (QID) | RESPIRATORY_TRACT | Status: DC | PRN
  Administered 2016-05-06: 0.5 mg via RESPIRATORY_TRACT
  Filled 2016-05-05 (×2): qty 2.5

## 2016-05-05 MED ORDER — ONDANSETRON HCL 4 MG/2ML IJ SOLN: 4.0000 mg | Freq: Four times a day (QID) | INTRAMUSCULAR | Status: DC | PRN

## 2016-05-05 MED ORDER — IBUPROFEN 200 MG PO TABS
600.0000 mg | ORAL_TABLET | Freq: Two times a day (BID) | ORAL | Status: DC | PRN
  Administered 2016-05-07: 600 mg via ORAL
  Filled 2016-05-05: qty 3

## 2016-05-05 MED ORDER — IPRATROPIUM BROMIDE 0.02 % IN SOLN
0.5000 mg | Freq: Once | RESPIRATORY_TRACT | Status: AC
  Administered 2016-05-05: 0.5 mg via RESPIRATORY_TRACT
  Filled 2016-05-05: qty 2.5

## 2016-05-05 MED ORDER — ENOXAPARIN SODIUM 40 MG/0.4ML ~~LOC~~ SOLN
40.0000 mg | SUBCUTANEOUS | Status: DC
  Administered 2016-05-05 – 2016-05-06 (×2): 40 mg via SUBCUTANEOUS
  Filled 2016-05-05 (×2): qty 0.4

## 2016-05-05 MED ORDER — ONDANSETRON HCL 4 MG PO TABS: 4.0000 mg | ORAL_TABLET | Freq: Four times a day (QID) | ORAL | Status: DC | PRN

## 2016-05-05 MED ORDER — PANTOPRAZOLE SODIUM 40 MG PO TBEC
40.0000 mg | DELAYED_RELEASE_TABLET | Freq: Every day | ORAL | Status: DC
  Administered 2016-05-05 – 2016-05-07 (×3): 40 mg via ORAL
  Filled 2016-05-05 (×3): qty 1

## 2016-05-05 MED ORDER — POTASSIUM CHLORIDE CRYS ER 20 MEQ PO TBCR
40.0000 meq | EXTENDED_RELEASE_TABLET | Freq: Once | ORAL | Status: AC
  Administered 2016-05-05: 40 meq via ORAL
  Filled 2016-05-05: qty 2

## 2016-05-05 MED ORDER — ACETAMINOPHEN 325 MG PO TABS: 650.0000 mg | ORAL_TABLET | Freq: Four times a day (QID) | ORAL | Status: DC | PRN

## 2016-05-05 NOTE — ED Notes (Signed)
Patient transported to X-ray 

## 2016-05-05 NOTE — Assessment & Plan Note (Signed)
Chronic hypokalemia in setting of HTN consistent with hyper-aldosteronism  Stop HCTZ Start aldactone

## 2016-05-05 NOTE — Assessment & Plan Note (Signed)
Lip lesion Suspect cold sore Plan valtrex

## 2016-05-05 NOTE — ED Provider Notes (Signed)
[facility] DEPT Provider Note   CSN: [id number] Arrival date & time: 05/05/16  1507     History   Chief Complaint Chief Complaint  Patient presents with  . Asthma    HPI April [name] is a 46 y.o. female.  Patient w hx asthma, c/o increased non prod cough, and increased sob and wheezing in the past week. Symptoms persistent, constant, worse in past couple days.  No chest pain or discomfort. Denies sore throat or runny nose. No fever or chills. Compliant w home meds, states uses neb/mdi several x per day. Was on prednisone taper last week. Denies leg pain or swelling. No known ill contacts.    The history is provided by the patient.  Asthma  Associated symptoms include shortness of breath. Pertinent negatives include no chest pain, no abdominal pain and no headaches.    Past Medical History:  Diagnosis Date  . Acanthosis nigricans   . Arthritis   . Asthma    Followed by Dr. Annamaria Boots (pulmonology); receives every other week omalizumab injections; has frequent exacerbations  . COPD (chronic obstructive pulmonary disease) ([place])    PFTs in 2002, FEV1/FVC 65, no post bronchodilater test done  . Depression   . GERD (gastroesophageal reflux disease)   . Headache(784.0)   . Helicobacter pylori (H. pylori) infection   . Hypertension   . Hypertension, essential   . Insomnia   . Menorrhagia   . Morbid obesity ([place])   . Obesity   . Seasonal allergies   . Shortness of breath   . Sleep apnea    Sleep study 2008 - mild OSA, not enough events to titrate CPAP  . Tobacco user     Patient Active Problem List   Diagnosis Date Noted  . Malignant hypertension due to primary aldosteronism ([place]) 05/05/2016  . Lip laceration 05/05/2016  . Elevated hemoglobin A1c 05/05/2016  . Asthma exacerbation 11/10/2015  . GERD (gastroesophageal reflux disease) 08/30/2015  . Generalized anxiety disorder 08/30/2015  . Seasonal allergic rhinitis 08/29/2013  . Asthma, chronic obstructive, without  status asthmaticus ([place]) 05/07/2012  . Knee pain, bilateral 04/25/2011  . Obstructive sleep apnea 12/19/2010  . Hypokalemia 08/13/2010  . Cervical back pain with evidence of disc disease 04/08/2008  . Essential hypertension 07/31/2006  . Morbid obesity with body mass index of 50.0-59.9 in adult ([place]) 06/17/2006  . Major depressive disorder, recurrent episode ([place]) 04/10/2006    Past Surgical History:  Procedure Laterality Date  . BREAST REDUCTION SURGERY  09/2011  . TUBAL LIGATION  1996   bilateral    OB History    No data available       Home Medications    Prior to Admission medications   Medication Sig Start Date End Date Taking? Authorizing Provider  acetaminophen (TYLENOL) 500 MG tablet Take 2 tablets (1,000 mg total) by mouth every 6 (six) hours as needed. Patient not taking: Reported on 05/03/2016 08/30/15   [name], MD  albuterol (PROVENTIL) (2.5 MG/3ML) 0.083% nebulizer solution Take 3 mLs (2.5 mg total) by nebulization every 4 (four) hours as needed for wheezing or shortness of breath. 12/27/15   Elsie Stain, MD  albuterol (VENTOLIN HFA) 108 (90 Base) MCG/ACT inhaler INHALE 1-2 PUFFS INTO THE LUNGS EVERY 4 HOURS as needed 03/28/16   Tresa Garter, MD  amLODipine (NORVASC) 10 MG tablet Take 1 tablet (10 mg total) by mouth daily. 05/03/16   Boykin Nearing, MD  budesonide-formoterol (SYMBICORT) 160-4.5 MCG/ACT inhaler Inhale 2 puffs into  the lungs 2 (two) times daily. Patient not taking: Reported on 05/03/2016 04/18/16 04/18/17  Tresa Garter, MD  DULERA 200-5 MCG/ACT AERO Inhale 2 puffs into the lungs 2 (two) times daily. 10/26/15   Historical Provider, MD  esomeprazole (NEXIUM) 40 MG capsule Take 1 capsule (40 mg total) by mouth daily. 03/22/16   Leo Grosser, MD  fluticasone (FLONASE) 50 MCG/ACT nasal spray Place 2 sprays into both nostrils daily. 12/27/15   Elsie Stain, MD  HYDROcodone-acetaminophen (NORCO) 5-325 MG tablet Take 1-2 tablets by mouth  every 4 (four) hours as needed. Patient not taking: Reported on 05/03/2016 03/13/16   Sherwood Gambler, MD  ibuprofen (ADVIL,MOTRIN) 200 MG tablet Take 600 mg by mouth 2 (two) times daily as needed for moderate pain (knee pain).     Historical Provider, MD  ipratropium (ATROVENT) 0.02 % nebulizer solution Take 2.5 mLs (0.5 mg total) by nebulization 4 (four) times daily as needed for wheezing or shortness of breath. Patient not taking: Reported on 05/03/2016 12/27/15   Elsie Stain, MD  loratadine (CLARITIN) 10 MG tablet Take 1 tablet (10 mg total) by mouth daily. 08/30/15   [name], MD  naproxen (NAPROSYN) 375 MG tablet Take 1 tablet (375 mg total) by mouth 2 (two) times daily. 03/11/16   Janne Napoleon, NP  nicotine (NICODERM CQ - DOSED IN MG/24 HOURS) 14 mg/24hr patch Place 14 mg onto the skin 3 (three) times a week.    Historical Provider, MD  potassium chloride SA (K-DUR,KLOR-CON) 20 MEQ tablet Take 2 tablets (40 mEq total) by mouth daily. 05/03/16   Josalyn Funches, MD  predniSONE (DELTASONE) 20 MG tablet Take every morning with food 60 mg daily for 3 days, 40 mg daily for 3 days, 30 mg daily for 3 days, 20 mg daily for 3 days, 10 mg daily for 3 days then STOP 05/03/16   Josalyn Funches, MD  Pseudoeph-Doxylamine-DM-APAP (NYQUIL PO) Take 15 mLs by mouth as needed (for cough).    Historical Provider, MD  sertraline (ZOLOFT) 50 MG tablet Take 1 tablet (50 mg total) by mouth daily. 08/30/15   [name], MD  spironolactone (ALDACTONE) 50 MG tablet Take 1 tablet (50 mg total) by mouth daily. 05/03/16   Josalyn Funches, MD  Tiotropium Bromide Monohydrate (SPIRIVA RESPIMAT) 2.5 MCG/ACT AERS Inhale 2 puffs into the lungs daily. Patient not taking: Reported on 05/03/2016 12/27/15   Elsie Stain, MD  traMADol (ULTRAM) 50 MG tablet Take 1 tablet (50 mg total) by mouth every 8 (eight) hours as needed. 03/27/16   Tiffany Daneil Dan, PA-C  valACYclovir (VALTREX) 1000 MG tablet Take 2 tablets (2,000 mg total) by mouth 2  (two) times daily. 05/03/16 05/05/16  Boykin Nearing, MD    Family History Family History  Problem Relation Age of Onset  . Hypertension Mother   . Asthma Daughter   . Cancer Paternal Aunt   . Asthma Maternal Grandmother     Social History Social History  Substance Use Topics  . Smoking status: Former Smoker    Years: 18.00    Types: Cigarettes    Quit date: 09/15/2014  . Smokeless tobacco: Never Used  . Alcohol use No     Allergies   Patient has no known allergies.   Review of Systems Review of Systems  Constitutional: Negative for chills and fever.  HENT: Negative for sore throat.   Eyes: Negative for redness.  Respiratory: Positive for cough, shortness of breath and wheezing.   Cardiovascular:  Negative for chest pain.  Gastrointestinal: Negative for abdominal pain, diarrhea and vomiting.  Genitourinary: Negative for flank pain.  Musculoskeletal: Negative for neck pain and neck stiffness.  Skin: Negative for rash.  Neurological: Negative for headaches.  Hematological: Does not bruise/bleed easily.  Psychiatric/Behavioral: Negative for confusion.     Physical Exam Updated Vital Signs BP 162/95   Pulse 87   Temp 98.3 F (36.8 C) (Oral)   Resp 22   LMP 03/29/2016 (Approximate)   SpO2 99%   Physical Exam  Constitutional: She appears well-developed and well-nourished.  HENT:  Mouth/Throat: Oropharynx is clear and moist.  Eyes: Conjunctivae are normal. No scleral icterus.  Neck: Neck supple. No tracheal deviation present.  Cardiovascular: Normal rate, regular rhythm, normal heart sounds and intact distal pulses.   Pulmonary/Chest: She is in respiratory distress. She has wheezes.  Abdominal: Soft. Normal appearance. She exhibits no distension. There is no tenderness.  obese  Musculoskeletal: She exhibits no edema.  Neurological: She is alert.  Skin: Skin is warm and dry. No rash noted.  Psychiatric: She has a normal mood and affect.  Nursing note and vitals  reviewed.    ED Treatments / Results  Labs (all labs ordered are listed, but only abnormal results are displayed) Results for orders placed or performed during the hospital encounter of [id number]  Basic metabolic panel  Result Value Ref Range   Sodium 139 135 - 145 mmol/L   Potassium 2.8 (L) 3.5 - 5.1 mmol/L   Chloride 104 101 - 111 mmol/L   CO2 26 22 - 32 mmol/L   Glucose, Bld 122 (H) 65 - 99 mg/dL   BUN 6 6 - 20 mg/dL   Creatinine, Ser 0.70 0.44 - 1.00 mg/dL   Calcium 8.6 (L) 8.9 - 10.3 mg/dL   GFR calc non Af Amer >60 >60 mL/min   GFR calc Af Amer >60 >60 mL/min   Anion gap 9 5 - 15  CBC  Result Value Ref Range   WBC 19.1 (H) 4.0 - 10.5 K/uL   RBC 4.27 3.87 - 5.11 MIL/uL   Hemoglobin 11.1 (L) 12.0 - 15.0 g/dL   HCT 35.9 (L) 36.0 - 46.0 %   MCV 84.1 78.0 - 100.0 fL   MCH 26.0 26.0 - 34.0 pg   MCHC 30.9 30.0 - 36.0 g/dL   RDW 16.8 (H) 11.5 - 15.5 %   Platelets 424 (H) 150 - 400 K/uL   *Note: Due to a large number of results and/or encounters for the requested time period, some results have not been displayed. A complete set of results can be found in Results Review.   Dg Chest 2 View  Result Date: 05/05/2016 CLINICAL DATA:  46 year old presenting with 1 week history of wheezing that is not being treated with rescue inhaler or nebulizer treatments at home. Current history of asthma. EXAM: CHEST  2 VIEW COMPARISON:  04/30/2016, 03/22/2016 and earlier. FINDINGS: Cardiomediastinal silhouette unremarkable, unchanged. Mild central peribronchial thickening, similar to the prior examinations. Lungs otherwise clear. No localized airspace consolidation. No pleural effusions. No pneumothorax. Normal pulmonary vascularity. Visualized bony thorax intact. IMPRESSION: Stable mild changes of chronic bronchitis and/or asthma. No acute cardiopulmonary disease. Electronically Signed   By: Evangeline Dakin M.D.   On: 05/05/2016 17:01   Dg Chest 2 View  Result Date: 04/30/2016 CLINICAL DATA:   Shortness of breath, chest pain EXAM: CHEST  2 VIEW COMPARISON:  03/22/2016 FINDINGS: Cardiomediastinal silhouette is stable. No infiltrate or pleural effusion. No pulmonary  edema. Mild degenerative changes lower thoracic spine. IMPRESSION: No active cardiopulmonary disease. Electronically Signed   By: Lahoma [place] M.D.   On: 04/30/2016 15:08    EKG  EKG Interpretation None       Radiology No results found.  Procedures Procedures (including critical care time)  Medications Ordered in ED Medications  0.9 %  sodium chloride infusion (not administered)  albuterol (PROVENTIL) (2.5 MG/3ML) 0.083% nebulizer solution 5 mg (not administered)  ipratropium (ATROVENT) nebulizer solution 0.5 mg (not administered)  methylPREDNISolone sodium succinate (SOLU-MEDROL) 125 mg/2 mL injection 125 mg (not administered)     Initial Impression / Assessment and Plan / ED Course  I have reviewed the triage vital signs and the nursing notes.  Pertinent labs & imaging results that were available during my care of the patient were reviewed by me and considered in my medical decision making (see chart for details).  Clinical Course    Iv ns. Albuterol and atrovent neb.  Continuous pulse ox and monitor. o2 [place]. Stat labs and xrays.   Persistent wheezing.  Solumedrol iv.  Continuous albuterol neb tx over 1 hr. Respiratory therapy consulted.   k low, kcl given.  Patient remains wheezing, sob/resp distress.  Magnesium iv.  Continuous albuterol neb over 1 hour.  Unassigned medicine consulted for admission.  CRITICAL CARE  RE respiratory distress/asthma exacerbation, requiring multiple nebs, continuous nebs, iv magnesium, iv steroids.  Performed by: Mirna Mires Total critical care time: 35 minutes Critical care time was exclusive of separately billable procedures and treating other patients. Critical care was necessary to treat or prevent imminent or life-threatening deterioration. Critical care  was time spent personally by me on the following activities: development of treatment plan with patient and/or surrogate as well as nursing, discussions with consultants, evaluation of patient's response to treatment, examination of patient, obtaining history from patient or surrogate, ordering and performing treatments and interventions, ordering and review of laboratory studies, ordering and review of radiographic studies, pulse oximetry and re-evaluation of patient's condition.   Final Clinical Impressions(s) / ED Diagnoses   Final diagnoses:  None    New Prescriptions New Prescriptions   No medications on file     Lajean Saver, MD 05/05/16 1800

## 2016-05-05 NOTE — Progress Notes (Signed)
Pt. Arrived from ED to the floor.

## 2016-05-05 NOTE — Assessment & Plan Note (Signed)
A1c 6.4 in setting of obesity and steroid use for asthma We discussed reducing sugar in diet Weight loss Using prednisone sparingly prn asthma exacerbation

## 2016-05-05 NOTE — Assessment & Plan Note (Signed)
Chronic knee pain L knee steroid injection

## 2016-05-05 NOTE — Assessment & Plan Note (Signed)
Uncontrolled HTN Continue norvasc 10 mg daily Stop HCTZ 25 mg daily,  Start aldactone 50 mg daily  Advised low salt diet Advised weight loss

## 2016-05-05 NOTE — H&P (Signed)
History and Physical    April [name] [id number] DOB: 02/03/1971 DOA: 05/05/2016  PCP: [name], MD Consultants:  Annamaria Boots - pulmonology Patient coming from: home - lives alone; NOK: sister, [phone]  Chief Complaint: SOB  HPI: April [name] is a 46 y.o. female with medical history significant of morbid obesity, ongoing intermittent tobacco use, severe persistent asthma, HTN, and depression presenting with an asthma exacerbation.  Had symptoms before Christmas, got better and then came back.  Symptoms resumed about 1 week ago, right before New Year's.  Has treated with home prednisone 40mg  daily x 3-4 days (leftover pills) and neb treatments.  Wheezing wouldn't stop today and so decided to come in to ER again (was also seen Thursday - given hour-long treatment and give Prednisone taper).  No fevers.  No sick contacts.  Denies smoking, but then reports that it "comes and goes."  Is requesting a patch.  Last cigarette was maybe a month ago?  Has been hospitalized multiple times for asthma in the past, no intubations.  ER visits/hospitalizations in the last 6 months include: 7/14-15 - hospitalization for asthma exacerbation and GERD 7/27 - Urgent Care visit 7/30-31 - hospitalization 8/21 - ER visit 8/22-24 - hospitalization 10/3-4 - hospitalization 11/1 - ER visit 11/13 - ER visit for a fall 11/15 - ER visit for knee pain 11/18 - ER visit for knee pain 11/24 - ER visit for asthma exacerbation 1/2 - ER visit for asthma and knee pain 1/5 - PCP visit for follow-up    ED Course: Per Dr. Ashok Cordia: Iv ns. Albuterol and atrovent neb. Continuous pulse ox and monitor. o2 [place]. Stat labs and xrays.  Persistent wheezing.  Solumedrol iv. Continuous albuterol neb tx over 1 hr. Respiratory therapy consulted.  k low, kcl given.  Patient remains wheezing, sob/resp distress.  Magnesium iv.  Continuous albuterol neb over 1 hour.  Unassigned medicine consulted for admission.  Review of  Systems: As per HPI; otherwise 10 point review of systems reviewed and negative.   Ambulatory Status:  ambulates without assistance  Past Medical History:  Diagnosis Date  . Acanthosis nigricans   . Arthritis   . Asthma    Followed by Dr. Annamaria Boots (pulmonology); receives every other week omalizumab injections; has frequent exacerbations  . COPD (chronic obstructive pulmonary disease) ([place])    PFTs in 2002, FEV1/FVC 65, no post bronchodilater test done  . Depression   . GERD (gastroesophageal reflux disease)   . Headache(784.0)   . Helicobacter pylori (H. pylori) infection   . Hypertension, essential   . Insomnia   . Menorrhagia   . Morbid obesity ([place])   . Obesity   . Seasonal allergies   . Shortness of breath   . Sleep apnea    Sleep study 2008 - mild OSA, not enough events to titrate CPAP  . Tobacco user     Past Surgical History:  Procedure Laterality Date  . BREAST REDUCTION SURGERY  09/2011  . TUBAL LIGATION  1996   bilateral    Social History   Social History  . Marital status: Single    Spouse name: N/A  . Number of children: 1  . Years of education: N/A   Occupational History  . CNA    Social History Main Topics  . Smoking status: Former Smoker    Years: 18.00    Types: Cigarettes  . Smokeless tobacco: Never Used  . Alcohol use No  . Drug use: No  . Sexual activity:  Not on file   Other Topics Concern  . 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     No Known Allergies  Family History  Problem Relation Age of Onset  . Hypertension Mother   . Asthma Daughter   . Cancer Paternal Aunt   . Asthma Maternal Grandmother     Prior to Admission medications   Medication Sig Start Date End Date Taking? Authorizing Provider  acetaminophen (TYLENOL) 500 MG tablet Take 2 tablets (1,000 mg total) by mouth every 6 (six)  hours as needed. 08/30/15  Yes [name], MD  albuterol (PROVENTIL) (2.5 MG/3ML) 0.083% nebulizer solution Take 3 mLs (2.5 mg total) by nebulization every 4 (four) hours as needed for wheezing or shortness of breath. 12/27/15  Yes Elsie Stain, MD  albuterol (VENTOLIN HFA) 108 (90 Base) MCG/ACT inhaler INHALE 1-2 PUFFS INTO THE LUNGS EVERY 4 HOURS as needed Patient taking differently: Inhale 2 puffs into the lungs every 4 (four) hours as needed for wheezing or shortness of breath.  03/28/16  Yes Tresa Garter, MD  esomeprazole (NEXIUM) 40 MG capsule Take 1 capsule (40 mg total) by mouth daily. Patient taking differently: Take 40 mg by mouth daily before supper.  03/22/16  Yes Leo Grosser, MD  fluticasone (FLONASE) 50 MCG/ACT nasal spray Place 2 sprays into both nostrils daily. 12/27/15  Yes Elsie Stain, MD  ibuprofen (ADVIL,MOTRIN) 200 MG tablet Take 600 mg by mouth 2 (two) times daily as needed for moderate pain (knee pain).    Yes Historical Provider, MD  ipratropium (ATROVENT) 0.02 % nebulizer solution Take 2.5 mLs (0.5 mg total) by nebulization 4 (four) times daily as needed for wheezing or shortness of breath. 12/27/15  Yes Elsie Stain, MD  loratadine (CLARITIN) 10 MG tablet Take 1 tablet (10 mg total) by mouth daily. 08/30/15  Yes [name], MD  mometasone-formoterol [facility]) 200-5 MCG/ACT AERO Inhale 2 puffs into the lungs 2 (two) times daily.   Yes Historical Provider, MD  nicotine (NICODERM CQ - DOSED IN MG/24 HOURS) 14 mg/24hr patch Place 14 mg onto the skin 2 (two) times a week. Wednesdays and Sundays   Yes Historical Provider, MD  Pseudoeph-Doxylamine-DM-APAP (NYQUIL PO) Take 15 mLs by mouth at bedtime as needed (for cough).    Yes Historical Provider, MD  sertraline (ZOLOFT) 50 MG tablet Take 1 tablet (50 mg total) by mouth daily. 08/30/15  Yes [name], MD  amLODipine (NORVASC) 10 MG tablet Take 1 tablet (10 mg total) by mouth daily. 05/03/16   Josalyn Funches, MD    budesonide-formoterol (SYMBICORT) 160-4.5 MCG/ACT inhaler Inhale 2 puffs into the lungs 2 (two) times daily. Patient not taking: Reported on 05/05/2016 04/18/16 04/18/17  Tresa Garter, MD  HYDROcodone-acetaminophen (NORCO) 5-325 MG tablet Take 1-2 tablets by mouth every 4 (four) hours as needed. Patient not taking: Reported on 05/05/2016 03/13/16   Sherwood Gambler, MD  naproxen (NAPROSYN) 375 MG tablet Take 1 tablet (375 mg total) by mouth 2 (two) times daily. Patient not taking: Reported on 05/05/2016 03/11/16   Janne Napoleon, NP  potassium chloride SA (K-DUR,KLOR-CON) 20 MEQ tablet Take 2 tablets (40 mEq total) by mouth daily. 05/03/16   Boykin Nearing, MD  spironolactone (ALDACTONE) 50 MG tablet Take 1 tablet (50 mg total) by mouth daily. 05/03/16   Boykin Nearing, MD  Tiotropium Bromide Monohydrate (SPIRIVA RESPIMAT) 2.5 MCG/ACT AERS Inhale 2 puffs into the lungs daily. Patient not taking: Reported on 05/05/2016 12/27/15   Elsie Stain, MD  traMADol (ULTRAM) 50 MG tablet Take 1 tablet (50 mg total) by mouth every 8 (eight) hours as needed. Patient not taking: Reported on 05/05/2016 03/27/16   Brayton Caves, PA-C  valACYclovir (VALTREX) 1000 MG tablet Take 2 tablets (2,000 mg total) by mouth 2 (two) times daily. 05/03/16 05/05/16  Boykin Nearing, MD    Physical Exam: Vitals:   05/05/16 1813 05/05/16 1820 05/05/16 1915 05/05/16 2026  BP:  176/96 (!) 166/145 (!) 182/106  Pulse:  94 (!) 133 98  Resp:  25 (!) 35 (!) 22  Temp:    98.6 F (37 C)  TempSrc:    Oral  SpO2: 100% 100% 100% 98%     General:  Appears calm and comfortable and is NAD, on continuous neb treatment Eyes:  PERRL, EOMI, normal lids, iris ENT:  grossly normal hearing, lips & tongue, mmm Neck:  no LAD, masses or thyromegaly Cardiovascular:  RRR, no m/r/g. No LE edema.  Respiratory:  Diffuse wheezing, poor to moderate air movement, no currently increased WOB while on continuous neb Abdomen:  soft, ntnd, NABS, morbidly  obese Skin:  no rash or induration seen on limited exam Musculoskeletal:  grossly normal tone BUE/BLE, good ROM, no bony abnormality Psychiatric:  grossly normal mood and affect, speech fluent and appropriate, AOx3 Neurologic:  CN 2-12 grossly intact, moves all extremities in coordinated fashion, sensation intact  Labs on Admission: I have personally reviewed following labs and imaging studies  CBC:  Recent Labs Lab 04/30/16 1355 05/05/16 1602  WBC 16.4* 19.1*  HGB 12.4 11.1*  HCT 40.2 35.9*  MCV 85.9 84.1  PLT 413* [id number]*   Basic Metabolic Panel:  Recent Labs Lab 04/30/16 1355 05/05/16 1602  NA 138 139  K 2.8* 2.8*  CL 103 104  CO2 28 26  GLUCOSE 111* 122*  BUN <5* 6  CREATININE 0.72 0.70  CALCIUM 8.4* 8.6*   GFR: Estimated Creatinine Clearance: 141.1 mL/min (by C-G formula based on SCr of 0.7 mg/dL). Liver Function Tests: No results for input(s): AST, ALT, ALKPHOS, BILITOT, PROT, ALBUMIN in the last 168 hours. No results for input(s): LIPASE, AMYLASE in the last 168 hours. No results for input(s): AMMONIA in the last 168 hours. Coagulation Profile: No results for input(s): INR, PROTIME in the last 168 hours. Cardiac Enzymes: No results for input(s): CKTOTAL, CKMB, CKMBINDEX, TROPONINI in the last 168 hours. BNP (last 3 results) No results for input(s): PROBNP in the last 8760 hours. HbA1C:  Recent Labs  05/03/16 1610  HGBA1C 6.4   CBG: No results for input(s): GLUCAP in the last 168 hours. Lipid Profile: No results for input(s): CHOL, HDL, LDLCALC, TRIG, CHOLHDL, LDLDIRECT in the last 72 hours. Thyroid Function Tests: No results for input(s): TSH, T4TOTAL, FREET4, T3FREE, THYROIDAB in the last 72 hours. Anemia Panel: No results for input(s): VITAMINB12, FOLATE, FERRITIN, TIBC, IRON, RETICCTPCT in the last 72 hours. Urine analysis:    Component Value Date/Time   COLORURINE YELLOW 11/21/2014 0707   APPEARANCEUR CLOUDY (A) 11/21/2014 0707   LABSPEC  1.024 11/21/2014 0707   PHURINE 6.0 11/21/2014 0707   GLUCOSEU NEGATIVE 11/21/2014 0707   GLUCOSEU NEG mg/dL 10/28/2007 2049   HGBUR TRACE (A) 11/21/2014 0707   BILIRUBINUR NEGATIVE 11/21/2014 0707   KETONESUR NEGATIVE 11/21/2014 0707   PROTEINUR NEGATIVE 11/21/2014 [id number]  UROBILINOGEN 1.0 11/21/2014 0707   NITRITE NEGATIVE 11/21/2014 0707   LEUKOCYTESUR NEGATIVE 11/21/2014 0707    Creatinine Clearance: Estimated Creatinine Clearance: 141.1 mL/min (by C-G formula based on SCr of 0.7 mg/dL).  Sepsis Labs: @LABRCNTIP (procalcitonin:4,lacticidven:4) )No results found for this or any previous visit (from the past 240 hour(s)).   Radiological Exams on Admission: Dg Chest 2 View  Result Date: 05/05/2016 CLINICAL DATA:  46 year old presenting with 1 week history of wheezing that is not being treated with rescue inhaler or nebulizer treatments at home. Current history of asthma. EXAM: CHEST  2 VIEW COMPARISON:  04/30/2016, 03/22/2016 and earlier. FINDINGS: Cardiomediastinal silhouette unremarkable, unchanged. Mild central peribronchial thickening, similar to the prior examinations. Lungs otherwise clear. No localized airspace consolidation. No pleural effusions. No pneumothorax. Normal pulmonary vascularity. Visualized bony thorax intact. IMPRESSION: Stable mild changes of chronic bronchitis and/or asthma. No acute cardiopulmonary disease. Electronically Signed   By: Evangeline Dakin M.D.   On: 05/05/2016 17:01    EKG: not done  Assessment/Plan Principal Problem:   Asthma exacerbation Active Problems:   Morbid obesity with body mass index of 50.0-59.9 in adult (HCC)   Hypokalemia   Elevated hemoglobin A1c   Acute respiratory failure from chronic, poorly controlled severe persistent asthma -She has history of severe asthma without history of intubation but with multiple hospital admissions due to noncompliance to her medications and intermittent continued tobacco abuse. -Patient with 4  prior admissions and 3 additional ER visits for asthma exacerbations in the last 6 months.   -She blames lack of recent Xolair due to patient assistance program lapse. -Morbid obesity, intermittent tobacco use are also clear factors. -Has a pulmonologist, would likely benefit from revisit as an outpatient; if she is not improved tomorrow, would consider inpatient consultation. -Will place care management consult for consideration of [facility] enrollment, consideration of other programs that may decrease her ER utilization as well as improve her overall health. -CXR negative for PNA; no other apparent viral infection; elevated WBC is likely related to acute stress response and chronic intermittent steroid use. -will observe patient overnight  -Nebulizers: prn albuterol and atrovent -Solu-Medrol 80 mg IV BID - would suggest long-term taper vs. Consideration of low-dose daily steroids at least through the duration of the winter (although this is likely to negatively impact her weight, which may further worsen her breathing)  -No antibiotics at this time  -Received magnesium in the ER as well as continuous neb -Anticipate d/c to home tomorrow once she responds to active treatments  Hypokalemia -K 2.8 -May be related to use of spironolactone - suggest consideration of whether this medication is truly needed Received Magnesium in the ER so there is no need to now check Mag level - but consider this as an outpatient since her K+ was also 2.8 on 1/2 -Treated with 16mEq KCl PO x 1 in the ER -Will give an additional 40 mEq PO dose now and recheck K+ in the AM  Elevated A1c -Glucose 122, A1c 6.4 -Likely due to glucose intolerance as well as steroid use -Would suggest consideration of Metformin as an outpatient, as this may also help with weight loss  DVT prophylaxis:  Lovenox Code Status: Full  Family Communication: None present Disposition Plan: Home once clinically improved Consults called: None   Admission status: It is my clinical opinion that referral for OBSERVATION is reasonable and necessary in this patient based on the above information provided. The aforementioned taken together are felt to place the patient at high  risk for further clinical deterioration. However it is anticipated that the patient may be medically stable for discharge from the hospital within 24 to 48 hours.    [name] MD [facility]  If 7PM-7AM, please contact night-coverage [email or web address] Password [email or web address]  05/05/2016, 9:06 PM

## 2016-05-05 NOTE — ED Triage Notes (Signed)
Pt complaining of asthma x 1 week. Pt states no relief from rescue inhaler or neb treatment at home. Pt with bilateral wheezing at triage.

## 2016-05-05 NOTE — ED Notes (Signed)
Attempted to call report x 1  

## 2016-05-05 NOTE — Assessment & Plan Note (Signed)
Recent exacerbation Continue prednisone taper Patient attempting to restart xolair

## 2016-05-05 NOTE — Progress Notes (Signed)
Admission notified for Pt. arrival to the floor.

## 2016-05-06 DIAGNOSIS — E7439 Other disorders of intestinal carbohydrate absorption: Secondary | ICD-10-CM | POA: Diagnosis present

## 2016-05-06 DIAGNOSIS — J4551 Severe persistent asthma with (acute) exacerbation: Principal | ICD-10-CM

## 2016-05-06 DIAGNOSIS — Z87891 Personal history of nicotine dependence: Secondary | ICD-10-CM | POA: Diagnosis not present

## 2016-05-06 DIAGNOSIS — Z8249 Family history of ischemic heart disease and other diseases of the circulatory system: Secondary | ICD-10-CM | POA: Diagnosis not present

## 2016-05-06 DIAGNOSIS — E662 Morbid (severe) obesity with alveolar hypoventilation: Secondary | ICD-10-CM | POA: Diagnosis present

## 2016-05-06 DIAGNOSIS — E876 Hypokalemia: Secondary | ICD-10-CM | POA: Diagnosis present

## 2016-05-06 DIAGNOSIS — K219 Gastro-esophageal reflux disease without esophagitis: Secondary | ICD-10-CM | POA: Diagnosis present

## 2016-05-06 DIAGNOSIS — I10 Essential (primary) hypertension: Secondary | ICD-10-CM | POA: Diagnosis present

## 2016-05-06 DIAGNOSIS — Z7951 Long term (current) use of inhaled steroids: Secondary | ICD-10-CM | POA: Diagnosis not present

## 2016-05-06 DIAGNOSIS — J449 Chronic obstructive pulmonary disease, unspecified: Secondary | ICD-10-CM | POA: Diagnosis present

## 2016-05-06 DIAGNOSIS — Z825 Family history of asthma and other chronic lower respiratory diseases: Secondary | ICD-10-CM | POA: Diagnosis not present

## 2016-05-06 DIAGNOSIS — Z809 Family history of malignant neoplasm, unspecified: Secondary | ICD-10-CM | POA: Diagnosis not present

## 2016-05-06 DIAGNOSIS — R0602 Shortness of breath: Secondary | ICD-10-CM | POA: Diagnosis not present

## 2016-05-06 DIAGNOSIS — Z6841 Body Mass Index (BMI) 40.0 and over, adult: Secondary | ICD-10-CM | POA: Diagnosis not present

## 2016-05-06 LAB — RAPID URINE DRUG SCREEN, HOSP PERFORMED
AMPHETAMINES: NOT DETECTED
Barbiturates: NOT DETECTED
Benzodiazepines: NOT DETECTED
COCAINE: NOT DETECTED
OPIATES: NOT DETECTED
Tetrahydrocannabinol: NOT DETECTED

## 2016-05-06 LAB — CBC
HEMATOCRIT: 37.1 % (ref 36.0–46.0)
HEMOGLOBIN: 11.2 g/dL — AB (ref 12.0–15.0)
MCH: 26 pg (ref 26.0–34.0)
MCHC: 30.2 g/dL (ref 30.0–36.0)
MCV: 86.3 fL (ref 78.0–100.0)
Platelets: 396 10*3/uL (ref 150–400)
RBC: 4.3 MIL/uL (ref 3.87–5.11)
RDW: 17.2 % — ABNORMAL HIGH (ref 11.5–15.5)
WBC: 19 10*3/uL — ABNORMAL HIGH (ref 4.0–10.5)

## 2016-05-06 LAB — BASIC METABOLIC PANEL
Anion gap: 10 (ref 5–15)
BUN: 7 mg/dL (ref 6–20)
CALCIUM: 8.4 mg/dL — AB (ref 8.9–10.3)
CO2: 24 mmol/L (ref 22–32)
CREATININE: 0.75 mg/dL (ref 0.44–1.00)
Chloride: 104 mmol/L (ref 101–111)
GFR calc Af Amer: >60 mL/min (ref >60)
GFR calc non Af Amer: >60 mL/min (ref >60)
GLUCOSE: 232 mg/dL — AB (ref 65–99)
Potassium: 3.4 mmol/L — ABNORMAL LOW (ref 3.5–5.1)
Sodium: 138 mmol/L (ref 135–145)

## 2016-05-06 LAB — MAGNESIUM: Magnesium: 2.1 mg/dL (ref 1.7–2.4)

## 2016-05-06 MED ORDER — BENZONATATE 100 MG PO CAPS
100.0000 mg | ORAL_CAPSULE | Freq: Three times a day (TID) | ORAL | Status: DC | PRN
  Administered 2016-05-07: 100 mg via ORAL
  Filled 2016-05-06: qty 1

## 2016-05-06 MED ORDER — GUAIFENESIN ER 600 MG PO TB12
1200.0000 mg | ORAL_TABLET | Freq: Two times a day (BID) | ORAL | Status: DC
  Administered 2016-05-06 – 2016-05-07 (×2): 1200 mg via ORAL
  Filled 2016-05-06 (×3): qty 2

## 2016-05-06 MED ORDER — ZOLPIDEM TARTRATE 5 MG PO TABS
5.0000 mg | ORAL_TABLET | Freq: Every evening | ORAL | Status: DC | PRN
  Administered 2016-05-06 (×2): 5 mg via ORAL
  Filled 2016-05-06 (×2): qty 1

## 2016-05-06 MED ORDER — POTASSIUM CHLORIDE CRYS ER 20 MEQ PO TBCR: 40.0000 meq | EXTENDED_RELEASE_TABLET | Freq: Once | ORAL | Status: DC

## 2016-05-06 MED ORDER — METHYLPREDNISOLONE SODIUM SUCC 125 MG IJ SOLR
60.0000 mg | Freq: Two times a day (BID) | INTRAMUSCULAR | Status: DC
  Administered 2016-05-06 – 2016-05-07 (×3): 60 mg via INTRAVENOUS
  Filled 2016-05-06 (×3): qty 2

## 2016-05-06 MED ORDER — ALBUTEROL SULFATE (2.5 MG/3ML) 0.083% IN NEBU
2.5000 mg | INHALATION_SOLUTION | Freq: Four times a day (QID) | RESPIRATORY_TRACT | Status: DC | PRN
  Administered 2016-05-07 (×2): 2.5 mg via RESPIRATORY_TRACT
  Filled 2016-05-06 (×2): qty 3

## 2016-05-06 MED ORDER — GUAIFENESIN-DM 100-10 MG/5ML PO SYRP
5.0000 mL | ORAL_SOLUTION | ORAL | Status: DC | PRN
  Administered 2016-05-06 – 2016-05-07 (×5): 5 mL via ORAL
  Filled 2016-05-06 (×5): qty 5

## 2016-05-06 MED ORDER — IPRATROPIUM BROMIDE 0.02 % IN SOLN: 0.5000 mg | RESPIRATORY_TRACT | Status: DC

## 2016-05-06 MED ORDER — HYDRALAZINE HCL 20 MG/ML IJ SOLN
10.0000 mg | Freq: Four times a day (QID) | INTRAMUSCULAR | Status: DC | PRN
  Filled 2016-05-06: qty 1

## 2016-05-06 MED ORDER — IPRATROPIUM-ALBUTEROL 0.5-2.5 (3) MG/3ML IN SOLN
3.0000 mL | Freq: Three times a day (TID) | RESPIRATORY_TRACT | Status: DC
  Administered 2016-05-06 – 2016-05-07 (×3): 3 mL via RESPIRATORY_TRACT
  Filled 2016-05-06 (×3): qty 3

## 2016-05-06 NOTE — Progress Notes (Signed)
PROGRESS NOTE    April [name]  [id number] DOB: [birth year] DOA: 05/05/2016 PCP: [name], MD   Outpatient Specialists:     Brief Narrative:  April [name] is a 46 y.o. female with medical history significant of morbid obesity, ongoing intermittent tobacco use, severe persistent asthma, HTN, and depression presenting with an asthma exacerbation.  Had symptoms before Christmas, got better and then came back.  Symptoms resumed about 1 week ago, right before New Year's.  Has treated with home prednisone 40mg  daily x 3-4 days (leftover pills) and neb treatments.  Wheezing wouldn't stop today and so decided to come in to ER again (was also seen Thursday - given hour-long treatment and give Prednisone taper).  No fevers.  No sick contacts.  Denies smoking, but then reports that it "comes and goes."  Is requesting a patch.  Last cigarette was maybe a month ago?  Has been hospitalized multiple times for asthma in the past, no intubations.  ER visits/hospitalizations in the last 6 months include: 7/14-15 - hospitalization for asthma exacerbation and GERD 7/27 - Urgent Care visit 7/30-31 - hospitalization 8/21 - ER visit 8/22-24 - hospitalization 10/3-4 - hospitalization 11/1 - ER visit 11/13 - ER visit for a fall 11/15 - ER visit for knee pain 11/18 - ER visit for knee pain 11/24 - ER visit for asthma exacerbation 1/2 - ER visit for asthma and knee pain 1/5 - PCP visit for follow-up     Assessment & Plan:   Principal Problem:   Asthma exacerbation Active Problems:   Morbid obesity with body mass index of 50.0-59.9 in adult (HCC)   Hypokalemia   Elevated hemoglobin A1c   Acute respiratory failure from chronic, poorly controlled severe persistent asthma -She has history of severe asthma without history of intubation but with multiple hospital admissions due to noncompliance to her medications and intermittent continued tobacco abuse. -Patient with 4 prior admissions and 3  additional ER visits for asthma exacerbations in the last 6 months.   -She blames lack of recent Xolair due to patient assistance program lapse. -Morbid obesity, intermittent tobacco use are also clear factors. -Has a pulmonologist, would likely benefit from revisit as an outpatient -CXR negative for PNA; no other apparent viral infection; elevated WBC is likely related to acute stress response and chronic intermittent steroid use. -Nebulizers: prn albuterol and scheduled atrovent -Solu-Medrol 80 mg IV BID - would suggest long-term taper  -No antibiotics at this time    Hypokalemia -K 2.8 -Mg ok -replete to 4  Elevated A1c -Glucose 122, A1c 6.4 -start metformin as outpatient  HTN Hydralazine PRN  DVT prophylaxis:  SCD's  Code Status: Full Code   Family Communication:   Disposition Plan:     Consultants:    Subjective: + cough and shortness of breath  Objective: Vitals:   05/05/16 2026 05/06/16 0202 05/06/16 0208 05/06/16 0925  BP: (!) 182/106 (!) 188/102 (!) 180/99   Pulse: 98     Resp: (!) 22     Temp: 98.6 F (37 C)     TempSrc: Oral     SpO2: 98%   96%    Intake/Output Summary (Last 24 hours) at 05/06/16 1141 Last data filed at 05/05/16 1934  Gross per 24 hour  Intake               50 ml  Output                0 ml  Net  50 ml   There were no vitals filed for this visit.  Examination:  General exam: Appears calm and comfortable  Respiratory system: expiratory wheezing Cardiovascular system: S1 & S2 heard, RRR. No JVD, murmurs, rubs, gallops or clicks. No pedal edema. Gastrointestinal system: Abdomen is nondistended, soft and nontender. No organomegaly or masses felt. Normal bowel sounds heard. Central nervous system: Alert and oriented. No focal neurological deficits.     Data Reviewed: I have personally reviewed following labs and imaging studies  CBC:  Recent Labs Lab 04/30/16 1355 05/05/16 1602 05/06/16 0900  WBC  16.4* 19.1* 19.0*  HGB 12.4 11.1* 11.2*  HCT 40.2 35.9* 37.1  MCV 85.9 84.1 86.3  PLT 413* 424* [id number]   Basic Metabolic Panel:  Recent Labs Lab 04/30/16 1355 05/05/16 1602 05/06/16 0900  NA 138 139 138  K 2.8* 2.8* 3.4*  CL 103 104 104  CO2 28 26 24   GLUCOSE 111* 122* 232*  BUN <5* 6 7  CREATININE 0.72 0.70 0.75  CALCIUM 8.4* 8.6* 8.4*  MG  --   --  2.1   GFR: Estimated Creatinine Clearance: 141.1 mL/min (by C-G formula based on SCr of 0.75 mg/dL). Liver Function Tests: No results for input(s): AST, ALT, ALKPHOS, BILITOT, PROT, ALBUMIN in the last 168 hours. No results for input(s): LIPASE, AMYLASE in the last 168 hours. No results for input(s): AMMONIA in the last 168 hours. Coagulation Profile: No results for input(s): INR, PROTIME in the last 168 hours. Cardiac Enzymes: No results for input(s): CKTOTAL, CKMB, CKMBINDEX, TROPONINI in the last 168 hours. BNP (last 3 results) No results for input(s): PROBNP in the last 8760 hours. HbA1C:  Recent Labs  05/03/16 1610  HGBA1C 6.4   CBG: No results for input(s): GLUCAP in the last 168 hours. Lipid Profile: No results for input(s): CHOL, HDL, LDLCALC, TRIG, CHOLHDL, LDLDIRECT in the last 72 hours. Thyroid Function Tests: No results for input(s): TSH, T4TOTAL, FREET4, T3FREE, THYROIDAB in the last 72 hours. Anemia Panel: No results for input(s): VITAMINB12, FOLATE, FERRITIN, TIBC, IRON, RETICCTPCT in the last 72 hours. Urine analysis:    Component Value Date/Time   COLORURINE YELLOW 11/21/2014 0707   APPEARANCEUR CLOUDY (A) 11/21/2014 0707   LABSPEC 1.024 11/21/2014 0707   PHURINE 6.0 11/21/2014 0707   GLUCOSEU NEGATIVE 11/21/2014 0707   GLUCOSEU NEG mg/dL 10/28/2007 2049   HGBUR TRACE (A) 11/21/2014 0707   BILIRUBINUR NEGATIVE 11/21/2014 0707   KETONESUR NEGATIVE 11/21/2014 0707   PROTEINUR NEGATIVE 11/21/2014 0707   UROBILINOGEN 1.0 11/21/2014 0707   NITRITE NEGATIVE 11/21/2014 0707   LEUKOCYTESUR NEGATIVE  11/21/2014 0707     )No results found for this or any previous visit (from the past 240 hour(s)).    Anti-infectives    Start     Dose/Rate Route Frequency Ordered Stop   05/05/16 2200  valACYclovir (VALTREX) tablet 2,000 mg     2,000 mg Oral 2 times daily 05/05/16 2044         Radiology Studies: Dg Chest 2 View  Result Date: 05/05/2016 CLINICAL DATA:  46 year old presenting with 1 week history of wheezing that is not being treated with rescue inhaler or nebulizer treatments at home. Current history of asthma. EXAM: CHEST  2 VIEW COMPARISON:  04/30/2016, 03/22/2016 and earlier. FINDINGS: Cardiomediastinal silhouette unremarkable, unchanged. Mild central peribronchial thickening, similar to the prior examinations. Lungs otherwise clear. No localized airspace consolidation. No pleural effusions. No pneumothorax. Normal pulmonary vascularity. Visualized bony thorax intact. IMPRESSION: Stable mild changes  of chronic bronchitis and/or asthma. No acute cardiopulmonary disease. Electronically Signed   By: Evangeline Dakin M.D.   On: 05/05/2016 17:01        Scheduled Meds: . amLODipine  10 mg Oral Daily  . enoxaparin (LOVENOX) injection  40 mg Subcutaneous Q24H  . fluticasone  2 spray Each Nare Daily  . ipratropium-albuterol  3 mL Nebulization TID  . loratadine  10 mg Oral Daily  . methylPREDNISolone (SOLU-MEDROL) injection  60 mg Intravenous Q12H  . mometasone-formoterol  2 puff Inhalation BID  . nicotine  14 mg Transdermal Daily  . pantoprazole  40 mg Oral Daily  . potassium chloride  40 mEq Oral Once  . sertraline  50 mg Oral Daily  . valACYclovir  2,000 mg Oral BID   Continuous Infusions:   LOS: 0 days    Time spent: 35 min    [place], DO [facility] Pager [phone]  If 7PM-7AM, please contact night-coverage [email or web address] Password [email or web address] 05/06/2016, 11:41 AM

## 2016-05-06 NOTE — Progress Notes (Signed)
Nutrition Brief Note  RD consulted for assessment of nutrition requirements/status.  Wt Readings from Last 15 Encounters:  05/03/16 (!) 347 lb 6.4 oz (157.6 kg)  04/30/16 300 lb (136.1 kg)  03/27/16 (!) 352 lb (159.7 kg)  03/13/16 (!) 310 lb (140.6 kg)  02/28/16 (!) 310 lb (140.6 kg)  01/30/16 (!) 310 lb (140.6 kg)  12/27/15 (!) 349 lb (158.3 kg)  12/26/15 (!) 345 lb (156.5 kg)  12/19/15 (!) 353 lb 6.4 oz (160.3 kg)  12/18/15 (!) 320 lb (145.2 kg)  11/27/15 (!) 337 lb 8.4 oz (153.1 kg)  11/10/15 (!) 337 lb 8.4 oz (153.1 kg)  10/24/15 (!) 329 lb (149.2 kg)  08/30/15 (!) 333 lb 4.8 oz (151.2 kg)  08/21/15 (!) 334 lb 14.4 oz (151.9 kg)   Current diet order is regular. No recent percent meal completion recorded, however it reports 100% at breakfast this AM. Pt reports eating well currently and PTA with usual consumption of at least 2-3 meals a day with no other difficulties. Pt reports no significant weight changes recently. Labs and medications reviewed.   No nutrition interventions warranted at this time. If nutrition issues arise, please consult RD.   Corrin Parker, MS, RD, LDN Pager # [phone] After hours/ weekend pager # [phone]

## 2016-05-07 MED ORDER — TIOTROPIUM BROMIDE MONOHYDRATE 2.5 MCG/ACT IN AERS: 2.0000 | INHALATION_SPRAY | Freq: Every day | RESPIRATORY_TRACT | 0 refills | Status: DC

## 2016-05-07 MED ORDER — GUAIFENESIN ER 600 MG PO TB12: 1200.0000 mg | ORAL_TABLET | Freq: Two times a day (BID) | ORAL | 0 refills | Status: DC

## 2016-05-07 MED ORDER — PREDNISONE 10 MG PO TABS: ORAL_TABLET | ORAL | 0 refills | Status: DC

## 2016-05-07 MED ORDER — BENZONATATE 100 MG PO CAPS: 100.0000 mg | ORAL_CAPSULE | Freq: Three times a day (TID) | ORAL | 0 refills | Status: DC | PRN

## 2016-05-07 MED ORDER — METFORMIN HCL 500 MG PO TABS: 500.0000 mg | ORAL_TABLET | Freq: Two times a day (BID) | ORAL | 0 refills | Status: DC

## 2016-05-07 MED FILL — ?PREDNISONE 10 MG TABLET: 10 | 12 days supply | Qty: 42 | Fill #0

## 2016-05-07 MED FILL — metFORMIN HCL 500 MG TABS: 500 | 30 days supply | Qty: 60 | Fill #0

## 2016-05-07 MED FILL — BENZONATATE 100 MG CAPSULE: 100 | 6 days supply | Qty: 20 | Fill #0

## 2016-05-07 NOTE — Telephone Encounter (Signed)
#   Vials:4 Arrival Date:05/07/16 Lot [id number] Exp Date:6/21

## 2016-05-07 NOTE — Progress Notes (Signed)
Discharge instructions given. Pt verbalized understanding and all questions were answered.  

## 2016-05-07 NOTE — Discharge Summary (Signed)
Physician Discharge Summary  April [name] [id number] DOB: Apr 29, 1971 DOA: 05/05/2016  PCP: [name], MD  Admit date: 05/05/2016 Discharge date: 05/07/2016   Recommendations for Outpatient Follow-Up:   Tobacco cessation Weight loss Compliance with medications Trend CBC as elevated from possible steroids inpatient   Discharge Diagnosis:   Principal Problem:   Asthma exacerbation Active Problems:   Morbid obesity with body mass index of 50.0-59.9 in adult [facility])   Hypokalemia   Elevated hemoglobin A1c   Discharge disposition:  Home.  Discharge Condition: Improved.  Diet recommendation: Low sodium, heart healthy.  Carbohydrate-modified  Wound care: None.   History of Present Illness:   April [name] is a 46 y.o. female with medical history significant of morbid obesity, ongoing intermittent tobacco use, severe persistent asthma, HTN, and depression presenting with an asthma exacerbation.  Had symptoms before Christmas, got better and then came back.  Symptoms resumed about 1 week ago, right before New Year's.  Has treated with home prednisone 40mg  daily x 3-4 days (leftover pills) and neb treatments.  Wheezing wouldn't stop today and so decided to come in to ER again (was also seen Thursday - given hour-long treatment and give Prednisone taper).  No fevers.  No sick contacts.   Hospital Course by Problem:   Acute respiratory failure from chronic, poorly controlled severe persistent asthma -She has history of severe asthma withouthistory of intubation but with multiple hospital admissions due to noncompliance to her medications and intermittent continued tobacco abuse. -Patient with 4 prior admissions and 3 additional ER visits for asthma exacerbations in the last 6 months.  -She blames lack of recent Xolair due to patient assistance program lapse. -Morbid obesity, intermittent tobacco use are also clear factors. -follow up with pulm -CXR negative for PNA; no  other apparent viral infection; elevated WBC is likely related to acute stress response and chronic intermittent steroid use. -Nebulizers -long steroid taper    Hypokalemia -Mg ok -repleted   Elevated A1c -Glucose 122, A1c 6.4 -start metformin upon d/c  HTN Resume home meds Outpatient titration    Medical Consultants:    None.   Discharge Exam:   Vitals:   05/06/16 2139 05/07/16 0425  BP: (!) 174/78 (!) 164/81  Pulse: (!) 103 98  Resp: 20 20  Temp: 98.8 F (37.1 C) 98.5 F (36.9 C)   Vitals:   05/06/16 1622 05/06/16 2139 05/06/16 2156 05/07/16 0425  BP: (!) 163/75 (!) 174/78  (!) 164/81  Pulse: (!) 102 (!) 103  98  Resp: 20 20  20   Temp:  98.8 F (37.1 C)  98.5 F (36.9 C)  TempSrc:  Oral  Oral  SpO2:  100% 99% 99%    Gen:  NAD- dressed and in chair-- feeling better  The results of significant diagnostics from this hospitalization (including imaging, microbiology, ancillary and laboratory) are listed below for reference.     Procedures and Diagnostic Studies:   Dg Chest 2 View  Result Date: 05/05/2016 CLINICAL DATA:  46 year old presenting with 1 week history of wheezing that is not being treated with rescue inhaler or nebulizer treatments at home. Current history of asthma. EXAM: CHEST  2 VIEW COMPARISON:  04/30/2016, 03/22/2016 and earlier. FINDINGS: Cardiomediastinal silhouette unremarkable, unchanged. Mild central peribronchial thickening, similar to the prior examinations. Lungs otherwise clear. No localized airspace consolidation. No pleural effusions. No pneumothorax. Normal pulmonary vascularity. Visualized bony thorax intact. IMPRESSION: Stable mild changes of chronic bronchitis and/or asthma. No acute cardiopulmonary disease. Electronically  Signed   By: Evangeline Dakin M.D.   On: 05/05/2016 17:01     Labs:   Basic Metabolic Panel:  Recent Labs Lab 04/30/16 1355 05/05/16 1602 05/06/16 0900  NA 138 139 138  K 2.8* 2.8* 3.4*  CL 103  104 104  CO2 28 26 24   GLUCOSE 111* 122* 232*  BUN <5* 6 7  CREATININE 0.72 0.70 0.75  CALCIUM 8.4* 8.6* 8.4*  MG  --   --  2.1   GFR Estimated Creatinine Clearance: 141.1 mL/min (by C-G formula based on SCr of 0.75 mg/dL). Liver Function Tests: No results for input(s): AST, ALT, ALKPHOS, BILITOT, PROT, ALBUMIN in the last 168 hours. No results for input(s): LIPASE, AMYLASE in the last 168 hours. No results for input(s): AMMONIA in the last 168 hours. Coagulation profile No results for input(s): INR, PROTIME in the last 168 hours.  CBC:  Recent Labs Lab 04/30/16 1355 05/05/16 1602 05/06/16 0900  WBC 16.4* 19.1* 19.0*  HGB 12.4 11.1* 11.2*  HCT 40.2 35.9* 37.1  MCV 85.9 84.1 86.3  PLT 413* 424* 396   Cardiac Enzymes: No results for input(s): CKTOTAL, CKMB, CKMBINDEX, TROPONINI in the last 168 hours. BNP: Invalid input(s): POCBNP CBG: No results for input(s): GLUCAP in the last 168 hours. D-Dimer No results for input(s): DDIMER in the last 72 hours. Hgb A1c No results for input(s): HGBA1C in the last 72 hours. Lipid Profile No results for input(s): CHOL, HDL, LDLCALC, TRIG, CHOLHDL, LDLDIRECT in the last 72 hours. Thyroid function studies No results for input(s): TSH, T4TOTAL, T3FREE, THYROIDAB in the last 72 hours.  Invalid input(s): FREET3 Anemia work up No results for input(s): VITAMINB12, FOLATE, FERRITIN, TIBC, IRON, RETICCTPCT in the last 72 hours. Microbiology No results found for this or any previous visit (from the past 240 hour(s)).   Discharge Instructions:   Discharge Instructions    Diet Carb Modified    Complete by:  As directed    Discharge instructions    Complete by:  As directed    Stop smoking Be sure to take medications as directed and not to run out of meds pulm follow up  1 month   Increase activity slowly    Complete by:  As directed      Allergies as of 05/07/2016   No Known Allergies     Medication List    STOP taking these  medications   potassium chloride SA 20 MEQ tablet Commonly known as:  K-DUR,KLOR-CON   spironolactone 50 MG tablet Commonly known as:  ALDACTONE   valACYclovir 1000 MG tablet Commonly known as:  VALTREX     TAKE these medications   acetaminophen 500 MG tablet Commonly known as:  TYLENOL Take 2 tablets (1,000 mg total) by mouth every 6 (six) hours as needed.   albuterol (2.5 MG/3ML) 0.083% nebulizer solution Commonly known as:  PROVENTIL Take 3 mLs (2.5 mg total) by nebulization every 4 (four) hours as needed for wheezing or shortness of breath. What changed:  Another medication with the same name was changed. Make sure you understand how and when to take each.   albuterol 108 (90 Base) MCG/ACT inhaler Commonly known as:  VENTOLIN HFA INHALE 1-2 PUFFS INTO THE LUNGS EVERY 4 HOURS as needed What changed:  how much to take  how to take this  when to take this  reasons to take this  additional instructions   amLODipine 10 MG tablet Commonly known as:  NORVASC Take 1 tablet (  10 mg total) by mouth daily.   benzonatate 100 MG capsule Commonly known as:  TESSALON Take 1 capsule (100 mg total) by mouth 3 (three) times daily as needed for cough.   DULERA 200-5 MCG/ACT Aero Generic drug:  mometasone-formoterol Inhale 2 puffs into the lungs 2 (two) times daily.   esomeprazole 40 MG capsule Commonly known as:  NEXIUM Take 1 capsule (40 mg total) by mouth daily. What changed:  when to take this   fluticasone 50 MCG/ACT nasal spray Commonly known as:  FLONASE Place 2 sprays into both nostrils daily.   guaiFENesin 600 MG 12 hr tablet Commonly known as:  MUCINEX Take 2 tablets (1,200 mg total) by mouth 2 (two) times daily.   ibuprofen 200 MG tablet Commonly known as:  ADVIL,MOTRIN Take 600 mg by mouth 2 (two) times daily as needed for moderate pain (knee pain).   ipratropium 0.02 % nebulizer solution Commonly known as:  ATROVENT Take 2.5 mLs (0.5 mg total) by  nebulization 4 (four) times daily as needed for wheezing or shortness of breath.   loratadine 10 MG tablet Commonly known as:  CLARITIN Take 1 tablet (10 mg total) by mouth daily.   metFORMIN 500 MG tablet Commonly known as:  GLUCOPHAGE Take 1 tablet (500 mg total) by mouth 2 (two) times daily with a meal.   nicotine 14 mg/24hr patch Commonly known as:  NICODERM CQ - dosed in mg/24 hours Place 14 mg onto the skin 2 (two) times a week. Wednesdays and Sundays   predniSONE 10 MG tablet Commonly known as:  DELTASONE 60 mg x 2 days then 50 mg x 2 days then 40 mg x 2 days then 30 mg x2 days then 20 mg x 2 days then 10 mg x2 days and stop   sertraline 50 MG tablet Commonly known as:  ZOLOFT Take 1 tablet (50 mg total) by mouth daily.   Tiotropium Bromide Monohydrate 2.5 MCG/ACT Aers Commonly known as:  SPIRIVA RESPIMAT Inhale 2 puffs into the lungs daily.      Follow-up Information    [name], MD Follow up in 1 week(s).   Specialty:  Family Medicine Contact information: [place] [address] [phone]        Deneise Lever, MD Follow up in 1 month(s).   Specialty:  Pulmonary Disease Contact information: [place] [address] [phone]            Time coordinating discharge: 35 min  Signed:  Alizee Maple U Demarques Pilz   [facility] 05/07/2016, 9:30 AM

## 2016-05-08 ENCOUNTER — Encounter (HOSPITAL_COMMUNITY): Payer: Self-pay

## 2016-05-08 DIAGNOSIS — R0602 Shortness of breath: Secondary | ICD-10-CM | POA: Diagnosis present

## 2016-05-08 DIAGNOSIS — Z5321 Procedure and treatment not carried out due to patient leaving prior to being seen by health care provider: Secondary | ICD-10-CM | POA: Diagnosis not present

## 2016-05-08 MED ORDER — ALBUTEROL SULFATE (2.5 MG/3ML) 0.083% IN NEBU
5.0000 mg | INHALATION_SOLUTION | Freq: Once | RESPIRATORY_TRACT | Status: AC
  Administered 2016-05-08: 5 mg via RESPIRATORY_TRACT

## 2016-05-08 MED ORDER — ALBUTEROL SULFATE (2.5 MG/3ML) 0.083% IN NEBU
INHALATION_SOLUTION | RESPIRATORY_TRACT | Status: AC
  Filled 2016-05-08: qty 3

## 2016-05-08 NOTE — ED Triage Notes (Signed)
Pt states that she was released from the hospital yesterday for asthma, pt is still wheezing and feeling tight. At home breathing treatments not working.

## 2016-05-09 ENCOUNTER — Encounter (HOSPITAL_COMMUNITY): Payer: Self-pay | Admitting: Emergency Medicine

## 2016-05-09 ENCOUNTER — Emergency Department (HOSPITAL_COMMUNITY)
Admission: EM | Admit: 2016-05-09 | Discharge: 2016-05-09 | Disposition: A | Discharge status: Home / Self Care | Payer: Medicaid Other | Attending: Emergency Medicine | Admitting: Emergency Medicine

## 2016-05-09 ENCOUNTER — Emergency Department (HOSPITAL_COMMUNITY): Payer: Medicaid Other

## 2016-05-09 ENCOUNTER — Ambulatory Visit (INDEPENDENT_AMBULATORY_CARE_PROVIDER_SITE_OTHER): Payer: Self-pay

## 2016-05-09 DIAGNOSIS — J4540 Moderate persistent asthma, uncomplicated: Secondary | ICD-10-CM

## 2016-05-09 DIAGNOSIS — R0602 Shortness of breath: Secondary | ICD-10-CM

## 2016-05-09 NOTE — ED Notes (Signed)
No answer x3

## 2016-05-09 NOTE — ED Provider Notes (Signed)
Pt eloped prior to my evaluation    Domenic Moras, PA-C 05/09/16 [facility], MD 05/09/16 [phone]

## 2016-05-10 MED ORDER — OMALIZUMAB 150 MG ~~LOC~~ SOLR
225.0000 mg | SUBCUTANEOUS | Status: DC
  Administered 2016-05-09: 225 mg via SUBCUTANEOUS

## 2016-05-16 ENCOUNTER — Ambulatory Visit: Payer: Self-pay | Admitting: Family Medicine

## 2016-05-21 ENCOUNTER — Encounter: Payer: Self-pay | Admitting: Family Medicine

## 2016-05-21 ENCOUNTER — Ambulatory Visit: Payer: Medicaid Other | Attending: Family Medicine | Admitting: Family Medicine

## 2016-05-21 VITALS — BP 171/82 | HR 93 | Temp 98.0°F | Ht 66.0 in | Wt 340.2 lb

## 2016-05-21 DIAGNOSIS — Z111 Encounter for screening for respiratory tuberculosis: Secondary | ICD-10-CM | POA: Insufficient documentation

## 2016-05-21 DIAGNOSIS — J449 Chronic obstructive pulmonary disease, unspecified: Secondary | ICD-10-CM

## 2016-05-21 DIAGNOSIS — I10 Essential (primary) hypertension: Secondary | ICD-10-CM | POA: Insufficient documentation

## 2016-05-21 DIAGNOSIS — F419 Anxiety disorder, unspecified: Secondary | ICD-10-CM | POA: Insufficient documentation

## 2016-05-21 DIAGNOSIS — Z79899 Other long term (current) drug therapy: Secondary | ICD-10-CM | POA: Diagnosis not present

## 2016-05-21 DIAGNOSIS — G4733 Obstructive sleep apnea (adult) (pediatric): Secondary | ICD-10-CM | POA: Diagnosis not present

## 2016-05-21 DIAGNOSIS — Z825 Family history of asthma and other chronic lower respiratory diseases: Secondary | ICD-10-CM | POA: Diagnosis not present

## 2016-05-21 DIAGNOSIS — F329 Major depressive disorder, single episode, unspecified: Secondary | ICD-10-CM | POA: Insufficient documentation

## 2016-05-21 DIAGNOSIS — F5101 Primary insomnia: Secondary | ICD-10-CM | POA: Insufficient documentation

## 2016-05-21 DIAGNOSIS — Z87891 Personal history of nicotine dependence: Secondary | ICD-10-CM | POA: Diagnosis not present

## 2016-05-21 DIAGNOSIS — E876 Hypokalemia: Secondary | ICD-10-CM | POA: Diagnosis not present

## 2016-05-21 DIAGNOSIS — G4700 Insomnia, unspecified: Secondary | ICD-10-CM | POA: Insufficient documentation

## 2016-05-21 DIAGNOSIS — K219 Gastro-esophageal reflux disease without esophagitis: Secondary | ICD-10-CM | POA: Insufficient documentation

## 2016-05-21 DIAGNOSIS — Z8249 Family history of ischemic heart disease and other diseases of the circulatory system: Secondary | ICD-10-CM | POA: Diagnosis not present

## 2016-05-21 DIAGNOSIS — Z6841 Body Mass Index (BMI) 40.0 and over, adult: Secondary | ICD-10-CM | POA: Insufficient documentation

## 2016-05-21 LAB — CBC
HCT: 38 % (ref 35.0–45.0)
Hemoglobin: 11.9 g/dL (ref 11.7–15.5)
MCH: 26 pg — ABNORMAL LOW (ref 27.0–33.0)
MCHC: 31.3 g/dL — AB (ref 32.0–36.0)
MCV: 83.2 fL (ref 80.0–100.0)
MPV: 9.4 fL (ref 7.5–12.5)
PLATELETS: 339 10*3/uL (ref 140–400)
RBC: 4.57 MIL/uL (ref 3.80–5.10)
RDW: 18 % — AB (ref 11.0–15.0)
WBC: 18.1 10*3/uL — ABNORMAL HIGH (ref 3.8–10.8)

## 2016-05-21 MED ORDER — TRAZODONE HCL 50 MG PO TABS: 25.0000 mg | ORAL_TABLET | Freq: Every evening | ORAL | 3 refills | Status: DC | PRN

## 2016-05-21 MED ORDER — PREDNISONE 20 MG PO TABS: ORAL_TABLET | ORAL | 0 refills | Status: DC

## 2016-05-21 MED ORDER — SPIRONOLACTONE 50 MG PO TABS: 100.0000 mg | ORAL_TABLET | Freq: Every day | ORAL | 2 refills | Status: DC

## 2016-05-21 MED FILL — $VENTOLIN HFA 18G INHALER: 108 (90 BAS | 16 days supply | Qty: 18 | Fill #2

## 2016-05-21 MED FILL — traZODone HCL 50 MG TABS: 50 | 30 days supply | Qty: 30 | Fill #0

## 2016-05-21 MED FILL — ALBUTEROL 0.083% INHAL SOLN: (2.5 MG/3ML | 10 days supply | Qty: 180 | Fill #5

## 2016-05-21 NOTE — Assessment & Plan Note (Signed)
Uncontrolled with chronic hypokalemia Increase aldactone to 100 mg daily Continue norvasc 10 mg daily Likely add ARB at next visit vs hydralazine

## 2016-05-21 NOTE — Progress Notes (Signed)
LOGO@  Subjective:  Patient ID: April [name], female    DOB: [birth year]  Age: 46 y.o. MRN: [id number]  CC: Hypertension and Asthma   HPI April [name] has morbid obesity, asthma, HTN, OSA, major depression and anxiety she presents for   1. CHRONIC HYPERTENSION since age 78   Disease Monitoring  Blood pressure range: not checking   Chest pain: no   Dyspnea: no   Claudication: no   Medication compliance: taking aldactone 50 mg daily. Taking amlodipine 10 mg daily.  Has chronically low potassium.   Medication Side Effects  Lightheadedness: no   Urinary frequency: no   Edema: yes    Preventitive Healthcare:  Exercise: no   Diet Pattern: high sugar   Salt Restriction: no    2. Hospital  f/u asthma: she has chronic asthma. Was hospitalized from 1/7-05/08/2015 for asthma exacerbation. Completed long taper of steroids. Recently restarted xolair. Having wheezing and shortness of breath but improved from hospitalization.   3. Trouble sleeping: chronic. Tried tylenol PM. Denies sleep apnea. Reports she had a negative sleep study some years ago. Admits to stress and depressed mood related to insecure job situation. Request PPD. Works as a [employer] manager. She has also tried [place], [place] and trazodone in the past. Ambien was expensive.   Social History  Substance Use Topics  . Smoking status: Former Smoker    Years: 18.00    Types: Cigarettes  . Smokeless tobacco: Never Used  . Alcohol use No    Past Medical History:  Diagnosis Date  . Acanthosis nigricans   . Arthritis   . Asthma    Followed by Dr. Annamaria Boots (pulmonology); receives every other week omalizumab injections; has frequent exacerbations  . COPD (chronic obstructive pulmonary disease) ([place])    PFTs in 2002, FEV1/FVC 65, no post bronchodilater test done  . Depression   . GERD (gastroesophageal reflux disease)   . Headache(784.0)   . Helicobacter pylori (H. pylori) infection   . Hypertension, essential   . Insomnia   . Menorrhagia    . Morbid obesity ([place])   . Obesity   . Seasonal allergies   . Shortness of breath   . Sleep apnea    Sleep study 2008 - mild OSA, not enough events to titrate CPAP  . Tobacco user     Past Surgical History:  Procedure Laterality Date  . BREAST REDUCTION SURGERY  09/2011  . TUBAL LIGATION  1996   bilateral    Family History  Problem Relation Age of Onset  . Hypertension Mother   . Asthma Daughter   . Cancer Paternal Aunt   . Asthma Maternal Grandmother     Social History  Substance Use Topics  . Smoking status: Former Smoker    Years: 18.00    Types: Cigarettes  . Smokeless tobacco: Never Used  . Alcohol use No   ROS Review of Systems  Constitutional: Negative for chills and fever.  Eyes: Negative for visual disturbance.  Respiratory: Positive for shortness of breath.   Cardiovascular: Negative for chest pain.  Gastrointestinal: Negative for abdominal pain and blood in stool.  Musculoskeletal: Positive for arthralgias. Negative for back pain.  Skin: Negative for rash and wound.  Allergic/Immunologic: Negative for immunocompromised state.  Hematological: Negative for adenopathy. Does not bruise/bleed easily.  Psychiatric/Behavioral: Positive for dysphoric mood and sleep disturbance. Negative for suicidal ideas. The patient is nervous/anxious.     Objective:   Today's Vitals: BP (!) 171/82 (BP Location: Left Wrist, Patient  Position: Sitting, Cuff Size: Small)   Pulse 93   Temp 98 F (36.7 C) (Oral)   Ht 5\' 6"  (1.676 m)   Wt (!) 340 lb 3.2 oz (154.3 kg)   LMP 05/17/2016   SpO2 96%   BMI 54.91 kg/m   Wt Readings from Last 3 Encounters:  05/21/16 (!) 340 lb 3.2 oz (154.3 kg)  05/03/16 (!) 347 lb 6.4 oz (157.6 kg)  04/30/16 300 lb (136.1 kg)   BP Readings from Last 3 Encounters:  05/21/16 (!) 171/82  05/08/16 160/88  05/07/16 (!) 164/81    Physical Exam  Constitutional: She is oriented to person, place, and time. She appears well-developed and  well-nourished. No distress.  Obese   HENT:  Head: Normocephalic and atraumatic.  Cardiovascular: Normal rate, regular rhythm, normal heart sounds and intact distal pulses.   Pulmonary/Chest: Effort normal and breath sounds normal.  Musculoskeletal: She exhibits no edema.       Left knee: Tenderness found. Medial joint line and lateral joint line tenderness noted.  Neurological: She is alert and oriented to person, place, and time.  Skin: Skin is warm and dry. No rash noted.  Psychiatric: She has a normal mood and affect.   Lab Results  Component Value Date   HGBA1C 6.4 05/03/2016   Lab Results  Component Value Date   HGBA1C 6.4 05/03/2016     Depression screen [facility] 2/9 05/21/2016 05/03/2016 03/27/2016  Decreased Interest 3 3 3   Down, Depressed, Hopeless 3 2 3   PHQ - 2 Score 6 5 6   Altered sleeping 3 3 3   Tired, decreased energy 3 3 3   Change in appetite 3 3 3   Feeling bad or failure about yourself  3 3 3   Trouble concentrating 3 3 0  Moving slowly or fidgety/restless 0 1 0  Suicidal thoughts 0 0 0  PHQ-9 Score 21 21 18   Some recent data might be hidden   GAD 7 : Generalized Anxiety Score 05/21/2016 05/03/2016 03/27/2016 12/27/2015  Nervous, Anxious, on Edge 3 2 3 2   Control/stop worrying 3 2 2 3   Worry too much - different things 3 2 3 3   Trouble relaxing 3 2 3 3   Restless 0 0 1 3  Easily annoyed or irritable 3 0 3 1  Afraid - awful might happen 2 2 2 2   Total GAD 7 Score 17 10 17 17       Chemistry      Component Value Date/Time   NA 138 05/06/2016 0900   K 3.4 (L) 05/06/2016 0900   CL 104 05/06/2016 0900   CO2 24 05/06/2016 0900   BUN 7 05/06/2016 0900   CREATININE 0.75 05/06/2016 0900   CREATININE 0.82 02/01/2014 1607      Component Value Date/Time   CALCIUM 8.4 (L) 05/06/2016 0900   ALKPHOS 62 01/30/2016 1226   AST 36 01/30/2016 1226   ALT 47 01/30/2016 1226   BILITOT 0.4 01/30/2016 1226      Assessment & Plan:   Problem List Items Addressed This Visit       High   Essential hypertension (Chronic)   Relevant Medications   spironolactone (ALDACTONE) 50 MG tablet   Other Relevant Orders   BASIC METABOLIC PANEL WITH GFR   Asthma, chronic obstructive, without status asthmaticus (HCC) - Primary (Chronic)   Relevant Medications   predniSONE (DELTASONE) 20 MG tablet   Other Relevant Orders   CBC    Other Visit Diagnoses    Primary insomnia  Relevant Medications   traZODone (DESYREL) 50 MG tablet   Other Relevant Orders   Split night study   Screening-pulmonary TB       Relevant Orders   PPD (Completed)      Outpatient Encounter Prescriptions as of 05/21/2016  Medication Sig  . albuterol (PROVENTIL) (2.5 MG/3ML) 0.083% nebulizer solution Take 3 mLs (2.5 mg total) by nebulization every 4 (four) hours as needed for wheezing or shortness of breath.  [name] albuterol (VENTOLIN HFA) 108 (90 Base) MCG/ACT inhaler INHALE 1-2 PUFFS INTO THE LUNGS EVERY 4 HOURS as needed (Patient taking differently: Inhale 2 puffs into the lungs every 4 (four) hours as needed for wheezing or shortness of breath. )  . amLODipine (NORVASC) 10 MG tablet Take 1 tablet (10 mg total) by mouth daily.  . benzonatate (TESSALON) 100 MG capsule Take 1 capsule (100 mg total) by mouth 3 (three) times daily as needed for cough.  . esomeprazole (NEXIUM) 40 MG capsule Take 1 capsule (40 mg total) by mouth daily. (Patient taking differently: Take 40 mg by mouth daily before supper. )  . fluticasone (FLONASE) 50 MCG/ACT nasal spray Place 2 sprays into both nostrils daily.  [name] guaiFENesin (MUCINEX) 600 MG 12 hr tablet Take 2 tablets (1,200 mg total) by mouth 2 (two) times daily.  [name] ibuprofen (ADVIL,MOTRIN) 200 MG tablet Take 600 mg by mouth 2 (two) times daily as needed for moderate pain (knee pain).   [name] ipratropium (ATROVENT) 0.02 % nebulizer solution Take 2.5 mLs (0.5 mg total) by nebulization 4 (four) times daily as needed for wheezing or shortness of breath.  . loratadine  (CLARITIN) 10 MG tablet Take 1 tablet (10 mg total) by mouth daily.  . metFORMIN (GLUCOPHAGE) 500 MG tablet Take 1 tablet (500 mg total) by mouth 2 (two) times daily with a meal.  . mometasone-formoterol (DULERA) 200-5 MCG/ACT AERO Inhale 2 puffs into the lungs 2 (two) times daily.  . sertraline (ZOLOFT) 50 MG tablet Take 1 tablet (50 mg total) by mouth daily.  . Tiotropium Bromide Monohydrate (SPIRIVA RESPIMAT) 2.5 MCG/ACT AERS Inhale 2 puffs into the lungs daily.  [name] acetaminophen (TYLENOL) 500 MG tablet Take 2 tablets (1,000 mg total) by mouth every 6 (six) hours as needed. (Patient not taking: Reported on 05/21/2016)  . nicotine (NICODERM CQ - DOSED IN MG/24 HOURS) 14 mg/24hr patch Place 14 mg onto the skin 2 (two) times a week. Wednesdays and Sundays  . predniSONE (DELTASONE) 10 MG tablet 60 mg x 2 days then 50 mg x 2 days then 40 mg x 2 days then 30 mg x2 days then 20 mg x 2 days then 10 mg x2 days and stop (Patient not taking: Reported on 05/21/2016)   Facility-Administered Encounter Medications as of 05/21/2016  Medication  . omalizumab Arvid Right) injection 225 mg  . omalizumab Arvid Right) injection 225 mg    Follow-up: Return in about 2 weeks (around 06/04/2016) for HTN .    Boykin Nearing MD

## 2016-05-21 NOTE — Assessment & Plan Note (Signed)
Insomnia with depressed mood Add trazodone

## 2016-05-21 NOTE — Patient Instructions (Addendum)
April [name] was seen today for hypertension and asthma.  Diagnoses and all orders for this visit:  Asthma, chronic obstructive, without status asthmaticus ([place]) -     CBC -     predniSONE (DELTASONE) 20 MG tablet; Take every morning with food 60 mg daily for 3 days, 40 mg daily for 3 days, 30 mg daily for 3 days, 20 mg daily for 3 days, 10 mg daily for 3 days then STOP  Essential hypertension -     spironolactone (ALDACTONE) 50 MG tablet; Take 2 tablets (100 mg total) by mouth daily. -     BASIC METABOLIC PANEL WITH GFR  Primary insomnia -     Split night study; Future -     traZODone (DESYREL) 50 MG tablet; Take 0.5-1 tablets (25-50 mg total) by mouth at bedtime as needed for sleep.   F/u in 2 weeks for HTN  Dr. Adrian Blackwater

## 2016-05-22 LAB — BASIC METABOLIC PANEL WITH GFR
BUN: 6 mg/dL — ABNORMAL LOW (ref 7–25)
CALCIUM: 9.2 mg/dL (ref 8.6–10.2)
CO2: 28 mmol/L (ref 20–31)
CREATININE: 0.56 mg/dL (ref 0.50–1.10)
Chloride: 104 mmol/L (ref 98–110)
GFR, Est African American: >89 mL/min (ref >=60)
GFR, Est Non African American: >89 mL/min (ref >=60)
Glucose, Bld: 113 mg/dL — ABNORMAL HIGH (ref 65–99)
Potassium: 4.4 mmol/L (ref 3.5–5.3)
SODIUM: 139 mmol/L (ref 135–146)

## 2016-05-23 ENCOUNTER — Ambulatory Visit: Payer: Self-pay

## 2016-05-23 ENCOUNTER — Ambulatory Visit: Payer: Medicaid Other | Attending: Family Medicine | Admitting: *Deleted

## 2016-05-23 DIAGNOSIS — Z111 Encounter for screening for respiratory tuberculosis: Secondary | ICD-10-CM | POA: Diagnosis present

## 2016-05-23 NOTE — Progress Notes (Signed)
PPD Reading Note  PPD read and results entered in EpicCare.  Result: 0 mm induration.  Interpretation: Negative  Allergic reaction: no

## 2016-05-24 ENCOUNTER — Telehealth: Payer: Self-pay

## 2016-05-24 ENCOUNTER — Telehealth: Payer: Self-pay | Admitting: Family Medicine

## 2016-05-24 NOTE — Telephone Encounter (Signed)
Pt was called and a VM was left informing pt to return phone call for lab results. 

## 2016-05-24 NOTE — Telephone Encounter (Signed)
Pt returning call for lab results  

## 2016-05-24 NOTE — Telephone Encounter (Signed)
Returned pt call and went over lab results pt is aware and doesn't have any questions or concerns  

## 2016-05-28 ENCOUNTER — Ambulatory Visit: Payer: Self-pay | Admitting: Family Medicine

## 2016-06-03 ENCOUNTER — Telehealth: Payer: Self-pay | Admitting: Internal Medicine

## 2016-06-03 ENCOUNTER — Other Ambulatory Visit: Payer: Self-pay | Admitting: Family Medicine

## 2016-06-03 MED ORDER — PREDNISONE 10 MG PO TABS: ORAL_TABLET | ORAL | 0 refills | Status: DC

## 2016-06-03 MED ORDER — ALBUTEROL SULFATE HFA 108 (90 BASE) MCG/ACT IN AERS: INHALATION_SPRAY | RESPIRATORY_TRACT | 3 refills | Status: DC

## 2016-06-03 NOTE — Telephone Encounter (Signed)
Pt aware of recs.  rx's sent to preferred pharmacy.  Nothing further needed.  

## 2016-06-03 NOTE — Telephone Encounter (Signed)
Offer prednisone 10 mg # 100  4 X 2 DAYS, 3 X 2 DAYS, 2 X 2 DAYS, 1 X 2 DAYS, then 1 every other day for maintenance No refill

## 2016-06-03 NOTE — Telephone Encounter (Signed)
Spoke with pt. States that she is having a flare up. Reports increased SOB and wheezing. Denies chest tightness or coughing. Symptoms started 2-3 days ago. She is requesting to have prednisone sent to her pharmacy. Her Albuterol HFA has been refilled per her request.  CY - please advise. Thanks.  No Known Allergies Current Outpatient Prescriptions on File Prior to Visit  Medication Sig Dispense Refill  . acetaminophen (TYLENOL) 500 MG tablet Take 2 tablets (1,000 mg total) by mouth every 6 (six) hours as needed. (Patient not taking: Reported on 05/21/2016) 90 tablet 3  . albuterol (PROVENTIL) (2.5 MG/3ML) 0.083% nebulizer solution Take 3 mLs (2.5 mg total) by nebulization every 4 (four) hours as needed for wheezing or shortness of breath. 150 mL 6  . albuterol (VENTOLIN HFA) 108 (90 Base) MCG/ACT inhaler INHALE 1-2 PUFFS INTO THE LUNGS EVERY 4 HOURS as needed (Patient taking differently: Inhale 2 puffs into the lungs every 4 (four) hours as needed for wheezing or shortness of breath. ) 54 g 3  . amLODipine (NORVASC) 10 MG tablet Take 1 tablet (10 mg total) by mouth daily. 30 tablet 2  . benzonatate (TESSALON) 100 MG capsule Take 1 capsule (100 mg total) by mouth 3 (three) times daily as needed for cough. 20 capsule 0  . esomeprazole (NEXIUM) 40 MG capsule Take 1 capsule (40 mg total) by mouth daily. (Patient taking differently: Take 40 mg by mouth daily before supper. ) 30 capsule 0  . fluticasone (FLONASE) 50 MCG/ACT nasal spray Place 2 sprays into both nostrils daily. 16 g 6  . guaiFENesin (MUCINEX) 600 MG 12 hr tablet Take 2 tablets (1,200 mg total) by mouth 2 (two) times daily. 28 tablet 0  . ibuprofen (ADVIL,MOTRIN) 200 MG tablet Take 600 mg by mouth 2 (two) times daily as needed for moderate pain (knee pain).     [name] ipratropium (ATROVENT) 0.02 % nebulizer solution Take 2.5 mLs (0.5 mg total) by nebulization 4 (four) times daily as needed for wheezing or shortness of breath. 75 mL 2  .  loratadine (CLARITIN) 10 MG tablet Take 1 tablet (10 mg total) by mouth daily. 90 tablet 3  . metFORMIN (GLUCOPHAGE) 500 MG tablet Take 1 tablet (500 mg total) by mouth 2 (two) times daily with a meal. 60 tablet 0  . mometasone-formoterol (DULERA) 200-5 MCG/ACT AERO Inhale 2 puffs into the lungs 2 (two) times daily.    . nicotine (NICODERM CQ - DOSED IN MG/24 HOURS) 14 mg/24hr patch Place 14 mg onto the skin 2 (two) times a week. Wednesdays and Sundays    . predniSONE (DELTASONE) 20 MG tablet Take every morning with food 60 mg daily for 3 days, 40 mg daily for 3 days, 30 mg daily for 3 days, 20 mg daily for 3 days, 10 mg daily for 3 days then STOP 24 tablet 0  . sertraline (ZOLOFT) 50 MG tablet Take 1 tablet (50 mg total) by mouth daily. 90 tablet 3  . spironolactone (ALDACTONE) 50 MG tablet Take 2 tablets (100 mg total) by mouth daily. 60 tablet 2  . Tiotropium Bromide Monohydrate (SPIRIVA RESPIMAT) 2.5 MCG/ACT AERS Inhale 2 puffs into the lungs daily. 1 Inhaler 0  . traZODone (DESYREL) 50 MG tablet Take 1 tablet (50 mg total) by mouth at bedtime. 30 tablet 0  . traZODone (DESYREL) 50 MG tablet Take 0.5-1 tablets (25-50 mg total) by mouth at bedtime as needed for sleep. 30 tablet 3   Current Facility-Administered Medications on File  Prior to Visit  Medication Dose Route Frequency Provider Last Rate Last Dose  . omalizumab Arvid Right) injection 225 mg  225 mg Subcutaneous Q14 Days Deneise Lever, MD   225 mg at 03/27/16 1156  . omalizumab Arvid Right) injection 225 mg  225 mg Subcutaneous Q14 Days Deneise Lever, MD   225 mg at 05/09/16 [phone]

## 2016-06-06 ENCOUNTER — Telehealth: Payer: Self-pay | Admitting: Internal Medicine

## 2016-06-06 DIAGNOSIS — J449 Chronic obstructive pulmonary disease, unspecified: Secondary | ICD-10-CM

## 2016-06-06 MED ORDER — ALBUTEROL SULFATE (2.5 MG/3ML) 0.083% IN NEBU: 2.5000 mg | INHALATION_SOLUTION | RESPIRATORY_TRACT | 2 refills | Status: DC | PRN

## 2016-06-06 MED ORDER — MOMETASONE FURO-FORMOTEROL FUM 200-5 MCG/ACT IN AERO: 2.0000 | INHALATION_SPRAY | Freq: Two times a day (BID) | RESPIRATORY_TRACT | 2 refills | Status: DC

## 2016-06-06 MED ORDER — ALBUTEROL SULFATE HFA 108 (90 BASE) MCG/ACT IN AERS: 1.0000 | INHALATION_SPRAY | Freq: Four times a day (QID) | RESPIRATORY_TRACT | 2 refills | Status: DC | PRN

## 2016-06-06 MED ORDER — IPRATROPIUM BROMIDE 0.02 % IN SOLN: 0.5000 mg | Freq: Four times a day (QID) | RESPIRATORY_TRACT | 2 refills | Status: DC | PRN

## 2016-06-06 NOTE — Telephone Encounter (Signed)
[phone] pt calling back

## 2016-06-06 NOTE — Telephone Encounter (Signed)
Patient calling back stating she needs Dulera refilled also.

## 2016-06-06 NOTE — Telephone Encounter (Signed)
lmtcb for pt.  

## 2016-06-06 NOTE — Telephone Encounter (Signed)
Spoke with pt. She is needing several prescriptions sent to her pharmacy. Pt needs Proair, Dulera, Albuterol neb solution and Ipratropium neb solution. All 4 prescriptions have been sent in. Nothing further was needed.

## 2016-06-07 ENCOUNTER — Ambulatory Visit (INDEPENDENT_AMBULATORY_CARE_PROVIDER_SITE_OTHER): Payer: Medicaid Other

## 2016-06-07 ENCOUNTER — Telehealth: Payer: Self-pay | Admitting: Internal Medicine

## 2016-06-07 DIAGNOSIS — J4520 Mild intermittent asthma, uncomplicated: Secondary | ICD-10-CM | POA: Diagnosis not present

## 2016-06-07 IMAGING — CR DG CHEST 1V PORT
1 series · 1 of 1 positions shown · non-contrast
Comparison: 04/24/2015 .

CLINICAL DATA: Cough and wheezing.

EXAM:
PORTABLE CHEST 1 VIEW

[AP]
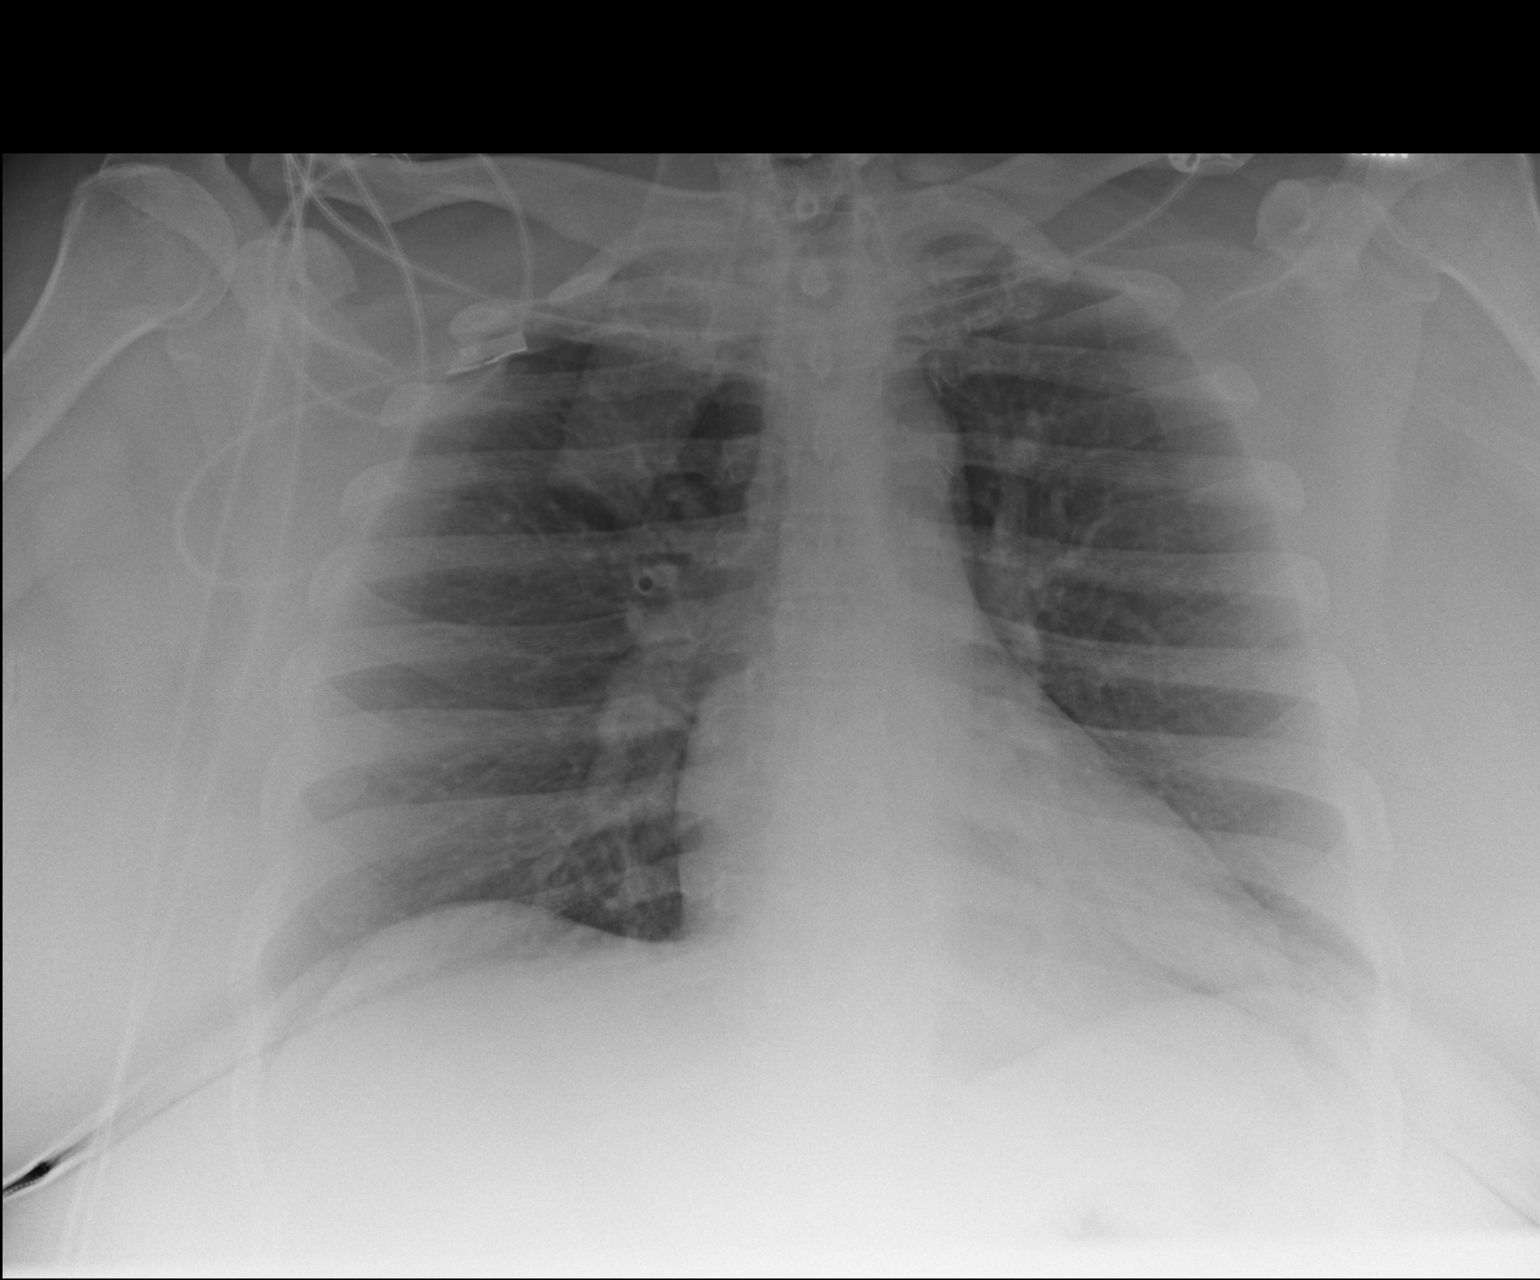

[1 of 1 positions shown; findings below may reference images not displayed]

FINDINGS: Mediastinum and hilar structures normal. The lungs are clear. Heart
size normal. No pleural effusion or pneumothorax. No acute bony
abnormality .
IMPRESSION: No acute cardiopulmonary disease.

## 2016-06-07 MED ORDER — ESOMEPRAZOLE MAGNESIUM 40 MG PO CPDR: 40.0000 mg | DELAYED_RELEASE_CAPSULE | Freq: Every day | ORAL | 12 refills | Status: DC

## 2016-06-07 NOTE — Telephone Encounter (Signed)
Ok to refill nexium x 12 months

## 2016-06-07 NOTE — Telephone Encounter (Signed)
Rx has been sent in per CY. Pt is aware. Nothing further was needed. 

## 2016-06-07 NOTE — Telephone Encounter (Signed)
Called and spoke with pt and she is requesting that CY send in a refill for her nexium.  Pt stated that she is out and really needs this medication.  CY please advise. Thanks  Last ov--03/20/15 Next ov--no pending appts.    No Known Allergies

## 2016-06-09 IMAGING — CR DG CHEST 2V
2 series · 2 of 2 positions shown · non-contrast
Comparison: May 23, 2015

CLINICAL DATA: Shortness of breath and cough for 1 day

EXAM:
CHEST  2 VIEW

[w chest pa]
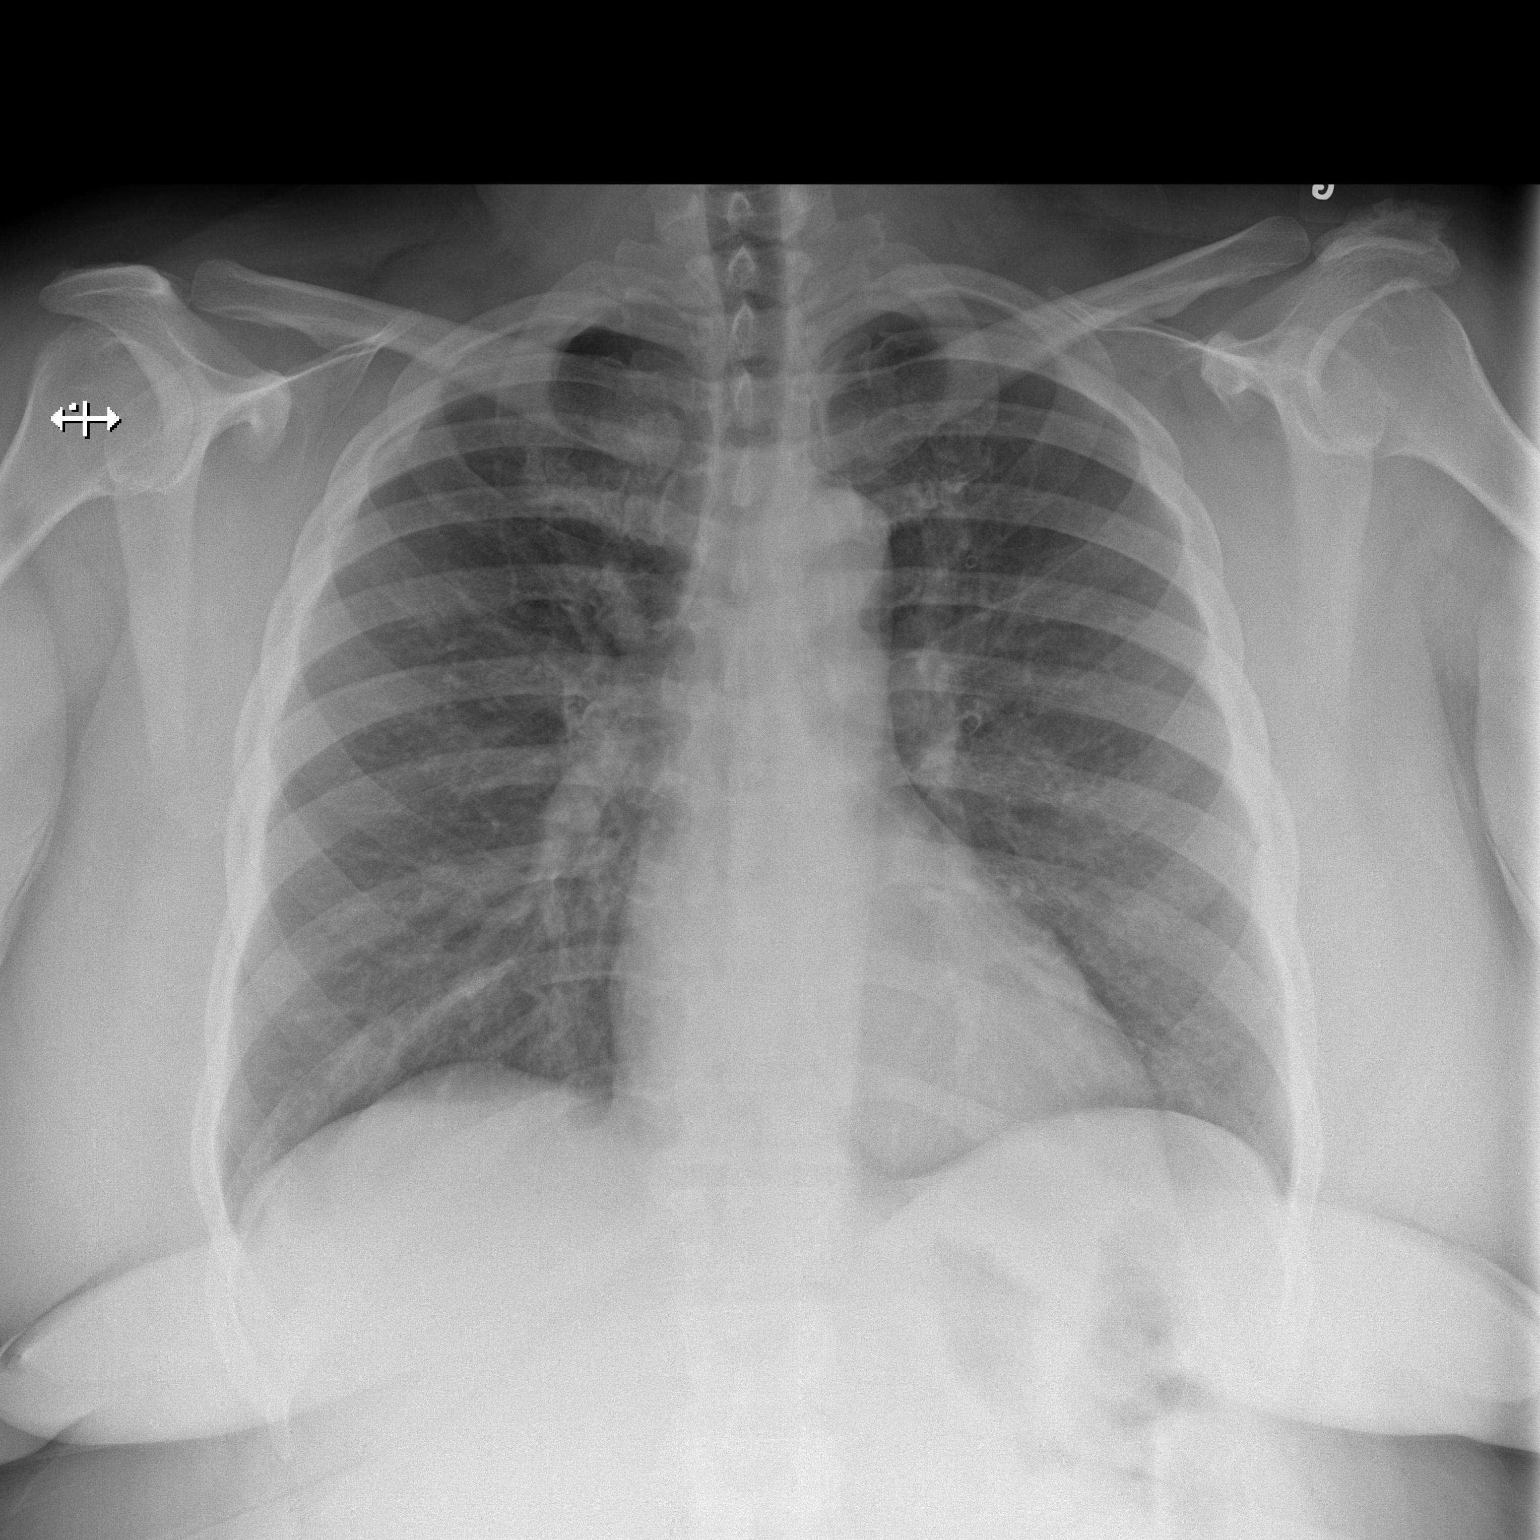

[w chest lat]
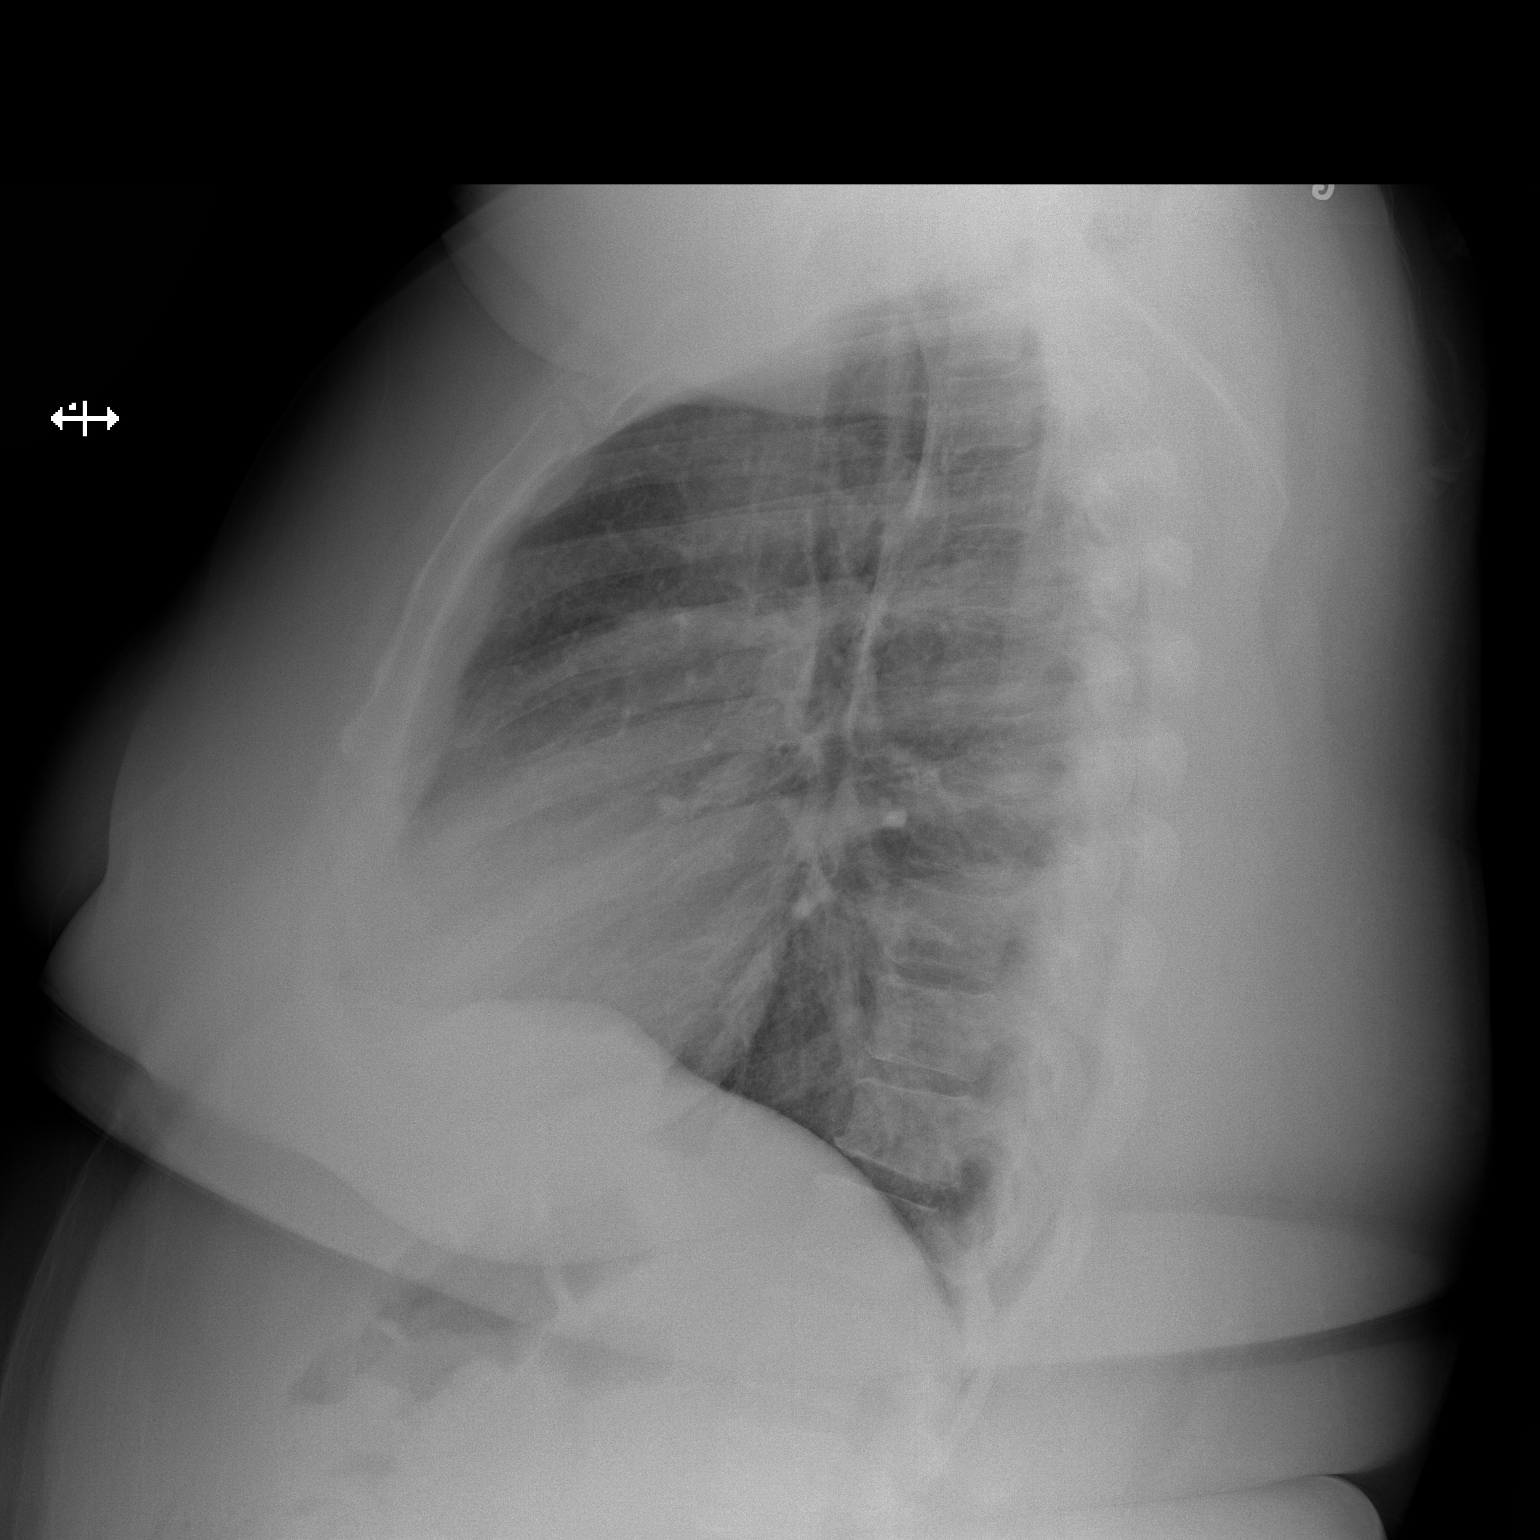

[2 of 2 positions shown; findings below may reference images not displayed]

FINDINGS: There is no edema or consolidation. The heart size and pulmonary
vascularity are normal. No adenopathy. No bone lesions.
IMPRESSION: No edema or consolidation.

## 2016-06-10 ENCOUNTER — Telehealth: Payer: Self-pay | Admitting: Internal Medicine

## 2016-06-10 ENCOUNTER — Other Ambulatory Visit: Payer: Self-pay

## 2016-06-10 MED ORDER — ESOMEPRAZOLE MAGNESIUM 40 MG PO CPDR: 40.0000 mg | DELAYED_RELEASE_CAPSULE | Freq: Every day | ORAL | 12 refills | Status: DC

## 2016-06-10 NOTE — Telephone Encounter (Addendum)
#   vials:4 Ordered date:06/10/16 Shipping Date:06/18/16

## 2016-06-11 MED ORDER — OMALIZUMAB 150 MG ~~LOC~~ SOLR
225.0000 mg | SUBCUTANEOUS | Status: DC
  Administered 2016-06-07: 225 mg via SUBCUTANEOUS

## 2016-06-11 NOTE — Telephone Encounter (Signed)
Ena Dawley with [facility], [phone], calling to verify that we have sterile water here in stock as there is a Producer, television/film/video and they are calling offices to confirm this before it is shipped.  Ref# [id number]

## 2016-06-11 NOTE — Progress Notes (Signed)
Documentation of medication administration of Xolair and charges have been completed by Lindsay Lemons, CMA based on the Xolair documentation sheet completed by Tammy Scott.  

## 2016-06-13 ENCOUNTER — Telehealth: Payer: Self-pay | Admitting: Internal Medicine

## 2016-06-13 ENCOUNTER — Ambulatory Visit: Payer: Medicaid Other | Admitting: Internal Medicine

## 2016-06-13 MED FILL — $VENTOLIN HFA 18G INHALER: 108 (90 BAS | 16 days supply | Qty: 18 | Fill #3

## 2016-06-13 NOTE — Telephone Encounter (Signed)
Spoke with pt. Advised her that our office does not fill her sleep meds. Pt will need to contact her PCP. She verbalized understanding. Nothing further was needed.

## 2016-06-17 NOTE — Telephone Encounter (Signed)
Called [employer] back to let them know we have sterile water, so hopefully there will not be a delay. Nothing further needed.

## 2016-06-18 ENCOUNTER — Encounter (HOSPITAL_COMMUNITY): Payer: Self-pay | Admitting: Family Medicine

## 2016-06-18 ENCOUNTER — Ambulatory Visit (HOSPITAL_COMMUNITY)
Admission: EM | Admit: 2016-06-18 | Discharge: 2016-06-18 | Disposition: A | Discharge status: Home / Self Care | Payer: Medicaid Other | Attending: Internal Medicine | Admitting: Internal Medicine

## 2016-06-18 DIAGNOSIS — G8929 Other chronic pain: Secondary | ICD-10-CM | POA: Diagnosis not present

## 2016-06-18 DIAGNOSIS — J4551 Severe persistent asthma with (acute) exacerbation: Secondary | ICD-10-CM | POA: Diagnosis not present

## 2016-06-18 DIAGNOSIS — M25562 Pain in left knee: Secondary | ICD-10-CM | POA: Diagnosis not present

## 2016-06-18 MED ORDER — PREDNISONE 10 MG PO TABS: ORAL_TABLET | ORAL | 0 refills | Status: DC

## 2016-06-18 MED ORDER — METHYLPREDNISOLONE SODIUM SUCC 125 MG IJ SOLR
125.0000 mg | Freq: Once | INTRAMUSCULAR | Status: AC
  Administered 2016-06-18: 125 mg via INTRAMUSCULAR

## 2016-06-18 MED ORDER — ALBUTEROL SULFATE (2.5 MG/3ML) 0.083% IN NEBU
2.5000 mg | INHALATION_SOLUTION | Freq: Once | RESPIRATORY_TRACT | Status: AC
  Administered 2016-06-18: 2.5 mg via RESPIRATORY_TRACT

## 2016-06-18 MED ORDER — CETIRIZINE-PSEUDOEPHEDRINE ER 5-120 MG PO TB12: 1.0000 | ORAL_TABLET | Freq: Every day | ORAL | 0 refills | Status: DC

## 2016-06-18 MED ORDER — METHYLPREDNISOLONE SODIUM SUCC 125 MG IJ SOLR
INTRAMUSCULAR | Status: AC
  Filled 2016-06-18: qty 2

## 2016-06-18 MED ORDER — ALBUTEROL SULFATE (2.5 MG/3ML) 0.083% IN NEBU
INHALATION_SOLUTION | RESPIRATORY_TRACT | Status: AC
  Filled 2016-06-18: qty 3

## 2016-06-18 MED ORDER — ALBUTEROL SULFATE HFA 108 (90 BASE) MCG/ACT IN AERS: 1.0000 | INHALATION_SPRAY | Freq: Four times a day (QID) | RESPIRATORY_TRACT | 2 refills | Status: DC | PRN

## 2016-06-18 NOTE — ED Provider Notes (Addendum)
[facility]    CSN: [id number] Arrival date & time: 06/18/16  1512     History   Chief Complaint Chief Complaint  Patient presents with  . Knee Pain  . Asthma    HPI April [name] is a 46 y.o. female.   Pt c/o coughing and SOB x3 days at least.  She states that she has been using her albuterol inhaler around the clock now without relief. Admits that she was so SOB this morning that she became fatigued while showering.  Denies recent illness      Past Medical History:  Diagnosis Date  . Acanthosis nigricans   . Arthritis   . Asthma    Followed by Dr. Annamaria Boots (pulmonology); receives every other week omalizumab injections; has frequent exacerbations  . COPD (chronic obstructive pulmonary disease) ([place])    PFTs in 2002, FEV1/FVC 65, no post bronchodilater test done  . Depression   . GERD (gastroesophageal reflux disease)   . Headache(784.0)   . Helicobacter pylori (H. pylori) infection   . Hypertension, essential   . Insomnia   . Menorrhagia   . Morbid obesity ([place])   . Obesity   . Seasonal allergies   . Shortness of breath   . Sleep apnea    Sleep study 2008 - mild OSA, not enough events to titrate CPAP  . Tobacco user     Patient Active Problem List   Diagnosis Date Noted  . Malignant hypertension due to primary aldosteronism ([place]) 05/05/2016  . Lip laceration 05/05/2016  . Elevated hemoglobin A1c 05/05/2016  . Asthma exacerbation 11/10/2015  . GERD (gastroesophageal reflux disease) 08/30/2015  . Generalized anxiety disorder 08/30/2015  . Seasonal allergic rhinitis 08/29/2013  . Asthma, chronic obstructive, without status asthmaticus ([place]) 05/07/2012  . Knee pain, bilateral 04/25/2011  . Primary insomnia 03/14/2011  . Obstructive sleep apnea 12/19/2010  . Hypokalemia 08/13/2010  . Cervical back pain with evidence of disc disease 04/08/2008  . Essential hypertension 07/31/2006  . Morbid obesity with body mass index of 50.0-59.9 in adult ([place])  06/17/2006  . Major depressive disorder, recurrent episode ([place]) 04/10/2006    Past Surgical History:  Procedure Laterality Date  . BREAST REDUCTION SURGERY  09/2011  . TUBAL LIGATION  1996   bilateral    OB History    No data available       Home Medications    Prior to Admission medications   Medication Sig Start Date End Date Taking? Authorizing Provider  acetaminophen (TYLENOL) 500 MG tablet Take 2 tablets (1,000 mg total) by mouth every 6 (six) hours as needed. Patient not taking: Reported on 05/21/2016 08/30/15   [name], MD  albuterol [facility] HFA) [phone] Base) MCG/ACT inhaler Inhale 1-2 puffs into the lungs every 6 (six) hours as needed for wheezing or shortness of breath. 06/18/16   Harrie Foreman, MD  albuterol (PROVENTIL) (2.5 MG/3ML) 0.083% nebulizer solution Take 3 mLs (2.5 mg total) by nebulization every 4 (four) hours as needed for wheezing or shortness of breath. 06/06/16   Deneise Lever, MD  amLODipine (NORVASC) 10 MG tablet Take 1 tablet (10 mg total) by mouth daily. 05/03/16   Josalyn Funches, MD  benzonatate (TESSALON) 100 MG capsule Take 1 capsule (100 mg total) by mouth 3 (three) times daily as needed for cough. 05/07/16   Geradine Girt, DO  esomeprazole (NEXIUM) 40 MG capsule Take 1 capsule (40 mg total) by mouth daily. 06/10/16   Deneise Lever,  MD  fluticasone (FLONASE) 50 MCG/ACT nasal spray Place 2 sprays into both nostrils daily. 12/27/15   Elsie Stain, MD  guaiFENesin (MUCINEX) 600 MG 12 hr tablet Take 2 tablets (1,200 mg total) by mouth 2 (two) times daily. 05/07/16   Geradine Girt, DO  ibuprofen (ADVIL,MOTRIN) 200 MG tablet Take 600 mg by mouth 2 (two) times daily as needed for moderate pain (knee pain).     Historical Provider, MD  ipratropium (ATROVENT) 0.02 % nebulizer solution Take 2.5 mLs (0.5 mg total) by nebulization 4 (four) times daily as needed for wheezing or shortness of breath. 06/06/16   Deneise Lever, MD  loratadine (CLARITIN) 10 MG  tablet Take 1 tablet (10 mg total) by mouth daily. 08/30/15   [name], MD  metFORMIN (GLUCOPHAGE) 500 MG tablet Take 1 tablet (500 mg total) by mouth 2 (two) times daily with a meal. 05/07/16   Geradine Girt, DO  mometasone-formoterol (DULERA) 200-5 MCG/ACT AERO Inhale 2 puffs into the lungs 2 (two) times daily. 06/06/16   Deneise Lever, MD  nicotine (NICODERM CQ - DOSED IN MG/24 HOURS) 14 mg/24hr patch Place 14 mg onto the skin 2 (two) times a week. Wednesdays and Sundays    Historical Provider, MD  predniSONE (DELTASONE) 10 MG tablet 40mg X2 days, 30mg  X2 days, 20mg  X2 days, 10mg X2 days, then 10mg  qod. 06/03/16   Deneise Lever, MD  sertraline (ZOLOFT) 50 MG tablet Take 1 tablet (50 mg total) by mouth daily. 08/30/15   [name], MD  spironolactone (ALDACTONE) 50 MG tablet Take 2 tablets (100 mg total) by mouth daily. 05/21/16   Josalyn Funches, MD  Tiotropium Bromide Monohydrate (SPIRIVA RESPIMAT) 2.5 MCG/ACT AERS Inhale 2 puffs into the lungs daily. 05/07/16   Geradine Girt, DO  traZODone (DESYREL) 50 MG tablet Take 1 tablet (50 mg total) by mouth at bedtime. 10/16/10 05/21/17  Rosalia Hammers, MD  traZODone (DESYREL) 50 MG tablet Take 0.5-1 tablets (25-50 mg total) by mouth at bedtime as needed for sleep. 05/21/16   Boykin Nearing, MD    Family History Family History  Problem Relation Age of Onset  . Hypertension Mother   . Asthma Daughter   . Cancer Paternal Aunt   . Asthma Maternal Grandmother     Social History Social History  Substance Use Topics  . Smoking status: Former Smoker    Years: 18.00    Types: Cigarettes  . Smokeless tobacco: Never Used  . Alcohol use No     Allergies   Patient has no known allergies.   Review of Systems Review of Systems  Constitutional: Negative for chills and fever.  HENT: Negative for sore throat and tinnitus.   Eyes: Negative for redness.  Respiratory: Positive for cough, shortness of breath and wheezing.   Cardiovascular: Negative for  chest pain and palpitations.  Gastrointestinal: Negative for abdominal pain, diarrhea, nausea and vomiting.  Genitourinary: Negative for dysuria, frequency and urgency.  Musculoskeletal: Negative for myalgias.  Skin: Negative for rash.       No lesions  Neurological: Negative for weakness.  Hematological: Does not bruise/bleed easily.  Psychiatric/Behavioral: Negative for suicidal ideas.     Physical Exam Triage Vital Signs ED Triage Vitals  Enc Vitals Group     BP 06/18/16 1558 174/99     Pulse Rate 06/18/16 1558 98     Resp 06/18/16 1558 22     Temp 06/18/16 1558 98.9 F (37.2 C)     Temp  Source 06/18/16 1558 Oral     SpO2 06/18/16 1558 98 %     Weight --      Height --      Head Circumference --      Peak Flow --      Pain Score 06/18/16 1557 10     Pain Loc --      Pain Edu? --      Excl. in [place]? --    No data found.   Updated Vital Signs BP 174/99 (BP Location: Right Arm)   Pulse 98   Temp 98.9 F (37.2 C) (Oral)   Resp 22   SpO2 98%   Visual Acuity Right Eye Distance:   Left Eye Distance:   Bilateral Distance:    Right Eye Near:   Left Eye Near:    Bilateral Near:     Physical Exam  Constitutional: She is oriented to person, place, and time. She appears well-developed and well-nourished. No distress.  HENT:  Head: Normocephalic and atraumatic.  Mouth/Throat: Oropharynx is clear and moist.  Eyes: Conjunctivae and EOM are normal. Pupils are equal, round, and reactive to light. No scleral icterus.  Neck: Normal range of motion. Neck supple. No JVD present. No tracheal deviation present. No thyromegaly present.  Cardiovascular: Normal rate, regular rhythm and normal heart sounds.  Exam reveals no gallop and no friction rub.   No murmur heard. Pulmonary/Chest: Effort normal. She has wheezes.  Wheezes bilaterally; moderate air movement.  Patient appears comfortable  Abdominal: Soft. Bowel sounds are normal. She exhibits no distension. There is no  tenderness.  Musculoskeletal: Normal range of motion. She exhibits no edema.  Lymphadenopathy:    She has no cervical adenopathy.  Neurological: She is alert and oriented to person, place, and time. No cranial nerve deficit.  Skin: Skin is warm and dry.  Psychiatric: She has a normal mood and affect. Her behavior is normal. Judgment and thought content normal.  Nursing note and vitals reviewed.    UC Treatments / Results  Labs (all labs ordered are listed, but only abnormal results are displayed) Labs Reviewed - No data to display  EKG  EKG Interpretation None       Radiology No results found.  Procedures Procedures (including critical care time)  Medications Ordered in UC Medications  methylPREDNISolone sodium succinate (SOLU-MEDROL) 125 mg/2 mL injection 125 mg (125 mg Intramuscular Given 06/18/16 1743)  albuterol (PROVENTIL) (2.5 MG/3ML) 0.083% nebulizer solution 2.5 mg (2.5 mg Nebulization Given 06/18/16 1744)     Initial Impression / Assessment and Plan / UC Course  I have reviewed the triage vital signs and the nursing notes.  Pertinent labs & imaging results that were available during my care of the patient were reviewed by me and considered in my medical decision making (see chart for details).     Uncontrolled asthma.  Patient already taking Dulera.  Sees her pulmonologist in a few days.  Mild improvement in aeration L>R lung fields following breathing treatment.  O2 sat excellent on room air. Rx antihistamine for trigger modification.  Steroid taper.    Chronic knee pain.  Patient is dismayed that nothing seems to help the pain.  Suggest capsacin creme as NSAIDs including tramadol have not worked.  Advised losing weight as well (patient is trying to get approval for gastric sleeve).  Final Clinical Impressions(s) / UC Diagnoses   Final diagnoses:  Severe persistent asthma with exacerbation  Chronic pain of left knee    New  Prescriptions Current Discharge  Medication List       Harrie Foreman, MD 06/18/16 Evalee Jefferson    Harrie Foreman, MD 06/18/16 [phone]

## 2016-06-18 NOTE — ED Triage Notes (Signed)
Pt here for left leg pain and asthma exacerbation. sts that she has been using nebulizer without relief.

## 2016-06-19 NOTE — Telephone Encounter (Signed)
#   Vials:4 Arrival Date:06/19/16 Lot [id number] Exp Date:9/21

## 2016-06-21 ENCOUNTER — Emergency Department (HOSPITAL_COMMUNITY): Payer: Medicaid Other

## 2016-06-21 ENCOUNTER — Encounter (HOSPITAL_COMMUNITY): Payer: Self-pay

## 2016-06-21 ENCOUNTER — Observation Stay (HOSPITAL_COMMUNITY)
Admission: EM | Admit: 2016-06-21 | Discharge: 2016-06-21 | Discharge status: Against Medical Advice | Payer: Medicaid Other | Attending: Family Medicine | Admitting: Family Medicine

## 2016-06-21 DIAGNOSIS — J45901 Unspecified asthma with (acute) exacerbation: Secondary | ICD-10-CM | POA: Diagnosis not present

## 2016-06-21 DIAGNOSIS — F329 Major depressive disorder, single episode, unspecified: Secondary | ICD-10-CM | POA: Diagnosis not present

## 2016-06-21 DIAGNOSIS — Z7951 Long term (current) use of inhaled steroids: Secondary | ICD-10-CM | POA: Insufficient documentation

## 2016-06-21 DIAGNOSIS — K219 Gastro-esophageal reflux disease without esophagitis: Secondary | ICD-10-CM | POA: Diagnosis not present

## 2016-06-21 DIAGNOSIS — M25562 Pain in left knee: Secondary | ICD-10-CM | POA: Insufficient documentation

## 2016-06-21 DIAGNOSIS — I160 Hypertensive urgency: Secondary | ICD-10-CM | POA: Diagnosis not present

## 2016-06-21 DIAGNOSIS — I517 Cardiomegaly: Secondary | ICD-10-CM | POA: Diagnosis not present

## 2016-06-21 DIAGNOSIS — J449 Chronic obstructive pulmonary disease, unspecified: Secondary | ICD-10-CM | POA: Diagnosis not present

## 2016-06-21 DIAGNOSIS — Z7952 Long term (current) use of systemic steroids: Secondary | ICD-10-CM | POA: Diagnosis not present

## 2016-06-21 DIAGNOSIS — Z7984 Long term (current) use of oral hypoglycemic drugs: Secondary | ICD-10-CM | POA: Insufficient documentation

## 2016-06-21 DIAGNOSIS — Z6841 Body Mass Index (BMI) 40.0 and over, adult: Secondary | ICD-10-CM | POA: Diagnosis not present

## 2016-06-21 DIAGNOSIS — R0602 Shortness of breath: Secondary | ICD-10-CM | POA: Diagnosis present

## 2016-06-21 DIAGNOSIS — Z87891 Personal history of nicotine dependence: Secondary | ICD-10-CM | POA: Diagnosis not present

## 2016-06-21 DIAGNOSIS — J4551 Severe persistent asthma with (acute) exacerbation: Secondary | ICD-10-CM | POA: Diagnosis not present

## 2016-06-21 DIAGNOSIS — I10 Essential (primary) hypertension: Secondary | ICD-10-CM

## 2016-06-21 DIAGNOSIS — Z5321 Procedure and treatment not carried out due to patient leaving prior to being seen by health care provider: Secondary | ICD-10-CM | POA: Diagnosis not present

## 2016-06-21 DIAGNOSIS — Z79899 Other long term (current) drug therapy: Secondary | ICD-10-CM | POA: Diagnosis not present

## 2016-06-21 DIAGNOSIS — Z720 Tobacco use: Secondary | ICD-10-CM

## 2016-06-21 DIAGNOSIS — M25561 Pain in right knee: Secondary | ICD-10-CM | POA: Insufficient documentation

## 2016-06-21 LAB — I-STAT ARTERIAL BLOOD GAS, ED
Acid-base deficit: 4 mmol/L — ABNORMAL HIGH (ref 0.0–2.0)
Bicarbonate: 20.7 mmol/L (ref 20.0–28.0)
O2 Saturation: 97 %
PCO2 ART: 35.4 mmHg (ref 32.0–48.0)
PH ART: 7.375 (ref 7.350–7.450)
PO2 ART: 93 mmHg (ref 83.0–108.0)
Patient temperature: 98.6
TCO2: 22 mmol/L (ref 0–100)

## 2016-06-21 LAB — COMPREHENSIVE METABOLIC PANEL
ALT: 20 U/L (ref 14–54)
AST: 19 U/L (ref 15–41)
Albumin: 3.3 g/dL — ABNORMAL LOW (ref 3.5–5.0)
Alkaline Phosphatase: 60 U/L (ref 38–126)
Anion gap: 9 (ref 5–15)
BUN: 9 mg/dL (ref 6–20)
CO2: 26 mmol/L (ref 22–32)
Calcium: 8.9 mg/dL (ref 8.9–10.3)
Chloride: 104 mmol/L (ref 101–111)
Creatinine, Ser: 0.78 mg/dL (ref 0.44–1.00)
GFR calc Af Amer: >60 mL/min (ref >60)
GFR calc non Af Amer: >60 mL/min (ref >60)
Glucose, Bld: 143 mg/dL — ABNORMAL HIGH (ref 65–99)
Potassium: 3.3 mmol/L — ABNORMAL LOW (ref 3.5–5.1)
Sodium: 139 mmol/L (ref 135–145)
Total Bilirubin: 0.5 mg/dL (ref 0.3–1.2)
Total Protein: 6.4 g/dL — ABNORMAL LOW (ref 6.5–8.1)

## 2016-06-21 LAB — CBC
HCT: 37.7 % (ref 36.0–46.0)
Hemoglobin: 11.6 g/dL — ABNORMAL LOW (ref 12.0–15.0)
MCH: 25.5 pg — ABNORMAL LOW (ref 26.0–34.0)
MCHC: 30.8 g/dL (ref 30.0–36.0)
MCV: 82.9 fL (ref 78.0–100.0)
Platelets: 370 10*3/uL (ref 150–400)
RBC: 4.55 MIL/uL (ref 3.87–5.11)
RDW: 19.8 % — ABNORMAL HIGH (ref 11.5–15.5)
WBC: 22.9 10*3/uL — ABNORMAL HIGH (ref 4.0–10.5)

## 2016-06-21 LAB — BRAIN NATRIURETIC PEPTIDE: B Natriuretic Peptide: 37.3 pg/mL (ref 0.0–100.0)

## 2016-06-21 MED ORDER — METHYLPREDNISOLONE SODIUM SUCC 125 MG IJ SOLR
125.0000 mg | Freq: Once | INTRAMUSCULAR | Status: AC
Start: 2016-06-21 — End: 2016-06-21
  Administered 2016-06-21: 125 mg via INTRAVENOUS
  Filled 2016-06-21: qty 2

## 2016-06-21 MED ORDER — IPRATROPIUM-ALBUTEROL 0.5-2.5 (3) MG/3ML IN SOLN
3.0000 mL | Freq: Once | RESPIRATORY_TRACT | Status: AC
  Administered 2016-06-21: 3 mL via RESPIRATORY_TRACT
  Filled 2016-06-21: qty 3

## 2016-06-21 MED ORDER — ALBUTEROL SULFATE (2.5 MG/3ML) 0.083% IN NEBU
5.0000 mg | INHALATION_SOLUTION | Freq: Once | RESPIRATORY_TRACT | Status: AC
  Administered 2016-06-21: 5 mg via RESPIRATORY_TRACT

## 2016-06-21 MED ORDER — BENZONATATE 100 MG PO CAPS
100.0000 mg | ORAL_CAPSULE | Freq: Three times a day (TID) | ORAL | Status: DC | PRN
  Administered 2016-06-21: 100 mg via ORAL
  Filled 2016-06-21: qty 1

## 2016-06-21 MED ORDER — ALBUTEROL (5 MG/ML) CONTINUOUS INHALATION SOLN
10.0000 mg/h | INHALATION_SOLUTION | Freq: Once | RESPIRATORY_TRACT | Status: AC
  Administered 2016-06-21: 10 mg/h via RESPIRATORY_TRACT
  Filled 2016-06-21: qty 20

## 2016-06-21 MED ORDER — MAGNESIUM SULFATE 2 GM/50ML IV SOLN
2.0000 g | Freq: Once | INTRAVENOUS | Status: AC
  Administered 2016-06-21: 2 g via INTRAVENOUS
  Filled 2016-06-21: qty 50

## 2016-06-21 MED ORDER — SPIRONOLACTONE 100 MG PO TABS
100.0000 mg | ORAL_TABLET | Freq: Every day | ORAL | Status: DC
  Administered 2016-06-21: 100 mg via ORAL
  Filled 2016-06-21: qty 1

## 2016-06-21 MED ORDER — AMLODIPINE BESYLATE 5 MG PO TABS
10.0000 mg | ORAL_TABLET | Freq: Every day | ORAL | Status: DC
Start: 2016-06-21 — End: 2016-06-22
  Administered 2016-06-21: 10 mg via ORAL
  Filled 2016-06-21: qty 2

## 2016-06-21 MED ORDER — LORATADINE 10 MG PO TABS
10.0000 mg | ORAL_TABLET | Freq: Every day | ORAL | Status: DC
  Administered 2016-06-21: 10 mg via ORAL
  Filled 2016-06-21: qty 1

## 2016-06-21 MED ORDER — ALBUTEROL SULFATE (2.5 MG/3ML) 0.083% IN NEBU: 2.5000 mg | INHALATION_SOLUTION | RESPIRATORY_TRACT | Status: DC | PRN

## 2016-06-21 MED ORDER — ALBUTEROL SULFATE (2.5 MG/3ML) 0.083% IN NEBU
INHALATION_SOLUTION | RESPIRATORY_TRACT | Status: AC
  Filled 2016-06-21: qty 6

## 2016-06-21 NOTE — ED Notes (Signed)
Patient is resting comfortably. 

## 2016-06-21 NOTE — ED Notes (Signed)
Heart healthy tray ordered 

## 2016-06-21 NOTE — ED Provider Notes (Signed)
[facility] DEPT Provider Note   CSN: [id number] Arrival date & time: 06/21/16  1110  By signing my name below, I, Neta Mends, attest that this documentation has been prepared under the direction and in the presence of Margarita Mail, PA-C. Electronically Signed: Neta Mends, ED Scribe. 06/21/2016. 11:34 AM.    History   Chief Complaint Chief Complaint  Patient presents with  . Asthma    The history is provided by the patient. No language interpreter was used.   HPI Comments:  April [name] is a 46 y.o. female with PMHx of asthma, HTN, and GERD who presents to the Emergency Department complaining of worsening a asthma exacerbation x 2-3 days. Pt complains of associated wheezing, productive cough. She states that she has been having difficulty sleeping, and has been having cold-like symptoms for several days. Pt has used a nebulizer and inhaler every 2 hours at home with no relief. Pt denies other associated symptoms.   Past Medical History:  Diagnosis Date  . Acanthosis nigricans   . Arthritis   . Asthma    Followed by Dr. Annamaria Boots (pulmonology); receives every other week omalizumab injections; has frequent exacerbations  . COPD (chronic obstructive pulmonary disease) ([place])    PFTs in 2002, FEV1/FVC 65, no post bronchodilater test done  . Depression   . GERD (gastroesophageal reflux disease)   . Headache(784.0)   . Helicobacter pylori (H. pylori) infection   . Hypertension, essential   . Insomnia   . Menorrhagia   . Morbid obesity ([place])   . Obesity   . Seasonal allergies   . Shortness of breath   . Sleep apnea    Sleep study 2008 - mild OSA, not enough events to titrate CPAP  . Tobacco user     Patient Active Problem List   Diagnosis Date Noted  . Hypertensive urgency, malignant 06/22/2016  . Pulmonary edema 06/22/2016  . Hypertensive urgency 06/22/2016  . Malignant hypertension due to primary aldosteronism ([place]) 05/05/2016  . Lip laceration  05/05/2016  . Elevated hemoglobin A1c 05/05/2016  . Asthma exacerbation 11/10/2015  . GERD (gastroesophageal reflux disease) 08/30/2015  . Generalized anxiety disorder 08/30/2015  . Morbid obesity ([place])   . Seasonal allergic rhinitis 08/29/2013  . Tobacco abuse 10/07/2012  . Asthma, chronic obstructive, without status asthmaticus ([place]) 05/07/2012  . Knee pain, bilateral 04/25/2011  . Primary insomnia 03/14/2011  . Obstructive sleep apnea 12/19/2010  . Hypokalemia 08/13/2010  . Cervical back pain with evidence of disc disease 04/08/2008  . Essential hypertension 07/31/2006  . Morbid obesity with body mass index of 50.0-59.9 in adult ([place]) 06/17/2006  . Major depressive disorder, recurrent episode ([place]) 04/10/2006    Past Surgical History:  Procedure Laterality Date  . BREAST REDUCTION SURGERY  09/2011  . TUBAL LIGATION  1996   bilateral    OB History    No data available       Home Medications    Prior to Admission medications   Medication Sig Start Date End Date Taking? Authorizing Provider  acetaminophen (TYLENOL) 500 MG tablet Take 2 tablets (1,000 mg total) by mouth every 6 (six) hours as needed. 08/30/15  Yes [name], MD  albuterol [facility] HFA) 108 (90 Base) MCG/ACT inhaler Inhale 1-2 puffs into the lungs every 6 (six) hours as needed for wheezing or shortness of breath. 06/18/16  Yes Harrie Foreman, MD  albuterol (PROVENTIL) (2.5 MG/3ML) 0.083% nebulizer solution Take 3 mLs (2.5 mg total) by nebulization every 4 (  four) hours as needed for wheezing or shortness of breath. 06/06/16  Yes Deneise Lever, MD  amLODipine (NORVASC) 10 MG tablet Take 1 tablet (10 mg total) by mouth daily. 05/03/16  Yes Josalyn Funches, MD  benzonatate (TESSALON) 100 MG capsule Take 1 capsule (100 mg total) by mouth 3 (three) times daily as needed for cough. 05/07/16  Yes Geradine Girt, DO  esomeprazole (NEXIUM) 40 MG capsule Take 1 capsule (40 mg total) by mouth daily. 06/10/16  Yes Deneise Lever, MD  fluticasone (FLONASE) 50 MCG/ACT nasal spray Place 2 sprays into both nostrils daily. 12/27/15  Yes Elsie Stain, MD  guaiFENesin (MUCINEX) 600 MG 12 hr tablet Take 2 tablets (1,200 mg total) by mouth 2 (two) times daily. Patient taking differently: Take 1,200 mg by mouth 2 (two) times daily as needed for cough or to loosen phlegm.  05/07/16  Yes Jessica U Vann, DO  ipratropium (ATROVENT) 0.02 % nebulizer solution Take 2.5 mLs (0.5 mg total) by nebulization 4 (four) times daily as needed for wheezing or shortness of breath. 06/06/16  Yes Deneise Lever, MD  loratadine (CLARITIN) 10 MG tablet Take 1 tablet (10 mg total) by mouth daily. 08/30/15  Yes [name], MD  metFORMIN (GLUCOPHAGE) 500 MG tablet Take 1 tablet (500 mg total) by mouth 2 (two) times daily with a meal. 05/07/16  Yes Jessica U Vann, DO  mometasone-formoterol (DULERA) 200-5 MCG/ACT AERO Inhale 2 puffs into the lungs 2 (two) times daily. 06/06/16  Yes Deneise Lever, MD  nicotine (NICODERM CQ - DOSED IN MG/24 HOURS) 14 mg/24hr patch Place 14 mg onto the skin daily as needed (SMOKING). Wednesdays and Sundays   Yes Historical Provider, MD  sertraline (ZOLOFT) 50 MG tablet Take 1 tablet (50 mg total) by mouth daily. 08/30/15  Yes [name], MD  spironolactone (ALDACTONE) 50 MG tablet Take 2 tablets (100 mg total) by mouth daily. 05/21/16  Yes Josalyn Funches, MD  Tiotropium Bromide Monohydrate (SPIRIVA RESPIMAT) 2.5 MCG/ACT AERS Inhale 2 puffs into the lungs daily. 05/07/16  Yes Geradine Girt, DO  traZODone (DESYREL) 50 MG tablet Take 0.5-1 tablets (25-50 mg total) by mouth at bedtime as needed for sleep. 05/21/16  Yes Josalyn Funches, MD  azithromycin (ZITHROMAX) 500 MG tablet Take 1 tablet (500 mg total) by mouth daily. 06/25/16   Shanker Kristeen Mans, MD  lisinopril (PRINIVIL,ZESTRIL) 40 MG tablet Take 1 tablet (40 mg total) by mouth daily. 06/25/16   Shanker Kristeen Mans, MD  predniSONE (DELTASONE) 10 MG tablet Take 4 tablets (40 mg) daily  for 2 days, then, Take 3 tablets (30 mg) daily for 2 days, then, Take 2 tablets (20 mg) daily for 2 days, then, Take 1 tablets (10 mg) daily for 1 days, then stop 06/25/16   Jonetta Osgood, MD    Family History Family History  Problem Relation Age of Onset  . Hypertension Mother   . Asthma Daughter   . Cancer Paternal Aunt   . Asthma Maternal Grandmother     Social History Social History  Substance Use Topics  . Smoking status: Former Smoker    Years: 18.00    Types: Cigarettes  . Smokeless tobacco: Never Used  . Alcohol use No     Allergies   Patient has no known allergies.   Review of Systems Review of Systems  Constitutional: Negative for fever.  Respiratory: Positive for cough, shortness of breath and wheezing.   All other systems reviewed  and are negative.    Physical Exam Updated Vital Signs BP (!) 173/103 (BP Location: Left Wrist)   Pulse 115   Temp 99.2 F (37.3 C) (Oral)   Resp 24   Ht 5\' 6"  (1.676 m)   Wt (!) 154.2 kg   LMP 04/24/2016   SpO2 99%   BMI 54.88 kg/m   Physical Exam  Constitutional: She is oriented to person, place, and time. She appears well-developed and well-nourished. No distress.  HENT:  Head: Normocephalic and atraumatic.  Eyes: Conjunctivae are normal. No scleral icterus.  Neck: Normal range of motion.  Cardiovascular: Normal rate, regular rhythm and normal heart sounds.  Exam reveals no gallop and no friction rub.   No murmur heard. Pulmonary/Chest: She has wheezes (Diffuse, expiratory).  Tachypnea, increased effort  Abdominal: Soft. Bowel sounds are normal. She exhibits no distension and no mass. There is no tenderness. There is no guarding.  Neurological: She is alert and oriented to person, place, and time.  Skin: Skin is warm and dry. She is not diaphoretic.  Psychiatric: She has a normal mood and affect.  Nursing note and vitals reviewed.    ED Treatments / Results  DIAGNOSTIC STUDIES:  Oxygen Saturation is  100% on RA, normal by my interpretation.    COORDINATION OF CARE:  11:33 AM Discussed treatment plan with pt at bedside and pt agreed to plan.   Labs (all labs ordered are listed, but only abnormal results are displayed) Labs Reviewed  CBC - Abnormal; Notable for the following:       Result Value   WBC 22.9 (*)    Hemoglobin 11.6 (*)    MCH 25.5 (*)    RDW 19.8 (*)    All other components within normal limits  COMPREHENSIVE METABOLIC PANEL - Abnormal; Notable for the following:    Potassium 3.3 (*)    Glucose, Bld 143 (*)    Total Protein 6.4 (*)    Albumin 3.3 (*)    All other components within normal limits  I-STAT ARTERIAL BLOOD GAS, ED - Abnormal; Notable for the following:    Acid-base deficit 4.0 (*)    All other components within normal limits  BRAIN NATRIURETIC PEPTIDE    EKG  EKG Interpretation  Date/Time:  Friday June 21 2016 14:02:07 EST Ventricular Rate:  119 PR Interval:  140 QRS Duration: 86 QT Interval:  334 QTC Calculation: 469 R Axis:   73 Text Interpretation:  Sinus tachycardia Right atrial enlargement Nonspecific ST and T wave abnormality Interpretation limited secondary to artifact Abnormal ECG Confirmed by Glynn Octave [id number]) on 06/22/2016 12:41:44 PM       Radiology No results found.  Procedures Procedures (including critical care time)  Medications Ordered in ED Medications  albuterol (PROVENTIL) (2.5 MG/3ML) 0.083% nebulizer solution 5 mg ( Nebulization Canceled Entry 06/21/16 1331)  albuterol (PROVENTIL,VENTOLIN) solution continuous neb (10 mg/hr Nebulization Given 06/21/16 1143)  magnesium sulfate IVPB 2 g 50 mL (0 g Intravenous Stopped 06/21/16 1249)  methylPREDNISolone sodium succinate (SOLU-MEDROL) 125 mg/2 mL injection 125 mg (125 mg Intravenous Given 06/21/16 1149)  ipratropium-albuterol (DUONEB) 0.5-2.5 (3) MG/3ML nebulizer solution 3 mL (3 mLs Nebulization Given 06/21/16 1418)     Initial Impression / Assessment and  Plan / ED Course  I have reviewed the triage vital signs and the nursing notes.  Pertinent labs & imaging results that were available during my care of the patient were reviewed by me and considered in my medical decision making (  see chart for details).   patient with asthma- + pleural effusions and mild edema. ? New onset CHF or flash edema secondary to hypertension. No previous echos. Although her BNP is normal this may be false secondary to her morbid obesity. She has scant improvement after 2 duonebs, and 1 hour-long neb, magnesium and Solu-Medrol. Patient will be admitted     Final Clinical Impressions(s) / ED Diagnoses   Final diagnoses:  Severe persistent asthma with exacerbation    New Prescriptions Discharge Medication List as of 06/21/2016 10:12 PM     I personally performed the services described in this documentation, which was scribed in my presence. The recorded information has been reviewed and is accurate.       Margarita Mail, PA-C 06/25/16 2134    Duffy Bruce, MD 06/26/16 [phone]

## 2016-06-21 NOTE — ED Notes (Signed)
Admitting MD came down and discussed risks of leaving AMA with pt. Pt verbalized understanding. Pt still wishes to leave AMA.

## 2016-06-21 NOTE — H&P (Signed)
[facility] Admission History and Physical Service Pager: [phone]  Patient name: April [name] Medical record number: [id number] Date of birth: 1970/09/07 Age: 46 y.o. Gender: female  Primary Care Provider: [name], MD Consultants: None Code Status: Full Code  Chief Complaint: shortness of breath  Assessment and Plan: April [name] is a 46 y.o. female current smoker with history of asthma presenting with shortness of breath, not relieved by q2hr asthma in the setting of a recent URI.  PMH is significant for asthma, morbid obesity, essential HTN, OSA? GERD, MDD  Asthma exacerbation:  History of severe persistent asthma not improved with PRN medications at home. Recent URI one week ago with worsening SOB last night with cough productive with yellow sputum. Does have a leukocytosis to 22.9, but takes prednisone 10mg  daily and received solumedrol 125mg  in ED.  Denies fevers, chills or chest pain. Followed by Pulmonologist, Dr. Annamaria Boots. Takes albuterol nebs and atrovent as needed and dulera and xolair (omalizumab) daily. Received solumedrol, duonebs, mag and CAT in ED with some improvement. Satting 98% RA with diffuse wheezing, although no tachypnea. CXR with mild interstitial edema and questionable small bilateral pleural effusions. BNP to 37.3. Patient was sleepy on exam, but AAOx3, stated that she has not been sleeping much due to wheezing.  - admit for observation under attending Chambliss - vitals per floor protocol - duonebs q4hrs scheduled - albuterol q2hrs PRN - CAT if no improvement with albuterol - continue home medication: dulera, spiriva, flonase, claritin - continuous pulse ox - repeat CXR if acutely worsens - prednisone 60mg  x5 days - f/u ABG  Hypertensive urgency:  BP to 196/116 without headaches, changes in vision or chest pain.  Takes amlodipine 10mg  daily and did not take her medication today.   - continue amlodipine 10mg  daily -  hydralazine 5mg  IV PRN  Morbid obesity: BMI >50 with possible history of sleep apnea and reports that she does not use CPAP at home. Sleepy on exam.  Possible history of OHS as well.   - f/u ABG - sleep study when outpatient  MDD: Stable -zoloft 50mg  daily  GERD: Stable - continue protonix 40mg  daily  FEN/GI: regular diet/protonix Prophylaxis: lovenox  Disposition: admit to obs under attending chambliss  History of Present Illness:  April [name] is a 46 y.o. female with history of poorly controlled asthma presenting with SOB worsening over last 24 hours. She reports having no relief with q2hr albuterol and poor sleep due to persistent wheezing. She had a recent URI last week and has progressively been having difficulties breathing.  Denies any body aches, chills or fevers.  Denies headaches, changes in vision, numbness, tingling in extremities. In ED received mag, solumedrol, duonebs and CAT with some improvement.   Review Of Systems: Per HPI   ROS  Patient Active Problem List   Diagnosis Date Noted  . Malignant hypertension due to primary aldosteronism ([place]) 05/05/2016  . Lip laceration 05/05/2016  . Elevated hemoglobin A1c 05/05/2016  . Asthma exacerbation 11/10/2015  . GERD (gastroesophageal reflux disease) 08/30/2015  . Generalized anxiety disorder 08/30/2015  . Seasonal allergic rhinitis 08/29/2013  . Asthma, chronic obstructive, without status asthmaticus ([place]) 05/07/2012  . Knee pain, bilateral 04/25/2011  . Primary insomnia 03/14/2011  . Obstructive sleep apnea 12/19/2010  . Hypokalemia 08/13/2010  . Cervical back pain with evidence of disc disease 04/08/2008  . Essential hypertension 07/31/2006  . Morbid obesity with body mass index of 50.0-59.9 in adult ([place]) 06/17/2006  .  Major depressive disorder, recurrent episode ([place]) 04/10/2006    Past Medical History: Past Medical History:  Diagnosis Date  . Acanthosis nigricans   . Arthritis   . Asthma    Followed by  Dr. Annamaria Boots (pulmonology); receives every other week omalizumab injections; has frequent exacerbations  . COPD (chronic obstructive pulmonary disease) ([place])    PFTs in 2002, FEV1/FVC 65, no post bronchodilater test done  . Depression   . GERD (gastroesophageal reflux disease)   . Headache(784.0)   . Helicobacter pylori (H. pylori) infection   . Hypertension, essential   . Insomnia   . Menorrhagia   . Morbid obesity ([place])   . Obesity   . Seasonal allergies   . Shortness of breath   . Sleep apnea    Sleep study 2008 - mild OSA, not enough events to titrate CPAP  . Tobacco user     Past Surgical History: Past Surgical History:  Procedure Laterality Date  . BREAST REDUCTION SURGERY  09/2011  . TUBAL LIGATION  1996   bilateral    Social History: Social History  Substance Use Topics  . Smoking status: Former Smoker    Years: 18.00    Types: Cigarettes  . Smokeless tobacco: Never Used  . Alcohol use No    Family History: Family History  Problem Relation Age of Onset  . Hypertension Mother   . Asthma Daughter   . Cancer Paternal Aunt   . Asthma Maternal Grandmother     Allergies and Medications: No Known Allergies Current Facility-Administered Medications on File Prior to Encounter  Medication Dose Route Frequency Provider Last Rate Last Dose  . omalizumab Arvid Right) injection 225 mg  225 mg Subcutaneous Q14 Days Deneise Lever, MD   225 mg at 03/27/16 1156  . omalizumab Arvid Right) injection 225 mg  225 mg Subcutaneous Q14 Days Deneise Lever, MD   225 mg at 05/09/16 1508  . omalizumab Arvid Right) injection 225 mg  225 mg Subcutaneous Q14 Days Deneise Lever, MD   225 mg at 06/07/16 1006   Current Outpatient Prescriptions on File Prior to Encounter  Medication Sig Dispense Refill  . acetaminophen (TYLENOL) 500 MG tablet Take 2 tablets (1,000 mg total) by mouth every 6 (six) hours as needed. (Patient not taking: Reported on 05/21/2016) 90 tablet 3  . albuterol (PROAIR HFA)  108 (90 Base) MCG/ACT inhaler Inhale 1-2 puffs into the lungs every 6 (six) hours as needed for wheezing or shortness of breath. 1 Inhaler 2  . albuterol (PROVENTIL) (2.5 MG/3ML) 0.083% nebulizer solution Take 3 mLs (2.5 mg total) by nebulization every 4 (four) hours as needed for wheezing or shortness of breath. 150 mL 2  . amLODipine (NORVASC) 10 MG tablet Take 1 tablet (10 mg total) by mouth daily. 30 tablet 2  . benzonatate (TESSALON) 100 MG capsule Take 1 capsule (100 mg total) by mouth 3 (three) times daily as needed for cough. 20 capsule 0  . cetirizine-pseudoephedrine (ZYRTEC-D) 5-120 MG tablet Take 1 tablet by mouth daily. 30 tablet 0  . esomeprazole (NEXIUM) 40 MG capsule Take 1 capsule (40 mg total) by mouth daily. 30 capsule 12  . fluticasone (FLONASE) 50 MCG/ACT nasal spray Place 2 sprays into both nostrils daily. 16 g 6  . guaiFENesin (MUCINEX) 600 MG 12 hr tablet Take 2 tablets (1,200 mg total) by mouth 2 (two) times daily. 28 tablet 0  . ibuprofen (ADVIL,MOTRIN) 200 MG tablet Take 600 mg by mouth 2 (two) times daily as  needed for moderate pain (knee pain).     [name] ipratropium (ATROVENT) 0.02 % nebulizer solution Take 2.5 mLs (0.5 mg total) by nebulization 4 (four) times daily as needed for wheezing or shortness of breath. 150 mL 2  . loratadine (CLARITIN) 10 MG tablet Take 1 tablet (10 mg total) by mouth daily. 90 tablet 3  . metFORMIN (GLUCOPHAGE) 500 MG tablet Take 1 tablet (500 mg total) by mouth 2 (two) times daily with a meal. 60 tablet 0  . mometasone-formoterol (DULERA) 200-5 MCG/ACT AERO Inhale 2 puffs into the lungs 2 (two) times daily. 1 Inhaler 2  . nicotine (NICODERM CQ - DOSED IN MG/24 HOURS) 14 mg/24hr patch Place 14 mg onto the skin 2 (two) times a week. Wednesdays and Sundays    . predniSONE (DELTASONE) 10 MG tablet 40mg X2 days, 30mg  X2 days, 20mg  X2 days, 10mg X2 days, then 10mg  qod. 100 tablet 0  . sertraline (ZOLOFT) 50 MG tablet Take 1 tablet (50 mg total) by mouth  daily. 90 tablet 3  . spironolactone (ALDACTONE) 50 MG tablet Take 2 tablets (100 mg total) by mouth daily. 60 tablet 2  . Tiotropium Bromide Monohydrate (SPIRIVA RESPIMAT) 2.5 MCG/ACT AERS Inhale 2 puffs into the lungs daily. 1 Inhaler 0  . traZODone (DESYREL) 50 MG tablet Take 1 tablet (50 mg total) by mouth at bedtime. 30 tablet 0  . traZODone (DESYREL) 50 MG tablet Take 0.5-1 tablets (25-50 mg total) by mouth at bedtime as needed for sleep. 30 tablet 3    Objective: BP (!) 196/116   Pulse 113   Temp 99.2 F (37.3 C) (Oral)   Resp 24   Ht 5\' 6"  (1.676 m)   Wt (!) 340 lb (154.2 kg)   LMP 04/24/2016   SpO2 100%   BMI 54.88 kg/m  Exam: General: 46yo F sitting up in bed appearing uncomfortable, but in NAD Eyes: EOMI, PERRL, non-injected, anicteric ENTM: no nasal drainage, clear oropharynx, MMM Neck: supple, no LAD Cardiovascular: RRR, no MRG, 2+ radial pulses Respiratory: Difficulty speaking in full sentences, audible wheezing, diffuse wheezing throughout, no rhonchi or crackles Gastrointestinal: obese abdomen, soft, non-tender, non-distended MSK: no gross deformities or edema, FROM Derm: warm and dry, no new rashes Neuro: CN2-10 WNL, no changes in sensation, 5/5 strength throughout, AAOx3 Psych: normal mood and affect  Labs and Imaging: CBC BMET   Recent Labs Lab 06/21/16 1136  WBC 22.9*  HGB 11.6*  HCT 37.7  PLT 370    Recent Labs Lab 06/21/16 1143  NA 139  K 3.3*  CL 104  CO2 26  BUN 9  CREATININE 0.78  GLUCOSE 143*  CALCIUM 8.9     Dg Chest Portable 1 View  Result Date: 06/21/2016 CLINICAL DATA:  Shortness of breath . EXAM: PORTABLE CHEST 1 VIEW COMPARISON:  05/09/2016. FINDINGS: Cardiomegaly with mild bilateral interstitial prominence suggesting mild CHF. Small bilateral pleural effusions cannot be excluded. No pneumothorax . IMPRESSION: Cardiomegaly with very mild interstitial prominence suggesting mild interstitial edema. Small bilateral pleural  effusions cannot be excluded . Electronically Signed   By: Marcello Moores  Register   On: 06/21/2016 12:33    Eloise Levels, MD 06/21/2016, 3:50 PM PGY-1, [place] Intern pager: [phone], text pages welcome

## 2016-06-21 NOTE — ED Notes (Signed)
Pt ambulated to the bathroom with minimal assistance

## 2016-06-21 NOTE — ED Notes (Signed)
Pt now requesting to speak to MD about getting a prescription for prednisone and going home. Pt does not wish to stay in the hospital. Admitting MD paged.

## 2016-06-21 NOTE — Progress Notes (Signed)
Spoke with patient. She is wanting to leave AMA. The risk were discussed and benefits of staying. My medical advice was for patient to stay overnight for observation since she came in and required CAT and had desaturations. However she states she feels 100% better and will just come back to hospital if needed. ED nurse made aware.   Luiz Blare, DO 06/21/2016, 8:51 PM PGY-3, [place]

## 2016-06-21 NOTE — ED Triage Notes (Signed)
Pt presents with audible wheezing and reports asthma exacerbation for a couple of days but worsened today which prompted her ER visit. Home neb txt and inhaler has not helped her with this exacerbation.

## 2016-06-22 ENCOUNTER — Encounter (HOSPITAL_COMMUNITY): Payer: Self-pay | Admitting: Emergency Medicine

## 2016-06-22 ENCOUNTER — Inpatient Hospital Stay (HOSPITAL_COMMUNITY)
Admission: EM | Admit: 2016-06-22 | Discharge: 2016-06-25 | DRG: 202 | Disposition: A | Discharge status: Home / Self Care | Payer: Medicaid Other | Attending: Internal Medicine | Admitting: Internal Medicine

## 2016-06-22 DIAGNOSIS — J4551 Severe persistent asthma with (acute) exacerbation: Secondary | ICD-10-CM

## 2016-06-22 DIAGNOSIS — J811 Chronic pulmonary edema: Secondary | ICD-10-CM | POA: Diagnosis present

## 2016-06-22 DIAGNOSIS — Z79899 Other long term (current) drug therapy: Secondary | ICD-10-CM

## 2016-06-22 DIAGNOSIS — I517 Cardiomegaly: Secondary | ICD-10-CM | POA: Diagnosis present

## 2016-06-22 DIAGNOSIS — J449 Chronic obstructive pulmonary disease, unspecified: Secondary | ICD-10-CM

## 2016-06-22 DIAGNOSIS — J440 Chronic obstructive pulmonary disease with acute lower respiratory infection: Secondary | ICD-10-CM | POA: Diagnosis present

## 2016-06-22 DIAGNOSIS — I119 Hypertensive heart disease without heart failure: Secondary | ICD-10-CM | POA: Diagnosis present

## 2016-06-22 DIAGNOSIS — E2609 Other primary hyperaldosteronism: Secondary | ICD-10-CM | POA: Diagnosis present

## 2016-06-22 DIAGNOSIS — I160 Hypertensive urgency: Secondary | ICD-10-CM | POA: Diagnosis present

## 2016-06-22 DIAGNOSIS — G4733 Obstructive sleep apnea (adult) (pediatric): Secondary | ICD-10-CM | POA: Diagnosis present

## 2016-06-22 DIAGNOSIS — I152 Hypertension secondary to endocrine disorders: Secondary | ICD-10-CM

## 2016-06-22 DIAGNOSIS — E876 Hypokalemia: Secondary | ICD-10-CM | POA: Diagnosis present

## 2016-06-22 DIAGNOSIS — Z8249 Family history of ischemic heart disease and other diseases of the circulatory system: Secondary | ICD-10-CM

## 2016-06-22 DIAGNOSIS — Z7951 Long term (current) use of inhaled steroids: Secondary | ICD-10-CM

## 2016-06-22 DIAGNOSIS — K219 Gastro-esophageal reflux disease without esophagitis: Secondary | ICD-10-CM | POA: Diagnosis present

## 2016-06-22 DIAGNOSIS — Z791 Long term (current) use of non-steroidal anti-inflammatories (NSAID): Secondary | ICD-10-CM

## 2016-06-22 DIAGNOSIS — F17200 Nicotine dependence, unspecified, uncomplicated: Secondary | ICD-10-CM | POA: Diagnosis present

## 2016-06-22 DIAGNOSIS — J810 Acute pulmonary edema: Secondary | ICD-10-CM

## 2016-06-22 DIAGNOSIS — Z9989 Dependence on other enabling machines and devices: Secondary | ICD-10-CM

## 2016-06-22 DIAGNOSIS — Z825 Family history of asthma and other chronic lower respiratory diseases: Secondary | ICD-10-CM

## 2016-06-22 DIAGNOSIS — J189 Pneumonia, unspecified organism: Secondary | ICD-10-CM | POA: Diagnosis present

## 2016-06-22 DIAGNOSIS — J302 Other seasonal allergic rhinitis: Secondary | ICD-10-CM

## 2016-06-22 DIAGNOSIS — F329 Major depressive disorder, single episode, unspecified: Secondary | ICD-10-CM | POA: Diagnosis present

## 2016-06-22 DIAGNOSIS — Z6841 Body Mass Index (BMI) 40.0 and over, adult: Secondary | ICD-10-CM

## 2016-06-22 DIAGNOSIS — Z7984 Long term (current) use of oral hypoglycemic drugs: Secondary | ICD-10-CM

## 2016-06-22 LAB — CBG MONITORING, ED: Glucose-Capillary: 314 mg/dL — ABNORMAL HIGH (ref 65–99)

## 2016-06-22 MED ORDER — PREDNISONE 20 MG PO TABS
60.0000 mg | ORAL_TABLET | Freq: Once | ORAL | Status: AC
  Administered 2016-06-22: 60 mg via ORAL
  Filled 2016-06-22: qty 3

## 2016-06-22 MED ORDER — METFORMIN HCL 500 MG PO TABS
500.0000 mg | ORAL_TABLET | Freq: Two times a day (BID) | ORAL | Status: DC
  Filled 2016-06-22: qty 1

## 2016-06-22 MED ORDER — ALBUTEROL SULFATE (2.5 MG/3ML) 0.083% IN NEBU
2.5000 mg | INHALATION_SOLUTION | RESPIRATORY_TRACT | Status: DC | PRN
  Administered 2016-06-22: 2.5 mg via RESPIRATORY_TRACT
  Filled 2016-06-22: qty 3

## 2016-06-22 MED ORDER — NICOTINE 14 MG/24HR TD PT24: 14.0000 mg | MEDICATED_PATCH | Freq: Every day | TRANSDERMAL | Status: DC | PRN

## 2016-06-22 MED ORDER — NICOTINE 7 MG/24HR TD PT24: 7.0000 mg | MEDICATED_PATCH | Freq: Every day | TRANSDERMAL | Status: DC

## 2016-06-22 MED ORDER — TIOTROPIUM BROMIDE MONOHYDRATE 18 MCG IN CAPS
1.0000 | ORAL_CAPSULE | Freq: Every day | RESPIRATORY_TRACT | Status: DC
  Administered 2016-06-23: 18 ug via RESPIRATORY_TRACT
  Filled 2016-06-22: qty 5

## 2016-06-22 MED ORDER — IPRATROPIUM BROMIDE 0.02 % IN SOLN
0.5000 mg | Freq: Once | RESPIRATORY_TRACT | Status: AC
  Administered 2016-06-22: 0.5 mg via RESPIRATORY_TRACT
  Filled 2016-06-22: qty 2.5

## 2016-06-22 MED ORDER — HYDRALAZINE HCL 20 MG/ML IJ SOLN
10.0000 mg | INTRAMUSCULAR | Status: DC | PRN
  Administered 2016-06-22: 20 mg via INTRAVENOUS
  Filled 2016-06-22: qty 1

## 2016-06-22 MED ORDER — AMLODIPINE BESYLATE 5 MG PO TABS: 10.0000 mg | ORAL_TABLET | Freq: Every day | ORAL | Status: DC

## 2016-06-22 MED ORDER — ALBUTEROL SULFATE HFA 108 (90 BASE) MCG/ACT IN AERS: 1.0000 | INHALATION_SPRAY | Freq: Four times a day (QID) | RESPIRATORY_TRACT | Status: DC | PRN

## 2016-06-22 MED ORDER — SPIRONOLACTONE 100 MG PO TABS: 100.0000 mg | ORAL_TABLET | Freq: Every day | ORAL | Status: DC

## 2016-06-22 MED ORDER — LISINOPRIL 10 MG PO TABS
10.0000 mg | ORAL_TABLET | Freq: Every day | ORAL | Status: DC
  Administered 2016-06-23: 10 mg via ORAL
  Filled 2016-06-22: qty 1

## 2016-06-22 MED ORDER — PREDNISONE 20 MG PO TABS: 40.0000 mg | ORAL_TABLET | Freq: Every day | ORAL | Status: DC

## 2016-06-22 MED ORDER — PREDNISONE 20 MG PO TABS
50.0000 mg | ORAL_TABLET | Freq: Every day | ORAL | Status: DC
  Filled 2016-06-22: qty 2

## 2016-06-22 MED ORDER — ACETAMINOPHEN 500 MG PO TABS
1000.0000 mg | ORAL_TABLET | Freq: Four times a day (QID) | ORAL | Status: DC | PRN
  Administered 2016-06-24 (×2): 1000 mg via ORAL
  Filled 2016-06-22 (×2): qty 2

## 2016-06-22 MED ORDER — PANTOPRAZOLE SODIUM 40 MG PO TBEC
80.0000 mg | DELAYED_RELEASE_TABLET | Freq: Every day | ORAL | Status: DC
  Administered 2016-06-23 – 2016-06-25 (×3): 80 mg via ORAL
  Filled 2016-06-22 (×3): qty 2

## 2016-06-22 MED ORDER — IPRATROPIUM BROMIDE 0.02 % IN SOLN
0.5000 mg | Freq: Four times a day (QID) | RESPIRATORY_TRACT | Status: DC | PRN
  Administered 2016-06-23: 0.5 mg via RESPIRATORY_TRACT
  Filled 2016-06-22 (×2): qty 2.5

## 2016-06-22 MED ORDER — MAGNESIUM SULFATE 2 GM/50ML IV SOLN
2.0000 g | Freq: Once | INTRAVENOUS | Status: AC
  Administered 2016-06-22: 2 g via INTRAVENOUS
  Filled 2016-06-22: qty 50

## 2016-06-22 MED ORDER — ALBUTEROL SULFATE (2.5 MG/3ML) 0.083% IN NEBU
2.5000 mg | INHALATION_SOLUTION | RESPIRATORY_TRACT | Status: DC | PRN
  Administered 2016-06-23 – 2016-06-25 (×5): 2.5 mg via RESPIRATORY_TRACT
  Filled 2016-06-22 (×5): qty 3

## 2016-06-22 MED ORDER — TRAZODONE HCL 50 MG PO TABS
25.0000 mg | ORAL_TABLET | Freq: Every evening | ORAL | Status: DC | PRN
  Administered 2016-06-22 – 2016-06-24 (×3): 50 mg via ORAL
  Filled 2016-06-22 (×3): qty 1

## 2016-06-22 MED ORDER — LISINOPRIL 10 MG PO TABS: 10.0000 mg | ORAL_TABLET | Freq: Every day | ORAL | Status: DC

## 2016-06-22 MED ORDER — POTASSIUM CHLORIDE CRYS ER 20 MEQ PO TBCR
20.0000 meq | EXTENDED_RELEASE_TABLET | Freq: Once | ORAL | Status: AC
  Administered 2016-06-22: 20 meq via ORAL
  Filled 2016-06-22: qty 1

## 2016-06-22 MED ORDER — SODIUM CHLORIDE 0.9% FLUSH
3.0000 mL | Freq: Two times a day (BID) | INTRAVENOUS | Status: DC
  Administered 2016-06-22 – 2016-06-24 (×5): 3 mL via INTRAVENOUS

## 2016-06-22 MED ORDER — AMLODIPINE BESYLATE 5 MG PO TABS
10.0000 mg | ORAL_TABLET | Freq: Once | ORAL | Status: AC
  Administered 2016-06-22: 10 mg via ORAL
  Filled 2016-06-22: qty 2

## 2016-06-22 MED ORDER — IBUPROFEN 600 MG PO TABS: 600.0000 mg | ORAL_TABLET | Freq: Two times a day (BID) | ORAL | Status: DC | PRN

## 2016-06-22 MED ORDER — GUAIFENESIN ER 600 MG PO TB12
1200.0000 mg | ORAL_TABLET | Freq: Two times a day (BID) | ORAL | Status: DC | PRN
  Administered 2016-06-22 – 2016-06-24 (×4): 1200 mg via ORAL
  Filled 2016-06-22 (×4): qty 2

## 2016-06-22 MED ORDER — FUROSEMIDE 10 MG/ML IJ SOLN
40.0000 mg | INTRAMUSCULAR | Status: AC
  Administered 2016-06-22: 40 mg via INTRAVENOUS
  Filled 2016-06-22: qty 4

## 2016-06-22 MED ORDER — HYDROCHLOROTHIAZIDE 25 MG PO TABS
25.0000 mg | ORAL_TABLET | Freq: Every day | ORAL | Status: DC
  Administered 2016-06-23: 25 mg via ORAL
  Filled 2016-06-22: qty 1

## 2016-06-22 MED ORDER — BENZONATATE 100 MG PO CAPS
100.0000 mg | ORAL_CAPSULE | Freq: Three times a day (TID) | ORAL | Status: DC | PRN
  Administered 2016-06-23: 100 mg via ORAL
  Filled 2016-06-22: qty 1

## 2016-06-22 MED ORDER — HYDRALAZINE HCL 20 MG/ML IJ SOLN
5.0000 mg | INTRAMUSCULAR | Status: DC | PRN
  Administered 2016-06-22: 5 mg via INTRAVENOUS
  Filled 2016-06-22: qty 1

## 2016-06-22 MED ORDER — SPIRONOLACTONE 25 MG PO TABS
100.0000 mg | ORAL_TABLET | Freq: Every day | ORAL | Status: DC
  Administered 2016-06-22 – 2016-06-25 (×4): 100 mg via ORAL
  Filled 2016-06-22: qty 1
  Filled 2016-06-22 (×3): qty 4

## 2016-06-22 MED ORDER — ENOXAPARIN SODIUM 80 MG/0.8ML ~~LOC~~ SOLN
75.0000 mg | SUBCUTANEOUS | Status: DC
  Administered 2016-06-23 – 2016-06-25 (×3): 75 mg via SUBCUTANEOUS
  Filled 2016-06-22 (×4): qty 0.8

## 2016-06-22 MED ORDER — AMLODIPINE BESYLATE 10 MG PO TABS
10.0000 mg | ORAL_TABLET | Freq: Every day | ORAL | Status: DC
  Administered 2016-06-24 – 2016-06-25 (×2): 10 mg via ORAL
  Filled 2016-06-22 (×2): qty 1

## 2016-06-22 MED ORDER — MOMETASONE FURO-FORMOTEROL FUM 200-5 MCG/ACT IN AERO
2.0000 | INHALATION_SPRAY | Freq: Two times a day (BID) | RESPIRATORY_TRACT | Status: DC
  Administered 2016-06-23 (×2): 2 via RESPIRATORY_TRACT
  Filled 2016-06-22: qty 8.8

## 2016-06-22 MED ORDER — SERTRALINE HCL 50 MG PO TABS
50.0000 mg | ORAL_TABLET | Freq: Every day | ORAL | Status: DC
  Administered 2016-06-23 – 2016-06-25 (×3): 50 mg via ORAL
  Filled 2016-06-22 (×3): qty 1

## 2016-06-22 MED ORDER — FLUTICASONE PROPIONATE 50 MCG/ACT NA SUSP
2.0000 | Freq: Every day | NASAL | Status: DC
  Administered 2016-06-23 – 2016-06-25 (×3): 2 via NASAL
  Filled 2016-06-22: qty 16

## 2016-06-22 MED ORDER — ALBUTEROL (5 MG/ML) CONTINUOUS INHALATION SOLN
10.0000 mg/h | INHALATION_SOLUTION | Freq: Once | RESPIRATORY_TRACT | Status: AC
  Administered 2016-06-22: 10 mg/h via RESPIRATORY_TRACT
  Filled 2016-06-22: qty 20

## 2016-06-22 MED ORDER — PANTOPRAZOLE SODIUM 40 MG PO TBEC: 40.0000 mg | DELAYED_RELEASE_TABLET | Freq: Every day | ORAL | Status: DC

## 2016-06-22 MED ORDER — LORATADINE 10 MG PO TABS
10.0000 mg | ORAL_TABLET | Freq: Every day | ORAL | Status: DC
  Administered 2016-06-23 – 2016-06-25 (×3): 10 mg via ORAL
  Filled 2016-06-22 (×3): qty 1

## 2016-06-22 MED ORDER — NICOTINE 14 MG/24HR TD PT24
14.0000 mg | MEDICATED_PATCH | Freq: Every day | TRANSDERMAL | Status: DC
  Administered 2016-06-22 – 2016-06-25 (×4): 14 mg via TRANSDERMAL
  Filled 2016-06-22 (×4): qty 1

## 2016-06-22 NOTE — ED Triage Notes (Signed)
Pt c/o asthma symptoms ongoing for 1 week. Pt reports that she was supposed to be admitted yesterday when she was here but she wanted to go home and take a shower.

## 2016-06-22 NOTE — H&P (Signed)
History and Physical    April [name] [id number] DOB: 1970-05-01 DOA: 06/22/2016   PCP: [name], MD Chief Complaint:  Chief Complaint  Patient presents with  . Asthma    HPI: April [name] is a 46 y.o. female with medical history significant of asthma, HTN, OSA, morbid obesity.  Patient is on Xolair for asthma, was supposed to get dose yesterday but missed office appointment due to being in ED / hospital for acute exacerbation.  She was to be admitted to the hospital but left AMA after she felt better with steroids.  Steroids were not prescribed for the patient, and she didn't take her BP meds this morning.  Unfortunately the patient presents back to the ED with c/o SOB, cough, wheezing.  Hasnt slept well at night she says.  No fevers.  ED Course: Neb treatment and prednisone PO given.  Patient BP running very high.  Given amlodipine.  Still working hard to breath.  Review of Systems: As per HPI otherwise 10 point review of systems negative.    Past Medical History:  Diagnosis Date  . Acanthosis nigricans   . Arthritis   . Asthma    Followed by Dr. Annamaria Boots (pulmonology); receives every other week omalizumab injections; has frequent exacerbations  . COPD (chronic obstructive pulmonary disease) ([place])    PFTs in 2002, FEV1/FVC 65, no post bronchodilater test done  . Depression   . GERD (gastroesophageal reflux disease)   . Headache(784.0)   . Helicobacter pylori (H. pylori) infection   . Hypertension, essential   . Insomnia   . Menorrhagia   . Morbid obesity (April [name])   . Obesity   . Seasonal allergies   . Shortness of breath   . Sleep apnea    Sleep study 2008 - mild OSA, not enough events to titrate CPAP  . Tobacco user     Past Surgical History:  Procedure Laterality Date  . BREAST REDUCTION SURGERY  09/2011  . TUBAL LIGATION  1996   bilateral     reports that she has quit smoking. Her smoking use included Cigarettes. She quit after 18.00 years of use. She  has never used smokeless tobacco. She reports that she does not drink alcohol or use drugs.  No Known Allergies  Family History  Problem Relation Age of Onset  . Hypertension Mother   . Asthma Daughter   . Cancer Paternal Aunt   . Asthma Maternal Grandmother       Prior to Admission medications   Medication Sig Start Date End Date Taking? Authorizing Provider  acetaminophen (TYLENOL) 500 MG tablet Take 2 tablets (1,000 mg total) by mouth every 6 (six) hours as needed. 08/30/15   [name], MD  albuterol [facility] HFA) [phone] Base) MCG/ACT inhaler Inhale 1-2 puffs into the lungs every 6 (six) hours as needed for wheezing or shortness of breath. 06/18/16   Harrie Foreman, MD  albuterol (PROVENTIL) (2.5 MG/3ML) 0.083% nebulizer solution Take 3 mLs (2.5 mg total) by nebulization every 4 (four) hours as needed for wheezing or shortness of breath. 06/06/16   Deneise Lever, MD  amLODipine (NORVASC) 10 MG tablet Take 1 tablet (10 mg total) by mouth daily. 05/03/16   Josalyn Funches, MD  benzonatate (TESSALON) 100 MG capsule Take 1 capsule (100 mg total) by mouth 3 (three) times daily as needed for cough. 05/07/16   Geradine Girt, DO  esomeprazole (NEXIUM) 40 MG capsule Take 1 capsule (40 mg total) by mouth  daily. 06/10/16   Deneise Lever, MD  fluticasone (FLONASE) 50 MCG/ACT nasal spray Place 2 sprays into both nostrils daily. 12/27/15   Elsie Stain, MD  guaiFENesin (MUCINEX) 600 MG 12 hr tablet Take 2 tablets (1,200 mg total) by mouth 2 (two) times daily. Patient taking differently: Take 1,200 mg by mouth 2 (two) times daily as needed for cough or to loosen phlegm.  05/07/16   Geradine Girt, DO  hydrochlorothiazide (HYDRODIURIL) 25 MG tablet Take 25 mg by mouth daily. 03/25/16   Historical Provider, MD  ibuprofen (ADVIL,MOTRIN) 200 MG tablet Take 600 mg by mouth 2 (two) times daily as needed for moderate pain (knee pain).     Historical Provider, MD  ipratropium (ATROVENT) 0.02 % nebulizer  solution Take 2.5 mLs (0.5 mg total) by nebulization 4 (four) times daily as needed for wheezing or shortness of breath. 06/06/16   Deneise Lever, MD  lisinopril (PRINIVIL,ZESTRIL) 10 MG tablet Take 10 mg by mouth daily. 03/27/16   Historical Provider, MD  loratadine (CLARITIN) 10 MG tablet Take 1 tablet (10 mg total) by mouth daily. 08/30/15   [name], MD  metFORMIN (GLUCOPHAGE) 500 MG tablet Take 1 tablet (500 mg total) by mouth 2 (two) times daily with a meal. 05/07/16   Geradine Girt, DO  mometasone-formoterol (DULERA) 200-5 MCG/ACT AERO Inhale 2 puffs into the lungs 2 (two) times daily. 06/06/16   Deneise Lever, MD  nicotine (NICODERM CQ - DOSED IN MG/24 HOURS) 14 mg/24hr patch Place 14 mg onto the skin daily as needed (SMOKING). Wednesdays and Sundays    Historical Provider, MD  sertraline (ZOLOFT) 50 MG tablet Take 1 tablet (50 mg total) by mouth daily. 08/30/15   [name], MD  spironolactone (ALDACTONE) 50 MG tablet Take 2 tablets (100 mg total) by mouth daily. 05/21/16   Josalyn Funches, MD  Tiotropium Bromide Monohydrate (SPIRIVA RESPIMAT) 2.5 MCG/ACT AERS Inhale 2 puffs into the lungs daily. 05/07/16   Geradine Girt, DO  traZODone (DESYREL) 50 MG tablet Take 0.5-1 tablets (25-50 mg total) by mouth at bedtime as needed for sleep. 05/21/16   Boykin Nearing, MD    Physical Exam: Vitals:   06/22/16 1815 06/22/16 1900 06/22/16 1951 06/22/16 2015  BP: (!) 212/117 (!) 201/112 (!) 181/108 (!) 206/112  Pulse: 120   107  Resp: 25 (!) 29    Temp:      TempSrc:      SpO2: 95%   100%  Weight:      Height:          Constitutional: NAD, calm, comfortable Eyes: PERRL, lids and conjunctivae normal ENMT: Mucous membranes are moist. Posterior pharynx clear of any exudate or lesions.Normal dentition.  Neck: normal, supple, no masses, no thyromegaly Respiratory: clear to auscultation bilaterally, no wheezing, no crackles. Normal respiratory effort. No accessory muscle use.  Cardiovascular:  Regular rate and rhythm, no murmurs / rubs / gallops. No extremity edema. 2+ pedal pulses. No carotid bruits.  Abdomen: no tenderness, no masses palpated. No hepatosplenomegaly. Bowel sounds positive.  Musculoskeletal: no clubbing / cyanosis. No joint deformity upper and lower extremities. Good ROM, no contractures. Normal muscle tone.  Skin: no rashes, lesions, ulcers. No induration Neurologic: CN 2-12 grossly intact. Sensation intact, DTR normal. Strength 5/5 in all 4.  Psychiatric: Normal judgment and insight. Alert and oriented x 3. Normal mood.    Labs on Admission: I have personally reviewed following labs and imaging studies  CBC:  Recent Labs Lab 06/21/16 1136  WBC 22.9*  HGB 11.6*  HCT 37.7  MCV 82.9  PLT 0000000   Basic Metabolic Panel:  Recent Labs Lab 06/21/16 1143  NA 139  K 3.3*  CL 104  CO2 26  GLUCOSE 143*  BUN 9  CREATININE 0.78  CALCIUM 8.9   GFR: Estimated Creatinine Clearance: 139.3 mL/min (by C-G formula based on SCr of 0.78 mg/dL). Liver Function Tests:  Recent Labs Lab 06/21/16 1143  AST 19  ALT 20  ALKPHOS 60  BILITOT 0.5  PROT 6.4*  ALBUMIN 3.3*   No results for input(s): LIPASE, AMYLASE in the last 168 hours. No results for input(s): AMMONIA in the last 168 hours. Coagulation Profile: No results for input(s): INR, PROTIME in the last 168 hours. Cardiac Enzymes: No results for input(s): CKTOTAL, CKMB, CKMBINDEX, TROPONINI in the last 168 hours. BNP (last 3 results) No results for input(s): PROBNP in the last 8760 hours. HbA1C: No results for input(s): HGBA1C in the last 72 hours. CBG: No results for input(s): GLUCAP in the last 168 hours. Lipid Profile: No results for input(s): CHOL, HDL, LDLCALC, TRIG, CHOLHDL, LDLDIRECT in the last 72 hours. Thyroid Function Tests: No results for input(s): TSH, T4TOTAL, FREET4, T3FREE, THYROIDAB in the last 72 hours. Anemia Panel: No results for input(s): VITAMINB12, FOLATE, FERRITIN, TIBC,  IRON, RETICCTPCT in the last 72 hours. Urine analysis:    Component Value Date/Time   COLORURINE YELLOW 11/21/2014 0707   APPEARANCEUR CLOUDY (A) 11/21/2014 0707   LABSPEC 1.024 11/21/2014 0707   PHURINE 6.0 11/21/2014 0707   GLUCOSEU NEGATIVE 11/21/2014 0707   GLUCOSEU NEG mg/dL 10/28/2007 2049   HGBUR TRACE (A) 11/21/2014 0707   BILIRUBINUR NEGATIVE 11/21/2014 0707   KETONESUR NEGATIVE 11/21/2014 0707   PROTEINUR NEGATIVE 11/21/2014 0707   UROBILINOGEN 1.0 11/21/2014 0707   NITRITE NEGATIVE 11/21/2014 0707   LEUKOCYTESUR NEGATIVE 11/21/2014 0707   Sepsis Labs: @LABRCNTIP (procalcitonin:4,lacticidven:4) )No results found for this or any previous visit (from the past 240 hour(s)).   Radiological Exams on Admission: Dg Chest Portable 1 View  Result Date: 06/21/2016 CLINICAL DATA:  Shortness of breath . EXAM: PORTABLE CHEST 1 VIEW COMPARISON:  05/09/2016. FINDINGS: Cardiomegaly with mild bilateral interstitial prominence suggesting mild CHF. Small bilateral pleural effusions cannot be excluded. No pneumothorax . IMPRESSION: Cardiomegaly with very mild interstitial prominence suggesting mild interstitial edema. Small bilateral pleural effusions cannot be excluded . Electronically Signed   By: Marcello Moores  Register   On: 06/21/2016 12:33    EKG: Independently reviewed.  Assessment/Plan Principal Problem:   Asthma exacerbation Active Problems:   Obstructive sleep apnea   Asthma, chronic obstructive, without status asthmaticus (HCC)   Malignant hypertension due to primary aldosteronism (HCC)   Hypertensive urgency, malignant   Pulmonary edema   Hypertensive urgency    1. Asthma exacerbation - Suspect that this is set off by HTN urgency and pulmonary edema seen on CXR 1. Prednisone 2. Adult wheeze protocol 3. Continue home nebs 4. Continuous pulse ox 5. Tele monitor 6. Nicotine patch - Stressed importance of quitting smoking completely! 2. Malignant HTN and primary aldosteronism  - with hypertensive urgency and pulmonary edema 1. Continue lisinopril and HCTZ in AM 2. Lasix 40mg  IV now 3. Got Norvasc already in ED, will write for next dose of this to be given on Mon 4. Hydralazine 10-20mg  IV Q4H PRN 5. Spironolactone now and resume daily dosing in AM tomorrow 6. Daily BMP to monitor renal function and potassium 7. Stressed  importance of taking BP meds every day to patient! 3. Hypokalemia - 52meq Kdur PO x1, monitor with daily BMP 4. Likely OSA - will put patient on empiric CPAP QHS   DVT prophylaxis: Lovenox Code Status: Full Family Communication: Family at bedside Consults called: None Admission status: Admit to obs   [place], [place] [facility] Pager [phone] from 7PM-7AM  If 7AM-7PM, please contact the day physician for the patient [email or web address] Password [facility]  06/22/2016, 9:04 PM

## 2016-06-22 NOTE — ED Provider Notes (Signed)
[facility] DEPT Provider Note   CSN: [id number] Arrival date & time: 06/22/16  1435     History   Chief Complaint Chief Complaint  Patient presents with  . Asthma    HPI April [name] is a 46 y.o. female.  HPI Patient presents with shortness of breath and cough. Has had symptoms for the week. Seen yesterday in the ER and was supposed be admitted. She however refused admission. She had seen by the admitting team. Reportedly left to go home. States she's not getting steroids. States she has not slept well all night. Continues to cough and shortness of breath. History of asthma. Really does not want to be admitted to either. States can't I just get prednisone and go home. No fevers.   Past Medical History:  Diagnosis Date  . Acanthosis nigricans   . Arthritis   . Asthma    Followed by Dr. Annamaria Boots (pulmonology); receives every other week omalizumab injections; has frequent exacerbations  . COPD (chronic obstructive pulmonary disease) ([place])    PFTs in 2002, FEV1/FVC 65, no post bronchodilater test done  . Depression   . GERD (gastroesophageal reflux disease)   . Headache(784.0)   . Helicobacter pylori (H. pylori) infection   . Hypertension, essential   . Insomnia   . Menorrhagia   . Morbid obesity ([place])   . Obesity   . Seasonal allergies   . Shortness of breath   . Sleep apnea    Sleep study 2008 - mild OSA, not enough events to titrate CPAP  . Tobacco user     Patient Active Problem List   Diagnosis Date Noted  . Malignant hypertension due to primary aldosteronism ([place]) 05/05/2016  . Lip laceration 05/05/2016  . Elevated hemoglobin A1c 05/05/2016  . Asthma exacerbation 11/10/2015  . GERD (gastroesophageal reflux disease) 08/30/2015  . Generalized anxiety disorder 08/30/2015  . Morbid obesity ([place])   . Seasonal allergic rhinitis 08/29/2013  . Tobacco abuse 10/07/2012  . Asthma, chronic obstructive, without status asthmaticus ([place]) 05/07/2012  . Knee pain,  bilateral 04/25/2011  . Primary insomnia 03/14/2011  . Obstructive sleep apnea 12/19/2010  . Hypokalemia 08/13/2010  . Cervical back pain with evidence of disc disease 04/08/2008  . Essential hypertension 07/31/2006  . Morbid obesity with body mass index of 50.0-59.9 in adult ([place]) 06/17/2006  . Major depressive disorder, recurrent episode ([place]) 04/10/2006    Past Surgical History:  Procedure Laterality Date  . BREAST REDUCTION SURGERY  09/2011  . TUBAL LIGATION  1996   bilateral    OB History    No data available       Home Medications    Prior to Admission medications   Medication Sig Start Date End Date Taking? Authorizing Provider  acetaminophen (TYLENOL) 500 MG tablet Take 2 tablets (1,000 mg total) by mouth every 6 (six) hours as needed. 08/30/15   [name], MD  albuterol [facility] HFA) [phone] Base) MCG/ACT inhaler Inhale 1-2 puffs into the lungs every 6 (six) hours as needed for wheezing or shortness of breath. 06/18/16   Harrie Foreman, MD  albuterol (PROVENTIL) (2.5 MG/3ML) 0.083% nebulizer solution Take 3 mLs (2.5 mg total) by nebulization every 4 (four) hours as needed for wheezing or shortness of breath. 06/06/16   Deneise Lever, MD  amLODipine (NORVASC) 10 MG tablet Take 1 tablet (10 mg total) by mouth daily. 05/03/16   Josalyn Funches, MD  benzonatate (TESSALON) 100 MG capsule Take 1 capsule (100 mg total) by  mouth 3 (three) times daily as needed for cough. 05/07/16   Geradine Girt, DO  cetirizine-pseudoephedrine (ZYRTEC-D) 5-120 MG tablet Take 1 tablet by mouth daily. 06/18/16   Harrie Foreman, MD  esomeprazole (NEXIUM) 40 MG capsule Take 1 capsule (40 mg total) by mouth daily. 06/10/16   Deneise Lever, MD  fluticasone (FLONASE) 50 MCG/ACT nasal spray Place 2 sprays into both nostrils daily. 12/27/15   Elsie Stain, MD  guaiFENesin (MUCINEX) 600 MG 12 hr tablet Take 2 tablets (1,200 mg total) by mouth 2 (two) times daily. Patient taking differently: Take 1,200  mg by mouth 2 (two) times daily as needed for cough or to loosen phlegm.  05/07/16   Geradine Girt, DO  hydrochlorothiazide (HYDRODIURIL) 25 MG tablet Take 25 mg by mouth daily. 03/25/16   Historical Provider, MD  ibuprofen (ADVIL,MOTRIN) 200 MG tablet Take 600 mg by mouth 2 (two) times daily as needed for moderate pain (knee pain).     Historical Provider, MD  ipratropium (ATROVENT) 0.02 % nebulizer solution Take 2.5 mLs (0.5 mg total) by nebulization 4 (four) times daily as needed for wheezing or shortness of breath. 06/06/16   Deneise Lever, MD  lisinopril (PRINIVIL,ZESTRIL) 10 MG tablet Take 10 mg by mouth daily. 03/27/16   Historical Provider, MD  loratadine (CLARITIN) 10 MG tablet Take 1 tablet (10 mg total) by mouth daily. 08/30/15   [name], MD  metFORMIN (GLUCOPHAGE) 500 MG tablet Take 1 tablet (500 mg total) by mouth 2 (two) times daily with a meal. 05/07/16   Geradine Girt, DO  mometasone-formoterol (DULERA) 200-5 MCG/ACT AERO Inhale 2 puffs into the lungs 2 (two) times daily. 06/06/16   Deneise Lever, MD  nicotine (NICODERM CQ - DOSED IN MG/24 HOURS) 14 mg/24hr patch Place 14 mg onto the skin daily as needed (SMOKING). Wednesdays and Sundays    Historical Provider, MD  predniSONE (DELTASONE) 10 MG tablet 40mg X2 days, 30mg  X2 days, 20mg  X2 days, 10mg X2 days, then 10mg  qod. Patient not taking: Reported on 06/21/2016 06/18/16   Harrie Foreman, MD  sertraline (ZOLOFT) 50 MG tablet Take 1 tablet (50 mg total) by mouth daily. 08/30/15   [name], MD  spironolactone (ALDACTONE) 50 MG tablet Take 2 tablets (100 mg total) by mouth daily. 05/21/16   Josalyn Funches, MD  Tiotropium Bromide Monohydrate (SPIRIVA RESPIMAT) 2.5 MCG/ACT AERS Inhale 2 puffs into the lungs daily. 05/07/16   Geradine Girt, DO  traZODone (DESYREL) 50 MG tablet Take 0.5-1 tablets (25-50 mg total) by mouth at bedtime as needed for sleep. 05/21/16   Boykin Nearing, MD    Family History Family History  Problem Relation Age of  Onset  . Hypertension Mother   . Asthma Daughter   . Cancer Paternal Aunt   . Asthma Maternal Grandmother     Social History Social History  Substance Use Topics  . Smoking status: Former Smoker    Years: 18.00    Types: Cigarettes  . Smokeless tobacco: Never Used  . Alcohol use No     Allergies   Patient has no known allergies.   Review of Systems Review of Systems  Constitutional: Negative for appetite change and fever.  HENT: Positive for congestion.   Respiratory: Positive for cough, shortness of breath and wheezing.   Cardiovascular: Negative for chest pain.  Gastrointestinal: Negative for abdominal pain.  Endocrine: Negative for polyuria.  Genitourinary: Negative for dysuria.  Musculoskeletal: Negative for back pain.  Skin: Negative for wound.  Neurological: Negative for weakness and numbness.  Psychiatric/Behavioral: Negative for confusion.     Physical Exam Updated Vital Signs BP (!) 201/112   Pulse 120   Temp 99 F (37.2 C) (Oral)   Resp (!) 29   Ht 5\' 6"  (1.676 m)   Wt (!) 340 lb (154.2 kg)   LMP 04/23/2016   SpO2 95%   BMI 54.88 kg/m   Physical Exam  Constitutional: She appears well-developed.  HENT:  Head: Normocephalic.  Eyes: EOM are normal.  Neck: Neck supple.  Cardiovascular:  Mild tachycardia  Pulmonary/Chest: Effort normal.  Diffuse harsh wheezes and prolonged expirations.  Abdominal: Soft.  Musculoskeletal: She exhibits no edema.  Neurological: She is alert.  Skin: Capillary refill takes less than 2 seconds.  Psychiatric: She has a normal mood and affect.     ED Treatments / Results  Labs (all labs ordered are listed, but only abnormal results are displayed) Labs Reviewed  CBG MONITORING, ED    EKG  EKG Interpretation None       Radiology Dg Chest Portable 1 View  Result Date: 06/21/2016 CLINICAL DATA:  Shortness of breath . EXAM: PORTABLE CHEST 1 VIEW COMPARISON:  05/09/2016. FINDINGS: Cardiomegaly with mild  bilateral interstitial prominence suggesting mild CHF. Small bilateral pleural effusions cannot be excluded. No pneumothorax . IMPRESSION: Cardiomegaly with very mild interstitial prominence suggesting mild interstitial edema. Small bilateral pleural effusions cannot be excluded . Electronically Signed   By: Marcello Moores  Register   On: 06/21/2016 12:33    Procedures Procedures (including critical care time)  Medications Ordered in ED Medications  magnesium sulfate IVPB 2 g 50 mL (not administered)  amLODipine (NORVASC) tablet 10 mg (not administered)  albuterol (PROVENTIL,VENTOLIN) solution continuous neb (10 mg/hr Nebulization Given 06/22/16 1633)  ipratropium (ATROVENT) nebulizer solution 0.5 mg (0.5 mg Nebulization Given 06/22/16 1633)  predniSONE (DELTASONE) tablet 60 mg (60 mg Oral Given 06/22/16 1634)     Initial Impression / Assessment and Plan / ED Course  I have reviewed the triage vital signs and the nursing notes.  Pertinent labs & imaging results that were available during my care of the patient were reviewed by me and considered in my medical decision making (see chart for details).     Patient was shortness of breath and asthma. History of same. Did not get steroids at home. Does get hypoxic with sleeping and on room air. Sats went down to upper 70s while she was sleeping. Given hour-long nebulizer here. Lab work reviewed from yesterday. X-ray done yesterday. Will admit to internal medicine.  Final Clinical Impressions(s) / ED Diagnoses   Final diagnoses:  Severe persistent asthma with exacerbation    New Prescriptions New Prescriptions   No medications on file     Davonna Belling, MD 06/22/16 [phone]

## 2016-06-23 DIAGNOSIS — Z825 Family history of asthma and other chronic lower respiratory diseases: Secondary | ICD-10-CM | POA: Diagnosis not present

## 2016-06-23 DIAGNOSIS — E876 Hypokalemia: Secondary | ICD-10-CM | POA: Diagnosis present

## 2016-06-23 DIAGNOSIS — Z7984 Long term (current) use of oral hypoglycemic drugs: Secondary | ICD-10-CM | POA: Diagnosis not present

## 2016-06-23 DIAGNOSIS — G4733 Obstructive sleep apnea (adult) (pediatric): Secondary | ICD-10-CM | POA: Diagnosis present

## 2016-06-23 DIAGNOSIS — E2609 Other primary hyperaldosteronism: Secondary | ICD-10-CM | POA: Diagnosis present

## 2016-06-23 DIAGNOSIS — Z7951 Long term (current) use of inhaled steroids: Secondary | ICD-10-CM | POA: Diagnosis not present

## 2016-06-23 DIAGNOSIS — I160 Hypertensive urgency: Secondary | ICD-10-CM | POA: Diagnosis present

## 2016-06-23 DIAGNOSIS — Z8249 Family history of ischemic heart disease and other diseases of the circulatory system: Secondary | ICD-10-CM | POA: Diagnosis not present

## 2016-06-23 DIAGNOSIS — F17200 Nicotine dependence, unspecified, uncomplicated: Secondary | ICD-10-CM

## 2016-06-23 DIAGNOSIS — I152 Hypertension secondary to endocrine disorders: Secondary | ICD-10-CM | POA: Diagnosis not present

## 2016-06-23 DIAGNOSIS — F329 Major depressive disorder, single episode, unspecified: Secondary | ICD-10-CM | POA: Diagnosis present

## 2016-06-23 DIAGNOSIS — J302 Other seasonal allergic rhinitis: Secondary | ICD-10-CM | POA: Diagnosis not present

## 2016-06-23 DIAGNOSIS — I119 Hypertensive heart disease without heart failure: Secondary | ICD-10-CM | POA: Diagnosis present

## 2016-06-23 DIAGNOSIS — K219 Gastro-esophageal reflux disease without esophagitis: Secondary | ICD-10-CM | POA: Diagnosis present

## 2016-06-23 DIAGNOSIS — J189 Pneumonia, unspecified organism: Secondary | ICD-10-CM | POA: Diagnosis present

## 2016-06-23 DIAGNOSIS — Z6841 Body Mass Index (BMI) 40.0 and over, adult: Secondary | ICD-10-CM | POA: Diagnosis not present

## 2016-06-23 DIAGNOSIS — I517 Cardiomegaly: Secondary | ICD-10-CM | POA: Diagnosis present

## 2016-06-23 DIAGNOSIS — J4551 Severe persistent asthma with (acute) exacerbation: Principal | ICD-10-CM

## 2016-06-23 DIAGNOSIS — I509 Heart failure, unspecified: Secondary | ICD-10-CM | POA: Diagnosis not present

## 2016-06-23 DIAGNOSIS — J811 Chronic pulmonary edema: Secondary | ICD-10-CM | POA: Diagnosis present

## 2016-06-23 DIAGNOSIS — Z791 Long term (current) use of non-steroidal anti-inflammatories (NSAID): Secondary | ICD-10-CM | POA: Diagnosis not present

## 2016-06-23 DIAGNOSIS — I10 Essential (primary) hypertension: Secondary | ICD-10-CM | POA: Diagnosis not present

## 2016-06-23 DIAGNOSIS — Z79899 Other long term (current) drug therapy: Secondary | ICD-10-CM | POA: Diagnosis not present

## 2016-06-23 DIAGNOSIS — J440 Chronic obstructive pulmonary disease with acute lower respiratory infection: Secondary | ICD-10-CM | POA: Diagnosis present

## 2016-06-23 LAB — CBC
HCT: 36.8 % (ref 36.0–46.0)
Hemoglobin: 11.4 g/dL — ABNORMAL LOW (ref 12.0–15.0)
MCH: 25.9 pg — AB (ref 26.0–34.0)
MCHC: 31 g/dL (ref 30.0–36.0)
MCV: 83.6 fL (ref 78.0–100.0)
PLATELETS: 318 10*3/uL (ref 150–400)
RBC: 4.4 MIL/uL (ref 3.87–5.11)
RDW: 20.8 % — ABNORMAL HIGH (ref 11.5–15.5)
WBC: 21.4 10*3/uL — ABNORMAL HIGH (ref 4.0–10.5)

## 2016-06-23 LAB — BASIC METABOLIC PANEL
Anion gap: 10 (ref 5–15)
BUN: 8 mg/dL (ref 6–20)
CO2: 29 mmol/L (ref 22–32)
CREATININE: 0.81 mg/dL (ref 0.44–1.00)
Calcium: 8.5 mg/dL — ABNORMAL LOW (ref 8.9–10.3)
Chloride: 101 mmol/L (ref 101–111)
GFR calc Af Amer: >60 mL/min (ref >60)
Glucose, Bld: 167 mg/dL — ABNORMAL HIGH (ref 65–99)
POTASSIUM: 3.3 mmol/L — AB (ref 3.5–5.1)
SODIUM: 140 mmol/L (ref 135–145)

## 2016-06-23 LAB — HIV ANTIBODY (ROUTINE TESTING W REFLEX): HIV Screen 4th Generation wRfx: NONREACTIVE

## 2016-06-23 MED ORDER — METHYLPREDNISOLONE SODIUM SUCC 125 MG IJ SOLR
60.0000 mg | Freq: Three times a day (TID) | INTRAMUSCULAR | Status: DC
  Administered 2016-06-23 – 2016-06-24 (×4): 60 mg via INTRAVENOUS
  Filled 2016-06-23 (×4): qty 2

## 2016-06-23 MED ORDER — HYDRALAZINE HCL 20 MG/ML IJ SOLN
10.0000 mg | INTRAMUSCULAR | Status: DC | PRN
  Administered 2016-06-23 – 2016-06-24 (×2): 10 mg via INTRAVENOUS
  Filled 2016-06-23 (×2): qty 1

## 2016-06-23 MED ORDER — LISINOPRIL 10 MG PO TABS: 20.0000 mg | ORAL_TABLET | Freq: Every day | ORAL | Status: DC

## 2016-06-23 MED ORDER — OXYMETAZOLINE HCL 0.05 % NA SOLN
1.0000 | Freq: Two times a day (BID) | NASAL | Status: DC
  Administered 2016-06-23 – 2016-06-25 (×4): 1 via NASAL
  Filled 2016-06-23: qty 15

## 2016-06-23 MED ORDER — ALBUTEROL SULFATE (2.5 MG/3ML) 0.083% IN NEBU
2.5000 mg | INHALATION_SOLUTION | Freq: Four times a day (QID) | RESPIRATORY_TRACT | Status: DC
  Administered 2016-06-23: 2.5 mg via RESPIRATORY_TRACT
  Filled 2016-06-23: qty 3

## 2016-06-23 MED ORDER — DEXTROSE 5 % IV SOLN
500.0000 mg | INTRAVENOUS | Status: DC
  Administered 2016-06-23 – 2016-06-25 (×3): 500 mg via INTRAVENOUS
  Filled 2016-06-23 (×3): qty 500

## 2016-06-23 MED ORDER — IPRATROPIUM-ALBUTEROL 0.5-2.5 (3) MG/3ML IN SOLN
3.0000 mL | RESPIRATORY_TRACT | Status: DC
  Administered 2016-06-23 – 2016-06-24 (×6): 3 mL via RESPIRATORY_TRACT
  Filled 2016-06-23 (×6): qty 3

## 2016-06-23 MED ORDER — IPRATROPIUM-ALBUTEROL 0.5-2.5 (3) MG/3ML IN SOLN
3.0000 mL | Freq: Four times a day (QID) | RESPIRATORY_TRACT | Status: DC
  Administered 2016-06-23: 3 mL via RESPIRATORY_TRACT
  Filled 2016-06-23: qty 3

## 2016-06-23 MED ORDER — POTASSIUM CHLORIDE CRYS ER 20 MEQ PO TBCR
40.0000 meq | EXTENDED_RELEASE_TABLET | Freq: Once | ORAL | Status: AC
  Administered 2016-06-23: 40 meq via ORAL
  Filled 2016-06-23: qty 2

## 2016-06-23 MED ORDER — CEFTRIAXONE SODIUM 2 G IJ SOLR
2.0000 g | INTRAMUSCULAR | Status: DC
  Administered 2016-06-23 – 2016-06-25 (×3): 2 g via INTRAVENOUS
  Filled 2016-06-23 (×3): qty 2

## 2016-06-23 MED ORDER — BUDESONIDE 0.25 MG/2ML IN SUSP
0.2500 mg | Freq: Two times a day (BID) | RESPIRATORY_TRACT | Status: DC
  Administered 2016-06-23 – 2016-06-25 (×5): 0.25 mg via RESPIRATORY_TRACT
  Filled 2016-06-23 (×5): qty 2

## 2016-06-23 NOTE — Progress Notes (Signed)
Patient was provided with CPAP machine and counseling about sleep apnea.  Patient was unable to tolerate nasal CPAP for more than 30 seconds, even with coaching.  Per patient, she is scheduled for a sleep study on March 1.  Patient was encouraged to have the study and comply with the recommendations.  Importance of CPAP therapy discussed with patient.

## 2016-06-23 NOTE — Progress Notes (Signed)
PROGRESS NOTE        PATIENT DETAILS Name: April [name] Age: 46 y.o. Sex: female Date of Birth: 11/10/1970 Admit Date: 06/22/2016 Admitting Physician [name], DO [id number], Lennox Laity, MD  Brief Narrative: Patient is a 46 y.o. female with history of severe persistent asthma on Xolair infusion as outpatient, morbid obesity, hypertension presented with worsening shortness of breath, found to have asthma exacerbation and admitted for further evaluation and treatment.  Subjective: Continues to be short of breath-although slightly better than on admission, she is not back to her usual baseline.  Assessment/Plan: Severe persistent Asthma with exacerbation: Although improved, not yet back to her baseline. She still has coarse wheezing but is moving air bilaterally and appears comfortable. Continue IV Solu-Medrol, scheduled DuoNeb's, add budesonide nebs. She does claim to have some sinus congestion, continue Flonase-we will add Afrin. Continue PPI and Claritin. X-ray shows some interstitial pattern-could have atypical pneumonia-will begin empiric Rocephin and Zithromax. Will monitor closely, if no improvement with above measures, will consult please see him.  ? Community acquired pneumonia: Chest x-ray shows interstitial pattern-no clinical features suggestive of CHF. Will empirically start Rocephin and Zithromax-plan a 5 day course of empiric antibiotics. Await echo-but BNP within normal limits (however patient obese)  Hypertension: Better controlled this morning-continue Aldactone, amlodipine, lisinopril. However we will stop HCTZ due to history of chronic hypokalemia and increase lisinopril to 20 mg.  Hypokalemia: Appears to be a chronic issue-not sure if she has underlying primary hyperaldosteronism. Would defer further workup to her PCP at this time.  Probable obstructive sleep apnea: Polysomnography scheduled for next month per patient. She was unable to  tolerate CPAP while inpatient.  Morbid obesity: Encouraged weight loss  GERD: Continue Protonix  MDD: Stable, continue Zoloft  Tobacco abuse: Counseled, continue transdermal nicotine  DVT Prophylaxis: Prophylactic Lovenox   Code Status: Full code   Family Communication: None at bedside  Disposition Plan: Remain inpatient-requires several more days of hospitalization prior to discharge.  Antimicrobial agents: Anti-infectives    Start     Dose/Rate Route Frequency Ordered Stop   06/23/16 1000  azithromycin (ZITHROMAX) 500 mg in dextrose 5 % 250 mL IVPB     500 mg 250 mL/hr over 60 Minutes Intravenous Every 24 hours 06/23/16 0825     06/23/16 1000  cefTRIAXone (ROCEPHIN) 2 g in dextrose 5 % 50 mL IVPB     2 g 100 mL/hr over 30 Minutes Intravenous Every 24 hours 06/23/16 0825        Procedures: None  CONSULTS:  None  Time spent: 25 minutes-Greater than 50% of this time was spent in counseling, explanation of diagnosis, planning of further management, and coordination of care.  MEDICATIONS: Scheduled Meds: . [START ON 06/24/2016] amLODipine  10 mg Oral Daily  . azithromycin  500 mg Intravenous Q24H  . budesonide (PULMICORT) nebulizer solution  0.25 mg Nebulization BID  . cefTRIAXone (ROCEPHIN)  IV  2 g Intravenous Q24H  . enoxaparin (LOVENOX) injection  75 mg Subcutaneous Q24H  . fluticasone  2 spray Each Nare Daily  . hydrochlorothiazide  25 mg Oral Daily  . ipratropium-albuterol  3 mL Nebulization Q6H  . lisinopril  10 mg Oral Daily  . loratadine  10 mg Oral Daily  . methylPREDNISolone (SOLU-MEDROL) injection  60 mg Intravenous Q8H  . nicotine  14 mg Transdermal Daily  .  oxymetazoline  1 spray Each Nare BID  . pantoprazole  80 mg Oral Q1200  . sertraline  50 mg Oral Daily  . sodium chloride flush  3 mL Intravenous Q12H  . spironolactone  100 mg Oral Daily   Continuous Infusions: PRN Meds:.acetaminophen, albuterol, benzonatate, guaiFENesin, hydrALAZINE,  ipratropium, traZODone   PHYSICAL EXAM: Vital signs: Vitals:   06/22/16 2145 06/22/16 2208 06/22/16 2325 06/23/16 0349  BP: 193/78 160/88 (!) 185/78 (!) 162/84  Pulse: (!) 126  (!) 124 (!) 107  Resp: (!) 33  (!) 24 (!) 24  Temp:   97.9 F (36.6 C) 98.7 F (37.1 C)  TempSrc:   Oral Oral  SpO2: 97%  98% 96%  Weight:   (!) 157.9 kg (348 lb)   Height:   5\' 6"  (1.676 m)    Filed Weights   06/22/16 1445 06/22/16 2325  Weight: (!) 154.2 kg (340 lb) (!) 157.9 kg (348 lb)   Body mass index is 56.17 kg/m.   General appearance :Awake, alert, not in any distress. Speech Clear. Not toxic Looking Eyes:, pupils equally reactive to light and accomodation,no scleral icterus.Pink conjunctiva HEENT: Atraumatic and Normocephalic Neck: supple, no JVD. No cervical lymphadenopathy. No thyromegaly Resp:Moving air bilaterally-but coarse rhonchi all over CVS: S1 S2 regular, no murmurs.  GI: Bowel sounds present, Non tender and not distended with no gaurding, rigidity or rebound Extremities: B/L Lower Ext shows no edema, both legs are warm to touch Neurology:  speech clear,Non focal, sensation is grossly intact. Psychiatric: Normal judgment and insight. Alert and oriented x 3.  Musculoskeletal:No digital cyanosis Skin:No Rash, warm and dry Wounds:N/A  I have personally reviewed following labs and imaging studies  LABORATORY DATA: CBC:  Recent Labs Lab 06/21/16 1136 06/23/16 0346  WBC 22.9* 21.4*  HGB 11.6* 11.4*  HCT 37.7 36.8  MCV 82.9 83.6  PLT 370 0000000    Basic Metabolic Panel:  Recent Labs Lab 06/21/16 1143 06/23/16 0346  NA 139 140  K 3.3* 3.3*  CL 104 101  CO2 26 29  GLUCOSE 143* 167*  BUN 9 8  CREATININE 0.78 0.81  CALCIUM 8.9 8.5*    GFR: Estimated Creatinine Clearance: 139.5 mL/min (by C-G formula based on SCr of 0.81 mg/dL).  Liver Function Tests:  Recent Labs Lab 06/21/16 1143  AST 19  ALT 20  ALKPHOS 60  BILITOT 0.5  PROT 6.4*  ALBUMIN 3.3*   No  results for input(s): LIPASE, AMYLASE in the last 168 hours. No results for input(s): AMMONIA in the last 168 hours.  Coagulation Profile: No results for input(s): INR, PROTIME in the last 168 hours.  Cardiac Enzymes: No results for input(s): CKTOTAL, CKMB, CKMBINDEX, TROPONINI in the last 168 hours.  BNP (last 3 results) No results for input(s): PROBNP in the last 8760 hours.  HbA1C: No results for input(s): HGBA1C in the last 72 hours.  CBG:  Recent Labs Lab 06/22/16 2247  GLUCAP 314*    Lipid Profile: No results for input(s): CHOL, HDL, LDLCALC, TRIG, CHOLHDL, LDLDIRECT in the last 72 hours.  Thyroid Function Tests: No results for input(s): TSH, T4TOTAL, FREET4, T3FREE, THYROIDAB in the last 72 hours.  Anemia Panel: No results for input(s): VITAMINB12, FOLATE, FERRITIN, TIBC, IRON, RETICCTPCT in the last 72 hours.  Urine analysis:    Component Value Date/Time   COLORURINE YELLOW 11/21/2014 0707   APPEARANCEUR CLOUDY (A) 11/21/2014 0707   LABSPEC 1.024 11/21/2014 0707   PHURINE 6.0 11/21/2014 0707   GLUCOSEU NEGATIVE  11/21/2014 0707   GLUCOSEU NEG mg/dL 10/28/2007 2049   HGBUR TRACE (A) 11/21/2014 0707   BILIRUBINUR NEGATIVE 11/21/2014 0707   KETONESUR NEGATIVE 11/21/2014 0707   PROTEINUR NEGATIVE 11/21/2014 0707   UROBILINOGEN 1.0 11/21/2014 0707   NITRITE NEGATIVE 11/21/2014 0707   LEUKOCYTESUR NEGATIVE 11/21/2014 0707    Sepsis Labs: Lactic Acid, Venous No results found for: LATICACIDVEN  MICROBIOLOGY: No results found for this or any previous visit (from the past 240 hour(s)).  RADIOLOGY STUDIES/RESULTS: Dg Chest Portable 1 View  Result Date: 06/21/2016 CLINICAL DATA:  Shortness of breath . EXAM: PORTABLE CHEST 1 VIEW COMPARISON:  05/09/2016. FINDINGS: Cardiomegaly with mild bilateral interstitial prominence suggesting mild CHF. Small bilateral pleural effusions cannot be excluded. No pneumothorax . IMPRESSION: Cardiomegaly with very mild interstitial  prominence suggesting mild interstitial edema. Small bilateral pleural effusions cannot be excluded . Electronically Signed   By: Marcello Moores  Register   On: 06/21/2016 12:33     LOS: 0 days   Oren Binet, MD  [facility] Pager:[phone]  If 7PM-7AM, please contact night-coverage [email or web address] Password [facility] 06/23/2016, 10:21 AM

## 2016-06-24 ENCOUNTER — Inpatient Hospital Stay (HOSPITAL_COMMUNITY): Payer: Medicaid Other

## 2016-06-24 DIAGNOSIS — I509 Heart failure, unspecified: Secondary | ICD-10-CM

## 2016-06-24 DIAGNOSIS — I160 Hypertensive urgency: Secondary | ICD-10-CM

## 2016-06-24 LAB — ECHOCARDIOGRAM COMPLETE
CHL CUP DOP CALC LVOT VTI: 23.5 cm
E decel time: 214 msec
E/e' ratio: 11.59
FS: 31 % (ref 28–44)
HEIGHTINCHES: 66 in
IVS/LV PW RATIO, ED: 1.07
LA ID, A-P, ES: 34 mm
LA vol A4C: 58.2 ml
LA vol index: 20.6 mL/m2
LADIAMINDEX: 1.21 cm/m2
LAVOL: 57.8 mL
LDCA: 2.84 cm2
LEFT ATRIUM END SYS DIAM: 34 mm
LV E/e'average: 11.59
LV PW d: 11.8 mm — AB (ref 0.6–1.1)
LV TDI E'MEDIAL: 8.75
LVEEMED: 11.59
LVELAT: 9.32 cm/s
LVOT diameter: 19 mm
LVOT peak grad rest: 6 mmHg
LVOT peak vel: 126 cm/s
LVOTSV: 67 mL
MV Dec: 214
MVPG: 5 mmHg
MVPKAVEL: 106 m/s
MVPKEVEL: 108 m/s
RV LATERAL S' VELOCITY: 15.9 cm/s
RV TAPSE: 22.3 mm
TDI e' lateral: 9.32
WEIGHTICAEL: 5568 [oz_av]

## 2016-06-24 LAB — BASIC METABOLIC PANEL
Anion gap: 9 (ref 5–15)
BUN: 9 mg/dL (ref 6–20)
CALCIUM: 8.9 mg/dL (ref 8.9–10.3)
CO2: 30 mmol/L (ref 22–32)
CREATININE: 0.76 mg/dL (ref 0.44–1.00)
Chloride: 99 mmol/L — ABNORMAL LOW (ref 101–111)
GFR calc Af Amer: >60 mL/min (ref >60)
GLUCOSE: 212 mg/dL — AB (ref 65–99)
Potassium: 4 mmol/L (ref 3.5–5.1)
Sodium: 138 mmol/L (ref 135–145)

## 2016-06-24 MED ORDER — HYDRALAZINE HCL 20 MG/ML IJ SOLN
10.0000 mg | Freq: Once | INTRAMUSCULAR | Status: AC
  Administered 2016-06-24: 10 mg via INTRAVENOUS
  Filled 2016-06-24: qty 1

## 2016-06-24 MED ORDER — HYDRALAZINE HCL 25 MG PO TABS
25.0000 mg | ORAL_TABLET | Freq: Two times a day (BID) | ORAL | Status: DC
  Administered 2016-06-24 (×2): 25 mg via ORAL
  Filled 2016-06-24 (×2): qty 1

## 2016-06-24 MED ORDER — METHYLPREDNISOLONE SODIUM SUCC 40 MG IJ SOLR
40.0000 mg | Freq: Three times a day (TID) | INTRAMUSCULAR | Status: DC
  Administered 2016-06-24 – 2016-06-25 (×3): 40 mg via INTRAVENOUS
  Filled 2016-06-24 (×3): qty 1

## 2016-06-24 MED ORDER — LISINOPRIL 40 MG PO TABS
40.0000 mg | ORAL_TABLET | Freq: Every day | ORAL | Status: DC
  Administered 2016-06-24 – 2016-06-25 (×2): 40 mg via ORAL
  Filled 2016-06-24: qty 1
  Filled 2016-06-24: qty 4

## 2016-06-24 MED ORDER — IPRATROPIUM-ALBUTEROL 0.5-2.5 (3) MG/3ML IN SOLN
3.0000 mL | Freq: Three times a day (TID) | RESPIRATORY_TRACT | Status: DC
  Administered 2016-06-25 (×2): 3 mL via RESPIRATORY_TRACT
  Filled 2016-06-24 (×2): qty 3

## 2016-06-24 NOTE — Progress Notes (Signed)
  Echocardiogram 2D Echocardiogram has been performed.  April [name] 06/24/2016, 11:15 AM

## 2016-06-24 NOTE — Progress Notes (Signed)
RT NOTE:  Pt will manage CPAP tonight. She understands to call RT if assistance is needed.

## 2016-06-24 NOTE — Progress Notes (Signed)
PROGRESS NOTE        PATIENT DETAILS Name: April [name] Age: 46 y.o. Sex: female Date of Birth: Jan 07, 1971 Admit Date: 06/22/2016 Admitting Physician [name], DO [id number], Lennox Laity, MD  Brief Narrative: Patient is a 46 y.o. female with history of severe persistent asthma on Xolair infusion as outpatient, morbid obesity, hypertension presented with worsening shortness of breath, found to have asthma exacerbation and admitted for further evaluation and treatment.  Subjective: Improved-bringing up more phlegm this morning. Significantly less congestion in the nose/sinuses. She still is not back to her usual baseline.  Assessment/Plan: Severe persistent Asthma with exacerbation: Although improved, not yet back to her baseline. Continues to have coarse wheezing but is moving air bilaterally and appears comfortable. Continue IV Solu-Medrol, scheduled DuoNeb's, add budesonide nebs. Continue with Flonase, Afrin, Claritin. Continue PPI. We will continue to monitor closely, if no improvement in spite of supportive measures, will consult pulmonary critical care.  ? Community acquired pneumonia: Chest x-ray shows interstitial pattern-no clinical features suggestive of CHF. Will empirically start Rocephin and Zithromax-plan a 5 day course of empiric antibiotics. Await echo-but BNP within normal limits (however patient obese)  Hypertension: Fluctuating-but still not optimally controlled-continue Aldactone, amlodipine, lisinopril. We will increase lisinopril to 40 mg, and add hydralazine.   Hypokalemia: Appears to be a chronic issue-not sure if she has underlying primary hyperaldosteronism-she is already on Aldactone-and getting a plasma renin/aldosterone activity ratio would probably be inaccurate. Would defer further workup to her PCP at this time.  Probable obstructive sleep apnea: Polysomnography scheduled for next month per patient. She was unable to tolerate CPAP  while inpatient.  Morbid obesity: Encouraged weight loss  GERD: Continue Protonix  MDD: Stable, continue Zoloft  Tobacco abuse: Counseled, continue transdermal nicotine  DVT Prophylaxis: Prophylactic Lovenox   Code Status: Full code   Family Communication: None at bedside  Disposition Plan: Remain inpatient-requires several more days of hospitalization prior to discharge.  Antimicrobial agents: Anti-infectives    Start     Dose/Rate Route Frequency Ordered Stop   06/23/16 1000  azithromycin (ZITHROMAX) 500 mg in dextrose 5 % 250 mL IVPB     500 mg 250 mL/hr over 60 Minutes Intravenous Every 24 hours 06/23/16 0825     06/23/16 1000  cefTRIAXone (ROCEPHIN) 2 g in dextrose 5 % 50 mL IVPB     2 g 100 mL/hr over 30 Minutes Intravenous Every 24 hours 06/23/16 0825        Procedures: None  CONSULTS:  None  Time spent: 25 minutes-Greater than 50% of this time was spent in counseling, explanation of diagnosis, planning of further management, and coordination of care.  MEDICATIONS: Scheduled Meds: . amLODipine  10 mg Oral Daily  . azithromycin  500 mg Intravenous Q24H  . budesonide (PULMICORT) nebulizer solution  0.25 mg Nebulization BID  . cefTRIAXone (ROCEPHIN)  IV  2 g Intravenous Q24H  . enoxaparin (LOVENOX) injection  75 mg Subcutaneous Q24H  . fluticasone  2 spray Each Nare Daily  . hydrALAZINE  25 mg Oral BID  . ipratropium-albuterol  3 mL Nebulization Q4H  . lisinopril  40 mg Oral Daily  . loratadine  10 mg Oral Daily  . methylPREDNISolone (SOLU-MEDROL) injection  60 mg Intravenous Q8H  . nicotine  14 mg Transdermal Daily  . oxymetazoline  1 spray Each Nare BID  .  pantoprazole  80 mg Oral Q1200  . sertraline  50 mg Oral Daily  . sodium chloride flush  3 mL Intravenous Q12H  . spironolactone  100 mg Oral Daily   Continuous Infusions: PRN Meds:.acetaminophen, albuterol, benzonatate, guaiFENesin, hydrALAZINE, traZODone   PHYSICAL EXAM: Vital  signs: Vitals:   06/24/16 0355 06/24/16 0534 06/24/16 0704 06/24/16 0753  BP: (!) 179/113 (!) 189/118 (!) 161/81   Pulse:  (!) 110    Resp:  20    Temp:  98.1 F (36.7 C)    TempSrc:  Oral    SpO2:  100% 99% 96%  Weight:      Height:       Filed Weights   06/22/16 1445 06/22/16 2325  Weight: (!) 154.2 kg (340 lb) (!) 157.9 kg (348 lb)   Body mass index is 56.17 kg/m.   General appearance :Awake, alert, not in any distress. Speech Clear. Not toxic Looking Eyes:, pupils equally reactive to light and accomodation,no scleral icterus.Pink conjunctiva HEENT: Atraumatic and Normocephalic Neck: supple, no JVD. No cervical lymphadenopathy. No thyromegaly Resp:Moving air bilaterally-but Continues to have coarse rhonchi all over CVS: S1 S2 regular, no murmurs.  GI: Bowel sounds present, Non tender and not distended with no gaurding, rigidity or rebound Extremities: B/L Lower Ext shows no edema, both legs are warm to touch Neurology:  speech clear,Non focal, sensation is grossly intact. Psychiatric: Normal judgment and insight. Alert and oriented x 3.  Musculoskeletal:No digital cyanosis Skin:No Rash, warm and dry Wounds:N/A  I have personally reviewed following labs and imaging studies  LABORATORY DATA: CBC:  Recent Labs Lab 06/21/16 1136 06/23/16 0346  WBC 22.9* 21.4*  HGB 11.6* 11.4*  HCT 37.7 36.8  MCV 82.9 83.6  PLT 370 0000000    Basic Metabolic Panel:  Recent Labs Lab 06/21/16 1143 06/23/16 0346 06/24/16 0234  NA 139 140 138  K 3.3* 3.3* 4.0  CL 104 101 99*  CO2 26 29 30   GLUCOSE 143* 167* 212*  BUN 9 8 9   CREATININE 0.78 0.81 0.76  CALCIUM 8.9 8.5* 8.9    GFR: Estimated Creatinine Clearance: 141.3 mL/min (by C-G formula based on SCr of 0.76 mg/dL).  Liver Function Tests:  Recent Labs Lab 06/21/16 1143  AST 19  ALT 20  ALKPHOS 60  BILITOT 0.5  PROT 6.4*  ALBUMIN 3.3*   No results for input(s): LIPASE, AMYLASE in the last 168 hours. No results  for input(s): AMMONIA in the last 168 hours.  Coagulation Profile: No results for input(s): INR, PROTIME in the last 168 hours.  Cardiac Enzymes: No results for input(s): CKTOTAL, CKMB, CKMBINDEX, TROPONINI in the last 168 hours.  BNP (last 3 results) No results for input(s): PROBNP in the last 8760 hours.  HbA1C: No results for input(s): HGBA1C in the last 72 hours.  CBG:  Recent Labs Lab 06/22/16 2247  GLUCAP 314*    Lipid Profile: No results for input(s): CHOL, HDL, LDLCALC, TRIG, CHOLHDL, LDLDIRECT in the last 72 hours.  Thyroid Function Tests: No results for input(s): TSH, T4TOTAL, FREET4, T3FREE, THYROIDAB in the last 72 hours.  Anemia Panel: No results for input(s): VITAMINB12, FOLATE, FERRITIN, TIBC, IRON, RETICCTPCT in the last 72 hours.  Urine analysis:    Component Value Date/Time   COLORURINE YELLOW 11/21/2014 0707   APPEARANCEUR CLOUDY (A) 11/21/2014 0707   LABSPEC 1.024 11/21/2014 0707   PHURINE 6.0 11/21/2014 0707   GLUCOSEU NEGATIVE 11/21/2014 0707   GLUCOSEU NEG mg/dL 10/28/2007 2049  HGBUR TRACE (A) 11/21/2014 0707   BILIRUBINUR NEGATIVE 11/21/2014 0707   KETONESUR NEGATIVE 11/21/2014 0707   PROTEINUR NEGATIVE 11/21/2014 0707   UROBILINOGEN 1.0 11/21/2014 0707   NITRITE NEGATIVE 11/21/2014 0707   LEUKOCYTESUR NEGATIVE 11/21/2014 0707    Sepsis Labs: Lactic Acid, Venous No results found for: LATICACIDVEN  MICROBIOLOGY: No results found for this or any previous visit (from the past 240 hour(s)).  RADIOLOGY STUDIES/RESULTS: Dg Chest Portable 1 View  Result Date: 06/21/2016 CLINICAL DATA:  Shortness of breath . EXAM: PORTABLE CHEST 1 VIEW COMPARISON:  05/09/2016. FINDINGS: Cardiomegaly with mild bilateral interstitial prominence suggesting mild CHF. Small bilateral pleural effusions cannot be excluded. No pneumothorax . IMPRESSION: Cardiomegaly with very mild interstitial prominence suggesting mild interstitial edema. Small bilateral pleural  effusions cannot be excluded . Electronically Signed   By: Marcello Moores  Register   On: 06/21/2016 12:33     LOS: 1 day   Oren Binet, MD  [facility] Pager:[phone]  If 7PM-7AM, please contact night-coverage [email or web address] Password [facility] 06/24/2016, 10:42 AM

## 2016-06-24 NOTE — Progress Notes (Signed)
Inpatient Diabetes Program Recommendations  AACE/ADA: New Consensus Statement on Inpatient Glycemic Control (2015)  Target Ranges:  Prepandial:   less than 140 mg/dL      Peak postprandial:   less than 180 mg/dL (1-2 hours)      Critically ill patients:  140 - 180 mg/dL   Lab Results  Component Value Date   GLUCAP 314 (H) 06/22/2016   HGBA1C 6.4 05/03/2016    Review of Glycemic Control:  Results for KHRISTIAN, ARTIGA (MRN [id number]) as of 06/24/2016 13:12  Ref. Range 06/22/2016 22:47  Glucose-Capillary Latest Ref Range: 65 - 99 mg/dL 314 (H)   Diabetes history: None Outpatient Diabetes medications: None Current orders for Inpatient glycemic control:  Solumedrol 40 mg IV q 8 hours  Inpatient Diabetes Program Recommendations:    Note that lab glucose elevated.  May consider adding CBG checks tid with meals and HS and Novolog resistant tid with meals and HS.    Thanks, Adah Perl, RN, BC-ADM Inpatient Diabetes Coordinator Pager [phone] (8a-5p)

## 2016-06-25 DIAGNOSIS — I10 Essential (primary) hypertension: Secondary | ICD-10-CM

## 2016-06-25 LAB — BASIC METABOLIC PANEL
ANION GAP: 7 (ref 5–15)
BUN: 14 mg/dL (ref 6–20)
CO2: 30 mmol/L (ref 22–32)
Calcium: 8.6 mg/dL — ABNORMAL LOW (ref 8.9–10.3)
Chloride: 100 mmol/L — ABNORMAL LOW (ref 101–111)
Creatinine, Ser: 0.77 mg/dL (ref 0.44–1.00)
GFR calc Af Amer: >60 mL/min (ref >60)
Glucose, Bld: 222 mg/dL — ABNORMAL HIGH (ref 65–99)
Potassium: 4.4 mmol/L (ref 3.5–5.1)
SODIUM: 137 mmol/L (ref 135–145)

## 2016-06-25 MED ORDER — PREDNISONE 10 MG PO TABS: ORAL_TABLET | ORAL | 0 refills | Status: DC

## 2016-06-25 MED ORDER — HYDRALAZINE HCL 50 MG PO TABS
50.0000 mg | ORAL_TABLET | Freq: Two times a day (BID) | ORAL | Status: DC
  Filled 2016-06-25: qty 1

## 2016-06-25 MED ORDER — PREDNISONE 20 MG PO TABS: 40.0000 mg | ORAL_TABLET | Freq: Every day | ORAL | Status: DC

## 2016-06-25 MED ORDER — AZITHROMYCIN 500 MG PO TABS: 500.0000 mg | ORAL_TABLET | Freq: Every day | ORAL | 0 refills | Status: DC

## 2016-06-25 MED ORDER — LISINOPRIL 40 MG PO TABS: 40.0000 mg | ORAL_TABLET | Freq: Every day | ORAL | 0 refills | Status: DC

## 2016-06-25 NOTE — Discharge Summary (Signed)
PATIENT DETAILS Name: April [name] Age: 46 y.o. Sex: female Date of Birth: Apr 08, 1971 MRN: [id number]. Admitting Physician: April Quill, DO [id number], April Laity, MD  Admit Date: 06/22/2016 Discharge date: 06/25/2016  Recommendations for Outpatient Follow-up:  1. Follow up with PCP in 1-2 weeks 2. Please obtain BMP/CBC in one week 3. Repeat chest x-ray in 4-6 weeks to document resolution of the interstitial changes. 4. Will need further optimization of her hypertensive regimen in the outpatient setting.  Admitted From:  Home  Disposition: [place]: No  Equipment/Devices: None  Discharge Condition: Stable  CODE STATUS: FULL CODE  Diet recommendation:  Heart Healthy / Carb Modified   Brief Summary: See H&P, Labs, Consult and Test reports for all details in brief,Patient is a 46 y.o. female with history of severe persistent asthma on Xolair infusion as outpatient, morbid obesity, hypertension presented with worsening shortness of breath, found to have asthma exacerbation and admitted for further evaluation and treatment  Brief Hospital Course: Severe persistent Asthma with exacerbation: Improved, claims that she is now back to her baseline. Her lung exam has significantly improved with only a few scattered rhonchi, she is moving air well. She was managed with IV Solu-Medrol, scheduled bronchodilators and empiric Rocephin and Zithromax. Since she is back to her usual baseline, she is being discharged home today in a stable manner-plans are to taper prednisone, continue Zithromax for 2 more days and continue her usual regimen. A follow-up appointment at pulmonology has been scheduled by this M.D. on 3/9. She is to resume Xolair infusions at the discretion of her primary pulmonologist..  ? Community acquired pneumonia: Chest x-ray shows interstitial pattern-no clinical features suggestive of CHF. She was empirically covered with  Rocephin and Zithromax-on  discharge, she will continue Zithromax for 21 days to complete a 5 day course of empiric antibiotics. 2-D echocardiogram showed preserved ejection fraction.   Hypertension: Fluctuating-but better control over the past 24 hours-continue Aldactone, amlodipine, lisinopril and hydralazine. Lisinopril dosage has been increased to 40 mg, and hydralazine has been started during this hospital stay.Given history of hypokalemia, I have discontinued HCTZ.   Hypokalemia: Appears to be a chronic issue-not sure if she has underlying primary hyperaldosteronism-she is already on Aldactone-and getting a plasma renin/aldosterone activity ratio would probably be inaccurate. Would defer further workup to her PCP at this time. Her potassium levels have been stable for the past 24 hours on Aldactone-HCTZ has been discontinued.  Probable obstructive sleep apnea: Polysomnography scheduled for next month per patient. She was unable to tolerate CPAP while inpatient.  Morbid obesity: Encouraged weight loss  GERD: Continue Protonix  MDD: Stable, continue Zoloft  Tobacco abuse: Counseled, I'm not sure if she has desire to quit at this time.  Procedures/Studies: Echo 2/26 Left ventricle: The cavity size was normal. Wall thickness was   increased in a pattern of mild LVH. Systolic function was normal.   The estimated ejection fraction was in the range of 60% to 65%.   Wall motion was normal; there were no regional wall motion   abnormalities. Left ventricular diastolic function parameters   were normal. - Atrial septum: No defect or patent foramen ovale was identified.  Discharge Diagnoses:  Principal Problem:   Asthma exacerbation Active Problems:   Obstructive sleep apnea   Asthma, chronic obstructive, without status asthmaticus (HCC)   Malignant hypertension due to primary aldosteronism [facility])   Hypertensive urgency, malignant   Pulmonary edema   Hypertensive urgency   Discharge  Instructions:  Activity:  As tolerated with Full fall precautions use walker/cane & assistance as needed  Discharge Instructions    Call MD for:  difficulty breathing, headache or visual disturbances    Complete by:  As directed    Diet - low sodium heart healthy    Complete by:  As directed    Discharge instructions    Complete by:  As directed    Follow with Primary MD  April Ends, MD  In 1 week  Follow with Pulmonology (April [name]-NP) on 3/9 at 9:30 am  Please keep your sleep study appointment.  Ask your Primary MD or your Pulmonologist to Repeat chest Xray in 4-6 weeks Please get a complete blood count and chemistry panel checked by your Primary MD at your next visit, and again as instructed by your Primary MD.  Get Medicines reviewed and adjusted:  Please take all your medications with you for your next visit with your Primary MD  Laboratory/radiological data: Please request your Primary MD to go over all hospital tests and procedure/radiological results at the follow up, please ask your Primary MD to get all Hospital records sent to his/her office.  In some cases, they will be blood work, cultures and biopsy results pending at the time of your discharge. Please request that your primary care M.D. follows up on these results.  Also Note the following: If you experience worsening of your admission symptoms, develop shortness of breath, life threatening emergency, suicidal or homicidal thoughts you must seek medical attention immediately by calling 911 or calling your MD immediately  if symptoms less severe.  You must read complete instructions/literature along with all the possible adverse reactions/side effects for all the Medicines you take and that have been prescribed to you. Take any new Medicines after you have completely understood and accpet all the possible adverse reactions/side effects.   Do not drive when taking Pain medications or sleeping medications  (Benzodaizepines)  Do not take more than prescribed Pain, Sleep and Anxiety Medications. It is not advisable to combine anxiety,sleep and pain medications without talking with your primary care practitioner  Special Instructions: If you have smoked or chewed Tobacco  in the last 2 yrs please stop smoking, stop any regular Alcohol  and or any Recreational drug use.  Wear Seat belts while driving.  Please note: You were cared for by a hospitalist during your hospital stay. Once you are discharged, your primary care physician will handle any further medical issues. Please note that NO REFILLS for any discharge medications will be authorized once you are discharged, as it is imperative that you return to your primary care physician (or establish a relationship with a primary care physician if you do not have one) for your post hospital discharge needs so that they can reassess your need for medications and monitor your lab values.   Increase activity slowly    Complete by:  As directed      Allergies as of 06/25/2016   No Known Allergies     Medication List    STOP taking these medications   hydrochlorothiazide 25 MG tablet Commonly known as:  HYDRODIURIL   ibuprofen 200 MG tablet Commonly known as:  ADVIL,MOTRIN     TAKE these medications   acetaminophen 500 MG tablet Commonly known as:  TYLENOL Take 2 tablets (1,000 mg total) by mouth every 6 (six) hours as needed.   albuterol (2.5 MG/3ML) 0.083% nebulizer solution Commonly known as:  PROVENTIL Take 3  mLs (2.5 mg total) by nebulization every 4 (four) hours as needed for wheezing or shortness of breath.   albuterol 108 (90 Base) MCG/ACT inhaler Commonly known as:  PROAIR HFA Inhale 1-2 puffs into the lungs every 6 (six) hours as needed for wheezing or shortness of breath.   amLODipine 10 MG tablet Commonly known as:  NORVASC Take 1 tablet (10 mg total) by mouth daily.   azithromycin 500 MG tablet Commonly known as:   ZITHROMAX Take 1 tablet (500 mg total) by mouth daily.   benzonatate 100 MG capsule Commonly known as:  TESSALON Take 1 capsule (100 mg total) by mouth 3 (three) times daily as needed for cough.   esomeprazole 40 MG capsule Commonly known as:  NEXIUM Take 1 capsule (40 mg total) by mouth daily.   fluticasone 50 MCG/ACT nasal spray Commonly known as:  FLONASE Place 2 sprays into both nostrils daily.   guaiFENesin 600 MG 12 hr tablet Commonly known as:  MUCINEX Take 2 tablets (1,200 mg total) by mouth 2 (two) times daily. What changed:  when to take this  reasons to take this   ipratropium 0.02 % nebulizer solution Commonly known as:  ATROVENT Take 2.5 mLs (0.5 mg total) by nebulization 4 (four) times daily as needed for wheezing or shortness of breath.   lisinopril 40 MG tablet Commonly known as:  PRINIVIL,ZESTRIL Take 1 tablet (40 mg total) by mouth daily. What changed:  medication strength  how much to take   loratadine 10 MG tablet Commonly known as:  CLARITIN Take 1 tablet (10 mg total) by mouth daily.   metFORMIN 500 MG tablet Commonly known as:  GLUCOPHAGE Take 1 tablet (500 mg total) by mouth 2 (two) times daily with a meal.   mometasone-formoterol 200-5 MCG/ACT Aero Commonly known as:  DULERA Inhale 2 puffs into the lungs 2 (two) times daily.   nicotine 14 mg/24hr patch Commonly known as:  NICODERM CQ - dosed in mg/24 hours Place 14 mg onto the skin daily as needed (SMOKING). Wednesdays and Sundays   predniSONE 10 MG tablet Commonly known as:  DELTASONE Take 4 tablets (40 mg) daily for 2 days, then, Take 3 tablets (30 mg) daily for 2 days, then, Take 2 tablets (20 mg) daily for 2 days, then, Take 1 tablets (10 mg) daily for 1 days, then stop   sertraline 50 MG tablet Commonly known as:  ZOLOFT Take 1 tablet (50 mg total) by mouth daily.   spironolactone 50 MG tablet Commonly known as:  ALDACTONE Take 2 tablets (100 mg total) by mouth daily.    Tiotropium Bromide Monohydrate 2.5 MCG/ACT Aers Commonly known as:  SPIRIVA RESPIMAT Inhale 2 puffs into the lungs daily.   traZODone 50 MG tablet Commonly known as:  DESYREL Take 0.5-1 tablets (25-50 mg total) by mouth at bedtime as needed for sleep.      Follow-up Information    April Ends, MD. Schedule an appointment as soon as possible for a visit in 1 week(s).   Specialty:  Family Medicine Contact information: [address] [place] [address] [phone]        Rexene Edison, NP Follow up on 07/05/2016.   Specialty:  Pulmonary Disease Why:  appointment at 9:30 am.  Contact information: [address] [address] [phone]          No Known Allergies    Consultations:   None  Other Procedures/Studies: Dg Chest Portable 1 View  Result Date: 06/21/2016 CLINICAL DATA:  Shortness of breath . EXAM: PORTABLE CHEST 1 VIEW COMPARISON:  05/09/2016. FINDINGS: Cardiomegaly with mild bilateral interstitial prominence suggesting mild CHF. Small bilateral pleural effusions cannot be excluded. No pneumothorax . IMPRESSION: Cardiomegaly with very mild interstitial prominence suggesting mild interstitial edema. Small bilateral pleural effusions cannot be excluded . Electronically Signed   By: Marcello Moores  Register   On: 06/21/2016 12:33     TODAY-DAY OF DISCHARGE:  Subjective:   April [name] today has no headache,no chest abdominal pain,no new weakness tingling or numbness, feels much better wants to go home today.   Objective:   Blood pressure (!) 173/97, pulse 96, temperature 98.2 F (36.8 C), temperature source Oral, resp. rate 20, height 5\' 6"  (1.676 m), weight (!) 157.9 kg (348 lb), last menstrual period 04/23/2016, SpO2 100 %.  Intake/Output Summary (Last 24 hours) at 06/25/16 1016 Last data filed at 06/24/16 1700  Gross per 24 hour  Intake              240 ml  Output                0 ml  Net              240 ml   Filed Weights    06/22/16 1445 06/22/16 2325  Weight: (!) 154.2 kg (340 lb) (!) 157.9 kg (348 lb)    Exam: Awake Alert, Oriented *3, No new F.N deficits, Normal affect [place].AT,PERRAL Supple Neck,No JVD, No cervical lymphadenopathy appriciated.  Symmetrical Chest wall movement, Good air movement bilaterally, CTAB RRR,No Gallops,Rubs or new Murmurs, No Parasternal Heave +ve B.Sounds, Abd Soft, Non tender, No organomegaly appriciated, No rebound -guarding or rigidity. No Cyanosis, Clubbing or edema, No new Rash or bruise   PERTINENT RADIOLOGIC STUDIES: Dg Chest Portable 1 View  Result Date: 06/21/2016 CLINICAL DATA:  Shortness of breath . EXAM: PORTABLE CHEST 1 VIEW COMPARISON:  05/09/2016. FINDINGS: Cardiomegaly with mild bilateral interstitial prominence suggesting mild CHF. Small bilateral pleural effusions cannot be excluded. No pneumothorax . IMPRESSION: Cardiomegaly with very mild interstitial prominence suggesting mild interstitial edema. Small bilateral pleural effusions cannot be excluded . Electronically Signed   By: Marcello Moores  Register   On: 06/21/2016 12:33     PERTINENT LAB RESULTS: CBC:  Recent Labs  06/23/16 0346  WBC 21.4*  HGB 11.4*  HCT 36.8  PLT 318   CMET CMP     Component Value Date/Time   NA 137 06/25/2016 0342   K 4.4 06/25/2016 0342   CL 100 (L) 06/25/2016 0342   CO2 30 06/25/2016 0342   GLUCOSE 222 (H) 06/25/2016 0342   BUN 14 06/25/2016 0342   CREATININE 0.77 06/25/2016 0342   CREATININE 0.56 05/21/2016 1708   CALCIUM 8.6 (L) 06/25/2016 0342   PROT 6.4 (L) 06/21/2016 1143   ALBUMIN 3.3 (L) 06/21/2016 1143   AST 19 06/21/2016 1143   ALT 20 06/21/2016 1143   ALKPHOS 60 06/21/2016 1143   BILITOT 0.5 06/21/2016 1143   GFRNONAA >60 06/25/2016 0342   GFRNONAA >89 05/21/2016 1708   GFRAA >60 06/25/2016 0342   GFRAA >89 05/21/2016 1708    GFR Estimated Creatinine Clearance: 141.3 mL/min (by C-G formula based on SCr of 0.77 mg/dL). No results for input(s): LIPASE,  AMYLASE in the last 72 hours. No results for input(s): CKTOTAL, CKMB, CKMBINDEX, TROPONINI in the last 72 hours. Invalid input(s): POCBNP No results for input(s): DDIMER in the last 72 hours. No results for input(s):  HGBA1C in the last 72 hours. No results for input(s): CHOL, HDL, LDLCALC, TRIG, CHOLHDL, LDLDIRECT in the last 72 hours. No results for input(s): TSH, T4TOTAL, T3FREE, THYROIDAB in the last 72 hours.  Invalid input(s): FREET3 No results for input(s): VITAMINB12, FOLATE, FERRITIN, TIBC, IRON, RETICCTPCT in the last 72 hours. Coags: No results for input(s): INR in the last 72 hours.  Invalid input(s): PT Microbiology: No results found for this or any previous visit (from the past 240 hour(s)).  FURTHER DISCHARGE INSTRUCTIONS:  Get Medicines reviewed and adjusted: Please take all your medications with you for your next visit with your Primary MD  Laboratory/radiological data: Please request your Primary MD to go over all hospital tests and procedure/radiological results at the follow up, please ask your Primary MD to get all Hospital records sent to his/her office.  In some cases, they will be blood work, cultures and biopsy results pending at the time of your discharge. Please request that your primary care M.D. goes through all the records of your hospital data and follows up on these results.  Also Note the following: If you experience worsening of your admission symptoms, develop shortness of breath, life threatening emergency, suicidal or homicidal thoughts you must seek medical attention immediately by calling 911 or calling your MD immediately  if symptoms less severe.  You must read complete instructions/literature along with all the possible adverse reactions/side effects for all the Medicines you take and that have been prescribed to you. Take any new Medicines after you have completely understood and accpet all the possible adverse reactions/side effects.   Do not  drive when taking Pain medications or sleeping medications (Benzodaizepines)  Do not take more than prescribed Pain, Sleep and Anxiety Medications. It is not advisable to combine anxiety,sleep and pain medications without talking with your primary care practitioner  Special Instructions: If you have smoked or chewed Tobacco  in the last 2 yrs please stop smoking, stop any regular Alcohol  and or any Recreational drug use.  Wear Seat belts while driving.  Please note: You were cared for by a hospitalist during your hospital stay. Once you are discharged, your primary care physician will handle any further medical issues. Please note that NO REFILLS for any discharge medications will be authorized once you are discharged, as it is imperative that you return to your primary care physician (or establish a relationship with a primary care physician if you do not have one) for your post hospital discharge needs so that they can reassess your need for medications and monitor your lab values.  Total Time spent coordinating discharge including counseling, education and face to face time equals 45 minutes.  SignedOren Binet 06/25/2016 10:16 AM

## 2016-06-25 NOTE — Progress Notes (Signed)
[name] to be D/C'd Home per MD order. Discussed with the patient and all questions fully answered.    VVS, Skin clean, dry and intact without evidence of skin break down, no evidence of skin tears noted.  IV catheter discontinued intact. Site without signs and symptoms of complications. Dressing and pressure applied.  An After Visit Summary was printed and given to the patient.  Patient escorted via [place], and D/C home via private auto.  Cyndra Numbers  06/25/2016 4:31 PM

## 2016-06-25 NOTE — Progress Notes (Signed)
RT NOTE:  During RT visit to admin PRN Albuterol it was noted that patient did not wear CPAP tonight. She is currently wearing no O2 device.

## 2016-06-27 ENCOUNTER — Ambulatory Visit (HOSPITAL_BASED_OUTPATIENT_CLINIC_OR_DEPARTMENT_OTHER): Payer: Medicaid Other | Attending: Family Medicine | Admitting: Internal Medicine

## 2016-06-27 VITALS — Ht 66.0 in | Wt 340.0 lb

## 2016-06-27 DIAGNOSIS — F5101 Primary insomnia: Secondary | ICD-10-CM

## 2016-06-27 DIAGNOSIS — I10 Essential (primary) hypertension: Secondary | ICD-10-CM | POA: Insufficient documentation

## 2016-06-27 DIAGNOSIS — Z6841 Body Mass Index (BMI) 40.0 and over, adult: Secondary | ICD-10-CM | POA: Diagnosis not present

## 2016-06-27 DIAGNOSIS — E669 Obesity, unspecified: Secondary | ICD-10-CM | POA: Diagnosis not present

## 2016-06-27 DIAGNOSIS — G4736 Sleep related hypoventilation in conditions classified elsewhere: Secondary | ICD-10-CM | POA: Diagnosis not present

## 2016-06-27 DIAGNOSIS — G4733 Obstructive sleep apnea (adult) (pediatric): Secondary | ICD-10-CM | POA: Diagnosis not present

## 2016-07-05 ENCOUNTER — Inpatient Hospital Stay: Payer: Medicaid Other | Admitting: Adult Health

## 2016-07-05 ENCOUNTER — Telehealth: Payer: Self-pay | Admitting: Internal Medicine

## 2016-07-05 NOTE — Telephone Encounter (Signed)
Called pt., I noticed it's been a month since she been in for her xolair. Wanted to make sure she knew we had it. Nothing further needed.

## 2016-07-06 NOTE — Procedures (Signed)
  Patient Name: April [name], Shiffer Date: 06/27/2016 Gender: Female D.O.B: December 11, 1970 Age (years): 50 Referring Provider: Adriana Mccallum Funches Height (inches): 15 Interpreting Physician: Baird Lyons MD, ABSM Weight (lbs): 340 RPSGT: [name] BMI: 54 MRN: [id number] Neck Size: 18.50 CLINICAL INFORMATION Sleep Study Type: NPSG  Indication for sleep study: Excessive Daytime Sleepiness, Hypertension, Morning Headaches, Obesity, OSA, Witnessed Apneas  Epworth Sleepiness Score: 13   Most recent polysomnogram dated 03/31/2007 revealed an AHI of 5.8/h and RDI of 7.8/h. SLEEP STUDY TECHNIQUE As per the AASM Manual for the Scoring of Sleep and Associated Events v2.3 (April 2016) with a hypopnea requiring 4% desaturations.  The channels recorded and monitored were frontal, central and occipital EEG, electrooculogram (EOG), submentalis EMG (chin), nasal and oral airflow, thoracic and abdominal wall motion, anterior tibialis EMG, snore microphone, electrocardiogram, and pulse oximetry.  MEDICATIONS Medications self-administered by patient taken the night of the study : none reported  SLEEP ARCHITECTURE The study was initiated at 10:55:26 PM and ended at 5:15:18 AM.  Sleep onset time was 9.1 minutes and the sleep efficiency was 74.6%. The total sleep time was 283.5 minutes.  Stage REM latency was 131.5 minutes.  The patient spent 29.28% of the night in stage N1 sleep, 52.73% in stage N2 sleep, 0.00% in stage N3 and 17.99% in REM.  Alpha intrusion was absent.  Supine sleep was 4.41%.  RESPIRATORY PARAMETERS The overall apnea/hypopnea index (AHI) was 11.0 per hour. There were 20 total apneas, including 10 obstructive, 8 central and 2 mixed apneas. There were 32 hypopneas and 60 RERAs.  The AHI during Stage REM sleep was 60.0 per hour.  AHI while supine was 0.0 per hour.  The mean oxygen saturation was 94.95%. The minimum SpO2 during sleep was 74.00%.  Loud snoring was noted  during this study.  CARDIAC DATA The 2 lead EKG demonstrated sinus rhythm. The mean heart rate was 93.33 beats per minute. Other EKG findings include: PVCs.  LEG MOVEMENT DATA The total PLMS were 7 with a resulting PLMS index of 1.48. Associated arousal with leg movement index was 0.2 .  IMPRESSIONS - Mild obstructive sleep apnea occurred during this study (AHI = 11.0/h). - No significant central sleep apnea occurred during this study (CAI = 1.7/h). - Moderate oxygen desaturation was noted during this study (Min O2 = 74.00%). - The patient snored with Loud snoring volume. - EKG findings include PVCs. - Clinically significant periodic limb movements did not occur during sleep. No significant associated arousals. - Tech reported patient drinking juice and water through the study, frequent coughing, bathroom x 2.  DIAGNOSIS - Obstructive Sleep Apnea (327.23 [G47.33 ICD-10]) - Nocturnal Hypoxemia (327.26 [G47.36 ICD-10])  RECOMMENDATIONS - Therapeutic CPAP titration to determine optimal pressure required to alleviate sleep disordered breathing. - Avoid alcohol, sedatives and other CNS depressants that may worsen sleep apnea and disrupt normal sleep architecture. - Sleep hygiene should be reviewed to assess factors that may improve sleep quality. - Weight management and regular exercise should be initiated or continued if appropriate.  [Electronically signed] 07/06/2016 02:25 PM  Baird Lyons MD, [place], American Board of Sleep Medicine   NPI: [id number]  [place], American Board of Sleep Medicine  ELECTRONICALLY SIGNED ON:  07/06/2016, 2:24 PM [place] PH: [phone]   FX: [phone] [place]

## 2016-07-09 ENCOUNTER — Other Ambulatory Visit: Payer: Self-pay | Admitting: Family Medicine

## 2016-07-09 DIAGNOSIS — G4733 Obstructive sleep apnea (adult) (pediatric): Secondary | ICD-10-CM

## 2016-07-10 ENCOUNTER — Telehealth: Payer: Self-pay

## 2016-07-10 NOTE — Telephone Encounter (Signed)
Pt was called and a VM was left informing pt to return phone call for lab results. 

## 2016-07-10 NOTE — Telephone Encounter (Signed)
Pt returned phone call and was informed of lab results. 

## 2016-07-12 ENCOUNTER — Ambulatory Visit: Payer: Medicaid Other

## 2016-07-14 ENCOUNTER — Encounter (HOSPITAL_COMMUNITY): Payer: Self-pay | Admitting: *Deleted

## 2016-07-14 ENCOUNTER — Ambulatory Visit (HOSPITAL_COMMUNITY)
Admission: EM | Admit: 2016-07-14 | Discharge: 2016-07-14 | Disposition: A | Discharge status: Home / Self Care | Payer: Medicaid Other | Attending: Radiology | Admitting: Radiology

## 2016-07-14 DIAGNOSIS — M545 Low back pain, unspecified: Secondary | ICD-10-CM

## 2016-07-14 MED ORDER — KETOROLAC TROMETHAMINE 60 MG/2ML IM SOLN
INTRAMUSCULAR | Status: AC
  Filled 2016-07-14: qty 2

## 2016-07-14 MED ORDER — KETOROLAC TROMETHAMINE 30 MG/ML IJ SOLN
30.0000 mg | Freq: Once | INTRAMUSCULAR | Status: AC
  Administered 2016-07-14: 30 mg via INTRAMUSCULAR

## 2016-07-14 MED ORDER — CYCLOBENZAPRINE HCL 10 MG PO TABS: 10.0000 mg | ORAL_TABLET | Freq: Two times a day (BID) | ORAL | 0 refills | Status: DC | PRN

## 2016-07-14 MED ORDER — DICLOFENAC SODIUM 75 MG PO TBEC: 75.0000 mg | DELAYED_RELEASE_TABLET | Freq: Two times a day (BID) | ORAL | 0 refills | Status: AC

## 2016-07-14 MED ORDER — KETOROLAC TROMETHAMINE 30 MG/ML IJ SOLN
INTRAMUSCULAR | Status: AC
  Filled 2016-07-14: qty 1

## 2016-07-14 MED ORDER — CYCLOBENZAPRINE HCL 10 MG PO TABS: 10.0000 mg | ORAL_TABLET | Freq: Two times a day (BID) | ORAL | 0 refills | Status: AC | PRN

## 2016-07-14 NOTE — ED Provider Notes (Signed)
CSN: [id number]     Arrival date & time 07/14/16  1438 History   None    Chief Complaint  Patient presents with  . Back Pain   (Consider location/radiation/quality/duration/timing/severity/associated sxs/prior Treatment) 46 y.o. female presents with back pain X 3 days that occurred when she was getting up off the couch . Condition is acute  in nature. Condition is made better by aleve for 2 hours. Condition is made worse by movoement. Patient reports temporarily relief with advil  prior to there arrival at this facility. Patient states that she has had a history of back pain but has not experienced  any pain since her breast reduction. Patient denies any pain to spin or diffiuclty with urination.        Past Medical History:  Diagnosis Date  . Acanthosis nigricans   . Arthritis   . Asthma    Followed by Dr. Annamaria Boots (pulmonology); receives every other week omalizumab injections; has frequent exacerbations  . COPD (chronic obstructive pulmonary disease) ([place])    PFTs in 2002, FEV1/FVC 65, no post bronchodilater test done  . Depression   . GERD (gastroesophageal reflux disease)   . Headache(784.0)   . Helicobacter pylori (H. pylori) infection   . Hypertension, essential   . Insomnia   . Menorrhagia   . Morbid obesity ([place])   . Obesity   . Seasonal allergies   . Shortness of breath   . Sleep apnea    Sleep study 2008 - mild OSA, not enough events to titrate CPAP  . Tobacco user    Past Surgical History:  Procedure Laterality Date  . BREAST REDUCTION SURGERY  09/2011  . TUBAL LIGATION  1996   bilateral   Family History  Problem Relation Age of Onset  . Hypertension Mother   . Asthma Daughter   . Cancer Paternal Aunt   . Asthma Maternal Grandmother    Social History  Substance Use Topics  . Smoking status: Former Smoker    Years: 18.00    Types: Cigarettes  . Smokeless tobacco: Never Used  . Alcohol use No   OB History    No data available     Review of Systems   Constitutional: Negative for chills and fever.  HENT: Negative for ear pain and sore throat.   Eyes: Negative for pain and visual disturbance.  Respiratory: Negative for cough and shortness of breath.   Cardiovascular: Negative for chest pain and palpitations.  Gastrointestinal: Negative for abdominal pain and vomiting.  Genitourinary: Negative for hematuria.  Musculoskeletal: Positive for back pain ( middle right). Negative for arthralgias.  Skin: Negative for color change and rash.  Neurological: Negative for seizures and syncope.  All other systems reviewed and are negative.   Allergies  Patient has no known allergies.  Home Medications   Prior to Admission medications   Medication Sig Start Date End Date Taking? Authorizing Provider  acetaminophen (TYLENOL) 500 MG tablet Take 2 tablets (1,000 mg total) by mouth every 6 (six) hours as needed. 08/30/15   [name], MD  albuterol [facility] HFA) [phone] Base) MCG/ACT inhaler Inhale 1-2 puffs into the lungs every 6 (six) hours as needed for wheezing or shortness of breath. 06/18/16   Harrie Foreman, MD  albuterol (PROVENTIL) (2.5 MG/3ML) 0.083% nebulizer solution Take 3 mLs (2.5 mg total) by nebulization every 4 (four) hours as needed for wheezing or shortness of breath. 06/06/16   Deneise Lever, MD  amLODipine (NORVASC) 10 MG tablet Take  1 tablet (10 mg total) by mouth daily. 05/03/16   Josalyn Funches, MD  azithromycin (ZITHROMAX) 500 MG tablet Take 1 tablet (500 mg total) by mouth daily. 06/25/16   Shanker Kristeen Mans, MD  benzonatate (TESSALON) 100 MG capsule Take 1 capsule (100 mg total) by mouth 3 (three) times daily as needed for cough. 05/07/16   Geradine Girt, DO  cyclobenzaprine (FLEXERIL) 10 MG tablet Take 1 tablet (10 mg total) by mouth 2 (two) times daily as needed for muscle spasms. 07/14/16 07/21/16  Jacqualine Mau, NP  diclofenac (VOLTAREN) 75 MG EC tablet Take 1 tablet (75 mg total) by mouth 2 (two) times daily. 07/14/16  07/21/16  Jacqualine Mau, NP  esomeprazole (NEXIUM) 40 MG capsule Take 1 capsule (40 mg total) by mouth daily. 06/10/16   Deneise Lever, MD  fluticasone (FLONASE) 50 MCG/ACT nasal spray Place 2 sprays into both nostrils daily. 12/27/15   Elsie Stain, MD  guaiFENesin (MUCINEX) 600 MG 12 hr tablet Take 2 tablets (1,200 mg total) by mouth 2 (two) times daily. Patient taking differently: Take 1,200 mg by mouth 2 (two) times daily as needed for cough or to loosen phlegm.  05/07/16   Geradine Girt, DO  ipratropium (ATROVENT) 0.02 % nebulizer solution Take 2.5 mLs (0.5 mg total) by nebulization 4 (four) times daily as needed for wheezing or shortness of breath. 06/06/16   Deneise Lever, MD  lisinopril (PRINIVIL,ZESTRIL) 40 MG tablet Take 1 tablet (40 mg total) by mouth daily. 06/25/16   Shanker Kristeen Mans, MD  loratadine (CLARITIN) 10 MG tablet Take 1 tablet (10 mg total) by mouth daily. 08/30/15   [name], MD  metFORMIN (GLUCOPHAGE) 500 MG tablet Take 1 tablet (500 mg total) by mouth 2 (two) times daily with a meal. 05/07/16   Geradine Girt, DO  mometasone-formoterol (DULERA) 200-5 MCG/ACT AERO Inhale 2 puffs into the lungs 2 (two) times daily. 06/06/16   Deneise Lever, MD  nicotine (NICODERM CQ - DOSED IN MG/24 HOURS) 14 mg/24hr patch Place 14 mg onto the skin daily as needed (SMOKING). Wednesdays and Sundays    Historical Provider, MD  predniSONE (DELTASONE) 10 MG tablet Take 4 tablets (40 mg) daily for 2 days, then, Take 3 tablets (30 mg) daily for 2 days, then, Take 2 tablets (20 mg) daily for 2 days, then, Take 1 tablets (10 mg) daily for 1 days, then stop 06/25/16   Jonetta Osgood, MD  sertraline (ZOLOFT) 50 MG tablet Take 1 tablet (50 mg total) by mouth daily. 08/30/15   [name], MD  spironolactone (ALDACTONE) 50 MG tablet Take 2 tablets (100 mg total) by mouth daily. 05/21/16   Josalyn Funches, MD  Tiotropium Bromide Monohydrate (SPIRIVA RESPIMAT) 2.5 MCG/ACT AERS Inhale 2 puffs into  the lungs daily. 05/07/16   Geradine Girt, DO  traZODone (DESYREL) 50 MG tablet Take 0.5-1 tablets (25-50 mg total) by mouth at bedtime as needed for sleep. 05/21/16   Boykin Nearing, MD   Meds Ordered and Administered this Visit   Medications  ketorolac (TORADOL) 30 MG/ML injection 30 mg (not administered)    BP (!) 150/98 (BP Location: Right Arm)   Pulse 78   Temp 98.6 F (37 C) (Oral)   Resp 18   LMP 06/26/2016   SpO2 100%  No data found.   Physical Exam  Constitutional: She is oriented to person, place, and time. She appears well-developed and well-nourished.  HENT:  Head:  Normocephalic and atraumatic.  Eyes: Conjunctivae are normal.  Neck: Normal range of motion.  Pulmonary/Chest: Effort normal.  Musculoskeletal: Normal range of motion. She exhibits tenderness ( with palpation to right thoraic).  Neurological: She is alert and oriented to person, place, and time.  Skin: Skin is warm.  Psychiatric: She has a normal mood and affect.  Nursing note and vitals reviewed.   Urgent Care Course     Procedures (including critical care time)  Labs Review Labs Reviewed - No data to display  Imaging Review No results found.     MDM   1. Acute right-sided low back pain without sciatica        Jacqualine Mau, NP 07/14/16 1552

## 2016-07-14 NOTE — ED Triage Notes (Signed)
Pt  Reports  Back  Pain  For     sev  Days      Felt  A  Pop in her  Back  When  She  Was  Getting   Up  Off  [place]        She  Ambulates     With  A  Slow  Steady  Gait

## 2016-07-15 ENCOUNTER — Encounter: Payer: Self-pay | Admitting: Adult Health

## 2016-07-15 ENCOUNTER — Encounter (HOSPITAL_COMMUNITY): Payer: Self-pay | Admitting: Family Medicine

## 2016-07-15 ENCOUNTER — Ambulatory Visit (INDEPENDENT_AMBULATORY_CARE_PROVIDER_SITE_OTHER): Payer: Medicaid Other | Admitting: Adult Health

## 2016-07-15 ENCOUNTER — Emergency Department (HOSPITAL_COMMUNITY)
Admission: EM | Admit: 2016-07-15 | Discharge: 2016-07-15 | Disposition: A | Discharge status: Home / Self Care | Payer: Medicaid Other | Attending: Emergency Medicine | Admitting: Emergency Medicine

## 2016-07-15 DIAGNOSIS — Y939 Activity, unspecified: Secondary | ICD-10-CM | POA: Insufficient documentation

## 2016-07-15 DIAGNOSIS — I10 Essential (primary) hypertension: Secondary | ICD-10-CM | POA: Insufficient documentation

## 2016-07-15 DIAGNOSIS — Y999 Unspecified external cause status: Secondary | ICD-10-CM | POA: Diagnosis not present

## 2016-07-15 DIAGNOSIS — Y929 Unspecified place or not applicable: Secondary | ICD-10-CM | POA: Diagnosis not present

## 2016-07-15 DIAGNOSIS — J4551 Severe persistent asthma with (acute) exacerbation: Secondary | ICD-10-CM

## 2016-07-15 DIAGNOSIS — Z87891 Personal history of nicotine dependence: Secondary | ICD-10-CM | POA: Insufficient documentation

## 2016-07-15 DIAGNOSIS — J449 Chronic obstructive pulmonary disease, unspecified: Secondary | ICD-10-CM | POA: Insufficient documentation

## 2016-07-15 DIAGNOSIS — X58XXXA Exposure to other specified factors, initial encounter: Secondary | ICD-10-CM | POA: Diagnosis not present

## 2016-07-15 DIAGNOSIS — Z6841 Body Mass Index (BMI) 40.0 and over, adult: Secondary | ICD-10-CM

## 2016-07-15 DIAGNOSIS — M546 Pain in thoracic spine: Secondary | ICD-10-CM | POA: Insufficient documentation

## 2016-07-15 DIAGNOSIS — G4733 Obstructive sleep apnea (adult) (pediatric): Secondary | ICD-10-CM | POA: Diagnosis not present

## 2016-07-15 MED ORDER — METHOCARBAMOL 500 MG PO TABS: 500.0000 mg | ORAL_TABLET | Freq: Two times a day (BID) | ORAL | 0 refills | Status: DC

## 2016-07-15 MED ORDER — KETOROLAC TROMETHAMINE 30 MG/ML IJ SOLN
30.0000 mg | Freq: Once | INTRAMUSCULAR | Status: AC
  Administered 2016-07-15: 30 mg via INTRAMUSCULAR
  Filled 2016-07-15: qty 1

## 2016-07-15 MED ORDER — PREDNISONE 10 MG PO TABS: ORAL_TABLET | ORAL | 0 refills | Status: DC

## 2016-07-15 NOTE — Discharge Instructions (Signed)
Please read attached information. If you experience any new or worsening signs or symptoms please return to the emergency room for evaluation. Please follow-up with your primary care provider or specialist as discussed. Please use medication prescribed only as directed and discontinue taking if you have any concerning signs or symptoms.   °

## 2016-07-15 NOTE — Assessment & Plan Note (Signed)
Restart CPAP At bedtime   Plan  CPAP At bedtime   Wt loss

## 2016-07-15 NOTE — Patient Instructions (Addendum)
Discuss with Primary MD that lisinopril can cause your cough to be worse.  Restart Xolair .  Begin CPAP At bedtime  .  Wear for at least 4-6 hr each night  Work on weight loss.  Do not drive if sleepy.  Continue on Dulera 2 puffs Twice daily   Restart Spiriva daily .  Prednisone taper over next week.  Mucinex DM Twice daily  As needed  Cough/congestion  Follow up with Dr. Annamaria Boots  In 6- 8 weeks and As needed   Please contact office for sooner follow up if symptoms do not improve or worsen or seek emergency care

## 2016-07-15 NOTE — Assessment & Plan Note (Addendum)
Recurrent exacerbations w/ frequent admissions  Try to get back on Xolair and Spriiva  Short steroid taper, pt education on steroids   Plan  Patient Instructions  Discuss with Primary MD that lisinopril can cause your cough to be worse.  Restart Xolair .  Continue on Dulera 2 puffs Twice daily   Restart Spiriva daily .  Prednisone taper over next week.  Mucinex DM Twice daily  As needed  Cough/congestion  Follow up with Dr. Annamaria Boots  In 6- 8 weeks and As needed   Please contact office for sooner follow up if symptoms do not improve or worsen or seek emergency care

## 2016-07-15 NOTE — ED Provider Notes (Signed)
[facility] DEPT Provider Note   CSN: [id number] Arrival date & time: 07/15/16  1720  By signing my name below, I, Sonum Patel, attest that this documentation has been prepared under the direction and in the presence of [employer], PA-C . Electronically Signed: Ludger Nutting, Scribe. 07/15/16. 6:51 PM.  History   Chief Complaint Chief Complaint  Patient presents with  . Back Pain   The history is provided by the patient. No language interpreter was used.     HPI Comments: April [name] is a 46 y.o. female who presents to the Emergency Department complaining of constant right sided mid back pain that began 2 days ago. She describes her pain as an ache and throbbing sensation. She denies similar symptoms in the past. She states the pain is worse with movement and lying down. She states the pain began after lying on the couch; denies any known injury or trauma to the affected area. She was seen by an UC yesterday and was given an injection with temproary relief and prescribed oral Voltaren and Flexeril which she has taken without relief. She denies bowel/bladder incontinence, numbness, weakness, dysuria.   Past Medical History:  Diagnosis Date  . Acanthosis nigricans   . Arthritis   . Asthma    Followed by Dr. Annamaria Boots (pulmonology); receives every other week omalizumab injections; has frequent exacerbations  . COPD (chronic obstructive pulmonary disease) ([place])    PFTs in 2002, FEV1/FVC 65, no post bronchodilater test done  . Depression   . GERD (gastroesophageal reflux disease)   . Headache(784.0)   . Helicobacter pylori (H. pylori) infection   . Hypertension, essential   . Insomnia   . Menorrhagia   . Morbid obesity ([place])   . Obesity   . Seasonal allergies   . Shortness of breath   . Sleep apnea    Sleep study 2008 - mild OSA, not enough events to titrate CPAP  . Tobacco user     Patient Active Problem List   Diagnosis Date Noted  . Hypertensive urgency, malignant  06/22/2016  . Pulmonary edema 06/22/2016  . Hypertensive urgency 06/22/2016  . Malignant hypertension due to primary aldosteronism ([place]) 05/05/2016  . Lip laceration 05/05/2016  . Elevated hemoglobin A1c 05/05/2016  . Asthma exacerbation 11/10/2015  . GERD (gastroesophageal reflux disease) 08/30/2015  . Generalized anxiety disorder 08/30/2015  . Morbid obesity ([place])   . Seasonal allergic rhinitis 08/29/2013  . Tobacco abuse 10/07/2012  . Asthma, chronic obstructive, without status asthmaticus ([place]) 05/07/2012  . Knee pain, bilateral 04/25/2011  . Primary insomnia 03/14/2011  . Mild obstructive sleep apnea 12/19/2010  . Hypokalemia 08/13/2010  . Cervical back pain with evidence of disc disease 04/08/2008  . Essential hypertension 07/31/2006  . Morbid obesity with body mass index of 50.0-59.9 in adult ([place]) 06/17/2006  . Major depressive disorder, recurrent episode ([place]) 04/10/2006    Past Surgical History:  Procedure Laterality Date  . BREAST REDUCTION SURGERY  09/2011  . TUBAL LIGATION  1996   bilateral    OB History    No data available       Home Medications    Prior to Admission medications   Medication Sig Start Date End Date Taking? Authorizing Provider  acetaminophen (TYLENOL) 500 MG tablet Take 2 tablets (1,000 mg total) by mouth every 6 (six) hours as needed. 08/30/15   [name], MD  albuterol [facility] HFA) 108 [phone] Base) MCG/ACT inhaler Inhale 1-2 puffs into the lungs every 6 (six) hours as  needed for wheezing or shortness of breath. 06/18/16   Harrie Foreman, MD  albuterol (PROVENTIL) (2.5 MG/3ML) 0.083% nebulizer solution Take 3 mLs (2.5 mg total) by nebulization every 4 (four) hours as needed for wheezing or shortness of breath. 06/06/16   Deneise Lever, MD  amLODipine (NORVASC) 10 MG tablet Take 1 tablet (10 mg total) by mouth daily. 05/03/16   Josalyn Funches, MD  benzonatate (TESSALON) 100 MG capsule Take 1 capsule (100 mg total) by mouth 3 (three) times  daily as needed for cough. 05/07/16   Geradine Girt, DO  cyclobenzaprine (FLEXERIL) 10 MG tablet Take 1 tablet (10 mg total) by mouth 2 (two) times daily as needed for muscle spasms. Patient not taking: Reported on 07/15/2016 07/14/16 07/21/16  Jacqualine Mau, NP  diclofenac (VOLTAREN) 75 MG EC tablet Take 1 tablet (75 mg total) by mouth 2 (two) times daily. 07/14/16 07/21/16  Jacqualine Mau, NP  esomeprazole (NEXIUM) 40 MG capsule Take 1 capsule (40 mg total) by mouth daily. 06/10/16   Deneise Lever, MD  fluticasone (FLONASE) 50 MCG/ACT nasal spray Place 2 sprays into both nostrils daily. 12/27/15   Elsie Stain, MD  guaiFENesin (MUCINEX) 600 MG 12 hr tablet Take 2 tablets (1,200 mg total) by mouth 2 (two) times daily. Patient not taking: Reported on 07/15/2016 05/07/16   Geradine Girt, DO  ipratropium (ATROVENT) 0.02 % nebulizer solution Take 2.5 mLs (0.5 mg total) by nebulization 4 (four) times daily as needed for wheezing or shortness of breath. 06/06/16   Deneise Lever, MD  lisinopril (PRINIVIL,ZESTRIL) 40 MG tablet Take 1 tablet (40 mg total) by mouth daily. 06/25/16   Shanker Kristeen Mans, MD  loratadine (CLARITIN) 10 MG tablet Take 1 tablet (10 mg total) by mouth daily. 08/30/15   [name], MD  metFORMIN (GLUCOPHAGE) 500 MG tablet Take 1 tablet (500 mg total) by mouth 2 (two) times daily with a meal. 05/07/16   Geradine Girt, DO  methocarbamol (ROBAXIN) 500 MG tablet Take 1 tablet (500 mg total) by mouth 2 (two) times daily. 07/15/16   Okey Regal, PA-C  mometasone-formoterol (DULERA) 200-5 MCG/ACT AERO Inhale 2 puffs into the lungs 2 (two) times daily. 06/06/16   Deneise Lever, MD  predniSONE (DELTASONE) 10 MG tablet Take 4 tablets (40 mg) daily for 2 days, then, Take 3 tablets (30 mg) daily for 2 days, then, Take 2 tablets (20 mg) daily for 2 days, then, Take 1 tablets (10 mg) daily for 1 days, then stop Patient not taking: Reported on 07/15/2016 06/25/16   Jonetta Osgood, MD    predniSONE (DELTASONE) 10 MG tablet 4 tabs for 2 days, then 3 tabs for 2 days, 2 tabs for 2 days, then 1 tab for 2 days, then stop 07/15/16   Tammy S Parrett, NP  sertraline (ZOLOFT) 50 MG tablet Take 1 tablet (50 mg total) by mouth daily. Patient not taking: Reported on 07/15/2016 08/30/15   [name], MD  spironolactone (ALDACTONE) 50 MG tablet Take 2 tablets (100 mg total) by mouth daily. 05/21/16   Josalyn Funches, MD  Tiotropium Bromide Monohydrate (SPIRIVA RESPIMAT) 2.5 MCG/ACT AERS Inhale 2 puffs into the lungs daily. 05/07/16   Geradine Girt, DO  traZODone (DESYREL) 50 MG tablet Take 0.5-1 tablets (25-50 mg total) by mouth at bedtime as needed for sleep. 05/21/16   Boykin Nearing, MD    Family History Family History  Problem Relation Age of Onset  .  Hypertension Mother   . Asthma Daughter   . Cancer Paternal Aunt   . Asthma Maternal Grandmother     Social History Social History  Substance Use Topics  . Smoking status: Former Smoker    Packs/day: 0.50    Years: 18.00    Types: Cigarettes    Quit date: 04/29/2014  . Smokeless tobacco: Never Used  . Alcohol use No     Allergies   Patient has no known allergies.   Review of Systems Review of Systems  Genitourinary: Negative for dysuria.  Musculoskeletal: Positive for back pain.  Neurological: Negative for weakness and numbness.     Physical Exam Updated Vital Signs BP (!) 162/106 (BP Location: Left Arm)   Pulse (!) 106   Temp 98.1 F (36.7 C) (Oral)   Resp 18   LMP 06/26/2016   SpO2 99%   Physical Exam  Constitutional: She is oriented to person, place, and time. She appears well-developed and well-nourished. No distress.  HENT:  Head: Normocephalic.  Neck: Normal range of motion. Neck supple.  Pulmonary/Chest: Effort normal.  Musculoskeletal: Normal range of motion. She exhibits tenderness. She exhibits no edema.  No CT or L-spine tenderness.  Minor tenderness palpation of the right lateral thoracic  musculature.  Ribs nontender to palpation, abdomen soft nontender.  Distal sensation strength and motor function intact  Neurological: She is alert and oriented to person, place, and time.  Skin: Skin is warm and dry. She is not diaphoretic.  Psychiatric: She has a normal mood and affect. Her behavior is normal. Judgment and thought content normal.  Nursing note and vitals reviewed.    ED Treatments / Results  DIAGNOSTIC STUDIES: Oxygen Saturation is 99% on RA, normal by my interpretation.    COORDINATION OF CARE: 6:46 PM Discussed treatment plan with pt at bedside and pt agreed to plan.   Labs (all labs ordered are listed, but only abnormal results are displayed) Labs Reviewed - No data to display  EKG  EKG Interpretation None       Radiology No results found.  Procedures Procedures (including critical care time)  Medications Ordered in ED Medications  ketorolac (TORADOL) 30 MG/ML injection 30 mg (not administered)     Initial Impression / Assessment and Plan / ED Course  I have reviewed the triage vital signs and the nursing notes.  Pertinent labs & imaging results that were available during my care of the patient were reviewed by me and considered in my medical decision making (see chart for details).     I personally performed the services described in this documentation, which was scribed in my presence. The recorded information has been reviewed and is accurate.  Final Clinical Impressions(s) / ED Diagnoses   Final diagnoses:  Acute right-sided thoracic back pain    46 year old female presents today with uncomplicated back pain back pain with no red flags.  This is likely muscular in nature as it was acute onset, she has tenderness to palpation of the lateral thoracic musculature.  Patient denies any urinary complaints, denies any abdominal pain I have low suspicion for any intra-abdominal pathology.  Patient has relief with Toradol yesterday, no significant  relief with over-the-counter medications at home.  She is requesting narcotics here, I informed her that this would not be appropriate for uncomplicated back pain.  She is requesting a different muscle relaxer, she will be given this, an injection of Toradol and instructions use Tylenol as needed for pain at home.  She  is given orthopedic follow-up information.  She verbalized understanding and agreement to today's plan had no further questions or concerns  New Prescriptions New Prescriptions   METHOCARBAMOL (ROBAXIN) 500 MG TABLET    Take 1 tablet (500 mg total) by mouth 2 (two) times daily.  I personally performed the services described in this documentation, which was scribed in my presence. The recorded information has been reviewed and is accurate.    Okey Regal, PA-C 07/15/16 [place], MD 07/15/16 [phone]

## 2016-07-15 NOTE — ED Triage Notes (Addendum)
Patient reports she started having pain on Saturday morning when waking up. Pt reports she thinks the pain is related to getting off the couch to the bed wrong on Friday. Pt denies any numbness, tingling, loss of bowel or bladder. Pt was seen at Urgent Care yesterday for same complaint, given medications that have not helped relieve the pain. Denies any urinary symptoms.

## 2016-07-15 NOTE — Assessment & Plan Note (Signed)
Pt has frequent Asthma exacerbation , would avoid ACE inhibitors if possible

## 2016-07-15 NOTE — Progress Notes (Signed)
@Patient  ID: April [name], female    DOB: [birth year], 46 y.o.   MRN: [id number]  Chief Complaint  Patient presents with  . Follow-up    Asthma     Referring provider: Boykin Nearing, MD  HPI: 46 year old female former smoker followed for chronic severe asthma, obstructive sleep apnea  On Xolair   07/15/2016 Follow up : Post hospital follow up  Patient presents for a post hospital follow-up. Patient was recently admitted for an asthma exacerbation. She was treated with IV antibiotics, steroids and nebulized bronchodilators. He was discharged on a prednisone taper. Patient did have elevated blood pressure during hospital stay and was started on lisinopril.. Agent says since discharge, she was starting to do better but since she stopped her prednisone. Wheezing is starting to return. Patient is taking her Dulera twice daily. However, says she has run out of her Spiriva.. She has not restarted her Xolair as of yet. We discussed restarting this.. She does admit on occasion. She still smokes.. Discussed total cessation.   Patient had a sleep study earlier this month, showed mild sleep apnea with AHI at 11/hr. (AHI during REM 60/h)  . We discussed restarting her C Pap machine..  Patient says that she's been having back pain and was seen at urgent care yesterday. Feels that she pulled her back trying to get off the couch a few days ago. She was given medication but says it is not working. She denies any urinary symptoms, fever, or nausea, vomiting.[name] He says she's going back to the urgent care to get x-rays tonight.    No Known Allergies  Immunization History  Administered Date(s) Administered  . Influenza Split 01/08/2012  . Influenza Whole 03/03/2009, 12/27/2009, 12/19/2010  . Influenza,inj,Quad PF,36+ Mos 12/29/2012, 12/31/2013, 03/07/2015, 01/23/2016  . PPD Test 09/25/2011, 07/17/2012, 06/25/2013, 05/21/2016  . Pneumococcal Polysaccharide-23 03/17/2009  . Tdap 12/19/2010    Past  Medical History:  Diagnosis Date  . Acanthosis nigricans   . Arthritis   . Asthma    Followed by Dr. Annamaria Boots (pulmonology); receives every other week omalizumab injections; has frequent exacerbations  . COPD (chronic obstructive pulmonary disease) ([place])    PFTs in 2002, FEV1/FVC 65, no post bronchodilater test done  . Depression   . GERD (gastroesophageal reflux disease)   . Headache(784.0)   . Helicobacter pylori (H. pylori) infection   . Hypertension, essential   . Insomnia   . Menorrhagia   . Morbid obesity ([place])   . Obesity   . Seasonal allergies   . Shortness of breath   . Sleep apnea    Sleep study 2008 - mild OSA, not enough events to titrate CPAP  . Tobacco user     Tobacco History: History  Smoking Status  . Former Smoker  . Packs/day: 0.50  . Years: 18.00  . Types: Cigarettes  . Quit date: 04/29/2014  Smokeless Tobacco  . Never Used   Counseling given: Not Answered   Outpatient Encounter Prescriptions as of 07/15/2016  Medication Sig  . acetaminophen (TYLENOL) 500 MG tablet Take 2 tablets (1,000 mg total) by mouth every 6 (six) hours as needed.  [name] albuterol (PROAIR HFA) 108 (90 Base) MCG/ACT inhaler Inhale 1-2 puffs into the lungs every 6 (six) hours as needed for wheezing or shortness of breath.  [name] albuterol (PROVENTIL) (2.5 MG/3ML) 0.083% nebulizer solution Take 3 mLs (2.5 mg total) by nebulization every 4 (four) hours as needed for wheezing or shortness of breath.  [name] amLODipine (NORVASC) 10  MG tablet Take 1 tablet (10 mg total) by mouth daily.  . benzonatate (TESSALON) 100 MG capsule Take 1 capsule (100 mg total) by mouth 3 (three) times daily as needed for cough.  . diclofenac (VOLTAREN) 75 MG EC tablet Take 1 tablet (75 mg total) by mouth 2 (two) times daily.  [name] esomeprazole (NEXIUM) 40 MG capsule Take 1 capsule (40 mg total) by mouth daily.  . fluticasone (FLONASE) 50 MCG/ACT nasal spray Place 2 sprays into both nostrils daily.  [name] ipratropium (ATROVENT) 0.02  % nebulizer solution Take 2.5 mLs (0.5 mg total) by nebulization 4 (four) times daily as needed for wheezing or shortness of breath.  . lisinopril (PRINIVIL,ZESTRIL) 40 MG tablet Take 1 tablet (40 mg total) by mouth daily.  [name] loratadine (CLARITIN) 10 MG tablet Take 1 tablet (10 mg total) by mouth daily.  . metFORMIN (GLUCOPHAGE) 500 MG tablet Take 1 tablet (500 mg total) by mouth 2 (two) times daily with a meal.  . mometasone-formoterol (DULERA) 200-5 MCG/ACT AERO Inhale 2 puffs into the lungs 2 (two) times daily.  [name] spironolactone (ALDACTONE) 50 MG tablet Take 2 tablets (100 mg total) by mouth daily.  . Tiotropium Bromide Monohydrate (SPIRIVA RESPIMAT) 2.5 MCG/ACT AERS Inhale 2 puffs into the lungs daily.  . traZODone (DESYREL) 50 MG tablet Take 0.5-1 tablets (25-50 mg total) by mouth at bedtime as needed for sleep.  . cyclobenzaprine (FLEXERIL) 10 MG tablet Take 1 tablet (10 mg total) by mouth 2 (two) times daily as needed for muscle spasms. (Patient not taking: Reported on 07/15/2016)  . guaiFENesin (MUCINEX) 600 MG 12 hr tablet Take 2 tablets (1,200 mg total) by mouth 2 (two) times daily. (Patient not taking: Reported on 07/15/2016)  . predniSONE (DELTASONE) 10 MG tablet Take 4 tablets (40 mg) daily for 2 days, then, Take 3 tablets (30 mg) daily for 2 days, then, Take 2 tablets (20 mg) daily for 2 days, then, Take 1 tablets (10 mg) daily for 1 days, then stop (Patient not taking: Reported on 07/15/2016)  . predniSONE (DELTASONE) 10 MG tablet 4 tabs for 2 days, then 3 tabs for 2 days, 2 tabs for 2 days, then 1 tab for 2 days, then stop  . sertraline (ZOLOFT) 50 MG tablet Take 1 tablet (50 mg total) by mouth daily. (Patient not taking: Reported on 07/15/2016)  . [DISCONTINUED] azithromycin (ZITHROMAX) 500 MG tablet Take 1 tablet (500 mg total) by mouth daily. (Patient not taking: Reported on 07/15/2016)  . [DISCONTINUED] nicotine (NICODERM CQ - DOSED IN MG/24 HOURS) 14 mg/24hr patch Place 14 mg onto  the skin daily as needed (SMOKING). Wednesdays and Sundays   Facility-Administered Encounter Medications as of 07/15/2016  Medication  . omalizumab Arvid Right) injection 225 mg  . omalizumab Arvid Right) injection 225 mg  . omalizumab Arvid Right) injection 225 mg     Review of Systems  Constitutional:   No  weight loss, night sweats,  Fevers, chills, fatigue, or  lassitude.  HEENT:   No headaches,  Difficulty swallowing,  Tooth/dental problems, or  Sore throat,                No sneezing, itching, ear ache,  +nasal congestion, post nasal drip,   CV:  No chest pain,  Orthopnea, PND, swelling in lower extremities, anasarca, dizziness, palpitations, syncope.   GI  No heartburn, indigestion, abdominal pain, nausea, vomiting, diarrhea, change in bowel habits, loss of appetite, bloody stools.   Resp:    No chest wall deformity  Skin: no rash or lesions.  GU: no dysuria, change in color of urine, no urgency or frequency.  No flank pain, no hematuria   MS:  No joint pain or swelling.  No decreased range of motion.  No back pain.    Physical Exam  BP 136/82 (BP Location: Left Arm, Cuff Size: Normal)   Pulse 95   Ht 5\' 6"  (1.676 m)   Wt (!) 341 lb 12.8 oz (155 kg)   LMP 06/26/2016   SpO2 97%   BMI 55.17 kg/m   GEN: A/Ox3; pleasant , NAD, obese    HEENT:  [place]/AT,  EACs-clear, TMs-wnl, NOSE-clear, THROAT-clear, no lesions, no postnasal drip or exudate noted. Class 2-3 MP airway   NECK:  Supple w/ fair ROM; no JVD; normal carotid impulses w/o bruits; no thyromegaly or nodules palpated; no lymphadenopathy.    RESP  Trace exp wheeze,  no accessory muscle use, no dullness to percussion  CARD:  RRR, no m/r/g, no peripheral edema, pulses intact, no cyanosis or clubbing.  GI:   Soft & nt; nml bowel sounds; no organomegaly or masses detected.   Musco: Warm bil, no deformities or joint swelling noted.   Neuro: alert, no focal deficits noted.    Skin: Warm, no lesions or rashes    Lab  Results: Imaging: Dg Chest Portable 1 View  Result Date: 06/21/2016 CLINICAL DATA:  Shortness of breath . EXAM: PORTABLE CHEST 1 VIEW COMPARISON:  05/09/2016. FINDINGS: Cardiomegaly with mild bilateral interstitial prominence suggesting mild CHF. Small bilateral pleural effusions cannot be excluded. No pneumothorax . IMPRESSION: Cardiomegaly with very mild interstitial prominence suggesting mild interstitial edema. Small bilateral pleural effusions cannot be excluded . Electronically Signed   By: Marcello Moores  Register   On: 06/21/2016 12:33     Assessment & Plan:   Mild obstructive sleep apnea Restart CPAP At bedtime   Plan  CPAP At bedtime   Wt loss   Morbid obesity with body mass index of 50.0-59.9 in adult ([place]) Wt loss   Essential hypertension Pt has frequent Asthma exacerbation , would avoid ACE inhibitors if possible   Asthma exacerbation Recurrent exacerbations w/ frequent admissions  Try to get back on Xolair and Spriiva  Short steroid taper, pt education on steroids   Plan  Patient Instructions  Discuss with Primary MD that lisinopril can cause your cough to be worse.  Restart Xolair .  Continue on Dulera 2 puffs Twice daily   Restart Spiriva daily .  Prednisone taper over next week.  Mucinex DM Twice daily  As needed  Cough/congestion  Follow up with Dr. Annamaria Boots  In 6- 8 weeks and As needed   Please contact office for sooner follow up if symptoms do not improve or worsen or seek emergency care         Rexene Edison, NP 07/15/2016

## 2016-07-15 NOTE — Assessment & Plan Note (Signed)
Wt loss  

## 2016-07-27 IMAGING — CR DG CHEST 2V
2 series · 2 of 2 positions shown · non-contrast
Comparison: May 25, 2015

CLINICAL DATA: Shortness of breath with cough and wheezing for 1
day

EXAM:
CHEST  2 VIEW

[w chest pa]
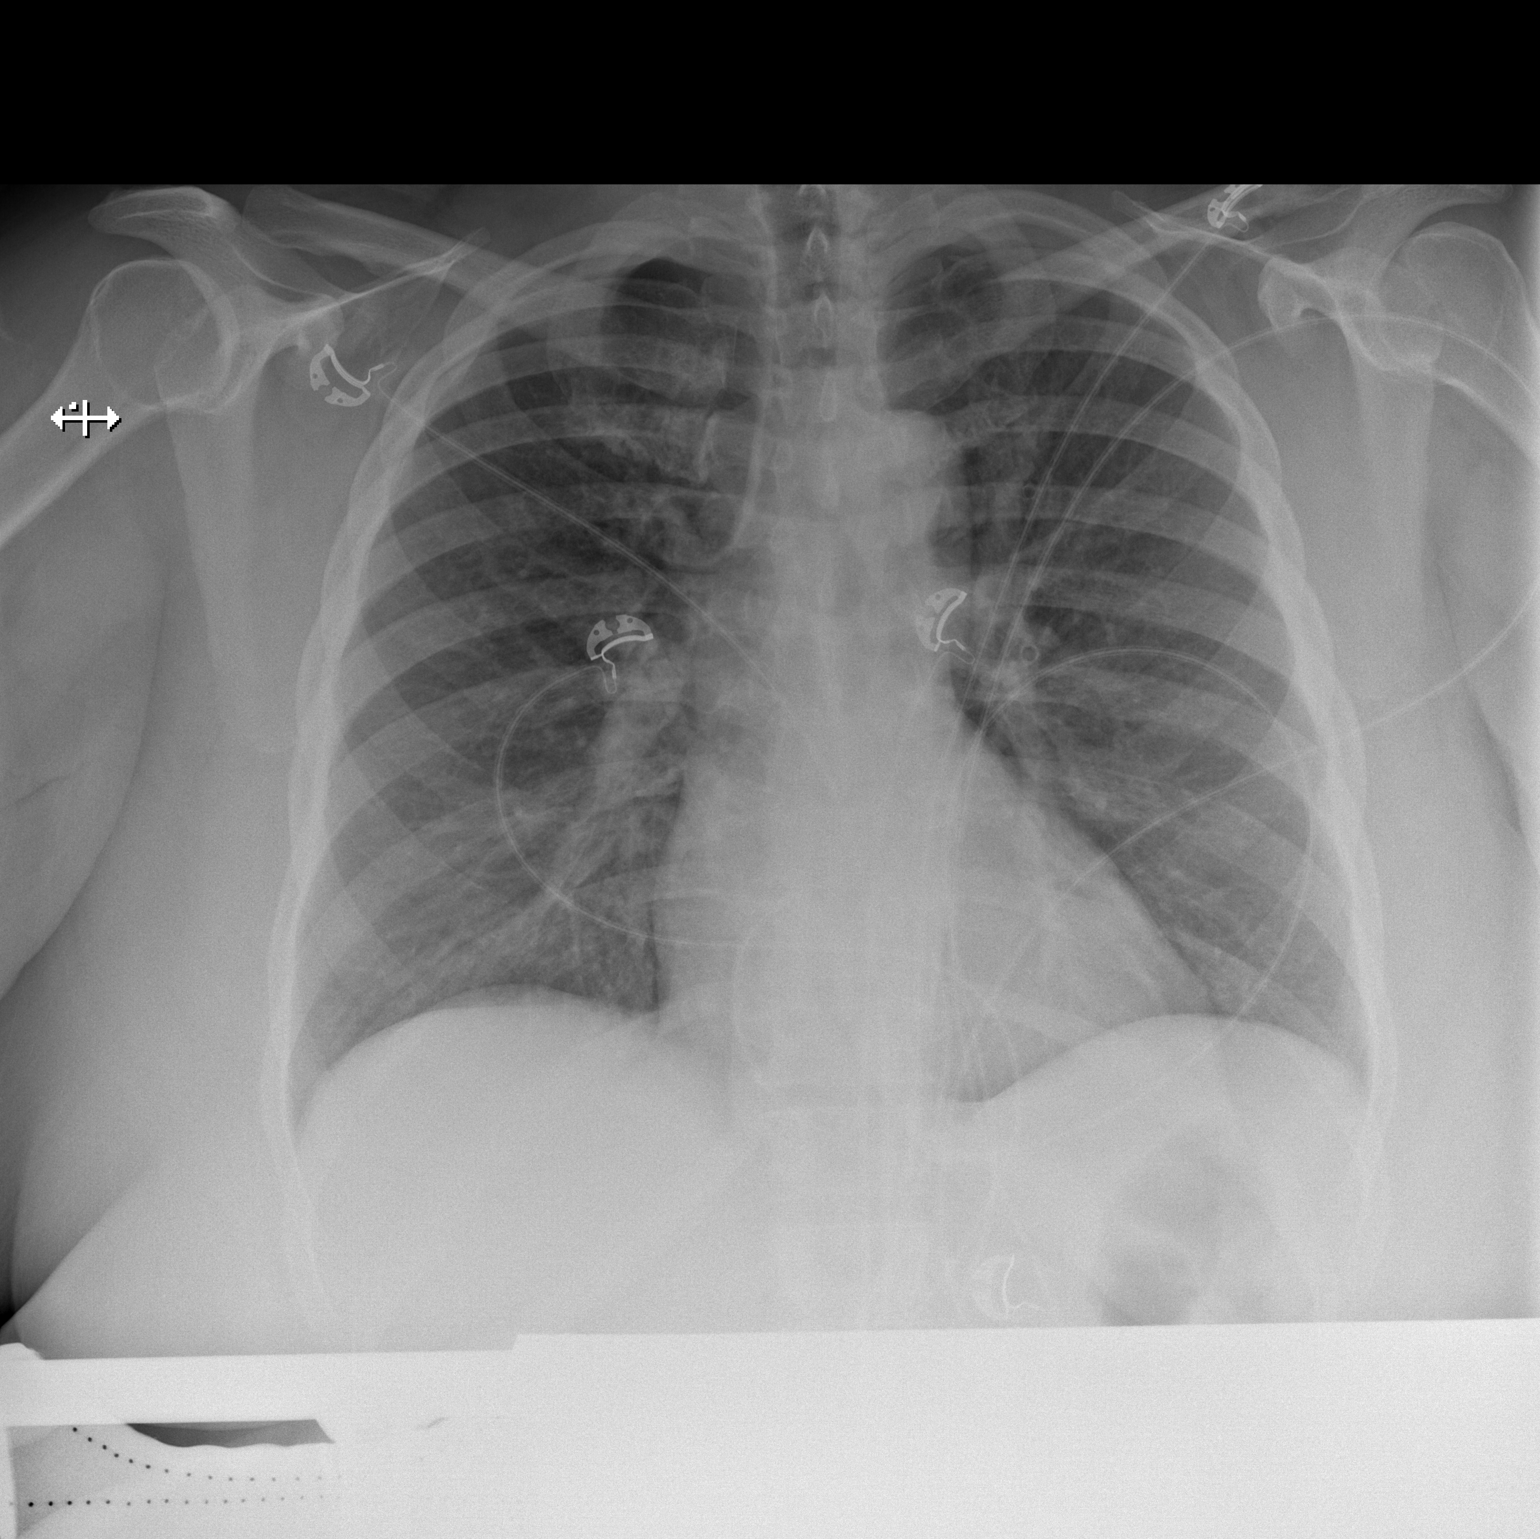

[w chest lat]
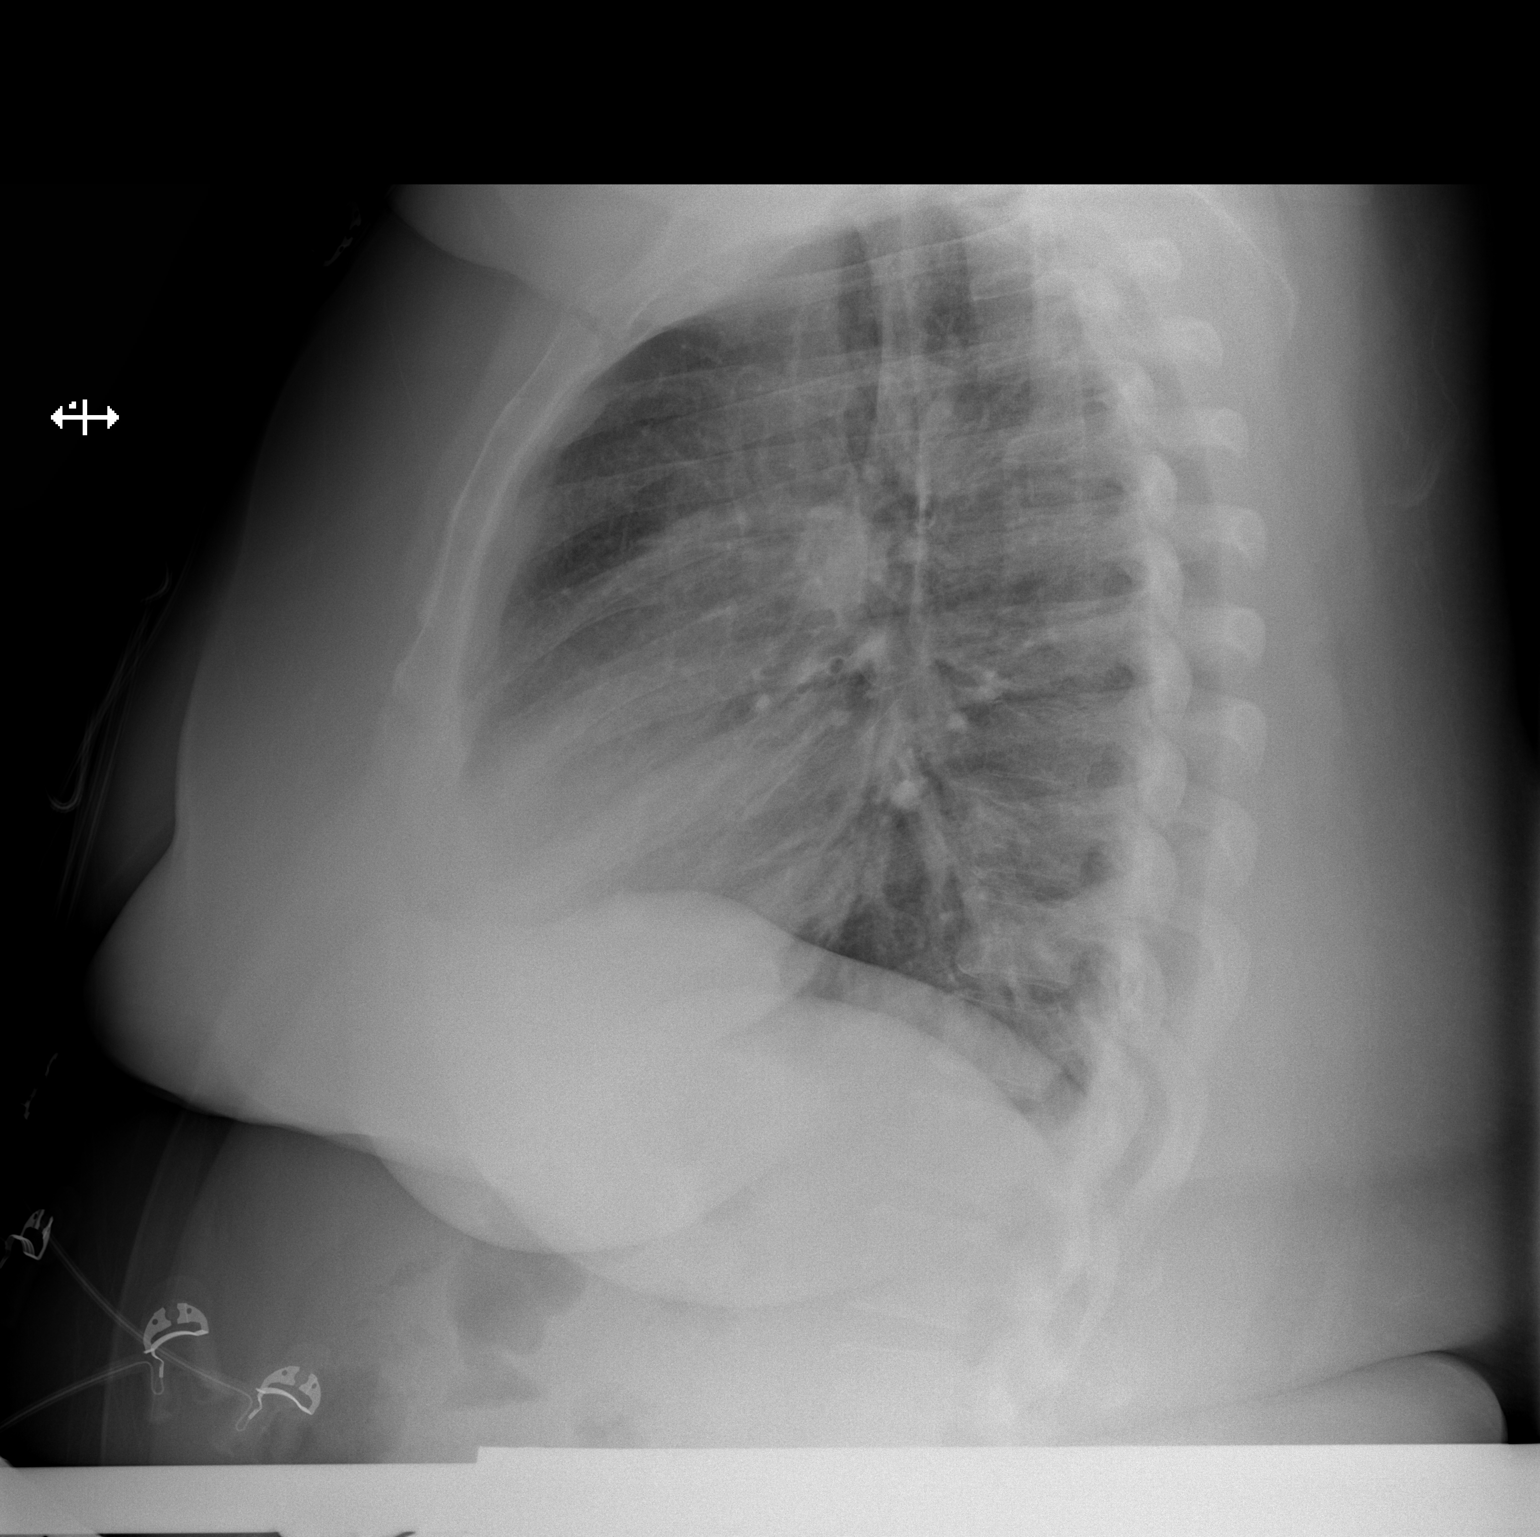

[2 of 2 positions shown; findings below may reference images not displayed]

FINDINGS: Lungs are clear. Heart size and pulmonary vascularity are normal. No
adenopathy. No bone lesions.
IMPRESSION: No edema or consolidation.

## 2016-07-29 ENCOUNTER — Encounter: Payer: Self-pay | Admitting: Internal Medicine

## 2016-07-29 ENCOUNTER — Ambulatory Visit (INDEPENDENT_AMBULATORY_CARE_PROVIDER_SITE_OTHER): Payer: Medicaid Other | Admitting: Internal Medicine

## 2016-07-29 VITALS — BP 120/78 | HR 106 | Ht 66.0 in | Wt 327.6 lb

## 2016-07-29 DIAGNOSIS — G4733 Obstructive sleep apnea (adult) (pediatric): Secondary | ICD-10-CM

## 2016-07-29 DIAGNOSIS — J4540 Moderate persistent asthma, uncomplicated: Secondary | ICD-10-CM

## 2016-07-29 DIAGNOSIS — E039 Hypothyroidism, unspecified: Secondary | ICD-10-CM

## 2016-07-29 DIAGNOSIS — Z6841 Body Mass Index (BMI) 40.0 and over, adult: Secondary | ICD-10-CM

## 2016-07-29 DIAGNOSIS — J449 Chronic obstructive pulmonary disease, unspecified: Secondary | ICD-10-CM

## 2016-07-29 MED ORDER — PREDNISONE 10 MG PO TABS: ORAL_TABLET | ORAL | 5 refills | Status: DC

## 2016-07-29 MED ORDER — ALBUTEROL SULFATE HFA 108 (90 BASE) MCG/ACT IN AERS: 1.0000 | INHALATION_SPRAY | Freq: Four times a day (QID) | RESPIRATORY_TRACT | 12 refills | Status: DC | PRN

## 2016-07-29 NOTE — Progress Notes (Signed)
Patient ID: April [name], female    DOB: 12-15-1970, 46 y.o.   MRN: [id number]  04/08/13- 64 yoF former smoker followed for chronic severe asthma/ Xolair complicated by OSA/ CPAP,  morbid obesity, HBP, depression, GERD FOLLOWS FOR: still on Xolair; continues to wheeze alot. Feels like since taking clonazepam she wakes up with the feeling of not being able to breath. She describes waking wheezing and short of breath after taking clonazepam. It sounds as if she just sleeps deeply. Ambien caused hallucination when she was taking 2 or 3 at a time. She says she was "given" her CPAP machine at the hospital but with no followup instructions and no explanation as to how to use it.  NPSG 03/31/07- AHI 5.8/ hr, weight 330 lbs. She canceled planned more recent study. Office spirometry 10/24/14- severe obstructive airways disease. FVC 2.10/66%, FEV1 1.25/47%, FEV1/FVC 0.59, FEF 25-75 percent 0.64/19% Unattended HST 07/06/16- AHI 11/hour, desaturation to 74%. Office Spirometry 07/29/2016-moderately severe obstructive airways disease. FVC 2.21/68%, FEV1 1.53/57%, ratio 0.69, FEF 25-75% 0.98/34% --------------------------------------------------------  08/17/2015-46 year old female former smoker followed for chronic severe asthma/Xolair complicated by noncompliance, OSA/CPAP, morbid obesity, HBP, depression, GERD Post hospital; pt feels like she was released from hospital too soon. Increased wheezing and SOB-continues Xolair injections as well. Denies smoking. Repeated hospitalizations for exacerbations of asthmatic bronchitis. Blames pollen now for making her "sick". She thinks she was discharged from hospital too quickly last time. Actively wheezing despite steroid taper. Thinks that Xolair helps but we recognize compliance has been intermittent.  07/29/2016-46 year old female former smoker followed for chronic severe asthma/Xolair, complicated by noncompliance, OSA/CPAP, morbid obesity, HBP, depression,  GERD Unattended HST 07/06/16- AHI 11/hour, desaturation to 74% Here with female companion today who tells her she is not snoring much. She is willing to try CPAP again but says she only tried it briefly before when pressure was too high. There was never enough compliance to allow for adjustment.  She complains of difficulty initiating and maintaining sleep. We had some samples to offer and discussed trial of REM-Fresh melatonin. Last flare of asthma was in February and she feels well today. It helped her to have prednisone one or 25 mg tablets to use at home for interrupting exacerbations before needing emergency room. Not using Spiriva. Nebulizer with ipratropium/albuterol used once or twice daily when needed. Dulera 200. Last Xolair injection 1 month ago. She is vague about compliance and tends to use medications only when she feels asthma flaring. Education done again. CXR 06/21/2016 IMPRESSION: Cardiomegaly with very mild interstitial prominence suggesting mild interstitial edema. Small bilateral pleural effusions cannot be excluded . Office Spirometry 07/29/2016-moderately severe obstructive airways disease. FVC 2.21/68%, FEV1 1.53/57%, ratio 0.69, FEF 25-75% 0.98/34%  Review of Systems-See HPI Constitutional:   No-   weight loss, night sweats, fevers, chills, +fatigue, lassitude. HEENT:   No-  headaches, difficulty swallowing, tooth/dental problems, sore throat,    sneezing, itching, ear ache, +nasal congestion, post nasal drip,  CV:  No-   chest pain, orthopnea, PND, swelling in lower extremities, anasarca, dizziness, palpitations Resp: +shortness of breath with exertion or at rest.              productive cough,  + non-productive cough,  No- coughing up of blood.              No-   change in color of mucus. +wheezing.   Skin: No-   rash or lesions. GI:  No-   heartburn, indigestion, abdominal pain, nausea, vomiting,  GU: MS:  No-   joint pain or swelling.   Neuro-     nothing  unusual Psych:  No- change in mood or affect. No depression or anxiety.  No memory loss.    Objective:   Physical Exam General- Alert, Oriented, Affect-appropriate, Distress- none acute, + morbid  obesity,  Skin- rash-none, lesions- none, excoriation- none Lymphadenopathy- none Head- atraumatic            Eyes- Gross vision intact, PERRLA, conjunctivae clear secretions            Ears- Hearing, canals-normal            Nose- Clear, no-Septal dev, mucus, polyps, erosion, perforation             Throat- Mallampati II , mucosa clear , drainage- none, tonsils- atrophic,   + Missing teeth, upper denture plate                               Neck- flexible , trachea midline, no stridor , thyroid nl, carotid no bruit Chest - symmetrical excursion , unlabored           Heart/CV- RRR , no murmur , no gallop  , no rub, nl s1 s2                           - JVD- none , edema- none, stasis changes- none, varices- none           Lung- +Diminished/ clear now, unlabored.  Wheeze -None, cough-none,                                                 dullness-none,  rub- none           Chest wall-  Abd-  Br/ Gen/ Rectal- Not done, not indicated Extrem- cyanosis- none, clubbing, none, atrophy- none, strength- nl Neuro- grossly intact to observation

## 2016-07-29 NOTE — Patient Instructions (Addendum)
Order- DC pending cpap titration sleep study  Order- New DME, new CPAP auto 5-20, mask of choice, humidifier, supplies, AirView    Dx OSSA  Ok referral to her specific bariatric surgery program. We need to get the correct contact information from her- [facility] office on [place] rescue inhaler to use 2 puffs every 4-6 hours, if needed  Script sent for prednisone 10 mg tabs to hold for use 1-3 daily, only if needed to keep asthma from getting out of control, so you can avoid the ER.  Order office spirometry      Dx Asthma moderate persistent  Samples REM-Fresh melatonin- one each night at bedtime

## 2016-07-30 NOTE — Assessment & Plan Note (Signed)
She has made contact with a bariatric program associated with [facility] in downtown [place]. She says she needs [place] to send them information in support of this surgery and I would strongly be in favor of getting it done. She will get [place] the contact address.

## 2016-07-30 NOTE — Assessment & Plan Note (Addendum)
We can auto titrate new CPAP for her. She will need extra attention to comfort, compliance and tracking Plan-new CPAP auto 5-20

## 2016-07-30 NOTE — Assessment & Plan Note (Addendum)
Our first priority is to stabilize her enough that we can stop the frequent trips to the emergency room. Despite the side effect issues which she understands, I think her best strategy has been to have access to prednisone at home. She can take in light doses early, to have off attacks. She was run out of medications at least partly due to finances. Plan prednisone maintenance to have available. Continue Xolair, Ruthe Mannan, home nebulizer.

## 2016-08-02 ENCOUNTER — Telehealth: Payer: Self-pay | Admitting: Internal Medicine

## 2016-08-02 ENCOUNTER — Encounter: Payer: Self-pay | Admitting: Internal Medicine

## 2016-08-02 NOTE — Telephone Encounter (Signed)
Letter sent.

## 2016-08-02 NOTE — Telephone Encounter (Signed)
Will forward to [facility] for letter to send to [facility].

## 2016-08-02 NOTE — Telephone Encounter (Signed)
Pt calling with a fax # for [facility] referral- states that Joellen Jersey was expecting this info from her.   Fax: [phone]  Phone: [phone]  I advised pt that I'd forward this to KW and she would call back if she had any further questions or needed any further info.  KW please advise.  Thanks!

## 2016-08-05 ENCOUNTER — Ambulatory Visit (INDEPENDENT_AMBULATORY_CARE_PROVIDER_SITE_OTHER): Payer: Medicaid Other | Admitting: Orthopaedic Surgery

## 2016-08-05 ENCOUNTER — Ambulatory Visit (INDEPENDENT_AMBULATORY_CARE_PROVIDER_SITE_OTHER): Payer: Self-pay

## 2016-08-05 ENCOUNTER — Ambulatory Visit (INDEPENDENT_AMBULATORY_CARE_PROVIDER_SITE_OTHER): Payer: Medicaid Other

## 2016-08-05 DIAGNOSIS — M1711 Unilateral primary osteoarthritis, right knee: Secondary | ICD-10-CM

## 2016-08-05 DIAGNOSIS — M25562 Pain in left knee: Secondary | ICD-10-CM | POA: Diagnosis not present

## 2016-08-05 DIAGNOSIS — M1712 Unilateral primary osteoarthritis, left knee: Secondary | ICD-10-CM

## 2016-08-05 DIAGNOSIS — M25561 Pain in right knee: Secondary | ICD-10-CM | POA: Diagnosis not present

## 2016-08-05 DIAGNOSIS — G8929 Other chronic pain: Secondary | ICD-10-CM

## 2016-08-05 MED ORDER — METHYLPREDNISOLONE ACETATE 40 MG/ML IJ SUSP
40.0000 mg | INTRAMUSCULAR | Status: AC | PRN
  Administered 2016-08-05: 40 mg via INTRA_ARTICULAR

## 2016-08-05 MED ORDER — BUPIVACAINE HCL 0.5 % IJ SOLN
2.0000 mL | INTRAMUSCULAR | Status: AC | PRN
  Administered 2016-08-05: 2 mL via INTRA_ARTICULAR

## 2016-08-05 MED ORDER — LIDOCAINE HCL 1 % IJ SOLN
2.0000 mL | INTRAMUSCULAR | Status: AC | PRN
  Administered 2016-08-05: 2 mL

## 2016-08-05 MED ORDER — DICLOFENAC SODIUM 75 MG PO TBEC: 75.0000 mg | DELAYED_RELEASE_TABLET | Freq: Two times a day (BID) | ORAL | 2 refills | Status: DC

## 2016-08-05 NOTE — Progress Notes (Signed)
Office Visit Note   Patient: April [name]           Date of Birth: July 02, 1970           MRN: [id number] Visit Date: 08/05/2016              Requested by: Boykin Nearing, MD [address] [place], [place] [address] PCP: [name], MD   Assessment & Plan: Visit Diagnoses:  1. Chronic pain of both knees     Plan: Bilateral knee injection was performed. Patient has advanced degenerative joint disease and is morbidly obese with BMI of greater than 50. Patient's insurance will not pay for Visco supplementation injections. I urged her to lose weight to become a surgical candidate eventually. Questions encouraged and answered. I'll see her back as needed.  Follow-Up Instructions: Return if symptoms worsen or fail to improve.   Orders:  Orders Placed This Encounter  Procedures  . Large Joint Injection/Arthrocentesis  . Large Joint Injection/Arthrocentesis  . XR KNEE 3 VIEW LEFT  . XR KNEE 3 VIEW RIGHT   Meds ordered this encounter  Medications  . diclofenac (VOLTAREN) 75 MG EC tablet    Sig: Take 1 tablet (75 mg total) by mouth 2 (two) times daily.    Dispense:  30 tablet    Refill:  2      Procedures: Large Joint Inj Date/Time: 08/05/2016 2:53 PM Performed by: Leandrew Koyanagi Authorized by: Leandrew Koyanagi   Consent Given by:  Patient Timeout: prior to procedure the correct patient, procedure, and site was verified   Indications:  Pain Location:  Knee Site:  R knee Prep: patient was prepped and draped in usual sterile fashion   Needle Size:  22 G Ultrasound Guidance: No   Fluoroscopic Guidance: No   Arthrogram: No   Patient tolerance:  Patient tolerated the procedure well with no immediate complications Large Joint Inj Date/Time: 08/05/2016 2:53 PM Performed by: Leandrew Koyanagi Authorized by: Leandrew Koyanagi   Consent Given by:  Patient Timeout: prior to procedure the correct patient, procedure, and site was verified   Indications:  Pain Location:  Knee Site:  R  knee Prep: patient was prepped and draped in usual sterile fashion   Needle Size:  22 G Ultrasound Guidance: No   Fluoroscopic Guidance: No   Arthrogram: No   Medications:  2 mL lidocaine 1 %; 2 mL bupivacaine 0.5 %; 40 mg methylPREDNISolone acetate 40 MG/ML Patient tolerance:  Patient tolerated the procedure well with no immediate complications     Clinical Data: No additional findings.   Subjective: Chief Complaint  Patient presents with  . Right Knee - Pain  . Left Knee - Pain    Patient is a 46 year old female comes in with bilateral knee pain of chronic nature worse on the left knee where she's had previous injections with partial relief. She has taken over-the-counter NSAIDs and Modic and tramadol without significant relief. She states the pain hurts all over her knee and does not radiate. There is cracking and popping. Worse with activity better with rest.    Review of Systems  Constitutional: Negative.   HENT: Negative.   Eyes: Negative.   Respiratory: Negative.   Cardiovascular: Negative.   Endocrine: Negative.   Musculoskeletal: Negative.   Neurological: Negative.   Hematological: Negative.   Psychiatric/Behavioral: Negative.   All other systems reviewed and are negative.    Objective: Vital Signs: There were no vitals taken for this  visit.  Physical Exam  Constitutional: She is oriented to person, place, and time. She appears well-developed and well-nourished.  HENT:  Head: Normocephalic and atraumatic.  Eyes: EOM are normal.  Neck: Neck supple.  Pulmonary/Chest: Effort normal.  Abdominal: Soft.  Neurological: She is alert and oriented to person, place, and time.  Skin: Skin is warm. Capillary refill takes less than 2 seconds.  Psychiatric: She has a normal mood and affect. Her behavior is normal. Judgment and thought content normal.  Nursing note and vitals reviewed.   Ortho Exam Bilateral knee exam shows no joint effusion as best as I can  ascertain. She does have a large soft tissue envelope around her knee which does make the exam more difficult. Collaterals and cruciates are grossly intact. Normal range of motion. Positive patellar crepitus. Specialty Comments:  No specialty comments available.  Imaging: No results found.   PMFS History: Patient Active Problem List   Diagnosis Date Noted  . Hypertensive urgency, malignant 06/22/2016  . Pulmonary edema 06/22/2016  . Hypertensive urgency 06/22/2016  . 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 Lip laceration 05/05/2016  . Elevated hemoglobin A1c 05/05/2016  . Asthma exacerbation 11/10/2015  . GERD (gastroesophageal reflux disease) 08/30/2015  . Generalized anxiety disorder 08/30/2015  . Seasonal allergic rhinitis 08/29/2013  . Tobacco abuse 10/07/2012  . Asthma, chronic obstructive, without status asthmaticus ([place]) 05/07/2012  . Knee pain, bilateral 04/25/2011  . Primary insomnia 03/14/2011  . Mild obstructive sleep apnea 12/19/2010  . Hypokalemia 08/13/2010  . Cervical back pain with evidence of disc disease 04/08/2008  . Essential hypertension 07/31/2006  . Morbid obesity with body mass index of 50.0-59.9 in adult ([place]) 06/17/2006  . Major depressive disorder, recurrent episode ([place]) 04/10/2006   Past Medical History:  Diagnosis Date  . Acanthosis nigricans   . Arthritis   . Asthma    Followed by Dr. Annamaria Boots (pulmonology); receives every other week omalizumab injections; has frequent exacerbations  . COPD (chronic obstructive pulmonary disease) ([place])    PFTs in 2002, FEV1/FVC 65, no post bronchodilater test done  . Depression   . GERD (gastroesophageal reflux disease)   . Headache(784.0)   . Helicobacter pylori (H. pylori) infection   . Hypertension, essential   . Insomnia   . Menorrhagia   . Morbid obesity ([place])   . Obesity   . Seasonal allergies   . Shortness of breath   . Sleep apnea    Sleep study 2008 - mild OSA,  not enough events to titrate CPAP  . Tobacco user     Family History  Problem Relation Age of Onset  . Hypertension Mother   . Asthma Daughter   . Cancer Paternal Aunt   . Asthma Maternal Grandmother     Past Surgical History:  Procedure Laterality Date  . BREAST REDUCTION SURGERY  09/2011  . TUBAL LIGATION  1996   bilateral   Social History   Occupational History  . CNA    Social History Main Topics  . Smoking status: Former Smoker    Packs/day: 0.50    Years: 18.00    Types: Cigarettes    Quit date: 04/29/2014  . Smokeless tobacco: Never Used  . Alcohol use No  . Drug use: No  . Sexual activity: Not on file

## 2016-08-05 NOTE — Telephone Encounter (Signed)
Patient states she called April [name] today and the letter was not received...contact # [phone].Marland KitchenMearl Latin

## 2016-08-05 NOTE — Telephone Encounter (Signed)
Spoke with Apolonio Schneiders at [employer], who states letter has not been received. I have refaxed letter to [phone]. Received fax confirmation. lmtcb x1 for pt to make pt aware.

## 2016-08-06 NOTE — Telephone Encounter (Signed)
Spoke with pt and made her aware that the letter was re-faxed. She had no further questions. Nothing further is needed

## 2016-08-15 ENCOUNTER — Ambulatory Visit: Payer: Medicaid Other | Admitting: Family Medicine

## 2016-08-16 ENCOUNTER — Other Ambulatory Visit: Payer: Self-pay | Admitting: Internal Medicine

## 2016-08-29 IMAGING — DX DG CHEST 2V
2 series · 2 of 2 positions shown · non-contrast
Comparison: 07/12/2015

CLINICAL DATA: Wheezing, cough, shortness of breath

EXAM:
CHEST  2 VIEW

[chest pa]
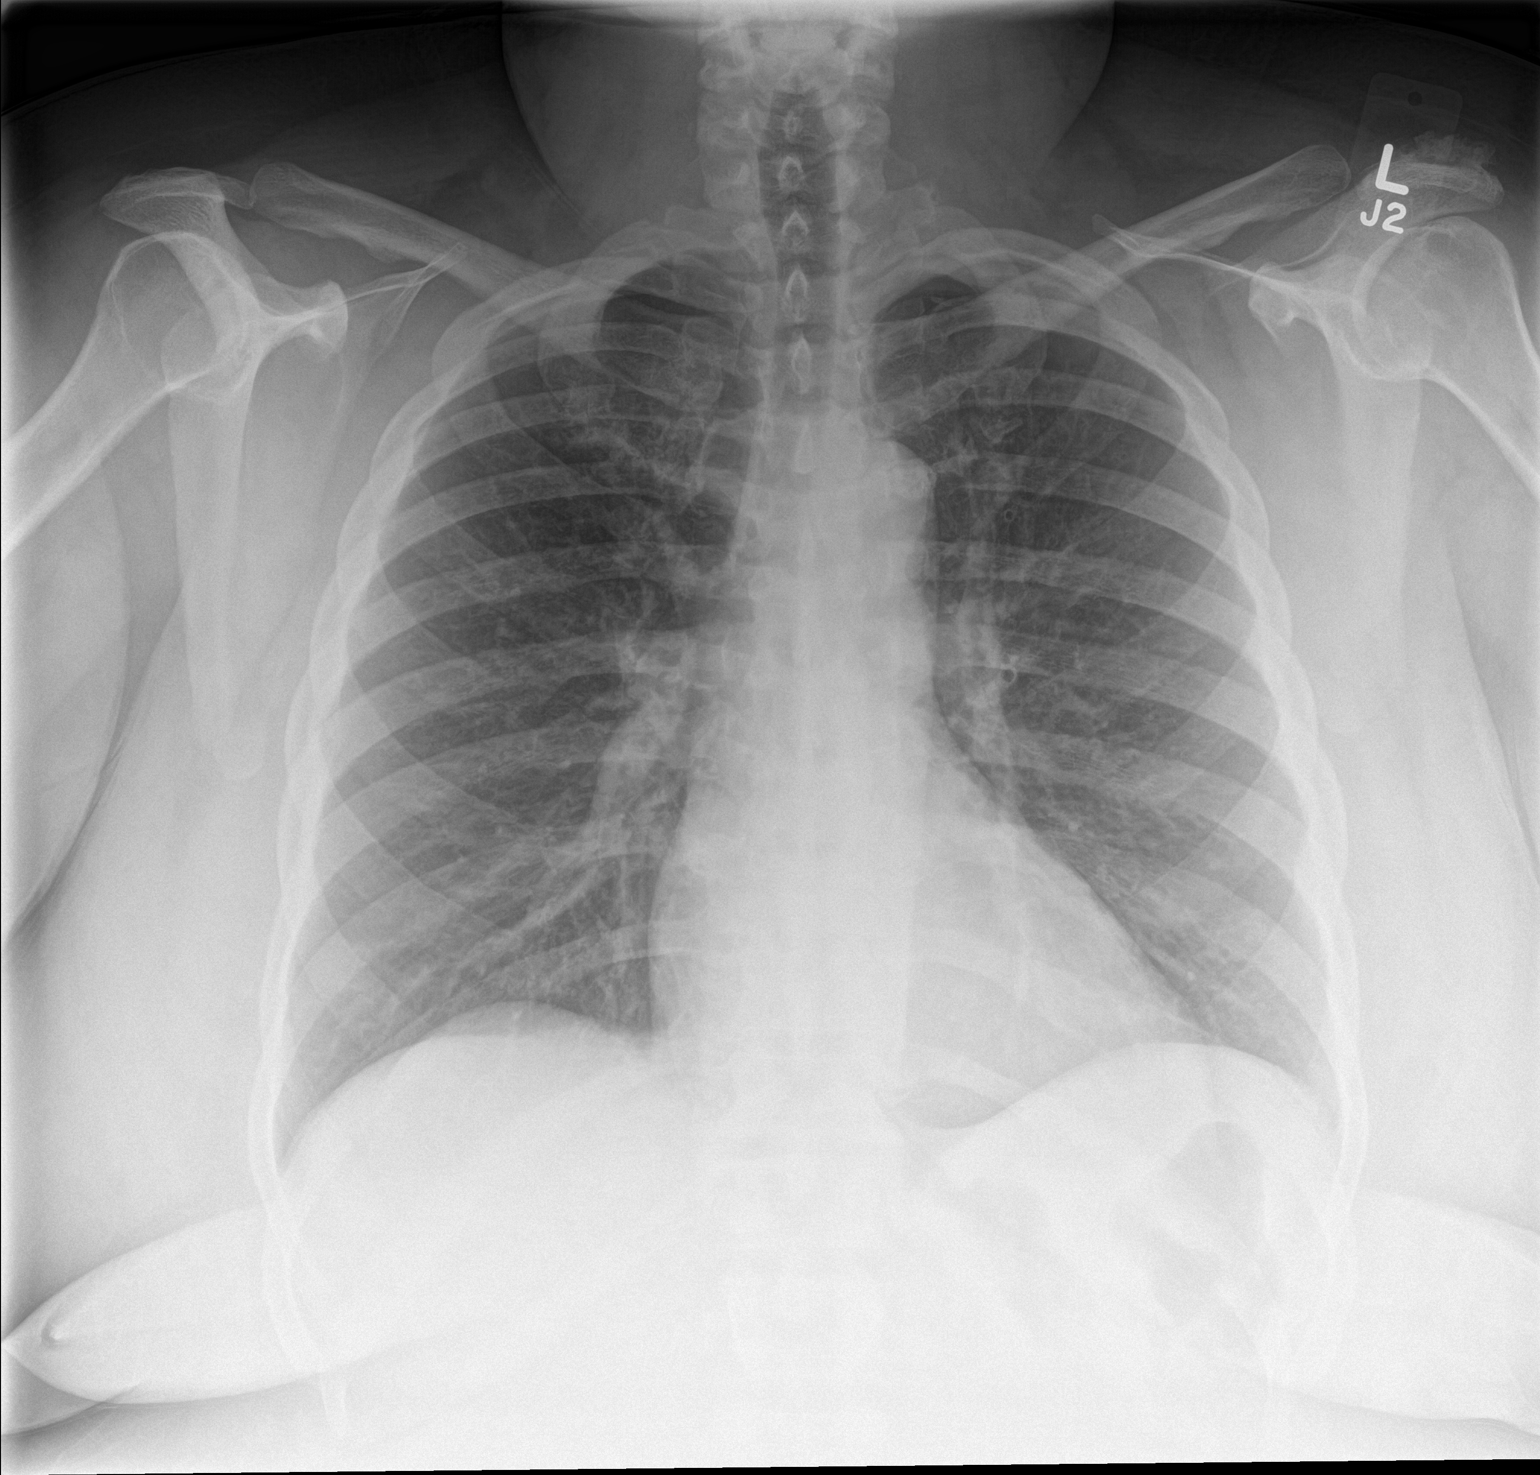

[chest lat]
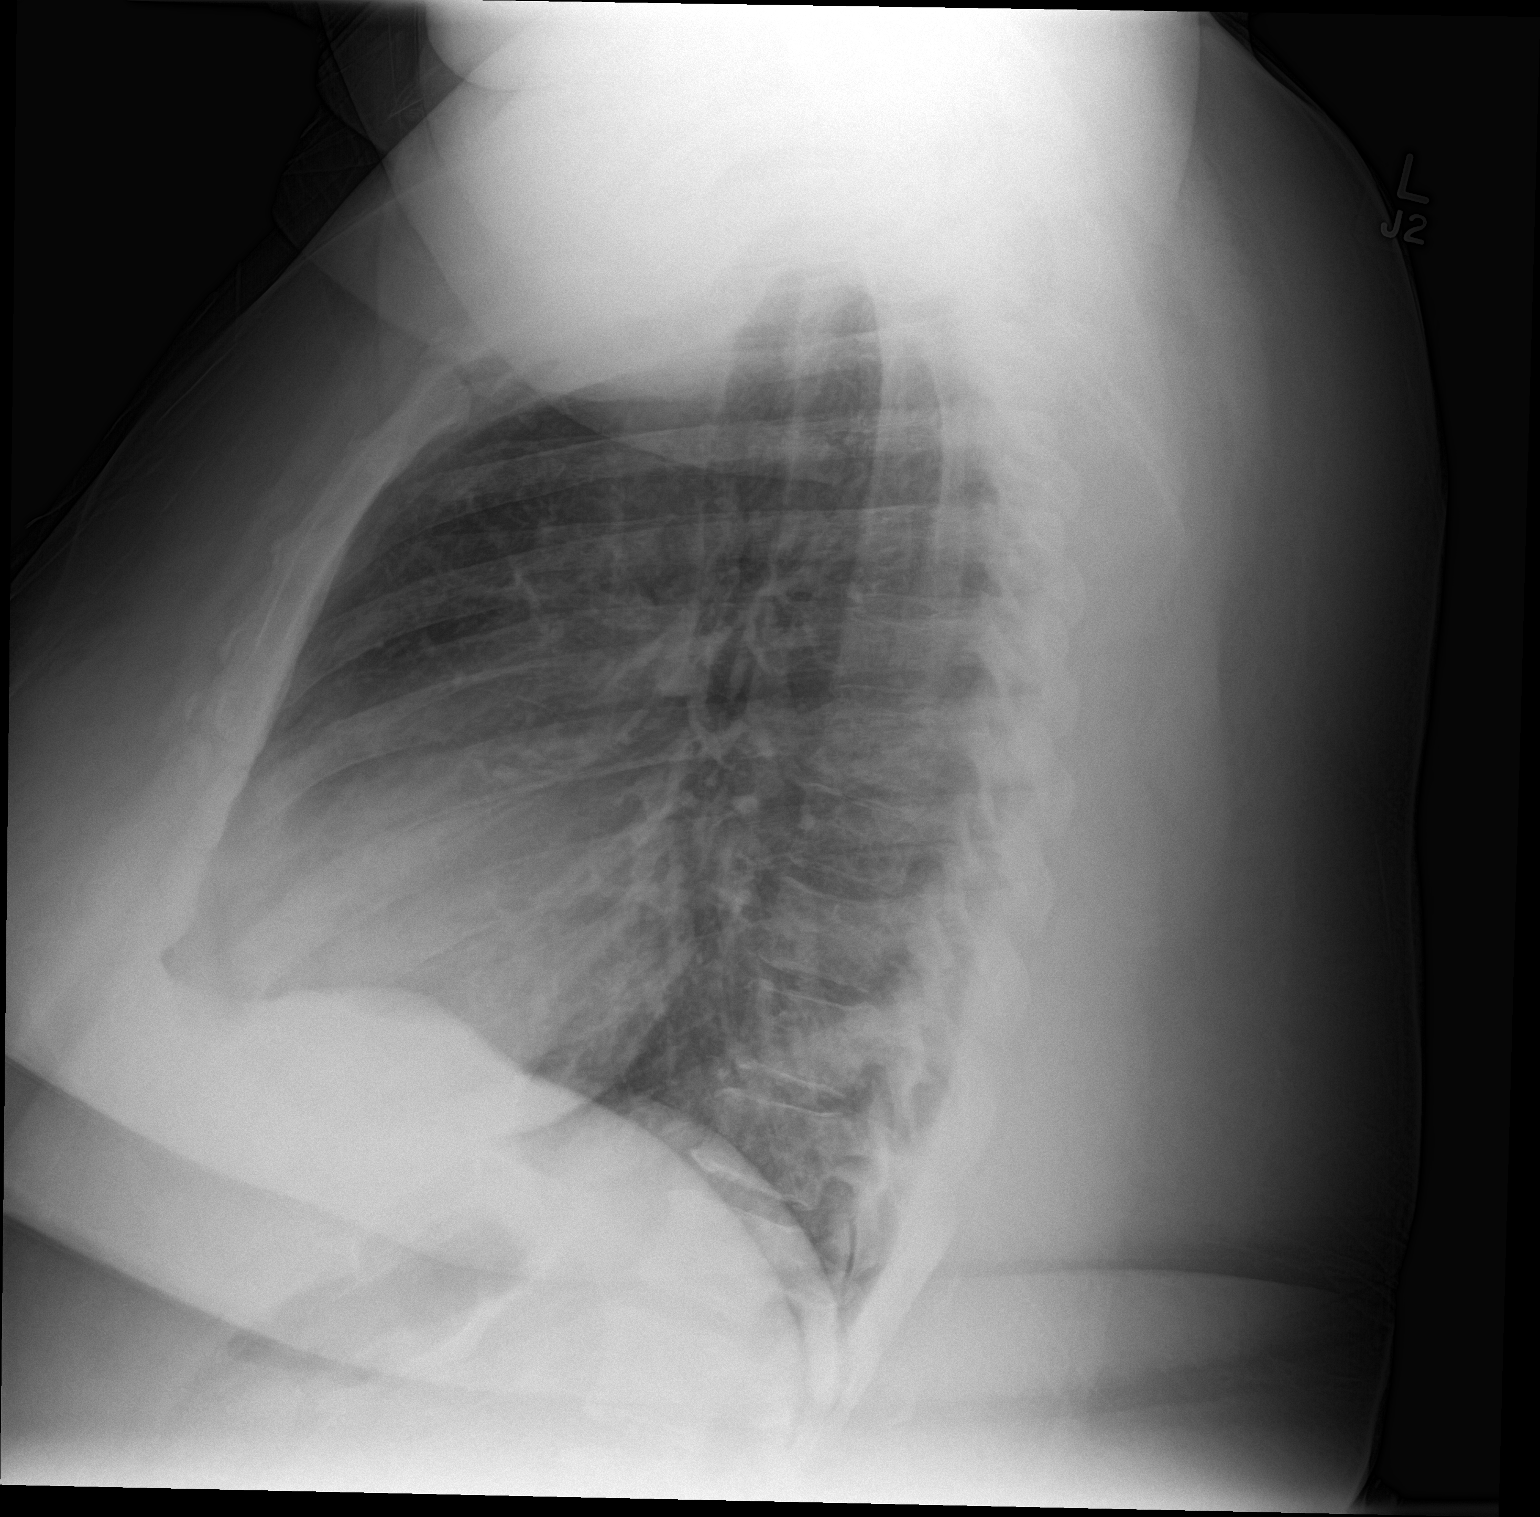

[2 of 2 positions shown; findings below may reference images not displayed]

FINDINGS: The heart size and mediastinal contours are within normal limits.
Both lungs are clear. The visualized skeletal structures are
unremarkable.
IMPRESSION: No active cardiopulmonary disease.

## 2016-09-06 IMAGING — CR DG CHEST 1V PORT
1 series · 1 of 1 positions shown · non-contrast
Comparison: 08/14/2015

CLINICAL DATA: Shortness of breath

EXAM:
PORTABLE CHEST 1 VIEW

[AP]
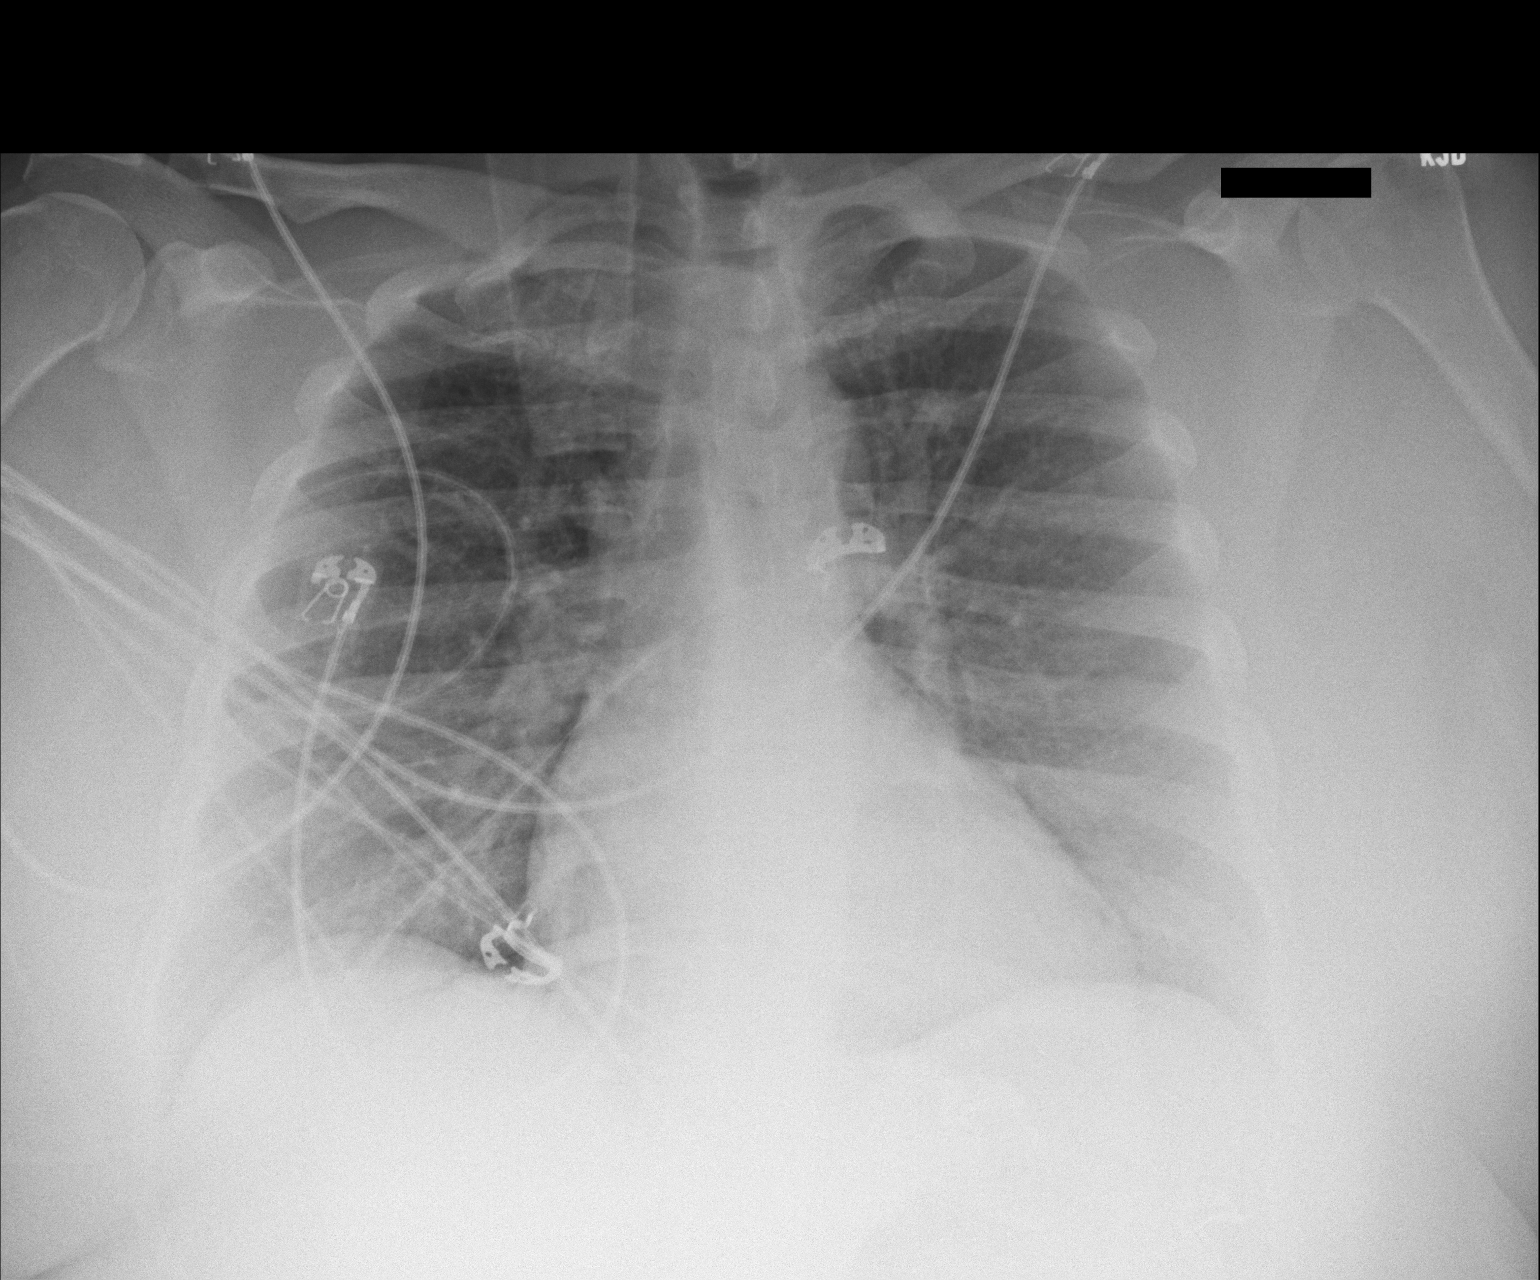

[1 of 1 positions shown; findings below may reference images not displayed]

FINDINGS: The heart size and mediastinal contours are within normal limits.
Both lungs are clear. The visualized skeletal structures are
unremarkable.
IMPRESSION: No active disease.

## 2016-09-07 IMAGING — CT CT CHEST W/O CM
2 of 3 series · 12 of 36 positions shown, 15 images · non-contrast
Comparison: Chest x-ray 08/22/2015

CLINICAL DATA: Shortness of Breath, persistent asthma

EXAM:
CT CHEST WITHOUT CONTRAST
TECHNIQUE: Multidetector CT imaging of the chest was performed following the
standard protocol without IV contrast.

[Series 201: chest with, idose (1) · axial · 0.82mm/px · z∈[-22,+228]mm · 9 of 118 slices shown, 12 images]
[im 9/118  mediastinal]
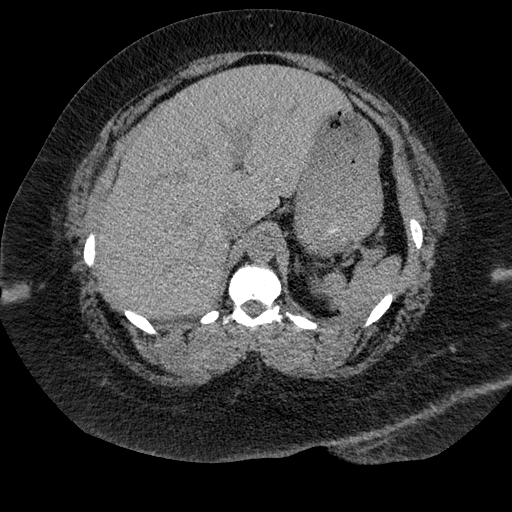
[im 9/118  lung]
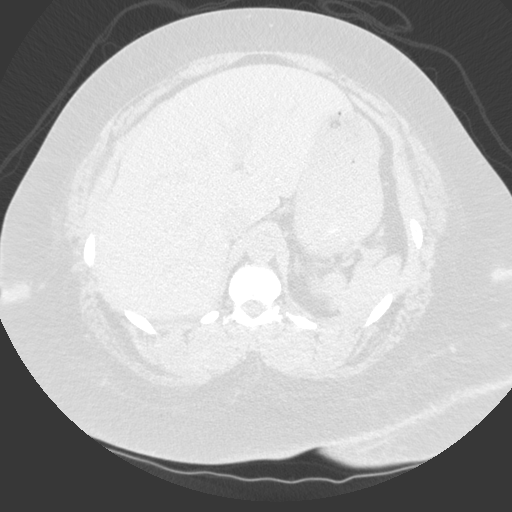
[im 22/118  lung]
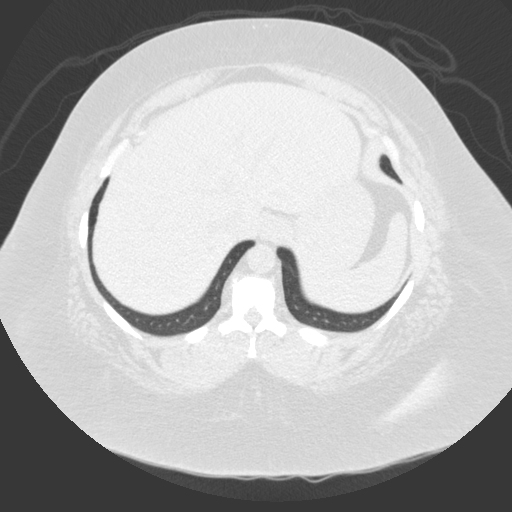
[im 35/118  lung]
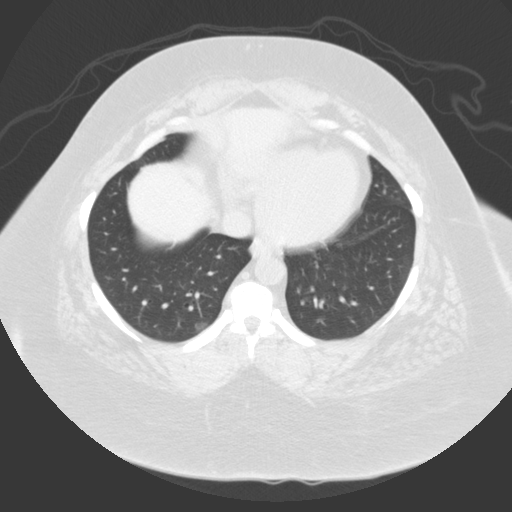
[im 48/118  lung]
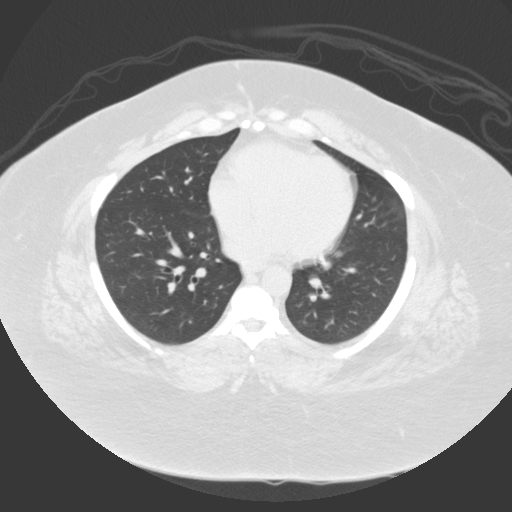
[im 61/118  mediastinal]
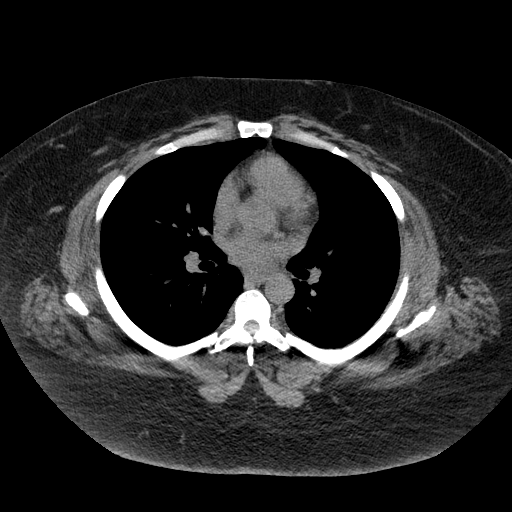
[im 61/118  lung]
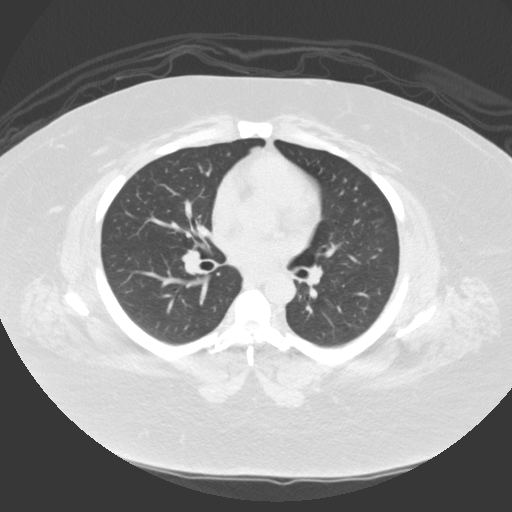
[im 70/118  lung]
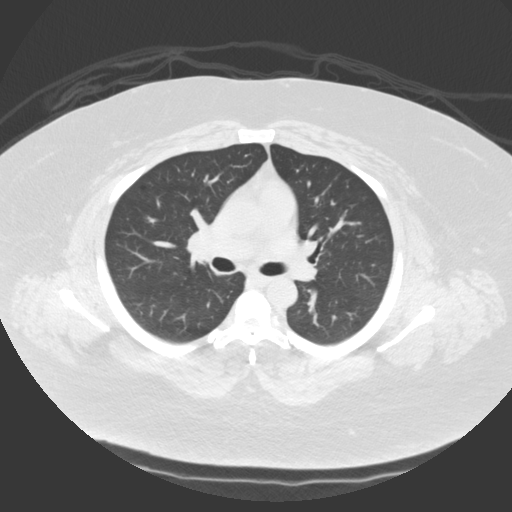
[im 83/118  lung]
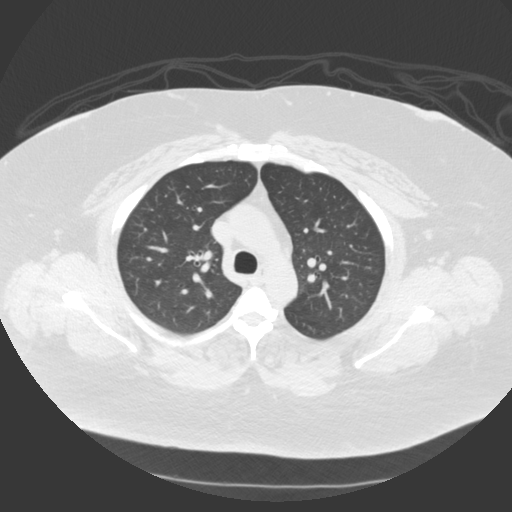
[im 96/118  lung]
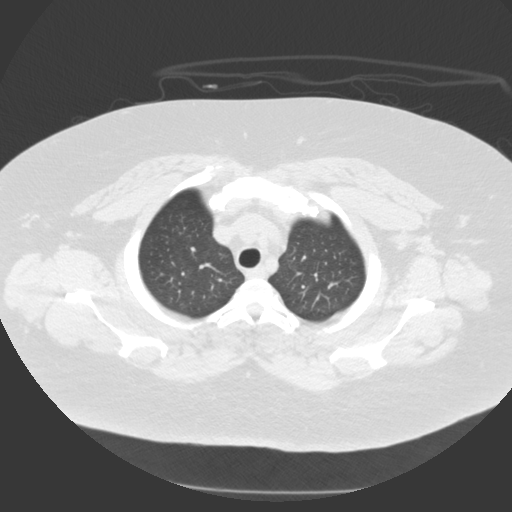
[im 109/118  mediastinal]
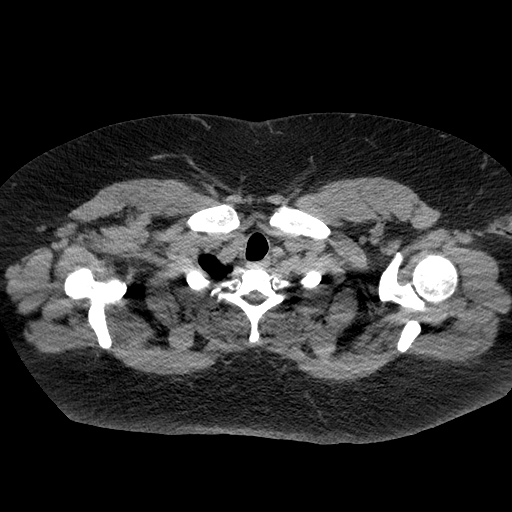
[im 109/118  lung]
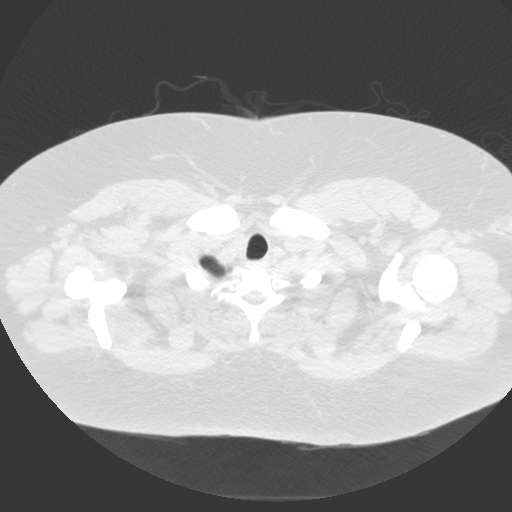

[Series 202: coronal, idose (3) · coronal · 0.45mm/px · 3 of 157 slices shown]
[im 32/157  lung]
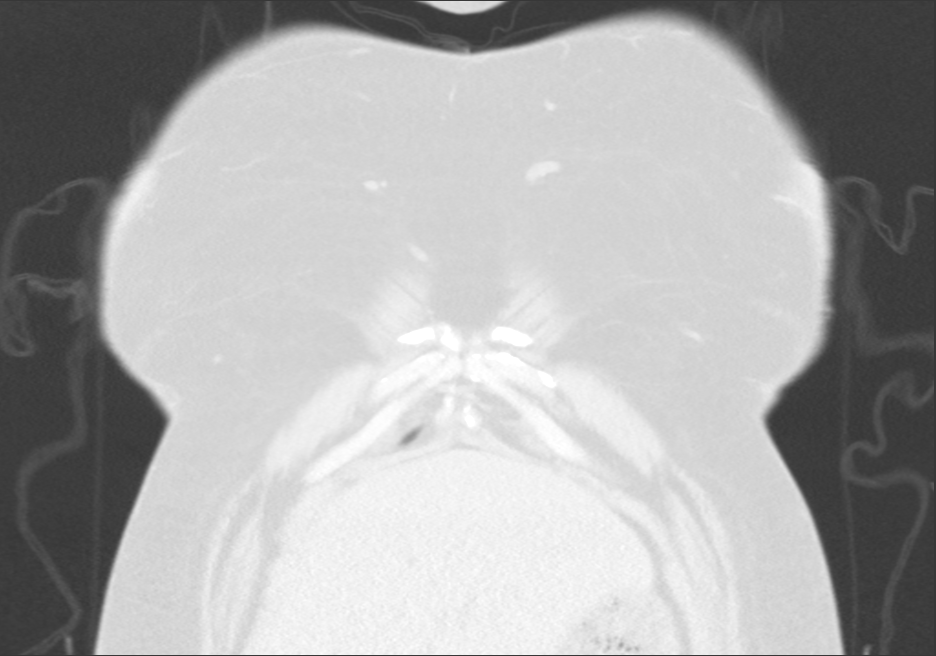
[im 63/157  lung]
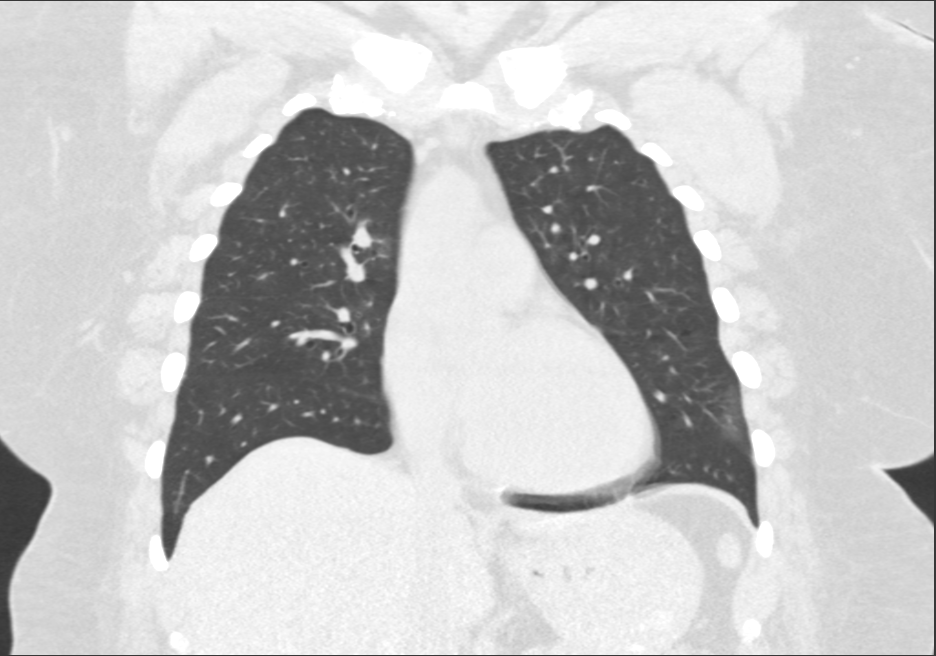
[im 94/157  lung]
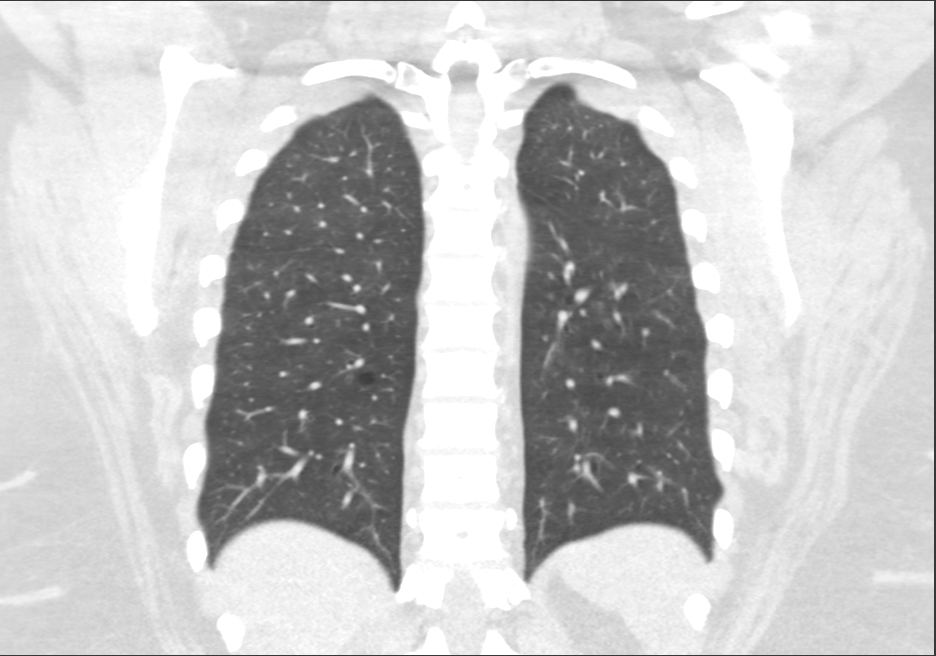

[12 of 36 positions shown; findings below may reference images not displayed]

FINDINGS: Mediastinum/Lymph Nodes: Central airways are patent. Images of the
thoracic inlet are unremarkable. No mediastinal hematoma or
adenopathy. Central pulmonary artery and thoracic aorta is
unremarkable. Heart size within normal limits. No pericardial
effusion. There is no hilar adenopathy on this unenhanced scan.

Lungs/Pleura: There is no pleural thickening or pleural effusion.
Images of lung parenchyma shows no acute infiltrate or pulmonary
edema. No bronchiectasis. No focal consolidation. No pneumothorax. A
single small emphysematous bulla is noted in right upper lobe
centrally.

Upper abdomen: The visualized unenhanced upper abdomen is
unremarkable. No adrenal gland mass is noted.

Musculoskeletal: Sagittal images of the spine shows no destructive
bony lesions. Mild degenerative changes upper and lower thoracic
spine. There are streaky artifacts from patient's large body
habitus.
IMPRESSION: 1. No acute infiltrate or pulmonary edema. No bronchiectasis. A
single tiny emphysematous bulla is noted in right upper lobe
centrally.
2. No mediastinal hematoma or adenopathy.  No hilar adenopathy.
3. Mild degenerative changes upper and lower thoracic spine.

## 2016-09-08 ENCOUNTER — Encounter (HOSPITAL_COMMUNITY): Payer: Self-pay | Admitting: Emergency Medicine

## 2016-09-08 ENCOUNTER — Emergency Department (HOSPITAL_COMMUNITY)
Admission: EM | Admit: 2016-09-08 | Discharge: 2016-09-08 | Disposition: A | Discharge status: Home / Self Care | Payer: Medicaid Other | Attending: Emergency Medicine | Admitting: Emergency Medicine

## 2016-09-08 DIAGNOSIS — Z87891 Personal history of nicotine dependence: Secondary | ICD-10-CM | POA: Diagnosis not present

## 2016-09-08 DIAGNOSIS — J449 Chronic obstructive pulmonary disease, unspecified: Secondary | ICD-10-CM | POA: Diagnosis not present

## 2016-09-08 DIAGNOSIS — L03115 Cellulitis of right lower limb: Secondary | ICD-10-CM | POA: Diagnosis not present

## 2016-09-08 DIAGNOSIS — Z79899 Other long term (current) drug therapy: Secondary | ICD-10-CM | POA: Insufficient documentation

## 2016-09-08 DIAGNOSIS — M79661 Pain in right lower leg: Secondary | ICD-10-CM | POA: Diagnosis present

## 2016-09-08 DIAGNOSIS — Z7984 Long term (current) use of oral hypoglycemic drugs: Secondary | ICD-10-CM | POA: Insufficient documentation

## 2016-09-08 DIAGNOSIS — I10 Essential (primary) hypertension: Secondary | ICD-10-CM | POA: Insufficient documentation

## 2016-09-08 MED ORDER — TRAMADOL HCL 50 MG PO TABS: 50.0000 mg | ORAL_TABLET | Freq: Four times a day (QID) | ORAL | 0 refills | Status: DC | PRN

## 2016-09-08 MED ORDER — TRAMADOL HCL 50 MG PO TABS
50.0000 mg | ORAL_TABLET | Freq: Once | ORAL | Status: AC
  Administered 2016-09-08: 50 mg via ORAL
  Filled 2016-09-08: qty 1

## 2016-09-08 MED ORDER — CLINDAMYCIN HCL 150 MG PO CAPS
300.0000 mg | ORAL_CAPSULE | Freq: Once | ORAL | Status: AC
  Administered 2016-09-08: 300 mg via ORAL
  Filled 2016-09-08: qty 2

## 2016-09-08 MED ORDER — CLINDAMYCIN HCL 150 MG PO CAPS: 300.0000 mg | ORAL_CAPSULE | Freq: Three times a day (TID) | ORAL | 0 refills | Status: DC

## 2016-09-08 NOTE — Discharge Instructions (Signed)
Take the prescribed medication as directed.  Keep area clean with soap and warm water.  Can use topical neosporin in addition to the antibiotics. Follow-up with your primary care doctor. Return to the ED for new or worsening symptoms.

## 2016-09-08 NOTE — ED Triage Notes (Signed)
Pt presents to ED for assessment of right lower leg pain where pt had some blisters which "came to a head" and then popped.  Several scabbed areas noted.  Patient also c/o some swelling to right leg as well.  Denies same to left.

## 2016-09-08 NOTE — ED Provider Notes (Signed)
[facility] DEPT Provider Note   CSN: [id number] Arrival date & time: 09/08/16  0106     History   Chief Complaint Chief Complaint  Patient presents with  . Leg Pain    HPI April [name] is a 46 y.o. female.  The history is provided by the patient and medical records.   46 y.o. F with hx of arthritis, asthma, COPD, depression, GERD< headaches, HTN, obesity, allergies, Presenting to the ED for right lower leg pain. Patient reports over the past few days she has noticed these lesions was localized bug bites on her right lower leg. States she has been scratching them because they itch and she has noticed some small amounts of pus.  States now she has noticed her right leg is sore, more so when walking. She's not had any fever or chills. She is not diabetic. States she has been cleaning the area with alcohol at home to get him to "dry out". No one at home with other bug bites. No other areas affected side from the right leg.  Past Medical History:  Diagnosis Date  . Acanthosis nigricans   . Arthritis   . Asthma    Followed by Dr. Annamaria Boots (pulmonology); receives every other week omalizumab injections; has frequent exacerbations  . COPD (chronic obstructive pulmonary disease) ([place])    PFTs in 2002, FEV1/FVC 65, no post bronchodilater test done  . Depression   . GERD (gastroesophageal reflux disease)   . Headache(784.0)   . Helicobacter pylori (H. pylori) infection   . Hypertension, essential   . Insomnia   . Menorrhagia   . Morbid obesity ([place])   . Obesity   . Seasonal allergies   . Shortness of breath   . Sleep apnea    Sleep study 2008 - mild OSA, not enough events to titrate CPAP  . Tobacco user     Patient Active Problem List   Diagnosis Date Noted  . Hypertensive urgency, malignant 06/22/2016  . Pulmonary edema 06/22/2016  . Hypertensive urgency 06/22/2016  . Malignant hypertension due to primary aldosteronism ([place]) 05/05/2016  . Lip laceration 05/05/2016  .  Elevated hemoglobin A1c 05/05/2016  . Asthma exacerbation 11/10/2015  . GERD (gastroesophageal reflux disease) 08/30/2015  . Generalized anxiety disorder 08/30/2015  . Seasonal allergic rhinitis 08/29/2013  . Tobacco abuse 10/07/2012  . Asthma, chronic obstructive, without status asthmaticus ([place]) 05/07/2012  . Knee pain, bilateral 04/25/2011  . Primary insomnia 03/14/2011  . Mild obstructive sleep apnea 12/19/2010  . Hypokalemia 08/13/2010  . Cervical back pain with evidence of disc disease 04/08/2008  . Essential hypertension 07/31/2006  . Morbid obesity with body mass index of 50.0-59.9 in adult ([place]) 06/17/2006  . Major depressive disorder, recurrent episode ([place]) 04/10/2006    Past Surgical History:  Procedure Laterality Date  . BREAST REDUCTION SURGERY  09/2011  . TUBAL LIGATION  1996   bilateral    OB History    No data available       Home Medications    Prior to Admission medications   Medication Sig Start Date End Date Taking? Authorizing Provider  acetaminophen (TYLENOL) 500 MG tablet Take 2 tablets (1,000 mg total) by mouth every 6 (six) hours as needed. 08/30/15   [name], MD  albuterol [facility] HFA) 108 [phone] Base) MCG/ACT inhaler Inhale 1-2 puffs into the lungs every 6 (six) hours as needed for wheezing or shortness of breath. 07/29/16   Baird Lyons D, MD  albuterol (PROVENTIL) (2.5 MG/3ML) 0.083% nebulizer  solution Take 3 mLs (2.5 mg total) by nebulization every 4 (four) hours as needed for wheezing or shortness of breath. 06/06/16   Baird Lyons D, MD  amLODipine (NORVASC) 10 MG tablet Take 1 tablet (10 mg total) by mouth daily. 05/03/16   Funches, Adriana Mccallum, MD  benzonatate (TESSALON) 100 MG capsule Take 1 capsule (100 mg total) by mouth 3 (three) times daily as needed for cough. 05/07/16   Geradine Girt, DO  diclofenac (VOLTAREN) 75 MG EC tablet Take 1 tablet (75 mg total) by mouth 2 (two) times daily. 08/05/16   Leandrew Koyanagi, MD  esomeprazole (NEXIUM) 40 MG  capsule Take 1 capsule (40 mg total) by mouth daily. 06/10/16   Deneise Lever, MD  fluticasone (FLONASE) 50 MCG/ACT nasal spray Place 2 sprays into both nostrils daily. 12/27/15   Elsie Stain, MD  guaiFENesin (MUCINEX) 600 MG 12 hr tablet Take 2 tablets (1,200 mg total) by mouth 2 (two) times daily. 05/07/16   Eulogio Bear U, DO  ipratropium (ATROVENT) 0.02 % nebulizer solution Take 2.5 mLs (0.5 mg total) by nebulization 4 (four) times daily as needed for wheezing or shortness of breath. 06/06/16   Baird Lyons D, MD  lisinopril (PRINIVIL,ZESTRIL) 40 MG tablet Take 1 tablet (40 mg total) by mouth daily. 06/25/16   Ghimire, Henreitta Leber, MD  loratadine (CLARITIN) 10 MG tablet Take 1 tablet (10 mg total) by mouth daily. 08/30/15   [name], MD  metFORMIN (GLUCOPHAGE) 500 MG tablet Take 1 tablet (500 mg total) by mouth 2 (two) times daily with a meal. 05/07/16   Geradine Girt, DO  methocarbamol (ROBAXIN) 500 MG tablet Take 1 tablet (500 mg total) by mouth 2 (two) times daily. 07/15/16   Hedges, Dellis Filbert, PA-C  mometasone-formoterol (DULERA) 200-5 MCG/ACT AERO Inhale 2 puffs into the lungs 2 (two) times daily. 06/06/16   Deneise Lever, MD  predniSONE (DELTASONE) 10 MG tablet 1-3 tabs daily as directed to help control asthma 07/29/16   Deneise Lever, MD  PROAIR HFA 108 [phone] Base) MCG/ACT inhaler INHALE 1-2 PUFFS INTO THELUNGS EVERY 6 HOURS AS NEEDED FOR WHEEZING OR SHORTNESS OF BREATH 08/16/16   Baird Lyons D, MD  sertraline (ZOLOFT) 50 MG tablet Take 1 tablet (50 mg total) by mouth daily. 08/30/15   [name], MD  spironolactone (ALDACTONE) 50 MG tablet Take 2 tablets (100 mg total) by mouth daily. 05/21/16   Funches, Adriana Mccallum, MD  traZODone (DESYREL) 50 MG tablet Take 0.5-1 tablets (25-50 mg total) by mouth at bedtime as needed for sleep. 05/21/16   Boykin Nearing, MD    Family History Family History  Problem Relation Age of Onset  . Hypertension Mother   . Asthma Daughter   . Cancer Paternal  Aunt   . Asthma Maternal Grandmother     Social History Social History  Substance Use Topics  . Smoking status: Former Smoker    Packs/day: 0.50    Years: 18.00    Types: Cigarettes    Quit date: 04/29/2014  . Smokeless tobacco: Never Used  . Alcohol use No     Allergies   Patient has no known allergies.   Review of Systems Review of Systems  Musculoskeletal: Positive for arthralgias.  All other systems reviewed and are negative.    Physical Exam Updated Vital Signs BP 130/89 (BP Location: Left Wrist)   Pulse (!) 102   Temp 98.6 F (37 C) (Oral)   Resp 18   Ht  5\' 6"  (1.676 m)   Wt (!) 145.2 kg   SpO2 98%   BMI 51.65 kg/m   Physical Exam  Constitutional: She is oriented to person, place, and time. She appears well-developed and well-nourished.  Morbidly obese  HENT:  Head: Normocephalic and atraumatic.  Mouth/Throat: Oropharynx is clear and moist.  Eyes: Conjunctivae and EOM are normal. Pupils are equal, round, and reactive to light.  Neck: Normal range of motion.  Cardiovascular: Normal rate, regular rhythm and normal heart sounds.   Pulmonary/Chest: Effort normal and breath sounds normal.  Abdominal: Soft. Bowel sounds are normal.  Musculoskeletal: Normal range of motion.  Multiple bug bite appearing lesions to right lower leg with signs of excoriation; there is surrounding redness and induration of lesions on lateral right calf; no areas of swelling or skin necrosis noted; no tissue crepitus; leg is neurovascularly intact  Neurological: She is alert and oriented to person, place, and time.  Skin: Skin is warm and dry.  Psychiatric: She has a normal mood and affect.  Nursing note and vitals reviewed.    ED Treatments / Results  Labs (all labs ordered are listed, but only abnormal results are displayed) Labs Reviewed - No data to display  EKG  EKG Interpretation None       Radiology No results found.  Procedures Procedures (including critical  care time)  Medications Ordered in ED Medications  clindamycin (CLEOCIN) capsule 300 mg (300 mg Oral Given 09/08/16 0359)  traMADol (ULTRAM) tablet 50 mg (50 mg Oral Given 09/08/16 0359)     Initial Impression / Assessment and Plan / ED Course  I have reviewed the triage vital signs and the nursing notes.  Pertinent labs & imaging results that were available during my care of the patient were reviewed by me and considered in my medical decision making (see chart for details).  46 year old female who with right lower leg pain. Appears to have bug bite type lesions of the right lower leg with a superimposed cellulitis. There are no areas of skin is crisis or tissue crepitus to suggest deep space infection of the leg. She is afebrile and nontoxic. Discussed home wound care, treatment with topical Neosporin. We'll also start oral clindamycin, first dose given here. Will have her follow-up with her primary care doctor.  Discussed plan with patient, she acknowledged understanding and agreed with plan of care.  Return precautions given for new or worsening symptoms.  Final Clinical Impressions(s) / ED Diagnoses   Final diagnoses:  Cellulitis of right lower extremity    New Prescriptions Discharge Medication List as of 09/08/2016  3:59 AM    START taking these medications   Details  clindamycin (CLEOCIN) 150 MG capsule Take 2 capsules (300 mg total) by mouth 3 (three) times daily. May dispense as 150mg  capsules, Starting Sun 09/08/2016, Print    traMADol (ULTRAM) 50 MG tablet Take 1 tablet (50 mg total) by mouth every 6 (six) hours as needed., Starting Sun 09/08/2016, Print         Larene Pickett, PA-C 09/08/16 3546    Jola Schmidt, MD 09/08/16 [phone]

## 2016-09-11 ENCOUNTER — Encounter: Payer: Self-pay | Admitting: Family Medicine

## 2016-09-29 IMAGING — DX DG KNEE COMPLETE 4+V*L*
4 series · 4 of 4 positions shown · non-contrast
Comparison: 03/13/2008

CLINICAL DATA: LEFT knee pain for 2 days, no injury

EXAM:
LEFT KNEE - COMPLETE 4+ VIEW

[knee ap]
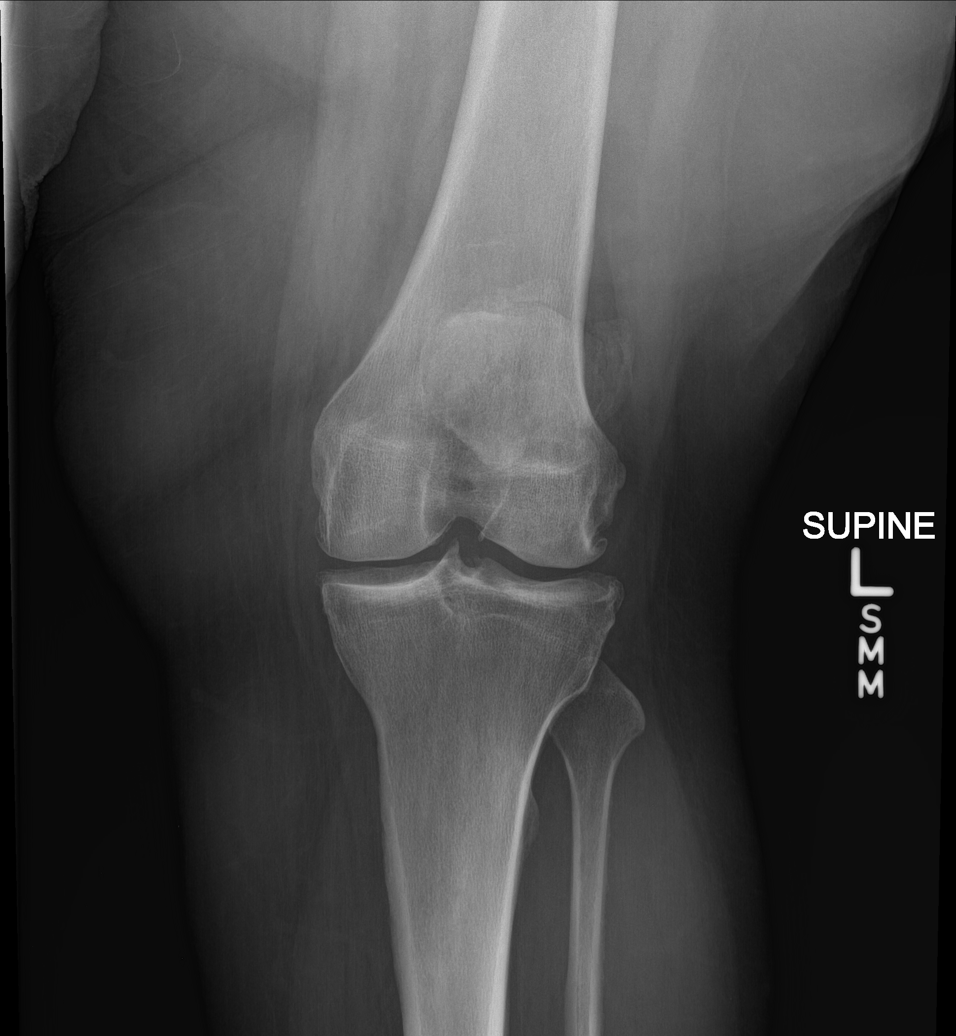

[knee obl (1 of 2)]
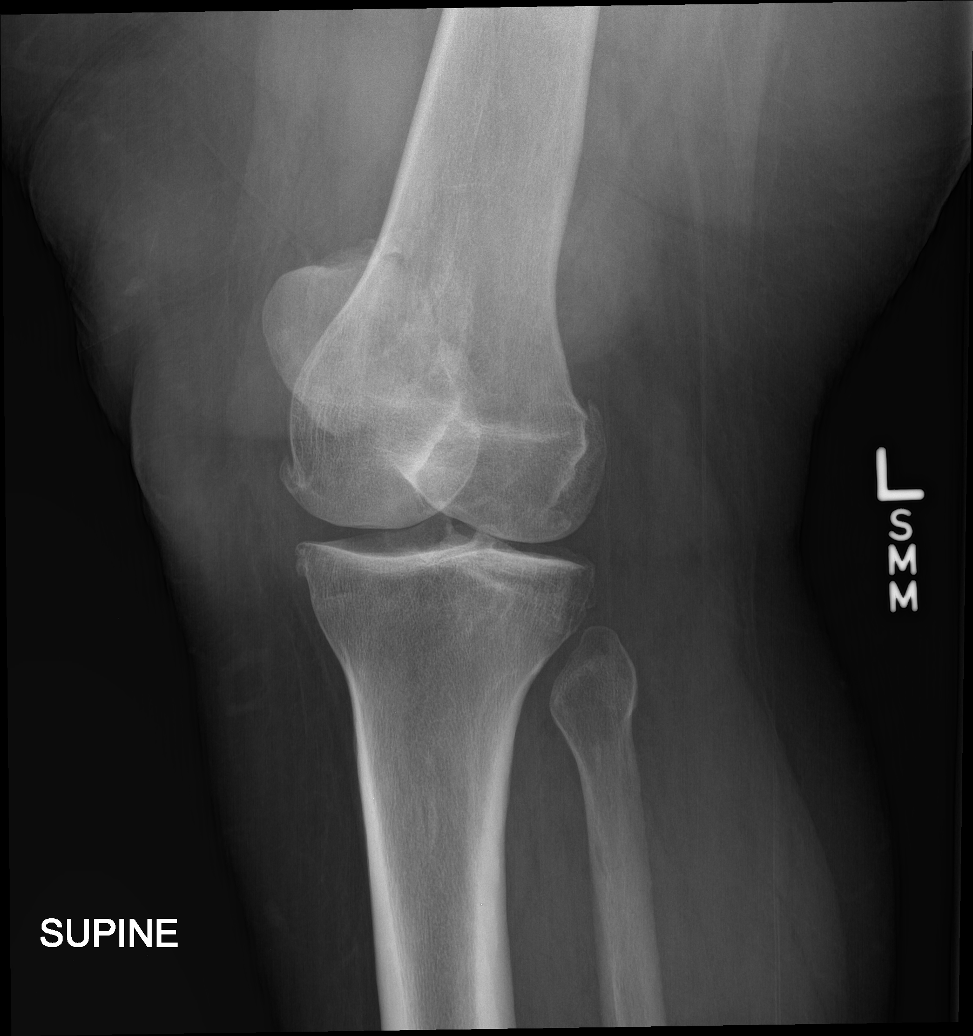

[knee obl (2 of 2)]
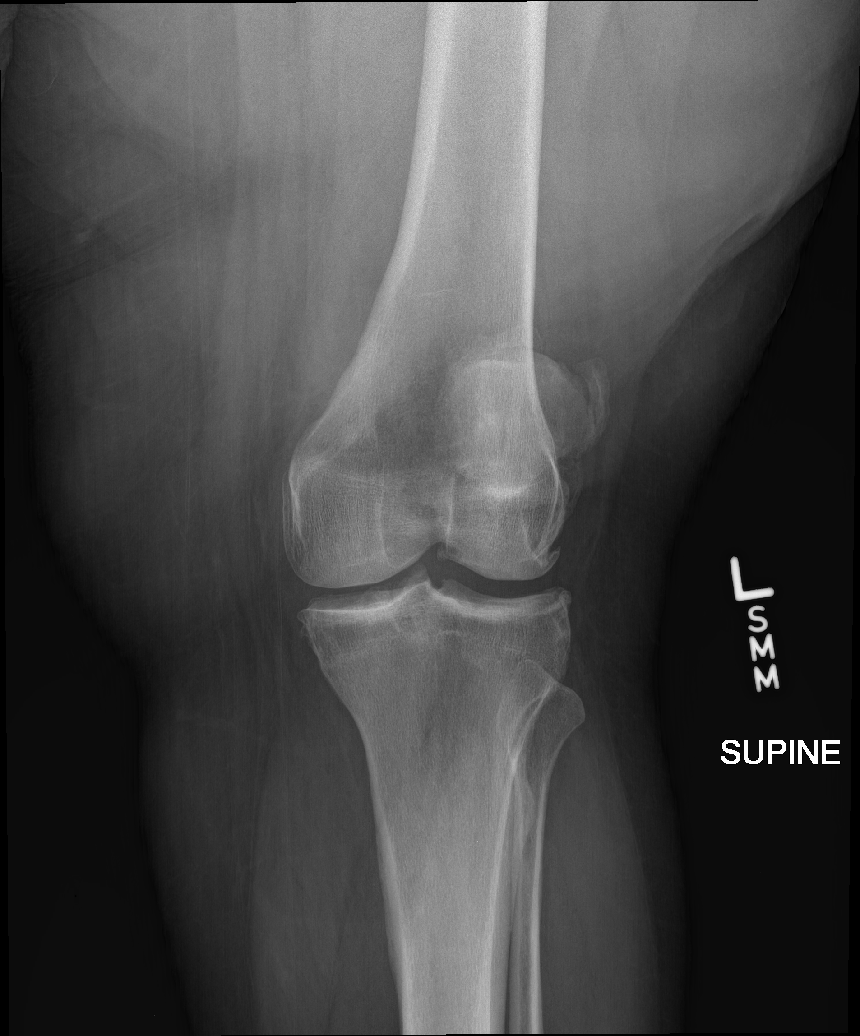

[knee lat]
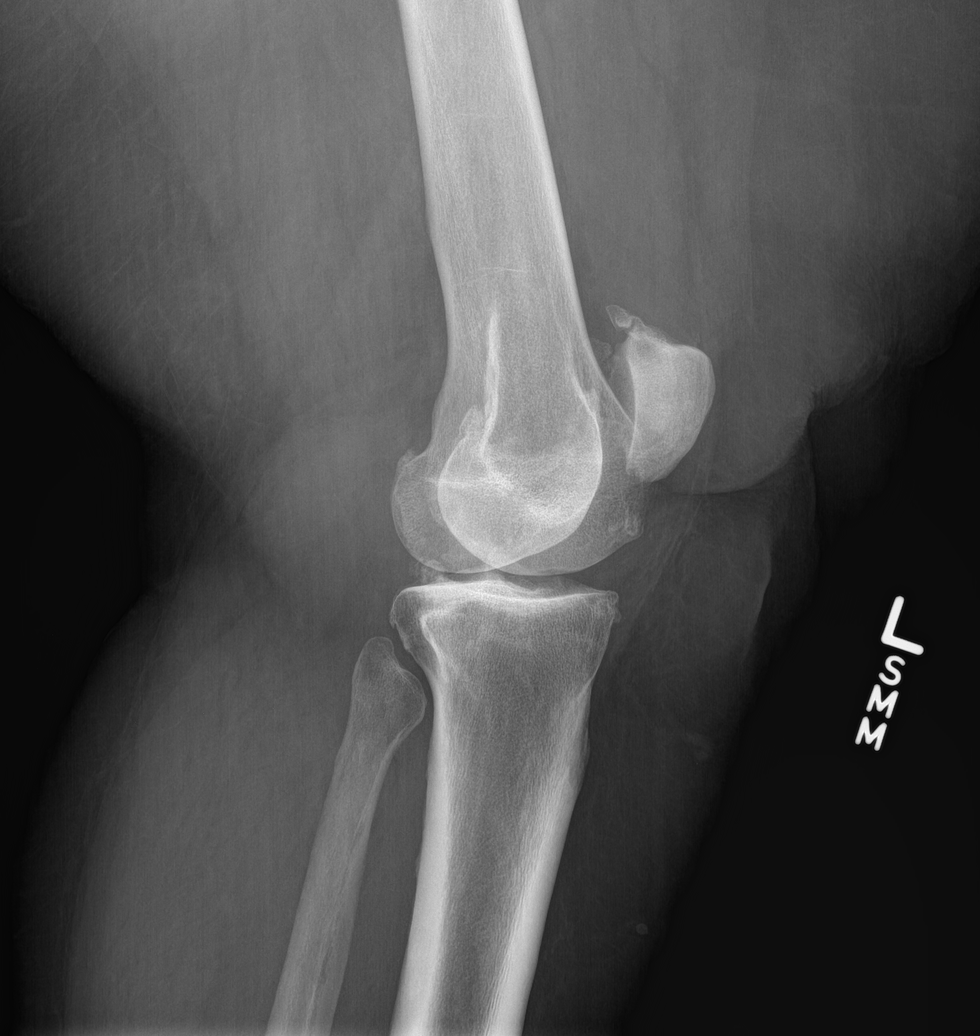

[4 of 4 positions shown; findings below may reference images not displayed]

FINDINGS: Tricompartmental osteoarthritic changes with joint space narrowing
and spur formation.

Osseous mineralization normal.

No acute fracture, dislocation or bone destruction.

Obliquity on lateral view.

Soft tissues unremarkable.
IMPRESSION: Tricompartmental osteoarthritic changes mildly progressive since

## 2016-09-30 ENCOUNTER — Emergency Department (HOSPITAL_COMMUNITY)
Admission: EM | Admit: 2016-09-30 | Discharge: 2016-09-30 | Disposition: A | Discharge status: Home / Self Care | Payer: Medicaid Other | Attending: Emergency Medicine | Admitting: Emergency Medicine

## 2016-09-30 ENCOUNTER — Emergency Department (HOSPITAL_COMMUNITY): Payer: Medicaid Other

## 2016-09-30 ENCOUNTER — Encounter (HOSPITAL_COMMUNITY): Payer: Self-pay | Admitting: Emergency Medicine

## 2016-09-30 DIAGNOSIS — R062 Wheezing: Secondary | ICD-10-CM | POA: Diagnosis present

## 2016-09-30 DIAGNOSIS — I10 Essential (primary) hypertension: Secondary | ICD-10-CM | POA: Insufficient documentation

## 2016-09-30 DIAGNOSIS — Z79899 Other long term (current) drug therapy: Secondary | ICD-10-CM | POA: Insufficient documentation

## 2016-09-30 DIAGNOSIS — M25561 Pain in right knee: Secondary | ICD-10-CM

## 2016-09-30 DIAGNOSIS — M25562 Pain in left knee: Secondary | ICD-10-CM

## 2016-09-30 DIAGNOSIS — J4541 Moderate persistent asthma with (acute) exacerbation: Secondary | ICD-10-CM

## 2016-09-30 DIAGNOSIS — Z87891 Personal history of nicotine dependence: Secondary | ICD-10-CM | POA: Insufficient documentation

## 2016-09-30 DIAGNOSIS — Z7984 Long term (current) use of oral hypoglycemic drugs: Secondary | ICD-10-CM | POA: Insufficient documentation

## 2016-09-30 DIAGNOSIS — J449 Chronic obstructive pulmonary disease, unspecified: Secondary | ICD-10-CM | POA: Insufficient documentation

## 2016-09-30 MED ORDER — PREDNISONE 20 MG PO TABS
60.0000 mg | ORAL_TABLET | Freq: Once | ORAL | Status: AC
  Administered 2016-09-30: 60 mg via ORAL
  Filled 2016-09-30: qty 3

## 2016-09-30 MED ORDER — AEROCHAMBER PLUS FLO-VU MEDIUM MISC
1.0000 | Freq: Once | Status: DC
  Filled 2016-09-30 (×2): qty 1

## 2016-09-30 MED ORDER — ALBUTEROL SULFATE HFA 108 (90 BASE) MCG/ACT IN AERS
2.0000 | INHALATION_SPRAY | RESPIRATORY_TRACT | Status: DC | PRN
  Administered 2016-09-30: 2 via RESPIRATORY_TRACT
  Filled 2016-09-30: qty 6.7

## 2016-09-30 MED ORDER — ALBUTEROL SULFATE (2.5 MG/3ML) 0.083% IN NEBU
5.0000 mg | INHALATION_SOLUTION | Freq: Once | RESPIRATORY_TRACT | Status: AC
  Administered 2016-09-30: 5 mg via RESPIRATORY_TRACT

## 2016-09-30 MED ORDER — PREDNISONE 10 MG (21) PO TBPK: ORAL_TABLET | Freq: Every day | ORAL | 0 refills | Status: DC

## 2016-09-30 MED ORDER — ALBUTEROL SULFATE (2.5 MG/3ML) 0.083% IN NEBU
INHALATION_SOLUTION | RESPIRATORY_TRACT | Status: AC
  Filled 2016-09-30: qty 3

## 2016-09-30 MED ORDER — ALBUTEROL SULFATE (2.5 MG/3ML) 0.083% IN NEBU
5.0000 mg | INHALATION_SOLUTION | Freq: Once | RESPIRATORY_TRACT | Status: AC
  Administered 2016-09-30: 5 mg via RESPIRATORY_TRACT
  Filled 2016-09-30: qty 6

## 2016-09-30 NOTE — ED Notes (Signed)
Pt sts she has spacers at home and she didn't need another one

## 2016-09-30 NOTE — ED Provider Notes (Signed)
[facility] DEPT Provider Note   CSN: [id number] Arrival date & time: 09/30/16  1238     History   Chief Complaint Chief Complaint  Patient presents with  . Asthma    HPI April [name] is a 46 y.o. female.  HPI  This 46 year old female with a history of asthma who presents today stating that she has had increased wheezing over the past several days. He has been using her albuterol to 2 puffs every 4 hours as well as her ipratropium. It is unclear exactly how she has been using each of these. She states she has a history of asthma and has had increased wheezing. He states that several weeks ago she was seen by Dr. Annamaria Boots and had a steroid taper. She has had increased wheezing over the past several days has not been on steroids. She denies any fever, chills, or increased sputum production. She states that she was followed at the health survey but was kicked out she did not show for an appointment. She states that she did not realize she had the appointment. She has not been taking her metformin or her blood pressure medications as prescribed due to this. She states that she was given another number to call to follow-up. She denies environmental triggers states that her air conditioning has been working well.  Past Medical History:  Diagnosis Date  . Acanthosis nigricans   . Arthritis   . Asthma    Followed by Dr. Annamaria Boots (pulmonology); receives every other week omalizumab injections; has frequent exacerbations  . COPD (chronic obstructive pulmonary disease) ([place])    PFTs in 2002, FEV1/FVC 65, no post bronchodilater test done  . Depression   . GERD (gastroesophageal reflux disease)   . Headache(784.0)   . Helicobacter pylori (H. pylori) infection   . Hypertension, essential   . Insomnia   . Menorrhagia   . Morbid obesity ([place])   . Obesity   . Seasonal allergies   . Shortness of breath   . Sleep apnea    Sleep study 2008 - mild OSA, not enough events to titrate CPAP  . Tobacco  user     Patient Active Problem List   Diagnosis Date Noted  . Hypertensive urgency, malignant 06/22/2016  . Pulmonary edema 06/22/2016  . Hypertensive urgency 06/22/2016  . Malignant hypertension due to primary aldosteronism ([place]) 05/05/2016  . Lip laceration 05/05/2016  . Elevated hemoglobin A1c 05/05/2016  . Asthma exacerbation 11/10/2015  . GERD (gastroesophageal reflux disease) 08/30/2015  . Generalized anxiety disorder 08/30/2015  . Seasonal allergic rhinitis 08/29/2013  . Tobacco abuse 10/07/2012  . Asthma, chronic obstructive, without status asthmaticus ([place]) 05/07/2012  . Knee pain, bilateral 04/25/2011  . Primary insomnia 03/14/2011  . Mild obstructive sleep apnea 12/19/2010  . Hypokalemia 08/13/2010  . Cervical back pain with evidence of disc disease 04/08/2008  . Essential hypertension 07/31/2006  . Morbid obesity with body mass index of 50.0-59.9 in adult ([place]) 06/17/2006  . Major depressive disorder, recurrent episode ([place]) 04/10/2006    Past Surgical History:  Procedure Laterality Date  . BREAST REDUCTION SURGERY  09/2011  . TUBAL LIGATION  1996   bilateral    OB History    No data available       Home Medications    Prior to Admission medications   Medication Sig Start Date End Date Taking? Authorizing Provider  acetaminophen (TYLENOL) 500 MG tablet Take 2 tablets (1,000 mg total) by mouth every 6 (six) hours as needed. Patient  taking differently: Take 1,000 mg by mouth every 6 (six) hours as needed for mild pain.  08/30/15  Yes [name], MD  albuterol [facility] HFA) [phone] Base) MCG/ACT inhaler Inhale 1-2 puffs into the lungs every 6 (six) hours as needed for wheezing or shortness of breath. 07/29/16  Yes Young, Tarri Fuller D, MD  albuterol (PROVENTIL) (2.5 MG/3ML) 0.083% nebulizer solution Take 3 mLs (2.5 mg total) by nebulization every 4 (four) hours as needed for wheezing or shortness of breath. 06/06/16  Yes Young, Tarri Fuller D, MD  amLODipine (NORVASC) 10  MG tablet Take 1 tablet (10 mg total) by mouth daily. 05/03/16  Yes Funches, Josalyn, MD  benzonatate (TESSALON) 100 MG capsule Take 1 capsule (100 mg total) by mouth 3 (three) times daily as needed for cough. 05/07/16  Yes Geradine Girt, DO  diclofenac (VOLTAREN) 75 MG EC tablet Take 1 tablet (75 mg total) by mouth 2 (two) times daily. 08/05/16  Yes Leandrew Koyanagi, MD  esomeprazole (NEXIUM) 40 MG capsule Take 1 capsule (40 mg total) by mouth daily. 06/10/16  Yes Young, Tarri Fuller D, MD  fluticasone (FLONASE) 50 MCG/ACT nasal spray Place 2 sprays into both nostrils daily. 12/27/15  Yes Elsie Stain, MD  guaiFENesin (MUCINEX) 600 MG 12 hr tablet Take 2 tablets (1,200 mg total) by mouth 2 (two) times daily. Patient taking differently: Take 1,200 mg by mouth 2 (two) times daily as needed for cough.  05/07/16  Yes Vann, Jessica U, DO  ipratropium (ATROVENT) 0.02 % nebulizer solution Take 2.5 mLs (0.5 mg total) by nebulization 4 (four) times daily as needed for wheezing or shortness of breath. 06/06/16  Yes Young, Tarri Fuller D, MD  loratadine (CLARITIN) 10 MG tablet Take 1 tablet (10 mg total) by mouth daily. 08/30/15  Yes [name], MD  metFORMIN (GLUCOPHAGE) 500 MG tablet Take 1 tablet (500 mg total) by mouth 2 (two) times daily with a meal. Patient taking differently: Take 1,000 mg by mouth daily.  05/07/16  Yes Geradine Girt, DO  methocarbamol (ROBAXIN) 500 MG tablet Take 1 tablet (500 mg total) by mouth 2 (two) times daily. Patient taking differently: Take 500 mg by mouth daily.  07/15/16  Yes Hedges, Dellis Filbert, PA-C  mometasone-formoterol (DULERA) 200-5 MCG/ACT AERO Inhale 2 puffs into the lungs 2 (two) times daily. Patient taking differently: Inhale 1 puff into the lungs 2 (two) times daily.  06/06/16  Yes Young, Tarri Fuller D, MD  OVER THE COUNTER MEDICATION Place 1 patch onto the skin as needed (Nicotine therapy).   Yes [provider]  predniSONE (DELTASONE) 10 MG tablet 1-3 tabs daily as directed to help  control asthma Patient taking differently: Take 10-20 mg by mouth daily. 1-3 tabs daily as directed to help control asthma 07/29/16  Yes Young, Tarri Fuller D, MD  sertraline (ZOLOFT) 50 MG tablet Take 1 tablet (50 mg total) by mouth daily. 08/30/15  Yes [name], MD  spironolactone (ALDACTONE) 50 MG tablet Take 2 tablets (100 mg total) by mouth daily. Patient taking differently: Take 50 mg by mouth daily.  05/21/16  Yes Funches, Josalyn, MD  traMADol (ULTRAM) 50 MG tablet Take 1 tablet (50 mg total) by mouth every 6 (six) hours as needed. Patient taking differently: Take 50-100 mg by mouth every 6 (six) hours as needed.  09/08/16  Yes Larene Pickett, PA-C  traZODone (DESYREL) 50 MG tablet Take 0.5-1 tablets (25-50 mg total) by mouth at bedtime as needed for sleep. Patient taking differently: Take  50 mg by mouth daily.  05/21/16  Yes Funches, Josalyn, MD  clindamycin (CLEOCIN) 150 MG capsule Take 2 capsules (300 mg total) by mouth 3 (three) times daily. May dispense as 150mg  capsules Patient not taking: Reported on 09/30/2016 09/08/16   Larene Pickett, PA-C  lisinopril (PRINIVIL,ZESTRIL) 40 MG tablet Take 1 tablet (40 mg total) by mouth daily. Patient not taking: Reported on 09/30/2016 06/25/16   Jonetta Osgood, MD  PROAIR HFA 108 [phone] Base) MCG/ACT inhaler INHALE 1-2 PUFFS INTO THELUNGS EVERY 6 HOURS AS NEEDED FOR WHEEZING OR SHORTNESS OF BREATH Patient not taking: Reported on 09/30/2016 08/16/16   Deneise Lever, MD    Family History Family History  Problem Relation Age of Onset  . Hypertension Mother   . Asthma Daughter   . Cancer Paternal Aunt   . Asthma Maternal Grandmother     Social History Social History  Substance Use Topics  . Smoking status: Former Smoker    Packs/day: 0.50    Years: 18.00    Types: Cigarettes    Quit date: 04/29/2014  . Smokeless tobacco: Never Used  . Alcohol use No     Allergies   Patient has no known allergies.   Review of Systems Review of Systems    Constitutional: Negative.   HENT: Negative.   Eyes: Negative.   Respiratory: Positive for cough.   Cardiovascular: Negative.   Gastrointestinal: Negative.   Endocrine: Negative.   Genitourinary: Negative.   Musculoskeletal: Negative.   Skin: Negative.   Allergic/Immunologic: Negative.   Neurological: Negative.   Hematological: Negative.   Psychiatric/Behavioral: Negative.   All other systems reviewed and are negative.    Physical Exam Updated Vital Signs BP (!) 166/102   Pulse 96   Temp 98.2 F (36.8 C)   Resp 20   Ht 1.676 m (5\' 6" )   Wt (!) 145.2 kg (320 lb)   LMP 09/28/2016 (Exact Date)   SpO2 100%   BMI 51.65 kg/m   Physical Exam  Constitutional: She is oriented to person, place, and time. She appears well-developed and well-nourished.  Morbidly obese female who does not appear to be in acute distress  HENT:  Head: Normocephalic and atraumatic.  Right Ear: External ear normal.  Left Ear: External ear normal.  Mouth/Throat: Oropharynx is clear and moist.  Eyes: Pupils are equal, round, and reactive to light.  Neck: Normal range of motion.  Cardiovascular: Normal rate and regular rhythm.   Pulmonary/Chest: Effort normal.  Mild tachypnea with stridor noted. Underlying wheezing is difficult to assess due to stridor  Abdominal: Soft. Bowel sounds are normal. There is no tenderness.  Musculoskeletal: Normal range of motion. She exhibits no edema, tenderness or deformity.  Neurological: She is alert and oriented to person, place, and time.  Skin: Skin is warm. Capillary refill takes less than 2 seconds.  Psychiatric: She has a normal mood and affect.  Nursing note and vitals reviewed.    ED Treatments / Results  Labs (all labs ordered are listed, but only abnormal results are displayed) Labs Reviewed - No data to display  EKG  EKG Interpretation None       Radiology Dg Chest 2 View  Result Date: 09/30/2016 CLINICAL DATA:  Worsening asthma EXAM: CHEST   2 VIEW COMPARISON:  06/21/2016 FINDINGS: The heart size and mediastinal contours are within normal limits. Both lungs are clear. The visualized skeletal structures are unremarkable. IMPRESSION: No active cardiopulmonary disease. Electronically Signed   By: Lovena Le  Clovis Riley M.D.   On: 09/30/2016 13:18    Procedures Procedures (including critical care time)  Medications Ordered in ED Medications  albuterol (PROVENTIL) (2.5 MG/3ML) 0.083% nebulizer solution 5 mg (5 mg Nebulization Given 09/30/16 1249)  predniSONE (DELTASONE) tablet 60 mg (60 mg Oral Given 09/30/16 1517)  albuterol (PROVENTIL) (2.5 MG/3ML) 0.083% nebulizer solution 5 mg (5 mg Nebulization Given 09/30/16 1517)     Initial Impression / Assessment and Plan / ED Course  I have reviewed the triage vital signs and the nursing notes.  Pertinent labs & imaging results that were available during my care of the patient were reviewed by me and considered in my medical decision making (see chart for details).    Patient resting comfortably no wheezing noted on reexam. Sats 100%. Discussed that we will give her albuterol inhaler here, put her on prednisone, and will hold antibiotics at this time. She states that she has some sore throat. Her oropharynx is clear with no tonsillar swelling or exudate. She has not had fever or productive cough. We have discussed that she should return if she is worse and follow-up with Dr. Annamaria Boots as soon as possible. She voices understanding of this plan.   Final Clinical Impressions(s) / ED Diagnoses   Final diagnoses:  Moderate persistent asthma with exacerbation    New Prescriptions New Prescriptions   PREDNISONE (STERAPRED UNI-PAK 21 TAB) 10 MG (21) TBPK TABLET    Take by mouth daily. Take 6 tabs by mouth daily  for 2 days, then 5 tabs for 2 days, then 4 tabs for 2 days, then 3 tabs for 2 days, 2 tabs for 2 days, then 1 tab by mouth daily for 2 days     Pattricia Boss, MD 09/30/16 [id number]

## 2016-09-30 NOTE — ED Notes (Signed)
Pt provided with [place] sandwich and water to have after neb

## 2016-09-30 NOTE — ED Notes (Signed)
Pt ambulatory to the restroom.  

## 2016-09-30 NOTE — ED Triage Notes (Signed)
Pt from home with c/o worsening asthma over the last several days.  Pt reports home neb treatments have brought no relief.  Inspiratory and expiratory wheezing in triage.  SpO2 99% RA.  NAD, A&O.

## 2016-10-02 ENCOUNTER — Encounter (HOSPITAL_COMMUNITY): Payer: Self-pay

## 2016-10-02 ENCOUNTER — Inpatient Hospital Stay (HOSPITAL_COMMUNITY)
Admission: EM | Admit: 2016-10-02 | Discharge: 2016-10-04 | DRG: 202 | Disposition: A | Discharge status: Home / Self Care | Payer: Medicaid Other | Attending: Internal Medicine | Admitting: Internal Medicine

## 2016-10-02 ENCOUNTER — Inpatient Hospital Stay (HOSPITAL_COMMUNITY): Payer: Medicaid Other

## 2016-10-02 DIAGNOSIS — D72829 Elevated white blood cell count, unspecified: Secondary | ICD-10-CM | POA: Diagnosis present

## 2016-10-02 DIAGNOSIS — F330 Major depressive disorder, recurrent, mild: Secondary | ICD-10-CM

## 2016-10-02 DIAGNOSIS — J302 Other seasonal allergic rhinitis: Secondary | ICD-10-CM | POA: Diagnosis present

## 2016-10-02 DIAGNOSIS — J9601 Acute respiratory failure with hypoxia: Secondary | ICD-10-CM | POA: Diagnosis present

## 2016-10-02 DIAGNOSIS — D649 Anemia, unspecified: Secondary | ICD-10-CM | POA: Diagnosis present

## 2016-10-02 DIAGNOSIS — I160 Hypertensive urgency: Secondary | ICD-10-CM | POA: Diagnosis present

## 2016-10-02 DIAGNOSIS — M25569 Pain in unspecified knee: Secondary | ICD-10-CM

## 2016-10-02 DIAGNOSIS — I5033 Acute on chronic diastolic (congestive) heart failure: Secondary | ICD-10-CM | POA: Diagnosis present

## 2016-10-02 DIAGNOSIS — Z8249 Family history of ischemic heart disease and other diseases of the circulatory system: Secondary | ICD-10-CM

## 2016-10-02 DIAGNOSIS — E1165 Type 2 diabetes mellitus with hyperglycemia: Secondary | ICD-10-CM | POA: Diagnosis present

## 2016-10-02 DIAGNOSIS — G4733 Obstructive sleep apnea (adult) (pediatric): Secondary | ICD-10-CM | POA: Diagnosis present

## 2016-10-02 DIAGNOSIS — Z825 Family history of asthma and other chronic lower respiratory diseases: Secondary | ICD-10-CM

## 2016-10-02 DIAGNOSIS — I517 Cardiomegaly: Secondary | ICD-10-CM | POA: Diagnosis not present

## 2016-10-02 DIAGNOSIS — J4521 Mild intermittent asthma with (acute) exacerbation: Secondary | ICD-10-CM | POA: Diagnosis not present

## 2016-10-02 DIAGNOSIS — E119 Type 2 diabetes mellitus without complications: Secondary | ICD-10-CM

## 2016-10-02 DIAGNOSIS — Z794 Long term (current) use of insulin: Secondary | ICD-10-CM

## 2016-10-02 DIAGNOSIS — I110 Hypertensive heart disease with heart failure: Secondary | ICD-10-CM | POA: Diagnosis present

## 2016-10-02 DIAGNOSIS — F332 Major depressive disorder, recurrent severe without psychotic features: Secondary | ICD-10-CM | POA: Diagnosis not present

## 2016-10-02 DIAGNOSIS — Z5329 Procedure and treatment not carried out because of patient's decision for other reasons: Secondary | ICD-10-CM | POA: Diagnosis present

## 2016-10-02 DIAGNOSIS — K219 Gastro-esophageal reflux disease without esophagitis: Secondary | ICD-10-CM | POA: Diagnosis present

## 2016-10-02 DIAGNOSIS — Z7984 Long term (current) use of oral hypoglycemic drugs: Secondary | ICD-10-CM | POA: Diagnosis not present

## 2016-10-02 DIAGNOSIS — R0602 Shortness of breath: Secondary | ICD-10-CM

## 2016-10-02 DIAGNOSIS — G4700 Insomnia, unspecified: Secondary | ICD-10-CM | POA: Diagnosis present

## 2016-10-02 DIAGNOSIS — I10 Essential (primary) hypertension: Secondary | ICD-10-CM | POA: Diagnosis present

## 2016-10-02 DIAGNOSIS — M25561 Pain in right knee: Secondary | ICD-10-CM

## 2016-10-02 DIAGNOSIS — J4541 Moderate persistent asthma with (acute) exacerbation: Secondary | ICD-10-CM | POA: Diagnosis present

## 2016-10-02 DIAGNOSIS — Z6841 Body Mass Index (BMI) 40.0 and over, adult: Secondary | ICD-10-CM | POA: Diagnosis not present

## 2016-10-02 DIAGNOSIS — Z79899 Other long term (current) drug therapy: Secondary | ICD-10-CM | POA: Diagnosis not present

## 2016-10-02 DIAGNOSIS — F329 Major depressive disorder, single episode, unspecified: Secondary | ICD-10-CM | POA: Diagnosis present

## 2016-10-02 DIAGNOSIS — E876 Hypokalemia: Secondary | ICD-10-CM | POA: Diagnosis present

## 2016-10-02 DIAGNOSIS — Z87891 Personal history of nicotine dependence: Secondary | ICD-10-CM | POA: Diagnosis not present

## 2016-10-02 DIAGNOSIS — J449 Chronic obstructive pulmonary disease, unspecified: Secondary | ICD-10-CM | POA: Diagnosis present

## 2016-10-02 DIAGNOSIS — F5101 Primary insomnia: Secondary | ICD-10-CM

## 2016-10-02 DIAGNOSIS — Z9989 Dependence on other enabling machines and devices: Secondary | ICD-10-CM

## 2016-10-02 DIAGNOSIS — M25562 Pain in left knee: Secondary | ICD-10-CM

## 2016-10-02 DIAGNOSIS — Z79891 Long term (current) use of opiate analgesic: Secondary | ICD-10-CM | POA: Diagnosis not present

## 2016-10-02 DIAGNOSIS — F339 Major depressive disorder, recurrent, unspecified: Secondary | ICD-10-CM | POA: Diagnosis present

## 2016-10-02 DIAGNOSIS — F411 Generalized anxiety disorder: Secondary | ICD-10-CM | POA: Diagnosis present

## 2016-10-02 DIAGNOSIS — D509 Iron deficiency anemia, unspecified: Secondary | ICD-10-CM | POA: Diagnosis present

## 2016-10-02 LAB — CBC WITH DIFFERENTIAL/PLATELET
BASOS ABS: 0 10*3/uL (ref 0.0–0.1)
BASOS PCT: 0 %
EOS ABS: 0 10*3/uL (ref 0.0–0.7)
Eosinophils Relative: 0 %
HEMATOCRIT: 37.5 % (ref 36.0–46.0)
HEMOGLOBIN: 11.7 g/dL — AB (ref 12.0–15.0)
Lymphocytes Relative: 9 %
Lymphs Abs: 2.1 10*3/uL (ref 0.7–4.0)
MCH: 26.4 pg (ref 26.0–34.0)
MCHC: 31.2 g/dL (ref 30.0–36.0)
MCV: 84.7 fL (ref 78.0–100.0)
MONO ABS: 1.9 10*3/uL — AB (ref 0.1–1.0)
MONOS PCT: 9 %
NEUTROS ABS: 18.4 10*3/uL — AB (ref 1.7–7.7)
NEUTROS PCT: 82 %
Platelets: 363 10*3/uL (ref 150–400)
RBC: 4.43 MIL/uL (ref 3.87–5.11)
RDW: 18.5 % — AB (ref 11.5–15.5)
WBC: 22.5 10*3/uL — ABNORMAL HIGH (ref 4.0–10.5)

## 2016-10-02 LAB — BASIC METABOLIC PANEL
ANION GAP: 14 (ref 5–15)
BUN: 7 mg/dL (ref 6–20)
CALCIUM: 8.2 mg/dL — AB (ref 8.9–10.3)
CO2: 21 mmol/L — AB (ref 22–32)
CREATININE: 0.91 mg/dL (ref 0.44–1.00)
Chloride: 104 mmol/L (ref 101–111)
GFR calc non Af Amer: >60 mL/min (ref >60)
Glucose, Bld: 145 mg/dL — ABNORMAL HIGH (ref 65–99)
Potassium: 3.1 mmol/L — ABNORMAL LOW (ref 3.5–5.1)
SODIUM: 139 mmol/L (ref 135–145)

## 2016-10-02 LAB — GLUCOSE, CAPILLARY
GLUCOSE-CAPILLARY: 140 mg/dL — AB (ref 65–99)
Glucose-Capillary: 260 mg/dL — ABNORMAL HIGH (ref 65–99)
Glucose-Capillary: 171 mg/dL — ABNORMAL HIGH (ref 65–99)
Glucose-Capillary: 113 mg/dL — ABNORMAL HIGH (ref 65–99)

## 2016-10-02 LAB — LACTIC ACID, PLASMA
LACTIC ACID, VENOUS: 5.7 mmol/L — AB (ref 0.5–1.9)
LACTIC ACID, VENOUS: 5.3 mmol/L — AB (ref 0.5–1.9)

## 2016-10-02 MED ORDER — INSULIN GLARGINE 100 UNIT/ML ~~LOC~~ SOLN: 20.0000 [IU] | Freq: Every day | SUBCUTANEOUS | Status: DC

## 2016-10-02 MED ORDER — NICOTINE 14 MG/24HR TD PT24
14.0000 mg | MEDICATED_PATCH | Freq: Every day | TRANSDERMAL | Status: DC
  Administered 2016-10-02 – 2016-10-04 (×3): 14 mg via TRANSDERMAL
  Filled 2016-10-02 (×3): qty 1

## 2016-10-02 MED ORDER — ALBUTEROL SULFATE (2.5 MG/3ML) 0.083% IN NEBU
INHALATION_SOLUTION | RESPIRATORY_TRACT | Status: AC
  Filled 2016-10-02: qty 3

## 2016-10-02 MED ORDER — PANTOPRAZOLE SODIUM 40 MG PO TBEC
80.0000 mg | DELAYED_RELEASE_TABLET | Freq: Every day | ORAL | Status: DC
  Administered 2016-10-02 – 2016-10-04 (×3): 80 mg via ORAL
  Filled 2016-10-02 (×3): qty 2

## 2016-10-02 MED ORDER — LEVALBUTEROL HCL 1.25 MG/0.5ML IN NEBU: 1.2500 mg | INHALATION_SOLUTION | Freq: Four times a day (QID) | RESPIRATORY_TRACT | Status: DC | PRN

## 2016-10-02 MED ORDER — METHYLPREDNISOLONE SODIUM SUCC 40 MG IJ SOLR
40.0000 mg | Freq: Two times a day (BID) | INTRAMUSCULAR | Status: DC
  Administered 2016-10-02 (×2): 40 mg via INTRAVENOUS
  Filled 2016-10-02 (×2): qty 1

## 2016-10-02 MED ORDER — ALBUTEROL SULFATE (2.5 MG/3ML) 0.083% IN NEBU: 2.5000 mg | INHALATION_SOLUTION | RESPIRATORY_TRACT | Status: DC | PRN

## 2016-10-02 MED ORDER — LEVALBUTEROL HCL 0.63 MG/3ML IN NEBU
0.6300 mg | INHALATION_SOLUTION | RESPIRATORY_TRACT | Status: DC
  Administered 2016-10-02 – 2016-10-04 (×13): 0.63 mg via RESPIRATORY_TRACT
  Filled 2016-10-02 (×14): qty 3

## 2016-10-02 MED ORDER — TRAZODONE HCL 50 MG PO TABS
50.0000 mg | ORAL_TABLET | Freq: Every day | ORAL | Status: DC
  Administered 2016-10-02 – 2016-10-03 (×2): 50 mg via ORAL
  Filled 2016-10-02 (×2): qty 1

## 2016-10-02 MED ORDER — MOMETASONE FURO-FORMOTEROL FUM 200-5 MCG/ACT IN AERO
1.0000 | INHALATION_SPRAY | Freq: Two times a day (BID) | RESPIRATORY_TRACT | Status: DC
  Administered 2016-10-02 – 2016-10-04 (×5): 1 via RESPIRATORY_TRACT
  Filled 2016-10-02: qty 8.8

## 2016-10-02 MED ORDER — INSULIN ASPART 100 UNIT/ML ~~LOC~~ SOLN: 0.0000 [IU] | Freq: Every day | SUBCUTANEOUS | Status: DC

## 2016-10-02 MED ORDER — ENOXAPARIN SODIUM 80 MG/0.8ML ~~LOC~~ SOLN
70.0000 mg | SUBCUTANEOUS | Status: DC
  Administered 2016-10-03: 70 mg via SUBCUTANEOUS
  Filled 2016-10-02: qty 0.8

## 2016-10-02 MED ORDER — ALBUTEROL SULFATE (2.5 MG/3ML) 0.083% IN NEBU
2.5000 mg | INHALATION_SOLUTION | Freq: Four times a day (QID) | RESPIRATORY_TRACT | Status: DC
  Administered 2016-10-02: 2.5 mg via RESPIRATORY_TRACT
  Filled 2016-10-02: qty 3

## 2016-10-02 MED ORDER — ALBUTEROL (5 MG/ML) CONTINUOUS INHALATION SOLN
10.0000 mg/h | INHALATION_SOLUTION | Freq: Once | RESPIRATORY_TRACT | Status: AC
  Administered 2016-10-02: 10 mg/h via RESPIRATORY_TRACT
  Filled 2016-10-02: qty 20

## 2016-10-02 MED ORDER — IPRATROPIUM BROMIDE 0.02 % IN SOLN
0.5000 mg | Freq: Two times a day (BID) | RESPIRATORY_TRACT | Status: DC
  Administered 2016-10-02 – 2016-10-04 (×6): 0.5 mg via RESPIRATORY_TRACT
  Filled 2016-10-02 (×5): qty 2.5

## 2016-10-02 MED ORDER — TRAMADOL HCL 50 MG PO TABS
50.0000 mg | ORAL_TABLET | Freq: Four times a day (QID) | ORAL | Status: DC | PRN
  Administered 2016-10-03: 100 mg via ORAL
  Filled 2016-10-02: qty 2

## 2016-10-02 MED ORDER — IOPAMIDOL (ISOVUE-370) INJECTION 76%
INTRAVENOUS | Status: AC
Start: 2016-10-02 — End: 2016-10-02
  Administered 2016-10-02: 100 mL
  Filled 2016-10-02: qty 100

## 2016-10-02 MED ORDER — POTASSIUM CHLORIDE IN NACL 20-0.9 MEQ/L-% IV SOLN
INTRAVENOUS | Status: DC
  Administered 2016-10-02 – 2016-10-03 (×2): 1000 mL via INTRAVENOUS
  Filled 2016-10-02 (×2): qty 1000

## 2016-10-02 MED ORDER — HYDRALAZINE HCL 20 MG/ML IJ SOLN: 10.0000 mg | Freq: Four times a day (QID) | INTRAMUSCULAR | Status: DC | PRN

## 2016-10-02 MED ORDER — ZOLPIDEM TARTRATE 5 MG PO TABS
5.0000 mg | ORAL_TABLET | Freq: Every evening | ORAL | Status: DC | PRN
  Administered 2016-10-02: 5 mg via ORAL
  Filled 2016-10-02: qty 1

## 2016-10-02 MED ORDER — DEXTROSE 5 % IV SOLN
500.0000 mg | INTRAVENOUS | Status: DC
  Administered 2016-10-02 – 2016-10-03 (×2): 500 mg via INTRAVENOUS
  Filled 2016-10-02 (×3): qty 500

## 2016-10-02 MED ORDER — SPIRONOLACTONE 25 MG PO TABS
50.0000 mg | ORAL_TABLET | Freq: Every day | ORAL | Status: DC
  Administered 2016-10-02 – 2016-10-04 (×4): 50 mg via ORAL
  Filled 2016-10-02 (×4): qty 2

## 2016-10-02 MED ORDER — SERTRALINE HCL 50 MG PO TABS
50.0000 mg | ORAL_TABLET | Freq: Every day | ORAL | Status: DC
  Administered 2016-10-02 – 2016-10-04 (×3): 50 mg via ORAL
  Filled 2016-10-02 (×3): qty 1

## 2016-10-02 MED ORDER — AMLODIPINE BESYLATE 10 MG PO TABS
10.0000 mg | ORAL_TABLET | Freq: Every day | ORAL | Status: DC
  Administered 2016-10-02 – 2016-10-04 (×4): 10 mg via ORAL
  Filled 2016-10-02 (×4): qty 1

## 2016-10-02 MED ORDER — METHYLPREDNISOLONE SODIUM SUCC 125 MG IJ SOLR
125.0000 mg | Freq: Once | INTRAMUSCULAR | Status: AC
  Administered 2016-10-02: 125 mg via INTRAVENOUS
  Filled 2016-10-02: qty 2

## 2016-10-02 MED ORDER — ALBUTEROL SULFATE (2.5 MG/3ML) 0.083% IN NEBU
2.5000 mg | INHALATION_SOLUTION | RESPIRATORY_TRACT | Status: DC | PRN
  Administered 2016-10-02 – 2016-10-04 (×2): 2.5 mg via RESPIRATORY_TRACT
  Filled 2016-10-02: qty 3

## 2016-10-02 MED ORDER — INSULIN GLARGINE 100 UNIT/ML ~~LOC~~ SOLN
25.0000 [IU] | Freq: Every day | SUBCUTANEOUS | Status: DC
  Administered 2016-10-03 – 2016-10-04 (×2): 25 [IU] via SUBCUTANEOUS
  Filled 2016-10-02 (×4): qty 0.25

## 2016-10-02 MED ORDER — INSULIN ASPART 100 UNIT/ML ~~LOC~~ SOLN
0.0000 [IU] | Freq: Three times a day (TID) | SUBCUTANEOUS | Status: DC
  Administered 2016-10-02: 11 [IU] via SUBCUTANEOUS
  Administered 2016-10-02: 4 [IU] via SUBCUTANEOUS
  Administered 2016-10-03 (×3): 3 [IU] via SUBCUTANEOUS
  Administered 2016-10-04 (×2): 4 [IU] via SUBCUTANEOUS

## 2016-10-02 MED ORDER — LORATADINE 10 MG PO TABS
10.0000 mg | ORAL_TABLET | Freq: Every day | ORAL | Status: DC
  Administered 2016-10-02 – 2016-10-04 (×3): 10 mg via ORAL
  Filled 2016-10-02 (×3): qty 1

## 2016-10-02 MED ORDER — FLUTICASONE PROPIONATE 50 MCG/ACT NA SUSP
2.0000 | Freq: Every day | NASAL | Status: DC
  Administered 2016-10-02 – 2016-10-04 (×2): 2 via NASAL
  Filled 2016-10-02: qty 16

## 2016-10-02 MED ORDER — MAGNESIUM SULFATE 2 GM/50ML IV SOLN
2.0000 g | Freq: Once | INTRAVENOUS | Status: AC
  Administered 2016-10-02: 2 g via INTRAVENOUS
  Filled 2016-10-02: qty 50

## 2016-10-02 MED ORDER — HYDRALAZINE HCL 20 MG/ML IJ SOLN
10.0000 mg | Freq: Once | INTRAMUSCULAR | Status: AC
  Administered 2016-10-02: 10 mg via INTRAVENOUS
  Filled 2016-10-02: qty 1

## 2016-10-02 MED ORDER — METHOCARBAMOL 500 MG PO TABS
500.0000 mg | ORAL_TABLET | Freq: Every day | ORAL | Status: DC
  Administered 2016-10-02 – 2016-10-04 (×3): 500 mg via ORAL
  Filled 2016-10-02 (×3): qty 1

## 2016-10-02 MED ORDER — ACETAMINOPHEN 500 MG PO TABS
1000.0000 mg | ORAL_TABLET | Freq: Four times a day (QID) | ORAL | Status: DC | PRN
  Administered 2016-10-02 – 2016-10-03 (×3): 1000 mg via ORAL
  Filled 2016-10-02 (×3): qty 2

## 2016-10-02 NOTE — Progress Notes (Addendum)
Patient seen and examined  46 y.o. female with a past medical history significant for asthma on Xolair, morbid obesity, NIDDM, OSA on CPAP who presents with dyspnea, chest tightness for 4 days failing outpatient therapy. Presented with asthma exacerbation  Plan Given her presentation of leukocytosis, lactic acid of 5.7, hypoxic respiratory failure requiring 3 L of oxygen, patient has been changed to inpatient CT chest negative for  PE/pneumonia,empiric abx due to severity of sx Wean oxygen Sliding scale insulin for hyperglycemia Hydralazine when necessary as needed for hypertensive urgency

## 2016-10-02 NOTE — ED Triage Notes (Signed)
Pt here for wheezing through out seen here yesterday for same, pt reports no relief with nebulizer.

## 2016-10-02 NOTE — ED Notes (Signed)
Pt ambulated to restroom. Pt stated she feels better but still having some difficulty breathing. Breathing tx not complete at this time.

## 2016-10-02 NOTE — ED Provider Notes (Signed)
[facility] DEPT Provider Note   CSN: [id number] Arrival date & time: 10/02/16  0034  By signing my name below, I, Reola Mosher, attest that this documentation has been prepared under the direction and in the presence of Beaumont Austad, Gwenyth Allegra, MD. Electronically Signed: Reola Mosher, ED Scribe. 10/02/16. 1:08 AM.  History   Chief Complaint Chief Complaint  Patient presents with  . Shortness of Breath  . Wheezing   The history is provided by the patient. No language interpreter was used.    HPI Comments: April [name] is a 46 y.o. female with a PMHx of asthma, obesity, COPD, and seasonal allergies, who presents to the Emergency Department complaining of persistent shortness of breath and wheezing beginning earlier this evening. Per pt, several of her children are sick and she has had a sore throat over the day; however, tonight she had an acute onset of shortness of breath and wheezing. She also notes that she has had sporadic shortness of breath which was worse with laying supine over the past several days; she was seen in the ED for this on 06/04 with workup including negative CXR. Pt was given breathing treatments at that time with resolution of her symptoms and d/c'd w/ a burst of Prednisone. She attempted using her nebulizer at home tonight for this recurrent episode without relief of her symptoms. She denies chest pain, or any other associated symptoms.   Past Medical History:  Diagnosis Date  . Acanthosis nigricans   . Arthritis   . Asthma    Followed by Dr. Annamaria Boots (pulmonology); receives every other week omalizumab injections; has frequent exacerbations  . COPD (chronic obstructive pulmonary disease) ([place])    PFTs in 2002, FEV1/FVC 65, no post bronchodilater test done  . Depression   . GERD (gastroesophageal reflux disease)   . Headache(784.0)   . Helicobacter pylori (H. pylori) infection   . Hypertension, essential   . Insomnia   . Menorrhagia   . Morbid  obesity ([place])   . Obesity   . Seasonal allergies   . Shortness of breath   . Sleep apnea    Sleep study 2008 - mild OSA, not enough events to titrate CPAP  . Tobacco user    Patient Active Problem List   Diagnosis Date Noted  . Hypertensive urgency, malignant 06/22/2016  . Pulmonary edema 06/22/2016  . Hypertensive urgency 06/22/2016  . 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 Lip laceration 05/05/2016  . Elevated hemoglobin A1c 05/05/2016  . Asthma exacerbation 11/10/2015  . GERD (gastroesophageal reflux disease) 08/30/2015  . Generalized anxiety disorder 08/30/2015  . Seasonal allergic rhinitis 08/29/2013  . Tobacco abuse 10/07/2012  . Asthma, chronic obstructive, without status asthmaticus ([place]) 05/07/2012  . Knee pain, bilateral 04/25/2011  . Primary insomnia 03/14/2011  . Mild obstructive sleep apnea 12/19/2010  . Hypokalemia 08/13/2010  . Cervical back pain with evidence of disc disease 04/08/2008  . Essential hypertension 07/31/2006  . Morbid obesity with body mass index of 50.0-59.9 in adult ([place]) 06/17/2006  . Major depressive disorder, recurrent episode ([place]) 04/10/2006   Past Surgical History:  Procedure Laterality Date  . BREAST REDUCTION SURGERY  09/2011  . TUBAL LIGATION  1996   bilateral   OB History    No data available     Home Medications    Prior to Admission medications   Medication Sig Start Date End Date Taking? Authorizing Provider  acetaminophen (TYLENOL) 500 MG tablet Take 2 tablets (1,000  mg total) by mouth every 6 (six) hours as needed. Patient taking differently: Take 1,000 mg by mouth every 6 (six) hours as needed for mild pain.  08/30/15  Yes [name], MD  albuterol [facility] HFA) [phone] Base) MCG/ACT inhaler Inhale 1-2 puffs into the lungs every 6 (six) hours as needed for wheezing or shortness of breath. 07/29/16  Yes Young, Tarri Fuller D, MD  albuterol (PROVENTIL) (2.5 MG/3ML) 0.083% nebulizer solution Take 3  mLs (2.5 mg total) by nebulization every 4 (four) hours as needed for wheezing or shortness of breath. 06/06/16  Yes Young, Tarri Fuller D, MD  amLODipine (NORVASC) 10 MG tablet Take 1 tablet (10 mg total) by mouth daily. 05/03/16  Yes Funches, Adriana Mccallum, MD  diclofenac (VOLTAREN) 75 MG EC tablet Take 1 tablet (75 mg total) by mouth 2 (two) times daily. 08/05/16  Yes Leandrew Koyanagi, MD  esomeprazole (NEXIUM) 40 MG capsule Take 1 capsule (40 mg total) by mouth daily. 06/10/16  Yes Young, Tarri Fuller D, MD  fluticasone (FLONASE) 50 MCG/ACT nasal spray Place 2 sprays into both nostrils daily. 12/27/15  Yes Elsie Stain, MD  guaiFENesin (MUCINEX) 600 MG 12 hr tablet Take 2 tablets (1,200 mg total) by mouth 2 (two) times daily. Patient taking differently: Take 1,200 mg by mouth 2 (two) times daily as needed for cough.  05/07/16  Yes Vann, Jessica U, DO  ipratropium (ATROVENT) 0.02 % nebulizer solution Take 2.5 mLs (0.5 mg total) by nebulization 4 (four) times daily as needed for wheezing or shortness of breath. 06/06/16  Yes Young, Tarri Fuller D, MD  loratadine (CLARITIN) 10 MG tablet Take 1 tablet (10 mg total) by mouth daily. 08/30/15  Yes [name], MD  metFORMIN (GLUCOPHAGE) 500 MG tablet Take 1 tablet (500 mg total) by mouth 2 (two) times daily with a meal. Patient taking differently: Take 1,000 mg by mouth daily.  05/07/16  Yes Geradine Girt, DO  methocarbamol (ROBAXIN) 500 MG tablet Take 1 tablet (500 mg total) by mouth 2 (two) times daily. Patient taking differently: Take 500 mg by mouth daily.  07/15/16  Yes Hedges, Dellis Filbert, PA-C  mometasone-formoterol (DULERA) 200-5 MCG/ACT AERO Inhale 2 puffs into the lungs 2 (two) times daily. Patient taking differently: Inhale 1 puff into the lungs 2 (two) times daily.  06/06/16  Yes Young, Tarri Fuller D, MD  OVER THE COUNTER MEDICATION Place 1 patch onto the skin as needed (Nicotine therapy).   Yes [provider]  predniSONE (STERAPRED UNI-PAK 21 TAB) 10 MG (21) TBPK tablet  Take by mouth daily. Take 6 tabs by mouth daily  for 2 days, then 5 tabs for 2 days, then 4 tabs for 2 days, then 3 tabs for 2 days, 2 tabs for 2 days, then 1 tab by mouth daily for 2 days 09/30/16  Yes Pattricia Boss, MD  sertraline (ZOLOFT) 50 MG tablet Take 1 tablet (50 mg total) by mouth daily. 08/30/15  Yes [name], MD  spironolactone (ALDACTONE) 50 MG tablet Take 2 tablets (100 mg total) by mouth daily. Patient taking differently: Take 50 mg by mouth daily.  05/21/16  Yes Funches, Josalyn, MD  traMADol (ULTRAM) 50 MG tablet Take 1 tablet (50 mg total) by mouth every 6 (six) hours as needed. Patient taking differently: Take 50-100 mg by mouth every 6 (six) hours as needed (pain).  09/08/16  Yes Larene Pickett, PA-C  traZODone (DESYREL) 50 MG tablet Take 0.5-1 tablets (25-50 mg total) by mouth at bedtime as needed  for sleep. Patient taking differently: Take 50 mg by mouth at bedtime.  05/21/16  Yes Funches, Josalyn, MD  benzonatate (TESSALON) 100 MG capsule Take 1 capsule (100 mg total) by mouth 3 (three) times daily as needed for cough. Patient not taking: Reported on 10/02/2016 05/07/16   Geradine Girt, DO  clindamycin (CLEOCIN) 150 MG capsule Take 2 capsules (300 mg total) by mouth 3 (three) times daily. May dispense as 150mg  capsules Patient not taking: Reported on 09/30/2016 09/08/16   Larene Pickett, PA-C  lisinopril (PRINIVIL,ZESTRIL) 40 MG tablet Take 1 tablet (40 mg total) by mouth daily. Patient not taking: Reported on 09/30/2016 06/25/16   Jonetta Osgood, MD  PROAIR HFA [phone] Base) MCG/ACT inhaler INHALE 1-2 PUFFS INTO THELUNGS EVERY 6 HOURS AS NEEDED FOR WHEEZING OR SHORTNESS OF BREATH Patient not taking: Reported on 09/30/2016 08/16/16   Deneise Lever, MD   Family History Family History  Problem Relation Age of Onset  . Hypertension Mother   . Asthma Daughter   . Cancer Paternal Aunt   . Asthma Maternal Grandmother    Social History Social History  Substance Use Topics  .  Smoking status: Former Smoker    Packs/day: 0.50    Years: 18.00    Types: Cigarettes    Quit date: 04/29/2014  . Smokeless tobacco: Never Used  . Alcohol use No   Allergies   Patient has no known allergies.  Review of Systems Review of Systems  Respiratory: Positive for shortness of breath and wheezing.   Cardiovascular: Negative for chest pain.  All other systems reviewed and are negative.  Physical Exam Updated Vital Signs BP (!) 176/117   Pulse (!) 125   Temp 98.6 F (37 C) (Oral)   Resp (!) 26   LMP 09/28/2016 (Exact Date)   SpO2 98%   Physical Exam  Constitutional: She is oriented to person, place, and time. She appears well-developed and well-nourished. No distress.  HENT:  Head: Normocephalic and atraumatic.  Right Ear: Hearing normal.  Left Ear: Hearing normal.  Nose: Nose normal.  Mouth/Throat: Oropharynx is clear and moist and mucous membranes are normal.  Eyes: Conjunctivae and EOM are normal. Pupils are equal, round, and reactive to light.  Neck: Normal range of motion. Neck supple.  Cardiovascular: Regular rhythm, S1 normal and S2 normal.  Exam reveals no gallop and no friction rub.   No murmur heard. Pulmonary/Chest: Tachypnea noted. She has decreased breath sounds. She exhibits no tenderness.  Pt is tachypnic. There is increased WOB. Diffuse wheezing and decreased breath sounds b/l.   Abdominal: Soft. Normal appearance and bowel sounds are normal. There is no hepatosplenomegaly. There is no tenderness. There is no rebound, no guarding, no tenderness at McBurney's point and negative Murphy's sign. No hernia.  Musculoskeletal: Normal range of motion.  Neurological: She is alert and oriented to person, place, and time. She has normal strength. No cranial nerve deficit or sensory deficit. Coordination normal. GCS eye subscore is 4. GCS verbal subscore is 5. GCS motor subscore is 6.  Skin: Skin is warm, dry and intact. No rash noted. No cyanosis.  Psychiatric:  She has a normal mood and affect. Her speech is normal and behavior is normal. Thought content normal.  Nursing note and vitals reviewed.  ED Treatments / Results  DIAGNOSTIC STUDIES: Oxygen Saturation is 97% on RA, normal by my interpretation.   COORDINATION OF CARE: 1:08 AM-Discussed next steps with pt. Pt verbalized understanding and is agreeable  with the plan.   Labs (all labs ordered are listed, but only abnormal results are displayed) Labs Reviewed  CBC WITH DIFFERENTIAL/PLATELET - Abnormal; Notable for the following:       Result Value   WBC 22.5 (*)    Hemoglobin 11.7 (*)    RDW 18.5 (*)    Neutro Abs 18.4 (*)    Monocytes Absolute 1.9 (*)    All other components within normal limits  BASIC METABOLIC PANEL - Abnormal; Notable for the following:    Potassium 3.1 (*)    CO2 21 (*)    Glucose, Bld 145 (*)    Calcium 8.2 (*)    All other components within normal limits    EKG  EKG Interpretation None      Radiology Dg Chest 2 View  Result Date: 09/30/2016 CLINICAL DATA:  Worsening asthma EXAM: CHEST  2 VIEW COMPARISON:  06/21/2016 FINDINGS: The heart size and mediastinal contours are within normal limits. Both lungs are clear. The visualized skeletal structures are unremarkable. IMPRESSION: No active cardiopulmonary disease. Electronically Signed   By: Kerby Moors M.D.   On: 09/30/2016 13:18   Procedures Procedures   Medications Ordered in ED Medications  hydrALAZINE (APRESOLINE) injection 10 mg (not administered)  methylPREDNISolone sodium succinate (SOLU-MEDROL) 125 mg/2 mL injection 125 mg (125 mg Intravenous Given 10/02/16 0206)  magnesium sulfate IVPB 2 g 50 mL (0 g Intravenous Stopped 10/02/16 0308)  albuterol (PROVENTIL,VENTOLIN) solution continuous neb (10 mg/hr Nebulization Given 10/02/16 0155)    Initial Impression / Assessment and Plan / ED Course  I have reviewed the triage vital signs and the nursing notes.  Pertinent labs & imaging results that  were available during my care of the patient were reviewed by me and considered in my medical decision making (see chart for details).     Patient presents to the ER for evaluation of difficulty breathing. She has a history of asthma. Patient was seen in the ER yesterday with similar symptoms, treated with bronchodilators and prednisone, discharged. She continues to worsen. At arrival to the ER she has significant bronchospasm and mild respiratory distress. Patient administered Solu-Medrol, magnesium, continuous albuterol therapy with only slight improvement. She is not hypoxic, but still dyspneic at rest. She will require hospitalization for further management of persistent asthma with exacerbation.  Patient also hypertensive. She does have a history of significant hypertension. She is asymptomatic. Lab work unremarkable. Patient was given hydralazine here in the ER for her elevated blood pressure.  Final Clinical Impressions(s) / ED Diagnoses   Final diagnoses:  Essential hypertension  Moderate persistent asthma with exacerbation   New Prescriptions New Prescriptions   No medications on file   I personally performed the services described in this documentation, which was scribed in my presence. The recorded information has been reviewed and is accurate.     Orpah Greek, MD 10/02/16 [id number]

## 2016-10-02 NOTE — H&P (Signed)
History and Physical  Patient Name: April [name]     ZHG:[id number]    DOB: 1971/01/24    DOA: 10/02/2016 PCP: Boykin Nearing, MD   Patient coming from: Home  Chief Complaint: Dyspnea  HPI: April [name] is a 46 y.o. female with a past medical history significant for asthma on Xolair, morbid obesity, NIDDM, OSA on CPAP who presents with dyspnea, chest tightness for 4 days failing outpatient therapy.  The patient was in her usual state of health until about last Friday when she was around grandchildren who all had upper respiratory infections, and afterwards she started to have chest tightness, wheezing, shortness of breath. She presented to the emergency room where she was treated with IV steroids, nebulized bronchodilators, felt better and went home with prednisone. She took a dose of prednisone today, but despite that and use of her home nebulizers, throughout the day she continued to feel dyspneic, so she came back to the emergency room. No fever, chills, chest pain, sputum production.  ED course: -Afebrile, heart rate is 20, respirations 26, pulse 97% on 2 L nasal cannula blood pressure 160/119 -Na 139, K 3.1, Cr 0.91, WBC 22.5K, Hgb 11.7 -She was given Solu-Medrol, magnesium, and a continuous albuterol neb and still felt dyspneic with any exertion, so TRH were asked to evaluate for asthma flare     ROS: Review of Systems  Constitutional: Negative for chills, fever and malaise/fatigue.  Respiratory: Positive for shortness of breath and wheezing. Negative for cough, hemoptysis and sputum production.   Cardiovascular: Negative for chest pain.  All other systems reviewed and are negative.         Past Medical History:  Diagnosis Date  . Acanthosis nigricans   . Arthritis   . Asthma    Followed by Dr. Annamaria Boots (pulmonology); receives every other week omalizumab injections; has frequent exacerbations  . COPD (chronic obstructive pulmonary disease) ([place])    PFTs in 2002, FEV1/FVC 65,  no post bronchodilater test done  . Depression   . GERD (gastroesophageal reflux disease)   . Headache(784.0)   . Helicobacter pylori (H. pylori) infection   . Hypertension, essential   . Insomnia   . Menorrhagia   . Morbid obesity ([place])   . Obesity   . Seasonal allergies   . Shortness of breath   . Sleep apnea    Sleep study 2008 - mild OSA, not enough events to titrate CPAP  . Tobacco user     Past Surgical History:  Procedure Laterality Date  . BREAST REDUCTION SURGERY  09/2011  . TUBAL LIGATION  1996   bilateral    Social History:  Patient lives with her children.  The patient walks unassisted.  Former smoker.  No Known Allergies  Family history: family history includes Asthma in her daughter and maternal grandmother; Cancer in her paternal aunt; Hypertension in her mother.  Prior to Admission medications   Medication Sig Start Date End Date Taking? Authorizing Provider  acetaminophen (TYLENOL) 500 MG tablet Take 2 tablets (1,000 mg total) by mouth every 6 (six) hours as needed. Patient taking differently: Take 1,000 mg by mouth every 6 (six) hours as needed for mild pain.  08/30/15  Yes [name], MD  albuterol [facility] HFA) [phone] Base) MCG/ACT inhaler Inhale 1-2 puffs into the lungs every 6 (six) hours as needed for wheezing or shortness of breath. 07/29/16  Yes Young, Tarri Fuller D, MD  albuterol (PROVENTIL) (2.5 MG/3ML) 0.083% nebulizer solution Take 3 mLs (2.5  mg total) by nebulization every 4 (four) hours as needed for wheezing or shortness of breath. 06/06/16  Yes Young, Tarri Fuller D, MD  amLODipine (NORVASC) 10 MG tablet Take 1 tablet (10 mg total) by mouth daily. 05/03/16  Yes Funches, Josalyn, MD  esomeprazole (NEXIUM) 40 MG capsule Take 1 capsule (40 mg total) by mouth daily. 06/10/16  Yes Young, Tarri Fuller D, MD  fluticasone (FLONASE) 50 MCG/ACT nasal spray Place 2 sprays into both nostrils daily. 12/27/15  Yes Elsie Stain, MD  guaiFENesin (MUCINEX) 600 MG 12 hr tablet  Take 2 tablets (1,200 mg total) by mouth 2 (two) times daily. Patient taking differently: Take 1,200 mg by mouth 2 (two) times daily as needed for cough.  05/07/16  Yes Vann, Jessica U, DO  ipratropium (ATROVENT) 0.02 % nebulizer solution Take 2.5 mLs (0.5 mg total) by nebulization 4 (four) times daily as needed for wheezing or shortness of breath. 06/06/16  Yes Young, Tarri Fuller D, MD  loratadine (CLARITIN) 10 MG tablet Take 1 tablet (10 mg total) by mouth daily. 08/30/15  Yes [name], MD  metFORMIN (GLUCOPHAGE) 500 MG tablet Take 1 tablet (500 mg total) by mouth 2 (two) times daily with a meal. Patient taking differently: Take 1,000 mg by mouth daily.  05/07/16  Yes Geradine Girt, DO  methocarbamol (ROBAXIN) 500 MG tablet Take 1 tablet (500 mg total) by mouth 2 (two) times daily. Patient taking differently: Take 500 mg by mouth daily.  07/15/16  Yes Hedges, Dellis Filbert, PA-C  mometasone-formoterol (DULERA) 200-5 MCG/ACT AERO Inhale 2 puffs into the lungs 2 (two) times daily. Patient taking differently: Inhale 1 puff into the lungs 2 (two) times daily.  06/06/16  Yes Young, Tarri Fuller D, MD  OVER THE COUNTER MEDICATION Place 1 patch onto the skin as needed (Nicotine therapy).   Yes [provider]  sertraline (ZOLOFT) 50 MG tablet Take 1 tablet (50 mg total) by mouth daily. 08/30/15  Yes [name], MD  spironolactone (ALDACTONE) 50 MG tablet Take 2 tablets (100 mg total) by mouth daily. Patient taking differently: Take 50 mg by mouth daily.  05/21/16  Yes Funches, Josalyn, MD  traMADol (ULTRAM) 50 MG tablet Take 1 tablet (50 mg total) by mouth every 6 (six) hours as needed. Patient taking differently: Take 50-100 mg by mouth every 6 (six) hours as needed (pain).  09/08/16  Yes Larene Pickett, PA-C  traZODone (DESYREL) 50 MG tablet Take 0.5-1 tablets (25-50 mg total) by mouth at bedtime as needed for sleep. Patient taking differently: Take 50 mg by mouth at bedtime.  05/21/16  Yes Boykin Nearing, MD        Physical Exam: BP (!) 191/99 (BP Location: Left Arm)   Pulse (!) 118   Temp 98.5 F (36.9 C)   Resp (!) 24   Ht 5\' 6"  (1.676 m)   Wt (!) 153.7 kg (338 lb 14.4 oz)   LMP 09/28/2016 (Exact Date)   SpO2 100%   BMI 54.70 kg/m  General appearance: Well-developed, obese adult female, alert and in mild distress from dyspnea.   Eyes: Anicteric, conjunctiva pink, lids and lashes normal. PERRL.    ENT: No nasal deformity, discharge, epistaxis.  Hearing normal. OP moist without lesions.   Lymph: unable to assess given habitus Skin: Warm and dry.  No suspicious rashes or lesions. Cardiac: Tachycardic, regular, nl S1-S2, no murmurs appreciated.  Capillary refill is brisk.  JVP not visible.  No LE edema.  Radial pulses 2+ and symmetric.  Respiratory: Snoring with sleep.  Breathing fast and labored.  Coarse wheezes with expiration. Abdomen: Abdomen soft.  No TTP. No ascites, distension, hepatosplenomegaly.   MSK: No deformities or effusions.  No cyanosis or clubbing. Neuro: Cranial nerves normal.  Sensation intact to light touch. Speech is fluent.  Muscle strength normal.    Psych: Sensorium intact and responding to questions, attention normal.  Behavior appropriate.  Affect normal.  Judgment and insight appear normal.     Labs on Admission:  I have personally reviewed following labs and imaging studies: CBC:  Recent Labs Lab 10/02/16 0131  WBC 22.5*  NEUTROABS 18.4*  HGB 11.7*  HCT 37.5  MCV 84.7  PLT 160   Basic Metabolic Panel:  Recent Labs Lab 10/02/16 0131  NA 139  K 3.1*  CL 104  CO2 21*  GLUCOSE 145*  BUN 7  CREATININE 0.91  CALCIUM 8.2*   GFR: Estimated Creatinine Clearance: 122.2 mL/min (by C-G formula based on SCr of 0.91 mg/dL).  Liver Function Tests: No results for input(s): AST, ALT, ALKPHOS, BILITOT, PROT, ALBUMIN in the last 168 hours. No results for input(s): LIPASE, AMYLASE in the last 168 hours. No results for input(s): AMMONIA in the last 168  hours. Coagulation Profile: No results for input(s): INR, PROTIME in the last 168 hours. Cardiac Enzymes: No results for input(s): CKTOTAL, CKMB, CKMBINDEX, TROPONINI in the last 168 hours. BNP (last 3 results) No results for input(s): PROBNP in the last 8760 hours. HbA1C: No results for input(s): HGBA1C in the last 72 hours. CBG: No results for input(s): GLUCAP in the last 168 hours. Lipid Profile: No results for input(s): CHOL, HDL, LDLCALC, TRIG, CHOLHDL, LDLDIRECT in the last 72 hours. Thyroid Function Tests: No results for input(s): TSH, T4TOTAL, FREET4, T3FREE, THYROIDAB in the last 72 hours. Anemia Panel: No results for input(s): VITAMINB12, FOLATE, FERRITIN, TIBC, IRON, RETICCTPCT in the last 72 hours. Sepsis Labs:  Invalid input(s): PROCALCITONIN, LACTICIDVEN No results found for this or any previous visit (from the past 240 hour(s)).       Radiological Exams on Admission: Personally reviewed CXR from two days ago shows no focal opacity or pneumonia: Dg Chest 2 View  Result Date: 09/30/2016 CLINICAL DATA:  Worsening asthma EXAM: CHEST  2 VIEW COMPARISON:  06/21/2016 FINDINGS: The heart size and mediastinal contours are within normal limits. Both lungs are clear. The visualized skeletal structures are unremarkable. IMPRESSION: No active cardiopulmonary disease. Electronically Signed   By: Kerby Moors M.D.   On: 09/30/2016 13:18          Assessment/Plan Principal Problem:   Asthma exacerbation Active Problems:   Morbid obesity with body mass index of 50.0-59.9 in adult [facility])   Major depressive disorder, recurrent episode ([place])   Essential hypertension   Hypokalemia   Mild obstructive sleep apnea   Type 2 diabetes mellitus without complication, without long-term current use of insulin (HCC)   Normocytic anemia  1. Asthma exacerbation:    -Solu-Medrol 40 mg IV twice a day -Advised albuterol every 6 hours when necessary -Continue loratadine and  Flonase -Continue Dulera -Stop diclofenac, avoid NSAIDs  2. Hypokalemia:  From albuterol -Trend BMP  3. Anemia:  Chronic normocytic anemia, stable  4. Leukocytosis:  Doubt infection, suspect this is reactive -Lactate per Epic decision support, suspect elevation would be from asthma/albuterol not infection  5. Type 2 diabetes:  -Hold metformin -SSI with meals  6. OSA:  -Continue CPAP  7. Hypertension:  Hypertensive at admission. -Continue amlodipine  and aldactone          DVT prophylaxis: Lovenox  Code Status: FULL  Family Communication: None present  Disposition Plan: Anticipate IV steroids, continued nebulized bronchodilators.   Consults called: None Admission status: OBS At the point of initial evaluation, it is my clinical opinion that admission for OBSERVATION is reasonable and necessary because the patient's presenting complaints in the context of their chronic conditions represent sufficient risk of deterioration or significant morbidity to constitute reasonable grounds for close observation in the hospital setting, but that the patient may be medically stable for discharge from the hospital within 24 to 48 hours.    Medical decision making: Patient seen at 4:20 AM on 10/02/2016.  The patient was discussed with Dr. Betsey Holiday.  What exists of the patient's chart was reviewed in depth and summarized above.  Clinical condition: stable.        [name] [facility] Pager [phone]

## 2016-10-02 NOTE — ED Notes (Signed)
Spoke with Dr Loleta Books about BP.

## 2016-10-02 NOTE — Progress Notes (Signed)
Pt. needs a breathing tx., respiratory said her prn's and treatments has been discontinued.  Text paged Dr. Allyson Sabal to see if she can have them back.  Will continue to monitor and await for new orders or return call.  Markus Jarvis, LPN/ Alphonzo Lemmings, RN

## 2016-10-02 NOTE — Progress Notes (Signed)
Patient arrived to the unit via bed from the emergency department.  Patient is alert and oriented x 4.  No complaints of pain. Patient refused a skin assessment. IV intact to the right AC.  Vital signs: BP: 191/99 Pulse: 118  Temp : 98.5 Resp: 24 and 100% on nasal cannula .  Educated the patient on how to reach the staff on the unit.  Will administer patient daily  blood pressure meds.  Will continue to monitor the patient

## 2016-10-02 NOTE — Progress Notes (Signed)
Pt has history of asthma/COPD. Presenting today with SOB and wheezing. Pt is very Bronchiospasmtic at this time. BBS faint inspiratory wheezing and coarse expiratory wheezing noted throughout. respirations in the upper 20's. Non-productive cough noted. No significant respiratory compromise noted a this time. CAT STARTED

## 2016-10-02 NOTE — Progress Notes (Signed)
CRITICAL VALUE ALERT  Critical Value:  Lactic acid 5.7  Date & Time Notied:  10/02/16 @ 0900  Provider Notified: Text paged Dr. Allyson Sabal.  Orders Received/Actions taken: MD  Already aware, new orders for IV antibiotics.  Will continue to monitor.  Alphonzo Lemmings, RN

## 2016-10-02 NOTE — ED Notes (Signed)
Pt sats 65-70% on room air when sleeping. Pt placed on 2L [place], sats improved to 95%

## 2016-10-02 NOTE — Progress Notes (Signed)
SATURATION QUALIFICATIONS: (This note is used to comply with regulatory documentation for home oxygen)  Patient Saturations on Room Air at Rest = 93%  Patient Saturations on Room Air while Ambulating = 91%  Patient Saturations on 4 Liters of oxygen while Ambulating = 93%  Please briefly explain why patient needs home oxygen:  Melonie Florida, LPN 05/05/7114

## 2016-10-03 ENCOUNTER — Inpatient Hospital Stay (HOSPITAL_COMMUNITY): Payer: Medicaid Other

## 2016-10-03 DIAGNOSIS — D649 Anemia, unspecified: Secondary | ICD-10-CM

## 2016-10-03 DIAGNOSIS — I517 Cardiomegaly: Secondary | ICD-10-CM

## 2016-10-03 LAB — CBC
HCT: 38.6 % (ref 36.0–46.0)
Hemoglobin: 11.9 g/dL — ABNORMAL LOW (ref 12.0–15.0)
MCH: 26.3 pg (ref 26.0–34.0)
MCHC: 30.8 g/dL (ref 30.0–36.0)
MCV: 85.4 fL (ref 78.0–100.0)
PLATELETS: 359 10*3/uL (ref 150–400)
RBC: 4.52 MIL/uL (ref 3.87–5.11)
RDW: 18.9 % — AB (ref 11.5–15.5)
WBC: 23.4 10*3/uL — ABNORMAL HIGH (ref 4.0–10.5)

## 2016-10-03 LAB — ECHOCARDIOGRAM COMPLETE
Height: 66 in
WEIGHTICAEL: 5422.4 [oz_av]

## 2016-10-03 LAB — GLUCOSE, CAPILLARY
GLUCOSE-CAPILLARY: 136 mg/dL — AB (ref 65–99)
GLUCOSE-CAPILLARY: 148 mg/dL — AB (ref 65–99)
Glucose-Capillary: 137 mg/dL — ABNORMAL HIGH (ref 65–99)
Glucose-Capillary: 152 mg/dL — ABNORMAL HIGH (ref 65–99)

## 2016-10-03 LAB — COMPREHENSIVE METABOLIC PANEL
ALBUMIN: 3.1 g/dL — AB (ref 3.5–5.0)
ALK PHOS: 46 U/L (ref 38–126)
ALT: 27 U/L (ref 14–54)
ANION GAP: 11 (ref 5–15)
AST: 29 U/L (ref 15–41)
BILIRUBIN TOTAL: 0.6 mg/dL (ref 0.3–1.2)
BUN: 8 mg/dL (ref 6–20)
CALCIUM: 8.3 mg/dL — AB (ref 8.9–10.3)
CO2: 26 mmol/L (ref 22–32)
Chloride: 103 mmol/L (ref 101–111)
Creatinine, Ser: 0.84 mg/dL (ref 0.44–1.00)
GFR calc Af Amer: >60 mL/min (ref >60)
GFR calc non Af Amer: >60 mL/min (ref >60)
GLUCOSE: 177 mg/dL — AB (ref 65–99)
Potassium: 3.5 mmol/L (ref 3.5–5.1)
Sodium: 140 mmol/L (ref 135–145)
TOTAL PROTEIN: 5.7 g/dL — AB (ref 6.5–8.1)

## 2016-10-03 MED ORDER — ZOLPIDEM TARTRATE 5 MG PO TABS
10.0000 mg | ORAL_TABLET | Freq: Every evening | ORAL | Status: DC | PRN
  Administered 2016-10-03: 10 mg via ORAL
  Filled 2016-10-03: qty 2

## 2016-10-03 MED ORDER — ZOLPIDEM TARTRATE 5 MG PO TABS: 5.0000 mg | ORAL_TABLET | Freq: Every evening | ORAL | Status: DC | PRN

## 2016-10-03 MED ORDER — FUROSEMIDE 10 MG/ML IJ SOLN
60.0000 mg | Freq: Once | INTRAMUSCULAR | Status: AC
  Administered 2016-10-03: 60 mg via INTRAVENOUS
  Filled 2016-10-03: qty 6

## 2016-10-03 MED ORDER — METHYLPREDNISOLONE SODIUM SUCC 125 MG IJ SOLR
80.0000 mg | Freq: Two times a day (BID) | INTRAMUSCULAR | Status: DC
  Administered 2016-10-03 (×2): 80 mg via INTRAVENOUS
  Filled 2016-10-03 (×3): qty 2

## 2016-10-03 MED ORDER — PERFLUTREN LIPID MICROSPHERE
1.0000 mL | INTRAVENOUS | Status: AC | PRN
Start: 2016-10-03 — End: 2016-10-03
  Administered 2016-10-03: 3 mL via INTRAVENOUS
  Filled 2016-10-03: qty 10

## 2016-10-03 MED ORDER — HYDROCODONE-ACETAMINOPHEN 5-325 MG PO TABS
1.0000 | ORAL_TABLET | Freq: Four times a day (QID) | ORAL | Status: DC | PRN
  Administered 2016-10-03 – 2016-10-04 (×4): 2 via ORAL
  Filled 2016-10-03 (×4): qty 2

## 2016-10-03 NOTE — Progress Notes (Signed)
[facility] PROGRESS NOTE  April [name] JOA:[id number] DOB: 1970/08/04 DOA: 10/02/2016   PCP: Boykin Nearing, MD     Assessment/Plan: Principal Problem:   Asthma exacerbation Active Problems:   Morbid obesity with body mass index of 50.0-59.9 in adult [facility])   Major depressive disorder, recurrent episode ([place])   Essential hypertension   Hypokalemia   Mild obstructive sleep apnea   Type 2 diabetes mellitus without complication, without long-term current use of insulin (HCC)   Normocytic anemia   Respiratory failure ([place])   46 y.o.femalewith a past medical history significant for asthma on Xolair, morbid obesity, NIDDM, OSA on CPAPwho presents with dyspnea, chest tightness for 4 days failing outpatient therapy. Presented with asthma exacerbation  Assessment and plan  1. Moderate to severe Asthma exacerbation:    -Increase Solu-Medrol to 80 mg IV twice a day due to continued wheezing -Advised albuterol every 6 hours when necessary -Continue loratadine and Flonase -Continue Dulera -Empiric antibiotics due to severity of exacerbation CT negative for large PE, small PE cannot be excluded   2. Hypokalemia:  Repleted  3. Anemia:  Chronic normocytic anemia, stable, baseline hemoglobin 11.9, stable  4. Leukocytosis:  Could be secondary to steroids, follow  5. Type 2 diabetes:accuchecks stable   -Hold metformin, on lantus 25 units  -SSI with meals  6. OSA:  -Continue CPAP  7. Hypertension:  Hypertensive at admission. -Continue amlodipine and aldactone  8.insomnia  Requesting ambien   DVT prophylaxsis Lovenox  Code Status:  Full code   Family Communication: Discussed in detail with the patient, all imaging results, lab results explained to the patient   Disposition Plan:  1-2 days     Consultants:  None  Procedures:  None  Antibiotics: Anti-infectives    Start     Dose/Rate Route Frequency Ordered Stop   10/02/16 1100   azithromycin (ZITHROMAX) 500 mg in dextrose 5 % 250 mL IVPB     500 mg 250 mL/hr over 60 Minutes Intravenous Every 24 hours 10/02/16 0936           HPI/Subjective: Wheezing is improved , still has DOE   Objective: Vitals:   10/02/16 1457 10/02/16 2118 10/03/16 0035 10/03/16 0607  BP:  (!) 153/89  (!) 158/82  Pulse:  100  90  Resp:  19  19  Temp:  98.2 F (36.8 C)  98.7 F (37.1 C)  TempSrc:  Oral  Oral  SpO2: 97% 100% 95% 100%  Weight:      Height:        Intake/Output Summary (Last 24 hours) at 10/03/16 0756 Last data filed at 10/02/16 2120  Gross per 24 hour  Intake              930 ml  Output              100 ml  Net              830 ml    Exam:  Examination:  General exam: Appears calm and comfortable  Respiratory system: Continues to have increased respiratory effort, wheezing. Cardiovascular system: S1 & S2 heard, RRR. No JVD, murmurs, rubs, gallops or clicks. No pedal edema. Gastrointestinal system: Abdomen is nondistended, soft and nontender. No organomegaly or masses felt. Normal bowel sounds heard. Central nervous system: Alert and oriented. No focal neurological deficits. Extremities: Symmetric 5 x 5 power. Skin: No rashes, lesions or ulcers Psychiatry: Judgement and insight appear normal. Mood &  affect appropriate.     Data Reviewed: I have personally reviewed following labs and imaging studies  Micro Results No results found for this or any previous visit (from the past 240 hour(s)).  Radiology Reports Dg Chest 2 View  Result Date: 09/30/2016 CLINICAL DATA:  Worsening asthma EXAM: CHEST  2 VIEW COMPARISON:  06/21/2016 FINDINGS: The heart size and mediastinal contours are within normal limits. Both lungs are clear. The visualized skeletal structures are unremarkable. IMPRESSION: No active cardiopulmonary disease. Electronically Signed   By: Kerby Moors M.D.   On: 09/30/2016 13:18   Ct Angio Chest Pe W Or Wo Contrast  Result Date:  10/02/2016 CLINICAL DATA:  Shortness of breath. EXAM: CT ANGIOGRAPHY CHEST WITH CONTRAST TECHNIQUE: Multidetector CT imaging of the chest was performed using the standard protocol during bolus administration of intravenous contrast. Multiplanar CT image reconstructions and MIPs were obtained to evaluate the vascular anatomy. CONTRAST:  100 cc Isovue 370 COMPARISON:  Chest x-ray dated 09/30/2016 and chest CT dated 08/23/2015 FINDINGS: Cardiovascular: No visible pulmonary emboli. However, opacification of the segmental vessels is suboptimal due to the patient's body habitus. RV LV ratio is normal. Mild cardiomegaly. Mediastinum/Nodes: No enlarged mediastinal, hilar, or axillary lymph nodes. Thyroid gland, trachea, and esophagus demonstrate no significant findings. Lungs/Pleura: Lungs are clear. No pleural effusion or pneumothorax. Upper Abdomen: No acute abnormality. Musculoskeletal: No chest wall abnormality. No acute or significant osseous findings. Review of the MIP images confirms the above findings. IMPRESSION: 1. No large pulmonary emboli. Opacification of segmental vessels is suboptimal so I cannot exclude small pulmonary emboli. 2. Mild cardiomegaly. 3. Clear lungs. Electronically Signed   By: Lorriane Shire M.D.   On: 10/02/2016 11:12     CBC  Recent Labs Lab 10/02/16 0131 10/03/16 0403  WBC 22.5* 23.4*  HGB 11.7* 11.9*  HCT 37.5 38.6  PLT 363 359  MCV 84.7 85.4  MCH 26.4 26.3  MCHC 31.2 30.8  RDW 18.5* 18.9*  LYMPHSABS 2.1  --   MONOABS 1.9*  --   EOSABS 0.0  --   BASOSABS 0.0  --     Chemistries   Recent Labs Lab 10/02/16 0131 10/03/16 0403  NA 139 140  K 3.1* 3.5  CL 104 103  CO2 21* 26  GLUCOSE 145* 177*  BUN 7 8  CREATININE 0.91 0.84  CALCIUM 8.2* 8.3*  AST  --  29  ALT  --  27  ALKPHOS  --  46  BILITOT  --  0.6   ------------------------------------------------------------------------------------------------------------------ estimated creatinine clearance is  132.4 mL/min (by C-G formula based on SCr of 0.84 mg/dL). ------------------------------------------------------------------------------------------------------------------ No results for input(s): HGBA1C in the last 72 hours. ------------------------------------------------------------------------------------------------------------------ No results for input(s): CHOL, HDL, LDLCALC, TRIG, CHOLHDL, LDLDIRECT in the last 72 hours. ------------------------------------------------------------------------------------------------------------------ No results for input(s): TSH, T4TOTAL, T3FREE, THYROIDAB in the last 72 hours.  Invalid input(s): FREET3 ------------------------------------------------------------------------------------------------------------------ No results for input(s): VITAMINB12, FOLATE, FERRITIN, TIBC, IRON, RETICCTPCT in the last 72 hours.  Coagulation profile No results for input(s): INR, PROTIME in the last 168 hours.  No results for input(s): DDIMER in the last 72 hours.  Cardiac Enzymes No results for input(s): CKMB, TROPONINI, MYOGLOBIN in the last 168 hours.  Invalid input(s): CK ------------------------------------------------------------------------------------------------------------------ Invalid input(s): POCBNP   CBG:  Recent Labs Lab 10/02/16 0820 10/02/16 1228 10/02/16 1744 10/02/16 2117  GLUCAP 260* 171* 113* 140*       Studies: Ct Angio Chest Pe W Or Wo Contrast  Result Date: 10/02/2016 CLINICAL DATA:  Shortness of breath. EXAM: CT ANGIOGRAPHY CHEST WITH CONTRAST TECHNIQUE: Multidetector CT imaging of the chest was performed using the standard protocol during bolus administration of intravenous contrast. Multiplanar CT image reconstructions and MIPs were obtained to evaluate the vascular anatomy. CONTRAST:  100 cc Isovue 370 COMPARISON:  Chest x-ray dated 09/30/2016 and chest CT dated 08/23/2015 FINDINGS: Cardiovascular: No visible pulmonary  emboli. However, opacification of the segmental vessels is suboptimal due to the patient's body habitus. RV LV ratio is normal. Mild cardiomegaly. Mediastinum/Nodes: No enlarged mediastinal, hilar, or axillary lymph nodes. Thyroid gland, trachea, and esophagus demonstrate no significant findings. Lungs/Pleura: Lungs are clear. No pleural effusion or pneumothorax. Upper Abdomen: No acute abnormality. Musculoskeletal: No chest wall abnormality. No acute or significant osseous findings. Review of the MIP images confirms the above findings. IMPRESSION: 1. No large pulmonary emboli. Opacification of segmental vessels is suboptimal so I cannot exclude small pulmonary emboli. 2. Mild cardiomegaly. 3. Clear lungs. Electronically Signed   By: Lorriane Shire M.D.   On: 10/02/2016 11:12      Lab Results  Component Value Date   HGBA1C 6.4 05/03/2016   HGBA1C 6.0 (H) 08/14/2015   HGBA1C 5.8 06/09/2013   Lab Results  Component Value Date   LDLCALC 123 (H) 10/06/2009   CREATININE 0.84 10/03/2016       Scheduled Meds: . amLODipine  10 mg Oral Daily  . enoxaparin (LOVENOX) injection  70 mg Subcutaneous Q24H  . fluticasone  2 spray Each Nare Daily  . insulin aspart  0-20 Units Subcutaneous TID WC  . insulin aspart  0-5 Units Subcutaneous QHS  . insulin glargine  25 Units Subcutaneous Daily  . ipratropium  0.5 mg Nebulization BID  . levalbuterol  0.63 mg Nebulization Q4H WA  . loratadine  10 mg Oral Daily  . methocarbamol  500 mg Oral Daily  . methylPREDNISolone (SOLU-MEDROL) injection  40 mg Intravenous Q12H  . mometasone-formoterol  1 puff Inhalation BID  . nicotine  14 mg Transdermal Daily  . pantoprazole  80 mg Oral Q1200  . sertraline  50 mg Oral Daily  . spironolactone  50 mg Oral Daily  . traZODone  50 mg Oral QHS   Continuous Infusions: . 0.9 % NaCl with KCl 20 mEq / L 1,000 mL (10/03/16 0609)  . azithromycin Stopped (10/02/16 1423)     LOS: 1 day    Time spent: >30 MINS     Reyne Dumas  [facility] Pager [phone]. If 7PM-7AM, please contact night-coverage at [email or web address], password [facility] 10/03/2016, 7:56 AM  LOS: 1 day

## 2016-10-03 NOTE — Progress Notes (Signed)
  Echocardiogram 2D Echocardiogram with definity has been performed.  Darlina Sicilian M 10/03/2016, 8:58 AM

## 2016-10-03 NOTE — Progress Notes (Signed)
Lactic acid last resulted at 5.3 and none have been ordered. Will inform on call  .[id number] April [name], [name] 6/6 am was5.7. At noon, 5.3, however, I don't see an order for another. She is getting breathing tx near Q3 hrs. Thank you.  Awaiting response.

## 2016-10-03 NOTE — Procedures (Signed)
Patient refused CPAP.  Stated she does not wear one at home.

## 2016-10-04 ENCOUNTER — Telehealth: Payer: Self-pay

## 2016-10-04 ENCOUNTER — Inpatient Hospital Stay (HOSPITAL_COMMUNITY): Payer: Medicaid Other

## 2016-10-04 DIAGNOSIS — I10 Essential (primary) hypertension: Secondary | ICD-10-CM

## 2016-10-04 DIAGNOSIS — J4541 Moderate persistent asthma with (acute) exacerbation: Principal | ICD-10-CM

## 2016-10-04 LAB — GLUCOSE, CAPILLARY
GLUCOSE-CAPILLARY: 119 mg/dL — AB (ref 65–99)
GLUCOSE-CAPILLARY: 163 mg/dL — AB (ref 65–99)
Glucose-Capillary: 174 mg/dL — ABNORMAL HIGH (ref 65–99)

## 2016-10-04 LAB — BASIC METABOLIC PANEL
Anion gap: 9 (ref 5–15)
BUN: 10 mg/dL (ref 6–20)
CALCIUM: 8.6 mg/dL — AB (ref 8.9–10.3)
CHLORIDE: 102 mmol/L (ref 101–111)
CO2: 26 mmol/L (ref 22–32)
Creatinine, Ser: 0.77 mg/dL (ref 0.44–1.00)
GFR calc non Af Amer: >60 mL/min (ref >60)
Glucose, Bld: 119 mg/dL — ABNORMAL HIGH (ref 65–99)
Potassium: 3.7 mmol/L (ref 3.5–5.1)
SODIUM: 137 mmol/L (ref 135–145)

## 2016-10-04 LAB — CBC
HCT: 41.4 % (ref 36.0–46.0)
Hemoglobin: 13 g/dL (ref 12.0–15.0)
MCH: 27 pg (ref 26.0–34.0)
MCHC: 31.4 g/dL (ref 30.0–36.0)
MCV: 85.9 fL (ref 78.0–100.0)
PLATELETS: 308 10*3/uL (ref 150–400)
RBC: 4.82 MIL/uL (ref 3.87–5.11)
RDW: 18.5 % — AB (ref 11.5–15.5)
WBC: 21.5 10*3/uL — AB (ref 4.0–10.5)

## 2016-10-04 MED ORDER — AZITHROMYCIN 500 MG PO TABS
500.0000 mg | ORAL_TABLET | Freq: Every day | ORAL | Status: DC
  Administered 2016-10-04: 500 mg via ORAL
  Filled 2016-10-04: qty 1

## 2016-10-04 MED ORDER — ALBUTEROL SULFATE HFA 108 (90 BASE) MCG/ACT IN AERS: 2.0000 | INHALATION_SPRAY | Freq: Four times a day (QID) | RESPIRATORY_TRACT | 12 refills | Status: DC | PRN

## 2016-10-04 MED ORDER — FUROSEMIDE 10 MG/ML IJ SOLN
40.0000 mg | Freq: Once | INTRAMUSCULAR | Status: DC
  Filled 2016-10-04: qty 4

## 2016-10-04 MED ORDER — SPIRONOLACTONE 50 MG PO TABS: 50.0000 mg | ORAL_TABLET | Freq: Every day | ORAL | 2 refills | Status: DC

## 2016-10-04 MED ORDER — GUAIFENESIN ER 600 MG PO TB12: 1200.0000 mg | ORAL_TABLET | Freq: Two times a day (BID) | ORAL | 0 refills | Status: DC

## 2016-10-04 MED ORDER — METHOCARBAMOL 500 MG PO TABS: 500.0000 mg | ORAL_TABLET | Freq: Two times a day (BID) | ORAL | 0 refills | Status: DC

## 2016-10-04 MED ORDER — FUROSEMIDE 20 MG PO TABS: 20.0000 mg | ORAL_TABLET | Freq: Every day | ORAL | 1 refills | Status: DC

## 2016-10-04 MED ORDER — TRAZODONE HCL 50 MG PO TABS: 25.0000 mg | ORAL_TABLET | Freq: Every evening | ORAL | 3 refills | Status: DC | PRN

## 2016-10-04 MED ORDER — ALBUTEROL SULFATE (2.5 MG/3ML) 0.083% IN NEBU: 2.5000 mg | INHALATION_SOLUTION | RESPIRATORY_TRACT | 2 refills | Status: DC | PRN

## 2016-10-04 MED ORDER — AZITHROMYCIN 500 MG PO TABS: 500.0000 mg | ORAL_TABLET | Freq: Every day | ORAL | 0 refills | Status: DC

## 2016-10-04 MED ORDER — NICOTINE 14 MG/24HR TD PT24: 14.0000 mg | MEDICATED_PATCH | Freq: Every day | TRANSDERMAL | 0 refills | Status: DC

## 2016-10-04 MED ORDER — POTASSIUM CHLORIDE CRYS ER 20 MEQ PO TBCR
40.0000 meq | EXTENDED_RELEASE_TABLET | Freq: Once | ORAL | Status: AC
  Administered 2016-10-04: 40 meq via ORAL
  Filled 2016-10-04: qty 2

## 2016-10-04 MED ORDER — TRAMADOL HCL 50 MG PO TABS: 50.0000 mg | ORAL_TABLET | Freq: Four times a day (QID) | ORAL | 0 refills | Status: DC | PRN

## 2016-10-04 MED ORDER — PREDNISONE 50 MG PO TABS
60.0000 mg | ORAL_TABLET | Freq: Every day | ORAL | Status: DC
  Administered 2016-10-04: 60 mg via ORAL
  Filled 2016-10-04: qty 1

## 2016-10-04 MED ORDER — PREDNISONE 20 MG PO TABS: 60.0000 mg | ORAL_TABLET | Freq: Every day | ORAL | 0 refills | Status: AC

## 2016-10-04 MED ORDER — FUROSEMIDE 20 MG PO TABS
20.0000 mg | ORAL_TABLET | Freq: Every day | ORAL | Status: DC
Start: 2016-10-05 — End: 2016-10-04

## 2016-10-04 MED ORDER — ALBUTEROL SULFATE HFA 108 (90 BASE) MCG/ACT IN AERS: 1.0000 | INHALATION_SPRAY | Freq: Four times a day (QID) | RESPIRATORY_TRACT | 12 refills | Status: DC | PRN

## 2016-10-04 MED ORDER — FUROSEMIDE 20 MG PO TABS: 20.0000 mg | ORAL_TABLET | Freq: Every day | ORAL | Status: DC

## 2016-10-04 MED FILL — $VENTOLIN HFA 18G INHALER: 108 (90 BAS | 16 days supply | Qty: 18 | Fill #4

## 2016-10-04 NOTE — Progress Notes (Signed)
SATURATION QUALIFICATIONS: (This note is used to comply with regulatory documentation for home oxygen)  Patient Saturations on Room Air at Rest = 100%  Patient Saturations on Room Air while Ambulating = 97%  Patient Saturations on 0 Liters of oxygen while Ambulating =   Please briefly explain why patient needs home oxygen: Not necessary.

## 2016-10-04 NOTE — Progress Notes (Signed)
April [name] to be D/C'd Home per MD order.  Discussed with the patient and all questions fully answered.  VSS, Skin clean, dry and intact without evidence of skin break down, no evidence of skin tears noted. IV catheter discontinued intact. Site without signs and symptoms of complications. Dressing and pressure applied.  An After Visit Summary was printed and given to the patient. Patient received prescription.  D/c education completed with patient/family including follow up instructions, medication list, d/c activities limitations if indicated, with other d/c instructions as indicated by MD - patient able to verbalize understanding, all questions fully answered.   Patient instructed to return to ED, call 911, or call MD for any changes in condition.   Patient escorted via [place], and D/C home via private auto.  Christoper Fabian Alric Geise 10/04/2016 7:04 PM

## 2016-10-04 NOTE — Hospital Discharge Follow-Up (Signed)
Transitional Care Clinic Care Coordination Note:  Admit date:  10/02/16 Discharge date: 10/04/16 Discharge Disposition: home  Patient contact: # [phone] ( cell)  Emergency contact(s): # [phone] ( Mother - April [name])  This Case Manager reviewed patient's EMR and determined patient would benefit from post-discharge medical management and chronic care management services through the [facility]. Patient has a history of asthma, NIDDM, OSA on CPAP, COPD, HTN and was admitted with dyspnea and chest tightness.  She was treated for asthma exacerbation. Since 04/29/16, she has 7 ED visits and 3 hospital admissions.  This Case Manager met with patient to discuss the services and medical management that can be provided at the [facility]. Patient verbalized understanding and agreed to receive post-discharge care at the [facility].   Patient scheduled for Transitional Care appointment on 10/10/16 at 0945.  Clinic information and appointment time provided to patient. Appointment information also placed on AVS.  Assessment:       Home Environment: lives in duplex with her boyfriend. Single story, no steps.  She stated that her boyfriend works.        Support System: has a mother daughter who live in the area       Level of functioning: independent       Home DME: nebulizer but stated that its old and she could use a new one.        Home care services: (services arranged prior to discharge or new services after discharge): none       Transportation:she has a car. No problems with transportation reported         Food/Nutrition: (ability to afford, access, use of any community resources) No problem reported accessing food. She has been denied food stamps. Provided her with the booklets for [employer] and [employer] in [place].         Medications: (ability to afford, access, compliance, Pharmacy used) Uses [facility] on [address]. Stated that most  prescriptions are $3.00 and she did not report any problems affording her medications.         Identified Barriers: no income - stated that she is trying to get disability.        PCP (Name, office location, phone number): Dr Adrian Blackwater.   She stated that she is out of rescue inhalers. This CM spoke to Veneda Melter, [facility] who stated that the patient has been approved for ventolin through the PASS program and can pick up the inhaler this afternoon.   Whitman Hero, RN CM to contact Heath Lark, [facility] Liaison to request that the patient be assessed for their services.    Patient Education:  Provided her with the phone # for [facility] transportation. Instructed her to call that number to have a transportation assessment completed.  Also discussed [facility] services. Will provide an application at her appointment with Dr Jarold Song if she is interested.             Arranged services: Whitman Hero. RN CM

## 2016-10-04 NOTE — Progress Notes (Signed)
CM received consult:  Patient states she does not have a PCP. Please schedule appointment in 3-5 days    [facility]   [phone] [place] [address]    Next Steps: Go on 10/10/2016    Instructions: at 9:45am for an appointment with Dr Jarold Song in the [place] Nurse ,Opal Sidles, made pt aware. Whitman Hero RN,BSN,CM

## 2016-10-04 NOTE — Discharge Summary (Addendum)
Physician Discharge Summary  April [name] MRN: [id number] DOB/AGE: 12/19/1970 46 y.o.  PCP: Boykin Nearing, MD   Admit date: 10/02/2016 Discharge date: 10/04/2016  Discharge Diagnoses:    Principal Problem:   Asthma exacerbation Active Problems:   Morbid obesity with body mass index of 50.0-59.9 in adult [facility])   Major depressive disorder, recurrent episode ([place])   Essential hypertension   Hypokalemia   Mild obstructive sleep apnea   Type 2 diabetes mellitus without complication, without long-term current use of insulin (HCC)   Normocytic anemia   Respiratory failure ([place])  Addendum Patient popped her left knee just before dc Can't walk CT knee pending    Follow-up with PCP in 3-5 days , including all  additional recommended appointments as below Follow-up CBC, CMP in 3-5 days Case management has been requested to set up follow-up appointment with PCP prior to discharge      Current Discharge Medication List    START taking these medications   Details  azithromycin (ZITHROMAX) 500 MG tablet Take 1 tablet (500 mg total) by mouth daily. Qty: 5 tablet, Refills: 0    furosemide (LASIX) 20 MG tablet Take 1 tablet (20 mg total) by mouth daily. Qty: 30 tablet, Refills: 1    nicotine (NICODERM CQ - DOSED IN MG/24 HOURS) 14 mg/24hr patch Place 1 patch (14 mg total) onto the skin daily. Qty: 28 patch, Refills: 0    predniSONE (DELTASONE) 20 MG tablet Take 3 tablets (60 mg total) by mouth daily with breakfast. Qty: 15 tablet, Refills: 0      CONTINUE these medications which have CHANGED   Details  albuterol (PROAIR HFA) 108 (90 Base) MCG/ACT inhaler Inhale 1-2 puffs into the lungs every 6 (six) hours as needed for wheezing or shortness of breath. Qty: 1 Inhaler, Refills: 12    albuterol (PROVENTIL) (2.5 MG/3ML) 0.083% nebulizer solution Take 3 mLs (2.5 mg total) by nebulization every 4 (four) hours as needed for wheezing or shortness of breath. Qty: 150 mL, Refills: 2    Associated Diagnoses: Asthma, chronic obstructive, without status asthmaticus (HCC)    guaiFENesin (MUCINEX) 600 MG 12 hr tablet Take 2 tablets (1,200 mg total) by mouth 2 (two) times daily. Qty: 28 tablet, Refills: 0    methocarbamol (ROBAXIN) 500 MG tablet Take 1 tablet (500 mg total) by mouth 2 (two) times daily. Qty: 20 tablet, Refills: 0      CONTINUE these medications which have NOT CHANGED   Details  amLODipine (NORVASC) 10 MG tablet Take 1 tablet (10 mg total) by mouth daily. Qty: 30 tablet, Refills: 2   Associated Diagnoses: Essential hypertension    esomeprazole (NEXIUM) 40 MG capsule Take 1 capsule (40 mg total) by mouth daily. Qty: 30 capsule, Refills: 12    fluticasone (FLONASE) 50 MCG/ACT nasal spray Place 2 sprays into both nostrils daily. Qty: 16 g, Refills: 6    ipratropium (ATROVENT) 0.02 % nebulizer solution Take 2.5 mLs (0.5 mg total) by nebulization 4 (four) times daily as needed for wheezing or shortness of breath. Qty: 150 mL, Refills: 2   Associated Diagnoses: Asthma, chronic obstructive, without status asthmaticus (HCC)    loratadine (CLARITIN) 10 MG tablet Take 1 tablet (10 mg total) by mouth daily. Qty: 90 tablet, Refills: 3   Associated Diagnoses: Asthma, chronic obstructive, without status asthmaticus (HCC)    metFORMIN (GLUCOPHAGE) 500 MG tablet Take 1 tablet (500 mg total) by mouth 2 (two) times daily with a meal. Qty: 60 tablet, Refills:  0    mometasone-formoterol (DULERA) 200-5 MCG/ACT AERO Inhale 2 puffs into the lungs 2 (two) times daily. Qty: 1 Inhaler, Refills: 2    OVER THE COUNTER MEDICATION Place 1 patch onto the skin as needed (Nicotine therapy).    sertraline (ZOLOFT) 50 MG tablet Take 1 tablet (50 mg total) by mouth daily. Qty: 90 tablet, Refills: 3   Associated Diagnoses: Generalized anxiety disorder    spironolactone (ALDACTONE) 50 MG tablet Take 2 tablets (100 mg total) by mouth daily. Qty: 60 tablet, Refills: 2   Associated  Diagnoses: Essential hypertension    traMADol (ULTRAM) 50 MG tablet Take 1 tablet (50 mg total) by mouth every 6 (six) hours as needed. Qty: 10 tablet, Refills: 0    traZODone (DESYREL) 50 MG tablet Take 0.5-1 tablets (25-50 mg total) by mouth at bedtime as needed for sleep. Qty: 30 tablet, Refills: 3   Associated Diagnoses: Primary insomnia      STOP taking these medications     acetaminophen (TYLENOL) 500 MG tablet          Discharge Condition: Stable   Discharge Instructions Get Medicines reviewed and adjusted: Please take all your medications with you for your next visit with your Primary MD  Please request your Primary MD to go over all hospital tests and procedure/radiological results at the follow up, please ask your Primary MD to get all Hospital records sent to his/her office.  If you experience worsening of your admission symptoms, develop shortness of breath, life threatening emergency, suicidal or homicidal thoughts you must seek medical attention immediately by calling 911 or calling your MD immediately if symptoms less severe.  You must read complete instructions/literature along with all the possible adverse reactions/side effects for all the Medicines you take and that have been prescribed to you. Take any new Medicines after you have completely understood and accpet all the possible adverse reactions/side effects.   Do not drive when taking Pain medications.   Do not take more than prescribed Pain, Sleep and Anxiety Medications  Special Instructions: If you have smoked or chewed Tobacco in the last 2 yrs please stop smoking, stop any regular Alcohol and or any Recreational drug use.  Wear Seat belts while driving.  Please note  You were cared for by a hospitalist during your hospital stay. Once you are discharged, your primary care physician will handle any further medical issues. Please note that NO REFILLS for any discharge medications will be authorized  once you are discharged, as it is imperative that you return to your primary care physician (or establish a relationship with a primary care physician if you do not have one) for your aftercare needs so that they can reassess your need for medications and monitor your lab values.     No Known Allergies    Disposition: 01-Home or Self Care   Consults:  None    Significant Diagnostic Studies:  Dg Chest 2 View  Result Date: 09/30/2016 CLINICAL DATA:  Worsening asthma EXAM: CHEST  2 VIEW COMPARISON:  06/21/2016 FINDINGS: The heart size and mediastinal contours are within normal limits. Both lungs are clear. The visualized skeletal structures are unremarkable. IMPRESSION: No active cardiopulmonary disease. Electronically Signed   By: Kerby Moors M.D.   On: 09/30/2016 13:18   Ct Angio Chest Pe W Or Wo Contrast  Result Date: 10/02/2016 CLINICAL DATA:  Shortness of breath. EXAM: CT ANGIOGRAPHY CHEST WITH CONTRAST TECHNIQUE: Multidetector CT imaging of the chest was performed using the  standard protocol during bolus administration of intravenous contrast. Multiplanar CT image reconstructions and MIPs were obtained to evaluate the vascular anatomy. CONTRAST:  100 cc Isovue 370 COMPARISON:  Chest x-ray dated 09/30/2016 and chest CT dated 08/23/2015 FINDINGS: Cardiovascular: No visible pulmonary emboli. However, opacification of the segmental vessels is suboptimal due to the patient's body habitus. RV LV ratio is normal. Mild cardiomegaly. Mediastinum/Nodes: No enlarged mediastinal, hilar, or axillary lymph nodes. Thyroid gland, trachea, and esophagus demonstrate no significant findings. Lungs/Pleura: Lungs are clear. No pleural effusion or pneumothorax. Upper Abdomen: No acute abnormality. Musculoskeletal: No chest wall abnormality. No acute or significant osseous findings. Review of the MIP images confirms the above findings. IMPRESSION: 1. No large pulmonary emboli. Opacification of segmental vessels  is suboptimal so I cannot exclude small pulmonary emboli. 2. Mild cardiomegaly. 3. Clear lungs. Electronically Signed   By: Lorriane Shire M.D.   On: 10/02/2016 11:12        Filed Weights   10/02/16 0556  Weight: (!) 153.7 kg (338 lb 14.4 oz)     Microbiology: No results found for this or any previous visit (from the past 240 hour(s)).     Blood Culture    Component Value Date/Time   SDES THROAT 02/28/2016 0901   SPECREQUEST NONE Reflexed from [id number] 02/28/2016 0901   CULT NO GROUP A STREP (S.PYOGENES) ISOLATED 02/28/2016 0901   REPTSTATUS 03/01/2016 FINAL 02/28/2016 0901      Labs: Results for orders placed or performed during the hospital encounter of 10/02/16 (from the past 48 hour(s))  Lactic acid, plasma     Status: Abnormal   Collection Time: 10/02/16 11:59 AM  Result Value Ref Range   Lactic Acid, Venous 5.3 (HH) 0.5 - 1.9 mmol/L    Comment: CRITICAL RESULT CALLED TO, READ BACK BY AND VERIFIED WITH: D.JOHNSON,RN 1311 10/02/16 CLARK,S   Glucose, capillary     Status: Abnormal   Collection Time: 10/02/16 12:28 PM  Result Value Ref Range   Glucose-Capillary 171 (H) 65 - 99 mg/dL  Glucose, capillary     Status: Abnormal   Collection Time: 10/02/16  5:44 PM  Result Value Ref Range   Glucose-Capillary 113 (H) 65 - 99 mg/dL  Glucose, capillary     Status: Abnormal   Collection Time: 10/02/16  9:17 PM  Result Value Ref Range   Glucose-Capillary 140 (H) 65 - 99 mg/dL  Comprehensive metabolic panel     Status: Abnormal   Collection Time: 10/03/16  4:03 AM  Result Value Ref Range   Sodium 140 135 - 145 mmol/L   Potassium 3.5 3.5 - 5.1 mmol/L   Chloride 103 101 - 111 mmol/L   CO2 26 22 - 32 mmol/L   Glucose, Bld 177 (H) 65 - 99 mg/dL   BUN 8 6 - 20 mg/dL   Creatinine, Ser 0.84 0.44 - 1.00 mg/dL   Calcium 8.3 (L) 8.9 - 10.3 mg/dL   Total Protein 5.7 (L) 6.5 - 8.1 g/dL   Albumin 3.1 (L) 3.5 - 5.0 g/dL   AST 29 15 - 41 U/L   ALT 27 14 - 54 U/L   Alkaline  Phosphatase 46 38 - 126 U/L   Total Bilirubin 0.6 0.3 - 1.2 mg/dL   GFR calc non Af Amer >60 >60 mL/min   GFR calc Af Amer >60 >60 mL/min    Comment: (NOTE) The eGFR has been calculated using the CKD EPI equation. This calculation has not been validated in all clinical situations. eGFR's  persistently <60 mL/min signify possible Chronic Kidney Disease.    Anion gap 11 5 - 15  CBC     Status: Abnormal   Collection Time: 10/03/16  4:03 AM  Result Value Ref Range   WBC 23.4 (H) 4.0 - 10.5 K/uL   RBC 4.52 3.87 - 5.11 MIL/uL   Hemoglobin 11.9 (L) 12.0 - 15.0 g/dL   HCT 38.6 36.0 - 46.0 %   MCV 85.4 78.0 - 100.0 fL   MCH 26.3 26.0 - 34.0 pg   MCHC 30.8 30.0 - 36.0 g/dL   RDW 18.9 (H) 11.5 - 15.5 %   Platelets 359 150 - 400 K/uL  Glucose, capillary     Status: Abnormal   Collection Time: 10/03/16  9:02 AM  Result Value Ref Range   Glucose-Capillary 136 (H) 65 - 99 mg/dL  Glucose, capillary     Status: Abnormal   Collection Time: 10/03/16 11:58 AM  Result Value Ref Range   Glucose-Capillary 148 (H) 65 - 99 mg/dL  Glucose, capillary     Status: Abnormal   Collection Time: 10/03/16  5:37 PM  Result Value Ref Range   Glucose-Capillary 137 (H) 65 - 99 mg/dL  Glucose, capillary     Status: Abnormal   Collection Time: 10/03/16 10:08 PM  Result Value Ref Range   Glucose-Capillary 152 (H) 65 - 99 mg/dL  Glucose, capillary     Status: Abnormal   Collection Time: 10/04/16  7:55 AM  Result Value Ref Range   Glucose-Capillary 174 (H) 65 - 99 mg/dL   *Note: Due to a large number of results and/or encounters for the requested time period, some results have not been displayed. A complete set of results can be found in Results Review.     Lipid Panel     Component Value Date/Time   CHOL 183 10/06/2009 2113   TRIG 67 10/06/2009 2113   HDL 47 10/06/2009 2113   CHOLHDL 3.9 Ratio 10/06/2009 2113   VLDL 13 10/06/2009 2113   LDLCALC 123 (H) 10/06/2009 2113     Lab Results  Component  Value Date   HGBA1C 6.4 05/03/2016   HGBA1C 6.0 (H) 08/14/2015   HGBA1C 5.8 06/09/2013        HPI :  April [name] is a 46 y.o. female with a past medical history significant for asthma on Xolair, morbid obesity, NIDDM, OSA on CPAP who presents with dyspnea, chest tightness for 4 days failing outpatient therapy.  The patient was in her usual state of health until about last Friday when she was around grandchildren who all had upper respiratory infections, and afterwards she started to have chest tightness, wheezing, shortness of breath. She presented to the emergency room where she was treated with IV steroids, nebulized bronchodilators, felt better and went home with prednisone. She took a dose of prednisone today, but despite that and use of her home nebulizers, throughout the day she continued to feel dyspneic, so she came back to the emergency room. No fever, chills, chest pain, sputum production.  ED course: -Afebrile, heart rate is 20, respirations 26, pulse 97% on 2 L nasal cannula blood pressure 160/119 -Na 139, K 3.1, Cr 0.91, WBC 22.5K, Hgb 11.7 -She was given Solu-Medrol, magnesium, and a continuous albuterol neb and still felt dyspneic with any exertion, so TRH were asked to evaluate for asthma flare  HOSPITAL COURSE:     Moderate to severe Asthma exacerbation: -Patient received high-dose IV Solu-Medrol  albuterol every 4 hours  when necessary -Continue loratadine and Flonase -Continue Dulera -Empiric antibiotics due to severity of exacerbation, we will continue azithromycin for another 5 days CT negative for large PE, small PE cannot be excluded   Acute hypoxic respiratory failure, gradually improving Multifactorial could be secondary to asthma exacerbation, component of underlying COPD due to nicotine dependence, acute on chronic diastolic heart failure, OSA Continue to address each issue Patient also started on diuresis with IV Lasix for suspected acute on chronic  diastolic heart failure Patient to continue with oral Lasix 20 mg a day Ambulatory pulse oximetry checked  prior to discharge-patient 97% on room air.     Hypokalemia: Repleted    Anemia: Chronic normocytic anemia, stable, baseline hemoglobin 11.9, stable    Leukocytosis: Could be secondary to steroids, follow    Type 2 diabetes:accuchecks stable  Please follow-up on the results of hemoglobin A1c which is currently pending Continue metformin    OSA: -Continue CPAP   Hypertension: Hypertensive at admission. -Continue amlodipine and aldactone  Insomnia Continue trazodone    Discharge Exam:   Blood pressure (!) 143/70, pulse 90, temperature 98.5 F (36.9 C), temperature source Oral, resp. rate 18, height '5\' 6"'$  (1.676 m), weight (!) 153.7 kg (338 lb 14.4 oz), last menstrual period 09/28/2016, SpO2 98 %.  Respiratory system: Respiratory effort and wheezing has improved. Cardiovascular system: S1 & S2 heard, RRR. No JVD, murmurs, rubs, gallops or clicks. No pedal edema. Gastrointestinal system: Abdomen is nondistended, soft and nontender. No organomegaly or masses felt. Normal bowel sounds heard. Central nervous system: Alert and oriented. No focal neurological deficits. Extremities: Symmetric 5 x 5 power. Skin: No rashes, lesions or ulcers Psychiatry: Judgement and insight appear normal. Mood & affect appropriate.     Follow-up Information    Boykin Nearing, MD. Call.   Specialty:  Family Medicine Why:  Hospital follow-up in 3-5 days Contact information: [place] [id number] [phone]           Signed: Reyne Dumas 10/04/2016, 8:30 AM        Time spent >1 hour

## 2016-10-04 NOTE — Telephone Encounter (Signed)
Call received from Heath Lark, [facility] Liaison at [facility]. She explained the outcome of the visit that she had with the patient today and noted that the patient has been referred to them in the past. She will have the CM follow up in the community,

## 2016-10-05 NOTE — Care Management (Signed)
Pt discharged home yesterday.  CM informed that pt did not receive rolling walker prior to discharge due to late evening discharge.Per bedside nurse choice was given and pt chose [facility].  [facility] has agreed to deliver rolling walker to the home.  CM contacted pt and left voicemail

## 2016-10-07 ENCOUNTER — Telehealth: Payer: Self-pay

## 2016-10-07 NOTE — Telephone Encounter (Signed)
Transitional Care Clinic Post-discharge Follow-Up Phone Call:  Date of Discharge: 10/04/2016 Principal Discharge Diagnosis(es): asthma exacerbation Post-discharge Communication: (Clearly document all attempts clearly and date contact made) call placed to the patient # [phone] and [name] answered and took a message for the patient to return this call to # [phone]. Call Completed: No

## 2016-10-07 NOTE — Telephone Encounter (Signed)
Transitional Care Clinic Post-discharge Follow-Up Phone Call:  Date of Discharge: 6/82018 Principal Discharge Diagnosis(es): asthma exacerbation Post-discharge Communication: (Clearly document all attempts clearly and date contact made) call received from the patient Call Completed: Yes                  With Whom: Patient  Interpreter Needed: No     Please check all that apply:  X  Patient is knowledgeable of his/her condition(s) and/or treatment. X  Patient is caring for self at home. Her boyfriend lives with her.  ? Patient is receiving assist at home from family and/or caregiver. Family and/or caregiver is knowledgeable of patient's condition(s) and/or treatment. ? Patient is receiving home health services. If so, name of agency.     Medication Reconciliation:  ? Medication list reviewed with patient. X  Patient obtained all discharge medications. If not, why? - she stated that she has all of her medications and is taking them as ordered. She then noted that she is only taking prednisone 20 mg tablets - 2 tablets a day, not 3 tablets daily as ordered. She said that it is causing her to gain weight and she doesn't want to take 3 tablets.  Informed her that Dr Amao would be notified. She said that she was not at home and did not have her medication list with her. She stated that she did not have any questions about the medications. She said that she has the zithromax and has 2 more days left to take it. She also confirmed that she is taking the lasix and using the nicotine patch as ordered. She said that she is not taking acetaminophen as she has been instructed to stop taking it  and is using her tramadol if needed.  She said that she is using her nebulizer 1-2 times /day and is using her rescue inhaler a " couple" of times a day. She noted that she is still congested but the congestion is improving.    Activities of Daily Living:  X  Independent- but has a RW to use if needed. She  explained that her knee " popped" at the hospital prior to discharge but it has been feeling " better."   ? Needs assist (describe; ? home DME used) ? [facility] (describe, ? home DME used)   Community resources in place for patient:  X  [facility]  - Kim Lanier, Liaison at [facility] met with the patient prior to discharge.  ? Home Health/Home DME ? Assisted Living ? Support Group            Questions/Concerns discussed: The patient stated that she has no questions/concerns at this time. She also confirmed that she will be at her appointment on 10/10/16 @ 0945 and has transportation to the clinic.  Message sent to Dr Amao notifying her that the patient is not taking hte prednisone as ordered.   

## 2016-10-09 ENCOUNTER — Telehealth: Payer: Self-pay | Admitting: Internal Medicine

## 2016-10-09 MED ORDER — OMEPRAZOLE 20 MG PO CPDR: 20.0000 mg | DELAYED_RELEASE_CAPSULE | Freq: Every day | ORAL | 11 refills | Status: DC

## 2016-10-09 NOTE — Telephone Encounter (Signed)
Left detailed message on patient's VM. Rx has been called into pharmacy.

## 2016-10-09 NOTE — Telephone Encounter (Signed)
Tell her insurance won't cover Nexium. We recommend omeprazole 20 mg each morning before breakfast                                                                            #30, ref x 12

## 2016-10-09 NOTE — Telephone Encounter (Signed)
Spoke with the pt  She is asking for rx for Ambien  She states that they were giving this to her in the hospital and it worked well  She tried the samples of the RemFresh and states that they did not help at all  Please advise, thanks!

## 2016-10-09 NOTE — Telephone Encounter (Signed)
Received fax from [facility] requesting PA for Nexium 40 mg 1 cap daily   Number to call for PA in [phone]  I called this number and it was [employer]   Reviewed the [facility] Medicaid preferred drug list  Covered alternatives for Nexium are:  Omeprazole or Pantoprazole

## 2016-10-10 ENCOUNTER — Encounter: Payer: Self-pay | Admitting: Family Medicine

## 2016-10-10 ENCOUNTER — Ambulatory Visit: Payer: Medicaid Other | Attending: Family Medicine | Admitting: Family Medicine

## 2016-10-10 VITALS — BP 120/77 | HR 93 | Temp 98.7°F | Resp 18 | Ht 66.0 in | Wt 337.0 lb

## 2016-10-10 DIAGNOSIS — G4733 Obstructive sleep apnea (adult) (pediatric): Secondary | ICD-10-CM | POA: Insufficient documentation

## 2016-10-10 DIAGNOSIS — F330 Major depressive disorder, recurrent, mild: Secondary | ICD-10-CM | POA: Diagnosis not present

## 2016-10-10 DIAGNOSIS — I5033 Acute on chronic diastolic (congestive) heart failure: Secondary | ICD-10-CM | POA: Insufficient documentation

## 2016-10-10 DIAGNOSIS — Z6841 Body Mass Index (BMI) 40.0 and over, adult: Secondary | ICD-10-CM

## 2016-10-10 DIAGNOSIS — M25562 Pain in left knee: Secondary | ICD-10-CM | POA: Diagnosis not present

## 2016-10-10 DIAGNOSIS — Z9119 Patient's noncompliance with other medical treatment and regimen: Secondary | ICD-10-CM | POA: Diagnosis not present

## 2016-10-10 DIAGNOSIS — J449 Chronic obstructive pulmonary disease, unspecified: Secondary | ICD-10-CM | POA: Insufficient documentation

## 2016-10-10 DIAGNOSIS — E119 Type 2 diabetes mellitus without complications: Secondary | ICD-10-CM | POA: Insufficient documentation

## 2016-10-10 DIAGNOSIS — Z7984 Long term (current) use of oral hypoglycemic drugs: Secondary | ICD-10-CM | POA: Diagnosis not present

## 2016-10-10 DIAGNOSIS — J4541 Moderate persistent asthma with (acute) exacerbation: Secondary | ICD-10-CM | POA: Insufficient documentation

## 2016-10-10 DIAGNOSIS — Z79899 Other long term (current) drug therapy: Secondary | ICD-10-CM | POA: Diagnosis not present

## 2016-10-10 DIAGNOSIS — M542 Cervicalgia: Secondary | ICD-10-CM

## 2016-10-10 DIAGNOSIS — I110 Hypertensive heart disease with heart failure: Secondary | ICD-10-CM | POA: Diagnosis not present

## 2016-10-10 DIAGNOSIS — I10 Essential (primary) hypertension: Secondary | ICD-10-CM

## 2016-10-10 DIAGNOSIS — G8929 Other chronic pain: Secondary | ICD-10-CM | POA: Diagnosis not present

## 2016-10-10 DIAGNOSIS — K219 Gastro-esophageal reflux disease without esophagitis: Secondary | ICD-10-CM | POA: Insufficient documentation

## 2016-10-10 MED ORDER — ALBUTEROL SULFATE (2.5 MG/3ML) 0.083% IN NEBU
2.5000 mg | INHALATION_SOLUTION | Freq: Once | RESPIRATORY_TRACT | Status: AC
  Administered 2016-10-10: 2.5 mg via RESPIRATORY_TRACT

## 2016-10-10 MED ORDER — METHOCARBAMOL 500 MG PO TABS: 500.0000 mg | ORAL_TABLET | Freq: Two times a day (BID) | ORAL | 1 refills | Status: DC

## 2016-10-10 MED ORDER — ZOLPIDEM TARTRATE 10 MG PO TABS: 10.0000 mg | ORAL_TABLET | Freq: Every evening | ORAL | 5 refills | Status: DC | PRN

## 2016-10-10 MED ORDER — CEFTRIAXONE SODIUM 500 MG IJ SOLR
500.0000 mg | Freq: Once | INTRAMUSCULAR | Status: AC
  Administered 2016-10-10: 500 mg via INTRAMUSCULAR

## 2016-10-10 MED ORDER — LEVOFLOXACIN 750 MG PO TABS: 750.0000 mg | ORAL_TABLET | Freq: Every day | ORAL | 0 refills | Status: DC

## 2016-10-10 MED ORDER — METHYLPREDNISOLONE SODIUM SUCC 125 MG IJ SOLR
125.0000 mg | Freq: Once | INTRAMUSCULAR | Status: AC
  Administered 2016-10-10: 125 mg via INTRAMUSCULAR

## 2016-10-10 NOTE — Patient Instructions (Signed)
Asthma, Acute Bronchospasm °Acute bronchospasm caused by asthma is also referred to as an asthma attack. Bronchospasm means your air passages become narrowed. The narrowing is caused by inflammation and tightening of the muscles in the air tubes (bronchi) in your lungs. This can make it hard to breathe or cause you to wheeze and cough. °What are the causes? °Possible triggers are: °· Animal dander from the skin, hair, or feathers of animals. °· Dust mites contained in house dust. °· Cockroaches. °· Pollen from trees or grass. °· Mold. °· Cigarette or tobacco smoke. °· Air pollutants such as dust, household cleaners, hair sprays, aerosol sprays, paint fumes, strong chemicals, or strong odors. °· Cold air or weather changes. Cold air may trigger inflammation. Winds increase molds and pollens in the air. °· Strong emotions such as crying or laughing hard. °· Stress. °· Certain medicines such as aspirin or beta-blockers. °· Sulfites in foods and drinks, such as dried fruits and wine. °· Infections or inflammatory conditions, such as a flu, cold, or inflammation of the nasal membranes (rhinitis). °· Gastroesophageal reflux disease (GERD). GERD is a condition where stomach acid backs up into your esophagus. °· Exercise or strenuous activity. ° °What are the signs or symptoms? °· Wheezing. °· Excessive coughing, particularly at night. °· Chest tightness. °· Shortness of breath. °How is this diagnosed? °Your health care provider will ask you about your medical history and perform a physical exam. A chest X-ray or blood testing may be performed to look for other causes of your symptoms or other conditions that may have triggered your asthma attack. °How is this treated? °Treatment is aimed at reducing inflammation and opening up the airways in your lungs. Most asthma attacks are treated with inhaled medicines. These include quick relief or rescue medicines (such as bronchodilators) and controller medicines (such as inhaled  corticosteroids). These medicines are sometimes given through an inhaler or a nebulizer. Systemic steroid medicine taken by mouth or given through an IV tube also can be used to reduce the inflammation when an attack is moderate or severe. Antibiotic medicines are only used if a bacterial infection is present. °Follow these instructions at home: °· Rest. °· Drink plenty of liquids. This helps the mucus to remain thin and be easily coughed up. Only use caffeine in moderation and do not use alcohol until you have recovered from your illness. °· Do not smoke. Avoid being exposed to secondhand smoke. °· You play a critical role in keeping yourself in good health. Avoid exposure to things that cause you to wheeze or to have breathing problems. °· Keep your medicines up-to-date and available. Carefully follow your health care provider’s treatment plan. °· Take your medicine exactly as prescribed. °· When pollen or pollution is bad, keep windows closed and use an air conditioner or go to places with air conditioning. °· Asthma requires careful medical care. See your health care provider for a follow-up as advised. If you are more than [redacted] weeks pregnant and you were prescribed any new medicines, let your obstetrician know about the visit and how you are doing. Follow up with your health care provider as directed. °· After you have recovered from your asthma attack, make an appointment with your outpatient doctor to talk about ways to reduce the likelihood of future attacks. If you do not have a doctor who manages your asthma, make an appointment with a primary care doctor to discuss your asthma. °Get help right away if: °· You are getting worse. °·   You have trouble breathing. If severe, call your local emergency services (911 in the [place] °· You develop chest pain or discomfort. °· You are vomiting. °· You are not able to keep fluids down. °· You are coughing up yellow, green, brown, or bloody sputum. °· You have a fever  and your symptoms suddenly get worse. °· You have trouble swallowing. °This information is not intended to replace advice given to you by your health care provider. Make sure you discuss any questions you have with your health care provider. °Document Released: 07/31/2006 Document Revised: 09/27/2015 Document Reviewed: 10/21/2012 °Elsevier Interactive Patient Education © 2017 [employer]vier Inc. ° °

## 2016-10-10 NOTE — Telephone Encounter (Signed)
I looked at the 2018 [facility] formulary  It states preferred meds- zolpidem tablet, temazepam, and flurazepam  The brand ambien and zolpidem ER and zolpidem SL are NOT preferred  Thanks!

## 2016-10-10 NOTE — Progress Notes (Signed)
Patient is here for Neck Pain  Patient has not taken medication today. Patient has not eaten today.  Patient complains of neck and left knee pain being present. Pain is scaled at a 10 currently.

## 2016-10-10 NOTE — Telephone Encounter (Signed)
Will her insurance cover ambien? Doubt it.

## 2016-10-10 NOTE — Telephone Encounter (Signed)
Rx has been called in. Pt is aware. Nothing further was needed. 

## 2016-10-10 NOTE — Telephone Encounter (Signed)
Pt called stating medication was not available to be picked up from pharmacy ([facility] on [address]).. Pt contact # [phone].Marland Kitchenert

## 2016-10-10 NOTE — Telephone Encounter (Signed)
Ok to give zolpidem 10 mg, # 30, 1 for sleep if needed, ref x 5

## 2016-10-10 NOTE — Progress Notes (Signed)
Transitional care clinic  Hospitalization dates: 10/02/16 through 10/04/16  Subjective:  Patient ID: April [name], female    DOB: 09/10/1970  Age: 46 y.o. MRN: [id number]  CC: Neck Pain   HPI April [name] is a 46 year old female with a history of morbid obesity, major depressive disorder, hypertension, type 2 diabetes mellitus (A1c 6.4), asthma recently hospitalized for acute hypoxic respiratory failure secondary to multiple factors including asthma exacerbation, acute on chronic diastolic failure, obstructive sleep apnea.  She had presented to the ED with dyspnea, wheezing, chest tightness and was found to be tachypneic with an oxygen saturation of 97% on 2 L of oxygen. She was also hypokalemic and received potassium replacement. IV magnesium, nebulizer treatments, IV Solu-Medrol administered, empiric antibiotics. CT angiogram of the chest was negative for large PE. She was commenced on Lasix for acute on chronic diastolic heart failure; symptoms improved and she was subsequently discharged on azithromycin   She has completed her course of azithromycin and  will be taking her last dose of prednisone today but states that she feels like going back to the hospital as her symptoms have not improved. She complains of shortness of breath on mild exertion, cough, wheezing, chest congestion but denies fever. She had a nebulizer treatment this morning prior to her appointment.  Of note when the case manager had called her she had admitted to taking 40 mg of prednisone daily rather than 60 mg which she was discharged on due to his concern of weight gain.  She does have neck pain and is unsure if this is a result of sleeping in the wrong position and also has persistent left knee pain more on the medial aspect of her left knee. CT of the left knee from hospitalization revealed marked advanced for age tricompartmental osteoarthritis.  Past Medical History:  Diagnosis Date  . Acanthosis nigricans   .  Arthritis   . Asthma    Followed by Dr. Annamaria Boots (pulmonology); receives every other week omalizumab injections; has frequent exacerbations  . COPD (chronic obstructive pulmonary disease) ([place])    PFTs in 2002, FEV1/FVC 65, no post bronchodilater test done  . Depression   . GERD (gastroesophageal reflux disease)   . Headache(784.0)   . Helicobacter pylori (H. pylori) infection   . Hypertension, essential   . Insomnia   . Menorrhagia   . Morbid obesity ([place])   . Obesity   . Seasonal allergies   . Shortness of breath   . Sleep apnea    Sleep study 2008 - mild OSA, not enough events to titrate CPAP  . Tobacco user     Past Surgical History:  Procedure Laterality Date  . BREAST REDUCTION SURGERY  09/2011  . TUBAL LIGATION  1996   bilateral    No Known Allergies   Outpatient Medications Prior to Visit  Medication Sig Dispense Refill  . albuterol (PROAIR HFA) 108 (90 Base) MCG/ACT inhaler Inhale 2 puffs into the lungs every 6 (six) hours as needed for wheezing or shortness of breath. 1 Inhaler 12  . albuterol (PROVENTIL) (2.5 MG/3ML) 0.083% nebulizer solution Take 3 mLs (2.5 mg total) by nebulization every 4 (four) hours as needed for wheezing or shortness of breath. 150 mL 2  . amLODipine (NORVASC) 10 MG tablet Take 1 tablet (10 mg total) by mouth daily. 30 tablet 2  . esomeprazole (NEXIUM) 40 MG capsule Take 1 capsule (40 mg total) by mouth daily. 30 capsule 12  . fluticasone (FLONASE) 50 MCG/ACT  nasal spray Place 2 sprays into both nostrils daily. 16 g 6  . furosemide (LASIX) 20 MG tablet Take 1 tablet (20 mg total) by mouth daily. 30 tablet 1  . ipratropium (ATROVENT) 0.02 % nebulizer solution Take 2.5 mLs (0.5 mg total) by nebulization 4 (four) times daily as needed for wheezing or shortness of breath. 150 mL 2  . loratadine (CLARITIN) 10 MG tablet Take 1 tablet (10 mg total) by mouth daily. 90 tablet 3  . metFORMIN (GLUCOPHAGE) 500 MG tablet Take 1 tablet (500 mg total) by mouth  2 (two) times daily with a meal. (Patient taking differently: Take 1,000 mg by mouth daily. ) 60 tablet 0  . mometasone-formoterol (DULERA) 200-5 MCG/ACT AERO Inhale 2 puffs into the lungs 2 (two) times daily. (Patient taking differently: Inhale 1 puff into the lungs 2 (two) times daily. ) 1 Inhaler 2  . nicotine (NICODERM CQ - DOSED IN MG/24 HOURS) 14 mg/24hr patch Place 1 patch (14 mg total) onto the skin daily. 28 patch 0  . omeprazole (PRILOSEC) 20 MG capsule Take 1 capsule (20 mg total) by mouth daily. 30 capsule 11  . OVER THE COUNTER MEDICATION Place 1 patch onto the skin as needed (Nicotine therapy).    . sertraline (ZOLOFT) 50 MG tablet Take 1 tablet (50 mg total) by mouth daily. 90 tablet 3  . spironolactone (ALDACTONE) 50 MG tablet Take 1 tablet (50 mg total) by mouth daily. 60 tablet 2  . traMADol (ULTRAM) 50 MG tablet Take 1 tablet (50 mg total) by mouth every 6 (six) hours as needed. 10 tablet 0  . traZODone (DESYREL) 50 MG tablet Take 0.5-1 tablets (25-50 mg total) by mouth at bedtime as needed for sleep. 30 tablet 3  . methocarbamol (ROBAXIN) 500 MG tablet Take 1 tablet (500 mg total) by mouth 2 (two) times daily. 20 tablet 0  . azithromycin (ZITHROMAX) 500 MG tablet Take 1 tablet (500 mg total) by mouth daily. 5 tablet 0  . guaiFENesin (MUCINEX) 600 MG 12 hr tablet Take 2 tablets (1,200 mg total) by mouth 2 (two) times daily. 28 tablet 0   No facility-administered medications prior to visit.     ROS Review of Systems  Constitutional: Negative for activity change, appetite change and fatigue.  HENT: Negative for congestion, sinus pressure and sore throat.   Eyes: Negative for visual disturbance.  Respiratory: Positive for cough, shortness of breath and wheezing. Negative for chest tightness.   Cardiovascular: Negative for chest pain and palpitations.  Gastrointestinal: Negative for abdominal distention, abdominal pain and constipation.  Endocrine: Negative for polydipsia.    Genitourinary: Negative for dysuria and frequency.  Musculoskeletal:       See history of present illness  Skin: Negative for rash.  Neurological: Negative for tremors, light-headedness and numbness.  Hematological: Does not bruise/bleed easily.  Psychiatric/Behavioral: Negative for agitation and behavioral problems.    Objective:  BP 120/77 (BP Location: Left Arm, Patient Position: Sitting, Cuff Size: Large)   Pulse 93   Temp 98.7 F (37.1 C) (Oral)   Resp 18   Ht 5\' 6"  (1.676 m)   Wt (!) 337 lb (152.9 kg)   LMP 09/28/2016 (Exact Date)   SpO2 98%   BMI 54.39 kg/m   BP/Weight 10/10/2016 12/31/7652 10/01/352  Systolic BP 656 812 -  Diastolic BP 77 751 -  Wt. (Lbs) 337 - 338.9  BMI 54.39 - 54.7     Physical Exam  Constitutional: She is oriented to person,  place, and time. She appears well-developed and well-nourished. No distress.  Neck: No JVD present.  Cardiovascular: Normal rate, normal heart sounds and intact distal pulses.   No murmur heard. Pulmonary/Chest: Effort normal. No accessory muscle usage. She has wheezes in the right upper field, the right middle field, the right lower field, the left upper field, the left middle field and the left lower field. She has no rales. She exhibits no tenderness.  Abdominal: Soft. Bowel sounds are normal. She exhibits no distension and no mass. There is no tenderness.  Musculoskeletal: Normal range of motion. She exhibits tenderness (tenderness on palpation of the medial aspect of left knee and on flexion-extension).  Neck: Slight tenderness on palpation of posterior neck and on left to right lateral rotation  Neurological: She is alert and oriented to person, place, and time.  Skin: Skin is warm and dry.  Psychiatric: She has a normal mood and affect.    CMP Latest Ref Rng & Units 10/04/2016 10/03/2016 10/02/2016  Glucose 65 - 99 mg/dL 119(H) 177(H) 145(H)  BUN 6 - 20 mg/dL 10 8 7   Creatinine 0.44 - 1.00 mg/dL 0.77 0.84 0.91  Sodium  135 - 145 mmol/L 137 140 139  Potassium 3.5 - 5.1 mmol/L 3.7 3.5 3.1(L)  Chloride 101 - 111 mmol/L 102 103 104  CO2 22 - 32 mmol/L 26 26 21(L)  Calcium 8.9 - 10.3 mg/dL 8.6(L) 8.3(L) 8.2(L)  Total Protein 6.5 - 8.1 g/dL - 5.7(L) -  Total Bilirubin 0.3 - 1.2 mg/dL - 0.6 -  Alkaline Phos 38 - 126 U/L - 46 -  AST 15 - 41 U/L - 29 -  ALT 14 - 54 U/L - 27 -    CBC    Component Value Date/Time   WBC 21.5 (H) 10/04/2016 0840   RBC 4.82 10/04/2016 0840   HGB 13.0 10/04/2016 0840   HCT 41.4 10/04/2016 0840   PLT 308 10/04/2016 0840   MCV 85.9 10/04/2016 0840   MCH 27.0 10/04/2016 0840   MCHC 31.4 10/04/2016 0840   RDW 18.5 (H) 10/04/2016 0840   LYMPHSABS 2.1 10/02/2016 0131   MONOABS 1.9 (H) 10/02/2016 0131   EOSABS 0.0 10/02/2016 0131   BASOSABS 0.0 10/02/2016 0131     CLINICAL DATA:  The patient felt a pop in the left knee today when standing up with onset of diffuse distal thigh pain. Initial encounter.  EXAM: CT OF THE LEFT KNEE WITHOUT CONTRAST  TECHNIQUE: Multidetector CT imaging of the left knee was performed according to the standard protocol. Multiplanar CT image reconstructions were also generated.  COMPARISON:  None.  FINDINGS: Bones/Joint/Cartilage  There is no acute bony abnormality. The patient has advanced for age tricompartmental osteoarthritis with subchondral sclerosis and osteophytosis appearing worst about the medial and lateral compartments. Medial compartment joint space narrowing is noted. Loose body in the posterior aspect of the joint centrally measures 1 cm in diameter. Mild lateral subluxation of the patella is likely chronic and due to patellar tracking abnormality. No focal bony lesion is identified.  Ligaments  Suboptimally assessed by CT.  Muscles and Tendons  Intact.  Soft tissues  Negative.  IMPRESSION: No acute abnormality.  Markedly advanced for age tricompartmental osteoarthritis.  Please note that the  menisci and ligaments cannot be adequately assessed with CT scan.   Electronically Signed   By: Inge Rise M.D.   On: 10/04/2016 16:55  Assessment & Plan:   1. Morbid obesity with body mass index of 50.0-59.9 in  adult [facility]) Advised on weight loss-reducing portion sizes and physical activity Physical activity is limited by underlying comorbidities.  2. Essential hypertension Controlled Low-sodium diet  3. Moderate persistent asthma with exacerbation Uncontrolled She was not compliant with prescribed dose of prednisone and took 40 mg rather than 60 mg daily Oxygen saturation is 98% but she is audibly wheezing and chest exam reveals same Duo neb treatment, intramuscular Solu-Medrol, intramuscular ceftriaxone administered Levaquin added to regimen Still wheezing after treatment Patient informed to present to the ED if symptoms do not improve in the next couple of hours - cefTRIAXone (ROCEPHIN) injection 500 mg; Inject 500 mg into the muscle once. - methylPREDNISolone sodium succinate (SOLU-MEDROL) 125 mg/2 mL injection 125 mg; Inject 2 mLs (125 mg total) into the muscle once. - albuterol (PROVENTIL) (2.5 MG/3ML) 0.083% nebulizer solution 2.5 mg; Take 3 mLs (2.5 mg total) by nebulization once.  4. Mild episode of recurrent major depressive disorder (HCC) Stable  5. Mild obstructive sleep apnea Noncompliance with CPAP due to claustrophobia  6. Neck pain Likely musculoskeletal Apply heat - methocarbamol (ROBAXIN) 500 MG tablet; Take 1 tablet (500 mg total) by mouth 2 (two) times daily.  Dispense: 60 tablet; Refill: 1  7. Chronic pain of left knee Advanced for age tricompartmental osteoarthritis from CT Morbid obesity largely contributory Received Solu-Medrol today which should help Wll refill tramadol at next visit She may need orthopedic referral at her next visit.  Labs including CMET and CBC at next visit  Meds ordered this encounter  Medications  . cefTRIAXone  (ROCEPHIN) injection 500 mg  . methylPREDNISolone sodium succinate (SOLU-MEDROL) 125 mg/2 mL injection 125 mg  . albuterol (PROVENTIL) (2.5 MG/3ML) 0.083% nebulizer solution 2.5 mg  . DISCONTD: levofloxacin (LEVAQUIN) 750 MG tablet    Sig: Take 1 tablet (750 mg total) by mouth daily.    Dispense:  7 tablet    Refill:  0  . levofloxacin (LEVAQUIN) 750 MG tablet    Sig: Take 1 tablet (750 mg total) by mouth daily.    Dispense:  7 tablet    Refill:  0  . methocarbamol (ROBAXIN) 500 MG tablet    Sig: Take 1 tablet (500 mg total) by mouth 2 (two) times daily.    Dispense:  60 tablet    Refill:  1    Follow-up: Return in about 1 week (around 10/17/2016) for TC : follow up on Asthma.   Arnoldo Morale MD

## 2016-10-11 ENCOUNTER — Ambulatory Visit: Payer: Medicaid Other

## 2016-10-11 ENCOUNTER — Telehealth: Payer: Self-pay

## 2016-10-11 NOTE — Telephone Encounter (Signed)
Attempted to contact the patient again to inform her that the prescription for levaquin was sent to [facility] on [address] and to schedule a follow up appointment. Call placed to # [phone] (M) and a HIPAA compliant voicemail message was left requesting a call back to # [phone]

## 2016-10-11 NOTE — Telephone Encounter (Signed)
Call placed to patient to check on her status. She said that she is feeling " better."  She reported that she slept  " alright" and has not been coughing or wheezing " too much."  She also reported that she has not picked up her levaquin yet, noting that it was not ready in the [place].  Call placed on hold for this CM to check the status at the [facility] pharmacy.  As per the patient's AVS, the prescription was sent to [facility] but the [facility] has not received the prescription as per Katherina Right, Pharmacy Tech.    Call placed to [facility] on [address]. Spoke to [place] who stated that they have the prescription and it should be ready for pick up this afternoon.   The patient had hung up when this CM returned to the phone. Call placed to her again and a HIPAA compliant voicemail message was left for her requesting a call back to # [phone].

## 2016-10-14 ENCOUNTER — Telehealth: Payer: Self-pay | Admitting: Family Medicine

## 2016-10-14 NOTE — Telephone Encounter (Signed)
Called placed to [phone], and left voice message asking patient to call back. Called patient to check on her status and find out if she was able to pick up her prescription. Also, need to schedule an appt for her with Dr. Jarold Song.

## 2016-10-15 ENCOUNTER — Ambulatory Visit: Payer: Medicaid Other | Admitting: Internal Medicine

## 2016-10-15 ENCOUNTER — Telehealth: Payer: Self-pay

## 2016-10-15 NOTE — Telephone Encounter (Signed)
Attempted to contact the patient to check on her and to discuss scheduling a follow up appointment with Dr Jarold Song.  Call placed to [phone] (H) and a HIPAA compliant voicemail message was left requesting a call back to # [phone].

## 2016-10-17 ENCOUNTER — Telehealth: Payer: Self-pay | Admitting: Family Medicine

## 2016-10-17 NOTE — Telephone Encounter (Signed)
[facility] called in referent PT Nebulizer machine she would like to speak with you, please follow up

## 2016-10-18 ENCOUNTER — Emergency Department (HOSPITAL_COMMUNITY): Payer: Medicaid Other

## 2016-10-18 ENCOUNTER — Inpatient Hospital Stay (HOSPITAL_COMMUNITY)
Admission: EM | Admit: 2016-10-18 | Discharge: 2016-10-20 | DRG: 202 | Disposition: A | Discharge status: Home / Self Care | Payer: Medicaid Other | Attending: Internal Medicine | Admitting: Internal Medicine

## 2016-10-18 ENCOUNTER — Encounter (HOSPITAL_COMMUNITY): Payer: Self-pay

## 2016-10-18 DIAGNOSIS — Z79899 Other long term (current) drug therapy: Secondary | ICD-10-CM

## 2016-10-18 DIAGNOSIS — G4700 Insomnia, unspecified: Secondary | ICD-10-CM | POA: Diagnosis present

## 2016-10-18 DIAGNOSIS — M542 Cervicalgia: Secondary | ICD-10-CM

## 2016-10-18 DIAGNOSIS — R519 Headache, unspecified: Secondary | ICD-10-CM

## 2016-10-18 DIAGNOSIS — Z6841 Body Mass Index (BMI) 40.0 and over, adult: Secondary | ICD-10-CM

## 2016-10-18 DIAGNOSIS — D509 Iron deficiency anemia, unspecified: Secondary | ICD-10-CM | POA: Diagnosis present

## 2016-10-18 DIAGNOSIS — Z7984 Long term (current) use of oral hypoglycemic drugs: Secondary | ICD-10-CM

## 2016-10-18 DIAGNOSIS — D649 Anemia, unspecified: Secondary | ICD-10-CM | POA: Diagnosis present

## 2016-10-18 DIAGNOSIS — G4733 Obstructive sleep apnea (adult) (pediatric): Secondary | ICD-10-CM | POA: Diagnosis present

## 2016-10-18 DIAGNOSIS — R51 Headache: Secondary | ICD-10-CM

## 2016-10-18 DIAGNOSIS — Z9119 Patient's noncompliance with other medical treatment and regimen: Secondary | ICD-10-CM

## 2016-10-18 DIAGNOSIS — Z720 Tobacco use: Secondary | ICD-10-CM | POA: Diagnosis present

## 2016-10-18 DIAGNOSIS — K219 Gastro-esophageal reflux disease without esophagitis: Secondary | ICD-10-CM | POA: Diagnosis present

## 2016-10-18 DIAGNOSIS — Z87891 Personal history of nicotine dependence: Secondary | ICD-10-CM

## 2016-10-18 DIAGNOSIS — I10 Essential (primary) hypertension: Secondary | ICD-10-CM | POA: Diagnosis present

## 2016-10-18 DIAGNOSIS — J4551 Severe persistent asthma with (acute) exacerbation: Secondary | ICD-10-CM

## 2016-10-18 DIAGNOSIS — J4541 Moderate persistent asthma with (acute) exacerbation: Secondary | ICD-10-CM | POA: Diagnosis present

## 2016-10-18 DIAGNOSIS — A0472 Enterocolitis due to Clostridium difficile, not specified as recurrent: Secondary | ICD-10-CM | POA: Diagnosis present

## 2016-10-18 DIAGNOSIS — D72829 Elevated white blood cell count, unspecified: Secondary | ICD-10-CM | POA: Diagnosis present

## 2016-10-18 DIAGNOSIS — F329 Major depressive disorder, single episode, unspecified: Secondary | ICD-10-CM | POA: Diagnosis present

## 2016-10-18 DIAGNOSIS — E119 Type 2 diabetes mellitus without complications: Secondary | ICD-10-CM

## 2016-10-18 LAB — CBC WITH DIFFERENTIAL/PLATELET
BASOS PCT: 0 %
Basophils Absolute: 0 10*3/uL (ref 0.0–0.1)
EOS ABS: 0.2 10*3/uL (ref 0.0–0.7)
Eosinophils Relative: 1 %
HCT: 37.3 % (ref 36.0–46.0)
HEMOGLOBIN: 11.5 g/dL — AB (ref 12.0–15.0)
LYMPHS ABS: 3.9 10*3/uL (ref 0.7–4.0)
Lymphocytes Relative: 20 %
MCH: 26.5 pg (ref 26.0–34.0)
MCHC: 30.8 g/dL (ref 30.0–36.0)
MCV: 85.9 fL (ref 78.0–100.0)
Monocytes Absolute: 1.3 10*3/uL — ABNORMAL HIGH (ref 0.1–1.0)
Monocytes Relative: 6 %
NEUTROS PCT: 73 %
Neutro Abs: 14.3 10*3/uL — ABNORMAL HIGH (ref 1.7–7.7)
Platelets: 236 10*3/uL (ref 150–400)
RBC: 4.34 MIL/uL (ref 3.87–5.11)
RDW: 18.9 % — ABNORMAL HIGH (ref 11.5–15.5)
WBC: 19.6 10*3/uL — AB (ref 4.0–10.5)

## 2016-10-18 LAB — BASIC METABOLIC PANEL
ANION GAP: 7 (ref 5–15)
BUN: 5 mg/dL — ABNORMAL LOW (ref 6–20)
CHLORIDE: 104 mmol/L (ref 101–111)
CO2: 26 mmol/L (ref 22–32)
Calcium: 8.5 mg/dL — ABNORMAL LOW (ref 8.9–10.3)
Creatinine, Ser: 0.81 mg/dL (ref 0.44–1.00)
GFR calc non Af Amer: >60 mL/min (ref >60)
Glucose, Bld: 109 mg/dL — ABNORMAL HIGH (ref 65–99)
POTASSIUM: 3.5 mmol/L (ref 3.5–5.1)
SODIUM: 137 mmol/L (ref 135–145)

## 2016-10-18 MED ORDER — MORPHINE SULFATE (PF) 4 MG/ML IV SOLN
2.0000 mg | Freq: Once | INTRAVENOUS | Status: AC
  Administered 2016-10-18: 2 mg via INTRAVENOUS
  Filled 2016-10-18: qty 1

## 2016-10-18 MED ORDER — METHYLPREDNISOLONE SODIUM SUCC 125 MG IJ SOLR
125.0000 mg | Freq: Once | INTRAMUSCULAR | Status: AC
  Administered 2016-10-18: 125 mg via INTRAVENOUS
  Filled 2016-10-18: qty 2

## 2016-10-18 MED ORDER — ALBUTEROL SULFATE (2.5 MG/3ML) 0.083% IN NEBU
5.0000 mg | INHALATION_SOLUTION | Freq: Once | RESPIRATORY_TRACT | Status: AC
  Administered 2016-10-18: 5 mg via RESPIRATORY_TRACT
  Filled 2016-10-18: qty 6

## 2016-10-18 MED ORDER — MAGNESIUM SULFATE 2 GM/50ML IV SOLN
2.0000 g | INTRAVENOUS | Status: AC
  Administered 2016-10-18: 2 g via INTRAVENOUS
  Filled 2016-10-18: qty 50

## 2016-10-18 MED ORDER — IPRATROPIUM BROMIDE 0.02 % IN SOLN
0.5000 mg | Freq: Once | RESPIRATORY_TRACT | Status: AC
  Administered 2016-10-18: 0.5 mg via RESPIRATORY_TRACT
  Filled 2016-10-18: qty 2.5

## 2016-10-18 MED ORDER — POTASSIUM CHLORIDE CRYS ER 20 MEQ PO TBCR
40.0000 meq | EXTENDED_RELEASE_TABLET | Freq: Once | ORAL | Status: AC
  Administered 2016-10-19: 40 meq via ORAL
  Filled 2016-10-18: qty 2

## 2016-10-18 MED ORDER — ALBUTEROL SULFATE (2.5 MG/3ML) 0.083% IN NEBU
INHALATION_SOLUTION | RESPIRATORY_TRACT | Status: AC
  Administered 2016-10-18: 5 mg via RESPIRATORY_TRACT
  Filled 2016-10-18: qty 6

## 2016-10-18 MED ORDER — ALBUTEROL (5 MG/ML) CONTINUOUS INHALATION SOLN
10.0000 mg | INHALATION_SOLUTION | RESPIRATORY_TRACT | Status: AC
  Administered 2016-10-18: 10 mg via RESPIRATORY_TRACT
  Filled 2016-10-18 (×2): qty 20

## 2016-10-18 MED ORDER — IPRATROPIUM BROMIDE 0.02 % IN SOLN
RESPIRATORY_TRACT | Status: AC
Start: 2016-10-18 — End: 2016-10-19
  Filled 2016-10-18: qty 2.5

## 2016-10-18 NOTE — ED Triage Notes (Signed)
Pt complaining of wheezing and SOB x 3 days. Pt states recently hospitalized for same. Pt also complaining of generalized weakness. Pt denies any cough or chest pain.

## 2016-10-18 NOTE — ED Notes (Signed)
Respiratory called for continuous neb 

## 2016-10-18 NOTE — ED Notes (Signed)
RN attempted IV x 1 in left hand per pt request and was unsuccessful.  Pt declines any other attempts by this RN or other pod E nurse.  Requests a "specialist".

## 2016-10-18 NOTE — ED Notes (Signed)
Pt states she had one neb treatment in triage. NAD at this time. Pt states she was just discharged a couple of weeks ago after admission for same. "can't sleep"

## 2016-10-18 NOTE — ED Provider Notes (Signed)
[place] DEPT Provider Note   CSN: [id number] Arrival date & time: 10/18/16  1943     History   Chief Complaint Chief Complaint  Patient presents with  . Shortness of Breath    HPI April [name] is a 46 y.o. female.  46 year old female with a history of asthma, COPD, esophageal reflux, hypertension, morbid obesity, sleep apnea, and depression presents to the emergency department for evaluation of chest tightness. Symptoms associated with wheezing and shortness of breath. She reports worsening symptoms over the past 3 days. Symptoms feel similar to prior asthma exacerbations. Patient last required hospitalization for this on 10/02/2016. She reports using her home ate tropine, albuterol, and Dulera without relief. She is not currently on a course of daily oral steroids. She reports generalized weakness which is worse with activity. activity also worsens her SOB. No associated fevers, syncope, leg swelling, chest pain, cough. She is followed by Dr. Annamaria Boots of Pulmonology; last appointment ~2 months ago, per patient.     Past Medical History:  Diagnosis Date  . Acanthosis nigricans   . Arthritis   . Asthma    Followed by Dr. Annamaria Boots (pulmonology); receives every other week omalizumab injections; has frequent exacerbations  . COPD (chronic obstructive pulmonary disease) ([place])    PFTs in 2002, FEV1/FVC 65, no post bronchodilater test done  . Depression   . GERD (gastroesophageal reflux disease)   . Headache(784.0)   . Helicobacter pylori (H. pylori) infection   . Hypertension, essential   . Insomnia   . Menorrhagia   . Morbid obesity ([place])   . Obesity   . Seasonal allergies   . Shortness of breath   . Sleep apnea    Sleep study 2008 - mild OSA, not enough events to titrate CPAP  . Tobacco user     Patient Active Problem List   Diagnosis Date Noted  . Type 2 diabetes mellitus without complication, without long-term current use of insulin ([place]) 10/02/2016  . Normocytic  anemia 10/02/2016  . Respiratory failure ([place]) 10/02/2016  . Hypertensive urgency, malignant 06/22/2016  . Pulmonary edema 06/22/2016  . Hypertensive urgency 06/22/2016  . 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 Lip laceration 05/05/2016  . Elevated hemoglobin A1c 05/05/2016  . Asthma exacerbation 11/10/2015  . GERD (gastroesophageal reflux disease) 08/30/2015  . Generalized anxiety disorder 08/30/2015  . Seasonal allergic rhinitis 08/29/2013  . Tobacco abuse 10/07/2012  . Asthma, chronic obstructive, without status asthmaticus ([place]) 05/07/2012  . Knee pain, bilateral 04/25/2011  . Primary insomnia 03/14/2011  . Mild obstructive sleep apnea 12/19/2010  . Hypokalemia 08/13/2010  . Cervical back pain with evidence of disc disease 04/08/2008  . Essential hypertension 07/31/2006  . Morbid obesity with body mass index of 50.0-59.9 in adult ([place]) 06/17/2006  . Major depressive disorder, recurrent episode ([place]) 04/10/2006    Past Surgical History:  Procedure Laterality Date  . BREAST REDUCTION SURGERY  09/2011  . TUBAL LIGATION  1996   bilateral    OB History    No data available       Home Medications    Prior to Admission medications   Medication Sig Start Date End Date Taking? Authorizing Provider  albuterol (PROAIR HFA) 108 (90 Base) MCG/ACT inhaler Inhale 2 puffs into the lungs every 6 (six) hours as needed for wheezing or shortness of breath. 10/04/16  Yes Reyne Dumas, MD  albuterol (PROVENTIL) (2.5 MG/3ML) 0.083% nebulizer solution Take 3 mLs (2.5 mg total) by nebulization every 4 (  four) hours as needed for wheezing or shortness of breath. 10/04/16  Yes Reyne Dumas, MD  amLODipine (NORVASC) 10 MG tablet Take 1 tablet (10 mg total) by mouth daily. 05/03/16  Yes Funches, Josalyn, MD  fluticasone (FLONASE) 50 MCG/ACT nasal spray Place 2 sprays into both nostrils daily. Patient taking differently: Place 2 sprays into both nostrils daily as needed  for allergies.  12/27/15  Yes Elsie Stain, MD  furosemide (LASIX) 20 MG tablet Take 1 tablet (20 mg total) by mouth daily. 10/05/16  Yes Reyne Dumas, MD  ipratropium (ATROVENT) 0.02 % nebulizer solution Take 2.5 mLs (0.5 mg total) by nebulization 4 (four) times daily as needed for wheezing or shortness of breath. 06/06/16  Yes Young, Tarri Fuller D, MD  levofloxacin (LEVAQUIN) 750 MG tablet Take 1 tablet (750 mg total) by mouth daily. 10/10/16  Yes Arnoldo Morale, MD  loratadine (CLARITIN) 10 MG tablet Take 1 tablet (10 mg total) by mouth daily. 08/30/15  Yes [name], MD  metFORMIN (GLUCOPHAGE) 500 MG tablet Take 1 tablet (500 mg total) by mouth 2 (two) times daily with a meal. 05/07/16  Yes Vann, Jessica U, DO  methocarbamol (ROBAXIN) 500 MG tablet Take 1 tablet (500 mg total) by mouth 2 (two) times daily. 10/10/16  Yes Arnoldo Morale, MD  mometasone-formoterol (DULERA) 200-5 MCG/ACT AERO Inhale 2 puffs into the lungs 2 (two) times daily. Patient taking differently: Inhale 1 puff into the lungs 2 (two) times daily.  06/06/16  Yes Young, Tarri Fuller D, MD  nicotine (NICODERM CQ - DOSED IN MG/24 HOURS) 14 mg/24hr patch Place 1 patch (14 mg total) onto the skin daily. 10/04/16  Yes Reyne Dumas, MD  omeprazole (PRILOSEC) 20 MG capsule Take 1 capsule (20 mg total) by mouth daily. 10/09/16  Yes Young, Tarri Fuller D, MD  sertraline (ZOLOFT) 50 MG tablet Take 1 tablet (50 mg total) by mouth daily. 08/30/15  Yes [name], MD  spironolactone (ALDACTONE) 50 MG tablet Take 1 tablet (50 mg total) by mouth daily. 10/04/16  Yes Reyne Dumas, MD  traZODone (DESYREL) 50 MG tablet Take 0.5-1 tablets (25-50 mg total) by mouth at bedtime as needed for sleep. 10/04/16  Yes Reyne Dumas, MD  zolpidem (AMBIEN) 10 MG tablet Take 1 tablet (10 mg total) by mouth at bedtime as needed for sleep. 10/10/16 11/09/16 Yes Young, Tarri Fuller D, MD  esomeprazole (NEXIUM) 40 MG capsule Take 1 capsule (40 mg total) by mouth daily. Patient not taking:  Reported on 10/18/2016 06/10/16   Deneise Lever, MD  traMADol (ULTRAM) 50 MG tablet Take 1 tablet (50 mg total) by mouth every 6 (six) hours as needed. Patient not taking: Reported on 10/18/2016 10/04/16   Reyne Dumas, MD    Family History Family History  Problem Relation Age of Onset  . Hypertension Mother   . Asthma Daughter   . Cancer Paternal Aunt   . Asthma Maternal Grandmother     Social History Social History  Substance Use Topics  . Smoking status: Former Smoker    Packs/day: 0.50    Years: 18.00    Types: Cigarettes    Quit date: 04/29/2014  . Smokeless tobacco: Never Used  . Alcohol use No     Allergies   Patient has no known allergies.   Review of Systems Review of Systems Ten systems reviewed and are negative for acute change, except as noted in the HPI.    Physical Exam Updated Vital Signs BP (!) 172/101   Pulse [name])  104   Temp 98.3 F (36.8 C)   Resp (!) 24   LMP 09/28/2016 (Exact Date)   SpO2 100%   Physical Exam  Constitutional: She is oriented to person, place, and time. She appears well-developed and well-nourished. No distress.  Morbidly obese. In no acute distress.  HENT:  Head: Normocephalic and atraumatic.  Eyes: Conjunctivae and EOM are normal. No scleral icterus.  Neck: Normal range of motion.  Cardiovascular: Regular rhythm and intact distal pulses.   Borderline tachycardia  Pulmonary/Chest: No respiratory distress. She has decreased breath sounds (Diffuse). She has wheezes (Minimal bilateral lower lobes, expiratory). She has no rales.  Dyspnea without significant tachypnea. No accessory muscle use. Audible wheeze at bedside. Decreased breath sounds throughout. Chest expansion symmetric.  Musculoskeletal: Normal range of motion.  No bilateral lower extremity edema  Neurological: She is alert and oriented to person, place, and time. She exhibits normal muscle tone. Coordination normal.  GCS 15. Patient moving all extremities.  Skin:  Skin is warm and dry. No rash noted. She is not diaphoretic. No erythema. No pallor.  Psychiatric: She has a normal mood and affect. Her behavior is normal.  Nursing note and vitals reviewed.    ED Treatments / Results  Labs (all labs ordered are listed, but only abnormal results are displayed) Labs Reviewed  CBC WITH DIFFERENTIAL/PLATELET - Abnormal; Notable for the following:       Result Value   WBC 19.6 (*)    Hemoglobin 11.5 (*)    RDW 18.9 (*)    Neutro Abs 14.3 (*)    Monocytes Absolute 1.3 (*)    All other components within normal limits  BASIC METABOLIC PANEL - Abnormal; Notable for the following:    Glucose, Bld 109 (*)    BUN 5 (*)    Calcium 8.5 (*)    All other components within normal limits  BLOOD GAS, VENOUS  I-STAT CG4 LACTIC ACID, ED    EKG  EKG Interpretation None       Radiology Dg Chest 2 View  Result Date: 10/18/2016 CLINICAL DATA:  Acute onset of shortness of breath, dizziness and nausea. Initial encounter. EXAM: CHEST  2 VIEW COMPARISON:  Chest radiograph performed 09/30/2016, and CTA of the chest performed 10/02/2016 FINDINGS: The lungs are well-aerated and clear. There is no evidence of focal opacification, pleural effusion or pneumothorax. The heart is borderline normal in size. No acute osseous abnormalities are seen. IMPRESSION: No acute cardiopulmonary process seen. Electronically Signed   By: Garald Balding M.D.   On: 10/18/2016 20:35   Ct Angio Chest Pe W Or Wo Contrast  Result Date: 10/02/2016 CLINICAL DATA:  Shortness of breath. EXAM: CT ANGIOGRAPHY CHEST WITH CONTRAST TECHNIQUE: Multidetector CT imaging of the chest was performed using the standard protocol during bolus administration of intravenous contrast. Multiplanar CT image reconstructions and MIPs were obtained to evaluate the vascular anatomy. CONTRAST:  100 cc Isovue 370 COMPARISON:  Chest x-ray dated 09/30/2016 and chest CT dated 08/23/2015 FINDINGS: Cardiovascular: No visible  pulmonary emboli. However, opacification of the segmental vessels is suboptimal due to the patient's body habitus. RV LV ratio is normal. Mild cardiomegaly. Mediastinum/Nodes: No enlarged mediastinal, hilar, or axillary lymph nodes. Thyroid gland, trachea, and esophagus demonstrate no significant findings. Lungs/Pleura: Lungs are clear. No pleural effusion or pneumothorax. Upper Abdomen: No acute abnormality. Musculoskeletal: No chest wall abnormality. No acute or significant osseous findings. Review of the MIP images confirms the above findings. IMPRESSION: 1. No large pulmonary emboli.  Opacification of segmental vessels is suboptimal so I cannot exclude small pulmonary emboli. 2. Mild cardiomegaly. 3. Clear lungs. Electronically Signed   By: Lorriane Shire M.D.   On: 10/02/2016 11:12   Procedures Procedures (including critical care time)  Medications Ordered in ED Medications  albuterol (PROVENTIL,VENTOLIN) solution continuous neb (0 mg Nebulization Stopped 10/18/16 2350)  ipratropium (ATROVENT) 0.02 % nebulizer solution (not administered)  potassium chloride SA (K-DUR,KLOR-CON) CR tablet 40 mEq (not administered)  albuterol (PROVENTIL) (2.5 MG/3ML) 0.083% nebulizer solution 5 mg (5 mg Nebulization Given 10/18/16 2237)  methylPREDNISolone sodium succinate (SOLU-MEDROL) 125 mg/2 mL injection 125 mg (125 mg Intravenous Given 10/18/16 2233)  magnesium sulfate IVPB 2 g 50 mL (0 g Intravenous Stopped 10/18/16 2350)  ipratropium (ATROVENT) nebulizer solution 0.5 mg (0.5 mg Nebulization Given 10/18/16 2238)  morphine 4 MG/ML injection 2 mg (2 mg Intravenous Given 10/18/16 2340)    CRITICAL CARE Performed by: Antonietta Breach   Total critical care time: 40 minutes  Critical care time was exclusive of separately billable procedures and treating other patients.  Critical care was necessary to treat or prevent imminent or life-threatening deterioration.  Critical care was time spent personally by me on the  following activities: development of treatment plan with patient and/or surrogate as well as nursing, discussions with consultants, evaluation of patient's response to treatment, examination of patient, obtaining history from patient or surrogate, ordering and performing treatments and interventions, ordering and review of laboratory studies, ordering and review of radiographic studies, pulse oximetry and re-evaluation of patient's condition.   Initial Impression / Assessment and Plan / ED Course  I have reviewed the triage vital signs and the nursing notes.  Pertinent labs & imaging results that were available during my care of the patient were reviewed by me and considered in my medical decision making (see chart for details).     46 year old female presents to the emergency department for asthma exacerbation. She has been managed with a continuous albuterol treatment as well as IV steroids and magnesium. Patient remains tachypneic and dyspneic. She is very symptomatic with ambulation. No hypoxia, but sats do drop from 100% to 93% on room air.  His discussed with pulmonary critical care who advised admission to stepdown for continued albuterol treatments. Critical care will see in consultation. Dr. Jimmy Footman advises admission to [facility]. Consult placed to hospitalist service.  12:30 AM Case discussed with Dr. Tamala Julian who will admit.   Final Clinical Impressions(s) / ED Diagnoses   Final diagnoses:  Severe persistent asthma with exacerbation    New Prescriptions New Prescriptions   No medications on file     Antonietta Breach, Hershal Coria 10/19/16 Ernestina Patches, MD 10/25/16 [phone]

## 2016-10-19 DIAGNOSIS — K219 Gastro-esophageal reflux disease without esophagitis: Secondary | ICD-10-CM | POA: Diagnosis present

## 2016-10-19 DIAGNOSIS — E119 Type 2 diabetes mellitus without complications: Secondary | ICD-10-CM | POA: Diagnosis present

## 2016-10-19 DIAGNOSIS — J4541 Moderate persistent asthma with (acute) exacerbation: Secondary | ICD-10-CM | POA: Diagnosis present

## 2016-10-19 DIAGNOSIS — Z6841 Body Mass Index (BMI) 40.0 and over, adult: Secondary | ICD-10-CM

## 2016-10-19 DIAGNOSIS — Z7984 Long term (current) use of oral hypoglycemic drugs: Secondary | ICD-10-CM | POA: Diagnosis not present

## 2016-10-19 DIAGNOSIS — Z9119 Patient's noncompliance with other medical treatment and regimen: Secondary | ICD-10-CM | POA: Diagnosis not present

## 2016-10-19 DIAGNOSIS — A0472 Enterocolitis due to Clostridium difficile, not specified as recurrent: Secondary | ICD-10-CM | POA: Diagnosis present

## 2016-10-19 DIAGNOSIS — R197 Diarrhea, unspecified: Secondary | ICD-10-CM | POA: Diagnosis not present

## 2016-10-19 DIAGNOSIS — D72829 Elevated white blood cell count, unspecified: Secondary | ICD-10-CM

## 2016-10-19 DIAGNOSIS — G4733 Obstructive sleep apnea (adult) (pediatric): Secondary | ICD-10-CM | POA: Diagnosis present

## 2016-10-19 DIAGNOSIS — Z720 Tobacco use: Secondary | ICD-10-CM | POA: Diagnosis not present

## 2016-10-19 DIAGNOSIS — Z87891 Personal history of nicotine dependence: Secondary | ICD-10-CM | POA: Diagnosis not present

## 2016-10-19 DIAGNOSIS — D649 Anemia, unspecified: Secondary | ICD-10-CM

## 2016-10-19 DIAGNOSIS — G4700 Insomnia, unspecified: Secondary | ICD-10-CM | POA: Diagnosis present

## 2016-10-19 DIAGNOSIS — R0602 Shortness of breath: Secondary | ICD-10-CM | POA: Diagnosis present

## 2016-10-19 DIAGNOSIS — M542 Cervicalgia: Secondary | ICD-10-CM | POA: Diagnosis not present

## 2016-10-19 DIAGNOSIS — J4551 Severe persistent asthma with (acute) exacerbation: Secondary | ICD-10-CM

## 2016-10-19 DIAGNOSIS — I10 Essential (primary) hypertension: Secondary | ICD-10-CM

## 2016-10-19 DIAGNOSIS — F329 Major depressive disorder, single episode, unspecified: Secondary | ICD-10-CM | POA: Diagnosis present

## 2016-10-19 DIAGNOSIS — Z79899 Other long term (current) drug therapy: Secondary | ICD-10-CM | POA: Diagnosis not present

## 2016-10-19 LAB — I-STAT VENOUS BLOOD GAS, ED
ACID-BASE EXCESS: 2 mmol/L (ref 0.0–2.0)
Bicarbonate: 26 mmol/L (ref 20.0–28.0)
O2 Saturation: 97 %
PH VEN: 7.461 — AB (ref 7.250–7.430)
TCO2: 27 mmol/L (ref 0–100)
pCO2, Ven: 36.4 mmHg — ABNORMAL LOW (ref 44.0–60.0)
pO2, Ven: 87 mmHg — ABNORMAL HIGH (ref 32.0–45.0)

## 2016-10-19 LAB — GLUCOSE, CAPILLARY
GLUCOSE-CAPILLARY: 192 mg/dL — AB (ref 65–99)
GLUCOSE-CAPILLARY: 191 mg/dL — AB (ref 65–99)
GLUCOSE-CAPILLARY: 219 mg/dL — AB (ref 65–99)
Glucose-Capillary: 267 mg/dL — ABNORMAL HIGH (ref 65–99)

## 2016-10-19 LAB — I-STAT CG4 LACTIC ACID, ED: LACTIC ACID, VENOUS: 1.74 mmol/L (ref 0.5–1.9)

## 2016-10-19 LAB — C DIFFICILE QUICK SCREEN W PCR REFLEX
C DIFFICILE (CDIFF) TOXIN: NEGATIVE
C Diff antigen: POSITIVE — AB

## 2016-10-19 LAB — CLOSTRIDIUM DIFFICILE BY PCR: Toxigenic C. Difficile by PCR: POSITIVE — AB

## 2016-10-19 MED ORDER — METHYLPREDNISOLONE SODIUM SUCC 125 MG IJ SOLR
60.0000 mg | Freq: Three times a day (TID) | INTRAMUSCULAR | Status: DC
  Administered 2016-10-19 – 2016-10-20 (×5): 60 mg via INTRAVENOUS
  Filled 2016-10-19 (×5): qty 2

## 2016-10-19 MED ORDER — NICOTINE 14 MG/24HR TD PT24
14.0000 mg | MEDICATED_PATCH | Freq: Every day | TRANSDERMAL | Status: DC
  Administered 2016-10-19: 14 mg via TRANSDERMAL
  Filled 2016-10-19 (×2): qty 1

## 2016-10-19 MED ORDER — TRAZODONE HCL 50 MG PO TABS
25.0000 mg | ORAL_TABLET | Freq: Every evening | ORAL | Status: DC | PRN
  Administered 2016-10-19: 50 mg via ORAL
  Filled 2016-10-19: qty 1

## 2016-10-19 MED ORDER — SODIUM CHLORIDE 0.9% FLUSH
3.0000 mL | Freq: Two times a day (BID) | INTRAVENOUS | Status: DC
  Administered 2016-10-19 – 2016-10-20 (×3): 3 mL via INTRAVENOUS

## 2016-10-19 MED ORDER — FUROSEMIDE 20 MG PO TABS
20.0000 mg | ORAL_TABLET | Freq: Every day | ORAL | Status: DC
  Administered 2016-10-19 – 2016-10-20 (×2): 20 mg via ORAL
  Filled 2016-10-19 (×2): qty 1

## 2016-10-19 MED ORDER — METHOCARBAMOL 500 MG PO TABS
500.0000 mg | ORAL_TABLET | Freq: Two times a day (BID) | ORAL | Status: DC | PRN
  Administered 2016-10-19 – 2016-10-20 (×3): 500 mg via ORAL
  Filled 2016-10-19 (×3): qty 1

## 2016-10-19 MED ORDER — AMLODIPINE BESYLATE 10 MG PO TABS
10.0000 mg | ORAL_TABLET | Freq: Every day | ORAL | Status: DC
  Administered 2016-10-19 – 2016-10-20 (×2): 10 mg via ORAL
  Filled 2016-10-19 (×2): qty 1

## 2016-10-19 MED ORDER — INSULIN ASPART 100 UNIT/ML ~~LOC~~ SOLN
0.0000 [IU] | Freq: Three times a day (TID) | SUBCUTANEOUS | Status: DC
  Administered 2016-10-19: 2 [IU] via SUBCUTANEOUS
  Administered 2016-10-19: 5 [IU] via SUBCUTANEOUS
  Administered 2016-10-19: 2 [IU] via SUBCUTANEOUS
  Administered 2016-10-20 (×2): 3 [IU] via SUBCUTANEOUS

## 2016-10-19 MED ORDER — LORATADINE 10 MG PO TABS
10.0000 mg | ORAL_TABLET | Freq: Every day | ORAL | Status: DC
  Administered 2016-10-19 – 2016-10-20 (×2): 10 mg via ORAL
  Filled 2016-10-19 (×2): qty 1

## 2016-10-19 MED ORDER — ENOXAPARIN SODIUM 40 MG/0.4ML ~~LOC~~ SOLN
40.0000 mg | Freq: Every day | SUBCUTANEOUS | Status: DC
  Filled 2016-10-19 (×2): qty 0.4

## 2016-10-19 MED ORDER — ONDANSETRON HCL 4 MG PO TABS: 4.0000 mg | ORAL_TABLET | Freq: Four times a day (QID) | ORAL | Status: DC | PRN

## 2016-10-19 MED ORDER — ARFORMOTEROL TARTRATE 15 MCG/2ML IN NEBU
15.0000 ug | INHALATION_SOLUTION | Freq: Two times a day (BID) | RESPIRATORY_TRACT | Status: DC
  Administered 2016-10-19 – 2016-10-20 (×3): 15 ug via RESPIRATORY_TRACT
  Filled 2016-10-19 (×4): qty 2

## 2016-10-19 MED ORDER — BUDESONIDE 0.5 MG/2ML IN SUSP
0.5000 mg | Freq: Two times a day (BID) | RESPIRATORY_TRACT | Status: DC
  Administered 2016-10-19 – 2016-10-20 (×3): 0.5 mg via RESPIRATORY_TRACT
  Filled 2016-10-19 (×4): qty 2

## 2016-10-19 MED ORDER — HYDRALAZINE HCL 20 MG/ML IJ SOLN
10.0000 mg | Freq: Four times a day (QID) | INTRAMUSCULAR | Status: DC | PRN
  Administered 2016-10-19: 10 mg via INTRAVENOUS
  Filled 2016-10-19: qty 1

## 2016-10-19 MED ORDER — GUAIFENESIN ER 600 MG PO TB12
600.0000 mg | ORAL_TABLET | Freq: Two times a day (BID) | ORAL | Status: DC
  Administered 2016-10-19 – 2016-10-20 (×3): 600 mg via ORAL
  Filled 2016-10-19 (×3): qty 1

## 2016-10-19 MED ORDER — FLUTICASONE PROPIONATE 50 MCG/ACT NA SUSP
2.0000 | Freq: Every day | NASAL | Status: DC | PRN
  Filled 2016-10-19: qty 16

## 2016-10-19 MED ORDER — ZOLPIDEM TARTRATE 5 MG PO TABS
5.0000 mg | ORAL_TABLET | Freq: Every evening | ORAL | Status: DC | PRN
  Administered 2016-10-19 (×2): 5 mg via ORAL
  Filled 2016-10-19 (×3): qty 1

## 2016-10-19 MED ORDER — IPRATROPIUM-ALBUTEROL 0.5-2.5 (3) MG/3ML IN SOLN: 3.0000 mL | RESPIRATORY_TRACT | Status: DC | PRN

## 2016-10-19 MED ORDER — IPRATROPIUM-ALBUTEROL 0.5-2.5 (3) MG/3ML IN SOLN
3.0000 mL | RESPIRATORY_TRACT | Status: DC | PRN
  Administered 2016-10-20: 3 mL via RESPIRATORY_TRACT

## 2016-10-19 MED ORDER — INSULIN ASPART 100 UNIT/ML ~~LOC~~ SOLN
0.0000 [IU] | Freq: Every day | SUBCUTANEOUS | Status: DC
  Administered 2016-10-19: 2 [IU] via SUBCUTANEOUS

## 2016-10-19 MED ORDER — SERTRALINE HCL 50 MG PO TABS
50.0000 mg | ORAL_TABLET | Freq: Every day | ORAL | Status: DC
  Administered 2016-10-19 – 2016-10-20 (×2): 50 mg via ORAL
  Filled 2016-10-19 (×3): qty 1

## 2016-10-19 MED ORDER — ACETAMINOPHEN 325 MG PO TABS
650.0000 mg | ORAL_TABLET | Freq: Four times a day (QID) | ORAL | Status: DC | PRN
  Administered 2016-10-19 – 2016-10-20 (×3): 650 mg via ORAL
  Filled 2016-10-19 (×4): qty 2

## 2016-10-19 MED ORDER — PANTOPRAZOLE SODIUM 40 MG PO TBEC
40.0000 mg | DELAYED_RELEASE_TABLET | Freq: Every day | ORAL | Status: DC
  Administered 2016-10-19: 40 mg via ORAL
  Filled 2016-10-19: qty 1

## 2016-10-19 MED ORDER — ACETAMINOPHEN 650 MG RE SUPP: 650.0000 mg | Freq: Four times a day (QID) | RECTAL | Status: DC | PRN

## 2016-10-19 MED ORDER — IPRATROPIUM-ALBUTEROL 0.5-2.5 (3) MG/3ML IN SOLN
3.0000 mL | RESPIRATORY_TRACT | Status: DC
  Administered 2016-10-19 (×4): 3 mL via RESPIRATORY_TRACT
  Filled 2016-10-19 (×5): qty 3

## 2016-10-19 MED ORDER — LOSARTAN POTASSIUM 50 MG PO TABS
100.0000 mg | ORAL_TABLET | Freq: Every day | ORAL | Status: DC
  Administered 2016-10-19 – 2016-10-20 (×2): 100 mg via ORAL
  Filled 2016-10-19 (×2): qty 2

## 2016-10-19 MED ORDER — ONDANSETRON HCL 4 MG/2ML IJ SOLN
4.0000 mg | Freq: Four times a day (QID) | INTRAMUSCULAR | Status: DC | PRN
Start: 2016-10-19 — End: 2016-10-20
  Filled 2016-10-19: qty 2

## 2016-10-19 MED ORDER — SPIRONOLACTONE 25 MG PO TABS
50.0000 mg | ORAL_TABLET | Freq: Every day | ORAL | Status: DC
  Administered 2016-10-19 – 2016-10-20 (×2): 50 mg via ORAL
  Filled 2016-10-19 (×3): qty 2

## 2016-10-19 MED ORDER — PNEUMOCOCCAL VAC POLYVALENT 25 MCG/0.5ML IJ INJ
0.5000 mL | INJECTION | INTRAMUSCULAR | Status: AC
  Administered 2016-10-20: 0.5 mL via INTRAMUSCULAR
  Filled 2016-10-19: qty 0.5

## 2016-10-19 MED ORDER — IPRATROPIUM-ALBUTEROL 0.5-2.5 (3) MG/3ML IN SOLN
3.0000 mL | Freq: Two times a day (BID) | RESPIRATORY_TRACT | Status: DC
  Filled 2016-10-19: qty 3

## 2016-10-19 MED ORDER — HYDRALAZINE HCL 20 MG/ML IJ SOLN
10.0000 mg | INTRAMUSCULAR | Status: DC | PRN
  Filled 2016-10-19: qty 1

## 2016-10-19 NOTE — Progress Notes (Signed)
[facility]                                                                              Patient Demographics  April [name], is a 46 y.o. female, DOB - 09-Sep-1970, GYJ:[id number]  Admit date - 10/18/2016   Admitting Physician [name], MD  Outpatient Primary MD for the patient is Boykin Nearing, MD  Outpatient specialists:   LOS - 0  days   Medical records reviewed and are as summarized below:    Chief Complaint  Patient presents with  . Shortness of Breath       Brief summary   The patient is a 46 year old female with asthma, morbid obesity, NIDDM, OSA on CPAP  who presented with dyspnea, chest tightness for 4 days. Patient noted chest tightness, wheezing, shortness of breath.   Assessment & Plan    Principal Problem:   Moderate persistent asthma with exacerbation - Still wheezing bilaterally - Appreciate pulmonology recommendations - Continue IV Solu-Medrol, albuterol nebs, scheduled brovana, Pulmicort - She will need to be on PPI, xolair - Outpatient follow-up with pulmonology  Active Problems:   Morbid obesity with body mass index of 50.0-59.9 in adult [facility]) - Patient counseled on diet and weight control    Essential hypertension uncontrolled - BP elevated, in 200s this morning - Continue Norvasc, Lasix, Aldactone, added losartan 100 mg daily - Placed on hydralazine as needed with parameters  Diarrhea - Patient reported diarrhea in the last 1 week and has been on multiple antibiotics in the last few weeks - Follow C. difficile, continue contact precautions    Type 2 diabetes mellitus without complication, without long-term current use of insulin ([facility]) - Continue sliding scale insulin, hold metformin    Normocytic anemia - Continue to monitor H&H, currently at baseline  GERD - Continue PPI  OSA - Noncompliant with CPAP  Tobacco abuse - Counseled strongly on tobacco cessation, continue nicotine patch   Code Status: Full  CODE STATUS  DVT Prophylaxis:  Lovenox Family Communication: Discussed in detail with the patient, all imaging results, lab results explained to the patient   Disposition Plan: Admit to telemetry  Time Spent in minutes  35 minutes  Procedures:  None  Consultants:   Pulmonology  Antimicrobials:      Medications  Scheduled Meds: . amLODipine  10 mg Oral Daily  . arformoterol  15 mcg Nebulization BID  . budesonide (PULMICORT) nebulizer solution  0.5 mg Nebulization BID  . enoxaparin (LOVENOX) injection  40 mg Subcutaneous Daily  . furosemide  20 mg Oral Daily  . guaiFENesin  600 mg Oral BID  . insulin aspart  0-5 Units Subcutaneous QHS  . insulin aspart  0-9 Units Subcutaneous TID WC  . ipratropium-albuterol  3 mL Nebulization Q4H  . loratadine  10 mg Oral Daily  . losartan  100 mg Oral Daily  . methylPREDNISolone (SOLU-MEDROL) injection  60 mg Intravenous Q8H  . nicotine  14 mg Transdermal Daily  . pantoprazole  40 mg Oral Daily  . sertraline  50 mg Oral Daily  . sodium chloride flush  3 mL Intravenous Q12H  . spironolactone  50 mg Oral Daily   Continuous Infusions: PRN Meds:.acetaminophen **OR** acetaminophen, fluticasone, hydrALAZINE, ipratropium-albuterol, methocarbamol, ondansetron **OR** ondansetron (ZOFRAN) IV, traZODone, zolpidem   Antibiotics   Anti-infectives    None        Subjective:   April [name] was seen and examined today.  Reports no further diarrhea this morning, no nausea or vomiting, still wheezing, no chest pain. No fevers or chills. Patient denies dizziness, new weakness, numbess, tingling. No acute events overnight.    Objective:   Vitals:   10/19/16 0630 10/19/16 0830 10/19/16 1055 10/19/16 1331  BP: (!) 188/105 (!) 206/132 (!) 194/100 (!) 198/130  Pulse: 87 98 80 76  Resp:  (!) 22  20  Temp:  98.1 F (36.7 C)  98.6 F (37 C)  TempSrc:  Oral  Oral  SpO2: 96% 100%  100%  Weight:  (!) 149.7 kg (330 lb)    Height:  5\' 6"  (1.676 m)       Intake/Output Summary (Last 24 hours) at 10/19/16 1424 Last data filed at 10/19/16 1332  Gross per 24 hour  Intake              480 ml  Output                0 ml  Net              480 ml     Wt Readings from Last 3 Encounters:  10/19/16 (!) 149.7 kg (330 lb)  10/10/16 (!) 152.9 kg (337 lb)  10/02/16 (!) 153.7 kg (338 lb 14.4 oz)     Exam  General: Alert and oriented x 3, NAD  Eyes: PERRLA, EOMI, Anicteric Sclera,  HEENT:  Atraumatic, normocephalic, normal oropharynx  Cardiovascular: S1 S2 auscultated, no rubs, murmurs or gallops. Regular rate and rhythm.  Respiratory:Bilateral expiratory wheezing   Gastrointestinal:Obese  Soft, nontender, nondistended, + bowel sounds  Ext: no pedal edema bilaterally  Neuro: AAOx3, Cr N's II- XII. Strength 5/5 upper and lower extremities bilaterally, speech clear, sensations grossly intact  Musculoskeletal: No digital cyanosis, clubbing  Skin: No rashes  Psych: Normal affect and demeanor, alert and oriented x3    Data Reviewed:  I have personally reviewed following labs and imaging studies  Micro Results No results found for this or any previous visit (from the past 240 hour(s)).  Radiology Reports Dg Chest 2 View  Result Date: 10/18/2016 CLINICAL DATA:  Acute onset of shortness of breath, dizziness and nausea. Initial encounter. EXAM: CHEST  2 VIEW COMPARISON:  Chest radiograph performed 09/30/2016, and CTA of the chest performed 10/02/2016 FINDINGS: The lungs are well-aerated and clear. There is no evidence of focal opacification, pleural effusion or pneumothorax. The heart is borderline normal in size. No acute osseous abnormalities are seen. IMPRESSION: No acute cardiopulmonary process seen. Electronically Signed   By: Garald Balding M.D.   On: 10/18/2016 20:35   Dg Chest 2 View  Result Date: 09/30/2016 CLINICAL DATA:  Worsening asthma EXAM: CHEST  2 VIEW COMPARISON:  06/21/2016 FINDINGS: The heart size and mediastinal  contours are within normal limits. Both lungs are clear. The visualized skeletal structures are unremarkable. IMPRESSION: No active cardiopulmonary disease. Electronically Signed   By: Kerby Moors M.D.   On: 09/30/2016 13:18   Ct Angio Chest Pe W Or Wo Contrast  Result Date: 10/02/2016 CLINICAL DATA:  Shortness of breath. EXAM: CT ANGIOGRAPHY CHEST WITH CONTRAST TECHNIQUE: Multidetector CT imaging of the chest was performed using the standard  protocol during bolus administration of intravenous contrast. Multiplanar CT image reconstructions and MIPs were obtained to evaluate the vascular anatomy. CONTRAST:  100 cc Isovue 370 COMPARISON:  Chest x-ray dated 09/30/2016 and chest CT dated 08/23/2015 FINDINGS: Cardiovascular: No visible pulmonary emboli. However, opacification of the segmental vessels is suboptimal due to the patient's body habitus. RV LV ratio is normal. Mild cardiomegaly. Mediastinum/Nodes: No enlarged mediastinal, hilar, or axillary lymph nodes. Thyroid gland, trachea, and esophagus demonstrate no significant findings. Lungs/Pleura: Lungs are clear. No pleural effusion or pneumothorax. Upper Abdomen: No acute abnormality. Musculoskeletal: No chest wall abnormality. No acute or significant osseous findings. Review of the MIP images confirms the above findings. IMPRESSION: 1. No large pulmonary emboli. Opacification of segmental vessels is suboptimal so I cannot exclude small pulmonary emboli. 2. Mild cardiomegaly. 3. Clear lungs. Electronically Signed   By: Lorriane Shire M.D.   On: 10/02/2016 11:12   Ct Knee Left Wo Contrast  Result Date: 10/04/2016 CLINICAL DATA:  The patient felt a pop in the left knee today when standing up with onset of diffuse distal thigh pain. Initial encounter. EXAM: CT OF THE LEFT KNEE WITHOUT CONTRAST TECHNIQUE: Multidetector CT imaging of the left knee was performed according to the standard protocol. Multiplanar CT image reconstructions were also generated.  COMPARISON:  None. FINDINGS: Bones/Joint/Cartilage There is no acute bony abnormality. The patient has advanced for age tricompartmental osteoarthritis with subchondral sclerosis and osteophytosis appearing worst about the medial and lateral compartments. Medial compartment joint space narrowing is noted. Loose body in the posterior aspect of the joint centrally measures 1 cm in diameter. Mild lateral subluxation of the patella is likely chronic and due to patellar tracking abnormality. No focal bony lesion is identified. Ligaments Suboptimally assessed by CT. Muscles and Tendons Intact. Soft tissues Negative. IMPRESSION: No acute abnormality. Markedly advanced for age tricompartmental osteoarthritis. Please note that the menisci and ligaments cannot be adequately assessed with CT scan. Electronically Signed   By: Inge Rise M.D.   On: 10/04/2016 16:55    Lab Data:  CBC:  Recent Labs Lab 10/18/16 2314  WBC 19.6*  NEUTROABS 14.3*  HGB 11.5*  HCT 37.3  MCV 85.9  PLT 720   Basic Metabolic Panel:  Recent Labs Lab 10/18/16 2314  NA 137  K 3.5  CL 104  CO2 26  GLUCOSE 109*  BUN 5*  CREATININE 0.81  CALCIUM 8.5*   GFR: Estimated Creatinine Clearance: 135 mL/min (by C-G formula based on SCr of 0.81 mg/dL). Liver Function Tests: No results for input(s): AST, ALT, ALKPHOS, BILITOT, PROT, ALBUMIN in the last 168 hours. No results for input(s): LIPASE, AMYLASE in the last 168 hours. No results for input(s): AMMONIA in the last 168 hours. Coagulation Profile: No results for input(s): INR, PROTIME in the last 168 hours. Cardiac Enzymes: No results for input(s): CKTOTAL, CKMB, CKMBINDEX, TROPONINI in the last 168 hours. BNP (last 3 results) No results for input(s): PROBNP in the last 8760 hours. HbA1C: No results for input(s): HGBA1C in the last 72 hours. CBG:  Recent Labs Lab 10/19/16 0923 10/19/16 1157  GLUCAP 267* 192*   Lipid Profile: No results for input(s): CHOL,  HDL, LDLCALC, TRIG, CHOLHDL, LDLDIRECT in the last 72 hours. Thyroid Function Tests: No results for input(s): TSH, T4TOTAL, FREET4, T3FREE, THYROIDAB in the last 72 hours. Anemia Panel: No results for input(s): VITAMINB12, FOLATE, FERRITIN, TIBC, IRON, RETICCTPCT in the last 72 hours. Urine analysis:    Component Value Date/Time   COLORURINE  YELLOW 11/21/2014 0707   APPEARANCEUR CLOUDY (A) 11/21/2014 0707   LABSPEC 1.024 11/21/2014 0707   PHURINE 6.0 11/21/2014 0707   GLUCOSEU NEGATIVE 11/21/2014 0707   GLUCOSEU NEG mg/dL 10/28/2007 2049   HGBUR TRACE (A) 11/21/2014 0707   BILIRUBINUR NEGATIVE 11/21/2014 0707   KETONESUR NEGATIVE 11/21/2014 0707   PROTEINUR NEGATIVE 11/21/2014 0707   UROBILINOGEN 1.0 11/21/2014 0707   NITRITE NEGATIVE 11/21/2014 0707   LEUKOCYTESUR NEGATIVE 11/21/2014 0707     Lizandro Spellman M.D. [facility] 10/19/2016, 2:24 PM  Pager: [phone] Between 7am to 7pm - call Pager - [phone]  After 7pm go to [email or web address] - password [email or web address]  Call night coverage person covering after 7pm

## 2016-10-19 NOTE — H&P (Signed)
History and Physical    April [name]:[id number] DOB: [birth year] DOA: 10/18/2016  Referring MD/NP/PA: Antonietta Breach, PA-C PCP: Boykin Nearing, MD  Patient coming from: Home  Chief Complaint: Shortness of breath  HPI: April [name] is a 46 y.o. female with medical history significant of moderate persistent asthma followed by Dr. Annamaria Boots of pulmonology, asthma, morbid obesity, GERD, OSA noncompliant with CPAP due to claustrophobia; who presents with complaints of worsening shortness of breath over the last 3 days . Patient reports that symptoms of shortness of breath have seemed to wax and wane. She reports having a dry cough and wheezing. Any kind of activity significantly worsens shortness of breath. Hospitalized for similar symptoms on 10/02/2016 for which she was treated with azithromycin and steroids. She had follow up with her primary care provider in the last week, and had been started on Levaquin for which the patient notes that she had 2 more doses left to take. Patient just completed a course of steroids 4 days ago. Patient reports using her inhalers at home without relief of symptoms. Complains of associated symptoms of chest tightness, diarrhea(since yesterday every hour), and generalized malaise. Denies having any significant fever, chills, nausea, vomiting, dysuria. Her last followed up with her pulmonologist approximately 2 months ago. Patient requests to be placed on a regular diet while here in the hospital. She reports that she does not smoke currently and request a nicotine patch.  ED Course: Upon admission into the emergency department patient was seen to be afebrile, heart rates 95-111, respirations 20-24, blood pressure is elevated up to 178/95, and O2 saturation appeared to be maintained on room air. Patient was given continuous albuterol, Solu-Medrol, and magnesium sulfate. Chest x-ray was otherwise noted to be clear signs of infection. [facility] called to admit.  Review of Systems: As per  HPI otherwise 10 point review of systems negative.   Past Medical History:  Diagnosis Date  . Acanthosis nigricans   . Arthritis   . Asthma    Followed by Dr. Annamaria Boots (pulmonology); receives every other week omalizumab injections; has frequent exacerbations  . COPD (chronic obstructive pulmonary disease) ([place])    PFTs in 2002, FEV1/FVC 65, no post bronchodilater test done  . Depression   . GERD (gastroesophageal reflux disease)   . Headache(784.0)   . Helicobacter pylori (H. pylori) infection   . Hypertension, essential   . Insomnia   . Menorrhagia   . Morbid obesity ([place])   . Obesity   . Seasonal allergies   . Shortness of breath   . Sleep apnea    Sleep study 2008 - mild OSA, not enough events to titrate CPAP  . Tobacco user     Past Surgical History:  Procedure Laterality Date  . BREAST REDUCTION SURGERY  09/2011  . TUBAL LIGATION  1996   bilateral     reports that she quit smoking about 2 years ago. Her smoking use included Cigarettes. She has a 9.00 pack-year smoking history. She has never used smokeless tobacco. She reports that she does not drink alcohol or use drugs.  No Known Allergies  Family History  Problem Relation Age of Onset  . Hypertension Mother   . Asthma Daughter   . Cancer Paternal Aunt   . Asthma Maternal Grandmother     Prior to Admission medications   Medication Sig Start Date End Date Taking? Authorizing Provider  albuterol (PROAIR HFA) 108 (90 Base) MCG/ACT inhaler Inhale 2 puffs into the lungs every 6 (six)  hours as needed for wheezing or shortness of breath. 10/04/16  Yes Reyne Dumas, MD  albuterol (PROVENTIL) (2.5 MG/3ML) 0.083% nebulizer solution Take 3 mLs (2.5 mg total) by nebulization every 4 (four) hours as needed for wheezing or shortness of breath. 10/04/16  Yes Reyne Dumas, MD  amLODipine (NORVASC) 10 MG tablet Take 1 tablet (10 mg total) by mouth daily. 05/03/16  Yes Funches, Josalyn, MD  fluticasone (FLONASE) 50 MCG/ACT nasal spray  Place 2 sprays into both nostrils daily. Patient taking differently: Place 2 sprays into both nostrils daily as needed for allergies.  12/27/15  Yes Elsie Stain, MD  furosemide (LASIX) 20 MG tablet Take 1 tablet (20 mg total) by mouth daily. 10/05/16  Yes Reyne Dumas, MD  ipratropium (ATROVENT) 0.02 % nebulizer solution Take 2.5 mLs (0.5 mg total) by nebulization 4 (four) times daily as needed for wheezing or shortness of breath. 06/06/16  Yes Young, Tarri Fuller D, MD  levofloxacin (LEVAQUIN) 750 MG tablet Take 1 tablet (750 mg total) by mouth daily. 10/10/16  Yes Arnoldo Morale, MD  loratadine (CLARITIN) 10 MG tablet Take 1 tablet (10 mg total) by mouth daily. 08/30/15  Yes [name], MD  metFORMIN (GLUCOPHAGE) 500 MG tablet Take 1 tablet (500 mg total) by mouth 2 (two) times daily with a meal. 05/07/16  Yes Vann, Jessica U, DO  methocarbamol (ROBAXIN) 500 MG tablet Take 1 tablet (500 mg total) by mouth 2 (two) times daily. 10/10/16  Yes Arnoldo Morale, MD  mometasone-formoterol (DULERA) 200-5 MCG/ACT AERO Inhale 2 puffs into the lungs 2 (two) times daily. Patient taking differently: Inhale 1 puff into the lungs 2 (two) times daily.  06/06/16  Yes Young, Tarri Fuller D, MD  nicotine (NICODERM CQ - DOSED IN MG/24 HOURS) 14 mg/24hr patch Place 1 patch (14 mg total) onto the skin daily. 10/04/16  Yes Reyne Dumas, MD  omeprazole (PRILOSEC) 20 MG capsule Take 1 capsule (20 mg total) by mouth daily. 10/09/16  Yes Young, Tarri Fuller D, MD  sertraline (ZOLOFT) 50 MG tablet Take 1 tablet (50 mg total) by mouth daily. 08/30/15  Yes [name], MD  spironolactone (ALDACTONE) 50 MG tablet Take 1 tablet (50 mg total) by mouth daily. 10/04/16  Yes Reyne Dumas, MD  traZODone (DESYREL) 50 MG tablet Take 0.5-1 tablets (25-50 mg total) by mouth at bedtime as needed for sleep. 10/04/16  Yes Reyne Dumas, MD  zolpidem (AMBIEN) 10 MG tablet Take 1 tablet (10 mg total) by mouth at bedtime as needed for sleep. 10/10/16 11/09/16 Yes Young,  Tarri Fuller D, MD  esomeprazole (NEXIUM) 40 MG capsule Take 1 capsule (40 mg total) by mouth daily. Patient not taking: Reported on 10/18/2016 06/10/16   Deneise Lever, MD  traMADol (ULTRAM) 50 MG tablet Take 1 tablet (50 mg total) by mouth every 6 (six) hours as needed. Patient not taking: Reported on 10/18/2016 10/04/16   Reyne Dumas, MD    Physical Exam:    Constitutional: Obese female who appears to be in moderate respiratory distress Vitals:   10/18/16 2230 10/18/16 2300 10/18/16 2315 10/18/16 2330  BP: (!) 178/95 (!) 156/95 (!) 150/99 (!) 172/101  Pulse: 95 (!) 101 100 (!) 104  Resp:      Temp:      SpO2: 100% 100% 100% 100%   Eyes: PERRL, lids and conjunctivae normal ENMT: Mucous membranes are moist. Posterior pharynx clear of any exudate or lesions. Normal dentition.  Neck: normal, supple, no masses, no thyromegaly Respiratory: Tachypneic with decreased  overall aeration and expiratory wheezes. Patient speaking in shorten sentence Cardiovascular: Tachycardic no murmurs / rubs / gallops. No extremity edema. 2+ pedal pulses. No carotid bruits.  Abdomen: no tenderness, no masses palpated. No hepatosplenomegaly. Bowel sounds positive.  Musculoskeletal: no clubbing / cyanosis. No joint deformity upper and lower extremities. Good ROM, no contractures. Normal muscle tone.  Skin: no rashes, lesions, ulcers. No induration Neurologic: CN 2-12 grossly intact. Sensation intact, DTR normal. Strength 5/5 in all 4.  Psychiatric: Normal judgment and insight. Alert and oriented x 3. Normal mood.     Labs on Admission: I have personally reviewed following labs and imaging studies  CBC:  Recent Labs Lab 10/18/16 2314  WBC 19.6*  NEUTROABS 14.3*  HGB 11.5*  HCT 37.3  MCV 85.9  PLT 283   Basic Metabolic Panel:  Recent Labs Lab 10/18/16 2314  NA 137  K 3.5  CL 104  CO2 26  GLUCOSE 109*  BUN 5*  CREATININE 0.81  CALCIUM 8.5*   GFR: Estimated Creatinine Clearance: 136.7  mL/min (by C-G formula based on SCr of 0.81 mg/dL). Liver Function Tests: No results for input(s): AST, ALT, ALKPHOS, BILITOT, PROT, ALBUMIN in the last 168 hours. No results for input(s): LIPASE, AMYLASE in the last 168 hours. No results for input(s): AMMONIA in the last 168 hours. Coagulation Profile: No results for input(s): INR, PROTIME in the last 168 hours. Cardiac Enzymes: No results for input(s): CKTOTAL, CKMB, CKMBINDEX, TROPONINI in the last 168 hours. BNP (last 3 results) No results for input(s): PROBNP in the last 8760 hours. HbA1C: No results for input(s): HGBA1C in the last 72 hours. CBG: No results for input(s): GLUCAP in the last 168 hours. Lipid Profile: No results for input(s): CHOL, HDL, LDLCALC, TRIG, CHOLHDL, LDLDIRECT in the last 72 hours. Thyroid Function Tests: No results for input(s): TSH, T4TOTAL, FREET4, T3FREE, THYROIDAB in the last 72 hours. Anemia Panel: No results for input(s): VITAMINB12, FOLATE, FERRITIN, TIBC, IRON, RETICCTPCT in the last 72 hours. Urine analysis:    Component Value Date/Time   COLORURINE YELLOW 11/21/2014 0707   APPEARANCEUR CLOUDY (A) 11/21/2014 0707   LABSPEC 1.024 11/21/2014 0707   PHURINE 6.0 11/21/2014 0707   GLUCOSEU NEGATIVE 11/21/2014 0707   GLUCOSEU NEG mg/dL 10/28/2007 2049   HGBUR TRACE (A) 11/21/2014 0707   BILIRUBINUR NEGATIVE 11/21/2014 0707   KETONESUR NEGATIVE 11/21/2014 0707   PROTEINUR NEGATIVE 11/21/2014 0707   UROBILINOGEN 1.0 11/21/2014 0707   NITRITE NEGATIVE 11/21/2014 0707   LEUKOCYTESUR NEGATIVE 11/21/2014 0707   Sepsis Labs: No results found for this or any previous visit (from the past 240 hour(s)).   Radiological Exams on Admission: Dg Chest 2 View  Result Date: 10/18/2016 CLINICAL DATA:  Acute onset of shortness of breath, dizziness and nausea. Initial encounter. EXAM: CHEST  2 VIEW COMPARISON:  Chest radiograph performed 09/30/2016, and CTA of the chest performed 10/02/2016 FINDINGS: The  lungs are well-aerated and clear. There is no evidence of focal opacification, pleural effusion or pneumothorax. The heart is borderline normal in size. No acute osseous abnormalities are seen. IMPRESSION: No acute cardiopulmonary process seen. Electronically Signed   By: Garald Balding M.D.   On: 10/18/2016 20:35    EKG: Independently reviewed. Sinus tachycardia 106 bpm  Assessment/Plan Moderate persistent asthma with exacerbation: Acute. Patient presents with complaints of shortness of breath with wheezing appreciated with physical exam. She  was recently hospitalized on 6/6 for similar. Patient has recently been on antibiotics of Levaquin. No acute infiltrate  seen on chest x-ray. - Admit to stepdown - Peak flow monitoring - Check respiratory virus panel - DuoNeb's q 4hr and prn q2 hr - Brovana and Budesonide neb q12hr - Solumedrol 60mg  IV q 8hrs - Peak flow readings  - Continue Claritin and Flonase - PCCM consulted while in the ED, follow-up for further recommendations  Leukocytosis: Chronic. WBC elevated to 19.6, but appears to be trending downward on looking at previous values. Question if secondary to steroids.  - Continue to monitor  Diabetes mellitus type 2: Last hemoglobin A1c on file is 6.4 on 05/03/2016. Patient currently only on oral medications of metformin at home. Blood sugars appear stable at this time at 109. - Hypoglycemic protocol  - Hold metformin  - CBGs with sensitive sliding scale insulin  Diarrhea: Patient reports having a bowel movement every hour since yesterday. Patient has been on multiple antibiotics over the last few weeks. - Check C. difficile  Essential hypertension - Continue amlodipine, Lasix, furosemide     Morbid obesity  Normocytic normochromic anemia: Hemoglobin 11.5 which appears near patient's baseline. - Continue to monitor  GERD - Pharmacy substitution of Protonix   Morbid obesity: BMI 51.8  OSA noncompliant with CPAP  Tobacco  abuse - Nicotine patch  - Counseled on the need a succession of tobacco use  DVT prophylaxis: Lovenox Code Status: Full Family Communication: None Disposition Plan: Likely discharge home in 2-3 days Consults called: PCCM Admission status: Inpatient  [name] MD [facility] Pager [phone]  If 7PM-7AM, please contact night-coverage [email or web address] Password [facility]  10/19/2016, 12:27 AM

## 2016-10-19 NOTE — Progress Notes (Signed)
Patient asked RN if she would be getting any scans of her head.  When RN asked why, Patient stated that she had a fall 3-4 days ago and her head hurts where she hit it.  Patient is neurologically intact and alert and oriented x 4.  On-call MD Opyd notified and called RN and advised that patient tell her hospitalist in the Morning.  Will continue to monitor Patient and notify MD as needed.

## 2016-10-19 NOTE — Progress Notes (Signed)
Pt admitted to the unit at 0830. Pt mental status is A&O x 4. Pt oriented to room, staff, and call bell. Skin is intact except where otherwise charted. Full assessment charted in [facility]. Call bell within reach. Visitor guidelines reviewed w/ pt and/or family.

## 2016-10-19 NOTE — Consult Note (Signed)
Name: April [name] MRN: [id number] DOB: 09-24-1970    ADMISSION DATE:  10/18/2016 CONSULTATION DATE:  10/19/2016  REFERRING MD :  Dr. Tamala Julian, [facility]  CHIEF COMPLAINT:  Dyspnea, wheezing  SIGNIFICANT EVENTS   STUDIES:  Spirometry 07/29/16  >> FEV1 1.53 L (57% predicted) Echocardiogram 10/03/16 >> mild LVH, LVEF 55-60%, no wall motion abnormalities, grade 1 diastolic dysfunction, normal right ventricular size and function, estimated PASP 75mmHg   HISTORY OF PRESENT ILLNESS:  46 year old morbidly obese former smoker followed by Dr. Annamaria Boots in our office for moderate persistent asthma for which she has been treated with Aggie Cosier, albuterol. Contributing factors have been felt to be allergic rhinitis, currently on loratadine daily and fluticasone nasal spray when necessary; GERD with breakthrough symptoms on PPI; obesity. Also with a history of untreated obstructive sleep apnea. She has persistent symptoms, frequent emergency room visits. She has been on and off prednisone for the last month, both prednisone tapers and also symptom driven prednisone from our office. She has not had a Xolair treatment for over 2 months due to transportation difficulties. She can't identify any clear precipitating symptoms other than some increased nasal congestion. She denies any other overt URI or viral symptoms.  PAST MEDICAL HISTORY :   has a past medical history of Acanthosis nigricans; Arthritis; Asthma; COPD (chronic obstructive pulmonary disease) ([place]); Depression; GERD (gastroesophageal reflux disease); Headache(784.0); Helicobacter pylori (H. pylori) infection; Hypertension, essential; Insomnia; Menorrhagia; Morbid obesity ([place]); Obesity; Seasonal allergies; Shortness of breath; Sleep apnea; and Tobacco user.  has a past surgical history that includes Tubal ligation (1996) and Breast reduction surgery (09/2011). Prior to Admission medications   Medication Sig Start Date End Date Taking? Authorizing Provider    albuterol (PROAIR HFA) 108 (90 Base) MCG/ACT inhaler Inhale 2 puffs into the lungs every 6 (six) hours as needed for wheezing or shortness of breath. 10/04/16  Yes Reyne Dumas, MD  albuterol (PROVENTIL) (2.5 MG/3ML) 0.083% nebulizer solution Take 3 mLs (2.5 mg total) by nebulization every 4 (four) hours as needed for wheezing or shortness of breath. 10/04/16  Yes Reyne Dumas, MD  amLODipine (NORVASC) 10 MG tablet Take 1 tablet (10 mg total) by mouth daily. 05/03/16  Yes Funches, Josalyn, MD  fluticasone (FLONASE) 50 MCG/ACT nasal spray Place 2 sprays into both nostrils daily. Patient taking differently: Place 2 sprays into both nostrils daily as needed for allergies.  12/27/15  Yes Elsie Stain, MD  furosemide (LASIX) 20 MG tablet Take 1 tablet (20 mg total) by mouth daily. 10/05/16  Yes Reyne Dumas, MD  ipratropium (ATROVENT) 0.02 % nebulizer solution Take 2.5 mLs (0.5 mg total) by nebulization 4 (four) times daily as needed for wheezing or shortness of breath. 06/06/16  Yes Young, Tarri Fuller D, MD  levofloxacin (LEVAQUIN) 750 MG tablet Take 1 tablet (750 mg total) by mouth daily. 10/10/16  Yes Arnoldo Morale, MD  loratadine (CLARITIN) 10 MG tablet Take 1 tablet (10 mg total) by mouth daily. 08/30/15  Yes [name], MD  metFORMIN (GLUCOPHAGE) 500 MG tablet Take 1 tablet (500 mg total) by mouth 2 (two) times daily with a meal. 05/07/16  Yes Vann, Jessica U, DO  methocarbamol (ROBAXIN) 500 MG tablet Take 1 tablet (500 mg total) by mouth 2 (two) times daily. 10/10/16  Yes Arnoldo Morale, MD  mometasone-formoterol (DULERA) 200-5 MCG/ACT AERO Inhale 2 puffs into the lungs 2 (two) times daily. Patient taking differently: Inhale 1 puff into the lungs 2 (two) times daily.  06/06/16  Yes  Baird Lyons D, MD  nicotine (NICODERM CQ - DOSED IN MG/24 HOURS) 14 mg/24hr patch Place 1 patch (14 mg total) onto the skin daily. 10/04/16  Yes Reyne Dumas, MD  omeprazole (PRILOSEC) 20 MG capsule Take 1 capsule (20 mg total) by  mouth daily. 10/09/16  Yes Young, Tarri Fuller D, MD  sertraline (ZOLOFT) 50 MG tablet Take 1 tablet (50 mg total) by mouth daily. 08/30/15  Yes [name], MD  spironolactone (ALDACTONE) 50 MG tablet Take 1 tablet (50 mg total) by mouth daily. 10/04/16  Yes Reyne Dumas, MD  traZODone (DESYREL) 50 MG tablet Take 0.5-1 tablets (25-50 mg total) by mouth at bedtime as needed for sleep. 10/04/16  Yes Reyne Dumas, MD  zolpidem (AMBIEN) 10 MG tablet Take 1 tablet (10 mg total) by mouth at bedtime as needed for sleep. 10/10/16 11/09/16 Yes Young, Tarri Fuller D, MD  esomeprazole (NEXIUM) 40 MG capsule Take 1 capsule (40 mg total) by mouth daily. Patient not taking: Reported on 10/18/2016 06/10/16   Deneise Lever, MD  traMADol (ULTRAM) 50 MG tablet Take 1 tablet (50 mg total) by mouth every 6 (six) hours as needed. Patient not taking: Reported on 10/18/2016 10/04/16   Reyne Dumas, MD   No Known Allergies  FAMILY HISTORY:  family history includes Asthma in her daughter and maternal grandmother; Cancer in her paternal aunt; Hypertension in her mother. SOCIAL HISTORY:  reports that she quit smoking about 2 years ago. Her smoking use included Cigarettes. She has a 9.00 pack-year smoking history. She has never used smokeless tobacco. She reports that she does not drink alcohol or use drugs.  REVIEW OF SYSTEMS:   Positives in bold Constitutional: Negative for fever, chills, weight loss, malaise/fatigue and diaphoresis.  HENT: Negative for hearing loss, ear pain, nosebleeds, congestion, sore throat, neck pain, tinnitus and ear discharge.   Eyes: Negative for blurred vision, double vision, photophobia, pain, discharge and redness.  Respiratory: cough, no hemoptysis, sputum production, shortness of breath, wheezing and stridor.   Cardiovascular: Negative for chest pain, palpitations, orthopnea, claudication, leg swelling and PND.  Gastrointestinal: Negative for heartburn, nausea, vomiting, abdominal pain, diarrhea,  constipation, blood in stool and melena.  Genitourinary: Negative for dysuria, urgency, frequency, hematuria and flank pain.  Musculoskeletal: Negative for myalgias, back pain, joint pain and falls.  Skin: Negative for itching and rash.  Neurological: Negative for dizziness, tingling, tremors, sensory change, speech change, focal weakness, seizures, loss of consciousness, weakness and headaches.  Endo/Heme/Allergies: Negative for environmental allergies and polydipsia. Does not bruise/bleed easily.  SUBJECTIVE:  Starting feel a bit better after nebulizer treatment in the ED  VITAL SIGNS: Temp:  [98.3 F (36.8 C)] 98.3 F (36.8 C) (06/22 2002) Pulse Rate:  [95-111] 106 (06/23 0030) Resp:  [22-24] 24 (06/22 2104) BP: (150-178)/(68-114) 164/93 (06/23 0030) SpO2:  [95 %-100 %] 96 % (06/23 0030)  PHYSICAL EXAMINATION: General:  Morbidly obese woman, no distress Neuro:  Awake, alert, interacting, nonfocal exam HEENT:  Large neck, stridor present, oropharynx clear, pupils equal reactive Cardiovascular:  Distant heart sounds, regular, no murmur, trace bilateral pretibial edema Lungs:  Some referred upper airway noise and also probably some mild bilateral expiratory wheezes Abdomen:  Obese, soft, nontender, positive bowel sounds Musculoskeletal:  No deformities Skin:  No rash   Recent Labs Lab 10/18/16 2314  NA 137  K 3.5  CL 104  CO2 26  BUN 5*  CREATININE 0.81  GLUCOSE 109*    Recent Labs Lab 10/18/16 2314  HGB  11.5*  HCT 37.3  WBC 19.6*  PLT 236   Dg Chest 2 View  Result Date: 10/18/2016 CLINICAL DATA:  Acute onset of shortness of breath, dizziness and nausea. Initial encounter. EXAM: CHEST  2 VIEW COMPARISON:  Chest radiograph performed 09/30/2016, and CTA of the chest performed 10/02/2016 FINDINGS: The lungs are well-aerated and clear. There is no evidence of focal opacification, pleural effusion or pneumothorax. The heart is borderline normal in size. No acute osseous  abnormalities are seen. IMPRESSION: No acute cardiopulmonary process seen. Electronically Signed   By: Garald Balding M.D.   On: 10/18/2016 20:35    ASSESSMENT / PLAN:  Moderate persistent asthma, with an apparent acute exacerbation - Agree with scheduled Brovana, Pulmicort - Would discontinue scheduled DuoNeb, change to albuterol nebulizers as needed for dyspnea, wheezing - Agree with her to get nasal spray, need to make this mandatory. Continue loratadine - steroid taper, quickly convert to by mouth - Agree with GERD control, she needs to be on PPI on a schedule outpatient. May need to change from omeprazole to an alternative to get better control. May need GI evaluation - Need to get her back on Xolair per Dr. Janee Morn plans  Obstructive sleep apnea, currently untreated - Currently untreated. She would benefit from being on CPAP but she's had difficulty tolerating due to claustrophobia. Possibly alternative mask would be beneficial.  Morbid obesity - Weight loss, she has been referred to bariatric program at [facility] in the past. Unclear this is a viable option for her   We will follow with you.   Baltazar Apo, MD, PhD 10/19/2016, 5:16 AM  Pulmonary and Critical Care [phone] or if no answer [phone]

## 2016-10-20 ENCOUNTER — Inpatient Hospital Stay (HOSPITAL_COMMUNITY): Payer: Medicaid Other

## 2016-10-20 DIAGNOSIS — M542 Cervicalgia: Secondary | ICD-10-CM

## 2016-10-20 LAB — RESPIRATORY PANEL BY PCR
Adenovirus: NOT DETECTED
Bordetella pertussis: NOT DETECTED
CORONAVIRUS NL63-RVPPCR: NOT DETECTED
CORONAVIRUS OC43-RVPPCR: NOT DETECTED
Chlamydophila pneumoniae: NOT DETECTED
Coronavirus 229E: NOT DETECTED
Coronavirus HKU1: NOT DETECTED
INFLUENZA A-RVPPCR: NOT DETECTED
INFLUENZA B-RVPPCR: NOT DETECTED
METAPNEUMOVIRUS-RVPPCR: NOT DETECTED
MYCOPLASMA PNEUMONIAE-RVPPCR: NOT DETECTED
PARAINFLUENZA VIRUS 1-RVPPCR: NOT DETECTED
PARAINFLUENZA VIRUS 2-RVPPCR: NOT DETECTED
PARAINFLUENZA VIRUS 3-RVPPCR: NOT DETECTED
PARAINFLUENZA VIRUS 4-RVPPCR: NOT DETECTED
RESPIRATORY SYNCYTIAL VIRUS-RVPPCR: NOT DETECTED
RHINOVIRUS / ENTEROVIRUS - RVPPCR: NOT DETECTED

## 2016-10-20 LAB — GLUCOSE, CAPILLARY
GLUCOSE-CAPILLARY: 208 mg/dL — AB (ref 65–99)
Glucose-Capillary: 244 mg/dL — ABNORMAL HIGH (ref 65–99)

## 2016-10-20 MED ORDER — TRAMADOL HCL 50 MG PO TABS: 50.0000 mg | ORAL_TABLET | Freq: Four times a day (QID) | ORAL | 0 refills | Status: DC | PRN

## 2016-10-20 MED ORDER — VANCOMYCIN 50 MG/ML ORAL SOLUTION: 125.0000 mg | Freq: Four times a day (QID) | ORAL | 0 refills | Status: DC

## 2016-10-20 MED ORDER — BUDESONIDE 0.5 MG/2ML IN SUSP: 0.5000 mg | Freq: Two times a day (BID) | RESPIRATORY_TRACT | 12 refills | Status: DC

## 2016-10-20 MED ORDER — FLUTICASONE PROPIONATE 50 MCG/ACT NA SUSP: 2.0000 | Freq: Every day | NASAL | 6 refills | Status: DC

## 2016-10-20 MED ORDER — VANCOMYCIN 50 MG/ML ORAL SOLUTION
125.0000 mg | Freq: Four times a day (QID) | ORAL | Status: DC
  Administered 2016-10-20 (×2): 125 mg via ORAL
  Filled 2016-10-20 (×3): qty 2.5

## 2016-10-20 MED ORDER — LOSARTAN POTASSIUM 100 MG PO TABS: 100.0000 mg | ORAL_TABLET | Freq: Every day | ORAL | 3 refills | Status: DC

## 2016-10-20 MED ORDER — GUAIFENESIN ER 600 MG PO TB12: 600.0000 mg | ORAL_TABLET | Freq: Two times a day (BID) | ORAL | 0 refills | Status: DC

## 2016-10-20 MED ORDER — PREDNISONE 10 MG PO TABS: ORAL_TABLET | ORAL | 0 refills | Status: DC

## 2016-10-20 MED ORDER — ARFORMOTEROL TARTRATE 15 MCG/2ML IN NEBU: 15.0000 ug | INHALATION_SOLUTION | Freq: Two times a day (BID) | RESPIRATORY_TRACT | 3 refills | Status: DC

## 2016-10-20 MED ORDER — METHOCARBAMOL 500 MG PO TABS: 500.0000 mg | ORAL_TABLET | Freq: Two times a day (BID) | ORAL | 1 refills | Status: DC

## 2016-10-20 NOTE — Discharge Summary (Signed)
Physician Discharge Summary   Patient ID: April [name] MRN: [id number] DOB/AGE: 01/24/1971 46 y.o.  Admit date: 10/18/2016 Discharge date: 10/20/2016  Primary Care Physician:  Boykin Nearing, MD  Discharge Diagnoses:    [name] Moderate persistent asthma with exacerbation . Tobacco abuse . Leukocytosis . Diarrhea- C. difficile positive . Normocytic anemia . accelerated hypertension   Diabetes mellitus   Consults: Pulmonology  Recommendations for Outpatient Follow-up:  1. Please repeat CBC/BMET at next visit 2. Patient had diarrhea for 1 week prior to admission (she was on antibiotics). C. difficile antigen positive however toxin was negative given patient's diarrhea was improving but still persistent, decision was made to treat it as C. difficile.   DIET: Heart healthy diet    Allergies:  No Known Allergies   DISCHARGE MEDICATIONS: Current Discharge Medication List    START taking these medications   Details  guaiFENesin (MUCINEX) 600 MG 12 hr tablet Take 1 tablet (600 mg total) by mouth 2 (two) times daily. Qty: 60 tablet, Refills: 0    losartan (COZAAR) 100 MG tablet Take 1 tablet (100 mg total) by mouth daily. Qty: 30 tablet, Refills: 3    predniSONE (DELTASONE) 10 MG tablet Prednisone dosing: Take  Prednisone 40mg  (4 tabs) x 3 days, then taper to 30mg  (3 tabs) x 3 days, then 20mg  (2 tabs) x 3days, then 10mg  (1 tab) x 3days, then OFF. Qty: 30 tablet, Refills: 0    vancomycin (VANCOCIN) 50 mg/mL oral solution Take 2.5 mLs (125 mg total) by mouth 4 (four) times daily. X 14 days Qty: 60 mL, Refills: 0      CONTINUE these medications which have CHANGED   Details  fluticasone (FLONASE) 50 MCG/ACT nasal spray Place 2 sprays into both nostrils daily. Qty: 16 g, Refills: 6    methocarbamol (ROBAXIN) 500 MG tablet Take 1 tablet (500 mg total) by mouth 2 (two) times daily. Qty: 60 tablet, Refills: 1   Associated Diagnoses: Neck pain    traMADol (ULTRAM) 50 MG tablet  Take 1 tablet (50 mg total) by mouth every 6 (six) hours as needed. Qty: 15 tablet, Refills: 0      CONTINUE these medications which have NOT CHANGED   Details  albuterol (PROAIR HFA) 108 (90 Base) MCG/ACT inhaler Inhale 2 puffs into the lungs every 6 (six) hours as needed for wheezing or shortness of breath. Qty: 1 Inhaler, Refills: 12    albuterol (PROVENTIL) (2.5 MG/3ML) 0.083% nebulizer solution Take 3 mLs (2.5 mg total) by nebulization every 4 (four) hours as needed for wheezing or shortness of breath. Qty: 150 mL, Refills: 2   Associated Diagnoses: Asthma, chronic obstructive, without status asthmaticus (HCC)    amLODipine (NORVASC) 10 MG tablet Take 1 tablet (10 mg total) by mouth daily. Qty: 30 tablet, Refills: 2   Associated Diagnoses: Essential hypertension    furosemide (LASIX) 20 MG tablet Take 1 tablet (20 mg total) by mouth daily. Qty: 30 tablet, Refills: 1    ipratropium (ATROVENT) 0.02 % nebulizer solution Take 2.5 mLs (0.5 mg total) by nebulization 4 (four) times daily as needed for wheezing or shortness of breath. Qty: 150 mL, Refills: 2   Associated Diagnoses: Asthma, chronic obstructive, without status asthmaticus (HCC)    loratadine (CLARITIN) 10 MG tablet Take 1 tablet (10 mg total) by mouth daily. Qty: 90 tablet, Refills: 3   Associated Diagnoses: Asthma, chronic obstructive, without status asthmaticus (HCC)    metFORMIN (GLUCOPHAGE) 500 MG tablet Take 1 tablet (500 mg  total) by mouth 2 (two) times daily with a meal. Qty: 60 tablet, Refills: 0    mometasone-formoterol (DULERA) 200-5 MCG/ACT AERO Inhale 2 puffs into the lungs 2 (two) times daily. Qty: 1 Inhaler, Refills: 2    nicotine (NICODERM CQ - DOSED IN MG/24 HOURS) 14 mg/24hr patch Place 1 patch (14 mg total) onto the skin daily. Qty: 28 patch, Refills: 0    omeprazole (PRILOSEC) 20 MG capsule Take 1 capsule (20 mg total) by mouth daily. Qty: 30 capsule, Refills: 11    sertraline (ZOLOFT) 50 MG  tablet Take 1 tablet (50 mg total) by mouth daily. Qty: 90 tablet, Refills: 3   Associated Diagnoses: Generalized anxiety disorder    spironolactone (ALDACTONE) 50 MG tablet Take 1 tablet (50 mg total) by mouth daily. Qty: 60 tablet, Refills: 2   Associated Diagnoses: Essential hypertension    traZODone (DESYREL) 50 MG tablet Take 0.5-1 tablets (25-50 mg total) by mouth at bedtime as needed for sleep. Qty: 30 tablet, Refills: 3   Associated Diagnoses: Primary insomnia    zolpidem (AMBIEN) 10 MG tablet Take 1 tablet (10 mg total) by mouth at bedtime as needed for sleep. Qty: 30 tablet, Refills: 5      STOP taking these medications     levofloxacin (LEVAQUIN) 750 MG tablet      esomeprazole (NEXIUM) 40 MG capsule          Brief H and P: For complete details please refer to admission H and P, but in brief The patient is a 46 year old female with asthma, morbid obesity, NIDDM, OSA on CPAP who presented with dyspnea, chest tightness for 4 days. Patient noted chest tightness, wheezing, shortness of breath. Patient reported diarrhea for 1 week.   Hospital Course:   Moderate persistent asthma with exacerbation - Wheezing has improved. Pulmonology was consulted. Recommended IV Solu-Medrol, nebulizers as needed, patient was placed on scheduled brovana, Pulmicort - She will need to be on xolair as recommended by pulmonology outpatient. - Outpatient follow-up with pulmonology.  - Patient was transitioned to oral prednisone with taper, continue dulera, albuterol nebs as needed     Morbid obesity with body mass index of 50.0-59.9 in adult [facility]) - Patient counseled on diet and weight control  Accelerated hypertension - BP was uncontrolled, 206/132 at the time of admission - Continue Norvasc, Lasix, Aldactone, added losartan 100 mg daily  Diarrhea - Patient reported diarrhea in the last 1 week and has been on multiple antibiotics in the last few weeks - Diarrhea is improving. C.  difficile antigen was positive however toxin was negative. Since diarrhea is still persisting although improving, decision was made to treat C. difficile with oral vancomycin for 14 days.     Type 2 diabetes mellitus without complication, without long-term current use of insulin (HCC) -Continue metformin    Normocytic anemia - Continue to monitor H&H, currently at baseline  GERD - Continue PPI  OSA - Noncompliant with CPAP  Tobacco abuse - Counseled strongly on tobacco cessation, continue nicotine patch   Day of Discharge No wheezing, diarrhea improving. No fevers or chills, shortness of breath is improving.  BP (!) 164/93 (BP Location: Right Arm)   Pulse 89   Temp 98.5 F (36.9 C)   Resp 20   Ht 5\' 6"  (1.676 m)   Wt (!) 149.7 kg (330 lb)   LMP 09/28/2016 (Exact Date)   SpO2 97%   BMI 53.26 kg/m   Physical Exam: General: Alert and awake oriented  x3 not in any acute distress. HEENT: anicteric sclera, pupils reactive to light and accommodation CVS: S1-S2 clear no murmur rubs or gallops Chest: clear to auscultation bilaterally, no wheezing rales or rhonchi Abdomen: soft nontender, nondistended, normal bowel sounds Extremities: no cyanosis, clubbing or edema noted bilaterally Neuro: Cranial nerves II-XII intact, no focal neurological deficits   The results of significant diagnostics from this hospitalization (including imaging, microbiology, ancillary and laboratory) are listed below for reference.    LAB RESULTS: Basic Metabolic Panel:  Recent Labs Lab 10/18/16 2314  NA 137  K 3.5  CL 104  CO2 26  GLUCOSE 109*  BUN 5*  CREATININE 0.81  CALCIUM 8.5*   Liver Function Tests: No results for input(s): AST, ALT, ALKPHOS, BILITOT, PROT, ALBUMIN in the last 168 hours. No results for input(s): LIPASE, AMYLASE in the last 168 hours. No results for input(s): AMMONIA in the last 168 hours. CBC:  Recent Labs Lab 10/18/16 2314  WBC 19.6*  NEUTROABS 14.3*   HGB 11.5*  HCT 37.3  MCV 85.9  PLT 236   Cardiac Enzymes: No results for input(s): CKTOTAL, CKMB, CKMBINDEX, TROPONINI in the last 168 hours. BNP: Invalid input(s): POCBNP CBG:  Recent Labs Lab 10/19/16 2152 10/20/16 0755  GLUCAP 219* 208*    Significant Diagnostic Studies:  Dg Chest 2 View  Result Date: 10/18/2016 CLINICAL DATA:  Acute onset of shortness of breath, dizziness and nausea. Initial encounter. EXAM: CHEST  2 VIEW COMPARISON:  Chest radiograph performed 09/30/2016, and CTA of the chest performed 10/02/2016 FINDINGS: The lungs are well-aerated and clear. There is no evidence of focal opacification, pleural effusion or pneumothorax. The heart is borderline normal in size. No acute osseous abnormalities are seen. IMPRESSION: No acute cardiopulmonary process seen. Electronically Signed   By: Garald Balding M.D.   On: 10/18/2016 20:35    2D ECHO:   Disposition and Follow-up:    DISPOSITION:Home   DISCHARGE FOLLOW-UP Follow-up Information    Boykin Nearing, MD. Schedule an appointment as soon as possible for a visit in 2 week(s).   Specialty:  Family Medicine Contact information: [place] [address] [phone]        Deneise Lever, MD. Schedule an appointment as soon as possible for a visit in 2 week(s).   Specialty:  Pulmonary Disease Why:  hospital follow-up Contact information: [place] [address] [phone]            Time spent on Discharge: 39 minutes  Signed:   Estill Cotta M.D. [facility] 10/20/2016, 11:27 AM Pager: [phone]

## 2016-10-20 NOTE — Progress Notes (Signed)
Pt given discharge instructions, prescriptions, and care notes. Pt verbalized understanding AEB no further questions or concerns at this time. IV was discontinued, no redness, pain, or swelling noted at this time. Telemetry discontinued and Centralized Telemetry was notified. Pt left the floor via wheelchair with staff in stable condition. 

## 2016-10-20 NOTE — Progress Notes (Signed)
Name: April [name] MRN: [id number] DOB: 03-09-1971    ADMISSION DATE:  10/18/2016 CONSULTATION DATE:  10/19/2016  REFERRING MD :  [facility]. Tamala [facility], [facility]  CHIEF COMPLAINT:  Dyspnea, wheezing  SIGNIFICANT EVENTS   STUDIES:  Spirometry 07/29/16  >> FEV1 1.53 L (57% predicted) with curvature on f/v loop suggesting obst but most of the changes were restrictive  Echocardiogram 10/03/16 >> mild LVH, LVEF 55-60%, no wall motion abnormalities, grade 1 diastolic dysfunction, normal right ventricular size and function, estimated PASP 35mmHg   HISTORY OF PRESENT ILLNESS:  46 year old morbidly obese former smoker followed by Dr. Annamaria Boots in our office for moderate persistent asthma for which she has been treated with Aggie Cosier, albuterol. Contributing factors have been felt to be allergic rhinitis, currently on loratadine daily and fluticasone nasal spray when necessary; GERD with breakthrough symptoms on PPI; obesity. Also with a history of untreated obstructive sleep apnea. She has persistent symptoms, frequent emergency room visits. She has been on and off prednisone for the last month, both prednisone tapers and also symptom driven prednisone from our office. She has not had a Xolair treatment for over 2 months due to transportation difficulties. She can't identify any clear precipitating symptoms other than some increased nasal congestion. She denies any other overt URI or viral symptoms.     SUBJECTIVE:  Feels fine and wants to go home -  RA sats 97%   VITAL SIGNS: Temp:  [98.4 F (36.9 C)-98.6 F (37 C)] 98.5 F (36.9 C) (06/24 0554) Pulse Rate:  [76-97] 89 (06/24 0554) Resp:  [18-20] 20 (06/24 0554) BP: (158-198)/(87-130) 164/93 (06/24 0554) SpO2:  [97 %-100 %] 97 % (06/24 0723)  PHYSICAL EXAMINATION: General:  Morbidly obese woman, no distress Neuro:  Awake, alert, interacting, nonfocal exam HEENT:  Large neck, stridor present, oropharynx clear, pupils equal reactive Cardiovascular:  Distant  heart sounds, regular, no murmur, trace bilateral pretibial edema Lungs:  Minimal pseudowheeze / good breath sounds s wheeze Abdomen:  Obese, soft, nontender, positive bowel sounds Musculoskeletal:  No deformities Skin:  No rash   Recent Labs Lab 10/18/16 2314  NA 137  K 3.5  CL 104  CO2 26  BUN 5*  CREATININE 0.81  GLUCOSE 109*    Recent Labs Lab 10/18/16 2314  HGB 11.5*  HCT 37.3  WBC 19.6*  PLT 236   Dg Chest 2 View  Result Date: 10/18/2016 CLINICAL DATA:  Acute onset of shortness of breath, dizziness and nausea. Initial encounter. EXAM: CHEST  2 VIEW COMPARISON:  Chest radiograph performed 09/30/2016, and CTA of the chest performed 10/02/2016 FINDINGS: The lungs are well-aerated and clear. There is no evidence of focal opacification, pleural effusion or pneumothorax. The heart is borderline normal in size. No acute osseous abnormalities are seen. IMPRESSION: No acute cardiopulmonary process seen. Electronically Signed   By: Garald Balding M.D.   On: 10/18/2016 20:35   Ct Head Wo Contrast  Result Date: 10/20/2016 CLINICAL DATA:  Fall last week EXAM: CT HEAD WITHOUT CONTRAST TECHNIQUE: Contiguous axial images were obtained from the base of the skull through the vertex without intravenous contrast. COMPARISON:  None. FINDINGS: Brain: Ventricles are normal in size and configuration. There is no mass, hemorrhage, edema or other evidence of acute parenchymal abnormality. No extra-axial hemorrhage. Vascular: No hyperdense vessel or unexpected calcification. Skull: Normal. Negative for fracture or focal lesion. Sinuses/Orbits: No acute finding. Other: None. IMPRESSION: Negative head CT.  No intracranial mass, hemorrhage or edema. Electronically Signed   By:  Franki Cabot M.D.   On: 10/20/2016 10:11    ASSESSMENT / PLAN:  Moderate persistent asthma, with an apparent acute exacerbation - DDX of  difficult airways management almost all start with A and  include Adherence, Ace  Inhibitors, Acid Reflux, Active Sinus Disease, Alpha 1 Antitripsin deficiency, Anxiety masquerading as Airways dz,  ABPA,  Allergy(esp in young), Aspiration (esp in elderly), Adverse effects of meds,  Active smokers, A bunch of PE's (a small clot burden can't cause this syndrome unless there is already severe underlying pulm or vascular dz with poor reserve) plus two Bs  = Bronchiectasis and Beta blocker use..and one C= CHF  Adherence is always the initial "prime suspect" and is a multilayered concern that requires a "trust but verify" approach in every patient - starting with knowing how to use medications, especially inhalers, correctly, keeping up with refills and understanding the fundamental difference between maintenance and prns vs those medications only taken for a very short course and then stopped and not refilled.  - needs to return with all meds in hand using a trust but verify approach to confirm accurate Medication  Reconciliation The principal here is that until we are certain that the  patients are doing what we've asked, it makes no sense to ask them to do more.    ? Allergy > taper prednisone/ consider xopenx  ? Acid (or non-acid) GERD > always difficult to exclude as up to 75% of pts in some series report no assoc GI/ Heartburn symptoms> rec max (24h)  acid suppression and diet restrictions/ reviewed and instructions given in writing.   ? Active smoking > denies but still on nicotine patches > encouraged   Ok for discharge on present rx > needs f/u in our office w/in 2 weeks with all meds in hand to regroup re longterm rx   Christinia Gully, MD Pulmonary and [place] [phone] After 5:30 PM or weekends, use [facility] [phone]

## 2016-10-21 ENCOUNTER — Telehealth: Payer: Self-pay

## 2016-10-21 NOTE — Telephone Encounter (Signed)
Attempted to contact Ms Bonds with [facility] # [phone] twice and both times the call was dropped after 7 rings and there was no option to leave a voicemail message.

## 2016-10-21 NOTE — Telephone Encounter (Signed)
[facility] Post-discharge Follow-Up Phone Call:  Date of Discharge: 10/20/16 Principal Discharge Diagnosis(es):  Asthma exacerbation Post-discharge Communication: (Clearly document all attempts clearly and date contact made) call placed to # [phone] and a HIPAA compliant voicemail message was left requesting a call back to # [phone] Call Completed: No

## 2016-10-24 ENCOUNTER — Ambulatory Visit (HOSPITAL_COMMUNITY)
Admission: EM | Admit: 2016-10-24 | Discharge: 2016-10-24 | Disposition: A | Discharge status: Home / Self Care | Payer: Medicaid Other | Attending: Internal Medicine | Admitting: Internal Medicine

## 2016-10-24 ENCOUNTER — Encounter (HOSPITAL_COMMUNITY): Payer: Self-pay | Admitting: Emergency Medicine

## 2016-10-24 DIAGNOSIS — M25562 Pain in left knee: Secondary | ICD-10-CM

## 2016-10-24 MED ORDER — TRIAMCINOLONE ACETONIDE 40 MG/ML IJ SUSP
INTRAMUSCULAR | Status: AC
  Filled 2016-10-24: qty 1

## 2016-10-24 MED ORDER — LIDOCAINE HCL (PF) 2 % IJ SOLN
INTRAMUSCULAR | Status: AC
  Filled 2016-10-24: qty 2

## 2016-10-24 NOTE — ED Provider Notes (Signed)
CSN: [id number]     Arrival date & time 10/24/16  1401 History   None    Chief Complaint  Patient presents with  . Knee Pain   (Consider location/radiation/quality/duration/timing/severity/associated sxs/prior Treatment) Patient  C/o bilateral knee pain her left is hurting more than her right.  She takes tramadol and robaxin and it is not helping today.   The history is provided by the patient.  Knee Pain  Location:  Knee Time since incident:  2 days Injury: no   Knee location:  L knee Pain details:    Quality:  Aching   Radiates to:  Does not radiate   Severity:  Moderate   Onset quality:  Sudden   Duration:  2 days   Timing:  Constant Chronicity:  New Dislocation: no   Foreign body present:  No foreign bodies   Past Medical History:  Diagnosis Date  . Acanthosis nigricans   . Arthritis   . Asthma    Followed by Dr. Annamaria Boots (pulmonology); receives every other week omalizumab injections; has frequent exacerbations  . COPD (chronic obstructive pulmonary disease) ([place])    PFTs in 2002, FEV1/FVC 65, no post bronchodilater test done  . Depression   . GERD (gastroesophageal reflux disease)   . Headache(784.0)   . Helicobacter pylori (H. pylori) infection   . Hypertension, essential   . Insomnia   . Menorrhagia   . Morbid obesity ([place])   . Obesity   . Seasonal allergies   . Shortness of breath   . Sleep apnea    Sleep study 2008 - mild OSA, not enough events to titrate CPAP  . Tobacco user    Past Surgical History:  Procedure Laterality Date  . BREAST REDUCTION SURGERY  09/2011  . TUBAL LIGATION  1996   bilateral   Family History  Problem Relation Age of Onset  . Hypertension Mother   . Asthma Daughter   . Cancer Paternal Aunt   . Asthma Maternal Grandmother    Social History  Substance Use Topics  . Smoking status: Former Smoker    Packs/day: 0.50    Years: 18.00    Types: Cigarettes    Quit date: 04/29/2014  . Smokeless tobacco: Never Used  . Alcohol  use No   OB History    No data available     Review of Systems  Constitutional: Negative.   HENT: Negative.   Eyes: Negative.   Respiratory: Negative.   Cardiovascular: Negative.   Gastrointestinal: Negative.   Endocrine: Negative.   Genitourinary: Negative.   Musculoskeletal: Positive for arthralgias.  Allergic/Immunologic: Negative.   Neurological: Negative.   Hematological: Negative.     Allergies  Patient has no known allergies.  Home Medications   Prior to Admission medications   Medication Sig Start Date End Date Taking? Authorizing Provider  albuterol (PROAIR HFA) 108 (90 Base) MCG/ACT inhaler Inhale 2 puffs into the lungs every 6 (six) hours as needed for wheezing or shortness of breath. 10/04/16  Yes Reyne Dumas, MD  albuterol (PROVENTIL) (2.5 MG/3ML) 0.083% nebulizer solution Take 3 mLs (2.5 mg total) by nebulization every 4 (four) hours as needed for wheezing or shortness of breath. 10/04/16  Yes Reyne Dumas, MD  amLODipine (NORVASC) 10 MG tablet Take 1 tablet (10 mg total) by mouth daily. 05/03/16  Yes Funches, Josalyn, MD  furosemide (LASIX) 20 MG tablet Take 1 tablet (20 mg total) by mouth daily. 10/05/16  Yes Reyne Dumas, MD  loratadine (CLARITIN) 10 MG tablet Take  1 tablet (10 mg total) by mouth daily. 08/30/15  Yes [name], MD  losartan (COZAAR) 100 MG tablet Take 1 tablet (100 mg total) by mouth daily. 10/20/16  Yes Rai, Ripudeep K, MD  metFORMIN (GLUCOPHAGE) 500 MG tablet Take 1 tablet (500 mg total) by mouth 2 (two) times daily with a meal. 05/07/16  Yes Vann, Jessica U, DO  methocarbamol (ROBAXIN) 500 MG tablet Take 1 tablet (500 mg total) by mouth 2 (two) times daily. 10/20/16  Yes Rai, Ripudeep K, MD  nicotine (NICODERM CQ - DOSED IN MG/24 HOURS) 14 mg/24hr patch Place 1 patch (14 mg total) onto the skin daily. 10/04/16  Yes Reyne Dumas, MD  omeprazole (PRILOSEC) 20 MG capsule Take 1 capsule (20 mg total) by mouth daily. 10/09/16  Yes Young, Tarri Fuller D, MD   predniSONE (DELTASONE) 10 MG tablet Prednisone dosing: Take  Prednisone 40mg  (4 tabs) x 3 days, then taper to 30mg  (3 tabs) x 3 days, then 20mg  (2 tabs) x 3days, then 10mg  (1 tab) x 3days, then OFF. 10/20/16  Yes Rai, Ripudeep K, MD  sertraline (ZOLOFT) 50 MG tablet Take 1 tablet (50 mg total) by mouth daily. 08/30/15  Yes [name], MD  spironolactone (ALDACTONE) 50 MG tablet Take 1 tablet (50 mg total) by mouth daily. 10/04/16  Yes Reyne Dumas, MD  traMADol (ULTRAM) 50 MG tablet Take 1 tablet (50 mg total) by mouth every 6 (six) hours as needed. 10/20/16  Yes Rai, Ripudeep K, MD  traZODone (DESYREL) 50 MG tablet Take 0.5-1 tablets (25-50 mg total) by mouth at bedtime as needed for sleep. 10/04/16  Yes Reyne Dumas, MD  vancomycin (VANCOCIN) 50 mg/mL oral solution Take 2.5 mLs (125 mg total) by mouth 4 (four) times daily. X 14 days 10/20/16  Yes Rai, Ripudeep K, MD  zolpidem (AMBIEN) 10 MG tablet Take 1 tablet (10 mg total) by mouth at bedtime as needed for sleep. 10/10/16 11/09/16 Yes Young, Clinton D, MD  fluticasone (FLONASE) 50 MCG/ACT nasal spray Place 2 sprays into both nostrils daily. 10/20/16   Rai, Vernelle Emerald, MD  guaiFENesin (MUCINEX) 600 MG 12 hr tablet Take 1 tablet (600 mg total) by mouth 2 (two) times daily. 10/20/16   Rai, Ripudeep K, MD  ipratropium (ATROVENT) 0.02 % nebulizer solution Take 2.5 mLs (0.5 mg total) by nebulization 4 (four) times daily as needed for wheezing or shortness of breath. 06/06/16   Deneise Lever, MD  mometasone-formoterol (DULERA) 200-5 MCG/ACT AERO Inhale 2 puffs into the lungs 2 (two) times daily. Patient taking differently: Inhale 1 puff into the lungs 2 (two) times daily.  06/06/16   Deneise Lever, MD   Meds Ordered and Administered this Visit  Medications - No data to display  BP (!) 144/93 (BP Location: Left Wrist) Comment: notified rn  Pulse 96   Temp 98.3 F (36.8 C) (Oral)   Resp 16   LMP 10/20/2016 (Exact Date)   SpO2 97%  No data  found.   Physical Exam  Constitutional: She appears well-developed and well-nourished.  HENT:  Head: Normocephalic and atraumatic.  Eyes: Conjunctivae and EOM are normal. Pupils are equal, round, and reactive to light.  Neck: Normal range of motion. Neck supple.  Cardiovascular: Normal rate, regular rhythm and normal heart sounds.   Pulmonary/Chest: Effort normal and breath sounds normal.  Musculoskeletal: She exhibits tenderness.  TTP left knee  Nursing note and vitals reviewed.   Urgent Care Course     .Joint Aspiration/Arthrocentesis Date/Time:  10/24/2016 3:22 PM Performed by: Lysbeth Penner Authorized by: Sherlene Shams   Consent:    Consent obtained:  Verbal   Consent given by:  Patient   Risks discussed:  Bleeding   Alternatives discussed:  No treatment Location:    Location:  Knee   Knee:  L knee Anesthesia (see MAR for exact dosages):    Anesthesia method:  None Procedure details:    Needle gauge:  22 G   Ultrasound guidance: no     Approach:  Anterior   Aspirate amount:  0   Steroid injected: yes     Specimen collected: no   Post-procedure details:    Patient tolerance of procedure:  Tolerated well, no immediate complications Comments:     Injected 4 ml of lidocaine 1 % w/o lidocaine and 1 ml Kenalog 40mg  injected.   (including critical care time)  Labs Review Labs Reviewed - No data to display  Imaging Review No results found.   Visual Acuity Review  Right Eye Distance:   Left Eye Distance:   Bilateral Distance:    Right Eye Near:   Left Eye Near:    Bilateral Near:         MDM   1. Acute pain of left knee    Left knee injection    Lysbeth Penner, FNP 10/24/16 1522    Lysbeth Penner, FNP 10/24/16 1524

## 2016-10-24 NOTE — ED Triage Notes (Signed)
Pt here for chronic bilateral knee pain.  Pt was referred to orthopedist, but she wants a second opinion and wants relief from the pain today.

## 2016-10-27 ENCOUNTER — Encounter (HOSPITAL_COMMUNITY): Payer: Self-pay | Admitting: Emergency Medicine

## 2016-10-27 ENCOUNTER — Emergency Department (HOSPITAL_COMMUNITY)
Admission: EM | Admit: 2016-10-27 | Discharge: 2016-10-27 | Disposition: A | Discharge status: Home / Self Care | Payer: Medicaid Other | Attending: Emergency Medicine | Admitting: Emergency Medicine

## 2016-10-27 ENCOUNTER — Emergency Department (HOSPITAL_COMMUNITY): Payer: Medicaid Other

## 2016-10-27 DIAGNOSIS — K649 Unspecified hemorrhoids: Secondary | ICD-10-CM | POA: Diagnosis present

## 2016-10-27 DIAGNOSIS — E119 Type 2 diabetes mellitus without complications: Secondary | ICD-10-CM | POA: Diagnosis not present

## 2016-10-27 DIAGNOSIS — J45909 Unspecified asthma, uncomplicated: Secondary | ICD-10-CM | POA: Insufficient documentation

## 2016-10-27 DIAGNOSIS — I10 Essential (primary) hypertension: Secondary | ICD-10-CM | POA: Diagnosis not present

## 2016-10-27 DIAGNOSIS — Z79899 Other long term (current) drug therapy: Secondary | ICD-10-CM | POA: Diagnosis not present

## 2016-10-27 DIAGNOSIS — Z87891 Personal history of nicotine dependence: Secondary | ICD-10-CM | POA: Insufficient documentation

## 2016-10-27 DIAGNOSIS — Z7984 Long term (current) use of oral hypoglycemic drugs: Secondary | ICD-10-CM | POA: Diagnosis not present

## 2016-10-27 DIAGNOSIS — K6289 Other specified diseases of anus and rectum: Secondary | ICD-10-CM | POA: Diagnosis not present

## 2016-10-27 DIAGNOSIS — K645 Perianal venous thrombosis: Secondary | ICD-10-CM | POA: Insufficient documentation

## 2016-10-27 DIAGNOSIS — J449 Chronic obstructive pulmonary disease, unspecified: Secondary | ICD-10-CM | POA: Insufficient documentation

## 2016-10-27 LAB — CBC
HEMATOCRIT: 38.9 % (ref 36.0–46.0)
HEMOGLOBIN: 12.3 g/dL (ref 12.0–15.0)
MCH: 26.7 pg (ref 26.0–34.0)
MCHC: 31.6 g/dL (ref 30.0–36.0)
MCV: 84.6 fL (ref 78.0–100.0)
PLATELETS: 377 10*3/uL (ref 150–400)
RBC: 4.6 MIL/uL (ref 3.87–5.11)
RDW: 18.8 % — ABNORMAL HIGH (ref 11.5–15.5)
WBC: 18.8 10*3/uL — AB (ref 4.0–10.5)

## 2016-10-27 LAB — COMPREHENSIVE METABOLIC PANEL
ALT: 26 U/L (ref 14–54)
ANION GAP: 8 (ref 5–15)
AST: 23 U/L (ref 15–41)
Albumin: 3.2 g/dL — ABNORMAL LOW (ref 3.5–5.0)
Alkaline Phosphatase: 60 U/L (ref 38–126)
BUN: 5 mg/dL — ABNORMAL LOW (ref 6–20)
CHLORIDE: 107 mmol/L (ref 101–111)
CO2: 22 mmol/L (ref 22–32)
Calcium: 8.2 mg/dL — ABNORMAL LOW (ref 8.9–10.3)
Creatinine, Ser: 0.8 mg/dL (ref 0.44–1.00)
Glucose, Bld: 101 mg/dL — ABNORMAL HIGH (ref 65–99)
Potassium: 3.5 mmol/L (ref 3.5–5.1)
SODIUM: 137 mmol/L (ref 135–145)
Total Bilirubin: 0.5 mg/dL (ref 0.3–1.2)
Total Protein: 6 g/dL — ABNORMAL LOW (ref 6.5–8.1)

## 2016-10-27 LAB — I-STAT BETA HCG BLOOD, ED (MC, WL, AP ONLY): I-stat hCG, quantitative: <5 m[IU]/mL (ref <5)

## 2016-10-27 MED ORDER — LIDOCAINE HCL 2 % EX GEL
1.0000 "application " | Freq: Once | CUTANEOUS | Status: AC
  Administered 2016-10-27: 1 via TOPICAL
  Filled 2016-10-27: qty 20

## 2016-10-27 MED ORDER — MORPHINE SULFATE (PF) 4 MG/ML IV SOLN
6.0000 mg | Freq: Once | INTRAVENOUS | Status: AC
  Administered 2016-10-27: 6 mg via INTRAMUSCULAR
  Filled 2016-10-27: qty 2

## 2016-10-27 MED ORDER — LIDOCAINE HCL 2 % IJ SOLN
20.0000 mL | Freq: Once | INTRAMUSCULAR | Status: AC
  Administered 2016-10-27: 400 mg
  Filled 2016-10-27: qty 20

## 2016-10-27 MED ORDER — SENNOSIDES-DOCUSATE SODIUM 8.6-50 MG PO TABS: ORAL_TABLET | ORAL | 0 refills | Status: DC

## 2016-10-27 MED ORDER — IOPAMIDOL (ISOVUE-300) INJECTION 61%
INTRAVENOUS | Status: AC
  Administered 2016-10-27: 100 mL
  Filled 2016-10-27: qty 100

## 2016-10-27 MED ORDER — OXYCODONE-ACETAMINOPHEN 5-325 MG PO TABS: 1.0000 | ORAL_TABLET | ORAL | 0 refills | Status: DC | PRN

## 2016-10-27 NOTE — ED Notes (Signed)
Patient transported to CT 

## 2016-10-27 NOTE — ED Triage Notes (Signed)
History of hemmoroids and states past 1-2 days pain worsening and bright red blood after wiping rectum.

## 2016-10-27 NOTE — Discharge Instructions (Signed)
Take percocet for breakthrough pain, do not drink alcohol, drive, care for children or do other critical tasks while taking percocet.  Is critically important that you take sitz bath after every bowel movement. Keep the area clean and follow-up with general surgery for definitive management of your hemorrhoids  Please follow with your primary care doctor in the next 2 days for a check-up. They must obtain records for further management.   Do not hesitate to return to the Emergency Department for any new, worsening or concerning symptoms.

## 2016-10-27 NOTE — ED Provider Notes (Signed)
[facility] DEPT Provider Note   CSN: [id number] Arrival date & time: 10/27/16  1402     History   Chief Complaint Chief Complaint  Patient presents with  . Hemorrhoids    HPI  Blood pressure (!) 178/93, pulse 96, temperature 98.3 F (36.8 C), temperature source Oral, resp. rate 16, height 5\' 6"  (1.676 m), weight (!) 149.7 kg (330 lb), last menstrual period 10/20/2016, SpO2 99 %.  April [name] is a 46 y.o. female complaining of Severe pain in the hemorrhoids onset 5 days ago. Patient has not been taking any stool softener however she has been using preparation H, suppositories with little relief. She denies fevers, chills. No pain medication taken prior to arrival. Pain is severe, 10 out of 10 and exacerbated by movement and palpation and position. She denies any fever, chills, anticoagulation.   Past Medical History:  Diagnosis Date  . Acanthosis nigricans   . Arthritis   . Asthma    Followed by Dr. Annamaria Boots (pulmonology); receives every other week omalizumab injections; has frequent exacerbations  . COPD (chronic obstructive pulmonary disease) ([place])    PFTs in 2002, FEV1/FVC 65, no post bronchodilater test done  . Depression   . GERD (gastroesophageal reflux disease)   . Headache(784.0)   . Helicobacter pylori (H. pylori) infection   . Hypertension, essential   . Insomnia   . Menorrhagia   . Morbid obesity ([place])   . Obesity   . Seasonal allergies   . Shortness of breath   . Sleep apnea    Sleep study 2008 - mild OSA, not enough events to titrate CPAP  . Tobacco user     Patient Active Problem List   Diagnosis Date Noted  . Moderate persistent asthma with exacerbation 10/19/2016  . Diarrhea 10/19/2016  . Type 2 diabetes mellitus without complication, without long-term current use of insulin ([place]) 10/02/2016  . Normocytic anemia 10/02/2016  . Respiratory failure ([place]) 10/02/2016  . Hypertensive urgency, malignant 06/22/2016  . Pulmonary edema 06/22/2016  .  Hypertensive urgency 06/22/2016  . Malignant hypertension due to primary aldosteronism ([place]) 05/05/2016  . Lip laceration 05/05/2016  . Elevated hemoglobin A1c 05/05/2016  . Asthma exacerbation 11/10/2015  . GERD (gastroesophageal reflux disease) 08/30/2015  . Generalized anxiety disorder 08/30/2015  . Seasonal allergic rhinitis 08/29/2013  . Leukocytosis 10/21/2012  . Tobacco abuse 10/07/2012  . Asthma, chronic obstructive, without status asthmaticus ([place]) 05/07/2012  . Knee pain, bilateral 04/25/2011  . Primary insomnia 03/14/2011  . Mild obstructive sleep apnea 12/19/2010  . Hypokalemia 08/13/2010  . Cervical back pain with evidence of disc disease 04/08/2008  . Essential hypertension 07/31/2006  . Morbid obesity with body mass index of 50.0-59.9 in adult ([place]) 06/17/2006  . Major depressive disorder, recurrent episode ([place]) 04/10/2006    Past Surgical History:  Procedure Laterality Date  . BREAST REDUCTION SURGERY  09/2011  . TUBAL LIGATION  1996   bilateral    OB History    No data available       Home Medications    Prior to Admission medications   Medication Sig Start Date End Date Taking? Authorizing Provider  albuterol (PROAIR HFA) 108 (90 Base) MCG/ACT inhaler Inhale 2 puffs into the lungs every 6 (six) hours as needed for wheezing or shortness of breath. 10/04/16  Yes Reyne Dumas, MD  albuterol (PROVENTIL) (2.5 MG/3ML) 0.083% nebulizer solution Take 3 mLs (2.5 mg total) by nebulization every 4 (four) hours as needed for wheezing or shortness of breath.  10/04/16  Yes Reyne Dumas, MD  amLODipine (NORVASC) 10 MG tablet Take 1 tablet (10 mg total) by mouth daily. 05/03/16  Yes Funches, Josalyn, MD  fluticasone (FLONASE) 50 MCG/ACT nasal spray Place 2 sprays into both nostrils daily. Patient taking differently: Place 2 sprays into both nostrils daily as needed (congestion).  10/20/16  Yes Rai, Ripudeep K, MD  furosemide (LASIX) 20 MG tablet Take 1 tablet (20 mg total) by  mouth daily. 10/05/16  Yes Reyne Dumas, MD  ipratropium (ATROVENT) 0.02 % nebulizer solution Take 2.5 mLs (0.5 mg total) by nebulization 4 (four) times daily as needed for wheezing or shortness of breath. 06/06/16  Yes Young, Tarri Fuller D, MD  loratadine (CLARITIN) 10 MG tablet Take 1 tablet (10 mg total) by mouth daily. 08/30/15  Yes [name], MD  losartan (COZAAR) 100 MG tablet Take 1 tablet (100 mg total) by mouth daily. 10/20/16  Yes Rai, Ripudeep K, MD  metFORMIN (GLUCOPHAGE) 500 MG tablet Take 1 tablet (500 mg total) by mouth 2 (two) times daily with a meal. 05/07/16  Yes Vann, Jessica U, DO  methocarbamol (ROBAXIN) 500 MG tablet Take 1 tablet (500 mg total) by mouth 2 (two) times daily. Patient taking differently: Take 500 mg by mouth 2 (two) times daily as needed for muscle spasms.  10/20/16  Yes Rai, Ripudeep K, MD  mometasone-formoterol (DULERA) 200-5 MCG/ACT AERO Inhale 2 puffs into the lungs 2 (two) times daily. Patient taking differently: Inhale 1 puff into the lungs 2 (two) times daily.  06/06/16  Yes Young, Tarri Fuller D, MD  Multiple Vitamin (MULTIVITAMIN WITH MINERALS) TABS tablet Take 1 tablet by mouth daily. One-A-Day   Yes [provider]  nicotine (NICODERM CQ - DOSED IN MG/24 HOURS) 14 mg/24hr patch Place 1 patch (14 mg total) onto the skin daily. Patient taking differently: Place 14 mg onto the skin daily as needed (smoking cessation).  10/04/16  Yes Reyne Dumas, MD  omeprazole (PRILOSEC) 20 MG capsule Take 1 capsule (20 mg total) by mouth daily. Patient taking differently: Take 20 mg by mouth at bedtime.  10/09/16  Yes Young, Tarri Fuller D, MD  predniSONE (DELTASONE) 10 MG tablet Prednisone dosing: Take  Prednisone 40mg  (4 tabs) x 3 days, then taper to 30mg  (3 tabs) x 3 days, then 20mg  (2 tabs) x 3days, then 10mg  (1 tab) x 3days, then OFF. Patient taking differently: Take 10-40 mg by mouth See admin instructions. Order date 10/20/16:  Take 4 tablets (40 mg) by mouth daily for 3 days,  then take 3 tablets (30 mg) daily for 3 days, then take 2 tablets (20 mg) daily for 3 days, then take 1 tablet (10 mg) daily for 3 days, then STOP. 10/20/16  Yes Rai, Ripudeep K, MD  sertraline (ZOLOFT) 50 MG tablet Take 1 tablet (50 mg total) by mouth daily. 08/30/15  Yes [name], MD  spironolactone (ALDACTONE) 50 MG tablet Take 1 tablet (50 mg total) by mouth daily. 10/04/16  Yes Reyne Dumas, MD  traMADol (ULTRAM) 50 MG tablet Take 1 tablet (50 mg total) by mouth every 6 (six) hours as needed. Patient taking differently: Take 50 mg by mouth every 6 (six) hours as needed (pain).  10/20/16  Yes Rai, Ripudeep K, MD  traZODone (DESYREL) 50 MG tablet Take 0.5-1 tablets (25-50 mg total) by mouth at bedtime as needed for sleep. Patient taking differently: Take 50 mg by mouth at bedtime.  10/04/16  Yes Reyne Dumas, MD  vancomycin (VANCOCIN) 50 mg/mL oral  solution Take 2.5 mLs (125 mg total) by mouth 4 (four) times daily. X 14 days Patient taking differently: Take 125 mg by mouth 4 (four) times daily. 14 day course ordered 10/20/16 10/20/16  Yes Rai, Ripudeep K, MD  zolpidem (AMBIEN) 10 MG tablet Take 1 tablet (10 mg total) by mouth at bedtime as needed for sleep. Patient taking differently: Take 10 mg by mouth at bedtime.  10/10/16 11/09/16 Yes Young, Tarri Fuller D, MD  guaiFENesin (MUCINEX) 600 MG 12 hr tablet Take 1 tablet (600 mg total) by mouth 2 (two) times daily. Patient not taking: Reported on 10/27/2016 10/20/16   Rai, Vernelle Emerald, MD  oxyCODONE-acetaminophen (PERCOCET) 5-325 MG tablet Take 1 tablet by mouth every 4 (four) hours as needed. 10/27/16   Tegan Britain, Elmyra Ricks, PA-C  senna-docusate (SENOKOT-S) 8.6-50 MG tablet Start 1 tab QHS, up to 2tabs BID. Max 4 tabs daily for constipation 10/27/16   Zamauri Nez, Elmyra Ricks, PA-C    Family History Family History  Problem Relation Age of Onset  . Hypertension Mother   . Asthma Daughter   . Cancer Paternal Aunt   . Asthma Maternal Grandmother     Social  History Social History  Substance Use Topics  . Smoking status: Former Smoker    Packs/day: 0.50    Years: 18.00    Types: Cigarettes    Quit date: 04/29/2014  . Smokeless tobacco: Never Used  . Alcohol use No     Allergies   Patient has no known allergies.   Review of Systems Review of Systems  A complete review of systems was obtained and all systems are negative except as noted in the HPI and PMH.   Physical Exam Updated Vital Signs BP (!) 176/89 (BP Location: Right Arm) Comment: taken post hemmorrhoid being lanced, pt in pain.  Pulse 97   Temp 98.3 F (36.8 C) (Oral)   Resp 20   Ht 5\' 6"  (1.676 m)   Wt (!) 149.7 kg (330 lb)   LMP 10/20/2016 (Exact Date)   SpO2 97%   BMI 53.26 kg/m   Physical Exam  Constitutional: She is oriented to person, place, and time. She appears well-developed and well-nourished. No distress.  Obese  HENT:  Head: Normocephalic and atraumatic.  Mouth/Throat: Oropharynx is clear and moist.  Eyes: Conjunctivae and EOM are normal. Pupils are equal, round, and reactive to light.  Neck: Normal range of motion.  Cardiovascular: Normal rate, regular rhythm and intact distal pulses.   Pulmonary/Chest: Effort normal.  Abdominal: Soft. There is no tenderness.  Genitourinary:  Genitourinary Comments: 3 cm external hemorrhoid dusky and exquisitely tender to palpation clear non-foul-smelling discharge profuse around rectum  Musculoskeletal: Normal range of motion.  Neurological: She is alert and oriented to person, place, and time.  Skin: She is not diaphoretic.  Psychiatric: She has a normal mood and affect.  Nursing note and vitals reviewed.    ED Treatments / Results  Labs (all labs ordered are listed, but only abnormal results are displayed) Labs Reviewed  COMPREHENSIVE METABOLIC PANEL - Abnormal; Notable for the following:       Result Value   Glucose, Bld 101 (*)    BUN 5 (*)    Calcium 8.2 (*)    Total Protein 6.0 (*)    Albumin 3.2  (*)    All other components within normal limits  CBC - Abnormal; Notable for the following:    WBC 18.8 (*)    RDW 18.8 (*)    All other  components within normal limits  I-STAT BETA HCG BLOOD, ED ([facility], [facility], [facility] ONLY)    EKG  EKG Interpretation None       Radiology Ct Abdomen Pelvis W Contrast  Result Date: 10/27/2016 CLINICAL DATA:  External hemorrhoids with pain and bleeding. EXAM: CT ABDOMEN AND PELVIS WITH CONTRAST TECHNIQUE: Multidetector CT imaging of the abdomen and pelvis was performed using the standard protocol following bolus administration of intravenous contrast. CONTRAST:  Due to difficulties with the patient's IV, she was scanned after 60 cc of Isovue 370. COMPARISON:  December 22, 2012 FINDINGS: Lower chest: No acute abnormality. Evaluation of the abdomen and pelvis is limited due to IV malfunction and patient body habitus. The study has nearly the appearance of a noncontrast enhanced study Hepatobiliary: No focal liver abnormality is seen. No gallstones, gallbladder wall thickening, or biliary dilatation. Pancreas: Unremarkable. No pancreatic ductal dilatation or surrounding inflammatory changes. Spleen: Normal in size without focal abnormality. Adrenals/Urinary Tract: Adrenal glands are unremarkable. Kidneys are normal, without renal calculi, focal lesion, or hydronephrosis. Bladder is unremarkable. Stomach/Bowel: The stomach and small bowel are normal. Colonic diverticuli are identified, particularly in the sigmoid colon. There appears to be mild stranding adjacent to the sigmoid colon on series 4, image 63 suggesting possible mild diverticulitis. The remainder of the colon is normal. The appendix is best seen on coronal images and is normal with no appendicitis. Vascular/Lymphatic: No significant vascular findings are present. No enlarged abdominal or pelvic lymph nodes. Reproductive: Uterus and bilateral adnexa are unremarkable. Other: No free air or free fluid. A fat containing  ventral hernias identified. The patient's known external hemorrhoid will be better assessed with physical exam. A rounded structure adjacent the anus on CT imaging likely represents 1 of the hemorrhoids Musculoskeletal: No acute or significant osseous findings. IMPRESSION: 1. The study is limited due to malfunction of the IV resulting in nearly a noncontrast study with only minimal contrast seen in the kidneys. 2. Diverticuli identified, primarily in the sigmoid colon. Mild adjacent stranding suggests low grade diverticulitis. Recommend clinical correlation. 3. The patient's known external hemorrhoids would be better assessed with physical exam. A rounded structure adjacent the anus is consistent with 1 of the hemorrhoids. 4. No other abnormalities. Electronically Signed   By: Dorise Bullion III M.D   On: 10/27/2016 18:27    Procedures .Marland KitchenIncision and Drainage Date/Time: 10/27/2016 9:44 PM Performed by: Monico Blitz Authorized by: Monico Blitz   Consent:    Consent obtained:  Verbal   Consent given by:  Patient Location:    Indications for incision and drainage: Thrombosed hemorrhoid.   Location:  Anogenital   Anogenital location:  Perianal Pre-procedure details:    Skin preparation:  Chloraprep Procedure type:    Complexity:  Simple Procedure details:    Incision types:  Single straight   Scalpel blade:  11   Wound management:  Probed and deloculated and irrigated with saline   Drainage:  Bloody   Wound treatment:  Wound left open   Packing materials:  None Post-procedure details:    Patient tolerance of procedure:  Tolerated well, no immediate complications   (including critical care time)  Medications Ordered in ED Medications  lidocaine (XYLOCAINE) 2 % jelly 1 application (1 application Topical Given 10/27/16 1638)  morphine 4 MG/ML injection 6 mg (6 mg Intramuscular Given 10/27/16 1637)  lidocaine (XYLOCAINE) 2 % (with pres) injection 400 mg (400 mg Infiltration Given by  Other 10/27/16 1638)  iopamidol (ISOVUE-300) 61 % injection (100  mLs  Contrast Given 10/27/16 1746)     Initial Impression / Assessment and Plan / ED Course  I have reviewed the triage vital signs and the nursing notes.  Pertinent labs & imaging results that were available during my care of the patient were reviewed by me and considered in my medical decision making (see chart for details).     Vitals:   10/27/16 1421 10/27/16 1530 10/27/16 1836 10/27/16 1900  BP:  (!) 128/54 (!) 189/104 (!) 176/89  Pulse:  95 96 97  Resp:   (!) 21 20  Temp:      TempSrc:      SpO2:  99% 96% 97%  Weight: (!) 149.7 kg (330 lb)     Height: 5\' 6"  (1.676 m)       Medications  lidocaine (XYLOCAINE) 2 % jelly 1 application (1 application Topical Given 10/27/16 1638)  morphine 4 MG/ML injection 6 mg (6 mg Intramuscular Given 10/27/16 1637)  lidocaine (XYLOCAINE) 2 % (with pres) injection 400 mg (400 mg Infiltration Given by Other 10/27/16 1638)  iopamidol (ISOVUE-300) 61 % injection (100 mLs  Contrast Given 10/27/16 1746)    TAYLR MEUTH is 46 y.o. female presenting with Painful hemorrhoids. They do appear to be thrombosed, there is a clear liquid emanating from the general area of the hemorrhoid. CT without any fistula. Incision and drainage performed with some relief. Counseled her on sitz bath advised her not to apply any creams or ointments to the open incision. General surgery referral given.  Evaluation does not show pathology that would require ongoing emergent intervention or inpatient treatment. Pt is hemodynamically stable and mentating appropriately. Discussed findings and plan with patient/guardian, who agrees with care plan. All questions answered. Return precautions discussed and outpatient follow up given.    Final Clinical Impressions(s) / ED Diagnoses   Final diagnoses:  External hemorrhoid, thrombosed    New Prescriptions Discharge Medication List as of 10/27/2016  7:03 PM    START taking  these medications   Details  oxyCODONE-acetaminophen (PERCOCET) 5-325 MG tablet Take 1 tablet by mouth every 4 (four) hours as needed., Starting Sun 10/27/2016, Print    senna-docusate (SENOKOT-S) 8.6-50 MG tablet Start 1 tab QHS, up to 2tabs BID. Max 4 tabs daily for constipation, Print         Candee Hoon, Charna Elizabeth 10/27/16 2147    Lacretia Leigh, MD 10/28/16 1932

## 2016-10-29 ENCOUNTER — Telehealth: Payer: Self-pay

## 2016-10-29 NOTE — Telephone Encounter (Signed)
Attempted to contact the patient to check on her and to discuss scheduling a follow up appointment. Call placed to # [phone] and a HIPAA compliant voicemail message was left requesting a call back to # [phone]

## 2016-10-31 ENCOUNTER — Telehealth: Payer: Self-pay

## 2016-10-31 NOTE — Telephone Encounter (Signed)
Attempted to contact the patient to check on her and to discuss scheduling a follow up appointment with the [facility]. Call placed to # [phone] (H) and a HIPAA compliant voicemail message was sent requesting a call back to # [phone]

## 2016-11-01 ENCOUNTER — Telehealth: Payer: Self-pay | Admitting: Family Medicine

## 2016-11-01 NOTE — Telephone Encounter (Signed)
Noted  

## 2016-11-01 NOTE — Telephone Encounter (Signed)
FYI  Von from [facility] states patient scored PHQ-9 score :14, [facility] is working with patient to get all medical orders in check.

## 2016-11-01 NOTE — Telephone Encounter (Signed)
Call received from [place], [place]. She reported that she has experienced difficulty contacting the patient but was able to speak to her today. Her PHQ-9 score was 14. She said that the patient reported no SI/HI and was very melancholy today.  Sharol Roussel is going to discuss with their behavioral health program.  She said that she has stressed with the patient the importance of scheduling a follow up appointment at [facility]. This CM explained that messages have been left for the patient this week requesting a call back and she has not returned the calls.   Sharol Roussel also said that she is going to assist the patient with applying for DSS transportation.

## 2016-11-08 IMAGING — DX DG CHEST 2V
2 series · 2 of 2 positions shown · non-contrast
Comparison: 08/22/2015

CLINICAL DATA: Dyspnea and wheezing.

EXAM:
CHEST  2 VIEW

[chest pa]
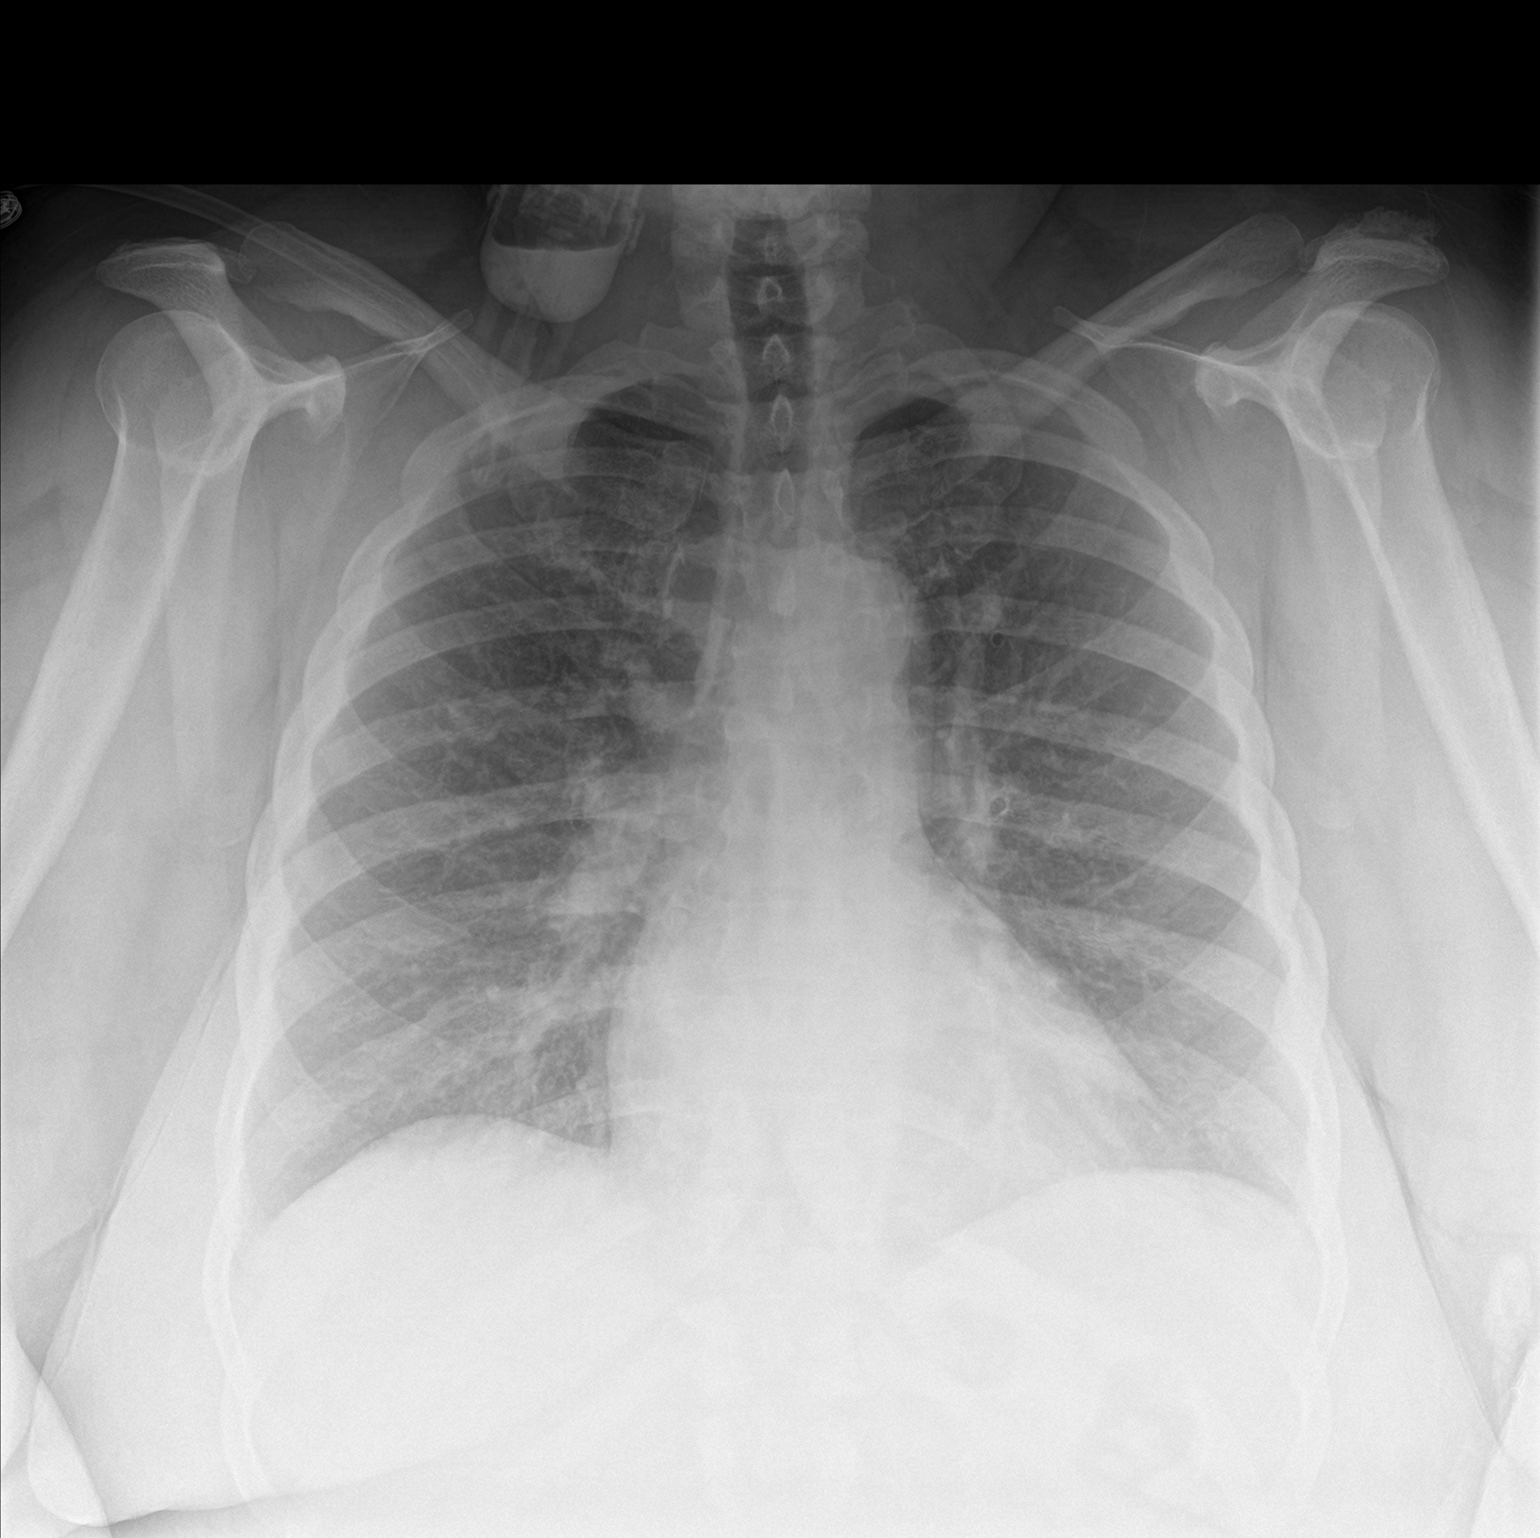

[chest lat]
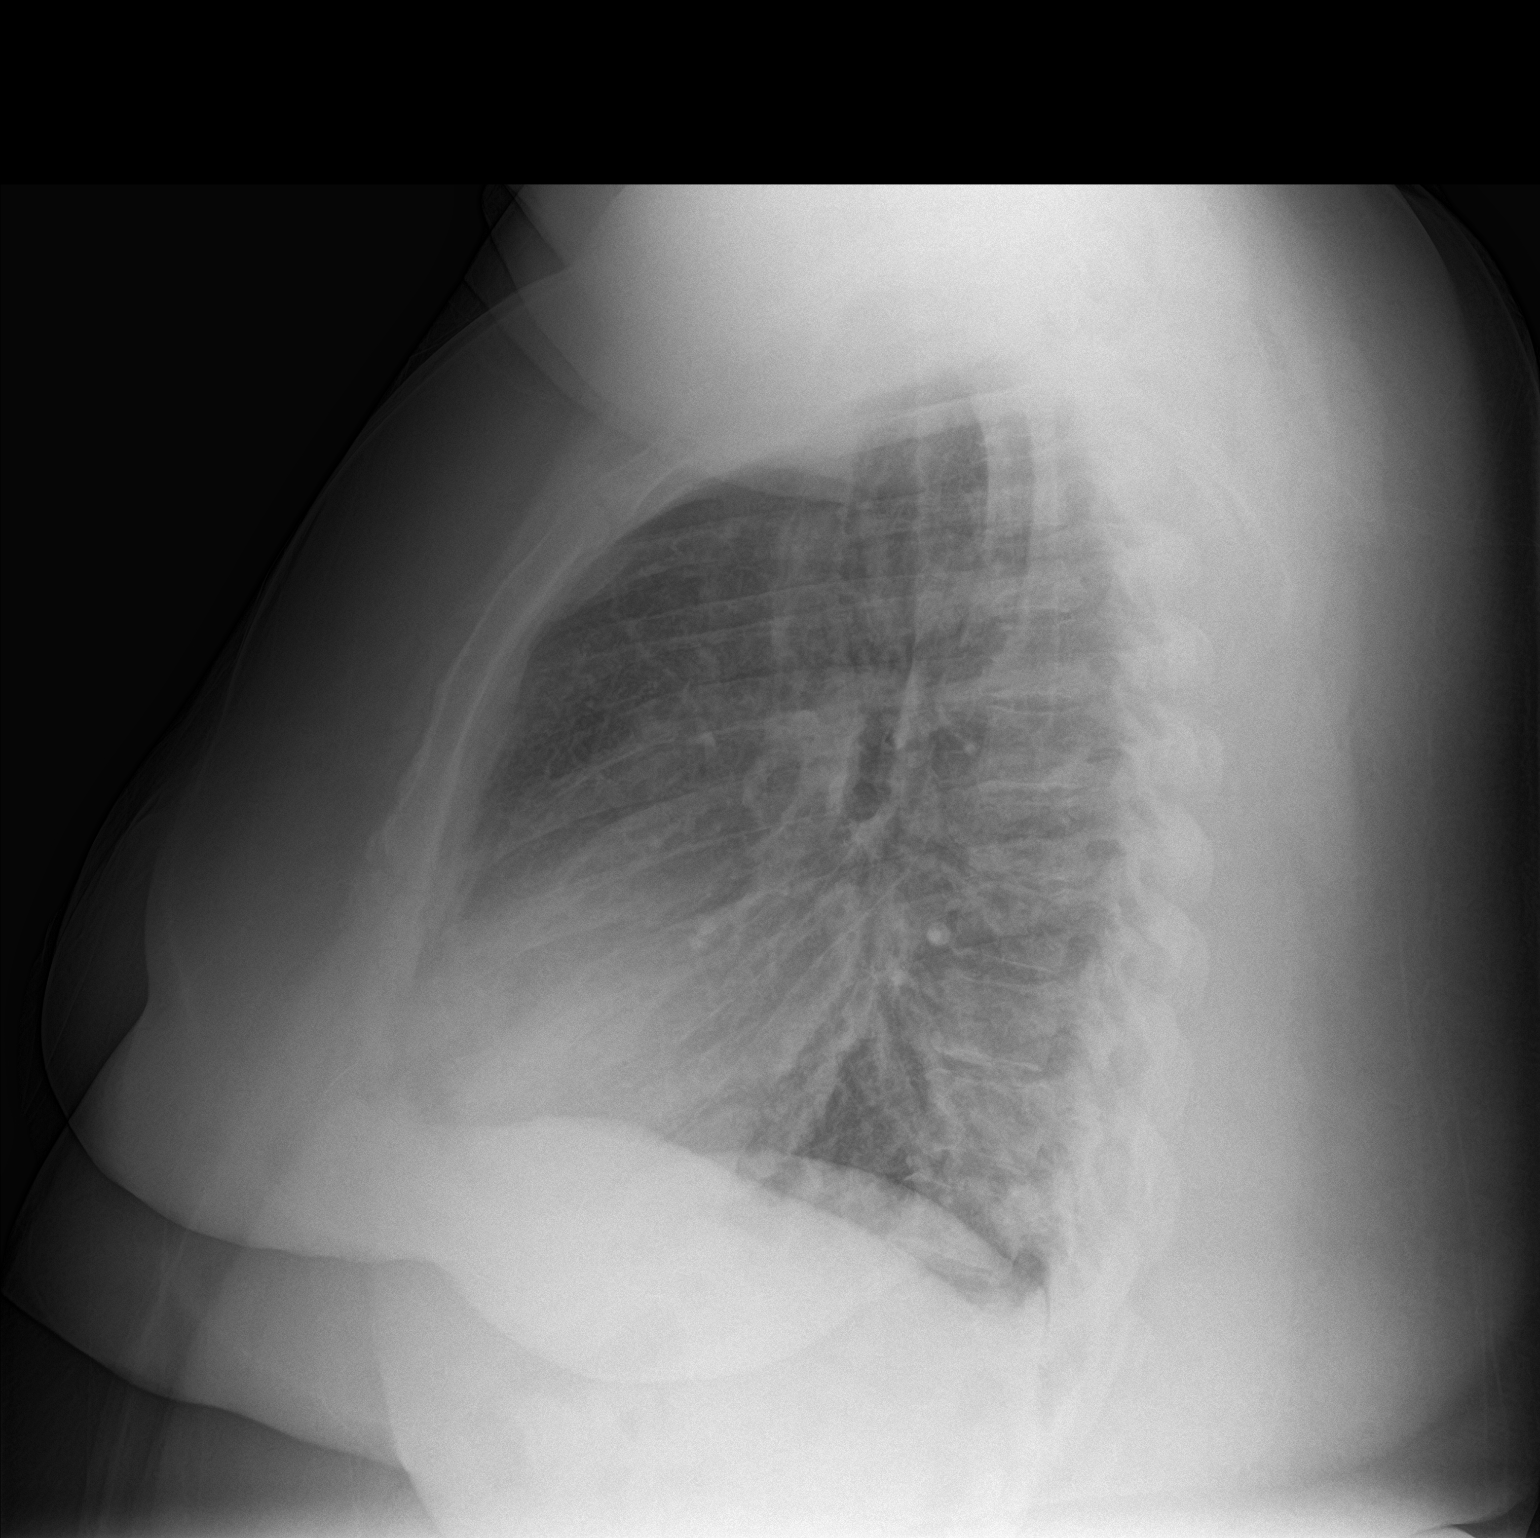

[2 of 2 positions shown; findings below may reference images not displayed]

FINDINGS: There is mild hyperinflation. The lungs are clear. The pulmonary
vasculature is normal. There is no pleural effusion.
IMPRESSION: Mild hyperinflation.

## 2016-11-13 ENCOUNTER — Telehealth: Payer: Self-pay | Admitting: Internal Medicine

## 2016-11-13 ENCOUNTER — Inpatient Hospital Stay (HOSPITAL_COMMUNITY)
Admission: EM | Admit: 2016-11-13 | Discharge: 2016-11-15 | DRG: 202 | Disposition: A | Discharge status: Home / Self Care | Payer: Medicaid Other | Attending: Internal Medicine | Admitting: Internal Medicine

## 2016-11-13 ENCOUNTER — Emergency Department (HOSPITAL_COMMUNITY): Payer: Medicaid Other

## 2016-11-13 ENCOUNTER — Telehealth: Payer: Self-pay | Admitting: Family Medicine

## 2016-11-13 ENCOUNTER — Encounter (HOSPITAL_COMMUNITY): Payer: Self-pay | Admitting: Nurse Practitioner

## 2016-11-13 DIAGNOSIS — G4733 Obstructive sleep apnea (adult) (pediatric): Secondary | ICD-10-CM | POA: Diagnosis present

## 2016-11-13 DIAGNOSIS — Z8249 Family history of ischemic heart disease and other diseases of the circulatory system: Secondary | ICD-10-CM

## 2016-11-13 DIAGNOSIS — Z6841 Body Mass Index (BMI) 40.0 and over, adult: Secondary | ICD-10-CM

## 2016-11-13 DIAGNOSIS — F40240 Claustrophobia: Secondary | ICD-10-CM | POA: Diagnosis present

## 2016-11-13 DIAGNOSIS — E876 Hypokalemia: Secondary | ICD-10-CM | POA: Diagnosis present

## 2016-11-13 DIAGNOSIS — R0902 Hypoxemia: Secondary | ICD-10-CM | POA: Diagnosis present

## 2016-11-13 DIAGNOSIS — L83 Acanthosis nigricans: Secondary | ICD-10-CM | POA: Diagnosis present

## 2016-11-13 DIAGNOSIS — J4541 Moderate persistent asthma with (acute) exacerbation: Secondary | ICD-10-CM

## 2016-11-13 DIAGNOSIS — Z7984 Long term (current) use of oral hypoglycemic drugs: Secondary | ICD-10-CM

## 2016-11-13 DIAGNOSIS — Z87891 Personal history of nicotine dependence: Secondary | ICD-10-CM

## 2016-11-13 DIAGNOSIS — G4700 Insomnia, unspecified: Secondary | ICD-10-CM | POA: Diagnosis present

## 2016-11-13 DIAGNOSIS — J449 Chronic obstructive pulmonary disease, unspecified: Secondary | ICD-10-CM | POA: Diagnosis present

## 2016-11-13 DIAGNOSIS — Z9119 Patient's noncompliance with other medical treatment and regimen: Secondary | ICD-10-CM | POA: Diagnosis not present

## 2016-11-13 DIAGNOSIS — Z720 Tobacco use: Secondary | ICD-10-CM | POA: Diagnosis not present

## 2016-11-13 DIAGNOSIS — Z7951 Long term (current) use of inhaled steroids: Secondary | ICD-10-CM

## 2016-11-13 DIAGNOSIS — D72829 Elevated white blood cell count, unspecified: Secondary | ICD-10-CM | POA: Diagnosis present

## 2016-11-13 DIAGNOSIS — F329 Major depressive disorder, single episode, unspecified: Secondary | ICD-10-CM | POA: Diagnosis present

## 2016-11-13 DIAGNOSIS — Z79899 Other long term (current) drug therapy: Secondary | ICD-10-CM | POA: Diagnosis not present

## 2016-11-13 DIAGNOSIS — K219 Gastro-esophageal reflux disease without esophagitis: Secondary | ICD-10-CM | POA: Diagnosis present

## 2016-11-13 DIAGNOSIS — Z825 Family history of asthma and other chronic lower respiratory diseases: Secondary | ICD-10-CM | POA: Diagnosis not present

## 2016-11-13 DIAGNOSIS — I10 Essential (primary) hypertension: Secondary | ICD-10-CM | POA: Diagnosis present

## 2016-11-13 DIAGNOSIS — E119 Type 2 diabetes mellitus without complications: Secondary | ICD-10-CM | POA: Diagnosis present

## 2016-11-13 DIAGNOSIS — Z9989 Dependence on other enabling machines and devices: Secondary | ICD-10-CM

## 2016-11-13 DIAGNOSIS — E872 Acidosis: Secondary | ICD-10-CM | POA: Diagnosis present

## 2016-11-13 DIAGNOSIS — R0602 Shortness of breath: Secondary | ICD-10-CM | POA: Diagnosis present

## 2016-11-13 LAB — CBC WITH DIFFERENTIAL/PLATELET
Basophils Absolute: 0 10*3/uL (ref 0.0–0.1)
Basophils Relative: 0 %
EOS PCT: 5 %
Eosinophils Absolute: 0.6 10*3/uL (ref 0.0–0.7)
HCT: 38.7 % (ref 36.0–46.0)
HEMOGLOBIN: 12.2 g/dL (ref 12.0–15.0)
LYMPHS ABS: 2.8 10*3/uL (ref 0.7–4.0)
LYMPHS PCT: 22 %
MCH: 26.8 pg (ref 26.0–34.0)
MCHC: 31.5 g/dL (ref 30.0–36.0)
MCV: 84.9 fL (ref 78.0–100.0)
MONOS PCT: 7 %
Monocytes Absolute: 0.9 10*3/uL (ref 0.1–1.0)
Neutro Abs: 8.6 10*3/uL — ABNORMAL HIGH (ref 1.7–7.7)
Neutrophils Relative %: 66 %
PLATELETS: 417 10*3/uL — AB (ref 150–400)
RBC: 4.56 MIL/uL (ref 3.87–5.11)
RDW: 18.7 % — ABNORMAL HIGH (ref 11.5–15.5)
WBC: 13 10*3/uL — AB (ref 4.0–10.5)

## 2016-11-13 LAB — URINALYSIS, ROUTINE W REFLEX MICROSCOPIC
Glucose, UA: NEGATIVE mg/dL
Hgb urine dipstick: NEGATIVE
KETONES UR: NEGATIVE mg/dL
Leukocytes, UA: NEGATIVE
Nitrite: NEGATIVE
Protein, ur: 30 mg/dL — AB
Specific Gravity, Urine: 1.023 (ref 1.005–1.030)
pH: 5 (ref 5.0–8.0)

## 2016-11-13 LAB — COMPREHENSIVE METABOLIC PANEL
ALBUMIN: 2.6 g/dL — AB (ref 3.5–5.0)
ALT: 14 U/L (ref 14–54)
AST: 26 U/L (ref 15–41)
Alkaline Phosphatase: 61 U/L (ref 38–126)
Anion gap: 9 (ref 5–15)
BUN: <5 mg/dL — ABNORMAL LOW (ref 6–20)
CHLORIDE: 103 mmol/L (ref 101–111)
CO2: 24 mmol/L (ref 22–32)
Calcium: 8.2 mg/dL — ABNORMAL LOW (ref 8.9–10.3)
Creatinine, Ser: 0.78 mg/dL (ref 0.44–1.00)
GFR calc non Af Amer: >60 mL/min (ref >60)
Glucose, Bld: 129 mg/dL — ABNORMAL HIGH (ref 65–99)
Potassium: 3 mmol/L — ABNORMAL LOW (ref 3.5–5.1)
SODIUM: 136 mmol/L (ref 135–145)
Total Bilirubin: 0.7 mg/dL (ref 0.3–1.2)
Total Protein: 6.1 g/dL — ABNORMAL LOW (ref 6.5–8.1)

## 2016-11-13 LAB — I-STAT BETA HCG BLOOD, ED (MC, WL, AP ONLY)

## 2016-11-13 LAB — GLUCOSE, CAPILLARY: GLUCOSE-CAPILLARY: 176 mg/dL — AB (ref 65–99)

## 2016-11-13 LAB — BRAIN NATRIURETIC PEPTIDE: B Natriuretic Peptide: 43.7 pg/mL (ref 0.0–100.0)

## 2016-11-13 LAB — I-STAT CG4 LACTIC ACID, ED
Lactic Acid, Venous: 3.33 mmol/L (ref 0.5–1.9)
Lactic Acid, Venous: 2.48 mmol/L (ref 0.5–1.9)

## 2016-11-13 LAB — I-STAT TROPONIN, ED: Troponin i, poc: 0 ng/mL (ref 0.00–0.08)

## 2016-11-13 LAB — MAGNESIUM: Magnesium: 1.8 mg/dL (ref 1.7–2.4)

## 2016-11-13 MED ORDER — BUDESONIDE 0.5 MG/2ML IN SUSP
0.5000 mg | Freq: Two times a day (BID) | RESPIRATORY_TRACT | Status: DC
  Administered 2016-11-13 – 2016-11-15 (×4): 0.5 mg via RESPIRATORY_TRACT
  Filled 2016-11-13 (×5): qty 2

## 2016-11-13 MED ORDER — ALBUTEROL SULFATE (2.5 MG/3ML) 0.083% IN NEBU
INHALATION_SOLUTION | RESPIRATORY_TRACT | Status: AC
  Filled 2016-11-13: qty 3

## 2016-11-13 MED ORDER — METHOCARBAMOL 500 MG PO TABS: 500.0000 mg | ORAL_TABLET | Freq: Two times a day (BID) | ORAL | Status: DC | PRN

## 2016-11-13 MED ORDER — TRAZODONE HCL 50 MG PO TABS
50.0000 mg | ORAL_TABLET | Freq: Every day | ORAL | Status: DC
  Administered 2016-11-13 – 2016-11-14 (×2): 50 mg via ORAL
  Filled 2016-11-13 (×2): qty 1

## 2016-11-13 MED ORDER — NICOTINE 14 MG/24HR TD PT24
14.0000 mg | MEDICATED_PATCH | Freq: Every day | TRANSDERMAL | Status: DC | PRN
  Administered 2016-11-13: 14 mg via TRANSDERMAL
  Filled 2016-11-13: qty 1

## 2016-11-13 MED ORDER — SODIUM CHLORIDE 0.9 % IV BOLUS (SEPSIS)
1000.0000 mL | Freq: Once | INTRAVENOUS | Status: AC
  Administered 2016-11-13: 1000 mL via INTRAVENOUS

## 2016-11-13 MED ORDER — GI COCKTAIL ~~LOC~~
30.0000 mL | Freq: Once | ORAL | Status: AC
  Administered 2016-11-13: 30 mL via ORAL
  Filled 2016-11-13: qty 30

## 2016-11-13 MED ORDER — INSULIN ASPART 100 UNIT/ML ~~LOC~~ SOLN: 0.0000 [IU] | Freq: Every day | SUBCUTANEOUS | Status: DC

## 2016-11-13 MED ORDER — ACETAMINOPHEN 325 MG PO TABS
650.0000 mg | ORAL_TABLET | Freq: Four times a day (QID) | ORAL | Status: DC | PRN
  Administered 2016-11-14: 650 mg via ORAL
  Filled 2016-11-13: qty 2

## 2016-11-13 MED ORDER — FLUTICASONE PROPIONATE 50 MCG/ACT NA SUSP: 2.0000 | Freq: Every day | NASAL | Status: DC | PRN

## 2016-11-13 MED ORDER — ALBUTEROL (5 MG/ML) CONTINUOUS INHALATION SOLN
10.0000 mg/h | INHALATION_SOLUTION | Freq: Once | RESPIRATORY_TRACT | Status: AC
  Administered 2016-11-13: 10 mg/h via RESPIRATORY_TRACT
  Filled 2016-11-13: qty 20

## 2016-11-13 MED ORDER — SODIUM CHLORIDE 0.9 % IV SOLN
INTRAVENOUS | Status: DC
  Administered 2016-11-13: 23:00:00 via INTRAVENOUS

## 2016-11-13 MED ORDER — OXYCODONE-ACETAMINOPHEN 5-325 MG PO TABS
1.0000 | ORAL_TABLET | ORAL | Status: DC | PRN
  Administered 2016-11-13 – 2016-11-15 (×4): 1 via ORAL
  Filled 2016-11-13 (×4): qty 1

## 2016-11-13 MED ORDER — GUAIFENESIN ER 600 MG PO TB12
600.0000 mg | ORAL_TABLET | Freq: Two times a day (BID) | ORAL | Status: DC
  Administered 2016-11-13 – 2016-11-15 (×4): 600 mg via ORAL
  Filled 2016-11-13 (×4): qty 1

## 2016-11-13 MED ORDER — ZOLPIDEM TARTRATE 5 MG PO TABS
5.0000 mg | ORAL_TABLET | Freq: Every day | ORAL | Status: DC
  Administered 2016-11-13 – 2016-11-14 (×2): 5 mg via ORAL
  Filled 2016-11-13 (×2): qty 1

## 2016-11-13 MED ORDER — ADULT MULTIVITAMIN W/MINERALS CH
1.0000 | ORAL_TABLET | Freq: Every day | ORAL | Status: DC
  Administered 2016-11-13 – 2016-11-15 (×3): 1 via ORAL
  Filled 2016-11-13 (×3): qty 1

## 2016-11-13 MED ORDER — ONDANSETRON HCL 4 MG/2ML IJ SOLN
4.0000 mg | Freq: Once | INTRAMUSCULAR | Status: AC
  Administered 2016-11-13: 4 mg via INTRAVENOUS
  Filled 2016-11-13: qty 2

## 2016-11-13 MED ORDER — IPRATROPIUM-ALBUTEROL 0.5-2.5 (3) MG/3ML IN SOLN
3.0000 mL | Freq: Four times a day (QID) | RESPIRATORY_TRACT | Status: DC
  Administered 2016-11-13 – 2016-11-14 (×2): 3 mL via RESPIRATORY_TRACT
  Filled 2016-11-13: qty 3

## 2016-11-13 MED ORDER — IPRATROPIUM BROMIDE 0.02 % IN SOLN
1.0000 mg | Freq: Once | RESPIRATORY_TRACT | Status: AC
  Administered 2016-11-13: 1 mg via RESPIRATORY_TRACT
  Filled 2016-11-13: qty 5

## 2016-11-13 MED ORDER — IPRATROPIUM-ALBUTEROL 0.5-2.5 (3) MG/3ML IN SOLN
3.0000 mL | Freq: Once | RESPIRATORY_TRACT | Status: DC
  Filled 2016-11-13: qty 3

## 2016-11-13 MED ORDER — ALBUTEROL SULFATE (2.5 MG/3ML) 0.083% IN NEBU
5.0000 mg | INHALATION_SOLUTION | Freq: Once | RESPIRATORY_TRACT | Status: AC
  Administered 2016-11-13: 5 mg via RESPIRATORY_TRACT

## 2016-11-13 MED ORDER — ACETAMINOPHEN 650 MG RE SUPP: 650.0000 mg | Freq: Four times a day (QID) | RECTAL | Status: DC | PRN

## 2016-11-13 MED ORDER — TRAMADOL HCL 50 MG PO TABS: 50.0000 mg | ORAL_TABLET | Freq: Four times a day (QID) | ORAL | Status: DC | PRN

## 2016-11-13 MED ORDER — LORATADINE 10 MG PO TABS
10.0000 mg | ORAL_TABLET | Freq: Every day | ORAL | Status: DC
  Administered 2016-11-13 – 2016-11-15 (×3): 10 mg via ORAL
  Filled 2016-11-13 (×3): qty 1

## 2016-11-13 MED ORDER — METHYLPREDNISOLONE SODIUM SUCC 125 MG IJ SOLR
60.0000 mg | Freq: Three times a day (TID) | INTRAMUSCULAR | Status: DC
  Administered 2016-11-14: 60 mg via INTRAVENOUS
  Filled 2016-11-13: qty 2

## 2016-11-13 MED ORDER — SERTRALINE HCL 50 MG PO TABS
50.0000 mg | ORAL_TABLET | Freq: Every day | ORAL | Status: DC
  Administered 2016-11-14 – 2016-11-15 (×2): 50 mg via ORAL
  Filled 2016-11-13 (×2): qty 1

## 2016-11-13 MED ORDER — HYDRALAZINE HCL 20 MG/ML IJ SOLN: 10.0000 mg | INTRAMUSCULAR | Status: DC | PRN

## 2016-11-13 MED ORDER — LOSARTAN POTASSIUM 50 MG PO TABS
100.0000 mg | ORAL_TABLET | Freq: Every day | ORAL | Status: DC
  Administered 2016-11-14 – 2016-11-15 (×2): 100 mg via ORAL
  Filled 2016-11-13 (×2): qty 2

## 2016-11-13 MED ORDER — ENOXAPARIN SODIUM 40 MG/0.4ML ~~LOC~~ SOLN: 40.0000 mg | SUBCUTANEOUS | Status: DC

## 2016-11-13 MED ORDER — FUROSEMIDE 20 MG PO TABS
20.0000 mg | ORAL_TABLET | Freq: Every day | ORAL | Status: DC
  Administered 2016-11-14 – 2016-11-15 (×2): 20 mg via ORAL
  Filled 2016-11-13 (×2): qty 1

## 2016-11-13 MED ORDER — IPRATROPIUM-ALBUTEROL 0.5-2.5 (3) MG/3ML IN SOLN
3.0000 mL | RESPIRATORY_TRACT | Status: DC | PRN
  Administered 2016-11-14 – 2016-11-15 (×2): 3 mL via RESPIRATORY_TRACT
  Filled 2016-11-13 (×2): qty 3

## 2016-11-13 MED ORDER — ONDANSETRON HCL 4 MG PO TABS: 4.0000 mg | ORAL_TABLET | Freq: Four times a day (QID) | ORAL | Status: DC | PRN

## 2016-11-13 MED ORDER — ONDANSETRON HCL 4 MG/2ML IJ SOLN: 4.0000 mg | Freq: Four times a day (QID) | INTRAMUSCULAR | Status: DC | PRN

## 2016-11-13 MED ORDER — MAGNESIUM SULFATE 2 GM/50ML IV SOLN
2.0000 g | Freq: Once | INTRAVENOUS | Status: AC
  Administered 2016-11-13: 2 g via INTRAVENOUS
  Filled 2016-11-13: qty 50

## 2016-11-13 MED ORDER — AMLODIPINE BESYLATE 10 MG PO TABS
10.0000 mg | ORAL_TABLET | Freq: Every day | ORAL | Status: DC
  Administered 2016-11-14 – 2016-11-15 (×2): 10 mg via ORAL
  Filled 2016-11-13 (×2): qty 1

## 2016-11-13 MED ORDER — INSULIN ASPART 100 UNIT/ML ~~LOC~~ SOLN
0.0000 [IU] | Freq: Three times a day (TID) | SUBCUTANEOUS | Status: DC
  Administered 2016-11-14 (×3): 3 [IU] via SUBCUTANEOUS
  Administered 2016-11-15 (×2): 2 [IU] via SUBCUTANEOUS

## 2016-11-13 MED ORDER — PANTOPRAZOLE SODIUM 40 MG PO TBEC
40.0000 mg | DELAYED_RELEASE_TABLET | Freq: Every day | ORAL | Status: DC
  Administered 2016-11-13 – 2016-11-15 (×3): 40 mg via ORAL
  Filled 2016-11-13 (×3): qty 1

## 2016-11-13 MED ORDER — ARFORMOTEROL TARTRATE 15 MCG/2ML IN NEBU
15.0000 ug | INHALATION_SOLUTION | Freq: Two times a day (BID) | RESPIRATORY_TRACT | Status: DC
  Administered 2016-11-13 – 2016-11-15 (×4): 15 ug via RESPIRATORY_TRACT
  Filled 2016-11-13 (×5): qty 2

## 2016-11-13 MED ORDER — POTASSIUM CHLORIDE CRYS ER 20 MEQ PO TBCR
60.0000 meq | EXTENDED_RELEASE_TABLET | ORAL | Status: AC
  Administered 2016-11-13: 60 meq via ORAL
  Filled 2016-11-13: qty 3

## 2016-11-13 MED ORDER — SPIRONOLACTONE 50 MG PO TABS
50.0000 mg | ORAL_TABLET | Freq: Every day | ORAL | Status: DC
  Administered 2016-11-14 – 2016-11-15 (×2): 50 mg via ORAL
  Filled 2016-11-13 (×2): qty 1

## 2016-11-13 MED ORDER — METHYLPREDNISOLONE SODIUM SUCC 125 MG IJ SOLR
125.0000 mg | Freq: Once | INTRAMUSCULAR | Status: AC
Start: 2016-11-13 — End: 2016-11-13
  Administered 2016-11-13: 125 mg via INTRAVENOUS
  Filled 2016-11-13: qty 2

## 2016-11-13 NOTE — ED Notes (Signed)
Dr Dayna Barker notified of Lactic of 3.3

## 2016-11-13 NOTE — ED Notes (Signed)
Elevated CG-4 reported to [facility] at [facility]

## 2016-11-13 NOTE — ED Triage Notes (Addendum)
Pt presents with c/o asthma exacerbation. Her symptoms began yesterday.  She reports generalized body aches, chills, cough, nausea, vomiting. She has been using dulera daily as directed and using recue albuterol increasingly, about 5-6 times per day, for the past 2 days.

## 2016-11-13 NOTE — Telephone Encounter (Signed)
CY  Please Advise-Sick Message  Patient called in and stated that she is having a flair-up. She is coughing with yellow mucus,lots of wheezing, body aches,unsure of a fever she did not check, not much of appetite has not eaten per patient in two days, increase sob, has given herself breathing treatment and tried rescue inhaler but no relief, Denies increased chest tightness. Front staff scheduled her an appointment for this Friday 11/15/16.  She states she has not picked up the prednisone you prescribed because she was trying to avoid taking it but unsure if that would be the next step until she sees you on Friday  No Known Allergies

## 2016-11-13 NOTE — Progress Notes (Signed)
New Admission Note:  Arrival Method: Via stretcher from ED Mental Orientation: Alert & Oriented x4 Telemetry: CCMD verified Assessment: Completed Skin: Refer to flowsheet IV: Right Anterior Forearm Pain: 10/10 Tubes: None Safety Measures: Safety Fall Prevention Plan discussed with patient. Admission: Completed [facility] Orientation: Patient has been orientated to the room, unit and the staff.  Orders have been reviewed and implemented. Will continue to monitor the patient. Call light has been placed within reach.   Vassie Moselle, RN  Phone Number: [phone]

## 2016-11-13 NOTE — Telephone Encounter (Signed)
Pt called to request an appt with her PCP, at the time I try to offer the appt another operator took the appt, I inform Pt that I lost the appt, and since she said that her asthma is acting up she can go to the Urgent care or the Ed to be see, she did not like that, she want to schedule an appt and she did not want to wait until 11/21/16 that was the next appt available....Marland KitchenMarland Kitchen

## 2016-11-13 NOTE — Telephone Encounter (Signed)
FYI- pt in ED currently.  Will route to CY to make aware.

## 2016-11-13 NOTE — Telephone Encounter (Signed)
Call placed to [phone] (pt's phone) to check on her status and schedule an appointment. No one answered and no voicemail available to leave a message.

## 2016-11-13 NOTE — H&P (Signed)
History and Physical    April BRERETON RKY:[id number] DOB: [birth year] DOA: 11/13/2016  Referring MD/NP/PA: Dr. Lindell Noe PCP: Boykin Nearing, MD  Patient coming from:   Chief Complaint: Shortness of breath  HPI: April [name] is a 46 y.o. female with medical history significant of moderate persistent asthma followed by Dr. Annamaria Boots of pulmonology, asthma, morbid obesity, GERD, C.diff, OSA noncompliant with CPAP due to claustrophobia; who presents with complaints of approximately 2 days of worsening shortness of breath and cough. Patient notes that her boyfriend had recently been sick with a "cold", and she thinks that he passed it to her. Patient reports having a nonproductive cough with wheezing or she's been unable to sleep at night. Yesterday patient reports having coughing spells to the point where she vomited. Associated symptoms include decreased appetite and severe abdominal cramps. Patient reports using her albuterol inhaler up to 6 times per day, and activity seem to worsen symptoms. She also reports using her nebulizer machine 4-5 times per day with only mild relief. Patient was just recently hospitalized last month from 6/22-6/24 for similar symptoms and was also found to have C. difficile. Denies having any significant fever, chills, chest pain, loss of consciousness, diarrhea, or bleeding. Patient is followed by Dr. Annamaria Boots of pulmonology and is an appointment with him set for this Friday. Patient reports that she has not been smoking and has been using the nicotine patches.  ED Course: Upon admission into the emergency department patient was seen to be afebrile, pulse 91-107, blood pressure 171/94, respirations 21-22, and O2 saturation maintained on RA. This revealed WBC 13, platelets 417, potassium 3, lactic acid 3.33. CXR revealed mild chronic peribronchial thickening, but no acute infiltrate. Patient was given 1 L of fluids, 2 g of magnesium sulfate, 125 mg Solu-Medrol, 1 hour-long  continuous albuterol neb, and 2 albuterol treatments with some mild improvement in symptoms.  Review of Systems: Review of Systems  Constitutional: Negative for chills and fever.  HENT: Negative for ear discharge and nosebleeds.   Eyes: Negative for double vision and photophobia.  Respiratory: Positive for cough, sputum production, shortness of breath and wheezing. Negative for hemoptysis.   Cardiovascular: Negative for chest pain, palpitations and orthopnea.  Gastrointestinal: Positive for abdominal pain. Negative for blood in stool, constipation, diarrhea, nausea and vomiting.  Genitourinary: Negative for frequency and urgency.  Musculoskeletal: Negative for falls.       Positive for muscle cramps  Skin: Negative for itching and rash.  Neurological: Negative for sensory change and speech change.  Endo/Heme/Allergies: Negative for polydipsia.  Psychiatric/Behavioral: Negative for substance abuse and suicidal ideas.    Past Medical History:  Diagnosis Date  . Acanthosis nigricans   . Arthritis   . Asthma    Followed by Dr. Annamaria Boots (pulmonology); receives every other week omalizumab injections; has frequent exacerbations  . COPD (chronic obstructive pulmonary disease) ([place])    PFTs in 2002, FEV1/FVC 65, no post bronchodilater test done  . Depression   . GERD (gastroesophageal reflux disease)   . Headache(784.0)   . Helicobacter pylori (H. pylori) infection   . Hypertension, essential   . Insomnia   . Menorrhagia   . Morbid obesity ([place])   . Obesity   . Seasonal allergies   . Shortness of breath   . Sleep apnea    Sleep study 2008 - mild OSA, not enough events to titrate CPAP  . Tobacco user     Past Surgical History:  Procedure Laterality Date  .  BREAST REDUCTION SURGERY  09/2011  . TUBAL LIGATION  1996   bilateral     reports that she quit smoking about 2 years ago. Her smoking use included Cigarettes. She has a 9.00 pack-year smoking history. She has never used  smokeless tobacco. She reports that she does not drink alcohol or use drugs.  No Known Allergies  Family History  Problem Relation Age of Onset  . Hypertension Mother   . Asthma Daughter   . Cancer Paternal Aunt   . Asthma Maternal Grandmother     Prior to Admission medications   Medication Sig Start Date End Date Taking? Authorizing Provider  albuterol (PROAIR HFA) 108 (90 Base) MCG/ACT inhaler Inhale 2 puffs into the lungs every 6 (six) hours as needed for wheezing or shortness of breath. 10/04/16  Yes Reyne Dumas, MD  albuterol (PROVENTIL) (2.5 MG/3ML) 0.083% nebulizer solution Take 3 mLs (2.5 mg total) by nebulization every 4 (four) hours as needed for wheezing or shortness of breath. 10/04/16  Yes Reyne Dumas, MD  amLODipine (NORVASC) 10 MG tablet Take 1 tablet (10 mg total) by mouth daily. 05/03/16  Yes Funches, Josalyn, MD  fluticasone (FLONASE) 50 MCG/ACT nasal spray Place 2 sprays into both nostrils daily. Patient taking differently: Place 2 sprays into both nostrils daily as needed (congestion).  10/20/16  Yes Rai, Ripudeep K, MD  furosemide (LASIX) 20 MG tablet Take 1 tablet (20 mg total) by mouth daily. 10/05/16  Yes Reyne Dumas, MD  ipratropium (ATROVENT) 0.02 % nebulizer solution Take 2.5 mLs (0.5 mg total) by nebulization 4 (four) times daily as needed for wheezing or shortness of breath. 06/06/16  Yes Young, Tarri Fuller D, MD  loratadine (CLARITIN) 10 MG tablet Take 1 tablet (10 mg total) by mouth daily. 08/30/15  Yes [name], MD  losartan (COZAAR) 100 MG tablet Take 1 tablet (100 mg total) by mouth daily. 10/20/16  Yes Rai, Ripudeep K, MD  metFORMIN (GLUCOPHAGE) 500 MG tablet Take 1 tablet (500 mg total) by mouth 2 (two) times daily with a meal. 05/07/16  Yes Vann, Jessica U, DO  methocarbamol (ROBAXIN) 500 MG tablet Take 1 tablet (500 mg total) by mouth 2 (two) times daily. Patient taking differently: Take 500 mg by mouth 2 (two) times daily as needed for muscle spasms.  10/20/16   Yes Rai, Ripudeep K, MD  mometasone-formoterol (DULERA) 200-5 MCG/ACT AERO Inhale 2 puffs into the lungs 2 (two) times daily. Patient taking differently: Inhale 1 puff into the lungs 2 (two) times daily.  06/06/16  Yes Young, Tarri Fuller D, MD  Multiple Vitamin (MULTIVITAMIN WITH MINERALS) TABS tablet Take 1 tablet by mouth daily. One-A-Day   Yes [provider]  nicotine (NICODERM CQ - DOSED IN MG/24 HOURS) 14 mg/24hr patch Place 1 patch (14 mg total) onto the skin daily. Patient taking differently: Place 14 mg onto the skin daily as needed (smoking cessation).  10/04/16  Yes Reyne Dumas, MD  omeprazole (PRILOSEC) 20 MG capsule Take 1 capsule (20 mg total) by mouth daily. Patient taking differently: Take 20 mg by mouth at bedtime.  10/09/16  Yes Young, Tarri Fuller D, MD  oxyCODONE-acetaminophen (PERCOCET) 5-325 MG tablet Take 1 tablet by mouth every 4 (four) hours as needed. 10/27/16  Yes Pisciotta, Elmyra Ricks, PA-C  senna-docusate (SENOKOT-S) 8.6-50 MG tablet Start 1 tab QHS, up to 2tabs BID. Max 4 tabs daily for constipation 10/27/16  Yes Pisciotta, Elmyra Ricks, PA-C  sertraline (ZOLOFT) 50 MG tablet Take 1 tablet (50 mg total) by mouth  daily. 08/30/15  Yes [name], MD  spironolactone (ALDACTONE) 50 MG tablet Take 1 tablet (50 mg total) by mouth daily. 10/04/16  Yes Reyne Dumas, MD  traMADol (ULTRAM) 50 MG tablet Take 1 tablet (50 mg total) by mouth every 6 (six) hours as needed. Patient taking differently: Take 50 mg by mouth every 6 (six) hours as needed (pain).  10/20/16  Yes Rai, Ripudeep K, MD  traZODone (DESYREL) 50 MG tablet Take 0.5-1 tablets (25-50 mg total) by mouth at bedtime as needed for sleep. Patient taking differently: Take 50 mg by mouth at bedtime.  10/04/16  Yes Reyne Dumas, MD  zolpidem (AMBIEN) 10 MG tablet Take 1 tablet (10 mg total) by mouth at bedtime as needed for sleep. Patient taking differently: Take 10 mg by mouth at bedtime.  10/10/16 11/13/16 Yes Young, Tarri Fuller D, MD  guaiFENesin  (MUCINEX) 600 MG 12 hr tablet Take 1 tablet (600 mg total) by mouth 2 (two) times daily. Patient not taking: Reported on 10/27/2016 10/20/16   Rai, Vernelle Emerald, MD  predniSONE (DELTASONE) 10 MG tablet Prednisone dosing: Take  Prednisone 40mg  (4 tabs) x 3 days, then taper to 30mg  (3 tabs) x 3 days, then 20mg  (2 tabs) x 3days, then 10mg  (1 tab) x 3days, then OFF. Patient not taking: Reported on 11/13/2016 10/20/16   Rai, Vernelle Emerald, MD  vancomycin (VANCOCIN) 50 mg/mL oral solution Take 2.5 mLs (125 mg total) by mouth 4 (four) times daily. X 14 days Patient not taking: Reported on 11/13/2016 10/20/16   Mendel Corning, MD    Physical Exam:  Constitutional:Morbidly obese female in mild respiratory distress  Vitals:   11/13/16 1548 11/13/16 1735 11/13/16 1800  BP: (!) 171/94 133/80 (!) 141/96  Pulse: (!) 107 91   Resp: (!) 21 (!) 22   Temp: 98.3 F (36.8 C)    TempSrc: Oral    SpO2: 98% 99%    Eyes: PERRL, lids and conjunctivae normal ENMT: Mucous membranes are moist. Posterior pharynx clear of any exudate or lesions.Normal dentition.  Neck: normal, supple, no masses, no thyromegaly Respiratory: Mildly tachypneic with positive expiratory wheezes and decreased overall aeration.  Cardiovascular:Tachycardic, no murmurs / rubs / gallops.Trace lower extremity edema. 2+ pedal pulses. No carotid bruits.  Abdomen: no tenderness, no masses palpated. No hepatosplenomegaly. Bowel sounds positive.  Musculoskeletal: no clubbing / cyanosis. No joint deformity upper and lower extremities. Good ROM, no contractures. Normal muscle tone.  Skin: no rashes, lesions, ulcers. No induration Neurologic: CN 2-12 grossly intact. Sensation intact, DTR normal. Strength 5/5 in all 4.  Psychiatric: Normal judgment and insight. Alert and oriented x 3. Normal mood.     Labs on Admission: I have personally reviewed following labs and imaging studies  CBC:  Recent Labs Lab 11/13/16 1636  WBC 13.0*  NEUTROABS 8.6*  HGB  12.2  HCT 38.7  MCV 84.9  PLT 269*   Basic Metabolic Panel:  Recent Labs Lab 11/13/16 1636  NA 136  K 3.0*  CL 103  CO2 24  GLUCOSE 129*  BUN <5*  CREATININE 0.78  CALCIUM 8.2*   GFR: CrCl cannot be calculated (Unknown ideal weight.). Liver Function Tests:  Recent Labs Lab 11/13/16 1636  AST 26  ALT 14  ALKPHOS 61  BILITOT 0.7  PROT 6.1*  ALBUMIN 2.6*   No results for input(s): LIPASE, AMYLASE in the last 168 hours. No results for input(s): AMMONIA in the last 168 hours. Coagulation Profile: No results for input(s): INR, PROTIME  in the last 168 hours. Cardiac Enzymes: No results for input(s): CKTOTAL, CKMB, CKMBINDEX, TROPONINI in the last 168 hours. BNP (last 3 results) No results for input(s): PROBNP in the last 8760 hours. HbA1C: No results for input(s): HGBA1C in the last 72 hours. CBG: No results for input(s): GLUCAP in the last 168 hours. Lipid Profile: No results for input(s): CHOL, HDL, LDLCALC, TRIG, CHOLHDL, LDLDIRECT in the last 72 hours. Thyroid Function Tests: No results for input(s): TSH, T4TOTAL, FREET4, T3FREE, THYROIDAB in the last 72 hours. Anemia Panel: No results for input(s): VITAMINB12, FOLATE, FERRITIN, TIBC, IRON, RETICCTPCT in the last 72 hours. Urine analysis:    Component Value Date/Time   COLORURINE YELLOW 11/21/2014 0707   APPEARANCEUR CLOUDY (A) 11/21/2014 0707   LABSPEC 1.024 11/21/2014 0707   PHURINE 6.0 11/21/2014 0707   GLUCOSEU NEGATIVE 11/21/2014 0707   GLUCOSEU NEG mg/dL 10/28/2007 2049   HGBUR TRACE (A) 11/21/2014 0707   BILIRUBINUR NEGATIVE 11/21/2014 0707   KETONESUR NEGATIVE 11/21/2014 0707   PROTEINUR NEGATIVE 11/21/2014 0707   UROBILINOGEN 1.0 11/21/2014 0707   NITRITE NEGATIVE 11/21/2014 0707   LEUKOCYTESUR NEGATIVE 11/21/2014 0707   Sepsis Labs: No results found for this or any previous visit (from the past 240 hour(s)).   Radiological Exams on Admission: Dg Chest 2 View  Result Date:  11/13/2016 CLINICAL DATA:  Asthma exacerbation. EXAM: CHEST  2 VIEW COMPARISON:  10/18/2016 FINDINGS: The cardiomediastinal silhouette is within normal limits. The lungs are well inflated. Mild peribronchial thickening is similar to the prior study. No confluent airspace opacity, overt pulmonary edema, pleural effusion, or pneumothorax is identified. No acute osseous abnormality is seen. IMPRESSION: Mild chronic peribronchial thickening which may reflect reactive airways disease. No consolidation to suggest pneumonia. Electronically Signed   By: Logan Bores M.D.   On: 11/13/2016 16:28    Chest x-ray: Independently reviewed. Appreciate the mild peribronchial thickening in no acute infiltrate appreciated.  Assessment/Plan Moderate persistent asthma with exacerbation: Acute. Patient presents for shortness of breath and cough for last 2 days. Suspect possible viral analogy to symptoms. Patient had been able to maintain O2 saturations on room air. given 2 g of magnesium sulfate, 125 mg Solu-Medrol, 1 hour-long continuous albuterol neb, and multiple albuterol nebs without relief of symptoms. - Admit to a telemetry bed  - IV Solu-Medrol, Duonebs, scheduled brovana, Pulmicort - Continue Claritin and fluticasone - Mucinex  Lactic acidosis: Acute.Lactic acid 3.33 admission. Suspect this is noninfectious and likely related to multiple - Recheck lactic acid  Leukocytosis: Chronic. WBC elevated at 11.3 but appears to lower than previously seen in the past. Could be related to steroids. - Continue to monitor  Hypokalemia: Acute. Initial potassium 3 on admission. Likely cause of patient's cramps. - Give 60 mEq of potassium chloride 1 dose now - Continue to monitor and replace as needed  Essential hypertension, uncontrolled: Patient's initial blood pressure is elevated to 171/94. - Continue Norvasc, Lasix, Aldactone, and losartan  - hydralazine IV prn >sBP  Type 2 diabetes mellitus: without complication,  without long-term current use of insulin: Last hemoglobin A1c on file from 05/03/2016 was 6.4. - Patient specifically requests to be on a regular diet - hold metformin - CBGs every before meals and at bedtime with Moderate SSI while on steroids.  Morbid obesity with body mass index of 50.0-59.9 in adult  - will need to counseled on need of weight loss  OSA - Noncompliant with CPAP  Tobacco abuse: Patient reports that she is not smoke  tobacco recently. - Continue nicotine patch - Counseled strongly on tobacco cessation, continue nicotine patch  GERD - Continue protonix  DVT prophylaxis: Lovenox Code Status: Full Family Communication: No family present at bedside  Disposition Plan: Likely discharge home in 2-3 days  Consults called: None Admission status:Inpatient   [name] MD [facility] Pager [phone]  If 7PM-7AM, please contact night-coverage [email or web address] Password [email or web address]  11/13/2016, 7:48 PM

## 2016-11-13 NOTE — ED Provider Notes (Signed)
[facility] DEPT Provider Note   CSN: [id number] Arrival date & time: 11/13/16  1542     History   Chief Complaint Chief Complaint  Patient presents with  . Asthma    HPI April [name] is a 46 y.o. female.  HPI 46 year old female with past medical history as above including recurrent admissions for asthma attacks here with cough and wheezing. The patient states that her significant other recently has had a cough and nasal congestion. Over the last 2 days, she's had progressively worsening cough, wheezing, and difficulty breathing. She's been using her inhaler every time she walks due to the shortness of breath. She has had associated decreased appetite as well as diffuse body aches and pains. Her symptoms feel similar to her usual symptoms during asthma exacerbations. She denies any fevers or sputum production. Denies any current chest pain. Denies any lower extremity swelling.  Past Medical History:  Diagnosis Date  . Acanthosis nigricans   . Arthritis   . Asthma    Followed by Dr. Annamaria Boots (pulmonology); receives every other week omalizumab injections; has frequent exacerbations  . COPD (chronic obstructive pulmonary disease) ([place])    PFTs in 2002, FEV1/FVC 65, no post bronchodilater test done  . Depression   . GERD (gastroesophageal reflux disease)   . Headache(784.0)   . Helicobacter pylori (H. pylori) infection   . Hypertension, essential   . Insomnia   . Menorrhagia   . Morbid obesity ([place])   . Obesity   . Seasonal allergies   . Shortness of breath   . Sleep apnea    Sleep study 2008 - mild OSA, not enough events to titrate CPAP  . Tobacco user     Patient Active Problem List   Diagnosis Date Noted  . Moderate persistent asthma with (acute) exacerbation 11/13/2016  . Moderate persistent asthma with exacerbation 10/19/2016  . Diarrhea 10/19/2016  . Type 2 diabetes mellitus without complication, without long-term current use of insulin ([place]) 10/02/2016  .  Normocytic anemia 10/02/2016  . Respiratory failure ([place]) 10/02/2016  . Hypertensive urgency, malignant 06/22/2016  . Pulmonary edema 06/22/2016  . Hypertensive urgency 06/22/2016  . Malignant hypertension due to primary aldosteronism ([place]) 05/05/2016  . Lip laceration 05/05/2016  . Elevated hemoglobin A1c 05/05/2016  . Asthma exacerbation 11/10/2015  . GERD (gastroesophageal reflux disease) 08/30/2015  . Generalized anxiety disorder 08/30/2015  . Seasonal allergic rhinitis 08/29/2013  . Leukocytosis 10/21/2012  . Tobacco abuse 10/07/2012  . Asthma, chronic obstructive, without status asthmaticus ([place]) 05/07/2012  . Knee pain, bilateral 04/25/2011  . Primary insomnia 03/14/2011  . Mild obstructive sleep apnea 12/19/2010  . Hypokalemia 08/13/2010  . Cervical back pain with evidence of disc disease 04/08/2008  . Essential hypertension 07/31/2006  . Morbid obesity with body mass index of 50.0-59.9 in adult ([place]) 06/17/2006  . Major depressive disorder, recurrent episode ([place]) 04/10/2006    Past Surgical History:  Procedure Laterality Date  . BREAST REDUCTION SURGERY  09/2011  . TUBAL LIGATION  1996   bilateral    OB History    No data available       Home Medications    Prior to Admission medications   Medication Sig Start Date End Date Taking? Authorizing Provider  albuterol (PROAIR HFA) 108 (90 Base) MCG/ACT inhaler Inhale 2 puffs into the lungs every 6 (six) hours as needed for wheezing or shortness of breath. 10/04/16  Yes Reyne Dumas, MD  albuterol (PROVENTIL) (2.5 MG/3ML) 0.083% nebulizer solution Take 3 mLs (2.5  mg total) by nebulization every 4 (four) hours as needed for wheezing or shortness of breath. 10/04/16  Yes Reyne Dumas, MD  amLODipine (NORVASC) 10 MG tablet Take 1 tablet (10 mg total) by mouth daily. 05/03/16  Yes Funches, Josalyn, MD  fluticasone (FLONASE) 50 MCG/ACT nasal spray Place 2 sprays into both nostrils daily. Patient taking differently: Place 2  sprays into both nostrils daily as needed (congestion).  10/20/16  Yes Rai, Ripudeep K, MD  furosemide (LASIX) 20 MG tablet Take 1 tablet (20 mg total) by mouth daily. 10/05/16  Yes Reyne Dumas, MD  ipratropium (ATROVENT) 0.02 % nebulizer solution Take 2.5 mLs (0.5 mg total) by nebulization 4 (four) times daily as needed for wheezing or shortness of breath. 06/06/16  Yes Young, Tarri Fuller D, MD  loratadine (CLARITIN) 10 MG tablet Take 1 tablet (10 mg total) by mouth daily. 08/30/15  Yes [name], MD  losartan (COZAAR) 100 MG tablet Take 1 tablet (100 mg total) by mouth daily. 10/20/16  Yes Rai, Ripudeep K, MD  metFORMIN (GLUCOPHAGE) 500 MG tablet Take 1 tablet (500 mg total) by mouth 2 (two) times daily with a meal. 05/07/16  Yes Vann, Jessica U, DO  methocarbamol (ROBAXIN) 500 MG tablet Take 1 tablet (500 mg total) by mouth 2 (two) times daily. Patient taking differently: Take 500 mg by mouth 2 (two) times daily as needed for muscle spasms.  10/20/16  Yes Rai, Ripudeep K, MD  mometasone-formoterol (DULERA) 200-5 MCG/ACT AERO Inhale 2 puffs into the lungs 2 (two) times daily. Patient taking differently: Inhale 1 puff into the lungs 2 (two) times daily.  06/06/16  Yes Young, Tarri Fuller D, MD  Multiple Vitamin (MULTIVITAMIN WITH MINERALS) TABS tablet Take 1 tablet by mouth daily. One-A-Day   Yes [provider]  nicotine (NICODERM CQ - DOSED IN MG/24 HOURS) 14 mg/24hr patch Place 1 patch (14 mg total) onto the skin daily. Patient taking differently: Place 14 mg onto the skin daily as needed (smoking cessation).  10/04/16  Yes Reyne Dumas, MD  omeprazole (PRILOSEC) 20 MG capsule Take 1 capsule (20 mg total) by mouth daily. Patient taking differently: Take 20 mg by mouth at bedtime.  10/09/16  Yes Young, Tarri Fuller D, MD  oxyCODONE-acetaminophen (PERCOCET) 5-325 MG tablet Take 1 tablet by mouth every 4 (four) hours as needed. 10/27/16  Yes Pisciotta, Elmyra Ricks, PA-C  senna-docusate (SENOKOT-S) 8.6-50 MG tablet Start  1 tab QHS, up to 2tabs BID. Max 4 tabs daily for constipation 10/27/16  Yes Pisciotta, Elmyra Ricks, PA-C  sertraline (ZOLOFT) 50 MG tablet Take 1 tablet (50 mg total) by mouth daily. 08/30/15  Yes [name], MD  spironolactone (ALDACTONE) 50 MG tablet Take 1 tablet (50 mg total) by mouth daily. 10/04/16  Yes Reyne Dumas, MD  traMADol (ULTRAM) 50 MG tablet Take 1 tablet (50 mg total) by mouth every 6 (six) hours as needed. Patient taking differently: Take 50 mg by mouth every 6 (six) hours as needed (pain).  10/20/16  Yes Rai, Ripudeep K, MD  traZODone (DESYREL) 50 MG tablet Take 0.5-1 tablets (25-50 mg total) by mouth at bedtime as needed for sleep. Patient taking differently: Take 50 mg by mouth at bedtime.  10/04/16  Yes Reyne Dumas, MD  zolpidem (AMBIEN) 10 MG tablet Take 1 tablet (10 mg total) by mouth at bedtime as needed for sleep. Patient taking differently: Take 10 mg by mouth at bedtime.  10/10/16 11/13/16 Yes Young, Tarri Fuller D, MD  guaiFENesin (MUCINEX) 600 MG 12 hr tablet  Take 1 tablet (600 mg total) by mouth 2 (two) times daily. Patient not taking: Reported on 10/27/2016 10/20/16   Rai, Vernelle Emerald, MD  vancomycin (VANCOCIN) 50 mg/mL oral solution Take 2.5 mLs (125 mg total) by mouth 4 (four) times daily. X 14 days Patient not taking: Reported on 11/13/2016 10/20/16   Mendel Corning, MD    Family History Family History  Problem Relation Age of Onset  . Hypertension Mother   . Asthma Daughter   . Cancer Paternal Aunt   . Asthma Maternal Grandmother     Social History Social History  Substance Use Topics  . Smoking status: Former Smoker    Packs/day: 0.50    Years: 18.00    Types: Cigarettes    Quit date: 04/29/2014  . Smokeless tobacco: Never Used  . Alcohol use No     Allergies   Patient has no known allergies.   Review of Systems Review of Systems  Constitutional: Positive for fatigue.  Respiratory: Positive for cough, shortness of breath and wheezing.   Musculoskeletal:  Positive for arthralgias.  All other systems reviewed and are negative.    Physical Exam Updated Vital Signs BP (!) 143/77 (BP Location: Left Wrist)   Pulse 98   Temp 98 F (36.7 C) (Oral)   Resp 18   Ht 5\' 6"  (1.676 m)   Wt (!) 153 kg (337 lb 6.4 oz)   LMP 10/21/2016   SpO2 98%   BMI 54.46 kg/m   Physical Exam  Constitutional: She is oriented to person, place, and time. She appears well-developed and well-nourished. No distress.  HENT:  Head: Normocephalic and atraumatic.  Eyes: Conjunctivae are normal.  Neck: Neck supple.  Cardiovascular: Normal rate, regular rhythm and normal heart sounds.  Exam reveals no friction rub.   No murmur heard. Pulmonary/Chest: Tachypnea noted. She is in respiratory distress. She has decreased breath sounds. She has wheezes in the right upper field, the right middle field, the right lower field, the left upper field, the left middle field and the left lower field. She has no rales.  Abdominal: She exhibits no distension.  Musculoskeletal: She exhibits no edema.  Neurological: She is alert and oriented to person, place, and time. She exhibits normal muscle tone.  Skin: Skin is warm. Capillary refill takes less than 2 seconds.  Psychiatric: She has a normal mood and affect.  Nursing note and vitals reviewed.    ED Treatments / Results  Labs (all labs ordered are listed, but only abnormal results are displayed) Labs Reviewed  COMPREHENSIVE METABOLIC PANEL - Abnormal; Notable for the following:       Result Value   Potassium 3.0 (*)    Glucose, Bld 129 (*)    BUN <5 (*)    Calcium 8.2 (*)    Total Protein 6.1 (*)    Albumin 2.6 (*)    All other components within normal limits  CBC WITH DIFFERENTIAL/PLATELET - Abnormal; Notable for the following:    WBC 13.0 (*)    RDW 18.7 (*)    Platelets 417 (*)    Neutro Abs 8.6 (*)    All other components within normal limits  URINALYSIS, ROUTINE W REFLEX MICROSCOPIC - Abnormal; Notable for the  following:    Color, Urine AMBER (*)    APPearance HAZY (*)    Bilirubin Urine SMALL (*)    Protein, ur 30 (*)    Bacteria, UA RARE (*)    Squamous Epithelial / LPF 6-30 (*)  All other components within normal limits  CBC - Abnormal; Notable for the following:    WBC 11.8 (*)    Hemoglobin 11.3 (*)    MCH 25.9 (*)    RDW 18.6 (*)    All other components within normal limits  BASIC METABOLIC PANEL - Abnormal; Notable for the following:    Glucose, Bld 212 (*)    BUN <5 (*)    Calcium 8.2 (*)    All other components within normal limits  LACTIC ACID, PLASMA - Abnormal; Notable for the following:    Lactic Acid, Venous 5.0 (*)    All other components within normal limits  GLUCOSE, CAPILLARY - Abnormal; Notable for the following:    Glucose-Capillary 176 (*)    All other components within normal limits  LACTIC ACID, PLASMA - Abnormal; Notable for the following:    Lactic Acid, Venous 3.0 (*)    All other components within normal limits  LACTIC ACID, PLASMA - Abnormal; Notable for the following:    Lactic Acid, Venous 2.9 (*)    All other components within normal limits  GLUCOSE, CAPILLARY - Abnormal; Notable for the following:    Glucose-Capillary 196 (*)    All other components within normal limits  GLUCOSE, CAPILLARY - Abnormal; Notable for the following:    Glucose-Capillary 158 (*)    All other components within normal limits  I-STAT CG4 LACTIC ACID, ED - Abnormal; Notable for the following:    Lactic Acid, Venous 3.33 (*)    All other components within normal limits  I-STAT CG4 LACTIC ACID, ED - Abnormal; Notable for the following:    Lactic Acid, Venous 2.48 (*)    All other components within normal limits  MAGNESIUM  BRAIN NATRIURETIC PEPTIDE  I-STAT BETA HCG BLOOD, ED ([facility], [facility], [facility] ONLY)  I-STAT TROPONIN, ED    EKG  EKG Interpretation None       Radiology Dg Chest 2 View  Result Date: 11/13/2016 CLINICAL DATA:  Asthma exacerbation. EXAM: CHEST  2 VIEW  COMPARISON:  10/18/2016 FINDINGS: The cardiomediastinal silhouette is within normal limits. The lungs are well inflated. Mild peribronchial thickening is similar to the prior study. No confluent airspace opacity, overt pulmonary edema, pleural effusion, or pneumothorax is identified. No acute osseous abnormality is seen. IMPRESSION: Mild chronic peribronchial thickening which may reflect reactive airways disease. No consolidation to suggest pneumonia. Electronically Signed   By: Logan Bores M.D.   On: 11/13/2016 16:28    Procedures .Critical Care Performed by: Duffy Bruce Authorized by: Duffy Bruce     (including critical care time)  CRITICAL CARE Performed by: Evonnie Pat   Total critical care time: 35 minutes  Critical care time was exclusive of separately billable procedures and treating other patients.  Critical care was necessary to treat or prevent imminent or life-threatening deterioration.  Critical care was time spent personally by me on the following activities: development of treatment plan with patient and/or surrogate as well as nursing, discussions with consultants, evaluation of patient's response to treatment, examination of patient, obtaining history from patient or surrogate, ordering and performing treatments and interventions, ordering and review of laboratory studies, ordering and review of radiographic studies, pulse oximetry and re-evaluation of patient's condition.    Medications Ordered in ED Medications  ipratropium-albuterol (DUONEB) 0.5-2.5 (3) MG/3ML nebulizer solution 3 mL (3 mLs Nebulization Not Given 11/13/16 2103)  multivitamin with minerals tablet 1 tablet (1 tablet Oral Given 11/14/16 0932)  oxyCODONE-acetaminophen (PERCOCET/ROXICET) 5-325 MG per  tablet 1 tablet (1 tablet Oral Given 11/14/16 0932)  fluticasone (FLONASE) 50 MCG/ACT nasal spray 2 spray (not administered)  methocarbamol (ROBAXIN) tablet 500 mg (not administered)  traMADol  (ULTRAM) tablet 50 mg (not administered)  zolpidem (AMBIEN) tablet 5 mg (5 mg Oral Given 11/13/16 2230)  pantoprazole (PROTONIX) EC tablet 40 mg (40 mg Oral Given 11/14/16 0932)  nicotine (NICODERM CQ - dosed in mg/24 hours) patch 14 mg (14 mg Transdermal Patch Applied 11/13/16 2245)  spironolactone (ALDACTONE) tablet 50 mg (50 mg Oral Given 11/14/16 0932)  amLODipine (NORVASC) tablet 10 mg (10 mg Oral Given 11/14/16 0939)  loratadine (CLARITIN) tablet 10 mg (10 mg Oral Given 11/14/16 0932)  sertraline (ZOLOFT) tablet 50 mg (50 mg Oral Given 11/14/16 0932)  traZODone (DESYREL) tablet 50 mg (50 mg Oral Given 11/13/16 2230)  furosemide (LASIX) tablet 20 mg (20 mg Oral Given 11/14/16 0932)  losartan (COZAAR) tablet 100 mg (100 mg Oral Given 11/14/16 0932)  enoxaparin (LOVENOX) injection 40 mg (40 mg Subcutaneous Not Given 11/13/16 2200)  ondansetron (ZOFRAN) tablet 4 mg (not administered)    Or  ondansetron (ZOFRAN) injection 4 mg (not administered)  acetaminophen (TYLENOL) tablet 650 mg (not administered)    Or  acetaminophen (TYLENOL) suppository 650 mg (not administered)  arformoterol (BROVANA) nebulizer solution 15 mcg (15 mcg Nebulization Given 11/14/16 0857)  budesonide (PULMICORT) nebulizer solution 0.5 mg (0.5 mg Nebulization Given 11/14/16 0857)  guaiFENesin (MUCINEX) 12 hr tablet 600 mg (600 mg Oral Given 11/14/16 0932)  insulin aspart (novoLOG) injection 0-5 Units (0 Units Subcutaneous Not Given 11/13/16 2200)  insulin aspart (novoLOG) injection 0-15 Units (3 Units Subcutaneous Given 11/14/16 0858)  hydrALAZINE (APRESOLINE) injection 10 mg (not administered)  ipratropium-albuterol (DUONEB) 0.5-2.5 (3) MG/3ML nebulizer solution 3 mL (3 mLs Nebulization Given 11/14/16 0017)  alum & mag hydroxide-simeth (MAALOX/MYLANTA) 200-200-20 MG/5ML suspension 30 mL (30 mLs Oral Given 11/14/16 0725)  ipratropium-albuterol (DUONEB) 0.5-2.5 (3) MG/3ML nebulizer solution 3 mL (not administered)  methylPREDNISolone  sodium succinate (SOLU-MEDROL) 40 mg/mL injection 40 mg (not administered)  gi cocktail (Maalox,Lidocaine,Donnatal) (not administered)  albuterol (PROVENTIL) (2.5 MG/3ML) 0.083% nebulizer solution 5 mg (5 mg Nebulization Given 11/13/16 1600)  albuterol (PROVENTIL,VENTOLIN) solution continuous neb (10 mg/hr Nebulization Given 11/13/16 1751)  ipratropium (ATROVENT) nebulizer solution 1 mg (1 mg Nebulization Given 11/13/16 1751)  methylPREDNISolone sodium succinate (SOLU-MEDROL) 125 mg/2 mL injection 125 mg (125 mg Intravenous Given 11/13/16 1923)  magnesium sulfate IVPB 2 g 50 mL (0 g Intravenous Stopped 11/13/16 2100)  sodium chloride 0.9 % bolus 1,000 mL (0 mLs Intravenous Stopped 11/13/16 2100)  ondansetron (ZOFRAN) injection 4 mg (4 mg Intravenous Given 11/13/16 1923)  gi cocktail (Maalox,Lidocaine,Donnatal) (30 mLs Oral Given 11/13/16 1923)  potassium chloride SA (K-DUR,KLOR-CON) CR tablet 60 mEq (60 mEq Oral Given 11/13/16 2230)     Initial Impression / Assessment and Plan / ED Course  I have reviewed the triage vital signs and the nursing notes.  Pertinent labs & imaging results that were available during my care of the patient were reviewed by me and considered in my medical decision making (see chart for details).     46 yo F with PMHx asthma here with SOB, wheezing. On arrival, pt in significant resp distress, with diminished aeration and diffuse wheezing. Lab work shows mild lactic acidosis, likely 2/2 increased WOB and albuterol. GIven neb in triage without significant improvement. Will place on continuous neb x 1 hour, start mag/steroids given significant WOB.  Pt improving on continuous but  continues to be tachypneic, in mild distress. Will admit for further management. Additional neb given with good effect. No signs of PNA on CXR, doubt sepsis.  Final Clinical Impressions(s) / ED Diagnoses   Final diagnoses:  Moderate persistent asthma with acute exacerbation    New  Prescriptions Current Discharge Medication List       Duffy Bruce, MD 11/14/16 1234

## 2016-11-14 DIAGNOSIS — J4541 Moderate persistent asthma with (acute) exacerbation: Principal | ICD-10-CM

## 2016-11-14 LAB — CBC
HEMATOCRIT: 37.2 % (ref 36.0–46.0)
HEMOGLOBIN: 11.3 g/dL — AB (ref 12.0–15.0)
MCH: 25.9 pg — AB (ref 26.0–34.0)
MCHC: 30.4 g/dL (ref 30.0–36.0)
MCV: 85.3 fL (ref 78.0–100.0)
Platelets: 395 10*3/uL (ref 150–400)
RBC: 4.36 MIL/uL (ref 3.87–5.11)
RDW: 18.6 % — ABNORMAL HIGH (ref 11.5–15.5)
WBC: 11.8 10*3/uL — ABNORMAL HIGH (ref 4.0–10.5)

## 2016-11-14 LAB — BASIC METABOLIC PANEL
Anion gap: 9 (ref 5–15)
CHLORIDE: 104 mmol/L (ref 101–111)
CO2: 23 mmol/L (ref 22–32)
Calcium: 8.2 mg/dL — ABNORMAL LOW (ref 8.9–10.3)
Creatinine, Ser: 0.75 mg/dL (ref 0.44–1.00)
GFR calc Af Amer: >60 mL/min (ref >60)
GFR calc non Af Amer: >60 mL/min (ref >60)
GLUCOSE: 212 mg/dL — AB (ref 65–99)
POTASSIUM: 3.8 mmol/L (ref 3.5–5.1)
SODIUM: 136 mmol/L (ref 135–145)

## 2016-11-14 LAB — LACTIC ACID, PLASMA
LACTIC ACID, VENOUS: 3 mmol/L — AB (ref 0.5–1.9)
LACTIC ACID, VENOUS: 5 mmol/L — AB (ref 0.5–1.9)
Lactic Acid, Venous: 2.9 mmol/L (ref 0.5–1.9)

## 2016-11-14 LAB — GLUCOSE, CAPILLARY
GLUCOSE-CAPILLARY: 168 mg/dL — AB (ref 65–99)
GLUCOSE-CAPILLARY: 150 mg/dL — AB (ref 65–99)
Glucose-Capillary: 196 mg/dL — ABNORMAL HIGH (ref 65–99)
Glucose-Capillary: 158 mg/dL — ABNORMAL HIGH (ref 65–99)

## 2016-11-14 MED ORDER — METHYLPREDNISOLONE SODIUM SUCC 40 MG IJ SOLR
40.0000 mg | Freq: Two times a day (BID) | INTRAMUSCULAR | Status: DC
  Administered 2016-11-14 – 2016-11-15 (×2): 40 mg via INTRAVENOUS
  Filled 2016-11-14 (×2): qty 1

## 2016-11-14 MED ORDER — GI COCKTAIL ~~LOC~~
30.0000 mL | Freq: Once | ORAL | Status: AC
  Administered 2016-11-14: 30 mL via ORAL
  Filled 2016-11-14: qty 30

## 2016-11-14 MED ORDER — ENOXAPARIN SODIUM 80 MG/0.8ML ~~LOC~~ SOLN
75.0000 mg | SUBCUTANEOUS | Status: DC
  Filled 2016-11-14: qty 0.8

## 2016-11-14 MED ORDER — ALUM & MAG HYDROXIDE-SIMETH 200-200-20 MG/5ML PO SUSP
30.0000 mL | Freq: Four times a day (QID) | ORAL | Status: DC | PRN
  Administered 2016-11-14 (×3): 30 mL via ORAL
  Filled 2016-11-14 (×3): qty 30

## 2016-11-14 MED ORDER — IPRATROPIUM-ALBUTEROL 0.5-2.5 (3) MG/3ML IN SOLN
3.0000 mL | Freq: Three times a day (TID) | RESPIRATORY_TRACT | Status: DC
  Administered 2016-11-14 – 2016-11-15 (×3): 3 mL via RESPIRATORY_TRACT
  Filled 2016-11-14 (×3): qty 3

## 2016-11-14 NOTE — Progress Notes (Signed)
CRITICAL VALUE ALERT  Critical Value:  Lactic acid 3.0  Date & Time Notied:  7/19  Provider Notified: Wyline Copas  Orders Received/Actions taken: none

## 2016-11-14 NOTE — Progress Notes (Signed)
PROGRESS NOTE    April [name]  IRC:[id number] DOB: 1970/06/19 DOA: 11/13/2016 PCP: Boykin Nearing, MD    Brief Narrative:  46 y.o. female with medical history significant of moderate persistent asthma followed by Dr. Annamaria Boots ofpulmonology, asthma, morbid obesity, GERD, C.diff, OSA noncompliant with CPAP due to claustrophobia; who presents with complaints of approximately 2 days of worsening shortness of breath and cough. Patient notes that her boyfriend had recently been sick with a "cold", and she thinks that he passed it to her. Patient reports having a nonproductive cough with wheezing or she's been unable to sleep at night. Yesterday patient reports having coughing spells to the point where she vomited. Associated symptoms include decreased appetite and severe abdominal cramps. Patient reports using her albuterol inhaler up to 6 times per day, and activity seem to worsen symptoms. She also reports using her nebulizer machine 4-5 times per day with only mild relief. Patient was just recently hospitalized last month from 6/22-6/24 for similar symptoms and was also found to have C. difficile. Denies having any significant fever, chills, chest pain, loss of consciousness, diarrhea, or bleeding. Patient is followed by Dr. Annamaria Boots of pulmonology and is an appointment with him set for this Friday. Patient reports that she has not been smoking and has been using the nicotine patches.  Assessment & Plan:   Principal Problem:   Moderate persistent asthma with (acute) exacerbation Active Problems:   Morbid obesity with body mass index of 50.0-59.9 in adult [facility])   Essential hypertension   Hypokalemia   Mild obstructive sleep apnea   Tobacco abuse   Leukocytosis   Type 2 diabetes mellitus without complication, without long-term current use of insulin (HCC)  Moderate persistent asthma with exacerbation: Acute. Patient presents for shortness of breath and cough for last 2 days. Suspect possible viral  analogy to symptoms. Patient had been able to maintain O2 saturations on room air. At time of admit, patient was given 2 g of magnesium sulfate, 125 mg Solu-Medrol, 1 hour-long continuous albuterol neb, and multiple albuterol nebs without relief of symptoms. - Patient has been continued on scheduled breathing tx and IV steroids with improvement - Still with decreased air movement, however minimal wheezing - Pt currently on min O2 support - Will decrease solumedrol to q12h dosing  Lactic acidosis: Acute.Lactic acid 3.33 admission.  - Likely secondary to tissue hypoxia secondary to presenting asthma exacerbation - improving  Leukocytosis: Chronic. WBC elevated at 11.3 but appears to lower than previously seen in the past. Could be related to steroids. - afebrile  Hypokalemia: Acute. Initial potassium 3 on admission. Likely cause of patient's cramps. - stable - Repeat bmet in AM  Essential hypertension,uncontrolled: Patient's initial blood pressure is elevated to 171/94. - Continue Norvasc, Lasix, Aldactone, and losartan  - hydralazine IV continued as needed for elevated BP  Type 2 diabetes mellitus: without complication, without long-term current use of insulin: Last hemoglobin A1c on file from 05/03/2016 was 6.4. - Patient specifically requests to be on a regular diet - hold metformin - Continue with CBGs every before meals and at bedtime with Moderate SSI while on steroids.  Morbid obesity with body mass index of 50.0-59.9 in adult  - cessation done at time of admission  OSA - Noted to be noncompliant with CPAP - stable at present  Tobacco abuse: Patient reports that she is not smoke tobacco recently. - Continue nicotine patch as tolerated - Counseled strongly on tobacco cessation, continue nicotine patch  GERD - Continue  protonix - Stable at present  DVT prophylaxis: Lovenox subQ Code Status: Full Family Communication: Pt in room, family at bedside Disposition  Plan: Uncertain at this time  Consultants:     Procedures:     Antimicrobials: Anti-infectives    None      Subjective: No complaints at this time  Objective: Vitals:   11/14/16 0017 11/14/16 0457 11/14/16 1000 11/14/16 1441  BP:  (!) 156/80 (!) 143/77   Pulse: 88 96 98   Resp: 18 18 18    Temp:  97.7 F (36.5 C) 98 F (36.7 C)   TempSrc:  Oral Oral   SpO2:  96% 98% 98%  Weight:      Height:        Intake/Output Summary (Last 24 hours) at 11/14/16 1632 Last data filed at 11/14/16 1335  Gross per 24 hour  Intake             3360 ml  Output              501 ml  Net             2859 ml   Filed Weights   11/13/16 2217  Weight: (!) 153 kg (337 lb 6.4 oz)    Examination:  General exam: Appears calm and comfortable  Respiratory system: Clear to auscultation. Decreased BS, trace end-expiratory wheezing in both lung fields Cardiovascular system: S1 & S2 heard, RRR. No JVD, murmurs, rubs, gallops or clicks. No pedal edema. Gastrointestinal system: Abdomen is nondistended, soft and nontender. No organomegaly or masses felt. Normal bowel sounds heard. Central nervous system: Alert and oriented. No focal neurological deficits. Extremities: Symmetric 5 x 5 power. Skin: No rashes, lesions  Psychiatry: Judgement and insight appear normal. Mood & affect appropriate.   Data Reviewed: I have personally reviewed following labs and imaging studies  CBC:  Recent Labs Lab 11/13/16 1636 11/14/16 0512  WBC 13.0* 11.8*  NEUTROABS 8.6*  --   HGB 12.2 11.3*  HCT 38.7 37.2  MCV 84.9 85.3  PLT 417* 093   Basic Metabolic Panel:  Recent Labs Lab 11/13/16 1636 11/13/16 1926 11/14/16 0512  NA 136  --  136  K 3.0*  --  3.8  CL 103  --  104  CO2 24  --  23  GLUCOSE 129*  --  212*  BUN <5*  --  <5*  CREATININE 0.78  --  0.75  CALCIUM 8.2*  --  8.2*  MG  --  1.8  --    GFR: Estimated Creatinine Clearance: 137.1 mL/min (by C-G formula based on SCr of 0.75  mg/dL). Liver Function Tests:  Recent Labs Lab 11/13/16 1636  AST 26  ALT 14  ALKPHOS 61  BILITOT 0.7  PROT 6.1*  ALBUMIN 2.6*   No results for input(s): LIPASE, AMYLASE in the last 168 hours. No results for input(s): AMMONIA in the last 168 hours. Coagulation Profile: No results for input(s): INR, PROTIME in the last 168 hours. Cardiac Enzymes: No results for input(s): CKTOTAL, CKMB, CKMBINDEX, TROPONINI in the last 168 hours. BNP (last 3 results) No results for input(s): PROBNP in the last 8760 hours. HbA1C: No results for input(s): HGBA1C in the last 72 hours. CBG:  Recent Labs Lab 11/13/16 2214 11/14/16 0736 11/14/16 1154  GLUCAP 176* 196* 158*   Lipid Profile: No results for input(s): CHOL, HDL, LDLCALC, TRIG, CHOLHDL, LDLDIRECT in the last 72 hours. Thyroid Function Tests: No results for input(s): TSH, T4TOTAL, FREET4, T3FREE,  THYROIDAB in the last 72 hours. Anemia Panel: No results for input(s): VITAMINB12, FOLATE, FERRITIN, TIBC, IRON, RETICCTPCT in the last 72 hours. Sepsis Labs:  Recent Labs Lab 11/13/16 1942 11/13/16 2344 11/14/16 0700 11/14/16 0815  LATICACIDVEN 2.48* 5.0* 3.0* 2.9*    No results found for this or any previous visit (from the past 240 hour(s)).   Radiology Studies: Dg Chest 2 View  Result Date: 11/13/2016 CLINICAL DATA:  Asthma exacerbation. EXAM: CHEST  2 VIEW COMPARISON:  10/18/2016 FINDINGS: The cardiomediastinal silhouette is within normal limits. The lungs are well inflated. Mild peribronchial thickening is similar to the prior study. No confluent airspace opacity, overt pulmonary edema, pleural effusion, or pneumothorax is identified. No acute osseous abnormality is seen. IMPRESSION: Mild chronic peribronchial thickening which may reflect reactive airways disease. No consolidation to suggest pneumonia. Electronically Signed   By: Logan Bores M.D.   On: 11/13/2016 16:28    Scheduled Meds: . amLODipine  10 mg Oral Daily  .  arformoterol  15 mcg Nebulization BID  . budesonide (PULMICORT) nebulizer solution  0.5 mg Nebulization BID  . enoxaparin (LOVENOX) injection  75 mg Subcutaneous Q24H  . furosemide  20 mg Oral Daily  . guaiFENesin  600 mg Oral BID  . insulin aspart  0-15 Units Subcutaneous TID WC  . insulin aspart  0-5 Units Subcutaneous QHS  . ipratropium-albuterol  3 mL Nebulization Once  . ipratropium-albuterol  3 mL Nebulization TID  . loratadine  10 mg Oral Daily  . losartan  100 mg Oral Daily  . methylPREDNISolone (SOLU-MEDROL) injection  40 mg Intravenous Q12H  . multivitamin with minerals  1 tablet Oral Daily  . pantoprazole  40 mg Oral Daily  . sertraline  50 mg Oral Daily  . spironolactone  50 mg Oral Daily  . traZODone  50 mg Oral QHS  . zolpidem  5 mg Oral QHS   Continuous Infusions:   LOS: 1 day   Shirla Hodgkiss, Orpah Melter, MD [facility] Pager [phone]  If 7PM-7AM, please contact night-coverage [email or web address] Password [facility] 11/14/2016, 4:32 PM

## 2016-11-14 NOTE — Progress Notes (Signed)
CRITICAL VALUE ALERT  Critical Value: Lactic acid 2.9  Date & Time Notied:  7/19 3419   Provider Notified: Wyline Copas  Orders Received/Actions taken: none

## 2016-11-14 NOTE — Progress Notes (Signed)
ANTICOAGULATION CONSULT NOTE - Initial Consult  Pharmacy Consult for adjustment of Lovenox dosing for pt with BMI >30 Indication: VTE prophylaxis  No Known Allergies  Patient Measurements: Height: 5\' 6"  (167.6 cm) Weight: (!) 337 lb 6.4 oz (153 kg) IBW/kg (Calculated) : 59.3   Vital Signs: Temp: 98 F (36.7 C) (07/19 1000) Temp Source: Oral (07/19 1000) BP: 143/77 (07/19 1000) Pulse Rate: 98 (07/19 1000)  Labs:  Recent Labs  11/13/16 1636 11/14/16 0512  HGB 12.2 11.3*  HCT 38.7 37.2  PLT 417* 395  CREATININE 0.78 0.75    Estimated Creatinine Clearance: 137.1 mL/min (by C-G formula based on SCr of 0.75 mg/dL).   Medical History: Past Medical History:  Diagnosis Date  . Acanthosis nigricans   . Arthritis   . Asthma    Followed by Dr. Annamaria Boots (pulmonology); receives every other week omalizumab injections; has frequent exacerbations  . COPD (chronic obstructive pulmonary disease) ([place])    PFTs in 2002, FEV1/FVC 65, no post bronchodilater test done  . Depression   . GERD (gastroesophageal reflux disease)   . Headache(784.0)   . Helicobacter pylori (H. pylori) infection   . Hypertension, essential   . Insomnia   . Menorrhagia   . Morbid obesity ([place])   . Obesity   . Seasonal allergies   . Shortness of breath   . Sleep apnea    Sleep study 2008 - mild OSA, not enough events to titrate CPAP  . Tobacco user     Medications:  Prescriptions Prior to Admission  Medication Sig Dispense Refill Last Dose  . albuterol (PROAIR HFA) 108 (90 Base) MCG/ACT inhaler Inhale 2 puffs into the lungs every 6 (six) hours as needed for wheezing or shortness of breath. 1 Inhaler 12 11/13/2016 at Unknown time  . albuterol (PROVENTIL) (2.5 MG/3ML) 0.083% nebulizer solution Take 3 mLs (2.5 mg total) by nebulization every 4 (four) hours as needed for wheezing or shortness of breath. 150 mL 2 11/13/2016 at Unknown time  . amLODipine (NORVASC) 10 MG tablet Take 1 tablet (10 mg total) by  mouth daily. 30 tablet 2 11/13/2016 at Unknown time  . fluticasone (FLONASE) 50 MCG/ACT nasal spray Place 2 sprays into both nostrils daily. (Patient taking differently: Place 2 sprays into both nostrils daily as needed (congestion). ) 16 g 6 prn  . furosemide (LASIX) 20 MG tablet Take 1 tablet (20 mg total) by mouth daily. 30 tablet 1 11/13/2016 at Unknown time  . ipratropium (ATROVENT) 0.02 % nebulizer solution Take 2.5 mLs (0.5 mg total) by nebulization 4 (four) times daily as needed for wheezing or shortness of breath. 150 mL 2 11/13/2016 at Unknown time  . loratadine (CLARITIN) 10 MG tablet Take 1 tablet (10 mg total) by mouth daily. 90 tablet 3 11/12/2016 at Unknown time  . losartan (COZAAR) 100 MG tablet Take 1 tablet (100 mg total) by mouth daily. 30 tablet 3 11/13/2016 at Unknown time  . metFORMIN (GLUCOPHAGE) 500 MG tablet Take 1 tablet (500 mg total) by mouth 2 (two) times daily with a meal. 60 tablet 0 11/12/2016 at Unknown time  . methocarbamol (ROBAXIN) 500 MG tablet Take 1 tablet (500 mg total) by mouth 2 (two) times daily. (Patient taking differently: Take 500 mg by mouth 2 (two) times daily as needed for muscle spasms. ) 60 tablet 1 prn  . mometasone-formoterol (DULERA) 200-5 MCG/ACT AERO Inhale 2 puffs into the lungs 2 (two) times daily. (Patient taking differently: Inhale 1 puff into the lungs 2 (  two) times daily. ) 1 Inhaler 2 11/13/2016 at Unknown time  . Multiple Vitamin (MULTIVITAMIN WITH MINERALS) TABS tablet Take 1 tablet by mouth daily. One-A-Day   11/12/2016 at Unknown time  . nicotine (NICODERM CQ - DOSED IN MG/24 HOURS) 14 mg/24hr patch Place 1 patch (14 mg total) onto the skin daily. (Patient taking differently: Place 14 mg onto the skin daily as needed (smoking cessation). ) 28 patch 0 Past Week at Unknown time  . omeprazole (PRILOSEC) 20 MG capsule Take 1 capsule (20 mg total) by mouth daily. (Patient taking differently: Take 20 mg by mouth at bedtime. ) 30 capsule 11 11/12/2016 at  Unknown time  . oxyCODONE-acetaminophen (PERCOCET) 5-325 MG tablet Take 1 tablet by mouth every 4 (four) hours as needed. 13 tablet 0 11/12/2016 at Unknown time  . senna-docusate (SENOKOT-S) 8.6-50 MG tablet Start 1 tab QHS, up to 2tabs BID. Max 4 tabs daily for constipation 15 tablet 0 prn  . sertraline (ZOLOFT) 50 MG tablet Take 1 tablet (50 mg total) by mouth daily. 90 tablet 3 11/12/2016 at Unknown time  . spironolactone (ALDACTONE) 50 MG tablet Take 1 tablet (50 mg total) by mouth daily. 60 tablet 2 11/12/2016 at Unknown time  . traMADol (ULTRAM) 50 MG tablet Take 1 tablet (50 mg total) by mouth every 6 (six) hours as needed. (Patient taking differently: Take 50 mg by mouth every 6 (six) hours as needed (pain). ) 15 tablet 0 11/12/2016 at Unknown time  . traZODone (DESYREL) 50 MG tablet Take 0.5-1 tablets (25-50 mg total) by mouth at bedtime as needed for sleep. (Patient taking differently: Take 50 mg by mouth at bedtime. ) 30 tablet 3 11/12/2016 at Unknown time  . zolpidem (AMBIEN) 10 MG tablet Take 1 tablet (10 mg total) by mouth at bedtime as needed for sleep. (Patient taking differently: Take 10 mg by mouth at bedtime. ) 30 tablet 5 11/12/2016 at Unknown time  . guaiFENesin (MUCINEX) 600 MG 12 hr tablet Take 1 tablet (600 mg total) by mouth 2 (two) times daily. (Patient not taking: Reported on 10/27/2016) 60 tablet 0 Not Taking at Unknown time  . vancomycin (VANCOCIN) 50 mg/mL oral solution Take 2.5 mLs (125 mg total) by mouth 4 (four) times daily. X 14 days (Patient not taking: Reported on 11/13/2016) 60 mL 0 Completed Course at Unknown time    Assessment: 46 y.o morbidly obese female admitted on 11/13/16 due to shortness of breath. Orders received for  lovenox 40mg  sq q24h for VTE prophylaxis.  Pharmacy may adjust dose.  Hgb 12.2>11.3, slight drop.   PLTC 395k, wnl.  SCr 0.75, Crcl >100 ml/min.  No bleeding noted.  Not on anticoagulant PTA. BMI =54.6  Patient refused the Lovenox 40mg  dose last  night.   For patient with BMI >30, recommended VTE ppx dosing is 0.5mg /kg SQ q24h if renal function CrCl is >30 ml/min.   Goal of Therapy:  Anti-Xa level 0.3-0.6 units/ml 4 hrs after LMWH dose given (for VTE prophylaxis) Monitor platelets by anticoagulation protocol: Yes   Plan:  Adjust Lovenox dose to 75 mg SQ q24h  (0.5 mg/kg SQ q24h)  Monitor for renal function changes,pltc, and s/sx of bleeding.  Nicole Cella, RPh Clinical Pharmacist Pager: [phone] 11/14/2016,1:04 PM

## 2016-11-14 NOTE — Progress Notes (Signed)
Critical Lab Lactic Acid-5.0 MD notified.

## 2016-11-14 NOTE — Plan of Care (Signed)
Problem: Safety: Goal: Ability to remain free from injury will improve Outcome: Progressing Pt will be free from falls and injuries during this hositalization.  Problem: Pain Managment: Goal: General experience of comfort will improve Outcome: Progressing Pt's pain will be under control with pain management interventions during this hospitalization.

## 2016-11-14 NOTE — Progress Notes (Addendum)
Instructed pt and family at the bedside the importance of washing hands and gloving /gowning up for enteric precautions. Pt verbalizes understanding and state's she doesn't think its necessary for her family to follow protocol and that she should not be on precautions. Bedside nurse, charge nurse, and nurse director explained the risks for noncompliance, pt verbalizes understanding.

## 2016-11-15 ENCOUNTER — Ambulatory Visit: Payer: Medicaid Other | Admitting: Internal Medicine

## 2016-11-15 DIAGNOSIS — I10 Essential (primary) hypertension: Secondary | ICD-10-CM

## 2016-11-15 DIAGNOSIS — E876 Hypokalemia: Secondary | ICD-10-CM

## 2016-11-15 DIAGNOSIS — G4733 Obstructive sleep apnea (adult) (pediatric): Secondary | ICD-10-CM

## 2016-11-15 LAB — GLUCOSE, CAPILLARY
Glucose-Capillary: 128 mg/dL — ABNORMAL HIGH (ref 65–99)
Glucose-Capillary: 147 mg/dL — ABNORMAL HIGH (ref 65–99)

## 2016-11-15 MED ORDER — POLYETHYLENE GLYCOL 3350 17 G PO PACK
17.0000 g | PACK | Freq: Once | ORAL | Status: AC
  Administered 2016-11-15: 17 g via ORAL
  Filled 2016-11-15: qty 1

## 2016-11-15 MED ORDER — PREDNISONE 5 MG PO TABS: 5.0000 mg | ORAL_TABLET | Freq: Every day | ORAL | Status: DC

## 2016-11-15 NOTE — Hospital Discharge Follow-Up (Signed)
The patient is known to  the [facility] at [facility]. Met with her today to discuss follow up after discharge. She is agreeable to ongoing follow up at [facility]. An appointment was scheduled for 11/19/16 @ 1400 and the information was placed on the AVS.   Provided her with the contact # for Medicaid transportation assessment. Also explained that an application for SCAT can be completed when she is in the office for her appointment.   She noted that she is still waiting for a decision about her disability and is considering hiring an attorney to assist.   She said that she has a new nebulizer. She also noted that [facility] has contacted her and plan to call her again next week.   She also confirmed her contact # [phone].  Update provided to [facility], RN CM

## 2016-11-15 NOTE — Discharge Summary (Signed)
Physician Discharge Summary  April [name] WCB:[id number] DOB: [birth year] DOA: 11/13/2016  PCP: Boykin Nearing, MD  Admit date: 11/13/2016 Discharge date: 11/15/2016  Admitted From: Home Disposition:  Home  Recommendations for Outpatient Follow-up:  1. Follow up with PCP in 1-2 weeks 2. Follow up with Pulmonologist as scheduled  Discharge Condition:Improved CODE STATUS:Full Diet recommendation: Regular   Brief/Interim Summary: 46 y.o.femalewith medical history significant of moderate persistent asthma followed by Dr. Annamaria Boots ofpulmonology, asthma, morbid obesity, GERD, C.diff, OSA noncompliant with CPAP due to claustrophobia; who presents with complaints of approximately 2 days of worsening shortness of breath and cough. Patient notes that her boyfriend had recently been sick with a "cold", and she thinks that he passed it to her. Patient reports having anonproductive cough with wheezing or she's been unable to sleep at night. Yesterday patient reports having coughing spells to the point where she vomited. Associated symptoms include decreased appetite and severeabdominal cramps. Patient reports using her albuterol inhaler up to 6 times per day, and activity seem to worsen symptoms. She also reports using her nebulizer machine 4-5 times per day with only mildrelief. Patient was just recently hospitalized last month from 6/22-6/24 for similar symptomsand was also found to have C. difficile.Denies having any significant fever, chills, chest pain, loss of consciousness, diarrhea, or bleeding. Patient is followed by Dr. Annamaria Boots of pulmonology and is an appointment with him set for this Friday. Patient reports that she has not been smoking and has been using the nicotine patches.  Moderate persistent asthma with exacerbation: Acute. Patient presents for shortness of breath and cough for last 2 days. Suspect possible viral analogy to symptoms. Patient had been able to maintain O2 saturations on room  air. At time of admit, patient was given 2 g of magnesium sulfate, 125 mg Solu-Medrol, 1 hour-long continuous albuterol neb, and multiple albuterol nebs without relief of symptoms. - Patient was continued on scheduled breathing tx and IV steroids with improvement - O2 was weaned to min O2 support - Steroids were tapered to prednisone to be continued on discharge  Lacticacidosis: Acute.Lactic acid 3.33 admission.  - Likely secondary to tissue hypoxia secondary to presenting asthma exacerbation - improved  Leukocytosis:Chronic. WBC elevated at 11.3 but appears to lower than previously seen in the past. Could be related to steroids. - remained afebrile  Hypokalemia: Acute. Initial potassium 3 on admission. Likely cause of patient's cramps. - stable  Essential hypertension,uncontrolled: Patient's initial blood pressure is elevated to 171/94. - Continue Norvasc, Lasix, Aldactone, andlosartan  - hydralazine IV was continued as needed for elevated BP  Type 2 diabetes mellitus: without complication, without long-term current use of insulin: Last hemoglobin A1c on file from 05/03/2016 was 6.4. - Patient specifically requests to be on a regular diet - held metformin while admitted  Morbid obesity with body mass index of 50.0-59.9 in adult  - cessation done at time of admission  OSA - Noted to be noncompliant with CPAP - stable at present  Tobacco abuse: Patient reports that she is not smoke tobacco recently. - Continue nicotinepatch as tolerated - Counseled strongly on tobacco cessation, continue nicotine patch  GERD - Continue protonix - Stable at present  Discharge Diagnoses:  Principal Problem:   Moderate persistent asthma with (acute) exacerbation Active Problems:   Morbid obesity with body mass index of 50.0-59.9 in adult [facility])   Essential hypertension   Hypokalemia   Mild obstructive sleep apnea   Tobacco abuse   Leukocytosis   Type 2 diabetes  mellitus without  complication, without long-term current use of insulin ([place])    Discharge Instructions   Allergies as of 11/15/2016   No Known Allergies     Medication List    STOP taking these medications   vancomycin 50 mg/mL oral solution Commonly known as:  VANCOCIN     TAKE these medications   albuterol (2.5 MG/3ML) 0.083% nebulizer solution Commonly known as:  PROVENTIL Take 3 mLs (2.5 mg total) by nebulization every 4 (four) hours as needed for wheezing or shortness of breath.   albuterol 108 (90 Base) MCG/ACT inhaler Commonly known as:  PROAIR HFA Inhale 2 puffs into the lungs every 6 (six) hours as needed for wheezing or shortness of breath.   amLODipine 10 MG tablet Commonly known as:  NORVASC Take 1 tablet (10 mg total) by mouth daily.   fluticasone 50 MCG/ACT nasal spray Commonly known as:  FLONASE Place 2 sprays into both nostrils daily. What changed:  when to take this  reasons to take this   furosemide 20 MG tablet Commonly known as:  LASIX Take 1 tablet (20 mg total) by mouth daily.   guaiFENesin 600 MG 12 hr tablet Commonly known as:  MUCINEX Take 1 tablet (600 mg total) by mouth 2 (two) times daily.   ipratropium 0.02 % nebulizer solution Commonly known as:  ATROVENT Take 2.5 mLs (0.5 mg total) by nebulization 4 (four) times daily as needed for wheezing or shortness of breath.   loratadine 10 MG tablet Commonly known as:  CLARITIN Take 1 tablet (10 mg total) by mouth daily.   losartan 100 MG tablet Commonly known as:  COZAAR Take 1 tablet (100 mg total) by mouth daily.   metFORMIN 500 MG tablet Commonly known as:  GLUCOPHAGE Take 1 tablet (500 mg total) by mouth 2 (two) times daily with a meal.   methocarbamol 500 MG tablet Commonly known as:  ROBAXIN Take 1 tablet (500 mg total) by mouth 2 (two) times daily. What changed:  when to take this  reasons to take this   mometasone-formoterol 200-5 MCG/ACT Aero Commonly known as:  DULERA Inhale 2  puffs into the lungs 2 (two) times daily. What changed:  how much to take   multivitamin with minerals Tabs tablet Take 1 tablet by mouth daily. One-A-Day   nicotine 14 mg/24hr patch Commonly known as:  NICODERM CQ - dosed in mg/24 hours Place 1 patch (14 mg total) onto the skin daily. What changed:  when to take this  reasons to take this   omeprazole 20 MG capsule Commonly known as:  PRILOSEC Take 1 capsule (20 mg total) by mouth daily. What changed:  when to take this   oxyCODONE-acetaminophen 5-325 MG tablet Commonly known as:  PERCOCET Take 1 tablet by mouth every 4 (four) hours as needed.   predniSONE 5 MG tablet Commonly known as:  DELTASONE Take 1 tablet (5 mg total) by mouth daily with breakfast.   senna-docusate 8.6-50 MG tablet Commonly known as:  Senokot-S Start 1 tab QHS, up to 2tabs BID. Max 4 tabs daily for constipation   sertraline 50 MG tablet Commonly known as:  ZOLOFT Take 1 tablet (50 mg total) by mouth daily.   spironolactone 50 MG tablet Commonly known as:  ALDACTONE Take 1 tablet (50 mg total) by mouth daily.   traMADol 50 MG tablet Commonly known as:  ULTRAM Take 1 tablet (50 mg total) by mouth every 6 (six) hours as needed. What changed:  reasons  to take this   traZODone 50 MG tablet Commonly known as:  DESYREL Take 0.5-1 tablets (25-50 mg total) by mouth at bedtime as needed for sleep. What changed:  how much to take  when to take this   zolpidem 10 MG tablet Commonly known as:  AMBIEN Take 1 tablet (10 mg total) by mouth at bedtime as needed for sleep. What changed:  when to take this            Durable Medical Equipment        Start     Ordered   11/15/16 1211  For home use only DME Cane  Once     11/15/16 1210     Follow-up Information    [place]. Go on 11/19/2016.   Why:  at 2:00pm for an appointment with Dr Jarold Song in the [facility]. Contact information: [place] [address] [phone]       Deneise Lever, MD Follow up.   Specialty:  Pulmonary Disease Why:  as scheduled Contact information: [place] [address] [phone]          No Known Allergies   Procedures/Studies: Dg Chest 2 View  Result Date: 11/13/2016 CLINICAL DATA:  Asthma exacerbation. EXAM: CHEST  2 VIEW COMPARISON:  10/18/2016 FINDINGS: The cardiomediastinal silhouette is within normal limits. The lungs are well inflated. Mild peribronchial thickening is similar to the prior study. No confluent airspace opacity, overt pulmonary edema, pleural effusion, or pneumothorax is identified. No acute osseous abnormality is seen. IMPRESSION: Mild chronic peribronchial thickening which may reflect reactive airways disease. No consolidation to suggest pneumonia. Electronically Signed   By: Logan Bores M.D.   On: 11/13/2016 16:28   Dg Chest 2 View  Result Date: 10/18/2016 CLINICAL DATA:  Acute onset of shortness of breath, dizziness and nausea. Initial encounter. EXAM: CHEST  2 VIEW COMPARISON:  Chest radiograph performed 09/30/2016, and CTA of the chest performed 10/02/2016 FINDINGS: The lungs are well-aerated and clear. There is no evidence of focal opacification, pleural effusion or pneumothorax. The heart is borderline normal in size. No acute osseous abnormalities are seen. IMPRESSION: No acute cardiopulmonary process seen. Electronically Signed   By: Garald Balding M.D.   On: 10/18/2016 20:35   Ct Head Wo Contrast  Result Date: 10/20/2016 CLINICAL DATA:  Fall last week EXAM: CT HEAD WITHOUT CONTRAST TECHNIQUE: Contiguous axial images were obtained from the base of the skull through the vertex without intravenous contrast. COMPARISON:  None. FINDINGS: Brain: Ventricles are normal in size and configuration. There is no mass, hemorrhage, edema or other evidence of acute parenchymal abnormality. No extra-axial hemorrhage.  Vascular: No hyperdense vessel or unexpected calcification. Skull: Normal. Negative for fracture or focal lesion. Sinuses/Orbits: No acute finding. Other: None. IMPRESSION: Negative head CT.  No intracranial mass, hemorrhage or edema. Electronically Signed   By: Franki Cabot M.D.   On: 10/20/2016 10:11   Ct Abdomen Pelvis W Contrast  Result Date: 10/27/2016 CLINICAL DATA:  External hemorrhoids with pain and bleeding. EXAM: CT ABDOMEN AND PELVIS WITH CONTRAST TECHNIQUE: Multidetector CT imaging of the abdomen and pelvis was performed using the standard protocol following bolus administration of intravenous contrast. CONTRAST:  Due to difficulties with the patient's IV, she was scanned after 60 cc of Isovue 370. COMPARISON:  December 22, 2012 FINDINGS: Lower chest: No acute abnormality. Evaluation of the abdomen and pelvis is limited due to IV malfunction  and patient body habitus. The study has nearly the appearance of a noncontrast enhanced study Hepatobiliary: No focal liver abnormality is seen. No gallstones, gallbladder wall thickening, or biliary dilatation. Pancreas: Unremarkable. No pancreatic ductal dilatation or surrounding inflammatory changes. Spleen: Normal in size without focal abnormality. Adrenals/Urinary Tract: Adrenal glands are unremarkable. Kidneys are normal, without renal calculi, focal lesion, or hydronephrosis. Bladder is unremarkable. Stomach/Bowel: The stomach and small bowel are normal. Colonic diverticuli are identified, particularly in the sigmoid colon. There appears to be mild stranding adjacent to the sigmoid colon on series 4, image 63 suggesting possible mild diverticulitis. The remainder of the colon is normal. The appendix is best seen on coronal images and is normal with no appendicitis. Vascular/Lymphatic: No significant vascular findings are present. No enlarged abdominal or pelvic lymph nodes. Reproductive: Uterus and bilateral adnexa are unremarkable. Other: No free air or  free fluid. A fat containing ventral hernias identified. The patient's known external hemorrhoid will be better assessed with physical exam. A rounded structure adjacent the anus on CT imaging likely represents 1 of the hemorrhoids Musculoskeletal: No acute or significant osseous findings. IMPRESSION: 1. The study is limited due to malfunction of the IV resulting in nearly a noncontrast study with only minimal contrast seen in the kidneys. 2. Diverticuli identified, primarily in the sigmoid colon. Mild adjacent stranding suggests low grade diverticulitis. Recommend clinical correlation. 3. The patient's known external hemorrhoids would be better assessed with physical exam. A rounded structure adjacent the anus is consistent with 1 of the hemorrhoids. 4. No other abnormalities. Electronically Signed   By: Dorise Bullion III M.D   On: 10/27/2016 18:27     Subjective: Without complaints. Denies sob. Ambulating in room on RA  Discharge Exam: Vitals:   11/15/16 0948 11/15/16 1007  BP: (!) 170/94   Pulse:  96  Resp:  20  Temp:  98.6 F (37 C)   Vitals:   11/15/16 0829 11/15/16 0830 11/15/16 0948 11/15/16 1007  BP:   (!) 170/94   Pulse:    96  Resp:    20  Temp:    98.6 F (37 C)  TempSrc:    Oral  SpO2: 98% 98%  99%  Weight:      Height:        General: Pt is alert, awake, not in acute distress Cardiovascular: RRR, S1/S2 +, no rubs, no gallops Respiratory: CTA bilaterally, no wheezing, no rhonchi Abdominal: Soft, NT, ND, bowel sounds + Extremities: no edema, no cyanosis   The results of significant diagnostics from this hospitalization (including imaging, microbiology, ancillary and laboratory) are listed below for reference.     Microbiology: No results found for this or any previous visit (from the past 240 hour(s)).   Labs: BNP (last 3 results)  Recent Labs  02/28/16 0949 06/21/16 1136 11/13/16 1926  BNP 21.4 37.3 30.1   Basic Metabolic Panel:  Recent Labs Lab  11/13/16 1636 11/13/16 1926 11/14/16 0512  NA 136  --  136  K 3.0*  --  3.8  CL 103  --  104  CO2 24  --  23  GLUCOSE 129*  --  212*  BUN <5*  --  <5*  CREATININE 0.78  --  0.75  CALCIUM 8.2*  --  8.2*  MG  --  1.8  --    Liver Function Tests:  Recent Labs Lab 11/13/16 1636  AST 26  ALT 14  ALKPHOS 61  BILITOT 0.7  PROT 6.1*  ALBUMIN 2.6*   No results for input(s): LIPASE, AMYLASE in the last 168 hours. No results for input(s): AMMONIA in the last 168 hours. CBC:  Recent Labs Lab 11/13/16 1636 11/14/16 0512  WBC 13.0* 11.8*  NEUTROABS 8.6*  --   HGB 12.2 11.3*  HCT 38.7 37.2  MCV 84.9 85.3  PLT 417* 395   Cardiac Enzymes: No results for input(s): CKTOTAL, CKMB, CKMBINDEX, TROPONINI in the last 168 hours. BNP: Invalid input(s): POCBNP CBG:  Recent Labs Lab 11/14/16 1154 11/14/16 1645 11/14/16 2133 11/15/16 0744 11/15/16 1218  GLUCAP 158* 168* 150* 128* 147*   D-Dimer No results for input(s): DDIMER in the last 72 hours. Hgb A1c No results for input(s): HGBA1C in the last 72 hours. Lipid Profile No results for input(s): CHOL, HDL, LDLCALC, TRIG, CHOLHDL, LDLDIRECT in the last 72 hours. Thyroid function studies No results for input(s): TSH, T4TOTAL, T3FREE, THYROIDAB in the last 72 hours.  Invalid input(s): FREET3 Anemia work up No results for input(s): VITAMINB12, FOLATE, FERRITIN, TIBC, IRON, RETICCTPCT in the last 72 hours. Urinalysis    Component Value Date/Time   COLORURINE AMBER (A) 11/13/2016 1830   APPEARANCEUR HAZY (A) 11/13/2016 1830   LABSPEC 1.023 11/13/2016 1830   PHURINE 5.0 11/13/2016 1830   GLUCOSEU NEGATIVE 11/13/2016 1830   GLUCOSEU NEG mg/dL 10/28/2007 2049   HGBUR NEGATIVE 11/13/2016 1830   BILIRUBINUR SMALL (A) 11/13/2016 1830   KETONESUR NEGATIVE 11/13/2016 1830   PROTEINUR 30 (A) 11/13/2016 1830   UROBILINOGEN 1.0 11/21/2014 0707   NITRITE NEGATIVE 11/13/2016 1830   LEUKOCYTESUR NEGATIVE 11/13/2016 1830   Sepsis  Labs Invalid input(s): PROCALCITONIN,  WBC,  LACTICIDVEN Microbiology No results found for this or any previous visit (from the past 240 hour(s)).   SIGNED:   Donne Hazel, MD  [facility] 11/15/2016, 1:00 PM  If 7PM-7AM, please contact night-coverage [email or web address] Password [email or web address]

## 2016-11-15 NOTE — Progress Notes (Signed)
[name] to be D/C'd Home per MD order.  Discussed prescriptions and follow up appointments with the patient. Prescriptions given to patient, medication list explained in detail. Pt verbalized understanding.  Allergies as of 11/15/2016   No Known Allergies     Medication List    STOP taking these medications   vancomycin 50 mg/mL oral solution Commonly known as:  VANCOCIN     TAKE these medications   albuterol (2.5 MG/3ML) 0.083% nebulizer solution Commonly known as:  PROVENTIL Take 3 mLs (2.5 mg total) by nebulization every 4 (four) hours as needed for wheezing or shortness of breath.   albuterol 108 (90 Base) MCG/ACT inhaler Commonly known as:  PROAIR HFA Inhale 2 puffs into the lungs every 6 (six) hours as needed for wheezing or shortness of breath.   amLODipine 10 MG tablet Commonly known as:  NORVASC Take 1 tablet (10 mg total) by mouth daily.   fluticasone 50 MCG/ACT nasal spray Commonly known as:  FLONASE Place 2 sprays into both nostrils daily. What changed:  when to take this  reasons to take this   furosemide 20 MG tablet Commonly known as:  LASIX Take 1 tablet (20 mg total) by mouth daily.   guaiFENesin 600 MG 12 hr tablet Commonly known as:  MUCINEX Take 1 tablet (600 mg total) by mouth 2 (two) times daily.   ipratropium 0.02 % nebulizer solution Commonly known as:  ATROVENT Take 2.5 mLs (0.5 mg total) by nebulization 4 (four) times daily as needed for wheezing or shortness of breath.   loratadine 10 MG tablet Commonly known as:  CLARITIN Take 1 tablet (10 mg total) by mouth daily.   losartan 100 MG tablet Commonly known as:  COZAAR Take 1 tablet (100 mg total) by mouth daily.   metFORMIN 500 MG tablet Commonly known as:  GLUCOPHAGE Take 1 tablet (500 mg total) by mouth 2 (two) times daily with a meal.   methocarbamol 500 MG tablet Commonly known as:  ROBAXIN Take 1 tablet (500 mg total) by mouth 2 (two) times daily. What changed:  when  to take this  reasons to take this   mometasone-formoterol 200-5 MCG/ACT Aero Commonly known as:  DULERA Inhale 2 puffs into the lungs 2 (two) times daily. What changed:  how much to take   multivitamin with minerals Tabs tablet Take 1 tablet by mouth daily. One-A-Day   nicotine 14 mg/24hr patch Commonly known as:  NICODERM CQ - dosed in mg/24 hours Place 1 patch (14 mg total) onto the skin daily. What changed:  when to take this  reasons to take this   omeprazole 20 MG capsule Commonly known as:  PRILOSEC Take 1 capsule (20 mg total) by mouth daily. What changed:  when to take this   oxyCODONE-acetaminophen 5-325 MG tablet Commonly known as:  PERCOCET Take 1 tablet by mouth every 4 (four) hours as needed.   predniSONE 5 MG tablet Commonly known as:  DELTASONE Take 1 tablet (5 mg total) by mouth daily with breakfast.   senna-docusate 8.6-50 MG tablet Commonly known as:  Senokot-S Start 1 tab QHS, up to 2tabs BID. Max 4 tabs daily for constipation   sertraline 50 MG tablet Commonly known as:  ZOLOFT Take 1 tablet (50 mg total) by mouth daily.   spironolactone 50 MG tablet Commonly known as:  ALDACTONE Take 1 tablet (50 mg total) by mouth daily.   traMADol 50 MG tablet Commonly known as:  ULTRAM Take 1 tablet (  50 mg total) by mouth every 6 (six) hours as needed. What changed:  reasons to take this   traZODone 50 MG tablet Commonly known as:  DESYREL Take 0.5-1 tablets (25-50 mg total) by mouth at bedtime as needed for sleep. What changed:  how much to take  when to take this   zolpidem 10 MG tablet Commonly known as:  AMBIEN Take 1 tablet (10 mg total) by mouth at bedtime as needed for sleep. What changed:  when to take this            Durable Medical Equipment        Start     Ordered   11/15/16 1211  For home use only DME Cane  Once     11/15/16 1210      Vitals:   11/15/16 0948 11/15/16 1007  BP: (!) 170/94   Pulse:  96  Resp:  20   Temp:  98.6 F (37 C)    Skin clean, dry and intact without evidence of skin break down, no evidence of skin tears noted. IV catheter discontinued intact. Site without signs and symptoms of complications. Dressing and pressure applied. Pt denies pain at this time. No complaints noted.  An After Visit Summary was printed and given to the patient. Patient escorted via [place], and D/C home via private auto.  Dixie Dials RN, BSN

## 2016-11-15 NOTE — Care Management Note (Signed)
Case Management Note  Patient Details  Name: April [name] MRN: [id number] Date of Birth: Dec 23, 1970  Subjective/Objective:          CM following for progression and d/c planning.          Action/Plan: 11/15/2016 Pt requesting a cane, ordered from [facility] for delivery to room. No other needs identified. Pt has a followup appointment for Tuesday, November 19, 2016 @ [facility] [place].   Expected Discharge Date:      11/15/2016            Expected Discharge Plan:  Home/Self Care  In-House Referral:  NA  Discharge planning Services  CM Consult  Post Acute Care Choice:  Durable Medical Equipment Choice offered to:  Patient  DME Arranged:  Kasandra Knudsen DME Agency:  [place] Arranged:  NA HH Agency:  NA  Status of Service:  Completed, signed off  If discussed at [place] of Stay Meetings, dates discussed:    Additional Comments:  Adron Bene, RN 11/15/2016, 12:11 PM

## 2016-11-18 ENCOUNTER — Telehealth: Payer: Self-pay

## 2016-11-18 ENCOUNTER — Other Ambulatory Visit: Payer: Self-pay | Admitting: Family Medicine

## 2016-11-18 ENCOUNTER — Other Ambulatory Visit: Payer: Self-pay | Admitting: Internal Medicine

## 2016-11-18 DIAGNOSIS — F411 Generalized anxiety disorder: Secondary | ICD-10-CM

## 2016-11-18 NOTE — Telephone Encounter (Signed)
[facility] Post-discharge Follow-Up Phone Call:  Date of Discharge: 11/15/2016 Principal Discharge Diagnosis(es):  Asthma exacerbation, HTN, DM Post-discharge Communication: (Clearly document all attempts clearly and date contact made) call placed to # [phone] and a HIPAA compliant voicemail message was left requesting a call back to # [phone].  Call Completed: No

## 2016-11-19 ENCOUNTER — Inpatient Hospital Stay: Payer: Medicaid Other | Admitting: Family Medicine

## 2016-11-20 ENCOUNTER — Telehealth: Payer: Self-pay

## 2016-11-20 NOTE — Telephone Encounter (Signed)
[facility] Post-discharge Follow-Up Phone Call:  Date of Discharge: 11/15/16 Principal Discharge Diagnosis(es):  Asthma exacerbation,HTN, DM- type 2. Post-discharge Communication: (Clearly document all attempts clearly and date contact made) call placed to # [phone] and a HIPAA compliant voicemail message was let requesting a call back to # [phone]. This was the phone #  That the patient provided to this CM when she was in the hospital  Call Completed: No  The patient was a no-show for her TCC appointment yesterday and needs to have the appointment re-scheduled.

## 2016-11-22 ENCOUNTER — Telehealth: Payer: Self-pay

## 2016-11-22 NOTE — Telephone Encounter (Signed)
[facility] Post-discharge Follow-Up Phone Call:  Date of Discharge: 11/15/2016 Principal Discharge Diagnosis(es): asthma exacerbation, HTN, lactic acidosis Post-discharge Communication: (Clearly document all attempts clearly and date contact made) call placed to # [phone] (H) and a HIPAA compliant voicemail message was left requesting a call back to # [phone] Call Completed: No

## 2016-11-25 IMAGING — DX DG CHEST 2V
2 series · 2 of 2 positions shown · non-contrast
Comparison: 10/24/2015

CLINICAL DATA: Shortness of breath and wheezing for 2 days, asthma,
COPD, former smoker

EXAM:
CHEST  2 VIEW

[w chest pa]
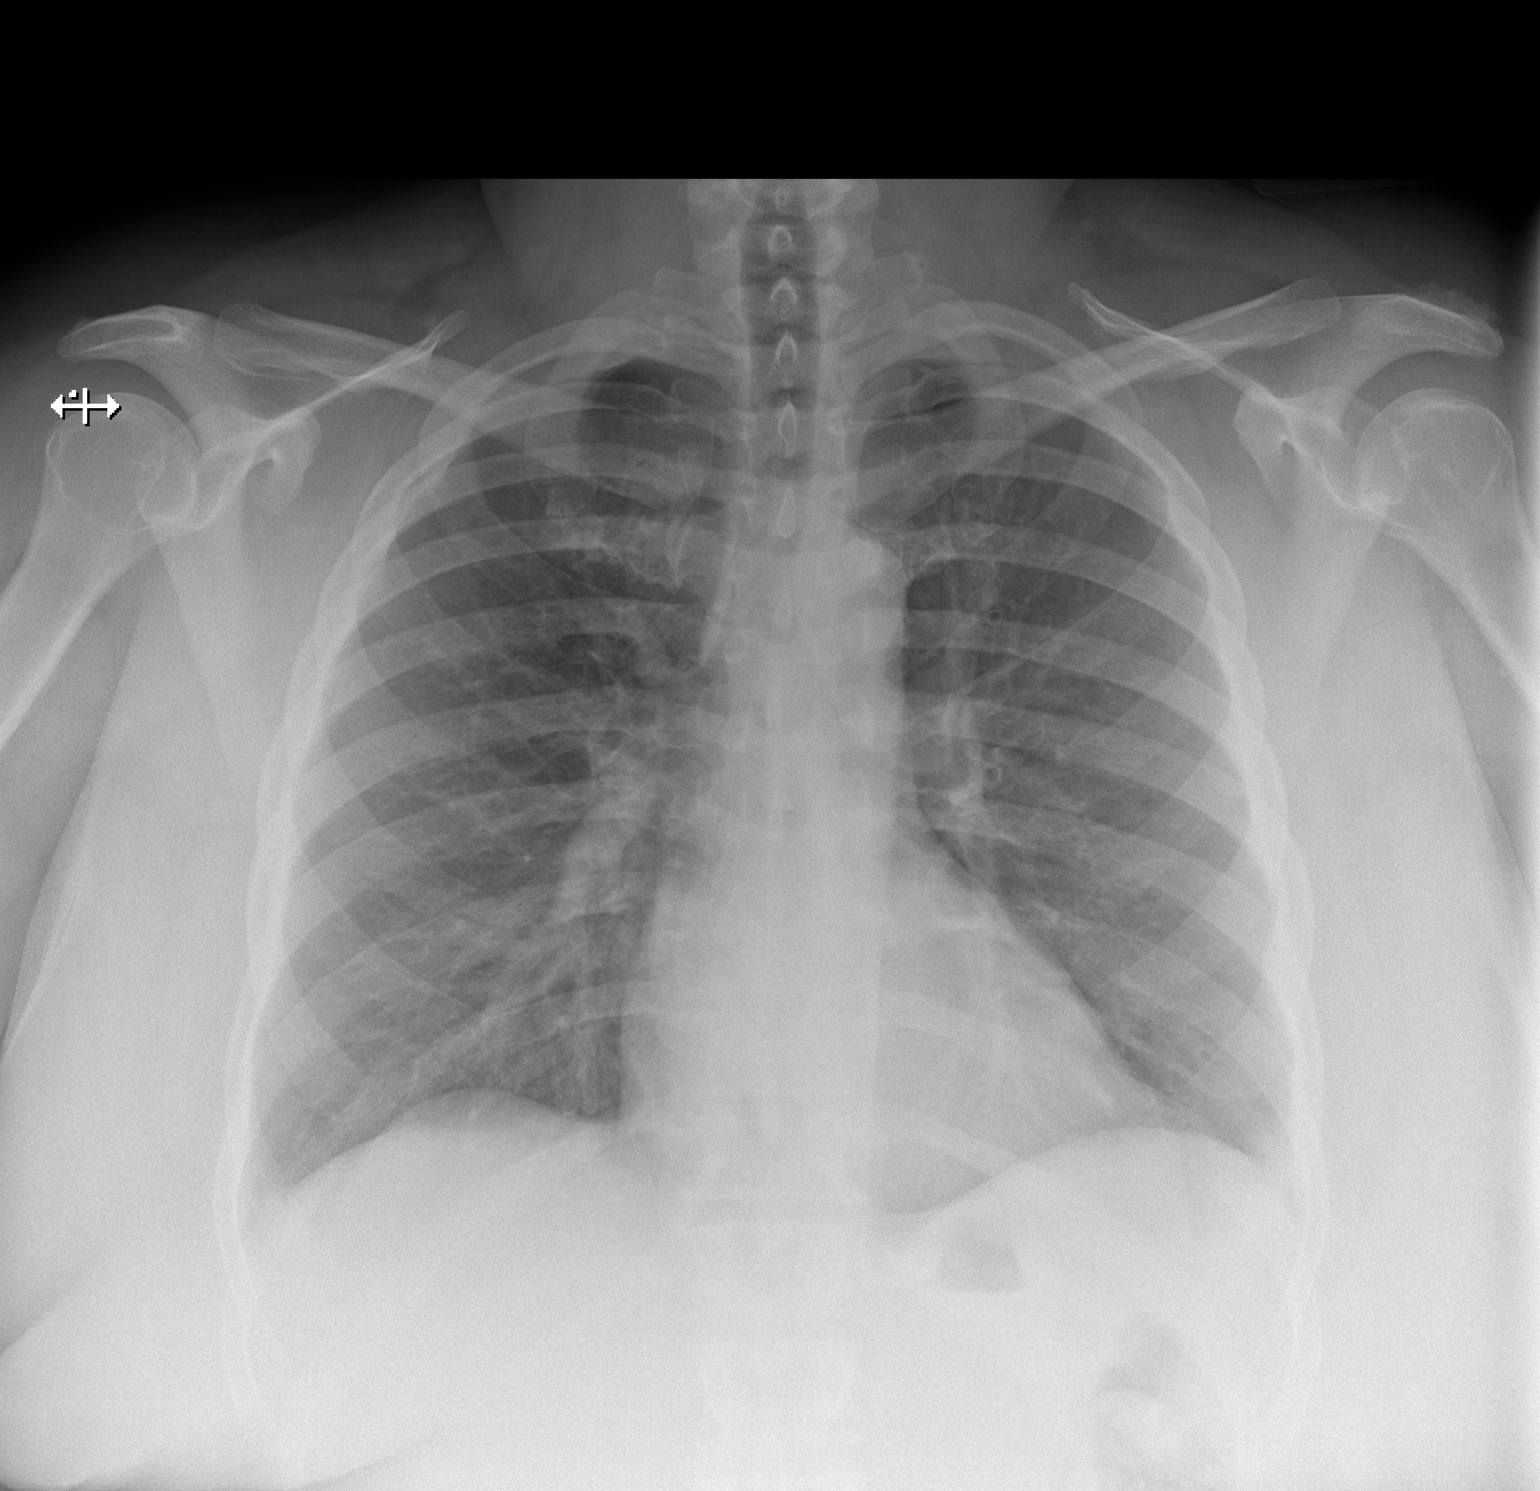

[w chest lat]
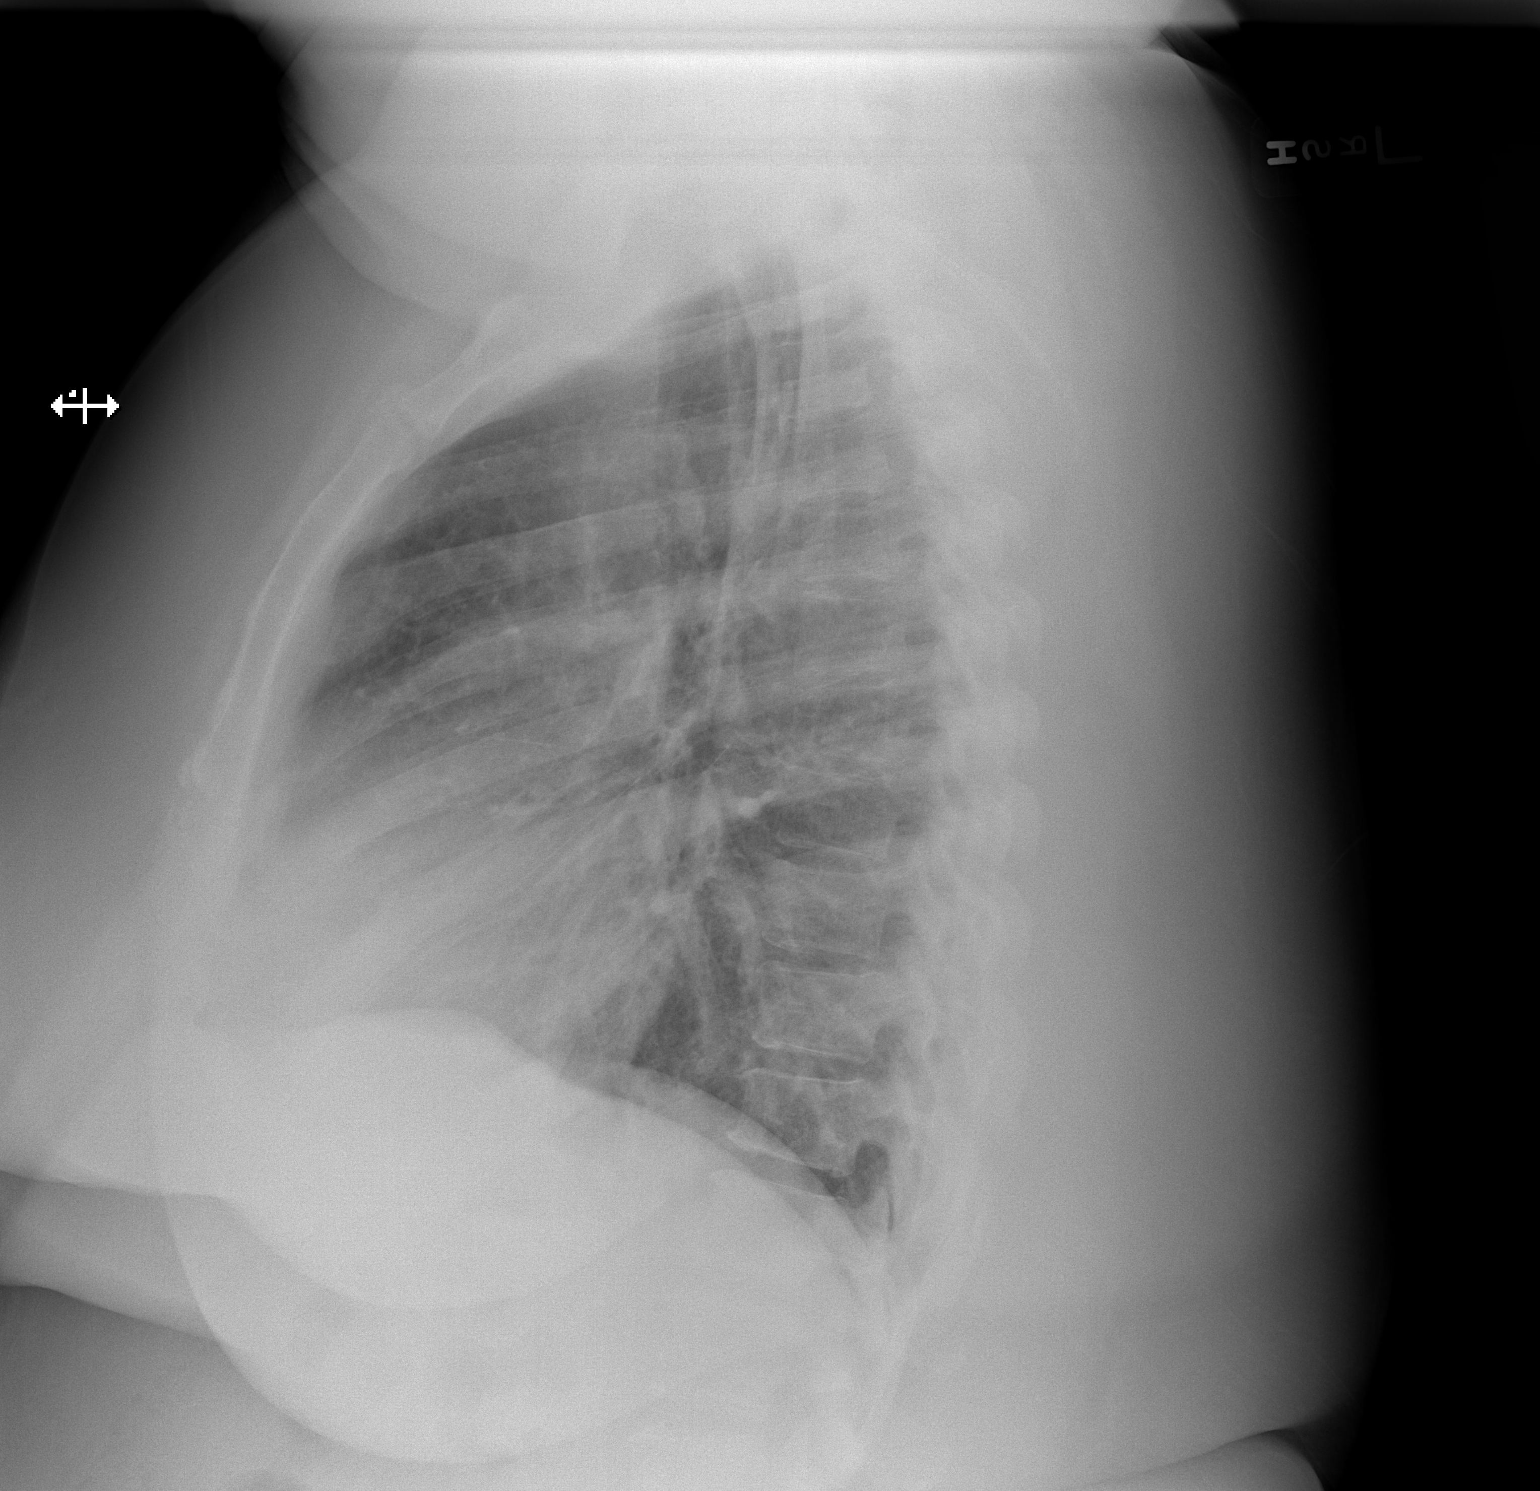

[2 of 2 positions shown; findings below may reference images not displayed]

FINDINGS: Normal heart size, mediastinal contours, and pulmonary vascularity.

Mild chronic bronchitic changes.

Lungs otherwise clear.

No pleural effusion or pneumothorax.

No acute bony abnormalities.
IMPRESSION: Chronic bronchitic changes without infiltrate.

## 2016-11-27 ENCOUNTER — Telehealth: Payer: Self-pay | Admitting: Family Medicine

## 2016-11-27 NOTE — Telephone Encounter (Signed)
Call placed to [phone] and left message asking patient to return my call in order to help her schedule an appt in the TCC clinic.

## 2016-11-29 ENCOUNTER — Telehealth: Payer: Self-pay

## 2016-11-29 NOTE — Telephone Encounter (Signed)
Attempted to contact [facility] to inquire if they have been able to meet with the patient after her hospital discharge. This CM has not been able to contact the patient as she has not returned calls and she is due for a follow up appointment.  Voicemail message left for Rosalyn Charters, [facility] requesting a call back to # [phone]

## 2016-12-03 ENCOUNTER — Telehealth: Payer: Self-pay | Admitting: Family Medicine

## 2016-12-03 NOTE — Telephone Encounter (Signed)
Call placed to [phone] [facility]) and a message was left for Rosalyn Charters in regards to patient. Asked Anderson Malta to return my call [phone] to discuss further details.

## 2016-12-06 DIAGNOSIS — E8881 Metabolic syndrome: Secondary | ICD-10-CM | POA: Insufficient documentation

## 2016-12-10 ENCOUNTER — Inpatient Hospital Stay: Payer: Medicaid Other | Admitting: Family Medicine

## 2016-12-10 ENCOUNTER — Inpatient Hospital Stay (HOSPITAL_COMMUNITY)
Admission: EM | Admit: 2016-12-10 | Discharge: 2016-12-13 | DRG: 194 | Disposition: A | Discharge status: Home / Self Care | Payer: Medicaid Other | Attending: Internal Medicine | Admitting: Internal Medicine

## 2016-12-10 ENCOUNTER — Emergency Department (HOSPITAL_COMMUNITY): Payer: Medicaid Other

## 2016-12-10 ENCOUNTER — Encounter (HOSPITAL_COMMUNITY): Payer: Self-pay | Admitting: Emergency Medicine

## 2016-12-10 DIAGNOSIS — I5032 Chronic diastolic (congestive) heart failure: Secondary | ICD-10-CM | POA: Diagnosis present

## 2016-12-10 DIAGNOSIS — G8929 Other chronic pain: Secondary | ICD-10-CM | POA: Diagnosis not present

## 2016-12-10 DIAGNOSIS — M5090 Cervical disc disorder, unspecified, unspecified cervical region: Secondary | ICD-10-CM | POA: Diagnosis not present

## 2016-12-10 DIAGNOSIS — J440 Chronic obstructive pulmonary disease with acute lower respiratory infection: Secondary | ICD-10-CM | POA: Diagnosis present

## 2016-12-10 DIAGNOSIS — J302 Other seasonal allergic rhinitis: Secondary | ICD-10-CM | POA: Diagnosis present

## 2016-12-10 DIAGNOSIS — M25562 Pain in left knee: Secondary | ICD-10-CM | POA: Diagnosis not present

## 2016-12-10 DIAGNOSIS — J4551 Severe persistent asthma with (acute) exacerbation: Secondary | ICD-10-CM | POA: Diagnosis not present

## 2016-12-10 DIAGNOSIS — E119 Type 2 diabetes mellitus without complications: Secondary | ICD-10-CM

## 2016-12-10 DIAGNOSIS — Z7952 Long term (current) use of systemic steroids: Secondary | ICD-10-CM

## 2016-12-10 DIAGNOSIS — Z79899 Other long term (current) drug therapy: Secondary | ICD-10-CM

## 2016-12-10 DIAGNOSIS — F330 Major depressive disorder, recurrent, mild: Secondary | ICD-10-CM

## 2016-12-10 DIAGNOSIS — J189 Pneumonia, unspecified organism: Secondary | ICD-10-CM | POA: Diagnosis not present

## 2016-12-10 DIAGNOSIS — I110 Hypertensive heart disease with heart failure: Secondary | ICD-10-CM | POA: Diagnosis present

## 2016-12-10 DIAGNOSIS — J441 Chronic obstructive pulmonary disease with (acute) exacerbation: Secondary | ICD-10-CM

## 2016-12-10 DIAGNOSIS — Z7984 Long term (current) use of oral hypoglycemic drugs: Secondary | ICD-10-CM

## 2016-12-10 DIAGNOSIS — E876 Hypokalemia: Secondary | ICD-10-CM | POA: Diagnosis present

## 2016-12-10 DIAGNOSIS — J45901 Unspecified asthma with (acute) exacerbation: Secondary | ICD-10-CM | POA: Diagnosis not present

## 2016-12-10 DIAGNOSIS — I10 Essential (primary) hypertension: Secondary | ICD-10-CM

## 2016-12-10 DIAGNOSIS — J4541 Moderate persistent asthma with (acute) exacerbation: Secondary | ICD-10-CM | POA: Diagnosis present

## 2016-12-10 DIAGNOSIS — J449 Chronic obstructive pulmonary disease, unspecified: Secondary | ICD-10-CM | POA: Diagnosis not present

## 2016-12-10 DIAGNOSIS — Z6841 Body Mass Index (BMI) 40.0 and over, adult: Secondary | ICD-10-CM | POA: Diagnosis not present

## 2016-12-10 DIAGNOSIS — Z8249 Family history of ischemic heart disease and other diseases of the circulatory system: Secondary | ICD-10-CM | POA: Diagnosis not present

## 2016-12-10 DIAGNOSIS — F329 Major depressive disorder, single episode, unspecified: Secondary | ICD-10-CM | POA: Diagnosis not present

## 2016-12-10 DIAGNOSIS — Z79891 Long term (current) use of opiate analgesic: Secondary | ICD-10-CM | POA: Diagnosis not present

## 2016-12-10 DIAGNOSIS — K648 Other hemorrhoids: Secondary | ICD-10-CM | POA: Diagnosis not present

## 2016-12-10 DIAGNOSIS — K219 Gastro-esophageal reflux disease without esophagitis: Secondary | ICD-10-CM | POA: Diagnosis present

## 2016-12-10 DIAGNOSIS — Z9851 Tubal ligation status: Secondary | ICD-10-CM | POA: Diagnosis not present

## 2016-12-10 DIAGNOSIS — F17210 Nicotine dependence, cigarettes, uncomplicated: Secondary | ICD-10-CM | POA: Diagnosis not present

## 2016-12-10 DIAGNOSIS — J181 Lobar pneumonia, unspecified organism: Secondary | ICD-10-CM | POA: Diagnosis not present

## 2016-12-10 DIAGNOSIS — Z87891 Personal history of nicotine dependence: Secondary | ICD-10-CM

## 2016-12-10 DIAGNOSIS — M25561 Pain in right knee: Secondary | ICD-10-CM | POA: Diagnosis not present

## 2016-12-10 DIAGNOSIS — K649 Unspecified hemorrhoids: Secondary | ICD-10-CM | POA: Diagnosis not present

## 2016-12-10 DIAGNOSIS — Z9889 Other specified postprocedural states: Secondary | ICD-10-CM | POA: Diagnosis not present

## 2016-12-10 LAB — CBC WITH DIFFERENTIAL/PLATELET
Basophils Absolute: 0 10*3/uL (ref 0.0–0.1)
Basophils Relative: 0 %
Eosinophils Absolute: 0.1 10*3/uL (ref 0.0–0.7)
Eosinophils Relative: 1 %
HCT: 38.3 % (ref 36.0–46.0)
Hemoglobin: 11.7 g/dL — ABNORMAL LOW (ref 12.0–15.0)
Lymphocytes Relative: 21 %
Lymphs Abs: 3.1 10*3/uL (ref 0.7–4.0)
MCH: 25.9 pg — ABNORMAL LOW (ref 26.0–34.0)
MCHC: 30.5 g/dL (ref 30.0–36.0)
MCV: 84.7 fL (ref 78.0–100.0)
Monocytes Absolute: 1.6 10*3/uL — ABNORMAL HIGH (ref 0.1–1.0)
Monocytes Relative: 11 %
Neutro Abs: 9.9 10*3/uL — ABNORMAL HIGH (ref 1.7–7.7)
Neutrophils Relative %: 67 %
Platelets: 318 10*3/uL (ref 150–400)
RBC: 4.52 MIL/uL (ref 3.87–5.11)
RDW: 19.4 % — ABNORMAL HIGH (ref 11.5–15.5)
WBC: 14.8 10*3/uL — ABNORMAL HIGH (ref 4.0–10.5)

## 2016-12-10 LAB — BASIC METABOLIC PANEL
Anion gap: 8 (ref 5–15)
BUN: 6 mg/dL (ref 6–20)
CO2: 27 mmol/L (ref 22–32)
Calcium: 8.1 mg/dL — ABNORMAL LOW (ref 8.9–10.3)
Chloride: 105 mmol/L (ref 101–111)
Creatinine, Ser: 0.82 mg/dL (ref 0.44–1.00)
GFR calc Af Amer: >60 mL/min (ref >60)
GFR calc non Af Amer: >60 mL/min (ref >60)
Glucose, Bld: 126 mg/dL — ABNORMAL HIGH (ref 65–99)
Potassium: 2.9 mmol/L — ABNORMAL LOW (ref 3.5–5.1)
Sodium: 140 mmol/L (ref 135–145)

## 2016-12-10 MED ORDER — ALBUTEROL SULFATE (2.5 MG/3ML) 0.083% IN NEBU: 2.5000 mg | INHALATION_SOLUTION | RESPIRATORY_TRACT | Status: DC | PRN

## 2016-12-10 MED ORDER — AMOXICILLIN-POT CLAVULANATE 875-125 MG PO TABS
1.0000 | ORAL_TABLET | Freq: Two times a day (BID) | ORAL | Status: DC
  Administered 2016-12-11 – 2016-12-13 (×6): 1 via ORAL
  Filled 2016-12-10 (×6): qty 1

## 2016-12-10 MED ORDER — LOSARTAN POTASSIUM 50 MG PO TABS
100.0000 mg | ORAL_TABLET | Freq: Every day | ORAL | Status: DC
  Administered 2016-12-11 – 2016-12-12 (×3): 100 mg via ORAL
  Filled 2016-12-10 (×4): qty 2

## 2016-12-10 MED ORDER — METHYLPREDNISOLONE SODIUM SUCC 125 MG IJ SOLR
60.0000 mg | Freq: Four times a day (QID) | INTRAMUSCULAR | Status: DC
  Administered 2016-12-11 – 2016-12-13 (×10): 60 mg via INTRAVENOUS
  Filled 2016-12-10 (×11): qty 2

## 2016-12-10 MED ORDER — METHOCARBAMOL 500 MG PO TABS
500.0000 mg | ORAL_TABLET | Freq: Two times a day (BID) | ORAL | Status: DC | PRN
  Administered 2016-12-12 (×2): 500 mg via ORAL
  Filled 2016-12-10 (×2): qty 1

## 2016-12-10 MED ORDER — SPIRONOLACTONE 50 MG PO TABS
50.0000 mg | ORAL_TABLET | Freq: Every day | ORAL | Status: DC
  Administered 2016-12-11 – 2016-12-13 (×4): 50 mg via ORAL
  Filled 2016-12-10 (×5): qty 1

## 2016-12-10 MED ORDER — ALBUTEROL SULFATE (2.5 MG/3ML) 0.083% IN NEBU: 2.5000 mg | INHALATION_SOLUTION | Freq: Four times a day (QID) | RESPIRATORY_TRACT | Status: DC | PRN

## 2016-12-10 MED ORDER — FLUTICASONE PROPIONATE 50 MCG/ACT NA SUSP
2.0000 | Freq: Every day | NASAL | Status: DC
  Administered 2016-12-11 – 2016-12-12 (×2): 2 via NASAL
  Filled 2016-12-10: qty 16

## 2016-12-10 MED ORDER — NICOTINE 14 MG/24HR TD PT24
14.0000 mg | MEDICATED_PATCH | Freq: Every day | TRANSDERMAL | Status: DC | PRN
  Administered 2016-12-11 – 2016-12-12 (×2): 14 mg via TRANSDERMAL
  Filled 2016-12-10 (×3): qty 1

## 2016-12-10 MED ORDER — OXYCODONE-ACETAMINOPHEN 5-325 MG PO TABS
1.0000 | ORAL_TABLET | ORAL | Status: DC | PRN
  Administered 2016-12-11 – 2016-12-13 (×6): 1 via ORAL
  Filled 2016-12-10 (×6): qty 1

## 2016-12-10 MED ORDER — FLORANEX PO PACK
1.0000 g | PACK | Freq: Three times a day (TID) | ORAL | Status: DC
  Administered 2016-12-11 – 2016-12-12 (×2): 1 g via ORAL
  Filled 2016-12-10 (×9): qty 1

## 2016-12-10 MED ORDER — AMLODIPINE BESYLATE 10 MG PO TABS
10.0000 mg | ORAL_TABLET | Freq: Every day | ORAL | Status: DC
  Administered 2016-12-11 – 2016-12-13 (×4): 10 mg via ORAL
  Filled 2016-12-10 (×5): qty 1

## 2016-12-10 MED ORDER — MOMETASONE FURO-FORMOTEROL FUM 200-5 MCG/ACT IN AERO
2.0000 | INHALATION_SPRAY | Freq: Two times a day (BID) | RESPIRATORY_TRACT | Status: DC
  Administered 2016-12-11 – 2016-12-12 (×3): 2 via RESPIRATORY_TRACT
  Filled 2016-12-10 (×2): qty 8.8

## 2016-12-10 MED ORDER — AMOXICILLIN-POT CLAVULANATE 875-125 MG PO TABS: 1.0000 | ORAL_TABLET | Freq: Two times a day (BID) | ORAL | Status: DC

## 2016-12-10 MED ORDER — MAGNESIUM SULFATE 2 GM/50ML IV SOLN
2.0000 g | Freq: Once | INTRAVENOUS | Status: AC
  Administered 2016-12-10: 2 g via INTRAVENOUS
  Filled 2016-12-10: qty 50

## 2016-12-10 MED ORDER — ALBUTEROL SULFATE (2.5 MG/3ML) 0.083% IN NEBU
INHALATION_SOLUTION | RESPIRATORY_TRACT | Status: AC
  Filled 2016-12-10: qty 3

## 2016-12-10 MED ORDER — ZOLPIDEM TARTRATE 5 MG PO TABS
5.0000 mg | ORAL_TABLET | Freq: Once | ORAL | Status: AC
  Administered 2016-12-11: 5 mg via ORAL
  Filled 2016-12-10: qty 1

## 2016-12-10 MED ORDER — IPRATROPIUM BROMIDE 0.02 % IN SOLN
0.5000 mg | Freq: Four times a day (QID) | RESPIRATORY_TRACT | Status: DC | PRN
  Administered 2016-12-11: 0.5 mg via RESPIRATORY_TRACT
  Filled 2016-12-10: qty 2.5

## 2016-12-10 MED ORDER — ENOXAPARIN SODIUM 40 MG/0.4ML ~~LOC~~ SOLN
40.0000 mg | SUBCUTANEOUS | Status: DC
  Filled 2016-12-10 (×2): qty 0.4

## 2016-12-10 MED ORDER — ALBUTEROL SULFATE (2.5 MG/3ML) 0.083% IN NEBU
5.0000 mg | INHALATION_SOLUTION | Freq: Once | RESPIRATORY_TRACT | Status: AC
  Administered 2016-12-10: 5 mg via RESPIRATORY_TRACT

## 2016-12-10 MED ORDER — LORATADINE 10 MG PO TABS
10.0000 mg | ORAL_TABLET | Freq: Every day | ORAL | Status: DC
  Administered 2016-12-11 – 2016-12-13 (×4): 10 mg via ORAL
  Filled 2016-12-10 (×4): qty 1

## 2016-12-10 MED ORDER — ALBUTEROL (5 MG/ML) CONTINUOUS INHALATION SOLN
10.0000 mg/h | INHALATION_SOLUTION | Freq: Once | RESPIRATORY_TRACT | Status: AC
  Administered 2016-12-10: 10 mg/h via RESPIRATORY_TRACT
  Filled 2016-12-10: qty 20

## 2016-12-10 MED ORDER — METHYLPREDNISOLONE SODIUM SUCC 125 MG IJ SOLR: 60.0000 mg | Freq: Four times a day (QID) | INTRAMUSCULAR | Status: DC

## 2016-12-10 MED ORDER — POTASSIUM CHLORIDE CRYS ER 20 MEQ PO TBCR
40.0000 meq | EXTENDED_RELEASE_TABLET | Freq: Once | ORAL | Status: AC
  Administered 2016-12-10: 40 meq via ORAL
  Filled 2016-12-10: qty 2

## 2016-12-10 MED ORDER — TRAZODONE HCL 50 MG PO TABS
25.0000 mg | ORAL_TABLET | Freq: Every evening | ORAL | Status: DC | PRN
  Administered 2016-12-11 (×2): 50 mg via ORAL
  Filled 2016-12-10 (×2): qty 1

## 2016-12-10 MED ORDER — SERTRALINE HCL 50 MG PO TABS
50.0000 mg | ORAL_TABLET | Freq: Every day | ORAL | Status: DC
  Administered 2016-12-11 – 2016-12-13 (×4): 50 mg via ORAL
  Filled 2016-12-10 (×5): qty 1

## 2016-12-10 MED ORDER — METFORMIN HCL 500 MG PO TABS
500.0000 mg | ORAL_TABLET | Freq: Two times a day (BID) | ORAL | Status: DC
  Administered 2016-12-11 (×2): 500 mg via ORAL
  Filled 2016-12-10 (×2): qty 1

## 2016-12-10 MED ORDER — GUAIFENESIN ER 600 MG PO TB12
600.0000 mg | ORAL_TABLET | Freq: Two times a day (BID) | ORAL | Status: DC
  Administered 2016-12-11 – 2016-12-13 (×6): 600 mg via ORAL
  Filled 2016-12-10 (×6): qty 1

## 2016-12-10 MED ORDER — ADULT MULTIVITAMIN W/MINERALS CH
1.0000 | ORAL_TABLET | Freq: Every day | ORAL | Status: DC
  Administered 2016-12-11 – 2016-12-13 (×4): 1 via ORAL
  Filled 2016-12-10 (×4): qty 1

## 2016-12-10 MED ORDER — PREDNISONE 20 MG PO TABS
60.0000 mg | ORAL_TABLET | Freq: Once | ORAL | Status: AC
Start: 2016-12-10 — End: 2016-12-10
  Administered 2016-12-10: 60 mg via ORAL
  Filled 2016-12-10: qty 3

## 2016-12-10 NOTE — ED Triage Notes (Signed)
Pt reports ongoing wheezing for past week, denies productive cough, CP, fever, chills.  Pt reports using nebs and inhalers at home with no relief.

## 2016-12-10 NOTE — ED Notes (Signed)
Attempted to call report

## 2016-12-10 NOTE — H&P (Signed)
History and Physical    April [name] DSK:[id number] DOB: [birth year] DOA: 12/10/2016  PCP: Boykin Nearing, MD  Patient coming from: home  I have personally briefly reviwed the [facility] records  Chief Complaint: shortness of breath  HPI: April [name] is a 46 y.o. female with medical history significant of  asthma history of recurrent asthma exacerbations, followed by Dr. Annamaria Boots pulmonologist. Patient admitted with increasing shortness of breath for the last 1 week with associated increase in wheezing. She was prescribed prednisone as an outpatient which hasn't helped much. She denied denies fever chills cough, nausea, vomiting, diarrhea. She does not have oxygen at home. She had an episode of asthma exacerbation a month ago. She gets Omalizumab injection every other week from her pulmonologist. She denies any chest pain. Denies any abdominal pain. She does have complaints of headache which started along with the breathing difficulty. Denies any changes in her vision. Patient denies having obstructive sleep apnea.  ED Course: In the ER she was given a nebulizer treatment and was placed on oxygen. She desaturated when  attempted to walk. In the ER patient was also found to be hypokalemic with a potassium of 2.9. And was given 40 mEq of potassium. She has an elevated white count of 14.8. Chest x-ray showed possible right lower lobe pneumonia.  Review of Systems: As per HPI otherwise 10 point review of systems negative.   Past Medical History:  Diagnosis Date  . Acanthosis nigricans   . Arthritis   . Asthma    Followed by Dr. Annamaria Boots (pulmonology); receives every other week omalizumab injections; has frequent exacerbations  . COPD (chronic obstructive pulmonary disease) ([place])    PFTs in 2002, FEV1/FVC 65, no post bronchodilater test done  . Depression   . GERD (gastroesophageal reflux disease)   . Headache(784.0)   . Helicobacter pylori (H. pylori) infection   . Hypertension, essential     . Insomnia   . Menorrhagia   . Morbid obesity ([place])   . Obesity   . Seasonal allergies   . Shortness of breath   . Sleep apnea    Sleep study 2008 - mild OSA, not enough events to titrate CPAP  . Tobacco user     Past Surgical History:  Procedure Laterality Date  . BREAST REDUCTION SURGERY  09/2011  . TUBAL LIGATION  1996   bilateral     reports that she quit smoking about 2 years ago. Her smoking use included Cigarettes. She has a 9.00 pack-year smoking history. She has never used smokeless tobacco. She reports that she does not drink alcohol or use drugs.  No Known Allergies  Family History  Problem Relation Age of Onset  . Hypertension Mother   . Asthma Daughter   . Cancer Paternal Aunt   . Asthma Maternal Grandmother    Unacceptable: Noncontributory, unremarkable, or negative. Acceptable: Family history reviewed and not pertinent (If you reviewed it)  Prior to Admission medications   Medication Sig Start Date End Date Taking? Authorizing Provider  albuterol (PROAIR HFA) 108 (90 Base) MCG/ACT inhaler Inhale 2 puffs into the lungs every 6 (six) hours as needed for wheezing or shortness of breath. 10/04/16   Reyne Dumas, MD  albuterol (PROVENTIL) (2.5 MG/3ML) 0.083% nebulizer solution Take 3 mLs (2.5 mg total) by nebulization every 4 (four) hours as needed for wheezing or shortness of breath. 10/04/16   Reyne Dumas, MD  amLODipine (NORVASC) 10 MG tablet Take 1 tablet (10 mg total)  by mouth daily. 05/03/16   Funches, Adriana Mccallum, MD  DULERA 200-5 MCG/ACT AERO INHALE 2 PUFFS INTO THE LUNGS TWICE DAILY 11/18/16   Baird Lyons D, MD  fluticasone (FLONASE) 50 MCG/ACT nasal spray Place 2 sprays into both nostrils daily. Patient taking differently: Place 2 sprays into both nostrils daily as needed (congestion).  10/20/16   Rai, Vernelle Emerald, MD  furosemide (LASIX) 20 MG tablet Take 1 tablet (20 mg total) by mouth daily. 10/05/16   Reyne Dumas, MD  guaiFENesin (MUCINEX) 600 MG 12 hr  tablet Take 1 tablet (600 mg total) by mouth 2 (two) times daily. Patient not taking: Reported on 10/27/2016 10/20/16   Rai, Ripudeep K, MD  ipratropium (ATROVENT) 0.02 % nebulizer solution Take 2.5 mLs (0.5 mg total) by nebulization 4 (four) times daily as needed for wheezing or shortness of breath. 06/06/16   Deneise Lever, MD  loratadine (CLARITIN) 10 MG tablet Take 1 tablet (10 mg total) by mouth daily. 08/30/15   [name], MD  losartan (COZAAR) 100 MG tablet Take 1 tablet (100 mg total) by mouth daily. 10/20/16   Rai, Vernelle Emerald, MD  metFORMIN (GLUCOPHAGE) 500 MG tablet Take 1 tablet (500 mg total) by mouth 2 (two) times daily with a meal. 05/07/16   Geradine Girt, DO  methocarbamol (ROBAXIN) 500 MG tablet Take 1 tablet (500 mg total) by mouth 2 (two) times daily. Patient taking differently: Take 500 mg by mouth 2 (two) times daily as needed for muscle spasms.  10/20/16   Rai, Vernelle Emerald, MD  Multiple Vitamin (MULTIVITAMIN WITH MINERALS) TABS tablet Take 1 tablet by mouth daily. One-A-Day    [provider]  nicotine (NICODERM CQ - DOSED IN MG/24 HOURS) 14 mg/24hr patch Place 1 patch (14 mg total) onto the skin daily. Patient taking differently: Place 14 mg onto the skin daily as needed (smoking cessation).  10/04/16   Reyne Dumas, MD  omeprazole (PRILOSEC) 20 MG capsule Take 1 capsule (20 mg total) by mouth daily. Patient taking differently: Take 20 mg by mouth at bedtime.  10/09/16   Deneise Lever, MD  oxyCODONE-acetaminophen (PERCOCET) 5-325 MG tablet Take 1 tablet by mouth every 4 (four) hours as needed. 10/27/16   Pisciotta, Elmyra Ricks, PA-C  predniSONE (DELTASONE) 5 MG tablet Take 1 tablet (5 mg total) by mouth daily with breakfast. 11/15/16   Donne Hazel, MD  senna-docusate (SENOKOT-S) 8.6-50 MG tablet Start 1 tab QHS, up to 2tabs BID. Max 4 tabs daily for constipation 10/27/16   Pisciotta, Elmyra Ricks, PA-C  sertraline (ZOLOFT) 50 MG tablet TAKE 1 TABLET BY MOUTH DAILY 11/18/16   Boykin Nearing, MD  spironolactone (ALDACTONE) 50 MG tablet Take 1 tablet (50 mg total) by mouth daily. 10/04/16   Reyne Dumas, MD  traMADol (ULTRAM) 50 MG tablet Take 1 tablet (50 mg total) by mouth every 6 (six) hours as needed. Patient taking differently: Take 50 mg by mouth every 6 (six) hours as needed (pain).  10/20/16   Rai, Vernelle Emerald, MD  traZODone (DESYREL) 50 MG tablet Take 0.5-1 tablets (25-50 mg total) by mouth at bedtime as needed for sleep. Patient taking differently: Take 50 mg by mouth at bedtime.  10/04/16   Reyne Dumas, MD  zolpidem (AMBIEN) 10 MG tablet Take 1 tablet (10 mg total) by mouth at bedtime as needed for sleep. Patient taking differently: Take 10 mg by mouth at bedtime.  10/10/16 11/13/16  Deneise Lever, MD    Physical Exam:  Vitals:   12/10/16 1338 12/10/16 1342 12/10/16 1653  BP: (!) 158/83  (!) 155/80  Pulse: 97  89  Resp: 18  18  Temp: 98.5 F (36.9 C)    TempSrc: Oral    SpO2: 99%  99%  Weight:  (!) 146.5 kg (323 lb)   Height:  5\' 6"  (1.676 m)     Constitutional: NAD, calm, comfortable Vitals:   12/10/16 1338 12/10/16 1342 12/10/16 1653  BP: (!) 158/83  (!) 155/80  Pulse: 97  89  Resp: 18  18  Temp: 98.5 F (36.9 C)    TempSrc: Oral    SpO2: 99%  99%  Weight:  (!) 146.5 kg (323 lb)   Height:  5\' 6"  (1.676 m)    Eyes: PERRL, lids and conjunctivae normal ENMT: Mucous membranes are moist. Posterior pharynx clear of any exudate or lesions.Normal dentition.  Neck: normal, supple, no masses, no thyromegaly Respiratory: clear to auscultation bilaterally, no wheezing, no crackles. Normal respiratory effort. No accessory muscle use.  Cardiovascular: Regular rate and rhythm, no murmurs / rubs / gallops. No extremity edema. 2+ pedal pulses. No carotid bruits.  Abdomen: no tenderness, no masses palpated. No hepatosplenomegaly. Bowel sounds positive.  Musculoskeletal: no clubbing / cyanosis. No joint deformity upper and lower extremities. Good ROM, no  contractures. Normal muscle tone.  Skin: no rashes, lesions, ulcers. No induration Neurologic: CN 2-12 grossly intact. Sensation intact, DTR normal. Strength 5/5 in all 4.  Psychiatric: Normal judgment and insight. Alert and oriented x 3. Normal mood.   (Anything < 9 systems with 2 bullets each down codes to level 1) (If patient refuses exam can't bill higher level) (Make sure to document decubitus ulcers present on admission -- if possible -- and whether patient has chronic indwelling catheter at time of admission)  Labs on Admission: I have personally reviewed following labs and imaging studies  CBC:  Recent Labs Lab 12/10/16 1545  WBC 14.8*  NEUTROABS 9.9*  HGB 11.7*  HCT 38.3  MCV 84.7  PLT 858   Basic Metabolic Panel:  Recent Labs Lab 12/10/16 1545  NA 140  K 2.9*  CL 105  CO2 27  GLUCOSE 126*  BUN 6  CREATININE 0.82  CALCIUM 8.1*   GFR: Estimated Creatinine Clearance: 130.2 mL/min (by C-G formula based on SCr of 0.82 mg/dL). Liver Function Tests: No results for input(s): AST, ALT, ALKPHOS, BILITOT, PROT, ALBUMIN in the last 168 hours. No results for input(s): LIPASE, AMYLASE in the last 168 hours. No results for input(s): AMMONIA in the last 168 hours. Coagulation Profile: No results for input(s): INR, PROTIME in the last 168 hours. Cardiac Enzymes: No results for input(s): CKTOTAL, CKMB, CKMBINDEX, TROPONINI in the last 168 hours. BNP (last 3 results) No results for input(s): PROBNP in the last 8760 hours. HbA1C: No results for input(s): HGBA1C in the last 72 hours. CBG: No results for input(s): GLUCAP in the last 168 hours. Lipid Profile: No results for input(s): CHOL, HDL, LDLCALC, TRIG, CHOLHDL, LDLDIRECT in the last 72 hours. Thyroid Function Tests: No results for input(s): TSH, T4TOTAL, FREET4, T3FREE, THYROIDAB in the last 72 hours. Anemia Panel: No results for input(s): VITAMINB12, FOLATE, FERRITIN, TIBC, IRON, RETICCTPCT in the last 72  hours. Urine analysis:    Component Value Date/Time   COLORURINE AMBER (A) 11/13/2016 1830   APPEARANCEUR HAZY (A) 11/13/2016 1830   LABSPEC 1.023 11/13/2016 1830   PHURINE 5.0 11/13/2016 1830   GLUCOSEU NEGATIVE 11/13/2016 1830   GLUCOSEU  NEG mg/dL 10/28/2007 2049   HGBUR NEGATIVE 11/13/2016 1830   BILIRUBINUR SMALL (A) 11/13/2016 1830   KETONESUR NEGATIVE 11/13/2016 1830   PROTEINUR 30 (A) 11/13/2016 1830   UROBILINOGEN 1.0 11/21/2014 0707   NITRITE NEGATIVE 11/13/2016 1830   LEUKOCYTESUR NEGATIVE 11/13/2016 1830    Radiological Exams on Admission: Dg Chest 2 View  Result Date: 12/10/2016 CLINICAL DATA:  One week history of asthmatic symptoms including shortness of breath. History of asthma -COPD. Former smoker. EXAM: CHEST  2 VIEW COMPARISON:  PA and lateral chest x-ray of November 13, 2016 FINDINGS: The lungs are adequately inflated. There are slightly increased lung markings overlying the lower thoracic spine not clearly evident on the frontal view. There is no pleural effusion. The heart and pulmonary vascularity are normal. The trachea is midline. IMPRESSION: Chronic bronchitic-reactive airway changes. Atelectasis or early pneumonia posteriorly likely in the right lower lobe. Followup PA and lateral chest X-ray is recommended in 3-4 weeks following trial of antibiotic therapy to ensure resolution and exclude underlying malignancy. Electronically Signed   By: David  Martinique M.D.   On: 12/10/2016 14:37    EKG: Independently reviewed.   Assessment/Plan Active Problems:   * No active hospital problems. *   Asthma Exacerbation/RLL pneumonia Patient gets frequent asthma exacerbations.followed by pulmonary dr young. Will treat her with Solu-Medrol 60 mg IV every 6. DuoNeb SVN treatments. Augmentin 875 mg twice a day for 7 days.  Hypokalemia probably related to equivalent SVN treatments at home. Received 40 mEq of potassium in the ER. I will give her an additional 40 mEq today. Follow-up  levels tomorrow.  Leukocytosis secondary to pneumonia and asthma exacerbation is likely this.     DVT prophylaxis: lovenox Code Status:full Family Communication:  Disposition Plan home when stable. Consults called: none Admission status: inpatient   [name] MD [facility]  If 7PM-7AM, please contact night-coverage [email or web address] Password [facility]  12/10/2016, 5:38 PM

## 2016-12-10 NOTE — ED Notes (Signed)
Pt able to ambulate independently 

## 2016-12-10 NOTE — ED Provider Notes (Signed)
[facility] DEPT Provider Note   CSN: [id number] Arrival date & time: 12/10/16  1306     History   Chief Complaint Chief Complaint  Patient presents with  . Asthma    HPI April [name] is a 46 y.o. female.  HPI    46 year old female with a significant past medical history of frequent asthma exacerbations presents today with complaints of shortness of breath. Patient notes for the last week she's had wheezing. Patient denies any productive cough, chest pain, fevers or chills. Patient notes that she has a nebulizer at home that she's been using without significant improvement in her symptoms. Patient and she recently finished a course of prednisone yesterday after recent asthma exacerbation.    Past Medical History:  Diagnosis Date  . Acanthosis nigricans   . Arthritis   . Asthma    Followed by Dr. Annamaria Boots (pulmonology); receives every other week omalizumab injections; has frequent exacerbations  . COPD (chronic obstructive pulmonary disease) ([place])    PFTs in 2002, FEV1/FVC 65, no post bronchodilater test done  . Depression   . GERD (gastroesophageal reflux disease)   . Headache(784.0)   . Helicobacter pylori (H. pylori) infection   . Hypertension, essential   . Insomnia   . Menorrhagia   . Morbid obesity ([place])   . Obesity   . Seasonal allergies   . Shortness of breath   . Sleep apnea    Sleep study 2008 - mild OSA, not enough events to titrate CPAP  . Tobacco user     Patient Active Problem List   Diagnosis Date Noted  . Asthma exacerbation in COPD ([place]) 12/10/2016  . Moderate persistent asthma with (acute) exacerbation 11/13/2016  . Moderate persistent asthma with exacerbation 10/19/2016  . Diarrhea 10/19/2016  . Type 2 diabetes mellitus without complication, without long-term current use of insulin ([place]) 10/02/2016  . Normocytic anemia 10/02/2016  . Respiratory failure ([place]) 10/02/2016  . Hypertensive urgency, malignant 06/22/2016  . Pulmonary edema  06/22/2016  . Hypertensive urgency 06/22/2016  . Malignant hypertension due to primary aldosteronism ([place]) 05/05/2016  . Lip laceration 05/05/2016  . Elevated hemoglobin A1c 05/05/2016  . Asthma exacerbation 11/10/2015  . GERD (gastroesophageal reflux disease) 08/30/2015  . Generalized anxiety disorder 08/30/2015  . Seasonal allergic rhinitis 08/29/2013  . Leukocytosis 10/21/2012  . Tobacco abuse 10/07/2012  . Asthma, chronic obstructive, without status asthmaticus ([place]) 05/07/2012  . Knee pain, bilateral 04/25/2011  . Primary insomnia 03/14/2011  . Mild obstructive sleep apnea 12/19/2010  . Hypokalemia 08/13/2010  . Cervical back pain with evidence of disc disease 04/08/2008  . Essential hypertension 07/31/2006  . Morbid obesity with body mass index of 50.0-59.9 in adult ([place]) 06/17/2006  . Major depressive disorder, recurrent episode ([place]) 04/10/2006    Past Surgical History:  Procedure Laterality Date  . BREAST REDUCTION SURGERY  09/2011  . TUBAL LIGATION  1996   bilateral    OB History    No data available      Home Medications    Prior to Admission medications   Medication Sig Start Date End Date Taking? Authorizing Provider  albuterol (PROAIR HFA) 108 (90 Base) MCG/ACT inhaler Inhale 2 puffs into the lungs every 6 (six) hours as needed for wheezing or shortness of breath. 10/04/16  Yes Reyne Dumas, MD  albuterol (PROVENTIL) (2.5 MG/3ML) 0.083% nebulizer solution Take 3 mLs (2.5 mg total) by nebulization every 4 (four) hours as needed for wheezing or shortness of breath. 10/04/16  Yes Reyne Dumas,  MD  amLODipine (NORVASC) 10 MG tablet Take 1 tablet (10 mg total) by mouth daily. 05/03/16  Yes Funches, Josalyn, MD  diphenhydramine-acetaminophen (TYLENOL PM) 25-500 MG TABS tablet Take 1 tablet by mouth at bedtime as needed (for sleep).    Yes [provider]  DULERA 200-5 MCG/ACT AERO INHALE 2 PUFFS INTO THE LUNGS TWICE DAILY 11/18/16  Yes Young, Clinton D, MD    fluticasone (FLONASE) 50 MCG/ACT nasal spray Place 2 sprays into both nostrils daily. Patient taking differently: Place 2 sprays into both nostrils daily as needed (for congestion).  10/20/16  Yes Rai, Ripudeep K, MD  furosemide (LASIX) 20 MG tablet Take 1 tablet (20 mg total) by mouth daily. 10/05/16  Yes Reyne Dumas, MD  ipratropium (ATROVENT) 0.02 % nebulizer solution Take 2.5 mLs (0.5 mg total) by nebulization 4 (four) times daily as needed for wheezing or shortness of breath. 06/06/16  Yes Young, Tarri Fuller D, MD  loratadine (CLARITIN) 10 MG tablet Take 1 tablet (10 mg total) by mouth daily. 08/30/15  Yes [name], MD  losartan (COZAAR) 100 MG tablet Take 1 tablet (100 mg total) by mouth daily. 10/20/16  Yes Rai, Ripudeep K, MD  metFORMIN (GLUCOPHAGE) 500 MG tablet Take 1 tablet (500 mg total) by mouth 2 (two) times daily with a meal. 05/07/16  Yes Vann, Jessica U, DO  methocarbamol (ROBAXIN) 500 MG tablet Take 1 tablet (500 mg total) by mouth 2 (two) times daily. Patient taking differently: Take 500 mg by mouth 2 (two) times daily as needed for muscle spasms.  10/20/16  Yes Rai, Ripudeep K, MD  Multiple Vitamin (MULTIVITAMIN WITH MINERALS) TABS tablet Take 1 tablet by mouth daily. One-A-Day   Yes [provider]  nicotine (NICODERM CQ - DOSED IN MG/24 HOURS) 14 mg/24hr patch Place 1 patch (14 mg total) onto the skin daily. Patient taking differently: Place 14 mg onto the skin daily as needed (smoking cessation).  10/04/16  Yes Reyne Dumas, MD  omeprazole (PRILOSEC) 20 MG capsule Take 1 capsule (20 mg total) by mouth daily. Patient taking differently: Take 20 mg by mouth 2 (two) times daily.  10/09/16  Yes Young, Tarri Fuller D, MD  oxyCODONE-acetaminophen (PERCOCET) 5-325 MG tablet Take 1 tablet by mouth every 4 (four) hours as needed. Patient taking differently: Take 1 tablet by mouth every 4 (four) hours as needed (for pain).  10/27/16  Yes Pisciotta, Elmyra Ricks, PA-C  senna-docusate (SENOKOT-S)  8.6-50 MG tablet Start 1 tab QHS, up to 2tabs BID. Max 4 tabs daily for constipation Patient taking differently: Take 1-2 tablets by mouth daily as needed for mild constipation.  10/27/16  Yes Pisciotta, Elmyra Ricks, PA-C  sertraline (ZOLOFT) 50 MG tablet TAKE 1 TABLET BY MOUTH DAILY Patient taking differently: Take 50 mg by mouth once a day 11/18/16  Yes Funches, Josalyn, MD  spironolactone (ALDACTONE) 50 MG tablet Take 1 tablet (50 mg total) by mouth daily. 10/04/16  Yes Reyne Dumas, MD  traZODone (DESYREL) 50 MG tablet Take 0.5-1 tablets (25-50 mg total) by mouth at bedtime as needed for sleep. Patient taking differently: Take 50 mg by mouth at bedtime.  10/04/16  Yes Reyne Dumas, MD  zolpidem (AMBIEN) 10 MG tablet Take 1 tablet (10 mg total) by mouth at bedtime as needed for sleep. 10/10/16 12/10/16 Yes Deneise Lever, MD    Family History Family History  Problem Relation Age of Onset  . Hypertension Mother   . Asthma Daughter   . Cancer Paternal Aunt   .  Asthma Maternal Grandmother     Social History Social History  Substance Use Topics  . Smoking status: Former Smoker    Packs/day: 0.50    Years: 18.00    Types: Cigarettes    Quit date: 04/29/2014  . Smokeless tobacco: Never Used  . Alcohol use No     Allergies   Patient has no known allergies.   Review of Systems Review of Systems  All other systems reviewed and are negative.  Physical Exam Updated Vital Signs BP (!) 155/80   Pulse 89   Temp 98.5 F (36.9 C) (Oral)   Resp 18   Ht 5\' 6"  (1.676 m)   Wt (!) 146.5 kg (323 lb)   LMP 10/10/2016 (Approximate)   SpO2 99%   BMI 52.13 kg/m   Physical Exam  Constitutional: She is oriented to person, place, and time. She appears well-developed and well-nourished.  HENT:  Head: Normocephalic and atraumatic.  Eyes: Pupils are equal, round, and reactive to light. Conjunctivae are normal. Right eye exhibits no discharge. Left eye exhibits no discharge. No scleral icterus.   Neck: Normal range of motion. No JVD present. No tracheal deviation present.  Cardiovascular: Normal rate and regular rhythm.   Pulmonary/Chest: Effort normal. No stridor. No respiratory distress. She has wheezes. She has no rales. She exhibits no tenderness.  Bilateral expiratory wheeze - no crackles noted  Neurological: She is alert and oriented to person, place, and time. Coordination normal.  Psychiatric: She has a normal mood and affect. Her behavior is normal. Judgment and thought content normal.  Nursing note and vitals reviewed.    ED Treatments / Results  Labs (all labs ordered are listed, but only abnormal results are displayed) Labs Reviewed  CBC WITH DIFFERENTIAL/PLATELET - Abnormal; Notable for the following:       Result Value   WBC 14.8 (*)    Hemoglobin 11.7 (*)    MCH 25.9 (*)    RDW 19.4 (*)    Neutro Abs 9.9 (*)    Monocytes Absolute 1.6 (*)    All other components within normal limits  BASIC METABOLIC PANEL - Abnormal; Notable for the following:    Potassium 2.9 (*)    Glucose, Bld 126 (*)    Calcium 8.1 (*)    All other components within normal limits  CBC  CREATININE, SERUM  BASIC METABOLIC PANEL  CBC    EKG  EKG Interpretation None       Radiology Dg Chest 2 View  Result Date: 12/10/2016 CLINICAL DATA:  One week history of asthmatic symptoms including shortness of breath. History of asthma -COPD. Former smoker. EXAM: CHEST  2 VIEW COMPARISON:  PA and lateral chest x-ray of November 13, 2016 FINDINGS: The lungs are adequately inflated. There are slightly increased lung markings overlying the lower thoracic spine not clearly evident on the frontal view. There is no pleural effusion. The heart and pulmonary vascularity are normal. The trachea is midline. IMPRESSION: Chronic bronchitic-reactive airway changes. Atelectasis or early pneumonia posteriorly likely in the right lower lobe. Followup PA and lateral chest X-ray is recommended in 3-4 weeks following  trial of antibiotic therapy to ensure resolution and exclude underlying malignancy. Electronically Signed   By: David  Martinique M.D.   On: 12/10/2016 14:37    Procedures Procedures (including critical care time)  Medications Ordered in ED Medications  albuterol (PROVENTIL) (2.5 MG/3ML) 0.083% nebulizer solution (  Not Given 12/10/16 1513)  albuterol (PROVENTIL) (2.5 MG/3ML) 0.083% nebulizer solution (  Not Given 12/10/16 1513)  methylPREDNISolone sodium succinate (SOLU-MEDROL) 125 mg/2 mL injection 60 mg (not administered)  amoxicillin-clavulanate (AUGMENTIN) 875-125 MG per tablet 1 tablet (not administered)  albuterol (PROVENTIL) (2.5 MG/3ML) 0.083% nebulizer solution 2.5 mg (not administered)  amLODipine (NORVASC) tablet 10 mg (not administered)  mometasone-formoterol (DULERA) 200-5 MCG/ACT inhaler 2 puff (not administered)  fluticasone (FLONASE) 50 MCG/ACT nasal spray 2 spray (not administered)  guaiFENesin (MUCINEX) 12 hr tablet 600 mg (not administered)  ipratropium (ATROVENT) nebulizer solution 0.5 mg (not administered)  loratadine (CLARITIN) tablet 10 mg (not administered)  losartan (COZAAR) tablet 100 mg (not administered)  metFORMIN (GLUCOPHAGE) tablet 500 mg (not administered)  methocarbamol (ROBAXIN) tablet 500 mg (not administered)  multivitamin with minerals tablet 1 tablet (not administered)  nicotine (NICODERM CQ - dosed in mg/24 hours) patch 14 mg (not administered)  oxyCODONE-acetaminophen (PERCOCET/ROXICET) 5-325 MG per tablet 1 tablet (not administered)  sertraline (ZOLOFT) tablet 50 mg (not administered)  spironolactone (ALDACTONE) tablet 50 mg (not administered)  traZODone (DESYREL) tablet 25-50 mg (not administered)  enoxaparin (LOVENOX) injection 40 mg (not administered)  lactobacillus (FLORANEX/LACTINEX) granules 1 g (not administered)  albuterol (PROVENTIL) (2.5 MG/3ML) 0.083% nebulizer solution 5 mg (5 mg Nebulization Given 12/10/16 1348)  albuterol  (PROVENTIL,VENTOLIN) solution continuous neb (10 mg/hr Nebulization Given 12/10/16 1513)  predniSONE (DELTASONE) tablet 60 mg (60 mg Oral Given 12/10/16 1626)  albuterol (PROVENTIL,VENTOLIN) solution continuous neb (10 mg/hr Nebulization Given 12/10/16 1707)  magnesium sulfate IVPB 2 g 50 mL (0 g Intravenous Stopped 12/10/16 1919)  potassium chloride SA (K-DUR,KLOR-CON) CR tablet 40 mEq (40 mEq Oral Given 12/10/16 1816)     Initial Impression / Assessment and Plan / ED Course  I have reviewed the triage vital signs and the nursing notes.  Pertinent labs & imaging results that were available during my care of the patient were reviewed by me and considered in my medical decision making (see chart for details).      Final Clinical Impressions(s) / ED Diagnoses   Final diagnoses:  Severe persistent asthma with exacerbation    Labs: CBC, BMP  Imaging:DG chest 2 view  Consults:  Therapeutics: albuterol  Discharge Meds:   Assessment/Plan: 46 year old female presents today with asthma exacerbation likely secondary to pneumonia. Patient is afebrile, but in respiratory distress. Patient received several breathing treatments here, she still had significant wheeze. I ambulated her here with no to monitor patient dropped into the upper 80s and was severely dyspneic. Due to findings continued difficulty breathing patient will need hospitalization for ongoing management. [facility] consult at who evaluated the patient here in the ED for hospital admission.      New Prescriptions New Prescriptions   No medications on file     Francee Gentile 12/10/16 [address], Fredia Sorrow, MD 12/11/16 [phone]

## 2016-12-11 ENCOUNTER — Encounter (HOSPITAL_COMMUNITY): Payer: Self-pay | Admitting: General Practice

## 2016-12-11 DIAGNOSIS — J45901 Unspecified asthma with (acute) exacerbation: Secondary | ICD-10-CM

## 2016-12-11 DIAGNOSIS — J4551 Severe persistent asthma with (acute) exacerbation: Secondary | ICD-10-CM

## 2016-12-11 DIAGNOSIS — J441 Chronic obstructive pulmonary disease with (acute) exacerbation: Secondary | ICD-10-CM

## 2016-12-11 DIAGNOSIS — E119 Type 2 diabetes mellitus without complications: Secondary | ICD-10-CM

## 2016-12-11 LAB — BASIC METABOLIC PANEL
Anion gap: 8 (ref 5–15)
BUN: 6 mg/dL (ref 6–20)
CALCIUM: 8.5 mg/dL — AB (ref 8.9–10.3)
CHLORIDE: 104 mmol/L (ref 101–111)
CO2: 27 mmol/L (ref 22–32)
CREATININE: 0.76 mg/dL (ref 0.44–1.00)
GFR calc non Af Amer: >60 mL/min (ref >60)
Glucose, Bld: 160 mg/dL — ABNORMAL HIGH (ref 65–99)
Potassium: 4 mmol/L (ref 3.5–5.1)
SODIUM: 139 mmol/L (ref 135–145)

## 2016-12-11 LAB — GLUCOSE, CAPILLARY
Glucose-Capillary: 125 mg/dL — ABNORMAL HIGH (ref 65–99)
Glucose-Capillary: 153 mg/dL — ABNORMAL HIGH (ref 65–99)

## 2016-12-11 LAB — CBC
HCT: 37.5 % (ref 36.0–46.0)
Hemoglobin: 11.7 g/dL — ABNORMAL LOW (ref 12.0–15.0)
MCH: 26.5 pg (ref 26.0–34.0)
MCHC: 31.2 g/dL (ref 30.0–36.0)
MCV: 85 fL (ref 78.0–100.0)
PLATELETS: 355 10*3/uL (ref 150–400)
RBC: 4.41 MIL/uL (ref 3.87–5.11)
RDW: 19.6 % — AB (ref 11.5–15.5)
WBC: 14.1 10*3/uL — ABNORMAL HIGH (ref 4.0–10.5)

## 2016-12-11 IMAGING — CR DG CHEST 2V
2 series · 2 of 2 positions shown · non-contrast
Comparison: Radiographs 11/10/2015, most recent chest CT 08/23/2015

CLINICAL DATA: Asthma exacerbation.

EXAM:
CHEST  2 VIEW

[chest pa]
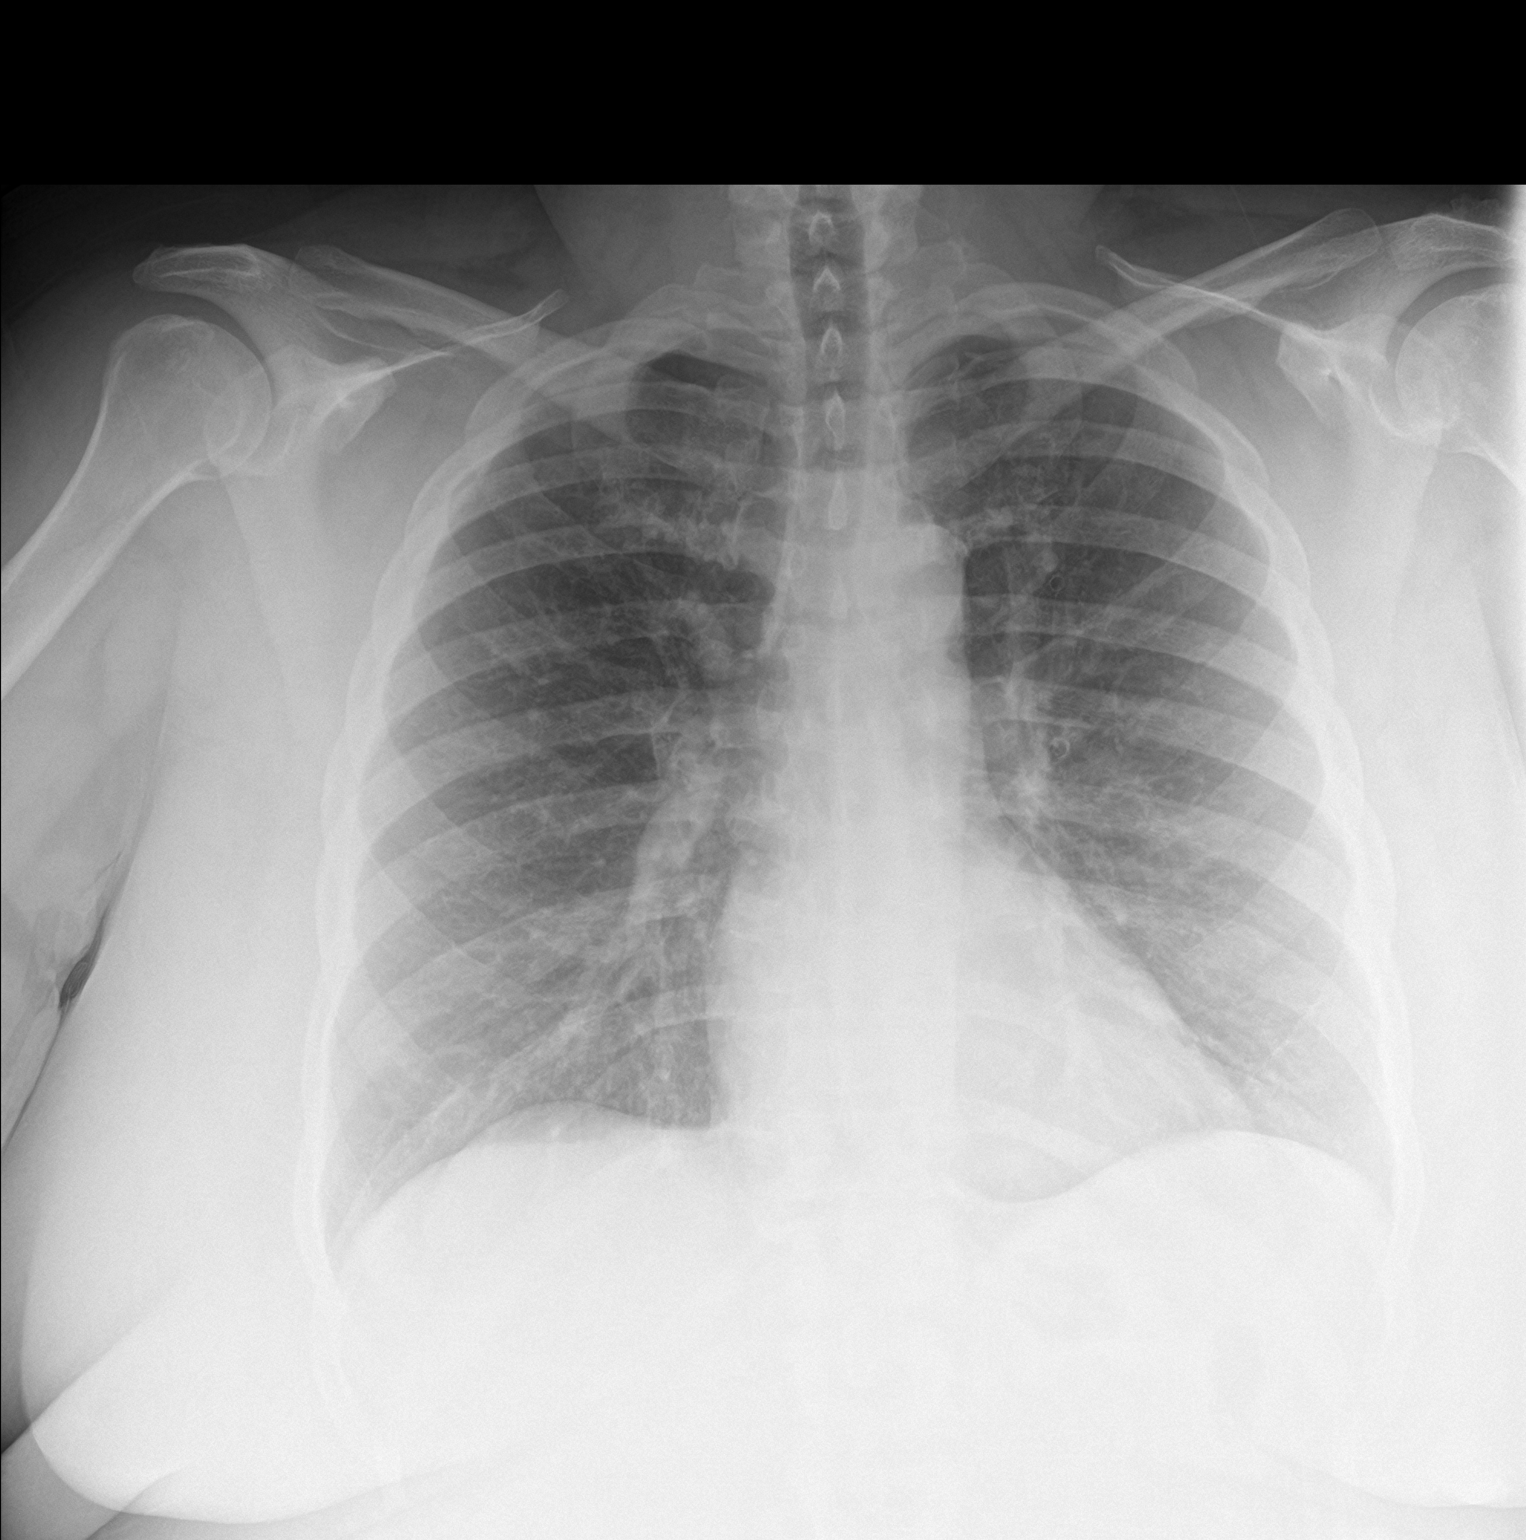

[chest lat]
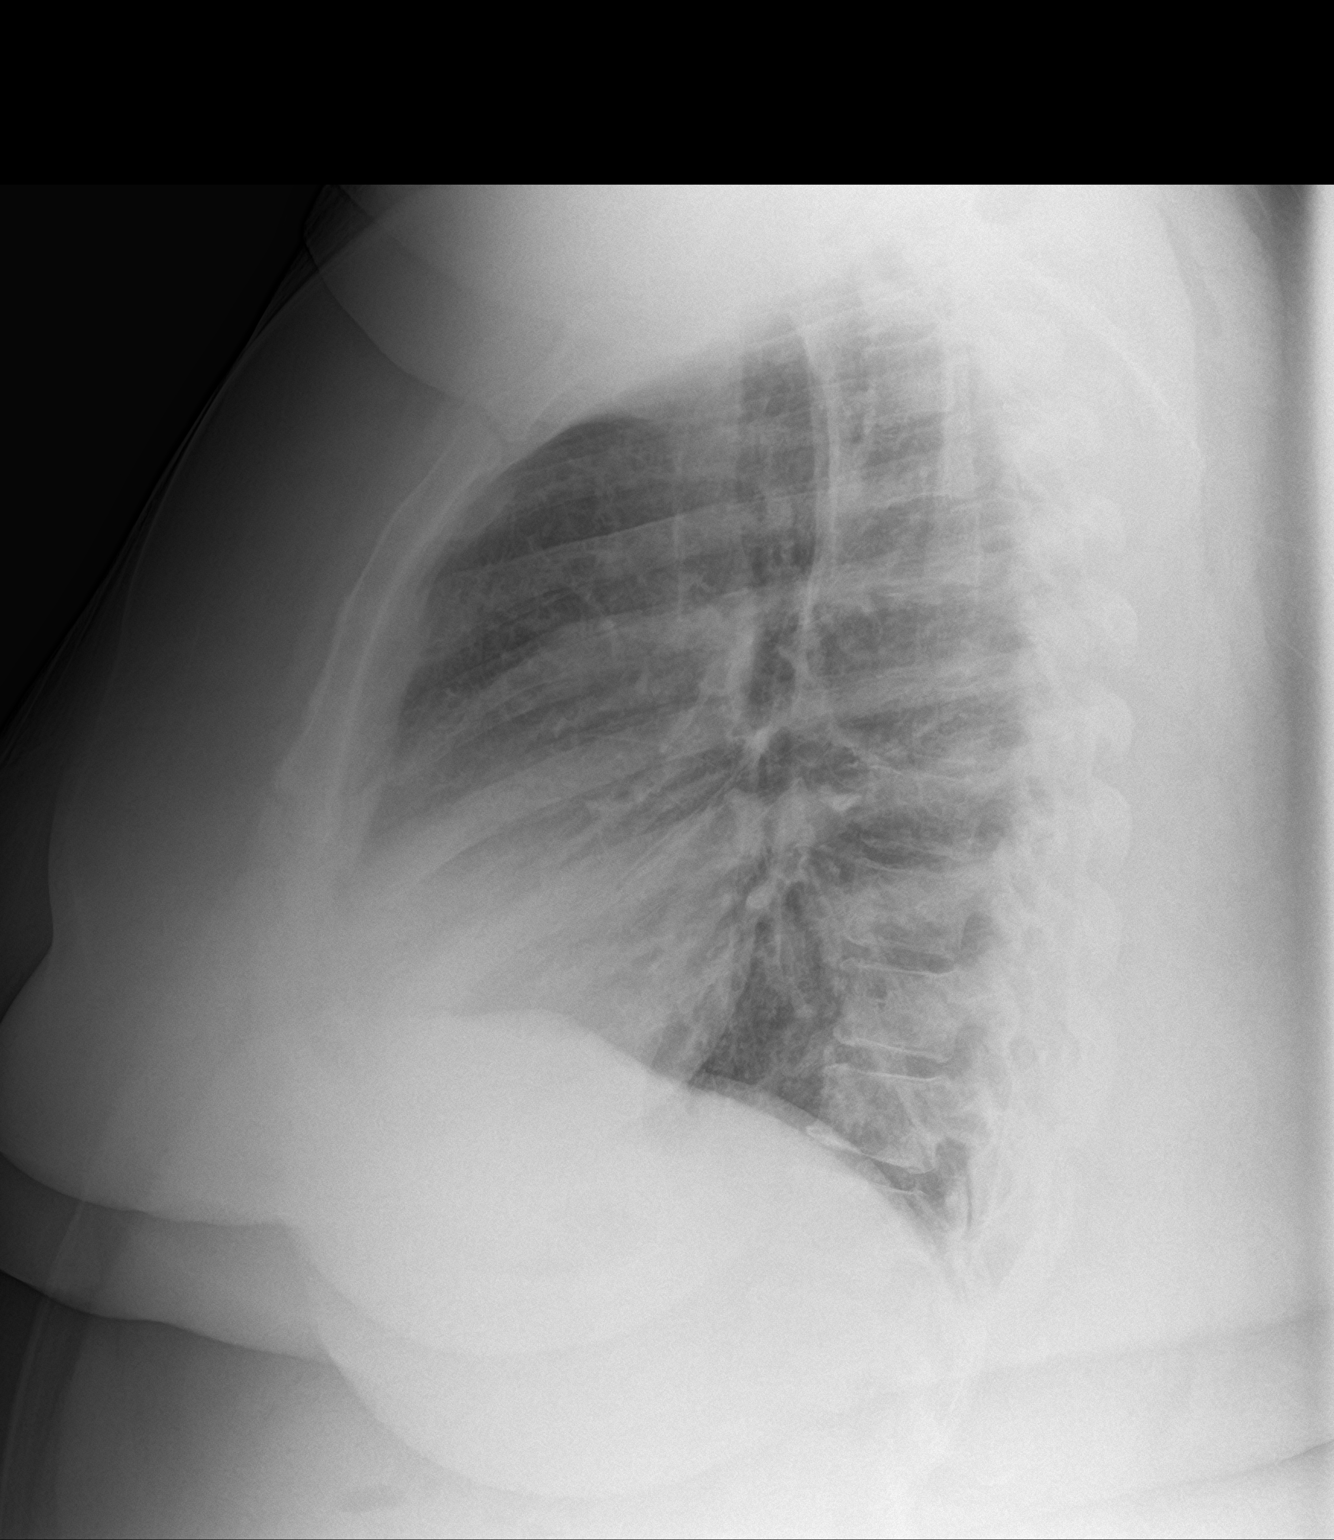

[2 of 2 positions shown; findings below may reference images not displayed]

FINDINGS: Chronic bronchial thickening and borderline hyperinflation. No focal
airspace disease. Heart size and mediastinal contours are unchanged.
No pleural effusion or pneumothorax. Unchanged osseous structures.
IMPRESSION: Chronic bronchial thickening.  No new abnormality.

## 2016-12-11 MED ORDER — INSULIN ASPART 100 UNIT/ML ~~LOC~~ SOLN
0.0000 [IU] | Freq: Three times a day (TID) | SUBCUTANEOUS | Status: DC
  Administered 2016-12-11: 2 [IU] via SUBCUTANEOUS
  Administered 2016-12-12 (×2): 3 [IU] via SUBCUTANEOUS

## 2016-12-11 MED ORDER — IPRATROPIUM-ALBUTEROL 0.5-2.5 (3) MG/3ML IN SOLN
3.0000 mL | Freq: Four times a day (QID) | RESPIRATORY_TRACT | Status: DC
  Administered 2016-12-11 – 2016-12-12 (×4): 3 mL via RESPIRATORY_TRACT
  Filled 2016-12-11 (×4): qty 3

## 2016-12-11 MED ORDER — METOPROLOL TARTRATE 25 MG PO TABS: 25.0000 mg | ORAL_TABLET | Freq: Two times a day (BID) | ORAL | Status: DC

## 2016-12-11 MED ORDER — METOPROLOL TARTRATE 12.5 MG HALF TABLET
12.5000 mg | ORAL_TABLET | Freq: Two times a day (BID) | ORAL | Status: DC
  Administered 2016-12-11 – 2016-12-13 (×5): 12.5 mg via ORAL
  Filled 2016-12-11 (×5): qty 1

## 2016-12-11 MED ORDER — INSULIN ASPART 100 UNIT/ML ~~LOC~~ SOLN: 0.0000 [IU] | Freq: Every day | SUBCUTANEOUS | Status: DC

## 2016-12-11 MED ORDER — FUROSEMIDE 20 MG PO TABS
20.0000 mg | ORAL_TABLET | Freq: Every day | ORAL | Status: DC
  Administered 2016-12-11 – 2016-12-13 (×3): 20 mg via ORAL
  Filled 2016-12-11 (×3): qty 1

## 2016-12-11 MED ORDER — ALBUTEROL SULFATE (2.5 MG/3ML) 0.083% IN NEBU
2.5000 mg | INHALATION_SOLUTION | RESPIRATORY_TRACT | Status: DC | PRN
  Administered 2016-12-11 (×2): 2.5 mg via RESPIRATORY_TRACT
  Filled 2016-12-11 (×2): qty 3

## 2016-12-11 MED ORDER — ZOLPIDEM TARTRATE 5 MG PO TABS
10.0000 mg | ORAL_TABLET | Freq: Every evening | ORAL | Status: DC | PRN
Start: 2016-12-11 — End: 2016-12-13
  Administered 2016-12-11 – 2016-12-12 (×2): 10 mg via ORAL
  Filled 2016-12-11 (×2): qty 2

## 2016-12-11 NOTE — Progress Notes (Signed)
[facility]                                                                              Patient Demographics  April [name], is a 46 y.o. female, DOB - 02-Jul-1970, NOM:[id number]  Admit date - 12/10/2016   Admitting Physician [name], MD  Outpatient Primary MD for the patient is Boykin Nearing, MD  Outpatient specialists:   LOS - 1  days   Medical records reviewed and are as summarized below:    Chief Complaint  Patient presents with  . Asthma       Brief summary   April [name] is a 46 y.o. female with medical history significant of  asthma history of recurrent asthma exacerbations, followed by Dr. Annamaria Boots pulmonologist. Patient admitted with increasing shortness of breath for the last 1 week with associated increase in wheezing. She was prescribed prednisone as an outpatient which hasn't helped much. She denied denies fever chills cough, nausea, vomiting, diarrhea. She does not have oxygen at home. She had an episode of asthma exacerbation a month ago. She gets Omalizumab injection every other week from her pulmonologist. She denies any chest pain.Chest x-ray showed possible right lower lobe pneumonia. The patient was admitted for further workup.    Assessment & Plan    Active Problems:   Asthma exacerbation in COPD ([place]) - Still wheezing, continue scheduled duonebs q6hrs, dulera - Continue IV Solu-Medrol,  - Continue Augmentin, home O2 evaluation prior to discharge tomorrow - Continue Augmentin - Continue nicotine patch  Right lower lobe pneumonia  - Continue Augmentin, outpatient follow-up with pulmonology with Dr. Annamaria Boots -  repeat chest x-ray in 4-6 weeks to ensure complete resolution of pneumonia   hypokalemia  -  resolved   History of grade 1 diastolic dysfunction/CHF - 2-D echo in 6/18 showed EF of 55-60% with grade 1 diastolic dysfunction - Continue Lasix, spironolactone, currently euvolemic  Diabetes mellitus - Placed on sliding  scale insulin, hold metformin  - obtain hemoglobin A1c   Tobacco abuse -Counseled on tobacco cessation, placed on nicotine patch  Hypertension - Continue amlodipine, losartan, added low-dose metoprolol   Code Status: full  DVT Prophylaxis:  Lovenox  Family Communication: Discussed in detail with the patient, all imaging results, lab results explained to the patient    Disposition Plan : Hopefully in a.m.   Time Spent in minutes   25  minutes  Procedures:    Consultants:     Antimicrobials:    Augmentin 8/14    Medications  Scheduled Meds: . amLODipine  10 mg Oral Daily  . amoxicillin-clavulanate  1 tablet Oral Q12H  . enoxaparin (LOVENOX) injection  40 mg Subcutaneous Q24H  . fluticasone  2 spray Each Nare Daily  . guaiFENesin  600 mg Oral BID  . ipratropium-albuterol  3 mL Nebulization Q6H  . lactobacillus  1 g Oral TID WC  . loratadine  10 mg Oral Daily  . losartan  100 mg Oral Daily  . metFORMIN  500 mg Oral BID WC  . methylPREDNISolone (SOLU-MEDROL) injection  60 mg Intravenous Q6H  . metoprolol tartrate  25 mg Oral  BID  . mometasone-formoterol  2 puff Inhalation BID  . multivitamin with minerals  1 tablet Oral Daily  . sertraline  50 mg Oral Daily  . spironolactone  50 mg Oral Daily   Continuous Infusions: PRN Meds:.albuterol, ipratropium, methocarbamol, nicotine, oxyCODONE-acetaminophen, traZODone   Antibiotics   Anti-infectives    Start     Dose/Rate Route Frequency Ordered Stop   12/10/16 2200  amoxicillin-clavulanate (AUGMENTIN) 875-125 MG per tablet 1 tablet  Status:  Discontinued     1 tablet Oral Every 12 hours 12/10/16 1853 12/10/16 1907   12/10/16 1915  amoxicillin-clavulanate (AUGMENTIN) 875-125 MG per tablet 1 tablet     1 tablet Oral Every 12 hours 12/10/16 1831          Subjective:   Haywood Lasso was seen and examined today. Still wheezing, BP elevated.  Patient denies dizziness, chest pain, abdominal pain, N/V/D/C, new weakness,  numbess, tingling. No acute events overnight.    Objective:   Vitals:   12/10/16 1653 12/10/16 2228 12/11/16 0458 12/11/16 1121  BP: (!) 155/80 (!) 145/87 (!) 165/72 (!) 170/79  Pulse: 89 87 71 70  Resp: 18 19 20 20   Temp:  98.6 F (37 C) 98.1 F (36.7 C) 98.6 F (37 C)  TempSrc:  Oral Oral Oral  SpO2: 99% 98% 99% 100%  Weight:  (!) 146.2 kg (322 lb 5 oz)    Height:        Intake/Output Summary (Last 24 hours) at 12/11/16 1407 Last data filed at 12/11/16 5784  Gross per 24 hour  Intake                0 ml  Output                0 ml  Net                0 ml     Wt Readings from Last 3 Encounters:  12/10/16 (!) 146.2 kg (322 lb 5 oz)  11/14/16 (!) 154 kg (339 lb 8 oz)  10/27/16 (!) 149.7 kg (330 lb)     Exam  General: Alert and oriented x 3, NAD  Eyes: PERRLA, EOMI, Anicteric Sclera,  HEENT:  Atraumatic, normocephalic, normal oropharynx  Cardiovascular: S1 S2 auscultated, no rubs, murmurs or gallops. Regular rate and rhythm.  Respiratory: Bilateral expiratory wheezing   Gastrointestinal: Soft, nontender, nondistended, + bowel sounds  Ext: no pedal edema bilaterally  Neuro: AAOx3, Cr N's II- XII. Strength 5/5 upper and lower extremities bilaterally, speech clear, sensations grossly intact  Musculoskeletal: No digital cyanosis, clubbing  Skin: No rashes  Psych: Normal affect and demeanor, alert and oriented x3    Data Reviewed:  I have personally reviewed following labs and imaging studies  Micro Results No results found for this or any previous visit (from the past 240 hour(s)).  Radiology Reports Dg Chest 2 View  Result Date: 12/10/2016 CLINICAL DATA:  One week history of asthmatic symptoms including shortness of breath. History of asthma -COPD. Former smoker. EXAM: CHEST  2 VIEW COMPARISON:  PA and lateral chest x-ray of November 13, 2016 FINDINGS: The lungs are adequately inflated. There are slightly increased lung markings overlying the lower thoracic  spine not clearly evident on the frontal view. There is no pleural effusion. The heart and pulmonary vascularity are normal. The trachea is midline. IMPRESSION: Chronic bronchitic-reactive airway changes. Atelectasis or early pneumonia posteriorly likely in the right lower lobe. Followup PA and lateral chest X-ray  is recommended in 3-4 weeks following trial of antibiotic therapy to ensure resolution and exclude underlying malignancy. Electronically Signed   By: David  Martinique M.D.   On: 12/10/2016 14:37   Dg Chest 2 View  Result Date: 11/13/2016 CLINICAL DATA:  Asthma exacerbation. EXAM: CHEST  2 VIEW COMPARISON:  10/18/2016 FINDINGS: The cardiomediastinal silhouette is within normal limits. The lungs are well inflated. Mild peribronchial thickening is similar to the prior study. No confluent airspace opacity, overt pulmonary edema, pleural effusion, or pneumothorax is identified. No acute osseous abnormality is seen. IMPRESSION: Mild chronic peribronchial thickening which may reflect reactive airways disease. No consolidation to suggest pneumonia. Electronically Signed   By: Logan Bores M.D.   On: 11/13/2016 16:28    Lab Data:  CBC:  Recent Labs Lab 12/10/16 1545 12/11/16 0445  WBC 14.8* 14.1*  NEUTROABS 9.9*  --   HGB 11.7* 11.7*  HCT 38.3 37.5  MCV 84.7 85.0  PLT 318 916   Basic Metabolic Panel:  Recent Labs Lab 12/10/16 1545 12/11/16 0445  NA 140 139  K 2.9* 4.0  CL 105 104  CO2 27 27  GLUCOSE 126* 160*  BUN 6 6  CREATININE 0.82 0.76  CALCIUM 8.1* 8.5*   GFR: Estimated Creatinine Clearance: 133.3 mL/min (by C-G formula based on SCr of 0.76 mg/dL). Liver Function Tests: No results for input(s): AST, ALT, ALKPHOS, BILITOT, PROT, ALBUMIN in the last 168 hours. No results for input(s): LIPASE, AMYLASE in the last 168 hours. No results for input(s): AMMONIA in the last 168 hours. Coagulation Profile: No results for input(s): INR, PROTIME in the last 168 hours. Cardiac  Enzymes: No results for input(s): CKTOTAL, CKMB, CKMBINDEX, TROPONINI in the last 168 hours. BNP (last 3 results) No results for input(s): PROBNP in the last 8760 hours. HbA1C: No results for input(s): HGBA1C in the last 72 hours. CBG: No results for input(s): GLUCAP in the last 168 hours. Lipid Profile: No results for input(s): CHOL, HDL, LDLCALC, TRIG, CHOLHDL, LDLDIRECT in the last 72 hours. Thyroid Function Tests: No results for input(s): TSH, T4TOTAL, FREET4, T3FREE, THYROIDAB in the last 72 hours. Anemia Panel: No results for input(s): VITAMINB12, FOLATE, FERRITIN, TIBC, IRON, RETICCTPCT in the last 72 hours. Urine analysis:    Component Value Date/Time   COLORURINE AMBER (A) 11/13/2016 1830   APPEARANCEUR HAZY (A) 11/13/2016 1830   LABSPEC 1.023 11/13/2016 1830   PHURINE 5.0 11/13/2016 1830   GLUCOSEU NEGATIVE 11/13/2016 1830   GLUCOSEU NEG mg/dL 10/28/2007 2049   HGBUR NEGATIVE 11/13/2016 1830   BILIRUBINUR SMALL (A) 11/13/2016 1830   KETONESUR NEGATIVE 11/13/2016 1830   PROTEINUR 30 (A) 11/13/2016 1830   UROBILINOGEN 1.0 11/21/2014 0707   NITRITE NEGATIVE 11/13/2016 1830   LEUKOCYTESUR NEGATIVE 11/13/2016 1830     Chariti Havel M.D. [facility] 12/11/2016, 2:07 PM  Pager: [phone] Between 7am to 7pm - call Pager - [phone]  After 7pm go to [email or web address] - password [email or web address]  Call night coverage person covering after 7pm

## 2016-12-11 NOTE — Care Management Note (Signed)
Case Management Note  Patient Details  Name: April [name] MRN: [id number] Date of Birth: 10/02/1970  Subjective/Objective:                Patient from home alone. History significant of  asthma history of recurrent asthma exacerbations, followed by Dr. Annamaria Boots pulmonologist. Patient admitted with increasing shortness of breath for the last 1 week with associated increase in wheezing. She was prescribed prednisone as an outpatient which hasn't helped much. She does not have oxygen at home. She had an episode of asthma exacerbation a month ago. Patient has had 5 hospitalizations in the past 5 months. Patient follows at the [facility] through the [facility] and has also been referred to [facility] through [facility]) prior to admission. Per notes there is significant difficulties reaching patient, patient not returning calls to schedule appointments.    Action/Plan:  CM will continue to follow for DC planning.   Expected Discharge Date:                  Expected Discharge Plan:  [place]  In-House Referral:     Discharge planning Services  CM Consult  Post Acute Care Choice:    Choice offered to:     DME Arranged:    DME Agency:     HH Arranged:    [place] Agency:     Status of Service:  In process, will continue to follow  If discussed at Long Length of Stay Meetings, dates discussed:    Additional Comments:  Carles Collet, RN 12/11/2016, 4:06 PM

## 2016-12-12 LAB — GLUCOSE, CAPILLARY
GLUCOSE-CAPILLARY: 174 mg/dL — AB (ref 65–99)
Glucose-Capillary: 153 mg/dL — ABNORMAL HIGH (ref 65–99)
Glucose-Capillary: 188 mg/dL — ABNORMAL HIGH (ref 65–99)
Glucose-Capillary: 97 mg/dL (ref 65–99)

## 2016-12-12 LAB — BASIC METABOLIC PANEL
Anion gap: 9 (ref 5–15)
BUN: 13 mg/dL (ref 6–20)
CALCIUM: 8.7 mg/dL — AB (ref 8.9–10.3)
CO2: 25 mmol/L (ref 22–32)
CREATININE: 0.83 mg/dL (ref 0.44–1.00)
Chloride: 102 mmol/L (ref 101–111)
GFR calc Af Amer: >60 mL/min (ref >60)
GLUCOSE: 182 mg/dL — AB (ref 65–99)
POTASSIUM: 4.2 mmol/L (ref 3.5–5.1)
SODIUM: 136 mmol/L (ref 135–145)

## 2016-12-12 LAB — HEMOGLOBIN A1C
HEMOGLOBIN A1C: 5.8 % — AB (ref 4.8–5.6)
Mean Plasma Glucose: 120 mg/dL

## 2016-12-12 MED ORDER — BENZONATATE 100 MG PO CAPS
200.0000 mg | ORAL_CAPSULE | Freq: Three times a day (TID) | ORAL | Status: DC | PRN
  Administered 2016-12-12: 200 mg via ORAL
  Filled 2016-12-12: qty 2

## 2016-12-12 MED ORDER — IPRATROPIUM-ALBUTEROL 0.5-2.5 (3) MG/3ML IN SOLN
3.0000 mL | RESPIRATORY_TRACT | Status: DC
  Administered 2016-12-12 – 2016-12-13 (×4): 3 mL via RESPIRATORY_TRACT
  Filled 2016-12-12 (×4): qty 3

## 2016-12-12 MED ORDER — ALBUTEROL SULFATE (2.5 MG/3ML) 0.083% IN NEBU: 2.5000 mg | INHALATION_SOLUTION | RESPIRATORY_TRACT | Status: DC | PRN

## 2016-12-12 MED ORDER — WHITE PETROLATUM GEL
Status: AC
  Filled 2016-12-12: qty 1

## 2016-12-12 MED ORDER — ARFORMOTEROL TARTRATE 15 MCG/2ML IN NEBU
15.0000 ug | INHALATION_SOLUTION | Freq: Two times a day (BID) | RESPIRATORY_TRACT | Status: DC
  Administered 2016-12-12 – 2016-12-13 (×2): 15 ug via RESPIRATORY_TRACT
  Filled 2016-12-12 (×2): qty 2

## 2016-12-12 MED ORDER — BUDESONIDE 0.25 MG/2ML IN SUSP
0.2500 mg | Freq: Two times a day (BID) | RESPIRATORY_TRACT | Status: DC
  Administered 2016-12-12 – 2016-12-13 (×2): 0.25 mg via RESPIRATORY_TRACT
  Filled 2016-12-12 (×2): qty 2

## 2016-12-12 NOTE — Progress Notes (Signed)
[facility]                                                                              Patient Demographics  April [name], is a 46 y.o. female, DOB - [birth year], PFX:[id number]  Admit date - 12/10/2016   Admitting Physician [name], MD  Outpatient Primary MD for the patient is Boykin Nearing, MD  Outpatient specialists:   LOS - 2  days   Medical records reviewed and are as summarized below:    Chief Complaint  Patient presents with  . Asthma       Brief summary   April [name] is a 46 y.o. female with medical history significant of  asthma history of recurrent asthma exacerbations, followed by Dr. Annamaria Boots pulmonologist. Patient admitted with increasing shortness of breath for the last 1 week with associated increase in wheezing. She was prescribed prednisone as an outpatient which hasn't helped much. She denied denies fever chills cough, nausea, vomiting, diarrhea. She does not have oxygen at home. She had an episode of asthma exacerbation a month ago. She gets Omalizumab injection every other week from her pulmonologist. She denies any chest pain.Chest x-ray showed possible right lower lobe pneumonia. The patient was admitted for further workup.    Assessment & Plan    Active Problems:   Asthma exacerbation in COPD ([place]) - Still somewhat wheezing, continue IV Solu-Medrol, scheduled nebs q 4hrs , discontinued dulera - Placed on Brovana, Pulmicort - Continue Augmentin, home O2 evaluation prior to discharge tomorrow - Continue nicotine patch  Right lower lobe pneumonia  - Continue Augmentin, outpatient follow-up with pulmonology with Dr. Annamaria Boots -  repeat chest x-ray in 4-6 weeks to ensure complete resolution of pneumonia   hypokalemia  -  resolved   History of grade 1 diastolic dysfunction/CHF - 2-D echo in 6/18 showed EF of 55-60% with grade 1 diastolic dysfunction - Continue Lasix, spironolactone, currently euvolemic  Diabetes mellitus -  Placed on sliding scale insulin, hold metformin  - Hemoglobin A1c 5.8  Tobacco abuse -Counseled on tobacco cessation, placed on nicotine patch  Hypertension - Continue amlodipine, losartan, added low-dose metoprolol   Code Status: full  DVT Prophylaxis:  Lovenox  Family Communication: Discussed in detail with the patient, all imaging results, lab results explained to the patient    Disposition Plan : Hopefully in a.m.   Time Spent in minutes   25  minutes  Procedures:    Consultants:     Antimicrobials:    Augmentin 8/14    Medications  Scheduled Meds: . amLODipine  10 mg Oral Daily  . amoxicillin-clavulanate  1 tablet Oral Q12H  . arformoterol  15 mcg Nebulization BID  . budesonide (PULMICORT) nebulizer solution  0.25 mg Nebulization BID  . enoxaparin (LOVENOX) injection  40 mg Subcutaneous Q24H  . fluticasone  2 spray Each Nare Daily  . furosemide  20 mg Oral Daily  . guaiFENesin  600 mg Oral BID  . insulin aspart  0-15 Units Subcutaneous TID WC  . insulin aspart  0-5 Units Subcutaneous QHS  . ipratropium-albuterol  3 mL Nebulization Q6H  . lactobacillus  1 g Oral TID WC  . loratadine  10 mg Oral Daily  . losartan  100 mg Oral Daily  . methylPREDNISolone (SOLU-MEDROL) injection  60 mg Intravenous Q6H  . metoprolol tartrate  12.5 mg Oral BID  . multivitamin with minerals  1 tablet Oral Daily  . sertraline  50 mg Oral Daily  . spironolactone  50 mg Oral Daily  . white petrolatum       Continuous Infusions: PRN Meds:.albuterol, benzonatate, methocarbamol, nicotine, oxyCODONE-acetaminophen, traZODone, zolpidem   Antibiotics   Anti-infectives    Start     Dose/Rate Route Frequency Ordered Stop   12/10/16 2200  amoxicillin-clavulanate (AUGMENTIN) 875-125 MG per tablet 1 tablet  Status:  Discontinued     1 tablet Oral Every 12 hours 12/10/16 1853 12/10/16 1907   12/10/16 1915  amoxicillin-clavulanate (AUGMENTIN) 875-125 MG per tablet 1 tablet     1 tablet  Oral Every 12 hours 12/10/16 1831          Subjective:   April [name] was seen and examined today. Somewhat still wheezing, no fevers or chills. No coughing.  Patient denies dizziness, chest pain, abdominal pain, N/V/D/C, new weakness, numbess, tingling. No acute events overnight.    Objective:   Vitals:   12/12/16 0843 12/12/16 0844 12/12/16 0845 12/12/16 0930  BP:    (!) 147/71  Pulse:   72 83  Resp:    20  Temp:    98.4 F (36.9 C)  TempSrc:    Oral  SpO2: 98% 98% 98% 98%  Weight:      Height:        Intake/Output Summary (Last 24 hours) at 12/12/16 1530 Last data filed at 12/12/16 1011  Gross per 24 hour  Intake              180 ml  Output                0 ml  Net              180 ml     Wt Readings from Last 3 Encounters:  12/11/16 (!) 148.1 kg (326 lb 8 oz)  11/14/16 (!) 154 kg (339 lb 8 oz)  10/27/16 (!) 149.7 kg (330 lb)     Exam   General: Alert and oriented x 3, NAD  Eyes:   HEENT:  Atraumatic, normocephalic  Cardiovascular: S1 S2 auscultated, no rubs, murmurs or gallops. Regular rate and rhythm. No pedal edema b/l  Respiratory: Expiratory wheezing bilaterally  Gastrointestinal: Soft, nontender, nondistended, + bowel sounds  Ext: no pedal edema bilaterally  Neuro: no new deficits   Musculoskeletal: No digital cyanosis, clubbing  Skin: No rashes  Psych: Normal affect and demeanor, alert and oriented x3   Data Reviewed:  I have personally reviewed following labs and imaging studies  Micro Results No results found for this or any previous visit (from the past 240 hour(s)).  Radiology Reports Dg Chest 2 View  Result Date: 12/10/2016 CLINICAL DATA:  One week history of asthmatic symptoms including shortness of breath. History of asthma -COPD. Former smoker. EXAM: CHEST  2 VIEW COMPARISON:  PA and lateral chest x-ray of November 13, 2016 FINDINGS: The lungs are adequately inflated. There are slightly increased lung markings overlying the  lower thoracic spine not clearly evident on the frontal view. There is no pleural effusion. The heart and pulmonary vascularity are normal. The trachea is midline. IMPRESSION: Chronic bronchitic-reactive airway changes. Atelectasis or early pneumonia posteriorly likely  in the right lower lobe. Followup PA and lateral chest X-ray is recommended in 3-4 weeks following trial of antibiotic therapy to ensure resolution and exclude underlying malignancy. Electronically Signed   By: David  Martinique M.D.   On: 12/10/2016 14:37   Dg Chest 2 View  Result Date: 11/13/2016 CLINICAL DATA:  Asthma exacerbation. EXAM: CHEST  2 VIEW COMPARISON:  10/18/2016 FINDINGS: The cardiomediastinal silhouette is within normal limits. The lungs are well inflated. Mild peribronchial thickening is similar to the prior study. No confluent airspace opacity, overt pulmonary edema, pleural effusion, or pneumothorax is identified. No acute osseous abnormality is seen. IMPRESSION: Mild chronic peribronchial thickening which may reflect reactive airways disease. No consolidation to suggest pneumonia. Electronically Signed   By: Logan Bores M.D.   On: 11/13/2016 16:28    Lab Data:  CBC:  Recent Labs Lab 12/10/16 1545 12/11/16 0445  WBC 14.8* 14.1*  NEUTROABS 9.9*  --   HGB 11.7* 11.7*  HCT 38.3 37.5  MCV 84.7 85.0  PLT 318 244   Basic Metabolic Panel:  Recent Labs Lab 12/10/16 1545 12/11/16 0445 12/12/16 0227  NA 140 139 136  K 2.9* 4.0 4.2  CL 105 104 102  CO2 27 27 25   GLUCOSE 126* 160* 182*  BUN 6 6 13   CREATININE 0.82 0.76 0.83  CALCIUM 8.1* 8.5* 8.7*   GFR: Estimated Creatinine Clearance: 129.4 mL/min (by C-G formula based on SCr of 0.83 mg/dL). Liver Function Tests: No results for input(s): AST, ALT, ALKPHOS, BILITOT, PROT, ALBUMIN in the last 168 hours. No results for input(s): LIPASE, AMYLASE in the last 168 hours. No results for input(s): AMMONIA in the last 168 hours. Coagulation Profile: No results  for input(s): INR, PROTIME in the last 168 hours. Cardiac Enzymes: No results for input(s): CKTOTAL, CKMB, CKMBINDEX, TROPONINI in the last 168 hours. BNP (last 3 results) No results for input(s): PROBNP in the last 8760 hours. HbA1C:  Recent Labs  12/11/16 1430  HGBA1C 5.8*   CBG:  Recent Labs Lab 12/11/16 1724 12/11/16 2201 12/12/16 0817 12/12/16 1135  GLUCAP 125* 153* 174* 153*   Lipid Profile: No results for input(s): CHOL, HDL, LDLCALC, TRIG, CHOLHDL, LDLDIRECT in the last 72 hours. Thyroid Function Tests: No results for input(s): TSH, T4TOTAL, FREET4, T3FREE, THYROIDAB in the last 72 hours. Anemia Panel: No results for input(s): VITAMINB12, FOLATE, FERRITIN, TIBC, IRON, RETICCTPCT in the last 72 hours. Urine analysis:    Component Value Date/Time   COLORURINE AMBER (A) 11/13/2016 1830   APPEARANCEUR HAZY (A) 11/13/2016 1830   LABSPEC 1.023 11/13/2016 1830   PHURINE 5.0 11/13/2016 1830   GLUCOSEU NEGATIVE 11/13/2016 1830   GLUCOSEU NEG mg/dL 10/28/2007 2049   HGBUR NEGATIVE 11/13/2016 1830   BILIRUBINUR SMALL (A) 11/13/2016 1830   KETONESUR NEGATIVE 11/13/2016 1830   PROTEINUR 30 (A) 11/13/2016 1830   UROBILINOGEN 1.0 11/21/2014 0707   NITRITE NEGATIVE 11/13/2016 1830   LEUKOCYTESUR NEGATIVE 11/13/2016 1830     April [name] M.D. [facility] 12/12/2016, 3:30 PM  Pager: [phone] Between 7am to 7pm - call Pager - [phone]  After 7pm go to [email or web address] - password [email or web address]  Call night coverage person covering after 7pm

## 2016-12-12 NOTE — Hospital Discharge Follow-Up (Signed)
Met with the patient this morning. She is known to [facility] but has not been compliant with keeping appointments and has been difficult to reach by phone. She stated that she has a difficult time sleeping at night and is sleeping during the day when called.  Confirmed her phone #.  She has an appointment at [facility] tomorrow - 12/13/16 @ 0945 and said that she wants to keep that appointment saying that she will call in the morning if she is not able to keep it.  There are no discharge orders at this time and she does not want to re-schedule the appointment. She also said that she will have transportation to the appointment. She has registered with medicaid transportation but understands that she needs to give a 2-3 day notice when scheduling a ride.  She uses [facility] pharmacy and has been able to put her charges on an account to pay off when she is able. She is still waiting for a decision about her disability application as she has no income at this time. She confirmed that she has a nebulizer at home.   She is also requesting PCS. Explained that the application can be completed after she sees Dr Jarold Song at her next appointment.   This CM spoke to Heath Lark, [facility] Liaison who also explained the difficulties that their behavioral health CM had trying to contact the patient, despite numerous attempts.  They will continue to try to reach out to her.   Update provided to [facility], RN CM

## 2016-12-13 ENCOUNTER — Telehealth: Payer: Self-pay | Admitting: Adult Health

## 2016-12-13 ENCOUNTER — Telehealth: Payer: Self-pay

## 2016-12-13 ENCOUNTER — Ambulatory Visit: Payer: Medicaid Other | Attending: Family Medicine | Admitting: Family Medicine

## 2016-12-13 ENCOUNTER — Encounter: Payer: Self-pay | Admitting: Family Medicine

## 2016-12-13 ENCOUNTER — Other Ambulatory Visit: Payer: Self-pay | Admitting: Internal Medicine

## 2016-12-13 VITALS — BP 159/89 | HR 84 | Temp 98.3°F | Ht 66.0 in | Wt 337.6 lb

## 2016-12-13 DIAGNOSIS — J181 Lobar pneumonia, unspecified organism: Secondary | ICD-10-CM

## 2016-12-13 DIAGNOSIS — M25561 Pain in right knee: Secondary | ICD-10-CM | POA: Insufficient documentation

## 2016-12-13 DIAGNOSIS — Z9851 Tubal ligation status: Secondary | ICD-10-CM | POA: Insufficient documentation

## 2016-12-13 DIAGNOSIS — J4541 Moderate persistent asthma with (acute) exacerbation: Secondary | ICD-10-CM | POA: Diagnosis not present

## 2016-12-13 DIAGNOSIS — K649 Unspecified hemorrhoids: Secondary | ICD-10-CM | POA: Insufficient documentation

## 2016-12-13 DIAGNOSIS — F17210 Nicotine dependence, cigarettes, uncomplicated: Secondary | ICD-10-CM | POA: Insufficient documentation

## 2016-12-13 DIAGNOSIS — F330 Major depressive disorder, recurrent, mild: Secondary | ICD-10-CM

## 2016-12-13 DIAGNOSIS — I10 Essential (primary) hypertension: Secondary | ICD-10-CM

## 2016-12-13 DIAGNOSIS — M5090 Cervical disc disorder, unspecified, unspecified cervical region: Secondary | ICD-10-CM

## 2016-12-13 DIAGNOSIS — Z79891 Long term (current) use of opiate analgesic: Secondary | ICD-10-CM | POA: Insufficient documentation

## 2016-12-13 DIAGNOSIS — G8929 Other chronic pain: Secondary | ICD-10-CM | POA: Insufficient documentation

## 2016-12-13 DIAGNOSIS — M25562 Pain in left knee: Secondary | ICD-10-CM

## 2016-12-13 DIAGNOSIS — J449 Chronic obstructive pulmonary disease, unspecified: Secondary | ICD-10-CM

## 2016-12-13 DIAGNOSIS — Z9889 Other specified postprocedural states: Secondary | ICD-10-CM | POA: Insufficient documentation

## 2016-12-13 DIAGNOSIS — J440 Chronic obstructive pulmonary disease with acute lower respiratory infection: Secondary | ICD-10-CM | POA: Insufficient documentation

## 2016-12-13 DIAGNOSIS — Z79899 Other long term (current) drug therapy: Secondary | ICD-10-CM | POA: Insufficient documentation

## 2016-12-13 DIAGNOSIS — K648 Other hemorrhoids: Secondary | ICD-10-CM

## 2016-12-13 DIAGNOSIS — Z7984 Long term (current) use of oral hypoglycemic drugs: Secondary | ICD-10-CM | POA: Insufficient documentation

## 2016-12-13 DIAGNOSIS — F329 Major depressive disorder, single episode, unspecified: Secondary | ICD-10-CM | POA: Insufficient documentation

## 2016-12-13 DIAGNOSIS — E119 Type 2 diabetes mellitus without complications: Secondary | ICD-10-CM | POA: Insufficient documentation

## 2016-12-13 DIAGNOSIS — J189 Pneumonia, unspecified organism: Secondary | ICD-10-CM

## 2016-12-13 LAB — BASIC METABOLIC PANEL
ANION GAP: 8 (ref 5–15)
BUN: 14 mg/dL (ref 6–20)
CHLORIDE: 103 mmol/L (ref 101–111)
CO2: 26 mmol/L (ref 22–32)
Calcium: 8.6 mg/dL — ABNORMAL LOW (ref 8.9–10.3)
Creatinine, Ser: 0.81 mg/dL (ref 0.44–1.00)
GFR calc non Af Amer: >60 mL/min (ref >60)
Glucose, Bld: 147 mg/dL — ABNORMAL HIGH (ref 65–99)
POTASSIUM: 3.7 mmol/L (ref 3.5–5.1)
SODIUM: 137 mmol/L (ref 135–145)

## 2016-12-13 LAB — GLUCOSE, CAPILLARY: GLUCOSE-CAPILLARY: 154 mg/dL — AB (ref 65–99)

## 2016-12-13 MED ORDER — PREDNISONE 20 MG PO TABS
40.0000 mg | ORAL_TABLET | Freq: Every day | ORAL | Status: DC
Start: 2016-12-13 — End: 2016-12-13
  Administered 2016-12-13: 40 mg via ORAL
  Filled 2016-12-13: qty 2

## 2016-12-13 MED ORDER — ALBUTEROL SULFATE HFA 108 (90 BASE) MCG/ACT IN AERS: 2.0000 | INHALATION_SPRAY | Freq: Four times a day (QID) | RESPIRATORY_TRACT | 12 refills | Status: DC | PRN

## 2016-12-13 MED ORDER — BENZONATATE 200 MG PO CAPS: 200.0000 mg | ORAL_CAPSULE | Freq: Three times a day (TID) | ORAL | 0 refills | Status: DC | PRN

## 2016-12-13 MED ORDER — ACETAMINOPHEN-CODEINE #3 300-30 MG PO TABS: 1.0000 | ORAL_TABLET | Freq: Two times a day (BID) | ORAL | 0 refills | Status: DC | PRN

## 2016-12-13 MED ORDER — GUAIFENESIN ER 600 MG PO TB12: 600.0000 mg | ORAL_TABLET | Freq: Two times a day (BID) | ORAL | 0 refills | Status: DC

## 2016-12-13 MED ORDER — FLORANEX PO PACK: 1.0000 g | PACK | Freq: Three times a day (TID) | ORAL | 0 refills | Status: DC

## 2016-12-13 MED ORDER — METOPROLOL TARTRATE 25 MG PO TABS: 12.5000 mg | ORAL_TABLET | Freq: Two times a day (BID) | ORAL | 4 refills | Status: DC

## 2016-12-13 MED ORDER — BUDESONIDE 0.25 MG/2ML IN SUSP: 0.2500 mg | Freq: Two times a day (BID) | RESPIRATORY_TRACT | 12 refills | Status: DC

## 2016-12-13 MED ORDER — LOSARTAN POTASSIUM 100 MG PO TABS: 100.0000 mg | ORAL_TABLET | Freq: Every day | ORAL | 3 refills | Status: DC

## 2016-12-13 MED ORDER — FLUTICASONE PROPIONATE 50 MCG/ACT NA SUSP: 2.0000 | Freq: Every day | NASAL | 6 refills | Status: DC

## 2016-12-13 MED ORDER — AMOXICILLIN-POT CLAVULANATE 875-125 MG PO TABS: 1.0000 | ORAL_TABLET | Freq: Two times a day (BID) | ORAL | 0 refills | Status: DC

## 2016-12-13 MED ORDER — AMLODIPINE BESYLATE 10 MG PO TABS: 10.0000 mg | ORAL_TABLET | Freq: Every day | ORAL | 4 refills | Status: DC

## 2016-12-13 MED ORDER — ARFORMOTEROL TARTRATE 15 MCG/2ML IN NEBU: 15.0000 ug | INHALATION_SOLUTION | Freq: Two times a day (BID) | RESPIRATORY_TRACT | 12 refills | Status: DC

## 2016-12-13 MED ORDER — ALBUTEROL SULFATE (2.5 MG/3ML) 0.083% IN NEBU: 2.5000 mg | INHALATION_SOLUTION | RESPIRATORY_TRACT | 4 refills | Status: DC | PRN

## 2016-12-13 MED ORDER — METFORMIN HCL 500 MG PO TABS: 500.0000 mg | ORAL_TABLET | Freq: Two times a day (BID) | ORAL | 3 refills | Status: DC

## 2016-12-13 MED ORDER — PREDNISONE 10 MG PO TABS: ORAL_TABLET | ORAL | 0 refills | Status: DC

## 2016-12-13 MED FILL — PROAIR HFA 90 MCG INHALER: 108 (90 BAS | 25 days supply | Qty: 9 | Fill #0

## 2016-12-13 MED FILL — LOSARTAN POTASSIUM 100 MG T: 100 | 30 days supply | Qty: 30 | Fill #0

## 2016-12-13 MED FILL — METOPROLOL TARTRATE 25 MG T: 25 | 30 days supply | Qty: 30 | Fill #0

## 2016-12-13 MED FILL — metFORMIN HCL 500 MG TABS: 500 | 30 days supply | Qty: 60 | Fill #0

## 2016-12-13 MED FILL — AMOX-CLAV 875-125 MG TABLET: 875-125 | 7 days supply | Qty: 14 | Fill #0

## 2016-12-13 MED FILL — ALBUTEROL 0.083% INHAL SOLN: (2.5 MG/3ML | 7 days supply | Qty: 90 | Fill #0

## 2016-12-13 MED FILL — AMLODIPINE BESYLATE 10 MG T: 10 | 30 days supply | Qty: 30 | Fill #0

## 2016-12-13 MED FILL — BENZONATATE 100 MG CAPSULE: 100 | 10 days supply | Qty: 60 | Fill #0

## 2016-12-13 MED FILL — predniSONE 10 MG TABS: 10 | 12 days supply | Qty: 30 | Fill #0

## 2016-12-13 NOTE — Telephone Encounter (Signed)
HFU appts need to be in 30 minute slot April [name] has a 30 minute opening next Wednesday 8.22.18 Would recommend [facility] appt to that date/time Thank you

## 2016-12-13 NOTE — Progress Notes (Signed)
Transition care clinic  Hospitalization dates: 12/10/16 through 12/13/16   Subjective:  Patient ID: April [name], female    DOB: December 14, 1970  Age: 46 y.o. MRN: [id number]  CC: Hospitalization Follow-up   HPI April [name] is a 46 year old female with a history of morbid obesity, major depressive disorder, hypertension, type 2 diabetes mellitus (A1c 5.8), asthma (receiving receiving Omalizumab injections from Pulmonary) hospitalized for acute asthma exacerbation, right lung pneumonia.   She had presented to the ED with dyspnea, wheezing, chest tightness and failed outpatient treatment with prednisone.  Labs revealed hypokalemia of 2.9 which was replaced. Chest x-ray revealed chronic bronchitic reactive airway changes, early pneumonia posteriorly likely in the right lower lobe. She was treated with IV Solu-Medrol, nebulizers, Augmentin and a prednisone taper with improvement in her symptoms . Subsequently discharged to follow up with pulmonary.  She walked into the clinic today right after discharge from the hospital. She complains of pain in her knees and her low back and also pain at the site of her hemorrhoid surgery. Sometimes she ambulates with a walker due to her knee pain and is requesting PCS services.  She received Percocet from her surgeon which she has run out of and is requesting pain medications. She is currently trying to apply for disability as she has not been able to maintain a job due to frequent asthma flares and hospitalizations.  Past Medical History:  Diagnosis Date  . Acanthosis nigricans   . Arthritis   . Asthma    Followed by Dr. Annamaria Boots (pulmonology); receives every other week omalizumab injections; has frequent exacerbations  . COPD (chronic obstructive pulmonary disease) ([place])    PFTs in 2002, FEV1/FVC 65, no post bronchodilater test done  . Depression   . GERD (gastroesophageal reflux disease)   . Headache(784.0)   . Helicobacter pylori (H. pylori)  infection   . Hypertension, essential   . Insomnia   . Menorrhagia   . Morbid obesity ([place])   . Obesity   . Seasonal allergies   . Shortness of breath   . Sleep apnea    Sleep study 2008 - mild OSA, not enough events to titrate CPAP  . Tobacco user     Past Surgical History:  Procedure Laterality Date  . BREAST REDUCTION SURGERY  09/2011  . TUBAL LIGATION  1996   bilateral    No Known Allergies    Outpatient Medications Prior to Visit  Medication Sig Dispense Refill  . albuterol (PROAIR HFA) 108 (90 Base) MCG/ACT inhaler Inhale 2 puffs into the lungs every 6 (six) hours as needed for wheezing or shortness of breath. 1 Inhaler 12  . albuterol (PROVENTIL) (2.5 MG/3ML) 0.083% nebulizer solution Take 3 mLs (2.5 mg total) by nebulization every 4 (four) hours as needed for wheezing or shortness of breath. 150 mL 4  . amLODipine (NORVASC) 10 MG tablet Take 1 tablet (10 mg total) by mouth daily. 30 tablet 4  . amoxicillin-clavulanate (AUGMENTIN) 875-125 MG tablet Take 1 tablet by mouth 2 (two) times daily. X 7 days 14 tablet 0  . arformoterol (BROVANA) 15 MCG/2ML NEBU Take 2 mLs (15 mcg total) by nebulization 2 (two) times daily. 120 mL 12  . benzonatate (TESSALON) 200 MG capsule Take 1 capsule (200 mg total) by mouth 3 (three) times daily as needed for cough. 30 capsule 0  . budesonide (PULMICORT) 0.25 MG/2ML nebulizer solution Take 2 mLs (0.25 mg total) by nebulization 2 (two) times daily. 60 mL 12  .  diphenhydramine-acetaminophen (TYLENOL PM) 25-500 MG TABS tablet Take 1 tablet by mouth at bedtime as needed (for sleep).     . fluticasone (FLONASE) 50 MCG/ACT nasal spray Place 2 sprays into both nostrils daily. 16 g 6  . furosemide (LASIX) 20 MG tablet Take 1 tablet (20 mg total) by mouth daily. 30 tablet 1  . guaiFENesin (MUCINEX) 600 MG 12 hr tablet Take 1 tablet (600 mg total) by mouth 2 (two) times daily. 30 tablet 0  . lactobacillus (FLORANEX/LACTINEX) PACK Take 1 packet (1 g  total) by mouth 3 (three) times daily with meals. 30 packet 0  . loratadine (CLARITIN) 10 MG tablet Take 1 tablet (10 mg total) by mouth daily. 90 tablet 3  . losartan (COZAAR) 100 MG tablet Take 1 tablet (100 mg total) by mouth daily. 30 tablet 3  . methocarbamol (ROBAXIN) 500 MG tablet Take 1 tablet (500 mg total) by mouth 2 (two) times daily. (Patient taking differently: Take 500 mg by mouth 2 (two) times daily as needed for muscle spasms. ) 60 tablet 1  . metoprolol tartrate (LOPRESSOR) 25 MG tablet Take 0.5 tablets (12.5 mg total) by mouth 2 (two) times daily. 60 tablet 4  . Multiple Vitamin (MULTIVITAMIN WITH MINERALS) TABS tablet Take 1 tablet by mouth daily. One-A-Day    . nicotine (NICODERM CQ - DOSED IN MG/24 HOURS) 14 mg/24hr patch Place 1 patch (14 mg total) onto the skin daily. (Patient taking differently: Place 14 mg onto the skin daily as needed (smoking cessation). ) 28 patch 0  . omeprazole (PRILOSEC) 20 MG capsule Take 1 capsule (20 mg total) by mouth daily. (Patient taking differently: Take 20 mg by mouth 2 (two) times daily. ) 30 capsule 11  . predniSONE (DELTASONE) 10 MG tablet Prednisone dosing: Take  Prednisone 40mg  (4 tabs) x 3 days, then taper to 30mg  (3 tabs) x 3 days, then 20mg  (2 tabs) x 3days, then 10mg  (1 tab) x 3days, then OFF. 30 tablet 0  . senna-docusate (SENOKOT-S) 8.6-50 MG tablet Start 1 tab QHS, up to 2tabs BID. Max 4 tabs daily for constipation (Patient taking differently: Take 1-2 tablets by mouth daily as needed for mild constipation. ) 15 tablet 0  . sertraline (ZOLOFT) 50 MG tablet TAKE 1 TABLET BY MOUTH DAILY (Patient taking differently: Take 50 mg by mouth once a day) 90 tablet 0  . spironolactone (ALDACTONE) 50 MG tablet Take 1 tablet (50 mg total) by mouth daily. 60 tablet 2  . traZODone (DESYREL) 50 MG tablet Take 0.5-1 tablets (25-50 mg total) by mouth at bedtime as needed for sleep. (Patient taking differently: Take 50 mg by mouth at bedtime. ) 30 tablet  3  . metFORMIN (GLUCOPHAGE) 500 MG tablet Take 1 tablet (500 mg total) by mouth 2 (two) times daily with a meal. 60 tablet 0  . oxyCODONE-acetaminophen (PERCOCET) 5-325 MG tablet Take 1 tablet by mouth every 4 (four) hours as needed. (Patient taking differently: Take 1 tablet by mouth every 4 (four) hours as needed (for pain). ) 13 tablet 0  . zolpidem (AMBIEN) 10 MG tablet Take 1 tablet (10 mg total) by mouth at bedtime as needed for sleep. 30 tablet 5   No facility-administered medications prior to visit.     ROS Review of Systems Constitutional: Negative for activity change, appetite change and fatigue.  HENT: Negative for congestion, sinus pressure and sore throat.   Eyes: Negative for visual disturbance.  Respiratory:. Negative for chest tightness, Cough.  Cardiovascular: Negative for chest pain and palpitations.  Gastrointestinal: Negative for abdominal distention, abdominal pain and constipation.  Endocrine: Negative for polydipsia.  Genitourinary: Negative for dysuria and frequency.  Musculoskeletal:       See history of present illness  Skin: Negative for rash.  Neurological: Negative for tremors, light-headedness and numbness.  Hematological: Does not bruise/bleed easily.  Psychiatric/Behavioral: Negative for agitation and behavioral problems.  Objective:  BP (!) 159/89   Pulse 84   Temp 98.3 F (36.8 C) (Oral)   Ht 5\' 6"  (1.676 m)   Wt (!) 337 lb 9.6 oz (153.1 kg)   LMP 10/26/2016   SpO2 98%   BMI 54.49 kg/m   BP/Weight 12/13/2016 12/13/2016 10/23/9483  Systolic BP 462 703 -  Diastolic BP 89 72 -  Wt. (Lbs) 337.6 - 336.2  BMI 54.49 - -      Physical Exam Constitutional: She is oriented to person, place, and time. She appears well-developed and well-nourished. No distress.  Neck: No JVD present.  Cardiovascular: Normal rate, normal heart sounds and intact distal pulses.   No murmur heard. Pulmonary/Chest: Effort normal. No accessory muscle usage. Normal  breath sounds, no wheezing  Abdominal: Soft. Bowel sounds are normal. She exhibits no distension and no mass. There is no tenderness.  Musculoskeletal: Normal range of motion. She exhibits tenderness (tenderness on palpation of the medial aspect of left knee and on flexion-extension).  Neurological: She is alert and oriented to person, place, and time.  Skin: Skin is warm and dry.  Psychiatric: She has a normal mood and affect.    CMP Latest Ref Rng & Units 12/13/2016 12/12/2016 12/11/2016  Glucose 65 - 99 mg/dL 147(H) 182(H) 160(H)  BUN 6 - 20 mg/dL 14 13 6   Creatinine 0.44 - 1.00 mg/dL 0.81 0.83 0.76  Sodium 135 - 145 mmol/L 137 136 139  Potassium 3.5 - 5.1 mmol/L 3.7 4.2 4.0  Chloride 101 - 111 mmol/L 103 102 104  CO2 22 - 32 mmol/L 26 25 27   Calcium 8.9 - 10.3 mg/dL 8.6(L) 8.7(L) 8.5(L)  Total Protein 6.5 - 8.1 g/dL - - -  Total Bilirubin 0.3 - 1.2 mg/dL - - -  Alkaline Phos 38 - 126 U/L - - -  AST 15 - 41 U/L - - -  ALT 14 - 54 U/L - - -    Assessment & Plan:   1. Moderate persistent asthma with (acute) exacerbation Improved Continue medications Keep appointment with Pulmonary  2. Chronic pain of both knees Received Steroid shots in the past Will complete form for PCS services  3. Cervical back pain with evidence of disc disease Continue Robaxin - acetaminophen-codeine (TYLENOL #3) 300-30 MG tablet; Take 1 tablet by mouth every 12 (twelve) hours as needed for moderate pain.  Dispense: 30 tablet; Refill: 0  4. Essential hypertension Uncontrolled No regimen changes today as she was just discharged this morning from the hospital [place] blood pressure at next visit for improvement  5. Community acquired pneumonia of right lower lobe of lung ([place]) Complete course of Augmentin Repeat chest x-ray at next visit to evaluate for resolution  6. Other hemorrhoids Status post excision She previously received Percocet from her surgeon Limited supply of  Tylenol No. 3 and other narcotics after this will have to come from surgeon. - acetaminophen-codeine (TYLENOL #3) 300-30 MG tablet; Take 1 tablet by mouth every 12 (twelve) hours as needed for moderate pain.  Dispense: 30 tablet; Refill: 0  7. Type  2 diabetes mellitus without complication, without long-term current use of insulin (HCC) Controlled with A1c of 5.8 - metFORMIN (GLUCOPHAGE) 500 MG tablet; Take 1 tablet (500 mg total) by mouth 2 (two) times daily with a meal.  Dispense: 60 tablet; Refill: 3   Meds ordered this encounter  Medications  . acetaminophen-codeine (TYLENOL #3) 300-30 MG tablet    Sig: Take 1 tablet by mouth every 12 (twelve) hours as needed for moderate pain.    Dispense:  30 tablet    Refill:  0  . metFORMIN (GLUCOPHAGE) 500 MG tablet    Sig: Take 1 tablet (500 mg total) by mouth 2 (two) times daily with a meal.    Dispense:  60 tablet    Refill:  3    Follow-up: Return in about 3 weeks (around 01/03/2017) for TCC - follow-up of asthma exacerbation and pneumonia.   This note has been created with Surveyor, quantity. Any transcriptional errors are unintentional.     Arnoldo Morale MD

## 2016-12-13 NOTE — Patient Instructions (Signed)

## 2016-12-13 NOTE — Telephone Encounter (Signed)
Patient came to her appointment today @ [facility] directly from the hospital.

## 2016-12-13 NOTE — Telephone Encounter (Signed)
Pt rescheduled to 12/18/16 with BO at 9:45 Nothing further needed.

## 2016-12-13 NOTE — Progress Notes (Signed)
Patient discharged to home. Followup appts reviewed. All scripts reviewed and given to the patient. IV removed. All morning medications given as ordered. Patient left unit in stable condition via wheelchair with all belongings in tow.  Sheliah Plane RN

## 2016-12-13 NOTE — Discharge Summary (Signed)
Physician Discharge Summary   Patient ID: April [name] MRN: [id number] DOB/AGE: 05-25-1970 46 y.o.  Admit date: 12/10/2016 Discharge date: 12/13/2016  Primary Care Physician:  Arnoldo Morale, MD  Discharge Diagnoses:    . Acute asthma exacerbation   Right lung pneumonia   Hypokalemia   Grade 1 diastolic CHF, chronic  Diabetes mellitus  Tobacco abuse   Hypertension  Consults:  None    Recommendations for Outpatient Follow-up:  1. Please repeat CBC/BMET at next visit   DIET: Car modified diet   Allergies:  No Known Allergies   DISCHARGE MEDICATIONS: Discharge Medication List as of 12/13/2016  8:56 AM    START taking these medications   Details  amoxicillin-clavulanate (AUGMENTIN) 875-125 MG tablet Take 1 tablet by mouth 2 (two) times daily. X 7 days, Starting Fri 12/13/2016, Print    arformoterol (BROVANA) 15 MCG/2ML NEBU Take 2 mLs (15 mcg total) by nebulization 2 (two) times daily., Starting Fri 12/13/2016, Print    benzonatate (TESSALON) 200 MG capsule Take 1 capsule (200 mg total) by mouth 3 (three) times daily as needed for cough., Starting Fri 12/13/2016, Print    budesonide (PULMICORT) 0.25 MG/2ML nebulizer solution Take 2 mLs (0.25 mg total) by nebulization 2 (two) times daily., Starting Fri 12/13/2016, Print    lactobacillus (FLORANEX/LACTINEX) PACK Take 1 packet (1 g total) by mouth 3 (three) times daily with meals., Starting Fri 12/13/2016, Print    metoprolol tartrate (LOPRESSOR) 25 MG tablet Take 0.5 tablets (12.5 mg total) by mouth 2 (two) times daily., Starting Fri 12/13/2016, Print    predniSONE (DELTASONE) 10 MG tablet Prednisone dosing: Take  Prednisone 40mg  (4 tabs) x 3 days, then taper to 30mg  (3 tabs) x 3 days, then 20mg  (2 tabs) x 3days, then 10mg  (1 tab) x 3days, then OFF., Print      CONTINUE these medications which have CHANGED   Details  albuterol (PROAIR HFA) 108 (90 Base) MCG/ACT inhaler Inhale 2 puffs into the lungs every 6 (six) hours as  needed for wheezing or shortness of breath., Starting Fri 12/13/2016, Print    albuterol (PROVENTIL) (2.5 MG/3ML) 0.083% nebulizer solution Take 3 mLs (2.5 mg total) by nebulization every 4 (four) hours as needed for wheezing or shortness of breath., Starting Fri 12/13/2016, Print    amLODipine (NORVASC) 10 MG tablet Take 1 tablet (10 mg total) by mouth daily., Starting Fri 12/13/2016, Print    fluticasone (FLONASE) 50 MCG/ACT nasal spray Place 2 sprays into both nostrils daily., Starting Fri 12/13/2016, Print    guaiFENesin (MUCINEX) 600 MG 12 hr tablet Take 1 tablet (600 mg total) by mouth 2 (two) times daily., Starting Fri 12/13/2016, Print    losartan (COZAAR) 100 MG tablet Take 1 tablet (100 mg total) by mouth daily., Starting Fri 12/13/2016, Print      CONTINUE these medications which have NOT CHANGED   Details  diphenhydramine-acetaminophen (TYLENOL PM) 25-500 MG TABS tablet Take 1 tablet by mouth at bedtime as needed (for sleep). , Historical Med    furosemide (LASIX) 20 MG tablet Take 1 tablet (20 mg total) by mouth daily., Starting Sat 10/05/2016, Normal    loratadine (CLARITIN) 10 MG tablet Take 1 tablet (10 mg total) by mouth daily., Starting Wed 08/30/2015, Print    methocarbamol (ROBAXIN) 500 MG tablet Take 1 tablet (500 mg total) by mouth 2 (two) times daily., Starting Sun 10/20/2016, Print    Multiple Vitamin (MULTIVITAMIN WITH MINERALS) TABS tablet Take 1 tablet by mouth daily. One-A-Day, Historical  Med    nicotine (NICODERM CQ - DOSED IN MG/24 HOURS) 14 mg/24hr patch Place 1 patch (14 mg total) onto the skin daily., Starting Fri 10/04/2016, Normal    omeprazole (PRILOSEC) 20 MG capsule Take 1 capsule (20 mg total) by mouth daily., Starting Wed 10/09/2016, Normal    senna-docusate (SENOKOT-S) 8.6-50 MG tablet Start 1 tab QHS, up to 2tabs BID. Max 4 tabs daily for constipation, Print    sertraline (ZOLOFT) 50 MG tablet TAKE 1 TABLET BY MOUTH DAILY, Normal    spironolactone  (ALDACTONE) 50 MG tablet Take 1 tablet (50 mg total) by mouth daily., Starting Fri 10/04/2016, Normal    traZODone (DESYREL) 50 MG tablet Take 0.5-1 tablets (25-50 mg total) by mouth at bedtime as needed for sleep., Starting Fri 10/04/2016, Normal    zolpidem (AMBIEN) 10 MG tablet Take 1 tablet (10 mg total) by mouth at bedtime as needed for sleep., Starting Thu 10/10/2016, Until Tue 12/10/2016, Phone In    metFORMIN (GLUCOPHAGE) 500 MG tablet Take 1 tablet (500 mg total) by mouth 2 (two) times daily with a meal., Starting Tue 05/07/2016, Normal    oxyCODONE-acetaminophen (PERCOCET) 5-325 MG tablet Take 1 tablet by mouth every 4 (four) hours as needed., Starting Sun 10/27/2016, Print      STOP taking these medications     DULERA 200-5 MCG/ACT AERO      ipratropium (ATROVENT) 0.02 % nebulizer solution          Brief H and P: For complete details please refer to admission H and P, but in brief April N Beardis a 46 y.o.femalewith medical history significant of asthma history of recurrent asthma exacerbations, followed by Dr. Annamaria Boots pulmonologist.Patient admitted with increasing shortness of breath for the last 1 week with associated increase in wheezing. She was prescribed prednisone as an outpatient which hasn't helped much. She denied denies fever chills cough, nausea, vomiting, diarrhea. She does not have oxygen at home. She had an episode of asthma exacerbation a month ago. She gets Omalizumab injection every other week from her pulmonologist. She denies any chest pain.Chest x-ray showed possible right lower lobe pneumonia. The patient was admitted for further workup.    Hospital Course:  Acute Asthma exacerbation, moderate persistent  - Patient presented wheezing, placed on IV Solu-Medrol, scheduled nebs, initially placed on Dulera  - The patient reported that she had been taking every 2 hours of albuterol at home and dulera had not been helping.  - She was placed on Brovana and Pulmicort  and had significant improvement.  -Placed on Augmentin, nicotine patch and prednisone taper - She has appointment with her pulmonologist on 12/16/16 on Monday. Recommend 6 minute walk test to assess if she needs home O2 set up. Wheezing had significantly improved at the time of discharge. Patient was recommended to stop smoking.  Right lower lobe pneumonia  - Continue Augmentin, outpatient follow-up with pulmonology with Dr. Annamaria Boots scheduled [facility]) on 8/20 -  repeat chest x-ray in 4-6 weeks to ensure complete resolution of pneumonia   hypokalemia  -  resolved   History of grade 1 diastolic dysfunction/CHF - 2-D echo in 6/18 showed EF of 55-60% with grade 1 diastolic dysfunction - Continue Lasix, spironolactone, currently euvolemic  Diabetes mellitus - Placed on sliding scale insulin, while inpatient. Resume metformin at the time of discharge  - Hemoglobin A1c 5.8  Tobacco abuse -Counseled on tobacco cessation, placed on nicotine patch  Hypertension - Continue amlodipine, losartan, added low-dose metoprolol  Morbid obesity BMI 52.2, counseled patient on diet and weight control   Day of Discharge BP (!) 141/72 (BP Location: Left Arm)   Pulse 72   Temp 98.2 F (36.8 C) (Oral)   Resp 19   Ht 5\' 6"  (1.676 m)   Wt (!) 152.5 kg (336 lb 3.2 oz)   LMP 10/10/2016 (Approximate)   SpO2 98%   BMI 54.26 kg/m   Physical Exam: General: Alert and awake oriented x3 not in any acute distress. HEENT: anicteric sclera, pupils reactive to light and accommodation CVS: S1-S2 clear no murmur rubs or gallops Chest: clear to auscultation bilaterally, no wheezing rales or rhonchi Abdomen: obese, soft nontender, nondistended, normal bowel sounds Extremities: no cyanosis, clubbing or edema noted bilaterally Neuro: Cranial nerves II-XII intact, no focal neurological deficits   The results of significant diagnostics from this hospitalization (including imaging, microbiology,  ancillary and laboratory) are listed below for reference.    LAB RESULTS: Basic Metabolic Panel:  Recent Labs Lab 12/12/16 0227 12/13/16 0306  NA 136 137  K 4.2 3.7  CL 102 103  CO2 25 26  GLUCOSE 182* 147*  BUN 13 14  CREATININE 0.83 0.81  CALCIUM 8.7* 8.6*   Liver Function Tests: No results for input(s): AST, ALT, ALKPHOS, BILITOT, PROT, ALBUMIN in the last 168 hours. No results for input(s): LIPASE, AMYLASE in the last 168 hours. No results for input(s): AMMONIA in the last 168 hours. CBC:  Recent Labs Lab 12/10/16 1545 12/11/16 0445  WBC 14.8* 14.1*  NEUTROABS 9.9*  --   HGB 11.7* 11.7*  HCT 38.3 37.5  MCV 84.7 85.0  PLT 318 355   Cardiac Enzymes: No results for input(s): CKTOTAL, CKMB, CKMBINDEX, TROPONINI in the last 168 hours. BNP: Invalid input(s): POCBNP CBG:  Recent Labs Lab 12/12/16 2126 12/13/16 0800  GLUCAP 97 154*    Significant Diagnostic Studies:  Dg Chest 2 View  Result Date: 12/10/2016 CLINICAL DATA:  One week history of asthmatic symptoms including shortness of breath. History of asthma -COPD. Former smoker. EXAM: CHEST  2 VIEW COMPARISON:  PA and lateral chest x-ray of November 13, 2016 FINDINGS: The lungs are adequately inflated. There are slightly increased lung markings overlying the lower thoracic spine not clearly evident on the frontal view. There is no pleural effusion. The heart and pulmonary vascularity are normal. The trachea is midline. IMPRESSION: Chronic bronchitic-reactive airway changes. Atelectasis or early pneumonia posteriorly likely in the right lower lobe. Followup PA and lateral chest X-ray is recommended in 3-4 weeks following trial of antibiotic therapy to ensure resolution and exclude underlying malignancy. Electronically Signed   By: David  Martinique M.D.   On: 12/10/2016 14:37    2D ECHO:   Disposition and Follow-up: Discharge Instructions    Diet Carb Modified    Complete by:  As directed    Increase activity slowly     Complete by:  As directed        DISPOSITION:  Home    DISCHARGE FOLLOW-UP Follow-up Information    [place] on 12/17/2016.   Why:  at 2:00pm for an appointment with Dr Jarold Song in the [place] information: [place] [id number] [phone]       Deneise Lever, MD Follow up on 12/16/2016.   Specialty:  Pulmonary Disease Why:  at 4:00PM for pulm follow-up  Contact information: [place] [address] [phone]  Time spent on Discharge: 38mins   Signed:   Estill Cotta M.D. [facility] 12/13/2016, 11:36 AM Pager: [phone]

## 2016-12-13 NOTE — Telephone Encounter (Signed)
Patient's appointment at [facility] rescheduled to 12/17/16 @ 1400 and the AVS was updated.

## 2016-12-16 ENCOUNTER — Telehealth: Payer: Self-pay

## 2016-12-16 ENCOUNTER — Inpatient Hospital Stay: Payer: Medicaid Other | Admitting: Adult Health

## 2016-12-16 NOTE — Telephone Encounter (Signed)
At her request of Dr Jarold Song , attempted to contact the patient to inform her that she needs to come to the [facility] lab for a BMP, the order has already been placed.  Call placed to # [phone] (H) and a HIPAA compliant voicemail message was left requesting a call back to # [phone].

## 2016-12-16 NOTE — Telephone Encounter (Signed)
PCS form completed.  To Dr Jarold Song for signature

## 2016-12-17 ENCOUNTER — Inpatient Hospital Stay: Payer: Medicaid Other | Admitting: Family Medicine

## 2016-12-17 NOTE — Progress Notes (Deleted)
[facility]   No chief complaint on file.    Primary Pulmonologist: Dr. [name]  Current Outpatient Prescriptions on File Prior to Visit  Medication Sig  . acetaminophen-codeine (TYLENOL #3) 300-30 MG tablet Take 1 tablet by mouth every 12 (twelve) hours as needed for moderate pain.  [name] albuterol (PROAIR HFA) 108 (90 Base) MCG/ACT inhaler Inhale 2 puffs into the lungs every 6 (six) hours as needed for wheezing or shortness of breath.  [name] albuterol (PROVENTIL) (2.5 MG/3ML) 0.083% nebulizer solution Take 3 mLs (2.5 mg total) by nebulization every 4 (four) hours as needed for wheezing or shortness of breath.  [name] amLODipine (NORVASC) 10 MG tablet Take 1 tablet (10 mg total) by mouth daily.  [name] amoxicillin-clavulanate (AUGMENTIN) 875-125 MG tablet Take 1 tablet by mouth 2 (two) times daily. X 7 days  . arformoterol (BROVANA) 15 MCG/2ML NEBU Take 2 mLs (15 mcg total) by nebulization 2 (two) times daily.  . benzonatate (TESSALON) 200 MG capsule Take 1 capsule (200 mg total) by mouth 3 (three) times daily as needed for cough.  . budesonide (PULMICORT) 0.25 MG/2ML nebulizer solution Take 2 mLs (0.25 mg total) by nebulization 2 (two) times daily.  . diphenhydramine-acetaminophen (TYLENOL PM) 25-500 MG TABS tablet Take 1 tablet by mouth at bedtime as needed (for sleep).   . fluticasone (FLONASE) 50 MCG/ACT nasal spray Place 2 sprays into both nostrils daily.  . furosemide (LASIX) 20 MG tablet Take 1 tablet (20 mg total) by mouth daily.  [name] guaiFENesin (MUCINEX) 600 MG 12 hr tablet Take 1 tablet (600 mg total) by mouth 2 (two) times daily.  [name] lactobacillus (FLORANEX/LACTINEX) PACK Take 1 packet (1 g total) by mouth 3 (three) times daily with meals.  [name] loratadine (CLARITIN) 10 MG tablet Take 1 tablet (10 mg total) by mouth daily.  [name] losartan (COZAAR) 100 MG tablet Take 1 tablet (100 mg total) by mouth daily.  . metFORMIN (GLUCOPHAGE) 500 MG tablet Take 1 tablet (500 mg total) by mouth 2 (two) times daily  with a meal.  . methocarbamol (ROBAXIN) 500 MG tablet Take 1 tablet (500 mg total) by mouth 2 (two) times daily. (Patient taking differently: Take 500 mg by mouth 2 (two) times daily as needed for muscle spasms. )  . metoprolol tartrate (LOPRESSOR) 25 MG tablet Take 0.5 tablets (12.5 mg total) by mouth 2 (two) times daily.  . Multiple Vitamin (MULTIVITAMIN WITH MINERALS) TABS tablet Take 1 tablet by mouth daily. One-A-Day  . nicotine (NICODERM CQ - DOSED IN MG/24 HOURS) 14 mg/24hr patch Place 1 patch (14 mg total) onto the skin daily. (Patient taking differently: Place 14 mg onto the skin daily as needed (smoking cessation). )  . omeprazole (PRILOSEC) 20 MG capsule Take 1 capsule (20 mg total) by mouth daily. (Patient taking differently: Take 20 mg by mouth 2 (two) times daily. )  . predniSONE (DELTASONE) 10 MG tablet Prednisone dosing: Take  Prednisone 40mg  (4 tabs) x 3 days, then taper to 30mg  (3 tabs) x 3 days, then 20mg  (2 tabs) x 3days, then 10mg  (1 tab) x 3days, then OFF.  [name] senna-docusate (SENOKOT-S) 8.6-50 MG tablet Start 1 tab QHS, up to 2tabs BID. Max 4 tabs daily for constipation (Patient taking differently: Take 1-2 tablets by mouth daily as needed for mild constipation. )  . sertraline (ZOLOFT) 50 MG tablet TAKE 1 TABLET BY MOUTH DAILY (Patient taking differently: Take 50 mg by mouth once a day)  . spironolactone (ALDACTONE) 50 MG tablet Take  1 tablet (50 mg total) by mouth daily.  . traZODone (DESYREL) 50 MG tablet Take 0.5-1 tablets (25-50 mg total) by mouth at bedtime as needed for sleep. (Patient taking differently: Take 50 mg by mouth at bedtime. )  . zolpidem (AMBIEN) 10 MG tablet Take 1 tablet (10 mg total) by mouth at bedtime as needed for sleep.   No current facility-administered medications on file prior to visit.      Studies: Spirometry 07/29/16  >> FEV1 1.53 L (57% predicted) Echocardiogram 10/03/16 >> mild LVH, LVEF 55-60%, no wall motion abnormalities, grade 1 diastolic  dysfunction, normal right ventricular size and function, estimated PASP 70mmHg  Past Medical Hx:  has a past medical history of Acanthosis nigricans; Arthritis; Asthma; COPD (chronic obstructive pulmonary disease) ([place]); Depression; GERD (gastroesophageal reflux disease); Headache(784.0); Helicobacter pylori (H. pylori) infection; Hypertension, essential; Insomnia; Menorrhagia; Morbid obesity ([place]); Obesity; Seasonal allergies; Shortness of breath; Sleep apnea; and Tobacco user.   Past Surgical hx, Allergies, Family hx, Social hx all reviewed.  Vital Signs There were no vitals taken for this visit.  History of Present Illness April [name] is a 46 y.o. female, smoker, with a history of OSA not on CPAP, moderate persistent asthma on Omalizumab / Xolair (every other week) who presented to the pulmonary office for hospital follow up.    The patient has had multiple hospitalizations in the last months (almost monthly with visits to ER in between) -June 6-8, June 22-24, July 17-20 and readmitted 8/14-8/17 with a one week history of increased SOB & wheezing which was not responsive to outpatient prednisone.  There were concerns of possible RLL PNA and she was treated with Augmentin.  She was treated with IV steroids, nebulized bronchodilators ([facility] and switched to brovana + pulmicort while inpatient & discharged on these).  Chart review notes she frequently uses her inhalers/albuterol up to 6 times per day.  She has had intermittent difficulty with transportation and has not been able to take her Xolair.       Physical Exam  General - well developed adult F in no acute distress ENT - No sinus tenderness, no oral exudate, no LAN Cardiac - s1s2 regular, no murmur Chest - even/non-labored, lungs bilaterally ***. No wheeze/rales Back - No focal tenderness Abd - Soft, non-tender Ext - No edema Neuro - Normal strength Skin - No rashes Psych - normal mood, and  behavior   Assessment/Plan  Discussion: 46 y/o F   Moderate Persistent Asthma   Tobacco Abuse   Obstructive Sleep Apnea   Plan: Assess ambulatory O2 need  Repeat CXR (?timing) Ensure PPI, omeprazole  Discuss neb situation > what can she be compliant with ???  There are no Patient Instructions on file for this visit.    [name], NP-C  Pulmonary & Critical Care Office  [phone] 12/17/2016, 1:49 PM

## 2016-12-17 NOTE — Telephone Encounter (Signed)
Ok refill total 6 months 

## 2016-12-17 NOTE — Telephone Encounter (Signed)
Rx request of ambien 10 mg, dispense 30 with 0 refills. Pt last seen at office 07/29/16. Med last filled 10/10/16.  Dr. Annamaria Boots, please advise if it is okay to refill this med for pt.  No Known Allergies   Outpatient Encounter Prescriptions as of 12/13/2016  Medication Sig Note  . acetaminophen-codeine (TYLENOL #3) 300-30 MG tablet Take 1 tablet by mouth every 12 (twelve) hours as needed for moderate pain.   [name] albuterol (PROAIR HFA) 108 (90 Base) MCG/ACT inhaler Inhale 2 puffs into the lungs every 6 (six) hours as needed for wheezing or shortness of breath.   [name] albuterol (PROVENTIL) (2.5 MG/3ML) 0.083% nebulizer solution Take 3 mLs (2.5 mg total) by nebulization every 4 (four) hours as needed for wheezing or shortness of breath.   [name] amLODipine (NORVASC) 10 MG tablet Take 1 tablet (10 mg total) by mouth daily.   [name] amoxicillin-clavulanate (AUGMENTIN) 875-125 MG tablet Take 1 tablet by mouth 2 (two) times daily. X 7 days   . arformoterol (BROVANA) 15 MCG/2ML NEBU Take 2 mLs (15 mcg total) by nebulization 2 (two) times daily.   . benzonatate (TESSALON) 200 MG capsule Take 1 capsule (200 mg total) by mouth 3 (three) times daily as needed for cough.   . budesonide (PULMICORT) 0.25 MG/2ML nebulizer solution Take 2 mLs (0.25 mg total) by nebulization 2 (two) times daily.   . diphenhydramine-acetaminophen (TYLENOL PM) 25-500 MG TABS tablet Take 1 tablet by mouth at bedtime as needed (for sleep).    . fluticasone (FLONASE) 50 MCG/ACT nasal spray Place 2 sprays into both nostrils daily.   . furosemide (LASIX) 20 MG tablet Take 1 tablet (20 mg total) by mouth daily.   [name] guaiFENesin (MUCINEX) 600 MG 12 hr tablet Take 1 tablet (600 mg total) by mouth 2 (two) times daily.   [name] lactobacillus (FLORANEX/LACTINEX) PACK Take 1 packet (1 g total) by mouth 3 (three) times daily with meals.   [name] loratadine (CLARITIN) 10 MG tablet Take 1 tablet (10 mg total) by mouth daily.   [name] losartan (COZAAR) 100 MG tablet Take 1 tablet  (100 mg total) by mouth daily.   . metFORMIN (GLUCOPHAGE) 500 MG tablet Take 1 tablet (500 mg total) by mouth 2 (two) times daily with a meal.   . methocarbamol (ROBAXIN) 500 MG tablet Take 1 tablet (500 mg total) by mouth 2 (two) times daily. (Patient taking differently: Take 500 mg by mouth 2 (two) times daily as needed for muscle spasms. )   . metoprolol tartrate (LOPRESSOR) 25 MG tablet Take 0.5 tablets (12.5 mg total) by mouth 2 (two) times daily.   . Multiple Vitamin (MULTIVITAMIN WITH MINERALS) TABS tablet Take 1 tablet by mouth daily. One-A-Day   . nicotine (NICODERM CQ - DOSED IN MG/24 HOURS) 14 mg/24hr patch Place 1 patch (14 mg total) onto the skin daily. (Patient taking differently: Place 14 mg onto the skin daily as needed (smoking cessation). ) 12/10/2016: Per the patient, she is still wearing the patch that was applied yesterday-  . omeprazole (PRILOSEC) 20 MG capsule Take 1 capsule (20 mg total) by mouth daily. (Patient taking differently: Take 20 mg by mouth 2 (two) times daily. )   . predniSONE (DELTASONE) 10 MG tablet Prednisone dosing: Take  Prednisone 40mg  (4 tabs) x 3 days, then taper to 30mg  (3 tabs) x 3 days, then 20mg  (2 tabs) x 3days, then 10mg  (1 tab) x 3days, then OFF.   [name] senna-docusate (SENOKOT-S) 8.6-50 MG tablet Start 1  tab QHS, up to 2tabs BID. Max 4 tabs daily for constipation (Patient taking differently: Take 1-2 tablets by mouth daily as needed for mild constipation. )   . sertraline (ZOLOFT) 50 MG tablet TAKE 1 TABLET BY MOUTH DAILY (Patient taking differently: Take 50 mg by mouth once a day)   . spironolactone (ALDACTONE) 50 MG tablet Take 1 tablet (50 mg total) by mouth daily.   . traZODone (DESYREL) 50 MG tablet Take 0.5-1 tablets (25-50 mg total) by mouth at bedtime as needed for sleep. (Patient taking differently: Take 50 mg by mouth at bedtime. )    No facility-administered encounter medications on file as of 12/13/2016.

## 2016-12-18 ENCOUNTER — Telehealth: Payer: Self-pay

## 2016-12-18 ENCOUNTER — Emergency Department (HOSPITAL_COMMUNITY): Payer: Medicaid Other

## 2016-12-18 ENCOUNTER — Encounter (HOSPITAL_COMMUNITY): Payer: Self-pay | Admitting: Emergency Medicine

## 2016-12-18 ENCOUNTER — Emergency Department (HOSPITAL_COMMUNITY)
Admission: EM | Admit: 2016-12-18 | Discharge: 2016-12-19 | Disposition: A | Discharge status: Home / Self Care | Payer: Medicaid Other | Attending: Emergency Medicine | Admitting: Emergency Medicine

## 2016-12-18 ENCOUNTER — Other Ambulatory Visit: Payer: Self-pay | Admitting: Surgical Oncology

## 2016-12-18 ENCOUNTER — Inpatient Hospital Stay: Payer: Medicaid Other | Admitting: Pulmonary Disease

## 2016-12-18 DIAGNOSIS — Z794 Long term (current) use of insulin: Secondary | ICD-10-CM | POA: Diagnosis not present

## 2016-12-18 DIAGNOSIS — R51 Headache: Secondary | ICD-10-CM | POA: Diagnosis present

## 2016-12-18 DIAGNOSIS — J45909 Unspecified asthma, uncomplicated: Secondary | ICD-10-CM | POA: Insufficient documentation

## 2016-12-18 DIAGNOSIS — E119 Type 2 diabetes mellitus without complications: Secondary | ICD-10-CM | POA: Insufficient documentation

## 2016-12-18 DIAGNOSIS — R0602 Shortness of breath: Secondary | ICD-10-CM | POA: Diagnosis not present

## 2016-12-18 DIAGNOSIS — Z87891 Personal history of nicotine dependence: Secondary | ICD-10-CM | POA: Insufficient documentation

## 2016-12-18 DIAGNOSIS — K219 Gastro-esophageal reflux disease without esophagitis: Secondary | ICD-10-CM

## 2016-12-18 DIAGNOSIS — J449 Chronic obstructive pulmonary disease, unspecified: Secondary | ICD-10-CM | POA: Insufficient documentation

## 2016-12-18 DIAGNOSIS — R519 Headache, unspecified: Secondary | ICD-10-CM

## 2016-12-18 DIAGNOSIS — Z79899 Other long term (current) drug therapy: Secondary | ICD-10-CM | POA: Insufficient documentation

## 2016-12-18 DIAGNOSIS — R062 Wheezing: Secondary | ICD-10-CM | POA: Diagnosis not present

## 2016-12-18 DIAGNOSIS — I10 Essential (primary) hypertension: Secondary | ICD-10-CM | POA: Diagnosis not present

## 2016-12-18 LAB — CBC WITH DIFFERENTIAL/PLATELET
BASOS ABS: 0 10*3/uL (ref 0.0–0.1)
BASOS PCT: 0 %
Eosinophils Absolute: 0 10*3/uL (ref 0.0–0.7)
Eosinophils Relative: 0 %
HEMATOCRIT: 37.4 % (ref 36.0–46.0)
HEMOGLOBIN: 12 g/dL (ref 12.0–15.0)
Lymphocytes Relative: 13 %
Lymphs Abs: 2.5 10*3/uL (ref 0.7–4.0)
MCH: 27.1 pg (ref 26.0–34.0)
MCHC: 32.1 g/dL (ref 30.0–36.0)
MCV: 84.6 fL (ref 78.0–100.0)
MONO ABS: 1.7 10*3/uL — AB (ref 0.1–1.0)
Monocytes Relative: 9 %
NEUTROS ABS: 14.7 10*3/uL — AB (ref 1.7–7.7)
NEUTROS PCT: 78 %
Platelets: 283 10*3/uL (ref 150–400)
RBC: 4.42 MIL/uL (ref 3.87–5.11)
RDW: 19.7 % — AB (ref 11.5–15.5)
WBC: 18.9 10*3/uL — ABNORMAL HIGH (ref 4.0–10.5)

## 2016-12-18 LAB — COMPREHENSIVE METABOLIC PANEL
ALT: 24 U/L (ref 14–54)
AST: 19 U/L (ref 15–41)
Albumin: 3 g/dL — ABNORMAL LOW (ref 3.5–5.0)
Alkaline Phosphatase: 50 U/L (ref 38–126)
Anion gap: 9 (ref 5–15)
BUN: 12 mg/dL (ref 6–20)
CHLORIDE: 107 mmol/L (ref 101–111)
CO2: 22 mmol/L (ref 22–32)
Calcium: 8.5 mg/dL — ABNORMAL LOW (ref 8.9–10.3)
Creatinine, Ser: 0.75 mg/dL (ref 0.44–1.00)
Glucose, Bld: 126 mg/dL — ABNORMAL HIGH (ref 65–99)
POTASSIUM: 3.5 mmol/L (ref 3.5–5.1)
Sodium: 138 mmol/L (ref 135–145)
Total Bilirubin: 0.3 mg/dL (ref 0.3–1.2)
Total Protein: 5.9 g/dL — ABNORMAL LOW (ref 6.5–8.1)

## 2016-12-18 LAB — I-STAT BETA HCG BLOOD, ED (MC, WL, AP ONLY)

## 2016-12-18 LAB — D-DIMER, QUANTITATIVE: D-Dimer, Quant: <0.27 ug/mL-FEU (ref 0.00–0.50)

## 2016-12-18 MED ORDER — METHYLPREDNISOLONE SODIUM SUCC 125 MG IJ SOLR
125.0000 mg | Freq: Once | INTRAMUSCULAR | Status: AC
  Administered 2016-12-18: 125 mg via INTRAVENOUS
  Filled 2016-12-18: qty 2

## 2016-12-18 MED ORDER — SODIUM CHLORIDE 0.9 % IV BOLUS (SEPSIS)
1000.0000 mL | Freq: Once | INTRAVENOUS | Status: AC
  Administered 2016-12-18: 1000 mL via INTRAVENOUS

## 2016-12-18 MED ORDER — IPRATROPIUM-ALBUTEROL 0.5-2.5 (3) MG/3ML IN SOLN
3.0000 mL | Freq: Once | RESPIRATORY_TRACT | Status: AC
  Administered 2016-12-18: 3 mL via RESPIRATORY_TRACT
  Filled 2016-12-18: qty 3

## 2016-12-18 MED ORDER — MAGNESIUM SULFATE 2 GM/50ML IV SOLN
2.0000 g | Freq: Once | INTRAVENOUS | Status: AC
  Administered 2016-12-18: 2 g via INTRAVENOUS
  Filled 2016-12-18: qty 50

## 2016-12-18 MED ORDER — IPRATROPIUM-ALBUTEROL 0.5-2.5 (3) MG/3ML IN SOLN
RESPIRATORY_TRACT | Status: AC
  Filled 2016-12-18: qty 3

## 2016-12-18 MED ORDER — KETOROLAC TROMETHAMINE 30 MG/ML IJ SOLN
30.0000 mg | Freq: Once | INTRAMUSCULAR | Status: AC
  Administered 2016-12-18: 30 mg via INTRAVENOUS
  Filled 2016-12-18: qty 1

## 2016-12-18 MED ORDER — IPRATROPIUM-ALBUTEROL 0.5-2.5 (3) MG/3ML IN SOLN
3.0000 mL | Freq: Once | RESPIRATORY_TRACT | Status: AC
  Administered 2016-12-18: 3 mL via RESPIRATORY_TRACT

## 2016-12-18 MED ORDER — IPRATROPIUM BROMIDE 0.02 % IN SOLN
0.5000 mg | Freq: Once | RESPIRATORY_TRACT | Status: AC
  Administered 2016-12-18: 0.5 mg via RESPIRATORY_TRACT
  Filled 2016-12-18: qty 2.5

## 2016-12-18 MED ORDER — METOCLOPRAMIDE HCL 5 MG/ML IJ SOLN
10.0000 mg | Freq: Once | INTRAMUSCULAR | Status: AC
  Administered 2016-12-18: 10 mg via INTRAVENOUS
  Filled 2016-12-18: qty 2

## 2016-12-18 MED ORDER — ALBUTEROL (5 MG/ML) CONTINUOUS INHALATION SOLN: 10.0000 mg/h | INHALATION_SOLUTION | Freq: Once | RESPIRATORY_TRACT | Status: DC

## 2016-12-18 NOTE — ED Triage Notes (Signed)
Pt. Stated, Donnald Garre had a headache for 2 days with my headache.  My inhaler is not working.

## 2016-12-18 NOTE — Telephone Encounter (Signed)
Attempted to contact the patient to inform her that Dr Jarold Song would like her to come to the office to have a BMP drawn.  Call placed to # [phone] (M) and a HIPAA compliant voicemail message was left requesting a call back to # [phone]

## 2016-12-18 NOTE — ED Notes (Signed)
Pt refusing to get IV in ACs stating that "I have been stuck there too many times, you'll have to find it somewhere else or call IV team." This RN attempted to explain to pt that she would have to get IV wherever I could find a vein and her AC was actually a good spot . She stated she wanted somebody to come do the IV. Kaitlynne RN @ bedside attempting IV.

## 2016-12-18 NOTE — ED Notes (Signed)
Code Sepsis activated @ 2127

## 2016-12-18 NOTE — ED Notes (Signed)
Pt. Has bilateral expiratory wheezing.

## 2016-12-18 NOTE — ED Notes (Signed)
Patient transported to CT 

## 2016-12-18 NOTE — ED Provider Notes (Signed)
[facility] DEPT Provider Note   CSN: [id number] Arrival date & time: 12/18/16  1841     History   Chief Complaint Chief Complaint  Patient presents with  . Headache  . Asthma    HPI April [name] is a 46 y.o. female.  HPI   April [name] is a 46 y.o. female, with a history of Asthma, morbid obesity, HTN, COPD, presenting to the ED with a headache for the last two days. Pain is constant, around the left side of the face and left frontal region, pounding/aching, 10/10, nonradiating. She has not tried anything for her symptoms.   Also states, "My asthma has been acting up." She endorses increased shortness of breath over the past two days. Was recently hospitalized from 8/14-8/17 for asthma exacerbation and right lower lobe pneumonia. Patient was discharged with Augmentin and prednisone and states she is still taking these medications.  Denies fever/chills, neck pain/stiffness, rash, N/V, vision changes, neuro deficits, falls/trauma, cough, or any other complaints.     Past Medical History:  Diagnosis Date  . Acanthosis nigricans   . Arthritis   . Asthma    Followed by Dr. Annamaria Boots (pulmonology); receives every other week omalizumab injections; has frequent exacerbations  . COPD (chronic obstructive pulmonary disease) ([place])    PFTs in 2002, FEV1/FVC 65, no post bronchodilater test done  . Depression   . GERD (gastroesophageal reflux disease)   . Headache(784.0)   . Helicobacter pylori (H. pylori) infection   . Hypertension, essential   . Insomnia   . Menorrhagia   . Morbid obesity ([place])   . Obesity   . Seasonal allergies   . Shortness of breath   . Sleep apnea    Sleep study 2008 - mild OSA, not enough events to titrate CPAP  . Tobacco user     Patient Active Problem List   Diagnosis Date Noted  . Asthma exacerbation in COPD ([place]) 12/10/2016  . Moderate persistent asthma with (acute) exacerbation 11/13/2016  . Moderate persistent asthma with exacerbation  10/19/2016  . Diarrhea 10/19/2016  . Type 2 diabetes mellitus without complication, without long-term current use of insulin ([place]) 10/02/2016  . Normocytic anemia 10/02/2016  . Respiratory failure ([place]) 10/02/2016  . Hypertensive urgency, malignant 06/22/2016  . Pulmonary edema 06/22/2016  . Hypertensive urgency 06/22/2016  . Malignant hypertension due to primary aldosteronism ([place]) 05/05/2016  . Lip laceration 05/05/2016  . Elevated hemoglobin A1c 05/05/2016  . Asthma exacerbation 11/10/2015  . GERD (gastroesophageal reflux disease) 08/30/2015  . Generalized anxiety disorder 08/30/2015  . Prediabetes 08/22/2015  . Seasonal allergic rhinitis 08/29/2013  . Leukocytosis 10/21/2012  . Tobacco abuse 10/07/2012  . Asthma, chronic obstructive, without status asthmaticus ([place]) 05/07/2012  . Knee pain, bilateral 04/25/2011  . Primary insomnia 03/14/2011  . Mild obstructive sleep apnea 12/19/2010  . Hypokalemia 08/13/2010  . Cervical back pain with evidence of disc disease 04/08/2008  . Essential hypertension 07/31/2006  . Morbid obesity with body mass index of 50.0-59.9 in adult ([place]) 06/17/2006  . Major depressive disorder, recurrent episode ([place]) 04/10/2006    Past Surgical History:  Procedure Laterality Date  . BREAST REDUCTION SURGERY  09/2011  . TUBAL LIGATION  1996   bilateral    OB History    No data available       Home Medications    Prior to Admission medications   Medication Sig Start Date End Date Taking? Authorizing Provider  acetaminophen-codeine (TYLENOL #3) 300-30 MG tablet Take 1 tablet  by mouth every 12 (twelve) hours as needed for moderate pain. 12/13/16  Yes Arnoldo Morale, MD  albuterol (PROAIR HFA) 108 (90 Base) MCG/ACT inhaler Inhale 2 puffs into the lungs every 6 (six) hours as needed for wheezing or shortness of breath. 12/13/16  Yes Rai, Ripudeep K, MD  albuterol (PROVENTIL) (2.5 MG/3ML) 0.083% nebulizer solution Take 3 mLs (2.5 mg total) by nebulization  every 4 (four) hours as needed for wheezing or shortness of breath. 12/13/16  Yes Rai, Ripudeep K, MD  amLODipine (NORVASC) 10 MG tablet Take 1 tablet (10 mg total) by mouth daily. 12/13/16  Yes Rai, Ripudeep K, MD  amoxicillin-clavulanate (AUGMENTIN) 875-125 MG tablet Take 1 tablet by mouth 2 (two) times daily. X 7 days 12/13/16  Yes Rai, Ripudeep K, MD  arformoterol (BROVANA) 15 MCG/2ML NEBU Take 2 mLs (15 mcg total) by nebulization 2 (two) times daily. 12/13/16  Yes Rai, Ripudeep K, MD  benzonatate (TESSALON) 200 MG capsule Take 1 capsule (200 mg total) by mouth 3 (three) times daily as needed for cough. 12/13/16  Yes Rai, Ripudeep K, MD  budesonide (PULMICORT) 0.25 MG/2ML nebulizer solution Take 2 mLs (0.25 mg total) by nebulization 2 (two) times daily. 12/13/16  Yes Rai, Ripudeep K, MD  diphenhydramine-acetaminophen (TYLENOL PM) 25-500 MG TABS tablet Take 1 tablet by mouth at bedtime as needed (for sleep).    Yes [provider]  fluticasone (FLONASE) 50 MCG/ACT nasal spray Place 2 sprays into both nostrils daily. 12/13/16  Yes Rai, Ripudeep K, MD  furosemide (LASIX) 20 MG tablet Take 1 tablet (20 mg total) by mouth daily. 10/05/16  Yes Reyne Dumas, MD  guaiFENesin (MUCINEX) 600 MG 12 hr tablet Take 1 tablet (600 mg total) by mouth 2 (two) times daily. 12/13/16  Yes Rai, Ripudeep K, MD  lactobacillus (FLORANEX/LACTINEX) PACK Take 1 packet (1 g total) by mouth 3 (three) times daily with meals. 12/13/16  Yes Rai, Ripudeep K, MD  loratadine (CLARITIN) 10 MG tablet Take 1 tablet (10 mg total) by mouth daily. 08/30/15  Yes [name], MD  losartan (COZAAR) 100 MG tablet Take 1 tablet (100 mg total) by mouth daily. 12/13/16  Yes Rai, Ripudeep K, MD  metFORMIN (GLUCOPHAGE) 500 MG tablet Take 1 tablet (500 mg total) by mouth 2 (two) times daily with a meal. 12/13/16  Yes Amao, Enobong, MD  methocarbamol (ROBAXIN) 500 MG tablet Take 1 tablet (500 mg total) by mouth 2 (two) times daily. Patient taking  differently: Take 500 mg by mouth 2 (two) times daily as needed for muscle spasms.  10/20/16  Yes Rai, Ripudeep K, MD  metoprolol tartrate (LOPRESSOR) 25 MG tablet Take 0.5 tablets (12.5 mg total) by mouth 2 (two) times daily. 12/13/16  Yes Rai, Ripudeep K, MD  Multiple Vitamin (MULTIVITAMIN WITH MINERALS) TABS tablet Take 1 tablet by mouth daily. One-A-Day   Yes [provider]  nicotine (NICODERM CQ - DOSED IN MG/24 HOURS) 14 mg/24hr patch Place 1 patch (14 mg total) onto the skin daily. Patient taking differently: Place 14 mg onto the skin daily as needed (smoking cessation).  10/04/16  Yes Reyne Dumas, MD  omeprazole (PRILOSEC) 20 MG capsule Take 1 capsule (20 mg total) by mouth daily. Patient taking differently: Take 20 mg by mouth 2 (two) times daily.  10/09/16  Yes Young, Tarri Fuller D, MD  predniSONE (DELTASONE) 10 MG tablet Prednisone dosing: Take  Prednisone 40mg  (4 tabs) x 3 days, then taper to 30mg  (3 tabs) x 3 days, then  20mg  (2 tabs) x 3days, then 10mg  (1 tab) x 3days, then OFF. 12/13/16  Yes Rai, Ripudeep K, MD  senna-docusate (SENOKOT-S) 8.6-50 MG tablet Start 1 tab QHS, up to 2tabs BID. Max 4 tabs daily for constipation Patient taking differently: Take 1-2 tablets by mouth daily as needed for mild constipation.  10/27/16  Yes Pisciotta, Elmyra Ricks, PA-C  sertraline (ZOLOFT) 50 MG tablet TAKE 1 TABLET BY MOUTH DAILY Patient taking differently: Take 50 mg by mouth once a day 11/18/16  Yes Funches, Josalyn, MD  spironolactone (ALDACTONE) 50 MG tablet Take 1 tablet (50 mg total) by mouth daily. 10/04/16  Yes Reyne Dumas, MD  traZODone (DESYREL) 50 MG tablet Take 0.5-1 tablets (25-50 mg total) by mouth at bedtime as needed for sleep. Patient taking differently: Take 50 mg by mouth at bedtime.  10/04/16  Yes Reyne Dumas, MD  zolpidem (AMBIEN) 10 MG tablet TAKE 1 TABLET BY MOUTH EVERY DAY AT BEDTIME AS NEEDED 12/17/16  Yes Young, Tarri Fuller D, MD  butalbital-acetaminophen-caffeine (FIORICET, ESGIC)  [phone] MG tablet Take 1-2 tablets by mouth every 6 (six) hours as needed for headache. 12/19/16 12/19/17  Elberta Lachapelle C, PA-C  metoCLOPramide (REGLAN) 10 MG tablet Take 1 tablet (10 mg total) by mouth every 6 (six) hours. 12/19/16   Lorayne Bender, PA-C    Family History Family History  Problem Relation Age of Onset  . Hypertension Mother   . Asthma Daughter   . Cancer Paternal Aunt   . Asthma Maternal Grandmother     Social History Social History  Substance Use Topics  . Smoking status: Former Smoker    Packs/day: 0.50    Years: 18.00    Types: Cigarettes    Quit date: 04/29/2014  . Smokeless tobacco: Never Used  . Alcohol use No     Allergies   Patient has no known allergies.   Review of Systems Review of Systems  Constitutional: Negative for chills, diaphoresis and fever.  Respiratory: Positive for shortness of breath. Negative for cough.   Cardiovascular: Negative for chest pain.  Gastrointestinal: Negative for abdominal pain, diarrhea, nausea and vomiting.  Musculoskeletal: Negative for neck pain and neck stiffness.  Skin: Negative for rash.  Neurological: Positive for headaches. Negative for dizziness, syncope, weakness, light-headedness and numbness.  All other systems reviewed and are negative.    Physical Exam Updated Vital Signs BP (!) 150/77 (BP Location: Right Arm)   Pulse 91   Temp 99.1 F (37.3 C) (Oral)   Resp 19   Ht 5\' 5"  (1.651 m)   Wt (!) 155.1 kg (342 lb)   LMP 10/26/2016   SpO2 100%   BMI 56.91 kg/m   Physical Exam  Constitutional: She appears well-developed and well-nourished. No distress.  HENT:  Head: Normocephalic and atraumatic.  Eyes: Pupils are equal, round, and reactive to light. Conjunctivae and EOM are normal.  Neck: Normal range of motion. Neck supple.  Cardiovascular: Normal rate, regular rhythm, normal heart sounds and intact distal pulses.   Pulmonary/Chest: Tachypnea noted. She has decreased breath sounds. She has wheezes.   Increased work of breathing, especially with any movement.   Abdominal: Soft. There is no tenderness. There is no guarding.  Musculoskeletal: She exhibits no edema.  Lymphadenopathy:    She has no cervical adenopathy.  Neurological: She is alert.  No sensory deficits. Strength 5/5 in all extremities. No gait disturbance. Coordination intact including heel to shin and finger to nose. Cranial nerves III-XII grossly intact. No facial droop.  Skin: Skin is warm and dry. Capillary refill takes less than 2 seconds. She is not diaphoretic.  Psychiatric: She has a normal mood and affect. Her behavior is normal.  Nursing note and vitals reviewed.    ED Treatments / Results  Labs (all labs ordered are listed, but only abnormal results are displayed) Labs Reviewed  COMPREHENSIVE METABOLIC PANEL - Abnormal; Notable for the following:       Result Value   Glucose, Bld 126 (*)    Calcium 8.5 (*)    Total Protein 5.9 (*)    Albumin 3.0 (*)    All other components within normal limits  CBC WITH DIFFERENTIAL/PLATELET - Abnormal; Notable for the following:    WBC 18.9 (*)    RDW 19.7 (*)    Neutro Abs 14.7 (*)    Monocytes Absolute 1.7 (*)    All other components within normal limits  CULTURE, BLOOD (ROUTINE X 2)  CULTURE, BLOOD (ROUTINE X 2)  D-DIMER, QUANTITATIVE (NOT AT [facility])  MAGNESIUM  I-STAT BETA HCG BLOOD, ED ([facility], [facility], [facility] ONLY)    EKG  EKG Interpretation None       Radiology Dg Chest 2 View  Result Date: 12/18/2016 CLINICAL DATA:  Wheezing EXAM: CHEST  2 VIEW COMPARISON:  12/10/2016 FINDINGS: The heart size and mediastinal contours are within normal limits. Mild increase in interstitial prominence and slight peribronchial thickening seen to better advantage on the lateral view. Findings likely reflect chronic bronchitic change. No alveolar consolidations are noted. Subtle ground-glass appearance of the lungs may reflect an alveolitis or pneumonitis. The visualized skeletal  structures are unremarkable. IMPRESSION: Stable mild diffuse bronchitic change with faint ground-glass appearance of the lungs that may reflect an alveolitis/pneumonitis or possibly some hypoventilatory change of the lungs. Electronically Signed   By: Ashley Royalty M.D.   On: 12/18/2016 23:19   Ct Head Wo Contrast  Result Date: 12/18/2016 CLINICAL DATA:  Acute onset was superior right-sided headache. Worst headache of life. EXAM: CT HEAD WITHOUT CONTRAST TECHNIQUE: Contiguous axial images were obtained from the base of the skull through the vertex without intravenous contrast. COMPARISON:  Head CT 10/20/2016 FINDINGS: Brain: No intracranial hemorrhage, mass effect, or midline shift. No hydrocephalus. The basilar cisterns are patent. No evidence of territorial infarct or acute ischemia. No extra-axial or intracranial fluid collection. Vascular: No hyperdense vessel or unexpected calcification. Skull: Normal. Negative for fracture or focal lesion. Sinuses/Orbits: Paranasal sinuses and mastoid air cells are clear. The visualized orbits are unremarkable. Other: None. IMPRESSION: Unremarkable noncontrast head CT. Electronically Signed   By: Jeb Levering M.D.   On: 12/18/2016 22:50    Procedures Procedures (including critical care time)  Medications Ordered in ED Medications  ipratropium-albuterol (DUONEB) 0.5-2.5 (3) MG/3ML nebulizer solution (  Not Given 12/18/16 2256)  albuterol (PROVENTIL,VENTOLIN) solution continuous neb (10 mg/hr Nebulization Not Given 12/18/16 2340)  ipratropium-albuterol (DUONEB) 0.5-2.5 (3) MG/3ML nebulizer solution 3 mL (3 mLs Nebulization Given 12/18/16 1853)  ipratropium-albuterol (DUONEB) 0.5-2.5 (3) MG/3ML nebulizer solution 3 mL (3 mLs Nebulization Given 12/18/16 2156)  sodium chloride 0.9 % bolus 1,000 mL (0 mLs Intravenous Stopped 12/19/16 0033)  methylPREDNISolone sodium succinate (SOLU-MEDROL) 125 mg/2 mL injection 125 mg (125 mg Intravenous Given 12/18/16 2156)  ketorolac  (TORADOL) 30 MG/ML injection 30 mg (30 mg Intravenous Given 12/18/16 2156)  metoCLOPramide (REGLAN) injection 10 mg (10 mg Intravenous Given 12/18/16 2156)  magnesium sulfate IVPB 2 g 50 mL (0 g Intravenous Stopped 12/19/16 0027)  ipratropium (ATROVENT) nebulizer  solution 0.5 mg (0.5 mg Nebulization Given 12/18/16 2327)     Initial Impression / Assessment and Plan / ED Course  I have reviewed the triage vital signs and the nursing notes.  Pertinent labs & imaging results that were available during my care of the patient were reviewed by me and considered in my medical decision making (see chart for details).  Clinical Course as of Dec 20 34  Wed Dec 18, 2016  2212 Patient states her breathing has improved. Her headache has not yet started to improve.  Lung sounds are still diminished. Inspiratory and expiratory wheezes.  [SJ]  2300 Patient is on prednisone. WBC count was around 14 upon discharge from the hospital. WBC: (!) 18.9 [SJ]  2345 Patient states she feels much better and is ready for discharge. Her headache and shortness of breath have resolved.  [SJ]    Clinical Course User Index [SJ] Crystalle Popwell C, PA-C    Patient presents with asthma exacerbation as well as headache. No neuro or functional deficits noted. Patient is nontoxic appearing, afebrile, not tachycardic, not hypotensive, and maintains SPO2 of 100% on room air. Showed significant improvement in both complaints during ED course. Patient has close follow-up with her pulmonologist already scheduled. PCP follow-up for headache. Patient ambulated without difficulty, assistance, or recurrence of symptoms The patient was given instructions for home care as well as return precautions. Patient voices understanding of these instructions, accepts the plan, and is comfortable with discharge.  Findings and plan of care discussed with Charlesetta Shanks, MD. Dr. Johnney Killian personally evaluated and examined this patient.      From discharge  summary from 8/17 - Patient presented wheezing, placed on IV Solu-Medrol, scheduled nebs, initially placed on Dulera  - The patient reported that she had been taking every 2 hours of albuterol at home and dulera had not been helping.  - She was placed on Brovana and Pulmicort and had significant improvement.  -Placed on Augmentin, nicotine patch and prednisone taper - She has appointment with her pulmonologist on 12/16/16 on Monday. Recommend 6 minute walk test to assess if she needs home O2 set up. Wheezing had significantly improved at the time of discharge. Patient was recommended to stop smoking.   Vitals:   12/18/16 1846 12/18/16 1848 12/18/16 1850 12/18/16 1937  BP: (!) 150/77   (!) 152/79  Pulse: 91   86  Resp: 19     Temp: 99.1 F (37.3 C)     TempSrc: Oral     SpO2: 100%   100%  Weight:   (!) 155.1 kg (342 lb)   Height:  5\' 5"  (1.651 m)      Vitals:   12/18/16 1850 12/18/16 1937 12/18/16 2000 12/18/16 2225  BP:  (!) 152/79 (!) 152/82 (!) 151/96  Pulse:  86 90 81  Resp:    12  Temp:      TempSrc:      SpO2:  100% 100% 100%  Weight: (!) 155.1 kg (342 lb)     Height:         Final Clinical Impressions(s) / ED Diagnoses   Final diagnoses:  Shortness of breath  Bad headache    New Prescriptions New Prescriptions   BUTALBITAL-ACETAMINOPHEN-CAFFEINE (FIORICET, ESGIC) 50-325-40 MG TABLET    Take 1-2 tablets by mouth every 6 (six) hours as needed for headache.   METOCLOPRAMIDE (REGLAN) 10 MG TABLET    Take 1 tablet (10 mg total) by mouth every 6 (six) hours.  Lorayne Bender, PA-C 12/19/16 0036    Charlesetta Shanks, MD 12/25/16 [phone]

## 2016-12-18 NOTE — ED Provider Notes (Signed)
Medical screening examination/treatment/procedure(s) were conducted as a shared visit with non-physician practitioner(s) and myself.  I personally evaluated the patient during the encounter.   EKG Interpretation None     patient has history of recurrent asthma. Patient seen after first two DuoNeb's. She reports feeling much improved and is ambulatory without significant distress. Heart regular, lungs expiratory wheeze with decreased airflow to the bases.  I agree with plan and management.   Charlesetta Shanks, MD 12/19/16 [phone]

## 2016-12-18 NOTE — ED Notes (Signed)
Code Sepsis INCORRECTLY put in.  NOT A CODE SEPSIS

## 2016-12-18 NOTE — Telephone Encounter (Signed)
Call received from patient, returning CM call. Informed her that Dr Jarold Song would like her to return to the clinic to have bloodwork done ( BMP)  The order has already been placed.  The patient stated that she understood and then stated that she has been increasingly short of breath since her discharge home  and she will probably go to the ED today. She noted that her appointment with  Dr Annamaria Boots was cancelled for Monday, 12/16/16 and today 12/18/16.  She said that she called the pulmonary  office today and they will not be able to see her until 01/01/17. She stated that she has been taking her medications and doing her treatments as prescribed but she is not feeling any better. She also reported " bad" headaches x 2 days. She has been coughing but no vomiting. Encouraged her to seek medical attention in the ED.   Message routed to Dr Jarold Song.

## 2016-12-19 ENCOUNTER — Telehealth: Payer: Self-pay

## 2016-12-19 MED ORDER — METOCLOPRAMIDE HCL 10 MG PO TABS: 10.0000 mg | ORAL_TABLET | Freq: Four times a day (QID) | ORAL | 0 refills | Status: DC

## 2016-12-19 MED ORDER — BUTALBITAL-APAP-CAFFEINE 50-325-40 MG PO TABS: 1.0000 | ORAL_TABLET | Freq: Four times a day (QID) | ORAL | 0 refills | Status: DC | PRN

## 2016-12-19 NOTE — ED Notes (Signed)
Pt walked with a steady gait.

## 2016-12-19 NOTE — Discharge Instructions (Signed)
Please be sure to stay well hydrated by drinking plenty of water. Continue taking the previously prescribed medications.  Antiinflammatory medications: Take 600 mg of ibuprofen every 6 hours or 440 mg (over the counter dose) to 500 mg (prescription dose) of naproxen every 12 hours or for the next 3 days. After this time, these medications may be used as needed for pain. Take these medications with food to avoid upset stomach. Choose only one of these medications, do not take them together. Tylenol: Should you continue to have additional pain while taking the ibuprofen or naproxen, you may add in tylenol as needed. Your daily total maximum amount of tylenol from all sources should be limited to 4000mg /day for persons without liver problems, or 2000mg /day for those with liver problems.  Reglan: You may add Reglan to the above regimen. Fioricet: You may alternatively try the Fioricet for headache. Follow-up with your PCP for any further management of this issue.  You showed significant improvement in your breathing while here in the ED. Please continue to use your albuterol inhalers, as needed. Follow-up with your pulmonologist, as planned. Return to the ED as needed.

## 2016-12-19 NOTE — Telephone Encounter (Signed)
She was seen at the ED.

## 2016-12-19 NOTE — Telephone Encounter (Signed)
Attempted to contact the patient to follow up from her ED visit yesterday and discuss scheduling a follow up appointment at [facility]. Call placed to # [phone] (H) and a HIPAA compliant voicemail message was left requesting a call back to # [phone]

## 2016-12-20 ENCOUNTER — Telehealth: Payer: Self-pay

## 2016-12-20 NOTE — Telephone Encounter (Signed)
PCS referral faxed to [facility] - fax # [phone]

## 2016-12-20 NOTE — Telephone Encounter (Signed)
Message received from Marcelino Scot, [place] noting that that the patient is still followed by their Case Management program .

## 2016-12-21 ENCOUNTER — Emergency Department (HOSPITAL_COMMUNITY): Payer: Medicaid Other

## 2016-12-21 ENCOUNTER — Observation Stay (HOSPITAL_COMMUNITY)
Admission: EM | Admit: 2016-12-21 | Discharge: 2016-12-22 | Disposition: A | Discharge status: Home / Self Care | Payer: Medicaid Other | Attending: Internal Medicine | Admitting: Internal Medicine

## 2016-12-21 ENCOUNTER — Encounter (HOSPITAL_COMMUNITY): Payer: Self-pay | Admitting: Emergency Medicine

## 2016-12-21 DIAGNOSIS — D72829 Elevated white blood cell count, unspecified: Secondary | ICD-10-CM | POA: Diagnosis present

## 2016-12-21 DIAGNOSIS — I10 Essential (primary) hypertension: Secondary | ICD-10-CM | POA: Diagnosis present

## 2016-12-21 DIAGNOSIS — Z87891 Personal history of nicotine dependence: Secondary | ICD-10-CM | POA: Diagnosis not present

## 2016-12-21 DIAGNOSIS — R062 Wheezing: Secondary | ICD-10-CM

## 2016-12-21 DIAGNOSIS — F329 Major depressive disorder, single episode, unspecified: Secondary | ICD-10-CM | POA: Insufficient documentation

## 2016-12-21 DIAGNOSIS — F419 Anxiety disorder, unspecified: Secondary | ICD-10-CM | POA: Diagnosis present

## 2016-12-21 DIAGNOSIS — Z79899 Other long term (current) drug therapy: Secondary | ICD-10-CM | POA: Insufficient documentation

## 2016-12-21 DIAGNOSIS — I160 Hypertensive urgency: Secondary | ICD-10-CM | POA: Diagnosis not present

## 2016-12-21 DIAGNOSIS — I152 Hypertension secondary to endocrine disorders: Secondary | ICD-10-CM

## 2016-12-21 DIAGNOSIS — J4551 Severe persistent asthma with (acute) exacerbation: Secondary | ICD-10-CM

## 2016-12-21 DIAGNOSIS — I5032 Chronic diastolic (congestive) heart failure: Secondary | ICD-10-CM | POA: Insufficient documentation

## 2016-12-21 DIAGNOSIS — K5900 Constipation, unspecified: Secondary | ICD-10-CM | POA: Insufficient documentation

## 2016-12-21 DIAGNOSIS — G4733 Obstructive sleep apnea (adult) (pediatric): Secondary | ICD-10-CM | POA: Diagnosis present

## 2016-12-21 DIAGNOSIS — Z6841 Body Mass Index (BMI) 40.0 and over, adult: Secondary | ICD-10-CM | POA: Insufficient documentation

## 2016-12-21 DIAGNOSIS — D509 Iron deficiency anemia, unspecified: Secondary | ICD-10-CM | POA: Diagnosis present

## 2016-12-21 DIAGNOSIS — Z720 Tobacco use: Secondary | ICD-10-CM | POA: Diagnosis present

## 2016-12-21 DIAGNOSIS — J4540 Moderate persistent asthma, uncomplicated: Secondary | ICD-10-CM | POA: Insufficient documentation

## 2016-12-21 DIAGNOSIS — R0602 Shortness of breath: Secondary | ICD-10-CM

## 2016-12-21 DIAGNOSIS — Z7984 Long term (current) use of oral hypoglycemic drugs: Secondary | ICD-10-CM | POA: Insufficient documentation

## 2016-12-21 DIAGNOSIS — J45901 Unspecified asthma with (acute) exacerbation: Secondary | ICD-10-CM | POA: Diagnosis present

## 2016-12-21 DIAGNOSIS — F5101 Primary insomnia: Secondary | ICD-10-CM | POA: Insufficient documentation

## 2016-12-21 DIAGNOSIS — J441 Chronic obstructive pulmonary disease with (acute) exacerbation: Principal | ICD-10-CM | POA: Insufficient documentation

## 2016-12-21 DIAGNOSIS — F411 Generalized anxiety disorder: Secondary | ICD-10-CM | POA: Diagnosis not present

## 2016-12-21 DIAGNOSIS — D649 Anemia, unspecified: Secondary | ICD-10-CM | POA: Diagnosis present

## 2016-12-21 DIAGNOSIS — I110 Hypertensive heart disease with heart failure: Secondary | ICD-10-CM | POA: Diagnosis not present

## 2016-12-21 DIAGNOSIS — F330 Major depressive disorder, recurrent, mild: Secondary | ICD-10-CM

## 2016-12-21 DIAGNOSIS — Z9989 Dependence on other enabling machines and devices: Secondary | ICD-10-CM

## 2016-12-21 DIAGNOSIS — E119 Type 2 diabetes mellitus without complications: Secondary | ICD-10-CM

## 2016-12-21 DIAGNOSIS — F32A Depression, unspecified: Secondary | ICD-10-CM | POA: Diagnosis present

## 2016-12-21 DIAGNOSIS — K219 Gastro-esophageal reflux disease without esophagitis: Secondary | ICD-10-CM | POA: Diagnosis present

## 2016-12-21 DIAGNOSIS — E2609 Other primary hyperaldosteronism: Secondary | ICD-10-CM | POA: Diagnosis present

## 2016-12-21 DIAGNOSIS — F339 Major depressive disorder, recurrent, unspecified: Secondary | ICD-10-CM | POA: Diagnosis present

## 2016-12-21 LAB — COMPREHENSIVE METABOLIC PANEL
ALBUMIN: 2.9 g/dL — AB (ref 3.5–5.0)
ALK PHOS: 49 U/L (ref 38–126)
ALT: 33 U/L (ref 14–54)
AST: 22 U/L (ref 15–41)
Anion gap: 7 (ref 5–15)
BILIRUBIN TOTAL: 0.4 mg/dL (ref 0.3–1.2)
BUN: 12 mg/dL (ref 6–20)
CO2: 27 mmol/L (ref 22–32)
CREATININE: 0.77 mg/dL (ref 0.44–1.00)
Calcium: 8.1 mg/dL — ABNORMAL LOW (ref 8.9–10.3)
Chloride: 105 mmol/L (ref 101–111)
GFR calc Af Amer: >60 mL/min (ref >60)
GLUCOSE: 99 mg/dL (ref 65–99)
POTASSIUM: 3.6 mmol/L (ref 3.5–5.1)
Sodium: 139 mmol/L (ref 135–145)
Total Protein: 5.3 g/dL — ABNORMAL LOW (ref 6.5–8.1)

## 2016-12-21 LAB — CBC WITH DIFFERENTIAL/PLATELET
Basophils Absolute: 0 10*3/uL (ref 0.0–0.1)
Basophils Relative: 0 %
EOS ABS: 0 10*3/uL (ref 0.0–0.7)
EOS PCT: 0 %
HCT: 37.9 % (ref 36.0–46.0)
Hemoglobin: 11.9 g/dL — ABNORMAL LOW (ref 12.0–15.0)
LYMPHS ABS: 3.9 10*3/uL (ref 0.7–4.0)
LYMPHS PCT: 18 %
MCH: 26.5 pg (ref 26.0–34.0)
MCHC: 31.4 g/dL (ref 30.0–36.0)
MCV: 84.4 fL (ref 78.0–100.0)
MONOS PCT: 9 %
Monocytes Absolute: 2 10*3/uL — ABNORMAL HIGH (ref 0.1–1.0)
Neutro Abs: 15.9 10*3/uL — ABNORMAL HIGH (ref 1.7–7.7)
Neutrophils Relative %: 73 %
PLATELETS: 282 10*3/uL (ref 150–400)
RBC: 4.49 MIL/uL (ref 3.87–5.11)
RDW: 20 % — ABNORMAL HIGH (ref 11.5–15.5)
WBC: 21.8 10*3/uL — ABNORMAL HIGH (ref 4.0–10.5)

## 2016-12-21 LAB — I-STAT CG4 LACTIC ACID, ED: Lactic Acid, Venous: 1.37 mmol/L (ref 0.5–1.9)

## 2016-12-21 LAB — GLUCOSE, CAPILLARY: GLUCOSE-CAPILLARY: 203 mg/dL — AB (ref 65–99)

## 2016-12-21 MED ORDER — SODIUM CHLORIDE 0.9% FLUSH: 3.0000 mL | INTRAVENOUS | Status: DC | PRN

## 2016-12-21 MED ORDER — IPRATROPIUM BROMIDE 0.02 % IN SOLN
0.5000 mg | Freq: Once | RESPIRATORY_TRACT | Status: AC
  Administered 2016-12-21: 0.5 mg via RESPIRATORY_TRACT
  Filled 2016-12-21: qty 2.5

## 2016-12-21 MED ORDER — ALBUTEROL (5 MG/ML) CONTINUOUS INHALATION SOLN
10.0000 mg/h | INHALATION_SOLUTION | RESPIRATORY_TRACT | Status: DC
  Administered 2016-12-21: 10 mg/h via RESPIRATORY_TRACT
  Filled 2016-12-21: qty 20

## 2016-12-21 MED ORDER — INSULIN ASPART 100 UNIT/ML ~~LOC~~ SOLN
0.0000 [IU] | Freq: Three times a day (TID) | SUBCUTANEOUS | Status: DC
  Administered 2016-12-22: 2 [IU] via SUBCUTANEOUS

## 2016-12-21 MED ORDER — LORATADINE 10 MG PO TABS
10.0000 mg | ORAL_TABLET | Freq: Every day | ORAL | Status: DC
Start: 2016-12-22 — End: 2016-12-22
  Administered 2016-12-22: 10 mg via ORAL
  Filled 2016-12-21: qty 1

## 2016-12-21 MED ORDER — SERTRALINE HCL 50 MG PO TABS
50.0000 mg | ORAL_TABLET | Freq: Every day | ORAL | Status: DC
  Administered 2016-12-22: 50 mg via ORAL
  Filled 2016-12-21: qty 1

## 2016-12-21 MED ORDER — TRAZODONE HCL 50 MG PO TABS
50.0000 mg | ORAL_TABLET | Freq: Every day | ORAL | Status: DC
  Administered 2016-12-21: 50 mg via ORAL
  Filled 2016-12-21: qty 1

## 2016-12-21 MED ORDER — SODIUM CHLORIDE 0.9 % IV SOLN: 250.0000 mL | INTRAVENOUS | Status: DC | PRN

## 2016-12-21 MED ORDER — ALBUTEROL SULFATE (2.5 MG/3ML) 0.083% IN NEBU
2.5000 mg | INHALATION_SOLUTION | RESPIRATORY_TRACT | Status: DC | PRN
  Administered 2016-12-22: 2.5 mg via RESPIRATORY_TRACT
  Filled 2016-12-21: qty 3

## 2016-12-21 MED ORDER — BENZONATATE 100 MG PO CAPS: 200.0000 mg | ORAL_CAPSULE | Freq: Three times a day (TID) | ORAL | Status: DC | PRN

## 2016-12-21 MED ORDER — ALBUTEROL SULFATE (2.5 MG/3ML) 0.083% IN NEBU
INHALATION_SOLUTION | RESPIRATORY_TRACT | Status: AC
  Filled 2016-12-21: qty 3

## 2016-12-21 MED ORDER — SPIRONOLACTONE 50 MG PO TABS
50.0000 mg | ORAL_TABLET | Freq: Every day | ORAL | Status: DC
Start: 2016-12-22 — End: 2016-12-22
  Administered 2016-12-22: 50 mg via ORAL
  Filled 2016-12-21: qty 1
  Filled 2016-12-21: qty 2

## 2016-12-21 MED ORDER — METHOCARBAMOL 500 MG PO TABS
500.0000 mg | ORAL_TABLET | Freq: Two times a day (BID) | ORAL | Status: DC | PRN
  Administered 2016-12-21: 500 mg via ORAL
  Filled 2016-12-21: qty 1

## 2016-12-21 MED ORDER — METHYLPREDNISOLONE SODIUM SUCC 125 MG IJ SOLR
60.0000 mg | Freq: Four times a day (QID) | INTRAMUSCULAR | Status: DC
  Administered 2016-12-21 – 2016-12-22 (×3): 60 mg via INTRAVENOUS
  Filled 2016-12-21 (×3): qty 2

## 2016-12-21 MED ORDER — BUTALBITAL-APAP-CAFFEINE 50-325-40 MG PO TABS
1.0000 | ORAL_TABLET | Freq: Four times a day (QID) | ORAL | Status: DC | PRN
  Administered 2016-12-21 – 2016-12-22 (×2): 1 via ORAL
  Filled 2016-12-21 (×2): qty 1

## 2016-12-21 MED ORDER — FLUTICASONE PROPIONATE 50 MCG/ACT NA SUSP
2.0000 | Freq: Every day | NASAL | Status: DC
  Administered 2016-12-22: 2 via NASAL
  Filled 2016-12-21: qty 16

## 2016-12-21 MED ORDER — ALBUTEROL SULFATE (2.5 MG/3ML) 0.083% IN NEBU
5.0000 mg | INHALATION_SOLUTION | Freq: Once | RESPIRATORY_TRACT | Status: AC
  Administered 2016-12-21: 5 mg via RESPIRATORY_TRACT
  Filled 2016-12-21: qty 6

## 2016-12-21 MED ORDER — BUDESONIDE 0.25 MG/2ML IN SUSP
0.2500 mg | Freq: Two times a day (BID) | RESPIRATORY_TRACT | Status: DC
  Administered 2016-12-22 (×2): 0.25 mg via RESPIRATORY_TRACT
  Filled 2016-12-21 (×2): qty 2

## 2016-12-21 MED ORDER — METOCLOPRAMIDE HCL 10 MG PO TABS
10.0000 mg | ORAL_TABLET | Freq: Four times a day (QID) | ORAL | Status: DC
  Administered 2016-12-21 – 2016-12-22 (×2): 10 mg via ORAL
  Filled 2016-12-21 (×3): qty 1

## 2016-12-21 MED ORDER — SODIUM CHLORIDE 0.9% FLUSH
3.0000 mL | Freq: Two times a day (BID) | INTRAVENOUS | Status: DC
  Administered 2016-12-21 – 2016-12-22 (×2): 3 mL via INTRAVENOUS

## 2016-12-21 MED ORDER — ACETAMINOPHEN 650 MG RE SUPP
650.0000 mg | Freq: Four times a day (QID) | RECTAL | Status: DC | PRN
Start: 2016-12-21 — End: 2016-12-22

## 2016-12-21 MED ORDER — ALBUTEROL SULFATE (2.5 MG/3ML) 0.083% IN NEBU
5.0000 mg | INHALATION_SOLUTION | Freq: Once | RESPIRATORY_TRACT | Status: AC
  Administered 2016-12-21: 5 mg via RESPIRATORY_TRACT

## 2016-12-21 MED ORDER — ACETAMINOPHEN-CODEINE #3 300-30 MG PO TABS
1.0000 | ORAL_TABLET | Freq: Two times a day (BID) | ORAL | Status: DC | PRN
  Administered 2016-12-22: 1 via ORAL
  Filled 2016-12-21: qty 1

## 2016-12-21 MED ORDER — BISACODYL 5 MG PO TBEC: 5.0000 mg | DELAYED_RELEASE_TABLET | Freq: Every day | ORAL | Status: DC | PRN

## 2016-12-21 MED ORDER — LOSARTAN POTASSIUM 50 MG PO TABS
100.0000 mg | ORAL_TABLET | Freq: Every day | ORAL | Status: DC
  Administered 2016-12-22: 100 mg via ORAL
  Filled 2016-12-21: qty 2

## 2016-12-21 MED ORDER — ENOXAPARIN SODIUM 80 MG/0.8ML ~~LOC~~ SOLN
75.0000 mg | SUBCUTANEOUS | Status: DC
  Administered 2016-12-22: 75 mg via SUBCUTANEOUS
  Filled 2016-12-21: qty 0.8

## 2016-12-21 MED ORDER — PANTOPRAZOLE SODIUM 40 MG PO TBEC
40.0000 mg | DELAYED_RELEASE_TABLET | Freq: Two times a day (BID) | ORAL | Status: DC
  Administered 2016-12-21 – 2016-12-22 (×2): 40 mg via ORAL
  Filled 2016-12-21 (×2): qty 1

## 2016-12-21 MED ORDER — NICOTINE 14 MG/24HR TD PT24
14.0000 mg | MEDICATED_PATCH | Freq: Every day | TRANSDERMAL | Status: DC | PRN
  Administered 2016-12-21: 14 mg via TRANSDERMAL
  Filled 2016-12-21: qty 1

## 2016-12-21 MED ORDER — ARFORMOTEROL TARTRATE 15 MCG/2ML IN NEBU
15.0000 ug | INHALATION_SOLUTION | Freq: Two times a day (BID) | RESPIRATORY_TRACT | Status: DC
  Administered 2016-12-22 (×2): 15 ug via RESPIRATORY_TRACT
  Filled 2016-12-21 (×2): qty 2

## 2016-12-21 MED ORDER — MAGNESIUM SULFATE 2 GM/50ML IV SOLN
2.0000 g | Freq: Once | INTRAVENOUS | Status: AC
  Administered 2016-12-21: 2 g via INTRAVENOUS
  Filled 2016-12-21: qty 50

## 2016-12-21 MED ORDER — AMLODIPINE BESYLATE 5 MG PO TABS
10.0000 mg | ORAL_TABLET | Freq: Every day | ORAL | Status: DC
  Administered 2016-12-22: 10 mg via ORAL
  Filled 2016-12-21: qty 2

## 2016-12-21 MED ORDER — GUAIFENESIN ER 600 MG PO TB12
600.0000 mg | ORAL_TABLET | Freq: Two times a day (BID) | ORAL | Status: DC
  Administered 2016-12-21 – 2016-12-22 (×2): 600 mg via ORAL
  Filled 2016-12-21 (×2): qty 1

## 2016-12-21 MED ORDER — ZOLPIDEM TARTRATE 5 MG PO TABS
5.0000 mg | ORAL_TABLET | Freq: Every evening | ORAL | Status: DC | PRN
  Administered 2016-12-21: 5 mg via ORAL
  Filled 2016-12-21: qty 1

## 2016-12-21 MED ORDER — SENNOSIDES-DOCUSATE SODIUM 8.6-50 MG PO TABS: 1.0000 | ORAL_TABLET | Freq: Every day | ORAL | Status: DC | PRN

## 2016-12-21 MED ORDER — ONDANSETRON HCL 4 MG PO TABS: 4.0000 mg | ORAL_TABLET | Freq: Four times a day (QID) | ORAL | Status: DC | PRN

## 2016-12-21 MED ORDER — HYDRALAZINE HCL 20 MG/ML IJ SOLN: 10.0000 mg | INTRAMUSCULAR | Status: DC | PRN

## 2016-12-21 MED ORDER — FUROSEMIDE 20 MG PO TABS
20.0000 mg | ORAL_TABLET | Freq: Every day | ORAL | Status: DC
  Administered 2016-12-22: 20 mg via ORAL
  Filled 2016-12-21: qty 1

## 2016-12-21 MED ORDER — ADULT MULTIVITAMIN W/MINERALS CH
1.0000 | ORAL_TABLET | Freq: Every day | ORAL | Status: DC
  Administered 2016-12-22: 1 via ORAL
  Filled 2016-12-21: qty 1

## 2016-12-21 MED ORDER — ACETAMINOPHEN 325 MG PO TABS: 650.0000 mg | ORAL_TABLET | Freq: Four times a day (QID) | ORAL | Status: DC | PRN

## 2016-12-21 MED ORDER — METHYLPREDNISOLONE SODIUM SUCC 125 MG IJ SOLR
125.0000 mg | Freq: Once | INTRAMUSCULAR | Status: AC
  Administered 2016-12-21: 125 mg via INTRAVENOUS
  Filled 2016-12-21: qty 2

## 2016-12-21 MED ORDER — INSULIN ASPART 100 UNIT/ML ~~LOC~~ SOLN
0.0000 [IU] | Freq: Every day | SUBCUTANEOUS | Status: DC
  Administered 2016-12-21: 2 [IU] via SUBCUTANEOUS

## 2016-12-21 MED ORDER — METOPROLOL TARTRATE 25 MG PO TABS
12.5000 mg | ORAL_TABLET | Freq: Two times a day (BID) | ORAL | Status: DC
  Administered 2016-12-21 – 2016-12-22 (×2): 12.5 mg via ORAL
  Filled 2016-12-21 (×2): qty 1

## 2016-12-21 MED ORDER — ONDANSETRON HCL 4 MG/2ML IJ SOLN: 4.0000 mg | Freq: Four times a day (QID) | INTRAMUSCULAR | Status: DC | PRN

## 2016-12-21 NOTE — ED Notes (Signed)
Admitting MD at bedside.

## 2016-12-21 NOTE — Progress Notes (Signed)
April [name] [id number] Admitted to 5W10: 12/21/2016 10:42 PM Attending Provider: [name], MD    April [name] is a 46 y.o. female patient admitted from ED awake, alert  & orientated  X 3,  Full Code, VSS - Blood pressure (!) 178/84, pulse 74, temperature 98.3 F (36.8 C), temperature source Oral, resp. rate 18, height 5\' 5"  (1.651 m), weight (!) 155.1 kg (342 lb), SpO2 100 %.RA, no c/o shortness of breath, no c/o chest pain, no distress noted.    IV site WDL:  with a transparent dsg that's clean dry and intact.  Allergies:  No Known Allergies   Past Medical History:  Diagnosis Date  . Acanthosis nigricans   . Arthritis   . Asthma    Followed by Dr. Annamaria Boots (pulmonology); receives every other week omalizumab injections; has frequent exacerbations  . COPD (chronic obstructive pulmonary disease) ([place])    PFTs in 2002, FEV1/FVC 65, no post bronchodilater test done  . Depression   . GERD (gastroesophageal reflux disease)   . Headache(784.0)   . Helicobacter pylori (H. pylori) infection   . Hypertension, essential   . Insomnia   . Menorrhagia   . Morbid obesity ([place])   . Obesity   . Seasonal allergies   . Shortness of breath   . Sleep apnea    Sleep study 2008 - mild OSA, not enough events to titrate CPAP  . Tobacco user     History:  obtained from patient  Pt orientation to unit, room and routine. Information packet given to patient.  Admission INP armband ID verified with patient, and in place. SR up x 2, fall risk assessment complete with Patient verbalizing understanding of risks associated with falls. Pt verbalizes an understanding of how to use the call bell and to call for help before getting out of bed.  Patient refuses skin assessment.   Will cont to monitor and assist as needed.  April [name], RN 12/21/2016 10:42 PM

## 2016-12-21 NOTE — H&P (Signed)
History and Physical    April [name] ZOX:[id number] DOB: [birth year] DOA: 12/21/2016  PCP: Arnoldo Morale, MD   Patient coming from: Home  Chief Complaint: SOB, wheezing   HPI: April [name] is a 46 y.o. female with medical history significant for persistent asthma with frequent exacerbation, type 2 diabetes mellitus, depression with anxiety, OSA, and hypertension, now presenting to the emergency department with worsening dyspnea and wheezing. Patient was hospitalized from 12/10/2016 until 12/13/2016 for asthma exacerbation in the setting of a right lower lobe pneumonia. She was discharged with Augmentin and prednisone taper. She began to experience worsening in her respiratory status several days ago and was seen in the emergency department on 12/18/2016 for this. She was treated with nebs and advised to complete her Augmentin and prednisone. She was discharged from the ED at that time and much improved condition. She now returns with worsening, now dyspneic at rest and with reported difficulty catching her breath. Denies fevers or chills and denies chest pain or palpitations. No increase in her chronic swelling.  ED Course: Upon arrival to the ED, patient is found to be afebrile, saturating well on room air, hypertensive to 174/100, and with vitals otherwise stable. Chest x-ray is negative for acute cardiopulmonary disease. Chemistry panel reveals an albumin of 2.9 and total protein of 5.3. CBC features a leukocytosis to 21,896 white normocytic anemia with hemoglobin of 11.9. Lactic acid is reassuring at 1.37. Patient was treated with 2 g of IV magnesium, 125 mg IV Solu-Medrol, Atrovent neb, multiple continuous albuterol nebs, and she remained very dyspneic at rest. She will be admitted to the medical surgical unit for ongoing evaluation and management of asthma with acute exacerbation.   Review of Systems:  All other systems reviewed and apart from HPI, are negative.  Past Medical History:    Diagnosis Date  . Acanthosis nigricans   . Arthritis   . Asthma    Followed by Dr. Annamaria Boots (pulmonology); receives every other week omalizumab injections; has frequent exacerbations  . COPD (chronic obstructive pulmonary disease) ([place])    PFTs in 2002, FEV1/FVC 65, no post bronchodilater test done  . Depression   . GERD (gastroesophageal reflux disease)   . Headache(784.0)   . Helicobacter pylori (H. pylori) infection   . Hypertension, essential   . Insomnia   . Menorrhagia   . Morbid obesity ([place])   . Obesity   . Seasonal allergies   . Shortness of breath   . Sleep apnea    Sleep study 2008 - mild OSA, not enough events to titrate CPAP  . Tobacco user     Past Surgical History:  Procedure Laterality Date  . BREAST REDUCTION SURGERY  09/2011  . TUBAL LIGATION  1996   bilateral     reports that she quit smoking about 2 years ago. Her smoking use included Cigarettes. She has a 9.00 pack-year smoking history. She has never used smokeless tobacco. She reports that she does not drink alcohol or use drugs.  No Known Allergies  Family History  Problem Relation Age of Onset  . Hypertension Mother   . Asthma Daughter   . Cancer Paternal Aunt   . Asthma Maternal Grandmother      Prior to Admission medications   Medication Sig Start Date End Date Taking? Authorizing Provider  acetaminophen-codeine (TYLENOL #3) 300-30 MG tablet Take 1 tablet by mouth every 12 (twelve) hours as needed for moderate pain. 12/13/16  Yes Arnoldo Morale, MD  albuterol (PROAIR HFA)  108 (90 Base) MCG/ACT inhaler Inhale 2 puffs into the lungs every 6 (six) hours as needed for wheezing or shortness of breath. 12/13/16  Yes Rai, Ripudeep K, MD  albuterol (PROVENTIL) (2.5 MG/3ML) 0.083% nebulizer solution Take 3 mLs (2.5 mg total) by nebulization every 4 (four) hours as needed for wheezing or shortness of breath. 12/13/16  Yes Rai, Ripudeep K, MD  amLODipine (NORVASC) 10 MG tablet Take 1 tablet (10 mg total) by  mouth daily. 12/13/16  Yes Rai, Ripudeep K, MD  arformoterol (BROVANA) 15 MCG/2ML NEBU Take 2 mLs (15 mcg total) by nebulization 2 (two) times daily. 12/13/16  Yes Rai, Ripudeep K, MD  benzonatate (TESSALON) 200 MG capsule Take 1 capsule (200 mg total) by mouth 3 (three) times daily as needed for cough. 12/13/16  Yes Rai, Ripudeep K, MD  budesonide (PULMICORT) 0.25 MG/2ML nebulizer solution Take 2 mLs (0.25 mg total) by nebulization 2 (two) times daily. 12/13/16  Yes Rai, Vernelle Emerald, MD  butalbital-acetaminophen-caffeine (FIORICET, ESGIC) [phone] MG tablet Take 1-2 tablets by mouth every 6 (six) hours as needed for headache. 12/19/16 12/19/17 Yes Joy, Shawn C, PA-C  fluticasone (FLONASE) 50 MCG/ACT nasal spray Place 2 sprays into both nostrils daily. 12/13/16  Yes Rai, Ripudeep K, MD  furosemide (LASIX) 20 MG tablet Take 1 tablet (20 mg total) by mouth daily. 10/05/16  Yes Reyne Dumas, MD  guaiFENesin (MUCINEX) 600 MG 12 hr tablet Take 1 tablet (600 mg total) by mouth 2 (two) times daily. 12/13/16  Yes Rai, Ripudeep K, MD  loratadine (CLARITIN) 10 MG tablet Take 1 tablet (10 mg total) by mouth daily. 08/30/15  Yes [name], MD  losartan (COZAAR) 100 MG tablet Take 1 tablet (100 mg total) by mouth daily. 12/13/16  Yes Rai, Ripudeep K, MD  metFORMIN (GLUCOPHAGE) 500 MG tablet Take 1 tablet (500 mg total) by mouth 2 (two) times daily with a meal. 12/13/16  Yes Amao, Enobong, MD  methocarbamol (ROBAXIN) 500 MG tablet Take 1 tablet (500 mg total) by mouth 2 (two) times daily. Patient taking differently: Take 500 mg by mouth 2 (two) times daily as needed for muscle spasms.  10/20/16  Yes Rai, Ripudeep K, MD  metoCLOPramide (REGLAN) 10 MG tablet Take 1 tablet (10 mg total) by mouth every 6 (six) hours. 12/19/16  Yes Joy, Shawn C, PA-C  metoprolol tartrate (LOPRESSOR) 25 MG tablet Take 0.5 tablets (12.5 mg total) by mouth 2 (two) times daily. 12/13/16  Yes Rai, Ripudeep K, MD  Multiple Vitamin (MULTIVITAMIN WITH  MINERALS) TABS tablet Take 1 tablet by mouth daily. One-A-Day   Yes [provider]  nicotine (NICODERM CQ - DOSED IN MG/24 HOURS) 14 mg/24hr patch Place 1 patch (14 mg total) onto the skin daily. Patient taking differently: Place 14 mg onto the skin daily as needed (smoking cessation).  10/04/16  Yes Reyne Dumas, MD  omeprazole (PRILOSEC) 20 MG capsule Take 1 capsule (20 mg total) by mouth daily. Patient taking differently: Take 20 mg by mouth 2 (two) times daily.  10/09/16  Yes Young, Clinton D, MD  senna-docusate (SENOKOT-S) 8.6-50 MG tablet Start 1 tab QHS, up to 2tabs BID. Max 4 tabs daily for constipation Patient taking differently: Take 1-2 tablets by mouth daily as needed for mild constipation.  10/27/16  Yes Pisciotta, Elmyra Ricks, PA-C  sertraline (ZOLOFT) 50 MG tablet TAKE 1 TABLET BY MOUTH DAILY Patient taking differently: Take 50 mg by mouth once a day 11/18/16  Yes Boykin Nearing, MD  spironolactone (  ALDACTONE) 50 MG tablet Take 1 tablet (50 mg total) by mouth daily. 10/04/16  Yes Reyne Dumas, MD  traZODone (DESYREL) 50 MG tablet Take 0.5-1 tablets (25-50 mg total) by mouth at bedtime as needed for sleep. Patient taking differently: Take 50 mg by mouth at bedtime.  10/04/16  Yes Reyne Dumas, MD  zolpidem (AMBIEN) 10 MG tablet TAKE 1 TABLET BY MOUTH EVERY DAY AT BEDTIME AS NEEDED 12/17/16  Yes Young, Clinton D, MD  diphenhydramine-acetaminophen (TYLENOL PM) 25-500 MG TABS tablet Take 1 tablet by mouth at bedtime as needed (for sleep).     [provider]  lactobacillus (FLORANEX/LACTINEX) PACK Take 1 packet (1 g total) by mouth 3 (three) times daily with meals. Patient not taking: Reported on 12/21/2016 12/13/16   Mendel Corning, MD    Physical Exam: Vitals:   12/21/16 1515 12/21/16 1745 12/21/16 1830 12/21/16 1845  BP: (!) 155/93 (!) 166/92  (!) 164/56  Pulse: 70 65  66  Resp:      Temp:      TempSrc:      SpO2: 100% 99% 99% 100%  Weight:      Height:           Constitutional: NAD, calm, obese, increased WOB Eyes: PERTLA, lids and conjunctivae normal ENMT: Mucous membranes are moist. Posterior pharynx clear of any exudate or lesions.   Neck: normal, supple, no masses, no thyromegaly Respiratory: Breath sounds diminished bilaterally with expiratory wheezes. Dyspneic with speech. Increased WOB. No pallor or cyanosis.   Cardiovascular: S1 & S2 heard, regular rate and rhythm. 1+ pretibial edema bilaterally. JVP not well visualized. Abdomen: No distension, no tenderness, no masses palpated. Bowel sounds normal.  Musculoskeletal: no clubbing / cyanosis. No joint deformity upper and lower extremities.    Skin: no significant rashes, lesions, ulcers. Warm, dry, well-perfused. Neurologic: CN 2-12 grossly intact. Sensation intact, DTR normal. Strength 5/5 in all 4 limbs.  Psychiatric: Alert and oriented x 3. Calm, cooperative.     Labs on Admission: I have personally reviewed following labs and imaging studies  CBC:  Recent Labs Lab 12/18/16 2131 12/21/16 0952  WBC 18.9* 21.8*  NEUTROABS 14.7* 15.9*  HGB 12.0 11.9*  HCT 37.4 37.9  MCV 84.6 84.4  PLT 283 099   Basic Metabolic Panel:  Recent Labs Lab 12/18/16 2131 12/21/16 0952  NA 138 139  K 3.5 3.6  CL 107 105  CO2 22 27  GLUCOSE 126* 99  BUN 12 12  CREATININE 0.75 0.77  CALCIUM 8.5* 8.1*   GFR: Estimated Creatinine Clearance: 136.3 mL/min (by C-G formula based on SCr of 0.77 mg/dL). Liver Function Tests:  Recent Labs Lab 12/18/16 2131 12/21/16 0952  AST 19 22  ALT 24 33  ALKPHOS 50 49  BILITOT 0.3 0.4  PROT 5.9* 5.3*  ALBUMIN 3.0* 2.9*   No results for input(s): LIPASE, AMYLASE in the last 168 hours. No results for input(s): AMMONIA in the last 168 hours. Coagulation Profile: No results for input(s): INR, PROTIME in the last 168 hours. Cardiac Enzymes: No results for input(s): CKTOTAL, CKMB, CKMBINDEX, TROPONINI in the last 168 hours. BNP (last 3  results) No results for input(s): PROBNP in the last 8760 hours. HbA1C: No results for input(s): HGBA1C in the last 72 hours. CBG: No results for input(s): GLUCAP in the last 168 hours. Lipid Profile: No results for input(s): CHOL, HDL, LDLCALC, TRIG, CHOLHDL, LDLDIRECT in the last 72 hours. Thyroid Function Tests: No results for input(s): TSH,  T4TOTAL, FREET4, T3FREE, THYROIDAB in the last 72 hours. Anemia Panel: No results for input(s): VITAMINB12, FOLATE, FERRITIN, TIBC, IRON, RETICCTPCT in the last 72 hours. Urine analysis:    Component Value Date/Time   COLORURINE AMBER (A) 11/13/2016 1830   APPEARANCEUR HAZY (A) 11/13/2016 1830   LABSPEC 1.023 11/13/2016 1830   PHURINE 5.0 11/13/2016 1830   GLUCOSEU NEGATIVE 11/13/2016 1830   GLUCOSEU NEG mg/dL 10/28/2007 2049   HGBUR NEGATIVE 11/13/2016 1830   BILIRUBINUR SMALL (A) 11/13/2016 1830   [place] 11/13/2016 1830   PROTEINUR 30 (A) 11/13/2016 1830   UROBILINOGEN 1.0 11/21/2014 0707   NITRITE NEGATIVE 11/13/2016 1830   LEUKOCYTESUR NEGATIVE 11/13/2016 1830   Sepsis Labs: @LABRCNTIP (procalcitonin:4,lacticidven:4) ) Recent Results (from the past 240 hour(s))  Culture, blood (routine x 2)     Status: None (Preliminary result)   Collection Time: 12/18/16  9:24 PM  Result Value Ref Range Status   Specimen Description BLOOD LEFT HAND  Final   Special Requests IN PEDIATRIC BOTTLE Blood Culture adequate volume  Final   Culture NO GROWTH 3 DAYS  Final   Report Status PENDING  Incomplete     Radiological Exams on Admission: Dg Chest 2 View  Result Date: 12/21/2016 CLINICAL DATA:  Asthma attack.  Wheezing. EXAM: CHEST  2 VIEW COMPARISON:  December 18, 2016 FINDINGS: The heart size and mediastinal contours are within normal limits. Both lungs are clear. The visualized skeletal structures are unremarkable. IMPRESSION: No active cardiopulmonary disease. Electronically Signed   By: Dorise Bullion III M.D   On: 12/21/2016 10:49     EKG: Not performed.   Assessment/Plan  1. Asthma with acute exacerbation  - Pt has persistent asthma followed by pulmonologist and presents with progressive dyspnea at rest and wheezing - Denies fevers/chills or cough, CXR clear  - Improved some in the ED after DuoNeb, continuous albuterol, 2 g IV mag, and 125 mg IV Solu-Medrol, but remains dyspneic at rest  - Plan to check sputum culture and continue systemic steroid, continue Brovana, continue Pulmicort, continue frequent albuterol nebs and systemic steroid with Solu-Medrol 60 mg IV q6h   2. Leukocytosis  - WBC is 21,800 on admission  - No fever, CXR clear  - Likely secondary to prednisone  - Plan to check sputum culture and obtain blood cultures if febrile   3. OSA  - Continue CPAP qHS    4. Hypertension with hypertensive urgency  - BP elevated in ED, possibly influenced by increased WOB  - Continue Lopressor, losartan, Norvasc, and diuretics  - Use hydralazine IVP's prn    5. Depression, anxiety  - Stable  - Continue Zoloft, trazodone  6. Type II DM  - A1c was only 5.8% earlier this month  - Managed with metformin only at home  - She will be on systemic steroids as above  - Check CBG with meals and qHS  - Start a low-intensity SSI with Novolog    DVT prophylaxis: sq Lovenox Code Status: Full  Family Communication: Discussed with patient Disposition Plan: Admit to med-surg Consults called: None Admission status: Inpatient    [name], MD [facility] Pager [phone]  If 7PM-7AM, please contact night-coverage [email or web address] Password [facility] [place]  12/21/2016, 8:17 PM

## 2016-12-21 NOTE — ED Notes (Signed)
Respiratory on the way 

## 2016-12-21 NOTE — ED Provider Notes (Signed)
[place] DEPT Provider Note   CSN: [id number] Arrival date & time: 12/21/16  0905     History   Chief Complaint Chief Complaint  Patient presents with  . Asthma    HPI April [name] is a 46 y.o. female with a PMHx of COPD, asthma, GERD, HTN, DM2, morbid obesity, and tobacco use, who presents to the ED with complaints of ongoing asthma issues. Chart review reveals that patient was last hospitalized from 8/14-17/18 for asthma exacerbation and RLL pneumonia, discharged home with Augmentin and prednisone which she states she finished. She was seen in the ED on 12/18/16 and given 2 DuoNeb's, 125 mg Solu-Medrol, 2 g Mg, and an extra dose of Atrovent and had improved. At that visit, her labs were reassuring (leukocytosis likely from steroids but otherwise labs unremarkable), including a negative pregnancy test and a negative d-dimer. Her chest x-ray showed some stable bronchitic findings but otherwise reassuring. She was discharged home and advised to follow-up with her PCP. She states that since then every time she gets up to go to the bathroom or does any activity, she continues to feel short of breath and has wheezing. She reports that her DuoNeb nebs at home improve her symptoms however she has having to use them every 3-4 hours and she felt like that was too often. She also reports some associated nausea. She has a pulmonologist at [facility] (Dr. Annamaria Boots) and her PCP is at [facility]. Further chart review reveals that she has been admitted almost monthly for asthma exacerbations.  She denies any coughing, URI symptoms, fevers, CP, LE swelling, recent travel/surgery, estrogen use, personal/family hx of DVT/PE, abdominal pain, vomiting, diarrhea, constipation, dysuria, hematuria, myalgias, arthralgias, numbness, tingling, focal weakness, or any other complaint at this time. No sick contacts.   The history is provided by the patient and medical records. No language interpreter was used.  Asthma  This is  a recurrent problem. The current episode started more than 2 days ago. The problem occurs constantly. The problem has not changed since onset.Associated symptoms include shortness of breath. Pertinent negatives include no chest pain and no abdominal pain. The symptoms are aggravated by walking. The symptoms are relieved by medications. Treatments tried: duonebs at home. The treatment provided moderate relief.    Past Medical History:  Diagnosis Date  . Acanthosis nigricans   . Arthritis   . Asthma    Followed by Dr. Annamaria Boots (pulmonology); receives every other week omalizumab injections; has frequent exacerbations  . COPD (chronic obstructive pulmonary disease) ([place])    PFTs in 2002, FEV1/FVC 65, no post bronchodilater test done  . Depression   . GERD (gastroesophageal reflux disease)   . Headache(784.0)   . Helicobacter pylori (H. pylori) infection   . Hypertension, essential   . Insomnia   . Menorrhagia   . Morbid obesity ([place])   . Obesity   . Seasonal allergies   . Shortness of breath   . Sleep apnea    Sleep study 2008 - mild OSA, not enough events to titrate CPAP  . Tobacco user     Patient Active Problem List   Diagnosis Date Noted  . Asthma exacerbation in COPD ([place]) 12/10/2016  . Moderate persistent asthma with (acute) exacerbation 11/13/2016  . Moderate persistent asthma with exacerbation 10/19/2016  . Diarrhea 10/19/2016  . Type 2 diabetes mellitus without complication, without long-term current use of insulin ([place]) 10/02/2016  . Normocytic anemia 10/02/2016  . Respiratory failure ([place]) 10/02/2016  . Hypertensive urgency, malignant  06/22/2016  . Pulmonary edema 06/22/2016  . Hypertensive urgency 06/22/2016  . 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 Lip laceration 05/05/2016  . Elevated hemoglobin A1c 05/05/2016  . Asthma exacerbation 11/10/2015  . GERD (gastroesophageal reflux disease) 08/30/2015  . Generalized anxiety disorder  08/30/2015  . Prediabetes 08/22/2015  . Seasonal allergic rhinitis 08/29/2013  . Leukocytosis 10/21/2012  . Tobacco abuse 10/07/2012  . Asthma, chronic obstructive, without status asthmaticus ([place]) 05/07/2012  . Knee pain, bilateral 04/25/2011  . Primary insomnia 03/14/2011  . Mild obstructive sleep apnea 12/19/2010  . Hypokalemia 08/13/2010  . Cervical back pain with evidence of disc disease 04/08/2008  . Essential hypertension 07/31/2006  . Morbid obesity with body mass index of 50.0-59.9 in adult ([place]) 06/17/2006  . Major depressive disorder, recurrent episode ([place]) 04/10/2006    Past Surgical History:  Procedure Laterality Date  . BREAST REDUCTION SURGERY  09/2011  . TUBAL LIGATION  1996   bilateral    OB History    No data available       Home Medications    Prior to Admission medications   Medication Sig Start Date End Date Taking? Authorizing Provider  acetaminophen-codeine (TYLENOL #3) 300-30 MG tablet Take 1 tablet by mouth every 12 (twelve) hours as needed for moderate pain. 12/13/16   Arnoldo Morale, MD  albuterol (PROAIR HFA) 108 (90 Base) MCG/ACT inhaler Inhale 2 puffs into the lungs every 6 (six) hours as needed for wheezing or shortness of breath. 12/13/16   Rai, Ripudeep K, MD  albuterol (PROVENTIL) (2.5 MG/3ML) 0.083% nebulizer solution Take 3 mLs (2.5 mg total) by nebulization every 4 (four) hours as needed for wheezing or shortness of breath. 12/13/16   Rai, Ripudeep K, MD  amLODipine (NORVASC) 10 MG tablet Take 1 tablet (10 mg total) by mouth daily. 12/13/16   Rai, Vernelle Emerald, MD  amoxicillin-clavulanate (AUGMENTIN) 875-125 MG tablet Take 1 tablet by mouth 2 (two) times daily. X 7 days 12/13/16   Rai, Vernelle Emerald, MD  arformoterol (BROVANA) 15 MCG/2ML NEBU Take 2 mLs (15 mcg total) by nebulization 2 (two) times daily. 12/13/16   Rai, Vernelle Emerald, MD  benzonatate (TESSALON) 200 MG capsule Take 1 capsule (200 mg total) by mouth 3 (three) times daily as needed for  cough. 12/13/16   Rai, Ripudeep K, MD  budesonide (PULMICORT) 0.25 MG/2ML nebulizer solution Take 2 mLs (0.25 mg total) by nebulization 2 (two) times daily. 12/13/16   Mendel Corning, MD  butalbital-acetaminophen-caffeine (FIORICET, ESGIC) [phone] MG tablet Take 1-2 tablets by mouth every 6 (six) hours as needed for headache. 12/19/16 12/19/17  Joy, Shawn C, PA-C  diphenhydramine-acetaminophen (TYLENOL PM) 25-500 MG TABS tablet Take 1 tablet by mouth at bedtime as needed (for sleep).     [provider]  fluticasone (FLONASE) 50 MCG/ACT nasal spray Place 2 sprays into both nostrils daily. 12/13/16   Rai, Vernelle Emerald, MD  furosemide (LASIX) 20 MG tablet Take 1 tablet (20 mg total) by mouth daily. 10/05/16   Reyne Dumas, MD  guaiFENesin (MUCINEX) 600 MG 12 hr tablet Take 1 tablet (600 mg total) by mouth 2 (two) times daily. 12/13/16   Rai, Vernelle Emerald, MD  lactobacillus (FLORANEX/LACTINEX) PACK Take 1 packet (1 g total) by mouth 3 (three) times daily with meals. 12/13/16   Rai, Vernelle Emerald, MD  loratadine (CLARITIN) 10 MG tablet Take 1 tablet (10 mg total) by mouth daily. 08/30/15   [name], MD  losartan (COZAAR) 100  MG tablet Take 1 tablet (100 mg total) by mouth daily. 12/13/16   Rai, Vernelle Emerald, MD  metFORMIN (GLUCOPHAGE) 500 MG tablet Take 1 tablet (500 mg total) by mouth 2 (two) times daily with a meal. 12/13/16   Arnoldo Morale, MD  methocarbamol (ROBAXIN) 500 MG tablet Take 1 tablet (500 mg total) by mouth 2 (two) times daily. Patient taking differently: Take 500 mg by mouth 2 (two) times daily as needed for muscle spasms.  10/20/16   Rai, Vernelle Emerald, MD  metoCLOPramide (REGLAN) 10 MG tablet Take 1 tablet (10 mg total) by mouth every 6 (six) hours. 12/19/16   Joy, Shawn C, PA-C  metoprolol tartrate (LOPRESSOR) 25 MG tablet Take 0.5 tablets (12.5 mg total) by mouth 2 (two) times daily. 12/13/16   Rai, Vernelle Emerald, MD  Multiple Vitamin (MULTIVITAMIN WITH MINERALS) TABS tablet Take 1 tablet by mouth  daily. One-A-Day    [provider]  nicotine (NICODERM CQ - DOSED IN MG/24 HOURS) 14 mg/24hr patch Place 1 patch (14 mg total) onto the skin daily. Patient taking differently: Place 14 mg onto the skin daily as needed (smoking cessation).  10/04/16   Reyne Dumas, MD  omeprazole (PRILOSEC) 20 MG capsule Take 1 capsule (20 mg total) by mouth daily. Patient taking differently: Take 20 mg by mouth 2 (two) times daily.  10/09/16   Deneise Lever, MD  predniSONE (DELTASONE) 10 MG tablet Prednisone dosing: Take  Prednisone 40mg  (4 tabs) x 3 days, then taper to 30mg  (3 tabs) x 3 days, then 20mg  (2 tabs) x 3days, then 10mg  (1 tab) x 3days, then OFF. 12/13/16   Rai, Ripudeep K, MD  senna-docusate (SENOKOT-S) 8.6-50 MG tablet Start 1 tab QHS, up to 2tabs BID. Max 4 tabs daily for constipation Patient taking differently: Take 1-2 tablets by mouth daily as needed for mild constipation.  10/27/16   Pisciotta, Elmyra Ricks, PA-C  sertraline (ZOLOFT) 50 MG tablet TAKE 1 TABLET BY MOUTH DAILY Patient taking differently: Take 50 mg by mouth once a day 11/18/16   Boykin Nearing, MD  spironolactone (ALDACTONE) 50 MG tablet Take 1 tablet (50 mg total) by mouth daily. 10/04/16   Reyne Dumas, MD  traZODone (DESYREL) 50 MG tablet Take 0.5-1 tablets (25-50 mg total) by mouth at bedtime as needed for sleep. Patient taking differently: Take 50 mg by mouth at bedtime.  10/04/16   Reyne Dumas, MD  zolpidem (AMBIEN) 10 MG tablet TAKE 1 TABLET BY MOUTH EVERY DAY AT BEDTIME AS NEEDED 12/17/16   Deneise Lever, MD    Family History Family History  Problem Relation Age of Onset  . Hypertension Mother   . Asthma Daughter   . Cancer Paternal Aunt   . Asthma Maternal Grandmother     Social History Social History  Substance Use Topics  . Smoking status: Former Smoker    Packs/day: 0.50    Years: 18.00    Types: Cigarettes    Quit date: 04/29/2014  . Smokeless tobacco: Never Used  . Alcohol use No     Allergies     Patient has no known allergies.   Review of Systems Review of Systems  Constitutional: Negative for chills and fever.  HENT: Negative for rhinorrhea and sore throat.   Respiratory: Positive for shortness of breath and wheezing. Negative for cough.   Cardiovascular: Negative for chest pain and leg swelling.  Gastrointestinal: Positive for nausea. Negative for abdominal pain, constipation, diarrhea and vomiting.  Genitourinary: Negative for dysuria  and hematuria.  Musculoskeletal: Negative for arthralgias and myalgias.  Skin: Negative for color change.  Allergic/Immunologic: Positive for immunocompromised state (DM2).  Neurological: Negative for weakness and numbness.  Psychiatric/Behavioral: Negative for confusion.   All other systems reviewed and are negative for acute change except as noted in the HPI.    Physical Exam Updated Vital Signs BP (!) 165/94 (BP Location: Left Arm)   Pulse 74   Temp 97.7 F (36.5 C) (Oral)   Resp 20   Ht 5\' 5"  (1.651 m)   Wt (!) 155.1 kg (342 lb)   SpO2 98%   BMI 56.91 kg/m   Physical Exam  Constitutional: She is oriented to person, place, and time. Vital signs are normal. She appears well-developed and well-nourished.  Non-toxic appearance. No distress.  Afebrile, nontoxic, NAD, morbidly obese  HENT:  Head: Normocephalic and atraumatic.  Mouth/Throat: Oropharynx is clear and moist and mucous membranes are normal.  Eyes: Conjunctivae and EOM are normal. Right eye exhibits no discharge. Left eye exhibits no discharge.  Neck: Normal range of motion. Neck supple.  Cardiovascular: Normal rate, regular rhythm, normal heart sounds and intact distal pulses.  Exam reveals no gallop and no friction rub.   No murmur heard. RRR, nl s1/s2, no m/r/g, distal pulses intact, no pedal edema   Pulmonary/Chest: Effort normal. No respiratory distress. She has no decreased breath sounds. She has wheezes. She has no rhonchi. She has no rales.  Body habitus  significantly limits exam. Very faint expiratory wheezing throughout, no definite rhonchi/rales,  no hypoxia or increased WOB, speaking in full sentences, SpO2 98% on RA   Abdominal: Soft. Normal appearance and bowel sounds are normal. She exhibits no distension. There is no tenderness. There is no rigidity, no rebound, no guarding, no CVA tenderness, no tenderness at McBurney's point and negative Murphy's sign.  Musculoskeletal: Normal range of motion.  MAE x4 Strength and sensation grossly intact in all extremities Distal pulses intact Gait steady No pedal edema, neg homan's bilaterally   Neurological: She is alert and oriented to person, place, and time. She has normal strength. No sensory deficit.  Skin: Skin is warm, dry and intact. No rash noted.  Psychiatric: She has a normal mood and affect.  Nursing note and vitals reviewed.    ED Treatments / Results  Labs (all labs ordered are listed, but only abnormal results are displayed) Labs Reviewed  COMPREHENSIVE METABOLIC PANEL - Abnormal; Notable for the following:       Result Value   Calcium 8.1 (*)    Total Protein 5.3 (*)    Albumin 2.9 (*)    All other components within normal limits  CBC WITH DIFFERENTIAL/PLATELET - Abnormal; Notable for the following:    WBC 21.8 (*)    Hemoglobin 11.9 (*)    RDW 20.0 (*)    Neutro Abs 15.9 (*)    Monocytes Absolute 2.0 (*)    All other components within normal limits  I-STAT CG4 LACTIC ACID, ED    EKG  EKG Interpretation None       Radiology Dg Chest 2 View  Result Date: 12/21/2016 CLINICAL DATA:  Asthma attack.  Wheezing. EXAM: CHEST  2 VIEW COMPARISON:  December 18, 2016 FINDINGS: The heart size and mediastinal contours are within normal limits. Both lungs are clear. The visualized skeletal structures are unremarkable. IMPRESSION: No active cardiopulmonary disease. Electronically Signed   By: Dorise Bullion III M.D   On: 12/21/2016 10:49    Procedures Procedures (  including  critical care time)  CRITICAL CARE-- asthma exacerbation requiring multiple nebulizer treatments Performed by: Reece Agar   Total critical care time: 45 minutes  Critical care time was exclusive of separately billable procedures and treating other patients.  Critical care was necessary to treat or prevent imminent or life-threatening deterioration.  Critical care was time spent personally by me on the following activities: development of treatment plan with patient and/or surrogate as well as nursing, discussions with consultants, evaluation of patient's response to treatment, examination of patient, obtaining history from patient or surrogate, ordering and performing treatments and interventions, ordering and review of laboratory studies, ordering and review of radiographic studies, pulse oximetry and re-evaluation of patient's condition.   Medications Ordered in ED Medications  albuterol (PROVENTIL,VENTOLIN) solution continuous neb (10 mg/hr Nebulization New Bag/Given 12/21/16 1827)  albuterol (PROVENTIL) (2.5 MG/3ML) 0.083% nebulizer solution 5 mg (5 mg Nebulization Given 12/21/16 0957)  albuterol (PROVENTIL) (2.5 MG/3ML) 0.083% nebulizer solution 5 mg (5 mg Nebulization Given 12/21/16 1237)  ipratropium (ATROVENT) nebulizer solution 0.5 mg (0.5 mg Nebulization Given 12/21/16 1237)  magnesium sulfate IVPB 2 g 50 mL (0 g Intravenous Stopped 12/21/16 1605)  albuterol (PROVENTIL) (2.5 MG/3ML) 0.083% nebulizer solution 5 mg (5 mg Nebulization Given 12/21/16 1457)  ipratropium (ATROVENT) nebulizer solution 0.5 mg (0.5 mg Nebulization Given 12/21/16 1457)  methylPREDNISolone sodium succinate (SOLU-MEDROL) 125 mg/2 mL injection 125 mg (125 mg Intravenous Given 12/21/16 1726)     Initial Impression / Assessment and Plan / ED Course  I have reviewed the triage vital signs and the nursing notes.  Pertinent labs & imaging results that were available during my care of the patient were reviewed by  me and considered in my medical decision making (see chart for details).     46 y.o. female here with c/o asthma exacerbation; was admitted 2wks ago for this, discharged on augmentin and pred for RLL PNA. Then was seen in ED 3 days ago, give 2 duonebs, solumedrol, Mg 2g, and an extra atrovent dose, and had improved. That day her labs were reassuring, including neg preg test and neg D-Dimer. CXR still showed mild bronchitic findings. Discharged home. States she continues to feel SOB and wheezing with exertion/activity. On exam, calm and in NAD, no increased WOB, faint expiratory wheezing throughout however body habitus significantly limits good evaluation; no definite rhonchi/rales. No hypoxia or tachycardia, no LE swelling. Work up today thus far shows: lactic WNL, CBC w/diff with leukocytosis likely from steroids; CMP essentially unremarkable. CXR improved, and without acute findings. Doubt need for further emergent work up/labs/etc, but will give duoneb and see how she does. Will hold off on steroids since she's been on them a lot over the last few weeks, will try to avoid this for now.   2:26 PM Lung sounds improved after duoneb, but pt still states she feels the same; SpO2 99% on RA, resting comfortably in bed watching TV, in NAD; pt wanting to try Mg since she says that helps. Will give second duoneb and Mg, and hopefully this will make her feel improved. Will reassess shortly  4:42 PM Pt feeling slightly better, but lung sounds now actually much wheezier, seems to be moving more air than previously so now the wheezing is more obvious. Will attempt CAT tx now but if she still has wheezing, may need to consider admission. Will go ahead and give solumedrol now too. Discussed case with my attending Dr. Ellender Hose who agrees with plan.   7:47 PM Slight  delay getting CAT tx started, but tx just about done now; pt with slightly less wheezing than before, but still having fairly diffuse wheezing throughout.  Having a slight amount of increased WOB as well, and although not hypoxic at rest, she feels SOB going to the bathroom right next door to her room and still doesn't feel well enough to go home. At this point,she would likely benefit from at least overnight admission for ongoing breathing tx.  8:08 PM Dr. Myna Hidalgo of [facility] returning page and will admit. Holding orders to be placed by admitting team. Please see their notes for further documentation of care. I appreciate their help with this pleasant pt's care. Pt stable at time of admission.     Final Clinical Impressions(s) / ED Diagnoses   Final diagnoses:  Severe persistent asthma with exacerbation  SOB (shortness of breath) on exertion  Essential hypertension  Wheezing    New Prescriptions New Prescriptions   No medications on [address], [place], [place] 12/21/16 2008    Duffy Bruce, MD 12/22/16 [phone]

## 2016-12-21 NOTE — ED Notes (Signed)
Respiratory aware of continuous neb order

## 2016-12-21 NOTE — Progress Notes (Signed)
Patient has order for CPAP. I brought CPAP in room and set it up placed it on her face and she felt like she was suffocating. Said she did not want to wear machine tonight. I left in room on standby if she changed her mind. RT will continue to monitor.

## 2016-12-21 NOTE — ED Triage Notes (Signed)
Pt c/o increased shortness of breath-- lungs diminished with fine wheezes audible. States feels worse than 3 days ago when here.

## 2016-12-21 NOTE — Discharge Instructions (Addendum)
Asthma, Acute Bronchospasm °Acute bronchospasm caused by asthma is also referred to as an asthma attack. Bronchospasm means your air passages become narrowed. The narrowing is caused by inflammation and tightening of the muscles in the air tubes (bronchi) in your lungs. This can make it hard to breathe or cause you to wheeze and cough. °What are the causes? °Possible triggers are: °· Animal dander from the skin, hair, or feathers of animals. °· Dust mites contained in house dust. °· Cockroaches. °· Pollen from trees or grass. °· Mold. °· Cigarette or tobacco smoke. °· Air pollutants such as dust, household cleaners, hair sprays, aerosol sprays, paint fumes, strong chemicals, or strong odors. °· Cold air or weather changes. Cold air may trigger inflammation. Winds increase molds and pollens in the air. °· Strong emotions such as crying or laughing hard. °· Stress. °· Certain medicines such as aspirin or beta-blockers. °· Sulfites in foods and drinks, such as dried fruits and wine. °· Infections or inflammatory conditions, such as a flu, cold, or inflammation of the nasal membranes (rhinitis). °· Gastroesophageal reflux disease (GERD). GERD is a condition where stomach acid backs up into your esophagus. °· Exercise or strenuous activity. ° °What are the signs or symptoms? °· Wheezing. °· Excessive coughing, particularly at night. °· Chest tightness. °· Shortness of breath. °How is this diagnosed? °Your health care provider will ask you about your medical history and perform a physical exam. A chest X-ray or blood testing may be performed to look for other causes of your symptoms or other conditions that may have triggered your asthma attack. °How is this treated? °Treatment is aimed at reducing inflammation and opening up the airways in your lungs. Most asthma attacks are treated with inhaled medicines. These include quick relief or rescue medicines (such as bronchodilators) and controller medicines (such as inhaled  corticosteroids). These medicines are sometimes given through an inhaler or a nebulizer. Systemic steroid medicine taken by mouth or given through an IV tube also can be used to reduce the inflammation when an attack is moderate or severe. Antibiotic medicines are only used if a bacterial infection is present. °Follow these instructions at home: °· Rest. °· Drink plenty of liquids. This helps the mucus to remain thin and be easily coughed up. Only use caffeine in moderation and do not use alcohol until you have recovered from your illness. °· Do not smoke. Avoid being exposed to secondhand smoke. °· You play a critical role in keeping yourself in good health. Avoid exposure to things that cause you to wheeze or to have breathing problems. °· Keep your medicines up-to-date and available. Carefully follow your health care provider’s treatment plan. °· Take your medicine exactly as prescribed. °· When pollen or pollution is bad, keep windows closed and use an air conditioner or go to places with air conditioning. °· Asthma requires careful medical care. See your health care provider for a follow-up as advised. If you are more than [redacted] weeks pregnant and you were prescribed any new medicines, let your obstetrician know about the visit and how you are doing. Follow up with your health care provider as directed. °· After you have recovered from your asthma attack, make an appointment with your outpatient doctor to talk about ways to reduce the likelihood of future attacks. If you do not have a doctor who manages your asthma, make an appointment with a primary care doctor to discuss your asthma. °Get help right away if: °· You are getting worse. °·   You have trouble breathing. If severe, call your local emergency services (911 in the [place] °· You develop chest pain or discomfort. °· You are vomiting. °· You are not able to keep fluids down. °· You are coughing up yellow, green, brown, or bloody sputum. °· You have a fever  and your symptoms suddenly get worse. °· You have trouble swallowing. °This information is not intended to replace advice given to you by your health care provider. Make sure you discuss any questions you have with your health care provider. °Document Released: 07/31/2006 Document Revised: 09/27/2015 Document Reviewed: 10/21/2012 °Elsevier Interactive Patient Education © 2017 [employer] ° °

## 2016-12-22 DIAGNOSIS — E119 Type 2 diabetes mellitus without complications: Secondary | ICD-10-CM

## 2016-12-22 DIAGNOSIS — R0602 Shortness of breath: Secondary | ICD-10-CM

## 2016-12-22 DIAGNOSIS — F411 Generalized anxiety disorder: Secondary | ICD-10-CM

## 2016-12-22 DIAGNOSIS — J4551 Severe persistent asthma with (acute) exacerbation: Secondary | ICD-10-CM | POA: Diagnosis not present

## 2016-12-22 DIAGNOSIS — I160 Hypertensive urgency: Secondary | ICD-10-CM | POA: Diagnosis not present

## 2016-12-22 DIAGNOSIS — D72829 Elevated white blood cell count, unspecified: Secondary | ICD-10-CM

## 2016-12-22 DIAGNOSIS — G4733 Obstructive sleep apnea (adult) (pediatric): Secondary | ICD-10-CM

## 2016-12-22 DIAGNOSIS — I152 Hypertension secondary to endocrine disorders: Secondary | ICD-10-CM | POA: Diagnosis not present

## 2016-12-22 DIAGNOSIS — E2609 Other primary hyperaldosteronism: Secondary | ICD-10-CM

## 2016-12-22 DIAGNOSIS — I10 Essential (primary) hypertension: Secondary | ICD-10-CM

## 2016-12-22 DIAGNOSIS — F5101 Primary insomnia: Secondary | ICD-10-CM

## 2016-12-22 LAB — CBC WITH DIFFERENTIAL/PLATELET
Basophils Absolute: 0 10*3/uL (ref 0.0–0.1)
Basophils Relative: 0 %
Eosinophils Absolute: 0 10*3/uL (ref 0.0–0.7)
Eosinophils Relative: 0 %
HEMATOCRIT: 37.8 % (ref 36.0–46.0)
HEMOGLOBIN: 11.8 g/dL — AB (ref 12.0–15.0)
LYMPHS ABS: 0.9 10*3/uL (ref 0.7–4.0)
LYMPHS PCT: 5 %
MCH: 26.9 pg (ref 26.0–34.0)
MCHC: 31.2 g/dL (ref 30.0–36.0)
MCV: 86.1 fL (ref 78.0–100.0)
MONO ABS: 0.3 10*3/uL (ref 0.1–1.0)
MONOS PCT: 2 %
NEUTROS ABS: 15.9 10*3/uL — AB (ref 1.7–7.7)
NEUTROS PCT: 93 %
Platelets: 268 10*3/uL (ref 150–400)
RBC: 4.39 MIL/uL (ref 3.87–5.11)
RDW: 20.5 % — AB (ref 11.5–15.5)
WBC: 17 10*3/uL — ABNORMAL HIGH (ref 4.0–10.5)

## 2016-12-22 LAB — BASIC METABOLIC PANEL
ANION GAP: 13 (ref 5–15)
BUN: 8 mg/dL (ref 6–20)
CHLORIDE: 102 mmol/L (ref 101–111)
CO2: 24 mmol/L (ref 22–32)
Calcium: 8.4 mg/dL — ABNORMAL LOW (ref 8.9–10.3)
Creatinine, Ser: 0.7 mg/dL (ref 0.44–1.00)
GFR calc Af Amer: >60 mL/min (ref >60)
GFR calc non Af Amer: >60 mL/min (ref >60)
GLUCOSE: 169 mg/dL — AB (ref 65–99)
POTASSIUM: 3.6 mmol/L (ref 3.5–5.1)
Sodium: 139 mmol/L (ref 135–145)

## 2016-12-22 LAB — GLUCOSE, CAPILLARY: Glucose-Capillary: 158 mg/dL — ABNORMAL HIGH (ref 65–99)

## 2016-12-22 MED ORDER — ZOLPIDEM TARTRATE 5 MG PO TABS: 5.0000 mg | ORAL_TABLET | Freq: Every evening | ORAL | 0 refills | Status: DC | PRN

## 2016-12-22 MED ORDER — TRAZODONE HCL 50 MG PO TABS: 25.0000 mg | ORAL_TABLET | Freq: Every evening | ORAL | 3 refills | Status: DC | PRN

## 2016-12-22 MED ORDER — PREDNISONE 20 MG PO TABS: ORAL_TABLET | ORAL | 0 refills | Status: DC

## 2016-12-22 NOTE — Progress Notes (Signed)
OT Cancellation Note  Patient Details Name: April [name] MRN: [id number] DOB: 22-Mar-1971   Cancelled Treatment:    Reason Eval/Treat Not Completed: OT screened, no needs identified, will sign off. Please re-consult if needs change. Thank you for this referral.  Binnie Kand M.S., OTR/L Pager: [phone]  12/22/2016, 11:33 AM

## 2016-12-22 NOTE — Discharge Summary (Signed)
Physician Discharge Summary  April [name] SEG:[id number] DOB: [birth year] DOA: 12/21/2016  PCP: Arnoldo Morale, MD  Admit date: 12/21/2016 Discharge date: 12/22/2016  Time spent: 45 minutes  Recommendations for Outpatient Follow-up:  Patient will be discharged to home.  Patient will need to follow up with primary care provider within one week of discharge, repeat CBC in one week. Follow up with pulmonologist for scheduled appointment. Patient should continue medications as prescribed.  Patient should follow a heart healthy/carb modified diet.   Discharge Diagnoses:  Asthma exacerbation Leukocytosis Obstructive sleep apnea Hypertension with hypertensive urgency Depression/anxiety/insomina Diabetes mellitus, type II Chronic diastolic heart failure  Discharge Condition: Stable  Diet recommendation: Heart healthy/carb modified  Filed Weights   12/21/16 0950  Weight: (!) 155.1 kg (342 lb)    History of present illness:  On 12/21/2016 by Ova Freshwater is a 46 y.o. female with medical history significant for persistent asthma with frequent exacerbation, type 2 diabetes mellitus, depression with anxiety, OSA, and hypertension, now presenting to the emergency department with worsening dyspnea and wheezing. Patient was hospitalized from 12/10/2016 until 12/13/2016 for asthma exacerbation in the setting of a right lower lobe pneumonia. She was discharged with Augmentin and prednisone taper. She began to experience worsening in her respiratory status several days ago and was seen in the emergency department on 12/18/2016 for this. She was treated with nebs and advised to complete her Augmentin and prednisone. She was discharged from the ED at that time and much improved condition. She now returns with worsening, now dyspneic at rest and with reported difficulty catching her breath. Denies fevers or chills and denies chest pain or palpitations. No increase in her chronic  swelling.  Hospital Course:  Asthma exacerbation -patient has persistent asthma and followed by pulmonology -Patient presented with progressive dyspnea at rest and wheezing -Chest x-ray unremarkable -Was given nebulizers in the emergency department with continuous albuterol, magnesium and Solu-Medrol, however remained very dyspneic at rest -Admitted and placed on nebulizer treatments, Solu-Medrol, and has improved to greater than expected -Will discharge patient with prednisone taper -Continue to brovana, proair, proventil, Tessalon, Pulmicort, Mucinex -Patient already has standing appointment with pulmonology  Leukocytosis -Suspect reactive to asthma exacerbation -WBC 21.8 on admission, currently 17 -Patient was recently treated for a right lower lobe pneumonia and was given a prednisone taper on her last hospitalization earlier this month -Patient was afebrile upon admission, chest x-ray unremarkable or infection -Repeat CBC in 1 week  Obstructive sleep apnea -Continue CPAP QHS  Hypertension with hypertensive urgency -Upon admission to emergency department, blood pressure was 184/106 -Continue home medications of Lopressor, losartan, amlodipine, spironolactone, Lasix  Depression/anxiety/insomina -Stable, Continue Zoloft, trazodone, ambien -advised patient to be cautious with using trazodone and Ambien concomitantly  Diabetes mellitus, type II -Hemoglobin A1c 5.8 on 12/11/16 -Continue metformin  Chronic diastolic heart failure -Echocardiogram 10/03/2016 should be 73-71%, grade 1 diastolic dysfunction -Continue home medications of spironolactone and Lasix, losartan, metoprolol  Procedures: None  Consultations: None  Discharge Exam: Vitals:   12/22/16 0634 12/22/16 1142  BP: (!) 165/92 (!) 157/83  Pulse: 72 70  Resp: 17   Temp: 97.8 F (36.6 C) 98 F (36.7 C)  SpO2: 100% 100%   Patient states her breathing has improved, no longer feeling short of breath or  coughing. Denies chest pain, abdominal pain, nausea vomiting, diarrhea or constipation, dizziness or headache. Agency she also has an upcoming appointment with her primary care doctor as well as pulmonologist.   General:  Well developed, well nourished, NAD, appears stated age  67: NCAT,mucous membranes moist.  Cardiovascular: S1 S2 auscultated, no rubs, murmurs or gallops. Regular rate and rhythm.  Respiratory: Clear to auscultation bilaterally with equal chest rise, no wheezing  Abdomen: Soft, obese, nontender, nondistended, + bowel sounds  Extremities: warm dry without cyanosis clubbing or edema  Neuro: AAOx3, nonfocal  Skin: Without rashes exudates or nodules  Psych: Normal affect and demeanor with intact judgement and insight  Discharge Instructions Discharge Instructions    Discharge instructions    Complete by:  As directed    Patient will be discharged to home.  Patient will need to follow up with primary care provider within one week of discharge, repeat CBC in one week. Follow up with pulmonologist for scheduled appointment. Patient should continue medications as prescribed.  Patient should follow a heart healthy/carb modified diet.     Current Discharge Medication List    START taking these medications   Details  predniSONE (DELTASONE) 20 MG tablet Take 3 tabs x 3 days, then 2 tabs x 3 days, then 1 tab x 3days Qty: 18 tablet, Refills: 0      CONTINUE these medications which have CHANGED   Details  traZODone (DESYREL) 50 MG tablet Take 0.5-1 tablets (25-50 mg total) by mouth at bedtime as needed for sleep. Qty: 30 tablet, Refills: 3   Associated Diagnoses: Primary insomnia    zolpidem (AMBIEN) 5 MG tablet Take 1 tablet (5 mg total) by mouth at bedtime as needed for sleep. Qty: 30 tablet, Refills: 0      CONTINUE these medications which have NOT CHANGED   Details  acetaminophen-codeine (TYLENOL #3) 300-30 MG tablet Take 1 tablet by mouth every 12 (twelve)  hours as needed for moderate pain. Qty: 30 tablet, Refills: 0   Associated Diagnoses: Cervical back pain with evidence of disc disease; Other hemorrhoids    albuterol (PROAIR HFA) 108 (90 Base) MCG/ACT inhaler Inhale 2 puffs into the lungs every 6 (six) hours as needed for wheezing or shortness of breath. Qty: 1 Inhaler, Refills: 12    albuterol (PROVENTIL) (2.5 MG/3ML) 0.083% nebulizer solution Take 3 mLs (2.5 mg total) by nebulization every 4 (four) hours as needed for wheezing or shortness of breath. Qty: 150 mL, Refills: 4   Associated Diagnoses: Asthma, chronic obstructive, without status asthmaticus (HCC)    amLODipine (NORVASC) 10 MG tablet Take 1 tablet (10 mg total) by mouth daily. Qty: 30 tablet, Refills: 4   Associated Diagnoses: Essential hypertension    arformoterol (BROVANA) 15 MCG/2ML NEBU Take 2 mLs (15 mcg total) by nebulization 2 (two) times daily. Qty: 120 mL, Refills: 12    benzonatate (TESSALON) 200 MG capsule Take 1 capsule (200 mg total) by mouth 3 (three) times daily as needed for cough. Qty: 30 capsule, Refills: 0    budesonide (PULMICORT) 0.25 MG/2ML nebulizer solution Take 2 mLs (0.25 mg total) by nebulization 2 (two) times daily. Qty: 60 mL, Refills: 12    butalbital-acetaminophen-caffeine (FIORICET, ESGIC) 50-325-40 MG tablet Take 1-2 tablets by mouth every 6 (six) hours as needed for headache. Qty: 20 tablet, Refills: 0    fluticasone (FLONASE) 50 MCG/ACT nasal spray Place 2 sprays into both nostrils daily. Qty: 16 g, Refills: 6    furosemide (LASIX) 20 MG tablet Take 1 tablet (20 mg total) by mouth daily. Qty: 30 tablet, Refills: 1    guaiFENesin (MUCINEX) 600 MG 12 hr tablet Take 1 tablet (600 mg total) by mouth 2 (  two) times daily. Qty: 30 tablet, Refills: 0    loratadine (CLARITIN) 10 MG tablet Take 1 tablet (10 mg total) by mouth daily. Qty: 90 tablet, Refills: 3   Associated Diagnoses: Asthma, chronic obstructive, without status asthmaticus  (HCC)    losartan (COZAAR) 100 MG tablet Take 1 tablet (100 mg total) by mouth daily. Qty: 30 tablet, Refills: 3    metFORMIN (GLUCOPHAGE) 500 MG tablet Take 1 tablet (500 mg total) by mouth 2 (two) times daily with a meal. Qty: 60 tablet, Refills: 3   Associated Diagnoses: Type 2 diabetes mellitus without complication, without long-term current use of insulin (HCC)    methocarbamol (ROBAXIN) 500 MG tablet Take 1 tablet (500 mg total) by mouth 2 (two) times daily. Qty: 60 tablet, Refills: 1   Associated Diagnoses: Neck pain    metoCLOPramide (REGLAN) 10 MG tablet Take 1 tablet (10 mg total) by mouth every 6 (six) hours. Qty: 30 tablet, Refills: 0    metoprolol tartrate (LOPRESSOR) 25 MG tablet Take 0.5 tablets (12.5 mg total) by mouth 2 (two) times daily. Qty: 60 tablet, Refills: 4    Multiple Vitamin (MULTIVITAMIN WITH MINERALS) TABS tablet Take 1 tablet by mouth daily. One-A-Day    nicotine (NICODERM CQ - DOSED IN MG/24 HOURS) 14 mg/24hr patch Place 1 patch (14 mg total) onto the skin daily. Qty: 28 patch, Refills: 0    omeprazole (PRILOSEC) 20 MG capsule Take 1 capsule (20 mg total) by mouth daily. Qty: 30 capsule, Refills: 11    senna-docusate (SENOKOT-S) 8.6-50 MG tablet Start 1 tab QHS, up to 2tabs BID. Max 4 tabs daily for constipation Qty: 15 tablet, Refills: 0    sertraline (ZOLOFT) 50 MG tablet TAKE 1 TABLET BY MOUTH DAILY Qty: 90 tablet, Refills: 0   Associated Diagnoses: Generalized anxiety disorder    spironolactone (ALDACTONE) 50 MG tablet Take 1 tablet (50 mg total) by mouth daily. Qty: 60 tablet, Refills: 2   Associated Diagnoses: Essential hypertension    diphenhydramine-acetaminophen (TYLENOL PM) 25-500 MG TABS tablet Take 1 tablet by mouth at bedtime as needed (for sleep).     lactobacillus (FLORANEX/LACTINEX) PACK Take 1 packet (1 g total) by mouth 3 (three) times daily with meals. Qty: 30 packet, Refills: 0       No Known Allergies Follow-up  Information    Arnoldo Morale, MD. Schedule an appointment as soon as possible for a visit in 1 week(s).   Specialty:  Family Medicine Why:  Hospital follow up Contact information: [place] [address] [phone]            The results of significant diagnostics from this hospitalization (including imaging, microbiology, ancillary and laboratory) are listed below for reference.    Significant Diagnostic Studies: Dg Chest 2 View  Result Date: 12/21/2016 CLINICAL DATA:  Asthma attack.  Wheezing. EXAM: CHEST  2 VIEW COMPARISON:  December 18, 2016 FINDINGS: The heart size and mediastinal contours are within normal limits. Both lungs are clear. The visualized skeletal structures are unremarkable. IMPRESSION: No active cardiopulmonary disease. Electronically Signed   By: Dorise Bullion III M.D   On: 12/21/2016 10:49   Dg Chest 2 View  Result Date: 12/18/2016 CLINICAL DATA:  Wheezing EXAM: CHEST  2 VIEW COMPARISON:  12/10/2016 FINDINGS: The heart size and mediastinal contours are within normal limits. Mild increase in interstitial prominence and slight peribronchial thickening seen to better advantage on the lateral view. Findings likely reflect chronic bronchitic change. No alveolar consolidations  are noted. Subtle ground-glass appearance of the lungs may reflect an alveolitis or pneumonitis. The visualized skeletal structures are unremarkable. IMPRESSION: Stable mild diffuse bronchitic change with faint ground-glass appearance of the lungs that may reflect an alveolitis/pneumonitis or possibly some hypoventilatory change of the lungs. Electronically Signed   By: Ashley Royalty M.D.   On: 12/18/2016 23:19   Dg Chest 2 View  Result Date: 12/10/2016 CLINICAL DATA:  One week history of asthmatic symptoms including shortness of breath. History of asthma -COPD. Former smoker. EXAM: CHEST  2 VIEW COMPARISON:  PA and lateral chest x-ray of November 13, 2016 FINDINGS: The lungs are  adequately inflated. There are slightly increased lung markings overlying the lower thoracic spine not clearly evident on the frontal view. There is no pleural effusion. The heart and pulmonary vascularity are normal. The trachea is midline. IMPRESSION: Chronic bronchitic-reactive airway changes. Atelectasis or early pneumonia posteriorly likely in the right lower lobe. Followup PA and lateral chest X-ray is recommended in 3-4 weeks following trial of antibiotic therapy to ensure resolution and exclude underlying malignancy. Electronically Signed   By: David  Martinique M.D.   On: 12/10/2016 14:37   Ct Head Wo Contrast  Result Date: 12/18/2016 CLINICAL DATA:  Acute onset was superior right-sided headache. Worst headache of life. EXAM: CT HEAD WITHOUT CONTRAST TECHNIQUE: Contiguous axial images were obtained from the base of the skull through the vertex without intravenous contrast. COMPARISON:  Head CT 10/20/2016 FINDINGS: Brain: No intracranial hemorrhage, mass effect, or midline shift. No hydrocephalus. The basilar cisterns are patent. No evidence of territorial infarct or acute ischemia. No extra-axial or intracranial fluid collection. Vascular: No hyperdense vessel or unexpected calcification. Skull: Normal. Negative for fracture or focal lesion. Sinuses/Orbits: Paranasal sinuses and mastoid air cells are clear. The visualized orbits are unremarkable. Other: None. IMPRESSION: Unremarkable noncontrast head CT. Electronically Signed   By: Jeb Levering M.D.   On: 12/18/2016 22:50    Microbiology: Recent Results (from the past 240 hour(s))  Culture, blood (routine x 2)     Status: None (Preliminary result)   Collection Time: 12/18/16  9:24 PM  Result Value Ref Range Status   Specimen Description BLOOD LEFT HAND  Final   Special Requests IN PEDIATRIC BOTTLE Blood Culture adequate volume  Final   Culture NO GROWTH 4 DAYS  Final   Report Status PENDING  Incomplete     Labs: Basic Metabolic  Panel:  Recent Labs Lab 12/18/16 2131 12/21/16 0952 12/22/16 0536  NA 138 139 139  K 3.5 3.6 3.6  CL 107 105 102  CO2 22 27 24   GLUCOSE 126* 99 169*  BUN 12 12 8   CREATININE 0.75 0.77 0.70  CALCIUM 8.5* 8.1* 8.4*   Liver Function Tests:  Recent Labs Lab 12/18/16 2131 12/21/16 0952  AST 19 22  ALT 24 33  ALKPHOS 50 49  BILITOT 0.3 0.4  PROT 5.9* 5.3*  ALBUMIN 3.0* 2.9*   No results for input(s): LIPASE, AMYLASE in the last 168 hours. No results for input(s): AMMONIA in the last 168 hours. CBC:  Recent Labs Lab 12/18/16 2131 12/21/16 0952 12/22/16 0536  WBC 18.9* 21.8* 17.0*  NEUTROABS 14.7* 15.9* 15.9*  HGB 12.0 11.9* 11.8*  HCT 37.4 37.9 37.8  MCV 84.6 84.4 86.1  PLT 283 282 268   Cardiac Enzymes: No results for input(s): CKTOTAL, CKMB, CKMBINDEX, TROPONINI in the last 168 hours. BNP: BNP (last 3 results)  Recent Labs  02/28/16 0949 06/21/16 1136 11/13/16 1926  BNP 21.4 37.3 43.7    ProBNP (last 3 results) No results for input(s): PROBNP in the last 8760 hours.  CBG:  Recent Labs Lab 12/21/16 2233 12/22/16 0802  GLUCAP 203* 158*       Signed:  Cristal Ford  [facility] 12/22/2016, 12:33 PM

## 2016-12-22 NOTE — Evaluation (Signed)
Physical Therapy Evaluation Patient Details Name: April [name] MRN: [id number] DOB: Dec 20, 1970 Today's Date: 12/22/2016   History of Present Illness  Pt is a 46 yo female admitted through ED on 12/21/16 with worsening wheezing and dyspnea and was diagnosed with a asthma exacerbation. Pt was recently admitted 8/14-8/17 for similar symptoms. PMH significant for asthma, Dm2, depression, anxiety, HTN, OSA.   Clinical Impression  Pt presents with the above diagnosis for therapy evaluation. Prior to admission, pt was completely independent and living alone in a single level apartment. Pt is able to perform all mobility with Mod I to independence this session with no LOB or instability with gait or transfers. No further acute PT needs are noted at this time. PT will sign off. Please re-order if any new needs arise.     Follow Up Recommendations No PT follow up    Equipment Recommendations  None recommended by PT    Recommendations for Other Services       Precautions / Restrictions Precautions Precautions: None Restrictions Weight Bearing Restrictions: No      Mobility  Bed Mobility Overal bed mobility: Independent             General bed mobility comments: pt coming out of bathroom when PT arrives  Transfers Overall transfer level: Independent                  Ambulation/Gait Ambulation/Gait assistance: Independent Ambulation Distance (Feet): 500 Feet Assistive device: None Gait Pattern/deviations: Step-through pattern Gait velocity: decreased Gait velocity interpretation: at or above normal speed for age/gender General Gait Details: Good sequencing, wider BOS, no LOB and no instability noted.   Stairs            Wheelchair Mobility    Modified Rankin (Stroke Patients Only)       Balance Overall balance assessment: Independent                                           Pertinent Vitals/Pain Pain Assessment: No/denies pain     Home Living Family/patient expects to be discharged to:: Private residence Living Arrangements: Alone Available Help at Discharge: Family Type of Home: Apartment Home Access: Level entry     Home Layout: One level Home Equipment: None      Prior Function Level of Independence: Independent               Hand Dominance   Dominant Hand: Right    Extremity/Trunk Assessment   Upper Extremity Assessment Upper Extremity Assessment: Overall WFL for tasks assessed    Lower Extremity Assessment Lower Extremity Assessment: Overall WFL for tasks assessed    Cervical / Trunk Assessment Cervical / Trunk Assessment: Normal  Communication   Communication: No difficulties  Cognition Arousal/Alertness: Awake/alert Behavior During Therapy: WFL for tasks assessed/performed Overall Cognitive Status: Within Functional Limits for tasks assessed                                        General Comments      Exercises     Assessment/Plan    PT Assessment Patent does not need any further PT services  PT Problem List         PT Treatment Interventions      PT Goals (Current goals can  be found in the Care Plan section)  Acute Rehab PT Goals Patient Stated Goal: to get home today PT Goal Formulation: All assessment and education complete, DC therapy    Frequency     Barriers to discharge        Co-evaluation               AM-PAC PT "6 Clicks" Daily Activity  Outcome Measure Difficulty turning over in bed (including adjusting bedclothes, sheets and blankets)?: None Difficulty moving from lying on back to sitting on the side of the bed? : None Difficulty sitting down on and standing up from a chair with arms (e.g., wheelchair, bedside commode, etc,.)?: None Help needed moving to and from a bed to chair (including a wheelchair)?: None Help needed walking in hospital room?: None Help needed climbing 3-5 steps with a railing? : None 6 Click  Score: 24    End of Session Equipment Utilized During Treatment: Gait belt Activity Tolerance: Patient tolerated treatment well Patient left: in chair;with call bell/phone within reach Nurse Communication: Mobility status PT Visit Diagnosis: Difficulty in walking, not elsewhere classified (R26.2)    Time: 5009-3818 PT Time Calculation (min) (ACUTE ONLY): 10 min   Charges:   PT Evaluation $PT Eval Low Complexity: 1 Low     PT G Codes:        Scheryl Marten PT, DPT  [phone]   Jacqulyn Liner Sloan Leiter 12/22/2016, 10:01 AM

## 2016-12-23 ENCOUNTER — Telehealth: Payer: Self-pay

## 2016-12-23 LAB — CULTURE, BLOOD (ROUTINE X 2)
Culture: NO GROWTH
Special Requests: ADEQUATE

## 2016-12-23 NOTE — Telephone Encounter (Signed)
Call placed to [facility] and spoke to [place] who confirmed receipt of the [facility] referral and noted that an assessment has been scheduled for 12/25/16 @ 1430.

## 2016-12-24 ENCOUNTER — Other Ambulatory Visit: Payer: Medicaid Other

## 2016-12-25 ENCOUNTER — Telehealth: Payer: Self-pay

## 2016-12-25 NOTE — Telephone Encounter (Signed)
[facility] Post-discharge Follow-Up Phone Call:  Date of Discharge:  12/22/2016 Principal Discharge Diagnosis(es): asthma exacerbation Post-discharge Communication: (Clearly document all attempts clearly and date contact made) call placed to # [phone] and a HIPAA compliant voicemail message was left requesting a call back to # [phone] Call Completed: No         Pt has an appointment at [facility] on 12/27/16 @ 1500.

## 2016-12-27 ENCOUNTER — Ambulatory Visit: Payer: Medicaid Other | Attending: Family Medicine | Admitting: Family Medicine

## 2016-12-27 ENCOUNTER — Encounter: Payer: Self-pay | Admitting: Family Medicine

## 2016-12-27 VITALS — BP 138/82 | HR 86 | Temp 98.6°F | Ht 66.0 in | Wt 339.2 lb

## 2016-12-27 DIAGNOSIS — I10 Essential (primary) hypertension: Secondary | ICD-10-CM | POA: Diagnosis present

## 2016-12-27 DIAGNOSIS — Z7984 Long term (current) use of oral hypoglycemic drugs: Secondary | ICD-10-CM | POA: Diagnosis not present

## 2016-12-27 DIAGNOSIS — B9681 Helicobacter pylori [H. pylori] as the cause of diseases classified elsewhere: Secondary | ICD-10-CM | POA: Insufficient documentation

## 2016-12-27 DIAGNOSIS — F32A Depression, unspecified: Secondary | ICD-10-CM

## 2016-12-27 DIAGNOSIS — R7303 Prediabetes: Secondary | ICD-10-CM

## 2016-12-27 DIAGNOSIS — F329 Major depressive disorder, single episode, unspecified: Secondary | ICD-10-CM

## 2016-12-27 DIAGNOSIS — R251 Tremor, unspecified: Secondary | ICD-10-CM | POA: Insufficient documentation

## 2016-12-27 DIAGNOSIS — J4541 Moderate persistent asthma with (acute) exacerbation: Secondary | ICD-10-CM

## 2016-12-27 DIAGNOSIS — B9689 Other specified bacterial agents as the cause of diseases classified elsewhere: Secondary | ICD-10-CM | POA: Diagnosis not present

## 2016-12-27 DIAGNOSIS — K219 Gastro-esophageal reflux disease without esophagitis: Secondary | ICD-10-CM | POA: Diagnosis not present

## 2016-12-27 DIAGNOSIS — N920 Excessive and frequent menstruation with regular cycle: Secondary | ICD-10-CM | POA: Insufficient documentation

## 2016-12-27 DIAGNOSIS — J189 Pneumonia, unspecified organism: Secondary | ICD-10-CM | POA: Diagnosis not present

## 2016-12-27 DIAGNOSIS — F419 Anxiety disorder, unspecified: Secondary | ICD-10-CM

## 2016-12-27 DIAGNOSIS — G4700 Insomnia, unspecified: Secondary | ICD-10-CM | POA: Diagnosis not present

## 2016-12-27 DIAGNOSIS — Z6841 Body Mass Index (BMI) 40.0 and over, adult: Secondary | ICD-10-CM | POA: Diagnosis not present

## 2016-12-27 DIAGNOSIS — G4733 Obstructive sleep apnea (adult) (pediatric): Secondary | ICD-10-CM | POA: Diagnosis not present

## 2016-12-27 DIAGNOSIS — J449 Chronic obstructive pulmonary disease, unspecified: Secondary | ICD-10-CM | POA: Insufficient documentation

## 2016-12-27 DIAGNOSIS — D72829 Elevated white blood cell count, unspecified: Secondary | ICD-10-CM | POA: Diagnosis not present

## 2016-12-27 DIAGNOSIS — L83 Acanthosis nigricans: Secondary | ICD-10-CM | POA: Insufficient documentation

## 2016-12-27 DIAGNOSIS — J45909 Unspecified asthma, uncomplicated: Secondary | ICD-10-CM | POA: Diagnosis not present

## 2016-12-27 DIAGNOSIS — J441 Chronic obstructive pulmonary disease with (acute) exacerbation: Secondary | ICD-10-CM | POA: Insufficient documentation

## 2016-12-27 DIAGNOSIS — E119 Type 2 diabetes mellitus without complications: Secondary | ICD-10-CM | POA: Insufficient documentation

## 2016-12-27 DIAGNOSIS — D72828 Other elevated white blood cell count: Secondary | ICD-10-CM | POA: Diagnosis not present

## 2016-12-27 DIAGNOSIS — T380X5A Adverse effect of glucocorticoids and synthetic analogues, initial encounter: Secondary | ICD-10-CM | POA: Diagnosis not present

## 2016-12-27 LAB — GLUCOSE, POCT (MANUAL RESULT ENTRY): POC GLUCOSE: 117 mg/dL — AB (ref 70–99)

## 2016-12-27 MED ORDER — SERTRALINE HCL 100 MG PO TABS: 100.0000 mg | ORAL_TABLET | Freq: Every day | ORAL | 3 refills | Status: DC

## 2016-12-27 MED ORDER — METOPROLOL TARTRATE 25 MG PO TABS: 25.0000 mg | ORAL_TABLET | Freq: Two times a day (BID) | ORAL | 4 refills | Status: DC

## 2016-12-27 NOTE — Progress Notes (Signed)
Transitional care clinic  Hospitalization dates: 8/14 - 12/13/16;  8/25 - 12/22/16  Subjective:  Patient ID: April [name], female    DOB: 04-28-1971  Age: 46 y.o. MRN: [id number]  CC: Asthma   HPI April [name] is a 46 year old female with a history of morbid obesity, major depressive disorder, hypertension, type 2 diabetes mellitus (A1c 5.8), asthma ( receiving Omalizumab injections from Pulmonary), recent hospitalization for asthma exacerbation and right lower lobe pneumonia (8/14-8/17/18) here for a follow-up from hospitalization at the transitional care clinic.  She had completed a course of Augmentin and prednisone dose taper but had persisting shortness of breath which led to presenting to the ED and she was found to be hypoxic. Chest x-ray reveals no active cardiopulmonary disease and she was commenced on nebulizers, IV Solu-Medrol, magnesium sulfate with subsequent improvement in her condition. She was discharged on a prednisone dose taper.  Today she complains of slight wheezing but no shortness of breath; she states she feels better than when she presented to the hospital. Of note she is yet to pick up her prednisone prescriptions due to concerns about weight gain and the fact that this will affect her ability to obtain a gastric sleeve. Her asthma is triggered by exposure to grass, fumes from cooking. She has an upcoming appointment with pulmonology on 01/01/17.  She complains of tremors on outstretched hand which she has noticed over the last few weeks; review of her thyroid labs revealed a normal level from 2012. She does not drink alcohol.  Her depression and anxiety have worsened and this has been exacerbated by her medical conditions, ongoing family stressors as she is worried about her daughter; she notices her heart racing and high anxiety levels increasing lately. Symptoms are uncontrolled on Zoloft.     Past Medical History:  Diagnosis Date  . Acanthosis nigricans   .  Arthritis   . Asthma    Followed by Dr. Annamaria Boots (pulmonology); receives every other week omalizumab injections; has frequent exacerbations  . COPD (chronic obstructive pulmonary disease) ([place])    PFTs in 2002, FEV1/FVC 65, no post bronchodilater test done  . Depression   . GERD (gastroesophageal reflux disease)   . Headache(784.0)   . Helicobacter pylori (H. pylori) infection   . Hypertension, essential   . Insomnia   . Menorrhagia   . Morbid obesity ([place])   . Obesity   . Seasonal allergies   . Shortness of breath   . Sleep apnea    Sleep study 2008 - mild OSA, not enough events to titrate CPAP  . Tobacco user     Past Surgical History:  Procedure Laterality Date  . BREAST REDUCTION SURGERY  09/2011  . TUBAL LIGATION  1996   bilateral    No Known Allergies   Outpatient Medications Prior to Visit  Medication Sig Dispense Refill  . acetaminophen-codeine (TYLENOL #3) 300-30 MG tablet Take 1 tablet by mouth every 12 (twelve) hours as needed for moderate pain. 30 tablet 0  . albuterol (PROAIR HFA) 108 (90 Base) MCG/ACT inhaler Inhale 2 puffs into the lungs every 6 (six) hours as needed for wheezing or shortness of breath. 1 Inhaler 12  . albuterol (PROVENTIL) (2.5 MG/3ML) 0.083% nebulizer solution Take 3 mLs (2.5 mg total) by nebulization every 4 (four) hours as needed for wheezing or shortness of breath. 150 mL 4  . amLODipine (NORVASC) 10 MG tablet Take 1 tablet (10 mg total) by mouth daily. 30 tablet 4  .  arformoterol (BROVANA) 15 MCG/2ML NEBU Take 2 mLs (15 mcg total) by nebulization 2 (two) times daily. 120 mL 12  . benzonatate (TESSALON) 200 MG capsule Take 1 capsule (200 mg total) by mouth 3 (three) times daily as needed for cough. 30 capsule 0  . budesonide (PULMICORT) 0.25 MG/2ML nebulizer solution Take 2 mLs (0.25 mg total) by nebulization 2 (two) times daily. 60 mL 12  . butalbital-acetaminophen-caffeine (FIORICET, ESGIC) 50-325-40 MG tablet Take 1-2 tablets by mouth every  6 (six) hours as needed for headache. 20 tablet 0  . diphenhydramine-acetaminophen (TYLENOL PM) 25-500 MG TABS tablet Take 1 tablet by mouth at bedtime as needed (for sleep).     . fluticasone (FLONASE) 50 MCG/ACT nasal spray Place 2 sprays into both nostrils daily. 16 g 6  . furosemide (LASIX) 20 MG tablet Take 1 tablet (20 mg total) by mouth daily. 30 tablet 1  . guaiFENesin (MUCINEX) 600 MG 12 hr tablet Take 1 tablet (600 mg total) by mouth 2 (two) times daily. 30 tablet 0  . loratadine (CLARITIN) 10 MG tablet Take 1 tablet (10 mg total) by mouth daily. 90 tablet 3  . losartan (COZAAR) 100 MG tablet Take 1 tablet (100 mg total) by mouth daily. 30 tablet 3  . metFORMIN (GLUCOPHAGE) 500 MG tablet Take 1 tablet (500 mg total) by mouth 2 (two) times daily with a meal. 60 tablet 3  . methocarbamol (ROBAXIN) 500 MG tablet Take 1 tablet (500 mg total) by mouth 2 (two) times daily. (Patient taking differently: Take 500 mg by mouth 2 (two) times daily as needed for muscle spasms. ) 60 tablet 1  . metoCLOPramide (REGLAN) 10 MG tablet Take 1 tablet (10 mg total) by mouth every 6 (six) hours. 30 tablet 0  . Multiple Vitamin (MULTIVITAMIN WITH MINERALS) TABS tablet Take 1 tablet by mouth daily. One-A-Day    . nicotine (NICODERM CQ - DOSED IN MG/24 HOURS) 14 mg/24hr patch Place 1 patch (14 mg total) onto the skin daily. (Patient taking differently: Place 14 mg onto the skin daily as needed (smoking cessation). ) 28 patch 0  . predniSONE (DELTASONE) 20 MG tablet Take 3 tabs x 3 days, then 2 tabs x 3 days, then 1 tab x 3days 18 tablet 0  . senna-docusate (SENOKOT-S) 8.6-50 MG tablet Start 1 tab QHS, up to 2tabs BID. Max 4 tabs daily for constipation (Patient taking differently: Take 1-2 tablets by mouth daily as needed for mild constipation. ) 15 tablet 0  . spironolactone (ALDACTONE) 50 MG tablet Take 1 tablet (50 mg total) by mouth daily. 60 tablet 2  . traZODone (DESYREL) 50 MG tablet Take 0.5-1 tablets (25-50  mg total) by mouth at bedtime as needed for sleep. 30 tablet 3  . zolpidem (AMBIEN) 5 MG tablet Take 1 tablet (5 mg total) by mouth at bedtime as needed for sleep. 30 tablet 0  . metoprolol tartrate (LOPRESSOR) 25 MG tablet Take 0.5 tablets (12.5 mg total) by mouth 2 (two) times daily. 60 tablet 4  . sertraline (ZOLOFT) 50 MG tablet TAKE 1 TABLET BY MOUTH DAILY (Patient taking differently: Take 50 mg by mouth once a day) 90 tablet 0  . lactobacillus (FLORANEX/LACTINEX) PACK Take 1 packet (1 g total) by mouth 3 (three) times daily with meals. (Patient not taking: Reported on 12/21/2016) 30 packet 0  . omeprazole (PRILOSEC) 20 MG capsule Take 1 capsule (20 mg total) by mouth daily. (Patient taking differently: Take 20 mg by mouth 2 (two)  times daily. ) 30 capsule 11   No facility-administered medications prior to visit.     ROS Review of Systems Constitutional: Negative for activity change, appetite change and fatigue.  HENT: Negative for congestion, sinus pressure and sore throat.   Eyes: Negative for visual disturbance.  Respiratory:. Negative for chest tightness, Cough, shortness of breath. Positive for wheezing   Cardiovascular: Negative for chest pain and palpitations.  Gastrointestinal: Negative for abdominal distention, abdominal pain and constipation.  Endocrine: Negative for polydipsia.  Genitourinary: Negative for dysuria and frequency.  Musculoskeletal:       See history of present illness  Skin: Negative for rash.  Neurological: Negative for tremors, light-headedness and numbness.  Hematological: Does not bruise/bleed easily.  Psychiatric/Behavioral: Positive for anxiety, depression, negative for suicidal ideations.  Objective:  BP 138/82   Pulse 86   Temp 98.6 F (37 C) (Oral)   Ht 5\' 6"  (1.676 m)   Wt (!) 339 lb 3.2 oz (153.9 kg)   SpO2 97%   BMI 54.75 kg/m   BP/Weight 12/27/2016 12/22/2016 0/62/6948  Systolic BP 546 270 -  Diastolic BP 82 83 -  Wt. (Lbs) 339.2 - 342   BMI 54.75 - 56.91      Physical Exam Constitutional: She is oriented to person, place, and time. She appears well-developed and well-nourished. No distress.  Neck: No JVD present.  Cardiovascular: Normal rate, normal heart sounds and intact distal pulses.   No murmur heard. Pulmonary/Chest: Effort normal. No accessory muscle usage. Normal breath sounds, Slight bilateral expiratory wheezing  Abdominal: Soft. Bowel sounds are normal. She exhibits no distension and no mass. There is no tenderness.  Musculoskeletal: Normal range of motion. She exhibits tenderness (tenderness on palpation of the medial aspect of left knee and on flexion-extension).  Neurological: She is alert and oriented to person, place, and time.  Skin: Skin is warm and dry.  Psychiatric: She has a normal mood and affect.   Lab Results  Component Value Date   HGBA1C 5.8 (H) 12/11/2016    Assessment & Plan:   1. Prediabetes Controlled with A1c of 5.8 Continue metformin - POCT glucose (manual entry)  2. Anxiety and depression Uncontrolled Exacerbated by frequent hospitalizations and underlying Family stressors. Increased Zoloft Referred to LCSW for psychotherapy - unable to be seen at this time but she will be scheduled - sertraline (ZOLOFT) 100 MG tablet; Take 1 tablet (100 mg total) by mouth daily.  Dispense: 60 tablet; Refill: 3  3. Tremor We'll need to exclude underlying thyroid disorder Could be secondary to prednisone - TSH  4. Moderate persistent asthma with exacerbation Uncontrolled with frequent hospitalizations She is yet to pick up prescription for prednisone which she was discharged on-advised to do so Continue Pulmicort nebs, Brovana, Xolair Keep upcoming appointment with pulmonary  5. Morbid obesity with body mass index of 50.0-59.9 in adult [facility]) He has been unable to exercise due to her knee pain Advised to reduce portion sizes and exercise as tolerated  6. Other elevated white blood  cell (WBC) count Secondary to steroid use We'll monitor - CBC with Differential  7. Essential hypertension Slightly elevated above goal of 130/80 Increase metoprolol dose - metoprolol tartrate (LOPRESSOR) 25 MG tablet; Take 1 tablet (25 mg total) by mouth 2 (two) times daily.  Dispense: 60 tablet; Refill: 4   Meds ordered this encounter  Medications  . sertraline (ZOLOFT) 100 MG tablet    Sig: Take 1 tablet (100 mg total) by mouth daily.  Dispense:  60 tablet    Refill:  3    Discontinue previous dose  . metoprolol tartrate (LOPRESSOR) 25 MG tablet    Sig: Take 1 tablet (25 mg total) by mouth 2 (two) times daily.    Dispense:  60 tablet    Refill:  4    Discontinue previous dose    Follow-up: Return for Follow-up of asthma, keep previously scheduled appointment.   Arnoldo Morale MD

## 2016-12-28 LAB — CBC WITH DIFFERENTIAL/PLATELET
BASOS: 0 %
Basophils Absolute: 0 10*3/uL (ref 0.0–0.2)
EOS (ABSOLUTE): 0 10*3/uL (ref 0.0–0.4)
Eos: 0 %
HEMATOCRIT: 41.6 % (ref 34.0–46.6)
Hemoglobin: 12.9 g/dL (ref 11.1–15.9)
IMMATURE GRANS (ABS): 0.5 10*3/uL — AB (ref 0.0–0.1)
IMMATURE GRANULOCYTES: 2 %
Lymphocytes Absolute: 3.8 10*3/uL — ABNORMAL HIGH (ref 0.7–3.1)
Lymphs: 14 %
MCH: 26.8 pg (ref 26.6–33.0)
MCHC: 31 g/dL — ABNORMAL LOW (ref 31.5–35.7)
MCV: 87 fL (ref 79–97)
MONOS ABS: 2.5 10*3/uL — AB (ref 0.1–0.9)
Monocytes: 9 %
NEUTROS ABS: 19.6 10*3/uL — AB (ref 1.4–7.0)
Neutrophils: 75 %
Platelets: 258 10*3/uL (ref 150–379)
RBC: 4.81 x10E6/uL (ref 3.77–5.28)
RDW: 20.9 % — AB (ref 12.3–15.4)
WBC: 26.4 10*3/uL (ref 3.4–10.8)

## 2016-12-28 LAB — TSH: TSH: 2.47 u[IU]/mL (ref 0.450–4.500)

## 2016-12-31 ENCOUNTER — Telehealth: Payer: Self-pay | Admitting: Family Medicine

## 2016-12-31 NOTE — Telephone Encounter (Signed)
Pt was called and informed of lab results. 

## 2016-12-31 NOTE — Telephone Encounter (Signed)
Pt was called and a vm was left informing pt to return phone call for lab results.

## 2016-12-31 NOTE — Telephone Encounter (Signed)
Pt called to request the lab result, please call her back

## 2016-12-31 NOTE — Telephone Encounter (Signed)
Pt will be called once results are in.

## 2017-01-01 ENCOUNTER — Observation Stay (HOSPITAL_COMMUNITY)
Admission: EM | Admit: 2017-01-01 | Discharge: 2017-01-02 | Disposition: A | Discharge status: Home / Self Care | Payer: Medicaid Other | Attending: Family Medicine | Admitting: Family Medicine

## 2017-01-01 ENCOUNTER — Encounter (HOSPITAL_COMMUNITY): Payer: Self-pay | Admitting: Emergency Medicine

## 2017-01-01 ENCOUNTER — Emergency Department (HOSPITAL_COMMUNITY): Payer: Medicaid Other

## 2017-01-01 ENCOUNTER — Other Ambulatory Visit: Payer: Self-pay

## 2017-01-01 ENCOUNTER — Inpatient Hospital Stay: Payer: Medicaid Other | Admitting: Pulmonary Disease

## 2017-01-01 ENCOUNTER — Institutional Professional Consult (permissible substitution): Payer: Medicaid Other | Admitting: Licensed Clinical Social Worker

## 2017-01-01 ENCOUNTER — Encounter: Payer: Self-pay | Admitting: Licensed Clinical Social Worker

## 2017-01-01 ENCOUNTER — Telehealth: Payer: Self-pay | Admitting: Internal Medicine

## 2017-01-01 DIAGNOSIS — I10 Essential (primary) hypertension: Secondary | ICD-10-CM | POA: Diagnosis present

## 2017-01-01 DIAGNOSIS — E119 Type 2 diabetes mellitus without complications: Secondary | ICD-10-CM | POA: Insufficient documentation

## 2017-01-01 DIAGNOSIS — E662 Morbid (severe) obesity with alveolar hypoventilation: Secondary | ICD-10-CM | POA: Diagnosis not present

## 2017-01-01 DIAGNOSIS — Z720 Tobacco use: Secondary | ICD-10-CM | POA: Diagnosis present

## 2017-01-01 DIAGNOSIS — G4733 Obstructive sleep apnea (adult) (pediatric): Secondary | ICD-10-CM | POA: Diagnosis present

## 2017-01-01 DIAGNOSIS — J449 Chronic obstructive pulmonary disease, unspecified: Secondary | ICD-10-CM | POA: Insufficient documentation

## 2017-01-01 DIAGNOSIS — Z9989 Dependence on other enabling machines and devices: Secondary | ICD-10-CM | POA: Insufficient documentation

## 2017-01-01 DIAGNOSIS — Z79899 Other long term (current) drug therapy: Secondary | ICD-10-CM | POA: Diagnosis not present

## 2017-01-01 DIAGNOSIS — K219 Gastro-esophageal reflux disease without esophagitis: Secondary | ICD-10-CM | POA: Diagnosis not present

## 2017-01-01 DIAGNOSIS — F32A Depression, unspecified: Secondary | ICD-10-CM | POA: Diagnosis present

## 2017-01-01 DIAGNOSIS — F411 Generalized anxiety disorder: Secondary | ICD-10-CM

## 2017-01-01 DIAGNOSIS — J4541 Moderate persistent asthma with (acute) exacerbation: Secondary | ICD-10-CM | POA: Diagnosis not present

## 2017-01-01 DIAGNOSIS — F339 Major depressive disorder, recurrent, unspecified: Secondary | ICD-10-CM | POA: Insufficient documentation

## 2017-01-01 DIAGNOSIS — Z87891 Personal history of nicotine dependence: Secondary | ICD-10-CM | POA: Diagnosis not present

## 2017-01-01 DIAGNOSIS — Z6841 Body Mass Index (BMI) 40.0 and over, adult: Secondary | ICD-10-CM | POA: Insufficient documentation

## 2017-01-01 DIAGNOSIS — M1990 Unspecified osteoarthritis, unspecified site: Secondary | ICD-10-CM | POA: Insufficient documentation

## 2017-01-01 DIAGNOSIS — Z9114 Patient's other noncompliance with medication regimen: Secondary | ICD-10-CM | POA: Insufficient documentation

## 2017-01-01 DIAGNOSIS — E876 Hypokalemia: Secondary | ICD-10-CM | POA: Insufficient documentation

## 2017-01-01 DIAGNOSIS — Z7984 Long term (current) use of oral hypoglycemic drugs: Secondary | ICD-10-CM | POA: Diagnosis not present

## 2017-01-01 DIAGNOSIS — D72829 Elevated white blood cell count, unspecified: Secondary | ICD-10-CM | POA: Insufficient documentation

## 2017-01-01 DIAGNOSIS — G4700 Insomnia, unspecified: Secondary | ICD-10-CM | POA: Insufficient documentation

## 2017-01-01 DIAGNOSIS — F329 Major depressive disorder, single episode, unspecified: Secondary | ICD-10-CM | POA: Diagnosis present

## 2017-01-01 DIAGNOSIS — J4551 Severe persistent asthma with (acute) exacerbation: Principal | ICD-10-CM

## 2017-01-01 DIAGNOSIS — F419 Anxiety disorder, unspecified: Secondary | ICD-10-CM

## 2017-01-01 LAB — CBC WITH DIFFERENTIAL/PLATELET
BASOS PCT: 0 %
Basophils Absolute: 0 10*3/uL (ref 0.0–0.1)
EOS PCT: 1 %
Eosinophils Absolute: 0.2 10*3/uL (ref 0.0–0.7)
HCT: 37.9 % (ref 36.0–46.0)
HEMOGLOBIN: 11.8 g/dL — AB (ref 12.0–15.0)
Lymphocytes Relative: 24 %
Lymphs Abs: 4 10*3/uL (ref 0.7–4.0)
MCH: 27.5 pg (ref 26.0–34.0)
MCHC: 31.1 g/dL (ref 30.0–36.0)
MCV: 88.3 fL (ref 78.0–100.0)
MONO ABS: 1.3 10*3/uL — AB (ref 0.1–1.0)
Monocytes Relative: 8 %
NEUTROS ABS: 11.1 10*3/uL — AB (ref 1.7–7.7)
NEUTROS PCT: 67 %
PLATELETS: 265 10*3/uL (ref 150–400)
RBC: 4.29 MIL/uL (ref 3.87–5.11)
RDW: 21.1 % — ABNORMAL HIGH (ref 11.5–15.5)
WBC: 16.6 10*3/uL — ABNORMAL HIGH (ref 4.0–10.5)

## 2017-01-01 LAB — URINALYSIS, ROUTINE W REFLEX MICROSCOPIC
BILIRUBIN URINE: NEGATIVE
Glucose, UA: 50 mg/dL — AB
Hgb urine dipstick: NEGATIVE
KETONES UR: NEGATIVE mg/dL
LEUKOCYTES UA: NEGATIVE
Nitrite: NEGATIVE
Protein, ur: NEGATIVE mg/dL
SPECIFIC GRAVITY, URINE: 1.012 (ref 1.005–1.030)
pH: 5 (ref 5.0–8.0)

## 2017-01-01 LAB — BASIC METABOLIC PANEL
Anion gap: 8 (ref 5–15)
BUN: 7 mg/dL (ref 6–20)
CHLORIDE: 107 mmol/L (ref 101–111)
CO2: 25 mmol/L (ref 22–32)
CREATININE: 0.8 mg/dL (ref 0.44–1.00)
Calcium: 7.7 mg/dL — ABNORMAL LOW (ref 8.9–10.3)
GFR calc Af Amer: >60 mL/min (ref >60)
GFR calc non Af Amer: >60 mL/min (ref >60)
GLUCOSE: 111 mg/dL — AB (ref 65–99)
POTASSIUM: 3.2 mmol/L — AB (ref 3.5–5.1)
Sodium: 140 mmol/L (ref 135–145)

## 2017-01-01 LAB — MAGNESIUM: MAGNESIUM: 1.8 mg/dL (ref 1.7–2.4)

## 2017-01-01 MED ORDER — MONTELUKAST SODIUM 10 MG PO TABS
10.0000 mg | ORAL_TABLET | Freq: Every day | ORAL | Status: DC
  Administered 2017-01-01: 10 mg via ORAL
  Filled 2017-01-01: qty 1

## 2017-01-01 MED ORDER — ACETAMINOPHEN-CODEINE #3 300-30 MG PO TABS
1.0000 | ORAL_TABLET | Freq: Two times a day (BID) | ORAL | Status: DC | PRN
  Administered 2017-01-01: 1 via ORAL
  Filled 2017-01-01: qty 1

## 2017-01-01 MED ORDER — NICOTINE 14 MG/24HR TD PT24
14.0000 mg | MEDICATED_PATCH | Freq: Every day | TRANSDERMAL | Status: DC | PRN
  Administered 2017-01-01: 14 mg via TRANSDERMAL
  Filled 2017-01-01: qty 1

## 2017-01-01 MED ORDER — FUROSEMIDE 20 MG PO TABS
20.0000 mg | ORAL_TABLET | Freq: Every day | ORAL | Status: DC
  Administered 2017-01-01 – 2017-01-02 (×2): 20 mg via ORAL
  Filled 2017-01-01 (×2): qty 1

## 2017-01-01 MED ORDER — IPRATROPIUM BROMIDE 0.02 % IN SOLN
RESPIRATORY_TRACT | Status: AC
  Filled 2017-01-01: qty 2.5

## 2017-01-01 MED ORDER — LORATADINE 10 MG PO TABS
10.0000 mg | ORAL_TABLET | Freq: Every day | ORAL | Status: DC
Start: 2017-01-01 — End: 2017-01-02
  Administered 2017-01-01 – 2017-01-02 (×2): 10 mg via ORAL
  Filled 2017-01-01 (×2): qty 1

## 2017-01-01 MED ORDER — SERTRALINE HCL 100 MG PO TABS
100.0000 mg | ORAL_TABLET | Freq: Every day | ORAL | Status: DC
  Administered 2017-01-01 – 2017-01-02 (×2): 100 mg via ORAL
  Filled 2017-01-01 (×2): qty 1

## 2017-01-01 MED ORDER — FLUTICASONE PROPIONATE 50 MCG/ACT NA SUSP
2.0000 | Freq: Every day | NASAL | Status: DC
  Administered 2017-01-01 – 2017-01-02 (×2): 2 via NASAL
  Filled 2017-01-01: qty 16

## 2017-01-01 MED ORDER — BENZONATATE 100 MG PO CAPS: 200.0000 mg | ORAL_CAPSULE | Freq: Three times a day (TID) | ORAL | Status: DC | PRN

## 2017-01-01 MED ORDER — ACETAMINOPHEN 325 MG PO TABS: 650.0000 mg | ORAL_TABLET | Freq: Four times a day (QID) | ORAL | Status: DC | PRN

## 2017-01-01 MED ORDER — ZOLPIDEM TARTRATE 5 MG PO TABS
5.0000 mg | ORAL_TABLET | Freq: Every evening | ORAL | Status: DC | PRN
  Administered 2017-01-01: 5 mg via ORAL
  Filled 2017-01-01 (×2): qty 1

## 2017-01-01 MED ORDER — ONDANSETRON HCL 4 MG PO TABS
4.0000 mg | ORAL_TABLET | Freq: Four times a day (QID) | ORAL | Status: DC | PRN
Start: 2017-01-01 — End: 2017-01-02

## 2017-01-01 MED ORDER — METOCLOPRAMIDE HCL 10 MG PO TABS: 10.0000 mg | ORAL_TABLET | Freq: Four times a day (QID) | ORAL | Status: DC | PRN

## 2017-01-01 MED ORDER — METHYLPREDNISOLONE SODIUM SUCC 125 MG IJ SOLR
125.0000 mg | Freq: Once | INTRAMUSCULAR | Status: AC
  Administered 2017-01-01: 125 mg via INTRAVENOUS
  Filled 2017-01-01: qty 2

## 2017-01-01 MED ORDER — HYDROCODONE-ACETAMINOPHEN 5-325 MG PO TABS
1.0000 | ORAL_TABLET | ORAL | Status: DC | PRN
  Administered 2017-01-01: 2 via ORAL
  Administered 2017-01-01: 1 via ORAL
  Administered 2017-01-02: 2 via ORAL
  Filled 2017-01-01: qty 1
  Filled 2017-01-01 (×2): qty 2

## 2017-01-01 MED ORDER — IPRATROPIUM BROMIDE 0.02 % IN SOLN
0.5000 mg | RESPIRATORY_TRACT | Status: DC
  Administered 2017-01-01: 0.5 mg via RESPIRATORY_TRACT
  Filled 2017-01-01: qty 2.5

## 2017-01-01 MED ORDER — ENOXAPARIN SODIUM 40 MG/0.4ML ~~LOC~~ SOLN
40.0000 mg | SUBCUTANEOUS | Status: DC
  Administered 2017-01-01: 40 mg via SUBCUTANEOUS
  Filled 2017-01-01: qty 0.4

## 2017-01-01 MED ORDER — MAGNESIUM SULFATE 2 GM/50ML IV SOLN
2.0000 g | Freq: Once | INTRAVENOUS | Status: AC
  Administered 2017-01-01: 2 g via INTRAVENOUS
  Filled 2017-01-01: qty 50

## 2017-01-01 MED ORDER — METOPROLOL TARTRATE 25 MG PO TABS
25.0000 mg | ORAL_TABLET | Freq: Two times a day (BID) | ORAL | Status: DC
  Administered 2017-01-01 – 2017-01-02 (×3): 25 mg via ORAL
  Filled 2017-01-01 (×3): qty 1

## 2017-01-01 MED ORDER — ONDANSETRON HCL 4 MG/2ML IJ SOLN: 4.0000 mg | Freq: Four times a day (QID) | INTRAMUSCULAR | Status: DC | PRN

## 2017-01-01 MED ORDER — PANTOPRAZOLE SODIUM 40 MG PO TBEC
40.0000 mg | DELAYED_RELEASE_TABLET | Freq: Every day | ORAL | Status: DC
  Administered 2017-01-01 – 2017-01-02 (×2): 40 mg via ORAL
  Filled 2017-01-01 (×2): qty 1

## 2017-01-01 MED ORDER — BUDESONIDE 0.25 MG/2ML IN SUSP
0.5000 mg | Freq: Two times a day (BID) | RESPIRATORY_TRACT | Status: DC
  Administered 2017-01-01: 0.5 mg via RESPIRATORY_TRACT
  Filled 2017-01-01 (×2): qty 4

## 2017-01-01 MED ORDER — SALINE SPRAY 0.65 % NA SOLN
2.0000 | Freq: Two times a day (BID) | NASAL | Status: DC
  Administered 2017-01-01 (×2): 2 via NASAL
  Filled 2017-01-01: qty 44

## 2017-01-01 MED ORDER — ALBUTEROL SULFATE (2.5 MG/3ML) 0.083% IN NEBU: 2.5000 mg | INHALATION_SOLUTION | RESPIRATORY_TRACT | Status: DC | PRN

## 2017-01-01 MED ORDER — AMLODIPINE BESYLATE 10 MG PO TABS
10.0000 mg | ORAL_TABLET | Freq: Every day | ORAL | Status: DC
Start: 2017-01-01 — End: 2017-01-02
  Administered 2017-01-01 – 2017-01-02 (×2): 10 mg via ORAL
  Filled 2017-01-01: qty 2
  Filled 2017-01-01: qty 1

## 2017-01-01 MED ORDER — LOSARTAN POTASSIUM 50 MG PO TABS
100.0000 mg | ORAL_TABLET | Freq: Every day | ORAL | Status: DC
  Administered 2017-01-01 – 2017-01-02 (×2): 100 mg via ORAL
  Filled 2017-01-01 (×2): qty 2

## 2017-01-01 MED ORDER — ALBUTEROL (5 MG/ML) CONTINUOUS INHALATION SOLN
10.0000 mg/h | INHALATION_SOLUTION | RESPIRATORY_TRACT | Status: DC
  Administered 2017-01-01: 10 mg/h via RESPIRATORY_TRACT
  Filled 2017-01-01 (×2): qty 20

## 2017-01-01 MED ORDER — POTASSIUM CHLORIDE CRYS ER 20 MEQ PO TBCR
40.0000 meq | EXTENDED_RELEASE_TABLET | Freq: Once | ORAL | Status: AC
  Administered 2017-01-01: 40 meq via ORAL
  Filled 2017-01-01: qty 2

## 2017-01-01 MED ORDER — BUTALBITAL-APAP-CAFFEINE 50-325-40 MG PO TABS: 1.0000 | ORAL_TABLET | Freq: Four times a day (QID) | ORAL | Status: DC | PRN

## 2017-01-01 MED ORDER — IPRATROPIUM BROMIDE 0.02 % IN SOLN
1.0000 mg | Freq: Once | RESPIRATORY_TRACT | Status: AC
  Administered 2017-01-01: 1 mg via RESPIRATORY_TRACT
  Filled 2017-01-01: qty 5

## 2017-01-01 MED ORDER — SPIRONOLACTONE 50 MG PO TABS
50.0000 mg | ORAL_TABLET | Freq: Every day | ORAL | Status: DC
  Administered 2017-01-01 – 2017-01-02 (×2): 50 mg via ORAL
  Filled 2017-01-01: qty 1
  Filled 2017-01-01 (×2): qty 2
  Filled 2017-01-01: qty 1

## 2017-01-01 MED ORDER — BUDESONIDE 0.25 MG/2ML IN SUSP
0.2500 mg | Freq: Two times a day (BID) | RESPIRATORY_TRACT | Status: DC
  Administered 2017-01-01: 0.25 mg via RESPIRATORY_TRACT
  Filled 2017-01-01: qty 2

## 2017-01-01 MED ORDER — SENNOSIDES-DOCUSATE SODIUM 8.6-50 MG PO TABS
1.0000 | ORAL_TABLET | Freq: Every day | ORAL | Status: DC | PRN
  Administered 2017-01-02: 2 via ORAL
  Filled 2017-01-01: qty 2

## 2017-01-01 MED ORDER — ACETAMINOPHEN 650 MG RE SUPP
650.0000 mg | Freq: Four times a day (QID) | RECTAL | Status: DC | PRN
Start: 2017-01-01 — End: 2017-01-02

## 2017-01-01 MED ORDER — IPRATROPIUM-ALBUTEROL 0.5-2.5 (3) MG/3ML IN SOLN
3.0000 mL | RESPIRATORY_TRACT | Status: DC
  Administered 2017-01-01 – 2017-01-02 (×5): 3 mL via RESPIRATORY_TRACT
  Filled 2017-01-01 (×7): qty 3

## 2017-01-01 MED ORDER — GUAIFENESIN ER 600 MG PO TB12
600.0000 mg | ORAL_TABLET | Freq: Two times a day (BID) | ORAL | Status: DC
  Administered 2017-01-01 – 2017-01-02 (×3): 600 mg via ORAL
  Filled 2017-01-01 (×3): qty 1

## 2017-01-01 MED ORDER — ALBUTEROL SULFATE (2.5 MG/3ML) 0.083% IN NEBU
2.5000 mg | INHALATION_SOLUTION | RESPIRATORY_TRACT | Status: DC
  Administered 2017-01-01: 2.5 mg via RESPIRATORY_TRACT
  Filled 2017-01-01: qty 3

## 2017-01-01 MED ORDER — METHYLPREDNISOLONE SODIUM SUCC 125 MG IJ SOLR
60.0000 mg | Freq: Two times a day (BID) | INTRAMUSCULAR | Status: DC
  Administered 2017-01-01 – 2017-01-02 (×2): 60 mg via INTRAVENOUS
  Filled 2017-01-01 (×2): qty 2

## 2017-01-01 MED ORDER — ALBUTEROL SULFATE (2.5 MG/3ML) 0.083% IN NEBU
5.0000 mg | INHALATION_SOLUTION | Freq: Once | RESPIRATORY_TRACT | Status: AC
  Administered 2017-01-01: 5 mg via RESPIRATORY_TRACT

## 2017-01-01 NOTE — ED Notes (Signed)
Gave pt a happy meal and a ginger al, and ordered pt a lunch tray.

## 2017-01-01 NOTE — ED Notes (Signed)
Gave pt a happy meal and a coke to drink.

## 2017-01-01 NOTE — ED Notes (Signed)
Assisted pt walking to the bathroom on pulse ox, pt stayed at 92-93%, pt was wheezing and stated that she felt SOB while walking.

## 2017-01-01 NOTE — Telephone Encounter (Signed)
lmtcb x1 for pt. 

## 2017-01-01 NOTE — ED Provider Notes (Signed)
[facility] DEPT Provider Note   CSN: [id number] Arrival date & time: 01/01/17  0227     History   Chief Complaint Chief Complaint  Patient presents with  . Shortness of Breath    HPI April [name] is a 46 y.o. female with a hx of COPD and severe, persistent asthma presents to the Emergency Department complaining of gradual, persistent, progressively worsening wheezing and shortness of breath onset 3 days ago. Patient reports she was recently admitted for similar symptoms. Record review shows that patient was admitted on 7/18, 8/14 and 8/25 for persistent asthma. She reports she has been taking her daily steroids as directed. She is using her rescue inhaler and a nebulizer at home without relief. She denies fevers or chills. She does report a small increasing cough. Patient states she was given one albuterol treatment after arrival today which has not made significant improvement. Exertion makes her symptoms worse. Nothing seems to make them significantly better. She denies abdominal pain, nausea, vomiting.  Pt also denies CP, LE swelling, recent travel/surgery, estrogen use, personal/family hx of DVT/PE.    The history is provided by the patient and medical records. No language interpreter was used.    Past Medical History:  Diagnosis Date  . Acanthosis nigricans   . Arthritis   . Asthma    Followed by Dr. Annamaria Boots (pulmonology); receives every other week omalizumab injections; has frequent exacerbations  . COPD (chronic obstructive pulmonary disease) ([place])    PFTs in 2002, FEV1/FVC 65, no post bronchodilater test done  . Depression   . GERD (gastroesophageal reflux disease)   . Headache(784.0)   . Helicobacter pylori (H. pylori) infection   . Hypertension, essential   . Insomnia   . Menorrhagia   . Morbid obesity ([place])   . Obesity   . Seasonal allergies   . Shortness of breath   . Sleep apnea    Sleep study 2008 - mild OSA, not enough events to titrate CPAP  . Tobacco user       Patient Active Problem List   Diagnosis Date Noted  . SOB (shortness of breath) on exertion   . Asthma exacerbation in COPD ([place]) 12/10/2016  . Moderate persistent asthma with (acute) exacerbation 11/13/2016  . Moderate persistent asthma with exacerbation 10/19/2016  . Diarrhea 10/19/2016  . Normocytic anemia 10/02/2016  . Respiratory failure ([place]) 10/02/2016  . Hypertensive urgency, malignant 06/22/2016  . Pulmonary edema 06/22/2016  . Hypertensive urgency 06/22/2016  . Malignant hypertension due to primary aldosteronism ([place]) 05/05/2016  . Lip laceration 05/05/2016  . Elevated hemoglobin A1c 05/05/2016  . Asthma exacerbation 11/10/2015  . GERD (gastroesophageal reflux disease) 08/30/2015  . Generalized anxiety disorder 08/30/2015  . Prediabetes 08/22/2015  . Seasonal allergic rhinitis 08/29/2013  . Leukocytosis 10/21/2012  . Tobacco abuse 10/07/2012  . Asthma, chronic obstructive, without status asthmaticus ([place]) 05/07/2012  . Knee pain, bilateral 04/25/2011  . Primary insomnia 03/14/2011  . Mild obstructive sleep apnea 12/19/2010  . Hypokalemia 08/13/2010  . Cervical back pain with evidence of disc disease 04/08/2008  . Essential hypertension 07/31/2006  . Morbid obesity with body mass index of 50.0-59.9 in adult ([place]) 06/17/2006  . Major depressive disorder, recurrent episode ([place]) 04/10/2006    Past Surgical History:  Procedure Laterality Date  . BREAST REDUCTION SURGERY  09/2011  . TUBAL LIGATION  1996   bilateral    OB History    No data available       Home Medications  Prior to Admission medications   Medication Sig Start Date End Date Taking? Authorizing Provider  acetaminophen-codeine (TYLENOL #3) 300-30 MG tablet Take 1 tablet by mouth every 12 (twelve) hours as needed for moderate pain. 12/13/16   Arnoldo Morale, MD  albuterol (PROAIR HFA) 108 (90 Base) MCG/ACT inhaler Inhale 2 puffs into the lungs every 6 (six) hours as needed for wheezing or  shortness of breath. 12/13/16   Rai, Ripudeep K, MD  albuterol (PROVENTIL) (2.5 MG/3ML) 0.083% nebulizer solution Take 3 mLs (2.5 mg total) by nebulization every 4 (four) hours as needed for wheezing or shortness of breath. 12/13/16   Rai, Ripudeep K, MD  amLODipine (NORVASC) 10 MG tablet Take 1 tablet (10 mg total) by mouth daily. 12/13/16   Rai, Ripudeep Raliegh Ip, MD  arformoterol (BROVANA) 15 MCG/2ML NEBU Take 2 mLs (15 mcg total) by nebulization 2 (two) times daily. 12/13/16   Rai, Vernelle Emerald, MD  benzonatate (TESSALON) 200 MG capsule Take 1 capsule (200 mg total) by mouth 3 (three) times daily as needed for cough. 12/13/16   Rai, Ripudeep K, MD  budesonide (PULMICORT) 0.25 MG/2ML nebulizer solution Take 2 mLs (0.25 mg total) by nebulization 2 (two) times daily. 12/13/16   Mendel Corning, MD  butalbital-acetaminophen-caffeine (FIORICET, ESGIC) [id number] MG tablet Take 1-2 tablets by mouth every 6 (six) hours as needed for headache. 12/19/16 12/19/17  Joy, Shawn C, PA-C  diphenhydramine-acetaminophen (TYLENOL PM) 25-500 MG TABS tablet Take 1 tablet by mouth at bedtime as needed (for sleep).     [provider]  fluticasone (FLONASE) 50 MCG/ACT nasal spray Place 2 sprays into both nostrils daily. 12/13/16   Rai, Vernelle Emerald, MD  furosemide (LASIX) 20 MG tablet Take 1 tablet (20 mg total) by mouth daily. 10/05/16   Reyne Dumas, MD  guaiFENesin (MUCINEX) 600 MG 12 hr tablet Take 1 tablet (600 mg total) by mouth 2 (two) times daily. 12/13/16   Rai, Vernelle Emerald, MD  lactobacillus (FLORANEX/LACTINEX) PACK Take 1 packet (1 g total) by mouth 3 (three) times daily with meals. Patient not taking: Reported on 12/21/2016 12/13/16   Rai, Vernelle Emerald, MD  loratadine (CLARITIN) 10 MG tablet Take 1 tablet (10 mg total) by mouth daily. 08/30/15   [name], MD  losartan (COZAAR) 100 MG tablet Take 1 tablet (100 mg total) by mouth daily. 12/13/16   Rai, Vernelle Emerald, MD  metFORMIN (GLUCOPHAGE) 500 MG tablet Take 1 tablet (500 mg  total) by mouth 2 (two) times daily with a meal. 12/13/16   Arnoldo Morale, MD  methocarbamol (ROBAXIN) 500 MG tablet Take 1 tablet (500 mg total) by mouth 2 (two) times daily. Patient taking differently: Take 500 mg by mouth 2 (two) times daily as needed for muscle spasms.  10/20/16   Rai, Vernelle Emerald, MD  metoCLOPramide (REGLAN) 10 MG tablet Take 1 tablet (10 mg total) by mouth every 6 (six) hours. 12/19/16   Joy, Shawn C, PA-C  metoprolol tartrate (LOPRESSOR) 25 MG tablet Take 1 tablet (25 mg total) by mouth 2 (two) times daily. 12/27/16   Arnoldo Morale, MD  Multiple Vitamin (MULTIVITAMIN WITH MINERALS) TABS tablet Take 1 tablet by mouth daily. One-A-Day    [provider]  nicotine (NICODERM CQ - DOSED IN MG/24 HOURS) 14 mg/24hr patch Place 1 patch (14 mg total) onto the skin daily. Patient taking differently: Place 14 mg onto the skin daily as needed (smoking cessation).  10/04/16   Reyne Dumas, MD  omeprazole (PRILOSEC) 20 MG  capsule Take 1 capsule (20 mg total) by mouth daily. Patient taking differently: Take 20 mg by mouth 2 (two) times daily.  10/09/16   Deneise Lever, MD  predniSONE (DELTASONE) 20 MG tablet Take 3 tabs x 3 days, then 2 tabs x 3 days, then 1 tab x 3days 12/22/16   Cristal Ford, DO  senna-docusate (SENOKOT-S) 8.6-50 MG tablet Start 1 tab QHS, up to 2tabs BID. Max 4 tabs daily for constipation Patient taking differently: Take 1-2 tablets by mouth daily as needed for mild constipation.  10/27/16   Pisciotta, Elmyra Ricks, PA-C  sertraline (ZOLOFT) 100 MG tablet Take 1 tablet (100 mg total) by mouth daily. 12/27/16   Arnoldo Morale, MD  spironolactone (ALDACTONE) 50 MG tablet Take 1 tablet (50 mg total) by mouth daily. 10/04/16   Reyne Dumas, MD  zolpidem (AMBIEN) 5 MG tablet Take 1 tablet (5 mg total) by mouth at bedtime as needed for sleep. 12/22/16   Cristal Ford, DO    Family History Family History  Problem Relation Age of Onset  . Hypertension Mother   . Asthma  Daughter   . Cancer Paternal Aunt   . Asthma Maternal Grandmother     Social History Social History  Substance Use Topics  . Smoking status: Former Smoker    Packs/day: 0.50    Years: 18.00    Types: Cigarettes    Quit date: 04/29/2014  . Smokeless tobacco: Never Used  . Alcohol use No     Allergies   Patient has no known allergies.   Review of Systems Review of Systems  Constitutional: Negative for appetite change, diaphoresis, fatigue, fever and unexpected weight change.  HENT: Negative for mouth sores.   Eyes: Negative for visual disturbance.  Respiratory: Positive for cough, chest tightness, shortness of breath and wheezing.   Cardiovascular: Negative for chest pain.  Gastrointestinal: Negative for abdominal pain, constipation, diarrhea, nausea and vomiting.  Endocrine: Negative for polydipsia, polyphagia and polyuria.  Genitourinary: Negative for dysuria, frequency, hematuria and urgency.  Musculoskeletal: Negative for back pain and neck stiffness.  Skin: Negative for rash.  Allergic/Immunologic: Negative for immunocompromised state.  Neurological: Negative for syncope, light-headedness and headaches.  Hematological: Does not bruise/bleed easily.  Psychiatric/Behavioral: Negative for sleep disturbance. The patient is not nervous/anxious.      Physical Exam Updated Vital Signs BP (!) 129/59 (BP Location: Left Arm)   Pulse 86   Temp 98.1 F (36.7 C) (Oral)   Resp 18   Ht 5\' 5"  (1.651 m)   Wt (!) 146.5 kg (323 lb)   SpO2 100%   BMI 53.75 kg/m   Physical Exam  Constitutional: She appears well-developed and well-nourished. No distress.  Awake, alert, nontoxic appearance  HENT:  Head: Normocephalic and atraumatic.  Mouth/Throat: Oropharynx is clear and moist. No oropharyngeal exudate.  Eyes: Conjunctivae are normal. No scleral icterus.  Neck: Normal range of motion. Neck supple.  Cardiovascular: Normal rate, regular rhythm and intact distal pulses.     Pulmonary/Chest: Accessory muscle usage present. Tachypnea noted. She is in respiratory distress. She has decreased breath sounds. She has wheezes ( Inspiratory and expiratory).  Equal chest expansion  Abdominal: Soft. Bowel sounds are normal. She exhibits no mass. There is no tenderness. There is no rebound and no guarding.  Musculoskeletal: Normal range of motion. She exhibits no edema.  Neurological: She is alert.  Speech is clear and goal oriented Moves extremities without ataxia  Skin: Skin is warm and dry. She is not diaphoretic.  Psychiatric: She has a normal mood and affect.  Nursing note and vitals reviewed.    ED Treatments / Results  Labs (all labs ordered are listed, but only abnormal results are displayed) Labs Reviewed  CBC WITH DIFFERENTIAL/PLATELET - Abnormal; Notable for the following:       Result Value   WBC 16.6 (*)    Hemoglobin 11.8 (*)    RDW 21.1 (*)    All other components within normal limits  BASIC METABOLIC PANEL - Abnormal; Notable for the following:    Potassium 3.2 (*)    Glucose, Bld 111 (*)    Calcium 7.7 (*)    All other components within normal limits    EKG  EKG Interpretation None       Radiology Dg Chest 2 View  Result Date: 01/01/2017 CLINICAL DATA:  Shortness of breath. Asthma for 3 days. History of hypertension. Smoker. EXAM: CHEST  2 VIEW COMPARISON:  12/21/2016 FINDINGS: Normal heart size and pulmonary vascularity. No focal airspace disease or consolidation in the lungs. No blunting of costophrenic angles. No pneumothorax. Mediastinal contours appear intact. Old left rib fracture. IMPRESSION: No active cardiopulmonary disease. Electronically Signed   By: Lucienne Capers M.D.   On: 01/01/2017 03:45    Procedures Procedures (including critical care time)  CRITICAL CARE Performed by: Abigail Butts Total critical care time: 35 minutes Critical care time was exclusive of separately billable procedures and treating other  patients. Critical care was necessary to treat or prevent imminent or life-threatening deterioration. Critical care was time spent personally by me on the following activities: development of treatment plan with patient and/or surrogate as well as nursing, discussions with consultants, evaluation of patient's response to treatment, examination of patient, obtaining history from patient or surrogate, ordering and performing treatments and interventions, ordering and review of laboratory studies, ordering and review of radiographic studies, pulse oximetry and re-evaluation of patient's condition.   Medications Ordered in ED Medications  albuterol (PROVENTIL,VENTOLIN) solution continuous neb (10 mg/hr Nebulization New Bag/Given 01/01/17 0519)  magnesium sulfate IVPB 2 g 50 mL (2 g Intravenous New Bag/Given 01/01/17 0553)  ipratropium (ATROVENT) 0.02 % nebulizer solution (  Canceled Entry 01/01/17 0524)  albuterol (PROVENTIL) (2.5 MG/3ML) 0.083% nebulizer solution 5 mg (5 mg Nebulization Given 01/01/17 0316)  ipratropium (ATROVENT) nebulizer solution 1 mg (1 mg Nebulization Given 01/01/17 0520)  methylPREDNISolone sodium succinate (SOLU-MEDROL) 125 mg/2 mL injection 125 mg (125 mg Intravenous Given 01/01/17 0551)  potassium chloride SA (K-DUR,KLOR-CON) CR tablet 40 mEq (40 mEq Oral Given 01/01/17 8921)     Initial Impression / Assessment and Plan / ED Course  I have reviewed the triage vital signs and the nursing notes.  Pertinent labs & imaging results that were available during my care of the patient were reviewed by me and considered in my medical decision making (see chart for details).     Patient presents with acute asthma exacerbation. Patient has had numerous admissions over the last several weeks for her persistent asthma. She reports that today feels just as bad. His story and expiratory wheezes. Patient is receiving steroids during the exam and hour long albuterol treatment. She has hypokalemia and  was given [place]-Con. Suspect she will need admission again.  At shift change, care transferred to [facility], PA-C who will reassess and determine disposition.  Final Clinical Impressions(s) / ED Diagnoses   Final diagnoses:  Severe persistent asthma with exacerbation    New Prescriptions New Prescriptions   No medications on  file     April [name], Gwenlyn Perking 01/01/17 0631    Carmin Muskrat, MD 01/01/17 [phone]

## 2017-01-01 NOTE — Telephone Encounter (Signed)
[name] please advise where pt can be scheduled. Thanks.

## 2017-01-01 NOTE — H&P (Signed)
History and Physical    MARYALICE PASLEY SEG:[id number] DOB: 1970-09-24 DOA: 01/01/2017  PCP: Arnoldo Morale, MD Patient coming from: home  Chief Complaint: sob  HPI: April [name] is a 46 y.o. female with medical history significant morbid obesity, moderate persistent asthma with frequent exacerbation, diabetes, obstructive sleep apnea, hypertension, depression anxiety presents emergency department with chief complaint persistent worsening shortness of breath. Initial evaluation consistent with asthma exacerbation  Information is obtained from the chart and the patient. She reports she was hospitalized last month for 3 days with an asthma exacerbation in the setting of a right lower lobe pneumonia. At that time she was discharged with Augmentin and prednisone taper. 5 days later she was readmitted for an asthma exacerbation. Reports having nebulizers and inhalers at home which she uses with no relief. She reports seeing her primary care provider 4 days ago and she says "no changes were made in my meds". She indicates she was offered a continuous nebulizer but she refuses that because she was tired and wanted to go home. She came to the emergency department last night with worsening shortness of breath at rest frequent nonproductive cough. She denies fever chills chest pain palpitation headache dizziness syncope or near-syncope. She denies abdominal pain nausea vomiting diarrhea constipation melena bright red blood per rectum. She denies dysuria hematuria frequency or urgency.    ED Course: In the emergency department she's provided with Solu-Medrol 125 mg continuous nebulizer. She is ambulated in the hallway with increased shortness of breath however her oxygen saturation level remained 92-93%. Does have all been wheezing. She is afebrile hemodynamically stable and not hypoxic. Chest x-ray is negative for acute cardiopulmonary process. She has a leukocytosis likely related to steroid use  Review of  Systems: As per HPI otherwise all other systems reviewed and are negative.   Ambulatory Status: Ambulates with a cane with a fairly steady gait. She lives alone is independent with ADLs. No recent falls  Past Medical History:  Diagnosis Date  . Acanthosis nigricans   . Arthritis   . Asthma    Followed by Dr. Annamaria Boots (pulmonology); receives every other week omalizumab injections; has frequent exacerbations  . COPD (chronic obstructive pulmonary disease) ([place])    PFTs in 2002, FEV1/FVC 65, no post bronchodilater test done  . Depression   . GERD (gastroesophageal reflux disease)   . Headache(784.0)   . Helicobacter pylori (H. pylori) infection   . Hypertension, essential   . Insomnia   . Menorrhagia   . Morbid obesity ([place])   . Obesity   . Seasonal allergies   . Shortness of breath   . Sleep apnea    Sleep study 2008 - mild OSA, not enough events to titrate CPAP  . Tobacco user     Past Surgical History:  Procedure Laterality Date  . BREAST REDUCTION SURGERY  09/2011  . TUBAL LIGATION  1996   bilateral    Social History   Social History  . Marital status: Single    Spouse name: N/A  . Number of children: 1  . Years of education: N/A   Occupational History  . CNA    Social History Main Topics  . Smoking status: Former Smoker    Packs/day: 0.50    Years: 18.00    Types: Cigarettes    Quit date: 04/29/2014  . Smokeless tobacco: Never Used  . Alcohol use No  . Drug use: No  . Sexual activity: Not on file   Other Topics  Concern  . 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     No Known Allergies  Family History  Problem Relation Age of Onset  . Hypertension Mother   . Asthma Daughter   . Cancer Paternal Aunt   . Asthma Maternal Grandmother     Prior to Admission medications   Medication Sig Start Date End Date Taking?  Authorizing Provider  acetaminophen-codeine (TYLENOL #3) 300-30 MG tablet Take 1 tablet by mouth every 12 (twelve) hours as needed for moderate pain. 12/13/16  Yes Arnoldo Morale, MD  albuterol (PROAIR HFA) 108 (90 Base) MCG/ACT inhaler Inhale 2 puffs into the lungs every 6 (six) hours as needed for wheezing or shortness of breath. 12/13/16  Yes Rai, Ripudeep K, MD  albuterol (PROVENTIL) (2.5 MG/3ML) 0.083% nebulizer solution Take 3 mLs (2.5 mg total) by nebulization every 4 (four) hours as needed for wheezing or shortness of breath. 12/13/16  Yes Rai, Ripudeep K, MD  amLODipine (NORVASC) 10 MG tablet Take 1 tablet (10 mg total) by mouth daily. 12/13/16  Yes Rai, Ripudeep K, MD  arformoterol (BROVANA) 15 MCG/2ML NEBU Take 2 mLs (15 mcg total) by nebulization 2 (two) times daily. 12/13/16  Yes Rai, Ripudeep K, MD  benzonatate (TESSALON) 200 MG capsule Take 1 capsule (200 mg total) by mouth 3 (three) times daily as needed for cough. 12/13/16  Yes Rai, Ripudeep K, MD  budesonide (PULMICORT) 0.25 MG/2ML nebulizer solution Take 2 mLs (0.25 mg total) by nebulization 2 (two) times daily. 12/13/16  Yes Rai, Vernelle Emerald, MD  butalbital-acetaminophen-caffeine (FIORICET, ESGIC) [phone] MG tablet Take 1-2 tablets by mouth every 6 (six) hours as needed for headache. 12/19/16 12/19/17 Yes Joy, Shawn C, PA-C  fluticasone (FLONASE) 50 MCG/ACT nasal spray Place 2 sprays into both nostrils daily. 12/13/16  Yes Rai, Ripudeep K, MD  furosemide (LASIX) 20 MG tablet Take 1 tablet (20 mg total) by mouth daily. 10/05/16  Yes Reyne Dumas, MD  guaiFENesin (MUCINEX) 600 MG 12 hr tablet Take 1 tablet (600 mg total) by mouth 2 (two) times daily. 12/13/16  Yes Rai, Ripudeep K, MD  loratadine (CLARITIN) 10 MG tablet Take 1 tablet (10 mg total) by mouth daily. 08/30/15  Yes [name], MD  losartan (COZAAR) 100 MG tablet Take 1 tablet (100 mg total) by mouth daily. 12/13/16  Yes Rai, Ripudeep K, MD  metFORMIN (GLUCOPHAGE) 500 MG tablet Take 1  tablet (500 mg total) by mouth 2 (two) times daily with a meal. 12/13/16  Yes Amao, Enobong, MD  methocarbamol (ROBAXIN) 500 MG tablet Take 1 tablet (500 mg total) by mouth 2 (two) times daily. Patient taking differently: Take 500 mg by mouth 2 (two) times daily as needed for muscle spasms.  10/20/16  Yes Rai, Ripudeep K, MD  metoCLOPramide (REGLAN) 10 MG tablet Take 1 tablet (10 mg total) by mouth every 6 (six) hours. Patient taking differently: Take 10 mg by mouth every 6 (six) hours as needed for nausea or vomiting.  12/19/16  Yes Joy, Shawn C, PA-C  metoprolol tartrate (LOPRESSOR) 25 MG tablet Take 1 tablet (25 mg total) by mouth 2 (two) times daily. 12/27/16  Yes Arnoldo Morale, MD  Multiple Vitamin (MULTIVITAMIN WITH MINERALS) TABS tablet Take 1 tablet by mouth daily. One-A-Day   Yes [provider]  nicotine (NICODERM CQ - DOSED IN MG/24 HOURS)  14 mg/24hr patch Place 1 patch (14 mg total) onto the skin daily. Patient taking differently: Place 14 mg onto the skin daily as needed (smoking cessation).  10/04/16  Yes Reyne Dumas, MD  omeprazole (PRILOSEC) 20 MG capsule Take 1 capsule (20 mg total) by mouth daily. Patient taking differently: Take 20 mg by mouth 2 (two) times daily.  10/09/16  Yes Young, Clinton D, MD  senna-docusate (SENOKOT-S) 8.6-50 MG tablet Start 1 tab QHS, up to 2tabs BID. Max 4 tabs daily for constipation Patient taking differently: Take 1-2 tablets by mouth daily as needed for mild constipation.  10/27/16  Yes Pisciotta, Elmyra Ricks, PA-C  sertraline (ZOLOFT) 100 MG tablet Take 1 tablet (100 mg total) by mouth daily. 12/27/16  Yes Arnoldo Morale, MD  spironolactone (ALDACTONE) 50 MG tablet Take 1 tablet (50 mg total) by mouth daily. 10/04/16  Yes Reyne Dumas, MD  zolpidem (AMBIEN) 5 MG tablet Take 1 tablet (5 mg total) by mouth at bedtime as needed for sleep. 12/22/16  Yes Cristal Ford, DO    Physical Exam: Vitals:   01/01/17 0308 01/01/17 0521 01/01/17 0600 01/01/17 0739    BP:   104/73   Pulse:   96   Resp:   20   Temp:      TempSrc:      SpO2:  100% 100% 100%  Weight: (!) 146.5 kg (323 lb)     Height: 5\' 5"  (1.651 m)        General:  Appears calm and comfortable, lying in bed on her side in no acute distress Eyes:  PERRL, EOMI, normal lids, iris ENT:  grossly normal hearing, lips & tongue, mucous membranes of her mouth are pink only slightly dry Neck:  no LAD, masses or thyromegaly Cardiovascular:  RRR, no m/r/g. 1+ LE edema.  Respiratory:  Mild increased work of breathing with conversation. She has fair air movement throughout. She does have diffuse rhonchi with end expiratory wheezing throughout Abdomen:  soft, ntnd, obese positive bowel sounds no guarding or rebounding Skin:  no rash or induration seen on limited exam Musculoskeletal:  grossly normal tone BUE/BLE, good ROM, no bony abnormality Psychiatric:  grossly normal mood and affect, speech fluent and appropriate, AOx3 Neurologic:  CN 2-12 grossly intact, moves all extremities in coordinated fashion, sensation intact  Labs on Admission: I have personally reviewed following labs and imaging studies  CBC:  Recent Labs Lab 12/27/16 1557 01/01/17 0520  WBC 26.4* 16.6*  NEUTROABS 19.6* 11.1*  HGB 12.9 11.8*  HCT 41.6 37.9  MCV 87 88.3  PLT 258 409   Basic Metabolic Panel:  Recent Labs Lab 01/01/17 0520  NA 140  K 3.2*  CL 107  CO2 25  GLUCOSE 111*  BUN 7  CREATININE 0.80  CALCIUM 7.7*   GFR: Estimated Creatinine Clearance: 131.5 mL/min (by C-G formula based on SCr of 0.8 mg/dL). Liver Function Tests: No results for input(s): AST, ALT, ALKPHOS, BILITOT, PROT, ALBUMIN in the last 168 hours. No results for input(s): LIPASE, AMYLASE in the last 168 hours. No results for input(s): AMMONIA in the last 168 hours. Coagulation Profile: No results for input(s): INR, PROTIME in the last 168 hours. Cardiac Enzymes: No results for input(s): CKTOTAL, CKMB, CKMBINDEX, TROPONINI in  the last 168 hours. BNP (last 3 results) No results for input(s): PROBNP in the last 8760 hours. HbA1C: No results for input(s): HGBA1C in the last 72 hours. CBG: No results for input(s): GLUCAP in the last 168 hours. Lipid  Profile: No results for input(s): CHOL, HDL, LDLCALC, TRIG, CHOLHDL, LDLDIRECT in the last 72 hours. Thyroid Function Tests: No results for input(s): TSH, T4TOTAL, FREET4, T3FREE, THYROIDAB in the last 72 hours. Anemia Panel: No results for input(s): VITAMINB12, FOLATE, FERRITIN, TIBC, IRON, RETICCTPCT in the last 72 hours. Urine analysis:    Component Value Date/Time   COLORURINE AMBER (A) 11/13/2016 1830   APPEARANCEUR HAZY (A) 11/13/2016 1830   LABSPEC 1.023 11/13/2016 1830   PHURINE 5.0 11/13/2016 1830   GLUCOSEU NEGATIVE 11/13/2016 1830   GLUCOSEU NEG mg/dL 10/28/2007 2049   HGBUR NEGATIVE 11/13/2016 1830   BILIRUBINUR SMALL (A) 11/13/2016 1830   KETONESUR NEGATIVE 11/13/2016 1830   PROTEINUR 30 (A) 11/13/2016 1830   UROBILINOGEN 1.0 11/21/2014 0707   NITRITE NEGATIVE 11/13/2016 1830   LEUKOCYTESUR NEGATIVE 11/13/2016 1830    Creatinine Clearance: Estimated Creatinine Clearance: 131.5 mL/min (by C-G formula based on SCr of 0.8 mg/dL).  Sepsis Labs: @LABRCNTIP (procalcitonin:4,lacticidven:4) )No results found for this or any previous visit (from the past 240 hour(s)).   Radiological Exams on Admission: Dg Chest 2 View  Result Date: 01/01/2017 CLINICAL DATA:  Shortness of breath. Asthma for 3 days. History of hypertension. Smoker. EXAM: CHEST  2 VIEW COMPARISON:  12/21/2016 FINDINGS: Normal heart size and pulmonary vascularity. No focal airspace disease or consolidation in the lungs. No blunting of costophrenic angles. No pneumothorax. Mediastinal contours appear intact. Old left rib fracture. IMPRESSION: No active cardiopulmonary disease. Electronically Signed   By: Lucienne Capers M.D.   On: 01/01/2017 03:45    EKG: Independently reviewed. Normal  sinus rhythm Nonspecific ST abnormality  Assessment/Plan Principal Problem:   Moderate persistent asthma with (acute) exacerbation Active Problems:   Morbid obesity with body mass index of 50.0-59.9 in adult [facility])   Major depressive disorder, recurrent episode ([place])   Essential hypertension   Hypokalemia   Mild obstructive sleep apnea   Tobacco abuse   Leukocytosis   GERD (gastroesophageal reflux disease)   Generalized anxiety disorder   #1. Moderate persistent asthma with acute exacerbation. Frequent admissions for the same. Reports hot weather is a trigger. Patient not at baseline. She does have audible wheezing but is in no acute distress. I hear fair air movement. She sees Dr. Annamaria Boots but chart review concerning for some possible noncompliance with appointments and medications. She continues to smoke. Review indicates office spirometry April of this year moderately severe obstructive airway disease with a FVC 2.21/68%, FEV1 1.53/57%, ratio 0.69, FEF 25-75% 0.98/34%. Chart review indicates pulmonary office visit April of this year plan to begin maintenance prednisone continue home nebs, Dulera and Xolair -Admit -Scheduled nebulizers -Continue Solu-Medrol -Flutter valve -Antitussives -continue pulmocort -Follow-up outpatient  #2. Hypertension. Fair control in the emergency department. Home medications include Norvasc, Lasix, Cozaar, metoprolol -Continue home meds -Monitor  #3. Leukocytosis. Likely related to steroid use. She is afebrile hemodynamically stable and nontoxic appearing. Chest x-ray as noted above. -Monitor  #4. Obstructive sleep apnea -CPAP  #5. Type 2 diabetes. Serum glucose 111 on admission Recent hemoglobin A1c 5.8. Home medications include metformin. -Hold metformin for now -Sliding scale insulin -Monitor  #6. Obesity. BMI 53.7. -Nutritional consult -patient refused heart healthy carb modified diet  #7. Hypokalemia. Mild. Likely related to low dose  Lasix -Replete    DVT prophylaxis: lovenox Code Status: full  Family Communication: none present  Disposition Plan: home  Consults called: none  Admission status: obs    Dyanne Carrel M MD [facility]  If 7PM-7AM, please contact night-coverage  [email or web address] Password [email or web address]  01/01/2017, 9:15 AM

## 2017-01-01 NOTE — ED Triage Notes (Signed)
Pt c/o shortness of breath x "a few days". Has been using albuterol inhaler without relief, lung sounds diminished, wheezes audible.

## 2017-01-01 NOTE — ED Notes (Signed)
Spoke with PA & charge RN on adult side; advised to move pt. To b15

## 2017-01-01 NOTE — Progress Notes (Signed)
April [name] [id number] Admission Data: 01/01/2017 1:29 PM Attending Provider: Waldemar Dickens, MD  AUQ:[id number], Charlane Ferretti, MD Consults/ Treatment Team:   April [name] is a 46 y.o. female patient admitted from ED awake, alert  & orientated  X 3,  Full Code, VSS - Blood pressure (!) 155/86, pulse 87, temperature 97.6 F (36.4 C), temperature source Oral, resp. rate 19, height 5\' 5"  (1.651 m), weight (!) 146.5 kg (323 lb), SpO2 98 %., shortness of breath on exersion, no c/o chest pain, no distress noted.   IV site WDL:  hand right, condition patent and no redness with a transparent dsg that's clean dry and intact.  Allergies:  No Known Allergies   Past Medical History:  Diagnosis Date  . Acanthosis nigricans   . Arthritis   . Asthma    Followed by Dr. Annamaria Boots (pulmonology); receives every other week omalizumab injections; has frequent exacerbations  . COPD (chronic obstructive pulmonary disease) ([place])    PFTs in 2002, FEV1/FVC 65, no post bronchodilater test done  . Depression   . GERD (gastroesophageal reflux disease)   . Headache(784.0)   . Helicobacter pylori (H. pylori) infection   . Hypertension, essential   . Insomnia   . Menorrhagia   . Morbid obesity ([place])   . Obesity   . Seasonal allergies   . Shortness of breath   . Sleep apnea    Sleep study 2008 - mild OSA, not enough events to titrate CPAP  . Tobacco user     History:  obtained from ED nurse and chart. Tobacco/alcohol: smokes 1/2 ppd/ none  Pt orientation to unit, room and routine. Information packet given to patient/family and safety video watched.  Admission INP armband ID verified with patient/family, and in place. SR up x 2, fall risk assessment complete with Patient and family verbalizing understanding of risks associated with falls. Pt verbalizes an understanding of how to use the call bell and to call for help before getting out of bed.  Skin, clean-dry- intact without evidence of bruising, or skin tears.   No  evidence of skin break down noted on exam. no rashes, no ecchymoses, no petechiae, no nodules, no jaundice, no purpura, no wounds    Will cont to monitor and assist as needed.  Salley Slaughter, RN 01/01/2017 1:29 PM

## 2017-01-01 NOTE — Progress Notes (Deleted)
PULMONARY    No chief complaint on file.    No current facility-administered medications on file prior to visit.    Current Outpatient Prescriptions on File Prior to Visit  Medication Sig  . acetaminophen-codeine (TYLENOL #3) 300-30 MG tablet Take 1 tablet by mouth every 12 (twelve) hours as needed for moderate pain.  [name] albuterol (PROAIR HFA) 108 (90 Base) MCG/ACT inhaler Inhale 2 puffs into the lungs every 6 (six) hours as needed for wheezing or shortness of breath.  [name] albuterol (PROVENTIL) (2.5 MG/3ML) 0.083% nebulizer solution Take 3 mLs (2.5 mg total) by nebulization every 4 (four) hours as needed for wheezing or shortness of breath.  [name] amLODipine (NORVASC) 10 MG tablet Take 1 tablet (10 mg total) by mouth daily.  [name] arformoterol (BROVANA) 15 MCG/2ML NEBU Take 2 mLs (15 mcg total) by nebulization 2 (two) times daily.  . benzonatate (TESSALON) 200 MG capsule Take 1 capsule (200 mg total) by mouth 3 (three) times daily as needed for cough.  . budesonide (PULMICORT) 0.25 MG/2ML nebulizer solution Take 2 mLs (0.25 mg total) by nebulization 2 (two) times daily.  . butalbital-acetaminophen-caffeine (FIORICET, ESGIC) 50-325-40 MG tablet Take 1-2 tablets by mouth every 6 (six) hours as needed for headache.  . diphenhydramine-acetaminophen (TYLENOL PM) 25-500 MG TABS tablet Take 1 tablet by mouth at bedtime as needed (for sleep).   . fluticasone (FLONASE) 50 MCG/ACT nasal spray Place 2 sprays into both nostrils daily.  . furosemide (LASIX) 20 MG tablet Take 1 tablet (20 mg total) by mouth daily.  [name] guaiFENesin (MUCINEX) 600 MG 12 hr tablet Take 1 tablet (600 mg total) by mouth 2 (two) times daily.  [name] loratadine (CLARITIN) 10 MG tablet Take 1 tablet (10 mg total) by mouth daily.  [name] losartan (COZAAR) 100 MG tablet Take 1 tablet (100 mg total) by mouth daily.  . metFORMIN (GLUCOPHAGE) 500 MG tablet Take 1 tablet (500 mg total) by mouth 2 (two) times daily with a meal.  . methocarbamol  (ROBAXIN) 500 MG tablet Take 1 tablet (500 mg total) by mouth 2 (two) times daily. (Patient taking differently: Take 500 mg by mouth 2 (two) times daily as needed for muscle spasms. )  . metoCLOPramide (REGLAN) 10 MG tablet Take 1 tablet (10 mg total) by mouth every 6 (six) hours.  . metoprolol tartrate (LOPRESSOR) 25 MG tablet Take 1 tablet (25 mg total) by mouth 2 (two) times daily.  . Multiple Vitamin (MULTIVITAMIN WITH MINERALS) TABS tablet Take 1 tablet by mouth daily. One-A-Day  . nicotine (NICODERM CQ - DOSED IN MG/24 HOURS) 14 mg/24hr patch Place 1 patch (14 mg total) onto the skin daily. (Patient taking differently: Place 14 mg onto the skin daily as needed (smoking cessation). )  . omeprazole (PRILOSEC) 20 MG capsule Take 1 capsule (20 mg total) by mouth daily. (Patient taking differently: Take 20 mg by mouth 2 (two) times daily. )  . predniSONE (DELTASONE) 20 MG tablet Take 3 tabs x 3 days, then 2 tabs x 3 days, then 1 tab x 3days  . senna-docusate (SENOKOT-S) 8.6-50 MG tablet Start 1 tab QHS, up to 2tabs BID. Max 4 tabs daily for constipation (Patient taking differently: Take 1-2 tablets by mouth daily as needed for mild constipation. )  . sertraline (ZOLOFT) 100 MG tablet Take 1 tablet (100 mg total) by mouth daily.  [name] spironolactone (ALDACTONE) 50 MG tablet Take 1 tablet (50 mg total) by mouth daily.  [name] zolpidem (AMBIEN) 5 MG tablet  Take 1 tablet (5 mg total) by mouth at bedtime as needed for sleep.     Past Medical Hx:  has a past medical history of Acanthosis nigricans; Arthritis; Asthma; COPD (chronic obstructive pulmonary disease) ([place]); Depression; GERD (gastroesophageal reflux disease); Headache(784.0); Helicobacter pylori (H. pylori) infection; Hypertension, essential; Insomnia; Menorrhagia; Morbid obesity ([place]); Obesity; Seasonal allergies; Shortness of breath; Sleep apnea; and Tobacco user.   Past Surgical hx, Allergies, Family hx, Social hx all reviewed.  Vital  Signs There were no vitals taken for this visit.  History of Present Illness April [name] is a 46 y.o. female with a history of *** who presented to the pulmonary office for ***.      Physical Exam  General - well developed adult *** in no acute distress ENT - No sinus tenderness, no oral exudate, no LAN Cardiac - s1s2 regular, no murmur Chest - even/non-labored, lungs bilaterally ***. No wheeze/rales Back - No focal tenderness Abd - Soft, non-tender Ext - No edema Neuro - Normal strength Skin - No rashes Psych - normal mood, and behavior  Studies: ***   Assessment/Plan: Primary Pulmonary - Dr. Annamaria Boots   Discussion:    Plan: ***  There are no Patient Instructions on file for this visit.    [name], NP-C [facility]  [phone] 01/01/2017, 8:23 AM

## 2017-01-01 NOTE — ED Provider Notes (Signed)
Care assumed from previous provider PA [place]. Please see their note for further details to include full history and physical. To summarize in short pt is a 46 year old with history of asthma and asthma exacerbation as required hospitalization at presents to the ED with complaints of shortness breath, wheezing. Case discussed, plan agreed upon. Patient has had several rounds of DuoNeb's, continuous nebulizer, magnesium, Solu-Medrol. She has persistent audible wheezing with shortness of breath and tachypnea on exertion. Saturations remain at 92% while ambulating. Patient feels not at baseline for her breathing. Patient has had several admissions over the past month for same. Mild leukocytosis of 16,000 noted likely due to steroid use. Mild hypokalemia 3.2 but was replaced with oral potassium.  Given patient's persistent tachypnea and wheezing felt that admission was reasonable. Spoke with Santiago Glad with [facility] who agrees to admission to the ED to evaluate patient in place admission orders.  Patient remains hemodynamically stable this time. Updated on plan of care. Vital signs are reassuring.       Doristine Devoid, PA-C 01/01/17 0815    Lajean Saver, MD 01/01/17 1024

## 2017-01-01 NOTE — Telephone Encounter (Signed)
Patient is returning phone call.  °

## 2017-01-01 NOTE — Consult Note (Signed)
PULMONARY / CRITICAL CARE MEDICINE   Name: April [name] MRN: [id number] DOB: [birth year]    ADMISSION DATE:  01/01/2017 CONSULTATION DATE:  01/01/2017  REFERRING MD:  Dr. Marily Memos, [facility]  CHIEF COMPLAINT:  Short of breath  HISTORY OF PRESENT ILLNESS:   46 yo female with allergic asthma presented with cough, wheeze, and chest congestion.  She has been followed by Dr. Annamaria Boots in pulmonary office.  She was previously on xolair, but was not able to make office visits due to transportation issues.  She hasn't gotten xolair shot in about 5 or 6 months.  Her breathing was better when she was getting xolair.  She has some sinus congestion.  She denies skin rash.  She denies tobacco use.  She is applying for disability.  No skin exposures.  She feels that dulera worked well for her, but didn't have this recently.  She was apparently switched to nebulizer therapy, but doesn't have pulmicort at home.   PAST MEDICAL HISTORY :  She  has a past medical history of Acanthosis nigricans; Arthritis; Asthma; COPD (chronic obstructive pulmonary disease) ([place]); Depression; GERD (gastroesophageal reflux disease); Headache(784.0); Helicobacter pylori (H. pylori) infection; Hypertension, essential; Insomnia; Menorrhagia; Morbid obesity ([place]); Obesity; Seasonal allergies; Shortness of breath; Sleep apnea; and Tobacco user.  PAST SURGICAL HISTORY: She  has a past surgical history that includes Tubal ligation (1996) and Breast reduction surgery (09/2011).  No Known Allergies  No current facility-administered medications on file prior to encounter.    Current Outpatient Prescriptions on File Prior to Encounter  Medication Sig  . acetaminophen-codeine (TYLENOL #3) 300-30 MG tablet Take 1 tablet by mouth every 12 (twelve) hours as needed for moderate pain.  [name] albuterol (PROAIR HFA) 108 (90 Base) MCG/ACT inhaler Inhale 2 puffs into the lungs every 6 (six) hours as needed for wheezing or shortness of breath.  [name] albuterol  (PROVENTIL) (2.5 MG/3ML) 0.083% nebulizer solution Take 3 mLs (2.5 mg total) by nebulization every 4 (four) hours as needed for wheezing or shortness of breath.  [name] amLODipine (NORVASC) 10 MG tablet Take 1 tablet (10 mg total) by mouth daily.  [name] arformoterol (BROVANA) 15 MCG/2ML NEBU Take 2 mLs (15 mcg total) by nebulization 2 (two) times daily.  . benzonatate (TESSALON) 200 MG capsule Take 1 capsule (200 mg total) by mouth 3 (three) times daily as needed for cough.  . budesonide (PULMICORT) 0.25 MG/2ML nebulizer solution Take 2 mLs (0.25 mg total) by nebulization 2 (two) times daily.  . butalbital-acetaminophen-caffeine (FIORICET, ESGIC) 50-325-40 MG tablet Take 1-2 tablets by mouth every 6 (six) hours as needed for headache.  . fluticasone (FLONASE) 50 MCG/ACT nasal spray Place 2 sprays into both nostrils daily.  . furosemide (LASIX) 20 MG tablet Take 1 tablet (20 mg total) by mouth daily.  [name] guaiFENesin (MUCINEX) 600 MG 12 hr tablet Take 1 tablet (600 mg total) by mouth 2 (two) times daily.  [name] loratadine (CLARITIN) 10 MG tablet Take 1 tablet (10 mg total) by mouth daily.  [name] losartan (COZAAR) 100 MG tablet Take 1 tablet (100 mg total) by mouth daily.  . metFORMIN (GLUCOPHAGE) 500 MG tablet Take 1 tablet (500 mg total) by mouth 2 (two) times daily with a meal.  . methocarbamol (ROBAXIN) 500 MG tablet Take 1 tablet (500 mg total) by mouth 2 (two) times daily. (Patient taking differently: Take 500 mg by mouth 2 (two) times daily as needed for muscle spasms. )  . metoCLOPramide (REGLAN) 10 MG tablet Take 1 tablet (  10 mg total) by mouth every 6 (six) hours. (Patient taking differently: Take 10 mg by mouth every 6 (six) hours as needed for nausea or vomiting. )  . metoprolol tartrate (LOPRESSOR) 25 MG tablet Take 1 tablet (25 mg total) by mouth 2 (two) times daily.  . Multiple Vitamin (MULTIVITAMIN WITH MINERALS) TABS tablet Take 1 tablet by mouth daily. One-A-Day  . nicotine (NICODERM CQ - DOSED IN MG/24  HOURS) 14 mg/24hr patch Place 1 patch (14 mg total) onto the skin daily. (Patient taking differently: Place 14 mg onto the skin daily as needed (smoking cessation). )  . omeprazole (PRILOSEC) 20 MG capsule Take 1 capsule (20 mg total) by mouth daily. (Patient taking differently: Take 20 mg by mouth 2 (two) times daily. )  . senna-docusate (SENOKOT-S) 8.6-50 MG tablet Start 1 tab QHS, up to 2tabs BID. Max 4 tabs daily for constipation (Patient taking differently: Take 1-2 tablets by mouth daily as needed for mild constipation. )  . sertraline (ZOLOFT) 100 MG tablet Take 1 tablet (100 mg total) by mouth daily.  [name] spironolactone (ALDACTONE) 50 MG tablet Take 1 tablet (50 mg total) by mouth daily.  [name] zolpidem (AMBIEN) 5 MG tablet Take 1 tablet (5 mg total) by mouth at bedtime as needed for sleep.    FAMILY HISTORY:  Her indicated that her mother is alive. She indicated that her father is alive. She indicated that the status of her maternal grandmother is unknown. She indicated that the status of her daughter is unknown. She indicated that the status of her paternal aunt is unknown.    SOCIAL HISTORY: She  reports that she quit smoking about 2 years ago. Her smoking use included Cigarettes. She has a 9.00 pack-year smoking history. She has never used smokeless tobacco. She reports that she does not drink alcohol or use drugs.  REVIEW OF SYSTEMS:   12 point ROS negative except above.  VITAL SIGNS: BP (!) 155/86 (BP Location: Left Arm)   Pulse 87   Temp 97.6 F (36.4 C) (Oral)   Resp 19   Ht 5\' 5"  (1.651 m)   Wt (!) 323 lb (146.5 kg)   SpO2 98%   BMI 53.75 kg/m   INTAKE / OUTPUT: No intake/output data recorded.  PHYSICAL EXAMINATION:  General - pleasant Eyes - pupils reactive ENT - no sinus tenderness, no oral exudate, no LAN Cardiac - regular, no murmur Chest - b/l expiratory wheezing Abd - soft, non tender Ext - no edema Skin - no rashes Neuro - normal strength Psych - normal  mood   LABS:  BMET  Recent Labs Lab 01/01/17 0520  NA 140  K 3.2*  CL 107  CO2 25  BUN 7  CREATININE 0.80  GLUCOSE 111*    Electrolytes  Recent Labs Lab 01/01/17 0520 01/01/17 0918  CALCIUM 7.7*  --   MG  --  1.8    CBC  Recent Labs Lab 12/27/16 1557 01/01/17 0520  WBC 26.4* 16.6*  HGB 12.9 11.8*  HCT 41.6 37.9  PLT 258 265    Imaging Dg Chest 2 View  Result Date: 01/01/2017 CLINICAL DATA:  Shortness of breath. Asthma for 3 days. History of hypertension. Smoker. EXAM: CHEST  2 VIEW COMPARISON:  12/21/2016 FINDINGS: Normal heart size and pulmonary vascularity. No focal airspace disease or consolidation in the lungs. No blunting of costophrenic angles. No pneumothorax. Mediastinal contours appear intact. Old left rib fracture. IMPRESSION: No active cardiopulmonary disease. Electronically Signed  By: Lucienne Capers M.D.   On: 01/01/2017 03:45    DISCUSSION: 47 yo female with allergic asthma presents with recurrent exacerbation.  Likely related to transportation issues, and not being able to keep xolair shot appointments.  ASSESSMENT / PLAN:  Allergic asthma with acute exacerbation. - continue brovana, pulmicort neb for now >> eventually transition to high dose dulera - add singulair - continue solumedrol  - continue flonase, nasal irrigation, claritin - need to get her restarted on xolair as outpt  Hx of OSA. - CPAP qhs  Chesley Mires, MD [place] 01/01/2017, 1:03 PM Pager:  [phone] After 3pm call: [phone]

## 2017-01-02 DIAGNOSIS — J4541 Moderate persistent asthma with (acute) exacerbation: Secondary | ICD-10-CM

## 2017-01-02 DIAGNOSIS — I10 Essential (primary) hypertension: Secondary | ICD-10-CM

## 2017-01-02 DIAGNOSIS — G4733 Obstructive sleep apnea (adult) (pediatric): Secondary | ICD-10-CM

## 2017-01-02 LAB — BASIC METABOLIC PANEL
ANION GAP: 11 (ref 5–15)
BUN: 8 mg/dL (ref 6–20)
CALCIUM: 7.9 mg/dL — AB (ref 8.9–10.3)
CHLORIDE: 104 mmol/L (ref 101–111)
CO2: 23 mmol/L (ref 22–32)
CREATININE: 0.63 mg/dL (ref 0.44–1.00)
GFR calc non Af Amer: >60 mL/min (ref >60)
Glucose, Bld: 155 mg/dL — ABNORMAL HIGH (ref 65–99)
Potassium: 4.1 mmol/L (ref 3.5–5.1)
SODIUM: 138 mmol/L (ref 135–145)

## 2017-01-02 LAB — CBC
HEMATOCRIT: 37.5 % (ref 36.0–46.0)
HEMOGLOBIN: 11.8 g/dL — AB (ref 12.0–15.0)
MCH: 27.5 pg (ref 26.0–34.0)
MCHC: 31.5 g/dL (ref 30.0–36.0)
MCV: 87.4 fL (ref 78.0–100.0)
Platelets: 293 10*3/uL (ref 150–400)
RBC: 4.29 MIL/uL (ref 3.87–5.11)
RDW: 20.9 % — ABNORMAL HIGH (ref 11.5–15.5)
WBC: 18.6 10*3/uL — ABNORMAL HIGH (ref 4.0–10.5)

## 2017-01-02 LAB — PREGNANCY, URINE: Preg Test, Ur: NEGATIVE

## 2017-01-02 IMAGING — CR DG CHEST 2V
2 series · 2 of 2 positions shown · non-contrast
Comparison: Chest radiograph 11/26/2015

CLINICAL DATA: Shortness of breath

EXAM:
CHEST  2 VIEW

[chest pa]
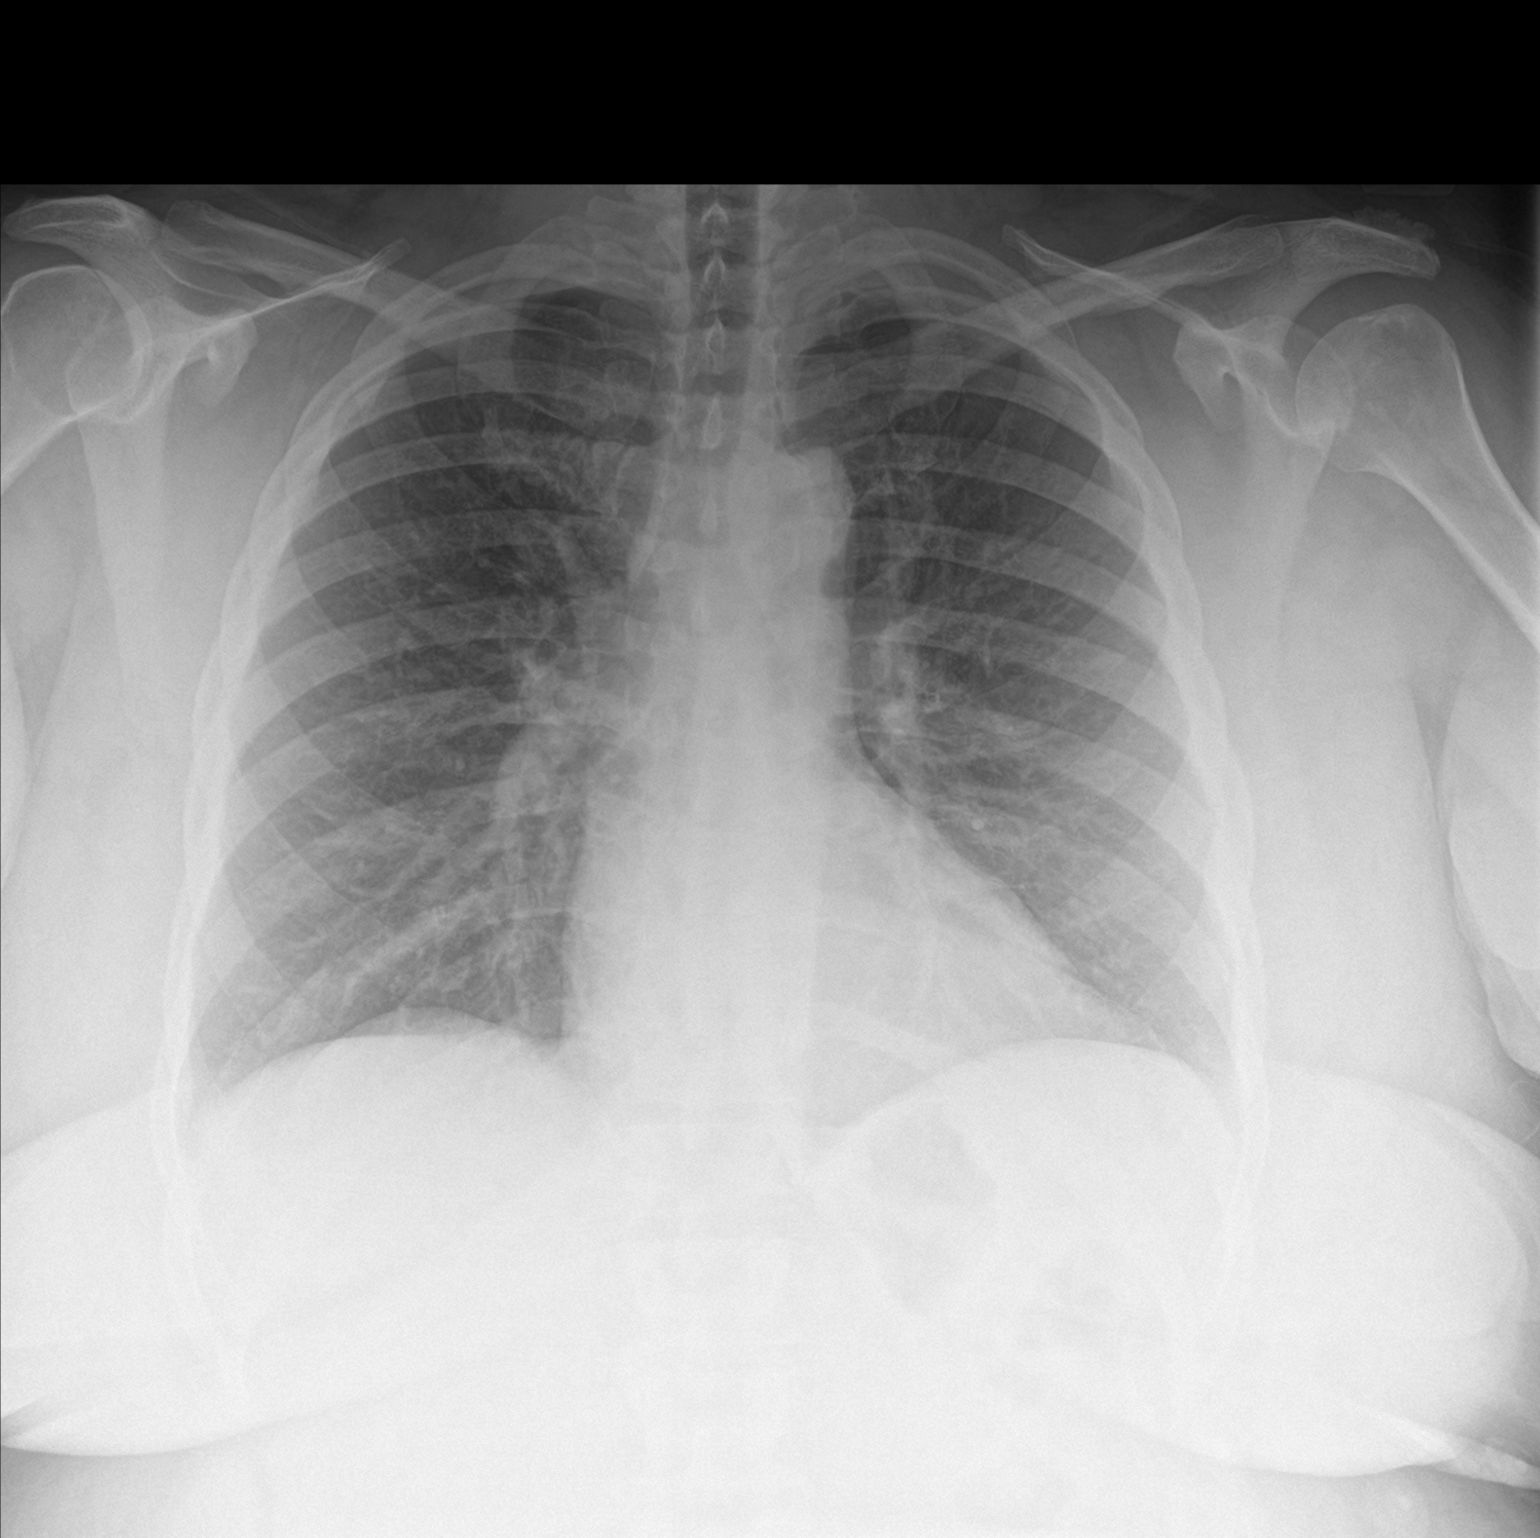

[chest lat]
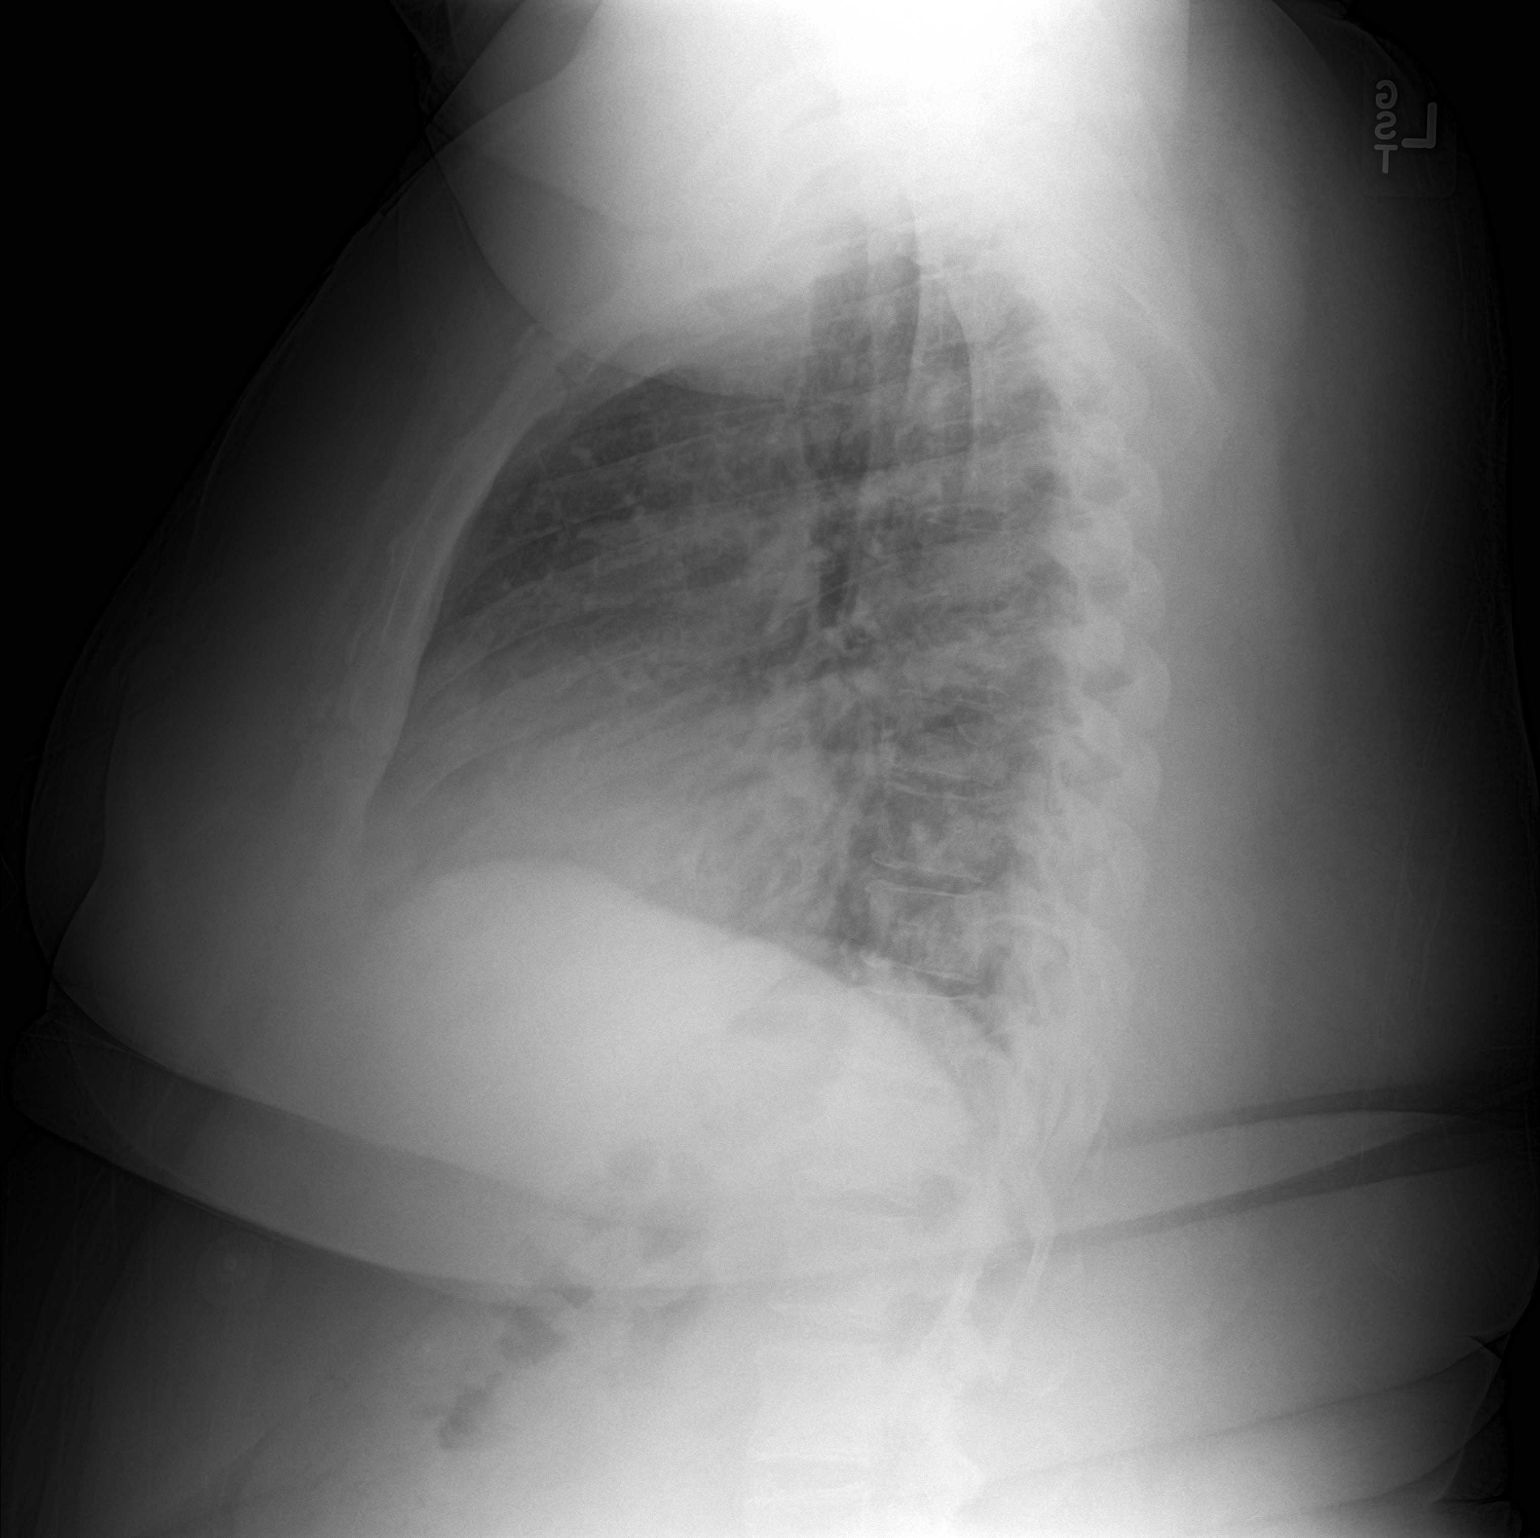

[2 of 2 positions shown; findings below may reference images not displayed]

FINDINGS: Cardiomediastinal contours are normal. No pneumothorax or sizable
pleural effusion.

No focal airspace consolidation or pulmonary edema.
IMPRESSION: Clear lungs.

## 2017-01-02 MED ORDER — ZOLPIDEM TARTRATE 5 MG PO TABS
5.0000 mg | ORAL_TABLET | Freq: Once | ORAL | Status: AC
  Administered 2017-01-02: 5 mg via ORAL

## 2017-01-02 MED ORDER — PREDNISONE 20 MG PO TABS: 40.0000 mg | ORAL_TABLET | Freq: Every day | ORAL | Status: DC

## 2017-01-02 MED ORDER — PREDNISONE 10 MG PO TABS: ORAL_TABLET | ORAL | 0 refills | Status: DC

## 2017-01-02 MED ORDER — IPRATROPIUM-ALBUTEROL 0.5-2.5 (3) MG/3ML IN SOLN
3.0000 mL | Freq: Four times a day (QID) | RESPIRATORY_TRACT | Status: DC
  Administered 2017-01-02: 3 mL via RESPIRATORY_TRACT
  Filled 2017-01-02: qty 3

## 2017-01-02 MED ORDER — MOMETASONE FURO-FORMOTEROL FUM 200-5 MCG/ACT IN AERO: 2.0000 | INHALATION_SPRAY | Freq: Two times a day (BID) | RESPIRATORY_TRACT | 0 refills | Status: DC

## 2017-01-02 MED ORDER — MONTELUKAST SODIUM 10 MG PO TABS: 10.0000 mg | ORAL_TABLET | Freq: Every day | ORAL | 0 refills | Status: DC

## 2017-01-02 MED ORDER — ARFORMOTEROL TARTRATE 15 MCG/2ML IN NEBU: 15.0000 ug | INHALATION_SOLUTION | Freq: Two times a day (BID) | RESPIRATORY_TRACT | 0 refills | Status: DC

## 2017-01-02 MED ORDER — BUDESONIDE 0.5 MG/2ML IN SUSP
0.5000 mg | Freq: Two times a day (BID) | RESPIRATORY_TRACT | Status: DC
  Administered 2017-01-02: 0.5 mg via RESPIRATORY_TRACT
  Filled 2017-01-02: qty 2

## 2017-01-02 MED ORDER — BUDESONIDE 0.25 MG/2ML IN SUSP: 0.2500 mg | Freq: Two times a day (BID) | RESPIRATORY_TRACT | 0 refills | Status: DC

## 2017-01-02 NOTE — Telephone Encounter (Signed)
Pt is aware that Xolair will need to discussed with CY at the time of her OV on 01/06/17. Pt voiced her understanding. Nothing further needed.

## 2017-01-02 NOTE — Progress Notes (Signed)
Pt sleeping when RT came to administer scheduled neb tx. Pt not currently wearing CPAP. No obvious respiratory distress noted at this time. RT will continue to monitor

## 2017-01-02 NOTE — Telephone Encounter (Signed)
Patient is scheduled with CY on 01/06/2017 at 11:30. Patient also request for xolair injection. Patient stated last injection was a month ago.

## 2017-01-02 NOTE — Discharge Summary (Addendum)
Physician Discharge Summary  April [name]  JJO:[id number]  DOB: [birth year]  DOA: 01/01/2017 PCP: Arnoldo Morale, MD  Admit date: 01/01/2017 Discharge date: 01/02/2017  Admitted From: Home  Disposition:  Home   Recommendations for Outpatient Follow-up:  1. Follow up with PCP in 1 week  2. Please obtain BMP/CBC in one week to monitor WBC and renal function 3. Follow-up with pulmonary medicine in 1 week  Discharge Condition: Improved/stable CODE STATUS: full code Diet recommendation: Heart Healthy / Carb Modified   Brief/Interim Summary: April [name] s F 46 year old female with history of asthma, OSA presented to the emergency department complaining of wheeze, cough and chest congestion.patient was admitted for asthma exacerbation, pulmonology was consulted recommended IV steroids, nebulizer and Singulair.Patient has clinically improved and breathing has returned to baseline. Patient oxygenating above 91% on room air. Patient was discharged to follow-up with PCP and pulmonary office.   Subjective: Patient seen and examined on the day of discharge, her breathing has improved significantly. No audible wheezes, comfortable and walking without any shortness of breath.   Discharge Diagnoses/Hospital Course:  Moderate persistent asthma with acute exacerbation - related to noncompliance with medication Patient was treated with IV Solu-Medrol, nebulizer, Singulair. Transitioned to oral prednisone and will continue to taper for 14 days. Continue flutter valve  Pulmonology was consulted and recommended prednisone and nebulizers to follow-up as an outpatient for possible Xolair shots Continue Flonase, and Claritin Advice patient to be adherent to CPAP at night  OSA Continue CPAP daily at bedtime  Hypertension Blood pressure well controlled during hospital stay Continue home medications No changes in medications were made  Leukocytosis No signs of infection Related to steroid use  Type 2  diabetes mellitus Recent A1c 5.8 Resume metformin Follow-up with PCP  Morbid obesity BMI of 53.7 Follow-up with PCP  All other chronic medical condition were stable during the hospitalization.  On the day of the discharge the patient's vitals were stable, and no other acute medical condition were reported by patient. Patient was felt safe to be discharge to home  Discharge Instructions  You were cared for by a hospitalist during your hospital stay. If you have any questions about your discharge medications or the care you received while you were in the hospital after you are discharged, you can call the unit and asked to speak with the hospitalist on call if the hospitalist that took care of you is not available. Once you are discharged, your primary care physician will handle any further medical issues. Please note that NO REFILLS for any discharge medications will be authorized once you are discharged, as it is imperative that you return to your primary care physician (or establish a relationship with a primary care physician if you do not have one) for your aftercare needs so that they can reassess your need for medications and monitor your lab values.  Discharge Instructions    Call MD for:  difficulty breathing, headache or visual disturbances    Complete by:  As directed    Call MD for:  extreme fatigue    Complete by:  As directed    Call MD for:  hives    Complete by:  As directed    Call MD for:  persistant dizziness or light-headedness    Complete by:  As directed    Call MD for:  persistant nausea and vomiting    Complete by:  As directed    Call MD for:  redness, tenderness, or signs of  infection (pain, swelling, redness, odor or green/yellow discharge around incision site)    Complete by:  As directed    Call MD for:  severe uncontrolled pain    Complete by:  As directed    Call MD for:  temperature >100.4    Complete by:  As directed    Diet - low sodium heart healthy     Complete by:  As directed    Increase activity slowly    Complete by:  As directed      Allergies as of 01/02/2017   No Known Allergies     Medication List    STOP taking these medications   methocarbamol 500 MG tablet Commonly known as:  ROBAXIN     TAKE these medications   acetaminophen-codeine 300-30 MG tablet Commonly known as:  TYLENOL #3 Take 1 tablet by mouth every 12 (twelve) hours as needed for moderate pain.   albuterol 108 (90 Base) MCG/ACT inhaler Commonly known as:  PROAIR HFA Inhale 2 puffs into the lungs every 6 (six) hours as needed for wheezing or shortness of breath.   albuterol (2.5 MG/3ML) 0.083% nebulizer solution Commonly known as:  PROVENTIL Take 3 mLs (2.5 mg total) by nebulization every 4 (four) hours as needed for wheezing or shortness of breath.   amLODipine 10 MG tablet Commonly known as:  NORVASC Take 1 tablet (10 mg total) by mouth daily.   arformoterol 15 MCG/2ML Nebu Commonly known as:  BROVANA Take 2 mLs (15 mcg total) by nebulization 2 (two) times daily.   benzonatate 200 MG capsule Commonly known as:  TESSALON Take 1 capsule (200 mg total) by mouth 3 (three) times daily as needed for cough.   budesonide 0.25 MG/2ML nebulizer solution Commonly known as:  PULMICORT Take 2 mLs (0.25 mg total) by nebulization 2 (two) times daily.   butalbital-acetaminophen-caffeine 50-325-40 MG tablet Commonly known as:  FIORICET, ESGIC Take 1-2 tablets by mouth every 6 (six) hours as needed for headache.   fluticasone 50 MCG/ACT nasal spray Commonly known as:  FLONASE Place 2 sprays into both nostrils daily.   furosemide 20 MG tablet Commonly known as:  LASIX Take 1 tablet (20 mg total) by mouth daily.   guaiFENesin 600 MG 12 hr tablet Commonly known as:  MUCINEX Take 1 tablet (600 mg total) by mouth 2 (two) times daily.   loratadine 10 MG tablet Commonly known as:  CLARITIN Take 1 tablet (10 mg total) by mouth daily.   losartan 100 MG  tablet Commonly known as:  COZAAR Take 1 tablet (100 mg total) by mouth daily.   metFORMIN 500 MG tablet Commonly known as:  GLUCOPHAGE Take 1 tablet (500 mg total) by mouth 2 (two) times daily with a meal.   metoCLOPramide 10 MG tablet Commonly known as:  REGLAN Take 1 tablet (10 mg total) by mouth every 6 (six) hours. What changed:  when to take this  reasons to take this   metoprolol tartrate 25 MG tablet Commonly known as:  LOPRESSOR Take 1 tablet (25 mg total) by mouth 2 (two) times daily.   mometasone-formoterol 200-5 MCG/ACT Aero Commonly known as:  DULERA Inhale 2 puffs into the lungs 2 (two) times daily.   montelukast 10 MG tablet Commonly known as:  SINGULAIR Take 1 tablet (10 mg total) by mouth at bedtime.   multivitamin with minerals Tabs tablet Take 1 tablet by mouth daily. One-A-Day   nicotine 14 mg/24hr patch Commonly known as:  NICODERM CQ -  dosed in mg/24 hours Place 1 patch (14 mg total) onto the skin daily. What changed:  when to take this  reasons to take this   omeprazole 20 MG capsule Commonly known as:  PRILOSEC Take 1 capsule (20 mg total) by mouth daily. What changed:  when to take this   predniSONE 10 MG tablet Commonly known as:  DELTASONE Take 4 tablets for 3 days; Take 3 tablets for 4 days; Take 2 tablets for 3 days; Take 1 tablet for 4 days   senna-docusate 8.6-50 MG tablet Commonly known as:  Senokot-S Start 1 tab QHS, up to 2tabs BID. Max 4 tabs daily for constipation What changed:  how much to take  how to take this  when to take this  reasons to take this  additional instructions   sertraline 100 MG tablet Commonly known as:  ZOLOFT Take 1 tablet (100 mg total) by mouth daily.   spironolactone 50 MG tablet Commonly known as:  ALDACTONE Take 1 tablet (50 mg total) by mouth daily.   zolpidem 5 MG tablet Commonly known as:  AMBIEN Take 1 tablet (5 mg total) by mouth at bedtime as needed for sleep.             Discharge Care Instructions        Start     Ordered   01/02/17 0000  arformoterol (BROVANA) 15 MCG/2ML NEBU  2 times daily     01/02/17 1503   01/02/17 0000  budesonide (PULMICORT) 0.25 MG/2ML nebulizer solution  2 times daily     01/02/17 1503   01/02/17 0000  montelukast (SINGULAIR) 10 MG tablet  Daily at bedtime     01/02/17 1503   01/02/17 0000  predniSONE (DELTASONE) 10 MG tablet     01/02/17 1503   01/02/17 0000  mometasone-formoterol (DULERA) 200-5 MCG/ACT AERO  2 times daily     01/02/17 1503   01/02/17 0000  Increase activity slowly     01/02/17 1503   01/02/17 0000  Diet - low sodium heart healthy     01/02/17 1503   01/02/17 0000  Call MD for:  temperature >100.4     01/02/17 1503   01/02/17 0000  Call MD for:  persistant nausea and vomiting     01/02/17 1503   01/02/17 0000  Call MD for:  severe uncontrolled pain     01/02/17 1503   01/02/17 0000  Call MD for:  redness, tenderness, or signs of infection (pain, swelling, redness, odor or green/yellow discharge around incision site)     01/02/17 1503   01/02/17 0000  Call MD for:  difficulty breathing, headache or visual disturbances     01/02/17 1503   01/02/17 0000  Call MD for:  hives     01/02/17 1503   01/02/17 0000  Call MD for:  persistant dizziness or light-headedness     01/02/17 1503   01/02/17 0000  Call MD for:  extreme fatigue     01/02/17 1503     Follow-up Information    Parrett, Fonnie Mu, NP Follow up on 01/08/2017.   Specialty:  Pulmonary Disease Why:  9:15am  Contact information: [address] [address] [phone]        Arnoldo Morale, MD. Schedule an appointment as soon as possible for a visit in 1 week(s).   Specialty:  Family Medicine Why:  Hospital follow up  Contact information: [address] [place] [address] [phone]  No Known Allergies  Consultations:  Pulmonary   Procedures/Studies: Dg Chest 2 View  Result Date:  01/01/2017 CLINICAL DATA:  Shortness of breath. Asthma for 3 days. History of hypertension. Smoker. EXAM: CHEST  2 VIEW COMPARISON:  12/21/2016 FINDINGS: Normal heart size and pulmonary vascularity. No focal airspace disease or consolidation in the lungs. No blunting of costophrenic angles. No pneumothorax. Mediastinal contours appear intact. Old left rib fracture. IMPRESSION: No active cardiopulmonary disease. Electronically Signed   By: Lucienne Capers M.D.   On: 01/01/2017 03:45   Dg Chest 2 View  Result Date: 12/21/2016 CLINICAL DATA:  Asthma attack.  Wheezing. EXAM: CHEST  2 VIEW COMPARISON:  December 18, 2016 FINDINGS: The heart size and mediastinal contours are within normal limits. Both lungs are clear. The visualized skeletal structures are unremarkable. IMPRESSION: No active cardiopulmonary disease. Electronically Signed   By: Dorise Bullion III M.D   On: 12/21/2016 10:49   Dg Chest 2 View  Result Date: 12/18/2016 CLINICAL DATA:  Wheezing EXAM: CHEST  2 VIEW COMPARISON:  12/10/2016 FINDINGS: The heart size and mediastinal contours are within normal limits. Mild increase in interstitial prominence and slight peribronchial thickening seen to better advantage on the lateral view. Findings likely reflect chronic bronchitic change. No alveolar consolidations are noted. Subtle ground-glass appearance of the lungs may reflect an alveolitis or pneumonitis. The visualized skeletal structures are unremarkable. IMPRESSION: Stable mild diffuse bronchitic change with faint ground-glass appearance of the lungs that may reflect an alveolitis/pneumonitis or possibly some hypoventilatory change of the lungs. Electronically Signed   By: Ashley Royalty M.D.   On: 12/18/2016 23:19   Dg Chest 2 View  Result Date: 12/10/2016 CLINICAL DATA:  One week history of asthmatic symptoms including shortness of breath. History of asthma -COPD. Former smoker. EXAM: CHEST  2 VIEW COMPARISON:  PA and lateral chest x-ray of November 13, 2016 FINDINGS: The lungs are adequately inflated. There are slightly increased lung markings overlying the lower thoracic spine not clearly evident on the frontal view. There is no pleural effusion. The heart and pulmonary vascularity are normal. The trachea is midline. IMPRESSION: Chronic bronchitic-reactive airway changes. Atelectasis or early pneumonia posteriorly likely in the right lower lobe. Followup PA and lateral chest X-ray is recommended in 3-4 weeks following trial of antibiotic therapy to ensure resolution and exclude underlying malignancy. Electronically Signed   By: David  Martinique M.D.   On: 12/10/2016 14:37   Ct Head Wo Contrast  Result Date: 12/18/2016 CLINICAL DATA:  Acute onset was superior right-sided headache. Worst headache of life. EXAM: CT HEAD WITHOUT CONTRAST TECHNIQUE: Contiguous axial images were obtained from the base of the skull through the vertex without intravenous contrast. COMPARISON:  Head CT 10/20/2016 FINDINGS: Brain: No intracranial hemorrhage, mass effect, or midline shift. No hydrocephalus. The basilar cisterns are patent. No evidence of territorial infarct or acute ischemia. No extra-axial or intracranial fluid collection. Vascular: No hyperdense vessel or unexpected calcification. Skull: Normal. Negative for fracture or focal lesion. Sinuses/Orbits: Paranasal sinuses and mastoid air cells are clear. The visualized orbits are unremarkable. Other: None. IMPRESSION: Unremarkable noncontrast head CT. Electronically Signed   By: Jeb Levering M.D.   On: 12/18/2016 22:50    Discharge Exam: Vitals:   01/02/17 0545 01/02/17 1419  BP: 131/63 128/69  Pulse: 87 79  Resp: 20 16  Temp: 97.9 F (36.6 C) 98.5 F (36.9 C)  SpO2: 100% 98%   Vitals:   01/01/17 2300 01/02/17 0220 01/02/17 0545 01/02/17 1419  BP:   131/63 128/69  Pulse:   87 79  Resp:   20 16  Temp:   97.9 F (36.6 C) 98.5 F (36.9 C)  TempSrc:    Oral  SpO2: 100% 98% 100% 98%  Weight:       Height:        General: Pt is alert, awake, not in acute distress Cardiovascular: RRR, S1/S2 +, no rubs, no gallops Respiratory: non-labored, decrease air entry at the bases likely from body habitus. Faint forced expiratory wheezing bilaterally. Abdominal: obese,Soft, NT, ND, bowel sounds + Extremities: no edema, no cyanosis   The results of significant diagnostics from this hospitalization (including imaging, microbiology, ancillary and laboratory) are listed below for reference.     Microbiology: No results found for this or any previous visit (from the past 240 hour(s)).   Labs: BNP (last 3 results)  Recent Labs  02/28/16 0949 06/21/16 1136 11/13/16 1926  BNP 21.4 37.3 16.1   Basic Metabolic Panel:  Recent Labs Lab 01/01/17 0520 01/01/17 0918 01/02/17 0549  NA 140  --  138  K 3.2*  --  4.1  CL 107  --  104  CO2 25  --  23  GLUCOSE 111*  --  155*  BUN 7  --  8  CREATININE 0.80  --  0.63  CALCIUM 7.7*  --  7.9*  MG  --  1.8  --    Liver Function Tests: No results for input(s): AST, ALT, ALKPHOS, BILITOT, PROT, ALBUMIN in the last 168 hours. No results for input(s): LIPASE, AMYLASE in the last 168 hours. No results for input(s): AMMONIA in the last 168 hours. CBC:  Recent Labs Lab 12/27/16 1557 01/01/17 0520 01/02/17 0549  WBC 26.4* 16.6* 18.6*  NEUTROABS 19.6* 11.1*  --   HGB 12.9 11.8* 11.8*  HCT 41.6 37.9 37.5  MCV 87 88.3 87.4  PLT 258 265 293   Cardiac Enzymes: No results for input(s): CKTOTAL, CKMB, CKMBINDEX, TROPONINI in the last 168 hours. BNP: Invalid input(s): POCBNP CBG: No results for input(s): GLUCAP in the last 168 hours. D-Dimer No results for input(s): DDIMER in the last 72 hours. Hgb A1c No results for input(s): HGBA1C in the last 72 hours. Lipid Profile No results for input(s): CHOL, HDL, LDLCALC, TRIG, CHOLHDL, LDLDIRECT in the last 72 hours. Thyroid function studies No results for input(s): TSH, T4TOTAL, T3FREE,  THYROIDAB in the last 72 hours.  Invalid input(s): FREET3 Anemia work up No results for input(s): VITAMINB12, FOLATE, FERRITIN, TIBC, IRON, RETICCTPCT in the last 72 hours. Urinalysis    Component Value Date/Time   COLORURINE YELLOW 01/01/2017 0935   APPEARANCEUR HAZY (A) 01/01/2017 0935   LABSPEC 1.012 01/01/2017 0935   PHURINE 5.0 01/01/2017 0935   GLUCOSEU 50 (A) 01/01/2017 0935   GLUCOSEU NEG mg/dL 10/28/2007 2049   HGBUR NEGATIVE 01/01/2017 0935   BILIRUBINUR NEGATIVE 01/01/2017 0935   KETONESUR NEGATIVE 01/01/2017 0935   PROTEINUR NEGATIVE 01/01/2017 0935   UROBILINOGEN 1.0 11/21/2014 0707   NITRITE NEGATIVE 01/01/2017 0935   LEUKOCYTESUR NEGATIVE 01/01/2017 0935   Sepsis Labs Invalid input(s): PROCALCITONIN,  WBC,  LACTICIDVEN Microbiology No results found for this or any previous visit (from the past 240 hour(s)).   Time coordinating discharge: 32 minutes  SIGNED:  Chipper Oman, MD  [facility] 01/02/2017, 3:03 PM  Pager please text page via  [email or web address] Password [email or web address]

## 2017-01-02 NOTE — Progress Notes (Signed)
PULMONARY / CRITICAL CARE MEDICINE   Name: April [name] MRN: [id number] DOB: 1971-04-20    ADMISSION DATE:  01/01/2017 CONSULTATION DATE:  01/01/2017  REFERRING MD:  Dr. Marily Memos, [facility]  CHIEF COMPLAINT:  Short of breath  HISTORY OF PRESENT ILLNESS:   46 yo female with allergic asthma presented with cough, wheeze, and chest congestion.  She has been followed by Dr. Annamaria Boots in pulmonary office.  She was previously on xolair, but was not able to make office visits due to transportation issues.  She hasn't gotten xolair shot in about 5 or 6 months.  Her breathing was better when she was getting xolair.  She has some sinus congestion.  She denies skin rash.  She denies tobacco use.  She is applying for disability.  No skin exposures.  She feels that dulera worked well for her, but didn't have this recently.  She was apparently switched to nebulizer therapy, but doesn't have pulmicort at home.   Subjective:    VITAL SIGNS: BP 131/63 (BP Location: Right Wrist)   Pulse 87   Temp 97.9 F (36.6 C)   Resp 20   Ht 5\' 5"  (1.651 m)   Wt (!) 146.6 kg (323 lb 3.2 oz)   SpO2 100%   BMI 53.78 kg/m   INTAKE / OUTPUT: I/O last 3 completed shifts: In: 360 [P.O.:360] Out: 200 [Urine:200]  PHYSICAL EXAMINATION:  General:  Pleasant female, obese, NAD  HEENT: MM pink/moist Neuro: awake, alert, appropriate, MAE  CV: s1s2 rrr, no m/r/g PULM: even/non-labored, lungs bilaterally faint exp wheeze  KV:QQVZ, non-tender, bsx4 active  Extremities: warm/dry, no edema  Skin: no rashes or lesions    LABS:  BMET  Recent Labs Lab 01/01/17 0520 01/02/17 0549  NA 140 138  K 3.2* 4.1  CL 107 104  CO2 25 23  BUN 7 8  CREATININE 0.80 0.63  GLUCOSE 111* 155*    Electrolytes  Recent Labs Lab 01/01/17 0520 01/01/17 0918 01/02/17 0549  CALCIUM 7.7*  --  7.9*  MG  --  1.8  --     CBC  Recent Labs Lab 12/27/16 1557 01/01/17 0520 01/02/17 0549  WBC 26.4* 16.6* 18.6*  HGB 12.9 11.8* 11.8*   HCT 41.6 37.9 37.5  PLT 258 265 293    Imaging No results found.  DISCUSSION: 46 yo female with allergic asthma presents with recurrent exacerbation.  Likely related to transportation issues, and not being able to keep xolair shot appointments.  ASSESSMENT / PLAN:  Allergic asthma with acute exacerbation - likely r/t compliance/unable to get to office for xolair.  - continue brovana, pulmicort neb for now >> eventually transition to high dose dulera - continue singulair added 9/5 - continue solumedrol for now - likely switch to PO pred in am  - continue flonase, nasal irrigation, claritin - need to get her restarted on xolair as outpt -- outpt appointment made.   Hx of OSA. - CPAP qhs  PCCM signing off, will see as outpt.  Please call back if needed.   Nickolas Madrid, NP 01/02/2017  11:53 AM Pager: [phone] or [phone]  STAFF NOTE: I, Merrie Roof, MD FACP have personally reviewed patient's available data, including medical history, events of note, physical examination and test results as part of my evaluation. I have discussed with resident/NP and other care providers such as pharmacist, RN and RRT. In addition, I personally evaluated patient and elicited key findings of: awake, ambulating in the room, NO wheezing, she  is moving air well today, still with some edema lower ext, maintain lasix for edema, pcxr I reviewed did not show infiltrate or edema, can reduce steroids to pred and taper of 1 week, to have follow up in office, see regemin Bders recs above whci I agree with  Lavon Paganini. Titus Mould, MD, [place] Pgr: [facility] 01/02/2017 3:18 PM

## 2017-01-02 NOTE — Progress Notes (Signed)
MD notified for patient request for extra 5 mg Lorrin Mais

## 2017-01-02 NOTE — Telephone Encounter (Signed)
Any RNA slot can be used. Thanks.

## 2017-01-02 NOTE — BH Specialist Note (Signed)
Integrated Behavioral Health Initial Visit  MRN: [id number] Name: April [name]   Session Start time: 3:05 PM Session End time: 3:45 PM Total time: 40 minutes  Type of Service: [place] Interpretor:No. Interpretor Name and Language: N/A   Warm Hand Off Completed.       SUBJECTIVE: April [name] is a 46 y.o. female accompanied by patient. Patient was referred by Dr. Jarold [name] for anxiety and depression. Patient reports the following symptoms/concerns: overwhelming feelings of sadness and worry, difficulty sleeping, racing thoughts, low motivation, withdrawn behavior, difficulty concentrating, and hx of suicidal ideations Duration of problem: Ongoing; Severity of problem: severe  OBJECTIVE: Mood: Dysphoric and Affect: Depressed Risk of harm to self or others: No plan to harm self or others   LIFE CONTEXT: Family and Social: Pt receives support from adult daughters, boyfriend, and mother. She is close to her grandchildren School/Work: Pt is unemployed. She receives food stamps [phone]) and has applied for disability (pending hearing date) Self-Care: Pt participates in medication management. She stated that she attends classes on weight loss; however, continues to gain weight due to ongoing stress Life Changes: Pt has ongoing medical concerns, financial strain, and conflict with family  GOALS ADDRESSED: Patient will reduce symptoms of: anxiety, depression and stress and increase knowledge and/or ability of: coping skills and healthy habits and also: Increase adequate support systems for patient/family and Improve medication compliance   INTERVENTIONS: Mindfulness or Relaxation Training, Supportive Counseling, Psychoeducation and/or Health Education and Link to [employer]  Standardized Assessments completed: Patient declined screening  ASSESSMENT: Patient currently experiencing depression and anxiety triggered by ongoing medical concerns,  financial strain, and conflict with family. She reports overwhelming feelings of sadness and worry, difficulty sleeping, racing thoughts, low motivation, withdrawn behavior, difficulty concentrating, and hx of suicidal ideations. Pt denied SI/HI/AVH. She receives limited support. Patient may benefit from psychoeducation and psychotherapy. LCSWA educated pt on the correlation between one's physical and mental health, in addition, to how stress can negatively impact health. LCSWA discussed benefits of applying healthy coping skills to decrease symptoms and taught relaxation skills. Pt was provided community resources for employment, psychotherapy, medication management, and crisis intervention.   PLAN: 1. Follow up with behavioral health clinician on : Pt was encouraged to contact LCSWA if symptoms worsen or fail to improve to schedule behavioral appointments at [facility]. 2. Behavioral recommendations: LCSWA recommends that pt apply healthy coping skills discussed, comply with medication management, and utilize provided resources. Pt is encouraged to schedule follow up appointment with LCSWA 3. Referral(s): [place] (In Clinic), [facility] (LME/Outside Clinic) and [employer] Resources:  Finances and Housing 4. "From scale of 1-10, how likely are you to follow plan?": 7/10  April Chesterfield, LCSW 01/02/17 5:30 PM

## 2017-01-02 NOTE — Plan of Care (Signed)
Problem: Safety: Goal: Ability to remain free from injury will improve Outcome: Progressing Pt will be free from falls and injuries during this hospitalization.  Problem: Respiratory: Goal: Ability to maintain a clear airway will improve Outcome: Progressing Pt will be free from SOB prior to discharge.

## 2017-01-02 NOTE — Progress Notes (Signed)
Pt placed on nasal cpap for the night.  Tolerating well.

## 2017-01-02 NOTE — Telephone Encounter (Signed)
Pt.'s last xolair inj. Was 06/07/16. She will need to discuss xolair with him before I can give her a shot. Pt. Has 2 doses on hand if CY wants her to get a shot. I'll need to know if he wants to wait for awhile after inj., if he says it's ok to give her an inj.. Please advise Joellen Jersey he needs to discuss compliance with her.

## 2017-01-02 NOTE — Telephone Encounter (Signed)
Tammy please advise in regards to Xolair. Thanks.

## 2017-01-02 NOTE — Telephone Encounter (Signed)
lmtcb x1 for pt. 

## 2017-01-03 ENCOUNTER — Telehealth: Payer: Self-pay

## 2017-01-03 ENCOUNTER — Institutional Professional Consult (permissible substitution): Payer: Self-pay | Admitting: Licensed Clinical Social Worker

## 2017-01-03 NOTE — Telephone Encounter (Signed)
[facility] Clinic Post-discharge Follow-Up Phone Call:  Date of Discharge:  01/02/2017 Principal Discharge Diagnosis(es):  Asthma exacerbation Post-discharge Communication: (Clearly document all attempts clearly and date contact made) attempted to contact the patient. Her daughter, Doristine Counter, answered and stated that her mother was not around.  She said that she would have her return the call to this CM Call Completed: No

## 2017-01-06 ENCOUNTER — Encounter: Payer: Self-pay | Admitting: Internal Medicine

## 2017-01-06 ENCOUNTER — Telehealth: Payer: Self-pay

## 2017-01-06 ENCOUNTER — Ambulatory Visit (INDEPENDENT_AMBULATORY_CARE_PROVIDER_SITE_OTHER): Payer: Medicaid Other | Admitting: Internal Medicine

## 2017-01-06 ENCOUNTER — Other Ambulatory Visit: Payer: Medicaid Other

## 2017-01-06 VITALS — BP 130/76 | HR 88 | Ht 66.0 in | Wt 343.8 lb

## 2017-01-06 DIAGNOSIS — J4550 Severe persistent asthma, uncomplicated: Secondary | ICD-10-CM | POA: Diagnosis not present

## 2017-01-06 DIAGNOSIS — Z23 Encounter for immunization: Secondary | ICD-10-CM

## 2017-01-06 DIAGNOSIS — G4733 Obstructive sleep apnea (adult) (pediatric): Secondary | ICD-10-CM

## 2017-01-06 DIAGNOSIS — J449 Chronic obstructive pulmonary disease, unspecified: Secondary | ICD-10-CM

## 2017-01-06 DIAGNOSIS — J4489 Other specified chronic obstructive pulmonary disease: Secondary | ICD-10-CM

## 2017-01-06 MED ORDER — MOMETASONE FURO-FORMOTEROL FUM 200-5 MCG/ACT IN AERO: 2.0000 | INHALATION_SPRAY | Freq: Two times a day (BID) | RESPIRATORY_TRACT | 12 refills | Status: DC

## 2017-01-06 MED ORDER — MOMETASONE FURO-FORMOTEROL FUM 200-5 MCG/ACT IN AERO: 2.0000 | INHALATION_SPRAY | Freq: Two times a day (BID) | RESPIRATORY_TRACT | 0 refills | Status: DC

## 2017-01-06 MED FILL — DULERA 200 MCG/5 MCG INH: 200-5 | 30 days supply | Qty: 13 | Fill #0

## 2017-01-06 NOTE — Progress Notes (Signed)
Patient ID: April [name], female    DOB: 1970-12-11, 46 y.o.   MRN: [id number]  04/08/13- 69 yoF former smoker followed for chronic severe asthma/ Xolair complicated by OSA/ CPAP,  morbid obesity, HBP, depression, GERD FOLLOWS FOR: still on Xolair; continues to wheeze alot. Feels like since taking clonazepam she wakes up with the feeling of not being able to breath. She describes waking wheezing and short of breath after taking clonazepam. It sounds as if she just sleeps deeply. Ambien caused hallucination when she was taking 2 or 3 at a time. She says she was "given" her CPAP machine at the hospital but with no followup instructions and no explanation as to how to use it.  NPSG 03/31/07- AHI 5.8/ hr, weight 330 lbs. She canceled planned more recent study. Office spirometry 10/24/14- severe obstructive airways disease. FVC 2.10/66%, FEV1 1.25/47%, FEV1/FVC 0.59, FEF 25-75 percent 0.64/19% Unattended HST 07/06/16- AHI 11/hour, desaturation to 74%. Office Spirometry 07/29/2016-moderately severe obstructive airways disease. FVC 2.21/68%, FEV1 1.53/57%, ratio 0.69, FEF 25-75% 0.98/34% -------------------------------------------------------  07/29/2016-46 year old female former smoker followed for chronic severe asthma/Xolair, complicated by noncompliance, OSA/CPAP, morbid obesity, HBP, depression, GERD Unattended HST 07/06/16- AHI 11/hour, desaturation to 74% Here with female companion today who tells her she is not snoring much. She is willing to try CPAP again but says she only tried it briefly before when pressure was too high. There was never enough compliance to allow for adjustment.  She complains of difficulty initiating and maintaining sleep. We had some samples to offer and discussed trial of REM-Fresh melatonin. Last flare of asthma was in February and she feels well today. It helped her to have prednisone one or 25 mg tablets to use at home for interrupting exacerbations before needing emergency room.  Not using Spiriva. Nebulizer with ipratropium/albuterol used once or twice daily when needed. Dulera 200. Last Xolair injection 1 month ago. She is vague about compliance and tends to use medications only when she feels asthma flaring. Education done again. CXR 06/21/2016 IMPRESSION: Cardiomegaly with very mild interstitial prominence suggesting mild interstitial edema. Small bilateral pleural effusions cannot be excluded . Office Spirometry 07/29/2016-moderately severe obstructive airways disease. FVC 2.21/68%, FEV1 1.53/57%, ratio 0.69, FEF 25-75% 0.98/34%  01/06/17- 46 year old female former smoker followed for chronic severe asthma/Xolair, complicated by noncompliance, OSA/CPAP, morbid obesity, HBP, depression, GERD CPAP AutoPap 5-20/Advanced FOLLOWS FOR: Recent ED visits for SOB; patient does not care to take Prednisone. Also, patient has missed Xolair injections -would like to get restarted if possible.  Eosinophils 1% on steroids 01/01/17 Says she is "up and down". Has not been consistent with Xolair. Tries to get by with less prednisone but took 40 mg yesterday because of increasing wheeze. No longer has a maintenance controller, only rescue inhaler. 5 ER visits last 6 months. Needs nebulizer machine. 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 CXR 01/01/17 IMPRESSION: No active cardiopulmonary disease.  Review of Systems-See HPI Constitutional:   No-   weight loss, night sweats, fevers, chills, +fatigue, lassitude. HEENT:   No-  headaches, difficulty swallowing, tooth/dental problems, sore throat,    sneezing, itching, ear ache, +nasal congestion, post nasal drip,  CV:  No-   chest pain, orthopnea, PND, swelling in lower extremities, anasarca, dizziness, palpitations Resp: +shortness of breath with exertion or at rest.              productive cough,  + non-productive cough,  No- coughing up of  blood.              No-   change in color  of mucus. +wheezing.   Skin: No-   rash or lesions. GI:  No-   heartburn, indigestion, abdominal pain, nausea, vomiting,  GU: MS:  No-   joint pain or swelling.   Neuro-     nothing unusual Psych:  No- change in mood or affect. No depression or anxiety.  No memory loss.    Objective:   Physical Exam General- Alert, Oriented, Affect-appropriate, Distress- none acute, + morbid  obesity,  Skin- rash-none, lesions- none, excoriation- none Lymphadenopathy- none Head- atraumatic            Eyes- Gross vision intact, PERRLA, conjunctivae clear secretions            Ears- Hearing, canals-normal            Nose- Clear, no-Septal dev, mucus, polyps, erosion, perforation             Throat- Mallampati II , mucosa clear , drainage- none, tonsils- atrophic,   + Missing teeth, upper denture plate                               Neck- flexible , trachea midline, no stridor , thyroid nl, carotid no bruit Chest - symmetrical excursion , unlabored           Heart/CV- RRR , no murmur , no gallop  , no rub, nl s1 s2                           - JVD- none , edema- none, stasis changes- none, varices- none           Lung- +Diminished/ clear now, unlabored.  Wheeze -None, cough-none,                                                 dullness-none,  rub- none           Chest wall-  Abd-  Br/ Gen/ Rectal- Not done, not indicated Extrem- cyanosis- none, clubbing, none, atrophy- none, strength- nl Neuro- grossly intact to observation

## 2017-01-06 NOTE — Patient Instructions (Addendum)
Order- needs replacement nebulizer machine   Dx asthma severe persistent  Order DME [facility]- new CPAP machine auto 5-20, mask of choice- suggest pillows or nasal mask                                               Supplies, humidifier, AirView   Dx OSA  Order- lab- Allergy profile     We will consider whether Berna Bue could be an option for you instead of Xolair  Flu vax  Script sent refilling Ruthe Mannan

## 2017-01-06 NOTE — Telephone Encounter (Signed)
Transitional [facility] Clinic Post-discharge Follow-Up Phone Call:  Date of Discharge: 01/02/2017 Principal Discharge Diagnosis(es):  Acute exacerbation of asthma. Post-discharge Communication: (Clearly document all attempts clearly and date contact made) call placed to the patient # [phone] and a HIPAA compliant voicemail message was left requesting a call back to # [phone] Call Completed: No

## 2017-01-07 ENCOUNTER — Ambulatory Visit: Payer: Medicaid Other | Admitting: Family Medicine

## 2017-01-07 LAB — RESPIRATORY ALLERGY PROFILE REGION II ~~LOC~~
Allergen, A. alternata, m6: 1.1 kU/L — ABNORMAL HIGH
Allergen, Cedar tree, t12: <0.1 kU/L
Allergen, Comm Silver Birch, t9: <0.1 kU/L
Allergen, Cottonwood, t14: <0.1 kU/L
Allergen, D pternoyssinus,d7: 1.01 kU/L — ABNORMAL HIGH
Allergen, Mouse Urine Protein, e78: <0.1 kU/L
Allergen, Mulberry, t76: <0.1 kU/L
Allergen, P. notatum, m1: 0.1 kU/L — ABNORMAL HIGH
Aspergillus fumigatus, m3: 0.37 kU/L — ABNORMAL HIGH
CLADOSPORIUM HERBARUM (M2) IGE: 0.34 kU/L — AB
CLASS: 0
CLASS: 0
CLASS: 0
CLASS: 0
CLASS: 0
CLASS: 0
CLASS: 0
CLASS: 0
CLASS: 0
CLASS: 0
CLASS: 0
CLASS: 0
Cat Dander: <0.1 kU/L
Class: 0
Class: 0
Class: 0
Class: 0
Class: 0
Class: 0
Class: 0
Class: 0
Class: 1
Class: 2
Class: 2
Class: 2
Cockroach: 0.33 kU/L — ABNORMAL HIGH
D. farinae: 0.97 kU/L — ABNORMAL HIGH
Elm IgE: <0.1 kU/L
IgE (Immunoglobulin E), Serum: 110 kU/L (ref <=114)
Johnson Grass: <0.1 kU/L
Rough Pigweed  IgE: 0.1 kU/L — ABNORMAL HIGH

## 2017-01-07 LAB — INTERPRETATION:

## 2017-01-07 MED ORDER — OMALIZUMAB 150 MG ~~LOC~~ SOLR
225.0000 mg | SUBCUTANEOUS | Status: DC
  Administered 2017-01-06: 225 mg via SUBCUTANEOUS

## 2017-01-08 ENCOUNTER — Ambulatory Visit: Payer: Medicaid Other | Admitting: Adult Health

## 2017-01-08 ENCOUNTER — Telehealth: Payer: Self-pay

## 2017-01-08 NOTE — Telephone Encounter (Addendum)
Transitional [facility] Clinic Post-discharge Follow-Up Phone Call:  Date of Discharge: 01/01/2017 Principal Discharge Diagnosis(es): exacerbation of asthma, OSA, HTN Post-discharge Communication: (Clearly document all attempts clearly and date contact made)  Call placed to the patient Call Completed: Yes                    With Whom: Patient Interpreter Needed: No     Please check all that apply:  X  Patient is knowledgeable of his/her condition(s) and/or treatment. X  Patient is caring for self at home; but has assistance from family and friend when needed.  ? Patient is receiving assist at home from family and/or caregiver. Family and/or caregiver is knowledgeable of patient's condition(s) and/or treatment. X  Patient is receiving home health services. If so, name of agency. - no home health care at this time. A referral for PCS was made and she has been evaluated and is waiting for a determination of hours approved.      Medication Reconciliation:  ? Medication list reviewed with patient. ? Patient obtained all discharge medications. If not, why?  She said that she has all of her medications and is taking them as ordered. She said that reluctantly she is taking the prednisone.  She stated that she did not need to review the medication list and did not have any questions about the medications.  She stated that in addition to the prednisone she has the dulera and singulair.    Activities of Daily Living:  X  Independent ? Needs assist (describe; ? home DME used) ? [facility] (describe, ? home DME used)   Community resources in place for patient:  X   [facility] has been trying to contact her. She stated that she missed their calls and understands she needs to call back.  X  Home Health/Home DME - has a nebulizer  ? Assisted Living ? Support Group              Questions/Concerns discussed: She said that she has no questions/concerns at this time. She was agreeable to scheduling a follow up  appointment. She noted that she missed her appointment yesterday because she was running late with another appointment.  She requested an afternoon appointment and stated that she would have transportation to the clinic. The next afternoon appointment is 01/15/17 @ 1400 and she was agreeable with that date/time.   She has stated that she has been approved for medicaid transportation. She is still waiting for a decision about her disability application.   Reminded her that Christa See, LCSW [facility] is available for her if needed and she requested that [facility] contact her.   Message sent to Clearence Ped, LCSW noting that the patient would like her to call her ( patient)

## 2017-01-12 NOTE — Assessment & Plan Note (Signed)
She never got new CPAP machine after qualifying sleep study in March. Plan-emphasized weight loss. Order new CPAP machine with AutoPap 5-20

## 2017-01-12 NOTE — Assessment & Plan Note (Signed)
She has been off of Xolair. We will recheck IgE. Prednisone is suppressing eosinophils. She might do better with an IL 5 biologic like Nucala or Berna Bue that can be taken less often, instead of her bi-weekly Xolair. Dulera refilled. Flu vaccine

## 2017-01-15 ENCOUNTER — Ambulatory Visit: Payer: Medicaid Other | Attending: Family Medicine | Admitting: Family Medicine

## 2017-01-15 ENCOUNTER — Ambulatory Visit: Payer: Medicaid Other | Attending: Family Medicine | Admitting: Licensed Clinical Social Worker

## 2017-01-15 ENCOUNTER — Encounter: Payer: Self-pay | Admitting: Family Medicine

## 2017-01-15 ENCOUNTER — Telehealth: Payer: Self-pay

## 2017-01-15 VITALS — BP 138/88 | HR 101 | Temp 98.3°F | Ht 66.0 in | Wt 342.6 lb

## 2017-01-15 DIAGNOSIS — R42 Dizziness and giddiness: Secondary | ICD-10-CM | POA: Insufficient documentation

## 2017-01-15 DIAGNOSIS — I10 Essential (primary) hypertension: Secondary | ICD-10-CM | POA: Diagnosis not present

## 2017-01-15 DIAGNOSIS — F419 Anxiety disorder, unspecified: Secondary | ICD-10-CM | POA: Insufficient documentation

## 2017-01-15 DIAGNOSIS — F32A Depression, unspecified: Secondary | ICD-10-CM

## 2017-01-15 DIAGNOSIS — Z9889 Other specified postprocedural states: Secondary | ICD-10-CM | POA: Diagnosis not present

## 2017-01-15 DIAGNOSIS — F329 Major depressive disorder, single episode, unspecified: Secondary | ICD-10-CM | POA: Insufficient documentation

## 2017-01-15 DIAGNOSIS — Z6841 Body Mass Index (BMI) 40.0 and over, adult: Secondary | ICD-10-CM | POA: Diagnosis not present

## 2017-01-15 DIAGNOSIS — Z9851 Tubal ligation status: Secondary | ICD-10-CM | POA: Insufficient documentation

## 2017-01-15 DIAGNOSIS — R7303 Prediabetes: Secondary | ICD-10-CM | POA: Diagnosis not present

## 2017-01-15 DIAGNOSIS — J449 Chronic obstructive pulmonary disease, unspecified: Secondary | ICD-10-CM | POA: Diagnosis not present

## 2017-01-15 DIAGNOSIS — J4541 Moderate persistent asthma with (acute) exacerbation: Secondary | ICD-10-CM

## 2017-01-15 DIAGNOSIS — Z7984 Long term (current) use of oral hypoglycemic drugs: Secondary | ICD-10-CM | POA: Insufficient documentation

## 2017-01-15 DIAGNOSIS — Z79899 Other long term (current) drug therapy: Secondary | ICD-10-CM | POA: Diagnosis not present

## 2017-01-15 MED ORDER — MECLIZINE HCL 25 MG PO TABS: 25.0000 mg | ORAL_TABLET | Freq: Two times a day (BID) | ORAL | 1 refills | Status: DC | PRN

## 2017-01-15 MED ORDER — ALBUTEROL SULFATE (2.5 MG/3ML) 0.083% IN NEBU: 2.5000 mg | INHALATION_SOLUTION | RESPIRATORY_TRACT | 3 refills | Status: DC | PRN

## 2017-01-15 MED ORDER — METHYLPREDNISOLONE SODIUM SUCC 125 MG IJ SOLR
62.5000 mg | Freq: Once | INTRAMUSCULAR | Status: AC
  Administered 2017-01-15: 62.5 mg via INTRAMUSCULAR

## 2017-01-15 MED ORDER — FUROSEMIDE 20 MG PO TABS: 20.0000 mg | ORAL_TABLET | Freq: Every day | ORAL | 2 refills | Status: DC

## 2017-01-15 MED FILL — LOSARTAN POTASSIUM 100 MG T: 100 | 30 days supply | Qty: 30 | Fill #1

## 2017-01-15 MED FILL — METOPROLOL TARTRATE 25 MG T: 25 | 30 days supply | Qty: 60 | Fill #0

## 2017-01-15 MED FILL — PULMICORT 0.25 MG/2 ML RESP: 0.25 | 15 days supply | Qty: 60 | Fill #0

## 2017-01-15 MED FILL — ALBUTEROL 0.083% INHAL SOLN: (2.5 MG/3ML | 10 days supply | Qty: 180 | Fill #0

## 2017-01-15 MED FILL — AMLODIPINE BESYLATE 10 MG T: 10 | 30 days supply | Qty: 30 | Fill #1

## 2017-01-15 MED FILL — MECLIZINE 25 MG TABLET: 25 | 30 days supply | Qty: 60 | Fill #0

## 2017-01-15 MED FILL — FUROSEMIDE 20 MG TABLET: 20 | 30 days supply | Qty: 30 | Fill #0

## 2017-01-15 NOTE — Progress Notes (Signed)
Transitional care clinic  Hospitalization dates: 8/14 - 12/13/16;  8/25 - 12/22/16; 9/5-01/02/17   Subjective:  Patient ID: April [name], female    DOB: 1970-10-27  Age: 46 y.o. MRN: [id number]  CC: Asthma   HPI April [name]  is a 46 year old female with a history of morbid obesity, major depressive disorder, hypertension, Pre Diabetes (A1c 5.8), Asthma (On Xolair injections every 2 weeks), obstructive sleep apnea  with multiple hospitalizations for asthma exacerbation.    She had presented with worsening shortness of breath, hypoxia with oxygen saturation of 91% for which she was placed on nebulizer treatments and IV steroids. Last month she had two hospitalizations and was treated with Augmentin on the first admission; she has always been discharged on a prednisone taper but her symptom relapsed as soon as she completes her course of prednisone.  She presents today informing me she is yet to pick up her inhalers from the pharmacy due to cost. She noticed increased shortness of breath and wheezing and took her last 2 doses of prednisone yesterday. One week ago she had a visit with pulmonary at which time she received her Xolair shot; IgE level checked was normal.  She complains of dizziness on turning her head or changing positions and is unable to give the exact duration; denies ringing in her ears or ear pains.  She is also followed closely by the bariatric clinic at [facility].  Past Medical History:  Diagnosis Date  . Acanthosis nigricans   . Arthritis   . Asthma    Followed by Dr. Annamaria Boots (pulmonology); receives every other week omalizumab injections; has frequent exacerbations  . COPD (chronic obstructive pulmonary disease) ([place])    PFTs in 2002, FEV1/FVC 65, no post bronchodilater test done  . Depression   . GERD (gastroesophageal reflux disease)   . Headache(784.0)   . Helicobacter pylori (H. pylori) infection   . Hypertension, essential   . Insomnia   .  Menorrhagia   . Morbid obesity ([place])   . Obesity   . Seasonal allergies   . Shortness of breath   . Sleep apnea    Sleep study 2008 - mild OSA, not enough events to titrate CPAP  . Tobacco user     Past Surgical History:  Procedure Laterality Date  . BREAST REDUCTION SURGERY  09/2011  . TUBAL LIGATION  1996   bilateral    No Known Allergies   Outpatient Medications Prior to Visit  Medication Sig Dispense Refill  . acetaminophen-codeine (TYLENOL #3) 300-30 MG tablet Take 1 tablet by mouth every 12 (twelve) hours as needed for moderate pain. 30 tablet 0  . albuterol (PROAIR HFA) 108 (90 Base) MCG/ACT inhaler Inhale 2 puffs into the lungs every 6 (six) hours as needed for wheezing or shortness of breath. 1 Inhaler 12  . albuterol (PROVENTIL) (2.5 MG/3ML) 0.083% nebulizer solution Take 3 mLs (2.5 mg total) by nebulization every 4 (four) hours as needed for wheezing or shortness of breath. 150 mL 4  . amLODipine (NORVASC) 10 MG tablet Take 1 tablet (10 mg total) by mouth daily. 30 tablet 4  . arformoterol (BROVANA) 15 MCG/2ML NEBU Take 2 mLs (15 mcg total) by nebulization 2 (two) times daily. 120 mL 0  . benzonatate (TESSALON) 200 MG capsule Take 1 capsule (200 mg total) by mouth 3 (three) times daily as needed for cough. 30 capsule 0  . budesonide (PULMICORT) 0.25 MG/2ML nebulizer solution Take 2 mLs (0.25 mg total)  by nebulization 2 (two) times daily. 60 mL 0  . butalbital-acetaminophen-caffeine (FIORICET, ESGIC) 50-325-40 MG tablet Take 1-2 tablets by mouth every 6 (six) hours as needed for headache. 20 tablet 0  . fluticasone (FLONASE) 50 MCG/ACT nasal spray Place 2 sprays into both nostrils daily. 16 g 6  . guaiFENesin (MUCINEX) 600 MG 12 hr tablet Take 1 tablet (600 mg total) by mouth 2 (two) times daily. 30 tablet 0  . loratadine (CLARITIN) 10 MG tablet Take 1 tablet (10 mg total) by mouth daily. 90 tablet 3  . losartan (COZAAR) 100 MG tablet Take 1 tablet (100 mg total) by mouth  daily. 30 tablet 3  . metFORMIN (GLUCOPHAGE) 500 MG tablet Take 1 tablet (500 mg total) by mouth 2 (two) times daily with a meal. 60 tablet 3  . metoprolol tartrate (LOPRESSOR) 25 MG tablet Take 1 tablet (25 mg total) by mouth 2 (two) times daily. 60 tablet 4  . mometasone-formoterol (DULERA) 200-5 MCG/ACT AERO Inhale 2 puffs into the lungs 2 (two) times daily. Rinse mouth 1 Inhaler 12  . mometasone-formoterol (DULERA) 200-5 MCG/ACT AERO Inhale 2 puffs into the lungs 2 (two) times daily. 1 Inhaler 0  . montelukast (SINGULAIR) 10 MG tablet Take 1 tablet (10 mg total) by mouth at bedtime. 30 tablet 0  . Multiple Vitamin (MULTIVITAMIN WITH MINERALS) TABS tablet Take 1 tablet by mouth daily. One-A-Day    . nicotine (NICODERM CQ - DOSED IN MG/24 HOURS) 14 mg/24hr patch Place 1 patch (14 mg total) onto the skin daily. (Patient taking differently: Place 14 mg onto the skin daily as needed (smoking cessation). ) 28 patch 0  . omeprazole (PRILOSEC) 20 MG capsule Take 1 capsule (20 mg total) by mouth daily. (Patient taking differently: Take 20 mg by mouth 2 (two) times daily. ) 30 capsule 11  . sertraline (ZOLOFT) 100 MG tablet Take 1 tablet (100 mg total) by mouth daily. 60 tablet 3  . spironolactone (ALDACTONE) 50 MG tablet Take 1 tablet (50 mg total) by mouth daily. 60 tablet 2  . zolpidem (AMBIEN) 5 MG tablet Take 1 tablet (5 mg total) by mouth at bedtime as needed for sleep. 30 tablet 0  . furosemide (LASIX) 20 MG tablet Take 1 tablet (20 mg total) by mouth daily. 30 tablet 1  . metoCLOPramide (REGLAN) 10 MG tablet Take 1 tablet (10 mg total) by mouth every 6 (six) hours. (Patient taking differently: Take 10 mg by mouth every 6 (six) hours as needed for nausea or vomiting. ) 30 tablet 0  . predniSONE (DELTASONE) 10 MG tablet Take 4 tablets for 3 days; Take 3 tablets for 4 days; Take 2 tablets for 3 days; Take 1 tablet for 4 days 34 tablet 0  . senna-docusate (SENOKOT-S) 8.6-50 MG tablet Start 1 tab QHS, up  to 2tabs BID. Max 4 tabs daily for constipation (Patient taking differently: Take 1-2 tablets by mouth daily as needed for mild constipation. ) 15 tablet 0   Facility-Administered Medications Prior to Visit  Medication Dose Route Frequency Provider Last Rate Last Dose  . omalizumab Arvid Right) injection 225 mg  225 mg Subcutaneous Q14 Days Baird Lyons D, MD   225 mg at 01/06/17 0922    ROS Review of Systems  Constitutional: Negative for activity change, appetite change and fatigue.  HENT: Negative for congestion, sinus pressure and sore throat.   Eyes: Negative for visual disturbance.  Respiratory: Positive for shortness of breath and wheezing. Negative for cough and chest  tightness.   Cardiovascular: Negative for chest pain and palpitations.  Gastrointestinal: Negative for abdominal distention, abdominal pain and constipation.  Endocrine: Negative for polydipsia.  Genitourinary: Negative for dysuria and frequency.  Musculoskeletal: Negative for arthralgias and back pain.  Skin: Negative for rash.  Neurological: Positive for light-headedness. Negative for tremors and numbness.  Hematological: Does not bruise/bleed easily.  Psychiatric/Behavioral: Negative for agitation and behavioral problems.    Objective:  BP 138/88   Pulse (!) 101   Temp 98.3 F (36.8 C) (Oral)   Ht 5\' 6"  (1.676 m)   Wt (!) 342 lb 9.6 oz (155.4 kg)   SpO2 94%   BMI 55.30 kg/m   BP/Weight 01/15/2017 0/34/7425 01/01/6386  Systolic BP 564 332 951  Diastolic BP 88 76 69  Wt. (Lbs) 342.6 343.8 -  BMI 55.3 55.49 -  Some encounter information is confidential and restricted. Go to Review Flowsheets activity to see all data.      Physical Exam  Constitutional: She is oriented to person, place, and time. She appears well-developed and well-nourished.  Cardiovascular: Normal rate, normal heart sounds and intact distal pulses.   No murmur heard. Pulmonary/Chest: Effort normal. She has wheezes (slight bilateral  expiratory wheezes throughout lung fields). She has no rales. She exhibits no tenderness.  Abdominal: Soft. Bowel sounds are normal. She exhibits no distension and no mass. There is no tenderness.  Musculoskeletal: Normal range of motion.  Neurological: She is alert and oriented to person, place, and time.  Skin: Skin is warm and dry.  Psychiatric: She has a normal mood and affect.     Lab Results  Component Value Date   HGBA1C 5.8 (H) 12/11/2016    Assessment & Plan:   1. Moderate persistent asthma with (acute) exacerbation Uncontrolled with multiple hospitalizations for acute exacerbation She is a high risk patient with risk of readmissions She is currently out of all her medications-we have spoke with the pharmacy to ensure she gets them today Continue Xolair injections every 2 weeks Case manager assisting with this Oxygen saturation of 94%; we will gave intramuscular Solu-Medrol. She has been out of her medications to prevent an acute exacerbation. - methylPREDNISolone sodium succinate (SOLU-MEDROL) 125 mg/2 mL injection 62.5 mg; Inject 1 mL (62.5 mg total) into the muscle once.  2. Prediabetes Controlled on metformin with A1c of 5.8 Diabetic diet Due to frequent use of steroids she is at high risk of exacerbation  3. Essential hypertension Controlled  4. Anxiety and depression Stable  5. Morbid obesity with body mass index of 50.0-59.9 in adult [facility]) She has been unable to use loose weight despite dietary modifications Prednisone also contributory Continue with weight management clinic.  6. Vertigo Discussed sedating side effects of meclizine - meclizine (ANTIVERT) 25 MG tablet; Take 1 tablet (25 mg total) by mouth 2 (two) times daily as needed.  Dispense: 60 tablet; Refill: 1   Meds ordered this encounter  Medications  . furosemide (LASIX) 20 MG tablet    Sig: Take 1 tablet (20 mg total) by mouth daily.    Dispense:  30 tablet    Refill:  2  . meclizine  (ANTIVERT) 25 MG tablet    Sig: Take 1 tablet (25 mg total) by mouth 2 (two) times daily as needed.    Dispense:  60 tablet    Refill:  1  . methylPREDNISolone sodium succinate (SOLU-MEDROL) 125 mg/2 mL injection 62.5 mg    Follow-up: Return in about 3 weeks (around 02/05/2017) for  Follow-up on asthma and prediabetes.   Arnoldo Morale MD

## 2017-01-15 NOTE — Telephone Encounter (Signed)
Met with the patient when she was in the clinic today. She said that her cell phone is now working.  She completed a [employer] application while she was here and it was faxed to [employer] eligibility.  She said that she has been approved for 80 hours/month of PCS and she has the aide come almost every day.  She noted that she is not able to afford the co-pays for her medications as she is not working and does not receive disability. Informed her that she can get her medications at [facility] today and will not have to pay for them , the charges can be put on her account. This CM reviewed all medications on her AVS and she noted all of the medications that she is missing. Dr Jarold Song sent prescriptions for all medications that needed refills to [facility]. As per Crissie Reese, [place], she can pick up all medications today except the brovana as [place] does not carry that medication. Dr Jarold Song was informed of the lack of brovana and she stated that the patient should be instructed to contact her pulmonologist. The pharmacy did not have the complete refill for meclizine.  The patient would be given half of the prescription today and can pick up the remainder tomorrow. This CM instructed the patient to wait for all of her medications to be filled today and instructed her to contact her pulmonologist about the brovana.  These instructions were also provided to the patient in writing.   The patient stated that she received a call from [place] who would like her to have a glucometer. As per Dr Jarold Song, the patient does not need one at this time.  This information was shared with the patient.   This CM provided her with the CM contact # and encouraged her to call if she has any questions.   The patient also met with Christa See, LCSW/[facility]

## 2017-01-15 NOTE — Patient Instructions (Signed)
Benign Positional Vertigo Vertigo is the feeling that you or your surroundings are moving when they are not. Benign positional vertigo is the most common form of vertigo. The cause of this condition is not serious (is benign). This condition is triggered by certain movements and positions (is positional). This condition can be dangerous if it occurs while you are doing something that could endanger you or others, such as driving. What are the causes? In many cases, the cause of this condition is not known. It may be caused by a disturbance in an area of the inner ear that helps your brain to sense movement and balance. This disturbance can be caused by a viral infection (labyrinthitis), head injury, or repetitive motion. What increases the risk? This condition is more likely to develop in:  Women.  People who are 50 years of age or older.  What are the signs or symptoms? Symptoms of this condition usually happen when you move your head or your eyes in different directions. Symptoms may start suddenly, and they usually last for less than a minute. Symptoms may include:  Loss of balance and falling.  Feeling like you are spinning or moving.  Feeling like your surroundings are spinning or moving.  Nausea and vomiting.  Blurred vision.  Dizziness.  Involuntary eye movement (nystagmus).  Symptoms can be mild and cause only slight annoyance, or they can be severe and interfere with daily life. Episodes of benign positional vertigo may return (recur) over time, and they may be triggered by certain movements. Symptoms may improve over time. How is this diagnosed? This condition is usually diagnosed by medical history and a physical exam of the head, neck, and ears. You may be referred to a health care provider who specializes in ear, nose, and throat (ENT) problems (otolaryngologist) or a provider who specializes in disorders of the nervous system (neurologist). You may have additional testing,  including:  MRI.  A CT scan.  Eye movement tests. Your health care provider may ask you to change positions quickly while he or she watches you for symptoms of benign positional vertigo, such as nystagmus. Eye movement may be tested with an electronystagmogram (ENG), caloric stimulation, the Dix-Hallpike test, or the roll test.  An electroencephalogram (EEG). This records electrical activity in your brain.  Hearing tests.  How is this treated? Usually, your health care provider will treat this by moving your head in specific positions to adjust your inner ear back to normal. Surgery may be needed in severe cases, but this is rare. In some cases, benign positional vertigo may resolve on its own in 2-4 weeks. Follow these instructions at home: Safety  Move slowly.Avoid sudden body or head movements.  Avoid driving.  Avoid operating heavy machinery.  Avoid doing any tasks that would be dangerous to you or others if a vertigo episode would occur.  If you have trouble walking or keeping your balance, try using a cane for stability. If you feel dizzy or unstable, sit down right away.  Return to your normal activities as told by your health care provider. Ask your health care provider what activities are safe for you. General instructions  Take over-the-counter and prescription medicines only as told by your health care provider.  Avoid certain positions or movements as told by your health care provider.  Drink enough fluid to keep your urine clear or pale yellow.  Keep all follow-up visits as told by your health care provider. This is important. Contact a health care   provider if:  You have a fever.  Your condition gets worse or you develop new symptoms.  Your family or friends notice any behavioral changes.  Your nausea or vomiting gets worse.  You have numbness or a "pins and needles" sensation. Get help right away if:  You have difficulty speaking or moving.  You are  always dizzy.  You faint.  You develop severe headaches.  You have weakness in your legs or arms.  You have changes in your hearing or vision.  You develop a stiff neck.  You develop sensitivity to light. This information is not intended to replace advice given to you by your health care provider. Make sure you discuss any questions you have with your health care provider. Document Released: 01/21/2006 Document Revised: 09/21/2015 Document Reviewed: 08/08/2014 Elsevier Interactive Patient Education  2018 [employer]  

## 2017-01-16 NOTE — BH Specialist Note (Signed)
Integrated Behavioral Health Follow Up Visit  MRN: [id number] Name: G[facility]DS [employer]  Number of [place] Clinician visits: 2/6 Session Start time: 2:30 PM  Session End time: 3:00 PM Total time: 30 minutes  Type of Service: [place] Interpretor:No. Interpretor Name and Language: N/A  SUBJECTIVE: April [name] is a 46 y.o. female accompanied by self Patient was referred by Dr. Jarold Song for anxiety and depression. Patient reports the following symptoms/concerns: overwhelming feelings of sadness and worry, difficulty sleeping, racing thoughts, low motivation, withdrawn behavior, and difficulty concentrating Duration of problem: Ongoing; Severity of problem: severe  OBJECTIVE: Mood: Pleasant and Affect: Appropriate Risk of harm to self or others: No plan to harm self or others  LIFE CONTEXT: Family and Social: Pt receives support from adult daughters, boyfriend, and mother. She is close to her grandchildren School/Work: Pt is unemployed. She receives food stamps [phone]) and has applied for disability (pending hearing date) Self-Care: Pt participates in medication management. She stated that she attends classes on weight loss; however, continues to gain weight due to ongoing stress Life Changes: Pt has ongoing medical concerns, financial strain, and conflict with family  GOALS ADDRESSED: Patient will: 1.  Reduce symptoms of: anxiety, depression and stress  2.  Increase knowledge and/or ability of: coping skills and healthy habits  3.  Demonstrate ability to: Increase adequate support systems for patient/family and Improve medication compliance  INTERVENTIONS: Interventions utilized:  Solution-Focused Strategies and Supportive Counseling Standardized Assessments completed: GAD-7 and PHQ 2&9  ASSESSMENT: Patient currently experiencing anxiety and depression triggered by ongoing medical concerns and financial strain. Pt reported difficulty  affording medications due to financial strain. She disclosed frustration and embarrassment from difficulty managing health.   Patient may benefit from psychotherapy. She was recently approved for PCS and completed SCAT application with RN CM. LCSWA provided therapeutic interventions to assist pt in decreasing symptoms. Pt is aware of resources to assist with housing and financial strain.  PLAN: 1. Follow up with behavioral health clinician on : Pt was encouraged tocontact LCSWA if symptoms worsen or fail to improveto schedule behavioral appointments at [facility]. 2. Behavioral recommendations: LCSWA recommends that pt apply healthy coping skills discussed, comply with medication management, and utilize provided resources. Pt is encouraged to schedule follow up appointment with LCSWA 3. Referral(s): [place] (LME/Outside Clinic) 4. "From scale of 1-10, how likely are you to follow plan?": 7/10  Rebekah Chesterfield, LCSW 01/17/17 11:50 AM

## 2017-01-17 ENCOUNTER — Encounter (HOSPITAL_COMMUNITY): Payer: Self-pay

## 2017-01-17 ENCOUNTER — Telehealth: Payer: Self-pay

## 2017-01-17 ENCOUNTER — Inpatient Hospital Stay (HOSPITAL_COMMUNITY)
Admission: EM | Admit: 2017-01-17 | Discharge: 2017-01-17 | DRG: 202 | Disposition: A | Discharge status: Home / Self Care | Payer: Medicaid Other | Attending: Internal Medicine | Admitting: Internal Medicine

## 2017-01-17 ENCOUNTER — Emergency Department (HOSPITAL_COMMUNITY): Payer: Medicaid Other

## 2017-01-17 DIAGNOSIS — I5032 Chronic diastolic (congestive) heart failure: Secondary | ICD-10-CM | POA: Diagnosis not present

## 2017-01-17 DIAGNOSIS — D72829 Elevated white blood cell count, unspecified: Secondary | ICD-10-CM | POA: Diagnosis present

## 2017-01-17 DIAGNOSIS — J449 Chronic obstructive pulmonary disease, unspecified: Secondary | ICD-10-CM | POA: Diagnosis present

## 2017-01-17 DIAGNOSIS — J4551 Severe persistent asthma with (acute) exacerbation: Secondary | ICD-10-CM | POA: Diagnosis not present

## 2017-01-17 DIAGNOSIS — F330 Major depressive disorder, recurrent, mild: Secondary | ICD-10-CM | POA: Diagnosis not present

## 2017-01-17 DIAGNOSIS — E669 Obesity, unspecified: Secondary | ICD-10-CM | POA: Diagnosis present

## 2017-01-17 DIAGNOSIS — Z6841 Body Mass Index (BMI) 40.0 and over, adult: Secondary | ICD-10-CM

## 2017-01-17 DIAGNOSIS — R7303 Prediabetes: Secondary | ICD-10-CM | POA: Diagnosis present

## 2017-01-17 DIAGNOSIS — I110 Hypertensive heart disease with heart failure: Secondary | ICD-10-CM | POA: Diagnosis present

## 2017-01-17 DIAGNOSIS — R6510 Systemic inflammatory response syndrome (SIRS) of non-infectious origin without acute organ dysfunction: Secondary | ICD-10-CM | POA: Diagnosis present

## 2017-01-17 DIAGNOSIS — J45901 Unspecified asthma with (acute) exacerbation: Secondary | ICD-10-CM | POA: Diagnosis present

## 2017-01-17 DIAGNOSIS — F329 Major depressive disorder, single episode, unspecified: Secondary | ICD-10-CM | POA: Diagnosis present

## 2017-01-17 DIAGNOSIS — Z7984 Long term (current) use of oral hypoglycemic drugs: Secondary | ICD-10-CM

## 2017-01-17 DIAGNOSIS — Z8249 Family history of ischemic heart disease and other diseases of the circulatory system: Secondary | ICD-10-CM | POA: Diagnosis not present

## 2017-01-17 DIAGNOSIS — Z87891 Personal history of nicotine dependence: Secondary | ICD-10-CM

## 2017-01-17 DIAGNOSIS — G4733 Obstructive sleep apnea (adult) (pediatric): Secondary | ICD-10-CM | POA: Diagnosis present

## 2017-01-17 DIAGNOSIS — K219 Gastro-esophageal reflux disease without esophagitis: Secondary | ICD-10-CM | POA: Diagnosis present

## 2017-01-17 DIAGNOSIS — Z825 Family history of asthma and other chronic lower respiratory diseases: Secondary | ICD-10-CM

## 2017-01-17 DIAGNOSIS — I5033 Acute on chronic diastolic (congestive) heart failure: Secondary | ICD-10-CM | POA: Diagnosis present

## 2017-01-17 DIAGNOSIS — I10 Essential (primary) hypertension: Secondary | ICD-10-CM | POA: Diagnosis not present

## 2017-01-17 DIAGNOSIS — J441 Chronic obstructive pulmonary disease with (acute) exacerbation: Secondary | ICD-10-CM

## 2017-01-17 DIAGNOSIS — J4541 Moderate persistent asthma with (acute) exacerbation: Principal | ICD-10-CM | POA: Diagnosis present

## 2017-01-17 HISTORY — DX: Chronic diastolic (congestive) heart failure: I50.32

## 2017-01-17 LAB — LACTIC ACID, PLASMA
LACTIC ACID, VENOUS: 4.7 mmol/L — AB (ref 0.5–1.9)
LACTIC ACID, VENOUS: 3.6 mmol/L — AB (ref 0.5–1.9)
Lactic Acid, Venous: 3.1 mmol/L (ref 0.5–1.9)

## 2017-01-17 LAB — CBC WITH DIFFERENTIAL/PLATELET
BASOS PCT: 0 %
Basophils Absolute: 0.1 10*3/uL (ref 0.0–0.1)
EOS ABS: 0.1 10*3/uL (ref 0.0–0.7)
EOS PCT: 0 %
HEMATOCRIT: 37 % (ref 36.0–46.0)
Hemoglobin: 11.7 g/dL — ABNORMAL LOW (ref 12.0–15.0)
Lymphocytes Relative: 19 %
Lymphs Abs: 4.3 10*3/uL — ABNORMAL HIGH (ref 0.7–4.0)
MCH: 28.1 pg (ref 26.0–34.0)
MCHC: 31.6 g/dL (ref 30.0–36.0)
MCV: 88.9 fL (ref 78.0–100.0)
MONO ABS: 1.9 10*3/uL — AB (ref 0.1–1.0)
MONOS PCT: 9 %
NEUTROS ABS: 15.9 10*3/uL — AB (ref 1.7–7.7)
Neutrophils Relative %: 72 %
Platelets: 331 10*3/uL (ref 150–400)
RBC: 4.16 MIL/uL (ref 3.87–5.11)
RDW: 20.7 % — AB (ref 11.5–15.5)
WBC: 22.2 10*3/uL — ABNORMAL HIGH (ref 4.0–10.5)

## 2017-01-17 LAB — RESPIRATORY PANEL BY PCR
Adenovirus: NOT DETECTED
BORDETELLA PERTUSSIS-RVPCR: NOT DETECTED
Chlamydophila pneumoniae: NOT DETECTED
Coronavirus 229E: NOT DETECTED
Coronavirus HKU1: NOT DETECTED
Coronavirus NL63: NOT DETECTED
Coronavirus OC43: NOT DETECTED
INFLUENZA A H1 2009-RVPPR: NOT DETECTED
INFLUENZA A H1-RVPPCR: NOT DETECTED
Influenza A H3: NOT DETECTED
Influenza A: NOT DETECTED
Influenza B: NOT DETECTED
METAPNEUMOVIRUS-RVPPCR: NOT DETECTED
Mycoplasma pneumoniae: NOT DETECTED
PARAINFLUENZA VIRUS 2-RVPPCR: NOT DETECTED
PARAINFLUENZA VIRUS 3-RVPPCR: NOT DETECTED
Parainfluenza Virus 1: NOT DETECTED
Parainfluenza Virus 4: NOT DETECTED
RESPIRATORY SYNCYTIAL VIRUS-RVPPCR: NOT DETECTED
RHINOVIRUS / ENTEROVIRUS - RVPPCR: NOT DETECTED

## 2017-01-17 LAB — COMPREHENSIVE METABOLIC PANEL
ALBUMIN: 2.8 g/dL — AB (ref 3.5–5.0)
ALK PHOS: 54 U/L (ref 38–126)
ALT: 19 U/L (ref 14–54)
AST: 24 U/L (ref 15–41)
Anion gap: 12 (ref 5–15)
BUN: 7 mg/dL (ref 6–20)
CALCIUM: 8.4 mg/dL — AB (ref 8.9–10.3)
CO2: 22 mmol/L (ref 22–32)
CREATININE: 0.97 mg/dL (ref 0.44–1.00)
Chloride: 103 mmol/L (ref 101–111)
GFR calc Af Amer: >60 mL/min (ref >60)
GFR calc non Af Amer: >60 mL/min (ref >60)
GLUCOSE: 122 mg/dL — AB (ref 65–99)
Potassium: 3.7 mmol/L (ref 3.5–5.1)
Sodium: 137 mmol/L (ref 135–145)
Total Bilirubin: 0.4 mg/dL (ref 0.3–1.2)
Total Protein: 5.4 g/dL — ABNORMAL LOW (ref 6.5–8.1)

## 2017-01-17 LAB — I-STAT ARTERIAL BLOOD GAS, ED
Acid-base deficit: 5 mmol/L — ABNORMAL HIGH (ref 0.0–2.0)
Bicarbonate: 19.9 mmol/L — ABNORMAL LOW (ref 20.0–28.0)
O2 SAT: 98 %
TCO2: 21 mmol/L — AB (ref 22–32)
pCO2 arterial: 35.1 mmHg (ref 32.0–48.0)
pH, Arterial: 7.362 (ref 7.350–7.450)
pO2, Arterial: 103 mmHg (ref 83.0–108.0)

## 2017-01-17 LAB — I-STAT CG4 LACTIC ACID, ED
Lactic Acid, Venous: 2.98 mmol/L (ref 0.5–1.9)
Lactic Acid, Venous: 2.12 mmol/L (ref 0.5–1.9)

## 2017-01-17 LAB — BRAIN NATRIURETIC PEPTIDE: B Natriuretic Peptide: 19.7 pg/mL (ref 0.0–100.0)

## 2017-01-17 LAB — PROCALCITONIN: Procalcitonin: <0.1 ng/mL

## 2017-01-17 LAB — MRSA PCR SCREENING: MRSA BY PCR: POSITIVE — AB

## 2017-01-17 MED ORDER — SERTRALINE HCL 100 MG PO TABS
100.0000 mg | ORAL_TABLET | Freq: Every day | ORAL | Status: DC
  Administered 2017-01-17: 100 mg via ORAL
  Filled 2017-01-17 (×2): qty 1

## 2017-01-17 MED ORDER — ALBUTEROL SULFATE (2.5 MG/3ML) 0.083% IN NEBU
INHALATION_SOLUTION | RESPIRATORY_TRACT | Status: AC
  Filled 2017-01-17: qty 6

## 2017-01-17 MED ORDER — IPRATROPIUM BROMIDE 0.02 % IN SOLN
1.0000 mg | RESPIRATORY_TRACT | Status: DC
  Administered 2017-01-17 (×3): 1 mg via RESPIRATORY_TRACT
  Filled 2017-01-17 (×3): qty 5

## 2017-01-17 MED ORDER — FLUTICASONE PROPIONATE 50 MCG/ACT NA SUSP
2.0000 | Freq: Every day | NASAL | Status: DC
  Administered 2017-01-17: 2 via NASAL
  Filled 2017-01-17: qty 16

## 2017-01-17 MED ORDER — ALBUTEROL (5 MG/ML) CONTINUOUS INHALATION SOLN
15.0000 mg/h | INHALATION_SOLUTION | RESPIRATORY_TRACT | Status: DC
  Administered 2017-01-17: 15 mg/h via RESPIRATORY_TRACT
  Filled 2017-01-17: qty 20

## 2017-01-17 MED ORDER — LEVALBUTEROL HCL 1.25 MG/0.5ML IN NEBU
1.2500 mg | INHALATION_SOLUTION | Freq: Four times a day (QID) | RESPIRATORY_TRACT | Status: DC
  Filled 2017-01-17: qty 0.5

## 2017-01-17 MED ORDER — SODIUM CHLORIDE 0.9 % IV SOLN
INTRAVENOUS | Status: DC
  Administered 2017-01-17: 03:00:00 via INTRAVENOUS

## 2017-01-17 MED ORDER — AMLODIPINE BESYLATE 10 MG PO TABS
10.0000 mg | ORAL_TABLET | Freq: Every day | ORAL | Status: DC
  Administered 2017-01-17: 10 mg via ORAL
  Filled 2017-01-17: qty 2

## 2017-01-17 MED ORDER — METHYLPREDNISOLONE SODIUM SUCC 125 MG IJ SOLR
60.0000 mg | Freq: Three times a day (TID) | INTRAMUSCULAR | Status: DC
  Administered 2017-01-17: 60 mg via INTRAVENOUS
  Filled 2017-01-17: qty 2

## 2017-01-17 MED ORDER — ZOLPIDEM TARTRATE 5 MG PO TABS: 5.0000 mg | ORAL_TABLET | Freq: Every evening | ORAL | Status: DC | PRN

## 2017-01-17 MED ORDER — ONDANSETRON HCL 4 MG PO TABS: 4.0000 mg | ORAL_TABLET | Freq: Four times a day (QID) | ORAL | Status: DC | PRN

## 2017-01-17 MED ORDER — FUROSEMIDE 10 MG/ML IJ SOLN
40.0000 mg | Freq: Once | INTRAMUSCULAR | Status: AC
  Administered 2017-01-17: 40 mg via INTRAVENOUS
  Filled 2017-01-17: qty 4

## 2017-01-17 MED ORDER — SODIUM CHLORIDE 0.9 % IV SOLN
INTRAVENOUS | Status: DC
  Administered 2017-01-17: 07:00:00 via INTRAVENOUS

## 2017-01-17 MED ORDER — BUTALBITAL-APAP-CAFFEINE 50-325-40 MG PO TABS: 1.0000 | ORAL_TABLET | Freq: Four times a day (QID) | ORAL | Status: DC | PRN

## 2017-01-17 MED ORDER — MECLIZINE HCL 12.5 MG PO TABS: 25.0000 mg | ORAL_TABLET | Freq: Two times a day (BID) | ORAL | Status: DC | PRN

## 2017-01-17 MED ORDER — ALBUTEROL SULFATE (2.5 MG/3ML) 0.083% IN NEBU
5.0000 mg | INHALATION_SOLUTION | Freq: Once | RESPIRATORY_TRACT | Status: AC
  Administered 2017-01-17: 5 mg via RESPIRATORY_TRACT

## 2017-01-17 MED ORDER — FUROSEMIDE 40 MG PO TABS: 40.0000 mg | ORAL_TABLET | Freq: Every day | ORAL | 0 refills | Status: DC

## 2017-01-17 MED ORDER — METHYLPREDNISOLONE SODIUM SUCC 125 MG IJ SOLR
125.0000 mg | Freq: Once | INTRAMUSCULAR | Status: AC
  Administered 2017-01-17: 125 mg via INTRAVENOUS
  Filled 2017-01-17: qty 2

## 2017-01-17 MED ORDER — ENOXAPARIN SODIUM 80 MG/0.8ML ~~LOC~~ SOLN
80.0000 mg | SUBCUTANEOUS | Status: DC
  Filled 2017-01-17: qty 0.8

## 2017-01-17 MED ORDER — IPRATROPIUM BROMIDE 0.02 % IN SOLN
1.0000 mg | Freq: Once | RESPIRATORY_TRACT | Status: AC
  Administered 2017-01-17: 1 mg via RESPIRATORY_TRACT
  Filled 2017-01-17: qty 5

## 2017-01-17 MED ORDER — SODIUM CHLORIDE 0.9 % IV BOLUS (SEPSIS)
1000.0000 mL | Freq: Once | INTRAVENOUS | Status: AC
  Administered 2017-01-17: 1000 mL via INTRAVENOUS

## 2017-01-17 MED ORDER — SPIRONOLACTONE 100 MG PO TABS: 100.0000 mg | ORAL_TABLET | Freq: Every day | ORAL | 0 refills | Status: DC

## 2017-01-17 MED ORDER — MAGNESIUM SULFATE 2 GM/50ML IV SOLN
2.0000 g | Freq: Once | INTRAVENOUS | Status: AC
  Administered 2017-01-17: 2 g via INTRAVENOUS
  Filled 2017-01-17: qty 50

## 2017-01-17 MED ORDER — GUAIFENESIN ER 600 MG PO TB12
600.0000 mg | ORAL_TABLET | Freq: Two times a day (BID) | ORAL | Status: DC
  Administered 2017-01-17: 600 mg via ORAL
  Filled 2017-01-17: qty 1

## 2017-01-17 MED ORDER — IPRATROPIUM-ALBUTEROL 0.5-2.5 (3) MG/3ML IN SOLN: 3.0000 mL | Freq: Once | RESPIRATORY_TRACT | Status: DC

## 2017-01-17 MED ORDER — IPRATROPIUM BROMIDE 0.02 % IN SOLN
1.0000 mg | Freq: Three times a day (TID) | RESPIRATORY_TRACT | Status: DC
  Administered 2017-01-17: 0.5 mg via RESPIRATORY_TRACT
  Filled 2017-01-17: qty 5

## 2017-01-17 MED ORDER — PANTOPRAZOLE SODIUM 40 MG PO TBEC
40.0000 mg | DELAYED_RELEASE_TABLET | Freq: Every day | ORAL | Status: DC
  Administered 2017-01-17: 40 mg via ORAL
  Filled 2017-01-17: qty 1

## 2017-01-17 MED ORDER — LOSARTAN POTASSIUM 50 MG PO TABS
100.0000 mg | ORAL_TABLET | Freq: Every day | ORAL | Status: DC
  Administered 2017-01-17: 100 mg via ORAL
  Filled 2017-01-17: qty 2

## 2017-01-17 MED ORDER — HYDRALAZINE HCL 20 MG/ML IJ SOLN: 5.0000 mg | INTRAMUSCULAR | Status: DC | PRN

## 2017-01-17 MED ORDER — NICOTINE 14 MG/24HR TD PT24
14.0000 mg | MEDICATED_PATCH | Freq: Every day | TRANSDERMAL | Status: DC
  Administered 2017-01-17: 14 mg via TRANSDERMAL
  Filled 2017-01-17: qty 1

## 2017-01-17 MED ORDER — ACETAMINOPHEN 325 MG PO TABS: 650.0000 mg | ORAL_TABLET | Freq: Four times a day (QID) | ORAL | Status: DC | PRN

## 2017-01-17 MED ORDER — LORATADINE 10 MG PO TABS
10.0000 mg | ORAL_TABLET | Freq: Every day | ORAL | Status: DC
  Administered 2017-01-17: 10 mg via ORAL
  Filled 2017-01-17: qty 1

## 2017-01-17 MED ORDER — METFORMIN HCL 500 MG PO TABS: 500.0000 mg | ORAL_TABLET | Freq: Two times a day (BID) | ORAL | Status: DC

## 2017-01-17 MED ORDER — ONDANSETRON HCL 4 MG/2ML IJ SOLN: 4.0000 mg | Freq: Four times a day (QID) | INTRAMUSCULAR | Status: DC | PRN

## 2017-01-17 MED ORDER — MONTELUKAST SODIUM 10 MG PO TABS: 10.0000 mg | ORAL_TABLET | Freq: Every day | ORAL | Status: DC

## 2017-01-17 MED ORDER — SODIUM CHLORIDE 0.9 % IV SOLN
Freq: Once | INTRAVENOUS | Status: AC
  Administered 2017-01-17: 08:00:00 via INTRAVENOUS

## 2017-01-17 MED ORDER — LEVALBUTEROL HCL 1.25 MG/0.5ML IN NEBU
1.2500 mg | INHALATION_SOLUTION | Freq: Three times a day (TID) | RESPIRATORY_TRACT | Status: DC
  Administered 2017-01-17: 1.25 mg via RESPIRATORY_TRACT
  Filled 2017-01-17: qty 0.5

## 2017-01-17 MED ORDER — METOPROLOL TARTRATE 25 MG PO TABS
25.0000 mg | ORAL_TABLET | Freq: Two times a day (BID) | ORAL | Status: DC
  Administered 2017-01-17: 25 mg via ORAL
  Filled 2017-01-17: qty 1

## 2017-01-17 MED ORDER — PREDNISONE 20 MG PO TABS: 40.0000 mg | ORAL_TABLET | Freq: Every day | ORAL | 0 refills | Status: DC

## 2017-01-17 NOTE — Progress Notes (Signed)
Patient walked to the nurses station and asked "can I walk out there," patient geusturing outside the unit.  RN informed patient that due to safety risks patients are not allowed to ambulate off the unit, pt. aknowledged understanding.  RN notified by telemetry that patients HR increased into the 140's but was not reading well at that time the RN went into the patients room and she was not there.  RN then searched the entire unit and the patient was no longer on the unit, at this time the patients monitor is no longer reading out.  Security notified that patient has left the unit and staff cannot locate her description of patient given.  MD notified.

## 2017-01-17 NOTE — ED Notes (Signed)
Textpage sent to MD    Darcey Nora Room A10   lactic Acid is now 3.1. I was 2.12 at [place]' RN

## 2017-01-17 NOTE — Telephone Encounter (Signed)
The patient is known to the [facility] and was just seen in the clinic on 01/15/17. At that time she had not been taking any of her cardiac or pulmonary medications as she had been out of them. All prescriptions except for the brovana were filled at the time the patient was in the clinic.    Update provided to April Justin, RN CM

## 2017-01-17 NOTE — ED Notes (Signed)
Attempted report,  

## 2017-01-17 NOTE — ED Notes (Signed)
Patient given a drink.

## 2017-01-17 NOTE — ED Notes (Signed)
MD paged,    Patient Sats drop to 83% patient states she wears a CPAP at home. Place on 2L stas now 100%.

## 2017-01-17 NOTE — ED Triage Notes (Signed)
Patient arrives with complaint of shortness of breath. States onset a few days ago. Wheezing audible from door in triage. Breath sounds severely diminished on left, profound wheezing on right. Tachycardic and complaining of lightheadedness. States home asthma remedies have been futile.

## 2017-01-17 NOTE — ED Notes (Signed)
Pt ambulatory to restroom with steady gait; pt's oxygen sats were 98% after returning to the room

## 2017-01-17 NOTE — ED Notes (Signed)
RN advised patient sill waiting on bed. Patient understood.

## 2017-01-17 NOTE — ED Notes (Signed)
Patient ambulated to restroom.

## 2017-01-17 NOTE — Discharge Summary (Signed)
Physician Discharge Summary  April [name] BJY:[id number] DOB: 1971/01/31 DOA: 01/17/2017  PCP: Arnoldo Morale, MD  Admit date: 01/17/2017 Discharge date: 01/17/2017  Admitted From: home  Disposition:  home  Recommendations for Outpatient Follow-up:  1. Follow up with PCP early next week 2. Prednisone burst prescribed and lasix increased  Home Health:  none  Equipment/Devices:  None  Discharge Condition:  Stable, improved CODE STATUS:  Full code  Diet recommendation:  Low sodium   Brief/Interim Summary:  April [name] is a 46 y.o. female with medical history significant of hypertension, COPD, asthma, GERD, depression, OSA on CPAP, morbid obesity, dCHF, prediabetes, who presents with shortness of breath and wheezing.  She was recently hospitalized from 09/5-9/6 because of asthma exacerbation. She has been doing fine until today when patient started having worsening shortness breath and wheezing again.  She was seen by her PCP two days ago and given a shot of solumedrol and a prescription for lasix.  She did not pick up her lasix prescription yet but came to the ER for breathing problems.  In the emergency department, she was given fluid boluses however that made her more dyspneic. I was called by the nurse in the emergency department to come reassess the patient due to decline in her respiratory status. Her IV fluids were discontinued and she was started on IV Lasix after which she started to void frequently. She was admitted to the stepdown unit for close monitoring, however after her initial diuresis she must been feeling better because she walks off the unit with a friend on room air against the nurse's instructions.  Clearly if she is well enough to ambulate independently, she is not requiring hospital services at this time and therefore can be discharged with a prescription for prednisone for a few more days for possible asthma exacerbation and a prescription for a higher dose of Lasix to take  until she can follow back up with her primary care doctor.  Discharge Diagnoses:  Principal Problem:   Moderate persistent asthma with exacerbation Active Problems:   Essential hypertension   Leukocytosis   Prediabetes   GERD (gastroesophageal reflux disease)   Chronic diastolic CHF (congestive heart failure) (HCC)   SIRS (systemic inflammatory response syndrome) (HCC)   Depression   OSA (obstructive sleep apnea)   Asthma exacerbation  Moderate persistent asthma with exacerbation: Patient has dry cough, shortness breath, wheezing, negative chest x-ray, consistent with asthma exacerbation. No oxygen saturation desaturation. Patient received one dose of magnesium sulfate.  Her respiratory viral panel was negative. She was given up her prescription for prednisone 40 mg daily 5 days. She may continue to use her home Singulair and breathing treatments. -  Consider stopping metoprolol and ARB if these are felt to be contributing to her asthma flares.  Defer to pulmonology and PCP -  On Xolair, dulera, pulmicort, and brovana per Dr. Annamaria Boots, but may be changing her therapy according to his last note  Dyspnea.  Her last echocardiogram was 10/03/2016 with mild LVH, ejection fraction 55-60 percent, grade 1 diastolic dysfunction. Her right ventricular systolic function was normal. She had no significant valvular abnormalities. I suspect that she has a component of acute on chronic diastolic heart failure contributing to her dyspnea. This would also plan wire lactic acid rose despite IV fluids in the emergency department. -  Increase to Lasix 40 mg by mouth daily  -  Increase home spironolactone to 100mg  daily  Depression:  Stable, no suicidal or homicidal ideations. -  Continued Zoloft  Essential hypertension, blood pressure elevated.  ontinued metoprolol, amlodipine, Cozaar.    GERD, stable, continue protonix  Prediabetes: A1c 5.8 on 12/11/16. Patient taking metformin at home. Blood sugar 122.   D/c metformin due to elevated lactic acid.    Obesity, patient declined by bariatric center due to ongoing weight gain  OSA, patient awaiting her new CPAP machine  Leukocytosis due to solumedrol two days ago.  Discharge Instructions  Discharge Instructions    (HEART FAILURE PATIENTS) Call MD:  Anytime you have any of the following symptoms: 1) 3 pound weight gain in 24 hours or 5 pounds in 1 week 2) shortness of breath, with or without a dry hacking cough 3) swelling in the hands, feet or stomach 4) if you have to sleep on extra pillows at night in order to breathe.    Complete by:  As directed    Call MD for:  difficulty breathing, headache or visual disturbances    Complete by:  As directed    Call MD for:  persistant dizziness or light-headedness    Complete by:  As directed    Diet - low sodium heart healthy    Complete by:  As directed    Increase activity slowly    Complete by:  As directed        Medication List    STOP taking these medications   acetaminophen-codeine 300-30 MG tablet Commonly known as:  TYLENOL #3   meclizine 25 MG tablet Commonly known as:  ANTIVERT   metFORMIN 500 MG tablet Commonly known as:  GLUCOPHAGE     TAKE these medications   albuterol 108 (90 Base) MCG/ACT inhaler Commonly known as:  PROAIR HFA Inhale 2 puffs into the lungs every 6 (six) hours as needed for wheezing or shortness of breath.   albuterol (2.5 MG/3ML) 0.083% nebulizer solution Commonly known as:  PROVENTIL Take 3 mLs (2.5 mg total) by nebulization every 4 (four) hours as needed for wheezing or shortness of breath.   amLODipine 10 MG tablet Commonly known as:  NORVASC Take 1 tablet (10 mg total) by mouth daily.   arformoterol 15 MCG/2ML Nebu Commonly known as:  BROVANA Take 2 mLs (15 mcg total) by nebulization 2 (two) times daily.   benzonatate 200 MG capsule Commonly known as:  TESSALON Take 1 capsule (200 mg total) by mouth 3 (three) times daily as needed for  cough.   budesonide 0.25 MG/2ML nebulizer solution Commonly known as:  PULMICORT Take 2 mLs (0.25 mg total) by nebulization 2 (two) times daily.   butalbital-acetaminophen-caffeine 50-325-40 MG tablet Commonly known as:  FIORICET, ESGIC Take 1-2 tablets by mouth every 6 (six) hours as needed for headache.   fluticasone 50 MCG/ACT nasal spray Commonly known as:  FLONASE Place 2 sprays into both nostrils daily.   furosemide 40 MG tablet Commonly known as:  LASIX Take 1 tablet (40 mg total) by mouth daily. What changed:  medication strength  how much to take   guaiFENesin 600 MG 12 hr tablet Commonly known as:  MUCINEX Take 1 tablet (600 mg total) by mouth 2 (two) times daily.   loratadine 10 MG tablet Commonly known as:  CLARITIN Take 1 tablet (10 mg total) by mouth daily.   losartan 100 MG tablet Commonly known as:  COZAAR Take 1 tablet (100 mg total) by mouth daily.   metoprolol tartrate 25 MG tablet Commonly known as:  LOPRESSOR Take 1 tablet (25 mg total) by mouth 2 (  two) times daily.   mometasone-formoterol 200-5 MCG/ACT Aero Commonly known as:  DULERA Inhale 2 puffs into the lungs 2 (two) times daily. What changed:  Another medication with the same name was removed. Continue taking this medication, and follow the directions you see here.   montelukast 10 MG tablet Commonly known as:  SINGULAIR Take 1 tablet (10 mg total) by mouth at bedtime.   nicotine 14 mg/24hr patch Commonly known as:  NICODERM CQ - dosed in mg/24 hours Place 1 patch (14 mg total) onto the skin daily. What changed:  when to take this  reasons to take this   omeprazole 20 MG capsule Commonly known as:  PRILOSEC Take 1 capsule (20 mg total) by mouth daily. What changed:  when to take this   predniSONE 20 MG tablet Commonly known as:  DELTASONE Take 2 tablets (40 mg total) by mouth daily with breakfast.   sertraline 100 MG tablet Commonly known as:  ZOLOFT Take 1 tablet (100 mg  total) by mouth daily.   spironolactone 100 MG tablet Commonly known as:  ALDACTONE Take 1 tablet (100 mg total) by mouth daily. What changed:  medication strength  how much to take   zolpidem 5 MG tablet Commonly known as:  AMBIEN Take 1 tablet (5 mg total) by mouth at bedtime as needed for sleep.      Follow-up Information    Arnoldo Morale, MD. Schedule an appointment as soon as possible for a visit in 3 day(s).   Specialty:  Family Medicine Contact information: [place] [address] [phone]        Deneise Lever, MD. Schedule an appointment as soon as possible for a visit in 1 week(s).   Specialty:  Pulmonary Disease Contact information: [place] [address] [phone]          No Known Allergies  Consultations: none   Procedures/Studies: Dg Chest 2 View  Result Date: 01/17/2017 CLINICAL DATA:  Shortness of breath for 2 days EXAM: CHEST  2 VIEW COMPARISON:  01/01/2017 FINDINGS: The heart size and mediastinal contours are within normal limits. Both lungs are clear. The visualized skeletal structures are unremarkable. IMPRESSION: No active cardiopulmonary disease. Electronically Signed   By: Donavan Foil M.D.   On: 01/17/2017 02:00   Dg Chest 2 View  Result Date: 01/01/2017 CLINICAL DATA:  Shortness of breath. Asthma for 3 days. History of hypertension. Smoker. EXAM: CHEST  2 VIEW COMPARISON:  12/21/2016 FINDINGS: Normal heart size and pulmonary vascularity. No focal airspace disease or consolidation in the lungs. No blunting of costophrenic angles. No pneumothorax. Mediastinal contours appear intact. Old left rib fracture. IMPRESSION: No active cardiopulmonary disease. Electronically Signed   By: Lucienne Capers M.D.   On: 01/01/2017 03:45   Dg Chest 2 View  Result Date: 12/21/2016 CLINICAL DATA:  Asthma attack.  Wheezing. EXAM: CHEST  2 VIEW COMPARISON:  December 18, 2016 FINDINGS: The heart size and mediastinal  contours are within normal limits. Both lungs are clear. The visualized skeletal structures are unremarkable. IMPRESSION: No active cardiopulmonary disease. Electronically Signed   By: Dorise Bullion III M.D   On: 12/21/2016 10:49   Dg Chest 2 View  Result Date: 12/18/2016 CLINICAL DATA:  Wheezing EXAM: CHEST  2 VIEW COMPARISON:  12/10/2016 FINDINGS: The heart size and mediastinal contours are within normal limits. Mild increase in interstitial prominence and slight peribronchial thickening seen to better advantage on the lateral view. Findings likely reflect  chronic bronchitic change. No alveolar consolidations are noted. Subtle ground-glass appearance of the lungs may reflect an alveolitis or pneumonitis. The visualized skeletal structures are unremarkable. IMPRESSION: Stable mild diffuse bronchitic change with faint ground-glass appearance of the lungs that may reflect an alveolitis/pneumonitis or possibly some hypoventilatory change of the lungs. Electronically Signed   By: Ashley Royalty M.D.   On: 12/18/2016 23:19   Ct Head Wo Contrast  Result Date: 12/18/2016 CLINICAL DATA:  Acute onset was superior right-sided headache. Worst headache of life. EXAM: CT HEAD WITHOUT CONTRAST TECHNIQUE: Contiguous axial images were obtained from the base of the skull through the vertex without intravenous contrast. COMPARISON:  Head CT 10/20/2016 FINDINGS: Brain: No intracranial hemorrhage, mass effect, or midline shift. No hydrocephalus. The basilar cisterns are patent. No evidence of territorial infarct or acute ischemia. No extra-axial or intracranial fluid collection. Vascular: No hyperdense vessel or unexpected calcification. Skull: Normal. Negative for fracture or focal lesion. Sinuses/Orbits: Paranasal sinuses and mastoid air cells are clear. The visualized orbits are unremarkable. Other: None. IMPRESSION: Unremarkable noncontrast head CT. Electronically Signed   By: Jeb Levering M.D.   On: 12/18/2016 22:50     Subjective: Still SOB, similar to her previous admissions.  Feels wheezy and breathing treatments are not helping much.    Discharge Exam: Vitals:   01/17/17 1447 01/17/17 1514  BP:  127/77  Pulse: 81 81  Resp: (!) 22 (!) 22  Temp:    SpO2: 100% 100%   Vitals:   01/17/17 1345 01/17/17 1413 01/17/17 1447 01/17/17 1514  BP: (!) 166/91   127/77  Pulse:   81 81  Resp: (!) 24 (!) 21 (!) 22 (!) 22  Temp:      TempSrc:      SpO2:   100% 100%    General: Obese female, tachypneic with mild SCM retractions in ER.  Able to speak in sentences, however.  Cardiovascular: RRR, S1/S2 +, no rubs, no gallops Respiratory: Diminished at bases.  Rales heard across the room.  Wheezing throughout, no rhonchi Abdominal: Soft, NT, ND, bowel sounds + Extremities: 1+ pitting bilateral lower extremity edema, no cyanosis    The results of significant diagnostics from this hospitalization (including imaging, microbiology, ancillary and laboratory) are listed below for reference.     Microbiology: Recent Results (from the past 240 hour(s))  Respiratory Panel by PCR     Status: None   Collection Time: 01/17/17 10:59 AM  Result Value Ref Range Status   Adenovirus NOT DETECTED NOT DETECTED Final   Coronavirus 229E NOT DETECTED NOT DETECTED Final   Coronavirus HKU1 NOT DETECTED NOT DETECTED Final   Coronavirus NL63 NOT DETECTED NOT DETECTED Final   Coronavirus OC43 NOT DETECTED NOT DETECTED Final   Metapneumovirus NOT DETECTED NOT DETECTED Final   Rhinovirus / Enterovirus NOT DETECTED NOT DETECTED Final   Influenza A NOT DETECTED NOT DETECTED Final   Influenza A H1 NOT DETECTED NOT DETECTED Final   Influenza A H1 2009 NOT DETECTED NOT DETECTED Final   Influenza A H3 NOT DETECTED NOT DETECTED Final   Influenza B NOT DETECTED NOT DETECTED Final   Parainfluenza Virus 1 NOT DETECTED NOT DETECTED Final   Parainfluenza Virus 2 NOT DETECTED NOT DETECTED Final   Parainfluenza Virus 3 NOT DETECTED NOT  DETECTED Final   Parainfluenza Virus 4 NOT DETECTED NOT DETECTED Final   Respiratory Syncytial Virus NOT DETECTED NOT DETECTED Final   Bordetella pertussis NOT DETECTED NOT DETECTED Final   Chlamydophila  pneumoniae NOT DETECTED NOT DETECTED Final   Mycoplasma pneumoniae NOT DETECTED NOT DETECTED Final     Labs: BNP (last 3 results)  Recent Labs  06/21/16 1136 11/13/16 1926 01/17/17 0623  BNP 37.3 43.7 41.9   Basic Metabolic Panel:  Recent Labs Lab 01/17/17 0117  NA 137  K 3.7  CL 103  CO2 22  GLUCOSE 122*  BUN 7  CREATININE 0.97  CALCIUM 8.4*   Liver Function Tests:  Recent Labs Lab 01/17/17 0117  AST 24  ALT 19  ALKPHOS 54  BILITOT 0.4  PROT 5.4*  ALBUMIN 2.8*   No results for input(s): LIPASE, AMYLASE in the last 168 hours. No results for input(s): AMMONIA in the last 168 hours. CBC:  Recent Labs Lab 01/17/17 0117  WBC 22.2*  NEUTROABS 15.9*  HGB 11.7*  HCT 37.0  MCV 88.9  PLT 331   Cardiac Enzymes: No results for input(s): CKTOTAL, CKMB, CKMBINDEX, TROPONINI in the last 168 hours. BNP: Invalid input(s): POCBNP CBG: No results for input(s): GLUCAP in the last 168 hours. D-Dimer No results for input(s): DDIMER in the last 72 hours. Hgb A1c No results for input(s): HGBA1C in the last 72 hours. Lipid Profile No results for input(s): CHOL, HDL, LDLCALC, TRIG, CHOLHDL, LDLDIRECT in the last 72 hours. Thyroid function studies No results for input(s): TSH, T4TOTAL, T3FREE, THYROIDAB in the last 72 hours.  Invalid input(s): FREET3 Anemia work up No results for input(s): VITAMINB12, FOLATE, FERRITIN, TIBC, IRON, RETICCTPCT in the last 72 hours. Urinalysis    Component Value Date/Time   COLORURINE YELLOW 01/01/2017 0935   APPEARANCEUR HAZY (A) 01/01/2017 0935   LABSPEC 1.012 01/01/2017 0935   PHURINE 5.0 01/01/2017 0935   GLUCOSEU 50 (A) 01/01/2017 0935   GLUCOSEU NEG mg/dL 10/28/2007 2049   HGBUR NEGATIVE 01/01/2017 0935   BILIRUBINUR  NEGATIVE 01/01/2017 0935   KETONESUR NEGATIVE 01/01/2017 0935   PROTEINUR NEGATIVE 01/01/2017 0935   UROBILINOGEN 1.0 11/21/2014 0707   NITRITE NEGATIVE 01/01/2017 0935   LEUKOCYTESUR NEGATIVE 01/01/2017 0935   Sepsis Labs Invalid input(s): PROCALCITONIN,  WBC,  LACTICIDVEN   Time coordinating discharge: Over 30 minutes  SIGNED:   Janece Canterbury, MD  [facility] 01/17/2017, 4:30 PM Pager   If 7PM-7AM, please contact night-coverage [email or web address] Password [email or web address]

## 2017-01-17 NOTE — Progress Notes (Signed)
RN unable to perform through skin assessment. Patient refused RN to assess skin. Patient refuses CHG wipes. Patient educated on the importance of CHG wipes and skin assessment. Continues to refuse.    Clyde Canterbury, RN

## 2017-01-17 NOTE — ED Notes (Signed)
Dr Niu at bedside 

## 2017-01-17 NOTE — H&P (Addendum)
History and Physical    April [name]:[id number] DOB: 1970-08-01 DOA: 01/17/2017  Referring MD/NP/PA:   PCP: Arnoldo Morale, MD   Patient coming from:  The patient is coming from home.  At baseline, pt is independent for most of ADL  Chief Complaint: Shortness of breath and wheezing   HPI: April [name] is a 46 y.o. female with medical history significant of hypertension, COPD, asthma, GERD, depression, OSA on CPAP, morbid obesity, dCHF, prediabetes, who presents with shortness of breath and wheezing.  Patient was recently hospitalized from 09/5-9/6 because of asthma exacerbation. She has been doing fine until today when patient started having worsening shortness breath and wheezing again. She has dry cough, no chest pain. She has subjective fever and chills. She can barely speak in full sentence. Home inhaler does not help per patient. Patient denies nausea, vomiting, diarrhea, abdominal pain, symptoms of UTI or unilateral weakness.  ED Course: pt was found to have WBC 12.1, lactic acid of 2.98, 2.12, electrolytes renal function okay, negative chest x-ray, temperature normal, tachycardia, tachypnea, oxygen saturation 100 % on room air. Patient is placed on telemetry bed for observation.  Review of Systems:   General: has subjective fevers, chills, no body weight gain, has poor appetite, has fatigue HEENT: no blurry vision, hearing changes or sore throat Respiratory: has dyspnea, coughing, wheezing CV: no chest pain, no palpitations GI: no nausea, vomiting, abdominal pain, diarrhea, constipation GU: no dysuria, burning on urination, increased urinary frequency, hematuria  Ext: has mild leg edema Neuro: no unilateral weakness, numbness, or tingling, no vision change or hearing loss Skin: no rash, no skin tear. MSK: No muscle spasm, no deformity, no limitation of range of movement in spin Heme: No easy bruising.  Travel history: No recent long distant travel.  Allergy: No Known  Allergies  Past Medical History:  Diagnosis Date  . Acanthosis nigricans   . Arthritis   . Asthma    Followed by Dr. Annamaria Boots (pulmonology); receives every other week omalizumab injections; has frequent exacerbations  . COPD (chronic obstructive pulmonary disease) ([place])    PFTs in 2002, FEV1/FVC 65, no post bronchodilater test done  . Depression   . GERD (gastroesophageal reflux disease)   . Headache(784.0)   . Helicobacter pylori (H. pylori) infection   . Hypertension, essential   . Insomnia   . Menorrhagia   . Morbid obesity ([place])   . Obesity   . Seasonal allergies   . Shortness of breath   . Sleep apnea    Sleep study 2008 - mild OSA, not enough events to titrate CPAP  . Tobacco user     Past Surgical History:  Procedure Laterality Date  . BREAST REDUCTION SURGERY  09/2011  . TUBAL LIGATION  1996   bilateral    Social History:  reports that she quit smoking about 2 years ago. Her smoking use included Cigarettes. She has a 9.00 pack-year smoking history. She has never used smokeless tobacco. She reports that she does not drink alcohol or use drugs.  Family History:  Family History  Problem Relation Age of Onset  . Hypertension Mother   . Asthma Daughter   . Cancer Paternal Aunt   . Asthma Maternal Grandmother      Prior to Admission medications   Medication Sig Start Date End Date Taking? Authorizing Provider  albuterol (PROAIR HFA) 108 (90 Base) MCG/ACT inhaler Inhale 2 puffs into the lungs every 6 (six) hours as needed for wheezing or shortness of  breath. 12/13/16  Yes Rai, Ripudeep K, MD  albuterol (PROVENTIL) (2.5 MG/3ML) 0.083% nebulizer solution Take 3 mLs (2.5 mg total) by nebulization every 4 (four) hours as needed for wheezing or shortness of breath. 01/15/17  Yes Arnoldo Morale, MD  amLODipine (NORVASC) 10 MG tablet Take 1 tablet (10 mg total) by mouth daily. 12/13/16  Yes Rai, Ripudeep K, MD  arformoterol (BROVANA) 15 MCG/2ML NEBU Take 2 mLs (15 mcg total) by  nebulization 2 (two) times daily. 01/02/17  Yes Patrecia Pour, Christean Grief, MD  budesonide (PULMICORT) 0.25 MG/2ML nebulizer solution Take 2 mLs (0.25 mg total) by nebulization 2 (two) times daily. 01/02/17  Yes Patrecia Pour, Christean Grief, MD  butalbital-acetaminophen-caffeine (FIORICET, ESGIC) [phone] MG tablet Take 1-2 tablets by mouth every 6 (six) hours as needed for headache. 12/19/16 12/19/17 Yes Joy, Shawn C, PA-C  fluticasone (FLONASE) 50 MCG/ACT nasal spray Place 2 sprays into both nostrils daily. 12/13/16  Yes Rai, Ripudeep K, MD  furosemide (LASIX) 20 MG tablet Take 1 tablet (20 mg total) by mouth daily. 01/15/17  Yes Arnoldo Morale, MD  guaiFENesin (MUCINEX) 600 MG 12 hr tablet Take 1 tablet (600 mg total) by mouth 2 (two) times daily. 12/13/16  Yes Rai, Ripudeep K, MD  loratadine (CLARITIN) 10 MG tablet Take 1 tablet (10 mg total) by mouth daily. 08/30/15  Yes [name], MD  losartan (COZAAR) 100 MG tablet Take 1 tablet (100 mg total) by mouth daily. 12/13/16  Yes Rai, Ripudeep K, MD  meclizine (ANTIVERT) 25 MG tablet Take 1 tablet (25 mg total) by mouth 2 (two) times daily as needed. 01/15/17  Yes Arnoldo Morale, MD  metFORMIN (GLUCOPHAGE) 500 MG tablet Take 1 tablet (500 mg total) by mouth 2 (two) times daily with a meal. 12/13/16  Yes Amao, Charlane Ferretti, MD  metoprolol tartrate (LOPRESSOR) 25 MG tablet Take 1 tablet (25 mg total) by mouth 2 (two) times daily. 12/27/16  Yes Amao, Charlane Ferretti, MD  mometasone-formoterol (DULERA) 200-5 MCG/ACT AERO Inhale 2 puffs into the lungs 2 (two) times daily. 01/06/17  Yes Young, Tarri Fuller D, MD  montelukast (SINGULAIR) 10 MG tablet Take 1 tablet (10 mg total) by mouth at bedtime. 01/02/17  Yes Patrecia Pour, Christean Grief, MD  nicotine (NICODERM CQ - DOSED IN MG/24 HOURS) 14 mg/24hr patch Place 1 patch (14 mg total) onto the skin daily. Patient taking differently: Place 14 mg onto the skin daily as needed (smoking cessation).  10/04/16  Yes Reyne Dumas, MD  omeprazole (PRILOSEC) 20 MG capsule Take  1 capsule (20 mg total) by mouth daily. Patient taking differently: Take 20 mg by mouth 2 (two) times daily.  10/09/16  Yes Young, Tarri Fuller D, MD  sertraline (ZOLOFT) 100 MG tablet Take 1 tablet (100 mg total) by mouth daily. 12/27/16  Yes Arnoldo Morale, MD  spironolactone (ALDACTONE) 50 MG tablet Take 1 tablet (50 mg total) by mouth daily. 10/04/16  Yes Reyne Dumas, MD  zolpidem (AMBIEN) 5 MG tablet Take 1 tablet (5 mg total) by mouth at bedtime as needed for sleep. 12/22/16  Yes Mikhail, Velta Addison, DO  acetaminophen-codeine (TYLENOL #3) 300-30 MG tablet Take 1 tablet by mouth every 12 (twelve) hours as needed for moderate pain. Patient not taking: Reported on 01/17/2017 12/13/16   Arnoldo Morale, MD  benzonatate (TESSALON) 200 MG capsule Take 1 capsule (200 mg total) by mouth 3 (three) times daily as needed for cough. Patient not taking: Reported on 01/17/2017 12/13/16   Rai, Vernelle Emerald, MD  mometasone-formoterol (DULERA) 200-5 MCG/ACT AERO  Inhale 2 puffs into the lungs 2 (two) times daily. Rinse mouth Patient not taking: Reported on 01/17/2017 01/06/17   Deneise Lever, MD    Physical Exam: Vitals:   01/17/17 0230 01/17/17 0241 01/17/17 0245 01/17/17 0300  BP: (!) 146/92  138/84 135/87  Pulse: (!) 109  (!) 105 (!) 103  Resp: (!) 25  (!) 26 (!) 25  Temp:      TempSrc:      SpO2: 99% 98% 97% 100%   General: Not in acute distress HEENT:       Eyes: PERRL, EOMI, no scleral icterus.       ENT: No discharge from the ears and nose, no pharynx injection, no tonsillar enlargement.        Neck: No JVD, no bruit, no mass felt. Heme: No neck lymph node enlargement. Cardiac: S1/S2, RRR, No murmurs, No gallops or rubs. Respiratory: Decreased air movement bilaterally. Diffuse wheezing bilaterally.  GI: Soft, nondistended, nontender, no rebound pain, no organomegaly, BS present. GU: No hematuria Ext: Trace pitting leg edema bilaterally. 2+DP/PT pulse bilaterally. Musculoskeletal: No joint deformities, No  joint redness or warmth, no limitation of ROM in spin. Skin: No rashes.  Neuro: Alert, oriented X3, cranial nerves II-XII grossly intact, moves all extremities normally. Psych: Patient is not psychotic, no suicidal or hemocidal ideation.  Labs on Admission: I have personally reviewed following labs and imaging studies  CBC:  Recent Labs Lab 01/17/17 0117  WBC 22.2*  NEUTROABS 15.9*  HGB 11.7*  HCT 37.0  MCV 88.9  PLT 161   Basic Metabolic Panel:  Recent Labs Lab 01/17/17 0117  NA 137  K 3.7  CL 103  CO2 22  GLUCOSE 122*  BUN 7  CREATININE 0.97  CALCIUM 8.4*   GFR: Estimated Creatinine Clearance: 114.2 mL/min (by C-G formula based on SCr of 0.97 mg/dL). Liver Function Tests:  Recent Labs Lab 01/17/17 0117  AST 24  ALT 19  ALKPHOS 54  BILITOT 0.4  PROT 5.4*  ALBUMIN 2.8*   No results for input(s): LIPASE, AMYLASE in the last 168 hours. No results for input(s): AMMONIA in the last 168 hours. Coagulation Profile: No results for input(s): INR, PROTIME in the last 168 hours. Cardiac Enzymes: No results for input(s): CKTOTAL, CKMB, CKMBINDEX, TROPONINI in the last 168 hours. BNP (last 3 results) No results for input(s): PROBNP in the last 8760 hours. HbA1C: No results for input(s): HGBA1C in the last 72 hours. CBG: No results for input(s): GLUCAP in the last 168 hours. Lipid Profile: No results for input(s): CHOL, HDL, LDLCALC, TRIG, CHOLHDL, LDLDIRECT in the last 72 hours. Thyroid Function Tests: No results for input(s): TSH, T4TOTAL, FREET4, T3FREE, THYROIDAB in the last 72 hours. Anemia Panel: No results for input(s): VITAMINB12, FOLATE, FERRITIN, TIBC, IRON, RETICCTPCT in the last 72 hours. Urine analysis:    Component Value Date/Time   COLORURINE YELLOW 01/01/2017 0935   APPEARANCEUR HAZY (A) 01/01/2017 0935   LABSPEC 1.012 01/01/2017 0935   PHURINE 5.0 01/01/2017 0935   GLUCOSEU 50 (A) 01/01/2017 0935   GLUCOSEU NEG mg/dL 10/28/2007 2049    HGBUR NEGATIVE 01/01/2017 0935   BILIRUBINUR NEGATIVE 01/01/2017 0935   KETONESUR NEGATIVE 01/01/2017 0935   PROTEINUR NEGATIVE 01/01/2017 0935   UROBILINOGEN 1.0 11/21/2014 0707   NITRITE NEGATIVE 01/01/2017 0935   LEUKOCYTESUR NEGATIVE 01/01/2017 0935   Sepsis Labs: @LABRCNTIP (procalcitonin:4,lacticidven:4) )No results found for this or any previous visit (from the past 240 hour(s)).   Radiological Exams on Admission:  Dg Chest 2 View  Result Date: 01/17/2017 CLINICAL DATA:  Shortness of breath for 2 days EXAM: CHEST  2 VIEW COMPARISON:  01/01/2017 FINDINGS: The heart size and mediastinal contours are within normal limits. Both lungs are clear. The visualized skeletal structures are unremarkable. IMPRESSION: No active cardiopulmonary disease. Electronically Signed   By: Donavan Foil M.D.   On: 01/17/2017 02:00     EKG: Independently reviewed.  Sinus rhythm, QTC 459, anteroseptal infarction pattern.    Assessment/Plan Principal Problem:   Moderate persistent asthma with exacerbation Active Problems:   Essential hypertension   Leukocytosis   Prediabetes   GERD (gastroesophageal reflux disease)   Chronic diastolic CHF (congestive heart failure) ([facility])   SIRS (systemic inflammatory response syndrome) ([facility])   Depression   OSA (obstructive sleep apnea)   Moderate persistent asthma with exacerbation: Patient has dry cough, shortness breath, wheezing, negative chest x-ray, consistent with asthma exacerbation. No oxygen saturation desaturation. Patient received one dose of magnesium sulfate -will place on tele bed for obs -Scheduled Atrovent and prn Xopenex nebs -Solu-Medrol 60 mg IV q8h  -Mucinex for cough  -respiratory virus panel -Continue home Singulair  Depression:  Stable, no suicidal or homicidal ideations. -Continue home medications: Zoloft  Essential hypertension: -Continue home metoprolol, amlodipine, Cozaar -IV hydralazine when  necessary  GERD: -Protonix  Chronic diastolic CHF (congestive heart failure) [facility]): Patient has 1+ leg edema, but no pulmonary edema on chest x-ray. CHF seems to compensated. 2 echo on 10/03/16 showed EF of 50-50 percent with grade 1 diastolic dysfunction. -Hold her Lasix and spironolactone due to SIRS -check BNP -Continue metoprolol  SIRS: Patient meets criteria for SIRS with leukocytosis, tachycardia and tachypnea. No fever. Her leukocytosis is chronic issue, most likely due to steroid use. Clinically patient does not seem to have sepsis. Lactic acid 2.98, 2.12 -will get Procalcitonin and trend lactic acid levels per sepsis protocol. -IVF: 2L of NS bolus in ED, followed by 75 cc/h   Prediabetes: A1c 5.8 on 12/11/16. Patient taking metformin at home. Blood sugar 122. -Hold her metformin due to elevated lactic acid   DVT ppx: SQ Lovenox Code Status: Full code Family Communication: None at bed side. Disposition Plan:  Anticipate discharge back to previous home environment Consults called:  None Admission status: Obs / tele     Date of Service 01/17/2017    [name] [facility] Pager [phone]  If 7PM-7AM, please contact night-coverage [email or web address] Password [facility] 01/17/2017, 5:34 AM

## 2017-01-17 NOTE — ED Provider Notes (Signed)
TIME SEEN: 2:22 AM  CHIEF COMPLAINT: shortness of breath, asthma exacerbation  HPI: Pt is a 46 y.o. female with history of asthma followed by Dr. Annamaria Boots with pulmonology, hypertension, obesity who presents to the emergency department with shortness of breath and wheezing for the past couple of days. Reports this feels similar to previous asthma exacerbations. No fevers, cough, chest pain. No lower extremity swelling or pain. Has been on BiPAP before but never intubated. He is intubated frequently.  ROS: See HPI Constitutional: no fever  Eyes: no drainage  ENT: no runny nose   Cardiovascular:  no chest pain  Resp: no SOB  GI: no vomiting GU: no dysuria Integumentary: no rash  Allergy: no hives  Musculoskeletal: no leg swelling  Neurological: no slurred speech ROS otherwise negative  PAST MEDICAL HISTORY/PAST SURGICAL HISTORY:  Past Medical History:  Diagnosis Date  . Acanthosis nigricans   . Arthritis   . Asthma    Followed by Dr. Annamaria Boots (pulmonology); receives every other week omalizumab injections; has frequent exacerbations  . COPD (chronic obstructive pulmonary disease) ([place])    PFTs in 2002, FEV1/FVC 65, no post bronchodilater test done  . Depression   . GERD (gastroesophageal reflux disease)   . Headache(784.0)   . Helicobacter pylori (H. pylori) infection   . Hypertension, essential   . Insomnia   . Menorrhagia   . Morbid obesity ([place])   . Obesity   . Seasonal allergies   . Shortness of breath   . Sleep apnea    Sleep study 2008 - mild OSA, not enough events to titrate CPAP  . Tobacco user     MEDICATIONS:  Prior to Admission medications   Medication Sig Start Date End Date Taking? Authorizing Provider  acetaminophen-codeine (TYLENOL #3) 300-30 MG tablet Take 1 tablet by mouth every 12 (twelve) hours as needed for moderate pain. 12/13/16   Arnoldo Morale, MD  albuterol (PROAIR HFA) 108 (90 Base) MCG/ACT inhaler Inhale 2 puffs into the lungs every 6 (six) hours as  needed for wheezing or shortness of breath. 12/13/16   Rai, Ripudeep K, MD  albuterol (PROVENTIL) (2.5 MG/3ML) 0.083% nebulizer solution Take 3 mLs (2.5 mg total) by nebulization every 4 (four) hours as needed for wheezing or shortness of breath. 01/15/17   Arnoldo Morale, MD  amLODipine (NORVASC) 10 MG tablet Take 1 tablet (10 mg total) by mouth daily. 12/13/16   Rai, Ripudeep Raliegh Ip, MD  arformoterol (BROVANA) 15 MCG/2ML NEBU Take 2 mLs (15 mcg total) by nebulization 2 (two) times daily. 01/02/17   Doreatha Lew, MD  benzonatate (TESSALON) 200 MG capsule Take 1 capsule (200 mg total) by mouth 3 (three) times daily as needed for cough. 12/13/16   Rai, Ripudeep K, MD  budesonide (PULMICORT) 0.25 MG/2ML nebulizer solution Take 2 mLs (0.25 mg total) by nebulization 2 (two) times daily. 01/02/17   Patrecia Pour, Christean Grief, MD  butalbital-acetaminophen-caffeine (FIORICET, ESGIC) [phone] MG tablet Take 1-2 tablets by mouth every 6 (six) hours as needed for headache. 12/19/16 12/19/17  Joy, Shawn C, PA-C  fluticasone (FLONASE) 50 MCG/ACT nasal spray Place 2 sprays into both nostrils daily. 12/13/16   Rai, Vernelle Emerald, MD  furosemide (LASIX) 20 MG tablet Take 1 tablet (20 mg total) by mouth daily. 01/15/17   Arnoldo Morale, MD  guaiFENesin (MUCINEX) 600 MG 12 hr tablet Take 1 tablet (600 mg total) by mouth 2 (two) times daily. 12/13/16   Rai, Vernelle Emerald, MD  loratadine (CLARITIN) 10 MG tablet Take  1 tablet (10 mg total) by mouth daily. 08/30/15   [name], MD  losartan (COZAAR) 100 MG tablet Take 1 tablet (100 mg total) by mouth daily. 12/13/16   Rai, Vernelle Emerald, MD  meclizine (ANTIVERT) 25 MG tablet Take 1 tablet (25 mg total) by mouth 2 (two) times daily as needed. 01/15/17   Arnoldo Morale, MD  metFORMIN (GLUCOPHAGE) 500 MG tablet Take 1 tablet (500 mg total) by mouth 2 (two) times daily with a meal. 12/13/16   Arnoldo Morale, MD  metoprolol tartrate (LOPRESSOR) 25 MG tablet Take 1 tablet (25 mg total) by mouth 2 (two) times  daily. 12/27/16   Arnoldo Morale, MD  mometasone-formoterol (DULERA) 200-5 MCG/ACT AERO Inhale 2 puffs into the lungs 2 (two) times daily. Rinse mouth 01/06/17   Young, Tarri Fuller D, MD  mometasone-formoterol [facility]) 200-5 MCG/ACT AERO Inhale 2 puffs into the lungs 2 (two) times daily. 01/06/17   Baird Lyons D, MD  montelukast (SINGULAIR) 10 MG tablet Take 1 tablet (10 mg total) by mouth at bedtime. 01/02/17   Doreatha Lew, MD  Multiple Vitamin (MULTIVITAMIN WITH MINERALS) TABS tablet Take 1 tablet by mouth daily. One-A-Day    [provider]  nicotine (NICODERM CQ - DOSED IN MG/24 HOURS) 14 mg/24hr patch Place 1 patch (14 mg total) onto the skin daily. Patient taking differently: Place 14 mg onto the skin daily as needed (smoking cessation).  10/04/16   Reyne Dumas, MD  omeprazole (PRILOSEC) 20 MG capsule Take 1 capsule (20 mg total) by mouth daily. Patient taking differently: Take 20 mg by mouth 2 (two) times daily.  10/09/16   Baird Lyons D, MD  sertraline (ZOLOFT) 100 MG tablet Take 1 tablet (100 mg total) by mouth daily. 12/27/16   Arnoldo Morale, MD  spironolactone (ALDACTONE) 50 MG tablet Take 1 tablet (50 mg total) by mouth daily. 10/04/16   Reyne Dumas, MD  zolpidem (AMBIEN) 5 MG tablet Take 1 tablet (5 mg total) by mouth at bedtime as needed for sleep. 12/22/16   Cristal Ford, DO    ALLERGIES:  No Known Allergies  SOCIAL HISTORY:  Social History  Substance Use Topics  . Smoking status: Former Smoker    Packs/day: 0.50    Years: 18.00    Types: Cigarettes    Quit date: 04/29/2014  . Smokeless tobacco: Never Used  . Alcohol use No    FAMILY HISTORY: Family History  Problem Relation Age of Onset  . Hypertension Mother   . Asthma Daughter   . Cancer Paternal Aunt   . Asthma Maternal Grandmother     EXAM: BP (!) 144/81 (BP Location: Left Arm)   Pulse (!) 115   Temp 98.1 F (36.7 C) (Oral)   Resp (!) 26   SpO2 94%  CONSTITUTIONAL: Alert and oriented and  responds appropriately to questions. Obese, afebrile HEAD: Normocephalic EYES: Conjunctivae clear, pupils appear equal, EOMI ENT: normal nose; moist mucous membranes NECK: Supple, no meningismus, no nuchal rigidity, no LAD  CARD: regular and tachycardic; S1 and S2 appreciated; no murmurs, no clicks, no rubs, no gallops RESP: Normal chest excursion without splinting, patient is tachypneic, speaking short sentences, no hypoxia, diminished aeration at her bases bilaterally, diffuse expiratory and inspiratory wheezes, no rhonchi or rales ABD/GI: Normal bowel sounds; non-distended; soft, non-tender, no rebound, no guarding, no peritoneal signs, no hepatosplenomegaly BACK:  The back appears normal and is non-tender to palpation, there is no CVA tenderness EXT: Normal ROM in all joints; non-tender to  palpation; no edema; normal capillary refill; no cyanosis, no calf tenderness or swelling    SKIN: Normal color for age and race; warm; no rash NEURO: Moves all extremities equally PSYCH: The patient's mood and manner are appropriate. Grooming and personal hygiene are appropriate.  MEDICAL DECISION MAKING: Patient here with asthma exacerbation. Labs show chronic leukocytosis. She also has elevated lactate. I do not think this is sepsis. I think this is more likely secondary to her respiratory status. We'll give IV fluids, albuterol, Atrovent, Solu-Medrol, magnesium. Chest x-ray is clear. Doubt ACS, PE or dissection. She may need admission.  ED PROGRESS: 4:20 AM  Patient is still wheezing but has much better aeration. She has been able to ambulate to the bathroom but does still appear tachypneic and speaking short sentences after walking and with minimal movement in the bed but there is no hypoxia. I feel she will need admission for continued breathing treatments. She is comfortable this plan. Her primary doctor is with [facility] and wellness.   Lactate is improving with IV hydration and with improvement of  her respiratory status. Again I do not think this is sepsis.  4:48 AM Discussed patient's case with hospitalist, Dr. Blaine Hamper.  I have recommended admission and patient (and family if present) agree with this plan. Admitting physician will place admission orders.   I reviewed all nursing notes, vitals, pertinent previous records, EKGs, lab and urine results, imaging (as available).    EKG Interpretation  Date/Time:  Friday January 17 2017 01:04:41 EDT Ventricular Rate:  118 PR Interval:  122 QRS Duration: 86 QT Interval:  328 QTC Calculation: 459 R Axis:   28 Text Interpretation:  Sinus tachycardia Right atrial enlargement Possible Anterior infarct , age undetermined Abnormal ECG No significant change since last tracing other than rate is faster Confirmed by Pryor Curia [phone]) on 01/17/2017 2:16:17 AM        CRITICAL CARE Performed by: Nyra Jabs   Total critical care time: 40 minutes  Critical care time was exclusive of separately billable procedures and treating other patients.  Critical care was necessary to treat or prevent imminent or life-threatening deterioration.  Critical care was time spent personally by me on the following activities: development of treatment plan with patient and/or surrogate as well as nursing, discussions with consultants, evaluation of patient's response to treatment, examination of patient, obtaining history from patient or surrogate, ordering and performing treatments and interventions, ordering and review of laboratory studies, ordering and review of radiographic studies, pulse oximetry and re-evaluation of patient's condition.    Ward, Delice Bison, DO 01/17/17 [phone]

## 2017-01-17 NOTE — ED Notes (Signed)
Admitting paged,   Darcey Nora Room 10  lactic has increased to 4.   Carmelina Paddock RN

## 2017-01-17 NOTE — ED Notes (Signed)
Admitting at bedside 

## 2017-01-17 NOTE — Progress Notes (Signed)
Discharge instructions given to patient and patients daughter, all questions answered at this time.  Pt. Belongings at bedside with patient.  Pt. VSS with no s/s of distress noted.  Patient stable for discharge.

## 2017-01-17 NOTE — Progress Notes (Addendum)
RT called to room to assess new patient, upon arrival patient is in no apparent distress, chest expansion symmetrical, regular unlabored breathing but dyspnea with exertion. Per patient statement she takes albuterol, pulmicort, and dulera BID at home. RT will place patient on TID with a PRN due to coming in for asthma excaerbation. Patient is clear and diminished throughout with diminished expiratory wheezes in upper airways only. Patient is 100% on room air. RT will continue to monitor.

## 2017-01-17 NOTE — ED Notes (Signed)
Patient stats dropped to 83% while sleeping. Patient placed on 2L reports she wears a Cpap at home.

## 2017-01-20 ENCOUNTER — Telehealth: Payer: Self-pay

## 2017-01-20 NOTE — Telephone Encounter (Signed)
[facility] Post-discharge Follow-Up Phone Call:  Date of Discharge:  01/17/17 Principal Discharge Diagnosis(es):  Asthma exacerbation Post-discharge Communication: (Clearly document all attempts clearly and date contact made) call placed to the patient Call Completed: Yes                    With Whom: patient Interpreter Needed: no     Please check all that apply:  X  Patient is knowledgeable of his/her condition(s) and/or treatment. - she stated that she feels " okay" but does not think she is " better."   She said she thinks that she was discharged because they thought she was breathing okay and she left the hospital unit with her daughter. Encouraged her to take her medications as ordered and to return to the ED if she develops respiratory distress. She noted that she is aware of when to return to the ED.  ? Patient is caring for self at home.  X  Patient is receiving assist at home from family and/or caregiver. Family and/or caregiver is knowledgeable of patient's condition(s) and/or treatment.- she receives support from her family.  ? Patient is receiving home health services. If so, name of agency.     Medication Reconciliation:  ? Medication list reviewed with patient. X  Patient obtained all discharge medications. If not, why? - she stated that she has all of her medications and is taking them as prescribed. She did not report any need to review the entire medication list but the changes were reviewed: Start taking prednisone 40 mg once daily with breakfast. Changes were made to the lasix, dulera and aldactone and she verbalized agreement with the changes.  She also confirmed that she is to stop taking meclizine and metformin.  She said that she does not have any tylenol #3.  She said that she has been using the pulmicort twice a day and the albuterol nebulizer 4-5 times a day.  She stated that she will need a refill of  the proair HFA soon.  Informed her that this CM  will check with the [facility] tomorrow regarding availability for refill. She thinks it may be too early for a refill.   She stated that overall she feels weak, especially her legs and feet that " hurt" at times.    Activities of Daily Living:  ? Independent X  Needs assist (describe; ? home DME used) - PCS as needed. Activity limited by shortness of breath,  ? Total Care (describe, ? home DME used)   Community resources in place for patient:  X   she receives 80 hours/month of PCS.      A referral has also been made to [facility].      [employer] application has been completed. She is waiting            for a call to schedule an assessment.  ? Home Health/Home DME ? Assisted Living ? Support Group   She receives $194 food stamps and is waiting for a decision about her disability application.         Questions/Concerns discussed:  An appointment was scheduled for 01/29/17 @ 1400.  She also noted that she needs to call pulmonary tomorrow as she has been scheduled for an appointment for xolair,   No further questions reported at this time

## 2017-01-21 ENCOUNTER — Telehealth: Payer: Self-pay

## 2017-01-21 NOTE — Telephone Encounter (Signed)
Call placed to the patient # [phone]  to inform her that her prescription for ProAir will be ready for pick up at [facility] tomorrow - 01/22/17. Voicemail message left requesting a call back to # [phone]

## 2017-01-22 ENCOUNTER — Emergency Department (HOSPITAL_COMMUNITY): Payer: Medicaid Other

## 2017-01-22 ENCOUNTER — Encounter (HOSPITAL_COMMUNITY): Payer: Self-pay | Admitting: Emergency Medicine

## 2017-01-22 DIAGNOSIS — J441 Chronic obstructive pulmonary disease with (acute) exacerbation: Secondary | ICD-10-CM | POA: Diagnosis not present

## 2017-01-22 DIAGNOSIS — R7303 Prediabetes: Secondary | ICD-10-CM | POA: Diagnosis not present

## 2017-01-22 DIAGNOSIS — I110 Hypertensive heart disease with heart failure: Secondary | ICD-10-CM | POA: Insufficient documentation

## 2017-01-22 DIAGNOSIS — Z79899 Other long term (current) drug therapy: Secondary | ICD-10-CM | POA: Diagnosis not present

## 2017-01-22 DIAGNOSIS — I5032 Chronic diastolic (congestive) heart failure: Secondary | ICD-10-CM | POA: Diagnosis not present

## 2017-01-22 DIAGNOSIS — J45901 Unspecified asthma with (acute) exacerbation: Secondary | ICD-10-CM | POA: Insufficient documentation

## 2017-01-22 DIAGNOSIS — R062 Wheezing: Secondary | ICD-10-CM | POA: Diagnosis present

## 2017-01-22 DIAGNOSIS — Z87891 Personal history of nicotine dependence: Secondary | ICD-10-CM | POA: Diagnosis not present

## 2017-01-22 LAB — BASIC METABOLIC PANEL
Anion gap: 6 (ref 5–15)
CHLORIDE: 101 mmol/L (ref 101–111)
CO2: 28 mmol/L (ref 22–32)
CREATININE: 0.86 mg/dL (ref 0.44–1.00)
Calcium: 8.1 mg/dL — ABNORMAL LOW (ref 8.9–10.3)
GFR calc non Af Amer: >60 mL/min (ref >60)
Glucose, Bld: 107 mg/dL — ABNORMAL HIGH (ref 65–99)
Potassium: 3.1 mmol/L — ABNORMAL LOW (ref 3.5–5.1)
Sodium: 135 mmol/L (ref 135–145)

## 2017-01-22 LAB — CULTURE, BLOOD (ROUTINE X 2)
CULTURE: NO GROWTH
CULTURE: NO GROWTH
SPECIAL REQUESTS: ADEQUATE
Special Requests: ADEQUATE

## 2017-01-22 LAB — CBC
HEMATOCRIT: 39.3 % (ref 36.0–46.0)
HEMOGLOBIN: 12.3 g/dL (ref 12.0–15.0)
MCH: 27.7 pg (ref 26.0–34.0)
MCHC: 31.3 g/dL (ref 30.0–36.0)
MCV: 88.5 fL (ref 78.0–100.0)
Platelets: 306 10*3/uL (ref 150–400)
RBC: 4.44 MIL/uL (ref 3.87–5.11)
RDW: 20.1 % — ABNORMAL HIGH (ref 11.5–15.5)
WBC: 16.9 10*3/uL — ABNORMAL HIGH (ref 4.0–10.5)

## 2017-01-22 MED ORDER — ALBUTEROL SULFATE (2.5 MG/3ML) 0.083% IN NEBU
5.0000 mg | INHALATION_SOLUTION | Freq: Once | RESPIRATORY_TRACT | Status: AC
  Administered 2017-01-22: 5 mg via RESPIRATORY_TRACT

## 2017-01-22 MED ORDER — ALBUTEROL SULFATE (2.5 MG/3ML) 0.083% IN NEBU
INHALATION_SOLUTION | RESPIRATORY_TRACT | Status: AC
  Administered 2017-01-22: 5 mg via RESPIRATORY_TRACT
  Filled 2017-01-22: qty 6

## 2017-01-22 NOTE — ED Triage Notes (Signed)
Pt reports SOB that never resolved since being DC form hospital last week. Pt seen here multiples times for same. Hx of asthma and COPD. Moderate exp wheezing noted with labored breathing. Denies CP.

## 2017-01-23 ENCOUNTER — Telehealth: Payer: Self-pay | Admitting: Family Medicine

## 2017-01-23 ENCOUNTER — Ambulatory Visit: Payer: Medicaid Other

## 2017-01-23 ENCOUNTER — Telehealth: Payer: Self-pay | Admitting: Internal Medicine

## 2017-01-23 ENCOUNTER — Emergency Department (HOSPITAL_COMMUNITY)
Admission: EM | Admit: 2017-01-23 | Discharge: 2017-01-23 | Disposition: A | Discharge status: Home / Self Care | Payer: Medicaid Other | Attending: Emergency Medicine | Admitting: Emergency Medicine

## 2017-01-23 ENCOUNTER — Ambulatory Visit (INDEPENDENT_AMBULATORY_CARE_PROVIDER_SITE_OTHER): Payer: Medicaid Other

## 2017-01-23 DIAGNOSIS — J4550 Severe persistent asthma, uncomplicated: Secondary | ICD-10-CM | POA: Diagnosis not present

## 2017-01-23 DIAGNOSIS — J45901 Unspecified asthma with (acute) exacerbation: Secondary | ICD-10-CM

## 2017-01-23 MED ORDER — PREDNISONE 20 MG PO TABS: 40.0000 mg | ORAL_TABLET | Freq: Every day | ORAL | 0 refills | Status: DC

## 2017-01-23 MED ORDER — METHYLPREDNISOLONE SODIUM SUCC 125 MG IJ SOLR
125.0000 mg | Freq: Once | INTRAMUSCULAR | Status: AC
  Administered 2017-01-23: 125 mg via INTRAVENOUS
  Filled 2017-01-23: qty 2

## 2017-01-23 MED ORDER — PREDNISONE 20 MG PO TABS: 60.0000 mg | ORAL_TABLET | Freq: Once | ORAL | Status: DC

## 2017-01-23 MED ORDER — IPRATROPIUM-ALBUTEROL 0.5-2.5 (3) MG/3ML IN SOLN
3.0000 mL | Freq: Once | RESPIRATORY_TRACT | Status: AC
  Administered 2017-01-23: 3 mL via RESPIRATORY_TRACT
  Filled 2017-01-23: qty 3

## 2017-01-23 MED ORDER — ALBUTEROL SULFATE HFA 108 (90 BASE) MCG/ACT IN AERS
2.0000 | INHALATION_SPRAY | RESPIRATORY_TRACT | Status: DC | PRN
  Administered 2017-01-23: 2 via RESPIRATORY_TRACT
  Filled 2017-01-23: qty 6.7

## 2017-01-23 MED ORDER — ALBUTEROL (5 MG/ML) CONTINUOUS INHALATION SOLN
10.0000 mg/h | INHALATION_SOLUTION | RESPIRATORY_TRACT | Status: AC
  Administered 2017-01-23: 10 mg/h via RESPIRATORY_TRACT
  Filled 2017-01-23: qty 20

## 2017-01-23 MED ORDER — ALBUTEROL SULFATE HFA 108 (90 BASE) MCG/ACT IN AERS: 2.0000 | INHALATION_SPRAY | RESPIRATORY_TRACT | 3 refills | Status: DC | PRN

## 2017-01-23 MED ORDER — AZITHROMYCIN 250 MG PO TABS: 250.0000 mg | ORAL_TABLET | Freq: Every day | ORAL | 0 refills | Status: DC

## 2017-01-23 NOTE — Telephone Encounter (Signed)
Called [employer] back and asked the rep I spoke to, to let [place] know I called April [name] to make her aware her PAN form had exp..  I'm waiting for the pt. to call me back. I created another note so I can close this one. Nothing further needed.

## 2017-01-23 NOTE — ED Notes (Signed)
While ambulating pt O2 98% with hr between 120-140. Stated she felt dizzy and weak.

## 2017-01-23 NOTE — Telephone Encounter (Signed)
I called pt. Because after ordering her xolair for next month, I noticed her ins. Was going to exp. 01/26/2017. I spoke with Sharyn Lull at [facility] (before I realized her ins. Was going to exp.) and she placed a courtesy order but called back and said she couldn't because pt's PAN form exp.Everette Rank call Ms. Krasinski back to see if I can reach her this time. I left her another message. I will wait to hear from her tomorrow.

## 2017-01-23 NOTE — Telephone Encounter (Signed)
Sharyn Lull with [facility] calling (cb #: [phone]) states that they are unable to send shipment d/t PAN form being expired.  TS please advise.  Thanks!

## 2017-01-23 NOTE — ED Provider Notes (Signed)
[facility] DEPT Provider Note   CSN: [id number] Arrival date & time: 01/22/17  1813     History   Chief Complaint Chief Complaint  Patient presents with  . Asthma    HPI April [name] is a 46 y.o. female.  Patient presents to the emergency department with chief complaint of wheezing. She reports that his significant asthma history. She states that she has multiple exacerbations, and is seen by Dr. Annamaria Boots from pulmonology. She was recently admitted to the hospital for asthma exacerbation. She has been intubated in the past. She states that her asthma symptoms worsened over the past several days. She has been using her regular home inhalers, but has not been taking any prednisone. She states that should prefer not to take this. She denies any fevers or chills. She states that she has a dry nonproductive cough. She denies any other associated symptoms. There are no modifying factors. She states this feels like her typical asthma exacerbation.   The history is provided by the patient. No language interpreter was used.    Past Medical History:  Diagnosis Date  . Acanthosis nigricans   . Arthritis   . Asthma    Followed by Dr. Annamaria Boots (pulmonology); receives every other week omalizumab injections; has frequent exacerbations  . COPD (chronic obstructive pulmonary disease) ([place])    PFTs in 2002, FEV1/FVC 65, no post bronchodilater test done  . Depression   . GERD (gastroesophageal reflux disease)   . Headache(784.0)   . Helicobacter pylori (H. pylori) infection   . Hypertension, essential   . Insomnia   . Menorrhagia   . Morbid obesity ([place])   . Obesity   . Seasonal allergies   . Shortness of breath   . Sleep apnea    Sleep study 2008 - mild OSA, not enough events to titrate CPAP  . Tobacco user     Patient Active Problem List   Diagnosis Date Noted  . Chronic diastolic CHF (congestive heart failure) ([place]) 01/17/2017  . SIRS (systemic inflammatory response syndrome) ([place])  01/17/2017  . Depression 01/17/2017  . OSA (obstructive sleep apnea) 01/17/2017  . Asthma exacerbation 01/17/2017  . SOB (shortness of breath) on exertion   . Asthma exacerbation in COPD ([place]) 12/10/2016  . Moderate persistent asthma with exacerbation 10/19/2016  . Diarrhea 10/19/2016  . Normocytic anemia 10/02/2016  . Respiratory failure ([place]) 10/02/2016  . Hypertensive urgency, malignant 06/22/2016  . Pulmonary edema 06/22/2016  . Hypertensive urgency 06/22/2016  . Malignant hypertension due to primary aldosteronism ([place]) 05/05/2016  . Lip laceration 05/05/2016  . Elevated hemoglobin A1c 05/05/2016  . GERD (gastroesophageal reflux disease) 08/30/2015  . Anxiety and depression 08/30/2015  . Prediabetes 08/22/2015  . Seasonal allergic rhinitis 08/29/2013  . Leukocytosis 10/21/2012  . Tobacco abuse 10/07/2012  . Asthma, chronic obstructive, without status asthmaticus ([place]) 05/07/2012  . Knee pain, bilateral 04/25/2011  . Primary insomnia 03/14/2011  . Mild obstructive sleep apnea 12/19/2010  . Hypokalemia 08/13/2010  . Cervical back pain with evidence of disc disease 04/08/2008  . Essential hypertension 07/31/2006  . Morbid obesity with body mass index of 50.0-59.9 in adult ([place]) 06/17/2006  . Major depressive disorder, recurrent episode ([place]) 04/10/2006    Past Surgical History:  Procedure Laterality Date  . BREAST REDUCTION SURGERY  09/2011  . TUBAL LIGATION  1996   bilateral    OB History    No data available       Home Medications    Prior to Admission  medications   Medication Sig Start Date End Date Taking? Authorizing Provider  albuterol (PROAIR HFA) 108 (90 Base) MCG/ACT inhaler Inhale 2 puffs into the lungs every 6 (six) hours as needed for wheezing or shortness of breath. 12/13/16   Rai, Ripudeep K, MD  albuterol (PROVENTIL) (2.5 MG/3ML) 0.083% nebulizer solution Take 3 mLs (2.5 mg total) by nebulization every 4 (four) hours as needed for wheezing or  shortness of breath. 01/15/17   Arnoldo Morale, MD  amLODipine (NORVASC) 10 MG tablet Take 1 tablet (10 mg total) by mouth daily. 12/13/16   Rai, Ripudeep Raliegh Ip, MD  arformoterol (BROVANA) 15 MCG/2ML NEBU Take 2 mLs (15 mcg total) by nebulization 2 (two) times daily. 01/02/17   Doreatha Lew, MD  benzonatate (TESSALON) 200 MG capsule Take 1 capsule (200 mg total) by mouth 3 (three) times daily as needed for cough. Patient not taking: Reported on 01/17/2017 12/13/16   Rai, Ripudeep K, MD  budesonide (PULMICORT) 0.25 MG/2ML nebulizer solution Take 2 mLs (0.25 mg total) by nebulization 2 (two) times daily. 01/02/17   Patrecia Pour, Christean Grief, MD  butalbital-acetaminophen-caffeine (FIORICET, ESGIC) [phone] MG tablet Take 1-2 tablets by mouth every 6 (six) hours as needed for headache. 12/19/16 12/19/17  Joy, Shawn C, PA-C  fluticasone (FLONASE) 50 MCG/ACT nasal spray Place 2 sprays into both nostrils daily. 12/13/16   Rai, Vernelle Emerald, MD  furosemide (LASIX) 40 MG tablet Take 1 tablet (40 mg total) by mouth daily. 01/17/17 01/17/18  Janece Canterbury, MD  guaiFENesin (MUCINEX) 600 MG 12 hr tablet Take 1 tablet (600 mg total) by mouth 2 (two) times daily. 12/13/16   Rai, Vernelle Emerald, MD  loratadine (CLARITIN) 10 MG tablet Take 1 tablet (10 mg total) by mouth daily. 08/30/15   [name], MD  losartan (COZAAR) 100 MG tablet Take 1 tablet (100 mg total) by mouth daily. 12/13/16   Rai, Vernelle Emerald, MD  metoprolol tartrate (LOPRESSOR) 25 MG tablet Take 1 tablet (25 mg total) by mouth 2 (two) times daily. 12/27/16   Arnoldo Morale, MD  mometasone-formoterol (DULERA) 200-5 MCG/ACT AERO Inhale 2 puffs into the lungs 2 (two) times daily. 01/06/17   Baird Lyons D, MD  montelukast (SINGULAIR) 10 MG tablet Take 1 tablet (10 mg total) by mouth at bedtime. 01/02/17   Patrecia Pour, Christean Grief, MD  nicotine (NICODERM CQ - DOSED IN MG/24 HOURS) 14 mg/24hr patch Place 1 patch (14 mg total) onto the skin daily. Patient taking differently: Place 14 mg  onto the skin daily as needed (smoking cessation).  10/04/16   Reyne Dumas, MD  omeprazole (PRILOSEC) 20 MG capsule Take 1 capsule (20 mg total) by mouth daily. Patient taking differently: Take 20 mg by mouth 2 (two) times daily.  10/09/16   Baird Lyons D, MD  sertraline (ZOLOFT) 100 MG tablet Take 1 tablet (100 mg total) by mouth daily. 12/27/16   Arnoldo Morale, MD  spironolactone (ALDACTONE) 100 MG tablet Take 1 tablet (100 mg total) by mouth daily. 01/17/17   Janece Canterbury, MD  zolpidem (AMBIEN) 5 MG tablet Take 1 tablet (5 mg total) by mouth at bedtime as needed for sleep. 12/22/16   Cristal Ford, DO    Family History Family History  Problem Relation Age of Onset  . Hypertension Mother   . Asthma Daughter   . Cancer Paternal Aunt   . Asthma Maternal Grandmother     Social History Social History  Substance Use Topics  . Smoking status: Former Smoker  Packs/day: 0.50    Years: 18.00    Types: Cigarettes    Quit date: 04/29/2014  . Smokeless tobacco: Never Used  . Alcohol use No     Allergies   Patient has no known allergies.   Review of Systems Review of Systems  All other systems reviewed and are negative.    Physical Exam Updated Vital Signs BP 113/75 (BP Location: Left Arm)   Pulse 82   Temp 98.7 F (37.1 C) (Oral)   Resp 16   Ht 5\' 6"  (1.676 m)   Wt (!) 145.2 kg (320 lb)   SpO2 100%   BMI 51.65 kg/m   Physical Exam  Constitutional: She is oriented to person, place, and time. She appears well-developed and well-nourished.  HENT:  Head: Normocephalic and atraumatic.  Eyes: Pupils are equal, round, and reactive to light. Conjunctivae and EOM are normal.  Neck: Normal range of motion. Neck supple.  Cardiovascular: Normal rate and regular rhythm.  Exam reveals no gallop and no friction rub.   No murmur heard. Pulmonary/Chest: Effort normal. No respiratory distress. She has wheezes. She has no rales. She exhibits no tenderness.  End expiratory  wheezes  Abdominal: Soft. Bowel sounds are normal. She exhibits no distension and no mass. There is no tenderness. There is no rebound and no guarding.  Musculoskeletal: Normal range of motion. She exhibits no edema or tenderness.  Neurological: She is alert and oriented to person, place, and time.  Skin: Skin is warm and dry.  Psychiatric: She has a normal mood and affect. Her behavior is normal. Judgment and thought content normal.  Nursing note and vitals reviewed.    ED Treatments / Results  Labs (all labs ordered are listed, but only abnormal results are displayed) Labs Reviewed  CBC - Abnormal; Notable for the following:       Result Value   WBC 16.9 (*)    RDW 20.1 (*)    All other components within normal limits  BASIC METABOLIC PANEL - Abnormal; Notable for the following:    Potassium 3.1 (*)    Glucose, Bld 107 (*)    BUN <5 (*)    Calcium 8.1 (*)    All other components within normal limits    EKG  EKG Interpretation None       Radiology Dg Chest 2 View  Result Date: 01/22/2017 CLINICAL DATA:  Shortness of breath for week. No chest pain. History of hypertension, on medication. EXAM: CHEST  2 VIEW COMPARISON:  Chest radiograph January 17, 2017 FINDINGS: Cardiomediastinal silhouette is normal. No pleural effusions or focal consolidations. Trachea projects midline and there is no pneumothorax. Soft tissue planes and included osseous structures are non-suspicious. Large body habitus. IMPRESSION: Stable examination:  No acute cardiopulmonary process. Electronically Signed   By: Elon Alas M.D.   On: 01/22/2017 20:48    Procedures Procedures (including critical care time)  Medications Ordered in ED Medications  ipratropium-albuterol (DUONEB) 0.5-2.5 (3) MG/3ML nebulizer solution 3 mL (not administered)  predniSONE (DELTASONE) tablet 60 mg (not administered)  albuterol (PROVENTIL) (2.5 MG/3ML) 0.083% nebulizer solution 5 mg (5 mg Nebulization Given 01/22/17  2024)     Initial Impression / Assessment and Plan / ED Course  I have reviewed the triage vital signs and the nursing notes.  Pertinent labs & imaging results that were available during my care of the patient were reviewed by me and considered in my medical decision making (see chart for details).  Patient with diminished lung sounds and end expiratory wheezes, will give breathing treatment and Solu-Medrol. Will reassess.  Patient given several breathing treatments in the emergency department including a continuous albuterol treatment with good improvement. She ambulates maintaining greater than 95% pulse oxygenation. Her lung sounds have improved on my exam. She maintains greater than 90% pulse oxygenation even while sleeping. I believe her to be stable for discharge with outpatient follow-up with pulmonology. She is not having chest pain. Doubt PE or ACS. She was tachycardic when she ambulated, but I attribute this to the continuous albuterol treatment.  Her tachycardia has significantly improved after additional monitoring.  Final Clinical Impressions(s) / ED Diagnoses   Final diagnoses:  Exacerbation of asthma, unspecified asthma severity, unspecified whether persistent    New Prescriptions New Prescriptions   ALBUTEROL (PROVENTIL HFA;VENTOLIN HFA) 108 (90 BASE) MCG/ACT INHALER    Inhale 2 puffs into the lungs every 4 (four) hours as needed for wheezing or shortness of breath.   AZITHROMYCIN (ZITHROMAX) 250 MG TABLET    Take 1 tablet (250 mg total) by mouth daily. Take first 2 tablets together, then 1 every day until finished.   PREDNISONE (DELTASONE) 20 MG TABLET    Take 2 tablets (40 mg total) by mouth daily.     Montine Circle, PA-C 01/23/17 0456    Ripley Fraise, MD 01/23/17 [phone]

## 2017-01-23 NOTE — Telephone Encounter (Signed)
Call placed to patient [phone], in regards to her medication and to check on how she is doing. No answer. Left patient a message to return my call at [phone]. Patient can speak with Opal Sidles or I.

## 2017-01-23 NOTE — ED Provider Notes (Signed)
ED ECG REPORT   Date: 01/22/2017 2026  Rate: 78  Rhythm: normal sinus rhythm  QRS Axis: normal  Intervals: normal  ST/T Wave abnormalities: normal  Conduction Disutrbances:none  Narrative Interpretation: PVC noted   I have personally reviewed the EKG tracing and agree with the computerized printout as noted.    Ripley Fraise, MD 01/23/17 [phone]

## 2017-01-23 NOTE — Telephone Encounter (Addendum)
#   vials:4 Ordered date:01/23/17 Shipping Date:01/28/17  April [name] said she was going to send a courtesy ship. While she sends in that her insurance has changed. April [name] didn't say how many vials of xolair she is sending. Since it's a courtesy ship. It may be just 2.

## 2017-01-24 MED ORDER — OMALIZUMAB 150 MG ~~LOC~~ SOLR
225.0000 mg | Freq: Once | SUBCUTANEOUS | Status: AC
  Administered 2017-01-23: 225 mg via SUBCUTANEOUS

## 2017-01-26 ENCOUNTER — Inpatient Hospital Stay (HOSPITAL_COMMUNITY)
Admission: EM | Admit: 2017-01-26 | Discharge: 2017-02-01 | DRG: 202 | Disposition: A | Discharge status: Home / Self Care | Payer: Medicaid Other | Attending: Family Medicine | Admitting: Family Medicine

## 2017-01-26 ENCOUNTER — Emergency Department (HOSPITAL_COMMUNITY): Payer: Medicaid Other

## 2017-01-26 ENCOUNTER — Encounter (HOSPITAL_COMMUNITY): Payer: Self-pay

## 2017-01-26 ENCOUNTER — Other Ambulatory Visit: Payer: Self-pay

## 2017-01-26 DIAGNOSIS — E876 Hypokalemia: Secondary | ICD-10-CM | POA: Diagnosis present

## 2017-01-26 DIAGNOSIS — T380X5A Adverse effect of glucocorticoids and synthetic analogues, initial encounter: Secondary | ICD-10-CM | POA: Diagnosis present

## 2017-01-26 DIAGNOSIS — Z825 Family history of asthma and other chronic lower respiratory diseases: Secondary | ICD-10-CM

## 2017-01-26 DIAGNOSIS — J302 Other seasonal allergic rhinitis: Secondary | ICD-10-CM | POA: Diagnosis present

## 2017-01-26 DIAGNOSIS — Z809 Family history of malignant neoplasm, unspecified: Secondary | ICD-10-CM

## 2017-01-26 DIAGNOSIS — T486X6A Underdosing of antiasthmatics, initial encounter: Secondary | ICD-10-CM | POA: Diagnosis present

## 2017-01-26 DIAGNOSIS — I5032 Chronic diastolic (congestive) heart failure: Secondary | ICD-10-CM | POA: Diagnosis present

## 2017-01-26 DIAGNOSIS — Z9119 Patient's noncompliance with other medical treatment and regimen: Secondary | ICD-10-CM

## 2017-01-26 DIAGNOSIS — D72828 Other elevated white blood cell count: Secondary | ICD-10-CM | POA: Diagnosis present

## 2017-01-26 DIAGNOSIS — J4551 Severe persistent asthma with (acute) exacerbation: Principal | ICD-10-CM | POA: Diagnosis present

## 2017-01-26 DIAGNOSIS — J45901 Unspecified asthma with (acute) exacerbation: Secondary | ICD-10-CM | POA: Diagnosis present

## 2017-01-26 DIAGNOSIS — Z91128 Patient's intentional underdosing of medication regimen for other reason: Secondary | ICD-10-CM

## 2017-01-26 DIAGNOSIS — R0602 Shortness of breath: Secondary | ICD-10-CM

## 2017-01-26 DIAGNOSIS — Z87891 Personal history of nicotine dependence: Secondary | ICD-10-CM

## 2017-01-26 DIAGNOSIS — K219 Gastro-esophageal reflux disease without esophagitis: Secondary | ICD-10-CM | POA: Diagnosis present

## 2017-01-26 DIAGNOSIS — F339 Major depressive disorder, recurrent, unspecified: Secondary | ICD-10-CM | POA: Diagnosis present

## 2017-01-26 DIAGNOSIS — Z8249 Family history of ischemic heart disease and other diseases of the circulatory system: Secondary | ICD-10-CM

## 2017-01-26 DIAGNOSIS — Z79899 Other long term (current) drug therapy: Secondary | ICD-10-CM

## 2017-01-26 DIAGNOSIS — Z6841 Body Mass Index (BMI) 40.0 and over, adult: Secondary | ICD-10-CM

## 2017-01-26 DIAGNOSIS — Z9851 Tubal ligation status: Secondary | ICD-10-CM

## 2017-01-26 DIAGNOSIS — G4733 Obstructive sleep apnea (adult) (pediatric): Secondary | ICD-10-CM | POA: Diagnosis present

## 2017-01-26 DIAGNOSIS — Z7951 Long term (current) use of inhaled steroids: Secondary | ICD-10-CM

## 2017-01-26 DIAGNOSIS — Z8619 Personal history of other infectious and parasitic diseases: Secondary | ICD-10-CM

## 2017-01-26 DIAGNOSIS — R000 Tachycardia, unspecified: Secondary | ICD-10-CM | POA: Diagnosis present

## 2017-01-26 DIAGNOSIS — R51 Headache: Secondary | ICD-10-CM | POA: Diagnosis present

## 2017-01-26 DIAGNOSIS — I110 Hypertensive heart disease with heart failure: Secondary | ICD-10-CM | POA: Diagnosis present

## 2017-01-26 DIAGNOSIS — J449 Chronic obstructive pulmonary disease, unspecified: Secondary | ICD-10-CM | POA: Diagnosis present

## 2017-01-26 DIAGNOSIS — D72829 Elevated white blood cell count, unspecified: Secondary | ICD-10-CM | POA: Diagnosis present

## 2017-01-26 DIAGNOSIS — F5101 Primary insomnia: Secondary | ICD-10-CM | POA: Diagnosis present

## 2017-01-26 DIAGNOSIS — Z9989 Dependence on other enabling machines and devices: Secondary | ICD-10-CM

## 2017-01-26 LAB — CBC
HEMATOCRIT: 39 % (ref 36.0–46.0)
Hemoglobin: 12.4 g/dL (ref 12.0–15.0)
MCH: 28.2 pg (ref 26.0–34.0)
MCHC: 31.8 g/dL (ref 30.0–36.0)
MCV: 88.6 fL (ref 78.0–100.0)
PLATELETS: 245 10*3/uL (ref 150–400)
RBC: 4.4 MIL/uL (ref 3.87–5.11)
RDW: 19.9 % — ABNORMAL HIGH (ref 11.5–15.5)
WBC: 17.4 10*3/uL — AB (ref 4.0–10.5)

## 2017-01-26 LAB — BASIC METABOLIC PANEL
ANION GAP: 7 (ref 5–15)
BUN: <5 mg/dL — ABNORMAL LOW (ref 6–20)
CHLORIDE: 102 mmol/L (ref 101–111)
CO2: 25 mmol/L (ref 22–32)
Calcium: 7.7 mg/dL — ABNORMAL LOW (ref 8.9–10.3)
Creatinine, Ser: 0.65 mg/dL (ref 0.44–1.00)
GFR calc non Af Amer: >60 mL/min (ref >60)
GLUCOSE: 120 mg/dL — AB (ref 65–99)
Potassium: 2.9 mmol/L — ABNORMAL LOW (ref 3.5–5.1)
Sodium: 134 mmol/L — ABNORMAL LOW (ref 135–145)

## 2017-01-26 LAB — CBG MONITORING, ED: Glucose-Capillary: 100 mg/dL — ABNORMAL HIGH (ref 65–99)

## 2017-01-26 LAB — D-DIMER, QUANTITATIVE: D-Dimer, Quant: 0.93 ug/mL-FEU — ABNORMAL HIGH (ref 0.00–0.50)

## 2017-01-26 LAB — MAGNESIUM: Magnesium: 1.7 mg/dL (ref 1.7–2.4)

## 2017-01-26 MED ORDER — METHYLPREDNISOLONE SODIUM SUCC 125 MG IJ SOLR
125.0000 mg | Freq: Once | INTRAMUSCULAR | Status: AC
  Administered 2017-01-26: 125 mg via INTRAVENOUS
  Filled 2017-01-26: qty 2

## 2017-01-26 MED ORDER — IPRATROPIUM BROMIDE 0.02 % IN SOLN
0.5000 mg | Freq: Once | RESPIRATORY_TRACT | Status: AC
  Administered 2017-01-26: 0.5 mg via RESPIRATORY_TRACT
  Filled 2017-01-26: qty 2.5

## 2017-01-26 MED ORDER — MAGNESIUM SULFATE 2 GM/50ML IV SOLN
2.0000 g | Freq: Once | INTRAVENOUS | Status: AC
  Administered 2017-01-26: 2 g via INTRAVENOUS
  Filled 2017-01-26: qty 50

## 2017-01-26 MED ORDER — ALBUTEROL (5 MG/ML) CONTINUOUS INHALATION SOLN
10.0000 mg/h | INHALATION_SOLUTION | Freq: Once | RESPIRATORY_TRACT | Status: AC
  Administered 2017-01-26: 10 mg/h via RESPIRATORY_TRACT
  Filled 2017-01-26: qty 20

## 2017-01-26 MED ORDER — METHYLPREDNISOLONE SODIUM SUCC 125 MG IJ SOLR: 60.0000 mg | Freq: Once | INTRAMUSCULAR | Status: DC

## 2017-01-26 MED ORDER — SODIUM CHLORIDE 0.9 % IV BOLUS (SEPSIS)
500.0000 mL | Freq: Once | INTRAVENOUS | Status: AC
  Administered 2017-01-26: 500 mL via INTRAVENOUS

## 2017-01-26 MED ORDER — ALBUTEROL SULFATE (2.5 MG/3ML) 0.083% IN NEBU
INHALATION_SOLUTION | RESPIRATORY_TRACT | Status: AC
  Filled 2017-01-26: qty 6

## 2017-01-26 MED ORDER — ALBUTEROL SULFATE (2.5 MG/3ML) 0.083% IN NEBU
5.0000 mg | INHALATION_SOLUTION | Freq: Once | RESPIRATORY_TRACT | Status: AC
  Administered 2017-01-26: 5 mg via RESPIRATORY_TRACT

## 2017-01-26 MED ORDER — ACETAMINOPHEN 325 MG PO TABS
650.0000 mg | ORAL_TABLET | Freq: Once | ORAL | Status: AC
  Administered 2017-01-26: 650 mg via ORAL
  Filled 2017-01-26: qty 2

## 2017-01-26 NOTE — ED Provider Notes (Signed)
[facility] DEPT Provider Note   CSN: [id number] Arrival date & time: 01/26/17  1544     History   Chief Complaint Chief Complaint  Patient presents with  . Asthma    HPI April [name] is a 46 y.o. female.  HPI  Patient is a 46 year old female with a history of asthma, OSA, hypertension, allergies, and morbid obesity presenting for worsening shortness of breath. Patient reports that she has had multiple exacerbations in the month of September that she cannot identify a trigger for. This current episode of dyspnea began yesterday and patient noted a new symptom of a sore throat. Patient reports she is having a cough nonproductive of blood or sputum. Patient denies any fever or chills, or chest pain. Patient reports she is frequently dizzy when she goes to stand up but has not noticed any increase in the symptoms. Patient reports she has some chronic right-sided calf pain with some swelling that she noticed slightly more on the right that has worsened. When patient was evaluated on 01-23-2017, patient was given prescription for prednisone and azithromycin. Patient reports she has been completing both of those courses as prescribed, with no improvement. Patient reports she has all inhalers at home including albuterol, Dulera, and all allergy regimens. Patient receives biweekly monoclonal antibodies for allergies.   Patient with multiple hospitalizations for asthma including 2 within the last 2 months. Most recently, patient was well-controlled on finishing ED course and was ultimately discharged from the floor. Patient denies any history of ever requiring intubation due to asthma attack.  Past Medical History:  Diagnosis Date  . Acanthosis nigricans   . Arthritis   . Asthma    Followed by Dr. Annamaria Boots (pulmonology); receives every other week omalizumab injections; has frequent exacerbations  . COPD (chronic obstructive pulmonary disease) ([place])    PFTs in 2002, FEV1/FVC 65, no post  bronchodilater test done  . Depression   . GERD (gastroesophageal reflux disease)   . Headache(784.0)   . Helicobacter pylori (H. pylori) infection   . Hypertension, essential   . Insomnia   . Menorrhagia   . Morbid obesity ([place])   . Obesity   . Seasonal allergies   . Shortness of breath   . Sleep apnea    Sleep study 2008 - mild OSA, not enough events to titrate CPAP  . Tobacco user     Patient Active Problem List   Diagnosis Date Noted  . Chronic diastolic CHF (congestive heart failure) ([place]) 01/17/2017  . SIRS (systemic inflammatory response syndrome) ([place]) 01/17/2017  . Depression 01/17/2017  . OSA (obstructive sleep apnea) 01/17/2017  . Asthma exacerbation 01/17/2017  . SOB (shortness of breath) on exertion   . Asthma exacerbation in COPD ([place]) 12/10/2016  . Moderate persistent asthma with exacerbation 10/19/2016  . Diarrhea 10/19/2016  . Normocytic anemia 10/02/2016  . Respiratory failure ([place]) 10/02/2016  . Hypertensive urgency, malignant 06/22/2016  . Pulmonary edema 06/22/2016  . Hypertensive urgency 06/22/2016  . Malignant hypertension due to primary aldosteronism ([place]) 05/05/2016  . Lip laceration 05/05/2016  . Elevated hemoglobin A1c 05/05/2016  . GERD (gastroesophageal reflux disease) 08/30/2015  . Anxiety and depression 08/30/2015  . Prediabetes 08/22/2015  . Seasonal allergic rhinitis 08/29/2013  . Leukocytosis 10/21/2012  . Tobacco abuse 10/07/2012  . Asthma, chronic obstructive, without status asthmaticus ([place]) 05/07/2012  . Knee pain, bilateral 04/25/2011  . Primary insomnia 03/14/2011  . Mild obstructive sleep apnea 12/19/2010  . Hypokalemia 08/13/2010  . Cervical back pain with evidence  of disc disease 04/08/2008  . Essential hypertension 07/31/2006  . Morbid obesity with body mass index of 50.0-59.9 in adult ([place]) 06/17/2006  . Major depressive disorder, recurrent episode ([place]) 04/10/2006    Past Surgical History:  Procedure Laterality Date    . BREAST REDUCTION SURGERY  09/2011  . TUBAL LIGATION  1996   bilateral    OB History    No data available       Home Medications    Prior to Admission medications   Medication Sig Start Date End Date Taking? Authorizing Provider  albuterol (PROVENTIL HFA;VENTOLIN HFA) 108 (90 Base) MCG/ACT inhaler Inhale 2 puffs into the lungs every 4 (four) hours as needed for wheezing or shortness of breath. 01/23/17   Montine Circle, PA-C  amLODipine (NORVASC) 10 MG tablet Take 1 tablet (10 mg total) by mouth daily. 12/13/16   Rai, Ripudeep Raliegh Ip, MD  arformoterol (BROVANA) 15 MCG/2ML NEBU Take 2 mLs (15 mcg total) by nebulization 2 (two) times daily. 01/02/17   Doreatha Lew, MD  azithromycin (ZITHROMAX) 250 MG tablet Take 1 tablet (250 mg total) by mouth daily. Take first 2 tablets together, then 1 every day until finished. 01/23/17   Montine Circle, PA-C  budesonide (PULMICORT) 0.25 MG/2ML nebulizer solution Take 2 mLs (0.25 mg total) by nebulization 2 (two) times daily. 01/02/17   Patrecia Pour, Christean Grief, MD  butalbital-acetaminophen-caffeine (FIORICET, ESGIC) [id number] MG tablet Take 1-2 tablets by mouth every 6 (six) hours as needed for headache. 12/19/16 12/19/17  Joy, Shawn C, PA-C  fluticasone (FLONASE) 50 MCG/ACT nasal spray Place 2 sprays into both nostrils daily. 12/13/16   Rai, Vernelle Emerald, MD  furosemide (LASIX) 40 MG tablet Take 1 tablet (40 mg total) by mouth daily. 01/17/17 01/17/18  Janece Canterbury, MD  guaiFENesin (MUCINEX) 600 MG 12 hr tablet Take 1 tablet (600 mg total) by mouth 2 (two) times daily. 12/13/16   Rai, Vernelle Emerald, MD  loratadine (CLARITIN) 10 MG tablet Take 1 tablet (10 mg total) by mouth daily. 08/30/15   [name], MD  losartan (COZAAR) 100 MG tablet Take 1 tablet (100 mg total) by mouth daily. 12/13/16   Rai, Vernelle Emerald, MD  metoprolol tartrate (LOPRESSOR) 25 MG tablet Take 1 tablet (25 mg total) by mouth 2 (two) times daily. 12/27/16   Arnoldo Morale, MD   mometasone-formoterol (DULERA) 200-5 MCG/ACT AERO Inhale 2 puffs into the lungs 2 (two) times daily. 01/06/17   Baird Lyons D, MD  montelukast (SINGULAIR) 10 MG tablet Take 1 tablet (10 mg total) by mouth at bedtime. 01/02/17   Patrecia Pour, Christean Grief, MD  nicotine (NICODERM CQ - DOSED IN MG/24 HOURS) 14 mg/24hr patch Place 1 patch (14 mg total) onto the skin daily. Patient taking differently: Place 14 mg onto the skin daily as needed (smoking cessation).  10/04/16   Reyne Dumas, MD  omeprazole (PRILOSEC) 20 MG capsule Take 1 capsule (20 mg total) by mouth daily. Patient taking differently: Take 20 mg by mouth 2 (two) times daily.  10/09/16   Deneise Lever, MD  predniSONE (DELTASONE) 20 MG tablet Take 2 tablets (40 mg total) by mouth daily. 01/23/17   Montine Circle, PA-C  sertraline (ZOLOFT) 100 MG tablet Take 1 tablet (100 mg total) by mouth daily. 12/27/16   Arnoldo Morale, MD  spironolactone (ALDACTONE) 100 MG tablet Take 1 tablet (100 mg total) by mouth daily. 01/17/17   Janece Canterbury, MD  zolpidem (AMBIEN) 5 MG tablet Take 1 tablet (5 mg total)  by mouth at bedtime as needed for sleep. 12/22/16   Cristal Ford, DO    Family History Family History  Problem Relation Age of Onset  . Hypertension Mother   . Asthma Daughter   . Cancer Paternal Aunt   . Asthma Maternal Grandmother     Social History Social History  Substance Use Topics  . Smoking status: Former Smoker    Packs/day: 0.50    Years: 18.00    Types: Cigarettes    Quit date: 04/29/2014  . Smokeless tobacco: Never Used  . Alcohol use No     Allergies   Patient has no known allergies.   Review of Systems Review of Systems  Constitutional: Negative for chills and fever.  HENT: Positive for sore throat. Negative for congestion and rhinorrhea.   Eyes: Negative for visual disturbance.  Respiratory: Positive for cough and shortness of breath. Negative for chest tightness.   Cardiovascular: Positive for leg swelling.  Negative for chest pain.  Gastrointestinal: Negative for abdominal pain, constipation, diarrhea, nausea and vomiting.  Genitourinary: Negative for dysuria and flank pain.  Musculoskeletal: Negative for back pain and myalgias.  Skin: Negative for rash.  Neurological: Positive for dizziness and headaches. Negative for syncope and light-headedness.     Physical Exam Updated Vital Signs BP (!) 150/89   Pulse 84   Temp 98.6 F (37 C) (Oral)   Resp 20   Ht 5\' 5"  (1.651 m)   Wt (!) 145.2 kg (320 lb)   SpO2 100%   BMI 53.25 kg/m   Physical Exam  Constitutional: She appears well-developed and well-nourished. No distress.  HENT:  Head: Normocephalic and atraumatic.  No erythema noted of the posterior pharynx. No exudate of pharynx.  Eyes: Pupils are equal, round, and reactive to light. Conjunctivae and EOM are normal.  Neck: Normal range of motion. Neck supple.  Cardiovascular: Normal rate, regular rhythm, S1 normal and S2 normal.   No murmur heard. 1+ nonpitting lower extremity edema. Right calf tenderness of the level of the mid calf.  Pulmonary/Chest: She has wheezes.  Patient converses but is dyspneic. Tachypnea noted. Patient has wheezes in all lung fields and loud end-expiratory wheezes. Diminished breath sounds in bilateral lung bases.  Abdominal: Soft. She exhibits no distension. There is no tenderness. There is no guarding.  Musculoskeletal: Normal range of motion. She exhibits no edema or deformity.  Lymphadenopathy:    She has no cervical adenopathy.  Neurological: She is alert.  Cranial nerves grossly intact. Patient was extremities with good coordination.  Skin: Skin is warm and dry. No rash noted. No erythema.  Psychiatric: She has a normal mood and affect. Her behavior is normal. Judgment and thought content normal.  Nursing note and vitals reviewed.    ED Treatments / Results  Labs (all labs ordered are listed, but only abnormal results are displayed) Labs  Reviewed  CBC  MAGNESIUM  D-DIMER, QUANTITATIVE (NOT AT [facility])  I-STAT CHEM 8, ED  CBG MONITORING, ED    EKG  EKG Interpretation None       Radiology Dg Chest 2 View  Result Date: 01/26/2017 CLINICAL DATA:  Asthma EXAM: CHEST  2 VIEW COMPARISON:  Chest radiograph 01/22/2017 FINDINGS: The heart size and mediastinal contours are within normal limits. Both lungs are clear. The visualized skeletal structures are unremarkable. IMPRESSION: No active cardiopulmonary disease. Electronically Signed   By: Ulyses Jarred M.D.   On: 01/26/2017 16:58    Procedures Procedures (including critical care time)  Medications  Ordered in ED Medications  magnesium sulfate IVPB 2 g 50 mL (not administered)  methylPREDNISolone sodium succinate (SOLU-MEDROL) 125 mg/2 mL injection 125 mg (not administered)  albuterol (PROVENTIL) (2.5 MG/3ML) 0.083% nebulizer solution 5 mg (5 mg Nebulization Given 01/26/17 1604)  albuterol (PROVENTIL,VENTOLIN) solution continuous neb (10 mg/hr Nebulization Given 01/26/17 2031)  ipratropium (ATROVENT) nebulizer solution 0.5 mg (0.5 mg Nebulization Given 01/26/17 2025)     Initial Impression / Assessment and Plan / ED Course  I have reviewed the triage vital signs and the nursing notes.  Pertinent labs & imaging results that were available during my care of the patient were reviewed by me and considered in my medical decision making (see chart for details).  Clinical Course as of Jan 27 129  Sun Jan 26, 2017  2114 I-stat chem 8 showed K of 8.5. Suspect hemolysis. Will order full BMP. EKG without abnormalities.  [AM]  2245 Patient reevaluated. Patient reports headache currently. Patient to receive Tylenol.  [AM]  Mon Jan 27, 2017  0129 Ambulated with patient to the bathroom. Desaturation to 91% and pulse to 108 with ambulation.  [AM]    Clinical Course User Index [AM] Albesa Seen, PA-C    Final Clinical Impressions(s) / ED Diagnoses   Final diagnoses:  None    MDM Patient with poorly controlled asthma presenting for recurrent asthma attack with an short period of time. Patient is notably wheezing upon exam. Due to some concerns about historical features of calf swelling and Tenderness on the right side, will evaluate for pulmonary embolism, as there is no clear trigger for why dyspnea is getting worse despite adequate medical therapy of asthma. Low suspicion for ACS, as patient is not having chest pain or tightness, and EKG without any ischemic abnormalities.   Upon workup the patient, her d-dimer noted to be 0.93, therefore CTPA ordered. Patient is continuing to have dyspnea and loud and expiratory wheezes despite maximal medical therapy. Potassium noted to be 2.9 today. We'll replete.This is likely secondary to albuterol treatments. Patient tachycardic at rest on a couple readings. This is likely due to the albuterol treatments. Ambulated with patient and desaturation to 91% occurred. Pulse up to 108. Patient may receive one additional treatment of Atrovent. If patient is still significantly dyspneic/wheezing and/or possible PE noted on CTPA, will seek admission.   Sign out given to Janetta Hora at [phone].   This is a supervised visit with Dr. Gareth Morgan. Evaluation, management, and discharge planning discussed with this attending physician.  New Prescriptions New Prescriptions   No medications on file     Tamala Julian 01/27/17 0140    Gareth Morgan, MD 01/27/17 1353

## 2017-01-26 NOTE — ED Triage Notes (Signed)
Pt presents for evaluation of sob, states seen here on Thursday. No improvement with prednisone, inhaler, and home neb treatments. States feels worse with dry cough.

## 2017-01-26 NOTE — ED Notes (Signed)
Nurse starting IV and collecting labs. 

## 2017-01-26 NOTE — ED Notes (Signed)
Attempted PIVx2; Second RN to attempt. 

## 2017-01-26 NOTE — ED Notes (Signed)
ED Provider at bedside. 

## 2017-01-27 ENCOUNTER — Emergency Department (HOSPITAL_COMMUNITY): Payer: Medicaid Other

## 2017-01-27 ENCOUNTER — Encounter (HOSPITAL_COMMUNITY): Payer: Self-pay | Admitting: Internal Medicine

## 2017-01-27 DIAGNOSIS — Z8619 Personal history of other infectious and parasitic diseases: Secondary | ICD-10-CM | POA: Diagnosis not present

## 2017-01-27 DIAGNOSIS — J4521 Mild intermittent asthma with (acute) exacerbation: Secondary | ICD-10-CM | POA: Diagnosis not present

## 2017-01-27 DIAGNOSIS — R000 Tachycardia, unspecified: Secondary | ICD-10-CM | POA: Diagnosis present

## 2017-01-27 DIAGNOSIS — I5032 Chronic diastolic (congestive) heart failure: Secondary | ICD-10-CM | POA: Diagnosis present

## 2017-01-27 DIAGNOSIS — F5101 Primary insomnia: Secondary | ICD-10-CM | POA: Diagnosis present

## 2017-01-27 DIAGNOSIS — K219 Gastro-esophageal reflux disease without esophagitis: Secondary | ICD-10-CM | POA: Diagnosis present

## 2017-01-27 DIAGNOSIS — Z79899 Other long term (current) drug therapy: Secondary | ICD-10-CM | POA: Diagnosis not present

## 2017-01-27 DIAGNOSIS — T380X5A Adverse effect of glucocorticoids and synthetic analogues, initial encounter: Secondary | ICD-10-CM | POA: Diagnosis present

## 2017-01-27 DIAGNOSIS — R51 Headache: Secondary | ICD-10-CM | POA: Diagnosis present

## 2017-01-27 DIAGNOSIS — D72828 Other elevated white blood cell count: Secondary | ICD-10-CM | POA: Diagnosis present

## 2017-01-27 DIAGNOSIS — F339 Major depressive disorder, recurrent, unspecified: Secondary | ICD-10-CM | POA: Diagnosis present

## 2017-01-27 DIAGNOSIS — R0602 Shortness of breath: Secondary | ICD-10-CM | POA: Diagnosis not present

## 2017-01-27 DIAGNOSIS — Z9989 Dependence on other enabling machines and devices: Secondary | ICD-10-CM | POA: Diagnosis not present

## 2017-01-27 DIAGNOSIS — J302 Other seasonal allergic rhinitis: Secondary | ICD-10-CM | POA: Diagnosis present

## 2017-01-27 DIAGNOSIS — Z91128 Patient's intentional underdosing of medication regimen for other reason: Secondary | ICD-10-CM | POA: Diagnosis not present

## 2017-01-27 DIAGNOSIS — Z9119 Patient's noncompliance with other medical treatment and regimen: Secondary | ICD-10-CM | POA: Diagnosis not present

## 2017-01-27 DIAGNOSIS — Z7951 Long term (current) use of inhaled steroids: Secondary | ICD-10-CM | POA: Diagnosis not present

## 2017-01-27 DIAGNOSIS — T486X6A Underdosing of antiasthmatics, initial encounter: Secondary | ICD-10-CM | POA: Diagnosis present

## 2017-01-27 DIAGNOSIS — Z87891 Personal history of nicotine dependence: Secondary | ICD-10-CM | POA: Diagnosis not present

## 2017-01-27 DIAGNOSIS — J449 Chronic obstructive pulmonary disease, unspecified: Secondary | ICD-10-CM | POA: Diagnosis present

## 2017-01-27 DIAGNOSIS — D72829 Elevated white blood cell count, unspecified: Secondary | ICD-10-CM | POA: Diagnosis not present

## 2017-01-27 DIAGNOSIS — G4733 Obstructive sleep apnea (adult) (pediatric): Secondary | ICD-10-CM | POA: Diagnosis present

## 2017-01-27 DIAGNOSIS — J45901 Unspecified asthma with (acute) exacerbation: Secondary | ICD-10-CM | POA: Diagnosis present

## 2017-01-27 DIAGNOSIS — Z6841 Body Mass Index (BMI) 40.0 and over, adult: Secondary | ICD-10-CM | POA: Diagnosis not present

## 2017-01-27 DIAGNOSIS — J4551 Severe persistent asthma with (acute) exacerbation: Secondary | ICD-10-CM | POA: Diagnosis not present

## 2017-01-27 DIAGNOSIS — E876 Hypokalemia: Secondary | ICD-10-CM | POA: Diagnosis present

## 2017-01-27 DIAGNOSIS — I110 Hypertensive heart disease with heart failure: Secondary | ICD-10-CM | POA: Diagnosis present

## 2017-01-27 LAB — CBC
HEMATOCRIT: 35.5 % — AB (ref 36.0–46.0)
HEMOGLOBIN: 11.2 g/dL — AB (ref 12.0–15.0)
MCH: 27.6 pg (ref 26.0–34.0)
MCHC: 31.5 g/dL (ref 30.0–36.0)
MCV: 87.4 fL (ref 78.0–100.0)
Platelets: 361 10*3/uL (ref 150–400)
RBC: 4.06 MIL/uL (ref 3.87–5.11)
RDW: 19.2 % — ABNORMAL HIGH (ref 11.5–15.5)
WBC: 15.2 10*3/uL — ABNORMAL HIGH (ref 4.0–10.5)

## 2017-01-27 LAB — COMPREHENSIVE METABOLIC PANEL
ALT: 18 U/L (ref 14–54)
AST: 26 U/L (ref 15–41)
Albumin: 3.1 g/dL — ABNORMAL LOW (ref 3.5–5.0)
Alkaline Phosphatase: 62 U/L (ref 38–126)
Anion gap: 12 (ref 5–15)
BILIRUBIN TOTAL: 0.2 mg/dL — AB (ref 0.3–1.2)
CO2: 22 mmol/L (ref 22–32)
Calcium: 8.1 mg/dL — ABNORMAL LOW (ref 8.9–10.3)
Chloride: 102 mmol/L (ref 101–111)
Creatinine, Ser: 0.8 mg/dL (ref 0.44–1.00)
GFR calc Af Amer: >60 mL/min (ref >60)
GFR calc non Af Amer: >60 mL/min (ref >60)
GLUCOSE: 190 mg/dL — AB (ref 65–99)
POTASSIUM: 3.7 mmol/L (ref 3.5–5.1)
Sodium: 136 mmol/L (ref 135–145)
Total Protein: 6.4 g/dL — ABNORMAL LOW (ref 6.5–8.1)

## 2017-01-27 LAB — MRSA PCR SCREENING: MRSA by PCR: POSITIVE — AB

## 2017-01-27 MED ORDER — ZOLPIDEM TARTRATE 5 MG PO TABS
10.0000 mg | ORAL_TABLET | Freq: Every evening | ORAL | Status: DC | PRN
  Administered 2017-01-28 – 2017-01-31 (×4): 10 mg via ORAL
  Filled 2017-01-27 (×6): qty 2

## 2017-01-27 MED ORDER — ENOXAPARIN SODIUM 40 MG/0.4ML ~~LOC~~ SOLN
40.0000 mg | SUBCUTANEOUS | Status: DC
  Administered 2017-01-27 – 2017-01-28 (×2): 40 mg via SUBCUTANEOUS
  Filled 2017-01-27 (×2): qty 0.4

## 2017-01-27 MED ORDER — MONTELUKAST SODIUM 10 MG PO TABS
10.0000 mg | ORAL_TABLET | Freq: Every day | ORAL | Status: DC
  Administered 2017-01-27 – 2017-01-31 (×5): 10 mg via ORAL
  Filled 2017-01-27 (×6): qty 1

## 2017-01-27 MED ORDER — ACETAMINOPHEN 325 MG PO TABS
650.0000 mg | ORAL_TABLET | Freq: Four times a day (QID) | ORAL | Status: DC | PRN
  Administered 2017-01-28 – 2017-01-30 (×2): 650 mg via ORAL
  Filled 2017-01-27 (×2): qty 2

## 2017-01-27 MED ORDER — METOPROLOL TARTRATE 25 MG PO TABS
25.0000 mg | ORAL_TABLET | Freq: Two times a day (BID) | ORAL | Status: DC
  Administered 2017-01-27 – 2017-01-31 (×10): 25 mg via ORAL
  Filled 2017-01-27 (×10): qty 1

## 2017-01-27 MED ORDER — ZOLPIDEM TARTRATE 5 MG PO TABS
5.0000 mg | ORAL_TABLET | Freq: Once | ORAL | Status: AC
  Administered 2017-01-28: 5 mg via ORAL
  Filled 2017-01-27: qty 1

## 2017-01-27 MED ORDER — SERTRALINE HCL 100 MG PO TABS
100.0000 mg | ORAL_TABLET | Freq: Every day | ORAL | Status: DC
  Administered 2017-01-27 – 2017-02-01 (×6): 100 mg via ORAL
  Filled 2017-01-27 (×6): qty 1

## 2017-01-27 MED ORDER — ALBUTEROL SULFATE HFA 108 (90 BASE) MCG/ACT IN AERS: 2.0000 | INHALATION_SPRAY | RESPIRATORY_TRACT | Status: DC | PRN

## 2017-01-27 MED ORDER — BUTALBITAL-APAP-CAFFEINE 50-325-40 MG PO TABS
1.0000 | ORAL_TABLET | Freq: Four times a day (QID) | ORAL | Status: DC | PRN
  Administered 2017-01-27: 2 via ORAL
  Administered 2017-01-28 – 2017-01-29 (×2): 1 via ORAL
  Administered 2017-01-30 – 2017-02-01 (×4): 2 via ORAL
  Filled 2017-01-27 (×3): qty 2
  Filled 2017-01-27: qty 1
  Filled 2017-01-27: qty 2
  Filled 2017-01-27: qty 1
  Filled 2017-01-27: qty 2

## 2017-01-27 MED ORDER — POTASSIUM CHLORIDE 10 MEQ/100ML IV SOLN
10.0000 meq | INTRAVENOUS | Status: AC
  Administered 2017-01-27 (×2): 10 meq via INTRAVENOUS
  Filled 2017-01-27 (×2): qty 100

## 2017-01-27 MED ORDER — FUROSEMIDE 40 MG PO TABS
40.0000 mg | ORAL_TABLET | Freq: Every day | ORAL | Status: DC
  Administered 2017-01-27 – 2017-02-01 (×6): 40 mg via ORAL
  Filled 2017-01-27 (×3): qty 1
  Filled 2017-01-27: qty 2
  Filled 2017-01-27 (×2): qty 1

## 2017-01-27 MED ORDER — FLUTICASONE PROPIONATE 50 MCG/ACT NA SUSP
2.0000 | Freq: Every day | NASAL | Status: DC
  Administered 2017-01-28 – 2017-02-01 (×4): 2 via NASAL
  Filled 2017-01-27 (×2): qty 16

## 2017-01-27 MED ORDER — ALBUTEROL SULFATE (2.5 MG/3ML) 0.083% IN NEBU
2.5000 mg | INHALATION_SOLUTION | Freq: Four times a day (QID) | RESPIRATORY_TRACT | Status: DC
  Administered 2017-01-27 (×2): 2.5 mg via RESPIRATORY_TRACT
  Filled 2017-01-27 (×3): qty 3

## 2017-01-27 MED ORDER — ALBUTEROL SULFATE (2.5 MG/3ML) 0.083% IN NEBU
2.5000 mg | INHALATION_SOLUTION | Freq: Four times a day (QID) | RESPIRATORY_TRACT | Status: DC | PRN
Start: 2017-01-27 — End: 2017-01-29
  Administered 2017-01-27 – 2017-01-28 (×2): 2.5 mg via RESPIRATORY_TRACT
  Filled 2017-01-27 (×2): qty 3

## 2017-01-27 MED ORDER — AMLODIPINE BESYLATE 10 MG PO TABS
10.0000 mg | ORAL_TABLET | Freq: Every day | ORAL | Status: DC
  Administered 2017-01-27 – 2017-02-01 (×6): 10 mg via ORAL
  Filled 2017-01-27 (×5): qty 1
  Filled 2017-01-27: qty 2

## 2017-01-27 MED ORDER — TIOTROPIUM BROMIDE MONOHYDRATE 18 MCG IN CAPS
18.0000 ug | ORAL_CAPSULE | Freq: Every day | RESPIRATORY_TRACT | Status: DC
  Administered 2017-01-28 – 2017-01-29 (×2): 18 ug via RESPIRATORY_TRACT
  Filled 2017-01-27: qty 5

## 2017-01-27 MED ORDER — SPIRONOLACTONE 25 MG PO TABS
100.0000 mg | ORAL_TABLET | Freq: Every day | ORAL | Status: DC
  Administered 2017-01-27 – 2017-02-01 (×6): 100 mg via ORAL
  Filled 2017-01-27 (×5): qty 4
  Filled 2017-01-27: qty 1

## 2017-01-27 MED ORDER — AZITHROMYCIN 500 MG PO TABS
250.0000 mg | ORAL_TABLET | Freq: Every day | ORAL | Status: DC
  Administered 2017-01-27 – 2017-02-01 (×6): 250 mg via ORAL
  Filled 2017-01-27 (×6): qty 1

## 2017-01-27 MED ORDER — MOMETASONE FURO-FORMOTEROL FUM 200-5 MCG/ACT IN AERO
2.0000 | INHALATION_SPRAY | Freq: Two times a day (BID) | RESPIRATORY_TRACT | Status: DC
  Filled 2017-01-27: qty 8.8

## 2017-01-27 MED ORDER — ALBUTEROL SULFATE (2.5 MG/3ML) 0.083% IN NEBU
2.5000 mg | INHALATION_SOLUTION | Freq: Three times a day (TID) | RESPIRATORY_TRACT | Status: DC
  Administered 2017-01-28 – 2017-01-29 (×5): 2.5 mg via RESPIRATORY_TRACT
  Filled 2017-01-27 (×5): qty 3

## 2017-01-27 MED ORDER — IOPAMIDOL (ISOVUE-370) INJECTION 76%
INTRAVENOUS | Status: AC
  Administered 2017-01-27: 100 mL
  Filled 2017-01-27: qty 100

## 2017-01-27 MED ORDER — CHLORHEXIDINE GLUCONATE CLOTH 2 % EX PADS
6.0000 | MEDICATED_PAD | Freq: Every day | CUTANEOUS | Status: DC
  Administered 2017-01-28 – 2017-01-31 (×4): 6 via TOPICAL

## 2017-01-27 MED ORDER — GUAIFENESIN ER 600 MG PO TB12
600.0000 mg | ORAL_TABLET | Freq: Two times a day (BID) | ORAL | Status: DC
  Administered 2017-01-27 – 2017-02-01 (×10): 600 mg via ORAL
  Filled 2017-01-27 (×10): qty 1

## 2017-01-27 MED ORDER — PANTOPRAZOLE SODIUM 40 MG PO TBEC
40.0000 mg | DELAYED_RELEASE_TABLET | Freq: Every day | ORAL | Status: DC
  Administered 2017-01-27 – 2017-02-01 (×6): 40 mg via ORAL
  Filled 2017-01-27 (×6): qty 1

## 2017-01-27 MED ORDER — MUPIROCIN 2 % EX OINT
1.0000 "application " | TOPICAL_OINTMENT | Freq: Two times a day (BID) | CUTANEOUS | Status: AC
  Administered 2017-01-27 – 2017-02-01 (×10): 1 via NASAL
  Filled 2017-01-27 (×4): qty 22

## 2017-01-27 MED ORDER — LORATADINE 10 MG PO TABS
10.0000 mg | ORAL_TABLET | Freq: Every day | ORAL | Status: DC
  Administered 2017-01-27 – 2017-02-01 (×6): 10 mg via ORAL
  Filled 2017-01-27 (×6): qty 1

## 2017-01-27 MED ORDER — NICOTINE 14 MG/24HR TD PT24
14.0000 mg | MEDICATED_PATCH | Freq: Every day | TRANSDERMAL | Status: DC | PRN
  Administered 2017-01-27: 14 mg via TRANSDERMAL
  Filled 2017-01-27 (×3): qty 1

## 2017-01-27 MED ORDER — IPRATROPIUM BROMIDE 0.02 % IN SOLN
0.5000 mg | Freq: Once | RESPIRATORY_TRACT | Status: AC
  Administered 2017-01-27: 0.5 mg via RESPIRATORY_TRACT
  Filled 2017-01-27: qty 2.5

## 2017-01-27 MED ORDER — ACETAMINOPHEN 650 MG RE SUPP: 650.0000 mg | Freq: Four times a day (QID) | RECTAL | Status: DC | PRN

## 2017-01-27 MED ORDER — LOSARTAN POTASSIUM 50 MG PO TABS
100.0000 mg | ORAL_TABLET | Freq: Every day | ORAL | Status: DC
  Administered 2017-01-27 – 2017-02-01 (×6): 100 mg via ORAL
  Filled 2017-01-27 (×6): qty 2

## 2017-01-27 MED ORDER — METHYLPREDNISOLONE SODIUM SUCC 125 MG IJ SOLR
60.0000 mg | Freq: Three times a day (TID) | INTRAMUSCULAR | Status: DC
Start: 2017-01-27 — End: 2017-01-28
  Administered 2017-01-27 – 2017-01-28 (×3): 60 mg via INTRAVENOUS
  Filled 2017-01-27 (×3): qty 2

## 2017-01-27 MED ORDER — ZOLPIDEM TARTRATE 5 MG PO TABS
5.0000 mg | ORAL_TABLET | Freq: Every evening | ORAL | Status: DC | PRN
  Administered 2017-01-27: 5 mg via ORAL
  Filled 2017-01-27: qty 1

## 2017-01-27 NOTE — Progress Notes (Signed)
Report received from [place] at the ED. Pt arrived to the unit at 1345 via wheelchair.

## 2017-01-27 NOTE — ED Notes (Signed)
Provider bedside.

## 2017-01-27 NOTE — Hospital Discharge Follow-Up (Signed)
The patient is known to [facility]. She has an appointment scheduled for 01/29/17 @ 1400 and the information will be placed on the AVS. She receives 80 hours/month of PCS and has been referred to [facility]. A SCAT application has been completed and she is waiting to schedule an assessment.  The patient called this CM requesting a different diet, not heart healthy. Informed her that this CM would notify her CM on her floor.  Update provided to Whitman Hero, RN CM

## 2017-01-27 NOTE — H&P (Addendum)
TRH H&P   Patient Demographics:    April [name], is a 46 y.o. female  MRN: [id number]   DOB - Sep 25, 1970  Admit Date - 01/26/2017  Outpatient Primary MD for the patient is Arnoldo Morale, MD  Referring MD/NP/PA:  Langston Masker  Outpatient Specialists:  Baird Lyons   Patient coming from: home  Chief Complaint  Patient presents with  . Asthma      HPI:    April [name]  is a 46 y.o. female, w Asthma, OSA not on cpap, morbid obeisity, apparently c/o dyspnea for the past week that grew worse yesterday.  + wheezing.  Slight dry cough.  Denies fever, chills, cp, palp, wt gain, edema.   In ED, pt tx with albuterol neb, atrovent neb, solumedrol without relief.  CXR => no acute process CTA chest=> IMPRESSION: 1. No evidence of significant pulmonary embolus. 2. No evidence of active pulmonary disease.  Pt will be admitted for asthma exacerbation   Review of systems:    In addition to the HPI above, * No Fever-chills, No Headache, No changes with Vision or hearing, No problems swallowing food or Liquids, No Chest pain No Abdominal pain, No Nausea or Vommitting, Bowel movements are regular, No Blood in stool or Urine, No dysuria, No new skin rashes or bruises, No new joints pains-aches,  No new weakness, tingling, numbness in any extremity, No recent weight gain or loss, No polyuria, polydypsia or polyphagia, No significant Mental Stressors.  A full 10 point Review of Systems was done, except as stated above, all other Review of Systems were negative.   With Past History of the following :    Past Medical History:  Diagnosis Date  . Acanthosis nigricans   . Arthritis   . Asthma    Followed by Dr. Annamaria Boots (pulmonology); receives every other week omalizumab injections; has frequent exacerbations  . COPD (chronic obstructive pulmonary disease) ([place])    PFTs in 2002,  FEV1/FVC 65, no post bronchodilater test done  . Depression   . GERD (gastroesophageal reflux disease)   . Headache(784.0)   . Helicobacter pylori (H. pylori) infection   . Hypertension, essential   . Insomnia   . Menorrhagia   . Morbid obesity ([place])   . Obesity   . Seasonal allergies   . Shortness of breath   . Sleep apnea    Sleep study 2008 - mild OSA, not enough events to titrate CPAP  . Tobacco user       Past Surgical History:  Procedure Laterality Date  . BREAST REDUCTION SURGERY  09/2011  . TUBAL LIGATION  1996   bilateral      Social History:     Social History  Substance Use Topics  . Smoking status: Former Smoker    Packs/day: 0.50    Years: 18.00    Types: Cigarettes    Quit date: 04/29/2014  .  Smokeless tobacco: Never Used  . Alcohol use No     Lives - at home  Mobility - walks by self  Family History :     Family History  Problem Relation Age of Onset  . Hypertension Mother   . Asthma Daughter   . Cancer Paternal Aunt   . Asthma Maternal Grandmother       Home Medications:   Prior to Admission medications   Medication Sig Start Date End Date Taking? Authorizing Provider  albuterol (PROVENTIL HFA;VENTOLIN HFA) 108 (90 Base) MCG/ACT inhaler Inhale 2 puffs into the lungs every 4 (four) hours as needed for wheezing or shortness of breath. 01/23/17  Yes Montine Circle, PA-C  amLODipine (NORVASC) 10 MG tablet Take 1 tablet (10 mg total) by mouth daily. 12/13/16  Yes Rai, Ripudeep K, MD  arformoterol (BROVANA) 15 MCG/2ML NEBU Take 2 mLs (15 mcg total) by nebulization 2 (two) times daily. 01/02/17  Yes Patrecia Pour, Christean Grief, MD  azithromycin (ZITHROMAX) 250 MG tablet Take 1 tablet (250 mg total) by mouth daily. Take first 2 tablets together, then 1 every day until finished. 01/23/17  Yes Montine Circle, PA-C  budesonide (PULMICORT) 0.25 MG/2ML nebulizer solution Take 2 mLs (0.25 mg total) by nebulization 2 (two) times daily. 01/02/17  Yes Patrecia Pour,  Christean Grief, MD  butalbital-acetaminophen-caffeine (FIORICET, ESGIC) [phone] MG tablet Take 1-2 tablets by mouth every 6 (six) hours as needed for headache. 12/19/16 12/19/17 Yes Joy, Shawn C, PA-C  fluticasone (FLONASE) 50 MCG/ACT nasal spray Place 2 sprays into both nostrils daily. 12/13/16  Yes Rai, Ripudeep K, MD  furosemide (LASIX) 40 MG tablet Take 1 tablet (40 mg total) by mouth daily. 01/17/17 01/17/18 Yes Short, Noah Delaine, MD  guaiFENesin (MUCINEX) 600 MG 12 hr tablet Take 1 tablet (600 mg total) by mouth 2 (two) times daily. 12/13/16  Yes Rai, Ripudeep K, MD  loratadine (CLARITIN) 10 MG tablet Take 1 tablet (10 mg total) by mouth daily. 08/30/15  Yes [name], MD  losartan (COZAAR) 100 MG tablet Take 1 tablet (100 mg total) by mouth daily. 12/13/16  Yes Rai, Ripudeep K, MD  metoprolol tartrate (LOPRESSOR) 25 MG tablet Take 1 tablet (25 mg total) by mouth 2 (two) times daily. 12/27/16  Yes Amao, Charlane Ferretti, MD  mometasone-formoterol (DULERA) 200-5 MCG/ACT AERO Inhale 2 puffs into the lungs 2 (two) times daily. 01/06/17  Yes Young, Tarri Fuller D, MD  montelukast (SINGULAIR) 10 MG tablet Take 1 tablet (10 mg total) by mouth at bedtime. 01/02/17  Yes Patrecia Pour, Christean Grief, MD  nicotine (NICODERM CQ - DOSED IN MG/24 HOURS) 14 mg/24hr patch Place 1 patch (14 mg total) onto the skin daily. Patient taking differently: Place 14 mg onto the skin daily as needed (smoking cessation).  10/04/16  Yes Reyne Dumas, MD  omeprazole (PRILOSEC) 20 MG capsule Take 1 capsule (20 mg total) by mouth daily. Patient taking differently: Take 20 mg by mouth 2 (two) times daily.  10/09/16  Yes Young, Tarri Fuller D, MD  predniSONE (DELTASONE) 20 MG tablet Take 2 tablets (40 mg total) by mouth daily. 01/23/17  Yes Montine Circle, PA-C  sertraline (ZOLOFT) 100 MG tablet Take 1 tablet (100 mg total) by mouth daily. 12/27/16  Yes Arnoldo Morale, MD  spironolactone (ALDACTONE) 100 MG tablet Take 1 tablet (100 mg total) by mouth daily. 01/17/17  Yes  Short, Noah Delaine, MD  zolpidem (AMBIEN) 5 MG tablet Take 1 tablet (5 mg total) by mouth at bedtime as needed for sleep. 12/22/16  Yes  Mikhail, Velta Addison, DO     Allergies:    No Known Allergies   Physical Exam:   Vitals  Blood pressure (!) 124/99, pulse 82, temperature 98.6 F (37 C), temperature source Oral, resp. rate (!) 22, height 5\' 5"  (1.651 m), weight (!) 145.2 kg (320 lb), SpO2 93 %.   1. General lying in bed in NAD,   2. Normal affect and insight, Not Suicidal or Homicidal, Awake Alert, Oriented X 3.  3. No F.N deficits, ALL C.Nerves Intact, Strength 5/5 all 4 extremities, Sensation intact all 4 extremities, Plantars down going.  4. Ears and Eyes appear Normal, Conjunctivae clear, PERRLA. Moist Oral Mucosa.  5. Supple Neck, No JVD, No cervical lymphadenopathy appriciated, No Carotid Bruits.  6. Symmetrical Chest wall movement,  Tight,  + bilateral exp wheezing, no crackles.   7. RRR, No Gallops, Rubs or Murmurs, No Parasternal Heave.  8. Positive Bowel Sounds, Abdomen Soft, No tenderness, No organomegaly appriciated,No rebound -guarding or rigidity.  9.  No Cyanosis, Normal Skin Turgor, No Skin Rash or Bruise.  10. Good muscle tone,  joints appear normal , no effusions, Normal ROM.  11. No Palpable Lymph Nodes in Neck or Axillae     Data Review:    CBC  Recent Labs Lab 01/22/17 2035 01/26/17 2014  WBC 16.9* 17.4*  HGB 12.3 12.4  HCT 39.3 39.0  PLT 306 245  MCV 88.5 88.6  MCH 27.7 28.2  MCHC 31.3 31.8  RDW 20.1* 19.9*   ------------------------------------------------------------------------------------------------------------------  Chemistries   Recent Labs Lab 01/22/17 2035 01/26/17 2240  NA 135 134*  K 3.1* 2.9*  CL 101 102  CO2 28 25  GLUCOSE 107* 120*  BUN <5* <5*  CREATININE 0.86 0.65  CALCIUM 8.1* 7.7*  MG  --  1.7    ------------------------------------------------------------------------------------------------------------------ estimated creatinine clearance is 130.8 mL/min (by C-G formula based on SCr of 0.65 mg/dL). ------------------------------------------------------------------------------------------------------------------ No results for input(s): TSH, T4TOTAL, T3FREE, THYROIDAB in the last 72 hours.  Invalid input(s): FREET3  Coagulation profile No results for input(s): INR, PROTIME in the last 168 hours. -------------------------------------------------------------------------------------------------------------------  Recent Labs  01/26/17 2014  DDIMER 0.93*   -------------------------------------------------------------------------------------------------------------------  Cardiac Enzymes No results for input(s): CKMB, TROPONINI, MYOGLOBIN in the last 168 hours.  Invalid input(s): CK ------------------------------------------------------------------------------------------------------------------    Component Value Date/Time   BNP 19.7 01/17/2017 0623     ---------------------------------------------------------------------------------------------------------------  Urinalysis    Component Value Date/Time   COLORURINE YELLOW 01/01/2017 0935   APPEARANCEUR HAZY (A) 01/01/2017 0935   LABSPEC 1.012 01/01/2017 0935   PHURINE 5.0 01/01/2017 0935   GLUCOSEU 50 (A) 01/01/2017 0935   GLUCOSEU NEG mg/dL 10/28/2007 2049   HGBUR NEGATIVE 01/01/2017 0935   BILIRUBINUR NEGATIVE 01/01/2017 0935   KETONESUR NEGATIVE 01/01/2017 0935   PROTEINUR NEGATIVE 01/01/2017 0935   UROBILINOGEN 1.0 11/21/2014 0707   NITRITE NEGATIVE 01/01/2017 0935   LEUKOCYTESUR NEGATIVE 01/01/2017 0935    ----------------------------------------------------------------------------------------------------------------   Imaging Results:    Dg Chest 2 View  Result Date: 01/26/2017 CLINICAL DATA:   Asthma EXAM: CHEST  2 VIEW COMPARISON:  Chest radiograph 01/22/2017 FINDINGS: The heart size and mediastinal contours are within normal limits. Both lungs are clear. The visualized skeletal structures are unremarkable. IMPRESSION: No active cardiopulmonary disease. Electronically Signed   By: Ulyses Jarred M.D.   On: 01/26/2017 16:58   Ct Angio Chest Pe W/cm &/or Wo Cm  Result Date: 01/27/2017 CLINICAL DATA:  Shortness of breath for 1 week. Positive D-dimer. Intermediate clinical probability.  EXAM: CT ANGIOGRAPHY CHEST WITH CONTRAST TECHNIQUE: Multidetector CT imaging of the chest was performed using the standard protocol during bolus administration of intravenous contrast. Multiplanar CT image reconstructions and MIPs were obtained to evaluate the vascular anatomy. CONTRAST:  74 mL Isovue 370 COMPARISON:  10/02/2016 FINDINGS: Cardiovascular: Contrast bolus somewhat limits opacification of the pulmonary arteries but the central and proximal segmental pulmonary arteries are well opacified. No evidence of significant central pulmonary embolus. Can't exclude smaller peripheral emboli. Normal heart size. No pericardial effusion. Normal caliber thoracic aorta. No aortic dissection. Great vessel origins are patent. Mediastinum/Nodes: No enlarged mediastinal, hilar, or axillary lymph nodes. Thyroid gland, trachea, and esophagus demonstrate no significant findings. Lungs/Pleura: Evaluation of lungs is limited due to respiratory motion artifact. There is no gross consolidation or airspace disease. No pleural effusions. No pneumothorax. Upper Abdomen: No acute abnormality. Musculoskeletal: No chest wall abnormality. No acute or significant osseous findings. Review of the MIP images confirms the above findings. IMPRESSION: 1. No evidence of significant pulmonary embolus. 2. No evidence of active pulmonary disease. Electronically Signed   By: Lucienne Capers M.D.   On: 01/27/2017 01:38      Assessment & Plan:     Principal Problem:   Asthma exacerbation Active Problems:   Hypokalemia   Leukocytosis    Asthma exacerbation Solumedrol 80mg  iv q8h Albuterol neb q6h and q6h prn  spiriva 1puff qday singulair 10mg  po qday zithromax 250mg  po qday  Hypokalemia Replete Check bmp in am  Leukocytosis Check cbc in am  Hypertension Cont current medications  CHF (EF 65%) Cont metoprolol, lasix, losartan, spironolactone, amlodipine  DVT Prophylaxis Lovenox  SCDs   AM Labs Ordered, also please review Full Orders  Family Communication: Admission, patients condition and plan of care including tests being ordered have been discussed with the patient  who indicate understanding and agree with the plan and Code Status.  Code Status FULL CODE  Likely DC to  home  Condition GUARDED    Consults called:   Admission status: observation  Time spent in minutes : 45   Jani Gravel M.D on 01/27/2017 at 3:31 AM  Between 7am to 7pm - Pager - [phone]. After 7pm go to [email or web address] - password [facility]  [facility] - Office  [phone]

## 2017-01-27 NOTE — Progress Notes (Signed)
April [name] is a 46 y.o. female patient admitted from ED awake, alert - oriented  X 4 - no acute distress noted.  VSS - Blood pressure (!) 151/108, pulse 87, temperature 98.6 F (37 C), temperature source Oral, resp. rate 18, height 5\' 5"  (1.651 m), weight (!) 145.2 kg (320 lb), SpO2 97 %.    IV in place, occlusive dsg intact without redness.  Orientation to room, and floor completed with information packet given to patient/family.  Patient declined safety video at this time.  Admission INP armband ID verified with patient/family, and in place.   SR up x 2, fall assessment complete, with patient and family able to verbalize understanding of risk associated with falls, and verbalized understanding to call nsg before up out of bed.  Call light within reach, patient able to voice, and demonstrate understanding.  Skin, clean-dry- intact without evidence of bruising, or skin tears.   No evidence of skin break down noted on exam.     Will cont to eval and treat per MD orders.  Dorris Carnes, RN 01/27/2017 2:07 PM

## 2017-01-27 NOTE — ED Notes (Signed)
Pt to bathroom w/ the aid of wheelchair, pt short of breath as she gets back in bed

## 2017-01-27 NOTE — ED Notes (Signed)
Pt to bathroom via wheelchair, labored breathing when placed back in bed.

## 2017-01-27 NOTE — ED Notes (Signed)
Patient transported to CT 

## 2017-01-27 NOTE — ED Provider Notes (Signed)
Pt signed out to me by April Valere Dross PA-C. She is April 46 year old female with uncontrolled asthma and frequent admissions who presents with wheezing and SOB. She was last seen on 9/27 and was prescribed Z-pack and steroids with no improvement. Additionally she complained of right calf pain and has had tachycardia tonight. D-dimer was elevated and CTA is pending at shift change. She has received multiple neb treatments in the ED, steroids, and Mag. Potassium noted to be 2.9 - IV potassium was given.   CTA is negative. Ambulatory sats are 91% however she is still complaining of SOB and has wheezing on exam. Will consult hospitalist for obs admission.   Recardo Evangelist, PA-C 01/27/17 0354    Merryl Hacker, MD 01/28/17 [phone]

## 2017-01-27 NOTE — Progress Notes (Signed)
Patient admitted after midnight, please see H&P.  Has follow up with pulm on 10/10.  ? Compliance.  IV steroids, wean as tolerated.  Still wheezing.  Morbidly obese-- asked for recent weight.  Eulogio Bear DO

## 2017-01-27 NOTE — Telephone Encounter (Signed)
I called Ms.Pudlo again today, I still haven't been able to reach her. Lmom.

## 2017-01-27 NOTE — ED Notes (Signed)
Ambulated to bathroom alone , states she is sob  However she is much better than she was.

## 2017-01-28 ENCOUNTER — Telehealth: Payer: Self-pay

## 2017-01-28 MED ORDER — METHYLPREDNISOLONE SODIUM SUCC 40 MG IJ SOLR
40.0000 mg | Freq: Three times a day (TID) | INTRAMUSCULAR | Status: DC
  Filled 2017-01-28: qty 1

## 2017-01-28 MED ORDER — POLYETHYLENE GLYCOL 3350 17 G PO PACK
17.0000 g | PACK | Freq: Every day | ORAL | Status: DC
  Administered 2017-01-28 – 2017-02-01 (×5): 17 g via ORAL
  Filled 2017-01-28 (×5): qty 1

## 2017-01-28 MED ORDER — BUDESONIDE 0.25 MG/2ML IN SUSP
0.2500 mg | Freq: Two times a day (BID) | RESPIRATORY_TRACT | Status: DC
  Administered 2017-01-28 – 2017-01-29 (×3): 0.25 mg via RESPIRATORY_TRACT
  Filled 2017-01-28 (×3): qty 2

## 2017-01-28 MED ORDER — PREDNISONE 20 MG PO TABS: 40.0000 mg | ORAL_TABLET | Freq: Every day | ORAL | 0 refills | Status: DC

## 2017-01-28 MED ORDER — GUAIFENESIN 100 MG/5ML PO SOLN
5.0000 mL | ORAL | Status: DC | PRN
  Administered 2017-01-28 – 2017-01-29 (×2): 100 mg via ORAL
  Filled 2017-01-28 (×2): qty 5

## 2017-01-28 MED ORDER — NICOTINE 14 MG/24HR TD PT24
14.0000 mg | MEDICATED_PATCH | Freq: Every day | TRANSDERMAL | Status: DC
  Administered 2017-01-28 – 2017-01-30 (×3): 14 mg via TRANSDERMAL
  Filled 2017-01-28 (×3): qty 1

## 2017-01-28 MED ORDER — PREDNISONE 20 MG PO TABS: 40.0000 mg | ORAL_TABLET | Freq: Every day | ORAL | Status: DC

## 2017-01-28 MED ORDER — ARFORMOTEROL TARTRATE 15 MCG/2ML IN NEBU
15.0000 ug | INHALATION_SOLUTION | Freq: Two times a day (BID) | RESPIRATORY_TRACT | Status: DC
  Administered 2017-01-28 – 2017-01-29 (×3): 15 ug via RESPIRATORY_TRACT
  Filled 2017-01-28 (×3): qty 2

## 2017-01-28 MED ORDER — PREDNISONE 20 MG PO TABS
40.0000 mg | ORAL_TABLET | Freq: Two times a day (BID) | ORAL | Status: DC
  Administered 2017-01-28 – 2017-01-29 (×2): 40 mg via ORAL
  Filled 2017-01-28 (×2): qty 2

## 2017-01-28 NOTE — Progress Notes (Signed)
PROGRESS NOTE    JELENA MALICOAT  NOI:[id number] DOB: [birth year] DOA: 01/26/2017 PCP: Arnoldo Morale, MD   Outpatient Specialists:     Brief Narrative:  Crystina Borrayo  is a 46 y.o. female, w Asthma, OSA not on cpap, morbid obeisity, apparently c/o dyspnea for the past week that grew worse yesterday.  + wheezing.  Slight dry cough.  Denies fever, chills, cp, palp, wt gain, edema.   In ED, pt tx with albuterol neb, atrovent neb, solumedrol without relief.  CXR => no acute process CTA chest=> IMPRESSION: 1. No evidence of significant pulmonary embolus. 2. No evidence of active pulmonary disease.  Pt will be admitted for asthma exacerbation   Assessment & Plan:   Principal Problem:   Asthma exacerbation Active Problems:   Hypokalemia   Leukocytosis  Asthma exacerbation -wean to PO steroids Albuterol neb q6h and q6h prn  -resume home nebs/inhalers per [facility]-- will need pulm to clarify regimen at next office visit -was able to walk around until on RA with sats of 100% -suspect some de-conditioning contributing to continued SOB Hypokalemia Repleted  Morbid obesity Body mass index is 56.85 kg/m.  Leukocytosis From steroids, afebrile  Hypertension Cont current medications -well controlled in hospital  Chronic diastolic CHF (EF 69%) Cont metoprolol, lasix, losartan, spironolactone, amlodipine  Medical non-compliance -patient does not know what medications she is supposed to be on-- brovana vs pulmacort vs another--- does not appear to take BP medications at home either as BP elevated in ER but once medications given, she   DVT prophylaxis:  SCD's  Code Status: Full Code   Family Communication:   Disposition Plan:  Home in AM   Consultants:      Subjective: Able to walk around unit on RA-- still feels SOB  Objective: Vitals:   01/28/17 0610 01/28/17 0840 01/28/17 0921 01/28/17 0946  BP: 125/65 125/65    Pulse: 72     Resp: 18     Temp: 98.2 F  (36.8 C)     TempSrc: Oral     SpO2: 92%  100% 98%  Weight: (!) 154.9 kg (341 lb 9.6 oz)     Height:       No intake or output data in the 24 hours ending 01/28/17 1429 Filed Weights   01/26/17 1553 01/28/17 0610  Weight: (!) 145.2 kg (320 lb) (!) 154.9 kg (341 lb 9.6 oz)    Examination:  General exam: NAD Respiratory system: diminished, wheezing expiratory only Cardiovascular system: S1 & S2 heard, RRR. No JVD, murmurs, rubs, gallops or clicks. No pedal edema. Gastrointestinal system: +BS, obese Central nervous system: Alert + oriented Extremities: moves all 4 ext     Data Reviewed: I have personally reviewed following labs and imaging studies  CBC:  Recent Labs Lab 01/22/17 2035 01/26/17 2014 01/27/17 1112  WBC 16.9* 17.4* 15.2*  HGB 12.3 12.4 11.2*  HCT 39.3 39.0 35.5*  MCV 88.5 88.6 87.4  PLT 306 245 450   Basic Metabolic Panel:  Recent Labs Lab 01/22/17 2035 01/26/17 2240 01/27/17 1112  NA 135 134* 136  K 3.1* 2.9* 3.7  CL 101 102 102  CO2 28 25 22   GLUCOSE 107* 120* 190*  BUN <5* <5* <5*  CREATININE 0.86 0.65 0.80  CALCIUM 8.1* 7.7* 8.1*  MG  --  1.7  --    GFR: Estimated Creatinine Clearance: 136.3 mL/min (by C-G formula based on SCr of 0.8 mg/dL). Liver Function Tests:  Recent Labs Lab 01/27/17 1112  AST 26  ALT 18  ALKPHOS 62  BILITOT 0.2*  PROT 6.4*  ALBUMIN 3.1*   No results for input(s): LIPASE, AMYLASE in the last 168 hours. No results for input(s): AMMONIA in the last 168 hours. Coagulation Profile: No results for input(s): INR, PROTIME in the last 168 hours. Cardiac Enzymes: No results for input(s): CKTOTAL, CKMB, CKMBINDEX, TROPONINI in the last 168 hours. BNP (last 3 results) No results for input(s): PROBNP in the last 8760 hours. HbA1C: No results for input(s): HGBA1C in the last 72 hours. CBG:  Recent Labs Lab 01/26/17 2206  GLUCAP 100*   Lipid Profile: No results for input(s): CHOL, HDL, LDLCALC, TRIG,  CHOLHDL, LDLDIRECT in the last 72 hours. Thyroid Function Tests: No results for input(s): TSH, T4TOTAL, FREET4, T3FREE, THYROIDAB in the last 72 hours. Anemia Panel: No results for input(s): VITAMINB12, FOLATE, FERRITIN, TIBC, IRON, RETICCTPCT in the last 72 hours. Urine analysis:    Component Value Date/Time   COLORURINE YELLOW 01/01/2017 0935   APPEARANCEUR HAZY (A) 01/01/2017 0935   LABSPEC 1.012 01/01/2017 0935   PHURINE 5.0 01/01/2017 0935   GLUCOSEU 50 (A) 01/01/2017 0935   GLUCOSEU NEG mg/dL 10/28/2007 2049   HGBUR NEGATIVE 01/01/2017 0935   BILIRUBINUR NEGATIVE 01/01/2017 0935   KETONESUR NEGATIVE 01/01/2017 0935   PROTEINUR NEGATIVE 01/01/2017 0935   UROBILINOGEN 1.0 11/21/2014 0707   NITRITE NEGATIVE 01/01/2017 0935   LEUKOCYTESUR NEGATIVE 01/01/2017 0935     ) Recent Results (from the past 240 hour(s))  MRSA PCR Screening     Status: Abnormal   Collection Time: 01/27/17  2:14 PM  Result Value Ref Range Status   MRSA by PCR POSITIVE (A) NEGATIVE Final    Comment:        The GeneXpert MRSA Assay (FDA approved for NASAL specimens only), is one component of a comprehensive MRSA colonization surveillance program. It is not intended to diagnose MRSA infection nor to guide or monitor treatment for MRSA infections. RESULT CALLED TO, READ BACK BY AND VERIFIED WITH: Micheline Rough RN 17:15 01/27/17 ([email or web address])       Anti-infectives    Start     Dose/Rate Route Frequency Ordered Stop   01/27/17 0430  azithromycin (ZITHROMAX) tablet 250 mg     250 mg Oral Daily 01/27/17 0345         Radiology Studies: Dg Chest 2 View  Result Date: 01/26/2017 CLINICAL DATA:  Asthma EXAM: CHEST  2 VIEW COMPARISON:  Chest radiograph 01/22/2017 FINDINGS: The heart size and mediastinal contours are within normal limits. Both lungs are clear. The visualized skeletal structures are unremarkable. IMPRESSION: No active cardiopulmonary disease. Electronically Signed   By: Ulyses Jarred M.D.    On: 01/26/2017 16:58   Ct Angio Chest Pe W/cm &/or Wo Cm  Result Date: 01/27/2017 CLINICAL DATA:  Shortness of breath for 1 week. Positive D-dimer. Intermediate clinical probability. EXAM: CT ANGIOGRAPHY CHEST WITH CONTRAST TECHNIQUE: Multidetector CT imaging of the chest was performed using the standard protocol during bolus administration of intravenous contrast. Multiplanar CT image reconstructions and MIPs were obtained to evaluate the vascular anatomy. CONTRAST:  74 mL Isovue 370 COMPARISON:  10/02/2016 FINDINGS: Cardiovascular: Contrast bolus somewhat limits opacification of the pulmonary arteries but the central and proximal segmental pulmonary arteries are well opacified. No evidence of significant central pulmonary embolus. Can't exclude smaller peripheral emboli. Normal heart size. No pericardial effusion. Normal caliber thoracic aorta. No aortic dissection. Great vessel origins are patent. Mediastinum/Nodes: No enlarged mediastinal, hilar, or  axillary lymph nodes. Thyroid gland, trachea, and esophagus demonstrate no significant findings. Lungs/Pleura: Evaluation of lungs is limited due to respiratory motion artifact. There is no gross consolidation or airspace disease. No pleural effusions. No pneumothorax. Upper Abdomen: No acute abnormality. Musculoskeletal: No chest wall abnormality. No acute or significant osseous findings. Review of the MIP images confirms the above findings. IMPRESSION: 1. No evidence of significant pulmonary embolus. 2. No evidence of active pulmonary disease. Electronically Signed   By: Lucienne Capers M.D.   On: 01/27/2017 01:38        Scheduled Meds: . albuterol  2.5 mg Nebulization TID  . amLODipine  10 mg Oral Daily  . arformoterol  15 mcg Nebulization BID  . azithromycin  250 mg Oral Daily  . budesonide  0.25 mg Nebulization BID  . Chlorhexidine Gluconate Cloth  6 each Topical Q0600  . enoxaparin (LOVENOX) injection  40 mg Subcutaneous Q24H  . fluticasone   2 spray Each Nare Daily  . furosemide  40 mg Oral Daily  . guaiFENesin  600 mg Oral BID  . loratadine  10 mg Oral Daily  . losartan  100 mg Oral Daily  . metoprolol tartrate  25 mg Oral BID  . montelukast  10 mg Oral QHS  . mupirocin ointment  1 application Nasal BID  . pantoprazole  40 mg Oral Daily  . predniSONE  40 mg Oral BID WC  . sertraline  100 mg Oral Daily  . spironolactone  100 mg Oral Daily  . tiotropium  18 mcg Inhalation Daily   Continuous Infusions:   LOS: 1 day    Time spent: 25 min    [place], DO [facility] Pager [phone]  If 7PM-7AM, please contact night-coverage [email or web address] Password [email or web address] 01/28/2017, 2:29 PM

## 2017-01-28 NOTE — Telephone Encounter (Signed)
Call placed to the patient to inquire if she wants to reschedule her appointment at [facility] for tomorrow - 01/29/17 @ 1400.  She said that is waiting to speak to the doctor and may be leaving today but would prefer to stay until tomorrow. She noted that she does not want to reschedule her appointment and plans to be at the clinic tomorrow afternoon.

## 2017-01-28 NOTE — Progress Notes (Signed)
Walking O2 100%.. Pt walked with NT to nuring station and back to room. O2 stayed at 100%

## 2017-01-29 ENCOUNTER — Inpatient Hospital Stay: Payer: Medicaid Other | Admitting: Family Medicine

## 2017-01-29 ENCOUNTER — Inpatient Hospital Stay (HOSPITAL_COMMUNITY): Payer: Medicaid Other

## 2017-01-29 DIAGNOSIS — J4551 Severe persistent asthma with (acute) exacerbation: Principal | ICD-10-CM

## 2017-01-29 DIAGNOSIS — J45901 Unspecified asthma with (acute) exacerbation: Secondary | ICD-10-CM

## 2017-01-29 DIAGNOSIS — R0602 Shortness of breath: Secondary | ICD-10-CM

## 2017-01-29 DIAGNOSIS — E876 Hypokalemia: Secondary | ICD-10-CM

## 2017-01-29 DIAGNOSIS — D72829 Elevated white blood cell count, unspecified: Secondary | ICD-10-CM

## 2017-01-29 DIAGNOSIS — Z6841 Body Mass Index (BMI) 40.0 and over, adult: Secondary | ICD-10-CM

## 2017-01-29 LAB — CBC
HCT: 37.2 % (ref 36.0–46.0)
HEMOGLOBIN: 11.5 g/dL — AB (ref 12.0–15.0)
MCH: 27.6 pg (ref 26.0–34.0)
MCHC: 30.9 g/dL (ref 30.0–36.0)
MCV: 89.2 fL (ref 78.0–100.0)
Platelets: 388 10*3/uL (ref 150–400)
RBC: 4.17 MIL/uL (ref 3.87–5.11)
RDW: 19.3 % — AB (ref 11.5–15.5)
WBC: 23.7 10*3/uL — ABNORMAL HIGH (ref 4.0–10.5)

## 2017-01-29 LAB — BASIC METABOLIC PANEL
Anion gap: 9 (ref 5–15)
BUN: 7 mg/dL (ref 6–20)
CALCIUM: 8 mg/dL — AB (ref 8.9–10.3)
CO2: 25 mmol/L (ref 22–32)
Chloride: 104 mmol/L (ref 101–111)
Creatinine, Ser: 0.65 mg/dL (ref 0.44–1.00)
GFR calc Af Amer: >60 mL/min (ref >60)
Glucose, Bld: 111 mg/dL — ABNORMAL HIGH (ref 65–99)
Potassium: 3.7 mmol/L (ref 3.5–5.1)
Sodium: 138 mmol/L (ref 135–145)

## 2017-01-29 MED ORDER — IPRATROPIUM-ALBUTEROL 0.5-2.5 (3) MG/3ML IN SOLN
3.0000 mL | Freq: Four times a day (QID) | RESPIRATORY_TRACT | Status: DC
  Administered 2017-01-29 – 2017-02-01 (×11): 3 mL via RESPIRATORY_TRACT
  Filled 2017-01-29 (×12): qty 3

## 2017-01-29 MED ORDER — ALBUTEROL SULFATE (2.5 MG/3ML) 0.083% IN NEBU: 2.5000 mg | INHALATION_SOLUTION | RESPIRATORY_TRACT | Status: DC | PRN

## 2017-01-29 MED ORDER — BUDESONIDE 0.5 MG/2ML IN SUSP
0.5000 mg | Freq: Two times a day (BID) | RESPIRATORY_TRACT | Status: DC
  Administered 2017-01-29 – 2017-02-01 (×6): 0.5 mg via RESPIRATORY_TRACT
  Filled 2017-01-29 (×6): qty 2

## 2017-01-29 MED ORDER — ENOXAPARIN SODIUM 80 MG/0.8ML ~~LOC~~ SOLN
75.0000 mg | SUBCUTANEOUS | Status: DC
  Administered 2017-01-29 – 2017-01-31 (×3): 75 mg via SUBCUTANEOUS
  Filled 2017-01-29 (×3): qty 0.8

## 2017-01-29 MED ORDER — ARFORMOTEROL TARTRATE 15 MCG/2ML IN NEBU
15.0000 ug | INHALATION_SOLUTION | Freq: Two times a day (BID) | RESPIRATORY_TRACT | Status: DC
  Administered 2017-01-29 – 2017-02-01 (×6): 15 ug via RESPIRATORY_TRACT
  Filled 2017-01-29 (×5): qty 2

## 2017-01-29 MED ORDER — METHYLPREDNISOLONE SODIUM SUCC 125 MG IJ SOLR
60.0000 mg | Freq: Two times a day (BID) | INTRAMUSCULAR | Status: DC
  Administered 2017-01-29 – 2017-01-31 (×4): 60 mg via INTRAVENOUS
  Filled 2017-01-29 (×4): qty 2

## 2017-01-29 NOTE — Progress Notes (Signed)
Oxygen 92-100% on room air during ambulation. Notably rhonchi throughout lungs with expiratory wheezing. Patient obviously short of breath and needing to slow down before getting back to her room. Patient requesting breathing treatment once walk was over. Dr. Eliseo Squires text paged with update.

## 2017-01-29 NOTE — Hospital Discharge Follow-Up (Signed)
Met with the patient today. Her appointment with the [facility] has been rescheduled for 02/05/17 @ 1400 and the information has been placed on the AVS. She noted that she has an appointment with bariatric medicine tomorrow.  She spoke about her anxiety regarding going home and then having to return to the hospital if she is wheezing and having difficulty breathing. She is worried that she is not medically ready to be discharged today.   Discussed her living situation  She receives 80 hours of PCS/month and has intermittent help from her boyfriend.   Update provided to Whitman Hero, RN CM

## 2017-01-29 NOTE — Consult Note (Signed)
Name: April [name] MRN: [id number] DOB: 30-Oct-1970    ADMISSION DATE:  01/26/2017 CONSULTATION DATE:  01/26/2017  REFERRING MD :  April [name]  CHIEF COMPLAINT:  SOB  HISTORY OF PRESENT ILLNESS:  46 year old female with PMH as below, which is significant for severe persistent asthma, OSA on CPAP, HTN, and morbid obesity. She is followed by April [name] in the pulmonary clinic, where she is treated with Dulera, budesonide, and PRN albuterol.  April [name] is listed as a home med, but she does not report taking this. She was on Xolair for a time, but she did not feel as though she gained much benefit and missed several treatments. She reports compliance with Dulera, albuterol, budesonide, and CPAP.   10/1 she was admitted with dyspnea x 1 week, progressive over the prior few days. She had wheeze on exam and was markedly dyspneic. She was admitted to the hospitalist team for asthma exacerbation and treated with IV steroids, Nebulized budesonide/brovana, and PO azithromycin. Despite these therapies she was slow to progress and pulmonary was asked for further evaluation.   SIGNIFICANT EVENTS    STUDIES:  HST 07/06/16- AHI 11/hour, desaturation to 74%. Spirometry 07/29/2016-moderately severe obstructive airways disease. FVC 2.21/68%, FEV1 1.53/57%, ratio 0.69, FEF 25-75% 0.98/34% CT angio 10/3 > No evidence of significant pulmonary embolus. No evidence of active pulmonary disease.   PAST MEDICAL HISTORY :   has a past medical history of Acanthosis nigricans; Arthritis; Asthma; COPD (chronic obstructive pulmonary disease) ([place]); Depression; GERD (gastroesophageal reflux disease); Headache(784.0); Helicobacter pylori (H. pylori) infection; Hypertension, essential; Insomnia; Menorrhagia; Morbid obesity ([place]); Obesity; Seasonal allergies; Shortness of breath; Sleep apnea; and Tobacco user.  has a past surgical history that includes Tubal ligation (1996) and Breast reduction surgery (09/2011). Prior to Admission  medications   Medication Sig Start Date End Date Taking? Authorizing Provider  albuterol (PROVENTIL HFA;VENTOLIN HFA) 108 (90 Base) MCG/ACT inhaler Inhale 2 puffs into the lungs every 4 (four) hours as needed for wheezing or shortness of breath. 01/23/17  Yes April Circle, PA-[name]  amLODipine (NORVASC) 10 MG tablet Take 1 tablet (10 mg total) by mouth daily. 12/13/16  Yes Rai, Ripudeep K, MD  arformoterol (BROVANA) 15 MCG/2ML NEBU Take 2 mLs (15 mcg total) by nebulization 2 (two) times daily. 01/02/17  Yes April Pour, Christean Grief, MD  azithromycin (ZITHROMAX) 250 MG tablet Take 1 tablet (250 mg total) by mouth daily. Take first 2 tablets together, then 1 every day until finished. 01/23/17  Yes April Circle, PA-[name]  budesonide (PULMICORT) 0.25 MG/2ML nebulizer solution Take 2 mLs (0.25 mg total) by nebulization 2 (two) times daily. 01/02/17  Yes April Pour, Christean Grief, MD  butalbital-acetaminophen-caffeine (FIORICET, ESGIC) [phone] MG tablet Take 1-2 tablets by mouth every 6 (six) hours as needed for headache. 12/19/16 12/19/17 Yes Joy, Shawn C, PA-[name]  fluticasone (FLONASE) 50 MCG/ACT nasal spray Place 2 sprays into both nostrils daily. 12/13/16  Yes Rai, Ripudeep K, MD  furosemide (LASIX) 40 MG tablet Take 1 tablet (40 mg total) by mouth daily. 01/17/17 01/17/18 Yes Short, Noah Delaine, MD  guaiFENesin (MUCINEX) 600 MG 12 hr tablet Take 1 tablet (600 mg total) by mouth 2 (two) times daily. 12/13/16  Yes Rai, Ripudeep K, MD  loratadine (CLARITIN) 10 MG tablet Take 1 tablet (10 mg total) by mouth daily. 08/30/15  Yes [name], MD  losartan (COZAAR) 100 MG tablet Take 1 tablet (100 mg total) by mouth daily. 12/13/16  Yes Rai, Vernelle Emerald, MD  metoprolol tartrate (  LOPRESSOR) 25 MG tablet Take 1 tablet (25 mg total) by mouth 2 (two) times daily. 12/27/16  Yes Amao, Charlane Ferretti, MD  mometasone-formoterol (DULERA) 200-5 MCG/ACT AERO Inhale 2 puffs into the lungs 2 (two) times daily. 01/06/17  Yes Young, Tarri Fuller D, MD  montelukast  (SINGULAIR) 10 MG tablet Take 1 tablet (10 mg total) by mouth at bedtime. 01/02/17  Yes April Pour, Christean Grief, MD  nicotine (NICODERM CQ - DOSED IN MG/24 HOURS) 14 mg/24hr patch Place 1 patch (14 mg total) onto the skin daily. Patient taking differently: Place 14 mg onto the skin daily as needed (smoking cessation).  10/04/16  Yes April Dumas, MD  omeprazole (PRILOSEC) 20 MG capsule Take 1 capsule (20 mg total) by mouth daily. Patient taking differently: Take 20 mg by mouth 2 (two) times daily.  10/09/16  Yes Young, Tarri Fuller D, MD  predniSONE (DELTASONE) 20 MG tablet Take 2 tablets (40 mg total) by mouth daily. 01/23/17  Yes April Circle, PA-[name]  sertraline (ZOLOFT) 100 MG tablet Take 1 tablet (100 mg total) by mouth daily. 12/27/16  Yes April Morale, MD  spironolactone (ALDACTONE) 100 MG tablet Take 1 tablet (100 mg total) by mouth daily. 01/17/17  Yes Short, Noah Delaine, MD  zolpidem (AMBIEN) 5 MG tablet Take 1 tablet (5 mg total) by mouth at bedtime as needed for sleep. 12/22/16  Yes Mikhail, Velta Addison, DO  predniSONE (DELTASONE) 20 MG tablet Take 2 tablets (40 mg total) by mouth daily with breakfast. 01/29/17 02/03/17  April Girt, DO   No Known Allergies  FAMILY HISTORY:  family history includes Asthma in her daughter and maternal grandmother; Cancer in her paternal aunt; Hypertension in her mother. SOCIAL HISTORY:  reports that she quit smoking about 2 years ago. Her smoking use included Cigarettes. She has a 9.00 pack-year smoking history. She has never used smokeless tobacco. She reports that she does not drink alcohol or use drugs.  REVIEW OF SYSTEMS:   Bolds are positive  Constitutional: weight loss, gain, night sweats, Fevers, chills, fatigue .  HEENT: headaches, Sore throat, sneezing, nasal congestion, post nasal drip, Difficulty swallowing, Tooth/dental problems, visual complaints visual changes, ear ache CV:  chest pain, radiates:,Orthopnea, PND, swelling in lower extremities, dizziness,  palpitations, syncope.  GI  heartburn, indigestion, abdominal pain, nausea, vomiting, diarrhea, change in bowel habits, loss of appetite, bloody stools.  Resp: cough, productive:minimally productive of yellow sputum., hemoptysis, dyspnea, chest pain, pleuritic.  Skin: rash or itching or icterus GU: dysuria, change in color of urine, urgency or frequency. flank pain, hematuria  MS: joint pain or swelling. decreased range of motion  Psych: change in mood or affect. depression or anxiety.  Neuro: difficulty with speech, weakness, numbness, ataxia    SUBJECTIVE:   VITAL SIGNS: Temp:  [98.5 F (36.9 [name])] 98.5 F (36.9 [name]) (10/03 0545) Pulse Rate:  [66-96] 66 (10/03 0545) Resp:  [18-20] 20 (10/03 0545) BP: (143-152)/(88-95) 152/88 (10/03 0545) SpO2:  [94 %-100 %] 96 % (10/03 1429)  PHYSICAL EXAMINATION: General:  Morbidly obese adult female in mild respiratory distress Neuro:  Alert, oriented, non-focal HEENT:  /AT, PERRL, no appreciable JVD Cardiovascular:  RRR, no MRG Lungs:  Coarse wheeze throughout. Abdomen:  Soft, non-tender, non-distneded Musculoskeletal:  No acute deformity or ROM limitation Skin:  Grossly intact   Recent Labs Lab 01/26/17 2240 01/27/17 1112 01/29/17 0638  NA 134* 136 138  [name] 2.9* 3.7 3.7  CL 102 102 104  CO2 25 22 25   BUN <5* <5* 7  CREATININE 0.65 0.80 0.65  GLUCOSE 120* 190* 111*    Recent Labs Lab 01/26/17 2014 01/27/17 1112 01/29/17 0638  HGB 12.4 11.2* 11.5*  HCT 39.0 35.5* 37.2  WBC 17.4* 15.2* 23.7*  PLT 245 361 388   No results found.  ASSESSMENT / PLAN:  Severe persistent asthma with acute exacerbation - Continue budesonide, Brovana. Seems like she as these at home, so should be able to simply continue at discharge. Increase budesonide dose.  - Hold dulera - Duoneb q 6 hours - escalate steroids to IV - Continue singulair, fluticasone nasal spray, and loratadine.  - Has missed Xolair treatments, April [name] considering IL 5  biologic, which she could take less frequently.  - Will need further outpatient workup with April [name].   Possible bronchitis - Agree PO azithromycin  OSA on CPAP - Will order CPAP QHS, she reports good compliance with this at home.   Georgann Housekeeper, AGACNP-BC [facility] Pager [phone] or [phone]  01/29/2017 3:28 PM   STAFF NOTE: Linwood Dibbles, MD FACP have personally reviewed patient's available data, including medical history, events of note, physical examination and test results as part of my evaluation. I have discussed with resident/NP and other care providers such as pharmacist, RN and RRT. In addition, I personally evaluated patient and elicited key findings of: awake, alert, No distress, obese neck, low heart sounds, active exp wheezing moderate in nature, mild exp wheexing in neck likley from chest, obese abdo, edema nonpitting mild, pcxr whci I reviewed did nOT show infiltrate, CT chest I reviewed did not show PE and no PNA, infiltrate, I am not impresse that this is infectious but she did not have a viral panel, I took a history and could not identify a specified cause in her house, may have had exposure to roaches, currently she is still having significant wheezing and would re start IV steroids, and switch her Bders back to nebs and off MDI, would re assess pcxr in am, maintain lasix to neg balance, re evaluate chem I am with lasix, it may be she has a component of VCD , unclear to me, I am more impressed that this is lung bronchospasm related, we also discussed at length her use of CPAP and followup Dr Annamaria [name] and escalation of mainteance asthma treatment  Lavon Paganini. Titus Mould, MD, [place] Pgr: [facility] 01/29/2017 4:12 PM

## 2017-01-29 NOTE — Progress Notes (Signed)
PROGRESS NOTE    April [name]  KXF:[id number] DOB: 1970-05-02 DOA: 01/26/2017 PCP: Arnoldo Morale, MD   Outpatient Specialists:     Brief Narrative:  April [name]  is a 46 y.o. female, w Asthma, OSA not on cpap, morbid obeisity, apparently c/o dyspnea for the past week that grew worse yesterday.  + wheezing.  Slight dry cough.  Denies fever, chills, cp, palp, wt gain, edema.   In ED, pt tx with albuterol neb, atrovent neb, solumedrol without relief.  CXR => no acute process CTA chest=> IMPRESSION: 1. No evidence of significant pulmonary embolus. 2. No evidence of active pulmonary disease.  Pt will be admitted for asthma exacerbation   Assessment & Plan:   Principal Problem:   Asthma exacerbation Active Problems:   Hypokalemia   Leukocytosis  Asthma exacerbation -wean to PO steroids -nebs -?not sure what regimen patient was on at home -not requiring O2 -suspect some de-conditioning contributing to continued SOB -will get PCCM consult-- 13 ER visits/admission in 6 months  Hypokalemia Repleted  Morbid obesity Body mass index is 56.85 kg/m.  Leukocytosis From steroids, afebrile  Hypertension Cont current medications  Chronic diastolic CHF (EF 69%) Cont metoprolol, lasix, losartan, spironolactone, amlodipine  Medical non-compliance -patient does not know what medications she is supposed to be on-- brovana vs pulmacort vs another--- does not appear to take BP medications at home either as BP elevated in ER but once medications given, BP normalizes -13 visits to ER/inpt care in last 6 months    DVT prophylaxis:  SCD's  Code Status: Full Code   Family Communication:   Disposition Plan:  Home   Consultants:   PCCM   Subjective: Able to walk w/o de-satting but nervous about going home  Objective: Vitals:   01/29/17 0807 01/29/17 0810 01/29/17 0814 01/29/17 1429  BP:      Pulse:      Resp:      Temp:      TempSrc:      SpO2: 94% 94%  94% 96%  Weight:      Height:        Intake/Output Summary (Last 24 hours) at 01/29/17 1558 Last data filed at 01/29/17 1300  Gross per 24 hour  Intake              500 ml  Output                0 ml  Net              500 ml   Filed Weights   01/26/17 1553 01/28/17 0610  Weight: (!) 145.2 kg (320 lb) (!) 154.9 kg (341 lb 9.6 oz)    Examination:  General exam: in bed, no increase work of breathing Respiratory system: few expiratory wheezed Cardiovascular system: rrr Gastrointestinal system: soft, obese Central nervous system: A+Ox3 Extremities: moves all 4 ext     Data Reviewed: I have personally reviewed following labs and imaging studies  CBC:  Recent Labs Lab 01/22/17 2035 01/26/17 2014 01/27/17 1112 01/29/17 0638  WBC 16.9* 17.4* 15.2* 23.7*  HGB 12.3 12.4 11.2* 11.5*  HCT 39.3 39.0 35.5* 37.2  MCV 88.5 88.6 87.4 89.2  PLT 306 245 361 678   Basic Metabolic Panel:  Recent Labs Lab 01/22/17 2035 01/26/17 2240 01/27/17 1112 01/29/17 0638  NA 135 134* 136 138  K 3.1* 2.9* 3.7 3.7  CL 101 102 102 104  CO2 28 25 22 25   GLUCOSE 107* 120*  190* 111*  BUN <5* <5* <5* 7  CREATININE 0.86 0.65 0.80 0.65  CALCIUM 8.1* 7.7* 8.1* 8.0*  MG  --  1.7  --   --    GFR: Estimated Creatinine Clearance: 136.3 mL/min (by C-G formula based on SCr of 0.65 mg/dL). Liver Function Tests:  Recent Labs Lab 01/27/17 1112  AST 26  ALT 18  ALKPHOS 62  BILITOT 0.2*  PROT 6.4*  ALBUMIN 3.1*   No results for input(s): LIPASE, AMYLASE in the last 168 hours. No results for input(s): AMMONIA in the last 168 hours. Coagulation Profile: No results for input(s): INR, PROTIME in the last 168 hours. Cardiac Enzymes: No results for input(s): CKTOTAL, CKMB, CKMBINDEX, TROPONINI in the last 168 hours. BNP (last 3 results) No results for input(s): PROBNP in the last 8760 hours. HbA1C: No results for input(s): HGBA1C in the last 72 hours. CBG:  Recent Labs Lab 01/26/17 2206   GLUCAP 100*   Lipid Profile: No results for input(s): CHOL, HDL, LDLCALC, TRIG, CHOLHDL, LDLDIRECT in the last 72 hours. Thyroid Function Tests: No results for input(s): TSH, T4TOTAL, FREET4, T3FREE, THYROIDAB in the last 72 hours. Anemia Panel: No results for input(s): VITAMINB12, FOLATE, FERRITIN, TIBC, IRON, RETICCTPCT in the last 72 hours. Urine analysis:    Component Value Date/Time   COLORURINE YELLOW 01/01/2017 0935   APPEARANCEUR HAZY (A) 01/01/2017 0935   LABSPEC 1.012 01/01/2017 0935   PHURINE 5.0 01/01/2017 0935   GLUCOSEU 50 (A) 01/01/2017 0935   GLUCOSEU NEG mg/dL 10/28/2007 2049   HGBUR NEGATIVE 01/01/2017 0935   BILIRUBINUR NEGATIVE 01/01/2017 0935   KETONESUR NEGATIVE 01/01/2017 0935   PROTEINUR NEGATIVE 01/01/2017 0935   UROBILINOGEN 1.0 11/21/2014 0707   NITRITE NEGATIVE 01/01/2017 0935   LEUKOCYTESUR NEGATIVE 01/01/2017 0935     ) Recent Results (from the past 240 hour(s))  MRSA PCR Screening     Status: Abnormal   Collection Time: 01/27/17  2:14 PM  Result Value Ref Range Status   MRSA by PCR POSITIVE (A) NEGATIVE Final    Comment:        The GeneXpert MRSA Assay (FDA approved for NASAL specimens only), is one component of a comprehensive MRSA colonization surveillance program. It is not intended to diagnose MRSA infection nor to guide or monitor treatment for MRSA infections. RESULT CALLED TO, READ BACK BY AND VERIFIED WITH: Micheline Rough RN 17:15 01/27/17 ([email or web address])       Anti-infectives    Start     Dose/Rate Route Frequency Ordered Stop   01/27/17 0430  azithromycin (ZITHROMAX) tablet 250 mg     250 mg Oral Daily 01/27/17 0345         Radiology Studies: No results found.      Scheduled Meds: . amLODipine  10 mg Oral Daily  . arformoterol  15 mcg Nebulization BID  . azithromycin  250 mg Oral Daily  . budesonide (PULMICORT) nebulizer solution  0.5 mg Nebulization BID  . Chlorhexidine Gluconate Cloth  6 each Topical Q0600  .  enoxaparin (LOVENOX) injection  75 mg Subcutaneous Q24H  . fluticasone  2 spray Each Nare Daily  . furosemide  40 mg Oral Daily  . guaiFENesin  600 mg Oral BID  . ipratropium-albuterol  3 mL Nebulization Q6H  . loratadine  10 mg Oral Daily  . losartan  100 mg Oral Daily  . metoprolol tartrate  25 mg Oral BID  . montelukast  10 mg Oral QHS  . mupirocin ointment  1  application Nasal BID  . nicotine  14 mg Transdermal QHS  . pantoprazole  40 mg Oral Daily  . polyethylene glycol  17 g Oral Daily  . predniSONE  40 mg Oral BID WC  . sertraline  100 mg Oral Daily  . spironolactone  100 mg Oral Daily   Continuous Infusions:   LOS: 2 days    Time spent: 25 min    [place], DO [facility] Pager [phone]  If 7PM-7AM, please contact night-coverage [email or web address] Password [email or web address] 01/29/2017, 3:58 PM

## 2017-01-30 DIAGNOSIS — J4521 Mild intermittent asthma with (acute) exacerbation: Secondary | ICD-10-CM

## 2017-01-30 NOTE — Progress Notes (Signed)
PROGRESS NOTE    CHLORA MCBAIN  DTO:[id number] DOB: 1970/07/27 DOA: 01/26/2017 PCP: Arnoldo Morale, MD   Outpatient Specialists:     Brief Narrative:  April [name]  is a 46 y.o. female, w Asthma, OSA not on cpap, morbid obeisity, apparently c/o dyspnea for the past week that grew worse yesterday.  + wheezing.  Slight dry cough.  Denies fever, chills, cp, palp, wt gain, edema.   In ED, pt tx with albuterol neb, atrovent neb, solumedrol without relief.  CXR => no acute process CTA chest=> IMPRESSION: 1. No evidence of significant pulmonary embolus. 2. No evidence of active pulmonary disease.  Pt will be admitted for asthma exacerbation   Assessment & Plan:   Principal Problem:   Asthma exacerbation Active Problems:   Hypokalemia   Leukocytosis  Asthma exacerbation -placed back on IV steroids by PCCM-- defer management to them -nebs -?not sure what regimen patient was on at home -not requiring O2 -suspect some de-conditioning contributing to continued SOB -13 ER visits/admission in 6 months -per PCCM: Continue Brovana + Budesonide > she should continue both at discharge  Hypokalemia Repleted  Morbid obesity Body mass index is 57.23 kg/m.  Leukocytosis From steroids, afebrile  Hypertension Cont current medications  Chronic diastolic CHF (EF 98%) Cont metoprolol, lasix, losartan, spironolactone, amlodipine -weight down  Medical non-compliance -patient does not know what medications she is supposed to be on-- brovana vs pulmacort vs another--- does not appear to take BP medications at home either as BP elevated in ER but once medications given, BP normalizes -13 visits to ER/inpt care in last 6 months    DVT prophylaxis:  SCD's  Code Status: Full Code   Family Communication:   Disposition Plan:  Home when ok with PCCM   Consultants:   PCCM   Subjective: Feeling better-- asking about going home  Objective: Vitals:   01/30/17 0850  01/30/17 0851 01/30/17 1440 01/30/17 1451  BP:    (!) 145/81  Pulse:    76  Resp:    20  Temp:    97.7 F (36.5 C)  TempSrc:    Oral  SpO2: 96% 96% 96% 97%  Weight:      Height:        Intake/Output Summary (Last 24 hours) at 01/30/17 1625 Last data filed at 01/30/17 1224  Gross per 24 hour  Intake              120 ml  Output                0 ml  Net              120 ml   Filed Weights   01/26/17 1553 01/28/17 0610 01/30/17 0529  Weight: (!) 145.2 kg (320 lb) (!) 154.9 kg (341 lb 9.6 oz) (!) 156 kg (343 lb 14.7 oz)    Examination:  General exam: just out of shower-- had SOB with exertion Respiratory system: few expiratory wheezes Cardiovascular system: rrr Gastrointestinal system: +BS, obese Central nervous system: alert, NAD Extremities: LE edema     Data Reviewed: I have personally reviewed following labs and imaging studies  CBC:  Recent Labs Lab 01/26/17 2014 01/27/17 1112 01/29/17 0638  WBC 17.4* 15.2* 23.7*  HGB 12.4 11.2* 11.5*  HCT 39.0 35.5* 37.2  MCV 88.6 87.4 89.2  PLT 245 361 338   Basic Metabolic Panel:  Recent Labs Lab 01/26/17 2240 01/27/17 1112 01/29/17 0638  NA 134* 136 138  K 2.9* 3.7 3.7  CL 102 102 104  CO2 25 22 25   GLUCOSE 120* 190* 111*  BUN <5* <5* 7  CREATININE 0.65 0.80 0.65  CALCIUM 7.7* 8.1* 8.0*  MG 1.7  --   --    GFR: Estimated Creatinine Clearance: 136.9 mL/min (by C-G formula based on SCr of 0.65 mg/dL). Liver Function Tests:  Recent Labs Lab 01/27/17 1112  AST 26  ALT 18  ALKPHOS 62  BILITOT 0.2*  PROT 6.4*  ALBUMIN 3.1*   No results for input(s): LIPASE, AMYLASE in the last 168 hours. No results for input(s): AMMONIA in the last 168 hours. Coagulation Profile: No results for input(s): INR, PROTIME in the last 168 hours. Cardiac Enzymes: No results for input(s): CKTOTAL, CKMB, CKMBINDEX, TROPONINI in the last 168 hours. BNP (last 3 results) No results for input(s): PROBNP in the last 8760  hours. HbA1C: No results for input(s): HGBA1C in the last 72 hours. CBG:  Recent Labs Lab 01/26/17 2206  GLUCAP 100*   Lipid Profile: No results for input(s): CHOL, HDL, LDLCALC, TRIG, CHOLHDL, LDLDIRECT in the last 72 hours. Thyroid Function Tests: No results for input(s): TSH, T4TOTAL, FREET4, T3FREE, THYROIDAB in the last 72 hours. Anemia Panel: No results for input(s): VITAMINB12, FOLATE, FERRITIN, TIBC, IRON, RETICCTPCT in the last 72 hours. Urine analysis:    Component Value Date/Time   COLORURINE YELLOW 01/01/2017 0935   APPEARANCEUR HAZY (A) 01/01/2017 0935   LABSPEC 1.012 01/01/2017 0935   PHURINE 5.0 01/01/2017 0935   GLUCOSEU 50 (A) 01/01/2017 0935   GLUCOSEU NEG mg/dL 10/28/2007 2049   HGBUR NEGATIVE 01/01/2017 0935   BILIRUBINUR NEGATIVE 01/01/2017 0935   KETONESUR NEGATIVE 01/01/2017 0935   PROTEINUR NEGATIVE 01/01/2017 0935   UROBILINOGEN 1.0 11/21/2014 0707   NITRITE NEGATIVE 01/01/2017 0935   LEUKOCYTESUR NEGATIVE 01/01/2017 0935     ) Recent Results (from the past 240 hour(s))  MRSA PCR Screening     Status: Abnormal   Collection Time: 01/27/17  2:14 PM  Result Value Ref Range Status   MRSA by PCR POSITIVE (A) NEGATIVE Final    Comment:        The GeneXpert MRSA Assay (FDA approved for NASAL specimens only), is one component of a comprehensive MRSA colonization surveillance program. It is not intended to diagnose MRSA infection nor to guide or monitor treatment for MRSA infections. RESULT CALLED TO, READ BACK BY AND VERIFIED WITH: Micheline Rough RN 17:15 01/27/17 ([email or web address])       Anti-infectives    Start     Dose/Rate Route Frequency Ordered Stop   01/27/17 0430  azithromycin (ZITHROMAX) tablet 250 mg     250 mg Oral Daily 01/27/17 0345         Radiology Studies: Dg Chest Port 1 View  Result Date: 01/29/2017 CLINICAL DATA:  Shortness of breath. EXAM: PORTABLE CHEST 1 VIEW COMPARISON:  Chest x-ray dated January 26, 2017. FINDINGS: The  cardiomediastinal silhouette is normal in size given technique. Normal pulmonary vascularity. No focal consolidation, pleural effusion, or pneumothorax. No acute osseous abnormality. IMPRESSION: No active disease. Electronically Signed   By: Titus Dubin M.D.   On: 01/29/2017 16:16        Scheduled Meds: . amLODipine  10 mg Oral Daily  . arformoterol  15 mcg Nebulization BID  . azithromycin  250 mg Oral Daily  . budesonide (PULMICORT) nebulizer solution  0.5 mg Nebulization BID  . Chlorhexidine Gluconate Cloth  6 each Topical Q0600  .  enoxaparin (LOVENOX) injection  75 mg Subcutaneous Q24H  . fluticasone  2 spray Each Nare Daily  . furosemide  40 mg Oral Daily  . guaiFENesin  600 mg Oral BID  . ipratropium-albuterol  3 mL Nebulization Q6H  . loratadine  10 mg Oral Daily  . losartan  100 mg Oral Daily  . methylPREDNISolone (SOLU-MEDROL) injection  60 mg Intravenous Q12H  . metoprolol tartrate  25 mg Oral BID  . montelukast  10 mg Oral QHS  . mupirocin ointment  1 application Nasal BID  . nicotine  14 mg Transdermal QHS  . pantoprazole  40 mg Oral Daily  . polyethylene glycol  17 g Oral Daily  . sertraline  100 mg Oral Daily  . spironolactone  100 mg Oral Daily   Continuous Infusions:   LOS: 3 days    Time spent: 25 min    [place], DO [facility] Pager [phone]  If 7PM-7AM, please contact night-coverage [email or web address] Password [email or web address] 01/30/2017, 4:25 PM

## 2017-01-30 NOTE — Telephone Encounter (Signed)
I was able to get in touch with pt. Today. She is currently in the hosp.. She is going to come by when she is able and fill out the PAN form. Nothing further needed at this time. Closing encounter.

## 2017-01-30 NOTE — Progress Notes (Signed)
Name: April [name] MRN: [id number] DOB: [birth year]    ADMISSION DATE:  01/26/2017 CONSULTATION DATE:  01/26/2017  REFERRING MD :  Dr. Eliseo Squires  CHIEF COMPLAINT:  SOB  BRIEF SUMMARY:  46 year old female with PMH of severe persistent asthma, OSA on CPAP, HTN, and morbid obesity. She is followed by Dr. Annamaria Boots in the pulmonary clinic, where she is treated with Dulera, budesonide, and PRN albuterol.  [name] is listed as a home med, but she does not report taking this. She was on Xolair for a time, but she did not feel as though she gained much benefit and missed several treatments. She reports compliance with Dulera, albuterol, budesonide, and CPAP.   10/1 she was admitted with dyspnea x 1 week, progressive over the prior few days. She had wheeze on exam and was markedly dyspneic. She was admitted to the hospitalist team for asthma exacerbation and treated with IV steroids, Nebulized budesonide/brovana, and PO azithromycin. Despite these therapies she was slow to progress and pulmonary was asked for further evaluation.    SUBJECTIVE:  Pt reports feeling "some better".  Wore her CPAP last night.  States she only intermittently wears it at home.  Feels her breathing is about the same.  Afebrile.    VITAL SIGNS: Temp:  [98.1 F (36.7 C)-98.6 F (37 C)] 98.6 F (37 C) (10/04 0529) Pulse Rate:  [73-88] 73 (10/04 0529) Resp:  [18-20] 18 (10/04 0529) BP: (130-169)/(62-82) 137/68 (10/04 0529) SpO2:  [96 %-100 %] 96 % (10/04 0851) Weight:  [343 lb 14.7 oz (156 kg)] 343 lb 14.7 oz (156 kg) (10/04 0529)  PHYSICAL EXAMINATION: General:  Morbidly obese female in NAD, sitting in bed HEENT: MM pink/moist, unable to appreciate JVD PSY: calm/appropriate  Neuro: AAOx4, speech clear  CV: s1s2 rrr, no m/r/g PULM: even/non-labored, lungs bilaterally with exp wheezing  ME:QAST, non-tender, bsx4 active  Extremities: warm/dry, no edema  Skin: no rashes or lesions   Recent Labs Lab 01/26/17 2240  01/27/17 1112 01/29/17 0638  NA 134* 136 138  K 2.9* 3.7 3.7  CL 102 102 104  CO2 25 22 25   BUN <5* <5* 7  CREATININE 0.65 0.80 0.65  GLUCOSE 120* 190* 111*    Recent Labs Lab 01/26/17 2014 01/27/17 1112 01/29/17 0638  HGB 12.4 11.2* 11.5*  HCT 39.0 35.5* 37.2  WBC 17.4* 15.2* 23.7*  PLT 245 361 388   Dg Chest Port 1 View  Result Date: 01/29/2017 CLINICAL DATA:  Shortness of breath. EXAM: PORTABLE CHEST 1 VIEW COMPARISON:  Chest x-ray dated January 26, 2017. FINDINGS: The cardiomediastinal silhouette is normal in size given technique. Normal pulmonary vascularity. No focal consolidation, pleural effusion, or pneumothorax. No acute osseous abnormality. IMPRESSION: No active disease. Electronically Signed   By: Titus Dubin M.D.   On: 01/29/2017 16:16   SIGNIFICANT EVENTS  9/30  Admit with acute asthma exacerbation   STUDIES:  HST 07/06/16 >> AHI 11/hour, desaturation to 74%. Spirometry 07/29/2016 >>  moderately severe obstructive airways disease. FVC 2.21/68%, FEV1 1.53/57%, ratio 0.69, FEF 25-75% 0.98/34% CT angio 10/3 >> No evidence of significant pulmonary embolus. No evidence of active pulmonary disease.   ASSESSMENT / PLAN:  Severe persistent asthma with acute exacerbation P: Continue Brovana + Budesonide > she should continue both at discharge Solumedrol 60 mg IV Q12 Hold home Dulera  Continue singulair, fluticasone, loratadine  Dr. Annamaria Boots reviewing for IL5 biologic > has missed Xolair Will need pulmonary follow up at discharge (arranged)  Possible bronchitis P: Continue azithro, D4/5   OSA on CPAP P: CPAP QHS & PRN daytime sleeping  Reviewed importance of compliance with patient  Follow up with Dr. Annamaria Boots at discharge     [name], NP-C [place] Pgr: [phone] or if no answer [phone] 01/30/2017, 10:26 AM

## 2017-01-31 DIAGNOSIS — J4521 Mild intermittent asthma with (acute) exacerbation: Secondary | ICD-10-CM

## 2017-01-31 MED ORDER — NICOTINE 7 MG/24HR TD PT24
7.0000 mg | MEDICATED_PATCH | Freq: Every day | TRANSDERMAL | Status: DC
  Administered 2017-01-31: 7 mg via TRANSDERMAL
  Filled 2017-01-31: qty 1

## 2017-01-31 MED ORDER — PREDNISONE 20 MG PO TABS
40.0000 mg | ORAL_TABLET | Freq: Two times a day (BID) | ORAL | Status: DC
  Administered 2017-01-31 – 2017-02-01 (×2): 40 mg via ORAL
  Filled 2017-01-31 (×2): qty 2

## 2017-01-31 NOTE — Progress Notes (Signed)
PROGRESS NOTE [facility]   April [name]   KDX:[id number] DOB: 1970/07/19  DOA: 01/26/2017 PCP: Arnoldo Morale, MD   Brief Narrative:  DawnBeardis a 46 y.o.female,w Asthma, OSA not on cpap, morbid obeisity, apparently c/o dyspnea for the past week that grew worse yesterday. + wheezing. Slight dry cough. Denies fever, chills, cp, palp, wt gain, edema.   In ED, pt tx with albuterol neb, atrovent neb, solumedrol without relief.  CXR => no acute process CTA chest=> IMPRESSION: 1. No evidence of significant pulmonary embolus. 2. No evidence of active pulmonary disease.  Pt will be admitted for asthma exacerbation  Subjective: Breathing continues to improve, although still audible wheezing. Denies chest pain. Afebrile.   Assessment & Plan: Asthma exacerbation -On Solumedrol - switch to Prednisone on 10/5  -Continue nebs  -Pulmonary recommendations appreciated  -On room air - not required O2  -suspect some de-conditioning contributing to continued SOB  Hypokalemia - resolved   Morbid obesity Body mass index is 56.85 kg/m.  Leukocytosis Steroids induce - afebrile   Hypertension BP slight above goal  Continue current medication - if continues to be elevated, will adjust medication  Monitor BP  Chronic diastolic CHF (EF 97%) Cont metoprolol, lasix, losartan, spironolactone, amlodipine  Non compliant with medication Patient has been followed by [facility] and [facility] pulmonary, discussed importance of medication adherence.   DVT prophylaxis: Lovenox Code Status: Full  Family Communication: None at bedside  Disposition Plan: Home in next 1-2 days   Consultants:   PCCM   Procedures:   None   Antimicrobials: Anti-infectives    Start     Dose/Rate Route Frequency Ordered Stop   01/27/17 0430  azithromycin (ZITHROMAX) tablet 250 mg     250 mg Oral Daily 01/27/17 0345         Objective: Vitals:   01/31/17 0931 01/31/17 0947 01/31/17 1200 01/31/17  1451  BP: (!) 171/94  (!) 155/87   Pulse: 73  67   Resp:      Temp:      TempSrc:      SpO2:  100%  98%  Weight:      Height:        Intake/Output Summary (Last 24 hours) at 01/31/17 1520 Last data filed at 01/31/17 0900  Gross per 24 hour  Intake              240 ml  Output                0 ml  Net              240 ml   Filed Weights   01/28/17 0610 01/30/17 0529 01/31/17 0424  Weight: (!) 154.9 kg (341 lb 9.6 oz) (!) 156 kg (343 lb 14.7 oz) (!) 155.7 kg (343 lb 4.8 oz)    Examination:  General exam: Appears calm and comfortable  Respiratory system: Decrease air entry b/l, diffuse b/l ins and exp wheezing  Cardiovascular system: S1 & S2 heard, RRR. No JVD, murmurs, rubs or gallops Gastrointestinal system: Obese NT, ND, soft  Central nervous system: Alert and oriented.  Extremities: No pedal edema. Skin: No rashes, lesions or ulcers Psychiatry: Judgement and insight appear normal. Mood & affect appropriate.    Data Reviewed: I have personally reviewed following labs and imaging studies  CBC:  Recent Labs Lab 01/26/17 2014 01/27/17 1112 01/29/17 0638  WBC 17.4* 15.2* 23.7*  HGB 12.4 11.2* 11.5*  HCT 39.0 35.5* 37.2  MCV 88.6  87.4 89.2  PLT 245 361 517   Basic Metabolic Panel:  Recent Labs Lab 01/26/17 2240 01/27/17 1112 01/29/17 0638  NA 134* 136 138  K 2.9* 3.7 3.7  CL 102 102 104  CO2 25 22 25   GLUCOSE 120* 190* 111*  BUN <5* <5* 7  CREATININE 0.65 0.80 0.65  CALCIUM 7.7* 8.1* 8.0*  MG 1.7  --   --    GFR: Estimated Creatinine Clearance: 136.7 mL/min (by C-G formula based on SCr of 0.65 mg/dL). Liver Function Tests:  Recent Labs Lab 01/27/17 1112  AST 26  ALT 18  ALKPHOS 62  BILITOT 0.2*  PROT 6.4*  ALBUMIN 3.1*   No results for input(s): LIPASE, AMYLASE in the last 168 hours. No results for input(s): AMMONIA in the last 168 hours. Coagulation Profile: No results for input(s): INR, PROTIME in the last 168 hours. Cardiac  Enzymes: No results for input(s): CKTOTAL, CKMB, CKMBINDEX, TROPONINI in the last 168 hours. BNP (last 3 results) No results for input(s): PROBNP in the last 8760 hours. HbA1C: No results for input(s): HGBA1C in the last 72 hours. CBG:  Recent Labs Lab 01/26/17 2206  GLUCAP 100*   Lipid Profile: No results for input(s): CHOL, HDL, LDLCALC, TRIG, CHOLHDL, LDLDIRECT in the last 72 hours. Thyroid Function Tests: No results for input(s): TSH, T4TOTAL, FREET4, T3FREE, THYROIDAB in the last 72 hours. Anemia Panel: No results for input(s): VITAMINB12, FOLATE, FERRITIN, TIBC, IRON, RETICCTPCT in the last 72 hours. Sepsis Labs: No results for input(s): PROCALCITON, LATICACIDVEN in the last 168 hours.  Recent Results (from the past 240 hour(s))  MRSA PCR Screening     Status: Abnormal   Collection Time: 01/27/17  2:14 PM  Result Value Ref Range Status   MRSA by PCR POSITIVE (A) NEGATIVE Final    Comment:        The GeneXpert MRSA Assay (FDA approved for NASAL specimens only), is one component of a comprehensive MRSA colonization surveillance program. It is not intended to diagnose MRSA infection nor to guide or monitor treatment for MRSA infections. RESULT CALLED TO, READ BACK BY AND VERIFIED WITH: Micheline Rough RN 17:15 01/27/17 ([email or web address])       Radiology Studies: Dg Chest Port 1 View  Result Date: 01/29/2017 CLINICAL DATA:  Shortness of breath. EXAM: PORTABLE CHEST 1 VIEW COMPARISON:  Chest x-ray dated January 26, 2017. FINDINGS: The cardiomediastinal silhouette is normal in size given technique. Normal pulmonary vascularity. No focal consolidation, pleural effusion, or pneumothorax. No acute osseous abnormality. IMPRESSION: No active disease. Electronically Signed   By: Titus Dubin M.D.   On: 01/29/2017 16:16      Scheduled Meds: . amLODipine  10 mg Oral Daily  . arformoterol  15 mcg Nebulization BID  . azithromycin  250 mg Oral Daily  . budesonide (PULMICORT)  nebulizer solution  0.5 mg Nebulization BID  . Chlorhexidine Gluconate Cloth  6 each Topical Q0600  . enoxaparin (LOVENOX) injection  75 mg Subcutaneous Q24H  . fluticasone  2 spray Each Nare Daily  . furosemide  40 mg Oral Daily  . guaiFENesin  600 mg Oral BID  . ipratropium-albuterol  3 mL Nebulization Q6H  . loratadine  10 mg Oral Daily  . losartan  100 mg Oral Daily  . metoprolol tartrate  25 mg Oral BID  . montelukast  10 mg Oral QHS  . mupirocin ointment  1 application Nasal BID  . nicotine  7 mg Transdermal QHS  . pantoprazole  40  mg Oral Daily  . polyethylene glycol  17 g Oral Daily  . predniSONE  40 mg Oral BID WC  . sertraline  100 mg Oral Daily  . spironolactone  100 mg Oral Daily   Continuous Infusions:   LOS: 4 days    Time spent: Total of 15 minutes spent with pt, greater than 50% of which was spent in discussion of  treatment, counseling and coordination of care    Chipper Oman, MD Pager: Text Page via [email or web address]   If 7PM-7AM, please contact night-coverage [email or web address] 01/31/2017, 3:20 PM

## 2017-01-31 NOTE — Progress Notes (Signed)
Name: April [name] MRN: [id number] DOB: Jul 12, 1970    ADMISSION DATE:  01/26/2017 CONSULTATION DATE:  01/26/2017  REFERRING MD :  Dr. Eliseo Squires  CHIEF COMPLAINT:  SOB  BRIEF SUMMARY:  46 year old female with PMH of severe persistent asthma, OSA on CPAP, HTN, and morbid obesity. She is followed by Dr. Annamaria Boots in the pulmonary clinic, where she is treated with Dulera, budesonide, and PRN albuterol.  [name] is listed as a home med, but she does not report taking this. She was on Xolair for a time, but she did not feel as though she gained much benefit and missed several treatments. She reports compliance with Dulera, albuterol, budesonide, and CPAP.   10/1 she was admitted with dyspnea x 1 week, progressive over the prior few days. She had wheeze on exam and was markedly dyspneic. She was admitted to the hospitalist team for asthma exacerbation and treated with IV steroids, Nebulized budesonide/brovana, and PO azithromycin. Despite these therapies she was slow to progress and pulmonary was asked for further evaluation.    SUBJECTIVE:  Continues to improve. Poorly compliant with cpap. Changed to prednisone   VITAL SIGNS: Temp:  [97.6 F (36.4 C)-98.1 F (36.7 C)] 97.6 F (36.4 C) (10/05 0424) Pulse Rate:  [67-76] 73 (10/05 0931) Resp:  [18-20] 18 (10/05 0424) BP: (145-172)/(81-100) 171/94 (10/05 0931) SpO2:  [96 %-100 %] 100 % (10/05 0947) Weight:  [343 lb 4.8 oz (155.7 kg)] 343 lb 4.8 oz (155.7 kg) (10/05 0424)  PHYSICAL EXAMINATION: General:  MO female in nad.  HEENT: no jvd/lan PSY:nl affect Neuro: intact CV: hsr rr PULM: decreased air movement. No wheeze EH:MCNO, non-tender, bsx4 active  Extremities: warm/dry, _ edema  Skin: no rashes or lesions   Recent Labs Lab 01/26/17 2240 01/27/17 1112 01/29/17 0638  NA 134* 136 138  K 2.9* 3.7 3.7  CL 102 102 104  CO2 25 22 25   BUN <5* <5* 7  CREATININE 0.65 0.80 0.65  GLUCOSE 120* 190* 111*    Recent Labs Lab 01/26/17 2014  01/27/17 1112 01/29/17 0638  HGB 12.4 11.2* 11.5*  HCT 39.0 35.5* 37.2  WBC 17.4* 15.2* 23.7*  PLT 245 361 388   Dg Chest Port 1 View  Result Date: 01/29/2017 CLINICAL DATA:  Shortness of breath. EXAM: PORTABLE CHEST 1 VIEW COMPARISON:  Chest x-ray dated January 26, 2017. FINDINGS: The cardiomediastinal silhouette is normal in size given technique. Normal pulmonary vascularity. No focal consolidation, pleural effusion, or pneumothorax. No acute osseous abnormality. IMPRESSION: No active disease. Electronically Signed   By: Titus Dubin M.D.   On: 01/29/2017 16:16   SIGNIFICANT EVENTS  9/30  Admit with acute asthma exacerbation   STUDIES:  HST 07/06/16 >> AHI 11/hour, desaturation to 74%. Spirometry 07/29/2016 >>  moderately severe obstructive airways disease. FVC 2.21/68%, FEV1 1.53/57%, ratio 0.69, FEF 25-75% 0.98/34% CT angio 10/3 >> No evidence of significant pulmonary embolus. No evidence of active pulmonary disease.   ASSESSMENT / PLAN:  Severe persistent asthma with acute exacerbation P: Continue Brovana + Budesonide > she should continue both at discharge Solumedrol 60 mg IV Q12, wean as tolerated, will change to 40 mg prednisone bid on 10/5 in anticipation of dc soon. Hold home [place], fluticasone, loratadine  Dr. Annamaria Boots reviewing for IL5 biologic > has missed Xolair Will need pulmonary follow up at discharge (arranged)  Possible bronchitis P: Continue azithro, D5/5   OSA on CPAP P: CPAP QHS & PRN daytime sleeping  Reviewed importance of compliance with patient again but is poorly compliant. Only wore for a couple of hours. Follow up with Dr. Annamaria Boots at discharge     [place] Pager [phone] till 3 pm If no answer page [phone] 01/31/2017, 9:51 AM

## 2017-02-01 LAB — CBC
HCT: 36.3 % (ref 36.0–46.0)
HEMOGLOBIN: 11.4 g/dL — AB (ref 12.0–15.0)
MCH: 27.8 pg (ref 26.0–34.0)
MCHC: 31.4 g/dL (ref 30.0–36.0)
MCV: 88.5 fL (ref 78.0–100.0)
Platelets: 390 10*3/uL (ref 150–400)
RBC: 4.1 MIL/uL (ref 3.87–5.11)
RDW: 18.7 % — ABNORMAL HIGH (ref 11.5–15.5)
WBC: 25 10*3/uL — ABNORMAL HIGH (ref 4.0–10.5)

## 2017-02-01 MED ORDER — METOPROLOL TARTRATE 50 MG PO TABS: 50.0000 mg | ORAL_TABLET | Freq: Two times a day (BID) | ORAL | 0 refills | Status: DC

## 2017-02-01 MED ORDER — METOPROLOL TARTRATE 50 MG PO TABS
50.0000 mg | ORAL_TABLET | Freq: Two times a day (BID) | ORAL | Status: DC
  Administered 2017-02-01: 50 mg via ORAL
  Filled 2017-02-01: qty 1

## 2017-02-01 MED ORDER — ARFORMOTEROL TARTRATE 15 MCG/2ML IN NEBU: 15.0000 ug | INHALATION_SOLUTION | Freq: Two times a day (BID) | RESPIRATORY_TRACT | 0 refills | Status: DC

## 2017-02-01 MED ORDER — PREDNISONE 20 MG PO TABS: 40.0000 mg | ORAL_TABLET | Freq: Two times a day (BID) | ORAL | 0 refills | Status: DC

## 2017-02-01 NOTE — Discharge Summary (Signed)
Physician Discharge Summary  GUDELIA [name]  NAT:[id number]  DOB: 1970/10/23  DOA: 01/26/2017 PCP: Arnoldo Morale, MD  Admit date: 01/26/2017 Discharge date: 02/01/2017  Admitted From: Home  Disposition:  Home   Recommendations for Outpatient Follow-up:  1. Follow up with PCP in 1 weeks 2. Follow with Pulmonary office on 02/12/17 @ 9:30 AM 3. Please obtain BMP/CBC in one week to monitor WBC and renal function.  4. Please adjust asthma medications and have patient follow up closely, she has had 13 hospital visits in the past 6 month - likely related to non compliance   Discharge Condition: Stable  CODE STATUS: FULL  Diet recommendation: Heart Healthy / Carb Modified   Brief/Interim Summary: April [name] is a 46 year old female with medical history of asthma, OSA who presented to the emergency department complaining of wheezing, cough and difficulty breathing. In the ED she was treated with albuterol, Atrovent and Solu-Medrol with out relief. Chest x-ray was done and did not show any acute process. CT-guided was performed with no evidence of pulmonary embolism. Patient was admitted for acute asthma exacerbation. Pulmonology was consulted start patient on IV Solu-Medrol and adjust nebulizer medications. Patient has clinically improved and breathing has returned to baseline. Patient oxygenating above 91% on room air. Patient was discharged to follow-up with PCP and pulmonary office on prednisone.  Subjective: Patient seen and examined, she feels that her breathing is almost back to baseline, walking without any shortness of breath. No multiple wheezing. Patient breathing on room air no oxygen supplementation has been required. Patient remains afebrile and tolerating diet well.  Discharge Diagnoses/Hospital Course:  Asthma exacerbation -Initially treated with solumedrol transitioned to prednisone 40 mg BID  - Case discussed with Dr Melvyn Novas recommended to continue Prednisone 40 mg BID until seen by  pulmonary office  -Continue nebs Brovana and Pulmicort  -Continue Singulair and Claritin  -On room air - not required O2  -Follow up within 3-5 days with Pulmonology   Hypokalemia - resolved   Morbid obesity Body mass index is 56.85 kg/m.  Leukocytosis Steroids induce - afebrile  Monitor in 1 week patient on high dose prednisone   Hypertension BP slight above goal during hospital stay  Metoprolol increased to 50 BID  Continue Norvasc 10 mg daily, Losartan 100 mg daily, and aldactone 100 mg daily  Monitor BP  Chronic diastolic CHF (EF 42%) No signs of fluid overload - see above for mediations  Follow up with PCP   Non compliant with medication Patient has been followed by [facility] and [facility], discussed importance of medication adherence.   All other chronic medical condition were stable during the hospitalization.  On the day of the discharge the patient's vitals were stable, and no other acute medical condition were reported by patient. Patient was felt safe to be discharge to home   Discharge Instructions  You were cared for by a hospitalist during your hospital stay. If you have any questions about your discharge medications or the care you received while you were in the hospital after you are discharged, you can call the unit and asked to speak with the hospitalist on call if the hospitalist that took care of you is not available. Once you are discharged, your primary care physician will handle any further medical issues. Please note that NO REFILLS for any discharge medications will be authorized once you are discharged, as it is imperative that you return to your primary care physician (or establish a relationship with  a primary care physician if you do not have one) for your aftercare needs so that they can reassess your need for medications and monitor your lab values.  Discharge Instructions    Call MD for:  difficulty breathing, headache or visual disturbances     Complete by:  As directed    Call MD for:  extreme fatigue    Complete by:  As directed    Call MD for:  hives    Complete by:  As directed    Call MD for:  persistant dizziness or light-headedness    Complete by:  As directed    Call MD for:  persistant nausea and vomiting    Complete by:  As directed    Call MD for:  redness, tenderness, or signs of infection (pain, swelling, redness, odor or green/yellow discharge around incision site)    Complete by:  As directed    Call MD for:  severe uncontrolled pain    Complete by:  As directed    Call MD for:  temperature >100.4    Complete by:  As directed    Diet - low sodium heart healthy    Complete by:  As directed    Diet Carb Modified    Complete by:  As directed    Discharge instructions    Complete by:  As directed    Avoid tobacco smoke   Increase activity slowly    Complete by:  As directed      Allergies as of 02/01/2017   No Known Allergies     Medication List    STOP taking these medications   azithromycin 250 MG tablet Commonly known as:  ZITHROMAX     TAKE these medications   albuterol 108 (90 Base) MCG/ACT inhaler Commonly known as:  PROVENTIL HFA;VENTOLIN HFA Inhale 2 puffs into the lungs every 4 (four) hours as needed for wheezing or shortness of breath.   amLODipine 10 MG tablet Commonly known as:  NORVASC Take 1 tablet (10 mg total) by mouth daily.   arformoterol 15 MCG/2ML Nebu Commonly known as:  BROVANA Take 2 mLs (15 mcg total) by nebulization 2 (two) times daily.   budesonide 0.25 MG/2ML nebulizer solution Commonly known as:  PULMICORT Take 2 mLs (0.25 mg total) by nebulization 2 (two) times daily.   butalbital-acetaminophen-caffeine 50-325-40 MG tablet Commonly known as:  FIORICET, ESGIC Take 1-2 tablets by mouth every 6 (six) hours as needed for headache.   fluticasone 50 MCG/ACT nasal spray Commonly known as:  FLONASE Place 2 sprays into both nostrils daily.   furosemide 40 MG  tablet Commonly known as:  LASIX Take 1 tablet (40 mg total) by mouth daily.   guaiFENesin 600 MG 12 hr tablet Commonly known as:  MUCINEX Take 1 tablet (600 mg total) by mouth 2 (two) times daily.   loratadine 10 MG tablet Commonly known as:  CLARITIN Take 1 tablet (10 mg total) by mouth daily.   losartan 100 MG tablet Commonly known as:  COZAAR Take 1 tablet (100 mg total) by mouth daily.   metoprolol tartrate 50 MG tablet Commonly known as:  LOPRESSOR Take 1 tablet (50 mg total) by mouth 2 (two) times daily. What changed:  medication strength  how much to take   mometasone-formoterol 200-5 MCG/ACT Aero Commonly known as:  DULERA Inhale 2 puffs into the lungs 2 (two) times daily.   montelukast 10 MG tablet Commonly known as:  SINGULAIR Take 1 tablet (10 mg total)  by mouth at bedtime.   nicotine 14 mg/24hr patch Commonly known as:  NICODERM CQ - dosed in mg/24 hours Place 1 patch (14 mg total) onto the skin daily. What changed:  when to take this  reasons to take this   omeprazole 20 MG capsule Commonly known as:  PRILOSEC Take 1 capsule (20 mg total) by mouth daily. What changed:  when to take this   predniSONE 20 MG tablet Commonly known as:  DELTASONE Take 2 tablets (40 mg total) by mouth 2 (two) times daily with a meal. What changed:  when to take this   sertraline 100 MG tablet Commonly known as:  ZOLOFT Take 1 tablet (100 mg total) by mouth daily.   spironolactone 100 MG tablet Commonly known as:  ALDACTONE Take 1 tablet (100 mg total) by mouth daily.   zolpidem 5 MG tablet Commonly known as:  AMBIEN Take 1 tablet (5 mg total) by mouth at bedtime as needed for sleep.      Follow-up Information    [place] Go on 02/05/2017.   Why:  at 2:00pm for an appointment with Dr Jarold Song in the [facility].  Contact information: [place] [address] [phone]        Deneise Lever, MD Follow up on 04/07/2017.   Specialty:  Pulmonary Disease Why:  Appt at 2:00 PM Contact information: [address] [place] [address] [phone]        Melvenia Needles, NP Follow up on 02/12/2017.   Specialty:  Pulmonary Disease Why:  Hospital follow up.  Appointment at 9:30.   Contact information: [address] [place] [address] [phone]          No Known Allergies  Consultations:  Pulmonary - [place]    Procedures/Studies: Dg Chest 2 View  Result Date: 01/26/2017 CLINICAL DATA:  Asthma EXAM: CHEST  2 VIEW COMPARISON:  Chest radiograph 01/22/2017 FINDINGS: The heart size and mediastinal contours are within normal limits. Both lungs are clear. The visualized skeletal structures are unremarkable. IMPRESSION: No active cardiopulmonary disease. Electronically Signed   By: Ulyses Jarred M.D.   On: 01/26/2017 16:58   Dg Chest 2 View  Result Date: 01/22/2017 CLINICAL DATA:  Shortness of breath for week. No chest pain. History of hypertension, on medication. EXAM: CHEST  2 VIEW COMPARISON:  Chest radiograph January 17, 2017 FINDINGS: Cardiomediastinal silhouette is normal. No pleural effusions or focal consolidations. Trachea projects midline and there is no pneumothorax. Soft tissue planes and included osseous structures are non-suspicious. Large body habitus. IMPRESSION: Stable examination:  No acute cardiopulmonary process. Electronically Signed   By: Elon Alas M.D.   On: 01/22/2017 20:48   Dg Chest 2 View  Result Date: 01/17/2017 CLINICAL DATA:  Shortness of breath for 2 days EXAM: CHEST  2 VIEW COMPARISON:  01/01/2017 FINDINGS: The heart size and mediastinal contours are within normal limits. Both lungs are clear. The visualized skeletal structures are unremarkable. IMPRESSION: No active cardiopulmonary disease. Electronically Signed   By: Donavan Foil M.D.   On: 01/17/2017 02:00   Ct Angio Chest Pe W/cm &/or Wo Cm  Result  Date: 01/27/2017 CLINICAL DATA:  Shortness of breath for 1 week. Positive D-dimer. Intermediate clinical probability. EXAM: CT ANGIOGRAPHY CHEST WITH CONTRAST TECHNIQUE: Multidetector CT imaging of the chest was performed using the standard protocol during bolus administration of intravenous contrast. Multiplanar CT image reconstructions and MIPs were obtained to  evaluate the vascular anatomy. CONTRAST:  74 mL Isovue 370 COMPARISON:  10/02/2016 FINDINGS: Cardiovascular: Contrast bolus somewhat limits opacification of the pulmonary arteries but the central and proximal segmental pulmonary arteries are well opacified. No evidence of significant central pulmonary embolus. Can't exclude smaller peripheral emboli. Normal heart size. No pericardial effusion. Normal caliber thoracic aorta. No aortic dissection. Great vessel origins are patent. Mediastinum/Nodes: No enlarged mediastinal, hilar, or axillary lymph nodes. Thyroid gland, trachea, and esophagus demonstrate no significant findings. Lungs/Pleura: Evaluation of lungs is limited due to respiratory motion artifact. There is no gross consolidation or airspace disease. No pleural effusions. No pneumothorax. Upper Abdomen: No acute abnormality. Musculoskeletal: No chest wall abnormality. No acute or significant osseous findings. Review of the MIP images confirms the above findings. IMPRESSION: 1. No evidence of significant pulmonary embolus. 2. No evidence of active pulmonary disease. Electronically Signed   By: Lucienne Capers M.D.   On: 01/27/2017 01:38   Dg Chest Port 1 View  Result Date: 01/29/2017 CLINICAL DATA:  Shortness of breath. EXAM: PORTABLE CHEST 1 VIEW COMPARISON:  Chest x-ray dated January 26, 2017. FINDINGS: The cardiomediastinal silhouette is normal in size given technique. Normal pulmonary vascularity. No focal consolidation, pleural effusion, or pneumothorax. No acute osseous abnormality. IMPRESSION: No active disease. Electronically Signed    By: Titus Dubin M.D.   On: 01/29/2017 16:16    Discharge Exam: Vitals:   02/01/17 0508 02/01/17 0752  BP: (!) 160/96   Pulse: 65   Resp: 20   Temp: 98.5 F (36.9 C)   SpO2: 100% 99%   Vitals:   02/01/17 0231 02/01/17 0500 02/01/17 0508 02/01/17 0752  BP:   (!) 160/96   Pulse:   65   Resp:   20   Temp:   98.5 F (36.9 C)   TempSrc:   Oral   SpO2: 100%  100% 99%  Weight:  (!) 158.5 kg (349 lb 6.9 oz)    Height:        General: Pt is alert, awake, not in acute distress Cardiovascular: RRR, S1/S2 +, no rubs, no gallops Respiratory: Non labored, good air entry, faint force expiratory wheezing b/l.  Abdominal: Obese Soft, NT, ND, bowel sounds + Extremities: no edema, no cyanosis  The results of significant diagnostics from this hospitalization (including imaging, microbiology, ancillary and laboratory) are listed below for reference.     Microbiology: Recent Results (from the past 240 hour(s))  MRSA PCR Screening     Status: Abnormal   Collection Time: 01/27/17  2:14 PM  Result Value Ref Range Status   MRSA by PCR POSITIVE (A) NEGATIVE Final    Comment:        The GeneXpert MRSA Assay (FDA approved for NASAL specimens only), is one component of a comprehensive MRSA colonization surveillance program. It is not intended to diagnose MRSA infection nor to guide or monitor treatment for MRSA infections. RESULT CALLED TO, READ BACK BY AND VERIFIED WITH: IHall Busing RN 17:15 01/27/17 ([email or web address])      Labs: BNP (last 3 results)  Recent Labs  06/21/16 1136 11/13/16 1926 01/17/17 0623  BNP 37.3 43.7 64.3   Basic Metabolic Panel:  Recent Labs Lab 01/26/17 2240 01/27/17 1112 01/29/17 0638  NA 134* 136 138  K 2.9* 3.7 3.7  CL 102 102 104  CO2 25 22 25   GLUCOSE 120* 190* 111*  BUN <5* <5* 7  CREATININE 0.65 0.80 0.65  CALCIUM 7.7* 8.1* 8.0*  MG 1.7  --   --  Liver Function Tests:  Recent Labs Lab 01/27/17 1112  AST 26  ALT 18  ALKPHOS 62   BILITOT 0.2*  PROT 6.4*  ALBUMIN 3.1*   No results for input(s): LIPASE, AMYLASE in the last 168 hours. No results for input(s): AMMONIA in the last 168 hours. CBC:  Recent Labs Lab 01/26/17 2014 01/27/17 1112 01/29/17 0638 02/01/17 0353  WBC 17.4* 15.2* 23.7* 25.0*  HGB 12.4 11.2* 11.5* 11.4*  HCT 39.0 35.5* 37.2 36.3  MCV 88.6 87.4 89.2 88.5  PLT 245 361 388 390   Cardiac Enzymes: No results for input(s): CKTOTAL, CKMB, CKMBINDEX, TROPONINI in the last 168 hours. BNP: Invalid input(s): POCBNP CBG:  Recent Labs Lab 01/26/17 2206  GLUCAP 100*   D-Dimer No results for input(s): DDIMER in the last 72 hours. Hgb A1c No results for input(s): HGBA1C in the last 72 hours. Lipid Profile No results for input(s): CHOL, HDL, LDLCALC, TRIG, CHOLHDL, LDLDIRECT in the last 72 hours. Thyroid function studies No results for input(s): TSH, T4TOTAL, T3FREE, THYROIDAB in the last 72 hours.  Invalid input(s): FREET3 Anemia work up No results for input(s): VITAMINB12, FOLATE, FERRITIN, TIBC, IRON, RETICCTPCT in the last 72 hours. Urinalysis    Component Value Date/Time   COLORURINE YELLOW 01/01/2017 0935   APPEARANCEUR HAZY (A) 01/01/2017 0935   LABSPEC 1.012 01/01/2017 0935   PHURINE 5.0 01/01/2017 0935   GLUCOSEU 50 (A) 01/01/2017 0935   GLUCOSEU NEG mg/dL 10/28/2007 2049   HGBUR NEGATIVE 01/01/2017 0935   BILIRUBINUR NEGATIVE 01/01/2017 0935   KETONESUR NEGATIVE 01/01/2017 0935   PROTEINUR NEGATIVE 01/01/2017 0935   UROBILINOGEN 1.0 11/21/2014 0707   NITRITE NEGATIVE 01/01/2017 0935   LEUKOCYTESUR NEGATIVE 01/01/2017 0935   Sepsis Labs Invalid input(s): PROCALCITONIN,  WBC,  LACTICIDVEN Microbiology Recent Results (from the past 240 hour(s))  MRSA PCR Screening     Status: Abnormal   Collection Time: 01/27/17  2:14 PM  Result Value Ref Range Status   MRSA by PCR POSITIVE (A) NEGATIVE Final    Comment:        The GeneXpert MRSA Assay (FDA approved for NASAL  specimens only), is one component of a comprehensive MRSA colonization surveillance program. It is not intended to diagnose MRSA infection nor to guide or monitor treatment for MRSA infections. RESULT CALLED TO, READ BACK BY AND VERIFIED WITH: Micheline Rough RN 17:15 01/27/17 ([email or web address])      Time coordinating discharge: 35 minutes  SIGNED:  Chipper Oman, MD  [facility] 02/01/2017, 1:40 PM  Pager please text page via  [email or web address] Password [email or web address]

## 2017-02-01 NOTE — Progress Notes (Signed)
Patient discharge teaching given, including activity, diet, follow-up appoints, and medications. Patient verbalized understanding of all discharge instructions. IV access was d/c'd. Vitals are stable. Skin is intact except as charted in most recent assessments. Pt to be escorted out by NT, to be driven home by family. 

## 2017-02-03 ENCOUNTER — Telehealth: Payer: Self-pay

## 2017-02-03 NOTE — Telephone Encounter (Signed)
[facility] Post-discharge Follow-Up Phone Call:  Date of Discharge:  02/01/2017 Principal Discharge Diagnosis(es): asthma exacerbation Post-discharge Communication: (Clearly document all attempts clearly and date contact made) call placed to #  [phone] (M) and a HIPAA compliant voicemail message was left requesting a call back to # [phone].  Call Completed: No

## 2017-02-04 ENCOUNTER — Emergency Department (HOSPITAL_COMMUNITY)
Admission: EM | Admit: 2017-02-04 | Discharge: 2017-02-05 | Disposition: A | Discharge status: Home / Self Care | Payer: Medicaid Other | Attending: Family Medicine | Admitting: Family Medicine

## 2017-02-04 ENCOUNTER — Emergency Department (HOSPITAL_COMMUNITY): Payer: Medicaid Other

## 2017-02-04 ENCOUNTER — Encounter (HOSPITAL_COMMUNITY): Payer: Self-pay

## 2017-02-04 ENCOUNTER — Telehealth: Payer: Self-pay

## 2017-02-04 DIAGNOSIS — J988 Other specified respiratory disorders: Secondary | ICD-10-CM | POA: Insufficient documentation

## 2017-02-04 DIAGNOSIS — Z765 Malingerer [conscious simulation]: Secondary | ICD-10-CM

## 2017-02-04 DIAGNOSIS — J449 Chronic obstructive pulmonary disease, unspecified: Secondary | ICD-10-CM | POA: Diagnosis not present

## 2017-02-04 DIAGNOSIS — I10 Essential (primary) hypertension: Secondary | ICD-10-CM | POA: Diagnosis not present

## 2017-02-04 DIAGNOSIS — Z79899 Other long term (current) drug therapy: Secondary | ICD-10-CM | POA: Insufficient documentation

## 2017-02-04 DIAGNOSIS — J45901 Unspecified asthma with (acute) exacerbation: Secondary | ICD-10-CM

## 2017-02-04 DIAGNOSIS — Z87891 Personal history of nicotine dependence: Secondary | ICD-10-CM | POA: Diagnosis not present

## 2017-02-04 DIAGNOSIS — R0600 Dyspnea, unspecified: Secondary | ICD-10-CM | POA: Diagnosis present

## 2017-02-04 DIAGNOSIS — J45909 Unspecified asthma, uncomplicated: Secondary | ICD-10-CM | POA: Diagnosis present

## 2017-02-04 DIAGNOSIS — I110 Hypertensive heart disease with heart failure: Secondary | ICD-10-CM | POA: Insufficient documentation

## 2017-02-04 DIAGNOSIS — G4733 Obstructive sleep apnea (adult) (pediatric): Secondary | ICD-10-CM

## 2017-02-04 DIAGNOSIS — R0982 Postnasal drip: Secondary | ICD-10-CM

## 2017-02-04 DIAGNOSIS — I5032 Chronic diastolic (congestive) heart failure: Secondary | ICD-10-CM | POA: Diagnosis not present

## 2017-02-04 DIAGNOSIS — R062 Wheezing: Secondary | ICD-10-CM

## 2017-02-04 LAB — CBC WITH DIFFERENTIAL/PLATELET
Basophils Absolute: 0 10*3/uL (ref 0.0–0.1)
Basophils Relative: 0 %
EOS PCT: 0 %
Eosinophils Absolute: 0 10*3/uL (ref 0.0–0.7)
HEMATOCRIT: 37.6 % (ref 36.0–46.0)
HEMOGLOBIN: 11.9 g/dL — AB (ref 12.0–15.0)
LYMPHS ABS: 0.8 10*3/uL (ref 0.7–4.0)
LYMPHS PCT: 4 %
MCH: 27.5 pg (ref 26.0–34.0)
MCHC: 31.6 g/dL (ref 30.0–36.0)
MCV: 87 fL (ref 78.0–100.0)
Monocytes Absolute: 0.2 10*3/uL (ref 0.1–1.0)
Monocytes Relative: 1 %
NEUTROS ABS: 20.7 10*3/uL — AB (ref 1.7–7.7)
Neutrophils Relative %: 95 %
PLATELETS: 409 10*3/uL — AB (ref 150–400)
RBC: 4.32 MIL/uL (ref 3.87–5.11)
RDW: 19.8 % — ABNORMAL HIGH (ref 11.5–15.5)
WBC: 21.8 10*3/uL — AB (ref 4.0–10.5)

## 2017-02-04 LAB — BASIC METABOLIC PANEL
Anion gap: 14 (ref 5–15)
BUN: 7 mg/dL (ref 6–20)
CALCIUM: 8.7 mg/dL — AB (ref 8.9–10.3)
CHLORIDE: 99 mmol/L — AB (ref 101–111)
CO2: 22 mmol/L (ref 22–32)
Creatinine, Ser: 0.81 mg/dL (ref 0.44–1.00)
GFR calc Af Amer: >60 mL/min (ref >60)
GLUCOSE: 273 mg/dL — AB (ref 65–99)
POTASSIUM: 3.6 mmol/L (ref 3.5–5.1)
Sodium: 135 mmol/L (ref 135–145)

## 2017-02-04 LAB — HEMOGLOBIN A1C
Hgb A1c MFr Bld: 6.1 % — ABNORMAL HIGH (ref 4.8–5.6)
MEAN PLASMA GLUCOSE: 128.37 mg/dL

## 2017-02-04 LAB — RESPIRATORY PANEL BY PCR
Adenovirus: NOT DETECTED
BORDETELLA PERTUSSIS-RVPCR: NOT DETECTED
CHLAMYDOPHILA PNEUMONIAE-RVPPCR: NOT DETECTED
CORONAVIRUS 229E-RVPPCR: NOT DETECTED
CORONAVIRUS HKU1-RVPPCR: NOT DETECTED
Coronavirus NL63: NOT DETECTED
Coronavirus OC43: NOT DETECTED
INFLUENZA B-RVPPCR: NOT DETECTED
Influenza A: NOT DETECTED
MYCOPLASMA PNEUMONIAE-RVPPCR: NOT DETECTED
Metapneumovirus: NOT DETECTED
Parainfluenza Virus 1: NOT DETECTED
Parainfluenza Virus 2: NOT DETECTED
Parainfluenza Virus 3: NOT DETECTED
Parainfluenza Virus 4: NOT DETECTED
RESPIRATORY SYNCYTIAL VIRUS-RVPPCR: NOT DETECTED
Rhinovirus / Enterovirus: DETECTED — AB

## 2017-02-04 LAB — INFLUENZA PANEL BY PCR (TYPE A & B)
INFLAPCR: NEGATIVE
INFLBPCR: NEGATIVE

## 2017-02-04 MED ORDER — MONTELUKAST SODIUM 10 MG PO TABS
10.0000 mg | ORAL_TABLET | Freq: Every day | ORAL | Status: DC
  Administered 2017-02-04: 10 mg via ORAL
  Filled 2017-02-04: qty 1

## 2017-02-04 MED ORDER — IPRATROPIUM-ALBUTEROL 0.5-2.5 (3) MG/3ML IN SOLN
3.0000 mL | Freq: Once | RESPIRATORY_TRACT | Status: AC
  Administered 2017-02-04: 3 mL via RESPIRATORY_TRACT
  Filled 2017-02-04: qty 3

## 2017-02-04 MED ORDER — SPIRONOLACTONE 100 MG PO TABS
100.0000 mg | ORAL_TABLET | Freq: Every day | ORAL | Status: DC
  Administered 2017-02-04: 100 mg via ORAL
  Filled 2017-02-04: qty 1

## 2017-02-04 MED ORDER — BUDESONIDE 0.25 MG/2ML IN SUSP
0.2500 mg | Freq: Two times a day (BID) | RESPIRATORY_TRACT | Status: DC
  Administered 2017-02-04: 0.25 mg via RESPIRATORY_TRACT
  Filled 2017-02-04 (×2): qty 2

## 2017-02-04 MED ORDER — FUROSEMIDE 10 MG/ML IJ SOLN
40.0000 mg | Freq: Once | INTRAMUSCULAR | Status: AC
  Administered 2017-02-04: 40 mg via INTRAVENOUS

## 2017-02-04 MED ORDER — ALBUTEROL SULFATE (2.5 MG/3ML) 0.083% IN NEBU
INHALATION_SOLUTION | RESPIRATORY_TRACT | Status: AC
  Filled 2017-02-04: qty 6

## 2017-02-04 MED ORDER — LOSARTAN POTASSIUM 50 MG PO TABS
100.0000 mg | ORAL_TABLET | Freq: Every day | ORAL | Status: DC
  Administered 2017-02-04: 100 mg via ORAL
  Filled 2017-02-04: qty 2

## 2017-02-04 MED ORDER — ARFORMOTEROL TARTRATE 15 MCG/2ML IN NEBU
15.0000 ug | INHALATION_SOLUTION | Freq: Two times a day (BID) | RESPIRATORY_TRACT | Status: DC
  Administered 2017-02-04: 15 ug via RESPIRATORY_TRACT
  Filled 2017-02-04 (×2): qty 2

## 2017-02-04 MED ORDER — AMLODIPINE BESYLATE 5 MG PO TABS
10.0000 mg | ORAL_TABLET | Freq: Every day | ORAL | Status: DC
  Administered 2017-02-04: 10 mg via ORAL
  Filled 2017-02-04: qty 2

## 2017-02-04 MED ORDER — METHYLPREDNISOLONE SODIUM SUCC 125 MG IJ SOLR
125.0000 mg | Freq: Once | INTRAMUSCULAR | Status: AC
  Administered 2017-02-04: 125 mg via INTRAVENOUS
  Filled 2017-02-04: qty 2

## 2017-02-04 MED ORDER — ALBUTEROL (5 MG/ML) CONTINUOUS INHALATION SOLN
10.0000 mg/h | INHALATION_SOLUTION | RESPIRATORY_TRACT | Status: AC
  Administered 2017-02-04: 10 mg/h via RESPIRATORY_TRACT
  Filled 2017-02-04: qty 20

## 2017-02-04 MED ORDER — METOPROLOL TARTRATE 25 MG PO TABS
50.0000 mg | ORAL_TABLET | Freq: Two times a day (BID) | ORAL | Status: DC
  Administered 2017-02-04: 50 mg via ORAL
  Filled 2017-02-04: qty 2

## 2017-02-04 MED ORDER — ALBUTEROL SULFATE (2.5 MG/3ML) 0.083% IN NEBU
5.0000 mg | INHALATION_SOLUTION | Freq: Once | RESPIRATORY_TRACT | Status: AC
Start: 2017-02-04 — End: 2017-02-04
  Administered 2017-02-04: 5 mg via RESPIRATORY_TRACT

## 2017-02-04 MED ORDER — MOMETASONE FURO-FORMOTEROL FUM 200-5 MCG/ACT IN AERO
2.0000 | INHALATION_SPRAY | Freq: Two times a day (BID) | RESPIRATORY_TRACT | Status: DC
  Administered 2017-02-04: 2 via RESPIRATORY_TRACT
  Filled 2017-02-04: qty 8.8

## 2017-02-04 MED ORDER — FLUTICASONE PROPIONATE 50 MCG/ACT NA SUSP
2.0000 | Freq: Every day | NASAL | Status: DC
  Filled 2017-02-04: qty 16

## 2017-02-04 MED ORDER — FUROSEMIDE 10 MG/ML IJ SOLN
INTRAMUSCULAR | Status: AC
  Filled 2017-02-04: qty 4

## 2017-02-04 MED ORDER — ALBUTEROL SULFATE HFA 108 (90 BASE) MCG/ACT IN AERS: 2.0000 | INHALATION_SPRAY | RESPIRATORY_TRACT | 3 refills | Status: DC | PRN

## 2017-02-04 NOTE — ED Notes (Signed)
Pt ambulated with steady gait, bedisde O2 sat was 100%, during ambulation O2 sat declined to 92%, safely returned to bedside and O2 sat returned to 100%

## 2017-02-04 NOTE — ED Provider Notes (Signed)
Patient care received from Montine Circle, [place] at [phone].  46 year old female history of recurrent asthma exacerbations who presents with wheezing and shortness of breath concerning for asthma exacerbation. Recent discharge 3 days ago. Denies previous intubation. States she has been compliant with steroids and inhalers. Noncompliant with night time CPAP use. Continues to have upper airway radiating wheezing sounds on exam. States she does not feel better despite duonebs. Inpatient team consulted for admission.  Inpatient team evaluated patient. She is at her stable baseline. Appears to worsen when provider is in room. Pulmonology also evaluated pt. Lasix given. Pt ambulated without desaturations. Pt stable for close PCP f/u.  Return precautions provided for worsening symptoms. Pt will f/u with PCP at first availability. Pt verbalized agreement with plan.   Payton Emerald, MD 02/04/17 1421    Julianne Rice, MD 02/05/17 1024

## 2017-02-04 NOTE — Consult Note (Signed)
Name: April [name] MRN: [id number] DOB: 1970-08-18    ADMISSION DATE:  02/04/2017 CONSULTATION DATE:  02/04/2017  REFERRING MD :  Marily Memos  CHIEF COMPLAINT:  Wheezing and SOB  BRIEF PATIENT DESCRIPTION: 46 year old with history of non-compliance and difficult to control asthma who was just discharged 3 days prior and now returns with wheezing.  She reports that she was taking all her medications as she was instructed to do so upon discharge.  Also reports a temperature of 100F but no cough or sputum production.  Notes from the office (patient of Dr. Annamaria Boots) report missing multiple treatment for Xolair and issues with confusion about inhalers as well as non-compliance.  Patient was admitted 18 times this year 42 of which were asthma related.  Also history of OSA on CPAP that she uses infrequently per patient.  No previous history of intubation.  SIGNIFICANT EVENTS    STUDIES:  CXR negative 10/9   HISTORY OF PRESENT ILLNESS:  46 year old with history of non-compliance and difficult to control asthma who was just discharged 3 days prior and now returns with wheezing.  She reports that she was taking all her medications as she was instructed to do so upon discharge.  Also reports a temperature of 100F but no cough or sputum production.  Notes from the office (patient of Dr. Annamaria Boots) report missing multiple treatment for Xolair and issues with confusion about inhalers as well as non-compliance.  Patient was admitted 18 times this year 66 of which were asthma related.  Also history of OSA on CPAP that she uses infrequently per patient.  No previous history of intubation.  PAST MEDICAL HISTORY :   has a past medical history of Acanthosis nigricans; Arthritis; Asthma; COPD (chronic obstructive pulmonary disease) ([place]); Depression; GERD (gastroesophageal reflux disease); Headache(784.0); Helicobacter pylori (H. pylori) infection; Hypertension, essential; Insomnia; Menorrhagia; Morbid obesity ([place]); Obesity;  Seasonal allergies; Shortness of breath; Sleep apnea; and Tobacco user.  has a past surgical history that includes Tubal ligation (1996) and Breast reduction surgery (09/2011). Prior to Admission medications   Medication Sig Start Date End Date Taking? Authorizing Provider  albuterol (PROVENTIL HFA;VENTOLIN HFA) 108 (90 Base) MCG/ACT inhaler Inhale 2 puffs into the lungs every 4 (four) hours as needed for wheezing or shortness of breath. 01/23/17  Yes Montine Circle, PA-C  amLODipine (NORVASC) 10 MG tablet Take 1 tablet (10 mg total) by mouth daily. 12/13/16  Yes Rai, Ripudeep K, MD  arformoterol (BROVANA) 15 MCG/2ML NEBU Take 2 mLs (15 mcg total) by nebulization 2 (two) times daily. 02/01/17  Yes Patrecia Pour, Christean Grief, MD  budesonide (PULMICORT) 0.25 MG/2ML nebulizer solution Take 2 mLs (0.25 mg total) by nebulization 2 (two) times daily. 01/02/17  Yes Patrecia Pour, Christean Grief, MD  butalbital-acetaminophen-caffeine (FIORICET, ESGIC) [phone] MG tablet Take 1-2 tablets by mouth every 6 (six) hours as needed for headache. 12/19/16 12/19/17 Yes Joy, Shawn C, PA-C  fluticasone (FLONASE) 50 MCG/ACT nasal spray Place 2 sprays into both nostrils daily. 12/13/16  Yes Rai, Ripudeep K, MD  furosemide (LASIX) 40 MG tablet Take 1 tablet (40 mg total) by mouth daily. 01/17/17 01/17/18 Yes Short, Noah Delaine, MD  guaiFENesin (MUCINEX) 600 MG 12 hr tablet Take 1 tablet (600 mg total) by mouth 2 (two) times daily. 12/13/16  Yes Rai, Ripudeep K, MD  loratadine (CLARITIN) 10 MG tablet Take 1 tablet (10 mg total) by mouth daily. 08/30/15  Yes [name], MD  losartan (COZAAR) 100 MG tablet Take 1 tablet (  100 mg total) by mouth daily. 12/13/16  Yes Rai, Ripudeep K, MD  metoprolol tartrate (LOPRESSOR) 50 MG tablet Take 1 tablet (50 mg total) by mouth 2 (two) times daily. 02/01/17  Yes Patrecia Pour, Christean Grief, MD  mometasone-formoterol [facility]) 200-5 MCG/ACT AERO Inhale 2 puffs into the lungs 2 (two) times daily. 01/06/17  Yes Young, Tarri Fuller D,  MD  montelukast (SINGULAIR) 10 MG tablet Take 1 tablet (10 mg total) by mouth at bedtime. 01/02/17  Yes Patrecia Pour, Christean Grief, MD  nicotine (NICODERM CQ - DOSED IN MG/24 HOURS) 14 mg/24hr patch Place 1 patch (14 mg total) onto the skin daily. Patient taking differently: Place 14 mg onto the skin daily as needed (smoking cessation).  10/04/16  Yes Reyne Dumas, MD  omeprazole (PRILOSEC) 20 MG capsule Take 1 capsule (20 mg total) by mouth daily. Patient taking differently: Take 20 mg by mouth 2 (two) times daily.  10/09/16  Yes Young, Tarri Fuller D, MD  predniSONE (DELTASONE) 20 MG tablet Take 2 tablets (40 mg total) by mouth 2 (two) times daily with a meal. 02/01/17  Yes Patrecia Pour, Christean Grief, MD  sertraline (ZOLOFT) 100 MG tablet Take 1 tablet (100 mg total) by mouth daily. 12/27/16  Yes Arnoldo Morale, MD  spironolactone (ALDACTONE) 100 MG tablet Take 1 tablet (100 mg total) by mouth daily. 01/17/17  Yes Short, Noah Delaine, MD  zolpidem (AMBIEN) 5 MG tablet Take 1 tablet (5 mg total) by mouth at bedtime as needed for sleep. 12/22/16  Yes Mikhail, Assaria, DO   No Known Allergies  FAMILY HISTORY:  family history includes Asthma in her daughter and maternal grandmother; Cancer in her paternal aunt; Hypertension in her mother. SOCIAL HISTORY:  reports that she quit smoking about 2 years ago. Her smoking use included Cigarettes. She has a 9.00 pack-year smoking history. She has never used smokeless tobacco. She reports that she does not drink alcohol or use drugs.  REVIEW OF SYSTEMS:   Constitutional: Negative for fever, chills, weight loss, malaise/fatigue and diaphoresis.  HENT: Negative for hearing loss, ear pain, nosebleeds, congestion, sore throat, neck pain, tinnitus and ear discharge.   Eyes: Negative for blurred vision, double vision, photophobia, pain, discharge and redness.  Respiratory: Negative for cough, hemoptysis, sputum production, shortness of breath, wheezing and stridor.   Cardiovascular:  Negative for chest pain, palpitations, orthopnea, claudication, leg swelling and PND.  Gastrointestinal: Negative for heartburn, nausea, vomiting, abdominal pain, diarrhea, constipation, blood in stool and melena.  Genitourinary: Negative for dysuria, urgency, frequency, hematuria and flank pain.  Musculoskeletal: Negative for myalgias, back pain, joint pain and falls.  Skin: Negative for itching and rash.  Neurological: Negative for dizziness, tingling, tremors, sensory change, speech change, focal weakness, seizures, loss of consciousness, weakness and headaches.  Endo/Heme/Allergies: Negative for environmental allergies and polydipsia. Does not bruise/bleed easily.  SUBJECTIVE: SOB, wheezing and DOE  VITAL SIGNS: Temp:  [99.5 F (37.5 C)] 99.5 F (37.5 C) (10/09 0218) Pulse Rate:  [95-112] 95 (10/09 0900) Resp:  [20] 20 (10/09 0218) BP: (163-192)/(76-103) 163/91 (10/09 0900) SpO2:  [96 %-100 %] 98 % (10/09 0956) Weight:  [158.3 kg (349 lb)] 158.3 kg (349 lb) (10/09 8413)  PHYSICAL EXAMINATION: General:  Morbidly obese female, in NAD when observed from outside the room that was in respiratory distress upon my entering the room.  Observed walking through the hall way with no wheezing and no respiratory distress. Neuro:  Awake and interactive, moving all ext to command HEENT:  [place]/AT, PERRL, EOM-I and MMM  Cardiovascular:  RRR, Nl S1/S2, -M/R/G. Lungs:  Wheezing clearly from the upper airway not from the lungs (audible over the neck significantly louder than the lungs) and resolved with pursed lip breathing. Abdomen:  Soft, NT, ND and +BS Musculoskeletal:  -edema and -tenderness Skin:  Intact   Recent Labs Lab 01/29/17 0638 02/04/17 0658  NA 138 135  K 3.7 3.6  CL 104 99*  CO2 25 22  BUN 7 7  CREATININE 0.65 0.81  GLUCOSE 111* 273*    Recent Labs Lab 01/29/17 0638 02/01/17 0353 02/04/17 0549  HGB 11.5* 11.4* 11.9*  HCT 37.2 36.3 37.6  WBC 23.7* 25.0* 21.8*  PLT 388  390 409*   Dg Chest 2 View  Result Date: 02/04/2017 CLINICAL DATA:  Dyspnea.  History of asthma. EXAM: CHEST  2 VIEW COMPARISON:  01/29/2017 FINDINGS: The lungs are clear. The pulmonary vasculature is normal. Heart size is normal. Hilar and mediastinal contours are unremarkable. There is no pleural effusion. IMPRESSION: No active cardiopulmonary disease. Electronically Signed   By: Andreas Newport M.D.   On: 02/04/2017 03:09   I reviewed CXR myself, no acute disease noted.  ASSESSMENT / PLAN:  46 year old morbidly obese female with compliance issues and well as concern for malingering.  Patient was clearly walking the hallway with absolutely no difficulty.  She was not wheezing when I was listening to her from outside the room but the second I walked into the room she began to audibly wheeze that I could here from a cross the room.  This is clearly a case of malingering and non-compliance with medications in a patient with asthma that is difficult to determine the extent off.  Discussed with [facility]-MD.  Asthma:  - Dulera  - Dosing of PO steroids that [facility] will order  - No need for abx  - No IV steroids  - Singulair  - Recommend not missing any further xolair treatment  - Outpatient appointment made by [facility]-MD  PND:  - Fluticasone nasal spray  - Loratadine  OSA:  - CPAP, recommended compliance with treatment as that can contribute negatively to her overall health in the future.  Malingering:  - May need to consider a psych consult  PCCM will sign off, please call back if needed  Rush Farmer, M.D. [facility] Pulmonary/Critical Care Medicine. Pager: [phone]. After hours pager: [phone]  02/04/2017, 10:08 AM

## 2017-02-04 NOTE — ED Notes (Signed)
Family at bedside. 

## 2017-02-04 NOTE — Consult Note (Signed)
Consultation Note   April [name] STM:[id number] DOB: November 30, 1970 DOA: 02/04/2017   PCP: April Morale, MD   Consulting physician: April [name]  Requesting physician: April [name]  Reason for consultation: Dyspnea-?? Asthma exacerbation  HPI: April [name] is a 46 y.o. female with medical history significant for chronic/difficult to control asthma. She is followed by pulmonology/Dr. Annamaria [name] as an outpatient. Her other past medical history includes morbid obesity, sleep apnea on nocturnal CPAP hypertension and chronic diastolic heart failure. She is repeatedly in the ER (typically 2-3 times a month) for reports of wheezing and dyspnea. In 2018 she has been evaluated in the ER and/or been admitted 18 times not including today's visit. Only 2 of those visits were non-asthma related symptoms. She was evaluated by pulmonary medicine during the previous admission (she was just discharged on 10/6 after a 5 day admission). Her home pulmonary medications were listed as Dulera, budesonide and prn albuterol. April [name] was also listed as home med but she reported to the pulmonologist that she was not taking that prior to previous admission. She reported compliance with her pulmonary medications. During the previous admission consideration was given to possible PE and this was ruled out. She was treated for bronchitis with Zithromax. She also receives Xolair injections at the pulmonary office and has missed treatments. It was documented that Dr. Annamaria [name] was considering using an IL 5 biologic agent which she could take less frequently. She was discharged on prednisone 40 mg BID to continue until she was reevaluated in the pulmonary office. She was continued on her Brovana and Pulmicort nebs, Singulair and Claritin. She was not hypoxic and therefore did not require home O2.  She returns to the ER today complaining of wheezing that resumed on 10/8. She denied fevers, chills or productive cough to the ER physician but when I questioned  her she reports fevers up to 100 F. She denies noncompliance with medications although admitted to not utilizing her CPAP overnight. She did admit to not performing peak flows to monitor her asthma symptoms although she reports she has been instructed to do so in the past. In the ER her chest x-ray was unremarkable for infiltrate or edema. She was afebrile only a very low-grade fever 99.5. Her initial blood pressure was elevated at 188/94. She was mildly tachycardic but not tachypneic. Her room-air saturations were 98%. She did have a leukocytosis of 21,800 this has improved over the leukocytosis present at time of discharge which was attributed to her steroids. Her glucose was up to 273 and she does not carry a diagnosis of diabetes. She has been observed walking in and out of her room in the ER in no distress.  ED Course:  Vital Signs: BP (!) 163/76   Pulse 97   Temp 99.5 F (37.5 C) (Oral)   Resp 20   Ht 5\' 5"  (1.651 m)   Wt (!) 158.3 kg (349 lb)   SpO2 98%   BMI 58.08 kg/m  2 view CXR: Neg Lab data: Sodium 135, potassium 3.6, chloride 99, CO2 22, glucose 273, BUN 7, creatinine 0.81, anion gap 14, white count 21,800 with neutrophils 95% and absolute rest 20.7%, hemoglobin 1.9, platelets 409,000 Medications and treatments: Proventil neb 5 mg 1, DuoNeb 1, prednisone 125 mg 1, albuterol continuous neb 10 mg/hr 1  Review of Systems:  In addition to the HPI above,  No chills, myalgias or other constitutional symptoms No Headache, changes with Vision or hearing, new weakness, tingling, numbness in any extremity, dizziness,  dysarthria or word finding difficulty, gait disturbance or imbalance, tremors or seizure activity No problems swallowing food or Liquids, indigestion/reflux, choking or coughing while eating, abdominal pain with or after eating No Chest pain, Cough, palpitations, orthopnea or DOE No Abdominal pain, N/V, melena,hematochezia, dark tarry stools, constipation No dysuria,  malodorous urine, hematuria or flank pain No new skin rashes, lesions, masses or bruises, No new joint pains, aches, swelling or redness No recent unintentional weight gain or loss No polyuria, polydypsia or polyphagia   Past Medical History:  Diagnosis Date  . Acanthosis nigricans   . Arthritis   . Asthma    Followed by Dr. Annamaria [name] (pulmonology); receives every other week omalizumab injections; has frequent exacerbations  . COPD (chronic obstructive pulmonary disease) ([place])    PFTs in 2002, FEV1/FVC 65, no post bronchodilater test done  . Depression   . GERD (gastroesophageal reflux disease)   . Headache(784.0)   . Helicobacter pylori (H. pylori) infection   . Hypertension, essential   . Insomnia   . Menorrhagia   . Morbid obesity ([place])   . Obesity   . Seasonal allergies   . Shortness of breath   . Sleep apnea    Sleep study 2008 - mild OSA, not enough events to titrate CPAP  . Tobacco user     Past Surgical History:  Procedure Laterality Date  . BREAST REDUCTION SURGERY  09/2011  . TUBAL LIGATION  1996   bilateral    Social History   Social History  . Marital status: Single    Spouse name: N/A  . Number of children: 1  . Years of education: N/A   Occupational History  . CNA    Social History Main Topics  . Smoking status: Former Smoker    Packs/day: 0.50    Years: 18.00    Types: Cigarettes    Quit date: 04/29/2014  . Smokeless tobacco: Never Used  . Alcohol use No  . Drug use: No  . Sexual activity: Not on file   Other Topics Concern  . 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     Mobility: Independent Work history: Disabled   No Known Allergies  Family History  Problem Relation Age of Onset  . Hypertension Mother   . Asthma Daughter   . Cancer Paternal Aunt   . Asthma Maternal Grandmother      Prior to  Admission medications   Medication Sig Start Date End Date Taking? Authorizing Provider  albuterol (PROVENTIL HFA;VENTOLIN HFA) 108 (90 Base) MCG/ACT inhaler Inhale 2 puffs into the lungs every 4 (four) hours as needed for wheezing or shortness of breath. 01/23/17  Yes Montine Circle, PA-C  amLODipine (NORVASC) 10 MG tablet Take 1 tablet (10 mg total) by mouth daily. 12/13/16  Yes Rai, Ripudeep K, MD  arformoterol (BROVANA) 15 MCG/2ML NEBU Take 2 mLs (15 mcg total) by nebulization 2 (two) times daily. 02/01/17  Yes Patrecia Pour, Christean Grief, MD  budesonide (PULMICORT) 0.25 MG/2ML nebulizer solution Take 2 mLs (0.25 mg total) by nebulization 2 (two) times daily. 01/02/17  Yes Patrecia Pour, Christean Grief, MD  butalbital-acetaminophen-caffeine (FIORICET, ESGIC) [phone] MG tablet Take 1-2 tablets by mouth every 6 (six) hours as needed for headache. 12/19/16 12/19/17 Yes Joy, Shawn C, PA-C  fluticasone (FLONASE) 50 MCG/ACT nasal spray Place 2 sprays into both nostrils daily. 12/13/16  Yes Rai, Ripudeep K, MD  furosemide (LASIX) 40 MG tablet Take 1 tablet (40 mg total) by mouth daily. 01/17/17 01/17/18 Yes Short, Noah Delaine, MD  guaiFENesin (MUCINEX) 600 MG 12 hr tablet Take 1 tablet (600 mg total) by mouth 2 (two) times daily. 12/13/16  Yes Rai, Ripudeep K, MD  loratadine (CLARITIN) 10 MG tablet Take 1 tablet (10 mg total) by mouth daily. 08/30/15  Yes [name], MD  losartan (COZAAR) 100 MG tablet Take 1 tablet (100 mg total) by mouth daily. 12/13/16  Yes Rai, Ripudeep K, MD  metoprolol tartrate (LOPRESSOR) 50 MG tablet Take 1 tablet (50 mg total) by mouth 2 (two) times daily. 02/01/17  Yes Patrecia Pour, Christean Grief, MD  mometasone-formoterol [facility]) 200-5 MCG/ACT AERO Inhale 2 puffs into the lungs 2 (two) times daily. 01/06/17  Yes Young, Tarri Fuller D, MD  montelukast (SINGULAIR) 10 MG tablet Take 1 tablet (10 mg total) by mouth at bedtime. 01/02/17  Yes Patrecia Pour, Christean Grief, MD  nicotine (NICODERM CQ - DOSED IN MG/24 HOURS) 14 mg/24hr  patch Place 1 patch (14 mg total) onto the skin daily. Patient taking differently: Place 14 mg onto the skin daily as needed (smoking cessation).  10/04/16  Yes Reyne Dumas, MD  omeprazole (PRILOSEC) 20 MG capsule Take 1 capsule (20 mg total) by mouth daily. Patient taking differently: Take 20 mg by mouth 2 (two) times daily.  10/09/16  Yes Young, Tarri Fuller D, MD  predniSONE (DELTASONE) 20 MG tablet Take 2 tablets (40 mg total) by mouth 2 (two) times daily with a meal. 02/01/17  Yes Patrecia Pour, Christean Grief, MD  sertraline (ZOLOFT) 100 MG tablet Take 1 tablet (100 mg total) by mouth daily. 12/27/16  Yes April Morale, MD  spironolactone (ALDACTONE) 100 MG tablet Take 1 tablet (100 mg total) by mouth daily. 01/17/17  Yes Short, Noah Delaine, MD  zolpidem (AMBIEN) 5 MG tablet Take 1 tablet (5 mg total) by mouth at bedtime as needed for sleep. 12/22/16  Yes Cristal Ford, DO    Physical Exam: Vitals:   02/04/17 0534 02/04/17 0545 02/04/17 0600 02/04/17 0608  BP: (!) 189/103 (!) 192/95 (!) 163/76   Pulse: (!) 112 (!) 104 97   Resp:      Temp:      TempSrc:      SpO2: 98% 96% 98%   Weight:    (!) 158.3 kg (349 lb)  Height:    5\' 5"  (1.651 m)      Constitutional: NAD, calm, comfortable Eyes: PERRL, lids and conjunctivae normal ENMT: Mucous membranes are moist. Posterior pharynx clear of any exudate or lesions.Normal dentition.  Neck: Large/short, supple, no masses, no thyromegaly Respiratory: Fine expiratory crackles in the mid fields; + upper airway wheezing (i.e. posterior pharynx). Normal respiratory effort without accessory muscle use. RA 98% Cardiovascular: Regular rate and rhythm, no murmurs / rubs / gallops. No extremity edema. 2+ pedal pulses. No carotid bruits.  Abdomen: no tenderness, no masses palpated. No hepatosplenomegaly. Bowel sounds positive.  Musculoskeletal: no clubbing / cyanosis. No joint deformity upper and lower extremities. Good ROM, no contractures. Normal muscle tone.  Skin:  no rashes, lesions, ulcers. No induration Neurologic: CN 2-12 grossly intact. Sensation intact, DTR normal. Strength 5/5 x all 4 extremities.  Psychiatric: Normal judgment and insight. Alert and oriented x 3. Normal mood.    Labs on Admission: I have personally reviewed following labs and imaging studies  CBC:  Recent Labs Lab 01/29/17 0638 02/01/17 0353 02/04/17 0549  WBC 23.7* 25.0*  21.8*  NEUTROABS  --   --  20.7*  HGB 11.5* 11.4* 11.9*  HCT 37.2 36.3 37.6  MCV 89.2 88.5 87.0  PLT 388 390 081*   Basic Metabolic Panel:  Recent Labs Lab 01/29/17 0638 02/04/17 0658  NA 138 135  K 3.7 3.6  CL 104 99*  CO2 25 22  GLUCOSE 111* 273*  BUN 7 7  CREATININE 0.65 0.81  CALCIUM 8.0* 8.7*   GFR: Estimated Creatinine Clearance: 136.4 mL/min (by C-G formula based on SCr of 0.81 mg/dL). Liver Function Tests: No results for input(s): AST, ALT, ALKPHOS, BILITOT, PROT, ALBUMIN in the last 168 hours. No results for input(s): LIPASE, AMYLASE in the last 168 hours. No results for input(s): AMMONIA in the last 168 hours. Coagulation Profile: No results for input(s): INR, PROTIME in the last 168 hours. Cardiac Enzymes: No results for input(s): CKTOTAL, CKMB, CKMBINDEX, TROPONINI in the last 168 hours. BNP (last 3 results) No results for input(s): PROBNP in the last 8760 hours. HbA1C: No results for input(s): HGBA1C in the last 72 hours. CBG: No results for input(s): GLUCAP in the last 168 hours. Lipid Profile: No results for input(s): CHOL, HDL, LDLCALC, TRIG, CHOLHDL, LDLDIRECT in the last 72 hours. Thyroid Function Tests: No results for input(s): TSH, T4TOTAL, FREET4, T3FREE, THYROIDAB in the last 72 hours. Anemia Panel: No results for input(s): VITAMINB12, FOLATE, FERRITIN, TIBC, IRON, RETICCTPCT in the last 72 hours. Urine analysis:    Component Value Date/Time   COLORURINE YELLOW 01/01/2017 0935   APPEARANCEUR HAZY (A) 01/01/2017 0935   LABSPEC 1.012 01/01/2017 0935    PHURINE 5.0 01/01/2017 0935   GLUCOSEU 50 (A) 01/01/2017 0935   GLUCOSEU NEG mg/dL 10/28/2007 2049   HGBUR NEGATIVE 01/01/2017 0935   BILIRUBINUR NEGATIVE 01/01/2017 0935   KETONESUR NEGATIVE 01/01/2017 0935   PROTEINUR NEGATIVE 01/01/2017 0935   UROBILINOGEN 1.0 11/21/2014 0707   NITRITE NEGATIVE 01/01/2017 0935   LEUKOCYTESUR NEGATIVE 01/01/2017 0935   Sepsis Labs: @LABRCNTIP (procalcitonin:4,lacticidven:4) ) Recent Results (from the past 240 hour(s))  MRSA PCR Screening     Status: Abnormal   Collection Time: 01/27/17  2:14 PM  Result Value Ref Range Status   MRSA by PCR POSITIVE (A) NEGATIVE Final    Comment:        The GeneXpert MRSA Assay (FDA approved for NASAL specimens only), is one component of a comprehensive MRSA colonization surveillance program. It is not intended to diagnose MRSA infection nor to guide or monitor treatment for MRSA infections. RESULT CALLED TO, READ BACK BY AND VERIFIED WITH: Micheline Rough RN 17:15 01/27/17 ([email or web address])      Radiological Exams on Admission: Dg Chest 2 View  Result Date: 02/04/2017 CLINICAL DATA:  Dyspnea.  History of asthma. EXAM: CHEST  2 VIEW COMPARISON:  01/29/2017 FINDINGS: The lungs are clear. The pulmonary vasculature is normal. Heart size is normal. Hilar and mediastinal contours are unremarkable. There is no pleural effusion. IMPRESSION: No active cardiopulmonary disease. Electronically Signed   By: Andreas Newport M.D.   On: 02/04/2017 03:09      Assessment/Plan Principal Problem:   Dyspnea/Asthma -Evaluated in the ER due to complaints of "wheezing" and dyspnea. -Recently discharged on 10/6 with recommendations to follow-up with pulmonary medicine-patient reports has appointment in place -Current wheezing symptoms appear to be isolated to upper airway-?? 2/2 forced respiratory effort -Patient does have a few expiratory crackles in the mid fields therefore will give one-time dose of IV Lasix (see below) -Given IV  Solu-Medrol (high  dose) in the ER and recommend continue prednisone 40 BID as prescribed at previous discharge -Recommend to continue home Dulera, Brovana, Pulmicort, Singulair and Claritin -We'll obtain ambulatory oximetry in the ER to determine if requires home O2 -Check peak flow pre-and post neb treatment while in ER -Check influenza PCR and respiratory viral panel-once discharged from ER these results can be called to patient. -Leukocytosis is secondary to steroid demargination and has actually improved since discharge-no evidence of pneumonia on chest x-ray -Suspect patient's obesity and neck/upper airway (posterior pharynx) anatomy contributing to her wheezing symptoms -She is also being treated with biologic agents through her pulmonology office with consideration given to changing to a new agent with decreased frequency of dosing to ensure compliance. Her respiratory allergens profile showed sensitivity to cockroach dust and D. farinae. She also was sensitive to rough pigweed. Her IgE was elevated in 2017. -Pulmonary medicine/Dr. decubitus evaluated the patient while in the ER and has determined she is appropriate for discharge to home and also recommend she keep her currently scheduled appointment with pulmonary medicine for 10/17 -Has Pulmonary appt 10/17 and has PCP appointment for 10/10 -She needs RF for albuterol MDI  Active Problems:   Hypertension, essential -Patient will be given her Norvasc while in the ER as well as her Cozaar -Presented with elevated blood pressure in the setting of acute respiratory symptoms    Sleep apnea/ Morbid obesity with BMI of 50.0-59.9, adult  -Patient admitted to noncompliance with CPAP overnight -Obesity contributing to difficulty in managing her respiratory symptoms    Chronic diastolic heart failure, NYHA class 1  -Patient does have some fine crackles on exam without peripheral edema -She has been on steroids and presented with elevated blood  pressure therefore could have some mild flash pulmonary edema not visible on chest x-ray -We'll hold her Lasix 40 mg PO dose today in favor of Lasix 40 mg IV 1 with her usual Aldactone and antihypertensive drugs to see if this improves her respiratory symptoms    Acute hyperglycemia -Glucose 273 in ER -Likely steroid induced -HbA1c was 5.8 in August-repeat today-result can be called to patient        Kemarion Abbey L. ANP-BC [facility] Pager [phone]   If 7PM-7AM, please contact night-coverage [email or web address] Password [email or web address]  02/04/2017, 8:33 AM

## 2017-02-04 NOTE — Discharge Instructions (Signed)
Please continue taking prednisone as previously prescribed. Please use albuterol as needed. Continue night time CPAP as directed. Followup with primary doctor as scheduled tomorrow. Return to ED for worsening symptoms.

## 2017-02-04 NOTE — ED Notes (Signed)
ED resident provider at bedside.

## 2017-02-04 NOTE — ED Notes (Signed)
admitting Provider at bedside. 

## 2017-02-04 NOTE — ED Triage Notes (Signed)
Pt states that she was recently admitted for asthma for 6 days, today wheezing became worse unrelived by home treatments, audible wheezing, stats 99

## 2017-02-04 NOTE — ED Provider Notes (Signed)
[facility] DEPT Provider Note   CSN: [id number] Arrival date & time: 02/04/17  0137     History   Chief Complaint Chief Complaint  Patient presents with  . Asthma    HPI April [name] is a 46 y.o. female.  Patient presents emergency department with chief complaint of wheezing. She is followed by Dr. Annamaria Boots of pulmonology. She states that her wheezing returned yesterday. She denies any fevers, chills, or productive cough. She was hospitalized last week for the same.  She denies any other associated symptoms. During her last hospitalization she had her medications changed, and she reports she has been compliant with her new regimen.   The history is provided by the patient. No language interpreter was used.    Past Medical History:  Diagnosis Date  . Acanthosis nigricans   . Arthritis   . Asthma    Followed by Dr. Annamaria Boots (pulmonology); receives every other week omalizumab injections; has frequent exacerbations  . COPD (chronic obstructive pulmonary disease) ([place])    PFTs in 2002, FEV1/FVC 65, no post bronchodilater test done  . Depression   . GERD (gastroesophageal reflux disease)   . Headache(784.0)   . Helicobacter pylori (H. pylori) infection   . Hypertension, essential   . Insomnia   . Menorrhagia   . Morbid obesity ([place])   . Obesity   . Seasonal allergies   . Shortness of breath   . Sleep apnea    Sleep study 2008 - mild OSA, not enough events to titrate CPAP  . Tobacco user     Patient Active Problem List   Diagnosis Date Noted  . Chronic diastolic CHF (congestive heart failure) ([place]) 01/17/2017  . SIRS (systemic inflammatory response syndrome) ([place]) 01/17/2017  . Depression 01/17/2017  . OSA (obstructive sleep apnea) 01/17/2017  . Asthma exacerbation 01/17/2017  . SOB (shortness of breath)   . Asthma exacerbation in COPD ([place]) 12/10/2016  . Moderate persistent asthma with exacerbation 10/19/2016  . Diarrhea 10/19/2016  . Normocytic anemia 10/02/2016    . Respiratory failure ([place]) 10/02/2016  . Hypertensive urgency, malignant 06/22/2016  . Pulmonary edema 06/22/2016  . Hypertensive urgency 06/22/2016  . Malignant hypertension due to primary aldosteronism ([place]) 05/05/2016  . Lip laceration 05/05/2016  . Elevated hemoglobin A1c 05/05/2016  . GERD (gastroesophageal reflux disease) 08/30/2015  . Anxiety and depression 08/30/2015  . Prediabetes 08/22/2015  . Seasonal allergic rhinitis 08/29/2013  . Leukocytosis 10/21/2012  . Tobacco abuse 10/07/2012  . Asthma, chronic obstructive, without status asthmaticus ([place]) 05/07/2012  . Knee pain, bilateral 04/25/2011  . Primary insomnia 03/14/2011  . Mild obstructive sleep apnea 12/19/2010  . Hypokalemia 08/13/2010  . Cervical back pain with evidence of disc disease 04/08/2008  . Essential hypertension 07/31/2006  . Morbid obesity with body mass index of 50.0-59.9 in adult ([place]) 06/17/2006  . Major depressive disorder, recurrent episode ([place]) 04/10/2006    Past Surgical History:  Procedure Laterality Date  . BREAST REDUCTION SURGERY  09/2011  . TUBAL LIGATION  1996   bilateral    OB History    No data available       Home Medications    Prior to Admission medications   Medication Sig Start Date End Date Taking? Authorizing Provider  albuterol (PROVENTIL HFA;VENTOLIN HFA) 108 (90 Base) MCG/ACT inhaler Inhale 2 puffs into the lungs every 4 (four) hours as needed for wheezing or shortness of breath. 01/23/17  Yes Montine Circle, PA-C  amLODipine (NORVASC) 10 MG tablet Take 1 tablet (  10 mg total) by mouth daily. 12/13/16  Yes Rai, Ripudeep K, MD  arformoterol (BROVANA) 15 MCG/2ML NEBU Take 2 mLs (15 mcg total) by nebulization 2 (two) times daily. 02/01/17  Yes Patrecia Pour, Christean Grief, MD  budesonide (PULMICORT) 0.25 MG/2ML nebulizer solution Take 2 mLs (0.25 mg total) by nebulization 2 (two) times daily. 01/02/17  Yes Patrecia Pour, Christean Grief, MD  butalbital-acetaminophen-caffeine (FIORICET, ESGIC)  [phone] MG tablet Take 1-2 tablets by mouth every 6 (six) hours as needed for headache. 12/19/16 12/19/17 Yes Joy, Shawn C, PA-C  fluticasone (FLONASE) 50 MCG/ACT nasal spray Place 2 sprays into both nostrils daily. 12/13/16  Yes Rai, Ripudeep K, MD  furosemide (LASIX) 40 MG tablet Take 1 tablet (40 mg total) by mouth daily. 01/17/17 01/17/18 Yes Short, Noah Delaine, MD  guaiFENesin (MUCINEX) 600 MG 12 hr tablet Take 1 tablet (600 mg total) by mouth 2 (two) times daily. 12/13/16  Yes Rai, Ripudeep K, MD  loratadine (CLARITIN) 10 MG tablet Take 1 tablet (10 mg total) by mouth daily. 08/30/15  Yes [name], MD  losartan (COZAAR) 100 MG tablet Take 1 tablet (100 mg total) by mouth daily. 12/13/16  Yes Rai, Ripudeep K, MD  metoprolol tartrate (LOPRESSOR) 50 MG tablet Take 1 tablet (50 mg total) by mouth 2 (two) times daily. 02/01/17  Yes Patrecia Pour, Christean Grief, MD  mometasone-formoterol [facility]) 200-5 MCG/ACT AERO Inhale 2 puffs into the lungs 2 (two) times daily. 01/06/17  Yes Young, Tarri Fuller D, MD  montelukast (SINGULAIR) 10 MG tablet Take 1 tablet (10 mg total) by mouth at bedtime. 01/02/17  Yes Patrecia Pour, Christean Grief, MD  nicotine (NICODERM CQ - DOSED IN MG/24 HOURS) 14 mg/24hr patch Place 1 patch (14 mg total) onto the skin daily. Patient taking differently: Place 14 mg onto the skin daily as needed (smoking cessation).  10/04/16  Yes Reyne Dumas, MD  omeprazole (PRILOSEC) 20 MG capsule Take 1 capsule (20 mg total) by mouth daily. Patient taking differently: Take 20 mg by mouth 2 (two) times daily.  10/09/16  Yes Young, Tarri Fuller D, MD  predniSONE (DELTASONE) 20 MG tablet Take 2 tablets (40 mg total) by mouth 2 (two) times daily with a meal. 02/01/17  Yes Patrecia Pour, Christean Grief, MD  sertraline (ZOLOFT) 100 MG tablet Take 1 tablet (100 mg total) by mouth daily. 12/27/16  Yes Arnoldo Morale, MD  spironolactone (ALDACTONE) 100 MG tablet Take 1 tablet (100 mg total) by mouth daily. 01/17/17  Yes Short, Noah Delaine, MD  zolpidem  (AMBIEN) 5 MG tablet Take 1 tablet (5 mg total) by mouth at bedtime as needed for sleep. 12/22/16  Yes Mikhail, Velta Addison, DO    Family History Family History  Problem Relation Age of Onset  . Hypertension Mother   . Asthma Daughter   . Cancer Paternal Aunt   . Asthma Maternal Grandmother     Social History Social History  Substance Use Topics  . Smoking status: Former Smoker    Packs/day: 0.50    Years: 18.00    Types: Cigarettes    Quit date: 04/29/2014  . Smokeless tobacco: Never Used  . Alcohol use No     Allergies   Patient has no known allergies.   Review of Systems Review of Systems  All other systems reviewed and are negative.    Physical Exam Updated Vital Signs BP (!) 163/76   Pulse 97   Temp 99.5 F (37.5 C) (Oral)   Resp 20   Ht 5\' 5"  (1.651 m)   Wt [name])  158.3 kg (349 lb)   SpO2 98%   BMI 58.08 kg/m   Physical Exam  Constitutional: She is oriented to person, place, and time. She appears well-developed and well-nourished.  HENT:  Head: Normocephalic and atraumatic.  Eyes: Pupils are equal, round, and reactive to light. Conjunctivae and EOM are normal.  Neck: Normal range of motion. Neck supple.  Cardiovascular: Normal rate and regular rhythm.  Exam reveals no gallop and no friction rub.   No murmur heard. Pulmonary/Chest: Effort normal. No respiratory distress. She has wheezes. She has no rales. She exhibits no tenderness.  Inspiratory and expiratory wheezes  Abdominal: Soft. Bowel sounds are normal. She exhibits no distension and no mass. There is no tenderness. There is no rebound and no guarding.  Musculoskeletal: Normal range of motion. She exhibits no edema or tenderness.  Neurological: She is alert and oriented to person, place, and time.  Skin: Skin is warm and dry.  Psychiatric: She has a normal mood and affect. Her behavior is normal. Judgment and thought content normal.  Nursing note and vitals reviewed.    ED Treatments / Results    Labs (all labs ordered are listed, but only abnormal results are displayed) Labs Reviewed  CBC WITH DIFFERENTIAL/PLATELET - Abnormal; Notable for the following:       Result Value   WBC 21.8 (*)    Hemoglobin 11.9 (*)    RDW 19.8 (*)    Platelets 409 (*)    Neutro Abs 20.7 (*)    All other components within normal limits  BASIC METABOLIC PANEL    EKG  EKG Interpretation None       Radiology Dg Chest 2 View  Result Date: 02/04/2017 CLINICAL DATA:  Dyspnea.  History of asthma. EXAM: CHEST  2 VIEW COMPARISON:  01/29/2017 FINDINGS: The lungs are clear. The pulmonary vasculature is normal. Heart size is normal. Hilar and mediastinal contours are unremarkable. There is no pleural effusion. IMPRESSION: No active cardiopulmonary disease. Electronically Signed   By: Andreas [place] M.D.   On: 02/04/2017 03:09    Procedures Procedures (including critical care time) CRITICAL CARE Performed by: Montine Circle   Total critical care time: 46 minutes  Critical care time was exclusive of separately billable procedures and treating other patients.  Critical care was necessary to treat or prevent imminent or life-threatening deterioration.  Critical care was time spent personally by me on the following activities: development of treatment plan with patient and/or surrogate as well as nursing, discussions with consultants, evaluation of patient's response to treatment, examination of patient, obtaining history from patient or surrogate, ordering and performing treatments and interventions, ordering and review of laboratory studies, ordering and review of radiographic studies, pulse oximetry and re-evaluation of patient's condition.  Medications Ordered in ED Medications  albuterol (PROVENTIL) (2.5 MG/3ML) 0.083% nebulizer solution (  Hold 02/04/17 0230)  albuterol (PROVENTIL,VENTOLIN) solution continuous neb (10 mg/hr Nebulization New Bag/Given 02/04/17 0617)  albuterol (PROVENTIL) (2.5  MG/3ML) 0.083% nebulizer solution 5 mg (5 mg Nebulization Given 02/04/17 0224)  ipratropium-albuterol (DUONEB) 0.5-2.5 (3) MG/3ML nebulizer solution 3 mL (3 mLs Nebulization Given 02/04/17 0548)  methylPREDNISolone sodium succinate (SOLU-MEDROL) 125 mg/2 mL injection 125 mg (125 mg Intravenous Given 02/04/17 0554)     Initial Impression / Assessment and Plan / ED Course  I have reviewed the triage vital signs and the nursing notes.  Pertinent labs & imaging results that were available during my care of the patient were reviewed by me and considered in  my medical decision making (see chart for details).     Patient with asthma exacerbation. Recently admitted for the same. Patient has required multiple breathing treatments in the emergency department. She has had some improvement, but is still significantly short of breath and wheezing. During her last hospitalization she had a CT PE study which was negative.    Patient currently receiving continuous albuterol treatment. She'll need to be reassessed, however it is likely that she will need admission.  Patient signed out to the day team for repeat evaluation after CAT.  Final Clinical Impressions(s) / ED Diagnoses   Final diagnoses:  None    New Prescriptions New Prescriptions   No medications on file     Montine Circle, Hershal Coria 02/04/17 2094    Orpah Greek, MD 02/04/17 [phone]

## 2017-02-04 NOTE — Telephone Encounter (Signed)
Met with the patient today. She wants to keep her appointment with Dr Jarold Song at [facility] tomorrow - 02/05/17 @ 1400.  She confirmed that she has transportation to the clinic.   She also noted that she has all of her medications including the brovana

## 2017-02-04 NOTE — Procedures (Signed)
Patient started on CAT per MD orders.  LS with diffuse expiratory wheezing.  Patient maintaining sats on RA prior to neb treatment starting.

## 2017-02-04 NOTE — Progress Notes (Signed)
RT to pt's room for peak flow pre/post HHN tx but medication not available. Pharmacy notified peak flow left at bedside for use when meds become available. RN aware. RT will continue to monitor.

## 2017-02-05 ENCOUNTER — Ambulatory Visit: Payer: Medicaid Other | Attending: Family Medicine | Admitting: Family Medicine

## 2017-02-05 ENCOUNTER — Encounter: Payer: Self-pay | Admitting: Family Medicine

## 2017-02-05 ENCOUNTER — Ambulatory Visit: Payer: Medicaid Other | Admitting: Licensed Clinical Social Worker

## 2017-02-05 ENCOUNTER — Ambulatory Visit: Payer: Medicaid Other | Admitting: Family Medicine

## 2017-02-05 VITALS — BP 149/79 | HR 73 | Temp 98.1°F | Ht 66.0 in | Wt 339.8 lb

## 2017-02-05 DIAGNOSIS — I110 Hypertensive heart disease with heart failure: Secondary | ICD-10-CM | POA: Diagnosis not present

## 2017-02-05 DIAGNOSIS — G4700 Insomnia, unspecified: Secondary | ICD-10-CM | POA: Diagnosis not present

## 2017-02-05 DIAGNOSIS — Z79899 Other long term (current) drug therapy: Secondary | ICD-10-CM | POA: Insufficient documentation

## 2017-02-05 DIAGNOSIS — J449 Chronic obstructive pulmonary disease, unspecified: Secondary | ICD-10-CM

## 2017-02-05 DIAGNOSIS — R7303 Prediabetes: Secondary | ICD-10-CM | POA: Diagnosis not present

## 2017-02-05 DIAGNOSIS — F329 Major depressive disorder, single episode, unspecified: Secondary | ICD-10-CM

## 2017-02-05 DIAGNOSIS — N920 Excessive and frequent menstruation with regular cycle: Secondary | ICD-10-CM | POA: Diagnosis not present

## 2017-02-05 DIAGNOSIS — G4733 Obstructive sleep apnea (adult) (pediatric): Secondary | ICD-10-CM

## 2017-02-05 DIAGNOSIS — J45909 Unspecified asthma, uncomplicated: Secondary | ICD-10-CM | POA: Diagnosis present

## 2017-02-05 DIAGNOSIS — I5032 Chronic diastolic (congestive) heart failure: Secondary | ICD-10-CM

## 2017-02-05 DIAGNOSIS — F32A Depression, unspecified: Secondary | ICD-10-CM

## 2017-02-05 DIAGNOSIS — I10 Essential (primary) hypertension: Secondary | ICD-10-CM | POA: Diagnosis not present

## 2017-02-05 DIAGNOSIS — J4489 Other specified chronic obstructive pulmonary disease: Secondary | ICD-10-CM

## 2017-02-05 DIAGNOSIS — K219 Gastro-esophageal reflux disease without esophagitis: Secondary | ICD-10-CM | POA: Diagnosis not present

## 2017-02-05 DIAGNOSIS — F419 Anxiety disorder, unspecified: Principal | ICD-10-CM

## 2017-02-05 DIAGNOSIS — J45901 Unspecified asthma with (acute) exacerbation: Secondary | ICD-10-CM | POA: Diagnosis not present

## 2017-02-05 MED ORDER — BUSPIRONE HCL 7.5 MG PO TABS: 7.5000 mg | ORAL_TABLET | Freq: Three times a day (TID) | ORAL | 3 refills | Status: DC

## 2017-02-05 MED ORDER — ARFORMOTEROL TARTRATE 15 MCG/2ML IN NEBU: 15.0000 ug | INHALATION_SOLUTION | Freq: Two times a day (BID) | RESPIRATORY_TRACT | 0 refills | Status: DC

## 2017-02-05 MED ORDER — METOPROLOL TARTRATE 100 MG PO TABS: 100.0000 mg | ORAL_TABLET | Freq: Two times a day (BID) | ORAL | 3 refills | Status: DC

## 2017-02-05 MED FILL — busPIRone HCL 7.5 MG TABS: 7.5 | 20 days supply | Qty: 60 | Fill #0

## 2017-02-05 MED FILL — METOPROLOL TARTRATE 100 MG: 100 | 30 days supply | Qty: 60 | Fill #0

## 2017-02-05 NOTE — Progress Notes (Signed)
Transitional care clinic  Subjective:  Patient ID: April [name], female    DOB: [birth year]  Age: 46 y.o. MRN: [id number]  CC: Asthma   HPI April [name] is a 46 year old female with a history of morbid obesity, major depressive disorder, hypertension, Pre Diabetes (A1c 5.8), Asthma (On Xolair injections every 2 weeks), poor compliance with inhalers, obstructive sleep apnea  with multiple hospitalizations for asthma exacerbation.  Her most recent hospitalization was from 01/26/17 through 02/01/17 where she was managed for asthma exacerbation; CT chest revealed no evidence of PE and she received IV Solu-Medrol and nebulizer medications with improvement in her oxygen saturation. Her metoprolol dose was increased for optimal blood pressure control and she was subsequently discharged on prednisone. Yesterday she presented to the ED again with wheezing, seen by pulmonary and no antibiotics indicated, recommendation was to continue with outpatient steroids and follow-up with pulmonary.  Today she informs me that while sleeping she suddenly developed shortness of breath, wheezing and "a feeling that death has come over her". This worsens her anxiety and increases her heart rate. She does have some shortness of breath and used her albuterol treatment one hour before her appointment today, her oxygen saturation is 98% in the clinic. She informs me she does not have a CPAP machine for sleep apnea. She is also yet to obtain her Brovana.    Past Medical History:  Diagnosis Date  . Acanthosis nigricans   . Arthritis   . Asthma    Followed by Dr. Annamaria Boots (pulmonology); receives every other week omalizumab injections; has frequent exacerbations  . COPD (chronic obstructive pulmonary disease) ([place])    PFTs in 2002, FEV1/FVC 65, no post bronchodilater test done  . Depression   . GERD (gastroesophageal reflux disease)   . Headache(784.0)   . Helicobacter pylori (H. pylori) infection   . Hypertension,  essential   . Insomnia   . Menorrhagia   . Morbid obesity ([place])   . Obesity   . Seasonal allergies   . Shortness of breath   . Sleep apnea    Sleep study 2008 - mild OSA, not enough events to titrate CPAP  . Tobacco user     Past Surgical History:  Procedure Laterality Date  . BREAST REDUCTION SURGERY  09/2011  . TUBAL LIGATION  1996   bilateral    No Known Allergies   Outpatient Medications Prior to Visit  Medication Sig Dispense Refill  . albuterol (PROVENTIL HFA;VENTOLIN HFA) 108 (90 Base) MCG/ACT inhaler Inhale 2 puffs into the lungs every 4 (four) hours as needed for wheezing or shortness of breath. 1 Inhaler 3  . amLODipine (NORVASC) 10 MG tablet Take 1 tablet (10 mg total) by mouth daily. 30 tablet 4  . budesonide (PULMICORT) 0.25 MG/2ML nebulizer solution Take 2 mLs (0.25 mg total) by nebulization 2 (two) times daily. 60 mL 0  . butalbital-acetaminophen-caffeine (FIORICET, ESGIC) 50-325-40 MG tablet Take 1-2 tablets by mouth every 6 (six) hours as needed for headache. 20 tablet 0  . fluticasone (FLONASE) 50 MCG/ACT nasal spray Place 2 sprays into both nostrils daily. 16 g 6  . furosemide (LASIX) 40 MG tablet Take 1 tablet (40 mg total) by mouth daily. 30 tablet 0  . guaiFENesin (MUCINEX) 600 MG 12 hr tablet Take 1 tablet (600 mg total) by mouth 2 (two) times daily. 30 tablet 0  . loratadine (CLARITIN) 10 MG tablet Take 1 tablet (10 mg total) by mouth daily. 90 tablet 3  . losartan (  COZAAR) 100 MG tablet Take 1 tablet (100 mg total) by mouth daily. 30 tablet 3  . mometasone-formoterol (DULERA) 200-5 MCG/ACT AERO Inhale 2 puffs into the lungs 2 (two) times daily. 1 Inhaler 0  . montelukast (SINGULAIR) 10 MG tablet Take 1 tablet (10 mg total) by mouth at bedtime. 30 tablet 0  . nicotine (NICODERM CQ - DOSED IN MG/24 HOURS) 14 mg/24hr patch Place 1 patch (14 mg total) onto the skin daily. (Patient taking differently: Place 14 mg onto the skin daily as needed (smoking  cessation). ) 28 patch 0  . omeprazole (PRILOSEC) 20 MG capsule Take 1 capsule (20 mg total) by mouth daily. (Patient taking differently: Take 20 mg by mouth 2 (two) times daily. ) 30 capsule 11  . predniSONE (DELTASONE) 20 MG tablet Take 2 tablets (40 mg total) by mouth 2 (two) times daily with a meal. 30 tablet 0  . sertraline (ZOLOFT) 100 MG tablet Take 1 tablet (100 mg total) by mouth daily. 60 tablet 3  . spironolactone (ALDACTONE) 100 MG tablet Take 1 tablet (100 mg total) by mouth daily. 30 tablet 0  . zolpidem (AMBIEN) 5 MG tablet Take 1 tablet (5 mg total) by mouth at bedtime as needed for sleep. 30 tablet 0  . metoprolol tartrate (LOPRESSOR) 50 MG tablet Take 1 tablet (50 mg total) by mouth 2 (two) times daily. 60 tablet 0  . arformoterol (BROVANA) 15 MCG/2ML NEBU Take 2 mLs (15 mcg total) by nebulization 2 (two) times daily. (Patient not taking: Reported on 02/05/2017) 120 mL 0   No facility-administered medications prior to visit.     ROS Review of Systems  Constitutional: Negative for activity change, appetite change and fatigue.  HENT: Negative for congestion, sinus pressure and sore throat.   Eyes: Negative for visual disturbance.  Respiratory: Positive for wheezing. Negative for cough, chest tightness and shortness of breath.   Cardiovascular: Negative for chest pain and palpitations.  Gastrointestinal: Negative for abdominal distention, abdominal pain and constipation.  Endocrine: Negative for polydipsia.  Genitourinary: Negative for dysuria and frequency.  Musculoskeletal: Negative for arthralgias and back pain.  Skin: Negative for rash.  Neurological: Negative for tremors, light-headedness and numbness.  Hematological: Does not bruise/bleed easily.  Psychiatric/Behavioral: Negative for agitation and behavioral problems.    Objective:  BP (!) 149/79   Pulse 73   Temp 98.1 F (36.7 C) (Oral)   Ht 5\' 6"  (1.676 m)   Wt (!) 339 lb 12.8 oz (154.1 kg)   LMP 01/25/2017    SpO2 98%   BMI 54.85 kg/m   BP/Weight 02/05/2017 02/04/2017 42/08/9561  Systolic BP 875 643 329  Diastolic BP 79 91 96  Wt. (Lbs) 339.8 349 349.43  BMI 54.85 58.08 58.15  Some encounter information is confidential and restricted. Go to Review Flowsheets activity to see all data.     Physical Exam  Constitutional: She is oriented to person, place, and time. She appears well-developed and well-nourished.  Morbidly obese  Cardiovascular: Normal rate, normal heart sounds and intact distal pulses.   No murmur heard. Pulmonary/Chest: Effort normal. She has wheezes (in all lung zones bilaterally). She has no rales. She exhibits no tenderness.  Abdominal: Soft. Bowel sounds are normal. She exhibits no distension and no mass. There is no tenderness.  Musculoskeletal: Normal range of motion.  Neurological: She is alert and oriented to person, place, and time.  Skin: Skin is warm and dry.  Psychiatric: She has a normal mood and affect.  Assessment & Plan:   1. Asthma, chronic obstructive, without status asthmaticus ([place]) Despite wheezing on exam she does not appear to be in respiratory distress and oxygen saturation is 98% Given she just administered a nebulizer treatment one hour prior to her visit with me I will hold off on administering an nebulizer solution in the clinic. Continue prednisone We'll check with the pharmacy regarding her [place]. Keep appointment with pulmonary - arformoterol (BROVANA) 15 MCG/2ML NEBU; Take 2 mLs (15 mcg total) by nebulization 2 (two) times daily.  Dispense: 120 mL; Refill: 0  2. Chronic diastolic CHF (congestive heart failure) (HCC) EF 55-60% from 2-D echo 09/2016 Euvolemic  3. OSA (obstructive sleep apnea) Not using a CPAP machine We'll get with the case manager regarding arranging for this for her through the DME company  4. Anxiety and depression Continue Prozac Added Buspar as underlying anxiety could worsen her asthma exacerbation -  busPIRone (BUSPAR) 7.5 MG tablet; Take 1 tablet (7.5 mg total) by mouth 3 (three) times daily.  Dispense: 60 tablet; Refill: 3  5. Hypertension, essential Uncontrolled Increase dose of metoprolol - metoprolol tartrate (LOPRESSOR) 100 MG tablet; Take 1 tablet (100 mg total) by mouth 2 (two) times daily.  Dispense: 60 tablet; Refill: 3   Meds ordered this encounter  Medications  . arformoterol (BROVANA) 15 MCG/2ML NEBU    Sig: Take 2 mLs (15 mcg total) by nebulization 2 (two) times daily.    Dispense:  120 mL    Refill:  0  . metoprolol tartrate (LOPRESSOR) 100 MG tablet    Sig: Take 1 tablet (100 mg total) by mouth 2 (two) times daily.    Dispense:  60 tablet    Refill:  3    Discontinue previous dose  . busPIRone (BUSPAR) 7.5 MG tablet    Sig: Take 1 tablet (7.5 mg total) by mouth 3 (three) times daily.    Dispense:  60 tablet    Refill:  3    Follow-up: Return in about 2 weeks (around 02/19/2017) for TCC - follow-up of asthma.   Arnoldo Morale MD

## 2017-02-05 NOTE — Patient Instructions (Signed)

## 2017-02-06 ENCOUNTER — Encounter: Payer: Self-pay | Admitting: Pharmacist

## 2017-02-06 ENCOUNTER — Telehealth: Payer: Self-pay | Admitting: Family Medicine

## 2017-02-06 NOTE — Progress Notes (Signed)
Prior authorization request received for Brovana. Completed and approved through [facility] Medicaid. Approval #[id number]

## 2017-02-06 NOTE — BH Specialist Note (Signed)
Integrated Behavioral Health Follow Up Visit  MRN: [id number] Name: April [name]  Number of [place] Clinician visits: 3/6 Session Start time: 3:00 PM  Session End time: 3:20 PM Total time: 20 minutes  Type of Service: [place] Interpretor:No. Interpretor Name and Language: N/A  SUBJECTIVE: April [name] is a 46 y.o. female accompanied by self Patient was referred by Dr. Jarold Song for anxiety and depression. Patient reports the following symptoms/concerns: overwhelming feelings of sadness and worry, difficulty sleeping, racing thoughts, low motivation, withdrawn behavior, and difficulty concentrating Duration of problem: Ongoing; Severity of problem: severe  OBJECTIVE: Mood: Dysphoric and Affect: Blunt Risk of harm to self or others: No plan to harm self or others  LIFE CONTEXT: Family and Social: Pt receives support from adult daughters, boyfriend, and mother. She is close to her grandchildren School/Work: Pt is unemployed. She receives food stamps [phone]) and has applied for disability (pending hearing date) Self-Care: Pt participates in medication management.  Life Changes: Pt has ongoing medical concerns and financial strain  GOALS ADDRESSED: Patient will: 1.  Reduce symptoms of: anxiety, depression and stress  2.  Increase knowledge and/or ability of: coping skills and healthy habits  3.  Demonstrate ability to: Increase adequate support systems for patient/family and Improve medication compliance  INTERVENTIONS: Interventions utilized:  Solution-Focused Strategies and Supportive Counseling Standardized Assessments completed: GAD-7 and PHQ 2&9  ASSESSMENT: Patient currently experiencing anxiety and depression triggered by ongoing medical concerns and financial strain. Pt reported that she has improved medication compliance since last visit and has resolved family conflict between she and her adult daughter. Denied  SI/HI/AVH.  Patient may benefit from psychotherapy to manage anxiety and depression. She reports utilizing healthy coping skills to decrease anxiety symptoms. LCSWA provided grounding interventions to assist pt in decreasing symptoms. LCSWA provided resources for food instability, utility assistance, and furniture per pt's request.  PLAN: 1. Follow up with behavioral health clinician on : Pt was encouraged tocontact LCSWA if symptoms worsen or fail to improveto schedule behavioral appointments at [facility]. 2. Behavioral recommendations: LCSWA recommends that pt apply healthy coping skills discussed, comply with medication management, and utilize provided resources. Pt is encouraged to schedule follow up appointment with LCSWA 3. Referral(s): [place] (LME/Outside Clinic) 4. "From scale of 1-10, how likely are you to follow plan?": 7/10  Rebekah [place]field, LCSW 02/06/17 2:33 PM

## 2017-02-06 NOTE — Telephone Encounter (Signed)
Spoke to patient in reference to call she placed.  She states she took all medications: inhalers, breathing treatments, spironolactone, prednisone, buspar. She states she is lying down. Encouraged patient to sit up to help her to  breath better. Informed pt that the prior authorization for [name] was submitted and approved today.She states she has been wheezing since she left the hospital.   Pt denies chest pain or dizziness.   No cough or dyspnea, SOB assessed when speaking to her. Pt speaks in full sentences. She states she was going to [facility] if the weather clears up or would call EMS To take her.

## 2017-02-06 NOTE — Telephone Encounter (Signed)
Pt called to speak with the nurse since she feel very bad , I hard for her to breath, please follow up

## 2017-02-07 ENCOUNTER — Telehealth: Payer: Self-pay

## 2017-02-07 NOTE — Telephone Encounter (Signed)
Patient did not answer phone. Left message on voicemail in reference to [facility]. Instructed to return call if she had any questions.

## 2017-02-07 NOTE — Telephone Encounter (Signed)
Call placed to [facility] to check on the status of her CPAP order. Spoke to Amy who stated that they received the order on 01/07/17 from Dr Annamaria Boots and have not been able to reach the patient and sent a non -contact letter on 01/17/17. Demographics were reviewed and it was noted that they may have been calling the wrong #.  Amy stated that they would try to contact her again at # [phone].

## 2017-02-07 NOTE — Telephone Encounter (Signed)
Please encourage her to pick up the Brovana from the pharmacy and use as prescribed as she tends to have symptoms when she runs out of her medications. Thanks

## 2017-02-10 ENCOUNTER — Telehealth: Payer: Self-pay

## 2017-02-10 ENCOUNTER — Emergency Department (HOSPITAL_COMMUNITY): Payer: Medicaid Other

## 2017-02-10 ENCOUNTER — Inpatient Hospital Stay (HOSPITAL_COMMUNITY)
Admission: EM | Admit: 2017-02-10 | Discharge: 2017-02-14 | DRG: 190 | Discharge status: Against Medical Advice | Payer: Medicaid Other | Attending: Internal Medicine | Admitting: Internal Medicine

## 2017-02-10 ENCOUNTER — Other Ambulatory Visit: Payer: Self-pay

## 2017-02-10 DIAGNOSIS — E876 Hypokalemia: Secondary | ICD-10-CM | POA: Diagnosis not present

## 2017-02-10 DIAGNOSIS — I110 Hypertensive heart disease with heart failure: Secondary | ICD-10-CM | POA: Diagnosis present

## 2017-02-10 DIAGNOSIS — M1990 Unspecified osteoarthritis, unspecified site: Secondary | ICD-10-CM | POA: Diagnosis present

## 2017-02-10 DIAGNOSIS — K219 Gastro-esophageal reflux disease without esophagitis: Secondary | ICD-10-CM | POA: Diagnosis present

## 2017-02-10 DIAGNOSIS — Z7952 Long term (current) use of systemic steroids: Secondary | ICD-10-CM | POA: Diagnosis not present

## 2017-02-10 DIAGNOSIS — J9601 Acute respiratory failure with hypoxia: Secondary | ICD-10-CM | POA: Diagnosis present

## 2017-02-10 DIAGNOSIS — J4551 Severe persistent asthma with (acute) exacerbation: Secondary | ICD-10-CM

## 2017-02-10 DIAGNOSIS — Z79899 Other long term (current) drug therapy: Secondary | ICD-10-CM

## 2017-02-10 DIAGNOSIS — F329 Major depressive disorder, single episode, unspecified: Secondary | ICD-10-CM | POA: Diagnosis present

## 2017-02-10 DIAGNOSIS — I5032 Chronic diastolic (congestive) heart failure: Secondary | ICD-10-CM | POA: Diagnosis present

## 2017-02-10 DIAGNOSIS — Z7951 Long term (current) use of inhaled steroids: Secondary | ICD-10-CM

## 2017-02-10 DIAGNOSIS — Z6841 Body Mass Index (BMI) 40.0 and over, adult: Secondary | ICD-10-CM

## 2017-02-10 DIAGNOSIS — Z9114 Patient's other noncompliance with medication regimen: Secondary | ICD-10-CM

## 2017-02-10 DIAGNOSIS — J302 Other seasonal allergic rhinitis: Secondary | ICD-10-CM | POA: Diagnosis present

## 2017-02-10 DIAGNOSIS — X58XXXA Exposure to other specified factors, initial encounter: Secondary | ICD-10-CM | POA: Diagnosis not present

## 2017-02-10 DIAGNOSIS — Z825 Family history of asthma and other chronic lower respiratory diseases: Secondary | ICD-10-CM | POA: Diagnosis not present

## 2017-02-10 DIAGNOSIS — J020 Streptococcal pharyngitis: Secondary | ICD-10-CM | POA: Diagnosis present

## 2017-02-10 DIAGNOSIS — D72829 Elevated white blood cell count, unspecified: Secondary | ICD-10-CM | POA: Diagnosis not present

## 2017-02-10 DIAGNOSIS — J441 Chronic obstructive pulmonary disease with (acute) exacerbation: Secondary | ICD-10-CM | POA: Diagnosis not present

## 2017-02-10 DIAGNOSIS — J449 Chronic obstructive pulmonary disease, unspecified: Secondary | ICD-10-CM | POA: Diagnosis not present

## 2017-02-10 DIAGNOSIS — Z9989 Dependence on other enabling machines and devices: Secondary | ICD-10-CM | POA: Diagnosis not present

## 2017-02-10 DIAGNOSIS — G4733 Obstructive sleep apnea (adult) (pediatric): Secondary | ICD-10-CM | POA: Diagnosis present

## 2017-02-10 DIAGNOSIS — G4700 Insomnia, unspecified: Secondary | ICD-10-CM | POA: Diagnosis present

## 2017-02-10 DIAGNOSIS — Z87891 Personal history of nicotine dependence: Secondary | ICD-10-CM | POA: Diagnosis not present

## 2017-02-10 DIAGNOSIS — J45901 Unspecified asthma with (acute) exacerbation: Secondary | ICD-10-CM | POA: Diagnosis not present

## 2017-02-10 DIAGNOSIS — B9789 Other viral agents as the cause of diseases classified elsewhere: Secondary | ICD-10-CM | POA: Diagnosis present

## 2017-02-10 DIAGNOSIS — Z8249 Family history of ischemic heart disease and other diseases of the circulatory system: Secondary | ICD-10-CM | POA: Diagnosis not present

## 2017-02-10 DIAGNOSIS — T380X5A Adverse effect of glucocorticoids and synthetic analogues, initial encounter: Secondary | ICD-10-CM | POA: Diagnosis not present

## 2017-02-10 DIAGNOSIS — R0602 Shortness of breath: Secondary | ICD-10-CM | POA: Diagnosis present

## 2017-02-10 DIAGNOSIS — I10 Essential (primary) hypertension: Secondary | ICD-10-CM | POA: Diagnosis not present

## 2017-02-10 LAB — CBC WITH DIFFERENTIAL/PLATELET
Basophils Absolute: 0 10*3/uL (ref 0.0–0.1)
Basophils Relative: 0 %
Eosinophils Absolute: 0 10*3/uL (ref 0.0–0.7)
Eosinophils Relative: 0 %
HCT: 37.4 % (ref 36.0–46.0)
Hemoglobin: 11.8 g/dL — ABNORMAL LOW (ref 12.0–15.0)
Lymphocytes Relative: 16 %
Lymphs Abs: 4.3 10*3/uL — ABNORMAL HIGH (ref 0.7–4.0)
MCH: 28 pg (ref 26.0–34.0)
MCHC: 31.6 g/dL (ref 30.0–36.0)
MCV: 88.8 fL (ref 78.0–100.0)
Monocytes Absolute: 2.7 10*3/uL — ABNORMAL HIGH (ref 0.1–1.0)
Monocytes Relative: 10 %
Neutro Abs: 20 10*3/uL — ABNORMAL HIGH (ref 1.7–7.7)
Neutrophils Relative %: 74 %
Platelets: 294 10*3/uL (ref 150–400)
RBC: 4.21 MIL/uL (ref 3.87–5.11)
RDW: 19.6 % — ABNORMAL HIGH (ref 11.5–15.5)
WBC: 27 10*3/uL — ABNORMAL HIGH (ref 4.0–10.5)

## 2017-02-10 LAB — BASIC METABOLIC PANEL
Anion gap: 10 (ref 5–15)
BUN: 7 mg/dL (ref 6–20)
CO2: 29 mmol/L (ref 22–32)
Calcium: 8.5 mg/dL — ABNORMAL LOW (ref 8.9–10.3)
Chloride: 100 mmol/L — ABNORMAL LOW (ref 101–111)
Creatinine, Ser: 0.77 mg/dL (ref 0.44–1.00)
GFR calc Af Amer: >60 mL/min (ref >60)
GFR calc non Af Amer: >60 mL/min (ref >60)
Glucose, Bld: 144 mg/dL — ABNORMAL HIGH (ref 65–99)
Potassium: 3 mmol/L — ABNORMAL LOW (ref 3.5–5.1)
Sodium: 139 mmol/L (ref 135–145)

## 2017-02-10 MED ORDER — ALBUTEROL (5 MG/ML) CONTINUOUS INHALATION SOLN
10.0000 mg/h | INHALATION_SOLUTION | Freq: Once | RESPIRATORY_TRACT | Status: AC
  Administered 2017-02-10: 10 mg/h via RESPIRATORY_TRACT

## 2017-02-10 MED ORDER — ALBUTEROL (5 MG/ML) CONTINUOUS INHALATION SOLN
15.0000 mg/h | INHALATION_SOLUTION | RESPIRATORY_TRACT | Status: AC
  Administered 2017-02-10: 15 mg/h via RESPIRATORY_TRACT
  Filled 2017-02-10: qty 20

## 2017-02-10 MED ORDER — ALBUTEROL SULFATE (2.5 MG/3ML) 0.083% IN NEBU
5.0000 mg | INHALATION_SOLUTION | Freq: Once | RESPIRATORY_TRACT | Status: AC
  Administered 2017-02-10: 5 mg via RESPIRATORY_TRACT

## 2017-02-10 MED ORDER — ALBUTEROL SULFATE (2.5 MG/3ML) 0.083% IN NEBU
INHALATION_SOLUTION | RESPIRATORY_TRACT | Status: AC
  Filled 2017-02-10: qty 12

## 2017-02-10 MED ORDER — ALBUTEROL SULFATE (2.5 MG/3ML) 0.083% IN NEBU
INHALATION_SOLUTION | RESPIRATORY_TRACT | Status: AC
  Filled 2017-02-10: qty 6

## 2017-02-10 MED ORDER — METHYLPREDNISOLONE SODIUM SUCC 125 MG IJ SOLR
125.0000 mg | Freq: Once | INTRAMUSCULAR | Status: AC
  Administered 2017-02-10: 125 mg via INTRAVENOUS
  Filled 2017-02-10: qty 2

## 2017-02-10 NOTE — ED Notes (Signed)
Pt provided sandwich and beverage.

## 2017-02-10 NOTE — Telephone Encounter (Signed)
Call received from the patient.  She said that she thinks she may be going to the ED because she is having difficulty breathing. She noted that she has not been able to sleep and has been doing her breathing treatments but is no better. She said that she has been taking all of her medications as ordered, including the brovana but does not feel any better than when she left the hospital. She said that she left a message for Dr Annamaria Boots and is waiting for a call back.   Informed her that if she does not feel any better and is having difficulty breathing, she should go to the ED and she stated that she understood.  Also informed her that Dr Jarold Song would be notified.   Call placed to the patient and informed her that Dr Jarold Song was notified of her status and that she should go to the ED if she is having difficulty breathing.

## 2017-02-10 NOTE — ED Notes (Signed)
IV attempt x 2, pt has very difficult time remaining still with IV attempts and requests IV team for any further attempts.  Pt on CAT

## 2017-02-10 NOTE — ED Notes (Signed)
Pt has continued SOB, Charge RN aware, pt to get next room

## 2017-02-10 NOTE — ED Notes (Signed)
April Punt, MD notified re: SOB, pt gives verbal order for hour long neb, RT aware

## 2017-02-10 NOTE — ED Notes (Signed)
Pt medicated per MD order, CAT completed

## 2017-02-10 NOTE — ED Notes (Signed)
Pt requesting coke and graham crackers

## 2017-02-10 NOTE — ED Triage Notes (Signed)
Pt c/o SOB with asthma worsening the last 2 weeks and last night had significant SOB, pt denies relief with breathing treatments and inhalers, pt takes Prednisone 40 mg a this time, pt has expiratory wheezes, A&O x4

## 2017-02-10 NOTE — ED Provider Notes (Signed)
[facility] EMERGENCY DEPARTMENT Provider Note   CSN: [id number] Arrival date & time: 02/10/17  1517     History   Chief Complaint Chief Complaint  Patient presents with  . Asthma    HPI April [name] is a 46 y.o. female.  HPI Patient presents to the emergency department with an exacerbation of her asthma.  Patient states that she was recently admitted to the hospital and seen one week ago and feels like she has not improved since that time.  Patient's is like her symptoms get worse.  She states that she has been taking her medications as prescribed. The patient denies chest pain,  headache,blurred vision, neck pain, fever, cough, weakness, numbness, dizziness, anorexia, edema, abdominal pain, nausea, vomiting, diarrhea, rash, back pain, dysuria, hematemesis, bloody stool, near syncope, or syncope. Past Medical History:  Diagnosis Date  . Acanthosis nigricans   . Arthritis   . Asthma    Followed by Dr. Annamaria Boots (pulmonology); receives every other week omalizumab injections; has frequent exacerbations  . COPD (chronic obstructive pulmonary disease) ([place])    PFTs in 2002, FEV1/FVC 65, no post bronchodilater test done  . Depression   . GERD (gastroesophageal reflux disease)   . Headache(784.0)   . Helicobacter pylori (H. pylori) infection   . Hypertension, essential   . Insomnia   . Menorrhagia   . Morbid obesity ([place])   . Obesity   . Seasonal allergies   . Shortness of breath   . Sleep apnea    Sleep study 2008 - mild OSA, not enough events to titrate CPAP  . Tobacco user     Patient Active Problem List   Diagnosis Date Noted  . Dyspnea 02/04/2017  . Asthma 02/04/2017  . Hypertension, essential 02/04/2017  . Sleep apnea 02/04/2017  . Morbid obesity with BMI of 50.0-59.9, adult ([place]) 02/04/2017  . Chronic diastolic heart failure, NYHA class 1 ([place]) 02/04/2017  . Acute hyperglycemia 02/04/2017  . Chronic diastolic CHF (congestive heart failure) ([place])  01/17/2017  . SIRS (systemic inflammatory response syndrome) ([place]) 01/17/2017  . Depression 01/17/2017  . OSA (obstructive sleep apnea) 01/17/2017  . Asthma exacerbation 01/17/2017  . SOB (shortness of breath)   . Asthma exacerbation in COPD ([place]) 12/10/2016  . Moderate persistent asthma with exacerbation 10/19/2016  . Diarrhea 10/19/2016  . Normocytic anemia 10/02/2016  . Respiratory failure ([place]) 10/02/2016  . Hypertensive urgency, malignant 06/22/2016  . Pulmonary edema 06/22/2016  . Hypertensive urgency 06/22/2016  . Malignant hypertension due to primary aldosteronism ([place]) 05/05/2016  . Lip laceration 05/05/2016  . Elevated hemoglobin A1c 05/05/2016  . GERD (gastroesophageal reflux disease) 08/30/2015  . Anxiety and depression 08/30/2015  . Prediabetes 08/22/2015  . Seasonal allergic rhinitis 08/29/2013  . Leukocytosis 10/21/2012  . Tobacco abuse 10/07/2012  . Asthma, chronic obstructive, without status asthmaticus ([place]) 05/07/2012  . Knee pain, bilateral 04/25/2011  . Primary insomnia 03/14/2011  . Mild obstructive sleep apnea 12/19/2010  . Hypokalemia 08/13/2010  . Cervical back pain with evidence of disc disease 04/08/2008  . Essential hypertension 07/31/2006  . Morbid obesity with body mass index of 50.0-59.9 in adult ([place]) 06/17/2006  . Major depressive disorder, recurrent episode ([place]) 04/10/2006    Past Surgical History:  Procedure Laterality Date  . BREAST REDUCTION SURGERY  09/2011  . TUBAL LIGATION  1996   bilateral    OB History    No data available       Home Medications    Prior to  Admission medications   Medication Sig Start Date End Date Taking? Authorizing Provider  albuterol (PROVENTIL HFA;VENTOLIN HFA) 108 (90 Base) MCG/ACT inhaler Inhale 2 puffs into the lungs every 4 (four) hours as needed for wheezing or shortness of breath. 02/04/17  Yes Mu, Pilar Plate, MD  amLODipine (NORVASC) 10 MG tablet Take 1 tablet (10 mg total) by mouth daily. 12/13/16   Yes Rai, Ripudeep K, MD  arformoterol (BROVANA) 15 MCG/2ML NEBU Take 2 mLs (15 mcg total) by nebulization 2 (two) times daily. 02/05/17  Yes Amao, Charlane Ferretti, MD  budesonide (PULMICORT) 0.25 MG/2ML nebulizer solution Take 2 mLs (0.25 mg total) by nebulization 2 (two) times daily. 01/02/17  Yes Patrecia Pour, Christean Grief, MD  busPIRone (BUSPAR) 7.5 MG tablet Take 1 tablet (7.5 mg total) by mouth 3 (three) times daily. 02/05/17  Yes Arnoldo Morale, MD  butalbital-acetaminophen-caffeine (FIORICET, ESGIC) 50-325-40 MG tablet Take 1-2 tablets by mouth every 6 (six) hours as needed for headache. 12/19/16 12/19/17 Yes Joy, Shawn C, PA-C  fluticasone (FLONASE) 50 MCG/ACT nasal spray Place 2 sprays into both nostrils daily. 12/13/16  Yes Rai, Ripudeep K, MD  furosemide (LASIX) 40 MG tablet Take 1 tablet (40 mg total) by mouth daily. 01/17/17 01/17/18 Yes Short, Noah Delaine, MD  guaiFENesin (MUCINEX) 600 MG 12 hr tablet Take 1 tablet (600 mg total) by mouth 2 (two) times daily. 12/13/16  Yes Rai, Ripudeep K, MD  loratadine (CLARITIN) 10 MG tablet Take 1 tablet (10 mg total) by mouth daily. 08/30/15  Yes [name], MD  losartan (COZAAR) 100 MG tablet Take 1 tablet (100 mg total) by mouth daily. 12/13/16  Yes Rai, Ripudeep K, MD  metoprolol tartrate (LOPRESSOR) 100 MG tablet Take 1 tablet (100 mg total) by mouth 2 (two) times daily. 02/05/17  Yes Amao, Charlane Ferretti, MD  mometasone-formoterol (DULERA) 200-5 MCG/ACT AERO Inhale 2 puffs into the lungs 2 (two) times daily. 01/06/17  Yes Young, Tarri Fuller D, MD  montelukast (SINGULAIR) 10 MG tablet Take 1 tablet (10 mg total) by mouth at bedtime. 01/02/17  Yes Patrecia Pour, Christean Grief, MD  nicotine (NICODERM CQ - DOSED IN MG/24 HOURS) 14 mg/24hr patch Place 1 patch (14 mg total) onto the skin daily. Patient taking differently: Place 14 mg onto the skin daily as needed (smoking cessation).  10/04/16  Yes Reyne Dumas, MD  omeprazole (PRILOSEC) 20 MG capsule Take 1 capsule (20 mg total) by mouth  daily. Patient taking differently: Take 20 mg by mouth 2 (two) times daily.  10/09/16  Yes Young, Tarri Fuller D, MD  predniSONE (DELTASONE) 20 MG tablet Take 2 tablets (40 mg total) by mouth 2 (two) times daily with a meal. 02/01/17  Yes Patrecia Pour, Christean Grief, MD  sertraline (ZOLOFT) 100 MG tablet Take 1 tablet (100 mg total) by mouth daily. 12/27/16  Yes Arnoldo Morale, MD  spironolactone (ALDACTONE) 100 MG tablet Take 1 tablet (100 mg total) by mouth daily. 01/17/17  Yes Short, Noah Delaine, MD  zolpidem (AMBIEN) 5 MG tablet Take 1 tablet (5 mg total) by mouth at bedtime as needed for sleep. 12/22/16  Yes Mikhail, Velta Addison, DO    Family History Family History  Problem Relation Age of Onset  . Hypertension Mother   . Asthma Daughter   . Cancer Paternal Aunt   . Asthma Maternal Grandmother     Social History Social History  Substance Use Topics  . Smoking status: Former Smoker    Packs/day: 0.50    Years: 18.00    Types: Cigarettes    Quit  date: 04/29/2014  . Smokeless tobacco: Never Used  . Alcohol use No     Allergies   Patient has no known allergies.   Review of Systems Review of Systems All other systems negative except as documented in the HPI. All pertinent positives and negatives as reviewed in the HPI.  Physical Exam Updated Vital Signs BP (!) 186/88   Pulse (!) 108   Temp 98.2 F (36.8 C) (Oral)   Resp (!) 25   Ht 5\' 6"  (1.676 m)   Wt (!) 145.2 kg (320 lb)   LMP 01/25/2017   SpO2 99%   BMI 51.65 kg/m   Physical Exam  Constitutional: She is oriented to person, place, and time. She appears well-developed and well-nourished. No distress.  HENT:  Head: Normocephalic and atraumatic.  Mouth/Throat: Oropharynx is clear and moist.  Eyes: Pupils are equal, round, and reactive to light.  Neck: Normal range of motion. Neck supple.  Cardiovascular: Normal rate, regular rhythm and normal heart sounds.  Exam reveals no gallop and no friction rub.   No murmur  heard. Pulmonary/Chest: Effort normal. No respiratory distress. She has wheezes in the right upper field, the right middle field, the right lower field, the left upper field, the left middle field and the left lower field. She has no rales. She exhibits no tenderness.  Abdominal: Soft. Bowel sounds are normal. She exhibits no distension. There is no tenderness.  Neurological: She is alert and oriented to person, place, and time. She exhibits normal muscle tone. Coordination normal.  Skin: Skin is warm and dry. Capillary refill takes less than 2 seconds. No rash noted. No erythema.  Psychiatric: She has a normal mood and affect. Her behavior is normal.  Nursing note and vitals reviewed.    ED Treatments / Results  Labs (all labs ordered are listed, but only abnormal results are displayed) Labs Reviewed  CBC WITH DIFFERENTIAL/PLATELET - Abnormal; Notable for the following:       Result Value   WBC 27.0 (*)    Hemoglobin 11.8 (*)    RDW 19.6 (*)    Neutro Abs 20.0 (*)    Lymphs Abs 4.3 (*)    Monocytes Absolute 2.7 (*)    All other components within normal limits  BASIC METABOLIC PANEL - Abnormal; Notable for the following:    Potassium 3.0 (*)    Chloride 100 (*)    Glucose, Bld 144 (*)    Calcium 8.5 (*)    All other components within normal limits    EKG  EKG Interpretation None       Radiology Dg Chest 2 View  Result Date: 02/10/2017 CLINICAL DATA:  Increasing shortness of Breath EXAM: CHEST  2 VIEW COMPARISON:  02/04/2017 FINDINGS: The heart size and mediastinal contours are within normal limits. Both lungs are clear. The visualized skeletal structures are unremarkable. IMPRESSION: No active cardiopulmonary disease. Electronically Signed   By: Inez Catalina M.D.   On: 02/10/2017 16:14    Procedures Procedures (including critical care time)  Medications Ordered in ED Medications  albuterol (PROVENTIL,VENTOLIN) solution continuous neb (15 mg/hr Nebulization New  Bag/Given 02/10/17 1923)  albuterol (PROVENTIL) (2.5 MG/3ML) 0.083% nebulizer solution 5 mg (5 mg Nebulization Given 02/10/17 1542)  albuterol (PROVENTIL,VENTOLIN) solution continuous neb (10 mg/hr Nebulization Given 02/10/17 1619)  methylPREDNISolone sodium succinate (SOLU-MEDROL) 125 mg/2 mL injection 125 mg (125 mg Intravenous Given 02/10/17 2043)     Initial Impression / Assessment and Plan / ED Course  I have  reviewed the triage vital signs and the nursing notes.  Pertinent labs & imaging results that were available during my care of the patient were reviewed by me and considered in my medical decision making (see chart for details).    Patient given an hour-long neb treatment on steroids and did not see significant improvement in her symptoms.  Patient will need to be admitted to the hospital for further evaluation and care.  I spoke with the [facility].who will admit the patient   CRITICAL CARE Performed by: Brent General Total critical care time:35 minutes Critical care time was exclusive of separately billable procedures and treating other patients. Critical care was necessary to treat or prevent imminent or life-threatening deterioration. Critical care was time spent personally by me on the following activities: development of treatment plan with patient and/or surrogate as well as nursing, discussions with consultants, evaluation of patient's response to treatment, examination of patient, obtaining history from patient or surrogate, ordering and performing treatments and interventions, ordering and review of laboratory studies, ordering and review of radiographic studies, pulse oximetry and re-evaluation of patient's condition.  Patient required an hour-long neb treatment with close monitoring.  Final Clinical Impressions(s) / ED Diagnoses   Final diagnoses:  Severe persistent asthma with exacerbation    New Prescriptions New Prescriptions   No medications on  file     Dalia Heading, Hershal Coria 02/12/17 0107    Dorie Rank, MD 02/12/17 [phone]

## 2017-02-11 ENCOUNTER — Encounter (HOSPITAL_COMMUNITY): Payer: Self-pay | Admitting: Internal Medicine

## 2017-02-11 DIAGNOSIS — J441 Chronic obstructive pulmonary disease with (acute) exacerbation: Principal | ICD-10-CM

## 2017-02-11 DIAGNOSIS — J45901 Unspecified asthma with (acute) exacerbation: Secondary | ICD-10-CM

## 2017-02-11 DIAGNOSIS — J9601 Acute respiratory failure with hypoxia: Secondary | ICD-10-CM

## 2017-02-11 LAB — BASIC METABOLIC PANEL
Anion gap: 15 (ref 5–15)
BUN: 10 mg/dL (ref 6–20)
CALCIUM: 8.5 mg/dL — AB (ref 8.9–10.3)
CO2: 22 mmol/L (ref 22–32)
CREATININE: 0.84 mg/dL (ref 0.44–1.00)
Chloride: 98 mmol/L — ABNORMAL LOW (ref 101–111)
GFR calc non Af Amer: >60 mL/min (ref >60)
Glucose, Bld: 250 mg/dL — ABNORMAL HIGH (ref 65–99)
Potassium: 3.6 mmol/L (ref 3.5–5.1)
SODIUM: 135 mmol/L (ref 135–145)

## 2017-02-11 LAB — TROPONIN I: Troponin I: <0.03 ng/mL (ref <0.03)

## 2017-02-11 LAB — CBC
HCT: 38.8 % (ref 36.0–46.0)
HEMATOCRIT: 39.1 % (ref 36.0–46.0)
HEMOGLOBIN: 12.4 g/dL (ref 12.0–15.0)
Hemoglobin: 12.3 g/dL (ref 12.0–15.0)
MCH: 28.2 pg (ref 26.0–34.0)
MCH: 28 pg (ref 26.0–34.0)
MCHC: 31.7 g/dL (ref 30.0–36.0)
MCHC: 31.7 g/dL (ref 30.0–36.0)
MCV: 88.9 fL (ref 78.0–100.0)
MCV: 88.4 fL (ref 78.0–100.0)
Platelets: 210 10*3/uL (ref 150–400)
Platelets: 330 10*3/uL (ref 150–400)
RBC: 4.4 MIL/uL (ref 3.87–5.11)
RBC: 4.39 MIL/uL (ref 3.87–5.11)
RDW: 19.8 % — AB (ref 11.5–15.5)
RDW: 20 % — AB (ref 11.5–15.5)
WBC: 26.5 10*3/uL — AB (ref 4.0–10.5)
WBC: 27 10*3/uL — AB (ref 4.0–10.5)

## 2017-02-11 LAB — CREATININE, SERUM
CREATININE: 1.01 mg/dL — AB (ref 0.44–1.00)
GFR calc Af Amer: >60 mL/min (ref >60)

## 2017-02-11 LAB — BRAIN NATRIURETIC PEPTIDE: B Natriuretic Peptide: 28.9 pg/mL (ref 0.0–100.0)

## 2017-02-11 MED ORDER — MONTELUKAST SODIUM 10 MG PO TABS
10.0000 mg | ORAL_TABLET | Freq: Every day | ORAL | Status: DC
  Administered 2017-02-11 – 2017-02-13 (×3): 10 mg via ORAL
  Filled 2017-02-11 (×4): qty 1

## 2017-02-11 MED ORDER — BUDESONIDE 0.25 MG/2ML IN SUSP
0.2500 mg | Freq: Two times a day (BID) | RESPIRATORY_TRACT | Status: DC
  Administered 2017-02-11 – 2017-02-13 (×6): 0.25 mg via RESPIRATORY_TRACT
  Filled 2017-02-11 (×7): qty 2

## 2017-02-11 MED ORDER — ONDANSETRON HCL 4 MG PO TABS: 4.0000 mg | ORAL_TABLET | Freq: Four times a day (QID) | ORAL | Status: DC | PRN

## 2017-02-11 MED ORDER — LORATADINE 10 MG PO TABS
10.0000 mg | ORAL_TABLET | Freq: Every day | ORAL | Status: DC
  Administered 2017-02-11 – 2017-02-13 (×3): 10 mg via ORAL
  Filled 2017-02-11 (×3): qty 1

## 2017-02-11 MED ORDER — ORAL CARE MOUTH RINSE
15.0000 mL | Freq: Two times a day (BID) | OROMUCOSAL | Status: DC
  Administered 2017-02-12 – 2017-02-13 (×2): 15 mL via OROMUCOSAL

## 2017-02-11 MED ORDER — HYDRALAZINE HCL 20 MG/ML IJ SOLN
10.0000 mg | INTRAMUSCULAR | Status: DC | PRN
Start: 2017-02-11 — End: 2017-02-14
  Administered 2017-02-11 – 2017-02-12 (×2): 10 mg via INTRAVENOUS
  Filled 2017-02-11 (×2): qty 1

## 2017-02-11 MED ORDER — IPRATROPIUM-ALBUTEROL 0.5-2.5 (3) MG/3ML IN SOLN
3.0000 mL | RESPIRATORY_TRACT | Status: DC
  Administered 2017-02-11 – 2017-02-13 (×19): 3 mL via RESPIRATORY_TRACT
  Filled 2017-02-11 (×18): qty 3

## 2017-02-11 MED ORDER — METHYLPREDNISOLONE SODIUM SUCC 125 MG IJ SOLR
40.0000 mg | Freq: Two times a day (BID) | INTRAMUSCULAR | Status: DC
  Administered 2017-02-11: 40 mg via INTRAVENOUS
  Filled 2017-02-11: qty 2

## 2017-02-11 MED ORDER — PREDNISONE 20 MG PO TABS
40.0000 mg | ORAL_TABLET | Freq: Every day | ORAL | Status: DC
  Administered 2017-02-12: 40 mg via ORAL
  Filled 2017-02-11: qty 2

## 2017-02-11 MED ORDER — PANTOPRAZOLE SODIUM 40 MG PO TBEC
40.0000 mg | DELAYED_RELEASE_TABLET | Freq: Every day | ORAL | Status: DC
  Administered 2017-02-11 – 2017-02-13 (×3): 40 mg via ORAL
  Filled 2017-02-11 (×3): qty 1

## 2017-02-11 MED ORDER — ONDANSETRON HCL 4 MG/2ML IJ SOLN: 4.0000 mg | Freq: Four times a day (QID) | INTRAMUSCULAR | Status: DC | PRN

## 2017-02-11 MED ORDER — ZOLPIDEM TARTRATE 5 MG PO TABS
5.0000 mg | ORAL_TABLET | Freq: Every evening | ORAL | Status: DC | PRN
  Administered 2017-02-11 (×2): 5 mg via ORAL
  Filled 2017-02-11 (×3): qty 1

## 2017-02-11 MED ORDER — METOPROLOL TARTRATE 100 MG PO TABS
100.0000 mg | ORAL_TABLET | Freq: Two times a day (BID) | ORAL | Status: DC
Start: 2017-02-11 — End: 2017-02-14
  Administered 2017-02-11 – 2017-02-13 (×6): 100 mg via ORAL
  Filled 2017-02-11 (×6): qty 1

## 2017-02-11 MED ORDER — ACETAMINOPHEN 650 MG RE SUPP: 650.0000 mg | Freq: Four times a day (QID) | RECTAL | Status: DC | PRN

## 2017-02-11 MED ORDER — FLUTICASONE PROPIONATE 50 MCG/ACT NA SUSP
2.0000 | Freq: Every day | NASAL | Status: DC
  Administered 2017-02-11 – 2017-02-13 (×3): 2 via NASAL
  Filled 2017-02-11: qty 16

## 2017-02-11 MED ORDER — CHLORHEXIDINE GLUCONATE 0.12 % MT SOLN
15.0000 mL | Freq: Two times a day (BID) | OROMUCOSAL | Status: DC
  Administered 2017-02-12 – 2017-02-13 (×4): 15 mL via OROMUCOSAL
  Filled 2017-02-11 (×4): qty 15

## 2017-02-11 MED ORDER — FUROSEMIDE 40 MG PO TABS
40.0000 mg | ORAL_TABLET | Freq: Every day | ORAL | Status: DC
  Administered 2017-02-11 – 2017-02-13 (×3): 40 mg via ORAL
  Filled 2017-02-11 (×3): qty 1

## 2017-02-11 MED ORDER — ENOXAPARIN SODIUM 40 MG/0.4ML ~~LOC~~ SOLN
40.0000 mg | Freq: Every day | SUBCUTANEOUS | Status: DC
  Administered 2017-02-11 – 2017-02-12 (×2): 40 mg via SUBCUTANEOUS
  Filled 2017-02-11 (×4): qty 0.4

## 2017-02-11 MED ORDER — BUSPIRONE HCL 5 MG PO TABS
7.5000 mg | ORAL_TABLET | Freq: Three times a day (TID) | ORAL | Status: DC
  Administered 2017-02-11 – 2017-02-13 (×10): 7.5 mg via ORAL
  Filled 2017-02-11: qty 2
  Filled 2017-02-11: qty 1
  Filled 2017-02-11 (×2): qty 2
  Filled 2017-02-11: qty 1
  Filled 2017-02-11 (×6): qty 2

## 2017-02-11 MED ORDER — SPIRONOLACTONE 25 MG PO TABS
100.0000 mg | ORAL_TABLET | Freq: Every day | ORAL | Status: DC
  Administered 2017-02-11 – 2017-02-13 (×3): 100 mg via ORAL
  Filled 2017-02-11: qty 4
  Filled 2017-02-11: qty 1
  Filled 2017-02-11 (×2): qty 4

## 2017-02-11 MED ORDER — ACETAMINOPHEN 325 MG PO TABS
650.0000 mg | ORAL_TABLET | Freq: Four times a day (QID) | ORAL | Status: DC | PRN
  Administered 2017-02-11 – 2017-02-12 (×3): 650 mg via ORAL
  Filled 2017-02-11 (×3): qty 2

## 2017-02-11 MED ORDER — ALBUTEROL SULFATE (2.5 MG/3ML) 0.083% IN NEBU: 2.5000 mg | INHALATION_SOLUTION | RESPIRATORY_TRACT | Status: DC | PRN

## 2017-02-11 MED ORDER — BUTALBITAL-APAP-CAFFEINE 50-325-40 MG PO TABS
1.0000 | ORAL_TABLET | Freq: Four times a day (QID) | ORAL | Status: DC | PRN
  Administered 2017-02-12 – 2017-02-13 (×2): 2 via ORAL
  Administered 2017-02-13: 1 via ORAL
  Filled 2017-02-11: qty 2
  Filled 2017-02-11: qty 1
  Filled 2017-02-11: qty 2

## 2017-02-11 MED ORDER — SERTRALINE HCL 100 MG PO TABS
100.0000 mg | ORAL_TABLET | Freq: Every day | ORAL | Status: DC
  Administered 2017-02-11 – 2017-02-13 (×3): 100 mg via ORAL
  Filled 2017-02-11 (×3): qty 1

## 2017-02-11 MED ORDER — NICOTINE 14 MG/24HR TD PT24
14.0000 mg | MEDICATED_PATCH | Freq: Every day | TRANSDERMAL | Status: DC
  Administered 2017-02-11 – 2017-02-13 (×3): 14 mg via TRANSDERMAL
  Filled 2017-02-11 (×3): qty 1

## 2017-02-11 MED ORDER — GUAIFENESIN ER 600 MG PO TB12
600.0000 mg | ORAL_TABLET | Freq: Two times a day (BID) | ORAL | Status: DC
  Administered 2017-02-11 – 2017-02-12 (×4): 600 mg via ORAL
  Filled 2017-02-11 (×4): qty 1

## 2017-02-11 MED ORDER — LOSARTAN POTASSIUM 50 MG PO TABS
100.0000 mg | ORAL_TABLET | Freq: Every day | ORAL | Status: DC
  Administered 2017-02-11 – 2017-02-13 (×3): 100 mg via ORAL
  Filled 2017-02-11 (×4): qty 2

## 2017-02-11 MED ORDER — AMLODIPINE BESYLATE 10 MG PO TABS
10.0000 mg | ORAL_TABLET | Freq: Every day | ORAL | Status: DC
  Administered 2017-02-11 – 2017-02-13 (×3): 10 mg via ORAL
  Filled 2017-02-11 (×3): qty 1

## 2017-02-11 NOTE — ED Notes (Signed)
Admitting provider bedside 

## 2017-02-11 NOTE — H&P (Signed)
History and Physical    April [name] ALP:[id number] DOB: [birth year] DOA: 02/10/2017  PCP: Arnoldo Morale, MD  Patient coming from: home.  Chief Complaint: shortness of breath.  HPI: April [name] is a 46 y.o. female with asthma, hypertension, diastolic dysfunction and sleep apnea presents to the ER because of persistent shortness of breath. Patient had been recently admitted and discharged 10 days agoand had come to the ER a few days ago and was evaluated by pulmonologist and was advised to be compliant with the medications and was discharged home presents to the ER because of worsening shortness of breath with wheezing. Patient states that since discharge her wheezing has progressively gotten more worse. Denies any chest pain productive cough fever or chills. Patient has history of noncompliance with medications and has not followed her pulmonologist for [place].  ED Course: In the ER patient continues to be wheezing and was placed on nebulizer treatment and steroids. Patient's blood pressure is uncontrolled. Patient admitted for further management of asthma exacerbation and uncontrolled blood pressure.  Review of Systems: As per HPI, rest all negative.   Past Medical History:  Diagnosis Date  . Acanthosis nigricans   . Arthritis   . Asthma    Followed by Dr. Annamaria Boots (pulmonology); receives every other week omalizumab injections; has frequent exacerbations  . COPD (chronic obstructive pulmonary disease) ([place])    PFTs in 2002, FEV1/FVC 65, no post bronchodilater test done  . Depression   . GERD (gastroesophageal reflux disease)   . Headache(784.0)   . Helicobacter pylori (H. pylori) infection   . Hypertension, essential   . Insomnia   . Menorrhagia   . Morbid obesity ([place])   . Obesity   . Seasonal allergies   . Shortness of breath   . Sleep apnea    Sleep study 2008 - mild OSA, not enough events to titrate CPAP  . Tobacco user     Past Surgical History:  Procedure Laterality  Date  . BREAST REDUCTION SURGERY  09/2011  . TUBAL LIGATION  1996   bilateral     reports that she quit smoking about 2 years ago. Her smoking use included Cigarettes. She has a 9.00 pack-year smoking history. She has never used smokeless tobacco. She reports that she does not drink alcohol or use drugs.  No Known Allergies  Family History  Problem Relation Age of Onset  . Hypertension Mother   . Asthma Daughter   . Cancer Paternal Aunt   . Asthma Maternal Grandmother     Prior to Admission medications   Medication Sig Start Date End Date Taking? Authorizing Provider  albuterol (PROVENTIL HFA;VENTOLIN HFA) 108 (90 Base) MCG/ACT inhaler Inhale 2 puffs into the lungs every 4 (four) hours as needed for wheezing or shortness of breath. 02/04/17  Yes Mu, Pilar Plate, MD  amLODipine (NORVASC) 10 MG tablet Take 1 tablet (10 mg total) by mouth daily. 12/13/16  Yes Rai, Ripudeep K, MD  arformoterol (BROVANA) 15 MCG/2ML NEBU Take 2 mLs (15 mcg total) by nebulization 2 (two) times daily. 02/05/17  Yes Amao, Charlane Ferretti, MD  budesonide (PULMICORT) 0.25 MG/2ML nebulizer solution Take 2 mLs (0.25 mg total) by nebulization 2 (two) times daily. 01/02/17  Yes Patrecia Pour, Christean Grief, MD  busPIRone (BUSPAR) 7.5 MG tablet Take 1 tablet (7.5 mg total) by mouth 3 (three) times daily. 02/05/17  Yes Arnoldo Morale, MD  butalbital-acetaminophen-caffeine (FIORICET, ESGIC) 50-325-40 MG tablet Take 1-2 tablets by mouth every 6 (six) hours as needed for  headache. 12/19/16 12/19/17 Yes Joy, Shawn C, PA-C  fluticasone (FLONASE) 50 MCG/ACT nasal spray Place 2 sprays into both nostrils daily. 12/13/16  Yes Rai, Ripudeep K, MD  furosemide (LASIX) 40 MG tablet Take 1 tablet (40 mg total) by mouth daily. 01/17/17 01/17/18 Yes Short, Noah Delaine, MD  guaiFENesin (MUCINEX) 600 MG 12 hr tablet Take 1 tablet (600 mg total) by mouth 2 (two) times daily. 12/13/16  Yes Rai, Ripudeep K, MD  loratadine (CLARITIN) 10 MG tablet Take 1 tablet (10 mg total) by  mouth daily. 08/30/15  Yes [name], MD  losartan (COZAAR) 100 MG tablet Take 1 tablet (100 mg total) by mouth daily. 12/13/16  Yes Rai, Ripudeep K, MD  metoprolol tartrate (LOPRESSOR) 100 MG tablet Take 1 tablet (100 mg total) by mouth 2 (two) times daily. 02/05/17  Yes Amao, Charlane Ferretti, MD  mometasone-formoterol (DULERA) 200-5 MCG/ACT AERO Inhale 2 puffs into the lungs 2 (two) times daily. 01/06/17  Yes Young, Tarri Fuller D, MD  montelukast (SINGULAIR) 10 MG tablet Take 1 tablet (10 mg total) by mouth at bedtime. 01/02/17  Yes Patrecia Pour, Christean Grief, MD  nicotine (NICODERM CQ - DOSED IN MG/24 HOURS) 14 mg/24hr patch Place 1 patch (14 mg total) onto the skin daily. Patient taking differently: Place 14 mg onto the skin daily as needed (smoking cessation).  10/04/16  Yes Reyne Dumas, MD  omeprazole (PRILOSEC) 20 MG capsule Take 1 capsule (20 mg total) by mouth daily. Patient taking differently: Take 20 mg by mouth 2 (two) times daily.  10/09/16  Yes Young, Tarri Fuller D, MD  predniSONE (DELTASONE) 20 MG tablet Take 2 tablets (40 mg total) by mouth 2 (two) times daily with a meal. 02/01/17  Yes Patrecia Pour, Christean Grief, MD  sertraline (ZOLOFT) 100 MG tablet Take 1 tablet (100 mg total) by mouth daily. 12/27/16  Yes Arnoldo Morale, MD  spironolactone (ALDACTONE) 100 MG tablet Take 1 tablet (100 mg total) by mouth daily. 01/17/17  Yes Short, Noah Delaine, MD  zolpidem (AMBIEN) 5 MG tablet Take 1 tablet (5 mg total) by mouth at bedtime as needed for sleep. 12/22/16  Yes Cristal Ford, DO    Physical Exam: Vitals:   02/10/17 2015 02/10/17 2108 02/10/17 2201 02/10/17 2303  BP: (!) 186/88  (!) 141/74   Pulse: (!) 101 (!) 108 99 93  Resp: (!) 24 (!) 25 18 (!) 21  Temp:      TempSrc:      SpO2: 100% 99% 100% 100%  Weight:      Height:          Constitutional: oderately built and nourished. Vitals:   02/10/17 2015 02/10/17 2108 02/10/17 2201 02/10/17 2303  BP: (!) 186/88  (!) 141/74   Pulse: (!) 101 (!) 108 99 93    Resp: (!) 24 (!) 25 18 (!) 21  Temp:      TempSrc:      SpO2: 100% 99% 100% 100%  Weight:      Height:       Eyes: anicteric without pallor. ENMT: no discharge from the ears eyes nose or month. Neck: no JVD appreciated no mass felt. Respiratory: bilateral expiratory wheeze and no crepitations. Cardiovascular: S1-S2 heard no murmurs appreciated. Abdomen: soft nontender bowel sounds present. No guarding or rigidity. Musculoskeletal: no edema. No joint effusion. Skin: o rash. Skin appears warm. Neurologic:alert awake oriented to time place and person. Moves all extremities. Psychiatric: appears normal. Normal affect.   Labs on Admission: I have personally reviewed following labs  and imaging studies  CBC:  Recent Labs Lab 02/04/17 0549 02/10/17 1955  WBC 21.8* 27.0*  NEUTROABS 20.7* 20.0*  HGB 11.9* 11.8*  HCT 37.6 37.4  MCV 87.0 88.8  PLT 409* 400   Basic Metabolic Panel:  Recent Labs Lab 02/04/17 0658 02/10/17 1955  NA 135 139  K 3.6 3.0*  CL 99* 100*  CO2 22 29  GLUCOSE 273* 144*  BUN 7 7  CREATININE 0.81 0.77  CALCIUM 8.7* 8.5*   GFR: Estimated Creatinine Clearance: 132.7 mL/min (by C-G formula based on SCr of 0.77 mg/dL). Liver Function Tests: No results for input(s): AST, ALT, ALKPHOS, BILITOT, PROT, ALBUMIN in the last 168 hours. No results for input(s): LIPASE, AMYLASE in the last 168 hours. No results for input(s): AMMONIA in the last 168 hours. Coagulation Profile: No results for input(s): INR, PROTIME in the last 168 hours. Cardiac Enzymes: No results for input(s): CKTOTAL, CKMB, CKMBINDEX, TROPONINI in the last 168 hours. BNP (last 3 results) No results for input(s): PROBNP in the last 8760 hours. HbA1C: No results for input(s): HGBA1C in the last 72 hours. CBG: No results for input(s): GLUCAP in the last 168 hours. Lipid Profile: No results for input(s): CHOL, HDL, LDLCALC, TRIG, CHOLHDL, LDLDIRECT in the last 72 hours. Thyroid Function  Tests: No results for input(s): TSH, T4TOTAL, FREET4, T3FREE, THYROIDAB in the last 72 hours. Anemia Panel: No results for input(s): VITAMINB12, FOLATE, FERRITIN, TIBC, IRON, RETICCTPCT in the last 72 hours. Urine analysis:    Component Value Date/Time   COLORURINE YELLOW 01/01/2017 0935   APPEARANCEUR HAZY (A) 01/01/2017 0935   LABSPEC 1.012 01/01/2017 0935   PHURINE 5.0 01/01/2017 0935   GLUCOSEU 50 (A) 01/01/2017 0935   GLUCOSEU NEG mg/dL 10/28/2007 2049   HGBUR NEGATIVE 01/01/2017 0935   BILIRUBINUR NEGATIVE 01/01/2017 0935   KETONESUR NEGATIVE 01/01/2017 0935   PROTEINUR NEGATIVE 01/01/2017 0935   UROBILINOGEN 1.0 11/21/2014 0707   NITRITE NEGATIVE 01/01/2017 0935   LEUKOCYTESUR NEGATIVE 01/01/2017 0935   Sepsis Labs: @LABRCNTIP (procalcitonin:4,lacticidven:4) ) Recent Results (from the past 240 hour(s))  Respiratory Panel by PCR     Status: Abnormal   Collection Time: 02/04/17  8:40 AM  Result Value Ref Range Status   Adenovirus NOT DETECTED NOT DETECTED Final   Coronavirus 229E NOT DETECTED NOT DETECTED Final   Coronavirus HKU1 NOT DETECTED NOT DETECTED Final   Coronavirus NL63 NOT DETECTED NOT DETECTED Final   Coronavirus OC43 NOT DETECTED NOT DETECTED Final   Metapneumovirus NOT DETECTED NOT DETECTED Final   Rhinovirus / Enterovirus DETECTED (A) NOT DETECTED Final   Influenza A NOT DETECTED NOT DETECTED Final   Influenza B NOT DETECTED NOT DETECTED Final   Parainfluenza Virus 1 NOT DETECTED NOT DETECTED Final   Parainfluenza Virus 2 NOT DETECTED NOT DETECTED Final   Parainfluenza Virus 3 NOT DETECTED NOT DETECTED Final   Parainfluenza Virus 4 NOT DETECTED NOT DETECTED Final   Respiratory Syncytial Virus NOT DETECTED NOT DETECTED Final   Bordetella pertussis NOT DETECTED NOT DETECTED Final   Chlamydophila pneumoniae NOT DETECTED NOT DETECTED Final   Mycoplasma pneumoniae NOT DETECTED NOT DETECTED Final     Radiological Exams on Admission: Dg Chest 2  View  Result Date: 02/10/2017 CLINICAL DATA:  Increasing shortness of Breath EXAM: CHEST  2 VIEW COMPARISON:  02/04/2017 FINDINGS: The heart size and mediastinal contours are within normal limits. Both lungs are clear. The visualized skeletal structures are unremarkable. IMPRESSION: No active cardiopulmonary disease. Electronically Signed  By: Inez Catalina M.D.   On: 02/10/2017 16:14     Assessment/Plan Principal Problem:   Acute respiratory failure with hypoxia (HCC) Active Problems:   Morbid obesity with body mass index of 50.0-59.9 in adult [facility])   Asthma exacerbation in COPD ([place])   Hypertension, essential   Sleep apnea   Chronic diastolic heart failure, NYHA class 1 ([place])    1.  Acute respiratory failure and hypoxia from asthma exacerbation - patient has been placed on IV steroids Pulmicort and nebulizer. Patient also has hypertension uncontrolled which could be contributing to her symptoms. In addition to her home antihypertensives including Aldactone, Cozaar, Norvasc and metoprolol I have placed patient on when necessary IV hydralazine. Patient has history of noncompliance with difficult to control asthma and has not been to her pulmonologist to get her Xolair treatment as requested. Patient has been advised to follow-up for her Xolair treatments. 2. Hypertension uncontrolled - see #1. 3. Sleep apnea on CPAP. 4. Morbid obesity. 5. History of diastolic dysfunction last 2-D echo done in June 2018 showing EF of 55-60% with grade 1 diastolic dysfunction.Patient appears compensated.   DVT prophylaxis: Lovenox. Code Status: full code.  Family Communication: iscussed with patient.  Disposition Plan: ome.  Consults called: none.  Admission status: inpatient.    [name] MD [facility] Pager [phone].  If 7PM-7AM, please contact night-coverage [email or web address] Password [email or web address]  02/11/2017, 12:39 AM

## 2017-02-11 NOTE — Progress Notes (Signed)
Inpatient Diabetes Program Recommendations  AACE/ADA: New Consensus Statement on Inpatient Glycemic Control (2015)  Target Ranges:  Prepandial:   less than 140 mg/dL      Peak postprandial:   less than 180 mg/dL (1-2 hours)      Critically ill patients:  140 - 180 mg/dL   Lab Results  Component Value Date   GLUCAP 100 (H) 01/26/2017   HGBA1C 6.1 (H) 02/04/2017    Review of Glycemic ControlResults for April [name], April [name] (MRN [id number]) as of 02/11/2017 11:48  Ref. Range 02/10/2017 19:55 02/11/2017 05:12  Glucose Latest Ref Range: 65 - 99 mg/dL 144 (H) 250 (H)   Diabetes history: None Hospital meds:  Solumedrol 40 mg IV q 12 hours Inpatient Diabetes Program Recommendations:   Please consider adding Novolog correction- moderate while patient is receiving steroids in the hospital.    Thanks, Adah Perl, RN, BC-ADM Inpatient Diabetes Coordinator Pager [phone]

## 2017-02-11 NOTE — Clinical Social Work Note (Signed)
Clinical Social Work Assessment  Patient Details  Name: April [name] MRN: [id number] Date of Birth: Nov 23, 1970  Date of referral:  02/11/17               Reason for consult:  Mental Health Concerns                Permission sought to share information with:    Permission granted to share information::  No  Name::        Agency::     Relationship::     Contact Information:     Housing/Transportation Living arrangements for the past 2 months:  Apartment Source of Information:  Patient Patient Interpreter Needed:  None Criminal Activity/Legal Involvement Pertinent to Current Situation/Hospitalization:  No - Comment as needed Significant Relationships:  Adult Children, Parents Lives with:  Self Do you feel safe going back to the place where you live?  Yes Need for family participation in patient care:  No (Coment)  Care giving concerns: Patient from home alone. CSW consulted by [facility] for mental health resources for patient.   Social Worker assessment / plan: CSW met with patient at bedside and discussed reason for consult. Patient indicated she feels worried and anxious about a lot of things going on in her life. She has started an SSD application and is waiting on determination and she is worried about her grandchildren and would like to have legal custody of them; pt indicated there is an open DSS case and the children's father currently has custody. CSW provided active listening and acknowledged pt's experience. Pt stated she is interested in counseling services. Pt denied suicidal ideation, stating she just feels somewhat hopeless with everything going on. CSW provided informational brochure for [facility] the [place] and discussed their services. Pt appreciative of resource. CSW signing off, please re-consult if any additional needs during patient's admission.  Employment status:  Disabled (Comment on whether or not currently receiving Disability) Insurance information:   Medicaid In [place] PT Recommendations:  Not assessed at this time Information / Referral to community resources:  [place]  Patient/Family's Response to care: Patient appreciative of resources.  Patient/Family's Understanding of and Emotional Response to Diagnosis, Current Treatment, and Prognosis: Patient with understanding of her medical condition and aware of mental health symptoms. Patient agreeable to seeking outpatient counseling services.  Emotional Assessment Appearance:  Appears stated age Attitude/Demeanor/Rapport:  Other (appropriate) Affect (typically observed):  Calm, Pleasant Orientation:  Oriented to Self, Oriented to Place, Oriented to  Time, Oriented to Situation Alcohol / Substance use:  Not Applicable Psych involvement (Current and /or in the community):  No (Comment)  Discharge Needs  Concerns to be addressed:  Mental Health Concerns Readmission within the last 30 days:  No Current discharge risk:  Lives alone Barriers to Discharge:  Continued Medical Work up   Estanislado Emms, LCSW 02/11/2017, 1:56 PM

## 2017-02-11 NOTE — ED Notes (Signed)
Pt requested additional food and ice.  RN informed her that she could be given more ice but she has already had additional food.Pt OK w/ additional ice

## 2017-02-11 NOTE — Progress Notes (Addendum)
PROGRESS NOTE    April [name]  WCB:[id number] DOB: 04/27/1971 DOA: 02/10/2017 PCP: Arnoldo Morale, MD   Specialists:   Dr. Annamaria Boots of pulmonary   Brief Narrative:  46 year old ? Asthma  Spirometry for 218 = moderate severe obstructive disease FEV1 1.5/57% ratio 0.6, FEF 25-75%  Consideration of biologic as an out patient--has been missing Xolair treatments HTN Diastolic heart failure-EF 65% Obstructive sleep apnea Documented noncompliance with medication adherence   Recent admission 9/30-10/6 cough, dyspnea-CT angiogram at that time no pulmonary embolism Rx's asthma exacerbation  Readmitted 10/15 dyspnea likely secondary to severe persistent asthma  Assessment & Plan:   Principal Problem:   Acute respiratory failure with hypoxia (HCC) Active Problems:   Morbid obesity with body mass index of 50.0-59.9 in adult [facility])   Asthma exacerbation in COPD ([place])   Hypertension, essential   Sleep apnea   Chronic diastolic heart failure, NYHA class 1 (HCC)   Acute hypoxic respiratory failure multifactorial Main component asthma, superimposed on OSA, chronic diastolic heart failure  Change IV medrol--->prednisone 10/17  Trials CPAP hs here [machine at work some issues]  Cont inhalers  Overall looks improved compared to ED description  Diastolic heart failure  Seems euvol  Usual wght ~146 kg,not accurate currently admit weight here 156 lb--reweigh now  Keep currently on lasix 40 qd  HTN  Cont norvasc 10, Losartan 100, Metoprolol 100 bid, aldactone 100  Bipolar   Cont Buspar 7.5 ti, Sertraline 100 od, Ambien 5 hs   lovenox Inpatient D/c tele Home soon  Consultants:   none  Procedures:   none  Antimicrobials:   none    Subjective:  Better breathing fair No cp No fever no chill no sputum  Objective: Vitals:   02/11/17 0300 02/11/17 0338 02/11/17 0437 02/11/17 0502  BP: (!) 161/90 (!) 157/77  (!) 171/90  Pulse: 99 100  (!) 103  Resp: (!) 23 (!) 22   (!) 24  Temp:    98.3 F (36.8 C)  TempSrc:    Oral  SpO2: 100% 100% 99% 100%  Weight:    70.8 kg (156 lb 1.6 oz)  Height:    5\' 6"  (1.676 m)   No intake or output data in the 24 hours ending 02/11/17 0811 Filed Weights   02/10/17 1530 02/10/17 1536 02/11/17 0502  Weight: (!) 145.2 kg (320 lb) (!) 145.2 kg (320 lb) 70.8 kg (156 lb 1.6 oz)    Examination:  BMI 50 Alert oriented in nad cta b abd soft nt nd no rebound No le edema Neuro intact  Data Reviewed: I have personally reviewed following labs and imaging studies  CBC:  Recent Labs Lab 02/10/17 1955 02/11/17 0210 02/11/17 0512  WBC 27.0* 26.5* 27.0*  NEUTROABS 20.0*  --   --   HGB 11.8* 12.4 12.3  HCT 37.4 39.1 38.8  MCV 88.8 88.9 88.4  PLT 294 210 616   Basic Metabolic Panel:  Recent Labs Lab 02/10/17 1955 02/11/17 0210 02/11/17 0512  NA 139  --  135  K 3.0*  --  3.6  CL 100*  --  98*  CO2 29  --  22  GLUCOSE 144*  --  250*  BUN 7  --  10  CREATININE 0.77 1.01* 0.84  CALCIUM 8.5*  --  8.5*   GFR: Estimated Creatinine Clearance: 80 mL/min (by C-G formula based on SCr of 0.84 mg/dL). Liver Function Tests: No results for input(s): AST, ALT, ALKPHOS, BILITOT, PROT, ALBUMIN in the last  168 hours. No results for input(s): LIPASE, AMYLASE in the last 168 hours. No results for input(s): AMMONIA in the last 168 hours. Coagulation Profile: No results for input(s): INR, PROTIME in the last 168 hours. Cardiac Enzymes:  Recent Labs Lab 02/11/17 0512  TROPONINI <0.03   BNP (last 3 results) No results for input(s): PROBNP in the last 8760 hours. HbA1C: No results for input(s): HGBA1C in the last 72 hours. CBG: No results for input(s): GLUCAP in the last 168 hours. Lipid Profile: No results for input(s): CHOL, HDL, LDLCALC, TRIG, CHOLHDL, LDLDIRECT in the last 72 hours. Thyroid Function Tests: No results for input(s): TSH, T4TOTAL, FREET4, T3FREE, THYROIDAB in the last 72 hours. Anemia Panel: No  results for input(s): VITAMINB12, FOLATE, FERRITIN, TIBC, IRON, RETICCTPCT in the last 72 hours. Urine analysis:    Component Value Date/Time   COLORURINE YELLOW 01/01/2017 0935   APPEARANCEUR HAZY (A) 01/01/2017 0935   LABSPEC 1.012 01/01/2017 0935   PHURINE 5.0 01/01/2017 0935   GLUCOSEU 50 (A) 01/01/2017 0935   GLUCOSEU NEG mg/dL 10/28/2007 2049   HGBUR NEGATIVE 01/01/2017 0935   BILIRUBINUR NEGATIVE 01/01/2017 0935   KETONESUR NEGATIVE 01/01/2017 0935   PROTEINUR NEGATIVE 01/01/2017 0935   UROBILINOGEN 1.0 11/21/2014 0707   NITRITE NEGATIVE 01/01/2017 0935   LEUKOCYTESUR NEGATIVE 01/01/2017 0935     Radiology Studies: Reviewed images personally in health database    Scheduled Meds: . amLODipine  10 mg Oral Daily  . budesonide  0.25 mg Nebulization BID  . busPIRone  7.5 mg Oral TID  . enoxaparin (LOVENOX) injection  40 mg Subcutaneous Daily  . fluticasone  2 spray Each Nare Daily  . furosemide  40 mg Oral Daily  . guaiFENesin  600 mg Oral BID  . ipratropium-albuterol  3 mL Nebulization Q4H  . loratadine  10 mg Oral Daily  . losartan  100 mg Oral Daily  . methylPREDNISolone (SOLU-MEDROL) injection  40 mg Intravenous Q12H  . metoprolol tartrate  100 mg Oral BID  . montelukast  10 mg Oral QHS  . pantoprazole  40 mg Oral Daily  . sertraline  100 mg Oral Daily  . spironolactone  100 mg Oral Daily   Continuous Infusions:   LOS: 1 day    Time spent: [place], MD [facility] ([facility]   If 7PM-7AM, please contact night-coverage [email or web address] Password [email or web address] 02/11/2017, 8:11 AM

## 2017-02-11 NOTE — ED Notes (Signed)
Pt ambulated to the bathroom with standby assist 

## 2017-02-11 NOTE — Care Management Note (Signed)
Case Management Note  Patient Details  Name: April [name] MRN: [id number] Date of Birth: 28-Nov-1970  Subjective/Objective:       Pt presented for hypoxia and acute respiratory failure.  Pt with frequent admissions and complex social needs.  Pt has Medicaid and PCP is Dr. Jarold Song at [facility].  Pt was to have CPAP delivered P[facility] by [facility].  [facility] unable to contact patient.            Action/Plan: [facility] contacted regarding CPAP.  CPAP will be delivered upon d/c to pt's home.  Pt followed by [facility].for community resources.  CSW consulted for possible behavioral health resources.   Expected Discharge Date:                  Expected Discharge Plan:  Home/Self Care  In-House Referral:  Clinical Social Work  Discharge planning Services  CM Consult  Post Acute Care Choice:  NA Choice offered to:  NA  DME Arranged:  Continuous positive airway pressure (CPAP) DME Agency:  [place]:  NA [place] Agency:  NA  Status of Service:  Completed, signed off  If discussed at [place] of Stay Meetings, dates discussed:    Additional Comments:  Arley Phenix, RN 02/11/2017, 11:05 AM

## 2017-02-12 ENCOUNTER — Inpatient Hospital Stay: Payer: Medicaid Other | Admitting: Adult Health

## 2017-02-12 ENCOUNTER — Telehealth: Payer: Self-pay

## 2017-02-12 MED ORDER — ZOLPIDEM TARTRATE 5 MG PO TABS
10.0000 mg | ORAL_TABLET | Freq: Every evening | ORAL | Status: DC | PRN
  Administered 2017-02-12 – 2017-02-13 (×2): 10 mg via ORAL
  Filled 2017-02-12 (×2): qty 2

## 2017-02-12 MED ORDER — METHYLPREDNISOLONE SODIUM SUCC 125 MG IJ SOLR
60.0000 mg | Freq: Every day | INTRAMUSCULAR | Status: DC
  Administered 2017-02-12 – 2017-02-13 (×2): 60 mg via INTRAVENOUS
  Filled 2017-02-12 (×2): qty 2

## 2017-02-12 NOTE — Progress Notes (Signed)
PROGRESS NOTE    April [name]  VZD:[id number] DOB: 1970/05/11 DOA: 02/10/2017 PCP: Arnoldo Morale, MD    Brief Narrative:   46 y.o. female with asthma, hypertension, diastolic dysfunction and sleep apnea presents to the ER because of persistent shortness of breath. Patient had been recently admitted and discharged 10 days agoand had come to the ER a few days ago and was evaluated by pulmonologist and was advised to be compliant with the medications and was discharged home presents to the ER because of worsening shortness of breath with wheezing.   Reportedly patient has history of noncompliance with medications and not followed with her pulmonologist for [place]. Pt may be having difficulty affording medications.   Assessment & Plan:   Principal Problem:   Acute respiratory failure with hypoxia ([place]) - Secondary to asthma exacerbation in copd. Still wheezes with expiration BL and chest sounds tight. Will change prednisone to solumedrol - continue supplemental oxygen  Active Problems:   Morbid obesity with body mass index of 50.0-59.9 in adult [facility])   Asthma exacerbation in COPD ([place]) - place on Solu-Medrol as listed above - Continue Pulmicort, duonebs  Insomnia - home medication lists ambien at 5 mg pt reports taking higher dose. Will have pharmacy confirm this prior to increasing dose.(ask next am unless pt insists tonight)    Hypertension, essential - continue current regimen    Sleep apnea   Chronic diastolic heart failure, NYHA class 1 (HCC) - appears compensated currently, continue beta blocker and ARB   DVT prophylaxis: Lovenox Code Status: Full Family Communication: None at bedside. Disposition Plan: Pending improvement in condition   Consultants:   none   Procedures: None   Antimicrobials: None   Subjective: Pt has no new complaints, no acute issues overnight.  Objective: Vitals:   02/12/17 1006 02/12/17 1228 02/12/17 1323 02/12/17 1602  BP: (!)  144/74  (!) 144/94   Pulse:    68  Resp:    20  Temp:   98.3 F (36.8 C)   TempSrc:   Oral   SpO2:  99%  96%  Weight:      Height:        Intake/Output Summary (Last 24 hours) at 02/12/17 1833 Last data filed at 02/12/17 0830  Gross per 24 hour  Intake              360 ml  Output                0 ml  Net              360 ml   Filed Weights   02/11/17 0502 02/11/17 1736 02/12/17 0358  Weight: 70.8 kg (156 lb 1.6 oz) (!) 157.8 kg (347 lb 14.4 oz) (!) 157.3 kg (346 lb 12.8 oz)    Examination:  General exam: Appears calm and comfortable, in nad. Respiratory system: equal chest rise, expiratory wheezes BL, no rhales Cardiovascular system: S1 & S2 heard, RRR. No JVD, murmurs, rubs Gastrointestinal system: Abdomen is nondistended, soft and nontender. No organomegaly or masses felt. Normal bowel sounds heard. Central nervous system: Alert and oriented. No focal neurological deficits. Extremities: Symmetric 5 x 5 power. Skin: No rashes, lesions or ulcers, on limited exam. Psychiatry:  Mood & affect appropriate.     Data Reviewed: I have personally reviewed following labs and imaging studies  CBC:  Recent Labs Lab 02/10/17 1955 02/11/17 0210 02/11/17 0512  WBC 27.0* 26.5* 27.0*  NEUTROABS 20.0*  --   --  HGB 11.8* 12.4 12.3  HCT 37.4 39.1 38.8  MCV 88.8 88.9 88.4  PLT 294 210 914   Basic Metabolic Panel:  Recent Labs Lab 02/10/17 1955 02/11/17 0210 02/11/17 0512  NA 139  --  135  K 3.0*  --  3.6  CL 100*  --  98*  CO2 29  --  22  GLUCOSE 144*  --  250*  BUN 7  --  10  CREATININE 0.77 1.01* 0.84  CALCIUM 8.5*  --  8.5*   GFR: Estimated Creatinine Clearance: 132.9 mL/min (by C-G formula based on SCr of 0.84 mg/dL). Liver Function Tests: No results for input(s): AST, ALT, ALKPHOS, BILITOT, PROT, ALBUMIN in the last 168 hours. No results for input(s): LIPASE, AMYLASE in the last 168 hours. No results for input(s): AMMONIA in the last 168 hours. Coagulation  Profile: No results for input(s): INR, PROTIME in the last 168 hours. Cardiac Enzymes:  Recent Labs Lab 02/11/17 0512  TROPONINI <0.03   BNP (last 3 results) No results for input(s): PROBNP in the last 8760 hours. HbA1C: No results for input(s): HGBA1C in the last 72 hours. CBG: No results for input(s): GLUCAP in the last 168 hours. Lipid Profile: No results for input(s): CHOL, HDL, LDLCALC, TRIG, CHOLHDL, LDLDIRECT in the last 72 hours. Thyroid Function Tests: No results for input(s): TSH, T4TOTAL, FREET4, T3FREE, THYROIDAB in the last 72 hours. Anemia Panel: No results for input(s): VITAMINB12, FOLATE, FERRITIN, TIBC, IRON, RETICCTPCT in the last 72 hours. Sepsis Labs: No results for input(s): PROCALCITON, LATICACIDVEN in the last 168 hours.  Recent Results (from the past 240 hour(s))  Respiratory Panel by PCR     Status: Abnormal   Collection Time: 02/04/17  8:40 AM  Result Value Ref Range Status   Adenovirus NOT DETECTED NOT DETECTED Final   Coronavirus 229E NOT DETECTED NOT DETECTED Final   Coronavirus HKU1 NOT DETECTED NOT DETECTED Final   Coronavirus NL63 NOT DETECTED NOT DETECTED Final   Coronavirus OC43 NOT DETECTED NOT DETECTED Final   Metapneumovirus NOT DETECTED NOT DETECTED Final   Rhinovirus / Enterovirus DETECTED (A) NOT DETECTED Final   Influenza A NOT DETECTED NOT DETECTED Final   Influenza B NOT DETECTED NOT DETECTED Final   Parainfluenza Virus 1 NOT DETECTED NOT DETECTED Final   Parainfluenza Virus 2 NOT DETECTED NOT DETECTED Final   Parainfluenza Virus 3 NOT DETECTED NOT DETECTED Final   Parainfluenza Virus 4 NOT DETECTED NOT DETECTED Final   Respiratory Syncytial Virus NOT DETECTED NOT DETECTED Final   Bordetella pertussis NOT DETECTED NOT DETECTED Final   Chlamydophila pneumoniae NOT DETECTED NOT DETECTED Final   Mycoplasma pneumoniae NOT DETECTED NOT DETECTED Final         Radiology Studies: No results found.      Scheduled Meds: .  amLODipine  10 mg Oral Daily  . budesonide  0.25 mg Nebulization BID  . busPIRone  7.5 mg Oral TID  . chlorhexidine  15 mL Mouth Rinse BID  . enoxaparin (LOVENOX) injection  40 mg Subcutaneous Daily  . fluticasone  2 spray Each Nare Daily  . furosemide  40 mg Oral Daily  . guaiFENesin  600 mg Oral BID  . ipratropium-albuterol  3 mL Nebulization Q4H  . loratadine  10 mg Oral Daily  . losartan  100 mg Oral Daily  . mouth rinse  15 mL Mouth Rinse q12n4p  . methylPREDNISolone (SOLU-MEDROL) injection  60 mg Intravenous Daily  . metoprolol tartrate  100 mg  Oral BID  . montelukast  10 mg Oral QHS  . nicotine  14 mg Transdermal Daily  . pantoprazole  40 mg Oral Daily  . sertraline  100 mg Oral Daily  . spironolactone  100 mg Oral Daily   Continuous Infusions:   LOS: 2 days    Time spent: > 35 minutes  Velvet Bathe, MD [facility] Pager [phone]  If 7PM-7AM, please contact night-coverage [email or web address] Password [facility] 02/12/2017, 6:33 PM

## 2017-02-12 NOTE — Progress Notes (Signed)
[employer]. 10 mg prescription for Lorrin Mais was confirmed. Page sent to MD.

## 2017-02-12 NOTE — Telephone Encounter (Signed)
Message received from Chriss Driver - Cherlyn Cushing, RN CM noting that the patient is having difficulty paying her medication co-pays. Return message left noting that the patient can charge those co-pays to her account and can pay off as she is able, even if she can pay $1.00

## 2017-02-13 DIAGNOSIS — J020 Streptococcal pharyngitis: Secondary | ICD-10-CM

## 2017-02-13 DIAGNOSIS — J4551 Severe persistent asthma with (acute) exacerbation: Secondary | ICD-10-CM

## 2017-02-13 DIAGNOSIS — I10 Essential (primary) hypertension: Secondary | ICD-10-CM

## 2017-02-13 DIAGNOSIS — Z6841 Body Mass Index (BMI) 40.0 and over, adult: Secondary | ICD-10-CM

## 2017-02-13 DIAGNOSIS — I5032 Chronic diastolic (congestive) heart failure: Secondary | ICD-10-CM

## 2017-02-13 LAB — RAPID STREP SCREEN (MED CTR MEBANE ONLY): STREPTOCOCCUS, GROUP A SCREEN (DIRECT): POSITIVE — AB

## 2017-02-13 LAB — RAPID URINE DRUG SCREEN, HOSP PERFORMED
AMPHETAMINES: NOT DETECTED
BENZODIAZEPINES: NOT DETECTED
Barbiturates: POSITIVE — AB
COCAINE: NOT DETECTED
OPIATES: NOT DETECTED
Tetrahydrocannabinol: NOT DETECTED

## 2017-02-13 LAB — PREGNANCY, URINE: PREG TEST UR: NEGATIVE

## 2017-02-13 MED ORDER — METOPROLOL TARTRATE 5 MG/5ML IV SOLN
10.0000 mg | Freq: Once | INTRAVENOUS | Status: AC
  Administered 2017-02-13: 10 mg via INTRAVENOUS
  Filled 2017-02-13: qty 10

## 2017-02-13 MED ORDER — GUAIFENESIN-DM 100-10 MG/5ML PO SYRP
5.0000 mL | ORAL_SOLUTION | ORAL | Status: DC | PRN
  Administered 2017-02-13 (×2): 5 mL via ORAL
  Filled 2017-02-13 (×2): qty 5

## 2017-02-13 MED ORDER — AMOXICILLIN-POT CLAVULANATE 875-125 MG PO TABS
1.0000 | ORAL_TABLET | Freq: Two times a day (BID) | ORAL | Status: DC
  Administered 2017-02-13 (×2): 1 via ORAL
  Filled 2017-02-13 (×2): qty 1

## 2017-02-13 NOTE — Progress Notes (Signed)
Patient again walked to nurses station without non skid socks on requesting snacks.  Patient holding on to railing and swaying back and forth, very unsteady on her feet.  This RN and another RN assisted patient back to her room.  Patient stumbled multiple times on the way back.  Patient educated she needed to stay in bed because she was unsteady on her feet and had taken sleeping medication and it could cause adverse affects to stay awake after having sleeping medication.  Patient stated understanding.  Patient wanted to sit on side of bed and eat snacks.  Table pushed up to patient and bed alarm on.

## 2017-02-13 NOTE — Care Management (Signed)
Case Management Note Initial Note Started By Claudie Leach, RN Case Manager.  Patient Details  Name: April [name] MRN: [id number] Date of Birth: Dec 06, 1970  Subjective/Objective:       Pt presented for hypoxia and acute respiratory failure.  Pt with frequent admissions and complex social needs.  Pt has Medicaid and PCP is Dr. Jarold Song at [facility].  Pt was to have CPAP delivered PTA by [facility].  [facility] unable to contact patient.            Action/Plan: [facility] contacted regarding CPAP.  CPAP will be delivered upon d/c to pt's home.  Pt followed by [facility].for community resources.  CSW consulted for possible behavioral health resources.   Expected Discharge Date:                         Expected Discharge Plan:  Home/Self Care  In-House Referral:  Clinical Social Work  Discharge planning Services  CM Consult  Post Acute Care Choice:  NA Choice offered to:  NA  DME Arranged:  Continuous positive airway pressure (CPAP) DME Agency:  [place]:  NA [place] Agency:  NA  Status of Service:  Completed, signed off  If discussed at [place] of Stay Meetings, dates discussed:    Additional Comments: 1532 02-13-17 Jacqlyn Krauss, RN,BSN, Case Manager [phone] CM did discuss with pt in regards to disposition needs for DME CPAP- Pt will need to go by the [facility] store to pick up CPAP. Pt is aware- will place address on the AVS. No further needs from CM at this time.

## 2017-02-13 NOTE — Progress Notes (Addendum)
Patient returned to the unit apologizing to RN for leaving stating she would not leave again.

## 2017-02-13 NOTE — Progress Notes (Signed)
Patient walked to the main nurses station from her room without non-skid socks on to get snacks.  RN educated patient on importance of wearing non-skid socks when out of bed.  Patient stated understanding.

## 2017-02-13 NOTE — Progress Notes (Signed)
Pt. Had a BP of 179/97. 10 mg PRN hydralazine given at 2339. BP rechecked now and 174/105. MD paged and notified. No other PRNs to give. Hydralazine ordered q4

## 2017-02-13 NOTE — Progress Notes (Signed)
PROGRESS NOTE  April [name] [id number] DOB: [birth year] DOA: 02/10/2017 PCP: Arnoldo Morale, MD   LOS: 3 days   Brief Narrative / Interim history: 46 year old female with history of hypertension, diastolic CHF, OSA, morbid obesity, poorly controlled asthma followed by pulmonology as an outpatient who was admitted on 10/15 with asthma exacerbation.  This is her 11th hospitalization during 2018.  She was hospitalized 9 times in 2017.  Assessment & Plan: Principal Problem:   Acute respiratory failure with hypoxia (HCC) Active Problems:   Morbid obesity with body mass index of 50.0-59.9 in adult [facility])   Asthma exacerbation in COPD ([place])   Hypertension, essential   Sleep apnea   Chronic diastolic heart failure, NYHA class 1 (HCC)   Chronic severe asthma with asthma exacerbation -Complicated by sleep apnea, morbid obesity -she had been on Xolair as an outpatient in the past, currently off, seen by Dr. Annamaria Boots -Respiratory virus panel positive for rhinovirus, negative for flu -Continue supportive treatment -Continue IV steroids, DuoNeb, Pulmicort  Strep throat -Patient tells me that prior to coming to the hospital she has been having a scratchy throat, on exam there are white exudates, rapid strep is positive, start Augmentin plan for 10-day treatment  Morbid obesity -She is being evaluated by bariatric surgery at [facility]  Hypertension -Continue amlodipine, Cozaar, metoprolol, spironolactone Depression -Continue Zoloft, BuSpar  Tobacco abuse, in remission  DVT prophylaxis: Lovenox Code Status: Full code Family Communication: No family at bedside Disposition Plan: Home when ready  Consultants:   None  Procedures:   None   Antimicrobials:  Augmentin 10/18 >>   Subjective: -Continues to have shortness of breath, no chest pain, no abdominal pain, no nausea or vomiting  Objective: Vitals:   02/13/17 1000 02/13/17 1028 02/13/17 1227 02/13/17 1309  BP: (!) 157/96    (!) 157/97  Pulse:  83    Resp:      Temp:    98 F (36.7 C)  TempSrc:    Oral  SpO2:   97%   Weight:      Height:       No intake or output data in the 24 hours ending 02/13/17 1331 Filed Weights   02/11/17 1736 02/12/17 0358 02/13/17 0439  Weight: (!) 157.8 kg (347 lb 14.4 oz) (!) 157.3 kg (346 lb 12.8 oz) (!) 157.2 kg (346 lb 9.6 oz)    Examination:  Constitutional: NAD Eyes:  lids and conjunctivae normal ENMT: Mucous membranes are moist.  Whitish oropharyngeal exudates right side Neck: Tender lymphadenopathy on right Respiratory: Bilateral wheezing, no crackles, increased respiratory effort Cardiovascular: Regular rate and rhythm, no murmurs / rubs / gallops. No LE edema. 2+ pedal pulses.  Abdomen: no tenderness. Bowel sounds positive.  Musculoskeletal: no clubbing / cyanosis.  Skin: no rashes, lesions, ulcers. No induration Neurologic: CN 2-12 grossly intact. Strength 5/5 in all 4.  Psychiatric: Normal judgment and insight. Alert and oriented x 3. Normal mood.    Data Reviewed: I have independently reviewed following labs and imaging studies   CBC:  Recent Labs Lab 02/10/17 1955 02/11/17 0210 02/11/17 0512  WBC 27.0* 26.5* 27.0*  NEUTROABS 20.0*  --   --   HGB 11.8* 12.4 12.3  HCT 37.4 39.1 38.8  MCV 88.8 88.9 88.4  PLT 294 210 756   Basic Metabolic Panel:  Recent Labs Lab 02/10/17 1955 02/11/17 0210 02/11/17 0512  NA 139  --  135  K 3.0*  --  3.6  CL 100*  --  98*  CO2 29  --  22  GLUCOSE 144*  --  250*  BUN 7  --  10  CREATININE 0.77 1.01* 0.84  CALCIUM 8.5*  --  8.5*   GFR: Estimated Creatinine Clearance: 132.9 mL/min (by C-G formula based on SCr of 0.84 mg/dL). Liver Function Tests: No results for input(s): AST, ALT, ALKPHOS, BILITOT, PROT, ALBUMIN in the last 168 hours. No results for input(s): LIPASE, AMYLASE in the last 168 hours. No results for input(s): AMMONIA in the last 168 hours. Coagulation Profile: No results for input(s):  INR, PROTIME in the last 168 hours. Cardiac Enzymes:  Recent Labs Lab 02/11/17 0512  TROPONINI <0.03   BNP (last 3 results) No results for input(s): PROBNP in the last 8760 hours. HbA1C: No results for input(s): HGBA1C in the last 72 hours. CBG: No results for input(s): GLUCAP in the last 168 hours. Lipid Profile: No results for input(s): CHOL, HDL, LDLCALC, TRIG, CHOLHDL, LDLDIRECT in the last 72 hours. Thyroid Function Tests: No results for input(s): TSH, T4TOTAL, FREET4, T3FREE, THYROIDAB in the last 72 hours. Anemia Panel: No results for input(s): VITAMINB12, FOLATE, FERRITIN, TIBC, IRON, RETICCTPCT in the last 72 hours. Urine analysis:    Component Value Date/Time   COLORURINE YELLOW 01/01/2017 0935   APPEARANCEUR HAZY (A) 01/01/2017 0935   LABSPEC 1.012 01/01/2017 0935   PHURINE 5.0 01/01/2017 0935   GLUCOSEU 50 (A) 01/01/2017 0935   GLUCOSEU NEG mg/dL 10/28/2007 2049   HGBUR NEGATIVE 01/01/2017 0935   BILIRUBINUR NEGATIVE 01/01/2017 0935   KETONESUR NEGATIVE 01/01/2017 0935   PROTEINUR NEGATIVE 01/01/2017 0935   UROBILINOGEN 1.0 11/21/2014 0707   NITRITE NEGATIVE 01/01/2017 0935   LEUKOCYTESUR NEGATIVE 01/01/2017 0935   Sepsis Labs: Invalid input(s): PROCALCITONIN, LACTICIDVEN  Recent Results (from the past 240 hour(s))  Respiratory Panel by PCR     Status: Abnormal   Collection Time: 02/04/17  8:40 AM  Result Value Ref Range Status   Adenovirus NOT DETECTED NOT DETECTED Final   Coronavirus 229E NOT DETECTED NOT DETECTED Final   Coronavirus HKU1 NOT DETECTED NOT DETECTED Final   Coronavirus NL63 NOT DETECTED NOT DETECTED Final   Coronavirus OC43 NOT DETECTED NOT DETECTED Final   Metapneumovirus NOT DETECTED NOT DETECTED Final   Rhinovirus / Enterovirus DETECTED (A) NOT DETECTED Final   Influenza A NOT DETECTED NOT DETECTED Final   Influenza B NOT DETECTED NOT DETECTED Final   Parainfluenza Virus 1 NOT DETECTED NOT DETECTED Final   Parainfluenza Virus 2 NOT  DETECTED NOT DETECTED Final   Parainfluenza Virus 3 NOT DETECTED NOT DETECTED Final   Parainfluenza Virus 4 NOT DETECTED NOT DETECTED Final   Respiratory Syncytial Virus NOT DETECTED NOT DETECTED Final   Bordetella pertussis NOT DETECTED NOT DETECTED Final   Chlamydophila pneumoniae NOT DETECTED NOT DETECTED Final   Mycoplasma pneumoniae NOT DETECTED NOT DETECTED Final  Rapid strep screen (not at [facility])     Status: Abnormal   Collection Time: 02/13/17  8:13 AM  Result Value Ref Range Status   Streptococcus, Group A Screen (Direct) POSITIVE (A) NEGATIVE Final    Comment: DELTA CHECK NOTED      Radiology Studies: No results found.   Scheduled Meds: . amLODipine  10 mg Oral Daily  . amoxicillin-clavulanate  1 tablet Oral Q12H  . budesonide  0.25 mg Nebulization BID  . busPIRone  7.5 mg Oral TID  . chlorhexidine  15 mL Mouth Rinse BID  . enoxaparin (LOVENOX) injection  40 mg Subcutaneous  Daily  . fluticasone  2 spray Each Nare Daily  . furosemide  40 mg Oral Daily  . ipratropium-albuterol  3 mL Nebulization Q4H  . loratadine  10 mg Oral Daily  . losartan  100 mg Oral Daily  . mouth rinse  15 mL Mouth Rinse q12n4p  . methylPREDNISolone (SOLU-MEDROL) injection  60 mg Intravenous Daily  . metoprolol tartrate  100 mg Oral BID  . montelukast  10 mg Oral QHS  . nicotine  14 mg Transdermal Daily  . pantoprazole  40 mg Oral Daily  . sertraline  100 mg Oral Daily  . spironolactone  100 mg Oral Daily   Continuous Infusions:  Marzetta Board, MD, PhD [facility] Pager [phone]  If 7PM-7AM, please contact night-coverage [email or web address] Password [email or web address] 02/13/2017, 1:31 PM

## 2017-02-13 NOTE — Progress Notes (Signed)
Pt. Refused bed alarm. Was educated on the rational and reasoning behind placement of the bed alarm.

## 2017-02-13 NOTE — Progress Notes (Signed)
Patient had requested on day shift for a wheelchair so she could go off the unit.  Patient was educated that for her own safety she should not leave the unit.  Now patient's husband requested a wheelchair to take patient off the unit and patient and patient's husband were educated that for patient's own safety she should not leave the unit.  RN educated patient that if it was in her best interest to leave the unit the MD would have discharged her today but the doctor did not do so.  RN explained to patient that if patient left the unit security would be called.  Patient stated go ahead and call them because I am leaving the unit.  Patient has now walked off the unit onto the elevators with her husband.  As patient was leaving the unit RN again educated patient that she should not be leaving unit.  Patient turned to RN and then continued to walk away.  RN text paged [facility] that patient was leaving the unit.

## 2017-02-13 NOTE — Progress Notes (Signed)
RN sitting outside of patient's room. Heard bed alarm sounding. Two RNs immediately responded to sounding bed alarm. Patient sitting on floor beside the bed. No external injury noted. Patient denies hitting head. Patient's husband present at bedside. Patient's husband also confirmed patient did not hit head.Patient returned to bed with bed alarm on and instructions to call for help. Patient agreeable to using purewick. Refused BSC. [facility] paged to update.

## 2017-02-13 NOTE — Progress Notes (Signed)
Bed alarm sounding, RNs immediately responded.  Patient sitting on side of bed attempting to walk.  RN offered toileting patient by use of bed pan & bedside commode, refused at this time.  RN also educated patient that she had a purewick on.  Patient laid back down in bed at this time.  Blankets had fallen on the floor.  RN picked up blankets, opened bathroom door and placed them in bathroom linen bag.  Ashes noted on floor bedside toilet and bathroom smelled like cigarette smoke.  RN asked patient and patient's husband if either of them had smoked in the bathroom.  Both denied.  Patient and patient's husband both educated that it was against hospital policy to smoke in the room/bathroom and could be very dangerous due to oxygen.

## 2017-02-14 ENCOUNTER — Encounter (HOSPITAL_COMMUNITY): Payer: Self-pay | Admitting: Emergency Medicine

## 2017-02-14 ENCOUNTER — Observation Stay (HOSPITAL_COMMUNITY)
Admission: EM | Admit: 2017-02-14 | Discharge: 2017-02-16 | Disposition: A | Discharge status: Home / Self Care | Payer: Medicaid Other | Attending: Family Medicine | Admitting: Family Medicine

## 2017-02-14 ENCOUNTER — Telehealth: Payer: Self-pay

## 2017-02-14 ENCOUNTER — Emergency Department (HOSPITAL_COMMUNITY): Payer: Medicaid Other

## 2017-02-14 ENCOUNTER — Other Ambulatory Visit: Payer: Self-pay

## 2017-02-14 DIAGNOSIS — Z7951 Long term (current) use of inhaled steroids: Secondary | ICD-10-CM | POA: Insufficient documentation

## 2017-02-14 DIAGNOSIS — T380X5A Adverse effect of glucocorticoids and synthetic analogues, initial encounter: Secondary | ICD-10-CM | POA: Insufficient documentation

## 2017-02-14 DIAGNOSIS — J020 Streptococcal pharyngitis: Secondary | ICD-10-CM | POA: Insufficient documentation

## 2017-02-14 DIAGNOSIS — K219 Gastro-esophageal reflux disease without esophagitis: Secondary | ICD-10-CM | POA: Insufficient documentation

## 2017-02-14 DIAGNOSIS — J45901 Unspecified asthma with (acute) exacerbation: Principal | ICD-10-CM | POA: Insufficient documentation

## 2017-02-14 DIAGNOSIS — Z79899 Other long term (current) drug therapy: Secondary | ICD-10-CM | POA: Insufficient documentation

## 2017-02-14 DIAGNOSIS — E876 Hypokalemia: Secondary | ICD-10-CM | POA: Insufficient documentation

## 2017-02-14 DIAGNOSIS — Z87891 Personal history of nicotine dependence: Secondary | ICD-10-CM | POA: Insufficient documentation

## 2017-02-14 DIAGNOSIS — X58XXXA Exposure to other specified factors, initial encounter: Secondary | ICD-10-CM | POA: Insufficient documentation

## 2017-02-14 DIAGNOSIS — J449 Chronic obstructive pulmonary disease, unspecified: Secondary | ICD-10-CM | POA: Insufficient documentation

## 2017-02-14 DIAGNOSIS — R0602 Shortness of breath: Secondary | ICD-10-CM

## 2017-02-14 DIAGNOSIS — I10 Essential (primary) hypertension: Secondary | ICD-10-CM | POA: Diagnosis present

## 2017-02-14 DIAGNOSIS — F329 Major depressive disorder, single episode, unspecified: Secondary | ICD-10-CM | POA: Insufficient documentation

## 2017-02-14 DIAGNOSIS — Z9989 Dependence on other enabling machines and devices: Secondary | ICD-10-CM | POA: Insufficient documentation

## 2017-02-14 DIAGNOSIS — D72829 Elevated white blood cell count, unspecified: Secondary | ICD-10-CM | POA: Insufficient documentation

## 2017-02-14 DIAGNOSIS — G4700 Insomnia, unspecified: Secondary | ICD-10-CM | POA: Insufficient documentation

## 2017-02-14 DIAGNOSIS — Z6841 Body Mass Index (BMI) 40.0 and over, adult: Secondary | ICD-10-CM | POA: Insufficient documentation

## 2017-02-14 DIAGNOSIS — G4733 Obstructive sleep apnea (adult) (pediatric): Secondary | ICD-10-CM | POA: Insufficient documentation

## 2017-02-14 LAB — CBC WITH DIFFERENTIAL/PLATELET
Basophils Absolute: 0.1 10*3/uL (ref 0.0–0.1)
Basophils Relative: 0 %
EOS ABS: 0.1 10*3/uL (ref 0.0–0.7)
EOS PCT: 0 %
HCT: 38 % (ref 36.0–46.0)
Hemoglobin: 12.1 g/dL (ref 12.0–15.0)
LYMPHS ABS: 3.9 10*3/uL (ref 0.7–4.0)
LYMPHS PCT: 17 %
MCH: 28.3 pg (ref 26.0–34.0)
MCHC: 31.8 g/dL (ref 30.0–36.0)
MCV: 88.8 fL (ref 78.0–100.0)
MONO ABS: 1.9 10*3/uL — AB (ref 0.1–1.0)
MONOS PCT: 9 %
Neutro Abs: 16.5 10*3/uL — ABNORMAL HIGH (ref 1.7–7.7)
Neutrophils Relative %: 74 %
PLATELETS: 321 10*3/uL (ref 150–400)
RBC: 4.28 MIL/uL (ref 3.87–5.11)
RDW: 19.9 % — ABNORMAL HIGH (ref 11.5–15.5)
WBC: 22.5 10*3/uL — ABNORMAL HIGH (ref 4.0–10.5)

## 2017-02-14 LAB — BASIC METABOLIC PANEL
Anion gap: 11 (ref 5–15)
BUN: 13 mg/dL (ref 6–20)
CO2: 24 mmol/L (ref 22–32)
CREATININE: 0.81 mg/dL (ref 0.44–1.00)
Calcium: 8.1 mg/dL — ABNORMAL LOW (ref 8.9–10.3)
Chloride: 102 mmol/L (ref 101–111)
GFR calc Af Amer: >60 mL/min (ref >60)
GLUCOSE: 132 mg/dL — AB (ref 65–99)
POTASSIUM: 3.2 mmol/L — AB (ref 3.5–5.1)
SODIUM: 137 mmol/L (ref 135–145)

## 2017-02-14 IMAGING — DX DG CHEST 2V
2 series · 2 of 2 positions shown · non-contrast
Comparison: 12/18/2015

CLINICAL DATA: Shortness of breath an wheezing over the last week.

EXAM:
CHEST  2 VIEW

[w chest pa]
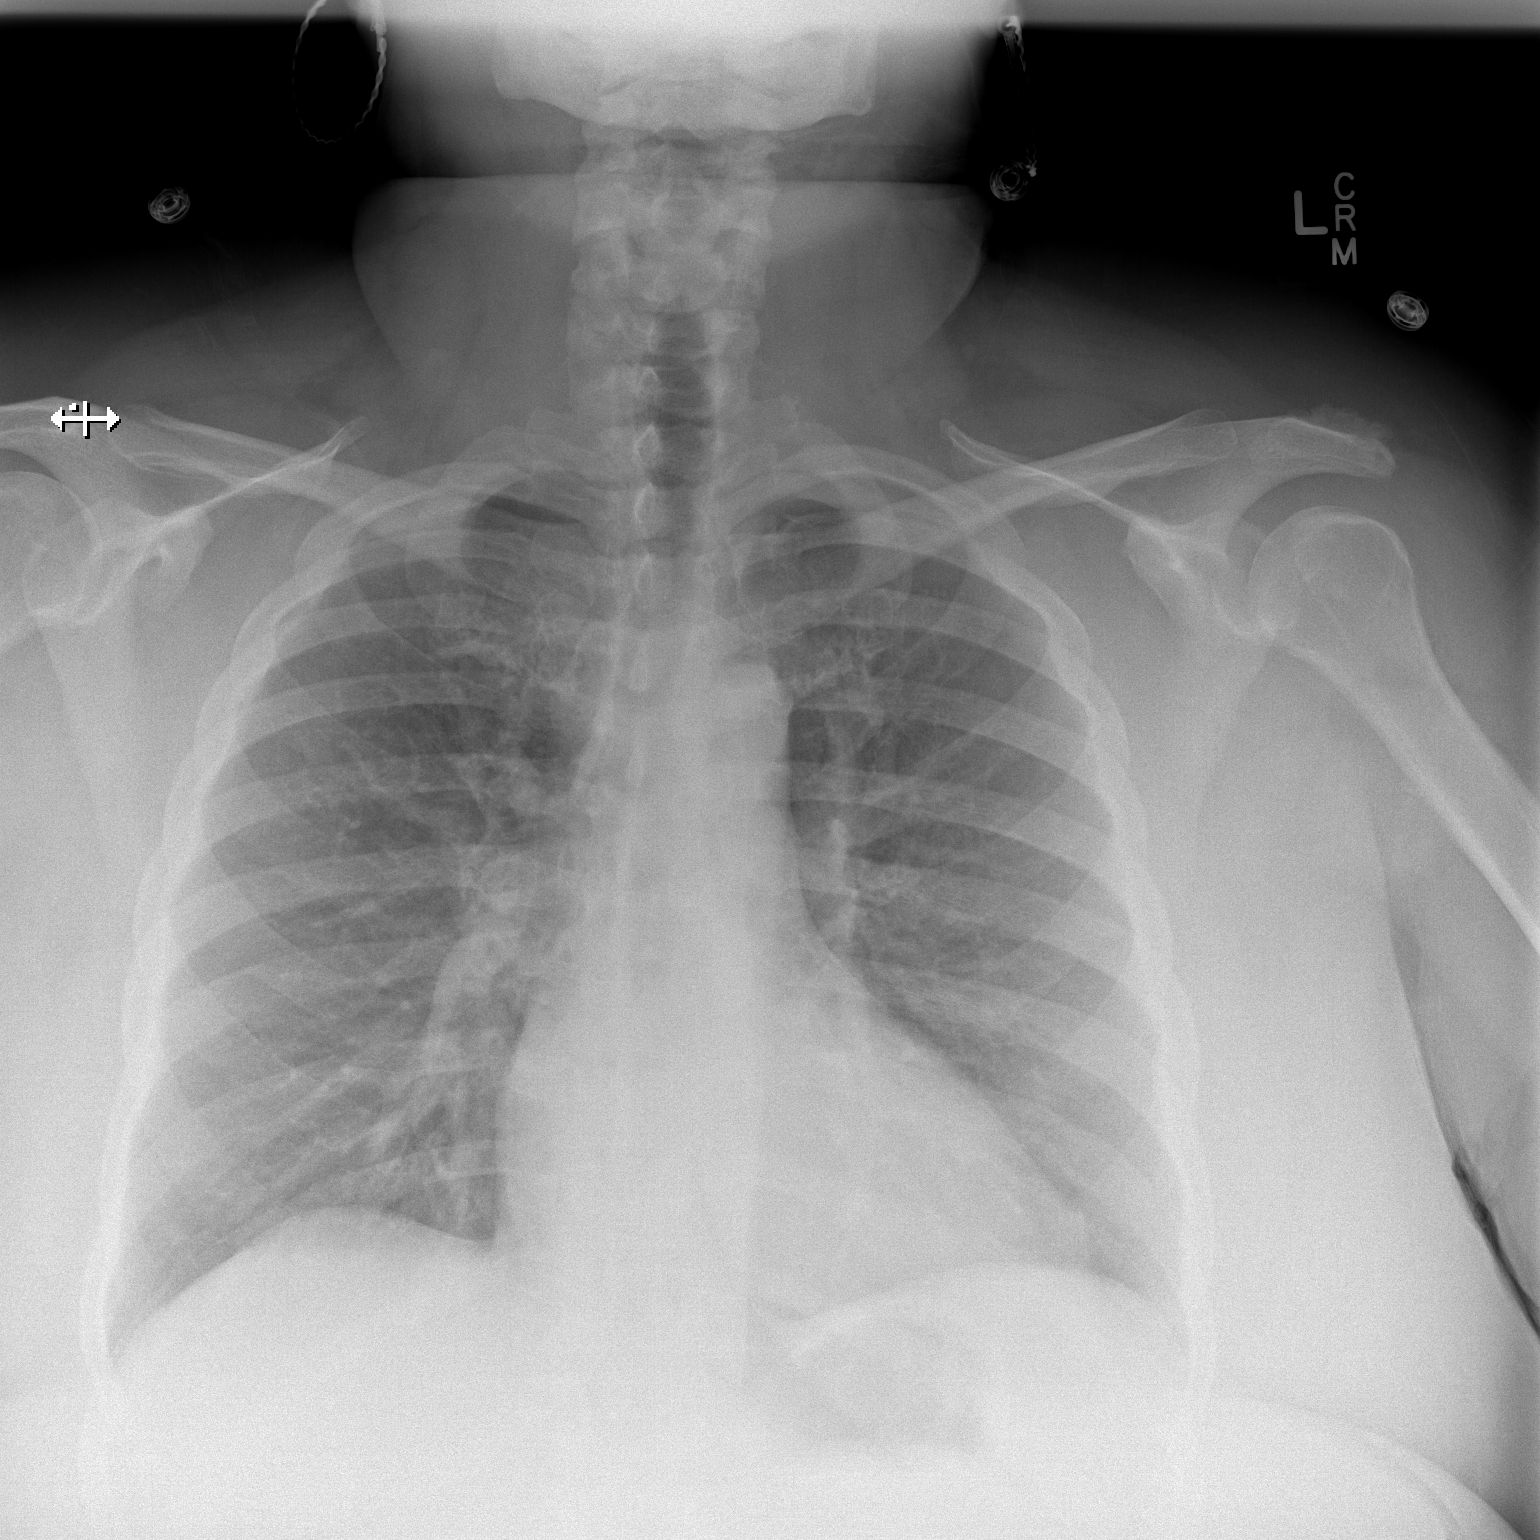

[w chest lat]
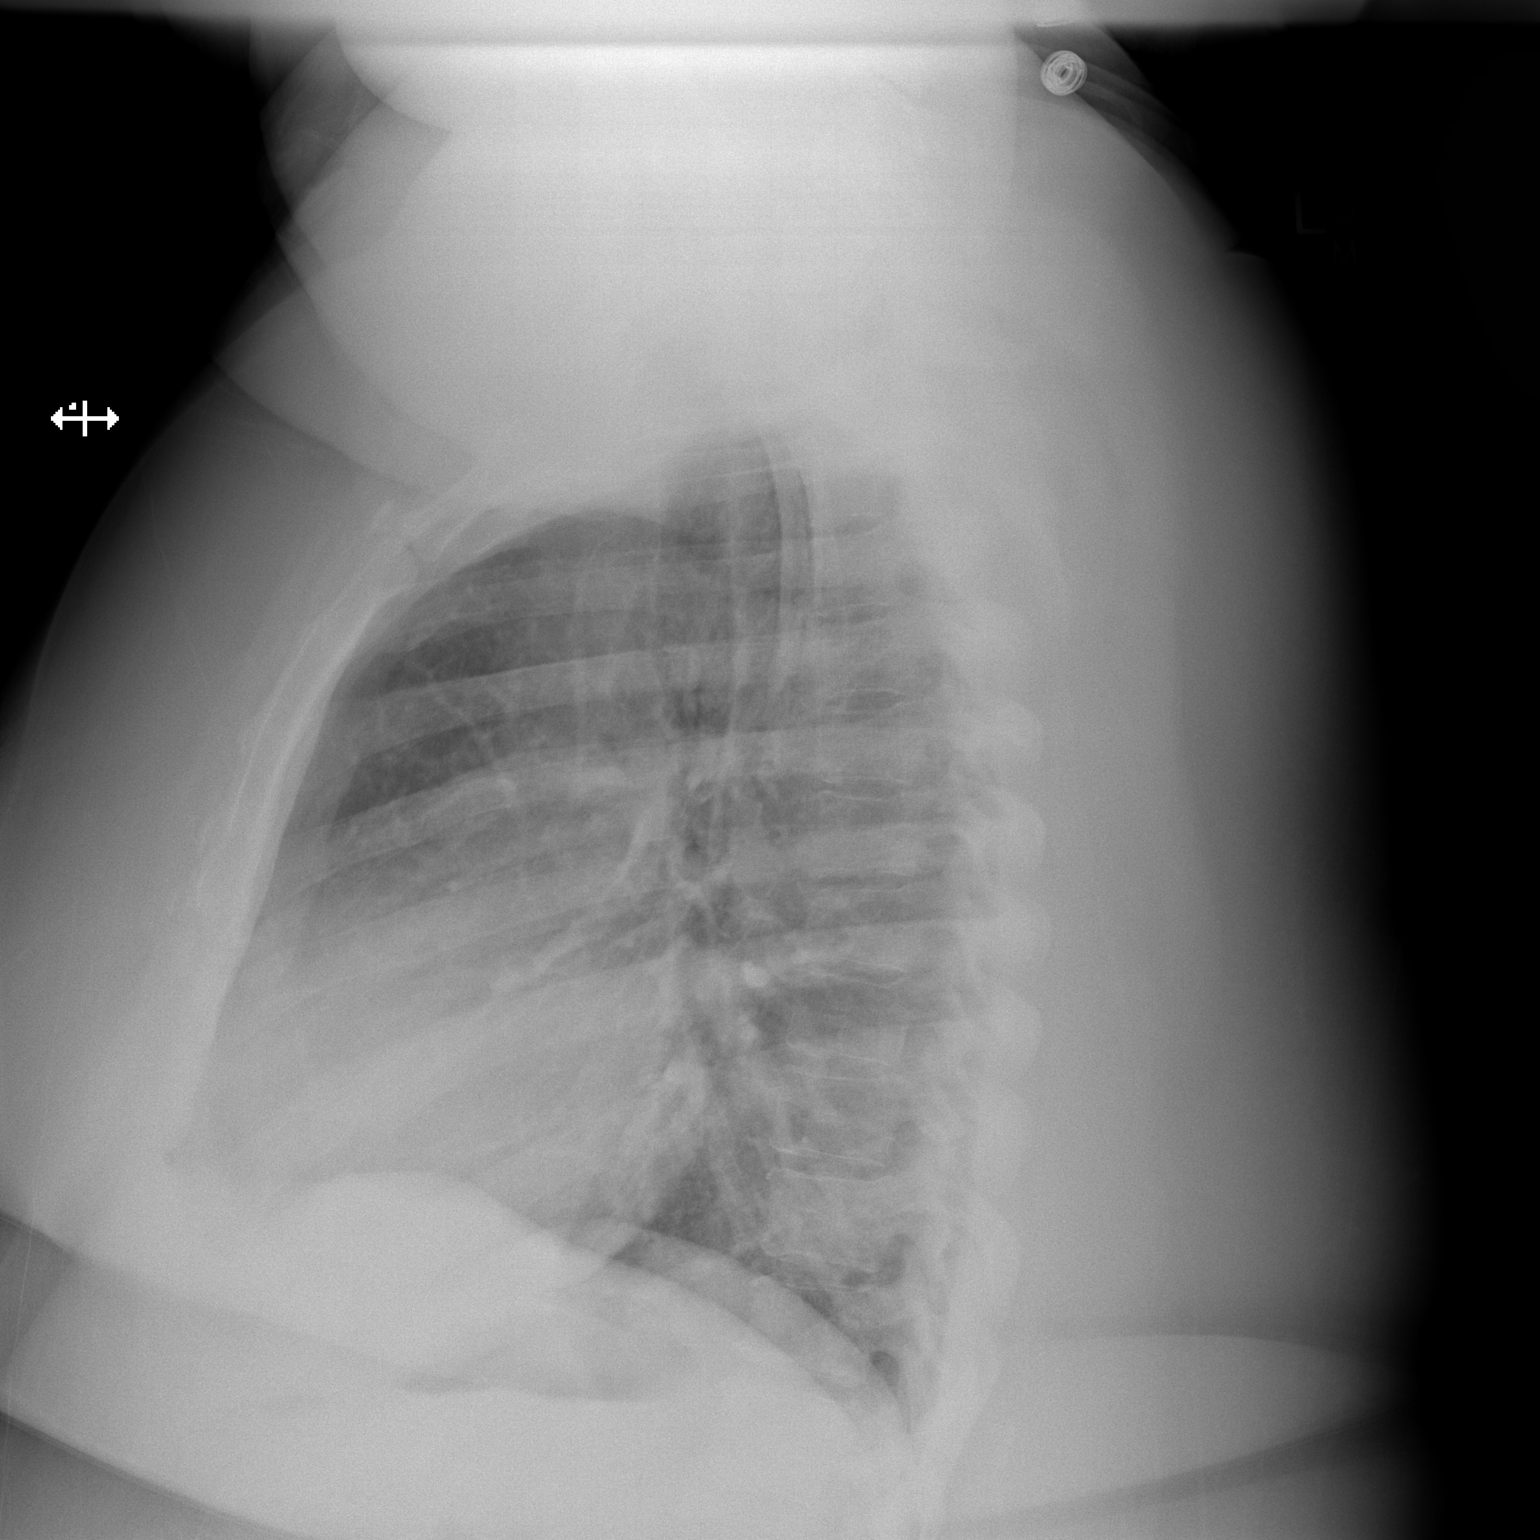

[2 of 2 positions shown; findings below may reference images not displayed]

FINDINGS: Heart size is normal. Mediastinal shadows are normal. There may be
central bronchial thickening but there is no infiltrate, collapse or
effusion. No bony abnormality.
IMPRESSION: Bronchitis.  No consolidation or collapse.

## 2017-02-14 MED ORDER — ALBUTEROL SULFATE (2.5 MG/3ML) 0.083% IN NEBU
5.0000 mg | INHALATION_SOLUTION | Freq: Once | RESPIRATORY_TRACT | Status: AC
  Administered 2017-02-14: 5 mg via RESPIRATORY_TRACT

## 2017-02-14 MED ORDER — ALBUTEROL (5 MG/ML) CONTINUOUS INHALATION SOLN
10.0000 mg/h | INHALATION_SOLUTION | RESPIRATORY_TRACT | Status: DC
  Administered 2017-02-14: 10 mg/h via RESPIRATORY_TRACT

## 2017-02-14 MED ORDER — IPRATROPIUM BROMIDE 0.02 % IN SOLN
0.5000 mg | Freq: Once | RESPIRATORY_TRACT | Status: AC
  Administered 2017-02-14: 0.5 mg via RESPIRATORY_TRACT
  Filled 2017-02-14: qty 2.5

## 2017-02-14 MED ORDER — ALBUTEROL SULFATE (2.5 MG/3ML) 0.083% IN NEBU
INHALATION_SOLUTION | RESPIRATORY_TRACT | Status: AC
  Filled 2017-02-14: qty 6

## 2017-02-14 MED ORDER — MAGNESIUM SULFATE 2 GM/50ML IV SOLN
2.0000 g | Freq: Once | INTRAVENOUS | Status: AC
  Administered 2017-02-15: 2 g via INTRAVENOUS
  Filled 2017-02-14: qty 50

## 2017-02-14 MED ORDER — ALBUTEROL SULFATE (2.5 MG/3ML) 0.083% IN NEBU
2.5000 mg | INHALATION_SOLUTION | RESPIRATORY_TRACT | Status: DC
  Filled 2017-02-14: qty 3

## 2017-02-14 MED ORDER — METHYLPREDNISOLONE SODIUM SUCC 125 MG IJ SOLR
125.0000 mg | Freq: Once | INTRAMUSCULAR | Status: AC
  Administered 2017-02-14: 125 mg via INTRAVENOUS
  Filled 2017-02-14: qty 2

## 2017-02-14 NOTE — Progress Notes (Signed)
Patient bed alarm sounding, RN into room, patient getting out of bed.  RN asked patient why she was getting out of bed.  Patient up in room looking for cigarettes.  RN educated patient that she could not smoke in the hospital.  Patient got cigarettes and lighter and walked to entry way of room.  RN explained to patient if she left the unit it would be against medication advice and she would forfeit her current room and would have to go through the emergency department if needed.  Patient then returned to room and stated she was leaving.  Patient proceeded to put on her pants and remained sitting on the side of bed while patient and significant other had a discussion about patient staying or leaving.  RN then asked patient what patient had decided.  RN had against medical advice form in hand and patient stated "I'm not signing shit, I am waiting to talk to the doctor".  RN explained to patient her regular doctor would probably not round until 8-10am.  Patient did not respond to RN.  RN then attempted to clarify patient's decision and RN stated "so you're going to stay and wait for the doctor to see you later this morning and are not going to smoke in the hospital".  Patient continued to sit on side of bed and did not respond to RN.  Multiple times throughout this interaction RN offered nicotine patch and patient refused.  Security called and came to unit to speak with patient continued education that patient could not smoke in room/bathroom as it is dangerous and against hospital policy.  Patient stated she was going to wait for the doctor to see her.

## 2017-02-14 NOTE — Progress Notes (Signed)
Patient standing in hallway requesting to speak with RN.  Patient requested wheelchair stating she was leaving, wheelchair provided.  Patient and patient's significant other gathered all their belongings and have left unit at this time.  Security notified of patient's departure.

## 2017-02-14 NOTE — ED Triage Notes (Signed)
Pt to ED for evaluation of worsening SOB x2 weeks. Pt in [facility] ED last night but left AMA. Increased work of breathing and wheezes bilaterally noted. Skin, warm and dry. Patient ambulatory without assistance

## 2017-02-14 NOTE — Progress Notes (Signed)
RN at bedside working on documentation and patient was adamant about getting up and walking to the bathroom.  RN explained to patient that it was not safe for her to get out of bed, she had already attempted to walk to bathroom and had fallen.  Patient stated she was getting up and then proceeded to get out of bed and walk to bathroom.  RN offering contact guard assist and patient stated do not touch me.  RN explained RN was attempting to help patient stay steady on her feet patient stated again do not touch me.  Patient grabbed her jacket and went to bathroom and shut door.  AC and security updated and came to patients room.  Patient came out of bathroom and cigarette smoke smell very strong and security found a half smoked cigarette on shelf in bathroom above toilet.  Patient then went back to bed.  AC and security educated patient on no smoking hospital policy, patient stated understanding.  [facility] asked patient if patient wanted to sleep and patient shook head yes.  Bed alarm on, call light within reach.  [facility] paged and was updated.  K. Schorr returned page and was updated, no new orders received.

## 2017-02-14 NOTE — Progress Notes (Signed)
[facility] text paged via Amion with notification of patient's departure.

## 2017-02-14 NOTE — Telephone Encounter (Signed)
[facility] Post-discharge Follow-Up Phone Call:  Date of Discharge: 02/14/2017 - left  AMA Principal Discharge Diagnosis(es): asthma exacerbation.  Post-discharge Communication: (Clearly document all attempts clearly and date contact made) call placed to # [phone] (M) and a HIPAA compliant voicemail message was left requesting a call back to # [phone].  She can ask for Opal Sidles or Venetia Night. The patient needs to be reminded to go to [facility] to pick up her CPAP machine and also reminded of her appointment with Dr Jarold Song 02/19/17 @ 1400. Call Completed: NO

## 2017-02-14 NOTE — Discharge Summary (Signed)
[facility] Discharge Summary  April [name] PPI:[id number] DOB: Nov 29, 1970 DOA: 02/10/2017  PCP: Arnoldo Morale, MD  Admit date: 02/10/2017 Discharge date: 02/14/2017  Admitted From: home Disposition: Left [place] overnight 10/18-10/19  Recommendations for Outpatient Follow-up:  1. Follow up with PCP ASAP  Home Health: none  Equipment/Devices: none   Discharge Condition: guarded, left AMA CODE STATUS: Full code Diet recommendation: low calorie  HPI: Per Dr. Glyn Ade, April [name] is a 46 y.o. female with asthma, hypertension, diastolic dysfunction and sleep apnea presents to the ER because of persistent shortness of breath. Patient had been recently admitted and discharged 10 days agoand had come to the ER a few days ago and was evaluated by pulmonologist and was advised to be compliant with the medications and was discharged home presents to the ER because of worsening shortness of breath with wheezing. Patient states that since discharge her wheezing has progressively gotten more worse. Denies any chest pain productive cough fever or chills. Patient has history of noncompliance with medications and has not followed her pulmonologist for [place]. ED Course: In the ER patient continues to be wheezing and was placed on nebulizer treatment and steroids. Patient's blood pressure is uncontrolled. Patient admitted for further management of asthma exacerbation and uncontrolled blood pressure.  Hospital Course: Discharge Diagnoses:  Principal Problem:   Acute respiratory failure with hypoxia (HCC) Active Problems:   Morbid obesity with body mass index of 50.0-59.9 in adult [facility])   Asthma exacerbation in COPD ([place])   Hypertension, essential   Sleep apnea   Chronic diastolic heart failure, NYHA class 1 (HCC)   Chronic severe asthma with asthma exacerbation -Complicated by sleep apnea, morbid obesity, she had been on Xolair as an outpatient in the past, currently off, seen  by Dr. Annamaria Boots, Respiratory virus panel positive for rhinovirus, negative for flu.  Patient left AMA without this MD being able to talk the patient she left around 2:30 AM on 10/19 Strep throat -Patient tells me that prior to coming to the hospital she has been having a scratchy throat, on exam there are white exudates, rapid strep is positive, start Augmentin plan for 10-day treatment, however patient left AMA in the middle of the night so she was not prescribed any antibiotics on discharge. Morbid obesity -She is being evaluated by bariatric surgery at [facility] Hypertension -Continue home medications Depression -Continue Zoloft, BuSpar Tobacco abuse, in remission - claims that she quit, however she was noted prior to departure to have cigarettes and lighter and it appears that she wanted to go out and smoke prior to leaving [facility]  Discharge Instructions   Allergies as of 02/14/2017   No Known Allergies     Medication List    ASK your doctor about these medications   albuterol 108 (90 Base) MCG/ACT inhaler Commonly known as:  PROVENTIL HFA;VENTOLIN HFA Inhale 2 puffs into the lungs every 4 (four) hours as needed for wheezing or shortness of breath.   amLODipine 10 MG tablet Commonly known as:  NORVASC Take 1 tablet (10 mg total) by mouth daily.   arformoterol 15 MCG/2ML Nebu Commonly known as:  BROVANA Take 2 mLs (15 mcg total) by nebulization 2 (two) times daily.   budesonide 0.25 MG/2ML nebulizer solution Commonly known as:  PULMICORT Take 2 mLs (0.25 mg total) by nebulization 2 (two) times daily.   busPIRone 7.5 MG tablet Commonly known as:  BUSPAR Take 1 tablet (7.5 mg total) by mouth 3 (three) times  daily.   butalbital-acetaminophen-caffeine 50-325-40 MG tablet Commonly known as:  FIORICET, ESGIC Take 1-2 tablets by mouth every 6 (six) hours as needed for headache.   fluticasone 50 MCG/ACT nasal spray Commonly known as:  FLONASE Place 2 sprays into both nostrils  daily.   furosemide 40 MG tablet Commonly known as:  LASIX Take 1 tablet (40 mg total) by mouth daily.   guaiFENesin 600 MG 12 hr tablet Commonly known as:  MUCINEX Take 1 tablet (600 mg total) by mouth 2 (two) times daily.   loratadine 10 MG tablet Commonly known as:  CLARITIN Take 1 tablet (10 mg total) by mouth daily.   losartan 100 MG tablet Commonly known as:  COZAAR Take 1 tablet (100 mg total) by mouth daily.   metoprolol tartrate 100 MG tablet Commonly known as:  LOPRESSOR Take 1 tablet (100 mg total) by mouth 2 (two) times daily.   mometasone-formoterol 200-5 MCG/ACT Aero Commonly known as:  DULERA Inhale 2 puffs into the lungs 2 (two) times daily.   montelukast 10 MG tablet Commonly known as:  SINGULAIR Take 1 tablet (10 mg total) by mouth at bedtime.   nicotine 14 mg/24hr patch Commonly known as:  NICODERM CQ - dosed in mg/24 hours Place 1 patch (14 mg total) onto the skin daily.   omeprazole 20 MG capsule Commonly known as:  PRILOSEC Take 1 capsule (20 mg total) by mouth daily.   predniSONE 20 MG tablet Commonly known as:  DELTASONE Take 2 tablets (40 mg total) by mouth 2 (two) times daily with a meal.   sertraline 100 MG tablet Commonly known as:  ZOLOFT Take 1 tablet (100 mg total) by mouth daily.   spironolactone 100 MG tablet Commonly known as:  ALDACTONE Take 1 tablet (100 mg total) by mouth daily.   zolpidem 5 MG tablet Commonly known as:  AMBIEN Take 1 tablet (5 mg total) by mouth at bedtime as needed for sleep.      [place] Follow up.   Why:  Please go by and pick up CPAP at this location M-F.  Contact information: [address] [place] [address] [phone]           Consultations:  None   Procedures/Studies:  Dg Chest 2 View  Result Date: 02/10/2017 CLINICAL DATA:  Increasing shortness of Breath EXAM: CHEST  2 VIEW COMPARISON:  02/04/2017 FINDINGS: The heart  size and mediastinal contours are within normal limits. Both lungs are clear. The visualized skeletal structures are unremarkable. IMPRESSION: No active cardiopulmonary disease. Electronically Signed   By: Inez Catalina M.D.   On: 02/10/2017 16:14   Dg Chest 2 View  Result Date: 02/04/2017 CLINICAL DATA:  Dyspnea.  History of asthma. EXAM: CHEST  2 VIEW COMPARISON:  01/29/2017 FINDINGS: The lungs are clear. The pulmonary vasculature is normal. Heart size is normal. Hilar and mediastinal contours are unremarkable. There is no pleural effusion. IMPRESSION: No active cardiopulmonary disease. Electronically Signed   By: Andreas Newport M.D.   On: 02/04/2017 03:09   Dg Chest 2 View  Result Date: 01/26/2017 CLINICAL DATA:  Asthma EXAM: CHEST  2 VIEW COMPARISON:  Chest radiograph 01/22/2017 FINDINGS: The heart size and mediastinal contours are within normal limits. Both lungs are clear. The visualized skeletal structures are unremarkable. IMPRESSION: No active cardiopulmonary disease. Electronically Signed   By: Ulyses Jarred M.D.   On: 01/26/2017 16:58   Dg Chest 2 View  Result Date: 01/22/2017 CLINICAL DATA:  Shortness of breath for week. No chest pain. History of hypertension, on medication. EXAM: CHEST  2 VIEW COMPARISON:  Chest radiograph January 17, 2017 FINDINGS: Cardiomediastinal silhouette is normal. No pleural effusions or focal consolidations. Trachea projects midline and there is no pneumothorax. Soft tissue planes and included osseous structures are non-suspicious. Large body habitus. IMPRESSION: Stable examination:  No acute cardiopulmonary process. Electronically Signed   By: Elon Alas M.D.   On: 01/22/2017 20:48   Dg Chest 2 View  Result Date: 01/17/2017 CLINICAL DATA:  Shortness of breath for 2 days EXAM: CHEST  2 VIEW COMPARISON:  01/01/2017 FINDINGS: The heart size and mediastinal contours are within normal limits. Both lungs are clear. The visualized skeletal structures are  unremarkable. IMPRESSION: No active cardiopulmonary disease. Electronically Signed   By: Donavan Foil M.D.   On: 01/17/2017 02:00   Ct Angio Chest Pe W/cm &/or Wo Cm  Result Date: 01/27/2017 CLINICAL DATA:  Shortness of breath for 1 week. Positive D-dimer. Intermediate clinical probability. EXAM: CT ANGIOGRAPHY CHEST WITH CONTRAST TECHNIQUE: Multidetector CT imaging of the chest was performed using the standard protocol during bolus administration of intravenous contrast. Multiplanar CT image reconstructions and MIPs were obtained to evaluate the vascular anatomy. CONTRAST:  74 mL Isovue 370 COMPARISON:  10/02/2016 FINDINGS: Cardiovascular: Contrast bolus somewhat limits opacification of the pulmonary arteries but the central and proximal segmental pulmonary arteries are well opacified. No evidence of significant central pulmonary embolus. Can't exclude smaller peripheral emboli. Normal heart size. No pericardial effusion. Normal caliber thoracic aorta. No aortic dissection. Great vessel origins are patent. Mediastinum/Nodes: No enlarged mediastinal, hilar, or axillary lymph nodes. Thyroid gland, trachea, and esophagus demonstrate no significant findings. Lungs/Pleura: Evaluation of lungs is limited due to respiratory motion artifact. There is no gross consolidation or airspace disease. No pleural effusions. No pneumothorax. Upper Abdomen: No acute abnormality. Musculoskeletal: No chest wall abnormality. No acute or significant osseous findings. Review of the MIP images confirms the above findings. IMPRESSION: 1. No evidence of significant pulmonary embolus. 2. No evidence of active pulmonary disease. Electronically Signed   By: Lucienne Capers M.D.   On: 01/27/2017 01:38   Dg Chest Port 1 View  Result Date: 01/29/2017 CLINICAL DATA:  Shortness of breath. EXAM: PORTABLE CHEST 1 VIEW COMPARISON:  Chest x-ray dated January 26, 2017. FINDINGS: The cardiomediastinal silhouette is normal in size given  technique. Normal pulmonary vascularity. No focal consolidation, pleural effusion, or pneumothorax. No acute osseous abnormality. IMPRESSION: No active disease. Electronically Signed   By: Titus Dubin M.D.   On: 01/29/2017 16:16      The results of significant diagnostics from this hospitalization (including imaging, microbiology, ancillary and laboratory) are listed below for reference.     Microbiology: Recent Results (from the past 240 hour(s))  Rapid strep screen (not at [facility])     Status: Abnormal   Collection Time: 02/13/17  8:13 AM  Result Value Ref Range Status   Streptococcus, Group A Screen (Direct) POSITIVE (A) NEGATIVE Final    Comment: DELTA CHECK NOTED     Labs: BNP (last 3 results)  Recent Labs  11/13/16 1926 01/17/17 0623 02/11/17 0512  BNP 43.7 19.7 78.2   Basic Metabolic Panel:  Recent Labs Lab 02/10/17 1955 02/11/17 0210 02/11/17 0512  NA 139  --  135  K 3.0*  --  3.6  CL 100*  --  98*  CO2 29  --  22  GLUCOSE 144*  --  250*  BUN 7  --  10  CREATININE 0.77 1.01* 0.84  CALCIUM 8.5*  --  8.5*   Liver Function Tests: No results for input(s): AST, ALT, ALKPHOS, BILITOT, PROT, ALBUMIN in the last 168 hours. No results for input(s): LIPASE, AMYLASE in the last 168 hours. No results for input(s): AMMONIA in the last 168 hours. CBC:  Recent Labs Lab 02/10/17 1955 02/11/17 0210 02/11/17 0512  WBC 27.0* 26.5* 27.0*  NEUTROABS 20.0*  --   --   HGB 11.8* 12.4 12.3  HCT 37.4 39.1 38.8  MCV 88.8 88.9 88.4  PLT 294 210 330   Cardiac Enzymes:  Recent Labs Lab 02/11/17 0512  TROPONINI <0.03   BNP: Invalid input(s): POCBNP CBG: No results for input(s): GLUCAP in the last 168 hours. D-Dimer No results for input(s): DDIMER in the last 72 hours. Hgb A1c No results for input(s): HGBA1C in the last 72 hours. Lipid Profile No results for input(s): CHOL, HDL, LDLCALC, TRIG, CHOLHDL, LDLDIRECT in the last 72 hours. Thyroid function studies No  results for input(s): TSH, T4TOTAL, T3FREE, THYROIDAB in the last 72 hours.  Invalid input(s): FREET3 Anemia work up No results for input(s): VITAMINB12, FOLATE, FERRITIN, TIBC, IRON, RETICCTPCT in the last 72 hours. Urinalysis    Component Value Date/Time   COLORURINE YELLOW 01/01/2017 0935   APPEARANCEUR HAZY (A) 01/01/2017 0935   LABSPEC 1.012 01/01/2017 0935   PHURINE 5.0 01/01/2017 0935   GLUCOSEU 50 (A) 01/01/2017 0935   GLUCOSEU NEG mg/dL 10/28/2007 2049   HGBUR NEGATIVE 01/01/2017 0935   BILIRUBINUR NEGATIVE 01/01/2017 0935   KETONESUR NEGATIVE 01/01/2017 0935   PROTEINUR NEGATIVE 01/01/2017 0935   UROBILINOGEN 1.0 11/21/2014 0707   NITRITE NEGATIVE 01/01/2017 0935   LEUKOCYTESUR NEGATIVE 01/01/2017 0935   Sepsis Labs Invalid input(s): PROCALCITONIN,  WBC,  LACTICIDVEN   Time coordinating discharge: 10 minutes  SIGNED:  Marzetta Board, MD  [facility] 02/14/2017, 2:02 PM Pager [phone]  If 7PM-7AM, please contact night-coverage [email or web address] Password [email or web address]

## 2017-02-14 NOTE — Telephone Encounter (Signed)
Call received from the patient.  She said that she is " still sick" noting that she is not feeling any better and is still having difficulty breathing. When asked about leaving the hospital last night without discharge orders, she stated that she didn't know what happened and said  " I didn't mean to leave."  Explained to her that if she is still having trouble breathing she should return to the hospital/ED to be evaluated. She was in agreement and said she would go back to the ED.  She also said that she lost her dentures. Instructed her to check with the hospital about the dentures. Also instructed her about the importance of picking up her CPAP machine at [facility] at the [place] address.    She called this CM again and asked if anyone from [facility] could come to get her and take her to the ED. She said no one was available and she didn't want to call 911. Explained to her that [facility] does not take anyone to the ED and she needs to call 911 or look for someone else to take her and she said she would try to find someone to take her.   Verbal update provided to Dr Jarold Song.

## 2017-02-15 ENCOUNTER — Encounter (HOSPITAL_COMMUNITY): Payer: Self-pay

## 2017-02-15 DIAGNOSIS — J4551 Severe persistent asthma with (acute) exacerbation: Secondary | ICD-10-CM

## 2017-02-15 LAB — CBC
HEMATOCRIT: 36.7 % (ref 36.0–46.0)
HEMOGLOBIN: 11.3 g/dL — AB (ref 12.0–15.0)
MCH: 27.5 pg (ref 26.0–34.0)
MCHC: 30.8 g/dL (ref 30.0–36.0)
MCV: 89.3 fL (ref 78.0–100.0)
Platelets: 321 10*3/uL (ref 150–400)
RBC: 4.11 MIL/uL (ref 3.87–5.11)
RDW: 20.5 % — AB (ref 11.5–15.5)
WBC: 26 10*3/uL — AB (ref 4.0–10.5)

## 2017-02-15 LAB — BASIC METABOLIC PANEL
ANION GAP: 14 (ref 5–15)
BUN: 14 mg/dL (ref 6–20)
CALCIUM: 8 mg/dL — AB (ref 8.9–10.3)
CHLORIDE: 99 mmol/L — AB (ref 101–111)
CO2: 21 mmol/L — AB (ref 22–32)
Creatinine, Ser: 0.96 mg/dL (ref 0.44–1.00)
GFR calc Af Amer: >60 mL/min (ref >60)
GFR calc non Af Amer: >60 mL/min (ref >60)
GLUCOSE: 378 mg/dL — AB (ref 65–99)
Potassium: 3.7 mmol/L (ref 3.5–5.1)
Sodium: 134 mmol/L — ABNORMAL LOW (ref 135–145)

## 2017-02-15 LAB — BRAIN NATRIURETIC PEPTIDE: B Natriuretic Peptide: 15.7 pg/mL (ref 0.0–100.0)

## 2017-02-15 MED ORDER — SERTRALINE HCL 100 MG PO TABS
100.0000 mg | ORAL_TABLET | Freq: Every day | ORAL | Status: DC
  Administered 2017-02-15 – 2017-02-16 (×2): 100 mg via ORAL
  Filled 2017-02-15 (×2): qty 1

## 2017-02-15 MED ORDER — AMLODIPINE BESYLATE 10 MG PO TABS
10.0000 mg | ORAL_TABLET | Freq: Every day | ORAL | Status: DC
  Administered 2017-02-15 – 2017-02-16 (×2): 10 mg via ORAL
  Filled 2017-02-15 (×2): qty 1

## 2017-02-15 MED ORDER — SPIRONOLACTONE 25 MG PO TABS
100.0000 mg | ORAL_TABLET | Freq: Every day | ORAL | Status: DC
  Administered 2017-02-15 – 2017-02-16 (×2): 100 mg via ORAL
  Filled 2017-02-15 (×2): qty 4

## 2017-02-15 MED ORDER — FLUTICASONE PROPIONATE 50 MCG/ACT NA SUSP
2.0000 | Freq: Every day | NASAL | Status: DC
  Administered 2017-02-15 – 2017-02-16 (×2): 2 via NASAL
  Filled 2017-02-15: qty 16

## 2017-02-15 MED ORDER — LOSARTAN POTASSIUM 50 MG PO TABS
100.0000 mg | ORAL_TABLET | Freq: Every day | ORAL | Status: DC
  Administered 2017-02-15 – 2017-02-16 (×2): 100 mg via ORAL
  Filled 2017-02-15 (×2): qty 2

## 2017-02-15 MED ORDER — PREDNISONE 20 MG PO TABS
40.0000 mg | ORAL_TABLET | Freq: Every day | ORAL | Status: DC
  Administered 2017-02-15 – 2017-02-16 (×2): 40 mg via ORAL
  Filled 2017-02-15 (×2): qty 2

## 2017-02-15 MED ORDER — ENOXAPARIN SODIUM 80 MG/0.8ML ~~LOC~~ SOLN
80.0000 mg | SUBCUTANEOUS | Status: DC
  Administered 2017-02-15 – 2017-02-16 (×2): 80 mg via SUBCUTANEOUS
  Filled 2017-02-15 (×2): qty 0.8

## 2017-02-15 MED ORDER — MONTELUKAST SODIUM 10 MG PO TABS
10.0000 mg | ORAL_TABLET | Freq: Every day | ORAL | Status: DC
  Administered 2017-02-15: 10 mg via ORAL
  Filled 2017-02-15: qty 1

## 2017-02-15 MED ORDER — POTASSIUM CHLORIDE CRYS ER 20 MEQ PO TBCR
60.0000 meq | EXTENDED_RELEASE_TABLET | Freq: Two times a day (BID) | ORAL | Status: DC
  Administered 2017-02-15 – 2017-02-16 (×4): 60 meq via ORAL
  Filled 2017-02-15 (×4): qty 3

## 2017-02-15 MED ORDER — BUDESONIDE 0.25 MG/2ML IN SUSP
0.2500 mg | Freq: Two times a day (BID) | RESPIRATORY_TRACT | Status: DC
  Administered 2017-02-15 – 2017-02-16 (×2): 0.25 mg via RESPIRATORY_TRACT
  Filled 2017-02-15 (×2): qty 2

## 2017-02-15 MED ORDER — FUROSEMIDE 40 MG PO TABS
40.0000 mg | ORAL_TABLET | Freq: Every day | ORAL | Status: DC
  Administered 2017-02-15 – 2017-02-16 (×2): 40 mg via ORAL
  Filled 2017-02-15 (×2): qty 1

## 2017-02-15 MED ORDER — AMOXICILLIN-POT CLAVULANATE 875-125 MG PO TABS
1.0000 | ORAL_TABLET | Freq: Two times a day (BID) | ORAL | Status: DC
  Administered 2017-02-15 – 2017-02-16 (×3): 1 via ORAL
  Filled 2017-02-15 (×3): qty 1

## 2017-02-15 MED ORDER — OXYCODONE-ACETAMINOPHEN 5-325 MG PO TABS
2.0000 | ORAL_TABLET | Freq: Four times a day (QID) | ORAL | Status: DC | PRN
  Administered 2017-02-15: 2 via ORAL
  Filled 2017-02-15: qty 2

## 2017-02-15 MED ORDER — ARFORMOTEROL TARTRATE 15 MCG/2ML IN NEBU
15.0000 ug | INHALATION_SOLUTION | Freq: Two times a day (BID) | RESPIRATORY_TRACT | Status: DC
  Administered 2017-02-15 – 2017-02-16 (×2): 15 ug via RESPIRATORY_TRACT
  Filled 2017-02-15 (×2): qty 2

## 2017-02-15 MED ORDER — ZOLPIDEM TARTRATE 5 MG PO TABS
5.0000 mg | ORAL_TABLET | Freq: Every evening | ORAL | Status: DC | PRN
  Administered 2017-02-15 (×2): 5 mg via ORAL
  Filled 2017-02-15 (×2): qty 1

## 2017-02-15 MED ORDER — LORATADINE 10 MG PO TABS
10.0000 mg | ORAL_TABLET | Freq: Every day | ORAL | Status: DC
  Administered 2017-02-15 – 2017-02-16 (×2): 10 mg via ORAL
  Filled 2017-02-15 (×2): qty 1

## 2017-02-15 MED ORDER — MOMETASONE FURO-FORMOTEROL FUM 200-5 MCG/ACT IN AERO
2.0000 | INHALATION_SPRAY | Freq: Two times a day (BID) | RESPIRATORY_TRACT | Status: DC
  Filled 2017-02-15 (×2): qty 8.8

## 2017-02-15 MED ORDER — ALBUTEROL SULFATE (2.5 MG/3ML) 0.083% IN NEBU
2.5000 mg | INHALATION_SOLUTION | RESPIRATORY_TRACT | Status: DC | PRN
  Administered 2017-02-16: 2.5 mg via RESPIRATORY_TRACT
  Filled 2017-02-15 (×2): qty 3

## 2017-02-15 MED ORDER — NICOTINE 14 MG/24HR TD PT24
14.0000 mg | MEDICATED_PATCH | Freq: Every day | TRANSDERMAL | Status: DC
  Administered 2017-02-15 – 2017-02-16 (×2): 14 mg via TRANSDERMAL
  Filled 2017-02-15 (×2): qty 1

## 2017-02-15 MED ORDER — ALBUTEROL SULFATE (2.5 MG/3ML) 0.083% IN NEBU
2.5000 mg | INHALATION_SOLUTION | RESPIRATORY_TRACT | Status: DC
  Administered 2017-02-15 (×2): 2.5 mg via RESPIRATORY_TRACT
  Filled 2017-02-15 (×2): qty 3

## 2017-02-15 MED ORDER — BUSPIRONE HCL 15 MG PO TABS
7.5000 mg | ORAL_TABLET | Freq: Three times a day (TID) | ORAL | Status: DC
  Administered 2017-02-15 – 2017-02-16 (×5): 7.5 mg via ORAL
  Filled 2017-02-15 (×5): qty 1

## 2017-02-15 MED ORDER — ALBUTEROL SULFATE (2.5 MG/3ML) 0.083% IN NEBU
2.5000 mg | INHALATION_SOLUTION | Freq: Four times a day (QID) | RESPIRATORY_TRACT | Status: DC
  Administered 2017-02-15 – 2017-02-16 (×5): 2.5 mg via RESPIRATORY_TRACT
  Filled 2017-02-15 (×4): qty 3

## 2017-02-15 MED ORDER — METOPROLOL TARTRATE 100 MG PO TABS
100.0000 mg | ORAL_TABLET | Freq: Two times a day (BID) | ORAL | Status: DC
  Administered 2017-02-15 – 2017-02-16 (×4): 100 mg via ORAL
  Filled 2017-02-15 (×4): qty 1

## 2017-02-15 NOTE — H&P (Addendum)
History and Physical    April [name] PIR:[id number] DOB: 02/28/1971 DOA: 02/14/2017  PCP: Arnoldo Morale, MD  Patient coming from: home    Chief Complaint: difficulty breathing  HPI: April [name] is a 46 y.o. female with medical history significant for morbid obesity, asthma, smoking, hypertension, osa, recent admit for asthma exacerbation (left [facility] 24 hours ago because got in a verbal altercation w/ a staff member), returns to the hospital short of breath and wheezing. Has had this problem for about 2 weeks. Feels a little bit better now that has had breathing treatments in ED. No fever, no cough. No vomiting or diarrhea. No chest pain. No orthopnea or pnd. No swelling. No hemoptysis. Says is compliant w/ home meds  ED Course: duonebs, magnesium, methylpred, cxr, labs  Review of Systems: As per HPI otherwise 10 point review of systems negative.    Past Medical History:  Diagnosis Date  . Acanthosis nigricans   . Arthritis   . Asthma    Followed by Dr. Annamaria Boots (pulmonology); receives every other week omalizumab injections; has frequent exacerbations  . COPD (chronic obstructive pulmonary disease) ([place])    PFTs in 2002, FEV1/FVC 65, no post bronchodilater test done  . Depression   . GERD (gastroesophageal reflux disease)   . GERD (gastroesophageal reflux disease)   . Headache(784.0)   . Helicobacter pylori (H. pylori) infection   . Hypertension, essential   . Insomnia   . Menorrhagia   . Morbid obesity ([place])   . Obesity   . Seasonal allergies   . Shortness of breath   . Sleep apnea    Sleep study 2008 - mild OSA, not enough events to titrate CPAP  . Tobacco user     Past Surgical History:  Procedure Laterality Date  . BREAST REDUCTION SURGERY  09/2011  . TUBAL LIGATION  1996   bilateral     reports that she quit smoking about 2 years ago. Her smoking use included Cigarettes. She has a 9.00 pack-year smoking history. She has never used smokeless tobacco. She reports that  she does not drink alcohol or use drugs.  No Known Allergies  Family History  Problem Relation Age of Onset  . Hypertension Mother   . Asthma Daughter   . Cancer Paternal Aunt   . Asthma Maternal Grandmother     Prior to Admission medications   Medication Sig Start Date End Date Taking? Authorizing Provider  albuterol (PROVENTIL HFA;VENTOLIN HFA) 108 (90 Base) MCG/ACT inhaler Inhale 2 puffs into the lungs every 4 (four) hours as needed for wheezing or shortness of breath. 02/04/17  Yes Mu, Pilar Plate, MD  amLODipine (NORVASC) 10 MG tablet Take 1 tablet (10 mg total) by mouth daily. 12/13/16  Yes Rai, Ripudeep K, MD  arformoterol (BROVANA) 15 MCG/2ML NEBU Take 2 mLs (15 mcg total) by nebulization 2 (two) times daily. 02/05/17  Yes Amao, Charlane Ferretti, MD  budesonide (PULMICORT) 0.25 MG/2ML nebulizer solution Take 2 mLs (0.25 mg total) by nebulization 2 (two) times daily. 01/02/17  Yes Patrecia Pour, Christean Grief, MD  busPIRone (BUSPAR) 7.5 MG tablet Take 1 tablet (7.5 mg total) by mouth 3 (three) times daily. 02/05/17  Yes Arnoldo Morale, MD  butalbital-acetaminophen-caffeine (FIORICET, ESGIC) 50-325-40 MG tablet Take 1-2 tablets by mouth every 6 (six) hours as needed for headache. 12/19/16 12/19/17 Yes Joy, Shawn C, PA-C  fluticasone (FLONASE) 50 MCG/ACT nasal spray Place 2 sprays into both nostrils daily. 12/13/16  Yes Rai, Ripudeep K, MD  furosemide (LASIX) 40  MG tablet Take 1 tablet (40 mg total) by mouth daily. 01/17/17 01/17/18 Yes Short,  Delaine, MD  guaiFENesin (MUCINEX) 600 MG 12 hr tablet Take 1 tablet (600 mg total) by mouth 2 (two) times daily. 12/13/16  Yes Rai, Ripudeep K, MD  loratadine (CLARITIN) 10 MG tablet Take 1 tablet (10 mg total) by mouth daily. 08/30/15  Yes [name], MD  losartan (COZAAR) 100 MG tablet Take 1 tablet (100 mg total) by mouth daily. 12/13/16  Yes Rai, Ripudeep K, MD  metoprolol tartrate (LOPRESSOR) 100 MG tablet Take 1 tablet (100 mg total) by mouth 2 (two) times daily. 02/05/17   Yes Amao, Charlane Ferretti, MD  mometasone-formoterol (DULERA) 200-5 MCG/ACT AERO Inhale 2 puffs into the lungs 2 (two) times daily. 01/06/17  Yes Young, Tarri Fuller D, MD  montelukast (SINGULAIR) 10 MG tablet Take 1 tablet (10 mg total) by mouth at bedtime. 01/02/17  Yes Patrecia Pour, Christean Grief, MD  nicotine (NICODERM CQ - DOSED IN MG/24 HOURS) 14 mg/24hr patch Place 1 patch (14 mg total) onto the skin daily. Patient taking differently: Place 14 mg onto the skin daily as needed (smoking cessation).  10/04/16  Yes Reyne Dumas, MD  omeprazole (PRILOSEC) 20 MG capsule Take 1 capsule (20 mg total) by mouth daily. Patient taking differently: Take 20 mg by mouth 2 (two) times daily.  10/09/16  Yes Young, Tarri Fuller D, MD  sertraline (ZOLOFT) 100 MG tablet Take 1 tablet (100 mg total) by mouth daily. 12/27/16  Yes Arnoldo Morale, MD  spironolactone (ALDACTONE) 100 MG tablet Take 1 tablet (100 mg total) by mouth daily. 01/17/17  Yes Short,  Delaine, MD  zolpidem (AMBIEN) 10 MG tablet Take 10 mg by mouth at bedtime as needed for sleep.   Yes [provider]  predniSONE (DELTASONE) 20 MG tablet Take 2 tablets (40 mg total) by mouth 2 (two) times daily with a meal. Patient not taking: Reported on 02/14/2017 02/01/17   Patrecia Pour, Christean Grief, MD  zolpidem (AMBIEN) 5 MG tablet Take 1 tablet (5 mg total) by mouth at bedtime as needed for sleep. Patient not taking: Reported on 02/14/2017 12/22/16   Cristal Ford, DO    Physical Exam: Vitals:   02/14/17 1945 02/14/17 2136 02/14/17 2330 02/15/17 0037  BP: 114/75  (!) 129/97 (!) 141/78  Pulse: 93  (!) 130 (!) 105  Resp:      Temp:      TempSrc:      SpO2: 100% 100% 100% 100%  Weight:      Height:        Constitutional: NAD, calm, comfortable Vitals:   02/14/17 1945 02/14/17 2136 02/14/17 2330 02/15/17 0037  BP: 114/75  (!) 129/97 (!) 141/78  Pulse: 93  (!) 130 (!) 105  Resp:      Temp:      TempSrc:      SpO2: 100% 100% 100% 100%  Weight:      Height:        Gen: obese Eyes: no conjunctival injfectuion ENMT: Mucous membranes are moist.  Neck: normal, supple Respiratory: decreased air movement throughout, faint expiratory wheezes at apices, no rales or rhonchi Cardiovascular: tachycardic, regular rhythm, no rubs/gallops Abdomen: no tenderness, no masses palpated. No hepatosplenomegaly. Bowel sounds positive. obese Musculoskeletal: no clubbing / cyanosis.  Skin: no rashes, lesions, ulcers. No induration Neurologic: alert, moving all 4 extremities Psychiatric: Normal judgment and insight. Alert and oriented x 3. Normal mood.    Labs on Admission: I have personally reviewed following labs and  imaging studies  CBC:  Recent Labs Lab 02/10/17 1955 02/11/17 0210 02/11/17 0512 02/14/17 2105  WBC 27.0* 26.5* 27.0* 22.5*  NEUTROABS 20.0*  --   --  16.5*  HGB 11.8* 12.4 12.3 12.1  HCT 37.4 39.1 38.8 38.0  MCV 88.8 88.9 88.4 88.8  PLT 294 210 330 818   Basic Metabolic Panel:  Recent Labs Lab 02/10/17 1955 02/11/17 0210 02/11/17 0512 02/14/17 2105  NA 139  --  135 137  K 3.0*  --  3.6 3.2*  CL 100*  --  98* 102  CO2 29  --  22 24  GLUCOSE 144*  --  250* 132*  BUN 7  --  10 13  CREATININE 0.77 1.01* 0.84 0.81  CALCIUM 8.5*  --  8.5* 8.1*   GFR: Estimated Creatinine Clearance: 137.5 mL/min (by C-G formula based on SCr of 0.81 mg/dL). Liver Function Tests: No results for input(s): AST, ALT, ALKPHOS, BILITOT, PROT, ALBUMIN in the last 168 hours. No results for input(s): LIPASE, AMYLASE in the last 168 hours. No results for input(s): AMMONIA in the last 168 hours. Coagulation Profile: No results for input(s): INR, PROTIME in the last 168 hours. Cardiac Enzymes:  Recent Labs Lab 02/11/17 0512  TROPONINI <0.03   BNP (last 3 results) No results for input(s): PROBNP in the last 8760 hours. HbA1C: No results for input(s): HGBA1C in the last 72 hours. CBG: No results for input(s): GLUCAP in the last 168 hours. Lipid  Profile: No results for input(s): CHOL, HDL, LDLCALC, TRIG, CHOLHDL, LDLDIRECT in the last 72 hours. Thyroid Function Tests: No results for input(s): TSH, T4TOTAL, FREET4, T3FREE, THYROIDAB in the last 72 hours. Anemia Panel: No results for input(s): VITAMINB12, FOLATE, FERRITIN, TIBC, IRON, RETICCTPCT in the last 72 hours. Urine analysis:    Component Value Date/Time   COLORURINE YELLOW 01/01/2017 0935   APPEARANCEUR HAZY (A) 01/01/2017 0935   LABSPEC 1.012 01/01/2017 0935   PHURINE 5.0 01/01/2017 0935   GLUCOSEU 50 (A) 01/01/2017 0935   GLUCOSEU NEG mg/dL 10/28/2007 2049   HGBUR NEGATIVE 01/01/2017 0935   BILIRUBINUR NEGATIVE 01/01/2017 0935   KETONESUR NEGATIVE 01/01/2017 0935   PROTEINUR NEGATIVE 01/01/2017 0935   UROBILINOGEN 1.0 11/21/2014 0707   NITRITE NEGATIVE 01/01/2017 0935   LEUKOCYTESUR NEGATIVE 01/01/2017 0935    Radiological Exams on Admission: Dg Chest 2 View  Result Date: 02/14/2017 CLINICAL DATA:  Shortness of breath and wheezing EXAM: CHEST  2 VIEW COMPARISON:  February 10, 2017 FINDINGS: Lungs are clear. Heart size and pulmonary vascularity are normal. No adenopathy. No bone lesions. IMPRESSION: No edema or consolidation. Electronically Signed   By: Lowella Grip III M.D.   On: 02/14/2017 14:51    EKG: Independently reviewed. Tachycardic, otherwise unremarkable  Assessment/Plan Active Problems:   Morbid obesity with body mass index of 50.0-59.9 in adult [facility])   Essential hypertension   Hypokalemia   OSA (obstructive sleep apnea)   Acute asthma exacerbation  # Acute asthma exacerbation - with some improvement after ED eval. No significant tachypnea, no hypoxia. CXR clear. Respiratory panel positive recent admit for rhinovirus, flu negative - scheduled albuterol q4 hours, q2 prn - prednisone 40 mg po qd - continue home pulmicort - need to clarify home meds prior to discharge  # Hypokalemia - mild, k 3.2. Has had a lot of albuterol - 60 meq oral,  repeat bmp this AM  #HTN - here controled - continue home albuterol, losartan, metoprolol, spironolactone  # OSA - continue  home cpap qhs    DVT prophylaxis: lovenox, scds Code Status: full Family Communication: sister tiffany Sublett [phone] Disposition Plan: home Consults called: none Admission status: med/surg   Desma Maxim MD [facility] Pager [phone]  If 7PM-7AM, please contact night-coverage [email or web address] Password [email or web address]  02/15/2017, 12:54 AM

## 2017-02-15 NOTE — Progress Notes (Signed)
Pt admitted from ER to [place] via wheelchair. On arrival pt is alert and oriented x4. Pt tachy in the 100-110s, otherwise vss. Pt refusing skin assessment at this time, pt asked if she would like two female RN's instead but still refused. Pt oriented to the unit,rules, and equipment. White identification bracelet in place. Call light within place. WCTM

## 2017-02-15 NOTE — ED Provider Notes (Signed)
[place] EMERGENCY DEPARTMENT Provider Note   CSN: [id number] Arrival date & time: 02/14/17  1312     History   Chief Complaint Chief Complaint  Patient presents with  . Shortness of Breath    HPI April [name] is a 46 y.o. female with history of severe asthma with multiple exacerbations, tobacco abuse, medical noncompliance, hypertension, morbid obesity, sleep apnea, CHF presents to ED for persistent shortness of breath, wheezing and chest tightness. Per chart review, patient left AMA overnight earlier today from the hospital. She was admitted on 02/10/17 for same symptoms for management of refractory asthma symptoms and uncontrolled BP. She cannot tell me why she left AMA. Has used breathing treatment one time since leaving the hospital at home. Denies fevers, chills, chest pain.  Per chart review, patient tested positive for strep pharyngitis and rhinovirus. Negative influenza.  HPI  Past Medical History:  Diagnosis Date  . Acanthosis nigricans   . Arthritis   . Asthma    Followed by Dr. Annamaria Boots (pulmonology); receives every other week omalizumab injections; has frequent exacerbations  . COPD (chronic obstructive pulmonary disease) ([place])    PFTs in 2002, FEV1/FVC 65, no post bronchodilater test done  . Depression   . GERD (gastroesophageal reflux disease)   . GERD (gastroesophageal reflux disease)   . Headache(784.0)   . Helicobacter pylori (H. pylori) infection   . Hypertension, essential   . Insomnia   . Menorrhagia   . Morbid obesity ([place])   . Obesity   . Seasonal allergies   . Shortness of breath   . Sleep apnea    Sleep study 2008 - mild OSA, not enough events to titrate CPAP  . Tobacco user     Patient Active Problem List   Diagnosis Date Noted  . Acute respiratory failure with hypoxia ([place]) 02/10/2017  . Dyspnea 02/04/2017  . Asthma 02/04/2017  . Hypertension, essential 02/04/2017  . Sleep apnea 02/04/2017  . Morbid obesity with BMI  of 50.0-59.9, adult ([place]) 02/04/2017  . Chronic diastolic heart failure, NYHA class 1 ([place]) 02/04/2017  . Acute hyperglycemia 02/04/2017  . Chronic diastolic CHF (congestive heart failure) ([place]) 01/17/2017  . SIRS (systemic inflammatory response syndrome) ([place]) 01/17/2017  . Depression 01/17/2017  . OSA (obstructive sleep apnea) 01/17/2017  . Asthma exacerbation 01/17/2017  . SOB (shortness of breath)   . Asthma exacerbation in COPD ([place]) 12/10/2016  . Moderate persistent asthma with exacerbation 10/19/2016  . Diarrhea 10/19/2016  . Normocytic anemia 10/02/2016  . Respiratory failure ([place]) 10/02/2016  . Hypertensive urgency, malignant 06/22/2016  . Pulmonary edema 06/22/2016  . Hypertensive urgency 06/22/2016  . Malignant hypertension due to primary aldosteronism ([place]) 05/05/2016  . Lip laceration 05/05/2016  . Elevated hemoglobin A1c 05/05/2016  . GERD (gastroesophageal reflux disease) 08/30/2015  . Anxiety and depression 08/30/2015  . Prediabetes 08/22/2015  . Seasonal allergic rhinitis 08/29/2013  . Leukocytosis 10/21/2012  . Tobacco abuse 10/07/2012  . Asthma, chronic obstructive, without status asthmaticus ([place]) 05/07/2012  . Knee pain, bilateral 04/25/2011  . Primary insomnia 03/14/2011  . Mild obstructive sleep apnea 12/19/2010  . Hypokalemia 08/13/2010  . Cervical back pain with evidence of disc disease 04/08/2008  . Essential hypertension 07/31/2006  . Morbid obesity with body mass index of 50.0-59.9 in adult ([place]) 06/17/2006  . Major depressive disorder, recurrent episode ([place]) 04/10/2006    Past Surgical History:  Procedure Laterality Date  . BREAST REDUCTION SURGERY  09/2011  . [place]  bilateral    OB History    No data available       Home Medications    Prior to Admission medications   Medication Sig Start Date End Date Taking? Authorizing Provider  albuterol (PROVENTIL HFA;VENTOLIN HFA) 108 (90 Base) MCG/ACT inhaler Inhale 2 puffs  into the lungs every 4 (four) hours as needed for wheezing or shortness of breath. 02/04/17  Yes Mu, Pilar Plate, MD  amLODipine (NORVASC) 10 MG tablet Take 1 tablet (10 mg total) by mouth daily. 12/13/16  Yes Rai, Ripudeep K, MD  arformoterol (BROVANA) 15 MCG/2ML NEBU Take 2 mLs (15 mcg total) by nebulization 2 (two) times daily. 02/05/17  Yes Amao, Charlane Ferretti, MD  budesonide (PULMICORT) 0.25 MG/2ML nebulizer solution Take 2 mLs (0.25 mg total) by nebulization 2 (two) times daily. 01/02/17  Yes Patrecia Pour, Christean Grief, MD  busPIRone (BUSPAR) 7.5 MG tablet Take 1 tablet (7.5 mg total) by mouth 3 (three) times daily. 02/05/17  Yes Arnoldo Morale, MD  butalbital-acetaminophen-caffeine (FIORICET, ESGIC) 50-325-40 MG tablet Take 1-2 tablets by mouth every 6 (six) hours as needed for headache. 12/19/16 12/19/17 Yes Joy, Shawn C, PA-C  fluticasone (FLONASE) 50 MCG/ACT nasal spray Place 2 sprays into both nostrils daily. 12/13/16  Yes Rai, Ripudeep K, MD  furosemide (LASIX) 40 MG tablet Take 1 tablet (40 mg total) by mouth daily. 01/17/17 01/17/18 Yes Short, Noah Delaine, MD  guaiFENesin (MUCINEX) 600 MG 12 hr tablet Take 1 tablet (600 mg total) by mouth 2 (two) times daily. 12/13/16  Yes Rai, Ripudeep K, MD  loratadine (CLARITIN) 10 MG tablet Take 1 tablet (10 mg total) by mouth daily. 08/30/15  Yes [name], MD  losartan (COZAAR) 100 MG tablet Take 1 tablet (100 mg total) by mouth daily. 12/13/16  Yes Rai, Ripudeep K, MD  metoprolol tartrate (LOPRESSOR) 100 MG tablet Take 1 tablet (100 mg total) by mouth 2 (two) times daily. 02/05/17  Yes Amao, Charlane Ferretti, MD  mometasone-formoterol (DULERA) 200-5 MCG/ACT AERO Inhale 2 puffs into the lungs 2 (two) times daily. 01/06/17  Yes Young, Tarri Fuller D, MD  montelukast (SINGULAIR) 10 MG tablet Take 1 tablet (10 mg total) by mouth at bedtime. 01/02/17  Yes Patrecia Pour, Christean Grief, MD  nicotine (NICODERM CQ - DOSED IN MG/24 HOURS) 14 mg/24hr patch Place 1 patch (14 mg total) onto the skin daily. Patient  taking differently: Place 14 mg onto the skin daily as needed (smoking cessation).  10/04/16  Yes Reyne Dumas, MD  omeprazole (PRILOSEC) 20 MG capsule Take 1 capsule (20 mg total) by mouth daily. Patient taking differently: Take 20 mg by mouth 2 (two) times daily.  10/09/16  Yes Young, Tarri Fuller D, MD  sertraline (ZOLOFT) 100 MG tablet Take 1 tablet (100 mg total) by mouth daily. 12/27/16  Yes Arnoldo Morale, MD  spironolactone (ALDACTONE) 100 MG tablet Take 1 tablet (100 mg total) by mouth daily. 01/17/17  Yes Short, Noah Delaine, MD  zolpidem (AMBIEN) 10 MG tablet Take 10 mg by mouth at bedtime as needed for sleep.   Yes [provider]  predniSONE (DELTASONE) 20 MG tablet Take 2 tablets (40 mg total) by mouth 2 (two) times daily with a meal. Patient not taking: Reported on 02/14/2017 02/01/17   Patrecia Pour, Christean Grief, MD  zolpidem (AMBIEN) 5 MG tablet Take 1 tablet (5 mg total) by mouth at bedtime as needed for sleep. Patient not taking: Reported on 02/14/2017 12/22/16   Cristal Ford, DO    Family History Family History  Problem Relation Age  of Onset  . Hypertension Mother   . Asthma Daughter   . Cancer Paternal Aunt   . Asthma Maternal Grandmother     Social History Social History  Substance Use Topics  . Smoking status: Former Smoker    Packs/day: 0.50    Years: 18.00    Types: Cigarettes    Quit date: 04/29/2014  . Smokeless tobacco: Never Used  . Alcohol use No     Allergies   Patient has no known allergies.   Review of Systems Review of Systems  Constitutional: Negative for chills and fever.  HENT: Positive for congestion, postnasal drip, rhinorrhea, sore throat and trouble swallowing.   Respiratory: Positive for cough, chest tightness and shortness of breath.   Cardiovascular: Positive for chest pain.  Gastrointestinal: Negative for abdominal pain, diarrhea, nausea and vomiting.  Genitourinary: Negative for difficulty urinating and dysuria.  Musculoskeletal:  Negative for neck pain.  Skin: Negative for rash.  Neurological: Negative for headaches.     Physical Exam Updated Vital Signs BP 114/75   Pulse 93   Temp 98.8 F (37.1 C) (Oral)   Resp 18   Ht 5\' 6"  (1.676 m)   Wt (!) 156.9 kg (346 lb)   LMP 01/31/2017 (Approximate)   SpO2 100%   BMI 55.85 kg/m   Physical Exam  Constitutional: She is oriented to person, place, and time. She appears well-developed and well-nourished. No distress.  Morbidly obese female. NAD.  HENT:  Head: Normocephalic and atraumatic.  Nose: Nose normal.  Mouth/Throat: Oropharynx is clear and moist. No oropharyngeal exudate.  Oropharynx and tonsils erythematous without exudates Uvula midline No trismus, drooling, stridor  Eyes: Pupils are equal, round, and reactive to light. Conjunctivae and EOM are normal.  Neck: Normal range of motion. Neck supple.  No anterior cervical adenopathy   Cardiovascular: Normal rate, regular rhythm, normal heart sounds and intact distal pulses.   No murmur heard. No tachycardia. BP WNL  Pulmonary/Chest: Effort normal. No respiratory distress. She has wheezes. She has no rales.  Diffuse expiratory wheezing in all lung fields anteriorly, less posteriorly No hypoxia  Abdominal: Soft. Bowel sounds are normal. There is no tenderness.  Large abdomen. No tenderness, G/R/R  Musculoskeletal: Normal range of motion. She exhibits no deformity.  Lymphadenopathy:    She has no cervical adenopathy.  Neurological: She is alert and oriented to person, place, and time. No sensory deficit.  Skin: Skin is warm and dry. Capillary refill takes less than 2 seconds.  Psychiatric: She has a normal mood and affect. Her behavior is normal. Judgment and thought content normal.  Nursing note and vitals reviewed.    ED Treatments / Results  Labs (all labs ordered are listed, but only abnormal results are displayed) Labs Reviewed  CBC WITH DIFFERENTIAL/PLATELET - Abnormal; Notable for the  following:       Result Value   WBC 22.5 (*)    RDW 19.9 (*)    Neutro Abs 16.5 (*)    Monocytes Absolute 1.9 (*)    All other components within normal limits  BASIC METABOLIC PANEL - Abnormal; Notable for the following:    Potassium 3.2 (*)    Glucose, Bld 132 (*)    Calcium 8.1 (*)    All other components within normal limits  BRAIN NATRIURETIC PEPTIDE    EKG  EKG Interpretation None       Radiology Dg Chest 2 View  Result Date: 02/14/2017 CLINICAL DATA:  Shortness of breath and wheezing EXAM:  CHEST  2 VIEW COMPARISON:  February 10, 2017 FINDINGS: Lungs are clear. Heart size and pulmonary vascularity are normal. No adenopathy. No bone lesions. IMPRESSION: No edema or consolidation. Electronically Signed   By: Lowella Grip III M.D.   On: 02/14/2017 14:51    Procedures Procedures (including critical care time)  Medications Ordered in ED Medications  albuterol (PROVENTIL) (2.5 MG/3ML) 0.083% nebulizer solution (not administered)  albuterol (PROVENTIL,VENTOLIN) solution continuous neb (10 mg/hr Nebulization New Bag/Given 02/14/17 2136)  magnesium sulfate IVPB 2 g 50 mL (not administered)  albuterol (PROVENTIL) (2.5 MG/3ML) 0.083% nebulizer solution 5 mg (5 mg Nebulization Given 02/14/17 1337)  methylPREDNISolone sodium succinate (SOLU-MEDROL) 125 mg/2 mL injection 125 mg (125 mg Intravenous Given 02/14/17 2307)  ipratropium (ATROVENT) nebulizer solution 0.5 mg (0.5 mg Nebulization Given 02/14/17 2136)     Initial Impression / Assessment and Plan / ED Course  I have reviewed the triage vital signs and the nursing notes.  Pertinent labs & imaging results that were available during my care of the patient were reviewed by me and considered in my medical decision making (see chart for details).    Patient with chest tightness, wheezing and shortness of breath consistent with previous asthma exacerbations. She left AMA earlier this morning from hospital, she had been  admitted for management of refractory asthma and blood pressure. She was given continues reading treatments, Solu-Medrol without improvement in symptoms. No significant improvement in anterior, expiratory wheezing on repeat exam. We'll give magnesium. BNP pending. Doubt CHF exacerbation. She tested positive for strep pharyngitis and rhinovirus while inpatient. Discussed with hospitalist who will admit.  Final Clinical Impressions(s) / ED Diagnoses   Final diagnoses:  SOB (shortness of breath)    New Prescriptions New Prescriptions   No medications on file     Arlean Hopping 02/15/17 0016    Duffy Bruce, MD 02/16/17 [phone]

## 2017-02-15 NOTE — Progress Notes (Signed)
[facility]   Patient seen after midnight see H&P for further details  Patient admitted for asthma exacerbation, she has recurrent admissions for the same problem.  She left AMA 2 days ago where she was diagnosed with strep throat.  Did not get antibiotics.  will start Augmentin, will place on Brovana and Pulmicort.  Continue steroids  Rest per H&P  Chipper Oman, MD

## 2017-02-15 NOTE — ED Notes (Signed)
Admitting MD at bedside.

## 2017-02-16 DIAGNOSIS — I10 Essential (primary) hypertension: Secondary | ICD-10-CM

## 2017-02-16 DIAGNOSIS — G4733 Obstructive sleep apnea (adult) (pediatric): Secondary | ICD-10-CM | POA: Diagnosis not present

## 2017-02-16 DIAGNOSIS — Z6841 Body Mass Index (BMI) 40.0 and over, adult: Secondary | ICD-10-CM | POA: Diagnosis not present

## 2017-02-16 DIAGNOSIS — J4551 Severe persistent asthma with (acute) exacerbation: Secondary | ICD-10-CM | POA: Diagnosis not present

## 2017-02-16 MED ORDER — OXYCODONE-ACETAMINOPHEN 5-325 MG PO TABS
2.0000 | ORAL_TABLET | ORAL | Status: DC | PRN
  Administered 2017-02-16: 2 via ORAL
  Filled 2017-02-16: qty 2

## 2017-02-16 MED ORDER — AMOXICILLIN-POT CLAVULANATE 875-125 MG PO TABS: 1.0000 | ORAL_TABLET | Freq: Two times a day (BID) | ORAL | 0 refills | Status: DC

## 2017-02-16 MED ORDER — PREDNISONE 20 MG PO TABS: 40.0000 mg | ORAL_TABLET | Freq: Every day | ORAL | 0 refills | Status: DC

## 2017-02-16 NOTE — Progress Notes (Signed)
RT placed patient on CPAP. Patient tolerating well at this time. RT will monitor as needed. 

## 2017-02-16 NOTE — Progress Notes (Signed)
NURSING PROGRESS NOTE  April [name] [id number] Discharge Data: 02/16/2017 3:16 PM Attending Provider: No att. providers found [id number], Charlane Ferretti, MD   [name] to be D/C'd Home per MD order.    All IV's will be discontinued and monitored for bleeding.  All belongings will be returned to patient for patient to take home.  Last Documented Vital Signs:  Blood pressure 122/74, pulse 67, temperature 98.9 F (37.2 C), temperature source Oral, resp. rate 20, height 5\' 6"  (1.676 m), weight (!) 158.1 kg (348 lb 9.6 oz), last menstrual period 01/31/2017, SpO2 98 %.  Spike Desilets RN     .d

## 2017-02-16 NOTE — Discharge Summary (Signed)
Physician Discharge Summary  April [name]  WIO:[id number]  DOB: 26-Aug-1970  DOA: 02/14/2017 PCP: Arnoldo Morale, MD  Admit date: 02/14/2017 Discharge date: 02/16/2017  Admitted From: Home  Disposition:    Recommendations for Outpatient Follow-up:  1. Follow up with PCP in 1 weeks 2. Follow-up with pulmonary office in 1 week, patient advised to call tomorrow in the morning. 3. Please obtain BMP/CBC in 1 week to monitor WBC and renal function  Discharge Condition: Stable CODE STATUS: Full code Diet recommendation: Heart Healthy / Carb Modified  Brief/Interim Summary: For full details see H&P/progress note but in brief, April [name] is a 46 year old [name] with past medical history of asthma with recurrent admissions for noncompliant with medications, OSA who presented to the emergency department complaining of wheezing cough and difficulty breathing.  Patient was recently admitted on 10/19 when she left AMA.  During the admission patient was diagnosed with strep  throat the patient did not went home on antibiotics.  Patient now admitted for asthma exacerbation, she was treated with nebulizers and Solu-Medrol in the ED without relief.  Chest x-ray was done did not show any acute process.  Patient was restarted on antibiotic for strep throat, patient has clinically improved and breathing has returned to baseline.  Patient oxygenating above 91% on room air.  Patient has been discharged to follow-up with PCP and pulmonary office on high-dose prednisone.  Subjective: Patient seen and examined, she feels breathing okay, walking without shortness of breath or desaturation.  No wheezing on exam, not requiring oxygen supplementation.  Patient remains afebrile and tolerating diet well.  Discharge Diagnoses/Hospital Course:  Chronic Severe Asthma with exacerbation  Initially treated with IV Solu-Medrol transitioning to prednisone will continue 40 mg daily until seen by Pulm  Patient advised to  follow-up closely with pulmonary doctors and follow up instructions of medication administration. Patient also not taking her prednisone at home.  She will continue with Brovana and Pulmicort Continue Singulair and Claritin She remains on room air not requiring oxygen supplementation  Strep throat Continue Augmentin to complete total 7 days  Hypertension - continue home medication  Morbid obesity - she is being evaluated by bariatric surgery at weight for  OSA - she is encouraged to continue to use CPAP at home which will help to reduce her asthma exacerbations  Leukocytosis Steroid-induced, no left shift, patient afebrile and no signs of infection  All other chronic medical condition were stable during the hospitalization.  On the day of the discharge the patient's vitals were stable, and no other acute medical condition were reported by patient. the patient was felt safe to be discharge to home Discharge Instructions  You were cared for by a hospitalist during your hospital stay. If you have any questions about your discharge medications or the care you received while you were in the hospital after you are discharged, you can call the unit and asked to speak with the hospitalist on call if the hospitalist that took care of you is not available. Once you are discharged, your primary care physician will handle any further medical issues. Please note that NO REFILLS for any discharge medications will be authorized once you are discharged, as it is imperative that you return to your primary care physician (or establish a relationship with a primary care physician if you do not have one) for your aftercare needs so that they can reassess your need for medications and monitor your lab values.  Discharge Instructions    Call  MD for:  difficulty breathing, headache or visual disturbances    Complete by:  As directed    Call MD for:  extreme fatigue    Complete by:  As directed    Call MD for:   hives    Complete by:  As directed    Call MD for:  persistant dizziness or light-headedness    Complete by:  As directed    Call MD for:  persistant nausea and vomiting    Complete by:  As directed    Call MD for:  redness, tenderness, or signs of infection (pain, swelling, redness, odor or green/yellow discharge around incision site)    Complete by:  As directed    Call MD for:  severe uncontrolled pain    Complete by:  As directed    Call MD for:  temperature >100.4    Complete by:  As directed    Diet - low sodium heart healthy    Complete by:  As directed    Increase activity slowly    Complete by:  As directed      Allergies as of 02/16/2017   No Known Allergies     Medication List    STOP taking these medications   zolpidem 10 MG tablet Commonly known as:  AMBIEN   zolpidem 5 MG tablet Commonly known as:  AMBIEN     TAKE these medications   albuterol 108 (90 Base) MCG/ACT inhaler Commonly known as:  PROVENTIL HFA;VENTOLIN HFA Inhale 2 puffs into the lungs every 4 (four) hours as needed for wheezing or shortness of breath.   amLODipine 10 MG tablet Commonly known as:  NORVASC Take 1 tablet (10 mg total) by mouth daily.   amoxicillin-clavulanate 875-125 MG tablet Commonly known as:  AUGMENTIN Take 1 tablet by mouth every 12 (twelve) hours.   arformoterol 15 MCG/2ML Nebu Commonly known as:  BROVANA Take 2 mLs (15 mcg total) by nebulization 2 (two) times daily.   budesonide 0.25 MG/2ML nebulizer solution Commonly known as:  PULMICORT Take 2 mLs (0.25 mg total) by nebulization 2 (two) times daily.   busPIRone 7.5 MG tablet Commonly known as:  BUSPAR Take 1 tablet (7.5 mg total) by mouth 3 (three) times daily.   butalbital-acetaminophen-caffeine 50-325-40 MG tablet Commonly known as:  FIORICET, ESGIC Take 1-2 tablets by mouth every 6 (six) hours as needed for headache.   fluticasone 50 MCG/ACT nasal spray Commonly known as:  FLONASE Place 2 sprays into  both nostrils daily.   furosemide 40 MG tablet Commonly known as:  LASIX Take 1 tablet (40 mg total) by mouth daily.   guaiFENesin 600 MG 12 hr tablet Commonly known as:  MUCINEX Take 1 tablet (600 mg total) by mouth 2 (two) times daily.   loratadine 10 MG tablet Commonly known as:  CLARITIN Take 1 tablet (10 mg total) by mouth daily.   losartan 100 MG tablet Commonly known as:  COZAAR Take 1 tablet (100 mg total) by mouth daily.   metoprolol tartrate 100 MG tablet Commonly known as:  LOPRESSOR Take 1 tablet (100 mg total) by mouth 2 (two) times daily.   mometasone-formoterol 200-5 MCG/ACT Aero Commonly known as:  DULERA Inhale 2 puffs into the lungs 2 (two) times daily.   montelukast 10 MG tablet Commonly known as:  SINGULAIR Take 1 tablet (10 mg total) by mouth at bedtime.   nicotine 14 mg/24hr patch Commonly known as:  NICODERM CQ - dosed in mg/24 hours Place 1  patch (14 mg total) onto the skin daily. What changed:  when to take this  reasons to take this   omeprazole 20 MG capsule Commonly known as:  PRILOSEC Take 1 capsule (20 mg total) by mouth daily. What changed:  when to take this   predniSONE 20 MG tablet Commonly known as:  DELTASONE Take 2 tablets (40 mg total) by mouth daily with breakfast. What changed:  when to take this   sertraline 100 MG tablet Commonly known as:  ZOLOFT Take 1 tablet (100 mg total) by mouth daily.   spironolactone 100 MG tablet Commonly known as:  ALDACTONE Take 1 tablet (100 mg total) by mouth daily.      Follow-up Information    Arnoldo Morale, MD. Schedule an appointment as soon as possible for a visit in 1 week(s).   Specialty:  Family Medicine Why:  Hospital follow up Contact information: [place] [address] [phone]        Deneise Lever, MD. Schedule an appointment as soon as possible for a visit in 1 week(s).   Specialty:  Pulmonary Disease Why:  Hospital follow up  Contact  information: [address] [place] [address] [phone]          No Known Allergies  Consultations:  None    Procedures/Studies: Dg Chest 2 View  Result Date: 02/14/2017 CLINICAL DATA:  Shortness of breath and wheezing EXAM: CHEST  2 VIEW COMPARISON:  February 10, 2017 FINDINGS: Lungs are clear. Heart size and pulmonary vascularity are normal. No adenopathy. No bone lesions. IMPRESSION: No edema or consolidation. Electronically Signed   By: Lowella Grip III M.D.   On: 02/14/2017 14:51   Dg Chest 2 View  Result Date: 02/10/2017 CLINICAL DATA:  Increasing shortness of Breath EXAM: CHEST  2 VIEW COMPARISON:  02/04/2017 FINDINGS: The heart size and mediastinal contours are within normal limits. Both lungs are clear. The visualized skeletal structures are unremarkable. IMPRESSION: No active cardiopulmonary disease. Electronically Signed   By: Inez Catalina M.D.   On: 02/10/2017 16:14   Dg Chest 2 View  Result Date: 02/04/2017 CLINICAL DATA:  Dyspnea.  History of asthma. EXAM: CHEST  2 VIEW COMPARISON:  01/29/2017 FINDINGS: The lungs are clear. The pulmonary vasculature is normal. Heart size is normal. Hilar and mediastinal contours are unremarkable. There is no pleural effusion. IMPRESSION: No active cardiopulmonary disease. Electronically Signed   By: Andreas Newport M.D.   On: 02/04/2017 03:09   Dg Chest 2 View  Result Date: 01/26/2017 CLINICAL DATA:  Asthma EXAM: CHEST  2 VIEW COMPARISON:  Chest radiograph 01/22/2017 FINDINGS: The heart size and mediastinal contours are within normal limits. Both lungs are clear. The visualized skeletal structures are unremarkable. IMPRESSION: No active cardiopulmonary disease. Electronically Signed   By: Ulyses Jarred M.D.   On: 01/26/2017 16:58   Dg Chest 2 View  Result Date: 01/22/2017 CLINICAL DATA:  Shortness of breath for week. No chest pain. History of hypertension, on medication. EXAM: CHEST  2 VIEW COMPARISON:  Chest radiograph  January 17, 2017 FINDINGS: Cardiomediastinal silhouette is normal. No pleural effusions or focal consolidations. Trachea projects midline and there is no pneumothorax. Soft tissue planes and included osseous structures are non-suspicious. Large body habitus. IMPRESSION: Stable examination:  No acute cardiopulmonary process. Electronically Signed   By: Elon Alas M.D.   On: 01/22/2017 20:48   Ct Angio Chest Pe W/cm &/or Wo Cm  Result Date: 01/27/2017 CLINICAL DATA:  Shortness of breath for 1 week. Positive D-dimer. Intermediate clinical probability. EXAM: CT ANGIOGRAPHY CHEST WITH CONTRAST TECHNIQUE: Multidetector CT imaging of the chest was performed using the standard protocol during bolus administration of intravenous contrast. Multiplanar CT image reconstructions and MIPs were obtained to evaluate the vascular anatomy. CONTRAST:  74 mL Isovue 370 COMPARISON:  10/02/2016 FINDINGS: Cardiovascular: Contrast bolus somewhat limits opacification of the pulmonary arteries but the central and proximal segmental pulmonary arteries are well opacified. No evidence of significant central pulmonary embolus. Can't exclude smaller peripheral emboli. Normal heart size. No pericardial effusion. Normal caliber thoracic aorta. No aortic dissection. Great vessel origins are patent. Mediastinum/Nodes: No enlarged mediastinal, hilar, or axillary lymph nodes. Thyroid gland, trachea, and esophagus demonstrate no significant findings. Lungs/Pleura: Evaluation of lungs is limited due to respiratory motion artifact. There is no gross consolidation or airspace disease. No pleural effusions. No pneumothorax. Upper Abdomen: No acute abnormality. Musculoskeletal: No chest wall abnormality. No acute or significant osseous findings. Review of the MIP images confirms the above findings. IMPRESSION: 1. No evidence of significant pulmonary embolus. 2. No evidence of active pulmonary disease. Electronically Signed   By: Lucienne Capers  M.D.   On: 01/27/2017 01:38   Dg Chest Port 1 View  Result Date: 01/29/2017 CLINICAL DATA:  Shortness of breath. EXAM: PORTABLE CHEST 1 VIEW COMPARISON:  Chest x-ray dated January 26, 2017. FINDINGS: The cardiomediastinal silhouette is normal in size given technique. Normal pulmonary vascularity. No focal consolidation, pleural effusion, or pneumothorax. No acute osseous abnormality. IMPRESSION: No active disease. Electronically Signed   By: Titus Dubin M.D.   On: 01/29/2017 16:16    Discharge Exam: Vitals:   02/16/17 0815 02/16/17 1121  BP:    Pulse:    Resp:    Temp:    SpO2: 96% 98%   Vitals:   02/16/17 0515 02/16/17 0727 02/16/17 0815 02/16/17 1121  BP: (!) 143/88     Pulse: 72     Resp:      Temp: 97.7 F (36.5 C)     TempSrc: Oral     SpO2: 99% 95% 96% 98%  Weight:      Height:        General: Pt is alert, awake, not in acute distress Cardiovascular: RRR, S1/S2 +, no rubs, no gallops Respiratory: Good air entry, decrease at the bases due to body habitus, mild force respiratory wheezing.  Abdominal: Obese, Soft, NT, ND, bowel sounds + Extremities: no edema, no cyanosis  The results of significant diagnostics from this hospitalization (including imaging, microbiology, ancillary and laboratory) are listed below for reference.     Microbiology: Recent Results (from the past 240 hour(s))  Rapid strep screen (not at [facility])     Status: Abnormal   Collection Time: 02/13/17  8:13 AM  Result Value Ref Range Status   Streptococcus, Group A Screen (Direct) POSITIVE (A) NEGATIVE Final    Comment: DELTA CHECK NOTED     Labs: BNP (last 3 results)  Recent Labs  01/17/17 0623 02/11/17 0512 02/14/17 2105  BNP 19.7 28.9 48.5   Basic Metabolic Panel:  Recent Labs Lab 02/10/17 1955 02/11/17 0210 02/11/17 0512 02/14/17 2105 02/15/17 0345  NA 139  --  135 137 134*  K 3.0*  --  3.6 3.2* 3.7  CL 100*  --  98* 102 99*  CO2 29  --  22 24 21*  GLUCOSE 144*  --   250* 132* 378*  BUN 7  --  10 13  14  CREATININE 0.77 1.01* 0.84 0.81 0.96  CALCIUM 8.5*  --  8.5* 8.1* 8.0*   Liver Function Tests: No results for input(s): AST, ALT, ALKPHOS, BILITOT, PROT, ALBUMIN in the last 168 hours. No results for input(s): LIPASE, AMYLASE in the last 168 hours. No results for input(s): AMMONIA in the last 168 hours. CBC:  Recent Labs Lab 02/10/17 1955 02/11/17 0210 02/11/17 0512 02/14/17 2105 02/15/17 0345  WBC 27.0* 26.5* 27.0* 22.5* 26.0*  NEUTROABS 20.0*  --   --  16.5*  --   HGB 11.8* 12.4 12.3 12.1 11.3*  HCT 37.4 39.1 38.8 38.0 36.7  MCV 88.8 88.9 88.4 88.8 89.3  PLT 294 210 330 321 321   Cardiac Enzymes:  Recent Labs Lab 02/11/17 0512  TROPONINI <0.03   BNP: Invalid input(s): POCBNP CBG: No results for input(s): GLUCAP in the last 168 hours. D-Dimer No results for input(s): DDIMER in the last 72 hours. Hgb A1c No results for input(s): HGBA1C in the last 72 hours. Lipid Profile No results for input(s): CHOL, HDL, LDLCALC, TRIG, CHOLHDL, LDLDIRECT in the last 72 hours. Thyroid function studies No results for input(s): TSH, T4TOTAL, T3FREE, THYROIDAB in the last 72 hours.  Invalid input(s): FREET3 Anemia work up No results for input(s): VITAMINB12, FOLATE, FERRITIN, TIBC, IRON, RETICCTPCT in the last 72 hours. Urinalysis    Component Value Date/Time   COLORURINE YELLOW 01/01/2017 0935   APPEARANCEUR HAZY (A) 01/01/2017 0935   LABSPEC 1.012 01/01/2017 0935   PHURINE 5.0 01/01/2017 0935   GLUCOSEU 50 (A) 01/01/2017 0935   GLUCOSEU NEG mg/dL 10/28/2007 2049   HGBUR NEGATIVE 01/01/2017 0935   BILIRUBINUR NEGATIVE 01/01/2017 0935   KETONESUR NEGATIVE 01/01/2017 0935   PROTEINUR NEGATIVE 01/01/2017 0935   UROBILINOGEN 1.0 11/21/2014 0707   NITRITE NEGATIVE 01/01/2017 0935   LEUKOCYTESUR NEGATIVE 01/01/2017 0935   Sepsis Labs Invalid input(s): PROCALCITONIN,  WBC,  LACTICIDVEN Microbiology Recent Results (from the past 240  hour(s))  Rapid strep screen (not at [facility])     Status: Abnormal   Collection Time: 02/13/17  8:13 AM  Result Value Ref Range Status   Streptococcus, Group A Screen (Direct) POSITIVE (A) NEGATIVE Final    Comment: DELTA CHECK NOTED     Time coordinating discharge: 30 minutes  SIGNED:  Chipper Oman, MD  [facility] 02/16/2017, 1:23 PM  Pager please text page via  [email or web address] Password [email or web address]

## 2017-02-17 ENCOUNTER — Telehealth: Payer: Self-pay | Admitting: Internal Medicine

## 2017-02-17 ENCOUNTER — Other Ambulatory Visit: Payer: Self-pay | Admitting: Family Medicine

## 2017-02-17 ENCOUNTER — Telehealth: Payer: Self-pay

## 2017-02-17 MED ORDER — AMOXICILLIN-POT CLAVULANATE 875-125 MG PO TABS: 1.0000 | ORAL_TABLET | Freq: Two times a day (BID) | ORAL | 0 refills | Status: AC

## 2017-02-17 NOTE — Telephone Encounter (Signed)
Spoke with Opal Sidles. She was seeking clarification on patient's prednisone. Advised her that Dr. Annamaria Boots was not the prescribing provider. She stated that the patient had been released from the hospital yesterday and did not have her RXs. Dr. Jarold Song is willing to write the Augmentin RX but wanted CY to take a look at the prednisone RX.   Patient also needs an hospital follow up in one week. Left a message for patient to call back.   CY, please advise if we can send in the prednisone RX. Thanks!

## 2017-02-17 NOTE — Telephone Encounter (Addendum)
[facility] Post-discharge Follow-Up Phone Call:  Date of Discharge:02/16/2017 Principal Discharge Diagnosis(es): chronic asthma exacerbation, strep throat, HTN Post-discharge Communication: (Clearly document all attempts clearly and date contact made) call placed to # [phone] (M) and a HIPAA compliant voicemail message was left requesting a call back to # [phone] Call Completed:No

## 2017-02-17 NOTE — Telephone Encounter (Signed)
[facility] Post-discharge Follow-Up Phone Call:  Date of Discharge: 02/16/2017 Principal Discharge Diagnosis(es):  Chronic severe asthma with exacerbation.  Post-discharge Communication: (Clearly document all attempts clearly and date contact made) patient returned CM call.  Call Completed: Yes                  With Whom: patient Interpreter Needed: no     Please check all that apply:  X  Patient is knowledgeable of his/her condition(s) and/or treatment.     Patient is caring for self at home. X  Patient is receiving assist at home from family and/or caregiver. Family and/or caregiver is knowledgeable of patient's condition(s) and/or treatment. - she receives some assistance from family a significant other.  ? Patient is receiving home health services. If so, name of agency.     Medication Reconciliation:  ? Medication list reviewed with patient. X  Patient obtained all discharge medications. If not, why? - She said that she was not given any prescriptions when she was discharged  so she does not have the augmentin or prednisone. Dr Jarold Song notified. Dr Jarold Song  stated that she would order the augmentin to be sent to [facility] pharmacy. Dr Jarold Song requested clarification of the prednisone order/duration  - 40 mg daily with breakfast.. The patient is not scheduled to see Dr Annamaria Boots until 04/07/17 and the AVS instructs the patient to follow up in 1 week after discharge.    Call placed to [facility] to request clarify the prednisone order.  Spoke to [place] who stated that she would send the request to Dr Annamaria Boots. Informed her that the patient did not receive a prescription at discharge yesterday. Cherina also stated that she would call the patient to schedule a follow up appointment as the appointment for 03/2017 was scheduled prior to this hospitalization.    Activities of Daily Living:  X  Independent  -some assist from PCS ? Needs assist (describe; ? home DME used) ? Total Care  (describe, ? home DME used)   Community resources in place for patient:   X  PCS services  - 80 hours/month. ? Home Health/Home DME ? Assisted Living ? Support Group          Patient Education: Instructed the patient to pick up her CPAP machine at [facility] on [address]. She has not had a [employer] transportation assessment due to her hospitalizations. Instructed her to contact [employer]. She stated that she needs a ride to her appointment with Dr Jarold Song on 02/19/17 @ 1400.  She asked if [facility] could pay for her transportation to her appointment with [facility] weight loss appointment. Explained that during the TCC period of 30 days post hospitalization, [place] will only pay for cab ride to/from her appointments at [facility]. Sh said that she has appointments there tomorrow, 02/18/17  and Wednesday, 02/19/17 after her appointment at [facility]. Encouraged her to call medicaid transportation and explain that she was in the hospital until yesterday.          Questions/Concerns discussed: No other concerns reported at this time.  She stated that she is " feeling better."

## 2017-02-18 ENCOUNTER — Telehealth: Payer: Self-pay

## 2017-02-18 MED ORDER — PREDNISONE 20 MG PO TABS
40.0000 mg | ORAL_TABLET | Freq: Every day | ORAL | 0 refills | Status: DC
Start: 2017-02-18 — End: 2017-03-29

## 2017-02-18 NOTE — Telephone Encounter (Signed)
Ok to rewrite the prednisone script she had before hospital.

## 2017-02-18 NOTE — Telephone Encounter (Signed)
Call received from [place] with [facility].  She stated that the order for prednisone has been sent in. She also noted that she left a message yesterday for the patient in attempt to schedule a follow up appointment.     This CM attempted to contact the patient to inform her that the orders for augmentin and prednisone have been sent to [facility].  Also to confirm her appointment with Dr Jarold Song tomorrow  - 02/19/17 @ 1400 and to inform her that a cab has been ordered to transport her to the clinic.  Call placed to # [phone] (M) and a HIPAA compliant voicemail message was left requesting a call back to # [phone].

## 2017-02-18 NOTE — Telephone Encounter (Signed)
April [name] at [employer] is returning [employer] from today. [phone]

## 2017-02-18 NOTE — Telephone Encounter (Signed)
Call returned to [facility] at [facility]. Left message with Tammy requesting call back from [facility]

## 2017-02-18 NOTE — Telephone Encounter (Signed)
lmtcb x1 for Opal Sidles.  Rx for prednisone 40mg  daily has been sent to preferred pharmacy, as previously prescribed by ED physician.

## 2017-02-18 NOTE — Telephone Encounter (Signed)
Spoke with April [name]. Advised her that CY stated it was ok for [place] to reorder the prednisone with his name. RX has already been called in. Advised her that we are still waiting on the patient to call back in regards to her hospital follow up. April [name] stated that she will try to reach out to the patient as well. Patient has a hard time getting transportation scheduled so the appointment needs to be at least a week in advance.   Will leave this message open until we hear about back from patient.

## 2017-02-19 ENCOUNTER — Ambulatory Visit: Payer: Medicaid Other | Admitting: Family Medicine

## 2017-02-19 ENCOUNTER — Telehealth: Payer: Self-pay

## 2017-02-19 NOTE — Telephone Encounter (Signed)
Attempted again to contact the patient to remind her of her appointment today at [facility] @ 1400 and notify her that cab transportation to the clinic has been arranged to pick her up at 1330. Also to inform her that the prednisone and augmentin are ready for pick up at [place].  Call placed to # [phone] (M) and a HIPAA compliant voicemail message was left requesting a call back to # [phone].

## 2017-02-19 NOTE — Telephone Encounter (Signed)
Correction to note dated 02/19/17 from this CM.  The call to the patient was dropped after 7 rings, unable to leave voicemail message. A second call was placed to the same number and the call was again dropped after 7 rings.

## 2017-02-21 ENCOUNTER — Telehealth: Payer: Self-pay | Admitting: Family Medicine

## 2017-02-21 NOTE — Telephone Encounter (Signed)
Call placed to patient [phone], to reschedule her appointment and to check on her status. No answer. Left patient a message asking her to return my call at [phone].

## 2017-02-24 NOTE — Telephone Encounter (Signed)
lmtcb for pt.  

## 2017-02-26 NOTE — Telephone Encounter (Signed)
Waiting for the pt to call back for appt to be scheduled.

## 2017-02-27 ENCOUNTER — Telehealth: Payer: Self-pay | Admitting: Family Medicine

## 2017-02-27 NOTE — Telephone Encounter (Signed)
Call placed to patient # [phone] to check on her status, check if she has been taking her medications and picked her CPAP. No answer. Left patient a message asking her to call back at [phone].

## 2017-02-28 NOTE — Telephone Encounter (Signed)
We have been unable to contact the pt to reschedule her appt with CY.  Will sign off of this message at this time.

## 2017-03-03 ENCOUNTER — Telehealth: Payer: Self-pay | Admitting: Family Medicine

## 2017-03-03 ENCOUNTER — Telehealth: Payer: Self-pay | Admitting: Internal Medicine

## 2017-03-03 ENCOUNTER — Telehealth: Payer: Self-pay

## 2017-03-03 NOTE — Telephone Encounter (Signed)
Message received that the patient was returning CM call.   Call placed to the patient. She stated that her phone has been off but is now working. She explained that she is feeling " much better.. No wheezing."  When asked if she has been taking her medications, she said that she has not had any steroids and did not  pick up the prednisone or antibiotic at the pharmacy despite being instructed to do so.  She stated that she wanted to schedule an appointment with Dr Jarold Song because she is feeling " weak."  She did not provide any information other than stating that " something is making me feel weak."  An appointment was scheduled with Dr Jarold Song - 03/12/17 @ 1400.  She also explained that she went to [facility] today to pick up her CPAP machine but was told that she needed to schedule an appointment to pick it up so that she could be instructed how to use/care for the machine.  She said that she will follow up an call to schedule that appointment.   No other complaints reported at this time.

## 2017-03-03 NOTE — Telephone Encounter (Signed)
Call placed to patient [phone] to check on her status and schedule a hospital f/u appointment. No answer. Left patient a message to return my call at [phone].

## 2017-03-03 NOTE — Telephone Encounter (Signed)
Paperwork has been received. paperwork will be filled out once PCP returns.

## 2017-03-03 NOTE — Telephone Encounter (Signed)
I haven't seen or heard from April [name]. She was discharged from the hosp. 02/16/17. I called pt. To remind her to come in and fill out a PAN form as she is able, as her's has exp.[name]

## 2017-03-03 NOTE — Telephone Encounter (Signed)
[facility] called to speak with the nurse since she drop ICD-19 papers dfor the provider to signed over a 1 weeks, shwe is checking on the statu of the papers, please follow up

## 2017-03-03 NOTE — Telephone Encounter (Signed)
Pt called back, please f/up  °

## 2017-03-11 ENCOUNTER — Telehealth: Payer: Self-pay

## 2017-03-11 NOTE — Telephone Encounter (Signed)
Noted  

## 2017-03-11 NOTE — Telephone Encounter (Signed)
Call received from the patient. She confirmed her appointment for tomorrow and also confirmed that she has transportation to the clinic.  Reminded her to bring her medications with her to the clinic.   She noted that she is " feeling fine" and stated that her asthma has been " okay" but she has been experiencing knee pain . She explained that her knees are  painful at times and  it is difficult to walk but she will be at her appointment.

## 2017-03-11 NOTE — Telephone Encounter (Signed)
Attempted to contact the patient to check on her and to confirm her appointment for tomorrow - 03/12/17 @ 1400. Call placed to # [phone] (M) and a HIPAA compliant voicemail message was left requesting a call back to # [phone].

## 2017-03-12 ENCOUNTER — Ambulatory Visit: Payer: Medicaid Other | Attending: Family Medicine | Admitting: Family Medicine

## 2017-03-12 ENCOUNTER — Telehealth: Payer: Self-pay

## 2017-03-12 VITALS — BP 121/74 | HR 101 | Temp 98.1°F | Ht 66.0 in | Wt 341.6 lb

## 2017-03-12 DIAGNOSIS — Z6841 Body Mass Index (BMI) 40.0 and over, adult: Secondary | ICD-10-CM | POA: Insufficient documentation

## 2017-03-12 DIAGNOSIS — M25562 Pain in left knee: Secondary | ICD-10-CM | POA: Insufficient documentation

## 2017-03-12 DIAGNOSIS — I10 Essential (primary) hypertension: Secondary | ICD-10-CM | POA: Insufficient documentation

## 2017-03-12 DIAGNOSIS — K219 Gastro-esophageal reflux disease without esophagitis: Secondary | ICD-10-CM | POA: Insufficient documentation

## 2017-03-12 DIAGNOSIS — F32A Depression, unspecified: Secondary | ICD-10-CM

## 2017-03-12 DIAGNOSIS — M25561 Pain in right knee: Secondary | ICD-10-CM | POA: Diagnosis not present

## 2017-03-12 DIAGNOSIS — J449 Chronic obstructive pulmonary disease, unspecified: Secondary | ICD-10-CM | POA: Insufficient documentation

## 2017-03-12 DIAGNOSIS — G8929 Other chronic pain: Secondary | ICD-10-CM | POA: Diagnosis not present

## 2017-03-12 DIAGNOSIS — G4700 Insomnia, unspecified: Secondary | ICD-10-CM | POA: Diagnosis not present

## 2017-03-12 DIAGNOSIS — G4733 Obstructive sleep apnea (adult) (pediatric): Secondary | ICD-10-CM | POA: Insufficient documentation

## 2017-03-12 DIAGNOSIS — F419 Anxiety disorder, unspecified: Secondary | ICD-10-CM | POA: Diagnosis not present

## 2017-03-12 DIAGNOSIS — R7303 Prediabetes: Secondary | ICD-10-CM | POA: Diagnosis not present

## 2017-03-12 DIAGNOSIS — Z79899 Other long term (current) drug therapy: Secondary | ICD-10-CM | POA: Insufficient documentation

## 2017-03-12 DIAGNOSIS — F329 Major depressive disorder, single episode, unspecified: Secondary | ICD-10-CM | POA: Diagnosis not present

## 2017-03-12 MED ORDER — LOSARTAN POTASSIUM 100 MG PO TABS: 100.0000 mg | ORAL_TABLET | Freq: Every day | ORAL | 3 refills | Status: DC

## 2017-03-12 MED ORDER — BUDESONIDE 0.25 MG/2ML IN SUSP: 0.2500 mg | Freq: Two times a day (BID) | RESPIRATORY_TRACT | 1 refills | Status: DC

## 2017-03-12 MED ORDER — AMLODIPINE BESYLATE 10 MG PO TABS: 10.0000 mg | ORAL_TABLET | Freq: Every day | ORAL | 3 refills | Status: DC

## 2017-03-12 MED ORDER — FUROSEMIDE 40 MG PO TABS: 40.0000 mg | ORAL_TABLET | Freq: Every day | ORAL | 3 refills | Status: DC

## 2017-03-12 MED ORDER — BUSPIRONE HCL 7.5 MG PO TABS
7.5000 mg | ORAL_TABLET | Freq: Two times a day (BID) | ORAL | 3 refills | Status: DC
Start: 2017-03-12 — End: 2017-08-18

## 2017-03-12 MED ORDER — SERTRALINE HCL 100 MG PO TABS: 100.0000 mg | ORAL_TABLET | Freq: Every day | ORAL | 3 refills | Status: DC

## 2017-03-12 MED ORDER — ALBUTEROL SULFATE HFA 108 (90 BASE) MCG/ACT IN AERS: 2.0000 | INHALATION_SPRAY | RESPIRATORY_TRACT | 3 refills | Status: DC | PRN

## 2017-03-12 MED ORDER — ARFORMOTEROL TARTRATE 15 MCG/2ML IN NEBU: 15.0000 ug | INHALATION_SOLUTION | Freq: Two times a day (BID) | RESPIRATORY_TRACT | 1 refills | Status: DC

## 2017-03-12 MED ORDER — MONTELUKAST SODIUM 10 MG PO TABS: 10.0000 mg | ORAL_TABLET | Freq: Every day | ORAL | 3 refills | Status: DC

## 2017-03-12 MED ORDER — SPIRONOLACTONE 100 MG PO TABS: 100.0000 mg | ORAL_TABLET | Freq: Every day | ORAL | 3 refills | Status: DC

## 2017-03-12 MED ORDER — DICLOFENAC SODIUM 75 MG PO TBEC: 75.0000 mg | DELAYED_RELEASE_TABLET | Freq: Two times a day (BID) | ORAL | 1 refills | Status: DC

## 2017-03-12 MED FILL — PULMICORT 0.25 MG/2 ML RESP: 0.25 | 15 days supply | Qty: 60 | Fill #0

## 2017-03-12 MED FILL — MONTELUKAST SOD 10 MG TAB: 10 | 30 days supply | Qty: 30 | Fill #0

## 2017-03-12 MED FILL — SERTRALINE HCL 100 MG TAB: 100 | 30 days supply | Qty: 30 | Fill #0

## 2017-03-12 MED FILL — AMLODIPINE BESYLATE 10 MG T: 10 | 30 days supply | Qty: 30 | Fill #0

## 2017-03-12 MED FILL — PROVENTIL HFA 108 (90 BASE): 108 (90 BAS | 16 days supply | Qty: 7 | Fill #0

## 2017-03-12 MED FILL — FUROSEMIDE 40 MG TAB: 40 | 30 days supply | Qty: 30 | Fill #0

## 2017-03-12 MED FILL — LOSARTAN POTASSIUM 100 MG T: 100 | 30 days supply | Qty: 30 | Fill #0

## 2017-03-12 MED FILL — DICLOFENAC SODIUM 75 MG TAB: 75 | 30 days supply | Qty: 60 | Fill #0

## 2017-03-12 MED FILL — busPIRone HCL 7.5 MG TABS: 7.5 | 30 days supply | Qty: 60 | Fill #0

## 2017-03-12 MED FILL — SPIRONOLACTONE 100 MG TAB: 100 | 30 days supply | Qty: 30 | Fill #0

## 2017-03-12 NOTE — Patient Instructions (Signed)
Knee Pain, Adult Many things can cause knee pain. The pain often goes away on its own with time and rest. If the pain does not go away, tests may be done to find out what is causing the pain. Follow these instructions at home: Activity  Rest your knee.  Do not do things that cause pain.  Avoid activities where both feet leave the ground at the same time (high-impact activities). Examples are running, jumping rope, and doing jumping jacks. General instructions  Take medicines only as told by your doctor.  Raise (elevate) your knee when you are resting. Make sure your knee is higher than your heart.  Sleep with a pillow under your knee.  If told, put ice on the knee: ? Put ice in a plastic bag. ? Place a towel between your skin and the bag. ? Leave the ice on for 20 minutes, 2-3 times a day.  Ask your doctor if you should wear an elastic knee support.  Lose weight if you are overweight. Being overweight can make your knee hurt more.  Do not use any tobacco products. These include cigarettes, chewing tobacco, or electronic cigarettes. If you need help quitting, ask your doctor. Smoking may slow down healing. Contact a doctor if:  The pain does not stop.  The pain changes or gets worse.  You have a fever along with knee pain.  Your knee gives out or locks up.  Your knee swells, and becomes worse. Get help right away if:  Your knee feels warm.  You cannot move your knee.  You have very bad knee pain.  You have chest pain.  You have trouble breathing. Summary  Many things can cause knee pain. The pain often goes away on its own with time and rest.  Avoid activities that put stress on your knee. These include running and jumping rope.  Get help right away if you cannot move your knee, or if your knee feels warm, or if you have trouble breathing. This information is not intended to replace advice given to you by your health care provider. Make sure you discuss any  questions you have with your health care provider. Document Released: 07/12/2008 Document Revised: 04/09/2016 Document Reviewed: 04/09/2016 Elsevier Interactive Patient Education  2017 Elsevier Inc.  

## 2017-03-12 NOTE — Telephone Encounter (Signed)
Met with the patient when she was in the clinic today. She confirmed that she picked up the CPAP machine at [facility].    Provided her with information about the monthly food delivery at [facility].   She also spoke about not being able to see her grandchildren as they have been living with their other grandmother and she just wants to see them . She noted that her daughter has not assisted her with being able to see the children.  She said that she has  contacted social services but is not sure of the next steps. She denied that this was a crisis situation or that the children were in any danger. Provided her with the contact information for [employer] [place] as well as [facility] [place]. She was very appreciative of the information.

## 2017-03-12 NOTE — Progress Notes (Signed)
Subjective:  Patient ID: April [name], female    DOB: 1970/12/30  Age: 46 y.o. MRN: [id number]  CC: Asthma and Knee Pain   HPI April [name] is a 46 year old female with a history of morbid obesity, major depressive disorder, hypertension, Pre Diabetes (A1c 6.1), Asthma (On Xolair injections every 2 weeks), poor compliance with inhalers, obstructive sleep apnea (on CPAP) with multiple hospitalizations for asthma exacerbation here for a follow-up visit.    Last hospitalization was from 02/14/17 through 02/16/17 at which time she was treated for strep throat as well as asthma exacerbation and was supposed to be discharged on Augmentin which she states she never received. She was also discharged with a prednisone taper.  She is followed by the [facility] bariatric clinic for management of her obesity and weight loss medications pending stabilization of her medical condition.  She presents today reporting she feels the best she has ever felt in a long while.  She is not taking her prednisone is been compliant with her inhalers and denies any shortness of breath, chest pains or wheezing and has an upcoming appointment with her pulmonologist.  Her major concern today is bilateral knee pain 10/10 associated swelling which is worsening.  She does not have any medications for pain. CT of the left knee done in 09/2016 revealed markedly advanced for age tricompartmental osteoarthritis. Was previously told by orthopedics she would need knee replacements but would need to lose weight.  Past Medical History:  Diagnosis Date  . Acanthosis nigricans   . Arthritis   . Asthma    Followed by Dr. Annamaria Boots (pulmonology); receives every other week omalizumab injections; has frequent exacerbations  . COPD (chronic obstructive pulmonary disease) ([place])    PFTs in 2002, FEV1/FVC 65, no post bronchodilater test done  . Depression   . GERD (gastroesophageal reflux disease)   . GERD (gastroesophageal reflux  disease)   . Headache(784.0)   . Helicobacter pylori (H. pylori) infection   . Hypertension, essential   . Insomnia   . Menorrhagia   . Morbid obesity ([place])   . Obesity   . Seasonal allergies   . Shortness of breath   . Sleep apnea    Sleep study 2008 - mild OSA, not enough events to titrate CPAP  . Tobacco user     Past Surgical History:  Procedure Laterality Date  . BREAST REDUCTION SURGERY  09/2011  . TUBAL LIGATION  1996   bilateral    No Known Allergies   Outpatient Medications Prior to Visit  Medication Sig Dispense Refill  . butalbital-acetaminophen-caffeine (FIORICET, ESGIC) 50-325-40 MG tablet Take 1-2 tablets by mouth every 6 (six) hours as needed for headache. 20 tablet 0  . fluticasone (FLONASE) 50 MCG/ACT nasal spray Place 2 sprays into both nostrils daily. 16 g 6  . loratadine (CLARITIN) 10 MG tablet Take 1 tablet (10 mg total) by mouth daily. 90 tablet 3  . metoprolol tartrate (LOPRESSOR) 100 MG tablet Take 1 tablet (100 mg total) by mouth 2 (two) times daily. 60 tablet 3  . mometasone-formoterol (DULERA) 200-5 MCG/ACT AERO Inhale 2 puffs into the lungs 2 (two) times daily. 1 Inhaler 0  . nicotine (NICODERM CQ - DOSED IN MG/24 HOURS) 14 mg/24hr patch Place 1 patch (14 mg total) onto the skin daily. (Patient taking differently: Place 14 mg onto the skin daily as needed (smoking cessation). ) 28 patch 0  . omeprazole (PRILOSEC) 20 MG capsule Take 1 capsule (20 mg  total) by mouth daily. (Patient taking differently: Take 20 mg by mouth 2 (two) times daily. ) 30 capsule 11  . albuterol (PROVENTIL HFA;VENTOLIN HFA) 108 (90 Base) MCG/ACT inhaler Inhale 2 puffs into the lungs every 4 (four) hours as needed for wheezing or shortness of breath. 1 Inhaler 3  . amLODipine (NORVASC) 10 MG tablet Take 1 tablet (10 mg total) by mouth daily. 30 tablet 4  . busPIRone (BUSPAR) 7.5 MG tablet Take 1 tablet (7.5 mg total) by mouth 3 (three) times daily. 60 tablet 3  . furosemide  (LASIX) 40 MG tablet Take 1 tablet (40 mg total) by mouth daily. 30 tablet 0  . losartan (COZAAR) 100 MG tablet Take 1 tablet (100 mg total) by mouth daily. 30 tablet 3  . montelukast (SINGULAIR) 10 MG tablet Take 1 tablet (10 mg total) by mouth at bedtime. 30 tablet 0  . sertraline (ZOLOFT) 100 MG tablet Take 1 tablet (100 mg total) by mouth daily. 60 tablet 3  . spironolactone (ALDACTONE) 100 MG tablet Take 1 tablet (100 mg total) by mouth daily. 30 tablet 0  . guaiFENesin (MUCINEX) 600 MG 12 hr tablet Take 1 tablet (600 mg total) by mouth 2 (two) times daily. (Patient not taking: Reported on 03/12/2017) 30 tablet 0  . predniSONE (DELTASONE) 20 MG tablet Take 2 tablets (40 mg total) by mouth daily with breakfast. (Patient not taking: Reported on 03/12/2017) 120 tablet 0  . arformoterol (BROVANA) 15 MCG/2ML NEBU Take 2 mLs (15 mcg total) by nebulization 2 (two) times daily. (Patient not taking: Reported on 03/12/2017) 120 mL 0  . budesonide (PULMICORT) 0.25 MG/2ML nebulizer solution Take 2 mLs (0.25 mg total) by nebulization 2 (two) times daily. (Patient not taking: Reported on 03/12/2017) 60 mL 0   No facility-administered medications prior to visit.     ROS Review of Systems  Constitutional: Negative for activity change, appetite change and fatigue.  HENT: Negative for congestion, sinus pressure and sore throat.   Eyes: Negative for visual disturbance.  Respiratory: Negative for cough, chest tightness, shortness of breath and wheezing.   Cardiovascular: Negative for chest pain and palpitations.  Gastrointestinal: Negative for abdominal distention, abdominal pain and constipation.  Endocrine: Negative for polydipsia.  Genitourinary: Negative for dysuria and frequency.  Musculoskeletal:       See hpi  Skin: Negative for rash.  Neurological: Negative for tremors, light-headedness and numbness.  Hematological: Does not bruise/bleed easily.  Psychiatric/Behavioral: Negative for agitation  and behavioral problems.    Objective:  BP 121/74   Pulse (!) 101   Temp 98.1 F (36.7 C) (Oral)   Ht 5\' 6"  (1.676 m)   Wt (!) 341 lb 9.6 oz (154.9 kg)   SpO2 98%   BMI 55.14 kg/m   BP/Weight 03/12/2017 02/16/2017 68/34/1962  Systolic BP 229 798 -  Diastolic BP 74 74 -  Wt. (Lbs) 341.6 - 348.6  BMI 55.14 - -  Some encounter information is confidential and restricted. Go to Review Flowsheets activity to see all data.      Physical Exam  Constitutional: She is oriented to person, place, and time. She appears well-developed and well-nourished.  Cardiovascular: Normal rate, normal heart sounds and intact distal pulses.  No murmur heard. Pulmonary/Chest: Effort normal and breath sounds normal. She has no wheezes. She has no rales. She exhibits no tenderness.  Abdominal: Soft. Bowel sounds are normal. She exhibits no distension and no mass. There is no tenderness.  Musculoskeletal:  Bilateral varus deformities  of knees, mild edema bilaterally Tenderness on range of motion with associated crepitus  Neurological: She is alert and oriented to person, place, and time.  Skin: Skin is warm and dry.  Psychiatric: She has a normal mood and affect.     Lab Results  Component Value Date   HGBA1C 6.1 (H) 02/04/2017    Assessment & Plan:   1. Asthma, chronic obstructive, without status asthmaticus ([place]) Controlled Keep appointment with pulmonary - montelukast (SINGULAIR) 10 MG tablet; Take 1 tablet (10 mg total) at bedtime by mouth.  Dispense: 30 tablet; Refill: 3 - budesonide (PULMICORT) 0.25 MG/2ML nebulizer solution; Take 2 mLs (0.25 mg total) 2 (two) times daily by nebulization.  Dispense: 60 mL; Refill: 1 - arformoterol (BROVANA) 15 MCG/2ML NEBU; Take 2 mLs (15 mcg total) 2 (two) times daily by nebulization.  Dispense: 120 mL; Refill: 1 - albuterol (PROVENTIL HFA;VENTOLIN HFA) 108 (90 Base) MCG/ACT inhaler; Inhale 2 puffs every 4 (four) hours as needed into the lungs for  wheezing or shortness of breath.  Dispense: 1 Inhaler; Refill: 3  2. Anxiety and depression Stable - sertraline (ZOLOFT) 100 MG tablet; Take 1 tablet (100 mg total) daily by mouth.  Dispense: 60 tablet; Refill: 3 - busPIRone (BUSPAR) 7.5 MG tablet; Take 1 tablet (7.5 mg total) 2 (two) times daily by mouth.  Dispense: 60 tablet; Refill: 3  3. Essential hypertension Controlled Low sodium, DASH diet - spironolactone (ALDACTONE) 100 MG tablet; Take 1 tablet (100 mg total) daily by mouth.  Dispense: 30 tablet; Refill: 3 - losartan (COZAAR) 100 MG tablet; Take 1 tablet (100 mg total) daily by mouth.  Dispense: 30 tablet; Refill: 3 - amLODipine (NORVASC) 10 MG tablet; Take 1 tablet (10 mg total) daily by mouth.  Dispense: 30 tablet; Refill: 3  4. Chronic pain of both knees Might benefit from knee replacement surgery - AMB referral to orthopedics - diclofenac (VOLTAREN) 75 MG EC tablet; Take 1 tablet (75 mg total) 2 (two) times daily by mouth.  Dispense: 60 tablet; Refill: 1  5. Obstructive sleep apnea syndrome Continue CPAP  6. Morbid obesity with BMI of 50.0-59.9, adult (HCC) Reduce portion sizes, exercise is limited due to pain in knees Follow-up with weight loss clinic   Meds ordered this encounter  Medications  . sertraline (ZOLOFT) 100 MG tablet    Sig: Take 1 tablet (100 mg total) daily by mouth.    Dispense:  60 tablet    Refill:  3    Discontinue previous dose  . spironolactone (ALDACTONE) 100 MG tablet    Sig: Take 1 tablet (100 mg total) daily by mouth.    Dispense:  30 tablet    Refill:  3  . montelukast (SINGULAIR) 10 MG tablet    Sig: Take 1 tablet (10 mg total) at bedtime by mouth.    Dispense:  30 tablet    Refill:  3  . losartan (COZAAR) 100 MG tablet    Sig: Take 1 tablet (100 mg total) daily by mouth.    Dispense:  30 tablet    Refill:  3  . furosemide (LASIX) 40 MG tablet    Sig: Take 1 tablet (40 mg total) daily by mouth.    Dispense:  30 tablet     Refill:  3  . busPIRone (BUSPAR) 7.5 MG tablet    Sig: Take 1 tablet (7.5 mg total) 2 (two) times daily by mouth.    Dispense:  60 tablet    Refill:  3  . budesonide (PULMICORT) 0.25 MG/2ML nebulizer solution    Sig: Take 2 mLs (0.25 mg total) 2 (two) times daily by nebulization.    Dispense:  60 mL    Refill:  1  . arformoterol (BROVANA) 15 MCG/2ML NEBU    Sig: Take 2 mLs (15 mcg total) 2 (two) times daily by nebulization.    Dispense:  120 mL    Refill:  1  . amLODipine (NORVASC) 10 MG tablet    Sig: Take 1 tablet (10 mg total) daily by mouth.    Dispense:  30 tablet    Refill:  3  . albuterol (PROVENTIL HFA;VENTOLIN HFA) 108 (90 Base) MCG/ACT inhaler    Sig: Inhale 2 puffs every 4 (four) hours as needed into the lungs for wheezing or shortness of breath.    Dispense:  1 Inhaler    Refill:  3  . diclofenac (VOLTAREN) 75 MG EC tablet    Sig: Take 1 tablet (75 mg total) 2 (two) times daily by mouth.    Dispense:  60 tablet    Refill:  1    Follow-up: Return in about 1 month (around 04/11/2017) for Follow-up on asthma.   Arnoldo Morale MD

## 2017-03-14 ENCOUNTER — Encounter: Payer: Self-pay | Admitting: Family Medicine

## 2017-03-14 ENCOUNTER — Telehealth: Payer: Self-pay

## 2017-03-14 ENCOUNTER — Telehealth: Payer: Self-pay | Admitting: Family Medicine

## 2017-03-14 NOTE — Telephone Encounter (Signed)
Faxed prescription and patient's information to [facility] for patient's bilateral knee braces after speaking with Darrell from [facility] yesterday 11/15. Darrell was not sure if they carry them but asked that I call after faxed has been sent to them to confirm.

## 2017-03-14 NOTE — Telephone Encounter (Signed)
Fax received from [facility] regarding PCS for the patient.  The patient is already receiving PCS services. The call is made to clarify why this fax was sent.   Attempted to contact Briana # [phone] and the message stated that the person at extension [phone] was not available. Message left requesting call back to # [phone] and ask for Opal Sidles or Venetia Night. Call placed to [facility] # [phone] as this name is noted on the PCS form. Spoke to [place] who stated that she would give the message to [place] to return the call.

## 2017-03-15 IMAGING — CR DG CHEST 2V
2 series · 2 of 2 positions shown · non-contrast
Comparison: January 30, 2016

CLINICAL DATA: Shortness of Breath

EXAM:
CHEST  2 VIEW

[chest pa]
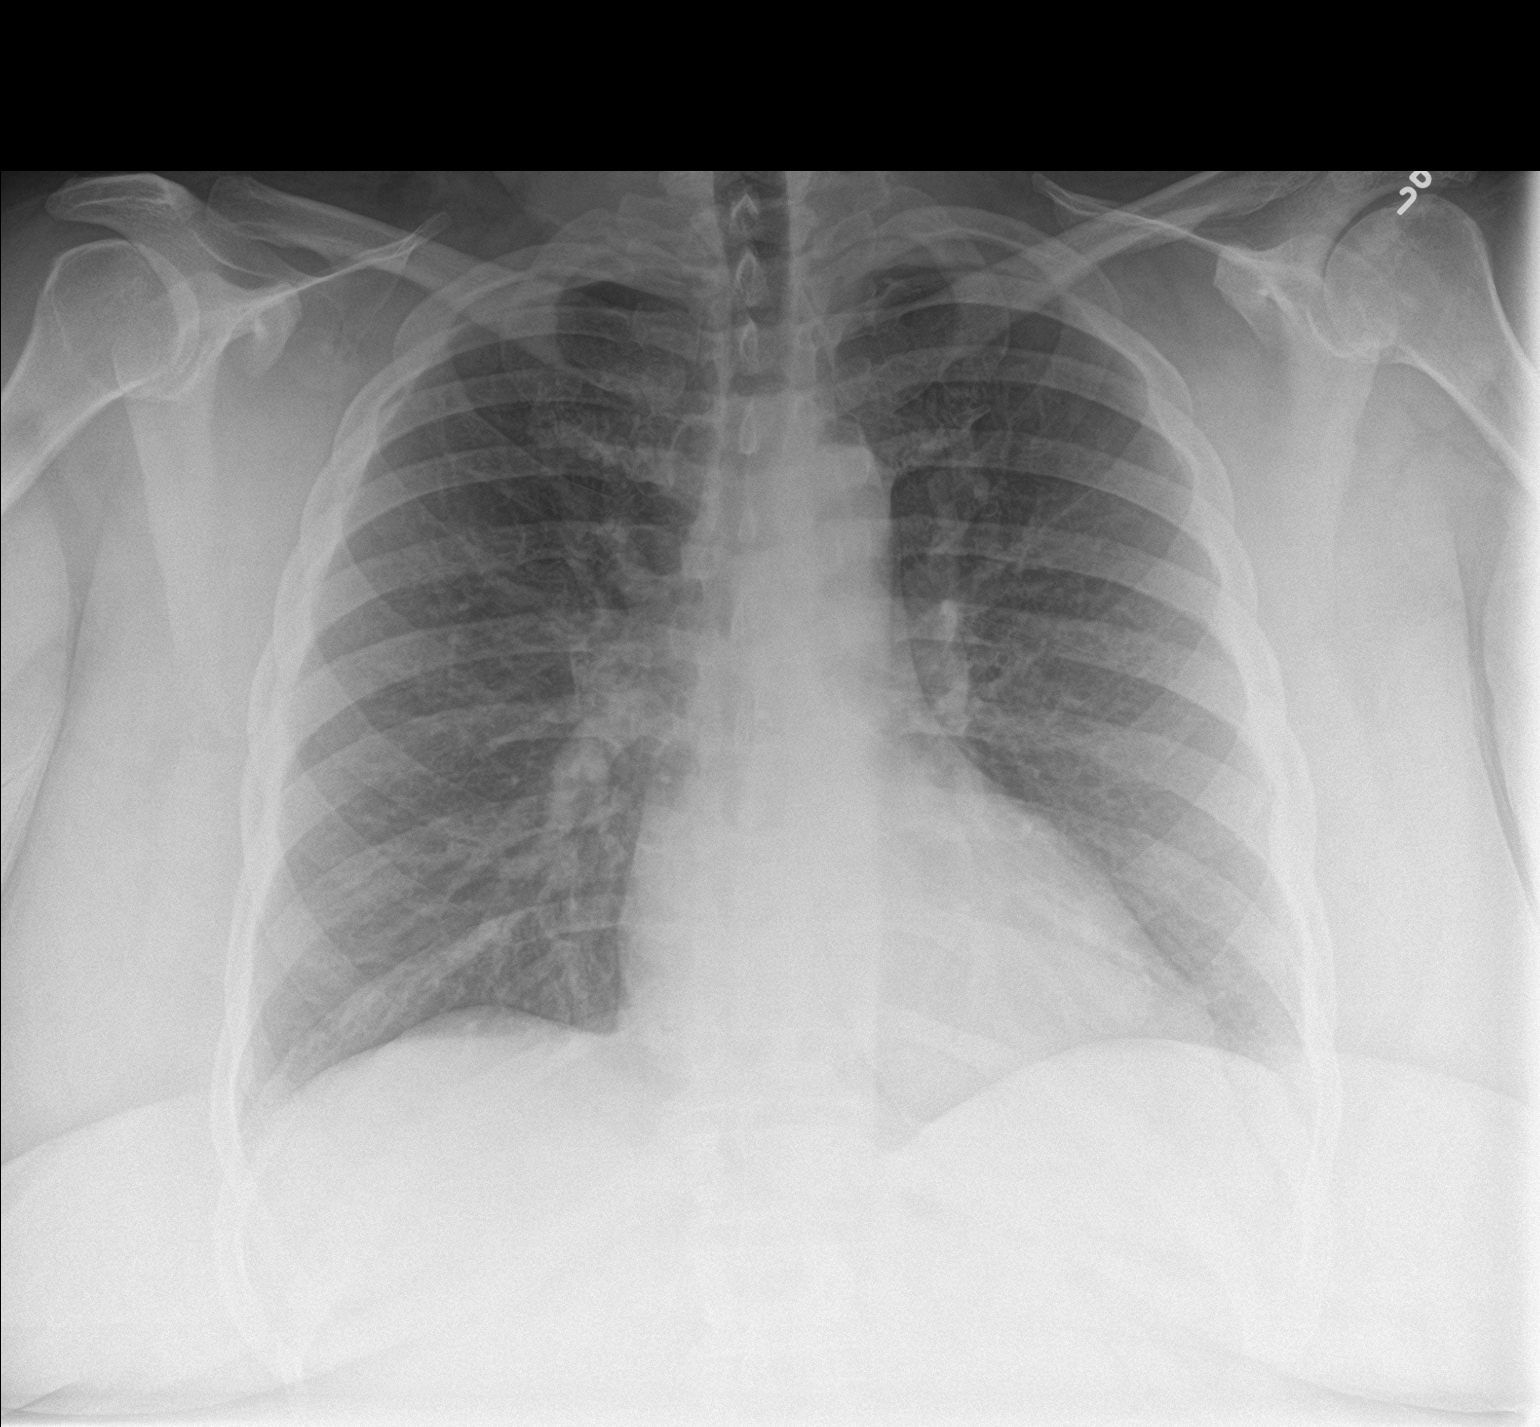

[chest lat]
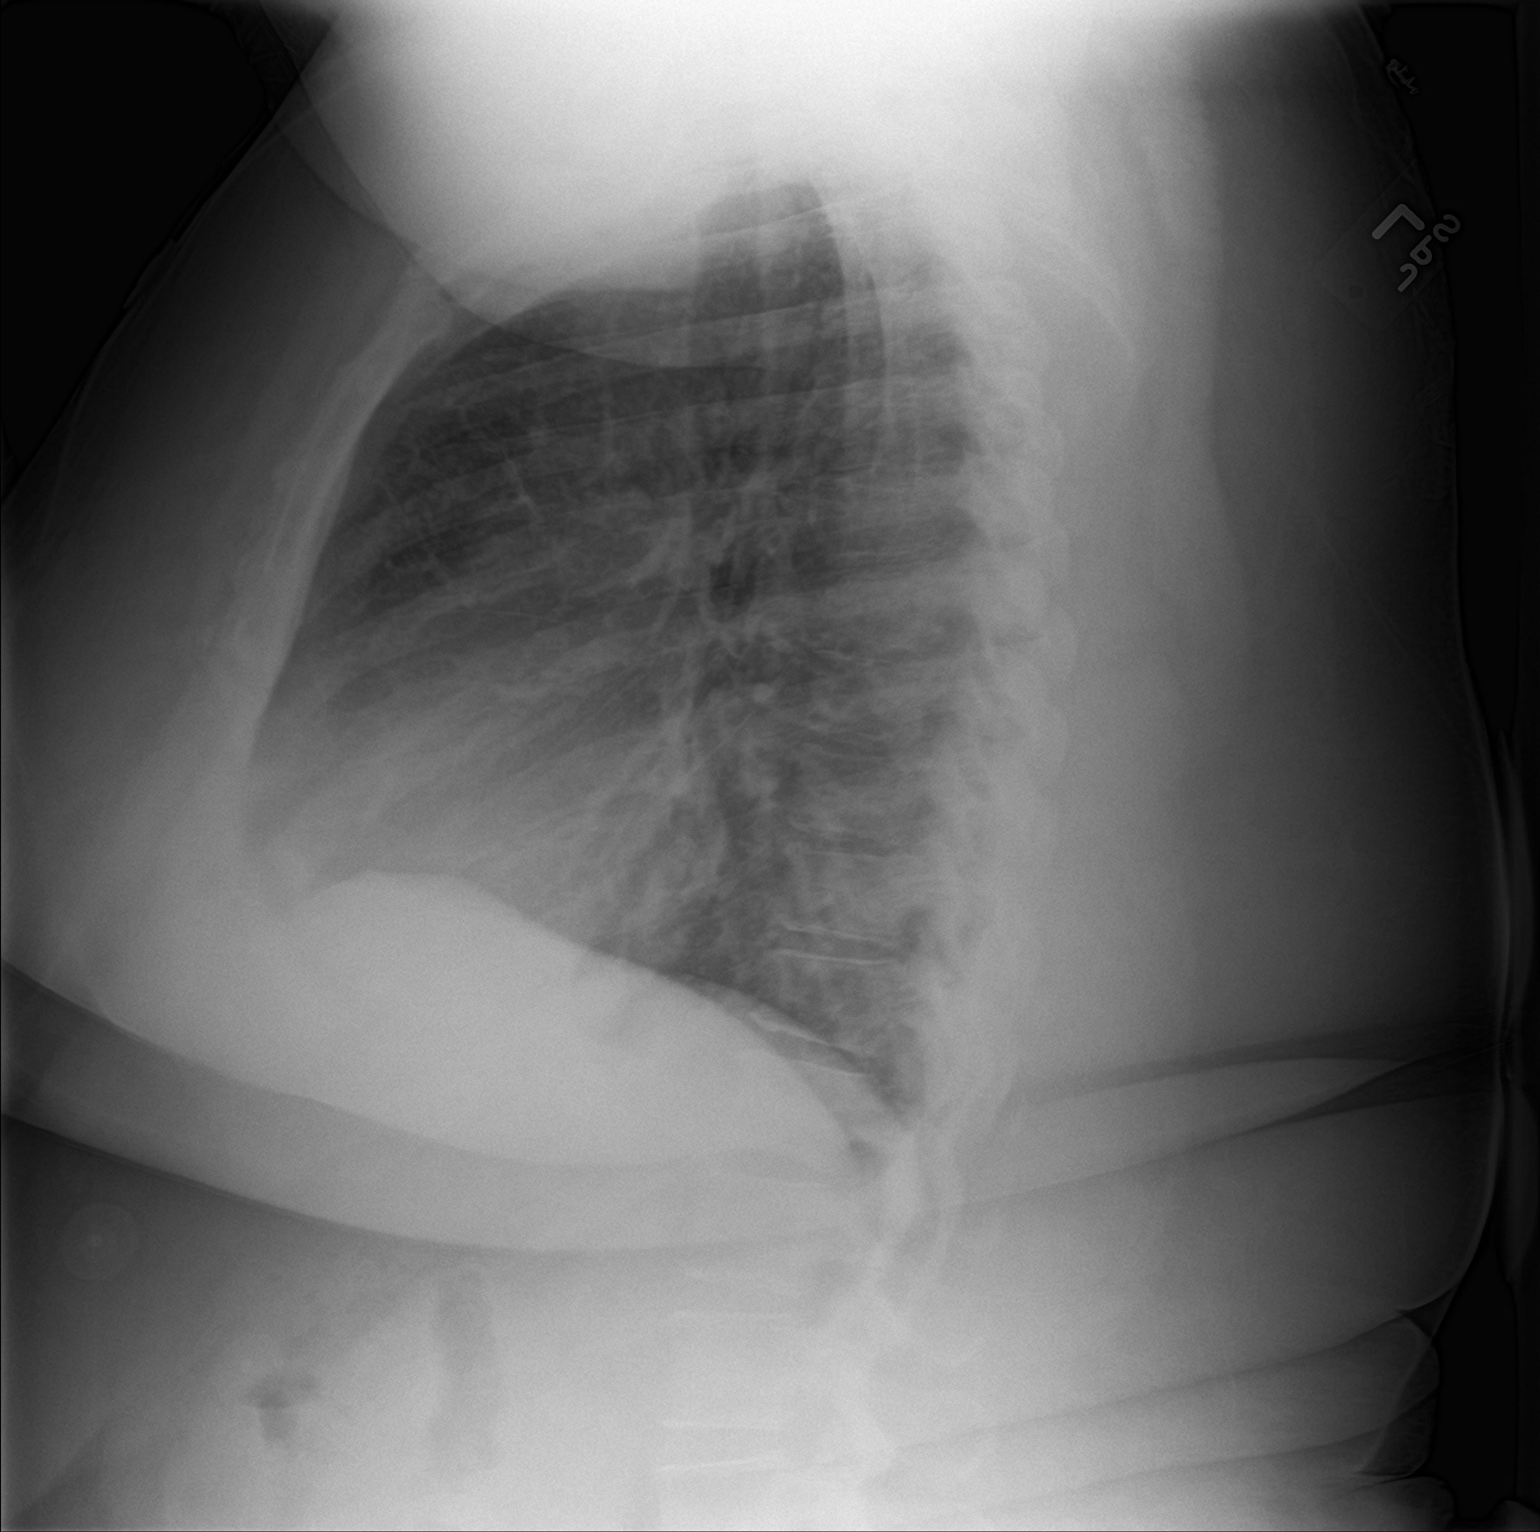

[2 of 2 positions shown; findings below may reference images not displayed]

FINDINGS: The lungs are clear. The heart size and pulmonary vascularity are
normal. No adenopathy. No pneumothorax. No bone lesions.
IMPRESSION: No abnormality noted.

## 2017-03-17 NOTE — Telephone Encounter (Signed)
Call placed to [facility] [phone] to check on the status of patient's referral for her bilateral knee brace. Spoke with Jeneen Rinks and he informed me that referral was received and representative contacted patient on 11/16. They were not able to speak with patient but left her a message to let her know about knee brace.

## 2017-03-18 ENCOUNTER — Telehealth: Payer: Self-pay | Admitting: Family Medicine

## 2017-03-18 NOTE — Telephone Encounter (Signed)
Call placed to patient [phone] to inquire about the status of her [facility] application. Spoke with patient and she informed me that [facility] called her while she was in the hospital and she told them that she would return the call but never did.   During conversation patient also mentioned that she hasn't eaten in a couple of days (almost a week) but has been drinking water and juice. Patient has also experienced some stomach pain. Informed patient that I would be notify PCP of this.

## 2017-03-19 ENCOUNTER — Telehealth: Payer: Self-pay | Admitting: Family Medicine

## 2017-03-19 NOTE — Telephone Encounter (Signed)
Patient returned my call. Informed patient that Dr. Jarold Song received my message and is aware of her current condition. Mentioned that PCP had advised patient to schedule an office visit if pain continues.   Patient stated that she late last night (gravy and liquids) but that the pain is still there. Scheduled an appointment for her next Thursday Nov.29 @10 :45. Advised patient that if pain got worst she should go to the ED. Patient understood and had no further questions.

## 2017-03-19 NOTE — Telephone Encounter (Signed)
Tried to contact patient # [phone] to inform her that PCP is aware of current condition and has recommended for patient to schedule an OV if symptom continues. No answer. Left patient a message to return my call at [phone].

## 2017-03-19 NOTE — Telephone Encounter (Signed)
If she continues to remain symptomatic she needs an OV.

## 2017-03-26 ENCOUNTER — Telehealth: Payer: Self-pay

## 2017-03-26 NOTE — Telephone Encounter (Signed)
Call placed to [facility] # [phone] regarding fax received for [facility] ICD 10 Transition Form.[name] Spoke to [facility] who stated that this form is not needed for this patient.  They do not need any additional information,

## 2017-03-27 ENCOUNTER — Ambulatory Visit: Payer: Medicaid Other | Admitting: Family Medicine

## 2017-03-29 ENCOUNTER — Emergency Department (HOSPITAL_COMMUNITY)
Admission: EM | Admit: 2017-03-29 | Discharge: 2017-03-29 | Disposition: A | Discharge status: Home / Self Care | Payer: Medicaid Other | Attending: Emergency Medicine | Admitting: Emergency Medicine

## 2017-03-29 ENCOUNTER — Other Ambulatory Visit: Payer: Self-pay

## 2017-03-29 ENCOUNTER — Emergency Department (HOSPITAL_COMMUNITY): Payer: Medicaid Other

## 2017-03-29 ENCOUNTER — Encounter (HOSPITAL_COMMUNITY): Payer: Self-pay | Admitting: *Deleted

## 2017-03-29 DIAGNOSIS — Z79899 Other long term (current) drug therapy: Secondary | ICD-10-CM | POA: Diagnosis not present

## 2017-03-29 DIAGNOSIS — I110 Hypertensive heart disease with heart failure: Secondary | ICD-10-CM | POA: Diagnosis not present

## 2017-03-29 DIAGNOSIS — J4521 Mild intermittent asthma with (acute) exacerbation: Secondary | ICD-10-CM | POA: Diagnosis not present

## 2017-03-29 DIAGNOSIS — I5032 Chronic diastolic (congestive) heart failure: Secondary | ICD-10-CM | POA: Insufficient documentation

## 2017-03-29 DIAGNOSIS — E876 Hypokalemia: Secondary | ICD-10-CM | POA: Diagnosis not present

## 2017-03-29 DIAGNOSIS — F17210 Nicotine dependence, cigarettes, uncomplicated: Secondary | ICD-10-CM | POA: Insufficient documentation

## 2017-03-29 DIAGNOSIS — J449 Chronic obstructive pulmonary disease, unspecified: Secondary | ICD-10-CM | POA: Insufficient documentation

## 2017-03-29 DIAGNOSIS — R1084 Generalized abdominal pain: Secondary | ICD-10-CM

## 2017-03-29 LAB — COMPREHENSIVE METABOLIC PANEL
ALBUMIN: 2.3 g/dL — AB (ref 3.5–5.0)
ALT: 13 U/L — AB (ref 14–54)
AST: 24 U/L (ref 15–41)
Alkaline Phosphatase: 63 U/L (ref 38–126)
Anion gap: 10 (ref 5–15)
CHLORIDE: 98 mmol/L — AB (ref 101–111)
CO2: 23 mmol/L (ref 22–32)
CREATININE: 1.36 mg/dL — AB (ref 0.44–1.00)
Calcium: 7.4 mg/dL — ABNORMAL LOW (ref 8.9–10.3)
GFR calc Af Amer: 54 mL/min — ABNORMAL LOW (ref >60)
GFR calc non Af Amer: 47 mL/min — ABNORMAL LOW (ref >60)
Glucose, Bld: 108 mg/dL — ABNORMAL HIGH (ref 65–99)
POTASSIUM: 2.7 mmol/L — AB (ref 3.5–5.1)
SODIUM: 131 mmol/L — AB (ref 135–145)
Total Bilirubin: 0.3 mg/dL (ref 0.3–1.2)
Total Protein: 5.5 g/dL — ABNORMAL LOW (ref 6.5–8.1)

## 2017-03-29 LAB — URINALYSIS, ROUTINE W REFLEX MICROSCOPIC
Glucose, UA: NEGATIVE mg/dL
HGB URINE DIPSTICK: NEGATIVE
Ketones, ur: NEGATIVE mg/dL
LEUKOCYTES UA: NEGATIVE
Nitrite: NEGATIVE
PROTEIN: 30 mg/dL — AB
SPECIFIC GRAVITY, URINE: 1.02 (ref 1.005–1.030)
pH: 5 (ref 5.0–8.0)

## 2017-03-29 LAB — CBC
HEMATOCRIT: 40 % (ref 36.0–46.0)
Hemoglobin: 12.7 g/dL (ref 12.0–15.0)
MCH: 27.7 pg (ref 26.0–34.0)
MCHC: 31.8 g/dL (ref 30.0–36.0)
MCV: 87.3 fL (ref 78.0–100.0)
PLATELETS: 412 10*3/uL — AB (ref 150–400)
RBC: 4.58 MIL/uL (ref 3.87–5.11)
RDW: 15.7 % — AB (ref 11.5–15.5)
WBC: 15.5 10*3/uL — ABNORMAL HIGH (ref 4.0–10.5)

## 2017-03-29 LAB — I-STAT BETA HCG BLOOD, ED (MC, WL, AP ONLY)

## 2017-03-29 LAB — LIPASE, BLOOD: LIPASE: 20 U/L (ref 11–51)

## 2017-03-29 IMAGING — CR DG KNEE COMPLETE 4+V*L*
4 series · 4 of 4 positions shown · non-contrast
Comparison: Radiographs September 14, 2015.

CLINICAL DATA: Left knee pain after recent fall.

EXAM:
LEFT KNEE - COMPLETE 4+ VIEW

[t knee ap left]
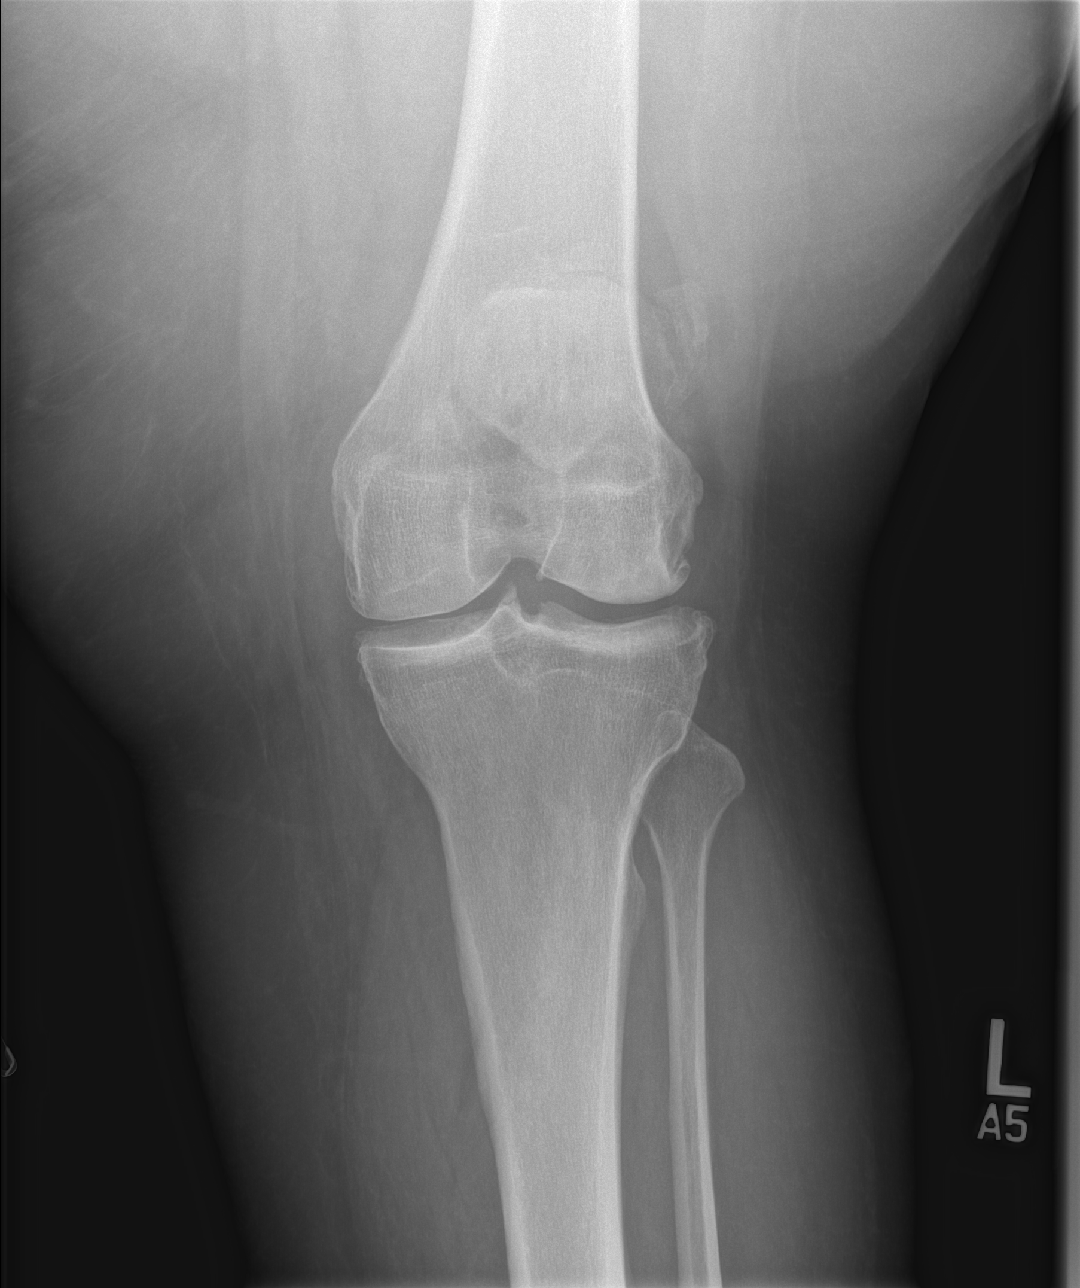

[t knee obl left (1 of 2)]
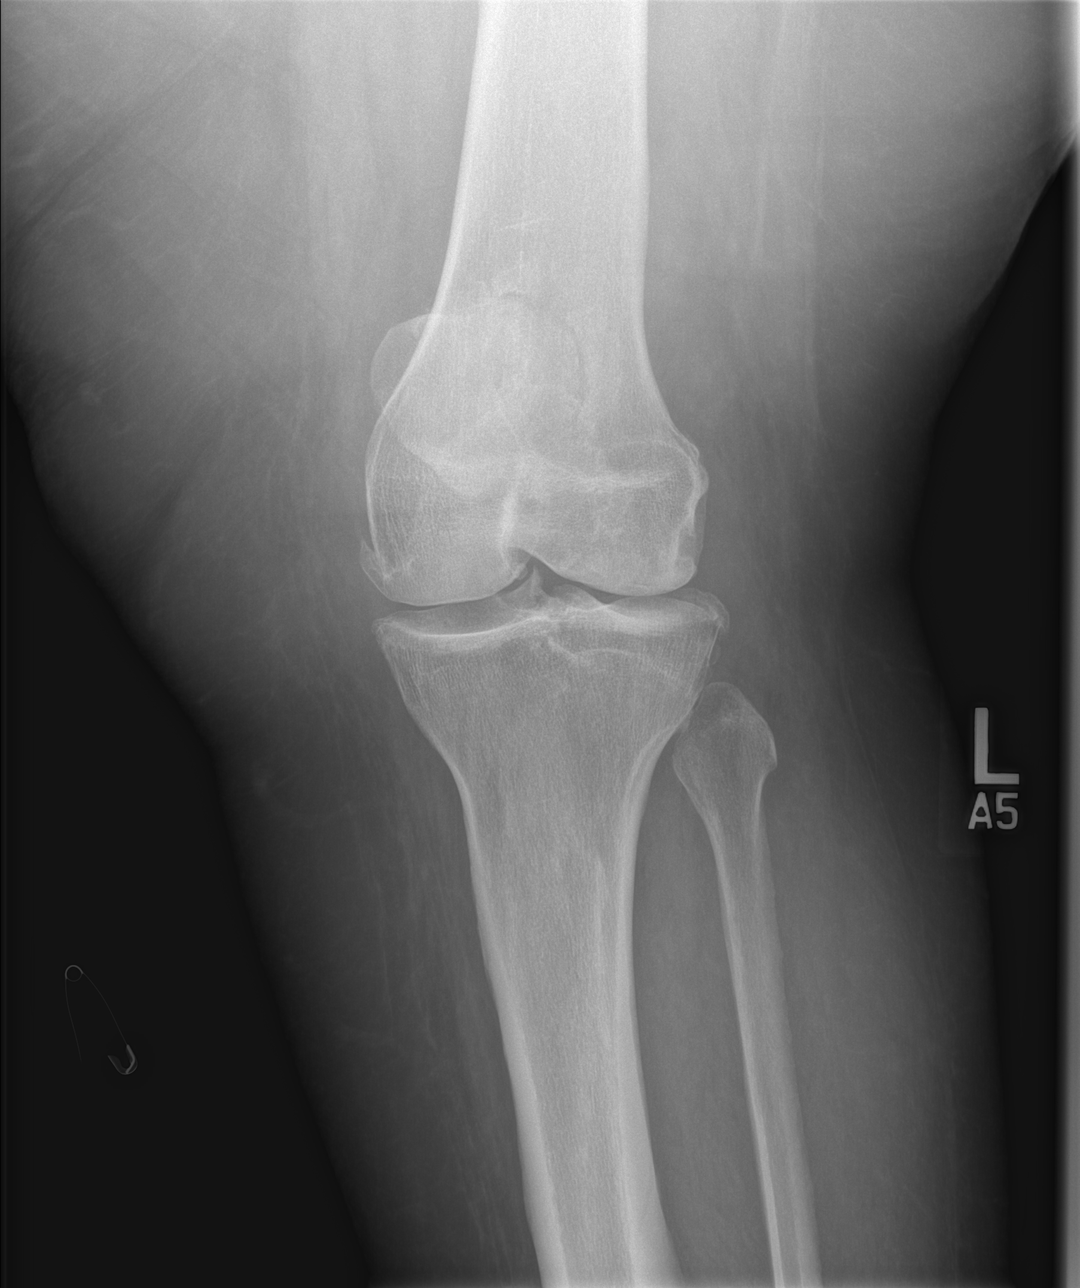

[t knee obl left (2 of 2)]
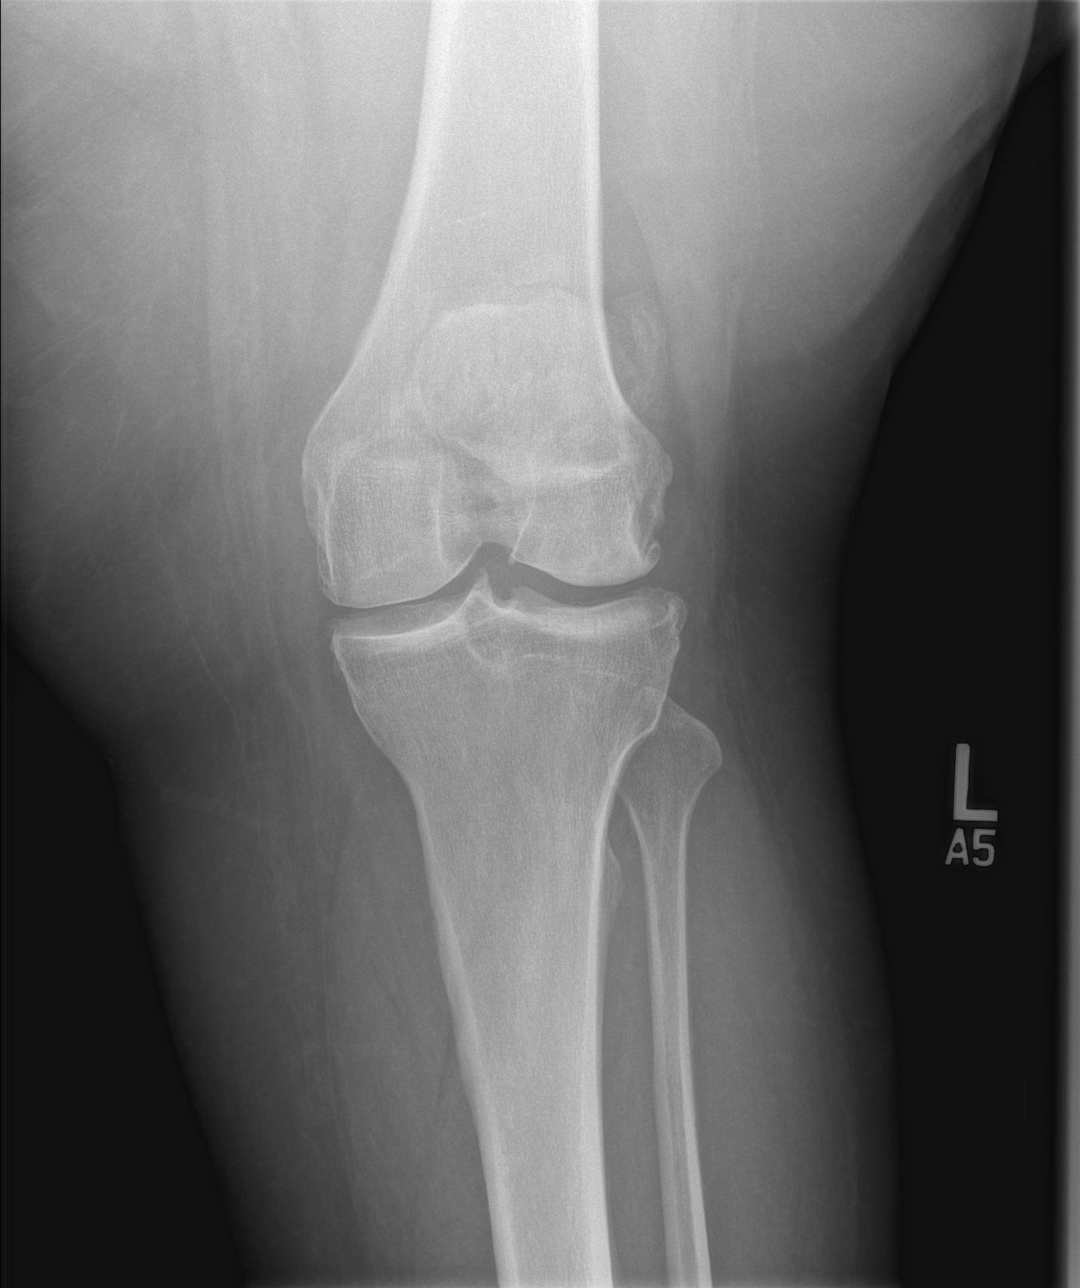

[t knee lat left]
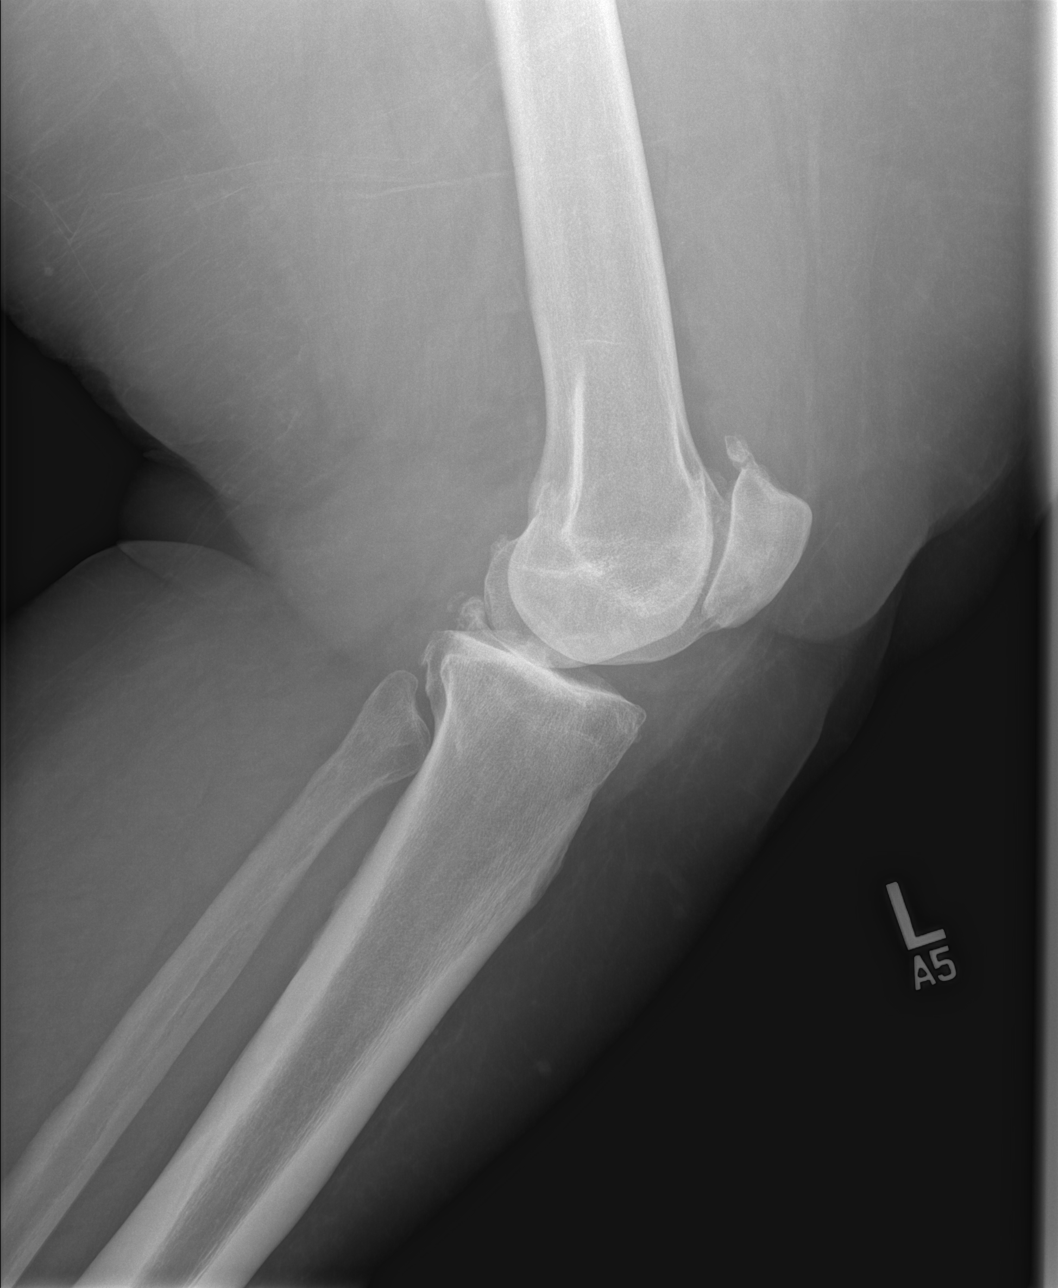

[4 of 4 positions shown; findings below may reference images not displayed]

FINDINGS: No evidence of fracture, dislocation, or joint effusion. Mild
narrowing of patellofemoral space is noted with spurring of superior
aspect of the patella. Mild osteophyte formation is noted medially
and laterally. Soft tissues are unremarkable.
IMPRESSION: Mild degenerative joint disease. No acute abnormality seen the left
knee.

## 2017-03-29 MED ORDER — MOMETASONE FURO-FORMOTEROL FUM 200-5 MCG/ACT IN AERO: 2.0000 | INHALATION_SPRAY | Freq: Two times a day (BID) | RESPIRATORY_TRACT | 0 refills | Status: DC

## 2017-03-29 MED ORDER — ONDANSETRON 4 MG PO TBDP: 4.0000 mg | ORAL_TABLET | Freq: Three times a day (TID) | ORAL | 0 refills | Status: DC | PRN

## 2017-03-29 MED ORDER — POTASSIUM CHLORIDE CRYS ER 20 MEQ PO TBCR
60.0000 meq | EXTENDED_RELEASE_TABLET | Freq: Once | ORAL | Status: AC
  Administered 2017-03-29: 60 meq via ORAL
  Filled 2017-03-29: qty 3

## 2017-03-29 MED ORDER — IOPAMIDOL (ISOVUE-300) INJECTION 61%
INTRAVENOUS | Status: AC
  Administered 2017-03-29: 100 mL via INTRAVENOUS
  Filled 2017-03-29: qty 100

## 2017-03-29 MED ORDER — METHYLPREDNISOLONE SODIUM SUCC 125 MG IJ SOLR
125.0000 mg | Freq: Once | INTRAMUSCULAR | Status: AC
  Administered 2017-03-29: 125 mg via INTRAVENOUS
  Filled 2017-03-29: qty 2

## 2017-03-29 MED ORDER — IPRATROPIUM-ALBUTEROL 0.5-2.5 (3) MG/3ML IN SOLN
3.0000 mL | Freq: Once | RESPIRATORY_TRACT | Status: AC
  Administered 2017-03-29: 3 mL via RESPIRATORY_TRACT
  Filled 2017-03-29: qty 3

## 2017-03-29 MED ORDER — FAMOTIDINE 20 MG PO TABS: 20.0000 mg | ORAL_TABLET | Freq: Two times a day (BID) | ORAL | 0 refills | Status: DC

## 2017-03-29 MED ORDER — SODIUM CHLORIDE 0.9 % IV BOLUS (SEPSIS)
1000.0000 mL | Freq: Once | INTRAVENOUS | Status: AC
  Administered 2017-03-29: 1000 mL via INTRAVENOUS

## 2017-03-29 MED ORDER — PREDNISONE 20 MG PO TABS: 40.0000 mg | ORAL_TABLET | Freq: Every day | ORAL | 0 refills | Status: DC

## 2017-03-29 MED ORDER — POTASSIUM CHLORIDE ER 10 MEQ PO TBCR: 10.0000 meq | EXTENDED_RELEASE_TABLET | Freq: Every day | ORAL | 0 refills | Status: DC

## 2017-03-29 NOTE — ED Triage Notes (Signed)
The pt is c/o abd pain for one week with nv and she has asthma and has had some difficulty with that also no wheezes  No resp distress  lmp unknown

## 2017-03-29 NOTE — ED Provider Notes (Signed)
[facility] EMERGENCY DEPARTMENT Provider Note   CSN: [id number] Arrival date & time: 03/29/17  1654     History   Chief Complaint Chief Complaint  Patient presents with  . Abdominal Pain    HPI April [name] is a 46 y.o. female.  The history is provided by the patient and medical records. No language interpreter was used.  Abdominal Pain   Associated symptoms include nausea and vomiting. Pertinent negatives include diarrhea and constipation.   April [name] is a 46 y.o. female  with a PMH of COPD, GERD, HTN, CHF who presents to the Emergency Department complaining of generalized abdominal pain x 1 weeks associated with nausea and vomiting. Patient states that she has thrown up about twice a day for the last week, often when trying to eat or drink something. No alleviating factors noted. Taken OTC pain medication with little relief. Abdominal pain will come and go, but denies triggers. Currently feels nauseous, but not experiencing any pain. She has hx of asthma and has also noted wheezing over the last few days. Denies shortness of breath. Has been taking her home inhalers with no improvement. No chest pain or congestion. No fever, chills. No diarrhea, constipation, blood in stool, dysuria, vaginal discharge, back pain.   Past Medical History:  Diagnosis Date  . Acanthosis nigricans   . Arthritis   . Asthma    Followed by Dr. Annamaria Boots (pulmonology); receives every other week omalizumab injections; has frequent exacerbations  . COPD (chronic obstructive pulmonary disease) ([place])    PFTs in 2002, FEV1/FVC 65, no post bronchodilater test done  . Depression   . GERD (gastroesophageal reflux disease)   . GERD (gastroesophageal reflux disease)   . Headache(784.0)   . Helicobacter pylori (H. pylori) infection   . Hypertension, essential   . Insomnia   . Menorrhagia   . Morbid obesity ([place])   . Obesity   . Seasonal allergies   . Shortness of breath   . Sleep apnea      Sleep study 2008 - mild OSA, not enough events to titrate CPAP  . Tobacco user     Patient Active Problem List   Diagnosis Date Noted  . Acute asthma exacerbation 02/15/2017  . Acute respiratory failure with hypoxia ([place]) 02/10/2017  . Dyspnea 02/04/2017  . Asthma 02/04/2017  . Hypertension, essential 02/04/2017  . Sleep apnea 02/04/2017  . Morbid obesity with BMI of 50.0-59.9, adult ([place]) 02/04/2017  . Chronic diastolic heart failure, NYHA class 1 ([place]) 02/04/2017  . Acute hyperglycemia 02/04/2017  . Chronic diastolic CHF (congestive heart failure) ([place]) 01/17/2017  . SIRS (systemic inflammatory response syndrome) ([place]) 01/17/2017  . Depression 01/17/2017  . OSA (obstructive sleep apnea) 01/17/2017  . Asthma exacerbation 01/17/2017  . SOB (shortness of breath)   . Asthma exacerbation in COPD ([place]) 12/10/2016  . Moderate persistent asthma with exacerbation 10/19/2016  . Diarrhea 10/19/2016  . Normocytic anemia 10/02/2016  . Respiratory failure ([place]) 10/02/2016  . Hypertensive urgency, malignant 06/22/2016  . Pulmonary edema 06/22/2016  . Hypertensive urgency 06/22/2016  . Malignant hypertension due to primary aldosteronism ([place]) 05/05/2016  . Lip laceration 05/05/2016  . Elevated hemoglobin A1c 05/05/2016  . GERD (gastroesophageal reflux disease) 08/30/2015  . Anxiety and depression 08/30/2015  . Prediabetes 08/22/2015  . Seasonal allergic rhinitis 08/29/2013  . Leukocytosis 10/21/2012  . Tobacco abuse 10/07/2012  . Asthma, chronic obstructive, without status asthmaticus ([place]) 05/07/2012  . Knee pain, bilateral 04/25/2011  . Primary  insomnia 03/14/2011  . Mild obstructive sleep apnea 12/19/2010  . Hypokalemia 08/13/2010  . Cervical back pain with evidence of disc disease 04/08/2008  . Essential hypertension 07/31/2006  . Morbid obesity with body mass index of 50.0-59.9 in adult ([place]) 06/17/2006  . Major depressive disorder, recurrent episode ([place]) 04/10/2006     Past Surgical History:  Procedure Laterality Date  . BREAST REDUCTION SURGERY  09/2011  . TUBAL LIGATION  1996   bilateral    OB History    No data available       Home Medications    Prior to Admission medications   Medication Sig Start Date End Date Taking? Authorizing Provider  albuterol (PROVENTIL HFA;VENTOLIN HFA) 108 (90 Base) MCG/ACT inhaler Inhale 2 puffs every 4 (four) hours as needed into the lungs for wheezing or shortness of breath. 03/12/17  Yes Arnoldo Morale, MD  amLODipine (NORVASC) 10 MG tablet Take 1 tablet (10 mg total) daily by mouth. 03/12/17  Yes Amao, Enobong, MD  budesonide (PULMICORT) 0.25 MG/2ML nebulizer solution Take 2 mLs (0.25 mg total) 2 (two) times daily by nebulization. 03/12/17  Yes Arnoldo Morale, MD  busPIRone (BUSPAR) 7.5 MG tablet Take 1 tablet (7.5 mg total) 2 (two) times daily by mouth. 03/12/17  Yes Arnoldo Morale, MD  butalbital-acetaminophen-caffeine (FIORICET, ESGIC) 50-325-40 MG tablet Take 1-2 tablets by mouth every 6 (six) hours as needed for headache. 12/19/16 12/19/17 Yes Joy, Shawn C, PA-C  diclofenac (VOLTAREN) 75 MG EC tablet Take 1 tablet (75 mg total) 2 (two) times daily by mouth. 03/12/17  Yes Amao, Enobong, MD  fluticasone (FLONASE) 50 MCG/ACT nasal spray Place 2 sprays into both nostrils daily. 12/13/16  Yes Rai, Ripudeep K, MD  furosemide (LASIX) 40 MG tablet Take 1 tablet (40 mg total) daily by mouth. 03/12/17 03/12/18 Yes Amao, Enobong, MD  guaiFENesin (MUCINEX) 600 MG 12 hr tablet Take 1 tablet (600 mg total) by mouth 2 (two) times daily. 12/13/16  Yes Rai, Ripudeep K, MD  loratadine (CLARITIN) 10 MG tablet Take 1 tablet (10 mg total) by mouth daily. 08/30/15  Yes [name], MD  losartan (COZAAR) 100 MG tablet Take 1 tablet (100 mg total) daily by mouth. 03/12/17  Yes Amao, Enobong, MD  meclizine (ANTIVERT) 25 MG tablet Take 25 mg by mouth as needed. 01/15/17  Yes [provider]  metoprolol tartrate (LOPRESSOR) 100 MG  tablet Take 1 tablet (100 mg total) by mouth 2 (two) times daily. 02/05/17  Yes Arnoldo Morale, MD  montelukast (SINGULAIR) 10 MG tablet Take 1 tablet (10 mg total) at bedtime by mouth. 03/12/17  Yes Amao, Enobong, MD  nicotine (NICODERM CQ - DOSED IN MG/24 HOURS) 14 mg/24hr patch Place 1 patch (14 mg total) onto the skin daily. Patient taking differently: Place 14 mg onto the skin daily as needed (smoking cessation).  10/04/16  Yes Reyne Dumas, MD  omeprazole (PRILOSEC) 20 MG capsule Take 1 capsule (20 mg total) by mouth daily. Patient taking differently: Take 20 mg by mouth 2 (two) times daily.  10/09/16  Yes Baird Lyons D, MD  sertraline (ZOLOFT) 100 MG tablet Take 1 tablet (100 mg total) daily by mouth. 03/12/17  Yes Amao, Charlane Ferretti, MD  spironolactone (ALDACTONE) 100 MG tablet Take 1 tablet (100 mg total) daily by mouth. 03/12/17  Yes Amao, Enobong, MD  zolpidem (AMBIEN) 10 MG tablet Take 10 mg by mouth at bedtime. 09/27/10  Yes [provider]  arformoterol (BROVANA) 15 MCG/2ML NEBU Take 2 mLs (15 mcg  total) 2 (two) times daily by nebulization. Patient not taking: Reported on 03/29/2017 03/12/17   Arnoldo Morale, MD  famotidine (PEPCID) 20 MG tablet Take 1 tablet (20 mg total) by mouth 2 (two) times daily. 03/29/17   Amberlee Garvey, Ozella Almond, PA-C  mometasone-formoterol (DULERA) 200-5 MCG/ACT AERO Inhale 2 puffs into the lungs 2 (two) times daily. 03/29/17   Anihya Tuma, Ozella Almond, PA-C  ondansetron (ZOFRAN ODT) 4 MG disintegrating tablet Take 1 tablet (4 mg total) by mouth every 8 (eight) hours as needed for nausea or vomiting. 03/29/17   Harinder Romas, Ozella Almond, PA-C  potassium chloride (K-DUR) 10 MEQ tablet Take 1 tablet (10 mEq total) by mouth daily for 7 days. 03/29/17 04/05/17  Michaeljoseph Revolorio, Ozella Almond, PA-C  predniSONE (DELTASONE) 20 MG tablet Take 2 tablets (40 mg total) by mouth daily. 03/29/17   Dejanae Helser, Ozella Almond, PA-C    Family History Family History  Problem Relation Age of Onset  .  Hypertension Mother   . Asthma Daughter   . Cancer Paternal Aunt   . Asthma Maternal Grandmother     Social History Social History   Tobacco Use  . Smoking status: Former Smoker    Packs/day: 0.50    Years: 18.00    Pack years: 9.00    Types: Cigarettes    Last attempt to quit: 04/29/2014    Years since quitting: 2.9  . Smokeless tobacco: Never Used  Substance Use Topics  . Alcohol use: No    Alcohol/week: 0.0 oz  . Drug use: No     Allergies   Patient has no known allergies.   Review of Systems Review of Systems  Respiratory: Positive for wheezing. Negative for cough and shortness of breath.   Gastrointestinal: Positive for abdominal pain, nausea and vomiting. Negative for blood in stool, constipation and diarrhea.  All other systems reviewed and are negative.    Physical Exam Updated Vital Signs BP 118/66 (BP Location: Right Arm)   Pulse 88   Resp 15   Ht 5\' 6"  (1.676 m)   Wt (!) 158.8 kg (350 lb)   LMP 03/29/2017 (LMP Unknown)   SpO2 100%   BMI 56.49 kg/m   Physical Exam  Constitutional: She is oriented to person, place, and time. She appears well-developed and well-nourished. No distress.  Non-toxic appearing.  HENT:  Head: Normocephalic and atraumatic.  Cardiovascular: Normal rate, regular rhythm and normal heart sounds.  No murmur heard. Pulmonary/Chest: Effort normal. No respiratory distress. She has wheezes. She has no rales. She exhibits no tenderness.  Abdominal: Soft. Bowel sounds are normal. She exhibits no distension.  No abdominal tenderness.  Musculoskeletal: She exhibits no edema.  Neurological: She is alert and oriented to person, place, and time.  Skin: Skin is warm and dry.  Nursing note and vitals reviewed.    ED Treatments / Results  Labs (all labs ordered are listed, but only abnormal results are displayed) Labs Reviewed  COMPREHENSIVE METABOLIC PANEL - Abnormal; Notable for the following components:      Result Value    Sodium 131 (*)    Potassium 2.7 (*)    Chloride 98 (*)    Glucose, Bld 108 (*)    BUN <5 (*)    Creatinine, Ser 1.36 (*)    Calcium 7.4 (*)    Total Protein 5.5 (*)    Albumin 2.3 (*)    ALT 13 (*)    GFR calc non Af Amer 47 (*)    GFR calc Af  Amer 54 (*)    All other components within normal limits  CBC - Abnormal; Notable for the following components:   WBC 15.5 (*)    RDW 15.7 (*)    Platelets 412 (*)    All other components within normal limits  URINALYSIS, ROUTINE W REFLEX MICROSCOPIC - Abnormal; Notable for the following components:   Color, Urine AMBER (*)    APPearance CLOUDY (*)    Bilirubin Urine MODERATE (*)    Protein, ur 30 (*)    Bacteria, UA RARE (*)    Squamous Epithelial / LPF TOO NUMEROUS TO COUNT (*)    All other components within normal limits  LIPASE, BLOOD  I-STAT BETA HCG BLOOD, ED ([facility], [facility], [facility] ONLY)    EKG  EKG Interpretation  Date/Time:  Saturday March 29 2017 16:58:12 EST Ventricular Rate:  98 PR Interval:  130 QRS Duration: 86 QT Interval:  388 QTC Calculation: 495 R Axis:   48 Text Interpretation:  Normal sinus rhythm Possible Anterior infarct , age undetermined Abnormal ECG no acute change from previous Confirmed by Charlesetta Shanks [phone]) on 03/29/2017 11:54:02 PM       Radiology Ct Abdomen Pelvis W Contrast  Result Date: 03/29/2017 CLINICAL DATA:  46 year old female with abdominal and pelvic pain for 1 week. EXAM: CT ABDOMEN AND PELVIS WITH CONTRAST TECHNIQUE: Multidetector CT imaging of the abdomen and pelvis was performed using the standard protocol following bolus administration of intravenous contrast. CONTRAST:  174mL ISOVUE-300 IOPAMIDOL (ISOVUE-300) INJECTION 61% COMPARISON:  10/27/2016 and prior CTs FINDINGS: Lower chest: No acute abnormality Hepatobiliary: The liver and gallbladder are unremarkable. No biliary dilatation. Pancreas: Unremarkable Spleen: Unremarkable Adrenals/Urinary Tract: The kidneys, adrenal glands and bladder  are unremarkable. Stomach/Bowel: Equivocal stranding adjacent to the sigmoid colon is noted (images 60-64) but may be secondary to streak artifact in this area. Stomach is within normal limits. Appendix appears normal. No evidence of bowel wall thickening, distention, or other inflammatory changes. Vascular/Lymphatic: No significant vascular findings are present. No enlarged abdominal or pelvic lymph nodes. Reproductive: Uterus and bilateral adnexa are unremarkable. Other: No abdominal wall hernia or abnormality. No abdominopelvic ascites. Musculoskeletal: No acute or significant osseous findings. IMPRESSION: 1. Equivocal stranding adjacent to the sigmoid colon which could be related to mild diverticulitis versus streak artifact. No evidence of bowel obstruction, ascites, abscess or pneumoperitoneum. 2. No other acute abnormalities identified. Electronically Signed   By: Margarette Canada M.D.   On: 03/29/2017 22:16   Dg Abdomen Acute W/chest  Result Date: 03/29/2017 CLINICAL DATA:  Abdominal pain for 1 week EXAM: DG ABDOMEN ACUTE W/ 1V CHEST COMPARISON:  02/14/2017, CT 10/27/2016 FINDINGS: Single-view chest demonstrates no consolidation or effusion. Normal heart size. No pneumothorax. Supine and upright views of the abdomen demonstrate no free air. Nonobstructed gas pattern IMPRESSION: Negative abdominal radiographs.  No acute cardiopulmonary disease. Electronically Signed   By: Donavan Foil M.D.   On: 03/29/2017 20:09    Procedures Procedures (including critical care time)  Medications Ordered in ED Medications  ipratropium-albuterol (DUONEB) 0.5-2.5 (3) MG/3ML nebulizer solution 3 mL (3 mLs Nebulization Given 03/29/17 2007)  sodium chloride 0.9 % bolus 1,000 mL (0 mLs Intravenous Stopped 03/29/17 2317)  potassium chloride SA (K-DUR,KLOR-CON) CR tablet 60 mEq (60 mEq Oral Given 03/29/17 2025)  ipratropium-albuterol (DUONEB) 0.5-2.5 (3) MG/3ML nebulizer solution 3 mL (3 mLs Nebulization Given 03/29/17 2117)   methylPREDNISolone sodium succinate (SOLU-MEDROL) 125 mg/2 mL injection 125 mg (125 mg Intravenous Given 03/29/17 2122)  iopamidol (ISOVUE-300)  61 % injection (100 mLs Intravenous Contrast Given 03/29/17 2146)  ipratropium-albuterol (DUONEB) 0.5-2.5 (3) MG/3ML nebulizer solution 3 mL (3 mLs Nebulization Given 03/29/17 2308)     Initial Impression / Assessment and Plan / ED Course  I have reviewed the triage vital signs and the nursing notes.  Pertinent labs & imaging results that were available during my care of the patient were reviewed by me and considered in my medical decision making (see chart for details).    April [name] is a 46 y.o. female who presents to ED for generalized abdominal pain over the last week. Associated with nausea and approx. 2 episodes of vomiting daily over the last week. Additionally, she reports wheezing and feels like her asthma is acting up. On exam, patient is afebrile, hemodynamically stable with benign abdominal exam. She reports feeling nauseous currently, but no abdominal pain at present. She is wheezing, however O2 is 100% on RA, no increased effort in breathing and does not feel short of breath. Breathing feels improved after neb treatment and steroids. CXR negative. Labs reviewed with multiple abnormalities. CBC with leukocytosis of 15.5. AKI with creatinine of 1.36. Multiple elyte abnormalities including sodium of 131, chloride of 98 and potassium of 2.7. K+ replenished in ED today and will give short course of PO for home. Given multiple lab abnormalities with abdominal pain and nausea, will obtain CT for further evaluation.   CT obtained and reassuring. Equivocal stranding adjacent to the sigmoid colon noted. Mild diverticulitis vs. Streak artifact. No tenderness on exam. Doubt diverticulitis, favor streak artifact.   A&P:  Abdominal pain associated with nausea: CT reassuring. Will start on Pepcid and PRN zofran.  Hypokalemia / eltye abnormalities:  Hydrated in ED.  Start on home potassium x 1 week. PCP follow up for recheck next week.   Asthma exacerbation: Refilled inhaler, prednisone burst.   Reasons to return to ER discussed at length. Patient understands importance of PCP follow up. All questions answered.   Patient discussed with Dr. Johnney Killian who agrees with treatment plan.   Final Clinical Impressions(s) / ED Diagnoses   Final diagnoses:  Hypokalemia  Generalized abdominal pain  Mild intermittent asthma with acute exacerbation in adult    ED Discharge Orders        Ordered    potassium chloride (K-DUR) 10 MEQ tablet  Daily     03/29/17 2332    mometasone-formoterol (DULERA) 200-5 MCG/ACT AERO  2 times daily     03/29/17 2332    predniSONE (DELTASONE) 20 MG tablet  Daily     03/29/17 2332    ondansetron (ZOFRAN ODT) 4 MG disintegrating tablet  Every 8 hours PRN     03/29/17 2332    famotidine (PEPCID) 20 MG tablet  2 times daily     03/29/17 2332       Moon Budde, Ozella Almond, PA-C 03/29/17 2355    Charlesetta Shanks, MD 03/30/17 1904

## 2017-03-29 NOTE — ED Triage Notes (Signed)
CRITICAL VALUE ALERT  Critical Value:  Potassium 2.6  Date & Time Notied:  03/29/2017  Provider Notified: Dr. Lita Mains

## 2017-03-29 NOTE — ED Notes (Signed)
Patient transported to X-ray 

## 2017-03-29 NOTE — Discharge Instructions (Signed)
It was my pleasure taking care of you today!   I have refilled your inhaler for you. Continue to use your home inhalers as directed. Take prednisone daily beginning tomorrow morning. Take potassium daily x 1 week beginning tomorrow morning. Zofran as needed for nausea. Pepcid daily for abdominal pain.   It is very important that you follow up with your primary care provider, preferably next week. You will need lab work done to check your potassium and other electrolytes.   At this time, there does not appear to be an acute, emergent cause for your abdominal pain. However, this does not mean that your abdominal pain may not become an emergency in the future. It is VERY important that you monitor your symptoms and return to the Emergency Department if you develop any of the following symptoms:  You have a fever.  You keep throwing up and can't keep fluids down. You pass bloody or black tarry stools.  There is bright red blood in the stool. You do not seem to be getting better.  You have any questions or concerns.

## 2017-04-01 NOTE — Telephone Encounter (Signed)
I received a fax that pt's SMN is getting ready to exp.. I tried to call her again and vm had not been set up. I pulled demographics to see if there was another # I could call: Mother: [phone]. Line was busy. I will try again later to see if her mother will give her a message.

## 2017-04-02 ENCOUNTER — Telehealth: Payer: Self-pay | Admitting: Internal Medicine

## 2017-04-02 NOTE — Telephone Encounter (Signed)
Will forward to TS to f/u on.

## 2017-04-02 NOTE — Telephone Encounter (Signed)
For [employer]

## 2017-04-02 NOTE — Telephone Encounter (Signed)
I have the forms, I haven't been able to reach the pt.. I just tried her and she answered. She has an appt with CY 04/07/2017. She said she and Dr. Annamaria Boots had discussed putting her on fasenra. I asked her to be sure the nurse had her sign the necessary paperwork. ( it's hard to get in touch with her. I 've been trying for 2 or 3 wks. Now.) Nothing further needed. Closing.

## 2017-04-04 NOTE — Telephone Encounter (Signed)
I received a fax on Ms. Sally, called pt. To make her aware. She was coming in for a xolair but she has none on hand and her SMN and PAN form exp.. Pt. Was supposed to see CY 04/07/17 but cancelled due to weather and rescheduled 05/02/17. She said she and CY discussed her starting [place]. Will wait to hear from pt. Or CY

## 2017-04-07 ENCOUNTER — Ambulatory Visit: Payer: Medicaid Other | Admitting: Internal Medicine

## 2017-04-07 IMAGING — DX DG CHEST 2V
2 series · 2 of 2 positions shown · non-contrast
Comparison: Chest x-rays dated 02/28/2016, 12/18/2015 and
11/26/2015.

CLINICAL DATA: Recurrent asthma exacerbation.

EXAM:
CHEST  2 VIEW

[chest pa]
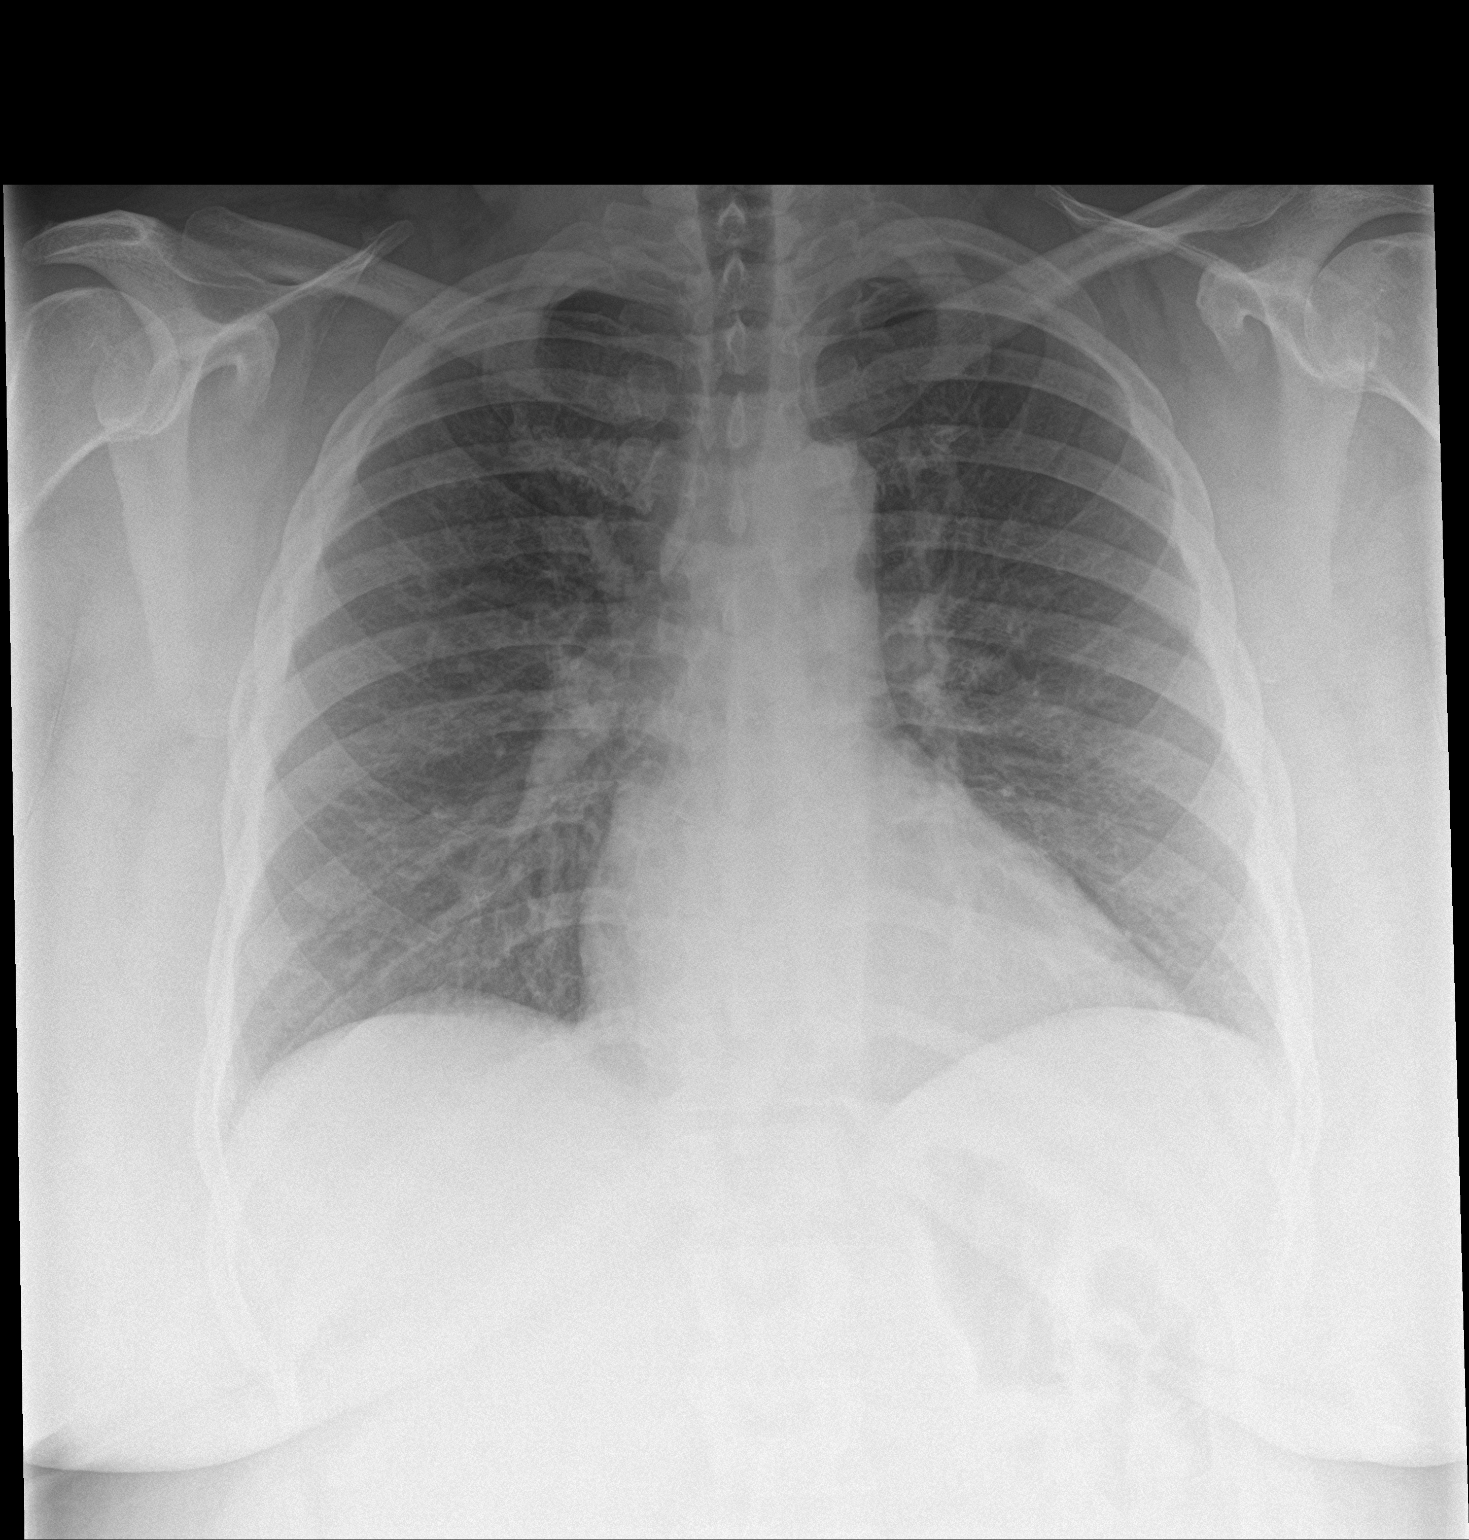

[chest lat]
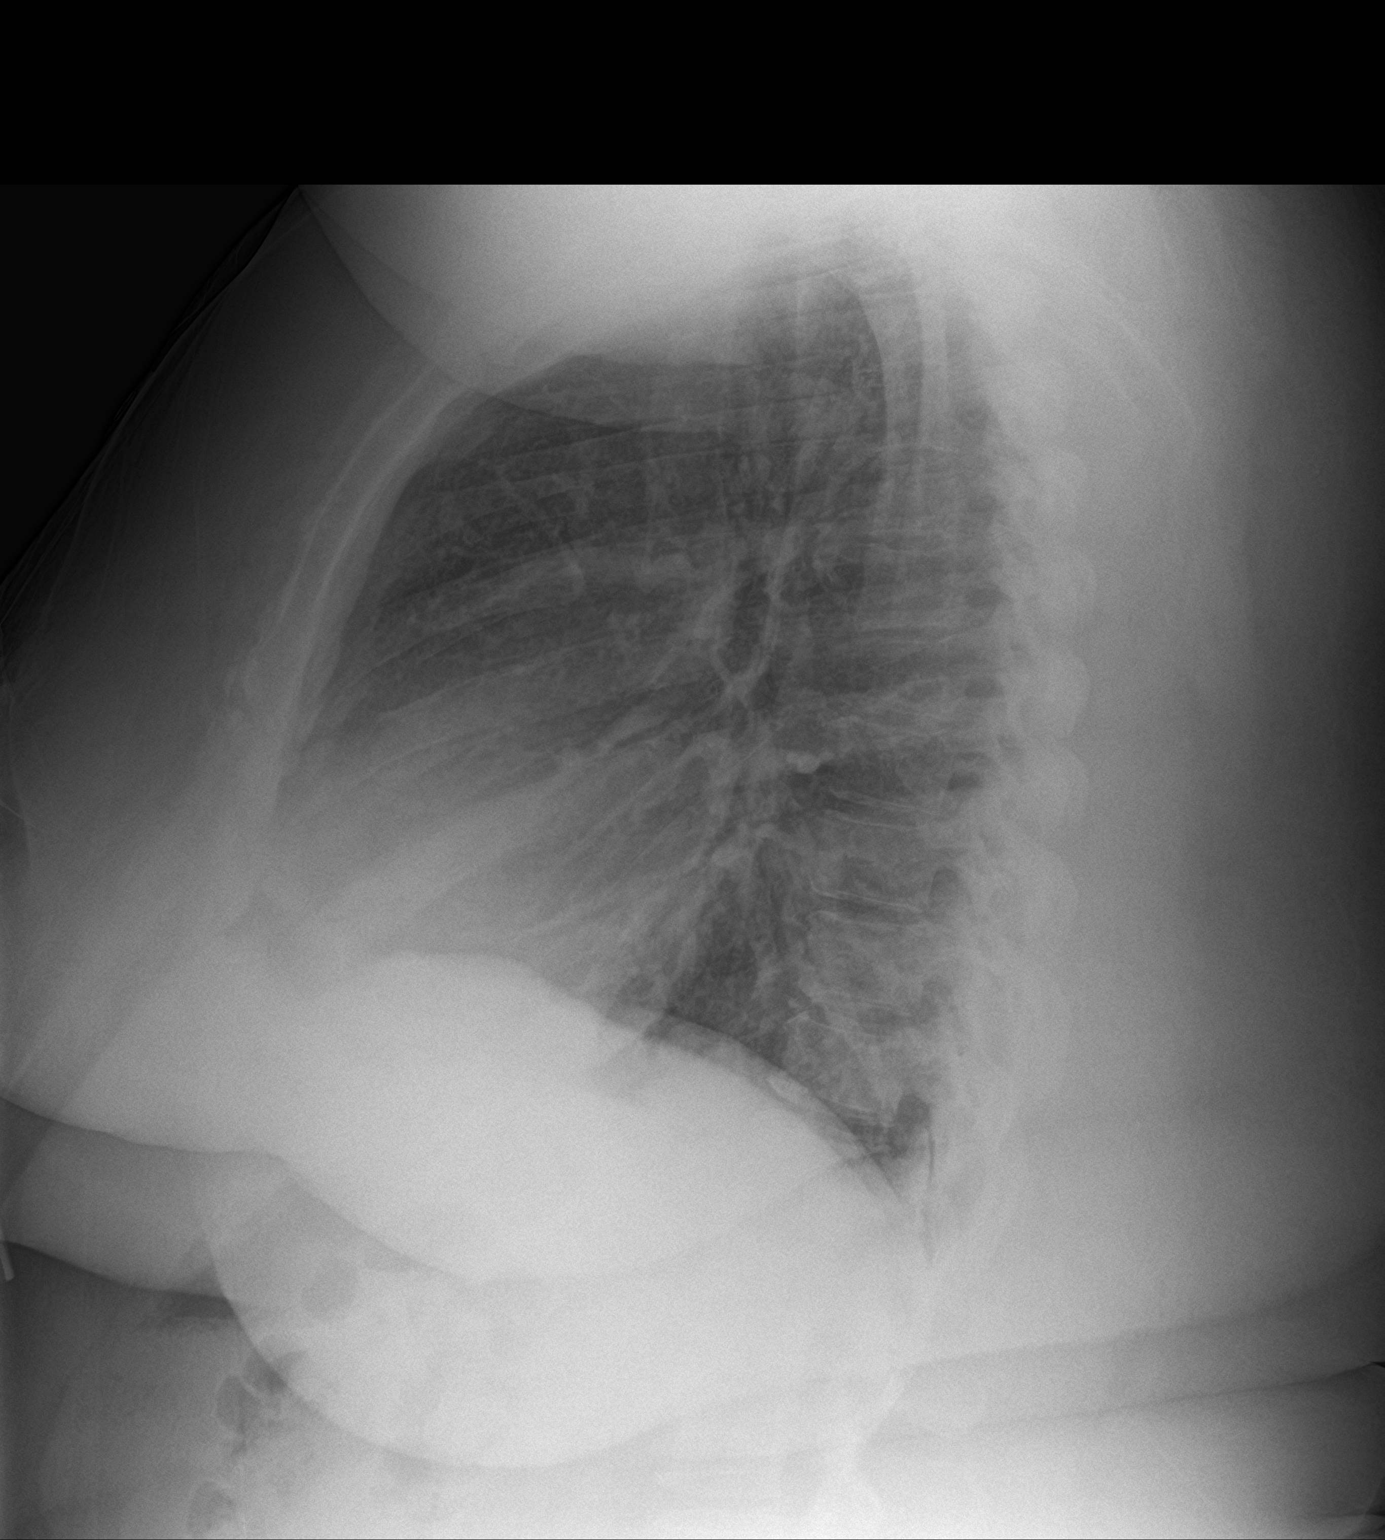

[2 of 2 positions shown; findings below may reference images not displayed]

FINDINGS: Cardiomediastinal silhouette is normal in size and configuration.
Lungs are clear. Lung volumes are normal. No evidence of pneumonia.
No pleural effusion. No pneumothorax.

Osseous and soft tissue structures about the chest are unremarkable.
IMPRESSION: No active cardiopulmonary disease. No evidence of pneumonia or
pulmonary edema.

## 2017-04-09 NOTE — Telephone Encounter (Signed)
Joellen Jersey- do you know where we stand on Fasenra for [place]? Or stay with Xolair.

## 2017-04-10 NOTE — Telephone Encounter (Signed)
I have tried to get pt to come 2 or 3 times. Last wk. 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 She has no xolair on hand. Her enrollent and PAN forms have expired. Does he want [place] to pursue xolair or fasenra ?  She was supposed to see CY 04/07/17. She rescheduled for 05/02/17. Please advise.

## 2017-04-10 NOTE — Telephone Encounter (Signed)
April [name] please provide any updates as to [place] start or continuing Xolair injections. Thanks.

## 2017-04-10 NOTE — Telephone Encounter (Signed)
Need to contact patient to come by and discuss Berna Bue and start process

## 2017-04-10 NOTE — Telephone Encounter (Signed)
We could not tell that Xolair was making much difference but she has had trouble coming regularly.  We have discussed possibility that Fasenra, with longer injection intervals, could be worth a try.  She continues to have ER visits and exacerbations by report.

## 2017-04-23 NOTE — Telephone Encounter (Signed)
Called pt. She is going to try and come in tomorrow to fill out fasenra paperwork, if she doesn't have to go to the hosp. Tonight. This was the second attempt to contact pt.. Fisrt time lmom pt. Didn't respond. Nothing further needed.

## 2017-04-24 ENCOUNTER — Emergency Department (HOSPITAL_COMMUNITY)
Admission: EM | Admit: 2017-04-24 | Discharge: 2017-04-24 | Disposition: A | Discharge status: Home / Self Care | Payer: Medicaid Other | Attending: Emergency Medicine | Admitting: Emergency Medicine

## 2017-04-24 ENCOUNTER — Encounter (HOSPITAL_COMMUNITY): Payer: Self-pay

## 2017-04-24 ENCOUNTER — Emergency Department (HOSPITAL_COMMUNITY): Payer: Medicaid Other

## 2017-04-24 ENCOUNTER — Other Ambulatory Visit: Payer: Self-pay

## 2017-04-24 DIAGNOSIS — I110 Hypertensive heart disease with heart failure: Secondary | ICD-10-CM | POA: Insufficient documentation

## 2017-04-24 DIAGNOSIS — J45998 Other asthma: Secondary | ICD-10-CM | POA: Insufficient documentation

## 2017-04-24 DIAGNOSIS — Z87891 Personal history of nicotine dependence: Secondary | ICD-10-CM | POA: Diagnosis not present

## 2017-04-24 DIAGNOSIS — Z79899 Other long term (current) drug therapy: Secondary | ICD-10-CM | POA: Insufficient documentation

## 2017-04-24 DIAGNOSIS — I5032 Chronic diastolic (congestive) heart failure: Secondary | ICD-10-CM | POA: Diagnosis not present

## 2017-04-24 DIAGNOSIS — J449 Chronic obstructive pulmonary disease, unspecified: Secondary | ICD-10-CM | POA: Diagnosis not present

## 2017-04-24 DIAGNOSIS — J4551 Severe persistent asthma with (acute) exacerbation: Secondary | ICD-10-CM | POA: Diagnosis not present

## 2017-04-24 DIAGNOSIS — I10 Essential (primary) hypertension: Secondary | ICD-10-CM | POA: Insufficient documentation

## 2017-04-24 DIAGNOSIS — R0602 Shortness of breath: Secondary | ICD-10-CM | POA: Diagnosis present

## 2017-04-24 MED ORDER — ALBUTEROL (5 MG/ML) CONTINUOUS INHALATION SOLN
10.0000 mg/h | INHALATION_SOLUTION | Freq: Once | RESPIRATORY_TRACT | Status: AC
  Administered 2017-04-24: 10 mg/h via RESPIRATORY_TRACT
  Filled 2017-04-24: qty 20

## 2017-04-24 MED ORDER — ALBUTEROL SULFATE (2.5 MG/3ML) 0.083% IN NEBU
5.0000 mg | INHALATION_SOLUTION | Freq: Once | RESPIRATORY_TRACT | Status: AC
  Administered 2017-04-24: 5 mg via RESPIRATORY_TRACT
  Filled 2017-04-24: qty 6

## 2017-04-24 MED ORDER — DEXAMETHASONE 4 MG PO TABS
8.0000 mg | ORAL_TABLET | Freq: Once | ORAL | Status: AC
  Administered 2017-04-24: 8 mg via ORAL
  Filled 2017-04-24: qty 2

## 2017-04-24 MED ORDER — IPRATROPIUM BROMIDE 0.02 % IN SOLN
0.5000 mg | Freq: Once | RESPIRATORY_TRACT | Status: AC
  Administered 2017-04-24: 0.5 mg via RESPIRATORY_TRACT
  Filled 2017-04-24 (×2): qty 2.5

## 2017-04-24 MED ORDER — ALBUTEROL SULFATE (2.5 MG/3ML) 0.083% IN NEBU: 5.0000 mg | INHALATION_SOLUTION | Freq: Once | RESPIRATORY_TRACT | Status: DC

## 2017-04-24 MED ORDER — ALBUTEROL SULFATE HFA 108 (90 BASE) MCG/ACT IN AERS
2.0000 | INHALATION_SPRAY | Freq: Once | RESPIRATORY_TRACT | Status: AC
  Administered 2017-04-24: 2 via RESPIRATORY_TRACT
  Filled 2017-04-24: qty 6.7

## 2017-04-24 NOTE — ED Provider Notes (Signed)
[facility] EMERGENCY DEPARTMENT Provider Note   CSN: [id number] Arrival date & time: 04/24/17  0043     History   Chief Complaint Chief Complaint  Patient presents with  . Asthma    HPI April [name] is a 46 y.o. female.  The history is provided by the patient.  Asthma  This is a recurrent problem. The current episode started yesterday. The problem occurs daily. The problem has been gradually worsening. Associated symptoms include shortness of breath. Pertinent negatives include no chest pain. The symptoms are aggravated by walking. The symptoms are relieved by rest.   Patient with h/o asthma presents with cough/wheezing/shortness of breath for past 24 hours No CP No fever No hemoptysis Similar to prior episodes of asthma Denies h/o intubation but has had admissions previously for asthma  Past Medical History:  Diagnosis Date  . Acanthosis nigricans   . Arthritis   . Asthma    Followed by Dr. Annamaria Boots (pulmonology); receives every other week omalizumab injections; has frequent exacerbations  . COPD (chronic obstructive pulmonary disease) ([place])    PFTs in 2002, FEV1/FVC 65, no post bronchodilater test done  . Depression   . GERD (gastroesophageal reflux disease)   . GERD (gastroesophageal reflux disease)   . Headache(784.0)   . Helicobacter pylori (H. pylori) infection   . Hypertension, essential   . Insomnia   . Menorrhagia   . Morbid obesity ([place])   . Obesity   . Seasonal allergies   . Shortness of breath   . Sleep apnea    Sleep study 2008 - mild OSA, not enough events to titrate CPAP  . Tobacco user     Patient Active Problem List   Diagnosis Date Noted  . Acute asthma exacerbation 02/15/2017  . Acute respiratory failure with hypoxia ([place]) 02/10/2017  . Dyspnea 02/04/2017  . Asthma 02/04/2017  . Hypertension, essential 02/04/2017  . Sleep apnea 02/04/2017  . Morbid obesity with BMI of 50.0-59.9, adult ([place]) 02/04/2017  . Chronic  diastolic heart failure, NYHA class 1 ([place]) 02/04/2017  . Acute hyperglycemia 02/04/2017  . Chronic diastolic CHF (congestive heart failure) ([place]) 01/17/2017  . SIRS (systemic inflammatory response syndrome) ([place]) 01/17/2017  . Depression 01/17/2017  . OSA (obstructive sleep apnea) 01/17/2017  . Asthma exacerbation 01/17/2017  . SOB (shortness of breath)   . Asthma exacerbation in COPD ([place]) 12/10/2016  . Moderate persistent asthma with exacerbation 10/19/2016  . Diarrhea 10/19/2016  . Normocytic anemia 10/02/2016  . Respiratory failure ([place]) 10/02/2016  . Hypertensive urgency, malignant 06/22/2016  . Pulmonary edema 06/22/2016  . Hypertensive urgency 06/22/2016  . Malignant hypertension due to primary aldosteronism ([place]) 05/05/2016  . Lip laceration 05/05/2016  . Elevated hemoglobin A1c 05/05/2016  . GERD (gastroesophageal reflux disease) 08/30/2015  . Anxiety and depression 08/30/2015  . Prediabetes 08/22/2015  . Seasonal allergic rhinitis 08/29/2013  . Leukocytosis 10/21/2012  . Tobacco abuse 10/07/2012  . Asthma, chronic obstructive, without status asthmaticus ([place]) 05/07/2012  . Knee pain, bilateral 04/25/2011  . Primary insomnia 03/14/2011  . Mild obstructive sleep apnea 12/19/2010  . Hypokalemia 08/13/2010  . Cervical back pain with evidence of disc disease 04/08/2008  . Essential hypertension 07/31/2006  . Morbid obesity with body mass index of 50.0-59.9 in adult ([place]) 06/17/2006  . Major depressive disorder, recurrent episode ([place]) 04/10/2006    Past Surgical History:  Procedure Laterality Date  . BREAST REDUCTION SURGERY  09/2011  . TUBAL LIGATION  1996   bilateral  OB History    No data available       Home Medications    Prior to Admission medications   Medication Sig Start Date End Date Taking? Authorizing Provider  albuterol (PROVENTIL HFA;VENTOLIN HFA) 108 (90 Base) MCG/ACT inhaler Inhale 2 puffs every 4 (four) hours as needed into the lungs for  wheezing or shortness of breath. 03/12/17  Yes Arnoldo Morale, MD  amLODipine (NORVASC) 10 MG tablet Take 1 tablet (10 mg total) daily by mouth. 03/12/17  Yes Amao, Enobong, MD  budesonide (PULMICORT) 0.25 MG/2ML nebulizer solution Take 2 mLs (0.25 mg total) 2 (two) times daily by nebulization. 03/12/17  Yes Arnoldo Morale, MD  busPIRone (BUSPAR) 7.5 MG tablet Take 1 tablet (7.5 mg total) 2 (two) times daily by mouth. 03/12/17  Yes Arnoldo Morale, MD  butalbital-acetaminophen-caffeine (FIORICET, ESGIC) 50-325-40 MG tablet Take 1-2 tablets by mouth every 6 (six) hours as needed for headache. 12/19/16 12/19/17 Yes Joy, Shawn C, PA-C  diclofenac (VOLTAREN) 75 MG EC tablet Take 1 tablet (75 mg total) 2 (two) times daily by mouth. 03/12/17  Yes Amao, Enobong, MD  famotidine (PEPCID) 20 MG tablet Take 1 tablet (20 mg total) by mouth 2 (two) times daily. 03/29/17  Yes Ward, Ozella Almond, PA-C  fluticasone (FLONASE) 50 MCG/ACT nasal spray Place 2 sprays into both nostrils daily. 12/13/16  Yes Rai, Ripudeep K, MD  furosemide (LASIX) 40 MG tablet Take 1 tablet (40 mg total) daily by mouth. 03/12/17 03/12/18 Yes Amao, Enobong, MD  guaiFENesin (MUCINEX) 600 MG 12 hr tablet Take 1 tablet (600 mg total) by mouth 2 (two) times daily. 12/13/16  Yes Rai, Ripudeep K, MD  loratadine (CLARITIN) 10 MG tablet Take 1 tablet (10 mg total) by mouth daily. 08/30/15  Yes [name], MD  losartan (COZAAR) 100 MG tablet Take 1 tablet (100 mg total) daily by mouth. 03/12/17  Yes Amao, Charlane Ferretti, MD  meclizine (ANTIVERT) 25 MG tablet Take 25 mg by mouth 3 (three) times daily as needed for dizziness.  01/15/17  Yes [provider]  metoprolol tartrate (LOPRESSOR) 100 MG tablet Take 1 tablet (100 mg total) by mouth 2 (two) times daily. 02/05/17  Yes Amao, Charlane Ferretti, MD  mometasone-formoterol (DULERA) 200-5 MCG/ACT AERO Inhale 2 puffs into the lungs 2 (two) times daily. 03/29/17  Yes Ward, Ozella Almond, PA-C  montelukast (SINGULAIR) 10  MG tablet Take 1 tablet (10 mg total) at bedtime by mouth. 03/12/17  Yes Amao, Enobong, MD  omeprazole (PRILOSEC) 20 MG capsule Take 1 capsule (20 mg total) by mouth daily. Patient taking differently: Take 20 mg by mouth 2 (two) times daily.  10/09/16  Yes Baird Lyons D, MD  sertraline (ZOLOFT) 100 MG tablet Take 1 tablet (100 mg total) daily by mouth. 03/12/17  Yes Amao, Charlane Ferretti, MD  spironolactone (ALDACTONE) 100 MG tablet Take 1 tablet (100 mg total) daily by mouth. 03/12/17  Yes Amao, Enobong, MD  zolpidem (AMBIEN) 10 MG tablet Take 10 mg by mouth at bedtime. 09/27/10  Yes [provider]  arformoterol (BROVANA) 15 MCG/2ML NEBU Take 2 mLs (15 mcg total) 2 (two) times daily by nebulization. Patient not taking: Reported on 03/29/2017 03/12/17   Arnoldo Morale, MD  nicotine (NICODERM CQ - DOSED IN MG/24 HOURS) 14 mg/24hr patch Place 1 patch (14 mg total) onto the skin daily. Patient not taking: Reported on 04/24/2017 10/04/16   Reyne Dumas, MD  ondansetron (ZOFRAN ODT) 4 MG disintegrating tablet Take 1 tablet (4 mg total) by mouth  every 8 (eight) hours as needed for nausea or vomiting. Patient not taking: Reported on 04/24/2017 03/29/17   Ward, Ozella Almond, PA-C  potassium chloride (K-DUR) 10 MEQ tablet Take 1 tablet (10 mEq total) by mouth daily for 7 days. Patient not taking: Reported on 04/24/2017 03/29/17 04/24/17  Ward, Ozella Almond, PA-C  predniSONE (DELTASONE) 20 MG tablet Take 2 tablets (40 mg total) by mouth daily. Patient not taking: Reported on 04/24/2017 03/29/17   Ward, Ozella Almond, PA-C    Family History Family History  Problem Relation Age of Onset  . Hypertension Mother   . Asthma Daughter   . Cancer Paternal Aunt   . Asthma Maternal Grandmother     Social History Social History   Tobacco Use  . Smoking status: Former Smoker    Packs/day: 0.50    Years: 18.00    Pack years: 9.00    Types: Cigarettes    Last attempt to quit: 04/29/2014    Years since  quitting: 2.9  . Smokeless tobacco: Never Used  Substance Use Topics  . Alcohol use: No    Alcohol/week: 0.0 oz  . Drug use: No     Allergies   Patient has no known allergies.   Review of Systems Review of Systems  Constitutional: Negative for fever.  Respiratory: Positive for shortness of breath.   Cardiovascular: Negative for chest pain.  All other systems reviewed and are negative.    Physical Exam Updated Vital Signs BP (!) 171/87 (BP Location: Right Arm)   Pulse 96   Temp 98.3 F (36.8 C) (Oral)   Resp (!) 22   LMP 03/29/2017 (LMP Unknown)   SpO2 93%   Physical Exam CONSTITUTIONAL: Well developed/well nourished HEAD: Normocephalic/atraumatic EYES: EOMI ENMT: Mucous membranes moist, uvula midline no erythema or exudate NECK: supple no meningeal signs SPINE/BACK:entire spine nontender CV: S1/S2 noted, no murmurs/rubs/gallops noted LUNGS: Wheeze noted bilaterally, no crackles noted, mild tachypnea noted ABDOMEN: soft, nontender, no rebound or guarding, bowel sounds noted throughout abdomen GU:no cva tenderness NEURO: Pt is awake/alert/appropriate, moves all extremitiesx4.  EXTREMITIES: pulses normal/equal, full ROM, no calf tenderness or edema SKIN: warm, color normal PSYCH: no abnormalities of mood noted, alert and oriented to situation   ED Treatments / Results  Labs (all labs ordered are listed, but only abnormal results are displayed) Labs Reviewed - No data to display  EKG  EKG Interpretation  Date/Time:  Thursday April 24 2017 00:48:26 EST Ventricular Rate:  95 PR Interval:  130 QRS Duration: 80 QT Interval:  382 QTC Calculation: 480 R Axis:   98 Text Interpretation:  Sinus rhythm with frequent Premature ventricular complexes and Fusion complexes Interpretation limited secondary to artifact Confirmed by Ripley Fraise [phone]) on 04/24/2017 2:20:42 AM       Radiology Dg Chest 2 View  Result Date: 04/24/2017 CLINICAL DATA:  Acute  onset of shortness of breath. EXAM: CHEST  2 VIEW COMPARISON:  Chest radiograph performed 03/29/2017 FINDINGS: The lungs are well-aerated and clear. There is no evidence of focal opacification, pleural effusion or pneumothorax. The heart is normal in size; the mediastinal contour is within normal limits. No acute osseous abnormalities are seen. IMPRESSION: No acute cardiopulmonary process seen. Electronically Signed   By: Garald Balding M.D.   On: 04/24/2017 01:27    Procedures Procedures (including critical care time)  Medications Ordered in ED Medications  albuterol (PROVENTIL HFA;VENTOLIN HFA) 108 (90 Base) MCG/ACT inhaler 2 puff (not administered)  albuterol (PROVENTIL) (2.5 MG/3ML) 0.083% nebulizer  solution 5 mg (5 mg Nebulization Given 04/24/17 0054)  ipratropium (ATROVENT) nebulizer solution 0.5 mg (0.5 mg Nebulization Given 04/24/17 0248)  dexamethasone (DECADRON) tablet 8 mg (8 mg Oral Given 04/24/17 0239)  albuterol (PROVENTIL,VENTOLIN) solution continuous neb (10 mg/hr Nebulization Given 04/24/17 0249)     Initial Impression / Assessment and Plan / ED Course  I have reviewed the triage vital signs and the nursing notes.  Pertinent imaging results that were available during my care of the patient were reviewed by me and considered in my medical decision making (see chart for details).     Patient presents with asthma exacerbation, she is requesting hour-long neb treatment, will also give Decadron. 4:15 AM Patient reports she feels improved. She continues to have wheeze but is much improved, better air entry She feels she is getting back to baseline and would like to go home We discussed appropriate use of albuterol She has follow-up with pulmonology in early January I feel she is otherwise appropriate and safe for discharge home at this time Final Clinical Impressions(s) / ED Diagnoses   Final diagnoses:  Severe persistent asthma with exacerbation    ED Discharge Orders     None       Ripley Fraise, MD 04/24/17 [phone]

## 2017-04-24 NOTE — ED Notes (Signed)
Pt ambulate to bathroom 

## 2017-04-24 NOTE — ED Notes (Signed)
Breathing treatment has finished

## 2017-04-24 NOTE — ED Triage Notes (Signed)
Pt arrives to ed with c/o asthma flare up; pt has audible  Wheezes at triage; pt denies pain; pt a&ox 4.-Monique,RN

## 2017-04-27 ENCOUNTER — Other Ambulatory Visit: Payer: Self-pay

## 2017-04-27 ENCOUNTER — Emergency Department (HOSPITAL_COMMUNITY): Payer: Medicaid Other

## 2017-04-27 ENCOUNTER — Inpatient Hospital Stay (HOSPITAL_COMMUNITY)
Admission: EM | Admit: 2017-04-27 | Discharge: 2017-04-30 | DRG: 202 | Disposition: A | Discharge status: Home / Self Care | Payer: Medicaid Other | Attending: Internal Medicine | Admitting: Internal Medicine

## 2017-04-27 ENCOUNTER — Encounter (HOSPITAL_COMMUNITY): Payer: Self-pay | Admitting: Physician Assistant

## 2017-04-27 DIAGNOSIS — F32A Depression, unspecified: Secondary | ICD-10-CM | POA: Diagnosis present

## 2017-04-27 DIAGNOSIS — D649 Anemia, unspecified: Secondary | ICD-10-CM | POA: Diagnosis present

## 2017-04-27 DIAGNOSIS — D72828 Other elevated white blood cell count: Secondary | ICD-10-CM | POA: Diagnosis present

## 2017-04-27 DIAGNOSIS — I10 Essential (primary) hypertension: Secondary | ICD-10-CM | POA: Diagnosis not present

## 2017-04-27 DIAGNOSIS — F329 Major depressive disorder, single episode, unspecified: Secondary | ICD-10-CM | POA: Diagnosis present

## 2017-04-27 DIAGNOSIS — T380X5A Adverse effect of glucocorticoids and synthetic analogues, initial encounter: Secondary | ICD-10-CM | POA: Diagnosis present

## 2017-04-27 DIAGNOSIS — D509 Iron deficiency anemia, unspecified: Secondary | ICD-10-CM | POA: Diagnosis present

## 2017-04-27 DIAGNOSIS — K219 Gastro-esophageal reflux disease without esophagitis: Secondary | ICD-10-CM | POA: Diagnosis present

## 2017-04-27 DIAGNOSIS — Z6841 Body Mass Index (BMI) 40.0 and over, adult: Secondary | ICD-10-CM | POA: Diagnosis not present

## 2017-04-27 DIAGNOSIS — Z7952 Long term (current) use of systemic steroids: Secondary | ICD-10-CM

## 2017-04-27 DIAGNOSIS — I110 Hypertensive heart disease with heart failure: Secondary | ICD-10-CM | POA: Diagnosis present

## 2017-04-27 DIAGNOSIS — E876 Hypokalemia: Secondary | ICD-10-CM | POA: Diagnosis not present

## 2017-04-27 DIAGNOSIS — Z79899 Other long term (current) drug therapy: Secondary | ICD-10-CM

## 2017-04-27 DIAGNOSIS — Z720 Tobacco use: Secondary | ICD-10-CM | POA: Diagnosis present

## 2017-04-27 DIAGNOSIS — E119 Type 2 diabetes mellitus without complications: Secondary | ICD-10-CM | POA: Diagnosis present

## 2017-04-27 DIAGNOSIS — J4541 Moderate persistent asthma with (acute) exacerbation: Secondary | ICD-10-CM | POA: Diagnosis present

## 2017-04-27 DIAGNOSIS — R7303 Prediabetes: Secondary | ICD-10-CM | POA: Diagnosis present

## 2017-04-27 DIAGNOSIS — R0602 Shortness of breath: Secondary | ICD-10-CM

## 2017-04-27 DIAGNOSIS — G4700 Insomnia, unspecified: Secondary | ICD-10-CM | POA: Diagnosis present

## 2017-04-27 DIAGNOSIS — R062 Wheezing: Secondary | ICD-10-CM

## 2017-04-27 DIAGNOSIS — I5032 Chronic diastolic (congestive) heart failure: Secondary | ICD-10-CM | POA: Diagnosis present

## 2017-04-27 DIAGNOSIS — Z765 Malingerer [conscious simulation]: Secondary | ICD-10-CM

## 2017-04-27 DIAGNOSIS — F419 Anxiety disorder, unspecified: Secondary | ICD-10-CM | POA: Diagnosis present

## 2017-04-27 DIAGNOSIS — F5101 Primary insomnia: Secondary | ICD-10-CM | POA: Diagnosis present

## 2017-04-27 DIAGNOSIS — J440 Chronic obstructive pulmonary disease with acute lower respiratory infection: Secondary | ICD-10-CM | POA: Diagnosis present

## 2017-04-27 DIAGNOSIS — J4551 Severe persistent asthma with (acute) exacerbation: Secondary | ICD-10-CM | POA: Diagnosis not present

## 2017-04-27 DIAGNOSIS — G4733 Obstructive sleep apnea (adult) (pediatric): Secondary | ICD-10-CM | POA: Diagnosis present

## 2017-04-27 DIAGNOSIS — D72829 Elevated white blood cell count, unspecified: Secondary | ICD-10-CM | POA: Diagnosis present

## 2017-04-27 DIAGNOSIS — R059 Cough, unspecified: Secondary | ICD-10-CM

## 2017-04-27 DIAGNOSIS — J45901 Unspecified asthma with (acute) exacerbation: Secondary | ICD-10-CM | POA: Diagnosis present

## 2017-04-27 DIAGNOSIS — F17200 Nicotine dependence, unspecified, uncomplicated: Secondary | ICD-10-CM | POA: Diagnosis present

## 2017-04-27 DIAGNOSIS — R05 Cough: Secondary | ICD-10-CM

## 2017-04-27 DIAGNOSIS — J189 Pneumonia, unspecified organism: Secondary | ICD-10-CM | POA: Diagnosis present

## 2017-04-27 HISTORY — DX: Obstructive sleep apnea (adult) (pediatric): G47.33

## 2017-04-27 HISTORY — DX: Anxiety disorder, unspecified: F41.9

## 2017-04-27 HISTORY — DX: Pneumonia, unspecified organism: J18.9

## 2017-04-27 HISTORY — DX: Obstructive sleep apnea (adult) (pediatric): Z99.89

## 2017-04-27 LAB — CBC WITH DIFFERENTIAL/PLATELET
Basophils Absolute: 0 10*3/uL (ref 0.0–0.1)
Basophils Relative: 0 %
EOS ABS: 1.1 10*3/uL — AB (ref 0.0–0.7)
EOS PCT: 6 %
HCT: 36.5 % (ref 36.0–46.0)
Hemoglobin: 11.8 g/dL — ABNORMAL LOW (ref 12.0–15.0)
LYMPHS ABS: 4.4 10*3/uL — AB (ref 0.7–4.0)
Lymphocytes Relative: 24 %
MCH: 28.2 pg (ref 26.0–34.0)
MCHC: 32.3 g/dL (ref 30.0–36.0)
MCV: 87.1 fL (ref 78.0–100.0)
MONO ABS: 1 10*3/uL (ref 0.1–1.0)
MONOS PCT: 6 %
Neutro Abs: 12.1 10*3/uL — ABNORMAL HIGH (ref 1.7–7.7)
Neutrophils Relative %: 64 %
PLATELETS: 338 10*3/uL (ref 150–400)
RBC: 4.19 MIL/uL (ref 3.87–5.11)
RDW: 16.8 % — ABNORMAL HIGH (ref 11.5–15.5)
WBC: 18.7 10*3/uL — AB (ref 4.0–10.5)

## 2017-04-27 LAB — BASIC METABOLIC PANEL
Anion gap: 8 (ref 5–15)
BUN: 7 mg/dL (ref 6–20)
CO2: 26 mmol/L (ref 22–32)
CREATININE: 0.82 mg/dL (ref 0.44–1.00)
Calcium: 7.8 mg/dL — ABNORMAL LOW (ref 8.9–10.3)
Chloride: 101 mmol/L (ref 101–111)
GFR calc Af Amer: >60 mL/min (ref >60)
GLUCOSE: 93 mg/dL (ref 65–99)
Potassium: 3.3 mmol/L — ABNORMAL LOW (ref 3.5–5.1)
SODIUM: 135 mmol/L (ref 135–145)

## 2017-04-27 LAB — I-STAT TROPONIN, ED: Troponin i, poc: 0 ng/mL (ref 0.00–0.08)

## 2017-04-27 LAB — CBG MONITORING, ED: GLUCOSE-CAPILLARY: 200 mg/dL — AB (ref 65–99)

## 2017-04-27 LAB — BRAIN NATRIURETIC PEPTIDE: B NATRIURETIC PEPTIDE 5: 21.7 pg/mL (ref 0.0–100.0)

## 2017-04-27 LAB — GLUCOSE, CAPILLARY: GLUCOSE-CAPILLARY: 205 mg/dL — AB (ref 65–99)

## 2017-04-27 MED ORDER — ALBUTEROL SULFATE (2.5 MG/3ML) 0.083% IN NEBU
5.0000 mg | INHALATION_SOLUTION | Freq: Once | RESPIRATORY_TRACT | Status: AC
  Administered 2017-04-27: 5 mg via RESPIRATORY_TRACT
  Filled 2017-04-27: qty 6

## 2017-04-27 MED ORDER — IPRATROPIUM BROMIDE 0.02 % IN SOLN: 0.5000 mg | RESPIRATORY_TRACT | Status: DC

## 2017-04-27 MED ORDER — ALBUTEROL SULFATE (2.5 MG/3ML) 0.083% IN NEBU
INHALATION_SOLUTION | RESPIRATORY_TRACT | Status: AC
  Administered 2017-04-27: 09:00:00
  Filled 2017-04-27: qty 6

## 2017-04-27 MED ORDER — ALBUTEROL (5 MG/ML) CONTINUOUS INHALATION SOLN
10.0000 mg/h | INHALATION_SOLUTION | RESPIRATORY_TRACT | Status: DC
  Administered 2017-04-27: 10 mg/h via RESPIRATORY_TRACT
  Filled 2017-04-27 (×2): qty 20

## 2017-04-27 MED ORDER — FAMOTIDINE 20 MG PO TABS
20.0000 mg | ORAL_TABLET | Freq: Two times a day (BID) | ORAL | Status: DC
  Administered 2017-04-27 – 2017-04-30 (×6): 20 mg via ORAL
  Filled 2017-04-27 (×7): qty 1

## 2017-04-27 MED ORDER — BUDESONIDE 0.25 MG/2ML IN SUSP
0.2500 mg | Freq: Two times a day (BID) | RESPIRATORY_TRACT | Status: DC
  Administered 2017-04-28 – 2017-04-30 (×5): 0.25 mg via RESPIRATORY_TRACT
  Filled 2017-04-27 (×5): qty 2

## 2017-04-27 MED ORDER — SPIRONOLACTONE 100 MG PO TABS
100.0000 mg | ORAL_TABLET | Freq: Every day | ORAL | Status: DC
  Administered 2017-04-28 – 2017-04-30 (×3): 100 mg via ORAL
  Filled 2017-04-27 (×3): qty 1

## 2017-04-27 MED ORDER — INSULIN ASPART 100 UNIT/ML ~~LOC~~ SOLN
0.0000 [IU] | Freq: Three times a day (TID) | SUBCUTANEOUS | Status: DC
  Administered 2017-04-28: 1 [IU] via SUBCUTANEOUS
  Administered 2017-04-28: 2 [IU] via SUBCUTANEOUS
  Administered 2017-04-28: 1 [IU] via SUBCUTANEOUS
  Administered 2017-04-29 – 2017-04-30 (×4): 2 [IU] via SUBCUTANEOUS
  Administered 2017-04-30: 1 [IU] via SUBCUTANEOUS

## 2017-04-27 MED ORDER — BUTALBITAL-APAP-CAFFEINE 50-325-40 MG PO TABS: 1.0000 | ORAL_TABLET | Freq: Four times a day (QID) | ORAL | Status: DC | PRN

## 2017-04-27 MED ORDER — KETOROLAC TROMETHAMINE 30 MG/ML IJ SOLN: 30.0000 mg | Freq: Four times a day (QID) | INTRAMUSCULAR | Status: DC | PRN

## 2017-04-27 MED ORDER — IPRATROPIUM BROMIDE 0.02 % IN SOLN
RESPIRATORY_TRACT | Status: AC
  Administered 2017-04-27: 0.5 mg via RESPIRATORY_TRACT
  Filled 2017-04-27: qty 2.5

## 2017-04-27 MED ORDER — NICOTINE 14 MG/24HR TD PT24
14.0000 mg | MEDICATED_PATCH | Freq: Every day | TRANSDERMAL | Status: DC
  Administered 2017-04-28 – 2017-04-29 (×3): 14 mg via TRANSDERMAL
  Filled 2017-04-27 (×3): qty 1

## 2017-04-27 MED ORDER — POTASSIUM CHLORIDE CRYS ER 20 MEQ PO TBCR
40.0000 meq | EXTENDED_RELEASE_TABLET | Freq: Once | ORAL | Status: AC
  Administered 2017-04-27: 40 meq via ORAL
  Filled 2017-04-27: qty 2

## 2017-04-27 MED ORDER — ALBUTEROL SULFATE (2.5 MG/3ML) 0.083% IN NEBU
5.0000 mg | INHALATION_SOLUTION | Freq: Once | RESPIRATORY_TRACT | Status: AC
  Administered 2017-04-27: 5 mg via RESPIRATORY_TRACT

## 2017-04-27 MED ORDER — MONTELUKAST SODIUM 10 MG PO TABS
10.0000 mg | ORAL_TABLET | Freq: Every day | ORAL | Status: DC
  Administered 2017-04-27 – 2017-04-29 (×3): 10 mg via ORAL
  Filled 2017-04-27 (×4): qty 1

## 2017-04-27 MED ORDER — ONDANSETRON HCL 4 MG/2ML IJ SOLN: 4.0000 mg | Freq: Four times a day (QID) | INTRAMUSCULAR | Status: DC | PRN

## 2017-04-27 MED ORDER — HYDROCODONE-ACETAMINOPHEN 5-325 MG PO TABS
1.0000 | ORAL_TABLET | ORAL | Status: DC | PRN
  Administered 2017-04-28 (×2): 1 via ORAL
  Administered 2017-04-29 – 2017-04-30 (×3): 2 via ORAL
  Filled 2017-04-27 (×2): qty 2
  Filled 2017-04-27: qty 1
  Filled 2017-04-27 (×2): qty 2

## 2017-04-27 MED ORDER — ALBUTEROL SULFATE (2.5 MG/3ML) 0.083% IN NEBU
5.0000 mg | INHALATION_SOLUTION | RESPIRATORY_TRACT | Status: DC | PRN
  Administered 2017-04-29 – 2017-04-30 (×3): 5 mg via RESPIRATORY_TRACT
  Filled 2017-04-27 (×3): qty 6

## 2017-04-27 MED ORDER — METHYLPREDNISOLONE SODIUM SUCC 125 MG IJ SOLR
125.0000 mg | Freq: Once | INTRAMUSCULAR | Status: AC
  Administered 2017-04-27: 125 mg via INTRAVENOUS
  Filled 2017-04-27: qty 2

## 2017-04-27 MED ORDER — ARFORMOTEROL TARTRATE 15 MCG/2ML IN NEBU
15.0000 ug | INHALATION_SOLUTION | Freq: Two times a day (BID) | RESPIRATORY_TRACT | Status: DC
  Administered 2017-04-28: 15 ug via RESPIRATORY_TRACT
  Filled 2017-04-27: qty 2

## 2017-04-27 MED ORDER — ACETAMINOPHEN 650 MG RE SUPP: 650.0000 mg | Freq: Four times a day (QID) | RECTAL | Status: DC | PRN

## 2017-04-27 MED ORDER — IPRATROPIUM BROMIDE 0.02 % IN SOLN
0.5000 mg | Freq: Once | RESPIRATORY_TRACT | Status: AC
  Administered 2017-04-27: 0.5 mg via RESPIRATORY_TRACT
  Filled 2017-04-27: qty 2.5

## 2017-04-27 MED ORDER — FLUTICASONE PROPIONATE 50 MCG/ACT NA SUSP
2.0000 | Freq: Every day | NASAL | Status: DC
  Administered 2017-04-28 – 2017-04-30 (×3): 2 via NASAL
  Filled 2017-04-27: qty 16

## 2017-04-27 MED ORDER — ONDANSETRON HCL 4 MG PO TABS: 4.0000 mg | ORAL_TABLET | Freq: Four times a day (QID) | ORAL | Status: DC | PRN

## 2017-04-27 MED ORDER — METHYLPREDNISOLONE SODIUM SUCC 125 MG IJ SOLR
80.0000 mg | Freq: Two times a day (BID) | INTRAMUSCULAR | Status: DC
  Administered 2017-04-27 – 2017-04-28 (×2): 80 mg via INTRAVENOUS
  Filled 2017-04-27 (×2): qty 2

## 2017-04-27 MED ORDER — ENOXAPARIN SODIUM 80 MG/0.8ML ~~LOC~~ SOLN
75.0000 mg | SUBCUTANEOUS | Status: DC
  Administered 2017-04-28 – 2017-04-29 (×2): 75 mg via SUBCUTANEOUS
  Filled 2017-04-27 (×4): qty 0.8

## 2017-04-27 MED ORDER — LOSARTAN POTASSIUM 50 MG PO TABS
100.0000 mg | ORAL_TABLET | Freq: Every day | ORAL | Status: DC
  Administered 2017-04-28 – 2017-04-30 (×3): 100 mg via ORAL
  Filled 2017-04-27 (×3): qty 2

## 2017-04-27 MED ORDER — ACETAMINOPHEN 325 MG PO TABS: 650.0000 mg | ORAL_TABLET | Freq: Four times a day (QID) | ORAL | Status: DC | PRN

## 2017-04-27 MED ORDER — BISACODYL 10 MG RE SUPP: 10.0000 mg | Freq: Every day | RECTAL | Status: DC | PRN

## 2017-04-27 MED ORDER — MAGNESIUM SULFATE 2 GM/50ML IV SOLN
2.0000 g | Freq: Once | INTRAVENOUS | Status: AC
  Administered 2017-04-27: 2 g via INTRAVENOUS
  Filled 2017-04-27: qty 50

## 2017-04-27 MED ORDER — SERTRALINE HCL 100 MG PO TABS
100.0000 mg | ORAL_TABLET | Freq: Every day | ORAL | Status: DC
  Administered 2017-04-28 – 2017-04-30 (×3): 100 mg via ORAL
  Filled 2017-04-27 (×3): qty 1

## 2017-04-27 MED ORDER — METOPROLOL TARTRATE 25 MG PO TABS
100.0000 mg | ORAL_TABLET | Freq: Two times a day (BID) | ORAL | Status: DC
  Administered 2017-04-27 – 2017-04-30 (×6): 100 mg via ORAL
  Filled 2017-04-27 (×7): qty 4

## 2017-04-27 MED ORDER — SENNOSIDES-DOCUSATE SODIUM 8.6-50 MG PO TABS: 1.0000 | ORAL_TABLET | Freq: Every evening | ORAL | Status: DC | PRN

## 2017-04-27 MED ORDER — BUSPIRONE HCL 15 MG PO TABS
7.5000 mg | ORAL_TABLET | Freq: Two times a day (BID) | ORAL | Status: DC
  Administered 2017-04-27 – 2017-04-30 (×6): 7.5 mg via ORAL
  Filled 2017-04-27 (×7): qty 1

## 2017-04-27 MED ORDER — METHYLPREDNISOLONE SODIUM SUCC 125 MG IJ SOLR: 60.0000 mg | Freq: Two times a day (BID) | INTRAMUSCULAR | Status: DC

## 2017-04-27 MED ORDER — ZOLPIDEM TARTRATE 5 MG PO TABS
10.0000 mg | ORAL_TABLET | Freq: Every day | ORAL | Status: DC
  Administered 2017-04-27 – 2017-04-29 (×3): 10 mg via ORAL
  Filled 2017-04-27 (×3): qty 2

## 2017-04-27 MED ORDER — AMLODIPINE BESYLATE 5 MG PO TABS
10.0000 mg | ORAL_TABLET | Freq: Every day | ORAL | Status: DC
  Administered 2017-04-28 – 2017-04-30 (×3): 10 mg via ORAL
  Filled 2017-04-27 (×3): qty 2

## 2017-04-27 MED ORDER — IPRATROPIUM-ALBUTEROL 0.5-2.5 (3) MG/3ML IN SOLN
3.0000 mL | RESPIRATORY_TRACT | Status: DC
Start: 2017-04-27 — End: 2017-04-30
  Administered 2017-04-27 – 2017-04-30 (×16): 3 mL via RESPIRATORY_TRACT
  Filled 2017-04-27 (×16): qty 3

## 2017-04-27 NOTE — ED Notes (Signed)
ED Provider at bedside. 

## 2017-04-27 NOTE — Progress Notes (Signed)
Notified patient that diet would not be changed at this time due to high blood sugar.  Patient states she may leave tonight.  Will continue to monitor.

## 2017-04-27 NOTE — ED Triage Notes (Signed)
Patient arrives with complaint of asthma exacerbation. States persistent since other recent visit on 12/27. At that time patient received breathing treatments and 2 pills per her report. States that yesterday and the day before she was mostly okay. Last night her breathing got drastically worse. Wheezing noted in all fields.

## 2017-04-27 NOTE — ED Provider Notes (Signed)
[facility] EMERGENCY DEPARTMENT Provider Note   CSN: [id number] Arrival date & time: 04/27/17  0254     History   Chief Complaint Chief Complaint  Patient presents with  . Shortness of Breath  . Asthma    HPI April [name] is a 46 y.o. female with a PMHx of asthma/COPD, GERD, morbid obesity, HTN, and other conditions listed below, who presents to the ED with complaints of gradually worsening SOB, dry cough, and wheezing x3 days. Chart review reveals she was seen in the ED on 04/24/17 for similar complaints, received a duoneb and then a CAT tx, had a negative CXR, and received 8mg  of PO decadron prior to being discharged in improved condition.  She states that she felt better the following day, but yesterday her symptoms worsened.  She states this feels similar to her prior asthma attacks.  She has been trying her albuterol inhaler and DuoNeb nebulizer at home with no relief, and received one albuterol 5mg  neb here without relief; symptoms worsen with activity.  Her PCP is Dr. Adrian Blackwater at [facility] and her pulmonologist is Dr. Annamaria Boots.  She is a former smoker who quit 2 years ago.  She denies any fevers, chills, hemoptysis, leg swelling, recent travel/surgery/immobilization, estrogen use, personal or family history of DVT/PE, chest pain, abd pain, N/V/D/C, hematuria, dysuria, myalgias, arthralgias, numbness, tingling, focal weakness, or any other complaints at this time.    The history is provided by the patient and medical records. No language interpreter was used.  Shortness of Breath  This is a recurrent problem. The average episode lasts 3 days. The problem occurs continuously.The current episode started more than 2 days ago. The problem has been gradually worsening. Associated symptoms include cough and wheezing. Pertinent negatives include no fever, no hemoptysis, no chest pain, no vomiting, no abdominal pain and no leg swelling. It is unknown what precipitated the problem. She  has tried beta-agonist inhalers and ipratropium inhalers for the symptoms. The treatment provided no relief. She has had prior hospitalizations. She has had prior ED visits. Associated medical issues include asthma and COPD. Associated medical issues do not include PE, DVT or recent surgery.  Asthma  Associated symptoms include shortness of breath. Pertinent negatives include no chest pain and no abdominal pain.    Past Medical History:  Diagnosis Date  . Acanthosis nigricans   . Arthritis   . Asthma    Followed by Dr. Annamaria Boots (pulmonology); receives every other week omalizumab injections; has frequent exacerbations  . COPD (chronic obstructive pulmonary disease) ([place])    PFTs in 2002, FEV1/FVC 65, no post bronchodilater test done  . Depression   . GERD (gastroesophageal reflux disease)   . GERD (gastroesophageal reflux disease)   . Headache(784.0)   . Helicobacter pylori (H. pylori) infection   . Hypertension, essential   . Insomnia   . Menorrhagia   . Morbid obesity ([place])   . Obesity   . Seasonal allergies   . Shortness of breath   . Sleep apnea    Sleep study 2008 - mild OSA, not enough events to titrate CPAP  . Tobacco user     Patient Active Problem List   Diagnosis Date Noted  . Acute asthma exacerbation 02/15/2017  . Acute respiratory failure with hypoxia ([place]) 02/10/2017  . Dyspnea 02/04/2017  . Asthma 02/04/2017  . Hypertension, essential 02/04/2017  . Sleep apnea 02/04/2017  . Morbid obesity with BMI of 50.0-59.9, adult ([place]) 02/04/2017  . Chronic diastolic heart  failure, NYHA class 1 ([place]) 02/04/2017  . Acute hyperglycemia 02/04/2017  . Chronic diastolic CHF (congestive heart failure) ([place]) 01/17/2017  . SIRS (systemic inflammatory response syndrome) ([place]) 01/17/2017  . Depression 01/17/2017  . OSA (obstructive sleep apnea) 01/17/2017  . Asthma exacerbation 01/17/2017  . SOB (shortness of breath)   . Asthma exacerbation in COPD ([place]) 12/10/2016  . Moderate  persistent asthma with exacerbation 10/19/2016  . Diarrhea 10/19/2016  . Normocytic anemia 10/02/2016  . Respiratory failure ([place]) 10/02/2016  . Hypertensive urgency, malignant 06/22/2016  . Pulmonary edema 06/22/2016  . Hypertensive urgency 06/22/2016  . Malignant hypertension due to primary aldosteronism ([place]) 05/05/2016  . Lip laceration 05/05/2016  . Elevated hemoglobin A1c 05/05/2016  . GERD (gastroesophageal reflux disease) 08/30/2015  . Anxiety and depression 08/30/2015  . Prediabetes 08/22/2015  . Seasonal allergic rhinitis 08/29/2013  . Leukocytosis 10/21/2012  . Tobacco abuse 10/07/2012  . Asthma, chronic obstructive, without status asthmaticus ([place]) 05/07/2012  . Knee pain, bilateral 04/25/2011  . Primary insomnia 03/14/2011  . Mild obstructive sleep apnea 12/19/2010  . Hypokalemia 08/13/2010  . Cervical back pain with evidence of disc disease 04/08/2008  . Essential hypertension 07/31/2006  . Morbid obesity with body mass index of 50.0-59.9 in adult ([place]) 06/17/2006  . Major depressive disorder, recurrent episode ([place]) 04/10/2006    Past Surgical History:  Procedure Laterality Date  . BREAST REDUCTION SURGERY  09/2011  . TUBAL LIGATION  1996   bilateral    OB History    No data available       Home Medications    Prior to Admission medications   Medication Sig Start Date End Date Taking? Authorizing Provider  albuterol (PROVENTIL HFA;VENTOLIN HFA) 108 (90 Base) MCG/ACT inhaler Inhale 2 puffs every 4 (four) hours as needed into the lungs for wheezing or shortness of breath. 03/12/17   Arnoldo Morale, MD  amLODipine (NORVASC) 10 MG tablet Take 1 tablet (10 mg total) daily by mouth. 03/12/17   Arnoldo Morale, MD  arformoterol (BROVANA) 15 MCG/2ML NEBU Take 2 mLs (15 mcg total) 2 (two) times daily by nebulization. Patient not taking: Reported on 03/29/2017 03/12/17   Arnoldo Morale, MD  budesonide (PULMICORT) 0.25 MG/2ML nebulizer solution Take 2 mLs (0.25 mg  total) 2 (two) times daily by nebulization. 03/12/17   Arnoldo Morale, MD  busPIRone (BUSPAR) 7.5 MG tablet Take 1 tablet (7.5 mg total) 2 (two) times daily by mouth. 03/12/17   Arnoldo Morale, MD  butalbital-acetaminophen-caffeine (FIORICET, ESGIC) 50-325-40 MG tablet Take 1-2 tablets by mouth every 6 (six) hours as needed for headache. 12/19/16 12/19/17  Joy, Helane Gunther, PA-C  diclofenac (VOLTAREN) 75 MG EC tablet Take 1 tablet (75 mg total) 2 (two) times daily by mouth. 03/12/17   Arnoldo Morale, MD  famotidine (PEPCID) 20 MG tablet Take 1 tablet (20 mg total) by mouth 2 (two) times daily. 03/29/17   Ward, Ozella Almond, PA-C  fluticasone (FLONASE) 50 MCG/ACT nasal spray Place 2 sprays into both nostrils daily. 12/13/16   Rai, Vernelle Emerald, MD  furosemide (LASIX) 40 MG tablet Take 1 tablet (40 mg total) daily by mouth. 03/12/17 03/12/18  Arnoldo Morale, MD  guaiFENesin (MUCINEX) 600 MG 12 hr tablet Take 1 tablet (600 mg total) by mouth 2 (two) times daily. 12/13/16   Rai, Vernelle Emerald, MD  loratadine (CLARITIN) 10 MG tablet Take 1 tablet (10 mg total) by mouth daily. 08/30/15   [name], MD  losartan (COZAAR) 100 MG tablet Take 1 tablet (100  mg total) daily by mouth. 03/12/17   Arnoldo Morale, MD  meclizine (ANTIVERT) 25 MG tablet Take 25 mg by mouth 3 (three) times daily as needed for dizziness.  01/15/17   [provider]  metoprolol tartrate (LOPRESSOR) 100 MG tablet Take 1 tablet (100 mg total) by mouth 2 (two) times daily. 02/05/17   Arnoldo Morale, MD  mometasone-formoterol (DULERA) 200-5 MCG/ACT AERO Inhale 2 puffs into the lungs 2 (two) times daily. 03/29/17   Ward, Ozella Almond, PA-C  montelukast (SINGULAIR) 10 MG tablet Take 1 tablet (10 mg total) at bedtime by mouth. 03/12/17   Arnoldo Morale, MD  nicotine (NICODERM CQ - DOSED IN MG/24 HOURS) 14 mg/24hr patch Place 1 patch (14 mg total) onto the skin daily. Patient not taking: Reported on 04/24/2017 10/04/16   Reyne Dumas, MD  omeprazole  (PRILOSEC) 20 MG capsule Take 1 capsule (20 mg total) by mouth daily. Patient taking differently: Take 20 mg by mouth 2 (two) times daily.  10/09/16   Deneise Lever, MD  ondansetron (ZOFRAN ODT) 4 MG disintegrating tablet Take 1 tablet (4 mg total) by mouth every 8 (eight) hours as needed for nausea or vomiting. Patient not taking: Reported on 04/24/2017 03/29/17   Ward, Ozella Almond, PA-C  potassium chloride (K-DUR) 10 MEQ tablet Take 1 tablet (10 mEq total) by mouth daily for 7 days. Patient not taking: Reported on 04/24/2017 03/29/17 04/24/17  Ward, Ozella Almond, PA-C  predniSONE (DELTASONE) 20 MG tablet Take 2 tablets (40 mg total) by mouth daily. Patient not taking: Reported on 04/24/2017 03/29/17   Ward, Ozella Almond, PA-C  sertraline (ZOLOFT) 100 MG tablet Take 1 tablet (100 mg total) daily by mouth. 03/12/17   Arnoldo Morale, MD  spironolactone (ALDACTONE) 100 MG tablet Take 1 tablet (100 mg total) daily by mouth. 03/12/17   Arnoldo Morale, MD  zolpidem (AMBIEN) 10 MG tablet Take 10 mg by mouth at bedtime. 09/27/10   [provider]    Family History Family History  Problem Relation Age of Onset  . Hypertension Mother   . Asthma Daughter   . Cancer Paternal Aunt   . Asthma Maternal Grandmother     Social History Social History   Tobacco Use  . Smoking status: Former Smoker    Packs/day: 0.50    Years: 18.00    Pack years: 9.00    Types: Cigarettes    Last attempt to quit: 04/29/2014    Years since quitting: 2.9  . Smokeless tobacco: Never Used  Substance Use Topics  . Alcohol use: No    Alcohol/week: 0.0 oz  . Drug use: No     Allergies   Patient has no known allergies.   Review of Systems Review of Systems  Constitutional: Negative for chills and fever.  Respiratory: Positive for cough, shortness of breath and wheezing. Negative for hemoptysis.        No hemoptysis  Cardiovascular: Negative for chest pain and leg swelling.  Gastrointestinal: Negative  for abdominal pain, constipation, diarrhea, nausea and vomiting.  Genitourinary: Negative for dysuria and hematuria.  Musculoskeletal: Negative for arthralgias and myalgias.  Skin: Negative for color change.  Allergic/Immunologic: Negative for immunocompromised state.  Neurological: Negative for weakness and numbness.  Psychiatric/Behavioral: Negative for confusion.   All other systems reviewed and are negative for acute change except as noted in the HPI.    Physical Exam Updated Vital Signs BP 113/81 (BP Location: Left Wrist)   Pulse 94   Temp 98.1 F (  36.7 C) (Oral)   Resp (!) 22   Ht 5\' 6"  (1.676 m)   Wt (!) 149.7 kg (330 lb)   LMP 03/29/2017 (LMP Unknown)   SpO2 97%   BMI 53.26 kg/m   Physical Exam  Constitutional: She is oriented to person, place, and time. Vital signs are normal. She appears well-developed and well-nourished.  Non-toxic appearance. No distress.  Afebrile, nontoxic, NAD  HENT:  Head: Normocephalic and atraumatic.  Mouth/Throat: Oropharynx is clear and moist and mucous membranes are normal.  Eyes: Conjunctivae and EOM are normal. Right eye exhibits no discharge. Left eye exhibits no discharge.  Neck: Normal range of motion. Neck supple.  Cardiovascular: Normal rate, regular rhythm, normal heart sounds and intact distal pulses. Exam reveals no gallop and no friction rub.  No murmur heard. RRR, nl s1/s2, no m/r/g, distal pulses intact, no pedal edema   Pulmonary/Chest: Tachypnea noted. No respiratory distress. She has decreased breath sounds. She has wheezes. She has no rhonchi. She has no rales.  Slightly tachypneic, slight increased WOB but not significant; not in respiratory distress; speaking in fragmented sentences. Globally diminished lung sounds with diffuse expiratory wheezing, prolonged expiratory phase, no rhonchi/rales appreciated. No hypoxia, SpO2 99% on RA   Abdominal: Soft. Normal appearance and bowel sounds are normal. She exhibits no distension.  There is no tenderness. There is no rigidity, no rebound, no guarding, no CVA tenderness, no tenderness at McBurney's point and negative Murphy's sign.  Musculoskeletal: Normal range of motion.  Neurological: She is alert and oriented to person, place, and time. She has normal strength. No sensory deficit.  Skin: Skin is warm, dry and intact. No rash noted.  Psychiatric: She has a normal mood and affect.  Nursing note and vitals reviewed.    ED Treatments / Results  Labs (all labs ordered are listed, but only abnormal results are displayed) Labs Reviewed  CBC WITH DIFFERENTIAL/PLATELET - Abnormal; Notable for the following components:      Result Value   WBC 18.7 (*)    Hemoglobin 11.8 (*)    RDW 16.8 (*)    Neutro Abs 12.1 (*)    Lymphs Abs 4.4 (*)    Eosinophils Absolute 1.1 (*)    All other components within normal limits  BASIC METABOLIC PANEL - Abnormal; Notable for the following components:   Potassium 3.3 (*)    Calcium 7.8 (*)    All other components within normal limits  BRAIN NATRIURETIC PEPTIDE  I-STAT TROPONIN, ED    EKG  EKG Interpretation  Date/Time:  Sunday April 27 2017 06:48:10 EST Ventricular Rate:  98 PR Interval:  126 QRS Duration: 84 QT Interval:  370 QTC Calculation: 472 R Axis:   26 Text Interpretation:  Normal sinus rhythm Anterior infarct , age undetermined Abnormal ECG similar to Mar 29 2017 Confirmed by Sherwood Gambler [phone]) on 04/27/2017 10:58:53 AM       Radiology Dg Chest 2 View  Result Date: 04/27/2017 CLINICAL DATA:  COPD. EXAM: CHEST  2 VIEW COMPARISON:  04/24/2017 FINDINGS: The heart size and mediastinal contours are within normal limits. Both lungs are clear. The visualized skeletal structures are unremarkable. IMPRESSION: No active cardiopulmonary disease. Electronically Signed   By: Kerby Moors M.D.   On: 04/27/2017 07:40    Procedures Procedures (including critical care time)  CRITICAL CARE--multiple nebulizer  tx Performed by: Reece Agar   Total critical care time: 45 minutes  Critical care time was exclusive of separately billable procedures and treating  other patients.  Critical care was necessary to treat or prevent imminent or life-threatening deterioration.  Critical care was time spent personally by me on the following activities: development of treatment plan with patient and/or surrogate as well as nursing, discussions with consultants, evaluation of patient's response to treatment, examination of patient, obtaining history from patient or surrogate, ordering and performing treatments and interventions, ordering and review of laboratory studies, ordering and review of radiographic studies, pulse oximetry and re-evaluation of patient's condition.   Medications Ordered in ED Medications  albuterol (PROVENTIL,VENTOLIN) solution continuous neb (10 mg/hr Nebulization New Bag/Given 04/27/17 1408)  albuterol (PROVENTIL) (2.5 MG/3ML) 0.083% nebulizer solution (  Given 04/27/17 0839)  albuterol (PROVENTIL) (2.5 MG/3ML) 0.083% nebulizer solution 5 mg (5 mg Nebulization Given 04/27/17 0700)  albuterol (PROVENTIL) (2.5 MG/3ML) 0.083% nebulizer solution 5 mg (5 mg Nebulization Given 04/27/17 1134)  ipratropium (ATROVENT) nebulizer solution 0.5 mg (0.5 mg Nebulization Given 04/27/17 1133)  magnesium sulfate IVPB 2 g 50 mL (0 g Intravenous Stopped 04/27/17 1356)  methylPREDNISolone sodium succinate (SOLU-MEDROL) 125 mg/2 mL injection 125 mg (125 mg Intravenous Given 04/27/17 1256)  ipratropium (ATROVENT) nebulizer solution 0.5 mg (0.5 mg Nebulization Given 04/27/17 1402)  potassium chloride SA (K-DUR,KLOR-CON) CR tablet 40 mEq (40 mEq Oral Given 04/27/17 1451)     Initial Impression / Assessment and Plan / ED Course  I have reviewed the triage vital signs and the nursing notes.  Pertinent labs & imaging results that were available during my care of the patient were reviewed by me and considered  in my medical decision making (see chart for details).     46 y.o. female here with SOB with exertion, dry cough, and wheezing x3 days. Was seen on 04/24/17 and received nebs and 8mg  decadron PO and improved so she went home. States yesterday symptoms worsened. On exam, diffuse expiratory wheezing throughout, somewhat poor air movement, no rhonchi/rales appreciated, no pedal edema or tachycardia, no hypoxia but speaking in short sentences. CXR today negative. EKG unchanged from prior. Pt has received 1 albuterol 5mg  nebs. Will give duoneb and Mg, solumedrol, and get labs. If she doesn't improve significantly, will consider CAT tx. If that fails, she may need to come into the hospital.   1:40 PM  Trop neg. Awaiting rest of labs. Pt's wheezing marginally better with duoneb and Mg, but still globally diminished and with faint wheezing, still slightly tachypneic and working a little hard to breathe. Will try CAT with 0.5mg  ipratropium (since her first albuterol tx in triage didn't have this mixed in), and reassess; if not significantly improved then will proceed with admission. Will reassess shortly. Discussed case with my attending Dr. Regenia Skeeter who agrees with plan.   2:43 PM CBC w/diff with leukocytosis 18.7 likely from steroid demargination, and with stable marginal anemia. BMP with marginally low K 3.3, likely from albuterol; will orally replete now. BNP WNL. Pt still receiving CAT; will reassess shortly.   3:48 PM Pt's lung sounds less diminished now with slightly better air movement, and pt not working as hard to breathe, however she's now having more wheezing throughout (likely because we finally got her bronchodilated enough to hear more). Given requirement of multiple nebs and CAT, without full resolution of wheezing, will proceed with admission.   4:04 PM Sharene Butters of [facility] returning page and will admit. Holding orders to be placed by admitting team. Please see their notes for further  documentation of care. I appreciate their help with this pleasant pt's care.  Pt stable at time of admission.    Final Clinical Impressions(s) / ED Diagnoses   Final diagnoses:  Severe persistent asthma with exacerbation  SOB (shortness of breath) on exertion  Cough  Wheezing  Hypokalemia    ED Discharge Orders    [address], [place], [place] 04/27/17 1604    Sherwood Gambler, MD 04/27/17 [phone]

## 2017-04-27 NOTE — H&P (Signed)
History and Physical    April [name] FXT:[id number] DOB: 06-14-1970 DOA: 04/27/2017   PCP: Arnoldo Morale, MD   Patient coming from:  Home    Chief Complaint: Shortness of breath and wheezing  HPI: April [name] is a 46 y.o. female with a history ofasthma, COPD, GERD, morbid obesity, hypertension, diabetes, with recent presentation to the ED on 04/24/2017 for similar complaints, treated with DuoNeb, and discharged home with oral Decadron, now presenting with another episode of asthma exacerbation.  She reports that after discharge, was feeling better the following day, but yesterday, her symptoms worsened.  The symptoms feel similar to her prior asthma attacks.  She has been trying to take albuterol inhaler and DuoNeb nebulizer at home with no relief.  She continues to smoke.  She denies any fever, chills, hemoptysis, leg swelling, recent travel or surgery, denies immobilization, estrogen use, or history of PE or DVT.  She denies any chest pain or palpitations.  She denies any abdominal pain, nausea or vomiting.  She denies any urinary complaints.  She denies any myalgia, arthralgia, or any unilateral weakness.  She reports that the cold weather seems to be exacerbating her symptoms.  Denies history of Cancer.  She has not seen her pulmonary doctors since at least April of this year.   ED Course:  BP 113/72 (BP Location: Right Arm)   Pulse 78   Temp 98.1 F (36.7 C) (Oral)   Resp 20   Ht 5\' 6"  (1.676 m)   Wt (!) 149.7 kg (330 lb)   LMP 03/29/2017 (LMP Unknown)   SpO2 98%   BMI 53.26 kg/m   Received albuterol nebulizer x4, 1 continues nebulizer at 2 PM, as well as Atrovent nebulizer at 2 PM Received Solu-Medrol 125 mg IV x1 at 1 PM Troponin was negative. CBC with leukocytosis, white count of 18.7 in the setting of steroids Potassium was 3.3, which was replenished. BNP was normal She received continuous nebulizer, with some improvement.  She also received magnesium, and ipratropium. After  bronchodilation, she was noted to have residual wheezing, but due to the requirement of multiple nebulizers and continuous nebulizer, without full resolution of wheezing, she is being admitted for observation and management of her symptoms.   Review of Systems:  As per HPI otherwise all other systems reviewed and are negative  Past Medical History:  Diagnosis Date  . Acanthosis nigricans   . Arthritis   . Asthma    Followed by Dr. Annamaria Boots (pulmonology); receives every other week omalizumab injections; has frequent exacerbations  . COPD (chronic obstructive pulmonary disease) ([place])    PFTs in 2002, FEV1/FVC 65, no post bronchodilater test done  . Depression   . GERD (gastroesophageal reflux disease)   . GERD (gastroesophageal reflux disease)   . Headache(784.0)   . Helicobacter pylori (H. pylori) infection   . Hypertension, essential   . Insomnia   . Menorrhagia   . Morbid obesity ([place])   . Obesity   . Seasonal allergies   . Shortness of breath   . Sleep apnea    Sleep study 2008 - mild OSA, not enough events to titrate CPAP  . Tobacco user     Past Surgical History:  Procedure Laterality Date  . BREAST REDUCTION SURGERY  09/2011  . TUBAL LIGATION  1996   bilateral    Social History Social History   Socioeconomic History  . Marital status: Single    Spouse name: Not on file  . Number of  children: 1  . Years of education: Not on file  . Highest education level: Not on file  Social Needs  . Financial resource strain: Not on file  . Food insecurity - worry: Not on file  . Food insecurity - inability: Not on file  . Transportation needs - medical: Not on file  . Transportation needs - non-medical: Not on file  Occupational History  . Occupation: CNA  Tobacco Use  . Smoking status: Former Smoker    Packs/day: 0.50    Years: 18.00    Pack years: 9.00    Types: Cigarettes    Last attempt to quit: 04/29/2014    Years since quitting: 2.9  . Smokeless tobacco: Never  Used  Substance and Sexual Activity  . Alcohol use: No    Alcohol/week: 0.0 oz  . Drug use: No  . Sexual activity: Not on file  Other Topics Concern  . 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     No Known Allergies  Family History  Problem Relation Age of Onset  . Hypertension Mother   . Asthma Daughter   . Cancer Paternal Aunt   . Asthma Maternal Grandmother       Prior to Admission medications   Medication Sig Start Date End Date Taking? Authorizing Provider  albuterol (PROVENTIL HFA;VENTOLIN HFA) 108 (90 Base) MCG/ACT inhaler Inhale 2 puffs every 4 (four) hours as needed into the lungs for wheezing or shortness of breath. 03/12/17  Yes Arnoldo Morale, MD  amLODipine (NORVASC) 10 MG tablet Take 1 tablet (10 mg total) daily by mouth. 03/12/17  Yes Amao, Enobong, MD  arformoterol (BROVANA) 15 MCG/2ML NEBU Take 2 mLs (15 mcg total) 2 (two) times daily by nebulization. 03/12/17  Yes Amao, Charlane Ferretti, MD  budesonide (PULMICORT) 0.25 MG/2ML nebulizer solution Take 2 mLs (0.25 mg total) 2 (two) times daily by nebulization. 03/12/17  Yes Arnoldo Morale, MD  busPIRone (BUSPAR) 7.5 MG tablet Take 1 tablet (7.5 mg total) 2 (two) times daily by mouth. 03/12/17  Yes Arnoldo Morale, MD  butalbital-acetaminophen-caffeine (FIORICET, ESGIC) 50-325-40 MG tablet Take 1-2 tablets by mouth every 6 (six) hours as needed for headache. 12/19/16 12/19/17 Yes Joy, Shawn C, PA-C  famotidine (PEPCID) 20 MG tablet Take 1 tablet (20 mg total) by mouth 2 (two) times daily. 03/29/17  Yes Ward, Ozella Almond, PA-C  fluticasone (FLONASE) 50 MCG/ACT nasal spray Place 2 sprays into both nostrils daily. 12/13/16  Yes Rai, Ripudeep K, MD  furosemide (LASIX) 40 MG tablet Take 1 tablet (40 mg total) daily by mouth. 03/12/17 03/12/18 Yes Amao, Enobong, MD  loratadine (CLARITIN) 10 MG tablet Take 1 tablet (10 mg total) by mouth daily. 08/30/15  Yes [name], MD  losartan  (COZAAR) 100 MG tablet Take 1 tablet (100 mg total) daily by mouth. 03/12/17  Yes Amao, Charlane Ferretti, MD  metoprolol tartrate (LOPRESSOR) 100 MG tablet Take 1 tablet (100 mg total) by mouth 2 (two) times daily. 02/05/17  Yes Amao, Charlane Ferretti, MD  mometasone-formoterol (DULERA) 200-5 MCG/ACT AERO Inhale 2 puffs into the lungs 2 (two) times daily. 03/29/17  Yes Ward, Ozella Almond, PA-C  montelukast (SINGULAIR) 10 MG tablet Take 1 tablet (10 mg total) at bedtime by mouth. 03/12/17  Yes Amao, Enobong, MD  nicotine (NICODERM CQ - DOSED IN MG/24 HOURS) 14 mg/24hr patch Place 1 patch (14 mg total) onto the skin daily. 10/04/16  Yes Reyne Dumas, MD  omeprazole (PRILOSEC)  20 MG capsule Take 1 capsule (20 mg total) by mouth daily. Patient taking differently: Take 20 mg by mouth 2 (two) times daily.  10/09/16  Yes Baird Lyons D, MD  sertraline (ZOLOFT) 100 MG tablet Take 1 tablet (100 mg total) daily by mouth. 03/12/17  Yes Amao, Charlane Ferretti, MD  spironolactone (ALDACTONE) 100 MG tablet Take 1 tablet (100 mg total) daily by mouth. 03/12/17  Yes Amao, Enobong, MD  zolpidem (AMBIEN) 10 MG tablet Take 10 mg by mouth at bedtime. 09/27/10  Yes [provider]  diclofenac (VOLTAREN) 75 MG EC tablet Take 1 tablet (75 mg total) 2 (two) times daily by mouth. Patient not taking: Reported on 04/27/2017 03/12/17   Arnoldo Morale, MD  guaiFENesin (MUCINEX) 600 MG 12 hr tablet Take 1 tablet (600 mg total) by mouth 2 (two) times daily. Patient not taking: Reported on 04/27/2017 12/13/16   Rai, Vernelle Emerald, MD  ondansetron (ZOFRAN ODT) 4 MG disintegrating tablet Take 1 tablet (4 mg total) by mouth every 8 (eight) hours as needed for nausea or vomiting. Patient not taking: Reported on 04/24/2017 03/29/17   Ward, Ozella Almond, PA-C  potassium chloride (K-DUR) 10 MEQ tablet Take 1 tablet (10 mEq total) by mouth daily for 7 days. Patient not taking: Reported on 04/24/2017 03/29/17 04/24/17  Ward, Ozella Almond, PA-C    Physical  Exam:  Vitals:   04/27/17 1120 04/27/17 1200 04/27/17 1334 04/27/17 1408  BP: 114/60 (!) 120/91 113/72   Pulse: 88 91 78   Resp: (!) 21  20   Temp:      TempSrc:      SpO2: 99% 97% 98% 98%  Weight:      Height:       Constitutional: NAD, but uncomfortable due to wheezing, she also has dry cough. Eyes: PERRL, lids and conjunctivae normal ENMT: Mucous membranes are moist, without exudate or lesions  Neck: normal, supple, no masses, no thyromegaly Respiratory:  decreased breath sounds at bases, wheezing, no rhonchi or rales. No significant WOB   Cardiovascular: Regular rate and rhythm,  murmur, rubs or gallops. No extremity edema. 2+ pedal pulses. No carotid bruits.  Abdomen: Soft, morbidly obese non tender, No hepatosplenomegaly. Bowel sounds positive.  Musculoskeletal: no clubbing / cyanosis. Moves all extremities Skin: no jaundice, No lesions.  Neurologic: Sensation intact  Strength equal in all extremities Psychiatric:   Alert and oriented x 3.  Somewhat anxious man    Labs on Admission: I have personally reviewed following labs and imaging studies  CBC: Recent Labs  Lab 04/27/17 1253  WBC 18.7*  NEUTROABS 12.1*  HGB 11.8*  HCT 36.5  MCV 87.1  PLT 211    Basic Metabolic Panel: Recent Labs  Lab 04/27/17 1253  NA 135  K 3.3*  CL 101  CO2 26  GLUCOSE 93  BUN 7  CREATININE 0.82  CALCIUM 7.8*    GFR: Estimated Creatinine Clearance: 132 mL/min (by C-G formula based on SCr of 0.82 mg/dL).  Liver Function Tests: No results for input(s): AST, ALT, ALKPHOS, BILITOT, PROT, ALBUMIN in the last 168 hours. No results for input(s): LIPASE, AMYLASE in the last 168 hours. No results for input(s): AMMONIA in the last 168 hours.  Coagulation Profile: No results for input(s): INR, PROTIME in the last 168 hours.  Cardiac Enzymes: No results for input(s): CKTOTAL, CKMB, CKMBINDEX, TROPONINI in the last 168 hours.  BNP (last 3 results) No results for input(s): PROBNP  in the last 8760 hours.  HbA1C:  No results for input(s): HGBA1C in the last 72 hours.  CBG: No results for input(s): GLUCAP in the last 168 hours.  Lipid Profile: No results for input(s): CHOL, HDL, LDLCALC, TRIG, CHOLHDL, LDLDIRECT in the last 72 hours.  Thyroid Function Tests: No results for input(s): TSH, T4TOTAL, FREET4, T3FREE, THYROIDAB in the last 72 hours.  Anemia Panel: No results for input(s): VITAMINB12, FOLATE, FERRITIN, TIBC, IRON, RETICCTPCT in the last 72 hours.  Urine analysis:    Component Value Date/Time   COLORURINE AMBER (A) 03/29/2017 1708   APPEARANCEUR CLOUDY (A) 03/29/2017 1708   LABSPEC 1.020 03/29/2017 1708   PHURINE 5.0 03/29/2017 1708   GLUCOSEU NEGATIVE 03/29/2017 1708   GLUCOSEU NEG mg/dL 10/28/2007 2049   HGBUR NEGATIVE 03/29/2017 1708   BILIRUBINUR MODERATE (A) 03/29/2017 1708   KETONESUR NEGATIVE 03/29/2017 1708   PROTEINUR 30 (A) 03/29/2017 1708   UROBILINOGEN 1.0 11/21/2014 0707   NITRITE NEGATIVE 03/29/2017 1708   LEUKOCYTESUR NEGATIVE 03/29/2017 1708    Sepsis Labs: @LABRCNTIP (procalcitonin:4,lacticidven:4) )No results found for this or any previous visit (from the past 240 hour(s)).   Radiological Exams on Admission: Dg Chest 2 View  Result Date: 04/27/2017 CLINICAL DATA:  COPD. EXAM: CHEST  2 VIEW COMPARISON:  04/24/2017 FINDINGS: The heart size and mediastinal contours are within normal limits. Both lungs are clear. The visualized skeletal structures are unremarkable. IMPRESSION: No active cardiopulmonary disease. Electronically Signed   By: Kerby Moors M.D.   On: 04/27/2017 07:40    EKG: Independently reviewed.  Assessment/Plan Active Problems:   Moderate persistent asthma with exacerbation   Asthma exacerbation   Morbid obesity with body mass index of 50.0-59.9 in adult [facility])   Essential hypertension   Hypokalemia   Primary insomnia   Tobacco abuse   Leukocytosis   Prediabetes   GERD (gastroesophageal reflux  disease)   Anxiety and depression   Normocytic anemia   OSA (obstructive sleep apnea)   Moderate persistent asthma with acute exacerbation, with frequent admissions for the same.  She reports cough and cold weather is a trigger.  He was last seen at the ED on 1227, at discharge on prednisone, returning with increasing shortness of breath and wheezing.  Received Solu-Medrol 125 mg x1, and several rounds of albuterol, with eventual 1 round of continuous albuterol, with some improvement.  She also received ipratropium with some response.  However, her wheezing did not resolve, for which she is being admitted.  The patient sees Dr. Annamaria Boots, but per chart review, there is concern with possible noncompliant with appointments and medications.  She also continues to smoke.  Chest x-ray NAD.  White count 18.7 in the setting of chronic steroids. Afebrile  Admit to MedSurg Scheduled ipratropium every 4 hours, with  albuterol every 2 hours as needed Continue Solu-Medrol 60 mg IV twice daily Mucinex twice daily as needed Continue Pulmicort in a.m. Continue Flonase and Singulair Patient needs to have a follow-up with pulmonologist after discharge. Nicotine patch daily CBC in am   OSA Continue CPAP nightly  Chronic Diastolic CHF: No acute decompensation weight 330 lbs, BNP normal. Last Echo 09/2016  EF55- 60 % Gr 1 DD Continue meds with Lopressor, diuretics, losartan, spironolactone, Norvasc Obtain daily weights Monitor intake and output   Hypertension BP 113/72 Pulse 78    Continue home anti-hypertensive medications including Norvasc, Lopressor, Aldactone, Cozaar Add Hydralazine Q6 hours as needed for BP 160/90    Diabetes, Glucose 93, most recent A1c is 6.1 on October 9 Sliding scale  insulin in view of the patient needing steroids Continue to monitor  Hypokalemia, likely due to recent use of diuretics with Lasix.  Potassium was 3.3, repleted in the ED To monitor with BMET in am   Anxiety and  depression Continue home Zoloft, BuSpar  Insomnia Continue Ambien  GERD, no acute symptoms Continue Pepcid     DVT prophylaxis: Lovenox Code Status:    Full code Family Communication:  Discussed with patient Disposition Plan: Expect patient to be discharged to home after condition improves Consults called:    None Admission status: MedSurg observation   Sharene Butters, PA-C [facility]   04/27/2017, 4:12 PM

## 2017-04-27 NOTE — ED Notes (Signed)
IP nurse will call this RN back for report after med pass

## 2017-04-27 NOTE — Progress Notes (Signed)
Patient arrived to 6N14 A&Ox4, VSS, IV intact.  Patient is ambulatory and denies pain at this time.  No family at bedside.  Patient oriented to room and equipment.

## 2017-04-27 NOTE — Progress Notes (Signed)
Patient upset about carb mod diet ordered.  Demands nurse call doctor to have changed.  Nurse explained that due to steroid treatments in ER her blood sugar was a bit high and the reason for that particular diet is to keep her blood sugar from going higher.  Patient states if diet not changed she will leave AMA.  MD notified.

## 2017-04-27 NOTE — ED Notes (Signed)
RT notified of continuous neb needed

## 2017-04-27 NOTE — ED Notes (Signed)
Attempted to draw labs from pt. Pt refused lab draw from this ed tech at this time.

## 2017-04-28 ENCOUNTER — Encounter (HOSPITAL_COMMUNITY): Payer: Self-pay | Admitting: General Practice

## 2017-04-28 ENCOUNTER — Observation Stay (HOSPITAL_COMMUNITY): Payer: Medicaid Other

## 2017-04-28 DIAGNOSIS — R062 Wheezing: Secondary | ICD-10-CM | POA: Diagnosis present

## 2017-04-28 DIAGNOSIS — F5101 Primary insomnia: Secondary | ICD-10-CM | POA: Diagnosis present

## 2017-04-28 DIAGNOSIS — I110 Hypertensive heart disease with heart failure: Secondary | ICD-10-CM | POA: Diagnosis present

## 2017-04-28 DIAGNOSIS — J4541 Moderate persistent asthma with (acute) exacerbation: Secondary | ICD-10-CM | POA: Diagnosis not present

## 2017-04-28 DIAGNOSIS — Z7952 Long term (current) use of systemic steroids: Secondary | ICD-10-CM | POA: Diagnosis not present

## 2017-04-28 DIAGNOSIS — E119 Type 2 diabetes mellitus without complications: Secondary | ICD-10-CM | POA: Diagnosis present

## 2017-04-28 DIAGNOSIS — I5032 Chronic diastolic (congestive) heart failure: Secondary | ICD-10-CM | POA: Diagnosis present

## 2017-04-28 DIAGNOSIS — D72828 Other elevated white blood cell count: Secondary | ICD-10-CM | POA: Diagnosis present

## 2017-04-28 DIAGNOSIS — I10 Essential (primary) hypertension: Secondary | ICD-10-CM | POA: Diagnosis not present

## 2017-04-28 DIAGNOSIS — F329 Major depressive disorder, single episode, unspecified: Secondary | ICD-10-CM | POA: Diagnosis present

## 2017-04-28 DIAGNOSIS — J189 Pneumonia, unspecified organism: Secondary | ICD-10-CM | POA: Diagnosis present

## 2017-04-28 DIAGNOSIS — F17200 Nicotine dependence, unspecified, uncomplicated: Secondary | ICD-10-CM | POA: Diagnosis present

## 2017-04-28 DIAGNOSIS — K219 Gastro-esophageal reflux disease without esophagitis: Secondary | ICD-10-CM | POA: Diagnosis present

## 2017-04-28 DIAGNOSIS — F419 Anxiety disorder, unspecified: Secondary | ICD-10-CM | POA: Diagnosis present

## 2017-04-28 DIAGNOSIS — J4551 Severe persistent asthma with (acute) exacerbation: Secondary | ICD-10-CM | POA: Diagnosis not present

## 2017-04-28 DIAGNOSIS — G4700 Insomnia, unspecified: Secondary | ICD-10-CM | POA: Diagnosis present

## 2017-04-28 DIAGNOSIS — Z720 Tobacco use: Secondary | ICD-10-CM | POA: Diagnosis not present

## 2017-04-28 DIAGNOSIS — D649 Anemia, unspecified: Secondary | ICD-10-CM | POA: Diagnosis present

## 2017-04-28 DIAGNOSIS — J440 Chronic obstructive pulmonary disease with acute lower respiratory infection: Secondary | ICD-10-CM | POA: Diagnosis present

## 2017-04-28 DIAGNOSIS — E876 Hypokalemia: Secondary | ICD-10-CM | POA: Diagnosis present

## 2017-04-28 DIAGNOSIS — Z6841 Body Mass Index (BMI) 40.0 and over, adult: Secondary | ICD-10-CM | POA: Diagnosis not present

## 2017-04-28 DIAGNOSIS — Z765 Malingerer [conscious simulation]: Secondary | ICD-10-CM | POA: Diagnosis not present

## 2017-04-28 DIAGNOSIS — Z79899 Other long term (current) drug therapy: Secondary | ICD-10-CM | POA: Diagnosis not present

## 2017-04-28 DIAGNOSIS — T380X5A Adverse effect of glucocorticoids and synthetic analogues, initial encounter: Secondary | ICD-10-CM | POA: Diagnosis present

## 2017-04-28 DIAGNOSIS — R0600 Dyspnea, unspecified: Secondary | ICD-10-CM | POA: Diagnosis not present

## 2017-04-28 DIAGNOSIS — G4733 Obstructive sleep apnea (adult) (pediatric): Secondary | ICD-10-CM | POA: Diagnosis present

## 2017-04-28 LAB — COMPREHENSIVE METABOLIC PANEL
ALBUMIN: 3.1 g/dL — AB (ref 3.5–5.0)
ALT: 16 U/L (ref 14–54)
AST: 21 U/L (ref 15–41)
Alkaline Phosphatase: 72 U/L (ref 38–126)
Anion gap: 8 (ref 5–15)
BUN: 10 mg/dL (ref 6–20)
CHLORIDE: 103 mmol/L (ref 101–111)
CO2: 23 mmol/L (ref 22–32)
Calcium: 8.4 mg/dL — ABNORMAL LOW (ref 8.9–10.3)
Creatinine, Ser: 0.8 mg/dL (ref 0.44–1.00)
GFR calc Af Amer: >60 mL/min (ref >60)
GFR calc non Af Amer: >60 mL/min (ref >60)
GLUCOSE: 183 mg/dL — AB (ref 65–99)
POTASSIUM: 4.4 mmol/L (ref 3.5–5.1)
Sodium: 134 mmol/L — ABNORMAL LOW (ref 135–145)
Total Bilirubin: 0.5 mg/dL (ref 0.3–1.2)
Total Protein: 6.1 g/dL — ABNORMAL LOW (ref 6.5–8.1)

## 2017-04-28 LAB — CBC
HEMATOCRIT: 39.1 % (ref 36.0–46.0)
HEMOGLOBIN: 12.8 g/dL (ref 12.0–15.0)
MCH: 28 pg (ref 26.0–34.0)
MCHC: 32.7 g/dL (ref 30.0–36.0)
MCV: 85.6 fL (ref 78.0–100.0)
Platelets: 316 10*3/uL (ref 150–400)
RBC: 4.57 MIL/uL (ref 3.87–5.11)
RDW: 16.6 % — AB (ref 11.5–15.5)
WBC: 16.8 10*3/uL — AB (ref 4.0–10.5)

## 2017-04-28 LAB — MRSA PCR SCREENING: MRSA by PCR: NEGATIVE

## 2017-04-28 LAB — GLUCOSE, CAPILLARY
GLUCOSE-CAPILLARY: 169 mg/dL — AB (ref 65–99)
GLUCOSE-CAPILLARY: 155 mg/dL — AB (ref 65–99)
Glucose-Capillary: 149 mg/dL — ABNORMAL HIGH (ref 65–99)
Glucose-Capillary: 131 mg/dL — ABNORMAL HIGH (ref 65–99)

## 2017-04-28 MED ORDER — METHYLPREDNISOLONE SODIUM SUCC 40 MG IJ SOLR
40.0000 mg | Freq: Four times a day (QID) | INTRAMUSCULAR | Status: DC
  Administered 2017-04-28 – 2017-04-30 (×8): 40 mg via INTRAVENOUS
  Filled 2017-04-28 (×8): qty 1

## 2017-04-28 MED ORDER — DOXYCYCLINE HYCLATE 100 MG PO TABS
100.0000 mg | ORAL_TABLET | Freq: Two times a day (BID) | ORAL | Status: DC
  Administered 2017-04-28 – 2017-04-30 (×5): 100 mg via ORAL
  Filled 2017-04-28 (×5): qty 1

## 2017-04-28 NOTE — Progress Notes (Signed)
PROGRESS NOTE    April [name]  HDQ:[id number] DOB: 1971-02-03 DOA: 04/27/2017 PCP: Arnoldo Morale, MD   Outpatient Specialists:    Brief Narrative:  April [name] is a 46 y.o. female with a history ofasthma, COPD, GERD, morbid obesity, hypertension, diabetes, with recent presentation to the ED on 04/24/2017 for similar complaints, treated with DuoNeb, and discharged home with oral Decadron, now presenting with another episode of asthma exacerbation.  She reports that after discharge, was feeling better the following day, but yesterday, her symptoms worsened.  The symptoms feel similar to her prior asthma attacks.  She has been trying to take albuterol inhaler and DuoNeb nebulizer at home with no relief.  She continues to smoke.  She denies any fever, chills, hemoptysis, leg swelling, recent travel or surgery, denies immobilization, estrogen use, or history of PE or DVT.  She denies any chest pain or palpitations.  She denies any abdominal pain, nausea or vomiting.  She denies any urinary complaints.  She denies any myalgia, arthralgia, or any unilateral weakness.  She reports that the cold weather seems to be exacerbating her symptoms.  Denies history of Cancer.  She has not seen her pulmonary doctors since at least April of this year.     Assessment & Plan:   Active Problems:   Morbid obesity with body mass index of 50.0-59.9 in adult [facility])   Essential hypertension   Hypokalemia   Primary insomnia   Tobacco abuse   Leukocytosis   Prediabetes   GERD (gastroesophageal reflux disease)   Anxiety and depression   Normocytic anemia   Moderate persistent asthma with exacerbation   OSA (obstructive sleep apnea)   Asthma exacerbation   Moderate persistent asthma with acute exacerbation, with frequent admissions for the same.   -last seen at the ED on 12/27, at discharge on prednisone, returning with increasing shortness of breath and wheezing.  Received Solu-Medrol 125 mg x1, and several  rounds of albuterol, with eventual 1 round of continuous albuterol, with some improvement.  She also received ipratropium with some response.  However, her wheezing did not resolve, for which she is being admitted.  The patient sees Dr. Annamaria Boots, but per chart review, there is concern with possible noncompliant with appointments and medications.  She also continues to smoke.   -today's chest x ray suggestive of PNA so will add doxy -still wheezing so will increase steroids -Mucinex twice daily as needed -nebs Patient needs to have a follow-up with pulmonologist after discharge. Nicotine patch daily   OSA Continue CPAP nightly  Chronic Diastolic CHF: No acute decompensation weight 330 lbs, BNP normal. Last Echo 09/2016  EF55- 60 % Gr 1 DD Continue meds with Lopressor, diuretics, losartan, spironolactone, Norvasc Obtain daily weights Monitor intake and output   Hypertension stable  Diabetes -diabetic diet -SSI  Hypokalemia, likely due to recent use of diuretics with Lasix.  Potassium was 3.3, repleted in the ED  Anxiety and depression Continue home Zoloft, BuSpar  Insomnia Continue Ambien  GERD, no acute symptoms Continue Pepcid      DVT prophylaxis:  Lovenox   Code Status: Full Code   Family Communication:   Disposition Plan:     Consultants:        Subjective: C/o diet-- wants a regular diet  Objective: Vitals:   04/28/17 0933 04/28/17 0934 04/28/17 1126 04/28/17 1425  BP:    (!) 156/78  Pulse:    68  Resp:    16  Temp:  98.3 F (36.8 C)  TempSrc:    Oral  SpO2: 100% 100% 98% 98%  Weight:      Height:        Intake/Output Summary (Last 24 hours) at 04/28/2017 1511 Last data filed at 04/28/2017 1761 Gross per 24 hour  Intake 240 ml  Output -  Net 240 ml   Filed Weights   04/27/17 6073 04/27/17 1740  Weight: (!) 149.7 kg (330 lb) (!) 150.2 kg (331 lb 2.1 oz)    Examination:  General exam: obese female Respiratory  system: not moving much air, expiratory wheeze Cardiovascular system: S1 & S2 heard, RRR. No JVD, murmurs, rubs, gallops or clicks. No pedal edema. Gastrointestinal system: Abdomen is nondistended, soft and nontender. No organomegaly or masses felt. Normal bowel sounds heard. Central nervous system: Alert and oriented. No focal neurological deficits. Extremities: Symmetric 5 x 5 power. Skin: No rashes, lesions or ulcers Psychiatry: Judgement and insight appear normal. Mood & affect appropriate.     Data Reviewed: I have personally reviewed following labs and imaging studies  CBC: Recent Labs  Lab 04/27/17 1253 04/28/17 0429  WBC 18.7* 16.8*  NEUTROABS 12.1*  --   HGB 11.8* 12.8  HCT 36.5 39.1  MCV 87.1 85.6  PLT 338 710   Basic Metabolic Panel: Recent Labs  Lab 04/27/17 1253 04/28/17 0911  NA 135 134*  K 3.3* 4.4  CL 101 103  CO2 26 23  GLUCOSE 93 183*  BUN 7 10  CREATININE 0.82 0.80  CALCIUM 7.8* 8.4*   GFR: Estimated Creatinine Clearance: 135.6 mL/min (by C-G formula based on SCr of 0.8 mg/dL). Liver Function Tests: Recent Labs  Lab 04/28/17 0911  AST 21  ALT 16  ALKPHOS 72  BILITOT 0.5  PROT 6.1*  ALBUMIN 3.1*   No results for input(s): LIPASE, AMYLASE in the last 168 hours. No results for input(s): AMMONIA in the last 168 hours. Coagulation Profile: No results for input(s): INR, PROTIME in the last 168 hours. Cardiac Enzymes: No results for input(s): CKTOTAL, CKMB, CKMBINDEX, TROPONINI in the last 168 hours. BNP (last 3 results) No results for input(s): PROBNP in the last 8760 hours. HbA1C: No results for input(s): HGBA1C in the last 72 hours. CBG: Recent Labs  Lab 04/27/17 1704 04/27/17 2127 04/28/17 0745 04/28/17 1212  GLUCAP 200* 205* 149* 131*   Lipid Profile: No results for input(s): CHOL, HDL, LDLCALC, TRIG, CHOLHDL, LDLDIRECT in the last 72 hours. Thyroid Function Tests: No results for input(s): TSH, T4TOTAL, FREET4, T3FREE,  THYROIDAB in the last 72 hours. Anemia Panel: No results for input(s): VITAMINB12, FOLATE, FERRITIN, TIBC, IRON, RETICCTPCT in the last 72 hours. Urine analysis:    Component Value Date/Time   COLORURINE AMBER (A) 03/29/2017 1708   APPEARANCEUR CLOUDY (A) 03/29/2017 1708   LABSPEC 1.020 03/29/2017 1708   PHURINE 5.0 03/29/2017 1708   GLUCOSEU NEGATIVE 03/29/2017 1708   GLUCOSEU NEG mg/dL 10/28/2007 2049   HGBUR NEGATIVE 03/29/2017 1708   BILIRUBINUR MODERATE (A) 03/29/2017 1708   KETONESUR NEGATIVE 03/29/2017 1708   PROTEINUR 30 (A) 03/29/2017 1708   UROBILINOGEN 1.0 11/21/2014 0707   NITRITE NEGATIVE 03/29/2017 1708   LEUKOCYTESUR NEGATIVE 03/29/2017 1708     )No results found for this or any previous visit (from the past 240 hour(s)).    Anti-infectives (From admission, onward)   Start     Dose/Rate Route Frequency Ordered Stop   04/28/17 1330  doxycycline (VIBRA-TABS) tablet 100 mg     100  mg Oral Every 12 hours 04/28/17 1315         Radiology Studies: X-ray Chest Pa And Lateral  Result Date: 04/28/2017 CLINICAL DATA:  Asthma exacerbation for the past week with onset of shortness of breath today. EXAM: CHEST  2 VIEW COMPARISON:  PA and lateral chest x-ray of April 27, 2017 FINDINGS: The lungs are adequately inflated. There is new increased density lateral to the left heart border. There is no pleural effusion or pneumothorax. The heart and pulmonary vascularity are normal. The mediastinum is normal in width. The bony thorax is unremarkable. IMPRESSION: New patchy increased density at the left lung base likely posteriorly consistent with atelectasis or early pneumonia. Followup PA and lateral chest X-ray is recommended in 3-4 weeks following trial of antibiotic therapy to ensure resolution and exclude underlying malignancy. Electronically Signed   By: David  Martinique M.D.   On: 04/28/2017 08:32   Dg Chest 2 View  Result Date: 04/27/2017 CLINICAL DATA:  COPD. EXAM: CHEST   2 VIEW COMPARISON:  04/24/2017 FINDINGS: The heart size and mediastinal contours are within normal limits. Both lungs are clear. The visualized skeletal structures are unremarkable. IMPRESSION: No active cardiopulmonary disease. Electronically Signed   By: Kerby Moors M.D.   On: 04/27/2017 07:40        Scheduled Meds: . amLODipine  10 mg Oral Daily  . budesonide  0.25 mg Nebulization BID  . busPIRone  7.5 mg Oral BID  . doxycycline  100 mg Oral Q12H  . enoxaparin (LOVENOX) injection  75 mg Subcutaneous Q24H  . famotidine  20 mg Oral BID  . fluticasone  2 spray Each Nare Daily  . insulin aspart  0-9 Units Subcutaneous TID WC  . ipratropium-albuterol  3 mL Nebulization Q4H  . losartan  100 mg Oral Daily  . methylPREDNISolone (SOLU-MEDROL) injection  40 mg Intravenous Q6H  . metoprolol tartrate  100 mg Oral BID  . montelukast  10 mg Oral QHS  . nicotine  14 mg Transdermal Daily  . sertraline  100 mg Oral Daily  . spironolactone  100 mg Oral Daily  . zolpidem  10 mg Oral QHS   Continuous Infusions:   LOS: 0 days    Time spent: 35 min    Geradine Girt, DO [facility] Pager [phone]  If 7PM-7AM, please contact night-coverage [email or web address] Password [email or web address] 04/28/2017, 3:11 PM

## 2017-04-28 NOTE — Progress Notes (Signed)
CPAP set up and patient placed on auto mode (20/4) cm H20, via FFM. Tolerating well at this time.

## 2017-04-29 DIAGNOSIS — Z720 Tobacco use: Secondary | ICD-10-CM

## 2017-04-29 DIAGNOSIS — J80 Acute respiratory distress syndrome: Secondary | ICD-10-CM

## 2017-04-29 HISTORY — DX: Acute respiratory distress syndrome: J80

## 2017-04-29 HISTORY — PX: EXTRACORPOREAL CIRCULATION: SHX266

## 2017-04-29 LAB — URINALYSIS, ROUTINE W REFLEX MICROSCOPIC
Bilirubin Urine: NEGATIVE
GLUCOSE, UA: NEGATIVE mg/dL
Hgb urine dipstick: NEGATIVE
KETONES UR: NEGATIVE mg/dL
LEUKOCYTES UA: NEGATIVE
NITRITE: NEGATIVE
PROTEIN: NEGATIVE mg/dL
Specific Gravity, Urine: 1.008 (ref 1.005–1.030)
pH: 6 (ref 5.0–8.0)

## 2017-04-29 LAB — GLUCOSE, CAPILLARY
GLUCOSE-CAPILLARY: 161 mg/dL — AB (ref 65–99)
GLUCOSE-CAPILLARY: 157 mg/dL — AB (ref 65–99)
Glucose-Capillary: 165 mg/dL — ABNORMAL HIGH (ref 65–99)
Glucose-Capillary: 147 mg/dL — ABNORMAL HIGH (ref 65–99)

## 2017-04-29 LAB — HEMOGLOBIN A1C
Hgb A1c MFr Bld: 5.6 % (ref 4.8–5.6)
Mean Plasma Glucose: 114.02 mg/dL

## 2017-04-29 NOTE — Progress Notes (Signed)
Pt stated that she may wear her CPAP tonight. Pt stated that she does not need help placing it on. Pt was encouraged to use the machine and if help is needed to inform staff to notify RT. Pt is stable at this time on RA receiving her scheduled nebs.

## 2017-04-29 NOTE — Progress Notes (Signed)
PROGRESS NOTE    April [name]  VWU:[id number] DOB: 1971-01-22 DOA: 04/27/2017 PCP: Arnoldo Morale, MD   Outpatient Specialists:    Brief Narrative:  April [name] is a 47 y.o. female with a history ofasthma, COPD, GERD, morbid obesity, hypertension, diabetes, with recent presentation to the ED on 04/24/2017 for similar complaints, treated with DuoNeb, and discharged home with oral Decadron, now presenting with another episode of asthma exacerbation.  She reports that after discharge, was feeling better the following day, but yesterday, her symptoms worsened.  The symptoms feel similar to her prior asthma attacks.  She has been trying to take albuterol inhaler and DuoNeb nebulizer at home with no relief.  She continues to smoke.  She denies any fever, chills, hemoptysis, leg swelling, recent travel or surgery, denies immobilization, estrogen use, or history of PE or DVT.  She denies any chest pain or palpitations.  She denies any abdominal pain, nausea or vomiting.  She denies any urinary complaints.  She denies any myalgia, arthralgia, or any unilateral weakness.  She reports that the cold weather seems to be exacerbating her symptoms.  Denies history of Cancer.  She has not seen her pulmonary doctors since at least April of this year.     Assessment & Plan:   Active Problems:   Morbid obesity with body mass index of 50.0-59.9 in adult [facility])   Essential hypertension   Hypokalemia   Primary insomnia   Tobacco abuse   Leukocytosis   Prediabetes   GERD (gastroesophageal reflux disease)   Anxiety and depression   Normocytic anemia   Moderate persistent asthma with exacerbation   OSA (obstructive sleep apnea)   Asthma exacerbation   Moderate persistent asthma with acute exacerbation, with frequent admissions for the same.   -last seen at the ED on 12/27, at discharge on prednisone, returning with increasing shortness of breath and wheezing.  Received Solu-Medrol 125 mg x1, and several  rounds of albuterol, with eventual 1 round of continuous albuterol, with some improvement.  She also received ipratropium with some response.  However, her wheezing did not resolve, for which she is being admitted.  The patient sees Dr. Annamaria Boots, but per chart review, there is concern with possible noncompliant with appointments and medications.  She also continues to smoke.   -today's chest x ray suggestive of PNA so will add doxy -continue IV Steroids -Mucinex twice daily as needed -nebs Patient needs to have a follow-up with pulmonologist after discharge. Nicotine patch daily   OSA Continue CPAP nightly  Chronic Diastolic CHF:  Gr 1 DD Continue meds with Lopressor, diuretics, losartan, spironolactone, Norvasc Obtain daily weights Monitor intake and output   Hypertension stable  Diabetes -diabetic diet -SSI  Hypokalemia, likely due to recent use of diuretics with Lasix.  Potassium was 3.3, repleted in the ED  Anxiety and depression Continue home Zoloft, BuSpar  Insomnia Continue Ambien  GERD, no acute symptoms Continue Pepcid   Morbid obesity Body mass index is 52.77 kg/m.    DVT prophylaxis:  Lovenox   Code Status: Full Code   Family Communication:   Disposition Plan:     Consultants:        Subjective: Still does not feel back to her baseline yet  Objective: Vitals:   04/28/17 2023 04/29/17 0032 04/29/17 0415 04/29/17 0514  BP: 135/82   138/71  Pulse: 66   63  Resp: 18   17  Temp: (!) 97.5 F (36.4 C)   98.2 F (36.8  C)  TempSrc: Oral     SpO2: 100% 98% 98% 98%  Weight:    (!) 148.3 kg (326 lb 15.1 oz)  Height:        Intake/Output Summary (Last 24 hours) at 04/29/2017 0919 Last data filed at 04/28/2017 2024 Gross per 24 hour  Intake 600 ml  Output -  Net 600 ml   Filed Weights   04/27/17 2440 04/27/17 1740 04/29/17 0514  Weight: (!) 149.7 kg (330 lb) (!) 150.2 kg (331 lb 2.1 oz) (!) 148.3 kg (326 lb 15.1 oz)     Examination:  General exam: walking in room Respiratory system: wheezing in all lung fields, mildly dyspneic with talking Cardiovascular system: rrr Gastrointestinal system: +Bs, obese Central nervous system: A+Ox3 Psychiatry: normal mood and affect    Data Reviewed: I have personally reviewed following labs and imaging studies  CBC: Recent Labs  Lab 04/27/17 1253 04/28/17 0429  WBC 18.7* 16.8*  NEUTROABS 12.1*  --   HGB 11.8* 12.8  HCT 36.5 39.1  MCV 87.1 85.6  PLT 338 102   Basic Metabolic Panel: Recent Labs  Lab 04/27/17 1253 04/28/17 0911  NA 135 134*  K 3.3* 4.4  CL 101 103  CO2 26 23  GLUCOSE 93 183*  BUN 7 10  CREATININE 0.82 0.80  CALCIUM 7.8* 8.4*   GFR: Estimated Creatinine Clearance: 134.4 mL/min (by C-G formula based on SCr of 0.8 mg/dL). Liver Function Tests: Recent Labs  Lab 04/28/17 0911  AST 21  ALT 16  ALKPHOS 72  BILITOT 0.5  PROT 6.1*  ALBUMIN 3.1*   No results for input(s): LIPASE, AMYLASE in the last 168 hours. No results for input(s): AMMONIA in the last 168 hours. Coagulation Profile: No results for input(s): INR, PROTIME in the last 168 hours. Cardiac Enzymes: No results for input(s): CKTOTAL, CKMB, CKMBINDEX, TROPONINI in the last 168 hours. BNP (last 3 results) No results for input(s): PROBNP in the last 8760 hours. HbA1C: No results for input(s): HGBA1C in the last 72 hours. CBG: Recent Labs  Lab 04/28/17 0745 04/28/17 1212 04/28/17 1704 04/28/17 2159 04/29/17 0805  GLUCAP 149* 131* 169* 155* 165*   Lipid Profile: No results for input(s): CHOL, HDL, LDLCALC, TRIG, CHOLHDL, LDLDIRECT in the last 72 hours. Thyroid Function Tests: No results for input(s): TSH, T4TOTAL, FREET4, T3FREE, THYROIDAB in the last 72 hours. Anemia Panel: No results for input(s): VITAMINB12, FOLATE, FERRITIN, TIBC, IRON, RETICCTPCT in the last 72 hours. Urine analysis:    Component Value Date/Time   COLORURINE YELLOW 04/29/2017 0519    APPEARANCEUR HAZY (A) 04/29/2017 0519   LABSPEC 1.008 04/29/2017 0519   PHURINE 6.0 04/29/2017 0519   GLUCOSEU NEGATIVE 04/29/2017 0519   GLUCOSEU NEG mg/dL 10/28/2007 2049   HGBUR NEGATIVE 04/29/2017 0519   BILIRUBINUR NEGATIVE 04/29/2017 0519   KETONESUR NEGATIVE 04/29/2017 0519   PROTEINUR NEGATIVE 04/29/2017 0519   UROBILINOGEN 1.0 11/21/2014 0707   NITRITE NEGATIVE 04/29/2017 0519   LEUKOCYTESUR NEGATIVE 04/29/2017 0519     ) Recent Results (from the past 240 hour(s))  MRSA PCR Screening     Status: None   Collection Time: 04/28/17  5:05 PM  Result Value Ref Range Status   MRSA by PCR NEGATIVE NEGATIVE Final    Comment:        The GeneXpert MRSA Assay (FDA approved for NASAL specimens only), is one component of a comprehensive MRSA colonization surveillance program. It is not intended to diagnose MRSA infection nor to guide or  monitor treatment for MRSA infections.       Anti-infectives (From admission, onward)   Start     Dose/Rate Route Frequency Ordered Stop   04/28/17 1330  doxycycline (VIBRA-TABS) tablet 100 mg     100 mg Oral Every 12 hours 04/28/17 1315         Radiology Studies: X-ray Chest Pa And Lateral  Result Date: 04/28/2017 CLINICAL DATA:  Asthma exacerbation for the past week with onset of shortness of breath today. EXAM: CHEST  2 VIEW COMPARISON:  PA and lateral chest x-ray of April 27, 2017 FINDINGS: The lungs are adequately inflated. There is new increased density lateral to the left heart border. There is no pleural effusion or pneumothorax. The heart and pulmonary vascularity are normal. The mediastinum is normal in width. The bony thorax is unremarkable. IMPRESSION: New patchy increased density at the left lung base likely posteriorly consistent with atelectasis or early pneumonia. Followup PA and lateral chest X-ray is recommended in 3-4 weeks following trial of antibiotic therapy to ensure resolution and exclude underlying malignancy.  Electronically Signed   By: David  Martinique M.D.   On: 04/28/2017 08:32        Scheduled Meds: . amLODipine  10 mg Oral Daily  . budesonide  0.25 mg Nebulization BID  . busPIRone  7.5 mg Oral BID  . doxycycline  100 mg Oral Q12H  . enoxaparin (LOVENOX) injection  75 mg Subcutaneous Q24H  . famotidine  20 mg Oral BID  . fluticasone  2 spray Each Nare Daily  . insulin aspart  0-9 Units Subcutaneous TID WC  . ipratropium-albuterol  3 mL Nebulization Q4H  . losartan  100 mg Oral Daily  . methylPREDNISolone (SOLU-MEDROL) injection  40 mg Intravenous Q6H  . metoprolol tartrate  100 mg Oral BID  . montelukast  10 mg Oral QHS  . nicotine  14 mg Transdermal Daily  . sertraline  100 mg Oral Daily  . spironolactone  100 mg Oral Daily  . zolpidem  10 mg Oral QHS   Continuous Infusions:   LOS: 1 day    Time spent: 35 min    Geradine Girt, DO [facility] Pager [phone]  If 7PM-7AM, please contact night-coverage [email or web address] Password [email or web address] 04/29/2017, 9:19 AM

## 2017-04-30 ENCOUNTER — Telehealth: Payer: Self-pay | Admitting: Internal Medicine

## 2017-04-30 DIAGNOSIS — R0600 Dyspnea, unspecified: Secondary | ICD-10-CM

## 2017-04-30 LAB — GLUCOSE, CAPILLARY
Glucose-Capillary: 164 mg/dL — ABNORMAL HIGH (ref 65–99)
Glucose-Capillary: 147 mg/dL — ABNORMAL HIGH (ref 65–99)

## 2017-04-30 MED ORDER — PREDNISONE 20 MG PO TABS: 40.0000 mg | ORAL_TABLET | Freq: Every day | ORAL | Status: DC

## 2017-04-30 MED ORDER — DOXYCYCLINE HYCLATE 100 MG PO TABS: 100.0000 mg | ORAL_TABLET | Freq: Two times a day (BID) | ORAL | 0 refills | Status: DC

## 2017-04-30 MED ORDER — PREDNISONE 20 MG PO TABS: 40.0000 mg | ORAL_TABLET | Freq: Every day | ORAL | 0 refills | Status: DC

## 2017-04-30 MED ORDER — DIPHENHYDRAMINE HCL 25 MG PO CAPS
25.0000 mg | ORAL_CAPSULE | Freq: Four times a day (QID) | ORAL | Status: DC | PRN
  Administered 2017-04-30: 25 mg via ORAL
  Filled 2017-04-30: qty 1

## 2017-04-30 MED ORDER — OMEPRAZOLE 20 MG PO CPDR: 20.0000 mg | DELAYED_RELEASE_CAPSULE | Freq: Two times a day (BID) | ORAL | Status: DC

## 2017-04-30 MED ORDER — SALINE SPRAY 0.65 % NA SOLN
1.0000 | NASAL | Status: DC | PRN
  Administered 2017-04-30: 1 via NASAL
  Filled 2017-04-30: qty 44

## 2017-04-30 NOTE — Progress Notes (Signed)
Pt expressed she needed to stay one more night. I spoke to Dr. Eliseo Squires about this.  Dr. Eliseo Squires advised that the pt should be ready for discharge, to walk the patient and see if she deSats.   I told the Pt this.  Pt stated this was not the problem, that is was Shortness of breath, that she does not deSat when she walks.   She then said that she would go, and more than likely would be back in 24 hours. I told the Pt that I would be in her room to discharge her. Before I could get in to review her discharge instructions, paperwork and remove her PIV, Pt left in W/C with a gentleman.  Pt did not have her discharge instructions, prescriptions or removal of her PIV. Dr. Eliseo Squires notified of the above.

## 2017-04-30 NOTE — Telephone Encounter (Signed)
lmtcb x1 for pt. 

## 2017-04-30 NOTE — Progress Notes (Signed)
Attempted to call patient x2, Unsuccessful. Message left

## 2017-04-30 NOTE — Discharge Summary (Signed)
Physician Discharge Summary  April [name] [id number] DOB: 1970-07-15 DOA: 04/27/2017  PCP: Arnoldo Morale, MD  Admit date: 04/27/2017 Discharge date: 04/30/2017   Recommendations for Outpatient Follow-Up:   Patient left without prescriptions She refused to have her O2 saturations checked while ambulating She was able to shower and walk with no issues Please see consult from Dr. Nelda Marseille from 52/65:47 year old morbidly obese female with compliance issues and well as concern for malingering.  Patient was clearly walking the hallway with absolutely no difficulty.  She was not wheezing when I was listening to her from outside the room but the second I walked into the room she began to audibly wheeze that I could here from a cross the room.  This is clearly a case of malingering and non-compliance with medications in a patient with asthma that is difficult to determine the extent off.  Discussed with [facility]-MD.     Discharge Diagnosis:   Active Problems:   Morbid obesity with body mass index of 50.0-59.9 in adult [facility])   Essential hypertension   Hypokalemia   Primary insomnia   Tobacco abuse   Leukocytosis   Prediabetes   GERD (gastroesophageal reflux disease)   Anxiety and depression   Normocytic anemia   Moderate persistent asthma with exacerbation   OSA (obstructive sleep apnea)   Asthma exacerbation   Discharge disposition:  Left before d/c could be done  Discharge Condition: Improved.  Diet recommendation: Low sodium, heart healthy.  Carbohydrate-modified  Wound care: None.   History of Present Illness:   April [name] is a 47 y.o. female with a Past Medical History of morbid obesity, HTN and poorly controlled asthma.  Patient has improved some in the ER but not back to baseline.  Still smoking on and off.  Does not appear to have follow up with pulm regularly and re-scheduled 12/10 appointment for 1/4.  Continue IV Steroids and hope for d/c in AM if improved.      Hospital Course by Problem:   Moderate persistent asthmawith acute exacerbation, with frequent admissions for the same.  -last seen at the ED on 12/27, at discharge on prednisone, returning with increasing shortness of breath and wheezing. Received Solu-Medrol 125 mg x1, and several rounds of albuterol, with eventual 1 round of continuous albuterol, with some improvement. She also received ipratropium with some response. However, her wheezing did not resolve, for which she is being admitted. The patient sees Dr. Annamaria Boots, but per chart review, there is concern with possible noncompliant with appointments and medications. She also continues to smoke.  -chest x ray suggestive of PNA: doxy -patient not sure of what neb/inhalers she uses or is supposed to be on Patient needs to have a follow-up with pulmonologist after discharge-- has appointment on 1/4 -patient when observed from the door had no wheezing but when I walked in and introduced myself, she began to have audible wheezing  Dyspnea -multifactorial, suspect weight/deconditioning plays a role  OSA Continue CPAP nightly  ChronicDiastolic CHF:  Gr 1 DD Continue meds with Lopressor, diuretics, losartan, spironolactone, Norvasc   Hypertension stable  Hypokalemia, likely due to recent use of diuretics with Lasix.Potassium was 3.3, repleted in the ED  Anxiety and depression Continue homeZoloft, BuSpar  Insomnia Continue Ambien  GERD,no acute symptoms ContinuePepcid  Morbid obesity Body mass index is 52.77 kg/m.      Medical Consultants:    None.   Discharge Exam:   Vitals:   04/30/17 2703 04/30/17 1224  BP: (!) 170/71   Pulse: 65   Resp:    Temp:    SpO2:  99%   Vitals:   04/30/17 0815 04/30/17 0817 04/30/17 0938 04/30/17 1224  BP:   (!) 170/71   Pulse:   65   Resp:      Temp:      TempSrc:      SpO2: 96% 96%  99%  Weight:      Height:        Left without  prescriptions   The results of significant diagnostics from this hospitalization (including imaging, microbiology, ancillary and laboratory) are listed below for reference.     Procedures and Diagnostic Studies:   X-ray Chest Pa And Lateral  Result Date: 04/28/2017 CLINICAL DATA:  Asthma exacerbation for the past week with onset of shortness of breath today. EXAM: CHEST  2 VIEW COMPARISON:  PA and lateral chest x-ray of April 27, 2017 FINDINGS: The lungs are adequately inflated. There is new increased density lateral to the left heart border. There is no pleural effusion or pneumothorax. The heart and pulmonary vascularity are normal. The mediastinum is normal in width. The bony thorax is unremarkable. IMPRESSION: New patchy increased density at the left lung base likely posteriorly consistent with atelectasis or early pneumonia. Followup PA and lateral chest X-ray is recommended in 3-4 weeks following trial of antibiotic therapy to ensure resolution and exclude underlying malignancy. Electronically Signed   By: David  Martinique M.D.   On: 04/28/2017 08:32   Dg Chest 2 View  Result Date: 04/27/2017 CLINICAL DATA:  COPD. EXAM: CHEST  2 VIEW COMPARISON:  04/24/2017 FINDINGS: The heart size and mediastinal contours are within normal limits. Both lungs are clear. The visualized skeletal structures are unremarkable. IMPRESSION: No active cardiopulmonary disease. Electronically Signed   By: Kerby Moors M.D.   On: 04/27/2017 07:40     Labs:   Basic Metabolic Panel: Recent Labs  Lab 04/27/17 1253 04/28/17 0911  NA 135 134*  K 3.3* 4.4  CL 101 103  CO2 26 23  GLUCOSE 93 183*  BUN 7 10  CREATININE 0.82 0.80  CALCIUM 7.8* 8.4*   GFR Estimated Creatinine Clearance: 134.4 mL/min (by C-G formula based on SCr of 0.8 mg/dL). Liver Function Tests: Recent Labs  Lab 04/28/17 0911  AST 21  ALT 16  ALKPHOS 72  BILITOT 0.5  PROT 6.1*  ALBUMIN 3.1*   No results for input(s): LIPASE,  AMYLASE in the last 168 hours. No results for input(s): AMMONIA in the last 168 hours. Coagulation profile No results for input(s): INR, PROTIME in the last 168 hours.  CBC: Recent Labs  Lab 04/27/17 1253 04/28/17 0429  WBC 18.7* 16.8*  NEUTROABS 12.1*  --   HGB 11.8* 12.8  HCT 36.5 39.1  MCV 87.1 85.6  PLT 338 316   Cardiac Enzymes: No results for input(s): CKTOTAL, CKMB, CKMBINDEX, TROPONINI in the last 168 hours. BNP: Invalid input(s): POCBNP CBG: Recent Labs  Lab 04/29/17 1207 04/29/17 1710 04/29/17 2017 04/30/17 0811 04/30/17 1204  GLUCAP 161* 157* 147* 164* 147*   D-Dimer No results for input(s): DDIMER in the last 72 hours. Hgb A1c Recent Labs    04/28/17 0429  HGBA1C 5.6   Lipid Profile No results for input(s): CHOL, HDL, LDLCALC, TRIG, CHOLHDL, LDLDIRECT in the last 72 hours. Thyroid function studies No results for input(s): TSH, T4TOTAL, T3FREE, THYROIDAB in the last 72 hours.  Invalid input(s): FREET3 Anemia work up No results for  input(s): VITAMINB12, FOLATE, FERRITIN, TIBC, IRON, RETICCTPCT in the last 72 hours. Microbiology Recent Results (from the past 240 hour(s))  MRSA PCR Screening     Status: None   Collection Time: 04/28/17  5:05 PM  Result Value Ref Range Status   MRSA by PCR NEGATIVE NEGATIVE Final    Comment:        The GeneXpert MRSA Assay (FDA approved for NASAL specimens only), is one component of a comprehensive MRSA colonization surveillance program. It is not intended to diagnose MRSA infection nor to guide or monitor treatment for MRSA infections.      Discharge Instructions:   Discharge Instructions    Diet - low sodium heart healthy   Complete by:  As directed    Increase activity slowly   Complete by:  As directed      Allergies as of 04/30/2017   No Known Allergies     Medication List    STOP taking these medications   diclofenac 75 MG EC tablet Commonly known as:  VOLTAREN   guaiFENesin 600 MG 12 hr  tablet Commonly known as:  MUCINEX   mometasone-formoterol 200-5 MCG/ACT Aero Commonly known as:  DULERA   ondansetron 4 MG disintegrating tablet Commonly known as:  ZOFRAN ODT   potassium chloride 10 MEQ tablet Commonly known as:  K-DUR     TAKE these medications   albuterol 108 (90 Base) MCG/ACT inhaler Commonly known as:  PROVENTIL HFA;VENTOLIN HFA Inhale 2 puffs every 4 (four) hours as needed into the lungs for wheezing or shortness of breath.   amLODipine 10 MG tablet Commonly known as:  NORVASC Take 1 tablet (10 mg total) daily by mouth.   arformoterol 15 MCG/2ML Nebu Commonly known as:  BROVANA Take 2 mLs (15 mcg total) 2 (two) times daily by nebulization.   budesonide 0.25 MG/2ML nebulizer solution Commonly known as:  PULMICORT Take 2 mLs (0.25 mg total) 2 (two) times daily by nebulization.   busPIRone 7.5 MG tablet Commonly known as:  BUSPAR Take 1 tablet (7.5 mg total) 2 (two) times daily by mouth.   butalbital-acetaminophen-caffeine 50-325-40 MG tablet Commonly known as:  FIORICET, ESGIC Take 1-2 tablets by mouth every 6 (six) hours as needed for headache.   doxycycline 100 MG tablet Commonly known as:  VIBRA-TABS Take 1 tablet (100 mg total) by mouth every 12 (twelve) hours.   famotidine 20 MG tablet Commonly known as:  PEPCID Take 1 tablet (20 mg total) by mouth 2 (two) times daily.   fluticasone 50 MCG/ACT nasal spray Commonly known as:  FLONASE Place 2 sprays into both nostrils daily.   furosemide 40 MG tablet Commonly known as:  LASIX Take 1 tablet (40 mg total) daily by mouth.   loratadine 10 MG tablet Commonly known as:  CLARITIN Take 1 tablet (10 mg total) by mouth daily.   losartan 100 MG tablet Commonly known as:  COZAAR Take 1 tablet (100 mg total) daily by mouth.   metoprolol tartrate 100 MG tablet Commonly known as:  LOPRESSOR Take 1 tablet (100 mg total) by mouth 2 (two) times daily.   montelukast 10 MG tablet Commonly known  as:  SINGULAIR Take 1 tablet (10 mg total) at bedtime by mouth.   nicotine 14 mg/24hr patch Commonly known as:  NICODERM CQ - dosed in mg/24 hours Place 1 patch (14 mg total) onto the skin daily.   omeprazole 20 MG capsule Commonly known as:  PRILOSEC Take 1 capsule (20 mg total) by  mouth 2 (two) times daily.   predniSONE 20 MG tablet Commonly known as:  DELTASONE Take 2 tablets (40 mg total) by mouth daily with breakfast. Start taking on:  05/01/2017   sertraline 100 MG tablet Commonly known as:  ZOLOFT Take 1 tablet (100 mg total) daily by mouth.   spironolactone 100 MG tablet Commonly known as:  ALDACTONE Take 1 tablet (100 mg total) daily by mouth.   zolpidem 10 MG tablet Commonly known as:  AMBIEN Take 10 mg by mouth at bedtime.         Time coordinating discharge: 35 min  Signed:  Geradine Girt   [facility] 04/30/2017, 12:53 PM

## 2017-05-01 ENCOUNTER — Telehealth: Payer: Self-pay | Admitting: Internal Medicine

## 2017-05-01 ENCOUNTER — Telehealth: Payer: Self-pay

## 2017-05-01 NOTE — Telephone Encounter (Signed)
[facility] Post-discharge Follow-Up Phone Call:  Date of Discharge: 04/30/2017 Principal Discharge Diagnosis(es): asthma exacerbation, morbid obesity Post-discharge Communication: (Clearly document all attempts clearly and date contact made) call placed to the patient Call Completed: yes                 With Whom: patient Interpreter Needed: No     Please check all that apply:  X  Patient is knowledgeable of his/her condition(s) and/or treatment. ? Patient is caring for self at home.  X  Patient is receiving assist at home from family and/or caregiver. Family and/or caregiver is knowledgeable of patient's condition(s) and/or treatment. ? Patient is receiving home health services. If so, name of agency.     Medication Reconciliation:  ? Medication list reviewed with patient. X  Patient obtained all discharge medications. If not, why? - she said that she was not given any prescriptions and does not have the prednisone or doxycycline.  She said she was not given a list of her discharge medications. She did note that she has all of her other medications. This CM reviewed the medications with her that she is to stop as per her discharge AVS. She stated that she is seeing Dr Annamaria Boots tomorrow and will obtain a list of her current medications at that time and will request prescriptions for the medications that she is missing. She confirmed  that she has a nebulizer.    Activities of Daily Living:  X  Independent- -with some assistance from [facility] aide ? Needs assist (describe; ? home DME used) ? [facility] (describe, ? home DME used)   Community resources in place for patient:  X  None  - 80 hours/month of P[facility] X Home Health/Home DME - CPAP from [facility]. She needs to contact [facility] to schedule an assessment. She said she is not sure that she has the phone # for [facility] and was in the car at the time of this CM call.  She will call this CM back if she needs the [facility] phone #.  She also needs  to confirm if she is registered for [facility] transportation. ? Assisted Living ? Support Group          Patient Education: She stated that she is not sure why she was sent home stating that she is only feeling a " little better" She said that she knows when to go back to the hospital if needed and she will go back if she doesn't start to feel better.         Questions/Concerns discussed: She was agreeable to scheduling a follow up appointment with the [facility] and an appointment was scheduled for 05/09/17 @ 0945.  She had no other questions/concerns at this time.  Update sent to Dr Jarold Song regarding the status of her medications.

## 2017-05-01 NOTE — Telephone Encounter (Signed)
lmtcb x2 for pt. 

## 2017-05-01 NOTE — Telephone Encounter (Signed)
Patient is scheduled to see CY on 05/02/17. Called [facility] to see if they could provide a download for her CPAP machine. Spoke with Amy from the RT department. Order was placed back in November for a replacement CPAP machine but patient declined because she did not want to participate in autopay.   Also called [facility]. An order was sent to them in April 2018. They advised that they had tried several times to get in touch with the patient but she never called back. The order was then closed due to lack of contact with the patient.

## 2017-05-02 ENCOUNTER — Ambulatory Visit: Payer: Medicaid Other | Admitting: Internal Medicine

## 2017-05-02 ENCOUNTER — Telehealth: Payer: Self-pay | Admitting: Internal Medicine

## 2017-05-02 MED ORDER — PREDNISONE 10 MG PO TABS: ORAL_TABLET | ORAL | 0 refills | Status: DC

## 2017-05-02 NOTE — Telephone Encounter (Signed)
Pt returning phone call. Cb is [phone].

## 2017-05-02 NOTE — Telephone Encounter (Signed)
Rx has been sent in per CY. I have left a message with the pt making her aware per her request. Will route message to [facility] to follow up on rescheduling the pt's appointment.

## 2017-05-02 NOTE — Telephone Encounter (Signed)
We will need to speak to April [name] to find out where the pt can be reschedule. lmtcb x1 for pt.

## 2017-05-02 NOTE — Telephone Encounter (Signed)
Spoke with patient Pt called verifying RX sent Nothing further needed

## 2017-05-02 NOTE — Telephone Encounter (Signed)
.  We have contacted pt X 3 and per office protocol , this encounter will be closed.  ATC pt, no answer. Left message for pt to call back.

## 2017-05-02 NOTE — Telephone Encounter (Signed)
Patient is asking for prednisone until she can be seen.  Sherian Rein is [facility] on [address].  CB is [phone].

## 2017-05-02 NOTE — Telephone Encounter (Signed)
ATC pt, no answer. Left message for pt to call back.  

## 2017-05-02 NOTE — Telephone Encounter (Signed)
Patient returned call [phone].

## 2017-05-02 NOTE — Telephone Encounter (Signed)
Spoke with pt. She is aware that we need to speak to [facility] about rescheduling her appointment. Pt is requesting a prednisone prescription. States that she is having SOB, chest tightness, wheezing and cough. Cough is non productive. Denies fever, chills, sweats or body aches.  CY - please advise on prednisone, thanks.

## 2017-05-02 NOTE — Telephone Encounter (Signed)
Offer prednisone 10 mg, # 20, 4 X 2 DAYS, 3 X 2 DAYS, 2 X 2 DAYS, 1 X 2 DAYS  And please leave message about reschedule visit in [name]'s box

## 2017-05-09 ENCOUNTER — Inpatient Hospital Stay: Payer: Medicaid Other | Admitting: Family Medicine

## 2017-05-09 ENCOUNTER — Telehealth: Payer: Self-pay | Admitting: Family Medicine

## 2017-05-09 NOTE — Telephone Encounter (Signed)
Received call from patient this morning. Pt stated that she had overslept and that's why she didn't come to her appointment. Rescheduled appointment for Monday 05/12/17 @ 2pm.   During conversation patient also stated that she needed to have her prescription Prednisone transfered to [facility] because she didn't have transportation to our office to come and pick up medication.   Spoke with April [name] [facility] Techin[facility]) regarding patient's medication, and she informed me that patient would need to call [facility] and have medication transferred to them.   Informed patient of what April [name] stated and April [name] stated that she had already asked [facility] to have medication transferred but they couldn't do it. Patient stated that she would try one more time and if they informed her the same thing she would call me and let me know.

## 2017-05-12 ENCOUNTER — Inpatient Hospital Stay: Payer: Medicaid Other | Admitting: Family Medicine

## 2017-05-14 ENCOUNTER — Other Ambulatory Visit: Payer: Self-pay | Admitting: Internal Medicine

## 2017-05-16 ENCOUNTER — Telehealth: Payer: Self-pay | Admitting: Family Medicine

## 2017-05-16 NOTE — Telephone Encounter (Signed)
Call placed to patient [phone] twice to check on her status and make sure that she has her medication and is taking them. Unable to reach patient. Call drops after several rings (6). Will try to reach her again.

## 2017-05-16 NOTE — Telephone Encounter (Signed)
Call placed to patient late in the afternoon to remind her of appointment and to ask about her medication. Patient's daughter, Oriana Horiuchi, answered and informed me that patient was not available. Informed daughter that patient has an appointment with provider on Monday 1/21 @ 2 pm. Daughter will relate message to patient.

## 2017-05-19 ENCOUNTER — Inpatient Hospital Stay: Payer: Medicaid Other | Admitting: Family Medicine

## 2017-05-21 ENCOUNTER — Inpatient Hospital Stay: Payer: Medicaid Other | Admitting: Family Medicine

## 2017-05-21 ENCOUNTER — Telehealth: Payer: Self-pay

## 2017-05-21 IMAGING — CR DG CHEST 2V
2 series · 2 of 2 positions shown · non-contrast
Comparison: 04/30/2016, 03/22/2016 and earlier.

CLINICAL DATA: 45-year-old presenting with 1 week history of
wheezing that is not being treated with rescue inhaler or nebulizer
treatments at home. Current history of asthma.

EXAM:
CHEST  2 VIEW

[chest pa]
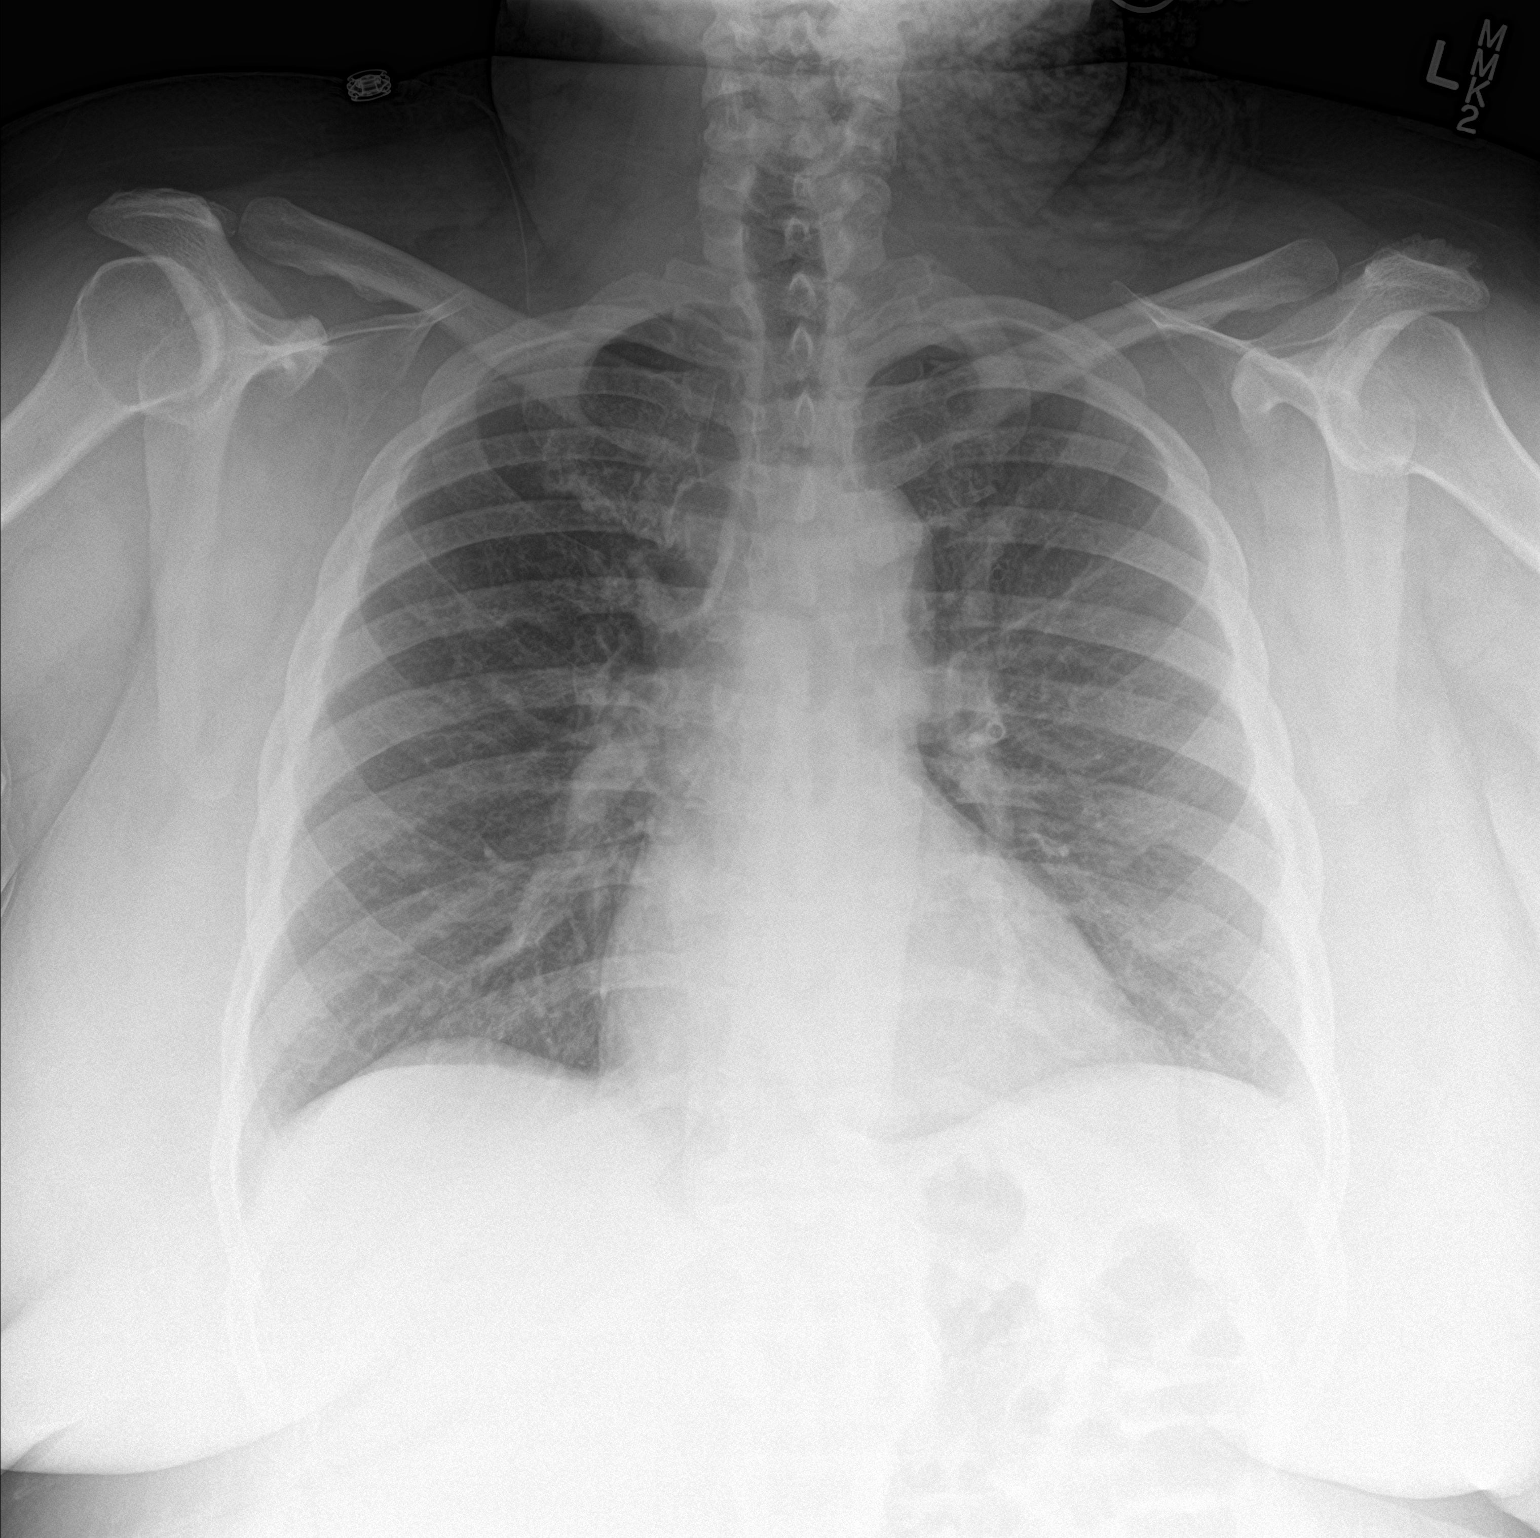

[chest lat]
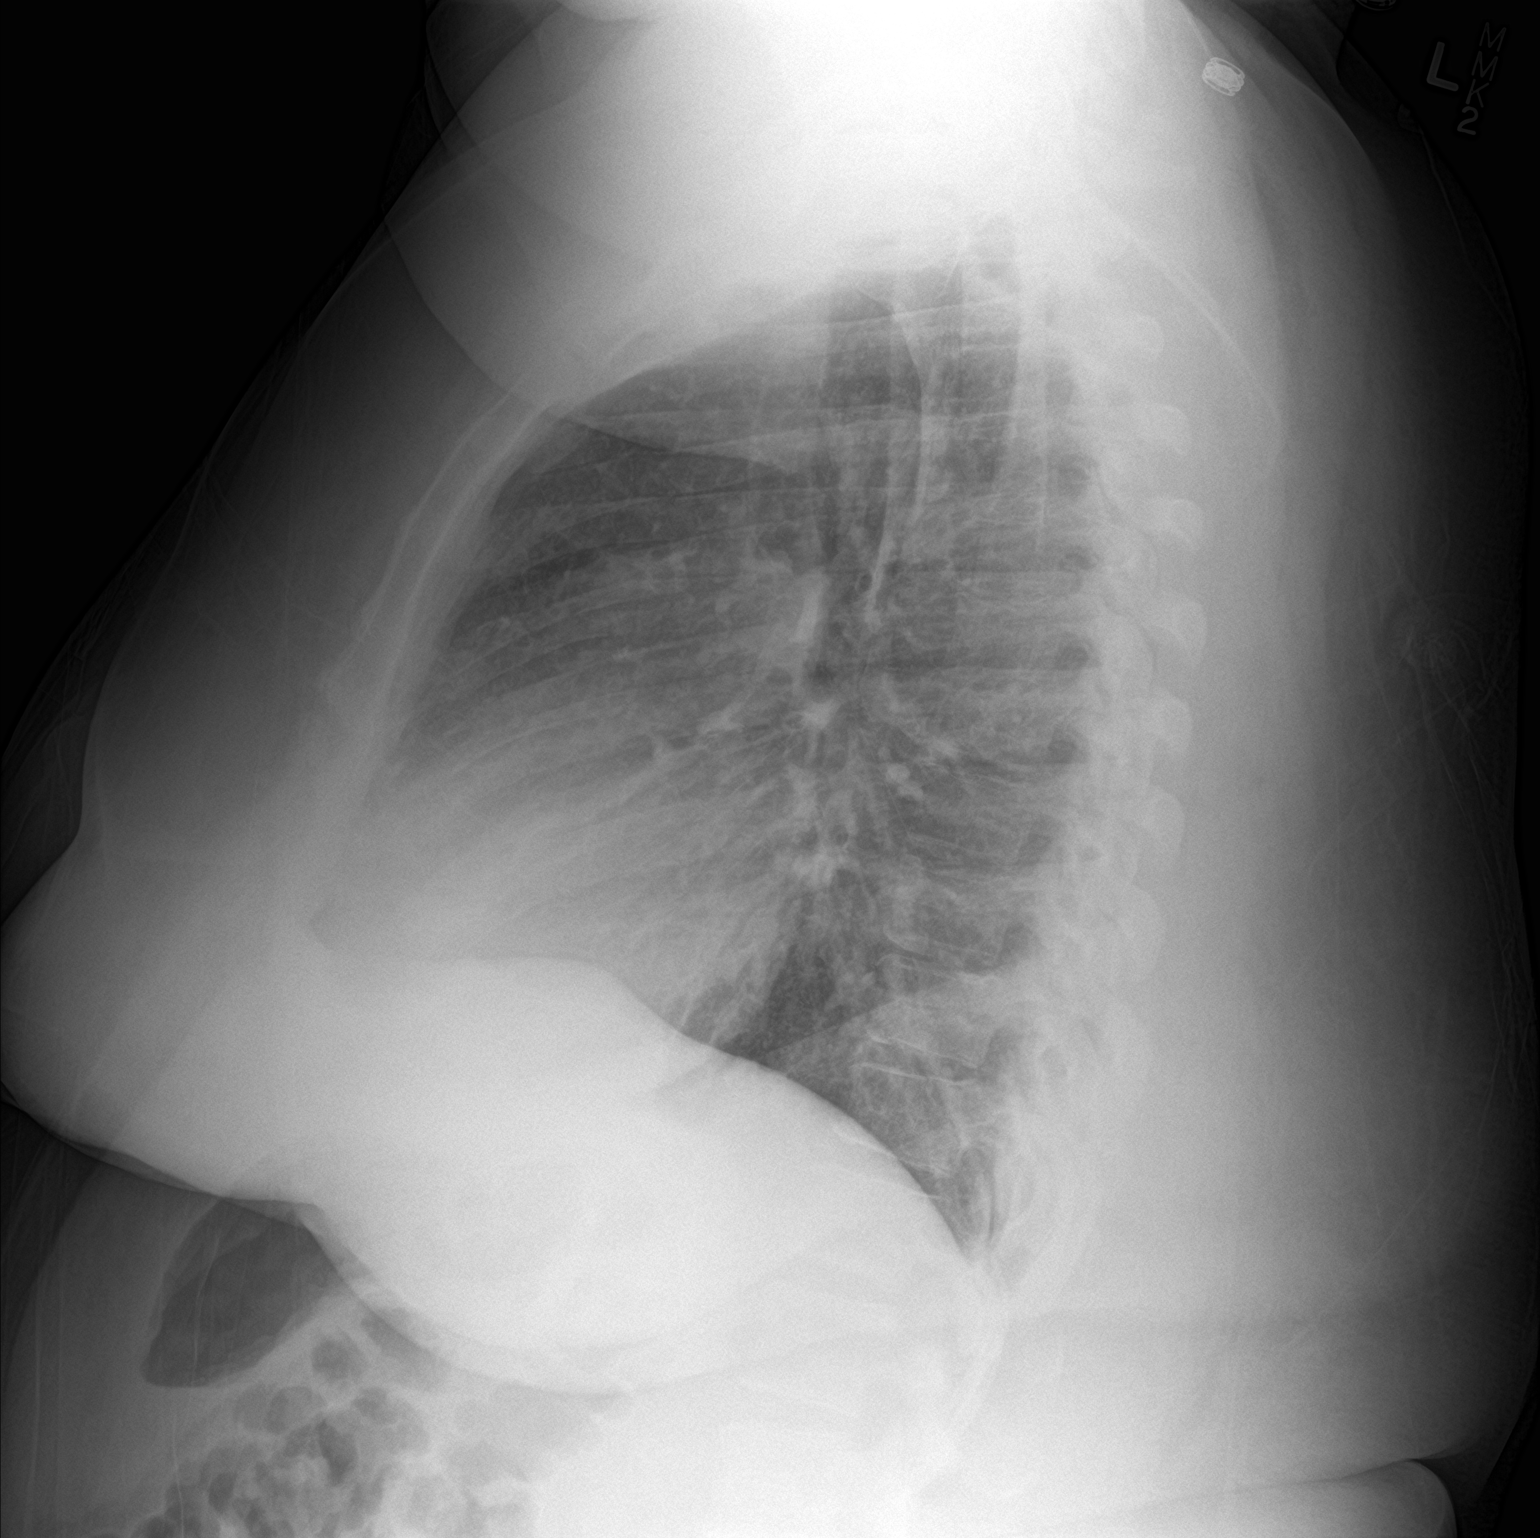

[2 of 2 positions shown; findings below may reference images not displayed]

FINDINGS: Cardiomediastinal silhouette unremarkable, unchanged. Mild central
peribronchial thickening, similar to the prior examinations. Lungs
otherwise clear. No localized airspace consolidation. No pleural
effusions. No pneumothorax. Normal pulmonary vascularity. Visualized
bony thorax intact.
IMPRESSION: Stable mild changes of chronic bronchitis and/or asthma. No acute
cardiopulmonary disease.

## 2017-05-21 NOTE — Telephone Encounter (Signed)
Attempted to contact the patient to check on her, inquire if she has all of her medications and discuss scheduling a follow up appointment at [facility]. Call placed to # [phone] and a HIPAA compliant voicemail message was left requesting a call back to # [phone].

## 2017-05-25 IMAGING — DX DG CHEST 2V
2 series · 2 of 2 positions shown · non-contrast
Comparison: Chest radiograph performed 05/05/2016

CLINICAL DATA: Acute onset of shortness of breath. Initial
encounter.

EXAM:
CHEST  2 VIEW

[chest pa]
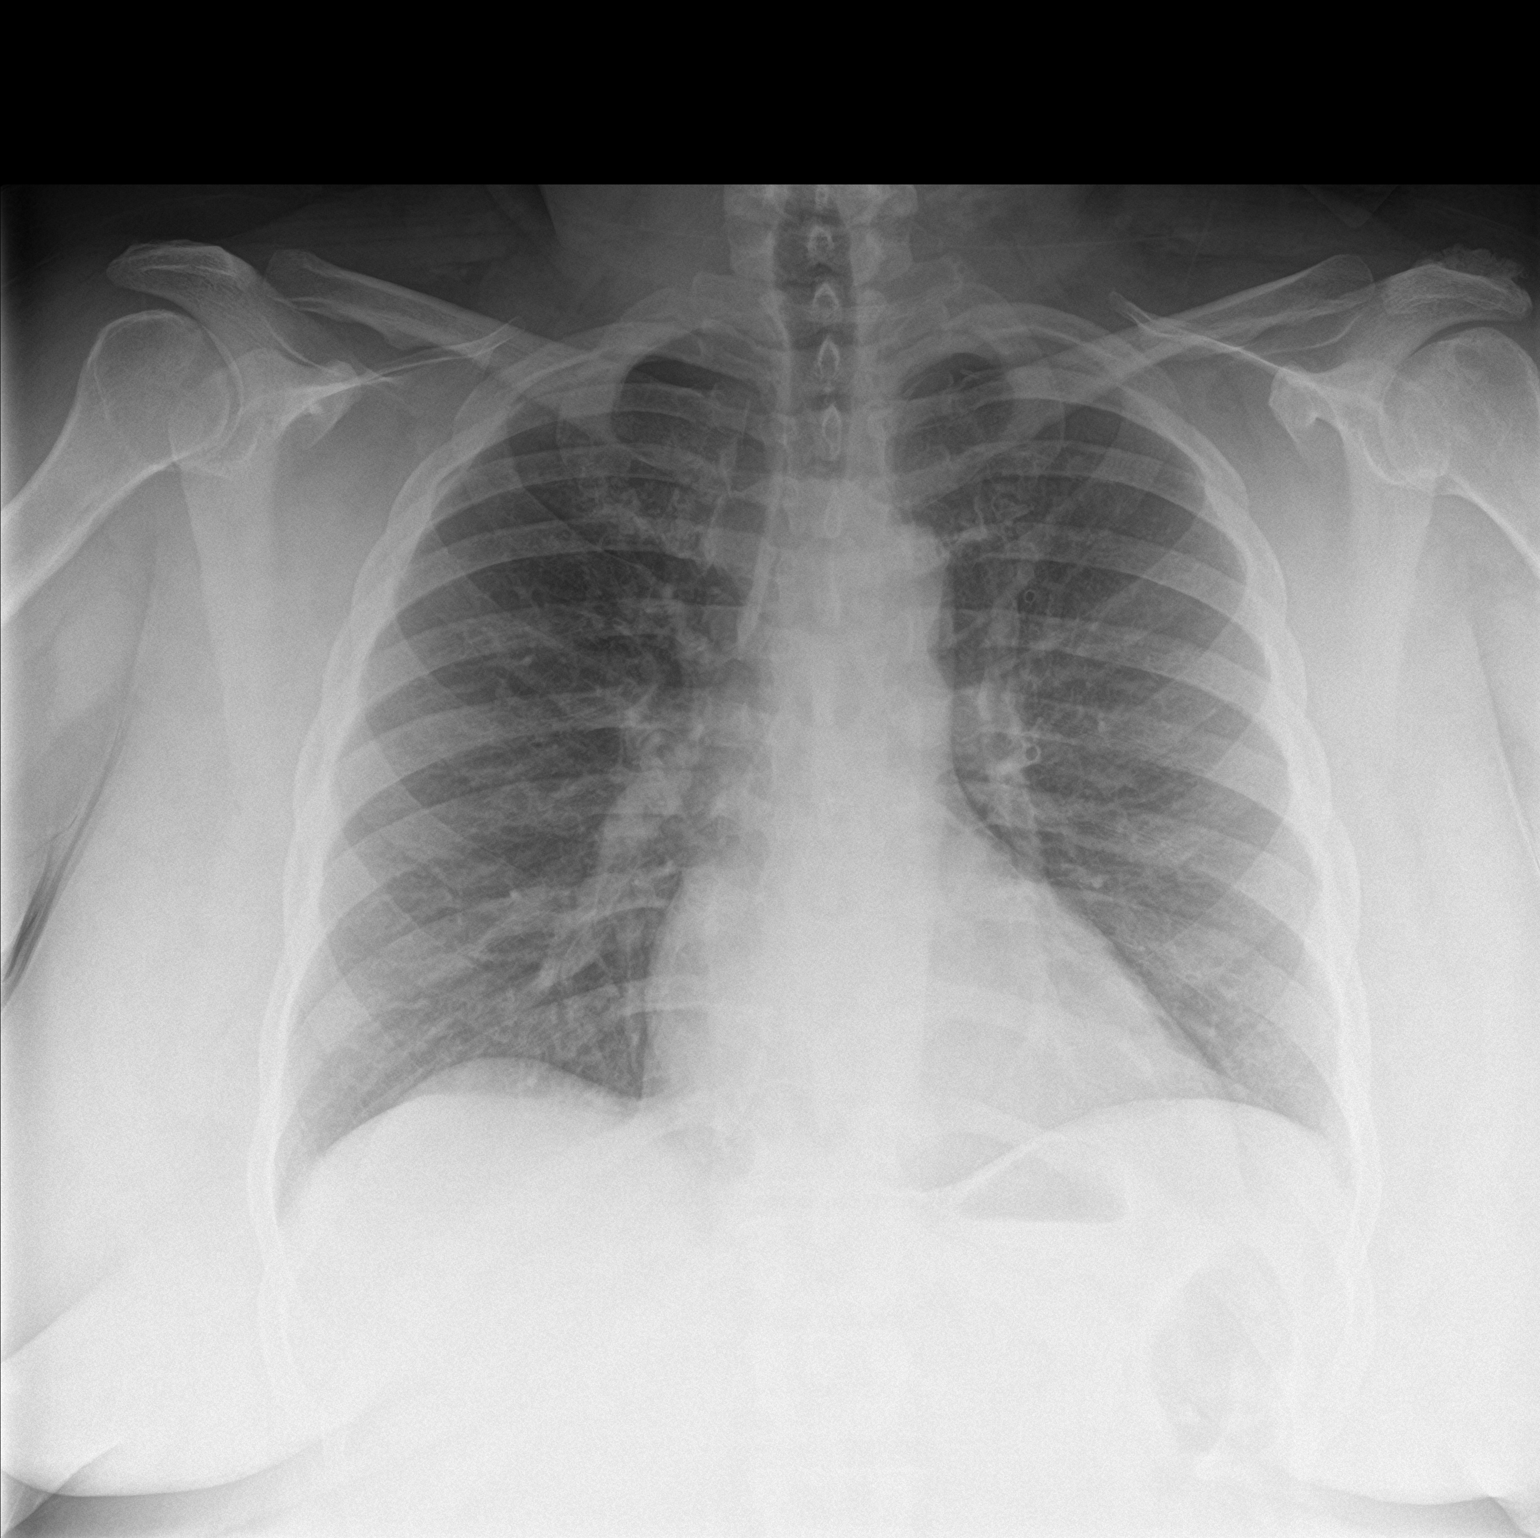

[chest lat]
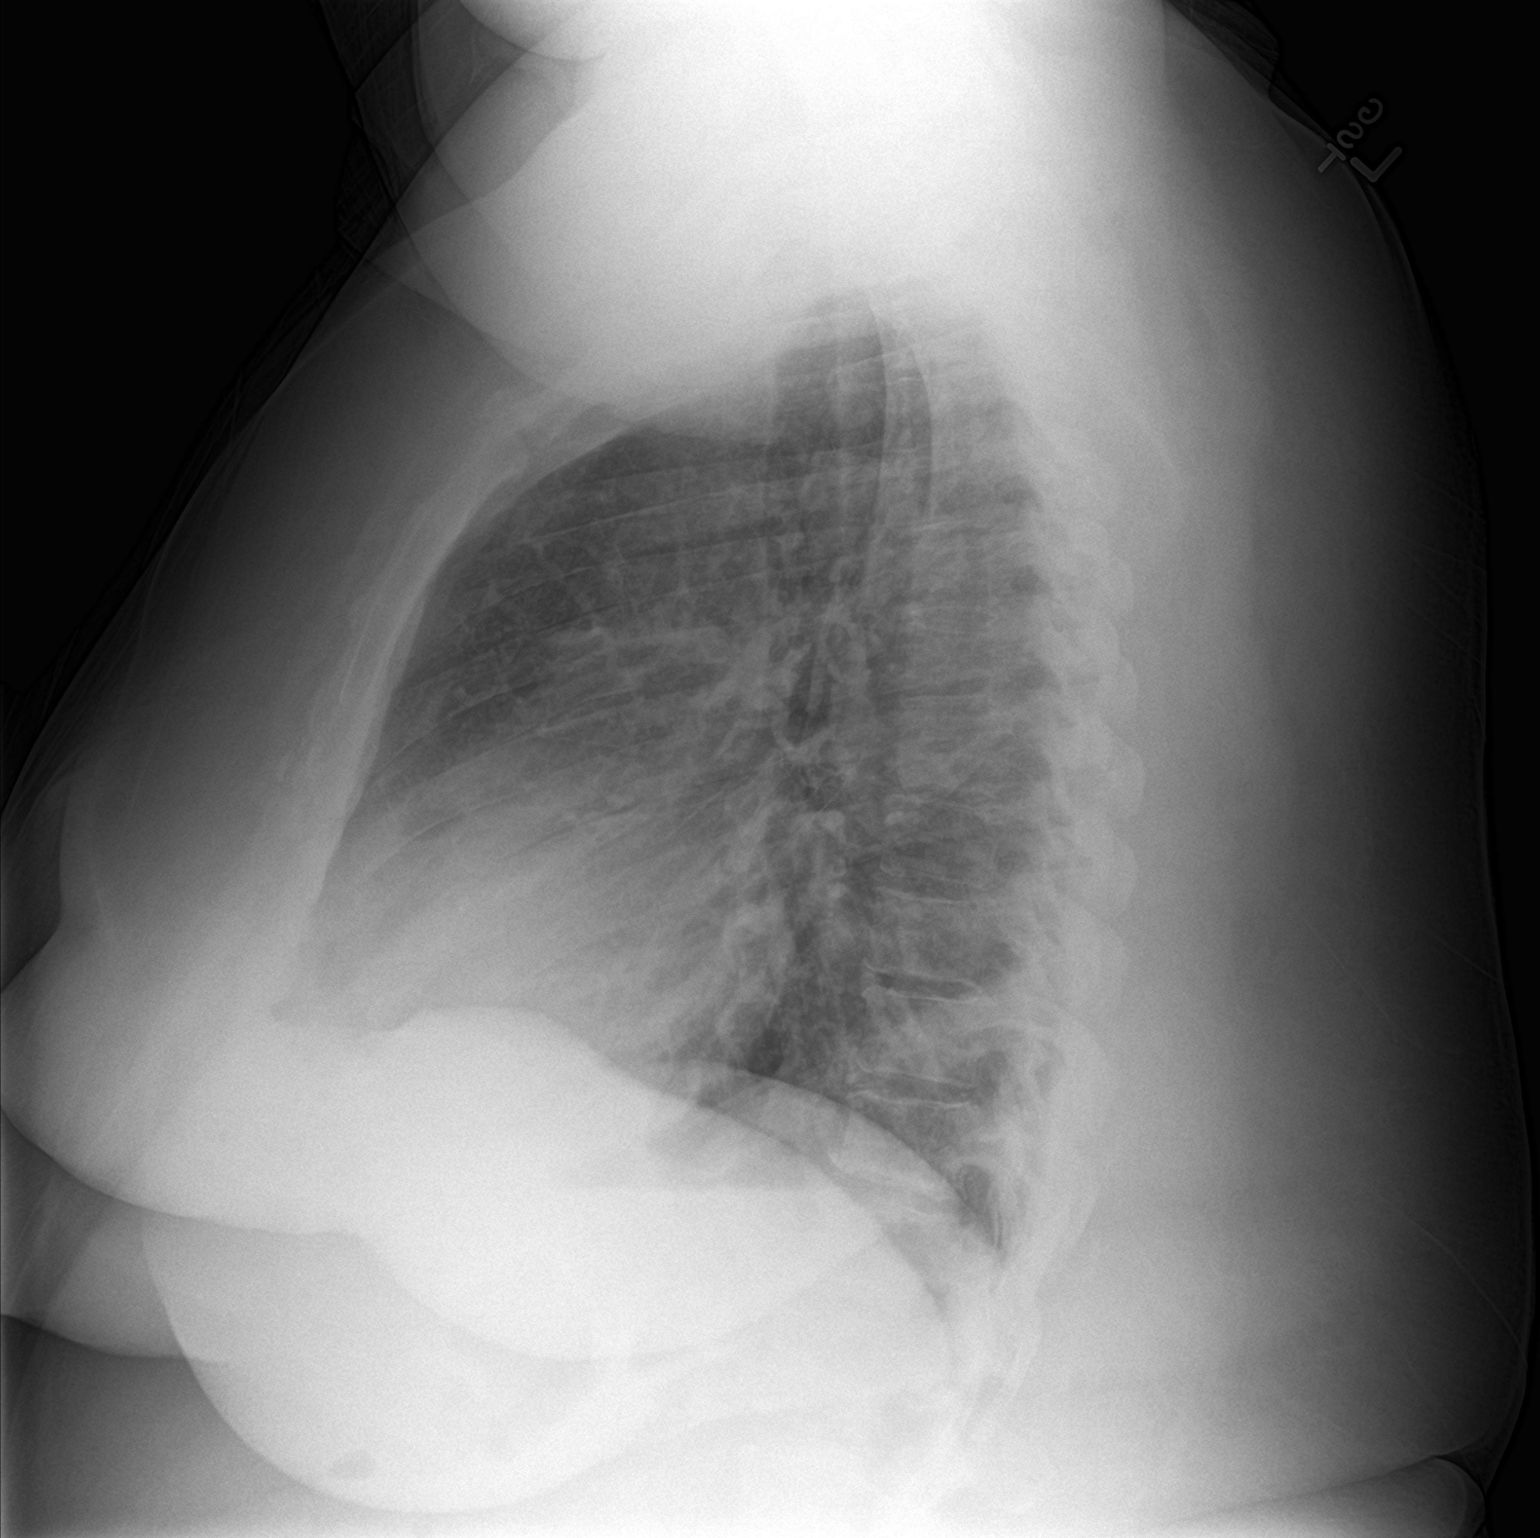

[2 of 2 positions shown; findings below may reference images not displayed]

FINDINGS: The lungs are well-aerated. Mild vascular congestion is noted. Mild
peribronchial thickening is seen. There is no evidence of focal
opacification, pleural effusion or pneumothorax.

The heart is normal in size; the mediastinal contour is within
normal limits. No acute osseous abnormalities are seen.
IMPRESSION: Mild vascular congestion noted.  Mild peribronchial thickening seen.

## 2017-05-26 ENCOUNTER — Encounter (HOSPITAL_COMMUNITY): Payer: Self-pay | Admitting: Emergency Medicine

## 2017-05-26 ENCOUNTER — Inpatient Hospital Stay (HOSPITAL_COMMUNITY)
Admission: EM | Admit: 2017-05-26 | Discharge: 2017-06-01 | DRG: 190 | Disposition: A | Discharge status: Home / Self Care | Payer: Medicaid Other | Attending: Internal Medicine | Admitting: Internal Medicine

## 2017-05-26 ENCOUNTER — Other Ambulatory Visit: Payer: Self-pay

## 2017-05-26 ENCOUNTER — Emergency Department (HOSPITAL_COMMUNITY): Payer: Medicaid Other

## 2017-05-26 DIAGNOSIS — Z87891 Personal history of nicotine dependence: Secondary | ICD-10-CM | POA: Diagnosis not present

## 2017-05-26 DIAGNOSIS — G4700 Insomnia, unspecified: Secondary | ICD-10-CM | POA: Diagnosis present

## 2017-05-26 DIAGNOSIS — R000 Tachycardia, unspecified: Secondary | ICD-10-CM | POA: Diagnosis present

## 2017-05-26 DIAGNOSIS — F339 Major depressive disorder, recurrent, unspecified: Secondary | ICD-10-CM | POA: Diagnosis present

## 2017-05-26 DIAGNOSIS — I4891 Unspecified atrial fibrillation: Secondary | ICD-10-CM | POA: Diagnosis not present

## 2017-05-26 DIAGNOSIS — I481 Persistent atrial fibrillation: Secondary | ICD-10-CM | POA: Diagnosis present

## 2017-05-26 DIAGNOSIS — K219 Gastro-esophageal reflux disease without esophagitis: Secondary | ICD-10-CM | POA: Diagnosis present

## 2017-05-26 DIAGNOSIS — J4551 Severe persistent asthma with (acute) exacerbation: Secondary | ICD-10-CM | POA: Diagnosis present

## 2017-05-26 DIAGNOSIS — I160 Hypertensive urgency: Secondary | ICD-10-CM | POA: Diagnosis present

## 2017-05-26 DIAGNOSIS — Z6841 Body Mass Index (BMI) 40.0 and over, adult: Secondary | ICD-10-CM | POA: Diagnosis not present

## 2017-05-26 DIAGNOSIS — F419 Anxiety disorder, unspecified: Secondary | ICD-10-CM | POA: Diagnosis present

## 2017-05-26 DIAGNOSIS — Z7952 Long term (current) use of systemic steroids: Secondary | ICD-10-CM

## 2017-05-26 DIAGNOSIS — I5032 Chronic diastolic (congestive) heart failure: Secondary | ICD-10-CM | POA: Diagnosis present

## 2017-05-26 DIAGNOSIS — J441 Chronic obstructive pulmonary disease with (acute) exacerbation: Secondary | ICD-10-CM | POA: Diagnosis present

## 2017-05-26 DIAGNOSIS — I110 Hypertensive heart disease with heart failure: Secondary | ICD-10-CM | POA: Diagnosis present

## 2017-05-26 DIAGNOSIS — J45901 Unspecified asthma with (acute) exacerbation: Secondary | ICD-10-CM | POA: Diagnosis not present

## 2017-05-26 DIAGNOSIS — G4733 Obstructive sleep apnea (adult) (pediatric): Secondary | ICD-10-CM | POA: Diagnosis present

## 2017-05-26 DIAGNOSIS — A419 Sepsis, unspecified organism: Secondary | ICD-10-CM | POA: Diagnosis present

## 2017-05-26 DIAGNOSIS — Z79899 Other long term (current) drug therapy: Secondary | ICD-10-CM

## 2017-05-26 DIAGNOSIS — Z9119 Patient's noncompliance with other medical treatment and regimen: Secondary | ICD-10-CM | POA: Diagnosis not present

## 2017-05-26 DIAGNOSIS — I10 Essential (primary) hypertension: Secondary | ICD-10-CM | POA: Diagnosis not present

## 2017-05-26 DIAGNOSIS — I480 Paroxysmal atrial fibrillation: Secondary | ICD-10-CM

## 2017-05-26 DIAGNOSIS — J9621 Acute and chronic respiratory failure with hypoxia: Secondary | ICD-10-CM | POA: Diagnosis present

## 2017-05-26 LAB — RESPIRATORY PANEL BY PCR
ADENOVIRUS-RVPPCR: NOT DETECTED
Bordetella pertussis: NOT DETECTED
CORONAVIRUS NL63-RVPPCR: NOT DETECTED
CORONAVIRUS OC43-RVPPCR: NOT DETECTED
Chlamydophila pneumoniae: NOT DETECTED
Coronavirus 229E: NOT DETECTED
Coronavirus HKU1: NOT DETECTED
INFLUENZA A-RVPPCR: NOT DETECTED
Influenza B: NOT DETECTED
Metapneumovirus: NOT DETECTED
Mycoplasma pneumoniae: NOT DETECTED
PARAINFLUENZA VIRUS 1-RVPPCR: NOT DETECTED
PARAINFLUENZA VIRUS 3-RVPPCR: NOT DETECTED
PARAINFLUENZA VIRUS 4-RVPPCR: NOT DETECTED
Parainfluenza Virus 2: NOT DETECTED
RHINOVIRUS / ENTEROVIRUS - RVPPCR: NOT DETECTED
Respiratory Syncytial Virus: NOT DETECTED

## 2017-05-26 LAB — BASIC METABOLIC PANEL
Anion gap: 14 (ref 5–15)
BUN: 15 mg/dL (ref 6–20)
CALCIUM: 8.6 mg/dL — AB (ref 8.9–10.3)
CO2: 22 mmol/L (ref 22–32)
CREATININE: 0.9 mg/dL (ref 0.44–1.00)
Chloride: 101 mmol/L (ref 101–111)
GFR calc Af Amer: >60 mL/min (ref >60)
GLUCOSE: 125 mg/dL — AB (ref 65–99)
POTASSIUM: 3.8 mmol/L (ref 3.5–5.1)
Sodium: 137 mmol/L (ref 135–145)

## 2017-05-26 LAB — CBC
HCT: 40.7 % (ref 36.0–46.0)
Hemoglobin: 12.9 g/dL (ref 12.0–15.0)
MCH: 27.6 pg (ref 26.0–34.0)
MCHC: 31.7 g/dL (ref 30.0–36.0)
MCV: 87 fL (ref 78.0–100.0)
PLATELETS: 375 10*3/uL (ref 150–400)
RBC: 4.68 MIL/uL (ref 3.87–5.11)
RDW: 17.3 % — AB (ref 11.5–15.5)
WBC: 24.6 10*3/uL — ABNORMAL HIGH (ref 4.0–10.5)

## 2017-05-26 LAB — INFLUENZA PANEL BY PCR (TYPE A & B)
Influenza A By PCR: NEGATIVE
Influenza B By PCR: NEGATIVE

## 2017-05-26 LAB — MRSA PCR SCREENING: MRSA by PCR: NEGATIVE

## 2017-05-26 LAB — LACTIC ACID, PLASMA: Lactic Acid, Venous: 5.4 mmol/L (ref 0.5–1.9)

## 2017-05-26 LAB — PROCALCITONIN: Procalcitonin: <0.1 ng/mL

## 2017-05-26 LAB — HCG, QUANTITATIVE, PREGNANCY: hCG, Beta Chain, Quant, S: 1 m[IU]/mL (ref <5)

## 2017-05-26 LAB — BRAIN NATRIURETIC PEPTIDE: B Natriuretic Peptide: 33.9 pg/mL (ref 0.0–100.0)

## 2017-05-26 MED ORDER — ZOLPIDEM TARTRATE 5 MG PO TABS
10.0000 mg | ORAL_TABLET | Freq: Every day | ORAL | Status: DC
  Administered 2017-05-26: 10 mg via ORAL
  Filled 2017-05-26: qty 2

## 2017-05-26 MED ORDER — FAMOTIDINE 20 MG PO TABS
20.0000 mg | ORAL_TABLET | Freq: Two times a day (BID) | ORAL | Status: DC
  Administered 2017-05-26 – 2017-06-01 (×13): 20 mg via ORAL
  Filled 2017-05-26 (×13): qty 1

## 2017-05-26 MED ORDER — IPRATROPIUM BROMIDE 0.02 % IN SOLN
0.5000 mg | Freq: Four times a day (QID) | RESPIRATORY_TRACT | Status: DC
  Administered 2017-05-26 (×2): 0.5 mg via RESPIRATORY_TRACT
  Filled 2017-05-26 (×2): qty 2.5

## 2017-05-26 MED ORDER — METOPROLOL TARTRATE 25 MG PO TABS: 100.0000 mg | ORAL_TABLET | Freq: Once | ORAL | Status: DC

## 2017-05-26 MED ORDER — NICOTINE 14 MG/24HR TD PT24
14.0000 mg | MEDICATED_PATCH | Freq: Every day | TRANSDERMAL | Status: DC
  Administered 2017-05-26 – 2017-05-31 (×6): 14 mg via TRANSDERMAL
  Filled 2017-05-26 (×6): qty 1

## 2017-05-26 MED ORDER — ALBUTEROL SULFATE (2.5 MG/3ML) 0.083% IN NEBU
5.0000 mg | INHALATION_SOLUTION | Freq: Once | RESPIRATORY_TRACT | Status: AC
  Administered 2017-05-26: 5 mg via RESPIRATORY_TRACT
  Filled 2017-05-26: qty 6

## 2017-05-26 MED ORDER — BUSPIRONE HCL 15 MG PO TABS
7.5000 mg | ORAL_TABLET | Freq: Two times a day (BID) | ORAL | Status: DC
  Administered 2017-05-26 – 2017-06-01 (×13): 7.5 mg via ORAL
  Filled 2017-05-26 (×14): qty 1

## 2017-05-26 MED ORDER — FUROSEMIDE 20 MG PO TABS
20.0000 mg | ORAL_TABLET | Freq: Every day | ORAL | Status: DC
  Administered 2017-05-26 – 2017-05-27 (×2): 20 mg via ORAL
  Filled 2017-05-26 (×2): qty 1

## 2017-05-26 MED ORDER — ALBUTEROL SULFATE (2.5 MG/3ML) 0.083% IN NEBU
2.5000 mg | INHALATION_SOLUTION | RESPIRATORY_TRACT | Status: DC | PRN
  Administered 2017-05-26 – 2017-05-28 (×4): 2.5 mg via RESPIRATORY_TRACT
  Filled 2017-05-26 (×4): qty 3

## 2017-05-26 MED ORDER — FLUTICASONE PROPIONATE 50 MCG/ACT NA SUSP
2.0000 | Freq: Every day | NASAL | Status: DC
  Administered 2017-05-26 – 2017-06-01 (×6): 2 via NASAL
  Filled 2017-05-26 (×2): qty 16

## 2017-05-26 MED ORDER — PANTOPRAZOLE SODIUM 40 MG PO TBEC
40.0000 mg | DELAYED_RELEASE_TABLET | Freq: Every day | ORAL | Status: DC
  Administered 2017-05-26 – 2017-06-01 (×7): 40 mg via ORAL
  Filled 2017-05-26 (×7): qty 1

## 2017-05-26 MED ORDER — METHYLPREDNISOLONE SODIUM SUCC 125 MG IJ SOLR
60.0000 mg | Freq: Two times a day (BID) | INTRAMUSCULAR | Status: DC
  Administered 2017-05-26 – 2017-05-27 (×2): 60 mg via INTRAVENOUS
  Filled 2017-05-26 (×2): qty 2

## 2017-05-26 MED ORDER — ACETAMINOPHEN 325 MG PO TABS: 650.0000 mg | ORAL_TABLET | Freq: Four times a day (QID) | ORAL | Status: DC | PRN

## 2017-05-26 MED ORDER — HYDRALAZINE HCL 20 MG/ML IJ SOLN
10.0000 mg | INTRAMUSCULAR | Status: AC
  Administered 2017-05-26: 10 mg via INTRAVENOUS
  Filled 2017-05-26: qty 1

## 2017-05-26 MED ORDER — LEVALBUTEROL HCL 1.25 MG/0.5ML IN NEBU: 1.2500 mg | INHALATION_SOLUTION | Freq: Four times a day (QID) | RESPIRATORY_TRACT | Status: DC

## 2017-05-26 MED ORDER — IPRATROPIUM BROMIDE 0.02 % IN SOLN: 1.0000 mg | RESPIRATORY_TRACT | Status: DC

## 2017-05-26 MED ORDER — AMLODIPINE BESYLATE 5 MG PO TABS: 10.0000 mg | ORAL_TABLET | Freq: Once | ORAL | Status: DC

## 2017-05-26 MED ORDER — AZITHROMYCIN 250 MG PO TABS
500.0000 mg | ORAL_TABLET | Freq: Every day | ORAL | Status: AC
  Administered 2017-05-26: 500 mg via ORAL
  Filled 2017-05-26: qty 2

## 2017-05-26 MED ORDER — ACETAMINOPHEN 325 MG PO TABS
650.0000 mg | ORAL_TABLET | Freq: Four times a day (QID) | ORAL | Status: DC | PRN
  Administered 2017-05-27 – 2017-05-29 (×2): 650 mg via ORAL
  Filled 2017-05-26 (×2): qty 2

## 2017-05-26 MED ORDER — METOPROLOL TARTRATE 100 MG PO TABS
100.0000 mg | ORAL_TABLET | Freq: Two times a day (BID) | ORAL | Status: DC
  Administered 2017-05-26 – 2017-06-01 (×13): 100 mg via ORAL
  Filled 2017-05-26: qty 1
  Filled 2017-05-26: qty 4
  Filled 2017-05-26 (×11): qty 1

## 2017-05-26 MED ORDER — AMLODIPINE BESYLATE 10 MG PO TABS
10.0000 mg | ORAL_TABLET | Freq: Every day | ORAL | Status: DC
  Administered 2017-05-26 – 2017-05-29 (×4): 10 mg via ORAL
  Filled 2017-05-26 (×3): qty 1
  Filled 2017-05-26: qty 2
  Filled 2017-05-26: qty 1

## 2017-05-26 MED ORDER — ALBUTEROL SULFATE (2.5 MG/3ML) 0.083% IN NEBU
2.5000 mg | INHALATION_SOLUTION | Freq: Four times a day (QID) | RESPIRATORY_TRACT | Status: DC
  Administered 2017-05-26 (×2): 2.5 mg via RESPIRATORY_TRACT
  Filled 2017-05-26: qty 3

## 2017-05-26 MED ORDER — BUDESONIDE 0.25 MG/2ML IN SUSP
0.2500 mg | Freq: Two times a day (BID) | RESPIRATORY_TRACT | Status: DC
  Administered 2017-05-26 – 2017-06-01 (×11): 0.25 mg via RESPIRATORY_TRACT
  Filled 2017-05-26 (×14): qty 2

## 2017-05-26 MED ORDER — SERTRALINE HCL 100 MG PO TABS
100.0000 mg | ORAL_TABLET | Freq: Every day | ORAL | Status: DC
  Administered 2017-05-26 – 2017-06-01 (×7): 100 mg via ORAL
  Filled 2017-05-26 (×7): qty 1

## 2017-05-26 MED ORDER — SPIRONOLACTONE 100 MG PO TABS: 100.0000 mg | ORAL_TABLET | Freq: Every day | ORAL | Status: DC

## 2017-05-26 MED ORDER — HYDRALAZINE HCL 20 MG/ML IJ SOLN: 5.0000 mg | INTRAMUSCULAR | Status: DC | PRN

## 2017-05-26 MED ORDER — ZOLPIDEM TARTRATE 5 MG PO TABS
5.0000 mg | ORAL_TABLET | Freq: Every evening | ORAL | Status: DC | PRN
  Administered 2017-05-27 – 2017-05-28 (×2): 5 mg via ORAL
  Filled 2017-05-26 (×2): qty 1

## 2017-05-26 MED ORDER — MONTELUKAST SODIUM 10 MG PO TABS
10.0000 mg | ORAL_TABLET | Freq: Every day | ORAL | Status: DC
  Administered 2017-05-26 – 2017-05-31 (×6): 10 mg via ORAL
  Filled 2017-05-26 (×7): qty 1

## 2017-05-26 MED ORDER — IPRATROPIUM BROMIDE 0.02 % IN SOLN
1.0000 mg | Freq: Once | RESPIRATORY_TRACT | Status: AC
  Administered 2017-05-26: 1 mg via RESPIRATORY_TRACT
  Filled 2017-05-26: qty 5

## 2017-05-26 MED ORDER — METHYLPREDNISOLONE SODIUM SUCC 125 MG IJ SOLR
60.0000 mg | Freq: Three times a day (TID) | INTRAMUSCULAR | Status: DC
  Administered 2017-05-26: 60 mg via INTRAVENOUS
  Filled 2017-05-26: qty 2

## 2017-05-26 MED ORDER — LOSARTAN POTASSIUM 50 MG PO TABS
100.0000 mg | ORAL_TABLET | Freq: Every day | ORAL | Status: DC
  Administered 2017-05-26 – 2017-05-29 (×4): 100 mg via ORAL
  Filled 2017-05-26 (×4): qty 2

## 2017-05-26 MED ORDER — ONDANSETRON HCL 4 MG/2ML IJ SOLN
4.0000 mg | Freq: Three times a day (TID) | INTRAMUSCULAR | Status: DC | PRN
Start: 2017-05-26 — End: 2017-06-01

## 2017-05-26 MED ORDER — MAGNESIUM SULFATE 2 GM/50ML IV SOLN
2.0000 g | Freq: Once | INTRAVENOUS | Status: AC
  Administered 2017-05-26: 2 g via INTRAVENOUS
  Filled 2017-05-26: qty 50

## 2017-05-26 MED ORDER — TRAMADOL HCL 50 MG PO TABS
50.0000 mg | ORAL_TABLET | Freq: Four times a day (QID) | ORAL | Status: DC | PRN
  Administered 2017-05-26 – 2017-05-31 (×11): 50 mg via ORAL
  Filled 2017-05-26 (×11): qty 1

## 2017-05-26 MED ORDER — ALBUTEROL (5 MG/ML) CONTINUOUS INHALATION SOLN
10.0000 mg | INHALATION_SOLUTION | RESPIRATORY_TRACT | Status: DC
  Administered 2017-05-26: 10 mg via RESPIRATORY_TRACT
  Filled 2017-05-26: qty 20

## 2017-05-26 MED ORDER — IPRATROPIUM BROMIDE 0.02 % IN SOLN
0.5000 mg | Freq: Once | RESPIRATORY_TRACT | Status: AC
  Administered 2017-05-26: 0.5 mg via RESPIRATORY_TRACT
  Filled 2017-05-26: qty 2.5

## 2017-05-26 MED ORDER — IPRATROPIUM-ALBUTEROL 0.5-2.5 (3) MG/3ML IN SOLN
3.0000 mL | Freq: Four times a day (QID) | RESPIRATORY_TRACT | Status: DC
  Administered 2017-05-26 – 2017-05-29 (×10): 3 mL via RESPIRATORY_TRACT
  Filled 2017-05-26 (×10): qty 3

## 2017-05-26 MED ORDER — FUROSEMIDE 20 MG PO TABS: 40.0000 mg | ORAL_TABLET | Freq: Every day | ORAL | Status: DC

## 2017-05-26 MED ORDER — SODIUM CHLORIDE 0.9 % IV BOLUS (SEPSIS)
1000.0000 mL | Freq: Once | INTRAVENOUS | Status: AC
  Administered 2017-05-26: 1000 mL via INTRAVENOUS

## 2017-05-26 MED ORDER — AZITHROMYCIN 250 MG PO TABS
250.0000 mg | ORAL_TABLET | Freq: Every day | ORAL | Status: AC
  Administered 2017-05-27 – 2017-05-30 (×4): 250 mg via ORAL
  Filled 2017-05-26 (×5): qty 1

## 2017-05-26 MED ORDER — ALBUTEROL SULFATE (2.5 MG/3ML) 0.083% IN NEBU
5.0000 mg | INHALATION_SOLUTION | Freq: Once | RESPIRATORY_TRACT | Status: DC
  Filled 2017-05-26: qty 6

## 2017-05-26 MED ORDER — DM-GUAIFENESIN ER 30-600 MG PO TB12
1.0000 | ORAL_TABLET | Freq: Two times a day (BID) | ORAL | Status: DC | PRN
Start: 2017-05-26 — End: 2017-05-29

## 2017-05-26 MED ORDER — ENOXAPARIN SODIUM 80 MG/0.8ML ~~LOC~~ SOLN
75.0000 mg | SUBCUTANEOUS | Status: DC
  Administered 2017-05-26 – 2017-05-28 (×3): 75 mg via SUBCUTANEOUS
  Filled 2017-05-26 (×4): qty 0.8

## 2017-05-26 MED ORDER — BUTALBITAL-APAP-CAFFEINE 50-325-40 MG PO TABS: 1.0000 | ORAL_TABLET | Freq: Four times a day (QID) | ORAL | Status: DC | PRN

## 2017-05-26 MED ORDER — METHYLPREDNISOLONE SODIUM SUCC 125 MG IJ SOLR
125.0000 mg | Freq: Once | INTRAMUSCULAR | Status: AC
  Administered 2017-05-26: 125 mg via INTRAVENOUS
  Filled 2017-05-26: qty 2

## 2017-05-26 MED ORDER — ALBUTEROL SULFATE (2.5 MG/3ML) 0.083% IN NEBU
INHALATION_SOLUTION | RESPIRATORY_TRACT | Status: AC
  Administered 2017-05-26: 2.5 mg via RESPIRATORY_TRACT
  Filled 2017-05-26: qty 3

## 2017-05-26 MED ORDER — LORATADINE 10 MG PO TABS
10.0000 mg | ORAL_TABLET | Freq: Every day | ORAL | Status: DC
  Administered 2017-05-26 – 2017-06-01 (×7): 10 mg via ORAL
  Filled 2017-05-26 (×7): qty 1

## 2017-05-26 NOTE — ED Notes (Signed)
Patient refused bedside commode  States she was walking to the bathroom. I tried to explain to patient that she has gotten increased sob , patient refused and walked to bathroom

## 2017-05-26 NOTE — ED Notes (Signed)
Pt still wheezing and sob after breathing treatment.

## 2017-05-26 NOTE — H&P (Signed)
History and Physical    April [name] CWC:[id number] DOB: August 09, 1970 DOA: 05/26/2017  Referring MD/NP/PA:   PCP: April Rakes, MD   Patient coming from:  The patient is coming from home.  At baseline, pt is independent for most of ADL.   Chief Complaint: Cough and shortness of breath  HPI: April [name] is a 47 y.o. female with medical history significant of COPD, asthma, hypertension, GERD, depression, tobacco abuse, OSA on CPAP, CHF, who presents with cough and shortness of breath.  Patient states that she has been having cough and shortness of breath in the past 4 days, which has been progressively getting worse. She reports fever or 101 and chills. Denies chest pain, runny nose or sore throat. Patient can barely speak in full sentence. Patient denies nausea, vomiting, diarrhea, abdominal pain, symptoms of UTI or unilateral weakness. No tenderness in the areas. Patient is started with BiPAP in ED.  ED Course: pt was found to have WBC 24.6, electrolytes renal function okay, blood pressure 180/98,  temperature normal, tachycardia, tachypnea, negative chest x-ray. Patient is admitted to stepdown as inpatient.  Review of Systems:   General: has fevers, chills, no body weight gain, has fatigue HEENT: no blurry vision, hearing changes or sore throat Respiratory: has dyspnea, coughing, wheezing CV: no chest pain, no palpitations GI: no nausea, vomiting, abdominal pain, diarrhea, constipation GU: no dysuria, burning on urination, increased urinary frequency, hematuria  Ext: no leg edema Neuro: no unilateral weakness, numbness, or tingling, no vision change or hearing loss Skin: no rash, no skin tear. MSK: No muscle spasm, no deformity, no limitation of range of movement in spin Heme: No easy bruising.  Travel history: No recent long distant travel.  Allergy: No Known Allergies  Past Medical History:  Diagnosis Date  . Acanthosis nigricans   . Anxiety   . Arthritis    "knees"  (04/28/2017)  . Asthma    Followed by Dr. Annamaria Boots (pulmonology); receives every other week omalizumab injections; has frequent exacerbations  . COPD (chronic obstructive pulmonary disease) ([place])    PFTs in 2002, FEV1/FVC 65, no post bronchodilater test done  . Depression   . GERD (gastroesophageal reflux disease)   . GERD (gastroesophageal reflux disease)   . Headache(784.0)    "q couple days" (04/28/2017)  . Helicobacter pylori (H. pylori) infection   . Hypertension, essential   . Insomnia   . Menorrhagia   . Morbid obesity ([place])   . Obesity   . OSA on CPAP    Sleep study 2008 - mild OSA, not enough events to titrate CPAP; wears CPAP now/pt on 04/28/2017  . Pneumonia X 1  . Seasonal allergies   . Shortness of breath   . Tobacco user     Past Surgical History:  Procedure Laterality Date  . REDUCTION MAMMAPLASTY Bilateral 09/2011  . TUBAL LIGATION  1996   bilateral    Social History:  reports that she quit smoking about 2 years ago. Her smoking use included cigarettes. She has a 13.00 pack-year smoking history. she has never used smokeless tobacco. She reports that she does not drink alcohol or use drugs.  Family History:  Family History  Problem Relation Age of Onset  . Hypertension Mother   . Asthma Daughter   . Cancer Paternal Aunt   . Asthma Maternal Grandmother      Prior to Admission medications   Medication Sig Start Date End Date Taking? Authorizing Provider  albuterol (PROVENTIL HFA;VENTOLIN HFA) 108 (90  Base) MCG/ACT inhaler Inhale 2 puffs every 4 (four) hours as needed into the lungs for wheezing or shortness of breath. 03/12/17  Yes April Rakes, MD  amLODipine (NORVASC) 10 MG tablet Take 1 tablet (10 mg total) daily by mouth. 03/12/17  Yes Newlin, Enobong, MD  arformoterol (BROVANA) 15 MCG/2ML NEBU Take 2 mLs (15 mcg total) 2 (two) times daily by nebulization. 03/12/17  Yes Newlin, Enobong, MD  budesonide (PULMICORT) 0.25 MG/2ML nebulizer solution Take 2  mLs (0.25 mg total) 2 (two) times daily by nebulization. 03/12/17  Yes April Rakes, MD  busPIRone (BUSPAR) 7.5 MG tablet Take 1 tablet (7.5 mg total) 2 (two) times daily by mouth. 03/12/17  Yes April Rakes, MD  butalbital-acetaminophen-caffeine (FIORICET, ESGIC) 50-325-40 MG tablet Take 1-2 tablets by mouth every 6 (six) hours as needed for headache. 12/19/16 12/19/17 Yes Joy, Shawn C, PA-C  famotidine (PEPCID) 20 MG tablet Take 1 tablet (20 mg total) by mouth 2 (two) times daily. 03/29/17  Yes Ward, Ozella Almond, PA-C  fluticasone (FLONASE) 50 MCG/ACT nasal spray Place 2 sprays into both nostrils daily. 12/13/16  Yes Rai, Ripudeep K, MD  furosemide (LASIX) 40 MG tablet Take 1 tablet (40 mg total) daily by mouth. 03/12/17 03/12/18 Yes Newlin, Enobong, MD  loratadine (CLARITIN) 10 MG tablet Take 1 tablet (10 mg total) by mouth daily. 08/30/15  Yes [name], MD  losartan (COZAAR) 100 MG tablet Take 1 tablet (100 mg total) daily by mouth. 03/12/17  Yes Newlin, Enobong, MD  metoprolol tartrate (LOPRESSOR) 100 MG tablet Take 1 tablet (100 mg total) by mouth 2 (two) times daily. 02/05/17  Yes Newlin, Charlane Ferretti, MD  montelukast (SINGULAIR) 10 MG tablet Take 1 tablet (10 mg total) at bedtime by mouth. 03/12/17  Yes Newlin, Enobong, MD  nicotine (NICODERM CQ - DOSED IN MG/24 HOURS) 14 mg/24hr patch Place 1 patch (14 mg total) onto the skin daily. 10/04/16  Yes Reyne Dumas, MD  omeprazole (PRILOSEC) 20 MG capsule Take 1 capsule (20 mg total) by mouth 2 (two) times daily. 04/30/17  Yes Geradine Girt, DO  predniSONE (DELTASONE) 20 MG tablet Take 2 tablets (40 mg total) by mouth daily with breakfast. 05/01/17  Yes Eliseo Squires, Jessica U, DO  sertraline (ZOLOFT) 100 MG tablet Take 1 tablet (100 mg total) daily by mouth. 03/12/17  Yes Newlin, Enobong, MD  spironolactone (ALDACTONE) 100 MG tablet Take 1 tablet (100 mg total) daily by mouth. 03/12/17  Yes Newlin, Enobong, MD  zolpidem (AMBIEN) 10 MG tablet Take 10 mg by  mouth at bedtime. 09/27/10  Yes [provider]  doxycycline (VIBRA-TABS) 100 MG tablet Take 1 tablet (100 mg total) by mouth every 12 (twelve) hours. Patient not taking: Reported on 05/26/2017 04/30/17   Geradine Girt, DO  predniSONE (DELTASONE) 10 MG tablet 4 X 2 DAYS, 3 X 2 DAYS, 2 X 2 DAYS, 1 X 2 DAYS Patient not taking: Reported on 05/26/2017 05/02/17   Deneise Lever, MD    Physical Exam: Vitals:   05/26/17 4665 05/26/17 0500 05/26/17 0530 05/26/17 0550  BP:  (!) 180/98 (!) 180/111 (!) 171/99  Pulse: (!) 103     Resp: (!) 23 (!) 26 19 20   Temp:      TempSrc:      SpO2: 98%     Weight:      Height:       General: in acute respiratory distress HEENT:       Eyes: PERRL, EOMI, no scleral icterus.  ENT: No discharge from the ears and nose, no pharynx injection, no tonsillar enlargement.        Neck: No JVD, no bruit, no mass felt. Heme: No neck lymph node enlargement. Cardiac: S1/S2, RRR, No murmurs, No gallops or rubs. Respiratory: Has wheezing bilaterally.  GI: Soft, nondistended, nontender, no rebound pain, no organomegaly, BS present. GU: No hematuria Ext: No pitting leg edema bilaterally. 2+DP/PT pulse bilaterally. Musculoskeletal: No joint deformities, No joint redness or warmth, no limitation of ROM in spin. Skin: No rashes.  Neuro: Alert, oriented X3, cranial nerves II-XII grossly intact, moves all extremities normally.  Psych: Patient is not psychotic, no suicidal or hemocidal ideation.  Labs on Admission: I have personally reviewed following labs and imaging studies  CBC: Recent Labs  Lab 05/26/17 0316  WBC 24.6*  HGB 12.9  HCT 40.7  MCV 87.0  PLT 789   Basic Metabolic Panel: Recent Labs  Lab 05/26/17 0316  NA 137  K 3.8  CL 101  CO2 22  GLUCOSE 125*  BUN 15  CREATININE 0.90  CALCIUM 8.6*   GFR: Estimated Creatinine Clearance: 119.3 mL/min (by C-G formula based on SCr of 0.9 mg/dL). Liver Function Tests: No results for input(s):  AST, ALT, ALKPHOS, BILITOT, PROT, ALBUMIN in the last 168 hours. No results for input(s): LIPASE, AMYLASE in the last 168 hours. No results for input(s): AMMONIA in the last 168 hours. Coagulation Profile: No results for input(s): INR, PROTIME in the last 168 hours. Cardiac Enzymes: No results for input(s): CKTOTAL, CKMB, CKMBINDEX, TROPONINI in the last 168 hours. BNP (last 3 results) No results for input(s): PROBNP in the last 8760 hours. HbA1C: No results for input(s): HGBA1C in the last 72 hours. CBG: No results for input(s): GLUCAP in the last 168 hours. Lipid Profile: No results for input(s): CHOL, HDL, LDLCALC, TRIG, CHOLHDL, LDLDIRECT in the last 72 hours. Thyroid Function Tests: No results for input(s): TSH, T4TOTAL, FREET4, T3FREE, THYROIDAB in the last 72 hours. Anemia Panel: No results for input(s): VITAMINB12, FOLATE, FERRITIN, TIBC, IRON, RETICCTPCT in the last 72 hours. Urine analysis:    Component Value Date/Time   COLORURINE YELLOW 04/29/2017 0519   APPEARANCEUR HAZY (A) 04/29/2017 0519   LABSPEC 1.008 04/29/2017 0519   PHURINE 6.0 04/29/2017 0519   GLUCOSEU NEGATIVE 04/29/2017 0519   GLUCOSEU NEG mg/dL 10/28/2007 2049   HGBUR NEGATIVE 04/29/2017 0519   BILIRUBINUR NEGATIVE 04/29/2017 0519   KETONESUR NEGATIVE 04/29/2017 0519   PROTEINUR NEGATIVE 04/29/2017 0519   UROBILINOGEN 1.0 11/21/2014 0707   NITRITE NEGATIVE 04/29/2017 0519   LEUKOCYTESUR NEGATIVE 04/29/2017 0519   Sepsis Labs: @LABRCNTIP (procalcitonin:4,lacticidven:4) )No results found for this or any previous visit (from the past 240 hour(s)).   Radiological Exams on Admission: Dg Chest 2 View  Result Date: 05/26/2017 CLINICAL DATA:  Reports asthma flair for a couple of days worse today. No relief using inhaler at home. Also reports non productive cough. EXAM: CHEST  2 VIEW COMPARISON:  Chest x-ray dated 04/28/2017. FINDINGS: Heart size and mediastinal contours are within normal limits. Lungs are  clear. No pleural effusion or pneumothorax seen. Osseous structures about the chest are unremarkable. IMPRESSION: No active cardiopulmonary disease. No evidence of pneumonia or pulmonary edema. Electronically Signed   By: Franki Cabot M.D.   On: 05/26/2017 01:14     EKG: Independently reviewed. Sinus rhythm, QTC 452, LAD, poor R-wave progression, right atrial enlargement.  Assessment/Plan Principal Problem:   Acute on chronic respiratory failure with hypoxia (HCC) Active  Problems:   Morbid obesity with body mass index of 50.0-59.9 in adult [facility])   Major depressive disorder, recurrent episode ([place])   Essential hypertension   GERD (gastroesophageal reflux disease)   Hypertensive urgency   Asthma exacerbation in COPD (HCC)   Chronic diastolic CHF (congestive heart failure) (HCC)   OSA (obstructive sleep apnea)   COPD with acute exacerbation (HCC)   Sepsis ([place])   Acute on chronic respiratory failure with hypoxia due to COPD/asthma exacerbation and sepsis: Patient meets criteria for sepsis with leukocytosis, tachycardia and tachypnea. Pending lactic acid.  -will admit patient to SDU as inpt -on BiPAP -Nebulizers: scheduled Atrovent and prn Xopenex Nebs -Continue Singulair and Pulmicort -Solu-Medrol 60 mg IV tid -Z pak -Mucinex for cough  -Incentive spirometry -Urine S. pneumococcal antigen -Follow up blood culture x2, sputum culture, respiratory virus panel, Flu pcr -Nasal cannula oxygen as needed to maintain O2 saturation 92% or greater --will get Procalcitonin and trend lactic acid levels per sepsis protocol. -will give 1L NS, and f/u lactic acid level-->if elevated, will need to give more IVF (patient has congestive heart failure, limiting aggressive IV fluids treatment).  HTN and Hypertensive urgency: Bp 180/98. No CP -Continue home medications: Losartan, metoprolol -IV hydralazine prn  GERD: -Protonix -Pepcid   Depression: Stable, no suicidal or homicidal  ideations. -Continue home medications: Zoloft, BuSpar OSA: on CPAP at home -on BiPAP now  Chronic diastolic CHF (congestive heart failure) ([place]): 2-D echo on 10/03/16 showed EF of 55-60% with grade 1 diastolic dysfunction. Patient does not have leg edema. No pulmonary edema on chest x-ray. CHF is compensated. -Hold Lasix and spironolactone due to sepsis -Continue metoprolol  Tobacco abuse: -Nicotine patch   DVT ppx: SQ Lovenox Code Status: Full code Family Communication: None at bed side.     Disposition Plan:  Anticipate discharge back to previous home environment Consults called:  none Admission status:  SDU/inpation       Date of Service 05/26/2017    [name] [facility] Pager [phone]  If 7PM-7AM, please contact night-coverage [email or web address] Password [facility] 05/26/2017, 6:46 AM

## 2017-05-26 NOTE — ED Provider Notes (Signed)
[facility] EMERGENCY DEPARTMENT Provider Note   CSN: [id number] Arrival date & time: 05/26/17  0002     History   Chief Complaint Chief Complaint  Patient presents with  . Shortness of Breath    HPI April [name] is a 47 y.o. female with a hx of asthma, obesity, insomnia, COPD, tobacco use, hypertension, depression, GERD presents to the Emergency Department complaining of gradual, persistent, progressively worsening of breath onset 2-4 days ago.  Patient reports she has had worsening wheezing and shortness of breath at home.  She has been using home Dulera and nebulizers without relief.  She reports given albuterol here in the emergency department upon arrival without significant improvement.  She reports numerous hospitalizations for her asthma but she has never been intubated.  She reports recent URI symptoms with nasal congestion and cough but denies fevers or chills.  No abdominal pain, nausea or vomiting.  Report exertion seems to make her shortness of breath worse.  She denies history of DVT, leg swelling, birth control, chest pain.    The history is provided by the patient and medical records. No language interpreter was used.    Past Medical History:  Diagnosis Date  . Acanthosis nigricans   . Anxiety   . Arthritis    "knees" (04/28/2017)  . Asthma    Followed by Dr. Annamaria Boots (pulmonology); receives every other week omalizumab injections; has frequent exacerbations  . COPD (chronic obstructive pulmonary disease) ([place])    PFTs in 2002, FEV1/FVC 65, no post bronchodilater test done  . Depression   . GERD (gastroesophageal reflux disease)   . GERD (gastroesophageal reflux disease)   . Headache(784.0)    "q couple days" (04/28/2017)  . Helicobacter pylori (H. pylori) infection   . Hypertension, essential   . Insomnia   . Menorrhagia   . Morbid obesity ([place])   . Obesity   . OSA on CPAP    Sleep study 2008 - mild OSA, not enough events to titrate CPAP;  wears CPAP now/pt on 04/28/2017  . Pneumonia X 1  . Seasonal allergies   . Shortness of breath   . Tobacco user     Patient Active Problem List   Diagnosis Date Noted  . Acute on chronic respiratory failure with hypoxia ([place]) 05/26/2017  . Sepsis ([place]) 05/26/2017  . Acute asthma exacerbation 02/15/2017  . COPD with acute exacerbation ([place]) 02/10/2017  . Dyspnea 02/04/2017  . Asthma 02/04/2017  . Hypertension, essential 02/04/2017  . Sleep apnea 02/04/2017  . Morbid obesity with BMI of 50.0-59.9, adult ([place]) 02/04/2017  . Chronic diastolic heart failure, NYHA class 1 ([place]) 02/04/2017  . Acute hyperglycemia 02/04/2017  . Chronic diastolic CHF (congestive heart failure) ([place]) 01/17/2017  . SIRS (systemic inflammatory response syndrome) ([place]) 01/17/2017  . Depression 01/17/2017  . OSA (obstructive sleep apnea) 01/17/2017  . Asthma exacerbation 01/17/2017  . SOB (shortness of breath)   . Asthma exacerbation in COPD ([place]) 12/10/2016  . Moderate persistent asthma with exacerbation 10/19/2016  . Diarrhea 10/19/2016  . Normocytic anemia 10/02/2016  . Respiratory failure ([place]) 10/02/2016  . Hypertensive urgency, malignant 06/22/2016  . Pulmonary edema 06/22/2016  . Hypertensive urgency 06/22/2016  . Malignant hypertension due to primary aldosteronism ([place]) 05/05/2016  . Lip laceration 05/05/2016  . Elevated hemoglobin A1c 05/05/2016  . GERD (gastroesophageal reflux disease) 08/30/2015  . Anxiety and depression 08/30/2015  . Prediabetes 08/22/2015  . Seasonal allergic rhinitis 08/29/2013  . Leukocytosis 10/21/2012  . Tobacco abuse  10/07/2012  . Asthma, chronic obstructive, without status asthmaticus ([place]) 05/07/2012  . Knee pain, bilateral 04/25/2011  . Primary insomnia 03/14/2011  . Mild obstructive sleep apnea 12/19/2010  . Hypokalemia 08/13/2010  . Cervical back pain with evidence of disc disease 04/08/2008  . Essential hypertension 07/31/2006  . Morbid obesity with body  mass index of 50.0-59.9 in adult ([place]) 06/17/2006  . Major depressive disorder, recurrent episode ([place]) 04/10/2006    Past Surgical History:  Procedure Laterality Date  . REDUCTION MAMMAPLASTY Bilateral 09/2011  . TUBAL LIGATION  1996   bilateral    OB History    No data available       Home Medications    Prior to Admission medications   Medication Sig Start Date End Date Taking? Authorizing Provider  albuterol (PROVENTIL HFA;VENTOLIN HFA) 108 (90 Base) MCG/ACT inhaler Inhale 2 puffs every 4 (four) hours as needed into the lungs for wheezing or shortness of breath. 03/12/17  Yes [name], MD  amLODipine (NORVASC) 10 MG tablet Take 1 tablet (10 mg total) daily by mouth. 03/12/17  Yes Newlin, Enobong, MD  arformoterol (BROVANA) 15 MCG/2ML NEBU Take 2 mLs (15 mcg total) 2 (two) times daily by nebulization. 03/12/17  Yes Newlin, Enobong, MD  budesonide (PULMICORT) 0.25 MG/2ML nebulizer solution Take 2 mLs (0.25 mg total) 2 (two) times daily by nebulization. 03/12/17  Yes [name], MD  busPIRone (BUSPAR) 7.5 MG tablet Take 1 tablet (7.5 mg total) 2 (two) times daily by mouth. 03/12/17  Yes [name], MD  butalbital-acetaminophen-caffeine (FIORICET, ESGIC) 50-325-40 MG tablet Take 1-2 tablets by mouth every 6 (six) hours as needed for headache. 12/19/16 12/19/17 Yes Joy, Shawn C, PA-C  famotidine (PEPCID) 20 MG tablet Take 1 tablet (20 mg total) by mouth 2 (two) times daily. 03/29/17  Yes Ward, Ozella Almond, PA-C  fluticasone (FLONASE) 50 MCG/ACT nasal spray Place 2 sprays into both nostrils daily. 12/13/16  Yes Rai, Ripudeep K, MD  furosemide (LASIX) 40 MG tablet Take 1 tablet (40 mg total) daily by mouth. 03/12/17 03/12/18 Yes Newlin, Enobong, MD  loratadine (CLARITIN) 10 MG tablet Take 1 tablet (10 mg total) by mouth daily. 08/30/15  Yes [name], MD  losartan (COZAAR) 100 MG tablet Take 1 tablet (100 mg total) daily by mouth. 03/12/17  Yes Newlin, Enobong, MD    metoprolol tartrate (LOPRESSOR) 100 MG tablet Take 1 tablet (100 mg total) by mouth 2 (two) times daily. 02/05/17  Yes Newlin, Charlane Ferretti, MD  montelukast (SINGULAIR) 10 MG tablet Take 1 tablet (10 mg total) at bedtime by mouth. 03/12/17  Yes Newlin, Enobong, MD  nicotine (NICODERM CQ - DOSED IN MG/24 HOURS) 14 mg/24hr patch Place 1 patch (14 mg total) onto the skin daily. 10/04/16  Yes Reyne Dumas, MD  omeprazole (PRILOSEC) 20 MG capsule Take 1 capsule (20 mg total) by mouth 2 (two) times daily. 04/30/17  Yes Geradine Girt, DO  predniSONE (DELTASONE) 20 MG tablet Take 2 tablets (40 mg total) by mouth daily with breakfast. 05/01/17  Yes Eliseo Squires, Jessica U, DO  sertraline (ZOLOFT) 100 MG tablet Take 1 tablet (100 mg total) daily by mouth. 03/12/17  Yes Newlin, Enobong, MD  spironolactone (ALDACTONE) 100 MG tablet Take 1 tablet (100 mg total) daily by mouth. 03/12/17  Yes Newlin, Enobong, MD  zolpidem (AMBIEN) 10 MG tablet Take 10 mg by mouth at bedtime. 09/27/10  Yes [provider]  doxycycline (VIBRA-TABS) 100 MG tablet Take 1 tablet (100 mg total) by mouth  every 12 (twelve) hours. Patient not taking: Reported on 05/26/2017 04/30/17   Geradine Girt, DO  predniSONE (DELTASONE) 10 MG tablet 4 X 2 DAYS, 3 X 2 DAYS, 2 X 2 DAYS, 1 X 2 DAYS Patient not taking: Reported on 05/26/2017 05/02/17   Deneise Lever, MD    Family History Family History  Problem Relation Age of Onset  . Hypertension Mother   . Asthma Daughter   . Cancer Paternal Aunt   . Asthma Maternal Grandmother     Social History Social History   Tobacco Use  . Smoking status: Former Smoker    Packs/day: 0.50    Years: 26.00    Pack years: 13.00    Types: Cigarettes    Last attempt to quit: 09/12/2014    Years since quitting: 2.7  . Smokeless tobacco: Never Used  Substance Use Topics  . Alcohol use: No  . Drug use: No     Allergies   Patient has no known allergies.   Review of Systems Review of Systems   Constitutional: Negative for appetite change, diaphoresis, fatigue, fever and unexpected weight change.  HENT: Negative for mouth sores.   Eyes: Negative for visual disturbance.  Respiratory: Positive for chest tightness, shortness of breath and wheezing. Negative for cough.   Cardiovascular: Negative for chest pain.  Gastrointestinal: Negative for abdominal pain, constipation, diarrhea, nausea and vomiting.  Endocrine: Negative for polydipsia, polyphagia and polyuria.  Genitourinary: Negative for dysuria, frequency, hematuria and urgency.  Musculoskeletal: Negative for back pain and neck stiffness.  Skin: Negative for rash.  Allergic/Immunologic: Negative for immunocompromised state.  Neurological: Negative for syncope, light-headedness and headaches.  Hematological: Does not bruise/bleed easily.  Psychiatric/Behavioral: Negative for sleep disturbance. The patient is not nervous/anxious.      Physical Exam Updated Vital Signs BP (!) 173/97   Pulse 99   Temp 98.8 F (37.1 C) (Oral)   Resp (!) 23   Ht 5\' 6"  (1.676 m)   Wt (!) 147.9 kg (326 lb)   SpO2 100%   BMI 52.62 kg/m   Physical Exam  Constitutional: She appears well-developed and well-nourished. She appears distressed.  Awake, alert, nontoxic appearance  HENT:  Head: Normocephalic and atraumatic.  Mouth/Throat: Oropharynx is clear and moist. No oropharyngeal exudate.  Eyes: Conjunctivae are normal. No scleral icterus.  Neck: Normal range of motion. Neck supple.  Cardiovascular: Normal rate, regular rhythm and intact distal pulses.  Pulmonary/Chest: Accessory muscle usage present. Tachypnea noted. She is in respiratory distress. She has decreased breath sounds. She has wheezes. She has no rhonchi. She has no rales.  Equal chest expansion Moderate respiratory distress  Abdominal: Soft. Bowel sounds are normal. She exhibits no mass. There is no tenderness. There is no rebound and no guarding.  Musculoskeletal: Normal  range of motion. She exhibits no edema.  Neurological: She is alert.  Speech is clear and goal oriented Moves extremities without ataxia  Skin: Skin is warm and dry. She is not diaphoretic.  Psychiatric: She has a normal mood and affect.  Nursing note and vitals reviewed.    ED Treatments / Results  Labs (all labs ordered are listed, but only abnormal results are displayed) Labs Reviewed  CBC - Abnormal; Notable for the following components:      Result Value   WBC 24.6 (*)    RDW 17.3 (*)    All other components within normal limits  BASIC METABOLIC PANEL - Abnormal; Notable for the following components:   Glucose,  Bld 125 (*)    Calcium 8.6 (*)    All other components within normal limits  RESPIRATORY PANEL BY PCR  CULTURE, BLOOD (ROUTINE X 2)  CULTURE, BLOOD (ROUTINE X 2)  CULTURE, EXPECTORATED SPUTUM-ASSESSMENT  GRAM STAIN  BRAIN NATRIURETIC PEPTIDE  INFLUENZA PANEL BY PCR (TYPE A & B)  LACTIC ACID, PLASMA  LACTIC ACID, PLASMA  PROCALCITONIN  HIV ANTIBODY (ROUTINE TESTING)  STREP PNEUMONIAE URINARY ANTIGEN  HCG, QUANTITATIVE, PREGNANCY    EKG Interpretation  Date/Time:  Monday May 26 2017 00:38:07 EST Ventricular Rate:  96 PR Interval:  122 QRS Duration: 80 QT Interval:  358 QTC Calculation: 452 R Axis:   2 Text Interpretation:  Normal sinus rhythm Right atrial enlargement Possible Anterior infarct , age undetermined Confirmed by Dory Horn) on 05/26/2017 4:15:02 AM        Radiology Dg Chest 2 View  Result Date: 05/26/2017 CLINICAL DATA:  Reports asthma flair for a couple of days worse today. No relief using inhaler at home. Also reports non productive cough. EXAM: CHEST  2 VIEW COMPARISON:  Chest x-ray dated 04/28/2017. FINDINGS: Heart size and mediastinal contours are within normal limits. Lungs are clear. No pleural effusion or pneumothorax seen. Osseous structures about the chest are unremarkable. IMPRESSION: No active cardiopulmonary  disease. No evidence of pneumonia or pulmonary edema. Electronically Signed   By: Franki Cabot M.D.   On: 05/26/2017 01:14    Procedures .Critical Care Performed by: Abigail Butts, PA-C Authorized by: Abigail Butts, PA-C   Critical care provider statement:    Critical care time (minutes):  40   Critical care time was exclusive of:  Separately billable procedures and treating other patients and teaching time   Critical care was necessary to treat or prevent imminent or life-threatening deterioration of the following conditions:  Respiratory failure   Critical care was time spent personally by me on the following activities:  Development of treatment plan with patient or surrogate, discussions with consultants, evaluation of patient's response to treatment, examination of patient, obtaining history from patient or surrogate, ordering and performing treatments and interventions, ordering and review of laboratory studies, ordering and review of radiographic studies, pulse oximetry, review of old charts and re-evaluation of patient's condition   I assumed direction of critical care for this patient from another provider in my specialty: no     (including critical care time)  Medications Ordered in ED Medications  ipratropium (ATROVENT) nebulizer solution 1 mg (not administered)  methylPREDNISolone sodium succinate (SOLU-MEDROL) 125 mg/2 mL injection 60 mg (not administered)  amLODipine (NORVASC) tablet 10 mg (not administered)  budesonide (PULMICORT) nebulizer solution 0.25 mg (not administered)  busPIRone (BUSPAR) tablet 7.5 mg (not administered)  butalbital-acetaminophen-caffeine (FIORICET, ESGIC) 50-325-40 MG per tablet 1-2 tablet (not administered)  famotidine (PEPCID) tablet 20 mg (not administered)  fluticasone (FLONASE) 50 MCG/ACT nasal spray 2 spray (not administered)  loratadine (CLARITIN) tablet 10 mg (not administered)  losartan (COZAAR) tablet 100 mg (not  administered)  metoprolol tartrate (LOPRESSOR) tablet 100 mg (not administered)  montelukast (SINGULAIR) tablet 10 mg (not administered)  nicotine (NICODERM CQ - dosed in mg/24 hours) patch 14 mg (not administered)  pantoprazole (PROTONIX) EC tablet 40 mg (not administered)  sertraline (ZOLOFT) tablet 100 mg (not administered)  zolpidem (AMBIEN) tablet 10 mg (not administered)  levalbuterol (XOPENEX) nebulizer solution 1.25 mg (1.25 mg Nebulization Not Given 05/26/17 0621)  dextromethorphan-guaiFENesin (MUCINEX DM) 30-600 MG per 12 hr tablet 1 tablet (not administered)  azithromycin (ZITHROMAX) tablet  500 mg (not administered)    Followed by  azithromycin (ZITHROMAX) tablet 250 mg (not administered)  enoxaparin (LOVENOX) injection 75 mg (not administered)  hydrALAZINE (APRESOLINE) injection 5 mg (not administered)  ondansetron (ZOFRAN) injection 4 mg (not administered)  acetaminophen (TYLENOL) tablet 650 mg (not administered)  zolpidem (AMBIEN) tablet 5 mg (not administered)  sodium chloride 0.9 % bolus 1,000 mL (not administered)  ipratropium (ATROVENT) nebulizer solution 1 mg (1 mg Nebulization Given 05/26/17 0222)  methylPREDNISolone sodium succinate (SOLU-MEDROL) 125 mg/2 mL injection 125 mg (125 mg Intravenous Given 05/26/17 0233)  magnesium sulfate IVPB 2 g 50 mL (0 g Intravenous Stopped 05/26/17 0427)  albuterol (PROVENTIL) (2.5 MG/3ML) 0.083% nebulizer solution 5 mg (5 mg Nebulization Given 05/26/17 0442)  ipratropium (ATROVENT) nebulizer solution 0.5 mg (0.5 mg Nebulization Given 05/26/17 0443)  hydrALAZINE (APRESOLINE) injection 10 mg (10 mg Intravenous Given 05/26/17 0557)     Initial Impression / Assessment and Plan / ED Course  I have reviewed the triage vital signs and the nursing notes.  Pertinent labs & imaging results that were available during my care of the patient were reviewed by me and considered in my medical decision making (see chart for details).  Clinical Course as  of May 26 642  Mon May 26, 2017  0300 Respiratory distress after hour-long nebulizer.  She will need BiPAP.  [HM]  4196 Pt with BiPap and increased tidal volume.  She continues to wheeze.   [HM]  [phone] Discussed with Dr Blaine Hamper who will admit  [HM]    Clinical Course User Index [HM] Hisae Decoursey, Jarrett Soho, PA-C    Pt presents with asthma exacerbation.  She has hx of same. Pt with numerous nebs at home without relief.  Several nebs here including hour long nebulizer along with magnesium and solumedrol without improvement.  Pt without hypoxia, but continued distress.  Pt placed on BiPAP with some improvement in work of breathing, but pt continues to have significant wheezing.  Patient is hypertensive.  Given her home meds and hydralazine.  She has no chest pain.  She does have persistent tachycardia as well, likely secondary to respiratory distress and large doses of albuterol.  Labs with significant leukocytosis likely secondary to her chronic steroid use.  She will need admission.   Final Clinical Impressions(s) / ED Diagnoses   Final diagnoses:  Severe persistent asthma with exacerbation  Tachycardia    ED Discharge Orders    None       Agapito Games 05/26/17 [place], April, MD 05/26/17 [phone]

## 2017-05-26 NOTE — Progress Notes (Signed)
Pt refused bipap

## 2017-05-26 NOTE — ED Notes (Signed)
Appetite good , sitting on the side of the bed.

## 2017-05-26 NOTE — ED Triage Notes (Signed)
Reports asthma flair for a couple of days worse today.  No relief using inhaler at home.  Also reports non productive cough.

## 2017-05-26 NOTE — ED Notes (Signed)
Pt is unable to speak in full sentences d/t shob however is irate that she has not been given a [place] sandwich and soda.  Explained to pt that this writer did not feel comfortable given anything PO d/t concern for aspiration.  Pt remains irate stating, "Send the doctor in so I can get me a [place] sandwich."

## 2017-05-26 NOTE — Progress Notes (Signed)
April [name] TEAM 1 - Stepdown/ICU TEAM  April [name]  [id number]:[id number] DOB: Nov 14, 1970 DOA: 05/26/2017 PCP: [name], MD    Brief Narrative:  47 y.o. female with a history of COPD / asthma, HTN, GERD, depression, tobacco abuse, OSA on CPAP, and grade 1 diastolic CHF who presented with progressively worsening cough and shortness of breath for 4 days. In the ED she could not speak in complete sentences due to respiratory distress.  Her CXR was unrevealing.    Subjective: Pt is seen for a f/u visit.    Assessment & Plan:  SIRS v/s Sepsis w/ Acute on chronic hypoxic respiratory failure due to COPD/asthma exacerbation Slowly stabilizing - now off BIPAP   Hypertensive urgency Adjust BP meds again and follow   GERD  Depression Stable  OSA on CPAP at home - cont QHS  Chronic diastolic CHF w/o acute exacerbation TTE 10/03/16 noted EF of 55-60% with grade 1 diastolic dysfunction  Tobacco abuse Nicotine patch - counseled on abstinence  Morbid Obesity - Body mass index is 52.62 kg/m.   DVT prophylaxis: lovenox  Code Status: FULL CODE Family Communication: no family present at time of exam  Disposition Plan: stable for tele bed   Consultants:  none  Procedures: none  Antimicrobials:  Azithro 1/28 >  Objective: Blood pressure (!) 181/87, pulse (!) 104, temperature 98.5 F (36.9 C), temperature source Oral, resp. rate (!) 21, height 5\' 6"  (1.676 m), weight (!) 147.9 kg (326 lb), SpO2 99 %.  Intake/Output Summary (Last 24 hours) at 05/26/2017 1125 Last data filed at 05/26/2017 0427 Gross per 24 hour  Intake 50 ml  Output -  Net 50 ml   Filed Weights   05/26/17 0033  Weight: (!) 147.9 kg (326 lb)    Examination: Pt was seen for a f/u visit.    CBC: Recent Labs  Lab 05/26/17 0316  WBC 24.6*  HGB 12.9  HCT 40.7  MCV 87.0  PLT 169   Basic Metabolic Panel: Recent Labs  Lab 05/26/17 0316  NA 137  K 3.8  CL 101  CO2 22  GLUCOSE 125*  BUN 15    CREATININE 0.90  CALCIUM 8.6*   GFR: Estimated Creatinine Clearance: 119.3 mL/min (by C-G formula based on SCr of 0.9 mg/dL).  Liver Function Tests: No results for input(s): AST, ALT, ALKPHOS, BILITOT, PROT, ALBUMIN in the last 168 hours. No results for input(s): LIPASE, AMYLASE in the last 168 hours. No results for input(s): AMMONIA in the last 168 hours.  HbA1C: Hgb A1c MFr Bld  Date/Time Value Ref Range Status  04/28/2017 04:29 AM 5.6 4.8 - 5.6 % Final    Comment:    (NOTE) Pre diabetes:          5.7%-6.4% Diabetes:              >6.4% Glycemic control for   <7.0% adults with diabetes   02/04/2017 08:39 AM 6.1 (H) 4.8 - 5.6 % Final    Comment:    (NOTE) Pre diabetes:          5.7%-6.4% Diabetes:              >6.4% Glycemic control for   <7.0% adults with diabetes      Scheduled Meds: . albuterol  2.5 mg Nebulization Q6H  . amLODipine  10 mg Oral Daily  . [START ON 05/27/2017] azithromycin  250 mg Oral Daily  . budesonide  0.25 mg Nebulization BID  . busPIRone  7.5  mg Oral BID  . enoxaparin (LOVENOX) injection  75 mg Subcutaneous Q24H  . famotidine  20 mg Oral BID  . fluticasone  2 spray Each Nare Daily  . ipratropium  0.5 mg Nebulization Q6H  . loratadine  10 mg Oral Daily  . losartan  100 mg Oral Daily  . methylPREDNISolone (SOLU-MEDROL) injection  60 mg Intravenous Q8H  . metoprolol tartrate  100 mg Oral BID  . montelukast  10 mg Oral QHS  . nicotine  14 mg Transdermal Daily  . pantoprazole  40 mg Oral Daily  . sertraline  100 mg Oral Daily  . zolpidem  10 mg Oral QHS     LOS: 0 days   Time spent: No Charge  Cherene Altes, MD [facility] Office  [phone] Pager - Text Page per Amion as per below:  On-Call/Text Page:      [email or web address]      password [email or web address]  If 7PM-7AM, please contact night-coverage [email or web address] Password [email or web address] 05/26/2017, 11:25 AM

## 2017-05-27 LAB — HIV ANTIBODY (ROUTINE TESTING W REFLEX): HIV SCREEN 4TH GENERATION: NONREACTIVE

## 2017-05-27 MED ORDER — METHYLPREDNISOLONE SODIUM SUCC 125 MG IJ SOLR
60.0000 mg | Freq: Three times a day (TID) | INTRAMUSCULAR | Status: DC
  Administered 2017-05-27 – 2017-06-01 (×15): 60 mg via INTRAVENOUS
  Filled 2017-05-27 (×15): qty 2

## 2017-05-27 MED ORDER — FUROSEMIDE 10 MG/ML IJ SOLN
40.0000 mg | Freq: Two times a day (BID) | INTRAMUSCULAR | Status: DC
  Administered 2017-05-27 – 2017-05-28 (×4): 40 mg via INTRAVENOUS
  Filled 2017-05-27 (×4): qty 4

## 2017-05-27 NOTE — Progress Notes (Signed)
Pt slept on and off overnight, wheezing and on breathing treatment. Vitals stable, on oxygen @2l /min, ambulatory inside the room, denies CP, and distress, will continue to  Monitor Palma Holter, RN

## 2017-05-27 NOTE — Progress Notes (Signed)
   05/27/17 0900  Clinical Encounter Type  Visited With Patient  Visit Type Initial  Referral From Nurse  Consult/Referral To Chaplain  Spiritual Encounters  Spiritual Needs Emotional;Prayer  Stress Factors  Patient Stress Factors Health changes;Exhausted (Lots of stress)   Responded to a SCC for this patient who had requested prayer.  Patient was alone and room was dark and shades drawn.  Patient shared some of why she is in the hospital and went on to talk about a lot of stress in her life.  Seems to have a pretty good family support network.  She said she wants to do better in terms of her life and managing stress.  We prayed together.  Will follow as needed. Chaplain Katherene Ponto

## 2017-05-27 NOTE — Progress Notes (Signed)
[facility] TEAM 1 - Stepdown/ICU TEAM  April [name]  YIR:[id number] DOB: [birth year] DOA: 05/26/2017 PCP: [name], MD    Brief Narrative:  47 y.o. female with a history of COPD / asthma, HTN, GERD, depression, tobacco abuse, OSA on CPAP, and grade 1 diastolic CHF who presented with progressively worsening cough and shortness of breath for 4 days. In the ED she could not speak in complete sentences due to respiratory distress.  Her CXR was unrevealing.    Subjective: The patient is resting comfortably in bed but continues to wheeze.  She denies chest pain nausea vomiting or abdominal pain.  She feels her shortness of breath has improved but is not yet back to baseline.  She is audibly wheezing as is appreciable upon entering the room.  Assessment & Plan:  SIRS w/ Acute on chronic hypoxic respiratory failure due to COPD/asthma exacerbation Slowly improving though continues to wheeze - off BIPAP but still requiring 4L [place] support (not on O2 at home) - no true sepsis as there is no convincing evidence of an infection   Hypertensive urgency BP much improved, though not yet at long term goal - follow w/ ongoing improvement in resp status   GERD Cont pepcid  Depression Stable  OSA on CPAP at home - cont QHS CPAP or BIPAP while in hospital   Chronic diastolic CHF w/o acute exacerbation TTE 10/03/16 noted EF of 55-60% with grade 1 diastolic dysfunction - 50KX appears to be her approx dry weight as of late - diurese and follow as exam suggests modest overload   Filed Weights   05/26/17 0033 05/26/17 1700 05/27/17 0539  Weight: (!) 147.9 kg (326 lb) (!) 153.1 kg (337 lb 8.4 oz) (!) 154.3 kg (340 lb 3.2 oz)    Tobacco abuse Nicotine patch - counseled on abstinence  Morbid Obesity - Body mass index is 54.91 kg/m.   DVT prophylaxis: lovenox  Code Status: FULL CODE Family Communication: no family present at time of exam  Disposition Plan: tele bed - diurese - cont to work on  wheezing    Consultants:  none  Procedures: none  Antimicrobials:  Azithro 1/28 >  Objective: Blood pressure (!) 151/90, pulse 72, temperature 98.6 F (37 C), temperature source Oral, resp. rate 18, height 5\' 6"  (1.676 m), weight (!) 154.3 kg (340 lb 3.2 oz), SpO2 98 %.  Intake/Output Summary (Last 24 hours) at 05/27/2017 1006 Last data filed at 05/27/2017 0600 Gross per 24 hour  Intake 440 ml  Output 1200 ml  Net -760 ml   Filed Weights   05/26/17 0033 05/26/17 1700 05/27/17 0539  Weight: (!) 147.9 kg (326 lb) (!) 153.1 kg (337 lb 8.4 oz) (!) 154.3 kg (340 lb 3.2 oz)    Examination: General: continued wheezing - no acute distress  Lungs: diffuse exp wheezing - no focal crackles  Cardiovascular: Regular rate and rhythm without murmur gallop or rub normal S1 and S2 Abdomen: Nontender, obese, soft, bowel sounds positive, no rebound, no ascites, no appreciable mass Extremities: 1+ B LE edema w/o cyanosis     CBC: Recent Labs  Lab 05/26/17 0316  WBC 24.6*  HGB 12.9  HCT 40.7  MCV 87.0  PLT 381   Basic Metabolic Panel: Recent Labs  Lab 05/26/17 0316  NA 137  K 3.8  CL 101  CO2 22  GLUCOSE 125*  BUN 15  CREATININE 0.90  CALCIUM 8.6*   GFR: Estimated Creatinine Clearance: 122.5 mL/min (by C-G formula based  on SCr of 0.9 mg/dL).  Liver Function Tests: No results for input(s): AST, ALT, ALKPHOS, BILITOT, PROT, ALBUMIN in the last 168 hours. No results for input(s): LIPASE, AMYLASE in the last 168 hours. No results for input(s): AMMONIA in the last 168 hours.  HbA1C: Hgb A1c MFr Bld  Date/Time Value Ref Range Status  04/28/2017 04:29 AM 5.6 4.8 - 5.6 % Final    Comment:    (NOTE) Pre diabetes:          5.7%-6.4% Diabetes:              >6.4% Glycemic control for   <7.0% adults with diabetes   02/04/2017 08:39 AM 6.1 (H) 4.8 - 5.6 % Final    Comment:    (NOTE) Pre diabetes:          5.7%-6.4% Diabetes:              >6.4% Glycemic control for    <7.0% adults with diabetes      Scheduled Meds: . amLODipine  10 mg Oral Daily  . azithromycin  250 mg Oral Daily  . budesonide  0.25 mg Nebulization BID  . busPIRone  7.5 mg Oral BID  . enoxaparin (LOVENOX) injection  75 mg Subcutaneous Q24H  . famotidine  20 mg Oral BID  . fluticasone  2 spray Each Nare Daily  . furosemide  20 mg Oral Daily  . ipratropium-albuterol  3 mL Nebulization Q6H  . loratadine  10 mg Oral Daily  . losartan  100 mg Oral Daily  . methylPREDNISolone (SOLU-MEDROL) injection  60 mg Intravenous Q12H  . metoprolol tartrate  100 mg Oral BID  . montelukast  10 mg Oral QHS  . nicotine  14 mg Transdermal Daily  . pantoprazole  40 mg Oral Daily  . sertraline  100 mg Oral Daily  . zolpidem  10 mg Oral QHS     LOS: 1 day    Cherene Altes, MD [facility] Office  [phone] Pager - Text Page per Amion as per below:  On-Call/Text Page:      [email or web address]      password [email or web address]  If 7PM-7AM, please contact night-coverage [email or web address] Password [email or web address] 05/27/2017, 10:06 AM

## 2017-05-27 NOTE — Progress Notes (Signed)
Pt's respiratory panel is negative, DC isolation  April [name], Therapist, sports

## 2017-05-28 LAB — BASIC METABOLIC PANEL
Anion gap: 11 (ref 5–15)
BUN: 20 mg/dL (ref 6–20)
CALCIUM: 8.4 mg/dL — AB (ref 8.9–10.3)
CO2: 26 mmol/L (ref 22–32)
CREATININE: 0.88 mg/dL (ref 0.44–1.00)
Chloride: 99 mmol/L — ABNORMAL LOW (ref 101–111)
Glucose, Bld: 178 mg/dL — ABNORMAL HIGH (ref 65–99)
Potassium: 3.7 mmol/L (ref 3.5–5.1)
Sodium: 136 mmol/L (ref 135–145)

## 2017-05-28 LAB — MAGNESIUM: Magnesium: 2.1 mg/dL (ref 1.7–2.4)

## 2017-05-28 MED ORDER — FUROSEMIDE 10 MG/ML IJ SOLN
40.0000 mg | Freq: Two times a day (BID) | INTRAMUSCULAR | Status: DC
  Administered 2017-05-29: 40 mg via INTRAVENOUS
  Filled 2017-05-28: qty 4

## 2017-05-28 MED ORDER — SODIUM CHLORIDE 0.9 % IV BOLUS (SEPSIS)
500.0000 mL | Freq: Once | INTRAVENOUS | Status: AC
  Administered 2017-05-28: 500 mL via INTRAVENOUS

## 2017-05-28 MED ORDER — ZOLPIDEM TARTRATE 5 MG PO TABS
10.0000 mg | ORAL_TABLET | Freq: Every evening | ORAL | Status: DC | PRN
  Administered 2017-05-29 – 2017-05-31 (×3): 10 mg via ORAL
  Filled 2017-05-28 (×3): qty 2

## 2017-05-28 MED ORDER — ZOLPIDEM TARTRATE 5 MG PO TABS
5.0000 mg | ORAL_TABLET | Freq: Once | ORAL | Status: AC
  Administered 2017-05-28: 5 mg via ORAL
  Filled 2017-05-28: qty 1

## 2017-05-28 MED ORDER — METOPROLOL TARTRATE 5 MG/5ML IV SOLN
5.0000 mg | Freq: Once | INTRAVENOUS | Status: AC
  Administered 2017-05-28: 5 mg via INTRAVENOUS
  Filled 2017-05-28: qty 5

## 2017-05-28 NOTE — Progress Notes (Signed)
Pt refuse CPAP of the night

## 2017-05-28 NOTE — Progress Notes (Signed)
April [name]  YTK:[id number] DOB: 06/09/1970 DOA: 05/26/2017 PCP: [name], MD    Brief Narrative:  47 y.o. female with a history of COPD / asthma, HTN, GERD, depression, tobacco abuse, OSA on CPAP, and grade 1 diastolic CHF who presented with progressively worsening cough and shortness of breath for 4 days. In the ED she could not speak in complete sentences due to respiratory distress.  Her CXR was unrevealing.    Subjective: She report some improvement of SOB, but not at baseline. She is still having audible wheezing.   Assessment & Plan:  SIRS w/ Acute on chronic hypoxic respiratory failure due to COPD/asthma exacerbation Required BIPAP initially. She was transition to 4 l oxygen. Today on RA.  Will continue with schedule nebulizer and IV lasix. Still with significant wheezing.  We discussed about weight lost, GERD prevention and avoidance of smoking.  Continue with azithromycin.  Continue with Pulmicort,  Montelukast. She is on chronic prednisone, 20 mg daily.   Hypertensive urgency On Norvasc, cozaar.   GERD Cont pepcid  Depression Stable  OSA on CPAP at home - cont QHS CPAP or BIPAP while in hospital   Chronic diastolic CHF w/o acute exacerbation TTE 10/03/16 noted EF of 55-60% with grade 1 diastolic dysfunction -  Continue with IV lasix.   Filed Weights   05/26/17 1700 05/27/17 0539 05/28/17 0541  Weight: (!) 153.1 kg (337 lb 8.4 oz) (!) 154.3 kg (340 lb 3.2 oz) (!) 152 kg (335 lb 3.2 oz)    Tobacco abuse Nicotine patch - counseled on abstinence  Morbid Obesity - Body mass index is 54.1 kg/m.  Leukocytosis; suspect related to steroids.    DVT prophylaxis: lovenox  Code Status: FULL CODE Family Communication: no family present at time of exam  Disposition Plan: home when respiratory status improved.   Consultants:  none  Procedures: none  Antimicrobials:  Azithro 1/28 >  Objective: Blood pressure (!) 142/73, pulse 67, temperature 97.8  F (36.6 C), temperature source Oral, resp. rate 18, height 5\' 6"  (1.676 m), weight (!) 152 kg (335 lb 3.2 oz), SpO2 99 %. No intake or output data in the 24 hours ending 05/28/17 1333 Filed Weights   05/26/17 1700 05/27/17 0539 05/28/17 0541  Weight: (!) 153.1 kg (337 lb 8.4 oz) (!) 154.3 kg (340 lb 3.2 oz) (!) 152 kg (335 lb 3.2 oz)    Examination: General: no acute distress.  Lungs: bilateral wheezing, diffuse.  Cardiovascular: S 1, S 2 RRR Abdomen: BS present, soft, nt, nd Extremities: trace edema.     CBC: Recent Labs  Lab 05/26/17 0316  WBC 24.6*  HGB 12.9  HCT 40.7  MCV 87.0  PLT 751   Basic Metabolic Panel: Recent Labs  Lab 05/26/17 0316 05/28/17 0453  NA 137 136  K 3.8 3.7  CL 101 99*  CO2 22 26  GLUCOSE 125* 178*  BUN 15 20  CREATININE 0.90 0.88  CALCIUM 8.6* 8.4*  MG  --  2.1   GFR: Estimated Creatinine Clearance: 124.2 mL/min (by C-G formula based on SCr of 0.88 mg/dL).  Liver Function Tests: No results for input(s): AST, ALT, ALKPHOS, BILITOT, PROT, ALBUMIN in the last 168 hours. No results for input(s): LIPASE, AMYLASE in the last 168 hours. No results for input(s): AMMONIA in the last 168 hours.  HbA1C: Hgb A1c MFr Bld  Date/Time Value Ref Range Status  04/28/2017 04:29 AM 5.6 4.8 - 5.6 % Final    Comment:    (  NOTE) Pre diabetes:          5.7%-6.4% Diabetes:              >6.4% Glycemic control for   <7.0% adults with diabetes   02/04/2017 08:39 AM 6.1 (H) 4.8 - 5.6 % Final    Comment:    (NOTE) Pre diabetes:          5.7%-6.4% Diabetes:              >6.4% Glycemic control for   <7.0% adults with diabetes      Scheduled Meds: . amLODipine  10 mg Oral Daily  . azithromycin  250 mg Oral Daily  . budesonide  0.25 mg Nebulization BID  . busPIRone  7.5 mg Oral BID  . enoxaparin (LOVENOX) injection  75 mg Subcutaneous Q24H  . famotidine  20 mg Oral BID  . fluticasone  2 spray Each Nare Daily  . furosemide  40 mg Intravenous Q12H    . ipratropium-albuterol  3 mL Nebulization Q6H  . loratadine  10 mg Oral Daily  . losartan  100 mg Oral Daily  . methylPREDNISolone (SOLU-MEDROL) injection  60 mg Intravenous Q8H  . metoprolol tartrate  100 mg Oral BID  . montelukast  10 mg Oral QHS  . nicotine  14 mg Transdermal Daily  . pantoprazole  40 mg Oral Daily  . sertraline  100 mg Oral Daily     LOS: 2 days    Niel Hummer, MD.  [phone] [facility]   On-Call/Text Page:      [email or web address]      password [email or web address]  If 7PM-7AM, please contact night-coverage [email or web address] Password [email or web address] 05/28/2017, 1:33 PM

## 2017-05-29 DIAGNOSIS — J9621 Acute and chronic respiratory failure with hypoxia: Secondary | ICD-10-CM

## 2017-05-29 DIAGNOSIS — J441 Chronic obstructive pulmonary disease with (acute) exacerbation: Principal | ICD-10-CM

## 2017-05-29 DIAGNOSIS — J45901 Unspecified asthma with (acute) exacerbation: Secondary | ICD-10-CM

## 2017-05-29 DIAGNOSIS — I4891 Unspecified atrial fibrillation: Secondary | ICD-10-CM

## 2017-05-29 DIAGNOSIS — Z6841 Body Mass Index (BMI) 40.0 and over, adult: Secondary | ICD-10-CM

## 2017-05-29 DIAGNOSIS — G4733 Obstructive sleep apnea (adult) (pediatric): Secondary | ICD-10-CM

## 2017-05-29 LAB — BASIC METABOLIC PANEL
ANION GAP: 11 (ref 5–15)
BUN: 26 mg/dL — AB (ref 6–20)
CO2: 25 mmol/L (ref 22–32)
Calcium: 8.3 mg/dL — ABNORMAL LOW (ref 8.9–10.3)
Chloride: 101 mmol/L (ref 101–111)
Creatinine, Ser: 1.03 mg/dL — ABNORMAL HIGH (ref 0.44–1.00)
GFR calc Af Amer: >60 mL/min (ref >60)
GLUCOSE: 237 mg/dL — AB (ref 65–99)
Potassium: 3.9 mmol/L (ref 3.5–5.1)
Sodium: 137 mmol/L (ref 135–145)

## 2017-05-29 LAB — CBC
HEMATOCRIT: 41 % (ref 36.0–46.0)
Hemoglobin: 12.8 g/dL (ref 12.0–15.0)
MCH: 27.4 pg (ref 26.0–34.0)
MCHC: 31.2 g/dL (ref 30.0–36.0)
MCV: 87.6 fL (ref 78.0–100.0)
PLATELETS: 374 10*3/uL (ref 150–400)
RBC: 4.68 MIL/uL (ref 3.87–5.11)
RDW: 17 % — AB (ref 11.5–15.5)
WBC: 25.8 10*3/uL — ABNORMAL HIGH (ref 4.0–10.5)

## 2017-05-29 MED ORDER — FUROSEMIDE 10 MG/ML IJ SOLN
40.0000 mg | Freq: Two times a day (BID) | INTRAMUSCULAR | Status: DC
  Administered 2017-05-30 – 2017-05-31 (×3): 40 mg via INTRAVENOUS
  Filled 2017-05-29 (×3): qty 4

## 2017-05-29 MED ORDER — DILTIAZEM HCL-DEXTROSE 100-5 MG/100ML-% IV SOLN (PREMIX)
10.0000 mg/h | INTRAVENOUS | Status: DC
  Administered 2017-05-29 – 2017-05-30 (×2): 10 mg/h via INTRAVENOUS
  Filled 2017-05-29 (×3): qty 100

## 2017-05-29 MED ORDER — APIXABAN 5 MG PO TABS: 5.0000 mg | ORAL_TABLET | Freq: Two times a day (BID) | ORAL | Status: DC

## 2017-05-29 MED ORDER — DILTIAZEM HCL 25 MG/5ML IV SOLN
10.0000 mg | Freq: Once | INTRAVENOUS | Status: AC
  Administered 2017-05-29: 10 mg via INTRAVENOUS
  Filled 2017-05-29: qty 5

## 2017-05-29 MED ORDER — LEVALBUTEROL HCL 0.63 MG/3ML IN NEBU
0.6300 mg | INHALATION_SOLUTION | Freq: Four times a day (QID) | RESPIRATORY_TRACT | Status: DC
  Administered 2017-05-29 – 2017-06-01 (×11): 0.63 mg via RESPIRATORY_TRACT
  Filled 2017-05-29 (×12): qty 3

## 2017-05-29 MED ORDER — GUAIFENESIN ER 600 MG PO TB12
600.0000 mg | ORAL_TABLET | Freq: Two times a day (BID) | ORAL | Status: DC
  Administered 2017-05-29 – 2017-06-01 (×7): 600 mg via ORAL
  Filled 2017-05-29 (×9): qty 1

## 2017-05-29 MED ORDER — APIXABAN 5 MG PO TABS
5.0000 mg | ORAL_TABLET | Freq: Two times a day (BID) | ORAL | Status: DC
  Administered 2017-05-29 – 2017-06-01 (×7): 5 mg via ORAL
  Filled 2017-05-29 (×7): qty 1

## 2017-05-29 MED ORDER — METOPROLOL TARTRATE 5 MG/5ML IV SOLN
5.0000 mg | Freq: Once | INTRAVENOUS | Status: AC
  Administered 2017-05-29: 5 mg via INTRAVENOUS
  Filled 2017-05-29: qty 5

## 2017-05-29 MED ORDER — LEVALBUTEROL HCL 0.63 MG/3ML IN NEBU
0.6300 mg | INHALATION_SOLUTION | RESPIRATORY_TRACT | Status: DC | PRN
  Administered 2017-05-29 – 2017-06-01 (×8): 0.63 mg via RESPIRATORY_TRACT
  Filled 2017-05-29 (×9): qty 3

## 2017-05-29 NOTE — Progress Notes (Signed)
Pt's HR down to the 120s-130s  in A fib after 1 dose of IV Metoprolol and NS bolus. BP 142/81. MD paged. Orders received to give Metoprolol 5mg  IV, D/C Duoneb, and start Xopenex. HR still increases to the 150s temporarily when pt ambulates to the restroom. Pt declines bedside commode at this time. O2 placed back on pt and pt instructed not to remove. Will continue to monitor.

## 2017-05-29 NOTE — Progress Notes (Signed)
Pt refuse CPAP for the night. Pt is taking neb at this time.

## 2017-05-29 NOTE — Progress Notes (Signed)
DUONEB treatment terminated patient is in A-fib RVR with a rate of 120-130's. Pt is stable at this time. Irregular palpitated radial pulse.

## 2017-05-29 NOTE — Progress Notes (Addendum)
April [name]  PVV:[id number] DOB: [birth year] DOA: 05/26/2017 PCP: [name], MD    Brief Narrative:  47 y.o. female with a history of COPD / asthma, HTN, GERD, depression, tobacco abuse, OSA on CPAP, and grade 1 diastolic CHF who presented with progressively worsening cough and shortness of breath for 4 days. In the ED she could not speak in complete sentences due to respiratory distress.  Her CXR was unrevealing.    Subjective: Develops A fib RVR overnight. Denies chest pain. Report worsening dyspnea today   Assessment & Plan:  SIRS w/ Acute on chronic hypoxic respiratory failure due to COPD/asthma exacerbation Required BIPAP initially. She was transition to 4 l oxygen. Today on RA.  Will continue with schedule nebulizer and IV lasix. Continue with IV solumedrol.  We discussed about weight lost, GERD prevention and avoidance of smoking.  Continue with azithromycin.  Continue with Pulmicort,  Montelukast. She is on chronic prednisone, 20 mg daily.   Hypertensive urgency On Norvasc, cozaar.   A fib RVR; new diagnosis/  On Cardizem gtt.  On metoprolol/  CHADS score 2. Cardiology consulted for recommendation for anticoagulation.   GERD Cont pepcid  Depression Stable  OSA on CPAP at home - cont QHS CPAP or BIPAP while in hospital   Chronic diastolic CHF w/o acute exacerbation TTE 10/03/16 noted EF of 55-60% with grade 1 diastolic dysfunction -  Continue with IV lasix.   Filed Weights   05/26/17 1700 05/27/17 0539 05/28/17 0541  Weight: (!) 153.1 kg (337 lb 8.4 oz) (!) 154.3 kg (340 lb 3.2 oz) (!) 152 kg (335 lb 3.2 oz)  negative 3 L.  Mild increase cr, hold cozaar. Monitor on lasix.  Hold to night dose of lasix.   Tobacco abuse Nicotine patch - counseled on abstinence  Morbid Obesity - Body mass index is 54.1 kg/m.  Leukocytosis; suspect related to steroids.    DVT prophylaxis: lovenox  Code Status: FULL CODE Family Communication: no family present at  time of exam  Disposition Plan: home when respiratory status improved.   Consultants:  none  Procedures: none  Antimicrobials:  Azithro 1/28 >  Objective: Blood pressure (!) 141/101, pulse (!) 139, temperature 98.2 F (36.8 C), temperature source Oral, resp. rate 18, height 5\' 6"  (1.676 m), weight (!) 152 kg (335 lb 3.2 oz), SpO2 100 %.  Intake/Output Summary (Last 24 hours) at 05/29/2017 1346 Last data filed at 05/29/2017 0953 Gross per 24 hour  Intake 1080 ml  Output 1800 ml  Net -720 ml   Filed Weights   05/26/17 1700 05/27/17 0539 05/28/17 0541  Weight: (!) 153.1 kg (337 lb 8.4 oz) (!) 154.3 kg (340 lb 3.2 oz) (!) 152 kg (335 lb 3.2 oz)    Examination: General: no acute distress.  Lungs: mild tachypnea, bilateral wheezing,.  Cardiovascular: S 1, S 2 IRR  Abdomen: BS present, soft, nt Extremities: trace edema    CBC: Recent Labs  Lab 05/26/17 0316 05/29/17 0601  WBC 24.6* 25.8*  HGB 12.9 12.8  HCT 40.7 41.0  MCV 87.0 87.6  PLT 375 754   Basic Metabolic Panel: Recent Labs  Lab 05/26/17 0316 05/28/17 0453 05/29/17 0840  NA 137 136 137  K 3.8 3.7 3.9  CL 101 99* 101  CO2 22 26 25   GLUCOSE 125* 178* 237*  BUN 15 20 26*  CREATININE 0.90 0.88 1.03*  CALCIUM 8.6* 8.4* 8.3*  MG  --  2.1  --    GFR:  Estimated Creatinine Clearance: 106.1 mL/min (A) (by C-G formula based on SCr of 1.03 mg/dL (H)).  Liver Function Tests: No results for input(s): AST, ALT, ALKPHOS, BILITOT, PROT, ALBUMIN in the last 168 hours. No results for input(s): LIPASE, AMYLASE in the last 168 hours. No results for input(s): AMMONIA in the last 168 hours.  HbA1C: Hgb A1c MFr Bld  Date/Time Value Ref Range Status  04/28/2017 04:29 AM 5.6 4.8 - 5.6 % Final    Comment:    (NOTE) Pre diabetes:          5.7%-6.4% Diabetes:              >6.4% Glycemic control for   <7.0% adults with diabetes   02/04/2017 08:39 AM 6.1 (H) 4.8 - 5.6 % Final    Comment:    (NOTE) Pre diabetes:           5.7%-6.4% Diabetes:              >6.4% Glycemic control for   <7.0% adults with diabetes      Scheduled Meds: . amLODipine  10 mg Oral Daily  . apixaban  5 mg Oral BID  . azithromycin  250 mg Oral Daily  . budesonide  0.25 mg Nebulization BID  . busPIRone  7.5 mg Oral BID  . famotidine  20 mg Oral BID  . fluticasone  2 spray Each Nare Daily  . furosemide  40 mg Intravenous BID  . guaiFENesin  600 mg Oral BID  . levalbuterol  0.63 mg Nebulization Q6H  . loratadine  10 mg Oral Daily  . losartan  100 mg Oral Daily  . methylPREDNISolone (SOLU-MEDROL) injection  60 mg Intravenous Q8H  . metoprolol tartrate  100 mg Oral BID  . montelukast  10 mg Oral QHS  . nicotine  14 mg Transdermal Daily  . pantoprazole  40 mg Oral Daily  . sertraline  100 mg Oral Daily     LOS: 3 days    Niel Hummer, MD.  [phone] [facility]   On-Call/Text Page:      [email or web address]      password [email or web address]  If 7PM-7AM, please contact night-coverage [email or web address] Password [email or web address] 05/29/2017, 1:46 PM

## 2017-05-29 NOTE — Progress Notes (Signed)
CCMD called RN that all leads were off, RN went to check on pt and pt was standing in the bathroom close to the shower with water running in the shower, with tele monitor on the floor disconnected from pt. Pt got angry at RN for opening the bathroom door to check, according to pt wasn't taking a shower but was just getting wash up.

## 2017-05-29 NOTE — Consult Note (Signed)
Cardiology Consult    Patient ID: April [name] MRN: [id number], DOB/AGE: 45-Jul-1972   Admit date: 05/26/2017 Date of Consult: 05/29/2017  Primary Physician: [name], MD Primary Cardiologist: New Requesting Provider: Tyrell Antonio Reason for Consultation: Afib RVR  [name] is a 47 y.o. female who is being seen today for the evaluation of Afib RVR at the request of Dr. Tyrell Antonio.  Patient Profile    47 yo female with PMH of asthma, COPD, GERD, HTN, OSA on cpap, and obesity who presented with COPD/asthma exacerbation. Developed Afib RVR.   Past Medical History   Past Medical History:  Diagnosis Date  . Acanthosis nigricans   . Anxiety   . Arthritis    "knees" (04/28/2017)  . Asthma    Followed by Dr. Annamaria Boots (pulmonology); receives every other week omalizumab injections; has frequent exacerbations  . COPD (chronic obstructive pulmonary disease) ([place])    PFTs in 2002, FEV1/FVC 65, no post bronchodilater test done  . Depression   . GERD (gastroesophageal reflux disease)   . GERD (gastroesophageal reflux disease)   . Headache(784.0)    "q couple days" (04/28/2017)  . Helicobacter pylori (H. pylori) infection   . Hypertension, essential   . Insomnia   . Menorrhagia   . Morbid obesity ([place])   . Obesity   . OSA on CPAP    Sleep study 2008 - mild OSA, not enough events to titrate CPAP; wears CPAP now/pt on 04/28/2017  . Pneumonia X 1  . Seasonal allergies   . Shortness of breath   . Tobacco user     Past Surgical History:  Procedure Laterality Date  . REDUCTION MAMMAPLASTY Bilateral 09/2011  . TUBAL LIGATION  1996   bilateral     Allergies  No Known Allergies  History of Present Illness    Mrs. April [name] is a 47 yo female with PMH of asthma, COPD, GERD, HTN, OSA on cpap, and obesity. Reports having been to a cardiologist in the past but can't remember what for. Hx of asthma and COPD, uses home inhalers. Developed shortness of breath several days prior to admission.  Inhalers weren't working. Also reported fever and chills. No chest pain, palpitations or dizziness. Had echo 6/18 showing normal EF with G1DD.   In the ED her labs showed leukocytosis, stable electrolytes, and H/H. CXR was negative. Initially required the use of Bipap was but able to be weaned. EKG showed SR with atrial enlargement. She was admitted for SIRS and placed on stepdown. Last night developed new onset Afib RVR with rates in the 150s, shortly after receiving Douneb Was given IV lopressor 5mg  x2 with little improvement, along with NS bolus. Started in [place] gtt, but rates are still elevated.    Inpatient Medications    . amLODipine  10 mg Oral Daily  . azithromycin  250 mg Oral Daily  . budesonide  0.25 mg Nebulization BID  . busPIRone  7.5 mg Oral BID  . enoxaparin (LOVENOX) injection  75 mg Subcutaneous Q24H  . famotidine  20 mg Oral BID  . fluticasone  2 spray Each Nare Daily  . furosemide  40 mg Intravenous BID  . guaiFENesin  600 mg Oral BID  . levalbuterol  0.63 mg Nebulization Q6H  . loratadine  10 mg Oral Daily  . losartan  100 mg Oral Daily  . methylPREDNISolone (SOLU-MEDROL) injection  60 mg Intravenous Q8H  . metoprolol tartrate  100 mg Oral BID  . montelukast  10 mg Oral  QHS  . nicotine  14 mg Transdermal Daily  . pantoprazole  40 mg Oral Daily  . sertraline  100 mg Oral Daily    Family History    Family History  Problem Relation Age of Onset  . Hypertension Mother   . Asthma Daughter   . Cancer Paternal Aunt   . Asthma Maternal Grandmother     Social History    Social History   Socioeconomic History  . Marital status: Single    Spouse name: Not on file  . Number of children: 1  . Years of education: Not on file  . Highest education level: Not on file  Social Needs  . Financial resource strain: Not on file  . Food insecurity - worry: Not on file  . Food insecurity - inability: Not on file  . Transportation needs - medical: Not on file  .  Transportation needs - non-medical: Not on file  Occupational History  . Occupation: CNA  Tobacco Use  . Smoking status: Former Smoker    Packs/day: 0.50    Years: 26.00    Pack years: 13.00    Types: Cigarettes    Last attempt to quit: 09/12/2014    Years since quitting: 2.7  . Smokeless tobacco: Never Used  Substance and Sexual Activity  . Alcohol use: No  . Drug use: No  . Sexual activity: No  Other Topics Concern  . 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     Review of Systems    See HPI  All other systems reviewed and are otherwise negative except as noted above.  Physical Exam    Blood pressure (!) 141/101, pulse (!) 139, temperature 98.2 F (36.8 C), temperature source Oral, resp. rate 18, height 5\' 6"  (1.676 m), weight (!) 335 lb 3.2 oz (152 kg), SpO2 100 %.  General: Young AAF, NAD Psych: Normal affect. Neuro: Alert and oriented X 3. Moves all extremities spontaneously. HEENT: Normal  Neck: Supple, difficult to assess JVD. Lungs:  Resp regular, mildly labored, expiratory wheezing throughout. Heart: Tachy, irreg no s3, s4, or murmurs. Abdomen: Soft, non-tender, non-distended, BS + x 4.  Extremities: No clubbing, cyanosis or edema. DP/PT/Radials 2+ and equal bilaterally.  Labs    Troponin (Point of Care Test) No results for input(s): TROPIPOC in the last 72 hours. No results for input(s): CKTOTAL, CKMB, TROPONINI in the last 72 hours. Lab Results  Component Value Date   WBC 25.8 (H) 05/29/2017   HGB 12.8 05/29/2017   HCT 41.0 05/29/2017   MCV 87.6 05/29/2017   PLT 374 05/29/2017    Recent Labs  Lab 05/29/17 0840  NA 137  K 3.9  CL 101  CO2 25  BUN 26*  CREATININE 1.03*  CALCIUM 8.3*  GLUCOSE 237*   Lab Results  Component Value Date   CHOL 183 10/06/2009   HDL 47 10/06/2009   LDLCALC 123 (H) 10/06/2009   TRIG 67 10/06/2009   Lab Results  Component Value Date   DDIMER 0.93 (H) 01/26/2017       Radiology Studies    Dg Chest 2 View  Result Date: 05/26/2017 CLINICAL DATA:  Reports asthma flair for a couple of days worse today. No relief using inhaler at home. Also reports non productive cough. EXAM: CHEST  2 VIEW COMPARISON:  Chest x-ray dated 04/28/2017. FINDINGS: Heart size and mediastinal contours are within normal limits. Lungs are clear. No pleural  effusion or pneumothorax seen. Osseous structures about the chest are unremarkable. IMPRESSION: No active cardiopulmonary disease. No evidence of pneumonia or pulmonary edema. Electronically Signed   By: Franki Cabot M.D.   On: 05/26/2017 01:14    ECG & Cardiac Imaging    EKG: Afib RVR  Echo: 6/18  Study Conclusions  - Left ventricle: The cavity size was normal. Wall thickness was   increased in a pattern of mild LVH. Systolic function was normal.   The estimated ejection fraction was in the range of 55% to 60%.   Although no diagnostic regional wall motion abnormality was   identified, this possibility cannot be completely excluded on the   basis of this study. Doppler parameters are consistent with   abnormal left ventricular relaxation (grade 1 diastolic   dysfunction). - Aortic valve: There was no stenosis. - Mitral valve: There was no significant regurgitation. - Right ventricle: The cavity size was normal. Systolic function   was normal. - Tricuspid valve: Peak RV-RA gradient (S): 27 mm Hg. - Pulmonary arteries: PA peak pressure: 30 mm Hg (S). - Inferior vena cava: The vessel was normal in size. The   respirophasic diameter changes were in the normal range (>= 50%),   consistent with normal central venous pressure.  Impressions:  - Normal LV size with mild LV hypertrophy. EF 55-60%. Normal RV   size and systolic function. No significant valvular   abnormalities  Assessment & Plan    46 yo female with PMH of asthma, COPD, GERD, HTN, OSA on cpap, and obesity who presented with COPD/asthma exacerbation.  Developed Afib RVR.   1. New onset Afib RVR: Developed onset that last night. Per her report was asymptomatic. No palpitations or fluttering prior to admission. Given IV lopressor 5mg  x2 without much change, given Dilt IV bolus and now on Dilt gtt. Rates are still elevated on review of telemetry. ChadsVasc of at least 3 for HTN, CHF and female. On metoprolol 100mg  BID already. -- will start Eliquis 5mg  BID (first dose now) -- may convert with Dilt, but will plan for DCCV in the am if not.   2. Acute asthma/COPD exacerbation: Initially placed on Bipap but able to wean to RA. Had been on dounebs, now switched to Xopenex given tachycardia.  -- antibiotics per primary team  3. HTN: On multiple therapies  4. OSA on cpap: Not compliant with Cpap at home, uses intermittently as she does not like the way the mask fits. Discussed the need for compliance and correlation with Afib. Reports Cpap is followed by Dr. Annamaria Boots as outpatient.   5. Chronic diastolic HF: Echo 6/96 with G1DD. Difficult to assess volume status, but does not appear overloaded. BNP 33, and no edema noted on CXR.   Barnet Pall, NP-C Pager [phone] 05/29/2017, 10:26 AM   I have seen and examined the patient along with Reino Bellis, NP-C.  I have reviewed the chart, notes and new data.  I agree with PA's note.  Key new complaints: only vaguely aware of palpitations. Dyspnea is attributable to reactive airway disease. Key examination changes: arrhythmia onset after admission to the hospital in NSR. Morbidly obese. Key new findings / data: echo reviewed (both studies from Feb and June 2018). Left atrium is normal in size. Evidence for diastolic heart failure is equivocal in my opinion.  PLAN: Start oral anticoagulants. Pros/cons/bleeding risk discussed. She is premenopausal and this may be a big challenge with menorrhagia. Will receive 3 doses of Eliquis by  the morning. Plan DC cardioversion if she does not  spontaneously return to normal rhythm overnight. Remain on anticoagulation indefinitely. Witnessed arrhythmia onset in hospital. I do not think a TEE is necessary before cardioversion. This procedure has been fully reviewed with the patient and written informed consent has been obtained. Long term, discussed direct relationship between OSA/CPAP compliance, obesity/weight loss and the burden of atrial fibrillation. Strongly encouraged 100% CPAP compliance. She wanted to have bariatric surgery, but was turned down due to the instability of her asthma. For rate control after DC, prefer calcium channel blockers due to reactive airway disease.  Sanda Klein, MD, [place] [phone] 05/29/2017, 11:53 AM

## 2017-05-29 NOTE — H&P (View-Only) (Signed)
Cardiology Consult    Patient ID: April [name] MRN: [id number], DOB/AGE: 12/17/1970   Admit date: 05/26/2017 Date of Consult: 05/29/2017  Primary Physician: [name], MD Primary Cardiologist: New Requesting Provider: Tyrell Antonio Reason for Consultation: Afib RVR  April [name] is a 47 y.o. female who is being seen today for the evaluation of Afib RVR at the request of Dr. Tyrell Antonio.  Patient Profile    47 yo female with PMH of asthma, COPD, GERD, HTN, OSA on cpap, and obesity who presented with COPD/asthma exacerbation. Developed Afib RVR.   Past Medical History   Past Medical History:  Diagnosis Date  . Acanthosis nigricans   . Anxiety   . Arthritis    "knees" (04/28/2017)  . Asthma    Followed by Dr. Annamaria Boots (pulmonology); receives every other week omalizumab injections; has frequent exacerbations  . COPD (chronic obstructive pulmonary disease) ([place])    PFTs in 2002, FEV1/FVC 65, no post bronchodilater test done  . Depression   . GERD (gastroesophageal reflux disease)   . GERD (gastroesophageal reflux disease)   . Headache(784.0)    "q couple days" (04/28/2017)  . Helicobacter pylori (H. pylori) infection   . Hypertension, essential   . Insomnia   . Menorrhagia   . Morbid obesity ([place])   . Obesity   . OSA on CPAP    Sleep study 2008 - mild OSA, not enough events to titrate CPAP; wears CPAP now/pt on 04/28/2017  . Pneumonia X 1  . Seasonal allergies   . Shortness of breath   . Tobacco user     Past Surgical History:  Procedure Laterality Date  . REDUCTION MAMMAPLASTY Bilateral 09/2011  . TUBAL LIGATION  1996   bilateral     Allergies  No Known Allergies  History of Present Illness    April [name] is a 47 yo female with PMH of asthma, COPD, GERD, HTN, OSA on cpap, and obesity. Reports having been to a cardiologist in the past but can't remember what for. Hx of asthma and COPD, uses home inhalers. Developed shortness of breath several days prior to admission.  Inhalers weren't working. Also reported fever and chills. No chest pain, palpitations or dizziness. Had echo 6/18 showing normal EF with G1DD.   In the ED her labs showed leukocytosis, stable electrolytes, and H/H. CXR was negative. Initially required the use of Bipap was but able to be weaned. EKG showed SR with atrial enlargement. She was admitted for SIRS and placed on stepdown. Last night developed new onset Afib RVR with rates in the 150s, shortly after receiving Douneb Was given IV lopressor 5mg  x2 with little improvement, along with NS bolus. Started in [place] gtt, but rates are still elevated.    Inpatient Medications    . amLODipine  10 mg Oral Daily  . azithromycin  250 mg Oral Daily  . budesonide  0.25 mg Nebulization BID  . busPIRone  7.5 mg Oral BID  . enoxaparin (LOVENOX) injection  75 mg Subcutaneous Q24H  . famotidine  20 mg Oral BID  . fluticasone  2 spray Each Nare Daily  . furosemide  40 mg Intravenous BID  . guaiFENesin  600 mg Oral BID  . levalbuterol  0.63 mg Nebulization Q6H  . loratadine  10 mg Oral Daily  . losartan  100 mg Oral Daily  . methylPREDNISolone (SOLU-MEDROL) injection  60 mg Intravenous Q8H  . metoprolol tartrate  100 mg Oral BID  . montelukast  10 mg Oral  QHS  . nicotine  14 mg Transdermal Daily  . pantoprazole  40 mg Oral Daily  . sertraline  100 mg Oral Daily    Family History    Family History  Problem Relation Age of Onset  . Hypertension Mother   . Asthma Daughter   . Cancer Paternal Aunt   . Asthma Maternal Grandmother     Social History    Social History   Socioeconomic History  . Marital status: Single    Spouse name: Not on file  . Number of children: 1  . Years of education: Not on file  . Highest education level: Not on file  Social Needs  . Financial resource strain: Not on file  . Food insecurity - worry: Not on file  . Food insecurity - inability: Not on file  . Transportation needs - medical: Not on file  .  Transportation needs - non-medical: Not on file  Occupational History  . Occupation: CNA  Tobacco Use  . Smoking status: Former Smoker    Packs/day: 0.50    Years: 26.00    Pack years: 13.00    Types: Cigarettes    Last attempt to quit: 09/12/2014    Years since quitting: 2.7  . Smokeless tobacco: Never Used  Substance and Sexual Activity  . Alcohol use: No  . Drug use: No  . Sexual activity: No  Other Topics Concern  . 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     Review of Systems    See HPI  All other systems reviewed and are otherwise negative except as noted above.  Physical Exam    Blood pressure (!) 141/101, pulse (!) 139, temperature 98.2 F (36.8 C), temperature source Oral, resp. rate 18, height 5\' 6"  (1.676 m), weight (!) 335 lb 3.2 oz (152 kg), SpO2 100 %.  General: Young AAF, NAD Psych: Normal affect. Neuro: Alert and oriented X 3. Moves all extremities spontaneously. HEENT: Normal  Neck: Supple, difficult to assess JVD. Lungs:  Resp regular, mildly labored, expiratory wheezing throughout. Heart: Tachy, irreg no s3, s4, or murmurs. Abdomen: Soft, non-tender, non-distended, BS + x 4.  Extremities: No clubbing, cyanosis or edema. DP/PT/Radials 2+ and equal bilaterally.  Labs    Troponin (Point of Care Test) No results for input(s): TROPIPOC in the last 72 hours. No results for input(s): CKTOTAL, CKMB, TROPONINI in the last 72 hours. Lab Results  Component Value Date   WBC 25.8 (H) 05/29/2017   HGB 12.8 05/29/2017   HCT 41.0 05/29/2017   MCV 87.6 05/29/2017   PLT 374 05/29/2017    Recent Labs  Lab 05/29/17 0840  NA 137  K 3.9  CL 101  CO2 25  BUN 26*  CREATININE 1.03*  CALCIUM 8.3*  GLUCOSE 237*   Lab Results  Component Value Date   CHOL 183 10/06/2009   HDL 47 10/06/2009   LDLCALC 123 (H) 10/06/2009   TRIG 67 10/06/2009   Lab Results  Component Value Date   DDIMER 0.93 (H) 01/26/2017       Radiology Studies    Dg Chest 2 View  Result Date: 05/26/2017 CLINICAL DATA:  Reports asthma flair for a couple of days worse today. No relief using inhaler at home. Also reports non productive cough. EXAM: CHEST  2 VIEW COMPARISON:  Chest x-ray dated 04/28/2017. FINDINGS: Heart size and mediastinal contours are within normal limits. Lungs are clear. No pleural  effusion or pneumothorax seen. Osseous structures about the chest are unremarkable. IMPRESSION: No active cardiopulmonary disease. No evidence of pneumonia or pulmonary edema. Electronically Signed   By: Franki Cabot M.D.   On: 05/26/2017 01:14    ECG & Cardiac Imaging    EKG: Afib RVR  Echo: 6/18  Study Conclusions  - Left ventricle: The cavity size was normal. Wall thickness was   increased in a pattern of mild LVH. Systolic function was normal.   The estimated ejection fraction was in the range of 55% to 60%.   Although no diagnostic regional wall motion abnormality was   identified, this possibility cannot be completely excluded on the   basis of this study. Doppler parameters are consistent with   abnormal left ventricular relaxation (grade 1 diastolic   dysfunction). - Aortic valve: There was no stenosis. - Mitral valve: There was no significant regurgitation. - Right ventricle: The cavity size was normal. Systolic function   was normal. - Tricuspid valve: Peak RV-RA gradient (S): 27 mm Hg. - Pulmonary arteries: PA peak pressure: 30 mm Hg (S). - Inferior vena cava: The vessel was normal in size. The   respirophasic diameter changes were in the normal range (>= 50%),   consistent with normal central venous pressure.  Impressions:  - Normal LV size with mild LV hypertrophy. EF 55-60%. Normal RV   size and systolic function. No significant valvular   abnormalities  Assessment & Plan    47 yo female with PMH of asthma, COPD, GERD, HTN, OSA on cpap, and obesity who presented with COPD/asthma exacerbation.  Developed Afib RVR.   1. New onset Afib RVR: Developed onset that last night. Per her report was asymptomatic. No palpitations or fluttering prior to admission. Given IV lopressor 5mg  x2 without much change, given Dilt IV bolus and now on Dilt gtt. Rates are still elevated on review of telemetry. ChadsVasc of at least 3 for HTN, CHF and female. On metoprolol 100mg  BID already. -- will start Eliquis 5mg  BID (first dose now) -- may convert with Dilt, but will plan for DCCV in the am if not.   2. Acute asthma/COPD exacerbation: Initially placed on Bipap but able to wean to RA. Had been on dounebs, now switched to Xopenex given tachycardia.  -- antibiotics per primary team  3. HTN: On multiple therapies  4. OSA on cpap: Not compliant with Cpap at home, uses intermittently as she does not like the way the mask fits. Discussed the need for compliance and correlation with Afib. Reports Cpap is followed by Dr. Annamaria Boots as outpatient.   5. Chronic diastolic HF: Echo 6/22 with G1DD. Difficult to assess volume status, but does not appear overloaded. BNP 33, and no edema noted on CXR.   Barnet Pall, NP-C Pager [phone] 05/29/2017, 10:26 AM   I have seen and examined the patient along with Reino Bellis, NP-C.  I have reviewed the chart, notes and new data.  I agree with PA's note.  Key new complaints: only vaguely aware of palpitations. Dyspnea is attributable to reactive airway disease. Key examination changes: arrhythmia onset after admission to the hospital in NSR. Morbidly obese. Key new findings / data: echo reviewed (both studies from Feb and June 2018). Left atrium is normal in size. Evidence for diastolic heart failure is equivocal in my opinion.  PLAN: Start oral anticoagulants. Pros/cons/bleeding risk discussed. She is premenopausal and this may be a big challenge with menorrhagia. Will receive 3 doses of Eliquis by  the morning. Plan DC cardioversion if she does not  spontaneously return to normal rhythm overnight. Remain on anticoagulation indefinitely. Witnessed arrhythmia onset in hospital. I do not think a TEE is necessary before cardioversion. This procedure has been fully reviewed with the patient and written informed consent has been obtained. Long term, discussed direct relationship between OSA/CPAP compliance, obesity/weight loss and the burden of atrial fibrillation. Strongly encouraged 100% CPAP compliance. She wanted to have bariatric surgery, but was turned down due to the instability of her asthma. For rate control after DC, prefer calcium channel blockers due to reactive airway disease.  Sanda Klein, MD, [place] [phone] 05/29/2017, 11:53 AM

## 2017-05-29 NOTE — Progress Notes (Signed)
RN went into room to meet patient. Patient stated that she would be getting into the shower in a little while. Day RN explained to patient that the MD asked that she not take off her tele due to her new onset of afib. Patient stated to this RN that she did not care what the MD said- she will be taking the tele off and taking a shower when she is ready. Both RN's told patient that this may not be the best idea since CCMD is currently dealing with a monitor issue- patient stated that she did not care and that she was going to let us know when she was ready to shower. Will update on call MD/NP

## 2017-05-29 NOTE — Progress Notes (Signed)
Pt in A fib with HR in the 150s-160s at 21:47. Pt without history of A fib. Pt had received Duoneb nebulizer at 20:50. MD paged. BP 139/98 at this time. EKG confirmed A fib with RVR. Orders received for Metoprolol 5mg  IV x1 and 523mL NS bolus x1. Will continue to monitor.

## 2017-05-29 NOTE — Anesthesia Preprocedure Evaluation (Addendum)
Anesthesia Evaluation  Patient identified by MRN, date of birth, ID band Patient awake    Reviewed: Allergy & Precautions, H&P , Patient's Chart, lab work & pertinent test results, reviewed documented beta blocker date and time   Airway Mallampati: III  TM Distance: >3 FB Neck ROM: full    Dental no notable dental hx.    Pulmonary asthma , sleep apnea , COPD, former smoker,    Pulmonary exam normal breath sounds clear to auscultation       Cardiovascular hypertension,  Rhythm:regular Rate:Normal     Neuro/Psych    GI/Hepatic   Endo/Other  Morbid obesity  Renal/GU      Musculoskeletal   Abdominal   Peds  Hematology   Anesthesia Other Findings Left ventricle:  The cavity size was normal. Wall thickness was increased in a pattern of mild LVH. Systolic function was normal. The estimated ejection fraction was in the range of 55% to 60%.  Reproductive/Obstetrics                            Anesthesia Physical Anesthesia Plan  ASA: III  Anesthesia Plan: General   Post-op Pain Management:    Induction: Intravenous  PONV Risk Score and Plan: Treatment may vary due to age or medical condition, Ondansetron and Dexamethasone  Airway Management Planned: Mask and Natural Airway  Additional Equipment:   Intra-op Plan:   Post-operative Plan: Extubation in OR  Informed Consent: I have reviewed the patients History and Physical, chart, labs and discussed the procedure including the risks, benefits and alternatives for the proposed anesthesia with the patient or authorized representative who has indicated his/her understanding and acceptance.   Dental Advisory Given  Plan Discussed with: CRNA and Surgeon  Anesthesia Plan Comments: (  )        Anesthesia Quick Evaluation

## 2017-05-29 NOTE — Progress Notes (Addendum)
Pt requesting shower, explain to pt that due to her new rhythm and been on Cardizem drip and tele monitor, she might not be able to take a shower, pt insisted that she wants one, MD paged with pt request, MD called RN back that pt can not take shower because she has to be on the monitor continuously, RN made pt aware of MD response, offered pt to take a bath at the sink but pt refused and said "Iwill figured something out".

## 2017-05-30 ENCOUNTER — Encounter (HOSPITAL_COMMUNITY): Payer: Self-pay | Admitting: *Deleted

## 2017-05-30 ENCOUNTER — Inpatient Hospital Stay (HOSPITAL_COMMUNITY): Payer: Medicaid Other | Admitting: Anesthesiology

## 2017-05-30 ENCOUNTER — Telehealth: Payer: Self-pay | Admitting: Family Medicine

## 2017-05-30 ENCOUNTER — Encounter (HOSPITAL_COMMUNITY)
Admission: EM | Disposition: A | Discharge status: Home / Self Care | Payer: Self-pay | Source: Home / Self Care | Attending: Internal Medicine

## 2017-05-30 DIAGNOSIS — I481 Persistent atrial fibrillation: Secondary | ICD-10-CM

## 2017-05-30 DIAGNOSIS — I5032 Chronic diastolic (congestive) heart failure: Secondary | ICD-10-CM

## 2017-05-30 DIAGNOSIS — I480 Paroxysmal atrial fibrillation: Secondary | ICD-10-CM

## 2017-05-30 HISTORY — PX: CARDIOVERSION: SHX1299

## 2017-05-30 LAB — BASIC METABOLIC PANEL
ANION GAP: 9 (ref 5–15)
BUN: 25 mg/dL — ABNORMAL HIGH (ref 6–20)
CHLORIDE: 102 mmol/L (ref 101–111)
CO2: 25 mmol/L (ref 22–32)
Calcium: 8.2 mg/dL — ABNORMAL LOW (ref 8.9–10.3)
Creatinine, Ser: 0.82 mg/dL (ref 0.44–1.00)
GFR calc Af Amer: >60 mL/min (ref >60)
GLUCOSE: 200 mg/dL — AB (ref 65–99)
POTASSIUM: 4 mmol/L (ref 3.5–5.1)
SODIUM: 136 mmol/L (ref 135–145)

## 2017-05-30 SURGERY — CARDIOVERSION
Anesthesia: General

## 2017-05-30 MED ORDER — SODIUM CHLORIDE 0.9 % IV SOLN
INTRAVENOUS | Status: DC | PRN
  Administered 2017-05-30: 08:00:00 via INTRAVENOUS

## 2017-05-30 MED ORDER — ALBUTEROL SULFATE (2.5 MG/3ML) 0.083% IN NEBU
INHALATION_SOLUTION | RESPIRATORY_TRACT | Status: AC
  Filled 2017-05-30: qty 3

## 2017-05-30 MED ORDER — LIDOCAINE 2% (20 MG/ML) 5 ML SYRINGE
INTRAMUSCULAR | Status: DC | PRN
  Administered 2017-05-30: 100 mg via INTRAVENOUS

## 2017-05-30 MED ORDER — PROPOFOL 10 MG/ML IV BOLUS
INTRAVENOUS | Status: DC | PRN
  Administered 2017-05-30: 90 mg via INTRAVENOUS

## 2017-05-30 MED ORDER — ALBUTEROL SULFATE (2.5 MG/3ML) 0.083% IN NEBU
2.5000 mg | INHALATION_SOLUTION | Freq: Once | RESPIRATORY_TRACT | Status: AC
  Administered 2017-05-30: 2.5 mg via RESPIRATORY_TRACT

## 2017-05-30 MED ORDER — DILTIAZEM HCL ER COATED BEADS 240 MG PO CP24
240.0000 mg | ORAL_CAPSULE | Freq: Every day | ORAL | Status: DC
  Administered 2017-05-30 – 2017-06-01 (×3): 240 mg via ORAL
  Filled 2017-05-30 (×3): qty 1

## 2017-05-30 NOTE — Progress Notes (Signed)
Pt back in the room, was educated that patient not allowed to go off the floor, significant other was also advised that patient can't leave the floor.

## 2017-05-30 NOTE — Progress Notes (Signed)
Pt noted to have pulled telemetry box off and gotten into the shower despite repeated instructions that the doctor did not want her to. Pt also unhooked her own cardizem gtt and left it dripping on the floor. When confronted, pt became agitated and argumentative, stating, "the box just came off", and "the IV just came out, I didn't do it". Charge nurse notified, in to see pt now.

## 2017-05-30 NOTE — Progress Notes (Addendum)
April [name]  IHK:[id number] DOB: 1970-11-22 DOA: 05/26/2017 PCP: [name], MD    Brief Narrative:  47 y.o. female with a history of COPD / asthma, HTN, GERD, depression, tobacco abuse, OSA on CPAP, and grade 1 diastolic CHF who presented with progressively worsening cough and shortness of breath for 4 days. In the ED she could not speak in complete sentences due to respiratory distress.  Her CXR was unrevealing.    Subjective: [name].,   Assessment & Plan:  SIRS w/ Acute on chronic hypoxic respiratory failure due to COPD/asthma exacerbation Required BIPAP initially. She was transition to 4 l oxygen. Today on RA.  Will continue with schedule nebulizer and IV lasix. Continue with IV solumedrol.  We discussed about weight lost, GERD prevention and avoidance of smoking.  Continue with azithromycin.  Continue with Pulmicort,  Montelukast. She is on chronic prednisone, 20 mg daily.  Improved.  Check vitamin d level.   Hypertensive urgency On Norvasc, cozaar.   A fib RVR; new diagnosis/  Was on  Cardizem gtt.  On metoprolol/  CHADS score 2. Cardiology consulted for recommendation for anticoagulation.  Started on eliquis.  Underwent cardioversion, appreciate Dr Sallyanne Kuster help.   GERD Cont pepcid  Depression Stable  OSA on CPAP at home - cont QHS CPAP or BIPAP while in hospital   Chronic diastolic CHF w/o acute exacerbation TTE 10/03/16 noted EF of 55-60% with grade 1 diastolic dysfunction -  Continue with IV lasix.   Filed Weights   05/27/17 0539 05/28/17 0541 05/30/17 0627  Weight: (!) 154.3 kg (340 lb 3.2 oz) (!) 152 kg (335 lb 3.2 oz) (!) 154.3 kg (340 lb 3.2 oz)  negative 3 L.  Mild increase cr, hold cozaar. Monitor on lasix.  Resume lasix.   Tobacco abuse Nicotine patch - counseled on abstinence  Morbid Obesity - Body mass index is 54.91 kg/m.  Leukocytosis; suspect related to steroids.    DVT prophylaxis: lovenox  Code Status: FULL  CODE Family Communication: no family present at time of exam  Disposition Plan: home when respiratory status improved.   Consultants:  none  Procedures: none  Antimicrobials:  Azithro 1/28 >  Objective: Blood pressure (!) 154/81, pulse 62, temperature 97.8 F (36.6 C), temperature source Oral, resp. rate (!) 21, height 5\' 6"  (1.676 m), weight (!) 154.3 kg (340 lb 3.2 oz), SpO2 98 %.  Intake/Output Summary (Last 24 hours) at 05/30/2017 1543 Last data filed at 05/30/2017 0826 Gross per 24 hour  Intake 978.67 ml  Output 0 ml  Net 978.67 ml   Filed Weights   05/27/17 0539 05/28/17 0541 05/30/17 0627  Weight: (!) 154.3 kg (340 lb 3.2 oz) (!) 152 kg (335 lb 3.2 oz) (!) 154.3 kg (340 lb 3.2 oz)    Examination: General: NAD Lungs: less wheezing, Normal respiratory effort. .  Cardiovascular: S 1, S 2 RRR Abdomen: BS present, soft, nt Extremities: trace edema     CBC: Recent Labs  Lab 05/26/17 0316 05/29/17 0601  WBC 24.6* 25.8*  HGB 12.9 12.8  HCT 40.7 41.0  MCV 87.0 87.6  PLT 375 756   Basic Metabolic Panel: Recent Labs  Lab 05/26/17 0316 05/28/17 0453 05/29/17 0840 05/30/17 0531  NA 137 136 137 136  K 3.8 3.7 3.9 4.0  CL 101 99* 101 102  CO2 22 26 25 25   GLUCOSE 125* 178* 237* 200*  BUN 15 20 26* 25*  CREATININE 0.90 0.88 1.03* 0.82  CALCIUM 8.6* 8.4* 8.3* 8.2*  MG  --  2.1  --   --    GFR: Estimated Creatinine Clearance: 134.5 mL/min (by C-G formula based on SCr of 0.82 mg/dL).  Liver Function Tests: No results for input(s): AST, ALT, ALKPHOS, BILITOT, PROT, ALBUMIN in the last 168 hours. No results for input(s): LIPASE, AMYLASE in the last 168 hours. No results for input(s): AMMONIA in the last 168 hours.  HbA1C: Hgb A1c MFr Bld  Date/Time Value Ref Range Status  04/28/2017 04:29 AM 5.6 4.8 - 5.6 % Final    Comment:    (NOTE) Pre diabetes:          5.7%-6.4% Diabetes:              >6.4% Glycemic control for   <7.0% adults with diabetes     02/04/2017 08:39 AM 6.1 (H) 4.8 - 5.6 % Final    Comment:    (NOTE) Pre diabetes:          5.7%-6.4% Diabetes:              >6.4% Glycemic control for   <7.0% adults with diabetes      Scheduled Meds: . apixaban  5 mg Oral BID  . budesonide  0.25 mg Nebulization BID  . busPIRone  7.5 mg Oral BID  . diltiazem  240 mg Oral Daily  . famotidine  20 mg Oral BID  . fluticasone  2 spray Each Nare Daily  . furosemide  40 mg Intravenous BID  . guaiFENesin  600 mg Oral BID  . levalbuterol  0.63 mg Nebulization Q6H  . loratadine  10 mg Oral Daily  . methylPREDNISolone (SOLU-MEDROL) injection  60 mg Intravenous Q8H  . metoprolol tartrate  100 mg Oral BID  . montelukast  10 mg Oral QHS  . nicotine  14 mg Transdermal Daily  . pantoprazole  40 mg Oral Daily  . sertraline  100 mg Oral Daily     LOS: 4 days    Niel Hummer, MD.  [phone] [facility]   On-Call/Text Page:      [email or web address]      password [email or web address]  If 7PM-7AM, please contact night-coverage [email or web address] Password [facility] 05/30/2017, 3:43 PM

## 2017-05-30 NOTE — Progress Notes (Signed)
RT asked pt about CPAP  but pt refused to wear it for the night.

## 2017-05-30 NOTE — Interval H&P Note (Signed)
History and Physical Interval Note:  05/30/2017 8:04 AM  April [name]  has presented today for surgery, with the diagnosis of AFIB  The various methods of treatment have been discussed with the patient and family. After consideration of risks, benefits and other options for treatment, the patient has consented to  Procedure(s): CARDIOVERSION (N/A) as a surgical intervention .  The patient's history has been reviewed, patient examined, no change in status, stable for surgery.  I have reviewed the patient's chart and labs.  Questions were answered to the patient's satisfaction.     Radiance Deady

## 2017-05-30 NOTE — Anesthesia Postprocedure Evaluation (Signed)
Anesthesia Post Note  Patient: April [name]  Procedure(s) Performed: CARDIOVERSION (N/A )     Patient location during evaluation: PACU Anesthesia Type: General Level of consciousness: awake and alert Pain management: pain level controlled Vital Signs Assessment: post-procedure vital signs reviewed and stable Respiratory status: spontaneous breathing, nonlabored ventilation, respiratory function stable and patient connected to nasal cannula oxygen Cardiovascular status: blood pressure returned to baseline and stable Postop Assessment: no apparent nausea or vomiting Anesthetic complications: no    Last Vitals:  Vitals:   05/30/17 0845 05/30/17 0940  BP: (!) 179/94 (!) 154/81  Pulse: 65 62  Resp: (!) 21   Temp:    SpO2: 97%     Last Pain:  Vitals:   05/30/17 0833  TempSrc: Oral  PainSc:                  Riccardo Dubin

## 2017-05-30 NOTE — Telephone Encounter (Signed)
Met with patient briefly in the hospital room. Patient stated that she was doing "ok". Informed patient about the [facility]CC clinic and patient agreed to be seen by provider. Patient believes that she will be discharged during the weekend therefore she requested for an afternoon appointment on Wednesday and Thursday.   Patient stated that she following her discharge she will have both family and friend support. At the moment she won't need transportation to her appointment but if anything changes, she will let us know. Scheduled appointment for 2/6 at 2pm. Informed patient that she will be receiving a call from Opal Sidles or myself following her discharge. Patient understood and had no further questions.

## 2017-05-30 NOTE — Progress Notes (Signed)
Pt refused bed alarm on, educated on pt's safety plan but pt continued to refused bed alarm.

## 2017-05-30 NOTE — Transfer of Care (Signed)
Immediate Anesthesia Transfer of Care Note  Patient: April [name]  Procedure(s) Performed: CARDIOVERSION (N/A )  Patient Location: Endoscopy Unit  Anesthesia Type:General  Level of Consciousness: awake, alert , oriented and patient cooperative  Airway & Oxygen Therapy: Patient Spontanous Breathing and Patient connected to face mask oxygen  Post-op Assessment: Report given to RN, Post -op Vital signs reviewed and stable and Patient moving all extremities X 4  Post vital signs: Reviewed and stable  Last Vitals:  Vitals:   05/30/17 0627 05/30/17 0713  BP: (!) 159/97 (!) 160/99  Pulse: 68 87  Resp: 18 20  Temp: 36.9 C 37.2 C  SpO2: 98% 99%    Last Pain:  Vitals:   05/30/17 0713  TempSrc: Oral  PainSc:       Patients Stated Pain Goal: 2 (20/25/42 7062)  Complications: No apparent anesthesia complications

## 2017-05-30 NOTE — Op Note (Signed)
Procedure: Electrical Cardioversion Indications:  Atrial Fibrillation  Procedure Details:  Consent: Risks of procedure as well as the alternatives and risks of each were explained to the (patient/caregiver).  Consent for procedure obtained.  Time Out: Verified patient identification, verified procedure, site/side was marked, verified correct patient position, special equipment/implants available, medications/allergies/relevent history reviewed, required imaging and test results available.  Performed  Patient placed on cardiac monitor, pulse oximetry, supplemental oxygen as necessary.  Sedation given: Propofol 90 mg IV Pacer pads placed anterior and posterior chest.  Cardioverted 1 time(s).  Cardioversion with synchronized biphasic 120J shock.  Evaluation: Findings: Post procedure EKG shows: NSR Complications: None Patient did tolerate procedure well.  Time Spent Directly with the Patient:  30 minutes   Judieth Mckown 05/30/2017, 8:34 AM

## 2017-05-30 NOTE — Progress Notes (Signed)
Eliquis coupon card given to patient with explanation of usage; pharmacy of choice is [facility]. Mindi Slicker [facility] [phone]

## 2017-05-30 NOTE — Progress Notes (Signed)
Patient calm and quiet the entire shift.

## 2017-05-30 NOTE — Anesthesia Procedure Notes (Signed)
Procedure Name: General with mask airway Date/Time: 05/30/2017 8:14 AM Performed by: Renato Shin, CRNA Pre-anesthesia Checklist: Patient identified, Emergency Drugs available, Suction available and Patient being monitored Patient Re-evaluated:Patient Re-evaluated prior to induction Oxygen Delivery Method: Ambu bag Preoxygenation: Pre-oxygenation with 100% oxygen Induction Type: IV induction Ventilation: Mask ventilation without difficulty Number of attempts: 1 Placement Confirmation: positive ETCO2,  CO2 detector and breath sounds checked- equal and bilateral Dental Injury: Teeth and Oropharynx as per pre-operative assessment

## 2017-05-30 NOTE — Progress Notes (Signed)
RN walked into room and smelled smoke. On-coming RN and charge nurse both walked in and spoke to patient about not smoking in the room. Patient denied smoking. Patient has been educated.

## 2017-05-30 NOTE — Progress Notes (Signed)
CCMD called patient is off the monitor, pt not in the room. Security notified.

## 2017-05-30 NOTE — Progress Notes (Signed)
Progress Note  Patient Name: April [name] Date of Encounter: 05/30/2017  Primary Cardiologist: No primary care provider on file.   Subjective   Wheezing quite a bit today, but remains unaware of palpitations.  Rate well controlled, but had some interruption in IV diltiazem overnight.  Inpatient Medications    Scheduled Meds: . [MAR Hold] amLODipine  10 mg Oral Daily  . [MAR Hold] apixaban  5 mg Oral BID  . [MAR Hold] azithromycin  250 mg Oral Daily  . [MAR Hold] budesonide  0.25 mg Nebulization BID  . [MAR Hold] busPIRone  7.5 mg Oral BID  . [MAR Hold] famotidine  20 mg Oral BID  . [MAR Hold] fluticasone  2 spray Each Nare Daily  . [MAR Hold] furosemide  40 mg Intravenous BID  . [MAR Hold] guaiFENesin  600 mg Oral BID  . [MAR Hold] levalbuterol  0.63 mg Nebulization Q6H  . [MAR Hold] loratadine  10 mg Oral Daily  . [MAR Hold] methylPREDNISolone (SOLU-MEDROL) injection  60 mg Intravenous Q8H  . [MAR Hold] metoprolol tartrate  100 mg Oral BID  . [MAR Hold] montelukast  10 mg Oral QHS  . [MAR Hold] nicotine  14 mg Transdermal Daily  . [MAR Hold] pantoprazole  40 mg Oral Daily  . [MAR Hold] sertraline  100 mg Oral Daily   Continuous Infusions: . [MAR Hold] diltiazem (CARDIZEM) infusion 10 mg/hr (05/30/17 0219)   PRN Meds: [MAR Hold] acetaminophen, [MAR Hold] butalbital-acetaminophen-caffeine, [MAR Hold] hydrALAZINE, [MAR Hold] levalbuterol, [MAR Hold] ondansetron (ZOFRAN) IV, [MAR Hold] traMADol, [MAR Hold] zolpidem   Vital Signs    Vitals:   05/29/17 2136 05/30/17 0201 05/30/17 0627 05/30/17 0713  BP: (!) 144/83  (!) 159/97 (!) 160/99  Pulse: (!) 103  68 87  Resp:   18 20  Temp:   98.4 F (36.9 C) 99 F (37.2 C)  TempSrc:   Oral Oral  SpO2:  97% 98% 99%  Weight:   (!) 340 lb 3.2 oz (154.3 kg)   Height:        Intake/Output Summary (Last 24 hours) at 05/30/2017 0816 Last data filed at 05/30/2017 0600 Gross per 24 hour  Intake 1553.67 ml  Output 0 ml  Net 1553.67  ml   Filed Weights   05/27/17 0539 05/28/17 0541 05/30/17 0627  Weight: (!) 340 lb 3.2 oz (154.3 kg) (!) 335 lb 3.2 oz (152 kg) (!) 340 lb 3.2 oz (154.3 kg)    Telemetry    AFib, rate controlled - Personally Reviewed  ECG    No new ecg - Personally Reviewed  Physical Exam  Morbidly obese GEN: No acute distress.   Neck: No JVD Cardiac: RRR, no murmurs, rubs, or gallops.  Respiratory: wheezing GI: Soft, nontender, non-distended  MS: No edema; No deformity. Neuro:  Nonfocal  Psych: Normal affect   Labs    Chemistry Recent Labs  Lab 05/28/17 0453 05/29/17 0840 05/30/17 0531  NA 136 137 136  K 3.7 3.9 4.0  CL 99* 101 102  CO2 26 25 25   GLUCOSE 178* 237* 200*  BUN 20 26* 25*  CREATININE 0.88 1.03* 0.82  CALCIUM 8.4* 8.3* 8.2*  GFRNONAA >60 >60 >60  GFRAA >60 >60 >60  ANIONGAP 11 11 9      Hematology Recent Labs  Lab 05/26/17 0316 05/29/17 0601  WBC 24.6* 25.8*  RBC 4.68 4.68  HGB 12.9 12.8  HCT 40.7 41.0  MCV 87.0 87.6  MCH 27.6 27.4  MCHC 31.7  31.2  RDW 17.3* 17.0*  PLT 375 374    Cardiac EnzymesNo results for input(s): TROPONINI in the last 168 hours. No results for input(s): TROPIPOC in the last 168 hours.   BNP Recent Labs  Lab 05/26/17 0757  BNP 33.9     DDimer No results for input(s): DDIMER in the last 168 hours.   Radiology    No results found.  Cardiac Studies    Echo: 6/18  Study Conclusions  - Left ventricle: The cavity size was normal. Wall thickness was increased in a pattern of mild LVH. Systolic function was normal. The estimated ejection fraction was in the range of 55% to 60%. Although no diagnostic regional wall motion abnormality was identified, this possibility cannot be completely excluded on the basis of this study. Doppler parameters are consistent with abnormal left ventricular relaxation (grade 1 diastolic dysfunction). - Aortic valve: There was no stenosis. - Mitral valve: There was no  significant regurgitation. - Right ventricle: The cavity size was normal. Systolic function was normal. - Tricuspid valve: Peak RV-RA gradient (S): 27 mm Hg. - Pulmonary arteries: PA peak pressure: 30 mm Hg (S). - Inferior vena cava: The vessel was normal in size. The respirophasic diameter changes were in the normal range (>= 50%), consistent with normal central venous pressure.  Impressions:  - Normal LV size with mild LV hypertrophy. EF 55-60%. Normal RV size and systolic function. No significant valvular abnormalities    Patient Profile     47 y.o. female with PMH of asthma, COPD, GERD, HTN, OSA on cpap, and obesity who presented with COPD/asthma exacerbation. Developed Afib RVR after admission.  Assessment & Plan    1. AFib:  Successful DCCV. Will switch diltiazem to PO and stop amlodipine. Reinforced mandatory compliance with anticoagulation to prevent stroke, especially in the next 30 days. 2. Asthma/COPD exacerbation: had to give nebs before cardiversion for severe wheezing 3. OSA:  Reminded her that compliance with CPAP will lessen burden of arrhythmia, as will weight loss 4. DCHF: hard to assess volume status by exam, but no overt CHF, BNP low. On my review of echo reports, the evidence of diastoli dysf is equivocal.    For questions or updates, please contact [place] Please consult [email or web address] for contact info under Cardiology/STEMI.      Signed, Sanda Klein, MD  05/30/2017, 8:16 AM

## 2017-05-31 LAB — RAPID URINE DRUG SCREEN, HOSP PERFORMED
Amphetamines: NOT DETECTED
Barbiturates: NOT DETECTED
Benzodiazepines: NOT DETECTED
Cocaine: NOT DETECTED
OPIATES: NOT DETECTED
Tetrahydrocannabinol: NOT DETECTED

## 2017-05-31 LAB — CULTURE, BLOOD (ROUTINE X 2)
CULTURE: NO GROWTH
Culture: NO GROWTH
Special Requests: ADEQUATE
Special Requests: ADEQUATE

## 2017-05-31 LAB — VITAMIN D 25 HYDROXY (VIT D DEFICIENCY, FRACTURES): VIT D 25 HYDROXY: 5.9 ng/mL — AB (ref 30.0–100.0)

## 2017-05-31 MED ORDER — NICOTINE 14 MG/24HR TD PT24: 14.0000 mg | MEDICATED_PATCH | TRANSDERMAL | Status: DC

## 2017-05-31 MED ORDER — LOSARTAN POTASSIUM 50 MG PO TABS
100.0000 mg | ORAL_TABLET | Freq: Every day | ORAL | Status: DC
  Administered 2017-05-31 – 2017-06-01 (×2): 100 mg via ORAL
  Filled 2017-05-31 (×2): qty 2

## 2017-05-31 MED ORDER — ALUM & MAG HYDROXIDE-SIMETH 200-200-20 MG/5ML PO SUSP: 15.0000 mL | Freq: Four times a day (QID) | ORAL | Status: DC | PRN

## 2017-05-31 MED ORDER — FUROSEMIDE 40 MG PO TABS
40.0000 mg | ORAL_TABLET | Freq: Every day | ORAL | Status: DC
  Administered 2017-06-01: 40 mg via ORAL
  Filled 2017-05-31: qty 1

## 2017-05-31 MED ORDER — IPRATROPIUM BROMIDE 0.02 % IN SOLN
0.5000 mg | Freq: Four times a day (QID) | RESPIRATORY_TRACT | Status: DC
  Administered 2017-05-31 – 2017-06-01 (×4): 0.5 mg via RESPIRATORY_TRACT
  Filled 2017-05-31 (×4): qty 2.5

## 2017-05-31 MED ORDER — VITAMIN D (ERGOCALCIFEROL) 1.25 MG (50000 UNIT) PO CAPS
50000.0000 [IU] | ORAL_CAPSULE | ORAL | Status: DC
  Administered 2017-05-31: 50000 [IU] via ORAL
  Filled 2017-05-31 (×2): qty 1

## 2017-05-31 NOTE — Progress Notes (Signed)
Pt refusing to take off nicotine patch until she sees MD Regalado

## 2017-05-31 NOTE — Progress Notes (Signed)
Pt refusing nicotine patch removal until after she showers  Asked pt to call RN prior to shower to remove telemetry box  Pt states she will call RN and shower in an hour

## 2017-05-31 NOTE — Care Management Note (Signed)
Case Management Note  Patient Details  Name: April [name] MRN: [id number] Date of Birth: 01-30-1971  Subjective/Objective:    Pt presented for respiratory failure secondary to COPD and CHF exacerbation.  Pt from home with family and independent.  Pt has Medicaid and states she has people to drive her to appointments and is able to get medications filled when she has the money.  Prescriptions are around $3/each.                 Action/Plan: Pt states she may need a new rescue inhaler and requests to be given one at d/c. An albuterol MDI is not currently ordered for her.  Pt had requested that CM advise her on Eliquis cost.  Per Medicaid formulary, Medicaid will cover and cost will be similar to other medications.   CM will continue to follow.     Expected Discharge Date:                  Expected Discharge Plan:  Home/Self Care  In-House Referral:  NA  Discharge planning Services  CM Consult, Medication Assistance  Post Acute Care Choice:    Choice offered to:     DME Arranged:    DME Agency:     HH Arranged:    HH Agency:     Status of Service:  In process, will continue to follow  If discussed at Long Length of Stay Meetings, dates discussed:    Additional Comments:  Arley Phenix, RN 05/31/2017, 10:46 AM

## 2017-05-31 NOTE — Progress Notes (Signed)
Progress Note  Patient Name: April [name] Date of Encounter: 05/31/2017  Primary Cardiologist: Sanda Klein, MD   Subjective   Feeling better.  Shortness of breath improving.  She never had palpitations when in atrial fibrillation  Inpatient Medications    Scheduled Meds: . apixaban  5 mg Oral BID  . budesonide  0.25 mg Nebulization BID  . busPIRone  7.5 mg Oral BID  . diltiazem  240 mg Oral Daily  . famotidine  20 mg Oral BID  . fluticasone  2 spray Each Nare Daily  . furosemide  40 mg Intravenous BID  . guaiFENesin  600 mg Oral BID  . ipratropium  0.5 mg Nebulization Q6H  . levalbuterol  0.63 mg Nebulization Q6H  . loratadine  10 mg Oral Daily  . methylPREDNISolone (SOLU-MEDROL) injection  60 mg Intravenous Q8H  . metoprolol tartrate  100 mg Oral BID  . montelukast  10 mg Oral QHS  . nicotine  14 mg Transdermal Daily  . pantoprazole  40 mg Oral Daily  . sertraline  100 mg Oral Daily  . Vitamin D (Ergocalciferol)  50,000 Units Oral Q7 days   Continuous Infusions:  PRN Meds: acetaminophen, alum & mag hydroxide-simeth, butalbital-acetaminophen-caffeine, hydrALAZINE, levalbuterol, ondansetron (ZOFRAN) IV, traMADol, zolpidem   Vital Signs    Vitals:   05/30/17 2038 05/31/17 0149 05/31/17 0649 05/31/17 0813  BP:   (!) 160/90   Pulse:   70   Resp:   20   Temp:   98.1 F (36.7 C)   TempSrc:   Oral   SpO2: 96% 98% 97% 96%  Weight:   (!) 337 lb 4.8 oz (153 kg)   Height:        Intake/Output Summary (Last 24 hours) at 05/31/2017 1027 Last data filed at 05/31/2017 0700 Gross per 24 hour  Intake 800 ml  Output 1220 ml  Net -420 ml   Filed Weights   05/28/17 0541 05/30/17 0627 05/31/17 0649  Weight: (!) 335 lb 3.2 oz (152 kg) (!) 340 lb 3.2 oz (154.3 kg) (!) 337 lb 4.8 oz (153 kg)    Telemetry    Sinus rhythm.- Personally Reviewed  ECG    05/30/17: Sinus rhythm.  Rate 73 bpm.  PVC.  Nonspecific ST changes.- Personally Reviewed  Physical Exam   VS:  BP (!)  160/90 (BP Location: Left Arm)   Pulse 70   Temp 98.1 F (36.7 C) (Oral)   Resp 20   Ht 5\' 6"  (1.676 m)   Wt (!) 337 lb 4.8 oz (153 kg)   SpO2 96%   BMI 54.44 kg/m   , BMI Body mass index is 54.44 kg/m. GENERAL:  Well appearing.  No acute distress.  Morbidly obese HEENT: Pupils equal round and reactive, fundi not visualized, oral mucosa unremarkable NECK:  No jugular venous distention, waveform within normal limits, carotid upstroke brisk and symmetric, no bruits, no thyromegaly LYMPHATICS:  No cervical adenopathy LUNGS: Expiratory wheezes HEART:  RRR.  PMI not displaced or sustained,S1 and S2 within normal limits, no S3, no S4, no clicks, no rubs, no murmurs ABD:  Flat, positive bowel sounds normal in frequency in pitch, no bruits, no rebound, no guarding, no midline pulsatile mass, no hepatomegaly, no splenomegaly EXT:  2 plus pulses throughout, no edema, no cyanosis no clubbing SKIN:  No rashes no nodules NEURO:  Cranial nerves II through XII grossly intact, motor grossly intact throughout PSYCH:  Cognitively intact, oriented to person place and time  Labs    Chemistry Recent Labs  Lab 05/28/17 0453 05/29/17 0840 05/30/17 0531  NA 136 137 136  K 3.7 3.9 4.0  CL 99* 101 102  CO2 26 25 25   GLUCOSE 178* 237* 200*  BUN 20 26* 25*  CREATININE 0.88 1.03* 0.82  CALCIUM 8.4* 8.3* 8.2*  GFRNONAA >60 >60 >60  GFRAA >60 >60 >60  ANIONGAP 11 11 9      Hematology Recent Labs  Lab 05/26/17 0316 05/29/17 0601  WBC 24.6* 25.8*  RBC 4.68 4.68  HGB 12.9 12.8  HCT 40.7 41.0  MCV 87.0 87.6  MCH 27.6 27.4  MCHC 31.7 31.2  RDW 17.3* 17.0*  PLT 375 374    Cardiac EnzymesNo results for input(s): TROPONINI in the last 168 hours. No results for input(s): TROPIPOC in the last 168 hours.   BNP Recent Labs  Lab 05/26/17 0757  BNP 33.9     DDimer No results for input(s): DDIMER in the last 168 hours.   Radiology    No results found.  Cardiac Studies   Echo  10/03/16: Study Conclusions  - Left ventricle: The cavity size was normal. Wall thickness was   increased in a pattern of mild LVH. Systolic function was normal.   The estimated ejection fraction was in the range of 55% to 60%.   Although no diagnostic regional wall motion abnormality was   identified, this possibility cannot be completely excluded on the   basis of this study. Doppler parameters are consistent with   abnormal left ventricular relaxation (grade 1 diastolic   dysfunction). - Aortic valve: There was no stenosis. - Mitral valve: There was no significant regurgitation. - Right ventricle: The cavity size was normal. Systolic function   was normal. - Tricuspid valve: Peak RV-RA gradient (S): 27 mm Hg. - Pulmonary arteries: PA peak pressure: 30 mm Hg (S). - Inferior vena cava: The vessel was normal in size. The   respirophasic diameter changes were in the normal range (>= 50%),   consistent with normal central venous pressure.  Impressions:  - Normal LV size with mild LV hypertrophy. EF 55-60%. Normal RV   size and systolic function. No significant valvular   abnormalities.  Patient Profile     47 y.o. female with COPD, asthma, hypertension, ongoing tobacco use, OSA on CPAP admitted with acute on chronic hypoxic respiratory failure secondary to a COPD/asthma exacerbation and found to have new onset atrial fibrillation with rapid ventricular response.  Assessment & Plan    # New onset atrial fibrillation; PAF: Ms. Bardwell underwent DCCV 05/30/17.  Amlodipine was switched to oral diltiazem.  She remains in sinus rhythm.  Continue Eliquis, diltiazem, and metoprolol.  # Hypertensive urgency:  BP remains poorly-controlled.  Continue amlodipine, diltiazem and metoprolol.  She is euvolemic.  We will switch back to her home Lasix 40 mg p.o. daily tomorrow.  Resume home losartan 100 mg p.o. daily today..   # OSA: Continue CPAP   For questions or updates, please contact [place] Please consult [email or web address] for contact info under Cardiology/STEMI.      Signed, Skeet Latch, MD  05/31/2017, 10:27 AM

## 2017-05-31 NOTE — Procedures (Signed)
Patient refused CPAP for the night.  Patient is aware to ask RN to call Respiratory if she changes her mind.

## 2017-05-31 NOTE — Progress Notes (Signed)
April [name]  QTM:[id number] DOB: [birth year] DOA: 05/26/2017 PCP: April Rakes, MD    Brief Narrative:  47 y.o. female with a history of COPD / asthma, HTN, GERD, depression, tobacco abuse, OSA on CPAP, and grade 1 diastolic CHF who presented with progressively worsening cough and shortness of breath for 4 days. In the ED she could not speak in complete sentences due to respiratory distress.  Her CXR was unrevealing.    Subjective: [name].,   Assessment & Plan:  SIRS w/ Acute on chronic hypoxic respiratory failure due to COPD/asthma exacerbation Required BIPAP initially. She was transition to 4 l oxygen. Today on RA.  Will continue with schedule nebulizer and IV lasix. Continue with IV solumedrol.  We discussed about weight lost, GERD prevention and avoidance of smoking.  Continue with azithromycin.  Continue with Pulmicort,  Montelukast. She is on chronic prednisone, 20 mg daily.  She is still not feeling at baseline. I will add ipratropium.  Will start Vitamin D supplements.   Hypertensive urgency On cozaar and now Cardizem.   A fib RVR; new diagnosis/  Was on  Cardizem gtt.  On metoprolol/  CHADS score 2. Cardiology consulted for recommendation for anticoagulation.  Started on eliquis.  Underwent cardioversion, appreciate April [name] help.  In sinus rhythm.   GERD Cont pepcid  Depression Stable  OSA on CPAP at home - cont QHS CPAP or BIPAP while in hospital   Chronic diastolic CHF w/o acute exacerbation TTE 10/03/16 noted EF of 55-60% with grade 1 diastolic dysfunction -   Filed Weights   05/28/17 0541 05/30/17 0627 05/31/17 0649  Weight: (!) 152 kg (335 lb 3.2 oz) (!) 154.3 kg (340 lb 3.2 oz) (!) 153 kg (337 lb 4.8 oz)  negative 1.7  L.  Resume lasix home dose lasix. .    Tobacco abuse Nicotine patch - counseled on abstinence  Morbid Obesity - Body mass index is 54.44 kg/m.  Leukocytosis; suspect related to steroids.    DVT  prophylaxis: lovenox  Code Status: FULL CODE Family Communication: no family present at time of exam  Disposition Plan: home when respiratory status improved.   Consultants:  none  Procedures: none  Antimicrobials:  Azithro 1/28 >  Objective: Blood pressure (!) 160/90, pulse 70, temperature 98.1 F (36.7 C), temperature source Oral, resp. rate 20, height 5\' 6"  (1.676 m), weight (!) 153 kg (337 lb 4.8 oz), SpO2 96 %.  Intake/Output Summary (Last 24 hours) at 05/31/2017 0928 Last data filed at 05/31/2017 0700 Gross per 24 hour  Intake 800 ml  Output 1220 ml  Net -420 ml   Filed Weights   05/28/17 0541 05/30/17 0627 05/31/17 0649  Weight: (!) 152 kg (335 lb 3.2 oz) (!) 154.3 kg (340 lb 3.2 oz) (!) 153 kg (337 lb 4.8 oz)    Examination: General: NAD Lungs: Bilateral wheezing.  Cardiovascular: S 1, S 2 RRR Abdomen:BS present, soft, nt Extremities: trace edema    CBC: Recent Labs  Lab 05/26/17 0316 05/29/17 0601  WBC 24.6* 25.8*  HGB 12.9 12.8  HCT 40.7 41.0  MCV 87.0 87.6  PLT 375 625   Basic Metabolic Panel: Recent Labs  Lab 05/26/17 0316 05/28/17 0453 05/29/17 0840 05/30/17 0531  NA 137 136 137 136  K 3.8 3.7 3.9 4.0  CL 101 99* 101 102  CO2 22 26 25 25   GLUCOSE 125* 178* 237* 200*  BUN 15 20 26* 25*  CREATININE 0.90 0.88 1.03*  0.82  CALCIUM 8.6* 8.4* 8.3* 8.2*  MG  --  2.1  --   --    GFR: Estimated Creatinine Clearance: 133.8 mL/min (by C-G formula based on SCr of 0.82 mg/dL).  Liver Function Tests: No results for input(s): AST, ALT, ALKPHOS, BILITOT, PROT, ALBUMIN in the last 168 hours. No results for input(s): LIPASE, AMYLASE in the last 168 hours. No results for input(s): AMMONIA in the last 168 hours.  HbA1C: Hgb A1c MFr Bld  Date/Time Value Ref Range Status  04/28/2017 04:29 AM 5.6 4.8 - 5.6 % Final    Comment:    (NOTE) Pre diabetes:          5.7%-6.4% Diabetes:              >6.4% Glycemic control for   <7.0% adults with diabetes     02/04/2017 08:39 AM 6.1 (H) 4.8 - 5.6 % Final    Comment:    (NOTE) Pre diabetes:          5.7%-6.4% Diabetes:              >6.4% Glycemic control for   <7.0% adults with diabetes      Scheduled Meds: . apixaban  5 mg Oral BID  . budesonide  0.25 mg Nebulization BID  . busPIRone  7.5 mg Oral BID  . diltiazem  240 mg Oral Daily  . famotidine  20 mg Oral BID  . fluticasone  2 spray Each Nare Daily  . furosemide  40 mg Intravenous BID  . guaiFENesin  600 mg Oral BID  . ipratropium  0.5 mg Nebulization Q6H  . levalbuterol  0.63 mg Nebulization Q6H  . loratadine  10 mg Oral Daily  . methylPREDNISolone (SOLU-MEDROL) injection  60 mg Intravenous Q8H  . metoprolol tartrate  100 mg Oral BID  . montelukast  10 mg Oral QHS  . nicotine  14 mg Transdermal Daily  . pantoprazole  40 mg Oral Daily  . sertraline  100 mg Oral Daily  . Vitamin D (Ergocalciferol)  50,000 Units Oral Q7 days     LOS: 5 days    April Hummer, MD.  [phone] [facility]   On-Call/Text Page:      [name]      password [email or web address]  If 7PM-7AM, please contact night-coverage [email or web address] Password [facility] 05/31/2017, 9:28 AM

## 2017-06-01 ENCOUNTER — Encounter (HOSPITAL_COMMUNITY): Payer: Self-pay | Admitting: Cardiovascular Disease

## 2017-06-01 MED ORDER — GUAIFENESIN ER 600 MG PO TB12: 600.0000 mg | ORAL_TABLET | Freq: Two times a day (BID) | ORAL | 0 refills | Status: DC

## 2017-06-01 MED ORDER — DILTIAZEM HCL ER COATED BEADS 240 MG PO CP24: 240.0000 mg | ORAL_CAPSULE | Freq: Every day | ORAL | 1 refills | Status: DC

## 2017-06-01 MED ORDER — LEVALBUTEROL HCL 0.63 MG/3ML IN NEBU: 0.6300 mg | INHALATION_SOLUTION | Freq: Four times a day (QID) | RESPIRATORY_TRACT | 12 refills | Status: DC

## 2017-06-01 MED ORDER — APIXABAN 5 MG PO TABS: 5.0000 mg | ORAL_TABLET | Freq: Two times a day (BID) | ORAL | 1 refills | Status: DC

## 2017-06-01 MED ORDER — PREDNISONE 20 MG PO TABS: ORAL_TABLET | ORAL | 0 refills | Status: DC

## 2017-06-01 MED ORDER — VITAMIN D (ERGOCALCIFEROL) 1.25 MG (50000 UNIT) PO CAPS: 50000.0000 [IU] | ORAL_CAPSULE | ORAL | 0 refills | Status: DC

## 2017-06-01 NOTE — Discharge Instructions (Signed)
**  PLEASE REMEMBER TO BRING ALL OF YOUR MEDICATIONS TO EACH OF YOUR FOLLOW-UP OFFICE VISITS. ° °  ° °10 Habits of Highly Healthy People ° °[facility] wants to help you get well and stay well.  Live a longer, healthier life by practicing healthy habits every day. ° °1.  Visit your primary care provider regularly. °2.  Make time for family and friends.  Healthy relationships are important. °3.  Take medications as directed by your provider. °4.  Maintain a healthy weight and a trim waistline. °5.  Eat healthy meals and snacks, rich in fruits, vegetables, whole grains, and lean proteins. °6.  Get moving every day - aim for 150 minutes of moderate physical activity each week. °7.  Don't smoke. °8.  Avoid alcohol or drink in moderation. °9.  Manage stress through meditation or mindful relaxation. °10.  Get seven to nine hours of quality sleep each night. ° °Want more information on healthy habits?  To learn more about these and other healthy habits, visit [email or web address]. °_____________ °  ° °  ° °You have received care from [facility] during this hospital stay and we look forward to continuing to provide you with excellent care in our office settings after you've left the hospital.  In order to assure a smoother transition to home following your discharge from the hospital, we will likely have you see one of our nurse practitioners or physician assistants within a few weeks of discharge.  Our advanced practice providers work closely with your physician in order to address all of your heart's needs in a timely manner.  More information about all of our providers may be found here: [email or web address] ° °Please plan to bring all of your prescriptions to your follow-up appointment and don't hesitate to contact us with questions or concerns. ° °[facility] - [phone] °[facility] - [phone] °[facility] [address] - [phone] °[facility] [place] - [phone] °[facility] [place] - [phone] °[facility] [place] - [phone] °[facility] [place] - [phone] °[facility] - [phone] °[facility] [place] - [phone]  ° °

## 2017-06-01 NOTE — Discharge Summary (Addendum)
Physician Discharge Summary  April [name] ZOX:[id number] DOB: [birth year] DOA: 05/26/2017  PCP: [name], MD  Admit date: 05/26/2017 Discharge date: 06/01/2017  Admitted From: Home  Disposition: home   Recommendations for Outpatient Follow-up:  1. Follow up with PCP in 1-2 weeks 2. Please obtain BMP/CBC in one week 3. Counseling regarding smoking cessation.  4. Needs repeat vitamin D level. And further supplementation.    Home Health: none  Discharge Condition: stable.  CODE STATUS: full code.  Diet recommendation: Heart Healthy   Brief/Interim Summary: Brief Narrative:  47 y.o.femalewith a history ofCOPD / asthma, HTN, GERD, depression, tobacco abuse, OSA on CPAP, and grade 1 diastolic CHF who presented with progressively worsening cough and shortness of breath for 4 days. In the ED she could not speak in complete sentences due to respiratory distress.  Her CXR was unrevealing.    Subjective: [name].,   Assessment & Plan:  SIRS w/ Acute on chronic hypoxic respiratory failure due toCOPD/asthmaexacerbation Required BIPAP initially. She was transition to 4 l oxygen. Today on RA.  Treated with  schedule nebulizer and IV lasix,  IV solumedrol.  We discussed about weight lost, GERD prevention and avoidance of smoking.  Received azithromycin   Continue with Pulmicort,  Montelukast. She is on chronic prednisone, 20 mg daily.  Stable. [name], wants to be discharge  Started  Vitamin D supplements. hopefully that will help with her asthma management.   Hypertensive urgency On cozaar and now Cardizem.   A fib RVR; new diagnosis/  Was on  Cardizem gtt.  On metoprolol/  CHADS score 2. Cardiology consulted for recommendation for anticoagulation.  Started on eliquis.  Underwent cardioversion, appreciate Dr Sallyanne Kuster help.  In sinus rhythm.  discharge on eliquis. She was counseling extensively regarding necessity to take eliquis post  cardioversion.   GERD Cont pepcid  Depression Stable  OSA onCPAPat home - cont QHS CPAP or BIPAP while in hospital   Chronic diastolic CHF w/o acute exacerbation TTE 10/03/16 noted EF of 55-60% with grade 1 diastolic dysfunction -        Filed Weights   05/28/17 0541 05/30/17 0627 05/31/17 0649  Weight: (!) 152 kg (335 lb 3.2 oz) (!) 154.3 kg (340 lb 3.2 oz) (!) 153 kg (337 lb 4.8 oz)  negative 1.7  L.  Resume lasix home dose lasix. .    Tobacco abuse Nicotine patch - counseled on abstinence  Morbid Obesity - Body mass index is 54.44 kg/m.  Leukocytosis; suspect related to steroids.   Sepsis was ruled out.    Discharge Diagnoses:  Principal Problem:   Acute on chronic respiratory failure with hypoxia (HCC) Active Problems:   Morbid obesity with body mass index of 50.0-59.9 in adult [facility])   Major depressive disorder, recurrent episode ([place])   Essential hypertension   GERD (gastroesophageal reflux disease)   Hypertensive urgency   Asthma exacerbation in COPD (HCC)   Chronic diastolic CHF (congestive heart failure) (HCC)   OSA (obstructive sleep apnea)   COPD with acute exacerbation (HCC)   COPD exacerbation (HCC)   Persistent atrial fibrillation [facility])    Discharge Instructions  Discharge Instructions    Diet - low sodium heart healthy   Complete by:  As directed    Increase activity slowly   Complete by:  As directed      Allergies as of 06/01/2017   No Known Allergies     Medication List    STOP taking these  medications   amLODipine 10 MG tablet Commonly known as:  NORVASC   doxycycline 100 MG tablet Commonly known as:  VIBRA-TABS     TAKE these medications   albuterol 108 (90 Base) MCG/ACT inhaler Commonly known as:  PROVENTIL HFA;VENTOLIN HFA Inhale 2 puffs every 4 (four) hours as needed into the lungs for wheezing or shortness of breath.   apixaban 5 MG Tabs tablet Commonly known as:  ELIQUIS Take 1 tablet (5 mg total) by mouth  2 (two) times daily.   arformoterol 15 MCG/2ML Nebu Commonly known as:  BROVANA Take 2 mLs (15 mcg total) 2 (two) times daily by nebulization.   budesonide 0.25 MG/2ML nebulizer solution Commonly known as:  PULMICORT Take 2 mLs (0.25 mg total) 2 (two) times daily by nebulization.   busPIRone 7.5 MG tablet Commonly known as:  BUSPAR Take 1 tablet (7.5 mg total) 2 (two) times daily by mouth.   butalbital-acetaminophen-caffeine 50-325-40 MG tablet Commonly known as:  FIORICET, ESGIC Take 1-2 tablets by mouth every 6 (six) hours as needed for headache.   diltiazem 240 MG 24 hr capsule Commonly known as:  CARDIZEM CD Take 1 capsule (240 mg total) by mouth daily. Start taking on:  06/02/2017   famotidine 20 MG tablet Commonly known as:  PEPCID Take 1 tablet (20 mg total) by mouth 2 (two) times daily.   fluticasone 50 MCG/ACT nasal spray Commonly known as:  FLONASE Place 2 sprays into both nostrils daily.   furosemide 40 MG tablet Commonly known as:  LASIX Take 1 tablet (40 mg total) daily by mouth.   guaiFENesin 600 MG 12 hr tablet Commonly known as:  MUCINEX Take 1 tablet (600 mg total) by mouth 2 (two) times daily.   levalbuterol 0.63 MG/3ML nebulizer solution Commonly known as:  XOPENEX Take 3 mLs (0.63 mg total) by nebulization every 6 (six) hours.   loratadine 10 MG tablet Commonly known as:  CLARITIN Take 1 tablet (10 mg total) by mouth daily.   losartan 100 MG tablet Commonly known as:  COZAAR Take 1 tablet (100 mg total) daily by mouth.   metoprolol tartrate 100 MG tablet Commonly known as:  LOPRESSOR Take 1 tablet (100 mg total) by mouth 2 (two) times daily.   montelukast 10 MG tablet Commonly known as:  SINGULAIR Take 1 tablet (10 mg total) at bedtime by mouth.   nicotine 14 mg/24hr patch Commonly known as:  NICODERM CQ - dosed in mg/24 hours Place 1 patch (14 mg total) onto the skin daily.   omeprazole 20 MG capsule Commonly known as:  PRILOSEC Take  1 capsule (20 mg total) by mouth 2 (two) times daily.   predniSONE 20 MG tablet Commonly known as:  DELTASONE Take 3 tablet by mouth for 5  days then 2 tablets for 3 days then go back to 1 tablet daily What changed:    how much to take  how to take this  when to take this  additional instructions  Another medication with the same name was removed. Continue taking this medication, and follow the directions you see here.   sertraline 100 MG tablet Commonly known as:  ZOLOFT Take 1 tablet (100 mg total) daily by mouth.   spironolactone 100 MG tablet Commonly known as:  ALDACTONE Take 1 tablet (100 mg total) daily by mouth.   Vitamin D (Ergocalciferol) 50000 units Caps capsule Commonly known as:  DRISDOL Take 1 capsule (50,000 Units total) by mouth every 7 (seven) days. Start  taking on:  06/07/2017   zolpidem 10 MG tablet Commonly known as:  AMBIEN Take 10 mg by mouth at bedtime.       No Known Allergies  Consultations:  Cardiology    Procedures/Studies: Dg Chest 2 View  Result Date: 05/26/2017 CLINICAL DATA:  Reports asthma flair for a couple of days worse today. No relief using inhaler at home. Also reports non productive cough. EXAM: CHEST  2 VIEW COMPARISON:  Chest x-ray dated 04/28/2017. FINDINGS: Heart size and mediastinal contours are within normal limits. Lungs are clear. No pleural effusion or pneumothorax seen. Osseous structures about the chest are unremarkable. IMPRESSION: No active cardiopulmonary disease. No evidence of pneumonia or pulmonary edema. Electronically Signed   By: Franki Cabot M.D.   On: 05/26/2017 01:14       Subjective: [name]. Almost at baseline.   Discharge Exam: Vitals:   06/01/17 0545 06/01/17 0718  BP:    Pulse: 69   Resp: 18   Temp:    SpO2: 97% 98%   Vitals:   06/01/17 0204 06/01/17 0518 06/01/17 0545 06/01/17 0718  BP:  (!) 150/63    Pulse: 62 63 69   Resp: 17 18 18    Temp:  98.4 F (36.9 C)     TempSrc:  Oral    SpO2: 99% 96% 97% 98%  Weight:  (!) 152.7 kg (336 lb 11.2 oz)    Height:        General: Pt is alert, awake, not in acute distress Cardiovascular: RRR, S1/S2 +, no rubs, no gallops Respiratory: CTA bilaterally, sporadic  wheezing, no rhonchi Abdominal: Soft, NT, ND, bowel sounds + Extremities: no edema, no cyanosis    The results of significant diagnostics from this hospitalization (including imaging, microbiology, ancillary and laboratory) are listed below for reference.     Microbiology: Recent Results (from the past 240 hour(s))  Culture, blood (x 2)     Status: None   Collection Time: 05/26/17  8:03 AM  Result Value Ref Range Status   Specimen Description BLOOD RIGHT HAND  Final   Special Requests   Final    BOTTLES DRAWN AEROBIC AND ANAEROBIC Blood Culture adequate volume   Culture   Final    NO GROWTH 5 DAYS Performed at [facility], [address] [place], [place] [address]    Report Status 05/31/2017 FINAL  Final  Culture, blood (x 2)     Status: None   Collection Time: 05/26/17  8:10 AM  Result Value Ref Range Status   Specimen Description BLOOD WRIST RIGHT  Final   Special Requests   Final    BOTTLES DRAWN AEROBIC AND ANAEROBIC Blood Culture adequate volume   Culture   Final    NO GROWTH 5 DAYS Performed at [facility], [place] [address], [place], [place] [address]    Report Status 05/31/2017 FINAL  Final  Respiratory Panel by PCR     Status: None   Collection Time: 05/26/17  9:43 AM  Result Value Ref Range Status   Adenovirus NOT DETECTED NOT DETECTED Final   Coronavirus 229E NOT DETECTED NOT DETECTED Final   Coronavirus HKU1 NOT DETECTED NOT DETECTED Final   Coronavirus NL63 NOT DETECTED NOT DETECTED Final   Coronavirus OC43 NOT DETECTED NOT DETECTED Final   Metapneumovirus NOT DETECTED NOT DETECTED Final   Rhinovirus / Enterovirus NOT DETECTED NOT DETECTED Final   Influenza A NOT DETECTED NOT DETECTED Final   Influenza  B  NOT DETECTED NOT DETECTED Final   Parainfluenza Virus 1 NOT DETECTED NOT DETECTED Final   Parainfluenza Virus 2 NOT DETECTED NOT DETECTED Final   Parainfluenza Virus 3 NOT DETECTED NOT DETECTED Final   Parainfluenza Virus 4 NOT DETECTED NOT DETECTED Final   Respiratory Syncytial Virus NOT DETECTED NOT DETECTED Final   Bordetella pertussis NOT DETECTED NOT DETECTED Final   Chlamydophila pneumoniae NOT DETECTED NOT DETECTED Final   Mycoplasma pneumoniae NOT DETECTED NOT DETECTED Final  MRSA PCR Screening     Status: None   Collection Time: 05/26/17  5:55 PM  Result Value Ref Range Status   MRSA by PCR NEGATIVE NEGATIVE Final    Comment:        The GeneXpert MRSA Assay (FDA approved for NASAL specimens only), is one component of a comprehensive MRSA colonization surveillance program. It is not intended to diagnose MRSA infection nor to guide or monitor treatment for MRSA infections.      Labs: BNP (last 3 results) Recent Labs    02/14/17 2105 04/27/17 1254 05/26/17 0757  BNP 15.7 21.7 16.1   Basic Metabolic Panel: Recent Labs  Lab 05/26/17 0316 05/28/17 0453 05/29/17 0840 05/30/17 0531  NA 137 136 137 136  K 3.8 3.7 3.9 4.0  CL 101 99* 101 102  CO2 22 26 25 25   GLUCOSE 125* 178* 237* 200*  BUN 15 20 26* 25*  CREATININE 0.90 0.88 1.03* 0.82  CALCIUM 8.6* 8.4* 8.3* 8.2*  MG  --  2.1  --   --    Liver Function Tests: No results for input(s): AST, ALT, ALKPHOS, BILITOT, PROT, ALBUMIN in the last 168 hours. No results for input(s): LIPASE, AMYLASE in the last 168 hours. No results for input(s): AMMONIA in the last 168 hours. CBC: Recent Labs  Lab 05/26/17 0316 05/29/17 0601  WBC 24.6* 25.8*  HGB 12.9 12.8  HCT 40.7 41.0  MCV 87.0 87.6  PLT 375 374   Cardiac Enzymes: No results for input(s): CKTOTAL, CKMB, CKMBINDEX, TROPONINI in the last 168 hours. BNP: Invalid input(s): POCBNP CBG: No results for input(s): GLUCAP in the last 168 hours. D-Dimer No  results for input(s): DDIMER in the last 72 hours. Hgb A1c No results for input(s): HGBA1C in the last 72 hours. Lipid Profile No results for input(s): CHOL, HDL, LDLCALC, TRIG, CHOLHDL, LDLDIRECT in the last 72 hours. Thyroid function studies No results for input(s): TSH, T4TOTAL, T3FREE, THYROIDAB in the last 72 hours.  Invalid input(s): FREET3 Anemia work up No results for input(s): VITAMINB12, FOLATE, FERRITIN, TIBC, IRON, RETICCTPCT in the last 72 hours. Urinalysis    Component Value Date/Time   COLORURINE YELLOW 04/29/2017 0519   APPEARANCEUR HAZY (A) 04/29/2017 0519   LABSPEC 1.008 04/29/2017 0519   PHURINE 6.0 04/29/2017 0519   GLUCOSEU NEGATIVE 04/29/2017 0519   GLUCOSEU NEG mg/dL 10/28/2007 2049   HGBUR NEGATIVE 04/29/2017 0519   BILIRUBINUR NEGATIVE 04/29/2017 0519   KETONESUR NEGATIVE 04/29/2017 0519   PROTEINUR NEGATIVE 04/29/2017 0519   UROBILINOGEN 1.0 11/21/2014 0707   NITRITE NEGATIVE 04/29/2017 0519   LEUKOCYTESUR NEGATIVE 04/29/2017 0519   Sepsis Labs Invalid input(s): PROCALCITONIN,  WBC,  LACTICIDVEN Microbiology Recent Results (from the past 240 hour(s))  Culture, blood (x 2)     Status: None   Collection Time: 05/26/17  8:03 AM  Result Value Ref Range Status   Specimen Description BLOOD RIGHT HAND  Final   Special Requests   Final    BOTTLES DRAWN  AEROBIC AND ANAEROBIC Blood Culture adequate volume   Culture   Final    NO GROWTH 5 DAYS Performed at [facility], [place] [address] [place], [place] [address]    Report Status 05/31/2017 FINAL  Final  Culture, blood (x 2)     Status: None   Collection Time: 05/26/17  8:10 AM  Result Value Ref Range Status   Specimen Description BLOOD WRIST RIGHT  Final   Special Requests   Final    BOTTLES DRAWN AEROBIC AND ANAEROBIC Blood Culture adequate volume   Culture   Final    NO GROWTH 5 DAYS Performed at [facility], [place] [address], [place], [place] [address]    Report Status 05/31/2017 FINAL   Final  Respiratory Panel by PCR     Status: None   Collection Time: 05/26/17  9:43 AM  Result Value Ref Range Status   Adenovirus NOT DETECTED NOT DETECTED Final   Coronavirus 229E NOT DETECTED NOT DETECTED Final   Coronavirus HKU1 NOT DETECTED NOT DETECTED Final   Coronavirus NL63 NOT DETECTED NOT DETECTED Final   Coronavirus OC43 NOT DETECTED NOT DETECTED Final   Metapneumovirus NOT DETECTED NOT DETECTED Final   Rhinovirus / Enterovirus NOT DETECTED NOT DETECTED Final   Influenza A NOT DETECTED NOT DETECTED Final   Influenza B NOT DETECTED NOT DETECTED Final   Parainfluenza Virus 1 NOT DETECTED NOT DETECTED Final   Parainfluenza Virus 2 NOT DETECTED NOT DETECTED Final   Parainfluenza Virus 3 NOT DETECTED NOT DETECTED Final   Parainfluenza Virus 4 NOT DETECTED NOT DETECTED Final   Respiratory Syncytial Virus NOT DETECTED NOT DETECTED Final   Bordetella pertussis NOT DETECTED NOT DETECTED Final   Chlamydophila pneumoniae NOT DETECTED NOT DETECTED Final   Mycoplasma pneumoniae NOT DETECTED NOT DETECTED Final  MRSA PCR Screening     Status: None   Collection Time: 05/26/17  5:55 PM  Result Value Ref Range Status   MRSA by PCR NEGATIVE NEGATIVE Final    Comment:        The GeneXpert MRSA Assay (FDA approved for NASAL specimens only), is one component of a comprehensive MRSA colonization surveillance program. It is not intended to diagnose MRSA infection nor to guide or monitor treatment for MRSA infections.      Time coordinating discharge: Over 30 minutes  SIGNED:   Elmarie Shiley, MD  [facility] 06/01/2017, 9:42 AM Pager [phone]  If 7PM-7AM, please contact night-coverage [email or web address] Password [email or web address]

## 2017-06-01 NOTE — Progress Notes (Signed)
Pt proceeded to shower this am without alerting staff to safeguard IV site.  Patient overflowed the shower with water running out into the room and hallway.   IV site lost, site discontinued.   Pt requesting to be discharged at this time. MD in this am and discharge orders obtained. All discharge orders, meds and follow up appts reviewed with patient. RX given to patient.   Stressed/urged patient to be compliant with follow up appts to avoid future hospitalizations.   Pt in no distress at this time. All personal belongings with pt.

## 2017-06-02 ENCOUNTER — Telehealth: Payer: Self-pay

## 2017-06-02 NOTE — Telephone Encounter (Signed)
[facility] Post-discharge Follow-Up Phone Call:  Date of Discharge:  06/01/2017 Principal Discharge Diagnosis(es):  Acute on chronic respiratory failure with hypoxia, morbid obesity, major depressive disorder, asthma exacerbation, chronic diastolic CHF Post-discharge Communication: (Clearly document all attempts clearly and date contact made) call placed to # [phone] (M) and a HIPAA compliant voicemail message was left requesting a call back to # [phone].  Call Completed: No

## 2017-06-03 ENCOUNTER — Telehealth: Payer: Self-pay

## 2017-06-03 NOTE — Telephone Encounter (Signed)
[facility] Post-discharge Follow-Up Phone Call:  Date of Discharge:  06/01/2017 Principal Discharge Diagnosis(es):  Acute on chronic respiratory failure with hypoxia, morbid obesity, major depressive disorder, asthma exacerbation, chronic diastolic CHF Post-discharge Communication: (Clearly document all attempts clearly and date contact made) call placed to # [phone] (M) and a HIPAA compliant voicemail message was left requesting a call back to # [phone].  Call Completed: No

## 2017-06-04 ENCOUNTER — Inpatient Hospital Stay: Payer: Medicaid Other | Admitting: Family Medicine

## 2017-06-05 ENCOUNTER — Other Ambulatory Visit: Payer: Self-pay

## 2017-06-05 ENCOUNTER — Encounter (HOSPITAL_COMMUNITY): Payer: Self-pay

## 2017-06-05 ENCOUNTER — Inpatient Hospital Stay (HOSPITAL_COMMUNITY)
Admission: EM | Admit: 2017-06-05 | Discharge: 2017-06-08 | DRG: 191 | Disposition: A | Discharge status: Home / Self Care | Payer: Medicaid Other | Attending: Family Medicine | Admitting: Family Medicine

## 2017-06-05 ENCOUNTER — Emergency Department (HOSPITAL_COMMUNITY): Payer: Medicaid Other

## 2017-06-05 DIAGNOSIS — I10 Essential (primary) hypertension: Secondary | ICD-10-CM | POA: Diagnosis present

## 2017-06-05 DIAGNOSIS — J4489 Other specified chronic obstructive pulmonary disease: Secondary | ICD-10-CM

## 2017-06-05 DIAGNOSIS — I4891 Unspecified atrial fibrillation: Secondary | ICD-10-CM | POA: Diagnosis present

## 2017-06-05 DIAGNOSIS — Z9989 Dependence on other enabling machines and devices: Secondary | ICD-10-CM

## 2017-06-05 DIAGNOSIS — J441 Chronic obstructive pulmonary disease with (acute) exacerbation: Secondary | ICD-10-CM | POA: Diagnosis not present

## 2017-06-05 DIAGNOSIS — Z7901 Long term (current) use of anticoagulants: Secondary | ICD-10-CM

## 2017-06-05 DIAGNOSIS — G4733 Obstructive sleep apnea (adult) (pediatric): Secondary | ICD-10-CM | POA: Diagnosis present

## 2017-06-05 DIAGNOSIS — F329 Major depressive disorder, single episode, unspecified: Secondary | ICD-10-CM | POA: Diagnosis present

## 2017-06-05 DIAGNOSIS — M25562 Pain in left knee: Secondary | ICD-10-CM | POA: Diagnosis present

## 2017-06-05 DIAGNOSIS — Z9114 Patient's other noncompliance with medication regimen: Secondary | ICD-10-CM

## 2017-06-05 DIAGNOSIS — Z720 Tobacco use: Secondary | ICD-10-CM | POA: Diagnosis present

## 2017-06-05 DIAGNOSIS — M1712 Unilateral primary osteoarthritis, left knee: Secondary | ICD-10-CM | POA: Diagnosis present

## 2017-06-05 DIAGNOSIS — T380X5A Adverse effect of glucocorticoids and synthetic analogues, initial encounter: Secondary | ICD-10-CM | POA: Diagnosis present

## 2017-06-05 DIAGNOSIS — E876 Hypokalemia: Secondary | ICD-10-CM | POA: Diagnosis present

## 2017-06-05 DIAGNOSIS — E869 Volume depletion, unspecified: Secondary | ICD-10-CM | POA: Diagnosis present

## 2017-06-05 DIAGNOSIS — J4542 Moderate persistent asthma with status asthmaticus: Secondary | ICD-10-CM

## 2017-06-05 DIAGNOSIS — F17210 Nicotine dependence, cigarettes, uncomplicated: Secondary | ICD-10-CM | POA: Diagnosis present

## 2017-06-05 DIAGNOSIS — Z79899 Other long term (current) drug therapy: Secondary | ICD-10-CM

## 2017-06-05 DIAGNOSIS — Z7952 Long term (current) use of systemic steroids: Secondary | ICD-10-CM

## 2017-06-05 DIAGNOSIS — Z825 Family history of asthma and other chronic lower respiratory diseases: Secondary | ICD-10-CM

## 2017-06-05 DIAGNOSIS — J45901 Unspecified asthma with (acute) exacerbation: Secondary | ICD-10-CM | POA: Diagnosis present

## 2017-06-05 DIAGNOSIS — Z6841 Body Mass Index (BMI) 40.0 and over, adult: Secondary | ICD-10-CM

## 2017-06-05 DIAGNOSIS — J449 Chronic obstructive pulmonary disease, unspecified: Secondary | ICD-10-CM

## 2017-06-05 DIAGNOSIS — K219 Gastro-esophageal reflux disease without esophagitis: Secondary | ICD-10-CM | POA: Diagnosis present

## 2017-06-05 DIAGNOSIS — M25561 Pain in right knee: Secondary | ICD-10-CM | POA: Diagnosis present

## 2017-06-05 DIAGNOSIS — Z7951 Long term (current) use of inhaled steroids: Secondary | ICD-10-CM

## 2017-06-05 DIAGNOSIS — R739 Hyperglycemia, unspecified: Secondary | ICD-10-CM | POA: Diagnosis present

## 2017-06-05 DIAGNOSIS — T50905A Adverse effect of unspecified drugs, medicaments and biological substances, initial encounter: Secondary | ICD-10-CM

## 2017-06-05 DIAGNOSIS — I5189 Other ill-defined heart diseases: Secondary | ICD-10-CM | POA: Diagnosis present

## 2017-06-05 LAB — BASIC METABOLIC PANEL
ANION GAP: 11 (ref 5–15)
BUN: 9 mg/dL (ref 6–20)
CALCIUM: 7.9 mg/dL — AB (ref 8.9–10.3)
CO2: 25 mmol/L (ref 22–32)
Chloride: 101 mmol/L (ref 101–111)
Creatinine, Ser: 0.78 mg/dL (ref 0.44–1.00)
Glucose, Bld: 204 mg/dL — ABNORMAL HIGH (ref 65–99)
Potassium: 3.4 mmol/L — ABNORMAL LOW (ref 3.5–5.1)
Sodium: 137 mmol/L (ref 135–145)

## 2017-06-05 LAB — I-STAT BETA HCG BLOOD, ED (MC, WL, AP ONLY): I-stat hCG, quantitative: <5 m[IU]/mL (ref <5)

## 2017-06-05 LAB — CBC
HCT: 37.4 % (ref 36.0–46.0)
HEMOGLOBIN: 11.9 g/dL — AB (ref 12.0–15.0)
MCH: 27.2 pg (ref 26.0–34.0)
MCHC: 31.8 g/dL (ref 30.0–36.0)
MCV: 85.4 fL (ref 78.0–100.0)
Platelets: 315 10*3/uL (ref 150–400)
RBC: 4.38 MIL/uL (ref 3.87–5.11)
RDW: 17.5 % — ABNORMAL HIGH (ref 11.5–15.5)
WBC: 20.6 10*3/uL — AB (ref 4.0–10.5)

## 2017-06-05 LAB — I-STAT TROPONIN, ED: TROPONIN I, POC: 0 ng/mL (ref 0.00–0.08)

## 2017-06-05 MED ORDER — HYDROCODONE-ACETAMINOPHEN 5-325 MG PO TABS
2.0000 | ORAL_TABLET | Freq: Once | ORAL | Status: AC
  Administered 2017-06-06: 2 via ORAL
  Filled 2017-06-05: qty 2

## 2017-06-05 MED ORDER — ALBUTEROL SULFATE (2.5 MG/3ML) 0.083% IN NEBU
5.0000 mg | INHALATION_SOLUTION | Freq: Once | RESPIRATORY_TRACT | Status: AC
  Administered 2017-06-06: 5 mg via RESPIRATORY_TRACT
  Filled 2017-06-05: qty 6

## 2017-06-05 NOTE — ED Notes (Signed)
Patient transported to X-ray 

## 2017-06-05 NOTE — ED Triage Notes (Signed)
Patient also given 125 solumedrol by EMS

## 2017-06-05 NOTE — ED Triage Notes (Signed)
Patient is from home with chest pain and short of breath.  Stayed in hospital last week and was cardioverted on 05-30-17.  HX of COPD and CHF.  5mg  albuterol, 3 Nitro, 324 ASA. And 8 of morphine.  Last BP 110/60.  Still short of breath.

## 2017-06-05 NOTE — ED Provider Notes (Signed)
[facility] Provider Note   CSN: [id number] Arrival date & time: 06/05/17  2310     History   Chief Complaint Chief Complaint  Patient presents with  . Chest Pain  . Shortness of Breath    HPI April [name] is a 47 y.o. female.  The history is provided by the patient.  Shortness of Breath  This is a new problem. The average episode lasts 1 day. The problem occurs frequently.The current episode started 12 to 24 hours ago. The problem has been gradually worsening. Associated symptoms include headaches, cough, wheezing, chest pain and leg pain. Pertinent negatives include no fever, no sore throat and no vomiting. Treatments tried: albuterol. Associated medical issues include asthma.   Patient with history of obesity, asthma presents with shortness of breath.  She reports this episode started yesterday, with cough/wheezing/shortness of breath.  She also reports chest tightness.  She also reports bilateral leg pain, similar to prior episodes of arthritis.  No fevers, but she does report chills. Denies smoking.  She was recently admitted for asthma, as well as atrial fibrillation.  She is no longer taking her anticoagulants. Past Medical History:  Diagnosis Date  . Acanthosis nigricans   . Anxiety   . Arthritis    "knees" (04/28/2017)  . Asthma    Followed by Dr. Annamaria Boots (pulmonology); receives every other week omalizumab injections; has frequent exacerbations  . COPD (chronic obstructive pulmonary disease) ([place])    PFTs in 2002, FEV1/FVC 65, no post bronchodilater test done  . Depression   . GERD (gastroesophageal reflux disease)   . GERD (gastroesophageal reflux disease)   . Headache(784.0)    "q couple days" (04/28/2017)  . Helicobacter pylori (H. pylori) infection   . Hypertension, essential   . Insomnia   . Menorrhagia   . Morbid obesity ([place])   . Obesity   . OSA on CPAP    Sleep study 2008 - mild OSA, not enough events to titrate  CPAP; wears CPAP now/pt on 04/28/2017  . Pneumonia X 1  . Seasonal allergies   . Shortness of breath   . Tobacco user     Patient Active Problem List   Diagnosis Date Noted  . Persistent atrial fibrillation ([place])   . Acute on chronic respiratory failure with hypoxia ([place]) 05/26/2017  . COPD exacerbation ([place]) 05/26/2017  . Acute asthma exacerbation 02/15/2017  . COPD with acute exacerbation ([place]) 02/10/2017  . Dyspnea 02/04/2017  . Asthma 02/04/2017  . Hypertension, essential 02/04/2017  . Sleep apnea 02/04/2017  . Morbid obesity with BMI of 50.0-59.9, adult ([place]) 02/04/2017  . Chronic diastolic heart failure, NYHA class 1 ([place]) 02/04/2017  . Acute hyperglycemia 02/04/2017  . Chronic diastolic CHF (congestive heart failure) ([place]) 01/17/2017  . SIRS (systemic inflammatory response syndrome) ([place]) 01/17/2017  . Depression 01/17/2017  . OSA (obstructive sleep apnea) 01/17/2017  . Asthma exacerbation 01/17/2017  . SOB (shortness of breath)   . Asthma exacerbation in COPD ([place]) 12/10/2016  . Moderate persistent asthma with exacerbation 10/19/2016  . Diarrhea 10/19/2016  . Normocytic anemia 10/02/2016  . Respiratory failure ([place]) 10/02/2016  . Hypertensive urgency, malignant 06/22/2016  . Pulmonary edema 06/22/2016  . Hypertensive urgency 06/22/2016  . Malignant hypertension due to primary aldosteronism ([place]) 05/05/2016  . Lip laceration 05/05/2016  . Elevated hemoglobin A1c 05/05/2016  . GERD (gastroesophageal reflux disease) 08/30/2015  . Anxiety and depression 08/30/2015  . Prediabetes 08/22/2015  . Seasonal allergic rhinitis 08/29/2013  .  Leukocytosis 10/21/2012  . Tobacco abuse 10/07/2012  . Asthma, chronic obstructive, without status asthmaticus ([place]) 05/07/2012  . Knee pain, bilateral 04/25/2011  . Primary insomnia 03/14/2011  . Mild obstructive sleep apnea 12/19/2010  . Hypokalemia 08/13/2010  . Cervical back pain with evidence of disc disease 04/08/2008  .  Essential hypertension 07/31/2006  . Morbid obesity with body mass index of 50.0-59.9 in adult ([place]) 06/17/2006  . Major depressive disorder, recurrent episode ([place]) 04/10/2006    Past Surgical History:  Procedure Laterality Date  . CARDIOVERSION N/A 05/30/2017   Procedure: CARDIOVERSION;  Surgeon: Sanda Klein, MD;  Location: [facility];  Service: Cardiovascular;  Laterality: N/A;  . REDUCTION MAMMAPLASTY Bilateral 09/2011  . TUBAL LIGATION  1996   bilateral    OB History    No data available       Home Medications    Prior to Admission medications   Medication Sig Start Date End Date Taking? Authorizing Provider  albuterol (PROVENTIL HFA;VENTOLIN HFA) 108 (90 Base) MCG/ACT inhaler Inhale 2 puffs every 4 (four) hours as needed into the lungs for wheezing or shortness of breath. 03/12/17   [name], MD  apixaban (ELIQUIS) 5 MG TABS tablet Take 1 tablet (5 mg total) by mouth 2 (two) times daily. 06/01/17   Regalado, Belkys A, MD  arformoterol (BROVANA) 15 MCG/2ML NEBU Take 2 mLs (15 mcg total) 2 (two) times daily by nebulization. 03/12/17   [name], MD  budesonide (PULMICORT) 0.25 MG/2ML nebulizer solution Take 2 mLs (0.25 mg total) 2 (two) times daily by nebulization. 03/12/17   [name], MD  busPIRone (BUSPAR) 7.5 MG tablet Take 1 tablet (7.5 mg total) 2 (two) times daily by mouth. 03/12/17   [name], MD  butalbital-acetaminophen-caffeine (FIORICET, ESGIC) 50-325-40 MG tablet Take 1-2 tablets by mouth every 6 (six) hours as needed for headache. 12/19/16 12/19/17  Joy, Shawn C, PA-C  diltiazem (CARDIZEM CD) 240 MG 24 hr capsule Take 1 capsule (240 mg total) by mouth daily. 06/02/17   Regalado, Belkys A, MD  famotidine (PEPCID) 20 MG tablet Take 1 tablet (20 mg total) by mouth 2 (two) times daily. 03/29/17   Ward, Ozella Almond, PA-C  fluticasone (FLONASE) 50 MCG/ACT nasal spray Place 2 sprays into both nostrils daily. 12/13/16   Rai, Vernelle Emerald, MD    furosemide (LASIX) 40 MG tablet Take 1 tablet (40 mg total) daily by mouth. 03/12/17 03/12/18  [name], MD  guaiFENesin (MUCINEX) 600 MG 12 hr tablet Take 1 tablet (600 mg total) by mouth 2 (two) times daily. 06/01/17   Regalado, Belkys A, MD  levalbuterol (XOPENEX) 0.63 MG/3ML nebulizer solution Take 3 mLs (0.63 mg total) by nebulization every 6 (six) hours. 06/01/17   Regalado, Belkys A, MD  loratadine (CLARITIN) 10 MG tablet Take 1 tablet (10 mg total) by mouth daily. 08/30/15   [name], MD  losartan (COZAAR) 100 MG tablet Take 1 tablet (100 mg total) daily by mouth. 03/12/17   [name], MD  metoprolol tartrate (LOPRESSOR) 100 MG tablet Take 1 tablet (100 mg total) by mouth 2 (two) times daily. 02/05/17   [name], MD  montelukast (SINGULAIR) 10 MG tablet Take 1 tablet (10 mg total) at bedtime by mouth. 03/12/17   [name], MD  nicotine (NICODERM CQ - DOSED IN MG/24 HOURS) 14 mg/24hr patch Place 1 patch (14 mg total) onto the skin daily. 10/04/16   Reyne Dumas, MD  omeprazole (PRILOSEC) 20 MG capsule Take 1 capsule (20 mg  total) by mouth 2 (two) times daily. 04/30/17   Geradine Girt, DO  predniSONE (DELTASONE) 20 MG tablet Take 3 tablet by mouth for 5  days then 2 tablets for 3 days then go back to 1 tablet daily 06/01/17   Regalado, Belkys A, MD  sertraline (ZOLOFT) 100 MG tablet Take 1 tablet (100 mg total) daily by mouth. 03/12/17   [name], MD  spironolactone (ALDACTONE) 100 MG tablet Take 1 tablet (100 mg total) daily by mouth. 03/12/17   [name], MD  Vitamin D, Ergocalciferol, (DRISDOL) 50000 units CAPS capsule Take 1 capsule (50,000 Units total) by mouth every 7 (seven) days. 06/07/17   Regalado, Belkys A, MD  zolpidem (AMBIEN) 10 MG tablet Take 10 mg by mouth at bedtime. 09/27/10   [provider]    Family History Family History  Problem Relation Age of Onset  . Hypertension Mother   . Asthma Daughter   . Cancer Paternal Aunt   .  Asthma Maternal Grandmother     Social History Social History   Tobacco Use  . Smoking status: Former Smoker    Packs/day: 0.50    Years: 26.00    Pack years: 13.00    Types: Cigarettes    Last attempt to quit: 09/12/2014    Years since quitting: 2.7  . Smokeless tobacco: Never Used  Substance Use Topics  . Alcohol use: No  . Drug use: No     Allergies   Patient has no known allergies.   Review of Systems Review of Systems  Constitutional: Positive for chills. Negative for fever.  HENT: Negative for sore throat.   Respiratory: Positive for cough, shortness of breath and wheezing.   Cardiovascular: Positive for chest pain.  Gastrointestinal: Negative for vomiting.  Neurological: Positive for headaches.  All other systems reviewed and are negative.    Physical Exam Updated Vital Signs Temp 98.9 F (37.2 C) (Temporal)   Ht 1.676 m (5\' 6" )   Wt (!) 152.4 kg (336 lb)   BMI 54.23 kg/m   Physical Exam CONSTITUTIONAL: Chronically ill-appearing HEAD: Normocephalic/atraumatic EYES: EOMI/PERRL ENMT: Mucous membranes moist NECK: supple no meningeal signs SPINE/BACK:entire spine nontender CV: S1/S2 noted, tachycardic LUNGS: Tachypnea noted, wheezing bilaterally, no distress noted ABDOMEN: soft, nontender, no rebound or guarding, bowel sounds noted throughout abdomen GU:no cva tenderness NEURO: Pt is awake/alert/appropriate, moves all extremitiesx4.  No facial droop.   EXTREMITIES: pulses normal/equal, full ROM, no calf tenderness or erythema.  Distal pulses intact.  No joint effusion noted SKIN: warm, color normal PSYCH: no abnormalities of mood noted, alert and oriented to situation   ED Treatments / Results  Labs (all labs ordered are listed, but only abnormal results are displayed) Labs Reviewed  BASIC METABOLIC PANEL - Abnormal; Notable for the following components:      Result Value   Potassium 3.4 (*)    Glucose, Bld 204 (*)    Calcium 7.9 (*)    All  other components within normal limits  CBC - Abnormal; Notable for the following components:   WBC 20.6 (*)    Hemoglobin 11.9 (*)    RDW 17.5 (*)    All other components within normal limits  INFLUENZA PANEL BY PCR (TYPE A & B)  I-STAT TROPONIN, ED  I-STAT BETA HCG BLOOD, ED ([facility], [facility], [facility] ONLY)    EKG  EKG Interpretation  Date/Time:  Thursday June 05 2017 23:16:07 EST Ventricular Rate:  109 PR Interval:    QRS Duration:  81 QT Interval:  345 QTC Calculation: 465 R Axis:   17 Text Interpretation:  Sinus tachycardia Ventricular premature complex Right atrial enlargement Borderline repolarization abnormality Confirmed by Ripley Fraise [phone]) on 06/05/2017 11:29:20 PM       Radiology Dg Chest 2 View  Result Date: 06/06/2017 CLINICAL DATA:  Generalized chest pain and shortness of breath. EXAM: CHEST  2 VIEW COMPARISON:  May 26, 2017 FINDINGS: The heart size and mediastinal contours are within normal limits. Both lungs are clear. The visualized skeletal structures are unremarkable. IMPRESSION: No active cardiopulmonary disease. Electronically Signed   By: Dorise Bullion III M.D   On: 06/06/2017 00:01    Procedures Procedures  CRITICAL CARE Performed by: Sharyon Cable Total critical care time: 33 minutes Critical care time was exclusive of separately billable procedures and treating other patients. Critical care was necessary to treat or prevent imminent or life-threatening deterioration. Critical care was time spent personally by me on the following activities: development of treatment plan with patient and/or surrogate as well as nursing, discussions with consultants, evaluation of patient's response to treatment, examination of patient, obtaining history from patient or surrogate, ordering and performing treatments and interventions, ordering and review of laboratory studies, ordering and review of radiographic studies, pulse oximetry and re-evaluation of patient's  condition. Patient with status asthmaticus that is not resolved with an hour-long nebulized treatment, will need to be admitted. Medications Ordered in ED Medications  albuterol (PROVENTIL) (2.5 MG/3ML) 0.083% nebulizer solution 5 mg (5 mg Nebulization Given 06/06/17 0013)  HYDROcodone-acetaminophen (NORCO/VICODIN) 5-325 MG per tablet 2 tablet (2 tablets Oral Given 06/06/17 0059)  albuterol (PROVENTIL,VENTOLIN) solution continuous neb (10 mg/hr Nebulization Given 06/06/17 0119)  albuterol (PROVENTIL) (2.5 MG/3ML) 0.083% nebulizer solution 5 mg (5 mg Nebulization Given 06/06/17 0439)     Initial Impression / Assessment and Plan / ED Course  I have reviewed the triage vital signs and the nursing notes.  Pertinent labs & imaging results that were available during my care of the patient were reviewed by me and considered in my medical decision making (see chart for details).    11:33 PM Patient presents with recurrent asthma exacerbation.  She reports is similar to prior episodes.  She has mild tachypnea but is otherwise in no distress.  We will treat with nebs and reassess. 6:42 AM Patient monitored several hours in the emergency department.  She was given multiple nebulized treatments including an hour-long nebulized treatment.  She ambulated and became short of breath.  She is still wheezing bilaterally.  She is high risk for worsening, therefore she would need to be admitted to the hospital This was discussed with patient she is agreeable with plan Final Clinical Impressions(s) / ED Diagnoses   Final diagnoses:  Moderate persistent asthma with status asthmaticus    ED Discharge Orders    None       Ripley Fraise, MD 06/06/17 [phone]

## 2017-06-06 ENCOUNTER — Encounter (HOSPITAL_COMMUNITY): Payer: Self-pay | Admitting: General Practice

## 2017-06-06 ENCOUNTER — Other Ambulatory Visit: Payer: Self-pay

## 2017-06-06 ENCOUNTER — Inpatient Hospital Stay (HOSPITAL_COMMUNITY)
Admit: 2017-06-06 | Discharge: 2017-06-06 | Disposition: A | Discharge status: Home / Self Care | Payer: Medicaid Other | Attending: Internal Medicine | Admitting: Internal Medicine

## 2017-06-06 DIAGNOSIS — K219 Gastro-esophageal reflux disease without esophagitis: Secondary | ICD-10-CM | POA: Diagnosis present

## 2017-06-06 DIAGNOSIS — Z9114 Patient's other noncompliance with medication regimen: Secondary | ICD-10-CM | POA: Diagnosis not present

## 2017-06-06 DIAGNOSIS — E869 Volume depletion, unspecified: Secondary | ICD-10-CM | POA: Diagnosis present

## 2017-06-06 DIAGNOSIS — M79609 Pain in unspecified limb: Secondary | ICD-10-CM

## 2017-06-06 DIAGNOSIS — I10 Essential (primary) hypertension: Secondary | ICD-10-CM | POA: Diagnosis present

## 2017-06-06 DIAGNOSIS — J4542 Moderate persistent asthma with status asthmaticus: Secondary | ICD-10-CM | POA: Diagnosis present

## 2017-06-06 DIAGNOSIS — G4733 Obstructive sleep apnea (adult) (pediatric): Secondary | ICD-10-CM | POA: Diagnosis present

## 2017-06-06 DIAGNOSIS — Z720 Tobacco use: Secondary | ICD-10-CM | POA: Diagnosis not present

## 2017-06-06 DIAGNOSIS — Z825 Family history of asthma and other chronic lower respiratory diseases: Secondary | ICD-10-CM | POA: Diagnosis not present

## 2017-06-06 DIAGNOSIS — M25561 Pain in right knee: Secondary | ICD-10-CM | POA: Diagnosis present

## 2017-06-06 DIAGNOSIS — R739 Hyperglycemia, unspecified: Secondary | ICD-10-CM

## 2017-06-06 DIAGNOSIS — M1712 Unilateral primary osteoarthritis, left knee: Secondary | ICD-10-CM | POA: Diagnosis present

## 2017-06-06 DIAGNOSIS — T380X5A Adverse effect of glucocorticoids and synthetic analogues, initial encounter: Secondary | ICD-10-CM | POA: Diagnosis present

## 2017-06-06 DIAGNOSIS — M25562 Pain in left knee: Secondary | ICD-10-CM | POA: Diagnosis present

## 2017-06-06 DIAGNOSIS — J441 Chronic obstructive pulmonary disease with (acute) exacerbation: Secondary | ICD-10-CM | POA: Diagnosis present

## 2017-06-06 DIAGNOSIS — Z7951 Long term (current) use of inhaled steroids: Secondary | ICD-10-CM | POA: Diagnosis not present

## 2017-06-06 DIAGNOSIS — I5189 Other ill-defined heart diseases: Secondary | ICD-10-CM | POA: Diagnosis present

## 2017-06-06 DIAGNOSIS — Z79899 Other long term (current) drug therapy: Secondary | ICD-10-CM | POA: Diagnosis not present

## 2017-06-06 DIAGNOSIS — Z7952 Long term (current) use of systemic steroids: Secondary | ICD-10-CM | POA: Diagnosis not present

## 2017-06-06 DIAGNOSIS — Z6841 Body Mass Index (BMI) 40.0 and over, adult: Secondary | ICD-10-CM

## 2017-06-06 DIAGNOSIS — J4551 Severe persistent asthma with (acute) exacerbation: Secondary | ICD-10-CM

## 2017-06-06 DIAGNOSIS — F329 Major depressive disorder, single episode, unspecified: Secondary | ICD-10-CM | POA: Diagnosis present

## 2017-06-06 DIAGNOSIS — Z7901 Long term (current) use of anticoagulants: Secondary | ICD-10-CM | POA: Diagnosis not present

## 2017-06-06 DIAGNOSIS — F17210 Nicotine dependence, cigarettes, uncomplicated: Secondary | ICD-10-CM | POA: Diagnosis present

## 2017-06-06 DIAGNOSIS — I4891 Unspecified atrial fibrillation: Secondary | ICD-10-CM | POA: Diagnosis present

## 2017-06-06 DIAGNOSIS — T50905A Adverse effect of unspecified drugs, medicaments and biological substances, initial encounter: Secondary | ICD-10-CM

## 2017-06-06 DIAGNOSIS — E876 Hypokalemia: Secondary | ICD-10-CM

## 2017-06-06 LAB — CBG MONITORING, ED
GLUCOSE-CAPILLARY: 214 mg/dL — AB (ref 65–99)
Glucose-Capillary: 275 mg/dL — ABNORMAL HIGH (ref 65–99)

## 2017-06-06 LAB — GLUCOSE, CAPILLARY: Glucose-Capillary: 164 mg/dL — ABNORMAL HIGH (ref 65–99)

## 2017-06-06 LAB — INFLUENZA PANEL BY PCR (TYPE A & B)
INFLAPCR: NEGATIVE
Influenza B By PCR: NEGATIVE

## 2017-06-06 MED ORDER — ALBUTEROL (5 MG/ML) CONTINUOUS INHALATION SOLN
10.0000 mg/h | INHALATION_SOLUTION | Freq: Once | RESPIRATORY_TRACT | Status: AC
  Administered 2017-06-06: 10 mg/h via RESPIRATORY_TRACT
  Filled 2017-06-06: qty 20

## 2017-06-06 MED ORDER — SERTRALINE HCL 100 MG PO TABS
100.0000 mg | ORAL_TABLET | Freq: Every day | ORAL | Status: DC
  Administered 2017-06-06 – 2017-06-08 (×3): 100 mg via ORAL
  Filled 2017-06-06 (×4): qty 1

## 2017-06-06 MED ORDER — SODIUM CHLORIDE 0.9 % IV SOLN
INTRAVENOUS | Status: AC
  Administered 2017-06-06 – 2017-06-07 (×3): via INTRAVENOUS

## 2017-06-06 MED ORDER — FAMOTIDINE 20 MG PO TABS
20.0000 mg | ORAL_TABLET | Freq: Two times a day (BID) | ORAL | Status: DC
  Administered 2017-06-06 – 2017-06-07 (×3): 20 mg via ORAL
  Filled 2017-06-06 (×3): qty 1

## 2017-06-06 MED ORDER — LEVALBUTEROL HCL 0.63 MG/3ML IN NEBU
0.6300 mg | INHALATION_SOLUTION | Freq: Four times a day (QID) | RESPIRATORY_TRACT | Status: DC
  Administered 2017-06-06 – 2017-06-08 (×10): 0.63 mg via RESPIRATORY_TRACT
  Filled 2017-06-06 (×10): qty 3

## 2017-06-06 MED ORDER — IPRATROPIUM BROMIDE 0.02 % IN SOLN
0.5000 mg | Freq: Four times a day (QID) | RESPIRATORY_TRACT | Status: DC
  Administered 2017-06-06 – 2017-06-08 (×10): 0.5 mg via RESPIRATORY_TRACT
  Filled 2017-06-06 (×10): qty 2.5

## 2017-06-06 MED ORDER — ALBUTEROL SULFATE (2.5 MG/3ML) 0.083% IN NEBU
5.0000 mg | INHALATION_SOLUTION | Freq: Once | RESPIRATORY_TRACT | Status: AC
  Administered 2017-06-06: 5 mg via RESPIRATORY_TRACT
  Filled 2017-06-06: qty 6

## 2017-06-06 MED ORDER — ACETAMINOPHEN 650 MG RE SUPP: 650.0000 mg | Freq: Four times a day (QID) | RECTAL | Status: DC | PRN

## 2017-06-06 MED ORDER — ACETAMINOPHEN 325 MG PO TABS
650.0000 mg | ORAL_TABLET | Freq: Four times a day (QID) | ORAL | Status: DC | PRN
  Administered 2017-06-06 – 2017-06-07 (×3): 650 mg via ORAL
  Filled 2017-06-06 (×3): qty 2

## 2017-06-06 MED ORDER — ONDANSETRON HCL 4 MG PO TABS: 4.0000 mg | ORAL_TABLET | Freq: Four times a day (QID) | ORAL | Status: DC | PRN

## 2017-06-06 MED ORDER — METHYLPREDNISOLONE SODIUM SUCC 40 MG IJ SOLR
40.0000 mg | Freq: Four times a day (QID) | INTRAMUSCULAR | Status: DC
  Administered 2017-06-06 – 2017-06-08 (×9): 40 mg via INTRAVENOUS
  Filled 2017-06-06 (×9): qty 1

## 2017-06-06 MED ORDER — BUTALBITAL-APAP-CAFFEINE 50-325-40 MG PO TABS: 1.0000 | ORAL_TABLET | Freq: Four times a day (QID) | ORAL | Status: DC | PRN

## 2017-06-06 MED ORDER — KETOROLAC TROMETHAMINE 30 MG/ML IJ SOLN
30.0000 mg | Freq: Once | INTRAMUSCULAR | Status: AC
  Administered 2017-06-06: 30 mg via INTRAVENOUS
  Filled 2017-06-06: qty 1

## 2017-06-06 MED ORDER — BUSPIRONE HCL 15 MG PO TABS
7.5000 mg | ORAL_TABLET | Freq: Two times a day (BID) | ORAL | Status: DC
  Administered 2017-06-06 – 2017-06-08 (×4): 7.5 mg via ORAL
  Filled 2017-06-06 (×6): qty 1

## 2017-06-06 MED ORDER — MONTELUKAST SODIUM 10 MG PO TABS
10.0000 mg | ORAL_TABLET | Freq: Every day | ORAL | Status: DC
  Administered 2017-06-06 – 2017-06-07 (×2): 10 mg via ORAL
  Filled 2017-06-06 (×3): qty 1

## 2017-06-06 MED ORDER — ZOLPIDEM TARTRATE 5 MG PO TABS
5.0000 mg | ORAL_TABLET | Freq: Once | ORAL | Status: AC
  Administered 2017-06-06: 5 mg via ORAL
  Filled 2017-06-06: qty 1

## 2017-06-06 MED ORDER — NICOTINE 14 MG/24HR TD PT24
14.0000 mg | MEDICATED_PATCH | Freq: Every day | TRANSDERMAL | Status: DC
  Administered 2017-06-06 – 2017-06-08 (×3): 14 mg via TRANSDERMAL
  Filled 2017-06-06 (×3): qty 1

## 2017-06-06 MED ORDER — METHYLPREDNISOLONE SODIUM SUCC 125 MG IJ SOLR
60.0000 mg | Freq: Four times a day (QID) | INTRAMUSCULAR | Status: DC
  Administered 2017-06-06: 60 mg via INTRAVENOUS
  Filled 2017-06-06: qty 2

## 2017-06-06 MED ORDER — LEVALBUTEROL HCL 1.25 MG/0.5ML IN NEBU: 1.2500 mg | INHALATION_SOLUTION | Freq: Four times a day (QID) | RESPIRATORY_TRACT | Status: DC

## 2017-06-06 MED ORDER — ALBUTEROL SULFATE (2.5 MG/3ML) 0.083% IN NEBU
2.5000 mg | INHALATION_SOLUTION | RESPIRATORY_TRACT | Status: DC | PRN
  Administered 2017-06-06 – 2017-06-07 (×2): 2.5 mg via RESPIRATORY_TRACT
  Filled 2017-06-06 (×2): qty 3

## 2017-06-06 MED ORDER — SPIRONOLACTONE 50 MG PO TABS
100.0000 mg | ORAL_TABLET | Freq: Every day | ORAL | Status: DC
  Administered 2017-06-06 – 2017-06-08 (×3): 100 mg via ORAL
  Filled 2017-06-06: qty 1
  Filled 2017-06-06 (×2): qty 2
  Filled 2017-06-06: qty 1

## 2017-06-06 MED ORDER — PANTOPRAZOLE SODIUM 40 MG PO TBEC
40.0000 mg | DELAYED_RELEASE_TABLET | Freq: Every day | ORAL | Status: DC
  Administered 2017-06-06 – 2017-06-08 (×3): 40 mg via ORAL
  Filled 2017-06-06 (×3): qty 1

## 2017-06-06 MED ORDER — ONDANSETRON HCL 4 MG/2ML IJ SOLN: 4.0000 mg | Freq: Four times a day (QID) | INTRAMUSCULAR | Status: DC | PRN

## 2017-06-06 MED ORDER — METOPROLOL TARTRATE 100 MG PO TABS
100.0000 mg | ORAL_TABLET | Freq: Two times a day (BID) | ORAL | Status: DC
  Administered 2017-06-06 – 2017-06-08 (×5): 100 mg via ORAL
  Filled 2017-06-06 (×4): qty 1
  Filled 2017-06-06: qty 4

## 2017-06-06 MED ORDER — IBUPROFEN 400 MG PO TABS: 600.0000 mg | ORAL_TABLET | Freq: Four times a day (QID) | ORAL | Status: DC | PRN

## 2017-06-06 MED ORDER — INSULIN ASPART 100 UNIT/ML ~~LOC~~ SOLN
0.0000 [IU] | Freq: Three times a day (TID) | SUBCUTANEOUS | Status: DC
  Administered 2017-06-06: 8 [IU] via SUBCUTANEOUS
  Administered 2017-06-06: 5 [IU] via SUBCUTANEOUS
  Administered 2017-06-07: 8 [IU] via SUBCUTANEOUS
  Administered 2017-06-07 (×2): 5 [IU] via SUBCUTANEOUS
  Administered 2017-06-08: 11 [IU] via SUBCUTANEOUS
  Administered 2017-06-08: 8 [IU] via SUBCUTANEOUS
  Filled 2017-06-06: qty 1

## 2017-06-06 MED ORDER — SODIUM CHLORIDE 0.9 % IV BOLUS (SEPSIS)
1000.0000 mL | Freq: Once | INTRAVENOUS | Status: AC
  Administered 2017-06-06: 1000 mL via INTRAVENOUS

## 2017-06-06 MED ORDER — POLYETHYLENE GLYCOL 3350 17 G PO PACK: 17.0000 g | PACK | Freq: Every day | ORAL | Status: DC | PRN

## 2017-06-06 MED ORDER — MAGNESIUM SULFATE 2 GM/50ML IV SOLN
2.0000 g | Freq: Once | INTRAVENOUS | Status: AC
  Administered 2017-06-06: 2 g via INTRAVENOUS
  Filled 2017-06-06: qty 50

## 2017-06-06 MED ORDER — APIXABAN 5 MG PO TABS
5.0000 mg | ORAL_TABLET | Freq: Two times a day (BID) | ORAL | Status: DC
  Administered 2017-06-06 – 2017-06-08 (×5): 5 mg via ORAL
  Filled 2017-06-06 (×6): qty 1

## 2017-06-06 NOTE — H&P (Signed)
History and Physical:    April [name]   XKG:[id number] DOB: 01-23-1971 DOA: 06/05/2017  Referring MD/provider: Dr Myna Hidalgo PCP: [name], MD   Patient coming from: Home  Chief Complaint: Left knee pain and worsening shortness of breath  History of Present Illness:   April [name] is an 46 y.o. female with past medical history significant for asthma, obesity, obstructive sleep apnea, atrial fibrillation who was recently discharged from [facility] 4 days ago after treatment for asthma. Patient agrees that she was supposed to stay an extra day but wanted to go home. Patient states she did okay for 1-2 days after discharge however 2 days prior to admission had sudden onset left knee pain. She notes is the worst pain that she's ever had much worse than her usual left knee arthritis pain. Patient notes it radiates downwards and has prevented her from walking. Patient denies that it feels like a sharp shock shooting down her leg. Patient denies back pain. Patient notes that is an aching throbbing pain that is not positional although it does get worse with weightbearing.  Patient also notes worsening of her respiratory status over the past couple of days. Patient admits that she is not compliant with her medications however states she was taking her inhaled bronchodilators as well as her prednisone. She admits she was not taking her heart medications.  Patient denies fevers or chills admits to him and sweats times many years without change. She admits to rare cough which is also without change. Patient notes that she continues to smoke tobacco although has cut down on the past week.  Patient does admit to increased thirst lately as well as increased urination. Denies any weight loss. Admits to fatigue but notes that's not different from baseline.  ED Course:  The patient was treated with steroids, inhaled bronchodilators and magnesium.  ROS:   ROS   No hemoptysis, no chest pain, no  palpitations, no nausea vomiting or diarrhea or abdominal pain. Patient denies any back pain. No numbness, tingling. No dizziness or syncope or presyncope.    Past Medical History:   Past Medical History:  Diagnosis Date  . Acanthosis nigricans   . Anxiety   . Arthritis    "knees" (04/28/2017)  . Asthma    Followed by Dr. Annamaria Boots (pulmonology); receives every other week omalizumab injections; has frequent exacerbations  . COPD (chronic obstructive pulmonary disease) ([place])    PFTs in 2002, FEV1/FVC 65, no post bronchodilater test done  . Depression   . GERD (gastroesophageal reflux disease)   . GERD (gastroesophageal reflux disease)   . Headache(784.0)    "q couple days" (04/28/2017)  . Helicobacter pylori (H. pylori) infection   . Hypertension, essential   . Insomnia   . Menorrhagia   . Morbid obesity ([place])   . Obesity   . OSA on CPAP    Sleep study 2008 - mild OSA, not enough events to titrate CPAP; wears CPAP now/pt on 04/28/2017  . Pneumonia X 1  . Seasonal allergies   . Shortness of breath   . Tobacco user     Past Surgical History:   Past Surgical History:  Procedure Laterality Date  . CARDIOVERSION N/A 05/30/2017   Procedure: CARDIOVERSION;  Surgeon: Sanda Klein, MD;  Location: [facility];  Service: Cardiovascular;  Laterality: N/A;  . REDUCTION MAMMAPLASTY Bilateral 09/2011  . TUBAL LIGATION  1996   bilateral    Social History:   Social History   Socioeconomic  History  . Marital status: Single    Spouse name: Not on file  . Number of children: 1  . Years of education: Not on file  . Highest education level: Not on file  Social Needs  . Financial resource strain: Not on file  . Food insecurity - worry: Not on file  . Food insecurity - inability: Not on file  . Transportation needs - medical: Not on file  . Transportation needs - non-medical: Not on file  Occupational History  . Occupation: CNA  Tobacco Use  . Smoking status: Former Smoker     Packs/day: 0.50    Years: 26.00    Pack years: 13.00    Types: Cigarettes    Last attempt to quit: 09/12/2014    Years since quitting: 2.7  . Smokeless tobacco: Never Used  Substance and Sexual Activity  . Alcohol use: No  . Drug use: No  . Sexual activity: No  Other Topics Concern  . 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    Allergies   Patient has no known allergies.  Family history:   Family History  Problem Relation Age of Onset  . Hypertension Mother   . Asthma Daughter   . Cancer Paternal Aunt   . Asthma Maternal Grandmother     Current Medications:   Prior to Admission medications   Medication Sig Start Date End Date Taking? Authorizing Provider  apixaban (ELIQUIS) 5 MG TABS tablet Take 1 tablet (5 mg total) by mouth 2 (two) times daily. 06/01/17  Yes Regalado, Belkys A, MD  arformoterol (BROVANA) 15 MCG/2ML NEBU Take 2 mLs (15 mcg total) 2 (two) times daily by nebulization. 03/12/17  Yes Newlin, Enobong, MD  budesonide (PULMICORT) 0.25 MG/2ML nebulizer solution Take 2 mLs (0.25 mg total) 2 (two) times daily by nebulization. 03/12/17  Yes [name], MD  busPIRone (BUSPAR) 7.5 MG tablet Take 1 tablet (7.5 mg total) 2 (two) times daily by mouth. 03/12/17  Yes [name], MD  butalbital-acetaminophen-caffeine (FIORICET, ESGIC) 50-325-40 MG tablet Take 1-2 tablets by mouth every 6 (six) hours as needed for headache. 12/19/16 12/19/17 Yes Joy, Shawn C, PA-C  diltiazem (CARDIZEM CD) 240 MG 24 hr capsule Take 1 capsule (240 mg total) by mouth daily. 06/02/17  Yes Regalado, Belkys A, MD  famotidine (PEPCID) 20 MG tablet Take 1 tablet (20 mg total) by mouth 2 (two) times daily. 03/29/17  Yes Ward, Ozella Almond, PA-C  fluticasone (FLONASE) 50 MCG/ACT nasal spray Place 2 sprays into both nostrils daily. 12/13/16  Yes Rai, Ripudeep K, MD  furosemide (LASIX) 40 MG tablet Take 1 tablet (40 mg total) daily by mouth. 03/12/17  03/12/18 Yes Newlin, Enobong, MD  guaiFENesin (MUCINEX) 600 MG 12 hr tablet Take 1 tablet (600 mg total) by mouth 2 (two) times daily. 06/01/17  Yes Regalado, Belkys A, MD  levalbuterol (XOPENEX) 0.63 MG/3ML nebulizer solution Take 3 mLs (0.63 mg total) by nebulization every 6 (six) hours. 06/01/17  Yes Regalado, Belkys A, MD  loratadine (CLARITIN) 10 MG tablet Take 1 tablet (10 mg total) by mouth daily. 08/30/15  Yes [name], MD  losartan (COZAAR) 100 MG tablet Take 1 tablet (100 mg total) daily by mouth. 03/12/17  Yes Newlin, Enobong, MD  metoprolol tartrate (LOPRESSOR) 100 MG tablet Take 1 tablet (100 mg total) by mouth 2 (two) times daily. 02/05/17  Yes Newlin, Charlane Ferretti, MD  montelukast (SINGULAIR) 10 MG tablet Take  1 tablet (10 mg total) at bedtime by mouth. 03/12/17  Yes Newlin, Enobong, MD  nicotine (NICODERM CQ - DOSED IN MG/24 HOURS) 14 mg/24hr patch Place 1 patch (14 mg total) onto the skin daily. 10/04/16  Yes Reyne Dumas, MD  omeprazole (PRILOSEC) 20 MG capsule Take 1 capsule (20 mg total) by mouth 2 (two) times daily. 04/30/17  Yes Geradine Girt, DO  predniSONE (DELTASONE) 20 MG tablet Take 3 tablet by mouth for 5  days then 2 tablets for 3 days then go back to 1 tablet daily 06/01/17  Yes Regalado, Belkys A, MD  sertraline (ZOLOFT) 100 MG tablet Take 1 tablet (100 mg total) daily by mouth. 03/12/17  Yes Newlin, Enobong, MD  spironolactone (ALDACTONE) 100 MG tablet Take 1 tablet (100 mg total) daily by mouth. 03/12/17  Yes Newlin, Enobong, MD  Vitamin D, Ergocalciferol, (DRISDOL) 50000 units CAPS capsule Take 1 capsule (50,000 Units total) by mouth every 7 (seven) days. 06/07/17  Yes Regalado, Belkys A, MD  zolpidem (AMBIEN) 10 MG tablet Take 10 mg by mouth at bedtime. 09/27/10  Yes [provider]  albuterol (PROVENTIL HFA;VENTOLIN HFA) 108 (90 Base) MCG/ACT inhaler Inhale 2 puffs every 4 (four) hours as needed into the lungs for wheezing or shortness of breath. 03/12/17   [name], MD    Physical Exam:   Vitals:   06/06/17 0530 06/06/17 0600 06/06/17 0630 06/06/17 0830  BP: (!) 161/91 (!) 171/96  (!) 157/88  Pulse: (!) 104 (!) 101 99 91  Resp:    (!) 23  Temp:      TempSrc:      SpO2: 97% 97% 97% 98%  Weight:      Height:         Physical Exam: Blood pressure (!) 157/88, pulse 91, temperature 98.9 F (37.2 C), temperature source Temporal, resp. rate (!) 23, height 5\' 6"  (1.676 m), weight (!) 152.4 kg (336 lb), SpO2 98 %. Gen: obese female sitting up in bed with slight tachypnea, mildly labored but able to speak in short sentences without taking a breath. Eyes: Sclerae anicteric. Conjunctiva mildly injected. Neck: Supple Chest:  Decreased air entry bilaterally with rare wheeze on right. No other adventitious sounds  CV: Distant, regular, no audible murmurs. Abdomen:  Obese,NABS, soft, nontender. Extremities:  Patient has focal tenderness on left knee medially extending downwards to ankle. No redness or rash noted. No calf tenderness noted. No tenderness along her joint line. I'm unable to do straight leg raise due to body habitus. Skin: Warm and dry. No rashes, lesions or wounds. Neuro: Alert and oriented times 3; grossly nonfocal. Psych: Patient is cooperative, logical and coherent with appropriate mood and affect.  Data Review:    Labs: Basic Metabolic Panel: Recent Labs  Lab 06/05/17 2320  NA 137  K 3.4*  CL 101  CO2 25  GLUCOSE 204*  BUN 9  CREATININE 0.78  CALCIUM 7.9*   Liver Function Tests: No results for input(s): AST, ALT, ALKPHOS, BILITOT, PROT, ALBUMIN in the last 168 hours. No results for input(s): LIPASE, AMYLASE in the last 168 hours. No results for input(s): AMMONIA in the last 168 hours. CBC: Recent Labs  Lab 06/05/17 2320  WBC 20.6*  HGB 11.9*  HCT 37.4  MCV 85.4  PLT 315   Cardiac Enzymes: No results for input(s): CKTOTAL, CKMB, CKMBINDEX, TROPONINI in the last 168 hours.  BNP (last 3 results) No  results for input(s): PROBNP in the last 8760 hours.  CBG: No results for input(s): GLUCAP in the last 168 hours.  Urinalysis    Component Value Date/Time   COLORURINE YELLOW 04/29/2017 0519   APPEARANCEUR HAZY (A) 04/29/2017 0519   LABSPEC 1.008 04/29/2017 0519   PHURINE 6.0 04/29/2017 0519   GLUCOSEU NEGATIVE 04/29/2017 0519   GLUCOSEU NEG mg/dL 10/28/2007 2049   HGBUR NEGATIVE 04/29/2017 0519   BILIRUBINUR NEGATIVE 04/29/2017 0519   KETONESUR NEGATIVE 04/29/2017 0519   PROTEINUR NEGATIVE 04/29/2017 0519   UROBILINOGEN 1.0 11/21/2014 0707   NITRITE NEGATIVE 04/29/2017 0519   LEUKOCYTESUR NEGATIVE 04/29/2017 0519      Radiographic Studies: Dg Chest 2 View  Result Date: 06/06/2017 CLINICAL DATA:  Generalized chest pain and shortness of breath. EXAM: CHEST  2 VIEW COMPARISON:  May 26, 2017 FINDINGS: The heart size and mediastinal contours are within normal limits. Both lungs are clear. The visualized skeletal structures are unremarkable. IMPRESSION: No active cardiopulmonary disease. Electronically Signed   By: Dorise Bullion III M.D   On: 06/06/2017 00:01    EKG: Independently reviewed. Sinus rhythm at 109. Normal intervals. Normal axis. P pulmonale is present. Q in V1 and V2. No acute ST-T wave changes noted.   Assessment/Plan:   Active Problems:   Morbid obesity with body mass index of 50.0-59.9 in adult [facility])   Essential hypertension   Hypokalemia   Mild obstructive sleep apnea   Knee pain, bilateral   Tobacco abuse   GERD (gastroesophageal reflux disease)   Asthma exacerbation   Hyperglycemia, drug-induced  ASTHMA  will treat patient with resumption of her previous regimen including steroids, aggressive inhaled bronchodilators and smoking cessation. Chest x-ray is negative.  KNEE PAIN Focal tenderness along soft tissues of medial left knee, no tenderness along her joint lines. Patient admits she spends most of her day in a recliner or in bed. Will check  ultrasound to rule out DVT.  If ultrasound is negative would treat with  for soft tissue pain although would avoid NSAIDs given Eliquis use. Of note patient has known cervical disc dysfunction. I suppose this could also be secondary to lumbar radiculopathy although focal tenderness to palpation argues against that. Can consider imaging lumbar spine if NSAIDs are not effective.  HYPERGLYCEMIA  Of note patient has been hyperglycemic over the past 2 weeks. She denies history of diabetes but I note that prediabetes as listed on her problem list. This may be steroid-induced.  Will put patient on sliding scale insulin and follow. Hemoglobin A1c ordered for tomorrow. Defer starting metformin for now, can be started depending on hemoglobin A1c in a.m.  Of note patient is likely intravascularly volume depleted due to ongoing hyperglycemia. Will hydrate with 1 L bolus and then 125 mL an hour for 24 hours.   AFIB Patient admits that she did not take any of her medications including her heart medications and Eliquis since her discharge. Of note patient is presently in sinus rhythm with a reasonable rate despite not taking any of her medications.  Will continue metoprolol and Eliquis. Holding diltiazem for now.   DIASTOLIC DYSFUNCTION Will restart spironolactone but will hold Lasix for now. Lasix can be restarted as necessary.  As noted above I am hydrating patient for likely intravascular fluid depletion secondary to ongoing hyperglycemia  HTN Patient is normotensive despite not taking her Cardizem, losartan or her metoprolol for 4 days. She may be intravascular fluid depleted at present given ongoing hyperglycemia. Will hydrate and restart her metoprolol for now. Losartan and Cardizem can be  restarted as her blood pressure crimes if warranted  OSA  Continue CPAP at night.  DEPRESSION Continue sertraline and buspirone  GERD Continue PPI and Pepcid  TOBACCO USE Nicotine patch ordered     Other information:   DVT prophylaxis: Lovenox ordered. Code Status: Full code. Family Communication: Patient since the family is aware that she is in house Disposition Plan:  Home Consults called:  None Admission status:  Inpatient   The medical decision making on this patient was of high complexity and the patient is at high risk for clinical deterioration, therefore this is a level 3 visit.  The medical decision making is of moderate complexity, therefore this is a level 2 visit.  Dewaine Oats Derek Jack [facility] Pager [phone] Cell: [phone]   If 7PM-7AM, please contact night-coverage [email or web address] Password [facility] 06/06/2017, 8:43 AM

## 2017-06-06 NOTE — ED Notes (Signed)
Lunch tray delivered.

## 2017-06-06 NOTE — Progress Notes (Signed)
Pt. Refused cpap. RT informed pt. To notify if she changes her mind. 

## 2017-06-06 NOTE — ED Notes (Addendum)
Pt reports L knee pain that radiates down to her L foot.  No swelling noted.  Pain is worse with palpation.  [place] EDP aware.  Pt's breathing appears labored.  Pt is sitting straight up in the stretcher.

## 2017-06-06 NOTE — ED Notes (Signed)
Verified breathing tx w/ admitting provider prior to administration.

## 2017-06-06 NOTE — ED Notes (Signed)
Pt reports her breathing is a little better.  Pain is the same.  Pt ambulated to the BR with steady gait.

## 2017-06-06 NOTE — Progress Notes (Signed)
Left lower extremity venous duplex has been completed. Negative for DVT. Results were given to Dr. Jamse Arn.  06/06/17 9:25 AM April [name] RVT

## 2017-06-06 NOTE — ED Notes (Signed)
Admitting MD at bedside.

## 2017-06-06 NOTE — ED Notes (Signed)
Lunch order sent 

## 2017-06-06 NOTE — ED Notes (Signed)
Ambulated pt on room air - pt did not tolerate for long, got very winded. sats maintained >91%

## 2017-06-07 LAB — BASIC METABOLIC PANEL
ANION GAP: 14 (ref 5–15)
BUN: 14 mg/dL (ref 6–20)
CO2: 17 mmol/L — AB (ref 22–32)
Calcium: 8.1 mg/dL — ABNORMAL LOW (ref 8.9–10.3)
Chloride: 105 mmol/L (ref 101–111)
Creatinine, Ser: 0.8 mg/dL (ref 0.44–1.00)
GFR calc Af Amer: >60 mL/min (ref >60)
GFR calc non Af Amer: >60 mL/min (ref >60)
Glucose, Bld: 234 mg/dL — ABNORMAL HIGH (ref 65–99)
POTASSIUM: 4.4 mmol/L (ref 3.5–5.1)
Sodium: 136 mmol/L (ref 135–145)

## 2017-06-07 LAB — GLUCOSE, CAPILLARY
GLUCOSE-CAPILLARY: 249 mg/dL — AB (ref 65–99)
Glucose-Capillary: 202 mg/dL — ABNORMAL HIGH (ref 65–99)
Glucose-Capillary: 222 mg/dL — ABNORMAL HIGH (ref 65–99)
Glucose-Capillary: 260 mg/dL — ABNORMAL HIGH (ref 65–99)

## 2017-06-07 MED ORDER — CEFTRIAXONE SODIUM 1 G IJ SOLR
1.0000 g | Freq: Once | INTRAMUSCULAR | Status: AC
  Administered 2017-06-07: 1 g via INTRAVENOUS
  Filled 2017-06-07: qty 10

## 2017-06-07 MED ORDER — FUROSEMIDE 40 MG PO TABS
40.0000 mg | ORAL_TABLET | Freq: Every day | ORAL | Status: DC
  Administered 2017-06-07 – 2017-06-08 (×2): 40 mg via ORAL
  Filled 2017-06-07 (×2): qty 1

## 2017-06-07 MED ORDER — DOXYCYCLINE HYCLATE 100 MG PO TABS
100.0000 mg | ORAL_TABLET | Freq: Two times a day (BID) | ORAL | Status: DC
  Administered 2017-06-07 – 2017-06-08 (×3): 100 mg via ORAL
  Filled 2017-06-07 (×3): qty 1

## 2017-06-07 MED ORDER — ZOLPIDEM TARTRATE 5 MG PO TABS
5.0000 mg | ORAL_TABLET | Freq: Every evening | ORAL | Status: DC | PRN
  Filled 2017-06-07: qty 1

## 2017-06-07 MED ORDER — ZOLPIDEM TARTRATE 5 MG PO TABS
10.0000 mg | ORAL_TABLET | Freq: Every evening | ORAL | Status: DC | PRN
  Administered 2017-06-07: 10 mg via ORAL
  Filled 2017-06-07: qty 2

## 2017-06-07 MED ORDER — FAMOTIDINE 40 MG/5ML PO SUSR
20.0000 mg | Freq: Two times a day (BID) | ORAL | Status: DC
  Administered 2017-06-07 – 2017-06-08 (×2): 20 mg via ORAL
  Filled 2017-06-07 (×3): qty 2.5

## 2017-06-07 MED ORDER — DILTIAZEM HCL ER COATED BEADS 120 MG PO CP24
240.0000 mg | ORAL_CAPSULE | ORAL | Status: DC
  Administered 2017-06-07 – 2017-06-08 (×2): 240 mg via ORAL
  Filled 2017-06-07 (×2): qty 2

## 2017-06-07 MED ORDER — HYDROCODONE-ACETAMINOPHEN 5-325 MG PO TABS
1.0000 | ORAL_TABLET | Freq: Four times a day (QID) | ORAL | Status: DC | PRN
  Administered 2017-06-07 – 2017-06-08 (×3): 1 via ORAL
  Filled 2017-06-07 (×2): qty 1

## 2017-06-07 NOTE — Progress Notes (Deleted)
PT Cancellation Note  Patient Details Name: April [name] MRN: [id number] DOB: 02/23/1971   Cancelled Treatment:    Reason Eval/Treat Not Completed: Other (comment). Plan is for pt to have an ultrasound performed to rule out LE DVT. Will await results of ultrasound prior to initiating PT evaluation. PT will continue to f/u with pt as available and appropriate.    [place] 06/07/2017, 2:20 PM

## 2017-06-07 NOTE — Progress Notes (Signed)
Pt. Is complaining of heartburn. MD notified. Verbal order for pepcid oral solution 20mg  bid. Orders placed.

## 2017-06-07 NOTE — Progress Notes (Signed)
[facility] PROGRESS NOTE  April [name] WGN:[id number] DOB: Aug 03, 1970 DOA: 06/05/2017 PCP: [name], MD  Assessment/Plan:    COPD/ASTHMA  will treat patient with resumption of her previous regimen including steroids, aggressive inhaled bronchodilators and smoking cessation advised Started on doxy and rocephin  KNEE PAIN Focal tenderness along soft tissues of medial left knee, no tenderness along her joint lines. Patient admits she spends most of her day in a recliner or in bed, so very deconditioned. Will check ultrasound to rule out DVT, negative.  If ultrasound is negative would treat with  for soft tissue pain although would avoid NSAIDs given Eliquis use. Vicodin while inpt, NSAIDs on d/c PT consult  HYPERGLYCEMIA  Of note patient has been hyperglycemic over the past 2 weeks. She denies history of diabetes but I note that prediabetes as listed on her problem list. This may be steroid-induced.  Will put patient on sliding scale insulin and follow. Hemoglobin A1c ordered for tomorrow. Defer starting metformin for now, can be started depending on hemoglobin A1c in a.m.  Of note patient is likely intravascularly volume depleted due to ongoing hyperglycemia. Will hydrate with 1 L bolus and then 125 mL an hour for 24 hours.   AFIB Patient admits that she did not take any of her medications including her heart medications and Eliquis since her discharge. Of note patient is presently in sinus rhythm with a reasonable rate despite not taking any of her medications.  Will continue metoprolol and Eliquis. Holding diltiazem for now.   DIASTOLIC DYSFUNCTION Cont spironolactone Resume lasix  HTN Patient was normotensive on admit despite not taking her Cardizem, losartan or her metoprolol for 4 days. She may of beeen intravascular fluid depleted at present given ongoing hyperglycemia. Cont  metoprolol for now. Losartan can be restarted as her blood pressure crimes if  warranted Cont cardizem  OSA  Continue CPAP at night.  DEPRESSION Continue sertraline and buspirone No si/hi  GERD Continue PPI and Pepcid  TOBACCO USE Nicotine patch ordered      HPI/Subjective: C/o knee pain. Feels her lungs are still bad. SOB. Denies fever.  Objective: Vitals:   06/07/17 0434 06/07/17 0953  BP: (!) 152/85 (!) 176/98  Pulse: 74 62  Resp: 18 20  Temp: 98.7 F (37.1 C) 98.2 F (36.8 C)  SpO2: 99% 100%    Intake/Output Summary (Last 24 hours) at 06/07/2017 1353 Last data filed at 06/07/2017 0824 Gross per 24 hour  Intake 4098.34 ml  Output 0 ml  Net 4098.34 ml   Filed Weights   06/05/17 2319 06/06/17 2126  Weight: (!) 152.4 kg (336 lb) (!) 157.7 kg (347 lb 11.2 oz)    Exam:   General:  NAD  Cardiovascular: RRR, no MRG  Respiratory: decr air movement, incr wob  Abdomen: ND, BS+  Musculoskeletal: moving all extr   Data Reviewed: Basic Metabolic Panel: Recent Labs  Lab 06/05/17 2320  NA 137  K 3.4*  CL 101  CO2 25  GLUCOSE 204*  BUN 9  CREATININE 0.78  CALCIUM 7.9*   Liver Function Tests: No results for input(s): AST, ALT, ALKPHOS, BILITOT, PROT, ALBUMIN in the last 168 hours. No results for input(s): LIPASE, AMYLASE in the last 168 hours. No results for input(s): AMMONIA in the last 168 hours. CBC: Recent Labs  Lab 06/05/17 2320  WBC 20.6*  HGB 11.9*  HCT 37.4  MCV 85.4  PLT 315   Cardiac Enzymes: No results for input(s): CKTOTAL, CKMB, CKMBINDEX, TROPONINI  in the last 168 hours. BNP (last 3 results) Recent Labs    02/14/17 2105 04/27/17 1254 05/26/17 0757  BNP 15.7 21.7 33.9    ProBNP (last 3 results) No results for input(s): PROBNP in the last 8760 hours.  CBG: Recent Labs  Lab 06/06/17 1208 06/06/17 1705 06/06/17 2216 06/07/17 0736 06/07/17 1212  GLUCAP 275* 214* 164* 202* 222*    No results found for this or any previous visit (from the past 240 hour(s)).   Studies: Dg Chest 2  View  Result Date: 06/06/2017 CLINICAL DATA:  Generalized chest pain and shortness of breath. EXAM: CHEST  2 VIEW COMPARISON:  May 26, 2017 FINDINGS: The heart size and mediastinal contours are within normal limits. Both lungs are clear. The visualized skeletal structures are unremarkable. IMPRESSION: No active cardiopulmonary disease. Electronically Signed   By: Dorise Bullion III M.D   On: 06/06/2017 00:01   Vas [place] Lower Extremity Venous (dvt)  Result Date: 06/06/2017  Lower Venous Study Indication: Pain. Examination Guidelines: A complete evaluation includes B-mode imaging, spectral doppler, color doppler, and power doppler as needed of all accessible portions of each vessel. Bilateral testing is considered an integral part of a complete examination. Limited examinations for reoccurring indications may be performed as noted. The reflux portion of the exam is performed with the patient in reverse Trendelenburg.  Limitations: Body habitus and poor ultrasound/tissue interface.  Right Venous Findings: +---+---------------+---------+-----------+----------+-------+    CompressibilityPhasicitySpontaneityPropertiesSummary +---+---------------+---------+-----------+----------+-------+ CFVFull           Yes      Yes                          +---+---------------+---------+-----------+----------+-------+  Left Venous Findings: +---------+---------------+---------+-----------+----------+-------+          CompressibilityPhasicitySpontaneityPropertiesSummary +---------+---------------+---------+-----------+----------+-------+ CFV      Full           Yes      Yes                          +---------+---------------+---------+-----------+----------+-------+ FV Prox  Full                                                 +---------+---------------+---------+-----------+----------+-------+ FV Mid   Full                                                  +---------+---------------+---------+-----------+----------+-------+ FV DistalFull                                                 +---------+---------------+---------+-----------+----------+-------+ PFV      Full                                                 +---------+---------------+---------+-----------+----------+-------+ POP      Full           Yes      Yes                          +---------+---------------+---------+-----------+----------+-------+  PTV      Full                                                 +---------+---------------+---------+-----------+----------+-------+ PERO     Full                                                 +---------+---------------+---------+-----------+----------+-------+    Final Interpretation: Right: No evidence of common femoral vein obstruction. Left: There is no evidence of deep vein thrombosis in the lower extremity.There is no evidence of superficial venous thrombosis. No cystic structure found in the popliteal fossa.  *See table(s) above for measurements and observations. Electronically signed by Servando Snare on 06/06/2017 at 2:24:20 PM.  --- Final ---   Scheduled Meds: . apixaban  5 mg Oral BID  . busPIRone  7.5 mg Oral BID  . famotidine  20 mg Oral BID  . insulin aspart  0-15 Units Subcutaneous TID WC  . ipratropium  0.5 mg Nebulization Q6H  . levalbuterol  0.63 mg Nebulization Q6H  . methylPREDNISolone (SOLU-MEDROL) injection  40 mg Intravenous Q6H  . metoprolol tartrate  100 mg Oral BID  . montelukast  10 mg Oral QHS  . nicotine  14 mg Transdermal Daily  . pantoprazole  40 mg Oral Daily  . sertraline  100 mg Oral Daily  . spironolactone  100 mg Oral Daily   Continuous Infusions:  Active Problems:   Morbid obesity with body mass index of 50.0-59.9 in adult [facility])   Essential hypertension   Hypokalemia   Mild obstructive sleep apnea   Knee pain, bilateral   Tobacco abuse   GERD (gastroesophageal reflux  disease)   Asthma exacerbation   Hyperglycemia, drug-induced    Time spent: [place]. If 7PM-7AM, please contact night-coverage at [email or web address], password [facility] 06/07/2017, 1:53 PM  LOS: 1 day

## 2017-06-07 NOTE — Evaluation (Addendum)
Physical Therapy Evaluation Patient Details Name: April [name] MRN: [id number] DOB: 11/03/1970 Today's Date: 06/07/2017   History of Present Illness  Pt is a 47 y/o female admitted secondary to worsening SOB secondary to asthma/COPD exacerbation and L knee pain. Pt negative for DVT in L LE. Of note pt has been admitted 10 times in the last six months. PMH including but not limited to COPD, HTN and obesity.     Clinical Impression  Pt presented supine in bed with HOB elevated, awake and willing to participate in therapy session. Prior to admission, pt reported that she was independent with all functional mobility and ADLs. Pt lives alone in a ground level duplex with a couple of steps to enter. Pt has family/friends available to assist if needed. Pt currently performing bed mobility and transfers at a modified independent level (increased time needed). Pt also ambulating in room with supervision without use of an AD. Pt with L knee pain with palpation of medial and superior aspects. Pt also with L ankle pain with palpation of medial and lateral aspects. Pt did not report any worsening L knee or ankle pain with mobility. Pt could benefit from an OP PT Evaluation for further investigation of L knee pain. No further acute PT needs identified at this time. PT signing off.     Follow Up Recommendations Outpatient PT    Equipment Recommendations  None recommended by PT    Recommendations for Other Services       Precautions / Restrictions Precautions Precautions: None Restrictions Weight Bearing Restrictions: No      Mobility  Bed Mobility Overal bed mobility: Modified Independent                Transfers Overall transfer level: Modified independent Equipment used: None                Ambulation/Gait Ambulation/Gait assistance: Supervision Ambulation Distance (Feet): 30 Feet Assistive device: None Gait Pattern/deviations: Step-through pattern Gait velocity:  decreased Gait velocity interpretation: Below normal speed for age/gender General Gait Details: no instability or LOB, no worsening L knee pain with WB'ing  Stairs            Wheelchair Mobility    Modified Rankin (Stroke Patients Only)       Balance Overall balance assessment: Needs assistance Sitting-balance support: Feet supported Sitting balance-Leahy Scale: Normal     Standing balance support: During functional activity;No upper extremity supported Standing balance-Leahy Scale: Good                               Pertinent Vitals/Pain Pain Assessment: Faces Faces Pain Scale: Hurts a little bit Pain Location: L knee (has chronic L knee pain secondary to arthritis with new onset worsening sharp pain at medial aspect of knee and ankle) Pain Descriptors / Indicators: Sharp;Shooting Pain Intervention(s): Monitored during session;Repositioned    Home Living Family/patient expects to be discharged to:: Private residence Living Arrangements: Alone Available Help at Discharge: Family;Friend(s);Available PRN/intermittently Type of Home: Apartment Home Access: Stairs to enter   Entrance Stairs-Number of Steps: 2 Home Layout: One level Home Equipment: Walker - 2 wheels;Cane - quad      Prior Function Level of Independence: Independent               Hand Dominance        Extremity/Trunk Assessment   Upper Extremity Assessment Upper Extremity Assessment: Overall WFL for tasks assessed  Lower Extremity Assessment Lower Extremity Assessment: Overall WFL for tasks assessed    Cervical / Trunk Assessment Cervical / Trunk Assessment: Other exceptions Cervical / Trunk Exceptions: body habitus  Communication   Communication: No difficulties  Cognition Arousal/Alertness: Awake/alert Behavior During Therapy: WFL for tasks assessed/performed Overall Cognitive Status: Within Functional Limits for tasks assessed                                         General Comments      Exercises     Assessment/Plan    PT Assessment All further PT needs can be met in the next venue of care  PT Problem List Pain       PT Treatment Interventions      PT Goals (Current goals can be found in the Care Plan section)  Acute Rehab PT Goals Patient Stated Goal: decrease pain    Frequency     Barriers to discharge        Co-evaluation               AM-PAC PT "6 Clicks" Daily Activity  Outcome Measure Difficulty turning over in bed (including adjusting bedclothes, sheets and blankets)?: None Difficulty moving from lying on back to sitting on the side of the bed? : None Difficulty sitting down on and standing up from a chair with arms (e.g., wheelchair, bedside commode, etc,.)?: None Help needed moving to and from a bed to chair (including a wheelchair)?: None Help needed walking in hospital room?: None Help needed climbing 3-5 steps with a railing? : None 6 Click Score: 24    End of Session   Activity Tolerance: Patient tolerated treatment well Patient left: in bed;with call bell/phone within reach;with family/visitor present;Other (comment)(RT in room starting a breathing treatment) Nurse Communication: Mobility status PT Visit Diagnosis: Pain Pain - Right/Left: Left Pain - part of body: Knee;Leg    Time: 1425-1440 PT Time Calculation (min) (ACUTE ONLY): 15 min   Charges:   PT Evaluation $PT Eval Low Complexity: 1 Low     PT G Codes:        [place], PT, DPT [place] 06/07/2017, 3:05 PM

## 2017-06-08 LAB — CBC WITH DIFFERENTIAL/PLATELET
Basophils Absolute: 0 10*3/uL (ref 0.0–0.1)
Basophils Relative: 0 %
EOS ABS: 0 10*3/uL (ref 0.0–0.7)
EOS PCT: 0 %
HCT: 35.8 % — ABNORMAL LOW (ref 36.0–46.0)
Hemoglobin: 11.4 g/dL — ABNORMAL LOW (ref 12.0–15.0)
LYMPHS ABS: 1.1 10*3/uL (ref 0.7–4.0)
Lymphocytes Relative: 4 %
MCH: 27.6 pg (ref 26.0–34.0)
MCHC: 31.8 g/dL (ref 30.0–36.0)
MCV: 86.7 fL (ref 78.0–100.0)
MONO ABS: 1.4 10*3/uL — AB (ref 0.1–1.0)
Monocytes Relative: 5 %
Neutro Abs: 25.8 10*3/uL — ABNORMAL HIGH (ref 1.7–7.7)
Neutrophils Relative %: 91 %
PLATELETS: 302 10*3/uL (ref 150–400)
RBC: 4.13 MIL/uL (ref 3.87–5.11)
RDW: 17 % — AB (ref 11.5–15.5)
WBC: 28.3 10*3/uL — AB (ref 4.0–10.5)

## 2017-06-08 LAB — GLUCOSE, CAPILLARY
GLUCOSE-CAPILLARY: 270 mg/dL — AB (ref 65–99)
Glucose-Capillary: 305 mg/dL — ABNORMAL HIGH (ref 65–99)

## 2017-06-08 MED ORDER — DOXYCYCLINE HYCLATE 100 MG PO TABS: 100.0000 mg | ORAL_TABLET | Freq: Two times a day (BID) | ORAL | 0 refills | Status: AC

## 2017-06-08 MED ORDER — ALBUTEROL SULFATE HFA 108 (90 BASE) MCG/ACT IN AERS: 2.0000 | INHALATION_SPRAY | RESPIRATORY_TRACT | 0 refills | Status: DC | PRN

## 2017-06-08 MED ORDER — TIOTROPIUM BROMIDE MONOHYDRATE 18 MCG IN CAPS: 18.0000 ug | ORAL_CAPSULE | Freq: Every day | RESPIRATORY_TRACT | 1 refills | Status: DC

## 2017-06-08 MED ORDER — PREDNISONE 20 MG PO TABS: 60.0000 mg | ORAL_TABLET | Freq: Every day | ORAL | 0 refills | Status: AC

## 2017-06-08 MED ORDER — BUDESONIDE 0.25 MG/2ML IN SUSP: 0.2500 mg | Freq: Two times a day (BID) | RESPIRATORY_TRACT | 0 refills | Status: DC

## 2017-06-08 MED ORDER — ARFORMOTEROL TARTRATE 15 MCG/2ML IN NEBU: 15.0000 ug | INHALATION_SOLUTION | Freq: Two times a day (BID) | RESPIRATORY_TRACT | 1 refills | Status: DC

## 2017-06-08 NOTE — Progress Notes (Signed)
Entered room to give patient scheduled solumedrol. Patient was short of breath and there was an overwhelming smell of cigarette smoke and perfume in her room. This was confirmed by April Romans, RN and respiratory therapist who also went into April [name]'s room. I reinforced [facility] policy of no smoking in [facility] facilities or campus. I educated patient on the dangers of smoking for herself and others in the hospital. Patient denies smoking. States, "It wasn't me."

## 2017-06-08 NOTE — Progress Notes (Signed)
April [name] to be D/C'd Home per MD order.  Discussed prescriptions and follow up appointments with the patient. Prescriptions given to patient, medication list explained in detail. Pt verbalized understanding.  Allergies as of 06/08/2017   No Known Allergies     Medication List    TAKE these medications   albuterol 108 (90 Base) MCG/ACT inhaler Commonly known as:  PROVENTIL HFA;VENTOLIN HFA Inhale 2 puffs into the lungs every 4 (four) hours as needed for wheezing or shortness of breath.   apixaban 5 MG Tabs tablet Commonly known as:  ELIQUIS Take 1 tablet (5 mg total) by mouth 2 (two) times daily.   arformoterol 15 MCG/2ML Nebu Commonly known as:  BROVANA Take 2 mLs (15 mcg total) by nebulization 2 (two) times daily.   budesonide 0.25 MG/2ML nebulizer solution Commonly known as:  PULMICORT Take 2 mLs (0.25 mg total) by nebulization 2 (two) times daily.   busPIRone 7.5 MG tablet Commonly known as:  BUSPAR Take 1 tablet (7.5 mg total) 2 (two) times daily by mouth.   butalbital-acetaminophen-caffeine 50-325-40 MG tablet Commonly known as:  FIORICET, ESGIC Take 1-2 tablets by mouth every 6 (six) hours as needed for headache.   diltiazem 240 MG 24 hr capsule Commonly known as:  CARDIZEM CD Take 1 capsule (240 mg total) by mouth daily.   doxycycline 100 MG tablet Commonly known as:  VIBRA-TABS Take 1 tablet (100 mg total) by mouth every 12 (twelve) hours for 8 days.   famotidine 20 MG tablet Commonly known as:  PEPCID Take 1 tablet (20 mg total) by mouth 2 (two) times daily.   fluticasone 50 MCG/ACT nasal spray Commonly known as:  FLONASE Place 2 sprays into both nostrils daily.   furosemide 40 MG tablet Commonly known as:  LASIX Take 1 tablet (40 mg total) daily by mouth.   guaiFENesin 600 MG 12 hr tablet Commonly known as:  MUCINEX Take 1 tablet (600 mg total) by mouth 2 (two) times daily.   levalbuterol 0.63 MG/3ML nebulizer solution Commonly known as:   XOPENEX Take 3 mLs (0.63 mg total) by nebulization every 6 (six) hours.   loratadine 10 MG tablet Commonly known as:  CLARITIN Take 1 tablet (10 mg total) by mouth daily.   losartan 100 MG tablet Commonly known as:  COZAAR Take 1 tablet (100 mg total) daily by mouth.   metoprolol tartrate 100 MG tablet Commonly known as:  LOPRESSOR Take 1 tablet (100 mg total) by mouth 2 (two) times daily.   montelukast 10 MG tablet Commonly known as:  SINGULAIR Take 1 tablet (10 mg total) at bedtime by mouth.   nicotine 14 mg/24hr patch Commonly known as:  NICODERM CQ - dosed in mg/24 hours Place 1 patch (14 mg total) onto the skin daily.   omeprazole 20 MG capsule Commonly known as:  PRILOSEC Take 1 capsule (20 mg total) by mouth 2 (two) times daily.   predniSONE 20 MG tablet Commonly known as:  DELTASONE Take 3 tablets (60 mg total) by mouth daily with breakfast for 6 days. Take 3 tablet by mouth for 5  days then 2 tablets for 3 days then go back to 1 tablet daily What changed:    how much to take  how to take this  when to take this   sertraline 100 MG tablet Commonly known as:  ZOLOFT Take 1 tablet (100 mg total) daily by mouth.   spironolactone 100 MG tablet Commonly known as:  ALDACTONE Take  1 tablet (100 mg total) daily by mouth.   tiotropium 18 MCG inhalation capsule Commonly known as:  SPIRIVA HANDIHALER Place 1 capsule (18 mcg total) into inhaler and inhale daily.   Vitamin D (Ergocalciferol) 50000 units Caps capsule Commonly known as:  DRISDOL Take 1 capsule (50,000 Units total) by mouth every 7 (seven) days.   zolpidem 10 MG tablet Commonly known as:  AMBIEN Take 10 mg by mouth at bedtime.       Vitals:   06/08/17 0517 06/08/17 0851  BP: (!) 145/55 (!) 168/92  Pulse: 60 (!) 54  Resp: 18 20  Temp: 98.8 F (37.1 C) 98.2 F (36.8 C)  SpO2: 100% 98%    Skin clean, dry and intact without evidence of skin break down, no evidence of skin tears noted. IV  catheter discontinued intact. Site without signs and symptoms of complications. Dressing and pressure applied. Pt denies pain at this time. No complaints noted.  An After Visit Summary was printed and given to the patient. Patient escorted via [place], and D/C home via private auto.  Chuck Hint RN [facility] [employer]

## 2017-06-08 NOTE — Discharge Summary (Signed)
Physician Discharge Summary  April [name] TFT:[id number] DOB: 01/07/1971 DOA: 06/05/2017  PCP: [name], MD  Admit date: 06/05/2017 Discharge date: 06/08/2017  Time spent: 35 minutes  Recommendations for Outpatient Follow-up:  1. F/U PCP in next 10 days   Discharge Diagnoses:  Active Problems:   Morbid obesity with body mass index of 50.0-59.9 in adult [facility])   Essential hypertension   Hypokalemia   Mild obstructive sleep apnea   Knee pain, bilateral   Tobacco abuse   GERD (gastroesophageal reflux disease)   Asthma exacerbation   Hyperglycemia, drug-induced   Discharge Condition: Stable  Diet recommendation: Healthy Diet  Filed Weights   06/05/17 2319 06/06/17 2126 06/07/17 2142  Weight: (!) 152.4 kg (336 lb) (!) 157.7 kg (347 lb 11.2 oz) (!) 157.8 kg (347 lb 14.2 oz)    History of present illness:  April [name] is an 47 y.o. female with past medical history significant for asthma, obesity, obstructive sleep apnea, atrial fibrillation who was recently discharged from [facility] 4 days ago after treatment for asthma. Patient agrees that she was supposed to stay an extra day but wanted to go home. Patient states she did okay for 1-2 days after discharge however 2 days prior to admission had sudden onset left knee pain. She notes is the worst pain that she's ever had much worse than her usual left knee arthritis pain. Patient notes it radiates downwards and has prevented her from walking. Patient denies that it feels like a sharp shock shooting down her leg. Patient denies back pain. Patient notes that is an aching throbbing pain that is not positional although it does get worse with weightbearing.  Patient also notes worsening of her respiratory status over the past couple of days. Patient admits that she is not compliant with her medications however states she was taking her inhaled bronchodilators as well as her prednisone. She admits she was not taking her heart  medications.  Patient denies fevers or chills admits to him and sweats times many years without change. She admits to rare cough which is also without change. Patient notes that she continues to smoke tobacco although has cut down on the past week.  Patient does admit to increased thirst lately as well as increased urination. Denies any weight loss. Admits to fatigue but notes that's not different from baseline.    Hospital Course:  Treated with nebulizer treatments and steroids.  Possible COPD component antibiotics was added.  She did improve greatly so was discharged home.  She had highlighted cost issues.  Case management was called to try to assist patient.  Procedures:  Vas [place] Dopple LE Bil : Final Interpretation: Right: No evidence of common femoral vein obstruction. Left: There is no evidence of deep vein thrombosis in the lower extremity.There is no evidence of superficial venous thrombosis. No cystic structure found in the popliteal fossa.  Consultations:  nad  Discharge Exam: Vitals:   06/08/17 0517 06/08/17 0851  BP: (!) 145/55 (!) 168/92  Pulse: 60 (!) 54  Resp: 18 20  Temp: 98.8 F (37.1 C) 98.2 F (36.8 C)  SpO2: 100% 98%    General: NCAT, NAd Cardiovascular: RRR, no MRG Respiratory: CTAB, nl wob  Discharge Instructions   Discharge Instructions    Call MD for:  difficulty breathing, headache or visual disturbances   Complete by:  As directed    Call MD for:  extreme fatigue   Complete by:  As directed    Call MD  for:  persistant dizziness or light-headedness   Complete by:  As directed    Call MD for:  temperature >100.4   Complete by:  As directed    Diet - low sodium heart healthy   Complete by:  As directed    Discharge instructions   Complete by:  As directed    See your doctor by next Friday  Discuss your asthma, smoking cessation and knee pain   Increase activity slowly   Complete by:  As directed      Allergies as of 06/08/2017   No  Known Allergies     Medication List    TAKE these medications   albuterol 108 (90 Base) MCG/ACT inhaler Commonly known as:  PROVENTIL HFA;VENTOLIN HFA Inhale 2 puffs into the lungs every 4 (four) hours as needed for wheezing or shortness of breath.   apixaban 5 MG Tabs tablet Commonly known as:  ELIQUIS Take 1 tablet (5 mg total) by mouth 2 (two) times daily.   arformoterol 15 MCG/2ML Nebu Commonly known as:  BROVANA Take 2 mLs (15 mcg total) by nebulization 2 (two) times daily.   budesonide 0.25 MG/2ML nebulizer solution Commonly known as:  PULMICORT Take 2 mLs (0.25 mg total) by nebulization 2 (two) times daily.   busPIRone 7.5 MG tablet Commonly known as:  BUSPAR Take 1 tablet (7.5 mg total) 2 (two) times daily by mouth.   butalbital-acetaminophen-caffeine 50-325-40 MG tablet Commonly known as:  FIORICET, ESGIC Take 1-2 tablets by mouth every 6 (six) hours as needed for headache.   diltiazem 240 MG 24 hr capsule Commonly known as:  CARDIZEM CD Take 1 capsule (240 mg total) by mouth daily.   doxycycline 100 MG tablet Commonly known as:  VIBRA-TABS Take 1 tablet (100 mg total) by mouth every 12 (twelve) hours for 8 days.   famotidine 20 MG tablet Commonly known as:  PEPCID Take 1 tablet (20 mg total) by mouth 2 (two) times daily.   fluticasone 50 MCG/ACT nasal spray Commonly known as:  FLONASE Place 2 sprays into both nostrils daily.   furosemide 40 MG tablet Commonly known as:  LASIX Take 1 tablet (40 mg total) daily by mouth.   guaiFENesin 600 MG 12 hr tablet Commonly known as:  MUCINEX Take 1 tablet (600 mg total) by mouth 2 (two) times daily.   levalbuterol 0.63 MG/3ML nebulizer solution Commonly known as:  XOPENEX Take 3 mLs (0.63 mg total) by nebulization every 6 (six) hours.   loratadine 10 MG tablet Commonly known as:  CLARITIN Take 1 tablet (10 mg total) by mouth daily.   losartan 100 MG tablet Commonly known as:  COZAAR Take 1 tablet (100 mg  total) daily by mouth.   metoprolol tartrate 100 MG tablet Commonly known as:  LOPRESSOR Take 1 tablet (100 mg total) by mouth 2 (two) times daily.   montelukast 10 MG tablet Commonly known as:  SINGULAIR Take 1 tablet (10 mg total) at bedtime by mouth.   nicotine 14 mg/24hr patch Commonly known as:  NICODERM CQ - dosed in mg/24 hours Place 1 patch (14 mg total) onto the skin daily.   omeprazole 20 MG capsule Commonly known as:  PRILOSEC Take 1 capsule (20 mg total) by mouth 2 (two) times daily.   predniSONE 20 MG tablet Commonly known as:  DELTASONE Take 3 tablets (60 mg total) by mouth daily with breakfast for 6 days. Take 3 tablet by mouth for 5  days then 2 tablets for  3 days then go back to 1 tablet daily What changed:    how much to take  how to take this  when to take this   sertraline 100 MG tablet Commonly known as:  ZOLOFT Take 1 tablet (100 mg total) daily by mouth.   spironolactone 100 MG tablet Commonly known as:  ALDACTONE Take 1 tablet (100 mg total) daily by mouth.   tiotropium 18 MCG inhalation capsule Commonly known as:  SPIRIVA HANDIHALER Place 1 capsule (18 mcg total) into inhaler and inhale daily.   Vitamin D (Ergocalciferol) 50000 units Caps capsule Commonly known as:  DRISDOL Take 1 capsule (50,000 Units total) by mouth every 7 (seven) days.   zolpidem 10 MG tablet Commonly known as:  AMBIEN Take 10 mg by mouth at bedtime.      No Known Allergies Follow-up Information    [facility] Follow up.   Why:  Follow up with PCP to assist with Disease Management and Rx Contact information: [address] [place] [address] [phone]           The results of significant diagnostics from this hospitalization (including imaging, microbiology, ancillary and laboratory) are listed below for reference.    Significant Diagnostic Studies: Dg Chest 2 View  Result Date: 06/06/2017 CLINICAL  DATA:  Generalized chest pain and shortness of breath. EXAM: CHEST  2 VIEW COMPARISON:  May 26, 2017 FINDINGS: The heart size and mediastinal contours are within normal limits. Both lungs are clear. The visualized skeletal structures are unremarkable. IMPRESSION: No active cardiopulmonary disease. Electronically Signed   By: Dorise Bullion III M.D   On: 06/06/2017 00:01   Dg Chest 2 View  Result Date: 05/26/2017 CLINICAL DATA:  Reports asthma flair for a couple of days worse today. No relief using inhaler at home. Also reports non productive cough. EXAM: CHEST  2 VIEW COMPARISON:  Chest x-ray dated 04/28/2017. FINDINGS: Heart size and mediastinal contours are within normal limits. Lungs are clear. No pleural effusion or pneumothorax seen. Osseous structures about the chest are unremarkable. IMPRESSION: No active cardiopulmonary disease. No evidence of pneumonia or pulmonary edema. Electronically Signed   By: Franki Cabot M.D.   On: 05/26/2017 01:14   Vas [place] Lower Extremity Venous (dvt)  Result Date: 06/06/2017  Lower Venous Study Indication: Pain. Examination Guidelines: A complete evaluation includes B-mode imaging, spectral doppler, color doppler, and power doppler as needed of all accessible portions of each vessel. Bilateral testing is considered an integral part of a complete examination. Limited examinations for reoccurring indications may be performed as noted. The reflux portion of the exam is performed with the patient in reverse Trendelenburg.  Limitations: Body habitus and poor ultrasound/tissue interface.  Right Venous Findings: +---+---------------+---------+-----------+----------+-------+    CompressibilityPhasicitySpontaneityPropertiesSummary +---+---------------+---------+-----------+----------+-------+ CFVFull           Yes      Yes                          +---+---------------+---------+-----------+----------+-------+  Left Venous Findings:  +---------+---------------+---------+-----------+----------+-------+          CompressibilityPhasicitySpontaneityPropertiesSummary +---------+---------------+---------+-----------+----------+-------+ CFV      Full           Yes      Yes                          +---------+---------------+---------+-----------+----------+-------+ FV Prox  Full                                                 +---------+---------------+---------+-----------+----------+-------+  FV Mid   Full                                                 +---------+---------------+---------+-----------+----------+-------+ FV DistalFull                                                 +---------+---------------+---------+-----------+----------+-------+ PFV      Full                                                 +---------+---------------+---------+-----------+----------+-------+ POP      Full           Yes      Yes                          +---------+---------------+---------+-----------+----------+-------+ PTV      Full                                                 +---------+---------------+---------+-----------+----------+-------+ PERO     Full                                                 +---------+---------------+---------+-----------+----------+-------+    Final Interpretation: Right: No evidence of common femoral vein obstruction. Left: There is no evidence of deep vein thrombosis in the lower extremity.There is no evidence of superficial venous thrombosis. No cystic structure found in the popliteal fossa.  *See table(s) above for measurements and observations. Electronically signed by Servando Snare on 06/06/2017 at 2:24:20 PM.   Final    Microbiology: No results found for this or any previous visit (from the past 240 hour(s)).   Labs: Basic Metabolic Panel: Recent Labs  Lab 06/05/17 2320 06/07/17 1430  NA 137 136  K 3.4* 4.4  CL 101 105  CO2 25 17*  GLUCOSE 204*  234*  BUN 9 14  CREATININE 0.78 0.80  CALCIUM 7.9* 8.1*   Liver Function Tests: No results for input(s): AST, ALT, ALKPHOS, BILITOT, PROT, ALBUMIN in the last 168 hours. No results for input(s): LIPASE, AMYLASE in the last 168 hours. No results for input(s): AMMONIA in the last 168 hours. CBC: Recent Labs  Lab 06/05/17 2320 06/08/17 0707  WBC 20.6* 28.3*  NEUTROABS  --  25.8*  HGB 11.9* 11.4*  HCT 37.4 35.8*  MCV 85.4 86.7  PLT 315 302   Cardiac Enzymes: No results for input(s): CKTOTAL, CKMB, CKMBINDEX, TROPONINI in the last 168 hours. BNP: BNP (last 3 results) Recent Labs    02/14/17 2105 04/27/17 1254 05/26/17 0757  BNP 15.7 21.7 33.9    ProBNP (last 3 results) No results for input(s): PROBNP in the last 8760 hours.  CBG: Recent Labs  Lab 06/07/17 1212 06/07/17 1705 06/07/17 2144 06/08/17 0736 06/08/17 1157  GLUCAP 222* 260*  249* 270* 305*       Signed:  Elwin Mocha MD  FACP  [facility] 06/08/2017, 4:18 PM

## 2017-06-08 NOTE — Progress Notes (Signed)
Upon doing patient discharge, patient became very agitated about having to leave the hospital. She explained she can't get her medications and pay the $4 copays and she'd be back in the hospital tonight. Case management spoke with the patient of her medications and of her insurance coverage. Patient stated she wanted hospital pharmacy to send her home with inhalers and I explained to her that we do not give inhalers to patient's at discharge. MD notified.

## 2017-06-08 NOTE — Care Management Note (Signed)
Case Management Note  Patient Details  Name: April [name] MRN: [id number] Date of Birth: 12-16-1970  Subjective/Objective:   Spoke with pt at length re: discharge medications and her need for assist with $4.00 co-pays. CM shared with pt that dept could not assist her, but Pharmacy could not deny her lifesaving medications for lack of co-pay. We talked about priorities of having medications to keep her out of the hospital, we talked about avoidance of smoking and avoidance of second hand smoke. She is committed to seeing PCP at [facility] on "Monday 06/09/2017". Seems to be just as committed to remaining in hospital as she feels she is not yet at [place]"              Action/Plan:CM will sign off for now but will be available should additional discharge needs arise or disposition change.    Expected Discharge Date:  06/09/17               Expected Discharge Plan:  Home/Self Care  In-House Referral:  NA  Discharge planning Services  CM Consult, Medication Assistance  Post Acute Care Choice:  NA Choice offered to:  Patient  DME Arranged:    DME Agency:     HH Arranged:    [place] Agency:     Status of Service:  Completed, signed off  If discussed at [employer] of Stay Meetings, dates discussed:    Additional Comments:  Delrae Sawyers, RN 06/08/2017, 12:46 PM

## 2017-06-09 ENCOUNTER — Telehealth: Payer: Self-pay

## 2017-06-09 NOTE — Telephone Encounter (Signed)
Attempted to contact the patient to discuss scheduling a follow up medical appointment at [facility].  She is well known to the [facility]. Call placed to # [phone] (M) and a HIPAA compliant voicemail message was left requesting a call back to # [phone].

## 2017-06-16 ENCOUNTER — Ambulatory Visit: Payer: Medicaid Other | Admitting: Physician Assistant

## 2017-06-16 ENCOUNTER — Telehealth: Payer: Self-pay | Admitting: Family Medicine

## 2017-06-16 NOTE — Telephone Encounter (Signed)
Message received from patient requesting a call back. This CM spoke to patient and she said that just needed Dr Janee Morn phone # and that # was provided to her.

## 2017-06-16 NOTE — Telephone Encounter (Signed)
Attempted to contact the patient.  Call placed to # [phone] (M) and a HIPAA compliant voicemail message was left requesting a call back to # [phone]

## 2017-06-16 NOTE — Progress Notes (Deleted)
Cardiology Office Note   Date:  06/16/2017   ID:  April [name], DOB Dec 12, 1970, MRN [id number]  PCP:  [name], MD  Cardiologist: Dr. Sallyanne Kuster, 05/30/2017 in hospital Rosaria Ferries, PA-C   No chief complaint on file.   History of Present Illness: April [name] is a 47 y.o. female with a history of of asthma, COPD, GERD, HTN, OSA on cpap, D-CHF, and obesity, CHA2DS2VASc= 3 (female, HTN, CHF)  Admitted 1/28-06/01/2017 for acute on chronic respiratory failure due to asthma/COPD exacerbation, and was seen by cardiology for atrial fib, RVR, s/p DCCV, on Eliquis Admitted 2/7-2/01/2018 for asthma/COPD exacerbation  April [name] presents for ***   Past Medical History:  Diagnosis Date  . Acanthosis nigricans   . Anxiety   . Arthritis    "knees" (04/28/2017)  . Asthma    Followed by Dr. Annamaria Boots (pulmonology); receives every other week omalizumab injections; has frequent exacerbations  . COPD (chronic obstructive pulmonary disease) ([place])    PFTs in 2002, FEV1/FVC 65, no post bronchodilater test done  . Depression   . GERD (gastroesophageal reflux disease)   . Headache(784.0)    "q couple days" (04/28/2017)  . Helicobacter pylori (H. pylori) infection   . Hypertension, essential   . Insomnia   . Menorrhagia   . Morbid obesity ([place])   . Obesity   . OSA on CPAP    Sleep study 2008 - mild OSA, not enough events to titrate CPAP; wears CPAP now/pt on 04/28/2017  . Pneumonia X 1  . Seasonal allergies   . Shortness of breath   . Tobacco user     Past Surgical History:  Procedure Laterality Date  . CARDIOVERSION N/A 05/30/2017   Procedure: CARDIOVERSION;  Surgeon: Sanda Klein, MD;  Location: [place] [facility]OSCOPY;  Service: Cardiovascular;  Laterality: N/A;  . REDUCTION MAMMAPLASTY Bilateral 09/2011  . TUBAL LIGATION  1996   bilateral    Current Outpatient Medications  Medication Sig Dispense Refill  . albuterol (PROVENTIL HFA;VENTOLIN HFA) 108 (90 Base) MCG/ACT inhaler Inhale  2 puffs into the lungs every 4 (four) hours as needed for wheezing or shortness of breath. 1 Inhaler 0  . apixaban (ELIQUIS) 5 MG TABS tablet Take 1 tablet (5 mg total) by mouth 2 (two) times daily. 60 tablet 1  . arformoterol (BROVANA) 15 MCG/2ML NEBU Take 2 mLs (15 mcg total) by nebulization 2 (two) times daily. 120 mL 1  . budesonide (PULMICORT) 0.25 MG/2ML nebulizer solution Take 2 mLs (0.25 mg total) by nebulization 2 (two) times daily. 60 mL 0  . busPIRone (BUSPAR) 7.5 MG tablet Take 1 tablet (7.5 mg total) 2 (two) times daily by mouth. 60 tablet 3  . butalbital-acetaminophen-caffeine (FIORICET, ESGIC) 50-325-40 MG tablet Take 1-2 tablets by mouth every 6 (six) hours as needed for headache. 20 tablet 0  . diltiazem (CARDIZEM CD) 240 MG 24 hr capsule Take 1 capsule (240 mg total) by mouth daily. 30 capsule 1  . doxycycline (VIBRA-TABS) 100 MG tablet Take 1 tablet (100 mg total) by mouth every 12 (twelve) hours for 8 days. 16 tablet 0  . famotidine (PEPCID) 20 MG tablet Take 1 tablet (20 mg total) by mouth 2 (two) times daily. 30 tablet 0  . fluticasone (FLONASE) 50 MCG/ACT nasal spray Place 2 sprays into both nostrils daily. 16 g 6  . furosemide (LASIX) 40 MG tablet Take 1 tablet (40 mg total) daily by mouth. 30 tablet 3  . guaiFENesin (MUCINEX) 600 MG 12  hr tablet Take 1 tablet (600 mg total) by mouth 2 (two) times daily. 30 tablet 0  . levalbuterol (XOPENEX) 0.63 MG/3ML nebulizer solution Take 3 mLs (0.63 mg total) by nebulization every 6 (six) hours. 3 mL 12  . loratadine (CLARITIN) 10 MG tablet Take 1 tablet (10 mg total) by mouth daily. 90 tablet 3  . losartan (COZAAR) 100 MG tablet Take 1 tablet (100 mg total) daily by mouth. 30 tablet 3  . metoprolol tartrate (LOPRESSOR) 100 MG tablet Take 1 tablet (100 mg total) by mouth 2 (two) times daily. 60 tablet 3  . montelukast (SINGULAIR) 10 MG tablet Take 1 tablet (10 mg total) at bedtime by mouth. 30 tablet 3  . nicotine (NICODERM CQ - DOSED  IN MG/24 HOURS) 14 mg/24hr patch Place 1 patch (14 mg total) onto the skin daily. 28 patch 0  . omeprazole (PRILOSEC) 20 MG capsule Take 1 capsule (20 mg total) by mouth 2 (two) times daily.    . sertraline (ZOLOFT) 100 MG tablet Take 1 tablet (100 mg total) daily by mouth. 60 tablet 3  . spironolactone (ALDACTONE) 100 MG tablet Take 1 tablet (100 mg total) daily by mouth. 30 tablet 3  . tiotropium (SPIRIVA HANDIHALER) 18 MCG inhalation capsule Place 1 capsule (18 mcg total) into inhaler and inhale daily. 30 capsule 1  . Vitamin D, Ergocalciferol, (DRISDOL) 50000 units CAPS capsule Take 1 capsule (50,000 Units total) by mouth every 7 (seven) days. 6 capsule 0  . zolpidem (AMBIEN) 10 MG tablet Take 10 mg by mouth at bedtime.     No current facility-administered medications for this visit.     Allergies:   Patient has no known allergies.    Social History:  The patient  reports that she quit smoking about 2 years ago. Her smoking use included cigarettes. She has a 13.00 pack-year smoking history. she has never used smokeless tobacco. She reports that she does not drink alcohol or use drugs.   Family History:  The patient's family history includes Asthma in her daughter and maternal grandmother; Cancer in her paternal aunt; Hypertension in her mother.    ROS:  Please see the history of present illness. All other systems are reviewed and negative.    PHYSICAL EXAM: VS:  There were no vitals taken for this visit. , BMI There is no height or weight on file to calculate BMI. GEN: Well nourished, well developed, female in no acute distress  HEENT: normal for age  Neck: no JVD, no carotid bruit, no masses Cardiac: RRR; no murmur, no rubs, or gallops Respiratory:  clear to auscultation bilaterally, normal work of breathing GI: soft, nontender, nondistended, + BS MS: no deformity or atrophy; no edema; distal pulses are 2+ in all 4 extremities   Skin: warm and dry, no rash Neuro:  Strength and  sensation are intact Psych: euthymic mood, full affect   EKG:  EKG {ACTION; IS/IS DPO:[id number]} ordered today. The ekg ordered today demonstrates ***  CATH: 10/03/2016 - Left ventricle: The cavity size was normal. Wall thickness was   increased in a pattern of mild LVH. Systolic function was normal.   The estimated ejection fraction was in the range of 55% to 60%.   Although no diagnostic regional wall motion abnormality was   identified, this possibility cannot be completely excluded on the   basis of this study. Doppler parameters are consistent with   abnormal left ventricular relaxation (grade 1 diastolic   dysfunction). - Aortic valve:  There was no stenosis. - Mitral valve: There was no significant regurgitation. - Right ventricle: The cavity size was normal. Systolic function   was normal. - Tricuspid valve: Peak RV-RA gradient (S): 27 mm Hg. - Pulmonary arteries: PA peak pressure: 30 mm Hg (S). - Inferior vena cava: The vessel was normal in size. The   respirophasic diameter changes were in the normal range (>= 50%),   consistent with normal central venous pressure. Impressions: - Normal LV size with mild LV hypertrophy. EF 55-60%. Normal RV   size and systolic function. No significant valvular   abnormalities.   Recent Labs: 12/27/2016: TSH 2.470 04/28/2017: ALT 16 05/26/2017: B Natriuretic Peptide 33.9 05/28/2017: Magnesium 2.1 06/07/2017: BUN 14; Creatinine, Ser 0.80; Potassium 4.4; Sodium 136 06/08/2017: Hemoglobin 11.4; Platelets 302    Lipid Panel    Component Value Date/Time   CHOL 183 10/06/2009 2113   TRIG 67 10/06/2009 2113   HDL 47 10/06/2009 2113   CHOLHDL 3.9 Ratio 10/06/2009 2113   VLDL 13 10/06/2009 2113   LDLCALC 123 (H) 10/06/2009 2113     Wt Readings from Last 3 Encounters:  06/07/17 (!) 347 lb 14.2 oz (157.8 kg)  06/01/17 (!) 336 lb 11.2 oz (152.7 kg)  04/29/17 (!) 326 lb 15.1 oz (148.3 kg)     Other studies Reviewed: Additional studies/  records that were reviewed today include: ***.  ASSESSMENT AND PLAN:  1.  ***   Current medicines are reviewed at length with the patient today.  The patient {ACTIONS; HAS/DOES NOT HAVE:19233} concerns regarding medicines.  The following changes have been made:  {PLAN; NO CHANGE:13088:s}  Labs/ tests ordered today include: *** No orders of the defined types were placed in this encounter.    Disposition:   FU with ***  Signed, Rosaria Ferries, PA-C  06/16/2017 7:50 AM    [place] Phone: [phone]; Fax: [phone]  This note was written with the assistance of speech recognition software. Please excuse any transcriptional errors.

## 2017-06-16 NOTE — Telephone Encounter (Signed)
Patient requested to speak with you. Please fu.

## 2017-06-18 ENCOUNTER — Other Ambulatory Visit: Payer: Self-pay | Admitting: Internal Medicine

## 2017-06-19 ENCOUNTER — Ambulatory Visit: Payer: Medicaid Other | Admitting: Internal Medicine

## 2017-06-20 ENCOUNTER — Telehealth: Payer: Self-pay

## 2017-06-20 ENCOUNTER — Telehealth: Payer: Self-pay | Admitting: Family Medicine

## 2017-06-20 DIAGNOSIS — J449 Chronic obstructive pulmonary disease, unspecified: Secondary | ICD-10-CM

## 2017-06-20 MED ORDER — MOMETASONE FURO-FORMOTEROL FUM 200-5 MCG/ACT IN AERO: 2.0000 | INHALATION_SPRAY | Freq: Two times a day (BID) | RESPIRATORY_TRACT | 2 refills | Status: DC

## 2017-06-20 MED ORDER — PREDNISONE 20 MG PO TABS: 20.0000 mg | ORAL_TABLET | Freq: Every day | ORAL | 0 refills | Status: DC

## 2017-06-20 MED ORDER — TIOTROPIUM BROMIDE MONOHYDRATE 18 MCG IN CAPS: 18.0000 ug | ORAL_CAPSULE | Freq: Every day | RESPIRATORY_TRACT | 1 refills | Status: DC

## 2017-06-20 MED ORDER — ALBUTEROL SULFATE HFA 108 (90 BASE) MCG/ACT IN AERS: 2.0000 | INHALATION_SPRAY | RESPIRATORY_TRACT | 2 refills | Status: DC | PRN

## 2017-06-20 NOTE — Telephone Encounter (Signed)
Call placed to the patient to inquire about her request for prednisone. She stated that she has been taking prednisone 20 mg daily and is now out of the medication. She is concerned about having to return to the hospital if she does not receive the medication. Informed her that Dr Margarita Rana would be notified and instructed her to call Dr Annamaria Boots to schedule an appointment.  She said she has an appointment here at [facility] next week   - 06/25/17 @ 1430  and she will call Dr Annamaria Boots.   Dr Margarita Rana notified of the request for prednisone.    Call placed to the patient and informed her that the prednisone has been ordered. The prescription sent to her pharmacy.

## 2017-06-20 NOTE — Telephone Encounter (Signed)
Patient called the office requesting refills on her medication. Patient stated that she needs refill for PredniSONE (DELTASONE) 10 MG tablet and her inhaler before she ends up in the ED.

## 2017-06-20 NOTE — Telephone Encounter (Signed)
Refilled inhalers - needs PCP approval for prednisone

## 2017-06-20 NOTE — Telephone Encounter (Signed)
Done

## 2017-06-25 ENCOUNTER — Encounter: Payer: Self-pay | Admitting: Family Medicine

## 2017-06-25 ENCOUNTER — Ambulatory Visit: Payer: Medicaid Other | Attending: Family Medicine | Admitting: Family Medicine

## 2017-06-25 VITALS — BP 175/100 | HR 99 | Temp 98.4°F | Ht 66.0 in | Wt 353.6 lb

## 2017-06-25 DIAGNOSIS — F17200 Nicotine dependence, unspecified, uncomplicated: Secondary | ICD-10-CM | POA: Insufficient documentation

## 2017-06-25 DIAGNOSIS — G4700 Insomnia, unspecified: Secondary | ICD-10-CM | POA: Insufficient documentation

## 2017-06-25 DIAGNOSIS — G4733 Obstructive sleep apnea (adult) (pediatric): Secondary | ICD-10-CM | POA: Diagnosis not present

## 2017-06-25 DIAGNOSIS — M25561 Pain in right knee: Secondary | ICD-10-CM | POA: Diagnosis not present

## 2017-06-25 DIAGNOSIS — I10 Essential (primary) hypertension: Secondary | ICD-10-CM | POA: Diagnosis not present

## 2017-06-25 DIAGNOSIS — J449 Chronic obstructive pulmonary disease, unspecified: Secondary | ICD-10-CM | POA: Insufficient documentation

## 2017-06-25 DIAGNOSIS — Z7902 Long term (current) use of antithrombotics/antiplatelets: Secondary | ICD-10-CM | POA: Diagnosis not present

## 2017-06-25 DIAGNOSIS — M25562 Pain in left knee: Secondary | ICD-10-CM | POA: Insufficient documentation

## 2017-06-25 DIAGNOSIS — G8929 Other chronic pain: Secondary | ICD-10-CM

## 2017-06-25 DIAGNOSIS — F329 Major depressive disorder, single episode, unspecified: Secondary | ICD-10-CM | POA: Diagnosis not present

## 2017-06-25 DIAGNOSIS — Z6841 Body Mass Index (BMI) 40.0 and over, adult: Secondary | ICD-10-CM | POA: Insufficient documentation

## 2017-06-25 DIAGNOSIS — K219 Gastro-esophageal reflux disease without esophagitis: Secondary | ICD-10-CM | POA: Diagnosis not present

## 2017-06-25 DIAGNOSIS — Z8619 Personal history of other infectious and parasitic diseases: Secondary | ICD-10-CM | POA: Diagnosis not present

## 2017-06-25 DIAGNOSIS — Z79899 Other long term (current) drug therapy: Secondary | ICD-10-CM | POA: Insufficient documentation

## 2017-06-25 DIAGNOSIS — Z9889 Other specified postprocedural states: Secondary | ICD-10-CM | POA: Insufficient documentation

## 2017-06-25 DIAGNOSIS — R7303 Prediabetes: Secondary | ICD-10-CM | POA: Diagnosis not present

## 2017-06-25 DIAGNOSIS — F419 Anxiety disorder, unspecified: Secondary | ICD-10-CM | POA: Diagnosis not present

## 2017-06-25 DIAGNOSIS — Z9851 Tubal ligation status: Secondary | ICD-10-CM | POA: Insufficient documentation

## 2017-06-25 DIAGNOSIS — J4541 Moderate persistent asthma with (acute) exacerbation: Secondary | ICD-10-CM | POA: Insufficient documentation

## 2017-06-25 MED ORDER — IPRATROPIUM-ALBUTEROL 0.5-2.5 (3) MG/3ML IN SOLN: 3.0000 mL | Freq: Four times a day (QID) | RESPIRATORY_TRACT | Status: DC

## 2017-06-25 MED ORDER — PREDNISONE 20 MG PO TABS: 20.0000 mg | ORAL_TABLET | Freq: Every day | ORAL | 0 refills | Status: DC

## 2017-06-25 MED ORDER — DILTIAZEM HCL ER COATED BEADS 360 MG PO CP24
360.0000 mg | ORAL_CAPSULE | Freq: Every day | ORAL | 3 refills | Status: DC
Start: 2017-06-25 — End: 2017-08-18

## 2017-06-25 MED ORDER — IPRATROPIUM-ALBUTEROL 0.5-2.5 (3) MG/3ML IN SOLN
3.0000 mL | Freq: Once | RESPIRATORY_TRACT | Status: AC
  Administered 2017-06-25: 3 mL via RESPIRATORY_TRACT

## 2017-06-25 MED ORDER — METHYLPREDNISOLONE SODIUM SUCC 125 MG IJ SOLR
125.0000 mg | Freq: Once | INTRAMUSCULAR | Status: AC
  Administered 2017-06-25: 125 mg via INTRAMUSCULAR

## 2017-06-25 NOTE — Progress Notes (Signed)
Subjective:  Patient ID: April [name], female    DOB: 09/19/1970  Age: 47 y.o. MRN: [id number]  CC: Asthma   HPI April [name]  is a 47 year old female with a history of morbid obesity, major depressive disorder, hypertension, Pre Diabetes (A1c 5.6), Asthma (On Xolair injections every 2 weeks), obstructive sleep apnea (on CPAP) with multiple hospitalizations for asthma exacerbation here for a follow-up visit.  Her last hospitalization was at [facility] from 06/05/17 through 06/08/17 where she was hospitalized for asthma exacerbation and bilateral knee pain. Asthma was treated with nebulizer treatments and steroids as well as an antibiotic. Doppler of lower extremity was done due to bilateral knee pain and this was negative for DVT.  Analgesics were used to treat her knee pain and symptoms improved after which she was discharged on a prednisone taper.  She presents today stating she has completed her prednisone taper and is yet to follow-up with her pulmonologist-Dr. Annamaria Boots.  She complains of wheezing and shortness of breath and endorses being stressed as a result of chronic back and forth from the hospital as her mom is currently hospitalized in the ICU. She also suffers from bilateral knee pain and was informed by orthopedics a total knee replacement for the management of choice however her weight precludes this procedure.  She is also unhappy that she was kicked out of the bariatric surgery process due to her frequent hospitalization.  Past Medical History:  Diagnosis Date  . Acanthosis nigricans   . Anxiety   . Arthritis    "knees" (04/28/2017)  . Asthma    Followed by Dr. Annamaria Boots (pulmonology); receives every other week omalizumab injections; has frequent exacerbations  . COPD (chronic obstructive pulmonary disease) ([place])    PFTs in 2002, FEV1/FVC 65, no post bronchodilater test done  . Depression   . GERD (gastroesophageal reflux disease)   . Headache(784.0)    "q couple days"  (04/28/2017)  . Helicobacter pylori (H. pylori) infection   . Hypertension, essential   . Insomnia   . Menorrhagia   . Morbid obesity ([place])   . Obesity   . OSA on CPAP    Sleep study 2008 - mild OSA, not enough events to titrate CPAP; wears CPAP now/pt on 04/28/2017  . Pneumonia X 1  . Seasonal allergies   . Shortness of breath   . Tobacco user     Past Surgical History:  Procedure Laterality Date  . CARDIOVERSION N/A 05/30/2017   Procedure: CARDIOVERSION;  Surgeon: Sanda Klein, MD;  Location: [place] [facility];  Service: Cardiovascular;  Laterality: N/A;  . REDUCTION MAMMAPLASTY Bilateral 09/2011  . TUBAL LIGATION  1996   bilateral    No Known Allergies   Outpatient Medications Prior to Visit  Medication Sig Dispense Refill  . albuterol (PROVENTIL HFA;VENTOLIN HFA) 108 (90 Base) MCG/ACT inhaler Inhale 2 puffs into the lungs every 4 (four) hours as needed for wheezing or shortness of breath. 1 Inhaler 2  . apixaban (ELIQUIS) 5 MG TABS tablet Take 1 tablet (5 mg total) by mouth 2 (two) times daily. 60 tablet 1  . arformoterol (BROVANA) 15 MCG/2ML NEBU Take 2 mLs (15 mcg total) by nebulization 2 (two) times daily. 120 mL 1  . budesonide (PULMICORT) 0.25 MG/2ML nebulizer solution Take 2 mLs (0.25 mg total) by nebulization 2 (two) times daily. 60 mL 0  . busPIRone (BUSPAR) 7.5 MG tablet Take 1 tablet (7.5 mg total) 2 (two) times daily by mouth. 60 tablet 3  .  butalbital-acetaminophen-caffeine (FIORICET, ESGIC) 50-325-40 MG tablet Take 1-2 tablets by mouth every 6 (six) hours as needed for headache. 20 tablet 0  . famotidine (PEPCID) 20 MG tablet Take 1 tablet (20 mg total) by mouth 2 (two) times daily. 30 tablet 0  . fluticasone (FLONASE) 50 MCG/ACT nasal spray Place 2 sprays into both nostrils daily. 16 g 6  . furosemide (LASIX) 40 MG tablet Take 1 tablet (40 mg total) daily by mouth. 30 tablet 3  . guaiFENesin (MUCINEX) 600 MG 12 hr tablet Take 1 tablet (600 mg total) by mouth 2  (two) times daily. 30 tablet 0  . levalbuterol (XOPENEX) 0.63 MG/3ML nebulizer solution Take 3 mLs (0.63 mg total) by nebulization every 6 (six) hours. 3 mL 12  . loratadine (CLARITIN) 10 MG tablet Take 1 tablet (10 mg total) by mouth daily. 90 tablet 3  . losartan (COZAAR) 100 MG tablet Take 1 tablet (100 mg total) daily by mouth. 30 tablet 3  . metoprolol tartrate (LOPRESSOR) 100 MG tablet Take 1 tablet (100 mg total) by mouth 2 (two) times daily. 60 tablet 3  . mometasone-formoterol (DULERA) 200-5 MCG/ACT AERO Inhale 2 puffs into the lungs 2 (two) times daily. 13 g 2  . montelukast (SINGULAIR) 10 MG tablet Take 1 tablet (10 mg total) at bedtime by mouth. 30 tablet 3  . nicotine (NICODERM CQ - DOSED IN MG/24 HOURS) 14 mg/24hr patch Place 1 patch (14 mg total) onto the skin daily. 28 patch 0  . omeprazole (PRILOSEC) 20 MG capsule Take 1 capsule (20 mg total) by mouth 2 (two) times daily.    . sertraline (ZOLOFT) 100 MG tablet Take 1 tablet (100 mg total) daily by mouth. 60 tablet 3  . spironolactone (ALDACTONE) 100 MG tablet Take 1 tablet (100 mg total) daily by mouth. 30 tablet 3  . tiotropium (SPIRIVA HANDIHALER) 18 MCG inhalation capsule Place 1 capsule (18 mcg total) into inhaler and inhale daily. 30 capsule 1  . Vitamin D, Ergocalciferol, (DRISDOL) 50000 units CAPS capsule Take 1 capsule (50,000 Units total) by mouth every 7 (seven) days. 6 capsule 0  . zolpidem (AMBIEN) 10 MG tablet Take 10 mg by mouth at bedtime.    [name] diltiazem (CARDIZEM CD) 240 MG 24 hr capsule Take 1 capsule (240 mg total) by mouth daily. 30 capsule 1  . predniSONE (DELTASONE) 20 MG tablet Take 1 tablet (20 mg total) by mouth daily with breakfast. (Patient not taking: Reported on 06/25/2017) 10 tablet 0   No facility-administered medications prior to visit.     ROS Review of Systems  Constitutional: Negative for activity change, appetite change and fatigue.  HENT: Negative for congestion, sinus pressure and sore  throat.   Eyes: Negative for visual disturbance.  Respiratory: Positive for cough, shortness of breath and wheezing. Negative for chest tightness.   Cardiovascular: Negative for chest pain and palpitations.  Gastrointestinal: Negative for abdominal distention, abdominal pain and constipation.  Endocrine: Negative for polydipsia.  Genitourinary: Negative for dysuria and frequency.  Musculoskeletal:       See hpi  Skin: Negative for rash.  Neurological: Negative for tremors, light-headedness and numbness.  Hematological: Does not bruise/bleed easily.  Psychiatric/Behavioral: Negative for agitation and behavioral problems.    Objective:  BP (!) 175/100   Pulse 99   Temp 98.4 F (36.9 C) (Oral)   Ht 5\' 6"  (1.676 m)   Wt (!) 353 lb 9.6 oz (160.4 kg)   SpO2 99%   BMI 57.07 kg/m  BP/Weight 06/25/2017 2/29/7989 05/31/1939  Systolic BP 740 814 -  Diastolic BP 481 89 -  Wt. (Lbs) 353.6 - 347.89  BMI 57.07 - 56.15  Some encounter information is confidential and restricted. Go to Review Flowsheets activity to see all data.      Physical Exam Constitutional: normal appearing,  Eyes: PERRLA HEENT: Head is atraumatic, normal sinuses, normal oropharynx, normal appearing tonsils and palate, tympanic membrane is normal bilaterally. Neck: normal range of motion, no thyromegaly, no JVD Cardiovascular: normal rate and rhythm, normal heart sounds, no murmurs, rub or gallop, no pedal edema Respiratory: bilateral expiratory wheezing Abdomen: soft, not tender to palpation, normal bowel sounds, no enlarged organs Extremities: Tenderness on range motion of both knees Skin: warm and dry, no lesions. Neurological: alert, oriented x3, cranial nerves I-XII grossly intact , normal motor strength, normal sensation. Psychological: normal mood.     Assessment & Plan:   1. Moderate persistent asthma with exacerbation Uncontrolled with frequent exacerbations and hospitalizations for the  same Solu-Medrol 125 mg administered as well as and nebulizer treatments We will refill prednisone which she will commence tomorrow Advised to schedule an appointment with her pulmonologist-Dr. Annamaria Boots - ipratropium-albuterol (DUONEB) 0.5-2.5 (3) MG/3ML nebulizer solution 3 mL - methylPREDNISolone sodium succinate (SOLU-MEDROL) 125 mg/2 mL injection 125 mg - predniSONE (DELTASONE) 20 MG tablet; Take 1 tablet (20 mg total) by mouth daily with breakfast.  Dispense: 30 tablet; Refill: 0  2. Mild obstructive sleep apnea Continue with CPAP at night  3. Essential hypertension Uncontrolled Currently stressed from running back and forth from the hospital due to her mom being admitted Will increase dose of Cardizem Low-sodium, DASH diet as well as weight loss - diltiazem (CARDIZEM CD) 360 MG 24 hr capsule; Take 1 capsule (360 mg total) by mouth daily.  Dispense: 30 capsule; Refill: 3  4. Chronic pain of both knees Uncontrolled Seen by orthopedics and total knee replacement recommended after she has lost weight.   Meds ordered this encounter  Medications  . ipratropium-albuterol (DUONEB) 0.5-2.5 (3) MG/3ML nebulizer solution 3 mL  . methylPREDNISolone sodium succinate (SOLU-MEDROL) 125 mg/2 mL injection 125 mg  . predniSONE (DELTASONE) 20 MG tablet    Sig: Take 1 tablet (20 mg total) by mouth daily with breakfast.    Dispense:  30 tablet    Refill:  0  . diltiazem (CARDIZEM CD) 360 MG 24 hr capsule    Sig: Take 1 capsule (360 mg total) by mouth daily.    Dispense:  30 capsule    Refill:  3    Discontinue previous dose    Follow-up: Return in about 3 weeks (around 07/16/2017) for follow up on Hypertension and Asthma.   [name] MD

## 2017-06-25 NOTE — Patient Instructions (Signed)

## 2017-07-03 ENCOUNTER — Telehealth: Payer: Self-pay | Admitting: Family Medicine

## 2017-07-03 NOTE — Telephone Encounter (Signed)
Patient called a requested for you to call them back

## 2017-07-04 ENCOUNTER — Telehealth: Payer: Self-pay

## 2017-07-04 ENCOUNTER — Ambulatory Visit: Payer: Medicaid Other | Admitting: Cardiology

## 2017-07-04 DIAGNOSIS — J449 Chronic obstructive pulmonary disease, unspecified: Secondary | ICD-10-CM

## 2017-07-04 MED ORDER — BUDESONIDE 0.25 MG/2ML IN SUSP: 0.2500 mg | Freq: Two times a day (BID) | RESPIRATORY_TRACT | 2 refills | Status: DC

## 2017-07-04 NOTE — Telephone Encounter (Addendum)
Message received from the patient requesting a call back.    She explained that she is still waiting for a decision on her disability application and was told that she should hear a determination in about 2 weeks.   She stated that she has been wheezing more the past few days  and is trying to stay out of the hospital.  She was not short of breath while talking on the phone. She stated that she has been taking prednisone 40 mg daily since her appointment with Dr Margarita Rana on 06/25/17 and she thinks that maybe she should be taking more that. The prednisone was ordered for 20 mg daily.    She has been using the brovanna and pulmicort twice a day as prescribed and has been using the xopenex every 2-4 hours.    She is requesting a refill of the pulmicort and would like it sent to [facility] that she has been using.    Reminded her to call Dr Annamaria Boots to schedule a follow up appointment. She stated that she had lost his number. This CM provided her with the phone # and encouraged her to call.    As this note is being completed, she has called Dr Janee Morn office and has an appointment scheduled for 07/07/2017.

## 2017-07-04 NOTE — Telephone Encounter (Signed)
Pulmicort refilled.

## 2017-07-07 ENCOUNTER — Encounter (HOSPITAL_COMMUNITY): Payer: Self-pay | Admitting: Emergency Medicine

## 2017-07-07 ENCOUNTER — Inpatient Hospital Stay (HOSPITAL_COMMUNITY)
Admission: EM | Admit: 2017-07-07 | Discharge: 2017-07-12 | DRG: 189 | Disposition: A | Discharge status: Home / Self Care | Payer: Medicaid Other | Attending: Family Medicine | Admitting: Family Medicine

## 2017-07-07 ENCOUNTER — Telehealth: Payer: Self-pay | Admitting: Internal Medicine

## 2017-07-07 ENCOUNTER — Emergency Department (HOSPITAL_COMMUNITY): Payer: Medicaid Other

## 2017-07-07 ENCOUNTER — Ambulatory Visit: Payer: Medicaid Other | Admitting: Adult Health

## 2017-07-07 ENCOUNTER — Other Ambulatory Visit: Payer: Self-pay

## 2017-07-07 DIAGNOSIS — T380X5A Adverse effect of glucocorticoids and synthetic analogues, initial encounter: Secondary | ICD-10-CM | POA: Diagnosis present

## 2017-07-07 DIAGNOSIS — D649 Anemia, unspecified: Secondary | ICD-10-CM | POA: Diagnosis present

## 2017-07-07 DIAGNOSIS — F5101 Primary insomnia: Secondary | ICD-10-CM | POA: Diagnosis present

## 2017-07-07 DIAGNOSIS — I158 Other secondary hypertension: Secondary | ICD-10-CM | POA: Diagnosis present

## 2017-07-07 DIAGNOSIS — K219 Gastro-esophageal reflux disease without esophagitis: Secondary | ICD-10-CM | POA: Diagnosis present

## 2017-07-07 DIAGNOSIS — Z79899 Other long term (current) drug therapy: Secondary | ICD-10-CM

## 2017-07-07 DIAGNOSIS — Z720 Tobacco use: Secondary | ICD-10-CM | POA: Diagnosis present

## 2017-07-07 DIAGNOSIS — F329 Major depressive disorder, single episode, unspecified: Secondary | ICD-10-CM | POA: Diagnosis present

## 2017-07-07 DIAGNOSIS — Z7901 Long term (current) use of anticoagulants: Secondary | ICD-10-CM

## 2017-07-07 DIAGNOSIS — M25562 Pain in left knee: Secondary | ICD-10-CM

## 2017-07-07 DIAGNOSIS — D509 Iron deficiency anemia, unspecified: Secondary | ICD-10-CM | POA: Diagnosis present

## 2017-07-07 DIAGNOSIS — F32A Depression, unspecified: Secondary | ICD-10-CM | POA: Diagnosis present

## 2017-07-07 DIAGNOSIS — J9601 Acute respiratory failure with hypoxia: Principal | ICD-10-CM | POA: Diagnosis present

## 2017-07-07 DIAGNOSIS — F419 Anxiety disorder, unspecified: Secondary | ICD-10-CM | POA: Diagnosis present

## 2017-07-07 DIAGNOSIS — Z87891 Personal history of nicotine dependence: Secondary | ICD-10-CM

## 2017-07-07 DIAGNOSIS — I10 Essential (primary) hypertension: Secondary | ICD-10-CM | POA: Diagnosis present

## 2017-07-07 DIAGNOSIS — D72829 Elevated white blood cell count, unspecified: Secondary | ICD-10-CM | POA: Diagnosis present

## 2017-07-07 DIAGNOSIS — I480 Paroxysmal atrial fibrillation: Secondary | ICD-10-CM | POA: Diagnosis present

## 2017-07-07 DIAGNOSIS — R7303 Prediabetes: Secondary | ICD-10-CM | POA: Diagnosis present

## 2017-07-07 DIAGNOSIS — J449 Chronic obstructive pulmonary disease, unspecified: Secondary | ICD-10-CM

## 2017-07-07 DIAGNOSIS — E2609 Other primary hyperaldosteronism: Secondary | ICD-10-CM | POA: Diagnosis present

## 2017-07-07 DIAGNOSIS — I152 Hypertension secondary to endocrine disorders: Secondary | ICD-10-CM

## 2017-07-07 DIAGNOSIS — I5032 Chronic diastolic (congestive) heart failure: Secondary | ICD-10-CM | POA: Diagnosis present

## 2017-07-07 DIAGNOSIS — M25561 Pain in right knee: Secondary | ICD-10-CM | POA: Diagnosis present

## 2017-07-07 DIAGNOSIS — Z9989 Dependence on other enabling machines and devices: Secondary | ICD-10-CM

## 2017-07-07 DIAGNOSIS — J441 Chronic obstructive pulmonary disease with (acute) exacerbation: Secondary | ICD-10-CM | POA: Diagnosis present

## 2017-07-07 DIAGNOSIS — Z6841 Body Mass Index (BMI) 40.0 and over, adult: Secondary | ICD-10-CM

## 2017-07-07 DIAGNOSIS — R739 Hyperglycemia, unspecified: Secondary | ICD-10-CM | POA: Diagnosis present

## 2017-07-07 DIAGNOSIS — Z7952 Long term (current) use of systemic steroids: Secondary | ICD-10-CM

## 2017-07-07 DIAGNOSIS — T50905A Adverse effect of unspecified drugs, medicaments and biological substances, initial encounter: Secondary | ICD-10-CM

## 2017-07-07 DIAGNOSIS — J302 Other seasonal allergic rhinitis: Secondary | ICD-10-CM | POA: Diagnosis present

## 2017-07-07 DIAGNOSIS — I4891 Unspecified atrial fibrillation: Secondary | ICD-10-CM | POA: Diagnosis present

## 2017-07-07 DIAGNOSIS — M5090 Cervical disc disorder, unspecified, unspecified cervical region: Secondary | ICD-10-CM | POA: Diagnosis present

## 2017-07-07 DIAGNOSIS — G4733 Obstructive sleep apnea (adult) (pediatric): Secondary | ICD-10-CM

## 2017-07-07 DIAGNOSIS — I160 Hypertensive urgency: Secondary | ICD-10-CM | POA: Diagnosis present

## 2017-07-07 DIAGNOSIS — E099 Drug or chemical induced diabetes mellitus without complications: Secondary | ICD-10-CM | POA: Diagnosis present

## 2017-07-07 DIAGNOSIS — J4541 Moderate persistent asthma with (acute) exacerbation: Secondary | ICD-10-CM

## 2017-07-07 DIAGNOSIS — I110 Hypertensive heart disease with heart failure: Secondary | ICD-10-CM | POA: Diagnosis present

## 2017-07-07 DIAGNOSIS — G4700 Insomnia, unspecified: Secondary | ICD-10-CM | POA: Diagnosis present

## 2017-07-07 LAB — CBC WITH DIFFERENTIAL/PLATELET
Band Neutrophils: 0 %
Basophils Absolute: 0 10*3/uL (ref 0.0–0.1)
Basophils Relative: 0 %
Blasts: 0 %
Eosinophils Absolute: 0 10*3/uL (ref 0.0–0.7)
Eosinophils Relative: 0 %
HCT: 40 % (ref 36.0–46.0)
Hemoglobin: 12.3 g/dL (ref 12.0–15.0)
LYMPHS ABS: 3.2 10*3/uL (ref 0.7–4.0)
Lymphocytes Relative: 14 %
MCH: 27.8 pg (ref 26.0–34.0)
MCHC: 30.8 g/dL (ref 30.0–36.0)
MCV: 90.3 fL (ref 78.0–100.0)
MONO ABS: 1.6 10*3/uL — AB (ref 0.1–1.0)
MONOS PCT: 7 %
Metamyelocytes Relative: 0 %
Myelocytes: 0 %
NEUTROS ABS: 18.4 10*3/uL — AB (ref 1.7–7.7)
NEUTROS PCT: 79 %
NRBC: 0 /100{WBCs}
PLATELETS: 402 10*3/uL — AB (ref 150–400)
Promyelocytes Absolute: 0 %
RBC: 4.43 MIL/uL (ref 3.87–5.11)
RDW: 18.4 % — AB (ref 11.5–15.5)
WBC: 23.2 10*3/uL — AB (ref 4.0–10.5)

## 2017-07-07 LAB — I-STAT TROPONIN, ED
TROPONIN I, POC: 0 ng/mL (ref 0.00–0.08)
Troponin i, poc: 0.01 ng/mL (ref 0.00–0.08)

## 2017-07-07 LAB — BASIC METABOLIC PANEL
Anion gap: 12 (ref 5–15)
BUN: 12 mg/dL (ref 6–20)
CALCIUM: 8.4 mg/dL — AB (ref 8.9–10.3)
CO2: 28 mmol/L (ref 22–32)
Chloride: 99 mmol/L — ABNORMAL LOW (ref 101–111)
Creatinine, Ser: 0.9 mg/dL (ref 0.44–1.00)
GFR calc Af Amer: >60 mL/min (ref >60)
GLUCOSE: 132 mg/dL — AB (ref 65–99)
Potassium: 3.5 mmol/L (ref 3.5–5.1)
Sodium: 139 mmol/L (ref 135–145)

## 2017-07-07 LAB — CBG MONITORING, ED
GLUCOSE-CAPILLARY: 284 mg/dL — AB (ref 65–99)
GLUCOSE-CAPILLARY: 280 mg/dL — AB (ref 65–99)
Glucose-Capillary: 293 mg/dL — ABNORMAL HIGH (ref 65–99)

## 2017-07-07 LAB — HEMOGLOBIN A1C
Hgb A1c MFr Bld: 7.2 % — ABNORMAL HIGH (ref 4.8–5.6)
MEAN PLASMA GLUCOSE: 159.94 mg/dL

## 2017-07-07 LAB — GLUCOSE, CAPILLARY: Glucose-Capillary: 281 mg/dL — ABNORMAL HIGH (ref 65–99)

## 2017-07-07 LAB — LACTIC ACID, PLASMA: LACTIC ACID, VENOUS: 2.8 mmol/L — AB (ref 0.5–1.9)

## 2017-07-07 LAB — PROCALCITONIN: Procalcitonin: <0.1 ng/mL

## 2017-07-07 IMAGING — DX DG CHEST 1V PORT
1 series · 1 of 1 positions shown · non-contrast
Comparison: 05/09/2016.

CLINICAL DATA: Shortness of breath .

EXAM:
PORTABLE CHEST 1 VIEW

[chest ap]
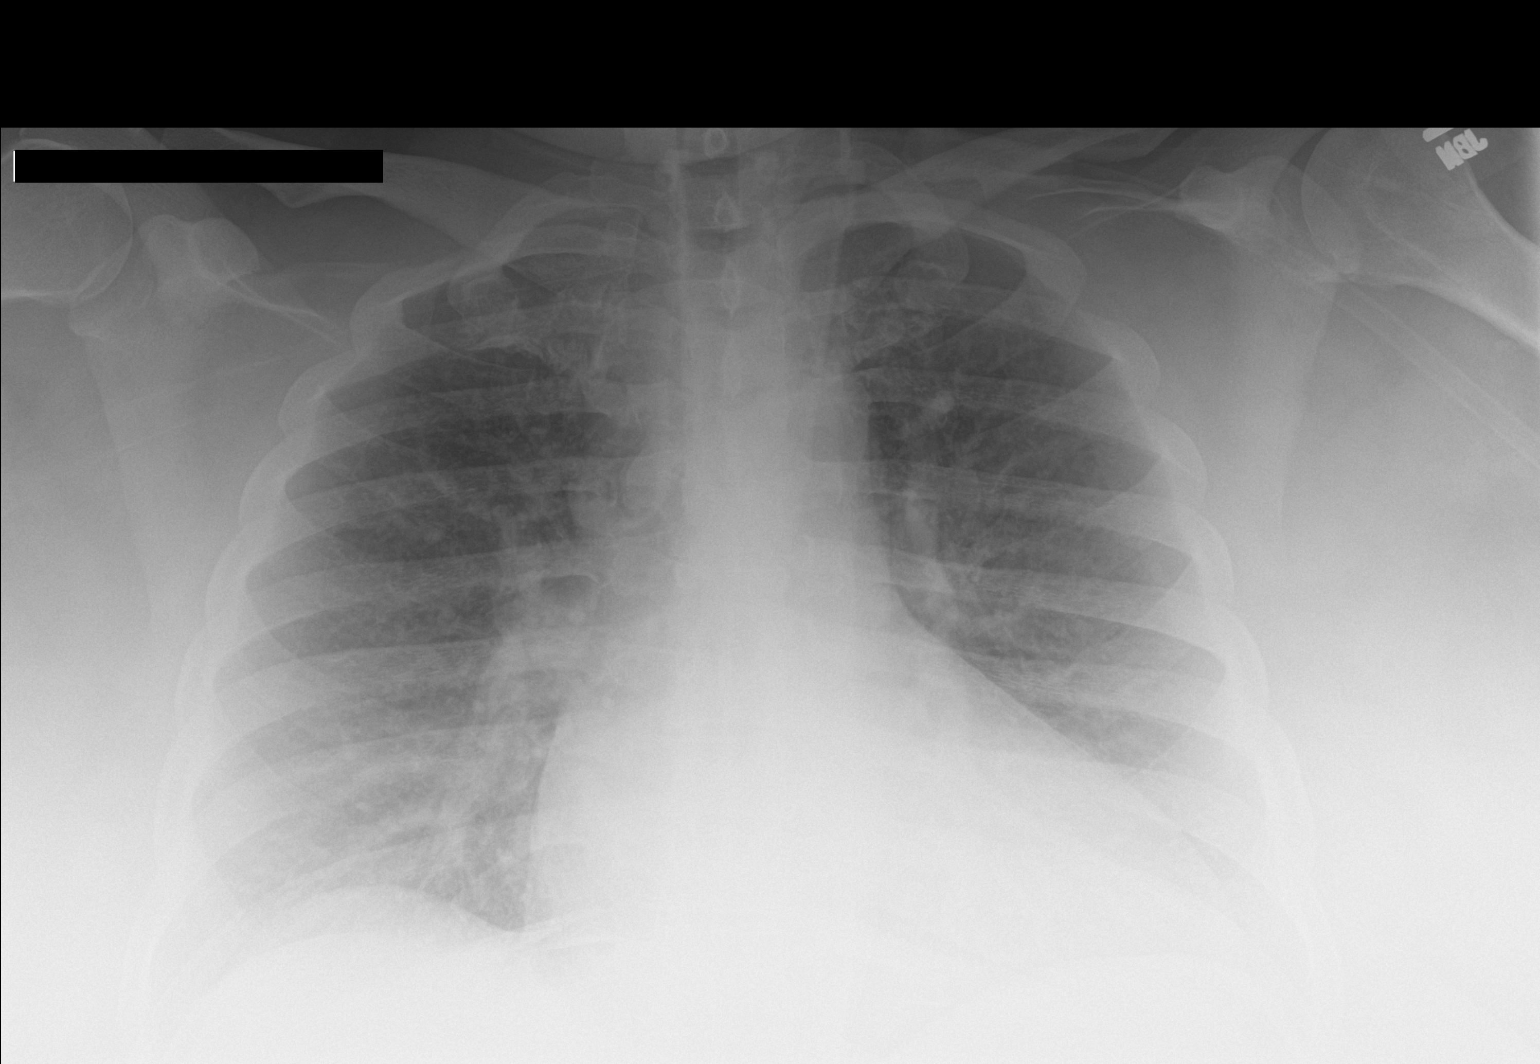

[1 of 1 positions shown; findings below may reference images not displayed]

FINDINGS: Cardiomegaly with mild bilateral interstitial prominence suggesting
mild CHF. Small bilateral pleural effusions cannot be excluded. No
pneumothorax .
IMPRESSION: Cardiomegaly with very mild interstitial prominence suggesting mild
interstitial edema. Small bilateral pleural effusions cannot be
excluded .

## 2017-07-07 MED ORDER — INSULIN ASPART 100 UNIT/ML ~~LOC~~ SOLN
0.0000 [IU] | Freq: Three times a day (TID) | SUBCUTANEOUS | Status: DC
  Administered 2017-07-07: 5 [IU] via SUBCUTANEOUS
  Filled 2017-07-07: qty 1

## 2017-07-07 MED ORDER — HYDRALAZINE HCL 20 MG/ML IJ SOLN: 5.0000 mg | Freq: Three times a day (TID) | INTRAMUSCULAR | Status: DC | PRN

## 2017-07-07 MED ORDER — ONDANSETRON HCL 4 MG/2ML IJ SOLN: 4.0000 mg | Freq: Four times a day (QID) | INTRAMUSCULAR | Status: DC | PRN

## 2017-07-07 MED ORDER — BUTALBITAL-APAP-CAFFEINE 50-325-40 MG PO TABS
1.0000 | ORAL_TABLET | Freq: Four times a day (QID) | ORAL | Status: DC | PRN
Start: 2017-07-07 — End: 2017-07-12

## 2017-07-07 MED ORDER — SODIUM CHLORIDE 0.9 % IV SOLN
100.0000 mg | Freq: Two times a day (BID) | INTRAVENOUS | Status: DC
  Administered 2017-07-07 – 2017-07-08 (×3): 100 mg via INTRAVENOUS
  Filled 2017-07-07 (×5): qty 100

## 2017-07-07 MED ORDER — LORATADINE 10 MG PO TABS
10.0000 mg | ORAL_TABLET | Freq: Every day | ORAL | Status: DC
  Administered 2017-07-07 – 2017-07-12 (×6): 10 mg via ORAL
  Filled 2017-07-07 (×6): qty 1

## 2017-07-07 MED ORDER — ONDANSETRON HCL 4 MG PO TABS: 4.0000 mg | ORAL_TABLET | Freq: Four times a day (QID) | ORAL | Status: DC | PRN

## 2017-07-07 MED ORDER — SPIRONOLACTONE 25 MG PO TABS
100.0000 mg | ORAL_TABLET | Freq: Every day | ORAL | Status: DC
  Administered 2017-07-07 – 2017-07-12 (×6): 100 mg via ORAL
  Filled 2017-07-07 (×4): qty 4
  Filled 2017-07-07: qty 1
  Filled 2017-07-07: qty 4

## 2017-07-07 MED ORDER — ACETAMINOPHEN 325 MG PO TABS
650.0000 mg | ORAL_TABLET | Freq: Four times a day (QID) | ORAL | Status: DC | PRN
  Administered 2017-07-07 – 2017-07-09 (×5): 650 mg via ORAL
  Filled 2017-07-07 (×6): qty 2

## 2017-07-07 MED ORDER — GUAIFENESIN ER 600 MG PO TB12
600.0000 mg | ORAL_TABLET | Freq: Two times a day (BID) | ORAL | Status: DC
  Administered 2017-07-07 – 2017-07-12 (×11): 600 mg via ORAL
  Filled 2017-07-07 (×11): qty 1

## 2017-07-07 MED ORDER — APIXABAN 5 MG PO TABS
5.0000 mg | ORAL_TABLET | Freq: Two times a day (BID) | ORAL | Status: DC
  Administered 2017-07-07 – 2017-07-12 (×11): 5 mg via ORAL
  Filled 2017-07-07 (×13): qty 1

## 2017-07-07 MED ORDER — METOPROLOL TARTRATE 100 MG PO TABS
100.0000 mg | ORAL_TABLET | Freq: Two times a day (BID) | ORAL | Status: DC
  Administered 2017-07-07 – 2017-07-12 (×11): 100 mg via ORAL
  Filled 2017-07-07 (×9): qty 1
  Filled 2017-07-07: qty 4
  Filled 2017-07-07: qty 1

## 2017-07-07 MED ORDER — FLUTICASONE PROPIONATE 50 MCG/ACT NA SUSP
2.0000 | Freq: Every day | NASAL | Status: DC
Start: 2017-07-07 — End: 2017-07-12
  Administered 2017-07-07 – 2017-07-12 (×6): 2 via NASAL
  Filled 2017-07-07 (×2): qty 16

## 2017-07-07 MED ORDER — SERTRALINE HCL 100 MG PO TABS
100.0000 mg | ORAL_TABLET | Freq: Every day | ORAL | Status: DC
  Administered 2017-07-07 – 2017-07-12 (×6): 100 mg via ORAL
  Filled 2017-07-07 (×6): qty 1

## 2017-07-07 MED ORDER — KETOROLAC TROMETHAMINE 15 MG/ML IJ SOLN
15.0000 mg | Freq: Four times a day (QID) | INTRAMUSCULAR | Status: AC | PRN
Start: 2017-07-07 — End: 2017-07-12
  Administered 2017-07-08 – 2017-07-10 (×5): 15 mg via INTRAVENOUS
  Filled 2017-07-07 (×5): qty 1

## 2017-07-07 MED ORDER — ALBUTEROL SULFATE (2.5 MG/3ML) 0.083% IN NEBU
5.0000 mg | INHALATION_SOLUTION | Freq: Once | RESPIRATORY_TRACT | Status: AC
  Administered 2017-07-07: 5 mg via RESPIRATORY_TRACT
  Filled 2017-07-07: qty 6

## 2017-07-07 MED ORDER — DILTIAZEM HCL ER COATED BEADS 180 MG PO CP24
360.0000 mg | ORAL_CAPSULE | Freq: Every day | ORAL | Status: DC
  Administered 2017-07-07 – 2017-07-12 (×6): 360 mg via ORAL
  Filled 2017-07-07 (×4): qty 2
  Filled 2017-07-07: qty 1
  Filled 2017-07-07: qty 2

## 2017-07-07 MED ORDER — LOSARTAN POTASSIUM 50 MG PO TABS
100.0000 mg | ORAL_TABLET | Freq: Every day | ORAL | Status: DC
  Administered 2017-07-07 – 2017-07-12 (×6): 100 mg via ORAL
  Filled 2017-07-07 (×6): qty 2

## 2017-07-07 MED ORDER — MAGNESIUM SULFATE 2 GM/50ML IV SOLN
2.0000 g | Freq: Once | INTRAVENOUS | Status: AC
  Administered 2017-07-07: 2 g via INTRAVENOUS
  Filled 2017-07-07: qty 50

## 2017-07-07 MED ORDER — METHYLPREDNISOLONE SODIUM SUCC 125 MG IJ SOLR
60.0000 mg | Freq: Three times a day (TID) | INTRAMUSCULAR | Status: DC
  Administered 2017-07-07 – 2017-07-09 (×6): 60 mg via INTRAVENOUS
  Filled 2017-07-07 (×6): qty 2

## 2017-07-07 MED ORDER — PANTOPRAZOLE SODIUM 40 MG IV SOLR
40.0000 mg | INTRAVENOUS | Status: DC
  Administered 2017-07-07 – 2017-07-08 (×2): 40 mg via INTRAVENOUS
  Filled 2017-07-07 (×2): qty 40

## 2017-07-07 MED ORDER — LEVOFLOXACIN IN D5W 750 MG/150ML IV SOLN
750.0000 mg | INTRAVENOUS | Status: DC
Start: 2017-07-07 — End: 2017-07-07

## 2017-07-07 MED ORDER — NICOTINE 14 MG/24HR TD PT24
14.0000 mg | MEDICATED_PATCH | Freq: Every day | TRANSDERMAL | Status: DC
  Administered 2017-07-08 – 2017-07-12 (×6): 14 mg via TRANSDERMAL
  Filled 2017-07-07 (×7): qty 1

## 2017-07-07 MED ORDER — HYDROCODONE-ACETAMINOPHEN 5-325 MG PO TABS
1.0000 | ORAL_TABLET | ORAL | Status: DC | PRN
  Administered 2017-07-10 – 2017-07-12 (×6): 2 via ORAL
  Administered 2017-07-12: 1 via ORAL
  Filled 2017-07-07 (×6): qty 2
  Filled 2017-07-07: qty 1

## 2017-07-07 MED ORDER — BISACODYL 10 MG RE SUPP: 10.0000 mg | Freq: Every day | RECTAL | Status: DC | PRN

## 2017-07-07 MED ORDER — MONTELUKAST SODIUM 10 MG PO TABS
10.0000 mg | ORAL_TABLET | Freq: Every day | ORAL | Status: DC
  Administered 2017-07-07 – 2017-07-11 (×5): 10 mg via ORAL
  Filled 2017-07-07 (×6): qty 1

## 2017-07-07 MED ORDER — ALBUTEROL SULFATE (2.5 MG/3ML) 0.083% IN NEBU
5.0000 mg | INHALATION_SOLUTION | RESPIRATORY_TRACT | Status: DC | PRN
  Administered 2017-07-11: 5 mg via RESPIRATORY_TRACT
  Administered 2017-07-12: 2.5 mg via RESPIRATORY_TRACT
  Filled 2017-07-07 (×5): qty 6

## 2017-07-07 MED ORDER — ALBUTEROL (5 MG/ML) CONTINUOUS INHALATION SOLN
10.0000 mg/h | INHALATION_SOLUTION | RESPIRATORY_TRACT | Status: DC
  Administered 2017-07-07: 10 mg/h via RESPIRATORY_TRACT
  Filled 2017-07-07: qty 20

## 2017-07-07 MED ORDER — IPRATROPIUM-ALBUTEROL 0.5-2.5 (3) MG/3ML IN SOLN
3.0000 mL | RESPIRATORY_TRACT | Status: DC
  Administered 2017-07-07 – 2017-07-10 (×18): 3 mL via RESPIRATORY_TRACT
  Filled 2017-07-07 (×19): qty 3

## 2017-07-07 MED ORDER — ZOLPIDEM TARTRATE 5 MG PO TABS
5.0000 mg | ORAL_TABLET | Freq: Every day | ORAL | Status: DC
  Administered 2017-07-07 – 2017-07-11 (×5): 5 mg via ORAL
  Filled 2017-07-07 (×5): qty 1

## 2017-07-07 MED ORDER — METHYLPREDNISOLONE SODIUM SUCC 125 MG IJ SOLR
125.0000 mg | Freq: Once | INTRAMUSCULAR | Status: AC
  Administered 2017-07-07: 125 mg via INTRAVENOUS
  Filled 2017-07-07: qty 2

## 2017-07-07 MED ORDER — ACETAMINOPHEN 650 MG RE SUPP: 650.0000 mg | Freq: Four times a day (QID) | RECTAL | Status: DC | PRN

## 2017-07-07 MED ORDER — INSULIN ASPART 100 UNIT/ML ~~LOC~~ SOLN
0.0000 [IU] | Freq: Three times a day (TID) | SUBCUTANEOUS | Status: DC
  Administered 2017-07-07: 11 [IU] via SUBCUTANEOUS
  Administered 2017-07-08: 20 [IU] via SUBCUTANEOUS
  Administered 2017-07-08: 3 [IU] via SUBCUTANEOUS
  Administered 2017-07-09: 4 [IU] via SUBCUTANEOUS
  Administered 2017-07-09 – 2017-07-10 (×3): 15 [IU] via SUBCUTANEOUS
  Administered 2017-07-10 (×2): 7 [IU] via SUBCUTANEOUS
  Administered 2017-07-11 (×2): 4 [IU] via SUBCUTANEOUS
  Administered 2017-07-11: 7 [IU] via SUBCUTANEOUS
  Administered 2017-07-12: 4 [IU] via SUBCUTANEOUS
  Administered 2017-07-12: 3 [IU] via SUBCUTANEOUS
  Filled 2017-07-07: qty 1

## 2017-07-07 MED ORDER — BUSPIRONE HCL 5 MG PO TABS
7.5000 mg | ORAL_TABLET | Freq: Two times a day (BID) | ORAL | Status: DC
  Administered 2017-07-07 – 2017-07-12 (×11): 7.5 mg via ORAL
  Filled 2017-07-07 (×2): qty 2
  Filled 2017-07-07: qty 1
  Filled 2017-07-07 (×2): qty 2
  Filled 2017-07-07: qty 1
  Filled 2017-07-07 (×6): qty 2

## 2017-07-07 MED ORDER — FUROSEMIDE 40 MG PO TABS
40.0000 mg | ORAL_TABLET | Freq: Every day | ORAL | Status: DC
  Administered 2017-07-07 – 2017-07-12 (×6): 40 mg via ORAL
  Filled 2017-07-07 (×5): qty 1
  Filled 2017-07-07: qty 2

## 2017-07-07 MED ORDER — SENNOSIDES-DOCUSATE SODIUM 8.6-50 MG PO TABS
1.0000 | ORAL_TABLET | Freq: Every evening | ORAL | Status: DC | PRN
  Administered 2017-07-11: 1 via ORAL
  Filled 2017-07-07: qty 1

## 2017-07-07 NOTE — ED Notes (Signed)
Notified pharmacy for medications 

## 2017-07-07 NOTE — Telephone Encounter (Signed)
Called patient, unable to reach on phone numbers provided including emergency contact. Per Epic patient is currently in the hospital. Left message for patient to give [place] a call back. In the chart it shows that both the [facility] and the Albuterol (rescue inhaler) were called in on 2.22.19.  Will await patients call back.

## 2017-07-07 NOTE — ED Notes (Signed)
Heart healthy/carb modified lunch tray ordered 

## 2017-07-07 NOTE — H&P (Addendum)
History and Physical    April [name] GDJ:[id number] DOB: Jan 28, 1971 DOA: 07/07/2017   PCP: [name], MD   Patient coming from:  Home    Chief Complaint: Shortness of breath and wheezing   HPI: April [name] is a 47 y.o. female with medical history significant for morbid obesity, anxiety, major depressive disorder, hypertension, prediabetes,OSA on CPAP, asthma with multiple hospitalizations for asthma on Xolair injections every 2 weeks, on chronic prednisone 20 mg daily, with multiple ED visits for asthma exacerbation in the past, presenting to the ED with increasing shortness of breath and wheezing for the last 3 days. Patient reported dry cough, and generalized weakness, subjective fever without chills.  Denies any productive cough, or hemoptysis.  The patient reports sick contact in her mother, who was "fighting a viral infection "patient had been on nebulizers about every hour for the past day, without any relief and had been using rescue inhalers increasing her chronic prednisone to 60 mg orally, without improvement. Denies any chest pain, chest wall pain or palpitations.Denies any  recent long distant travels. Denies any abdominal pain nausea or vomiting. Denies dizziness or vertigo. Denies worsening lower extremity swelling or calf pain . No confusion was reported. Denies any vision changes, double vision or headaches.    ED Course:  BP (!) 149/111   Pulse (!) 116   Temp 98.5 F (36.9 C) (Oral)   Resp (!) 34   Ht 5\' 6"  (1.676 m)   Wt (!) 145.2 kg (320 lb)   LMP 07/07/2017   SpO2 97%   BMI 51.65 kg/m   Received Albuterol x2 (last at 5 am), Solumedrol 125 mg (5 am) and Mag 2 g  EKG Sinus tachycardia  But  no significant change from prior CXR no acute findings CMET unremarkable Tn meg WBC 23.2 (increased prednisone as OP) but mild left shift noted on smear Glu 132   Review of Systems:  As per HPI otherwise all other systems reviewed and are negative  Past Medical History:   Diagnosis Date  . Acanthosis nigricans   . Anxiety   . Arthritis    "knees" (04/28/2017)  . Asthma    Followed by Dr. Annamaria Boots (pulmonology); receives every other week omalizumab injections; has frequent exacerbations  . COPD (chronic obstructive pulmonary disease) ([place])    PFTs in 2002, FEV1/FVC 65, no post bronchodilater test done  . Depression   . GERD (gastroesophageal reflux disease)   . Headache(784.0)    "q couple days" (04/28/2017)  . Helicobacter pylori (H. pylori) infection   . Hypertension, essential   . Insomnia   . Menorrhagia   . Morbid obesity ([place])   . Obesity   . OSA on CPAP    Sleep study 2008 - mild OSA, not enough events to titrate CPAP; wears CPAP now/pt on 04/28/2017  . Pneumonia X 1  . Seasonal allergies   . Shortness of breath   . Tobacco user     Past Surgical History:  Procedure Laterality Date  . CARDIOVERSION N/A 05/30/2017   Procedure: CARDIOVERSION;  Surgeon: Sanda Klein, MD;  Location: [facility];  Service: Cardiovascular;  Laterality: N/A;  . REDUCTION MAMMAPLASTY Bilateral 09/2011  . TUBAL LIGATION  1996   bilateral    Social History Social History   Socioeconomic History  . Marital status: Single    Spouse name: Not on file  . Number of children: 1  . Years of education: Not on file  . Highest education level: Not  on file  Social Needs  . Financial resource strain: Not on file  . Food insecurity - worry: Not on file  . Food insecurity - inability: Not on file  . Transportation needs - medical: Not on file  . Transportation needs - non-medical: Not on file  Occupational History  . Occupation: CNA  Tobacco Use  . Smoking status: Former Smoker    Packs/day: 0.50    Years: 26.00    Pack years: 13.00    Types: Cigarettes    Last attempt to quit: 09/12/2014    Years since quitting: 2.8  . Smokeless tobacco: Never Used  Substance and Sexual Activity  . Alcohol use: No  . Drug use: No  . Sexual activity: No  Other Topics  Concern  . 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     No Known Allergies  Family History  Problem Relation Age of Onset  . Hypertension Mother   . Asthma Daughter   . Cancer Paternal Aunt   . Asthma Maternal Grandmother       Prior to Admission medications   Medication Sig Start Date End Date Taking? Authorizing Provider  albuterol (PROVENTIL HFA;VENTOLIN HFA) 108 (90 Base) MCG/ACT inhaler Inhale 2 puffs into the lungs every 4 (four) hours as needed for wheezing or shortness of breath. 06/20/17  Yes Newlin, Enobong, MD  apixaban (ELIQUIS) 5 MG TABS tablet Take 1 tablet (5 mg total) by mouth 2 (two) times daily. 06/01/17  Yes Regalado, Belkys A, MD  arformoterol (BROVANA) 15 MCG/2ML NEBU Take 2 mLs (15 mcg total) by nebulization 2 (two) times daily. 06/08/17  Yes Elwin Mocha, MD  budesonide (PULMICORT) 0.25 MG/2ML nebulizer solution Take 2 mLs (0.25 mg total) by nebulization 2 (two) times daily. 07/04/17  Yes [name], MD  busPIRone (BUSPAR) 7.5 MG tablet Take 1 tablet (7.5 mg total) 2 (two) times daily by mouth. 03/12/17  Yes [name], MD  butalbital-acetaminophen-caffeine (FIORICET, ESGIC) 50-325-40 MG tablet Take 1-2 tablets by mouth every 6 (six) hours as needed for headache. 12/19/16 12/19/17 Yes Joy, Shawn C, PA-C  diltiazem (CARDIZEM CD) 360 MG 24 hr capsule Take 1 capsule (360 mg total) by mouth daily. 06/25/17  Yes [name], MD  famotidine (PEPCID) 20 MG tablet Take 1 tablet (20 mg total) by mouth 2 (two) times daily. 03/29/17  Yes Ward, Ozella Almond, PA-C  fluticasone (FLONASE) 50 MCG/ACT nasal spray Place 2 sprays into both nostrils daily. 12/13/16  Yes Rai, Ripudeep K, MD  furosemide (LASIX) 40 MG tablet Take 1 tablet (40 mg total) daily by mouth. 03/12/17 03/12/18 Yes Newlin, Enobong, MD  guaiFENesin (MUCINEX) 600 MG 12 hr tablet Take 1 tablet (600 mg total) by mouth 2 (two) times daily. 06/01/17  Yes Regalado,  Belkys A, MD  levalbuterol (XOPENEX) 0.63 MG/3ML nebulizer solution Take 3 mLs (0.63 mg total) by nebulization every 6 (six) hours. 06/01/17  Yes Regalado, Belkys A, MD  loratadine (CLARITIN) 10 MG tablet Take 1 tablet (10 mg total) by mouth daily. 08/30/15  Yes [name], MD  losartan (COZAAR) 100 MG tablet Take 1 tablet (100 mg total) daily by mouth. 03/12/17  Yes Newlin, Enobong, MD  metoprolol tartrate (LOPRESSOR) 100 MG tablet Take 1 tablet (100 mg total) by mouth 2 (two) times daily. 02/05/17  Yes Newlin, Charlane Ferretti, MD  mometasone-formoterol (DULERA) 200-5 MCG/ACT AERO Inhale 2 puffs into the lungs 2 (two) times daily. 06/20/17  Yes Newlin, Enobong, MD  montelukast (SINGULAIR) 10 MG tablet Take 1 tablet (10 mg total) at bedtime by mouth. 03/12/17  Yes Newlin, Enobong, MD  nicotine (NICODERM CQ - DOSED IN MG/24 HOURS) 14 mg/24hr patch Place 1 patch (14 mg total) onto the skin daily. 10/04/16  Yes Reyne Dumas, MD  omeprazole (PRILOSEC) 20 MG capsule Take 1 capsule (20 mg total) by mouth 2 (two) times daily. 04/30/17  Yes Geradine Girt, DO  predniSONE (DELTASONE) 20 MG tablet Take 1 tablet (20 mg total) by mouth daily with breakfast. 06/25/17  Yes [name], MD  sertraline (ZOLOFT) 100 MG tablet Take 1 tablet (100 mg total) daily by mouth. 03/12/17  Yes Newlin, Enobong, MD  spironolactone (ALDACTONE) 100 MG tablet Take 1 tablet (100 mg total) daily by mouth. 03/12/17  Yes Newlin, Enobong, MD  tiotropium (SPIRIVA HANDIHALER) 18 MCG inhalation capsule Place 1 capsule (18 mcg total) into inhaler and inhale daily. 06/20/17 06/20/18 Yes [name], MD  Vitamin D, Ergocalciferol, (DRISDOL) 50000 units CAPS capsule Take 1 capsule (50,000 Units total) by mouth every 7 (seven) days. 06/07/17  Yes Regalado, Belkys A, MD  zolpidem (AMBIEN) 10 MG tablet Take 10 mg by mouth at bedtime. 09/27/10  Yes [provider]    Physical Exam:  Vitals:   07/07/17 0505 07/07/17 0513 07/07/17 0515 07/07/17  0639  BP: (!) 171/112  (!) 189/141 (!) 149/111  Pulse: (!) 119  (!) 106 (!) 116  Resp: (!) 30  (!) 26 (!) 34  Temp:      TempSrc:      SpO2: 97% 99% 100% 97%  Weight:      Height:       Constitutional:, ill appearing, NAD on O2 [place].  Eyes: PERRL, lids and conjunctivae normal ENMT: Mucous membranes are moist, without exudate or lesions  Neck: normal, supple, no masses, no thyromegaly Respiratory: clear to auscultation bilaterally, but audible bilateral  wheezing, no crackles. No significant respiratory effort while on O2 [place]  Cardiovascular: Regular rate and rhythm,  murmur, rubs or gallops. Trace bilateral lower  extremity edema. 2+ pedal pulses. No carotid bruits.  Abdomen:  Morbidly obese  Soft, non tender, No hepatosplenomegaly. Bowel sounds positive.  Musculoskeletal: no clubbing / cyanosis. Moves all extremities Skin: no jaundice, No lesions.  Neurologic: Sensation intact  Strength equal in all extremities Psychiatric:   Alert and oriented x 3. Normal mood.     Labs on Admission: I have personally reviewed following labs and imaging studies  CBC: Recent Labs  Lab 07/07/17 0508  WBC 23.2*  NEUTROABS 18.4*  HGB 12.3  HCT 40.0  MCV 90.3  PLT 402*    Basic Metabolic Panel: Recent Labs  Lab 07/07/17 0508  NA 139  K 3.5  CL 99*  CO2 28  GLUCOSE 132*  BUN 12  CREATININE 0.90  CALCIUM 8.4*    GFR: Estimated Creatinine Clearance: 118 mL/min (by C-G formula based on SCr of 0.9 mg/dL).  Liver Function Tests: No results for input(s): AST, ALT, ALKPHOS, BILITOT, PROT, ALBUMIN in the last 168 hours. No results for input(s): LIPASE, AMYLASE in the last 168 hours. No results for input(s): AMMONIA in the last 168 hours.  Coagulation Profile: No results for input(s): INR, PROTIME in the last 168 hours.  Cardiac Enzymes: No results for input(s): CKTOTAL, CKMB, CKMBINDEX, TROPONINI in the last 168 hours.  BNP (last 3 results) No results for input(s): PROBNP in the  last 8760 hours.  HbA1C: No  results for input(s): HGBA1C in the last 72 hours.  CBG: No results for input(s): GLUCAP in the last 168 hours.  Lipid Profile: No results for input(s): CHOL, HDL, LDLCALC, TRIG, CHOLHDL, LDLDIRECT in the last 72 hours.  Thyroid Function Tests: No results for input(s): TSH, T4TOTAL, FREET4, T3FREE, THYROIDAB in the last 72 hours.  Anemia Panel: No results for input(s): VITAMINB12, FOLATE, FERRITIN, TIBC, IRON, RETICCTPCT in the last 72 hours.  Urine analysis:    Component Value Date/Time   COLORURINE YELLOW 04/29/2017 0519   APPEARANCEUR HAZY (A) 04/29/2017 0519   LABSPEC 1.008 04/29/2017 0519   PHURINE 6.0 04/29/2017 0519   GLUCOSEU NEGATIVE 04/29/2017 0519   GLUCOSEU NEG mg/dL 10/28/2007 2049   HGBUR NEGATIVE 04/29/2017 0519   BILIRUBINUR NEGATIVE 04/29/2017 0519   KETONESUR NEGATIVE 04/29/2017 0519   PROTEINUR NEGATIVE 04/29/2017 0519   UROBILINOGEN 1.0 11/21/2014 0707   NITRITE NEGATIVE 04/29/2017 0519   LEUKOCYTESUR NEGATIVE 04/29/2017 0519    Sepsis Labs: @LABRCNTIP (procalcitonin:4,lacticidven:4) )No results found for this or any previous visit (from the past 240 hour(s)).   Radiological Exams on Admission: Dg Chest 2 View  Result Date: 07/07/2017 CLINICAL DATA:  47 year old female with shortness of breath and wheezing. EXAM: CHEST - 2 VIEW COMPARISON:  Chest radiograph dated 06/05/2017 FINDINGS: Minimal left lung base atelectatic changes. No focal consolidation, pleural effusion, or pneumothorax. Stable cardiac silhouette. No acute osseous pathology. IMPRESSION: No active cardiopulmonary disease. Electronically Signed   By: Anner Crete M.D.   On: 07/07/2017 03:48    EKG: Independently reviewed.  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 Afebrile.  No O2 at home, was in the 90s on presentation, dropping once while sleeping to 88% . Received Albuterol x2 (last at 5 am), Solumedrol 125 mg (5 am) and Mag 2 g  CXR no acute findings CMET unremarkable WBC 23.2 (increased prednisone as OP) but mild left shift noted on smear. After these treatments, patient's symptoms appear to improve.  Admit to Tele Obs Doxyccline  per Pharmacy  Duonebs and Albuterol  Continue Solumedrol 60 mg IV q 8 H Protonix and SSI due to steroids  Mucinex  O2 continued for now. Patient may benefit from home O2 upon discharge CBC in am Incentive spirometry Continue Singulair and nasal sprays  Sputum culture  Procalcitonin and Lactic Acid Follow up with Dr. Annamaria Boots, Pulmonary as OP-patient was due for visit today, 3/11, but will have to be postponed after discharge. Can continue Xolair injections as OP   Leukocytosis, likely reactive, related to exacerbation and chronic steroid use as above versus underlying infection . Afebrile. WBC 23k with L shift noted  Antibiotics as above  Sputum culture  Repeat CBC in AM     Steroid induced hyperglycemia/ Pre-DM Current blood sugar level is 132 Lab Results  Component Value Date   HGBA1C 5.6 04/28/2017  Hgb A1C  SSI    Hypertension with urgency on admission BP  173/110   Pulse   111  . Patient has not yet  taken her morning meds.  Resume home anti-hypertensive medications with Losartan, Metoprolol, Lasix Add Hydralazine Q6 hours as needed for BP >180  Atrial Fibrillation sp DCCV 05/30/17  CHADSVASC 2  on anticoagulation with Eliquis. Current EKG ST without acute changes   Rate controlled Continue meds including Cardizem and Eliquis Follow as OP with Dr. Sallyanne Kuster    GERD, no acute symptoms Continue PPI  OSA Continue CPAP nightly  Anxiety and Depression Continue Zoloft and Buspar     Chronic congestive heart failure  CXR NAD, Weight 320 lbs  Tn neg. EKG no acute changes .  Last 2D echo shows EF 03-15%, grade 1 diastolic dysfunction.  No significant lower extremity edema.  Appears compensated Continue meds including Lasix and spironolactone, continue Lopressor.  Tobacco abuse   Nicotine patch  Counseled cessation  Insommia Continue Ambien pm    DVT prophylaxis:  Eliquis  Code Status:    Full  Family Communication:  Discussed with patient Disposition Plan: Expect patient to be discharged to home after condition improves Consults called:    None Admission status: Tele OBs    Sharene Butters, PA-C [facility]   Amion text  [phone]   07/07/2017, 6:51 AM

## 2017-07-07 NOTE — ED Notes (Signed)
Noted to desat while sleeping on room air.  Placed on 2L [place].  Immediately rebounded to mid 90's when woken up.  Admitting at the bedside.

## 2017-07-07 NOTE — ED Notes (Signed)
Pt ambulated to the restroom without difficulty. Gait steady and even.  

## 2017-07-07 NOTE — ED Notes (Signed)
Attempted to pull off pt IV for blood. Pt still refusing lab sticks at this time.

## 2017-07-07 NOTE — ED Triage Notes (Addendum)
C/o wheezing x 3 days and generalized weakness.  Out of Pulmicort.  Using Brovana nebulizer and Albuterol inhaler at home without relief.  Speaking in short phrases.

## 2017-07-07 NOTE — Progress Notes (Signed)
Pt refused CPAP for the night. Rt will continue to monitor as needed.

## 2017-07-07 NOTE — ED Provider Notes (Addendum)
[facility] Provider Note   CSN: [id number] Arrival date & time: 07/07/17  0234     History   Chief Complaint Chief Complaint  Patient presents with  . Wheezing    HPI April [name] is a 47 y.o. female.  The history is provided by the patient and medical records.    47 year old female with history of anxiety, asthma, COPD, depression, GERD, insomnia, obesity, sleep apnea, seasonal allergies, presenting to the ED for shortness of breath and wheezing.  States this is been progressively worsening over the past 3 days.  Reports today she got "real bad off" so she decided to come in.  She reports a dry cough and some generalized weakness but denies any fever or chills.  States her chest does feel tight and she has had a little bit of pain as well.  States she has been doing nebs about every hour for the past day without any relief.  She has been using her rescue inhaler as well.  States she is on steroid taper currently, was supposed to be taking 20 mg daily but because of her increased work of breathing she has been taking 60 mg daily but still has not had any relief.  She has had multiple prior admissions for asthma exacerbation in the past.  She has had no prior intubations.  Past Medical History:  Diagnosis Date  . Acanthosis nigricans   . Anxiety   . Arthritis    "knees" (04/28/2017)  . Asthma    Followed by Dr. Annamaria Boots (pulmonology); receives every other week omalizumab injections; has frequent exacerbations  . COPD (chronic obstructive pulmonary disease) ([place])    PFTs in 2002, FEV1/FVC 65, no post bronchodilater test done  . Depression   . GERD (gastroesophageal reflux disease)   . Headache(784.0)    "q couple days" (04/28/2017)  . Helicobacter pylori (H. pylori) infection   . Hypertension, essential   . Insomnia   . Menorrhagia   . Morbid obesity ([place])   . Obesity   . OSA on CPAP    Sleep study 2008 - mild OSA, not enough events to  titrate CPAP; wears CPAP now/pt on 04/28/2017  . Pneumonia X 1  . Seasonal allergies   . Shortness of breath   . Tobacco user     Patient Active Problem List   Diagnosis Date Noted  . Hyperglycemia, drug-induced 06/06/2017  . New onset atrial fibrillation ([place])   . Acute on chronic respiratory failure with hypoxia ([place]) 05/26/2017  . Acute asthma exacerbation 02/15/2017  . COPD with acute exacerbation ([place]) 02/10/2017  . Dyspnea 02/04/2017  . Asthma 02/04/2017  . Acute hyperglycemia 02/04/2017  . Chronic diastolic CHF (congestive heart failure) ([place]) 01/17/2017  . SIRS (systemic inflammatory response syndrome) ([place]) 01/17/2017  . Depression 01/17/2017  . Asthma exacerbation 01/17/2017  . SOB (shortness of breath)   . Asthma exacerbation in COPD ([place]) 12/10/2016  . Moderate persistent asthma with exacerbation 10/19/2016  . Diarrhea 10/19/2016  . Normocytic anemia 10/02/2016  . Respiratory failure ([place]) 10/02/2016  . Pulmonary edema 06/22/2016  . Hypertensive urgency 06/22/2016  . Malignant hypertension due to primary aldosteronism ([place]) 05/05/2016  . Elevated hemoglobin A1c 05/05/2016  . GERD (gastroesophageal reflux disease) 08/30/2015  . Anxiety and depression 08/30/2015  . Prediabetes 08/22/2015  . Seasonal allergic rhinitis 08/29/2013  . Leukocytosis 10/21/2012  . Tobacco abuse 10/07/2012  . Asthma, chronic obstructive, without status asthmaticus ([place]) 05/07/2012  . Knee pain, bilateral  04/25/2011  . Primary insomnia 03/14/2011  . Mild obstructive sleep apnea 12/19/2010  . Hypokalemia 08/13/2010  . Cervical back pain with evidence of disc disease 04/08/2008  . Essential hypertension 07/31/2006  . Morbid obesity with body mass index of 50.0-59.9 in adult ([place]) 06/17/2006  . Major depressive disorder, recurrent episode ([place]) 04/10/2006    Past Surgical History:  Procedure Laterality Date  . CARDIOVERSION N/A 05/30/2017   Procedure: CARDIOVERSION;  Surgeon: Sanda Klein, MD;  Location: [facility];  Service: Cardiovascular;  Laterality: N/A;  . REDUCTION MAMMAPLASTY Bilateral 09/2011  . TUBAL LIGATION  1996   bilateral    OB History    No data available       Home Medications    Prior to Admission medications   Medication Sig Start Date End Date Taking? Authorizing Provider  albuterol (PROVENTIL HFA;VENTOLIN HFA) 108 (90 Base) MCG/ACT inhaler Inhale 2 puffs into the lungs every 4 (four) hours as needed for wheezing or shortness of breath. 06/20/17   [name], MD  apixaban (ELIQUIS) 5 MG TABS tablet Take 1 tablet (5 mg total) by mouth 2 (two) times daily. 06/01/17   Regalado, Belkys A, MD  arformoterol (BROVANA) 15 MCG/2ML NEBU Take 2 mLs (15 mcg total) by nebulization 2 (two) times daily. 06/08/17   Elwin Mocha, MD  budesonide (PULMICORT) 0.25 MG/2ML nebulizer solution Take 2 mLs (0.25 mg total) by nebulization 2 (two) times daily. 07/04/17   [name], MD  busPIRone (BUSPAR) 7.5 MG tablet Take 1 tablet (7.5 mg total) 2 (two) times daily by mouth. 03/12/17   [name], MD  butalbital-acetaminophen-caffeine (FIORICET, ESGIC) 50-325-40 MG tablet Take 1-2 tablets by mouth every 6 (six) hours as needed for headache. 12/19/16 12/19/17  Joy, Shawn C, PA-C  diltiazem (CARDIZEM CD) 360 MG 24 hr capsule Take 1 capsule (360 mg total) by mouth daily. 06/25/17   [name], MD  famotidine (PEPCID) 20 MG tablet Take 1 tablet (20 mg total) by mouth 2 (two) times daily. 03/29/17   Ward, Ozella Almond, PA-C  fluticasone (FLONASE) 50 MCG/ACT nasal spray Place 2 sprays into both nostrils daily. 12/13/16   Rai, Vernelle Emerald, MD  furosemide (LASIX) 40 MG tablet Take 1 tablet (40 mg total) daily by mouth. 03/12/17 03/12/18  [name], MD  guaiFENesin (MUCINEX) 600 MG 12 hr tablet Take 1 tablet (600 mg total) by mouth 2 (two) times daily. 06/01/17   Regalado, Belkys A, MD  levalbuterol (XOPENEX) 0.63 MG/3ML nebulizer solution Take 3 mLs (0.63 mg  total) by nebulization every 6 (six) hours. 06/01/17   Regalado, Belkys A, MD  loratadine (CLARITIN) 10 MG tablet Take 1 tablet (10 mg total) by mouth daily. 08/30/15   [name], MD  losartan (COZAAR) 100 MG tablet Take 1 tablet (100 mg total) daily by mouth. 03/12/17   [name], MD  metoprolol tartrate (LOPRESSOR) 100 MG tablet Take 1 tablet (100 mg total) by mouth 2 (two) times daily. 02/05/17   [name], MD  mometasone-formoterol (DULERA) 200-5 MCG/ACT AERO Inhale 2 puffs into the lungs 2 (two) times daily. 06/20/17   [name], MD  montelukast (SINGULAIR) 10 MG tablet Take 1 tablet (10 mg total) at bedtime by mouth. 03/12/17   [name], MD  nicotine (NICODERM CQ - DOSED IN MG/24 HOURS) 14 mg/24hr patch Place 1 patch (14 mg total) onto the skin daily. 10/04/16   Reyne Dumas, MD  omeprazole (PRILOSEC) 20 MG capsule Take 1 capsule (20 mg total)  by mouth 2 (two) times daily. 04/30/17   Geradine Girt, DO  predniSONE (DELTASONE) 20 MG tablet Take 1 tablet (20 mg total) by mouth daily with breakfast. 06/25/17   [name], MD  sertraline (ZOLOFT) 100 MG tablet Take 1 tablet (100 mg total) daily by mouth. 03/12/17   [name], MD  spironolactone (ALDACTONE) 100 MG tablet Take 1 tablet (100 mg total) daily by mouth. 03/12/17   [name], MD  tiotropium (SPIRIVA HANDIHALER) 18 MCG inhalation capsule Place 1 capsule (18 mcg total) into inhaler and inhale daily. 06/20/17 06/20/18  [name], MD  Vitamin D, Ergocalciferol, (DRISDOL) 50000 units CAPS capsule Take 1 capsule (50,000 Units total) by mouth every 7 (seven) days. 06/07/17   Regalado, Belkys A, MD  zolpidem (AMBIEN) 10 MG tablet Take 10 mg by mouth at bedtime. 09/27/10   [provider]    Family History Family History  Problem Relation Age of Onset  . Hypertension Mother   . Asthma Daughter   . Cancer Paternal Aunt   . Asthma Maternal Grandmother     Social History Social History    Tobacco Use  . Smoking status: Former Smoker    Packs/day: 0.50    Years: 26.00    Pack years: 13.00    Types: Cigarettes    Last attempt to quit: 09/12/2014    Years since quitting: 2.8  . Smokeless tobacco: Never Used  Substance Use Topics  . Alcohol use: No  . Drug use: No     Allergies   Patient has no known allergies.   Review of Systems Review of Systems  Respiratory: Positive for cough, chest tightness, shortness of breath and wheezing.   All other systems reviewed and are negative.    Physical Exam Updated Vital Signs BP (!) 171/132 (BP Location: Right Arm)   Pulse (!) 116   Temp 98.5 F (36.9 C) (Oral)   Resp 16   Ht 5\' 6"  (1.676 m)   Wt (!) 145.2 kg (320 lb)   LMP 07/07/2017   SpO2 98%   BMI 51.65 kg/m   Physical Exam  Constitutional: She is oriented to person, place, and time. She appears well-developed and well-nourished.  HENT:  Head: Normocephalic and atraumatic.  Mouth/Throat: Oropharynx is clear and moist.  Eyes: Conjunctivae and EOM are normal. Pupils are equal, round, and reactive to light.  Neck: Normal range of motion.  Cardiovascular: Normal rate, regular rhythm and normal heart sounds.  Pulmonary/Chest: Effort normal. Tachypnea noted. She has wheezes.  Patient with increased work of breathing, audible wheezing from the bedside, speaking in very short, truncated sentences and appears very winded when doing so  Abdominal: Soft. Bowel sounds are normal.  Musculoskeletal: Normal range of motion.  Neurological: She is alert and oriented to person, place, and time.  Skin: Skin is warm and dry.  Psychiatric: She has a normal mood and affect.  Nursing note and vitals reviewed.    ED Treatments / Results  Labs (all labs ordered are listed, but only abnormal results are displayed) Labs Reviewed - No data to display  EKG  EKG Interpretation  Date/Time:  Monday July 07 2017 02:50:32 EDT Ventricular Rate:  116 PR Interval:  122 QRS  Duration: 90 QT Interval:  336 QTC Calculation: 467 R Axis:   12 Text Interpretation:  Sinus tachycardia Right atrial enlargement Nonspecific ST abnormality Abnormal ECG Baseline wander Otherwise no significant change Confirmed by Deno Etienne [phone]) on 07/07/2017 4:12:23 AM  Radiology Dg Chest 2 View  Result Date: 07/07/2017 CLINICAL DATA:  47 year old female with shortness of breath and wheezing. EXAM: CHEST - 2 VIEW COMPARISON:  Chest radiograph dated 06/05/2017 FINDINGS: Minimal left lung base atelectatic changes. No focal consolidation, pleural effusion, or pneumothorax. Stable cardiac silhouette. No acute osseous pathology. IMPRESSION: No active cardiopulmonary disease. Electronically Signed   By: Anner Crete M.D.   On: 07/07/2017 03:48    Procedures Procedures (including critical care time)  CRITICAL CARE Performed by: Larene Pickett   Total critical care time: 40 minutes  Critical care time was exclusive of separately billable procedures and treating other patients.  Critical care was necessary to treat or prevent imminent or life-threatening deterioration.  Critical care was time spent personally by me on the following activities: development of treatment plan with patient and/or surrogate as well as nursing, discussions with consultants, evaluation of patient's response to treatment, examination of patient, obtaining history from patient or surrogate, ordering and performing treatments and interventions, ordering and review of laboratory studies, ordering and review of radiographic studies, pulse oximetry and re-evaluation of patient's condition.   Medications Ordered in ED Medications  albuterol (PROVENTIL,VENTOLIN) solution continuous neb (10 mg/hr Nebulization New Bag/Given 07/07/17 0509)  albuterol (PROVENTIL) (2.5 MG/3ML) 0.083% nebulizer solution 5 mg (5 mg Nebulization Given 07/07/17 0302)  methylPREDNISolone sodium succinate (SOLU-MEDROL) 125 mg/2 mL  injection 125 mg (125 mg Intravenous Given 07/07/17 0457)  magnesium sulfate IVPB 2 g 50 mL (2 g Intravenous New Bag/Given 07/07/17 0457)     Initial Impression / Assessment and Plan / ED Course  I have reviewed the triage vital signs and the nursing notes.  Pertinent labs & imaging results that were available during my care of the patient were reviewed by me and considered in my medical decision making (see chart for details).  47 year old female presenting to the ED with shortness of breath and wheezing.  Has been having difficulty breathing for the past 3 days or so.  Recent URI type symptoms.  She is afebrile and nontoxic.  Breathing does appear labile and she has audible wheezing from the bedside.  Oxygen saturation remains normal on room air.  Patient has had multiple admissions for asthma exacerbation in the past.  No prior intubations.  Chest x-ray was obtained from triage and is negative.  We will plan for screening labs.  She was given IV Solu-Medrol, magnesium, and started on hour-long neb treatment.  6:31 AM Patient has been on CAT for 1 hour.  Still appears labored and his diffuse expiratory wheezes.  States she only feels mild improvement.  Will admit for ongoing care.  Labs overall unremarkable, does have leukocytosis which I suspect is from her chronic steroid use.  Discussed with PA [facility]-- she will come and admit patient.  Final Clinical Impressions(s) / ED Diagnoses   Final diagnoses:  Moderate persistent asthma with exacerbation    ED Discharge Orders    None       Larene Pickett, PA-C 07/07/17 [place], Cornel[place], PA-C 07/07/17 [place], [place], DO 07/07/17 [phone]

## 2017-07-07 NOTE — ED Notes (Signed)
Pt keeps removing herself from monitor.

## 2017-07-07 NOTE — ED Notes (Signed)
Heart Healthy/Carb Modified Breakfast Tray Ordered @ 0838-per RN-called by Levada Dy

## 2017-07-08 ENCOUNTER — Telehealth: Payer: Self-pay | Admitting: Family Medicine

## 2017-07-08 DIAGNOSIS — D72829 Elevated white blood cell count, unspecified: Secondary | ICD-10-CM | POA: Diagnosis present

## 2017-07-08 DIAGNOSIS — G4733 Obstructive sleep apnea (adult) (pediatric): Secondary | ICD-10-CM | POA: Diagnosis present

## 2017-07-08 DIAGNOSIS — K219 Gastro-esophageal reflux disease without esophagitis: Secondary | ICD-10-CM | POA: Diagnosis present

## 2017-07-08 DIAGNOSIS — E0865 Diabetes mellitus due to underlying condition with hyperglycemia: Secondary | ICD-10-CM | POA: Diagnosis not present

## 2017-07-08 DIAGNOSIS — E0965 Drug or chemical induced diabetes mellitus with hyperglycemia: Secondary | ICD-10-CM | POA: Diagnosis not present

## 2017-07-08 DIAGNOSIS — I158 Other secondary hypertension: Secondary | ICD-10-CM | POA: Diagnosis present

## 2017-07-08 DIAGNOSIS — J4541 Moderate persistent asthma with (acute) exacerbation: Secondary | ICD-10-CM | POA: Diagnosis present

## 2017-07-08 DIAGNOSIS — I10 Essential (primary) hypertension: Secondary | ICD-10-CM | POA: Diagnosis not present

## 2017-07-08 DIAGNOSIS — I5032 Chronic diastolic (congestive) heart failure: Secondary | ICD-10-CM | POA: Diagnosis present

## 2017-07-08 DIAGNOSIS — F419 Anxiety disorder, unspecified: Secondary | ICD-10-CM | POA: Diagnosis present

## 2017-07-08 DIAGNOSIS — I160 Hypertensive urgency: Secondary | ICD-10-CM | POA: Diagnosis present

## 2017-07-08 DIAGNOSIS — D649 Anemia, unspecified: Secondary | ICD-10-CM | POA: Diagnosis present

## 2017-07-08 DIAGNOSIS — Z6841 Body Mass Index (BMI) 40.0 and over, adult: Secondary | ICD-10-CM | POA: Diagnosis not present

## 2017-07-08 DIAGNOSIS — I110 Hypertensive heart disease with heart failure: Secondary | ICD-10-CM | POA: Diagnosis present

## 2017-07-08 DIAGNOSIS — I4891 Unspecified atrial fibrillation: Secondary | ICD-10-CM | POA: Diagnosis present

## 2017-07-08 DIAGNOSIS — Z7901 Long term (current) use of anticoagulants: Secondary | ICD-10-CM | POA: Diagnosis not present

## 2017-07-08 DIAGNOSIS — Z7952 Long term (current) use of systemic steroids: Secondary | ICD-10-CM | POA: Diagnosis not present

## 2017-07-08 DIAGNOSIS — E099 Drug or chemical induced diabetes mellitus without complications: Secondary | ICD-10-CM | POA: Diagnosis present

## 2017-07-08 DIAGNOSIS — F329 Major depressive disorder, single episode, unspecified: Secondary | ICD-10-CM | POA: Diagnosis present

## 2017-07-08 DIAGNOSIS — J9601 Acute respiratory failure with hypoxia: Secondary | ICD-10-CM | POA: Diagnosis not present

## 2017-07-08 DIAGNOSIS — E2609 Other primary hyperaldosteronism: Secondary | ICD-10-CM | POA: Diagnosis present

## 2017-07-08 DIAGNOSIS — Z87891 Personal history of nicotine dependence: Secondary | ICD-10-CM | POA: Diagnosis not present

## 2017-07-08 DIAGNOSIS — J441 Chronic obstructive pulmonary disease with (acute) exacerbation: Secondary | ICD-10-CM | POA: Diagnosis present

## 2017-07-08 DIAGNOSIS — T380X5A Adverse effect of glucocorticoids and synthetic analogues, initial encounter: Secondary | ICD-10-CM | POA: Diagnosis present

## 2017-07-08 DIAGNOSIS — F5101 Primary insomnia: Secondary | ICD-10-CM | POA: Diagnosis present

## 2017-07-08 DIAGNOSIS — Z79899 Other long term (current) drug therapy: Secondary | ICD-10-CM | POA: Diagnosis not present

## 2017-07-08 LAB — CBC
HCT: 38.1 % (ref 36.0–46.0)
Hemoglobin: 11.8 g/dL — ABNORMAL LOW (ref 12.0–15.0)
MCH: 27.6 pg (ref 26.0–34.0)
MCHC: 31 g/dL (ref 30.0–36.0)
MCV: 89 fL (ref 78.0–100.0)
PLATELETS: 322 10*3/uL (ref 150–400)
RBC: 4.28 MIL/uL (ref 3.87–5.11)
RDW: 18.3 % — ABNORMAL HIGH (ref 11.5–15.5)
WBC: 27.3 10*3/uL — AB (ref 4.0–10.5)

## 2017-07-08 LAB — GLUCOSE, CAPILLARY
GLUCOSE-CAPILLARY: 359 mg/dL — AB (ref 65–99)
GLUCOSE-CAPILLARY: 149 mg/dL — AB (ref 65–99)
GLUCOSE-CAPILLARY: 326 mg/dL — AB (ref 65–99)
Glucose-Capillary: 245 mg/dL — ABNORMAL HIGH (ref 65–99)

## 2017-07-08 LAB — LACTIC ACID, PLASMA: Lactic Acid, Venous: 2.5 mmol/L (ref 0.5–1.9)

## 2017-07-08 LAB — MRSA PCR SCREENING: MRSA by PCR: NEGATIVE

## 2017-07-08 MED ORDER — SODIUM CHLORIDE 0.9 % IV BOLUS (SEPSIS)
500.0000 mL | Freq: Once | INTRAVENOUS | Status: AC
  Administered 2017-07-08: 500 mL via INTRAVENOUS

## 2017-07-08 MED ORDER — TRAMADOL HCL 50 MG PO TABS: 50.0000 mg | ORAL_TABLET | Freq: Four times a day (QID) | ORAL | Status: DC | PRN

## 2017-07-08 MED ORDER — PANTOPRAZOLE SODIUM 40 MG PO TBEC
40.0000 mg | DELAYED_RELEASE_TABLET | Freq: Every day | ORAL | Status: DC
  Administered 2017-07-09 – 2017-07-12 (×4): 40 mg via ORAL
  Filled 2017-07-08 (×4): qty 1

## 2017-07-08 MED ORDER — DOXYCYCLINE HYCLATE 100 MG PO TABS
100.0000 mg | ORAL_TABLET | Freq: Two times a day (BID) | ORAL | Status: DC
  Administered 2017-07-08 – 2017-07-12 (×8): 100 mg via ORAL
  Filled 2017-07-08 (×8): qty 1

## 2017-07-08 NOTE — Progress Notes (Signed)
CRITICAL VALUE ALERT  Critical Value:  Lactic Acid 2.5  Date & Time Notied:  07/08/17; 12:20am  Provider Notified: Baltazar Najjar, NP   Orders Received/Actions taken: [place]us ordered. Will continue to monitor,

## 2017-07-08 NOTE — Progress Notes (Addendum)
This RN brought the pt from [facility], ED overflow. Pt was assessed to be AxOx4. Pt refused skin check. Vital signs completed. Cardiac telemetry initiated. Pt oriented to room. Pt says they will only take one blood draw sticking and refused any other. Will continue to monitor.

## 2017-07-08 NOTE — Progress Notes (Signed)
Pt refusing CPAP for the night.  

## 2017-07-08 NOTE — Telephone Encounter (Signed)
Briefly met with patient during her admission and we spoke about the TCC clinic. Patient agreed to be seen by provider on Friday 3/22 at 2pm. During our conversation patient expressed that she was doing ok and that she had even told us in our conversation (last week) that she would end up in the ED. Patient didn't know when she will be discharged. She was waiting for provider to come and speak with her.   Patient also mentioned that she has family support and transportation. Her disability case is still in process and is worried about her housing. Informed patient that we can give her more information about Legal Aid when she comes to the office. Patient was appreciative. Informed patient that we will be calling to check on her status following her discharge. Patient confirmed that she is currently using [facility] on [address] but stated that she will like to use our pharmacy but knows that she has to pay some of her balance. Patient mentioned that she doesn't have any moment and she will [place] [facility] in the mean time. No further questions or concerns.

## 2017-07-08 NOTE — Progress Notes (Signed)
Inpatient Diabetes Program Recommendations  AACE/ADA: New Consensus Statement on Inpatient Glycemic Control (2015)  Target Ranges:  Prepandial:   less than 140 mg/dL      Peak postprandial:   less than 180 mg/dL (1-2 hours)      Critically ill patients:  140 - 180 mg/dL   Results for April [name], April [name] (MRN [id number]) as of 07/08/2017 11:17  Ref. Range 05/03/2016 16:10 12/11/2016 14:30 02/04/2017 08:39 04/28/2017 04:29 07/07/2017 08:45  Hemoglobin A1C Latest Ref Range: 4.8 - 5.6 % 6.4 5.8 (H) 6.1 (H) 5.6 7.2 (H)   Spoke with patient about her glucose and A1c levels. Patient reports going to the [facility] and has been diagnosed with pre DM in the past and has been on Metformin, but she has not taken it in a couple of months.  Spoke with patient briefly about diet and CHO choices, portion sizes. Spoke with patient about effects of steroids and glucose control. Spoke with patient about being active and possible bariatric surgery. Patient reports going through the workup with bariatric surgery, however was pulled last minute because she kept coming into the hospital.  Patient receptive to information. Spoke to her about possibly getting a glucose meter and be placed back on metformin at time of d/c depending on the attending MDs plan. Discussed how and when to check her glucose levels.  Thanks,  Tama Headings RN, MSN, BC-ADM, [facility] Inpatient Diabetes Coordinator Team Pager [phone] (8a-5p)

## 2017-07-08 NOTE — Progress Notes (Addendum)
[facility] Progress Note  Brief Summary: April [name] is a 47 y.o. female with medical history significant for morbid obesity, anxiety, major depressive disorder, hypertension, prediabetes,OSA on CPAP, asthma with multiple hospitalizations for asthma on Xolair injections every 2 weeks, on chronic prednisone 20 mg daily, with multiple ED visits for asthma exacerbation in the past, presenting to the ED with increasing shortness of breath and wheezing for the last 3 days. Patient reported dry cough, and generalized weakness, subjective fever without chills.  Denies any productive cough, or hemoptysis.  The patient reports sick contact in her mother, who was "fighting a viral infection "patient had been on nebulizers about every hour for the past day, without any relief and had been using rescue inhalers increasing her chronic prednisone to 60 mg orally, without improvement. Denies any chest pain, chest wall pain or palpitations.Denies any  recent long distant travels. Denies any abdominal pain nausea or vomiting. Denies dizziness or vertigo. Denies worsening lower extremity swelling or calf pain . No confusion was reported. Denies any vision changes, double vision or headaches.    Subjective: feeling better, starting to "cough some phlegm up".  SOB at rest better, still gets winded getting OOB to bathroom.   Vitals:   07/08/17 0457 07/08/17 0946 07/08/17 1239 07/08/17 1437  BP:  (!) 161/89  (!) 147/77  Pulse:  77  66  Resp:  20  20  Temp:    98.4 F (36.9 C)  TempSrc:    Oral  SpO2:  100% 98% 100%  Weight: (!) 159 kg (350 lb 8.5 oz)     Height:        Inpatient medications: . apixaban  5 mg Oral BID  . busPIRone  7.5 mg Oral BID  . diltiazem  360 mg Oral Daily  . doxycycline  100 mg Oral Q12H  . fluticasone  2 spray Each Nare Daily  . furosemide  40 mg Oral Daily  . guaiFENesin  600 mg Oral BID  . insulin aspart  0-20 Units Subcutaneous TID WC  . ipratropium-albuterol  3 mL  Nebulization Q4H  . loratadine  10 mg Oral Daily  . losartan  100 mg Oral Daily  . methylPREDNISolone (SOLU-MEDROL) injection  60 mg Intravenous Q8H  . metoprolol tartrate  100 mg Oral BID  . montelukast  10 mg Oral QHS  . nicotine  14 mg Transdermal Daily  . [START ON 07/09/2017] pantoprazole  40 mg Oral Daily  . sertraline  100 mg Oral Daily  . spironolactone  100 mg Oral Daily  . zolpidem  5 mg Oral QHS    acetaminophen **OR** acetaminophen, albuterol, bisacodyl, butalbital-acetaminophen-caffeine, hydrALAZINE, HYDROcodone-acetaminophen, ketorolac, ondansetron **OR** ondansetron (ZOFRAN) IV, senna-docusate  Exam: Constitutional:, ill appearing, NAD on O2 [place].  Eyes: PERRL, lids and conjunctivae normal ENMT: Mucous membranes are moist, without exudate or lesions  Neck: normal, supple, no masses, no thyromegaly Respiratory: bilat scattered wheezes, not severe, no ^wob Cardiovascular: Regular rate and rhythm,  murmur, rubs or gallops. Trace bilateral lower  extremity edema. 2+ pedal pulses. No carotid bruits.  Abdomen:  Morbidly obese  Soft, non tender, No hepatosplenomegaly. Bowel sounds positive.  Musculoskeletal: no clubbing / cyanosis. Moves all extremities Skin: no jaundice, No lesions.  Neurologic: Sensation intact  Strength equal in all extremities Psychiatric:   Alert and oriented x 3. Normal mood.    Principal Problem:   Moderate persistent asthma with exacerbation  Active Problems:   Morbid obesity with body mass index of  50.0-59.9 in adult [facility])   Essential hypertension   Cervical back pain with evidence of disc disease   OSA on CPAP   Primary insomnia   Knee pain, bilateral   Tobacco abuse   Leukocytosis   Seasonal allergic rhinitis   Prediabetes   GERD (gastroesophageal reflux disease)   Anxiety and depression   Malignant hypertension due to primary aldosteronism (H[facility])   Normocytic anemia   Chronic diastolic CHF (congestive heart failure) ([place])   COPD with  acute exacerbation ([place])   New onset atrial fibrillation ([facility])   Hyperglycemia, drug-induced   Problems: Acute hypoxic resp failure/ acute COPD exacerbation - hx asthma w/ severe presentations to the ED for same. No O2 at home, was in the 90s on presentation, dropping once while sleeping to 88% . Received Albuterol x2 (last at 5 am), IV steroids and IV Mg.  CXR no acute findings -Doxycline bid 100 mg -Duonebs and Albuterol  -Continue Solumedrol 60 mg IV q 8 H -Protonix and SSI due to steroids  -Mucinex  -O2 continued for now. Patient may benefit from home O2 upon discharge -Incentive spirometry -Continue Singulair and nasal sprays  -Follow up with Dr. Annamaria Boots, Pulm after DC - dc telemetry - reviewed home inhaler meds with patient >> mainstay is pulmicort and brovana with prn albuterol.  She also uses Dulera when away from home as sub for pulmicort + brovana. She should not be taking Xopenex and Spiriva, have dc'd from her home PTA med list.  - improving, probable ready for dc in 24-48 hrs  Leukocytosis, likely reactive, related to exacerbation and chronic steroid use as above versus underlying infection . Afebrile. WBC 23k with L shift noted  Antibiotics as above  Sputum culture   Repeat CBC in AM   Steroid induced hyperglycemia/ Pre-DM Current blood sugar level is 132  Hypertensive urgency - Resumed home anti-hypertensive medications with Losartan, Metoprolol and Cardizem, also lasix and aldactone   Atrial Fibrillation sp DCCV 05/30/17  CHADSVASC 2  on anticoagulation with Eliquis. EKG admit ST without acute changes   Rate controlled Continue meds including BB / Cardizem and Eliquis Follow as OP with Dr. Sallyanne Kuster  GERD, no acute symptoms Continue PPI  OSA Continue CPAP nightly  Anxiety and Depression Continue Zoloft and Buspar     Chronic congestive heart failure  CXR NAD, Weight 320 lbs  Tn neg. EKG no acute changes .  Last 2D echo shows EF 40-98%, grade 1 diastolic  dysfunction.  No significant lower extremity edema.  Appears compensated Continue meds including Lasix and spironolactone, continue Lopressor.  Tobacco abuse   Nicotine patch  Counseled cessation  Insommia Continue Ambien pm    Kelly Splinter MD [facility] pgr [phone] 07/08/2017, 3:02 PM   DVT prophylaxis:  Eliquis  Code Status:    Full  Family Communication:  Discussed with patient Disposition Plan: Expect patient to be discharged to home in 1- 2 days Consults called:    None Admission status: Tele OBs  Consults: -none      Recent Labs  Lab 07/07/17 0508  NA 139  K 3.5  CL 99*  CO2 28  GLUCOSE 132*  BUN 12  CREATININE 0.90  CALCIUM 8.4*   No results for input(s): AST, ALT, ALKPHOS, BILITOT, PROT, ALBUMIN in the last 168 hours. Recent Labs  Lab 07/07/17 0508 07/07/17 2326  WBC 23.2* 27.3*  NEUTROABS 18.4*  --   HGB 12.3 11.8*  HCT 40.0 38.1  MCV 90.3  89.0  PLT 402* 322   Iron/TIBC/Ferritin/ %Sat    Component Value Date/Time   IRON 20 (L) 08/08/2010 1040   TIBC 155 (L) 08/08/2010 1040   FERRITIN 8 (L) 08/08/2010 1040   IRONPCTSAT 13 (L) 08/08/2010 1040

## 2017-07-08 NOTE — Progress Notes (Signed)
Inpatient Diabetes Program Recommendations  AACE/ADA: New Consensus Statement on Inpatient Glycemic Control (2015)  Target Ranges:  Prepandial:   less than 140 mg/dL      Peak postprandial:   less than 180 mg/dL (1-2 hours)      Critically ill patients:  140 - 180 mg/dL   Results for April [name], April [name] (MRN [id number]) as of 07/08/2017 10:13  Ref. Range 07/07/2017 08:44 07/07/2017 12:58 07/07/2017 19:42 07/07/2017 21:42 07/08/2017 07:47  Glucose-Capillary Latest Ref Range: 65 - 99 mg/dL 284 (H) 293 (H) 280 (H) 281 (H) 245 (H)   Review of Glycemic Control  Diabetes history: None Outpatient Diabetes medications: None Current orders for Inpatient glycemic control: Novolog Resistant Correction 0-20 units tid   Inpatient Diabetes Program Recommendations:    Patient receiving IV Solumedrol 60 mg Q8 hours.  Consider adding Novolog HS scale. May have to add basal insulin if glucose trends continue to be elevated.  A1c increased from 5.6 % on 12/31 to 7.2% on 3/11  Patient has had 8 admissions in the last 6 months with multiple doses and tapers of steroids. Patient also Obese.  Consider Metformin at time of d/c.  Please consult if patient is diagnosed with Diabetes this admission.  Thanks,  Tama Headings RN, MSN, BC-ADM, [facility] Inpatient Diabetes Coordinator Team Pager [phone] (8a-5p)

## 2017-07-09 ENCOUNTER — Telehealth: Payer: Self-pay

## 2017-07-09 DIAGNOSIS — E0965 Drug or chemical induced diabetes mellitus with hyperglycemia: Secondary | ICD-10-CM

## 2017-07-09 DIAGNOSIS — I10 Essential (primary) hypertension: Secondary | ICD-10-CM

## 2017-07-09 DIAGNOSIS — J4541 Moderate persistent asthma with (acute) exacerbation: Secondary | ICD-10-CM

## 2017-07-09 LAB — GLUCOSE, CAPILLARY
GLUCOSE-CAPILLARY: 335 mg/dL — AB (ref 65–99)
GLUCOSE-CAPILLARY: 339 mg/dL — AB (ref 65–99)
Glucose-Capillary: 168 mg/dL — ABNORMAL HIGH (ref 65–99)
Glucose-Capillary: 358 mg/dL — ABNORMAL HIGH (ref 65–99)

## 2017-07-09 LAB — BASIC METABOLIC PANEL
Anion gap: 11 (ref 5–15)
BUN: 16 mg/dL (ref 6–20)
CALCIUM: 7.8 mg/dL — AB (ref 8.9–10.3)
CO2: 24 mmol/L (ref 22–32)
CREATININE: 0.88 mg/dL (ref 0.44–1.00)
Chloride: 99 mmol/L — ABNORMAL LOW (ref 101–111)
GFR calc non Af Amer: >60 mL/min (ref >60)
Glucose, Bld: 356 mg/dL — ABNORMAL HIGH (ref 65–99)
Potassium: 4.1 mmol/L (ref 3.5–5.1)
SODIUM: 134 mmol/L — AB (ref 135–145)

## 2017-07-09 LAB — CBC
HCT: 38.6 % (ref 36.0–46.0)
Hemoglobin: 12.1 g/dL (ref 12.0–15.0)
MCH: 27.7 pg (ref 26.0–34.0)
MCHC: 31.3 g/dL (ref 30.0–36.0)
MCV: 88.3 fL (ref 78.0–100.0)
PLATELETS: 356 10*3/uL (ref 150–400)
RBC: 4.37 MIL/uL (ref 3.87–5.11)
RDW: 18.1 % — AB (ref 11.5–15.5)
WBC: 24.7 10*3/uL — ABNORMAL HIGH (ref 4.0–10.5)

## 2017-07-09 MED ORDER — ALBUTEROL SULFATE HFA 108 (90 BASE) MCG/ACT IN AERS: 2.0000 | INHALATION_SPRAY | RESPIRATORY_TRACT | 2 refills | Status: DC | PRN

## 2017-07-09 MED ORDER — INSULIN ASPART 100 UNIT/ML ~~LOC~~ SOLN
3.0000 [IU] | Freq: Three times a day (TID) | SUBCUTANEOUS | Status: DC
  Administered 2017-07-09 – 2017-07-10 (×3): 3 [IU] via SUBCUTANEOUS

## 2017-07-09 MED ORDER — INSULIN DETEMIR 100 UNIT/ML ~~LOC~~ SOLN
15.0000 [IU] | Freq: Every day | SUBCUTANEOUS | Status: DC
  Administered 2017-07-09: 15 [IU] via SUBCUTANEOUS
  Filled 2017-07-09 (×2): qty 0.15

## 2017-07-09 MED ORDER — LIVING WELL WITH DIABETES BOOK
Freq: Once | Status: AC
Start: 2017-07-09 — End: 2017-07-09
  Administered 2017-07-09: 18:00:00
  Filled 2017-07-09: qty 1

## 2017-07-09 MED ORDER — METHYLPREDNISOLONE SODIUM SUCC 40 MG IJ SOLR
40.0000 mg | Freq: Two times a day (BID) | INTRAMUSCULAR | Status: DC
  Administered 2017-07-09 – 2017-07-10 (×2): 40 mg via INTRAVENOUS
  Filled 2017-07-09 (×2): qty 1

## 2017-07-09 MED ORDER — INSULIN ASPART 100 UNIT/ML ~~LOC~~ SOLN
4.0000 [IU] | Freq: Once | SUBCUTANEOUS | Status: AC
  Administered 2017-07-09: 4 [IU] via SUBCUTANEOUS

## 2017-07-09 MED ORDER — HYDRALAZINE HCL 20 MG/ML IJ SOLN: 10.0000 mg | Freq: Four times a day (QID) | INTRAMUSCULAR | Status: DC | PRN

## 2017-07-09 MED ORDER — MOMETASONE FURO-FORMOTEROL FUM 200-5 MCG/ACT IN AERO: 2.0000 | INHALATION_SPRAY | Freq: Two times a day (BID) | RESPIRATORY_TRACT | 2 refills | Status: DC

## 2017-07-09 NOTE — Progress Notes (Signed)
Pt. stated she does not use CPAP.

## 2017-07-09 NOTE — Telephone Encounter (Signed)
Met with the patient today. She stated that she continues to wait for the decision about her disability application. She asked about [facility] and was interested in having the services at home.  Voicemail message left for Heath Lark, [facility] Liaison informing her that the patient is now interested in case management services.

## 2017-07-09 NOTE — Telephone Encounter (Signed)
Spoke with pt. She is needing samples of Dulera and Albuterol HFA. Advised her that we do not have samples at this time. She requests that we send in prescriptions. This has been taken care of. Nothing further was needed.

## 2017-07-09 NOTE — Discharge Instructions (Signed)
Fingerstick glucose (sugar) goals for home: Before meals: 80-130 mg/dl 2-Hours after meals: less than 180 mg/dl Hemoglobin A1c goal: 7% or less  If your sugar levels are consistently greater than 250 mg/dl at home: Check sugars every 4 hours Drink plenty of water or other Non-Calorie beverages Take your medicines Call MD if sugars stay elevated longer than 12 hours

## 2017-07-09 NOTE — Telephone Encounter (Signed)
Call received from Heath Lark, [place] confirming that the patient is still receiving case management services from their agency. Marcelino Scot, RN has been working with the patient.

## 2017-07-09 NOTE — Progress Notes (Signed)
Pt refused treatment @ 12:00 AM, called for treatment at 1:45 AM. RT gave treatment and charted. RT will continue to monitor as needed.

## 2017-07-09 NOTE — Care Management Note (Addendum)
Case Management Note  Patient Details  Name: April [name] MRN: [id number] Date of Birth: [birth year]  Subjective/Objective:   Admitted with Acute respiratory failure with hypoxia, hx of  tobacco dependence, morbid obesity, OSA on CPAP, HTN, COPD, Depression, and multiple hospitalizations. From home alone. Independent with ADL's, no DME usage. Patient receives case management services from [facility] agency. Pt satae appl dis ans ss             Jinger Neighbors (Mother)     [phone]      PCP: [name]  Action/Plan: Transition to home when medically stable.Marland KitchenMarland KitchenCM following for disposition needs.      Expected Discharge Date:                  Expected Discharge Plan:  Home/Self Care  In-House Referral:   CSW  Discharge planning Services  CM Consult  Post Acute Care Choice:    Choice offered to:     DME Arranged:    DME Agency:     HH Arranged:    HH Agency:     Status of Service:  In process, will continue to follow  If discussed at Long Length of Stay Meetings, dates discussed:    Additional Comments:  Sharin Mons, RN 07/09/2017, 7:12 PM

## 2017-07-09 NOTE — Progress Notes (Signed)
PROGRESS NOTE                                                                                                                                                                                                             Patient Demographics:    April [name], is a 47 y.o. female, DOB - 10-25-1970, WFU:[id number]  Admit date - 07/07/2017   Admitting Physician [name], MD  Outpatient Primary MD for the patient is [name], MD  LOS - 1  Outpatient Specialists: Dr. Kimberlee Nearing (pulmonary)  Chief Complaint  Patient presents with  . Wheezing       Brief Narrative   47 year old morbidly obese female with a history of chronic asthma (on Xolair injections every 2 weeks) with multiple hospitalization for asthma exacerbation, frequently prednisone dependent, OSA on CPAP, diabetes mellitus (previously 3), major depressive disorder, anxiety, hypertension, OSA on CPAP presented with asthma exacerbation after being in close contact with sick family member.    Subjective:   Reports of breathing to be better but gets short of breath and wheezy on ambulation.   Assessment  & Plan :    Principal Problem:   Acute respiratory failure with hypoxia ([place]) Secondary to asthma exacerbation.  Currently maintaining sats on room but gets wheezy on minimal ambulation. On IV Solu-Medrol 60 mg every 8 hours, transition to 40 mg every 12 hours.  Continue scheduled DuoNeb and antitussives.  Empiric doxycycline. -Evaluate for home O2 need follow-up within discharge.  Continue incentive spirometry and Singulair. -Follow-up with her pulmonologist in 2 weeks.   Active Problems: Steroid-induced diabetes mellitus. CBG in 300 with high dose of IV Solu-Medrol.  (Does reduce today).  A1c of 7.2 criteria for diabetes (was prediabetic previously).  Will place her on Lantus 15 units at bedtime continue sliding scale coverage.  Needs metformin upon  discharge.  Hypertensive urgency On multiple home meds which have been resumed.  Still elevated.  Add as needed IV hydralazine.  Leukocytosis Likely reactive secondary to chronic steroid use.  Afebrile.  Improving.  On empiric doxycycline.   Atrial fibrillation on anticoagulation Status post [facility] ED on 05/2017.  Rate controlled on Cardizem.  Continue Eliquis.  Follows with Dr.Croitoru.   Diarrhea and depression Continue BuSpar and Zoloft.  Chronic diastolic CHF Asymptomatic and euvolemic.[name]  Continue Lasix, Aldactone and beta-blocker.  Morbid obesity with body mass index of 50.0-59.9 in adult (HCC)    OSA on CPAP    Code Status : Full code  Family Communication  : None at bedside  Disposition Plan  : Home possibly in 1-2 days if breathing better.  Barriers For Discharge : Active wheezing.  Consults  : None  Procedures  : None  DVT Prophylaxis  :  Lovenox -   Lab Results  Component Value Date   PLT 356 07/09/2017    Antibiotics  :   Anti-infectives (From admission, onward)   Start     Dose/Rate Route Frequency Ordered Stop   07/08/17 2000  doxycycline (VIBRA-TABS) tablet 100 mg     100 mg Oral Every 12 hours 07/08/17 1255     07/07/17 0800  levofloxacin (LEVAQUIN) IVPB 750 mg  Status:  Discontinued     750 mg 100 mL/hr over 90 Minutes Intravenous Every 24 hours 07/07/17 0732 07/07/17 0741   07/07/17 0800  doxycycline (VIBRAMYCIN) 100 mg in sodium chloride 0.9 % 250 mL IVPB  Status:  Discontinued     100 mg 125 mL/hr over 120 Minutes Intravenous Every 12 hours 07/07/17 0741 07/08/17 1255        Objective:   Vitals:   07/09/17 0446 07/09/17 0447 07/09/17 0727 07/09/17 1109  BP:  (!) 161/96    Pulse:  68    Resp:  (!) 22    Temp:  (!) 97.5 F (36.4 C)    TempSrc:  Oral    SpO2:  100% 98% 99%  Weight: (!) 160 kg (352 lb 11.2 oz)     Height:        Wt Readings from Last 3 Encounters:  07/09/17 (!) 160 kg (352 lb 11.2 oz)  06/25/17 (!) 160.4 kg (353  lb 9.6 oz)  06/07/17 (!) 157.8 kg (347 lb 14.2 oz)     Intake/Output Summary (Last 24 hours) at 07/09/2017 1159 Last data filed at 07/09/2017 1132 Gross per 24 hour  Intake 680 ml  Output -  Net 680 ml     Physical Exam  Gen: Morbidly obese female not in distress HEENT:  moist mucosa, supple neck Chest: Diffuse wheezing bilaterally CVS: N S1&S2, no murmurs,  GI: soft, NT, ND,  Musculoskeletal: warm, no edema     Data Review:    CBC Recent Labs  Lab 07/07/17 0508 07/07/17 2326 07/09/17 0817  WBC 23.2* 27.3* 24.7*  HGB 12.3 11.8* 12.1  HCT 40.0 38.1 38.6  PLT 402* 322 356  MCV 90.3 89.0 88.3  MCH 27.8 27.6 27.7  MCHC 30.8 31.0 31.3  RDW 18.4* 18.3* 18.1*  LYMPHSABS 3.2  --   --   MONOABS 1.6*  --   --   EOSABS 0.0  --   --   BASOSABS 0.0  --   --     Chemistries  Recent Labs  Lab 07/07/17 0508 07/09/17 0817  NA 139 134*  K 3.5 4.1  CL 99* 99*  CO2 28 24  GLUCOSE 132* 356*  BUN 12 16  CREATININE 0.90 0.88  CALCIUM 8.4* 7.8*   ------------------------------------------------------------------------------------------------------------------ No results for input(s): CHOL, HDL, LDLCALC, TRIG, CHOLHDL, LDLDIRECT in the last 72 hours.  Lab Results  Component Value Date   HGBA1C 7.2 (H) 07/07/2017   ------------------------------------------------------------------------------------------------------------------ No results for input(s): TSH, T4TOTAL, T3FREE, THYROIDAB in the last 72 hours.  Invalid input(s): FREET3 ------------------------------------------------------------------------------------------------------------------ No results for input(s): VITAMINB12, FOLATE, FERRITIN, TIBC, IRON, RETICCTPCT in the  last 72 hours.  Coagulation profile No results for input(s): INR, PROTIME in the last 168 hours.  No results for input(s): DDIMER in the last 72 hours.  Cardiac Enzymes No results for input(s): CKMB, TROPONINI, MYOGLOBIN in the last 168  hours.  Invalid input(s): CK ------------------------------------------------------------------------------------------------------------------    Component Value Date/Time   BNP 33.9 05/26/2017 0757    Inpatient Medications  Scheduled Meds: . apixaban  5 mg Oral BID  . busPIRone  7.5 mg Oral BID  . diltiazem  360 mg Oral Daily  . doxycycline  100 mg Oral Q12H  . fluticasone  2 spray Each Nare Daily  . furosemide  40 mg Oral Daily  . guaiFENesin  600 mg Oral BID  . insulin aspart  0-20 Units Subcutaneous TID WC  . ipratropium-albuterol  3 mL Nebulization Q4H  . loratadine  10 mg Oral Daily  . losartan  100 mg Oral Daily  . methylPREDNISolone (SOLU-MEDROL) injection  40 mg Intravenous Q12H  . metoprolol tartrate  100 mg Oral BID  . montelukast  10 mg Oral QHS  . nicotine  14 mg Transdermal Daily  . pantoprazole  40 mg Oral Daily  . sertraline  100 mg Oral Daily  . spironolactone  100 mg Oral Daily  . zolpidem  5 mg Oral QHS   Continuous Infusions: PRN Meds:.acetaminophen **OR** acetaminophen, albuterol, bisacodyl, butalbital-acetaminophen-caffeine, hydrALAZINE, HYDROcodone-acetaminophen, ketorolac, ondansetron **OR** ondansetron (ZOFRAN) IV, senna-docusate, traMADol  Micro Results Recent Results (from the past 240 hour(s))  MRSA PCR Screening     Status: None   Collection Time: 07/08/17  4:44 AM  Result Value Ref Range Status   MRSA by PCR NEGATIVE NEGATIVE Final    Comment:        The GeneXpert MRSA Assay (FDA approved for NASAL specimens only), is one component of a comprehensive MRSA colonization surveillance program. It is not intended to diagnose MRSA infection nor to guide or monitor treatment for MRSA infections. Performed at [facility], [place] [address] [place], [place] [address]     Radiology Reports Dg Chest 2 View  Result Date: 07/07/2017 CLINICAL DATA:  47 year old female with shortness of breath and wheezing. EXAM: CHEST - 2 VIEW  COMPARISON:  Chest radiograph dated 06/05/2017 FINDINGS: Minimal left lung base atelectatic changes. No focal consolidation, pleural effusion, or pneumothorax. Stable cardiac silhouette. No acute osseous pathology. IMPRESSION: No active cardiopulmonary disease. Electronically Signed   By: Anner Crete M.D.   On: 07/07/2017 03:48    Time Spent in minutes  25   Delisa Finck M.D on 07/09/2017 at 11:59 AM  Between 7am to 7pm - Pager - [phone]  After 7pm go to [email or web address] - password [facility]  [facility] -  Office  [phone]

## 2017-07-09 NOTE — Progress Notes (Signed)
Inpatient Diabetes Program Recommendations  AACE/ADA: New Consensus Statement on Inpatient Glycemic Control (2015)  Target Ranges:  Prepandial:   less than 140 mg/dL      Peak postprandial:   less than 180 mg/dL (1-2 hours)      Critically ill patients:  140 - 180 mg/dL   Results for April [name], April [name] (MRN [id number]) as of 07/09/2017 09:31  Ref. Range 07/08/2017 07:47 07/08/2017 11:49 07/08/2017 16:36 07/08/2017 21:27  Glucose-Capillary Latest Ref Range: 65 - 99 mg/dL 245 (H) 359 (H)  20 units Novolog  149 (H)  3 units Novolog  326 (H)   Results for April [name], April [name] (MRN [id number]) as of 07/09/2017 09:31  Ref. Range 07/09/2017 08:12  Glucose-Capillary Latest Ref Range: 65 - 99 mg/dL 335 (H)   Results for April [name], April [name] (MRN [id number]) as of 07/09/2017 09:31  Ref. Range 04/28/2017 04:29 07/07/2017 08:45  Hemoglobin A1C Latest Ref Range: 4.8 - 5.6 % 5.6 7.2 (H)    Diabetes history: None  Outpatient Diabetes medications: None  Current Orders: Novolog Resistant Correction Scale/ SSI (0-20 units) TID AC      A1c increased from 5.6 % on 04/28/17 to 7.2% on 07/07/17.  Patient has had 8 admissions in the last 6 months with multiple doses and tapers of steroids. Patient also Obese.  Consider Metformin at time of d/c.  Note patient getting Solumedrol 60 mg Q8H.  DM Coordinator spoke with patient yesterday about elevated glucose and A1c levels.    MD- Please consider the following in-hospital insulin adjustments while patient getting IV steroids:  1. Start Lantus 15 units daily (0.1 units/kg dosing)  2. Start Novolog Meal Coverage: Novolog 4 units TID with meals (hold if pt eats <50% of meal)  3. Consider Metformin at time of d/c- Metformin 500 mg BID.  Please also consider providing patient with glucometer and supplies at time of discharge.     --Will follow patient during hospitalization--  Wyn Quaker RN, MSN, CDE Diabetes Coordinator Inpatient Glycemic Control  Team Team Pager: [phone] (8a-5p)

## 2017-07-09 NOTE — Progress Notes (Addendum)
MD- please make sure to give pt a Rx for CBG and supplies at time of d/c:  Order # [id number]  Met with pt again today.  Discussed current A1c results and current CBGs and insulin orders.  Discussed with pt that Dr. Clementeen Graham mentioned in his notes that he may send pt home on Metformin.  Pt stated she has taken Metformin in the past.  Discussed how Metformin works and side effects, when to take, etc.   Also discussed checking CBGs at home.  Encouraged pt to check her CBGs BID at home (fasting and vary the second check of the day either before a meal or two hours after a meal).  Reviewed CBG goals at home.  Reviewed what to do at home when CBGs are elevated.  Encouraged pt to call her PCP if she has elevated CBGs for more than 12 hours at home.  Have asked nursing staff to please allow pt to practice checking her CBGs with the hospital lancets before d/c.  Will order DM educational booklet for pt to have prior to discharge.  Will also ask RD to visit with pt to discuss healthy eating at home for diabates.     --Will follow patient during hospitalization--  Wyn Quaker RN, MSN, CDE Diabetes Coordinator Inpatient Glycemic Control Team Team Pager: [phone] (8a-5p)

## 2017-07-10 ENCOUNTER — Ambulatory Visit: Payer: Medicaid Other | Admitting: Cardiology

## 2017-07-10 DIAGNOSIS — E0865 Diabetes mellitus due to underlying condition with hyperglycemia: Secondary | ICD-10-CM

## 2017-07-10 LAB — GLUCOSE, CAPILLARY
Glucose-Capillary: 213 mg/dL — ABNORMAL HIGH (ref 65–99)
Glucose-Capillary: 325 mg/dL — ABNORMAL HIGH (ref 65–99)
Glucose-Capillary: 222 mg/dL — ABNORMAL HIGH (ref 65–99)
Glucose-Capillary: 251 mg/dL — ABNORMAL HIGH (ref 65–99)

## 2017-07-10 LAB — LACTIC ACID, PLASMA: LACTIC ACID, VENOUS: 2.1 mmol/L — AB (ref 0.5–1.9)

## 2017-07-10 MED ORDER — INSULIN DETEMIR 100 UNIT/ML ~~LOC~~ SOLN
20.0000 [IU] | Freq: Every day | SUBCUTANEOUS | Status: DC
  Administered 2017-07-10 – 2017-07-11 (×2): 20 [IU] via SUBCUTANEOUS
  Filled 2017-07-10 (×3): qty 0.2

## 2017-07-10 MED ORDER — INSULIN ASPART 100 UNIT/ML ~~LOC~~ SOLN
6.0000 [IU] | Freq: Three times a day (TID) | SUBCUTANEOUS | Status: DC
  Administered 2017-07-10 – 2017-07-12 (×6): 6 [IU] via SUBCUTANEOUS

## 2017-07-10 MED ORDER — IPRATROPIUM-ALBUTEROL 0.5-2.5 (3) MG/3ML IN SOLN
3.0000 mL | Freq: Four times a day (QID) | RESPIRATORY_TRACT | Status: DC
  Administered 2017-07-10 – 2017-07-12 (×9): 3 mL via RESPIRATORY_TRACT
  Filled 2017-07-10 (×9): qty 3

## 2017-07-10 MED ORDER — APIXABAN 5 MG PO TABS: 5.0000 mg | ORAL_TABLET | Freq: Two times a day (BID) | ORAL | 0 refills | Status: DC

## 2017-07-10 MED ORDER — PREDNISONE 20 MG PO TABS
40.0000 mg | ORAL_TABLET | Freq: Every day | ORAL | Status: DC
  Administered 2017-07-11 – 2017-07-12 (×2): 40 mg via ORAL
  Filled 2017-07-10 (×2): qty 2

## 2017-07-10 NOTE — Progress Notes (Signed)
Inpatient Diabetes Program Recommendations  AACE/ADA: New Consensus Statement on Inpatient Glycemic Control (2015)  Target Ranges:  Prepandial:   less than 140 mg/dL      Peak postprandial:   less than 180 mg/dL (1-2 hours)      Critically ill patients:  140 - 180 mg/dL   Results for [name], April N (MRN [id number]) as of 07/10/2017 12:33  Ref. Range 07/10/2017 07:25 07/10/2017 11:34  Glucose-Capillary Latest Ref Range: 65 - 99 mg/dL 213 (H) 325 (H)   Review of Glycemic Control  Diabetes history:None  Outpatient Diabetes medications:None  Current Orders: Novolog Resistant Correction Scale/ SSI (0-20 units) TID AC, Lantus 15 units, Novolog 3 units tid meal coverage.  Inpatient Diabetes Program Recommendations:    Glucose trends in the 200-300 range still on Lantus 15 units and postprandial glucose increases throughout the day. Please consider increasing Lantus to 20 units and meal coverage to Novolog 6 units tid.  Glucose meter kit (order # [id number]), MD considering Metformin at time of d/c.  Thanks,    RN, MSN, BC-ADM, PCCN Inpatient Diabetes Coordinator Team Pager [phone] (8a-5p) 

## 2017-07-10 NOTE — Progress Notes (Signed)
CRITICAL VALUE ALERT  Critical Value:  Lactic acid 2.1  Date & Time Notied:  07/10/2017 1711  Provider Notified: Dhungel  Orders Received/Actions taken: Ordering another lactic acid

## 2017-07-10 NOTE — Progress Notes (Signed)
PROGRESS NOTE                                                                                                                                                                                                             Patient Demographics:    April [name], is a 47 y.o. female, DOB - 10-17-1970, PNT:[id number]  Admit date - 07/07/2017   Admitting Physician [name], MD  Outpatient Primary MD for the patient is [name], MD  LOS - 2  Outpatient Specialists: Dr. Annamaria Boots (pulmonary)  Chief Complaint  Patient presents with  . Wheezing       Brief Narrative   47 year old morbidly obese female with a history of chronic asthma (on Xolair injections every 2 weeks) with multiple hospitalization for asthma exacerbation, frequently prednisone dependent, OSA on CPAP, diabetes mellitus (previously 3), major depressive disorder, anxiety, hypertension, OSA on CPAP presented with asthma exacerbation after being in close contact with sick family member.    Subjective:   Wheezing and dyspnea improving but still getting short of breath on exertion. Feels 50% of baseline   Assessment  & Plan :    Principal Problem:   Acute respiratory failure with hypoxia (HCC) Secondary to asthma exacerbation.  Currently maintaining sats on room but still wheezy on minimal ambulation. Much improved from yesterday. On IV Solu-Medrol  40 mg every 12 hours. Transition to oral prednisone in am and discharge on taper over the next 8-10 days to her home dose of 20 mg daily.  Continue scheduled DuoNeb and antitussives.  Empiric doxycycline. -Evaluate for home O2 before discharge.  Continue incentive spirometry and Singulair. -Follow-up with her pulmonologist in 2 weeks. ( missed appointment with Dr Janee Morn PA this week as she was hospitalized.)   Active Problems: Steroid-induced diabetes mellitus. CBG in 300 with IV Solu-Medrol despite reducing  dose today.   A1c of 7.2 criteria for diabetes (was prediabetic previously).  placeed on Lantus 15 units at bedtime continue sliding scale coverage. Added premeal aspart ( dose increased to 6u tid).  Needs metformin upon discharge. ( check lactate today since it was elevated on admission) )   Hypertensive urgency On multiple home meds which have been resumed.  Still elevated.  Add as needed IV hydralazine.  Leukocytosis Likely reactive secondary to chronic steroid use.  Afebrile.  Improving.  On empiric  doxycycline.   Atrial fibrillation on anticoagulation Status post cardioversion on 05/2017.  Rate controlled on Cardizem.  Continue Eliquis ( wrote 30 day prescription since pt ran out of it.).   Follows with Dr.Croitoru.   Diarrhea and depression Continue BuSpar and Zoloft.  Chronic diastolic CHF Asymptomatic and euvolemic.[name]  Continue Lasix, Aldactone and beta-blocker.    Morbid obesity with body mass index of 50.0-59.9 in adult (HCC)    OSA on CPAP    Code Status : Full code  Family Communication  : None at bedside  Disposition Plan  : Home possibly tomorrow if dyspnea and wheezing better  Barriers For Discharge : Active wheezing, still not anywhere close to baseline.  Consults  : None  Procedures  : None  DVT Prophylaxis  :  Lovenox -   Lab Results  Component Value Date   PLT 356 07/09/2017    Antibiotics  :   Anti-infectives (From admission, onward)   Start     Dose/Rate Route Frequency Ordered Stop   07/08/17 2000  doxycycline (VIBRA-TABS) tablet 100 mg     100 mg Oral Every 12 hours 07/08/17 1255     07/07/17 0800  levofloxacin (LEVAQUIN) IVPB 750 mg  Status:  Discontinued     750 mg 100 mL/hr over 90 Minutes Intravenous Every 24 hours 07/07/17 0732 07/07/17 0741   07/07/17 0800  doxycycline (VIBRAMYCIN) 100 mg in sodium chloride 0.9 % 250 mL IVPB  Status:  Discontinued     100 mg 125 mL/hr over 120 Minutes Intravenous Every 12 hours 07/07/17 0741  07/08/17 1255        Objective:   Vitals:   07/10/17 0500 07/10/17 0843 07/10/17 0943 07/10/17 1358  BP: (!) 120/58  (!) 160/92 139/88  Pulse: 72  77 67  Resp: 18   20  Temp: 97.9 F (36.6 C)   97.6 F (36.4 C)  TempSrc: Oral   Oral  SpO2: 100% 95%  100%  Weight: (!) 160 kg (352 lb 11.2 oz)     Height:        Wt Readings from Last 3 Encounters:  07/10/17 (!) 160 kg (352 lb 11.2 oz)  06/25/17 (!) 160.4 kg (353 lb 9.6 oz)  06/07/17 (!) 157.8 kg (347 lb 14.2 oz)     Intake/Output Summary (Last 24 hours) at 07/10/2017 1521 Last data filed at 07/10/2017 1300 Gross per 24 hour  Intake 666 ml  Output -  Net 666 ml     Physical Exam Gen: morbidly obese female in nAD HEENT: moist mucosa , supple neck Chest: diffuse wheezing b/l ( better from yesterday) CVS: NS1&S2, no murmurs GI: soft, NT, ND Musculoskeletal: warm, no edema      Data Review:    CBC Recent Labs  Lab 07/07/17 0508 07/07/17 2326 07/09/17 0817  WBC 23.2* 27.3* 24.7*  HGB 12.3 11.8* 12.1  HCT 40.0 38.1 38.6  PLT 402* 322 356  MCV 90.3 89.0 88.3  MCH 27.8 27.6 27.7  MCHC 30.8 31.0 31.3  RDW 18.4* 18.3* 18.1*  LYMPHSABS 3.2  --   --   MONOABS 1.6*  --   --   EOSABS 0.0  --   --   BASOSABS 0.0  --   --     Chemistries  Recent Labs  Lab 07/07/17 0508 07/09/17 0817  NA 139 134*  K 3.5 4.1  CL 99* 99*  CO2 28 24  GLUCOSE 132* 356*  BUN 12 16  CREATININE 0.90  0.88  CALCIUM 8.4* 7.8*   ------------------------------------------------------------------------------------------------------------------ No results for input(s): CHOL, HDL, LDLCALC, TRIG, CHOLHDL, LDLDIRECT in the last 72 hours.  Lab Results  Component Value Date   HGBA1C 7.2 (H) 07/07/2017   ------------------------------------------------------------------------------------------------------------------ No results for input(s): TSH, T4TOTAL, T3FREE, THYROIDAB in the last 72 hours.  Invalid input(s):  FREET3 ------------------------------------------------------------------------------------------------------------------ No results for input(s): VITAMINB12, FOLATE, FERRITIN, TIBC, IRON, RETICCTPCT in the last 72 hours.  Coagulation profile No results for input(s): INR, PROTIME in the last 168 hours.  No results for input(s): DDIMER in the last 72 hours.  Cardiac Enzymes No results for input(s): CKMB, TROPONINI, MYOGLOBIN in the last 168 hours.  Invalid input(s): CK ------------------------------------------------------------------------------------------------------------------    Component Value Date/Time   BNP 33.9 05/26/2017 0757    Inpatient Medications  Scheduled Meds: . apixaban  5 mg Oral BID  . busPIRone  7.5 mg Oral BID  . diltiazem  360 mg Oral Daily  . doxycycline  100 mg Oral Q12H  . fluticasone  2 spray Each Nare Daily  . furosemide  40 mg Oral Daily  . guaiFENesin  600 mg Oral BID  . insulin aspart  0-20 Units Subcutaneous TID WC  . insulin aspart  6 Units Subcutaneous TID WC  . insulin detemir  15 Units Subcutaneous QHS  . ipratropium-albuterol  3 mL Nebulization Q6H  . loratadine  10 mg Oral Daily  . losartan  100 mg Oral Daily  . methylPREDNISolone (SOLU-MEDROL) injection  40 mg Intravenous Q12H  . metoprolol tartrate  100 mg Oral BID  . montelukast  10 mg Oral QHS  . nicotine  14 mg Transdermal Daily  . pantoprazole  40 mg Oral Daily  . sertraline  100 mg Oral Daily  . spironolactone  100 mg Oral Daily  . zolpidem  5 mg Oral QHS   Continuous Infusions: PRN Meds:.acetaminophen **OR** acetaminophen, albuterol, bisacodyl, butalbital-acetaminophen-caffeine, hydrALAZINE, HYDROcodone-acetaminophen, ketorolac, ondansetron **OR** ondansetron (ZOFRAN) IV, senna-docusate, traMADol  Micro Results Recent Results (from the past 240 hour(s))  MRSA PCR Screening     Status: None   Collection Time: 07/08/17  4:44 AM  Result Value Ref Range Status   MRSA by PCR  NEGATIVE NEGATIVE Final    Comment:        The GeneXpert MRSA Assay (FDA approved for NASAL specimens only), is one component of a comprehensive MRSA colonization surveillance program. It is not intended to diagnose MRSA infection nor to guide or monitor treatment for MRSA infections. Performed at [facility], [place] [address], [place],  [address]     Radiology Reports Dg Chest 2 View  Result Date: 07/07/2017 CLINICAL DATA:  47 year old female with shortness of breath and wheezing. EXAM: CHEST - 2 VIEW COMPARISON:  Chest radiograph dated 06/05/2017 FINDINGS: Minimal left lung base atelectatic changes. No focal consolidation, pleural effusion, or pneumothorax. Stable cardiac silhouette. No acute osseous pathology. IMPRESSION: No active cardiopulmonary disease. Electronically Signed   By: Anner Crete M.D.   On: 07/07/2017 03:48    Time Spent in minutes  25   Calise Dunckel M.D on 07/10/2017 at 3:21 PM  Between 7am to 7pm - Pager - [phone]  After 7pm go to [email or web address] - password [facility]  [facility] -  Office  [phone]

## 2017-07-10 NOTE — Progress Notes (Signed)
Pt refuse CPAP for the night.  

## 2017-07-10 NOTE — Plan of Care (Signed)
  RD consulted for nutrition education regarding diabetes.   Lab Results  Component Value Date   HGBA1C 7.2 (H) 07/07/2017    RD provided "Carbohydrate Counting for People with Diabetes" handout from the Academy of Nutrition and Dietetics. Discussed different food groups and their effects on blood sugar, emphasizing carbohydrate-containing foods. Provided list of carbohydrates and recommended serving sizes of common foods.  Discussed importance of controlled and consistent carbohydrate intake throughout the day. Provided examples of ways to balance meals/snacks and encouraged intake of high-fiber, whole grain complex carbohydrates. Teach back method used.  Expect fair compliance.  Body mass index is 56.93 kg/m. Pt meets criteria for obese class III based on current BMI.  Current diet order is heart healthy/carb mod, patient is consuming approximately 75-100% of meals at this time. Labs and medications reviewed. No further nutrition interventions warranted at this time. RD contact information provided. If additional nutrition issues arise, please re-consult RD.  Satira Anis. Encarnacion Scioneaux, MS, RD LDN Inpatient Clinical Dietitian Pager [phone]

## 2017-07-10 NOTE — Plan of Care (Signed)
  Progressing Clinical Measurements: Diagnostic test results will improve 07/10/2017 0319 - Progressing by Verne Grain, RN CBG elevated at HS 348, notified team. Given novolog 4units per team Respiratory complications will improve 07/10/2017 0319 - Progressing by Verne Grain, RN  Monitor for shortness of breath

## 2017-07-11 LAB — GLUCOSE, CAPILLARY
GLUCOSE-CAPILLARY: 203 mg/dL — AB (ref 65–99)
Glucose-Capillary: 194 mg/dL — ABNORMAL HIGH (ref 65–99)
Glucose-Capillary: 197 mg/dL — ABNORMAL HIGH (ref 65–99)
Glucose-Capillary: 203 mg/dL — ABNORMAL HIGH (ref 65–99)

## 2017-07-11 MED ORDER — ALUM & MAG HYDROXIDE-SIMETH 200-200-20 MG/5ML PO SUSP
15.0000 mL | Freq: Four times a day (QID) | ORAL | Status: DC | PRN
  Administered 2017-07-11 – 2017-07-12 (×2): 15 mL via ORAL
  Filled 2017-07-11 (×2): qty 30

## 2017-07-11 NOTE — Plan of Care (Signed)
Patient ambulated 300 ft with 02 sats starting at 91% and continued to rise up to 93% with wheezing heard by nurse when walking. 02 sats remained steady at 93% however Pt was SOB and had to stop once to get her breath before getting back in bed. Would recommend pt stay one more night and reevaluate tomorrow. Will page MD to let him know.

## 2017-07-11 NOTE — Plan of Care (Signed)
Progressing

## 2017-07-11 NOTE — Progress Notes (Addendum)
PROGRESS NOTE    April [name]  HYW:[id number] DOB: 1971-01-31 DOA: 07/07/2017 PCP: [name], MD   Brief Narrative:  47 year old morbidly obese female with a history of chronic asthma (on Xolair injections every 2 weeks) with multiple hospitalization for asthma exacerbation, frequently prednisone dependent, OSA on CPAP, diabetes mellitus (previously prediabetic), major depressive disorder, anxiety, hypertension, OSA on CPAP presented with asthma exacerbation after being in close contact with sick family member.  Assessment & Plan:   Principal Problem:   Acute respiratory failure with hypoxia (HCC) Active Problems:   Morbid obesity with body mass index of 50.0-59.9 in adult [facility] [place])   Essential hypertension   Cervical back pain with evidence of disc disease   OSA on CPAP   Primary insomnia   Knee pain, bilateral   Tobacco abuse   Leukocytosis   Seasonal allergic rhinitis   Prediabetes   GERD (gastroesophageal reflux disease)   Anxiety and depression   Malignant hypertension due to primary aldosteronism (HCC)   Normocytic anemia   Moderate persistent asthma with exacerbation   Chronic diastolic CHF (congestive heart failure) ([place])   COPD with acute exacerbation ([place])   New onset atrial fibrillation (HCC)   Hyperglycemia, drug-induced   Acute Hypoxic Respiratory Failure 2/2 Asthma Exacerbation:  Currently on room air, but persistently wheezing and with significant DOE.  Steroids transitioned to PO from IV today.  Will need taper when ready for DC (takes 20 mg chronically) Continue duonebs, albuterol prn Continue doxycycline Continue IS Pt has appt with Dr. Annamaria Boots coming up on Monday Due to persistent doe, wheezing, will continue to monitor another day  Steroid-induced diabetes mellitus. CBG in 300 with IV Solu-Medrol despite reducing dose today.    A1c of 7.2 criteria for diabetes (was prediabetic previously).   Placeed on levemir 20 units at bedtime continue sliding  scale coverage. Added premeal aspart ( dose increased to 6u tid).  Holding metformin.   HTN Continue lasix, aldactone, metoprolol, losartan CTM  Leukocytosis Likely reactive secondary to chronic steroid use.  Afebrile.  Improving.  On empiric doxycycline.  Atrial fibrillation on anticoagulation Status post cardioversion on 05/2017.  Rate controlled on Cardizem.  Continue Eliquis (previous provider wrote 30 day prescription since pt ran out of it.).   Follows with Dr.Croitoru.  Diarrhea and depression Continue BuSpar and Zoloft.  Chronic diastolic CHF Asymptomatic and euvolemic.[name]  Continue Lasix, Aldactone and beta-blocker.  Morbid obesity with body mass index of 50.0-59.9 in adult (HCC)  OSA on CPAP  DVT prophylaxis: eliquis Code Status: full  Family Communication: none at bedside Disposition Plan: pending further improvement   Consultants:   none  Procedures:   none  Antimicrobials:  Anti-infectives (From admission, onward)   Start     Dose/Rate Route Frequency Ordered Stop   07/08/17 2000  doxycycline (VIBRA-TABS) tablet 100 mg     100 mg Oral Every 12 hours 07/08/17 1255     07/07/17 0800  levofloxacin (LEVAQUIN) IVPB 750 mg  Status:  Discontinued     750 mg 100 mL/hr over 90 Minutes Intravenous Every 24 hours 07/07/17 0732 07/07/17 0741   07/07/17 0800  doxycycline (VIBRAMYCIN) 100 mg in sodium chloride 0.9 % 250 mL IVPB  Status:  Discontinued     100 mg 125 mL/hr over 120 Minutes Intravenous Every 12 hours 07/07/17 0741 07/08/17 1255      Subjective: Doesn't feel quite ready to go home yet. Still with SOB and wheezing  Objective: Vitals:   07/11/17 0711 07/11/17  2353 07/11/17 1353 07/11/17 1400  BP:    129/75  Pulse:  71 65 64  Resp:  17 18 18   Temp:    97.8 F (36.6 C)  TempSrc:    Axillary  SpO2:  96% 97% 100%  Weight: (!) 161.4 kg (355 lb 13.2 oz)     Height:        Intake/Output Summary (Last 24 hours) at 07/11/2017 1900 Last data  filed at 07/11/2017 1312 Gross per 24 hour  Intake 1288 ml  Output -  Net 1288 ml   Filed Weights   07/09/17 0446 07/10/17 0500 07/11/17 0711  Weight: (!) 160 kg (352 lb 11.2 oz) (!) 160 kg (352 lb 11.2 oz) (!) 161.4 kg (355 lb 13.2 oz)    Examination:  General exam: Appears calm and comfortable.  Obese.  Respiratory system: Diffuse wheezing, especially after activity.  Mildly increased WOB.  Cardiovascular system: S1 & S2 heard, RRR. No JVD, murmurs, rubs, gallops or clicks. No pedal edema. Gastrointestinal system: Abdomen is nondistended, soft and nontender. No organomegaly or masses felt. Normal bowel sounds heard. Central nervous system: Alert and oriented. No focal neurological deficits. Extremities: Symmetric 5 x 5 power. Skin: No rashes, lesions or ulcers Psychiatry: Judgement and insight appear normal. Mood & affect appropriate.     Data Reviewed: I have personally reviewed following labs and imaging studies  CBC: Recent Labs  Lab 07/07/17 0508 07/07/17 2326 07/09/17 0817  WBC 23.2* 27.3* 24.7*  NEUTROABS 18.4*  --   --   HGB 12.3 11.8* 12.1  HCT 40.0 38.1 38.6  MCV 90.3 89.0 88.3  PLT 402* 322 614   Basic Metabolic Panel: Recent Labs  Lab 07/07/17 0508 07/09/17 0817  NA 139 134*  K 3.5 4.1  CL 99* 99*  CO2 28 24  GLUCOSE 132* 356*  BUN 12 16  CREATININE 0.90 0.88  CALCIUM 8.4* 7.8*   GFR: Estimated Creatinine Clearance: 128.9 mL/min (by C-G formula based on SCr of 0.88 mg/dL). Liver Function Tests: No results for input(s): AST, ALT, ALKPHOS, BILITOT, PROT, ALBUMIN in the last 168 hours. No results for input(s): LIPASE, AMYLASE in the last 168 hours. No results for input(s): AMMONIA in the last 168 hours. Coagulation Profile: No results for input(s): INR, PROTIME in the last 168 hours. Cardiac Enzymes: No results for input(s): CKTOTAL, CKMB, CKMBINDEX, TROPONINI in the last 168 hours. BNP (last 3 results) No results for input(s): PROBNP in the  last 8760 hours. HbA1C: No results for input(s): HGBA1C in the last 72 hours. CBG: Recent Labs  Lab 07/10/17 1647 07/10/17 2156 07/11/17 0821 07/11/17 1249 07/11/17 1628  GLUCAP 222* 251* 194* 203* 197*   Lipid Profile: No results for input(s): CHOL, HDL, LDLCALC, TRIG, CHOLHDL, LDLDIRECT in the last 72 hours. Thyroid Function Tests: No results for input(s): TSH, T4TOTAL, FREET4, T3FREE, THYROIDAB in the last 72 hours. Anemia Panel: No results for input(s): VITAMINB12, FOLATE, FERRITIN, TIBC, IRON, RETICCTPCT in the last 72 hours. Sepsis Labs: Recent Labs  Lab 07/07/17 0845 07/07/17 2326 07/10/17 1550  PROCALCITON <0.10  --   --   LATICACIDVEN 2.8* 2.5* 2.1*    Recent Results (from the past 240 hour(s))  MRSA PCR Screening     Status: None   Collection Time: 07/08/17  4:44 AM  Result Value Ref Range Status   MRSA by PCR NEGATIVE NEGATIVE Final    Comment:        The GeneXpert MRSA Assay (FDA approved for NASAL  specimens only), is one component of a comprehensive MRSA colonization surveillance program. It is not intended to diagnose MRSA infection nor to guide or monitor treatment for MRSA infections. Performed at [facility], [place] [address], [place], [place] [address]          Radiology Studies: No results found.      Scheduled Meds: . apixaban  5 mg Oral BID  . busPIRone  7.5 mg Oral BID  . diltiazem  360 mg Oral Daily  . doxycycline  100 mg Oral Q12H  . fluticasone  2 spray Each Nare Daily  . furosemide  40 mg Oral Daily  . guaiFENesin  600 mg Oral BID  . insulin aspart  0-20 Units Subcutaneous TID WC  . insulin aspart  6 Units Subcutaneous TID WC  . insulin detemir  20 Units Subcutaneous QHS  . ipratropium-albuterol  3 mL Nebulization Q6H  . loratadine  10 mg Oral Daily  . losartan  100 mg Oral Daily  . metoprolol tartrate  100 mg Oral BID  . montelukast  10 mg Oral QHS  . nicotine  14 mg Transdermal Daily  . pantoprazole  40 mg  Oral Daily  . predniSONE  40 mg Oral Q breakfast  . sertraline  100 mg Oral Daily  . spironolactone  100 mg Oral Daily  . zolpidem  5 mg Oral QHS   Continuous Infusions:   LOS: 3 days    Time spent: over 30 min    Fayrene Helper, MD [facility] Pager [phone]  If 7PM-7AM, please contact night-coverage [email or web address] Password [email or web address] 07/11/2017, 7:00 PM

## 2017-07-11 NOTE — Progress Notes (Signed)
Notified Bohenhemir, NP that pt requesting medication for heartburn. Will continue to monitor pt. Ranelle Oyster, RN

## 2017-07-11 NOTE — Progress Notes (Signed)
Rt at bedside.  Talked to pt about CPAP.  Pt stated she does not want to wear CPAP tonight.  RT will continue to monitor.

## 2017-07-12 LAB — CBC
HCT: 38.3 % (ref 36.0–46.0)
Hemoglobin: 12.1 g/dL (ref 12.0–15.0)
MCH: 27.6 pg (ref 26.0–34.0)
MCHC: 31.6 g/dL (ref 30.0–36.0)
MCV: 87.2 fL (ref 78.0–100.0)
Platelets: 276 10*3/uL (ref 150–400)
RBC: 4.39 MIL/uL (ref 3.87–5.11)
RDW: 17.6 % — AB (ref 11.5–15.5)
WBC: 25.4 10*3/uL — ABNORMAL HIGH (ref 4.0–10.5)

## 2017-07-12 LAB — BASIC METABOLIC PANEL
Anion gap: 8 (ref 5–15)
BUN: 17 mg/dL (ref 6–20)
CALCIUM: 8.1 mg/dL — AB (ref 8.9–10.3)
CO2: 28 mmol/L (ref 22–32)
CREATININE: 0.96 mg/dL (ref 0.44–1.00)
Chloride: 101 mmol/L (ref 101–111)
GFR calc non Af Amer: >60 mL/min (ref >60)
Glucose, Bld: 157 mg/dL — ABNORMAL HIGH (ref 65–99)
Potassium: 3.6 mmol/L (ref 3.5–5.1)
Sodium: 137 mmol/L (ref 135–145)

## 2017-07-12 LAB — GLUCOSE, CAPILLARY
GLUCOSE-CAPILLARY: 178 mg/dL — AB (ref 65–99)
Glucose-Capillary: 123 mg/dL — ABNORMAL HIGH (ref 65–99)

## 2017-07-12 LAB — LACTIC ACID, PLASMA: LACTIC ACID, VENOUS: 1.4 mmol/L (ref 0.5–1.9)

## 2017-07-12 MED ORDER — PREDNISONE 20 MG PO TABS: ORAL_TABLET | ORAL | 0 refills | Status: AC

## 2017-07-12 MED ORDER — ALBUTEROL SULFATE HFA 108 (90 BASE) MCG/ACT IN AERS: 2.0000 | INHALATION_SPRAY | RESPIRATORY_TRACT | 0 refills | Status: DC | PRN

## 2017-07-12 MED ORDER — INSULIN STARTER KIT- PEN NEEDLES (ENGLISH)
1.0000 | Freq: Once | Status: DC
  Filled 2017-07-12: qty 1

## 2017-07-12 MED ORDER — INSULIN GLARGINE 100 UNITS/ML SOLOSTAR PEN: 15.0000 [IU] | PEN_INJECTOR | Freq: Every day | SUBCUTANEOUS | 0 refills | Status: DC

## 2017-07-12 MED ORDER — IPRATROPIUM-ALBUTEROL 0.5-2.5 (3) MG/3ML IN SOLN
3.0000 mL | Freq: Three times a day (TID) | RESPIRATORY_TRACT | Status: DC
  Administered 2017-07-12: 3 mL via RESPIRATORY_TRACT
  Filled 2017-07-12: qty 3

## 2017-07-12 MED ORDER — BLOOD GLUCOSE MONITOR KIT: PACK | 0 refills | Status: DC

## 2017-07-12 MED ORDER — APIXABAN 5 MG PO TABS: 5.0000 mg | ORAL_TABLET | Freq: Two times a day (BID) | ORAL | 0 refills | Status: DC

## 2017-07-12 NOTE — Progress Notes (Signed)
Patient ambulated over 300 ft with 02 sats steady at 95%. Experienced some SOB but overall much better than yesterday.  Spoke with diabetes coordinator she is not in house today but recommended insulin pen teaching and to order a insulin starter kit.

## 2017-07-12 NOTE — Discharge Summary (Signed)
Physician Discharge Summary  April [name]:[id number] DOB: January 07, 1971 DOA: 07/07/2017  PCP: [name], MD  Admit date: 07/07/2017 Discharge date: 07/12/2017  Time spent: over 30 minutes  Recommendations for Outpatient Follow-up:  1. Follow up outpatient CBC/CMp 2. Follow up respiratory status 3. Discharged with 15 units lantus daily for steroid induced DM.  Pt prescribed glucometer and instructed to follow BG QID and follow up with PCP for continued adjustment to regimen.  May not need long term depending on plan for steroid regimen 4. Follow up plan for long term steroids with pulmonology 5. Refilled some home meds she needed, will need f/u with PCP for additional refills post discharge   Discharge Diagnoses:  Principal Problem:   Acute respiratory failure with hypoxia ([place]) Active Problems:   Morbid obesity with body mass index of 50.0-59.9 in adult [facility])   Essential hypertension   Cervical back pain with evidence of disc disease   OSA on CPAP   Primary insomnia   Knee pain, bilateral   Tobacco abuse   Leukocytosis   Seasonal allergic rhinitis   Prediabetes   GERD (gastroesophageal reflux disease)   Anxiety and depression   Malignant hypertension due to primary aldosteronism (HCC)   Normocytic anemia   Moderate persistent asthma with exacerbation   Chronic diastolic CHF (congestive heart failure) (HCC)   COPD with acute exacerbation ([place])   New onset atrial fibrillation (HCC)   Hyperglycemia, drug-induced   Discharge Condition: stable  Diet recommendation: heart healthy  Filed Weights   07/09/17 0446 07/10/17 0500 07/11/17 0711  Weight: (!) 160 kg (352 lb 11.2 oz) (!) 160 kg (352 lb 11.2 oz) (!) 161.4 kg (355 lb 13.2 oz)    History of present illness:  47 year old morbidly obese female with a history of chronic asthma (on Xolair injections every 2 weeks) with multiple hospitalization for asthma exacerbation, frequently prednisone dependent, OSA on CPAP,  diabetes mellitus (previously prediabetic), major depressive disorder, anxiety, hypertension, OSA on CPAP presented with asthma exacerbation after being in close contact with sick family member.  She was streated with steroids, antibiotics, and nebs and has gradually improved over the past few days.   Hospital Course:  Acute Hypoxic Respiratory Failure 2/2 Asthma Exacerbation:  Currently on room air, wheezing persistent, but much improved.  Able to ambulated with nursing today without desatting and with improved SOB with exertion Discharged home with steroid taper until home 20 mg daily    Resume home medications at discharge S/p doxycycline Continue IS Pt has appt with Dr. Annamaria Boots coming up on Monday  Steroid-induced diabetes mellitus. A1c of 7.2 criteria for diabetes (was prediabetic previously).  Metformin discontinued in setting of frequent admissions with elevated lactate Discharged home with lantus 15 units nightly (receiving levemir 20 units daily here with 6 units mealtime TID), will need continued outpatient f/u for this to continue to adjust regimen   HTN Continue lasix, aldactone, metoprolol, losartan CTM  Leukocytosis Likely reactive secondary to chronic steroid use. Continue to monitor at discharge.  Atrial fibrillation on anticoagulation Status postcardioversionon 05/2017. Rate controlled on Cardizem. Continue Eliquis (previous provider wrote 30 day prescription since pt ran out of it.).  Follows with Dr.Croitoru.  Diarrhea and depression Continue BuSpar and Zoloft.  Chronic diastolic CHF Asymptomatic and euvolemic.[name] Continue Lasix, Aldactone and beta-blocker.  Morbid obesity with body mass index of 50.0-59.9 in adult ([place])  OSA on CPAP  Procedures:  none  Consultations:  none  Discharge Exam: Vitals:   07/12/17 0500 07/12/17  0515  BP: (!) 160/88   Pulse: 61   Resp: 18   Temp: 98.1 F (36.7 C)   SpO2: 100% 99%   Feeling a bit  better.  SOB is improved.  General: No acute distress. Cardiovascular: Heart sounds show a regular rate, and rhythm. No gallops or rubs. No murmurs. No JVD. Lungs: Still with some wheezing, but improved from yesterday, no increased WOB Abdomen: Soft, nontender, nondistended with normal active bowel sounds. No masses. No hepatosplenomegaly. Neurological: Alert and oriented 3. Moves all extremities 4. Cranial nerves II through XII grossly intact. Skin: Warm and dry. No rashes or lesions. Extremities: No clubbing or cyanosis. No LEE Psychiatric: Mood and affect are normal. Insight and judgment are appropriate.  Discharge Instructions   Discharge Instructions    Call MD for:  extreme fatigue   Complete by:  As directed    Call MD for:  persistant dizziness or light-headedness   Complete by:  As directed    Call MD for:  persistant nausea and vomiting   Complete by:  As directed    Call MD for:  redness, tenderness, or signs of infection (pain, swelling, redness, odor or green/yellow discharge around incision site)   Complete by:  As directed    Call MD for:  severe uncontrolled pain   Complete by:  As directed    Call MD for:  temperature >100.4   Complete by:  As directed    Diet - low sodium heart healthy   Complete by:  As directed    Discharge instructions   Complete by:  As directed    You were seen for an asthma exacerbation.  You were treated with nebulizers, steroids, and antibiotics.  Take your albuterol every 4-6 hours for the next 24-48 hours until you feel back to normal, then take as needed.   Resume your home asthma medicines.  Take the steroid taper until you get to your 20 mg daily.    Follow up with Dr. Annamaria Boots for your asthma and long term plans for your medications and steroids.  Return for new worsening or recurrent symptoms.  You're being sent home with insulin.  Take this daily at bedtime.  Check your blood sugar 4 times daily before meals and before  bedtime.  Keep a list of your blood sugars for your PCP so they can adjust this.   Please have your PCP request records from this hospitalization so they know what was done and the next steps.   Increase activity slowly   Complete by:  As directed      Allergies as of 07/12/2017   No Known Allergies     Medication List    TAKE these medications   albuterol 108 (90 Base) MCG/ACT inhaler Commonly known as:  PROVENTIL HFA;VENTOLIN HFA Inhale 2 puffs into the lungs every 4 (four) hours as needed for wheezing or shortness of breath.   apixaban 5 MG Tabs tablet Commonly known as:  ELIQUIS Take 1 tablet (5 mg total) by mouth 2 (two) times daily.   arformoterol 15 MCG/2ML Nebu Commonly known as:  BROVANA Take 2 mLs (15 mcg total) by nebulization 2 (two) times daily.   blood glucose meter kit and supplies Kit Dispense based on patient and insurance preference. Use up to four times daily as directed. (FOR ICD-9 250.00, 250.01).   budesonide 0.25 MG/2ML nebulizer solution Commonly known as:  PULMICORT Take 2 mLs (0.25 mg total) by nebulization 2 (two) times daily.  busPIRone 7.5 MG tablet Commonly known as:  BUSPAR Take 1 tablet (7.5 mg total) 2 (two) times daily by mouth.   butalbital-acetaminophen-caffeine 50-325-40 MG tablet Commonly known as:  FIORICET, ESGIC Take 1-2 tablets by mouth every 6 (six) hours as needed for headache.   diltiazem 360 MG 24 hr capsule Commonly known as:  CARDIZEM CD Take 1 capsule (360 mg total) by mouth daily.   famotidine 20 MG tablet Commonly known as:  PEPCID Take 1 tablet (20 mg total) by mouth 2 (two) times daily.   fluticasone 50 MCG/ACT nasal spray Commonly known as:  FLONASE Place 2 sprays into both nostrils daily.   furosemide 40 MG tablet Commonly known as:  LASIX Take 1 tablet (40 mg total) daily by mouth.   guaiFENesin 600 MG 12 hr tablet Commonly known as:  MUCINEX Take 1 tablet (600 mg total) by mouth 2 (two) times daily.    insulin glargine 100 unit/mL Sopn Commonly known as:  LANTUS Inject 0.15 mLs (15 Units total) into the skin at bedtime.   loratadine 10 MG tablet Commonly known as:  CLARITIN Take 1 tablet (10 mg total) by mouth daily.   losartan 100 MG tablet Commonly known as:  COZAAR Take 1 tablet (100 mg total) daily by mouth.   metoprolol tartrate 100 MG tablet Commonly known as:  LOPRESSOR Take 1 tablet (100 mg total) by mouth 2 (two) times daily.   mometasone-formoterol 200-5 MCG/ACT Aero Commonly known as:  DULERA Inhale 2 puffs into the lungs 2 (two) times daily.   montelukast 10 MG tablet Commonly known as:  SINGULAIR Take 1 tablet (10 mg total) at bedtime by mouth.   nicotine 14 mg/24hr patch Commonly known as:  NICODERM CQ - dosed in mg/24 hours Place 1 patch (14 mg total) onto the skin daily.   omeprazole 20 MG capsule Commonly known as:  PRILOSEC Take 1 capsule (20 mg total) by mouth 2 (two) times daily.   predniSONE 20 MG tablet Commonly known as:  DELTASONE Take 1 tablet (20 mg total) by mouth daily with breakfast. What changed:  Another medication with the same name was added. Make sure you understand how and when to take each.   predniSONE 20 MG tablet Commonly known as:  DELTASONE Take 2 tablets (40 mg total) by mouth daily with breakfast for 3 days, THEN 1.5 tablets (30 mg total) daily with breakfast for 3 days. Then resume home 20 mg daily. Start taking on:  07/13/2017 What changed:  You were already taking a medication with the same name, and this prescription was added. Make sure you understand how and when to take each.   sertraline 100 MG tablet Commonly known as:  ZOLOFT Take 1 tablet (100 mg total) daily by mouth.   spironolactone 100 MG tablet Commonly known as:  ALDACTONE Take 1 tablet (100 mg total) daily by mouth.   Vitamin D (Ergocalciferol) 50000 units Caps capsule Commonly known as:  DRISDOL Take 1 capsule (50,000 Units total) by mouth every 7  (seven) days.   zolpidem 10 MG tablet Commonly known as:  AMBIEN Take 10 mg by mouth at bedtime.      No Known Allergies Follow-up Information    [name], MD Follow up.   Specialty:  Family Medicine Contact information: [place] [address] [phone]        Sanda Klein, MD .   Specialty:  Cardiology Contact information: [address] [place] [address]  [phone]        Deneise Lever, MD Follow up.   Specialty:  Pulmonary Disease Contact information: [place] [address] [phone]            The results of significant diagnostics from this hospitalization (including imaging, microbiology, ancillary and laboratory) are listed below for reference.    Significant Diagnostic Studies: Dg Chest 2 View  Result Date: 07/07/2017 CLINICAL DATA:  47 year old female with shortness of breath and wheezing. EXAM: CHEST - 2 VIEW COMPARISON:  Chest radiograph dated 06/05/2017 FINDINGS: Minimal left lung base atelectatic changes. No focal consolidation, pleural effusion, or pneumothorax. Stable cardiac silhouette. No acute osseous pathology. IMPRESSION: No active cardiopulmonary disease. Electronically Signed   By: Anner Crete M.D.   On: 07/07/2017 03:48    Microbiology: Recent Results (from the past 240 hour(s))  MRSA PCR Screening     Status: None   Collection Time: 07/08/17  4:44 AM  Result Value Ref Range Status   MRSA by PCR NEGATIVE NEGATIVE Final    Comment:        The GeneXpert MRSA Assay (FDA approved for NASAL specimens only), is one component of a comprehensive MRSA colonization surveillance program. It is not intended to diagnose MRSA infection nor to guide or monitor treatment for MRSA infections. Performed at [facility], [place] [address] [place], [place] [address]      Labs: Basic Metabolic Panel: Recent Labs  Lab 07/07/17 0508 07/09/17 0817 07/12/17 0611   NA 139 134* 137  K 3.5 4.1 3.6  CL 99* 99* 101  CO2 '28 24 28  '$ GLUCOSE 132* 356* 157*  BUN '12 16 17  '$ CREATININE 0.90 0.88 0.96  CALCIUM 8.4* 7.8* 8.1*   Liver Function Tests: No results for input(s): AST, ALT, ALKPHOS, BILITOT, PROT, ALBUMIN in the last 168 hours. No results for input(s): LIPASE, AMYLASE in the last 168 hours. No results for input(s): AMMONIA in the last 168 hours. CBC: Recent Labs  Lab 07/07/17 0508 07/07/17 2326 07/09/17 0817 07/12/17 0611  WBC 23.2* 27.3* 24.7* 25.4*  NEUTROABS 18.4*  --   --   --   HGB 12.3 11.8* 12.1 12.1  HCT 40.0 38.1 38.6 38.3  MCV 90.3 89.0 88.3 87.2  PLT 402* 322 356 276   Cardiac Enzymes: No results for input(s): CKTOTAL, CKMB, CKMBINDEX, TROPONINI in the last 168 hours. BNP: BNP (last 3 results) Recent Labs    02/14/17 2105 04/27/17 1254 05/26/17 0757  BNP 15.7 21.7 33.9    ProBNP (last 3 results) No results for input(s): PROBNP in the last 8760 hours.  CBG: Recent Labs  Lab 07/11/17 1249 07/11/17 1628 07/11/17 2053 07/12/17 0753 07/12/17 1214  GLUCAP 203* 197* 203* 123* 178*       Signed:  Fayrene Helper MD.  [facility] 07/12/2017, 9:04 PM

## 2017-07-12 NOTE — Progress Notes (Signed)
Nsg Discharge Note  Admit Date:  07/07/2017 Discharge date: 07/12/2017   [name] to be D/C'd Home per MD order.  AVS completed.  Copy for chart, and copy for patient signed, and dated. Patient/caregiver able to verbalize understanding.  Discharge Medication: Allergies as of 07/12/2017   No Known Allergies     Medication List    TAKE these medications   albuterol 108 (90 Base) MCG/ACT inhaler Commonly known as:  PROVENTIL HFA;VENTOLIN HFA Inhale 2 puffs into the lungs every 4 (four) hours as needed for wheezing or shortness of breath.   apixaban 5 MG Tabs tablet Commonly known as:  ELIQUIS Take 1 tablet (5 mg total) by mouth 2 (two) times daily.   arformoterol 15 MCG/2ML Nebu Commonly known as:  BROVANA Take 2 mLs (15 mcg total) by nebulization 2 (two) times daily.   blood glucose meter kit and supplies Kit Dispense based on patient and insurance preference. Use up to four times daily as directed. (FOR ICD-9 250.00, 250.01).   budesonide 0.25 MG/2ML nebulizer solution Commonly known as:  PULMICORT Take 2 mLs (0.25 mg total) by nebulization 2 (two) times daily.   busPIRone 7.5 MG tablet Commonly known as:  BUSPAR Take 1 tablet (7.5 mg total) 2 (two) times daily by mouth.   butalbital-acetaminophen-caffeine 50-325-40 MG tablet Commonly known as:  FIORICET, ESGIC Take 1-2 tablets by mouth every 6 (six) hours as needed for headache.   diltiazem 360 MG 24 hr capsule Commonly known as:  CARDIZEM CD Take 1 capsule (360 mg total) by mouth daily.   famotidine 20 MG tablet Commonly known as:  PEPCID Take 1 tablet (20 mg total) by mouth 2 (two) times daily.   fluticasone 50 MCG/ACT nasal spray Commonly known as:  FLONASE Place 2 sprays into both nostrils daily.   furosemide 40 MG tablet Commonly known as:  LASIX Take 1 tablet (40 mg total) daily by mouth.   guaiFENesin 600 MG 12 hr tablet Commonly known as:  MUCINEX Take 1 tablet (600 mg total) by mouth 2 (two)  times daily.   insulin glargine 100 unit/mL Sopn Commonly known as:  LANTUS Inject 0.15 mLs (15 Units total) into the skin at bedtime.   loratadine 10 MG tablet Commonly known as:  CLARITIN Take 1 tablet (10 mg total) by mouth daily.   losartan 100 MG tablet Commonly known as:  COZAAR Take 1 tablet (100 mg total) daily by mouth.   metoprolol tartrate 100 MG tablet Commonly known as:  LOPRESSOR Take 1 tablet (100 mg total) by mouth 2 (two) times daily.   mometasone-formoterol 200-5 MCG/ACT Aero Commonly known as:  DULERA Inhale 2 puffs into the lungs 2 (two) times daily.   montelukast 10 MG tablet Commonly known as:  SINGULAIR Take 1 tablet (10 mg total) at bedtime by mouth.   nicotine 14 mg/24hr patch Commonly known as:  NICODERM CQ - dosed in mg/24 hours Place 1 patch (14 mg total) onto the skin daily.   omeprazole 20 MG capsule Commonly known as:  PRILOSEC Take 1 capsule (20 mg total) by mouth 2 (two) times daily.   predniSONE 20 MG tablet Commonly known as:  DELTASONE Take 1 tablet (20 mg total) by mouth daily with breakfast. What changed:  Another medication with the same name was added. Make sure you understand how and when to take each.   predniSONE 20 MG tablet Commonly known as:  DELTASONE Take 2 tablets (40 mg total) by mouth daily with  breakfast for 3 days, THEN 1.5 tablets (30 mg total) daily with breakfast for 3 days. Then resume home 20 mg daily. Start taking on:  07/13/2017 What changed:  You were already taking a medication with the same name, and this prescription was added. Make sure you understand how and when to take each.   sertraline 100 MG tablet Commonly known as:  ZOLOFT Take 1 tablet (100 mg total) daily by mouth.   spironolactone 100 MG tablet Commonly known as:  ALDACTONE Take 1 tablet (100 mg total) daily by mouth.   Vitamin D (Ergocalciferol) 50000 units Caps capsule Commonly known as:  DRISDOL Take 1 capsule (50,000 Units total) by  mouth every 7 (seven) days.   zolpidem 10 MG tablet Commonly known as:  AMBIEN Take 10 mg by mouth at bedtime.       Discharge Assessment: Vitals:   07/12/17 0500 07/12/17 0515  BP: (!) 160/88   Pulse: 61   Resp: 18   Temp: 98.1 F (36.7 C)   SpO2: 100% 99%   Skin clean, dry and intact without evidence of skin break down, no evidence of skin tears noted. IV catheter discontinued intact. Site without signs and symptoms of complications - no redness or edema noted at insertion site, patient denies c/o pain - only slight tenderness at site.  Dressing with slight pressure applied.  D/c Instructions-Education: Discharge instructions given to patient/family with verbalized understanding. D/c education completed with patient/family including follow up instructions, medication list, d/c activities limitations if indicated, with other d/c instructions as indicated by MD - patient able to verbalize understanding, all questions fully answered. Patient instructed to return to ED, call 911, or call MD for any changes in condition.  Patient escorted via [place], and D/C home via private auto.  Salley Slaughter, RN 07/12/2017 4:52 PM

## 2017-07-14 ENCOUNTER — Telehealth: Payer: Self-pay

## 2017-07-14 ENCOUNTER — Ambulatory Visit (INDEPENDENT_AMBULATORY_CARE_PROVIDER_SITE_OTHER): Payer: Medicaid Other | Admitting: Internal Medicine

## 2017-07-14 ENCOUNTER — Encounter: Payer: Self-pay | Admitting: Internal Medicine

## 2017-07-14 VITALS — BP 148/90 | HR 85 | Ht 66.0 in | Wt 340.8 lb

## 2017-07-14 DIAGNOSIS — Z720 Tobacco use: Secondary | ICD-10-CM

## 2017-07-14 DIAGNOSIS — J301 Allergic rhinitis due to pollen: Secondary | ICD-10-CM

## 2017-07-14 DIAGNOSIS — J4541 Moderate persistent asthma with (acute) exacerbation: Secondary | ICD-10-CM | POA: Diagnosis not present

## 2017-07-14 DIAGNOSIS — G4733 Obstructive sleep apnea (adult) (pediatric): Secondary | ICD-10-CM

## 2017-07-14 DIAGNOSIS — Z6841 Body Mass Index (BMI) 40.0 and over, adult: Secondary | ICD-10-CM | POA: Diagnosis not present

## 2017-07-14 DIAGNOSIS — Z9989 Dependence on other enabling machines and devices: Secondary | ICD-10-CM | POA: Diagnosis not present

## 2017-07-14 MED ORDER — BUDESONIDE-FORMOTEROL FUMARATE 160-4.5 MCG/ACT IN AERO
2.0000 | INHALATION_SPRAY | Freq: Two times a day (BID) | RESPIRATORY_TRACT | 0 refills | Status: DC
Start: 2017-07-14 — End: 2017-08-18

## 2017-07-14 MED ORDER — PREDNISONE 10 MG PO TABS: 10.0000 mg | ORAL_TABLET | Freq: Every day | ORAL | 5 refills | Status: DC

## 2017-07-14 MED ORDER — LEVALBUTEROL HCL 0.63 MG/3ML IN NEBU
0.6300 mg | INHALATION_SOLUTION | Freq: Once | RESPIRATORY_TRACT | Status: AC
  Administered 2017-07-14: 0.63 mg via RESPIRATORY_TRACT

## 2017-07-14 NOTE — Telephone Encounter (Signed)
[facility] Post-discharge Follow-Up Phone Call:  Date of Discharge: 07/12/2017 Principal Discharge Diagnosis(es): acute respirtatory failure 2/2 asthma  Post-discharge Communication: (Clearly document all attempts clearly and date contact made) call placed to # [phone] (M) and a HIPAA compliant voicemail message was left requesting a call back to # [phone].  Call Completed:no

## 2017-07-14 NOTE — Patient Instructions (Addendum)
Order- neb xopenex 0.63     Dx asthma exacerbation  Finish prednisone taper and go back to maintenance prednisone 10 mg daily  I do suggest you start daily Claritin/ loratadine antihistamine to help with the pollen allergy  We will check on the status of the Fasenra to replace Xolair shots.  Order- DME Advanced- Please change mask to nasal pillows or mask of choice, repolace supplies, continue auto 5-20, humidifier, AirView

## 2017-07-14 NOTE — Progress Notes (Signed)
Patient ID: April [name], female    DOB: January 31, 1971, 47 y.o.   MRN: [id number]   HPI F former smoker followed for chronic severe asthma/ Xolair complicated by non-compliance,  OSA/ CPAP,  morbid obesity, HBP, depression, GERD, AFib/ Eliquis  NPSG 03/31/07- AHI 5.8/ hr, weight 330 lbs. She canceled planned more recent study. Office spirometry 10/24/14- severe obstructive airways disease. FVC 2.10/66%, FEV1 1.25/47%, FEV1/FVC 0.59, FEF 25-75 percent 0.64/19% Unattended HST 07/06/16- AHI 11/hour, desaturation to 74%. Office Spirometry 07/29/2016-moderately severe obstructive airways disease. FVC 2.21/68%, FEV1 1.53/57%, ratio 0.69, FEF 25-75% 0.98/34% -------------------------------------------------------  01/06/17- 47 year old female former smoker followed for chronic severe asthma/Xolair, complicated by noncompliance, OSA/CPAP, morbid obesity, HBP, depression, GERD CPAP AutoPap 5-20/Advanced FOLLOWS FOR: Recent ED visits for SOB; patient does not care to take Prednisone. Also, patient has missed Xolair injections -would like to get restarted if possible.  Eosinophils 1% on steroids 01/01/17 Says she is "up and down". Has not been consistent with Xolair. Tries to get by with less prednisone but took 40 mg yesterday because of increasing wheeze. No longer has a maintenance controller, only rescue inhaler. 5 ER visits last 6 months. Needs nebulizer machine. 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 CXR 01/01/17 IMPRESSION: No active cardiopulmonary disease.  07/14/17- 47 year old female former smoker followed for chronic severe asthma/Xolair, complicated by noncompliance, OSA/CPAP, morbid obesity, HBP, depression, GERD CPAP AutoPap 5-20/Advanced Prednisone 10 mg daily,  Singulair, Dulera 200, nebulizer Pulmicort/ Brovana,, albuterol HFA Now starting prednisone taper after most recent hospital for exacerbation.  This episode began as gradual  worsening of chest tightness and wheeze over 2 days with no impression that she had an infection, fever or sore throat.  Eyes are watering and nose running-blames pollen.  Turned down for weight loss surgery because of frequent hospitalizations.  Applying for disability-unable to hold her job because of frequent illness.  Running out of inhalers and asks for samples.  Does have nebulizer machine which she used just before coming here. Patient has not filled out application forms for [facility]ue despite multiple calls from our office. We had hoped the longer injection interval compared with Xolair would be easier for her to comply with.  She says she is using her CPAP machine "most nights"-no download available.  She wants to change to nasal pillows mask and agrees to take out her nasal ring for this.  Review of Systems-See HPI  + = positive Constitutional:   No-   weight loss, night sweats, fevers, chills, +fatigue, lassitude. HEENT:   No-  headaches, difficulty swallowing, tooth/dental problems, sore throat,    sneezing, itching, ear ache, +nasal congestion, post nasal drip,  CV:  No-   chest pain, orthopnea, PND, swelling in lower extremities, anasarca, dizziness, palpitations Resp: +shortness of breath with exertion or at rest.              productive cough,  + non-productive cough,  No- coughing up of blood.              No-   change in color of mucus. +wheezing.   Skin: No-   rash or lesions. GI:  No-   heartburn, indigestion, abdominal pain, nausea, vomiting,  GU: MS:  No-   joint pain or swelling.   Neuro-     nothing unusual Psych:  No- change in mood or affect. No depression or anxiety.  No memory loss.    Objective:   Physical  Exam General- Alert, Oriented, Affect-appropriate, Distress- none acute, + morbid  obesity,  Skin- rash-none, lesions- none, excoriation- none Lymphadenopathy- none Head- atraumatic            Eyes- Gross vision intact, PERRLA, conjunctivae clear secretions             Ears- Hearing, canals-normal            Nose- Clear, no-Septal dev, mucus, polyps, erosion, perforation. + sniffing            Throat- Mallampati II , mucosa clear , drainage- none, tonsils- atrophic,                           + Missing  teeth, upper denture plate                     Neck- flexible , trachea midline, no stridor , thyroid nl, carotid no bruit Chest - symmetrical excursion , unlabored           Heart/CV- RRR , no murmur , no gallop  , no rub, nl s1 s2                           - JVD- none , edema- none, stasis changes- none, varices- none           Lung- +Diminished/ clear now, unlabored.  Wheeze -None, cough-none,                                               dullness-none,  rub- none           Chest wall-  Abd-  Br/ Gen/ Rectal- Not done, not indicated Extrem- cyanosis- none, clubbing, none, atrophy- none, strength- nl Neuro- grossly intact to observation

## 2017-07-14 NOTE — Telephone Encounter (Signed)
Call received from April [name], [place]. She reported that she had recently closed the patient's case on 07/11/17 as the patient is knowledgeable about community resources and has exhausted resources that could benefit her - ie- rent and utility assistance. April [name] explained that she has worked with the patient extensively to provide support and resource information.  The patient was re-hospitalized last week and was referred to [facility] again.  April [name] noted that she has tried to contact the patient today and her phone has been disconnected. She also noted that she has tried to contact the patient's mother without success. She will continue to attempt to reach out to the patient.

## 2017-07-15 ENCOUNTER — Telehealth: Payer: Self-pay

## 2017-07-15 NOTE — Assessment & Plan Note (Signed)
Noncompliance is a significant issue aggravated by inability to maintain employment/insurance/income.  We discussed and will check on status of April [name] but she has to do her part on the application. She is returning to maintenance prednisone 10 mg daily and is encouraged to use her nebulizer machine consistently up to 4 times daily.

## 2017-07-15 NOTE — Telephone Encounter (Signed)
Opened in error

## 2017-07-15 NOTE — Assessment & Plan Note (Signed)
She insists that she is no longer smoking

## 2017-07-15 NOTE — Assessment & Plan Note (Signed)
Not sure about compliance-discussed with her.

## 2017-07-15 NOTE — Assessment & Plan Note (Signed)
She was turned down for bariatric surgery because of her frequent hospitalizations.  We discussed goals, motivation effort, diet effort.

## 2017-07-15 NOTE — Telephone Encounter (Signed)
[facility] Post-discharge Follow-Up Phone Call:  Date of Discharge: 07/12/2017 Principal Discharge Diagnosis(es): acute respiratory failure with hypoxia, asthma exacerbation  Post-discharge Communication: (Clearly document all attempts clearly and date contact made) call received from the patient  Call Completed: Yes                    With Whom: patient Interpreter Needed: No     Please check all that apply:  X  Patient is knowledgeable of his/her condition(s) and/or treatment. - she stated that she is " starting to feel a little  Better" and knows that she needs to loose weight.  X  Patient is caring for self at home. - has some support from family ? Patient is receiving assist at home from family and/or caregiver. Family and/or caregiver is knowledgeable of patient's condition(s) and/or treatment. ? Patient is receiving home health services. If so, name of agency.     Medication Reconciliation:  ? Medication list reviewed with patient. X  Patient obtained all discharge medications. If not, why? - she stated that she has all medications, including inhalers and nebulizer solution. She stated that the only thing that she does not have is the glucometer.  She noted that she needs $10.00  to pick it up at her pharmacy. This CM explained to her that the prescription for the meter could be transferred to [facility] and she could put the charge on an account if she does not have $10.00. She stated that she understood.  She said that she has the insulin and took it the past 2 nights but is not sure that she took 15 units. She said that she was shown how to administer the insulin when she was in the hospital but she will need more instructions about the insulin and the glucometer as this is new to her. This CM offered for her to come to the clinic before her appointment on 07/18/17 for teaching but she said she would wait until her appointment .  She stated that she is taking all of  her medications as ordered.  She noted that she is finishing the prednisone taper and that is to take 10 mg daily as instructed by Dr Annamaria Boots.  She said that she did not need to review her medication list and doesn't have any questions about it.    Activities of Daily Living:      Independent X  Needs assist (describe; ? home DME used) - CPAP from [facility] ? [facility] (describe, ? home DME used)   Community resources in place for patient:  X  She has received services from [facility] in the past. Currently she receives PCS for 80 hours/month   ? Home Health/Home DME ? Assisted Living ? Support Group         Questions/Concerns discussed: she confirmed her appointment at [facility] for 07/18/17 @ 1400.  She also confirmed that she has transportation to the clinic.   She said that she is still waiting on a determination about her disability application and social security told her that they are waiting on paperwork from [facility] and the hospital.  This CM informed her that as per Rolm Gala, [facility], there is no current request for disability documentation.  The patient then said that she would inform social security.

## 2017-07-15 NOTE — Assessment & Plan Note (Signed)
Suspect early seasonal pollen rhinitis Plan-recommended daily Claritin, Flonase

## 2017-07-18 ENCOUNTER — Encounter: Payer: Self-pay | Admitting: Family Medicine

## 2017-07-18 ENCOUNTER — Ambulatory Visit: Payer: Medicaid Other | Attending: Family Medicine | Admitting: Family Medicine

## 2017-07-18 VITALS — BP 126/92 | HR 112 | Temp 98.5°F | Ht 66.0 in | Wt 336.0 lb

## 2017-07-18 DIAGNOSIS — Z7901 Long term (current) use of anticoagulants: Secondary | ICD-10-CM | POA: Insufficient documentation

## 2017-07-18 DIAGNOSIS — I10 Essential (primary) hypertension: Secondary | ICD-10-CM | POA: Diagnosis not present

## 2017-07-18 DIAGNOSIS — J9600 Acute respiratory failure, unspecified whether with hypoxia or hypercapnia: Secondary | ICD-10-CM | POA: Diagnosis not present

## 2017-07-18 DIAGNOSIS — G4700 Insomnia, unspecified: Secondary | ICD-10-CM | POA: Diagnosis not present

## 2017-07-18 DIAGNOSIS — I4891 Unspecified atrial fibrillation: Secondary | ICD-10-CM

## 2017-07-18 DIAGNOSIS — E099 Drug or chemical induced diabetes mellitus without complications: Secondary | ICD-10-CM | POA: Insufficient documentation

## 2017-07-18 DIAGNOSIS — K219 Gastro-esophageal reflux disease without esophagitis: Secondary | ICD-10-CM | POA: Insufficient documentation

## 2017-07-18 DIAGNOSIS — J45901 Unspecified asthma with (acute) exacerbation: Secondary | ICD-10-CM | POA: Diagnosis present

## 2017-07-18 DIAGNOSIS — Z794 Long term (current) use of insulin: Secondary | ICD-10-CM | POA: Diagnosis not present

## 2017-07-18 DIAGNOSIS — G4733 Obstructive sleep apnea (adult) (pediatric): Secondary | ICD-10-CM | POA: Insufficient documentation

## 2017-07-18 DIAGNOSIS — F32A Depression, unspecified: Secondary | ICD-10-CM

## 2017-07-18 DIAGNOSIS — T380X5A Adverse effect of glucocorticoids and synthetic analogues, initial encounter: Secondary | ICD-10-CM | POA: Insufficient documentation

## 2017-07-18 DIAGNOSIS — F329 Major depressive disorder, single episode, unspecified: Secondary | ICD-10-CM | POA: Diagnosis present

## 2017-07-18 DIAGNOSIS — J4489 Other specified chronic obstructive pulmonary disease: Secondary | ICD-10-CM

## 2017-07-18 DIAGNOSIS — J4541 Moderate persistent asthma with (acute) exacerbation: Secondary | ICD-10-CM | POA: Diagnosis not present

## 2017-07-18 DIAGNOSIS — E119 Type 2 diabetes mellitus without complications: Secondary | ICD-10-CM | POA: Diagnosis not present

## 2017-07-18 DIAGNOSIS — Z7951 Long term (current) use of inhaled steroids: Secondary | ICD-10-CM | POA: Diagnosis not present

## 2017-07-18 DIAGNOSIS — F419 Anxiety disorder, unspecified: Secondary | ICD-10-CM | POA: Insufficient documentation

## 2017-07-18 DIAGNOSIS — J449 Chronic obstructive pulmonary disease, unspecified: Secondary | ICD-10-CM | POA: Insufficient documentation

## 2017-07-18 LAB — GLUCOSE, POCT (MANUAL RESULT ENTRY): POC Glucose: 148 mg/dl — AB (ref 70–99)

## 2017-07-18 MED ORDER — LOSARTAN POTASSIUM 100 MG PO TABS: 100.0000 mg | ORAL_TABLET | Freq: Every day | ORAL | 3 refills | Status: DC

## 2017-07-18 MED ORDER — GLIPIZIDE 5 MG PO TABS: 2.5000 mg | ORAL_TABLET | Freq: Two times a day (BID) | ORAL | 3 refills | Status: DC

## 2017-07-18 MED ORDER — SPIRONOLACTONE 100 MG PO TABS: 100.0000 mg | ORAL_TABLET | Freq: Every day | ORAL | 3 refills | Status: DC

## 2017-07-18 MED ORDER — GLUCOSE BLOOD VI STRP: ORAL_STRIP | 12 refills | Status: DC

## 2017-07-18 MED ORDER — APIXABAN 5 MG PO TABS: 5.0000 mg | ORAL_TABLET | Freq: Two times a day (BID) | ORAL | 3 refills | Status: DC

## 2017-07-18 MED ORDER — ACCU-CHEK AVIVA PLUS W/DEVICE KIT: PACK | 0 refills | Status: DC

## 2017-07-18 MED ORDER — ACCU-CHEK SOFTCLIX LANCETS MISC: 12 refills | Status: DC

## 2017-07-18 MED ORDER — METOPROLOL TARTRATE 100 MG PO TABS: 100.0000 mg | ORAL_TABLET | Freq: Two times a day (BID) | ORAL | 3 refills | Status: DC

## 2017-07-18 MED ORDER — SERTRALINE HCL 100 MG PO TABS: 100.0000 mg | ORAL_TABLET | Freq: Every day | ORAL | 3 refills | Status: DC

## 2017-07-18 MED ORDER — ACCU-CHEK AVIVA DEVI: 0 refills | Status: DC

## 2017-07-18 MED ORDER — MONTELUKAST SODIUM 10 MG PO TABS: 10.0000 mg | ORAL_TABLET | Freq: Every day | ORAL | 3 refills | Status: DC

## 2017-07-18 MED ORDER — ALBUTEROL SULFATE HFA 108 (90 BASE) MCG/ACT IN AERS: 2.0000 | INHALATION_SPRAY | RESPIRATORY_TRACT | 3 refills | Status: DC | PRN

## 2017-07-18 NOTE — Progress Notes (Signed)
Subjective:  Patient ID: April [name], female    DOB: Feb 06, 1971  Age: 47 y.o. MRN: [id number]  CC: Hospitalization Follow-up and Asthma   HPI April [name]  is a 47 year old female with a history of morbid obesity, major depressive disorder, hypertension, Asthma (On Xolair injections every 2 weeks), obstructive sleep apnea (on CPAP) with multiple hospitalizations for asthma exacerbation here for a follow-up visit.  Her last hospitalization was at [facility] from 07/07/17 -07/12/17 where she was hospitalized for acute respiratory failure secondary to asthma exacerbation and newly Diagnosed steroid induced Diabetes Mellitus, A1C of 7.2. Asthma was treated with oxygen initially,nebulizer treatments and steroids as well as an antibiotic with improvement in symptoms. She was commenced on Lantus 15 units at bedtime and Novolog 6 units tid for Diabetes. She was discharged on a Prednisone taper.  Since discharge she has been seen by her Pulmonologist -Dr Annamaria Boots and was advised to complete Prednisone taper and then continue Maintenance dose of '10mg'$ . Denies wheezing and shortness of breath is at baseline. With regards to her Diabetes Mellitus she just picked up her Lantus and is yet to commence it and also does not check her sugars.  Past Medical History:  Diagnosis Date  . Acanthosis nigricans   . Anxiety   . Arthritis    "knees" (04/28/2017)  . Asthma    Followed by Dr. Annamaria Boots (pulmonology); receives every other week omalizumab injections; has frequent exacerbations  . COPD (chronic obstructive pulmonary disease) ([place])    PFTs in 2002, FEV1/FVC 65, no post bronchodilater test done  . Depression   . GERD (gastroesophageal reflux disease)   . Headache(784.0)    "q couple days" (04/28/2017)  . Helicobacter pylori (H. pylori) infection   . Hypertension, essential   . Insomnia   . Menorrhagia   . Morbid obesity ([place])   . OSA on CPAP    Sleep study 2008 - mild OSA, not enough events to  titrate CPAP; wears CPAP now/pt on 04/28/2017  . Pneumonia X 1  . Seasonal allergies   . Shortness of breath   . Tobacco user     Past Surgical History:  Procedure Laterality Date  . CARDIOVERSION N/A 05/30/2017   Procedure: CARDIOVERSION;  Surgeon: Sanda Klein, MD;  Location: [facility];  Service: Cardiovascular;  Laterality: N/A;  . REDUCTION MAMMAPLASTY Bilateral 09/2011  . TUBAL LIGATION  1996   bilateral    No Known Allergies   Outpatient Medications Prior to Visit  Medication Sig Dispense Refill  . arformoterol (BROVANA) 15 MCG/2ML NEBU Take 2 mLs (15 mcg total) by nebulization 2 (two) times daily. 120 mL 1  . blood glucose meter kit and supplies KIT Dispense based on patient and insurance preference. Use up to four times daily as directed. (FOR ICD-9 250.00, 250.01). 1 each 0  . budesonide (PULMICORT) 0.25 MG/2ML nebulizer solution Take 2 mLs (0.25 mg total) by nebulization 2 (two) times daily. 120 mL 2  . budesonide-formoterol (SYMBICORT) 160-4.5 MCG/ACT inhaler Inhale 2 puffs into the lungs 2 (two) times daily. 1 Inhaler 0  . busPIRone (BUSPAR) 7.5 MG tablet Take 1 tablet (7.5 mg total) 2 (two) times daily by mouth. 60 tablet 3  . butalbital-acetaminophen-caffeine (FIORICET, ESGIC) 50-325-40 MG tablet Take 1-2 tablets by mouth every 6 (six) hours as needed for headache. 20 tablet 0  . diltiazem (CARDIZEM CD) 360 MG 24 hr capsule Take 1 capsule (360 mg total) by mouth daily. 30 capsule 3  . famotidine (PEPCID)  20 MG tablet Take 1 tablet (20 mg total) by mouth 2 (two) times daily. 30 tablet 0  . fluticasone (FLONASE) 50 MCG/ACT nasal spray Place 2 sprays into both nostrils daily. 16 g 6  . furosemide (LASIX) 40 MG tablet Take 1 tablet (40 mg total) daily by mouth. 30 tablet 3  . guaiFENesin (MUCINEX) 600 MG 12 hr tablet Take 1 tablet (600 mg total) by mouth 2 (two) times daily. 30 tablet 0  . loratadine (CLARITIN) 10 MG tablet Take 1 tablet (10 mg total) by mouth daily. 90  tablet 3  . mometasone-formoterol (DULERA) 200-5 MCG/ACT AERO Inhale 2 puffs into the lungs 2 (two) times daily. 13 g 2  . omeprazole (PRILOSEC) 20 MG capsule Take 1 capsule (20 mg total) by mouth 2 (two) times daily.    . predniSONE (DELTASONE) 10 MG tablet Take 1 tablet (10 mg total) by mouth daily with breakfast. 30 tablet 5  . predniSONE (DELTASONE) 20 MG tablet Take 2 tablets (40 mg total) by mouth daily with breakfast for 3 days, THEN 1.5 tablets (30 mg total) daily with breakfast for 3 days. Then resume home 20 mg daily. 10.5 tablet 0  . Vitamin D, Ergocalciferol, (DRISDOL) 50000 units CAPS capsule Take 1 capsule (50,000 Units total) by mouth every 7 (seven) days. 6 capsule 0  . zolpidem (AMBIEN) 10 MG tablet Take 10 mg by mouth at bedtime.    [name] albuterol (PROVENTIL HFA;VENTOLIN HFA) 108 (90 Base) MCG/ACT inhaler Inhale 2 puffs into the lungs every 4 (four) hours as needed for wheezing or shortness of breath. 1 Inhaler 0  . apixaban (ELIQUIS) 5 MG TABS tablet Take 1 tablet (5 mg total) by mouth 2 (two) times daily. 60 tablet 0  . insulin glargine (LANTUS) 100 unit/mL SOPN Inject 0.15 mLs (15 Units total) into the skin at bedtime. 15 mL 0  . losartan (COZAAR) 100 MG tablet Take 1 tablet (100 mg total) daily by mouth. 30 tablet 3  . metoprolol tartrate (LOPRESSOR) 100 MG tablet Take 1 tablet (100 mg total) by mouth 2 (two) times daily. 60 tablet 3  . montelukast (SINGULAIR) 10 MG tablet Take 1 tablet (10 mg total) at bedtime by mouth. 30 tablet 3  . sertraline (ZOLOFT) 100 MG tablet Take 1 tablet (100 mg total) daily by mouth. 60 tablet 3  . spironolactone (ALDACTONE) 100 MG tablet Take 1 tablet (100 mg total) daily by mouth. 30 tablet 3   No facility-administered medications prior to visit.     ROS Review of Systems  Constitutional: Negative for activity change, appetite change and fatigue.  HENT: Negative for congestion, sinus pressure and sore throat.   Eyes: Negative for visual  disturbance.  Respiratory: Positive for shortness of breath (on mild exertion). Negative for cough, chest tightness and wheezing.   Cardiovascular: Negative for chest pain and palpitations.  Gastrointestinal: Negative for abdominal distention, abdominal pain and constipation.  Endocrine: Negative for polydipsia.  Genitourinary: Negative for dysuria and frequency.  Musculoskeletal: Negative for arthralgias and back pain.  Skin: Negative for rash.  Neurological: Negative for tremors, light-headedness and numbness.  Hematological: Does not bruise/bleed easily.  Psychiatric/Behavioral: Negative for agitation and behavioral problems.    Objective:  BP (!) 126/92   Pulse (!) 112   Temp 98.5 F (36.9 C) (Oral)   Ht '5\' 6"'$  (1.676 m)   Wt (!) 336 lb (152.4 kg)   LMP 07/07/2017   SpO2 99%   BMI 54.23 kg/m   BP/Weight  07/18/2017 07/14/2017 7/40/8144  Systolic BP 818 563 149  Diastolic BP 92 90 88  Wt. (Lbs) 336 340.8 -  BMI 54.23 55.01 -  Some encounter information is confidential and restricted. Go to Review Flowsheets activity to see all data.      Physical Exam  Constitutional: She is oriented to person, place, and time. She appears well-developed and well-nourished.  Obese  Cardiovascular: Normal rate, normal heart sounds and intact distal pulses.  No murmur heard. Pulmonary/Chest: Effort normal and breath sounds normal. She has no wheezes. She has no rales. She exhibits no tenderness.  Abdominal: Soft. Bowel sounds are normal. She exhibits no distension and no mass. There is no tenderness.  Musculoskeletal: Normal range of motion.  Neurological: She is alert and oriented to person, place, and time.  Skin: Skin is warm and dry.  Psychiatric: She has a normal mood and affect.     Lab Results  Component Value Date   HGBA1C 7.2 (H) 07/07/2017    Assessment & Plan:   1. Type 2 diabetes mellitus without complication, without long-term current use of insulin ([place]) Newly  diagnosed with A1c of 7.2 Placed on Glipizide and discussed management of hypoglycemia. Discontinued Lantus as she is currently on a Prednisone taper with a high likelihood of hypoglycemia once Prednisone dose tapers down. Pharmacist provided education on Glucometer use. - POCT glucose (manual entry) - glipiZIDE (GLUCOTROL) 5 MG tablet; Take 0.5 tablets (2.5 mg total) by mouth 2 (two) times daily before a meal.  Dispense: 60 tablet; Refill: 3 - Blood Glucose Monitoring Suppl (ACCU-CHEK AVIVA PLUS) w/Device KIT; Test blood sugar 4 times a day.  Dispense: 1 kit; Refill: 0  2. New onset atrial fibrillation (HCC) Stable Metoprolol and Cardizem appear on her list however she is unsure she is taking Cardizem and will call the clinic back with a list of her medications - apixaban (ELIQUIS) 5 MG TABS tablet; Take 1 tablet (5 mg total) by mouth 2 (two) times daily.  Dispense: 60 tablet; Refill: 3  3. Moderate persistent asthma with exacerbation Frequent exacerbations Currently on a Prednisone taper Continue Xolair and follow up with Pulmonary  4. Essential hypertension Slight diastolic elevation Low sodium diet - losartan (COZAAR) 100 MG tablet; Take 1 tablet (100 mg total) by mouth daily.  Dispense: 30 tablet; Refill: 3 - spironolactone (ALDACTONE) 100 MG tablet; Take 1 tablet (100 mg total) by mouth daily.  Dispense: 30 tablet; Refill: 3  5. Anxiety and depression Controlled - sertraline (ZOLOFT) 100 MG tablet; Take 1 tablet (100 mg total) by mouth daily.  Dispense: 60 tablet; Refill: 3  6. Asthma, chronic obstructive, without status asthmaticus (HCC) - albuterol (PROVENTIL HFA;VENTOLIN HFA) 108 (90 Base) MCG/ACT inhaler; Inhale 2 puffs into the lungs every 4 (four) hours as needed for wheezing or shortness of breath.  Dispense: 1 Inhaler; Refill: 3 - montelukast (SINGULAIR) 10 MG tablet; Take 1 tablet (10 mg total) by mouth at bedtime.  Dispense: 30 tablet; Refill: 3  7. Hypertension,  essential - metoprolol tartrate (LOPRESSOR) 100 MG tablet; Take 1 tablet (100 mg total) by mouth 2 (two) times daily.  Dispense: 60 tablet; Refill: 3   Meds ordered this encounter  Medications  . DISCONTD: Blood Glucose Monitoring Suppl (ACCU-CHEK AVIVA) device    Sig: Use as instructed daily.    Dispense:  1 each    Refill:  0  . glipiZIDE (GLUCOTROL) 5 MG tablet    Sig: Take 0.5 tablets (2.5 mg total) by  mouth 2 (two) times daily before a meal.    Dispense:  60 tablet    Refill:  3    Discontinue Lantus  . Blood Glucose Monitoring Suppl (ACCU-CHEK AVIVA PLUS) w/Device KIT    Sig: Test blood sugar 4 times a day.    Dispense:  1 kit    Refill:  0  . albuterol (PROVENTIL HFA;VENTOLIN HFA) 108 (90 Base) MCG/ACT inhaler    Sig: Inhale 2 puffs into the lungs every 4 (four) hours as needed for wheezing or shortness of breath.    Dispense:  1 Inhaler    Refill:  3  . apixaban (ELIQUIS) 5 MG TABS tablet    Sig: Take 1 tablet (5 mg total) by mouth 2 (two) times daily.    Dispense:  60 tablet    Refill:  3  . losartan (COZAAR) 100 MG tablet    Sig: Take 1 tablet (100 mg total) by mouth daily.    Dispense:  30 tablet    Refill:  3  . metoprolol tartrate (LOPRESSOR) 100 MG tablet    Sig: Take 1 tablet (100 mg total) by mouth 2 (two) times daily.    Dispense:  60 tablet    Refill:  3    Discontinue previous dose  . montelukast (SINGULAIR) 10 MG tablet    Sig: Take 1 tablet (10 mg total) by mouth at bedtime.    Dispense:  30 tablet    Refill:  3  . sertraline (ZOLOFT) 100 MG tablet    Sig: Take 1 tablet (100 mg total) by mouth daily.    Dispense:  60 tablet    Refill:  3    Discontinue previous dose  . spironolactone (ALDACTONE) 100 MG tablet    Sig: Take 1 tablet (100 mg total) by mouth daily.    Dispense:  30 tablet    Refill:  3    Follow-up: Return in about 2 weeks (around 08/01/2017) for Follow-up of diabetes mellitus.   [name] MD

## 2017-07-18 NOTE — Patient Instructions (Signed)
Diabetes Mellitus and Nutrition When you have diabetes (diabetes mellitus), it is very important to have healthy eating habits because your blood sugar (glucose) levels are greatly affected by what you eat and drink. Eating healthy foods in the appropriate amounts, at about the same times every day, can help you:  Control your blood glucose.  Lower your risk of heart disease.  Improve your blood pressure.  Reach or maintain a healthy weight.  Every person with diabetes is different, and each person has different needs for a meal plan. Your health care provider may recommend that you work with a diet and nutrition specialist (dietitian) to make a meal plan that is best for you. Your meal plan may vary depending on factors such as:  The calories you need.  The medicines you take.  Your weight.  Your blood glucose, blood pressure, and cholesterol levels.  Your activity level.  Other health conditions you have, such as heart or kidney disease.  How do carbohydrates affect me? Carbohydrates affect your blood glucose level more than any other type of food. Eating carbohydrates naturally increases the amount of glucose in your blood. Carbohydrate counting is a method for keeping track of how many carbohydrates you eat. Counting carbohydrates is important to keep your blood glucose at a healthy level, especially if you use insulin or take certain oral diabetes medicines. It is important to know how many carbohydrates you can safely have in each meal. This is different for every person. Your dietitian can help you calculate how many carbohydrates you should have at each meal and for snack. Foods that contain carbohydrates include:  Bread, cereal, rice, pasta, and crackers.  Potatoes and corn.  Peas, beans, and lentils.  Milk and yogurt.  Fruit and juice.  Desserts, such as cakes, cookies, ice cream, and candy.  How does alcohol affect me? Alcohol can cause a sudden decrease in blood  glucose (hypoglycemia), especially if you use insulin or take certain oral diabetes medicines. Hypoglycemia can be a life-threatening condition. Symptoms of hypoglycemia (sleepiness, dizziness, and confusion) are similar to symptoms of having too much alcohol. If your health care provider says that alcohol is safe for you, follow these guidelines:  Limit alcohol intake to no more than 1 drink per day for nonpregnant women and 2 drinks per day for men. One drink equals 12 oz of beer, 5 oz of wine, or 1 oz of hard liquor.  Do not drink on an empty stomach.  Keep yourself hydrated with water, diet soda, or unsweetened iced tea.  Keep in mind that regular soda, juice, and other mixers may contain a lot of sugar and must be counted as carbohydrates.  What are tips for following this plan? Reading food labels  Start by checking the serving size on the label. The amount of calories, carbohydrates, fats, and other nutrients listed on the label are based on one serving of the food. Many foods contain more than one serving per package.  Check the total grams (g) of carbohydrates in one serving. You can calculate the number of servings of carbohydrates in one serving by dividing the total carbohydrates by 15. For example, if a food has 30 g of total carbohydrates, it would be equal to 2 servings of carbohydrates.  Check the number of grams (g) of saturated and trans fats in one serving. Choose foods that have low or no amount of these fats.  Check the number of milligrams (mg) of sodium in one serving. Most people   should limit total sodium intake to less than 2,300 mg per day.  Always check the nutrition information of foods labeled as "low-fat" or "nonfat". These foods may be higher in added sugar or refined carbohydrates and should be avoided.  Talk to your dietitian to identify your daily goals for nutrients listed on the label. Shopping  Avoid buying canned, premade, or processed foods. These  foods tend to be high in fat, sodium, and added sugar.  Shop around the outside edge of the grocery store. This includes fresh fruits and vegetables, bulk grains, fresh meats, and fresh dairy. Cooking  Use low-heat cooking methods, such as baking, instead of high-heat cooking methods like deep frying.  Cook using healthy oils, such as olive, canola, or sunflower oil.  Avoid cooking with butter, cream, or high-fat meats. Meal planning  Eat meals and snacks regularly, preferably at the same times every day. Avoid going long periods of time without eating.  Eat foods high in fiber, such as fresh fruits, vegetables, beans, and whole grains. Talk to your dietitian about how many servings of carbohydrates you can eat at each meal.  Eat 4-6 ounces of lean protein each day, such as lean meat, chicken, fish, eggs, or tofu. 1 ounce is equal to 1 ounce of meat, chicken, or fish, 1 egg, or 1/4 cup of tofu.  Eat some foods each day that contain healthy fats, such as avocado, nuts, seeds, and fish. Lifestyle   Check your blood glucose regularly.  Exercise at least 30 minutes 5 or more days each week, or as told by your health care provider.  Take medicines as told by your health care provider.  Do not use any products that contain nicotine or tobacco, such as cigarettes and e-cigarettes. If you need help quitting, ask your health care provider.  Work with a counselor or diabetes educator to identify strategies to manage stress and any emotional and social challenges. What are some questions to ask my health care provider?  Do I need to meet with a diabetes educator?  Do I need to meet with a dietitian?  What number can I call if I have questions?  When are the best times to check my blood glucose? Where to find more information:  American Diabetes Association: diabetes.org/food-and-fitness/food  Academy of Nutrition and Dietetics:  www.eatright.org/resources/health/diseases-and-conditions/diabetes  National Institute of Diabetes and Digestive and Kidney Diseases (NIH): www.niddk.nih.gov/health-information/diabetes/overview/diet-eating-physical-activity Summary  A healthy meal plan will help you control your blood glucose and maintain a healthy lifestyle.  Working with a diet and nutrition specialist (dietitian) can help you make a meal plan that is best for you.  Keep in mind that carbohydrates and alcohol have immediate effects on your blood glucose levels. It is important to count carbohydrates and to use alcohol carefully. This information is not intended to replace advice given to you by your health care provider. Make sure you discuss any questions you have with your health care provider. Document Released: 01/10/2005 Document Revised: 05/20/2016 Document Reviewed: 05/20/2016 Elsevier Interactive Patient Education  2018 Elsevier Inc.  

## 2017-07-21 ENCOUNTER — Telehealth: Payer: Self-pay

## 2017-07-21 ENCOUNTER — Encounter: Payer: Self-pay | Admitting: Family Medicine

## 2017-07-21 NOTE — Telephone Encounter (Signed)
As per Veneda Melter, [place], the patient left the clinic on 07/18/17 prior to receiving instructions regarding glucometer use.  Attempted to contact the patient to inquire if she is able to use her glucometer and if she would like to schedule a teaching session with the pharmacist.  Call placed to # [phone] and a HIPAA compliant voicemail message was left requesting a call back to # [phone].

## 2017-07-22 ENCOUNTER — Telehealth: Payer: Self-pay

## 2017-07-22 NOTE — Telephone Encounter (Signed)
Attempted to contact the patient to inquire if she has been using her glucometer and if she needs any additional instruction regarding its use. 'call placed to # [phone] (M) and a HIPAA compliant voicemail message was left requesting a call back to # [phone]

## 2017-07-24 ENCOUNTER — Telehealth: Payer: Self-pay | Admitting: Family Medicine

## 2017-07-24 NOTE — Telephone Encounter (Signed)
Patient called to speak with Opal Sidles, patient was informed Opal Sidles was out of the office.

## 2017-07-24 NOTE — Telephone Encounter (Signed)
Call placed to patient [phone], to check on her status and ask if she is using glucometer and if she needs help with it or has questions about it. No answer. Left patient a message asking her to return my call at [phone].

## 2017-07-25 ENCOUNTER — Telehealth: Payer: Self-pay | Admitting: Family Medicine

## 2017-07-25 NOTE — Telephone Encounter (Signed)
Could be neuropathic symptoms.  She can schedule an office visit to see me to discuss this if symptoms persist.

## 2017-07-25 NOTE — Telephone Encounter (Signed)
Patient returned [place], case Freight forwarder, call. Spoke with patient and informed her that Opal Sidles was trying to reach her to schedule an appointment with Lurena Joiner, clinical pharmacist for glucometer teaching. Scheduled an appointment for Tuesday 07/29/17 at 2:30pm.   During conversation patient expressed that she was experiencing numbness on left foot and wanted to know if she could see provider on the same day as glucometer teaching. Informed patient that there were no appointments available at the moment but if there were any cancellations we would contact her. Also expressed to patient that I would let provider know of this as well.

## 2017-07-25 NOTE — Telephone Encounter (Signed)
Call returned to the patient # [phone] (M) and a HIPAA compliant voicemail message was left requesting a call back to # [phone] and ask for Opal Sidles or Venetia Night.

## 2017-07-28 ENCOUNTER — Telehealth: Payer: Self-pay

## 2017-07-28 ENCOUNTER — Other Ambulatory Visit: Payer: Self-pay

## 2017-07-28 ENCOUNTER — Encounter (HOSPITAL_COMMUNITY): Payer: Self-pay | Admitting: Emergency Medicine

## 2017-07-28 ENCOUNTER — Inpatient Hospital Stay: Payer: Medicaid Other | Admitting: Family Medicine

## 2017-07-28 ENCOUNTER — Emergency Department (HOSPITAL_COMMUNITY): Payer: Medicaid Other

## 2017-07-28 DIAGNOSIS — Z79899 Other long term (current) drug therapy: Secondary | ICD-10-CM

## 2017-07-28 DIAGNOSIS — Z7952 Long term (current) use of systemic steroids: Secondary | ICD-10-CM

## 2017-07-28 DIAGNOSIS — J302 Other seasonal allergic rhinitis: Secondary | ICD-10-CM | POA: Diagnosis present

## 2017-07-28 DIAGNOSIS — F17210 Nicotine dependence, cigarettes, uncomplicated: Secondary | ICD-10-CM | POA: Diagnosis present

## 2017-07-28 DIAGNOSIS — Z7984 Long term (current) use of oral hypoglycemic drugs: Secondary | ICD-10-CM

## 2017-07-28 DIAGNOSIS — J181 Lobar pneumonia, unspecified organism: Secondary | ICD-10-CM | POA: Diagnosis present

## 2017-07-28 DIAGNOSIS — M25562 Pain in left knee: Secondary | ICD-10-CM | POA: Diagnosis present

## 2017-07-28 DIAGNOSIS — G4700 Insomnia, unspecified: Secondary | ICD-10-CM | POA: Diagnosis present

## 2017-07-28 DIAGNOSIS — E871 Hypo-osmolality and hyponatremia: Secondary | ICD-10-CM | POA: Diagnosis present

## 2017-07-28 DIAGNOSIS — I160 Hypertensive urgency: Secondary | ICD-10-CM | POA: Diagnosis present

## 2017-07-28 DIAGNOSIS — I480 Paroxysmal atrial fibrillation: Secondary | ICD-10-CM | POA: Diagnosis present

## 2017-07-28 DIAGNOSIS — Z5329 Procedure and treatment not carried out because of patient's decision for other reasons: Secondary | ICD-10-CM | POA: Diagnosis not present

## 2017-07-28 DIAGNOSIS — I5033 Acute on chronic diastolic (congestive) heart failure: Secondary | ICD-10-CM | POA: Diagnosis present

## 2017-07-28 DIAGNOSIS — J9621 Acute and chronic respiratory failure with hypoxia: Secondary | ICD-10-CM | POA: Diagnosis present

## 2017-07-28 DIAGNOSIS — Y95 Nosocomial condition: Secondary | ICD-10-CM | POA: Diagnosis not present

## 2017-07-28 DIAGNOSIS — Z9119 Patient's noncompliance with other medical treatment and regimen: Secondary | ICD-10-CM

## 2017-07-28 DIAGNOSIS — J1008 Influenza due to other identified influenza virus with other specified pneumonia: Secondary | ICD-10-CM | POA: Diagnosis not present

## 2017-07-28 DIAGNOSIS — K219 Gastro-esophageal reflux disease without esophagitis: Secondary | ICD-10-CM | POA: Diagnosis present

## 2017-07-28 DIAGNOSIS — G4733 Obstructive sleep apnea (adult) (pediatric): Secondary | ICD-10-CM | POA: Diagnosis present

## 2017-07-28 DIAGNOSIS — I472 Ventricular tachycardia: Secondary | ICD-10-CM | POA: Diagnosis not present

## 2017-07-28 DIAGNOSIS — F329 Major depressive disorder, single episode, unspecified: Secondary | ICD-10-CM | POA: Diagnosis present

## 2017-07-28 DIAGNOSIS — J9622 Acute and chronic respiratory failure with hypercapnia: Secondary | ICD-10-CM | POA: Diagnosis present

## 2017-07-28 DIAGNOSIS — Z9981 Dependence on supplemental oxygen: Secondary | ICD-10-CM

## 2017-07-28 DIAGNOSIS — J441 Chronic obstructive pulmonary disease with (acute) exacerbation: Principal | ICD-10-CM | POA: Diagnosis present

## 2017-07-28 DIAGNOSIS — J80 Acute respiratory distress syndrome: Secondary | ICD-10-CM | POA: Diagnosis present

## 2017-07-28 DIAGNOSIS — Z7951 Long term (current) use of inhaled steroids: Secondary | ICD-10-CM

## 2017-07-28 DIAGNOSIS — I110 Hypertensive heart disease with heart failure: Secondary | ICD-10-CM | POA: Diagnosis present

## 2017-07-28 DIAGNOSIS — Z91041 Radiographic dye allergy status: Secondary | ICD-10-CM

## 2017-07-28 DIAGNOSIS — E872 Acidosis: Secondary | ICD-10-CM | POA: Diagnosis not present

## 2017-07-28 DIAGNOSIS — Z7901 Long term (current) use of anticoagulants: Secondary | ICD-10-CM

## 2017-07-28 DIAGNOSIS — Z6841 Body Mass Index (BMI) 40.0 and over, adult: Secondary | ICD-10-CM

## 2017-07-28 DIAGNOSIS — F419 Anxiety disorder, unspecified: Secondary | ICD-10-CM | POA: Diagnosis present

## 2017-07-28 DIAGNOSIS — D649 Anemia, unspecified: Secondary | ICD-10-CM | POA: Diagnosis present

## 2017-07-28 DIAGNOSIS — E2609 Other primary hyperaldosteronism: Secondary | ICD-10-CM | POA: Diagnosis present

## 2017-07-28 DIAGNOSIS — N179 Acute kidney failure, unspecified: Secondary | ICD-10-CM | POA: Diagnosis not present

## 2017-07-28 DIAGNOSIS — I158 Other secondary hypertension: Secondary | ICD-10-CM | POA: Diagnosis present

## 2017-07-28 DIAGNOSIS — J440 Chronic obstructive pulmonary disease with acute lower respiratory infection: Secondary | ICD-10-CM | POA: Diagnosis not present

## 2017-07-28 DIAGNOSIS — M25561 Pain in right knee: Secondary | ICD-10-CM | POA: Diagnosis present

## 2017-07-28 DIAGNOSIS — R0603 Acute respiratory distress: Secondary | ICD-10-CM | POA: Diagnosis not present

## 2017-07-28 DIAGNOSIS — J4541 Moderate persistent asthma with (acute) exacerbation: Secondary | ICD-10-CM | POA: Diagnosis present

## 2017-07-28 DIAGNOSIS — Z825 Family history of asthma and other chronic lower respiratory diseases: Secondary | ICD-10-CM

## 2017-07-28 DIAGNOSIS — Z8249 Family history of ischemic heart disease and other diseases of the circulatory system: Secondary | ICD-10-CM

## 2017-07-28 DIAGNOSIS — Z716 Tobacco abuse counseling: Secondary | ICD-10-CM

## 2017-07-28 DIAGNOSIS — R040 Epistaxis: Secondary | ICD-10-CM | POA: Diagnosis not present

## 2017-07-28 DIAGNOSIS — N920 Excessive and frequent menstruation with regular cycle: Secondary | ICD-10-CM | POA: Diagnosis present

## 2017-07-28 DIAGNOSIS — T380X5A Adverse effect of glucocorticoids and synthetic analogues, initial encounter: Secondary | ICD-10-CM | POA: Diagnosis not present

## 2017-07-28 DIAGNOSIS — E1165 Type 2 diabetes mellitus with hyperglycemia: Secondary | ICD-10-CM | POA: Diagnosis not present

## 2017-07-28 DIAGNOSIS — I959 Hypotension, unspecified: Secondary | ICD-10-CM | POA: Diagnosis not present

## 2017-07-28 DIAGNOSIS — Z22322 Carrier or suspected carrier of Methicillin resistant Staphylococcus aureus: Secondary | ICD-10-CM

## 2017-07-28 DIAGNOSIS — E876 Hypokalemia: Secondary | ICD-10-CM | POA: Diagnosis not present

## 2017-07-28 DIAGNOSIS — E875 Hyperkalemia: Secondary | ICD-10-CM | POA: Diagnosis present

## 2017-07-28 LAB — BASIC METABOLIC PANEL
ANION GAP: 12 (ref 5–15)
BUN: 13 mg/dL (ref 6–20)
CHLORIDE: 104 mmol/L (ref 101–111)
CO2: 22 mmol/L (ref 22–32)
Calcium: 8 mg/dL — ABNORMAL LOW (ref 8.9–10.3)
Creatinine, Ser: 0.86 mg/dL (ref 0.44–1.00)
GFR calc Af Amer: >60 mL/min (ref >60)
GLUCOSE: 304 mg/dL — AB (ref 65–99)
POTASSIUM: 3.3 mmol/L — AB (ref 3.5–5.1)
Sodium: 138 mmol/L (ref 135–145)

## 2017-07-28 LAB — I-STAT TROPONIN, ED: Troponin i, poc: 0 ng/mL (ref 0.00–0.08)

## 2017-07-28 LAB — CBC
HEMATOCRIT: 39.6 % (ref 36.0–46.0)
HEMOGLOBIN: 12.2 g/dL (ref 12.0–15.0)
MCH: 26.9 pg (ref 26.0–34.0)
MCHC: 30.8 g/dL (ref 30.0–36.0)
MCV: 87.4 fL (ref 78.0–100.0)
Platelets: 334 10*3/uL (ref 150–400)
RBC: 4.53 MIL/uL (ref 3.87–5.11)
RDW: 17.6 % — ABNORMAL HIGH (ref 11.5–15.5)
WBC: 20.7 10*3/uL — ABNORMAL HIGH (ref 4.0–10.5)

## 2017-07-28 LAB — I-STAT BETA HCG BLOOD, ED (MC, WL, AP ONLY): I-stat hCG, quantitative: <5 m[IU]/mL (ref <5)

## 2017-07-28 MED ORDER — ALBUTEROL SULFATE (2.5 MG/3ML) 0.083% IN NEBU
5.0000 mg | INHALATION_SOLUTION | Freq: Once | RESPIRATORY_TRACT | Status: AC
  Administered 2017-07-28: 5 mg via RESPIRATORY_TRACT
  Filled 2017-07-28: qty 6

## 2017-07-28 NOTE — ED Notes (Signed)
Pt c/o intermittent chest pain, denies pain at this time.

## 2017-07-28 NOTE — Telephone Encounter (Signed)
Call placed to [facility], spoke to [place] who stated that a patient needs to be on home O2 to qualify for a home pulse ox.

## 2017-07-28 NOTE — Telephone Encounter (Signed)
Call placed to the patient to check on her. She said that she called Dr Janee Morn office and is waiting for a call back. She then noted that her phone was going to die and it did. Update provided to Dr Margarita Rana.

## 2017-07-28 NOTE — Telephone Encounter (Signed)
Call received from the patient.She explained that she is thinking that she might need to go to the ED but she is not sure and really doesn't want to go.  She reported shortness of breath for a " few days" and said that her " asthma is real bad."  She noted that she has been taking her medications as ordered and has been using her inhaler and nebulizer treatments but that is not helping.  She said that she has been taking prednisone 10 mg daily but this morning took 20 mg.  This CM inquired if she has contacted her pulmonologist and she said no. Instructed her to report her status ot Dr Annamaria Boots and an update was provided to Dr Margarita Rana. She stated that she would call Dr Annamaria Boots.  She was did not sound short of breath when speaking to this CM.  She also confirmed her appointment at [facility] tomorrow for glucometer teaching.

## 2017-07-28 NOTE — ED Triage Notes (Signed)
Pt c/o shortness of breath x 2 days. Hx asthma, has been using home inhalers with no relief. Wheezing noted.

## 2017-07-29 ENCOUNTER — Telehealth: Payer: Self-pay | Admitting: Family Medicine

## 2017-07-29 ENCOUNTER — Encounter (HOSPITAL_COMMUNITY): Payer: Self-pay | Admitting: Internal Medicine

## 2017-07-29 ENCOUNTER — Inpatient Hospital Stay (HOSPITAL_COMMUNITY)
Admission: EM | Admit: 2017-07-29 | Discharge: 2017-08-06 | DRG: 190 | Disposition: A | Discharge status: Other Acute Inpatient Hospital | Payer: Medicaid Other | Attending: Family Medicine | Admitting: Family Medicine

## 2017-07-29 ENCOUNTER — Ambulatory Visit: Payer: Medicaid Other | Admitting: Pharmacist

## 2017-07-29 ENCOUNTER — Other Ambulatory Visit: Payer: Self-pay

## 2017-07-29 DIAGNOSIS — J111 Influenza due to unidentified influenza virus with other respiratory manifestations: Secondary | ICD-10-CM | POA: Diagnosis not present

## 2017-07-29 DIAGNOSIS — K219 Gastro-esophageal reflux disease without esophagitis: Secondary | ICD-10-CM | POA: Diagnosis present

## 2017-07-29 DIAGNOSIS — F329 Major depressive disorder, single episode, unspecified: Secondary | ICD-10-CM | POA: Diagnosis present

## 2017-07-29 DIAGNOSIS — D72829 Elevated white blood cell count, unspecified: Secondary | ICD-10-CM | POA: Diagnosis not present

## 2017-07-29 DIAGNOSIS — J189 Pneumonia, unspecified organism: Secondary | ICD-10-CM | POA: Diagnosis not present

## 2017-07-29 DIAGNOSIS — F419 Anxiety disorder, unspecified: Secondary | ICD-10-CM | POA: Diagnosis present

## 2017-07-29 DIAGNOSIS — N920 Excessive and frequent menstruation with regular cycle: Secondary | ICD-10-CM | POA: Diagnosis present

## 2017-07-29 DIAGNOSIS — J9622 Acute and chronic respiratory failure with hypercapnia: Secondary | ICD-10-CM | POA: Diagnosis present

## 2017-07-29 DIAGNOSIS — G4733 Obstructive sleep apnea (adult) (pediatric): Secondary | ICD-10-CM | POA: Diagnosis present

## 2017-07-29 DIAGNOSIS — J440 Chronic obstructive pulmonary disease with acute lower respiratory infection: Secondary | ICD-10-CM | POA: Diagnosis not present

## 2017-07-29 DIAGNOSIS — I10 Essential (primary) hypertension: Secondary | ICD-10-CM | POA: Diagnosis present

## 2017-07-29 DIAGNOSIS — I5032 Chronic diastolic (congestive) heart failure: Secondary | ICD-10-CM | POA: Diagnosis not present

## 2017-07-29 DIAGNOSIS — Z720 Tobacco use: Secondary | ICD-10-CM | POA: Diagnosis not present

## 2017-07-29 DIAGNOSIS — R6521 Severe sepsis with septic shock: Secondary | ICD-10-CM | POA: Diagnosis not present

## 2017-07-29 DIAGNOSIS — J4541 Moderate persistent asthma with (acute) exacerbation: Secondary | ICD-10-CM | POA: Diagnosis present

## 2017-07-29 DIAGNOSIS — J4551 Severe persistent asthma with (acute) exacerbation: Secondary | ICD-10-CM | POA: Diagnosis not present

## 2017-07-29 DIAGNOSIS — I472 Ventricular tachycardia: Secondary | ICD-10-CM | POA: Diagnosis not present

## 2017-07-29 DIAGNOSIS — Y95 Nosocomial condition: Secondary | ICD-10-CM | POA: Diagnosis not present

## 2017-07-29 DIAGNOSIS — J80 Acute respiratory distress syndrome: Secondary | ICD-10-CM | POA: Diagnosis present

## 2017-07-29 DIAGNOSIS — J181 Lobar pneumonia, unspecified organism: Secondary | ICD-10-CM | POA: Diagnosis present

## 2017-07-29 DIAGNOSIS — G4700 Insomnia, unspecified: Secondary | ICD-10-CM | POA: Diagnosis present

## 2017-07-29 DIAGNOSIS — E2609 Other primary hyperaldosteronism: Secondary | ICD-10-CM | POA: Diagnosis present

## 2017-07-29 DIAGNOSIS — N179 Acute kidney failure, unspecified: Secondary | ICD-10-CM | POA: Diagnosis not present

## 2017-07-29 DIAGNOSIS — I4891 Unspecified atrial fibrillation: Secondary | ICD-10-CM | POA: Diagnosis not present

## 2017-07-29 DIAGNOSIS — J9621 Acute and chronic respiratory failure with hypoxia: Secondary | ICD-10-CM

## 2017-07-29 DIAGNOSIS — R0600 Dyspnea, unspecified: Secondary | ICD-10-CM

## 2017-07-29 DIAGNOSIS — I158 Other secondary hypertension: Secondary | ICD-10-CM | POA: Diagnosis present

## 2017-07-29 DIAGNOSIS — J302 Other seasonal allergic rhinitis: Secondary | ICD-10-CM | POA: Diagnosis present

## 2017-07-29 DIAGNOSIS — E872 Acidosis: Secondary | ICD-10-CM | POA: Diagnosis not present

## 2017-07-29 DIAGNOSIS — R040 Epistaxis: Secondary | ICD-10-CM | POA: Diagnosis not present

## 2017-07-29 DIAGNOSIS — Z9989 Dependence on other enabling machines and devices: Secondary | ICD-10-CM

## 2017-07-29 DIAGNOSIS — Z6841 Body Mass Index (BMI) 40.0 and over, adult: Secondary | ICD-10-CM | POA: Diagnosis not present

## 2017-07-29 DIAGNOSIS — J9601 Acute respiratory failure with hypoxia: Secondary | ICD-10-CM | POA: Diagnosis present

## 2017-07-29 DIAGNOSIS — I480 Paroxysmal atrial fibrillation: Secondary | ICD-10-CM | POA: Diagnosis not present

## 2017-07-29 DIAGNOSIS — I482 Chronic atrial fibrillation, unspecified: Secondary | ICD-10-CM | POA: Diagnosis present

## 2017-07-29 DIAGNOSIS — E871 Hypo-osmolality and hyponatremia: Secondary | ICD-10-CM | POA: Diagnosis present

## 2017-07-29 DIAGNOSIS — A419 Sepsis, unspecified organism: Secondary | ICD-10-CM | POA: Diagnosis not present

## 2017-07-29 DIAGNOSIS — J441 Chronic obstructive pulmonary disease with (acute) exacerbation: Secondary | ICD-10-CM | POA: Diagnosis present

## 2017-07-29 DIAGNOSIS — I5033 Acute on chronic diastolic (congestive) heart failure: Secondary | ICD-10-CM | POA: Diagnosis present

## 2017-07-29 DIAGNOSIS — J1008 Influenza due to other identified influenza virus with other specified pneumonia: Secondary | ICD-10-CM | POA: Diagnosis not present

## 2017-07-29 DIAGNOSIS — R062 Wheezing: Secondary | ICD-10-CM

## 2017-07-29 DIAGNOSIS — I152 Hypertension secondary to endocrine disorders: Secondary | ICD-10-CM

## 2017-07-29 DIAGNOSIS — J1100 Influenza due to unidentified influenza virus with unspecified type of pneumonia: Secondary | ICD-10-CM | POA: Diagnosis not present

## 2017-07-29 DIAGNOSIS — J45901 Unspecified asthma with (acute) exacerbation: Secondary | ICD-10-CM | POA: Diagnosis not present

## 2017-07-29 LAB — CBG MONITORING, ED
Glucose-Capillary: 179 mg/dL — ABNORMAL HIGH (ref 65–99)
Glucose-Capillary: 230 mg/dL — ABNORMAL HIGH (ref 65–99)

## 2017-07-29 LAB — TROPONIN I: Troponin I: <0.03 ng/mL (ref <0.03)

## 2017-07-29 LAB — GLUCOSE, CAPILLARY
Glucose-Capillary: 258 mg/dL — ABNORMAL HIGH (ref 65–99)
Glucose-Capillary: 253 mg/dL — ABNORMAL HIGH (ref 65–99)

## 2017-07-29 LAB — BRAIN NATRIURETIC PEPTIDE: B NATRIURETIC PEPTIDE 5: 46.6 pg/mL (ref 0.0–100.0)

## 2017-07-29 MED ORDER — ALBUTEROL (5 MG/ML) CONTINUOUS INHALATION SOLN
10.0000 mg/h | INHALATION_SOLUTION | RESPIRATORY_TRACT | Status: DC
  Administered 2017-07-29: 10 mg/h via RESPIRATORY_TRACT
  Filled 2017-07-29: qty 20

## 2017-07-29 MED ORDER — ZOLPIDEM TARTRATE 5 MG PO TABS
10.0000 mg | ORAL_TABLET | Freq: Every day | ORAL | Status: DC
  Administered 2017-07-29 – 2017-07-30 (×2): 10 mg via ORAL
  Filled 2017-07-29 (×2): qty 2

## 2017-07-29 MED ORDER — ONDANSETRON HCL 4 MG/2ML IJ SOLN
4.0000 mg | Freq: Four times a day (QID) | INTRAMUSCULAR | Status: DC | PRN
  Filled 2017-07-29: qty 2

## 2017-07-29 MED ORDER — METOPROLOL TARTRATE 100 MG PO TABS
100.0000 mg | ORAL_TABLET | Freq: Two times a day (BID) | ORAL | Status: DC
  Administered 2017-07-29 – 2017-08-06 (×17): 100 mg via ORAL
  Filled 2017-07-29: qty 1
  Filled 2017-07-29: qty 4
  Filled 2017-07-29 (×15): qty 1

## 2017-07-29 MED ORDER — IPRATROPIUM-ALBUTEROL 0.5-2.5 (3) MG/3ML IN SOLN
3.0000 mL | Freq: Once | RESPIRATORY_TRACT | Status: AC
  Administered 2017-07-29: 3 mL via RESPIRATORY_TRACT
  Filled 2017-07-29: qty 3

## 2017-07-29 MED ORDER — MAGNESIUM SULFATE 2 GM/50ML IV SOLN
2.0000 g | INTRAVENOUS | Status: AC
  Administered 2017-07-29: 2 g via INTRAVENOUS
  Filled 2017-07-29: qty 50

## 2017-07-29 MED ORDER — INSULIN ASPART 100 UNIT/ML ~~LOC~~ SOLN
0.0000 [IU] | Freq: Three times a day (TID) | SUBCUTANEOUS | Status: DC
  Administered 2017-07-29: 3 [IU] via SUBCUTANEOUS
  Administered 2017-07-29: 5 [IU] via SUBCUTANEOUS
  Administered 2017-07-29: 8 [IU] via SUBCUTANEOUS
  Administered 2017-07-30: 5 [IU] via SUBCUTANEOUS
  Administered 2017-07-30: 8 [IU] via SUBCUTANEOUS
  Administered 2017-07-30: 5 [IU] via SUBCUTANEOUS
  Administered 2017-07-31: 11 [IU] via SUBCUTANEOUS
  Administered 2017-07-31 – 2017-08-01 (×2): 5 [IU] via SUBCUTANEOUS
  Administered 2017-08-01: 8 [IU] via SUBCUTANEOUS
  Administered 2017-08-02 (×2): 11 [IU] via SUBCUTANEOUS
  Filled 2017-07-29 (×2): qty 1

## 2017-07-29 MED ORDER — BUDESONIDE 0.25 MG/2ML IN SUSP
0.2500 mg | Freq: Two times a day (BID) | RESPIRATORY_TRACT | Status: DC
  Administered 2017-07-29 – 2017-07-30 (×3): 0.25 mg via RESPIRATORY_TRACT
  Filled 2017-07-29 (×3): qty 2

## 2017-07-29 MED ORDER — SPIRONOLACTONE 25 MG PO TABS
100.0000 mg | ORAL_TABLET | Freq: Every day | ORAL | Status: DC
  Administered 2017-07-29 – 2017-08-06 (×9): 100 mg via ORAL
  Filled 2017-07-29 (×8): qty 4
  Filled 2017-07-29: qty 1

## 2017-07-29 MED ORDER — METHYLPREDNISOLONE SODIUM SUCC 40 MG IJ SOLR
40.0000 mg | Freq: Two times a day (BID) | INTRAMUSCULAR | Status: DC
  Administered 2017-07-29: 40 mg via INTRAVENOUS
  Filled 2017-07-29: qty 1

## 2017-07-29 MED ORDER — BUSPIRONE HCL 5 MG PO TABS
7.5000 mg | ORAL_TABLET | Freq: Two times a day (BID) | ORAL | Status: DC
  Administered 2017-07-29 – 2017-08-05 (×16): 7.5 mg via ORAL
  Filled 2017-07-29 (×6): qty 2
  Filled 2017-07-29: qty 1.5
  Filled 2017-07-29 (×6): qty 2
  Filled 2017-07-29: qty 1
  Filled 2017-07-29 (×2): qty 2

## 2017-07-29 MED ORDER — ONDANSETRON HCL 4 MG PO TABS: 4.0000 mg | ORAL_TABLET | Freq: Four times a day (QID) | ORAL | Status: DC | PRN

## 2017-07-29 MED ORDER — DILTIAZEM HCL ER COATED BEADS 180 MG PO CP24
360.0000 mg | ORAL_CAPSULE | Freq: Every day | ORAL | Status: DC
  Administered 2017-07-29 – 2017-08-06 (×9): 360 mg via ORAL
  Filled 2017-07-29 (×9): qty 2
  Filled 2017-07-29: qty 1

## 2017-07-29 MED ORDER — LORATADINE 10 MG PO TABS
10.0000 mg | ORAL_TABLET | Freq: Every day | ORAL | Status: DC
  Administered 2017-07-29 – 2017-08-06 (×9): 10 mg via ORAL
  Filled 2017-07-29 (×9): qty 1

## 2017-07-29 MED ORDER — IPRATROPIUM-ALBUTEROL 0.5-2.5 (3) MG/3ML IN SOLN
3.0000 mL | Freq: Four times a day (QID) | RESPIRATORY_TRACT | Status: DC
  Administered 2017-07-29 – 2017-08-05 (×30): 3 mL via RESPIRATORY_TRACT
  Filled 2017-07-29 (×30): qty 3

## 2017-07-29 MED ORDER — FLUTICASONE PROPIONATE 50 MCG/ACT NA SUSP
2.0000 | Freq: Every day | NASAL | Status: DC
  Administered 2017-07-29 – 2017-08-05 (×6): 2 via NASAL
  Filled 2017-07-29 (×2): qty 16

## 2017-07-29 MED ORDER — IPRATROPIUM BROMIDE 0.02 % IN SOLN
0.5000 mg | RESPIRATORY_TRACT | Status: DC
  Administered 2017-07-29 (×2): 0.5 mg via RESPIRATORY_TRACT
  Filled 2017-07-29 (×2): qty 2.5

## 2017-07-29 MED ORDER — APIXABAN 5 MG PO TABS
5.0000 mg | ORAL_TABLET | Freq: Two times a day (BID) | ORAL | Status: DC
  Administered 2017-07-29 – 2017-08-06 (×17): 5 mg via ORAL
  Filled 2017-07-29 (×17): qty 1

## 2017-07-29 MED ORDER — GUAIFENESIN ER 600 MG PO TB12
600.0000 mg | ORAL_TABLET | Freq: Two times a day (BID) | ORAL | Status: DC
  Administered 2017-07-29 – 2017-08-06 (×17): 600 mg via ORAL
  Filled 2017-07-29 (×18): qty 1

## 2017-07-29 MED ORDER — ALBUTEROL SULFATE (2.5 MG/3ML) 0.083% IN NEBU
2.5000 mg | INHALATION_SOLUTION | RESPIRATORY_TRACT | Status: DC | PRN
  Administered 2017-07-30 – 2017-08-06 (×6): 2.5 mg via RESPIRATORY_TRACT
  Filled 2017-07-29 (×6): qty 3

## 2017-07-29 MED ORDER — SERTRALINE HCL 100 MG PO TABS
100.0000 mg | ORAL_TABLET | Freq: Every day | ORAL | Status: DC
  Administered 2017-07-29 – 2017-08-06 (×9): 100 mg via ORAL
  Filled 2017-07-29 (×9): qty 1

## 2017-07-29 MED ORDER — PANTOPRAZOLE SODIUM 40 MG PO TBEC
40.0000 mg | DELAYED_RELEASE_TABLET | Freq: Every day | ORAL | Status: DC
  Administered 2017-07-29 – 2017-08-04 (×7): 40 mg via ORAL
  Filled 2017-07-29 (×7): qty 1

## 2017-07-29 MED ORDER — ACETAMINOPHEN 650 MG RE SUPP: 650.0000 mg | Freq: Four times a day (QID) | RECTAL | Status: DC | PRN

## 2017-07-29 MED ORDER — METHYLPREDNISOLONE SODIUM SUCC 125 MG IJ SOLR
60.0000 mg | Freq: Three times a day (TID) | INTRAMUSCULAR | Status: DC
  Administered 2017-07-29 – 2017-07-30 (×3): 60 mg via INTRAVENOUS
  Filled 2017-07-29 (×5): qty 2

## 2017-07-29 MED ORDER — FUROSEMIDE 40 MG PO TABS
40.0000 mg | ORAL_TABLET | Freq: Every day | ORAL | Status: DC
  Administered 2017-07-29 – 2017-08-06 (×9): 40 mg via ORAL
  Filled 2017-07-29 (×6): qty 1
  Filled 2017-07-29: qty 2
  Filled 2017-07-29 (×2): qty 1

## 2017-07-29 MED ORDER — INSULIN GLARGINE 100 UNIT/ML ~~LOC~~ SOLN
20.0000 [IU] | Freq: Every day | SUBCUTANEOUS | Status: DC
  Administered 2017-07-29 – 2017-07-30 (×2): 20 [IU] via SUBCUTANEOUS
  Filled 2017-07-29 (×2): qty 0.2

## 2017-07-29 MED ORDER — ALBUTEROL SULFATE (2.5 MG/3ML) 0.083% IN NEBU
2.5000 mg | INHALATION_SOLUTION | RESPIRATORY_TRACT | Status: DC
  Administered 2017-07-29 (×2): 2.5 mg via RESPIRATORY_TRACT
  Filled 2017-07-29 (×2): qty 3

## 2017-07-29 MED ORDER — FAMOTIDINE 20 MG PO TABS
20.0000 mg | ORAL_TABLET | Freq: Two times a day (BID) | ORAL | Status: DC
  Administered 2017-07-29 – 2017-08-06 (×17): 20 mg via ORAL
  Filled 2017-07-29 (×17): qty 1

## 2017-07-29 MED ORDER — ACETAMINOPHEN 325 MG PO TABS
650.0000 mg | ORAL_TABLET | Freq: Four times a day (QID) | ORAL | Status: DC | PRN
  Administered 2017-07-29 – 2017-08-04 (×9): 650 mg via ORAL
  Filled 2017-07-29 (×10): qty 2

## 2017-07-29 MED ORDER — HYDRALAZINE HCL 20 MG/ML IJ SOLN
10.0000 mg | INTRAMUSCULAR | Status: DC | PRN
  Administered 2017-07-29 – 2017-07-30 (×2): 10 mg via INTRAVENOUS
  Filled 2017-07-29 (×2): qty 1

## 2017-07-29 MED ORDER — MONTELUKAST SODIUM 10 MG PO TABS
10.0000 mg | ORAL_TABLET | Freq: Every day | ORAL | Status: DC
  Administered 2017-07-29 – 2017-08-05 (×8): 10 mg via ORAL
  Filled 2017-07-29 (×8): qty 1

## 2017-07-29 NOTE — Telephone Encounter (Signed)
Call placed to the patient and she informed this CM that she has been admitted and missed her appointment for glucometer teaching today. Informed her that this CM would contact the CM on the floor and inquire if teaching could be done while she is in the hospital.  The patient also noted that her cell phone is dead.  Call placed to Olga Coaster, RN CM  - message left requesting if the patient can be instructed how to use the [employer] Aviva Plus while she is in the hospital.  CM call back # [phone].

## 2017-07-29 NOTE — Telephone Encounter (Signed)
Pt called to say that she is at [facility], Please follow up

## 2017-07-29 NOTE — Progress Notes (Signed)
Patient requesting nicotine patch. When asked how much she smokes per day, patient states "I don't smoke; only when I am stressed." MD notified.

## 2017-07-29 NOTE — Progress Notes (Signed)
Patient ID: April [name], female   DOB: [birth year], 47 y.o.   MRN: [id number] Patient was admitted early this morning for worsening shortness of breath and started on intravenous steroids for asthma exacerbation.  Patient seen and examined at bedside and plan of care discussed with her.  I reviewed the medical records and this morning's H&P myself.  Patient is still wheezing.  Will increase the dose of Solu-Medrol to 60 mg IV every 8 hours.  Repeat a.m. labs.  If respiratory status does not improve, might need pulmonary evaluation.

## 2017-07-29 NOTE — ED Notes (Signed)
Patient ambulated in hallway.  Pt complained of some dizziness O2 dropped to 93% during ambulation.

## 2017-07-29 NOTE — ED Notes (Signed)
Breakfast Tray Ordered @ 0723-per RN-called by Levada Dy

## 2017-07-29 NOTE — H&P (Addendum)
History and Physical    April [name] KVQ:[id number] DOB: 01-20-1971 DOA: 07/29/2017  PCP: [name], MD  Patient coming from: Home.  Chief Complaint: Shortness of breath.  HPI: April [name] is a 47 y.o. female with history of asthma on Xolair, diabetes mellitus type 2 recently diagnosed secondary to steroids, hypertension with hyperaldosteronism on spironolactone, sleep apnea, morbid obesity presents to the ER because of worsening shortness of breath.  Patient was discharged 2 weeks ago after being admitted for respiratory failure secondary to asthma and at that time patient also was diagnosed with atrial fibrillation.  Patient also was placed on Lantus insulin for hyperglycemia likely from persistent use of steroids.  Patient since discharge had followed up with her pulmonologist and primary care doctor.  Patient states she was doing well until last week when she started developing shortness of breath again.  She has been using steroids daily along with a high inhaler.  Denies any productive cough chest pain fever or chills.  Shortness of breath present even at rest with wheezing.  Over the last 1 week patient also noticed some facial swelling denies any facial itching rash or any tongue swelling.  No new medications except for apixaban he started last 2 weeks ago.  ED Course: In the ER patient is found to be wheezing chest x-ray shows bronchitic changes.  Blood pressure is elevated.  Patient was given nebulizer treatment and admitted for asthma exacerbation.  On exam patient's face status looks mildly swollen but no lip swelling or tongue swelling or any stridor.  Review of Systems: As per HPI, rest all negative.   Past Medical History:  Diagnosis Date  . Acanthosis nigricans   . Anxiety   . Arthritis    "knees" (04/28/2017)  . Asthma    Followed by Dr. Annamaria Boots (pulmonology); receives every other week omalizumab injections; has frequent exacerbations  . COPD (chronic obstructive  pulmonary disease) ([place])    PFTs in 2002, FEV1/FVC 65, no post bronchodilater test done  . Depression   . GERD (gastroesophageal reflux disease)   . Headache(784.0)    "q couple days" (04/28/2017)  . Helicobacter pylori (H. pylori) infection   . Hypertension, essential   . Insomnia   . Menorrhagia   . Morbid obesity ([place])   . OSA on CPAP    Sleep study 2008 - mild OSA, not enough events to titrate CPAP; wears CPAP now/pt on 04/28/2017  . Pneumonia X 1  . Seasonal allergies   . Shortness of breath   . Tobacco user     Past Surgical History:  Procedure Laterality Date  . CARDIOVERSION N/A 05/30/2017   Procedure: CARDIOVERSION;  Surgeon: Sanda Klein, MD;  Location: [facility];  Service: Cardiovascular;  Laterality: N/A;  . REDUCTION MAMMAPLASTY Bilateral 09/2011  . TUBAL LIGATION  1996   bilateral     reports that she quit smoking about 2 years ago. Her smoking use included cigarettes. She has a 13.00 pack-year smoking history. She has never used smokeless tobacco. She reports that she does not drink alcohol or use drugs.  Allergies  Allergen Reactions  . Contrast Media [Iodinated Diagnostic Agents] Itching    Ct contrast    Family History  Problem Relation Age of Onset  . Hypertension Mother   . Asthma Daughter   . Cancer Paternal Aunt   . Asthma Maternal Grandmother     Prior to Admission medications   Medication Sig Start Date End Date Taking? Authorizing Provider  ACCU-CHEK SOFTCLIX LANCETS lancets Use as instructed 07/18/17   [name], MD  albuterol (PROVENTIL HFA;VENTOLIN HFA) 108 (90 Base) MCG/ACT inhaler Inhale 2 puffs into the lungs every 4 (four) hours as needed for wheezing or shortness of breath. 07/18/17   [name], MD  apixaban (ELIQUIS) 5 MG TABS tablet Take 1 tablet (5 mg total) by mouth 2 (two) times daily. 07/18/17   [name], MD  arformoterol (BROVANA) 15 MCG/2ML NEBU Take 2 mLs (15 mcg total) by nebulization 2 (two) times daily.  06/08/17   Elwin Mocha, MD  blood glucose meter kit and supplies KIT Dispense based on patient and insurance preference. Use up to four times daily as directed. (FOR ICD-9 250.00, 250.01). 07/12/17   Elodia Florence., MD  Blood Glucose Monitoring Suppl (ACCU-CHEK AVIVA PLUS) w/Device KIT Test blood sugar 4 times a day. 07/18/17   [name], MD  budesonide (PULMICORT) 0.25 MG/2ML nebulizer solution Take 2 mLs (0.25 mg total) by nebulization 2 (two) times daily. 07/04/17   [name], MD  budesonide-formoterol (SYMBICORT) 160-4.5 MCG/ACT inhaler Inhale 2 puffs into the lungs 2 (two) times daily. 07/14/17   Deneise Lever, MD  busPIRone (BUSPAR) 7.5 MG tablet Take 1 tablet (7.5 mg total) 2 (two) times daily by mouth. 03/12/17   [name], MD  butalbital-acetaminophen-caffeine (FIORICET, ESGIC) 50-325-40 MG tablet Take 1-2 tablets by mouth every 6 (six) hours as needed for headache. 12/19/16 12/19/17  Joy, Shawn C, PA-C  diltiazem (CARDIZEM CD) 360 MG 24 hr capsule Take 1 capsule (360 mg total) by mouth daily. 06/25/17   [name], MD  famotidine (PEPCID) 20 MG tablet Take 1 tablet (20 mg total) by mouth 2 (two) times daily. 03/29/17   Ward, Ozella Almond, PA-C  fluticasone (FLONASE) 50 MCG/ACT nasal spray Place 2 sprays into both nostrils daily. 12/13/16   Rai, Vernelle Emerald, MD  furosemide (LASIX) 40 MG tablet Take 1 tablet (40 mg total) daily by mouth. 03/12/17 03/12/18  [name], MD  glipiZIDE (GLUCOTROL) 5 MG tablet Take 0.5 tablets (2.5 mg total) by mouth 2 (two) times daily before a meal. 07/18/17   [name], MD  glucose blood (ACCU-CHEK AVIVA PLUS) test strip Use as instructed 07/18/17   [name], MD  guaiFENesin (MUCINEX) 600 MG 12 hr tablet Take 1 tablet (600 mg total) by mouth 2 (two) times daily. 06/01/17   Regalado, Belkys A, MD  loratadine (CLARITIN) 10 MG tablet Take 1 tablet (10 mg total) by mouth daily. 08/30/15   [name], MD  losartan (COZAAR)  100 MG tablet Take 1 tablet (100 mg total) by mouth daily. 07/18/17   [name], MD  metoprolol tartrate (LOPRESSOR) 100 MG tablet Take 1 tablet (100 mg total) by mouth 2 (two) times daily. 07/18/17   [name], MD  mometasone-formoterol (DULERA) 200-5 MCG/ACT AERO Inhale 2 puffs into the lungs 2 (two) times daily. 07/09/17   Baird Lyons D, MD  montelukast (SINGULAIR) 10 MG tablet Take 1 tablet (10 mg total) by mouth at bedtime. 07/18/17   [name], MD  omeprazole (PRILOSEC) 20 MG capsule Take 1 capsule (20 mg total) by mouth 2 (two) times daily. 04/30/17   Geradine Girt, DO  predniSONE (DELTASONE) 10 MG tablet Take 1 tablet (10 mg total) by mouth daily with breakfast. 07/14/17   Baird Lyons D, MD  sertraline (ZOLOFT) 100 MG tablet Take 1 tablet (100 mg total) by mouth daily. 07/18/17   [name], MD  spironolactone (ALDACTONE) 100 MG tablet Take 1 tablet (100 mg total) by mouth daily. 07/18/17   [name], MD  Vitamin D, Ergocalciferol, (DRISDOL) 50000 units CAPS capsule Take 1 capsule (50,000 Units total) by mouth every 7 (seven) days. 06/07/17   Regalado, Belkys A, MD  zolpidem (AMBIEN) 10 MG tablet Take 10 mg by mouth at bedtime. 09/27/10   [provider]    Physical Exam: Vitals:   07/29/17 0100 07/29/17 0217 07/29/17 0245 07/29/17 0345  BP: (!) 165/75  (!) 172/81 (!) 178/86  Pulse: 82  82 91  Resp: (!) 23  20 (!) 25  Temp:      TempSrc:      SpO2: 97% 100% 100% 98%  Weight:      Height:          Constitutional: Moderately built and nourished. Vitals:   07/29/17 0100 07/29/17 0217 07/29/17 0245 07/29/17 0345  BP: (!) 165/75  (!) 172/81 (!) 178/86  Pulse: 82  82 91  Resp: (!) 23  20 (!) 25  Temp:      TempSrc:      SpO2: 97% 100% 100% 98%  Weight:      Height:       Eyes: Anicteric no pallor. ENMT: No discharge from the ears eyes nose or mouth.  Mild facial swelling not sure if it is chronic or new. Neck: No stridor.  No mass felt.   JVD not appreciated. Respiratory: Bilateral expiratory wheezes no crepitations. Cardiovascular: S1-S2 heard no murmurs appreciated. Abdomen: Soft nontender bowel sounds present. Musculoskeletal: No edema.  No joint effusion. Skin: No rash. Neurologic: Alert awake oriented to time place and person.  Moves all extremities. Psychiatric: Appears normal.  Normal affect.   Labs on Admission: I have personally reviewed following labs and imaging studies  CBC: Recent Labs  Lab 07/28/17 2147  WBC 20.7*  HGB 12.2  HCT 39.6  MCV 87.4  PLT 093   Basic Metabolic Panel: Recent Labs  Lab 07/28/17 2147  NA 138  K 3.3*  CL 104  CO2 22  GLUCOSE 304*  BUN 13  CREATININE 0.86  CALCIUM 8.0*   GFR: Estimated Creatinine Clearance: 127.2 mL/min (by C-G formula based on SCr of 0.86 mg/dL). Liver Function Tests: No results for input(s): AST, ALT, ALKPHOS, BILITOT, PROT, ALBUMIN in the last 168 hours. No results for input(s): LIPASE, AMYLASE in the last 168 hours. No results for input(s): AMMONIA in the last 168 hours. Coagulation Profile: No results for input(s): INR, PROTIME in the last 168 hours. Cardiac Enzymes: No results for input(s): CKTOTAL, CKMB, CKMBINDEX, TROPONINI in the last 168 hours. BNP (last 3 results) No results for input(s): PROBNP in the last 8760 hours. HbA1C: No results for input(s): HGBA1C in the last 72 hours. CBG: No results for input(s): GLUCAP in the last 168 hours. Lipid Profile: No results for input(s): CHOL, HDL, LDLCALC, TRIG, CHOLHDL, LDLDIRECT in the last 72 hours. Thyroid Function Tests: No results for input(s): TSH, T4TOTAL, FREET4, T3FREE, THYROIDAB in the last 72 hours. Anemia Panel: No results for input(s): VITAMINB12, FOLATE, FERRITIN, TIBC, IRON, RETICCTPCT in the last 72 hours. Urine analysis:    Component Value Date/Time   COLORURINE YELLOW 04/29/2017 0519   APPEARANCEUR HAZY (A) 04/29/2017 0519   LABSPEC 1.008 04/29/2017 0519   PHURINE  6.0 04/29/2017 0519   GLUCOSEU NEGATIVE 04/29/2017 0519   GLUCOSEU NEG mg/dL 10/28/2007 2049   HGBUR NEGATIVE 04/29/2017 [place] 04/29/2017 0519  KETONESUR NEGATIVE 04/29/2017 0519   PROTEINUR NEGATIVE 04/29/2017 0519   UROBILINOGEN 1.0 11/21/2014 0707   NITRITE NEGATIVE 04/29/2017 0519   LEUKOCYTESUR NEGATIVE 04/29/2017 0519   Sepsis Labs: '@LABRCNTIP'$ (procalcitonin:4,lacticidven:4) )No results found for this or any previous visit (from the past 240 hour(s)).   Radiological Exams on Admission: Dg Chest 2 View  Result Date: 07/28/2017 CLINICAL DATA:  Chest pain and shortness of breath for 1 day. History of COPD and pneumonia. EXAM: CHEST - 2 VIEW COMPARISON:  Chest radiograph August 07, 2017 FINDINGS: Cardiomediastinal silhouette is normal. No pleural effusions or focal consolidations. Bronchitic changes. Trachea projects midline and there is no pneumothorax. Soft tissue planes and included osseous structures are non-suspicious. Large body habitus. IMPRESSION: Bronchitic changes without focal consolidation. Electronically Signed   By: Elon Alas M.D.   On: 07/28/2017 22:22    EKG: Independently reviewed.  Sinus tachycardia.  Assessment/Plan Active Problems:   Essential hypertension   OSA on CPAP   Malignant hypertension due to primary aldosteronism (HCC)   Moderate persistent asthma with exacerbation   Chronic diastolic CHF (congestive heart failure) (HCC)   Acute respiratory failure with hypoxia (HCC)   Unspecified atrial fibrillation ([place])    1. Acute respiratory failure with hypoxia likely from asthma exacerbation -patient has been placed on IV steroids nebulizers Pulmicort.  Closely follow respiratory status.  Patient able to talk in full sentences. 2. Facial swelling -no definite signs of any angioedema.  Will check urine analysis for any proteinuria.  For now I am holding of Cozaar and if there is no definite evidence of any angioedema will restart  Cozaar. 3. Hypertension uncontrolled -patient is on Cardizem metoprolol.  Holding Cozaar due to #2.  Will keep patient on PRN IV hydralazine for systolic blood pressure more than 160.  Closely follow blood pressure trends.  Probably part attributed to patient's symptoms of shortness of breath. 4. Atrial fibrillation presently on Cardizem and metoprolol.  On apixaban.  Rate controlled presently in sinus rhythm. 5. Chronic diastolic CHF on Lasix.  Will check BNP.  Symptoms are more likely from asthma.  Last EF measured was in June 2018 55-60% with grade 1 diastolic dysfunction. 6. Diabetes mellitus type 2 last hemoglobin A1c recorded 3 weeks ago was 7.2.  Patient has not been taking her insulin.  Patient states she was prescribed oral hypoglycemics which she was supposed to start from tomorrow.  Since patient is admitted and is on IV steroids now and patient is already hyperglycemic I have started patient on Lantus 20 units with moderate dose sliding scale coverage.  Closely follow metabolic panel. 7. OSA on CPAP. 8. Morbid obesity. 9. Leukocytosis likely from steroid use.  No definite signs of any infection.   DVT prophylaxis: Apixaban. Code Status: Full code. Family Communication: Discussed with patient. Disposition Plan: Home. Consults called: None. Admission status: Inpatient.   [name] MD [facility] Pager [phone].  If 7PM-7AM, please contact night-coverage [email or web address] Password [facility]  07/29/2017, 6:28 AM

## 2017-07-29 NOTE — ED Notes (Signed)
Pt CBG 179, notified Rod Holler Investment banker, corporate)

## 2017-07-29 NOTE — Telephone Encounter (Signed)
Patient called and requested to speak with Opal Sidles. Patient was informed that Opal Sidles was still on her lunch and would relay message. Patient requested for jane to call her if possible. Patient stated she is currently at the hospital and was waiting to be roomed. Patient requested for jane to call her back on phone number [phone], due to her phone not having service. Please fu at your earliest convenience.

## 2017-07-29 NOTE — ED Notes (Signed)
Doctor at bedside.

## 2017-07-29 NOTE — ED Notes (Signed)
Drinks offered as requested

## 2017-07-29 NOTE — Progress Notes (Signed)
Patient states she will have RN contact RT if she decides she wants to wear CPAP tonight.

## 2017-07-29 NOTE — ED Provider Notes (Signed)
[facility] EMERGENCY DEPARTMENT Provider Note   CSN: [id number] Arrival date & time: 07/28/17  2133     History   Chief Complaint Chief Complaint  Patient presents with  . Shortness of Breath    HPI April [name] is a 47 y.o. female.  Patient presents to the emergency department with a chief complaint of shortness of breath.  She states that her COPD started to flare back up over the past week.  She was admitted about 2 weeks ago for the same.  She has been on steroids and on antibiotics.  She followed up with her pulmonologist, but did not have any new medication changes.  She has been compliant in taking her medications.  She states that she called her pulmonologist again today after symptoms continue to worsen, and was advised to come to the emergency department if she needed to.  She states that she feels very out of breath.  Her symptoms do not improve with her breathing treatments her home regimen.  She denies productive cough or fever.  She states that her face is puffy from taking so much prednisone.  She took 40 mg prior to arrival.  The history is provided by the patient. No language interpreter was used.    Past Medical History:  Diagnosis Date  . Acanthosis nigricans   . Anxiety   . Arthritis    "knees" (04/28/2017)  . Asthma    Followed by Dr. Annamaria Boots (pulmonology); receives every other week omalizumab injections; has frequent exacerbations  . COPD (chronic obstructive pulmonary disease) ([place])    PFTs in 2002, FEV1/FVC 65, no post bronchodilater test done  . Depression   . GERD (gastroesophageal reflux disease)   . Headache(784.0)    "q couple days" (04/28/2017)  . Helicobacter pylori (H. pylori) infection   . Hypertension, essential   . Insomnia   . Menorrhagia   . Morbid obesity ([place])   . OSA on CPAP    Sleep study 2008 - mild OSA, not enough events to titrate CPAP; wears CPAP now/pt on 04/28/2017  . Pneumonia X 1  . Seasonal allergies   .  Shortness of breath   . Tobacco user     Patient Active Problem List   Diagnosis Date Noted  . Acute respiratory failure with hypoxia ([place]) 07/07/2017  . Hyperglycemia, drug-induced 06/06/2017  . New onset atrial fibrillation ([place])   . COPD with acute exacerbation ([place]) 02/10/2017  . Dyspnea 02/04/2017  . Chronic diastolic CHF (congestive heart failure) ([place]) 01/17/2017  . SIRS (systemic inflammatory response syndrome) ([place]) 01/17/2017  . Asthma exacerbation 01/17/2017  . SOB (shortness of breath)   . Moderate persistent asthma with exacerbation 10/19/2016  . Normocytic anemia 10/02/2016  . Malignant hypertension due to primary aldosteronism ([place]) 05/05/2016  . Elevated hemoglobin A1c 05/05/2016  . GERD (gastroesophageal reflux disease) 08/30/2015  . Anxiety and depression 08/30/2015  . Seasonal allergic rhinitis 08/29/2013  . Leukocytosis 10/21/2012  . Tobacco abuse in remission 10/07/2012  . Knee pain, bilateral 04/25/2011  . Primary insomnia 03/14/2011  . OSA on CPAP 12/19/2010  . Hypokalemia 08/13/2010  . Cervical back pain with evidence of disc disease 04/08/2008  . Essential hypertension 07/31/2006  . Morbid obesity with body mass index of 50.0-59.9 in adult ([place]) 06/17/2006  . Major depressive disorder, recurrent episode ([place]) 04/10/2006    Past Surgical History:  Procedure Laterality Date  . CARDIOVERSION N/A 05/30/2017   Procedure: CARDIOVERSION;  Surgeon: Sanda Klein, MD;  Location: [facility] ENDOSCOPY;  Service: Cardiovascular;  Laterality: N/A;  . REDUCTION MAMMAPLASTY Bilateral 09/2011  . TUBAL LIGATION  1996   bilateral     OB History   None      Home Medications    Prior to Admission medications   Medication Sig Start Date End Date Taking? Authorizing Provider  ACCU-CHEK SOFTCLIX LANCETS lancets Use as instructed 07/18/17   [name], MD  albuterol (PROVENTIL HFA;VENTOLIN HFA) 108 (90 Base) MCG/ACT inhaler Inhale 2 puffs into the lungs every 4  (four) hours as needed for wheezing or shortness of breath. 07/18/17   [name], MD  apixaban (ELIQUIS) 5 MG TABS tablet Take 1 tablet (5 mg total) by mouth 2 (two) times daily. 07/18/17   [name], MD  arformoterol (BROVANA) 15 MCG/2ML NEBU Take 2 mLs (15 mcg total) by nebulization 2 (two) times daily. 06/08/17   Elwin Mocha, MD  blood glucose meter kit and supplies KIT Dispense based on patient and insurance preference. Use up to four times daily as directed. (FOR ICD-9 250.00, 250.01). 07/12/17   Elodia Florence., MD  Blood Glucose Monitoring Suppl (ACCU-CHEK AVIVA PLUS) w/Device KIT Test blood sugar 4 times a day. 07/18/17   [name], MD  budesonide (PULMICORT) 0.25 MG/2ML nebulizer solution Take 2 mLs (0.25 mg total) by nebulization 2 (two) times daily. 07/04/17   [name], MD  budesonide-formoterol (SYMBICORT) 160-4.5 MCG/ACT inhaler Inhale 2 puffs into the lungs 2 (two) times daily. 07/14/17   Deneise Lever, MD  busPIRone (BUSPAR) 7.5 MG tablet Take 1 tablet (7.5 mg total) 2 (two) times daily by mouth. 03/12/17   [name], MD  butalbital-acetaminophen-caffeine (FIORICET, ESGIC) 50-325-40 MG tablet Take 1-2 tablets by mouth every 6 (six) hours as needed for headache. 12/19/16 12/19/17  Joy, Shawn C, PA-C  diltiazem (CARDIZEM CD) 360 MG 24 hr capsule Take 1 capsule (360 mg total) by mouth daily. 06/25/17   [name], MD  famotidine (PEPCID) 20 MG tablet Take 1 tablet (20 mg total) by mouth 2 (two) times daily. 03/29/17   Ward, Ozella Almond, PA-C  fluticasone (FLONASE) 50 MCG/ACT nasal spray Place 2 sprays into both nostrils daily. 12/13/16   Rai, Vernelle Emerald, MD  furosemide (LASIX) 40 MG tablet Take 1 tablet (40 mg total) daily by mouth. 03/12/17 03/12/18  [name], MD  glipiZIDE (GLUCOTROL) 5 MG tablet Take 0.5 tablets (2.5 mg total) by mouth 2 (two) times daily before a meal. 07/18/17   [name], MD  glucose blood (ACCU-CHEK AVIVA PLUS) test  strip Use as instructed 07/18/17   [name], MD  guaiFENesin (MUCINEX) 600 MG 12 hr tablet Take 1 tablet (600 mg total) by mouth 2 (two) times daily. 06/01/17   Regalado, Belkys A, MD  loratadine (CLARITIN) 10 MG tablet Take 1 tablet (10 mg total) by mouth daily. 08/30/15   [name], MD  losartan (COZAAR) 100 MG tablet Take 1 tablet (100 mg total) by mouth daily. 07/18/17   [name], MD  metoprolol tartrate (LOPRESSOR) 100 MG tablet Take 1 tablet (100 mg total) by mouth 2 (two) times daily. 07/18/17   [name], MD  mometasone-formoterol (DULERA) 200-5 MCG/ACT AERO Inhale 2 puffs into the lungs 2 (two) times daily. 07/09/17   Baird Lyons D, MD  montelukast (SINGULAIR) 10 MG tablet Take 1 tablet (10 mg total) by mouth at bedtime. 07/18/17   [name], MD  omeprazole (PRILOSEC) 20 MG capsule Take 1 capsule (20 mg  total) by mouth 2 (two) times daily. 04/30/17   Geradine Girt, DO  predniSONE (DELTASONE) 10 MG tablet Take 1 tablet (10 mg total) by mouth daily with breakfast. 07/14/17   Baird Lyons D, MD  sertraline (ZOLOFT) 100 MG tablet Take 1 tablet (100 mg total) by mouth daily. 07/18/17   [name], MD  spironolactone (ALDACTONE) 100 MG tablet Take 1 tablet (100 mg total) by mouth daily. 07/18/17   [name], MD  Vitamin D, Ergocalciferol, (DRISDOL) 50000 units CAPS capsule Take 1 capsule (50,000 Units total) by mouth every 7 (seven) days. 06/07/17   Regalado, Belkys A, MD  zolpidem (AMBIEN) 10 MG tablet Take 10 mg by mouth at bedtime. 09/27/10   [provider]    Family History Family History  Problem Relation Age of Onset  . Hypertension Mother   . Asthma Daughter   . Cancer Paternal Aunt   . Asthma Maternal Grandmother     Social History Social History   Tobacco Use  . Smoking status: Former Smoker    Packs/day: 0.50    Years: 26.00    Pack years: 13.00    Types: Cigarettes    Last attempt to quit: 09/12/2014    Years since quitting: 2.8    . Smokeless tobacco: Never Used  Substance Use Topics  . Alcohol use: No  . Drug use: No     Allergies   Patient has no known allergies.   Review of Systems Review of Systems  All other systems reviewed and are negative.    Physical Exam Updated Vital Signs BP (!) 151/99   Pulse (!) 110   Temp 98.8 F (37.1 C) (Oral)   Resp 20   Ht _0  (1.676 m)   Wt (!) 152.4 kg (336 lb)   LMP 07/07/2017   SpO2 100%   BMI 54.23 kg/m   Physical Exam  Constitutional: She is oriented to person, place, and time. She appears well-developed and well-nourished.  HENT:  Head: Normocephalic and atraumatic.  Eyes: Pupils are equal, round, and reactive to light. Conjunctivae and EOM are normal.  Neck: Normal range of motion. Neck supple.  Cardiovascular: Normal rate and regular rhythm. Exam reveals no gallop and no friction rub.  No murmur heard. Pulmonary/Chest: Effort normal. No respiratory distress. She has decreased breath sounds. She has wheezes. She has no rales. She exhibits no tenderness.  Abdominal: Soft. Bowel sounds are normal. She exhibits no distension and no mass. There is no tenderness. There is no rebound and no guarding.  Musculoskeletal: Normal range of motion. She exhibits no edema or tenderness.  Neurological: She is alert and oriented to person, place, and time.  Skin: Skin is warm and dry.  Psychiatric: She has a normal mood and affect. Her behavior is normal. Judgment and thought content normal.  Nursing note and vitals reviewed.    ED Treatments / Results  Labs (all labs ordered are listed, but only abnormal results are displayed) Labs Reviewed  BASIC METABOLIC PANEL - Abnormal; Notable for the following components:      Result Value   Potassium 3.3 (*)    Glucose, Bld 304 (*)    Calcium 8.0 (*)    All other components within normal limits  CBC - Abnormal; Notable for the following components:   WBC 20.7 (*)    RDW 17.6 (*)    All other components within  normal limits  I-STAT TROPONIN, ED  I-STAT BETA HCG BLOOD, ED ([facility], [facility],  [facility] ONLY)    EKG EKG Interpretation  Date/Time:  Monday July 28 2017 21:41:53 EDT Ventricular Rate:  101 PR Interval:  126 QRS Duration: 88 QT Interval:  368 QTC Calculation: 477 R Axis:   -25 Text Interpretation:  Sinus tachycardia Right atrial enlargement Nonspecific ST and T wave abnormality Abnormal ECG When compared with ECG of 07/07/2017, No significant change was found Confirmed by Delora Fuel ([id number]) on 07/29/2017 12:48:07 AM   Radiology Dg Chest 2 View  Result Date: 07/28/2017 CLINICAL DATA:  Chest pain and shortness of breath for 1 day. History of COPD and pneumonia. EXAM: CHEST - 2 VIEW COMPARISON:  Chest radiograph August 07, 2017 FINDINGS: Cardiomediastinal silhouette is normal. No pleural effusions or focal consolidations. Bronchitic changes. Trachea projects midline and there is no pneumothorax. Soft tissue planes and included osseous structures are non-suspicious. Large body habitus. IMPRESSION: Bronchitic changes without focal consolidation. Electronically Signed   By: Elon Alas M.D.   On: 07/28/2017 22:22    Procedures Procedures (including critical care time) CRITICAL CARE Performed by: Montine Circle Patient required multiple breathing treatments and an hour long continuous albuterol treatment with multiple reassessments and persistent tachypnea and dyspnea.  Total critical care time: 38 minutes  Critical care time was exclusive of separately billable procedures and treating other patients.  Critical care was necessary to treat or prevent imminent or life-threatening deterioration.  Critical care was time spent personally by me on the following activities: development of treatment plan with patient and/or surrogate as well as nursing, discussions with consultants, evaluation of patient's response to treatment, examination of patient, obtaining history from patient or surrogate, ordering  and performing treatments and interventions, ordering and review of laboratory studies, ordering and review of radiographic studies, pulse oximetry and re-evaluation of patient's condition.  Medications Ordered in ED Medications  ipratropium-albuterol (DUONEB) 0.5-2.5 (3) MG/3ML nebulizer solution 3 mL (has no administration in time range)  albuterol (PROVENTIL) (2.5 MG/3ML) 0.083% nebulizer solution 5 mg (5 mg Nebulization Given 07/28/17 2146)     Initial Impression / Assessment and Plan / ED Course  I have reviewed the triage vital signs and the nursing notes.  Pertinent labs & imaging results that were available during my care of the patient were reviewed by me and considered in my medical decision making (see chart for details).     Patient with recurrent COPD exacerbation.  She has diminished lung sounds throughout, and is very winded on exam, she speaks in short sentences.  She was admitted about 2 weeks ago for the same.  She has been on steroids and antibiotics since then.  She followed up with her pulmonologist, and has been taking all of her medications as directed.  She states that her symptoms have been intermittent this week, but began to worsen over the past couple of days.  When she called her pulmonologist today, he advised that she come to the emergency department.  Patient given nebulizer treatment in triage with no relief.  Will give additional nebs.  She did take prednisone prior to arrival, will hold off on this for now.  Patient still is very tachypneic and still has a few wheezes after second breathing treatment, will give continuous albuterol treatment.  After continuous albuterol treatment, the patient is able to ambulate maintaining O2 saturation of 93%, however she is complaining of lightheadedness/dizziness and is still profoundly tachypneic and dyspneic speaking in short sentences after ambulation.  I believe that she will need to be admitted for further  treatment.  Appreciate Dr. Hal Hope for seeing the patient.  Final Clinical Impressions(s) / ED Diagnoses   Final diagnoses:  COPD exacerbation [facility])    ED Discharge Orders    None       Montine Circle, PA-C 32/91/91 [id number]    Delora Fuel, MD 00/45/99 [phone]

## 2017-07-30 ENCOUNTER — Telehealth: Payer: Self-pay

## 2017-07-30 DIAGNOSIS — G4733 Obstructive sleep apnea (adult) (pediatric): Secondary | ICD-10-CM

## 2017-07-30 DIAGNOSIS — Z9989 Dependence on other enabling machines and devices: Secondary | ICD-10-CM

## 2017-07-30 DIAGNOSIS — I480 Paroxysmal atrial fibrillation: Secondary | ICD-10-CM

## 2017-07-30 DIAGNOSIS — I5032 Chronic diastolic (congestive) heart failure: Secondary | ICD-10-CM

## 2017-07-30 LAB — URINALYSIS, ROUTINE W REFLEX MICROSCOPIC
BILIRUBIN URINE: NEGATIVE
Bacteria, UA: NONE SEEN
GLUCOSE, UA: NEGATIVE mg/dL
Ketones, ur: NEGATIVE mg/dL
LEUKOCYTES UA: NEGATIVE
Nitrite: NEGATIVE
PH: 6 (ref 5.0–8.0)
Protein, ur: NEGATIVE mg/dL
SPECIFIC GRAVITY, URINE: 1.024 (ref 1.005–1.030)
Squamous Epithelial / LPF: NONE SEEN

## 2017-07-30 LAB — GLUCOSE, CAPILLARY
GLUCOSE-CAPILLARY: 277 mg/dL — AB (ref 65–99)
GLUCOSE-CAPILLARY: 243 mg/dL — AB (ref 65–99)
Glucose-Capillary: 177 mg/dL — ABNORMAL HIGH (ref 65–99)
Glucose-Capillary: 243 mg/dL — ABNORMAL HIGH (ref 65–99)
Glucose-Capillary: 493 mg/dL — ABNORMAL HIGH (ref 65–99)

## 2017-07-30 LAB — BASIC METABOLIC PANEL
ANION GAP: 13 (ref 5–15)
BUN: 9 mg/dL (ref 6–20)
CALCIUM: 8.4 mg/dL — AB (ref 8.9–10.3)
CO2: 25 mmol/L (ref 22–32)
Chloride: 99 mmol/L — ABNORMAL LOW (ref 101–111)
Creatinine, Ser: 0.67 mg/dL (ref 0.44–1.00)
GLUCOSE: 239 mg/dL — AB (ref 65–99)
POTASSIUM: 3.8 mmol/L (ref 3.5–5.1)
SODIUM: 137 mmol/L (ref 135–145)

## 2017-07-30 LAB — CBC
HEMATOCRIT: 37.3 % (ref 36.0–46.0)
HEMOGLOBIN: 11.6 g/dL — AB (ref 12.0–15.0)
MCH: 27 pg (ref 26.0–34.0)
MCHC: 31.1 g/dL (ref 30.0–36.0)
MCV: 86.7 fL (ref 78.0–100.0)
Platelets: 356 10*3/uL (ref 150–400)
RBC: 4.3 MIL/uL (ref 3.87–5.11)
RDW: 17.6 % — ABNORMAL HIGH (ref 11.5–15.5)
WBC: 17.4 10*3/uL — AB (ref 4.0–10.5)

## 2017-07-30 LAB — MAGNESIUM: Magnesium: 2.1 mg/dL (ref 1.7–2.4)

## 2017-07-30 MED ORDER — METHYLPREDNISOLONE SODIUM SUCC 125 MG IJ SOLR
80.0000 mg | Freq: Three times a day (TID) | INTRAMUSCULAR | Status: DC
  Administered 2017-07-30 (×2): 80 mg via INTRAVENOUS
  Filled 2017-07-30 (×2): qty 2

## 2017-07-30 MED ORDER — INSULIN ASPART 100 UNIT/ML ~~LOC~~ SOLN
18.0000 [IU] | Freq: Once | SUBCUTANEOUS | Status: AC
  Administered 2017-07-30: 18 [IU] via SUBCUTANEOUS

## 2017-07-30 MED ORDER — MOMETASONE FURO-FORMOTEROL FUM 200-5 MCG/ACT IN AERO
2.0000 | INHALATION_SPRAY | Freq: Two times a day (BID) | RESPIRATORY_TRACT | Status: DC
  Administered 2017-07-30 – 2017-08-06 (×14): 2 via RESPIRATORY_TRACT
  Filled 2017-07-30 (×3): qty 8.8

## 2017-07-30 MED ORDER — INSULIN GLARGINE 100 UNIT/ML ~~LOC~~ SOLN
25.0000 [IU] | Freq: Every day | SUBCUTANEOUS | Status: DC
  Administered 2017-07-31: 25 [IU] via SUBCUTANEOUS
  Filled 2017-07-30: qty 0.25

## 2017-07-30 MED ORDER — LOSARTAN POTASSIUM 50 MG PO TABS
100.0000 mg | ORAL_TABLET | Freq: Every day | ORAL | Status: DC
  Administered 2017-07-30 – 2017-08-06 (×8): 100 mg via ORAL
  Filled 2017-07-30 (×8): qty 2

## 2017-07-30 MED ORDER — INSULIN ASPART 100 UNIT/ML ~~LOC~~ SOLN
5.0000 [IU] | Freq: Three times a day (TID) | SUBCUTANEOUS | Status: DC
  Administered 2017-07-30: 5 [IU] via SUBCUTANEOUS

## 2017-07-30 NOTE — Progress Notes (Signed)
Pt states she went out of hospital to get food from her daughter.   Staff observed patient coming up the hill at [facility] entrance.  Pt advised this maneuver is against hospital policy

## 2017-07-30 NOTE — Progress Notes (Signed)
Patient located by staff. States she went to get food.

## 2017-07-30 NOTE — Progress Notes (Signed)
PT Cancellation Note  Patient Details Name: April [name] MRN: [id number] DOB: Oct 30, 1970   Cancelled Treatment:    Reason Eval/Treat Not Completed: PT screened, no needs identified, will sign off. Pt mobilizing in her room independently. Encouraged her to mobilize in the halls. No skilled PT needed.   [place] 07/30/2017, 11:57 AM Suanne Marker PT [phone]

## 2017-07-30 NOTE — Progress Notes (Signed)
Notified by tech that patient was not in her room.  Scriber went to room 27, pt clothing gone. Purse remains in room with items in it.   Security called, immediately, no call back at this time.   Will notify MD.

## 2017-07-30 NOTE — Telephone Encounter (Signed)
Call received from the patient stating that she would like to take a shower and they are not permitting her to shower. She also said that she needs a nicotine patch as she has one every time she is in the hospital.  Informed her that this information would be shared with the nurse CM.  Call placed to Olga Coaster, RN CM and informed her of the patient's requests.

## 2017-07-30 NOTE — Progress Notes (Signed)
Inpatient Diabetes Program Recommendations  AACE/ADA: New Consensus Statement on Inpatient Glycemic Control (2015)  Target Ranges:  Prepandial:   less than 140 mg/dL      Peak postprandial:   less than 180 mg/dL (1-2 hours)      Critically ill patients:  140 - 180 mg/dL   Lab Results  Component Value Date   GLUCAP 277 (H) 07/30/2017   HGBA1C 7.2 (H) 07/07/2017    Review of Glycemic Control Results for April [name], April [name] (MRN [id number]) as of 07/30/2017 10:01  Ref. Range 07/29/2017 07:56 07/29/2017 12:18 07/29/2017 17:08 07/29/2017 20:56 07/30/2017 07:33  Glucose-Capillary Latest Ref Range: 65 - 99 mg/dL 179 (H) 230 (H) 258 (H) 253 (H) 277 (H)   Diabetes history: DM2 Outpatient Diabetes medications: Glipizide 2.5 mg BID + ?Lantus 15 units qd Current orders for Inpatient glycemic control: Lantus 20 units + Novolog moderate correction tid  Inpatient Diabetes Program Recommendations:    While on steroids, Add Novolog 5 units meal coverage tid if eats 50%  Thank you, Nani Gasser. Joscelyne Renville, RN, MSN, CDE  Diabetes Coordinator Inpatient Glycemic Control Team Team Pager [phone] (8am-5pm) 07/30/2017 10:18 AM

## 2017-07-30 NOTE — Progress Notes (Signed)
Pt refuses CPAP 

## 2017-07-30 NOTE — Plan of Care (Signed)
  Problem: Clinical Measurements: Goal: Ability to maintain clinical measurements within normal limits will improve Outcome: Progressing   Problem: Clinical Measurements: Goal: Respiratory complications will improve Outcome: Progressing   

## 2017-07-30 NOTE — Progress Notes (Signed)
Patient ID: April [name], female   DOB: 12-26-1970, 47 y.o.   MRN: [id number]  PROGRESS NOTE    April [name]  NWG:[id number] DOB: 08-13-1970 DOA: 07/29/2017 PCP: [name], MD   Brief Narrative:  47 year old female with history of asthma, diabetes mellitus type 2 recently diagnosed secondary to steroids, hypertension with hyperaldosteronism on spironolactone, sleep apnea, morbid obesity, recent discharge 2 weeks ago after being admitted for respiratory failure secondary to asthma and was diagnosed with atrial fibrillation as well presented with worsening shortness of breath.  She was admitted with asthma exacerbation and started on intravenous steroids.   Assessment & Plan:   Principal Problem:   Acute respiratory failure with hypoxia (HCC) Active Problems:   Essential hypertension   OSA on CPAP   Malignant hypertension due to primary aldosteronism (HCC)   Moderate persistent asthma with exacerbation   Chronic diastolic CHF (congestive heart failure) (HCC)   Unspecified atrial fibrillation (HCC)   Acute respiratory failure with hypoxia from asthma exacerbation -Currently requiring 4-5 L oxygen at night.  Wean off as able -If respiratory status worsens, will get pulmonary evaluation  Asthma exacerbation -Still wheezing diffusely.  Increase Solu-Medrol to 80 mg IV every 8 hours.  Continue Pulmicort, loratadine, montelukast and duonebs  Hypertension -Blood pressure still on the higher side.  Continue Cardizem, Lasix, spironolactone and metoprolol.  Resume losartan at the facial swelling might be nonspecific  Paroxysmal atrial fibrillation -Status post cardioversion in February 2019 -Currently rate controlled -Continue Eliquis, Cardizem and metoprolol  Chronic diastolic CHF -Strict input and output.  Daily weights.  Fluid restriction. -Echo on 10/03/2016: EF of 55-60% with normal RV size and systolic function -Continue Lasix, metoprolol, spironolactone  Diabetes mellitus type  II -Hemoglobin A1c 3 weeks ago was 7.2 -Increase Lantus to 25 units daily.  Add NovoLog with meals.  Continue sliding scale coverage.  Obstructive sleep apnea on CPAP  Morbid obesity -Outpatient follow-up  Leukocytosis -Probably reactive from steroid use.  Monitor    DVT prophylaxis: Eliquis Code Status: Full Family Communication: None at bedside  disposition Plan: Home in 1-3 days  Consultants: None  Procedures: None  Antimicrobials: None   Subjective: Patient seen and examined at bedside.  She feels slightly better.  She still short of breath and wheezing.  No overnight fever or vomiting.  Objective: Vitals:   07/30/17 0037 07/30/17 0102 07/30/17 0504 07/30/17 0817  BP: (!) 167/99  (!) 158/80   Pulse:   73   Resp:   18   Temp:   97.8 F (36.6 C)   TempSrc:   Oral   SpO2:  100% 100% 96%  Weight:   (!) 160.7 kg (354 lb 3.2 oz)   Height:        Intake/Output Summary (Last 24 hours) at 07/30/2017 1140 Last data filed at 07/30/2017 1041 Gross per 24 hour  Intake 820 ml  Output 302 ml  Net 518 ml   Filed Weights   07/28/17 2139 07/29/17 1420 07/30/17 0504  Weight: (!) 152.4 kg (336 lb) (!) 160.6 kg (354 lb 1.6 oz) (!) 160.7 kg (354 lb 3.2 oz)    Examination:  General exam: No acute distress. Respiratory system: Bilateral decreased breath sound at bases with diffuse wheezing and some crackles Cardiovascular system: S1 & S2 heard, rate controlled  gastrointestinal system: Abdomen is morbidly obese, nondistended, soft and nontender. Normal bowel sounds heard. Central nervous system: Alert and oriented. No focal neurological deficits. Moving extremities Extremities: No cyanosis, clubbing;  trace edema  skin: No rashes, lesions or ulcers Psychiatry: Judgement and insight appear normal. Mood & affect appropriate.     Data Reviewed: I have personally reviewed following labs and imaging studies  CBC: Recent Labs  Lab 07/28/17 2147 07/30/17 0437  WBC 20.7*  17.4*  HGB 12.2 11.6*  HCT 39.6 37.3  MCV 87.4 86.7  PLT 334 229   Basic Metabolic Panel: Recent Labs  Lab 07/28/17 2147 07/30/17 0437  NA 138 137  K 3.3* 3.8  CL 104 99*  CO2 22 25  GLUCOSE 304* 239*  BUN 13 9  CREATININE 0.86 0.67  CALCIUM 8.0* 8.4*  MG  --  2.1   GFR: Estimated Creatinine Clearance: 141.5 mL/min (by C-G formula based on SCr of 0.67 mg/dL). Liver Function Tests: No results for input(s): AST, ALT, ALKPHOS, BILITOT, PROT, ALBUMIN in the last 168 hours. No results for input(s): LIPASE, AMYLASE in the last 168 hours. No results for input(s): AMMONIA in the last 168 hours. Coagulation Profile: No results for input(s): INR, PROTIME in the last 168 hours. Cardiac Enzymes: Recent Labs  Lab 07/29/17 0729  TROPONINI <0.03   BNP (last 3 results) No results for input(s): PROBNP in the last 8760 hours. HbA1C: No results for input(s): HGBA1C in the last 72 hours. CBG: Recent Labs  Lab 07/29/17 1218 07/29/17 1708 07/29/17 2056 07/30/17 0733 07/30/17 1123  GLUCAP 230* 258* 253* 277* 243*   Lipid Profile: No results for input(s): CHOL, HDL, LDLCALC, TRIG, CHOLHDL, LDLDIRECT in the last 72 hours. Thyroid Function Tests: No results for input(s): TSH, T4TOTAL, FREET4, T3FREE, THYROIDAB in the last 72 hours. Anemia Panel: No results for input(s): VITAMINB12, FOLATE, FERRITIN, TIBC, IRON, RETICCTPCT in the last 72 hours. Sepsis Labs: No results for input(s): PROCALCITON, LATICACIDVEN in the last 168 hours.  No results found for this or any previous visit (from the past 240 hour(s)).       Radiology Studies: Dg Chest 2 View  Result Date: 07/28/2017 CLINICAL DATA:  Chest pain and shortness of breath for 1 day. History of COPD and pneumonia. EXAM: CHEST - 2 VIEW COMPARISON:  Chest radiograph August 07, 2017 FINDINGS: Cardiomediastinal silhouette is normal. No pleural effusions or focal consolidations. Bronchitic changes. Trachea projects midline and there is  no pneumothorax. Soft tissue planes and included osseous structures are non-suspicious. Large body habitus. IMPRESSION: Bronchitic changes without focal consolidation. Electronically Signed   By: Elon Alas M.D.   On: 07/28/2017 22:22        Scheduled Meds: . apixaban  5 mg Oral BID  . budesonide  0.25 mg Nebulization BID  . busPIRone  7.5 mg Oral BID  . diltiazem  360 mg Oral Daily  . famotidine  20 mg Oral BID  . fluticasone  2 spray Each Nare Daily  . furosemide  40 mg Oral Daily  . guaiFENesin  600 mg Oral BID  . insulin aspart  0-15 Units Subcutaneous TID WC  . insulin aspart  5 Units Subcutaneous TID WC  . insulin glargine  20 Units Subcutaneous Daily  . ipratropium-albuterol  3 mL Nebulization Q6H  . loratadine  10 mg Oral Daily  . methylPREDNISolone (SOLU-MEDROL) injection  80 mg Intravenous Q8H  . metoprolol tartrate  100 mg Oral BID  . montelukast  10 mg Oral QHS  . pantoprazole  40 mg Oral Daily  . sertraline  100 mg Oral Daily  . spironolactone  100 mg Oral Daily  . zolpidem  10 mg  Oral QHS   Continuous Infusions:   LOS: 1 day        Aline August, MD [facility] Pager [phone]  If 7PM-7AM, please contact night-coverage [email or web address] Password [email or web address] 07/30/2017, 11:40 AM

## 2017-07-31 LAB — GLUCOSE, CAPILLARY
GLUCOSE-CAPILLARY: 313 mg/dL — AB (ref 65–99)
GLUCOSE-CAPILLARY: 237 mg/dL — AB (ref 65–99)
GLUCOSE-CAPILLARY: 259 mg/dL — AB (ref 65–99)
Glucose-Capillary: 229 mg/dL — ABNORMAL HIGH (ref 65–99)

## 2017-07-31 MED ORDER — NICOTINE 7 MG/24HR TD PT24
7.0000 mg | MEDICATED_PATCH | Freq: Every day | TRANSDERMAL | Status: DC
  Administered 2017-07-31 – 2017-08-05 (×6): 7 mg via TRANSDERMAL
  Filled 2017-07-31 (×7): qty 1

## 2017-07-31 MED ORDER — ZOLPIDEM TARTRATE 5 MG PO TABS
10.0000 mg | ORAL_TABLET | Freq: Once | ORAL | Status: AC
  Administered 2017-07-31: 10 mg via ORAL
  Filled 2017-07-31: qty 2

## 2017-07-31 MED ORDER — DIPHENHYDRAMINE HCL 25 MG PO CAPS: 25.0000 mg | ORAL_CAPSULE | Freq: Every evening | ORAL | Status: DC | PRN

## 2017-07-31 MED ORDER — INSULIN ASPART 100 UNIT/ML ~~LOC~~ SOLN
7.0000 [IU] | Freq: Three times a day (TID) | SUBCUTANEOUS | Status: DC
  Administered 2017-07-31 – 2017-08-03 (×7): 7 [IU] via SUBCUTANEOUS

## 2017-07-31 MED ORDER — METHYLPREDNISOLONE SODIUM SUCC 40 MG IJ SOLR
40.0000 mg | Freq: Three times a day (TID) | INTRAMUSCULAR | Status: DC
  Administered 2017-07-31 – 2017-08-02 (×6): 40 mg via INTRAVENOUS
  Filled 2017-07-31 (×6): qty 1

## 2017-07-31 MED ORDER — INSULIN GLARGINE 100 UNIT/ML ~~LOC~~ SOLN
30.0000 [IU] | Freq: Every day | SUBCUTANEOUS | Status: DC
  Administered 2017-08-01: 30 [IU] via SUBCUTANEOUS
  Filled 2017-07-31 (×2): qty 0.3

## 2017-07-31 NOTE — Progress Notes (Signed)
found Ambien tabs at bedside. Patient claims she took 2 tabs last night. Paged and informed [facility] hospitalist K  Baltazar Najjar.Ambien med counted with another RN and sent to pharmacy.continued to monitor patient

## 2017-07-31 NOTE — Progress Notes (Signed)
Pt disoriented time and situation oriented to self and place. took off her gown and threw bed linens on the floor.asking for persons not present in the room. Reoriented to time and situation. Spoke to daughter who called and updated with patients condition. Patient refusing CPAP. Continued to observe and pt closely. 02 Sat 100 %  3L

## 2017-07-31 NOTE — Progress Notes (Signed)
Patient ID: April [name], female   DOB: 06/21/1970, 47 y.o.   MRN: [id number]  PROGRESS NOTE    April [name]  [id number] DOB: 12-18-1970 DOA: 07/29/2017 PCP: [name], MD   Brief Narrative:  47 year old female with history of asthma, diabetes mellitus type 2 recently diagnosed secondary to steroids, hypertension with hyperaldosteronism on spironolactone, sleep apnea, morbid obesity, recent discharge 2 weeks ago after being admitted for respiratory failure secondary to asthma and was diagnosed with atrial fibrillation as well presented with worsening shortness of breath.  She was admitted with asthma exacerbation and started on intravenous steroids.   Assessment & Plan:   Principal Problem:   Acute respiratory failure with hypoxia (HCC) Active Problems:   Essential hypertension   OSA on CPAP   Malignant hypertension due to primary aldosteronism (HCC)   Moderate persistent asthma with exacerbation   Chronic diastolic CHF (congestive heart failure) (HCC)   Unspecified atrial fibrillation (HCC)   Acute respiratory failure with hypoxia from asthma exacerbation -Improving.  Currently requiring 3L oxygen at night.  Wean off as able -On room air currently.  Asthma exacerbation -Wheezing has much improved.  Decrease Solu-Medrol from 80 mg IV every 8 hours to 40 mill grams IV every 8 hours and hope to change to oral prednisone soon..  Continue Pulmicort, loratadine, montelukast and duonebs -Outpatient follow-up with pulmonary  Hypertension -Blood pressure still intermittently on the higher side.  Continue Cardizem, Lasix, losartan, spironolactone and metoprolol  Paroxysmal atrial fibrillation -Status post cardioversion in February 2019 -Currently rate controlled -Continue Eliquis, Cardizem and metoprolol  Chronic diastolic CHF -Strict input and output.  Daily weights.  Fluid restriction. -Echo on 10/03/2016: EF of 55-60% with normal RV size and systolic function -Continue Lasix,  metoprolol, spironolactone.  Outpatient follow-up with cardiology  Diabetes mellitus type II -Hemoglobin A1c 3 weeks ago was 7.2 -Blood sugars are on the higher side probably secondary to doses of steroids as well.  Increase Lantus to 30 units daily.  Change NovoLog to 7 units 3 times daily with meals.  Continue sliding scale coverage.  Obstructive sleep apnea on CPAP  Morbid obesity -Outpatient follow-up  Leukocytosis -Probably reactive from steroid use.  Monitor  Tobacco abuse -Patient requesting nicotine patch.    DVT prophylaxis: Eliquis Code Status: Full Family Communication: None at bedside  disposition Plan: Home in 1-2 days  Consultants: None  Procedures: None  Antimicrobials: None   Subjective: Patient seen and examined at bedside.  She feels slightly better.  Her breathing is improving.  No overnight fever, nausea or vomiting. Objective: Vitals:   07/31/17 0155 07/31/17 0250 07/31/17 0916 07/31/17 1104  BP: 135/90   (!) 169/93  Pulse: 80   76  Resp: 20     Temp:      TempSrc:      SpO2: 100%  98%   Weight:  (!) 159.5 kg (351 lb 10.1 oz)    Height:        Intake/Output Summary (Last 24 hours) at 07/31/2017 1112 Last data filed at 07/31/2017 0600 Gross per 24 hour  Intake 960 ml  Output -  Net 960 ml   Filed Weights   07/29/17 1420 07/30/17 0504 07/31/17 0250  Weight: (!) 160.6 kg (354 lb 1.6 oz) (!) 160.7 kg (354 lb 3.2 oz) (!) 159.5 kg (351 lb 10.1 oz)    Examination:  General exam: No distress. Respiratory system: Bilateral decreased breath sounds at bases with improving wheezing cardiovascular system: S1-S2 heard, rate  controlled gastrointestinal system: Abdomen is morbidly obese, nondistended, soft and nontender. Normal bowel sounds heard. Central nervous system: Sleepy, wakes up and answers questions.  Moving extremities  extremities: No cyanosis; trace edema skin: No rashes, lesions or ulcers Psychiatry: Judgement and insight appear normal.  Mood & affect appropriate.     Data Reviewed: I have personally reviewed following labs and imaging studies  CBC: Recent Labs  Lab 07/28/17 2147 07/30/17 0437  WBC 20.7* 17.4*  HGB 12.2 11.6*  HCT 39.6 37.3  MCV 87.4 86.7  PLT 334 409   Basic Metabolic Panel: Recent Labs  Lab 07/28/17 2147 07/30/17 0437  NA 138 137  K 3.3* 3.8  CL 104 99*  CO2 22 25  GLUCOSE 304* 239*  BUN 13 9  CREATININE 0.86 0.67  CALCIUM 8.0* 8.4*  MG  --  2.1   GFR: Estimated Creatinine Clearance: 140.8 mL/min (by C-G formula based on SCr of 0.67 mg/dL). Liver Function Tests: No results for input(s): AST, ALT, ALKPHOS, BILITOT, PROT, ALBUMIN in the last 168 hours. No results for input(s): LIPASE, AMYLASE in the last 168 hours. No results for input(s): AMMONIA in the last 168 hours. Coagulation Profile: No results for input(s): INR, PROTIME in the last 168 hours. Cardiac Enzymes: Recent Labs  Lab 07/29/17 0729  TROPONINI <0.03   BNP (last 3 results) No results for input(s): PROBNP in the last 8760 hours. HbA1C: No results for input(s): HGBA1C in the last 72 hours. CBG: Recent Labs  Lab 07/30/17 1123 07/30/17 1700 07/30/17 2132 07/30/17 2356 07/31/17 0853  GLUCAP 243* 243* 493* 177* 229*   Lipid Profile: No results for input(s): CHOL, HDL, LDLCALC, TRIG, CHOLHDL, LDLDIRECT in the last 72 hours. Thyroid Function Tests: No results for input(s): TSH, T4TOTAL, FREET4, T3FREE, THYROIDAB in the last 72 hours. Anemia Panel: No results for input(s): VITAMINB12, FOLATE, FERRITIN, TIBC, IRON, RETICCTPCT in the last 72 hours. Sepsis Labs: No results for input(s): PROCALCITON, LATICACIDVEN in the last 168 hours.  No results found for this or any previous visit (from the past 240 hour(s)).       Radiology Studies: No results found.      Scheduled Meds: . apixaban  5 mg Oral BID  . busPIRone  7.5 mg Oral BID  . diltiazem  360 mg Oral Daily  . famotidine  20 mg Oral BID  .  fluticasone  2 spray Each Nare Daily  . furosemide  40 mg Oral Daily  . guaiFENesin  600 mg Oral BID  . insulin aspart  0-15 Units Subcutaneous TID WC  . insulin aspart  5 Units Subcutaneous TID WC  . insulin glargine  25 Units Subcutaneous Daily  . ipratropium-albuterol  3 mL Nebulization Q6H  . loratadine  10 mg Oral Daily  . losartan  100 mg Oral Daily  . methylPREDNISolone (SOLU-MEDROL) injection  80 mg Intravenous Q8H  . metoprolol tartrate  100 mg Oral BID  . mometasone-formoterol  2 puff Inhalation BID  . montelukast  10 mg Oral QHS  . pantoprazole  40 mg Oral Daily  . sertraline  100 mg Oral Daily  . spironolactone  100 mg Oral Daily  . zolpidem  10 mg Oral QHS   Continuous Infusions:   LOS: 2 days        Aline August, MD [facility] Pager [phone]  If 7PM-7AM, please contact night-coverage [email or web address] Password [email or web address] 07/31/2017, 11:12 AM

## 2017-07-31 NOTE — Progress Notes (Signed)
Pt refusing to stay in bed/chair, refusing bed/chair alarm, refusing oxygen. Security called, but pt agreeable to stay within unit at this time. Will continue to monitor.

## 2017-07-31 NOTE — Progress Notes (Signed)
Pt very non compliant to put on her 02 . Keeps on ambulating refusing to be helped.observed closely.

## 2017-07-31 NOTE — Care Management Note (Signed)
Case Management Note  Patient Details  Name: April [name] MRN: [id number] Date of Birth: 31-May-1970  Subjective/Objective:  Acute Resp Failure/ Asthma                 Action/Plan: Patient lives at home; goes to the [facility] for medical care / Transitional Care Unit followed by Berle Mull SW; CM will continue to follow for progression of care;  Expected Discharge Date:    possibly 08/01/2017              Expected Discharge Plan:  Home/Self Care  Discharge planning Services  CM Consult  Status of Service:  In process, will continue to follow  Royston Bake Orr ,[place] [phone] 07/31/2017, 11:02 AM

## 2017-07-31 NOTE — Progress Notes (Signed)
Came to room to give scheduled treatment. Pt is preparing to take a shower. Patient ask to return later to give neb treatment.

## 2017-07-31 NOTE — Hospital Discharge Follow-Up (Signed)
The patient is known to the [facility] transitional [facility].  An appointment was scheduled for 08/11/17 @ 1400 and the information was placed on the AVS. The patient has PCS assistance at home 80 hours/month.  She has applied for disability and a decision regarding her application is still pending

## 2017-08-01 DIAGNOSIS — J45901 Unspecified asthma with (acute) exacerbation: Secondary | ICD-10-CM

## 2017-08-01 LAB — MAGNESIUM: MAGNESIUM: 2.2 mg/dL (ref 1.7–2.4)

## 2017-08-01 LAB — CBC WITH DIFFERENTIAL/PLATELET
BASOS PCT: 0 %
Basophils Absolute: 0 10*3/uL (ref 0.0–0.1)
Eosinophils Absolute: 0 10*3/uL (ref 0.0–0.7)
Eosinophils Relative: 0 %
HEMATOCRIT: 37.4 % (ref 36.0–46.0)
HEMOGLOBIN: 11.7 g/dL — AB (ref 12.0–15.0)
LYMPHS ABS: 1 10*3/uL (ref 0.7–4.0)
Lymphocytes Relative: 6 %
MCH: 27.1 pg (ref 26.0–34.0)
MCHC: 31.3 g/dL (ref 30.0–36.0)
MCV: 86.8 fL (ref 78.0–100.0)
MONOS PCT: 6 %
Monocytes Absolute: 1.1 10*3/uL — ABNORMAL HIGH (ref 0.1–1.0)
NEUTROS ABS: 16.2 10*3/uL — AB (ref 1.7–7.7)
NEUTROS PCT: 88 %
Platelets: 417 10*3/uL — ABNORMAL HIGH (ref 150–400)
RBC: 4.31 MIL/uL (ref 3.87–5.11)
RDW: 17.2 % — ABNORMAL HIGH (ref 11.5–15.5)
WBC: 18.3 10*3/uL — AB (ref 4.0–10.5)

## 2017-08-01 LAB — BASIC METABOLIC PANEL
Anion gap: 11 (ref 5–15)
BUN: 18 mg/dL (ref 6–20)
CHLORIDE: 99 mmol/L — AB (ref 101–111)
CO2: 23 mmol/L (ref 22–32)
CREATININE: 0.8 mg/dL (ref 0.44–1.00)
Calcium: 8.1 mg/dL — ABNORMAL LOW (ref 8.9–10.3)
GFR calc non Af Amer: >60 mL/min (ref >60)
Glucose, Bld: 329 mg/dL — ABNORMAL HIGH (ref 65–99)
POTASSIUM: 3.9 mmol/L (ref 3.5–5.1)
Sodium: 133 mmol/L — ABNORMAL LOW (ref 135–145)

## 2017-08-01 LAB — GLUCOSE, CAPILLARY
GLUCOSE-CAPILLARY: 240 mg/dL — AB (ref 65–99)
GLUCOSE-CAPILLARY: 299 mg/dL — AB (ref 65–99)
GLUCOSE-CAPILLARY: 282 mg/dL — AB (ref 65–99)
GLUCOSE-CAPILLARY: 213 mg/dL — AB (ref 65–99)

## 2017-08-01 MED ORDER — ZOLPIDEM TARTRATE 5 MG PO TABS
10.0000 mg | ORAL_TABLET | Freq: Once | ORAL | Status: AC
  Administered 2017-08-01: 10 mg via ORAL
  Filled 2017-08-01: qty 2

## 2017-08-01 MED ORDER — INSULIN GLARGINE 100 UNIT/ML ~~LOC~~ SOLN
5.0000 [IU] | Freq: Once | SUBCUTANEOUS | Status: AC
  Administered 2017-08-01: 5 [IU] via SUBCUTANEOUS
  Filled 2017-08-01: qty 0.05

## 2017-08-01 NOTE — Progress Notes (Signed)
Patient ID: April [name], female   DOB: 03/24/1971, 47 y.o.   MRN: [id number]  PROGRESS NOTE    April [name]  [id number]:[id number] DOB: 1970-07-18 DOA: 07/29/2017 PCP: [name], MD   Brief Narrative:  47 year old female with history of asthma, diabetes mellitus type 2 recently diagnosed secondary to steroids, hypertension with hyperaldosteronism on spironolactone, sleep apnea, morbid obesity, recent discharge 2 weeks ago after being admitted for respiratory failure secondary to asthma and was diagnosed with atrial fibrillation as well presented with worsening shortness of breath.  She was admitted with asthma exacerbation and started on intravenous steroids.   Assessment & Plan:   Principal Problem:   Acute respiratory failure with hypoxia (HCC) Active Problems:   Essential hypertension   OSA on CPAP   Malignant hypertension due to primary aldosteronism (HCC)   Moderate persistent asthma with exacerbation   Chronic diastolic CHF (congestive heart failure) (HCC)   Unspecified atrial fibrillation (HCC)   Acute respiratory failure with hypoxia from asthma exacerbation -Slowly improving.  Remains on 4-5 L/min nasal cannula, wean as tolerated.  Asthma exacerbation -Wheezing has much improved.  Decrease Solu-Medrol from 80 mg IV every 8 hours to 40 mill grams IV every 8 hours and hope to change to oral prednisone soon.  Continue Pulmicort, loratadine, montelukast and duonebs -Outpatient follow-up with pulmonary/Dr. Annamaria Boots -Still with some clinical bronchospasm and hypoxia hence continue current dose of IV Solu-Medrol for additional 24 hours and reassess.  Essential hypertension -Blood pressure still intermittently on the higher side.  Continue Cardizem, Lasix, losartan, spironolactone and metoprolol  Paroxysmal atrial fibrillation -Status post cardioversion in February 2019 -Currently rate controlled -Continue Eliquis, Cardizem and metoprolol  Chronic diastolic CHF -Strict input and  output.  Daily weights.  Fluid restriction. -Echo on 10/03/2016: EF of 55-60% with normal RV size and systolic function -Continue Lasix, metoprolol, spironolactone.  Outpatient follow-up with cardiology.  Clinically compensated.  Diabetes mellitus type II -Hemoglobin A1c 3 weeks ago was 7.2 -Blood sugars are on the higher side probably secondary to doses of steroids as well.  Increase Lantus to 30 units daily.  Change NovoLog to 7 units 3 times daily with meals.  Continue sliding scale coverage. -CBGs in the 200s.  Give additional 5 units of Lantus now and consider adjustment again in a.m.  Obstructive sleep apnea on CPAP  Morbid obesity -Outpatient follow-up  Leukocytosis -Probably reactive from steroid use.  Monitor  Tobacco abuse -Patient requesting nicotine patch.    DVT prophylaxis: Eliquis Code Status: Full Family Communication: None at bedside  disposition Plan: Home in 1-2 days  Consultants: None  Procedures: None  Antimicrobials: None   Subjective: States that her breathing is about 30% better.  Still with some wheezing and intermittent dry cough.  No chest pain.  Objective: Vitals:   08/01/17 0903 08/01/17 1347 08/01/17 1356 08/01/17 1558  BP:  (!) 144/77  (!) 156/91  Pulse:  (!) 59  61  Resp:  20  18  Temp:  98 F (36.7 C)  98.7 F (37.1 C)  TempSrc:  Oral  Oral  SpO2: 100% 100% 100% 100%  Weight:      Height:        Intake/Output Summary (Last 24 hours) at 08/01/2017 1815 Last data filed at 08/01/2017 1300 Gross per 24 hour  Intake 960 ml  Output 300 ml  Net 660 ml   Filed Weights   07/29/17 1420 07/30/17 0504 07/31/17 0250  Weight: (!) 160.6 kg (354 lb 1.6  oz) (!) 160.7 kg (354 lb 3.2 oz) (!) 159.5 kg (351 lb 10.1 oz)    Examination:  General exam: Pleasant young female, moderately built and morbidly obese lying comfortably propped up in bed. Respiratory system: Slightly diminished breath sounds bilaterally with scattered few expiratory  rhonchi.  No increased work of breathing. Cardiovascular system: S1-S2 heard, rate controlled.  No JVD, murmurs or pedal edema. gastrointestinal system: Abdomen is morbidly obese, nondistended, soft and nontender. Normal bowel sounds heard.  Stable. Central nervous system: Alert and oriented x3.  No focal neurological deficits.   extremities: Moves all extremities symmetrically. skin: No rashes, lesions or ulcers Psychiatry: Judgement and insight appear normal. Mood & affect appropriate.     Data Reviewed: I have personally reviewed following labs and imaging studies  CBC: Recent Labs  Lab 07/28/17 2147 07/30/17 0437 08/01/17 0540  WBC 20.7* 17.4* 18.3*  NEUTROABS  --   --  16.2*  HGB 12.2 11.6* 11.7*  HCT 39.6 37.3 37.4  MCV 87.4 86.7 86.8  PLT 334 356 213*   Basic Metabolic Panel: Recent Labs  Lab 07/28/17 2147 07/30/17 0437 08/01/17 0540  NA 138 137 133*  K 3.3* 3.8 3.9  CL 104 99* 99*  CO2 22 25 23   GLUCOSE 304* 239* 329*  BUN 13 9 18   CREATININE 0.86 0.67 0.80  CALCIUM 8.0* 8.4* 8.1*  MG  --  2.1 2.2   Cardiac Enzymes: Recent Labs  Lab 07/29/17 0729  TROPONINI <0.03   CBG: Recent Labs  Lab 07/31/17 1652 07/31/17 2120 08/01/17 0813 08/01/17 1233 08/01/17 1641  GLUCAP 237* 259* 240* 299* 282*    No results found for this or any previous visit (from the past 240 hour(s)).       Radiology Studies: No results found.      Scheduled Meds: . apixaban  5 mg Oral BID  . busPIRone  7.5 mg Oral BID  . diltiazem  360 mg Oral Daily  . famotidine  20 mg Oral BID  . fluticasone  2 spray Each Nare Daily  . furosemide  40 mg Oral Daily  . guaiFENesin  600 mg Oral BID  . insulin aspart  0-15 Units Subcutaneous TID WC  . insulin aspart  7 Units Subcutaneous TID WC  . insulin glargine  30 Units Subcutaneous Daily  . ipratropium-albuterol  3 mL Nebulization Q6H  . loratadine  10 mg Oral Daily  . losartan  100 mg Oral Daily  . methylPREDNISolone  (SOLU-MEDROL) injection  40 mg Intravenous Q8H  . metoprolol tartrate  100 mg Oral BID  . mometasone-formoterol  2 puff Inhalation BID  . montelukast  10 mg Oral QHS  . nicotine  7 mg Transdermal Daily  . pantoprazole  40 mg Oral Daily  . sertraline  100 mg Oral Daily  . spironolactone  100 mg Oral Daily   Continuous Infusions:   LOS: 3 days      Vernell Leep, MD, FACP, [facility] Pager [phone]  If 7PM-7AM, please contact night-coverage [email or web address] Password [facility] 08/01/2017, 6:22 PM

## 2017-08-01 NOTE — Progress Notes (Signed)
Paged MD Algis Liming, MD [facility] aware pt is ambulating off the unit without RN supervision after several requests to be with staff at all times. MD stated pt is not allowed to ambulate off the unit. MD requested to inform pt when she arrives to stay in room. If pt is unwilling to stay in room, MD stated can educate pt on AMA.  Primary RN aware.

## 2017-08-01 NOTE — Progress Notes (Signed)
Pt educated by charge nurse not to ambulate off the unit without RN supervision. Pt verbalizes understanding. Secretary informed RN that pt ambulated off the unit with family member. Called security to inform. Security aware.

## 2017-08-02 LAB — GLUCOSE, CAPILLARY
GLUCOSE-CAPILLARY: 224 mg/dL — AB (ref 65–99)
GLUCOSE-CAPILLARY: 268 mg/dL — AB (ref 65–99)
Glucose-Capillary: 330 mg/dL — ABNORMAL HIGH (ref 65–99)
Glucose-Capillary: 315 mg/dL — ABNORMAL HIGH (ref 65–99)

## 2017-08-02 MED ORDER — INSULIN ASPART 100 UNIT/ML ~~LOC~~ SOLN
0.0000 [IU] | Freq: Every day | SUBCUTANEOUS | Status: DC
  Administered 2017-08-02: 3 [IU] via SUBCUTANEOUS
  Administered 2017-08-03: 4 [IU] via SUBCUTANEOUS
  Administered 2017-08-05: 2 [IU] via SUBCUTANEOUS

## 2017-08-02 MED ORDER — TRAMADOL HCL 50 MG PO TABS
50.0000 mg | ORAL_TABLET | Freq: Four times a day (QID) | ORAL | Status: DC | PRN
  Administered 2017-08-02 – 2017-08-05 (×8): 50 mg via ORAL
  Filled 2017-08-02 (×8): qty 1

## 2017-08-02 MED ORDER — METHYLPREDNISOLONE SODIUM SUCC 125 MG IJ SOLR
60.0000 mg | Freq: Three times a day (TID) | INTRAMUSCULAR | Status: DC
  Administered 2017-08-02 – 2017-08-04 (×7): 60 mg via INTRAVENOUS
  Filled 2017-08-02 (×7): qty 2

## 2017-08-02 MED ORDER — ZOLPIDEM TARTRATE 5 MG PO TABS
10.0000 mg | ORAL_TABLET | Freq: Once | ORAL | Status: AC
  Administered 2017-08-02: 10 mg via ORAL
  Filled 2017-08-02: qty 2

## 2017-08-02 MED ORDER — INSULIN ASPART 100 UNIT/ML ~~LOC~~ SOLN
0.0000 [IU] | Freq: Three times a day (TID) | SUBCUTANEOUS | Status: DC
Start: 2017-08-02 — End: 2017-08-06
  Administered 2017-08-02: 5 [IU] via SUBCUTANEOUS
  Administered 2017-08-03 (×3): 11 [IU] via SUBCUTANEOUS
  Administered 2017-08-04: 8 [IU] via SUBCUTANEOUS
  Administered 2017-08-04: 11 [IU] via SUBCUTANEOUS
  Administered 2017-08-04: 15 [IU] via SUBCUTANEOUS
  Administered 2017-08-05 (×2): 3 [IU] via SUBCUTANEOUS
  Administered 2017-08-05: 8 [IU] via SUBCUTANEOUS
  Administered 2017-08-06: 3 [IU] via SUBCUTANEOUS

## 2017-08-02 MED ORDER — INSULIN GLARGINE 100 UNIT/ML ~~LOC~~ SOLN
38.0000 [IU] | Freq: Every day | SUBCUTANEOUS | Status: DC
  Administered 2017-08-02: 38 [IU] via SUBCUTANEOUS
  Filled 2017-08-02 (×2): qty 0.38

## 2017-08-02 NOTE — Progress Notes (Signed)
Pt has declined the CPAP for tonight.  

## 2017-08-02 NOTE — Progress Notes (Signed)
Patient ID: April [name], female   DOB: Aug 30, 1970, 47 y.o.   MRN: [id number]  PROGRESS NOTE    April [name]  [id number]:[id number] DOB: Apr 17, 1971 DOA: 07/29/2017 PCP: [name], MD   Brief Narrative:  47 year old female with history of asthma, diabetes mellitus type 2 recently diagnosed secondary to steroids, hypertension with hyperaldosteronism on spironolactone, sleep apnea, morbid obesity, recent discharge 2 weeks ago after being admitted for respiratory failure secondary to asthma and was diagnosed with atrial fibrillation as well presented with worsening shortness of breath.  She was admitted with asthma exacerbation and started on intravenous steroids.  Slow to improve.   Assessment & Plan:   Principal Problem:   Acute respiratory failure with hypoxia (HCC) Active Problems:   Essential hypertension   OSA on CPAP   Malignant hypertension due to primary aldosteronism (HCC)   Moderate persistent asthma with exacerbation   Chronic diastolic CHF (congestive heart failure) (HCC)   Unspecified atrial fibrillation (HCC)   Acute respiratory failure with hypoxia from asthma exacerbation -Slowly improving.  Was still on oxygen 4 L/min this morning.  Noted 95% on room air this afternoon.  Will need to reassess home O2 requirement prior to discharge.  Asthma exacerbation -Steroids had been decreased couple days ago after clinical improvement.  However today she seems to be wheezing more.  Increased Solu-Medrol 60 mg every 8 hours. - Continue Pulmicort, loratadine, montelukast and duonebs - Outpatient follow-up with pulmonary/Dr. Annamaria Boots  Essential hypertension -Blood pressure still intermittently on the higher side.  Continue Cardizem, Lasix, losartan, spironolactone and metoprolol.  No change in treatment.  Paroxysmal atrial fibrillation -Status post cardioversion in February 2019 -Currently rate controlled -Continue Eliquis, Cardizem and metoprolol  Chronic diastolic CHF -Strict input  and output.  Daily weights.  Fluid restriction. -Echo on 10/03/2016: EF of 55-60% with normal RV size and systolic function -Continue Lasix, metoprolol, spironolactone.  Outpatient follow-up with cardiology.  Clinically compensated.  Diabetes mellitus type II -Hemoglobin A1c 3 weeks ago was 7.2 -Blood sugars are on the higher side probably secondary to doses of steroids as well.  Increased Lantus to 38 units daily.  Continue NovoLog to 7 units 3 times daily with meals.  Continue sliding scale coverage. -Increased Lantus due to increasing steroid dose today.  Obstructive sleep apnea on CPAP  Morbid obesity -Outpatient follow-up  Leukocytosis -Probably reactive from steroid use.  Monitor  Tobacco abuse -Patient requesting nicotine patch.  Left lower extremity pain, chronic Reports ongoing pain for several months.  Probably arthritic.  Lower extremity venous Doppler 06/06/17 without DVT.  Low index of suspicion for DVT.  Already on Eliquis.  Reports tramadol has helped in the past, started as needed.    DVT prophylaxis: Eliquis Code Status: Full Family Communication: None at bedside  disposition Plan: Home pending clinical improvement.  Consultants: None  Procedures: None  Antimicrobials: None   Subjective: Reports no significant change in her breathing since yesterday.  Ongoing wheezing.  No chest pain or cough.  Chronic left lower extremity pain for several months.  No swelling.  Objective: Vitals:   08/02/17 0848 08/02/17 0931 08/02/17 1229 08/02/17 1426  BP: (!) 183/95  (!) 158/76   Pulse: 64  65   Resp:   20   Temp:      TempSrc:      SpO2:  100% 100% 95%  Weight:      Height:        Intake/Output Summary (Last 24 hours) at 08/02/2017 1557  Last data filed at 08/02/2017 1541 Gross per 24 hour  Intake 1300 ml  Output 320 ml  Net 980 ml   Filed Weights   07/30/17 0504 07/31/17 0250 08/02/17 1655  Weight: (!) 160.7 kg (354 lb 3.2 oz) (!) 159.5 kg (351 lb 10.1 oz)  (!) 157.5 kg (347 lb 3.2 oz)    Examination:  General exam: Pleasant young female, moderately built and morbidly obese lying comfortably propped up in bed. Respiratory system: Reduced breath sounds bilaterally with few bilateral medium pitched expiratory rhonchi and wheezing without crackles.  No increased work of breathing. Cardiovascular system: S1-S2 heard, rate controlled.  No JVD, murmurs or pedal edema. gastrointestinal system: Abdomen is morbidly obese, nondistended, soft and nontender. Normal bowel sounds heard.  Stable. Central nervous system: Alert and oriented x3.  No focal neurological deficits.   extremities: Moves all extremities symmetrically. skin: No rashes, lesions or ulcers Psychiatry: Judgement and insight appear normal. Mood & affect appropriate.     Data Reviewed: I have personally reviewed following labs and imaging studies  CBC: Recent Labs  Lab 07/28/17 2147 07/30/17 0437 08/01/17 0540  WBC 20.7* 17.4* 18.3*  NEUTROABS  --   --  16.2*  HGB 12.2 11.6* 11.7*  HCT 39.6 37.3 37.4  MCV 87.4 86.7 86.8  PLT 334 356 374*   Basic Metabolic Panel: Recent Labs  Lab 07/28/17 2147 07/30/17 0437 08/01/17 0540  NA 138 137 133*  K 3.3* 3.8 3.9  CL 104 99* 99*  CO2 22 25 23   GLUCOSE 304* 239* 329*  BUN 13 9 18   CREATININE 0.86 0.67 0.80  CALCIUM 8.0* 8.4* 8.1*  MG  --  2.1 2.2   Cardiac Enzymes: Recent Labs  Lab 07/29/17 0729  TROPONINI <0.03   CBG: Recent Labs  Lab 08/01/17 1233 08/01/17 1641 08/01/17 2103 08/02/17 0804 08/02/17 1122  GLUCAP 299* 282* 213* 330* 315*    No results found for this or any previous visit (from the past 240 hour(s)).       Radiology Studies: No results found.      Scheduled Meds: . apixaban  5 mg Oral BID  . busPIRone  7.5 mg Oral BID  . diltiazem  360 mg Oral Daily  . famotidine  20 mg Oral BID  . fluticasone  2 spray Each Nare Daily  . furosemide  40 mg Oral Daily  . guaiFENesin  600 mg Oral BID   . insulin aspart  0-15 Units Subcutaneous TID WC  . insulin aspart  7 Units Subcutaneous TID WC  . insulin glargine  38 Units Subcutaneous Daily  . ipratropium-albuterol  3 mL Nebulization Q6H  . loratadine  10 mg Oral Daily  . losartan  100 mg Oral Daily  . methylPREDNISolone (SOLU-MEDROL) injection  60 mg Intravenous Q8H  . metoprolol tartrate  100 mg Oral BID  . mometasone-formoterol  2 puff Inhalation BID  . montelukast  10 mg Oral QHS  . nicotine  7 mg Transdermal Daily  . pantoprazole  40 mg Oral Daily  . sertraline  100 mg Oral Daily  . spironolactone  100 mg Oral Daily   Continuous Infusions:   LOS: 4 days      Vernell Leep, MD, FACP, [facility]. [facility] Pager [phone]  If 7PM-7AM, please contact night-coverage [email or web address] Password [facility] 08/02/2017, 3:57 PM

## 2017-08-03 LAB — GLUCOSE, CAPILLARY
GLUCOSE-CAPILLARY: 304 mg/dL — AB (ref 65–99)
Glucose-Capillary: 328 mg/dL — ABNORMAL HIGH (ref 65–99)
Glucose-Capillary: 311 mg/dL — ABNORMAL HIGH (ref 65–99)
Glucose-Capillary: 339 mg/dL — ABNORMAL HIGH (ref 65–99)

## 2017-08-03 MED ORDER — INSULIN GLARGINE 100 UNIT/ML ~~LOC~~ SOLN
43.0000 [IU] | Freq: Every day | SUBCUTANEOUS | Status: DC
  Administered 2017-08-03: 43 [IU] via SUBCUTANEOUS
  Filled 2017-08-03 (×2): qty 0.43

## 2017-08-03 MED ORDER — ZOLPIDEM TARTRATE 5 MG PO TABS
5.0000 mg | ORAL_TABLET | Freq: Every evening | ORAL | Status: DC | PRN
  Administered 2017-08-03 – 2017-08-05 (×3): 5 mg via ORAL
  Filled 2017-08-03 (×3): qty 1

## 2017-08-03 MED ORDER — INSULIN ASPART 100 UNIT/ML ~~LOC~~ SOLN
9.0000 [IU] | Freq: Three times a day (TID) | SUBCUTANEOUS | Status: DC
  Administered 2017-08-03 (×2): 9 [IU] via SUBCUTANEOUS

## 2017-08-03 NOTE — Progress Notes (Signed)
Pt slept on and off overnight, Ambien given after requesting with MD per patient request, denies SOB and CP, vitals stable, will continue to monitor the patient  Palma Holter, RN

## 2017-08-03 NOTE — Progress Notes (Signed)
Patient ID: April [name], female   DOB: 06-09-1970, 47 y.o.   MRN: [id number]  PROGRESS NOTE    April [name]  CZY:[id number] DOB: Mar 08, 1971 DOA: 07/29/2017 PCP: [name], MD   Brief Narrative:  47 year old female with history of asthma, diabetes mellitus type 2 recently diagnosed secondary to steroids, hypertension with hyperaldosteronism on spironolactone, sleep apnea, morbid obesity, recent discharge 2 weeks ago after being admitted for respiratory failure secondary to asthma and was diagnosed with atrial fibrillation as well presented with worsening shortness of breath.  She was admitted with asthma exacerbation and started on intravenous steroids.  Slow to improve.   Assessment & Plan:   Principal Problem:   Acute respiratory failure with hypoxia (HCC) Active Problems:   Essential hypertension   OSA on CPAP   Malignant hypertension due to primary aldosteronism (HCC)   Moderate persistent asthma with exacerbation   Chronic diastolic CHF (congestive heart failure) (HCC)   Unspecified atrial fibrillation (HCC)   Acute respiratory failure with hypoxia from asthma exacerbation -Hypoxia seems to have resolved.  Will need to reassess home O2 requirement prior to discharge.  Asthma exacerbation -Steroids had been decreased couple days ago after clinical improvement.  However 4/6 she seemed to be wheezing more & increased Solu-Medrol 60 mg every 8 hours. - Continue Pulmicort, loratadine, montelukast and duonebs - Outpatient follow-up with pulmonary/Dr. Annamaria Boots -Somewhat better today.  Continue current regimen.  If does not make consistent improvement then consider pulmonology consultation.  Essential hypertension -Blood pressure still intermittently on the higher side.  Continue Cardizem, Lasix, losartan, spironolactone and metoprolol.  No change in treatment.  Paroxysmal atrial fibrillation -Status post cardioversion in February 2019 -Currently rate controlled -Continue Eliquis,  Cardizem and metoprolol  Chronic diastolic CHF -Strict input and output.  Daily weights.  Fluid restriction. -Echo on 10/03/2016: EF of 55-60% with normal RV size and systolic function -Continue Lasix, metoprolol, spironolactone.  Outpatient follow-up with cardiology.  Clinically compensated.  Diabetes mellitus type II -Hemoglobin A1c 3 weeks ago was 7.2 -Blood sugars are on the higher side probably secondary to doses of steroids as well.  Increased Lantus to 42 units daily.  Increased NovoLog to 9 units 3 times daily with meals.  Continue sliding scale coverage. -Monitor closely and may need further adjustment.  Obstructive sleep apnea on CPAP  Morbid obesity -Outpatient follow-up  Leukocytosis -Probably reactive from steroid use.  Monitor  Tobacco abuse -Patient requesting nicotine patch.  Left lower extremity pain, chronic Reports ongoing pain for several months.  Probably arthritic.  Lower extremity venous Doppler 06/06/17 without DVT.  Low index of suspicion for DVT.  Already on Eliquis.  Reports tramadol has helped in the past, started as needed.  Better controlled.    DVT prophylaxis: Eliquis Code Status: Full Family Communication: None at bedside  disposition Plan: Home pending clinical improvement.  Consultants: None  Procedures: None  Antimicrobials: None   Subjective: States that her dyspnea is somewhat better compared to yesterday.  Left lower extremity pain also better controlled with current regimen.  Requesting sleeping aid at night.  Objective: Vitals:   08/03/17 0229 08/03/17 0715 08/03/17 0735 08/03/17 1144  BP:  (!) 170/94  (!) 151/77  Pulse:  69  65  Resp:  20  16  Temp:  98.4 F (36.9 C)  98.9 F (37.2 C)  TempSrc:  Oral  Oral  SpO2: 98% 99% 99% 100%  Weight:      Height:  Intake/Output Summary (Last 24 hours) at 08/03/2017 1622 Last data filed at 08/03/2017 1305 Gross per 24 hour  Intake 1680 ml  Output 2000 ml  Net -320 ml   Filed  Weights   07/31/17 0250 08/02/17 8299  Weight: (!) 159.5 kg (351 lb 10.1 oz) (!) 157.5 kg (347 lb 3.2 oz)    Examination:  General exam: Pleasant young female, moderately built and morbidly obese lying comfortably propped up in bed. Respiratory system: Improved breath sounds compared to yesterday.  Still slightly harsh and few bilateral expiratory rhonchi heard.  No increased work of breathing. Cardiovascular system: S1-S2 heard, rate controlled.  No JVD, murmurs or pedal edema. gastrointestinal system: Abdomen is morbidly obese, nondistended, soft and nontender. Normal bowel sounds heard.  Stable. Central nervous system: Alert and oriented x3.  No focal neurological deficits.   extremities: Moves all extremities symmetrically. skin: No rashes, lesions or ulcers Psychiatry: Judgement and insight appear normal. Mood & affect appropriate.     Data Reviewed: I have personally reviewed following labs and imaging studies  CBC: Recent Labs  Lab 07/28/17 2147 07/30/17 0437 08/01/17 0540  WBC 20.7* 17.4* 18.3*  NEUTROABS  --   --  16.2*  HGB 12.2 11.6* 11.7*  HCT 39.6 37.3 37.4  MCV 87.4 86.7 86.8  PLT 334 356 371*   Basic Metabolic Panel: Recent Labs  Lab 07/28/17 2147 07/30/17 0437 08/01/17 0540  NA 138 137 133*  K 3.3* 3.8 3.9  CL 104 99* 99*  CO2 22 25 23   GLUCOSE 304* 239* 329*  BUN 13 9 18   CREATININE 0.86 0.67 0.80  CALCIUM 8.0* 8.4* 8.1*  MG  --  2.1 2.2   Cardiac Enzymes: Recent Labs  Lab 07/29/17 0729  TROPONINI <0.03   CBG: Recent Labs  Lab 08/02/17 1122 08/02/17 1639 08/02/17 2059 08/03/17 0744 08/03/17 1141  GLUCAP 315* 224* 268* 328* 304*    No results found for this or any previous visit (from the past 240 hour(s)).       Radiology Studies: No results found.      Scheduled Meds: . apixaban  5 mg Oral BID  . busPIRone  7.5 mg Oral BID  . diltiazem  360 mg Oral Daily  . famotidine  20 mg Oral BID  . fluticasone  2 spray Each  Nare Daily  . furosemide  40 mg Oral Daily  . guaiFENesin  600 mg Oral BID  . insulin aspart  0-15 Units Subcutaneous TID WC  . insulin aspart  0-5 Units Subcutaneous QHS  . insulin aspart  9 Units Subcutaneous TID WC  . insulin glargine  43 Units Subcutaneous Daily  . ipratropium-albuterol  3 mL Nebulization Q6H  . loratadine  10 mg Oral Daily  . losartan  100 mg Oral Daily  . methylPREDNISolone (SOLU-MEDROL) injection  60 mg Intravenous Q8H  . metoprolol tartrate  100 mg Oral BID  . mometasone-formoterol  2 puff Inhalation BID  . montelukast  10 mg Oral QHS  . nicotine  7 mg Transdermal Daily  . pantoprazole  40 mg Oral Daily  . sertraline  100 mg Oral Daily  . spironolactone  100 mg Oral Daily   Continuous Infusions:   LOS: 5 days      Vernell Leep, MD, FACP, [facility]. [facility] Pager [phone]  If 7PM-7AM, please contact night-coverage [email or web address] Password [facility] 08/03/2017, 4:22 PM

## 2017-08-04 ENCOUNTER — Inpatient Hospital Stay (HOSPITAL_COMMUNITY): Payer: Medicaid Other

## 2017-08-04 DIAGNOSIS — I10 Essential (primary) hypertension: Secondary | ICD-10-CM

## 2017-08-04 DIAGNOSIS — J9601 Acute respiratory failure with hypoxia: Secondary | ICD-10-CM

## 2017-08-04 DIAGNOSIS — J4551 Severe persistent asthma with (acute) exacerbation: Secondary | ICD-10-CM

## 2017-08-04 LAB — ECHOCARDIOGRAM COMPLETE
HEIGHTINCHES: 66 in
WEIGHTICAEL: 5555.2 [oz_av]

## 2017-08-04 LAB — GLUCOSE, CAPILLARY
GLUCOSE-CAPILLARY: 288 mg/dL — AB (ref 65–99)
Glucose-Capillary: 315 mg/dL — ABNORMAL HIGH (ref 65–99)
Glucose-Capillary: 357 mg/dL — ABNORMAL HIGH (ref 65–99)
Glucose-Capillary: 326 mg/dL — ABNORMAL HIGH (ref 65–99)

## 2017-08-04 LAB — BRAIN NATRIURETIC PEPTIDE: B Natriuretic Peptide: 82.9 pg/mL (ref 0.0–100.0)

## 2017-08-04 MED ORDER — PANTOPRAZOLE SODIUM 40 MG PO TBEC
40.0000 mg | DELAYED_RELEASE_TABLET | Freq: Two times a day (BID) | ORAL | Status: DC
  Administered 2017-08-04 – 2017-08-06 (×4): 40 mg via ORAL
  Filled 2017-08-04 (×4): qty 1

## 2017-08-04 MED ORDER — INSULIN ASPART 100 UNIT/ML ~~LOC~~ SOLN
12.0000 [IU] | Freq: Three times a day (TID) | SUBCUTANEOUS | Status: DC
  Administered 2017-08-04 – 2017-08-05 (×4): 12 [IU] via SUBCUTANEOUS

## 2017-08-04 MED ORDER — PERFLUTREN LIPID MICROSPHERE
1.0000 mL | INTRAVENOUS | Status: AC | PRN
  Administered 2017-08-04: 2 mL via INTRAVENOUS

## 2017-08-04 MED ORDER — PHENOL 1.4 % MT LIQD
1.0000 | OROMUCOSAL | Status: DC | PRN
Start: 2017-08-04 — End: 2017-08-06
  Administered 2017-08-04: 1 via OROMUCOSAL
  Filled 2017-08-04: qty 177

## 2017-08-04 MED ORDER — INSULIN GLARGINE 100 UNIT/ML ~~LOC~~ SOLN
48.0000 [IU] | Freq: Every day | SUBCUTANEOUS | Status: DC
  Administered 2017-08-04 – 2017-08-05 (×2): 48 [IU] via SUBCUTANEOUS
  Filled 2017-08-04 (×3): qty 0.48

## 2017-08-04 MED ORDER — POTASSIUM CHLORIDE 10 MEQ/100ML IV SOLN
10.0000 meq | INTRAVENOUS | Status: AC
  Administered 2017-08-04: 10 meq via INTRAVENOUS
  Filled 2017-08-04 (×2): qty 100

## 2017-08-04 MED ORDER — METHYLPREDNISOLONE SODIUM SUCC 40 MG IJ SOLR
40.0000 mg | Freq: Two times a day (BID) | INTRAMUSCULAR | Status: DC
  Administered 2017-08-04: 40 mg via INTRAVENOUS
  Filled 2017-08-04 (×2): qty 1

## 2017-08-04 MED ORDER — POTASSIUM CHLORIDE CRYS ER 20 MEQ PO TBCR
40.0000 meq | EXTENDED_RELEASE_TABLET | Freq: Once | ORAL | Status: AC
  Administered 2017-08-04: 40 meq via ORAL
  Filled 2017-08-04: qty 2

## 2017-08-04 MED ORDER — PERFLUTREN LIPID MICROSPHERE
INTRAVENOUS | Status: AC
  Administered 2017-08-04: 2 mL via INTRAVENOUS
  Filled 2017-08-04: qty 10

## 2017-08-04 MED ORDER — NYSTATIN 100000 UNIT/ML MT SUSP
5.0000 mL | Freq: Four times a day (QID) | OROMUCOSAL | Status: DC
  Administered 2017-08-04 – 2017-08-06 (×8): 500000 [IU] via ORAL
  Filled 2017-08-04 (×8): qty 5

## 2017-08-04 MED ORDER — FUROSEMIDE 10 MG/ML IJ SOLN
40.0000 mg | Freq: Once | INTRAMUSCULAR | Status: AC
  Administered 2017-08-04: 40 mg via INTRAVENOUS
  Filled 2017-08-04: qty 4

## 2017-08-04 NOTE — Progress Notes (Signed)
Patient ID: April [name], female   DOB: 1970/07/26, 47 y.o.   MRN: [id number]  PROGRESS NOTE    ADALENE GULOTTA  [id number]:[id number] DOB: Feb 20, 1971 DOA: 07/29/2017 PCP: [name], MD   Brief Narrative:  47 year old female with history of chronic severe asthma-steroid dependent/prednisone 10 mg daily, diabetes mellitus type 2 recently diagnosed secondary to steroids, hypertension with hyperaldosteronism on spironolactone, sleep apnea, morbid obesity and declined for bariatric surgery, A. fib on Eliquis,, recent discharge 2 weeks ago after being admitted for respiratory failure secondary to asthma and was diagnosed with atrial fibrillation as well presented with worsening shortness of breath. She was admitted with asthma exacerbation and started on intravenous steroids.  Despite treatment, not progressing as expected and hence pulmonology was consulted on 4/8.  Hyperglycemia complicated by increasing steroids, adjusting insulins.   Assessment & Plan:   Principal Problem:   Acute respiratory failure with hypoxia (HCC) Active Problems:   Essential hypertension   OSA on CPAP   Malignant hypertension due to primary aldosteronism (HCC)   Moderate persistent asthma with exacerbation   Chronic diastolic CHF (congestive heart failure) (HCC)   Unspecified atrial fibrillation (HCC)   Acute respiratory failure with hypoxia from asthma exacerbation -Treat asthma exacerbation.  Will reassess home oxygen requirement prior to discharge.  Asthma exacerbation -Despite treatment with IV steroids, bronchodilator nebulizations and other measures, patient slow to improve.  Pulmonology thereby consulted on 4/8. -As per pulmonology, her dyspnea is likely multifactorial: Asthma exacerbation complicated by LPR from GERD, OSA and not compliant with CPAP even in the hospital, suspecting Candida esophagitis, morbid obesity and concern for acute on chronic diastolic CHF. -Pulmonology have adjusted several medications:  Continue Pepcid twice daily, changed PPI to twice daily, continue Dulera, steroids reduced/Solu-Medrol 40 mg every 12 hourly, Lasix x1 dose, empiric oral nystatin, checking 2D echo.  Essential hypertension - Continue Cardizem, Lasix, losartan, spironolactone and metoprolol.  Blood pressure is better today.  Paroxysmal atrial fibrillation -Status post cardioversion in February 2019 -Currently rate controlled -Continue Eliquis, Cardizem and metoprolol  Chronic diastolic CHF -Strict input and output.  Daily weights.  Fluid restriction. -Echo on 10/03/2016: EF of 55-60% with normal RV size and systolic function -Continue Lasix, metoprolol, spironolactone.  Outpatient follow-up with cardiology.  Clinically compensated. -Pulmonology are suspecting diastolic dysfunction and element of acute on chronic diastolic CHF and hence gave a dose of Lasix and checking repeat echo.  Diabetes mellitus type II -Hemoglobin A1c 3 weeks ago was 7.2 -Worsening hyperglycemia secondary to increasing dose of steroids. -Steroids were decreased by pulmonology on 4/8.  Please reassess insulins on 4/9 and consider reducing  Obstructive sleep apnea on CPAP -Still regarding importance of compliance with all aspects of medical care and she verbalized understanding.  Morbid obesity -Outpatient follow-up  Leukocytosis -Probably reactive from steroid use.  Monitor  Tobacco abuse -Patient requesting nicotine patch.  Left lower extremity pain, chronic Reports ongoing pain for several months.  Probably arthritic.  Lower extremity venous Doppler 06/06/17 without DVT.  Low index of suspicion for DVT.  Already on Eliquis.  Reports tramadol has helped in the past, started as needed.  Better controlled.    DVT prophylaxis: Eliquis Code Status: Full Family Communication: Discussed in detail with patient's female family/friend at bedside. disposition Plan: Home pending clinical improvement.  Not medically stable for  discharge.  Consultants: Pulmonology.  Procedures: None  Antimicrobials: None   Subjective: Reports ongoing wheezing and states that her dyspnea has not really improved since admission.  No chest pain.  No cough.  Ongoing issues with difficulty sleeping at night.  Objective: Vitals:   08/04/17 0854 08/04/17 0902 08/04/17 1146 08/04/17 1417  BP:   (!) 110/58   Pulse:   66   Resp:   18   Temp:   98.2 F (36.8 C)   TempSrc:   Oral   SpO2: 98% 98% 100% 98%  Weight:      Height:        Intake/Output Summary (Last 24 hours) at 08/04/2017 1752 Last data filed at 08/04/2017 1700 Gross per 24 hour  Intake 1180 ml  Output 2400 ml  Net -1220 ml   Filed Weights   08/02/17 7322  Weight: (!) 157.5 kg (347 lb 3.2 oz)    Examination:  General exam: Pleasant young female, moderately built and morbidly obese, easily dyspneic on exertion even moving slightly in bed. Respiratory system: Again worse this morning compared to yesterday with reduced breath sounds bilaterally with scattered bilateral audible wheezes and rhonchi.  No crackles.  Dyspnea on minimal exertion. Cardiovascular system: S1-S2 heard, rate controlled.  No JVD, murmurs or pedal edema.  Stable without change. gastrointestinal system: Abdomen is morbidly obese, nondistended, soft and nontender. Normal bowel sounds heard.  Stable. Central nervous system: Alert and oriented x3.  No focal neurological deficits.   extremities: Moves all extremities symmetrically. skin: No rashes, lesions or ulcers Psychiatry: Judgement and insight appear normal. Mood & affect appropriate.     Data Reviewed: I have personally reviewed following labs and imaging studies  CBC: Recent Labs  Lab 07/28/17 2147 07/30/17 0437 08/01/17 0540  WBC 20.7* 17.4* 18.3*  NEUTROABS  --   --  16.2*  HGB 12.2 11.6* 11.7*  HCT 39.6 37.3 37.4  MCV 87.4 86.7 86.8  PLT 334 356 025*   Basic Metabolic Panel: Recent Labs  Lab 07/28/17 2147  07/30/17 0437 08/01/17 0540  NA 138 137 133*  K 3.3* 3.8 3.9  CL 104 99* 99*  CO2 22 25 23   GLUCOSE 304* 239* 329*  BUN 13 9 18   CREATININE 0.86 0.67 0.80  CALCIUM 8.0* 8.4* 8.1*  MG  --  2.1 2.2   Cardiac Enzymes: Recent Labs  Lab 07/29/17 0729  TROPONINI <0.03   CBG: Recent Labs  Lab 08/03/17 1638 08/03/17 2106 08/04/17 0753 08/04/17 1201 08/04/17 1610  GLUCAP 311* 339* 315* 357* 288*    No results found for this or any previous visit (from the past 240 hour(s)).       Radiology Studies: No results found.      Scheduled Meds: . apixaban  5 mg Oral BID  . busPIRone  7.5 mg Oral BID  . diltiazem  360 mg Oral Daily  . famotidine  20 mg Oral BID  . fluticasone  2 spray Each Nare Daily  . furosemide  40 mg Oral Daily  . guaiFENesin  600 mg Oral BID  . insulin aspart  0-15 Units Subcutaneous TID WC  . insulin aspart  0-5 Units Subcutaneous QHS  . insulin aspart  12 Units Subcutaneous TID WC  . insulin glargine  48 Units Subcutaneous Daily  . ipratropium-albuterol  3 mL Nebulization Q6H  . loratadine  10 mg Oral Daily  . losartan  100 mg Oral Daily  . methylPREDNISolone (SOLU-MEDROL) injection  40 mg Intravenous Q12H  . metoprolol tartrate  100 mg Oral BID  . mometasone-formoterol  2 puff Inhalation BID  . montelukast  10 mg Oral QHS  .  nicotine  7 mg Transdermal Daily  . nystatin  5 mL Oral QID  . pantoprazole  40 mg Oral BID  . sertraline  100 mg Oral Daily  . spironolactone  100 mg Oral Daily   Continuous Infusions:   LOS: 6 days      Vernell Leep, MD, FACP, [facility]. [facility] Pager [phone]  If 7PM-7AM, please contact night-coverage [email or web address] Password [facility] 08/04/2017, 5:52 PM

## 2017-08-04 NOTE — Progress Notes (Signed)
Pt refusing IV K due to burning. Pt can swallow medications, NP paged.

## 2017-08-04 NOTE — Progress Notes (Signed)
Pt slept well in CPAP overnight, vitals been stable, denies CP and SOB, family member in bed side and is updating,Tramadol given this morning for her left leg pain, will continue to monitor the patient  Palma Holter, RN

## 2017-08-04 NOTE — Progress Notes (Signed)
*  PRELIMINARY RESULTS* Echocardiogram 2D Echocardiogram with DEFINITY has been performed.  Leavy Cella 08/04/2017, 4:38 PM

## 2017-08-04 NOTE — Progress Notes (Signed)
Called sercurity due patient leaving the floor. Patient back on the floor.

## 2017-08-04 NOTE — Consult Note (Signed)
Name: April [name] MRN: [id number] DOB: April 23, 1971    ADMISSION DATE:  07/29/2017 CONSULTATION DATE:  4/8   REFERRING MD :  hongalgi   CHIEF COMPLAINT:  Asthma exacerbation not improving   BRIEF PATIENT DESCRIPTION:  47 year old female with history of morbid obesity, chronic severe asthma, hypertension, atrial fibrillation, chronic diastolic dysfunction, as well as medical noncompliance.  Admitted 4/2 with a working diagnosis of asthmatic exacerbation, treated appropriately with systemic steroids, supplemental oxygen, and bronchodilators.  Pulmonary asked to see on 4/8 due to ongoing wheezing and slow return to baseline.  SIGNIFICANT EVENTS   STUDIES:     HISTORY OF PRESENT ILLNESS:    This is a 47 year old female with history of chronic severe asthma, complicated by medical noncompliance, OSA, allergic rhinitis and GERD.  Also has a history of atrial fibrillation on Eliquis, morbid obesity, hypertension, and depression.  She is followed by Dr. Annamaria Boots in our office.  She was last seen March 2019 in our office.  She is maintained on oral prednisone 10 mg daily, nebulized bronchodilators, Antihistamines, and Xolair.  She presented to the emergency room on 4/2 with chief complaint of worsening shortness of breath.  She recently discharged 2 weeks prior for asthmatic exacerbation complicated by atrial fibrillation.  She reported her symptoms began approximately 1 week prior to presentation.  She denied cough or chest pain, also denied fever or chills.  She was having increasing wheezing, no sick exposures.  Was admitted with a working diagnosis of recurrent asthmatic exacerbation and treated with scheduled bronchodilators, supplemental oxygen, and IV systemic steroids.  She was also noted to have hypertension for which her antihypertensive regimen was adjusted.  Since hospital admit her course has been complicated by: Refusing BiPAP per RT notes, leaving off the floor [place],  walkingAnd ambulating without her oxygen.  Her oxygen requirements have improved, but she continued to "wheeze" and on 4/6 steroid dosing was increased.  We were asked to see on 4/7 because of ongoing wheezing   PAST MEDICAL HISTORY :   has a past medical history of Acanthosis nigricans, Anxiety, Arthritis, Asthma, COPD (chronic obstructive pulmonary disease) ([place]), Depression, GERD (gastroesophageal reflux disease), IFOYDXAJ(287.8), Helicobacter pylori (H. pylori) infection, Hypertension, essential, Insomnia, Menorrhagia, Morbid obesity ([place]), OSA on CPAP, Pneumonia (X 1), Seasonal allergies, Shortness of breath, and Tobacco user.  has a past surgical history that includes Tubal ligation (1996); Reduction mammaplasty (Bilateral, 09/2011); and Cardioversion (N/A, 05/30/2017). Prior to Admission medications   Medication Sig Start Date End Date Taking? Authorizing Provider  ACCU-CHEK SOFTCLIX LANCETS lancets Use as instructed 07/18/17  Yes [name], MD  albuterol (PROVENTIL HFA;VENTOLIN HFA) 108 (90 Base) MCG/ACT inhaler Inhale 2 puffs into the lungs every 4 (four) hours as needed for wheezing or shortness of breath. 07/18/17  Yes Newlin, Enobong, MD  apixaban (ELIQUIS) 5 MG TABS tablet Take 1 tablet (5 mg total) by mouth 2 (two) times daily. 07/18/17  Yes [name], MD  arformoterol (BROVANA) 15 MCG/2ML NEBU Take 2 mLs (15 mcg total) by nebulization 2 (two) times daily. 06/08/17  Yes Elwin Mocha, MD  blood glucose meter kit and supplies KIT Dispense based on patient and insurance preference. Use up to four times daily as directed. (FOR ICD-9 250.00, 250.01). 07/12/17  Yes Elodia Florence., MD  Blood Glucose Monitoring Suppl (ACCU-CHEK AVIVA PLUS) w/Device KIT Test blood sugar 4 times a day. 07/18/17  Yes Newlin, Enobong, MD  budesonide (PULMICORT) 0.25 MG/2ML nebulizer solution Take 2  mLs (0.25 mg total) by nebulization 2 (two) times daily. 07/04/17  Yes [name], MD    budesonide-formoterol (SYMBICORT) 160-4.5 MCG/ACT inhaler Inhale 2 puffs into the lungs 2 (two) times daily. 07/14/17  Yes Young, Tarri Fuller D, MD  busPIRone (BUSPAR) 7.5 MG tablet Take 1 tablet (7.5 mg total) 2 (two) times daily by mouth. 03/12/17  Yes [name], MD  butalbital-acetaminophen-caffeine (FIORICET, ESGIC) 50-325-40 MG tablet Take 1-2 tablets by mouth every 6 (six) hours as needed for headache. 12/19/16 12/19/17 Yes Joy, Shawn C, PA-C  diltiazem (CARDIZEM CD) 360 MG 24 hr capsule Take 1 capsule (360 mg total) by mouth daily. 06/25/17  Yes [name], MD  famotidine (PEPCID) 20 MG tablet Take 1 tablet (20 mg total) by mouth 2 (two) times daily. 03/29/17  Yes Ward, Ozella Almond, PA-C  fluticasone (FLONASE) 50 MCG/ACT nasal spray Place 2 sprays into both nostrils daily. 12/13/16  Yes Rai, Ripudeep K, MD  furosemide (LASIX) 40 MG tablet Take 1 tablet (40 mg total) daily by mouth. 03/12/17 03/12/18 Yes Newlin, Enobong, MD  glipiZIDE (GLUCOTROL) 5 MG tablet Take 0.5 tablets (2.5 mg total) by mouth 2 (two) times daily before a meal. 07/18/17  Yes Newlin, Enobong, MD  glucose blood (ACCU-CHEK AVIVA PLUS) test strip Use as instructed 07/18/17  Yes Newlin, Enobong, MD  guaiFENesin (MUCINEX) 600 MG 12 hr tablet Take 1 tablet (600 mg total) by mouth 2 (two) times daily. 06/01/17  Yes Regalado, Belkys A, MD  loratadine (CLARITIN) 10 MG tablet Take 1 tablet (10 mg total) by mouth daily. 08/30/15  Yes [name], MD  losartan (COZAAR) 100 MG tablet Take 1 tablet (100 mg total) by mouth daily. 07/18/17  Yes [name], MD  metoprolol tartrate (LOPRESSOR) 100 MG tablet Take 1 tablet (100 mg total) by mouth 2 (two) times daily. 07/18/17  Yes Newlin, Charlane Ferretti, MD  mometasone-formoterol (DULERA) 200-5 MCG/ACT AERO Inhale 2 puffs into the lungs 2 (two) times daily. 07/09/17  Yes Young, Tarri Fuller D, MD  montelukast (SINGULAIR) 10 MG tablet Take 1 tablet (10 mg total) by mouth at bedtime. 07/18/17  Yes [name], MD  omeprazole (PRILOSEC) 20 MG capsule Take 1 capsule (20 mg total) by mouth 2 (two) times daily. 04/30/17  Yes Geradine Girt, DO  predniSONE (DELTASONE) 10 MG tablet Take 1 tablet (10 mg total) by mouth daily with breakfast. 07/14/17  Yes Young, Clinton D, MD  sertraline (ZOLOFT) 100 MG tablet Take 1 tablet (100 mg total) by mouth daily. 07/18/17  Yes [name], MD  spironolactone (ALDACTONE) 100 MG tablet Take 1 tablet (100 mg total) by mouth daily. 07/18/17  Yes [name], MD  Vitamin D, Ergocalciferol, (DRISDOL) 50000 units CAPS capsule Take 1 capsule (50,000 Units total) by mouth every 7 (seven) days. 06/07/17  Yes Regalado, Belkys A, MD  zolpidem (AMBIEN) 10 MG tablet Take 10 mg by mouth at bedtime. 09/27/10  Yes [provider]   Allergies  Allergen Reactions  . Contrast Media [Iodinated Diagnostic Agents] Itching    Ct contrast    FAMILY HISTORY:  family history includes Asthma in her daughter and maternal grandmother; Cancer in her paternal aunt; Hypertension in her mother. SOCIAL HISTORY:  reports that she quit smoking about 2 years ago. Her smoking use included cigarettes. She has a 13.00 pack-year smoking history. She has never used smokeless tobacco. She reports that she does not drink alcohol or use drugs.  REVIEW OF SYSTEMS:   Constitutional: Negative for fever,  chills, weight loss, +fatigue and diaphoresis.  HENT: Negative for hearing loss, ear pain, nosebleeds, congestion, +sore throat, neck pain, tinnitus and ear discharge.   Eyes: Negative for blurred vision, double vision, photophobia, pain, discharge and redness.  Respiratory: non-productive cough,no  hemoptysis, sputum production, + shortness of breath, + wheezing and stridor.   Cardiovascular: Negative for chest pain, palpitations, orthopnea, claudication, leg swelling and PND.  Gastrointestinal: severe heartburn prior to admit, still on-going, nausea, vomiting, abdominal pain, diarrhea,  constipation, blood in stool and melena.  Genitourinary: Negative for dysuria, urgency, frequency, hematuria and flank pain.  Musculoskeletal: Negative for myalgias, back pain, joint pain and falls.  Skin: Negative for itching and rash.  Neurological: Negative for dizziness, tingling, tremors, sensory change, speech change, focal weakness, seizures, loss of consciousness, weakness and headaches.  Endo/Heme/Allergies: Negative for environmental allergies and polydipsia. Does not bruise/bleed easily.  SUBJECTIVE:  Feels a little better  VITAL SIGNS: Temp:  [98.2 F (36.8 C)-98.5 F (36.9 C)] 98.2 F (36.8 C) (04/08 1146) Pulse Rate:  [64-73] 66 (04/08 1146) Resp:  [18-20] 18 (04/08 1146) BP: (110-170)/(58-80) 110/58 (04/08 1146) SpO2:  [93 %-100 %] 100 % (04/08 1146)  PHYSICAL EXAMINATION: General: 47 year old African-American female resting quietly in bed no acute distress Neuro: Awake alert no focal deficits HEENT: Cephalic atraumatic.  Mucous membranes moist, no appreciative jugular venous distention.  She does have marked upper airway pseudo-wheeze.  Her posterior pharynx is blotchy areas of erythema Cardiovascular: Regular rate and rhythm Lungs: Diffuse expiratory wheeze no accessory use Abdomen: Obese, soft, nontender.  No organomegaly Musculoskeletal: Equal strength and bulk Skin: Warm and dry does have some dependent edema  Recent Labs  Lab 07/28/17 2147 07/30/17 0437 08/01/17 0540  NA 138 137 133*  K 3.3* 3.8 3.9  CL 104 99* 99*  CO2 _0 BUN _1 CREATININE 0.86 0.67 0.80  GLUCOSE 304* 239* 329*   Recent Labs  Lab 07/28/17 2147 07/30/17 0437 08/01/17 0540  HGB 12.2 11.6* 11.7*  HCT 39.6 37.3 37.4  WBC 20.7* 17.4* 18.3*  PLT 334 356 417*   No results found.  ASSESSMENT / PLAN:  Asthmatic exacerbation On-going dyspnea  Probably Laryngopharyngeal reflux disease  OSA  GERD Possible esophagitis  Morbid obesity  Chronic diastolic  HF HTN Tobacco abuse   Discussion  Ongoing multi-factorial dyspnea in the setting of asthmatic exacerbation, most likely complicated by a laryngal pharyngeal reflux disease as a consequence of ongoing GERD, also possible Candida esophagitis (high-dose steroids and recent antibiotics).  I also think medical noncompliance is a contributing factor, she is not been using CPAP here in the hospital and reports she uses it "most nights for 4 hours" at home.  Plan/rec Continue Pepcid twice daily Change pantoprazole to twice daily Continue Dulera twice daily  Steroid taper, will change her to 40 every 12 Lasix x1 Chest x-ray a.m. also send BNP Empirically start nystatin oral  Not unusual for upper airway irritation to take several days to subside.  Compliance with her medical regimen is key.  Erick Colace ACNP-BC [place] Pager # [phone] OR # [phone] if no answer  08/04/2017, 12:41 PM

## 2017-08-04 NOTE — Progress Notes (Signed)
Patient is alert and oriented with no distress asked for a increased dose of ambien md denied increasing dose gave ordered does as requested and patient is up out of bed walking the halls and and sitting outside the door. Patient has done this before. It was report that patient is non complaint an leaves the floor to smoke and meet family members for medications.

## 2017-08-05 ENCOUNTER — Telehealth: Payer: Self-pay

## 2017-08-05 ENCOUNTER — Inpatient Hospital Stay (HOSPITAL_COMMUNITY): Payer: Medicaid Other

## 2017-08-05 DIAGNOSIS — J9621 Acute and chronic respiratory failure with hypoxia: Secondary | ICD-10-CM

## 2017-08-05 LAB — BLOOD GAS, ARTERIAL
Acid-Base Excess: 5.9 mmol/L — ABNORMAL HIGH (ref 0.0–2.0)
BICARBONATE: 30.2 mmol/L — AB (ref 20.0–28.0)
DRAWN BY: 448981
O2 Content: 3 L/min
O2 Saturation: 94.2 %
PCO2 ART: 46.8 mmHg (ref 32.0–48.0)
PH ART: 7.425 (ref 7.350–7.450)
Patient temperature: 98.3
pO2, Arterial: 71.1 mmHg — ABNORMAL LOW (ref 83.0–108.0)

## 2017-08-05 LAB — SPIROMETRY WITH GRAPH
FEF 25-75 Pre: 0.26 L/sec
FEF2575-%PRED-PRE: 9 %
FEV1-%Pred-Pre: 25 %
FEV1-PRE: 0.67 L
FEV1FVC-%Pred-Pre: 74 %
FEV6-%Pred-Pre: 33 %
FEV6-Pre: 1.05 L
FEV6FVC-%PRED-PRE: 99 %
FVC-%Pred-Pre: 33 %
FVC-PRE: 1.08 L
PRE FEV6/FVC RATIO: 97 %
Pre FEV1/FVC ratio: 62 %

## 2017-08-05 LAB — BASIC METABOLIC PANEL
Anion gap: 10 (ref 5–15)
BUN: 15 mg/dL (ref 6–20)
CALCIUM: 8 mg/dL — AB (ref 8.9–10.3)
CO2: 27 mmol/L (ref 22–32)
CREATININE: 0.75 mg/dL (ref 0.44–1.00)
Chloride: 99 mmol/L — ABNORMAL LOW (ref 101–111)
GFR calc non Af Amer: >60 mL/min (ref >60)
Glucose, Bld: 252 mg/dL — ABNORMAL HIGH (ref 65–99)
Potassium: 3.9 mmol/L (ref 3.5–5.1)
SODIUM: 136 mmol/L (ref 135–145)

## 2017-08-05 LAB — GLUCOSE, CAPILLARY
GLUCOSE-CAPILLARY: 190 mg/dL — AB (ref 65–99)
GLUCOSE-CAPILLARY: 178 mg/dL — AB (ref 65–99)
GLUCOSE-CAPILLARY: 208 mg/dL — AB (ref 65–99)
Glucose-Capillary: 265 mg/dL — ABNORMAL HIGH (ref 65–99)

## 2017-08-05 MED ORDER — IPRATROPIUM-ALBUTEROL 0.5-2.5 (3) MG/3ML IN SOLN
3.0000 mL | Freq: Four times a day (QID) | RESPIRATORY_TRACT | Status: DC
  Administered 2017-08-06: 3 mL via RESPIRATORY_TRACT
  Filled 2017-08-05 (×2): qty 3

## 2017-08-05 MED ORDER — FUROSEMIDE 40 MG PO TABS: 40.0000 mg | ORAL_TABLET | Freq: Every day | ORAL | 11 refills | Status: DC

## 2017-08-05 MED ORDER — NYSTATIN 100000 UNIT/ML MT SUSP: 5.0000 mL | Freq: Four times a day (QID) | OROMUCOSAL | 0 refills | Status: DC

## 2017-08-05 MED ORDER — NICOTINE 7 MG/24HR TD PT24: 7.0000 mg | MEDICATED_PATCH | Freq: Every day | TRANSDERMAL | 0 refills | Status: DC

## 2017-08-05 MED ORDER — PANTOPRAZOLE SODIUM 40 MG PO TBEC: 40.0000 mg | DELAYED_RELEASE_TABLET | Freq: Two times a day (BID) | ORAL | 0 refills | Status: DC

## 2017-08-05 MED ORDER — PREDNISONE 20 MG PO TABS
40.0000 mg | ORAL_TABLET | Freq: Every day | ORAL | Status: DC
  Administered 2017-08-06: 40 mg via ORAL
  Filled 2017-08-05: qty 2

## 2017-08-05 MED ORDER — IPRATROPIUM-ALBUTEROL 0.5-2.5 (3) MG/3ML IN SOLN: 3.0000 mL | Freq: Four times a day (QID) | RESPIRATORY_TRACT | 0 refills | Status: DC

## 2017-08-05 MED ORDER — PREDNISONE 20 MG PO TABS: 20.0000 mg | ORAL_TABLET | Freq: Every day | ORAL | Status: DC

## 2017-08-05 NOTE — Progress Notes (Signed)
Patient awakened by lab blood drawn  With a cough asked for PRN breathing treament. Given via mask.

## 2017-08-05 NOTE — Telephone Encounter (Signed)
Call received from the patient. She stated that she thinks she may be going home today but if she does go home she feels that she will be right back in the hospital tonight. She said that she does not feel that she is ready to go home.  Call placed to Olga Coaster, RN CM and informed her of the call that was received from the patient.

## 2017-08-05 NOTE — Progress Notes (Signed)
SATURATION QUALIFICATIONS: (This note is used to comply with regulatory documentation for home oxygen)  Patient Saturations on Room Air at Rest = 96%  Patient Saturations on Room Air while Ambulating =95%  Patient Saturations on 0 Liters of oxygen while Ambulating = 96%  Started off at 96%, drop down to 95% while ambulating, went back up to 96% on RA. Did not use O2 for ambulation.

## 2017-08-05 NOTE — Consult Note (Addendum)
Name: April [name] MRN: [id number] DOB: 1970/08/02    ADMISSION DATE:  07/29/2017 CONSULTATION DATE:  4/8   REFERRING MD :  hongalgi   CHIEF COMPLAINT:  Asthma exacerbation not improving   BRIEF PATIENT DESCRIPTION:  47 year old female with history of morbid obesity, chronic severe asthma, hypertension, atrial fibrillation, chronic diastolic dysfunction, as well as medical noncompliance.  Admitted 4/2 with a working diagnosis of asthmatic exacerbation, treated appropriately with systemic steroids, supplemental oxygen, and bronchodilators.  Pulmonary asked to see on 4/8 due to ongoing wheezing and slow return to baseline.  SIGNIFICANT EVENTS   STUDIES:     HISTORY OF PRESENT ILLNESS:    This is a 47 year old female with history of chronic severe asthma, complicated by medical noncompliance, OSA, allergic rhinitis and GERD.  Also has a history of atrial fibrillation on Eliquis, morbid obesity, hypertension, and depression.  She is followed by Dr. Annamaria Boots in our office.  She was last seen March 2019 in our office.  She is maintained on oral prednisone 10 mg daily, nebulized bronchodilators, Antihistamines, and Xolair.  She presented to the emergency room on 4/2 with chief complaint of worsening shortness of breath.  She recently discharged 2 weeks prior for asthmatic exacerbation complicated by atrial fibrillation.  She reported her symptoms began approximately 1 week prior to presentation.  She denied cough or chest pain, also denied fever or chills.  She was having increasing wheezing, no sick exposures.  Was admitted with a working diagnosis of recurrent asthmatic exacerbation and treated with scheduled bronchodilators, supplemental oxygen, and IV systemic steroids.  She was also noted to have hypertension for which her antihypertensive regimen was adjusted.  Since hospital admit her course has been complicated by: Refusing BiPAP per RT notes, leaving off the floor [place], walkingAnd  ambulating without her oxygen.  Her oxygen requirements have improved, but she continued to "wheeze" and on 4/6 steroid dosing was increased.  We were asked to see on 4/7 because of ongoing wheezing   EEVENTS 07/29/2017 08/05/17 - ccm consult   SUBJECTIVE/OVERNIGHT/INTERVAL HX 08/05/2017 - seen by staff goin gout to smoke. Unclear if better or not. Lot of concern that wheezin is upper airway. Patient is on 3L o2 here. Says at home she only needs o2 when she is sick.Primary team feel she is below dry weight.      General Appearance:    Looks OBESE - ++++  Head:    Normocephalic, without obvious abnormality, atraumatic  Eyes:    PERRL - yes, conjunctiva/corneas - clea5      Ears:    Normal external ear canals, both ears  Nose:   NG tube - no bu5 has [place] o2  Throat:  ETT TUBE - no , OG tube - no  Neck:   Supple,  No enlargement/tenderness/nodules     Lungs:     No distress but wheezy +. Unclear if upper airway v lower airway  Chest wall:    No deformity  Heart:    S1 and S2 normal, no murmur, CVP - no.  Pressors - no  Abdomen:     Soft, no masses, no organomegaly  Genitalia:    Not done  Rectal:   not done  Extremities:   Extremities- intact. No edema     Skin:   Intact in exposed areas .     Neurologic:   Sedation - nonena -> RASS - na . Moves all 4s - yes. CAM-ICU - neg . Orientation -  x3+    PULMONARY No results for input(s): PHART, PCO2ART, PO2ART, HCO3, TCO2, O2SAT in the last 168 hours.  Invalid input(s): PCO2, PO2  CBC Recent Labs  Lab 07/30/17 0437 08/01/17 0540  HGB 11.6* 11.7*  HCT 37.3 37.4  WBC 17.4* 18.3*  PLT 356 417*    COAGULATION No results for input(s): INR in the last 168 hours.  CARDIAC  No results for input(s): TROPONINI in the last 168 hours. No results for input(s): PROBNP in the last 168 hours.   CHEMISTRY Recent Labs  Lab 07/30/17 0437 08/01/17 0540 08/05/17 0606  NA 137 133* 136  K 3.8 3.9 3.9  CL 99* 99* 99*  CO2 25 23 27   GLUCOSE  239* 329* 252*  BUN 9 18 15   CREATININE 0.67 0.80 0.75  CALCIUM 8.4* 8.1* 8.0*  MG 2.1 2.2  --    Estimated Creatinine Clearance: 138.7 mL/min (by C-G formula based on SCr of 0.75 mg/dL).   LIVER No results for input(s): AST, ALT, ALKPHOS, BILITOT, PROT, ALBUMIN, INR in the last 168 hours.   INFECTIOUS No results for input(s): LATICACIDVEN, PROCALCITON in the last 168 hours.   ENDOCRINE CBG (last 3)  Recent Labs    08/04/17 1610 08/04/17 2115 08/05/17 0808  GLUCAP 288* 326* 190*         IMAGING x48h  - image(s) personally visualized  -   highlighted in bold Dg Chest Port 1 View  Result Date: 08/05/2017 CLINICAL DATA:  Shortness of breath. EXAM: PORTABLE CHEST 1 VIEW COMPARISON:  07/28/2017. FINDINGS: Mediastinum and hilar structures are normal. Lungs are clear. No pleural effusion or pneumothorax. No acute bony abnormality. IMPRESSION: No acute cardiac pulmonary disease. Electronically Signed   By: Marcello Moores  Register   On: 08/05/2017 07:39     ASSESSMENT/PLAN  Acute on chronic respiratory failure with hypoxemia (HCC) Unclear if better or not Obesity and hx of non compliance and hx of psuedowheeze all making it difficult to evalutate Still VCD, COPD, and acute on chronic dias dysfn weigh  Plan - hold off dc 08/05/2017 - walk test on room air - abg - flow volume loop inpatient spir to look for vcd - disharge decision based on this 0 continue steroids, night cpap - recommend basal ongoing lasix   D.w Dr Verlon Au   Dr. Brand Males, M.D., [facility].C.P Pulmonary and Critical Care Medicine Staff Physician, [place] Director - Interstitial Lung Disease  Program  Pulmonary [place] [facility] [place], [place], [address]  Pager: [phone], If no answer or between  15:00h - 7:00h: call [phone] Telephone: [phone]

## 2017-08-05 NOTE — Progress Notes (Signed)
Received call from Opal Sidles [facility]) CM informed Opal Sidles that patient continues to leave the floor against medical advice multiple times; up walking independently talking on cell phone; No needs identified at this time; Aneta Mins [phone]

## 2017-08-05 NOTE — Assessment & Plan Note (Addendum)
Unclear if better or not Obesity and hx of non compliance and hx of psuedowheeze all making it difficult to evalutate Still VCD, COPD, and acute on chronic dias dysfn weigh  Plan - hold off dc 08/05/2017 - walk test on room air - abg - flow volume loop inpatient spir to look for vcd - disharge decision based on this 0 continue steroids, night cpap - recommend basal ongoing lasix

## 2017-08-05 NOTE — Plan of Care (Signed)
  Problem: Coping: Goal: Level of anxiety will decrease Outcome: Not Progressing Note:  Patient is not complaint with diet and fluid retention   Problem: Activity: Goal: Capacity to carry out activities will improve Outcome: Progressing Note:  Up walking

## 2017-08-05 NOTE — Progress Notes (Signed)
Ambulated with patient in hallway. The lowest her oxygen dropped was 95% on room air. Highest was 96% on room air. Did not make it the full six minutes because patient was tired.

## 2017-08-05 NOTE — Progress Notes (Signed)
Name: April [name] MRN: [id number] DOB: 11/03/1970    ADMISSION DATE:  07/29/2017 CONSULTATION DATE:  4/8   REFERRING MD :  [email or web address]   CHIEF COMPLAINT:  Asthma exacerbation not improving   HISTORY OF PRESENT ILLNESS:    This is a 47 year old female with history of chronic severe asthma, complicated by medical noncompliance, OSA, allergic rhinitis and GERD.  Also has a history of atrial fibrillation on Eliquis, morbid obesity, hypertension, and depression.  She is followed by Dr. Annamaria Boots in our office.  She was last seen March 2019 in our office.  She is maintained on oral prednisone 10 mg daily, nebulized bronchodilators, Antihistamines, and Xolair.  She presented to the emergency room on 4/2 with chief complaint of worsening shortness of breath.  She recently discharged 2 weeks prior for asthmatic exacerbation complicated by atrial fibrillation.  She reported her symptoms began approximately 1 week prior to presentation.  She denied cough or chest pain, also denied fever or chills.  She was having increasing wheezing, no sick exposures.  Was admitted with a working diagnosis of recurrent asthmatic exacerbation and treated with scheduled bronchodilators, supplemental oxygen, and IV systemic steroids.  She was also noted to have hypertension for which her antihypertensive regimen was adjusted.  Since hospital admit her course has been complicated by: Refusing BiPAP per RT notes, leaving off the floor [place], walkingAnd ambulating without her oxygen.  Her oxygen requirements have improved, but she continued to "wheeze" and on 4/6 steroid dosing was increased.  We were asked to see on 4/7 because of ongoing wheezing   SUBJECTIVE:  States she feels the same if not worse- unable to further elaborate  On review of CPAP, patient has worn at 5 mmHg for cumulative total of 1.5 hours over the last 3 days.  She request nasal prongs which are not available.  She is unable to state what she uses or  her settings at home.  Ambulatory walk test without desaturation  VITAL SIGNS: Temp:  [97.9 F (36.6 C)-98.3 F (36.8 C)] 98 F (36.7 C) (04/09 1043) Pulse Rate:  [65-78] 70 (04/09 1043) Resp:  [18] 18 (04/09 0523) BP: (110-185)/(58-93) 159/82 (04/09 1043) SpO2:  [97 %-100 %] 100 % (04/09 1043) Weight:  [343 lb 3.2 oz (155.7 kg)] 343 lb 3.2 oz (155.7 kg) (04/09 0523)  PHYSICAL EXAMINATION: General:  Adult morbidly obese female sitting in bed in NAD HEENT: MM pink/moist, transmitted upper airway wheeze present Neuro: A/O, non focal CV: s1s2 rrr, no m/r/g PULM: normal rate and WOB on RA, diffuse expiratory wheeze, speaking full sentences GI: obese, soft, bs active  Extremities: warm/dry, BLE dependent edema  Skin: no rashes  Recent Labs  Lab 07/30/17 0437 08/01/17 0540 08/05/17 0606  NA 137 133* 136  K 3.8 3.9 3.9  CL 99* 99* 99*  CO2 25 23 27   BUN 9 18 15   CREATININE 0.67 0.80 0.75  GLUCOSE 239* 329* 252*   Recent Labs  Lab 07/30/17 0437 08/01/17 0540  HGB 11.6* 11.7*  HCT 37.3 37.4  WBC 17.4* 18.3*  PLT 356 417*   Dg Chest Port 1 View  Result Date: 08/05/2017 CLINICAL DATA:  Shortness of breath. EXAM: PORTABLE CHEST 1 VIEW COMPARISON:  07/28/2017. FINDINGS: Mediastinum and hilar structures are normal. Lungs are clear. No pleural effusion or pneumothorax. No acute bony abnormality. IMPRESSION: No acute cardiac pulmonary disease. Electronically Signed   By: [place]   On: 08/05/2017 07:39   Office Spirometry  07/29/2016-moderately severe obstructive airways disease. FVC 2.21/68%, FEV1 1.53/57%, ratio 0.69, FEF 25-75% 0.98/34%   BRIEF PATIENT DESCRIPTION:  47 year old female with history of morbid obesity, chronic severe asthma, hypertension, atrial fibrillation, chronic diastolic dysfunction, as well as medical noncompliance.  Admitted 4/2 with a working diagnosis of asthmatic exacerbation, treated appropriately with systemic steroids, supplemental oxygen, and  bronchodilators.  Pulmonary asked to see on 4/8 due to ongoing wheezing and slow return to baseline.  ASSESSMENT / PLAN:  Asthmatic exacerbation On-going dyspnea  Probably Laryngopharyngeal reflux disease  OSA  GERD Possible esophagitis  Morbid obesity  Chronic diastolic HF HTN Tobacco abuse  Discussion  Ongoing multi-factorial dyspnea in the setting of asthmatic exacerbation, most likely complicated by a laryngal pharyngeal reflux disease as a consequence of ongoing GERD, also possible Candida esophagitis (high-dose steroids and recent antibiotics), vs possible VCD  Medical noncompliance is a contributing factor.  - ambulatory walk test without hypoxia on room air - CXR this am with no acute process, BNP 82  Plan/rec ABG pending Will obtain spirometry with flow volume loop to assess for VCD Continue Pepcid twice daily Change pantoprazole to twice daily Continue Dulera twice daily  Continue Steroid taper, 40 every 12 continue nystatin oral  Stressed the importance of CPAP compliance at home, recommended settings of CPAP with auto titration of 5-20  Will need to follow-up in our office with Dr. Annamaria Boots on as scheduled May 9 at 4 pm, or call the office sooner to be seen earlier if further problems arise.  Further reccomendations to follow per attending.  Kennieth Rad, AGACNP-BC [facility] Pgr: [phone] or if no answer [phone] 08/05/2017, 11:23 AM

## 2017-08-05 NOTE — Progress Notes (Signed)
Physician Discharge Summary  April [name] ASN:[id number] DOB: 08-28-1970 DOA: 07/29/2017  PCP: [name], MD  Admit date: 07/29/2017 Discharge date: 08/05/2017  Time spent: 25 minutes  Recommendations for Outpatient Follow-up:  1. Complete nystatin 08/12/17, also new medication of prednisone this admission 2. Encourage outpatient cessation of smoking, needs also discussion about weight loss procedures 3. Patient will need titration of CPAP as an outpatient and question of Xolair as well as continued smoking cessation discussions-this was discussed with the patient 4. Would recommend continuation of glipizide as an outpatient   Discharge Diagnoses:  Principal Problem:   Acute respiratory failure with hypoxia ([place]) Active Problems:   Essential hypertension   OSA on CPAP   Malignant hypertension due to primary aldosteronism (HCC)   Moderate persistent asthma with exacerbation   Chronic diastolic CHF (congestive heart failure) (HCC)   Unspecified atrial fibrillation [facility])   Discharge Condition: Improved  Diet recommendation: Diabetic heart healthy  Filed Weights   08/02/17 0638 08/05/17 0523  Weight: (!) 157.5 kg (347 lb 3.2 oz) (!) 155.7 kg (343 lb 3.82 oz)    47 year old African-American female, HTN, BMI 51, OSA on CPAP,?  COPD, bipolar, recent admission 3/11-3/16 with acute exacerbation of COPD She has been seen multiple times in the hospital and has had plans for initiation of Xolair and may be titration of CPAP in the outpatient setting She was hospitalized again 4/2 with acute exacerbation of COPD versus vocal cord dysfunction   Acute respiratory failure secondary to restrictive habitus + underlying COPD asthma-patient will continue oxygen at home Pulmonology has administered medications continue Pepcid twice daily, PPI twice daily, Dulera and echo was performed-note that BNP was only 80 and weight is down from 355 on last hospital discharge to current weight of 344 If they  feel that she can probably discharge she can go home later today  Candida esophagitis?  Reasonable to complete 7 days of treatment, would not treat empirically beyond that amount of time, prescription given on discharge  Paroxysmal atrial fibrillation chads score >2+ S/B DC CV 05/2017, left lower leg pain without swelling Do not feel she has a DVT Discharged back on Eliquis Cardizem and metoprolol on discharge  ?  Heart failure Patient had echo 10/03/2016 EF 55-60 she looks compensated clinically-if she is not compliant on her CPAP she probably would have pulmonary hypertension and this will not be picked up on her echo secondary to habitus.  Would not discharge patient on Lasix at this time as she is at 344 and her usual weight is 355  Obstructive sleep apnea on CPAP-we will need discussion with outpatient pulmonologist regarding titration of the same  Super morbid obesity BMI >50-high risk for death-she will need discussion in a frank manner with her PCP regarding weight loss strategies inclusive of surgical procedures   Discharge Exam: Vitals:   08/05/17 0627 08/05/17 0631  BP:    Pulse: 65 71  Resp:    Temp:    SpO2: 100% 100%    General: eomi ncat Cardiovascular:  s1 s 2no m/r/g Respiratory: clear no wheeze--transmitted upper resp sounds  Discharge Instructions   Discharge Instructions    Diet - low sodium heart healthy   Complete by:  As directed    Discharge instructions   Complete by:  As directed    You have been prescribed various medications for your breathing issues-I would recommend that you follow-up closely with Dr. Annamaria Boots in the outpatient setting to titrate your CPAP as well  as to use [name] is extremely important that you stop smoking and that you consider a weight loss procedure to help you lose weight-a large component of your breathing issues are probably secondary to these 2 issues.   Increase activity slowly   Complete by:  As directed      Allergies  as of 08/05/2017      Reactions   Contrast Media [iodinated Diagnostic Agents] Itching   Ct contrast      Medication List    STOP taking these medications   ACCU-CHEK AVIVA PLUS w/Device Kit   ACCU-CHEK SOFTCLIX LANCETS lancets   arformoterol 15 MCG/2ML Nebu Commonly known as:  BROVANA   blood glucose meter kit and supplies Kit   budesonide 0.25 MG/2ML nebulizer solution Commonly known as:  PULMICORT   furosemide 40 MG tablet Commonly known as:  LASIX   glucose blood test strip Commonly known as:  ACCU-CHEK AVIVA PLUS   loratadine 10 MG tablet Commonly known as:  CLARITIN   omeprazole 20 MG capsule Commonly known as:  PRILOSEC Replaced by:  pantoprazole 40 MG tablet   Vitamin D (Ergocalciferol) 50000 units Caps capsule Commonly known as:  DRISDOL   zolpidem 10 MG tablet Commonly known as:  AMBIEN     TAKE these medications   albuterol 108 (90 Base) MCG/ACT inhaler Commonly known as:  PROVENTIL HFA;VENTOLIN HFA Inhale 2 puffs into the lungs every 4 (four) hours as needed for wheezing or shortness of breath.   apixaban 5 MG Tabs tablet Commonly known as:  ELIQUIS Take 1 tablet (5 mg total) by mouth 2 (two) times daily.   budesonide-formoterol 160-4.5 MCG/ACT inhaler Commonly known as:  SYMBICORT Inhale 2 puffs into the lungs 2 (two) times daily.   busPIRone 7.5 MG tablet Commonly known as:  BUSPAR Take 1 tablet (7.5 mg total) 2 (two) times daily by mouth.   butalbital-acetaminophen-caffeine 50-325-40 MG tablet Commonly known as:  FIORICET, ESGIC Take 1-2 tablets by mouth every 6 (six) hours as needed for headache.   diltiazem 360 MG 24 hr capsule Commonly known as:  CARDIZEM CD Take 1 capsule (360 mg total) by mouth daily.   famotidine 20 MG tablet Commonly known as:  PEPCID Take 1 tablet (20 mg total) by mouth 2 (two) times daily.   fluticasone 50 MCG/ACT nasal spray Commonly known as:  FLONASE Place 2 sprays into both nostrils daily.   glipiZIDE  5 MG tablet Commonly known as:  GLUCOTROL Take 0.5 tablets (2.5 mg total) by mouth 2 (two) times daily before a meal.   guaiFENesin 600 MG 12 hr tablet Commonly known as:  MUCINEX Take 1 tablet (600 mg total) by mouth 2 (two) times daily.   ipratropium-albuterol 0.5-2.5 (3) MG/3ML Soln Commonly known as:  DUONEB Take 3 mLs by nebulization every 6 (six) hours.   losartan 100 MG tablet Commonly known as:  COZAAR Take 1 tablet (100 mg total) by mouth daily.   metoprolol tartrate 100 MG tablet Commonly known as:  LOPRESSOR Take 1 tablet (100 mg total) by mouth 2 (two) times daily.   mometasone-formoterol 200-5 MCG/ACT Aero Commonly known as:  DULERA Inhale 2 puffs into the lungs 2 (two) times daily.   montelukast 10 MG tablet Commonly known as:  SINGULAIR Take 1 tablet (10 mg total) by mouth at bedtime.   nicotine 7 mg/24hr patch Commonly known as:  NICODERM CQ - dosed in mg/24 hr Place 1 patch (7 mg total) onto the skin daily.  nystatin 100000 UNIT/ML suspension Commonly known as:  MYCOSTATIN Take 5 mLs (500,000 Units total) by mouth 4 (four) times daily for 6 days.   pantoprazole 40 MG tablet Commonly known as:  PROTONIX Take 1 tablet (40 mg total) by mouth 2 (two) times daily. Replaces:  omeprazole 20 MG capsule   predniSONE 20 MG tablet Commonly known as:  DELTASONE Take 1 tablet (20 mg total) by mouth daily with breakfast. What changed:    medication strength  how much to take   sertraline 100 MG tablet Commonly known as:  ZOLOFT Take 1 tablet (100 mg total) by mouth daily.   spironolactone 100 MG tablet Commonly known as:  ALDACTONE Take 1 tablet (100 mg total) by mouth daily.      Allergies  Allergen Reactions  . Contrast Media [Iodinated Diagnostic Agents] Itching    Ct contrast   Follow-up Information    [place] Follow up on 08/11/2017.   Why:  at 2:00pm for an appointment with Dr Margarita Rana,  Contact  information: [place] [id number] [phone]           The results of significant diagnostics from this hospitalization (including imaging, microbiology, ancillary and laboratory) are listed below for reference.    Significant Diagnostic Studies: Dg Chest 2 View  Result Date: 07/28/2017 CLINICAL DATA:  Chest pain and shortness of breath for 1 day. History of COPD and pneumonia. EXAM: CHEST - 2 VIEW COMPARISON:  Chest radiograph August 07, 2017 FINDINGS: Cardiomediastinal silhouette is normal. No pleural effusions or focal consolidations. Bronchitic changes. Trachea projects midline and there is no pneumothorax. Soft tissue planes and included osseous structures are non-suspicious. Large body habitus. IMPRESSION: Bronchitic changes without focal consolidation. Electronically Signed   By: Elon Alas M.D.   On: 07/28/2017 22:22   Dg Chest 2 View  Result Date: 07/07/2017 CLINICAL DATA:  47 year old female with shortness of breath and wheezing. EXAM: CHEST - 2 VIEW COMPARISON:  Chest radiograph dated 06/05/2017 FINDINGS: Minimal left lung base atelectatic changes. No focal consolidation, pleural effusion, or pneumothorax. Stable cardiac silhouette. No acute osseous pathology. IMPRESSION: No active cardiopulmonary disease. Electronically Signed   By: Anner Crete M.D.   On: 07/07/2017 03:48   Dg Chest Port 1 View  Result Date: 08/05/2017 CLINICAL DATA:  Shortness of breath. EXAM: PORTABLE CHEST 1 VIEW COMPARISON:  07/28/2017. FINDINGS: Mediastinum and hilar structures are normal. Lungs are clear. No pleural effusion or pneumothorax. No acute bony abnormality. IMPRESSION: No acute cardiac pulmonary disease. Electronically Signed   By: Marcello Moores  Register   On: 08/05/2017 07:39    Microbiology: No results found for this or any previous visit (from the past 240 hour(s)).   Labs: Basic Metabolic Panel: Recent Labs  Lab 07/30/17 0437 08/01/17 0540  08/05/17 0606  NA 137 133* 136  K 3.8 3.9 3.9  CL 99* 99* 99*  CO2 '25 23 27  '$ GLUCOSE 239* 329* 252*  BUN '9 18 15  '$ CREATININE 0.67 0.80 0.75  CALCIUM 8.4* 8.1* 8.0*  MG 2.1 2.2  --    Liver Function Tests: No results for input(s): AST, ALT, ALKPHOS, BILITOT, PROT, ALBUMIN in the last 168 hours. No results for input(s): LIPASE, AMYLASE in the last 168 hours. No results for input(s): AMMONIA in the last 168 hours. CBC: Recent Labs  Lab 07/30/17 0437 08/01/17 0540  WBC 17.4* 18.3*  NEUTROABS  --  16.2*  HGB 11.6* 11.7*  HCT 37.3  37.4  MCV 86.7 86.8  PLT 356 417*   Cardiac Enzymes: No results for input(s): CKTOTAL, CKMB, CKMBINDEX, TROPONINI in the last 168 hours. BNP: BNP (last 3 results) Recent Labs    05/26/17 0757 07/29/17 0734 08/04/17 1342  BNP 33.9 46.6 82.9    ProBNP (last 3 results) No results for input(s): PROBNP in the last 8760 hours.  CBG: Recent Labs  Lab 08/04/17 0753 08/04/17 1201 08/04/17 1610 08/04/17 2115 08/05/17 0808  GLUCAP 315* 357* 288* 326* 190*       Signed:  Nita Sells MD   [facility] 08/05/2017, 9:23 AM

## 2017-08-06 ENCOUNTER — Other Ambulatory Visit: Payer: Self-pay

## 2017-08-06 ENCOUNTER — Emergency Department (HOSPITAL_COMMUNITY): Payer: Medicaid Other

## 2017-08-06 ENCOUNTER — Inpatient Hospital Stay (HOSPITAL_COMMUNITY)
Admission: EM | Admit: 2017-08-06 | Discharge: 2017-08-18 | Disposition: A | Discharge status: Other Acute Inpatient Hospital | Payer: Medicaid Other | Source: Home / Self Care | Attending: Pulmonary Disease | Admitting: Pulmonary Disease

## 2017-08-06 ENCOUNTER — Encounter (HOSPITAL_COMMUNITY): Payer: Self-pay | Admitting: Emergency Medicine

## 2017-08-06 DIAGNOSIS — Z6841 Body Mass Index (BMI) 40.0 and over, adult: Secondary | ICD-10-CM

## 2017-08-06 DIAGNOSIS — Y95 Nosocomial condition: Secondary | ICD-10-CM

## 2017-08-06 DIAGNOSIS — M25562 Pain in left knee: Secondary | ICD-10-CM

## 2017-08-06 DIAGNOSIS — Z720 Tobacco use: Secondary | ICD-10-CM | POA: Diagnosis present

## 2017-08-06 DIAGNOSIS — G4733 Obstructive sleep apnea (adult) (pediatric): Secondary | ICD-10-CM

## 2017-08-06 DIAGNOSIS — J80 Acute respiratory distress syndrome: Secondary | ICD-10-CM

## 2017-08-06 DIAGNOSIS — M25561 Pain in right knee: Secondary | ICD-10-CM | POA: Diagnosis present

## 2017-08-06 DIAGNOSIS — K219 Gastro-esophageal reflux disease without esophagitis: Secondary | ICD-10-CM | POA: Diagnosis present

## 2017-08-06 DIAGNOSIS — J189 Pneumonia, unspecified organism: Secondary | ICD-10-CM | POA: Diagnosis present

## 2017-08-06 DIAGNOSIS — R6510 Systemic inflammatory response syndrome (SIRS) of non-infectious origin without acute organ dysfunction: Secondary | ICD-10-CM | POA: Diagnosis present

## 2017-08-06 DIAGNOSIS — I10 Essential (primary) hypertension: Secondary | ICD-10-CM | POA: Diagnosis present

## 2017-08-06 DIAGNOSIS — D72829 Elevated white blood cell count, unspecified: Secondary | ICD-10-CM | POA: Diagnosis present

## 2017-08-06 DIAGNOSIS — J441 Chronic obstructive pulmonary disease with (acute) exacerbation: Secondary | ICD-10-CM

## 2017-08-06 DIAGNOSIS — J111 Influenza due to unidentified influenza virus with other respiratory manifestations: Secondary | ICD-10-CM

## 2017-08-06 DIAGNOSIS — I5032 Chronic diastolic (congestive) heart failure: Secondary | ICD-10-CM

## 2017-08-06 DIAGNOSIS — Z452 Encounter for adjustment and management of vascular access device: Secondary | ICD-10-CM

## 2017-08-06 DIAGNOSIS — I482 Chronic atrial fibrillation, unspecified: Secondary | ICD-10-CM | POA: Diagnosis present

## 2017-08-06 DIAGNOSIS — J9600 Acute respiratory failure, unspecified whether with hypoxia or hypercapnia: Secondary | ICD-10-CM

## 2017-08-06 DIAGNOSIS — J4541 Moderate persistent asthma with (acute) exacerbation: Secondary | ICD-10-CM | POA: Diagnosis present

## 2017-08-06 DIAGNOSIS — Z978 Presence of other specified devices: Secondary | ICD-10-CM

## 2017-08-06 DIAGNOSIS — Z9989 Dependence on other enabling machines and devices: Secondary | ICD-10-CM

## 2017-08-06 DIAGNOSIS — J9621 Acute and chronic respiratory failure with hypoxia: Secondary | ICD-10-CM | POA: Diagnosis present

## 2017-08-06 DIAGNOSIS — A419 Sepsis, unspecified organism: Secondary | ICD-10-CM

## 2017-08-06 DIAGNOSIS — R0602 Shortness of breath: Secondary | ICD-10-CM

## 2017-08-06 DIAGNOSIS — I480 Paroxysmal atrial fibrillation: Secondary | ICD-10-CM | POA: Diagnosis present

## 2017-08-06 DIAGNOSIS — J9601 Acute respiratory failure with hypoxia: Secondary | ICD-10-CM | POA: Diagnosis present

## 2017-08-06 LAB — COMPREHENSIVE METABOLIC PANEL
ALK PHOS: 89 U/L (ref 38–126)
ALT: 28 U/L (ref 14–54)
AST: 25 U/L (ref 15–41)
Albumin: 3.2 g/dL — ABNORMAL LOW (ref 3.5–5.0)
Anion gap: 14 (ref 5–15)
BUN: 18 mg/dL (ref 6–20)
CALCIUM: 8.7 mg/dL — AB (ref 8.9–10.3)
CHLORIDE: 101 mmol/L (ref 101–111)
CO2: 25 mmol/L (ref 22–32)
CREATININE: 0.88 mg/dL (ref 0.44–1.00)
GFR calc non Af Amer: >60 mL/min (ref >60)
Glucose, Bld: 237 mg/dL — ABNORMAL HIGH (ref 65–99)
Potassium: 4.3 mmol/L (ref 3.5–5.1)
Sodium: 140 mmol/L (ref 135–145)
TOTAL PROTEIN: 6.6 g/dL (ref 6.5–8.1)
Total Bilirubin: 0.6 mg/dL (ref 0.3–1.2)

## 2017-08-06 LAB — URINALYSIS, ROUTINE W REFLEX MICROSCOPIC
Bacteria, UA: NONE SEEN
Bilirubin Urine: NEGATIVE
GLUCOSE, UA: NEGATIVE mg/dL
Ketones, ur: NEGATIVE mg/dL
LEUKOCYTES UA: NEGATIVE
NITRITE: NEGATIVE
PROTEIN: NEGATIVE mg/dL
SPECIFIC GRAVITY, URINE: 1.019 (ref 1.005–1.030)
pH: 6 (ref 5.0–8.0)

## 2017-08-06 LAB — GLUCOSE, CAPILLARY: GLUCOSE-CAPILLARY: 195 mg/dL — AB (ref 65–99)

## 2017-08-06 LAB — I-STAT CG4 LACTIC ACID, ED
LACTIC ACID, VENOUS: 3.38 mmol/L — AB (ref 0.5–1.9)
Lactic Acid, Venous: 3.88 mmol/L (ref 0.5–1.9)

## 2017-08-06 MED ORDER — VANCOMYCIN HCL 10 G IV SOLR
1500.0000 mg | Freq: Once | INTRAVENOUS | Status: AC
  Administered 2017-08-07: 1500 mg via INTRAVENOUS
  Filled 2017-08-06: qty 1500

## 2017-08-06 MED ORDER — MAGNESIUM SULFATE 2 GM/50ML IV SOLN
2.0000 g | Freq: Once | INTRAVENOUS | Status: AC
  Administered 2017-08-06: 2 g via INTRAVENOUS
  Filled 2017-08-06: qty 50

## 2017-08-06 MED ORDER — SODIUM CHLORIDE 0.9 % IV SOLN
2.0000 g | Freq: Once | INTRAVENOUS | Status: AC
  Administered 2017-08-06: 2 g via INTRAVENOUS
  Filled 2017-08-06: qty 2

## 2017-08-06 MED ORDER — SODIUM CHLORIDE 0.9 % IV BOLUS
1000.0000 mL | Freq: Once | INTRAVENOUS | Status: AC
  Administered 2017-08-06: 1000 mL via INTRAVENOUS

## 2017-08-06 MED ORDER — ALBUTEROL (5 MG/ML) CONTINUOUS INHALATION SOLN
10.0000 mg/h | INHALATION_SOLUTION | RESPIRATORY_TRACT | Status: DC
  Administered 2017-08-06: 10 mg/h via RESPIRATORY_TRACT
  Filled 2017-08-06: qty 20

## 2017-08-06 MED ORDER — ALBUTEROL (5 MG/ML) CONTINUOUS INHALATION SOLN
10.0000 mg/h | INHALATION_SOLUTION | RESPIRATORY_TRACT | Status: DC
  Filled 2017-08-06: qty 20

## 2017-08-06 MED ORDER — VANCOMYCIN HCL IN DEXTROSE 1-5 GM/200ML-% IV SOLN
1000.0000 mg | Freq: Once | INTRAVENOUS | Status: AC
  Administered 2017-08-06: 1000 mg via INTRAVENOUS
  Filled 2017-08-06: qty 200

## 2017-08-06 MED ORDER — IPRATROPIUM BROMIDE 0.02 % IN SOLN
0.5000 mg | Freq: Once | RESPIRATORY_TRACT | Status: AC
  Administered 2017-08-06: 0.5 mg via RESPIRATORY_TRACT
  Filled 2017-08-06: qty 2.5

## 2017-08-06 MED ORDER — ALBUTEROL SULFATE (2.5 MG/3ML) 0.083% IN NEBU
5.0000 mg | INHALATION_SOLUTION | Freq: Once | RESPIRATORY_TRACT | Status: AC
  Administered 2017-08-06: 5 mg via RESPIRATORY_TRACT
  Filled 2017-08-06: qty 6

## 2017-08-06 MED ORDER — ALBUTEROL (5 MG/ML) CONTINUOUS INHALATION SOLN
10.0000 mg/h | INHALATION_SOLUTION | Freq: Once | RESPIRATORY_TRACT | Status: DC
  Filled 2017-08-06: qty 20

## 2017-08-06 NOTE — Progress Notes (Signed)
Consults were received from an ED physician for vancomycin and cefepime per pharmacy dosing.  The patient's profile has been reviewed for ht/wt/allergies/indication/available labs.   The Provider has already place one-time orders for vancomycin 1gm and cefepime 2gm. No changes required.    Further antibiotics/pharmacy consults should be ordered by admitting physician if indicated.                       Thank you, Doreene Eland, PharmD, BCPS.   08/06/2017 9:10 PM

## 2017-08-06 NOTE — ED Notes (Signed)
I gave MD Cardama critical IStat CG4 result

## 2017-08-06 NOTE — H&P (Addendum)
[facility]                                                                                    Patient Demographics  April [name], is a 47 y.o. female  CSN: [id number]  MRN: [id number]  DOB - [birth year]  Admit Date - 08/06/2017  Outpatient Primary MD for the patient is [name], MD   With History of -  Past Medical History:  Diagnosis Date  . Acanthosis nigricans   . Anxiety   . Arthritis    "knees" (04/28/2017)  . Asthma    Followed by Dr. Annamaria Boots (pulmonology); receives every other week omalizumab injections; has frequent exacerbations  . COPD (chronic obstructive pulmonary disease) ([place])    PFTs in 2002, FEV1/FVC 65, no post bronchodilater test done  . Depression   . GERD (gastroesophageal reflux disease)   . Headache(784.0)    "q couple days" (04/28/2017)  . Helicobacter pylori (H. pylori) infection   . Hypertension, essential   . Insomnia   . Menorrhagia   . Morbid obesity ([place])   . OSA on CPAP    Sleep study 2008 - mild OSA, not enough events to titrate CPAP; wears CPAP now/pt on 04/28/2017  . Pneumonia X 1  . Seasonal allergies   . Shortness of breath   . Tobacco user       Past Surgical History:  Procedure Laterality Date  . CARDIOVERSION N/A 05/30/2017   Procedure: CARDIOVERSION;  Surgeon: Sanda Klein, MD;  Location: [facility];  Service: Cardiovascular;  Laterality: N/A;  . REDUCTION MAMMAPLASTY Bilateral 09/2011  . TUBAL LIGATION  1996   bilateral    in for   Chief Complaint  Patient presents with  . Wheezing  . Shortness of Breath     HPI  April [name]  is a 47 y.o. female, with past medical history significant for chronic severe asthmatic, noncompliance, A. fib, morbid obesity, hypertension, depression obstructive sleep apnea, allergic rhinitis and GERD,  discharged today from our facility after improvement .  Patient was treated appropriately with systemic steroids, supplemental oxygen and bronchodilators and pulmonary  cleared the patient.  Patient took her medications and went home where her symptoms started worsening again with fever and chills and she presented to the emergency room .  White blood cell count 21.7, with audible wheezes and rhonchi.    Review of Systems    In addition to the HPI above,   No Headache, No changes with Vision or hearing, No problems swallowing food or Liquids, No Chest pain,  No Abdominal pain, No Nausea or Vommitting, Bowel movements are regular, No Blood in stool or Urine, No dysuria, No new skin rashes or bruises, No new joints pains-aches,  No new weakness, tingling, numbness in any extremity, No recent weight gain or loss, No polyuria, polydypsia or polyphagia, No significant Mental Stressors.  A full 10 point Review of Systems was done, except as stated above, all other Review of Systems were negative.   Social History Social History   Tobacco Use  . Smoking status: Former Smoker    Packs/day: 0.50    Years: 26.00    Pack years:  13.00    Types: Cigarettes    Last attempt to quit: 09/12/2014    Years since quitting: 2.9  . Smokeless tobacco: Never Used  Substance Use Topics  . Alcohol use: No     Family History Family History  Problem Relation Age of Onset  . Hypertension Mother   . Asthma Daughter   . Cancer Paternal Aunt   . Asthma Maternal Grandmother      Prior to Admission medications   Medication Sig Start Date End Date Taking? Authorizing Provider  albuterol (PROVENTIL HFA;VENTOLIN HFA) 108 (90 Base) MCG/ACT inhaler Inhale 2 puffs into the lungs every 4 (four) hours as needed for wheezing or shortness of breath. 07/18/17  Yes Newlin, Enobong, MD  apixaban (ELIQUIS) 5 MG TABS tablet Take 1 tablet (5 mg total) by mouth 2 (two) times daily. 07/18/17  Yes [name], MD  budesonide-formoterol (SYMBICORT) 160-4.5 MCG/ACT inhaler Inhale 2 puffs into the lungs 2 (two) times daily. 07/14/17  Yes Young, Tarri Fuller D, MD  busPIRone (BUSPAR) 7.5  MG tablet Take 1 tablet (7.5 mg total) 2 (two) times daily by mouth. 03/12/17  Yes [name], MD  butalbital-acetaminophen-caffeine (FIORICET, ESGIC) 50-325-40 MG tablet Take 1-2 tablets by mouth every 6 (six) hours as needed for headache. 12/19/16 12/19/17 Yes Joy, Shawn C, PA-C  diltiazem (CARDIZEM CD) 360 MG 24 hr capsule Take 1 capsule (360 mg total) by mouth daily. 06/25/17  Yes [name], MD  famotidine (PEPCID) 20 MG tablet Take 1 tablet (20 mg total) by mouth 2 (two) times daily. 03/29/17  Yes Ward, Ozella Almond, PA-C  fluticasone (FLONASE) 50 MCG/ACT nasal spray Place 2 sprays into both nostrils daily. 12/13/16  Yes Rai, Ripudeep K, MD  glipiZIDE (GLUCOTROL) 5 MG tablet Take 0.5 tablets (2.5 mg total) by mouth 2 (two) times daily before a meal. 07/18/17  Yes Newlin, Enobong, MD  guaiFENesin (MUCINEX) 600 MG 12 hr tablet Take 1 tablet (600 mg total) by mouth 2 (two) times daily. 06/01/17  Yes Regalado, Belkys A, MD  ipratropium-albuterol (DUONEB) 0.5-2.5 (3) MG/3ML SOLN Take 3 mLs by nebulization every 6 (six) hours. 08/05/17  Yes Nita Sells, MD  losartan (COZAAR) 100 MG tablet Take 1 tablet (100 mg total) by mouth daily. 07/18/17  Yes [name], MD  metoprolol tartrate (LOPRESSOR) 100 MG tablet Take 1 tablet (100 mg total) by mouth 2 (two) times daily. 07/18/17  Yes Newlin, Charlane Ferretti, MD  mometasone-formoterol (DULERA) 200-5 MCG/ACT AERO Inhale 2 puffs into the lungs 2 (two) times daily. 07/09/17  Yes Young, Tarri Fuller D, MD  montelukast (SINGULAIR) 10 MG tablet Take 1 tablet (10 mg total) by mouth at bedtime. 07/18/17  Yes [name], MD  sertraline (ZOLOFT) 100 MG tablet Take 1 tablet (100 mg total) by mouth daily. 07/18/17  Yes [name], MD  spironolactone (ALDACTONE) 100 MG tablet Take 1 tablet (100 mg total) by mouth daily. 07/18/17  Yes [name], MD  furosemide (LASIX) 40 MG tablet Take 1 tablet (40 mg total) by mouth daily. 08/05/17 08/05/18  Nita Sells,  MD  nicotine (NICODERM CQ - DOSED IN MG/24 HR) 7 mg/24hr patch Place 1 patch (7 mg total) onto the skin daily. 08/05/17   Nita Sells, MD  nystatin (MYCOSTATIN) 100000 UNIT/ML suspension Take 5 mLs (500,000 Units total) by mouth 4 (four) times daily for 6 days. 08/05/17 08/11/17  Nita Sells, MD  pantoprazole (PROTONIX) 40 MG tablet Take 1 tablet (40 mg total) by mouth 2 (two) times daily. 08/05/17  Nita Sells, MD  predniSONE (DELTASONE) 20 MG tablet Take 1 tablet (20 mg total) by mouth daily with breakfast. 08/05/17   Nita Sells, MD    Allergies  Allergen Reactions  . Contrast Media [Iodinated Diagnostic Agents] Itching    Ct contrast    Physical Exam  Vitals  Blood pressure (!) 167/90, pulse (!) 112, resp. rate (!) 25, height 5\' 6"  (1.676 m), weight (!) 152.4 kg (336 lb), last menstrual period 07/07/2017, SpO2 97 %.   1. General well-developed, obese female, in moderate shortness of breath  2. Normal affect and insight, Not Suicidal or Homicidal, Awake Alert, Oriented X 3.  3. No F.N deficits, grossly.  4. Ears and Eyes appear Normal, moon facies.  5. Supple Neck, No JVD, No cervical lymphadenopathy appriciated, No Carotid Bruits.  6. Symmetrical Chest wall movement, 6 cathetered wheezing and rhonchi.  7. RRR, No Gallops, Rubs or Murmurs, No Parasternal Heave.  8. Positive Bowel Sounds, Abdomen Soft, Non tender, No organomegaly appriciated,No rebound -guarding or rigidity.  9.  No Cyanosis, Normal Skin Turgor, No Skin Rash or Bruise.  10. Good muscle tone,  joints appear normal , no effusions, Normal ROM.    Data Review  CBC Recent Labs  Lab 08/01/17 0540 08/06/17 2118  WBC 18.3* 21.7*  HGB 11.7* 13.3  HCT 37.4 41.9  PLT 417* 357  MCV 86.8 87.1  MCH 27.1 27.7  MCHC 31.3 31.7  RDW 17.2* 17.3*  LYMPHSABS 1.0 PENDING  MONOABS 1.1* PENDING  EOSABS 0.0 PENDING  BASOSABS 0.0 PENDING    ------------------------------------------------------------------------------------------------------------------  Chemistries  Recent Labs  Lab 08/01/17 0540 08/05/17 0606 08/06/17 2118  NA 133* 136 140  K 3.9 3.9 4.3  CL 99* 99* 101  CO2 23 27 25   GLUCOSE 329* 252* 237*  BUN 18 15 18   CREATININE 0.80 0.75 0.88  CALCIUM 8.1* 8.0* 8.7*  MG 2.2  --   --   AST  --   --  25  ALT  --   --  28  ALKPHOS  --   --  89  BILITOT  --   --  0.6   ------------------------------------------------------------------------------------------------------------------ estimated creatinine clearance is 124.3 mL/min (by C-G formula based on SCr of 0.88 mg/dL). ------------------------------------------------------------------------------------------------------------------ No results for input(s): TSH, T4TOTAL, T3FREE, THYROIDAB in the last 72 hours.  Invalid input(s): FREET3   Coagulation profile No results for input(s): INR, PROTIME in the last 168 hours. ------------------------------------------------------------------------------------------------------------------- No results for input(s): DDIMER in the last 72 hours. -------------------------------------------------------------------------------------------------------------------  Cardiac Enzymes No results for input(s): CKMB, TROPONINI, MYOGLOBIN in the last 168 hours.  Invalid input(s): CK ------------------------------------------------------------------------------------------------------------------ Invalid input(s): POCBNP   ---------------------------------------------------------------------------------------------------------------  Urinalysis    Component Value Date/Time   COLORURINE YELLOW 07/29/2017 0630   APPEARANCEUR CLEAR 07/29/2017 0630   LABSPEC 1.024 07/29/2017 0630   PHURINE 6.0 07/29/2017 0630   GLUCOSEU NEGATIVE 07/29/2017 0630   GLUCOSEU NEG mg/dL 10/28/2007 2049   HGBUR SMALL (A) 07/29/2017 0630    BILIRUBINUR NEGATIVE 07/29/2017 0630   KETONESUR NEGATIVE 07/29/2017 0630   PROTEINUR NEGATIVE 07/29/2017 0630   UROBILINOGEN 1.0 11/21/2014 0707   NITRITE NEGATIVE 07/29/2017 0630   LEUKOCYTESUR NEGATIVE 07/29/2017 0630    ----------------------------------------------------------------------------------------------------------------   Imaging results:   Dg Chest 2 View  Result Date: 08/06/2017 CLINICAL DATA:  Cough fever and chest pain EXAM: CHEST - 2 VIEW COMPARISON:  08/05/2017, 05/26/2017 FINDINGS: Mild diffuse bilateral ground-glass opacity most notable in the left upper lung and right base. No pleural effusion.  Stable cardiomediastinal silhouette. No pneumothorax. IMPRESSION: Suspected subtle ground-glass opacity within the left upper and right lower lungs, possible pneumonia. Electronically Signed   By: Donavan Foil M.D.   On: 08/06/2017 20:16   Dg Chest 2 View  Result Date: 07/28/2017 CLINICAL DATA:  Chest pain and shortness of breath for 1 day. History of COPD and pneumonia. EXAM: CHEST - 2 VIEW COMPARISON:  Chest radiograph August 07, 2017 FINDINGS: Cardiomediastinal silhouette is normal. No pleural effusions or focal consolidations. Bronchitic changes. Trachea projects midline and there is no pneumothorax. Soft tissue planes and included osseous structures are non-suspicious. Large body habitus. IMPRESSION: Bronchitic changes without focal consolidation. Electronically Signed   By: Elon Alas M.D.   On: 07/28/2017 22:22   Dg Chest Port 1 View  Result Date: 08/05/2017 CLINICAL DATA:  Shortness of breath. EXAM: PORTABLE CHEST 1 VIEW COMPARISON:  07/28/2017. FINDINGS: Mediastinum and hilar structures are normal. Lungs are clear. No pleural effusion or pneumothorax. No acute bony abnormality. IMPRESSION: No acute cardiac pulmonary disease. Electronically Signed   By: Marcello Moores  Register   On: 08/05/2017 07:39    My personal review of EKG: Rhythm NSR, sinus tach, LVH  Personally  reviewed Old Chart from 4/10  Assessment & Plan  1.  Multilobar pneumonia( HCAP) versus COP 2.  Advanced asthma 3.  Steroid induced hyperglycemia 4.  Hypertension 5.  Paroxysmal A. Fib 6.  Chronic diastolic congestive heart failure  Plan  Admit to telemetry IV antibiotics Continuous nebs IV steroids Consult pulmonary in a.m. ISS    DVT Prophylaxis Lovenox  AM Labs Ordered, also please review Full Orders    Code Status full  Disposition Plan: Home  Time spent in minutes : 43 minutes  Condition GUARDED   @SIGNATURE @

## 2017-08-06 NOTE — ED Triage Notes (Signed)
Patient c/o SOB and wheezing since d/c this morning from [facility].

## 2017-08-06 NOTE — Progress Notes (Signed)
  D/w Dr Verlon Au   - Spirometry - upper airway flow volume loop abnormality + - Walk test - normal on RA x 2 -  Recent Labs  Lab 08/05/17 1245  PHART 7.425  PCO2ART 46.8  PO2ART 71.1*  HCO3 30.2*  O2SAT 94.2     From ccm stand point can go if meets hospitalist criteria for dc Needs opd ENT consult - if not already done - high pretest prob for VCD  Future Appointments  Date Time Provider [place]  08/11/2017  2:00 PM [name], MD [facility] None  09/04/2017  4:00 PM Deneise Lever, MD [facility] None  10/16/2017  1:30 PM Deneise Lever, MD [facility] None    Dr. Brand Males, M.D., [facility].C.P Pulmonary and Critical Care Medicine Staff Physician, [place] Director - Interstitial Lung Disease  Program  Pulmonary [place] [facility] [place], [place], [address]  Pager: [phone], If no answer or between  15:00h - 7:00h: call [phone] Telephone: [phone]

## 2017-08-06 NOTE — ED Notes (Signed)
Bed: WA23 Expected date:  Expected time:  Means of arrival:  Comments: T1 

## 2017-08-06 NOTE — ED Notes (Signed)
I gave critical I Stat CG4 results to MD Ellender Hose

## 2017-08-06 NOTE — Progress Notes (Signed)
Patient reviewed-  BP (!) 159/65 (BP Location: Right Arm)   Pulse 75   Temp 99.1 F (37.3 C) (Oral)   Resp 20   Ht 5\' 6"  (1.676 m)   Wt (!) 155.7 kg (343 lb 3.2 oz) Comment: scale b  LMP 07/07/2017   SpO2 100%   BMI 55.39 kg/m  Awake alert some overnight-breakfast tray not touched Transmitted Upper airway sounds abd soft, no le edema  Possibly needs psych assessment underlying bipolar Ready for d/c  Verneita Griffes, MD [facility] [phone]

## 2017-08-06 NOTE — ED Notes (Signed)
Respiratory called for continuous neb 

## 2017-08-06 NOTE — ED Notes (Signed)
Patient transported to X-ray 

## 2017-08-06 NOTE — Hospital Discharge Follow-Up (Signed)
Met with the patient today. She stated that she thinks she is being discharged. Reminded her of her appointment at [facility] 08/11/17 @ 1000.  She stated that she understood and will be there.

## 2017-08-06 NOTE — Progress Notes (Signed)
Patient home medication returned from main  pharmacy page MD to make aware that medication that was discontinued is being returned. Patient teaching was given and she wants her medication!

## 2017-08-06 NOTE — ED Provider Notes (Addendum)
[place] DEPT Provider Note  CSN: [id number] Arrival date & time: 08/06/17 1940  Chief Complaint(s) Wheezing and Shortness of Breath  HPI April [name] is a 47 y.o. female with a history of COPD discharged today from [facility] after COPD exacerbation who presents to the emergency department with worsening shortness of breath.  Patient reports that shortness of breath was ongoing during admission.  Patient was discharged with steroids.  Chest x-ray yesterday did not reveal any evidence of pneumonia.  She reports that she has tried taking inhalers without relief.  Shortness of breath is exacerbated with coughing and exertion.  Reports nausea or vomiting. Endorses chest tightness.    Denies any other physical complaints.  HPI  Past Medical History Past Medical History:  Diagnosis Date  . Acanthosis nigricans   . Anxiety   . Arthritis    "knees" (04/28/2017)  . Asthma    Followed by Dr. Annamaria Boots (pulmonology); receives every other week omalizumab injections; has frequent exacerbations  . COPD (chronic obstructive pulmonary disease) ([place])    PFTs in 2002, FEV1/FVC 65, no post bronchodilater test done  . Depression   . GERD (gastroesophageal reflux disease)   . Headache(784.0)    "q couple days" (04/28/2017)  . Helicobacter pylori (H. pylori) infection   . Hypertension, essential   . Insomnia   . Menorrhagia   . Morbid obesity ([place])   . OSA on CPAP    Sleep study 2008 - mild OSA, not enough events to titrate CPAP; wears CPAP now/pt on 04/28/2017  . Pneumonia X 1  . Seasonal allergies   . Shortness of breath   . Tobacco user    Patient Active Problem List   Diagnosis Date Noted  . Acute on chronic respiratory failure with hypoxemia ([place]) 08/05/2017  . Unspecified atrial fibrillation ([place]) 07/29/2017  . Acute respiratory failure with hypoxia ([place]) 07/07/2017  . Hyperglycemia, drug-induced 06/06/2017  . New onset atrial fibrillation ([place])   . COPD  with acute exacerbation ([place]) 02/10/2017  . Dyspnea 02/04/2017  . Chronic diastolic CHF (congestive heart failure) ([place]) 01/17/2017  . SIRS (systemic inflammatory response syndrome) ([place]) 01/17/2017  . Asthma exacerbation 01/17/2017  . SOB (shortness of breath)   . Moderate persistent asthma with exacerbation 10/19/2016  . Normocytic anemia 10/02/2016  . Malignant hypertension due to primary aldosteronism ([place]) 05/05/2016  . Elevated hemoglobin A1c 05/05/2016  . GERD (gastroesophageal reflux disease) 08/30/2015  . Anxiety and depression 08/30/2015  . Seasonal allergic rhinitis 08/29/2013  . Leukocytosis 10/21/2012  . Tobacco abuse in remission 10/07/2012  . Knee pain, bilateral 04/25/2011  . Primary insomnia 03/14/2011  . OSA on CPAP 12/19/2010  . Hypokalemia 08/13/2010  . Cervical back pain with evidence of disc disease 04/08/2008  . Essential hypertension 07/31/2006  . Morbid obesity with body mass index of 50.0-59.9 in adult ([place]) 06/17/2006  . Major depressive disorder, recurrent episode ([place]) 04/10/2006   Home Medication(s) Prior to Admission medications   Medication Sig Start Date End Date Taking? Authorizing Provider  albuterol (PROVENTIL HFA;VENTOLIN HFA) 108 (90 Base) MCG/ACT inhaler Inhale 2 puffs into the lungs every 4 (four) hours as needed for wheezing or shortness of breath. 07/18/17  Yes Newlin, Enobong, MD  apixaban (ELIQUIS) 5 MG TABS tablet Take 1 tablet (5 mg total) by mouth 2 (two) times daily. 07/18/17  Yes [name], MD  budesonide-formoterol (SYMBICORT) 160-4.5 MCG/ACT inhaler Inhale 2 puffs into the lungs 2 (two) times daily. 07/14/17  Yes Young, [employer]  D, MD  busPIRone (BUSPAR) 7.5 MG tablet Take 1 tablet (7.5 mg total) 2 (two) times daily by mouth. 03/12/17  Yes [name], MD  butalbital-acetaminophen-caffeine (FIORICET, ESGIC) 50-325-40 MG tablet Take 1-2 tablets by mouth every 6 (six) hours as needed for headache. 12/19/16 12/19/17 Yes Joy, Shawn C,  PA-C  diltiazem (CARDIZEM CD) 360 MG 24 hr capsule Take 1 capsule (360 mg total) by mouth daily. 06/25/17  Yes [name], MD  famotidine (PEPCID) 20 MG tablet Take 1 tablet (20 mg total) by mouth 2 (two) times daily. 03/29/17  Yes Ward, Ozella Almond, PA-C  fluticasone (FLONASE) 50 MCG/ACT nasal spray Place 2 sprays into both nostrils daily. 12/13/16  Yes Rai, Ripudeep K, MD  glipiZIDE (GLUCOTROL) 5 MG tablet Take 0.5 tablets (2.5 mg total) by mouth 2 (two) times daily before a meal. 07/18/17  Yes Newlin, Enobong, MD  guaiFENesin (MUCINEX) 600 MG 12 hr tablet Take 1 tablet (600 mg total) by mouth 2 (two) times daily. 06/01/17  Yes Regalado, Belkys A, MD  ipratropium-albuterol (DUONEB) 0.5-2.5 (3) MG/3ML SOLN Take 3 mLs by nebulization every 6 (six) hours. 08/05/17  Yes Nita Sells, MD  losartan (COZAAR) 100 MG tablet Take 1 tablet (100 mg total) by mouth daily. 07/18/17  Yes [name], MD  metoprolol tartrate (LOPRESSOR) 100 MG tablet Take 1 tablet (100 mg total) by mouth 2 (two) times daily. 07/18/17  Yes Newlin, Charlane Ferretti, MD  mometasone-formoterol (DULERA) 200-5 MCG/ACT AERO Inhale 2 puffs into the lungs 2 (two) times daily. 07/09/17  Yes Young, Tarri Fuller D, MD  montelukast (SINGULAIR) 10 MG tablet Take 1 tablet (10 mg total) by mouth at bedtime. 07/18/17  Yes [name], MD  sertraline (ZOLOFT) 100 MG tablet Take 1 tablet (100 mg total) by mouth daily. 07/18/17  Yes [name], MD  spironolactone (ALDACTONE) 100 MG tablet Take 1 tablet (100 mg total) by mouth daily. 07/18/17  Yes [name], MD  furosemide (LASIX) 40 MG tablet Take 1 tablet (40 mg total) by mouth daily. 08/05/17 08/05/18  Nita Sells, MD  nicotine (NICODERM CQ - DOSED IN MG/24 HR) 7 mg/24hr patch Place 1 patch (7 mg total) onto the skin daily. 08/05/17   Nita Sells, MD  nystatin (MYCOSTATIN) 100000 UNIT/ML suspension Take 5 mLs (500,000 Units total) by mouth 4 (four) times daily for 6 days. 08/05/17  08/11/17  Nita Sells, MD  pantoprazole (PROTONIX) 40 MG tablet Take 1 tablet (40 mg total) by mouth 2 (two) times daily. 08/05/17   Nita Sells, MD  predniSONE (DELTASONE) 20 MG tablet Take 1 tablet (20 mg total) by mouth daily with breakfast. 08/05/17   Nita Sells, MD                                                                                                                                    Past Surgical History Past Surgical History:  Procedure Laterality Date  .  CARDIOVERSION N/A 05/30/2017   Procedure: CARDIOVERSION;  Surgeon: Sanda Klein, MD;  Location: [facility];  Service: Cardiovascular;  Laterality: N/A;  . REDUCTION MAMMAPLASTY Bilateral 09/2011  . TUBAL LIGATION  1996   bilateral   Family History Family History  Problem Relation Age of Onset  . Hypertension Mother   . Asthma Daughter   . Cancer Paternal Aunt   . Asthma Maternal Grandmother     Social History Social History   Tobacco Use  . Smoking status: Former Smoker    Packs/day: 0.50    Years: 26.00    Pack years: 13.00    Types: Cigarettes    Last attempt to quit: 09/12/2014    Years since quitting: 2.9  . Smokeless tobacco: Never Used  Substance Use Topics  . Alcohol use: No  . Drug use: No   Allergies Contrast media [iodinated diagnostic agents]  Review of Systems Review of Systems All other systems are reviewed and are negative for acute change except as noted in the HPI  Physical Exam Vital Signs  I have reviewed the triage vital signs BP (!) 184/91   Pulse (!) 108   Resp (!) 23   Ht 5\' 6"  (1.676 m)   Wt (!) 152.4 kg (336 lb)   LMP 07/07/2017   SpO2 100%   BMI 54.23 kg/m   Physical Exam  Constitutional: She is oriented to person, place, and time. She appears well-developed and well-nourished. No distress.  HENT:  Head: Normocephalic and atraumatic.  Nose: Nose normal.  Eyes: Pupils are equal, round, and reactive to light. Conjunctivae and EOM are  normal. Right eye exhibits no discharge. Left eye exhibits no discharge. No scleral icterus.  Neck: Normal range of motion. Neck supple.  Cardiovascular: Normal rate and regular rhythm. Exam reveals no gallop and no friction rub.  No murmur heard. Pulmonary/Chest: Accessory muscle usage present. No stridor. Tachypnea noted. No respiratory distress. She has wheezes ( Expiratory and expiratory wheezes). She has rhonchi. She has no rales.  Abdominal: Soft. She exhibits no distension. There is no tenderness.  Musculoskeletal: She exhibits no edema or tenderness.  Neurological: She is alert and oriented to person, place, and time.  Skin: Skin is warm and dry. No rash noted. She is not diaphoretic. No erythema.  Psychiatric: She has a normal mood and affect.  Vitals reviewed.   ED Results and Treatments Labs (all labs ordered are listed, but only abnormal results are displayed) Labs Reviewed  COMPREHENSIVE METABOLIC PANEL - Abnormal; Notable for the following components:      Result Value   Glucose, Bld 237 (*)    Calcium 8.7 (*)    Albumin 3.2 (*)    All other components within normal limits  CBC WITH DIFFERENTIAL/PLATELET - Abnormal; Notable for the following components:   WBC 21.7 (*)    RDW 17.3 (*)    All other components within normal limits  I-STAT CG4 LACTIC ACID, ED - Abnormal; Notable for the following components:   Lactic Acid, Venous 3.88 (*)    All other components within normal limits  CULTURE, BLOOD (ROUTINE X 2)  CULTURE, BLOOD (ROUTINE X 2)  URINALYSIS, ROUTINE W REFLEX MICROSCOPIC  I-STAT CG4 LACTIC ACID, ED  EKG  EKG Interpretation  Date/Time:  Wednesday August 06 2017 19:51:07 EDT Ventricular Rate:  103 PR Interval:    QRS Duration: 80 QT Interval:  331 QTC Calculation: 434 R Axis:   -2 Text Interpretation:  Sinus tachycardia LVH with  secondary repolarization abnormality Anterior Q waves, possibly due to LVH No significant change since last tracing Confirmed by Dorie Rank [phone]) on 08/06/2017 7:56:03 PM      Radiology Dg Chest 2 View  Result Date: 08/06/2017 CLINICAL DATA:  Cough fever and chest pain EXAM: CHEST - 2 VIEW COMPARISON:  08/05/2017, 05/26/2017 FINDINGS: Mild diffuse bilateral ground-glass opacity most notable in the left upper lung and right base. No pleural effusion. Stable cardiomediastinal silhouette. No pneumothorax. IMPRESSION: Suspected subtle ground-glass opacity within the left upper and right lower lungs, possible pneumonia. Electronically Signed   By: Donavan Foil M.D.   On: 08/06/2017 20:16   Pertinent labs & imaging results that were available during my care of the patient were reviewed by me and considered in my medical decision making (see chart for details).  Medications Ordered in ED Medications  albuterol (PROVENTIL,VENTOLIN) solution continuous neb (0 mg/hr Nebulization Stopped 08/06/17 2220)  albuterol (PROVENTIL,VENTOLIN) solution continuous neb (has no administration in time range)  albuterol (PROVENTIL) (2.5 MG/3ML) 0.083% nebulizer solution 5 mg (5 mg Nebulization Given 08/06/17 2043)  ceFEPIme (MAXIPIME) 2 g in sodium chloride 0.9 % 100 mL IVPB (0 g Intravenous Stopped 08/06/17 2154)  vancomycin (VANCOCIN) IVPB 1000 mg/200 mL premix (1,000 mg Intravenous New Bag/Given 08/06/17 2217)  ipratropium (ATROVENT) nebulizer solution 0.5 mg (0.5 mg Nebulization Given 08/06/17 2108)  magnesium sulfate IVPB 2 g 50 mL (0 g Intravenous Stopped 08/06/17 2225)  sodium chloride 0.9 % bolus 1,000 mL (1,000 mLs Intravenous New Bag/Given 08/06/17 2141)                                                                                                                                    Procedures Procedures CRITICAL CARE Performed by: Grayce Sessions Cardama Total critical care time: 35 minutes Critical care time  was exclusive of separately billable procedures and treating other patients. Critical care was necessary to treat or prevent imminent or life-threatening deterioration. Critical care was time spent personally by me on the following activities: development of treatment plan with patient and/or surrogate as well as nursing, discussions with consultants, evaluation of patient's response to treatment, examination of patient, obtaining history from patient or surrogate, ordering and performing treatments and interventions, ordering and review of laboratory studies, ordering and review of radiographic studies, pulse oximetry and re-evaluation of patient's condition.   (including critical care time)  Medical Decision Making / ED Course I have reviewed the nursing notes for this encounter and the patient's prior records (if available in EHR or on provided paperwork).    Patient presented with worsening shortness of breath and productive cough.  Endorses chills but no overt  fevers.  On exam patient with diffuse wheezing and rhonchi.  And respiratory distress with tachypnea and accessory muscle use.  Patient placed on a continuous nebulizer and provided with magnesium.    Given her recent admission, chest x-ray was obtained revealing evidence of bilateral pneumonia.  Lactic acid at 3.  Code sepsis initiated and patient started on empiric antibiotics for hospital-acquired pneumonia.  IV fluids ordered.  Patient did not require 30 cc/kg of IV fluids.   Case discussed with admission for continued management.  Final Clinical Impression(s) / ED Diagnoses Final diagnoses:  Hospital acquired PNA  COPD exacerbation ([place])  Sepsis, due to unspecified organism [facility])      This chart was dictated using voice recognition software.  Despite best efforts to proofread,  errors can occur which can change the documentation meaning.       Fatima Blank, MD 08/07/17 [phone]

## 2017-08-07 DIAGNOSIS — K219 Gastro-esophageal reflux disease without esophagitis: Secondary | ICD-10-CM

## 2017-08-07 DIAGNOSIS — I4891 Unspecified atrial fibrillation: Secondary | ICD-10-CM

## 2017-08-07 DIAGNOSIS — J4541 Moderate persistent asthma with (acute) exacerbation: Secondary | ICD-10-CM

## 2017-08-07 DIAGNOSIS — Z6841 Body Mass Index (BMI) 40.0 and over, adult: Secondary | ICD-10-CM

## 2017-08-07 DIAGNOSIS — I5032 Chronic diastolic (congestive) heart failure: Secondary | ICD-10-CM

## 2017-08-07 DIAGNOSIS — Z9989 Dependence on other enabling machines and devices: Secondary | ICD-10-CM

## 2017-08-07 DIAGNOSIS — G4733 Obstructive sleep apnea (adult) (pediatric): Secondary | ICD-10-CM

## 2017-08-07 DIAGNOSIS — Z720 Tobacco use: Secondary | ICD-10-CM

## 2017-08-07 DIAGNOSIS — D72829 Elevated white blood cell count, unspecified: Secondary | ICD-10-CM

## 2017-08-07 LAB — BASIC METABOLIC PANEL
ANION GAP: 16 — AB (ref 5–15)
BUN: 15 mg/dL (ref 6–20)
CALCIUM: 8 mg/dL — AB (ref 8.9–10.3)
CO2: 22 mmol/L (ref 22–32)
CREATININE: 0.87 mg/dL (ref 0.44–1.00)
Chloride: 98 mmol/L — ABNORMAL LOW (ref 101–111)
Glucose, Bld: 303 mg/dL — ABNORMAL HIGH (ref 65–99)
Potassium: 3.7 mmol/L (ref 3.5–5.1)
SODIUM: 136 mmol/L (ref 135–145)

## 2017-08-07 LAB — CBC
HCT: 38.8 % (ref 36.0–46.0)
Hemoglobin: 11.9 g/dL — ABNORMAL LOW (ref 12.0–15.0)
MCH: 27 pg (ref 26.0–34.0)
MCHC: 30.7 g/dL (ref 30.0–36.0)
MCV: 88.2 fL (ref 78.0–100.0)
PLATELETS: 308 10*3/uL (ref 150–400)
RBC: 4.4 MIL/uL (ref 3.87–5.11)
RDW: 17.5 % — ABNORMAL HIGH (ref 11.5–15.5)
WBC: 22.3 10*3/uL — AB (ref 4.0–10.5)

## 2017-08-07 LAB — CBC WITH DIFFERENTIAL/PLATELET
BASOS ABS: 0 10*3/uL (ref 0.0–0.1)
Band Neutrophils: 0 %
Basophils Relative: 0 %
EOS ABS: 0 10*3/uL (ref 0.0–0.7)
EOS PCT: 0 %
HCT: 41.9 % (ref 36.0–46.0)
Hemoglobin: 13.3 g/dL (ref 12.0–15.0)
Lymphocytes Relative: 8 %
Lymphs Abs: 1.8 10*3/uL (ref 0.7–4.0)
MCH: 27.7 pg (ref 26.0–34.0)
MCHC: 31.7 g/dL (ref 30.0–36.0)
MCV: 87.1 fL (ref 78.0–100.0)
MONO ABS: 1.5 10*3/uL — AB (ref 0.1–1.0)
Monocytes Relative: 6 %
NEUTROS PCT: 86 %
Neutro Abs: 20 10*3/uL — ABNORMAL HIGH (ref 1.7–7.7)
PLATELETS: 357 10*3/uL (ref 150–400)
RBC: 4.81 MIL/uL (ref 3.87–5.11)
RDW: 17.3 % — AB (ref 11.5–15.5)
WBC: 21.7 10*3/uL — AB (ref 4.0–10.5)

## 2017-08-07 LAB — GLUCOSE, CAPILLARY
GLUCOSE-CAPILLARY: 311 mg/dL — AB (ref 65–99)
GLUCOSE-CAPILLARY: 270 mg/dL — AB (ref 65–99)
GLUCOSE-CAPILLARY: 316 mg/dL — AB (ref 65–99)
Glucose-Capillary: 351 mg/dL — ABNORMAL HIGH (ref 65–99)

## 2017-08-07 MED ORDER — VANCOMYCIN HCL IN DEXTROSE 1-5 GM/200ML-% IV SOLN
1000.0000 mg | Freq: Two times a day (BID) | INTRAVENOUS | Status: DC
  Administered 2017-08-07 – 2017-08-08 (×3): 1000 mg via INTRAVENOUS
  Filled 2017-08-07 (×4): qty 200

## 2017-08-07 MED ORDER — MOMETASONE FURO-FORMOTEROL FUM 200-5 MCG/ACT IN AERO
2.0000 | INHALATION_SPRAY | Freq: Two times a day (BID) | RESPIRATORY_TRACT | Status: DC
  Administered 2017-08-07 – 2017-08-08 (×4): 2 via RESPIRATORY_TRACT
  Filled 2017-08-07: qty 8.8

## 2017-08-07 MED ORDER — ALBUTEROL SULFATE HFA 108 (90 BASE) MCG/ACT IN AERS
2.0000 | INHALATION_SPRAY | RESPIRATORY_TRACT | Status: DC | PRN
  Filled 2017-08-07: qty 6.7

## 2017-08-07 MED ORDER — SODIUM CHLORIDE 0.9 % IV SOLN
1.0000 g | Freq: Three times a day (TID) | INTRAVENOUS | Status: DC
  Administered 2017-08-07 – 2017-08-10 (×10): 1 g via INTRAVENOUS
  Filled 2017-08-07 (×11): qty 1

## 2017-08-07 MED ORDER — NICOTINE 7 MG/24HR TD PT24
7.0000 mg | MEDICATED_PATCH | Freq: Every day | TRANSDERMAL | Status: DC
  Administered 2017-08-07: 7 mg via TRANSDERMAL
  Filled 2017-08-07: qty 1

## 2017-08-07 MED ORDER — PANTOPRAZOLE SODIUM 40 MG PO TBEC
40.0000 mg | DELAYED_RELEASE_TABLET | Freq: Two times a day (BID) | ORAL | Status: DC
  Administered 2017-08-07 – 2017-08-10 (×8): 40 mg via ORAL
  Filled 2017-08-07 (×8): qty 1

## 2017-08-07 MED ORDER — ONDANSETRON HCL 4 MG/2ML IJ SOLN: 4.0000 mg | Freq: Four times a day (QID) | INTRAMUSCULAR | Status: DC | PRN

## 2017-08-07 MED ORDER — GLIPIZIDE 5 MG PO TABS
2.5000 mg | ORAL_TABLET | Freq: Two times a day (BID) | ORAL | Status: DC
  Administered 2017-08-07 – 2017-08-08 (×3): 2.5 mg via ORAL
  Filled 2017-08-07 (×3): qty 1

## 2017-08-07 MED ORDER — ZOLPIDEM TARTRATE 5 MG PO TABS
5.0000 mg | ORAL_TABLET | Freq: Every evening | ORAL | Status: DC | PRN
  Administered 2017-08-07 – 2017-08-10 (×4): 5 mg via ORAL
  Filled 2017-08-07 (×4): qty 1

## 2017-08-07 MED ORDER — DILTIAZEM HCL ER COATED BEADS 180 MG PO CP24
360.0000 mg | ORAL_CAPSULE | Freq: Every day | ORAL | Status: DC
  Administered 2017-08-07: 360 mg via ORAL
  Filled 2017-08-07: qty 2

## 2017-08-07 MED ORDER — GUAIFENESIN ER 600 MG PO TB12
600.0000 mg | ORAL_TABLET | Freq: Two times a day (BID) | ORAL | Status: DC
  Administered 2017-08-07 – 2017-08-10 (×8): 600 mg via ORAL
  Filled 2017-08-07 (×9): qty 1

## 2017-08-07 MED ORDER — INSULIN ASPART 100 UNIT/ML ~~LOC~~ SOLN
0.0000 [IU] | Freq: Three times a day (TID) | SUBCUTANEOUS | Status: DC
  Administered 2017-08-07: 15 [IU] via SUBCUTANEOUS
  Administered 2017-08-07: 11 [IU] via SUBCUTANEOUS
  Administered 2017-08-07: 8 [IU] via SUBCUTANEOUS
  Administered 2017-08-08: 15 [IU] via SUBCUTANEOUS

## 2017-08-07 MED ORDER — FLUTICASONE PROPIONATE 50 MCG/ACT NA SUSP
2.0000 | Freq: Every day | NASAL | Status: DC
  Administered 2017-08-10 – 2017-08-13 (×3): 2 via NASAL
  Filled 2017-08-07: qty 16

## 2017-08-07 MED ORDER — MONTELUKAST SODIUM 10 MG PO TABS
10.0000 mg | ORAL_TABLET | Freq: Every day | ORAL | Status: DC
  Administered 2017-08-07 – 2017-08-17 (×11): 10 mg via ORAL
  Filled 2017-08-07 (×11): qty 1

## 2017-08-07 MED ORDER — MOMETASONE FURO-FORMOTEROL FUM 200-5 MCG/ACT IN AERO
2.0000 | INHALATION_SPRAY | Freq: Two times a day (BID) | RESPIRATORY_TRACT | Status: DC
  Filled 2017-08-07: qty 8.8

## 2017-08-07 MED ORDER — IPRATROPIUM-ALBUTEROL 0.5-2.5 (3) MG/3ML IN SOLN
3.0000 mL | RESPIRATORY_TRACT | Status: DC | PRN
  Administered 2017-08-08 (×4): 3 mL via RESPIRATORY_TRACT
  Filled 2017-08-07 (×5): qty 3

## 2017-08-07 MED ORDER — SPIRONOLACTONE 25 MG PO TABS
100.0000 mg | ORAL_TABLET | Freq: Every day | ORAL | Status: DC
  Administered 2017-08-07 – 2017-08-11 (×5): 100 mg via ORAL
  Filled 2017-08-07 (×5): qty 4

## 2017-08-07 MED ORDER — IPRATROPIUM-ALBUTEROL 0.5-2.5 (3) MG/3ML IN SOLN
3.0000 mL | Freq: Four times a day (QID) | RESPIRATORY_TRACT | Status: DC
  Administered 2017-08-07: 3 mL via RESPIRATORY_TRACT
  Filled 2017-08-07: qty 3

## 2017-08-07 MED ORDER — NYSTATIN 100000 UNIT/ML MT SUSP
5.0000 mL | Freq: Four times a day (QID) | OROMUCOSAL | Status: DC
  Administered 2017-08-07 – 2017-08-18 (×42): 500000 [IU] via ORAL
  Filled 2017-08-07 (×43): qty 5

## 2017-08-07 MED ORDER — NICOTINE 7 MG/24HR TD PT24
7.0000 mg | MEDICATED_PATCH | Freq: Every day | TRANSDERMAL | Status: DC
  Filled 2017-08-07: qty 1

## 2017-08-07 MED ORDER — LOSARTAN POTASSIUM 50 MG PO TABS
100.0000 mg | ORAL_TABLET | Freq: Every day | ORAL | Status: DC
  Administered 2017-08-07: 100 mg via ORAL
  Filled 2017-08-07: qty 2

## 2017-08-07 MED ORDER — INSULIN ASPART 100 UNIT/ML ~~LOC~~ SOLN
0.0000 [IU] | Freq: Every day | SUBCUTANEOUS | Status: DC
  Administered 2017-08-07: 4 [IU] via SUBCUTANEOUS
  Administered 2017-08-09 – 2017-08-10 (×2): 2 [IU] via SUBCUTANEOUS

## 2017-08-07 MED ORDER — INSULIN ASPART 100 UNIT/ML ~~LOC~~ SOLN
3.0000 [IU] | Freq: Three times a day (TID) | SUBCUTANEOUS | Status: DC
  Administered 2017-08-07 – 2017-08-08 (×2): 3 [IU] via SUBCUTANEOUS

## 2017-08-07 MED ORDER — METHYLPREDNISOLONE SODIUM SUCC 125 MG IJ SOLR
60.0000 mg | Freq: Four times a day (QID) | INTRAMUSCULAR | Status: DC
  Administered 2017-08-07 – 2017-08-10 (×13): 60 mg via INTRAVENOUS
  Filled 2017-08-07 (×13): qty 2

## 2017-08-07 MED ORDER — SERTRALINE HCL 100 MG PO TABS
100.0000 mg | ORAL_TABLET | Freq: Every day | ORAL | Status: DC
  Administered 2017-08-07 – 2017-08-17 (×11): 100 mg via ORAL
  Filled 2017-08-07 (×11): qty 1

## 2017-08-07 MED ORDER — FUROSEMIDE 40 MG PO TABS
40.0000 mg | ORAL_TABLET | Freq: Every day | ORAL | Status: DC
  Administered 2017-08-07 – 2017-08-08 (×2): 40 mg via ORAL
  Filled 2017-08-07 (×2): qty 1

## 2017-08-07 MED ORDER — APIXABAN 5 MG PO TABS
5.0000 mg | ORAL_TABLET | Freq: Two times a day (BID) | ORAL | Status: DC
  Administered 2017-08-07 – 2017-08-17 (×21): 5 mg via ORAL
  Filled 2017-08-07 (×21): qty 1

## 2017-08-07 MED ORDER — ONDANSETRON HCL 4 MG PO TABS: 4.0000 mg | ORAL_TABLET | Freq: Four times a day (QID) | ORAL | Status: DC | PRN

## 2017-08-07 MED ORDER — METOPROLOL TARTRATE 50 MG PO TABS
100.0000 mg | ORAL_TABLET | Freq: Two times a day (BID) | ORAL | Status: DC
  Administered 2017-08-07 (×2): 100 mg via ORAL
  Filled 2017-08-07 (×2): qty 2

## 2017-08-07 MED ORDER — ENOXAPARIN SODIUM 40 MG/0.4ML ~~LOC~~ SOLN: 40.0000 mg | SUBCUTANEOUS | Status: DC

## 2017-08-07 MED ORDER — BUSPIRONE HCL 5 MG PO TABS
7.5000 mg | ORAL_TABLET | Freq: Two times a day (BID) | ORAL | Status: DC
  Administered 2017-08-07 – 2017-08-13 (×13): 7.5 mg via ORAL
  Filled 2017-08-07 (×13): qty 2

## 2017-08-07 MED ORDER — IPRATROPIUM-ALBUTEROL 0.5-2.5 (3) MG/3ML IN SOLN
3.0000 mL | RESPIRATORY_TRACT | Status: DC
  Administered 2017-08-07 – 2017-08-18 (×66): 3 mL via RESPIRATORY_TRACT
  Filled 2017-08-07 (×64): qty 3

## 2017-08-07 MED ORDER — TRAMADOL HCL 50 MG PO TABS
50.0000 mg | ORAL_TABLET | Freq: Four times a day (QID) | ORAL | Status: DC | PRN
  Administered 2017-08-07 – 2017-08-10 (×5): 50 mg via ORAL
  Filled 2017-08-07 (×5): qty 1

## 2017-08-07 NOTE — Progress Notes (Signed)
Inpatient Diabetes Program Recommendations  AACE/ADA: New Consensus Statement on Inpatient Glycemic Control (2015)  Target Ranges:  Prepandial:   less than 140 mg/dL      Peak postprandial:   less than 180 mg/dL (1-2 hours)      Critically ill patients:  140 - 180 mg/dL    Results for April [name], April [name] (MRN [id number]) as of 08/07/2017 14:23  Ref. Range 08/07/2017 08:44 08/07/2017 11:24  Glucose-Capillary Latest Ref Range: 65 - 99 mg/dL 311 (H)  11 units NOVOLOG  351 (H)  15 units NOVOLOG     Admit with: Multilobar pneumonia( HCAP) versus COPD   History: DM  Home DM Meds: Glipizide 2.5 mg BID  Current Insulin Orders: Glipizide 2.5 mg BID      Novolog Moderate Correction Scale/ SSI (0-15 units) TID AC + HS      Diagnosed with Diabetes (likely due to chronic steroid use and Obesity) back in early March during hospitalization.  Was discharged home with Rx for Lantus 15 units QHS.  Met with Dr. Margarita Rana at [facility] on 07/18/17.  At that visit, Lantus was stopped and patient was started on Glipizide 2.5 mg BID.  Is supposed to be testing CBGs 4 times per day.  Admitted on 07/29/17 for SOB.  Discharged on 08/06/17 (yesterday) and came right back to ED last evening with continued SOB.     MD- Note patient getting Solumedrol 60 mg Q6 hours.  During last admission (just a day ago), patient was getting fairly large dose of Lantus (48 units daily).   Please consider starting Lantus 30 units QHS tonight (0.2 units/kg dosing based on weight of 156 kg)  May also want to increase Novolog SSI to Resistant scale (0-20 units) TID AC + HS     --Will follow patient during hospitalization--  Wyn Quaker RN, MSN, CDE Diabetes Coordinator Inpatient Glycemic Control Team Team Pager: [phone] (8a-5p)

## 2017-08-07 NOTE — Progress Notes (Signed)
Pharmacy Antibiotic Note  April [name] is a 47 y.o. female admitted on 08/06/2017 with pneumonia and sepsis.  Pharmacy has been consulted for Vancomycin, cefepime dosing.  Plan: Cefepime 2gm iv x1, then 1gm iv q8hr  Vancomycin 1gm iv x1, then 1.5gm x1 for 2.5gm total, then 1gm iv q12hr  Goal AUC = 400 - 500 for all indications, except meningitis (goal AUC > 500 and Cmin 15-20 mcg/mL)   Height: 5\' 6"  (167.6 cm) Weight: (!) 344 lb 6.4 oz (156.2 kg) IBW/kg (Calculated) : 59.3  Temp (24hrs), Avg:98.7 F (37.1 C), Min:98.3 F (36.8 C), Max:99.1 F (37.3 C)  Recent Labs  Lab 08/01/17 0540 08/05/17 0606 08/06/17 2118 08/06/17 2128 08/06/17 2346  WBC 18.3*  --  21.7*  --   --   CREATININE 0.80 0.75 0.88  --   --   LATICACIDVEN  --   --   --  3.88* 3.38*    Estimated Creatinine Clearance: 126.3 mL/min (by C-G formula based on SCr of 0.88 mg/dL).    Allergies  Allergen Reactions  . Contrast Media [Iodinated Diagnostic Agents] Itching    Ct contrast    Antimicrobials this admission: Vancomycin 08/06/2017 >> Cefepime 08/06/2017 >>   Dose adjustments this admission: -  Microbiology results: -  Thank you for allowing pharmacy to be a part of this patient's care.  Nani Skillern [place]ford 08/07/2017 5:43 AM

## 2017-08-07 NOTE — Progress Notes (Signed)
ED TO INPATIENT HANDOFF REPORT  Name/Age/Gender April [name] 47 y.o. female  Code Status    Code Status Orders  (From admission, onward)        Start     Ordered   08/07/17 0405  Full code  Continuous     08/07/17 0405    Code Status History    Date Active Date Inactive Code Status Order ID Comments User Context   07/29/2017 0627 08/06/2017 1524 Full Code [id number]  April Patience, MD ED   07/07/2017 0717 07/12/2017 2008 Full Code [id number]  Rondel Jumbo, PA-C ED   06/06/2017 0836 06/08/2017 2107 Full Code [id number]  April Hey, MD ED   05/26/2017 0617 06/01/2017 1653 Full Code [id number]  April Costa, MD ED   04/27/2017 1623 04/30/2017 1742 Full Code [id number]  Rondel Jumbo, PA-C ED   02/15/2017 0226 02/16/2017 1806 Full Code [id number]  April Edinger, MD Inpatient   02/11/2017 0038 02/14/2017 0537 Full Code [id number]  April Patience, MD ED   01/27/2017 1024 02/01/2017 1751 Full Code [id number]  April Gravel, MD ED   01/17/2017 0514 01/17/2017 2021 Full Code [id number]  April Costa, MD ED   01/01/2017 0818 01/02/2017 1920 Full Code [id number]  April Gunning, NP ED   12/21/2016 2015 12/22/2016 1723 Full Code [id number]  April Bulls, MD ED   12/10/2016 1852 12/13/2016 1229 Full Code [id number]  April Shell, MD ED   11/13/2016 2017 11/15/2016 1758 Full Code [id number]  April Morton, MD ED   10/19/2016 0204 10/20/2016 1755 Full Code [id number]  April Morton, MD ED   10/02/2016 0553 10/04/2016 2204 Full Code [id number]  April Dada, MD Inpatient   06/22/2016 2104 06/25/2016 1707 Full Code [id number]  April Quill, DO ED   05/05/2016 2044 05/07/2016 1601 Full Code [id number]  April Bongo, MD Inpatient   01/30/2016 1937 01/31/2016 1801 Full Code [id number]  April Pile, MD Inpatient   12/19/2015 0640 12/21/2015 1851 Full Code [id number]  April Pile, MD Inpatient   11/27/2015 0019 11/27/2015 2031 Full Code [id number]  April Herrlich, MD ED   11/10/2015 1408  11/12/2015 1641 Full Code [id number]  April Loll, MD ED   10/24/2015 0933 10/26/2015 1626 Full Code [id number]  April Bales, MD ED   08/22/2015 2052 08/24/2015 1700 Full Code [id number]  April Mire, MD ED   08/14/2015 1624 08/15/2015 1644 Full Code [id number]  April Chance, MD ED   07/12/2015 1429 07/13/2015 0015 Full Code [id number]  April Lis, MD Inpatient   05/23/2015 1650 05/24/2015 1819 Full Code [id number]  April Roys, MD ED   03/13/2015 1143 03/14/2015 1742 Full Code [id number]  April Brand, MD Inpatient   11/03/2014 0444 11/03/2014 1823 Full Code [id number]  April Nims, MD Inpatient   09/15/2014 1326 09/16/2014 1658 Full Code [id number]  April [name] Inpatient   09/08/2014 1401 09/09/2014 1752 Full Code [id number]  April Sox, MD Inpatient   03/27/2014 2111 03/28/2014 1754 Full Code [id number]  April Roys, MD Inpatient   11/23/2013 1717 11/24/2013 0009 Full Code [id number]  Clinton Gallant, MD Inpatient   06/06/2013 2326 06/07/2013 1912 Full Code [id number]  April Mates, MD Inpatient   11/02/2012 1854 11/03/2012 1909 Full Code [id number]  April Boyer, MD Inpatient   10/21/2012 1030 10/23/2012 1730 Full Code [id number]  April Fountain, MD Inpatient   10/06/2012 2349 10/07/2012 0657 Full Code [id number]  [name], Verlon Au  K, MD Inpatient   05/22/2012 2200 05/23/2012 1627 Full Code [id number]  April Glass, RN Inpatient   02/27/2012 0219 02/27/2012 1728 Full Code [id number]  April Fountain, MD Inpatient      Home/SNF/Other Home  Chief Complaint Asthma; SOB   Level of Care/Admitting Diagnosis ED Disposition    ED Disposition Condition [place]: [facility] [100102]  Level of Care: Telemetry [5]  Admit to tele based on following criteria: Other see comments  Comments: Sepsis  Diagnosis: HCAP (healthcare-associated pneumonia) [536144]  Admitting Physician: [name]  Attending Physician: [name], [place] Marshal.Browner   Estimated length of stay: past midnight tomorrow  Certification:: I certify this patient will need inpatient services for at least 2 midnights  PT Class (Do Not Modify): Inpatient [101]  PT Acc Code (Do Not Modify): Private [1]       Medical History Past Medical History:  Diagnosis Date  . Acanthosis nigricans   . Anxiety   . Arthritis    "knees" (04/28/2017)  . Asthma    Followed by Dr. Annamaria Boots (pulmonology); receives every other week omalizumab injections; has frequent exacerbations  . COPD (chronic obstructive pulmonary disease) ([place])    PFTs in 2002, FEV1/FVC 65, no post bronchodilater test done  . Depression   . GERD (gastroesophageal reflux disease)   . Headache(784.0)    "q couple days" (04/28/2017)  . Helicobacter pylori (H. pylori) infection   . Hypertension, essential   . Insomnia   . Menorrhagia   . Morbid obesity ([place])   . OSA on CPAP    Sleep study 2008 - mild OSA, not enough events to titrate CPAP; wears CPAP now/pt on 04/28/2017  . Pneumonia X 1  . Seasonal allergies   . Shortness of breath   . Tobacco user     Allergies Allergies  Allergen Reactions  . Contrast Media [Iodinated Diagnostic Agents] Itching    Ct contrast    IV Location/Drains/Wounds Patient Lines/Drains/Airways Status   Active Line/Drains/Airways    Name:   Placement date:   Placement time:   Site:   Days:   Peripheral IV 08/06/17 Right;Posterior Forearm   08/06/17    2121    Forearm   1   Peripheral IV 08/06/17 Left Antecubital   08/06/17    2124    Antecubital   1          Labs/Imaging Results for orders placed or performed during the hospital encounter of 08/06/17 (from the past 48 hour(s))  Comprehensive metabolic panel     Status: Abnormal   Collection Time: 08/06/17  9:18 PM  Result Value Ref Range   Sodium 140 135 - 145 mmol/L   Potassium 4.3 3.5 - 5.1 mmol/L   Chloride 101 101 - 111 mmol/L   CO2 25 22 - 32 mmol/L   Glucose, Bld 237 (H) 65 - 99 mg/dL   BUN 18 6 - 20  mg/dL   Creatinine, Ser 0.88 0.44 - 1.00 mg/dL   Calcium 8.7 (L) 8.9 - 10.3 mg/dL   Total Protein 6.6 6.5 - 8.1 g/dL   Albumin 3.2 (L) 3.5 - 5.0 g/dL   AST 25 15 - 41 U/L   ALT 28 14 - 54 U/L   Alkaline Phosphatase 89 38 - 126 U/L   Total Bilirubin 0.6 0.3 - 1.2 mg/dL   GFR calc non Af Amer >60 >60 mL/min   GFR calc Af Amer >60 >60 mL/min  Comment: (NOTE) The eGFR has been calculated using the CKD EPI equation. This calculation has not been validated in all clinical situations. eGFR's persistently <60 mL/min signify possible Chronic Kidney Disease.    Anion gap 14 5 - 15    Comment: Performed at [facility], [place] [address] [place], [place] [address]  CBC WITH DIFFERENTIAL     Status: Abnormal   Collection Time: 08/06/17  9:18 PM  Result Value Ref Range   WBC 21.7 (H) 4.0 - 10.5 K/uL   RBC 4.81 3.87 - 5.11 MIL/uL   Hemoglobin 13.3 12.0 - 15.0 g/dL   HCT 41.9 36.0 - 46.0 %   MCV 87.1 78.0 - 100.0 fL   MCH 27.7 26.0 - 34.0 pg   MCHC 31.7 30.0 - 36.0 g/dL   RDW 17.3 (H) 11.5 - 15.5 %   Platelets 357 150 - 400 K/uL   Neutrophils Relative % 86 %   Neutro Abs 20.0 (H) 1.7 - 7.7 K/uL   Band Neutrophils 0 %   Lymphocytes Relative 8 %   Lymphs Abs 1.8 0.7 - 4.0 K/uL   Monocytes Relative 6 %   Monocytes Absolute 1.5 (H) 0.1 - 1.0 K/uL   Eosinophils Relative 0 %   Eosinophils Absolute 0.0 0.0 - 0.7 K/uL   Basophils Relative 0 %   Basophils Absolute 0.0 0.0 - 0.1 K/uL   RBC Morphology MORPHOLOGY UNREMARKABLE     Comment: Performed at [facility], [place] [address], [place], [place] [address]  I-Stat CG4 Lactic Acid, ED  (not at  [facility])     Status: Abnormal   Collection Time: 08/06/17  9:28 PM  Result Value Ref Range   Lactic Acid, Venous 3.88 (HH) 0.5 - 1.9 mmol/L   Comment NOTIFIED PHYSICIAN   Urinalysis, Routine w reflex microscopic     Status: Abnormal   Collection Time: 08/06/17 11:38 PM  Result Value Ref Range   Color, Urine YELLOW  YELLOW   APPearance CLEAR CLEAR   Specific Gravity, Urine 1.019 1.005 - 1.030   pH 6.0 5.0 - 8.0   Glucose, UA NEGATIVE NEGATIVE mg/dL   Hgb urine dipstick SMALL (A) NEGATIVE   Bilirubin Urine NEGATIVE NEGATIVE   Ketones, ur NEGATIVE NEGATIVE mg/dL   Protein, ur NEGATIVE NEGATIVE mg/dL   Nitrite NEGATIVE NEGATIVE   Leukocytes, UA NEGATIVE NEGATIVE   RBC / HPF 0-5 0 - 5 RBC/hpf   WBC, UA 0-5 0 - 5 WBC/hpf   Bacteria, UA NONE SEEN NONE SEEN   Squamous Epithelial / LPF 0-5 (A) NONE SEEN   Mucus PRESENT    Hyaline Casts, UA PRESENT     Comment: Performed at [facility], [place] [address] [place], [place] [address]  I-Stat CG4 Lactic Acid, ED  (not at  [facility])     Status: Abnormal   Collection Time: 08/06/17 11:46 PM  Result Value Ref Range   Lactic Acid, Venous 3.38 (HH) 0.5 - 1.9 mmol/L   Comment NOTIFIED PHYSICIAN    *Note: Due to a large number of results and/or encounters for the requested time period, some results have not been displayed. A complete set of results can be found in Results Review.   Dg Chest 2 View  Result Date: 08/06/2017 CLINICAL DATA:  Cough fever and chest pain EXAM: CHEST - 2 VIEW COMPARISON:  08/05/2017, 05/26/2017 FINDINGS: Mild diffuse bilateral ground-[name] opacity most notable in the left upper lung and right base. No pleural effusion. Stable  cardiomediastinal silhouette. No pneumothorax. IMPRESSION: Suspected subtle ground-[name] opacity within the left upper and right lower lungs, possible pneumonia. Electronically Signed   By: Donavan Foil M.D.   On: 08/06/2017 20:16   Dg Chest Port 1 View  Result Date: 08/05/2017 CLINICAL DATA:  Shortness of breath. EXAM: PORTABLE CHEST 1 VIEW COMPARISON:  07/28/2017. FINDINGS: Mediastinum and hilar structures are normal. Lungs are clear. No pleural effusion or pneumothorax. No acute bony abnormality. IMPRESSION: No acute cardiac pulmonary disease. Electronically Signed   By: Marcello Moores  Register   On:  08/05/2017 07:39    Pending Labs Unresulted Labs (From admission, onward)   Start     Ordered   08/13/17 0500  Creatinine, serum  (enoxaparin (LOVENOX)    CrCl >/= 30 ml/min)  Weekly,   R    Comments:  while on enoxaparin therapy    08/07/17 0405   08/07/17 7591  Basic metabolic panel  Tomorrow morning,   R     08/07/17 0405   08/07/17 0500  CBC  Tomorrow morning,   R     08/07/17 0405   08/06/17 2054  Blood Culture (routine x 2)  BLOOD CULTURE X 2,   STAT     08/06/17 2054      Vitals/Pain Today's Vitals   08/06/17 2142 08/06/17 2200 08/06/17 2230 08/06/17 2344  BP:  (!) 183/99 (!) 167/90   Pulse:  (!) 116 (!) 112   Resp:  (!) 23 (!) 25   SpO2:  100% 97% 100%  Weight: (!) 336 lb (152.4 kg)     Height: '5\' 6"'$  (1.676 m)       Isolation Precautions No active isolations  Medications Medications  albuterol (PROVENTIL,VENTOLIN) solution continuous neb (0 mg/hr Nebulization Stopped 08/06/17 2220)  apixaban (ELIQUIS) tablet 5 mg (has no administration in time range)  albuterol (PROVENTIL HFA;VENTOLIN HFA) 108 (90 Base) MCG/ACT inhaler 2 puff (has no administration in time range)  mometasone-formoterol (DULERA) 200-5 MCG/ACT inhaler 2 puff (has no administration in time range)  busPIRone (BUSPAR) tablet 7.5 mg (has no administration in time range)  diltiazem (CARDIZEM CD) 24 hr capsule 360 mg (has no administration in time range)  fluticasone (FLONASE) 50 MCG/ACT nasal spray 2 spray (has no administration in time range)  furosemide (LASIX) tablet 40 mg (has no administration in time range)  glipiZIDE (GLUCOTROL) tablet 2.5 mg (has no administration in time range)  guaiFENesin (MUCINEX) 12 hr tablet 600 mg (has no administration in time range)  ipratropium-albuterol (DUONEB) 0.5-2.5 (3) MG/3ML nebulizer solution 3 mL (has no administration in time range)  losartan (COZAAR) tablet 100 mg (has no administration in time range)  metoprolol tartrate (LOPRESSOR) tablet 100 mg (has no  administration in time range)  mometasone-formoterol (DULERA) 200-5 MCG/ACT inhaler 2 puff (has no administration in time range)  montelukast (SINGULAIR) tablet 10 mg (has no administration in time range)  nicotine (NICODERM CQ - dosed in mg/24 hr) patch 7 mg (has no administration in time range)  nystatin (MYCOSTATIN) 100000 UNIT/ML suspension 500,000 Units (has no administration in time range)  pantoprazole (PROTONIX) EC tablet 40 mg (has no administration in time range)  sertraline (ZOLOFT) tablet 100 mg (has no administration in time range)  spironolactone (ALDACTONE) tablet 100 mg (has no administration in time range)  methylPREDNISolone sodium succinate (SOLU-MEDROL) 125 mg/2 mL injection 60 mg (has no administration in time range)  albuterol (PROVENTIL,VENTOLIN) solution continuous neb (10 mg/hr Nebulization Restarted 08/06/17 2344)  enoxaparin (LOVENOX) injection 40 mg (has  no administration in time range)  ondansetron (ZOFRAN) tablet 4 mg (has no administration in time range)    Or  ondansetron (ZOFRAN) injection 4 mg (has no administration in time range)  zolpidem (AMBIEN) tablet 5 mg (has no administration in time range)  insulin aspart (novoLOG) injection 0-15 Units (has no administration in time range)  insulin aspart (novoLOG) injection 0-5 Units (has no administration in time range)  albuterol (PROVENTIL) (2.5 MG/3ML) 0.083% nebulizer solution 5 mg (5 mg Nebulization Given 08/06/17 2043)  ceFEPIme (MAXIPIME) 2 g in sodium chloride 0.9 % 100 mL IVPB (0 g Intravenous Stopped 08/06/17 2154)  vancomycin (VANCOCIN) IVPB 1000 mg/200 mL premix (0 mg Intravenous Stopped 08/06/17 2337)  ipratropium (ATROVENT) nebulizer solution 0.5 mg (0.5 mg Nebulization Given 08/06/17 2108)  magnesium sulfate IVPB 2 g 50 mL (0 g Intravenous Stopped 08/06/17 2225)  sodium chloride 0.9 % bolus 1,000 mL (0 mLs Intravenous Stopped 08/06/17 2242)  vancomycin (VANCOCIN) 1,500 mg in sodium chloride 0.9 % 500 mL IVPB  (0 mg Intravenous Stopped 08/07/17 0228)    Mobility walks

## 2017-08-07 NOTE — Progress Notes (Signed)
Pt refused Lactic Acid level to be drawn. MD made aware.

## 2017-08-07 NOTE — Progress Notes (Addendum)
PROGRESS NOTE    April [name]  OHY:[id number] DOB: [birth year] DOA: 08/06/2017 PCP: [name], MD    Brief Narrative: April [name]  is a 47 y.o. female, with past medical history significant for chronic severe asthmatic, noncompliance, A. fib, morbid obesity, hypertension, depression obstructive sleep apnea, allergic rhinitis and GERD,  discharged today from our facility after improvement .  Patient was treated appropriately with systemic steroids, supplemental oxygen and bronchodilators and pulmonary cleared the patient.  Patient took her medications and went home where her symptoms started worsening again with fever and chills and she presented to the emergency room .  White blood cell count 21.7, with audible wheezes and rhonchi. She was found to have pneumonia.      Assessment & Plan:   Active Problems:   HCAP (healthcare-associated pneumonia)   Acute respiratory failure with hypoxia sec to health care associated pneumonia and asthma exacerbation/ COPD exacerbation.  - admitted for IV antibiotics, resume IV vancomycin, IV cefepime, duo-nebs every 4 hours.  - she was found otbe profusely wheezing, was started on high dose steroids, duo nebs.and dulera   - [facility] oxygen to keep sats greater than 90.  - blood cultures are negative.  - trend lactic acid, pt refused repeat lactic acid levels.      Chronic diastolic heart failure: no pulm edema on cxr or pedal edema on exam.  Resume lasix and spironolactone.    Paroxysmal atrial fibrillation.  Rate controlled with cardizem and anti coagulation with eliquis.    Tobacco abuse:  On nicotine patch.    Type 2 DM: uncontrolled with hyperglycemia: CBG (last 3)  Recent Labs    08/07/17 0844 08/07/17 1124 08/07/17 1646  GLUCAP 311* 351* 270*   Probably sec to high dose steroids.  hgba1c is around 7.  Add novolog 3 units TIDAC.  Resume glipizide.   Leukocytosis: Possibly from infection and steroids.    Mild normocytic  anemia:  Hemoglobin around 11.   Hypertension;  Well controlled.   GERD: stable.   OSA: on CPAP.   DVT prophylaxis: eliquis.  Code Status: full code Family Communication: none at bedside.  Disposition Plan: pending further evaluation.    Consultants:   None.    Procedures: none.    Antimicrobials: vancomycin and cefepime since admission.    Subjective: Requesting pain medication.  Objective: Vitals:   08/07/17 0230 08/07/17 0300 08/07/17 0838 08/07/17 1315  BP:  (!) 165/93  132/67  Pulse:  (!) 118  71  Resp:  (!) 21  (!) 21  Temp:  98.3 F (36.8 C)  98.2 F (36.8 C)  TempSrc:  Oral  Oral  SpO2:  99% 96% 100%  Weight: (!) 156.2 kg (344 lb 6.4 oz)     Height: 5\' 6"  (1.676 m)       Intake/Output Summary (Last 24 hours) at 08/07/2017 1748 Last data filed at 08/07/2017 1529 Gross per 24 hour  Intake 1180 ml  Output -  Net 1180 ml   Filed Weights   08/06/17 2142 08/07/17 0230  Weight: (!) 152.4 kg (336 lb) (!) 156.2 kg (344 lb 6.4 oz)    Examination:  General exam: in mild distress from sob on 4lit of [place] oxygen.  Respiratory system: bilateral exp wheezing, with some tachypnea. Cardiovascular system: S1 & S2 heard, RRR. No JVD, murmurs, No pedal edema. Gastrointestinal system: Abdomen is nondistended, soft and nontender. No organomegaly or masses felt. Normal bowel sounds heard. Central nervous system: Alert and oriented. No  focal neurological deficits. Extremities: Symmetric 5 x 5 power. Skin: No rashes, lesions or ulcers Psychiatry:  Mood & affect appropriate.     Data Reviewed: I have personally reviewed following labs and imaging studies  CBC: Recent Labs  Lab 08/01/17 0540 08/06/17 2118 08/07/17 0517  WBC 18.3* 21.7* 22.3*  NEUTROABS 16.2* 20.0*  --   HGB 11.7* 13.3 11.9*  HCT 37.4 41.9 38.8  MCV 86.8 87.1 88.2  PLT 417* 357 626   Basic Metabolic Panel: Recent Labs  Lab 08/01/17 0540 08/05/17 0606 08/06/17 2118 08/07/17 0517  NA  133* 136 140 136  K 3.9 3.9 4.3 3.7  CL 99* 99* 101 98*  CO2 23 27 25 22   GLUCOSE 329* 252* 237* 303*  BUN 18 15 18 15   CREATININE 0.80 0.75 0.88 0.87  CALCIUM 8.1* 8.0* 8.7* 8.0*  MG 2.2  --   --   --    GFR: Estimated Creatinine Clearance: 127.8 mL/min (by C-G formula based on SCr of 0.87 mg/dL). Liver Function Tests: Recent Labs  Lab 08/06/17 2118  AST 25  ALT 28  ALKPHOS 89  BILITOT 0.6  PROT 6.6  ALBUMIN 3.2*   No results for input(s): LIPASE, AMYLASE in the last 168 hours. No results for input(s): AMMONIA in the last 168 hours. Coagulation Profile: No results for input(s): INR, PROTIME in the last 168 hours. Cardiac Enzymes: No results for input(s): CKTOTAL, CKMB, CKMBINDEX, TROPONINI in the last 168 hours. BNP (last 3 results) No results for input(s): PROBNP in the last 8760 hours. HbA1C: No results for input(s): HGBA1C in the last 72 hours. CBG: Recent Labs  Lab 08/05/17 2157 08/06/17 0837 08/07/17 0844 08/07/17 1124 08/07/17 1646  GLUCAP 208* 195* 311* 351* 270*   Lipid Profile: No results for input(s): CHOL, HDL, LDLCALC, TRIG, CHOLHDL, LDLDIRECT in the last 72 hours. Thyroid Function Tests: No results for input(s): TSH, T4TOTAL, FREET4, T3FREE, THYROIDAB in the last 72 hours. Anemia Panel: No results for input(s): VITAMINB12, FOLATE, FERRITIN, TIBC, IRON, RETICCTPCT in the last 72 hours. Sepsis Labs: Recent Labs  Lab 08/06/17 2128 08/06/17 2346  LATICACIDVEN 3.88* 3.38*    No results found for this or any previous visit (from the past 240 hour(s)).       Radiology Studies: Dg Chest 2 View  Result Date: 08/06/2017 CLINICAL DATA:  Cough fever and chest pain EXAM: CHEST - 2 VIEW COMPARISON:  08/05/2017, 05/26/2017 FINDINGS: Mild diffuse bilateral ground-glass opacity most notable in the left upper lung and right base. No pleural effusion. Stable cardiomediastinal silhouette. No pneumothorax. IMPRESSION: Suspected subtle ground-glass opacity  within the left upper and right lower lungs, possible pneumonia. Electronically Signed   By: Donavan Foil M.D.   On: 08/06/2017 20:16        Scheduled Meds: . apixaban  5 mg Oral BID  . busPIRone  7.5 mg Oral BID  . diltiazem  360 mg Oral Daily  . fluticasone  2 spray Each Nare Daily  . furosemide  40 mg Oral Daily  . glipiZIDE  2.5 mg Oral BID AC  . guaiFENesin  600 mg Oral BID  . insulin aspart  0-15 Units Subcutaneous TID WC  . insulin aspart  0-5 Units Subcutaneous QHS  . ipratropium-albuterol  3 mL Nebulization Q4H  . losartan  100 mg Oral Daily  . methylPREDNISolone (SOLU-MEDROL) injection  60 mg Intravenous Q6H  . metoprolol tartrate  100 mg Oral BID  . mometasone-formoterol  2 puff Inhalation BID  .  montelukast  10 mg Oral QHS  . nicotine  7 mg Transdermal Daily  . nystatin  5 mL Oral QID  . pantoprazole  40 mg Oral BID  . sertraline  100 mg Oral Daily  . spironolactone  100 mg Oral Daily   Continuous Infusions: . albuterol Stopped (08/06/17 2220)  . albuterol 10 mg/hr (08/06/17 2344)  . ceFEPime (MAXIPIME) IV Stopped (08/07/17 1538)  . vancomycin       LOS: 1 day    Time spent: 35 minutes.     Hosie Poisson, MD [facility] Pager [phone]  If 7PM-7AM, please contact night-coverage [email or web address] Password [facility] 08/07/2017, 5:48 PM

## 2017-08-08 ENCOUNTER — Inpatient Hospital Stay (HOSPITAL_COMMUNITY): Payer: Medicaid Other

## 2017-08-08 LAB — BLOOD GAS, ARTERIAL
Acid-Base Excess: 3.4 mmol/L — ABNORMAL HIGH (ref 0.0–2.0)
Bicarbonate: 26.9 mmol/L (ref 20.0–28.0)
DELIVERY SYSTEMS: POSITIVE
Drawn by: 103701
EXPIRATORY PAP: 10
O2 Content: 6 L/min
O2 Saturation: 92.9 %
PH ART: 7.456 — AB (ref 7.350–7.450)
PO2 ART: 67.5 mmHg — AB (ref 83.0–108.0)
Patient temperature: 98.6
pCO2 arterial: 38.7 mmHg (ref 32.0–48.0)

## 2017-08-08 LAB — BASIC METABOLIC PANEL
Anion gap: 12 (ref 5–15)
BUN: 14 mg/dL (ref 6–20)
CO2: 25 mmol/L (ref 22–32)
CREATININE: 0.85 mg/dL (ref 0.44–1.00)
Calcium: 8.3 mg/dL — ABNORMAL LOW (ref 8.9–10.3)
Chloride: 97 mmol/L — ABNORMAL LOW (ref 101–111)
GFR calc Af Amer: >60 mL/min (ref >60)
Glucose, Bld: 447 mg/dL — ABNORMAL HIGH (ref 65–99)
Potassium: 4 mmol/L (ref 3.5–5.1)
SODIUM: 134 mmol/L — AB (ref 135–145)

## 2017-08-08 LAB — LACTIC ACID, PLASMA: Lactic Acid, Venous: 1.9 mmol/L (ref 0.5–1.9)

## 2017-08-08 LAB — GLUCOSE, CAPILLARY
GLUCOSE-CAPILLARY: 451 mg/dL — AB (ref 65–99)
GLUCOSE-CAPILLARY: 255 mg/dL — AB (ref 65–99)
Glucose-Capillary: 393 mg/dL — ABNORMAL HIGH (ref 65–99)
Glucose-Capillary: 147 mg/dL — ABNORMAL HIGH (ref 65–99)

## 2017-08-08 LAB — CBC
HCT: 38.6 % (ref 36.0–46.0)
Hemoglobin: 12.1 g/dL (ref 12.0–15.0)
MCH: 27.2 pg (ref 26.0–34.0)
MCHC: 31.3 g/dL (ref 30.0–36.0)
MCV: 86.7 fL (ref 78.0–100.0)
PLATELETS: 267 10*3/uL (ref 150–400)
RBC: 4.45 MIL/uL (ref 3.87–5.11)
RDW: 17.3 % — AB (ref 11.5–15.5)
WBC: 27.5 10*3/uL — ABNORMAL HIGH (ref 4.0–10.5)

## 2017-08-08 MED ORDER — INSULIN ASPART 100 UNIT/ML ~~LOC~~ SOLN
0.0000 [IU] | Freq: Three times a day (TID) | SUBCUTANEOUS | Status: DC
  Administered 2017-08-08: 11 [IU] via SUBCUTANEOUS
  Administered 2017-08-08: 20 [IU] via SUBCUTANEOUS
  Administered 2017-08-09: 7 [IU] via SUBCUTANEOUS
  Administered 2017-08-09: 11 [IU] via SUBCUTANEOUS
  Administered 2017-08-09: 7 [IU] via SUBCUTANEOUS
  Administered 2017-08-10: 20 [IU] via SUBCUTANEOUS
  Administered 2017-08-10: 7 [IU] via SUBCUTANEOUS
  Administered 2017-08-10: 15 [IU] via SUBCUTANEOUS
  Administered 2017-08-11: 7 [IU] via SUBCUTANEOUS

## 2017-08-08 MED ORDER — DILTIAZEM HCL ER COATED BEADS 180 MG PO CP24
360.0000 mg | ORAL_CAPSULE | Freq: Every day | ORAL | Status: DC
  Administered 2017-08-08 – 2017-08-10 (×3): 360 mg via ORAL
  Filled 2017-08-08 (×5): qty 2

## 2017-08-08 MED ORDER — HYDRALAZINE HCL 20 MG/ML IJ SOLN
10.0000 mg | INTRAMUSCULAR | Status: DC | PRN
  Administered 2017-08-08 – 2017-08-13 (×6): 10 mg via INTRAVENOUS
  Filled 2017-08-08 (×7): qty 1

## 2017-08-08 MED ORDER — METOPROLOL TARTRATE 25 MG PO TABS
100.0000 mg | ORAL_TABLET | Freq: Two times a day (BID) | ORAL | Status: DC
  Administered 2017-08-08 – 2017-08-11 (×9): 100 mg via ORAL
  Filled 2017-08-08: qty 2
  Filled 2017-08-08 (×6): qty 4
  Filled 2017-08-08 (×2): qty 2

## 2017-08-08 MED ORDER — INSULIN GLARGINE 100 UNIT/ML ~~LOC~~ SOLN
15.0000 [IU] | Freq: Every day | SUBCUTANEOUS | Status: DC
  Administered 2017-08-08 – 2017-08-09 (×2): 15 [IU] via SUBCUTANEOUS
  Filled 2017-08-08 (×3): qty 0.15

## 2017-08-08 MED ORDER — NICOTINE 21 MG/24HR TD PT24
21.0000 mg | MEDICATED_PATCH | Freq: Every day | TRANSDERMAL | Status: DC
  Administered 2017-08-08 – 2017-08-17 (×10): 21 mg via TRANSDERMAL
  Filled 2017-08-08 (×10): qty 1

## 2017-08-08 MED ORDER — LORAZEPAM 1 MG PO TABS
1.0000 mg | ORAL_TABLET | Freq: Three times a day (TID) | ORAL | Status: DC | PRN
  Administered 2017-08-08 – 2017-08-09 (×2): 1 mg via ORAL
  Filled 2017-08-08 (×2): qty 1

## 2017-08-08 MED ORDER — GLIPIZIDE 5 MG PO TABS
5.0000 mg | ORAL_TABLET | Freq: Two times a day (BID) | ORAL | Status: DC
  Administered 2017-08-08 – 2017-08-09 (×2): 5 mg via ORAL
  Filled 2017-08-08 (×2): qty 1

## 2017-08-08 MED ORDER — ZOLPIDEM TARTRATE 5 MG PO TABS
5.0000 mg | ORAL_TABLET | Freq: Once | ORAL | Status: AC
  Administered 2017-08-08: 5 mg via ORAL
  Filled 2017-08-08: qty 1

## 2017-08-08 MED ORDER — LOSARTAN POTASSIUM 50 MG PO TABS
100.0000 mg | ORAL_TABLET | Freq: Every day | ORAL | Status: DC
Start: 2017-08-08 — End: 2017-08-12
  Administered 2017-08-08 – 2017-08-11 (×5): 100 mg via ORAL
  Filled 2017-08-08 (×6): qty 2

## 2017-08-08 MED ORDER — NICOTINE 21 MG/24HR TD PT24: 21.0000 mg | MEDICATED_PATCH | Freq: Every day | TRANSDERMAL | Status: DC

## 2017-08-08 MED ORDER — INSULIN ASPART 100 UNIT/ML ~~LOC~~ SOLN
5.0000 [IU] | Freq: Three times a day (TID) | SUBCUTANEOUS | Status: DC
  Administered 2017-08-08 – 2017-08-11 (×9): 5 [IU] via SUBCUTANEOUS

## 2017-08-08 NOTE — Progress Notes (Addendum)
Inpatient Diabetes Program Recommendations  AACE/ADA: New Consensus Statement on Inpatient Glycemic Control (2015)  Target Ranges:  Prepandial:   less than 140 mg/dL      Peak postprandial:   less than 180 mg/dL (1-2 hours)      Critically ill patients:  140 - 180 mg/dL   Results for April [name], April [name] (MRN [id number]) as of 08/08/2017 09:13  Ref. Range 08/07/2017 08:44 08/07/2017 11:24 08/07/2017 16:46 08/07/2017 21:17  Glucose-Capillary Latest Ref Range: 65 - 99 mg/dL 311 (H)  11 units NOVOLOG  351 (H)  15 units NOVOLOG  270 (H)  11 units NOVOLOG  316 (H)  4 units NOVOLOG    Results for April [name], April [name] (MRN [id number]) as of 08/08/2017 09:13  Ref. Range 08/08/2017 07:32  Glucose-Capillary Latest Ref Range: 65 - 99 mg/dL 451 (H)  18 units NOVOLOG      Home DM Meds: Glipizide 2.5 mg BID  Current Insulin Orders: Glipizide 2.5 mg BID                                       Novolog Resistant Correction Scale/ SSI (0-20 units) TID AC + HS      Novolog 5 units TID with meals      Diagnosed with Diabetes back in early March during hospitalization.  Was discharged home with Rx for Lantus 15 units QHS.  Met with Dr. Margarita Rana at the [facility] and [facility] on 07/18/17.  At that visit, Lantus was stopped and patient was started on Glipizide 2.5 mg BID.  Is supposed to be testing CBGs 4 times per day.  Admitted on 07/29/17 for SOB.  Discharged on 08/06/17 and came right back to ED with continued SOB.     MD- Note patient getting Solumedrol 60 mg Q6 hours.  During last admission (just a day ago), patient was getting fairly large dose of Lantus (48 units daily).   Please consider starting Lantus 30 units daily this (0.2 units/kg dosing based on weight of 156 kg)     --Will follow patient during hospitalization--  Wyn Quaker RN, MSN, CDE Diabetes Coordinator Inpatient Glycemic Control Team Team Pager: [phone] (8a-5p)

## 2017-08-08 NOTE — Progress Notes (Signed)
Pt placed on 10cm H20 CPAP with Nasal mask as she uses at home with humidity.

## 2017-08-08 NOTE — Progress Notes (Signed)
PROGRESS NOTE    April [name]  ERD:[id number] DOB: 15-Aug-1970 DOA: 08/06/2017 PCP: [name], MD    Brief Narrative: April [name]  is a 47 y.o. female, with past medical history significant for chronic severe asthmatic, noncompliance, A. fib, morbid obesity, hypertension, depression obstructive sleep apnea, allergic rhinitis and GERD,  discharged today from our facility after improvement .  Patient was treated appropriately with systemic steroids, supplemental oxygen and bronchodilators and pulmonary cleared the patient.  Patient took her medications and went home where her symptoms started worsening again with fever and chills and she presented to the emergency room .  White blood cell count 21.7, with audible wheezes and rhonchi. She was found to have pneumonia.      Assessment & Plan:   Active Problems:   Morbid obesity with body mass index of 50.0-59.9 in adult [facility])   Essential hypertension   OSA on CPAP   Knee pain, bilateral   Tobacco abuse in remission   Leukocytosis   GERD (gastroesophageal reflux disease)   Moderate persistent asthma with exacerbation   Chronic diastolic CHF (congestive heart failure) (HCC)   SIRS (systemic inflammatory response syndrome) (HCC)   COPD with acute exacerbation ([place])   New onset atrial fibrillation (HCC)   Acute respiratory failure with hypoxia (HCC)   Unspecified atrial fibrillation (HCC)   Acute on chronic respiratory failure with hypoxemia ([place])   HCAP (healthcare-associated pneumonia)   Acute respiratory failure with hypoxia sec to health care associated pneumonia and asthma exacerbation/ COPD exacerbation.  - admitted for IV antibiotics, resume IV vancomycin, IV cefepime, duo-nebs every 4 hours.  - she continues to have profusely wheezing, was already on high dose steroids, duo nebs.and dulera  . - [place] oxygen to keep sats greater than 90.  - blood cultures are negative so far.  - trend lactic acid, pt refused repeat lactic acid  levels.  - would watch on IV steroids for 24 hours and try to wean her down the steroids and off the oxygen in the next 24 hours.      Chronic diastolic heart failure: no pulm edema on cxr or pedal edema on exam.  Resume lasix and spironolactone.  She reports she urinates frequently, will request for strict intake and output and daily weights.    Paroxysmal atrial fibrillation.  Rate controlled with cardizem and anti coagulation with eliquis.    Tobacco abuse:  On nicotine patch.    Type 2 DM: uncontrolled with hyperglycemia: CBG (last 3)  Recent Labs    08/07/17 2117 08/08/17 0732 08/08/17 1141  GLUCAP 316* 451* 393*   Probably sec to high dose steroids.  hgba1c is around 7.  Add novolog 3 units TIDAC and increased to 5 units TIDAC.  Increase glipizide and add lantus 15 units day.   Leukocytosis: Possibly from infection and steroids.    Mild normocytic anemia:  Hemoglobin around 11.   Hypertension;  Well controlled.   GERD: stable.   OSA: on CPAP.   DVT prophylaxis: eliquis.  Code Status: full code Family Communication: none at bedside.  Disposition Plan: pending RESOLUTION OF RESPIRATORY FAILURE.    Consultants:   None.    Procedures: none.    Antimicrobials: vancomycin and cefepime since admission.    Subjective: REQUESTING sleep medicine. Ordered CPAP.   Objective: Vitals:   08/08/17 0831 08/08/17 1156 08/08/17 1317 08/08/17 1513  BP: (!) 151/112  (!) 150/82   Pulse: 95  82   Resp:   (!) 22  Temp:   98.3 F (36.8 C)   TempSrc:   Oral   SpO2:  93% 92% 91%  Weight:      Height:        Intake/Output Summary (Last 24 hours) at 08/08/2017 1554 Last data filed at 08/08/2017 0600 Gross per 24 hour  Intake 940 ml  Output -  Net 940 ml   Filed Weights   08/06/17 2142 08/07/17 0230  Weight: (!) 152.4 kg (336 lb) (!) 156.2 kg (344 lb 6.4 oz)    Examination:  General exam: in mild distress from sob on 4lit of [place] oxygen.    Respiratory system: DIFFUSE bilateral exp wheezing, with some tachypnea. Cardiovascular system: S1 & S2 heard, RRR. No JVD, murmurs, No pedal edema. Gastrointestinal system: Abdomen is soft non tender non distended bowel sounds heard.  Central nervous system: Alert and oriented. No focal neurological deficits. Extremities:no cyanosis or clubbing.  Skin: No rashes, lesions or ulcers Psychiatry:  Mood & affect appropriate.     Data Reviewed: I have personally reviewed following labs and imaging studies  CBC: Recent Labs  Lab 08/06/17 2118 08/07/17 0517 08/08/17 0512  WBC 21.7* 22.3* 27.5*  NEUTROABS 20.0*  --   --   HGB 13.3 11.9* 12.1  HCT 41.9 38.8 38.6  MCV 87.1 88.2 86.7  PLT 357 308 761   Basic Metabolic Panel: Recent Labs  Lab 08/05/17 0606 08/06/17 2118 08/07/17 0517 08/08/17 0512  NA 136 140 136 134*  K 3.9 4.3 3.7 4.0  CL 99* 101 98* 97*  CO2 27 25 22 25   GLUCOSE 252* 237* 303* 447*  BUN 15 18 15 14   CREATININE 0.75 0.88 0.87 0.85  CALCIUM 8.0* 8.7* 8.0* 8.3*   GFR: Estimated Creatinine Clearance: 130.8 mL/min (by C-G formula based on SCr of 0.85 mg/dL). Liver Function Tests: Recent Labs  Lab 08/06/17 2118  AST 25  ALT 28  ALKPHOS 89  BILITOT 0.6  PROT 6.6  ALBUMIN 3.2*   No results for input(s): LIPASE, AMYLASE in the last 168 hours. No results for input(s): AMMONIA in the last 168 hours. Coagulation Profile: No results for input(s): INR, PROTIME in the last 168 hours. Cardiac Enzymes: No results for input(s): CKTOTAL, CKMB, CKMBINDEX, TROPONINI in the last 168 hours. BNP (last 3 results) No results for input(s): PROBNP in the last 8760 hours. HbA1C: No results for input(s): HGBA1C in the last 72 hours. CBG: Recent Labs  Lab 08/07/17 1124 08/07/17 1646 08/07/17 2117 08/08/17 0732 08/08/17 1141  GLUCAP 351* 270* 316* 451* 393*   Lipid Profile: No results for input(s): CHOL, HDL, LDLCALC, TRIG, CHOLHDL, LDLDIRECT in the last 72  hours. Thyroid Function Tests: No results for input(s): TSH, T4TOTAL, FREET4, T3FREE, THYROIDAB in the last 72 hours. Anemia Panel: No results for input(s): VITAMINB12, FOLATE, FERRITIN, TIBC, IRON, RETICCTPCT in the last 72 hours. Sepsis Labs: Recent Labs  Lab 08/06/17 2128 08/06/17 2346  LATICACIDVEN 3.88* 3.38*    Recent Results (from the past 240 hour(s))  Blood Culture (routine x 2)     Status: None (Preliminary result)   Collection Time: 08/06/17  9:18 PM  Result Value Ref Range Status   Specimen Description   Final    BLOOD LEFT ANTECUBITAL Performed at [facility] [address], [place], [place] [address]    Special Requests   Final    BOTTLES DRAWN AEROBIC AND ANAEROBIC Blood Culture adequate volume Performed at [facility] [address]  [place], [place] [address]    Culture   Final    NO GROWTH 1 DAY Performed at [facility], [place] [address], [place], [place] [address]    Report Status PENDING  Incomplete  Blood Culture (routine x 2)     Status: None (Preliminary result)   Collection Time: 08/06/17  9:18 PM  Result Value Ref Range Status   Specimen Description   Final    BLOOD BLOOD RIGHT HAND Performed at [facility] [address], [place], [place] [address]    Special Requests   Final    BOTTLES DRAWN AEROBIC AND ANAEROBIC Blood Culture results may not be optimal due to an inadequate volume of blood received in culture bottles Performed at [facility] [address] [place], [place] [address]    Culture   Final    NO GROWTH 1 DAY Performed at [facility], [place] [address] [place], [place] [address]    Report Status PENDING  Incomplete         Radiology Studies: Dg Chest 2 View  Result Date: 08/06/2017 CLINICAL DATA:  Cough fever and chest pain EXAM: CHEST - 2 VIEW COMPARISON:  08/05/2017, 05/26/2017 FINDINGS: Mild diffuse bilateral ground-glass  opacity most notable in the left upper lung and right base. No pleural effusion. Stable cardiomediastinal silhouette. No pneumothorax. IMPRESSION: Suspected subtle ground-glass opacity within the left upper and right lower lungs, possible pneumonia. Electronically Signed   By: Donavan Foil M.D.   On: 08/06/2017 20:16        Scheduled Meds: . apixaban  5 mg Oral BID  . busPIRone  7.5 mg Oral BID  . diltiazem  360 mg Oral Daily  . fluticasone  2 spray Each Nare Daily  . furosemide  40 mg Oral Daily  . glipiZIDE  5 mg Oral BID AC  . guaiFENesin  600 mg Oral BID  . insulin aspart  0-20 Units Subcutaneous TID WC  . insulin aspart  0-5 Units Subcutaneous QHS  . insulin aspart  5 Units Subcutaneous TID WC  . insulin glargine  15 Units Subcutaneous Daily  . ipratropium-albuterol  3 mL Nebulization Q4H  . losartan  100 mg Oral Daily  . methylPREDNISolone (SOLU-MEDROL) injection  60 mg Intravenous Q6H  . metoprolol tartrate  100 mg Oral BID  . mometasone-formoterol  2 puff Inhalation BID  . montelukast  10 mg Oral QHS  . [START ON 08/09/2017] nicotine  21 mg Transdermal Daily  . nystatin  5 mL Oral QID  . pantoprazole  40 mg Oral BID  . sertraline  100 mg Oral Daily  . spironolactone  100 mg Oral Daily   Continuous Infusions: . ceFEPime (MAXIPIME) IV Stopped (08/08/17 1448)  . vancomycin Stopped (08/08/17 0935)     LOS: 2 days    Time spent: 35 minutes.     Hosie Poisson, MD [facility] Pager [phone]  If 7PM-7AM, please contact night-coverage [email or web address] Password [facility] 08/08/2017, 3:54 PM

## 2017-08-08 NOTE — Progress Notes (Signed)
Pt with increased work of breath and wheezes. RT gave pt a breathing treatment and placed pt back on CPAP. Pt with some relief. MD notified. New orders placed in Epic.

## 2017-08-08 NOTE — Progress Notes (Signed)
Pt received from 4 east, nasal canula at 4l, sats 80"s. RT called to bring BiPAP machine. Patient placed on BiPAP, tolerating well, BP elevated, Hydralazine given, will recheck when appropriate. Oriented to unit, MRSA swab sent, elink notified, tele monitoring notified. Hoyle Barr, RN

## 2017-08-08 NOTE — Progress Notes (Signed)
Patient's BP is 178/108, MD Opyd was paged. Waiting on new orders.

## 2017-08-09 DIAGNOSIS — J9621 Acute and chronic respiratory failure with hypoxia: Secondary | ICD-10-CM

## 2017-08-09 DIAGNOSIS — J189 Pneumonia, unspecified organism: Secondary | ICD-10-CM

## 2017-08-09 LAB — BLOOD GAS, ARTERIAL
Acid-Base Excess: 3 mmol/L — ABNORMAL HIGH (ref 0.0–2.0)
BICARBONATE: 26.5 mmol/L (ref 20.0–28.0)
DELIVERY SYSTEMS: POSITIVE
Drawn by: 257881
EXPIRATORY PAP: 7
FIO2: 50
INSPIRATORY PAP: 12
Mode: POSITIVE
O2 Saturation: 88.3 %
Patient temperature: 98.6
pCO2 arterial: 38.5 mmHg (ref 32.0–48.0)
pH, Arterial: 7.453 — ABNORMAL HIGH (ref 7.350–7.450)
pO2, Arterial: 57.7 mmHg — ABNORMAL LOW (ref 83.0–108.0)

## 2017-08-09 LAB — CBC WITH DIFFERENTIAL/PLATELET
BASOS ABS: 0 10*3/uL (ref 0.0–0.1)
Basophils Relative: 0 %
Eosinophils Absolute: 0 10*3/uL (ref 0.0–0.7)
Eosinophils Relative: 0 %
HEMATOCRIT: 40.5 % (ref 36.0–46.0)
HEMOGLOBIN: 13 g/dL (ref 12.0–15.0)
LYMPHS PCT: 1 %
Lymphs Abs: 0.3 10*3/uL — ABNORMAL LOW (ref 0.7–4.0)
MCH: 27.6 pg (ref 26.0–34.0)
MCHC: 32.1 g/dL (ref 30.0–36.0)
MCV: 86 fL (ref 78.0–100.0)
MONO ABS: 0.5 10*3/uL (ref 0.1–1.0)
Monocytes Relative: 2 %
NEUTROS ABS: 23.9 10*3/uL — AB (ref 1.7–7.7)
NEUTROS PCT: 97 %
Platelets: 262 10*3/uL (ref 150–400)
RBC: 4.71 MIL/uL (ref 3.87–5.11)
RDW: 17.3 % — AB (ref 11.5–15.5)
WBC: 24.7 10*3/uL — ABNORMAL HIGH (ref 4.0–10.5)

## 2017-08-09 LAB — BASIC METABOLIC PANEL
ANION GAP: 15 (ref 5–15)
Anion gap: 14 (ref 5–15)
BUN: 19 mg/dL (ref 6–20)
BUN: 20 mg/dL (ref 6–20)
CALCIUM: 8.6 mg/dL — AB (ref 8.9–10.3)
CHLORIDE: 95 mmol/L — AB (ref 101–111)
CO2: 23 mmol/L (ref 22–32)
CO2: 26 mmol/L (ref 22–32)
Calcium: 8.4 mg/dL — ABNORMAL LOW (ref 8.9–10.3)
Chloride: 95 mmol/L — ABNORMAL LOW (ref 101–111)
Creatinine, Ser: 0.72 mg/dL (ref 0.44–1.00)
Creatinine, Ser: 0.8 mg/dL (ref 0.44–1.00)
GFR calc Af Amer: >60 mL/min (ref >60)
GFR calc non Af Amer: >60 mL/min (ref >60)
GLUCOSE: 295 mg/dL — AB (ref 65–99)
GLUCOSE: 272 mg/dL — AB (ref 65–99)
POTASSIUM: 3.9 mmol/L (ref 3.5–5.1)
Potassium: 4 mmol/L (ref 3.5–5.1)
Sodium: 133 mmol/L — ABNORMAL LOW (ref 135–145)
Sodium: 135 mmol/L (ref 135–145)

## 2017-08-09 LAB — GLUCOSE, CAPILLARY
GLUCOSE-CAPILLARY: 228 mg/dL — AB (ref 65–99)
GLUCOSE-CAPILLARY: 222 mg/dL — AB (ref 65–99)
Glucose-Capillary: 285 mg/dL — ABNORMAL HIGH (ref 65–99)
Glucose-Capillary: 223 mg/dL — ABNORMAL HIGH (ref 65–99)

## 2017-08-09 LAB — PROCALCITONIN: Procalcitonin: <0.1 ng/mL

## 2017-08-09 LAB — MRSA PCR SCREENING: MRSA BY PCR: POSITIVE — AB

## 2017-08-09 LAB — BRAIN NATRIURETIC PEPTIDE: B Natriuretic Peptide: 42.7 pg/mL (ref 0.0–100.0)

## 2017-08-09 MED ORDER — ALBUTEROL SULFATE (2.5 MG/3ML) 0.083% IN NEBU
2.5000 mg | INHALATION_SOLUTION | RESPIRATORY_TRACT | Status: DC | PRN
  Administered 2017-08-09 (×3): 2.5 mg via RESPIRATORY_TRACT
  Filled 2017-08-09: qty 3

## 2017-08-09 MED ORDER — ALBUTEROL SULFATE (2.5 MG/3ML) 0.083% IN NEBU
2.5000 mg | INHALATION_SOLUTION | RESPIRATORY_TRACT | Status: AC
  Administered 2017-08-09 (×2): 2.5 mg via RESPIRATORY_TRACT
  Filled 2017-08-09: qty 3

## 2017-08-09 MED ORDER — ARFORMOTEROL TARTRATE 15 MCG/2ML IN NEBU
15.0000 ug | INHALATION_SOLUTION | Freq: Two times a day (BID) | RESPIRATORY_TRACT | Status: DC
  Administered 2017-08-09 – 2017-08-13 (×8): 15 ug via RESPIRATORY_TRACT
  Filled 2017-08-09 (×8): qty 2

## 2017-08-09 MED ORDER — MUPIROCIN 2 % EX OINT
1.0000 "application " | TOPICAL_OINTMENT | Freq: Two times a day (BID) | CUTANEOUS | Status: AC
  Administered 2017-08-09 – 2017-08-13 (×10): 1 via NASAL
  Filled 2017-08-09 (×3): qty 22

## 2017-08-09 MED ORDER — LORAZEPAM 2 MG/ML IJ SOLN
1.0000 mg | Freq: Four times a day (QID) | INTRAMUSCULAR | Status: DC | PRN
  Administered 2017-08-09 – 2017-08-10 (×3): 2 mg via INTRAVENOUS
  Filled 2017-08-09 (×3): qty 1

## 2017-08-09 MED ORDER — BUDESONIDE 0.25 MG/2ML IN SUSP
0.2500 mg | Freq: Two times a day (BID) | RESPIRATORY_TRACT | Status: DC
  Administered 2017-08-09 – 2017-08-11 (×4): 0.25 mg via RESPIRATORY_TRACT
  Filled 2017-08-09 (×5): qty 2

## 2017-08-09 MED ORDER — LORAZEPAM 2 MG/ML IJ SOLN
1.0000 mg | Freq: Once | INTRAMUSCULAR | Status: AC
  Administered 2017-08-09: 1 mg via INTRAVENOUS
  Filled 2017-08-09: qty 1

## 2017-08-09 MED ORDER — VANCOMYCIN HCL IN DEXTROSE 1-5 GM/200ML-% IV SOLN
1000.0000 mg | Freq: Two times a day (BID) | INTRAVENOUS | Status: DC
  Administered 2017-08-09 (×2): 1000 mg via INTRAVENOUS
  Filled 2017-08-09 (×2): qty 200

## 2017-08-09 MED ORDER — MAGNESIUM SULFATE 2 GM/50ML IV SOLN
2.0000 g | Freq: Once | INTRAVENOUS | Status: AC
  Administered 2017-08-09: 2 g via INTRAVENOUS
  Filled 2017-08-09: qty 50

## 2017-08-09 MED ORDER — FUROSEMIDE 10 MG/ML IJ SOLN
60.0000 mg | Freq: Once | INTRAMUSCULAR | Status: AC
  Administered 2017-08-09: 60 mg via INTRAVENOUS
  Filled 2017-08-09: qty 6

## 2017-08-09 MED ORDER — CHLORHEXIDINE GLUCONATE CLOTH 2 % EX PADS
6.0000 | MEDICATED_PAD | Freq: Every day | CUTANEOUS | Status: AC
  Administered 2017-08-11 – 2017-08-13 (×3): 6 via TOPICAL

## 2017-08-09 MED ORDER — FUROSEMIDE 10 MG/ML IJ SOLN
40.0000 mg | Freq: Two times a day (BID) | INTRAMUSCULAR | Status: DC
  Administered 2017-08-09 (×2): 40 mg via INTRAVENOUS
  Filled 2017-08-09 (×2): qty 4

## 2017-08-09 NOTE — Progress Notes (Signed)
PROGRESS NOTE    April [name]  WRU:[id number] DOB: [birth year] DOA: 08/06/2017 PCP: [name], MD    Brief Narrative: April [name]  is a 47 y.o. female, with past medical history significant for chronic severe asthmatic, noncompliance, A. fib, morbid obesity, hypertension, depression obstructive sleep apnea, allergic rhinitis and GERD,  discharged today from our facility after improvement .  Patient was treated appropriately with systemic steroids, supplemental oxygen and bronchodilators and pulmonary cleared the patient.  Patient took her medications and went home where her symptoms started worsening again with fever and chills and she presented to the emergency room .  White blood cell count 21.7, with audible wheezes and rhonchi. She was found to have pneumonia.      Assessment & Plan:   Active Problems:   Morbid obesity with body mass index of 50.0-59.9 in adult [facility])   Essential hypertension   OSA on CPAP   Knee pain, bilateral   Tobacco abuse in remission   Leukocytosis   GERD (gastroesophageal reflux disease)   Moderate persistent asthma with exacerbation   Chronic diastolic CHF (congestive heart failure) (HCC)   SIRS (systemic inflammatory response syndrome) (HCC)   COPD with acute exacerbation ([place])   New onset atrial fibrillation (HCC)   Acute respiratory failure with hypoxia (HCC)   Unspecified atrial fibrillation (HCC)   Acute on chronic respiratory failure with hypoxemia ([place])   HCAP (healthcare-associated pneumonia)   Acute respiratory failure with hypoxia sec to health care associated pneumonia and asthma exacerbation/ COPD exacerbation.  - admitted for IV antibiotics, resume IV vancomycin, IV cefepime, duo-nebs every 4 hours.  - she continues to have profusely wheezing, was already on high dose steroids, duo nebs.and dulera  . Change dulera to brovana and pulmicort.  - [place] oxygen/ bipap prn  to keep sats greater than 90.  - blood cultures are negative so far.    - trend lactic acid, pt refused repeat lactic acid levels.  - would watch on IV steroids for 24 hours and try to wean her down the steroids and off the oxygen in the next 24 hours.  - pro calcitonin is negataive.  Lactic acid is negative.  Pro bnp is 42.  Over night pt was transferred to stepdown for worsening sob and the need for BIPAP.  She is non compliant to bipap and CXR shows pulmonary edema.  She was started on IV lasix for diuresis.  In view of her tachypnea, worsening sob, and hypoxia, requested PCCM consult for recommendations.      Acute on Chronic diastolic heart failure:  CXR overnight showed pulm edema, started on IV lasix and transferred to step down.  Pro bnp is 42. , will request for strict intake and output and daily weights.    Paroxysmal atrial fibrillation.  Rate controlled with cardizem and anti coagulation with eliquis.    Tobacco abuse:  On nicotine patch.    Type 2 DM: uncontrolled with hyperglycemia: CBG (last 3)  Recent Labs    08/08/17 1626 08/08/17 2117 08/09/17 0718  GLUCAP 255* 147* 228*   Probably sec to high dose steroids.  hgba1c is around 7.  On 5 units of novolog TIDAC Added 15 units of lantus.   Leukocytosis: Possibly from infection and steroids.    Mild normocytic anemia:  Hemoglobin around 11.   Hypertension;  - sub optimally controlled. Prn hydralazine. Resume home meds.   GERD: stable.   OSA: on CPAP.   DVT prophylaxis: eliquis.  Code Status:  full code Family Communication: none at bedside.  Disposition Plan: remain in step down.    Consultants:   PCCM   Procedures: none.    Antimicrobials: vancomycin and cefepime since admission.    Subjective: Sob and cough. Worse than yesterday.   Objective: Vitals:   08/09/17 0635 08/09/17 0724 08/09/17 0800 08/09/17 0900  BP: (!) 158/80  (!) 183/102 (!) 195/120  Pulse: 91  (!) 104 96  Resp: (!) 24  (!) 43 (!) 46  Temp:  98.7 F (37.1 C)    TempSrc:  Oral     SpO2: 92% 94% (!) 83% 93%  Weight:      Height:        Intake/Output Summary (Last 24 hours) at 08/09/2017 3419 Last data filed at 08/09/2017 6222 Gross per 24 hour  Intake 860 ml  Output -  Net 860 ml   Filed Weights   08/06/17 2142 08/07/17 0230  Weight: (!) 152.4 kg (336 lb) (!) 156.2 kg (344 lb 6.4 oz)    Examination:  General exam: mod distress on bipap ths am.  Respiratory system: diffuse bilateral exp wheezing, with  tachypnea. Cardiovascular system: S1 & S2 heard, tachycardia,  No JVD, murmurs, trace pedal edema.  Gastrointestinal system: Abdomen is soft NT ND BS+ Central nervous system: Alert and oriented. Non focal.  Extremities:no cyanosis or clubbing.  Skin: No rashes, lesions or ulcers Psychiatry:  Anxious.     Data Reviewed: I have personally reviewed following labs and imaging studies  CBC: Recent Labs  Lab 08/06/17 2118 08/07/17 0517 08/08/17 0512  WBC 21.7* 22.3* 27.5*  NEUTROABS 20.0*  --   --   HGB 13.3 11.9* 12.1  HCT 41.9 38.8 38.6  MCV 87.1 88.2 86.7  PLT 357 308 979   Basic Metabolic Panel: Recent Labs  Lab 08/05/17 0606 08/06/17 2118 08/07/17 0517 08/08/17 0512  NA 136 140 136 134*  K 3.9 4.3 3.7 4.0  CL 99* 101 98* 97*  CO2 27 25 22 25   GLUCOSE 252* 237* 303* 447*  BUN 15 18 15 14   CREATININE 0.75 0.88 0.87 0.85  CALCIUM 8.0* 8.7* 8.0* 8.3*   GFR: Estimated Creatinine Clearance: 130.8 mL/min (by C-G formula based on SCr of 0.85 mg/dL). Liver Function Tests: Recent Labs  Lab 08/06/17 2118  AST 25  ALT 28  ALKPHOS 89  BILITOT 0.6  PROT 6.6  ALBUMIN 3.2*   No results for input(s): LIPASE, AMYLASE in the last 168 hours. No results for input(s): AMMONIA in the last 168 hours. Coagulation Profile: No results for input(s): INR, PROTIME in the last 168 hours. Cardiac Enzymes: No results for input(s): CKTOTAL, CKMB, CKMBINDEX, TROPONINI in the last 168 hours. BNP (last 3 results) No results for input(s): PROBNP in the  last 8760 hours. HbA1C: No results for input(s): HGBA1C in the last 72 hours. CBG: Recent Labs  Lab 08/08/17 0732 08/08/17 1141 08/08/17 1626 08/08/17 2117 08/09/17 0718  GLUCAP 451* 393* 255* 147* 228*   Lipid Profile: No results for input(s): CHOL, HDL, LDLCALC, TRIG, CHOLHDL, LDLDIRECT in the last 72 hours. Thyroid Function Tests: No results for input(s): TSH, T4TOTAL, FREET4, T3FREE, THYROIDAB in the last 72 hours. Anemia Panel: No results for input(s): VITAMINB12, FOLATE, FERRITIN, TIBC, IRON, RETICCTPCT in the last 72 hours. Sepsis Labs: Recent Labs  Lab 08/06/17 2128 08/06/17 2346 08/08/17 1849  LATICACIDVEN 3.88* 3.38* 1.9    Recent Results (from the past 240 hour(s))  Blood Culture (routine x 2)  Status: None (Preliminary result)   Collection Time: 08/06/17  9:18 PM  Result Value Ref Range Status   Specimen Description   Final    BLOOD LEFT ANTECUBITAL Performed at [facility] [address], [place], [place] [address]    Special Requests   Final    BOTTLES DRAWN AEROBIC AND ANAEROBIC Blood Culture adequate volume Performed at [facility] [address] [place], [place] [address]    Culture   Final    NO GROWTH 1 DAY Performed at [facility], [place] [address] [place], [place] [address]    Report Status PENDING  Incomplete  Blood Culture (routine x 2)     Status: None (Preliminary result)   Collection Time: 08/06/17  9:18 PM  Result Value Ref Range Status   Specimen Description   Final    BLOOD RIGHT HAND Performed at [facility], [place] [address] [place], [place] [address]    Special Requests   Final    BOTTLES DRAWN AEROBIC AND ANAEROBIC Blood Culture results may not be optimal due to an inadequate volume of blood received in culture bottles Performed at [facility] [address] [place], [place] [address]    Culture   Final    NO GROWTH 1 DAY Performed at [facility], [place] [address] [place], [place] [address]    Report Status PENDING  Incomplete  MRSA PCR Screening     Status: Abnormal   Collection Time: 08/08/17 10:27 PM  Result Value Ref Range Status   MRSA by PCR POSITIVE (A) NEGATIVE Final    Comment:        The GeneXpert MRSA Assay (FDA approved for NASAL specimens only), is one component of a comprehensive MRSA colonization surveillance program. It is not intended to diagnose MRSA infection nor to guide or monitor treatment for MRSA infections. RESULT CALLED TO, READ BACK BY AND VERIFIED WITH: M.KALLAM,RN 08/09/17 @0258  BY V.WILKINS Performed at [facility], [place] [address] [place], [place] [address]          Radiology Studies: Dg Chest 2 View  Result Date: 08/08/2017 CLINICAL DATA:  Acute onset shortness of breath today. COPD. Former smoker. EXAM: CHEST - 2 VIEW COMPARISON:  08/06/2017 FINDINGS: Heart size remains normal. Increased diffuse bilateral pulmonary airspace disease is seen, suspicious for pulmonary edema. Probable small right pleural effusion. IMPRESSION: Increased diffuse bilateral airspace disease, suspicious for pulmonary edema, and small right pleural effusion. Electronically Signed   By: Earle Gell M.D.   On: 08/08/2017 19:23        Scheduled Meds: . apixaban  5 mg Oral BID  . busPIRone  7.5 mg Oral BID  . Chlorhexidine Gluconate Cloth  6 each Topical Q0600  . diltiazem  360 mg Oral Daily  . fluticasone  2 spray Each Nare Daily  . furosemide  40 mg Intravenous BID  . guaiFENesin  600 mg Oral BID  . insulin aspart  0-20 Units Subcutaneous TID WC  . insulin aspart  0-5 Units Subcutaneous QHS  . insulin aspart  5 Units Subcutaneous TID WC  . insulin glargine  15 Units Subcutaneous Daily  . ipratropium-albuterol  3 mL Nebulization Q4H  . losartan  100 mg Oral Daily  . methylPREDNISolone (SOLU-MEDROL) injection  60 mg Intravenous Q6H  . metoprolol tartrate  100 mg Oral BID  .  mometasone-formoterol  2 puff Inhalation BID  . montelukast  10  mg Oral QHS  . mupirocin ointment  1 application Nasal BID  . nicotine  21 mg Transdermal Daily  . nystatin  5 mL Oral QID  . pantoprazole  40 mg Oral BID  . sertraline  100 mg Oral Daily  . spironolactone  100 mg Oral Daily   Continuous Infusions: . ceFEPime (MAXIPIME) IV Stopped (08/09/17 6579)  . vancomycin       LOS: 3 days    Time spent: 35 minutes.     Hosie Poisson, MD [facility] Pager [phone]  If 7PM-7AM, please contact night-coverage [email or web address] Password [facility] 08/09/2017, 9:37 AM

## 2017-08-09 NOTE — Progress Notes (Signed)
Patient was transferred to stepdown room 1222 to be placed on bipap. All personal belongings sent with patient. Report given to [place], Therapist, sports.

## 2017-08-09 NOTE — Progress Notes (Signed)
PT Cancellation Note  Patient Details Name: April [name] MRN: [id number] DOB: July 20, 1970   Cancelled Treatment:    Reason Eval/Treat Not Completed: Medical issues which prohibited therapychart reviewed. Noted ambulated  With nursing, noncompliant with BiPAP. Currently on hi Oxygen [employer]. Will check back another time.    Claretha Cooper 08/09/2017, 7:47 AM  Tresa Endo PT [phone]

## 2017-08-09 NOTE — Progress Notes (Signed)
Patient insistant on walking to bathroom to urinate. BSC obtained patient refuses to use it. Said to move it out because it will just give her another obstacle to work around. Has taken bipap off in preparation for getting to bathroom. Explained to patient that she needs the bipap on, danger of her becoming too exhausted and needing to be put on the ventilator. Patient a smoker, would be difficult to wean off. When bipap off, pt on 6L [facility] oxygen and sats low to mid 80's. Will not keep it on. Noncompliant despite the dangers being explained to her. Hoyle Barr, RN

## 2017-08-09 NOTE — Consult Note (Addendum)
PULMONARY / CRITICAL CARE MEDICINE   Name: April [name] MRN: [id number] DOB: 04-23-1971    ADMISSION DATE:  08/06/2017 CONSULTATION DATE:  08/09/2017  REFERRING MD:  Karleen Hampshire  CHIEF COMPLAINT:  Dyspnea  HISTORY OF PRESENT ILLNESS:   47 year old woman recently discharged from [facility] following LRTI/asthma exacerbation.  States that she was not feeling well even at discharge. Normally followed by Dr Baird Lyons at [facility] and last seen for AECOPD on 07/14/17. History of suboptimal compliance.    Admitted to [facility] on 4/2 and discharged from there 4/10 with diagnosis of AECOPD.    Admitted with same yesterday. Seen at request of Dr Karleen Hampshire who has started antibiotics for HAP, therapy for COPD and diuresis. She has requested PCCM involvement because the patient continues to breath at nearly 50/min.  This has been confirmed by the nurses and RT who have also noticed that the patient readily will get up and go to bathroom unassisted. Patient is refusing BiPAP.   PAST MEDICAL HISTORY :  She  has a past medical history of Acanthosis nigricans, Anxiety, Arthritis, Asthma, COPD (chronic obstructive pulmonary disease) ([place]), Depression, GERD (gastroesophageal reflux disease), WERXVQMG(867.6), Helicobacter pylori (H. pylori) infection, Hypertension, essential, Insomnia, Menorrhagia, Morbid obesity ([place]), OSA on CPAP, Pneumonia (X 1), Seasonal allergies, Shortness of breath, and Tobacco user.  PAST SURGICAL HISTORY: She  has a past surgical history that includes Tubal ligation (1996); Reduction mammaplasty (Bilateral, 09/2011); and Cardioversion (N/A, 05/30/2017).  Allergies  Allergen Reactions  . Contrast Media [Iodinated Diagnostic Agents] Itching    Ct contrast    No current facility-administered medications on file prior to encounter.    Current Outpatient Medications on File Prior to Encounter  Medication Sig  . albuterol (PROVENTIL HFA;VENTOLIN HFA) 108 (90 Base) MCG/ACT inhaler Inhale 2 puffs into the  lungs every 4 (four) hours as needed for wheezing or shortness of breath.  [name] apixaban (ELIQUIS) 5 MG TABS tablet Take 1 tablet (5 mg total) by mouth 2 (two) times daily.  . budesonide-formoterol (SYMBICORT) 160-4.5 MCG/ACT inhaler Inhale 2 puffs into the lungs 2 (two) times daily.  . busPIRone (BUSPAR) 7.5 MG tablet Take 1 tablet (7.5 mg total) 2 (two) times daily by mouth.  . butalbital-acetaminophen-caffeine (FIORICET, ESGIC) 50-325-40 MG tablet Take 1-2 tablets by mouth every 6 (six) hours as needed for headache.  . diltiazem (CARDIZEM CD) 360 MG 24 hr capsule Take 1 capsule (360 mg total) by mouth daily.  . famotidine (PEPCID) 20 MG tablet Take 1 tablet (20 mg total) by mouth 2 (two) times daily.  . fluticasone (FLONASE) 50 MCG/ACT nasal spray Place 2 sprays into both nostrils daily.  [name] glipiZIDE (GLUCOTROL) 5 MG tablet Take 0.5 tablets (2.5 mg total) by mouth 2 (two) times daily before a meal.  . guaiFENesin (MUCINEX) 600 MG 12 hr tablet Take 1 tablet (600 mg total) by mouth 2 (two) times daily.  [name] ipratropium-albuterol (DUONEB) 0.5-2.5 (3) MG/3ML SOLN Take 3 mLs by nebulization every 6 (six) hours.  [name] losartan (COZAAR) 100 MG tablet Take 1 tablet (100 mg total) by mouth daily.  . metoprolol tartrate (LOPRESSOR) 100 MG tablet Take 1 tablet (100 mg total) by mouth 2 (two) times daily.  . mometasone-formoterol (DULERA) 200-5 MCG/ACT AERO Inhale 2 puffs into the lungs 2 (two) times daily.  . montelukast (SINGULAIR) 10 MG tablet Take 1 tablet (10 mg total) by mouth at bedtime.  . sertraline (ZOLOFT) 100 MG tablet Take 1 tablet (100 mg total) by mouth  daily.  . spironolactone (ALDACTONE) 100 MG tablet Take 1 tablet (100 mg total) by mouth daily.  . furosemide (LASIX) 40 MG tablet Take 1 tablet (40 mg total) by mouth daily.  . nicotine (NICODERM CQ - DOSED IN MG/24 HR) 7 mg/24hr patch Place 1 patch (7 mg total) onto the skin daily.  [name] nystatin (MYCOSTATIN) 100000 UNIT/ML suspension Take 5 mLs  (500,000 Units total) by mouth 4 (four) times daily for 6 days.  . pantoprazole (PROTONIX) 40 MG tablet Take 1 tablet (40 mg total) by mouth 2 (two) times daily.  . predniSONE (DELTASONE) 20 MG tablet Take 1 tablet (20 mg total) by mouth daily with breakfast.    FAMILY HISTORY:  Her indicated that her mother is alive. She indicated that her father is alive. She indicated that the status of her maternal grandmother is unknown. She indicated that the status of her daughter is unknown. She indicated that the status of her paternal aunt is unknown.   SOCIAL HISTORY: She  reports that she quit smoking about 2 years ago. Her smoking use included cigarettes. She has a 13.00 pack-year smoking history. She has never used smokeless tobacco. She reports that she does not drink alcohol or use drugs.  REVIEW OF SYSTEMS:   ROS is limited due to limited patient participation in encounter.  SUBJECTIVE:  Patient reports ongoing dyspnea with cough productive of white sputum. She is unable to explain to me how her breathing is different from her baseline.   Nurses report the patient is been getting up and walking briskly to the bathroom.  She is dyspneic and desaturates into the 80s with ambulation but recovers to her baseline level of dyspnea.   VITAL SIGNS: BP (!) 195/120   Pulse 96   Temp 98.7 F (37.1 C) (Oral)   Resp (!) 46   Ht 5\' 6"  (1.676 m)   Wt (!) 344 lb 6.4 oz (156.2 kg)   SpO2 93%   BMI 55.59 kg/m   HEMODYNAMICS: Currently on no vasoactive infusions    VENTILATOR SETTINGS: FiO2 (%):  [28 %-40 %] 40 %  INTAKE / OUTPUT: I/O last 3 completed shifts: In: 32 [P.O.:660; IV Piggyback:900] Out: - Positive fluid balance  PHYSICAL EXAMINATION: General: Morbidly obese woman with hirsutism.  Moderate respiratory distress with shallow breathing.  No abdominal paradox.  Lying on her left side.  At times she is slow to respond to questions but then is able to speak in full sentences at a  rapid clip.   Neuro: Generally awake and able to follow commands and converse intelligently.  No focal deficits identified. HEENT:  Cushingoid facies. Cardiovascular: JVP is not visible.  Mild peripheral edema.  Sounds are distant but otherwise unremarkable.  Strong peripheral pulses with normal capillary refill. Lungs: Mild rhonchi throughout.  Occasional bibasilar Crackles. Abdomen: Large pannus.  Abdomen otherwise soft nontender. Musculoskeletal: No evidence of active joint disease Skin: Skin is intact  LABS:  BMET Recent Labs  Lab 08/06/17 2118 08/07/17 0517 08/08/17 0512  NA 140 136 134*  K 4.3 3.7 4.0  CL 101 98* 97*  CO2 25 22 25   BUN 18 15 14   CREATININE 0.88 0.87 0.85  GLUCOSE 237* 303* 447*    Electrolytes Recent Labs  Lab 08/06/17 2118 08/07/17 0517 08/08/17 0512  CALCIUM 8.7* 8.0* 8.3*    CBC Recent Labs  Lab 08/07/17 0517 08/08/17 0512 08/09/17 1014  WBC 22.3* 27.5* 24.7*  HGB 11.9* 12.1 13.0  HCT 38.8 38.6  40.5  PLT 308 267 262    Coag's No results for input(s): APTT, INR in the last 168 hours.  Sepsis Markers Recent Labs  Lab 08/06/17 2128 08/06/17 2346 08/08/17 1849  LATICACIDVEN 3.88* 3.38* 1.9    ABG Recent Labs  Lab 08/05/17 1245 08/08/17 1830 08/09/17 0855  PHART 7.425 7.456* 7.453*  PCO2ART 46.8 38.7 38.5  PO2ART 71.1* 67.5* 57.7*    Liver Enzymes Recent Labs  Lab 08/06/17 2118  AST 25  ALT 28  ALKPHOS 89  BILITOT 0.6  ALBUMIN 3.2*    Cardiac Enzymes No results for input(s): TROPONINI, PROBNP in the last 168 hours.  Glucose Recent Labs  Lab 08/08/17 0732 08/08/17 1141 08/08/17 1626 08/08/17 2117 08/09/17 0718 08/09/17 1101  GLUCAP 451* 393* 255* 147* 228* 285*    Imaging Dg Chest 2 View  Result Date: 08/08/2017 CLINICAL DATA:  Acute onset shortness of breath today. COPD. Former smoker. EXAM: CHEST - 2 VIEW COMPARISON:  08/06/2017 FINDINGS: Heart size remains normal. Increased diffuse bilateral  pulmonary airspace disease is seen, suspicious for pulmonary edema. Probable small right pleural effusion. IMPRESSION: Increased diffuse bilateral airspace disease, suspicious for pulmonary edema, and small right pleural effusion. Electronically Signed   By: Earle Gell M.D.   On: 08/08/2017 19:23    CULTURES: No new culture data  ANTIBIOTICS: On cefepime and vancomycin.  SIGNIFICANT EVENTS: Admitted to ICU  LINES/TUBES: Urinary catheter and 2 peripheral IVs.  DISCUSSION: 47 year old woman with mixed respiratory disease related to morbid obesity.  He has severe baseline dyspnea and is historically noncompliant with medical therapy.  It is not clear to me different her breathing is from her baseline.  While she appears dyspneic she is also able to speak normally.  These aspects contradict themselves, and given the difficulty of intubation and the risk of chronic vent dependence in this patient, an approach of watchful waiting is prudent.  ASSESSMENT / PLAN:  PULMONARY A: Acute exacerbation of COPD with possible community acquired pneumonia.  Difficult to assess volume status. P:   Continue current therapy for COPD exacerbation and pneumonia.  A BNP would be useful to help sort out any volume overload component.  We will try 3 stacked albuterol treatments to break current bronchospasm as well as IV magnesium. We will consider making arrangements to administer her Xolair as an inpatient.  CARDIOVASCULAR A:  Severe hypertension apparently related to hyperaldosteronism, although could also be related to morbid obesity and possible polycystic ovarian and kidney disease were those to be present.  She carries a history of diastolic heart failure although her diastolic parameters on her latest echo showed only grade 1 diastolic dysfunction.  This degree of relaxation impairment may not be sufficient to produce significant volume overload. Rate controlled atrial fibrillation. P:  She is  already received a dose of diuretics.  Additional diuresis may still be indicated, particularly if her BNP is elevated.  Of note, it was normal at 82 at discharge from [facility].  Continue current home therapy for atrial fibrillation  RENAL A:   Renal function is normal at this time with no electrolyte issues.  CO2 is normal suggesting that she is not a chronic CO2 retainer P:   Continue to monitor volume status and diuresis as appropriate.  GASTROINTESTINAL A:   No specific abdominal issues P:   Does not meet criteria for stress ulcer prophylaxis.  HEMATOLOGIC A:   Normal hemoglobin.   P:  No indications for transfusion  INFECTIOUS A:  Leukocytosis and possible left lower lobe infiltrate.  This was not present on her previous x-rays.  Pro-calcitonin however is negative.  Leukocytosis is likely due to chronic steroid use.  The possibility of a viral pneumonia is not excluded. P:   Continue current antibiotics for now until signs of improvement.  Would rapidly de-escalate or discontinue antibiotics, particularly if it becomes apparent that volume overload was the cause of her dyspnea.  ENDOCRINE A:   Known type 2 diabetes with steroid-induced hyperglycemia. P:   Continue to manage diabetes with long-acting insulin and mealtime short acting with correction scale.  Would recommend use of high intensity correction sliding scale.  NEUROLOGIC A:   Currently no focal neurological deficits.  At times difficult to engage in appropriate self-care behavior.  I suspect this may be her baseline personality. P:   Judicious use of anxiolytics may be helpful.   FAMILY  - Updates: No family present but engagement of family in encouraging better self-care would be beneficial.  Thank you for the consultation and we will continue to follow this patient with you.   Pulmonary and [place] Pager: [phone]  08/09/2017, 11:08 AM     ADDENDUM:  Patient reassessed in the afternoon after stacked nebulizer treatments and 2 g of magnesium.  While she is still tachypneic, she reports that her breathing has improved.  On examination, she is saturating in the low 90s.  She has still audible wheezing but with much improved air entry.  She has been compliant with therapy today.  Would continue current therapy for now.  Does not require intubation at this time.  Kipp Brood, MD

## 2017-08-09 NOTE — Progress Notes (Signed)
Pt stated that she wanted to get out of the bed to go to the potty. RN and respiratory explained to the pt that she was on BIPAP and that she needed to use the bed pan. She stated she wasn't going to use a bed pan. RN and respiratory left the room and the Pt pulled the bipap mask off and the monitoring cords and went to the bathroom on her own. 15 L [place] was placed on the pt while she was in the bathroom. RT and RN helped Pt back in the bed and we tried to explain to about how sick she is and that she couldn't go to the bathroom. The Pt said she wasn't going to use a bed pan. RT will continue to monitor

## 2017-08-09 NOTE — Progress Notes (Signed)
Pt needing to relax before able to tolerate Bipap mask. Pt given tramadol, ambien, and lorazepam to help with transition to bipap.

## 2017-08-09 NOTE — Progress Notes (Signed)
Pt. This RN that she had to use the bathroom. This RN told her that her work of breathing was too great to walk to the bathroom, But I would happily get her a bedpan. She told me she could not use a bedpan and became very irritable. I continuously explained to the patient that if she were to get up that her work of breathing would be very much compromised if she went to the bathroom.  Pt. Hopped up out of the bed while yelling "I will put up a fight, I don't care what you ask me to do, I'm going to the bathroom anyway" and took all leads off and BIPAP mask and walked to the bathroom. Nursing staff put pt. On high flow nasal cannula while in the bathroom and helped pt back to bed safely.

## 2017-08-10 DIAGNOSIS — J441 Chronic obstructive pulmonary disease with (acute) exacerbation: Principal | ICD-10-CM

## 2017-08-10 LAB — CBC
HEMATOCRIT: 41.7 % (ref 36.0–46.0)
Hemoglobin: 13.2 g/dL (ref 12.0–15.0)
MCH: 27.2 pg (ref 26.0–34.0)
MCHC: 31.7 g/dL (ref 30.0–36.0)
MCV: 86 fL (ref 78.0–100.0)
PLATELETS: 240 10*3/uL (ref 150–400)
RBC: 4.85 MIL/uL (ref 3.87–5.11)
RDW: 17.4 % — AB (ref 11.5–15.5)
WBC: 23.2 10*3/uL — AB (ref 4.0–10.5)

## 2017-08-10 LAB — CREATININE, SERUM: CREATININE: 0.73 mg/dL (ref 0.44–1.00)

## 2017-08-10 LAB — GLUCOSE, CAPILLARY
GLUCOSE-CAPILLARY: 214 mg/dL — AB (ref 65–99)
Glucose-Capillary: 305 mg/dL — ABNORMAL HIGH (ref 65–99)
Glucose-Capillary: 371 mg/dL — ABNORMAL HIGH (ref 65–99)
Glucose-Capillary: 221 mg/dL — ABNORMAL HIGH (ref 65–99)

## 2017-08-10 LAB — IGE: IGE (IMMUNOGLOBULIN E), SERUM: 151 [IU]/mL (ref 6–495)

## 2017-08-10 LAB — PROCALCITONIN

## 2017-08-10 MED ORDER — LIP MEDEX EX OINT
TOPICAL_OINTMENT | CUTANEOUS | Status: AC
  Administered 2017-08-10: 16:00:00
  Filled 2017-08-10: qty 7

## 2017-08-10 MED ORDER — AZITHROMYCIN 250 MG PO TABS
250.0000 mg | ORAL_TABLET | Freq: Every day | ORAL | Status: DC
  Administered 2017-08-11 – 2017-08-12 (×2): 250 mg via ORAL
  Filled 2017-08-10 (×2): qty 1

## 2017-08-10 MED ORDER — LORAZEPAM 1 MG PO TABS
1.0000 mg | ORAL_TABLET | Freq: Three times a day (TID) | ORAL | Status: DC | PRN
  Administered 2017-08-10 (×2): 1 mg via ORAL
  Filled 2017-08-10 (×3): qty 1

## 2017-08-10 MED ORDER — PREDNISONE 20 MG PO TABS
50.0000 mg | ORAL_TABLET | Freq: Every day | ORAL | Status: DC
  Administered 2017-08-10 – 2017-08-11 (×2): 50 mg via ORAL
  Filled 2017-08-10 (×2): qty 2

## 2017-08-10 MED ORDER — FUROSEMIDE 40 MG PO TABS
40.0000 mg | ORAL_TABLET | Freq: Every day | ORAL | Status: DC
  Administered 2017-08-10: 40 mg via ORAL
  Filled 2017-08-10: qty 1

## 2017-08-10 MED ORDER — AZITHROMYCIN 250 MG PO TABS
500.0000 mg | ORAL_TABLET | Freq: Once | ORAL | Status: AC
  Administered 2017-08-10: 500 mg via ORAL
  Filled 2017-08-10: qty 2

## 2017-08-10 MED ORDER — INSULIN GLARGINE 100 UNIT/ML ~~LOC~~ SOLN
20.0000 [IU] | Freq: Every day | SUBCUTANEOUS | Status: DC
  Administered 2017-08-10 – 2017-08-11 (×2): 20 [IU] via SUBCUTANEOUS
  Filled 2017-08-10 (×2): qty 0.2

## 2017-08-10 NOTE — Progress Notes (Signed)
Nurse stated that the doctor wanted the Pt on her [place] and placed in a chair. RT took Pt off of the BIPAP and placed her on 15L [place]. RT will continue to monitor

## 2017-08-10 NOTE — Progress Notes (Signed)
Pt told by Dr Karleen Hampshire to stay sitting in chair until after lunch. Pt refused and got up on her own to go back to bed. Pt pulled off O2 and states if she needs to be intubated that it's ok

## 2017-08-10 NOTE — Progress Notes (Signed)
Pt's daughter brought pt jellybeans, cheese puffs and oatmeal cookies. I educated pt and her boyfriend at bedside on blood sugars and keeping with her carb modified diet and avoiding sugar and carbs. Pt states she understands.

## 2017-08-10 NOTE — Progress Notes (Signed)
PT Cancellation Note  Patient Details Name: April [name] MRN: [id number] DOB: 1970/10/19   Cancelled Treatment:    Reason Eval/Treat Not Completed: Fatigue/lethargy limiting ability to participate(pt declined PT today, she stated she's too tired and in pain. Pt had just returned to bed from the recliner. Will follow. )   [name] 08/10/2017, 12:57 PM [phone]

## 2017-08-10 NOTE — Consult Note (Addendum)
PULMONARY / CRITICAL CARE MEDICINE   Name: April [name] MRN: [id number] DOB: 01-18-1971    ADMISSION DATE:  08/06/2017 CONSULTATION DATE:  08/09/2017  REFERRING MD:  Karleen Hampshire  CHIEF COMPLAINT:  Dyspnea  HISTORY OF PRESENT ILLNESS:   47 year old woman recently discharged from [facility] following LRTI/asthma exacerbation.  States that she was not feeling well even at discharge. Normally followed by Dr Baird Lyons at [facility] and last seen for AECOPD on 07/14/17. History of suboptimal compliance.    Admitted to [facility] on 4/2 and discharged from there 4/10 with diagnosis of AECOPD.    Admitted with same yesterday. Seen at request of Dr Karleen Hampshire who has started antibiotics for HAP, therapy for COPD and diuresis. She has requested PCCM involvement because the patient continues to breath at nearly 50/min.  This has been confirmed by the nurses and RT who have also noticed that the patient readily will get up and go to bathroom unassisted. Patient is refusing BiPAP.   PAST MEDICAL HISTORY :  She  has a past medical history of Acanthosis nigricans, Anxiety, Arthritis, Asthma, COPD (chronic obstructive pulmonary disease) ([place]), Depression, GERD (gastroesophageal reflux disease), PNTIRWER(154.0), Helicobacter pylori (H. pylori) infection, Hypertension, essential, Insomnia, Menorrhagia, Morbid obesity ([place]), OSA on CPAP, Pneumonia (X 1), Seasonal allergies, Shortness of breath, and Tobacco user.  PAST SURGICAL HISTORY: She  has a past surgical history that includes Tubal ligation (1996); Reduction mammaplasty (Bilateral, 09/2011); and Cardioversion (N/A, 05/30/2017).  Allergies  Allergen Reactions  . Contrast Media [Iodinated Diagnostic Agents] Itching    Ct contrast    No current facility-administered medications on file prior to encounter.    Current Outpatient Medications on File Prior to Encounter  Medication Sig  . albuterol (PROVENTIL HFA;VENTOLIN HFA) 108 (90 Base) MCG/ACT inhaler Inhale 2 puffs into the  lungs every 4 (four) hours as needed for wheezing or shortness of breath.  [name] apixaban (ELIQUIS) 5 MG TABS tablet Take 1 tablet (5 mg total) by mouth 2 (two) times daily.  . budesonide-formoterol (SYMBICORT) 160-4.5 MCG/ACT inhaler Inhale 2 puffs into the lungs 2 (two) times daily.  . busPIRone (BUSPAR) 7.5 MG tablet Take 1 tablet (7.5 mg total) 2 (two) times daily by mouth.  . butalbital-acetaminophen-caffeine (FIORICET, ESGIC) 50-325-40 MG tablet Take 1-2 tablets by mouth every 6 (six) hours as needed for headache.  . diltiazem (CARDIZEM CD) 360 MG 24 hr capsule Take 1 capsule (360 mg total) by mouth daily.  . famotidine (PEPCID) 20 MG tablet Take 1 tablet (20 mg total) by mouth 2 (two) times daily.  . fluticasone (FLONASE) 50 MCG/ACT nasal spray Place 2 sprays into both nostrils daily.  [name] glipiZIDE (GLUCOTROL) 5 MG tablet Take 0.5 tablets (2.5 mg total) by mouth 2 (two) times daily before a meal.  . guaiFENesin (MUCINEX) 600 MG 12 hr tablet Take 1 tablet (600 mg total) by mouth 2 (two) times daily.  [name] ipratropium-albuterol (DUONEB) 0.5-2.5 (3) MG/3ML SOLN Take 3 mLs by nebulization every 6 (six) hours.  [name] losartan (COZAAR) 100 MG tablet Take 1 tablet (100 mg total) by mouth daily.  . metoprolol tartrate (LOPRESSOR) 100 MG tablet Take 1 tablet (100 mg total) by mouth 2 (two) times daily.  . mometasone-formoterol (DULERA) 200-5 MCG/ACT AERO Inhale 2 puffs into the lungs 2 (two) times daily.  . montelukast (SINGULAIR) 10 MG tablet Take 1 tablet (10 mg total) by mouth at bedtime.  . sertraline (ZOLOFT) 100 MG tablet Take 1 tablet (100 mg total) by mouth  daily.  . spironolactone (ALDACTONE) 100 MG tablet Take 1 tablet (100 mg total) by mouth daily.  . furosemide (LASIX) 40 MG tablet Take 1 tablet (40 mg total) by mouth daily.  . nicotine (NICODERM CQ - DOSED IN MG/24 HR) 7 mg/24hr patch Place 1 patch (7 mg total) onto the skin daily.  [name] nystatin (MYCOSTATIN) 100000 UNIT/ML suspension Take 5 mLs  (500,000 Units total) by mouth 4 (four) times daily for 6 days.  . pantoprazole (PROTONIX) 40 MG tablet Take 1 tablet (40 mg total) by mouth 2 (two) times daily.  . predniSONE (DELTASONE) 20 MG tablet Take 1 tablet (20 mg total) by mouth daily with breakfast.    FAMILY HISTORY:  Her indicated that her mother is alive. She indicated that her father is alive. She indicated that the status of her maternal grandmother is unknown. She indicated that the status of her daughter is unknown. She indicated that the status of her paternal aunt is unknown.   SOCIAL HISTORY: She  reports that she quit smoking about 2 years ago. Her smoking use included cigarettes. She has a 13.00 pack-year smoking history. She has never used smokeless tobacco. She reports that she does not drink alcohol or use drugs.  REVIEW OF SYSTEMS:   ROS is limited due to limited patient participation in encounter.  SUBJECTIVE:   Patient has been tolerating BiPAP overnight. Still having periodic desaturation into 80's and tachypneic, but she states that she feels breathing has improved.   VITAL SIGNS: BP (!) 192/90   Pulse 91   Temp (!) 97.5 F (36.4 C) (Oral)   Resp (!) 44   Ht 5\' 6"  (1.676 m)   Wt (!) 344 lb 6.4 oz (156.2 kg)   SpO2 94%   BMI 55.59 kg/m   HEMODYNAMICS: Currently on no vasoactive infusions    VENTILATOR SETTINGS: FiO2 (%):  [70 %] 70 %  INTAKE / OUTPUT: I/O last 3 completed shifts: In: 4300 [P.O.:600; Other:2900; IV Piggyback:800] Out: - Positive fluid balance  PHYSICAL EXAMINATION: General: Morbidly obese woman with hirsutism.  Moderate respiratory distress with shallow breathing.  No abdominal paradox.  Lying on her left side.  At times she is slow to respond to questions but then is able to speak in full sentences at a rapid clip.   Neuro: Generally awake and able to follow commands and converse intelligently.  No focal deficits identified. HEENT:  Cushingoid facies. Cardiovascular: JVP is  not visible.  Mild peripheral edema.  Sounds are distant but otherwise unremarkable.  Strong peripheral pulses with normal capillary refill. Lungs: Air entry much improved. Continues to have diffuse wheezing but pitch has decreased (suggestive of decreased airway obstruction). Abdomen: Large pannus.  Abdomen otherwise soft nontender. Musculoskeletal: No evidence of active joint disease Skin: Skin is intact  LABS:  BMET Recent Labs  Lab 08/08/17 0512 08/09/17 1014 08/09/17 1418 08/10/17 0319  NA 134* 133* 135  --   K 4.0 3.9 4.0  --   CL 97* 95* 95*  --   CO2 25 23 26   --   BUN 14 19 20   --   CREATININE 0.85 0.72 0.80 0.73  GLUCOSE 447* 295* 272*  --     Electrolytes Recent Labs  Lab 08/08/17 0512 08/09/17 1014 08/09/17 1418  CALCIUM 8.3* 8.4* 8.6*    CBC Recent Labs  Lab 08/08/17 0512 08/09/17 1014 08/10/17 0319  WBC 27.5* 24.7* 23.2*  HGB 12.1 13.0 13.2  HCT 38.6 40.5 41.7  PLT  267 262 240    Coag's No results for input(s): APTT, INR in the last 168 hours.  Sepsis Markers Recent Labs  Lab 08/06/17 2128 08/06/17 2346 08/08/17 1849 08/09/17 1014 08/10/17 0319  LATICACIDVEN 3.88* 3.38* 1.9  --   --   PROCALCITON  --   --   --  <0.10 <0.10    ABG Recent Labs  Lab 08/05/17 1245 08/08/17 1830 08/09/17 0855  PHART 7.425 7.456* 7.453*  PCO2ART 46.8 38.7 38.5  PO2ART 71.1* 67.5* 57.7*    Liver Enzymes Recent Labs  Lab 08/06/17 2118  AST 25  ALT 28  ALKPHOS 89  BILITOT 0.6  ALBUMIN 3.2*    Cardiac Enzymes No results for input(s): TROPONINI, PROBNP in the last 168 hours.  Glucose Recent Labs  Lab 08/08/17 2117 08/09/17 0718 08/09/17 1101 08/09/17 1638 08/09/17 2106 08/10/17 0747  GLUCAP 147* 228* 285* 223* 222* 305*    Imaging  CXR 08/08/17: report shows Increased diffuse bilateral airspace disease, suspicious for pulmonary edema, and small right pleural effusion.  Small lung volumes.   CULTURES: No new culture  data  ANTIBIOTICS: On cefepime and vancomycin.  SIGNIFICANT EVENTS: Admitted to ICU. Received iv magnesium and stacked albuterol treatments with improvement in dyspnea.   LINES/TUBES: Urinary catheter and 2 peripheral IVs.  DISCUSSION: 47 year old woman with mixed respiratory disease related to morbid obesity.  He has severe baseline dyspnea and is historically noncompliant with medical therapy.  It is not clear to me different her breathing is from her baseline.  While she appears dyspneic she is also able to speak normally.   General improvement in dyspnea.   ASSESSMENT / PLAN:  PULMONARY A: Acute exacerbation of COPD with possible community acquired pneumonia.  BNP of 42 argues against significant volume overload.  P:   Continue current therapy for COPD exacerbation and pneumonia.  Begin to taper steroids We will consider making arrangements to administer her Xolair as an inpatient.  CARDIOVASCULAR A:  Severe hypertension apparently related to hyperaldosteronism, although could also be related to morbid obesity and possible polycystic ovarian and kidney disease were those to be present.  She carries a history of diastolic heart failure although her diastolic parameters on her latest echo showed only grade 1 diastolic dysfunction.  This degree of relaxation impairment may not be sufficient to produce significant volume overload. Rate controlled atrial fibrillation. P:  She is already received a dose of diuretics.  Diuresed 2L yesterday and BNP negative.  Continue current home therapy for atrial fibrillation Resume home oral diuretics.  RENAL A:   Renal function is normal at this time with no electrolyte issues.  CO2 is normal suggesting that she is not a chronic CO2 retainer P:   Continue to monitor volume status and diuresis as appropriate.  GASTROINTESTINAL A:   No specific abdominal issues P:   Does not meet criteria for stress ulcer prophylaxis. Advance  diet.  HEMATOLOGIC A:   Normal hemoglobin.   P:  No indications for transfusion  INFECTIOUS A:   Leukocytosis and possible left lower lobe infiltrate.  This was not present on her previous x-rays.  Pro-calcitonin however is negative.  Leukocytosis is likely due to chronic steroid use.  The possibility of a viral pneumonia is not excluded. P:   Step antibiotics down to oral azithromycin which can be continued chronically to prevent further exacerbations. ENDOCRINE A:   Known type 2 diabetes with steroid-induced hyperglycemia. P:   Continue to manage diabetes with long-acting insulin and  mealtime short acting with correction scale.  Would recommend use of high intensity correction sliding scale. Should improve with steroid taper.   NEUROLOGIC A:   Currently no focal neurological deficits.  At times difficult to engage in appropriate self-care behavior.  I suspect this may be her baseline personality. P:   Judicious use of anxiolytics may be helpful.   FAMILY  - Updates: No family present but engagement of family in encouraging better self-care would be beneficial.  Thank you for the consultation and we will continue to follow this patient with you.   Pulmonary and [place] Pager: [phone]  08/10/2017, 8:19 AM

## 2017-08-10 NOTE — Progress Notes (Signed)
PROGRESS NOTE    RAGENA FIOLA  QVZ:[id number] DOB: 03-11-1971 DOA: 08/06/2017 PCP: [name], MD    Brief Narrative: April [name]  is a 47 y.o. female, with past medical history significant for chronic severe asthmatic, noncompliance, A. fib, morbid obesity, hypertension, depression obstructive sleep apnea, allergic rhinitis and GERD,  discharged  from our facility after improvement .  Patient was treated appropriately with systemic steroids, supplemental oxygen and bronchodilators and pulmonary cleared the patient.  Patient took her medications and went home where her symptoms started worsening again with fever and chills and she presented to the emergency room .  White blood cell count 21.7, with audible wheezes and rhonchi. She was found to have pneumonia.      Assessment & Plan:   Active Problems:   Morbid obesity with body mass index of 50.0-59.9 in adult [facility])   Essential hypertension   OSA on CPAP   Knee pain, bilateral   Tobacco abuse in remission   Leukocytosis   GERD (gastroesophageal reflux disease)   Moderate persistent asthma with exacerbation   Chronic diastolic CHF (congestive heart failure) (HCC)   SIRS (systemic inflammatory response syndrome) (HCC)   COPD with acute exacerbation ([place])   New onset atrial fibrillation (HCC)   Acute respiratory failure with hypoxia (HCC)   Unspecified atrial fibrillation (HCC)   Acute on chronic respiratory failure with hypoxemia ([place])   HCAP (healthcare-associated pneumonia)   Acute respiratory failure with hypoxia sec to health care associated pneumonia and asthma exacerbation/ COPD exacerbation.  - admitted for IV antibiotics,was on  IV vancomycin, IV cefepime transition to oral zithromycin, duo-nebs every 4 hours.  - she continues to have profusely wheezing, was already on high dose steroids, duo nebs.and dulera  . Changed dulera to brovana and pulmicort.  -  oxygen/ bipap prn  to keep sats greater than 90% - blood  cultures are negative so far.   - pro calcitonin is negataive.  Lactic acid is negative.  Pro bnp is 42.  - transition to oral steroids and oral lasix starting today.      Acute on Chronic diastolic heart failure:  CXR overnight showed pulm edema, started on IV lasix and transferred to step down.she has diuresed about 2 lit yesterday, transition to oral lasix, and monitor.  Pro bnp is 42. , will request for strict intake and output and daily weights.     Paroxysmal atrial fibrillation.  Rate controlled with cardizem and anti coagulation with eliquis.    Tobacco abuse:  On nicotine patch.    Type 2 DM: uncontrolled with hyperglycemia: CBG (last 3)  Recent Labs    08/09/17 1638 08/09/17 2106 08/10/17 0747  GLUCAP 223* 222* 305*   Probably sec to high dose steroids.  hgba1c is around 7.  On 5 units of novolog TIDAC Added 15 units of lantus.  Restart glipizide today.   Leukocytosis: Possibly from infection and steroids.    Mild normocytic anemia:  Hemoglobin around 11.   Hypertension;  - sub optimally controlled. Prn hydralazine. Resume home losartan, metoprolol, lasix and spironolactone.   GERD: stable.   OSA: on CPAP.she is very non compliant to CPAP / bipap.  Will need education.    DVT prophylaxis: eliquis.  Code Status: full code Family Communication: none at bedside.  Disposition Plan: remain in step down.    Consultants:   PCCM   Procedures: none.    Antimicrobials: vancomycin and cefepime since admission till 4/13.   zithromax from 4/14/ .  Subjective: Wants to go back to bed, insisted that she should sit in the chair for a few hours before going to bed.   Objective: Vitals:   08/10/17 0700 08/10/17 0722 08/10/17 0800 08/10/17 0933  BP: (!) 192/90  (!) 183/92 (!) 168/97  Pulse: 91  99 (!) 104  Resp: (!) 44  (!) 43 (!) 34  Temp:   97.9 F (36.6 C)   TempSrc:   Axillary   SpO2: 90% 94% (!) 87% 98%  Weight:      Height:         Intake/Output Summary (Last 24 hours) at 08/10/2017 0955 Last data filed at 08/10/2017 0900 Gross per 24 hour  Intake 3840 ml  Output 200 ml  Net 3640 ml   Filed Weights   08/06/17 2142 08/07/17 0230  Weight: (!) 152.4 kg (336 lb) (!) 156.2 kg (344 lb 6.4 oz)    Examination:  General exam: alert and in chair on 12 lit of [place] Oxygen.  Respiratory system: diffuse bilateral coarse breath sounds, no crackles, audible wheezing.  Cardiovascular system: S1 & S2 heard, tachycardia,  No JVD, murmurs, trace pedal edema.  Gastrointestinal system: Abdomen is soft non tender non distended bowel sounds heard.  Central nervous system: Alert and oriented. Non focal.  Extremities:no cyanosis or clubbing.  Skin: No rashes, lesions or ulcers Psychiatry:  Anxious.     Data Reviewed: I have personally reviewed following labs and imaging studies  CBC: Recent Labs  Lab 08/06/17 2118 08/07/17 0517 08/08/17 0512 08/09/17 1014 08/10/17 0319  WBC 21.7* 22.3* 27.5* 24.7* 23.2*  NEUTROABS 20.0*  --   --  23.9*  --   HGB 13.3 11.9* 12.1 13.0 13.2  HCT 41.9 38.8 38.6 40.5 41.7  MCV 87.1 88.2 86.7 86.0 86.0  PLT 357 308 267 262 465   Basic Metabolic Panel: Recent Labs  Lab 08/06/17 2118 08/07/17 0517 08/08/17 0512 08/09/17 1014 08/09/17 1418 08/10/17 0319  NA 140 136 134* 133* 135  --   K 4.3 3.7 4.0 3.9 4.0  --   CL 101 98* 97* 95* 95*  --   CO2 25 22 25 23 26   --   GLUCOSE 237* 303* 447* 295* 272*  --   BUN 18 15 14 19 20   --   CREATININE 0.88 0.87 0.85 0.72 0.80 0.73  CALCIUM 8.7* 8.0* 8.3* 8.4* 8.6*  --    GFR: Estimated Creatinine Clearance: 139 mL/min (by C-G formula based on SCr of 0.73 mg/dL). Liver Function Tests: Recent Labs  Lab 08/06/17 2118  AST 25  ALT 28  ALKPHOS 89  BILITOT 0.6  PROT 6.6  ALBUMIN 3.2*   No results for input(s): LIPASE, AMYLASE in the last 168 hours. No results for input(s): AMMONIA in the last 168 hours. Coagulation Profile: No results  for input(s): INR, PROTIME in the last 168 hours. Cardiac Enzymes: No results for input(s): CKTOTAL, CKMB, CKMBINDEX, TROPONINI in the last 168 hours. BNP (last 3 results) No results for input(s): PROBNP in the last 8760 hours. HbA1C: No results for input(s): HGBA1C in the last 72 hours. CBG: Recent Labs  Lab 08/09/17 0718 08/09/17 1101 08/09/17 1638 08/09/17 2106 08/10/17 0747  GLUCAP 228* 285* 223* 222* 305*   Lipid Profile: No results for input(s): CHOL, HDL, LDLCALC, TRIG, CHOLHDL, LDLDIRECT in the last 72 hours. Thyroid Function Tests: No results for input(s): TSH, T4TOTAL, FREET4, T3FREE, THYROIDAB in the last 72 hours. Anemia Panel: No results for input(s): VITAMINB12, FOLATE,  FERRITIN, TIBC, IRON, RETICCTPCT in the last 72 hours. Sepsis Labs: Recent Labs  Lab 08/06/17 2128 08/06/17 2346 08/08/17 1849 08/09/17 1014 08/10/17 0319  PROCALCITON  --   --   --  <0.10 <0.10  LATICACIDVEN 3.88* 3.38* 1.9  --   --     Recent Results (from the past 240 hour(s))  Blood Culture (routine x 2)     Status: None (Preliminary result)   Collection Time: 08/06/17  9:18 PM  Result Value Ref Range Status   Specimen Description   Final    BLOOD LEFT ANTECUBITAL Performed at [facility], [place] [address] [place], [place] [address]    Special Requests   Final    BOTTLES DRAWN AEROBIC AND ANAEROBIC Blood Culture adequate volume Performed at [facility] [address] [place], [place] [address]    Culture   Final    NO GROWTH 2 DAYS Performed at [facility] [address] [place], [place] [address]    Report Status PENDING  Incomplete  Blood Culture (routine x 2)     Status: None (Preliminary result)   Collection Time: 08/06/17  9:18 PM  Result Value Ref Range Status   Specimen Description   Final    BLOOD RIGHT HAND Performed at [facility], [place] [address], [place], [place] [address]    Special Requests   Final     BOTTLES DRAWN AEROBIC AND ANAEROBIC Blood Culture results may not be optimal due to an inadequate volume of blood received in culture bottles Performed at [facility] [address], [place], [place] [address]    Culture   Final    NO GROWTH 2 DAYS Performed at [facility] [address] [place], [place] [address]    Report Status PENDING  Incomplete  MRSA PCR Screening     Status: Abnormal   Collection Time: 08/08/17 10:27 PM  Result Value Ref Range Status   MRSA by PCR POSITIVE (A) NEGATIVE Final    Comment:        The GeneXpert MRSA Assay (FDA approved for NASAL specimens only), is one component of a comprehensive MRSA colonization surveillance program. It is not intended to diagnose MRSA infection nor to guide or monitor treatment for MRSA infections. RESULT CALLED TO, READ BACK BY AND VERIFIED WITH: [facility] 08/09/17 @0258  BY V.WILKINS Performed at [facility], [place] [address] [place], [place] [address]          Radiology Studies: Dg Chest 2 View  Result Date: 08/08/2017 CLINICAL DATA:  Acute onset shortness of breath today. COPD. Former smoker. EXAM: CHEST - 2 VIEW COMPARISON:  08/06/2017 FINDINGS: Heart size remains normal. Increased diffuse bilateral pulmonary airspace disease is seen, suspicious for pulmonary edema. Probable small right pleural effusion. IMPRESSION: Increased diffuse bilateral airspace disease, suspicious for pulmonary edema, and small right pleural effusion. Electronically Signed   By: Earle Gell M.D.   On: 08/08/2017 19:23        Scheduled Meds: . apixaban  5 mg Oral BID  . arformoterol  15 mcg Nebulization BID  . [START ON 08/11/2017] azithromycin  250 mg Oral Daily  . budesonide (PULMICORT) nebulizer solution  0.25 mg Nebulization BID  . busPIRone  7.5 mg Oral BID  . Chlorhexidine Gluconate Cloth  6 each Topical Q0600  . diltiazem  360 mg Oral Daily  . fluticasone  2 spray Each Nare  Daily  . furosemide  40 mg Oral Daily  . guaiFENesin  600 mg Oral BID  . insulin aspart  0-20 Units Subcutaneous TID WC  . insulin aspart  0-5 Units Subcutaneous QHS  . insulin aspart  5 Units Subcutaneous TID WC  . insulin glargine  20 Units Subcutaneous Daily  . ipratropium-albuterol  3 mL Nebulization Q4H  . losartan  100 mg Oral Daily  . metoprolol tartrate  100 mg Oral BID  . montelukast  10 mg Oral QHS  . mupirocin ointment  1 application Nasal BID  . nicotine  21 mg Transdermal Daily  . nystatin  5 mL Oral QID  . pantoprazole  40 mg Oral BID  . predniSONE  50 mg Oral Q breakfast  . sertraline  100 mg Oral Daily  . spironolactone  100 mg Oral Daily   Continuous Infusions:    LOS: 4 days    Time spent: 32 minutes.     Hosie Poisson, MD [facility] Pager [phone]  If 7PM-7AM, please contact night-coverage [email or web address] Password [facility] 08/10/2017, 9:55 AM

## 2017-08-11 ENCOUNTER — Inpatient Hospital Stay (HOSPITAL_COMMUNITY): Payer: Medicaid Other | Admitting: Certified Registered"

## 2017-08-11 ENCOUNTER — Encounter (HOSPITAL_COMMUNITY): Payer: Self-pay | Admitting: Certified Registered"

## 2017-08-11 ENCOUNTER — Inpatient Hospital Stay (HOSPITAL_COMMUNITY): Payer: Medicaid Other

## 2017-08-11 ENCOUNTER — Inpatient Hospital Stay: Payer: Medicaid Other | Admitting: Family Medicine

## 2017-08-11 DIAGNOSIS — J9601 Acute respiratory failure with hypoxia: Secondary | ICD-10-CM

## 2017-08-11 LAB — BLOOD GAS, ARTERIAL
Acid-Base Excess: 6.9 mmol/L — ABNORMAL HIGH (ref 0.0–2.0)
Acid-Base Excess: 2.8 mmol/L — ABNORMAL HIGH (ref 0.0–2.0)
BICARBONATE: 28.9 mmol/L — AB (ref 20.0–28.0)
Bicarbonate: 30.2 mmol/L — ABNORMAL HIGH (ref 20.0–28.0)
DELIVERY SYSTEMS: POSITIVE
DRAWN BY: 11249
Drawn by: 11249
Expiratory PAP: 9
FIO2: 80
FIO2: 100
INSPIRATORY PAP: 14
LHR: 14 {breaths}/min
MECHVT: 470 mL
Mode: POSITIVE
O2 Saturation: 88.4 %
O2 Saturation: 96.5 %
PATIENT TEMPERATURE: 99.8
PEEP: 8 cmH2O
PO2 ART: 57.5 mmHg — AB (ref 83.0–108.0)
PO2 ART: 106 mmHg (ref 83.0–108.0)
Patient temperature: 99.2
RATE: 18 resp/min
pCO2 arterial: 39.8 mmHg (ref 32.0–48.0)
pCO2 arterial: 54.4 mmHg — ABNORMAL HIGH (ref 32.0–48.0)
pH, Arterial: 7.495 — ABNORMAL HIGH (ref 7.350–7.450)
pH, Arterial: 7.347 — ABNORMAL LOW (ref 7.350–7.450)

## 2017-08-11 LAB — RESPIRATORY PANEL BY PCR
Adenovirus: NOT DETECTED
Bordetella pertussis: NOT DETECTED
CORONAVIRUS 229E-RVPPCR: NOT DETECTED
CORONAVIRUS NL63-RVPPCR: NOT DETECTED
CORONAVIRUS OC43-RVPPCR: NOT DETECTED
Chlamydophila pneumoniae: NOT DETECTED
Coronavirus HKU1: NOT DETECTED
Influenza A H1 2009: DETECTED — AB
Influenza B: NOT DETECTED
METAPNEUMOVIRUS-RVPPCR: NOT DETECTED
Mycoplasma pneumoniae: NOT DETECTED
PARAINFLUENZA VIRUS 1-RVPPCR: NOT DETECTED
PARAINFLUENZA VIRUS 2-RVPPCR: NOT DETECTED
Parainfluenza Virus 3: NOT DETECTED
Parainfluenza Virus 4: NOT DETECTED
Respiratory Syncytial Virus: NOT DETECTED
Rhinovirus / Enterovirus: NOT DETECTED

## 2017-08-11 LAB — CBC WITH DIFFERENTIAL/PLATELET
BASOS ABS: 0 10*3/uL (ref 0.0–0.1)
BASOS PCT: 0 %
Eosinophils Absolute: 0 10*3/uL (ref 0.0–0.7)
Eosinophils Relative: 0 %
HEMATOCRIT: 37.7 % (ref 36.0–46.0)
HEMOGLOBIN: 11.9 g/dL — AB (ref 12.0–15.0)
LYMPHS PCT: 7 %
Lymphs Abs: 1.4 10*3/uL (ref 0.7–4.0)
MCH: 27.3 pg (ref 26.0–34.0)
MCHC: 31.6 g/dL (ref 30.0–36.0)
MCV: 86.5 fL (ref 78.0–100.0)
MONOS PCT: 1 %
Monocytes Absolute: 0.2 10*3/uL (ref 0.1–1.0)
NEUTROS ABS: 18.3 10*3/uL — AB (ref 1.7–7.7)
Neutrophils Relative %: 92 %
Platelets: 193 10*3/uL (ref 150–400)
RBC: 4.36 MIL/uL (ref 3.87–5.11)
RDW: 17.1 % — ABNORMAL HIGH (ref 11.5–15.5)
WBC: 19.9 10*3/uL — ABNORMAL HIGH (ref 4.0–10.5)

## 2017-08-11 LAB — BASIC METABOLIC PANEL
ANION GAP: 14 (ref 5–15)
BUN: 31 mg/dL — ABNORMAL HIGH (ref 6–20)
CHLORIDE: 96 mmol/L — AB (ref 101–111)
CO2: 26 mmol/L (ref 22–32)
Calcium: 7.9 mg/dL — ABNORMAL LOW (ref 8.9–10.3)
Creatinine, Ser: 1.28 mg/dL — ABNORMAL HIGH (ref 0.44–1.00)
GFR calc Af Amer: 58 mL/min — ABNORMAL LOW (ref >60)
GFR, EST NON AFRICAN AMERICAN: 50 mL/min — AB (ref >60)
GLUCOSE: 255 mg/dL — AB (ref 65–99)
POTASSIUM: 3.4 mmol/L — AB (ref 3.5–5.1)
Sodium: 136 mmol/L (ref 135–145)

## 2017-08-11 LAB — PROCALCITONIN
Procalcitonin: <0.1 ng/mL
Procalcitonin: 0.15 ng/mL

## 2017-08-11 LAB — GLUCOSE, CAPILLARY
GLUCOSE-CAPILLARY: 197 mg/dL — AB (ref 65–99)
GLUCOSE-CAPILLARY: 300 mg/dL — AB (ref 65–99)
Glucose-Capillary: 241 mg/dL — ABNORMAL HIGH (ref 65–99)
Glucose-Capillary: 294 mg/dL — ABNORMAL HIGH (ref 65–99)
Glucose-Capillary: 223 mg/dL — ABNORMAL HIGH (ref 65–99)

## 2017-08-11 LAB — BRAIN NATRIURETIC PEPTIDE: B NATRIURETIC PEPTIDE 5: 73.5 pg/mL (ref 0.0–100.0)

## 2017-08-11 LAB — TROPONIN I: Troponin I: <0.03 ng/mL (ref <0.03)

## 2017-08-11 LAB — CREATININE, SERUM: Creatinine, Ser: 0.73 mg/dL (ref 0.44–1.00)

## 2017-08-11 LAB — LACTIC ACID, PLASMA: LACTIC ACID, VENOUS: 1.2 mmol/L (ref 0.5–1.9)

## 2017-08-11 LAB — STREP PNEUMONIAE URINARY ANTIGEN: Strep Pneumo Urinary Antigen: NEGATIVE

## 2017-08-11 LAB — TRIGLYCERIDES: Triglycerides: 177 mg/dL — ABNORMAL HIGH (ref <150)

## 2017-08-11 MED ORDER — PRO-STAT SUGAR FREE PO LIQD
60.0000 mL | Freq: Two times a day (BID) | ORAL | Status: DC
  Administered 2017-08-11 – 2017-08-17 (×14): 60 mL
  Filled 2017-08-11 (×14): qty 60

## 2017-08-11 MED ORDER — INSULIN ASPART 100 UNIT/ML ~~LOC~~ SOLN
0.0000 [IU] | SUBCUTANEOUS | Status: DC
  Administered 2017-08-11: 4 [IU] via SUBCUTANEOUS
  Administered 2017-08-11 (×2): 11 [IU] via SUBCUTANEOUS
  Administered 2017-08-12: 4 [IU] via SUBCUTANEOUS
  Administered 2017-08-12: 7 [IU] via SUBCUTANEOUS
  Administered 2017-08-13: 4 [IU] via SUBCUTANEOUS
  Administered 2017-08-13 (×2): 7 [IU] via SUBCUTANEOUS
  Administered 2017-08-13 – 2017-08-14 (×3): 4 [IU] via SUBCUTANEOUS
  Administered 2017-08-14: 11 [IU] via SUBCUTANEOUS
  Administered 2017-08-14: 7 [IU] via SUBCUTANEOUS
  Administered 2017-08-14 (×2): 4 [IU] via SUBCUTANEOUS
  Administered 2017-08-14: 7 [IU] via SUBCUTANEOUS
  Administered 2017-08-14 – 2017-08-15 (×3): 4 [IU] via SUBCUTANEOUS
  Administered 2017-08-15: 7 [IU] via SUBCUTANEOUS
  Administered 2017-08-15: 4 [IU] via SUBCUTANEOUS
  Administered 2017-08-15: 7 [IU] via SUBCUTANEOUS
  Administered 2017-08-15 – 2017-08-16 (×2): 4 [IU] via SUBCUTANEOUS
  Administered 2017-08-16: 7 [IU] via SUBCUTANEOUS
  Administered 2017-08-16: 4 [IU] via SUBCUTANEOUS
  Administered 2017-08-16: 3 [IU] via SUBCUTANEOUS
  Administered 2017-08-16: 7 [IU] via SUBCUTANEOUS
  Administered 2017-08-16 – 2017-08-17 (×2): 4 [IU] via SUBCUTANEOUS
  Administered 2017-08-17: 3 [IU] via SUBCUTANEOUS
  Administered 2017-08-17: 4 [IU] via SUBCUTANEOUS
  Administered 2017-08-17: 3 [IU] via SUBCUTANEOUS
  Administered 2017-08-18 (×3): 4 [IU] via SUBCUTANEOUS

## 2017-08-11 MED ORDER — SUCCINYLCHOLINE CHLORIDE 20 MG/ML IJ SOLN
INTRAMUSCULAR | Status: DC | PRN
  Administered 2017-08-11: 100 mg via INTRAVENOUS

## 2017-08-11 MED ORDER — PREDNISONE 20 MG PO TABS: 40.0000 mg | ORAL_TABLET | Freq: Every day | ORAL | Status: DC

## 2017-08-11 MED ORDER — FUROSEMIDE 10 MG/ML IJ SOLN
80.0000 mg | Freq: Three times a day (TID) | INTRAMUSCULAR | Status: DC
  Administered 2017-08-11 (×2): 80 mg via INTRAVENOUS
  Filled 2017-08-11: qty 8

## 2017-08-11 MED ORDER — FAMOTIDINE IN NACL 20-0.9 MG/50ML-% IV SOLN
20.0000 mg | Freq: Two times a day (BID) | INTRAVENOUS | Status: DC
  Administered 2017-08-12 – 2017-08-13 (×3): 20 mg via INTRAVENOUS
  Filled 2017-08-11 (×3): qty 50

## 2017-08-11 MED ORDER — CHLORHEXIDINE GLUCONATE 0.12% ORAL RINSE (MEDLINE KIT)
15.0000 mL | Freq: Two times a day (BID) | OROMUCOSAL | Status: DC
  Administered 2017-08-11 – 2017-08-18 (×15): 15 mL via OROMUCOSAL

## 2017-08-11 MED ORDER — PROPOFOL 10 MG/ML IV BOLUS
INTRAVENOUS | Status: AC
Start: 2017-08-11 — End: ?
  Filled 2017-08-11: qty 20

## 2017-08-11 MED ORDER — MIDAZOLAM HCL 2 MG/2ML IJ SOLN
2.0000 mg | INTRAMUSCULAR | Status: DC | PRN
  Administered 2017-08-11 – 2017-08-13 (×3): 2 mg via INTRAVENOUS
  Filled 2017-08-11 (×5): qty 2

## 2017-08-11 MED ORDER — MIDAZOLAM HCL 2 MG/2ML IJ SOLN
2.0000 mg | INTRAMUSCULAR | Status: DC | PRN
  Administered 2017-08-11 (×2): 2 mg via INTRAVENOUS
  Filled 2017-08-11 (×2): qty 2

## 2017-08-11 MED ORDER — INSULIN ASPART 100 UNIT/ML ~~LOC~~ SOLN
5.0000 [IU] | SUBCUTANEOUS | Status: DC
  Administered 2017-08-11 – 2017-08-18 (×34): 5 [IU] via SUBCUTANEOUS

## 2017-08-11 MED ORDER — FUROSEMIDE 10 MG/ML IJ SOLN
INTRAMUSCULAR | Status: AC
  Filled 2017-08-11: qty 8

## 2017-08-11 MED ORDER — FUROSEMIDE 10 MG/ML IJ SOLN
40.0000 mg | INTRAMUSCULAR | Status: AC
  Administered 2017-08-11: 40 mg via INTRAVENOUS
  Filled 2017-08-11: qty 4

## 2017-08-11 MED ORDER — INSULIN GLARGINE 100 UNIT/ML ~~LOC~~ SOLN
20.0000 [IU] | Freq: Every day | SUBCUTANEOUS | Status: DC
  Filled 2017-08-11 (×2): qty 0.2

## 2017-08-11 MED ORDER — DILTIAZEM 12 MG/ML ORAL SUSPENSION
60.0000 mg | Freq: Four times a day (QID) | ORAL | Status: DC
  Administered 2017-08-12 – 2017-08-13 (×5): 60 mg
  Filled 2017-08-11 (×13): qty 6

## 2017-08-11 MED ORDER — PROPOFOL 1000 MG/100ML IV EMUL
0.0000 ug/kg/min | INTRAVENOUS | Status: DC
  Administered 2017-08-11 (×2): 40 ug/kg/min via INTRAVENOUS
  Administered 2017-08-11: 35 ug/kg/min via INTRAVENOUS
  Administered 2017-08-11: 5 ug/kg/min via INTRAVENOUS
  Administered 2017-08-11: 40 ug/kg/min via INTRAVENOUS
  Administered 2017-08-12 (×4): 50 ug/kg/min via INTRAVENOUS
  Administered 2017-08-12: 40 ug/kg/min via INTRAVENOUS
  Administered 2017-08-12: 45 ug/kg/min via INTRAVENOUS
  Administered 2017-08-12: 25 ug/kg/min via INTRAVENOUS
  Administered 2017-08-13 (×2): 35 ug/kg/min via INTRAVENOUS
  Filled 2017-08-11 (×19): qty 100

## 2017-08-11 MED ORDER — INSULIN ASPART 100 UNIT/ML ~~LOC~~ SOLN: 3.0000 [IU] | SUBCUTANEOUS | Status: DC

## 2017-08-11 MED ORDER — FENTANYL CITRATE (PF) 100 MCG/2ML IJ SOLN
100.0000 ug | INTRAMUSCULAR | Status: DC | PRN
  Administered 2017-08-11 – 2017-08-12 (×4): 100 ug via INTRAVENOUS
  Filled 2017-08-11 (×4): qty 2

## 2017-08-11 MED ORDER — GUAIFENESIN 100 MG/5ML PO SOLN
20.0000 mL | Freq: Three times a day (TID) | ORAL | Status: DC
Start: 2017-08-11 — End: 2017-08-13
  Administered 2017-08-11 – 2017-08-13 (×6): 400 mg
  Filled 2017-08-11 (×2): qty 10
  Filled 2017-08-11: qty 40
  Filled 2017-08-11: qty 20
  Filled 2017-08-11: qty 10
  Filled 2017-08-11 (×2): qty 20

## 2017-08-11 MED ORDER — PANTOPRAZOLE SODIUM 40 MG PO TBEC: 40.0000 mg | DELAYED_RELEASE_TABLET | Freq: Every day | ORAL | Status: DC

## 2017-08-11 MED ORDER — VITAL HIGH PROTEIN PO LIQD
1000.0000 mL | ORAL | Status: DC
  Administered 2017-08-11 – 2017-08-13 (×3): 1000 mL
  Filled 2017-08-11 (×4): qty 1000

## 2017-08-11 MED ORDER — CLONAZEPAM 0.1 MG/ML ORAL SUSPENSION: 0.5000 mg | Freq: Two times a day (BID) | ORAL | Status: DC

## 2017-08-11 MED ORDER — PROPOFOL 10 MG/ML IV BOLUS
INTRAVENOUS | Status: DC | PRN
  Administered 2017-08-11: 50 mg via INTRAVENOUS

## 2017-08-11 MED ORDER — ORAL CARE MOUTH RINSE
15.0000 mL | Freq: Four times a day (QID) | OROMUCOSAL | Status: DC
  Administered 2017-08-11 – 2017-08-18 (×28): 15 mL via OROMUCOSAL

## 2017-08-11 MED ORDER — CLONAZEPAM 0.5 MG PO TABS
0.5000 mg | ORAL_TABLET | Freq: Two times a day (BID) | ORAL | Status: DC
  Administered 2017-08-11 – 2017-08-13 (×5): 0.5 mg
  Filled 2017-08-11 (×5): qty 1

## 2017-08-11 MED ORDER — PANTOPRAZOLE SODIUM 40 MG IV SOLR
40.0000 mg | Freq: Every day | INTRAVENOUS | Status: DC
  Administered 2017-08-11: 40 mg via INTRAVENOUS
  Filled 2017-08-11 (×2): qty 40

## 2017-08-11 MED ORDER — INSULIN ASPART 100 UNIT/ML ~~LOC~~ SOLN: 5.0000 [IU] | Freq: Four times a day (QID) | SUBCUTANEOUS | Status: DC

## 2017-08-11 MED ORDER — PRO-STAT SUGAR FREE PO LIQD
30.0000 mL | Freq: Every day | ORAL | Status: DC
  Administered 2017-08-11 – 2017-08-13 (×3): 30 mL
  Filled 2017-08-11 (×3): qty 30

## 2017-08-11 MED ORDER — BUDESONIDE 0.5 MG/2ML IN SUSP
0.5000 mg | Freq: Two times a day (BID) | RESPIRATORY_TRACT | Status: DC
  Administered 2017-08-11 – 2017-08-13 (×4): 0.5 mg via RESPIRATORY_TRACT
  Filled 2017-08-11 (×4): qty 2

## 2017-08-11 MED ORDER — FENTANYL CITRATE (PF) 100 MCG/2ML IJ SOLN
100.0000 ug | INTRAMUSCULAR | Status: AC | PRN
  Administered 2017-08-11 (×2): 100 ug via INTRAVENOUS
  Filled 2017-08-11 (×2): qty 2

## 2017-08-11 NOTE — Anesthesia Procedure Notes (Addendum)
Procedure Name: Intubation Date/Time: 08/11/2017 3:32 AM Performed by: Cynda Familia, CRNA Pre-anesthesia Checklist: Patient identified, Emergency Drugs available, Suction available and Patient being monitored Patient Re-evaluated:Patient Re-evaluated prior to induction Oxygen Delivery Method: Circle System Utilized Preoxygenation: Pre-oxygenation with 100% oxygen Induction Type: IV induction Ventilation: Mask ventilation without difficulty Laryngoscope Size: Miller and 2 Grade View: Grade I Tube type: Subglottic suction tube Tube size: 7.5 mm Number of attempts: 1 Airway Equipment and Method: Stylet and Oral airway Placement Confirmation: ETT inserted through vocal cords under direct vision,  positive ETCO2 and breath sounds checked- equal and bilateral Secured at: 21 cm Tube secured with: Tape Dental Injury: Teeth and Oropharynx as per pre-operative assessment  Comments: 0239-Arrived at pt bedside--- called for intubation by Alroy Dust RN-( Dr Russ Halo - ICU MD -not available)- pt on Bipap 80%-- Sat 92%-- RR 30's, B/P 921'J systolic.-Pt awake and alert-- cooperative with AW exam-- no upper teeth -- Mallampati 2-3, Dr Nyoka Cowden called with no response at [place],  Asked RN to obtain additional IV access and to hang NSS IV for induction-Dr Germeroth MDA at [facility] called [phone] advised to cont to call [facility]-- no additional direction, Pt stable-- maintaining sats low 90's, Chart reviewed ( EF- echo, PMHx, labs)[facility] called at [phone], [phone], [phone], [phone], [phone], Germeroth  called at [phone]---advised to cont to call [facility] leadership called at [phone] and [phone]--- advised to call ED MD- RN called ED MD( [place]) and will not assist with intubation--- Dr Chase Caller available remotely and monitoring pt and situation. Cont to try to contact [facility] [phone] and ask Alroy Dust RN to call -- 100% O2 ambu mask applied, suction available.increased B/P cycle, Pt remains stable-- educate pt regarding intubation- risks and benefits-  she agrees to proceed, Chase Caller advises that Dr Russ Halo should be arriving soon-- cont to monitor pt and assist with resp via mask---[place] arrives at 0325-- discuss- IV induction and intubation ( full stomach)with her. She proceeds to administer 50 Dip ( IV SLOW)and 100 mg SUX-- pt intubated with 7.5 taper gard tube with +ETCO2 and + BS bilat. Atraumatic--- bottom teeth as pre laryngoscopy. Pt with hypertension after intubation-- Dr Russ Halo aware and advises that she will treat-- report to RN

## 2017-08-11 NOTE — Progress Notes (Signed)
Inpatient Diabetes Program Recommendations  AACE/ADA: New Consensus Statement on Inpatient Glycemic Control (2015)  Target Ranges:  Prepandial:   less than 140 mg/dL      Peak postprandial:   less than 180 mg/dL (1-2 hours)      Critically ill patients:  140 - 180 mg/dL   Lab Results  Component Value Date   GLUCAP 241 (H) 08/11/2017   HGBA1C 7.2 (H) 07/07/2017    Review of Glycemic Control  Blood sugars > goal of 140-180 mg/dL. ICU Glycemic Control protocol would be beneficial in controlling blood sugars.  Inpatient Diabetes Program Recommendations:     ICU Glycemic Control Protocol with Novolog 3-6-9 units Q4H.   Discussed with RNs.  Will continue to follow.   Thank you. Lorenda Peck, RD, LDN, CDE Inpatient Diabetes Coordinator [phone]

## 2017-08-11 NOTE — Progress Notes (Signed)
RT advanced ET Tube 2 CM.

## 2017-08-11 NOTE — Progress Notes (Signed)
eLink Physician-Brief Progress Note Patient Name: April [name] DOB: June 21, 1970 MRN: [id number]   Date of Service  08/11/2017  HPI/Events of Note  Lab review  Stable on cam care  eICU Interventions  Et tube 5cm above carina - > advance 2cm Check PCT, RVP, urine strep, urine leg, cbc, bmet     Intervention Category Intermediate Interventions: Diagnostic test evaluation  April [name] 08/11/2017, 6:44 AM

## 2017-08-11 NOTE — Progress Notes (Signed)
PT Cancellation Note  Patient Details Name: April [name] MRN: [id number] DOB: 10-Apr-1971   Cancelled Treatment:    Reason Eval/Treat Not Completed: Patient not medically ready--now intubated. Will sign off. Please reorder once pt is medically ready for PT. Thanks    Weston Anna, MPT Pager: [phone]

## 2017-08-11 NOTE — Progress Notes (Signed)
eLink Physician-Brief Progress Note Patient Name: April [name] DOB: 27-Apr-1971 MRN: [id number]   Date of Service  08/11/2017  HPI/Events of Note  Per CRNA - new protocol - needs MD at bedside. EDP will only come for code.  D/w Dr Cecile Sheerer - she is now caught up with crisis at [facility] and will be able to travel to [facility]  Advised CRNA and nursing team -> place 2nd IV (already done), be ready with drugs and position and await Dr Gilford Raid who is on way from [facility] to [place]     Intervention Category Major Interventions: Respiratory failure - evaluation and management  Brand Males 08/11/2017, 3:13 AM

## 2017-08-11 NOTE — Progress Notes (Signed)
Initial Nutrition Assessment  DOCUMENTATION CODES:   Morbid obesity  INTERVENTION:  - Will order TF: Vital High Protein @ 5 mL/hr with 30 mL Prostat once/day and 60 mL Prostat BID (5 packets/day). This regimen + kcal from current Propofol rate will provide 1589 kcal (107% estimated kcal need), 85 grams of protein (57% estimated protein need), and 100 mL free water.  - Will order 15 mL liquid multivitamin per OGT/day.  - Will monitor Propofol rate/changes.   NUTRITION DIAGNOSIS:   Inadequate oral intake related to inability to eat as evidenced by NPO status.   GOAL:   Provide needs based on ASPEN/SCCM guidelines  MONITOR:   Vent status, TF tolerance, Weight trends, Labs  REASON FOR ASSESSMENT:   Ventilator  ASSESSMENT:   47 yr old female with AECOPD and acute hypercapnic respiratory failure failed NIPPV and diuresis. Required intubation.   Pt intubated, sedated, with OGT in place. Per review of abdominal x-ray results report yesterday, the tip of tube is in the proximal stomach. No family/visitors at bedside at this time. RN states no one has been in to visit this shift. CCM in with pt currently. Spoke with Velna Hatchet who states plan to start TF today. Will place order as outlined above. Pt previously on Carb Modified diet and consumed 100% of lunch on 4/13 and 75% of breakfast on 4/14.  Patient is currently intubated on ventilator support MV: 12.9 L/min Temp (24hrs), Avg:98.2 F (36.8 C), Min:97.3 F (36.3 C), Max:98.9 F (37.2 C) Propofol: 36.7 ml/hr (969 kcal).  BP: 112/62 and MAP: 77  Medications reviewed; 20 mg IV Pepcid BID, 80 mg IV Lasix TID, sliding scale Novolog, 20 units Lantus/day, 5 mL Mycostatin PO QID, 50 mg Deltasone per OGT/day, 100 mg Aldactone/day.  Labs reviewed; CBGs: 241 mg/dL this AM, K: 3.4 mmol/L, Cl: 96 mmol/L, BUN: 31 mg/dL, creatinine: 1.28 mg/dL, Ca: 7.7 mg/dL, GFR: 58 mL/min.   Drip: Propofol @ 40 mcg/kg/min.     NUTRITION - FOCUSED PHYSICAL  EXAM:  Completed/assessed; no muscle and no fat wasting, no edema noted at this time.   Diet Order:  Diet NPO time specified  EDUCATION NEEDS:   No education needs have been identified at this time  Skin:  Skin Assessment: Reviewed RN Assessment  Last BM:  4/11  Height:   Ht Readings from Last 1 Encounters:  08/11/17 5\' 6"  (1.676 m)    Weight:   Wt Readings from Last 1 Encounters:  08/07/17 (!) 344 lb 6.4 oz (156.2 kg)    Ideal Body Weight:  59.09 kg  BMI:  Body mass index is 55.59 kg/m.  Estimated Nutritional Needs:   Kcal:  7846-9629 (22-25 kcal/kg IBW)  Protein:  >/= 148 grams (2.5 grams/kg IBW)  Fluid:  >/= 1.6 L/day      Jarome Matin, MS, RD, LDN, [facility] Inpatient Clinical Dietitian Pager # [phone] After hours/weekend pager # [phone]

## 2017-08-11 NOTE — Progress Notes (Signed)
Ms Prazak is now intubated and is on PCCM service. Will sign off.   Hosie Poisson, MD [phone]

## 2017-08-11 NOTE — Significant Event (Addendum)
.. ..    Name: LAMONT GLASSCOCK MRN: [id number] DOB: 07-09-1970    ADMISSION DATE:  08/06/2017  BRIEF HISTORY:46 yr old female with AECOPD and acute hypercapnic respiratory failure failed NIPPV and diuresis. Required intubation   Pt failed NIPPV Required intubation CRNA at head of bed at 3:30 AM Present for the administration of conscious sedation Succinylcholine 100mg  (5 mls) and Propofol 50mg  (5 ml) given for induction Pt intubated using direct laryngoscopy by CRNA with ETT 7.5 at Lip 23 B/l breath sounds and color change on end tidal with respirations in tube confirm position. Will start pt on mechanical ventilation PRVC TV 8cc/kg FiO2 100% PEEP 5. VAP precautions and Sedation with Propofol and Fentanyl CXR to confirm position. ABg in 1 hr post intubation   Signed Dr Seward Carol Pulmonary Critical Care Locums   08/11/2017, 3:38 AM

## 2017-08-11 NOTE — Progress Notes (Addendum)
PULMONARY / CRITICAL CARE MEDICINE   Name: April [name] MRN: [id number] DOB: Aug 28, 1970    ADMISSION DATE:  08/06/2017 CONSULTATION DATE:  08/11/17  REFERRING MD:  Dr. Karleen Hampshire / [facility]   CHIEF COMPLAINT:  Acute Respiratory Failure   BRIEF SUMMARY: 47 y/o F, recently discharged after lower respiratory tract infection/asthma exacerbation, admitted 4/10 with concern for PNA, AF, dCHF and sub-optimal medical compliance.  PCCM consulted on admit for tachypnea (but pt would get up and go to the bathroom unassisted).  Refused bipap. The patient developed increased respiratory distress and was treated with bipap.  ABG on BiPAP 7.495 / 39 / 57 / 88.  She was intubated in the early am of 4/15 by anesthesia.     SUBJECTIVE:  RN reports pt intubated overnight, remains on propofol, vented.  Received multiple doses of lasix overnight. 1.9L neg in last 24 hours / remains positive for admit 4.8L.  Family requesting boyfriend not be allowed to visit.   VITAL SIGNS: BP 119/61   Pulse 83   Temp 98.3 F (36.8 C) (Oral)   Resp (!) 27   Ht 5\' 6"  (1.676 m)   Wt (!) 344 lb 6.4 oz (156.2 kg)   SpO2 94%   BMI 55.59 kg/m   HEMODYNAMICS:    VENTILATOR SETTINGS: Vent Mode: PRVC FiO2 (%):  [70 %-100 %] 100 % Set Rate:  [18 bmp] 18 bmp Vt Set:  [470 mL] 470 mL PEEP:  [8 cmH20] 8 cmH20 Plateau Pressure:  [24 cmH20-27 cmH20] 24 cmH20  INTAKE / OUTPUT: I/O last 3 completed shifts: In: 1302.9 [P.O.:720; I.V.:82.9; IV Piggyback:500] Out: 2775 [Urine:2775]  PHYSICAL EXAMINATION: General:  Morbidly obese female in NAD on vent  Neuro:  Sedate on vent, opens eyes when sedation turned off, shakes head in agitation / coughs, no follow commands HEENT:  ETT, mm pink/moist, no jvd Cardiovascular: s1s2 rrr, no m/r/g  Lungs: Even/non-labored on vent, lungs bilaterally coarse  Abdomen:  Obese, soft, bsx4 active  Musculoskeletal:  No acute deformities  Skin:  Warm/dry, no edema   LABS:  BMET Recent Labs  Lab  08/09/17 1014 08/09/17 1418 08/10/17 0319 08/11/17 0255 08/11/17 0702  NA 133* 135  --   --  136  K 3.9 4.0  --   --  3.4*  CL 95* 95*  --   --  96*  CO2 23 26  --   --  26  BUN 19 20  --   --  31*  CREATININE 0.72 0.80 0.73 0.73 1.28*  GLUCOSE 295* 272*  --   --  255*    Electrolytes Recent Labs  Lab 08/09/17 1014 08/09/17 1418 08/11/17 0702  CALCIUM 8.4* 8.6* 7.9*    CBC Recent Labs  Lab 08/09/17 1014 08/10/17 0319 08/11/17 0702  WBC 24.7* 23.2* 19.9*  HGB 13.0 13.2 11.9*  HCT 40.5 41.7 37.7  PLT 262 240 193    Coag's No results for input(s): APTT, INR in the last 168 hours.  Sepsis Markers Recent Labs  Lab 08/06/17 2346 08/08/17 1849  08/10/17 0319 08/11/17 0255 08/11/17 0702  LATICACIDVEN 3.38* 1.9  --   --   --  1.2  PROCALCITON  --   --    < > <0.10 <0.10 0.15   < > = values in this interval not displayed.    ABG Recent Labs  Lab 08/09/17 0855 08/11/17 0117 08/11/17 0517  PHART 7.453* 7.495* 7.347*  PCO2ART 38.5 39.8 54.4*  PO2ART 57.7* 57.5*  106    Liver Enzymes Recent Labs  Lab 08/06/17 2118  AST 25  ALT 28  ALKPHOS 89  BILITOT 0.6  ALBUMIN 3.2*    Cardiac Enzymes Recent Labs  Lab 08/11/17 0702  TROPONINI <0.03    Glucose Recent Labs  Lab 08/09/17 2106 08/10/17 0747 08/10/17 1125 08/10/17 1523 08/10/17 2107 08/11/17 0818  GLUCAP 222* 305* 371* 221* 214* 241*    Imaging Dg Abd 1 View  Result Date: 08/11/2017 CLINICAL DATA:  Orogastric tube placement. EXAM: ABDOMEN - 1 VIEW COMPARISON:  Radiograph of March 29, 2017. FINDINGS: Distal tip of orogastric tube is seen in proximal stomach. No abnormal bowel dilatation is noted. IMPRESSION: Distal tip of orogastric tube seen in proximal stomach. Electronically Signed   By: Marijo Conception, M.D.   On: 08/11/2017 09:30   Dg Chest Port 1 View  Result Date: 08/11/2017 CLINICAL DATA:  Status post repositioning of endotracheal tube today. EXAM: PORTABLE CHEST 1 VIEW  COMPARISON:  Single-view of the chest earlier today. FINDINGS: The patient's endotracheal tube has been advanced and is now proximal 3 cm above the carina in good position. OG tube tip is in the stomach. Diffuse bilateral airspace disease persists. Aeration in the right lung base has worsened. Heart size is normal. No pneumothorax or pleural effusion. IMPRESSION: ET tube is in good position with the tip 3 cm above the carina. OG tube tip is in the stomach. Extensive bilateral airspace disease persists. Aeration in the right lung base has worsened. Electronically Signed   By: Inge Rise M.D.   On: 08/11/2017 09:33   Dg Chest Port 1 View  Result Date: 08/11/2017 CLINICAL DATA:  Endotracheal tube placement. EXAM: PORTABLE CHEST 1 VIEW COMPARISON:  Chest radiograph performed 08/08/2017 FINDINGS: The patient's endotracheal tube is seen ending 5 cm above the carina. Vascular congestion is noted. Increased interstitial markings raise concern for pulmonary edema. Retrocardiac airspace opacity is somewhat more prominent, and pneumonia cannot be excluded. No significant pleural effusion or pneumothorax is seen. The cardiomediastinal silhouette is borderline normal in size. No acute osseous abnormalities are identified. IMPRESSION: 1. Endotracheal tube seen ending 5 cm above the carina. 2. Vascular congestion. Increased interstitial markings raise concern for pulmonary edema. 3. Increased prominence of retrocardiac airspace opacity. Pneumonia cannot be excluded. Electronically Signed   By: Garald Balding M.D.   On: 08/11/2017 05:01     STUDIES:  ECHO 4/8 >> LVEF estimated at 60-65%, normal wall motion, LA mildly dilated   CULTURES: BCx2 4/10 >>  RVP 4/10 >>  Legionella >> Strep Antigen >>   ANTIBIOTICS: Azithromycin 4/14 >>  SIGNIFICANT EVENTS: 4/10  Admit  4/13  PCCM consulted  4/15  Pt intubated for increased work of breathing  LINES/TUBES: ETT 4/15 >>   DISCUSSION: 47 y/o F admitted with  concern for AECOPD and possible LLL PNA.  Has had significant work of breathing despite BiPAP, hypoxia on ABG.  BNP negative and ECHO as above, doubt CHF.  Had significant work of breathing early 4/15 am and intubated by anesthesia.    ASSESSMENT / PLAN:  PULMONARY A: Acute Hypoxic Respiratory Failure - no PH noted on ECHO 08/04/17 Acute Exacerbation of COPD  Possible CAP  Tobacco Abuse  COPD OSA - mild, as of 2008 sleep study  P:   PRVC 8 cc/kg  Wean PEEP / FiO2 for sats 88-95% Follow cxr post diuresis, monitor LLL infiltrate  Negative troponin on admit argues against clinically significant PE, is on Eliquis  at baseline Continue azithromycin Daily SBT / WUA  Continue brovana + pulmicort Decrease prednisone to 40 mg QD  Continue singulair  CARDIOVASCULAR A:  Severe HTN - ? Related to hyperaldosteronism Diastolic CHF - grade 1 by most recent ECHO Atrial Fibrillation  P:  ICU monitoring  Continue Eliquis  Change cardizem to short acting form to allow for PT administration  Continue spironolactone, cozaar, lopressor    RENAL A:   AKI  Hypokalemia  P:   Trend BMP / urinary output Replace electrolytes as indicated Avoid nephrotoxic agents, ensure adequate renal perfusion Hold further lasix 4/15   GASTROINTESTINAL A:   GERD Morbid Obesity  P:   Pepcid PT for SUP  TF per nutrition > currently receiving enough calories from propofol administration   HEMATOLOGIC A:   Leukocytosis - ? Related to steroids, essentially negative PCT  P:  Trend CBC Continue Eliquis for DVT prophylaxis   INFECTIOUS A:   Possible CAP - ? LLL infiltrate, PCT negative, leukocytosis likely related to steroids  P:   Monitor fever curve / WBC trend  ABX as above  Follow cultures  ENDOCRINE A:   DM with Steroid Induced Hyperglycemia    P:   Reviewed with DM coordinator >  Continue Q4 SSI coverage with moderate scale Lantus 20 units QD Adjusted "meal" coverage to 4 units Q4    NEUROLOGIC A:   ICU Associated Pain  Anxiety / Depression  P:   RASS goal: 0 to -1  Propofol for sedation  PRN fentanyl / versed  Daily SBT / WUA  Early PT efforts Continue zolfort  FAMILY  - Updates:  No family at bedside am 4/15.     CC Time:  15 minutes    [name], NP-C [facility] Pgr: [phone] or if no answer [phone] 08/11/2017, 10:15 AM

## 2017-08-11 NOTE — Progress Notes (Signed)
eLink Physician-Brief Progress Note Patient Name: April [name] DOB: 1970-11-27 MRN: [id number]   Date of Service  08/11/2017  HPI/Events of Note  No response to lasix x 2 RR 35 and still on bipap Mild intermittent agitation +  eICU Interventions  Intubate (CCM MD stuck at [place] with antoher patient decompensating) - RN to call anehstheia     Intervention Category Major Interventions: Airway management;Respiratory failure - evaluation and management  Brand Males 08/11/2017, 2:29 AM

## 2017-08-11 NOTE — Progress Notes (Signed)
eLink Physician-Brief Progress Note Patient Name: April [name] DOB: 02/28/1971 MRN: [id number]   Date of Service  08/11/2017  HPI/Events of Note  Call from RN - resp distress  On camera - 80% fio2 , bippa , RR 27-35, shallow and mildly paradoxical, BP high    eICU Interventions  Continue bipap Aggressive lasix Intubate if not better     Intervention Category Major Interventions: Respiratory failure - evaluation and management  [name] 08/11/2017, 12:15 AM

## 2017-08-12 ENCOUNTER — Inpatient Hospital Stay (HOSPITAL_COMMUNITY): Payer: Medicaid Other

## 2017-08-12 LAB — LEGIONELLA PNEUMOPHILA SEROGP 1 UR AG: L. pneumophila Serogp 1 Ur Ag: NEGATIVE

## 2017-08-12 LAB — GLUCOSE, CAPILLARY
GLUCOSE-CAPILLARY: 94 mg/dL (ref 65–99)
GLUCOSE-CAPILLARY: 116 mg/dL — AB (ref 65–99)
GLUCOSE-CAPILLARY: 165 mg/dL — AB (ref 65–99)
GLUCOSE-CAPILLARY: 107 mg/dL — AB (ref 65–99)
Glucose-Capillary: 91 mg/dL (ref 65–99)
Glucose-Capillary: 76 mg/dL (ref 65–99)
Glucose-Capillary: 116 mg/dL — ABNORMAL HIGH (ref 65–99)

## 2017-08-12 LAB — CBC
HCT: 36.6 % (ref 36.0–46.0)
Hemoglobin: 11.5 g/dL — ABNORMAL LOW (ref 12.0–15.0)
MCH: 27.4 pg (ref 26.0–34.0)
MCHC: 31.4 g/dL (ref 30.0–36.0)
MCV: 87.4 fL (ref 78.0–100.0)
PLATELETS: 196 10*3/uL (ref 150–400)
RBC: 4.19 MIL/uL (ref 3.87–5.11)
RDW: 17.4 % — ABNORMAL HIGH (ref 11.5–15.5)
WBC: 17.9 10*3/uL — AB (ref 4.0–10.5)

## 2017-08-12 LAB — CULTURE, BLOOD (ROUTINE X 2)
Culture: NO GROWTH
Culture: NO GROWTH
SPECIAL REQUESTS: ADEQUATE

## 2017-08-12 LAB — BASIC METABOLIC PANEL
ANION GAP: 17 — AB (ref 5–15)
BUN: 67 mg/dL — ABNORMAL HIGH (ref 6–20)
CALCIUM: 8 mg/dL — AB (ref 8.9–10.3)
CO2: 23 mmol/L (ref 22–32)
Chloride: 97 mmol/L — ABNORMAL LOW (ref 101–111)
Creatinine, Ser: 3.34 mg/dL — ABNORMAL HIGH (ref 0.44–1.00)
GFR calc Af Amer: 18 mL/min — ABNORMAL LOW (ref >60)
GFR, EST NON AFRICAN AMERICAN: 16 mL/min — AB (ref >60)
GLUCOSE: 96 mg/dL (ref 65–99)
POTASSIUM: 3.3 mmol/L — AB (ref 3.5–5.1)
SODIUM: 137 mmol/L (ref 135–145)

## 2017-08-12 LAB — MAGNESIUM: MAGNESIUM: 2.7 mg/dL — AB (ref 1.7–2.4)

## 2017-08-12 LAB — PHOSPHORUS: PHOSPHORUS: 7.2 mg/dL — AB (ref 2.5–4.6)

## 2017-08-12 MED ORDER — FENTANYL BOLUS VIA INFUSION
50.0000 ug | INTRAVENOUS | Status: DC | PRN
  Administered 2017-08-13: 50 ug via INTRAVENOUS
  Filled 2017-08-12: qty 50

## 2017-08-12 MED ORDER — LACTATED RINGERS IV SOLN
Freq: Once | INTRAVENOUS | Status: AC
  Administered 2017-08-12: 15:00:00 via INTRAVENOUS

## 2017-08-12 MED ORDER — METOPROLOL TARTRATE 12.5 MG HALF TABLET
12.5000 mg | ORAL_TABLET | Freq: Two times a day (BID) | ORAL | Status: DC
  Administered 2017-08-12 – 2017-08-17 (×11): 12.5 mg via ORAL
  Filled 2017-08-12 (×11): qty 1

## 2017-08-12 MED ORDER — FENTANYL 2500MCG IN NS 250ML (10MCG/ML) PREMIX INFUSION
100.0000 ug/h | INTRAVENOUS | Status: DC
  Administered 2017-08-12: 50 ug/h via INTRAVENOUS
  Administered 2017-08-13: 125 ug/h via INTRAVENOUS
  Administered 2017-08-14: 100 ug/h via INTRAVENOUS
  Administered 2017-08-15: 125 ug/h via INTRAVENOUS
  Administered 2017-08-16: 150 ug/h via INTRAVENOUS
  Administered 2017-08-16: 250 ug/h via INTRAVENOUS
  Administered 2017-08-18: 150 ug/h via INTRAVENOUS
  Filled 2017-08-12 (×10): qty 250

## 2017-08-12 MED ORDER — AZITHROMYCIN 250 MG PO TABS
500.0000 mg | ORAL_TABLET | Freq: Every day | ORAL | Status: DC
  Administered 2017-08-13 – 2017-08-18 (×6): 500 mg via ORAL
  Filled 2017-08-12 (×7): qty 2

## 2017-08-12 MED ORDER — AZITHROMYCIN 250 MG PO TABS
250.0000 mg | ORAL_TABLET | Freq: Once | ORAL | Status: AC
  Administered 2017-08-12: 250 mg via ORAL
  Filled 2017-08-12: qty 1

## 2017-08-12 MED ORDER — SODIUM CHLORIDE 0.9 % IV BOLUS
500.0000 mL | Freq: Once | INTRAVENOUS | Status: AC
Start: 2017-08-12 — End: 2017-08-12
  Administered 2017-08-12: 500 mL via INTRAVENOUS

## 2017-08-12 MED ORDER — INSULIN GLARGINE 100 UNIT/ML ~~LOC~~ SOLN
10.0000 [IU] | Freq: Every day | SUBCUTANEOUS | Status: DC
Start: 2017-08-12 — End: 2017-08-14
  Administered 2017-08-12 – 2017-08-13 (×2): 10 [IU] via SUBCUTANEOUS
  Filled 2017-08-12 (×3): qty 0.1

## 2017-08-12 MED ORDER — DEXMEDETOMIDINE HCL IN NACL 200 MCG/50ML IV SOLN
0.4000 ug/kg/h | INTRAVENOUS | Status: DC
  Administered 2017-08-12: 0.4 ug/kg/h via INTRAVENOUS
  Filled 2017-08-12 (×2): qty 50

## 2017-08-12 MED ORDER — SODIUM CHLORIDE 0.9 % IV SOLN
0.0000 ug/h | INTRAVENOUS | Status: DC
  Filled 2017-08-12: qty 50

## 2017-08-12 MED ORDER — OSELTAMIVIR PHOSPHATE 30 MG PO CAPS
30.0000 mg | ORAL_CAPSULE | Freq: Two times a day (BID) | ORAL | Status: DC
  Administered 2017-08-12 – 2017-08-15 (×7): 30 mg via ORAL
  Filled 2017-08-12 (×7): qty 1

## 2017-08-12 MED ORDER — SODIUM CHLORIDE 0.9 % IV BOLUS: 500.0000 mL | Freq: Once | INTRAVENOUS | Status: DC

## 2017-08-12 NOTE — Progress Notes (Signed)
Pt BP labile due to sedation. When sedation is titrated down, BP is stable and pt is fighting ventilator and agitated. When sedation is titrated up pt BP is labile. Critical Care MD notified and told to monitor for trend to treat.

## 2017-08-12 NOTE — Progress Notes (Signed)
eLink Physician-Brief Progress Note Patient Name: April [name] DOB: 1971/04/05 MRN: [id number]   Date of Service  08/12/2017  HPI/Events of Note  Hypotensive after fent  eICU Interventions  NS bolus Hold propofol Dc losartan Hold metoprolol      Intervention Category Intermediate Interventions: Hypotension - evaluation and management  Marlis Oldaker V. Raziel Koenigs 08/12/2017, 6:51 AM

## 2017-08-12 NOTE — Progress Notes (Addendum)
PULMONARY / CRITICAL CARE MEDICINE   Name: April [name] MRN: [id number] DOB: 04/15/1971    ADMISSION DATE:  08/06/2017 CONSULTATION DATE:  08/11/17  REFERRING MD:  Dr. Karleen Hampshire / [facility]   CHIEF COMPLAINT:  Acute Respiratory Failure   BRIEF SUMMARY: 47 y/o F, recently discharged after lower respiratory tract infection/asthma exacerbation, admitted 4/10 with concern for PNA, AF, dCHF and sub-optimal medical compliance.  PCCM consulted on admit for tachypnea (but pt would get up and go to the bathroom unassisted).  Refused bipap. The patient developed increased respiratory distress and was treated with bipap.  ABG on BiPAP 7.495 / 39 / 57 / 88.  She was intubated in the early am of 4/15 by anesthesia.     SUBJECTIVE:  RN reports hypotension overnight.  BP meds held per [facility].  Propofol briefly held, 500 ml saline bolus given overnight.  BP improved.    VITAL SIGNS: BP (!) 150/73   Pulse 61   Temp 98.1 F (36.7 C) (Axillary)   Resp (!) 33   Ht 5\' 6"  (1.676 m)   Wt (!) 322 lb 8.5 oz (146.3 kg)   LMP  (LMP Unknown)   SpO2 95%   BMI 52.06 kg/m   HEMODYNAMICS:    VENTILATOR SETTINGS: Vent Mode: PRVC FiO2 (%):  [60 %-70 %] 60 % Set Rate:  [18 bmp] 18 bmp Vt Set:  [470 mL] 470 mL PEEP:  [8 cmH20] 8 cmH20 Plateau Pressure:  [10 cmH20-23 cmH20] 20 cmH20  INTAKE / OUTPUT: I/O last 3 completed shifts: In: 1038.7 [I.V.:1016.7; NG/GT:22] Out: 2120 [Urine:2120]  PHYSICAL EXAMINATION: General: obese female lying in bed on vent, NAD HEENT: MM pink/moist, ETT Neuro: sedate, no response to verbal stimuli CV: s1s2 rrr, no m/r/g PULM: even/non-labored, lungs bilaterally coare  XN:TZGY, non-tender, bsx4 active  Extremities: warm/dry, no overt edema  Skin: no rashes or lesions  LABS:  BMET Recent Labs  Lab 08/09/17 1418  08/11/17 0255 08/11/17 0702 08/12/17 0338  NA 135  --   --  136 137  K 4.0  --   --  3.4* 3.3*  CL 95*  --   --  96* 97*  CO2 26  --   --  26 23  BUN 20  --   --   31* 67*  CREATININE 0.80   < > 0.73 1.28* 3.34*  GLUCOSE 272*  --   --  255* 96   < > = values in this interval not displayed.    Electrolytes Recent Labs  Lab 08/09/17 1418 08/11/17 0702 08/12/17 0338  CALCIUM 8.6* 7.9* 8.0*  MG  --   --  2.7*  PHOS  --   --  7.2*    CBC Recent Labs  Lab 08/10/17 0319 08/11/17 0702 08/12/17 0338  WBC 23.2* 19.9* 17.9*  HGB 13.2 11.9* 11.5*  HCT 41.7 37.7 36.6  PLT 240 193 196    Coag's No results for input(s): APTT, INR in the last 168 hours.  Sepsis Markers Recent Labs  Lab 08/06/17 2346 08/08/17 1849  08/10/17 0319 08/11/17 0255 08/11/17 0702  LATICACIDVEN 3.38* 1.9  --   --   --  1.2  PROCALCITON  --   --    < > <0.10 <0.10 0.15   < > = values in this interval not displayed.    ABG Recent Labs  Lab 08/09/17 0855 08/11/17 0117 08/11/17 0517  PHART 7.453* 7.495* 7.347*  PCO2ART 38.5 39.8 54.4*  PO2ART 57.7* 57.5*  106    Liver Enzymes Recent Labs  Lab 08/06/17 2118  AST 25  ALT 28  ALKPHOS 89  BILITOT 0.6  ALBUMIN 3.2*    Cardiac Enzymes Recent Labs  Lab 08/11/17 0702  TROPONINI <0.03    Glucose Recent Labs  Lab 08/11/17 1550 08/11/17 1921 08/11/17 2325 08/12/17 0321 08/12/17 0440 08/12/17 0729  GLUCAP 294* 300* 223* 91 94 76    Imaging Dg Abd 1 View  Result Date: 08/11/2017 CLINICAL DATA:  Orogastric tube placement. EXAM: ABDOMEN - 1 VIEW COMPARISON:  Radiograph of March 29, 2017. FINDINGS: Distal tip of orogastric tube is seen in proximal stomach. No abnormal bowel dilatation is noted. IMPRESSION: Distal tip of orogastric tube seen in proximal stomach. Electronically Signed   By: Marijo Conception, M.D.   On: 08/11/2017 09:30   Dg Chest Port 1 View  Result Date: 08/12/2017 CLINICAL DATA:  Acute respiratory failure with hypoxia EXAM: PORTABLE CHEST 1 VIEW COMPARISON:  08/11/2017 FINDINGS: Endotracheal tube in good position.  NG tube in the stomach. Severe diffuse bilateral airspace disease  unchanged. No pneumothorax or effusion IMPRESSION: Endotracheal tube in good position. Severe diffuse bilateral airspace disease unchanged. Electronically Signed   By: Franchot Gallo M.D.   On: 08/12/2017 07:07   Dg Chest Port 1 View  Result Date: 08/11/2017 CLINICAL DATA:  Status post repositioning of endotracheal tube today. EXAM: PORTABLE CHEST 1 VIEW COMPARISON:  Single-view of the chest earlier today. FINDINGS: The patient's endotracheal tube has been advanced and is now proximal 3 cm above the carina in good position. OG tube tip is in the stomach. Diffuse bilateral airspace disease persists. Aeration in the right lung base has worsened. Heart size is normal. No pneumothorax or pleural effusion. IMPRESSION: ET tube is in good position with the tip 3 cm above the carina. OG tube tip is in the stomach. Extensive bilateral airspace disease persists. Aeration in the right lung base has worsened. Electronically Signed   By: Inge Rise M.D.   On: 08/11/2017 09:33    STUDIES:  ECHO 4/8 >> LVEF estimated at 60-65%, normal wall motion, LA mildly dilated   CULTURES: BCx2 4/10 >>  RVP 4/10 >> Flu A positive  Legionella >> Strep Antigen >> negative  ANTIBIOTICS: Azithromycin 4/14 >> Tamiflu 4/16 >>   SIGNIFICANT EVENTS: 4/10  Admit  4/13  PCCM consulted  4/15  Pt intubated for increased work of breathing  LINES/TUBES: ETT 4/15 >>   DISCUSSION: 47 y/o F admitted with concern for AECOPD and possible LLL PNA.  Has had significant work of breathing despite BiPAP, hypoxia on ABG.  BNP negative and ECHO as above, doubt CHF.  Had significant work of breathing early 4/15 am and intubated by anesthesia.  Diuresed. Developed AKI.  RVP positive for influenza A.    ASSESSMENT / PLAN:  PULMONARY A: Acute Hypoxic Respiratory Failure - no PH noted on ECHO 08/04/17 Influenza A Positive  Diffuse Bilateral Opacities - concern for influenza pneumonitis,  Acute Exacerbation of COPD  Possible CAP   Tobacco Abuse  COPD OSA - mild, as of 2008 sleep study  P:   PRVC 8 cc/kg  Wean PEEP / FiO2 for sats 88-95% Follow intermittent CXR  Continue brovana + pulmicort  Discontinue steroids, no bronchospasm on exam Continue singulair  See ID   CARDIOVASCULAR A:  Hypotension - overnight 4/16 Severe HTN - ? Related to hyperaldosteronism Diastolic CHF - grade 1 by most recent ECHO Atrial Fibrillation  P:  Hold losartan Reduce dose of lopressor to 12.5 mg BID  Received NS bolus overnight  Continue Eliquis for now, if renal function worsens consider lovenox ICU monitoring  Hold spironolactone Continue cardizem 60 mg PT Q6   RENAL A:   AKI  Hypokalemia  P:   Hold diuresis 4/16  LR at 50 ml/hr x1 L Trend BMP / urinary output Replace electrolytes as indicated, hold replacement 4/16 with AKI Avoid nephrotoxic agents, ensure adequate renal perfusion  GASTROINTESTINAL A:   GERD Morbid Obesity  P:   Pepcid for SUP  TF per Nutrition > propofol providing most of caloric need   HEMATOLOGIC A:   Influenza A Positive  Leukocytosis - ? Related to steroids, essentially negative PCT  P:  Trend CBC  Continue Eliquis for DVT prophylaxis  Begin Tamiflu D1/5  INFECTIOUS A:   Possible CAP - ? LLL infiltrate, PCT negative, leukocytosis ? related to steroids  P:   Monitor fever curve / WBC trend  Azithromycin, D3/x Follow cultures   ENDOCRINE A:   DM with Steroid Induced Hyperglycemia    P:   DM Coordinator following  Continue Q4 SSI, resistant scale  Reduce Lantus to 10 units QD with stopping steroids "Meal" coverage 4 units Q4  NEUROLOGIC A:   ICU Associated Pain  Anxiety / Depression  P:   RASS goal: 0 to -1  Propofol for sedation  PRN versed / fentanyl  Daily SBT / WUA  PT efforts  Continue zoloft, buspar    FAMILY  - Updates:  No family at bedside for update am 4/16.  Will attempt to call.    CC Time:  30 minutes    [name], NP-C [place]  [facility] Pgr: [phone] or if no answer [phone] 08/12/2017, 8:31 AM

## 2017-08-12 NOTE — Discharge Summary (Signed)
Physician Discharge Summary    April [name] SKA:[id number] DOB: [birth year] DOA: 07/29/2017     PCP: April Rakes, MD     Admit date: 07/29/2017  Discharge date: 08/05/2017     Time spent: 25 minutes     Recommendations for Outpatient Follow-up:   1.Complete nystatin 08/12/17, also new medication of prednisone this admission   2.Encourage outpatient cessation of smoking, needs also discussion about weight loss procedures   3.Patient will need titration of CPAP as an outpatient and question of Xolair as well as continued smoking cessation discussions-this was discussed with the patient   4.Would recommend continuation of glipizide as an outpatient         Discharge Diagnoses:   Principal Problem:    Acute respiratory failure with hypoxia ([place])  Active Problems:    Essential hypertension    OSA on CPAP    Malignant hypertension due to primary aldosteronism (HCC)    Moderate persistent asthma with exacerbation    Chronic diastolic CHF (congestive heart failure) (HCC)    Unspecified atrial fibrillation [facility])        Discharge Condition: Improved     Diet recommendation: Diabetic heart healthy          Filed Weights        08/02/17 0638   08/05/17 0523    Weight:   (!) 157.5 kg (347 lb 3.2 oz)   (!) 155.7 kg (343 lb 3.2 oz)          47 year old African-American female, HTN, BMI 51, OSA on CPAP,?  COPD, bipolar, recent admission 3/11-3/16 with acute exacerbation of COPD  She has been seen multiple times in the hospital and has had plans for initiation of Xolair and may be titration of CPAP in the outpatient setting  She was hospitalized again 4/2 with acute exacerbation of COPD versus vocal cord dysfunction        Acute respiratory failure secondary to restrictive habitus + underlying COPD asthma-patient will continue oxygen at home  Pulmonology has administered medications continue Pepcid twice daily, PPI twice daily,  Dulera and echo was performed-note that BNP was only 80 and weight is down from 355 on last hospital discharge to current weight of 344  If they feel that she can probably discharge she can go home later today     Candida esophagitis?  Reasonable to complete 7 days of treatment, would not treat empirically beyond that amount of time, prescription given on discharge     Paroxysmal atrial fibrillation chads score >2+ S/B DC CV 05/2017, left lower leg pain without swelling  Do not feel she has a DVT  Discharged back on Eliquis Cardizem and metoprolol on discharge     ?  Heart failure  Patient had echo 10/03/2016 EF 55-60 she looks compensated clinically-if she is not compliant on her CPAP she probably would have pulmonary hypertension and this will not be picked up on her echo secondary to habitus.  Would not discharge patient on Lasix at this time as she is at 344 and her usual weight is 355     Obstructive sleep apnea on CPAP-we will need discussion with outpatient pulmonologist regarding titration of the same     Super morbid obesity BMI >50-high risk for death-she will need discussion in a frank manner with her PCP regarding weight loss strategies inclusive of surgical procedures        Discharge Exam:       Vitals:  08/05/17 0627   08/05/17 0631    BP:            Pulse:   65   71    Resp:            Temp:            SpO2:   100%   100%          General: eomi ncat  Cardiovascular:  s1 s 2no m/r/g  Respiratory: clear no wheeze--transmitted upper resp sounds     Discharge Instructions             Discharge Instructions          Diet - low sodium heart healthy     Complete by:  As directed           Discharge instructions     Complete by:  As directed           You have been prescribed various medications for your breathing issues-I would recommend that you follow-up closely with Dr. Annamaria [name]  in the outpatient setting to titrate your CPAP as well as to use April [name] is extremely important that you stop smoking and that you consider a weight loss procedure to help you lose weight-a large component of your breathing issues are probably secondary to these 2 issues.        Increase activity slowly     Complete by:  As directed                      Allergies as of 08/05/2017                 Reactions         Contrast Media [iodinated Diagnostic Agents]   Itching        Ct contrast                    Medication List          STOP taking these medications      ACCU-CHEK AVIVA PLUS w/Device Kit       ACCU-CHEK SOFTCLIX LANCETS lancets       arformoterol 15 MCG/2ML Nebu  Commonly known as:  BROVANA       blood glucose meter kit and supplies Kit       budesonide 0.25 MG/2ML nebulizer solution  Commonly known as:  PULMICORT       furosemide 40 MG tablet  Commonly known as:  LASIX       glucose blood test strip  Commonly known as:  ACCU-CHEK AVIVA PLUS       loratadine 10 MG tablet  Commonly known as:  CLARITIN       omeprazole 20 MG capsule  Commonly known as:  PRILOSEC  Replaced by:  pantoprazole 40 MG tablet       Vitamin D (Ergocalciferol) 50000 units Caps capsule  Commonly known as:  DRISDOL       zolpidem 10 MG tablet  Commonly known as:  AMBIEN              TAKE these medications      albuterol 108 (90 Base) MCG/ACT inhaler  Commonly known as:  PROVENTIL HFA;VENTOLIN HFA  Inhale 2 puffs into the lungs every 4 (four) hours as needed for wheezing or shortness of breath.       apixaban 5 MG Tabs tablet  Commonly known as:  ELIQUIS  Take 1  tablet (5 mg total) by mouth 2 (two) times daily.       budesonide-formoterol 160-4.5 MCG/ACT inhaler  Commonly known as:  SYMBICORT  Inhale 2 puffs into the lungs 2 (two) times daily.       busPIRone 7.5  MG tablet  Commonly known as:  BUSPAR  Take 1 tablet (7.5 mg total) 2 (two) times daily by mouth.       butalbital-acetaminophen-caffeine 50-325-40 MG tablet  Commonly known as:  FIORICET, ESGIC  Take 1-2 tablets by mouth every 6 (six) hours as needed for headache.       diltiazem 360 MG 24 hr capsule  Commonly known as:  CARDIZEM CD  Take 1 capsule (360 mg total) by mouth daily.       famotidine 20 MG tablet  Commonly known as:  PEPCID  Take 1 tablet (20 mg total) by mouth 2 (two) times daily.       fluticasone 50 MCG/ACT nasal spray  Commonly known as:  FLONASE  Place 2 sprays into both nostrils daily.       glipiZIDE 5 MG tablet  Commonly known as:  GLUCOTROL  Take 0.5 tablets (2.5 mg total) by mouth 2 (two) times daily before a meal.       guaiFENesin 600 MG 12 hr tablet  Commonly known as:  MUCINEX  Take 1 tablet (600 mg total) by mouth 2 (two) times daily.       ipratropium-albuterol 0.5-2.5 (3) MG/3ML Soln  Commonly known as:  DUONEB  Take 3 mLs by nebulization every 6 (six) hours.       losartan 100 MG tablet  Commonly known as:  COZAAR  Take 1 tablet (100 mg total) by mouth daily.       metoprolol tartrate 100 MG tablet  Commonly known as:  LOPRESSOR  Take 1 tablet (100 mg total) by mouth 2 (two) times daily.       mometasone-formoterol 200-5 MCG/ACT Aero  Commonly known as:  DULERA  Inhale 2 puffs into the lungs 2 (two) times daily.       montelukast 10 MG tablet  Commonly known as:  SINGULAIR  Take 1 tablet (10 mg total) by mouth at bedtime.       nicotine 7 mg/24hr patch  Commonly known as:  NICODERM CQ - dosed in mg/24 hr  Place 1 patch (7 mg total) onto the skin daily.       nystatin 100000 UNIT/ML suspension  Commonly known as:  MYCOSTATIN  Take 5 mLs (500,000 Units total) by mouth 4 (four) times daily for 6 days.       pantoprazole 40 MG tablet  Commonly known as:   PROTONIX  Take 1 tablet (40 mg total) by mouth 2 (two) times daily.  Replaces:  omeprazole 20 MG capsule       predniSONE 20 MG tablet  Commonly known as:  DELTASONE  Take 1 tablet (20 mg total) by mouth daily with breakfast.  What changed:    .medication strength   .how much to take        sertraline 100 MG tablet  Commonly known as:  ZOLOFT  Take 1 tablet (100 mg total) by mouth daily.       spironolactone 100 MG tablet  Commonly known as:  ALDACTONE  Take 1 tablet (100 mg total) by mouth daily.                      Allergies  Allergen   Reactions    .   Contrast Media [Iodinated Diagnostic Agents]   Itching            Ct contrast           Follow-up Information            [facility] Follow up on 08/11/2017.     Why:  at 2:00pm for an appointment with Dr Margarita Rana,   Contact information:  [address] [place] [phone]  [phone]                                  The results of significant diagnostics from this hospitalization (including imaging, microbiology, ancillary and laboratory) are listed below for reference.        Significant Diagnostic Studies:    Imaging Results                                        Microbiology:  No results found for this or any previous visit (from the past 240 hour(s)).      Labs:  Basic Metabolic Panel:  Last Labs                                                                                                                                                            Liver Function Tests:   Last Labs         Last Labs         Last Labs        CBC:  Last Labs                                                                                              Cardiac Enzymes:   Last Labs        BNP:  BNP (last 3 results)  Recent Labs (within last 365 days)                                                      ProBNP (last 3 results)  Recent Labs (within last 365 days)          CBG:  Last Labs                                                                                  Signed:     Nita Sells MD    [facility]  08/05/2017, 9:23 AM                   Electronically signed by Nita Sells, MD at 08/05/2017  9:27 AM

## 2017-08-12 NOTE — Progress Notes (Signed)
Nutrition Follow-up  DOCUMENTATION CODES:   Morbid obesity  INTERVENTION:  - Continue current TF regimen: Vital High Protein @ 5 mL/hr with 30 mL Prostat once per day + 60 mL Prostat BID (5 packets total/day). This regimen + kcal from current Propofol rate is providing 1832 kcal (124% estimated kcal need), 85 grams of protein (57% estimated protein need), 346 mg Phos, and 100 mL free water. - Will continue to monitor Propofol rate/changes.   NUTRITION DIAGNOSIS:   Inadequate oral intake related to inability to eat as evidenced by NPO status. -ongoing  GOAL:   Provide needs based on ASPEN/SCCM guidelines -unable to meet for protein d/t current Propofol rate.  MONITOR:   Vent status, TF tolerance, Weight trends, Labs  REASON FOR ASSESSMENT:   Ventilator  ASSESSMENT:   47 yr old female with AECOPD and acute hypercapnic respiratory failure failed NIPPV and diuresis. Required intubation.   BMI remains in morbid obesity range despite weight -22 lbs/9.9 kg from 4/11-4/16. Pt remains intubated, sedated, and with OGT in place. No family/visitors present again this AM. She is receiving TF regimen via OGT: Vital High Protein @ 5 mL/hr with 30 mL Prostat once per day + 60 mL Prostat BID (5 packets total/day) which is providing 620 kcal, 85 grams of protein, and 100 mL free water.  Spoke with RN at bedside who reports current Propofol rate will likely be maintained throughout the day today and overnight. Propofol rate has been fairly stable throughout this shift.   Per review of [name]'s note this AM: bilateral opacities with concern for influenza pneumonitis, possible CAP, hypotension overnight, severe HTN, plan to hold diuretics today, flu A positive.  Patient is currently intubated on ventilator support MV: 12.6 L/min Temp (24hrs), Avg:98.6 F (37 C), Min:98.1 F (36.7 C), Max:99.3 F (37.4 C) Propofol: 45.9 ml/hr (1212 kcal). BP: 128/59 and MAP: 80   Medications reviewed; 20 mg  IV Pepcid BID, sliding scale Novolog, 10 units Lantus/day. Labs reviewed; CBGs: 91, 94, and 76 mg/dL this AM, K: 3.3 mmol/L, Cl: 97 mmol/L, BUN: 67 mg/dL, creatinine: 3.34 mg/dL, Ca: 8 mg/dL, Phos: 7.2 mg/dL, Mg: 2.7 mg/dL, GFR: 18 mL/min.   Drip: Propofol @ 50 mcg/kg/min.    Diet Order:  Diet NPO time specified  EDUCATION NEEDS:   No education needs have been identified at this time  Skin:  Skin Assessment: Reviewed RN Assessment  Last BM:  4/14  Height:   Ht Readings from Last 1 Encounters:  08/12/17 5\' 6"  (1.676 m)    Weight:   Wt Readings from Last 1 Encounters:  08/12/17 (!) 322 lb 8.5 oz (146.3 kg)    Ideal Body Weight:  59.09 kg  BMI:  Body mass index is 52.06 kg/m.  Estimated Nutritional Needs:   Kcal:  3016-0109 (22-25 kcal/kg IBW)  Protein:  >/= 148 grams (2.5 grams/kg IBW)  Fluid:  >/= 1.6 L/day      Jarome Matin, MS, RD, LDN, [facility] Inpatient Clinical Dietitian Pager # [phone] After hours/weekend pager # [phone]

## 2017-08-12 NOTE — Progress Notes (Signed)
eLink Physician-Brief Progress Note Patient Name: April [name] DOB: [birth year] MRN: [id number]   Date of Service  08/12/2017  HPI/Events of Note  Hypotension 68/28 Unable to wean propofol due to agitation  eICU Interventions  Start precedex, come down on fentanyl Keep propofol at 40 mcg/kg/min NS 500 cc bolus     Intervention Category Major Interventions: Hypotension - evaluation and management  Geri Hepler 08/12/2017, 10:10 PM

## 2017-08-13 ENCOUNTER — Inpatient Hospital Stay (HOSPITAL_COMMUNITY): Payer: Medicaid Other

## 2017-08-13 DIAGNOSIS — J80 Acute respiratory distress syndrome: Secondary | ICD-10-CM

## 2017-08-13 LAB — CBC
HEMATOCRIT: 33.2 % — AB (ref 36.0–46.0)
Hemoglobin: 10.4 g/dL — ABNORMAL LOW (ref 12.0–15.0)
MCH: 27.2 pg (ref 26.0–34.0)
MCHC: 31.3 g/dL (ref 30.0–36.0)
MCV: 86.7 fL (ref 78.0–100.0)
Platelets: 149 10*3/uL — ABNORMAL LOW (ref 150–400)
RBC: 3.83 MIL/uL — ABNORMAL LOW (ref 3.87–5.11)
RDW: 17.3 % — AB (ref 11.5–15.5)
WBC: 18.4 10*3/uL — ABNORMAL HIGH (ref 4.0–10.5)

## 2017-08-13 LAB — BASIC METABOLIC PANEL
Anion gap: 14 (ref 5–15)
BUN: 90 mg/dL — AB (ref 6–20)
CALCIUM: 7.4 mg/dL — AB (ref 8.9–10.3)
CO2: 21 mmol/L — ABNORMAL LOW (ref 22–32)
CREATININE: 3.51 mg/dL — AB (ref 0.44–1.00)
Chloride: 104 mmol/L (ref 101–111)
GFR calc Af Amer: 17 mL/min — ABNORMAL LOW (ref >60)
GFR calc non Af Amer: 15 mL/min — ABNORMAL LOW (ref >60)
GLUCOSE: 172 mg/dL — AB (ref 65–99)
Potassium: 3.1 mmol/L — ABNORMAL LOW (ref 3.5–5.1)
Sodium: 139 mmol/L (ref 135–145)

## 2017-08-13 LAB — BLOOD GAS, ARTERIAL
Acid-base deficit: 6.7 mmol/L — ABNORMAL HIGH (ref 0.0–2.0)
Acid-base deficit: 8.5 mmol/L — ABNORMAL HIGH (ref 0.0–2.0)
Acid-base deficit: 8.4 mmol/L — ABNORMAL HIGH (ref 0.0–2.0)
BICARBONATE: 21.3 mmol/L (ref 20.0–28.0)
BICARBONATE: 23.4 mmol/L (ref 20.0–28.0)
Bicarbonate: 21.1 mmol/L (ref 20.0–28.0)
DRAWN BY: 441261
DRAWN BY: 441261
FIO2: 80
FIO2: 100
FIO2: 100
MECHVT: 360 mL
MECHVT: 360 mL
O2 Saturation: 85 %
O2 Saturation: 86.8 %
O2 Saturation: 92.6 %
PATIENT TEMPERATURE: 99.3
PEEP/CPAP: 10 cmH2O
PEEP: 10 cmH2O
PH ART: 7.198 — AB (ref 7.350–7.450)
PH ART: 7.049 — AB (ref 7.350–7.450)
PO2 ART: 75 mmHg — AB (ref 83.0–108.0)
PO2 ART: 82.6 mmHg — AB (ref 83.0–108.0)
Patient temperature: 98.6
Patient temperature: 98.6
RATE: 24 resp/min
RATE: 28 resp/min
pCO2 arterial: 56.8 mmHg — ABNORMAL HIGH (ref 32.0–48.0)
pCO2 arterial: 89 mmHg (ref 32.0–48.0)
pCO2 arterial: 67 mmHg (ref 32.0–48.0)
pH, Arterial: 7.128 — CL (ref 7.350–7.450)
pO2, Arterial: 65.2 mmHg — ABNORMAL LOW (ref 83.0–108.0)

## 2017-08-13 LAB — GLUCOSE, CAPILLARY
GLUCOSE-CAPILLARY: 235 mg/dL — AB (ref 65–99)
Glucose-Capillary: 170 mg/dL — ABNORMAL HIGH (ref 65–99)
Glucose-Capillary: 187 mg/dL — ABNORMAL HIGH (ref 65–99)
Glucose-Capillary: 174 mg/dL — ABNORMAL HIGH (ref 65–99)
Glucose-Capillary: 248 mg/dL — ABNORMAL HIGH (ref 65–99)
Glucose-Capillary: 245 mg/dL — ABNORMAL HIGH (ref 65–99)

## 2017-08-13 LAB — MAGNESIUM: Magnesium: 2.2 mg/dL (ref 1.7–2.4)

## 2017-08-13 LAB — PHOSPHORUS: PHOSPHORUS: 6.8 mg/dL — AB (ref 2.5–4.6)

## 2017-08-13 MED ORDER — FENTANYL CITRATE (PF) 100 MCG/2ML IJ SOLN: 100.0000 ug | Freq: Once | INTRAMUSCULAR | Status: DC

## 2017-08-13 MED ORDER — CISATRACURIUM BESYLATE (PF) 200 MG/20ML IV SOLN
0.5000 ug/kg/min | INTRAVENOUS | Status: DC
  Administered 2017-08-13: 3 ug/kg/min via INTRAVENOUS
  Administered 2017-08-14: 2 ug/kg/min via INTRAVENOUS
  Administered 2017-08-14: 1 ug/kg/min via INTRAVENOUS
  Administered 2017-08-15 – 2017-08-16 (×3): 2 ug/kg/min via INTRAVENOUS
  Administered 2017-08-16: 2.5 ug/kg/min via INTRAVENOUS
  Administered 2017-08-17: 3.5 ug/kg/min via INTRAVENOUS
  Administered 2017-08-17: 2.5 ug/kg/min via INTRAVENOUS
  Administered 2017-08-17 – 2017-08-18 (×2): 3 ug/kg/min via INTRAVENOUS
  Filled 2017-08-13 (×14): qty 20

## 2017-08-13 MED ORDER — SODIUM CHLORIDE 0.9 % IV SOLN
1.0000 g | Freq: Two times a day (BID) | INTRAVENOUS | Status: DC
  Administered 2017-08-14 – 2017-08-15 (×3): 1 g via INTRAVENOUS
  Filled 2017-08-13 (×3): qty 1

## 2017-08-13 MED ORDER — CHLORHEXIDINE GLUCONATE 0.12% ORAL RINSE (MEDLINE KIT): 15.0000 mL | Freq: Two times a day (BID) | OROMUCOSAL | Status: DC

## 2017-08-13 MED ORDER — QUETIAPINE FUMARATE 50 MG PO TABS
50.0000 mg | ORAL_TABLET | Freq: Two times a day (BID) | ORAL | Status: DC
  Administered 2017-08-13 – 2017-08-17 (×10): 50 mg
  Filled 2017-08-13 (×10): qty 1

## 2017-08-13 MED ORDER — FENTANYL CITRATE (PF) 100 MCG/2ML IJ SOLN: 100.0000 ug | Freq: Once | INTRAMUSCULAR | Status: DC | PRN

## 2017-08-13 MED ORDER — POTASSIUM CHLORIDE 20 MEQ PO PACK: 40.0000 meq | PACK | Freq: Once | ORAL | Status: DC

## 2017-08-13 MED ORDER — SODIUM CHLORIDE 0.9 % IV SOLN
2000.0000 mg | Freq: Once | INTRAVENOUS | Status: AC
Start: 2017-08-13 — End: 2017-08-13
  Administered 2017-08-13: 2000 mg via INTRAVENOUS
  Filled 2017-08-13: qty 2000

## 2017-08-13 MED ORDER — SODIUM CHLORIDE 0.9 % IV BOLUS
500.0000 mL | Freq: Once | INTRAVENOUS | Status: AC
  Administered 2017-08-13: 500 mL via INTRAVENOUS

## 2017-08-13 MED ORDER — VANCOMYCIN HCL 10 G IV SOLR
1250.0000 mg | INTRAVENOUS | Status: DC
  Administered 2017-08-15: 1250 mg via INTRAVENOUS
  Filled 2017-08-13: qty 1250

## 2017-08-13 MED ORDER — SODIUM CHLORIDE 0.9 % IV SOLN
0.0000 mg/h | INTRAVENOUS | Status: DC
  Administered 2017-08-13: 2 mg/h via INTRAVENOUS
  Administered 2017-08-14: 4 mg/h via INTRAVENOUS
  Administered 2017-08-14: 2 mg/h via INTRAVENOUS
  Administered 2017-08-15 (×3): 8 mg/h via INTRAVENOUS
  Administered 2017-08-16 (×2): 9 mg/h via INTRAVENOUS
  Administered 2017-08-16 – 2017-08-17 (×3): 8 mg/h via INTRAVENOUS
  Administered 2017-08-17: 4 mg/h via INTRAVENOUS
  Administered 2017-08-18: 3.5 mg/h via INTRAVENOUS
  Administered 2017-08-18: 4 mg/h via INTRAVENOUS
  Filled 2017-08-13 (×15): qty 10

## 2017-08-13 MED ORDER — SODIUM CHLORIDE 0.9 % IV SOLN
100.0000 ug/h | INTRAVENOUS | Status: DC
  Filled 2017-08-13: qty 50

## 2017-08-13 MED ORDER — ROCURONIUM BROMIDE 50 MG/5ML IV SOLN
100.0000 mg | Freq: Once | INTRAVENOUS | Status: AC
  Administered 2017-08-13: 100 mg via INTRAVENOUS

## 2017-08-13 MED ORDER — ORAL CARE MOUTH RINSE: 15.0000 mL | Freq: Four times a day (QID) | OROMUCOSAL | Status: DC

## 2017-08-13 MED ORDER — VANCOMYCIN HCL IN DEXTROSE 1-5 GM/200ML-% IV SOLN: 1000.0000 mg | INTRAVENOUS | Status: DC

## 2017-08-13 MED ORDER — NOREPINEPHRINE 4 MG/250ML-% IV SOLN
0.0000 ug/min | INTRAVENOUS | Status: DC
  Administered 2017-08-13: 4 ug/min via INTRAVENOUS
  Administered 2017-08-13: 40 ug/min via INTRAVENOUS
  Filled 2017-08-13 (×2): qty 250

## 2017-08-13 MED ORDER — SODIUM CHLORIDE 0.9 % IV SOLN: INTRAVENOUS | Status: DC | PRN

## 2017-08-13 MED ORDER — SODIUM CHLORIDE 0.9 % IV SOLN: 1.0000 g | Freq: Two times a day (BID) | INTRAVENOUS | Status: DC

## 2017-08-13 MED ORDER — NOREPINEPHRINE BITARTRATE 1 MG/ML IV SOLN
0.0000 ug/min | INTRAVENOUS | Status: DC
  Administered 2017-08-13: 40 ug/min via INTRAVENOUS
  Filled 2017-08-13: qty 4

## 2017-08-13 MED ORDER — SODIUM BICARBONATE 8.4 % IV SOLN
INTRAVENOUS | Status: DC
  Administered 2017-08-13 – 2017-08-14 (×2): via INTRAVENOUS
  Filled 2017-08-13 (×3): qty 150

## 2017-08-13 MED ORDER — SODIUM CHLORIDE 0.9 % IV SOLN
2.0000 g | Freq: Once | INTRAVENOUS | Status: AC
  Administered 2017-08-13: 2 g via INTRAVENOUS
  Filled 2017-08-13: qty 2

## 2017-08-13 MED ORDER — NOREPINEPHRINE BITARTRATE 1 MG/ML IV SOLN
0.0000 ug/min | INTRAVENOUS | Status: DC
  Administered 2017-08-13: 19 ug/min via INTRAVENOUS
  Administered 2017-08-14: 20 ug/min via INTRAVENOUS
  Administered 2017-08-17: 8 ug/min via INTRAVENOUS
  Filled 2017-08-13 (×6): qty 16

## 2017-08-13 MED ORDER — FENTANYL BOLUS VIA INFUSION
50.0000 ug | INTRAVENOUS | Status: DC | PRN
  Administered 2017-08-16 (×4): 50 ug via INTRAVENOUS
  Filled 2017-08-13: qty 50

## 2017-08-13 MED ORDER — ARTIFICIAL TEARS OPHTHALMIC OINT
1.0000 "application " | TOPICAL_OINTMENT | Freq: Three times a day (TID) | OPHTHALMIC | Status: DC
  Administered 2017-08-13 – 2017-08-17 (×14): 1 via OPHTHALMIC
  Filled 2017-08-13: qty 3.5

## 2017-08-13 MED ORDER — POTASSIUM CHLORIDE 20 MEQ/15ML (10%) PO SOLN
40.0000 meq | Freq: Once | ORAL | Status: AC
  Administered 2017-08-13: 40 meq via ORAL
  Filled 2017-08-13: qty 30

## 2017-08-13 MED ORDER — MIDAZOLAM BOLUS VIA INFUSION
2.0000 mg | INTRAVENOUS | Status: DC | PRN
  Filled 2017-08-13: qty 2

## 2017-08-13 MED ORDER — CISATRACURIUM BOLUS VIA INFUSION
0.1000 mg/kg | Freq: Once | INTRAVENOUS | Status: AC
  Administered 2017-08-13: 14.9 mg via INTRAVENOUS
  Filled 2017-08-13: qty 15

## 2017-08-13 MED ORDER — PHENYLEPHRINE HCL-NACL 10-0.9 MG/250ML-% IV SOLN
0.0000 ug/min | INTRAVENOUS | Status: DC
  Administered 2017-08-13: 20 ug/min via INTRAVENOUS
  Filled 2017-08-13 (×2): qty 250

## 2017-08-13 MED ORDER — PROPOFOL 1000 MG/100ML IV EMUL
25.0000 ug/kg/min | INTRAVENOUS | Status: DC
  Administered 2017-08-13 (×3): 50 ug/kg/min via INTRAVENOUS
  Filled 2017-08-13 (×3): qty 100

## 2017-08-13 MED ORDER — FAMOTIDINE IN NACL 20-0.9 MG/50ML-% IV SOLN
20.0000 mg | INTRAVENOUS | Status: DC
Start: 2017-08-14 — End: 2017-08-16
  Administered 2017-08-14 – 2017-08-16 (×3): 20 mg via INTRAVENOUS
  Filled 2017-08-13 (×3): qty 50

## 2017-08-13 MED ORDER — DEXMEDETOMIDINE HCL IN NACL 400 MCG/100ML IV SOLN
0.4000 ug/kg/h | INTRAVENOUS | Status: DC
  Administered 2017-08-13: 0.8 ug/kg/h via INTRAVENOUS
  Administered 2017-08-13: 1 ug/kg/h via INTRAVENOUS
  Administered 2017-08-13 (×6): 1.2 ug/kg/h via INTRAVENOUS
  Administered 2017-08-14 (×2): 0.6 ug/kg/h via INTRAVENOUS
  Filled 2017-08-13 (×11): qty 100

## 2017-08-13 NOTE — Progress Notes (Signed)
PULMONARY / CRITICAL CARE MEDICINE   Name: April [name] MRN: [id number] DOB: 1970-05-09    ADMISSION DATE:  08/06/2017 CONSULTATION DATE:  08/11/17  REFERRING MD:  Dr. Karleen Hampshire / [facility]   CHIEF COMPLAINT:  Acute Respiratory Failure   BRIEF SUMMARY: 47 y/o F, recently discharged after lower respiratory tract infection/asthma exacerbation, admitted 4/10 with concern for PNA, AF, dCHF and sub-optimal medical compliance.  PCCM consulted on admit for tachypnea (but pt would get up and go to the bathroom unassisted).  Refused bipap. The patient developed increased respiratory distress and was treated with bipap.  ABG on BiPAP 7.495 / 39 / 57 / 88.  She was intubated in the early am of 4/15 by anesthesia.     SUBJECTIVE:  Tmax 99.1.  Hypotension overnight requiring 500 ml NS bolus, peripheral neo.  Aline inserted for BP monitoring.     Respiratory panel positive for Influenza A H1N1  VITAL SIGNS: BP (!) 137/57   Pulse 98   Temp 98.2 F (36.8 C) (Oral)   Resp (!) 35   Ht 5\' 6"  (1.676 m)   Wt (!) 328 lb 14.8 oz (149.2 kg)   LMP  (LMP Unknown)   SpO2 96%   BMI 53.09 kg/m   HEMODYNAMICS:    Initially on increasing phenylephrine infusion. Now hypertensive since paralyzed and compliant with ventilation.  VENTILATOR SETTINGS: Vent Mode: PRVC FiO2 (%):  [60 %-80 %] 70 % Set Rate:  [18 bmp] 18 bmp Vt Set:  [470 mL] 470 mL PEEP:  [8 cmH20-10 cmH20] 10 cmH20 Plateau Pressure:  [19 cmH20-23 cmH20] 23 cmH20  INTAKE / OUTPUT: I/O last 3 completed shifts: In: 2591.6 [I.V.:2387.6; NG/GT:131; IV Piggyback:73] Out: 1890 [OVFIE:3329]  PHYSICAL EXAMINATION: General:  Morbidly obese.  + air hunger prior to paralytic HEENT: MM pink/moist Neuro:  Sedated to RASS -5.  BIS applied for sedation monitoring during NMB CV: s1s2 rrr, no m/r/g. Extremities warm and well perfused. No edema. PULM: ++ inspiratory effort in sync with ventilator. Post paralysis - no respiratory effort. P/T curve suggests ongoing  airway obstruction, no auto PEEP. PEEP optimal by stress strain. Vesicular breathing throughout with crackles posteriorly only and no wheezing. JJ:OACZ, non-tender, bsx4 active . Tolerating tube feeds Extremities: warm/dry, no edema  Skin: no rashes or lesions. Line sites intact.  LABS:  BMET Recent Labs  Lab 08/11/17 0702 08/12/17 0338 08/13/17 0300  NA 136 137 139  K 3.4* 3.3* 3.1*  CL 96* 97* 104  CO2 26 23 21*  BUN 31* 67* 90*  CREATININE 1.28* 3.34* 3.51*  GLUCOSE 255* 96 172*    Electrolytes Recent Labs  Lab 08/11/17 0702 08/12/17 0338 08/13/17 0300  CALCIUM 7.9* 8.0* 7.4*  MG  --  2.7* 2.2  PHOS  --  7.2* 6.8*    CBC Recent Labs  Lab 08/11/17 0702 08/12/17 0338 08/13/17 0300  WBC 19.9* 17.9* 18.4*  HGB 11.9* 11.5* 10.4*  HCT 37.7 36.6 33.2*  PLT 193 196 149*    Coag's No results for input(s): APTT, INR in the last 168 hours.  Sepsis Markers Recent Labs  Lab 08/06/17 2346 08/08/17 1849  08/10/17 0319 08/11/17 0255 08/11/17 0702  LATICACIDVEN 3.38* 1.9  --   --   --  1.2  PROCALCITON  --   --    < > <0.10 <0.10 0.15   < > = values in this interval not displayed.    ABG Recent Labs  Lab 08/09/17 0855 08/11/17 0117 08/11/17 6606  PHART 7.453* 7.495* 7.347*  PCO2ART 38.5 39.8 54.4*  PO2ART 57.7* 57.5* 106    Liver Enzymes Recent Labs  Lab 08/06/17 2118  AST 25  ALT 28  ALKPHOS 89  BILITOT 0.6  ALBUMIN 3.2*    Cardiac Enzymes Recent Labs  Lab 08/11/17 0702  TROPONINI <0.03    Glucose Recent Labs  Lab 08/12/17 1114 08/12/17 1522 08/12/17 1920 08/12/17 2329 08/13/17 0341 08/13/17 0721  GLUCAP 116* 165* 107* 116* 170* 187*    Imaging Dg Chest Port 1 View  Result Date: 08/13/2017 CLINICAL DATA:  Respiratory failure EXAM: PORTABLE CHEST 1 VIEW COMPARISON:  08/12/2017 FINDINGS: Cardiac shadow is stable. Endotracheal tube and nasogastric catheter are again seen and stable. Persistent consolidation in the medial aspect  of the right upper lobe is noted. Bilateral airspace opacities are again seen and stable. No sizable effusion is noted. No bony abnormality is seen. IMPRESSION: Stable appearance when compared with the prior exam. Electronically Signed   By: Inez Catalina M.D.   On: 08/13/2017 07:21    STUDIES:  ECHO 4/8 >> LVEF estimated at 60-65%, normal wall motion, LA mildly dilated   CULTURES: BCx2 4/10 >> negative RVP 4/10 >> Flu A positive  Legionella 4/15 >> negative Strep Antigen >> negative  ANTIBIOTICS: Azithromycin 4/14 >> Tamiflu 4/16 >>   SIGNIFICANT EVENTS: 4/10  Admit  4/13  PCCM consulted  4/15  Pt intubated for increased work of breathing 4/16  Hypotension, aline placed, peripheral neo  LINES/TUBES: ETT 4/15 >>  ALine 4/17 >>  DISCUSSION: 47 y/o F admitted with concern for AECOPD and possible LLL PNA.  Has had significant work of breathing despite BiPAP, hypoxia on ABG.  BNP negative and ECHO as above, doubt CHF.  Had significant work of breathing early 4/15 am and intubated by anesthesia.  Diuresed. Developed AKI.  RVP positive for influenza A.    ASSESSMENT / PLAN:  PULMONARY A: Acute Hypoxic Respiratory Failure - no PH noted on ECHO 08/04/17 Influenza A Positive  Diffuse Bilateral Opacities - concern for influenza pneumonitis,  Acute Exacerbation of COPD  Possible CAP  Tobacco Abuse  COPD OSA - mild, as of 2008 sleep study  P:   NMB PRVC6 cc/kg  Continue PEEP 10 / FiO2 for sats 88-95% Follow intermittent CXR  Continue brovana + pulmicort  Discontinue steroids,Continue singulair    CARDIOVASCULAR A:  Hypotension - overnight 4/16, 4/17 required vasopressors, likely due to very negative intrathoracic pressures.  Severe HTN - ? Related to hyperaldosteronism Diastolic CHF - grade 1 by most recent ECHO Atrial Fibrillation  P:   Hold losartan Reduce dose of lopressor to 12.5 mg BID  Received NS bolus overnight  Continue Eliquis for now, if renal function worsens  consider lovenox ICU monitoring  Hold spironolactone Continue cardizem 60 mg PT Q6   RENAL A:   AKI  Hypokalemia  P:    Hold diuresis 4/16  TFI 150 mL/h. Trend BMP / urinary output, follow CVP Replace electrolytes as indicated, hold replacement 4/16 with AKI Avoid nephrotoxic agents, ensure adequate renal perfusion  GASTROINTESTINAL A:   GERD Morbid Obesity  P:    Pepcid for SUP  TF per Nutrition > propofol providing most of caloric need   HEMATOLOGIC A:   Influenza A Positive  Leukocytosis - ? Related to steroids, essentially negative PCT  P:   Trend CBC  Continue Eliquis for DVT prophylaxis  Begin Tamiflu D1/5  INFECTIOUS A:   Possible CAP - ? LLL  infiltrate, PCT negative, leukocytosis ? related to steroids  P:    Monitor fever curve / WBC trend  Azithromycin, D3/x Follow cultures   ENDOCRINE A:   DM with Steroid Induced Hyperglycemia    P:    DM Coordinator following  Continue Q4 SSI, resistant scale  Reduce Lantus to 10 units QD with stopping steroids "Meal" coverage 4 units Q4  NEUROLOGIC A:   ICU Associated Pain  Anxiety / Depression  P:    RASS goal: 0 to -1  Propofol for sedation  PRN versed / fentanyl  Daily SBT / WUA  PT efforts  Continue zoloft, buspar    FAMILY  - Updates:     Critically ill due respiratory failure requiring mechanical ventilation, neuromuscular blockade and titration of sedation. At risk of life-threatening decompensation. Total critical care time excluding procedures: 82min.

## 2017-08-13 NOTE — Progress Notes (Signed)
Nutrition Follow-up  DOCUMENTATION CODES:   Morbid obesity  INTERVENTION:  - Continue current TF regimen: Vital High Protein @ 5 mL/hr with 30 mL Prostat once per day + 60 mL Prostat BID (5 packets total/day). This regimen + kcal from current Propofol rate is providing 1832 kcal (124% estimated kcal need), 85 grams of protein (57% estimated protein need), 346 mg Phos, and 100 mL free water. - Will continue to monitor Propofol rate/changes.    NUTRITION DIAGNOSIS:   Inadequate oral intake related to inability to eat as evidenced by NPO status. -ongoing  GOAL:   Provide needs based on ASPEN/SCCM guidelines -met for kcal and unmet for protein d/t current Propofol rate.  MONITOR:   Vent status, TF tolerance, Weight trends, Labs  REASON FOR ASSESSMENT:   Ventilator  ASSESSMENT:   47 yr old female with AECOPD and acute hypercapnic respiratory failure failed NIPPV and diuresis. Required intubation.   Pt remains intubated, sedated, with OGT in place. No family/visitors have been present at the time of RD visits this week. Spoke with RN at bedside and confirmed to continue current TF regimen. She reports plan for this AM to chemically paralyze pt.    Patient is currently intubated on ventilator support MV: 8.4 L/min Temp (24hrs), Avg:98.5 F (36.9 C), Min:98 F (36.7 C), Max:99.1 F (37.3 C) Propofol: 45.9 ml/hr (1212 kcal) BP: 130/40 and MAP: 75  Medications reviewed; 20 mg IV Pepcid/day, sliding scale Novolog, 5 units Novolog every 4 hours, 10 units Lantus/day Labs reviewed; CBGs: 170 and 187 mg/dL this AM, K: 3.1 mmol/L, BUN: 90 mg/dL, creatinine: 3.51 mg/dL, Ca: 7.4 mg/dL, Phos: 6.8 mg/dL (down from yesterday), GFR: 17 mL/min.   IVF: LR @ 50 mL/hr.  Drips: Precedex @ 1.2 mcg/kg/hr, Fentanyl @ 150 mcg/hr, Propofol @ 50 mcg/kg/min.     Diet Order:  Diet NPO time specified  EDUCATION NEEDS:   No education needs have been identified at this time  Skin:  Skin  Assessment: Reviewed RN Assessment  Last BM:  4/17  Height:   Ht Readings from Last 1 Encounters:  08/12/17 _0  (1.676 m)    Weight:   Wt Readings from Last 1 Encounters:  08/13/17 (!) 328 lb 14.8 oz (149.2 kg)    Ideal Body Weight:  59.09 kg  BMI:  Body mass index is 53.09 kg/m.  Estimated Nutritional Needs:   Kcal:  5686-1683 (22-25 kcal/kg IBW)  Protein:  >/= 148 grams (2.5 grams/kg IBW)  Fluid:  >/= 1.6 L/day      Jarome Matin, MS, RD, LDN, [facility] Inpatient Clinical Dietitian Pager # [phone] After hours/weekend pager # [phone]

## 2017-08-13 NOTE — Progress Notes (Addendum)
Pt BP low after bolus. Propofol titrated down to see if BP would improve. Propofol now off and precedex started for sedation with no improvement of BP. Critical care called and updated about low BP and low UOP. April [name] told to place and A-line. Awaiting order for placement to confirm if BP is correctly low.

## 2017-08-13 NOTE — Procedures (Signed)
Central Venous Catheter Insertion Procedure Note April [name] [id number] 12-Nov-1970  Procedure: Insertion of Central Venous Catheter Indications: Assessment of intravascular volume and Drug and/or fluid administration  Procedure Details Consent: Unable to obtain consent because of emergent medical necessity. Time Out: Verified patient identification, verified procedure, site/side was marked, verified correct patient position, special equipment/implants available, medications/allergies/relevent history reviewed, required imaging and test results available.  Performed  Maximum sterile technique was used including antiseptics, cap, gloves, gown, hand hygiene, mask and sheet. Skin prep: Chlorhexidine; local anesthetic administered A antimicrobial bonded/coated triple lumen catheter was placed in the left internal jugular vein using the Seldinger technique.  Evaluation Blood flow good Complications: No apparent complications Patient did tolerate procedure well. Chest X-ray ordered to verify placement.  CXR: normal.  April [name] 08/13/2017, 10:52 AM

## 2017-08-13 NOTE — Progress Notes (Signed)
Base line TOF was 4/4 twitches on 40amps. One attempted at 1300 4/4 twitches on 80amps. MD made aware.

## 2017-08-13 NOTE — Procedures (Signed)
Arterial Catheter Insertion Procedure Note April [name] [id number] 12/12/1970  Procedure: Insertion of Arterial Catheter  Indications: Blood pressure monitoring  Procedure Details Consent: Risks of procedure as well as the alternatives and risks of each were explained to the (patient/caregiver).  Consent for procedure obtained. and Unable to obtain consent because of altered level of consciousness. Time Out: Verified patient identification, verified procedure, site/side was marked, verified correct patient position, special equipment/implants available, medications/allergies/relevent history reviewed, required imaging and test results available.  Performed  Maximum sterile technique was used including antiseptics, cap, gloves, gown, hand hygiene, mask and sheet. Skin prep: Chlorhexidine; local anesthetic administered 22 gauge catheter was inserted into right radial artery using the Seldinger technique. ULTRASOUND GUIDANCE USED: YES Evaluation Blood flow good; BP tracing good. Complications: No apparent complications.   Winferd Humphrey 08/13/2017

## 2017-08-13 NOTE — Progress Notes (Signed)
eLink Physician-Brief Progress Note Patient Name: April [name] DOB: 01/05/1971 MRN: [id number]   Date of Service  08/13/2017  HPI/Events of Note  BP continues to be low, confirmed on a line  eICU Interventions  Continue to wean sedation as tolerated Additional 500 cc NS bolus Start peripheral neo     Intervention Category Major Interventions: Hypotension - evaluation and management  Audray Rumore 08/13/2017, 1:46 AM

## 2017-08-13 NOTE — Progress Notes (Addendum)
Pharmacy Antibiotic Note  April [name] is a 47 y.o. female admitted on 08/06/2017 with acute respiratory failure d/t influenza. Patient was initially started on Vanc, Cefepime, Azithromycin, and Tamiflu. Vanc and cefepime were stopped 4/14 but now resuming with clinical deterioration.   Plan:  Vancomycin 2000 mg IV now, then 1250 mg IV q48 hr (est AUC 497 based on SCr 3.51)  Measure vancomycin AUC at steady state as indicated  Cefepime 1 g IV q12 hr (borderline CrCl; using higher dose for obesity)  CrCl q48 hr while on Vanc + Cefepime  Continue Azithromycin and Tamiflu per MD, dosing appropriate   Height: 5\' 6"  (167.6 cm) Weight: (!) 328 lb 14.8 oz (149.2 kg) IBW/kg (Calculated) : 59.3  Temp (24hrs), Avg:98.7 F (37.1 C), Min:97.5 F (36.4 C), Max:99.7 F (37.6 C)  Recent Labs  Lab 08/06/17 2128 08/06/17 2346  08/08/17 1849 08/09/17 1014  08/10/17 0319 08/11/17 0255 08/11/17 0702 08/12/17 0338 08/13/17 0300  WBC  --   --    < >  --  24.7*  --  23.2*  --  19.9* 17.9* 18.4*  CREATININE  --   --    < >  --  0.72   < > 0.73 0.73 1.28* 3.34* 3.51*  LATICACIDVEN 3.88* 3.38*  --  1.9  --   --   --   --  1.2  --   --    < > = values in this interval not displayed.    Estimated Creatinine Clearance: 30.8 mL/min (A) (by C-G formula based on SCr of 3.51 mg/dL (H)).    Allergies  Allergen Reactions  . Contrast Media [Iodinated Diagnostic Agents] Itching    Ct contrast   Antimicrobials this admission:  PTA Nystatin oral >> 4/10 Vancomycin  >> 4/14, resume 4/17 4/10 Cefepime  >> 4/14, resume 4/17 4/14 Zithromax >> 4/16 Tamiflu >> 5 days  Microbiology results 4/10 BCx2: ngtd 4/11 MRSA PCR: positive 4/15 Strep pneumo: neg 4/15 Resp panel: pos Infl A   Thank you for allowing pharmacy to be a part of this patient's care.  Reuel Boom, PharmD, BCPS [phone] 08/13/2017, 5:46 PM

## 2017-08-14 ENCOUNTER — Inpatient Hospital Stay (HOSPITAL_COMMUNITY): Payer: Medicaid Other

## 2017-08-14 LAB — BASIC METABOLIC PANEL
ANION GAP: 12 (ref 5–15)
BUN: 85 mg/dL — AB (ref 6–20)
CO2: 21 mmol/L — ABNORMAL LOW (ref 22–32)
Calcium: 7.1 mg/dL — ABNORMAL LOW (ref 8.9–10.3)
Chloride: 107 mmol/L (ref 101–111)
Creatinine, Ser: 2.75 mg/dL — ABNORMAL HIGH (ref 0.44–1.00)
GFR calc non Af Amer: 20 mL/min — ABNORMAL LOW (ref >60)
GFR, EST AFRICAN AMERICAN: 23 mL/min — AB (ref >60)
Glucose, Bld: 159 mg/dL — ABNORMAL HIGH (ref 65–99)
POTASSIUM: 3.7 mmol/L (ref 3.5–5.1)
SODIUM: 140 mmol/L (ref 135–145)

## 2017-08-14 LAB — TRIGLYCERIDES
TRIGLYCERIDES: 411 mg/dL — AB (ref <150)
Triglycerides: 391 mg/dL — ABNORMAL HIGH

## 2017-08-14 LAB — CREATININE, SERUM
Creatinine, Ser: 2.71 mg/dL — ABNORMAL HIGH (ref 0.44–1.00)
GFR calc Af Amer: 23 mL/min — ABNORMAL LOW
GFR calc non Af Amer: 20 mL/min — ABNORMAL LOW

## 2017-08-14 LAB — BLOOD GAS, ARTERIAL
ACID-BASE DEFICIT: 3.6 mmol/L — AB (ref 0.0–2.0)
Bicarbonate: 22.9 mmol/L (ref 20.0–28.0)
DRAWN BY: 441261
FIO2: 80
MECHVT: 480 mL
O2 Saturation: 93.4 %
PEEP/CPAP: 10 cmH2O
Patient temperature: 99.1
RATE: 28 resp/min
pCO2 arterial: 51.8 mmHg — ABNORMAL HIGH (ref 32.0–48.0)
pH, Arterial: 7.269 — ABNORMAL LOW (ref 7.350–7.450)
pO2, Arterial: 71.9 mmHg — ABNORMAL LOW (ref 83.0–108.0)

## 2017-08-14 LAB — GLUCOSE, CAPILLARY
GLUCOSE-CAPILLARY: 187 mg/dL — AB (ref 65–99)
GLUCOSE-CAPILLARY: 140 mg/dL — AB (ref 65–99)
GLUCOSE-CAPILLARY: 153 mg/dL — AB (ref 65–99)
GLUCOSE-CAPILLARY: 186 mg/dL — AB (ref 65–99)
GLUCOSE-CAPILLARY: 222 mg/dL — AB (ref 65–99)
Glucose-Capillary: 262 mg/dL — ABNORMAL HIGH (ref 65–99)

## 2017-08-14 LAB — MAGNESIUM: Magnesium: 2.2 mg/dL (ref 1.7–2.4)

## 2017-08-14 LAB — PHOSPHORUS: Phosphorus: 5.6 mg/dL — ABNORMAL HIGH (ref 2.5–4.6)

## 2017-08-14 LAB — BRAIN NATRIURETIC PEPTIDE: B Natriuretic Peptide: 17.2 pg/mL (ref 0.0–100.0)

## 2017-08-14 MED ORDER — VITAL HIGH PROTEIN PO LIQD
1000.0000 mL | ORAL | Status: DC
  Administered 2017-08-14 – 2017-08-16 (×3): 1000 mL
  Filled 2017-08-14 (×5): qty 1000

## 2017-08-14 MED ORDER — INSULIN GLARGINE 100 UNIT/ML ~~LOC~~ SOLN
20.0000 [IU] | Freq: Every day | SUBCUTANEOUS | Status: DC
  Administered 2017-08-14 – 2017-08-16 (×3): 20 [IU] via SUBCUTANEOUS
  Filled 2017-08-14 (×3): qty 0.2

## 2017-08-14 MED ORDER — ALBUMIN HUMAN 5 % IV SOLN
12.5000 g | Freq: Once | INTRAVENOUS | Status: AC
  Administered 2017-08-14: 12.5 g via INTRAVENOUS
  Filled 2017-08-14: qty 250

## 2017-08-14 MED ORDER — SODIUM CHLORIDE 0.9 % IV SOLN: Freq: Once | INTRAVENOUS | Status: DC

## 2017-08-14 MED ORDER — PROPOFOL 1000 MG/100ML IV EMUL: 5.0000 ug/kg/min | INTRAVENOUS | Status: DC

## 2017-08-14 MED ORDER — VITAL HIGH PROTEIN PO LIQD
1000.0000 mL | ORAL | Status: DC
  Administered 2017-08-14: 1000 mL
  Filled 2017-08-14: qty 1000

## 2017-08-14 MED ORDER — SODIUM CHLORIDE 0.9 % IV SOLN
Freq: Once | INTRAVENOUS | Status: AC
  Administered 2017-08-14: 21:00:00 via INTRAVENOUS

## 2017-08-14 NOTE — Progress Notes (Signed)
eLink Physician-Brief Progress Note Patient Name: April [name] DOB: 1970-12-01 MRN: [id number]   Date of Service  08/14/2017  HPI/Events of Note  TOF zero twitches on 3 mcg of Nimbex. Order is for 3-10 mcg.  eICU Interventions  Change order to 0.5-10 mcg and titrate to twitches per order set.     Intervention Category Intermediate Interventions: Medication change / dose adjustment  Shadrick Senne U Bobie Kistler 08/14/2017, 1:06 AM

## 2017-08-14 NOTE — Progress Notes (Addendum)
PULMONARY / CRITICAL CARE MEDICINE   Name: April [name] MRN: [id number] DOB: Nov 12, 1970    ADMISSION DATE:  08/06/2017 CONSULTATION DATE:  08/11/17  REFERRING MD:  Dr. Karleen Hampshire / [facility]   CHIEF COMPLAINT:  Acute Respiratory Failure   BRIEF SUMMARY: 47 y/o F, recently discharged after lower respiratory tract infection/asthma exacerbation, admitted 4/10 with concern for PNA, AF, dCHF and sub-optimal medical compliance.  PCCM consulted on admit for tachypnea (but pt would get up and go to the bathroom unassisted).  Refused bipap. The patient developed increased respiratory distress and was treated with bipap.  ABG on BiPAP 7.495 / 39 / 57 / 88.  She was intubated in the early am of 4/15 by anesthesia.  Flu A positive.  Progressive infiltrates / ARDS.  Paralytics initiated 4/17.     SUBJECTIVE:   RN reports paralytics initiated yesterday, central line placed.  Did not tolerated 6cc/kg.    VITAL SIGNS: BP (!) 108/37   Pulse 99   Temp 97.8 F (36.6 C) (Axillary)   Resp (!) 28   Ht 5\' 6"  (1.676 m)   Wt (!) 328 lb 14.8 oz (149.2 kg)   LMP  (LMP Unknown)   SpO2 100%   BMI 53.09 kg/m   HEMODYNAMICS:  CVP:  [5 mmHg-28 mmHg] 12 mmHg Initially on increasing phenylephrine infusion. Now hypertensive since paralyzed and compliant with ventilation.  VENTILATOR SETTINGS: Vent Mode: PRVC FiO2 (%):  [80 %-100 %] 90 % Set Rate:  [24 bmp-28 bmp] 28 bmp Vt Set:  [360 mL-480 mL] 480 mL PEEP:  [10 cmH20] 10 cmH20 Plateau Pressure:  [20 cmH20-31 cmH20] 29 cmH20  INTAKE / OUTPUT: I/O last 3 completed shifts: In: 5814.7 [I.V.:4934.7; Other:30; NG/GT:250; IV Piggyback:600] Out: 2619 [Urine:2619]  PHYSICAL EXAMINATION: General: critically ill appearing adult female in NAD on vent  HEENT: MM pink/moist, ETT Neuro: sedate / paralyzed  CV: s1s2 rrr, no m/r/g PULM: even/non-labored, lungs bilaterally coarse  DX:AJOI, non-tender, bsx4 active  Extremities: warm/dry, no overt edema  Skin: no rashes or  lesions, L [place] TLC  LABS:  BMET Recent Labs  Lab 08/11/17 0702 08/12/17 0338 08/13/17 0300 08/14/17 0630  NA 136 137 139  --   K 3.4* 3.3* 3.1*  --   CL 96* 97* 104  --   CO2 26 23 21*  --   BUN 31* 67* 90*  --   CREATININE 1.28* 3.34* 3.51* 2.71*  GLUCOSE 255* 96 172*  --     Electrolytes Recent Labs  Lab 08/11/17 0702 08/12/17 0338 08/13/17 0300 08/14/17 0630  CALCIUM 7.9* 8.0* 7.4*  --   MG  --  2.7* 2.2 2.2  PHOS  --  7.2* 6.8* 5.6*    CBC Recent Labs  Lab 08/11/17 0702 08/12/17 0338 08/13/17 0300  WBC 19.9* 17.9* 18.4*  HGB 11.9* 11.5* 10.4*  HCT 37.7 36.6 33.2*  PLT 193 196 149*    Coag's No results for input(s): APTT, INR in the last 168 hours.  Sepsis Markers Recent Labs  Lab 08/08/17 1849  08/10/17 0319 08/11/17 0255 08/11/17 0702  LATICACIDVEN 1.9  --   --   --  1.2  PROCALCITON  --    < > <0.10 <0.10 0.15   < > = values in this interval not displayed.    ABG Recent Labs  Lab 08/13/17 1155 08/13/17 1428 08/13/17 1647  PHART 7.198* 7.049* 7.128*  PCO2ART 56.8* 89.0* 67.0*  PO2ART 65.2* 75.0* 82.6*    Liver Enzymes  No results for input(s): AST, ALT, ALKPHOS, BILITOT, ALBUMIN in the last 168 hours.  Cardiac Enzymes Recent Labs  Lab 08/11/17 0702  TROPONINI <0.03    Glucose Recent Labs  Lab 08/13/17 0721 08/13/17 1104 08/13/17 1519 08/13/17 1940 08/13/17 2311 08/14/17 0339  GLUCAP 187* 174* 248* 235* 245* 187*    Imaging Dg Chest Port 1 View  Result Date: 08/13/2017 CLINICAL DATA:  47 year old female central line placement.  ARDS. EXAM: PORTABLE CHEST 1 VIEW COMPARISON:  0432 hours today and earlier. FINDINGS: Portable AP semi upright view at 1023 hours. Left subclavian approach central line placed with tip just below the level of the carina at the SVC level. No pneumothorax. Endotracheal tube tip is in good position between the clavicles and carina. Enteric tube courses to the abdomen and side hole is at the level of  the proximal gastric body. Stable cardiac size and mediastinal contours. Continued diffuse bilateral pulmonary opacity. Stable lung volumes. IMPRESSION: 1. Left subclavian central line placed with tip at the SVC level. No adverse features. 2. ET tube and NG tube in good position. 3. Stable ventilation with continued diffuse bilateral pulmonary opacity. This is compatible with ARDS. Electronically Signed   By: Genevie Ann M.D.   On: 08/13/2017 10:46    STUDIES:  ECHO 4/8 >> LVEF estimated at 60-65%, normal wall motion, LA mildly dilated   CULTURES: BCx2 4/10 >> negative RVP 4/10 >> Flu A positive  Legionella 4/15 >> negative Strep Antigen 4/15 >> negative BCx2 4/17 >>    ANTIBIOTICS: Azithromycin 4/14 >> Tamiflu 4/16 >>  Cefepime 4/17 >>  Vanco 4/17 >>   SIGNIFICANT EVENTS: 4/10  Admit  4/13  PCCM consulted  4/15  Pt intubated for increased work of breathing 4/16  Hypotension, aline placed, peripheral neo 4/17  ARDS protocol, paralytics, TLC / Aline   LINES/TUBES: ETT 4/15 >>  ALine 4/17 >> L [place] TLC 4/17 >>  DISCUSSION: 47 y/o F admitted with concern for AECOPD and possible LLL PNA.  Has had significant work of breathing despite BiPAP, hypoxia on ABG.  BNP negative and ECHO as above, doubt CHF.  Had significant work of breathing early 4/15 am and intubated by anesthesia.  Diuresed. Developed AKI.  RVP positive for influenza A.    ASSESSMENT / PLAN:  PULMONARY A: Acute Hypoxic Respiratory Failure - no PH noted on ECHO 08/04/17 Influenza A Positive  Diffuse Bilateral Opacities / ARDS - concern for influenza pneumonitis, ARDS Acute Exacerbation of COPD  Possible CAP  Tobacco Abuse  COPD OSA - mild, as of 2008 sleep study  P:   PRVC 8 cc/kg > did not tolerate 6cc Increase rate to 30  Wean PEEP / FiO2 for sats 88-95% NMB / sedation  Continue Duoneb + PRN albuterol Continue singulair Follow intermittent CXR   Droplet precautions   CARDIOVASCULAR A:  Hypotension -  overnight 4/16, 4/17 required vasopressors, likely due to very negative intrathoracic pressures.  Severe HTN - ? Related to hyperaldosteronism Diastolic CHF - grade 1 by most recent ECHO Atrial Fibrillation  P:  ICU monitoring / ALine  Continue apixaban  Lopressor (reduced dose) 12.5 mg PT BID for AF Levophed as needed for MAP >65 due to sedation Hold losartan, spironolactone, diltiazem   RENAL A:   AKI  Hypokalemia  P:   Trend BMP / urinary output Replace electrolytes as indicated Avoid nephrotoxic agents, ensure adequate renal perfusion  GASTROINTESTINAL A:   GERD Morbid Obesity  P:   NPO /  OGT Pepcid for SUP  TF per Nutriton > increase rate to 40 ml/hr now off propofol & nutrition to titrate as appropriate  HEMATOLOGIC A:   Influenza A Positive  Leukocytosis - ? Related to steroids, essentially negative PCT  P:  Trend CBC  Continue Eliquis for DVT prophylaxis   INFECTIOUS A:   Possible CAP - ? LLL infiltrate, PCT negative, leukocytosis ? related to steroids  Influenza A  P:   Tamiflu  Continue abx as above, D5/x Monitor fever curve / WBC trend  Follow cultures   ENDOCRINE A:   DM with Steroid Induced Hyperglycemia    P:   SSI, resistant scale  Increase back to Lantus 20 units QD   TF coverage 5 units Q4   NEUROLOGIC A:   ICU Associated Pain  Anxiety / Depression  P:   RASS goal: -5 with paralytics  Consider wean of paralytics in am 4/19 Fentanyl + Versed for sedation  Discontinue precedex NMB  Daily SBT / WUA  PT efforts  Continue zoloft, buspar    FAMILY  - Updates:  No family available am 4/18.  Called daughter Bonne Dolores) and updated via phone.     CC Time:  18 minutes   [name], NP-C [facility] Pgr: [phone] or if no answer [phone] 08/14/2017, 8:11 AM

## 2017-08-14 NOTE — Progress Notes (Signed)
Nutrition Follow-up  DOCUMENTATION CODES:   Morbid obesity  INTERVENTION:  - Will adjust TF regimen: Vital High Protein @ 45 mL/hr with 60 mL Prostat BID. This regimen will provide 1480 kcal, 154 grams of protein, 161 grams of carb, 1064 mg Phos, and 903 mL free water.  - Will continue to monitor for need to further adjust dependent on medical course and labs.   NUTRITION DIAGNOSIS:   Inadequate oral intake related to inability to eat as evidenced by NPO status. -ongoing  GOAL:   Provide needs based on ASPEN/SCCM guidelines -unmet with current TF regimen  MONITOR:   Vent status, TF tolerance, Weight trends, Labs  ASSESSMENT:   47 yr old female with AECOPD and acute hypercapnic respiratory failure failed NIPPV and diuresis. Required intubation.   Weight trending down from 4/11-4/17; -16 lbs/7 kg during that time. Estimated needs based on IBW so needs remain appropriate. Pt remains intubated, chemically paralyzed, and with OGT in place. Propofol now turned off so will adjust TF regimen as outlined above. Noted need for several insulin modalities per day.   Per April [name]'s note this AM: pt now noted to be influenza A positive and placed on droplet precautions, required pressors on 4/17 d/t hypotension overnight 4/16, Levo for MAP >65, AKI, consideration for weaning from paralytics on 4/19 AM.  Patient is currently intubated on ventilator support MV: 14.2 L/min Temp (24hrs), Avg:99.1 F (37.3 C), Min:97.8 F (36.6 C), Max:100.7 F (38.2 C) Propofol: none BP: 112/46 and MAP: 67  Medications reviewed; 20 mg IV Pepcid/day, sliding scale Novolog, 5 units Novolog every 4 hours, 20 units Lantus/day, 5 mL Mycostatin QID, 40 mEq oral KCl per OGT x1 yesterday Labs reviewed; CBGs: 187 mg/dL this AM, BUN: 85 mg/dL, creatinine: 2.75 mg/dL, Ca: 7.1 mg/dL, Phos: 5.6 mg/dL (continues to trend down), GFR: 23 mL/min.   IVF: D5-150 mEq sodium bicarb @ 50 mL/hr (204 kcal).  Drips: Levo @ 20 mcg/min,  Fentanyl @ 100 mcg/hr, Versed @ 4 mg/hr, Nimbex @ 2 mcg/kg/min.     Diet Order:  Diet NPO time specified  EDUCATION NEEDS:   No education needs have been identified at this time  Skin:  Skin Assessment: Reviewed RN Assessment  Last BM:  4/17  Height:   Ht Readings from Last 1 Encounters:  08/12/17 5\' 6"  (1.676 m)    Weight:   Wt Readings from Last 1 Encounters:  08/13/17 (!) 328 lb 14.8 oz (149.2 kg)    Ideal Body Weight:  59.09 kg  BMI:  Body mass index is 53.09 kg/m.  Estimated Nutritional Needs:   Kcal:  6629-4765 (22-25 kcal/kg IBW)  Protein:  >/= 148 grams (2.5 grams/kg IBW)  Fluid:  >/= 1.6 L/day      April Matin, MS, RD, LDN, [facility] Inpatient Clinical Dietitian Pager # [phone] After hours/weekend pager # [phone]

## 2017-08-14 NOTE — Progress Notes (Signed)
eLink Physician-Brief Progress Note Patient Name: April [name] DOB: [birth year] MRN: [id number]   Date of Service  08/14/2017  HPI/Events of Note  Sinus tachycardia  Likely secondary to relative hypovolemia vs inadequate sedation on the ventilator. Pt with ARDS and has been diuresed aggressively to benefit his lungs.  eICU Interventions  Check N-Peptide, 250 ml iv fluid bolus x 1, increase Versed infusion rate to 5 mg/hr     Intervention Category Intermediate Interventions: Hypovolemia - evaluation and management  Frederik Pear 08/14/2017, 8:48 PM

## 2017-08-15 ENCOUNTER — Inpatient Hospital Stay (HOSPITAL_COMMUNITY): Payer: Medicaid Other

## 2017-08-15 ENCOUNTER — Other Ambulatory Visit: Payer: Self-pay

## 2017-08-15 LAB — BLOOD GAS, ARTERIAL
ACID-BASE EXCESS: 2.2 mmol/L — AB (ref 0.0–2.0)
Acid-Base Excess: 2.1 mmol/L — ABNORMAL HIGH (ref 0.0–2.0)
Bicarbonate: 28 mmol/L (ref 20.0–28.0)
Bicarbonate: 28.1 mmol/L — ABNORMAL HIGH (ref 20.0–28.0)
DRAWN BY: 441261
Drawn by: 103701
FIO2: 60
FIO2: 70
LHR: 30 {breaths}/min
MECHVT: 480 mL
O2 SAT: 79.8 %
O2 Saturation: 89.4 %
PATIENT TEMPERATURE: 98.6
PCO2 ART: 54.4 mmHg — AB (ref 32.0–48.0)
PEEP/CPAP: 10 cmH2O
PEEP: 12 cmH2O
PO2 ART: 48.4 mmHg — AB (ref 83.0–108.0)
Patient temperature: 98.6
RATE: 30 resp/min
VT: 480 mL
pCO2 arterial: 55 mmHg — ABNORMAL HIGH (ref 32.0–48.0)
pH, Arterial: 7.331 — ABNORMAL LOW (ref 7.350–7.450)
pH, Arterial: 7.329 — ABNORMAL LOW (ref 7.350–7.450)
pO2, Arterial: 62 mmHg — ABNORMAL LOW (ref 83.0–108.0)

## 2017-08-15 LAB — BASIC METABOLIC PANEL
Anion gap: 10 (ref 5–15)
BUN: 74 mg/dL — ABNORMAL HIGH (ref 6–20)
CALCIUM: 7.3 mg/dL — AB (ref 8.9–10.3)
CO2: 25 mmol/L (ref 22–32)
CREATININE: 1.73 mg/dL — AB (ref 0.44–1.00)
Chloride: 109 mmol/L (ref 101–111)
GFR, EST AFRICAN AMERICAN: 40 mL/min — AB (ref >60)
GFR, EST NON AFRICAN AMERICAN: 35 mL/min — AB (ref >60)
Glucose, Bld: 224 mg/dL — ABNORMAL HIGH (ref 65–99)
Potassium: 3.8 mmol/L (ref 3.5–5.1)
Sodium: 144 mmol/L (ref 135–145)

## 2017-08-15 LAB — CBC
HEMATOCRIT: 26.9 % — AB (ref 36.0–46.0)
Hemoglobin: 8.5 g/dL — ABNORMAL LOW (ref 12.0–15.0)
MCH: 27.9 pg (ref 26.0–34.0)
MCHC: 31.6 g/dL (ref 30.0–36.0)
MCV: 88.2 fL (ref 78.0–100.0)
PLATELETS: 126 10*3/uL — AB (ref 150–400)
RBC: 3.05 MIL/uL — ABNORMAL LOW (ref 3.87–5.11)
RDW: 18 % — AB (ref 11.5–15.5)
WBC: 18.2 10*3/uL — AB (ref 4.0–10.5)

## 2017-08-15 LAB — GLUCOSE, CAPILLARY
GLUCOSE-CAPILLARY: 201 mg/dL — AB (ref 65–99)
GLUCOSE-CAPILLARY: 195 mg/dL — AB (ref 65–99)
Glucose-Capillary: 227 mg/dL — ABNORMAL HIGH (ref 65–99)
Glucose-Capillary: 157 mg/dL — ABNORMAL HIGH (ref 65–99)
Glucose-Capillary: 189 mg/dL — ABNORMAL HIGH (ref 65–99)
Glucose-Capillary: 196 mg/dL — ABNORMAL HIGH (ref 65–99)

## 2017-08-15 LAB — PHOSPHORUS: PHOSPHORUS: 3.7 mg/dL (ref 2.5–4.6)

## 2017-08-15 LAB — MAGNESIUM: MAGNESIUM: 2.2 mg/dL (ref 1.7–2.4)

## 2017-08-15 LAB — CORTISOL: CORTISOL PLASMA: 13.1 ug/dL

## 2017-08-15 MED ORDER — OSELTAMIVIR PHOSPHATE 30 MG PO CAPS
30.0000 mg | ORAL_CAPSULE | Freq: Once | ORAL | Status: AC
  Administered 2017-08-15: 30 mg via ORAL
  Filled 2017-08-15: qty 1

## 2017-08-15 MED ORDER — CHLORHEXIDINE GLUCONATE CLOTH 2 % EX PADS
6.0000 | MEDICATED_PAD | Freq: Every day | CUTANEOUS | Status: DC
  Administered 2017-08-16 – 2017-08-17 (×2): 6 via TOPICAL

## 2017-08-15 MED ORDER — SODIUM CHLORIDE 0.9 % IV SOLN
2.0000 g | Freq: Three times a day (TID) | INTRAVENOUS | Status: DC
  Administered 2017-08-15 – 2017-08-18 (×9): 2 g via INTRAVENOUS
  Filled 2017-08-15 (×11): qty 2

## 2017-08-15 MED ORDER — VASOPRESSIN 20 UNIT/ML IV SOLN
0.0300 [IU]/min | INTRAVENOUS | Status: DC
  Administered 2017-08-15 – 2017-08-16 (×2): 0.03 [IU]/min via INTRAVENOUS
  Filled 2017-08-15 (×2): qty 2

## 2017-08-15 MED ORDER — ALBUMIN HUMAN 5 % IV SOLN
12.5000 g | Freq: Once | INTRAVENOUS | Status: AC
  Administered 2017-08-15: 12.5 g via INTRAVENOUS
  Filled 2017-08-15: qty 250

## 2017-08-15 MED ORDER — VANCOMYCIN HCL 10 G IV SOLR
1250.0000 mg | INTRAVENOUS | Status: AC
  Administered 2017-08-16 – 2017-08-17 (×2): 1250 mg via INTRAVENOUS
  Filled 2017-08-15 (×2): qty 1250

## 2017-08-15 MED ORDER — METOPROLOL TARTRATE 5 MG/5ML IV SOLN
2.5000 mg | Freq: Once | INTRAVENOUS | Status: AC
  Administered 2017-08-15: 2.5 mg via INTRAVENOUS
  Filled 2017-08-15: qty 5

## 2017-08-15 MED ORDER — SODIUM CHLORIDE 0.9 % IV SOLN
Freq: Once | INTRAVENOUS | Status: AC
Start: 2017-08-15 — End: 2017-08-15
  Administered 2017-08-15: 01:00:00 via INTRAVENOUS

## 2017-08-15 MED ORDER — OSELTAMIVIR PHOSPHATE 75 MG PO CAPS: 75.0000 mg | ORAL_CAPSULE | Freq: Two times a day (BID) | ORAL | Status: DC

## 2017-08-15 MED ORDER — OSELTAMIVIR PHOSPHATE 75 MG PO CAPS
75.0000 mg | ORAL_CAPSULE | Freq: Two times a day (BID) | ORAL | Status: DC
  Administered 2017-08-15 – 2017-08-18 (×6): 75 mg via ORAL
  Filled 2017-08-15 (×7): qty 1

## 2017-08-15 NOTE — Progress Notes (Addendum)
Pharmacy Antibiotic Note  April [name] is a 47 y.o. female admitted on 08/06/2017 with acute respiratory failure d/t influenza. Patient was initially started on Vanc, Cefepime, Azithromycin, and Tamiflu. Vanc and cefepime were stopped 4/14 but now resuming with clinical deterioration.   Today, 08/15/2017: SCr improved to 1.73, Crcl ~ 62 ml/min WBC remains elevated at 18.2 Tm 99  Plan:  Increase to Vancomycin 1250 IV q24h.  Per Dr. Elsworth Soho, complete x 5 days.  Increase to Cefepime 2g IV q8 hr  Increase to Tamiflu 75 mg BID, continue beyond 5 days per Dr. Elsworth Soho  D/C Azithromycin per Dr. Elsworth Soho   Height: 5\' 6"  (167.6 cm) Weight: (!) 328 lb 14.8 oz (149.2 kg) IBW/kg (Calculated) : 59.3  Temp (24hrs), Avg:99 F (37.2 C), Min:98.6 F (37 C), Max:99.6 F (37.6 C)  Recent Labs  Lab 08/08/17 1849  08/10/17 0319  08/11/17 0702 08/12/17 0338 08/13/17 0300 08/14/17 0630 08/14/17 0901 08/15/17 0440  WBC  --    < > 23.2*  --  19.9* 17.9* 18.4*  --   --  18.2*  CREATININE  --    < > 0.73   < > 1.28* 3.34* 3.51* 2.71* 2.75* 1.73*  LATICACIDVEN 1.9  --   --   --  1.2  --   --   --   --   --    < > = values in this interval not displayed.    Estimated Creatinine Clearance: 62.4 mL/min (A) (by C-G formula based on SCr of 1.73 mg/dL (H)).    Allergies  Allergen Reactions  . Contrast Media [Iodinated Diagnostic Agents] Itching    Ct contrast   Antimicrobials this admission:  PTA Nystatin oral >> 4/10 Vancomycin  >> 4/14, resume 4/17 >> (4/21) 4/10 Cefepime  >> 4/14, resume 4/17 >> 4/14 Zithromax >> 4/19 4/16 Tamiflu >>   Microbiology results 4/10 BCx2: NGF 4/12 MRSA PCR: positive 4/15 Strep pneumo: neg 4/15 Resp panel: + Infl A 4/17 BCx: ngtd   Thank you for allowing pharmacy to be a part of this patient's care.  Gretta Arab PharmD, BCPS Pager [phone] 08/15/2017 10:00 AM

## 2017-08-15 NOTE — Progress Notes (Addendum)
PULMONARY / CRITICAL CARE MEDICINE   Name: April [name] MRN: [id number] DOB: 1970-08-02    ADMISSION DATE:  08/06/2017 CONSULTATION DATE:  08/11/17  REFERRING MD:  Dr. Karleen Hampshire / [facility]   CHIEF COMPLAINT:  Acute Respiratory Failure   BRIEF SUMMARY: 47 y/o F, recently discharged after lower respiratory tract infection/asthma exacerbation, admitted 4/10 with concern for PNA, AF, dCHF and sub-optimal medical compliance.  She was intubated in the early am of 4/15 by anesthesia.  Flu A positive.  Progressive infiltrates / ARDS.  Paralytics initiated 4/17.     SUBJECTIVE:   Remains deeply sedated, paralysed on nimbex On levo + vaso gtt UO improving   VITAL SIGNS: BP 111/89   Pulse (!) 114   Temp 99 F (37.2 C) (Oral)   Resp (!) 30   Ht 5\' 6"  (1.676 m)   Wt (!) 328 lb 14.8 oz (149.2 kg)   LMP  (LMP Unknown)   SpO2 97%   BMI 53.09 kg/m   HEMODYNAMICS:  CVP:  [3 mmHg-20 mmHg] 13 mmHg Initially on increasing phenylephrine infusion. Now hypertensive since paralyzed and compliant with ventilation.  VENTILATOR SETTINGS: Vent Mode: PRVC FiO2 (%):  [60 %-70 %] 60 % Set Rate:  [30 bmp] 30 bmp Vt Set:  [480 mL] 480 mL PEEP:  [10 cmH20] 10 cmH20 Plateau Pressure:  [30 cmH20-34 cmH20] 33 cmH20  INTAKE / OUTPUT: I/O last 3 completed shifts: In: 4763.7 [I.V.:3873.6; NG/GT:790.2; IV Piggyback:100] Out: 8338 [Urine:4650]  PHYSICAL EXAMINATION: General: critically ill bese woman on vent  HEENT: MM pink/moist, ETT Neuro: sedated / paralyzed  CV: s1s2 rrr, no m/r/g PULM: even/non-labored, lungs bilaterally coarse  SN:KNLZ, non-tender, bsx4 active  Extremities: warm/dry, no overt edema  Skin: no rashes or lesions, L [place] TLC  LABS:  BMET Recent Labs  Lab 08/13/17 0300 08/14/17 0630 08/14/17 0901 08/15/17 0440  NA 139  --  140 144  K 3.1*  --  3.7 3.8  CL 104  --  107 109  CO2 21*  --  21* 25  BUN 90*  --  85* 74*  CREATININE 3.51* 2.71* 2.75* 1.73*  GLUCOSE 172*  --  159* 224*     Electrolytes Recent Labs  Lab 08/13/17 0300 08/14/17 0630 08/14/17 0901 08/15/17 0440  CALCIUM 7.4*  --  7.1* 7.3*  MG 2.2 2.2  --  2.2  PHOS 6.8* 5.6*  --  3.7    CBC Recent Labs  Lab 08/12/17 0338 08/13/17 0300 08/15/17 0440  WBC 17.9* 18.4* 18.2*  HGB 11.5* 10.4* 8.5*  HCT 36.6 33.2* 26.9*  PLT 196 149* 126*    Coag's No results for input(s): APTT, INR in the last 168 hours.  Sepsis Markers Recent Labs  Lab 08/08/17 1849  08/10/17 0319 08/11/17 0255 08/11/17 0702  LATICACIDVEN 1.9  --   --   --  1.2  PROCALCITON  --    < > <0.10 <0.10 0.15   < > = values in this interval not displayed.    ABG Recent Labs  Lab 08/13/17 1428 08/13/17 1647 08/14/17 0837  PHART 7.049* 7.128* 7.269*  PCO2ART 89.0* 67.0* 51.8*  PO2ART 75.0* 82.6* 71.9*    Liver Enzymes No results for input(s): AST, ALT, ALKPHOS, BILITOT, ALBUMIN in the last 168 hours.  Cardiac Enzymes Recent Labs  Lab 08/11/17 0702  TROPONINI <0.03    Glucose Recent Labs  Lab 08/14/17 1132 08/14/17 1537 08/14/17 1918 08/14/17 2325 08/15/17 0313 08/15/17 0734  GLUCAP 153* 186*  222* 262* 227* 157*    Imaging Dg Chest Port 1 View  Result Date: 08/15/2017 CLINICAL DATA:  Hypoxia EXAM: PORTABLE CHEST 1 VIEW COMPARISON:  August 14, 2017 FINDINGS: Endotracheal tube tip is 4.2 cm above the carina. Central catheter tip is in the superior vena cava. Nasogastric tube tip and side port are below the diaphragm. No pneumothorax. There is interstitial and alveolar opacity throughout the lungs bilaterally. There are small pleural effusions bilaterally. There is cardiomegaly with pulmonary venous hypertension. No adenopathy. No bone lesions. IMPRESSION: Tube and catheter positions as described without pneumothorax. Widespread interstitial volar opacity. Differential considerations include congestive heart failure, widespread pneumonia, and ARDS. More than one of these entities may exist concurrently.  Appearance similar to 1 day prior. There is underlying pulmonary vascular congestion. No adenopathy demonstrable. Electronically Signed   By: Lowella Grip III M.D.   On: 08/15/2017 07:46    STUDIES:  ECHO 4/8 >> LVEF estimated at 60-65%, normal wall motion, LA mildly dilated   CULTURES: BCx2 4/10 >> negative RVP 4/10 >> Flu A positive  Legionella 4/15 >> negative Strep Antigen 4/15 >> negative BCx2 4/17 >> ng   ANTIBIOTICS: Azithromycin 4/14 >> Tamiflu 4/16 >>  Cefepime 4/17 >>  Vanco 4/17 >>   SIGNIFICANT EVENTS: 4/10  Admit  4/13  PCCM consulted  4/15  Pt intubated for increased work of breathing 4/16  Hypotension, aline placed, peripheral neo 4/17  ARDS protocol, paralytics, TLC / Aline   LINES/TUBES: ETT 4/15 >>  ALine 4/17 >> L [place] TLC 4/17 >>  DISCUSSION: 38 y/o F admitted with concern for AECOPD and possible LLL PNA. RVP positive for influenza A. Intubated for ARDS. AKI - improving  BNP negative and ECHO as above, doubt CHF.   ASSESSMENT / PLAN:  PULMONARY A: ARDS Influenza A Pneumonitis Acute Exacerbation of COPD  Possible CAP  Tobacco Abuse  COPD-FEV1 57% 2018 OSA - mild, as of 2008 sleep study  P:   Continue lung protective ventilation 8cc/kg RR @ 30 Wean PEEP / FiO2 for sats 88-95% Ct Nimbex x 24 more hrs (total 72h)  Continue Duoneb + PRN albuterol Continue singulair Follow intermittent CXR   Droplet precautions   CARDIOVASCULAR A:  Hypotension - likely due to very negative intrathoracic pressures.  H/o Severe HTN - ? Related to hyperaldosteronism Diastolic CHF - grade 1 by most recent ECHO, but low BNP Atrial Fibrillation  P:  Continue apixaban  Lopressor (reduced dose) 12.5 mg PT BID for AF Levophed as needed for MAP >65 due to sedation Hold losartan, spironolactone, diltiazem   RENAL A:   AKI - improving, non oliguric Metab acidosis  -resolved P:   Dc bicarb gtt Trend BMP / urinary output Replace electrolytes as  indicated Avoid nephrotoxic agents, ensure adequate renal perfusion  GASTROINTESTINAL A:   GERD Morbid Obesity  P:   NPO / OGT Pepcid for SUP  Ct TF per Nutriton > increase rate to 40 ml/hr  HEMATOLOGIC A:   Influenza A Positive  Leukocytosis - ? Related to steroids, essentially negative PCT  P:  Trend CBC  Continue Eliquis for DVT prophylaxis   INFECTIOUS A:   Possible CAP - ? LLL infiltrate, PCT negative, leukocytosis ? related to steroids  Influenza A  P:   Tamiflu  Continue abx as above, D5/x Monitor fever curve / WBC trend    ENDOCRINE A:   DM with Steroid Induced Hyperglycemia    P:   SSI, resistant scale  Increase  Lantus 25 units QD   TF coverage 5 units Q4   NEUROLOGIC A:   ICU Associated Pain  Anxiety / Depression  P:   RASS goal: -5 with paralytics  Fentanyl + Versed for sedation  Continue seroquel  Zoloft/ buspar on hold   FAMILY  - Updates:  No family available am 4/19. daughter Bonne Dolores)  updated 4/18  The patient is critically ill with multiple organ systems failure and requires high complexity decision making for assessment and support, frequent evaluation and titration of therapies, application of advanced monitoring technologies and extensive interpretation of multiple databases. Critical Care Time devoted to patient care services described in this note independent of APP/resident  time is 35 minutes.   Kara Mead MD. [place] Flood. [facility] Pager [phone] If no response call [phone]     08/15/2017, 10:13 AM

## 2017-08-15 NOTE — Progress Notes (Signed)
Arterial line dressing and biopatch replaced. No breakdown or any skin complications noted during dressing change.

## 2017-08-15 NOTE — Progress Notes (Signed)
eLink Physician-Brief Progress Note Patient Name: April [name] DOB: April 22, 1971 MRN: [id number]   Date of Service  08/15/2017  HPI/Events of Note  Persistent hypotension despite cautious volume repletion in the context of severe ARDS and need for conservative fluid admin.  eICU Interventions  Vasopressin infusion, check serum cortisol level        Ok[place]on[place]wo U Ogan 08/15/2017, 3:54 AM

## 2017-08-16 ENCOUNTER — Inpatient Hospital Stay (HOSPITAL_COMMUNITY): Payer: Medicaid Other

## 2017-08-16 DIAGNOSIS — N179 Acute kidney failure, unspecified: Secondary | ICD-10-CM

## 2017-08-16 LAB — BLOOD GAS, ARTERIAL
ACID-BASE EXCESS: 3.3 mmol/L — AB (ref 0.0–2.0)
Bicarbonate: 29.9 mmol/L — ABNORMAL HIGH (ref 20.0–28.0)
DRAWN BY: 422461
FIO2: 70
MECHVT: 480 mL
O2 Saturation: 88 %
PCO2 ART: 62.2 mmHg — AB (ref 32.0–48.0)
PEEP: 12 cmH2O
PH ART: 7.304 — AB (ref 7.350–7.450)
Patient temperature: 98.7
RATE: 30 resp/min
pO2, Arterial: 60.5 mmHg — ABNORMAL LOW (ref 83.0–108.0)

## 2017-08-16 LAB — CBC
HCT: 26.7 % — ABNORMAL LOW (ref 36.0–46.0)
Hemoglobin: 8.3 g/dL — ABNORMAL LOW (ref 12.0–15.0)
MCH: 28.2 pg (ref 26.0–34.0)
MCHC: 31.1 g/dL (ref 30.0–36.0)
MCV: 90.8 fL (ref 78.0–100.0)
PLATELETS: 135 10*3/uL — AB (ref 150–400)
RBC: 2.94 MIL/uL — ABNORMAL LOW (ref 3.87–5.11)
RDW: 18.3 % — AB (ref 11.5–15.5)
WBC: 18.5 10*3/uL — ABNORMAL HIGH (ref 4.0–10.5)

## 2017-08-16 LAB — BASIC METABOLIC PANEL
Anion gap: 8 (ref 5–15)
BUN: 52 mg/dL — AB (ref 6–20)
CHLORIDE: 114 mmol/L — AB (ref 101–111)
CO2: 28 mmol/L (ref 22–32)
Calcium: 7.8 mg/dL — ABNORMAL LOW (ref 8.9–10.3)
Creatinine, Ser: 0.79 mg/dL (ref 0.44–1.00)
GFR calc non Af Amer: >60 mL/min (ref >60)
Glucose, Bld: 188 mg/dL — ABNORMAL HIGH (ref 65–99)
Potassium: 3.8 mmol/L (ref 3.5–5.1)
SODIUM: 150 mmol/L — AB (ref 135–145)

## 2017-08-16 LAB — GLUCOSE, CAPILLARY
GLUCOSE-CAPILLARY: 240 mg/dL — AB (ref 65–99)
GLUCOSE-CAPILLARY: 150 mg/dL — AB (ref 65–99)
GLUCOSE-CAPILLARY: 168 mg/dL — AB (ref 65–99)
Glucose-Capillary: 151 mg/dL — ABNORMAL HIGH (ref 65–99)
Glucose-Capillary: 178 mg/dL — ABNORMAL HIGH (ref 65–99)
Glucose-Capillary: 209 mg/dL — ABNORMAL HIGH (ref 65–99)

## 2017-08-16 LAB — MAGNESIUM: MAGNESIUM: 2.4 mg/dL (ref 1.7–2.4)

## 2017-08-16 LAB — PHOSPHORUS: PHOSPHORUS: 2.6 mg/dL (ref 2.5–4.6)

## 2017-08-16 MED ORDER — INSULIN GLARGINE 100 UNIT/ML ~~LOC~~ SOLN
25.0000 [IU] | Freq: Every day | SUBCUTANEOUS | Status: DC
  Administered 2017-08-17 – 2017-08-18 (×2): 25 [IU] via SUBCUTANEOUS
  Filled 2017-08-16 (×2): qty 0.25

## 2017-08-16 MED ORDER — FAMOTIDINE IN NACL 20-0.9 MG/50ML-% IV SOLN
20.0000 mg | Freq: Two times a day (BID) | INTRAVENOUS | Status: DC
  Administered 2017-08-16 – 2017-08-18 (×4): 20 mg via INTRAVENOUS
  Filled 2017-08-16 (×4): qty 50

## 2017-08-16 MED ORDER — METOPROLOL TARTRATE 5 MG/5ML IV SOLN
2.5000 mg | INTRAVENOUS | Status: DC | PRN
  Administered 2017-08-16 – 2017-08-17 (×2): 5 mg via INTRAVENOUS
  Filled 2017-08-16 (×2): qty 5

## 2017-08-16 MED ORDER — ACETAMINOPHEN 160 MG/5ML PO SOLN
650.0000 mg | ORAL | Status: DC | PRN
  Administered 2017-08-16 – 2017-08-17 (×3): 650 mg
  Filled 2017-08-16 (×3): qty 20.3

## 2017-08-16 MED ORDER — FUROSEMIDE 10 MG/ML IJ SOLN
40.0000 mg | Freq: Two times a day (BID) | INTRAMUSCULAR | Status: AC
  Administered 2017-08-16 (×2): 40 mg via INTRAVENOUS
  Filled 2017-08-16 (×2): qty 4

## 2017-08-16 NOTE — Progress Notes (Signed)
PULMONARY / CRITICAL CARE MEDICINE   Name: April [name] MRN: [id number] DOB: Sep 21, 1970    ADMISSION DATE:  08/06/2017 CONSULTATION DATE:  08/11/17  REFERRING MD:  Dr. Karleen Hampshire / [facility]   CHIEF COMPLAINT:  Acute Respiratory Failure   BRIEF SUMMARY: 47 y/o F, recently discharged after lower respiratory tract infection/asthma exacerbation, admitted 4/10 with concern for PNA, AF, dCHF and sub-optimal medical compliance.  She was intubated in the early am of 4/15 by anesthesia.  Flu A positive.  Progressive infiltrates / ARDS.  Paralytics initiated 4/17.     STUDIES:  ECHO 4/8 >> LVEF estimated at 60-65%, normal wall motion, LA mildly dilated   CULTURES: BCx2 4/10 >> negative RVP 4/10 >> Flu A positive  Legionella 4/15 >> negative Strep Antigen 4/15 >> negative BCx2 4/17 >> ng   ANTIBIOTICS: Azithromycin 4/14 >> Tamiflu 4/16 >>  Cefepime 4/17 >>  Vanco 4/17 >>   SIGNIFICANT EVENTS: 4/10  Admit  4/13  PCCM consulted  4/15  Pt intubated for increased work of breathing 4/16  Hypotension, aline placed, peripheral neo 4/17  ARDS protocol, paralytics, TLC / Aline   LINES/TUBES: ETT 4/15 >>  ALine 4/17 >> L [place] TLC 4/17 >>  SUBJECTIVE:   Remains critically ill, deeply sedated, paralysed on nimbex ON 60%/+10 with episode of desatn On low dose  levo + vaso gtt  Good UO  9 beat run of VT   VITAL SIGNS: BP (!) 136/49 (BP Location: Left Arm)   Pulse (!) 125   Temp (!) 100.5 F (38.1 C) (Axillary)   Resp (!) 30   Ht 5\' 6"  (1.676 m)   Wt (!) 343 lb 4.1 oz (155.7 kg)   LMP  (LMP Unknown)   SpO2 93%   BMI 55.40 kg/m   HEMODYNAMICS:  CVP:  [14 mmHg-20 mmHg] 18 mmHg Initially on increasing phenylephrine infusion. Now hypertensive since paralyzed and compliant with ventilation.  VENTILATOR SETTINGS: Vent Mode: PRVC FiO2 (%):  [60 %-70 %] 60 % Set Rate:  [30 bmp] 30 bmp Vt Set:  [480 mL] 480 mL PEEP:  [10 cmH20-12 cmH20] 12 cmH20 Plateau Pressure:  [32 cmH20-36 cmH20] 32  cmH20  INTAKE / OUTPUT: I/O last 3 completed shifts: In: 6460.5 [I.V.:4230.4; NG/GT:2030.2; IV Piggyback:200] Out: 5555 [Urine:5555]  PHYSICAL EXAMINATION: General: critically ill bese woman on vent  HEENT:  ETT , no pallor, icterus Neuro: sedated / paralyzed, RASS -5   CV: s1s2 rrr, no m/r/g PULM: even/non-labored, lungs bilaterally coarse  DQ:QIWL, non-tender, bsx4 active  Extremities: warm/dry, no overt edema  Skin: no rashes or lesions, L [place] TLC  LABS:  BMET Recent Labs  Lab 08/14/17 0901 08/15/17 0440 08/16/17 0524  NA 140 144 150*  K 3.7 3.8 3.8  CL 107 109 114*  CO2 21* 25 28  BUN 85* 74* 52*  CREATININE 2.75* 1.73* 0.79  GLUCOSE 159* 224* 188*    Electrolytes Recent Labs  Lab 08/14/17 0630 08/14/17 0901 08/15/17 0440 08/16/17 0524  CALCIUM  --  7.1* 7.3* 7.8*  MG 2.2  --  2.2 2.4  PHOS 5.6*  --  3.7 2.6    CBC Recent Labs  Lab 08/13/17 0300 08/15/17 0440 08/16/17 0524  WBC 18.4* 18.2* 18.5*  HGB 10.4* 8.5* 8.3*  HCT 33.2* 26.9* 26.7*  PLT 149* 126* 135*    Coag's No results for input(s): APTT, INR in the last 168 hours.  Sepsis Markers Recent Labs  Lab 08/10/17 0319 08/11/17 0255 08/11/17 7989  LATICACIDVEN  --   --  1.2  PROCALCITON <0.10 <0.10 0.15    ABG Recent Labs  Lab 08/15/17 1012 08/15/17 1545 08/16/17 0442  PHART 7.331* 7.329* 7.304*  PCO2ART 54.4* 55.0* 62.2*  PO2ART 48.4* 62.0* 60.5*    Liver Enzymes No results for input(s): AST, ALT, ALKPHOS, BILITOT, ALBUMIN in the last 168 hours.  Cardiac Enzymes Recent Labs  Lab 08/11/17 0702  TROPONINI <0.03    Glucose Recent Labs  Lab 08/15/17 1533 08/15/17 1942 08/15/17 2316 08/16/17 0334 08/16/17 0728 08/16/17 1101  GLUCAP 189* 196* 195* 151* 178* 240*    Imaging Dg Chest Port 1 View  Result Date: 08/16/2017 CLINICAL DATA:  Acute respiratory failure.  Ex-smoker. EXAM: PORTABLE CHEST 1 VIEW COMPARISON:  Yesterday. FINDINGS: Endotracheal tube in  satisfactory position. Left subclavian catheter tip in the superior vena cava. Nasogastric tube extending into the stomach. The borders are obscured with no gross change in size of the cardiac silhouette. Marked bilateral diffuse airspace opacity, mildly increased. Normal appearing bones. IMPRESSION: Mild increase in marked diffuse bilateral pneumonia, alveolar edema or ARDS. Electronically Signed   By: Claudie Revering M.D.   On: 08/16/2017 07:43     DISCUSSION: 47 y/o F admitted with concern for AECOPD and possible LLL PNA. RVP positive for influenza A. Intubated for ARDS. AKI - improving  BNP negative and ECHO as above, doubt CHF.   ASSESSMENT / PLAN:  PULMONARY A: ARDS Influenza A Pneumonitis Acute Exacerbation of COPD  Possible CAP  Tobacco Abuse  COPD-FEV1 57% 2018 OSA - mild, as of 2008 sleep study  P:   Continue lung protective ventilation 8cc/kg RR @ 30 Increase PEEP 12 Ct Nimbex  , worsening hypoxia, may need proning Continue Duoneb + PRN albuterol Continue singulair Droplet precautions  Diurese  CARDIOVASCULAR A:  Hypotension - likely due to very negative intrathoracic pressures.  H/o Severe HTN - ? Related to hyperaldosteronism Diastolic CHF - grade 1 by most recent ECHO, but low BNP Atrial Fibrillation  NSVT 4/20 P:  Continue apixaban  Lopressor (reduced dose) 12.5 mg  BID for AF Levophed as needed for MAP >65 due to sedation, dc vaso Hold losartan, spironolactone, diltiazem   RENAL A:   AKI - improving, non oliguric Metab acidosis  -resolved P:   Lasix 40 q 12h Trend BMP / urinary output Replace electrolytes as indicated Avoid nephrotoxic agents, ensure adequate renal perfusion  GASTROINTESTINAL A:   GERD Morbid Obesity  P:   NPO / OGT Pepcid for SUP  Ct TF   HEMATOLOGIC A:   Influenza A Positive  Leukocytosis - ? Related to steroids, essentially negative PCT  P:  Trend CBC  Continue Eliquis for DVT prophylaxis   INFECTIOUS A:    Possible CAP - ? LLL infiltrate, PCT negative, leukocytosis ? related to steroids  Influenza A  P:   Tamiflu  Continue abx  Monitor fever curve / WBC trend    ENDOCRINE A:   DM with Steroid Induced Hyperglycemia    P:   SSI, resistant scale  Increase Lantus 25 units QD   TF coverage 5 units Q4   NEUROLOGIC A:   ICU Associated Pain  Anxiety / Depression  P:   RASS goal: -5 with paralytics  Fentanyl + Versed for sedation  Continue seroquel  Zoloft/ buspar on hold   FAMILY  - Updates:   daughter Bonne Dolores)  updated 4/20  The patient is critically ill with multiple organ systems failure and requires high  complexity decision making for assessment and support, frequent evaluation and titration of therapies, application of advanced monitoring technologies and extensive interpretation of multiple databases. Critical Care Time devoted to patient care services described in this note independent of APP/resident  time is 35 minutes.   Kara Mead MD. Shade Flood.  Pulmonary & Critical care Pager [phone] If no response call [phone]     08/16/2017, 11:39 AM

## 2017-08-16 NOTE — Progress Notes (Signed)
BIS strip on pt's forehead changed

## 2017-08-16 NOTE — Progress Notes (Signed)
Pt has fluid filled blisters around labia, Dr Elsworth Soho at bedside and aware.

## 2017-08-16 NOTE — Progress Notes (Signed)
Dr Elsworth Soho made aware of 9 beat run of v-tach

## 2017-08-17 ENCOUNTER — Inpatient Hospital Stay (HOSPITAL_COMMUNITY): Payer: Medicaid Other

## 2017-08-17 DIAGNOSIS — J1100 Influenza due to unidentified influenza virus with unspecified type of pneumonia: Secondary | ICD-10-CM

## 2017-08-17 LAB — BLOOD GAS, ARTERIAL
Acid-Base Excess: 2.6 mmol/L — ABNORMAL HIGH (ref 0.0–2.0)
Acid-Base Excess: 0.1 mmol/L (ref 0.0–2.0)
Bicarbonate: 30 mmol/L — ABNORMAL HIGH (ref 20.0–28.0)
Bicarbonate: 27.7 mmol/L (ref 20.0–28.0)
DRAWN BY: 308601
DRAWN BY: 295031
FIO2: 75
FIO2: 100
MECHVT: 480 mL
MECHVT: 480 mL
O2 SAT: 91.7 %
O2 Saturation: 85.9 %
PCO2 ART: 67.7 mmHg — AB (ref 32.0–48.0)
PEEP: 12 cmH2O
PEEP: 16 cmH2O
PH ART: 7.235 — AB (ref 7.350–7.450)
PO2 ART: 69.5 mmHg — AB (ref 83.0–108.0)
Patient temperature: 101.4
Patient temperature: 98.6
RATE: 30 resp/min
RATE: 30 resp/min
pCO2 arterial: 69.1 mmHg (ref 32.0–48.0)
pH, Arterial: 7.271 — ABNORMAL LOW (ref 7.350–7.450)
pO2, Arterial: 63.4 mmHg — ABNORMAL LOW (ref 83.0–108.0)

## 2017-08-17 LAB — BASIC METABOLIC PANEL
Anion gap: 5 (ref 5–15)
BUN: 44 mg/dL — ABNORMAL HIGH (ref 6–20)
CALCIUM: 6.4 mg/dL — AB (ref 8.9–10.3)
CO2: 26 mmol/L (ref 22–32)
CREATININE: 0.94 mg/dL (ref 0.44–1.00)
Chloride: 124 mmol/L — ABNORMAL HIGH (ref 101–111)
GFR calc Af Amer: >60 mL/min (ref >60)
GFR calc non Af Amer: >60 mL/min (ref >60)
GLUCOSE: 121 mg/dL — AB (ref 65–99)
Potassium: 3 mmol/L — ABNORMAL LOW (ref 3.5–5.1)
Sodium: 155 mmol/L — ABNORMAL HIGH (ref 135–145)

## 2017-08-17 LAB — GLUCOSE, CAPILLARY
GLUCOSE-CAPILLARY: 136 mg/dL — AB (ref 65–99)
GLUCOSE-CAPILLARY: 168 mg/dL — AB (ref 65–99)
GLUCOSE-CAPILLARY: 146 mg/dL — AB (ref 65–99)
Glucose-Capillary: 109 mg/dL — ABNORMAL HIGH (ref 65–99)
Glucose-Capillary: 114 mg/dL — ABNORMAL HIGH (ref 65–99)
Glucose-Capillary: 156 mg/dL — ABNORMAL HIGH (ref 65–99)

## 2017-08-17 LAB — CBC
HEMATOCRIT: 33.5 % — AB (ref 36.0–46.0)
Hemoglobin: 9.7 g/dL — ABNORMAL LOW (ref 12.0–15.0)
MCH: 27.1 pg (ref 26.0–34.0)
MCHC: 29 g/dL — AB (ref 30.0–36.0)
MCV: 93.6 fL (ref 78.0–100.0)
Platelets: 100 10*3/uL — ABNORMAL LOW (ref 150–400)
RBC: 3.58 MIL/uL — ABNORMAL LOW (ref 3.87–5.11)
RDW: 18.5 % — AB (ref 11.5–15.5)
WBC: 12.4 10*3/uL — ABNORMAL HIGH (ref 4.0–10.5)

## 2017-08-17 LAB — CULTURE, RESPIRATORY W GRAM STAIN: Culture: NORMAL

## 2017-08-17 LAB — CULTURE, RESPIRATORY

## 2017-08-17 LAB — PHOSPHORUS: PHOSPHORUS: 2.1 mg/dL — AB (ref 2.5–4.6)

## 2017-08-17 LAB — MAGNESIUM: MAGNESIUM: 1.8 mg/dL (ref 1.7–2.4)

## 2017-08-17 MED ORDER — DEXTROSE 5 % IV SOLN
INTRAVENOUS | Status: DC
  Administered 2017-08-17: 50 mL/h via INTRAVENOUS
  Administered 2017-08-18: 07:00:00 via INTRAVENOUS

## 2017-08-17 MED ORDER — FUROSEMIDE 10 MG/ML IJ SOLN
40.0000 mg | Freq: Two times a day (BID) | INTRAMUSCULAR | Status: AC
  Administered 2017-08-17 (×2): 40 mg via INTRAVENOUS
  Filled 2017-08-17 (×2): qty 4

## 2017-08-17 MED ORDER — POTASSIUM CHLORIDE 10 MEQ/50ML IV SOLN
10.0000 meq | INTRAVENOUS | Status: AC
  Administered 2017-08-17 (×6): 10 meq via INTRAVENOUS
  Filled 2017-08-17 (×6): qty 50

## 2017-08-17 MED ORDER — SODIUM CHLORIDE 0.9 % IV SOLN
1.0000 g | Freq: Once | INTRAVENOUS | Status: AC
  Administered 2017-08-17: 1 g via INTRAVENOUS
  Filled 2017-08-17: qty 10

## 2017-08-17 NOTE — Progress Notes (Signed)
[facility] Progress Note Patient Name: April [name] DOB: 1971-02-13 MRN: [id number]   Date of Service  08/17/2017  HPI/Events of Note  K+ = 3.0, CA++ = 6.4 and Creatinine = 0.94.  eICU Interventions  Will replace K+ and Ca++.     Intervention Category Major Interventions: Electrolyte abnormality - evaluation and management  Tamarra Geiselman [place] 08/17/2017, 6:03 AM

## 2017-08-17 NOTE — Progress Notes (Signed)
Pt's mother, daughter, sister, best friend and brother-in-law were in the hallway when I arrived. They requested prayer for pt. Pt's brother-in-law was tearful and did not join [place] bedside for prayer as we held hands around pt's bed. They are a family of [employer] Me[employer] faith and were very appreciative of spiritual support. Please page if additional support is needed.  [place], [place]

## 2017-08-17 NOTE — Progress Notes (Signed)
Discussed with ECMO coordinator and MICU attending at [facility] on the wait list for ECMO transfer should she get worse.  Advice was to hold off transfer for now since she has stabilized with proning.  Should she get worse, call ECMO coordinator back at [phone]  Leanna Sato. Elsworth Soho MD

## 2017-08-17 NOTE — Progress Notes (Addendum)
Pt placed in prone position per MD order. RN's, RT and Dr Elsworth Soho at bedside for repositioning. Foam pads placed on pt's bony points, head cradle pillow ordered and used. Mouthcare done before repositioning. Pt still continues to have intermittent nose bleeds, Dr Elsworth Soho aware

## 2017-08-17 NOTE — Progress Notes (Signed)
BIS monitor strip on pt's forehead changed.

## 2017-08-17 NOTE — Progress Notes (Signed)
PULMONARY / CRITICAL CARE MEDICINE   Name: April [name] MRN: [id number] DOB: 1971-02-15    ADMISSION DATE:  08/06/2017 CONSULTATION DATE:  08/11/17  REFERRING MD:  Dr. Karleen Hampshire / [facility]   CHIEF COMPLAINT:  Acute Respiratory Failure   BRIEF SUMMARY: 47 y/o F, recently discharged after lower respiratory tract infection/asthma exacerbation, admitted 4/10 with concern for PNA, AF, dCHF and sub-optimal medical compliance.  She was intubated in the early am of 4/15 by anesthesia.  Flu A positive.  Progressive infiltrates / ARDS.  Paralytics initiated 4/17.     STUDIES:  ECHO 4/8 >> LVEF estimated at 60-65%, normal wall motion, LA mildly dilated   CULTURES: BCx2 4/10 >> negative RVP 4/10 >> Flu A positive  Legionella 4/15 >> negative Strep Antigen 4/15 >> negative BCx2 4/17 >> ng   ANTIBIOTICS: Azithromycin 4/14 >> Tamiflu 4/16 >>  Cefepime 4/17 >>  Vanco 4/17 >>   SIGNIFICANT EVENTS: 4/10  Admit  4/13  PCCM consulted  4/15  Pt intubated for increased work of breathing 4/16  Hypotension, aline placed, peripheral neo 4/17  ARDS protocol, paralytics, TLC / Aline  4/20 9 beat run of VT, ON 60%/+10 with episode of desatn  LINES/TUBES: ETT 4/15 >>  ALine 4/17 >> L [place] TLC 4/17 >>  SUBJECTIVE:   Remains critically ill, deeply sedated, paralysed on nimbex Worsening hypoxia overnight requiring increased FiO2 100% around 3 AM and PEEP of 12 On low dose  levo  Good UO with lasix but remains in positive balance about 10L  bleeding from nose noted by RN   VITAL SIGNS: BP (!) 127/56 (BP Location: Left Arm)   Pulse (!) 119   Temp 98.7 F (37.1 C) (Axillary)   Resp (!) 30   Ht 5\' 6"  (1.676 m)   Wt (!) 347 lb 10.7 oz (157.7 kg)   LMP  (LMP Unknown)   SpO2 90%   BMI 56.11 kg/m   HEMODYNAMICS:  CVP:  [14 mmHg-26 mmHg] 19 mmHg  VENTILATOR SETTINGS: Vent Mode: PRVC FiO2 (%):  [60 %-100 %] 100 % Set Rate:  [30 bmp] 30 bmp Vt Set:  [480 mL] 480 mL PEEP:  [12 cmH20-16 cmH20] 16  cmH20 Plateau Pressure:  [31 cmH20-33 cmH20] 32 cmH20  INTAKE / OUTPUT: I/O last 3 completed shifts: In: 5803.2 [I.V.:2843.2; NG/GT:1950; IV Piggyback:1010] Out: 7408 [Urine:4050]  PHYSICAL EXAMINATION: General: critically ill bese woman on vent  HEENT:  ETT , no pallor, icterus, bleeding around proximal nares Neuro: sedated / paralyzed, RASS -5   CV: s1s2 rrr, no m/r/g PULM: even/non-labored, lungs bilaterally coarse and decreased XK:GYJE, non-tender, bsx4 active  Extremities: warm/dry, no overt edema  Skin: no rashes or lesions, L [place] TLC  LABS:  BMET Recent Labs  Lab 08/15/17 0440 08/16/17 0524 08/17/17 0413  NA 144 150* 155*  K 3.8 3.8 3.0*  CL 109 114* 124*  CO2 25 28 26   BUN 74* 52* 44*  CREATININE 1.73* 0.79 0.94  GLUCOSE 224* 188* 121*    Electrolytes Recent Labs  Lab 08/15/17 0440 08/16/17 0524 08/17/17 0413  CALCIUM 7.3* 7.8* 6.4*  MG 2.2 2.4 1.8  PHOS 3.7 2.6 2.1*    CBC Recent Labs  Lab 08/15/17 0440 08/16/17 0524 08/17/17 0413  WBC 18.2* 18.5* 12.4*  HGB 8.5* 8.3* 9.7*  HCT 26.9* 26.7* 33.5*  PLT 126* 135* 100*    Coag's No results for input(s): APTT, INR in the last 168 hours.  Sepsis Markers Recent Labs  Lab  08/11/17 0255 08/11/17 0702  LATICACIDVEN  --  1.2  PROCALCITON <0.10 0.15    ABG Recent Labs  Lab 08/15/17 1545 08/16/17 0442 08/17/17 0320  PHART 7.329* 7.304* 7.271*  PCO2ART 55.0* 62.2* 69.1*  PO2ART 62.0* 60.5* 63.4*    Liver Enzymes No results for input(s): AST, ALT, ALKPHOS, BILITOT, ALBUMIN in the last 168 hours.  Cardiac Enzymes Recent Labs  Lab 08/11/17 0702  TROPONINI <0.03    Glucose Recent Labs  Lab 08/16/17 1101 08/16/17 1512 08/16/17 1934 08/16/17 2322 08/17/17 0327 08/17/17 0739  GLUCAP 240* 209* 150* 168* 136* 156*    Imaging Dg Chest Port 1 View  Result Date: 08/17/2017 CLINICAL DATA:  Acute respiratory failure. EXAM: PORTABLE CHEST 1 VIEW COMPARISON:  08/16/2017 FINDINGS:  Endotracheal tube terminates at the inferior margin of the clavicular heads, unchanged and well above the carina. Left subclavian catheter terminates over the mid SVC. Enteric tube courses into the left upper abdomen with tip not imaged. The cardiomediastinal silhouette is grossly unchanged allowing for differences in patient rotation as well as partial obscuration of the heart border. Extensive airspace opacity throughout both lungs has not significantly changed. No large pleural effusion or pneumothorax is identified. IMPRESSION: Unchanged diffuse bilateral airspace disease. Electronically Signed   By: Logan Bores M.D.   On: 08/17/2017 07:37     DISCUSSION: 47 y/o F admitted with concern for AECOPD and possible LLL PNA. RVP positive for influenza A. Intubated for ARDS.  AKI - improving BNP negative and ECHO as above, doubt CHF.   ASSESSMENT / PLAN:  PULMONARY A: ARDS Influenza A Pneumonitis Acute Exacerbation of COPD  Possible CAP  Tobacco Abuse  COPD-FEV1 57% 2018 OSA - mild, as of 2008 sleep study  P:   Continue lung protective ventilation 8cc/kg, cannot lower due to high PCO2 RR @ 30 Increase PEEP 16  prone position today for 16 hours Ct Nimbex  Continue Duoneb + PRN albuterol Continue singulair Droplet precautions    CARDIOVASCULAR A:  Hypotension - likely due to high PEEP H/o Severe HTN - ? Related to hyperaldosteronism Diastolic CHF - grade 1 by most recent ECHO, but low BNP Atrial Fibrillation  NSVT 4/20 P:  Continue apixaban -may have to hold if nasal bleeding worsens Dc Lopressor  Levophed as needed for MAP >65 due to sedation, dc vaso Hold losartan, spironolactone, diltiazem   RENAL A:   AKI - improving, non oliguric Metab acidosis  -resolved Hyponatremia, induced by diuresis P:   Lasix 40 q 12h for equal balance,will add D5W for rising sodium  Trend BMP / urinary output Replace electrolytes as indicated-K-Phos Avoid nephrotoxic agents, ensure  adequate renal perfusion  GASTROINTESTINAL A:   GERD Morbid Obesity  P:   NPO / OGT Pepcid for SUP  TFs Will be held when prone   HEMATOLOGIC A:   Influenza A Positive  Leukocytosis - ? Related to steroids, essentially negative PCT  P:  Trend CBC  Hold  Eliquis, use sq heparin  for DVT prophylaxis   INFECTIOUS A:   Possible CAP - ? LLL infiltrate, PCT negative, leukocytosis ? related to steroids  Influenza A  P:   Ct Tamiflu  Continue abx  Monitor fever curve / WBC trend    ENDOCRINE A:   DM with Steroid Induced Hyperglycemia    P:   SSI, resistant scale  Increased Lantus 25 units QD   TF coverage 5 units Q4   NEUROLOGIC A:   ICU Associated Pain  Anxiety / Depression  P:   RASS goal: -5 with paralytics  Fentanyl + Versed for sedation  Continue seroquel  Zoloft/ buspar on hold   FAMILY  - Updates:   daughter Bonne Dolores)  updated 4/21 , explained how critical it was she is and that we are going to trial prone position  Summary-severe ARDS with underlying COPD and morbid obesity, trying prone position today for 16 hours if oxygenation does not improve may have to consider ECMO.  She will be a difficult transport  The patient is critically ill with multiple organ systems failure and requires high complexity decision making for assessment and support, frequent evaluation and titration of therapies, application of advanced monitoring technologies and extensive interpretation of multiple databases. Critical Care Time devoted to patient care services described in this note independent of APP/resident  time is 45 minutes.   Kara Mead MD. Shade Flood. [place] [facility] & Critical care Pager [phone] If no response call [phone]     08/17/2017, 11:31 AM

## 2017-08-17 NOTE — Progress Notes (Signed)
Pt's nurses were present upon my arrival and said the family had requested Chaplain to come pray again. Family was not present. I had prayer bedside of pt; pt was intubated and resting. Please page any time additional support/prayers are needed. [place], MDiv   08/17/17 1600  Clinical Encounter Type  Visited With Health care provider

## 2017-08-18 ENCOUNTER — Inpatient Hospital Stay (HOSPITAL_COMMUNITY): Payer: Medicaid Other

## 2017-08-18 DIAGNOSIS — J111 Influenza due to unidentified influenza virus with other respiratory manifestations: Secondary | ICD-10-CM

## 2017-08-18 DIAGNOSIS — J9601 Acute respiratory failure with hypoxia: Secondary | ICD-10-CM

## 2017-08-18 DIAGNOSIS — N179 Acute kidney failure, unspecified: Secondary | ICD-10-CM | POA: Insufficient documentation

## 2017-08-18 DIAGNOSIS — R6521 Severe sepsis with septic shock: Secondary | ICD-10-CM

## 2017-08-18 DIAGNOSIS — J101 Influenza due to other identified influenza virus with other respiratory manifestations: Secondary | ICD-10-CM | POA: Insufficient documentation

## 2017-08-18 DIAGNOSIS — R0902 Hypoxemia: Secondary | ICD-10-CM | POA: Insufficient documentation

## 2017-08-18 DIAGNOSIS — J80 Acute respiratory distress syndrome: Secondary | ICD-10-CM

## 2017-08-18 DIAGNOSIS — R579 Shock, unspecified: Secondary | ICD-10-CM | POA: Insufficient documentation

## 2017-08-18 LAB — BASIC METABOLIC PANEL
ANION GAP: 9 (ref 5–15)
Anion gap: 8 (ref 5–15)
BUN: 77 mg/dL — AB (ref 6–20)
BUN: 81 mg/dL — ABNORMAL HIGH (ref 6–20)
CHLORIDE: 118 mmol/L — AB (ref 101–111)
CO2: 25 mmol/L (ref 22–32)
CO2: 24 mmol/L (ref 22–32)
CREATININE: 2.24 mg/dL — AB (ref 0.44–1.00)
Calcium: 7.7 mg/dL — ABNORMAL LOW (ref 8.9–10.3)
Calcium: 7.9 mg/dL — ABNORMAL LOW (ref 8.9–10.3)
Chloride: 116 mmol/L — ABNORMAL HIGH (ref 101–111)
Creatinine, Ser: 2.51 mg/dL — ABNORMAL HIGH (ref 0.44–1.00)
GFR calc Af Amer: 30 mL/min — ABNORMAL LOW (ref >60)
GFR calc non Af Amer: 25 mL/min — ABNORMAL LOW (ref >60)
GFR calc non Af Amer: 22 mL/min — ABNORMAL LOW (ref >60)
GFR, EST AFRICAN AMERICAN: 26 mL/min — AB (ref >60)
GLUCOSE: 192 mg/dL — AB (ref 65–99)
Glucose, Bld: 197 mg/dL — ABNORMAL HIGH (ref 65–99)
POTASSIUM: 5.3 mmol/L — AB (ref 3.5–5.1)
Potassium: 5.4 mmol/L — ABNORMAL HIGH (ref 3.5–5.1)
SODIUM: 149 mmol/L — AB (ref 135–145)
Sodium: 151 mmol/L — ABNORMAL HIGH (ref 135–145)

## 2017-08-18 LAB — BLOOD GAS, ARTERIAL
ACID-BASE DEFICIT: 5.4 mmol/L — AB (ref 0.0–2.0)
Acid-base deficit: 2.7 mmol/L — ABNORMAL HIGH (ref 0.0–2.0)
BICARBONATE: 21.3 mmol/L (ref 20.0–28.0)
Bicarbonate: 24.4 mmol/L (ref 20.0–28.0)
Drawn by: 308601
Drawn by: 257701
FIO2: 100
FIO2: 100
MECHVT: 480 mL
MECHVT: 480 mL
O2 Saturation: 92 %
O2 Saturation: 98.3 %
PATIENT TEMPERATURE: 98.6
PATIENT TEMPERATURE: 98.6
PCO2 ART: 51.9 mmHg — AB (ref 32.0–48.0)
PEEP/CPAP: 16 cmH2O
PEEP: 16 cmH2O
PH ART: 7.237 — AB (ref 7.350–7.450)
PO2 ART: 70.4 mmHg — AB (ref 83.0–108.0)
PO2 ART: 139 mmHg — AB (ref 83.0–108.0)
RATE: 30 resp/min
RATE: 30 resp/min
pCO2 arterial: 60.3 mmHg — ABNORMAL HIGH (ref 32.0–48.0)
pH, Arterial: 7.23 — ABNORMAL LOW (ref 7.350–7.450)

## 2017-08-18 LAB — MAGNESIUM: MAGNESIUM: 2.2 mg/dL (ref 1.7–2.4)

## 2017-08-18 LAB — CBC
HEMATOCRIT: 26 % — AB (ref 36.0–46.0)
HEMOGLOBIN: 7.7 g/dL — AB (ref 12.0–15.0)
MCH: 28.2 pg (ref 26.0–34.0)
MCHC: 29.6 g/dL — AB (ref 30.0–36.0)
MCV: 95.2 fL (ref 78.0–100.0)
Platelets: 137 10*3/uL — ABNORMAL LOW (ref 150–400)
RBC: 2.73 MIL/uL — ABNORMAL LOW (ref 3.87–5.11)
RDW: 19.5 % — ABNORMAL HIGH (ref 11.5–15.5)
WBC: 19.6 10*3/uL — ABNORMAL HIGH (ref 4.0–10.5)

## 2017-08-18 LAB — GLUCOSE, CAPILLARY
GLUCOSE-CAPILLARY: 173 mg/dL — AB (ref 65–99)
Glucose-Capillary: 156 mg/dL — ABNORMAL HIGH (ref 65–99)
Glucose-Capillary: 160 mg/dL — ABNORMAL HIGH (ref 65–99)

## 2017-08-18 LAB — CULTURE, BLOOD (ROUTINE X 2)
Culture: NO GROWTH
Culture: NO GROWTH
SPECIAL REQUESTS: ADEQUATE
Special Requests: ADEQUATE

## 2017-08-18 LAB — PREPARE RBC (CROSSMATCH)

## 2017-08-18 LAB — PHOSPHORUS: Phosphorus: 4.3 mg/dL (ref 2.5–4.6)

## 2017-08-18 LAB — ABO/RH: ABO/RH(D): B POS

## 2017-08-18 MED ORDER — SODIUM CHLORIDE 0.9 % IV SOLN: 2.0000 g | Freq: Three times a day (TID) | INTRAVENOUS | Status: DC

## 2017-08-18 MED ORDER — HEPARIN SODIUM (PORCINE) 5000 UNIT/ML IJ SOLN
5000.0000 [IU] | Freq: Three times a day (TID) | INTRAMUSCULAR | Status: DC
  Administered 2017-08-18: 5000 [IU] via SUBCUTANEOUS
  Filled 2017-08-18: qty 1

## 2017-08-18 MED ORDER — NOREPINEPHRINE BITARTRATE 1 MG/ML IV SOLN: 0.0000 ug/min | INTRAVENOUS | Status: DC

## 2017-08-18 MED ORDER — HEPARIN SODIUM (PORCINE) 5000 UNIT/ML IJ SOLN: 5000.0000 [IU] | Freq: Three times a day (TID) | INTRAMUSCULAR | Status: DC

## 2017-08-18 MED ORDER — STERILE WATER FOR INJECTION IV SOLN: 125.0000 mL/h | INTRAVENOUS | 0 refills | Status: DC

## 2017-08-18 MED ORDER — INSULIN GLARGINE 100 UNIT/ML ~~LOC~~ SOLN: 25.0000 [IU] | Freq: Every day | SUBCUTANEOUS | 11 refills | Status: DC

## 2017-08-18 MED ORDER — DEXTROSE 50 % IV SOLN
INTRAVENOUS | Status: AC
  Filled 2017-08-18: qty 50

## 2017-08-18 MED ORDER — FENTANYL CITRATE (PF) 100 MCG/2ML IJ SOLN
100.0000 ug | Freq: Once | INTRAMUSCULAR | 0 refills | Status: AC
Start: 2017-08-18 — End: 2017-08-18

## 2017-08-18 MED ORDER — CHLORHEXIDINE GLUCONATE 0.12% ORAL RINSE (MEDLINE KIT): 15.0000 mL | Freq: Two times a day (BID) | OROMUCOSAL | 0 refills | Status: DC

## 2017-08-18 MED ORDER — AZITHROMYCIN 250 MG PO TABS: 500.0000 mg | ORAL_TABLET | Freq: Every day | ORAL | 0 refills | Status: DC

## 2017-08-18 MED ORDER — FENTANYL BOLUS VIA INFUSION: 50.0000 ug | INTRAVENOUS | 0 refills | Status: DC | PRN

## 2017-08-18 MED ORDER — HYDRALAZINE HCL 20 MG/ML IJ SOLN: 10.0000 mg | INTRAMUSCULAR | Status: DC | PRN

## 2017-08-18 MED ORDER — STERILE WATER FOR INJECTION IV SOLN
INTRAVENOUS | Status: DC
  Administered 2017-08-18: 10:00:00 via INTRAVENOUS
  Filled 2017-08-18 (×2): qty 850

## 2017-08-18 MED ORDER — DEXTROSE 5 % IV SOLN: 50.0000 mL/h | INTRAVENOUS | Status: DC

## 2017-08-18 MED ORDER — METOPROLOL TARTRATE 5 MG/5ML IV SOLN: 2.5000 mg | INTRAVENOUS | Status: DC | PRN

## 2017-08-18 MED ORDER — SODIUM CHLORIDE 0.9 % IV BOLUS: 500.0000 mL | Freq: Once | INTRAVENOUS | 0 refills | Status: AC

## 2017-08-18 MED ORDER — SODIUM CHLORIDE 0.9 % IV SOLN: 0.5000 ug/kg/min | INTRAVENOUS | Status: DC

## 2017-08-18 MED ORDER — IPRATROPIUM-ALBUTEROL 0.5-2.5 (3) MG/3ML IN SOLN: 3.0000 mL | RESPIRATORY_TRACT | Status: DC

## 2017-08-18 MED ORDER — ALBUTEROL SULFATE (2.5 MG/3ML) 0.083% IN NEBU: 2.5000 mg | INHALATION_SOLUTION | RESPIRATORY_TRACT | 12 refills | Status: DC | PRN

## 2017-08-18 MED ORDER — ARTIFICIAL TEARS OPHTHALMIC OINT: 1.0000 "application " | TOPICAL_OINTMENT | Freq: Three times a day (TID) | OPHTHALMIC | Status: DC

## 2017-08-18 MED ORDER — INSULIN ASPART 100 UNIT/ML ~~LOC~~ SOLN: 0.0000 [IU] | SUBCUTANEOUS | 11 refills | Status: DC

## 2017-08-18 MED ORDER — FAMOTIDINE IN NACL 20-0.9 MG/50ML-% IV SOLN: 20.0000 mg | Freq: Two times a day (BID) | INTRAVENOUS | Status: DC

## 2017-08-18 MED ORDER — ORAL CARE MOUTH RINSE: 15.0000 mL | Freq: Four times a day (QID) | OROMUCOSAL | 0 refills | Status: DC

## 2017-08-18 MED ORDER — INSULIN ASPART 100 UNIT/ML IV SOLN
10.0000 [IU] | Freq: Once | INTRAVENOUS | Status: AC
  Administered 2017-08-18: 10 [IU] via INTRAVENOUS

## 2017-08-18 MED ORDER — SODIUM CHLORIDE 0.9 % IV SOLN: Freq: Once | INTRAVENOUS | Status: DC

## 2017-08-18 MED ORDER — ONDANSETRON HCL 4 MG PO TABS: 4.0000 mg | ORAL_TABLET | Freq: Four times a day (QID) | ORAL | 0 refills | Status: DC | PRN

## 2017-08-18 MED ORDER — FENTANYL 2500MCG IN NS 250ML (10MCG/ML) PREMIX INFUSION: 100.0000 ug/h | INTRAVENOUS | Status: DC

## 2017-08-18 MED ORDER — CHLORHEXIDINE GLUCONATE CLOTH 2 % EX PADS: 6.0000 | MEDICATED_PAD | Freq: Every day | CUTANEOUS | Status: DC

## 2017-08-18 MED ORDER — OSELTAMIVIR PHOSPHATE 75 MG PO CAPS: 75.0000 mg | ORAL_CAPSULE | Freq: Two times a day (BID) | ORAL | Status: DC

## 2017-08-18 MED ORDER — FENTANYL CITRATE (PF) 100 MCG/2ML IJ SOLN: 100.0000 ug | Freq: Once | INTRAMUSCULAR | 0 refills | Status: DC | PRN

## 2017-08-18 MED ORDER — SODIUM CHLORIDE 0.9 % IV SOLN: 0.0000 mg/h | INTRAVENOUS | Status: DC

## 2017-08-18 MED ORDER — ACETAMINOPHEN 160 MG/5ML PO SOLN: 650.0000 mg | ORAL | 0 refills | Status: DC | PRN

## 2017-08-18 MED ORDER — MIDAZOLAM BOLUS VIA INFUSION: 2.0000 mg | INTRAVENOUS | Status: DC | PRN

## 2017-08-18 MED ORDER — DEXTROSE 50 % IV SOLN
50.0000 mL | Freq: Once | INTRAVENOUS | Status: AC
  Administered 2017-08-18: 50 mL via INTRAVENOUS

## 2017-08-18 NOTE — Progress Notes (Signed)
Updated pts sister at length at bedside regarding critical status, minimal improvement and need for ECMO, [facility] tx.  She is calling pts daughter who is on her way to the hospital.  Assured sister that we would keep them updated regarding plans/time for tx and that they can follow her to [facility] if they wish.    Nickolas Madrid, NP 08/18/2017  10:16 AM Pager: [phone] or [phone]

## 2017-08-18 NOTE — Progress Notes (Signed)
[id number]/OBTVMT [place],BSN,RN3,CCM/[phone]/pt transferred to [facility] via ambulance.

## 2017-08-18 NOTE — Progress Notes (Signed)
Pt due to be turned at 0330 am. Per CRNA- Pt was a very difficult intubation, and advises that either a CRNA or MD be present at bedside for prone-to-supine turn.  CRNA unable to attend bedside r/t emergent surgical case. E-link MD notified and orders given to wait to turn Pt until AM where MD or CRNA can be present at bedside for Patient safety.

## 2017-08-18 NOTE — Discharge Summary (Addendum)
Physician Discharge Summary  Patient ID: April [name] MRN: [id number] DOB/AGE: 11/07/1970 47 y.o.  Admit date: 08/06/2017 Discharge date: 08/18/2017    Discharge Diagnoses:  Active Problems:   Morbid obesity with body mass index of 50.0-59.9 in adult [facility])   Essential hypertension   OSA on CPAP   Knee pain, bilateral   Tobacco abuse in remission   Leukocytosis   GERD (gastroesophageal reflux disease)   Moderate persistent asthma with exacerbation   Chronic diastolic CHF (congestive heart failure) (HCC)   SIRS (systemic inflammatory response syndrome) (HCC)   COPD with acute exacerbation ([place])   New onset atrial fibrillation (HCC)   Acute respiratory failure with hypoxia (HCC)   Unspecified atrial fibrillation ([place])   Acute on chronic respiratory failure with hypoxemia ([place])   HCAP (healthcare-associated pneumonia)            D/c plan by Discharge Diagnosis   PULMONARY A: ARDS Influenza A Pneumonitis Acute Exacerbation of COPD  Possible CAP  Tobacco Abuse  COPD-FEV1 57% 2018 OSA - mild, as of 2008 sleep study  P:   Remains prone - need to flip supine - will [facility] at [facility] in case she decompensates  Needs ECMO - for bedside cannulation and tx to [facility] for ECMO today Continue lung protective ventilation 8cc/kg, cannot lower Vt due to high PCO2 Continue RR @ 30 continue PEEP 16  Continue paralytic  BDs  Tamiflu, abx as above    CARDIOVASCULAR A:  Hypotension - ?related in part to high peep needs.  H/o Severe HTN - ? Related to hyperaldosteronism Diastolic CHF - grade 1 by most recent ECHO, but low BNP Atrial Fibrillation  NSVT 4/20 P:  apixaban held r/t nasal bleeding  Continue low dose levophed and titrate as needed for MAP >65 due to sedation Holding home losartan, spironolactone, diltiazem   RENAL A:   AKI - improving, non oliguric Metab acidosis  -resolved Hyponatremia, induced by diuresis Hyperkalemia  P:   Hold further diuresis with  worsening AKI  Continue D5W  F/u chem at noon if remains at [facility]  Continue HCO3 gtt   GASTROINTESTINAL A:   GERD Morbid Obesity  P:   NPO / OGT Pepcid for SUP  D/c TF for now   HEMATOLOGIC A:   Influenza A Positive  Leukocytosis - ? Related to steroids, essentially negative PCT  P:  Trend CBC  Will add SQ heparin for DVT proph  Holding eliquis    INFECTIOUS A:   Possible CAP - ? LLL infiltrate, PCT negative, leukocytosis ? related to steroids  Influenza A  P:   Ct Tamiflu  Continue abx as above -- vanc, azithro, cefepime Monitor fever curve / WBC trend  Trend pct   ENDOCRINE A:   DM with Steroid Induced Hyperglycemia    P:   SSI, resistant scale  Continue lantus 25  D/c TF coverage for now   NEUROLOGIC A:   Sedation needs on vent  Anxiety / Depression  P:   RASS goal: -5 with paralytics  Fentanyl + Versed for sedation  Home Zoloft/ buspar on hold    Brief Summary: April [name] is a 47 y.o. y/o female with a PMH of chronic severe asthma, AFib, HTN, OSA, GERD, poor compliance with recent admission 4/2-4/10 after admission for acute respiratory failure thought r/t asthma flare v AECOPD. She returned the evening of 4/10 with worsening fever, dyspnea, wheezes and was admitted by hospitalist with HCAP, AECOPD and treated  with IV abx, steroids, BDs.  PCCM consulted 4/13 r/t worsening dyspnea, tachypnea.  She was diuresed but refused bipap at that time.  She continued to worsen and required intubation early am 4/15 by anesthesia for progressive hypoxia and increased WOB.  Then found to be flu A POS.  She has since then had progressive infiltrates/worsening ARDS despite prone positioning 4/21 and ongoing attempts at diuresis.  Course now c/b AKI r/t diuresis.     STUDIES:  ECHO 4/8 >> LVEF estimated at 60-65%, normal wall motion, LA mildly dilated   CULTURES: BCx2 4/10 >> negative RVP 4/10 >> Flu A positive  Legionella 4/15 >> negative Strep Antigen 4/15 >>  negative BCx2 4/17 >> ng   ANTIBIOTICS: Azithromycin 4/14 >> Tamiflu 4/16 >>  Cefepime 4/17 >>  Vanco 4/17 >>   SIGNIFICANT EVENTS: 4/10  Admit  4/13  PCCM consulted  4/15  Pt intubated for increased work of breathing 4/16  Hypotension, aline placed, peripheral neo 4/17  ARDS protocol, paralytics, TLC / Aline  4/20 9 beat run of VT, ON 60%/+10 with episode of desatn  LINES/TUBES: ETT 4/15 >>  ALine 4/17 >> L [place] TLC 4/17 >>    Vitals:   08/18/17 0341 08/18/17 0500 08/18/17 0729 08/18/17 0759  BP: (!) 121/46     Pulse: (!) 113     Resp: (!) 30     Temp: (!) 97.3 F (36.3 C)   98 F (36.7 C)  TempSrc: Axillary   Axillary  SpO2: 98%  95%   Weight:  (!) 160.2 kg (353 lb 2.8 oz)    Height:         Discharge Labs  BMET Recent Labs  Lab 08/14/17 0630 08/14/17 0901 08/15/17 0440 08/16/17 0524 08/17/17 0413 08/18/17 0500  NA  --  140 144 150* 155* 151*  K  --  3.7 3.8 3.8 3.0* 5.4*  CL  --  107 109 114* 124* 118*  CO2  --  21* _0 GLUCOSE  --  159* 224* 188* 121* 192*  BUN  --  85* 74* 52* 44* 77*  CREATININE 2.71* 2.75* 1.73* 0.79 0.94 2.24*  CALCIUM  --  7.1* 7.3* 7.8* 6.4* 7.7*  MG 2.2  --  2.2 2.4 1.8 2.2  PHOS 5.6*  --  3.7 2.6 2.1* 4.3     CBC  Recent Labs  Lab 08/16/17 0524 08/17/17 0413 08/18/17 0500  HGB 8.3* 9.7* 7.7*  HCT 26.7* 33.5* 26.0*  WBC 18.5* 12.4* 19.6*  PLT 135* 100* 137*   Anti-Coagulation No results for input(s): INR in the last 168 hours.       [name] artificial tears  1 application Both Eyes N9G  . azithromycin  500 mg Oral Daily  . chlorhexidine gluconate (MEDLINE KIT)  15 mL Mouth Rinse BID  . Chlorhexidine Gluconate Cloth  6 each Topical Q0600  . dextrose      . fentaNYL (SUBLIMAZE) injection  100 mcg Intravenous Once  . heparin injection (subcutaneous)  5,000 Units Subcutaneous Q8H  . insulin aspart  0-20 Units Subcutaneous Q4H  . insulin glargine  25 Units Subcutaneous Daily  . ipratropium-albuterol   3 mL Nebulization Q4H  . mouth rinse  15 mL Mouth Rinse QID  . montelukast  10 mg Oral QHS  . nystatin  5 mL Oral QID  . oseltamivir  75 mg Oral BID   . sodium chloride    . sodium chloride    .  ceFEPime (MAXIPIME) IV Stopped (08/18/17 0530)  . cisatracurium (NIMBEX) infusion 3.005 mcg/kg/min (08/18/17 0600)  . dextrose 50 mL/hr at 08/18/17 0640  . famotidine (PEPCID) IV 20 mg (08/18/17 3685)  . fentaNYL infusion INTRAVENOUS 175 mcg/hr (08/18/17 0647)  . midazolam (VERSED) infusion 4 mg/hr (08/18/17 0639)  . norepinephrine (LEVOPHED) 56m / 25103minfusion 6 mcg/min (08/18/17 0625)  .  sodium bicarbonate (isotonic) infusion in sterile water 125 mL/hr at 08/18/17 0944  . sodium chloride     Place/Maintain arterial line **AND** sodium chloride, acetaminophen (TYLENOL) oral liquid 160 mg/5 mL, albuterol, fentaNYL, fentaNYL (SUBLIMAZE) injection, hydrALAZINE, metoprolol tartrate, midazolam, ondansetron **OR** ondansetron (ZOFRAN) IV     Disposition:  to [facility] for ECMO  Discharged Condition: April PANIKas met maximum benefit of inpatient care and is medically stable and cleared for discharge.  Patient is pending follow up as above.      Time spent on disposition:  Greater than 35 minutes.   Signed: KaNickolas MadridNP 08/18/2017  10:00 AM Pager: [phone] or [phone] Note:  [facility]  Patient seen and examined, agree with above note.  I dictated the care and orders written for this patient under my direction.  YaRush FarmerMDSaddle Rock Estates

## 2017-08-18 NOTE — Progress Notes (Signed)
Patient was placed back supine and was well tolerated.  Was cannulated by [facility] fellow with presence of a general surgeon.  Patient was also transfused.  PCCM assisted with pressor demand and stabilization of vital signs.  PCCM facilitated transport and discharge to [facility].  Family made aware.  The patient is critically ill with multiple organ systems failure and requires high complexity decision making for assessment and support, frequent evaluation and titration of therapies, application of advanced monitoring technologies and extensive interpretation of multiple databases.   Critical Care Time devoted to patient care services described in this note is  35  Minutes. This time reflects time of care of this signee Dr Jennet Maduro. This critical care time does not reflect procedure time, or teaching time or supervisory time of PA/NP/Med student/Med Resident etc but could involve care discussion time.  Rush Farmer, M.D. [facility] Pulmonary/Critical Care Medicine. Pager: [phone]. After hours pager: [phone].

## 2017-08-18 NOTE — Progress Notes (Addendum)
PULMONARY / CRITICAL CARE MEDICINE   Name: SOLIANA KITKO MRN: [id number] DOB: [birth year]    ADMISSION DATE:  08/06/2017 CONSULTATION DATE:  08/11/17  REFERRING MD:  Dr. Karleen Hampshire / [facility]   CHIEF COMPLAINT:  Acute Respiratory Failure   BRIEF SUMMARY: 47 y/o F, recently discharged after lower respiratory tract infection/asthma exacerbation, admitted 4/10 with concern for PNA, AF, dCHF and sub-optimal medical compliance.  She was intubated in the early am of 4/15 by anesthesia.  Flu A positive.  Progressive infiltrates / ARDS.  Paralytics initiated 4/17.     STUDIES:  ECHO 4/8 >> LVEF estimated at 60-65%, normal wall motion, LA mildly dilated   CULTURES: BCx2 4/10 >> negative RVP 4/10 >> Flu A positive  Legionella 4/15 >> negative Strep Antigen 4/15 >> negative BCx2 4/17 >> ng   ANTIBIOTICS: Azithromycin 4/14 >> Tamiflu 4/16 >>  Cefepime 4/17 >>  Vanco 4/17 >>   SIGNIFICANT EVENTS: 4/10  Admit  4/13  PCCM consulted  4/15  Pt intubated for increased work of breathing 4/16  Hypotension, aline placed, peripheral neo 4/17  ARDS protocol, paralytics, TLC / Aline  4/20 9 beat run of VT, ON 60%/+10 with episode of desatn  LINES/TUBES: ETT 4/15 >>  ALine 4/17 >> L [place] TLC 4/17 >>  SUBJECTIVE:   Proned yesterday. Remains prone this am.  Remains on 100%, peep 16. No sig improvement in PO2 since ABG yesterday.  Scr 0.94->2.24 with attempts at diuresis yesterday.  Remains net +12L  Nasal bleeding improved    VITAL SIGNS: BP (!) 121/46   Pulse (!) 113   Temp 98 F (36.7 C) (Axillary)   Resp (!) 30   Ht 5\' 6"  (1.676 m)   Wt (!) 160.2 kg (353 lb 2.8 oz)   LMP  (LMP Unknown)   SpO2 95%   BMI 57.00 kg/m   HEMODYNAMICS:  CVP:  [10 mmHg-29 mmHg] 16 mmHg  VENTILATOR SETTINGS: Vent Mode: PRVC FiO2 (%):  [90 %-100 %] 100 % Set Rate:  [30 bmp] 30 bmp Vt Set:  [480 mL] 480 mL PEEP:  [16 cmH20] 16 cmH20 Plateau Pressure:  [26 cmH20-39 cmH20] 37 cmH20  INTAKE / OUTPUT: I/O last 3  completed shifts: In: 5203.4 [I.V.:3213.4; NG/GT:930; IV Piggyback:1060] Out: 7342 [Urine:1262]  PHYSICAL EXAMINATION: General: critically ill female, obese, prone  HEENT:  ETT , facial edema, mm moist  Neuro: sedated / paralyzed, RASS -5   CV: s1s2 rrr, no m/r/g PULM: resps even non labored on vent, prone, diminished throughout, few scattered rhonchi  GI:  Soft, prone  Extremities: warm/dry, no overt edema  Skin: no rashes or lesions, L  TLC  LABS:  BMET Recent Labs  Lab 08/16/17 0524 08/17/17 0413 08/18/17 0500  NA 150* 155* 151*  K 3.8 3.0* 5.4*  CL 114* 124* 118*  CO2 28 26 25   BUN 52* 44* 77*  CREATININE 0.79 0.94 2.24*  GLUCOSE 188* 121* 192*    Electrolytes Recent Labs  Lab 08/16/17 0524 08/17/17 0413 08/18/17 0500  CALCIUM 7.8* 6.4* 7.7*  MG 2.4 1.8 2.2  PHOS 2.6 2.1* 4.3    CBC Recent Labs  Lab 08/16/17 0524 08/17/17 0413 08/18/17 0500  WBC 18.5* 12.4* 19.6*  HGB 8.3* 9.7* 7.7*  HCT 26.7* 33.5* 26.0*  PLT 135* 100* 137*    Coag's No results for input(s): APTT, INR in the last 168 hours.  Sepsis Markers No results for input(s): LATICACIDVEN, PROCALCITON, O2SATVEN in the last 168 hours.  ABG  Recent Labs  Lab 08/17/17 0320 08/17/17 1230 08/18/17 0403  PHART 7.271* 7.235* 7.230*  PCO2ART 69.1* 67.7* 60.3*  PO2ART 63.4* 69.5* 70.4*    Liver Enzymes No results for input(s): AST, ALT, ALKPHOS, BILITOT, ALBUMIN in the last 168 hours.  Cardiac Enzymes No results for input(s): TROPONINI, PROBNP in the last 168 hours.  Glucose Recent Labs  Lab 08/17/17 1157 08/17/17 1621 08/17/17 1922 08/17/17 2342 08/18/17 0422 08/18/17 0748  GLUCAP 168* 146* 114* 109* 156* 160*    Imaging Dg Chest Port 1 View  Result Date: 08/18/2017 CLINICAL DATA:  Hypoxia EXAM: PORTABLE CHEST 1 VIEW COMPARISON:  August 17, 2017 FINDINGS: Endotracheal tube tip is 8.1 cm above the carina with the endotracheal tube tip at the level of C7-T1. Central catheter  tip is at the junction of the left innominate vein and superior vena cava. Nasogastric tube tip and side port are below the diaphragm. No pneumothorax. There is widespread interstitial and alveolar opacity diffusely. There is cardiomegaly with pulmonary venous hypertension. There are pleural effusions bilaterally. No bone lesions. No adenopathy appreciable by radiography. IMPRESSION: Tube and catheter positions as described without pneumothorax. The endotracheal tube may need to be advanced 4-5 cm given its position at C7-T1. Widespread interstitial and alveolar opacity. The appearance raises question of ARDS. There may be underlying congestive heart failure and/or pneumonia. More than one of these entities may exist concurrently. There is stable cardiac prominence. Electronically Signed   By: Lowella Grip III M.D.   On: 08/18/2017 07:51     DISCUSSION: 47 y/o F with severe ARDS. Flu POS, LLL PNA +/- AECOPD. Course c/b AKI.   ASSESSMENT / PLAN:  PULMONARY A: ARDS Influenza A Pneumonitis Acute Exacerbation of COPD  Possible CAP  Tobacco Abuse  COPD-FEV1 57% 2018 OSA - mild, as of 2008 sleep study  P:   Remains prone - need to flip supine - not done overnight r/t initial difficult intubation  Likely needs ECMO - Dr. Elsworth Soho discussed with [facility] 4/21 Continue lung protective ventilation 8cc/kg, cannot lower due to high PCO2 Continue RR @ 30 continue PEEP 16  Continue paralytic  BDs  Tamiflu, abx as above  Will call [facility] and try to initiate tx for ECMO asap    CARDIOVASCULAR A:  Hypotension - ?related in part to high peep needs.  H/o Severe HTN - ? Related to hyperaldosteronism Diastolic CHF - grade 1 by most recent ECHO, but low BNP Atrial Fibrillation  NSVT 4/20 P:  apixaban held r/t nasal bleeding  Levophed as needed for MAP >65 due to sedation Holding home losartan, spironolactone, diltiazem   RENAL A:   AKI - improving, non oliguric Metab acidosis   -resolved Hyponatremia, induced by diuresis Hyperkalemia  P:   Hold further diuresis with worsening AKI  Continue D5W  IV insulin, D50 now for hyperkalemia  F/u chem at noon  Will add HCO3 gtt   GASTROINTESTINAL A:   GERD Morbid Obesity  P:   NPO / OGT Pepcid for SUP  D/c TF for now   HEMATOLOGIC A:   Influenza A Positive  Leukocytosis - ? Related to steroids, essentially negative PCT  P:  Trend CBC  Will add SQ heparin for DVT proph  Holding eliquis    INFECTIOUS A:   Possible CAP - ? LLL infiltrate, PCT negative, leukocytosis ? related to steroids  Influenza A  P:   Ct Tamiflu  Continue abx as above -- vanc, azithro, cefepime Monitor fever curve /  WBC trend  Trend pct   ENDOCRINE A:   DM with Steroid Induced Hyperglycemia    P:   SSI, resistant scale  Continue lantus 25  D/c TF coverage for now   NEUROLOGIC A:   Sedation needs on vent  Anxiety / Depression  P:   RASS goal: -5 with paralytics  Fentanyl + Versed for sedation  D/c seroquel for now with heavy sedation/paralytic  Home Zoloft/ buspar on hold   FAMILY  - Updates:   No family at bedside 4/22 - will contact them for update when we know the plan for possible tx to [facility] for ECMO   Nickolas Madrid, NP 08/18/2017  9:34 AM Pager: [phone] or [phone]  Attending Note:  47 year old female with asthma, smoker, influenza and ARDS.  Patient is currently proned but not oxygenating well, deteriorating.  On exam, diffuse crackles noted.  I reviewed CXR myself, infiltrate noted and ETT is in good position.  Patient is clearly making little progress.  Spoke with [facility], they are sending fellow over for decannulation for ECMO.  CCS Dr. Hassell Done will provide back up for the fellow while they cannulate patient.  Will not unprone patient until [facility] fellow is here.  Per our conversation will be here around 1 PM for cannulation.  Will maintain dry as able in the meantime and continue full support for  now.  The patient is critically ill with multiple organ systems failure and requires high complexity decision making for assessment and support, frequent evaluation and titration of therapies, application of advanced monitoring technologies and extensive interpretation of multiple databases.   Critical Care Time devoted to patient care services described in this note is  120  Minutes. This time reflects time of care of this signee Dr Jennet Maduro. This critical care time does not reflect procedure time, or teaching time or supervisory time of PA/NP/Med student/Med Resident etc but could involve care discussion time.  Rush Farmer, M.D. [facility] Pulmonary/Critical Care Medicine. Pager: [phone]. After hours pager: [phone].

## 2017-08-18 NOTE — Progress Notes (Signed)
Nutrition Follow-up  DOCUMENTATION CODES:   Morbid obesity  INTERVENTION:  - Continue to hold TF at this time.  - Free water, if desired, to be per CCM given hypernatremia, previous need for Lasix, current IVF.   NUTRITION DIAGNOSIS:   Inadequate oral intake related to inability to eat as evidenced by NPO status. -ongoing  GOAL:   Provide needs based on ASPEN/SCCM guidelines -previously met, currently unmet with TF on hold   MONITOR:   Vent status, TF tolerance, Weight trends, Labs  ASSESSMENT:   47 yr old female with AECOPD and acute hypercapnic respiratory failure failed NIPPV and diuresis. Required intubation.   Pt remains intubated, sedated, chemically paralyzed, and with OGT in place. Pt placed in in prone position yesterday and remains in prone positioning at this time. Spoke with RN at bedside. No plan to turn pt as plan is for ECMO team from [facility] to cannulate pt and then for her to transfer to [facility] for ECMO. TF held since pt placed in prone position.   Per PCCM NP note this AM: plan to try initiating transfer to [facility] as soon as possible, holding Lasix/diuresis d/t worsening AKI, plan to add sodium bicarb IVF, plan to hold TF for now, continue Tamiflue for influenza A positive.  Patient is currently intubated on ventilator support MV: 14.2 L/min Temp (24hrs), Avg:98.7 F (37.1 C), Min:97.3 F (36.3 C), Max:99.4 F (37.4 C) Propofol: none  Medications reviewed; 50 mL D50 x1 this AM, 20 mg IV Pepcid BID, sliding scale Novolog, 10 units Novolog x1 dose this AM, 25 units Lantus/day, 10 mEq IV KCl x6 runs yesterday Labs reviewed; CBGs: 156 and 160 mg/dL this AM, Na: 151 mmol/L, K: 5.4 mmol/L, Cl: 118 mmol/L, BUN: 77 mg/dL, creatinine: 2.24 mg/dL, Ca: 7.7 mg/dL, GFR: 30 mL/min.  IVF: 150 mEq sodium bicarb in sterile water @ 125 mL/hr started at 9:15 AM; previously receiving D5 @ 50 mL/hr (204 kcal). Drips: Nimbex @ 3 mcg/kg/min, Levo @ 6 mcg/min, Fentanyl @ 175 mcg/hr,  Versed @ 4 mg/hr.     Diet Order:  Diet NPO time specified  EDUCATION NEEDS:   No education needs have been identified at this time  Skin:  Skin Assessment: Reviewed RN Assessment  Last BM:  4/17  Height:   Ht Readings from Last 1 Encounters:  08/12/17 5' 6" (1.676 m)    Weight:   Wt Readings from Last 1 Encounters:  08/18/17 (!) 353 lb 2.8 oz (160.2 kg)    Ideal Body Weight:  59.09 kg  BMI:  Body mass index is 57 kg/m.  Estimated Nutritional Needs:   Kcal:  1300-1477 (22-25 kcal/kg IBW)  Protein:  >/= 148 grams (2.5 grams/kg IBW)  Fluid:  >/= 1.6 L/day       , MS, RD, LDN, CNSC Inpatient Clinical Dietitian Pager # [phone] After hours/weekend pager # [phone]  

## 2017-08-19 LAB — TYPE AND SCREEN
ABO/RH(D): B POS
Antibody Screen: NEGATIVE
UNIT DIVISION: 0
UNIT DIVISION: 0
Unit division: 0
Unit division: 0
Unit division: 0

## 2017-08-19 LAB — BPAM FFP
BLOOD PRODUCT EXPIRATION DATE: 201904272359
Blood Product Expiration Date: 201904272359
ISSUE DATE / TIME: 201904221254
ISSUE DATE / TIME: 201904221254
UNIT TYPE AND RH: 7300
Unit Type and Rh: 7300

## 2017-08-19 LAB — BPAM RBC
BLOOD PRODUCT EXPIRATION DATE: 201905142359
BLOOD PRODUCT EXPIRATION DATE: 201905152359
Blood Product Expiration Date: 201905142359
Blood Product Expiration Date: 201905142359
Blood Product Expiration Date: 201905152359
ISSUE DATE / TIME: 201904221156
ISSUE DATE / TIME: 201904221257
ISSUE DATE / TIME: 201904221257
ISSUE DATE / TIME: 201904221257
ISSUE DATE / TIME: 201904221257
UNIT TYPE AND RH: 7300
UNIT TYPE AND RH: 7300
Unit Type and Rh: 7300
Unit Type and Rh: 7300
Unit Type and Rh: 7300

## 2017-08-19 LAB — PREPARE FRESH FROZEN PLASMA
UNIT DIVISION: 0
Unit division: 0

## 2017-09-01 DIAGNOSIS — I10 Essential (primary) hypertension: Secondary | ICD-10-CM

## 2017-09-04 ENCOUNTER — Ambulatory Visit: Payer: Medicaid Other | Admitting: Internal Medicine

## 2017-09-22 DIAGNOSIS — R5381 Other malaise: Secondary | ICD-10-CM | POA: Insufficient documentation

## 2017-10-06 ENCOUNTER — Telehealth: Payer: Self-pay

## 2017-10-06 NOTE — Telephone Encounter (Signed)
Call received from Marcelino Scot, [place] who stated that she spoke to the patient today. She has been discharged from [facility] who she recently completed physical rehab.  Dorothy plans to make a home visit tomorrow and review discharge instructions/DME orders and status of PCS services. Patient has an appointment at [facility] on 10/08/17 with Dr Joya Gaskins.

## 2017-10-07 ENCOUNTER — Other Ambulatory Visit: Payer: Self-pay

## 2017-10-07 ENCOUNTER — Telehealth: Payer: Self-pay

## 2017-10-07 NOTE — Telephone Encounter (Signed)
Call received from Marcelino Scot, [place].  She explained that she made a home visit today. The patient was recently discharged from [facility] but  Earlie Server did not have a discharge summary.  She did review the patients medications/orders. Home health - RN/PT/OT/SW/HHA were ordered through [facility] but the services have not yet started. The patient had been receiving 80 hours/month of PCS but Earlie Server was not sure if they are being re-instated or a new referral needs to be placed.  This CM to check the status of PCS with [employer]. She is currently living with her mother in a second floor apartment - 15 steps to the second floor. She received the following DME from [facility]: hospital bed, wheelchair and walker. The patient has instructions to obtain/log daily weights but does not have a scale.  She currently has medicaid and has a disability application pending.   She has an appointment with Dr Joya Gaskins tomorrow - 10/08/17 and her sister is providing transportation to the clinic Earlie Server noted that the patient will have difficulty negotiating the stairs in the apartment building   Earlie Server is very concerned about the patient's ability to care for herself and noted that the patient is considering assisted living at this time.  She understands that the options with medicaid are extremely limited but this CM will discuss with the patient tomorrow at her appointment. Earlie Server is requesting that a PHQ-9 be completed by the patient and also noted that tele-monitoring can be ordered if appropriate.

## 2017-10-08 ENCOUNTER — Inpatient Hospital Stay (HOSPITAL_COMMUNITY)
Admission: EM | Admit: 2017-10-08 | Discharge: 2017-10-15 | DRG: 204 | Disposition: A | Discharge status: Skilled Nursing Facility | Payer: Medicaid Other | Source: Ambulatory Visit | Attending: Internal Medicine | Admitting: Internal Medicine

## 2017-10-08 ENCOUNTER — Encounter (HOSPITAL_COMMUNITY): Payer: Self-pay | Admitting: Emergency Medicine

## 2017-10-08 ENCOUNTER — Emergency Department (HOSPITAL_COMMUNITY): Payer: Medicaid Other

## 2017-10-08 ENCOUNTER — Encounter: Payer: Self-pay | Admitting: Critical Care Medicine

## 2017-10-08 ENCOUNTER — Other Ambulatory Visit: Payer: Self-pay

## 2017-10-08 ENCOUNTER — Ambulatory Visit (HOSPITAL_BASED_OUTPATIENT_CLINIC_OR_DEPARTMENT_OTHER): Payer: Medicaid Other | Admitting: Critical Care Medicine

## 2017-10-08 ENCOUNTER — Inpatient Hospital Stay (HOSPITAL_COMMUNITY): Payer: Medicaid Other

## 2017-10-08 VITALS — BP 102/69 | HR 82 | Temp 97.7°F | Resp 28 | Wt 303.0 lb

## 2017-10-08 DIAGNOSIS — Z7952 Long term (current) use of systemic steroids: Secondary | ICD-10-CM | POA: Insufficient documentation

## 2017-10-08 DIAGNOSIS — I4891 Unspecified atrial fibrillation: Secondary | ICD-10-CM

## 2017-10-08 DIAGNOSIS — I110 Hypertensive heart disease with heart failure: Secondary | ICD-10-CM | POA: Diagnosis present

## 2017-10-08 DIAGNOSIS — E876 Hypokalemia: Secondary | ICD-10-CM | POA: Diagnosis present

## 2017-10-08 DIAGNOSIS — F329 Major depressive disorder, single episode, unspecified: Secondary | ICD-10-CM | POA: Insufficient documentation

## 2017-10-08 DIAGNOSIS — Z792 Long term (current) use of antibiotics: Secondary | ICD-10-CM | POA: Insufficient documentation

## 2017-10-08 DIAGNOSIS — J4541 Moderate persistent asthma with (acute) exacerbation: Secondary | ICD-10-CM

## 2017-10-08 DIAGNOSIS — Z6841 Body Mass Index (BMI) 40.0 and over, adult: Secondary | ICD-10-CM

## 2017-10-08 DIAGNOSIS — R1115 Cyclical vomiting syndrome unrelated to migraine: Secondary | ICD-10-CM | POA: Insufficient documentation

## 2017-10-08 DIAGNOSIS — Z825 Family history of asthma and other chronic lower respiratory diseases: Secondary | ICD-10-CM | POA: Insufficient documentation

## 2017-10-08 DIAGNOSIS — Z91041 Radiographic dye allergy status: Secondary | ICD-10-CM

## 2017-10-08 DIAGNOSIS — J849 Interstitial pulmonary disease, unspecified: Secondary | ICD-10-CM | POA: Diagnosis present

## 2017-10-08 DIAGNOSIS — R059 Cough, unspecified: Secondary | ICD-10-CM

## 2017-10-08 DIAGNOSIS — R0609 Other forms of dyspnea: Secondary | ICD-10-CM | POA: Diagnosis not present

## 2017-10-08 DIAGNOSIS — Z79899 Other long term (current) drug therapy: Secondary | ICD-10-CM

## 2017-10-08 DIAGNOSIS — K219 Gastro-esophageal reflux disease without esophagitis: Secondary | ICD-10-CM

## 2017-10-08 DIAGNOSIS — G4733 Obstructive sleep apnea (adult) (pediatric): Secondary | ICD-10-CM

## 2017-10-08 DIAGNOSIS — J80 Acute respiratory distress syndrome: Secondary | ICD-10-CM | POA: Insufficient documentation

## 2017-10-08 DIAGNOSIS — Z8249 Family history of ischemic heart disease and other diseases of the circulatory system: Secondary | ICD-10-CM | POA: Insufficient documentation

## 2017-10-08 DIAGNOSIS — J441 Chronic obstructive pulmonary disease with (acute) exacerbation: Secondary | ICD-10-CM

## 2017-10-08 DIAGNOSIS — Z7901 Long term (current) use of anticoagulants: Secondary | ICD-10-CM | POA: Insufficient documentation

## 2017-10-08 DIAGNOSIS — R05 Cough: Secondary | ICD-10-CM

## 2017-10-08 DIAGNOSIS — R0602 Shortness of breath: Secondary | ICD-10-CM | POA: Diagnosis not present

## 2017-10-08 DIAGNOSIS — Z794 Long term (current) use of insulin: Secondary | ICD-10-CM

## 2017-10-08 DIAGNOSIS — E869 Volume depletion, unspecified: Secondary | ICD-10-CM | POA: Insufficient documentation

## 2017-10-08 DIAGNOSIS — I10 Essential (primary) hypertension: Secondary | ICD-10-CM | POA: Insufficient documentation

## 2017-10-08 DIAGNOSIS — Z87891 Personal history of nicotine dependence: Secondary | ICD-10-CM

## 2017-10-08 DIAGNOSIS — I5032 Chronic diastolic (congestive) heart failure: Secondary | ICD-10-CM | POA: Diagnosis present

## 2017-10-08 DIAGNOSIS — I480 Paroxysmal atrial fibrillation: Secondary | ICD-10-CM | POA: Diagnosis present

## 2017-10-08 DIAGNOSIS — Z9281 Personal history of extracorporeal membrane oxygenation (ECMO): Secondary | ICD-10-CM | POA: Diagnosis not present

## 2017-10-08 DIAGNOSIS — D72829 Elevated white blood cell count, unspecified: Secondary | ICD-10-CM

## 2017-10-08 DIAGNOSIS — Z809 Family history of malignant neoplasm, unspecified: Secondary | ICD-10-CM

## 2017-10-08 DIAGNOSIS — J449 Chronic obstructive pulmonary disease, unspecified: Secondary | ICD-10-CM | POA: Diagnosis present

## 2017-10-08 DIAGNOSIS — R7989 Other specified abnormal findings of blood chemistry: Secondary | ICD-10-CM | POA: Diagnosis present

## 2017-10-08 DIAGNOSIS — Z7951 Long term (current) use of inhaled steroids: Secondary | ICD-10-CM | POA: Diagnosis not present

## 2017-10-08 DIAGNOSIS — R079 Chest pain, unspecified: Secondary | ICD-10-CM | POA: Diagnosis present

## 2017-10-08 DIAGNOSIS — F419 Anxiety disorder, unspecified: Secondary | ICD-10-CM | POA: Diagnosis present

## 2017-10-08 DIAGNOSIS — R1110 Vomiting, unspecified: Secondary | ICD-10-CM | POA: Insufficient documentation

## 2017-10-08 DIAGNOSIS — R0781 Pleurodynia: Principal | ICD-10-CM | POA: Diagnosis present

## 2017-10-08 DIAGNOSIS — R0600 Dyspnea, unspecified: Secondary | ICD-10-CM

## 2017-10-08 DIAGNOSIS — R531 Weakness: Secondary | ICD-10-CM

## 2017-10-08 DIAGNOSIS — Z888 Allergy status to other drugs, medicaments and biological substances status: Secondary | ICD-10-CM | POA: Insufficient documentation

## 2017-10-08 DIAGNOSIS — R5381 Other malaise: Secondary | ICD-10-CM | POA: Diagnosis not present

## 2017-10-08 DIAGNOSIS — J019 Acute sinusitis, unspecified: Secondary | ICD-10-CM | POA: Diagnosis present

## 2017-10-08 LAB — BASIC METABOLIC PANEL
ANION GAP: 12 (ref 5–15)
BUN: 5 mg/dL — ABNORMAL LOW (ref 6–20)
CALCIUM: 8.3 mg/dL — AB (ref 8.9–10.3)
CHLORIDE: 102 mmol/L (ref 101–111)
CO2: 24 mmol/L (ref 22–32)
CREATININE: 0.93 mg/dL (ref 0.44–1.00)
GFR calc Af Amer: >60 mL/min (ref >60)
GFR calc non Af Amer: >60 mL/min (ref >60)
GLUCOSE: 94 mg/dL (ref 65–99)
Potassium: 2.7 mmol/L — CL (ref 3.5–5.1)
Sodium: 138 mmol/L (ref 135–145)

## 2017-10-08 LAB — CBC
HCT: 39.1 % (ref 36.0–46.0)
HEMOGLOBIN: 12.6 g/dL (ref 12.0–15.0)
MCH: 27.2 pg (ref 26.0–34.0)
MCHC: 32.2 g/dL (ref 30.0–36.0)
MCV: 84.3 fL (ref 78.0–100.0)
Platelets: 401 10*3/uL — ABNORMAL HIGH (ref 150–400)
RBC: 4.64 MIL/uL (ref 3.87–5.11)
RDW: 16.4 % — ABNORMAL HIGH (ref 11.5–15.5)
WBC: 15.8 10*3/uL — ABNORMAL HIGH (ref 4.0–10.5)

## 2017-10-08 LAB — I-STAT BETA HCG BLOOD, ED (MC, WL, AP ONLY): I-stat hCG, quantitative: <5 m[IU]/mL (ref <5)

## 2017-10-08 LAB — D-DIMER, QUANTITATIVE (NOT AT ARMC): D DIMER QUANT: 0.92 ug{FEU}/mL — AB (ref 0.00–0.50)

## 2017-10-08 LAB — I-STAT TROPONIN, ED: TROPONIN I, POC: 0 ng/mL (ref 0.00–0.08)

## 2017-10-08 LAB — HEPARIN LEVEL (UNFRACTIONATED): Heparin Unfractionated: 1.58 IU/mL — ABNORMAL HIGH (ref 0.30–0.70)

## 2017-10-08 LAB — APTT: aPTT: 36 seconds (ref 24–36)

## 2017-10-08 LAB — BRAIN NATRIURETIC PEPTIDE: B Natriuretic Peptide: 29.9 pg/mL (ref 0.0–100.0)

## 2017-10-08 LAB — I-STAT CG4 LACTIC ACID, ED: LACTIC ACID, VENOUS: 1.71 mmol/L (ref 0.5–1.9)

## 2017-10-08 LAB — MAGNESIUM: MAGNESIUM: 1.3 mg/dL — AB (ref 1.7–2.4)

## 2017-10-08 MED ORDER — DIPHENHYDRAMINE HCL 50 MG/ML IJ SOLN: 50.0000 mg | Freq: Once | INTRAMUSCULAR | Status: AC

## 2017-10-08 MED ORDER — HYDROCORTISONE NA SUCCINATE PF 250 MG IJ SOLR
200.0000 mg | Freq: Once | INTRAMUSCULAR | Status: AC
  Administered 2017-10-08: 200 mg via INTRAVENOUS
  Filled 2017-10-08: qty 200

## 2017-10-08 MED ORDER — POTASSIUM CHLORIDE CRYS ER 20 MEQ PO TBCR: 40.0000 meq | EXTENDED_RELEASE_TABLET | ORAL | Status: AC

## 2017-10-08 MED ORDER — POTASSIUM CHLORIDE CRYS ER 20 MEQ PO TBCR
40.0000 meq | EXTENDED_RELEASE_TABLET | Freq: Once | ORAL | Status: AC
  Administered 2017-10-08: 40 meq via ORAL
  Filled 2017-10-08: qty 2

## 2017-10-08 MED ORDER — MAGNESIUM SULFATE 4 GM/100ML IV SOLN
4.0000 g | Freq: Once | INTRAVENOUS | Status: AC
  Administered 2017-10-09: 4 g via INTRAVENOUS
  Filled 2017-10-08: qty 100

## 2017-10-08 MED ORDER — POTASSIUM CHLORIDE IN NACL 20-0.9 MEQ/L-% IV SOLN
INTRAVENOUS | Status: DC
  Administered 2017-10-08: 23:00:00 via INTRAVENOUS
  Filled 2017-10-08 (×2): qty 1000

## 2017-10-08 MED ORDER — MAGNESIUM SULFATE 2 GM/50ML IV SOLN
2.0000 g | Freq: Once | INTRAVENOUS | Status: AC
  Administered 2017-10-08: 2 g via INTRAVENOUS
  Filled 2017-10-08: qty 50

## 2017-10-08 MED ORDER — IOPAMIDOL (ISOVUE-370) INJECTION 76%
100.0000 mL | Freq: Once | INTRAVENOUS | Status: AC | PRN
  Administered 2017-10-08: 80 mL via INTRAVENOUS

## 2017-10-08 MED ORDER — IOPAMIDOL (ISOVUE-370) INJECTION 76%
INTRAVENOUS | Status: AC
  Filled 2017-10-08: qty 100

## 2017-10-08 MED ORDER — HEPARIN (PORCINE) IN NACL 100-0.45 UNIT/ML-% IJ SOLN
1000.0000 [IU]/h | INTRAMUSCULAR | Status: DC
  Administered 2017-10-08: 1000 [IU]/h via INTRAVENOUS
  Filled 2017-10-08: qty 250

## 2017-10-08 MED ORDER — DIPHENHYDRAMINE HCL 25 MG PO CAPS
50.0000 mg | ORAL_CAPSULE | Freq: Once | ORAL | Status: AC
  Administered 2017-10-08: 50 mg via ORAL
  Filled 2017-10-08: qty 2

## 2017-10-08 MED ORDER — IPRATROPIUM-ALBUTEROL 0.5-2.5 (3) MG/3ML IN SOLN
5.0000 mL | Freq: Once | RESPIRATORY_TRACT | Status: AC
  Administered 2017-10-08: 5 mL via RESPIRATORY_TRACT
  Filled 2017-10-08: qty 3

## 2017-10-08 MED ORDER — HEPARIN (PORCINE) IN NACL 100-0.45 UNIT/ML-% IJ SOLN
1500.0000 [IU]/h | INTRAMUSCULAR | Status: DC
  Administered 2017-10-08: 1500 [IU]/h via INTRAVENOUS
  Filled 2017-10-08: qty 250

## 2017-10-08 MED ORDER — LACTATED RINGERS IV BOLUS
1000.0000 mL | Freq: Once | INTRAVENOUS | Status: AC
  Administered 2017-10-08: 1000 mL via INTRAVENOUS

## 2017-10-08 MED ORDER — DIPHENHYDRAMINE HCL 50 MG/ML IJ SOLN
50.0000 mg | Freq: Once | INTRAMUSCULAR | Status: AC
  Administered 2017-10-08: 50 mg via INTRAVENOUS
  Filled 2017-10-08: qty 1

## 2017-10-08 NOTE — Progress Notes (Signed)
Hospital follow up?

## 2017-10-08 NOTE — ED Notes (Signed)
Bed: WA17 Expected date:  Expected time:  Means of arrival:  Comments: EMS chest pain

## 2017-10-08 NOTE — ED Notes (Signed)
ED TO INPATIENT HANDOFF REPORT  Name/Age/Gender April [name] 47 y.o. female  Code Status    Code Status Orders  (From admission, onward)        Start     Ordered   10/08/17 1638  Full code  Continuous     10/08/17 1637    Code Status History    Date Active Date Inactive Code Status Order ID Comments User Context   08/07/2017 0405 08/18/2017 2018 Full Code [id number]  Merton Border, MD ED   07/29/2017 0627 08/06/2017 1524 Full Code [id number]  [name], MD ED   07/07/2017 0717 07/12/2017 2008 Full Code [id number]  Rondel Jumbo, PA-C ED   06/06/2017 0836 06/08/2017 2107 Full Code [id number]  [name], MD ED   05/26/2017 0617 06/01/2017 1653 Full Code [id number]  [name], MD ED   04/27/2017 1623 04/30/2017 1742 Full Code [id number]  Rondel Jumbo, PA-C ED   02/15/2017 0226 02/16/2017 1806 Full Code [id number]  [name], MD Inpatient   02/11/2017 0038 02/14/2017 0537 Full Code [id number]  [name], MD ED   01/27/2017 1024 02/01/2017 1751 Full Code [id number]  Jani Gravel, MD ED   01/17/2017 0514 01/17/2017 2021 Full Code [id number]  [name], MD ED   01/01/2017 0818 01/02/2017 1920 Full Code [id number]  [name], NP ED   12/21/2016 2015 12/22/2016 1723 Full Code [id number]  [name], MD ED   12/10/2016 1852 12/13/2016 1229 Full Code [id number]  [name], MD ED   11/13/2016 2017 11/15/2016 1758 Full Code [id number]  [name], MD ED   10/19/2016 0204 10/20/2016 1755 Full Code [id number]  [name], MD ED   10/02/2016 0553 10/04/2016 2204 Full Code [id number]  [name], MD Inpatient   06/22/2016 2104 06/25/2016 1707 Full Code [id number]  [name], DO ED   05/05/2016 2044 05/07/2016 1601 Full Code [id number]  [name], MD Inpatient   01/30/2016 1937 01/31/2016 1801 Full Code [id number]  [name], MD Inpatient   12/19/2015 0640 12/21/2015 1851 Full Code [id number]  [name], MD Inpatient   11/27/2015 0019  11/27/2015 2031 Full Code [id number]  [name], MD ED   11/10/2015 1408 11/12/2015 1641 Full Code [id number]  [name], MD ED   10/24/2015 0933 10/26/2015 1626 Full Code [id number]  [name], MD ED   08/22/2015 2052 08/24/2015 1700 Full Code [id number]  [name], MD ED   08/14/2015 1624 08/15/2015 1644 Full Code [id number]  [name], MD ED   07/12/2015 1429 07/13/2015 0015 Full Code [id number]  [name], MD Inpatient   05/23/2015 1650 05/24/2015 1819 Full Code [id number]  [name], MD ED   03/13/2015 1143 03/14/2015 1742 Full Code [id number]  [name], MD Inpatient   11/03/2014 0444 11/03/2014 1823 Full Code [id number]  [name], MD Inpatient   09/15/2014 1326 09/16/2014 1658 Full Code [id number]  [name] Inpatient   09/08/2014 1401 09/09/2014 1752 Full Code [id number]  [name], MD Inpatient   03/27/2014 2111 03/28/2014 1754 Full Code [id number]  [name], MD Inpatient   11/23/2013 1717 11/24/2013 0009 Full Code [id number]  Clinton Gallant, MD Inpatient   06/06/2013 2326 06/07/2013 1912 Full Code [id number]  [name], MD Inpatient   11/02/2012 1854 11/03/2012 1909 Full Code [id number]  [name], MD Inpatient   10/21/2012 1030 10/23/2012 1730 Full Code [id number]  [name],  MD Inpatient   10/06/2012 2349 10/07/2012 0657 Full Code [id number]  [name], MD Inpatient   05/22/2012 2200 05/23/2012 1627 Full Code [id number]  [name], RN Inpatient   02/27/2012 0219 02/27/2012 1728 Full Code [id number]  [name], MD Inpatient      Home/SNF/Other Home  Chief Complaint chest pain   Level of Care/Admitting Diagnosis ED Disposition    ED Disposition Condition Comment   [facility]: [facility] [[id number]]  Level of Care: Stepdown [14]  Admit to SDU based on following criteria: Respiratory Distress:  Frequent assessment and/or intervention to maintain adequate ventilation/respiration,  pulmonary toilet, and respiratory treatment.  Diagnosis: SOB (shortness of breath) [637858]  Admitting Physician: [name] [[id number]]  Attending Physician: [name] [[id number]]  Estimated length of stay: past midnight tomorrow  Certification:: I certify this patient will need inpatient services for at least 2 midnights  PT Class (Do Not Modify): Inpatient [101]  PT Acc Code (Do Not Modify): Private [1]       Medical History Past Medical History:  Diagnosis Date  . Acanthosis nigricans   . Anxiety   . Arthritis    "knees" (04/28/2017)  . Asthma    Followed by Dr. Annamaria Boots (pulmonology); receives every other week omalizumab injections; has frequent exacerbations  . COPD (chronic obstructive pulmonary disease) ([place])    PFTs in 2002, FEV1/FVC 65, no post bronchodilater test done  . Depression   . GERD (gastroesophageal reflux disease)   . Headache(784.0)    "q couple days" (04/28/2017)  . Helicobacter pylori (H. pylori) infection   . Hypertension, essential   . Insomnia   . Menorrhagia   . Morbid obesity ([place])   . OSA on CPAP    Sleep study 2008 - mild OSA, not enough events to titrate CPAP; wears CPAP now/pt on 04/28/2017  . Pneumonia X 1  . Seasonal allergies   . Shortness of breath   . Tobacco user     Allergies Allergies  Allergen Reactions  . Contrast Media [Iodinated Diagnostic Agents] Itching    Ct contrast  . Oxycodone-Acetaminophen Itching    IV Location/Drains/Wounds Patient Lines/Drains/Airways Status   Active Line/Drains/Airways    Name:   Placement date:   Placement time:   Site:   Days:   Peripheral IV 10/08/17 Right Antecubital   10/08/17    1245    Antecubital   less than 1   Peripheral IV 10/08/17 Right Wrist   10/08/17    1527    Wrist   less than 1          Labs/Imaging Results for orders placed or performed during the hospital encounter of 10/08/17 (from the past 48 hour(s))  Basic metabolic panel     Status: Abnormal    Collection Time: 10/08/17 12:44 PM  Result Value Ref Range   Sodium 138 135 - 145 mmol/L   Potassium 2.7 (LL) 3.5 - 5.1 mmol/L    Comment: CRITICAL RESULT CALLED TO, READ BACK BY AND VERIFIED WITH: BARHAM,R. RN AT 1345 10/08/17 MULLINS,T    Chloride 102 101 - 111 mmol/L   CO2 24 22 - 32 mmol/L   Glucose, Bld 94 65 - 99 mg/dL   BUN 5 (L) 6 - 20 mg/dL   Creatinine, Ser 0.93 0.44 - 1.00 mg/dL   Calcium 8.3 (L) 8.9 - 10.3 mg/dL   GFR calc non Af Amer >60 >60 mL/min   GFR calc Af Amer >60 >  60 mL/min    Comment: (NOTE) The eGFR has been calculated using the CKD EPI equation. This calculation has not been validated in all clinical situations. eGFR's persistently <60 mL/min signify possible Chronic Kidney Disease.    Anion gap 12 5 - 15    Comment: Performed at [facility], [place] [address], [place], [place] [address]  CBC     Status: Abnormal   Collection Time: 10/08/17 12:44 PM  Result Value Ref Range   WBC 15.8 (H) 4.0 - 10.5 K/uL   RBC 4.64 3.87 - 5.11 MIL/uL   Hemoglobin 12.6 12.0 - 15.0 g/dL   HCT 39.1 36.0 - 46.0 %   MCV 84.3 78.0 - 100.0 fL   MCH 27.2 26.0 - 34.0 pg   MCHC 32.2 30.0 - 36.0 g/dL   RDW 16.4 (H) 11.5 - 15.5 %   Platelets 401 (H) 150 - 400 K/uL    Comment: Performed at [facility], [place] [address] [place], [place] [address]  Brain natriuretic peptide     Status: None   Collection Time: 10/08/17 12:44 PM  Result Value Ref Range   B Natriuretic Peptide 29.9 0.0 - 100.0 pg/mL    Comment: Performed at [facility], [place] [address], [place], [place] [address]  Magnesium     Status: Abnormal   Collection Time: 10/08/17 12:44 PM  Result Value Ref Range   Magnesium 1.3 (L) 1.7 - 2.4 mg/dL    Comment: Performed at [facility], [place] [address] [place], [place] [address]  D-dimer, quantitative (not at [facility])     Status: Abnormal   Collection Time: 10/08/17 12:44 PM  Result Value Ref Range    D-Dimer, Quant 0.92 (H) 0.00 - 0.50 ug/mL-FEU    Comment: (NOTE) At the manufacturer cut-off of 0.50 ug/mL FEU, this assay has been documented to exclude PE with a sensitivity and negative predictive value of 97 to 99%.  At this time, this assay has not been approved by the FDA to exclude DVT/VTE. Results should be correlated with clinical presentation. Performed at [facility], [place] [address], [place], [place] [address]   I-stat troponin, ED     Status: None   Collection Time: 10/08/17 12:48 PM  Result Value Ref Range   Troponin i, poc 0.00 0.00 - 0.08 ng/mL   Comment 3            Comment: Due to the release kinetics of cTnI, a negative result within the first hours of the onset of symptoms does not rule out myocardial infarction with certainty. If myocardial infarction is still suspected, repeat the test at appropriate intervals.   I-Stat CG4 Lactic Acid, ED     Status: None   Collection Time: 10/08/17 12:50 PM  Result Value Ref Range   Lactic Acid, Venous 1.71 0.5 - 1.9 mmol/L  I-Stat beta hCG blood, ED     Status: None   Collection Time: 10/08/17 12:52 PM  Result Value Ref Range   I-stat hCG, quantitative <5.0 <5 mIU/mL   Comment 3            Comment:   GEST. AGE      CONC.  (mIU/mL)   <=1 WEEK        5 - 50     2 WEEKS       50 - 500     3 WEEKS       100 - 10,000  4 WEEKS     1,000 - 30,000        FEMALE AND NON-PREGNANT FEMALE:     LESS THAN 5 mIU/mL   APTT     Status: None   Collection Time: 10/08/17  5:40 PM  Result Value Ref Range   aPTT 36 24 - 36 seconds    Comment: Performed at [facility], [place] [address], [place], [place] [address]  Heparin level (unfractionated)     Status: Abnormal   Collection Time: 10/08/17  5:40 PM  Result Value Ref Range   Heparin Unfractionated 1.58 (H) 0.30 - 0.70 IU/mL    Comment: RESULTS CONFIRMED BY MANUAL DILUTION Performed at [place] [address]  [place], [place] [address]    *Note: Due to a large number of results and/or encounters for the requested time period, some results have not been displayed. A complete set of results can be found in Results Review.   Dg Chest 2 View  Result Date: 10/08/2017 CLINICAL DATA:  Chest pain with cough and vomiting. EXAM: CHEST - 2 VIEW COMPARISON:  08/18/2017. FINDINGS: Cardiomegaly. Retrocardiac density, noted on the April radiograph, appears to persist although is less prominent. The diffuse airspace opacity noted previously is significantly improved. No bony abnormality. IMPRESSION: Cardiomegaly with overall improving aeration. There could be residual LEFT lower lobe infiltrate. Electronically Signed   By: Staci Righter M.D.   On: 10/08/2017 13:08   Ct Chest Wo Contrast  Result Date: 10/08/2017 CLINICAL DATA:  Worsening chest pain for 2 months EXAM: CT CHEST WITHOUT CONTRAST TECHNIQUE: Multidetector CT imaging of the chest was performed following the standard protocol without IV contrast. COMPARISON:  None. FINDINGS: Cardiovascular: No significant vascular findings. Normal heart size. No pericardial effusion. Mediastinum/Nodes: No enlarged mediastinal or axillary lymph nodes. Thyroid gland, trachea, and esophagus demonstrate no significant findings. Lungs/Pleura: No focal consolidation. Chronic bilateral upper lobe scarring with mild bronchiectasis. Mild lingular, right middle lobe and right lower lobe scarring. No pleural effusion or pneumothorax. Upper Abdomen: No acute upper abdominal abnormality. Musculoskeletal: No acute osseous abnormality. No aggressive osseous lesion. IMPRESSION: 1. No acute cardiopulmonary disease. 2. Mild bilateral chronic interstitial lung disease. Electronically Signed   By: Kathreen Devoid   On: 10/08/2017 14:44    Pending Labs Unresulted Labs (From admission, onward)   Start     Ordered   10/09/17 0500  CBC  Daily,   R     10/08/17 1640   10/09/17 9678  Basic metabolic panel   Daily,   R     10/08/17 1640   10/09/17 0100  APTT  Once,   R     10/08/17 1958      Vitals/Pain Today's Vitals   10/08/17 1830 10/08/17 1912 10/08/17 1930 10/08/17 2000  BP: (!) 151/84 129/80 138/75 127/63  Pulse: 84 80 76 82  Resp: (!) _0 Temp:      TempSrc:      SpO2: 100% 100% 100% 100%  PainSc:        Isolation Precautions No active isolations  Medications Medications  0.9 % NaCl with KCl 20 mEq/ L  infusion (has no administration in time range)  magnesium sulfate IVPB 4 g 100 mL (has no administration in time range)  potassium chloride SA (K-DUR,KLOR-CON) CR tablet 40 mEq (40 mEq Oral Not Given 10/08/17 2011)  heparin ADULT infusion 100 units/mL (25000 units/259m sodium chloride 0.45%) (1,500 Units/hr Intravenous New Bag/Given 10/08/17 2015)  diphenhydrAMINE (BENADRYL) injection 50 mg (has no administration in time range)  lactated ringers bolus 1,000 mL (0 mLs Intravenous Stopped 10/08/17 1911)  ipratropium-albuterol (DUONEB) 0.5-2.5 (3) MG/3ML nebulizer solution 5 mL (5 mLs Nebulization Given 10/08/17 1305)  potassium chloride SA (K-DUR,KLOR-CON) CR tablet 40 mEq (40 mEq Oral Given 10/08/17 1407)  magnesium sulfate IVPB 2 g 50 mL (0 g Intravenous Stopped 10/08/17 1747)  potassium chloride SA (K-DUR,KLOR-CON) CR tablet 40 mEq (40 mEq Oral Given 10/08/17 1515)  hydrocortisone sodium succinate (SOLU-CORTEF) injection 200 mg (200 mg Intravenous Given 10/08/17 1720)  diphenhydrAMINE (BENADRYL) capsule 50 mg (50 mg Oral Given 10/08/17 1717)    Or  diphenhydrAMINE (BENADRYL) injection 50 mg ( Intravenous See Alternative 10/08/17 1717)    Mobility walks with device

## 2017-10-08 NOTE — ED Notes (Signed)
ED Provider at bedside. 

## 2017-10-08 NOTE — ED Notes (Signed)
Patient being transported to radiology.

## 2017-10-08 NOTE — Progress Notes (Signed)
Patient chart reviewed, admitting provider contacted at [phone].  V.O received to admit pt to step down. Order updated in epic to reflect appropriate level of care.

## 2017-10-08 NOTE — Assessment & Plan Note (Signed)
Moderate persistent asthma with re exacerbation

## 2017-10-08 NOTE — Assessment & Plan Note (Signed)
Hx of ARDS  resolved

## 2017-10-08 NOTE — H&P (Signed)
History and Physical    April [name] NLG:[id number] DOB: [birth year] DOA: 10/08/2017  PCP: [name], MD Patient coming from: Home patient lives at home with her mother.  Chief Complaint: Generalized weakness and shortness of breath  HPI: April [name] is a 47 y.o. female with medical history significant of recent complicated hospital admission with ARDS, influenza, healthcare associated pneumonia requiring intubation tracheostomy at [facility].  Patient was in the [facility] for 2 months discharged to a rehab facility where she stayed for 2 weeks and then she was discharged home.  Even though she was at the rehab facility she never started walking with a walker.  Currently her main complaints are dyspnea on exertion, shortness of breath, and pleuritic chest pain.  Patient has history of atrial fibrillation and she takes Eliquis for over 3 to 4 years.  She reports that she has not missed any doses.  She also carries a history of congestive heart failure.  She went to her primary care physician today found to have a low blood pressure and she was told to come to the ER.  She is complaining of having cough bringing up yellow phlegm and chills no documented fever at home.  She also complains of having nausea and vomiting for 3 days with no diarrhea, no hematemesis no melena no hematuria or other urinary complaints.  Patient reports her mother has DVT and PE.   ED Course: Patient had a CT of the chest without contrast which showed no evidence of pneumonia BNP was normal a d-dimer was added onto the labs and came back high at 0.92 chest x-ray was questionable for pneumonia .  Signs were 05/11/1965 pulse rate 81 respiration anywhere from 18-29.  Labs significant for sodium 138 potassium 2.7 BUN 5 creatinine 0.93 magnesium 1.3 BNP was 29.9 lactic acid level was 1.71 white count was 15.8 hemoglobin 12.6 platelet count 401, d-dimer 0.92 the chest showed no acute cardiopulmonary disease, mild bilateral chronic  interstitial lung disease.  Review of Systems: As per HPI otherwise all other systems reviewed and are negative  Ambulatory Status:  Past Medical History:  Diagnosis Date  . Acanthosis nigricans   . Anxiety   . Arthritis    "knees" (04/28/2017)  . Asthma    Followed by Dr. Annamaria Boots (pulmonology); receives every other week omalizumab injections; has frequent exacerbations  . COPD (chronic obstructive pulmonary disease) ([place])    PFTs in 2002, FEV1/FVC 65, no post bronchodilater test done  . Depression   . GERD (gastroesophageal reflux disease)   . Headache(784.0)    "q couple days" (04/28/2017)  . Helicobacter pylori (H. pylori) infection   . Hypertension, essential   . Insomnia   . Menorrhagia   . Morbid obesity ([place])   . OSA on CPAP    Sleep study 2008 - mild OSA, not enough events to titrate CPAP; wears CPAP now/pt on 04/28/2017  . Pneumonia X 1  . Seasonal allergies   . Shortness of breath   . Tobacco user     Past Surgical History:  Procedure Laterality Date  . CARDIOVERSION N/A 05/30/2017   Procedure: CARDIOVERSION;  Surgeon: Sanda Klein, MD;  Location: [facility];  Service: Cardiovascular;  Laterality: N/A;  . REDUCTION MAMMAPLASTY Bilateral 09/2011  . TUBAL LIGATION  1996   bilateral    Social History   Socioeconomic History  . Marital status: Single    Spouse name: Not on file  . Number of children: 1  . Years of  education: Not on file  . Highest education level: Not on file  Occupational History  . Occupation: CNA  Social Needs  . Financial resource strain: Not on file  . Food insecurity:    Worry: Not on file    Inability: Not on file  . Transportation needs:    Medical: Not on file    Non-medical: Not on file  Tobacco Use  . Smoking status: Former Smoker    Packs/day: 0.50    Years: 26.00    Pack years: 13.00    Types: Cigarettes    Last attempt to quit: 09/12/2014    Years since quitting: 3.0  . Smokeless tobacco: Never Used  Substance  and Sexual Activity  . Alcohol use: No  . Drug use: No  . Sexual activity: Never  Lifestyle  . Physical activity:    Days per week: Not on file    Minutes per session: Not on file  . Stress: Not on file  Relationships  . Social connections:    Talks on phone: Not on file    Gets together: Not on file    Attends religious service: Not on file    Active member of club or organization: Not on file    Attends meetings of clubs or organizations: Not on file    Relationship status: Not on file  . Intimate partner violence:    Fear of current or ex partner: Not on file    Emotionally abused: Not on file    Physically abused: Not on file    Forced sexual activity: Not on file  Other Topics Concern  . 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    Allergies  Allergen Reactions  . Contrast Media [Iodinated Diagnostic Agents] Itching    Ct contrast  . Oxycodone-Acetaminophen Itching    Family History  Problem Relation Age of Onset  . Hypertension Mother   . Asthma Daughter   . Cancer Paternal Aunt   . Asthma Maternal Grandmother       Prior to Admission medications   Medication Sig Start Date End Date Taking? Authorizing Provider  albuterol (PROVENTIL) (2.5 MG/3ML) 0.083% nebulizer solution Take 3 mLs (2.5 mg total) by nebulization every 2 (two) hours as needed for wheezing or shortness of breath. 08/18/17  Yes Whiteheart, Cristal Ford, NP  amLODipine (NORVASC) 5 MG tablet Take 5 mg by mouth daily. 10/02/17 10/02/18 Yes [provider]  apixaban (ELIQUIS) 5 MG TABS tablet Take 5 mg by mouth 2 (two) times daily. 10/02/17  Yes [provider]  budesonide-formoterol (SYMBICORT) 160-4.5 MCG/ACT inhaler Inhale 2 puffs into the lungs 2 (two) times daily. 10/02/17 10/02/18 Yes [provider]  diclofenac sodium (VOLTAREN) 1 % GEL Apply 1 application topically 4 (four) times daily. 10/02/17 10/02/18 Yes [provider]    furosemide (LASIX) 40 MG tablet Take 40 mg by mouth daily. 10/02/17 10/02/18 Yes [provider]  guaiFENesin (MUCINEX) 600 MG 12 hr tablet Take 600 mg by mouth 3 (three) times daily as needed for cough.  10/02/17 10/12/17 Yes [provider]  ipratropium (ATROVENT HFA) 17 MCG/ACT inhaler Inhale 2 puffs into the lungs 4 (four) times daily as needed. 10/02/17 10/02/18 Yes [provider]  ipratropium-albuterol (DUONEB) 0.5-2.5 (3) MG/3ML SOLN Take 3 mLs by nebulization every 4 (four) hours. 08/18/17  Yes Whiteheart, Cristal Ford, NP  magnesium oxide (MAG-OX) 400 MG tablet Take 2 tablets by  mouth 3 (three) times daily. 10/02/17 10/02/18 Yes [provider]  montelukast (SINGULAIR) 10 MG tablet Take 10 mg by mouth at bedtime. 10/02/17  Yes [provider]  pantoprazole (PROTONIX) 40 MG tablet Take 40 mg by mouth daily. 10/02/17 10/02/18 Yes [provider]  Potassium Chloride ER 20 MEQ TBCR Take 20 mEq by mouth daily. 10/02/17  Yes [provider]  PROAIR HFA 108 (90 Base) MCG/ACT inhaler Take 2 puffs by mouth every 6 (six) hours as needed. 10/02/17  Yes [provider]  sertraline (ZOLOFT) 100 MG tablet Take 100 mg by mouth daily. 10/02/17  Yes [provider]  traZODone (DESYREL) 100 MG tablet Take 100 mg by mouth daily. 10/02/17 11/01/17 Yes [provider]  heparin 5000 UNIT/ML injection Inject 1 mL (5,000 Units total) into the skin every 8 (eight) hours. Patient not taking: Reported on 10/08/2017 08/18/17   Marijean Heath, NP  hydrALAZINE (APRESOLINE) 20 MG/ML injection Inject 0.5 mLs (10 mg total) into the vein every 4 (four) hours as needed (for BP.180/90 mmhg.). Patient not taking: Reported on 10/08/2017 08/18/17   Marijean Heath, NP  insulin aspart (NOVOLOG) 100 UNIT/ML injection Inject 0-20 Units into the skin every 4 (four) hours. Patient not taking: Reported on 10/08/2017 08/18/17   Marijean Heath, NP  metoprolol  tartrate (LOPRESSOR) 5 MG/5ML SOLN injection Inject 2.5-5 mLs (2.5-5 mg total) into the vein every 3 (three) hours as needed (HR 130 & above). Patient not taking: Reported on 10/08/2017 08/18/17   Darlina Sicilian A, NP  midazolam 50 mg in sodium chloride 0.9 % 40 mL Inject 0-10 mg/hr into the vein continuous. Patient not taking: Reported on 10/08/2017 08/18/17   Marijean Heath, NP  ondansetron (ZOFRAN) 4 MG tablet Take 1 tablet (4 mg total) by mouth every 6 (six) hours as needed for nausea. Patient not taking: Reported on 10/08/2017 08/18/17   Marijean Heath, NP    Physical Exam: Vitals:   10/08/17 1401 10/08/17 1500 10/08/17 1531 10/08/17 1601  BP: 133/88 104/66 122/61 112/69  Pulse: 92 78 79 81  Resp: (!) 22 (!) 21 18 (!) 21  Temp:      TempSrc:      SpO2: (S) 99% (S) 100% 100% 99%     General: Appears mildly tachypneic though she is talking in full sentences Eyes:  PERRL, EOMI, normal lids, iris ENT:  grossly normal hearing, lips & tongue, mmm Neck: no LAD, masses or thyromegaly Cardiovascular:  RRR, no m/r/g. No LE edema.  Respiratory:  CTA bilaterally, no w/r/r. Normal respiratory effort. Abdomen: soft, ntnd, NABS Skin: no rash or induration seen on limited exam Musculoskeletal: Edema trace Psychiatric:  grossly normal mood and affect, speech fluent and appropriate, AOx3 Neurologic:  CN 2-12 grossly intact, moves all extremities in coordinated fashion, sensation intact  Labs on Admission: I have personally reviewed following labs and imaging studies  CBC: Recent Labs  Lab 10/08/17 1244  WBC 15.8*  HGB 12.6  HCT 39.1  MCV 84.3  PLT 416*   Basic Metabolic Panel: Recent Labs  Lab 10/08/17 1244  NA 138  K 2.7*  CL 102  CO2 24  GLUCOSE 94  BUN 5*  CREATININE 0.93  CALCIUM 8.3*  MG 1.3*   GFR: Estimated Creatinine Clearance: 110.3 mL/min (by C-G formula based on SCr of 0.93 mg/dL). Liver Function Tests: No results for input(s): AST, ALT,  ALKPHOS, BILITOT, PROT, ALBUMIN in the last 168 hours. No results for  input(s): LIPASE, AMYLASE in the last 168 hours. No results for input(s): AMMONIA in the last 168 hours. Coagulation Profile: No results for input(s): INR, PROTIME in the last 168 hours. Cardiac Enzymes: No results for input(s): CKTOTAL, CKMB, CKMBINDEX, TROPONINI in the last 168 hours. BNP (last 3 results) No results for input(s): PROBNP in the last 8760 hours. HbA1C: No results for input(s): HGBA1C in the last 72 hours. CBG: No results for input(s): GLUCAP in the last 168 hours. Lipid Profile: No results for input(s): CHOL, HDL, LDLCALC, TRIG, CHOLHDL, LDLDIRECT in the last 72 hours. Thyroid Function Tests: No results for input(s): TSH, T4TOTAL, FREET4, T3FREE, THYROIDAB in the last 72 hours. Anemia Panel: No results for input(s): VITAMINB12, FOLATE, FERRITIN, TIBC, IRON, RETICCTPCT in the last 72 hours. Urine analysis:    Component Value Date/Time   COLORURINE YELLOW 08/06/2017 2338   APPEARANCEUR CLEAR 08/06/2017 2338   LABSPEC 1.019 08/06/2017 2338   PHURINE 6.0 08/06/2017 2338   GLUCOSEU NEGATIVE 08/06/2017 2338   GLUCOSEU NEG mg/dL 10/28/2007 2049   HGBUR SMALL (A) 08/06/2017 2338   BILIRUBINUR NEGATIVE 08/06/2017 2338   KETONESUR NEGATIVE 08/06/2017 2338   PROTEINUR NEGATIVE 08/06/2017 2338   UROBILINOGEN 1.0 11/21/2014 0707   NITRITE NEGATIVE 08/06/2017 2338   LEUKOCYTESUR NEGATIVE 08/06/2017 2338    Creatinine Clearance: Estimated Creatinine Clearance: 110.3 mL/min (by C-G formula based on SCr of 0.93 mg/dL).  Sepsis Labs: @LABRCNTIP (procalcitonin:4,lacticidven:4) )No results found for this or any previous visit (from the past 240 hour(s)).   Radiological Exams on Admission: Dg Chest 2 View  Result Date: 10/08/2017 CLINICAL DATA:  Chest pain with cough and vomiting. EXAM: CHEST - 2 VIEW COMPARISON:  08/18/2017. FINDINGS: Cardiomegaly. Retrocardiac density, noted on the April radiograph,  appears to persist although is less prominent. The diffuse airspace opacity noted previously is significantly improved. No bony abnormality. IMPRESSION: Cardiomegaly with overall improving aeration. There could be residual LEFT lower lobe infiltrate. Electronically Signed   By: Staci Righter M.D.   On: 10/08/2017 13:08   Ct Chest Wo Contrast  Result Date: 10/08/2017 CLINICAL DATA:  Worsening chest pain for 2 months EXAM: CT CHEST WITHOUT CONTRAST TECHNIQUE: Multidetector CT imaging of the chest was performed following the standard protocol without IV contrast. COMPARISON:  None. FINDINGS: Cardiovascular: No significant vascular findings. Normal heart size. No pericardial effusion. Mediastinum/Nodes: No enlarged mediastinal or axillary lymph nodes. Thyroid gland, trachea, and esophagus demonstrate no significant findings. Lungs/Pleura: No focal consolidation. Chronic bilateral upper lobe scarring with mild bronchiectasis. Mild lingular, right middle lobe and right lower lobe scarring. No pleural effusion or pneumothorax. Upper Abdomen: No acute upper abdominal abnormality. Musculoskeletal: No acute osseous abnormality. No aggressive osseous lesion. IMPRESSION: 1. No acute cardiopulmonary disease. 2. Mild bilateral chronic interstitial lung disease. Electronically Signed   By: Kathreen Devoid   On: 10/08/2017 14:44    EKG: Independently reviewed.   Assessment/Plan Active Problems:   * No active hospital problems. *   1] pleuritic chest pain-patient admitted with complaints of shortness of breath pleuritic chest pain and generalized weakness .  Patient is found to be tachypneic in the ER.  Chest x-ray and CT of the chest without contrast did not reveal any evidence of pneumonia or any acute findings.  Her d-dimer was added onto the labs which was came back elevated at 0.92.  So CT of the chest with contrast to rule out PE is being done at this time patient has contrast allergy and which makes her itch she  will be prepped for it with Benadryl and steroids and a CT of the chest is ordered to rule out PE.  Since she is a high probability for PE I will start her on heparin drip and hold her oral anticoagulation at this time DC the heparin if CT of the chest does not show any evidence of PE.  I do not see any other reason why she should be so short of breath and is having pleuritic chest pain though she is very deconditioned being in bed for over 2-1/2 months.  2] history of ARDS/H CAP/tracheostomy and prolonged hospital stay  3] history of paroxysmal atrial fibrillation on Eliquis patient reports she did not miss any doses and has been faithful in taking it.  4] nausea and vomiting patient reports nausea and vomiting for 3 days prior to admission to the hospital.  She has not had any vomiting while she was in the ER.  In fact she asked me for a sandwich.  5] multiple electrolyte abnormalities including hypokalemia and hypomagnesemia replete and recheck.  6] with history of diastolic heart failure with ejection fraction 60% from echo April 2019 currently stable.     DVT prophylaxis heparin Code Status: Full code Family Communication: No family available Disposition Plan: TBD Consults called: None Admission status: Inpatient   [name] MD [facility]  If 7PM-7AM, please contact night-coverage [email or web address] Password [email or web address]  10/08/2017, 4:20 PM

## 2017-10-08 NOTE — ED Notes (Addendum)
Pt will not be going to 4W, Room 1427 per [employer] RN after talking with hospitalist. Pt will have Stepdown bed.

## 2017-10-08 NOTE — ED Provider Notes (Addendum)
[facility] DEPT Provider Note   CSN: [id number] Arrival date & time: 10/08/17  1205     History   Chief Complaint Chief Complaint  Patient presents with  . Weakness  . Chest Pain    HPI April [name] is a 47 y.o. female.  HPI  47 year old female with history of COPD/asthma, OSA, AF on eliquis comes in with chief complaint of chest pain, weakness and shortness of breath. Patient had a recent complicated admission for respiratory failure due to COPD, influenza and HCAP -and patient required long-standing intubation, tracheostomy and ecmo.  Patient states that she was discharged on 6/4, but soon after she started having generalized weakness.  Patient also has noted some generalized chest discomfort with mild shortness of breath.  Patient's chest discomfort is described as dull pain with without any specific aggravating or relieving factors.  Patient's shortness of breath is worse with exertion, and she notes mild wheezing and worsening of shortness of breath and laying flat.  Patient does not have any new cough and she denies any fevers or chills.  Review of system is negative for any new leg pain.  Past Medical History:  Diagnosis Date  . Acanthosis nigricans   . Anxiety   . Arthritis    "knees" (04/28/2017)  . Asthma    Followed by Dr. Annamaria Boots (pulmonology); receives every other week omalizumab injections; has frequent exacerbations  . COPD (chronic obstructive pulmonary disease) ([place])    PFTs in 2002, FEV1/FVC 65, no post bronchodilater test done  . Depression   . GERD (gastroesophageal reflux disease)   . Headache(784.0)    "q couple days" (04/28/2017)  . Helicobacter pylori (H. pylori) infection   . Hypertension, essential   . Insomnia   . Menorrhagia   . Morbid obesity ([place])   . OSA on CPAP    Sleep study 2008 - mild OSA, not enough events to titrate CPAP; wears CPAP now/pt on 04/28/2017  . Pneumonia X 1  . Seasonal allergies   .  Shortness of breath   . Tobacco user     Patient Active Problem List   Diagnosis Date Noted  . Emesis, persistent 10/08/2017  . Volume depletion 10/08/2017  . Debility 09/22/2017  . AKI (acute kidney injury) ([place]) 08/18/2017  . Acute respiratory failure with hypoxemia ([place])   . Influenza   . ARDS (adult respiratory distress syndrome) ([place])   . HCAP (healthcare-associated pneumonia) 08/06/2017  . Acute on chronic respiratory failure with hypoxemia ([place]) 08/05/2017  . Unspecified atrial fibrillation ([place]) 07/29/2017  . Acute respiratory failure with hypoxia ([place]) 07/07/2017  . Hyperglycemia, drug-induced 06/06/2017  . New onset atrial fibrillation ([place])   . COPD with acute exacerbation ([place]) 02/10/2017  . Dyspnea 02/04/2017  . Chronic diastolic CHF (congestive heart failure) ([place]) 01/17/2017  . Asthma exacerbation 01/17/2017  . SOB (shortness of breath)   . Metabolic syndrome 23/55/7322  . Moderate persistent asthma with exacerbation 10/19/2016  . Normocytic anemia 10/02/2016  . Elevated hemoglobin A1c 05/05/2016  . GERD (gastroesophageal reflux disease) 08/30/2015  . Anxiety and depression 08/30/2015  . Generalized anxiety disorder 08/30/2015  . Seasonal allergic rhinitis 08/29/2013  . Leukocytosis 10/21/2012  . Tobacco abuse in remission 10/07/2012  . Chronic obstructive pulmonary disease ([place]) 05/07/2012  . Knee pain, bilateral 04/25/2011  . Primary insomnia 03/14/2011  . OSA on CPAP 12/19/2010  . Obstructive sleep apnea 12/19/2010  . Hypokalemia 08/13/2010  . Cervical back pain with evidence of disc disease 04/08/2008  .  Essential hypertension 07/31/2006  . Morbid obesity with body mass index of 50.0-59.9 in adult ([place]) 06/17/2006  . Major depressive disorder, recurrent episode ([place]) 04/10/2006    Past Surgical History:  Procedure Laterality Date  . CARDIOVERSION N/A 05/30/2017   Procedure: CARDIOVERSION;  Surgeon: Sanda Klein, MD;  Location: [facility];   Service: Cardiovascular;  Laterality: N/A;  . REDUCTION MAMMAPLASTY Bilateral 09/2011  . TUBAL LIGATION  1996   bilateral     OB History   None      Home Medications    Prior to Admission medications   Medication Sig Start Date End Date Taking? Authorizing Provider  albuterol (PROVENTIL) (2.5 MG/3ML) 0.083% nebulizer solution Take 3 mLs (2.5 mg total) by nebulization every 2 (two) hours as needed for wheezing or shortness of breath. 08/18/17  Yes Whiteheart, Cristal Ford, NP  amLODipine (NORVASC) 5 MG tablet Take 5 mg by mouth daily. 10/02/17 10/02/18 Yes [provider]  apixaban (ELIQUIS) 5 MG TABS tablet Take 5 mg by mouth 2 (two) times daily. 10/02/17  Yes [provider]  budesonide-formoterol (SYMBICORT) 160-4.5 MCG/ACT inhaler Inhale 2 puffs into the lungs 2 (two) times daily. 10/02/17 10/02/18 Yes [provider]  diclofenac sodium (VOLTAREN) 1 % GEL Apply 1 application topically 4 (four) times daily. 10/02/17 10/02/18 Yes [provider]  furosemide (LASIX) 40 MG tablet Take 40 mg by mouth daily. 10/02/17 10/02/18 Yes [provider]  guaiFENesin (MUCINEX) 600 MG 12 hr tablet Take 600 mg by mouth 3 (three) times daily as needed for cough.  10/02/17 10/12/17 Yes [provider]  ipratropium (ATROVENT HFA) 17 MCG/ACT inhaler Inhale 2 puffs into the lungs 4 (four) times daily as needed. 10/02/17 10/02/18 Yes [provider]  ipratropium-albuterol (DUONEB) 0.5-2.5 (3) MG/3ML SOLN Take 3 mLs by nebulization every 4 (four) hours. 08/18/17  Yes Whiteheart, Cristal Ford, NP  magnesium oxide (MAG-OX) 400 MG tablet Take 2 tablets by mouth 3 (three) times daily. 10/02/17 10/02/18 Yes [provider]  montelukast (SINGULAIR) 10 MG tablet Take 10 mg by mouth at bedtime. 10/02/17  Yes [provider]  pantoprazole (PROTONIX) 40 MG tablet Take 40 mg by mouth daily. 10/02/17 10/02/18 Yes [provider]  Potassium Chloride ER 20 MEQ TBCR Take 20 mEq  by mouth daily. 10/02/17  Yes [provider]  PROAIR HFA 108 (90 Base) MCG/ACT inhaler Take 2 puffs by mouth every 6 (six) hours as needed. 10/02/17  Yes [provider]  sertraline (ZOLOFT) 100 MG tablet Take 100 mg by mouth daily. 10/02/17  Yes [provider]  traZODone (DESYREL) 100 MG tablet Take 100 mg by mouth daily. 10/02/17 11/01/17 Yes [provider]  heparin 5000 UNIT/ML injection Inject 1 mL (5,000 Units total) into the skin every 8 (eight) hours. Patient not taking: Reported on 10/08/2017 08/18/17   Marijean Heath, NP  hydrALAZINE (APRESOLINE) 20 MG/ML injection Inject 0.5 mLs (10 mg total) into the vein every 4 (four) hours as needed (for BP.180/90 mmhg.). Patient not taking: Reported on 10/08/2017 08/18/17   Marijean Heath, NP  insulin aspart (NOVOLOG) 100 UNIT/ML injection Inject 0-20 Units into the skin every 4 (four) hours. Patient not taking: Reported on 10/08/2017 08/18/17   Marijean Heath, NP  metoprolol tartrate (LOPRESSOR) 5 MG/5ML SOLN injection Inject 2.5-5 mLs (2.5-5 mg total) into the vein every 3 (three) hours as needed (HR 130 & above). Patient not taking: Reported on 10/08/2017 08/18/17   Marijean Heath, NP  midazolam 50 mg in sodium chloride 0.9 % 40 mL Inject 0-10 mg/hr into the vein continuous. Patient not taking: Reported on 10/08/2017 08/18/17   Marijean Heath, NP  ondansetron (ZOFRAN) 4 MG tablet Take 1 tablet (4 mg total) by mouth every 6 (six) hours as needed for nausea. Patient not taking: Reported on 10/08/2017 08/18/17   Marijean Heath, NP    Family History Family History  Problem Relation Age of Onset  . Hypertension Mother   . Asthma Daughter   . Cancer Paternal Aunt   . Asthma Maternal Grandmother     Social History Social History   Tobacco Use  . Smoking status: Former Smoker    Packs/day: 0.50    Years: 26.00    Pack years: 13.00    Types: Cigarettes    Last attempt to quit:  09/12/2014    Years since quitting: 3.0  . Smokeless tobacco: Never Used  Substance Use Topics  . Alcohol use: No  . Drug use: No     Allergies   Contrast media [iodinated diagnostic agents] and Oxycodone-acetaminophen   Review of Systems Review of Systems  Constitutional: Positive for activity change and fatigue.  Respiratory: Positive for shortness of breath.   Cardiovascular: Positive for chest pain.  Gastrointestinal: Negative for abdominal pain.  Allergic/Immunologic: Negative for immunocompromised state.  Hematological: Bruises/bleeds easily.  All other systems reviewed and are negative.    Physical Exam Updated Vital Signs BP 122/61   Pulse 79   Temp 97.8 F (36.6 C) (Oral)   Resp 18   LMP 10/07/2017 (Approximate)   SpO2 100%   Physical Exam  Constitutional: She is oriented to person, place, and time. She appears well-developed and well-nourished.  HENT:  Head: Atraumatic.  Eyes: EOM are normal.  Neck: Neck supple.  Cardiovascular: Normal rate, normal heart sounds and normal pulses.  No murmur heard. Pulmonary/Chest: Tachypnea noted. She has no decreased breath sounds. She has wheezes in the right upper field and the left upper field. She has no rhonchi. She has no rales.  Abdominal: Soft. There is no tenderness.  Musculoskeletal:       Right lower leg: She exhibits no edema.       Left lower leg: She exhibits no edema.  Neurological: She is alert and oriented to person, place, and time.  Skin: Skin is warm and dry.  Nursing note and vitals reviewed.    ED Treatments / Results  Labs (all labs ordered are listed, but only abnormal results are displayed) Labs Reviewed  BASIC METABOLIC PANEL - Abnormal; Notable for the following components:      Result Value   Potassium 2.7 (*)    BUN 5 (*)    Calcium 8.3 (*)    All other components within normal limits  CBC - Abnormal; Notable for the following components:   WBC 15.8 (*)    RDW 16.4 (*)     Platelets 401 (*)    All other components within normal limits  MAGNESIUM - Abnormal; Notable for the following components:   Magnesium 1.3 (*)    All other components within normal limits  D-DIMER, QUANTITATIVE (NOT AT [facility]) - Abnormal; Notable for the following components:   D-Dimer, Quant 0.92 (*)    All other components within normal limits  BRAIN NATRIURETIC PEPTIDE  I-STAT TROPONIN, ED  I-STAT BETA HCG BLOOD, ED ([facility], [facility], [facility] ONLY)  I-STAT CG4 LACTIC ACID, ED    EKG EKG Interpretation  Date/Time:  Wednesday October 08 2017 12:18:41 EDT Ventricular Rate:  84 PR Interval:    QRS Duration: 97 QT Interval:  391 QTC Calculation: 463 R Axis:   -41 Text Interpretation:  Atrial fibrillation Left anterior fascicular block Abnormal R-wave progression, late transition Probable left ventricular hypertrophy No acute changes Nonspecific ST and T wave abnormality Confirmed by Varney Biles [phone]) on 10/08/2017 12:57:29 PM   Radiology Dg Chest 2 View  Result Date: 10/08/2017 CLINICAL DATA:  Chest pain with cough and vomiting. EXAM: CHEST - 2 VIEW COMPARISON:  08/18/2017. FINDINGS: Cardiomegaly. Retrocardiac density, noted on the April radiograph, appears to persist although is less prominent. The diffuse airspace opacity noted previously is significantly improved. No bony abnormality. IMPRESSION: Cardiomegaly with overall improving aeration. There could be residual LEFT lower lobe infiltrate. Electronically Signed   By: Staci Righter M.D.   On: 10/08/2017 13:08   Ct Chest Wo Contrast  Result Date: 10/08/2017 CLINICAL DATA:  Worsening chest pain for 2 months EXAM: CT CHEST WITHOUT CONTRAST TECHNIQUE: Multidetector CT imaging of the chest was performed following the standard protocol without IV contrast. COMPARISON:  None. FINDINGS: Cardiovascular: No significant vascular findings. Normal heart size. No pericardial effusion. Mediastinum/Nodes: No enlarged mediastinal or axillary lymph nodes.  Thyroid gland, trachea, and esophagus demonstrate no significant findings. Lungs/Pleura: No focal consolidation. Chronic bilateral upper lobe scarring with mild bronchiectasis. Mild lingular, right middle lobe and right lower lobe scarring. No pleural effusion or pneumothorax. Upper Abdomen: No acute upper abdominal abnormality. Musculoskeletal: No acute osseous abnormality. No aggressive osseous lesion. IMPRESSION: 1. No acute cardiopulmonary disease. 2. Mild bilateral chronic interstitial lung disease. Electronically Signed   By: Kathreen Devoid   On: 10/08/2017 14:44    Procedures Procedures (including critical care time)  Medications Ordered in ED Medications  magnesium sulfate IVPB 2 g 50 mL (2 g Intravenous New Bag/Given 10/08/17 1524)  lactated ringers bolus 1,000 mL (1,000 mLs Intravenous New Bag/Given 10/08/17 1307)  ipratropium-albuterol (DUONEB) 0.5-2.5 (3) MG/3ML nebulizer solution 5 mL (5 mLs Nebulization Given 10/08/17 1305)  potassium chloride SA (K-DUR,KLOR-CON) CR tablet 40 mEq (40 mEq Oral Given 10/08/17 1407)  potassium chloride SA (K-DUR,KLOR-CON) CR tablet 40 mEq (40 mEq Oral Given 10/08/17 1515)     Initial Impression / Assessment and Plan / ED Course  I have reviewed the triage vital signs and the nursing notes.  Pertinent labs & imaging results that were available during my care of the patient were reviewed by me and considered in my medical decision making (see chart for details).     47 year old female with history of A. fib on Eliquis, asthma/COPD, OSA, recent admission to ICU for ARDS secondary to pneumonia and influenza requiring ECMO and trach comes in with chief complaint of weakness and low blood pressure.  Patient was seen at her outpatient follow-up, where her blood pressure was noted to be low.  Patient states that soon after being discharged from the hospital she has been feeling weaker and has exertional dyspnea along with orthopnea-like symptoms.  On my exam,  patient had mild anterior wheezing without any rales.  She has no pitting edema on my exam.   Differential diagnosis includes CHF, pneumonia, pleural effusion, pulmonary edema, PE.  3:34 PM CT chest without contrast does not show any infiltrates. BNP troponin and electrolytes are normal besides low K, Mag.  Patient has mildly elevated white count. She continues to be slightly tachypneic.  I discussed the case with Dr. Zigmund Daniel, hospitalist.  She would prefer  that patient get a d-dimer.  If the d-dimer is elevated they will order the prep for her to get CT angios chest.   No pneumonia or other signs of infection in the ED, therefore no antibiotics given.  Final Clinical Impressions(s) / ED Diagnoses   Final diagnoses:  Dyspnea on exertion  Generalized weakness  Hypomagnesemia  Hypokalemia    ED Discharge Orders    None       Varney Biles, MD 10/08/17 [place], Rogene Meth, MD 10/08/17 1534

## 2017-10-08 NOTE — Assessment & Plan Note (Addendum)
Persistent emesis, volume depletion Needs admit  Send to ED

## 2017-10-08 NOTE — Assessment & Plan Note (Signed)
Volume depletion Emesis Needs IVF and lab assessment Likely needs readmission

## 2017-10-08 NOTE — Telephone Encounter (Signed)
Thanks for the update.  Telemonitoring is not indicated but rather close pulmonary follow-up.  Hope she keeps her appointment with Dr. Joya Gaskins.

## 2017-10-08 NOTE — ED Triage Notes (Signed)
Per EMS pt complaint of worsening weakness and chest pain over past 2 months; sent from primary doctor related to such and noted to be hypotensive.

## 2017-10-08 NOTE — ED Notes (Signed)
Report given to Sophia RN.

## 2017-10-08 NOTE — Patient Instructions (Signed)
You need to be seen in ED for readmission EMS Called

## 2017-10-08 NOTE — Progress Notes (Signed)
Subjective:    Patient ID: April [name], female    DOB: 02/05/1971, 47 y.o.   MRN: [id number]  47 y.o.F s/p ARDS admit on ECMO at [facility] is a 47 y.o. female with history of severe chronic asthma/COPD, OSA, afib, admitted on 4/2 with COPD exacerbation and influenza A, with subsequent respiratory failure requiring ECMO. Transferred on ECMO to [facility] on 4/22. MICU course with respiratory failure requiring tracheostomy and renal failure s/p iHD (now off).     Pt was in rehab after d/c from hosp for two weeks.  Lurline Idol is out.  Pt was orig at [facility] ([facility]) and trf to [facility] ECMO.  Initial adm 07/29/17 with Flu and copd exac  Now: not eating, pt has emesis, notes some edema in feet.  Last meal was two days ago     Shortness of Breath  This is a chronic problem. The current episode started more than 1 year ago. The problem occurs constantly. The problem has been gradually worsening. Associated symptoms include abdominal pain, chest pain, hemoptysis, leg swelling, a sore throat, vomiting and wheezing. Pertinent negatives include no fever, orthopnea or PND. The symptoms are aggravated by lying flat. Associated symptoms comments: Pt is dyspneic with exertion only.   Past Medical History:  Diagnosis Date  . Acanthosis nigricans   . Anxiety   . Arthritis    "knees" (04/28/2017)  . Asthma    Followed by Dr. Annamaria Boots (pulmonology); receives every other week omalizumab injections; has frequent exacerbations  . COPD (chronic obstructive pulmonary disease) ([place])    PFTs in 2002, FEV1/FVC 65, no post bronchodilater test done  . Depression   . GERD (gastroesophageal reflux disease)   . Headache(784.0)    "q couple days" (04/28/2017)  . Helicobacter pylori (H. pylori) infection   . Hypertension, essential   . Insomnia   . Menorrhagia   . Morbid obesity ([place])   . OSA on CPAP    Sleep study 2008 - mild OSA, not enough events to titrate CPAP; wears CPAP now/pt on 04/28/2017  . Pneumonia X 1  . Seasonal  allergies   . Shortness of breath   . Tobacco user      Family History  Problem Relation Age of Onset  . Hypertension Mother   . Asthma Daughter   . Cancer Paternal Aunt   . Asthma Maternal Grandmother      Social History   Socioeconomic History  . Marital status: Single    Spouse name: Not on file  . Number of children: 1  . Years of education: Not on file  . Highest education level: Not on file  Occupational History  . Occupation: CNA  Social Needs  . Financial resource strain: Not on file  . Food insecurity:    Worry: Not on file    Inability: Not on file  . Transportation needs:    Medical: Not on file    Non-medical: Not on file  Tobacco Use  . Smoking status: Former Smoker    Packs/day: 0.50    Years: 26.00    Pack years: 13.00    Types: Cigarettes    Last attempt to quit: 09/12/2014    Years since quitting: 3.0  . Smokeless tobacco: Never Used  Substance and Sexual Activity  . Alcohol use: No  . Drug use: No  . Sexual activity: Never  Lifestyle  . Physical activity:    Days per week: Not on file    Minutes per session:  Not on file  . Stress: Not on file  Relationships  . Social connections:    Talks on phone: Not on file    Gets together: Not on file    Attends religious service: Not on file    Active member of club or organization: Not on file    Attends meetings of clubs or organizations: Not on file    Relationship status: Not on file  . Intimate partner violence:    Fear of current or ex partner: Not on file    Emotionally abused: Not on file    Physically abused: Not on file    Forced sexual activity: Not on file  Other Topics Concern  . 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     Allergies  Allergen Reactions  . Contrast Media [Iodinated Diagnostic Agents] Itching    Ct contrast  . Oxycodone-Acetaminophen Itching     No facility-administered medications prior to visit.     Outpatient Medications Prior to Visit  Medication Sig Dispense Refill  . albuterol (PROVENTIL) (2.5 MG/3ML) 0.083% nebulizer solution Take 3 mLs (2.5 mg total) by nebulization every 2 (two) hours as needed for wheezing or shortness of breath. 75 mL 12  . amLODipine (NORVASC) 5 MG tablet Take 5 mg by mouth daily.    [name] apixaban (ELIQUIS) 5 MG TABS tablet Take 5 mg by mouth 2 (two) times daily.    . budesonide-formoterol (SYMBICORT) 160-4.5 MCG/ACT inhaler Inhale 2 puffs into the lungs 2 (two) times daily.    . diclofenac sodium (VOLTAREN) 1 % GEL Apply 1 application topically 4 (four) times daily.    . furosemide (LASIX) 40 MG tablet Take 40 mg by mouth daily.    [name] guaiFENesin (MUCINEX) 600 MG 12 hr tablet Take 600 mg by mouth 3 (three) times daily as needed for cough.     [name] ipratropium (ATROVENT HFA) 17 MCG/ACT inhaler Inhale 2 puffs into the lungs 4 (four) times daily as needed.    [name] ipratropium-albuterol (DUONEB) 0.5-2.5 (3) MG/3ML SOLN Take 3 mLs by nebulization every 4 (four) hours. 360 mL   . magnesium oxide (MAG-OX) 400 MG tablet Take 2 tablets by mouth 3 (three) times daily.    . montelukast (SINGULAIR) 10 MG tablet Take 10 mg by mouth at bedtime.    . pantoprazole (PROTONIX) 40 MG tablet Take 40 mg by mouth daily.    . Potassium Chloride ER 20 MEQ TBCR Take 20 mEq by mouth daily.    . sertraline (ZOLOFT) 100 MG tablet Take 100 mg by mouth daily.    . traZODone (DESYREL) 100 MG tablet Take 100 mg by mouth daily.    . calcium carbonate (TUMS EX) 750 MG chewable tablet Chew 300 mg by mouth 3 (three) times daily as needed.    . heparin 5000 UNIT/ML injection Inject 1 mL (5,000 Units total) into the skin every 8 (eight) hours. (Patient not taking: Reported on 10/08/2017) 1 mL   . hydrALAZINE (APRESOLINE) 20 MG/ML injection Inject 0.5 mLs (10 mg total) into the vein every 4 (four) hours as needed (for BP.180/90 mmhg.). (Patient not taking: Reported on 10/08/2017) 1 mL   . insulin aspart  (NOVOLOG) 100 UNIT/ML injection Inject 0-20 Units into the skin every 4 (four) hours. (Patient not taking: Reported on 10/08/2017) 10 mL 11  . metoprolol tartrate (LOPRESSOR) 5 MG/5ML SOLN injection Inject 2.5-5 mLs (2.5-5 mg total) into the vein  every 3 (three) hours as needed (HR 130 & above). (Patient not taking: Reported on 10/08/2017) 15 mL   . midazolam 50 mg in sodium chloride 0.9 % 40 mL Inject 0-10 mg/hr into the vein continuous. (Patient not taking: Reported on 10/08/2017)    . ondansetron (ZOFRAN) 4 MG tablet Take 1 tablet (4 mg total) by mouth every 6 (six) hours as needed for nausea. (Patient not taking: Reported on 10/08/2017) 20 tablet 0  . PROAIR HFA 108 (90 Base) MCG/ACT inhaler Take 2 puffs by mouth every 6 (six) hours as needed.  1  . acetaminophen (TYLENOL) 160 MG/5ML solution Place 20.3 mLs (650 mg total) into feeding tube every 4 (four) hours as needed for fever. 120 mL 0  . artificial tears (LACRILUBE) OINT ophthalmic ointment Place 1 application into both eyes every 8 (eight) hours. (Patient not taking: Reported on 10/08/2017)    . azithromycin (ZITHROMAX) 250 MG tablet Take 2 tablets (500 mg total) by mouth daily. (Patient not taking: Reported on 10/08/2017) 6 each 0  . ceFEPIme 2 g in sodium chloride 0.9 % 100 mL Inject 2 g into the vein every 8 (eight) hours. (Patient not taking: Reported on 10/08/2017)    . Chlorhexidine Gluconate Cloth 2 % PADS Apply 6 each topically daily at 6 (six) AM. (Patient not taking: Reported on 10/08/2017)    . chlorhexidine gluconate, MEDLINE KIT, (PERIDEX) 0.12 % solution 15 mLs by Mouth Rinse route 2 (two) times daily. (Patient not taking: Reported on 10/08/2017) 120 mL 0  . cisatracurium 200 mg in sodium chloride 0.9 % 180 mL Inject 74.6-1,492 mcg/min into the vein continuous. (Patient not taking: Reported on 10/08/2017)    . dextrose 5 % solution Inject 50 mL/hr into the vein continuous. (Patient not taking: Reported on 10/08/2017) 50 mL   . famotidine  (PEPCID) 20-0.9 MG/50ML-% Inject 50 mLs (20 mg total) into the vein every 12 (twelve) hours. (Patient not taking: Reported on 10/08/2017) 50 mL   . fentaNYL (SUBLIMAZE) 100 MCG/2ML injection Inject 2 mLs (100 mcg total) into the vein once as needed for severe pain. (Patient not taking: Reported on 10/08/2017) 2 mL 0  . fentaNYL (SUBLIMAZE) SOLN Inject 50 mcg into the vein every 30 (thirty) minutes as needed. (Patient not taking: Reported on 10/08/2017)  0  . fentaNYL 10 mcg/ml SOLN infusion Inject 100-300 mcg/hr into the vein continuous. (Patient not taking: Reported on 10/08/2017)    . insulin glargine (LANTUS) 100 UNIT/ML injection Inject 0.25 mLs (25 Units total) into the skin daily. (Patient not taking: Reported on 10/08/2017) 10 mL 11  . midazolam (VERSED) 1 mg/mL SOLN Inject 2 mg into the vein every 30 (thirty) minutes as needed. (Patient not taking: Reported on 10/08/2017)    . mouth rinse LIQD solution 15 mLs by Mouth Rinse route QID. (Patient not taking: Reported on 10/08/2017)  0  . norepinephrine 16 mg in dextrose 5 % 250 mL Inject 0-40 mcg/min into the vein continuous. (Patient not taking: Reported on 10/08/2017)    . oseltamivir (TAMIFLU) 75 MG capsule Take 1 capsule (75 mg total) by mouth 2 (two) times daily. (Patient not taking: Reported on 10/08/2017)    . sodium bicarbonate 150 mEq in sterile water 850 mL Inject 125 mL/hr into the vein continuous. (Patient not taking: Reported on 10/08/2017)  0      Review of Systems  Constitutional: Positive for appetite change. Negative for fever.       No appt  HENT: Positive  for sore throat.   Respiratory: Positive for cough, hemoptysis, chest tightness, shortness of breath and wheezing.   Cardiovascular: Positive for chest pain and leg swelling. Negative for orthopnea and PND.  Gastrointestinal: Positive for abdominal pain and vomiting. Negative for abdominal distention and blood in stool.  Genitourinary: Negative for difficulty urinating, dysuria  and frequency.  Neurological: Positive for weakness.       Difficulty walking       Objective:   Physical Exam Vitals:   10/08/17 1025 10/08/17 1030  BP: (!) 85/62 102/69  Pulse: 87 82  Resp: (!) 28   Temp: 97.7 F (36.5 C)   TempSrc: Oral   SpO2: 100%   Weight: (!) 303 lb (137.4 kg)     Gen: ill appearing black female  in no distress,  weak  ENT: No lesions,  mouth clear,  oropharynx clear, no postnasal drip  Neck: No JVD, no TMG, no carotid bruits  Lungs: No use of accessory muscles, no dullness to percussion, exp wheezes poor airflow  Cardiovascular: RRR, heart sounds normal, no murmur or gallops, no peripheral edema  Abdomen: soft and NT, no HSM,  BS normal  Musculoskeletal: No deformities, no cyanosis or clubbing  Neuro: alert, non focal, not able to ambulate  Skin: cool, no lesions or rashes, decreased skin turgor  Dg Chest 2 View  Result Date: 10/08/2017 CLINICAL DATA:  Chest pain with cough and vomiting. EXAM: CHEST - 2 VIEW COMPARISON:  08/18/2017. FINDINGS: Cardiomegaly. Retrocardiac density, noted on the April radiograph, appears to persist although is less prominent. The diffuse airspace opacity noted previously is significantly improved. No bony abnormality. IMPRESSION: Cardiomegaly with overall improving aeration. There could be residual LEFT lower lobe infiltrate. Electronically Signed   By: Staci Righter M.D.   On: 10/08/2017 13:08   Ct Chest Wo Contrast  Result Date: 10/08/2017 CLINICAL DATA:  Worsening chest pain for 2 months EXAM: CT CHEST WITHOUT CONTRAST TECHNIQUE: Multidetector CT imaging of the chest was performed following the standard protocol without IV contrast. COMPARISON:  None. FINDINGS: Cardiovascular: No significant vascular findings. Normal heart size. No pericardial effusion. Mediastinum/Nodes: No enlarged mediastinal or axillary lymph nodes. Thyroid gland, trachea, and esophagus demonstrate no significant findings. Lungs/Pleura: No focal  consolidation. Chronic bilateral upper lobe scarring with mild bronchiectasis. Mild lingular, right middle lobe and right lower lobe scarring. No pleural effusion or pneumothorax. Upper Abdomen: No acute upper abdominal abnormality. Musculoskeletal: No acute osseous abnormality. No aggressive osseous lesion. IMPRESSION: 1. No acute cardiopulmonary disease. 2. Mild bilateral chronic interstitial lung disease. Electronically Signed   By: Kathreen Devoid   On: 10/08/2017 14:44     All records from [facility] reviewed on care everywhere     Assessment & Plan:  I personally reviewed all images and lab data in the [facility] system as well as any outside material available during this office visit and agree with the  radiology impressions.   Emesis, persistent Persistent emesis, volume depletion Needs admit  ARDS (adult respiratory distress syndrome) (HCC) Hx of ARDS  resolved  Moderate persistent asthma with exacerbation Moderate persistent asthma with re exacerbation  Volume depletion Volume depletion Emesis Needs IVF and lab assessment Likely needs readmission   Tekeshia was seen today for hospitalization follow-up.  Diagnoses and all orders for this visit:  Volume depletion  Emesis, persistent  ARDS (adult respiratory distress syndrome) (HCC)  Moderate persistent asthma with exacerbation

## 2017-10-08 NOTE — Progress Notes (Addendum)
[place] for Heparin Indication: r/o PE  Allergies  Allergen Reactions  . Contrast Media [Iodinated Diagnostic Agents] Itching    Ct contrast  . Oxycodone-Acetaminophen Itching   Patient Measurements:   Heparin Dosing Weight: 93 kg  Vital Signs: Temp: 97.8 F (36.6 C) (06/12 1224) Temp Source: Oral (06/12 1224) BP: 126/83 (06/12 1630) Pulse Rate: 87 (06/12 1630)  Labs: Recent Labs    10/08/17 1244  HGB 12.6  HCT 39.1  PLT 401*  CREATININE 0.93   Estimated Creatinine Clearance: 110.3 mL/min (by C-G formula based on SCr of 0.93 mg/dL).  Medical History: Past Medical History:  Diagnosis Date  . Acanthosis nigricans   . Anxiety   . Arthritis    "knees" (04/28/2017)  . Asthma    Followed by Dr. Annamaria Boots (pulmonology); receives every other week omalizumab injections; has frequent exacerbations  . COPD (chronic obstructive pulmonary disease) ([place])    PFTs in 2002, FEV1/FVC 65, no post bronchodilater test done  . Depression   . GERD (gastroesophageal reflux disease)   . Headache(784.0)    "q couple days" (04/28/2017)  . Helicobacter pylori (H. pylori) infection   . Hypertension, essential   . Insomnia   . Menorrhagia   . Morbid obesity ([place])   . OSA on CPAP    Sleep study 2008 - mild OSA, not enough events to titrate CPAP; wears CPAP now/pt on 04/28/2017  . Pneumonia X 1  . Seasonal allergies   . Shortness of breath   . Tobacco user    Medications:  Scheduled:  . potassium chloride  40 mEq Oral Q1 Hr x 2   Infusions:  . 0.9 % NaCl with KCl 20 mEq / L    . heparin    . magnesium sulfate 1 - 4 g bolus IVPB     Assessment: 67 yoF to ED with ShOB, pleuritic chest pain,  -Prev admit to [facility] 4/10 for Infl A, resp & renal failure - ARDS > [facility] 4/22 for ECMO. Two wk Rehab stay after d/c from [facility] - Chest CT w/o contrast neg for PE, prep for CT with contrast - Begin Heparin, high risk for PE, D-dimer elevated 0.92,   PMH:  COPD/asthma, Afib on Apixaban 5mg  bid - LD 6/11 ?time? - Apixaban will elevate Heparin levels, will dose Heparin based on aPTT until Hep levels and aPTT correlate  Today, 10/08/2017 - baseline aPTT 36 sec - below desired range - baseline Heparin level 1.58 units/ml - artificially elevated due to Apixaban  Goal of Therapy:  Heparin level 0.3-0.7 units/ml aPTT 66-102 seconds Monitor platelets by anticoagulation protocol: Yes   Plan:   Heparin infusion begun at 1000 units/hr while waiting for baseline coags to result, last dose Apixaban on 6/11, time unknown, but more than 12 hr after last Apixaban  With baseline Heparin level elevated as expected, and baseline aPTT 36 sec will increase Heparin infusion to 1500 units/hr  Check 1st aPTT in 6 hr from start of Heparin  No Daily Heparin level, or aPTT yet  Daily CBC x 5 ordered  Monitor based on aPTT until levels correlate  Minda Ditto PharmD Pager [phone] 10/08/2017, 7:46 PM

## 2017-10-09 LAB — BASIC METABOLIC PANEL
Anion gap: 8 (ref 5–15)
BUN: 6 mg/dL (ref 6–20)
CHLORIDE: 105 mmol/L (ref 101–111)
CO2: 22 mmol/L (ref 22–32)
CREATININE: 0.73 mg/dL (ref 0.44–1.00)
Calcium: 7.7 mg/dL — ABNORMAL LOW (ref 8.9–10.3)
GFR calc Af Amer: >60 mL/min (ref >60)
GFR calc non Af Amer: >60 mL/min (ref >60)
Glucose, Bld: 191 mg/dL — ABNORMAL HIGH (ref 65–99)
Potassium: 4 mmol/L (ref 3.5–5.1)
Sodium: 135 mmol/L (ref 135–145)

## 2017-10-09 LAB — URINALYSIS, ROUTINE W REFLEX MICROSCOPIC
Bilirubin Urine: NEGATIVE
GLUCOSE, UA: NEGATIVE mg/dL
KETONES UR: NEGATIVE mg/dL
LEUKOCYTES UA: NEGATIVE
NITRITE: NEGATIVE
PH: 5 (ref 5.0–8.0)
Protein, ur: NEGATIVE mg/dL
SPECIFIC GRAVITY, URINE: 1.006 (ref 1.005–1.030)

## 2017-10-09 LAB — MRSA PCR SCREENING: MRSA by PCR: NEGATIVE

## 2017-10-09 LAB — CBC
HEMATOCRIT: 33.8 % — AB (ref 36.0–46.0)
HEMOGLOBIN: 10.9 g/dL — AB (ref 12.0–15.0)
MCH: 26.8 pg (ref 26.0–34.0)
MCHC: 32.2 g/dL (ref 30.0–36.0)
MCV: 83 fL (ref 78.0–100.0)
Platelets: 381 10*3/uL (ref 150–400)
RBC: 4.07 MIL/uL (ref 3.87–5.11)
RDW: 16.2 % — ABNORMAL HIGH (ref 11.5–15.5)
WBC: 11.2 10*3/uL — ABNORMAL HIGH (ref 4.0–10.5)

## 2017-10-09 LAB — PROCALCITONIN

## 2017-10-09 LAB — APTT: aPTT: 90 seconds — ABNORMAL HIGH (ref 24–36)

## 2017-10-09 MED ORDER — APIXABAN 5 MG PO TABS
5.0000 mg | ORAL_TABLET | Freq: Two times a day (BID) | ORAL | Status: DC
  Administered 2017-10-09 – 2017-10-15 (×13): 5 mg via ORAL
  Filled 2017-10-09 (×13): qty 1

## 2017-10-09 MED ORDER — MONTELUKAST SODIUM 10 MG PO TABS
10.0000 mg | ORAL_TABLET | Freq: Every day | ORAL | Status: DC
  Administered 2017-10-09 – 2017-10-14 (×6): 10 mg via ORAL
  Filled 2017-10-09 (×6): qty 1

## 2017-10-09 MED ORDER — PANTOPRAZOLE SODIUM 40 MG PO TBEC
40.0000 mg | DELAYED_RELEASE_TABLET | Freq: Every day | ORAL | Status: DC
  Administered 2017-10-09 – 2017-10-15 (×7): 40 mg via ORAL
  Filled 2017-10-09 (×7): qty 1

## 2017-10-09 MED ORDER — ADULT MULTIVITAMIN W/MINERALS CH
1.0000 | ORAL_TABLET | Freq: Every day | ORAL | Status: DC
  Administered 2017-10-09 – 2017-10-15 (×7): 1 via ORAL
  Filled 2017-10-09 (×7): qty 1

## 2017-10-09 MED ORDER — TRAZODONE HCL 100 MG PO TABS
100.0000 mg | ORAL_TABLET | Freq: Every day | ORAL | Status: DC
  Administered 2017-10-09 – 2017-10-13 (×5): 100 mg via ORAL
  Filled 2017-10-09 (×2): qty 1
  Filled 2017-10-09: qty 2
  Filled 2017-10-09: qty 1
  Filled 2017-10-09: qty 2

## 2017-10-09 MED ORDER — IPRATROPIUM-ALBUTEROL 0.5-2.5 (3) MG/3ML IN SOLN
3.0000 mL | Freq: Three times a day (TID) | RESPIRATORY_TRACT | Status: DC
  Administered 2017-10-09 – 2017-10-10 (×2): 3 mL via RESPIRATORY_TRACT
  Filled 2017-10-09 (×2): qty 3

## 2017-10-09 MED ORDER — IPRATROPIUM-ALBUTEROL 0.5-2.5 (3) MG/3ML IN SOLN: 3.0000 mL | RESPIRATORY_TRACT | Status: DC | PRN

## 2017-10-09 MED ORDER — SERTRALINE HCL 100 MG PO TABS
100.0000 mg | ORAL_TABLET | Freq: Every day | ORAL | Status: DC
  Administered 2017-10-09 – 2017-10-15 (×7): 100 mg via ORAL
  Filled 2017-10-09 (×6): qty 1
  Filled 2017-10-09: qty 4
  Filled 2017-10-09: qty 1

## 2017-10-09 MED ORDER — FUROSEMIDE 40 MG PO TABS
40.0000 mg | ORAL_TABLET | Freq: Every day | ORAL | Status: DC
  Administered 2017-10-09 – 2017-10-11 (×3): 40 mg via ORAL
  Filled 2017-10-09 (×3): qty 1

## 2017-10-09 MED ORDER — MOMETASONE FURO-FORMOTEROL FUM 200-5 MCG/ACT IN AERO
2.0000 | INHALATION_SPRAY | Freq: Two times a day (BID) | RESPIRATORY_TRACT | Status: DC
  Administered 2017-10-09 – 2017-10-15 (×12): 2 via RESPIRATORY_TRACT
  Filled 2017-10-09: qty 8.8

## 2017-10-09 MED ORDER — APIXABAN 5 MG PO TABS: 5.0000 mg | ORAL_TABLET | Freq: Two times a day (BID) | ORAL | Status: DC

## 2017-10-09 MED ORDER — IPRATROPIUM-ALBUTEROL 0.5-2.5 (3) MG/3ML IN SOLN
3.0000 mL | Freq: Four times a day (QID) | RESPIRATORY_TRACT | Status: DC
  Administered 2017-10-09: 3 mL via RESPIRATORY_TRACT

## 2017-10-09 NOTE — Progress Notes (Signed)
[place] for Apixapan Indication: a-fib  Allergies  Allergen Reactions  . Contrast Media [Iodinated Diagnostic Agents] Itching    Ct contrast  . Oxycodone-Acetaminophen Itching   Patient Measurements:   Heparin Dosing Weight: 93 kg  Vital Signs: Temp: 98.9 F (37.2 C) (06/13 0435) Temp Source: Oral (06/13 0435) BP: 109/47 (06/13 0500) Pulse Rate: 66 (06/13 0500)  Labs: Recent Labs    10/08/17 1244 10/08/17 1740 10/09/17 0057  HGB 12.6  --  10.9*  HCT 39.1  --  33.8*  PLT 401*  --  381  APTT  --  36 90*  HEPARINUNFRC  --  1.58*  --   CREATININE 0.93  --  0.73   Estimated Creatinine Clearance: 128.2 mL/min (by C-G formula based on SCr of 0.73 mg/dL).  Medical History: Past Medical History:  Diagnosis Date  . Acanthosis nigricans   . Anxiety   . Arthritis    "knees" (04/28/2017)  . Asthma    Followed by Dr. Annamaria Boots (pulmonology); receives every other week omalizumab injections; has frequent exacerbations  . COPD (chronic obstructive pulmonary disease) ([place])    PFTs in 2002, FEV1/FVC 65, no post bronchodilater test done  . Depression   . GERD (gastroesophageal reflux disease)   . Headache(784.0)    "q couple days" (04/28/2017)  . Helicobacter pylori (H. pylori) infection   . Hypertension, essential   . Insomnia   . Menorrhagia   . Morbid obesity ([place])   . OSA on CPAP    Sleep study 2008 - mild OSA, not enough events to titrate CPAP; wears CPAP now/pt on 04/28/2017  . Pneumonia X 1  . Seasonal allergies   . Shortness of breath   . Tobacco user    Medications:  Scheduled:  . apixaban  5 mg Oral BID  . iopamidol       Infusions:  . 0.9 % NaCl with KCl 20 mEq / L 100 mL/hr at 10/09/17 0400   Assessment: 21 yoF to ED with ShOB, pleuritic chest pain,  -Prev admit to [facility] 4/10 for Infl A, resp & renal failure - ARDS > [facility] 4/22 for ECMO. Two wk Rehab stay after d/c from [facility] - Chest CT w/o contrast neg for PE, prep for  CT with contrast - Begin Heparin, high risk for PE, D-dimer elevated 0.92,   PMH: COPD/asthma, Afib on Apixaban 5mg  bid - LD 6/11 ?time? - Apixaban will elevate Heparin levels, will dose Heparin based on aPTT until Hep levels and aPTT correlate   Goal of Therapy:  Heparin level 0.3-0.7 units/ml aPTT 66-102 seconds Monitor platelets by anticoagulation protocol: Yes   Plan:  Resume Apixaban 5 mg bid as was on PTA   April [name] Pager [phone] 10/09/2017, 6:02 AM

## 2017-10-09 NOTE — Progress Notes (Addendum)
[place] for Heparin Indication: r/o PE  Allergies  Allergen Reactions  . Contrast Media [Iodinated Diagnostic Agents] Itching    Ct contrast  . Oxycodone-Acetaminophen Itching   Patient Measurements:   Heparin Dosing Weight: 93 kg  Vital Signs: Temp: 98.2 F (36.8 C) (06/12 2244) Temp Source: Oral (06/12 2244) BP: 129/79 (06/12 2300) Pulse Rate: 82 (06/12 2300)  Labs: Recent Labs    10/08/17 1244 10/08/17 1740 10/09/17 0057  HGB 12.6  --  10.9*  HCT 39.1  --  33.8*  PLT 401*  --  381  APTT  --  36 90*  HEPARINUNFRC  --  1.58*  --   CREATININE 0.93  --   --    Estimated Creatinine Clearance: 110.3 mL/min (by C-G formula based on SCr of 0.93 mg/dL).  Medical History: Past Medical History:  Diagnosis Date  . Acanthosis nigricans   . Anxiety   . Arthritis    "knees" (04/28/2017)  . Asthma    Followed by Dr. Annamaria Boots (pulmonology); receives every other week omalizumab injections; has frequent exacerbations  . COPD (chronic obstructive pulmonary disease) ([place])    PFTs in 2002, FEV1/FVC 65, no post bronchodilater test done  . Depression   . GERD (gastroesophageal reflux disease)   . Headache(784.0)    "q couple days" (04/28/2017)  . Helicobacter pylori (H. pylori) infection   . Hypertension, essential   . Insomnia   . Menorrhagia   . Morbid obesity ([place])   . OSA on CPAP    Sleep study 2008 - mild OSA, not enough events to titrate CPAP; wears CPAP now/pt on 04/28/2017  . Pneumonia X 1  . Seasonal allergies   . Shortness of breath   . Tobacco user    Medications:  Scheduled:  . iopamidol       Infusions:  . 0.9 % NaCl with KCl 20 mEq / L 100 mL/hr at 10/08/17 2309  . heparin 1,500 Units/hr (10/08/17 2015)  . magnesium sulfate 1 - 4 g bolus IVPB     Assessment: 75 yoF to ED with ShOB, pleuritic chest pain,  -Prev admit to [facility] 4/10 for Infl A, resp & renal failure - ARDS > [facility] 4/22 for ECMO. Two wk Rehab stay after d/c  from [facility] - Chest CT w/o contrast neg for PE, prep for CT with contrast - Begin Heparin, high risk for PE, D-dimer elevated 0.92,   PMH: COPD/asthma, Afib on Apixaban 5mg  bid - LD 6/11 ?time? - Apixaban will elevate Heparin levels, will dose Heparin based on aPTT until Hep levels and aPTT correlate  6/12 - baseline aPTT 36 sec - below desired range - baseline Heparin level 1.58 units/ml - artificially elevated due to Apixaban Today, 6/13 -0057 aPtt = 90 sec at goal, no infusion issues or bleeding per RN.   Goal of Therapy:  Heparin level 0.3-0.7 units/ml aPTT 66-102 seconds Monitor platelets by anticoagulation protocol: Yes   Plan:   Continue heparin drip at 1500 units/hr  Daily HL and aPtt  Daily CBC x 5 ordered  Monitor based on aPTT until levels correlate  [name] Pager [phone] 10/09/2017, 1:24 AM

## 2017-10-09 NOTE — Progress Notes (Addendum)
PROGRESS NOTE  April [name] DOB: 1970/11/23 DOA: 10/08/2017 PCP: [name], MD  HPI/Recap of past 24 hours: April [name] is a 47 y.o. female with medical history significant of recent complicated hospital admission with ARDS, influenza, healthcare associated pneumonia requiring intubation tracheostomy at [facility].  Patient was in [place] for 2 months discharged to a rehab facility where she stayed for 2 weeks and then she was discharged home.  Even though she was at the rehab facility she never started walking with a walker.  Currently her main complaints are dyspnea on exertion, shortness of breath, and pleuritic chest pain.  Patient has history of atrial fibrillation and she takes Eliquis for over 3 to 4 years.  She reports that she has not missed any doses.  She also carries a history of congestive heart failure.  She went to her primary care physician, found to have a low blood pressure and she was told to come to the ER.  She is complaining of having cough bringing up yellow phlegm and chills no documented fever at home.  She also complains of having nausea and vomiting for 3 days with no diarrhea, no hematemesis no melena no hematuria. In the ED, CTA chest, neg for PE, CXR questionable for pneumonia.  Patient admitted for further management.  Today, patient notes resolved pleuritic chest pain, improving cough, denies any further nausea/vomiting/diarrhea.  Just complains of generalized weakness and some sore throat.   Assessment/Plan: Active Problems:   Pleuritic chest pain   SOB (shortness of breath)  ??Acute bronchitis Afebrile, resolving leukocytosis Chest x-ray and CT chest no evidence of pneumonia/PE or any other acute findings.  Evidence of mild bilateral chronic interstitial lung disease Respiratory panel pending Procalcitonin pending Continue duonebs, inhalers  Pleuritic chest pain Currently resolved Elevated d-dimer EKG showed Afib CTA chest negative  for PE  Deconditioned/Hx of prolonged hospital stay/generalized weakness Pt was just discharged from rehab Unsure if she was able to ambulate independently PT/OT  P.Afib Rate controlled, not on any meds at home Continue eliquis  Chronic Diastolic HF Appears euvolemic Continue home lasix  Morbid obesity Dietician on board    Code Status: Full  Family Communication: None at bedside  Disposition Plan: Await PT/OT recs   Consultants:  None   Procedures:  None  Antimicrobials:  None   DVT prophylaxis:  Eliquis   Objective: Vitals:   10/09/17 0743 10/09/17 0800 10/09/17 0900 10/09/17 1000  BP:  124/65 (!) 141/83 (!) 133/54  Pulse:  62 72 72  Resp:  (!) 28 (!) 21 20  Temp: 98.5 F (36.9 C)     TempSrc: Oral     SpO2:  100% 100% 99%    Intake/Output Summary (Last 24 hours) at 10/09/2017 1105 Last data filed at 10/09/2017 1000 Gross per 24 hour  Intake 2642.33 ml  Output -  Net 2642.33 ml   There were no vitals filed for this visit.  Exam:   General: NAD  Cardiovascular: S1, S2 present  Respiratory: CTAB  Abdomen: Soft, non-tender, non-distended, BS present  Musculoskeletal: Trace edema bilaterally  Skin: Normal  Psychiatry: Normal mood    Data Reviewed: CBC: Recent Labs  Lab 10/08/17 1244 10/09/17 0057  WBC 15.8* 11.2*  HGB 12.6 10.9*  HCT 39.1 33.8*  MCV 84.3 83.0  PLT 401* 408   Basic Metabolic Panel: Recent Labs  Lab 10/08/17 1244 10/09/17 0057  NA 138 135  K 2.7* 4.0  CL 102 105  CO2  24 22  GLUCOSE 94 191*  BUN 5* 6  CREATININE 0.93 0.73  CALCIUM 8.3* 7.7*  MG 1.3*  --    GFR: Estimated Creatinine Clearance: 128.2 mL/min (by C-G formula based on SCr of 0.73 mg/dL). Liver Function Tests: No results for input(s): AST, ALT, ALKPHOS, BILITOT, PROT, ALBUMIN in the last 168 hours. No results for input(s): LIPASE, AMYLASE in the last 168 hours. No results for input(s): AMMONIA in the last 168 hours. Coagulation  Profile: No results for input(s): INR, PROTIME in the last 168 hours. Cardiac Enzymes: No results for input(s): CKTOTAL, CKMB, CKMBINDEX, TROPONINI in the last 168 hours. BNP (last 3 results) No results for input(s): PROBNP in the last 8760 hours. HbA1C: No results for input(s): HGBA1C in the last 72 hours. CBG: No results for input(s): GLUCAP in the last 168 hours. Lipid Profile: No results for input(s): CHOL, HDL, LDLCALC, TRIG, CHOLHDL, LDLDIRECT in the last 72 hours. Thyroid Function Tests: No results for input(s): TSH, T4TOTAL, FREET4, T3FREE, THYROIDAB in the last 72 hours. Anemia Panel: No results for input(s): VITAMINB12, FOLATE, FERRITIN, TIBC, IRON, RETICCTPCT in the last 72 hours. Urine analysis:    Component Value Date/Time   COLORURINE YELLOW 08/06/2017 2338   APPEARANCEUR CLEAR 08/06/2017 2338   LABSPEC 1.019 08/06/2017 2338   PHURINE 6.0 08/06/2017 2338   GLUCOSEU NEGATIVE 08/06/2017 2338   GLUCOSEU NEG mg/dL 10/28/2007 2049   HGBUR SMALL (A) 08/06/2017 2338   BILIRUBINUR NEGATIVE 08/06/2017 2338   KETONESUR NEGATIVE 08/06/2017 2338   PROTEINUR NEGATIVE 08/06/2017 2338   UROBILINOGEN 1.0 11/21/2014 0707   NITRITE NEGATIVE 08/06/2017 2338   LEUKOCYTESUR NEGATIVE 08/06/2017 2338   Sepsis Labs: @LABRCNTIP (procalcitonin:4,lacticidven:4)  ) Recent Results (from the past 240 hour(s))  MRSA PCR Screening     Status: None   Collection Time: 10/08/17 10:41 PM  Result Value Ref Range Status   MRSA by PCR NEGATIVE NEGATIVE Final    Comment:        The GeneXpert MRSA Assay (FDA approved for NASAL specimens only), is one component of a comprehensive MRSA colonization surveillance program. It is not intended to diagnose MRSA infection nor to guide or monitor treatment for MRSA infections. Performed at [facility], [place] [address], [place], [place] [address]       Studies: Dg Chest 2 View  Result Date: 10/08/2017 CLINICAL DATA:  Chest  pain with cough and vomiting. EXAM: CHEST - 2 VIEW COMPARISON:  08/18/2017. FINDINGS: Cardiomegaly. Retrocardiac density, noted on the April radiograph, appears to persist although is less prominent. The diffuse airspace opacity noted previously is significantly improved. No bony abnormality. IMPRESSION: Cardiomegaly with overall improving aeration. There could be residual LEFT lower lobe infiltrate. Electronically Signed   By: Staci Righter M.D.   On: 10/08/2017 13:08   Ct Chest Wo Contrast  Result Date: 10/08/2017 CLINICAL DATA:  Worsening chest pain for 2 months EXAM: CT CHEST WITHOUT CONTRAST TECHNIQUE: Multidetector CT imaging of the chest was performed following the standard protocol without IV contrast. COMPARISON:  None. FINDINGS: Cardiovascular: No significant vascular findings. Normal heart size. No pericardial effusion. Mediastinum/Nodes: No enlarged mediastinal or axillary lymph nodes. Thyroid gland, trachea, and esophagus demonstrate no significant findings. Lungs/Pleura: No focal consolidation. Chronic bilateral upper lobe scarring with mild bronchiectasis. Mild lingular, right middle lobe and right lower lobe scarring. No pleural effusion or pneumothorax. Upper Abdomen: No acute upper abdominal abnormality. Musculoskeletal: No acute osseous abnormality. No aggressive osseous lesion. IMPRESSION: 1. No acute cardiopulmonary disease.  2. Mild bilateral chronic interstitial lung disease. Electronically Signed   By: Kathreen Devoid   On: 10/08/2017 14:44   Ct Angio Chest Pe W Or Wo Contrast  Result Date: 10/08/2017 CLINICAL DATA:  PE suspected, intermediate prob, positive D-dimer EXAM: CT ANGIOGRAPHY CHEST WITH CONTRAST TECHNIQUE: Multidetector CT imaging of the chest was performed using the standard protocol during bolus administration of intravenous contrast. Multiplanar CT image reconstructions and MIPs were obtained to evaluate the vascular anatomy. CONTRAST:  40mL ISOVUE-370 IOPAMIDOL (ISOVUE-370)  INJECTION 76%. Patient pre-medicated prior to the exam due to contrast allergy. No reactions reported. COMPARISON:  Noncontrast CT 7 hours prior. FINDINGS: Cardiovascular: There are no filling defects within the central pulmonary arteries to the segmental level to suggest pulmonary embolus. Subsegmental branches cannot be assessed due to contrast bolus timing, soft tissue attenuation from habitus, and breathing motion artifact. Heart size upper normal. Normal caliber thoracic aorta without dissection. Left vertebral artery arises directly from the aorta, a normal variant. No pericardial effusion. Mediastinum/Nodes: No enlarged mediastinal or hilar lymph nodes. Esophagus is decompressed. Visualized thyroid gland is normal. Lung parenchyma better assessed on CT earlier this day given degree of motion on the current exam. Lungs/Pleura: No confluent consolidation. Subpleural reticulation in the upper lobes with bronchial thickening and mild upper lobe bronchiectasis, better assessed on prior exam. A few pulmonary cysts noted in the lower lobes. Perihilar upper lobe opacities may represent scarring. No evidence of pulmonary edema. No pleural effusion. Trachea and mainstem bronchi are grossly patent allowing for degree motion. Upper Abdomen: Suspected hepatic steatosis.  No acute findings. Musculoskeletal: There are no acute or suspicious osseous abnormalities. Review of the MIP images confirms the above findings. IMPRESSION: 1. No pulmonary embolus to the segmental level. 2. Lung parenchymal better assessed on noncontrast CT earlier this day given degree of motion on the current exam. Findings suspicious for interstitial lung disease again seen. Electronically Signed   By: Jeb Levering M.D.   On: 10/08/2017 22:18    Scheduled Meds: . apixaban  5 mg Oral BID    Continuous Infusions: . 0.9 % NaCl with KCl 20 mEq / L 100 mL/hr at 10/09/17 0600     LOS: 1 day     Alma Friendly, MD [facility]   If 7PM-7AM, please contact night-coverage [email or web address] Password [email or web address] 10/09/2017, 11:05 AM

## 2017-10-09 NOTE — Progress Notes (Signed)
Initial Nutrition Assessment  DOCUMENTATION CODES:   Morbid obesity  INTERVENTION:  - Will order Magic Cup BID with meals, each supplement provides 290 kcal and 9 grams of protein. - Continue to encourage PO intakes.   NUTRITION DIAGNOSIS:   Inadequate oral intake related to acute illness, nausea, chronic illness, poor appetite as evidenced by per patient/family report.  GOAL:   Patient will meet greater than or equal to 90% of their needs  MONITOR:   PO intake, Supplement acceptance, Weight trends, Labs, I & O's  REASON FOR ASSESSMENT:   Malnutrition Screening Tool  ASSESSMENT:   47 y.o. female with medical history significant of afib, CHF, and recent complicated hospital admission with ARDS, influenza, HCAP requiring intubation tracheostomy. She had been at [facility] but required ECMO and was subsequently transferred to [facility]. Patient was in [place] for 2 months then discharged to a rehab facility where she stayed for 2 weeks. She was just discharged home on 6/6. She presented to the ED with dyspnea on exertion, SOB, and pleuritic chest pain after PCP visit where she was found to have a low blood pressure and she was told to go to the ED. She also complained of having nausea and vomiting for 3 days with no diarrhea, no hematemesis no melena no hematuria or other urinary complaints.  No intakes documented since admission. Patient known to this RD from admission in April. Patient reports that she went home from rehab last Thursday/one week ago. She states that for breakfast she ate toast, breakfast potatoes, and drank orange juice. Her stomach did not hurt prior to eating but has been hurting intermittently since eating. She states that for the past 3-4 days she has been experiencing nausea and that this AM was the most recent episode of emesis.   She states that for the past >1 month she has not had any solid foods until this AM. She was mainly consuming items such as jello and ice  cream. She had tried Ensure at rehab and reported that it hurt her stomach and she does not want to try this supplement again. Encouraged patient to continue to try items at each meal time, to focus on soft items and gave examples from the menu that she may want to try.  NFPE outlined below. Per chart review, she has lost 40 lbs (16% body weight) in the past 2 months. This is significant for time frame. Unsure how much of this weight may have been from fluid as pt was on ECMO as well as iHD while at [facility] Will monitor weight trends closely.  Medications reviewed; 200 mg Solu-cortef x1 yesterday, 2 g IV Mg sulfate x1 yesterday, 4 mg IV Mg sulfate x1 yesterday, 40 mEq oral KCl x2 yesterday. Labs reviewed; Ca: 7.7 mg/dL.  IVF: NS-20 mEq IV KCl @ 100 mL/hr.     NUTRITION - FOCUSED PHYSICAL EXAM:  Completed; no muscle and no fat wasting noted at this time. Suspect that patient does have muscle wasting (atrophy) d/t prolonged hospitalization but that this may be masked by adipose tissue.  Diet Order:   Diet Order           Diet Heart Room service appropriate? Yes; Fluid consistency: Thin  Diet effective now          EDUCATION NEEDS:   No education needs have been identified at this time  Skin:  Skin Assessment: Reviewed RN Assessment  Last BM:  6/12  Height:   Ht Readings from Last  1 Encounters:  08/12/17 5\' 6"  (1.676 m)    Weight:   Wt Readings from Last 1 Encounters:  10/08/17 (!) 303 lb (137.4 kg)    Ideal Body Weight:  59.09 kg  BMI:  48.9 kg/m2  Estimated Nutritional Needs:   Kcal:  9093-1121 (17-18 kcal/kg)  Protein:  130-140 grams  Fluid:  >/= 2 L/day     Jarome Matin, MS, RD, LDN, [facility] Inpatient Clinical Dietitian Pager # [phone] After hours/weekend pager # [phone]

## 2017-10-10 ENCOUNTER — Telehealth: Payer: Self-pay | Admitting: Family Medicine

## 2017-10-10 LAB — CBC WITH DIFFERENTIAL/PLATELET
BASOS PCT: 0 %
Basophils Absolute: 0 10*3/uL (ref 0.0–0.1)
Eosinophils Absolute: 0.3 10*3/uL (ref 0.0–0.7)
Eosinophils Relative: 3 %
HEMATOCRIT: 33.6 % — AB (ref 36.0–46.0)
HEMOGLOBIN: 10.6 g/dL — AB (ref 12.0–15.0)
Lymphocytes Relative: 31 %
Lymphs Abs: 3.5 10*3/uL (ref 0.7–4.0)
MCH: 26.4 pg (ref 26.0–34.0)
MCHC: 31.5 g/dL (ref 30.0–36.0)
MCV: 83.8 fL (ref 78.0–100.0)
MONOS PCT: 13 %
Monocytes Absolute: 1.4 10*3/uL — ABNORMAL HIGH (ref 0.1–1.0)
NEUTROS ABS: 6.2 10*3/uL (ref 1.7–7.7)
NEUTROS PCT: 53 %
Platelets: 398 10*3/uL (ref 150–400)
RBC: 4.01 MIL/uL (ref 3.87–5.11)
RDW: 16.3 % — ABNORMAL HIGH (ref 11.5–15.5)
WBC: 11.5 10*3/uL — ABNORMAL HIGH (ref 4.0–10.5)

## 2017-10-10 LAB — RESPIRATORY PANEL BY PCR
Adenovirus: NOT DETECTED
Bordetella pertussis: NOT DETECTED
CORONAVIRUS OC43-RVPPCR: NOT DETECTED
Chlamydophila pneumoniae: NOT DETECTED
Coronavirus 229E: NOT DETECTED
Coronavirus HKU1: NOT DETECTED
Coronavirus NL63: NOT DETECTED
INFLUENZA B-RVPPCR: NOT DETECTED
Influenza A: NOT DETECTED
MYCOPLASMA PNEUMONIAE-RVPPCR: NOT DETECTED
Metapneumovirus: NOT DETECTED
PARAINFLUENZA VIRUS 1-RVPPCR: NOT DETECTED
Parainfluenza Virus 2: NOT DETECTED
Parainfluenza Virus 3: NOT DETECTED
Parainfluenza Virus 4: NOT DETECTED
Respiratory Syncytial Virus: NOT DETECTED
Rhinovirus / Enterovirus: NOT DETECTED

## 2017-10-10 LAB — BASIC METABOLIC PANEL
ANION GAP: 11 (ref 5–15)
BUN: <5 mg/dL — ABNORMAL LOW (ref 6–20)
CO2: 21 mmol/L — ABNORMAL LOW (ref 22–32)
Calcium: 7.8 mg/dL — ABNORMAL LOW (ref 8.9–10.3)
Chloride: 108 mmol/L (ref 101–111)
Creatinine, Ser: 0.61 mg/dL (ref 0.44–1.00)
GFR calc Af Amer: >60 mL/min (ref >60)
GLUCOSE: 89 mg/dL (ref 65–99)
POTASSIUM: 2.7 mmol/L — AB (ref 3.5–5.1)
Sodium: 140 mmol/L (ref 135–145)

## 2017-10-10 LAB — PROCALCITONIN: Procalcitonin: <0.1 ng/mL

## 2017-10-10 MED ORDER — IPRATROPIUM-ALBUTEROL 0.5-2.5 (3) MG/3ML IN SOLN
3.0000 mL | Freq: Two times a day (BID) | RESPIRATORY_TRACT | Status: DC
  Administered 2017-10-10 – 2017-10-15 (×10): 3 mL via RESPIRATORY_TRACT
  Filled 2017-10-10 (×10): qty 3

## 2017-10-10 MED ORDER — POTASSIUM CHLORIDE CRYS ER 10 MEQ PO TBCR
40.0000 meq | EXTENDED_RELEASE_TABLET | Freq: Two times a day (BID) | ORAL | Status: AC
  Administered 2017-10-10 (×2): 40 meq via ORAL
  Filled 2017-10-10: qty 2
  Filled 2017-10-10: qty 4

## 2017-10-10 MED ORDER — POTASSIUM CHLORIDE 10 MEQ/100ML IV SOLN
10.0000 meq | INTRAVENOUS | Status: AC
Start: 2017-10-10 — End: 2017-10-10
  Administered 2017-10-10 (×2): 10 meq via INTRAVENOUS
  Filled 2017-10-10 (×2): qty 100

## 2017-10-10 MED ORDER — SALINE SPRAY 0.65 % NA SOLN
1.0000 | NASAL | Status: DC | PRN
  Administered 2017-10-10: 1 via NASAL
  Filled 2017-10-10: qty 44

## 2017-10-10 MED ORDER — POTASSIUM CHLORIDE CRYS ER 20 MEQ PO TBCR
40.0000 meq | EXTENDED_RELEASE_TABLET | Freq: Once | ORAL | Status: AC
  Administered 2017-10-10: 40 meq via ORAL
  Filled 2017-10-10: qty 2

## 2017-10-10 MED ORDER — LIP MEDEX EX OINT
TOPICAL_OINTMENT | CUTANEOUS | Status: AC
  Administered 2017-10-10: 15:00:00
  Filled 2017-10-10: qty 7

## 2017-10-10 NOTE — Telephone Encounter (Signed)
Call placed to patient [phone], to check on her status. Spoke with patient and she informed me that she's still at the hospital.  Patient stated that she's doing a little better but doesn't know when she will be discharged. Patient agreed to schedule a hospital f/u appointment with Dr. Joya Gaskins on 10/15/17 @ 11:30am but she's not sure if she will go to rehab or not. Informed patient that we would follow up with her next week. Patient was appreciative.

## 2017-10-10 NOTE — Progress Notes (Signed)
Patient to transfer to 5E 1504, report given to receiving nurse, all questions answered at this time.  Pt. VSS with no s/s of distress noted.  Patient stable at transfer.

## 2017-10-10 NOTE — Evaluation (Signed)
Occupational Therapy Evaluation Patient Details Name: April [name] MRN: [id number] DOB: 10/27/1970 Today's Date: 10/10/2017    History of Present Illness April [name] is a 47 y.o. female with medical history significant of recent complicated hospital admission with ARDS, influenza, healthcare associated pneumonia requiring intubation tracheostomy at [facility].  Patient was in [place] for 2 months discharged to a rehab facility where she stayed for 2 weeks and then she was discharged home.  Even though she was at the rehab facility she never started walking with a walker.  Currently her main complaints are dyspnea on exertion, shortness of breath, and pleuritic chest pain.  Patient has history of atrial fibrillation and she takes Eliquis for over 3 to 4 years.  She reports that she has not missed any doses.  She also carries a history of congestive heart failure.  She went to her primary care physician, found to have a low blood pressure and she was told to come to the ER.  She is complaining of having cough bringing up yellow phlegm and chills no documented fever at home.  She also complains of having nausea and vomiting for 3 days with no diarrhea, no hematemesis no melena no hematuria. In the ED, CTA chest, neg for PE, CXR questionable for pneumonia.  Patient admitted for further management.   Clinical Impression   Pt admitted with the above history.  Pt presents to OT with significant weakness and not able to perform ADL activity. Pt currently with functional limitations due to the deficits listed below (see OT Problem List).  Pt will benefit from skilled OT to increase their safety and independence with ADL and functional mobility for ADL to facilitate discharge to venue listed below.      Follow Up Recommendations  SNF    Equipment Recommendations  None recommended by OT    Recommendations for Other Services       Precautions / Restrictions        Mobility Bed Mobility Overal bed  mobility: Needs Assistance Bed Mobility: Supine to Sit;Sit to Supine     Supine to sit: Min assist Sit to supine: Mod assist   General bed mobility comments: bringing legs up onto bed  Transfers Overall transfer level: Needs assistance Equipment used: Rolling walker (2 wheeled) Transfers: Sit to/from [facility] Sit to Stand: Min assist;Mod assist;+2 safety/equipment Stand pivot transfers: Min assist;+2 safety/equipment            Balance Overall balance assessment: History of Falls;Needs assistance Sitting-balance support: No upper extremity supported Sitting balance-Leahy Scale: Good     Standing balance support: Bilateral upper extremity supported Standing balance-Leahy Scale: Fair                             ADL either performed or assessed with clinical judgement   ADL Overall ADL's : Needs assistance/impaired Eating/Feeding: Set up;Sitting   Grooming: Set up;Sitting;Minimal assistance   Upper Body Bathing: Sitting;Moderate assistance   Lower Body Bathing: Maximal assistance;Sit to/from stand;Cueing for sequencing;Cueing for safety   Upper Body Dressing : Minimal assistance;Sitting   Lower Body Dressing: Maximal assistance;Sit to/from stand;Cueing for sequencing;Cueing for safety;Sitting/lateral leans   Toilet Transfer: RW;BSC;Minimal assistance   Toileting- Clothing Manipulation and Hygiene: Maximal assistance;Sit to/from stand;Cueing for sequencing;Cueing for safety         General ADL Comments: Pt overall very weak. Pt fatigued quickly getting to [facility] and back to bed.  HR increased  to 140 then back to 122 .  Pt verbalized fatigue     Vision Patient Visual Report: No change from baseline              Pertinent Vitals/Pain Pain Assessment: No/denies pain     Hand Dominance     Extremity/Trunk Assessment Upper Extremity Assessment Upper Extremity Assessment: Generalized weakness           Communication  Communication Communication: No difficulties   Cognition Arousal/Alertness: Awake/alert Behavior During Therapy: WFL for tasks assessed/performed Overall Cognitive Status: Within Functional Limits for tasks assessed                                                Home Living Family/patient expects to be discharged to:: Private residence Living Arrangements: Parent Available Help at Discharge: Family;Friend(s);Available PRN/intermittently Type of Home: Apartment Home Access: Stairs to enter Entrance Stairs-Number of Steps: 15-20   Home Layout: One level     Bathroom Shower/Tub: Teacher, early years/pre: Standard     Home Equipment: Environmental consultant - 2 wheels;Cane - quad;Wheelchair - manual;Bedside commode;Hospital bed          Prior Functioning/Environment          Comments: pt had been home from rehab 6 days before coming to [facility]        OT Problem List: Decreased strength;Decreased activity tolerance;Impaired balance (sitting and/or standing);Obesity;Decreased knowledge of use of DME or AE      OT Treatment/Interventions: Self-care/ADL training;Patient/family education;DME and/or AE instruction    OT Goals(Current goals can be found in the care plan section) Acute Rehab OT Goals Patient Stated Goal: be stronger and be able to take care of my self OT Goal Formulation: With patient Time For Goal Achievement: 10/24/17 Potential to Achieve Goals: Good ADL Goals Pt Will Perform Grooming: with supervision;standing Pt Will Perform Lower Body Bathing: with supervision;sitting/lateral leans;sit to/from stand;with adaptive equipment Pt Will Perform Lower Body Dressing: with supervision;sitting/lateral leans;sit to/from stand;with adaptive equipment Pt Will Transfer to Toilet: with supervision;ambulating;bedside commode Pt Will Perform Toileting - Clothing Manipulation and hygiene: with supervision;sit to/from stand Pt/caregiver will Perform Home Exercise  Program: Increased strength;Both right and left upper extremity;Independently;With written HEP provided  OT Frequency: Min 2X/week   Barriers to D/C: Decreased caregiver support          Co-evaluation              AM-PAC PT "6 Clicks" Daily Activity     Outcome Measure Help from another person eating meals?: None Help from another person taking care of personal grooming?: A Little Help from another person toileting, which includes using toliet, bedpan, or urinal?: A Lot Help from another person bathing (including washing, rinsing, drying)?: A Lot Help from another person to put on and taking off regular upper body clothing?: A Little Help from another person to put on and taking off regular lower body clothing?: Total 6 Click Score: 15   End of Session Equipment Utilized During Treatment: Rolling walker Nurse Communication: Mobility status  Activity Tolerance: Patient tolerated treatment well Patient left: with call bell/phone within reach;with bed alarm set  OT Visit Diagnosis: Unsteadiness on feet (R26.81);History of falling (Z91.81);Muscle weakness (generalized) (M62.81);Other abnormalities of gait and mobility (R26.89)                Time: 2956-2130 OT  Time Calculation (min): 21 min Charges:  OT General Charges $OT Visit: 1 Visit OT Evaluation $OT Eval Moderate Complexity: 1 Mod G-Codes:     Kari Baars, [place]  Payton Mccallum D 10/10/2017, 2:29 PM

## 2017-10-10 NOTE — Progress Notes (Signed)
RN requested PO Potassium replacement due to discomfort with administration of IV potassium.  RN directed to not give last IV dose and MD will place an order for PO potassium replacement.  Will continue to follow up.

## 2017-10-10 NOTE — Progress Notes (Addendum)
PROGRESS NOTE  April [name] ZOX:[id number] DOB: [birth year] DOA: 10/08/2017 PCP: [name], MD  HPI/Recap of past 24 hours: April [name] is a 47 y.o. female with medical history significant of recent complicated hospital admission with ARDS, influenza, healthcare associated pneumonia requiring intubation tracheostomy at [facility].  Patient was in [place] for 2 months discharged to a rehab facility where she stayed for 2 weeks and then she was discharged home.  Even though she was at the rehab facility she never started walking with a walker.  Currently her main complaints are dyspnea on exertion, shortness of breath, and pleuritic chest pain.  Patient has history of atrial fibrillation and she takes Eliquis for over 3 to 4 years.  She reports that she has not missed any doses.  She also carries a history of congestive heart failure.  She went to her primary care physician, found to have a low blood pressure and she was told to come to the ER.  She is complaining of having cough bringing up yellow phlegm and chills no documented fever at home.  She also complains of having nausea and vomiting for 3 days with no diarrhea, no hematemesis no melena no hematuria. In the ED, CTA chest, neg for PE, CXR questionable for pneumonia.  Patient admitted for further management.  Today, patient reports feeling better, denies any new complaints, requesting rehab placement.   Assessment/Plan: Active Problems:   Pleuritic chest pain   SOB (shortness of breath)  ??Acute bronchitis Afebrile, resolving leukocytosis Chest x-ray and CT chest no evidence of pneumonia/PE or any other acute findings.  Evidence of mild bilateral chronic interstitial lung disease Respiratory panel negative Procalcitonin negative Continue duonebs, inhalers  Pleuritic chest pain Currently resolved Elevated d-dimer EKG showed Afib CTA chest negative for PE  Deconditioned/Hx of prolonged hospital stay/generalized weakness Pt was  just discharged from rehab Unsure if she was able to ambulate independently PT/OT rec SNF  P.Afib Rate controlled, not on any meds at home Continue eliquis  Chronic Diastolic HF Appears euvolemic Continue home lasix  Hypokalemia Replace prn  Morbid obesity Dietician on board    Code Status: Full  Family Communication: None at bedside  Disposition Plan: SNF   Consultants:  None   Procedures:  None  Antimicrobials:  None   DVT prophylaxis:  Eliquis   Objective: Vitals:   10/10/17 1100 10/10/17 1135 10/10/17 1300 10/10/17 1517  BP: (!) 145/87  (!) 108/48 (!) 115/58  Pulse: 98  97 91  Resp: (!) 24  (!) 24   Temp:  98.1 F (36.7 C)  98.5 F (36.9 C)  TempSrc:  Oral  Oral  SpO2: 100%  (!) 81% 100%    Intake/Output Summary (Last 24 hours) at 10/10/2017 1838 Last data filed at 10/10/2017 1100 Gross per 24 hour  Intake 586.67 ml  Output 1200 ml  Net -613.33 ml   There were no vitals filed for this visit.  Exam:   General: NAD  Cardiovascular: S1, S2 present  Respiratory: CTAB  Abdomen: Soft, non-tender, non-distended, BS present  Musculoskeletal: Trace edema bilaterally  Skin: Normal  Psychiatry: Normal mood    Data Reviewed: CBC: Recent Labs  Lab 10/08/17 1244 10/09/17 0057 10/10/17 0323  WBC 15.8* 11.2* 11.5*  NEUTROABS  --   --  6.2  HGB 12.6 10.9* 10.6*  HCT 39.1 33.8* 33.6*  MCV 84.3 83.0 83.8  PLT 401* 381 811   Basic Metabolic Panel: Recent Labs  Lab 10/08/17 1244 10/09/17  0057 10/10/17 0323  NA 138 135 140  K 2.7* 4.0 2.7*  CL 102 105 108  CO2 24 22 21*  GLUCOSE 94 191* 89  BUN 5* 6 <5*  CREATININE 0.93 0.73 0.61  CALCIUM 8.3* 7.7* 7.8*  MG 1.3*  --   --    GFR: Estimated Creatinine Clearance: 128.2 mL/min (by C-G formula based on SCr of 0.61 mg/dL). Liver Function Tests: No results for input(s): AST, ALT, ALKPHOS, BILITOT, PROT, ALBUMIN in the last 168 hours. No results for input(s): LIPASE, AMYLASE in  the last 168 hours. No results for input(s): AMMONIA in the last 168 hours. Coagulation Profile: No results for input(s): INR, PROTIME in the last 168 hours. Cardiac Enzymes: No results for input(s): CKTOTAL, CKMB, CKMBINDEX, TROPONINI in the last 168 hours. BNP (last 3 results) No results for input(s): PROBNP in the last 8760 hours. HbA1C: No results for input(s): HGBA1C in the last 72 hours. CBG: No results for input(s): GLUCAP in the last 168 hours. Lipid Profile: No results for input(s): CHOL, HDL, LDLCALC, TRIG, CHOLHDL, LDLDIRECT in the last 72 hours. Thyroid Function Tests: No results for input(s): TSH, T4TOTAL, FREET4, T3FREE, THYROIDAB in the last 72 hours. Anemia Panel: No results for input(s): VITAMINB12, FOLATE, FERRITIN, TIBC, IRON, RETICCTPCT in the last 72 hours. Urine analysis:    Component Value Date/Time   COLORURINE YELLOW 10/09/2017 1955   APPEARANCEUR CLEAR 10/09/2017 1955   LABSPEC 1.006 10/09/2017 1955   PHURINE 5.0 10/09/2017 1955   GLUCOSEU NEGATIVE 10/09/2017 1955   GLUCOSEU NEG mg/dL 10/28/2007 2049   HGBUR MODERATE (A) 10/09/2017 1955   BILIRUBINUR NEGATIVE 10/09/2017 1955   KETONESUR NEGATIVE 10/09/2017 1955   PROTEINUR NEGATIVE 10/09/2017 1955   UROBILINOGEN 1.0 11/21/2014 0707   NITRITE NEGATIVE 10/09/2017 1955   LEUKOCYTESUR NEGATIVE 10/09/2017 1955   Sepsis Labs: @LABRCNTIP (procalcitonin:4,lacticidven:4)  ) Recent Results (from the past 240 hour(s))  MRSA PCR Screening     Status: None   Collection Time: 10/08/17 10:41 PM  Result Value Ref Range Status   MRSA by PCR NEGATIVE NEGATIVE Final    Comment:        The GeneXpert MRSA Assay (FDA approved for NASAL specimens only), is one component of a comprehensive MRSA colonization surveillance program. It is not intended to diagnose MRSA infection nor to guide or monitor treatment for MRSA infections. Performed at [facility], [place] [address], [place], [place]  [address]   Respiratory Panel by PCR     Status: None   Collection Time: 10/09/17  6:21 PM  Result Value Ref Range Status   Adenovirus NOT DETECTED NOT DETECTED Final   Coronavirus 229E NOT DETECTED NOT DETECTED Final   Coronavirus HKU1 NOT DETECTED NOT DETECTED Final   Coronavirus NL63 NOT DETECTED NOT DETECTED Final   Coronavirus OC43 NOT DETECTED NOT DETECTED Final   Metapneumovirus NOT DETECTED NOT DETECTED Final   Rhinovirus / Enterovirus NOT DETECTED NOT DETECTED Final   Influenza A NOT DETECTED NOT DETECTED Final   Influenza B NOT DETECTED NOT DETECTED Final   Parainfluenza Virus 1 NOT DETECTED NOT DETECTED Final   Parainfluenza Virus 2 NOT DETECTED NOT DETECTED Final   Parainfluenza Virus 3 NOT DETECTED NOT DETECTED Final   Parainfluenza Virus 4 NOT DETECTED NOT DETECTED Final   Respiratory Syncytial Virus NOT DETECTED NOT DETECTED Final   Bordetella pertussis NOT DETECTED NOT DETECTED Final   Chlamydophila pneumoniae NOT DETECTED NOT DETECTED Final   Mycoplasma pneumoniae NOT DETECTED NOT  DETECTED Final    Comment: Performed at [facility], [place] [address] [place], [place] [address]      Studies: No results found.  Scheduled Meds: . apixaban  5 mg Oral BID  . furosemide  40 mg Oral Daily  . ipratropium-albuterol  3 mL Nebulization BID  . mometasone-formoterol  2 puff Inhalation BID  . montelukast  10 mg Oral QHS  . multivitamin with minerals  1 tablet Oral Daily  . pantoprazole  40 mg Oral Daily  . potassium chloride  40 mEq Oral BID  . sertraline  100 mg Oral Daily  . traZODone  100 mg Oral Daily    Continuous Infusions:    LOS: 2 days     Alma Friendly, MD [facility]   If 7PM-7AM, please contact night-coverage [email or web address] Password [facility] 10/10/2017, 6:38 PM

## 2017-10-10 NOTE — Progress Notes (Signed)
[place] NP paged with critical potassium level of 2.7.

## 2017-10-10 NOTE — Evaluation (Signed)
Physical Therapy Evaluation Patient Details Name: April [name] MRN: [id number] DOB: Feb 22, 1971 Today's Date: 10/10/2017   History of Present Illness  April [name] is a 47 y.o. female with medical history significant of recent complicated hospital admission with ARDS, influenza, healthcare associated pneumonia requiring intubation tracheostomy at [facility]  Patient was in [place] for 2 months discharged to a rehab facility where she stayed for 2 weeks and then she was discharged home.    Currently her main complaints are dyspnea on exertion, shortness of breath, and pleuritic chest pain.  Patient has history of atrial fibrillation , congestive heart failure.  Sent to ED for low BP .  Clinical Impression  The patient  Reports significant challenges for Dc to home due to steps to enter. Patient presents with decreased strength to power up to stand. Patient is interested in rehab. Pt admitted with above diagnosis. Pt currently with functional limitations due to the deficits listed below (see PT Problem List).  Pt will benefit from skilled PT to increase their independence and safety with mobility to allow discharge to the venue listed below.       Follow Up Recommendations SNF    Equipment Recommendations  None recommended by PT    Recommendations for Other Services       Precautions / Restrictions Precautions Precautions: Fall Precaution Comments: monitor VS, incr HR      Mobility  Bed Mobility Overal bed mobility: Needs Assistance Bed Mobility: Supine to Sit;Sit to Supine     Supine to sit: Min assist Sit to supine: Mod assist   General bed mobility comments: bringing legs up onto bed  Transfers Overall transfer level: Needs assistance Equipment used: Rolling walker (2 wheeled) Transfers: Sit to/from [facility] Sit to Stand: Min assist;Mod assist;+2 safety/equipment Stand pivot transfers: Min assist;+2 safety/equipment          Ambulation/Gait                 Stairs            Wheelchair Mobility    Modified Rankin (Stroke Patients Only)       Balance Overall balance assessment: History of Falls;Needs assistance Sitting-balance support: No upper extremity supported Sitting balance-Leahy Scale: Good     Standing balance support: Bilateral upper extremity supported Standing balance-Leahy Scale: Fair                               Pertinent Vitals/Pain Pain Assessment: No/denies pain    Home Living Family/patient expects to be discharged to:: Private residence Living Arrangements: Parent Available Help at Discharge: Family;Friend(s);Available PRN/intermittently Type of Home: Apartment Home Access: Stairs to enter   Entrance Stairs-Number of Steps: 15-20 Home Layout: One level Home Equipment: Walker - 2 wheels;Cane - quad;Wheelchair - manual;Bedside commode;Hospital bed      Prior Function           Comments: pt had been home from rehab 6 days before coming to [place]        Extremity/Trunk Assessment   Upper Extremity Assessment Upper Extremity Assessment: Generalized weakness    Lower Extremity Assessment Lower Extremity Assessment: Generalized weakness    Cervical / Trunk Assessment Cervical / Trunk Assessment: Normal  Communication   Communication: No difficulties  Cognition Arousal/Alertness: Awake/alert Behavior During Therapy: WFL for tasks assessed/performed Overall Cognitive Status: Within Functional Limits for tasks assessed  General Comments      Exercises     Assessment/Plan    PT Assessment Patient needs continued PT services  PT Problem List         PT Treatment Interventions DME instruction;Gait training;Stair training;Functional mobility training;Therapeutic activities;Therapeutic exercise;Patient/family education    PT Goals (Current goals can be found in the Care Plan section)   Acute Rehab PT Goals Patient Stated Goal: be stronger and be able to take care of my self PT Goal Formulation: With patient Time For Goal Achievement: 10/24/17 Potential to Achieve Goals: Good    Frequency Min 2X/week   Barriers to discharge Decreased caregiver support;Inaccessible home environment      Co-evaluation               AM-PAC PT "6 Clicks" Daily Activity  Outcome Measure Difficulty turning over in bed (including adjusting bedclothes, sheets and blankets)?: A Little Difficulty moving from lying on back to sitting on the side of the bed? : A Little Difficulty sitting down on and standing up from a chair with arms (e.g., wheelchair, bedside commode, etc,.)?: A Lot Help needed moving to and from a bed to chair (including a wheelchair)?: A Lot   Help needed climbing 3-5 steps with a railing? : Total 6 Click Score: 11    End of Session   Activity Tolerance: Patient tolerated treatment well Patient left: in bed;with call bell/phone within reach;with bed alarm set Nurse Communication: Mobility status PT Visit Diagnosis: Unsteadiness on feet (R26.81)    Time: 6553-7482 PT Time Calculation (min) (ACUTE ONLY): 19 min   Charges:   PT Evaluation $PT Eval Low Complexity: 1 Low     PT G CodesTresa Endo PT [phone]   Claretha Cooper 10/10/2017, 4:54 PM

## 2017-10-11 ENCOUNTER — Inpatient Hospital Stay (HOSPITAL_COMMUNITY): Payer: Medicaid Other

## 2017-10-11 ENCOUNTER — Other Ambulatory Visit: Payer: Self-pay

## 2017-10-11 LAB — CBC WITH DIFFERENTIAL/PLATELET
BASOS PCT: 0 %
Basophils Absolute: 0 10*3/uL (ref 0.0–0.1)
EOS ABS: 0.4 10*3/uL (ref 0.0–0.7)
EOS PCT: 3 %
HCT: 33.7 % — ABNORMAL LOW (ref 36.0–46.0)
HEMOGLOBIN: 10.4 g/dL — AB (ref 12.0–15.0)
LYMPHS ABS: 3.6 10*3/uL (ref 0.7–4.0)
Lymphocytes Relative: 29 %
MCH: 26.4 pg (ref 26.0–34.0)
MCHC: 30.9 g/dL (ref 30.0–36.0)
MCV: 85.5 fL (ref 78.0–100.0)
MONOS PCT: 14 %
Monocytes Absolute: 1.7 10*3/uL — ABNORMAL HIGH (ref 0.1–1.0)
Neutro Abs: 6.7 10*3/uL (ref 1.7–7.7)
Neutrophils Relative %: 54 %
Platelets: 402 10*3/uL — ABNORMAL HIGH (ref 150–400)
RBC: 3.94 MIL/uL (ref 3.87–5.11)
RDW: 16.5 % — ABNORMAL HIGH (ref 11.5–15.5)
WBC: 12.5 10*3/uL — ABNORMAL HIGH (ref 4.0–10.5)

## 2017-10-11 LAB — BASIC METABOLIC PANEL
Anion gap: 7 (ref 5–15)
BUN: <5 mg/dL — ABNORMAL LOW (ref 6–20)
CALCIUM: 8.2 mg/dL — AB (ref 8.9–10.3)
CHLORIDE: 108 mmol/L (ref 101–111)
CO2: 25 mmol/L (ref 22–32)
CREATININE: 0.6 mg/dL (ref 0.44–1.00)
GFR calc Af Amer: >60 mL/min (ref >60)
GFR calc non Af Amer: >60 mL/min (ref >60)
GLUCOSE: 82 mg/dL (ref 65–99)
Potassium: 3.7 mmol/L (ref 3.5–5.1)
Sodium: 140 mmol/L (ref 135–145)

## 2017-10-11 LAB — URINE CULTURE

## 2017-10-11 MED ORDER — FLUTICASONE PROPIONATE 50 MCG/ACT NA SUSP
1.0000 | Freq: Every day | NASAL | Status: DC
  Administered 2017-10-11 – 2017-10-15 (×5): 1 via NASAL
  Filled 2017-10-11 (×2): qty 16

## 2017-10-11 MED ORDER — MENTHOL 3 MG MT LOZG
1.0000 | LOZENGE | Freq: Three times a day (TID) | OROMUCOSAL | Status: DC
  Administered 2017-10-11 (×2): 3 mg via ORAL
  Filled 2017-10-11: qty 9

## 2017-10-11 MED ORDER — DM-GUAIFENESIN ER 30-600 MG PO TB12
1.0000 | ORAL_TABLET | Freq: Two times a day (BID) | ORAL | Status: DC
  Administered 2017-10-11 – 2017-10-14 (×8): 1 via ORAL
  Filled 2017-10-11 (×9): qty 1

## 2017-10-11 MED ORDER — FUROSEMIDE 40 MG PO TABS
40.0000 mg | ORAL_TABLET | Freq: Two times a day (BID) | ORAL | Status: DC
  Administered 2017-10-11 – 2017-10-12 (×2): 40 mg via ORAL
  Filled 2017-10-11 (×2): qty 1

## 2017-10-11 NOTE — Progress Notes (Signed)
PROGRESS NOTE  KHRYSTINA BONNES KZS:[id number] DOB: 27-Jun-1970 DOA: 10/08/2017 PCP: [name], MD  HPI/Recap of past 24 hours: April [name] is a 47 y.o. female with medical history significant of recent complicated hospital admission with ARDS, influenza, healthcare associated pneumonia requiring intubation tracheostomy at [facility].  Patient was in [place] for 2 months discharged to a rehab facility where she stayed for 2 weeks and then she was discharged home.  Even though she was at the rehab facility she never started walking with a walker.  Currently her main complaints are dyspnea on exertion, shortness of breath, and pleuritic chest pain.  Patient has history of atrial fibrillation and she takes Eliquis for over 3 to 4 years.  She reports that she has not missed any doses.  She also carries a history of congestive heart failure.  She went to her primary care physician, found to have a low blood pressure and she was told to come to the ER.  She is complaining of having cough bringing up yellow phlegm and chills no documented fever at home.  She also complains of having nausea and vomiting for 3 days with no diarrhea, no hematemesis no melena no hematuria. In the ED, CTA chest, neg for PE, CXR questionable for pneumonia.  Patient admitted for further management.  Today, patient reports having sore-throat, some cough, nasal congestion. Denies any chest pain, SOB.   Assessment/Plan: Active Problems:   Pleuritic chest pain   SOB (shortness of breath)  ??Acute viral bronchitis Afebrile, with leukocytosis Chest x-ray and CT chest no evidence of pneumonia/PE or any other acute findings.  Evidence of mild bilateral chronic interstitial lung disease, repeat CXR shows only mild interstitial prominence Respiratory panel negative Procalcitonin negative Continue duonebs, inhalers, mucinex, flonase  Pleuritic chest pain Currently resolved Elevated d-dimer EKG showed Afib CTA chest negative for  PE  Deconditioned/Hx of prolonged hospital stay/generalized weakness Pt was just discharged from rehab Unsure if she was able to ambulate independently PT/OT rec SNF, awaiting placement  P.Afib Rate controlled, not on any meds at home Continue eliquis  Chronic Diastolic HF Appears euvolemic Increased home lasix  Hypokalemia Replace prn  Morbid obesity Dietician on board    Code Status: Full  Family Communication: None at bedside  Disposition Plan: SNF   Consultants:  None   Procedures:  None  Antimicrobials:  None   DVT prophylaxis:  Eliquis   Objective: Vitals:   10/11/17 0724 10/11/17 0845 10/11/17 1307 10/11/17 1422  BP:    (!) 142/81  Pulse:    (!) 107  Resp:    20  Temp:    98.4 F (36.9 C)  TempSrc:    Oral  SpO2:  96%  100%  Weight:   (!) 139.3 kg (307 lb)   Height: 5\' 6"  (1.676 m)  5\' 6"  (1.676 m)     Intake/Output Summary (Last 24 hours) at 10/11/2017 1540 Last data filed at 10/11/2017 1500 Gross per 24 hour  Intake 240 ml  Output 950 ml  Net -710 ml   Filed Weights   10/11/17 1307  Weight: (!) 139.3 kg (307 lb)    Exam:   General: NAD  Cardiovascular: S1, S2 present  Respiratory: CTAB  Abdomen: Soft, non-tender, non-distended, BS present  Musculoskeletal: Trace edema bilaterally  Skin: Normal  Psychiatry: Normal mood    Data Reviewed: CBC: Recent Labs  Lab 10/08/17 1244 10/09/17 0057 10/10/17 0323 10/11/17 0544  WBC 15.8* 11.2* 11.5* 12.5*  NEUTROABS  --   --  6.2 6.7  HGB 12.6 10.9* 10.6* 10.4*  HCT 39.1 33.8* 33.6* 33.7*  MCV 84.3 83.0 83.8 85.5  PLT 401* 381 398 130*   Basic Metabolic Panel: Recent Labs  Lab 10/08/17 1244 10/09/17 0057 10/10/17 0323 10/11/17 0544  NA 138 135 140 140  K 2.7* 4.0 2.7* 3.7  CL 102 105 108 108  CO2 24 22 21* 25  GLUCOSE 94 191* 89 82  BUN 5* 6 <5* <5*  CREATININE 0.93 0.73 0.61 0.60  CALCIUM 8.3* 7.7* 7.8* 8.2*  MG 1.3*  --   --   --    GFR: Estimated  Creatinine Clearance: 129.3 mL/min (by C-G formula based on SCr of 0.6 mg/dL). Liver Function Tests: No results for input(s): AST, ALT, ALKPHOS, BILITOT, PROT, ALBUMIN in the last 168 hours. No results for input(s): LIPASE, AMYLASE in the last 168 hours. No results for input(s): AMMONIA in the last 168 hours. Coagulation Profile: No results for input(s): INR, PROTIME in the last 168 hours. Cardiac Enzymes: No results for input(s): CKTOTAL, CKMB, CKMBINDEX, TROPONINI in the last 168 hours. BNP (last 3 results) No results for input(s): PROBNP in the last 8760 hours. HbA1C: No results for input(s): HGBA1C in the last 72 hours. CBG: No results for input(s): GLUCAP in the last 168 hours. Lipid Profile: No results for input(s): CHOL, HDL, LDLCALC, TRIG, CHOLHDL, LDLDIRECT in the last 72 hours. Thyroid Function Tests: No results for input(s): TSH, T4TOTAL, FREET4, T3FREE, THYROIDAB in the last 72 hours. Anemia Panel: No results for input(s): VITAMINB12, FOLATE, FERRITIN, TIBC, IRON, RETICCTPCT in the last 72 hours. Urine analysis:    Component Value Date/Time   COLORURINE YELLOW 10/09/2017 1955   APPEARANCEUR CLEAR 10/09/2017 1955   LABSPEC 1.006 10/09/2017 1955   PHURINE 5.0 10/09/2017 1955   GLUCOSEU NEGATIVE 10/09/2017 1955   GLUCOSEU NEG mg/dL 10/28/2007 2049   HGBUR MODERATE (A) 10/09/2017 1955   BILIRUBINUR NEGATIVE 10/09/2017 1955   KETONESUR NEGATIVE 10/09/2017 1955   PROTEINUR NEGATIVE 10/09/2017 1955   UROBILINOGEN 1.0 11/21/2014 0707   NITRITE NEGATIVE 10/09/2017 1955   LEUKOCYTESUR NEGATIVE 10/09/2017 1955   Sepsis Labs: @LABRCNTIP (procalcitonin:4,lacticidven:4)  ) Recent Results (from the past 240 hour(s))  MRSA PCR Screening     Status: None   Collection Time: 10/08/17 10:41 PM  Result Value Ref Range Status   MRSA by PCR NEGATIVE NEGATIVE Final    Comment:        The GeneXpert MRSA Assay (FDA approved for NASAL specimens only), is one component of  a comprehensive MRSA colonization surveillance program. It is not intended to diagnose MRSA infection nor to guide or monitor treatment for MRSA infections. Performed at [facility], [place] [address], [place], [place] [address]   Respiratory Panel by PCR     Status: None   Collection Time: 10/09/17  6:21 PM  Result Value Ref Range Status   Adenovirus NOT DETECTED NOT DETECTED Final   Coronavirus 229E NOT DETECTED NOT DETECTED Final   Coronavirus HKU1 NOT DETECTED NOT DETECTED Final   Coronavirus NL63 NOT DETECTED NOT DETECTED Final   Coronavirus OC43 NOT DETECTED NOT DETECTED Final   Metapneumovirus NOT DETECTED NOT DETECTED Final   Rhinovirus / Enterovirus NOT DETECTED NOT DETECTED Final   Influenza A NOT DETECTED NOT DETECTED Final   Influenza B NOT DETECTED NOT DETECTED Final   Parainfluenza Virus 1 NOT DETECTED NOT DETECTED Final   Parainfluenza Virus 2 NOT DETECTED NOT DETECTED Final   Parainfluenza Virus  3 NOT DETECTED NOT DETECTED Final   Parainfluenza Virus 4 NOT DETECTED NOT DETECTED Final   Respiratory Syncytial Virus NOT DETECTED NOT DETECTED Final   Bordetella pertussis NOT DETECTED NOT DETECTED Final   Chlamydophila pneumoniae NOT DETECTED NOT DETECTED Final   Mycoplasma pneumoniae NOT DETECTED NOT DETECTED Final    Comment: Performed at [facility], [place] [address], [place], [place] [address]  Urine Culture     Status: Abnormal   Collection Time: 10/09/17  7:55 PM  Result Value Ref Range Status   Specimen Description   Final    URINE, RANDOM Performed at [place] [address] [place], [place] [address]    Special Requests   Final    NONE Performed at [facility], [place] [name], [place], [place] [address]    Culture MULTIPLE SPECIES PRESENT, SUGGEST RECOLLECTION (A)  Final   Report Status 10/11/2017 FINAL  Final      Studies: Dg Chest Port 1 View  Result Date: 10/11/2017 CLINICAL  DATA:  Shortness of breath, cough and congestion for 4 days. Central chest pain. EXAM: PORTABLE CHEST 1 VIEW COMPARISON:  CT chest and chest radiograph 10/08/2017. FINDINGS: Trachea is midline. Heart size stable. Very mild interstitial prominence. No definite airspace consolidation or pleural fluid on this apical lordotic view. IMPRESSION: 1. No definite acute findings. 2. Mild interstitial prominence, as on 10/08/2017. Electronically Signed   By: Lorin Picket M.D.   On: 10/11/2017 08:42    Scheduled Meds: . apixaban  5 mg Oral BID  . dextromethorphan-guaiFENesin  1 tablet Oral BID  . fluticasone  1 spray Each Nare Daily  . furosemide  40 mg Oral BID  . ipratropium-albuterol  3 mL Nebulization BID  . menthol-cetylpyridinium  1 lozenge Oral TID  . mometasone-formoterol  2 puff Inhalation BID  . montelukast  10 mg Oral QHS  . multivitamin with minerals  1 tablet Oral Daily  . pantoprazole  40 mg Oral Daily  . sertraline  100 mg Oral Daily  . traZODone  100 mg Oral Daily    Continuous Infusions:    LOS: 3 days     Alma Friendly, MD [facility]   If 7PM-7AM, please contact night-coverage [email or web address] Password [facility] 10/11/2017, 3:40 PM

## 2017-10-11 NOTE — Progress Notes (Signed)
Pt. seen earlier this evening for schedule aerosol tx., mentioned home CPAP occasionally being used at home, RT had order obtained by RN for h/s from NP, placed on with M/FFM with Auto-mode, humidifier filled, pt. not currently on oxygen, tolerating well, made aware to notify if needed.

## 2017-10-11 NOTE — Plan of Care (Signed)
  Problem: Safety: Goal: Ability to remain free from injury will improve Outcome: Progressing   

## 2017-10-12 LAB — BASIC METABOLIC PANEL
Anion gap: 9 (ref 5–15)
BUN: 5 mg/dL — AB (ref 6–20)
CALCIUM: 8 mg/dL — AB (ref 8.9–10.3)
CO2: 26 mmol/L (ref 22–32)
Chloride: 104 mmol/L (ref 101–111)
Creatinine, Ser: 0.6 mg/dL (ref 0.44–1.00)
GFR calc Af Amer: >60 mL/min (ref >60)
GFR calc non Af Amer: >60 mL/min (ref >60)
GLUCOSE: 83 mg/dL (ref 65–99)
Potassium: 3.1 mmol/L — ABNORMAL LOW (ref 3.5–5.1)
Sodium: 139 mmol/L (ref 135–145)

## 2017-10-12 LAB — CBC WITH DIFFERENTIAL/PLATELET
BASOS PCT: 0 %
Basophils Absolute: 0 10*3/uL (ref 0.0–0.1)
EOS PCT: 3 %
Eosinophils Absolute: 0.4 10*3/uL (ref 0.0–0.7)
HCT: 33.1 % — ABNORMAL LOW (ref 36.0–46.0)
Hemoglobin: 10.5 g/dL — ABNORMAL LOW (ref 12.0–15.0)
LYMPHS PCT: 26 %
Lymphs Abs: 3.9 10*3/uL (ref 0.7–4.0)
MCH: 26.4 pg (ref 26.0–34.0)
MCHC: 31.7 g/dL (ref 30.0–36.0)
MCV: 83.4 fL (ref 78.0–100.0)
MONO ABS: 1.6 10*3/uL — AB (ref 0.1–1.0)
Monocytes Relative: 11 %
Neutro Abs: 9.4 10*3/uL — ABNORMAL HIGH (ref 1.7–7.7)
Neutrophils Relative %: 60 %
PLATELETS: 428 10*3/uL — AB (ref 150–400)
RBC: 3.97 MIL/uL (ref 3.87–5.11)
RDW: 16.5 % — AB (ref 11.5–15.5)
WBC: 15.4 10*3/uL — ABNORMAL HIGH (ref 4.0–10.5)

## 2017-10-12 LAB — GROUP A STREP BY PCR: Group A Strep by PCR: NOT DETECTED — AB

## 2017-10-12 MED ORDER — AMOXICILLIN-POT CLAVULANATE 875-125 MG PO TABS
1.0000 | ORAL_TABLET | Freq: Two times a day (BID) | ORAL | Status: DC
  Administered 2017-10-12 – 2017-10-15 (×7): 1 via ORAL
  Filled 2017-10-12 (×7): qty 1

## 2017-10-12 MED ORDER — SALINE SPRAY 0.65 % NA SOLN
1.0000 | NASAL | Status: DC | PRN
  Administered 2017-10-13: 1 via NASAL
  Filled 2017-10-12: qty 44

## 2017-10-12 MED ORDER — FUROSEMIDE 40 MG PO TABS
40.0000 mg | ORAL_TABLET | Freq: Every day | ORAL | Status: DC
  Administered 2017-10-13 – 2017-10-15 (×3): 40 mg via ORAL
  Filled 2017-10-12 (×3): qty 1

## 2017-10-12 MED ORDER — MENTHOL 3 MG MT LOZG
1.0000 | LOZENGE | OROMUCOSAL | Status: DC | PRN
  Filled 2017-10-12: qty 9

## 2017-10-12 MED ORDER — NYSTATIN 100000 UNIT/ML MT SUSP
5.0000 mL | Freq: Four times a day (QID) | OROMUCOSAL | Status: DC
  Administered 2017-10-12 – 2017-10-15 (×9): 500000 [IU] via ORAL
  Filled 2017-10-12 (×10): qty 5

## 2017-10-12 MED ORDER — POTASSIUM CHLORIDE CRYS ER 20 MEQ PO TBCR
40.0000 meq | EXTENDED_RELEASE_TABLET | Freq: Once | ORAL | Status: AC
  Administered 2017-10-12: 40 meq via ORAL
  Filled 2017-10-12: qty 2

## 2017-10-12 NOTE — Progress Notes (Signed)
PROGRESS NOTE  April [name] GNF:[id number] DOB: 15-Jan-1971 DOA: 10/08/2017 PCP: [name], MD  HPI/Recap of past 24 hours: April [name] is a 47 y.o. female with medical history significant of recent complicated hospital admission with ARDS, influenza, healthcare associated pneumonia requiring intubation tracheostomy at [facility].  Patient was in [place] for 2 months discharged to a rehab facility where she stayed for 2 weeks and then she was discharged home.  Even though she was at the rehab facility she never started walking with a walker.  Currently her main complaints are dyspnea on exertion, shortness of breath, and pleuritic chest pain.  Patient has history of atrial fibrillation and she takes Eliquis for over 3 to 4 years.  She reports that she has not missed any doses.  She also carries a history of congestive heart failure.  She went to her primary care physician, found to have a low blood pressure and she was told to come to the ER.  She is complaining of having cough bringing up yellow phlegm and chills no documented fever at home.  She also complains of having nausea and vomiting for 3 days with no diarrhea, no hematemesis no melena no hematuria. In the ED, CTA chest, neg for PE, CXR questionable for pneumonia.  Patient admitted for further management.  Today, patient reports having sore-throat, some cough, nasal congestion, facial tenderness. Denies any chest pain, SOB.   Assessment/Plan: Active Problems:   Pleuritic chest pain   SOB (shortness of breath)  Acute sinusitis Afebrile, with leukocytosis trending upwards Nasal congestion, facial tenderness Chest x-ray and CT chest no evidence of pneumonia/PE or any other acute findings.  Evidence of mild bilateral chronic interstitial lung disease, repeat CXR shows only mild interstitial prominence Respiratory panel negative Procalcitonin negative Continue duonebs, inhalers, mucinex, flonase Started on PO Augmentin for 5-7  days  Pleuritic chest pain Currently resolved Elevated d-dimer EKG showed Afib CTA chest negative for PE  Deconditioned/Hx of prolonged hospital stay/generalized weakness Pt was just discharged from rehab Unsure if she was able to ambulate independently PT/OT rec SNF, awaiting placement  P.Afib Rate controlled, not on any meds at home Continue eliquis  Chronic Diastolic HF Appears euvolemic Continue home lasix  Hypokalemia Replace prn  Morbid obesity Dietician on board    Code Status: Full  Family Communication: None at bedside  Disposition Plan: SNF   Consultants:  None   Procedures:  None  Antimicrobials:  Augmentin  DVT prophylaxis:  Eliquis   Objective: Vitals:   10/11/17 1927 10/11/17 2036 10/12/17 0441 10/12/17 0752  BP:  107/71 (!) 113/92   Pulse:  (!) 116 (!) 107   Resp:  19 18   Temp:  98.8 F (37.1 C) 98.4 F (36.9 C)   TempSrc:  Oral Oral   SpO2: 99% 99% 99% 97%  Weight:      Height:        Intake/Output Summary (Last 24 hours) at 10/12/2017 1247 Last data filed at 10/12/2017 0858 Gross per 24 hour  Intake 360 ml  Output 300 ml  Net 60 ml   Filed Weights   10/11/17 1307  Weight: (!) 139.3 kg (307 lb)    Exam:   General: NAD  Cardiovascular: S1, S2 present  Respiratory: CTAB  Abdomen: Soft, non-tender, non-distended, BS present  Musculoskeletal: Trace edema bilaterally  Skin: Normal  Psychiatry: Normal mood    Data Reviewed: CBC: Recent Labs  Lab 10/08/17 1244 10/09/17 0057 10/10/17 0323 10/11/17 0544 10/12/17  0542  WBC 15.8* 11.2* 11.5* 12.5* 15.4*  NEUTROABS  --   --  6.2 6.7 9.4*  HGB 12.6 10.9* 10.6* 10.4* 10.5*  HCT 39.1 33.8* 33.6* 33.7* 33.1*  MCV 84.3 83.0 83.8 85.5 83.4  PLT 401* 381 398 402* 128*   Basic Metabolic Panel: Recent Labs  Lab 10/08/17 1244 10/09/17 0057 10/10/17 0323 10/11/17 0544 10/12/17 0542  NA 138 135 140 140 139  K 2.7* 4.0 2.7* 3.7 3.1*  CL 102 105 108 108  104  CO2 24 22 21* 25 26  GLUCOSE 94 191* 89 82 83  BUN 5* 6 <5* <5* 5*  CREATININE 0.93 0.73 0.61 0.60 0.60  CALCIUM 8.3* 7.7* 7.8* 8.2* 8.0*  MG 1.3*  --   --   --   --    GFR: Estimated Creatinine Clearance: 129.3 mL/min (by C-G formula based on SCr of 0.6 mg/dL). Liver Function Tests: No results for input(s): AST, ALT, ALKPHOS, BILITOT, PROT, ALBUMIN in the last 168 hours. No results for input(s): LIPASE, AMYLASE in the last 168 hours. No results for input(s): AMMONIA in the last 168 hours. Coagulation Profile: No results for input(s): INR, PROTIME in the last 168 hours. Cardiac Enzymes: No results for input(s): CKTOTAL, CKMB, CKMBINDEX, TROPONINI in the last 168 hours. BNP (last 3 results) No results for input(s): PROBNP in the last 8760 hours. HbA1C: No results for input(s): HGBA1C in the last 72 hours. CBG: No results for input(s): GLUCAP in the last 168 hours. Lipid Profile: No results for input(s): CHOL, HDL, LDLCALC, TRIG, CHOLHDL, LDLDIRECT in the last 72 hours. Thyroid Function Tests: No results for input(s): TSH, T4TOTAL, FREET4, T3FREE, THYROIDAB in the last 72 hours. Anemia Panel: No results for input(s): VITAMINB12, FOLATE, FERRITIN, TIBC, IRON, RETICCTPCT in the last 72 hours. Urine analysis:    Component Value Date/Time   COLORURINE YELLOW 10/09/2017 1955   APPEARANCEUR CLEAR 10/09/2017 1955   LABSPEC 1.006 10/09/2017 1955   PHURINE 5.0 10/09/2017 1955   GLUCOSEU NEGATIVE 10/09/2017 1955   GLUCOSEU NEG mg/dL 10/28/2007 2049   HGBUR MODERATE (A) 10/09/2017 1955   BILIRUBINUR NEGATIVE 10/09/2017 1955   KETONESUR NEGATIVE 10/09/2017 1955   PROTEINUR NEGATIVE 10/09/2017 1955   UROBILINOGEN 1.0 11/21/2014 0707   NITRITE NEGATIVE 10/09/2017 1955   LEUKOCYTESUR NEGATIVE 10/09/2017 1955   Sepsis Labs: @LABRCNTIP (procalcitonin:4,lacticidven:4)  ) Recent Results (from the past 240 hour(s))  MRSA PCR Screening     Status: None   Collection Time: 10/08/17  10:41 PM  Result Value Ref Range Status   MRSA by PCR NEGATIVE NEGATIVE Final    Comment:        The GeneXpert MRSA Assay (FDA approved for NASAL specimens only), is one component of a comprehensive MRSA colonization surveillance program. It is not intended to diagnose MRSA infection nor to guide or monitor treatment for MRSA infections. Performed at [facility], [place] [address] [place], [place] [address]   Respiratory Panel by PCR     Status: None   Collection Time: 10/09/17  6:21 PM  Result Value Ref Range Status   Adenovirus NOT DETECTED NOT DETECTED Final   Coronavirus 229E NOT DETECTED NOT DETECTED Final   Coronavirus HKU1 NOT DETECTED NOT DETECTED Final   Coronavirus NL63 NOT DETECTED NOT DETECTED Final   Coronavirus OC43 NOT DETECTED NOT DETECTED Final   Metapneumovirus NOT DETECTED NOT DETECTED Final   Rhinovirus / Enterovirus NOT DETECTED NOT DETECTED Final   Influenza A NOT DETECTED NOT DETECTED  Final   Influenza B NOT DETECTED NOT DETECTED Final   Parainfluenza Virus 1 NOT DETECTED NOT DETECTED Final   Parainfluenza Virus 2 NOT DETECTED NOT DETECTED Final   Parainfluenza Virus 3 NOT DETECTED NOT DETECTED Final   Parainfluenza Virus 4 NOT DETECTED NOT DETECTED Final   Respiratory Syncytial Virus NOT DETECTED NOT DETECTED Final   Bordetella pertussis NOT DETECTED NOT DETECTED Final   Chlamydophila pneumoniae NOT DETECTED NOT DETECTED Final   Mycoplasma pneumoniae NOT DETECTED NOT DETECTED Final    Comment: Performed at [facility], [place] [address] [place], [place] [address]  Urine Culture     Status: Abnormal   Collection Time: 10/09/17  7:55 PM  Result Value Ref Range Status   Specimen Description   Final    URINE, RANDOM Performed at [place] [address] [place], [place] [address]    Special Requests   Final    NONE Performed at [facility], [place] [address] [place], [place] [address]     Culture MULTIPLE SPECIES PRESENT, SUGGEST RECOLLECTION (A)  Final   Report Status 10/11/2017 FINAL  Final      Studies: No results found.  Scheduled Meds: . amoxicillin-clavulanate  1 tablet Oral Q12H  . apixaban  5 mg Oral BID  . dextromethorphan-guaiFENesin  1 tablet Oral BID  . fluticasone  1 spray Each Nare Daily  . furosemide  40 mg Oral BID  . ipratropium-albuterol  3 mL Nebulization BID  . mometasone-formoterol  2 puff Inhalation BID  . montelukast  10 mg Oral QHS  . multivitamin with minerals  1 tablet Oral Daily  . pantoprazole  40 mg Oral Daily  . sertraline  100 mg Oral Daily  . traZODone  100 mg Oral Daily    Continuous Infusions:    LOS: 4 days     Alma Friendly, MD [facility]   If 7PM-7AM, please contact night-coverage [email or web address] Password [facility] 10/12/2017, 12:47 PM

## 2017-10-12 NOTE — Progress Notes (Signed)
Patient does not want to wear CPAP tonight due to to nose being stopped up

## 2017-10-12 NOTE — Progress Notes (Signed)
Patients nose remains to be stopped up so patient does not want to wear her CPAP tonight

## 2017-10-12 NOTE — NC FL2 (Signed)
[place] MEDICAID FL2 LEVEL OF CARE SCREENING TOOL     IDENTIFICATION  Patient Name: April [name] Birthdate: 1971-01-09 Sex: female Admission Date (Current Location): 10/08/2017  [facility] and [place] Number:  Herbalist and Address:  [facility],  [place], [place]      Provider Number: [id number]  Attending Physician Name and Address:  Alma Friendly, MD  Relative Name and Phone Number:       Current Level of Care: Hospital Recommended Level of Care: [place] Prior Approval Number:    Date Approved/Denied:   PASRR Number: [id number]  Discharge Plan: SNF    Current Diagnoses: Patient Active Problem List   Diagnosis Date Noted  . Emesis, persistent 10/08/2017  . Volume depletion 10/08/2017  . Pleuritic chest pain 10/08/2017  . SOB (shortness of breath) 10/08/2017  . Debility 09/22/2017  . HCAP (healthcare-associated pneumonia) 08/06/2017  . Unspecified atrial fibrillation ([place]) 07/29/2017  . COPD with acute exacerbation ([place]) 02/10/2017  . Dyspnea 02/04/2017  . Chronic diastolic CHF (congestive heart failure) ([place]) 01/17/2017  . Metabolic syndrome 70/26/3785  . Moderate persistent asthma with exacerbation 10/19/2016  . Normocytic anemia 10/02/2016  . Elevated hemoglobin A1c 05/05/2016  . GERD (gastroesophageal reflux disease) 08/30/2015  . Anxiety and depression 08/30/2015  . Generalized anxiety disorder 08/30/2015  . Seasonal allergic rhinitis 08/29/2013  . Leukocytosis 10/21/2012  . Tobacco abuse in remission 10/07/2012  . Chronic obstructive pulmonary disease ([place]) 05/07/2012  . Knee pain, bilateral 04/25/2011  . Primary insomnia 03/14/2011  . OSA on CPAP 12/19/2010  . Obstructive sleep apnea 12/19/2010  . Hypokalemia 08/13/2010  . Cervical back pain with evidence of disc disease 04/08/2008  . Essential hypertension 07/31/2006  . Morbid obesity with body mass index of 50.0-59.9 in adult ([place])  06/17/2006  . Major depressive disorder, recurrent episode ([place]) 04/10/2006    Orientation RESPIRATION BLADDER Height & Weight     Self, Time, Situation, Place  Normal Continent Weight: (!) 307 lb (139.3 kg) Height:  5\' 6"  (167.6 cm)  BEHAVIORAL SYMPTOMS/MOOD NEUROLOGICAL BOWEL NUTRITION STATUS      Continent Diet(cardiac)  AMBULATORY STATUS COMMUNICATION OF NEEDS Skin   Extensive Assist Verbally Normal                       Personal Care Assistance Level of Assistance  Bathing, Dressing Bathing Assistance: Maximum assistance   Dressing Assistance: Maximum assistance     Functional Limitations Info             SPECIAL CARE FACTORS FREQUENCY  PT (By licensed PT), OT (By licensed OT)     PT Frequency: 3/wk OT Frequency: 3/wk            Contractures      Additional Factors Info  Code Status, Allergies Code Status Info: FULL Allergies Info: Contrast Media Iodinated Diagnostic Agents, Oxycodone-acetaminophen           Current Medications (10/12/2017):  This is the current hospital active medication list Current Facility-Administered Medications  Medication Dose Route Frequency Provider Last Rate Last Dose  . amoxicillin-clavulanate (AUGMENTIN) 875-125 MG per tablet 1 tablet  1 tablet Oral Q12H Alma Friendly, MD   1 tablet at 10/12/17 0933  . apixaban (ELIQUIS) tablet 5 mg  5 mg Oral BID Alma Friendly, MD   5 mg at 10/12/17 0933  . dextromethorphan-guaiFENesin (MUCINEX DM) 30-600 MG per 12 hr tablet 1 tablet  1  tablet Oral BID Alma Friendly, MD   1 tablet at 10/12/17 3833  . fluticasone (FLONASE) 50 MCG/ACT nasal spray 1 spray  1 spray Each Nare Daily Alma Friendly, MD   1 spray at 10/12/17 0934  . [START ON 10/13/2017] furosemide (LASIX) tablet 40 mg  40 mg Oral Daily Alma Friendly, MD      . ipratropium-albuterol (DUONEB) 0.5-2.5 (3) MG/3ML nebulizer solution 3 mL  3 mL Nebulization Q2H PRN Alma Friendly, MD      .  ipratropium-albuterol (DUONEB) 0.5-2.5 (3) MG/3ML nebulizer solution 3 mL  3 mL Nebulization BID Alma Friendly, MD   3 mL at 10/12/17 0750  . menthol-cetylpyridinium (CEPACOL) lozenge 3 mg  1 lozenge Oral PRN Alma Friendly, MD      . mometasone-formoterol [facility]) 200-5 MCG/ACT inhaler 2 puff  2 puff Inhalation BID Alma Friendly, MD   2 puff at 10/12/17 0750  . montelukast (SINGULAIR) tablet 10 mg  10 mg Oral QHS Alma Friendly, MD   10 mg at 10/11/17 2154  . multivitamin with minerals tablet 1 tablet  1 tablet Oral Daily Alma Friendly, MD   1 tablet at 10/12/17 0933  . pantoprazole (PROTONIX) EC tablet 40 mg  40 mg Oral Daily Alma Friendly, MD   40 mg at 10/12/17 0933  . sertraline (ZOLOFT) tablet 100 mg  100 mg Oral Daily Alma Friendly, MD   100 mg at 10/12/17 0933  . traZODone (DESYREL) tablet 100 mg  100 mg Oral Daily Alma Friendly, MD   100 mg at 10/11/17 3832     Discharge Medications: Please see discharge summary for a list of discharge medications.  Relevant Imaging Results:  Relevant Lab Results:   Additional Information SS#: [id number]  Jorge Ny, LCSW

## 2017-10-13 ENCOUNTER — Encounter (HOSPITAL_COMMUNITY): Payer: Self-pay | Admitting: Radiology

## 2017-10-13 ENCOUNTER — Inpatient Hospital Stay (HOSPITAL_COMMUNITY): Payer: Medicaid Other

## 2017-10-13 LAB — CBC WITH DIFFERENTIAL/PLATELET
BASOS ABS: 0 10*3/uL (ref 0.0–0.1)
Basophils Relative: 0 %
EOS ABS: 0.2 10*3/uL (ref 0.0–0.7)
EOS PCT: 2 %
HCT: 32.6 % — ABNORMAL LOW (ref 36.0–46.0)
Hemoglobin: 10.4 g/dL — ABNORMAL LOW (ref 12.0–15.0)
LYMPHS ABS: 3.4 10*3/uL (ref 0.7–4.0)
LYMPHS PCT: 21 %
MCH: 26.2 pg (ref 26.0–34.0)
MCHC: 31.9 g/dL (ref 30.0–36.0)
MCV: 82.1 fL (ref 78.0–100.0)
MONO ABS: 1.5 10*3/uL — AB (ref 0.1–1.0)
Monocytes Relative: 9 %
Neutro Abs: 10.9 10*3/uL — ABNORMAL HIGH (ref 1.7–7.7)
Neutrophils Relative %: 68 %
Platelets: 428 10*3/uL — ABNORMAL HIGH (ref 150–400)
RBC: 3.97 MIL/uL (ref 3.87–5.11)
RDW: 16.3 % — AB (ref 11.5–15.5)
WBC: 16.1 10*3/uL — ABNORMAL HIGH (ref 4.0–10.5)

## 2017-10-13 LAB — PROCALCITONIN

## 2017-10-13 LAB — URINALYSIS, ROUTINE W REFLEX MICROSCOPIC
BACTERIA UA: NONE SEEN
Bilirubin Urine: NEGATIVE
Glucose, UA: NEGATIVE mg/dL
Ketones, ur: NEGATIVE mg/dL
Nitrite: NEGATIVE
Protein, ur: NEGATIVE mg/dL
Specific Gravity, Urine: 1.005 (ref 1.005–1.030)
pH: 5 (ref 5.0–8.0)

## 2017-10-13 LAB — BASIC METABOLIC PANEL
Anion gap: 11 (ref 5–15)
BUN: 5 mg/dL — AB (ref 6–20)
CO2: 25 mmol/L (ref 22–32)
CREATININE: 0.64 mg/dL (ref 0.44–1.00)
Calcium: 7.7 mg/dL — ABNORMAL LOW (ref 8.9–10.3)
Chloride: 103 mmol/L (ref 101–111)
GFR calc Af Amer: >60 mL/min (ref >60)
GLUCOSE: 81 mg/dL (ref 65–99)
POTASSIUM: 3.1 mmol/L — AB (ref 3.5–5.1)
SODIUM: 139 mmol/L (ref 135–145)

## 2017-10-13 LAB — MAGNESIUM: MAGNESIUM: 1.1 mg/dL — AB (ref 1.7–2.4)

## 2017-10-13 MED ORDER — MAGNESIUM SULFATE 4 GM/100ML IV SOLN
4.0000 g | Freq: Once | INTRAVENOUS | Status: AC
Start: 2017-10-13 — End: 2017-10-13
  Administered 2017-10-13: 4 g via INTRAVENOUS
  Filled 2017-10-13: qty 100

## 2017-10-13 MED ORDER — POTASSIUM CHLORIDE CRYS ER 20 MEQ PO TBCR
40.0000 meq | EXTENDED_RELEASE_TABLET | Freq: Two times a day (BID) | ORAL | Status: AC
  Administered 2017-10-13 (×2): 40 meq via ORAL
  Filled 2017-10-13 (×2): qty 2

## 2017-10-13 NOTE — Progress Notes (Signed)
PROGRESS NOTE  April [name]:[id number] DOB: 29-Apr-1971 DOA: 10/08/2017 PCP: [name], MD  HPI/Recap of past 24 hours: April [name] is a 47 y.o. female with medical history significant of recent complicated hospital admission with ARDS, influenza, healthcare associated pneumonia requiring intubation tracheostomy at [facility] [place] [facility].  Patient was in [place] for 2 months discharged to a rehab facility where she stayed for 2 weeks and then she was discharged home.  Even though she was at the rehab facility she never started walking with a walker.  Currently her main complaints are dyspnea on exertion, shortness of breath, and pleuritic chest pain.  Patient has history of atrial fibrillation and she takes Eliquis for over 3 to 4 years.  She reports that she has not missed any doses.  She also carries a history of congestive heart failure.  She went to her primary care physician, found to have a low blood pressure and she was told to come to the ER.  She is complaining of having cough bringing up yellow phlegm and chills no documented fever at home.  She also complains of having nausea and vomiting for 3 days with no diarrhea, no hematemesis no melena no hematuria. In the ED, CTA chest, neg for PE, CXR questionable for pneumonia.  Patient admitted for further management.  Today, patient reports still having sore-throat, cough, nasal congestion, facial tenderness. Denies any chest pain, SOB.   Assessment/Plan: Active Problems:   Pleuritic chest pain   SOB (shortness of breath)  Acute sinusitis Afebrile, with leukocytosis trending upwards Nasal congestion, facial tenderness Chest x-ray and CT chest no evidence of pneumonia/PE or any other acute findings.  Evidence of mild bilateral chronic interstitial lung disease, repeat CXR shows only mild interstitial prominence CT neck/soft tissue unremarkable Respiratory panel negative Procalcitonin negative Continue duonebs, inhalers, mucinex,  flonase Started on PO Augmentin for 5-7 days  Pleuritic chest pain Currently resolved Elevated d-dimer EKG showed Afib CTA chest negative for PE  Deconditioned/Hx of prolonged hospital stay/generalized weakness Pt was just discharged from rehab Unsure if she was able to ambulate independently PT/OT rec SNF, awaiting placement  P.Afib Rate controlled, not on any meds at home Continue eliquis  Chronic Diastolic HF Appears euvolemic Continue home lasix  Hypokalemia Replace prn  Morbid obesity Dietician on board    Code Status: Full  Family Communication: None at bedside  Disposition Plan: SNF   Consultants:  None   Procedures:  None  Antimicrobials:  Augmentin  DVT prophylaxis:  Eliquis   Objective: Vitals:   10/12/17 2012 10/12/17 2058 10/13/17 0446 10/13/17 0910  BP:  113/83 108/76   Pulse:  (!) 110 (!) 108   Resp:  16 16   Temp:  98.6 F (37 C) 98.2 F (36.8 C)   TempSrc:  Oral Oral   SpO2: 100% 99% 99% 97%  Weight:      Height:       No intake or output data in the 24 hours ending 10/13/17 1933 Filed Weights   10/11/17 1307  Weight: (!) 139.3 kg (307 lb)    Exam:   General: NAD  Cardiovascular: S1, S2 present  Respiratory: CTAB  Abdomen: Soft, non-tender, non-distended, BS present  Musculoskeletal: Trace edema bilaterally  Skin: Normal  Psychiatry: Normal mood    Data Reviewed: CBC: Recent Labs  Lab 10/09/17 0057 10/10/17 0323 10/11/17 0544 10/12/17 0542 10/13/17 0534  WBC 11.2* 11.5* 12.5* 15.4* 16.1*  NEUTROABS  --  6.2 6.7 9.4* 10.9*  HGB 10.9* 10.6* 10.4* 10.5* 10.4*  HCT 33.8* 33.6* 33.7* 33.1* 32.6*  MCV 83.0 83.8 85.5 83.4 82.1  PLT 381 398 402* 428* 425*   Basic Metabolic Panel: Recent Labs  Lab 10/08/17 1244 10/09/17 0057 10/10/17 0323 10/11/17 0544 10/12/17 0542 10/13/17 0534  NA 138 135 140 140 139 139  K 2.7* 4.0 2.7* 3.7 3.1* 3.1*  CL 102 105 108 108 104 103  CO2 24 22 21* 25 26 25    GLUCOSE 94 191* 89 82 83 81  BUN 5* 6 <5* <5* 5* 5*  CREATININE 0.93 0.73 0.61 0.60 0.60 0.64  CALCIUM 8.3* 7.7* 7.8* 8.2* 8.0* 7.7*  MG 1.3*  --   --   --   --  1.1*   GFR: Estimated Creatinine Clearance: 129.3 mL/min (by C-G formula based on SCr of 0.64 mg/dL). Liver Function Tests: No results for input(s): AST, ALT, ALKPHOS, BILITOT, PROT, ALBUMIN in the last 168 hours. No results for input(s): LIPASE, AMYLASE in the last 168 hours. No results for input(s): AMMONIA in the last 168 hours. Coagulation Profile: No results for input(s): INR, PROTIME in the last 168 hours. Cardiac Enzymes: No results for input(s): CKTOTAL, CKMB, CKMBINDEX, TROPONINI in the last 168 hours. BNP (last 3 results) No results for input(s): PROBNP in the last 8760 hours. HbA1C: No results for input(s): HGBA1C in the last 72 hours. CBG: No results for input(s): GLUCAP in the last 168 hours. Lipid Profile: No results for input(s): CHOL, HDL, LDLCALC, TRIG, CHOLHDL, LDLDIRECT in the last 72 hours. Thyroid Function Tests: No results for input(s): TSH, T4TOTAL, FREET4, T3FREE, THYROIDAB in the last 72 hours. Anemia Panel: No results for input(s): VITAMINB12, FOLATE, FERRITIN, TIBC, IRON, RETICCTPCT in the last 72 hours. Urine analysis:    Component Value Date/Time   COLORURINE YELLOW 10/13/2017 1100   APPEARANCEUR CLEAR 10/13/2017 1100   LABSPEC 1.005 10/13/2017 1100   PHURINE 5.0 10/13/2017 1100   GLUCOSEU NEGATIVE 10/13/2017 1100   GLUCOSEU NEG mg/dL 10/28/2007 2049   HGBUR SMALL (A) 10/13/2017 1100   BILIRUBINUR NEGATIVE 10/13/2017 1100   KETONESUR NEGATIVE 10/13/2017 1100   PROTEINUR NEGATIVE 10/13/2017 1100   UROBILINOGEN 1.0 11/21/2014 0707   NITRITE NEGATIVE 10/13/2017 1100   LEUKOCYTESUR SMALL (A) 10/13/2017 1100   Sepsis Labs: @LABRCNTIP (procalcitonin:4,lacticidven:4)  ) Recent Results (from the past 240 hour(s))  MRSA PCR Screening     Status: None   Collection Time: 10/08/17 10:41 PM   Result Value Ref Range Status   MRSA by PCR NEGATIVE NEGATIVE Final    Comment:        The GeneXpert MRSA Assay (FDA approved for NASAL specimens only), is one component of a comprehensive MRSA colonization surveillance program. It is not intended to diagnose MRSA infection nor to guide or monitor treatment for MRSA infections. Performed at [facility], [place] [address] [place], [place] [address]   Respiratory Panel by PCR     Status: None   Collection Time: 10/09/17  6:21 PM  Result Value Ref Range Status   Adenovirus NOT DETECTED NOT DETECTED Final   Coronavirus 229E NOT DETECTED NOT DETECTED Final   Coronavirus HKU1 NOT DETECTED NOT DETECTED Final   Coronavirus NL63 NOT DETECTED NOT DETECTED Final   Coronavirus OC43 NOT DETECTED NOT DETECTED Final   Metapneumovirus NOT DETECTED NOT DETECTED Final   Rhinovirus / Enterovirus NOT DETECTED NOT DETECTED Final   Influenza A NOT DETECTED NOT DETECTED Final   Influenza B NOT DETECTED NOT  DETECTED Final   Parainfluenza Virus 1 NOT DETECTED NOT DETECTED Final   Parainfluenza Virus 2 NOT DETECTED NOT DETECTED Final   Parainfluenza Virus 3 NOT DETECTED NOT DETECTED Final   Parainfluenza Virus 4 NOT DETECTED NOT DETECTED Final   Respiratory Syncytial Virus NOT DETECTED NOT DETECTED Final   Bordetella pertussis NOT DETECTED NOT DETECTED Final   Chlamydophila pneumoniae NOT DETECTED NOT DETECTED Final   Mycoplasma pneumoniae NOT DETECTED NOT DETECTED Final    Comment: Performed at [facility], [place] [address] [place], [place] [address]  Urine Culture     Status: Abnormal   Collection Time: 10/09/17  7:55 PM  Result Value Ref Range Status   Specimen Description   Final    URINE, RANDOM Performed at [place] [address] [place], [place] [address]    Special Requests   Final    NONE Performed at [facility], [place] [address] [place],  [address]    Culture  MULTIPLE SPECIES PRESENT, SUGGEST RECOLLECTION (A)  Final   Report Status 10/11/2017 FINAL  Final  Group A Strep by PCR     Status: Abnormal   Collection Time: 10/12/17  8:20 AM  Result Value Ref Range Status   Group A Strep by PCR NONE DETECTED (A) NOT DETECTED Final    Comment: Performed at [facility], [place] [address], [place],  [address]      Studies: Ct Soft Tissue Neck Wo Contrast  Result Date: 10/13/2017 CLINICAL DATA:  Sore throat.  Contrast allergy. EXAM: CT NECK WITHOUT CONTRAST TECHNIQUE: Multidetector CT imaging of the neck was performed following the standard protocol without intravenous contrast. COMPARISON:  None. FINDINGS: The patient was unable to remain motionless for the exam. Small or subtle lesions could be overlooked. Pharynx and larynx: Normal. No mass or swelling. Salivary glands: No inflammation, mass or stone. Thyroid: Normal. Lymph nodes: None enlarged or abnormal density. Vascular:   Suspected mild atherosclerosis. Limited intracranial: Negative. Visualized orbits: Negative. Mastoids and visualized paranasal sinuses: Negative. Skeleton: Spondylosis.  Largely edentulous. Upper chest: Respiratory degraded.  No visible pneumothorax. Other: None. IMPRESSION: Noncontrast CT of the neck demonstrates no mass or airway compromise. Electronically Signed   By: Staci Righter M.D.   On: 10/13/2017 12:26    Scheduled Meds: . amoxicillin-clavulanate  1 tablet Oral Q12H  . apixaban  5 mg Oral BID  . dextromethorphan-guaiFENesin  1 tablet Oral BID  . fluticasone  1 spray Each Nare Daily  . furosemide  40 mg Oral Daily  . ipratropium-albuterol  3 mL Nebulization BID  . mometasone-formoterol  2 puff Inhalation BID  . montelukast  10 mg Oral QHS  . multivitamin with minerals  1 tablet Oral Daily  . nystatin  5 mL Oral QID  . pantoprazole  40 mg Oral Daily  . potassium chloride  40 mEq Oral BID  . sertraline  100 mg Oral Daily  . traZODone  100 mg Oral  Daily    Continuous Infusions:    LOS: 5 days     Alma Friendly, MD [facility]   If 7PM-7AM, please contact night-coverage [email or web address] Password [facility] 10/13/2017, 7:33 PM

## 2017-10-13 NOTE — Clinical Social Work Note (Signed)
Clinical Social Work Assessment  Patient Details  Name: ZOI DEVINE MRN: [id number] Date of Birth: Sep 07, 1970  Date of referral:  10/13/17               Reason for consult:  Facility Placement, Discharge Planning                Permission sought to share information with:  Facility Art therapist granted to share information::  Yes, Verbal Permission Granted  Name::        Agency::  SNF  Relationship::     Contact Information:     Housing/Transportation Living arrangements for the past 2 months:  Single Family Home Source of Information:  Patient Patient Interpreter Needed:  None Criminal Activity/Legal Involvement Pertinent to Current Situation/Hospitalization:  No - Comment as needed Significant Relationships:  Adult Children Lives with:  Self Do you feel safe going back to the place where you live?  Yes Need for family participation in patient care:  Yes  Care giving concerns:   Patient admitted for shortness of breath and generalized weakness.  Patient was in the [place] hospital for 2 months discharged to a rehab facility where she stayed for 2 weeks and then she was discharged home.  Even though she was at the rehab facility she never started walking with a walker.  Currently her main complaints are dyspnea on exertion, shortness of breath, and pleuritic chest pain.    Social Worker assessment / plan:  CSW met with the patient at bedside, explain role and reason for discharge plan to discuss SNF. Patient reports she live in the home with her mother. She reports she was discharged from SNF about town weeks ago, she reports she has not been able to get up from bed. She reports her home has 15 stairs and she is unable to walk up them. Patient reports her mother and sister were helping with her bathing.  CSW explained SNF process. CSW inquired about patient [employer] does not cover SNF placement.   Barriers: No SNF bed w/o insurance SNF benefits. CSW will  have to discuss patient discharge plan with [place] Surveyor, quantity .  FL2 Complete.   Plan: SNF   Employment status:  Unemployed(Patient applied to SNF. ) Insurance information:  Medicaid In [place] PT Recommendations:  [place] / Referral to community resources:  [place]  Patient/Family's Response to care:  Agreeable and Responding well to care.   Patient/Family's Understanding of and Emotional Response to Diagnosis, Current Treatment, and Prognosis: Patient has a good understanding of her medical history and current diagnosis.   Emotional Assessment Appearance:  Appears younger than stated age Attitude/Demeanor/Rapport:    Affect (typically observed):  Accepting Orientation:  Oriented to Self, Oriented to Place, Oriented to  Time, Oriented to Situation Alcohol / Substance use:  Not Applicable Psych involvement (Current and /or in the community):  No (Comment)  Discharge Needs  Concerns to be addressed:  Discharge Planning Concerns Readmission within the last 30 days:  No Current discharge risk:  Dependent with Mobility Barriers to Discharge:  Continued Medical Work up   [employer], LCSW 10/13/2017, 3:52 PM

## 2017-10-13 NOTE — Progress Notes (Signed)
Pt s hands are trembling. She has not eaten all day because she "has no appetite" / She requested a coke an saltine crakers. I explained the importance of nutrition. She promised that she will order food for supper

## 2017-10-13 NOTE — Progress Notes (Signed)
Physical Therapy Treatment Patient Details Name: April [name] MRN: [id number] DOB: 1971-02-06 Today's Date: 10/13/2017    History of Present Illness April [name] is a 47 y.o. female with medical history significant of recent complicated hospital admission with ARDS, influenza, healthcare associated pneumonia requiring intubation tracheostomy at [facility].  Patient was in [place] for 2 months discharged to a rehab facility where she stayed for 2 weeks and then she was discharged home.    Currently her main complaints are dyspnea on exertion, shortness of breath, and pleuritic chest pain.  Patient has history of atrial fibrillation , congestive heart failure.  Sent to ED for low BP .    PT Comments    Pt ambulated 35' with RW, HR 140 max, SaO2 95% on room air, 3/4 dyspnea, distance limited by shortness of breath. Instructed pt in BUE/LE exercises for strengthening. Good progress with mobility today.    Follow Up Recommendations  SNF     Equipment Recommendations  None recommended by PT    Recommendations for Other Services       Precautions / Restrictions Precautions Precautions: Other (comment) Precaution Comments: monitor VS, incr HR Restrictions Weight Bearing Restrictions: No    Mobility  Bed Mobility Overal bed mobility: Needs Assistance Bed Mobility: Supine to Sit     Supine to sit: Modified independent (Device/Increase time);HOB elevated        Transfers Overall transfer level: Needs assistance Equipment used: Rolling walker (2 wheeled) Transfers: Sit to/from Stand Sit to Stand: Min assist;+2 safety/equipment         General transfer comment: min assist to rise, VCs hand placement  Ambulation/Gait Ambulation/Gait assistance: Min guard;+2 safety/equipment(+2 to follow with chair) Gait Distance (Feet): 35 Feet Assistive device: Rolling walker (2 wheeled) Gait Pattern/deviations: Step-through pattern;Decreased stride length     General Gait Details:  distance limited by 3/4 dyspnea, HR 140 walking, SaO2 95% on room air, followed with recliner as pt fatigues quickly   Stairs             Wheelchair Mobility    Modified Rankin (Stroke Patients Only)       Balance Overall balance assessment: History of Falls;Needs assistance Sitting-balance support: No upper extremity supported Sitting balance-Leahy Scale: Good     Standing balance support: Bilateral upper extremity supported Standing balance-Leahy Scale: Fair                              Cognition Arousal/Alertness: Awake/alert Behavior During Therapy: WFL for tasks assessed/performed Overall Cognitive Status: Within Functional Limits for tasks assessed                                        Exercises General Exercises - Upper Extremity Shoulder Flexion: AROM;Both;10 reps;Seated General Exercises - Lower Extremity Ankle Circles/Pumps: AROM;Both;10 reps;Supine Short Arc Quad: AROM;Both;10 reps;Supine    General Comments        Pertinent Vitals/Pain Pain Assessment: No/denies pain    Home Living                      Prior Function            PT Goals (current goals can now be found in the care plan section) Acute Rehab PT Goals Patient Stated Goal: be stronger and be able to take care of my self  PT Goal Formulation: With patient Time For Goal Achievement: 10/24/17 Potential to Achieve Goals: Good Progress towards PT goals: Progressing toward goals    Frequency    Min 2X/week      PT Plan Current plan remains appropriate    Co-evaluation              AM-PAC PT "6 Clicks" Daily Activity  Outcome Measure  Difficulty turning over in bed (including adjusting bedclothes, sheets and blankets)?: A Little Difficulty moving from lying on back to sitting on the side of the bed? : A Little Difficulty sitting down on and standing up from a chair with arms (e.g., wheelchair, bedside commode, etc,.)?: A  Lot Help needed moving to and from a bed to chair (including a wheelchair)?: A Little Help needed walking in hospital room?: A Little Help needed climbing 3-5 steps with a railing? : A Lot 6 Click Score: 16    End of Session Equipment Utilized During Treatment: Gait belt Activity Tolerance: Patient tolerated treatment well Patient left: in chair;with call bell/phone within reach Nurse Communication: Mobility status PT Visit Diagnosis: Unsteadiness on feet (R26.81);Difficulty in walking, not elsewhere classified (R26.2)     Time: 1440-1455 PT Time Calculation (min) (ACUTE ONLY): 15 min  Charges:  $Gait Training: 8-22 mins                    G Codes:          [name] 10/13/2017, 3:17 PM [phone]

## 2017-10-14 ENCOUNTER — Inpatient Hospital Stay (HOSPITAL_COMMUNITY): Payer: Medicaid Other

## 2017-10-14 LAB — BASIC METABOLIC PANEL
ANION GAP: 11 (ref 5–15)
BUN: <5 mg/dL — ABNORMAL LOW (ref 6–20)
CHLORIDE: 101 mmol/L (ref 101–111)
CO2: 25 mmol/L (ref 22–32)
CREATININE: 0.66 mg/dL (ref 0.44–1.00)
Calcium: 8 mg/dL — ABNORMAL LOW (ref 8.9–10.3)
GFR calc non Af Amer: >60 mL/min (ref >60)
Glucose, Bld: 91 mg/dL (ref 65–99)
Potassium: 3.3 mmol/L — ABNORMAL LOW (ref 3.5–5.1)
SODIUM: 137 mmol/L (ref 135–145)

## 2017-10-14 LAB — CBC WITH DIFFERENTIAL/PLATELET
BASOS ABS: 0 10*3/uL (ref 0.0–0.1)
BASOS PCT: 0 %
Eosinophils Absolute: 0.3 10*3/uL (ref 0.0–0.7)
Eosinophils Relative: 1 %
HEMATOCRIT: 34.9 % — AB (ref 36.0–46.0)
Hemoglobin: 11.1 g/dL — ABNORMAL LOW (ref 12.0–15.0)
Lymphocytes Relative: 22 %
Lymphs Abs: 4.1 10*3/uL — ABNORMAL HIGH (ref 0.7–4.0)
MCH: 26.6 pg (ref 26.0–34.0)
MCHC: 31.8 g/dL (ref 30.0–36.0)
MCV: 83.5 fL (ref 78.0–100.0)
MONO ABS: 1.6 10*3/uL — AB (ref 0.1–1.0)
Monocytes Relative: 8 %
Neutro Abs: 12.6 10*3/uL — ABNORMAL HIGH (ref 1.7–7.7)
Neutrophils Relative %: 69 %
Platelets: 439 10*3/uL — ABNORMAL HIGH (ref 150–400)
RBC: 4.18 MIL/uL (ref 3.87–5.11)
RDW: 16.3 % — AB (ref 11.5–15.5)
WBC: 18.6 10*3/uL — AB (ref 4.0–10.5)

## 2017-10-14 LAB — MAGNESIUM: Magnesium: 1.8 mg/dL (ref 1.7–2.4)

## 2017-10-14 LAB — URINE CULTURE

## 2017-10-14 MED ORDER — ZOLPIDEM TARTRATE 5 MG PO TABS
5.0000 mg | ORAL_TABLET | Freq: Every day | ORAL | Status: DC
  Administered 2017-10-14: 5 mg via ORAL
  Filled 2017-10-14: qty 1

## 2017-10-14 NOTE — Progress Notes (Signed)
Occupational Therapy Treatment Patient Details Name: April [name] MRN: [id number] DOB: 11/22/1970 Today's Date: 10/14/2017    History of present illness April [name] is a 47 y.o. female with medical history significant of recent complicated hospital admission with ARDS, influenza, healthcare associated pneumonia requiring intubation tracheostomy at [facility].  Patient was in [place] for 2 months discharged to a rehab facility where she stayed for 2 weeks and then she was discharged home.    Currently her main complaints are dyspnea on exertion, shortness of breath, and pleuritic chest pain.  Patient has history of atrial fibrillation , congestive heart failure.  Sent to ED for low BP .   OT comments  Pt will benefit from AE  Follow Up Recommendations  SNF    Equipment Recommendations  None recommended by OT    Recommendations for Other Services      Precautions / Restrictions Precautions Precautions: Fall       Mobility Bed Mobility Overal bed mobility: Needs Assistance             General bed mobility comments: pt in chair  Transfers Overall transfer level: Needs assistance Equipment used: Rolling walker (2 wheeled) Transfers: Sit to/from Stand Sit to Stand: Min assist;+2 safety/equipment         General transfer comment: min assist to rise, VCs hand placement    Balance Overall balance assessment: History of Falls;Needs assistance Sitting-balance support: No upper extremity supported Sitting balance-Leahy Scale: Good     Standing balance support: Bilateral upper extremity supported Standing balance-Leahy Scale: Fair                             ADL either performed or assessed with clinical judgement   ADL       Grooming: Standing;Min guard               Lower Body Dressing: Moderate assistance;Sit to/from stand;Cueing for safety;Cueing for compensatory techniques;Cueing for sequencing Lower Body Dressing Details (indicate cue type  and reason): will benefit from AE Toilet Transfer: RW;BSC;Min guard   Toileting- Clothing Manipulation and Hygiene: Maximal assistance;Sit to/from stand;Cueing for sequencing;Cueing for safety Toileting - Clothing Manipulation Details (indicate cue type and reason): will benefit from a toilet aid             Vision Patient Visual Report: No change from baseline            Cognition Arousal/Alertness: Awake/alert Behavior During Therapy: WFL for tasks assessed/performed Overall Cognitive Status: Within Functional Limits for tasks assessed                                           Prior Functioning/Environment              Frequency  Min 2X/week        Progress Toward Goals  OT Goals(current goals can now be found in the care plan section)  Progress towards OT goals: Progressing toward goals     Plan Discharge plan remains appropriate       AM-PAC PT "6 Clicks" Daily Activity     Outcome Measure   Help from another person eating meals?: None Help from another person taking care of personal grooming?: A Little Help from another person toileting, which includes using toliet, bedpan, or urinal?: A Lot Help from another person  bathing (including washing, rinsing, drying)?: A Lot Help from another person to put on and taking off regular upper body clothing?: A Little Help from another person to put on and taking off regular lower body clothing?: Total 6 Click Score: 15    End of Session Equipment Utilized During Treatment: Rolling walker  OT Visit Diagnosis: Unsteadiness on feet (R26.81);History of falling (Z91.81);Muscle weakness (generalized) (M62.81);Other abnormalities of gait and mobility (R26.89)   Activity Tolerance Patient tolerated treatment well   Patient Left with call bell/phone within reach;with bed alarm set   Nurse Communication Mobility status        Time: 1250-1310 OT Time Calculation (min): 20 min  Charges: OT  General Charges $OT Visit: 1 Visit OT Treatments $Self Care/Home Management : 8-22 mins  [place], [place]   Betsy Pries 10/14/2017, 5:04 PM

## 2017-10-14 NOTE — Progress Notes (Signed)
Patient is still not wanting to wear her CPAP due to dryed up and sore nose. RT explained to patient to call if she changed her mind.

## 2017-10-14 NOTE — Progress Notes (Signed)
PROGRESS NOTE  BERNARDETTE [name] HKV:[id number] DOB: [birth year] DOA: 10/08/2017 PCP: [name], MD  HPI/Recap of past 24 hours: April [name] is a 47 y.o. female with medical history significant of recent complicated hospital admission with ARDS, influenza, healthcare associated pneumonia requiring intubation tracheostomy at [facility].  Patient was in [place] for 2 months discharged to a rehab facility where she stayed for 2 weeks and then she was discharged home.  Even though she was at the rehab facility she never started walking with a walker.  Currently her main complaints are dyspnea on exertion, shortness of breath, and pleuritic chest pain.  Patient has history of atrial fibrillation and she takes Eliquis for over 3 to 4 years.  She reports that she has not missed any doses.  She also carries a history of congestive heart failure.  She went to her primary care physician, found to have a low blood pressure and she was told to come to the ER.  She is complaining of having cough bringing up yellow phlegm and chills no documented fever at home.  She also complains of having nausea and vomiting for 3 days with no diarrhea, no hematemesis no melena no hematuria. In the ED, CTA chest, neg for PE, CXR questionable for pneumonia.  Patient admitted for further management.  Today, patient reports still having sore-throat, cough, nasal congestion, facial tenderness. Denies any chest pain, SOB. Up-trending leukocytosis.   Assessment/Plan: Active Problems:   Pleuritic chest pain   SOB (shortness of breath)  Acute sinusitis ??viral Afebrile, with leukocytosis trending upwards Nasal congestion, facial tenderness Chest x-ray and CT chest no evidence of pneumonia/PE or any other acute findings.  Evidence of mild bilateral chronic interstitial lung disease, repeat CXR no acute cardiopulmonary findings CT neck/soft tissue unremarkable Respiratory panel negative Strep throat culture negative Procalcitonin  negative Will order blood culture X2, although no fever Continue duonebs, inhalers, mucinex, flonase Started on PO Augmentin, day 3 today. May consider levaquin if still rising Vs ID consult  Pleuritic chest pain Currently resolved Elevated d-dimer EKG showed Afib CTA chest negative for PE  Deconditioned/Hx of prolonged hospital stay/generalized weakness Pt was just discharged from rehab Unsure if she was able to ambulate independently PT/OT rec SNF, awaiting placement  P.Afib Rate controlled, not on any meds at home Continue eliquis  Chronic Diastolic HF Appears euvolemic Continue home lasix  Hypokalemia Replace prn  Morbid obesity Dietician on board    Code Status: Full  Family Communication: None at bedside  Disposition Plan: SNF   Consultants:  None   Procedures:  None  Antimicrobials:  Augmentin  DVT prophylaxis:  Eliquis   Objective: Vitals:   10/14/17 0519 10/14/17 0820 10/14/17 1516 10/14/17 1940  BP: 122/63  131/82   Pulse: 92  91   Resp: 19  18   Temp: 98.3 F (36.8 C)  98.2 F (36.8 C)   TempSrc:   Oral   SpO2: 98% 96% (!) 86% 92%  Weight:      Height:        Intake/Output Summary (Last 24 hours) at 10/14/2017 2044 Last data filed at 10/13/2017 2145 Gross per 24 hour  Intake -  Output 700 ml  Net -700 ml   Filed Weights   10/11/17 1307  Weight: (!) 139.3 kg (307 lb)    Exam:   General: NAD  Cardiovascular: S1, S2 present  Respiratory: CTAB  Abdomen: Soft, non-tender, non-distended, BS present  Musculoskeletal: Trace edema bilaterally  Skin:  Normal  Psychiatry: Normal mood    Data Reviewed: CBC: Recent Labs  Lab 10/10/17 0323 10/11/17 0544 10/12/17 0542 10/13/17 0534 10/14/17 0752  WBC 11.5* 12.5* 15.4* 16.1* 18.6*  NEUTROABS 6.2 6.7 9.4* 10.9* 12.6*  HGB 10.6* 10.4* 10.5* 10.4* 11.1*  HCT 33.6* 33.7* 33.1* 32.6* 34.9*  MCV 83.8 85.5 83.4 82.1 83.5  PLT 398 402* 428* 428* 841*   Basic Metabolic  Panel: Recent Labs  Lab 10/08/17 1244  10/10/17 0323 10/11/17 0544 10/12/17 0542 10/13/17 0534 10/14/17 0752  NA 138   < > 140 140 139 139 137  K 2.7*   < > 2.7* 3.7 3.1* 3.1* 3.3*  CL 102   < > 108 108 104 103 101  CO2 24   < > 21* 25 26 25 25   GLUCOSE 94   < > 89 82 83 81 91  BUN 5*   < > <5* <5* 5* 5* <5*  CREATININE 0.93   < > 0.61 0.60 0.60 0.64 0.66  CALCIUM 8.3*   < > 7.8* 8.2* 8.0* 7.7* 8.0*  MG 1.3*  --   --   --   --  1.1* 1.8   < > = values in this interval not displayed.   GFR: Estimated Creatinine Clearance: 129.3 mL/min (by C-G formula based on SCr of 0.66 mg/dL). Liver Function Tests: No results for input(s): AST, ALT, ALKPHOS, BILITOT, PROT, ALBUMIN in the last 168 hours. No results for input(s): LIPASE, AMYLASE in the last 168 hours. No results for input(s): AMMONIA in the last 168 hours. Coagulation Profile: No results for input(s): INR, PROTIME in the last 168 hours. Cardiac Enzymes: No results for input(s): CKTOTAL, CKMB, CKMBINDEX, TROPONINI in the last 168 hours. BNP (last 3 results) No results for input(s): PROBNP in the last 8760 hours. HbA1C: No results for input(s): HGBA1C in the last 72 hours. CBG: No results for input(s): GLUCAP in the last 168 hours. Lipid Profile: No results for input(s): CHOL, HDL, LDLCALC, TRIG, CHOLHDL, LDLDIRECT in the last 72 hours. Thyroid Function Tests: No results for input(s): TSH, T4TOTAL, FREET4, T3FREE, THYROIDAB in the last 72 hours. Anemia Panel: No results for input(s): VITAMINB12, FOLATE, FERRITIN, TIBC, IRON, RETICCTPCT in the last 72 hours. Urine analysis:    Component Value Date/Time   COLORURINE YELLOW 10/13/2017 1100   APPEARANCEUR CLEAR 10/13/2017 1100   LABSPEC 1.005 10/13/2017 1100   PHURINE 5.0 10/13/2017 1100   GLUCOSEU NEGATIVE 10/13/2017 1100   GLUCOSEU NEG mg/dL 10/28/2007 2049   HGBUR SMALL (A) 10/13/2017 1100   BILIRUBINUR NEGATIVE 10/13/2017 1100   KETONESUR NEGATIVE 10/13/2017 1100    PROTEINUR NEGATIVE 10/13/2017 1100   UROBILINOGEN 1.0 11/21/2014 0707   NITRITE NEGATIVE 10/13/2017 1100   LEUKOCYTESUR SMALL (A) 10/13/2017 1100   Sepsis Labs: @LABRCNTIP (procalcitonin:4,lacticidven:4)  ) Recent Results (from the past 240 hour(s))  MRSA PCR Screening     Status: None   Collection Time: 10/08/17 10:41 PM  Result Value Ref Range Status   MRSA by PCR NEGATIVE NEGATIVE Final    Comment:        The GeneXpert MRSA Assay (FDA approved for NASAL specimens only), is one component of a comprehensive MRSA colonization surveillance program. It is not intended to diagnose MRSA infection nor to guide or monitor treatment for MRSA infections. Performed at [facility], [place] [address] [place], [place] [address]   Respiratory Panel by PCR     Status: None   Collection Time: 10/09/17  6:21  PM  Result Value Ref Range Status   Adenovirus NOT DETECTED NOT DETECTED Final   Coronavirus 229E NOT DETECTED NOT DETECTED Final   Coronavirus HKU1 NOT DETECTED NOT DETECTED Final   Coronavirus NL63 NOT DETECTED NOT DETECTED Final   Coronavirus OC43 NOT DETECTED NOT DETECTED Final   Metapneumovirus NOT DETECTED NOT DETECTED Final   Rhinovirus / Enterovirus NOT DETECTED NOT DETECTED Final   Influenza A NOT DETECTED NOT DETECTED Final   Influenza B NOT DETECTED NOT DETECTED Final   Parainfluenza Virus 1 NOT DETECTED NOT DETECTED Final   Parainfluenza Virus 2 NOT DETECTED NOT DETECTED Final   Parainfluenza Virus 3 NOT DETECTED NOT DETECTED Final   Parainfluenza Virus 4 NOT DETECTED NOT DETECTED Final   Respiratory Syncytial Virus NOT DETECTED NOT DETECTED Final   Bordetella pertussis NOT DETECTED NOT DETECTED Final   Chlamydophila pneumoniae NOT DETECTED NOT DETECTED Final   Mycoplasma pneumoniae NOT DETECTED NOT DETECTED Final    Comment: Performed at [facility], [place] [address], [place], [place] [address]  Urine Culture     Status: Abnormal   Collection  Time: 10/09/17  7:55 PM  Result Value Ref Range Status   Specimen Description   Final    URINE, RANDOM Performed at [place] [address], [place], [place] [address]    Special Requests   Final    NONE Performed at [facility], [place] [address], [place], [place] [address]    Culture MULTIPLE SPECIES PRESENT, SUGGEST RECOLLECTION (A)  Final   Report Status 10/11/2017 FINAL  Final  Group A Strep by PCR     Status: Abnormal   Collection Time: 10/12/17  8:20 AM  Result Value Ref Range Status   Group A Strep by PCR NONE DETECTED (A) NOT DETECTED Final    Comment: Performed at [facility], [place] [address], [place], [place] [address]  Urine Culture     Status: Abnormal   Collection Time: 10/13/17 11:00 AM  Result Value Ref Range Status   Specimen Description   Final    URINE, CLEAN CATCH Performed at [facility], [place] [address] [place], [place] [address]    Special Requests   Final    NONE Performed at [facility], [place] [address], [place], [place] [address]    Culture MULTIPLE SPECIES PRESENT, SUGGEST RECOLLECTION (A)  Final   Report Status 10/14/2017 FINAL  Final  Culture, blood (routine x 2)     Status: None (Preliminary result)   Collection Time: 10/14/17  3:02 PM  Result Value Ref Range Status   Specimen Description   Final    BLOOD RIGHT HAND Performed at [place] [address], [place], [place] [address]    Special Requests   Final    BOTTLES DRAWN AEROBIC ONLY Blood Culture results may not be optimal due to an inadequate volume of blood received in culture bottles Performed at [place] [address] [place], [place] [address]    Culture PENDING  Incomplete   Report Status PENDING  Incomplete  Culture, blood (routine x 2)     Status: None (Preliminary result)   Collection Time: 10/14/17  3:11 PM  Result Value Ref Range Status    Specimen Description   Final    BLOOD LEFT HAND Performed at [place] [address], [place], [place] [address]    Special Requests  Final    BOTTLES DRAWN AEROBIC ONLY Blood Culture results may not be optimal due to an inadequate volume of blood received in culture bottles Performed at [place] [address] [place], [place] [address]    Culture PENDING  Incomplete   Report Status PENDING  Incomplete      Studies: Dg Chest Port 1 View  Result Date: 10/14/2017 CLINICAL DATA:  Leukocytosis. EXAM: PORTABLE CHEST 1 VIEW COMPARISON:  Radiograph October 11, 2017. FINDINGS: The heart size and mediastinal contours are within normal limits. No pneumothorax or pleural effusion is noted. Lungs are clear. The visualized skeletal structures are unremarkable. IMPRESSION: No acute cardiopulmonary abnormality seen. Electronically Signed   By: Marijo Conception, M.D.   On: 10/14/2017 13:16    Scheduled Meds: . amoxicillin-clavulanate  1 tablet Oral Q12H  . apixaban  5 mg Oral BID  . dextromethorphan-guaiFENesin  1 tablet Oral BID  . fluticasone  1 spray Each Nare Daily  . furosemide  40 mg Oral Daily  . ipratropium-albuterol  3 mL Nebulization BID  . mometasone-formoterol  2 puff Inhalation BID  . montelukast  10 mg Oral QHS  . multivitamin with minerals  1 tablet Oral Daily  . nystatin  5 mL Oral QID  . pantoprazole  40 mg Oral Daily  . sertraline  100 mg Oral Daily  . zolpidem  5 mg Oral QHS    Continuous Infusions:    LOS: 6 days     Alma Friendly, MD [facility]   If 7PM-7AM, please contact night-coverage [email or web address] Password [facility] 10/14/2017, 8:44 PM

## 2017-10-14 NOTE — Progress Notes (Addendum)
CSW discussed with Market researcher and CSW Surveyor, quantity the patient discharge barriers to SNF placement for rehab. CSW Surveyor, quantity has agreed to do a letter of Guarantee for rehab placement at [facility]. CSW informed physician about discharge plan. Physician reports the patient is not medically ready for transfer today.  CSW will continue to assist with discharge plan.    Kathrin Greathouse, Marlinda Mike, MSW Clinical Social Worker  [phone] 10/14/2017  1:07 PM

## 2017-10-15 ENCOUNTER — Inpatient Hospital Stay: Payer: Medicaid Other | Admitting: Critical Care Medicine

## 2017-10-15 DIAGNOSIS — R531 Weakness: Secondary | ICD-10-CM

## 2017-10-15 DIAGNOSIS — J019 Acute sinusitis, unspecified: Secondary | ICD-10-CM

## 2017-10-15 DIAGNOSIS — R0602 Shortness of breath: Secondary | ICD-10-CM

## 2017-10-15 DIAGNOSIS — R0781 Pleurodynia: Principal | ICD-10-CM

## 2017-10-15 DIAGNOSIS — R5381 Other malaise: Secondary | ICD-10-CM

## 2017-10-15 LAB — BASIC METABOLIC PANEL
ANION GAP: 9 (ref 5–15)
BUN: <5 mg/dL — ABNORMAL LOW (ref 6–20)
CHLORIDE: 102 mmol/L (ref 101–111)
CO2: 26 mmol/L (ref 22–32)
Calcium: 8.2 mg/dL — ABNORMAL LOW (ref 8.9–10.3)
Creatinine, Ser: 0.67 mg/dL (ref 0.44–1.00)
Glucose, Bld: 87 mg/dL (ref 65–99)
POTASSIUM: 3.2 mmol/L — AB (ref 3.5–5.1)
SODIUM: 137 mmol/L (ref 135–145)

## 2017-10-15 LAB — CBC WITH DIFFERENTIAL/PLATELET
BASOS ABS: 0 10*3/uL (ref 0.0–0.1)
BASOS PCT: 0 %
EOS ABS: 0.3 10*3/uL (ref 0.0–0.7)
Eosinophils Relative: 2 %
HCT: 33.5 % — ABNORMAL LOW (ref 36.0–46.0)
Hemoglobin: 10.7 g/dL — ABNORMAL LOW (ref 12.0–15.0)
LYMPHS ABS: 4.4 10*3/uL — AB (ref 0.7–4.0)
Lymphocytes Relative: 25 %
MCH: 26.6 pg (ref 26.0–34.0)
MCHC: 31.9 g/dL (ref 30.0–36.0)
MCV: 83.1 fL (ref 78.0–100.0)
Monocytes Absolute: 1.3 10*3/uL — ABNORMAL HIGH (ref 0.1–1.0)
Monocytes Relative: 8 %
Neutro Abs: 11.2 10*3/uL — ABNORMAL HIGH (ref 1.7–7.7)
Neutrophils Relative %: 65 %
Platelets: 451 10*3/uL — ABNORMAL HIGH (ref 150–400)
RBC: 4.03 MIL/uL (ref 3.87–5.11)
RDW: 16.1 % — ABNORMAL HIGH (ref 11.5–15.5)
WBC: 17.2 10*3/uL — AB (ref 4.0–10.5)

## 2017-10-15 LAB — URINE CULTURE: CULTURE: NO GROWTH

## 2017-10-15 LAB — HEPATIC FUNCTION PANEL
ALK PHOS: 88 U/L (ref 38–126)
ALT: 38 U/L (ref 14–54)
AST: 39 U/L (ref 15–41)
Albumin: 2.3 g/dL — ABNORMAL LOW (ref 3.5–5.0)
BILIRUBIN INDIRECT: 0.4 mg/dL (ref 0.3–0.9)
BILIRUBIN TOTAL: 0.5 mg/dL (ref 0.3–1.2)
Bilirubin, Direct: 0.1 mg/dL (ref 0.1–0.5)
Total Protein: 6.1 g/dL — ABNORMAL LOW (ref 6.5–8.1)

## 2017-10-15 LAB — MAGNESIUM: Magnesium: 1.7 mg/dL (ref 1.7–2.4)

## 2017-10-15 LAB — PHOSPHORUS: Phosphorus: 5.2 mg/dL — ABNORMAL HIGH (ref 2.5–4.6)

## 2017-10-15 MED ORDER — IPRATROPIUM-ALBUTEROL 0.5-2.5 (3) MG/3ML IN SOLN: 3.0000 mL | Freq: Two times a day (BID) | RESPIRATORY_TRACT | Status: DC

## 2017-10-15 MED ORDER — IPRATROPIUM-ALBUTEROL 0.5-2.5 (3) MG/3ML IN SOLN: 3.0000 mL | RESPIRATORY_TRACT | Status: DC | PRN

## 2017-10-15 MED ORDER — GUAIFENESIN ER 600 MG PO TB12: 1200.0000 mg | ORAL_TABLET | Freq: Two times a day (BID) | ORAL | Status: DC

## 2017-10-15 MED ORDER — FLUTICASONE PROPIONATE 50 MCG/ACT NA SUSP: 1.0000 | Freq: Every day | NASAL | 2 refills | Status: DC

## 2017-10-15 MED ORDER — POTASSIUM CHLORIDE CRYS ER 20 MEQ PO TBCR
40.0000 meq | EXTENDED_RELEASE_TABLET | Freq: Two times a day (BID) | ORAL | Status: DC
  Administered 2017-10-15: 40 meq via ORAL
  Filled 2017-10-15: qty 2

## 2017-10-15 MED ORDER — AMOXICILLIN-POT CLAVULANATE 875-125 MG PO TABS: 1.0000 | ORAL_TABLET | Freq: Two times a day (BID) | ORAL | 0 refills | Status: AC

## 2017-10-15 MED ORDER — SALINE SPRAY 0.65 % NA SOLN: 1.0000 | NASAL | 0 refills | Status: DC | PRN

## 2017-10-15 MED ORDER — MENTHOL 3 MG MT LOZG: 1.0000 | LOZENGE | OROMUCOSAL | 12 refills | Status: DC | PRN

## 2017-10-15 MED ORDER — ADULT MULTIVITAMIN W/MINERALS CH: 1.0000 | ORAL_TABLET | Freq: Every day | ORAL | Status: DC

## 2017-10-15 MED ORDER — GUAIFENESIN ER 600 MG PO TB12
1200.0000 mg | ORAL_TABLET | Freq: Two times a day (BID) | ORAL | Status: DC
  Administered 2017-10-15: 1200 mg via ORAL
  Filled 2017-10-15: qty 2

## 2017-10-15 NOTE — Clinical Social Work Placement (Addendum)
D/C summary sent [facility] arrangd for transport.  Nurse given number to call report.    CLINICAL SOCIAL WORK PLACEMENT  NOTE  Date:  10/15/2017  Patient Details  Name: April [name] MRN: [id number] Date of Birth: [birth year]  Clinical Social Work is seeking post-discharge placement for this patient at the [place] level of care (*CSW will initial, date and re-position this form in  chart as items are completed):  Yes   Patient/family provided with [facility] Work Department's list of facilities offering this level of care within the geographic area requested by the patient (or if unable, by the patient's family).  Yes   Patient/family informed of their freedom to choose among providers that offer the needed level of care, that participate in Medicare, Medicaid or managed care program needed by the patient, have an available bed and are willing to accept the patient.  Yes   Patient/family informed of [facility]'s ownership interest in [facility] and [facility], as well as of the fact that they are under no obligation to receive care at these facilities.  PASRR submitted to [facility] on 10/12/17     PASRR number received on 10/12/17     Existing PASRR number confirmed on       FL2 transmitted to all facilities in geographic area requested by pt/family on 10/12/17     FL2 transmitted to all facilities within larger geographic area on       Patient informed that his/her managed care company has contracts with or will negotiate with certain facilities, including the following:            Patient/family informed of bed offers received.  Patient chooses bed at     [facility]  Physician recommends and patient chooses bed at      Patient to be transferred to [facility]  on 10/15/17.  Patient to be transferred to facility by [facility]      Patient family notified on 10/15/17 of transfer.  Name of family member notified:    Patient notified her  sister and mother.   PHYSICIAN Please sign FL2     Additional Comment:    _______________________________________________ Lia Hopping, LCSW 10/15/2017, 12:24 PM

## 2017-10-15 NOTE — Progress Notes (Signed)
Report called to [facility] at [facility].  All questions answered.

## 2017-10-15 NOTE — Discharge Summary (Signed)
Physician Discharge Summary  April [name] WFU:[id number] DOB: 27-May-1970 DOA: 10/08/2017  PCP: [name], MD  Admit date: 10/08/2017 Discharge date: 10/15/2017  Admitted From: Home Disposition: SNF  Recommendations for Outpatient Follow-up:  1. Follow up with PCP in 1-2 weeks 2. Follow up with Pulmonary Dr. Joya Gaskins within 1-2 weeks 3. Repeat Chest X-Ray in 3 to 6 weeks 4. Please obtain CMP/CBC, Mag, Phos in one week 5. Please follow up on the following pending results:  Home Health: No Equipment/Devices: None   Discharge Condition: Stable  CODE STATUS: FULL CODE Diet recommendation:   Brief/Interim Summary: April [name] is a 47 y.o. female with medical history significant of recent complicated hospital admission with ARDS, influenza, healthcare associated pneumonia requiring intubation tracheostomy and ECMO at [facility].  Patient was in the [place] hospital for 2 months discharged to a rehab facility where she stayed for 2 weeks and then she was discharged home.  Even though she was at the rehab facility she never started walking with a walker.  Currently her main complaints are dyspnea on exertion, shortness of breath, and pleuritic chest pain.  Patient has history of atrial fibrillation and she takes Eliquis for over 3 to 4 years.  She reports that she has not missed any doses.  She also carries a history of congestive heart failure.  She went to her Pulmonologist  today found to have a low blood pressure and she was told to come to the ER.  She is complaining of having cough bringing up yellow phlegm and chills no documented fever at home.  She also complains of having nausea and vomiting for 3 days with no diarrhea, no hematemesis no melena no hematuria or other urinary complaints. CXR and CTA Chest Negative but did show findings suspicious for ILD. She was treated for an Acute Sinusitis and has been improving. CT Soft Tissue Neck Negative.  During hospitalization her white blood cell count  started trending upwards however now trending downwards after she was started on Augmentin for her sinusitis. She continues to complain of Nasal Congestion but feels better. PT/OT evaluated and recommending skilled nursing facility.  Patient was deemed medically stable at this time to be discharged and will need to follow-up with PCP as well as follow-up with Pulmonology in the outpatient setting  Discharge Diagnoses:  Active Problems:   Pleuritic chest pain   SOB (shortness of breath)  Acute sinusitis, and suspect Viral Etiology -Afebrile but Leukocytosis trending upwards from 11.5 up to 18.6; Currently now improved and went from 18.6 -> 17.2  -Nasal congestion, facial tenderness -Chest x-ray and CT chest no evidence of pneumonia/PE or any other acute findings.  Evidence of mild bilateral chronic interstitial lung disease, repeat CXR no acute cardiopulmonary findings -CT neck/soft tissue unremarkable -Respiratory panel negative -Strep throat culture negative -Procalcitonin negative at <0.10 x2 -Blood Cx x2 ordered and pending but no source of infection Identified  -Continue duonebs, inhalers, mucinex, flonase at D/C -Started on PO Augmentin and today is Day 4 and continue for 7 day course -Discussed with ID Dr. Michel Bickers who recommended no further investigation of WBC as trending down now and felt could be reactive -Follow-up with PCP and repeat CBC at skilled nursing facility.  Pleuritic Chest Pain -Currently resolved -Elevated d-dimer -EKG showed Atrial Fibrillation  -CTA chest negative for PE  Deconditioned/Hx of prolonged hospital stay/generalized Weakness -Pt was just discharged from rehab -Unsure if she was able to ambulate independently -PT/OT rec SNF, and social worker  consulted for assistance with placement   Paroxysmal Atrial Fibrillation -Rate controlled, not on any meds at home -Continue apixaban 5 mg p.o. twice daily  Chronic Diastolic HF -Appears euvolemic  and currently not Decompensated -Continue home Lasix with 40 mg Lasix p.o. Daily -Strict ins and outs as well as daily weights -Recent is -141 mL since admission -Continue to monitor volume status very carefully  Hypokalemia -Patient's potassium this morning was 3.2 -Replete with potassium chloride 40 mEq p.o. twice daily x2 doses -Continue to monitor and replete as necessary -Repeat CMP at skilled nursing facility.  Morbid Obesity -BMI was 49.55 kg/m -Dietician on board -Weight loss counseling given  Hyperphosphatemia -Mild at 5.2 -Continue to monitor repeat phosphorus level in outpatient setting  Hypomagnesemia -Patient's Magnesium was 1.1 a few days ago and -Now replete and improved to 1.7 -Continue to monitor replete as necessary -Repeat magnesium level in outpatient setting  History of ARDS from COPD and Influenza with subsequent Respiratory Failure status post ECMO at [facility] -Currently resolved  Discharge Instructions Discharge Instructions    Call MD for:  difficulty breathing, headache or visual disturbances   Complete by:  As directed    Call MD for:  extreme fatigue   Complete by:  As directed    Call MD for:  hives   Complete by:  As directed    Call MD for:  persistant dizziness or light-headedness   Complete by:  As directed    Call MD for:  persistant nausea and vomiting   Complete by:  As directed    Call MD for:  redness, tenderness, or signs of infection (pain, swelling, redness, odor or green/yellow discharge around incision site)   Complete by:  As directed    Call MD for:  severe uncontrolled pain   Complete by:  As directed    Call MD for:  temperature >100.4   Complete by:  As directed    Diet - low sodium heart healthy   Complete by:  As directed    Discharge instructions   Complete by:  As directed    Follow up with PCP and Pulmonary in the outpatient setting.  Take All medications as prescribed.  If symptoms  change or worsen please return to emergency room for evaluation.   Increase activity slowly   Complete by:  As directed      Allergies as of 10/15/2017      Reactions   Contrast Media [iodinated Diagnostic Agents] Itching   Ct contrast   Oxycodone-acetaminophen Itching      Medication List    STOP taking these medications   albuterol (2.5 MG/3ML) 0.083% nebulizer solution Commonly known as:  PROVENTIL   amLODipine 5 MG tablet Commonly known as:  NORVASC   ATROVENT HFA 17 MCG/ACT inhaler Generic drug:  ipratropium   heparin 5000 UNIT/ML injection   hydrALAZINE 20 MG/ML injection Commonly known as:  APRESOLINE   insulin aspart 100 UNIT/ML injection Commonly known as:  novoLOG   magnesium oxide 400 MG tablet Commonly known as:  MAG-OX   metoprolol tartrate 5 MG/5ML Soln injection Commonly known as:  LOPRESSOR   midazolam 50 mg in sodium chloride 0.9 % 40 mL   ondansetron 4 MG tablet Commonly known as:  ZOFRAN   PROAIR HFA 108 (90 Base) MCG/ACT inhaler Generic drug:  albuterol   traZODone 100 MG tablet Commonly known as:  DESYREL     TAKE these medications   amoxicillin-clavulanate 875-125 MG tablet  Commonly known as:  AUGMENTIN Take 1 tablet by mouth every 12 (twelve) hours for 3 days.   diclofenac sodium 1 % Gel Commonly known as:  VOLTAREN Apply 1 application topically 4 (four) times daily.   ELIQUIS 5 MG Tabs tablet Generic drug:  apixaban Take 5 mg by mouth 2 (two) times daily.   fluticasone 50 MCG/ACT nasal spray Commonly known as:  FLONASE Place 1 spray into both nostrils daily. Start taking on:  10/16/2017   furosemide 40 MG tablet Commonly known as:  LASIX Take 40 mg by mouth daily.   guaiFENesin 600 MG 12 hr tablet Commonly known as:  MUCINEX Take 2 tablets (1,200 mg total) by mouth 2 (two) times daily. What changed:    how much to take  when to take this  reasons to take this   ipratropium-albuterol 0.5-2.5 (3) MG/3ML  Soln Commonly known as:  DUONEB Take 3 mLs by nebulization every 2 (two) hours as needed. What changed:    when to take this  reasons to take this   ipratropium-albuterol 0.5-2.5 (3) MG/3ML Soln Commonly known as:  DUONEB Take 3 mLs by nebulization 2 (two) times daily. What changed:  You were already taking a medication with the same name, and this prescription was added. Make sure you understand how and when to take each.   menthol-cetylpyridinium 3 MG lozenge Commonly known as:  CEPACOL Take 1 lozenge (3 mg total) by mouth as needed for sore throat.   montelukast 10 MG tablet Commonly known as:  SINGULAIR Take 10 mg by mouth at bedtime.   multivitamin with minerals Tabs tablet Take 1 tablet by mouth daily. Start taking on:  10/16/2017   pantoprazole 40 MG tablet Commonly known as:  PROTONIX Take 40 mg by mouth daily.   Potassium Chloride ER 20 MEQ Tbcr Take 20 mEq by mouth daily.   sertraline 100 MG tablet Commonly known as:  ZOLOFT Take 100 mg by mouth daily.   sodium chloride 0.65 % Soln nasal spray Commonly known as:  OCEAN Place 1 spray into both nostrils as needed for congestion.   SYMBICORT 160-4.5 MCG/ACT inhaler Generic drug:  budesonide-formoterol Inhale 2 puffs into the lungs 2 (two) times daily.       Allergies  Allergen Reactions  . Contrast Media [Iodinated Diagnostic Agents] Itching    Ct contrast  . Oxycodone-Acetaminophen Itching    Consultations:  Discussed Case with ID Dr. Michel Bickers  Procedures/Studies: Dg Chest 2 View  Result Date: 10/08/2017 CLINICAL DATA:  Chest pain with cough and vomiting. EXAM: CHEST - 2 VIEW COMPARISON:  08/18/2017. FINDINGS: Cardiomegaly. Retrocardiac density, noted on the April radiograph, appears to persist although is less prominent. The diffuse airspace opacity noted previously is significantly improved. No bony abnormality. IMPRESSION: Cardiomegaly with overall improving aeration. There could be  residual LEFT lower lobe infiltrate. Electronically Signed   By: Staci Righter M.D.   On: 10/08/2017 13:08   Ct Soft Tissue Neck Wo Contrast  Result Date: 10/13/2017 CLINICAL DATA:  Sore throat.  Contrast allergy. EXAM: CT NECK WITHOUT CONTRAST TECHNIQUE: Multidetector CT imaging of the neck was performed following the standard protocol without intravenous contrast. COMPARISON:  None. FINDINGS: The patient was unable to remain motionless for the exam. Small or subtle lesions could be overlooked. Pharynx and larynx: Normal. No mass or swelling. Salivary glands: No inflammation, mass or stone. Thyroid: Normal. Lymph nodes: None enlarged or abnormal density. Vascular:   Suspected mild atherosclerosis. Limited intracranial: Negative. Visualized  orbits: Negative. Mastoids and visualized paranasal sinuses: Negative. Skeleton: Spondylosis.  Largely edentulous. Upper chest: Respiratory degraded.  No visible pneumothorax. Other: None. IMPRESSION: Noncontrast CT of the neck demonstrates no mass or airway compromise. Electronically Signed   By: Staci Righter M.D.   On: 10/13/2017 12:26   Ct Chest Wo Contrast  Result Date: 10/08/2017 CLINICAL DATA:  Worsening chest pain for 2 months EXAM: CT CHEST WITHOUT CONTRAST TECHNIQUE: Multidetector CT imaging of the chest was performed following the standard protocol without IV contrast. COMPARISON:  None. FINDINGS: Cardiovascular: No significant vascular findings. Normal heart size. No pericardial effusion. Mediastinum/Nodes: No enlarged mediastinal or axillary lymph nodes. Thyroid gland, trachea, and esophagus demonstrate no significant findings. Lungs/Pleura: No focal consolidation. Chronic bilateral upper lobe scarring with mild bronchiectasis. Mild lingular, right middle lobe and right lower lobe scarring. No pleural effusion or pneumothorax. Upper Abdomen: No acute upper abdominal abnormality. Musculoskeletal: No acute osseous abnormality. No aggressive osseous lesion.  IMPRESSION: 1. No acute cardiopulmonary disease. 2. Mild bilateral chronic interstitial lung disease. Electronically Signed   By: Kathreen Devoid   On: 10/08/2017 14:44   Ct Angio Chest Pe W Or Wo Contrast  Result Date: 10/08/2017 CLINICAL DATA:  PE suspected, intermediate prob, positive D-dimer EXAM: CT ANGIOGRAPHY CHEST WITH CONTRAST TECHNIQUE: Multidetector CT imaging of the chest was performed using the standard protocol during bolus administration of intravenous contrast. Multiplanar CT image reconstructions and MIPs were obtained to evaluate the vascular anatomy. CONTRAST:  73mL ISOVUE-370 IOPAMIDOL (ISOVUE-370) INJECTION 76%. Patient pre-medicated prior to the exam due to contrast allergy. No reactions reported. COMPARISON:  Noncontrast CT 7 hours prior. FINDINGS: Cardiovascular: There are no filling defects within the central pulmonary arteries to the segmental level to suggest pulmonary embolus. Subsegmental branches cannot be assessed due to contrast bolus timing, soft tissue attenuation from habitus, and breathing motion artifact. Heart size upper normal. Normal caliber thoracic aorta without dissection. Left vertebral artery arises directly from the aorta, a normal variant. No pericardial effusion. Mediastinum/Nodes: No enlarged mediastinal or hilar lymph nodes. Esophagus is decompressed. Visualized thyroid gland is normal. Lung parenchyma better assessed on CT earlier this day given degree of motion on the current exam. Lungs/Pleura: No confluent consolidation. Subpleural reticulation in the upper lobes with bronchial thickening and mild upper lobe bronchiectasis, better assessed on prior exam. A few pulmonary cysts noted in the lower lobes. Perihilar upper lobe opacities may represent scarring. No evidence of pulmonary edema. No pleural effusion. Trachea and mainstem bronchi are grossly patent allowing for degree motion. Upper Abdomen: Suspected hepatic steatosis.  No acute findings. Musculoskeletal:  There are no acute or suspicious osseous abnormalities. Review of the MIP images confirms the above findings. IMPRESSION: 1. No pulmonary embolus to the segmental level. 2. Lung parenchymal better assessed on noncontrast CT earlier this day given degree of motion on the current exam. Findings suspicious for interstitial lung disease again seen. Electronically Signed   By: Jeb Levering M.D.   On: 10/08/2017 22:18   Dg Chest Port 1 View  Result Date: 10/14/2017 CLINICAL DATA:  Leukocytosis. EXAM: PORTABLE CHEST 1 VIEW COMPARISON:  Radiograph October 11, 2017. FINDINGS: The heart size and mediastinal contours are within normal limits. No pneumothorax or pleural effusion is noted. Lungs are clear. The visualized skeletal structures are unremarkable. IMPRESSION: No acute cardiopulmonary abnormality seen. Electronically Signed   By: Marijo Conception, M.D.   On: 10/14/2017 13:16   Dg Chest Port 1 View  Result Date: 10/11/2017 CLINICAL DATA:  Shortness of breath, cough and congestion for 4 days. Central chest pain. EXAM: PORTABLE CHEST 1 VIEW COMPARISON:  CT chest and chest radiograph 10/08/2017. FINDINGS: Trachea is midline. Heart size stable. Very mild interstitial prominence. No definite airspace consolidation or pleural fluid on this apical lordotic view. IMPRESSION: 1. No definite acute findings. 2. Mild interstitial prominence, as on 10/08/2017. Electronically Signed   By: Lorin Picket M.D.   On: 10/11/2017 08:42    Subjective: Seen and examined at bedside and was feeling okay.  Still complaining of some nasal congestion and a slight sore throat but states that she is improved.  No lightheadedness or dizziness.  No other complaints or concerns at this time.  Discharge Exam: Vitals:   10/15/17 0612 10/15/17 0831  BP: 121/70   Pulse: (!) 109   Resp: 18   Temp: 97.8 F (36.6 C)   SpO2: 95% (!) 7%   Vitals:   10/14/17 1940 10/14/17 2202 10/15/17 0612 10/15/17 0831  BP:  (!) 141/74 121/70    Pulse:  (!) 109 (!) 109   Resp:  18 18   Temp:  98.8 F (37.1 C) 97.8 F (36.6 C)   TempSrc:   Oral   SpO2: 92% 98% 95% (!) 7%  Weight:      Height:       General: Pt is alert, awake, not in acute distress Cardiovascular: Irregularly Irregular and slightly tachycardic, S1/S2 +, no rubs, no gallops Respiratory: Diminished bilaterally, no wheezing, no rhonchi Abdominal: Soft, NT, Distended due to body habitus, bowel sounds + Extremities: no LE edema, no cyanosis  The results of significant diagnostics from this hospitalization (including imaging, microbiology, ancillary and laboratory) are listed below for reference.    Microbiology: Recent Results (from the past 240 hour(s))  MRSA PCR Screening     Status: None   Collection Time: 10/08/17 10:41 PM  Result Value Ref Range Status   MRSA by PCR NEGATIVE NEGATIVE Final    Comment:        The GeneXpert MRSA Assay (FDA approved for NASAL specimens only), is one component of a comprehensive MRSA colonization surveillance program. It is not intended to diagnose MRSA infection nor to guide or monitor treatment for MRSA infections. Performed at [facility], [place] [address] [place], [place] [address]   Respiratory Panel by PCR     Status: None   Collection Time: 10/09/17  6:21 PM  Result Value Ref Range Status   Adenovirus NOT DETECTED NOT DETECTED Final   Coronavirus 229E NOT DETECTED NOT DETECTED Final   Coronavirus HKU1 NOT DETECTED NOT DETECTED Final   Coronavirus NL63 NOT DETECTED NOT DETECTED Final   Coronavirus OC43 NOT DETECTED NOT DETECTED Final   Metapneumovirus NOT DETECTED NOT DETECTED Final   Rhinovirus / Enterovirus NOT DETECTED NOT DETECTED Final   Influenza A NOT DETECTED NOT DETECTED Final   Influenza B NOT DETECTED NOT DETECTED Final   Parainfluenza Virus 1 NOT DETECTED NOT DETECTED Final   Parainfluenza Virus 2 NOT DETECTED NOT DETECTED Final   Parainfluenza Virus 3 NOT DETECTED NOT  DETECTED Final   Parainfluenza Virus 4 NOT DETECTED NOT DETECTED Final   Respiratory Syncytial Virus NOT DETECTED NOT DETECTED Final   Bordetella pertussis NOT DETECTED NOT DETECTED Final   Chlamydophila pneumoniae NOT DETECTED NOT DETECTED Final   Mycoplasma pneumoniae NOT DETECTED NOT DETECTED Final    Comment: Performed at [facility], [place] [address] [place], [place] [address]  Urine  Culture     Status: Abnormal   Collection Time: 10/09/17  7:55 PM  Result Value Ref Range Status   Specimen Description   Final    URINE, RANDOM Performed at [place] [address], [place], [place] [address]    Special Requests   Final    NONE Performed at [facility], [place] [address], [place], [place] [address]    Culture MULTIPLE SPECIES PRESENT, SUGGEST RECOLLECTION (A)  Final   Report Status 10/11/2017 FINAL  Final  Group A Strep by PCR     Status: Abnormal   Collection Time: 10/12/17  8:20 AM  Result Value Ref Range Status   Group A Strep by PCR NONE DETECTED (A) NOT DETECTED Final    Comment: Performed at [facility], [place] [address], [place], [place] [address]  Urine Culture     Status: Abnormal   Collection Time: 10/13/17 11:00 AM  Result Value Ref Range Status   Specimen Description   Final    URINE, CLEAN CATCH Performed at [facility], [place] [address] [place], [place] [address]    Special Requests   Final    NONE Performed at [facility], [place] [address] [place], [place] [address]    Culture MULTIPLE SPECIES PRESENT, SUGGEST RECOLLECTION (A)  Final   Report Status 10/14/2017 FINAL  Final  Culture, blood (routine x 2)     Status: None (Preliminary result)   Collection Time: 10/14/17  3:02 PM  Result Value Ref Range Status   Specimen Description   Final    BLOOD RIGHT HAND Performed at [place] [address], [place], [place] [address]    Special  Requests   Final    BOTTLES DRAWN AEROBIC ONLY Blood Culture results may not be optimal due to an inadequate volume of blood received in culture bottles Performed at [place] [address], [place], [place] [address]    Culture PENDING  Incomplete   Report Status PENDING  Incomplete  Culture, blood (routine x 2)     Status: None (Preliminary result)   Collection Time: 10/14/17  3:11 PM  Result Value Ref Range Status   Specimen Description   Final    BLOOD LEFT HAND Performed at [place] [address], [place], [place] [address]    Special Requests   Final    BOTTLES DRAWN AEROBIC ONLY Blood Culture results may not be optimal due to an inadequate volume of blood received in culture bottles Performed at [place] [address], [place], [place] [address]    Culture PENDING  Incomplete   Report Status PENDING  Incomplete    Labs: BNP (last 3 results) Recent Labs    08/11/17 0702 08/14/17 2020 10/08/17 1244  BNP 73.5 17.2 42.3   Basic Metabolic Panel: Recent Labs  Lab 10/08/17 1244  10/11/17 0544 10/12/17 0542 10/13/17 0534 10/14/17 0752 10/15/17 0828  NA 138   < > 140 139 139 137 137  K 2.7*   < > 3.7 3.1* 3.1* 3.3* 3.2*  CL 102   < > 108 104 103 101 102  CO2 24   < > 25 26 25 25 26   GLUCOSE 94   < > 82 83 81 91 87  BUN 5*   < > <5* 5* 5* <5* <5*  CREATININE 0.93   < > 0.60 0.60 0.64 0.66 0.67  CALCIUM 8.3*   < > 8.2* 8.0* 7.7* 8.0* 8.2*  MG 1.3*  --   --   --  1.1* 1.8 1.7  PHOS  --   --   --   --   --   --  5.2*   < > = values in this interval not displayed.   Liver Function Tests: Recent Labs  Lab 10/15/17 0828  AST 39  ALT 38  ALKPHOS 88  BILITOT 0.5  PROT 6.1*  ALBUMIN 2.3*   No results for input(s): LIPASE, AMYLASE in the last 168 hours. No results for input(s): AMMONIA in the last 168 hours. CBC: Recent Labs  Lab 10/11/17 0544 10/12/17 0542 10/13/17 0534 10/14/17 0752 10/15/17 0828  WBC 12.5*  15.4* 16.1* 18.6* 17.2*  NEUTROABS 6.7 9.4* 10.9* 12.6* 11.2*  HGB 10.4* 10.5* 10.4* 11.1* 10.7*  HCT 33.7* 33.1* 32.6* 34.9* 33.5*  MCV 85.5 83.4 82.1 83.5 83.1  PLT 402* 428* 428* 439* 451*   Cardiac Enzymes: No results for input(s): CKTOTAL, CKMB, CKMBINDEX, TROPONINI in the last 168 hours. BNP: Invalid input(s): POCBNP CBG: No results for input(s): GLUCAP in the last 168 hours. D-Dimer No results for input(s): DDIMER in the last 72 hours. Hgb A1c No results for input(s): HGBA1C in the last 72 hours. Lipid Profile No results for input(s): CHOL, HDL, LDLCALC, TRIG, CHOLHDL, LDLDIRECT in the last 72 hours. Thyroid function studies No results for input(s): TSH, T4TOTAL, T3FREE, THYROIDAB in the last 72 hours.  Invalid input(s): FREET3 Anemia work up No results for input(s): VITAMINB12, FOLATE, FERRITIN, TIBC, IRON, RETICCTPCT in the last 72 hours. Urinalysis    Component Value Date/Time   COLORURINE YELLOW 10/13/2017 1100   APPEARANCEUR CLEAR 10/13/2017 1100   LABSPEC 1.005 10/13/2017 1100   PHURINE 5.0 10/13/2017 1100   GLUCOSEU NEGATIVE 10/13/2017 1100   GLUCOSEU NEG mg/dL 10/28/2007 2049   HGBUR SMALL (A) 10/13/2017 1100   BILIRUBINUR NEGATIVE 10/13/2017 1100   KETONESUR NEGATIVE 10/13/2017 1100   PROTEINUR NEGATIVE 10/13/2017 1100   UROBILINOGEN 1.0 11/21/2014 0707   NITRITE NEGATIVE 10/13/2017 1100   LEUKOCYTESUR SMALL (A) 10/13/2017 1100   Sepsis Labs Invalid input(s): PROCALCITONIN,  WBC,  LACTICIDVEN Microbiology Recent Results (from the past 240 hour(s))  MRSA PCR Screening     Status: None   Collection Time: 10/08/17 10:41 PM  Result Value Ref Range Status   MRSA by PCR NEGATIVE NEGATIVE Final    Comment:        The GeneXpert MRSA Assay (FDA approved for NASAL specimens only), is one component of a comprehensive MRSA colonization surveillance program. It is not intended to diagnose MRSA infection nor to guide or monitor treatment for MRSA  infections. Performed at [facility], [place] [address], [place],  [address]   Respiratory Panel by PCR     Status: None   Collection Time: 10/09/17  6:21 PM  Result Value Ref Range Status   Adenovirus NOT DETECTED NOT DETECTED Final   Coronavirus 229E NOT DETECTED NOT DETECTED Final   Coronavirus HKU1 NOT DETECTED NOT DETECTED Final   Coronavirus NL63 NOT DETECTED NOT DETECTED Final   Coronavirus OC43 NOT DETECTED NOT DETECTED Final   Metapneumovirus NOT DETECTED NOT DETECTED Final   Rhinovirus / Enterovirus NOT DETECTED NOT DETECTED Final   Influenza A NOT DETECTED NOT DETECTED Final   Influenza B NOT DETECTED NOT DETECTED Final   Parainfluenza Virus 1 NOT DETECTED NOT DETECTED Final   Parainfluenza Virus 2 NOT  DETECTED NOT DETECTED Final   Parainfluenza Virus 3 NOT DETECTED NOT DETECTED Final   Parainfluenza Virus 4 NOT DETECTED NOT DETECTED Final   Respiratory Syncytial Virus NOT DETECTED NOT DETECTED Final   Bordetella pertussis NOT DETECTED NOT DETECTED Final   Chlamydophila pneumoniae NOT DETECTED NOT DETECTED Final   Mycoplasma pneumoniae NOT DETECTED NOT DETECTED Final    Comment: Performed at [facility], [place] [address] [place], [place] [address]  Urine Culture     Status: Abnormal   Collection Time: 10/09/17  7:55 PM  Result Value Ref Range Status   Specimen Description   Final    URINE, RANDOM Performed at [place] [address] [place], [place] [address]    Special Requests   Final    NONE Performed at [facility], [place] [address] [place], [place] [address]    Culture MULTIPLE SPECIES PRESENT, SUGGEST RECOLLECTION (A)  Final   Report Status 10/11/2017 FINAL  Final  Group A Strep by PCR     Status: Abnormal   Collection Time: 10/12/17  8:20 AM  Result Value Ref Range Status   Group A Strep by PCR NONE DETECTED (A) NOT DETECTED Final    Comment: Performed at [facility], [place] [address], [place], [place] [address]  Urine Culture     Status: Abnormal   Collection Time: 10/13/17 11:00 AM  Result Value Ref Range Status   Specimen Description   Final    URINE, CLEAN CATCH Performed at [facility], [place] [address], [place], [place] [address]    Special Requests   Final    NONE Performed at [facility], [place] [address] [place], [place] [address]    Culture MULTIPLE SPECIES PRESENT, SUGGEST RECOLLECTION (A)  Final   Report Status 10/14/2017 FINAL  Final  Culture, blood (routine x 2)     Status: None (Preliminary result)   Collection Time: 10/14/17  3:02 PM  Result Value Ref Range Status   Specimen Description   Final    BLOOD RIGHT HAND Performed at [place] [address] [place], [place] [address]    Special Requests   Final    BOTTLES DRAWN AEROBIC ONLY Blood Culture results may not be optimal due to an inadequate volume of blood received in culture bottles Performed at [place] [address], [place], [place] [address]    Culture PENDING  Incomplete   Report Status PENDING  Incomplete  Culture, blood (routine x 2)     Status: None (Preliminary result)   Collection Time: 10/14/17  3:11 PM  Result Value Ref Range Status   Specimen Description   Final    BLOOD LEFT HAND Performed at [place] [address], [place], [place] [address]    Special Requests   Final    BOTTLES DRAWN AEROBIC ONLY Blood Culture results may not be optimal due to an inadequate volume of blood received in culture bottles Performed at [place] [address], [place], [place] [address]    Culture PENDING  Incomplete   Report Status PENDING  Incomplete   Time coordinating discharge: 35 minutes  SIGNED:  Kerney Elbe, DO [facility] 10/15/2017, 11:59 AM Pager [phone]  If 7PM-7AM, please contact night-coverage [email or web address] Password  [email or web address]

## 2017-10-15 NOTE — Progress Notes (Signed)
Occupational Therapy Treatment Patient Details Name: April [name] MRN: [id number] DOB: Nov 18, 1970 Today's Date: 10/15/2017    History of present illness Dalexa SHANDRIA CLINCH is a 47 y.o. female with medical history significant of recent complicated hospital admission with ARDS, influenza, healthcare associated pneumonia requiring intubation tracheostomy at [facility].  Patient was in [place] for 2 months discharged to a rehab facility where she stayed for 2 weeks and then she was discharged home.    Currently her main complaints are dyspnea on exertion, shortness of breath, and pleuritic chest pain.  Patient has history of atrial fibrillation , congestive heart failure.  Sent to ED for low BP .   OT comments  Educated on energy conservation/AE.  Issued kit to pt  Follow Up Recommendations  SNF    Equipment Recommendations  None recommended by OT    Recommendations for Other Services      Precautions / Restrictions Precautions Precautions: Fall Precaution Comments: monitor VS, incr HR Restrictions Weight Bearing Restrictions: No       Mobility Bed Mobility Overal bed mobility: Modified Independent                Transfers       Sit to Stand: Min guard         General transfer comment: for safety    Balance                                           ADL either performed or assessed with clinical judgement   ADL                       Lower Body Dressing: Min guard;Sit to/from stand;With adaptive equipment                 General ADL Comments: pt educated on and used AE:  reacher, sock aide. Also given long sponge and long shoehorn.  Pt brings RLE into W sitting on bed to don sock, but she cannot do this comfortably with L.  Educated to take rest breaks for energy conservation.  She has a shower seat at home (mother's) but sponge bathes as she cannot currently step over tub.  She has a small bathroom; unsure if tub bench would work.  Pt  did not perform toileting and did not feel she needed a toilet aide     Vision       Perception     Praxis      Cognition Arousal/Alertness: Awake/alert Behavior During Therapy: WFL for tasks assessed/performed Overall Cognitive Status: Within Functional Limits for tasks assessed                                          Exercises General Exercises - Upper Extremity Shoulder Flexion: (A/AAROM for bil shoulders; can get almost 90 herself)   Shoulder Instructions       General Comments      Pertinent Vitals/ Pain       Pain Assessment: No/denies pain  Home Living  Prior Functioning/Environment              Frequency  Min 2X/week        Progress Toward Goals  OT Goals(current goals can now be found in the care plan section)  Progress towards OT goals: Progressing toward goals     Plan      Co-evaluation                 AM-PAC PT "6 Clicks" Daily Activity     Outcome Measure   Help from another person eating meals?: None Help from another person taking care of personal grooming?: A Little Help from another person toileting, which includes using toliet, bedpan, or urinal?: A Little Help from another person bathing (including washing, rinsing, drying)?: A Little Help from another person to put on and taking off regular upper body clothing?: A Little Help from another person to put on and taking off regular lower body clothing?: A Little 6 Click Score: 19    End of Session    OT Visit Diagnosis: Unsteadiness on feet (R26.81);History of falling (Z91.81);Muscle weakness (generalized) (M62.81);Other abnormalities of gait and mobility (R26.89)   Activity Tolerance Patient tolerated treatment well   Patient Left in bed;with call bell/phone within reach   Nurse Communication          Time: 2902-1115 OT Time Calculation (min): 11 min  Charges: OT General Charges $OT  Visit: 1 Visit OT Treatments $Self Care/Home Management : 8-22 mins  Lesle Chris, OTR/L [phone] 10/15/2017    [place] 10/15/2017, 9:01 AM

## 2017-10-16 ENCOUNTER — Encounter: Payer: Self-pay | Admitting: Adult Health

## 2017-10-16 ENCOUNTER — Ambulatory Visit: Payer: Medicaid Other | Admitting: Internal Medicine

## 2017-10-16 ENCOUNTER — Non-Acute Institutional Stay (SKILLED_NURSING_FACILITY): Payer: Medicaid Other | Admitting: Adult Health

## 2017-10-16 DIAGNOSIS — I482 Chronic atrial fibrillation, unspecified: Secondary | ICD-10-CM

## 2017-10-16 DIAGNOSIS — M5090 Cervical disc disorder, unspecified, unspecified cervical region: Secondary | ICD-10-CM

## 2017-10-16 DIAGNOSIS — J449 Chronic obstructive pulmonary disease, unspecified: Secondary | ICD-10-CM | POA: Diagnosis not present

## 2017-10-16 DIAGNOSIS — I5032 Chronic diastolic (congestive) heart failure: Secondary | ICD-10-CM

## 2017-10-16 DIAGNOSIS — J4541 Moderate persistent asthma with (acute) exacerbation: Secondary | ICD-10-CM

## 2017-10-16 DIAGNOSIS — K219 Gastro-esophageal reflux disease without esophagitis: Secondary | ICD-10-CM | POA: Diagnosis not present

## 2017-10-16 DIAGNOSIS — J0100 Acute maxillary sinusitis, unspecified: Secondary | ICD-10-CM

## 2017-10-16 IMAGING — DX DG CHEST 2V
2 series · 2 of 2 positions shown · non-contrast
Comparison: 06/21/2016

CLINICAL DATA: Worsening asthma

EXAM:
CHEST  2 VIEW

[w chest pa]
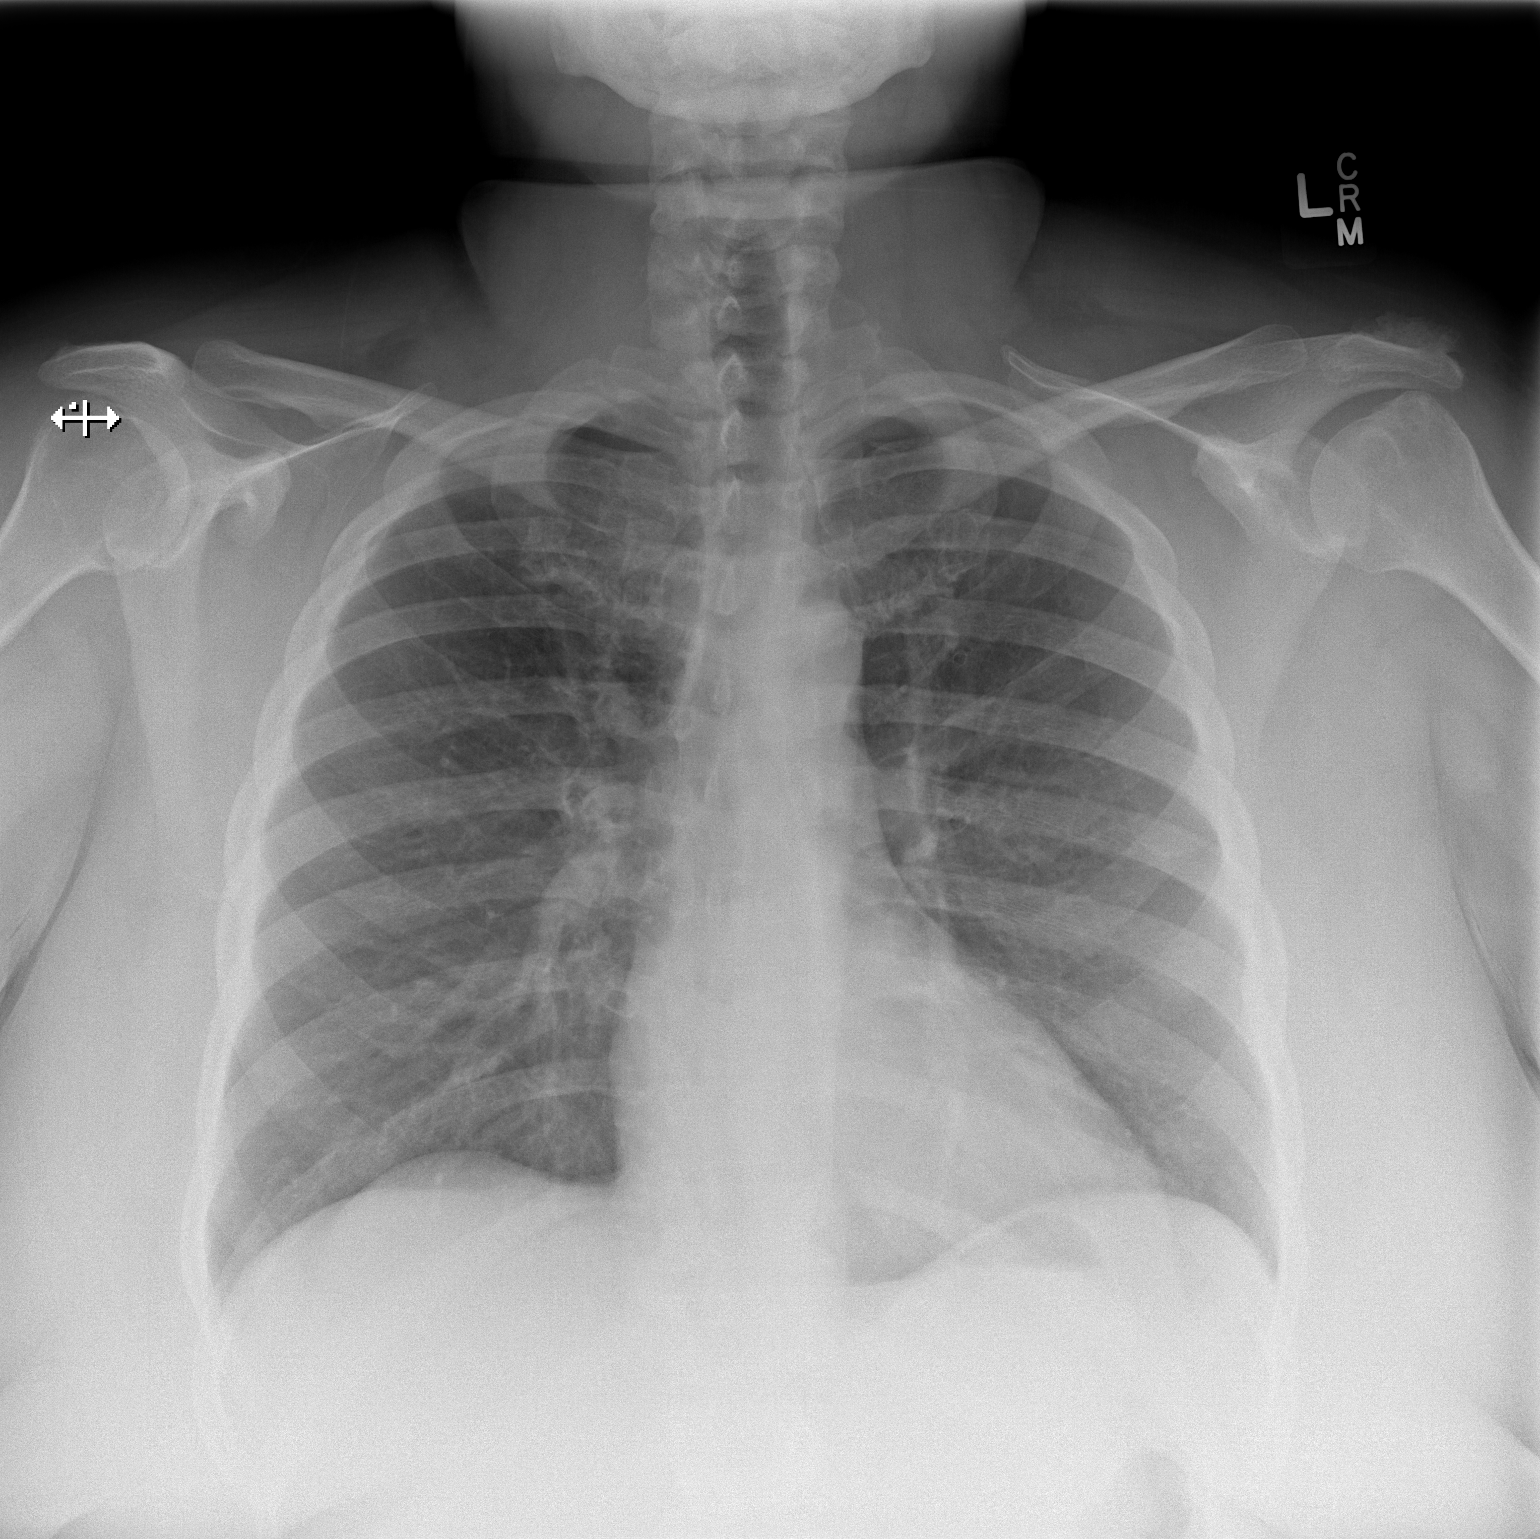

[w chest lat]
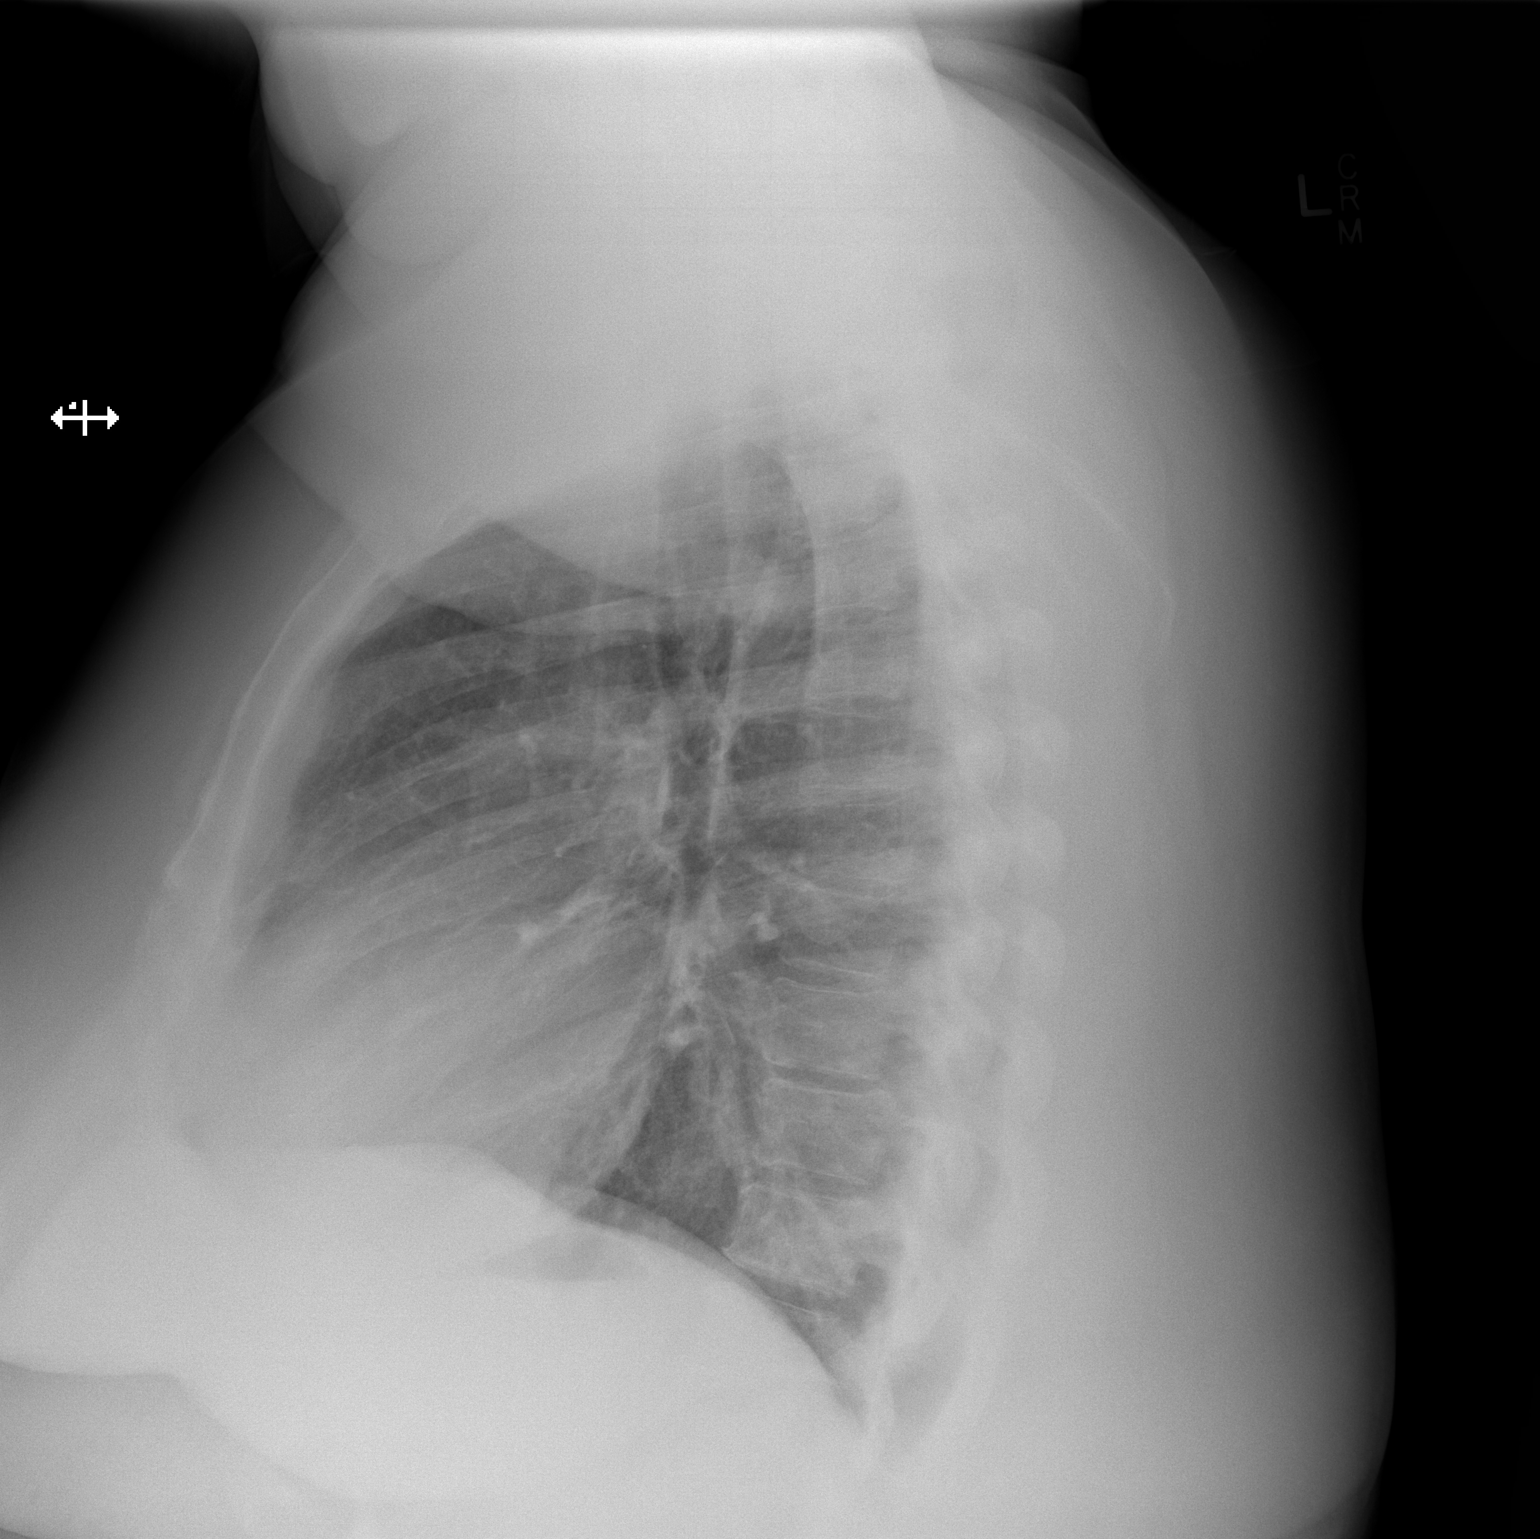

[2 of 2 positions shown; findings below may reference images not displayed]

FINDINGS: The heart size and mediastinal contours are within normal limits.
Both lungs are clear. The visualized skeletal structures are
unremarkable.
IMPRESSION: No active cardiopulmonary disease.

## 2017-10-16 NOTE — Progress Notes (Signed)
Location:   [facility] 2 Room Number: 102 A Place of Service:  SNF (31)   CODE STATUS: Full code  Allergies  Allergen Reactions  . Contrast Media [Iodinated Diagnostic Agents] Itching    Ct contrast  . Oxycodone-Acetaminophen Itching    Chief Complaint  Patient presents with  . Hospitalization Follow-up    Hospital follow up    HPI:  She is a 47 year old who has recently had a prolonged hospitalization at [facility] for ARDS influenza; pneumonia. She was intubated and did require a trach which has since been removed. She was admitted to rehab and was discharged to home . She was at home for 2 weeks prior to her hospitalization from 10-08-17 through 10-15-17. She was hospitalized for acute sinusitis of viral etiology; pleuritic chest pain which has resolved and physical deconditioning. She is here for shor term rehab with her goal to return back home. She does get tired easily; she denies any chest pain; no cough. She will continue to be followed for her chronic illnesses including: diastolic heart failure; afib; copd. There are no nursing concerns at this time.   Past Medical History:  Diagnosis Date  . Acanthosis nigricans   . Anxiety   . Arthritis    "knees" (04/28/2017)  . Asthma    Followed by Dr. Annamaria Boots (pulmonology); receives every other week omalizumab injections; has frequent exacerbations  . COPD (chronic obstructive pulmonary disease) ([place])    PFTs in 2002, FEV1/FVC 65, no post bronchodilater test done  . Depression   . GERD (gastroesophageal reflux disease)   . Headache(784.0)    "q couple days" (04/28/2017)  . Helicobacter pylori (H. pylori) infection   . Hypertension, essential   . Insomnia   . Menorrhagia   . Morbid obesity ([place])   . OSA on CPAP    Sleep study 2008 - mild OSA, not enough events to titrate CPAP; wears CPAP now/pt on 04/28/2017  . Pneumonia X 1  . Seasonal allergies   . Shortness of breath   . Tobacco user     Past  Surgical History:  Procedure Laterality Date  . CARDIOVERSION N/A 05/30/2017   Procedure: CARDIOVERSION;  Surgeon: Sanda Klein, MD;  Location: [facility];  Service: Cardiovascular;  Laterality: N/A;  . REDUCTION MAMMAPLASTY Bilateral 09/2011  . TUBAL LIGATION  1996   bilateral    Social History   Socioeconomic History  . Marital status: Single    Spouse name: Not on file  . Number of children: 1  . Years of education: Not on file  . Highest education level: Not on file  Occupational History  . Occupation: CNA  Social Needs  . Financial resource strain: Not on file  . Food insecurity:    Worry: Not on file    Inability: Not on file  . Transportation needs:    Medical: Not on file    Non-medical: Not on file  Tobacco Use  . Smoking status: Former Smoker    Packs/day: 0.50    Years: 26.00    Pack years: 13.00    Types: Cigarettes    Last attempt to quit: 09/12/2014    Years since quitting: 3.0  . Smokeless tobacco: Never Used  Substance and Sexual Activity  . Alcohol use: No  . Drug use: No  . Sexual activity: Never  Lifestyle  . Physical activity:    Days per week: Not on file    Minutes per session: Not on file  .  Stress: Not on file  Relationships  . Social connections:    Talks on phone: Not on file    Gets together: Not on file    Attends religious service: Not on file    Active member of club or organization: Not on file    Attends meetings of clubs or organizations: Not on file    Relationship status: Not on file  . Intimate partner violence:    Fear of current or ex partner: Not on file    Emotionally abused: Not on file    Physically abused: Not on file    Forced sexual activity: Not on file  Other Topics Concern  . 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   Family History  Problem Relation Age of Onset  . Hypertension Mother   . Asthma Daughter   . Cancer Paternal Aunt   . Asthma Maternal  Grandmother       VITAL SIGNS BP 111/72   Pulse 86   Temp 98.1 F (36.7 C)   Resp 20   Ht 5\' 6"  (1.676 m) Comment: taken from epic 10/11/17 - not obtained from facility  Wt (!) 307 lb (139.3 kg) Comment: taken from epic 10/11/17 -  not obtained from facility  LMP 10/07/2017 (Approximate)   BMI 49.55 kg/m   Outpatient Encounter Medications as of 10/16/2017  Medication Sig  . amoxicillin-clavulanate (AUGMENTIN) 875-125 MG tablet Take 1 tablet by mouth every 12 (twelve) hours for 3 days.  [name] apixaban (ELIQUIS) 5 MG TABS tablet Take 5 mg by mouth 2 (two) times daily.  . budesonide-formoterol (SYMBICORT) 160-4.5 MCG/ACT inhaler Inhale 2 puffs into the lungs 2 (two) times daily.  . diclofenac sodium (VOLTAREN) 1 % GEL Apply 1 application topically 4 (four) times daily.  . fluticasone (FLONASE) 50 MCG/ACT nasal spray Place 1 spray into both nostrils daily.  . furosemide (LASIX) 40 MG tablet Take 40 mg by mouth daily.  [name] guaiFENesin (MUCINEX) 600 MG 12 hr tablet Take 2 tablets (1,200 mg total) by mouth 2 (two) times daily.  [name] ipratropium-albuterol (DUONEB) 0.5-2.5 (3) MG/3ML SOLN Take 3 mLs by nebulization every 2 (two) hours as needed.  [name] ipratropium-albuterol (DUONEB) 0.5-2.5 (3) MG/3ML SOLN Take 3 mLs by nebulization 2 (two) times daily.  [name] menthol-cetylpyridinium (CEPACOL) 3 MG lozenge Take 1 lozenge by mouth every 4 (four) hours as needed for sore throat.  . montelukast (SINGULAIR) 10 MG tablet Take 10 mg by mouth at bedtime.  . Multiple Vitamin (MULTIVITAMIN WITH MINERALS) TABS tablet Take 1 tablet by mouth daily.  . pantoprazole (PROTONIX) 40 MG tablet Take 40 mg by mouth daily.  . Potassium Chloride ER 20 MEQ TBCR Take 20 mEq by mouth daily.  . sertraline (ZOLOFT) 100 MG tablet Take 100 mg by mouth daily.  . sodium chloride (OCEAN) 0.65 % SOLN nasal spray Place 1 spray into both nostrils as needed for congestion.  . [DISCONTINUED] menthol-cetylpyridinium (CEPACOL) 3 MG lozenge Take 1  lozenge (3 mg total) by mouth as needed for sore throat.   No facility-administered encounter medications on file as of 10/16/2017.      SIGNIFICANT DIAGNOSTIC EXAMS  TODAY:   10-08-17: chest x-ray: Cardiomegaly with overall improving aeration. There could be residual LEFT lower lobe infiltrate.  10-08-17: ct of chest: 1. No acute cardiopulmonary disease. 2. Mild bilateral chronic interstitial lung disease.  10-08-17: ct angio of chest: 1. No pulmonary embolus to the  segmental level. 2. Lung parenchymal better assessed on noncontrast CT earlier this day given degree of motion on the current exam. Findings suspicious for interstitial lung disease again seen.  10-11-17: chest x-ray: 1. No definite acute findings. 2. Mild interstitial prominence, as on 10/08/2017.  10-13-17: ct of soft tissue of neck: Noncontrast CT of the neck demonstrates no mass or airway Compromise.  10-14-17: chest x-ray: No acute cardiopulmonary abnormality seen.   LABS REVIEWED: TODAY:   10-08-17: wbc 15.8; hgb 12.6; hct 39.1; mcv 84.3; plt 401; glucose 94; bun <5; creat 0.93; k+ 2.7; na++ 138; ca 8.3; mag 1.3; d-dimer: 0.93 10-10-17: wbc 11.5; hgb 10.6; hct 33.6; mcv 83.8; plt 398; glucose 82; bun <5; creat 0.67; k+ 3.7; na++ 139; ca 8.0 10-12-17: wbc 15.4; hgb 10.5; hct 33.1; mcv 83.4; plt 428; glucose 83; bun <0.5; creat 0.6; k+ 3.1; na++ 139; ca 8.0 10-15-17: wbc 17.2; hgb 10.7; hct 33.5; mcv 83.1; plt 451; glucose 87; bun <5; creat 0.67; k+ 3.2; na++ 137; ca 8.2; mag 1.7; phos 5.2; liver normal albumin 2.3     Review of Systems  Constitutional: Positive for malaise/fatigue.       Does fatigue easily   Respiratory: Negative for cough and shortness of breath.   Cardiovascular: Negative for chest pain, palpitations and leg swelling.  Gastrointestinal: Negative for abdominal pain, constipation and heartburn.  Musculoskeletal: Negative for back pain, joint pain and myalgias.  Skin: Negative.   Neurological: Negative  for dizziness.  Psychiatric/Behavioral: The patient is not nervous/anxious.      Physical Exam  Constitutional: She is oriented to person, place, and time. She appears well-developed and well-nourished. No distress.  Morbid obesity   HENT:  Mild maxillary tenderness to palpation present   Neck: No thyromegaly present.  Cardiovascular: Normal rate, regular rhythm and intact distal pulses.  Murmur heard. 1/6  Pulmonary/Chest: Effort normal and breath sounds normal. No respiratory distress.  Abdominal: Soft. Bowel sounds are normal. She exhibits no distension. There is no tenderness.  Musculoskeletal: Normal range of motion. She exhibits no edema.  Lymphadenopathy:    She has no cervical adenopathy.  Neurological: She is alert and oriented to person, place, and time.  Skin: Skin is warm and dry. She is not diaphoretic.  Psychiatric: She has a normal mood and affect.     ASSESSMENT/ PLAN:  TODAY:   1. Chronic diastolic heart failure: stable EF 60-65% (08-04-17): will continue lasix 40 mg daily with k+ 20 meq daily   2.  Chronic atrial fibrillation: heart rate stable: will continue eliquis 5 mg twice daily   3. COPD: stable will continue symbicort 160/4.5 mcg 2 puffs twice daily duoneb twice daily and every 2 hours as needed and mucinex 1200 mg twice daily   4. Moderate persistent astham with exacerbation: is stable will continue singulair daily; flonase daily  5. GERD without esophagitis: stable will continue protonix 40 mg daily   6. Anxiety and depression: stable will continue zoloft 100 mg daily   7. Cervical back pain with evidence of disc disease: is stable is using voltaren gel four times daily   8. Acute sinusitis: is improving: will complete augmentin 875 mg twice daily for total of 3 days will monitor  Will begin daily weights  Will need follow up with pulmonology in the next 1-2 weeks Will check cbc; cmp mag phos Will repeat chest x-ray in 4 weeks.    MD is  aware of resident's narcotic use and is in agreement  with current plan of care. We will attempt to wean resident as apropriate   Ok Edwards NP [facility]  Contact [phone] Monday through Friday 8am- 5pm  After hours call [phone]

## 2017-10-17 ENCOUNTER — Telehealth: Payer: Self-pay | Admitting: Internal Medicine

## 2017-10-17 ENCOUNTER — Telehealth: Payer: Self-pay | Admitting: Family Medicine

## 2017-10-17 ENCOUNTER — Non-Acute Institutional Stay (SKILLED_NURSING_FACILITY): Payer: Medicaid Other | Admitting: Adult Health

## 2017-10-17 DIAGNOSIS — I5032 Chronic diastolic (congestive) heart failure: Secondary | ICD-10-CM

## 2017-10-17 DIAGNOSIS — J449 Chronic obstructive pulmonary disease, unspecified: Secondary | ICD-10-CM | POA: Diagnosis not present

## 2017-10-17 DIAGNOSIS — R5381 Other malaise: Secondary | ICD-10-CM

## 2017-10-17 NOTE — Telephone Encounter (Signed)
Call placed to [facility] at [place] [phone] regarding patient's status and discharge. Left a message for Marcelina Morel, Market researcher, asking her for a call back at [phone] regarding patient. She can also speak with Opal Sidles.

## 2017-10-17 NOTE — Telephone Encounter (Signed)
Received call from [place] at [facility].  They were unaware of 10/16/17, appointment with Dr Annamaria Boots.  They are wanting to reschedule her 3 month follow up visit, but first available OV is in 12/2017.  Informed Jolane of schedule and that a message would be passed to Dr Annamaria Boots.  Dr Annamaria Boots please advise  Allergies  Allergen Reactions  . Contrast Media [Iodinated Diagnostic Agents] Itching    Ct contrast  . Oxycodone-Acetaminophen Itching   Current Outpatient Medications on File Prior to Visit  Medication Sig Dispense Refill  . amoxicillin-clavulanate (AUGMENTIN) 875-125 MG tablet Take 1 tablet by mouth every 12 (twelve) hours for 3 days. 6 tablet 0  . apixaban (ELIQUIS) 5 MG TABS tablet Take 5 mg by mouth 2 (two) times daily.    . budesonide-formoterol (SYMBICORT) 160-4.5 MCG/ACT inhaler Inhale 2 puffs into the lungs 2 (two) times daily.    . diclofenac sodium (VOLTAREN) 1 % GEL Apply 1 application topically 4 (four) times daily.    . fluticasone (FLONASE) 50 MCG/ACT nasal spray Place 1 spray into both nostrils daily.  2  . furosemide (LASIX) 40 MG tablet Take 40 mg by mouth daily.    [name] guaiFENesin (MUCINEX) 600 MG 12 hr tablet Take 2 tablets (1,200 mg total) by mouth 2 (two) times daily.    [name] ipratropium-albuterol (DUONEB) 0.5-2.5 (3) MG/3ML SOLN Take 3 mLs by nebulization every 2 (two) hours as needed. 360 mL   . ipratropium-albuterol (DUONEB) 0.5-2.5 (3) MG/3ML SOLN Take 3 mLs by nebulization 2 (two) times daily. 360 mL   . menthol-cetylpyridinium (CEPACOL) 3 MG lozenge Take 1 lozenge by mouth every 4 (four) hours as needed for sore throat.    . montelukast (SINGULAIR) 10 MG tablet Take 10 mg by mouth at bedtime.    . Multiple Vitamin (MULTIVITAMIN WITH MINERALS) TABS tablet Take 1 tablet by mouth daily.    . pantoprazole (PROTONIX) 40 MG tablet Take 40 mg by mouth daily.    . Potassium Chloride ER 20 MEQ TBCR Take 20 mEq by mouth daily.    . sertraline (ZOLOFT) 100 MG tablet Take 100 mg by  mouth daily.    . sodium chloride (OCEAN) 0.65 % SOLN nasal spray Place 1 spray into both nostrils as needed for congestion.  0   No current facility-administered medications on file prior to visit.

## 2017-10-17 NOTE — Telephone Encounter (Signed)
Per CY ok for appt with him or NP.  [place] and spoke to [place]. Appointment made for 10/23/17 at 10:00, with Tyler Aas. Jolane said they would transport her.  Nothing further at this time.

## 2017-10-17 NOTE — Telephone Encounter (Signed)
ROV as available, me or NP

## 2017-10-18 IMAGING — CT CT ANGIO CHEST
2 of 8 series · 18 of 46 positions shown · IV contrast (isovue)
Comparison: Chest x-ray dated 09/30/2016 and chest CT dated
08/23/2015

CLINICAL DATA: Shortness of breath.

EXAM:
CT ANGIOGRAPHY CHEST WITH CONTRAST
TECHNIQUE: Multidetector CT imaging of the chest was performed using the
standard protocol during bolus administration of intravenous
contrast. Multiplanar CT image reconstructions and MIPs were
obtained to evaluate the vascular anatomy.
CONTRAST:  100 cc Isovue 370

[Series 6: thins · axial · 0.76mm/px · z∈[+1185,+1459]mm · 15 of 302 slices shown]
[im 14/302  lung]
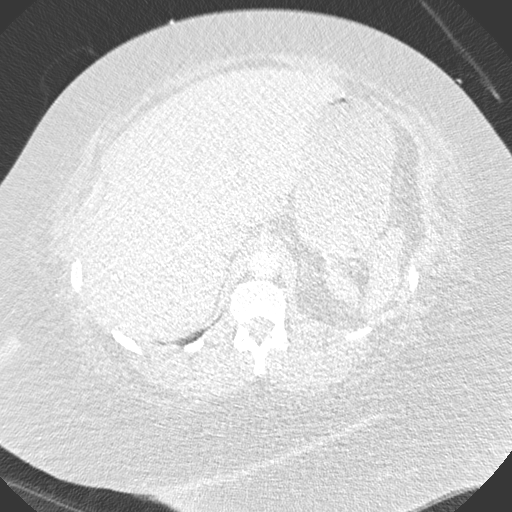
[im 42/302  soft-tissue]
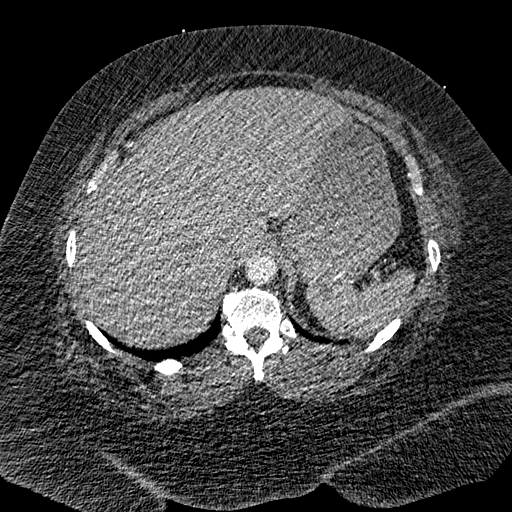
[im 55/302  lung]
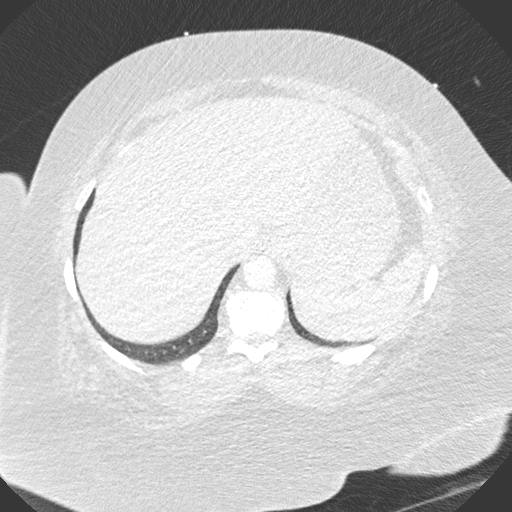
[im 69/302  soft-tissue]
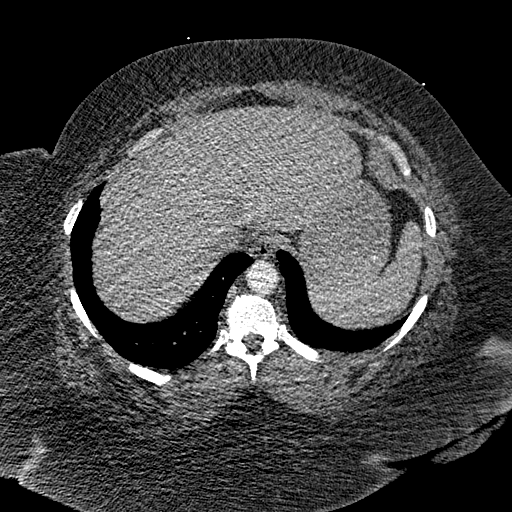
[im 96/302  lung]
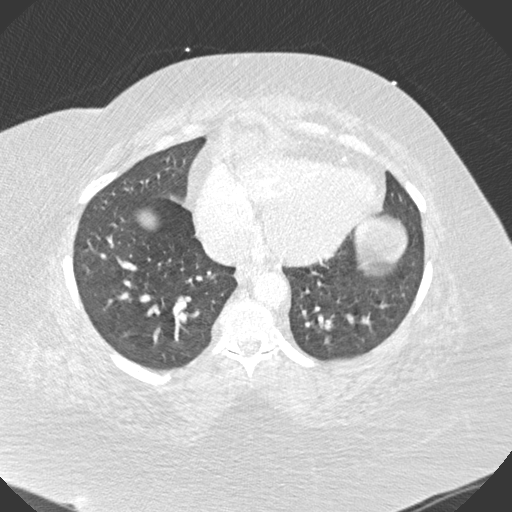
[im 110/302  soft-tissue]
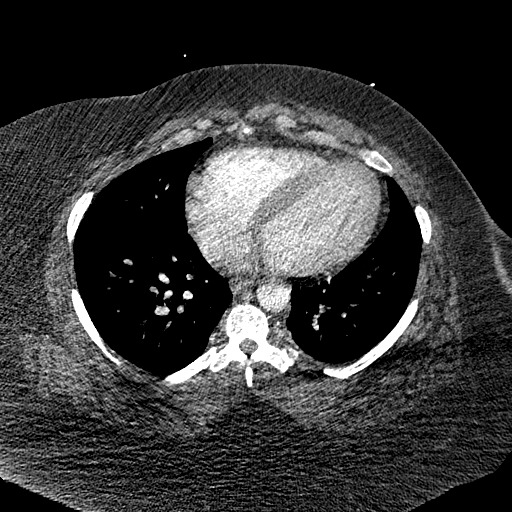
[im 137/302  lung]
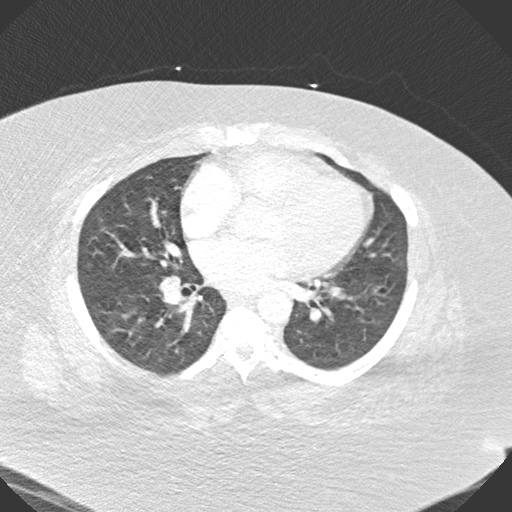
[im 151/302  soft-tissue]
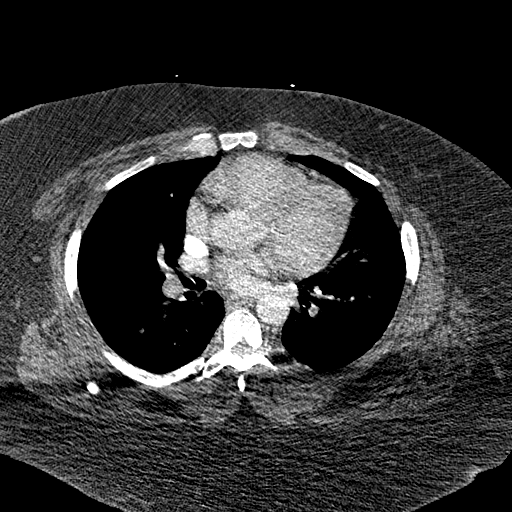
[im 165/302  lung]
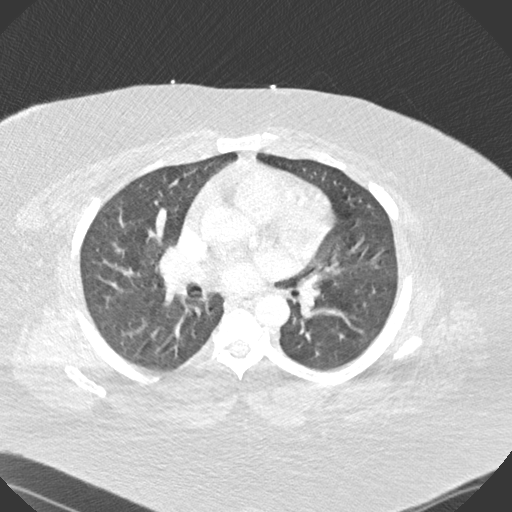
[im 192/302  soft-tissue]
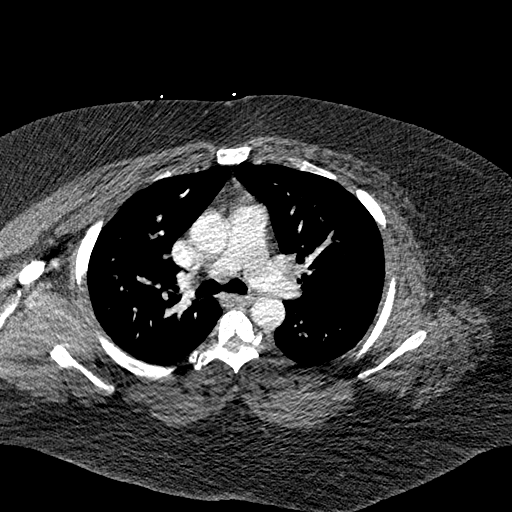
[im 206/302  lung]
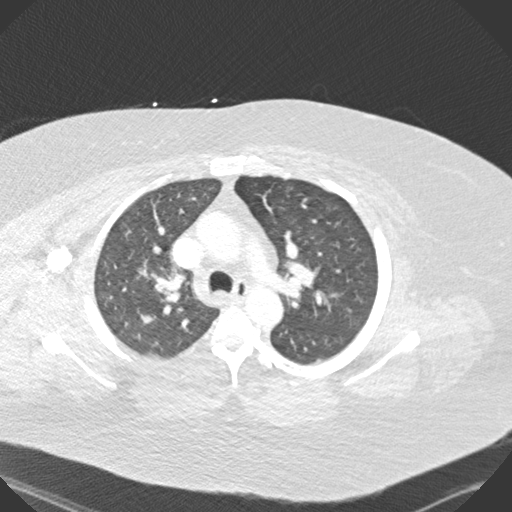
[im 233/302  soft-tissue]
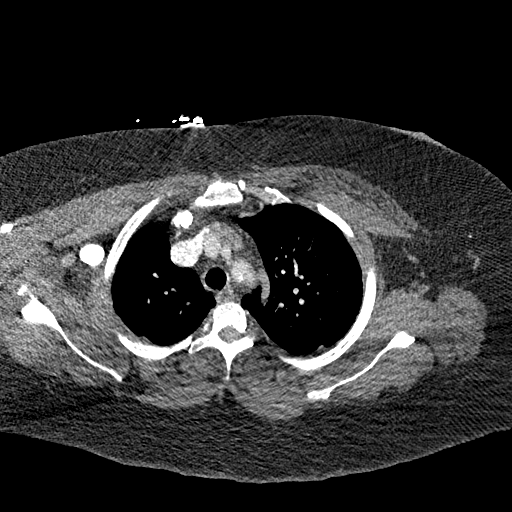
[im 247/302  lung]
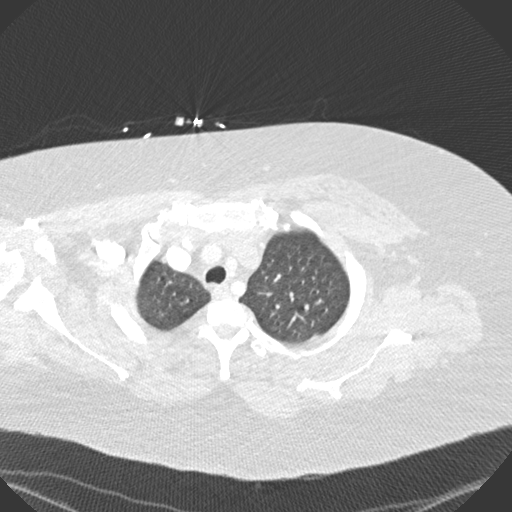
[im 260/302  soft-tissue]
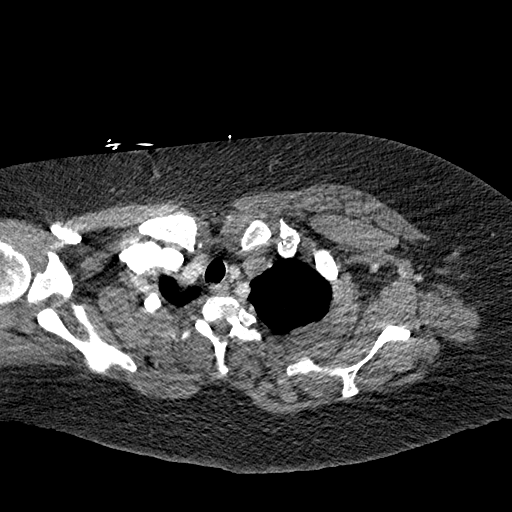
[im 288/302  lung]
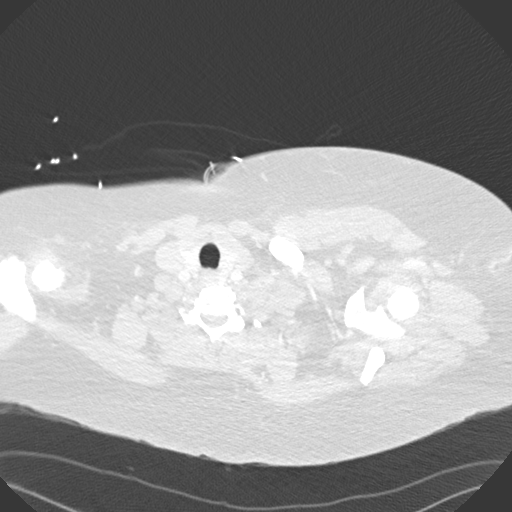

[Series 8: coronal mpr · coronal · 0.59mm/px · 3 of 135 slices shown]
[im 34/135  soft-tissue]
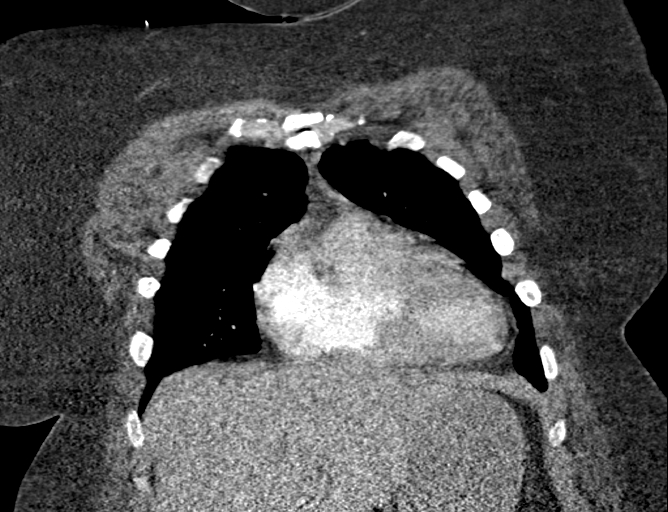
[im 68/135  soft-tissue]
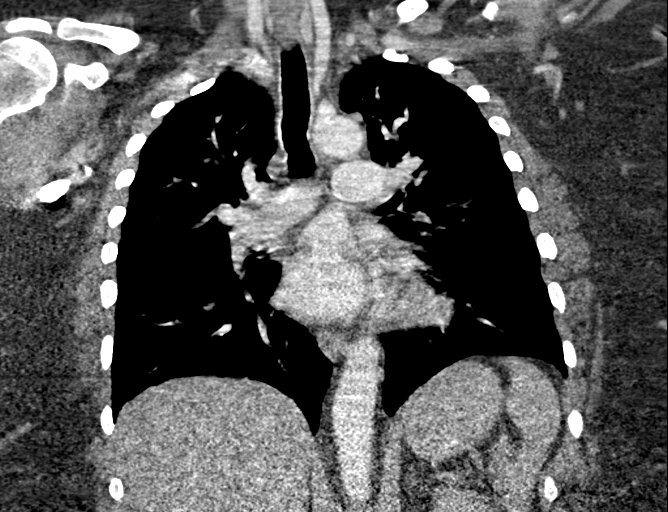
[im 101/135  soft-tissue]
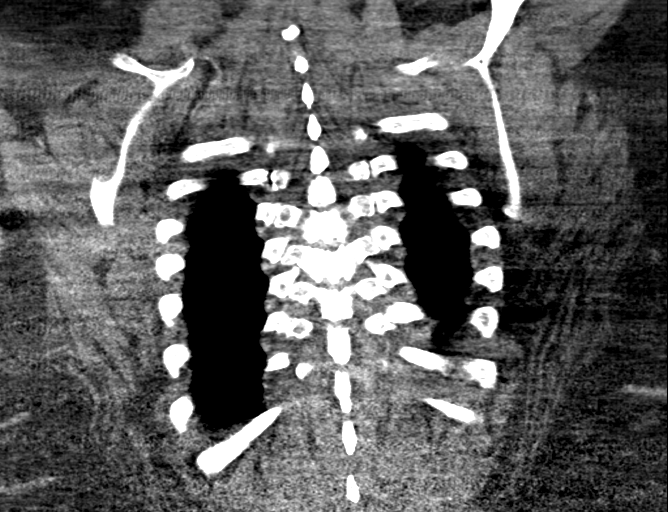

[18 of 46 positions shown; findings below may reference images not displayed]

FINDINGS: Cardiovascular: No visible pulmonary emboli. However, opacification
of the segmental vessels is suboptimal due to the patient's body
habitus. RV LV ratio is normal. Mild cardiomegaly.

Mediastinum/Nodes: No enlarged mediastinal, hilar, or axillary lymph
nodes. Thyroid gland, trachea, and esophagus demonstrate no
significant findings.

Lungs/Pleura: Lungs are clear. No pleural effusion or pneumothorax.

Upper Abdomen: No acute abnormality.

Musculoskeletal: No chest wall abnormality. No acute or significant
osseous findings.

Review of the MIP images confirms the above findings.
IMPRESSION: 1. No large pulmonary emboli. Opacification of segmental vessels is
suboptimal so I cannot exclude small pulmonary emboli.
2. Mild cardiomegaly.
3. Clear lungs.

## 2017-10-19 LAB — CULTURE, BLOOD (ROUTINE X 2)
Culture: NO GROWTH
Culture: NO GROWTH

## 2017-10-20 ENCOUNTER — Telehealth: Payer: Self-pay | Admitting: Pulmonary Disease

## 2017-10-20 ENCOUNTER — Non-Acute Institutional Stay (SKILLED_NURSING_FACILITY): Payer: Medicaid Other | Admitting: Internal Medicine

## 2017-10-20 ENCOUNTER — Encounter: Payer: Self-pay | Admitting: Adult Health

## 2017-10-20 ENCOUNTER — Encounter: Payer: Self-pay | Admitting: Internal Medicine

## 2017-10-20 ENCOUNTER — Other Ambulatory Visit: Payer: Self-pay

## 2017-10-20 DIAGNOSIS — J4540 Moderate persistent asthma, uncomplicated: Secondary | ICD-10-CM

## 2017-10-20 DIAGNOSIS — R5381 Other malaise: Secondary | ICD-10-CM

## 2017-10-20 DIAGNOSIS — G4701 Insomnia due to medical condition: Secondary | ICD-10-CM | POA: Diagnosis not present

## 2017-10-20 DIAGNOSIS — I5032 Chronic diastolic (congestive) heart failure: Secondary | ICD-10-CM

## 2017-10-20 DIAGNOSIS — R0781 Pleurodynia: Secondary | ICD-10-CM

## 2017-10-20 DIAGNOSIS — J301 Allergic rhinitis due to pollen: Secondary | ICD-10-CM

## 2017-10-20 DIAGNOSIS — F419 Anxiety disorder, unspecified: Secondary | ICD-10-CM

## 2017-10-20 DIAGNOSIS — E878 Other disorders of electrolyte and fluid balance, not elsewhere classified: Secondary | ICD-10-CM

## 2017-10-20 DIAGNOSIS — F329 Major depressive disorder, single episode, unspecified: Secondary | ICD-10-CM

## 2017-10-20 DIAGNOSIS — Z6841 Body Mass Index (BMI) 40.0 and over, adult: Secondary | ICD-10-CM

## 2017-10-20 DIAGNOSIS — F32A Depression, unspecified: Secondary | ICD-10-CM

## 2017-10-20 DIAGNOSIS — I10 Essential (primary) hypertension: Secondary | ICD-10-CM

## 2017-10-20 LAB — CBC AND DIFFERENTIAL
HCT: 35 — AB (ref 36–46)
HEMOGLOBIN: 11.4 — AB (ref 12.0–16.0)
NEUTROS ABS: 10
PLATELETS: 474 — AB (ref 150–399)
WBC: 16.1

## 2017-10-20 LAB — BASIC METABOLIC PANEL
BUN: 2 — AB (ref 4–21)
CREATININE: 0.6 (ref 0.5–1.1)
Glucose: 69
Potassium: 3.4 (ref 3.4–5.3)
Sodium: 137 (ref 137–147)

## 2017-10-20 LAB — HEPATIC FUNCTION PANEL
ALK PHOS: 109 (ref 25–125)
ALT: 30 (ref 7–35)
AST: 31 (ref 13–35)
Bilirubin, Total: 0.2

## 2017-10-20 IMAGING — CT CT KNEE*L* W/O CM
3 of 4 series · 16 of 33 positions shown, 18 images · non-contrast
Comparison: None.

CLINICAL DATA: The patient felt a pop in the left knee today when
standing up with onset of diffuse distal thigh pain. Initial
encounter.

EXAM:
CT OF THE LEFT KNEE WITHOUT CONTRAST
TECHNIQUE: Multidetector CT imaging of the left knee was performed according to
the standard protocol. Multiplanar CT image reconstructions were
also generated.

[Series 4: lower ext 1.5 st · axial · 0.60mm/px · z∈[+385,+600]mm · 8 of 171 slices shown, 10 images]
[im 14/171  soft-tissue]
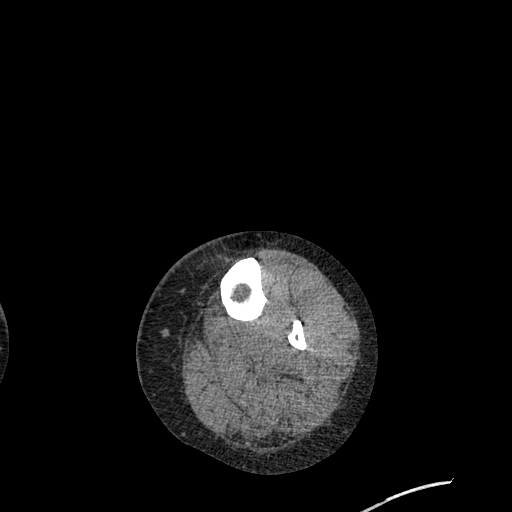
[im 14/171  bone]
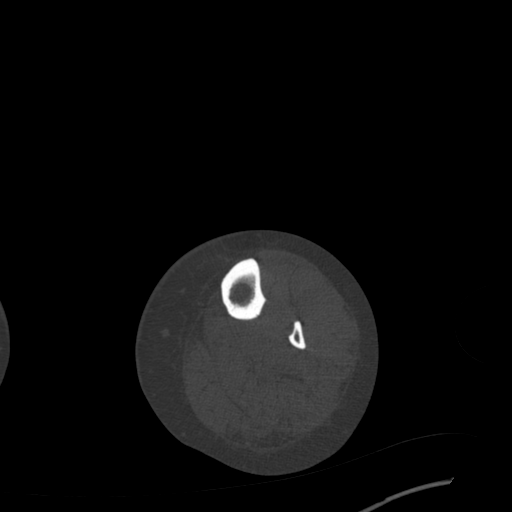
[im 40/171  bone]
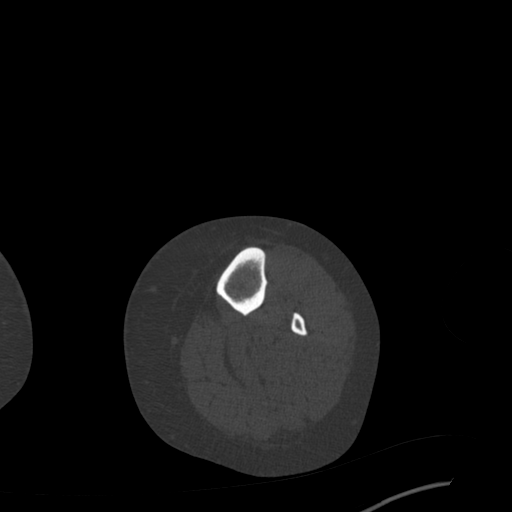
[im 53/171  bone]
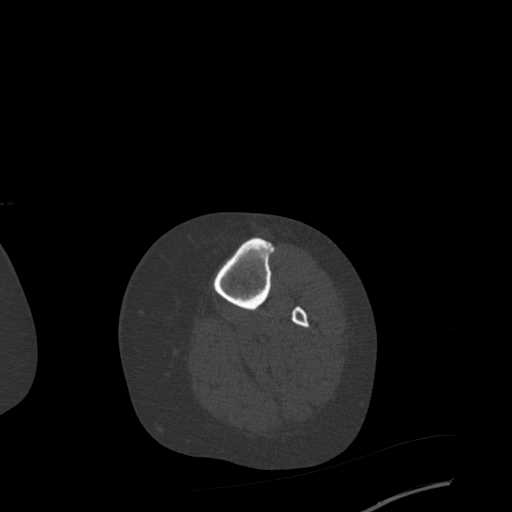
[im 79/171  bone]
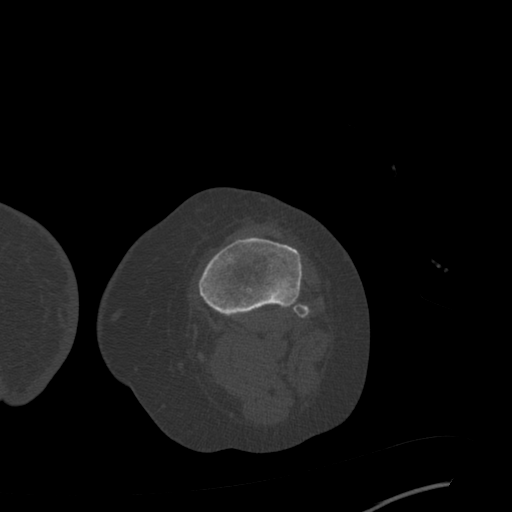
[im 92/171  soft-tissue]
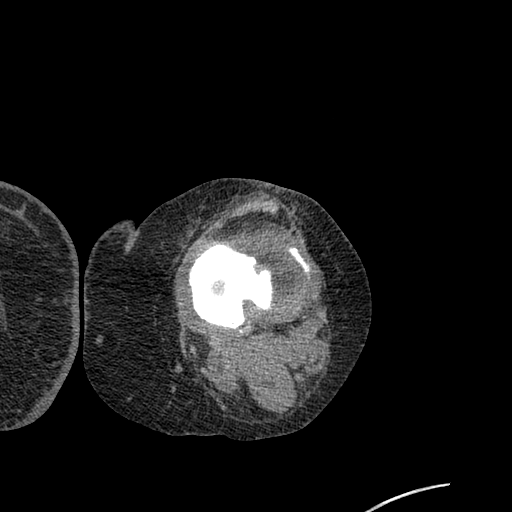
[im 92/171  bone]
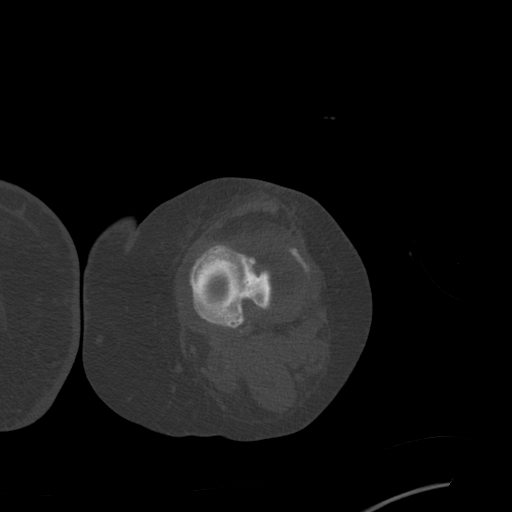
[im 118/171  bone]
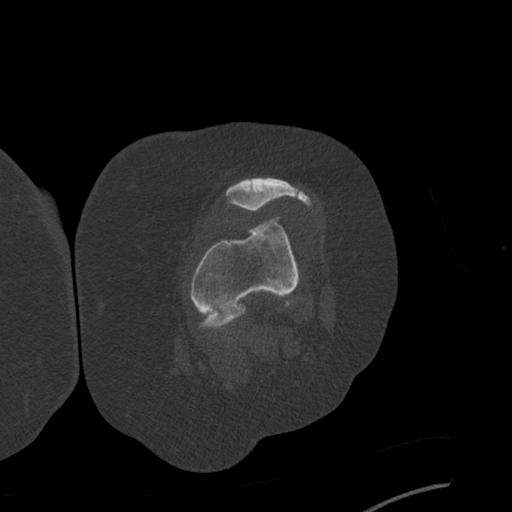
[im 131/171  bone]
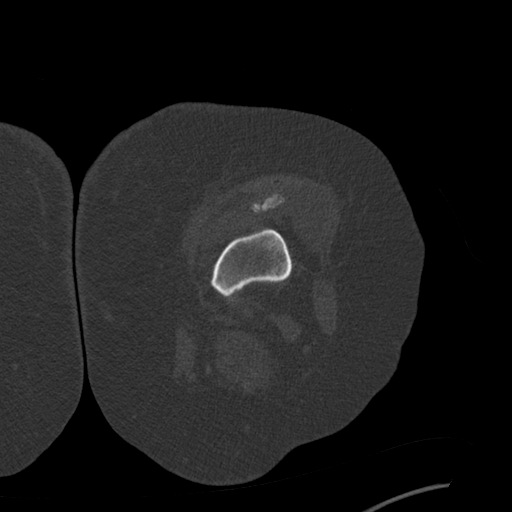
[im 157/171  bone]
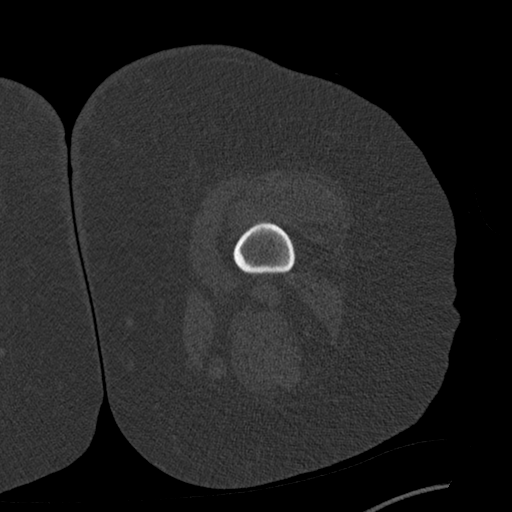

[Series 8: lower ext sag bone · sagittal · 0.42mm/px · 5 of 147 slices shown]
[im 25/147  bone]
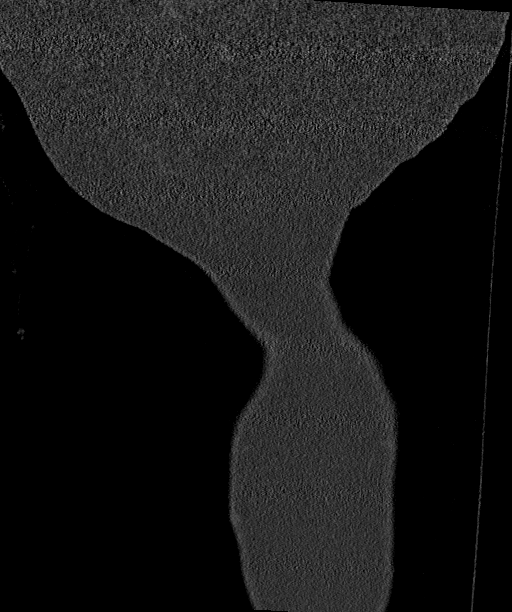
[im 49/147  bone]
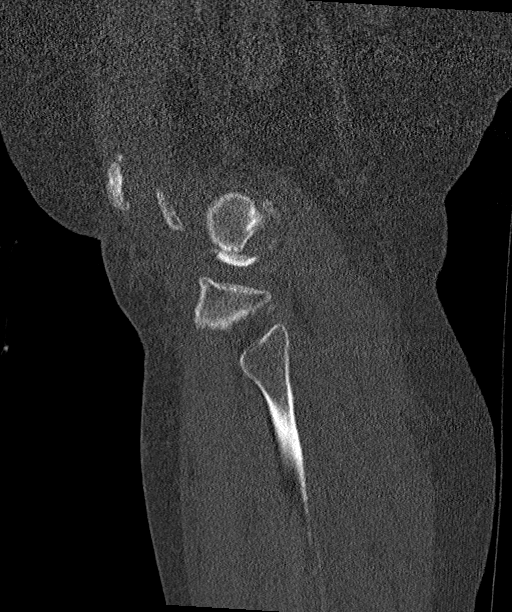
[im 74/147  bone]
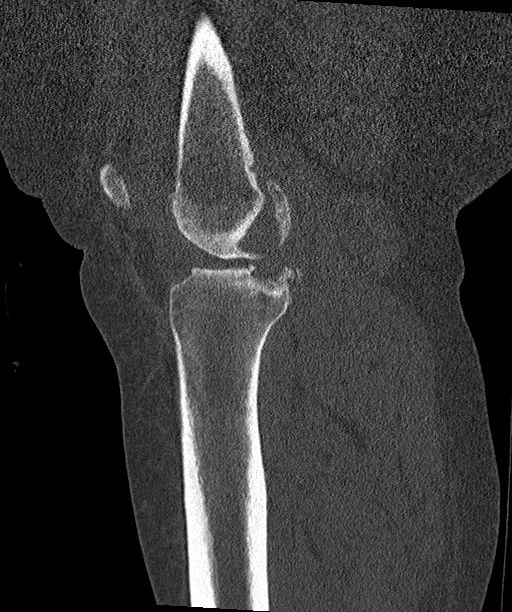
[im 98/147  bone]
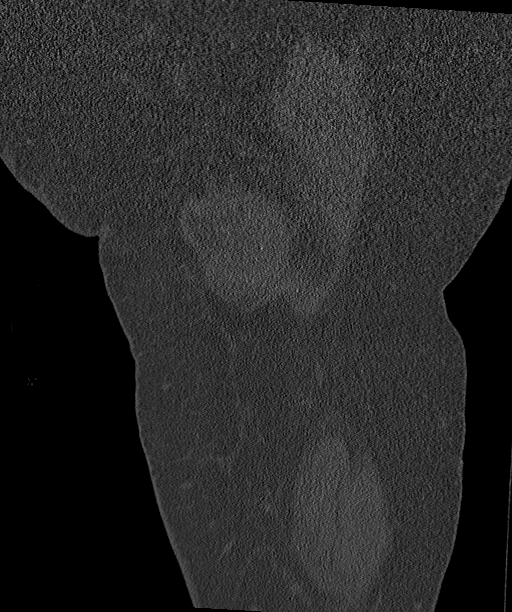
[im 122/147  bone]
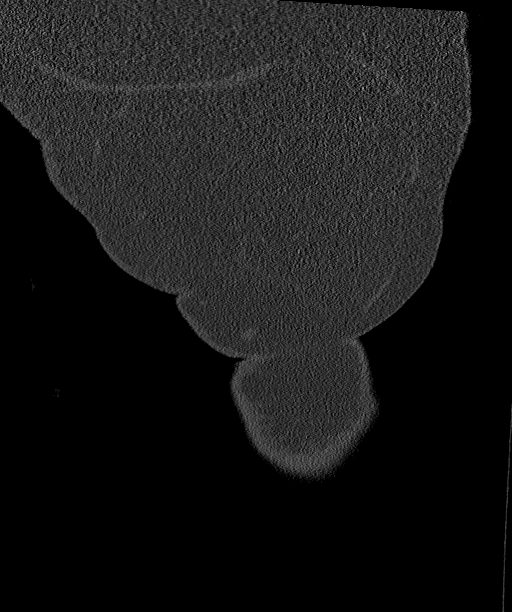

[Series 9: lower ext cor st · coronal · 0.47mm/px · 3 of 146 slices shown]
[im 30/146  bone]
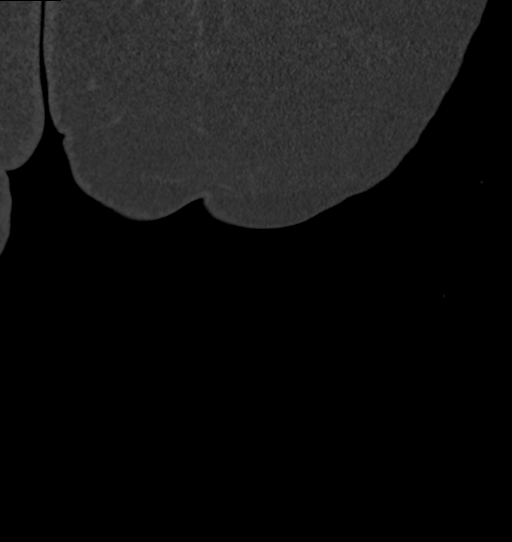
[im 59/146  bone]
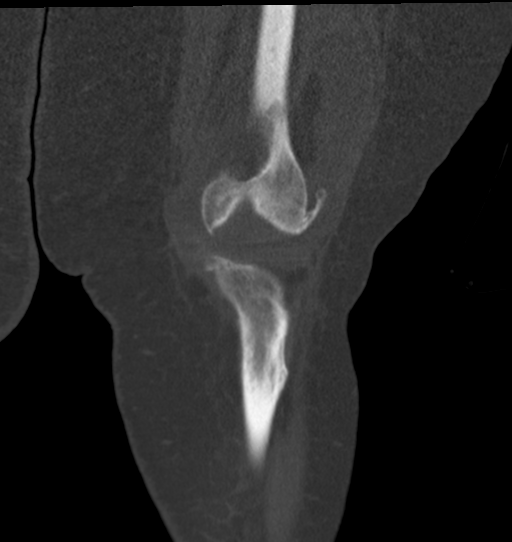
[im 88/146  bone]
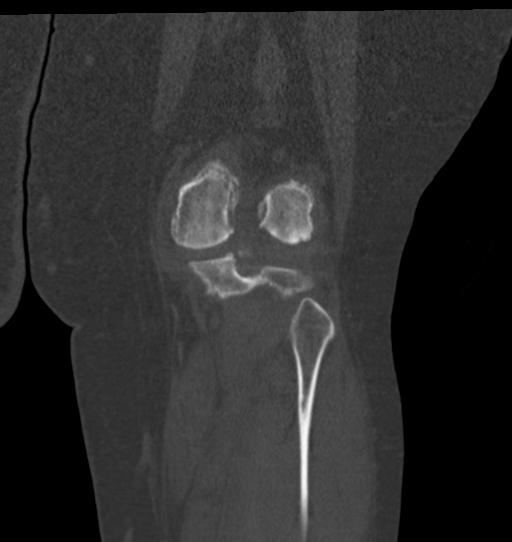

[16 of 33 positions shown; findings below may reference images not displayed]

FINDINGS: Bones/Joint/Cartilage

There is no acute bony abnormality. The patient has advanced for age
tricompartmental osteoarthritis with subchondral sclerosis and
osteophytosis appearing worst about the medial and lateral
compartments. Medial compartment joint space narrowing is noted.
Loose body in the posterior aspect of the joint centrally measures 1
cm in diameter. Mild lateral subluxation of the patella is likely
chronic and due to patellar tracking abnormality. No focal bony
lesion is identified.

Ligaments

Suboptimally assessed by CT.

Muscles and Tendons

Intact.

Soft tissues

Negative.
IMPRESSION: No acute abnormality.

Markedly advanced for age tricompartmental osteoarthritis.

Please note that the menisci and ligaments cannot be adequately
assessed with CT scan.

## 2017-10-20 MED ORDER — ZOLPIDEM TARTRATE 5 MG PO TABS: 5.0000 mg | ORAL_TABLET | Freq: Every day | ORAL | 0 refills | Status: DC

## 2017-10-20 NOTE — Telephone Encounter (Signed)
Rx faxed to [facility] (P) [phone], (F) [phone] 

## 2017-10-20 NOTE — Progress Notes (Signed)
Location:   [facility] Room Number: 102 A Place of Service:  SNF (31)   CODE STATUS: Full Code  Allergies  Allergen Reactions  . Contrast Media [Iodinated Diagnostic Agents] Itching    Ct contrast  . Oxycodone-Acetaminophen Itching    Chief Complaint  Patient presents with  . Acute Visit    Care Plan Meeting    HPI:  We have come together for her care plan meeting. The goal of her care is for her to return back home.  She will be living with her mother in a second story apartment. She will need to go up 15 steps to get into her apartment. She has a walker; wheelchair tub bench and bed side commode. She continues to participate in therapy. One focus will be for her to tolerate the steps. She denies any shortness of breath; no chest pain; no uncontrolled pain. She will need a calender to write her daily weights on. This will help her to manage her chf. There are no nursing concerns at this time.   Past Medical History:  Diagnosis Date  . Acanthosis nigricans   . Anxiety   . Arthritis    "knees" (04/28/2017)  . Asthma    Followed by Dr. Annamaria Boots (pulmonology); receives every other week omalizumab injections; has frequent exacerbations  . COPD (chronic obstructive pulmonary disease) ([place])    PFTs in 2002, FEV1/FVC 65, no post bronchodilater test done  . Depression   . GERD (gastroesophageal reflux disease)   . Headache(784.0)    "q couple days" (04/28/2017)  . Helicobacter pylori (H. pylori) infection   . Hypertension, essential   . Insomnia   . Menorrhagia   . Morbid obesity ([place])   . OSA on CPAP    Sleep study 2008 - mild OSA, not enough events to titrate CPAP; wears CPAP now/pt on 04/28/2017  . Pneumonia X 1  . Seasonal allergies   . Shortness of breath   . Tobacco user     Past Surgical History:  Procedure Laterality Date  . CARDIOVERSION N/A 05/30/2017   Procedure: CARDIOVERSION;  Surgeon: Sanda Klein, MD;  Location: [facility];  Service:  Cardiovascular;  Laterality: N/A;  . REDUCTION MAMMAPLASTY Bilateral 09/2011  . TUBAL LIGATION  1996   bilateral    Social History   Socioeconomic History  . Marital status: Single    Spouse name: Not on file  . Number of children: 1  . Years of education: Not on file  . Highest education level: Not on file  Occupational History  . Occupation: CNA  Social Needs  . Financial resource strain: Not on file  . Food insecurity:    Worry: Not on file    Inability: Not on file  . Transportation needs:    Medical: Not on file    Non-medical: Not on file  Tobacco Use  . Smoking status: Former Smoker    Packs/day: 0.50    Years: 26.00    Pack years: 13.00    Types: Cigarettes    Last attempt to quit: 09/12/2014    Years since quitting: 3.1  . Smokeless tobacco: Never Used  Substance and Sexual Activity  . Alcohol use: No  . Drug use: No  . Sexual activity: Never  Lifestyle  . Physical activity:    Days per week: Not on file    Minutes per session: Not on file  . Stress: Not on file  Relationships  . Social connections:  Talks on phone: Not on file    Gets together: Not on file    Attends religious service: Not on file    Active member of club or organization: Not on file    Attends meetings of clubs or organizations: Not on file    Relationship status: Not on file  . Intimate partner violence:    Fear of current or ex partner: Not on file    Emotionally abused: Not on file    Physically abused: Not on file    Forced sexual activity: Not on file  Other Topics Concern  . 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   Family History  Problem Relation Age of Onset  . Hypertension Mother   . Asthma Daughter   . Cancer Paternal Aunt   . Asthma Maternal Grandmother       VITAL SIGNS BP 112/70   Pulse 85   Temp 98 F (36.7 C)   Resp 18   Ht 5\' 6"  (1.676 m)   Wt 297 lb 9.6 oz (135 kg)   LMP 10/07/2017  (Approximate)   SpO2 97%   BMI 48.03 kg/m   Outpatient Encounter Medications as of 10/17/2017  Medication Sig  . apixaban (ELIQUIS) 5 MG TABS tablet Take 5 mg by mouth 2 (two) times daily.  . diclofenac sodium (VOLTAREN) 1 % GEL Apply 1 application topically 4 (four) times daily.  . fluticasone (FLONASE) 50 MCG/ACT nasal spray Place 1 spray into both nostrils daily.  . Fluticasone-Salmeterol (ADVAIR) 250-50 MCG/DOSE AEPB Inhale 1 puff into the lungs 2 (two) times daily.  . furosemide (LASIX) 40 MG tablet Take 40 mg by mouth daily.  [name] guaiFENesin (MUCINEX) 600 MG 12 hr tablet Take 2 tablets (1,200 mg total) by mouth 2 (two) times daily.  [name] ipratropium-albuterol (DUONEB) 0.5-2.5 (3) MG/3ML SOLN Take 3 mLs by nebulization every 2 (two) hours as needed.  [name] ipratropium-albuterol (DUONEB) 0.5-2.5 (3) MG/3ML SOLN Take 3 mLs by nebulization 2 (two) times daily.  [name] menthol-cetylpyridinium (CEPACOL) 3 MG lozenge Take 1 lozenge by mouth every 4 (four) hours as needed for sore throat.  . montelukast (SINGULAIR) 10 MG tablet Take 10 mg by mouth at bedtime.  . Multiple Vitamin (MULTIVITAMIN WITH MINERALS) TABS tablet Take 1 tablet by mouth daily.  . pantoprazole (PROTONIX) 40 MG tablet Take 40 mg by mouth daily.  . Potassium Chloride ER 20 MEQ TBCR Take 20 mEq by mouth daily.  [name] SALINE NASAL SPRAY NA Place 1 spray into both nostrils every 2 (two) hours as needed.  . sertraline (ZOLOFT) 100 MG tablet Take 100 mg by mouth daily.  . [EXPIRED] amoxicillin-clavulanate (AUGMENTIN) 875-125 MG tablet Take 1 tablet by mouth every 12 (twelve) hours for 3 days.  . [DISCONTINUED] budesonide-formoterol (SYMBICORT) 160-4.5 MCG/ACT inhaler Inhale 2 puffs into the lungs 2 (two) times daily.  . [DISCONTINUED] sodium chloride (OCEAN) 0.65 % SOLN nasal spray Place 1 spray into both nostrils as needed for congestion. (Patient not taking: Reported on 10/20/2017)   No facility-administered encounter medications on file as of  10/17/2017.      SIGNIFICANT DIAGNOSTIC EXAMS  PREVIOUS:   10-08-17: chest x-ray: Cardiomegaly with overall improving aeration. There could be residual LEFT lower lobe infiltrate.  10-08-17: ct of chest: 1. No acute cardiopulmonary disease. 2. Mild bilateral chronic interstitial lung disease.  10-08-17: ct angio of chest: 1. No pulmonary embolus to the segmental level. 2. Lung  parenchymal better assessed on noncontrast CT earlier this day given degree of motion on the current exam. Findings suspicious for interstitial lung disease again seen.  10-11-17: chest x-ray: 1. No definite acute findings. 2. Mild interstitial prominence, as on 10/08/2017.  10-13-17: ct of soft tissue of neck: Noncontrast CT of the neck demonstrates no mass or airway Compromise.  10-14-17: chest x-ray: No acute cardiopulmonary abnormality seen.   NO NEW EXAMS  LABS REVIEWED: PREVIOUS:   10-08-17: wbc 15.8; hgb 12.6; hct 39.1; mcv 84.3; plt 401; glucose 94; bun <5; creat 0.93; k+ 2.7; na++ 138; ca 8.3; mag 1.3; d-dimer: 0.93 10-10-17: wbc 11.5; hgb 10.6; hct 33.6; mcv 83.8; plt 398; glucose 82; bun <5; creat 0.67; k+ 3.7; na++ 139; ca 8.0 10-12-17: wbc 15.4; hgb 10.5; hct 33.1; mcv 83.4; plt 428; glucose 83; bun <0.5; creat 0.6; k+ 3.1; na++ 139; ca 8.0 10-15-17: wbc 17.2; hgb 10.7; hct 33.5; mcv 83.1; plt 451; glucose 87; bun <5; creat 0.67; k+ 3.2; na++ 137; ca 8.2; mag 1.7; phos 5.2; liver normal albumin 2.3   NO NEW LABS.   Review of Systems  Constitutional: Negative for malaise/fatigue.  Respiratory: Negative for cough and shortness of breath.   Cardiovascular: Negative for chest pain, palpitations and leg swelling.  Gastrointestinal: Negative for abdominal pain, constipation and heartburn.  Musculoskeletal: Negative for back pain, joint pain and myalgias.  Skin: Negative.   Neurological: Negative for dizziness.  Psychiatric/Behavioral: The patient is not nervous/anxious.    Physical Exam  Constitutional:  She is oriented to person, place, and time. She appears well-developed and well-nourished. No distress.  Morbid obesity   HENT:  Mouth/Throat: Oropharynx is clear and moist.  Eyes: Conjunctivae are normal.  Neck: No thyromegaly present.  Cardiovascular: Normal rate, regular rhythm and intact distal pulses.  Murmur heard. 1/6  Pulmonary/Chest: Effort normal and breath sounds normal. No respiratory distress.  Abdominal: Soft. Bowel sounds are normal. She exhibits no distension. There is no tenderness.  Musculoskeletal: Normal range of motion. She exhibits no edema.  Lymphadenopathy:    She has no cervical adenopathy.  Neurological: She is alert and oriented to person, place, and time.  Skin: Skin is warm and dry. She is not diaphoretic.  Psychiatric: She has a normal mood and affect.    ASSESSMENT/ PLAN:  TODAY:   1. Chronic diastolic heart failure 2. COPD 3. Physical deconditioning  Will continue therapy as directed Will continue current medications The goals of her care is for discharge to home.   Time spent with patient: 30 minutes: discussed medications; therapy and goals of care. Verbalized understanding.    MD is aware of resident's narcotic use and is in agreement with current plan of care. We will attempt to wean resident as apropriate   Ok Edwards NP [facility]  Contact [phone] Monday through Friday 8am- 5pm  After hours call [phone]

## 2017-10-20 NOTE — Progress Notes (Signed)
Patient ID: April [name], female   DOB: 18-Feb-1971, 47 y.o.   MRN: [id number]   Provider:  DR Arletha Grippe Location:  [place] Room Number: [place] of Service:  SNF (19)  PCP: [name], MD Patient Care Team: [name], MD as PCP - General (Family Medicine) Croitoru, Dani Gobble, MD as PCP - Cardiology (Cardiology)  Extended Emergency Contact Information Primary Emergency Contact: Blakley, Michna Mobile Phone: [phone] Relation: Sister Secondary Emergency Contact: [name],[name] Address: [place], [place] Mobile Phone: [phone] Relation: Mother  Code Status: Full Code Goals of Care: Advanced Directive information Advanced Directives 10/20/2017  Does Patient Have a Medical Advance Directive? No  Would patient like information on creating a medical advance directive? No - Patient declined  Pre-existing out of facility DNR order (yellow form or pink MOST form) -  Some encounter information is confidential and restricted. Go to Review Flowsheets activity to see all data.      Chief Complaint  Patient presents with  . New Admit To SNF    Admission    HPI: Patient is a 47 y.o. female seen today for admission to SNF following hospital stay for pleuritic CP, SOB, viral acute sinusitis, deconditioning, chronic dHF, electrolyte derangement, morbid obesity (BMI 49.55), hx PAF. CTA chest neg PE but findings s/o interstitial lung disease (ILD). Sinusitis tx with augmentin 2/2 elevated WBC. Electrolytes repleted. Prior to this hospital admission, she was d/c'd from [facility] after prolonged stay (08/18/17-09/19/17 -->inpt rehab from 09/19/17-10/02/17) for ARDS (influenza A pos and HCAP) requiring VV ECMO then trach placement and was decannulated 09/24/17; AKI had to be tx with CRRT --> HD x 3 sessions; her permacath was removed 09/03/17.  She presents to SNF for short term rehab.  Today she reports difficulty falling  and staying asleep. Breathing improved. No falls. Appetite reduced. No f/c. No dysphagia. No nursing issues. Pain controlled. SNF labs reviewed - WBC 16.1K with abs neutrophil 9.7K; Hgb 11.4; plts 474K; Na 137; K 3.4; Cr 0.57; BUN 2; glucose 69; Ca 7.6; Mg 1.3; Phos 5.7; albumin 2.5  Chronic diastolic heart failure - nml EF 60-65% (08-04-17); stable on lasix 40 mg daily with k+ 20 meq daily   PAF - rate controlled. She takes eliquis 5 mg twice daily for anticoagulation   COPD - stable on symbicort 160/4.5 mcg 2 puffs twice daily; duoneb twice daily ATC and every 2 hours as needed; mucinex 1200 mg twice daily   Moderate persistent asthma  - stable on singulair daily; flonase daily  GERD  - sable on protonix 40 mg daily   Anxiety and depression - mood stable on zoloft 100 mg daily. She does benefit from this regimen   Cervical back pain with evidence of disc disease - stable on voltaren gel four times daily     Past Medical History:  Diagnosis Date  . Acanthosis nigricans   . Anxiety   . Arthritis    "knees" (04/28/2017)  . Asthma    Followed by Dr. Annamaria Boots (pulmonology); receives every other week omalizumab injections; has frequent exacerbations  . COPD (chronic obstructive pulmonary disease) ([place])    PFTs in 2002, FEV1/FVC 65, no post bronchodilater test done  . Depression   . GERD (gastroesophageal reflux disease)   . Headache(784.0)    "q couple days" (04/28/2017)  . Helicobacter pylori (H. pylori) infection   . Hypertension, essential   .  Insomnia   . Menorrhagia   . Morbid obesity ([place])   . OSA on CPAP    Sleep study 2008 - mild OSA, not enough events to titrate CPAP; wears CPAP now/pt on 04/28/2017  . Pneumonia X 1  . Seasonal allergies   . Shortness of breath   . Tobacco user    Past Surgical History:  Procedure Laterality Date  . CARDIOVERSION N/A 05/30/2017   Procedure: CARDIOVERSION;  Surgeon: Sanda Klein, MD;  Location: [facility];  Service: Cardiovascular;   Laterality: N/A;  . REDUCTION MAMMAPLASTY Bilateral 09/2011  . TUBAL LIGATION  1996   bilateral    reports that she quit smoking about 3 years ago. Her smoking use included cigarettes. She has a 13.00 pack-year smoking history. She has never used smokeless tobacco. She reports that she does not drink alcohol or use drugs. Social History   Socioeconomic History  . Marital status: Single    Spouse name: Not on file  . Number of children: 1  . Years of education: Not on file  . Highest education level: Not on file  Occupational History  . Occupation: CNA  Social Needs  . Financial resource strain: Not on file  . Food insecurity:    Worry: Not on file    Inability: Not on file  . Transportation needs:    Medical: Not on file    Non-medical: Not on file  Tobacco Use  . Smoking status: Former Smoker    Packs/day: 0.50    Years: 26.00    Pack years: 13.00    Types: Cigarettes    Last attempt to quit: 09/12/2014    Years since quitting: 3.1  . Smokeless tobacco: Never Used  Substance and Sexual Activity  . Alcohol use: No  . Drug use: No  . Sexual activity: Never  Lifestyle  . Physical activity:    Days per week: Not on file    Minutes per session: Not on file  . Stress: Not on file  Relationships  . Social connections:    Talks on phone: Not on file    Gets together: Not on file    Attends religious service: Not on file    Active member of club or organization: Not on file    Attends meetings of clubs or organizations: Not on file    Relationship status: Not on file  . Intimate partner violence:    Fear of current or ex partner: Not on file    Emotionally abused: Not on file    Physically abused: Not on file    Forced sexual activity: Not on file  Other Topics Concern  . 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    Functional Status Survey:    Family History  Problem Relation Age of Onset  . Hypertension Mother    . Asthma Daughter   . Cancer Paternal Aunt   . Asthma Maternal Grandmother     Health Maintenance  Topic Date Due  . FOOT EXAM  10/19/2018 (Originally 11/05/1982)  . PAP SMEAR  10/19/2018 (Originally 11/04/1993)  . OPHTHALMOLOGY EXAM  10/19/2018 (Originally 11/05/1982)  . URINE MICROALBUMIN  10/19/2018 (Originally 11/05/1982)  . INFLUENZA VACCINE  11/27/2017  . HEMOGLOBIN A1C  01/07/2018  . TETANUS/TDAP  12/18/2020  . PNEUMOCOCCAL POLYSACCHARIDE VACCINE  Completed  . HIV Screening  Completed    Allergies  Allergen Reactions  . Contrast Media [Iodinated Diagnostic Agents] Itching  Ct contrast  . Oxycodone-Acetaminophen Itching    Outpatient Encounter Medications as of 10/20/2017  Medication Sig  . apixaban (ELIQUIS) 5 MG TABS tablet Take 5 mg by mouth 2 (two) times daily.  . diclofenac sodium (VOLTAREN) 1 % GEL Apply 1 application topically 4 (four) times daily.  . fluticasone (FLONASE) 50 MCG/ACT nasal spray Place 1 spray into both nostrils daily.  . Fluticasone-Salmeterol (ADVAIR) 250-50 MCG/DOSE AEPB Inhale 1 puff into the lungs 2 (two) times daily.  . furosemide (LASIX) 40 MG tablet Take 40 mg by mouth daily.  [name] guaiFENesin (MUCINEX) 600 MG 12 hr tablet Take 2 tablets (1,200 mg total) by mouth 2 (two) times daily.  [name] ipratropium-albuterol (DUONEB) 0.5-2.5 (3) MG/3ML SOLN Take 3 mLs by nebulization every 2 (two) hours as needed.  [name] ipratropium-albuterol (DUONEB) 0.5-2.5 (3) MG/3ML SOLN Take 3 mLs by nebulization 2 (two) times daily.  [name] menthol-cetylpyridinium (CEPACOL) 3 MG lozenge Take 1 lozenge by mouth every 4 (four) hours as needed for sore throat.  . montelukast (SINGULAIR) 10 MG tablet Take 10 mg by mouth at bedtime.  . Multiple Vitamin (MULTIVITAMIN WITH MINERALS) TABS tablet Take 1 tablet by mouth daily.  . pantoprazole (PROTONIX) 40 MG tablet Take 40 mg by mouth daily.  . Potassium Chloride ER 20 MEQ TBCR Take 20 mEq by mouth daily.  [name] SALINE NASAL SPRAY NA Place 1 spray  into both nostrils every 2 (two) hours as needed.  . sertraline (ZOLOFT) 100 MG tablet Take 100 mg by mouth daily.  . [DISCONTINUED] budesonide-formoterol (SYMBICORT) 160-4.5 MCG/ACT inhaler Inhale 2 puffs into the lungs 2 (two) times daily.  . [DISCONTINUED] sodium chloride (OCEAN) 0.65 % SOLN nasal spray Place 1 spray into both nostrils as needed for congestion. (Patient not taking: Reported on 10/20/2017)   No facility-administered encounter medications on file as of 10/20/2017.     Review of Systems  Constitutional: Positive for appetite change (decreased).  Respiratory: Positive for shortness of breath.   Cardiovascular: Positive for chest pain.  Psychiatric/Behavioral: Positive for sleep disturbance. The patient is nervous/anxious.   All other systems reviewed and are negative.   Vitals:   10/20/17 1014  BP: 112/70  Pulse: 85  Resp: 18  Temp: 98 F (36.7 C)  SpO2: 97%  Weight: 297 lb 9.6 oz (135 kg)  Height: 5\' 6"  (1.676 m)   Body mass index is 48.03 kg/m. Physical Exam  Constitutional: She is oriented to person, place, and time. She appears well-developed and well-nourished.  Morbidly obese  HENT:  Mouth/Throat: Oropharynx is clear and moist. No oropharyngeal exudate.  L>R maxillary sinus TTP with boggy tissue texture changes; oropharynx with cobblestoning appearance but no redness; MMM; no oral thrush  Eyes: Pupils are equal, round, and reactive to light. No scleral icterus.  Neck: Neck supple. Carotid bruit is not present. No tracheal deviation present. No thyromegaly present.  Trach scar noted  Cardiovascular: Normal rate, regular rhythm and intact distal pulses. Exam reveals no gallop and no friction rub.  Murmur (1/6 SEM) heard. No LE edema b/l. no calf TTP.   Pulmonary/Chest: Effort normal and breath sounds normal. No stridor. No respiratory distress. She has no wheezes. She has no rales.  Abdominal: Soft. Normal appearance and bowel sounds are normal. She  exhibits no distension and no mass. There is no hepatomegaly. There is no tenderness. There is no rigidity, no rebound and no guarding. No hernia.  obese  Lymphadenopathy:    She has no cervical adenopathy.  Neurological: She is alert and oriented to person, place, and time. She has normal reflexes.  Skin: Skin is warm and dry. No rash noted.  Psychiatric: She has a normal mood and affect. Her behavior is normal. Judgment and thought content normal.    Labs reviewed: Basic Metabolic Panel: Recent Labs    08/17/17 0413 08/18/17 0500  10/13/17 0534 10/14/17 0752 10/15/17 0828  NA 155* 151*   < > 139 137 137  K 3.0* 5.4*   < > 3.1* 3.3* 3.2*  CL 124* 118*   < > 103 101 102  CO2 26 25   < > 25 25 26   GLUCOSE 121* 192*   < > 81 91 87  BUN 44* 77*   < > 5* <5* <5*  CREATININE 0.94 2.24*   < > 0.64 0.66 0.67  CALCIUM 6.4* 7.7*   < > 7.7* 8.0* 8.2*  MG 1.8 2.2   < > 1.1* 1.8 1.7  PHOS 2.1* 4.3  --   --   --  5.2*   < > = values in this interval not displayed.   Liver Function Tests: Recent Labs    04/28/17 0911 08/06/17 2118 10/15/17 0828  AST 21 25 39  ALT 16 28 38  ALKPHOS 72 89 88  BILITOT 0.5 0.6 0.5  PROT 6.1* 6.6 6.1*  ALBUMIN 3.1* 3.2* 2.3*   Recent Labs    03/29/17 1705  LIPASE 20   No results for input(s): AMMONIA in the last 8760 hours. CBC: Recent Labs    10/13/17 0534 10/14/17 0752 10/15/17 0828  WBC 16.1* 18.6* 17.2*  NEUTROABS 10.9* 12.6* 11.2*  HGB 10.4* 11.1* 10.7*  HCT 32.6* 34.9* 33.5*  MCV 82.1 83.5 83.1  PLT 428* 439* 451*   Cardiac Enzymes: Recent Labs    02/11/17 0512 07/29/17 0729 08/11/17 0702  TROPONINI <0.03 <0.03 <0.03   BNP: Invalid input(s): POCBNP Lab Results  Component Value Date   HGBA1C 7.2 (H) 07/07/2017   Lab Results  Component Value Date   TSH 2.470 12/27/2016   Lab Results  Component Value Date   VITAMINB12 310 08/08/2010   Lab Results  Component Value Date   FOLATE  08/08/2010    10.5 (NOTE)   Reference Ranges        Deficient:       0.4 - 3.3 ng/mL        Indeterminate:   3.4 - 5.4 ng/mL        Normal:              > 5.4 ng/mL   Lab Results  Component Value Date   IRON 20 (L) 08/08/2010   TIBC 155 (L) 08/08/2010   FERRITIN 8 (L) 08/08/2010    Imaging and Procedures obtained prior to SNF admission: Dg Chest 2 View  Result Date: 10/08/2017 CLINICAL DATA:  Chest pain with cough and vomiting. EXAM: CHEST - 2 VIEW COMPARISON:  08/18/2017. FINDINGS: Cardiomegaly. Retrocardiac density, noted on the April radiograph, appears to persist although is less prominent. The diffuse airspace opacity noted previously is significantly improved. No bony abnormality. IMPRESSION: Cardiomegaly with overall improving aeration. There could be residual LEFT lower lobe infiltrate. Electronically Signed   By: Staci Righter M.D.   On: 10/08/2017 13:08   Ct Chest Wo Contrast  Result Date: 10/08/2017 CLINICAL DATA:  Worsening chest pain for 2 months EXAM: CT CHEST WITHOUT CONTRAST TECHNIQUE: Multidetector CT imaging of the chest was performed following the standard protocol  without IV contrast. COMPARISON:  None. FINDINGS: Cardiovascular: No significant vascular findings. Normal heart size. No pericardial effusion. Mediastinum/Nodes: No enlarged mediastinal or axillary lymph nodes. Thyroid gland, trachea, and esophagus demonstrate no significant findings. Lungs/Pleura: No focal consolidation. Chronic bilateral upper lobe scarring with mild bronchiectasis. Mild lingular, right middle lobe and right lower lobe scarring. No pleural effusion or pneumothorax. Upper Abdomen: No acute upper abdominal abnormality. Musculoskeletal: No acute osseous abnormality. No aggressive osseous lesion. IMPRESSION: 1. No acute cardiopulmonary disease. 2. Mild bilateral chronic interstitial lung disease. Electronically Signed   By: Kathreen Devoid   On: 10/08/2017 14:44   Ct Angio Chest Pe W Or Wo Contrast  Result Date: 10/08/2017 CLINICAL  DATA:  PE suspected, intermediate prob, positive D-dimer EXAM: CT ANGIOGRAPHY CHEST WITH CONTRAST TECHNIQUE: Multidetector CT imaging of the chest was performed using the standard protocol during bolus administration of intravenous contrast. Multiplanar CT image reconstructions and MIPs were obtained to evaluate the vascular anatomy. CONTRAST:  20mL ISOVUE-370 IOPAMIDOL (ISOVUE-370) INJECTION 76%. Patient pre-medicated prior to the exam due to contrast allergy. No reactions reported. COMPARISON:  Noncontrast CT 7 hours prior. FINDINGS: Cardiovascular: There are no filling defects within the central pulmonary arteries to the segmental level to suggest pulmonary embolus. Subsegmental branches cannot be assessed due to contrast bolus timing, soft tissue attenuation from habitus, and breathing motion artifact. Heart size upper normal. Normal caliber thoracic aorta without dissection. Left vertebral artery arises directly from the aorta, a normal variant. No pericardial effusion. Mediastinum/Nodes: No enlarged mediastinal or hilar lymph nodes. Esophagus is decompressed. Visualized thyroid gland is normal. Lung parenchyma better assessed on CT earlier this day given degree of motion on the current exam. Lungs/Pleura: No confluent consolidation. Subpleural reticulation in the upper lobes with bronchial thickening and mild upper lobe bronchiectasis, better assessed on prior exam. A few pulmonary cysts noted in the lower lobes. Perihilar upper lobe opacities may represent scarring. No evidence of pulmonary edema. No pleural effusion. Trachea and mainstem bronchi are grossly patent allowing for degree motion. Upper Abdomen: Suspected hepatic steatosis.  No acute findings. Musculoskeletal: There are no acute or suspicious osseous abnormalities. Review of the MIP images confirms the above findings. IMPRESSION: 1. No pulmonary embolus to the segmental level. 2. Lung parenchymal better assessed on noncontrast CT earlier this day  given degree of motion on the current exam. Findings suspicious for interstitial lung disease again seen. Electronically Signed   By: Jeb Levering M.D.   On: 10/08/2017 22:18    Assessment/Plan   ICD-10-CM   1. Insomnia due to medical condition G47.01   2. Pleuritic chest pain R07.81   3. Seasonal allergic rhinitis due to pollen J30.1   4. Chronic diastolic CHF (congestive heart failure) (HCC) I50.32   5. Moderate persistent asthma without complication O96.29   6. Physical deconditioning R53.81   7. Anxiety and depression F41.9    F32.9   8. Morbid obesity with BMI of 45.0-49.9, adult ([place]) E66.01    Z68.42   9. Essential hypertension I10   10. Electrolyte disturbance E87.8     START ZOLPIDEM 5MG  PO QHS TO HELP SLEEP X 2 WEEKS THEN D/C  START MELATONIN 3MG  PO QHS FOR INSOMNI  START CETIRIZINE 10MG  DAILY FOR SEASONAL ALLERGY - hopefully this will improve her appetite  START MAG OXIDE 400MG  BID X 7 DAYS  Cont other meds as ordered  PT/OT/ST as ordered  F/u with pulm Dr Joya Gaskins  as scheduled  GOAL: short term rehab and d/c home  when medically appropriate. Communicated with pt and nursing.  Will follow  Labs/tests ordered: repeat CXR in 4 weeks; reck bmp and mg in 10 days    Delila Kuklinski S. Perlie Gold  [facility] [address] [place], [place] [address] [phone] Cell (Monday-Friday 8 AM - 5 PM) [phone] After 5 PM and follow prompts

## 2017-10-20 NOTE — Telephone Encounter (Signed)
Called patient to reschedule appt on Thursday June 27th as NP Wyn Quaker will not be in the office. LMTCB.

## 2017-10-20 NOTE — Progress Notes (Deleted)
Location:  [facility] Room Number: 102 A Place of Service:  SNF (31) Provider:  DR MONICA Melene Plan, MD  Patient Care Team: [name], MD as PCP - General (Family Medicine) Croitoru, Dani Gobble, MD as PCP - Cardiology (Cardiology)  Extended Emergency Contact Information Primary Emergency Contact: Lloyd, Cullinan Mobile Phone: [phone] Relation: Sister Secondary Emergency Contact: [name],[name] Address: [place], [place] Mobile Phone: [phone] Relation: Mother  Code Status:  Full Code Goals of care: Advanced Directive information Advanced Directives 10/20/2017  Does Patient Have a Medical Advance Directive? No  Would patient like information on creating a medical advance directive? No - Patient declined  Pre-existing out of facility DNR order (yellow form or pink MOST form) -  Some encounter information is confidential and restricted. Go to Review Flowsheets activity to see all data.     Chief Complaint  Patient presents with  . New Admit To SNF    Admission    HPI:  Pt is a 47 y.o. female seen today for medical management of chronic diseases.     Past Medical History:  Diagnosis Date  . Acanthosis nigricans   . Anxiety   . Arthritis    "knees" (04/28/2017)  . Asthma    Followed by Dr. Annamaria Boots (pulmonology); receives every other week omalizumab injections; has frequent exacerbations  . COPD (chronic obstructive pulmonary disease) ([place])    PFTs in 2002, FEV1/FVC 65, no post bronchodilater test done  . Depression   . GERD (gastroesophageal reflux disease)   . Headache(784.0)    "q couple days" (04/28/2017)  . Helicobacter pylori (H. pylori) infection   . Hypertension, essential   . Insomnia   . Menorrhagia   . Morbid obesity ([place])   . OSA on CPAP    Sleep study 2008 - mild OSA, not enough events to titrate CPAP; wears CPAP now/pt on 04/28/2017  . Pneumonia X 1  . Seasonal  allergies   . Shortness of breath   . Tobacco user    Past Surgical History:  Procedure Laterality Date  . CARDIOVERSION N/A 05/30/2017   Procedure: CARDIOVERSION;  Surgeon: Sanda Klein, MD;  Location: [facility];  Service: Cardiovascular;  Laterality: N/A;  . REDUCTION MAMMAPLASTY Bilateral 09/2011  . TUBAL LIGATION  1996   bilateral    Allergies  Allergen Reactions  . Contrast Media [Iodinated Diagnostic Agents] Itching    Ct contrast  . Oxycodone-Acetaminophen Itching    Outpatient Encounter Medications as of 10/20/2017  Medication Sig  . apixaban (ELIQUIS) 5 MG TABS tablet Take 5 mg by mouth 2 (two) times daily.  . diclofenac sodium (VOLTAREN) 1 % GEL Apply 1 application topically 4 (four) times daily.  . fluticasone (FLONASE) 50 MCG/ACT nasal spray Place 1 spray into both nostrils daily.  . Fluticasone-Salmeterol (ADVAIR) 250-50 MCG/DOSE AEPB Inhale 1 puff into the lungs 2 (two) times daily.  . furosemide (LASIX) 40 MG tablet Take 40 mg by mouth daily.  [name] guaiFENesin (MUCINEX) 600 MG 12 hr tablet Take 2 tablets (1,200 mg total) by mouth 2 (two) times daily.  [name] ipratropium-albuterol (DUONEB) 0.5-2.5 (3) MG/3ML SOLN Take 3 mLs by nebulization every 2 (two) hours as needed.  [name] ipratropium-albuterol (DUONEB) 0.5-2.5 (3) MG/3ML SOLN Take 3 mLs by nebulization 2 (two) times daily.  [name] menthol-cetylpyridinium (CEPACOL) 3 MG lozenge Take 1 lozenge by mouth every 4 (four) hours as needed for  sore throat.  . montelukast (SINGULAIR) 10 MG tablet Take 10 mg by mouth at bedtime.  . Multiple Vitamin (MULTIVITAMIN WITH MINERALS) TABS tablet Take 1 tablet by mouth daily.  . pantoprazole (PROTONIX) 40 MG tablet Take 40 mg by mouth daily.  . Potassium Chloride ER 20 MEQ TBCR Take 20 mEq by mouth daily.  [name] SALINE NASAL SPRAY NA Place 1 spray into both nostrils every 2 (two) hours as needed.  . sertraline (ZOLOFT) 100 MG tablet Take 100 mg by mouth daily.  . [DISCONTINUED]  budesonide-formoterol (SYMBICORT) 160-4.5 MCG/ACT inhaler Inhale 2 puffs into the lungs 2 (two) times daily.  . [DISCONTINUED] sodium chloride (OCEAN) 0.65 % SOLN nasal spray Place 1 spray into both nostrils as needed for congestion. (Patient not taking: Reported on 10/20/2017)   No facility-administered encounter medications on file as of 10/20/2017.     Review of Systems  Immunization History  Administered Date(s) Administered  . Influenza Split 01/08/2012  . Influenza Whole 03/03/2009, 12/27/2009, 12/19/2010  . Influenza,inj,Quad PF,6+ Mos 12/29/2012, 12/31/2013, 03/07/2015, 01/23/2016, 01/06/2017  . PPD Test 09/25/2011, 07/17/2012, 06/25/2013, 05/21/2016  . Pneumococcal Polysaccharide-23 03/17/2009, 07/06/2016, 10/20/2016  . Tdap 12/19/2010   Pertinent  Health Maintenance Due  Topic Date Due  . FOOT EXAM  10/19/2018 (Originally 11/05/1982)  . PAP SMEAR  10/19/2018 (Originally 11/04/1993)  . OPHTHALMOLOGY EXAM  10/19/2018 (Originally 11/05/1982)  . URINE MICROALBUMIN  10/19/2018 (Originally 11/05/1982)  . INFLUENZA VACCINE  11/27/2017  . HEMOGLOBIN A1C  01/07/2018   Fall Risk  07/18/2017 06/25/2017 02/05/2017 01/15/2017 10/10/2016  Falls in the past year? No No No No No  Number falls in past yr: - - - - -  Injury with Fall? - - - - -  Risk for fall due to : - - - - -  Risk for fall due to: Comment - - - - -  Some encounter information is confidential and restricted. Go to Review Flowsheets activity to see all data.   Functional Status Survey:    Vitals:   10/20/17 1014  BP: 112/70  Pulse: 85  Resp: 18  Temp: 98 F (36.7 C)  SpO2: 97%  Weight: 297 lb 9.6 oz (135 kg)  Height: 5\' 6"  (1.676 m)   Body mass index is 48.03 kg/m. Physical Exam  Labs reviewed: Recent Labs    08/17/17 0413 08/18/17 0500  10/13/17 0534 10/14/17 0752 10/15/17 0828  NA 155* 151*   < > 139 137 137  K 3.0* 5.4*   < > 3.1* 3.3* 3.2*  CL 124* 118*   < > 103 101 102  CO2 26 25   < > 25 25 26     GLUCOSE 121* 192*   < > 81 91 87  BUN 44* 77*   < > 5* <5* <5*  CREATININE 0.94 2.24*   < > 0.64 0.66 0.67  CALCIUM 6.4* 7.7*   < > 7.7* 8.0* 8.2*  MG 1.8 2.2   < > 1.1* 1.8 1.7  PHOS 2.1* 4.3  --   --   --  5.2*   < > = values in this interval not displayed.   Recent Labs    04/28/17 0911 08/06/17 2118 10/15/17 0828  AST 21 25 39  ALT 16 28 38  ALKPHOS 72 89 88  BILITOT 0.5 0.6 0.5  PROT 6.1* 6.6 6.1*  ALBUMIN 3.1* 3.2* 2.3*   Recent Labs    10/13/17 0534 10/14/17 0752 10/15/17 0828  WBC 16.1* 18.6* 17.2*  NEUTROABS 10.9* 12.6* 11.2*  HGB 10.4* 11.1* 10.7*  HCT 32.6* 34.9* 33.5*  MCV 82.1 83.5 83.1  PLT 428* 439* 451*   Lab Results  Component Value Date   TSH 2.470 12/27/2016   Lab Results  Component Value Date   HGBA1C 7.2 (H) 07/07/2017   Lab Results  Component Value Date   CHOL 183 10/06/2009   HDL 47 10/06/2009   LDLCALC 123 (H) 10/06/2009   TRIG 411 (H) 08/14/2017   CHOLHDL 3.9 Ratio 10/06/2009    Significant Diagnostic Results in last 30 days:  Dg Chest 2 View  Result Date: 10/08/2017 CLINICAL DATA:  Chest pain with cough and vomiting. EXAM: CHEST - 2 VIEW COMPARISON:  08/18/2017. FINDINGS: Cardiomegaly. Retrocardiac density, noted on the April radiograph, appears to persist although is less prominent. The diffuse airspace opacity noted previously is significantly improved. No bony abnormality. IMPRESSION: Cardiomegaly with overall improving aeration. There could be residual LEFT lower lobe infiltrate. Electronically Signed   By: Staci Righter M.D.   On: 10/08/2017 13:08   Ct Soft Tissue Neck Wo Contrast  Result Date: 10/13/2017 CLINICAL DATA:  Sore throat.  Contrast allergy. EXAM: CT NECK WITHOUT CONTRAST TECHNIQUE: Multidetector CT imaging of the neck was performed following the standard protocol without intravenous contrast. COMPARISON:  None. FINDINGS: The patient was unable to remain motionless for the exam. Small or subtle lesions could be  overlooked. Pharynx and larynx: Normal. No mass or swelling. Salivary glands: No inflammation, mass or stone. Thyroid: Normal. Lymph nodes: None enlarged or abnormal density. Vascular:   Suspected mild atherosclerosis. Limited intracranial: Negative. Visualized orbits: Negative. Mastoids and visualized paranasal sinuses: Negative. Skeleton: Spondylosis.  Largely edentulous. Upper chest: Respiratory degraded.  No visible pneumothorax. Other: None. IMPRESSION: Noncontrast CT of the neck demonstrates no mass or airway compromise. Electronically Signed   By: Staci Righter M.D.   On: 10/13/2017 12:26   Ct Chest Wo Contrast  Result Date: 10/08/2017 CLINICAL DATA:  Worsening chest pain for 2 months EXAM: CT CHEST WITHOUT CONTRAST TECHNIQUE: Multidetector CT imaging of the chest was performed following the standard protocol without IV contrast. COMPARISON:  None. FINDINGS: Cardiovascular: No significant vascular findings. Normal heart size. No pericardial effusion. Mediastinum/Nodes: No enlarged mediastinal or axillary lymph nodes. Thyroid gland, trachea, and esophagus demonstrate no significant findings. Lungs/Pleura: No focal consolidation. Chronic bilateral upper lobe scarring with mild bronchiectasis. Mild lingular, right middle lobe and right lower lobe scarring. No pleural effusion or pneumothorax. Upper Abdomen: No acute upper abdominal abnormality. Musculoskeletal: No acute osseous abnormality. No aggressive osseous lesion. IMPRESSION: 1. No acute cardiopulmonary disease. 2. Mild bilateral chronic interstitial lung disease. Electronically Signed   By: Kathreen Devoid   On: 10/08/2017 14:44   Ct Angio Chest Pe W Or Wo Contrast  Result Date: 10/08/2017 CLINICAL DATA:  PE suspected, intermediate prob, positive D-dimer EXAM: CT ANGIOGRAPHY CHEST WITH CONTRAST TECHNIQUE: Multidetector CT imaging of the chest was performed using the standard protocol during bolus administration of intravenous contrast. Multiplanar CT  image reconstructions and MIPs were obtained to evaluate the vascular anatomy. CONTRAST:  23mL ISOVUE-370 IOPAMIDOL (ISOVUE-370) INJECTION 76%. Patient pre-medicated prior to the exam due to contrast allergy. No reactions reported. COMPARISON:  Noncontrast CT 7 hours prior. FINDINGS: Cardiovascular: There are no filling defects within the central pulmonary arteries to the segmental level to suggest pulmonary embolus. Subsegmental branches cannot be assessed due to contrast bolus timing, soft tissue attenuation from habitus, and breathing motion artifact. Heart size upper normal. Normal  caliber thoracic aorta without dissection. Left vertebral artery arises directly from the aorta, a normal variant. No pericardial effusion. Mediastinum/Nodes: No enlarged mediastinal or hilar lymph nodes. Esophagus is decompressed. Visualized thyroid gland is normal. Lung parenchyma better assessed on CT earlier this day given degree of motion on the current exam. Lungs/Pleura: No confluent consolidation. Subpleural reticulation in the upper lobes with bronchial thickening and mild upper lobe bronchiectasis, better assessed on prior exam. A few pulmonary cysts noted in the lower lobes. Perihilar upper lobe opacities may represent scarring. No evidence of pulmonary edema. No pleural effusion. Trachea and mainstem bronchi are grossly patent allowing for degree motion. Upper Abdomen: Suspected hepatic steatosis.  No acute findings. Musculoskeletal: There are no acute or suspicious osseous abnormalities. Review of the MIP images confirms the above findings. IMPRESSION: 1. No pulmonary embolus to the segmental level. 2. Lung parenchymal better assessed on noncontrast CT earlier this day given degree of motion on the current exam. Findings suspicious for interstitial lung disease again seen. Electronically Signed   By: Jeb Levering M.D.   On: 10/08/2017 22:18   Dg Chest Port 1 View  Result Date: 10/14/2017 CLINICAL DATA:   Leukocytosis. EXAM: PORTABLE CHEST 1 VIEW COMPARISON:  Radiograph October 11, 2017. FINDINGS: The heart size and mediastinal contours are within normal limits. No pneumothorax or pleural effusion is noted. Lungs are clear. The visualized skeletal structures are unremarkable. IMPRESSION: No acute cardiopulmonary abnormality seen. Electronically Signed   By: Marijo Conception, M.D.   On: 10/14/2017 13:16   Dg Chest Port 1 View  Result Date: 10/11/2017 CLINICAL DATA:  Shortness of breath, cough and congestion for 4 days. Central chest pain. EXAM: PORTABLE CHEST 1 VIEW COMPARISON:  CT chest and chest radiograph 10/08/2017. FINDINGS: Trachea is midline. Heart size stable. Very mild interstitial prominence. No definite airspace consolidation or pleural fluid on this apical lordotic view. IMPRESSION: 1. No definite acute findings. 2. Mild interstitial prominence, as on 10/08/2017. Electronically Signed   By: Lorin Picket M.D.   On: 10/11/2017 08:42    Assessment/Plan    Family/ staff Communication:  Labs/tests ordered:    Cordella Register. Perlie Gold  [facility] [address] [place], [place] [address] [phone] Cell (Monday-Friday 8 AM - 5 PM) [phone] After 5 PM and follow prompts

## 2017-10-23 ENCOUNTER — Encounter: Payer: Self-pay | Admitting: Adult Health

## 2017-10-23 ENCOUNTER — Non-Acute Institutional Stay (SKILLED_NURSING_FACILITY): Payer: Medicaid Other | Admitting: Adult Health

## 2017-10-23 ENCOUNTER — Ambulatory Visit: Payer: Medicaid Other | Admitting: Pulmonary Disease

## 2017-10-23 DIAGNOSIS — I482 Chronic atrial fibrillation, unspecified: Secondary | ICD-10-CM

## 2017-10-23 DIAGNOSIS — I5032 Chronic diastolic (congestive) heart failure: Secondary | ICD-10-CM

## 2017-10-23 DIAGNOSIS — J449 Chronic obstructive pulmonary disease, unspecified: Secondary | ICD-10-CM

## 2017-10-23 NOTE — Progress Notes (Signed)
Location:   [facility] Room Number: 102 A Place of Service:  SNF (31)   CODE STATUS: Full code  Allergies  Allergen Reactions  . Contrast Media [Iodinated Diagnostic Agents] Itching    Ct contrast  . Oxycodone-Acetaminophen Itching    Chief Complaint  Patient presents with  . Medical Management of Chronic Issues    Diastolic heart failure; afib; copd. Weekly follow up for the first 30 days post hospitalization     HPI:  She is a 47 year old short term rehab patient being seen for the management of her chronic illnesses: heart failure; afib; copd. She continues to participate in therapy. She tells me that she is doing well in therapy. She denies any excessive fatigue; no chest pain; no cough or shortness of breath. There are no nursing concerns at this time.   Past Medical History:  Diagnosis Date  . Acanthosis nigricans   . Anxiety   . Arthritis    "knees" (04/28/2017)  . Asthma    Followed by Dr. Annamaria Boots (pulmonology); receives every other week omalizumab injections; has frequent exacerbations  . COPD (chronic obstructive pulmonary disease) ([place])    PFTs in 2002, FEV1/FVC 65, no post bronchodilater test done  . Depression   . GERD (gastroesophageal reflux disease)   . Headache(784.0)    "q couple days" (04/28/2017)  . Helicobacter pylori (H. pylori) infection   . Hypertension, essential   . Insomnia   . Menorrhagia   . Morbid obesity ([place])   . OSA on CPAP    Sleep study 2008 - mild OSA, not enough events to titrate CPAP; wears CPAP now/pt on 04/28/2017  . Pneumonia X 1  . Seasonal allergies   . Shortness of breath   . Tobacco user     Past Surgical History:  Procedure Laterality Date  . CARDIOVERSION N/A 05/30/2017   Procedure: CARDIOVERSION;  Surgeon: Sanda Klein, MD;  Location: [facility];  Service: Cardiovascular;  Laterality: N/A;  . REDUCTION MAMMAPLASTY Bilateral 09/2011  . TUBAL LIGATION  1996   bilateral    Social History    Socioeconomic History  . Marital status: Single    Spouse name: Not on file  . Number of children: 1  . Years of education: Not on file  . Highest education level: Not on file  Occupational History  . Occupation: CNA  Social Needs  . Financial resource strain: Not on file  . Food insecurity:    Worry: Not on file    Inability: Not on file  . Transportation needs:    Medical: Not on file    Non-medical: Not on file  Tobacco Use  . Smoking status: Former Smoker    Packs/day: 0.50    Years: 26.00    Pack years: 13.00    Types: Cigarettes    Last attempt to quit: 09/12/2014    Years since quitting: 3.1  . Smokeless tobacco: Never Used  Substance and Sexual Activity  . Alcohol use: No  . Drug use: No  . Sexual activity: Never  Lifestyle  . Physical activity:    Days per week: Not on file    Minutes per session: Not on file  . Stress: Not on file  Relationships  . Social connections:    Talks on phone: Not on file    Gets together: Not on file    Attends religious service: Not on file    Active member of club or organization: Not on file  Attends meetings of clubs or organizations: Not on file    Relationship status: Not on file  . Intimate partner violence:    Fear of current or ex partner: Not on file    Emotionally abused: Not on file    Physically abused: Not on file    Forced sexual activity: Not on file  Other Topics Concern  . 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   Family History  Problem Relation Age of Onset  . Hypertension Mother   . Asthma Daughter   . Cancer Paternal Aunt   . Asthma Maternal Grandmother       VITAL SIGNS BP 126/80   Pulse 80   Temp 97.6 F (36.4 C)   Resp 20   Ht 5\' 6"  (1.676 m)   Wt 297 lb (134.7 kg)   LMP 10/07/2017 (Approximate)   SpO2 98%   BMI 47.94 kg/m   Outpatient Encounter Medications as of 10/23/2017  Medication Sig  . apixaban (ELIQUIS) 5 MG TABS  tablet Take 5 mg by mouth 2 (two) times daily.  . cetirizine (ZYRTEC) 10 MG tablet Take 10 mg by mouth daily.  . diclofenac sodium (VOLTAREN) 1 % GEL Apply 1 application topically 4 (four) times daily.  . fluticasone (FLONASE) 50 MCG/ACT nasal spray Place 1 spray into both nostrils daily.  . Fluticasone-Salmeterol (ADVAIR) 250-50 MCG/DOSE AEPB Inhale 1 puff into the lungs 2 (two) times daily.  . furosemide (LASIX) 40 MG tablet Take 40 mg by mouth daily.  [name] guaiFENesin (MUCINEX) 600 MG 12 hr tablet Take 2 tablets (1,200 mg total) by mouth 2 (two) times daily.  [name] ipratropium-albuterol (DUONEB) 0.5-2.5 (3) MG/3ML SOLN Take 3 mLs by nebulization every 2 (two) hours as needed.  [name] ipratropium-albuterol (DUONEB) 0.5-2.5 (3) MG/3ML SOLN Take 3 mLs by nebulization 2 (two) times daily.  . magnesium oxide (MAG-OX) 400 MG tablet Take 400 mg by mouth 2 (two) times daily.  . Melatonin 3 MG TABS Take 1 tablet by mouth at bedtime.  [name] menthol-cetylpyridinium (CEPACOL) 3 MG lozenge Take 1 lozenge by mouth every 4 (four) hours as needed for sore throat.  . montelukast (SINGULAIR) 10 MG tablet Take 10 mg by mouth at bedtime.  . Multiple Vitamin (MULTIVITAMIN WITH MINERALS) TABS tablet Take 1 tablet by mouth daily.  . pantoprazole (PROTONIX) 40 MG tablet Take 40 mg by mouth daily.  . Potassium Chloride ER 20 MEQ TBCR Take 20 mEq by mouth daily.  [name] SALINE NASAL SPRAY NA Place 1 spray into both nostrils every 2 (two) hours as needed.  . sertraline (ZOLOFT) 100 MG tablet Take 100 mg by mouth daily.  [name] zolpidem (AMBIEN) 5 MG tablet Take 1 tablet (5 mg total) by mouth at bedtime.   No facility-administered encounter medications on file as of 10/23/2017.      SIGNIFICANT DIAGNOSTIC EXAMS   PREVIOUS:   10-08-17: chest x-ray: Cardiomegaly with overall improving aeration. There could be residual LEFT lower lobe infiltrate.  10-08-17: ct of chest: 1. No acute cardiopulmonary disease. 2. Mild bilateral chronic  interstitial lung disease.  10-08-17: ct angio of chest: 1. No pulmonary embolus to the segmental level. 2. Lung parenchymal better assessed on noncontrast CT earlier this day given degree of motion on the current exam. Findings suspicious for interstitial lung disease again seen.  10-11-17: chest x-ray: 1. No definite acute findings. 2. Mild interstitial prominence, as on 10/08/2017.  10-13-17: ct  of soft tissue of neck: Noncontrast CT of the neck demonstrates no mass or airway Compromise.  10-14-17: chest x-ray: No acute cardiopulmonary abnormality seen.   NO NEW EXAMS  LABS REVIEWED: PREVIOUS:   10-08-17: wbc 15.8; hgb 12.6; hct 39.1; mcv 84.3; plt 401; glucose 94; bun <5; creat 0.93; k+ 2.7; na++ 138; ca 8.3; mag 1.3; d-dimer: 0.93 10-10-17: wbc 11.5; hgb 10.6; hct 33.6; mcv 83.8; plt 398; glucose 82; bun <5; creat 0.67; k+ 3.7; na++ 139; ca 8.0 10-12-17: wbc 15.4; hgb 10.5; hct 33.1; mcv 83.4; plt 428; glucose 83; bun <0.5; creat 0.6; k+ 3.1; na++ 139; ca 8.0 10-15-17: wbc 17.2; hgb 10.7; hct 33.5; mcv 83.1; plt 451; glucose 87; bun <5; creat 0.67; k+ 3.2; na++ 137; ca 8.2; mag 1.7; phos 5.2; liver normal albumin 2.3   TODAY:   10-20-17: wbc 16.1; hgb 11.4; hct 35; plt 474; glucose 69; bun 2; creat 0.6; k+ 3.4; na++ 137; liver normal    Review of Systems  Constitutional: Negative for malaise/fatigue.  Respiratory: Negative for cough and shortness of breath.   Cardiovascular: Negative for chest pain, palpitations and leg swelling.  Gastrointestinal: Negative for abdominal pain, constipation and heartburn.  Musculoskeletal: Negative for back pain, joint pain and myalgias.  Skin: Negative.   Neurological: Negative for dizziness.  Psychiatric/Behavioral: The patient is not nervous/anxious.     Physical Exam  Constitutional: She is oriented to person, place, and time. She appears well-developed and well-nourished. No distress.  Morbid obesity   Neck: No thyromegaly present.   Cardiovascular: Normal rate, regular rhythm and intact distal pulses.  Murmur heard. 1/6  Pulmonary/Chest: Effort normal and breath sounds normal. No respiratory distress.  Abdominal: Soft. Bowel sounds are normal. She exhibits no distension. There is no tenderness.  Musculoskeletal: Normal range of motion. She exhibits no edema.  Lymphadenopathy:    She has no cervical adenopathy.  Neurological: She is alert and oriented to person, place, and time.  Skin: Skin is warm and dry. She is not diaphoretic.  Psychiatric: She has a normal mood and affect.    ASSESSMENT/ PLAN:  TODAY:   1. Chronic diastolic heart failure: stable EF 60-65% (08-04-17): will continue lasix 40 mg daily with k+ 20 meq daily   2.  Chronic atrial fibrillation: heart rate stable: will continue eliquis 5 mg twice daily   3. COPD: stable will continue advair 250/50  twice daily duoneb twice daily and every 2 hours as needed and mucinex 1200 mg twice daily   PREVIOUS  4. Moderate persistent astham with exacerbation: is stable will continue singulair daily; flonase daily; zyrtec 10 mg daily   5. GERD without esophagitis: stable will continue protonix 40 mg daily   6. Anxiety and depression: stable will continue zoloft 100 mg daily is taking melatonin 3 mg nightly and ambien 5 mg nightly for sleep  7. Cervical back pain with evidence of disc disease: is stable is using voltaren gel four times daily     MD is aware of resident's narcotic use and is in agreement with current plan of care. We will attempt to wean resident as apropriate   Ok Edwards NP [facility]  Contact [phone] Monday through Friday 8am- 5pm  After hours call [phone]

## 2017-10-24 DIAGNOSIS — J0100 Acute maxillary sinusitis, unspecified: Secondary | ICD-10-CM | POA: Insufficient documentation

## 2017-10-25 ENCOUNTER — Encounter: Payer: Self-pay | Admitting: Adult Health

## 2017-10-25 DIAGNOSIS — R5381 Other malaise: Secondary | ICD-10-CM | POA: Insufficient documentation

## 2017-10-27 ENCOUNTER — Encounter: Payer: Self-pay | Admitting: Adult Health

## 2017-10-27 ENCOUNTER — Non-Acute Institutional Stay (SKILLED_NURSING_FACILITY): Payer: Medicaid Other | Admitting: Adult Health

## 2017-10-27 DIAGNOSIS — R1319 Other dysphagia: Secondary | ICD-10-CM

## 2017-10-27 DIAGNOSIS — E876 Hypokalemia: Secondary | ICD-10-CM

## 2017-10-27 DIAGNOSIS — R1310 Dysphagia, unspecified: Secondary | ICD-10-CM | POA: Insufficient documentation

## 2017-10-27 LAB — BASIC METABOLIC PANEL
BUN: 4 (ref 4–21)
CREATININE: 0.5 (ref 0.5–1.1)
GLUCOSE: 75
Potassium: 3.2 — AB (ref 3.4–5.3)

## 2017-10-27 NOTE — Progress Notes (Signed)
Location:   [facility] Room Number: 102 A Place of Service:  SNF (31)   CODE STATUS: Full Code  Allergies  Allergen Reactions  . Contrast Media [Iodinated Diagnostic Agents] Itching    Ct contrast  . Oxycodone-Acetaminophen Itching    Chief Complaint  Patient presents with  . Acute Visit    Lab follow up    HPI:  Speech therapy reports that she is spitting out her food; that she has some episodes of vomiting of of big hunks of food. We spoke at great length she will spit out food that she does not like. She states that she does chew her food. She does feel like at times food is getting stuck in her throat. There are no reports of aspiration present. No reports of fevers present. Her k+ and mag levels are low.   Past Medical History:  Diagnosis Date  . Acanthosis nigricans   . Anxiety   . Arthritis    "knees" (04/28/2017)  . Asthma    Followed by Dr. Annamaria Boots (pulmonology); receives every other week omalizumab injections; has frequent exacerbations  . Chronic diastolic CHF (congestive heart failure) ([place]) 01/17/2017  . COPD (chronic obstructive pulmonary disease) ([place])    PFTs in 2002, FEV1/FVC 65, no post bronchodilater test done  . Depression   . GERD (gastroesophageal reflux disease)   . Headache(784.0)    "q couple days" (04/28/2017)  . Helicobacter pylori (H. pylori) infection   . Hypertension, essential   . Insomnia   . Menorrhagia   . Morbid obesity ([place])   . OSA on CPAP    Sleep study 2008 - mild OSA, not enough events to titrate CPAP; wears CPAP now/pt on 04/28/2017  . Pneumonia X 1  . Seasonal allergies   . Shortness of breath   . Tobacco user     Past Surgical History:  Procedure Laterality Date  . CARDIOVERSION N/A 05/30/2017   Procedure: CARDIOVERSION;  Surgeon: Sanda Klein, MD;  Location: [place] [facility];  Service: Cardiovascular;  Laterality: N/A;  . REDUCTION MAMMAPLASTY Bilateral 09/2011  . TUBAL LIGATION  1996   bilateral     Social History   Socioeconomic History  . Marital status: Single    Spouse name: Not on file  . Number of children: 1  . Years of education: Not on file  . Highest education level: Not on file  Occupational History  . Occupation: CNA  Social Needs  . Financial resource strain: Not on file  . Food insecurity:    Worry: Not on file    Inability: Not on file  . Transportation needs:    Medical: Not on file    Non-medical: Not on file  Tobacco Use  . Smoking status: Former Smoker    Packs/day: 0.50    Years: 26.00    Pack years: 13.00    Types: Cigarettes    Last attempt to quit: 09/12/2014    Years since quitting: 3.1  . Smokeless tobacco: Never Used  Substance and Sexual Activity  . Alcohol use: No  . Drug use: No  . Sexual activity: Never  Lifestyle  . Physical activity:    Days per week: Not on file    Minutes per session: Not on file  . Stress: Not on file  Relationships  . Social connections:    Talks on phone: Not on file    Gets together: Not on file    Attends religious service: Not on file  Active member of club or organization: Not on file    Attends meetings of clubs or organizations: Not on file    Relationship status: Not on file  . Intimate partner violence:    Fear of current or ex partner: Not on file    Emotionally abused: Not on file    Physically abused: Not on file    Forced sexual activity: Not on file  Other Topics Concern  . 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   Family History  Problem Relation Age of Onset  . Hypertension Mother   . Asthma Daughter   . Cancer Paternal Aunt   . Asthma Maternal Grandmother       VITAL SIGNS BP 136/75   Pulse 80   Temp 98 F (36.7 C)   Resp 18   Ht 5\' 6"  (1.676 m)   Wt 296 lb 6.4 oz (134.4 kg)   LMP 10/07/2017 (Approximate)   SpO2 97%   BMI 47.84 kg/m   Outpatient Encounter Medications as of 10/27/2017  Medication Sig  . apixaban  (ELIQUIS) 5 MG TABS tablet Take 5 mg by mouth 2 (two) times daily.  . cetirizine (ZYRTEC) 10 MG tablet Take 10 mg by mouth daily.  . diclofenac sodium (VOLTAREN) 1 % GEL Apply 1 application topically 4 (four) times daily.  . fluticasone (FLONASE) 50 MCG/ACT nasal spray Place 1 spray into both nostrils daily.  . Fluticasone-Salmeterol (ADVAIR) 250-50 MCG/DOSE AEPB Inhale 1 puff into the lungs 2 (two) times daily.  . furosemide (LASIX) 40 MG tablet Take 40 mg by mouth daily.  [name] guaiFENesin (MUCINEX) 600 MG 12 hr tablet Take 2 tablets (1,200 mg total) by mouth 2 (two) times daily.  [name] ipratropium-albuterol (DUONEB) 0.5-2.5 (3) MG/3ML SOLN Take 3 mLs by nebulization every 2 (two) hours as needed.  [name] ipratropium-albuterol (DUONEB) 0.5-2.5 (3) MG/3ML SOLN Take 3 mLs by nebulization 2 (two) times daily.  . magnesium oxide (MAG-OX) 400 MG tablet Take 400 mg by mouth 2 (two) times daily.  . Melatonin 3 MG TABS Take 1 tablet by mouth at bedtime.  [name] menthol-cetylpyridinium (CEPACOL) 3 MG lozenge Take 1 lozenge by mouth every 4 (four) hours as needed for sore throat.  . montelukast (SINGULAIR) 10 MG tablet Take 10 mg by mouth at bedtime.  . Multiple Vitamin (MULTIVITAMIN WITH MINERALS) TABS tablet Take 1 tablet by mouth daily.  . pantoprazole (PROTONIX) 40 MG tablet Take 40 mg by mouth daily.  . Potassium Chloride ER 20 MEQ TBCR Take 20 mEq by mouth daily.  [name] SALINE NASAL SPRAY NA Place 1 spray into both nostrils every 2 (two) hours as needed.  . sertraline (ZOLOFT) 100 MG tablet Take 100 mg by mouth daily.  [name] zolpidem (AMBIEN) 5 MG tablet Take 1 tablet (5 mg total) by mouth at bedtime.   No facility-administered encounter medications on file as of 10/27/2017.      SIGNIFICANT DIAGNOSTIC EXAMS  PREVIOUS:   10-08-17: chest x-ray: Cardiomegaly with overall improving aeration. There could be residual LEFT lower lobe infiltrate.  10-08-17: ct of chest: 1. No acute cardiopulmonary disease. 2. Mild bilateral  chronic interstitial lung disease.  10-08-17: ct angio of chest: 1. No pulmonary embolus to the segmental level. 2. Lung parenchymal better assessed on noncontrast CT earlier this day given degree of motion on the current exam. Findings suspicious for interstitial lung disease again seen.  10-11-17: chest x-ray: 1. No  definite acute findings. 2. Mild interstitial prominence, as on 10/08/2017.  10-13-17: ct of soft tissue of neck: Noncontrast CT of the neck demonstrates no mass or airway Compromise.  10-14-17: chest x-ray: No acute cardiopulmonary abnormality seen.   NO NEW EXAMS  LABS REVIEWED: PREVIOUS:   10-08-17: wbc 15.8; hgb 12.6; hct 39.1; mcv 84.3; plt 401; glucose 94; bun <5; creat 0.93; k+ 2.7; na++ 138; ca 8.3; mag 1.3; d-dimer: 0.93 10-10-17: wbc 11.5; hgb 10.6; hct 33.6; mcv 83.8; plt 398; glucose 82; bun <5; creat 0.67; k+ 3.7; na++ 139; ca 8.0 10-12-17: wbc 15.4; hgb 10.5; hct 33.1; mcv 83.4; plt 428; glucose 83; bun <0.5; creat 0.6; k+ 3.1; na++ 139; ca 8.0 10-15-17: wbc 17.2; hgb 10.7; hct 33.5; mcv 83.1; plt 451; glucose 87; bun <5; creat 0.67; k+ 3.2; na++ 137; ca 8.2; mag 1.7; phos 5.2; liver normal albumin 2.3  10-20-17: wbc 16.1; hgb 11.4; hct 35; plt 474; glucose 69; bun 2; creat 0.6; k+ 3.4; na++ 137; liver normal   TODAY:  10-27-17: glucose 75; bun 4.1; k+ 3.2; na++ 139; ca 8.0; mag 1.5   Review of Systems  Constitutional: Negative for malaise/fatigue.  Respiratory: Negative for cough and shortness of breath.   Cardiovascular: Negative for chest pain, palpitations and leg swelling.  Gastrointestinal: Negative for abdominal pain, constipation and heartburn.  Musculoskeletal: Negative for back pain, joint pain and myalgias.  Skin: Negative.   Neurological: Negative for dizziness.  Psychiatric/Behavioral: The patient is not nervous/anxious.   ' Physical Exam  Constitutional: She is oriented to person, place, and time. She appears well-developed and well-nourished. No  distress.  Morbid obesity   Neck: No thyromegaly present.  Cardiovascular: Normal rate, regular rhythm and intact distal pulses.  Murmur heard. 1/6  Pulmonary/Chest: Effort normal and breath sounds normal. No respiratory distress.  Abdominal: Soft. Bowel sounds are normal. She exhibits no distension. There is no tenderness.  Musculoskeletal: Normal range of motion. She exhibits no edema.  Lymphadenopathy:    She has no cervical adenopathy.  Neurological: She is alert and oriented to person, place, and time.  Skin: Skin is warm and dry. She is not diaphoretic.  Psychiatric: She has a normal mood and affect.     Physical Exam  Constitutional: She is oriented to person, place, and time. She appears well-developed and well-nourished. No distress.  Morbid obesity   Neck: No thyromegaly present.  Cardiovascular: Normal rate, regular rhythm and intact distal pulses.  Murmur heard. 1/6  Pulmonary/Chest: Effort normal and breath sounds normal. No respiratory distress.  Abdominal: Soft. Bowel sounds are normal. She exhibits no distension. There is no tenderness.  Musculoskeletal: Normal range of motion. She exhibits no edema.  Lymphadenopathy:    She has no cervical adenopathy.  Neurological: She is alert and oriented to person, place, and time.  Skin: Skin is warm and dry. She is not diaphoretic.  Psychiatric: She has a normal mood and affect.    ASSESSMENT/ PLAN:  TODAY:   1. Hypokalemia: worse 2. Hypomagnesemia: worse 3. Dysphagia  Will change k+ to 20 meq twice daily  Will increase mag ox 400 mg to three times daily Will repeat k+ and mag level in one week Will setup barium swallow      MD is aware of resident's narcotic use and is in agreement with current plan of care. We will attempt to wean resident as apropriate   Ok Edwards NP [facility]  Contact [phone] Monday through Friday 8am- 5pm  After hours call [phone]

## 2017-10-28 ENCOUNTER — Telehealth: Payer: Self-pay | Admitting: Gastroenterology

## 2017-10-28 NOTE — Telephone Encounter (Signed)
I think there may be some misunderstanding on the part of the referring care team.  I read yesterday's note from Bard Herbert, NP.  She wanted a barium swallow study arranged.  That is something their team should call and arrange with radiology through central scheduling.  That does not require an office visit with the GI practice to arrange. I have copied this to the referring provider so they can make those arrangements.  If there are findings on the barium study that her care team feels require our attention/consult, she can then be referred to [place] for an office consult.  If that becomes necessary, a caregiver and (if applicable) the person who acts as the patient's health care power of attorney to make medical decisions must be present for the office visit.  This is required in case consent is needed for an endoscopic procedure.

## 2017-10-29 ENCOUNTER — Other Ambulatory Visit: Payer: Self-pay | Admitting: Adult Health

## 2017-10-29 DIAGNOSIS — R1310 Dysphagia, unspecified: Secondary | ICD-10-CM

## 2017-10-29 NOTE — Telephone Encounter (Signed)
Dr. Loletha Carrow I apologize for the referral. I will speak with the staff at [facility]. I meant for them to schedule the barium swallow before we proceed further.  Thanks Ricard Dillon

## 2017-10-29 NOTE — Telephone Encounter (Signed)
No trouble at all, that's why we're a team. - HD

## 2017-10-30 ENCOUNTER — Non-Acute Institutional Stay (SKILLED_NURSING_FACILITY): Payer: Medicaid Other | Admitting: Adult Health

## 2017-10-30 DIAGNOSIS — F330 Major depressive disorder, recurrent, mild: Secondary | ICD-10-CM

## 2017-10-30 DIAGNOSIS — K219 Gastro-esophageal reflux disease without esophagitis: Secondary | ICD-10-CM

## 2017-10-30 DIAGNOSIS — J4541 Moderate persistent asthma with (acute) exacerbation: Secondary | ICD-10-CM

## 2017-10-31 ENCOUNTER — Encounter: Payer: Self-pay | Admitting: Adult Health

## 2017-10-31 NOTE — Progress Notes (Signed)
Location:   [facility] Room Number: 102 A Place of Service:  SNF (31)   CODE STATUS: Full Code  Allergies  Allergen Reactions  . Contrast Media [Iodinated Diagnostic Agents] Itching    Ct contrast  . Oxycodone-Acetaminophen Itching    Chief Complaint  Patient presents with  . Medical Management of Chronic Issues    Asthma; gerd; depression. Weekly follow up for the first 30 days post hospitalization.     HPI:  She is a 47 year old short term rehab patient being seen for the management of her chronic illnesses: asthma; gerd; depression. Her barium swallow is pending. There are signs of aspiration. She denies any cough or shortness of breath or heart burn. There are no nursing concerns at this time.   Past Medical History:  Diagnosis Date  . Acanthosis nigricans   . Anxiety   . Arthritis    "knees" (04/28/2017)  . Asthma    Followed by Dr. Annamaria Boots (pulmonology); receives every other week omalizumab injections; has frequent exacerbations  . Chronic diastolic CHF (congestive heart failure) ([place]) 01/17/2017  . COPD (chronic obstructive pulmonary disease) ([place])    PFTs in 2002, FEV1/FVC 65, no post bronchodilater test done  . Depression   . GERD (gastroesophageal reflux disease)   . Headache(784.0)    "q couple days" (04/28/2017)  . Helicobacter pylori (H. pylori) infection   . Hypertension, essential   . Insomnia   . Menorrhagia   . Morbid obesity ([place])   . OSA on CPAP    Sleep study 2008 - mild OSA, not enough events to titrate CPAP; wears CPAP now/pt on 04/28/2017  . Pneumonia X 1  . Seasonal allergies   . Shortness of breath   . Tobacco user     Past Surgical History:  Procedure Laterality Date  . CARDIOVERSION N/A 05/30/2017   Procedure: CARDIOVERSION;  Surgeon: Sanda Klein, MD;  Location: [facility];  Service: Cardiovascular;  Laterality: N/A;  . REDUCTION MAMMAPLASTY Bilateral 09/2011  . TUBAL LIGATION  1996   bilateral    Social  History   Socioeconomic History  . Marital status: Single    Spouse name: Not on file  . Number of children: 1  . Years of education: Not on file  . Highest education level: Not on file  Occupational History  . Occupation: CNA  Social Needs  . Financial resource strain: Not on file  . Food insecurity:    Worry: Not on file    Inability: Not on file  . Transportation needs:    Medical: Not on file    Non-medical: Not on file  Tobacco Use  . Smoking status: Former Smoker    Packs/day: 0.50    Years: 26.00    Pack years: 13.00    Types: Cigarettes    Last attempt to quit: 09/12/2014    Years since quitting: 3.1  . Smokeless tobacco: Never Used  Substance and Sexual Activity  . Alcohol use: No  . Drug use: No  . Sexual activity: Never  Lifestyle  . Physical activity:    Days per week: Not on file    Minutes per session: Not on file  . Stress: Not on file  Relationships  . Social connections:    Talks on phone: Not on file    Gets together: Not on file    Attends religious service: Not on file    Active member of club or organization: Not on file  Attends meetings of clubs or organizations: Not on file    Relationship status: Not on file  . Intimate partner violence:    Fear of current or ex partner: Not on file    Emotionally abused: Not on file    Physically abused: Not on file    Forced sexual activity: Not on file  Other Topics Concern  . 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   Family History  Problem Relation Age of Onset  . Hypertension Mother   . Asthma Daughter   . Cancer Paternal Aunt   . Asthma Maternal Grandmother       VITAL SIGNS BP 136/72   Pulse 86   Temp 97.8 F (36.6 C)   Resp 20   Ht 5\' 4"  (1.626 m)   Wt 297 lb 14.4 oz (135.1 kg)   LMP 10/07/2017 (Approximate)   SpO2 97%   BMI 51.13 kg/m   Outpatient Encounter Medications as of 10/30/2017  Medication Sig  . apixaban (ELIQUIS)  5 MG TABS tablet Take 5 mg by mouth 2 (two) times daily.  . cetirizine (ZYRTEC) 10 MG tablet Take 10 mg by mouth daily.  . diclofenac sodium (VOLTAREN) 1 % GEL Apply 1 application topically 4 (four) times daily.  . fluticasone (FLONASE) 50 MCG/ACT nasal spray Place 1 spray into both nostrils daily.  . Fluticasone-Salmeterol (ADVAIR) 250-50 MCG/DOSE AEPB Inhale 1 puff into the lungs 2 (two) times daily.  . furosemide (LASIX) 40 MG tablet Take 40 mg by mouth daily.  [name] guaiFENesin (MUCINEX) 600 MG 12 hr tablet Take 2 tablets (1,200 mg total) by mouth 2 (two) times daily.  [name] ipratropium-albuterol (DUONEB) 0.5-2.5 (3) MG/3ML SOLN Take 3 mLs by nebulization every 2 (two) hours as needed.  [name] ipratropium-albuterol (DUONEB) 0.5-2.5 (3) MG/3ML SOLN Take 3 mLs by nebulization 2 (two) times daily.  . magnesium oxide (MAG-OX) 400 MG tablet Take 400 mg by mouth 2 (two) times daily.  . Melatonin 3 MG TABS Take 1 tablet by mouth at bedtime.  [name] menthol-cetylpyridinium (CEPACOL) 3 MG lozenge Take 1 lozenge by mouth every 4 (four) hours as needed for sore throat.  . montelukast (SINGULAIR) 10 MG tablet Take 10 mg by mouth at bedtime.  . Multiple Vitamin (MULTIVITAMIN WITH MINERALS) TABS tablet Take 1 tablet by mouth daily.  . pantoprazole (PROTONIX) 40 MG tablet Take 40 mg by mouth daily.  . Potassium Chloride ER 20 MEQ TBCR Take 20 mEq by mouth daily.  [name] SALINE NASAL SPRAY NA Place 1 spray into both nostrils every 2 (two) hours as needed.  . sertraline (ZOLOFT) 100 MG tablet Take 100 mg by mouth daily.  [name] zolpidem (AMBIEN) 5 MG tablet Take 1 tablet (5 mg total) by mouth at bedtime.   No facility-administered encounter medications on file as of 10/30/2017.      SIGNIFICANT DIAGNOSTIC EXAMS  PREVIOUS:   10-08-17: chest x-ray: Cardiomegaly with overall improving aeration. There could be residual LEFT lower lobe infiltrate.  10-08-17: ct of chest: 1. No acute cardiopulmonary disease. 2. Mild bilateral chronic  interstitial lung disease.  10-08-17: ct angio of chest: 1. No pulmonary embolus to the segmental level. 2. Lung parenchymal better assessed on noncontrast CT earlier this day given degree of motion on the current exam. Findings suspicious for interstitial lung disease again seen.  10-11-17: chest x-ray: 1. No definite acute findings. 2. Mild interstitial prominence, as on 10/08/2017.  10-13-17:  ct of soft tissue of neck: Noncontrast CT of the neck demonstrates no mass or airway Compromise.  10-14-17: chest x-ray: No acute cardiopulmonary abnormality seen.   NO NEW EXAMS  LABS REVIEWED: PREVIOUS:   10-08-17: wbc 15.8; hgb 12.6; hct 39.1; mcv 84.3; plt 401; glucose 94; bun <5; creat 0.93; k+ 2.7; na++ 138; ca 8.3; mag 1.3; d-dimer: 0.93 10-10-17: wbc 11.5; hgb 10.6; hct 33.6; mcv 83.8; plt 398; glucose 82; bun <5; creat 0.67; k+ 3.7; na++ 139; ca 8.0 10-12-17: wbc 15.4; hgb 10.5; hct 33.1; mcv 83.4; plt 428; glucose 83; bun <0.5; creat 0.6; k+ 3.1; na++ 139; ca 8.0 10-15-17: wbc 17.2; hgb 10.7; hct 33.5; mcv 83.1; plt 451; glucose 87; bun <5; creat 0.67; k+ 3.2; na++ 137; ca 8.2; mag 1.7; phos 5.2; liver normal albumin 2.3  10-20-17: wbc 16.1; hgb 11.4; hct 35; plt 474; glucose 69; bun 2; creat 0.6; k+ 3.4; na++ 137; liver normal  10-27-17: glucose 75; bun 4.1; k+ 3.2; na++ 139; ca 8.0; mag 1.5   NO NEW LABS    Review of Systems  Constitutional: Negative for malaise/fatigue.  Respiratory: Negative for cough and shortness of breath.   Cardiovascular: Negative for chest pain, palpitations and leg swelling.  Gastrointestinal: Negative for abdominal pain, constipation and heartburn.  Musculoskeletal: Negative for back pain, joint pain and myalgias.  Skin: Negative.   Neurological: Negative for dizziness.  Psychiatric/Behavioral: The patient is not nervous/anxious.     Physical Exam  Constitutional: She is oriented to person, place, and time. She appears well-developed and well-nourished. No  distress.  Morbid obesity   Neck: No thyromegaly present.  Cardiovascular: Normal rate, regular rhythm and intact distal pulses.  Murmur heard. 1/6  Pulmonary/Chest: Effort normal and breath sounds normal. No respiratory distress.  Abdominal: Soft. Bowel sounds are normal. She exhibits no distension. There is no tenderness.  Musculoskeletal: Normal range of motion. She exhibits no edema.  Lymphadenopathy:    She has no cervical adenopathy.  Neurological: She is alert and oriented to person, place, and time.  Skin: Skin is warm and dry. She is not diaphoretic.  Psychiatric: She has a normal mood and affect.      ASSESSMENT/ PLAN:  TODAY:   1. Moderate persistent astham with exacerbation: is stable will continue singulair daily; flonase daily; zyrtec 10 mg daily   2. GERD without esophagitis: stable will continue protonix 40 mg daily   3. Mild episode of recurrent major depressive disorder: stable will continue zoloft 100 mg daily is taking melatonin 3 mg nightly and ambien 5 mg nightly for sleep   PREVIOUS  4. Cervical back pain with evidence of disc disease: is stable is using voltaren gel four times daily   5. Hypomagnesemia: without change mag 1.5: will continue mag ox three times daily   6. Chronic diastolic heart failure: stable EF 60-65% (08-04-17): will continue lasix 40 mg daily with k+ 20 meq twice daily is due for follow up k+ level lab work   7.  Chronic atrial fibrillation: heart rate stable: will continue eliquis 5 mg twice daily   8. COPD: stable will continue advair 250/50  twice daily duoneb twice daily and every 2 hours as needed and mucinex 1200 mg twice daily    Is due for her barium swallow.    MD is aware of resident's narcotic use and is in agreement with current plan of care. We will attempt to wean resident as apropriate   Ok Edwards NP [facility]  Adult Medicine  Contact [phone] Monday through Friday 8am- 5pm  After hours call [phone]

## 2017-11-03 LAB — BASIC METABOLIC PANEL
BUN: 2 — AB (ref 4–21)
CREATININE: 0.6 (ref 0.5–1.1)
GLUCOSE: 92
POTASSIUM: 4.2 (ref 3.4–5.3)
Potassium: 4.3 (ref 3.4–5.3)
Sodium: 139 (ref 137–147)

## 2017-11-03 LAB — CBC AND DIFFERENTIAL
HCT: 34 — AB (ref 36–46)
HEMOGLOBIN: 10.7 — AB (ref 12.0–16.0)
Neutrophils Absolute: 10
Platelets: 424 — AB (ref 150–399)
WBC: 14.5

## 2017-11-03 IMAGING — CR DG CHEST 2V
2 series · 2 of 2 positions shown · non-contrast
Comparison: Chest radiograph performed 09/30/2016, and CTA of the
chest performed 10/02/2016

CLINICAL DATA: Acute onset of shortness of breath, dizziness and
nausea. Initial encounter.

EXAM:
CHEST  2 VIEW

[chest pa]
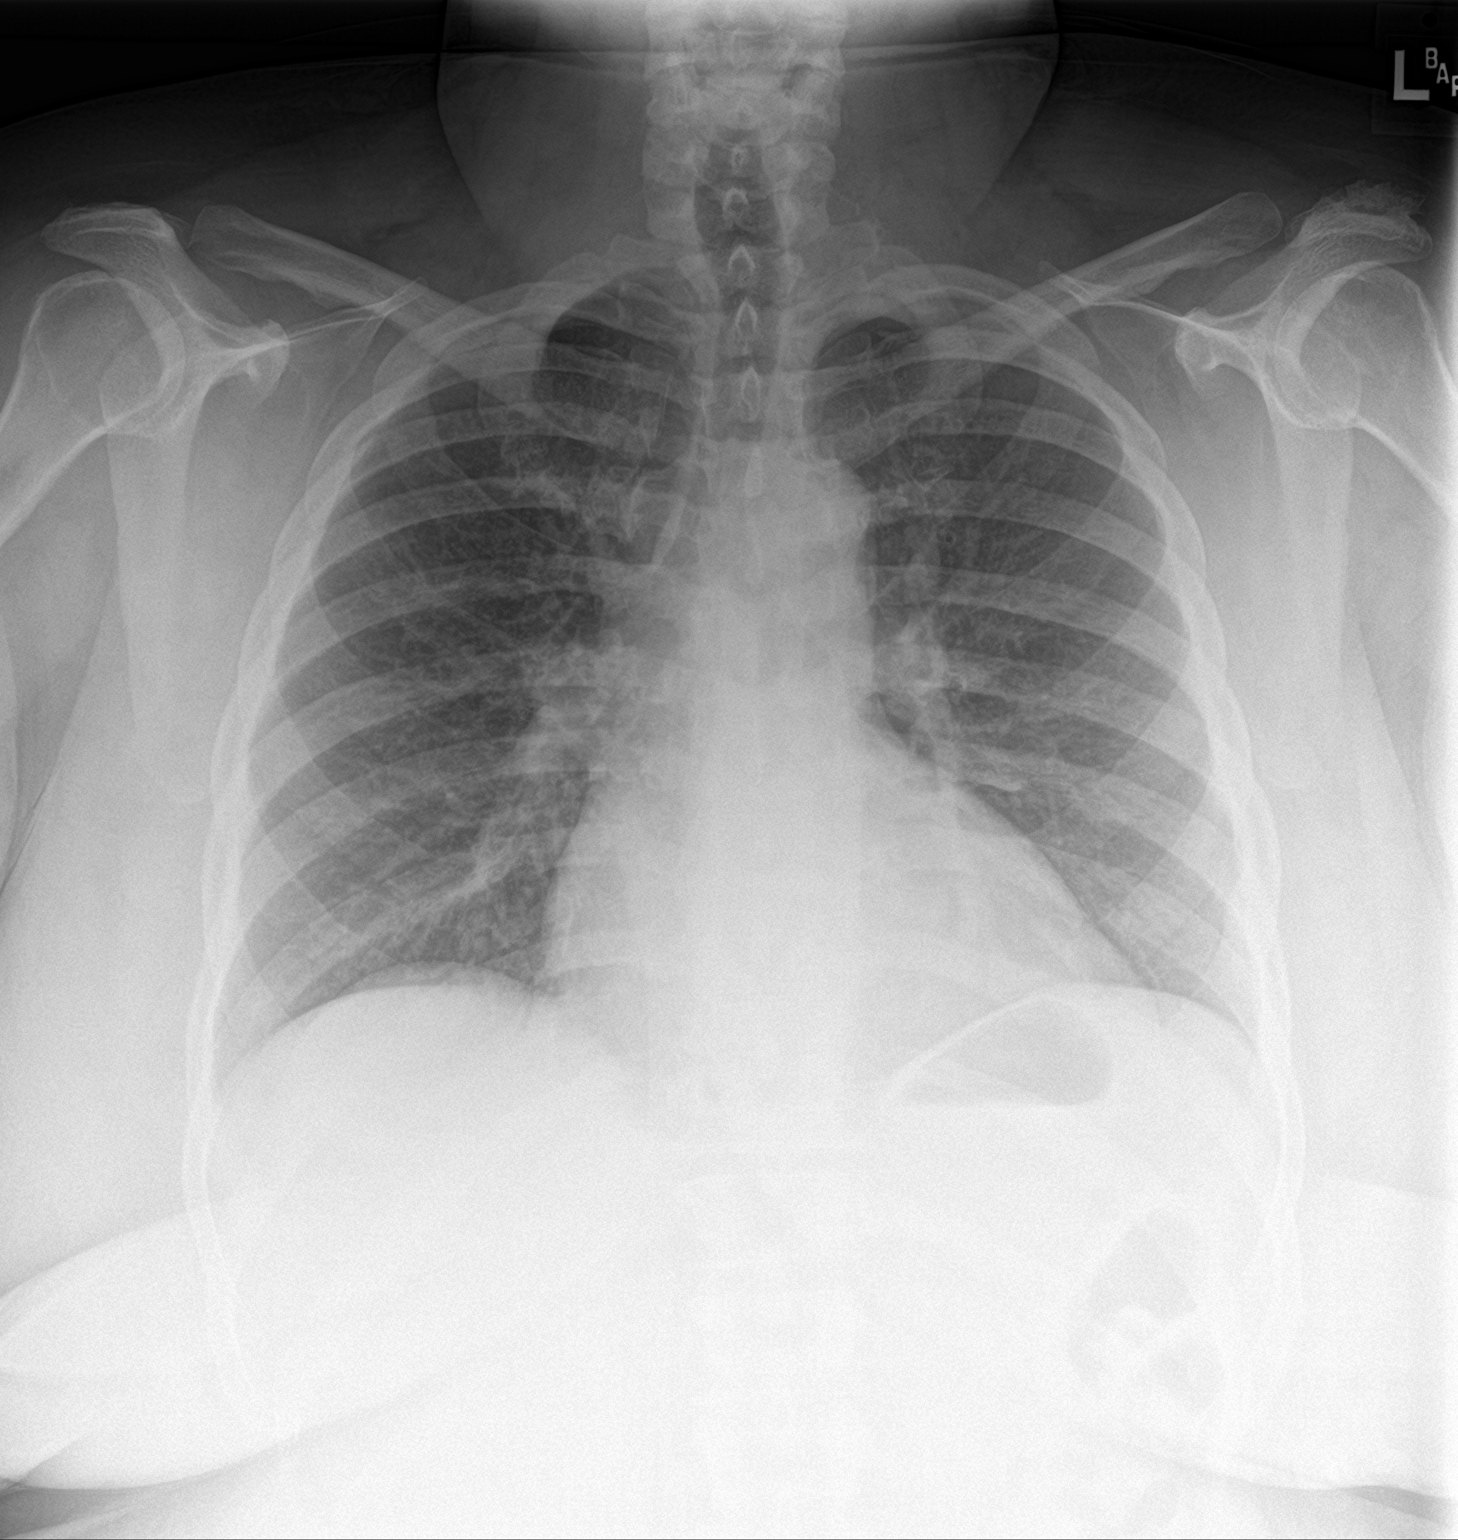

[chest lat]
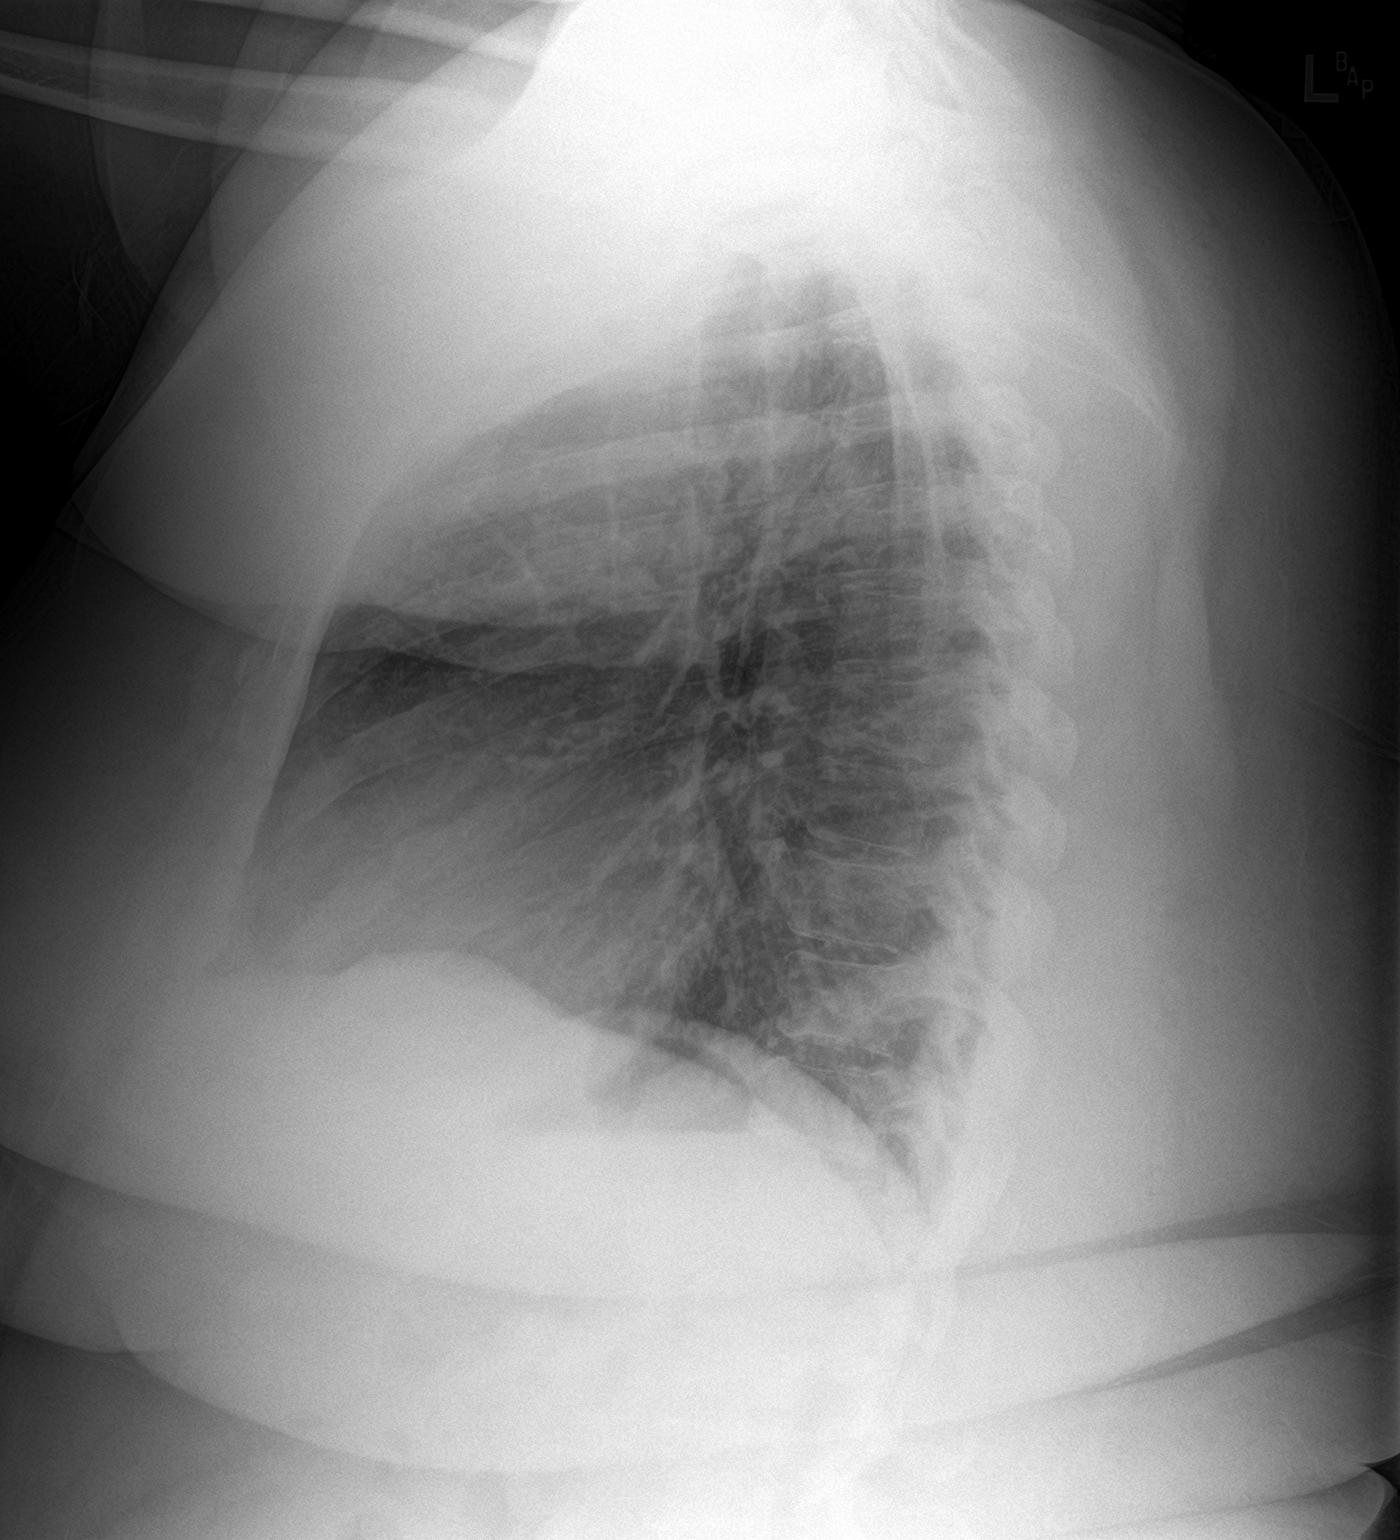

[2 of 2 positions shown; findings below may reference images not displayed]

FINDINGS: The lungs are well-aerated and clear. There is no evidence of focal
opacification, pleural effusion or pneumothorax.

The heart is borderline normal in size. No acute osseous
abnormalities are seen.
IMPRESSION: No acute cardiopulmonary process seen.

## 2017-11-04 ENCOUNTER — Ambulatory Visit (HOSPITAL_COMMUNITY)
Admission: RE | Admit: 2017-11-04 | Discharge: 2017-11-04 | Disposition: A | Discharge status: Home / Self Care | Payer: Medicaid Other | Source: Ambulatory Visit | Attending: Adult Health | Admitting: Adult Health

## 2017-11-04 DIAGNOSIS — R1310 Dysphagia, unspecified: Secondary | ICD-10-CM | POA: Diagnosis not present

## 2017-11-05 IMAGING — CT CT HEAD W/O CM
3 series · 15 of 47 positions shown, 18 images · non-contrast
Comparison: None.

CLINICAL DATA: Fall last week

EXAM:
CT HEAD WITHOUT CONTRAST
TECHNIQUE: Contiguous axial images were obtained from the base of the skull
through the vertex without intravenous contrast.

[Series 3: head 5.0 h30s · axial · 0.47mm/px · z∈[-154,-14]mm · 9 of 34 slices shown, 12 images]
[im 3/34  brain]
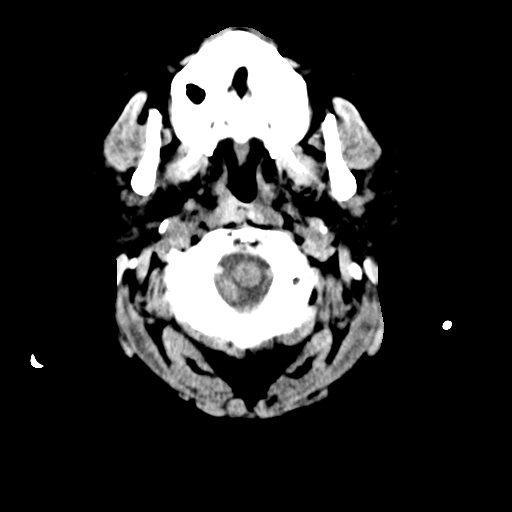
[im 3/34  bone]
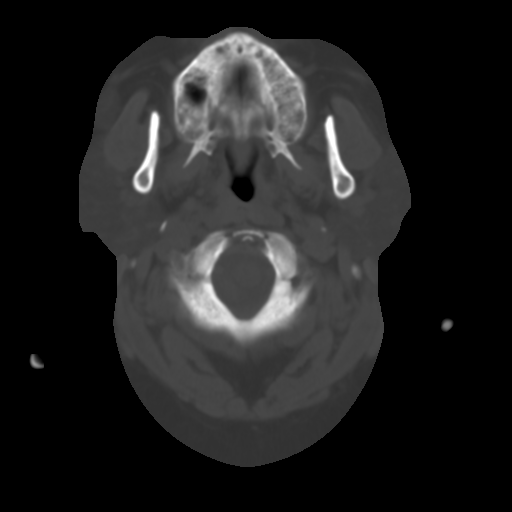
[im 6/34  brain]
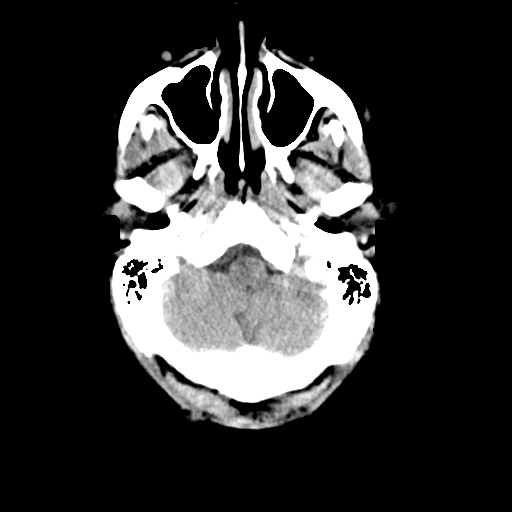
[im 10/34  brain]
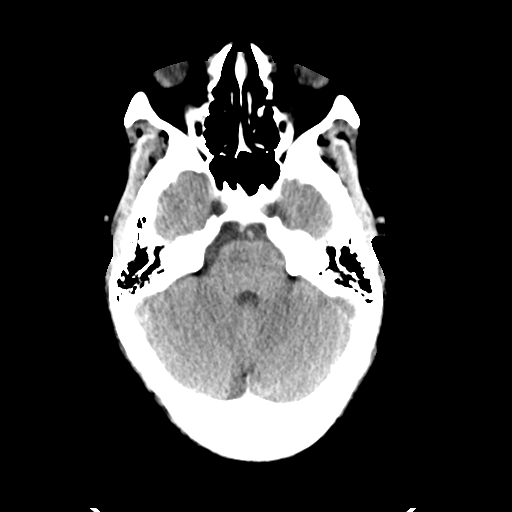
[im 13/34  brain]
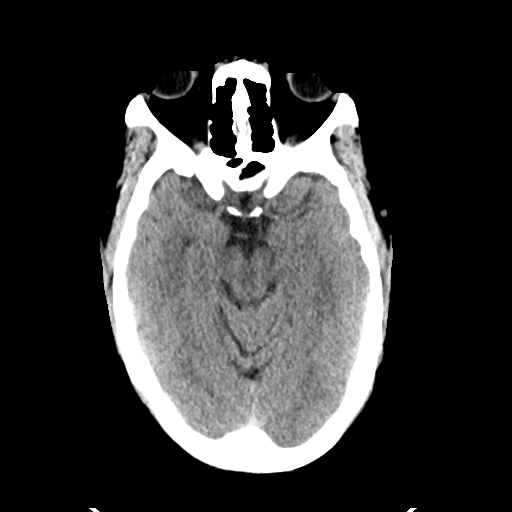
[im 18/34  brain]
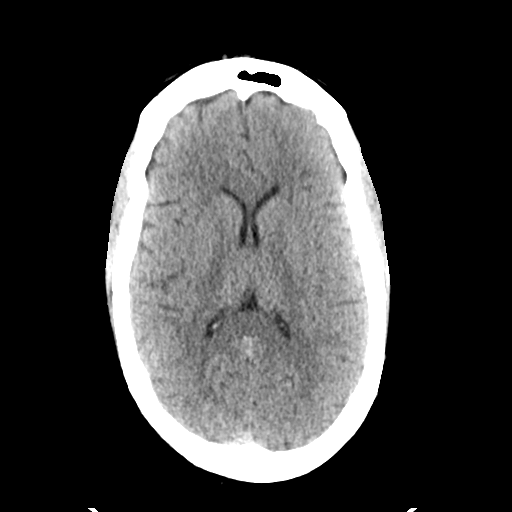
[im 18/34  bone]
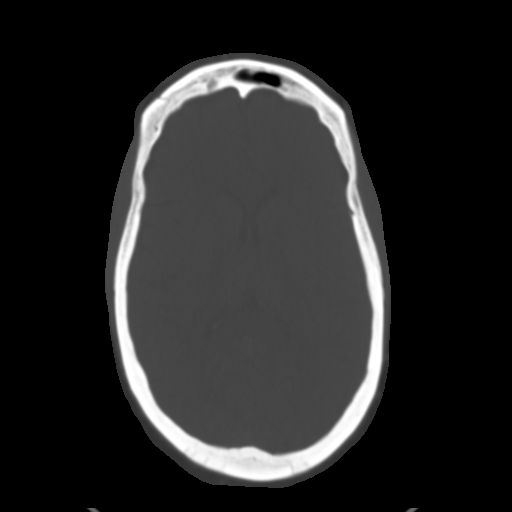
[im 21/34  brain]
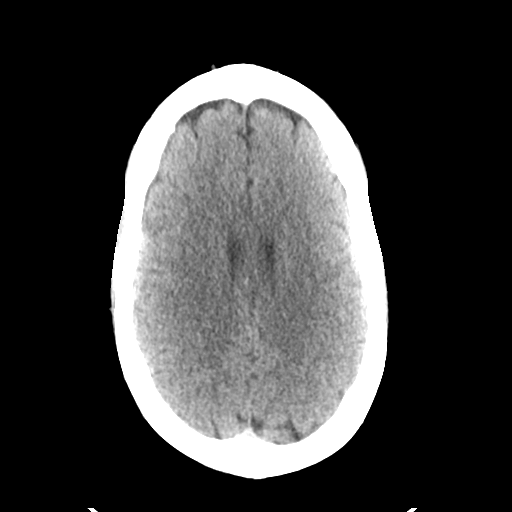
[im 24/34  brain]
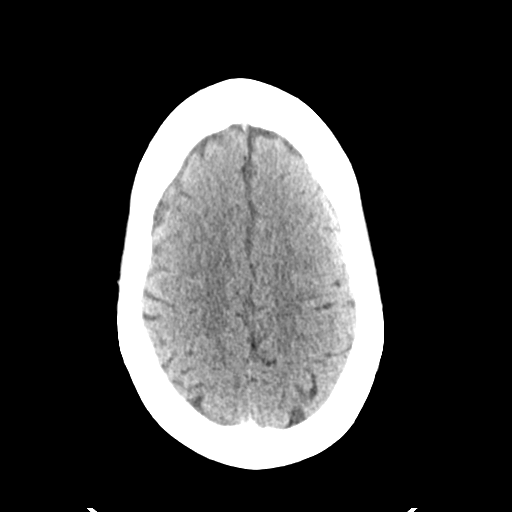
[im 28/34  brain]
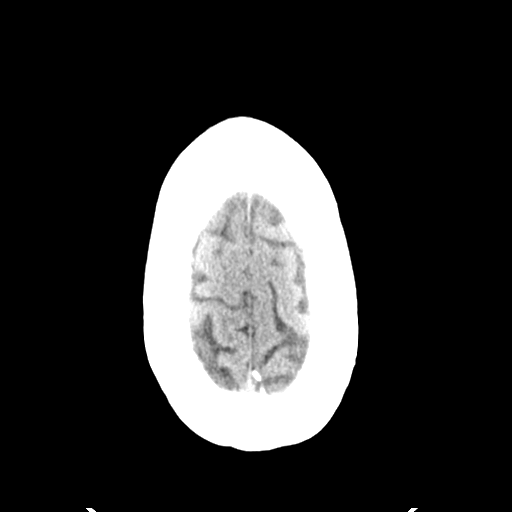
[im 31/34  brain]
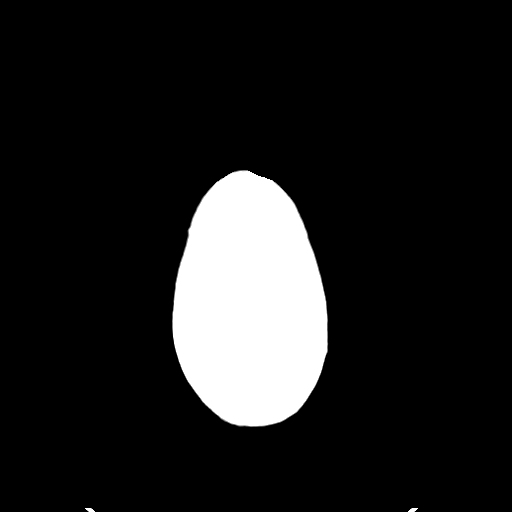
[im 31/34  bone]
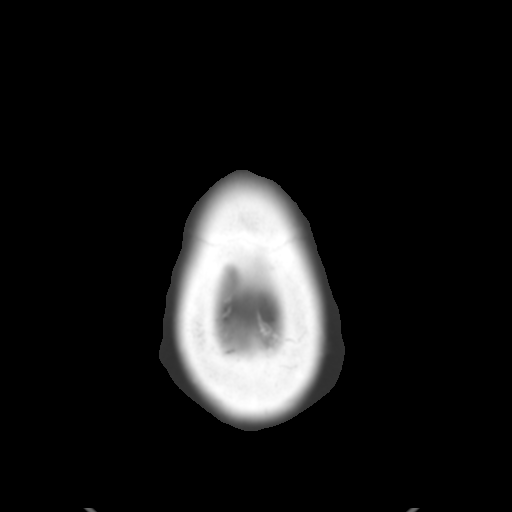

[Series 5: head 3.0 mpr cor · coronal · 0.32mm/px · 3 of 77 slices shown]
[im 26/77  brain]
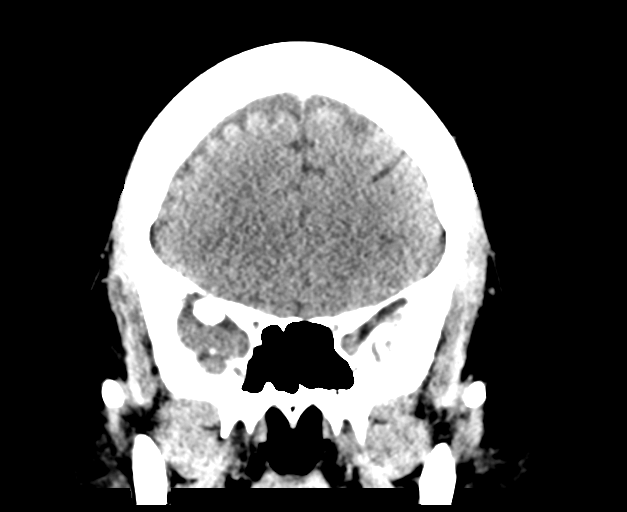
[im 34/77  brain]
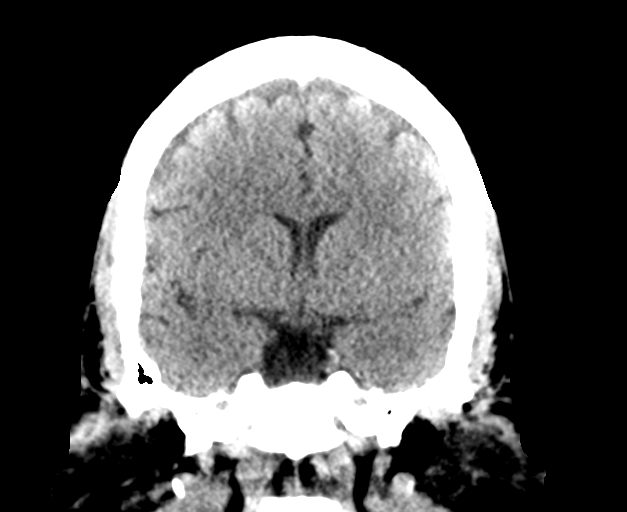
[im 43/77  brain]
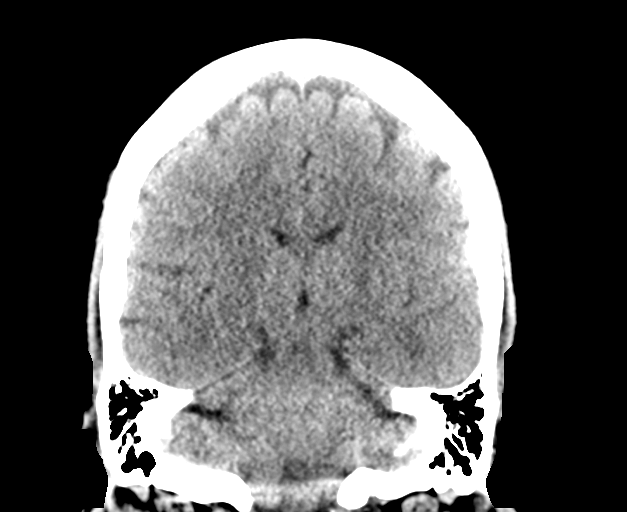

[Series 6: head 3.0 mpr sag · sagittal · 0.38mm/px · 3 of 67 slices shown]
[im 23/67  brain]
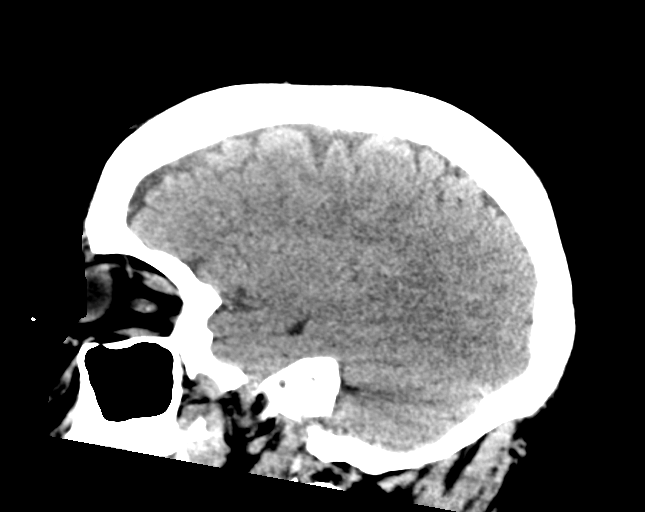
[im 34/67  brain]
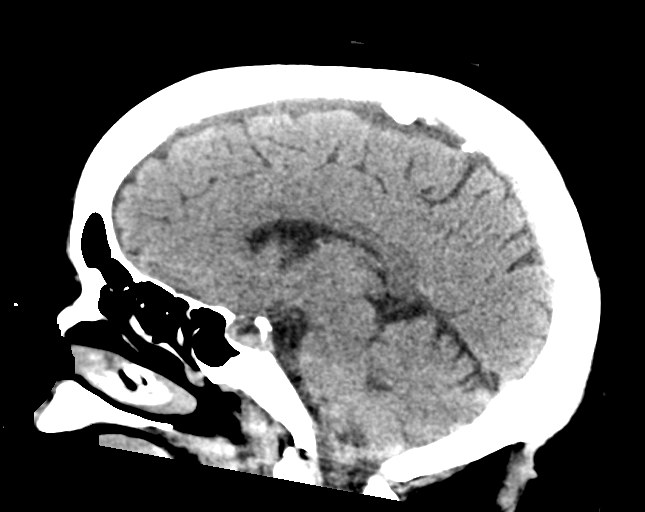
[im 45/67  brain]
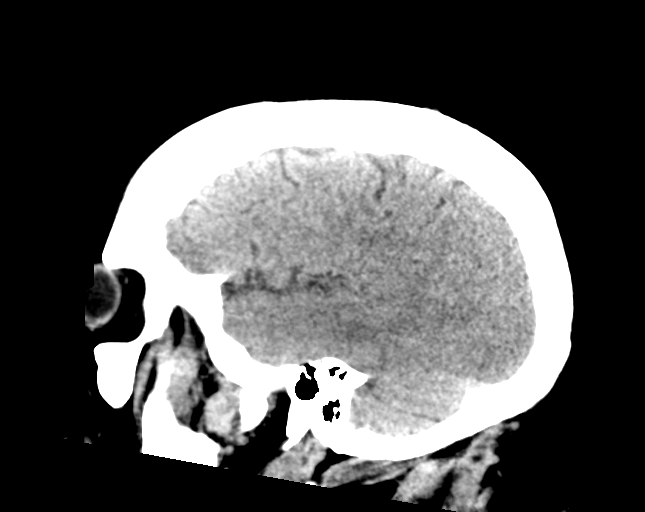

[15 of 47 positions shown; findings below may reference images not displayed]

FINDINGS: Brain: Ventricles are normal in size and configuration. There is no
mass, hemorrhage, edema or other evidence of acute parenchymal
abnormality. No extra-axial hemorrhage.

Vascular: No hyperdense vessel or unexpected calcification.

Skull: Normal. Negative for fracture or focal lesion.

Sinuses/Orbits: No acute finding.

Other: None.
IMPRESSION: Negative head CT.  No intracranial mass, hemorrhage or edema.

## 2017-11-06 ENCOUNTER — Encounter: Payer: Self-pay | Admitting: Adult Health

## 2017-11-06 ENCOUNTER — Non-Acute Institutional Stay (SKILLED_NURSING_FACILITY): Payer: Medicaid Other | Admitting: Adult Health

## 2017-11-06 DIAGNOSIS — F330 Major depressive disorder, recurrent, mild: Secondary | ICD-10-CM | POA: Diagnosis not present

## 2017-11-06 DIAGNOSIS — K219 Gastro-esophageal reflux disease without esophagitis: Secondary | ICD-10-CM | POA: Diagnosis not present

## 2017-11-06 DIAGNOSIS — I5032 Chronic diastolic (congestive) heart failure: Secondary | ICD-10-CM | POA: Diagnosis not present

## 2017-11-06 DIAGNOSIS — R112 Nausea with vomiting, unspecified: Secondary | ICD-10-CM | POA: Insufficient documentation

## 2017-11-06 NOTE — Progress Notes (Signed)
Location:   [facility] Room Number: 102 A Place of Service:  SNF (31)   CODE STATUS: Full Code  Allergies  Allergen Reactions  . Contrast Media [Iodinated Diagnostic Agents] Itching    Ct contrast  . Oxycodone-Acetaminophen Itching    Chief Complaint  Patient presents with  . Medical Management of Chronic Issues    Chf; gerd; n/v; depression.  Weekly follow up for the first 30 days following hospitalization     HPI:  She is a 47 year old short term rehab patient being seen for the management of her chronic illnesses: chf , gerd; depression; n/v. She states that she has n/v all the time; there are no definite patterns present. Denies any abdominal pain; no change in bowel or urine habits. She is complaining of depression and insomnia. She has not told the nursing concerns at this time.   Past Medical History:  Diagnosis Date  . Acanthosis nigricans   . Anxiety   . Arthritis    "knees" (04/28/2017)  . Asthma    Followed by Dr. Annamaria Boots (pulmonology); receives every other week omalizumab injections; has frequent exacerbations  . Chronic diastolic CHF (congestive heart failure) ([place]) 01/17/2017  . COPD (chronic obstructive pulmonary disease) ([place])    PFTs in 2002, FEV1/FVC 65, no post bronchodilater test done  . Depression   . GERD (gastroesophageal reflux disease)   . Headache(784.0)    "q couple days" (04/28/2017)  . Helicobacter pylori (H. pylori) infection   . Hypertension, essential   . Insomnia   . Menorrhagia   . Morbid obesity ([place])   . OSA on CPAP    Sleep study 2008 - mild OSA, not enough events to titrate CPAP; wears CPAP now/pt on 04/28/2017  . Pneumonia X 1  . Seasonal allergies   . Shortness of breath   . Tobacco user     Past Surgical History:  Procedure Laterality Date  . CARDIOVERSION N/A 05/30/2017   Procedure: CARDIOVERSION;  Surgeon: Sanda Klein, MD;  Location: [facility];  Service: Cardiovascular;  Laterality: N/A;  .  REDUCTION MAMMAPLASTY Bilateral 09/2011  . TUBAL LIGATION  1996   bilateral    Social History   Socioeconomic History  . Marital status: Single    Spouse name: Not on file  . Number of children: 1  . Years of education: Not on file  . Highest education level: Not on file  Occupational History  . Occupation: CNA  Social Needs  . Financial resource strain: Not on file  . Food insecurity:    Worry: Not on file    Inability: Not on file  . Transportation needs:    Medical: Not on file    Non-medical: Not on file  Tobacco Use  . Smoking status: Former Smoker    Packs/day: 0.50    Years: 26.00    Pack years: 13.00    Types: Cigarettes    Last attempt to quit: 09/12/2014    Years since quitting: 3.1  . Smokeless tobacco: Never Used  Substance and Sexual Activity  . Alcohol use: No  . Drug use: No  . Sexual activity: Never  Lifestyle  . Physical activity:    Days per week: Not on file    Minutes per session: Not on file  . Stress: Not on file  Relationships  . Social connections:    Talks on phone: Not on file    Gets together: Not on file    Attends religious service:  Not on file    Active member of club or organization: Not on file    Attends meetings of clubs or organizations: Not on file    Relationship status: Not on file  . Intimate partner violence:    Fear of current or ex partner: Not on file    Emotionally abused: Not on file    Physically abused: Not on file    Forced sexual activity: Not on file  Other Topics Concern  . 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   Family History  Problem Relation Age of Onset  . Hypertension Mother   . Asthma Daughter   . Cancer Paternal Aunt   . Asthma Maternal Grandmother       VITAL SIGNS BP 110/60   Pulse 90   Temp 98 F (36.7 C)   Resp 20   Ht 5\' 4"  (1.626 m)   Wt 299 lb (135.6 kg)   LMP 10/07/2017 (Approximate)   SpO2 97%   BMI 51.32 kg/m    Outpatient Encounter Medications as of 11/06/2017  Medication Sig  . apixaban (ELIQUIS) 5 MG TABS tablet Take 5 mg by mouth 2 (two) times daily.  . cetirizine (ZYRTEC) 10 MG tablet Take 10 mg by mouth daily.  . diclofenac sodium (VOLTAREN) 1 % GEL Apply 1 application topically 4 (four) times daily.  . fluticasone (FLONASE) 50 MCG/ACT nasal spray Place 1 spray into both nostrils daily.  . Fluticasone-Salmeterol (ADVAIR) 250-50 MCG/DOSE AEPB Inhale 1 puff into the lungs 2 (two) times daily.  . furosemide (LASIX) 40 MG tablet Take 40 mg by mouth daily.  [name] guaiFENesin (MUCINEX) 600 MG 12 hr tablet Take 2 tablets (1,200 mg total) by mouth 2 (two) times daily.  [name] ipratropium-albuterol (DUONEB) 0.5-2.5 (3) MG/3ML SOLN Take 3 mLs by nebulization every 2 (two) hours as needed.  [name] ipratropium-albuterol (DUONEB) 0.5-2.5 (3) MG/3ML SOLN Take 3 mLs by nebulization 2 (two) times daily.  . magnesium oxide (MAG-OX) 400 MG tablet Take 400 mg by mouth 3 (three) times daily.   . Melatonin 3 MG TABS Take 1 tablet by mouth at bedtime.  [name] menthol-cetylpyridinium (CEPACOL) 3 MG lozenge Take 1 lozenge by mouth every 4 (four) hours as needed for sore throat.  . montelukast (SINGULAIR) 10 MG tablet Take 10 mg by mouth at bedtime.  . Multiple Vitamin (MULTIVITAMIN WITH MINERALS) TABS tablet Take 1 tablet by mouth daily.  . ondansetron (ZOFRAN) 4 MG tablet Take 4 mg by mouth every 8 (eight) hours as needed for nausea or vomiting.  . pantoprazole (PROTONIX) 40 MG tablet Take 40 mg by mouth daily.  . Potassium Chloride ER 20 MEQ TBCR Take 20 mEq by mouth daily.  [name] SALINE NASAL SPRAY NA Place 1 spray into both nostrils every 2 (two) hours as needed.  . sertraline (ZOLOFT) 100 MG tablet Take 100 mg by mouth daily.  [name] zolpidem (AMBIEN) 5 MG tablet Take 1 tablet (5 mg total) by mouth at bedtime.   No facility-administered encounter medications on file as of 11/06/2017.      SIGNIFICANT DIAGNOSTIC EXAMS  PREVIOUS:    10-08-17: chest x-ray: Cardiomegaly with overall improving aeration. There could be residual LEFT lower lobe infiltrate.  10-08-17: ct of chest: 1. No acute cardiopulmonary disease. 2. Mild bilateral chronic interstitial lung disease.  10-08-17: ct angio of chest: 1. No pulmonary embolus to the segmental level. 2. Lung parenchymal better assessed on  noncontrast CT earlier this day given degree of motion on the current exam. Findings suspicious for interstitial lung disease again seen.  10-11-17: chest x-ray: 1. No definite acute findings. 2. Mild interstitial prominence, as on 10/08/2017.  10-13-17: ct of soft tissue of neck: Noncontrast CT of the neck demonstrates no mass or airway Compromise.  10-14-17: chest x-ray: No acute cardiopulmonary abnormality seen.   11-04-17: barium swallow: Normal esophageal peristalsis. No fixed esophageal narrowing or stricture. No evidence of hiatal hernia. No gastroesophageal reflux was demonstrated.   LABS REVIEWED: PREVIOUS:   10-08-17: wbc 15.8; hgb 12.6; hct 39.1; mcv 84.3; plt 401; glucose 94; bun <5; creat 0.93; k+ 2.7; na++ 138; ca 8.3; mag 1.3; d-dimer: 0.93 10-10-17: wbc 11.5; hgb 10.6; hct 33.6; mcv 83.8; plt 398; glucose 82; bun <5; creat 0.67; k+ 3.7; na++ 139; ca 8.0 10-12-17: wbc 15.4; hgb 10.5; hct 33.1; mcv 83.4; plt 428; glucose 83; bun <0.5; creat 0.6; k+ 3.1; na++ 139; ca 8.0 10-15-17: wbc 17.2; hgb 10.7; hct 33.5; mcv 83.1; plt 451; glucose 87; bun <5; creat 0.67; k+ 3.2; na++ 137; ca 8.2; mag 1.7; phos 5.2; liver normal albumin 2.3  10-20-17: wbc 16.1; hgb 11.4; hct 35; plt 474; glucose 69; bun 2; creat 0.6; k+ 3.4; na++ 137; liver normal  10-27-17: glucose 75; bun 4.1; k+ 3.2; na++ 139; ca 8.0; mag 1.5   TODAY:    11-03-17: wbc 14.5; hgb 10.7; hct 34; plt 424; glucose 92; bun 2; creat 0.6; k+ 4.2; na++ 139    Review of Systems  Constitutional: Negative for malaise/fatigue.  Respiratory: Negative for cough and shortness of breath.    Cardiovascular: Negative for chest pain, palpitations and leg swelling.  Gastrointestinal: Positive for nausea and vomiting. Negative for abdominal pain, constipation and heartburn.  Musculoskeletal: Negative for back pain, joint pain and myalgias.  Skin: Negative.   Neurological: Negative for dizziness.  Psychiatric/Behavioral: Positive for depression. The patient has insomnia.      Physical Exam  Constitutional: She is oriented to person, place, and time. She appears well-developed and well-nourished. No distress.  Morbid obesity   Neck: No thyromegaly present.  Cardiovascular: Normal rate, regular rhythm and intact distal pulses.  Murmur heard. 1/6  Pulmonary/Chest: Effort normal and breath sounds normal. No respiratory distress.  Abdominal: Soft. Bowel sounds are normal. She exhibits no distension and no mass. There is no tenderness. There is no rebound.  Musculoskeletal: Normal range of motion. She exhibits no edema.  Lymphadenopathy:    She has no cervical adenopathy.  Neurological: She is alert and oriented to person, place, and time.  Skin: Skin is warm and dry. She is not diaphoretic.  Psychiatric: She has a normal mood and affect.     ASSESSMENT/ PLAN:  TODAY:   1. Chronic diastolic heart failure: stable EF 60-65% (08-04-17): will continue lasix 40 mg daily with k+ 20 meq twice daily   2. Nausea with vomiting: is without change: barium swallow normal. Could be related to her recent trach placement and removal. Will change her diet to mech soft/bland diet. Will give her zofran 4 mg prior to meals. I have instructed her to inform nursing when she vomits; stated understanding.   3. GERD without esophagitis: stable will continue protonix 40 mg daily   4. Mild episode of recurrent major depressive disorder: is worse; will increase her zoloft to 200 mg mdaily, melatonin to 6 mg nightly and will continue ambien 5 mg nightly for sleep   PREVIOUS  5. Cervical back pain with  evidence of disc disease: is stable is using voltaren gel four times daily   6. Hypomagnesemia: without change mag 1.5: will continue mag ox three times daily   7. Moderate persistent asthma with exacerbation: is stable will continue singulair daily; flonase daily; zyrtec 10 mg daily    8.  Chronic atrial fibrillation: heart rate stable: will continue eliquis 5 mg twice daily   8. COPD: stable will continue advair 250/50  twice daily duoneb twice daily and every 2 hours as needed and mucinex 1200 mg twice daily      MD is aware of resident's narcotic use and is in agreement with current plan of care. We will attempt to wean resident as apropriate      Ok Edwards NP [facility]  Contact [phone] Monday through Friday 8am- 5pm  After hours call [phone]

## 2017-11-07 ENCOUNTER — Encounter: Payer: Self-pay | Admitting: Internal Medicine

## 2017-11-07 ENCOUNTER — Ambulatory Visit (INDEPENDENT_AMBULATORY_CARE_PROVIDER_SITE_OTHER): Payer: Medicaid Other | Admitting: Internal Medicine

## 2017-11-07 ENCOUNTER — Inpatient Hospital Stay: Payer: Medicaid Other | Admitting: Internal Medicine

## 2017-11-07 DIAGNOSIS — J449 Chronic obstructive pulmonary disease, unspecified: Secondary | ICD-10-CM

## 2017-11-07 NOTE — Assessment & Plan Note (Signed)
Pulmonary status is largely recovered from acute respiratory failure requiring tracheostomy and ventilator support in April 2019 related to pneumonia. Plan-continue present meds.  Remain off of cigarettes.  Continue with rehabilitation, building stamina.

## 2017-11-07 NOTE — Patient Instructions (Signed)
Ok to continue present meds   Please call if needed

## 2017-11-07 NOTE — Progress Notes (Signed)
Patient ID: April [name], female    DOB: Sep 09, 1970, 47 y.o.   MRN: [id number]   HPI F former smoker followed for chronic severe asthma/ Xolair complicated by non-compliance,  OSA/ CPAP,  morbid obesity, HBP, depression, GERD, AFib/ Eliquis  NPSG 03/31/07- AHI 5.8/ hr, weight 330 lbs. She canceled planned more recent study. Office spirometry 10/24/14- severe obstructive airways disease. FVC 2.10/66%, FEV1 1.25/47%, FEV1/FVC 0.59, FEF 25-75 percent 0.64/19% Unattended HST 07/06/16- AHI 11/hour, desaturation to 74%. Office Spirometry 07/29/2016-moderately severe obstructive airways disease. FVC 2.21/68%, FEV1 1.53/57%, ratio 0.69, FEF 25-75% 0.98/34% Prolonged hosp [facility]/ [facility] 4/19- tracheostomy. ------------------------------------------------------- 07/14/17- 47 year old female former smoker followed for chronic severe asthma/Xolair, complicated by noncompliance, OSA/CPAP, morbid obesity, HBP, depression, GERD CPAP AutoPap 5-20/Advanced Prednisone 10 mg daily,  Singulair, Dulera 200, nebulizer Pulmicort/ Brovana,, albuterol HFA Now starting prednisone taper after most recent hospital for exacerbation.  This episode began as gradual worsening of chest tightness and wheeze over 2 days with no impression that she had an infection, fever or sore throat.  Eyes are watering and nose running-blames pollen.  Turned down for weight loss surgery because of frequent hospitalizations.  Applying for disability-unable to hold her job because of frequent illness.  Running out of inhalers and asks for samples.  Does have nebulizer machine which she used just before coming here. Patient has not filled out application forms for [employer]ue despite multiple calls from our office. We had hoped the longer injection interval compared with Xolair would be easier for her to comply with.  She says she is using her CPAP machine "most nights"-no download available.  She wants to change to nasal pillows mask and agrees to take out her  nasal ring for this.  11/07/2017- 47 year old female former smoker followed for chronic severe asthma/Xolair, complicated by noncompliance, OSA/CPAP, morbid obesity, HBP, depression, GERD CPAP AutoPap 5-20/Advanced -----Asthma: Pt has had several ED/hospital visits since last OV 07/14/17. Currently at [facility]  Advair 250, Flonase, Zyrtec, Singulair, neb DuoNeb, Complicated medical history since last seen.  As I reconstructed, she was hospitalized in April at [facility]-tracheostomy/intubation, then discharged to rehab, then home in early June.  Returned back to [facility] and now staying at [facility].  Expects to go home in a week or so.  Is not using oxygen.  Breathing much better- feels comfortable.  Uses rescue inhaler and nebulizer machine each once or twice a week while continuing Advair.  Little sputum, no fever or chest pain.  Remains very weak with limited walking. She describes much nausea and reflux or regurgitation with some weight loss. CXR 10/14/2017 IMPRESSION: No acute cardiopulmonary abnormality seen  Review of Systems-See HPI  + = positive Constitutional:   + weight loss, night sweats, fevers, chills, +fatigue, lassitude. HEENT:   No-  headaches, difficulty swallowing, tooth/dental problems, sore throat,    sneezing, itching, ear ache, +nasal congestion, post nasal drip,  CV:  No-   chest pain, orthopnea, PND, swelling in lower extremities, anasarca, dizziness, palpitations Resp: +shortness of breath with exertion or at rest.              productive cough,  + non-productive cough,  No- coughing up of blood.              No-   change in color of mucus. +wheezing.   Skin: No-   rash or lesions. GI:  No-   heartburn, indigestion, abdominal pain, nausea, vomiting,  GU: MS:  No-  joint pain or swelling.   Neuro-     nothing unusual Psych:  No- change in mood or affect. No depression or anxiety.  No memory loss.    Objective:   Physical Exam   +  wheelchair General- Alert, Oriented, Affect-appropriate, Distress- none acute, + morbid  obesity,  Skin- rash-none, lesions- none, excoriation- none Lymphadenopathy- none Head- atraumatic            Eyes- Gross vision intact, PERRLA, conjunctivae clear secretions            Ears- Hearing, canals-normal            Nose- Clear, no-Septal dev, mucus, polyps, erosion, perforation. + sniffing            Throat- Mallampati III , mucosa clear , drainage- none, tonsils- atrophic,                           + Missing  teeth, +upper denture plate is out                  Neck- flexible , trachea+ stoma scar, no stridor , thyroid nl, carotid no bruit Chest - symmetrical excursion , unlabored           Heart/CV- RRR , no murmur , no gallop  , no rub, nl s1 s2                           - JVD- none , edema- none, stasis changes- none, varices- none           Lung- +Diminished/ clear now, unlabored.  Wheeze -None, cough-none,                                               dullness-none,  rub- none           Chest wall-  Abd-  Br/ Gen/ Rectal- Not done, not indicated Extrem- cyanosis- none, clubbing, none, atrophy- none, strength- nl Neuro- grossly intact to observation

## 2017-11-10 ENCOUNTER — Inpatient Hospital Stay: Payer: Medicaid Other | Admitting: Internal Medicine

## 2017-11-12 ENCOUNTER — Non-Acute Institutional Stay (SKILLED_NURSING_FACILITY): Payer: Medicaid Other | Admitting: Adult Health

## 2017-11-12 ENCOUNTER — Ambulatory Visit: Payer: Medicaid Other | Admitting: Family Medicine

## 2017-11-12 ENCOUNTER — Encounter: Payer: Self-pay | Admitting: Adult Health

## 2017-11-12 DIAGNOSIS — J4541 Moderate persistent asthma with (acute) exacerbation: Secondary | ICD-10-CM | POA: Diagnosis not present

## 2017-11-12 DIAGNOSIS — R112 Nausea with vomiting, unspecified: Secondary | ICD-10-CM

## 2017-11-12 DIAGNOSIS — I482 Chronic atrial fibrillation, unspecified: Secondary | ICD-10-CM

## 2017-11-12 DIAGNOSIS — J449 Chronic obstructive pulmonary disease, unspecified: Secondary | ICD-10-CM | POA: Diagnosis not present

## 2017-11-12 DIAGNOSIS — F330 Major depressive disorder, recurrent, mild: Secondary | ICD-10-CM

## 2017-11-12 IMAGING — CT CT ABD-PELV W/ CM
2 of 5 series · 15 of 46 positions shown, 17 images · IV contrast (Omni 300)
Comparison: December 22, 2012

CLINICAL DATA: External hemorrhoids with pain and bleeding.

EXAM:
CT ABDOMEN AND PELVIS WITH CONTRAST
TECHNIQUE: Multidetector CT imaging of the abdomen and pelvis was performed
using the standard protocol following bolus administration of
intravenous contrast.
CONTRAST:  Due to difficulties with the patient's IV, she was
scanned after 60 cc of Isovue 370.

[Series 4: a/p w/ 5mm · axial · 0.85mm/px · z∈[+798,+1263]mm · 12 of 105 slices shown, 14 images]
[im 6/105  soft-tissue]
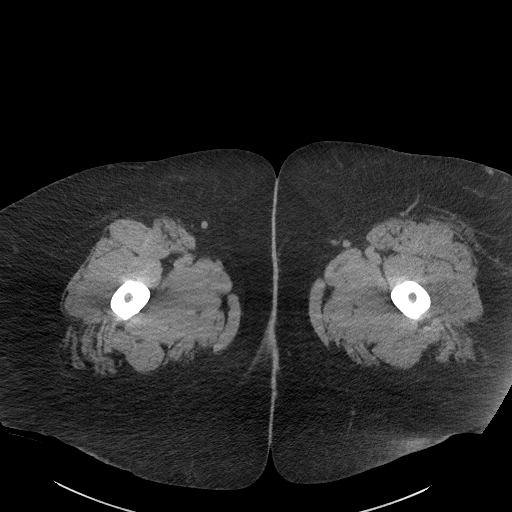
[im 6/105  bone]
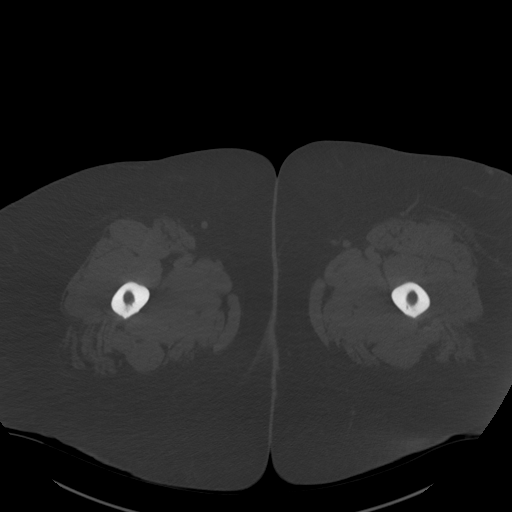
[im 16/105  soft-tissue]
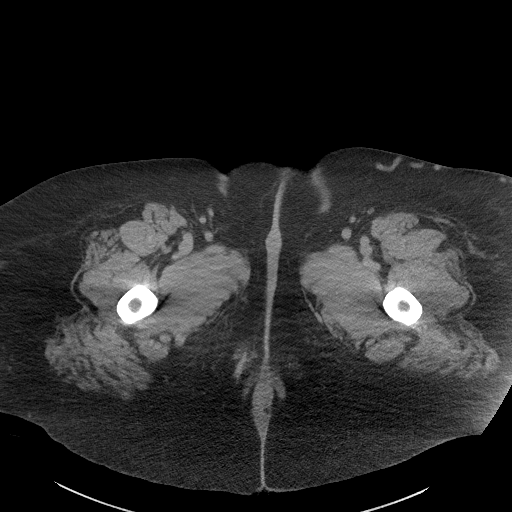
[im 21/105  soft-tissue]
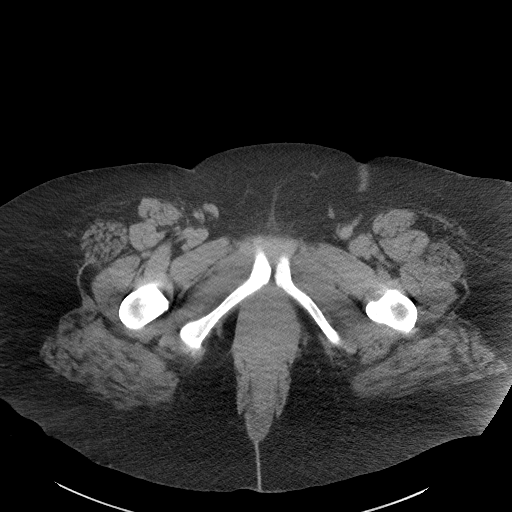
[im 32/105  soft-tissue]
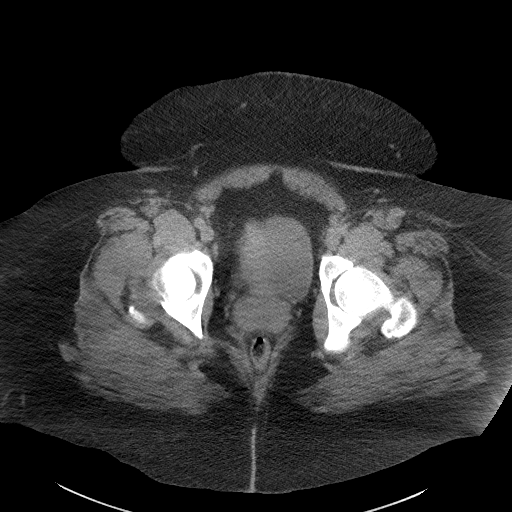
[im 42/105  soft-tissue]
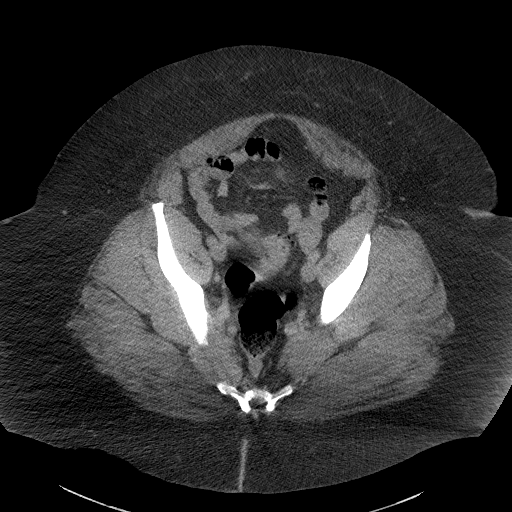
[im 47/105  soft-tissue]
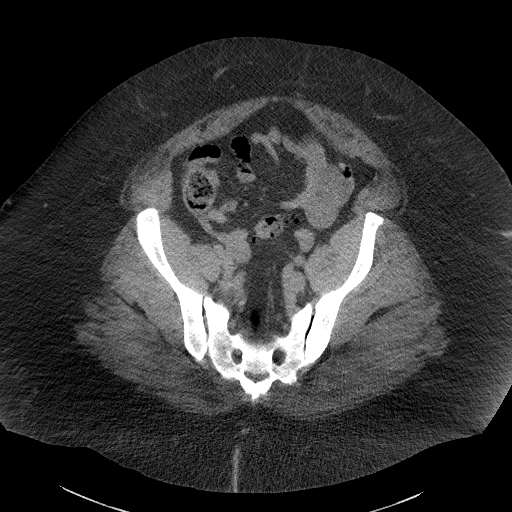
[im 58/105  soft-tissue]
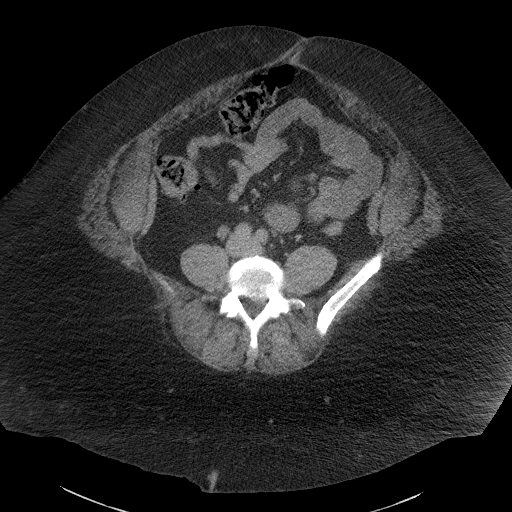
[im 63/105  soft-tissue]
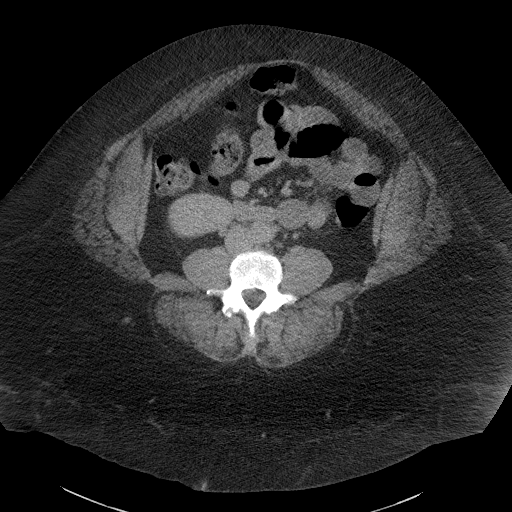
[im 73/105  soft-tissue]
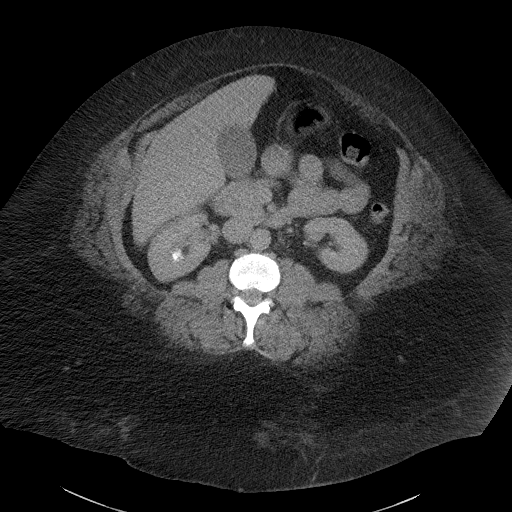
[im 73/105  bone]
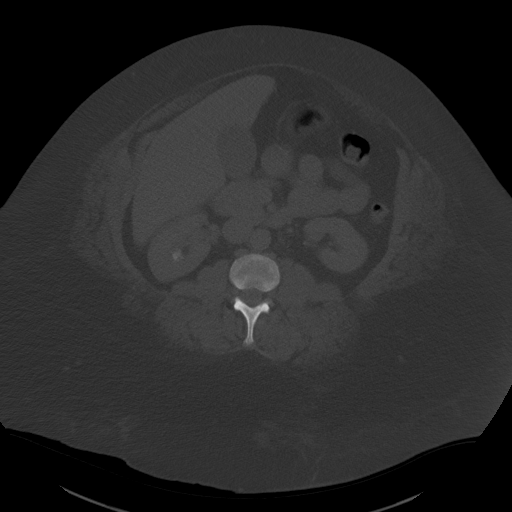
[im 84/105  soft-tissue]
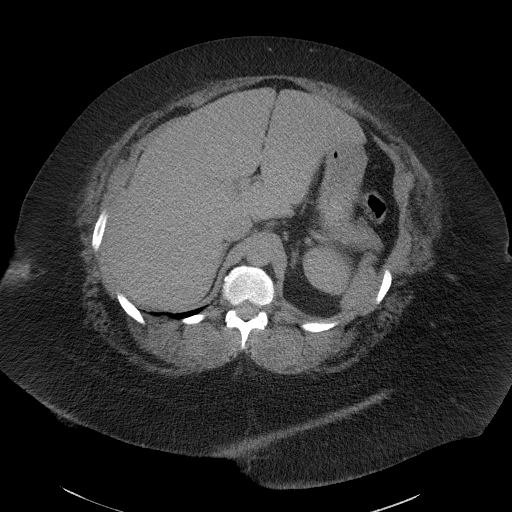
[im 89/105  soft-tissue]
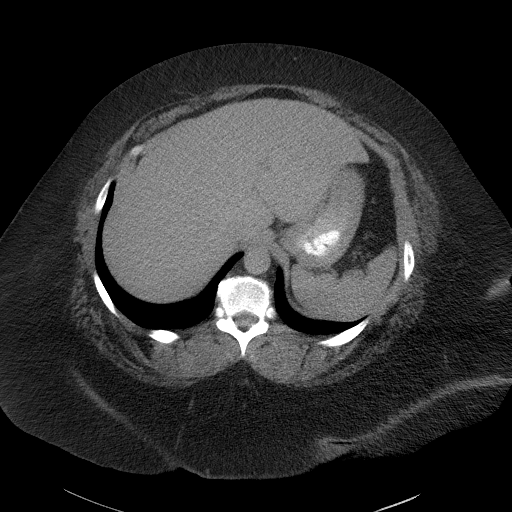
[im 99/105  soft-tissue]
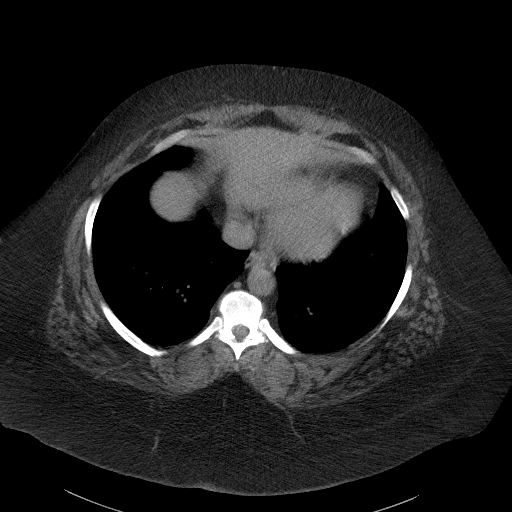

[Series 7: a/p w/ cor · coronal · 0.73mm/px · 3 of 151 slices shown]
[im 51/151  soft-tissue]
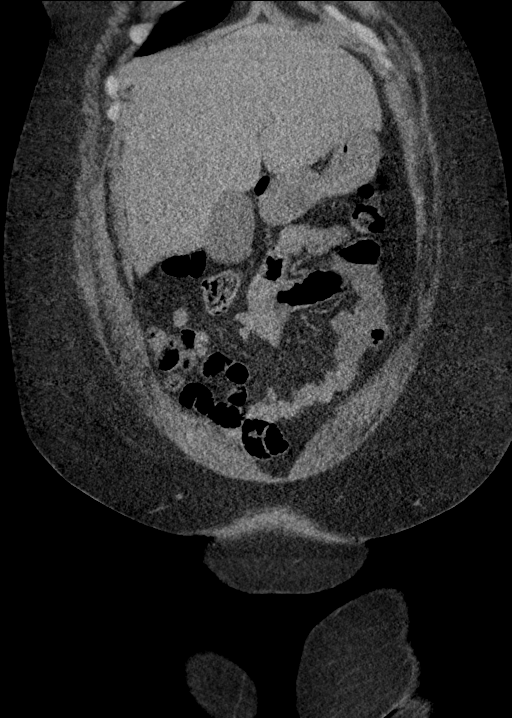
[im 67/151  soft-tissue]
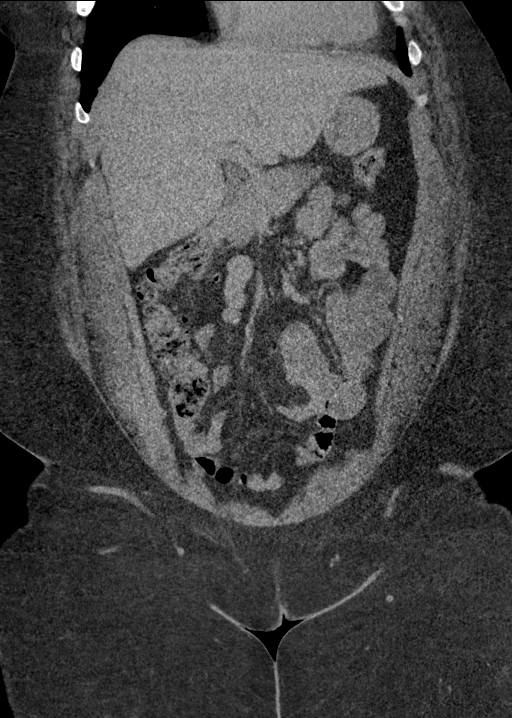
[im 84/151  soft-tissue]
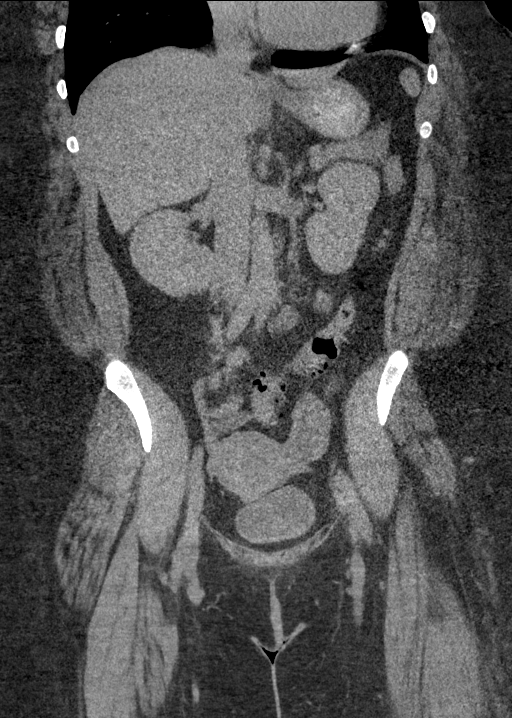

[15 of 46 positions shown; findings below may reference images not displayed]

FINDINGS: Lower chest: No acute abnormality.

Evaluation of the abdomen and pelvis is limited due to IV
malfunction and patient body habitus. The study has nearly the
appearance of a noncontrast enhanced study

Hepatobiliary: No focal liver abnormality is seen. No gallstones,
gallbladder wall thickening, or biliary dilatation.

Pancreas: Unremarkable. No pancreatic ductal dilatation or
surrounding inflammatory changes.

Spleen: Normal in size without focal abnormality.

Adrenals/Urinary Tract: Adrenal glands are unremarkable. Kidneys are
normal, without renal calculi, focal lesion, or hydronephrosis.
Bladder is unremarkable.

Stomach/Bowel: The stomach and small bowel are normal. Colonic
diverticuli are identified, particularly in the sigmoid colon. There
appears to be mild stranding adjacent to the sigmoid colon on series
4, image 63 suggesting possible mild diverticulitis. The remainder
of the colon is normal. The appendix is best seen on coronal images
and is normal with no appendicitis.

Vascular/Lymphatic: No significant vascular findings are present. No
enlarged abdominal or pelvic lymph nodes.

Reproductive: Uterus and bilateral adnexa are unremarkable.

Other: No free air or free fluid. A fat containing ventral hernias
identified. The patient's known external hemorrhoid will be better
assessed with physical exam. A rounded structure adjacent the anus
on CT imaging likely represents 1 of the hemorrhoids

Musculoskeletal: No acute or significant osseous findings.
IMPRESSION: 1. The study is limited due to malfunction of the IV resulting in
nearly a noncontrast study with only minimal contrast seen in the
kidneys.
2. Diverticuli identified, primarily in the sigmoid colon. Mild
adjacent stranding suggests low grade diverticulitis. Recommend
clinical correlation.
3. The patient's known external hemorrhoids would be better assessed
with physical exam. A rounded structure adjacent the anus is
consistent with 1 of the hemorrhoids.
4. No other abnormalities.

## 2017-11-12 NOTE — Progress Notes (Signed)
Location:   [facility] Room Number: 102 A Place of Service:  SNF (31)   CODE STATUS: Full code  Allergies  Allergen Reactions  . Contrast Media [Iodinated Diagnostic Agents] Itching    Ct contrast  . Oxycodone-Acetaminophen Itching    Chief Complaint  Patient presents with  . Medical Management of Chronic Issues    Afib; copd; asthma; n/v; depression.     HPI:  She is a 47 year old short term rehab patient being seen for the management of her chronic illnesses: afib; copd; asthma; n/v; depression. She denies any nausea or vomiting; no cough or shortness of breath. There are no nursing concerns at this time.   Past Medical History:  Diagnosis Date  . Acanthosis nigricans   . Anxiety   . Arthritis    "knees" (04/28/2017)  . Asthma    Followed by Dr. Annamaria Boots (pulmonology); receives every other week omalizumab injections; has frequent exacerbations  . Chronic diastolic CHF (congestive heart failure) ([place]) 01/17/2017  . COPD (chronic obstructive pulmonary disease) ([place])    PFTs in 2002, FEV1/FVC 65, no post bronchodilater test done  . Depression   . GERD (gastroesophageal reflux disease)   . Headache(784.0)    "q couple days" (04/28/2017)  . Helicobacter pylori (H. pylori) infection   . Hypertension, essential   . Insomnia   . Menorrhagia   . Morbid obesity ([place])   . OSA on CPAP    Sleep study 2008 - mild OSA, not enough events to titrate CPAP; wears CPAP now/pt on 04/28/2017  . Pneumonia X 1  . Seasonal allergies   . Shortness of breath   . Tobacco user     Past Surgical History:  Procedure Laterality Date  . CARDIOVERSION N/A 05/30/2017   Procedure: CARDIOVERSION;  Surgeon: Sanda Klein, MD;  Location: [facility];  Service: Cardiovascular;  Laterality: N/A;  . REDUCTION MAMMAPLASTY Bilateral 09/2011  . TUBAL LIGATION  1996   bilateral    Social History   Socioeconomic History  . Marital status: Single    Spouse name: Not on file  .  Number of children: 1  . Years of education: Not on file  . Highest education level: Not on file  Occupational History  . Occupation: CNA  Social Needs  . Financial resource strain: Not on file  . Food insecurity:    Worry: Not on file    Inability: Not on file  . Transportation needs:    Medical: Not on file    Non-medical: Not on file  Tobacco Use  . Smoking status: Former Smoker    Packs/day: 0.50    Years: 26.00    Pack years: 13.00    Types: Cigarettes    Last attempt to quit: 09/12/2014    Years since quitting: 3.1  . Smokeless tobacco: Never Used  Substance and Sexual Activity  . Alcohol use: No  . Drug use: No  . Sexual activity: Never  Lifestyle  . Physical activity:    Days per week: Not on file    Minutes per session: Not on file  . Stress: Not on file  Relationships  . Social connections:    Talks on phone: Not on file    Gets together: Not on file    Attends religious service: Not on file    Active member of club or organization: Not on file    Attends meetings of clubs or organizations: Not on file    Relationship status: Not  on file  . Intimate partner violence:    Fear of current or ex partner: Not on file    Emotionally abused: Not on file    Physically abused: Not on file    Forced sexual activity: Not on file  Other Topics Concern  . 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   Family History  Problem Relation Age of Onset  . Hypertension Mother   . Asthma Daughter   . Cancer Paternal Aunt   . Asthma Maternal Grandmother       VITAL SIGNS BP 115/76   Pulse 89   Temp 97.9 F (36.6 C)   Resp 18   Ht 5\' 4"  (1.626 m)   Wt 298 lb 8 oz (135.4 kg)   SpO2 96%   BMI 51.24 kg/m   Outpatient Encounter Medications as of 11/12/2017  Medication Sig  . acetaminophen (TYLENOL) 325 MG tablet Take 650 mg by mouth every 6 (six) hours as needed for mild pain.  [name] apixaban (ELIQUIS) 5 MG TABS tablet  Take 5 mg by mouth 2 (two) times daily.  . cetirizine (ZYRTEC) 10 MG tablet Take 10 mg by mouth daily.  . fluticasone (FLONASE) 50 MCG/ACT nasal spray Place 1 spray into both nostrils daily.  . Fluticasone-Salmeterol (ADVAIR) 250-50 MCG/DOSE AEPB Inhale 1 puff into the lungs 2 (two) times daily.  . furosemide (LASIX) 40 MG tablet Take 40 mg by mouth daily.  [name] guaiFENesin (MUCINEX) 600 MG 12 hr tablet Take 2 tablets (1,200 mg total) by mouth 2 (two) times daily.  [name] ipratropium-albuterol (DUONEB) 0.5-2.5 (3) MG/3ML SOLN Inhale 3 ml two times daily and every 2 hours as needed for shortness of breath or wheezing  . magnesium oxide (MAG-OX) 400 MG tablet Take 400 mg by mouth 3 (three) times daily.   . Melatonin 3 MG TABS Take 1 tablet by mouth at bedtime.  [name] menthol-cetylpyridinium (CEPACOL) 3 MG lozenge Take 1 lozenge by mouth every 4 (four) hours as needed for sore throat.  . montelukast (SINGULAIR) 10 MG tablet Take 10 mg by mouth at bedtime.  . Multiple Vitamin (MULTIVITAMIN WITH MINERALS) TABS tablet Take 1 tablet by mouth daily.  . ondansetron (ZOFRAN) 4 MG tablet Take 4 mg by mouth 3 (three) times daily before meals.   . pantoprazole (PROTONIX) 40 MG tablet Take 40 mg by mouth daily.  . Potassium Chloride ER 20 MEQ TBCR Take 20 mEq by mouth 2 (two) times daily.   [name] SALINE NASAL SPRAY NA Place 1 spray into both nostrils every 2 (two) hours as needed.  . sertraline (ZOLOFT) 100 MG tablet Take 100 mg by mouth daily.  [name] zolpidem (AMBIEN) 5 MG tablet Take 1 tablet (5 mg total) by mouth at bedtime.   No facility-administered encounter medications on file as of 11/12/2017.      SIGNIFICANT DIAGNOSTIC EXAMS  PREVIOUS:   10-08-17: chest x-ray: Cardiomegaly with overall improving aeration. There could be residual LEFT lower lobe infiltrate.  10-08-17: ct of chest: 1. No acute cardiopulmonary disease. 2. Mild bilateral chronic interstitial lung disease.  10-08-17: ct angio of chest: 1. No pulmonary  embolus to the segmental level. 2. Lung parenchymal better assessed on noncontrast CT earlier this day given degree of motion on the current exam. Findings suspicious for interstitial lung disease again seen.  10-11-17: chest x-ray: 1. No definite acute findings. 2. Mild interstitial prominence, as on 10/08/2017.  10-13-17: ct  of soft tissue of neck: Noncontrast CT of the neck demonstrates no mass or airway Compromise.  10-14-17: chest x-ray: No acute cardiopulmonary abnormality seen.   11-04-17: barium swallow: Normal esophageal peristalsis. No fixed esophageal narrowing or stricture. No evidence of hiatal hernia. No gastroesophageal reflux was demonstrated.  NO NEW EXAMS.    LABS REVIEWED: PREVIOUS:   10-08-17: wbc 15.8; hgb 12.6; hct 39.1; mcv 84.3; plt 401; glucose 94; bun <5; creat 0.93; k+ 2.7; na++ 138; ca 8.3; mag 1.3; d-dimer: 0.93 10-10-17: wbc 11.5; hgb 10.6; hct 33.6; mcv 83.8; plt 398; glucose 82; bun <5; creat 0.67; k+ 3.7; na++ 139; ca 8.0 10-12-17: wbc 15.4; hgb 10.5; hct 33.1; mcv 83.4; plt 428; glucose 83; bun <0.5; creat 0.6; k+ 3.1; na++ 139; ca 8.0 10-15-17: wbc 17.2; hgb 10.7; hct 33.5; mcv 83.1; plt 451; glucose 87; bun <5; creat 0.67; k+ 3.2; na++ 137; ca 8.2; mag 1.7; phos 5.2; liver normal albumin 2.3  10-20-17: wbc 16.1; hgb 11.4; hct 35; plt 474; glucose 69; bun 2; creat 0.6; k+ 3.4; na++ 137; liver normal  10-27-17: glucose 75; bun 4.1; k+ 3.2; na++ 139; ca 8.0; mag 1.5  11-03-17: wbc 14.5; hgb 10.7; hct 34; plt 424; glucose 92; bun 2; creat 0.6; k+ 4.2; na++ 139   NO NEW LABS.    Review of Systems  Constitutional: Negative for malaise/fatigue.  Respiratory: Negative for cough and shortness of breath.   Cardiovascular: Negative for chest pain, palpitations and leg swelling.  Gastrointestinal: Positive for nausea and vomiting. Negative for abdominal pain, constipation and heartburn.       Improving   Musculoskeletal: Negative for back pain, joint pain and myalgias.    Skin: Negative.   Neurological: Negative for dizziness.  Psychiatric/Behavioral: Positive for depression. The patient has insomnia. The patient is not nervous/anxious.     Physical Exam  Constitutional: She is oriented to person, place, and time. She appears well-developed and well-nourished. No distress.  Morbid obesity   Neck: No thyromegaly present.  Cardiovascular: Normal rate, regular rhythm and intact distal pulses.  Murmur heard. 1/6  Pulmonary/Chest: Effort normal and breath sounds normal. No respiratory distress.  Abdominal: Soft. Bowel sounds are normal. She exhibits no distension. There is no tenderness.  Musculoskeletal: Normal range of motion. She exhibits no edema.  Lymphadenopathy:    She has no cervical adenopathy.  Neurological: She is alert and oriented to person, place, and time.  Skin: Skin is warm and dry. She is not diaphoretic.  Psychiatric: She has a normal mood and affect.      ASSESSMENT/ PLAN:  TODAY:   1. Nausea with vomiting: is without change: barium swallow normal. Could be related to her recent trach placement and removal. Is slightly improved will continue zofran 4 mg prior to meals.    2. Mild episode of recurrent major depressive disorder: is worse; will increase her zoloft to 200 mg mdaily, melatonin to 6 mg nightly and will continue ambien 5 mg nightly for sleep  3. Moderate persistent asthma with exacerbation: is stable will continue singulair daily; flonase daily; zyrtec 10 mg daily    4.  Chronic atrial fibrillation: heart rate stable: will continue eliquis 5 mg twice daily   5. COPD: stable will continue advair 250/50  twice daily duoneb twice daily and every 2 hours as needed and mucinex 1200 mg twice daily   PREVIOUS  6. Hypomagnesemia: without change mag 1.5: will continue mag ox three times daily   7. Chronic  diastolic heart failure: stable EF 60-65% (08-04-17): will continue lasix 40 mg daily with k+ 20 meq twice daily   8.  GERD without esophagitis: stable will continue protonix 40 mg daily    MD is aware of resident's narcotic use and is in agreement with current plan of care. We will attempt to wean resident as apropriate   Ok Edwards NP [facility]  Contact [phone] Monday through Friday 8am- 5pm  After hours call [phone]

## 2017-11-14 ENCOUNTER — Telehealth: Payer: Self-pay | Admitting: Family Medicine

## 2017-11-14 DIAGNOSIS — N39498 Other specified urinary incontinence: Secondary | ICD-10-CM

## 2017-11-14 NOTE — Telephone Encounter (Signed)
Patient called requesting to see if her PCP can write an Rx for Depends. Please f/u

## 2017-11-17 DIAGNOSIS — R32 Unspecified urinary incontinence: Secondary | ICD-10-CM | POA: Insufficient documentation

## 2017-11-17 NOTE — Telephone Encounter (Signed)
Call received from the patient  April [name] she has been experiencing episodes of incontinence due to the medication that she is taking. She explained that she is trying to plan for when she is discharged and would like the depends . Informed her that Dr Margarita Rana would be notified of the request.

## 2017-11-17 NOTE — Telephone Encounter (Signed)
Done

## 2017-11-17 NOTE — Telephone Encounter (Signed)
Call placed to the patient # [phone] and a voicemail message was left requesting a call back to # [phone].  Call placed to [facility], [employer]. She stated that the patient is still a resident there. She is still not able to navigate steps and she has been approved for another 2 weeks of therapy.

## 2017-11-17 NOTE — Telephone Encounter (Signed)
We do not have a diagnosis on file to support the need.  Could you please find out what the diagnosis would be?  Thank you.

## 2017-11-18 NOTE — Telephone Encounter (Addendum)
Thank you. I will place order when discharge from SNF is determined.   Call placed to patient and informed her that the depends can be ordered when her discharge date is determined.

## 2017-11-29 IMAGING — CR DG CHEST 2V
2 series · 2 of 2 positions shown · non-contrast
Comparison: 10/18/2016

CLINICAL DATA: Asthma exacerbation.

EXAM:
CHEST  2 VIEW

[chest pa]
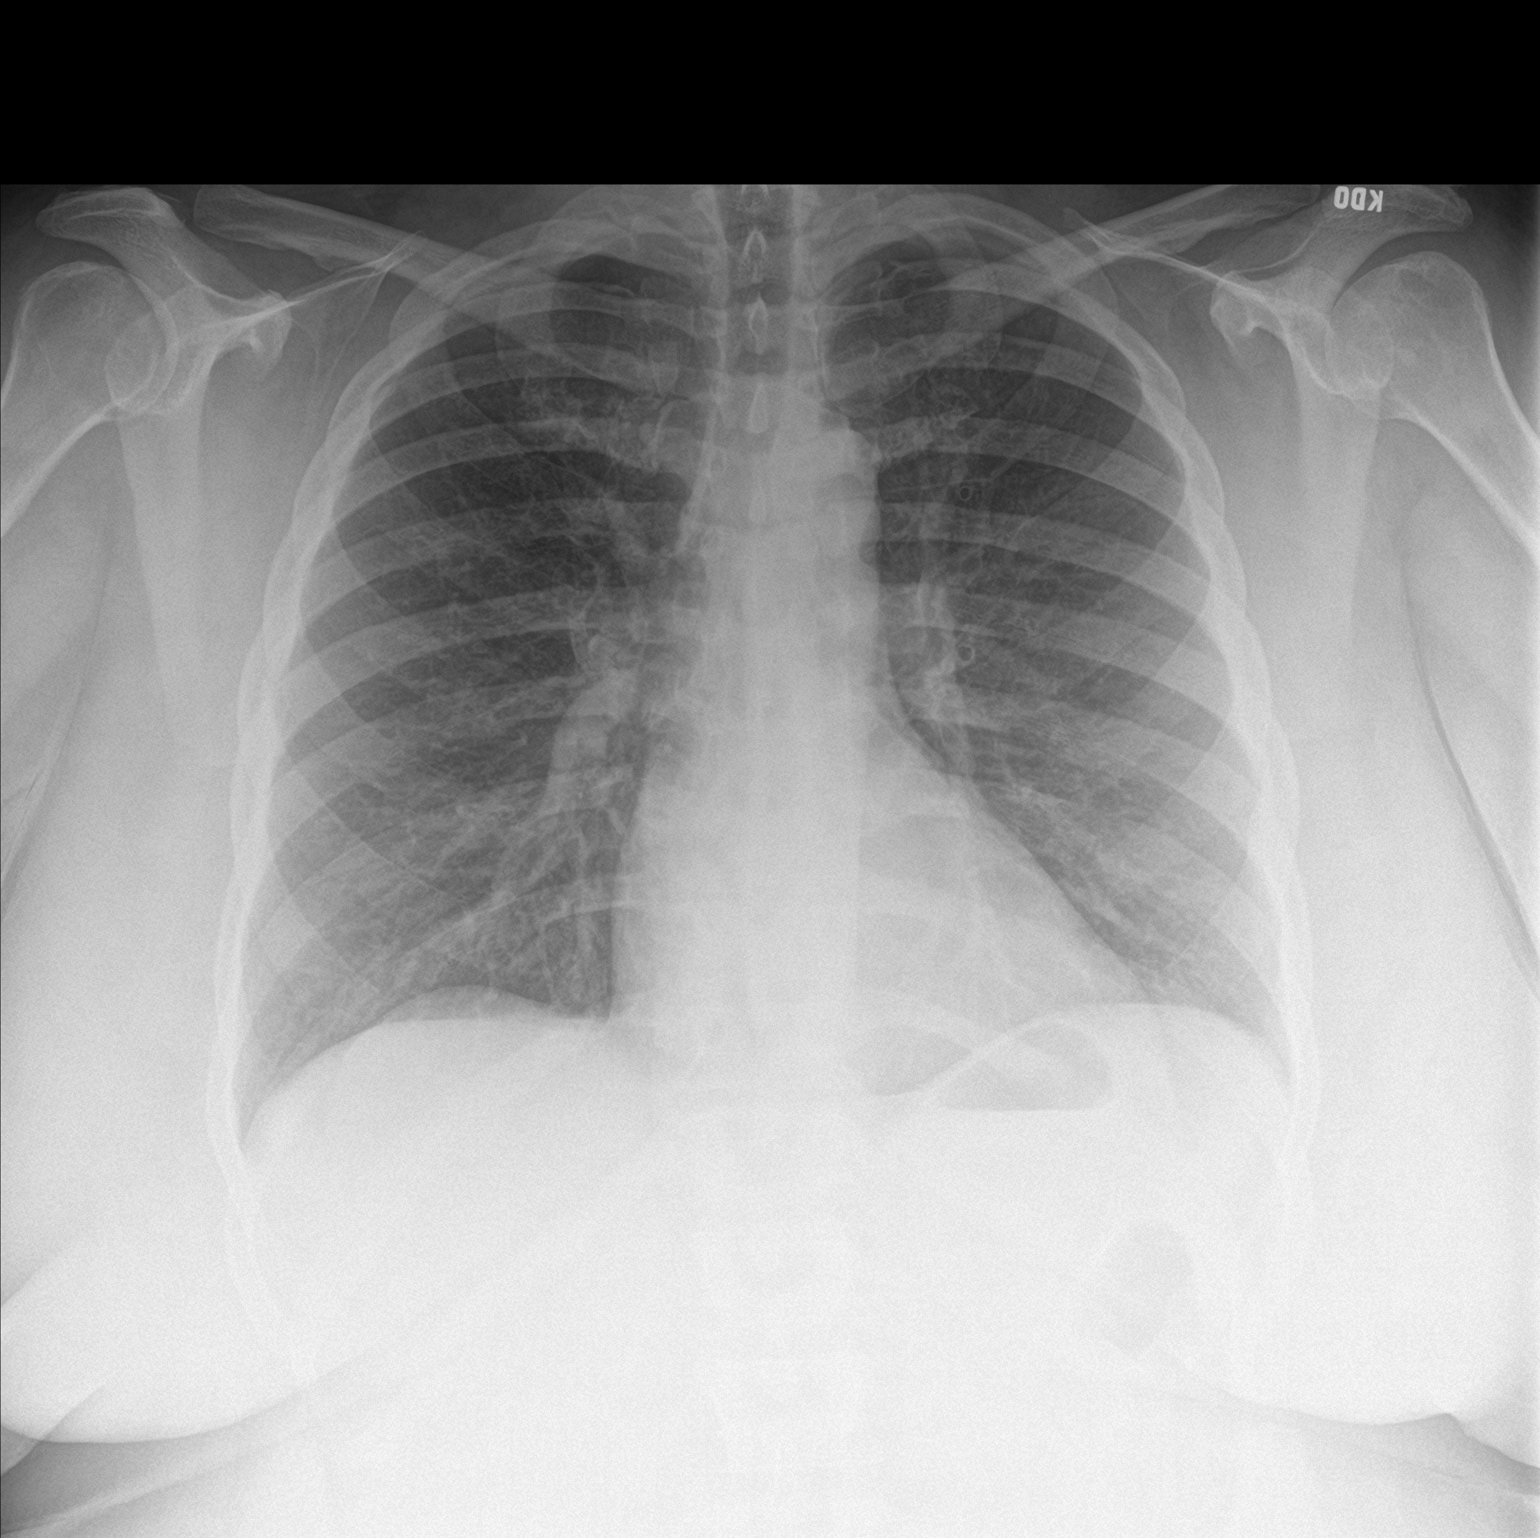

[chest lat]
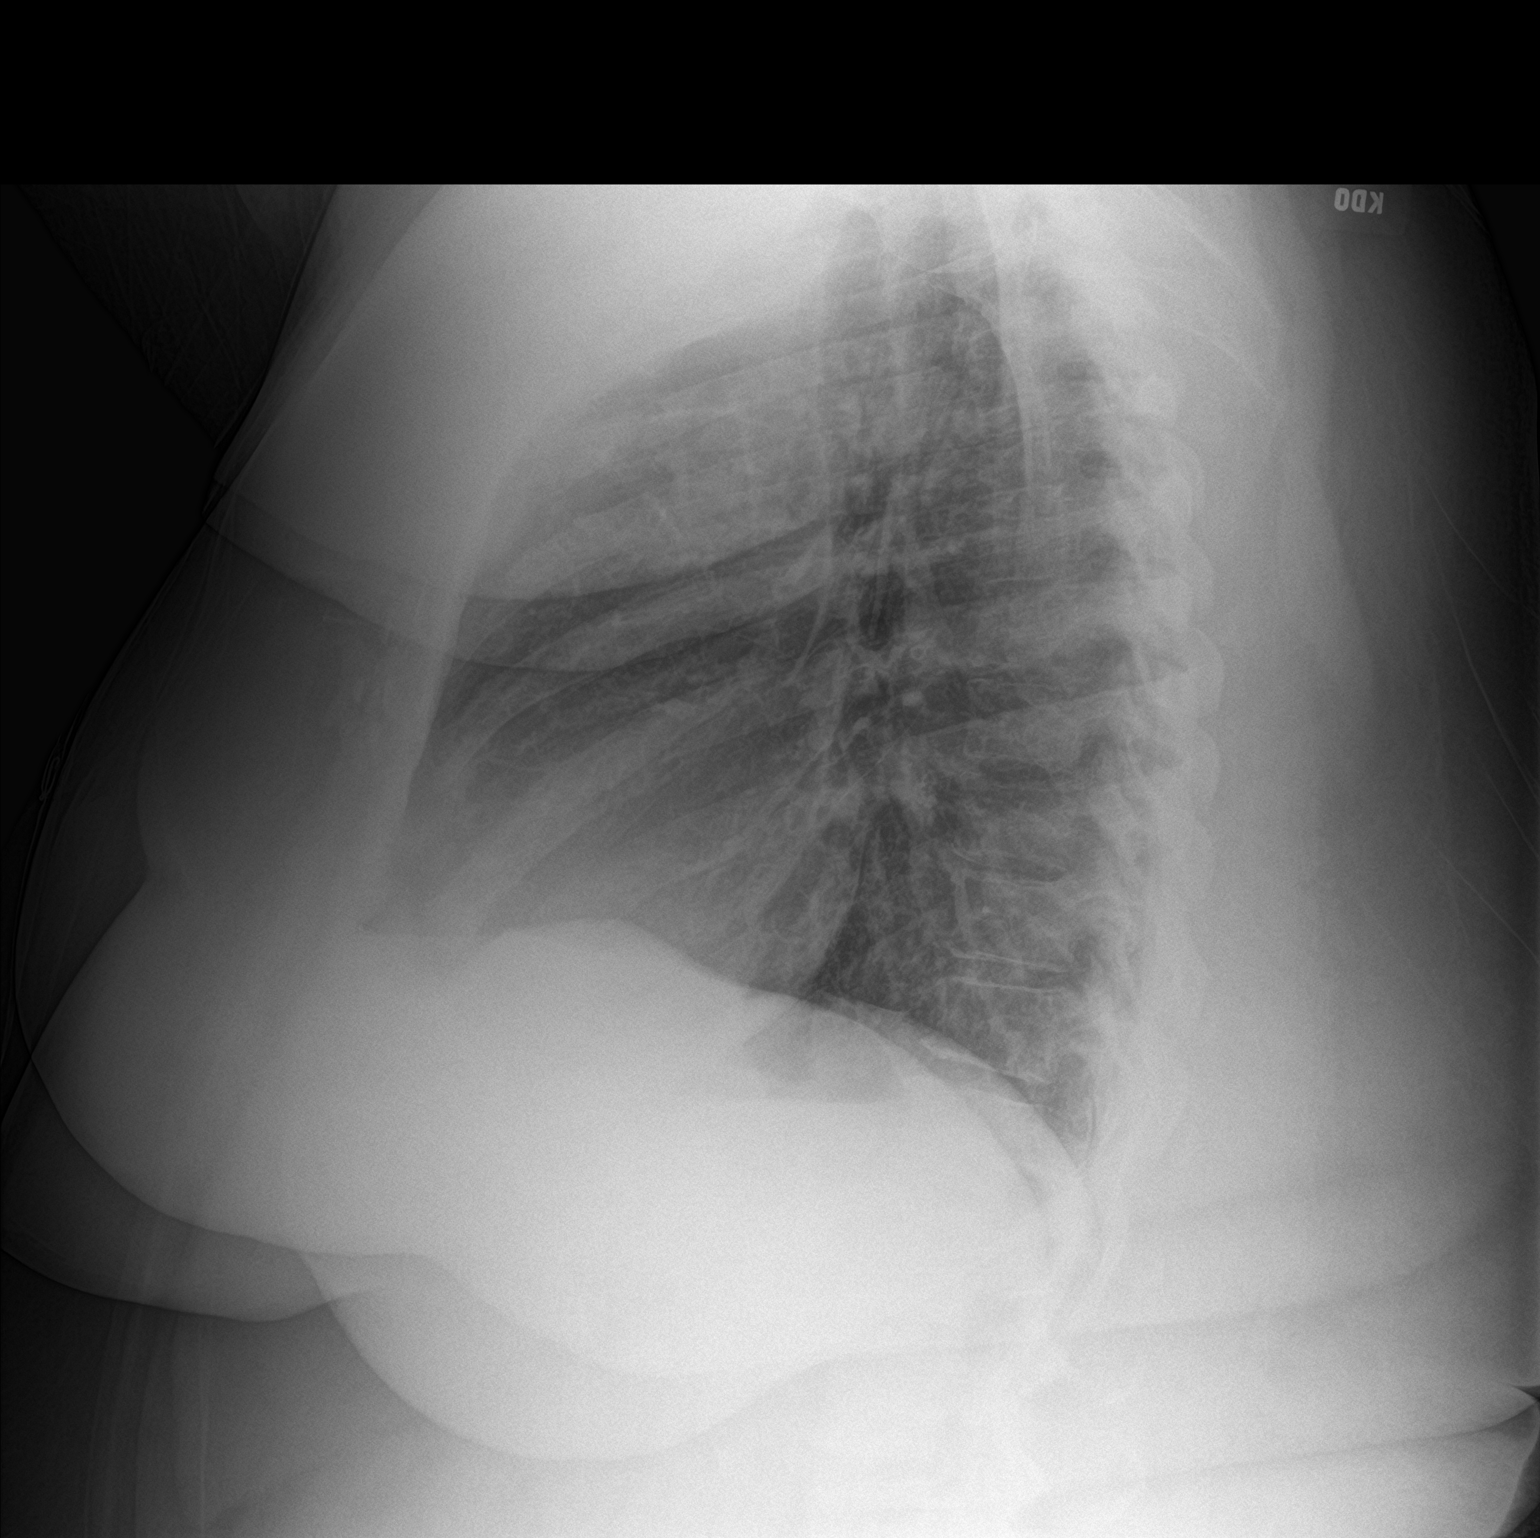

[2 of 2 positions shown; findings below may reference images not displayed]

FINDINGS: The cardiomediastinal silhouette is within normal limits. The lungs
are well inflated. Mild peribronchial thickening is similar to the
prior study. No confluent airspace opacity, overt pulmonary edema,
pleural effusion, or pneumothorax is identified. No acute osseous
abnormality is seen.
IMPRESSION: Mild chronic peribronchial thickening which may reflect reactive
airways disease. No consolidation to suggest pneumonia.

## 2017-12-03 ENCOUNTER — Encounter: Payer: Self-pay | Admitting: Adult Health

## 2017-12-03 ENCOUNTER — Non-Acute Institutional Stay (SKILLED_NURSING_FACILITY): Payer: Medicaid Other | Admitting: Adult Health

## 2017-12-03 DIAGNOSIS — I482 Chronic atrial fibrillation, unspecified: Secondary | ICD-10-CM

## 2017-12-03 DIAGNOSIS — I5032 Chronic diastolic (congestive) heart failure: Secondary | ICD-10-CM | POA: Diagnosis not present

## 2017-12-03 DIAGNOSIS — J4541 Moderate persistent asthma with (acute) exacerbation: Secondary | ICD-10-CM

## 2017-12-03 NOTE — Progress Notes (Signed)
Location:   [facility] Room Number: 107 Place of Service:  SNF (31)    CODE STATUS: full code   Allergies  Allergen Reactions  . Contrast Media [Iodinated Diagnostic Agents] Itching    Ct contrast  . Oxycodone-Acetaminophen Itching    Chief Complaint  Patient presents with  . Discharge Note    discharge to home on 12-05-17    HPI:  She is being discharged to home with home health for rn. She does not need any dme; she has all necessary equipment. She will need her prescriptions to be written and will need to follow up with her primary care provider. She had a complicated hospitalization due to ARDS and influenza. She was admitted to this facility for short term rehab. She has continued to improve since her admission to this facility and is now ready for discharge to home.    Past Medical History:  Diagnosis Date  . Acanthosis nigricans   . Anxiety   . Arthritis    "knees" (04/28/2017)  . Asthma    Followed by Dr. Annamaria Boots (pulmonology); receives every other week omalizumab injections; has frequent exacerbations  . Chronic diastolic CHF (congestive heart failure) ([place]) 01/17/2017  . COPD (chronic obstructive pulmonary disease) ([place])    PFTs in 2002, FEV1/FVC 65, no post bronchodilater test done  . Depression   . GERD (gastroesophageal reflux disease)   . Headache(784.0)    "q couple days" (04/28/2017)  . Helicobacter pylori (H. pylori) infection   . Hypertension, essential   . Insomnia   . Menorrhagia   . Morbid obesity ([place])   . OSA on CPAP    Sleep study 2008 - mild OSA, not enough events to titrate CPAP; wears CPAP now/pt on 04/28/2017  . Pneumonia X 1  . Seasonal allergies   . Shortness of breath   . Tobacco user     Past Surgical History:  Procedure Laterality Date  . CARDIOVERSION N/A 05/30/2017   Procedure: CARDIOVERSION;  Surgeon: Sanda Klein, MD;  Location: [facility];  Service: Cardiovascular;  Laterality: N/A;  . REDUCTION  MAMMAPLASTY Bilateral 09/2011  . TUBAL LIGATION  1996   bilateral    Social History   Socioeconomic History  . Marital status: Single    Spouse name: Not on file  . Number of children: 1  . Years of education: Not on file  . Highest education level: Not on file  Occupational History  . Occupation: CNA  Social Needs  . Financial resource strain: Not on file  . Food insecurity:    Worry: Not on file    Inability: Not on file  . Transportation needs:    Medical: Not on file    Non-medical: Not on file  Tobacco Use  . Smoking status: Former Smoker    Packs/day: 0.50    Years: 26.00    Pack years: 13.00    Types: Cigarettes    Last attempt to quit: 09/12/2014    Years since quitting: 3.2  . Smokeless tobacco: Never Used  Substance and Sexual Activity  . Alcohol use: No  . Drug use: No  . Sexual activity: Never  Lifestyle  . Physical activity:    Days per week: Not on file    Minutes per session: Not on file  . Stress: Not on file  Relationships  . Social connections:    Talks on phone: Not on file    Gets together: Not on file  Attends religious service: Not on file    Active member of club or organization: Not on file    Attends meetings of clubs or organizations: Not on file    Relationship status: Not on file  . Intimate partner violence:    Fear of current or ex partner: Not on file    Emotionally abused: Not on file    Physically abused: Not on file    Forced sexual activity: Not on file  Other Topics Concern  . 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   Family History  Problem Relation Age of Onset  . Hypertension Mother   . Asthma Daughter   . Cancer Paternal Aunt   . Asthma Maternal Grandmother     VITAL SIGNS BP 121/71   Pulse 87   Temp (!) 96.8 F (36 C)   Resp 18   Ht 5\' 6"  (1.676 m)   Wt 295 lb 6.4 oz (134 kg)   SpO2 95%   BMI 47.68 kg/m   Patient's Medications  New Prescriptions     No medications on file  Previous Medications   ACETAMINOPHEN (TYLENOL) 325 MG TABLET    Take 650 mg by mouth every 6 (six) hours as needed for mild pain.   APIXABAN (ELIQUIS) 5 MG TABS TABLET    Take 5 mg by mouth 2 (two) times daily.   CETIRIZINE (ZYRTEC) 10 MG TABLET    Take 10 mg by mouth daily.   FLUTICASONE (FLONASE) 50 MCG/ACT NASAL SPRAY    Place 1 spray into both nostrils daily.   FLUTICASONE-SALMETEROL (ADVAIR) 250-50 MCG/DOSE AEPB    Inhale 1 puff into the lungs 2 (two) times daily.   FUROSEMIDE (LASIX) 40 MG TABLET    Take 40 mg by mouth daily.   GUAIFENESIN (MUCINEX) 600 MG 12 HR TABLET    Take 2 tablets (1,200 mg total) by mouth 2 (two) times daily.   IPRATROPIUM-ALBUTEROL (DUONEB) 0.5-2.5 (3) MG/3ML SOLN    Inhale 3 ml two times daily and every 2 hours as needed for shortness of breath or wheezing   MAGNESIUM OXIDE (MAG-OX) 400 MG TABLET    Take 400 mg by mouth 3 (three) times daily.    MELATONIN 3 MG TABS    Take 1 tablet by mouth at bedtime.   MENTHOL-CETYLPYRIDINIUM (CEPACOL) 3 MG LOZENGE    Take 1 lozenge by mouth every 4 (four) hours as needed for sore throat.   MONTELUKAST (SINGULAIR) 10 MG TABLET    Take 10 mg by mouth at bedtime.   MULTIPLE VITAMIN (MULTIVITAMIN WITH MINERALS) TABS TABLET    Take 1 tablet by mouth daily.   ONDANSETRON (ZOFRAN) 4 MG TABLET    Take 4 mg by mouth 3 (three) times daily before meals.    PANTOPRAZOLE (PROTONIX) 40 MG TABLET    Take 40 mg by mouth daily.   POTASSIUM CHLORIDE ER 20 MEQ TBCR    Take 20 mEq by mouth 2 (two) times daily.    SALINE NASAL SPRAY NA    Place 1 spray into both nostrils every 2 (two) hours as needed.   SERTRALINE (ZOLOFT) 100 MG TABLET    Take 100 mg by mouth daily.   ZOLPIDEM (AMBIEN) 5 MG TABLET    Take 1 tablet (5 mg total) by mouth at bedtime.  Modified Medications   No medications on file  Discontinued Medications   No medications on file  SIGNIFICANT DIAGNOSTIC EXAMS   PREVIOUS:   10-08-17: chest  x-ray: Cardiomegaly with overall improving aeration. There could be residual LEFT lower lobe infiltrate.  10-08-17: ct of chest: 1. No acute cardiopulmonary disease. 2. Mild bilateral chronic interstitial lung disease.  10-08-17: ct angio of chest: 1. No pulmonary embolus to the segmental level. 2. Lung parenchymal better assessed on noncontrast CT earlier this day given degree of motion on the current exam. Findings suspicious for interstitial lung disease again seen.  10-11-17: chest x-ray: 1. No definite acute findings. 2. Mild interstitial prominence, as on 10/08/2017.  10-13-17: ct of soft tissue of neck: Noncontrast CT of the neck demonstrates no mass or airway Compromise.  10-14-17: chest x-ray: No acute cardiopulmonary abnormality seen.   11-04-17: barium swallow: Normal esophageal peristalsis. No fixed esophageal narrowing or stricture. No evidence of hiatal hernia. No gastroesophageal reflux was demonstrated.  NO NEW EXAMS.    LABS REVIEWED: PREVIOUS:   10-08-17: wbc 15.8; hgb 12.6; hct 39.1; mcv 84.3; plt 401; glucose 94; bun <5; creat 0.93; k+ 2.7; na++ 138; ca 8.3; mag 1.3; d-dimer: 0.93 10-10-17: wbc 11.5; hgb 10.6; hct 33.6; mcv 83.8; plt 398; glucose 82; bun <5; creat 0.67; k+ 3.7; na++ 139; ca 8.0 10-12-17: wbc 15.4; hgb 10.5; hct 33.1; mcv 83.4; plt 428; glucose 83; bun <0.5; creat 0.6; k+ 3.1; na++ 139; ca 8.0 10-15-17: wbc 17.2; hgb 10.7; hct 33.5; mcv 83.1; plt 451; glucose 87; bun <5; creat 0.67; k+ 3.2; na++ 137; ca 8.2; mag 1.7; phos 5.2; liver normal albumin 2.3  10-20-17: wbc 16.1; hgb 11.4; hct 35; plt 474; glucose 69; bun 2; creat 0.6; k+ 3.4; na++ 137; liver normal  10-27-17: glucose 75; bun 4.1; k+ 3.2; na++ 139; ca 8.0; mag 1.5  11-03-17: wbc 14.5; hgb 10.7; hct 34; plt 424; glucose 92; bun 2; creat 0.6; k+ 4.2; na++ 139   NO NEW LABS.   Review of Systems  Constitutional: Negative for malaise/fatigue.  Respiratory: Negative for cough and shortness of breath.     Cardiovascular: Negative for chest pain, palpitations and leg swelling.  Gastrointestinal: Negative for abdominal pain, constipation and heartburn.  Musculoskeletal: Negative for back pain, joint pain and myalgias.  Skin: Negative.   Neurological: Negative for dizziness.  Psychiatric/Behavioral: The patient is not nervous/anxious.    Physical Exam  Constitutional: She is oriented to person, place, and time. She appears well-developed and well-nourished. No distress.  Morbid obesity   Neck: No thyromegaly present.  Cardiovascular: Normal rate, regular rhythm and intact distal pulses.  Murmur heard. 1/6  Pulmonary/Chest: Effort normal and breath sounds normal. No respiratory distress.  Abdominal: Soft. Bowel sounds are normal. She exhibits no distension. There is no tenderness.  Musculoskeletal: Normal range of motion. She exhibits no edema.  Lymphadenopathy:    She has no cervical adenopathy.  Neurological: She is alert and oriented to person, place, and time.  Skin: Skin is warm and dry. She is not diaphoretic.  Psychiatric: She has a normal mood and affect.    ASSESSMENT/ PLAN:  Patient is being discharged with the following home health services:  RN to evaluate and treat as indicated for medication management   Patient is being discharged with the following durable medical equipment:  None needed   Patient has been advised to f/u with their PCP in 1-2 weeks to bring them up to date on their rehab stay.  Social services at facility was responsible for arranging this appointment.  Pt was provided with  a 30 day supply of prescriptions for medications and refills must be obtained from their PCP.  For controlled substances, a more limited supply may be provided adequate until PCP appointment only.  A 30 day supply of prescription medications have been written as listed above.    Time spent with patient: 35 minutes: discussed home health needs and expectations; no dme needed;  medications. Verbalized understanding.    Ok Edwards NP [facility]  Contact [phone] Monday through Friday 8am- 5pm  After hours call [phone]

## 2017-12-08 ENCOUNTER — Ambulatory Visit: Payer: Medicaid Other | Admitting: Family Medicine

## 2017-12-08 ENCOUNTER — Telehealth: Payer: Self-pay

## 2017-12-08 NOTE — Telephone Encounter (Signed)
Call placed to the patient at the request of Dr Margarita Rana, inquiring if the patient could come in earlier today. Voicemail message left requesting a call back to # [phone].

## 2017-12-15 ENCOUNTER — Telehealth: Payer: Self-pay

## 2017-12-15 NOTE — Telephone Encounter (Signed)
Call received from the patient. She said that she is feeling better and is starting to get her strength back in her legs. She noted that she is still having problems getting back up the stairs to her apartment due to her leg weakness.    She is requesting a cane. Informed her that Dr Margarita Rana would be notified.  She is also requesting a refill of her zolpidem.    She said that her PCS services have stopped. She called [employer] [facility] and they told her that a new referral is needed.  Informed her that it has been > 90 days since she was seen by Dr Margarita Rana, so she will need to be seen prior to submitting a new referral. Also reminded her that she missed her appointment with Dr Margarita Rana last week. Rescheduled her for another appointment 12/24/17 @ 1410. Instructed her to call Medicaid to schedule transportation and provided her with the phone # for medicaid.

## 2017-12-16 NOTE — Telephone Encounter (Signed)
Rx for cane has been written. What pharmacy is the Ambien going to?

## 2017-12-17 ENCOUNTER — Telehealth: Payer: Self-pay

## 2017-12-17 NOTE — Telephone Encounter (Signed)
Call placed to patient to inquire where she would like her ambien prescription to be sent.  She requested [facility].  Also informed her that the prescription for the cane has been faxed to [facility]

## 2017-12-18 MED ORDER — ZOLPIDEM TARTRATE 5 MG PO TABS: 5.0000 mg | ORAL_TABLET | Freq: Every day | ORAL | 2 refills | Status: DC

## 2017-12-18 NOTE — Addendum Note (Signed)
Addended byCharlott Rakes on: 12/18/2017 08:04 AM   Modules accepted: Orders

## 2017-12-18 NOTE — Telephone Encounter (Signed)
Done

## 2017-12-19 ENCOUNTER — Telehealth: Payer: Self-pay

## 2017-12-19 NOTE — Telephone Encounter (Signed)
Call placed to [facility], spoke to [facility] who confirmed receipt of the order for the cane

## 2017-12-24 ENCOUNTER — Ambulatory Visit: Payer: Medicaid Other | Attending: Family Medicine | Admitting: Family Medicine

## 2017-12-24 ENCOUNTER — Encounter: Payer: Self-pay | Admitting: Family Medicine

## 2017-12-24 ENCOUNTER — Telehealth: Payer: Self-pay

## 2017-12-24 VITALS — BP 128/81 | HR 86 | Temp 97.6°F | Ht 66.0 in | Wt 287.6 lb

## 2017-12-24 DIAGNOSIS — F329 Major depressive disorder, single episode, unspecified: Secondary | ICD-10-CM | POA: Diagnosis not present

## 2017-12-24 DIAGNOSIS — F419 Anxiety disorder, unspecified: Secondary | ICD-10-CM | POA: Diagnosis not present

## 2017-12-24 DIAGNOSIS — J449 Chronic obstructive pulmonary disease, unspecified: Secondary | ICD-10-CM | POA: Diagnosis not present

## 2017-12-24 DIAGNOSIS — J441 Chronic obstructive pulmonary disease with (acute) exacerbation: Secondary | ICD-10-CM

## 2017-12-24 DIAGNOSIS — Z91041 Radiographic dye allergy status: Secondary | ICD-10-CM | POA: Diagnosis not present

## 2017-12-24 DIAGNOSIS — G4733 Obstructive sleep apnea (adult) (pediatric): Secondary | ICD-10-CM | POA: Insufficient documentation

## 2017-12-24 DIAGNOSIS — J45901 Unspecified asthma with (acute) exacerbation: Secondary | ICD-10-CM | POA: Diagnosis not present

## 2017-12-24 DIAGNOSIS — G4700 Insomnia, unspecified: Secondary | ICD-10-CM | POA: Diagnosis not present

## 2017-12-24 DIAGNOSIS — Z9889 Other specified postprocedural states: Secondary | ICD-10-CM | POA: Diagnosis not present

## 2017-12-24 DIAGNOSIS — I482 Chronic atrial fibrillation, unspecified: Secondary | ICD-10-CM

## 2017-12-24 DIAGNOSIS — I5032 Chronic diastolic (congestive) heart failure: Secondary | ICD-10-CM | POA: Diagnosis not present

## 2017-12-24 DIAGNOSIS — Z6841 Body Mass Index (BMI) 40.0 and over, adult: Secondary | ICD-10-CM | POA: Diagnosis not present

## 2017-12-24 DIAGNOSIS — Z79899 Other long term (current) drug therapy: Secondary | ICD-10-CM | POA: Diagnosis not present

## 2017-12-24 DIAGNOSIS — I110 Hypertensive heart disease with heart failure: Secondary | ICD-10-CM | POA: Insufficient documentation

## 2017-12-24 DIAGNOSIS — M170 Bilateral primary osteoarthritis of knee: Secondary | ICD-10-CM | POA: Insufficient documentation

## 2017-12-24 DIAGNOSIS — Z883 Allergy status to other anti-infective agents status: Secondary | ICD-10-CM | POA: Diagnosis not present

## 2017-12-24 DIAGNOSIS — K219 Gastro-esophageal reflux disease without esophagitis: Secondary | ICD-10-CM | POA: Insufficient documentation

## 2017-12-24 DIAGNOSIS — Z7901 Long term (current) use of anticoagulants: Secondary | ICD-10-CM | POA: Insufficient documentation

## 2017-12-24 DIAGNOSIS — I10 Essential (primary) hypertension: Secondary | ICD-10-CM

## 2017-12-24 DIAGNOSIS — Z09 Encounter for follow-up examination after completed treatment for conditions other than malignant neoplasm: Secondary | ICD-10-CM | POA: Insufficient documentation

## 2017-12-24 MED ORDER — PANTOPRAZOLE SODIUM 40 MG PO TBEC: 40.0000 mg | DELAYED_RELEASE_TABLET | Freq: Every day | ORAL | 3 refills | Status: DC

## 2017-12-24 NOTE — Telephone Encounter (Signed)
Met with the patient when she was in the clinic today.  She was accompanied by her sister. She said that she is still living with her mother and is getting used to navigating the flight of stairs to her mother's apartment.  She was  Inquiring about the status of the order for her cane. Informed her that this CM would check the status with [facility].  The patient said that she could pick it up at the [facility] store today   The patient also noted that she would like a referral for PCS as the services were terminated when she was hospitalized for an extended period of time. Dr Margarita Rana in agreement with PCS.  Call placed to [facility] to inquire about the status of the cane.  Spoke to [place] who stated that the cane could be picked up today or tomorrow at [facility] office on [address].  Call placed to the patient as she had already left the clinic and informed her of the conversation with [facility].  The patient said that she would probably pick it up tomorrow.

## 2017-12-24 NOTE — Progress Notes (Signed)
Subjective:  Patient ID: April [name], female    DOB: 1970/12/06  Age: 47 y.o. MRN: [id number]  CC: Hospitalization Follow-up   HPI April [name] is a 47 year old female with a history of morbid obesity, major depressive disorder, hypertension, COPD/ Asthma , obstructive sleep apnea (on CPAP) with multiple hospitalizations for asthma exacerbation/COPD , history of ARDS, influenza and health associated pneumonia requiring intubation, tracheostomy and ECMO at [facility] who presents for follow-up visit today after being hospitalized at  [facility] from 10/08/2017 through 10/15/2017 for acute sinusitis, deconditioning. She had presented with hypotension and was found to have acute sinusitis for which she was placed on antibiotics and nebulizer treatments as she had leukocytosis.  Respiratory viral panel was negative, blood culture revealed no growth to date. D-dimer was elevated, CT angiogram was negative for PE; she underwent physical therapy and was subsequently discharged to a skilled nursing facility where she was discharged earlier this month.  She presents today stating she feels well and is not short of breath and has no chest pains or wheezing; seen by her pulmonologist Dr. Annamaria Boots on 11/07/2017.  She complains of severe pain in her knees which are worse with ambulation.  Previously followed by orthopedics, Dr. Erlinda Hong and CT of the left knee reveals advanced tricompartmental osteoarthritis.  She had been offered surgical management in the past however she is not inclined to doing this at this time as she is tired of being hospitalized. She is requesting a prescription for a cane which have [facility] as she has to hold onto things while walking but is not open to using a walker. She is also [facility]. Reflux symptoms have been uncontrolled on omeprazole and she is requesting something else  Past Medical  History:  Diagnosis Date  . Acanthosis nigricans   . Anxiety   . Arthritis    "knees" (04/28/2017)  . Asthma    Followed by Dr. Annamaria Boots (pulmonology); receives every other week omalizumab injections; has frequent exacerbations  . Chronic diastolic CHF (congestive heart failure) ([place]) 01/17/2017  . COPD (chronic obstructive pulmonary disease) ([place])    PFTs in 2002, FEV1/FVC 65, no post bronchodilater test done  . Depression   . GERD (gastroesophageal reflux disease)   . Headache(784.0)    "q couple days" (04/28/2017)  . Helicobacter pylori (H. pylori) infection   . Hypertension, essential   . Insomnia   . Menorrhagia   . Morbid obesity ([place])   . OSA on CPAP    Sleep study 2008 - mild OSA, not enough events to titrate CPAP; wears CPAP now/pt on 04/28/2017  . Pneumonia X 1  . Seasonal allergies   . Shortness of breath   . Tobacco user     Past Surgical History:  Procedure Laterality Date  . CARDIOVERSION N/A 05/30/2017   Procedure: CARDIOVERSION;  Surgeon: Sanda Klein, MD;  Location: [facility];  Service: Cardiovascular;  Laterality: N/A;  . REDUCTION MAMMAPLASTY Bilateral 09/2011  . TUBAL LIGATION  1996   bilateral    Allergies  Allergen Reactions  . Contrast Media [Iodinated Diagnostic Agents] Itching    Ct contrast  . Oxycodone-Acetaminophen Itching     Outpatient Medications Prior to Visit  Medication Sig Dispense Refill  . acetaminophen (TYLENOL) 325 MG tablet Take 650 mg by mouth every 6 (six) hours as needed for mild pain.    [name] apixaban (ELIQUIS) 5 MG TABS  tablet Take 5 mg by mouth 2 (two) times daily.    . cetirizine (ZYRTEC) 10 MG tablet Take 10 mg by mouth daily.    . fluticasone (FLONASE) 50 MCG/ACT nasal spray Place 1 spray into both nostrils daily.  2  . Fluticasone-Salmeterol (ADVAIR) 250-50 MCG/DOSE AEPB Inhale 1 puff into the lungs 2 (two) times daily.    . furosemide (LASIX) 40 MG tablet Take 40 mg by mouth daily.    [name] guaiFENesin (MUCINEX) 600 MG  12 hr tablet Take 2 tablets (1,200 mg total) by mouth 2 (two) times daily.    [name] ipratropium-albuterol (DUONEB) 0.5-2.5 (3) MG/3ML SOLN Inhale 3 ml two times daily and every 2 hours as needed for shortness of breath or wheezing    . magnesium oxide (MAG-OX) 400 MG tablet Take 400 mg by mouth 3 (three) times daily.     . Melatonin 3 MG TABS Take 1 tablet by mouth at bedtime.    . montelukast (SINGULAIR) 10 MG tablet Take 10 mg by mouth at bedtime.    . Multiple Vitamin (MULTIVITAMIN WITH MINERALS) TABS tablet Take 1 tablet by mouth daily.    . ondansetron (ZOFRAN) 4 MG tablet Take 4 mg by mouth 3 (three) times daily before meals.     . Potassium Chloride ER 20 MEQ TBCR Take 20 mEq by mouth 2 (two) times daily.     [name] SALINE NASAL SPRAY NA Place 1 spray into both nostrils every 2 (two) hours as needed.    . sertraline (ZOLOFT) 100 MG tablet Take 100 mg by mouth daily.    [name] zolpidem (AMBIEN) 5 MG tablet Take 1 tablet (5 mg total) by mouth at bedtime. 30 tablet 2  . pantoprazole (PROTONIX) 40 MG tablet Take 40 mg by mouth daily.    [name] menthol-cetylpyridinium (CEPACOL) 3 MG lozenge Take 1 lozenge by mouth every 4 (four) hours as needed for sore throat.     No facility-administered medications prior to visit.     ROS Review of Systems  Constitutional: Negative for activity change, appetite change and fatigue.  HENT: Negative for congestion, sinus pressure and sore throat.   Eyes: Negative for visual disturbance.  Respiratory: Negative for cough, chest tightness, shortness of breath and wheezing.   Cardiovascular: Negative for chest pain and palpitations.  Gastrointestinal: Negative for abdominal distention, abdominal pain and constipation.  Endocrine: Negative for polydipsia.  Genitourinary: Negative for dysuria and frequency.  Musculoskeletal:       See hpi  Skin: Negative for rash.  Neurological: Negative for tremors, light-headedness and numbness.  Hematological: Does not bruise/bleed  easily.  Psychiatric/Behavioral: Negative for agitation and behavioral problems.    Objective:  BP 128/81   Pulse 86   Temp 97.6 F (36.4 C) (Oral)   Ht 5\' 6"  (1.676 m)   Wt 287 lb 9.6 oz (130.5 kg)   SpO2 100%   BMI 46.42 kg/m   BP/Weight 12/24/2017 12/03/2017 3/66/2947  Systolic BP 654 650 354  Diastolic BP 81 71 76  Wt. (Lbs) 287.6 295.4 298.5  BMI 46.42 47.68 51.24  Some encounter information is confidential and restricted. Go to Review Flowsheets activity to see all data.     Physical Exam  Constitutional: She is oriented to person, place, and time. She appears well-developed and well-nourished.  Cardiovascular: Normal rate, normal heart sounds and intact distal pulses.  No murmur heard. Pulmonary/Chest: Effort normal and breath sounds normal. She has no wheezes. She has no rales. She exhibits no  tenderness.  Abdominal: Soft. Bowel sounds are normal. She exhibits no distension and no mass. There is no tenderness.  Musculoskeletal:  No edema Valgus deformity of both knees with tenderness to palpation of anterior knee and medial joint lines bilaterally.  Crepitus on range of motion.  Neurological: She is alert and oriented to person, place, and time.  Skin: Skin is warm and dry.    EXAM: CT OF THE LEFT KNEE WITHOUT CONTRAST  TECHNIQUE: Multidetector CT imaging of the left knee was performed according to the standard protocol. Multiplanar CT image reconstructions were also generated.  COMPARISON:  None.  FINDINGS: Bones/Joint/Cartilage  There is no acute bony abnormality. The patient has advanced for age tricompartmental osteoarthritis with subchondral sclerosis and osteophytosis appearing worst about the medial and lateral compartments. Medial compartment joint space narrowing is noted. Loose body in the posterior aspect of the joint centrally measures 1 cm in diameter. Mild lateral subluxation of the patella is likely chronic and due to patellar tracking  abnormality. No focal bony lesion is identified.  Ligaments  Suboptimally assessed by CT.  Muscles and Tendons  Intact.  Soft tissues  Negative.  IMPRESSION: No acute abnormality.  Markedly advanced for age tricompartmental osteoarthritis.  Please note that the menisci and ligaments cannot be adequately assessed with CT scan.   Electronically Signed   By: Inge Rise M.D.   On: 10/04/2016 16:55  Assessment & Plan:   1. Chronic diastolic CHF (congestive heart failure) (HCC) Euvolemic Continue furosemide and potassium supplements  2. Chronic atrial fibrillation (HCC) On anticoagulation with Eliquis  3. COPD with acute exacerbation (HCC)/ Asthma No recent exacerbations, controlled on current regimen Continue Singulair, Advair, DuoNeb Followed by pulmonary  4. Bilateral primary osteoarthritis of knee Uncontrolled due to marked tricompartmental osteoarthritis Previously followed by orthopedics but is uninterested in surgical options at this time - Ambulatory referral to Pain Clinic  5. Essential hypertension Diet controlled   6. Gastroesophageal reflux disease without esophagitis Uncontrolled on omeprazole We will switch to Protonix - pantoprazole (PROTONIX) 40 MG tablet; Take 1 tablet (40 mg total) by mouth daily.  Dispense: 30 tablet; Refill: 3   Meds ordered this encounter  Medications  . pantoprazole (PROTONIX) 40 MG tablet    Sig: Take 1 tablet (40 mg total) by mouth daily.    Dispense:  30 tablet    Refill:  3    Follow-up: Return in about 6 weeks (around 02/04/2018) for coordination of care.   [name] MD

## 2017-12-25 ENCOUNTER — Telehealth: Payer: Self-pay

## 2017-12-25 NOTE — Telephone Encounter (Signed)
PCS form faxed to [facility] 

## 2017-12-26 IMAGING — CR DG CHEST 2V
2 series · 2 of 2 positions shown · non-contrast
Comparison: PA and lateral chest x-ray November 13, 2016

CLINICAL DATA: One week history of asthmatic symptoms including
shortness of breath. History of asthma -COPD. Former smoker.

EXAM:
CHEST  2 VIEW

[chest pa]
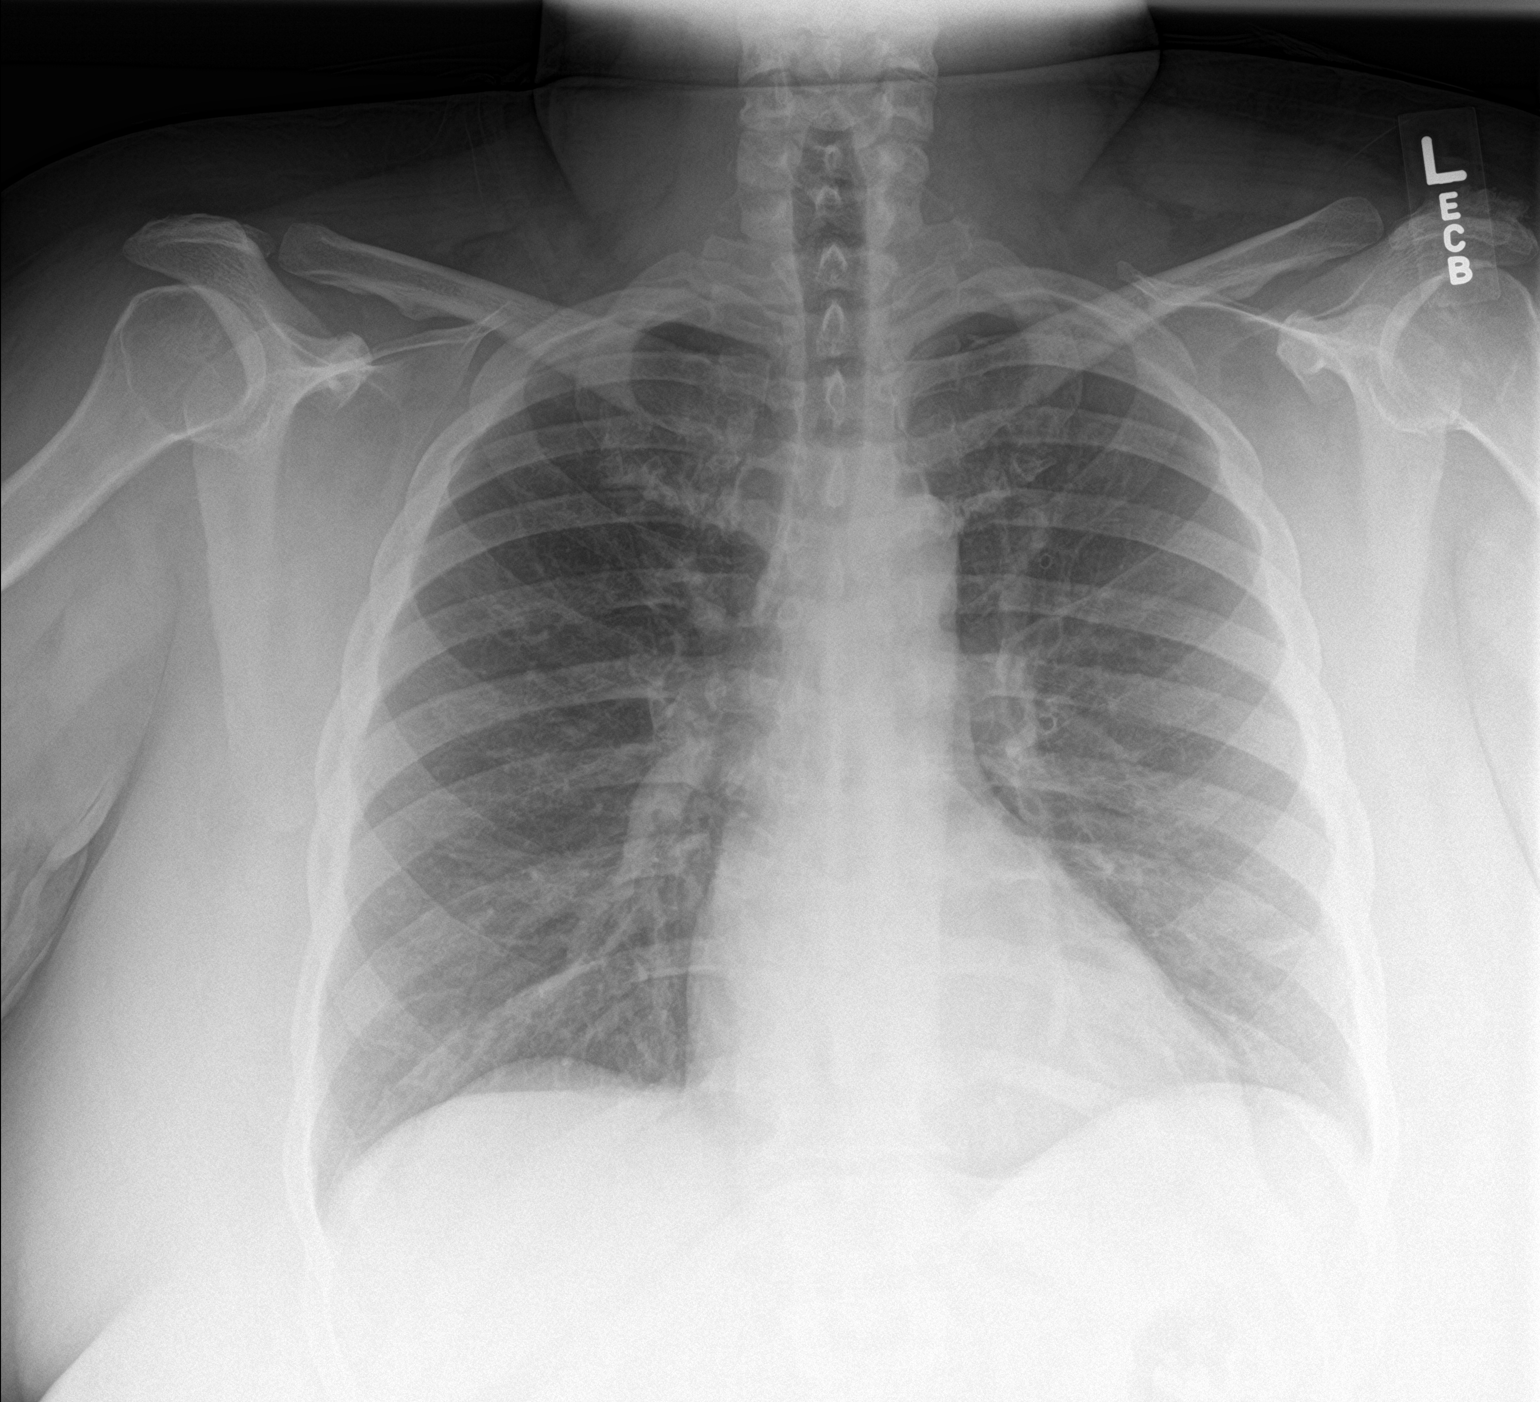

[chest lat]
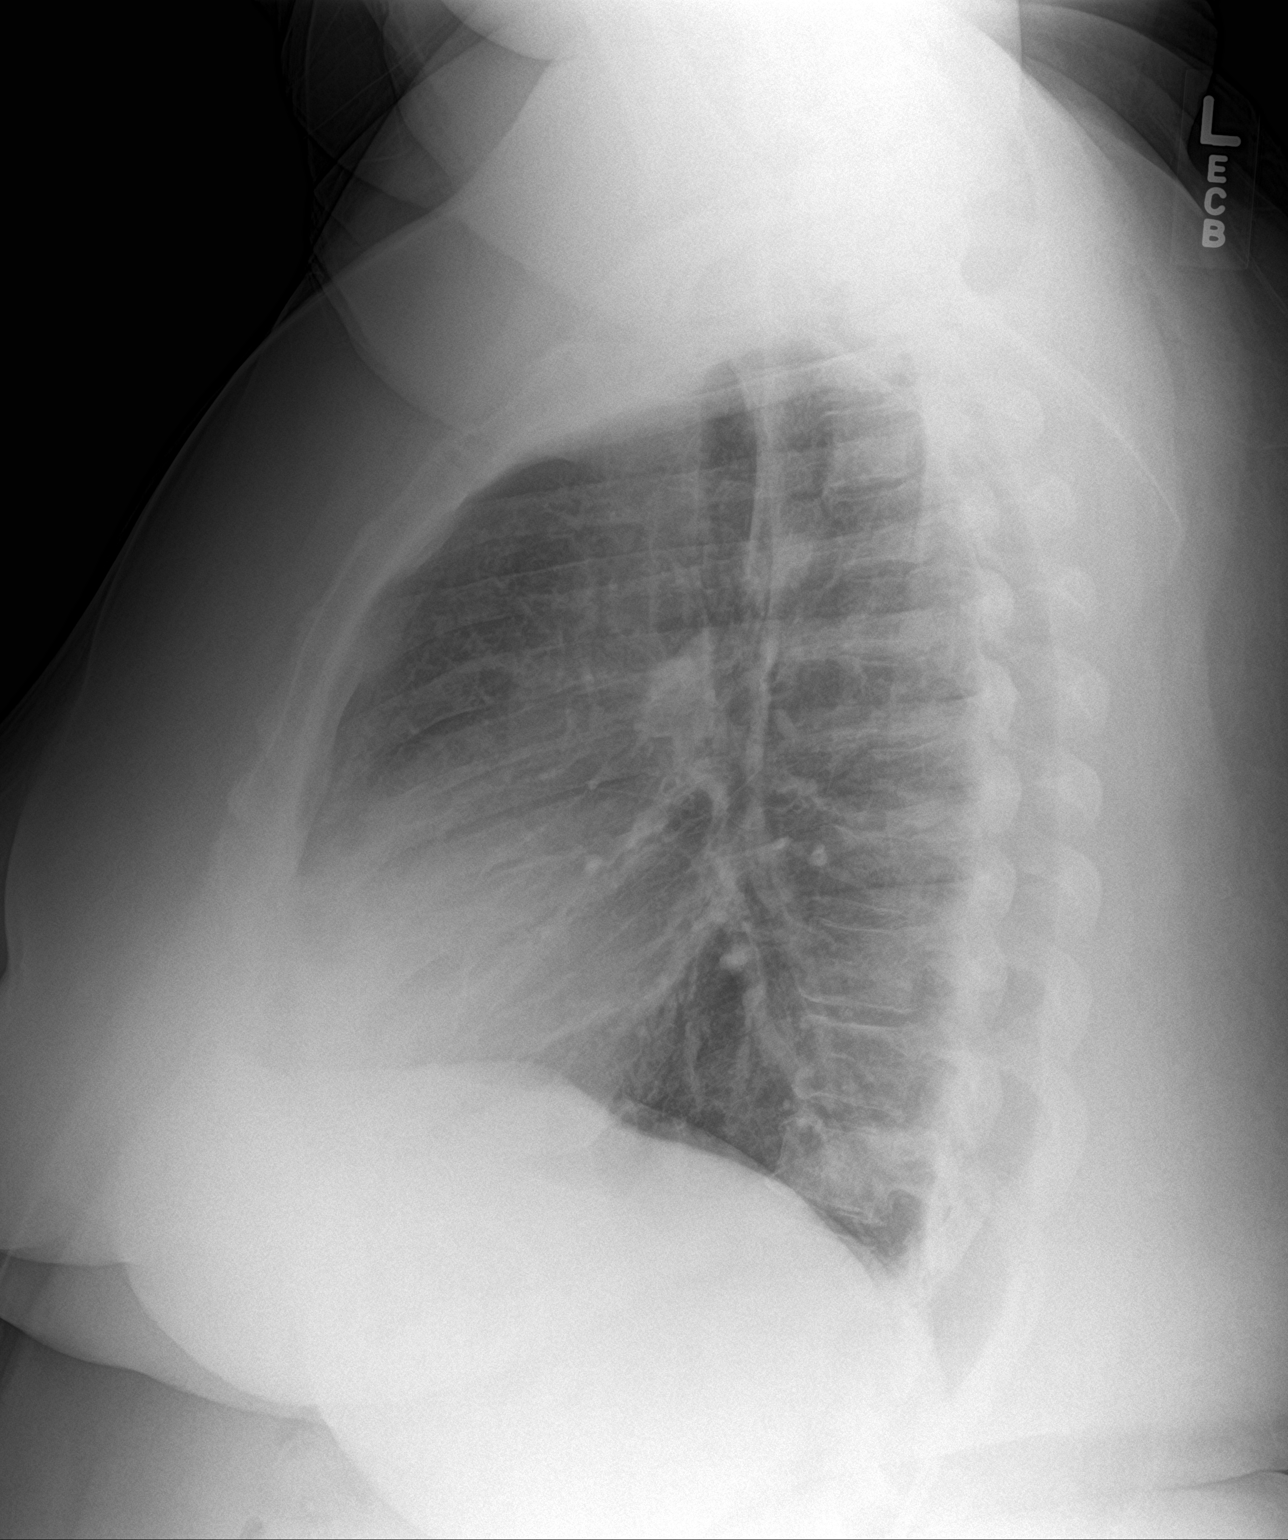

[2 of 2 positions shown; findings below may reference images not displayed]

FINDINGS: The lungs are adequately inflated. There are slightly increased lung
markings overlying the lower thoracic spine not clearly evident on
the frontal view. There is no pleural effusion. The heart and
pulmonary vascularity are normal. The trachea is midline.
IMPRESSION: Chronic bronchitic-reactive airway changes. Atelectasis or early
pneumonia posteriorly likely in the right lower lobe. Followup PA
and lateral chest X-ray is recommended in 3-4 weeks following trial
of antibiotic therapy to ensure resolution and exclude underlying
malignancy.

## 2018-01-01 ENCOUNTER — Telehealth: Payer: Self-pay

## 2018-01-01 NOTE — Telephone Encounter (Signed)
Call placed to [facility] , spoke to [place] who confirmed receipt of the referral and stated that an assessment has been scheduled.

## 2018-01-03 IMAGING — CT CT HEAD W/O CM
3 series · 15 of 47 positions shown, 18 images · non-contrast
Comparison: Head CT 10/20/2016

CLINICAL DATA: Acute onset was superior right-sided headache. Worst
headache of life.

EXAM:
CT HEAD WITHOUT CONTRAST
TECHNIQUE: Contiguous axial images were obtained from the base of the skull
through the vertex without intravenous contrast.

[Series 3: head 5.0 h30s · axial · 0.46mm/px · z∈[-169,-24]mm · 9 of 35 slices shown, 12 images]
[im 3/35  brain]
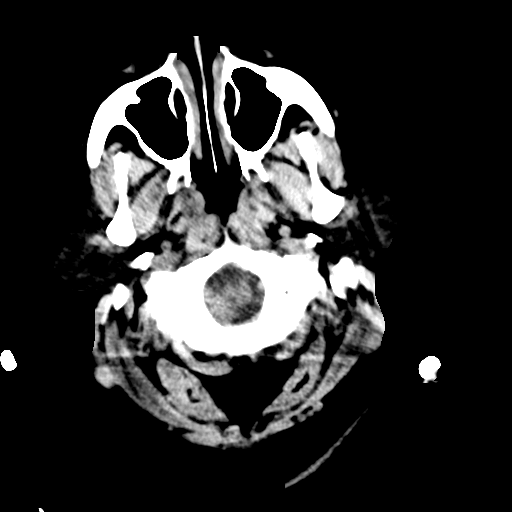
[im 3/35  bone]
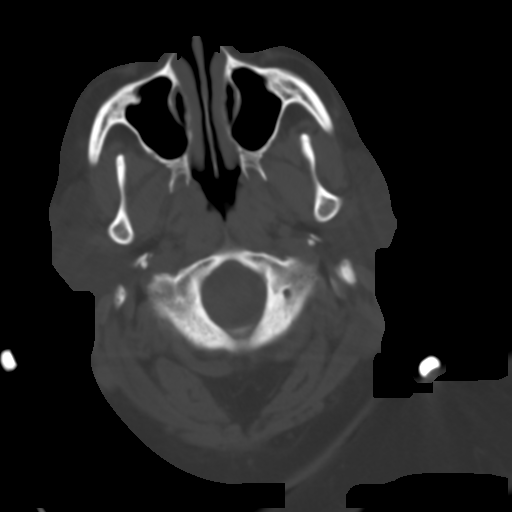
[im 6/35  brain]
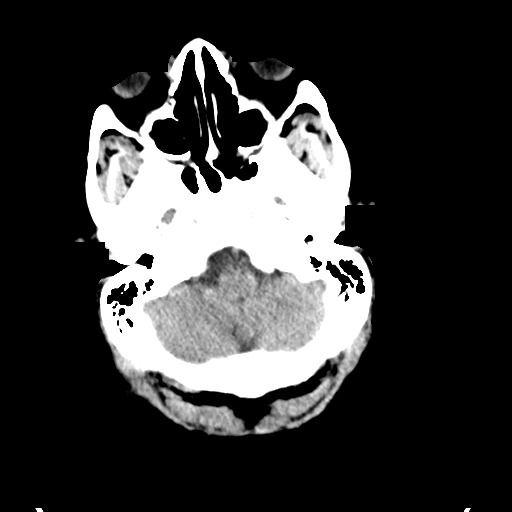
[im 10/35  brain]
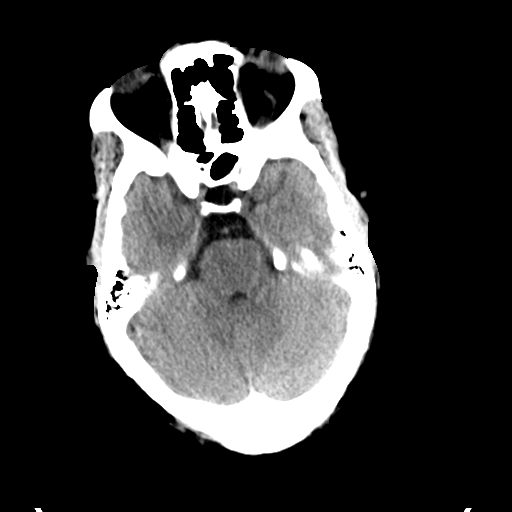
[im 13/35  brain]
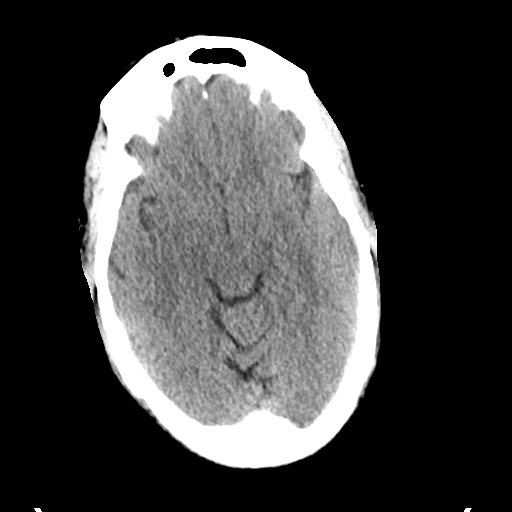
[im 18/35  brain]
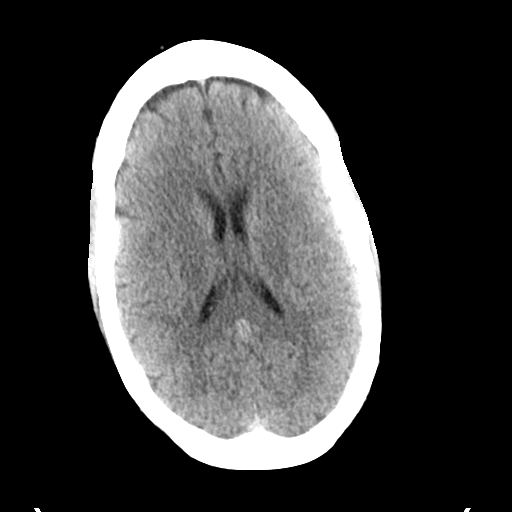
[im 18/35  bone]
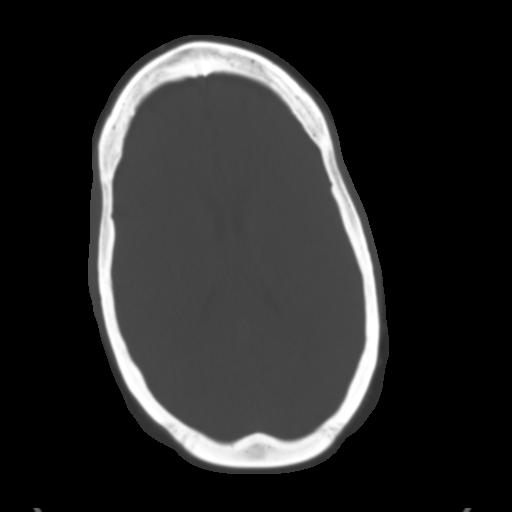
[im 22/35  brain]
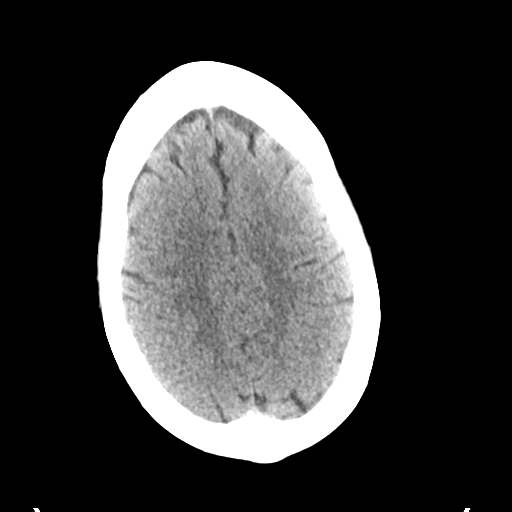
[im 25/35  brain]
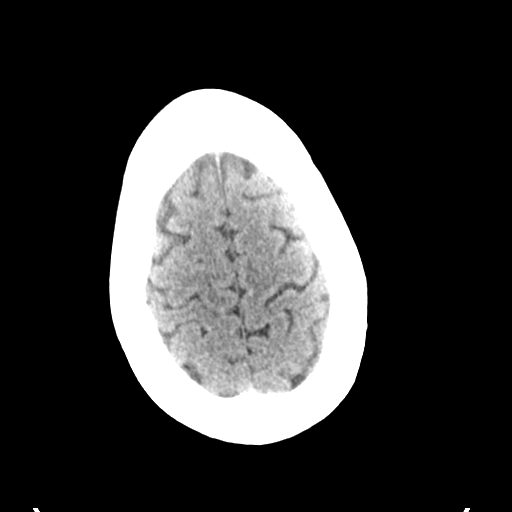
[im 29/35  brain]
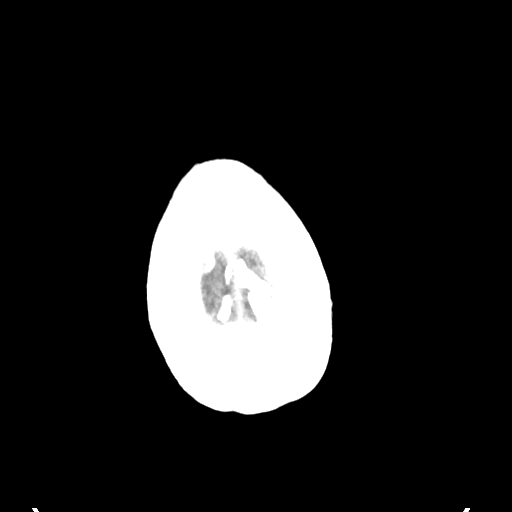
[im 32/35  brain]
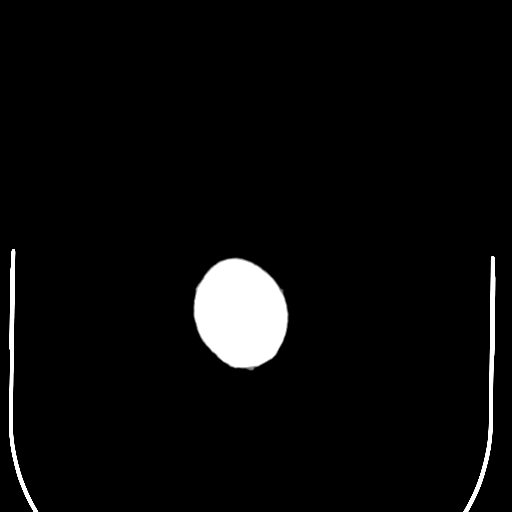
[im 32/35  bone]
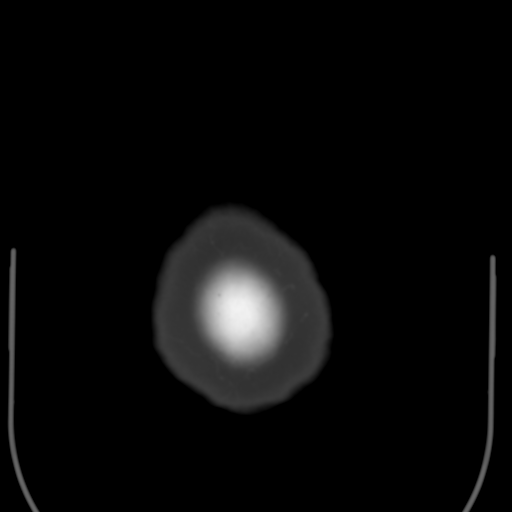

[Series 5: head 3.0 mpr cor · coronal · 0.36mm/px · 3 of 71 slices shown]
[im 24/71  brain]
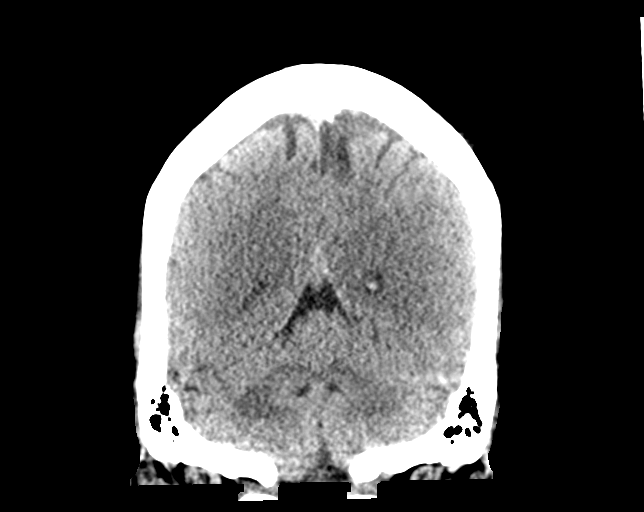
[im 32/71  brain]
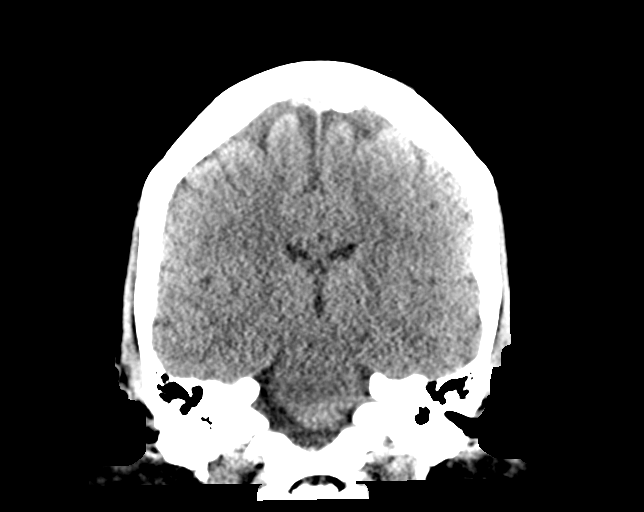
[im 39/71  brain]
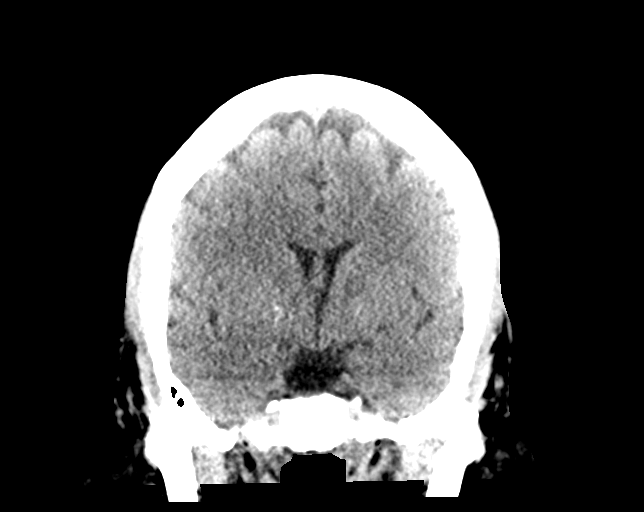

[Series 6: head 3.0 mpr sag · sagittal · 0.39mm/px · 3 of 67 slices shown]
[im 23/67  brain]
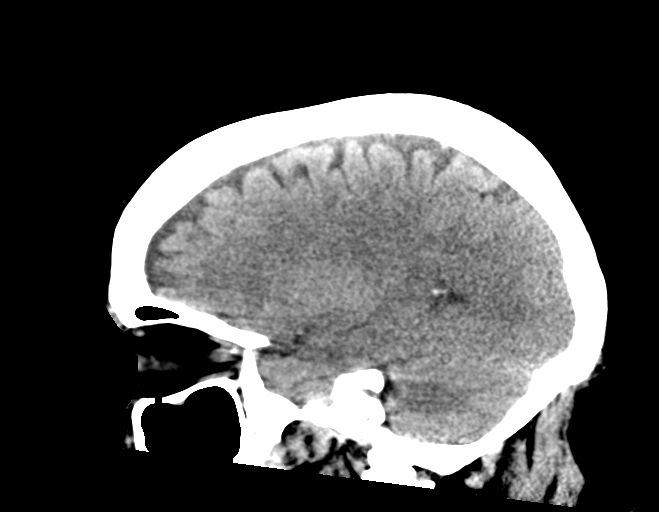
[im 34/67  brain]
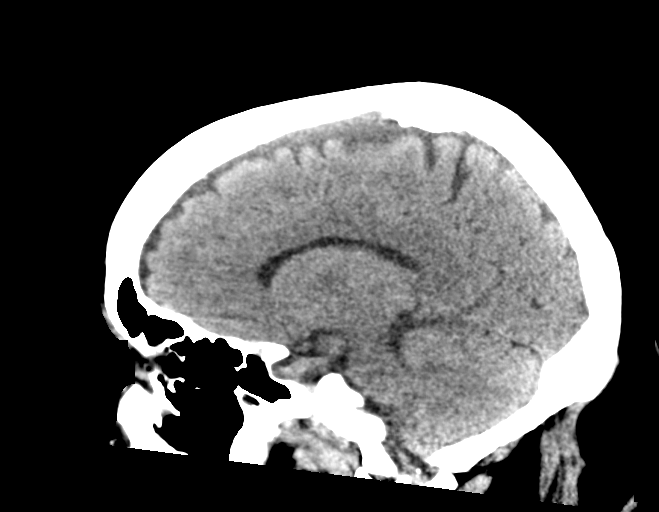
[im 45/67  brain]
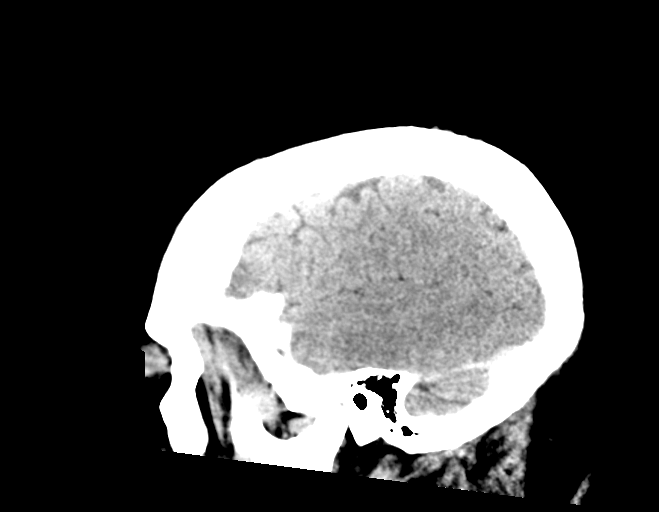

[15 of 47 positions shown; findings below may reference images not displayed]

FINDINGS: Brain: No intracranial hemorrhage, mass effect, or midline shift. No
hydrocephalus. The basilar cisterns are patent. No evidence of
territorial infarct or acute ischemia. No extra-axial or
intracranial fluid collection.

Vascular: No hyperdense vessel or unexpected calcification.

Skull: Normal. Negative for fracture or focal lesion.

Sinuses/Orbits: Paranasal sinuses and mastoid air cells are clear.
The visualized orbits are unremarkable.

Other: None.
IMPRESSION: Unremarkable noncontrast head CT.

## 2018-01-03 IMAGING — DX DG CHEST 2V
2 series · 2 of 2 positions shown · non-contrast
Comparison: 12/10/2016

CLINICAL DATA: Wheezing

EXAM:
CHEST  2 VIEW

[w chest pa]
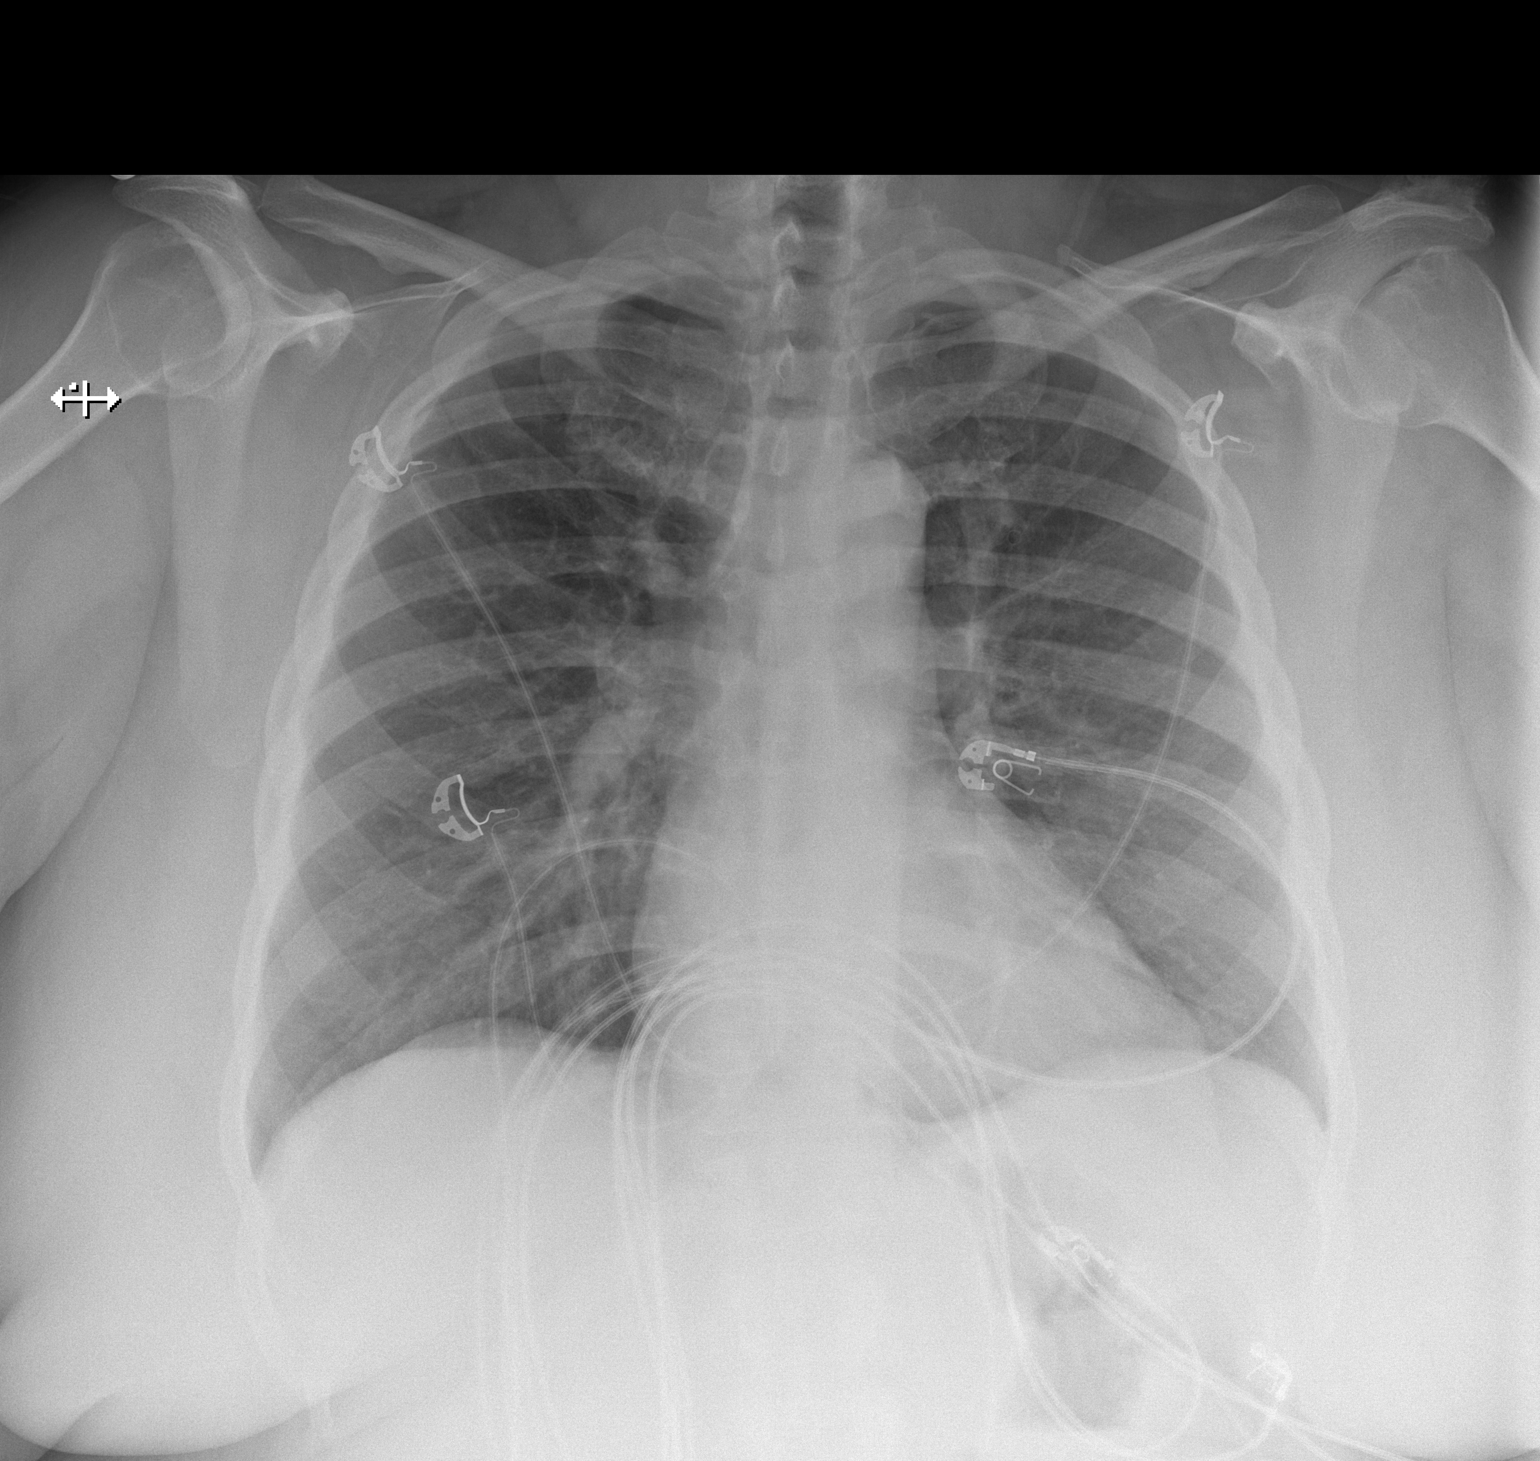

[w chest lat]
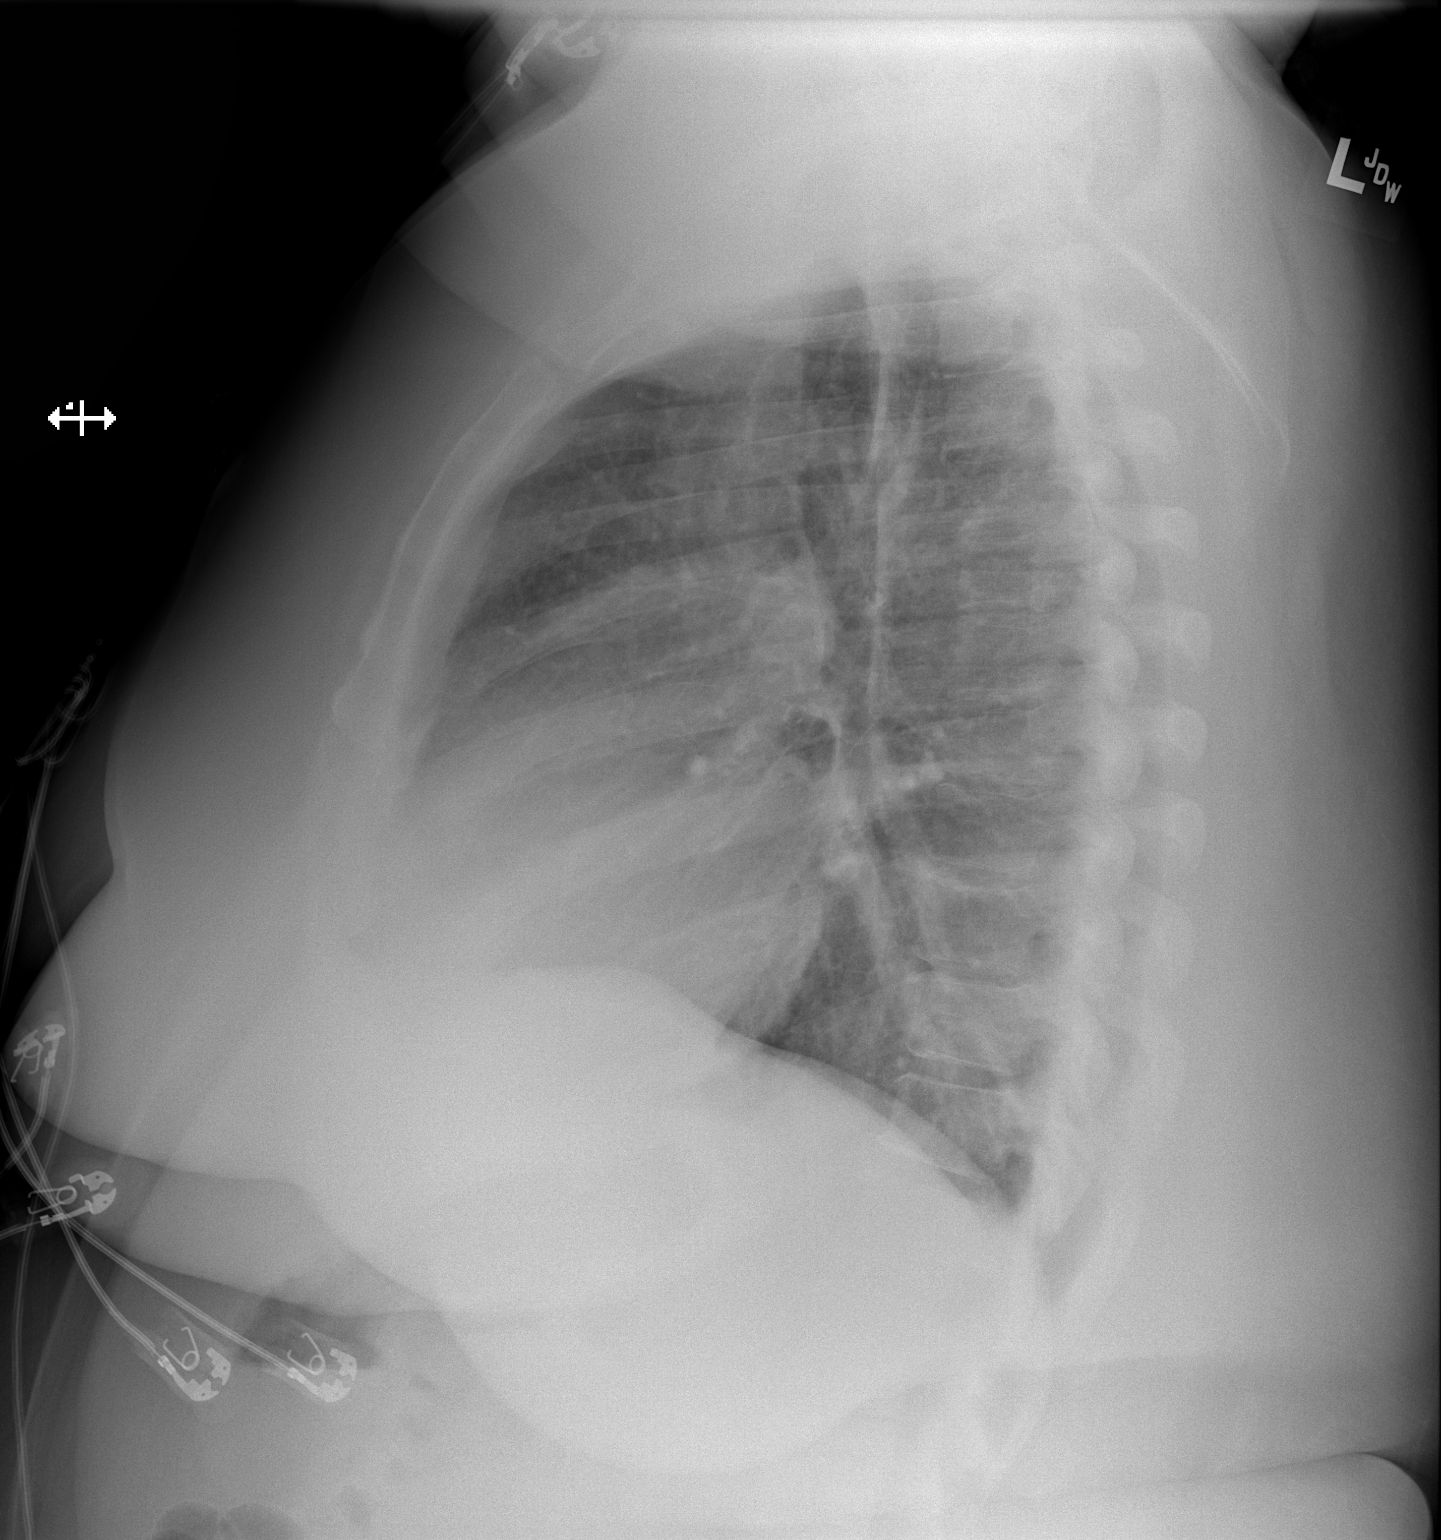

[2 of 2 positions shown; findings below may reference images not displayed]

FINDINGS: The heart size and mediastinal contours are within normal limits.
Mild increase in interstitial prominence and slight peribronchial
thickening seen to better advantage on the lateral view. Findings
likely reflect chronic bronchitic change. No alveolar consolidations
are noted. Subtle ground-glass appearance of the lungs may reflect
an alveolitis or pneumonitis. The visualized skeletal structures are
unremarkable.
IMPRESSION: Stable mild diffuse bronchitic change with faint ground-glass
appearance of the lungs that may reflect an alveolitis/pneumonitis
or possibly some hypoventilatory change of the lungs.

## 2018-01-06 IMAGING — DX DG CHEST 2V
2 series · 2 of 2 positions shown · non-contrast
Comparison: December 18, 2016

CLINICAL DATA: Asthma attack.  Wheezing.

EXAM:
CHEST  2 VIEW

[chest pa]
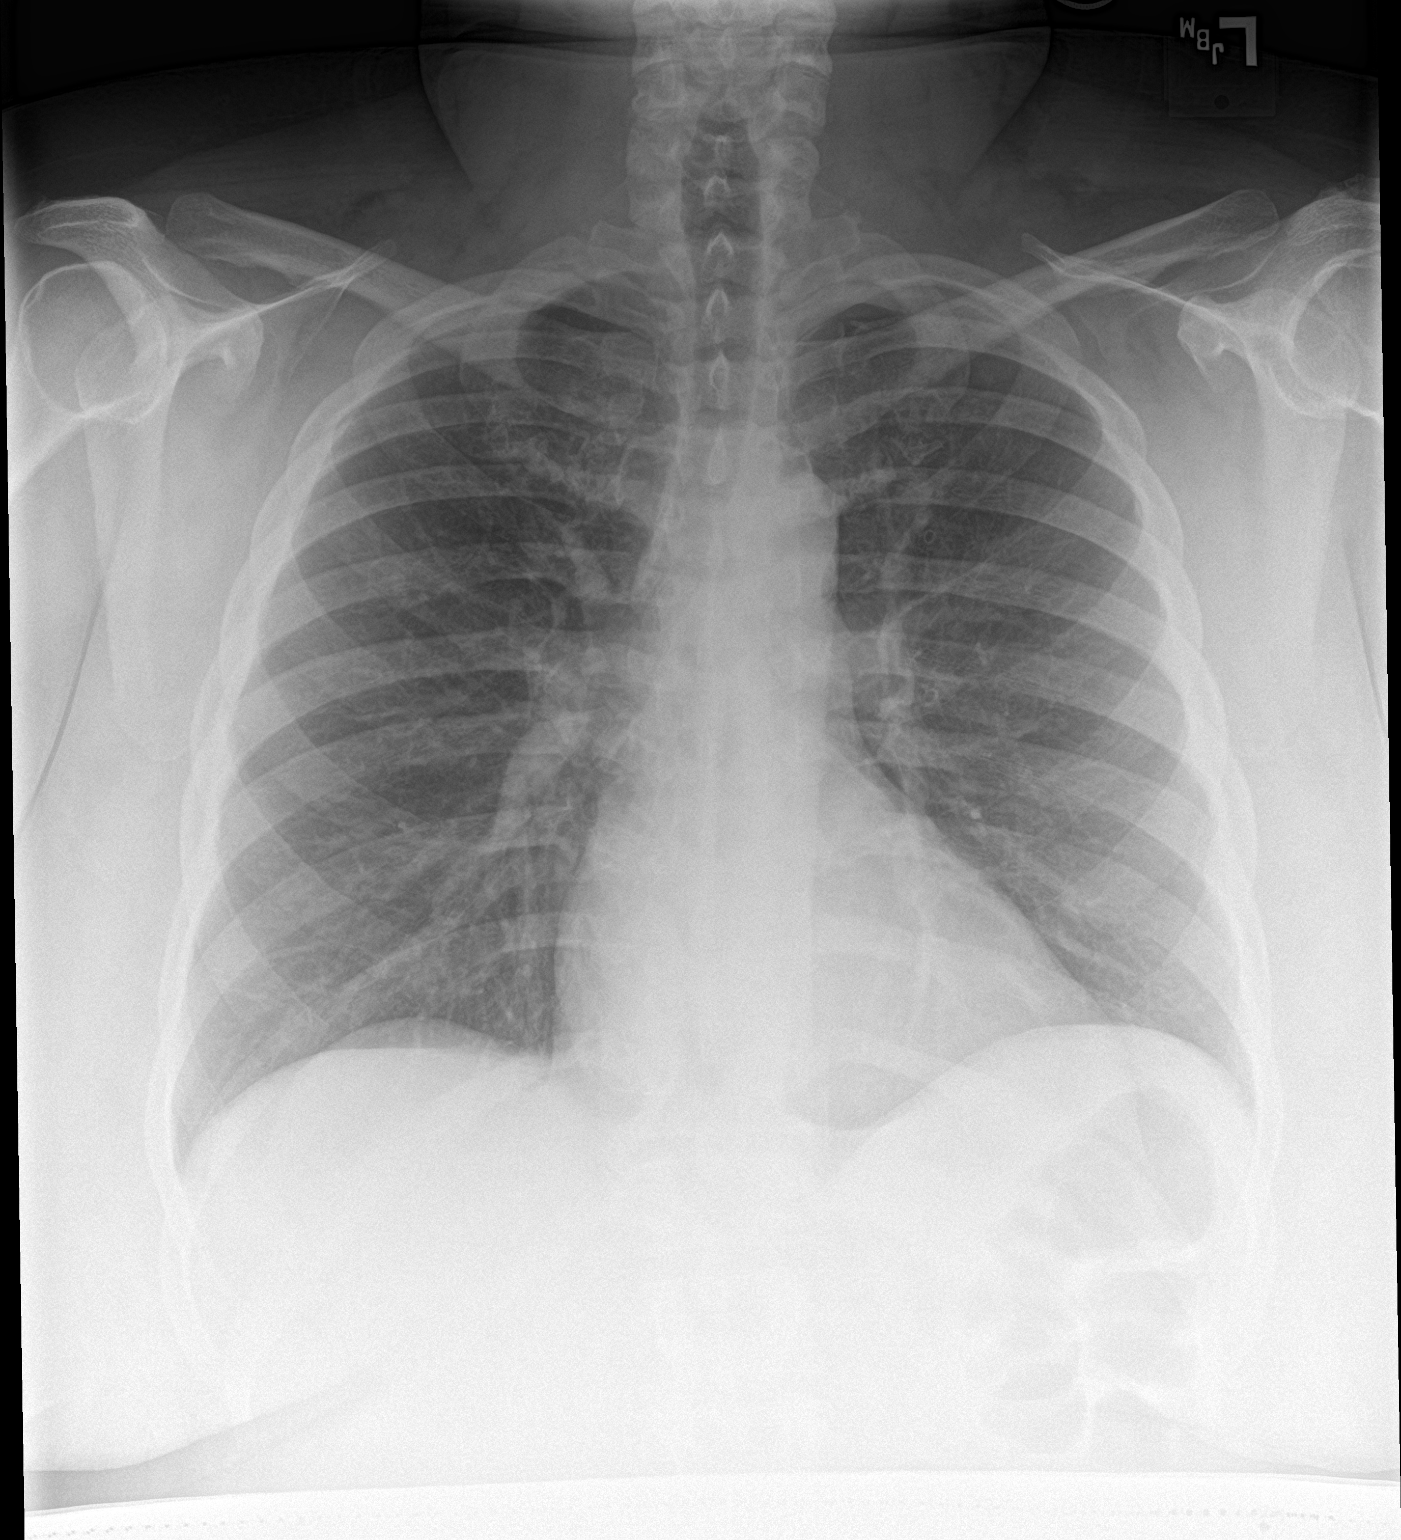

[chest lat]
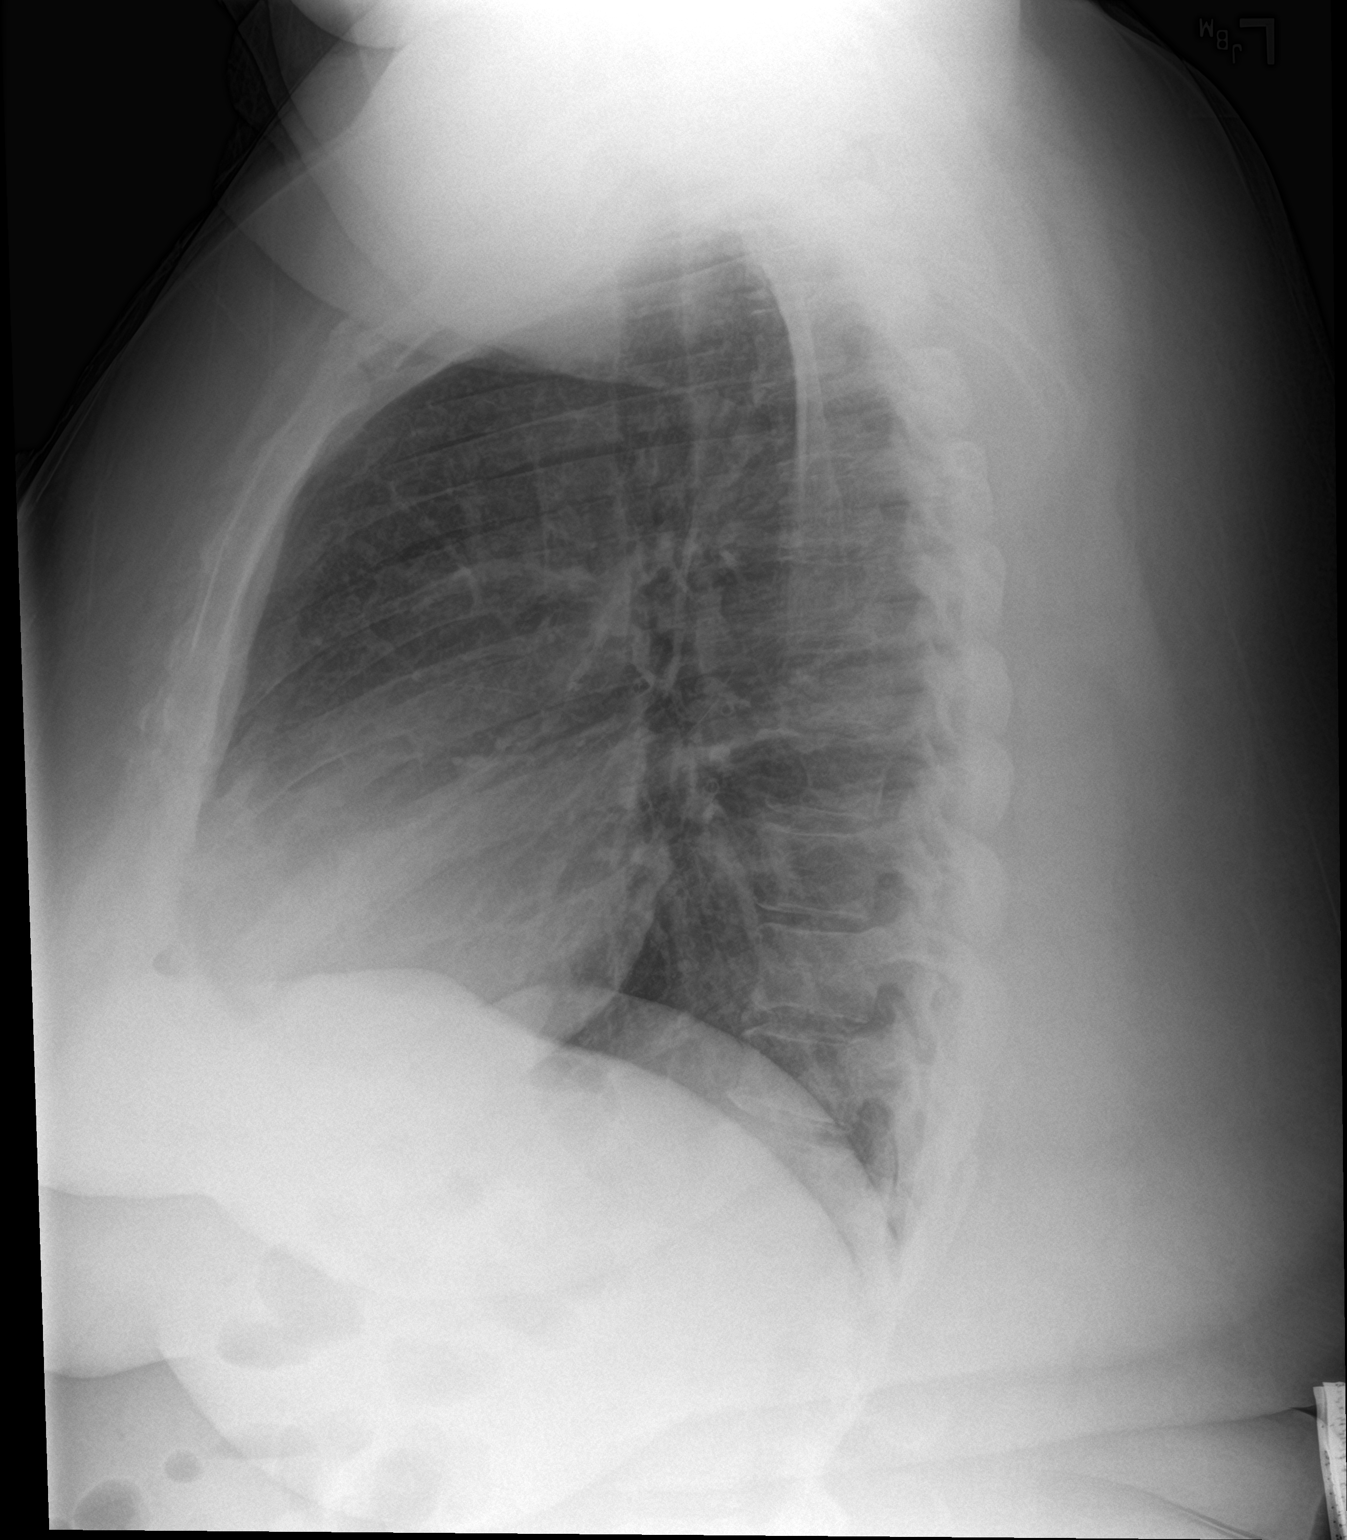

[2 of 2 positions shown; findings below may reference images not displayed]

FINDINGS: The heart size and mediastinal contours are within normal limits.
Both lungs are clear. The visualized skeletal structures are
unremarkable.
IMPRESSION: No active cardiopulmonary disease.

## 2018-01-17 IMAGING — DX DG CHEST 2V
2 series · 2 of 2 positions shown · non-contrast
Comparison: 12/21/2016

CLINICAL DATA: Shortness of breath. Asthma for 3 days. History of
hypertension. Smoker.

EXAM:
CHEST  2 VIEW

[chest pa]
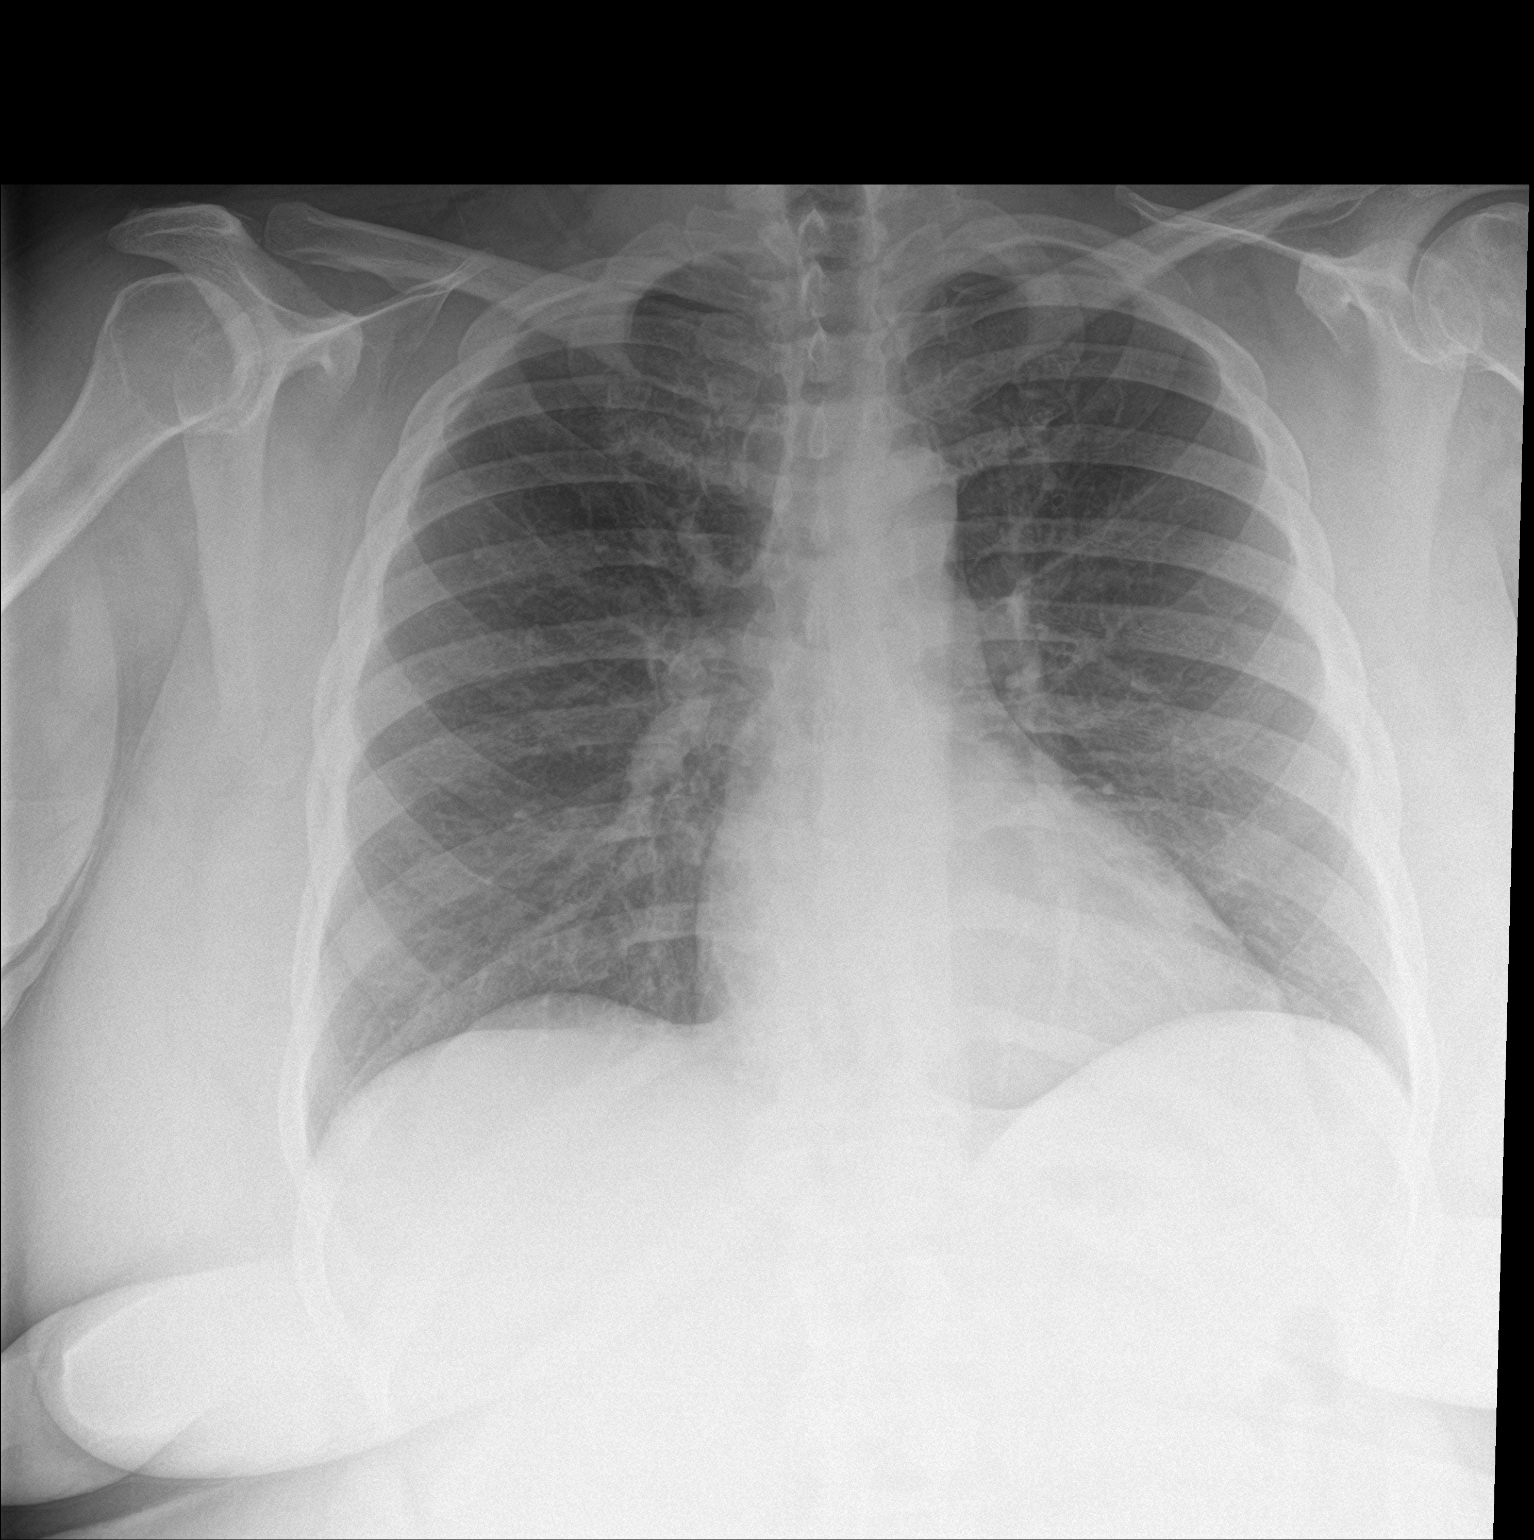

[chest lat]
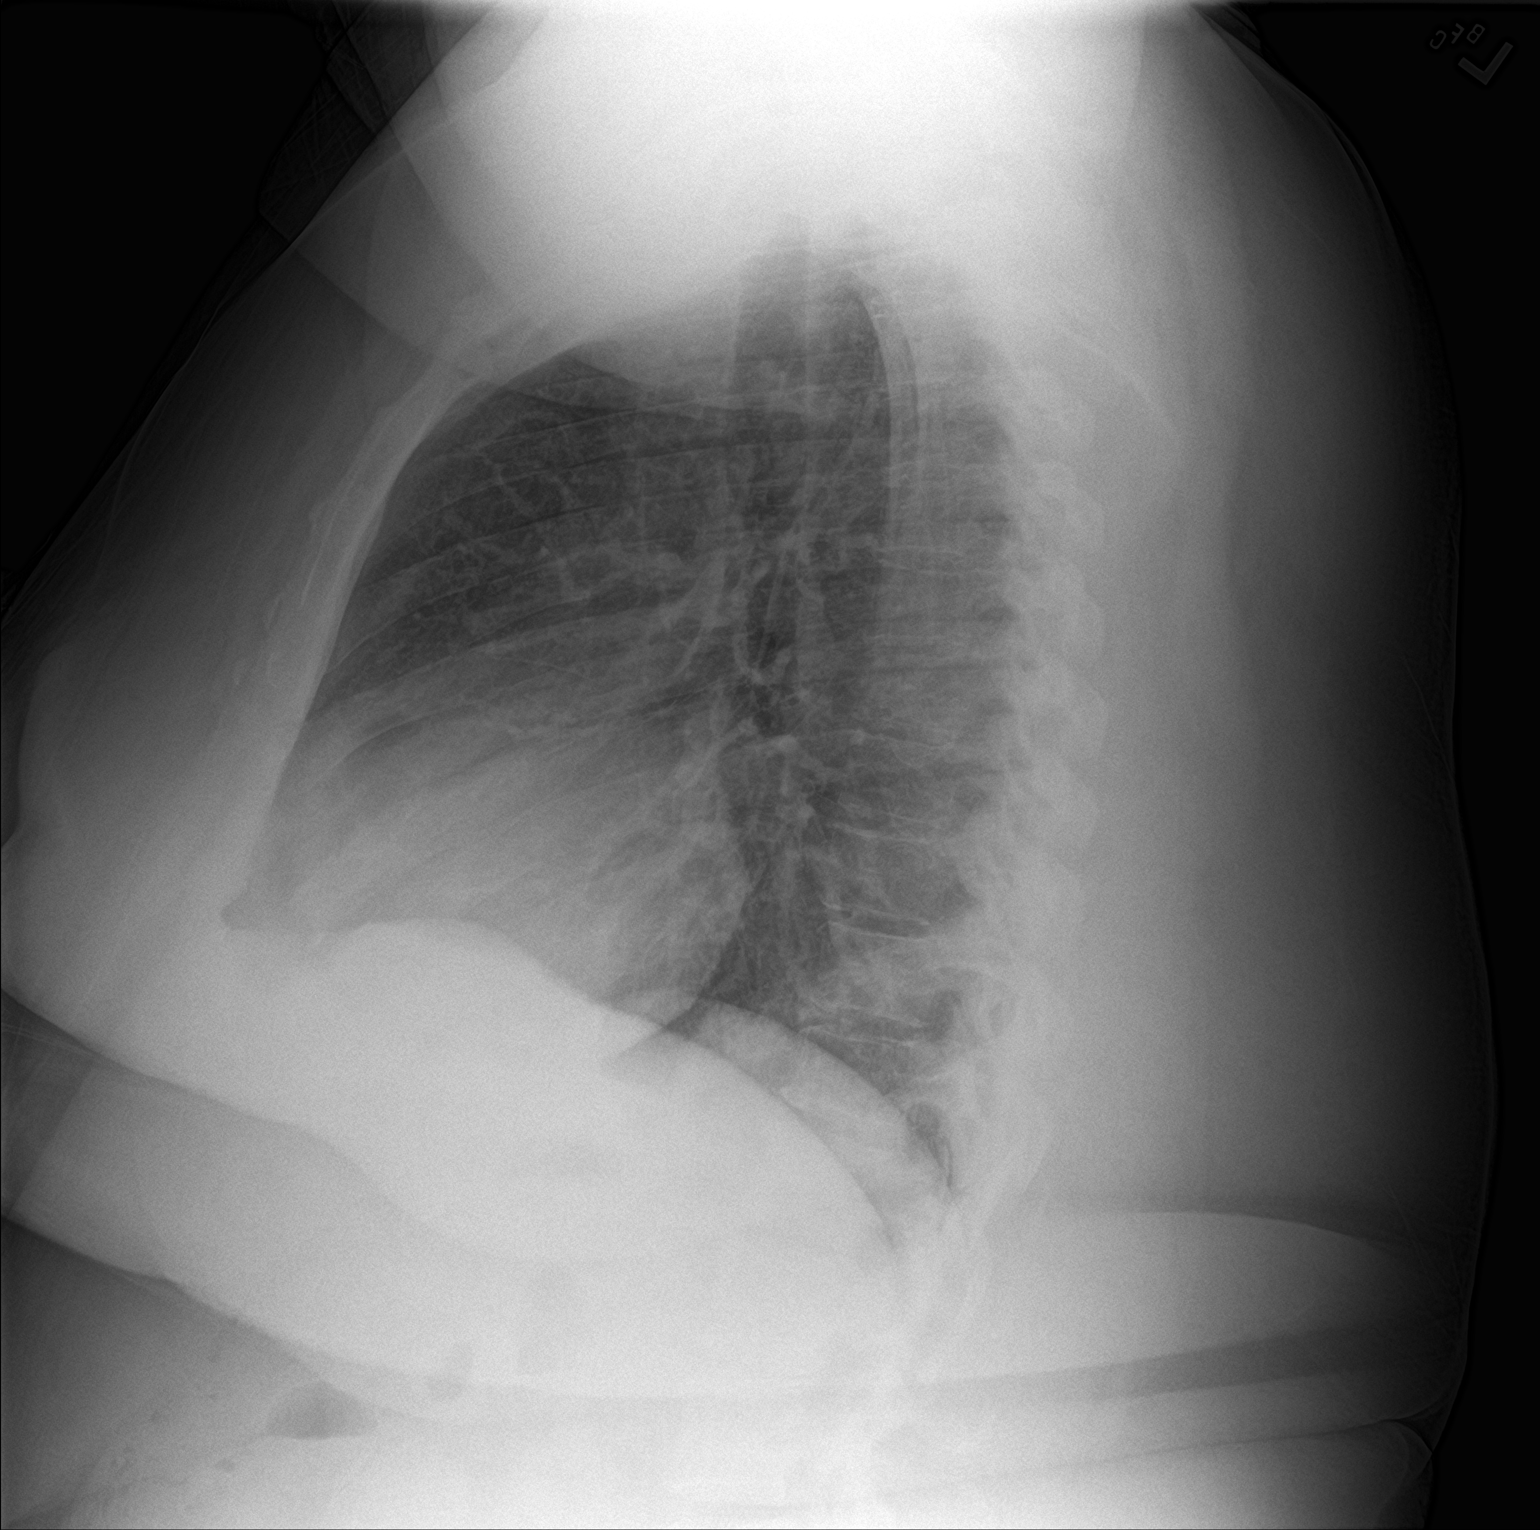

[2 of 2 positions shown; findings below may reference images not displayed]

FINDINGS: Normal heart size and pulmonary vascularity. No focal airspace
disease or consolidation in the lungs. No blunting of costophrenic
angles. No pneumothorax. Mediastinal contours appear intact. Old
left rib fracture.
IMPRESSION: No active cardiopulmonary disease.

## 2018-01-20 ENCOUNTER — Telehealth: Payer: Self-pay

## 2018-01-20 NOTE — Telephone Encounter (Signed)
Call received from patient. She explained that she missed her appointment with pain management and she does not have the number for the office. She asked that this CM call the office and ask them to call her   Call placed to [facility] # [phone], spoke to [facility] and explained that the patient would like a call to reschedule her appointment. Patient's phone # confirmed. Fraser Din stated that she would call the patient to reschedule.

## 2018-01-22 ENCOUNTER — Telehealth: Payer: Self-pay

## 2018-01-22 ENCOUNTER — Ambulatory Visit: Payer: Medicaid Other | Attending: Family Medicine | Admitting: Physician Assistant

## 2018-01-22 VITALS — BP 123/82 | HR 112 | Temp 98.0°F | Resp 18 | Ht 66.0 in | Wt 287.0 lb

## 2018-01-22 DIAGNOSIS — K219 Gastro-esophageal reflux disease without esophagitis: Secondary | ICD-10-CM | POA: Insufficient documentation

## 2018-01-22 DIAGNOSIS — Z6841 Body Mass Index (BMI) 40.0 and over, adult: Secondary | ICD-10-CM | POA: Diagnosis not present

## 2018-01-22 DIAGNOSIS — M79672 Pain in left foot: Secondary | ICD-10-CM | POA: Insufficient documentation

## 2018-01-22 DIAGNOSIS — R202 Paresthesia of skin: Secondary | ICD-10-CM | POA: Diagnosis not present

## 2018-01-22 DIAGNOSIS — I5032 Chronic diastolic (congestive) heart failure: Secondary | ICD-10-CM | POA: Diagnosis not present

## 2018-01-22 DIAGNOSIS — I110 Hypertensive heart disease with heart failure: Secondary | ICD-10-CM | POA: Insufficient documentation

## 2018-01-22 DIAGNOSIS — F419 Anxiety disorder, unspecified: Secondary | ICD-10-CM | POA: Insufficient documentation

## 2018-01-22 DIAGNOSIS — Z79899 Other long term (current) drug therapy: Secondary | ICD-10-CM | POA: Insufficient documentation

## 2018-01-22 DIAGNOSIS — M79671 Pain in right foot: Secondary | ICD-10-CM | POA: Diagnosis not present

## 2018-01-22 DIAGNOSIS — G4733 Obstructive sleep apnea (adult) (pediatric): Secondary | ICD-10-CM | POA: Diagnosis not present

## 2018-01-22 DIAGNOSIS — I10 Essential (primary) hypertension: Secondary | ICD-10-CM | POA: Diagnosis not present

## 2018-01-22 DIAGNOSIS — Z7901 Long term (current) use of anticoagulants: Secondary | ICD-10-CM | POA: Diagnosis not present

## 2018-01-22 DIAGNOSIS — J449 Chronic obstructive pulmonary disease, unspecified: Secondary | ICD-10-CM | POA: Diagnosis not present

## 2018-01-22 MED ORDER — GABAPENTIN 300 MG PO CAPS: 300.0000 mg | ORAL_CAPSULE | Freq: Every day | ORAL | 3 refills | Status: DC

## 2018-01-22 NOTE — Telephone Encounter (Signed)
Met with the patient when she was in the clinic today.  She explained that she has been approved for disability - $1200/month.  She also noted that she has received back pay.  She said that she has not yet been able to schedule her appointment at [facility].  Carleene Mains, [facility] scheduler contacted [place] and scheduled an appointment for her on 02/03/18.  She was provided this appointment information.

## 2018-01-22 NOTE — Progress Notes (Signed)
Patient ID: April [name], female   DOB: 1970/10/18, 47 y.o.   MRN: [id number]   April [name], is a 47 y.o. female  MPN:[id number]  MGQ:[id number]  DOB - 1971-02-03  Subjective:  Chief Complaint and HPI: April [name] is a 47 y.o. female here today for B foot pain and paresthesias that are worse at night.   This has been going on for about 1 week.  No h/o DM or chemotherapy.  No h/o gastric bypass.  NKI.  No recent illness.  No f/c.  No rash.   ROS:   Constitutional:  No f/c, No night sweats, No unexplained weight loss. EENT:  No vision changes, No blurry vision, No hearing changes. No mouth, throat, or ear problems.  Respiratory: No cough, No SOB Cardiac: No CP, no palpitations GI:  No abd pain, No N/V/D. GU: No Urinary s/sx Musculoskeletal: B feet pain Neuro: No headache, no dizziness, no motor weakness.  Skin: No rash Endocrine:  No polydipsia. No polyuria.  Psych: Denies SI/HI  No problems updated.  ALLERGIES: Allergies  Allergen Reactions  . Contrast Media [Iodinated Diagnostic Agents] Itching    Ct contrast  . Oxycodone-Acetaminophen Itching    PAST MEDICAL HISTORY: Past Medical History:  Diagnosis Date  . Acanthosis nigricans   . Anxiety   . Arthritis    "knees" (04/28/2017)  . Asthma    Followed by Dr. Annamaria Boots (pulmonology); receives every other week omalizumab injections; has frequent exacerbations  . Chronic diastolic CHF (congestive heart failure) ([place]) 01/17/2017  . COPD (chronic obstructive pulmonary disease) ([place])    PFTs in 2002, FEV1/FVC 65, no post bronchodilater test done  . Depression   . GERD (gastroesophageal reflux disease)   . Headache(784.0)    "q couple days" (04/28/2017)  . Helicobacter pylori (H. pylori) infection   . Hypertension, essential   . Insomnia   . Menorrhagia   . Morbid obesity ([place])   . OSA on CPAP    Sleep study 2008 - mild OSA, not enough events to titrate CPAP; wears CPAP now/pt on 04/28/2017  . Pneumonia X 1  . Seasonal  allergies   . Shortness of breath   . Tobacco user     MEDICATIONS AT HOME: Prior to Admission medications   Medication Sig Start Date End Date Taking? Authorizing Provider  acetaminophen (TYLENOL) 325 MG tablet Take 650 mg by mouth every 6 (six) hours as needed for mild pain. 11/09/17   [provider]  apixaban (ELIQUIS) 5 MG TABS tablet Take 5 mg by mouth 2 (two) times daily. 10/02/17   [provider]  cetirizine (ZYRTEC) 10 MG tablet Take 10 mg by mouth daily. 10/21/17   [provider]  fluticasone (FLONASE) 50 MCG/ACT nasal spray Place 1 spray into both nostrils daily. 10/16/17   Sheikh, Omair Latif, DO  Fluticasone-Salmeterol (ADVAIR) 250-50 MCG/DOSE AEPB Inhale 1 puff into the lungs 2 (two) times daily. 10/16/17   [provider]  furosemide (LASIX) 40 MG tablet Take 40 mg by mouth daily. 10/02/17 10/02/18  [provider]  gabapentin (NEURONTIN) 300 MG capsule Take 1 capsule (300 mg total) by mouth at bedtime. 01/22/18   Argentina Donovan, PA-C  guaiFENesin (MUCINEX) 600 MG 12 hr tablet Take 2 tablets (1,200 mg total) by mouth 2 (two) times daily. 10/15/17   Sheikh, Omair Latif, DO  ipratropium-albuterol (DUONEB) 0.5-2.5 (3) MG/3ML SOLN Inhale 3 ml two times daily and every 2 hours as needed for shortness of breath or wheezing  [provider]  magnesium oxide (MAG-OX) 400 MG tablet Take 400 mg by mouth 3 (three) times daily.  10/27/17   [provider]  Melatonin 3 MG TABS Take 1 tablet by mouth at bedtime. 10/20/17   [provider]  menthol-cetylpyridinium (CEPACOL) 3 MG lozenge Take 1 lozenge by mouth every 4 (four) hours as needed for sore throat.    [provider]  montelukast (SINGULAIR) 10 MG tablet Take 10 mg by mouth at bedtime. 10/02/17   [provider]  Multiple Vitamin (MULTIVITAMIN WITH MINERALS) TABS tablet Take 1 tablet by mouth daily. 10/16/17   Sheikh, Omair Latif, DO  ondansetron (ZOFRAN) 4  MG tablet Take 4 mg by mouth 3 (three) times daily before meals.  11/07/17   [provider]  pantoprazole (PROTONIX) 40 MG tablet Take 1 tablet (40 mg total) by mouth daily. 12/24/17 12/24/18  [name], MD  Potassium Chloride ER 20 MEQ TBCR Take 20 mEq by mouth 2 (two) times daily.  10/27/17   [provider]  SALINE NASAL SPRAY NA Place 1 spray into both nostrils every 2 (two) hours as needed. 10/15/17   [provider]  sertraline (ZOLOFT) 100 MG tablet Take 100 mg by mouth daily. 10/02/17   [provider]  zolpidem (AMBIEN) 5 MG tablet Take 1 tablet (5 mg total) by mouth at bedtime. 12/18/17   [name], MD     Objective:  EXAM:   Vitals:   01/22/18 1351  BP: 123/82  Pulse: (!) 112  Resp: 18  Temp: 98 F (36.7 C)  TempSrc: Oral  SpO2: 98%  Weight: 287 lb (130.2 kg)  Height: 5\' 6"  (1.676 m)    General appearance : A&OX3. NAD. Non-toxic-appearing HEENT: Atraumatic and Normocephalic.  PERRLA. EOM intact.  Neck: supple, no JVD. No cervical lymphadenopathy. No thyromegaly Chest/Lungs:  Breathing-non-labored, Good air entry bilaterally, breath sounds normal without rales, rhonchi, or wheezing  CVS: S1 S2 regular, no murmurs, gallops, rubs  Extremities: Bilateral Lower Ext shows no edema, both legs are warm to touch with = pulse throughout.  B feet N-V intact with normal capillary RF.  No abnormalities of feet noted Neurology:  CN II-XII grossly intact, Non focal.   Psych:  TP linear. J/I WNL. Normal speech. Appropriate eye contact and affect.  Skin:  No Rash  Data Review Lab Results  Component Value Date   HGBA1C 7.2 (H) 07/07/2017   HGBA1C 5.6 04/28/2017   HGBA1C 6.1 (H) 02/04/2017     Assessment & Plan   1. Paresthesias - B12 and Folate Panel - TSH - CBC with Differential/Platelet - Vitamin D, 25-hydroxy - gabapentin (NEURONTIN) 300 MG capsule; Take 1 capsule (300 mg total) by mouth at bedtime.  Dispense: 90 capsule; Refill:  3  2. Essential hypertension Controlled.  Continue current regimen - Basic metabolic panel - CBC with Differential/Platelet  3. Pain in both feet - Vitamin D, 25-hydroxy - gabapentin (NEURONTIN) 300 MG capsule; Take 1 capsule (300 mg total) by mouth at bedtime.  Dispense: 90 capsule; Refill: 3     Patient have been counseled extensively about nutrition and exercise  Return in about 1 month (around 02/21/2018) for Dr Newlin-paresthesias and foot pain.  The patient was given clear instructions to go to ER or return to medical center if symptoms don't improve, worsen or new problems develop. The patient verbalized understanding. The patient was told to call to get lab results if they haven't heard anything in the next  week.     Freeman Caldron, PA-C [facility] [place], [place]   01/22/2018, 1:54 PM

## 2018-01-22 NOTE — Patient Instructions (Signed)
Paresthesia Paresthesia is an abnormal burning or prickling sensation. This sensation is generally felt in the hands, arms, legs, or feet. However, it may occur in any part of the body. Usually, it is not painful. The feeling may be described as:  Tingling or numbness.  Pins and needles.  Skin crawling.  Buzzing.  Limbs falling asleep.  Itching.  Most people experience temporary (transient) paresthesia at some time in their lives. Paresthesia may occur when you breathe too quickly (hyperventilation). It can also occur without any apparent cause. Commonly, paresthesia occurs when pressure is placed on a nerve. The sensation quickly goes away after the pressure is removed. For some people, however, paresthesia is a long-lasting (chronic) condition that is caused by an underlying disorder. If you continue to have paresthesia, you may need further medical evaluation. Follow these instructions at home: Watch your condition for any changes. Taking the following actions may help to lessen any discomfort that you are feeling:  Avoid drinking alcohol.  Try acupuncture or massage to help relieve your symptoms.  Keep all follow-up visits as directed by your health care provider. This is important.  Contact a health care provider if:  You continue to have episodes of paresthesia.  Your burning or prickling feeling gets worse when you walk.  You have pain, cramps, or dizziness.  You develop a rash. Get help right away if:  You feel weak.  You have trouble walking or moving.  You have problems with speech, understanding, or vision.  You feel confused.  You cannot control your bladder or bowel movements.  You have numbness after an injury.  You faint. This information is not intended to replace advice given to you by your health care provider. Make sure you discuss any questions you have with your health care provider. Document Released: 04/05/2002 Document Revised: 09/21/2015  Document Reviewed: 04/11/2014 Elsevier Interactive Patient Education  2018 Elsevier Inc.  

## 2018-01-23 LAB — CBC WITH DIFFERENTIAL/PLATELET
Basophils Absolute: 0.1 10*3/uL (ref 0.0–0.2)
Basos: 1 %
EOS (ABSOLUTE): 0.3 10*3/uL (ref 0.0–0.4)
EOS: 2 %
HEMATOCRIT: 33.3 % — AB (ref 34.0–46.6)
HEMOGLOBIN: 10.4 g/dL — AB (ref 11.1–15.9)
Immature Grans (Abs): 0 10*3/uL (ref 0.0–0.1)
Immature Granulocytes: 0 %
LYMPHS ABS: 3.3 10*3/uL — AB (ref 0.7–3.1)
Lymphs: 26 %
MCH: 25.7 pg — ABNORMAL LOW (ref 26.6–33.0)
MCHC: 31.2 g/dL — AB (ref 31.5–35.7)
MCV: 82 fL (ref 79–97)
Monocytes Absolute: 1 10*3/uL — ABNORMAL HIGH (ref 0.1–0.9)
Monocytes: 8 %
NEUTROS ABS: 8.2 10*3/uL — AB (ref 1.4–7.0)
Neutrophils: 63 %
Platelets: 616 10*3/uL — ABNORMAL HIGH (ref 150–450)
RBC: 4.04 x10E6/uL (ref 3.77–5.28)
RDW: 17.1 % — ABNORMAL HIGH (ref 12.3–15.4)
WBC: 12.9 10*3/uL — ABNORMAL HIGH (ref 3.4–10.8)

## 2018-01-23 LAB — BASIC METABOLIC PANEL
BUN / CREAT RATIO: 7 — AB (ref 9–23)
BUN: 4 mg/dL — ABNORMAL LOW (ref 6–24)
CHLORIDE: 103 mmol/L (ref 96–106)
CO2: 19 mmol/L — ABNORMAL LOW (ref 20–29)
Calcium: 8.4 mg/dL — ABNORMAL LOW (ref 8.7–10.2)
Creatinine, Ser: 0.54 mg/dL — ABNORMAL LOW (ref 0.57–1.00)
GFR calc non Af Amer: 114 mL/min/{1.73_m2} (ref >59)
GFR, EST AFRICAN AMERICAN: 132 mL/min/{1.73_m2} (ref >59)
Glucose: 97 mg/dL (ref 65–99)
POTASSIUM: 3.3 mmol/L — AB (ref 3.5–5.2)
SODIUM: 141 mmol/L (ref 134–144)

## 2018-01-23 LAB — TSH: TSH: 3.09 u[IU]/mL (ref 0.450–4.500)

## 2018-01-23 LAB — B12 AND FOLATE PANEL
Folate: 2.8 ng/mL — ABNORMAL LOW (ref >3.0)
Vitamin B-12: 345 pg/mL (ref 232–1245)

## 2018-01-23 LAB — VITAMIN D 25 HYDROXY (VIT D DEFICIENCY, FRACTURES): Vit D, 25-Hydroxy: 13.8 ng/mL — ABNORMAL LOW (ref 30.0–100.0)

## 2018-01-26 ENCOUNTER — Other Ambulatory Visit: Payer: Self-pay | Admitting: Physician Assistant

## 2018-01-26 ENCOUNTER — Telehealth: Payer: Self-pay | Admitting: Family Medicine

## 2018-01-26 ENCOUNTER — Telehealth: Payer: Self-pay | Admitting: *Deleted

## 2018-01-26 MED ORDER — POTASSIUM CHLORIDE CRYS ER 20 MEQ PO TBCR: 20.0000 meq | EXTENDED_RELEASE_TABLET | Freq: Every day | ORAL | 0 refills | Status: DC

## 2018-01-26 MED ORDER — IRON 325 (65 FE) MG PO TABS: 1.0000 | ORAL_TABLET | Freq: Every morning | ORAL | 0 refills | Status: DC

## 2018-01-26 MED ORDER — FOLIC ACID 800 MCG PO TABS: 400.0000 ug | ORAL_TABLET | Freq: Every day | ORAL | 1 refills | Status: DC

## 2018-01-26 NOTE — Telephone Encounter (Signed)
Medical Assistant left message on patient's home and cell voicemail Voicemail states to give a call back to [place] with [facility] at [phone]. !!!Please inform patient of folic acid, iron and potassium being low. Patient should increase water intake and pick up supplements from [facility] pharmacy to address kidney impairment. Patient should follow up in 3 weeks. All of these could contribute to her feet concerns!!!

## 2018-01-26 NOTE — Telephone Encounter (Signed)
Pt called to request her lab results,verified DOB and received note left by her nurse. Had no further questions or concerns and agreed to begin taking medications sent to her pharmacy.

## 2018-01-26 NOTE — Telephone Encounter (Signed)
-----   Message from [place] Donovan, [place] sent at 01/26/2018  7:34 AM EDT ----- Please call pateint.  Her folic acid, iron, and potassium are all low.  I sent prescriptions to our pharmacy for this.  Increase water intake bc kidney function is impaired.  All of this things may be contributing to the problems with her feet.  Follow-up with PCP/make appt for 3 weeks.  Thanks, Freeman Caldron, PA-C

## 2018-02-02 IMAGING — CR DG CHEST 2V
2 series · 2 of 2 positions shown · non-contrast
Comparison: 01/01/2017

CLINICAL DATA: Shortness of breath for 2 days

EXAM:
CHEST  2 VIEW

[chest pa]
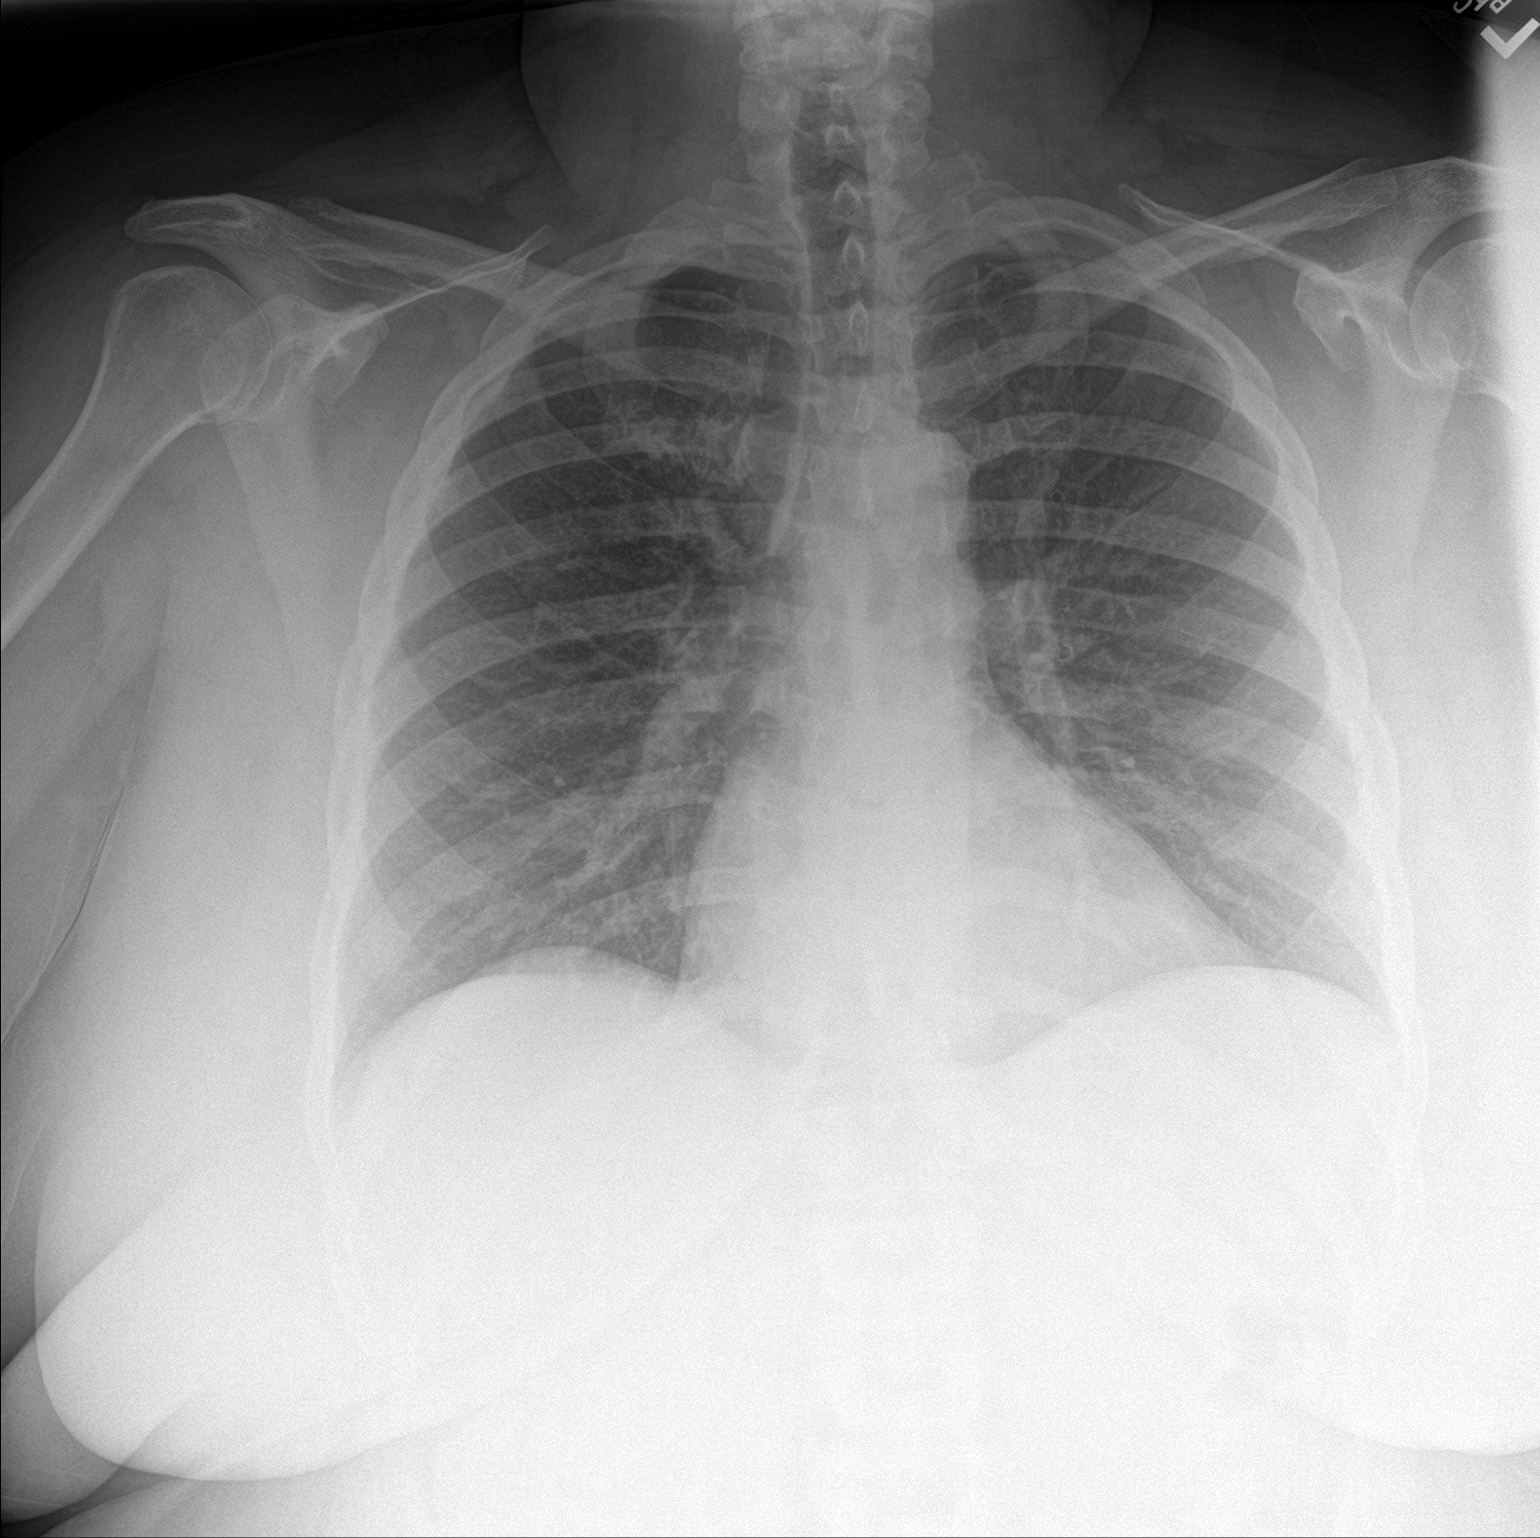

[chest lat]
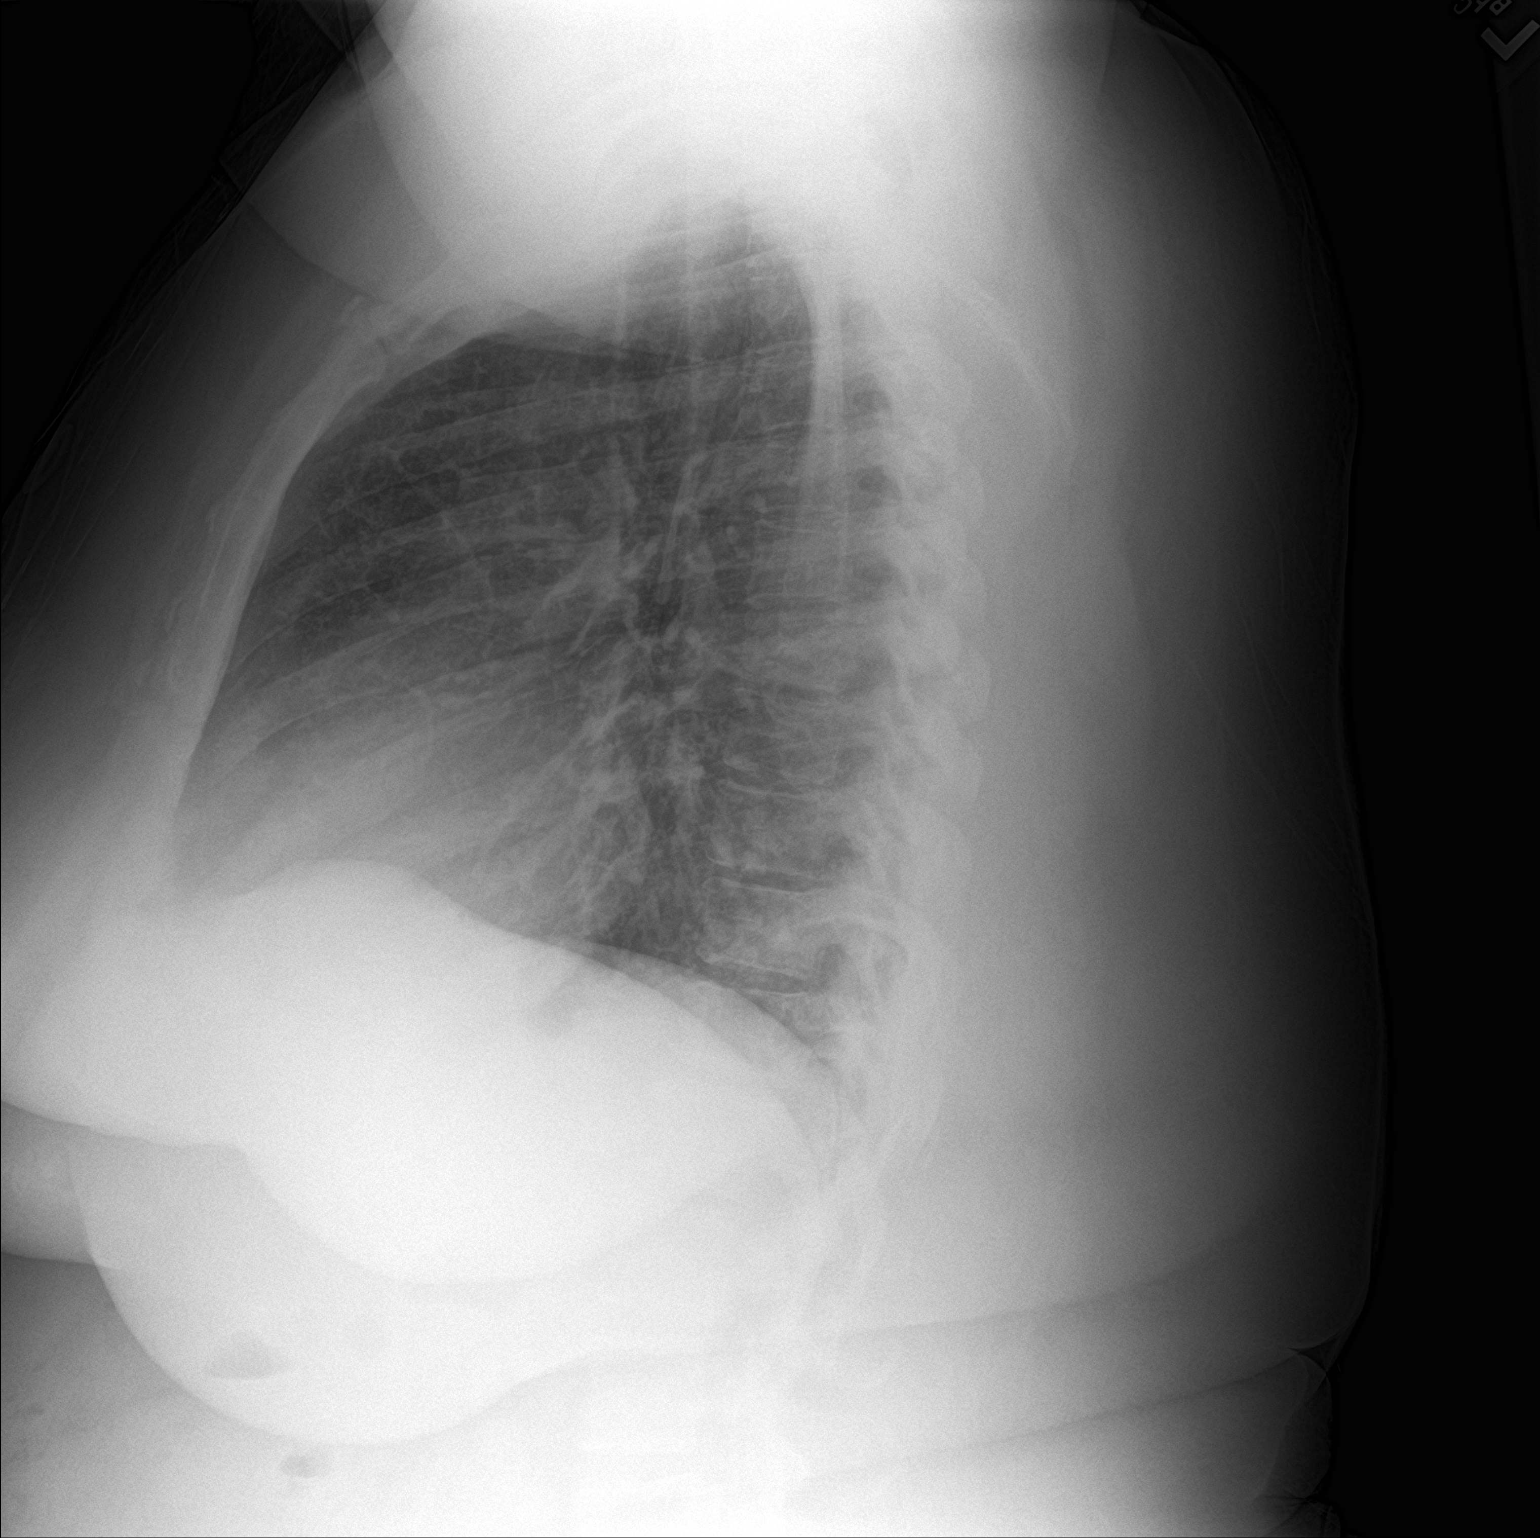

[2 of 2 positions shown; findings below may reference images not displayed]

FINDINGS: The heart size and mediastinal contours are within normal limits.
Both lungs are clear. The visualized skeletal structures are
unremarkable.
IMPRESSION: No active cardiopulmonary disease.

## 2018-02-03 ENCOUNTER — Telehealth: Payer: Self-pay | Admitting: Family Medicine

## 2018-02-12 ENCOUNTER — Ambulatory Visit: Payer: Medicaid Other | Admitting: Internal Medicine

## 2018-02-14 IMAGING — DX DG CHEST 1V PORT
1 series · 1 of 1 positions shown · non-contrast
Comparison: Chest x-ray dated January 26, 2017.

CLINICAL DATA: Shortness of breath.

EXAM:
PORTABLE CHEST 1 VIEW

[chest ap]
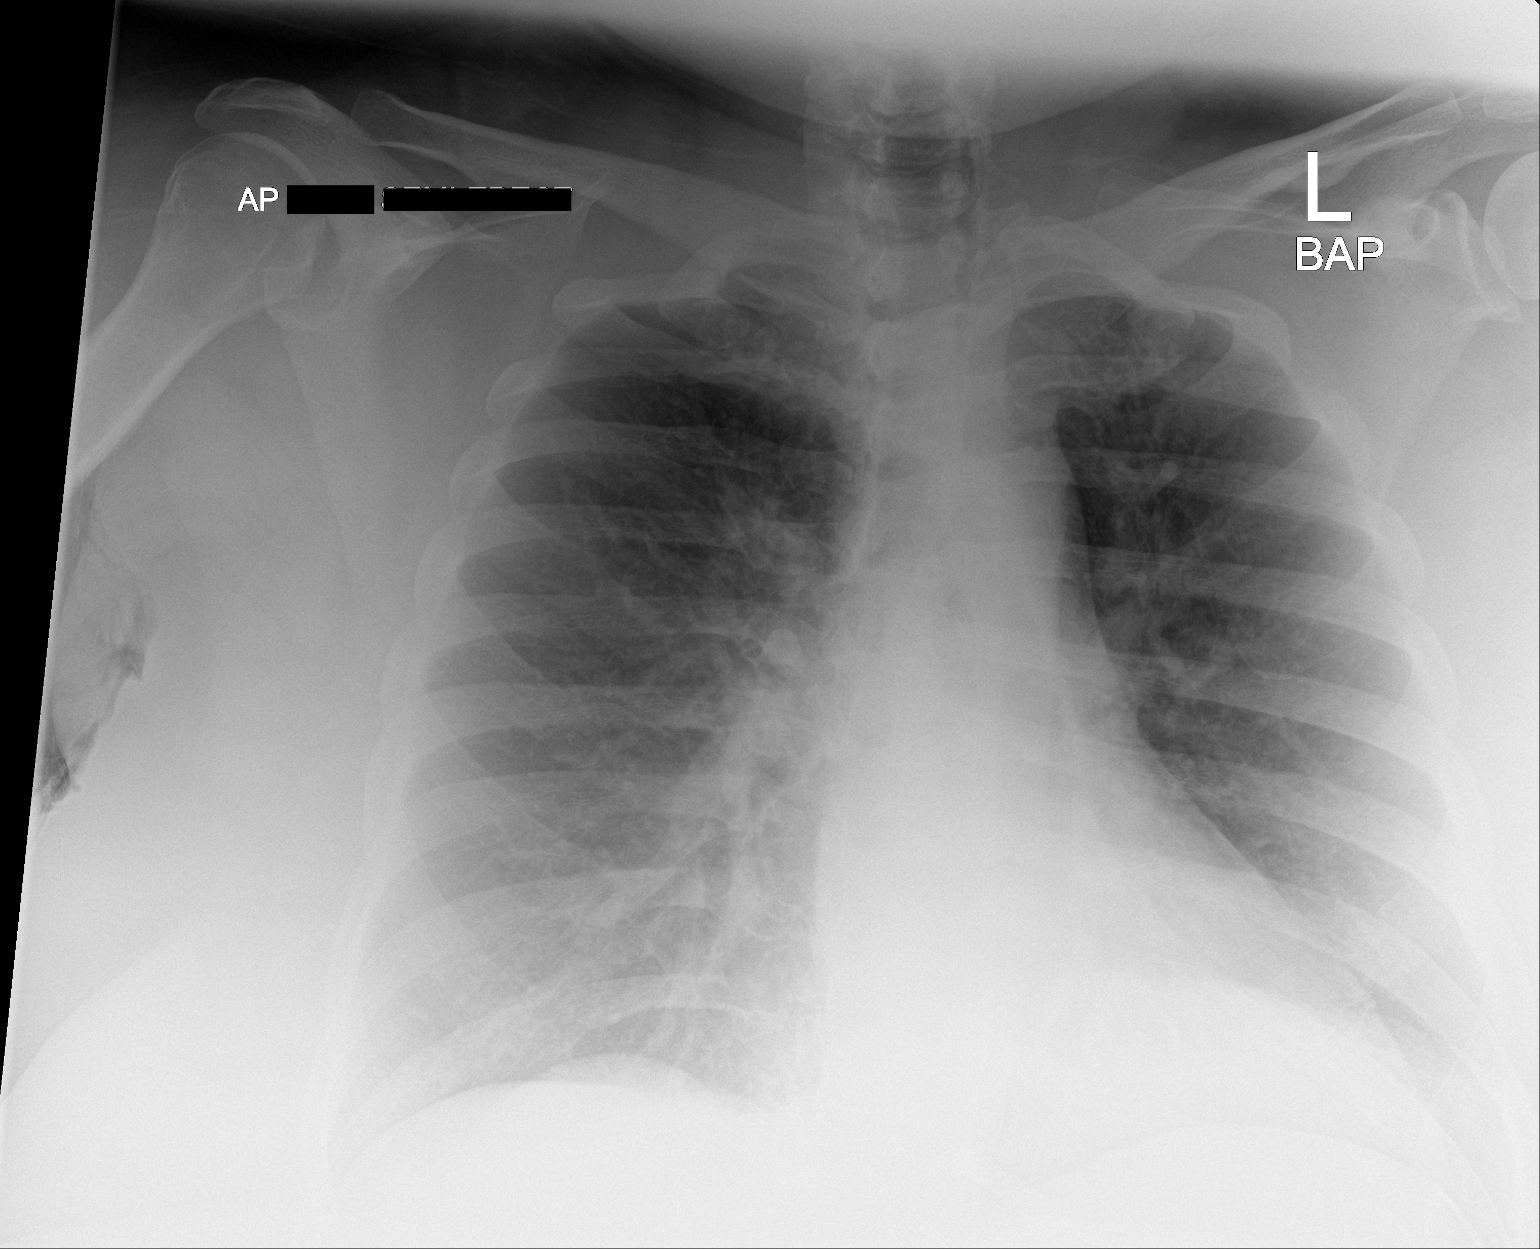

[1 of 1 positions shown; findings below may reference images not displayed]

FINDINGS: The cardiomediastinal silhouette is normal in size given technique.
Normal pulmonary vascularity. No focal consolidation, pleural
effusion, or pneumothorax. No acute osseous abnormality.
IMPRESSION: No active disease.

## 2018-02-16 ENCOUNTER — Telehealth: Payer: Self-pay | Admitting: Family Medicine

## 2018-02-16 NOTE — Telephone Encounter (Signed)
Patient called to speak with you. Please follow up with patient.

## 2018-02-16 NOTE — Telephone Encounter (Signed)
Call placed to the patient.  She said that she has been trying to have prescriptions transferred from [facility] pharmacy to [facility] for about 2 weeks and the medications have not been transferred yet. Informed her that the [facility] pharmacy is now closed. This CM will need to check the status tomorrow and contact her.

## 2018-02-17 ENCOUNTER — Telehealth: Payer: Self-pay

## 2018-02-17 NOTE — Telephone Encounter (Signed)
Attempted to contact the patient to inform her that as per Anette Riedel, [place], her prescriptions for potassium and iron were sent to [facility] yesterday afternoon. Call placed to # [phone] and a message was left requesting a call back to this CM # [phone].

## 2018-02-19 ENCOUNTER — Other Ambulatory Visit: Payer: Self-pay | Admitting: Internal Medicine

## 2018-02-20 IMAGING — CR DG CHEST 2V
2 series · 2 of 2 positions shown · non-contrast
Comparison: 01/29/2017

CLINICAL DATA: Dyspnea.  History of asthma.

EXAM:
CHEST  2 VIEW

[chest pa]
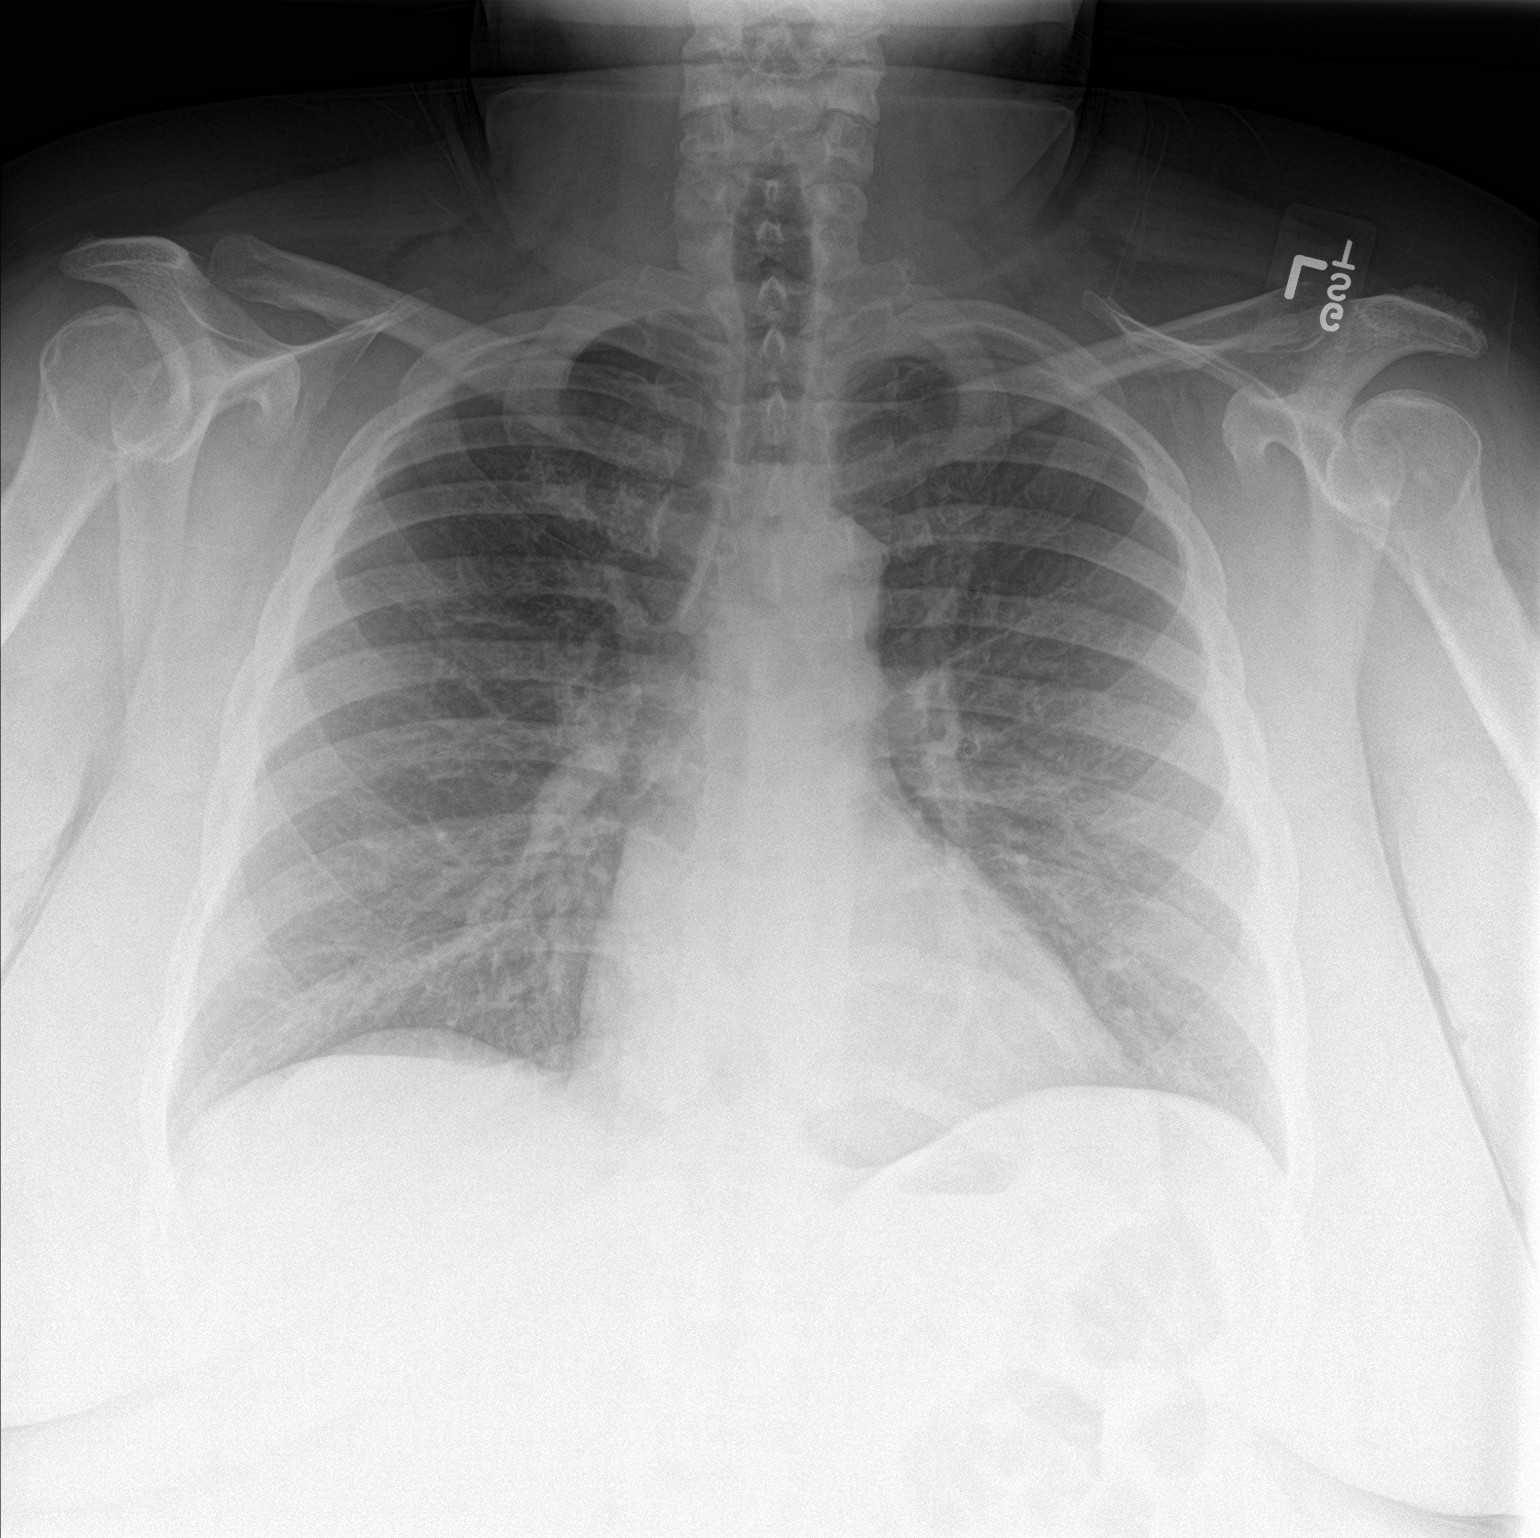

[chest lat]
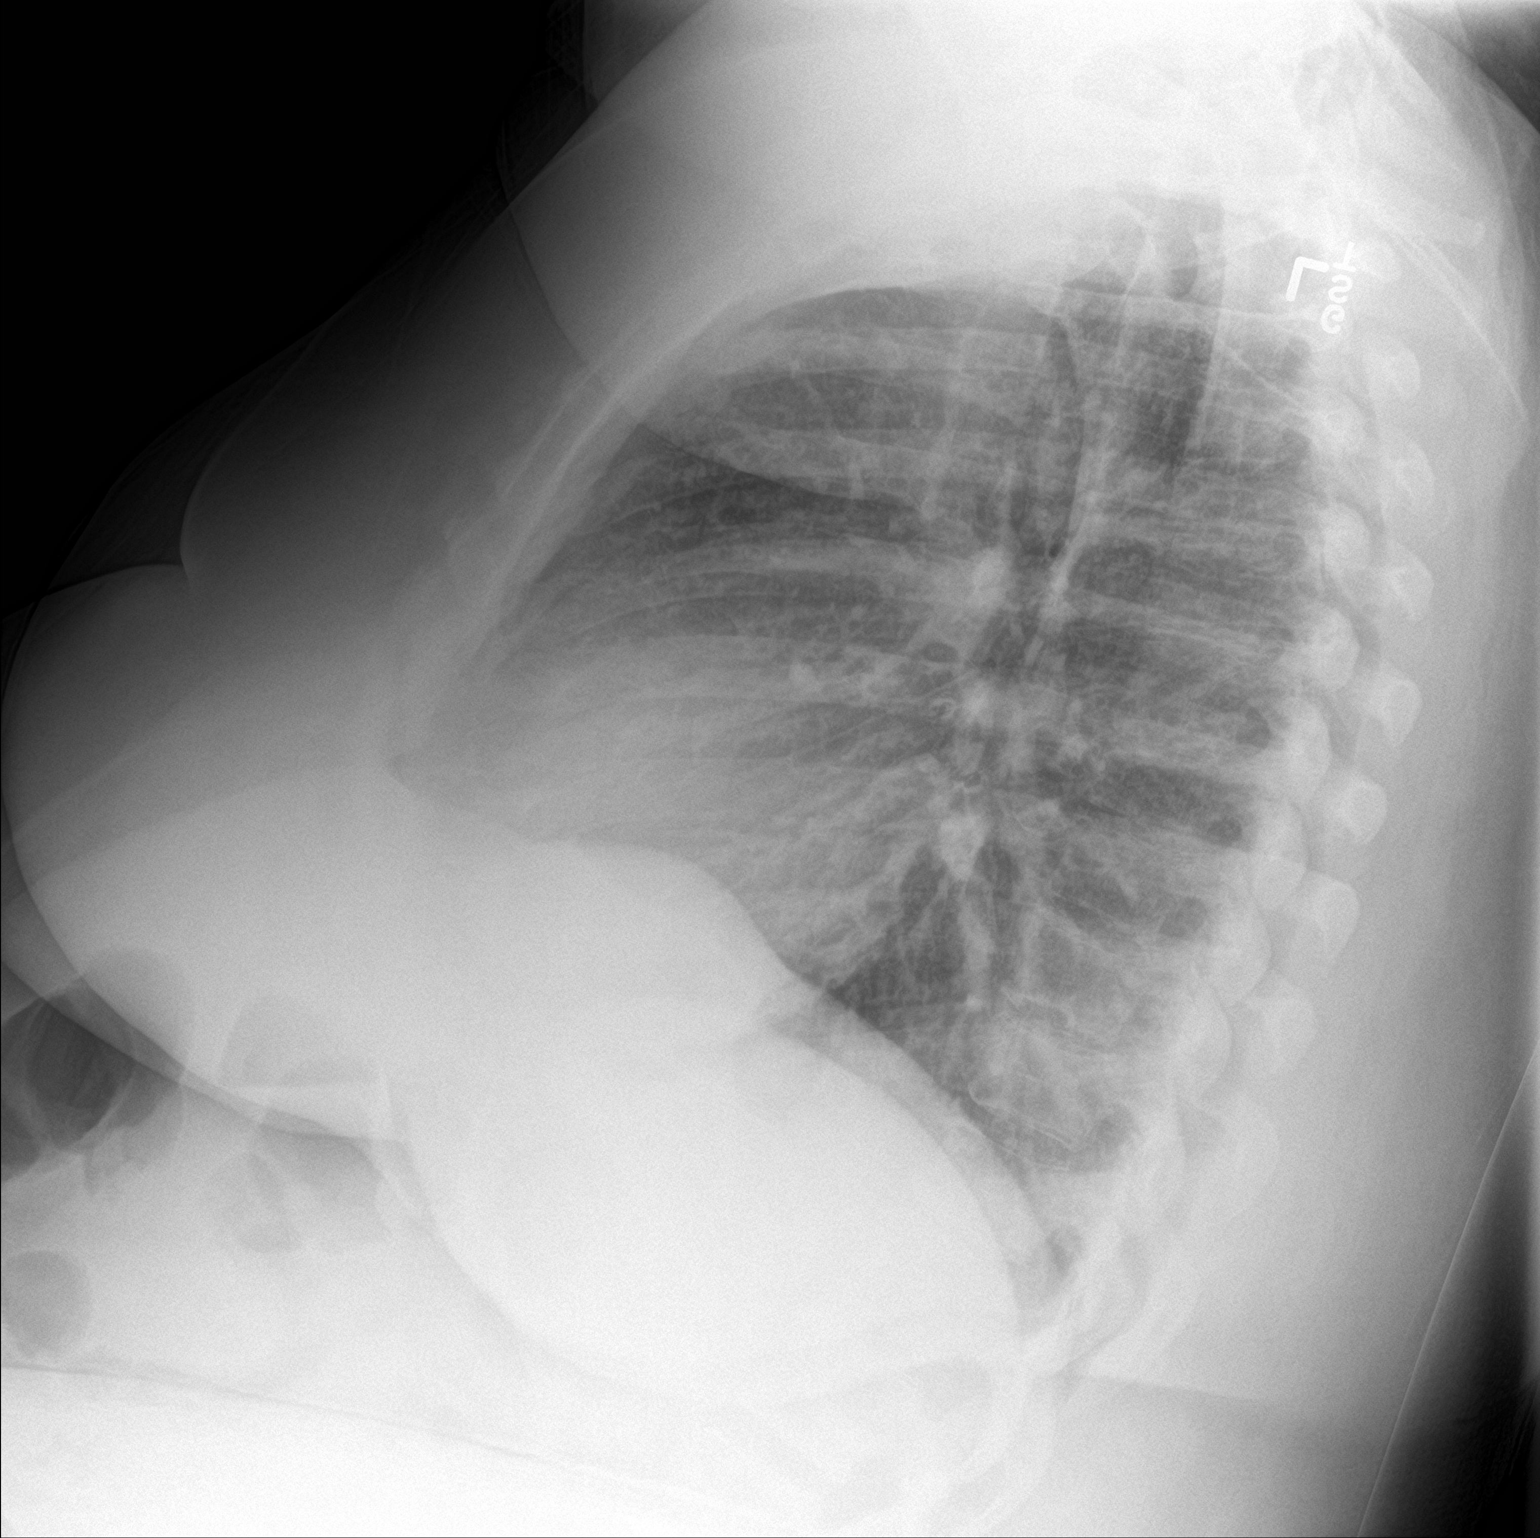

[2 of 2 positions shown; findings below may reference images not displayed]

FINDINGS: The lungs are clear. The pulmonary vasculature is normal. Heart size
is normal. Hilar and mediastinal contours are unremarkable. There is
no pleural effusion.
IMPRESSION: No active cardiopulmonary disease.

## 2018-02-26 IMAGING — DX DG CHEST 2V
2 series · 2 of 2 positions shown · non-contrast
Comparison: 02/04/2017

CLINICAL DATA: Increasing shortness of Breath

EXAM:
CHEST  2 VIEW

[w chest pa]
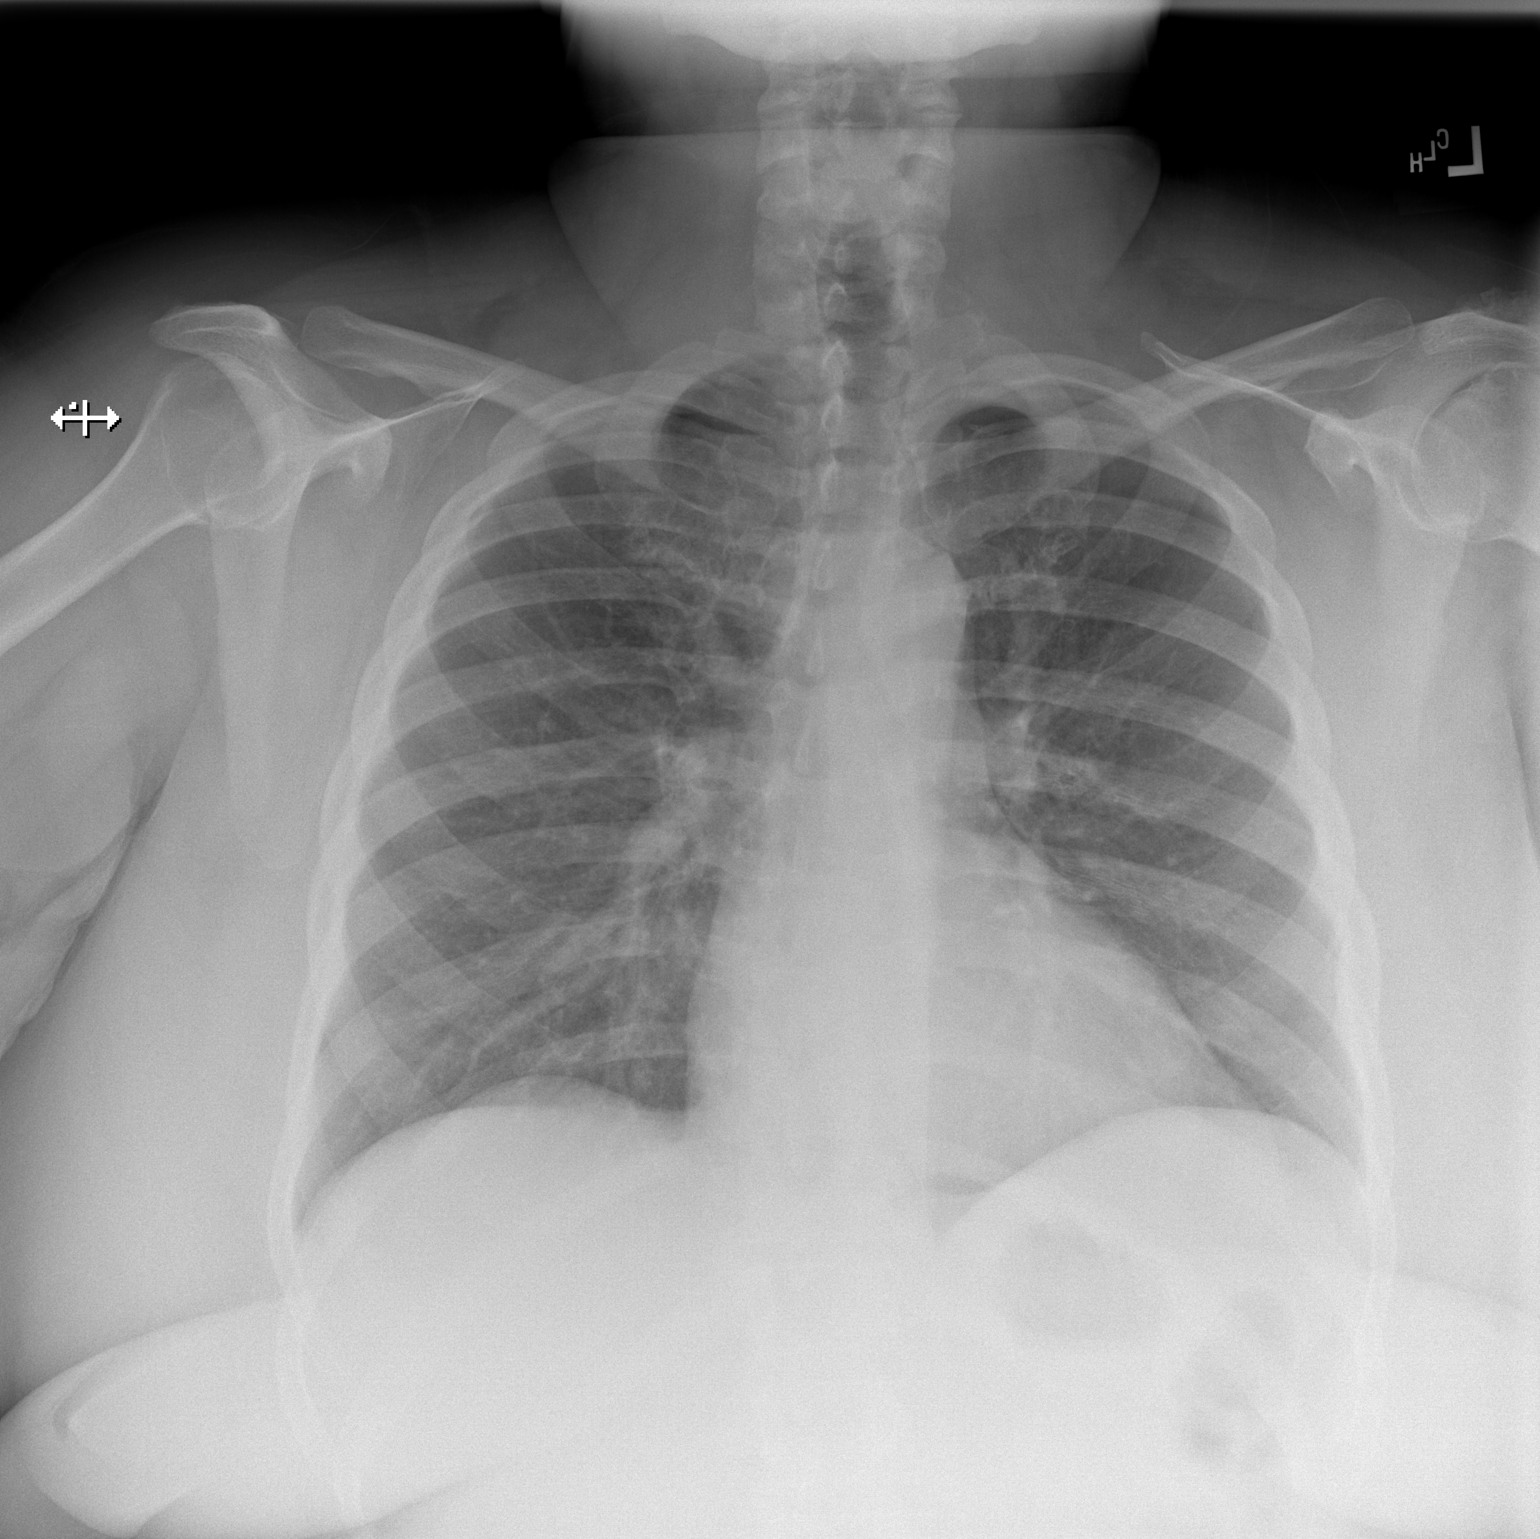

[w chest lat]
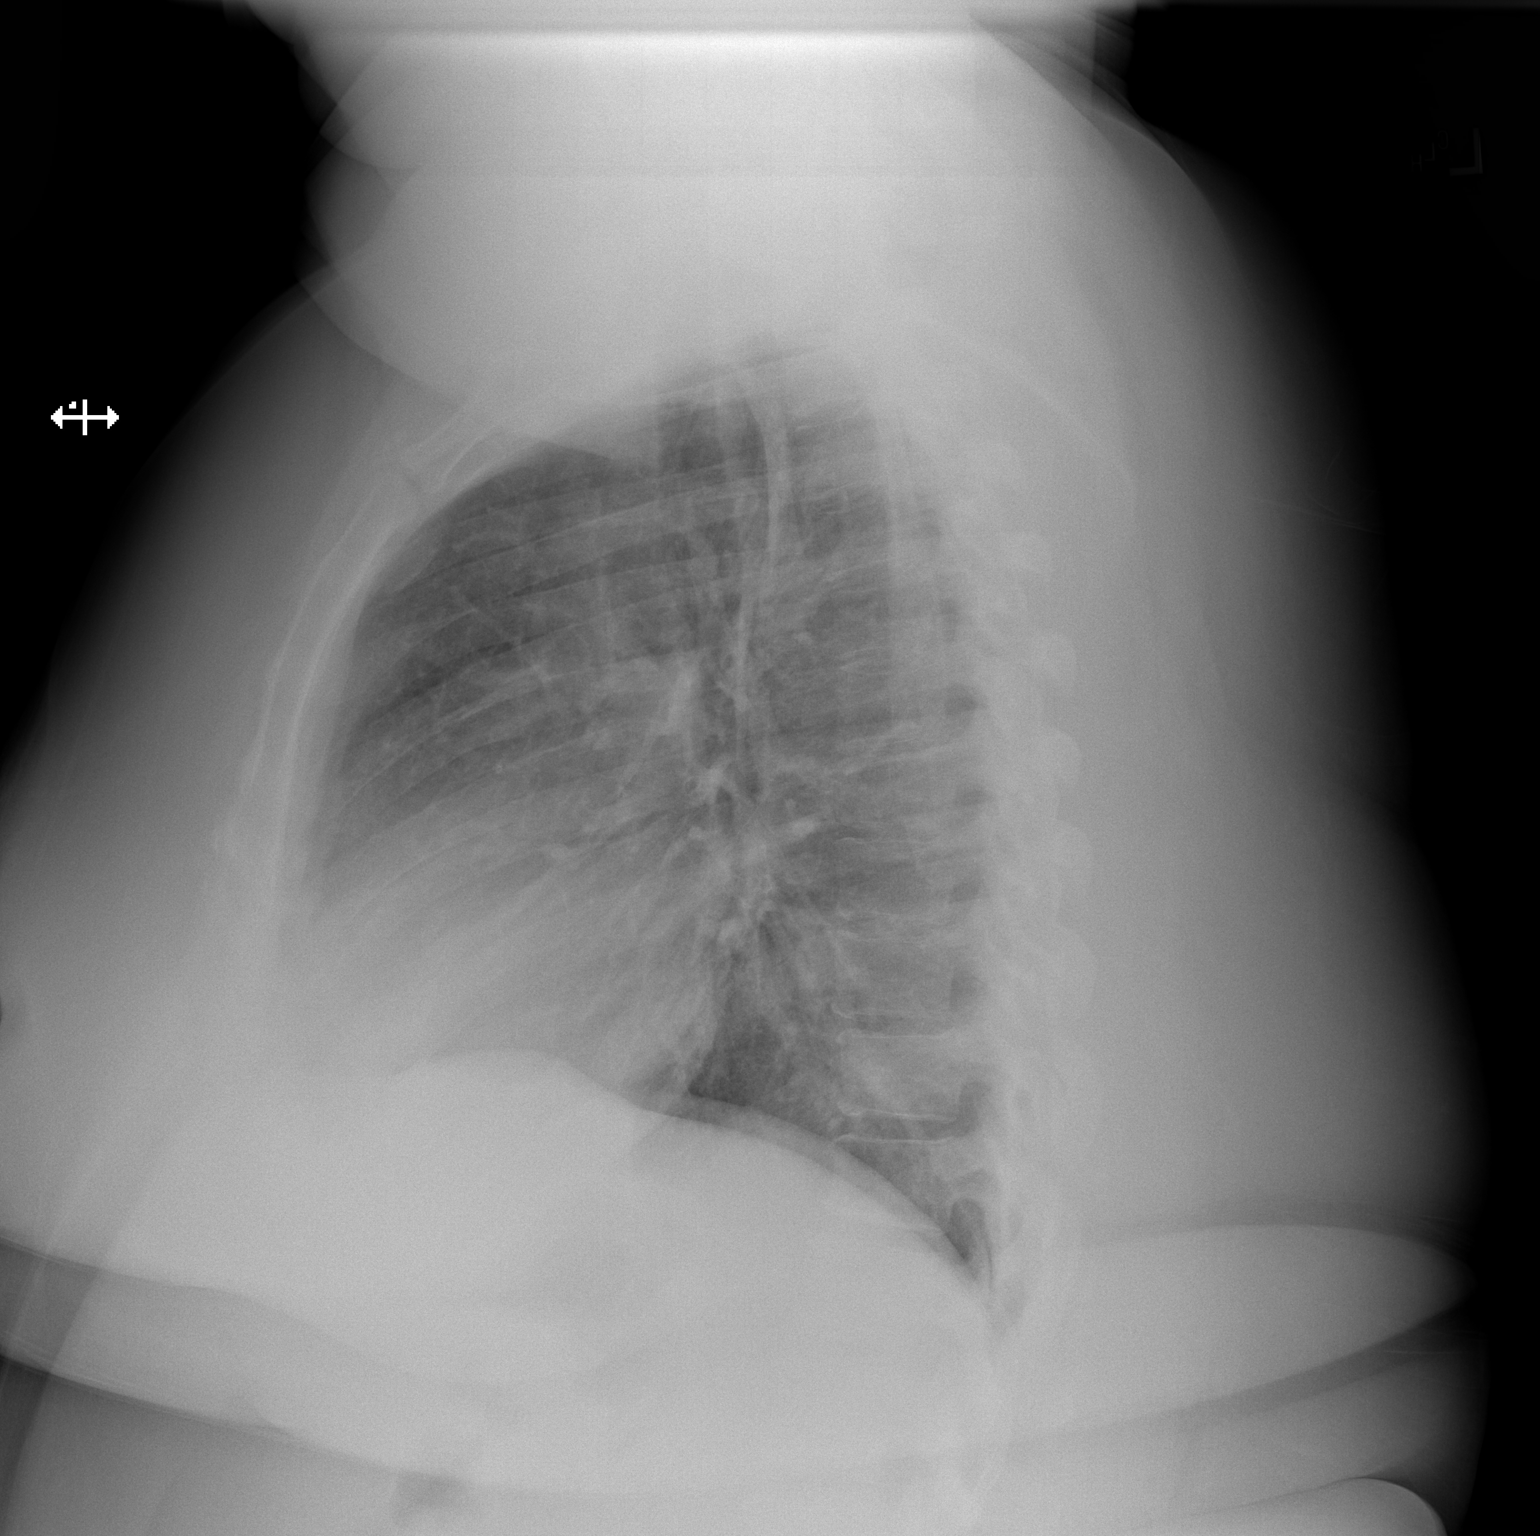

[2 of 2 positions shown; findings below may reference images not displayed]

FINDINGS: The heart size and mediastinal contours are within normal limits.
Both lungs are clear. The visualized skeletal structures are
unremarkable.
IMPRESSION: No active cardiopulmonary disease.

## 2018-03-02 IMAGING — DX DG CHEST 2V
2 series · 2 of 2 positions shown · non-contrast
Comparison: February 10, 2017

CLINICAL DATA: Shortness of breath and wheezing

EXAM:
CHEST  2 VIEW

[w chest pa]
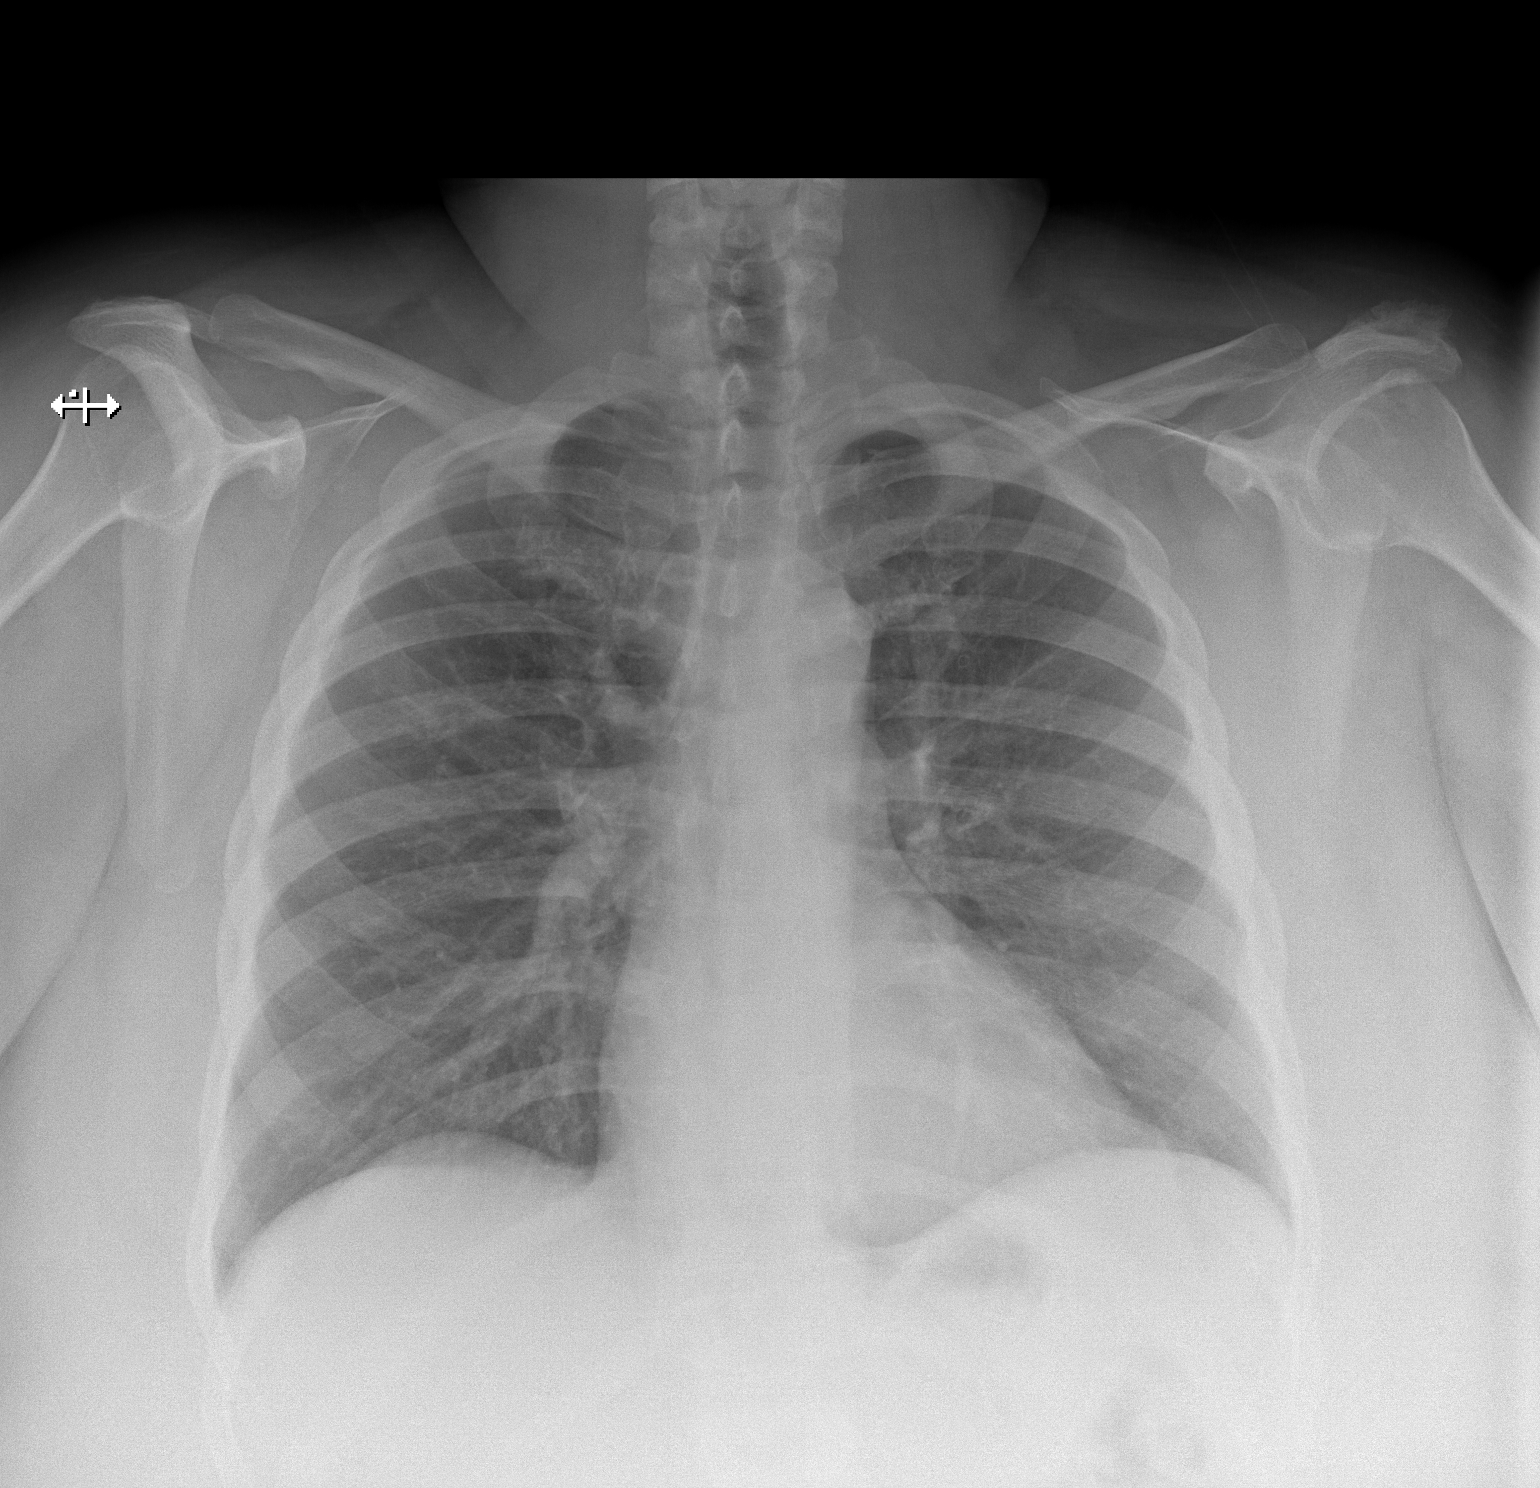

[w chest lat]
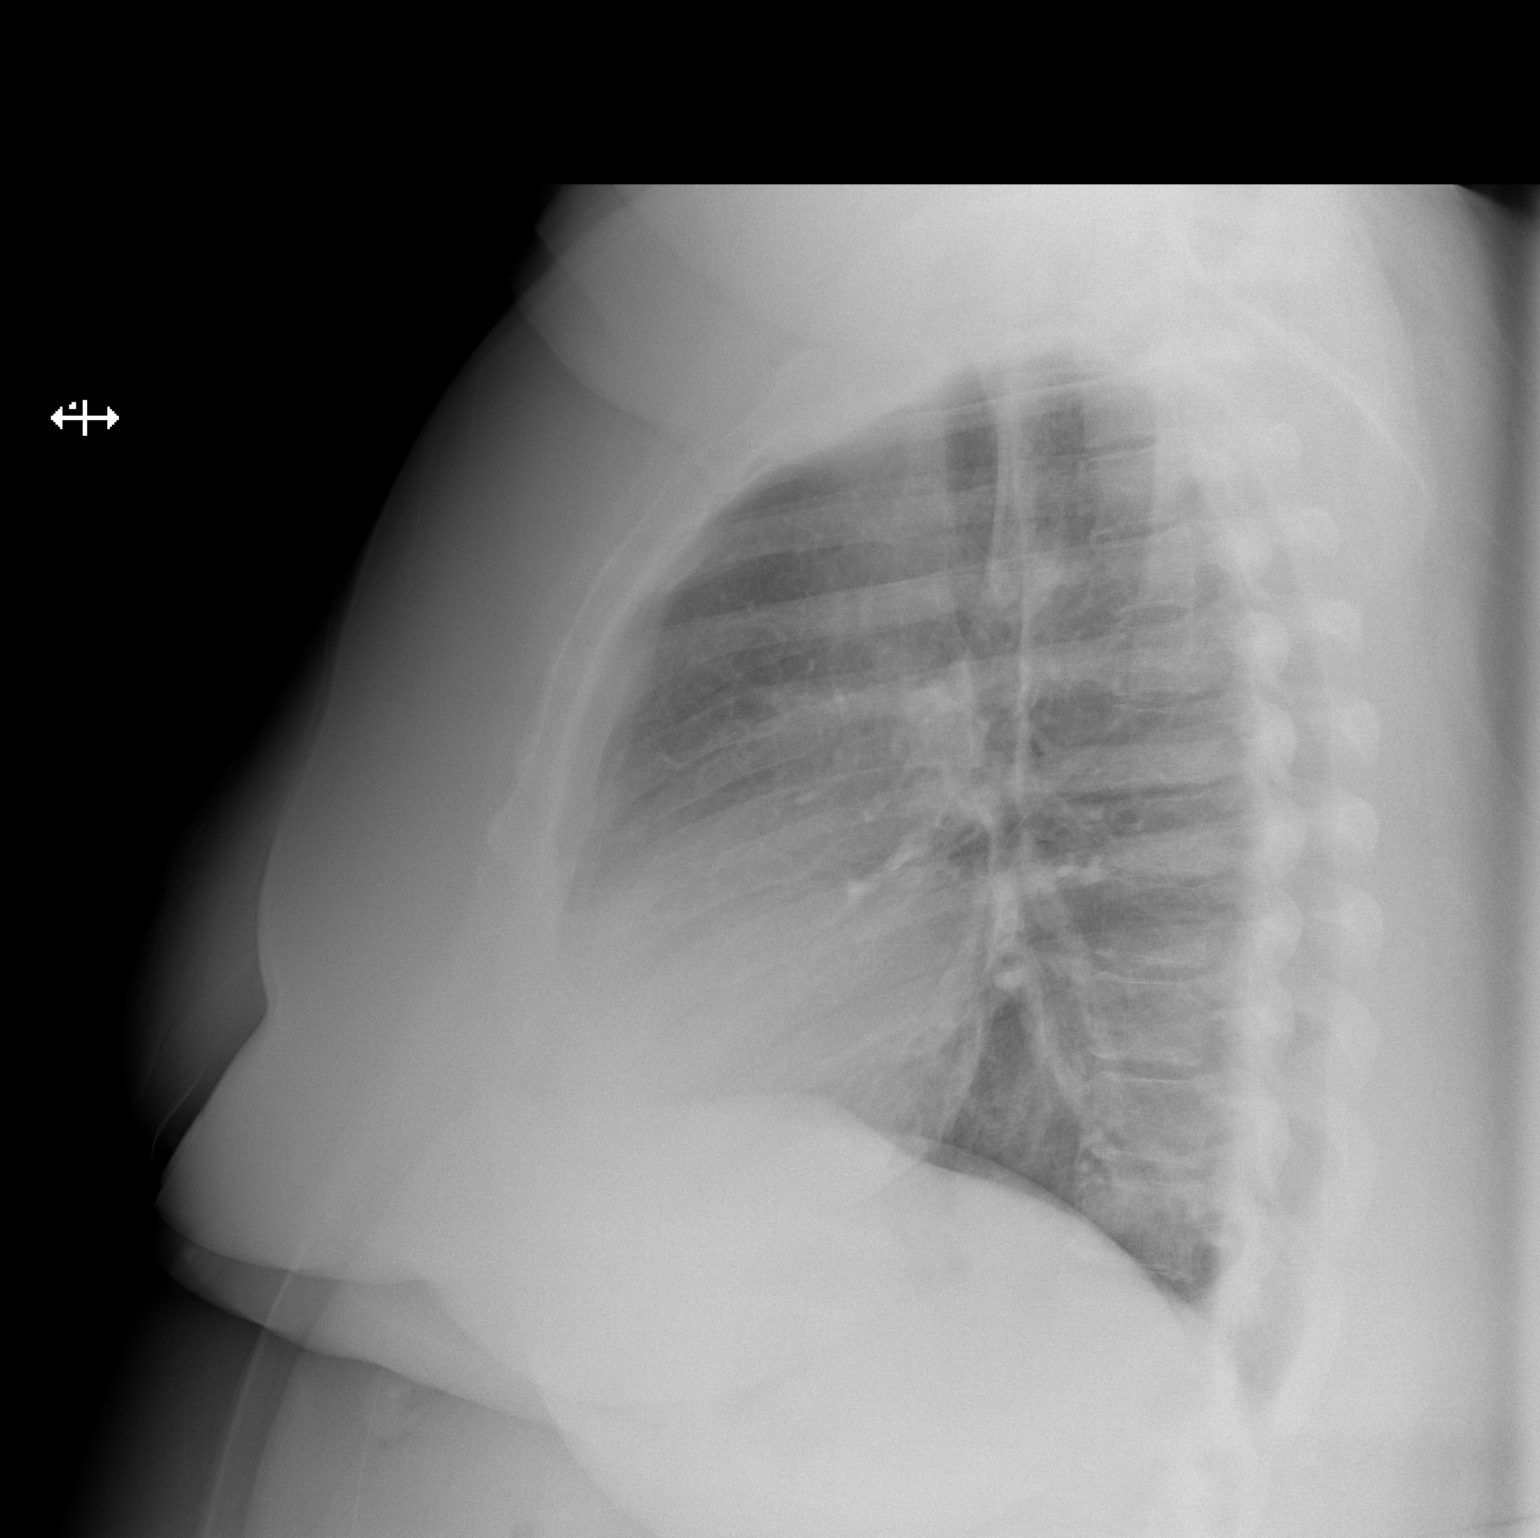

[2 of 2 positions shown; findings below may reference images not displayed]

FINDINGS: Lungs are clear. Heart size and pulmonary vascularity are normal. No
adenopathy. No bone lesions.
IMPRESSION: No edema or consolidation.

## 2018-03-11 ENCOUNTER — Other Ambulatory Visit: Payer: Self-pay

## 2018-03-11 ENCOUNTER — Ambulatory Visit: Payer: Medicaid Other | Attending: Family Medicine | Admitting: Physical Therapy

## 2018-03-11 ENCOUNTER — Encounter: Payer: Self-pay | Admitting: Physical Therapy

## 2018-03-11 DIAGNOSIS — M25562 Pain in left knee: Secondary | ICD-10-CM | POA: Insufficient documentation

## 2018-03-11 DIAGNOSIS — M25561 Pain in right knee: Secondary | ICD-10-CM | POA: Diagnosis present

## 2018-03-11 DIAGNOSIS — M25674 Stiffness of right foot, not elsewhere classified: Secondary | ICD-10-CM | POA: Insufficient documentation

## 2018-03-11 DIAGNOSIS — M6281 Muscle weakness (generalized): Secondary | ICD-10-CM

## 2018-03-11 DIAGNOSIS — M25675 Stiffness of left foot, not elsewhere classified: Secondary | ICD-10-CM

## 2018-03-11 DIAGNOSIS — G8929 Other chronic pain: Secondary | ICD-10-CM | POA: Diagnosis present

## 2018-03-12 ENCOUNTER — Encounter (HOSPITAL_COMMUNITY): Payer: Self-pay | Admitting: *Deleted

## 2018-03-12 ENCOUNTER — Other Ambulatory Visit: Payer: Self-pay

## 2018-03-12 ENCOUNTER — Emergency Department (HOSPITAL_COMMUNITY): Payer: Medicaid Other

## 2018-03-12 ENCOUNTER — Emergency Department (HOSPITAL_COMMUNITY)
Admission: EM | Admit: 2018-03-12 | Discharge: 2018-03-12 | Disposition: A | Discharge status: Home / Self Care | Payer: Medicaid Other | Attending: Emergency Medicine | Admitting: Emergency Medicine

## 2018-03-12 ENCOUNTER — Encounter: Payer: Self-pay | Admitting: Physical Therapy

## 2018-03-12 DIAGNOSIS — I110 Hypertensive heart disease with heart failure: Secondary | ICD-10-CM | POA: Insufficient documentation

## 2018-03-12 DIAGNOSIS — J4541 Moderate persistent asthma with (acute) exacerbation: Secondary | ICD-10-CM | POA: Diagnosis present

## 2018-03-12 DIAGNOSIS — Z79899 Other long term (current) drug therapy: Secondary | ICD-10-CM | POA: Diagnosis not present

## 2018-03-12 DIAGNOSIS — I5032 Chronic diastolic (congestive) heart failure: Secondary | ICD-10-CM | POA: Insufficient documentation

## 2018-03-12 DIAGNOSIS — Z87891 Personal history of nicotine dependence: Secondary | ICD-10-CM | POA: Insufficient documentation

## 2018-03-12 LAB — I-STAT CHEM 8, ED
BUN: 8 mg/dL (ref 6–20)
CALCIUM ION: 1.17 mmol/L (ref 1.15–1.40)
CHLORIDE: 101 mmol/L (ref 98–111)
Creatinine, Ser: 0.6 mg/dL (ref 0.44–1.00)
GLUCOSE: 90 mg/dL (ref 70–99)
HCT: 40 % (ref 36.0–46.0)
HEMOGLOBIN: 13.6 g/dL (ref 12.0–15.0)
Potassium: 3.3 mmol/L — ABNORMAL LOW (ref 3.5–5.1)
Sodium: 135 mmol/L (ref 135–145)
TCO2: 26 mmol/L (ref 22–32)

## 2018-03-12 MED ORDER — MAGNESIUM SULFATE 2 GM/50ML IV SOLN
INTRAVENOUS | Status: AC
  Filled 2018-03-12: qty 50

## 2018-03-12 MED ORDER — MAGNESIUM SULFATE 2 GM/50ML IV SOLN
2.0000 g | Freq: Once | INTRAVENOUS | Status: AC
  Administered 2018-03-12: 2 g via INTRAVENOUS

## 2018-03-12 MED ORDER — PREDNISONE 20 MG PO TABS
ORAL_TABLET | ORAL | Status: AC
  Filled 2018-03-12: qty 1

## 2018-03-12 MED ORDER — IPRATROPIUM-ALBUTEROL 0.5-2.5 (3) MG/3ML IN SOLN
3.0000 mL | RESPIRATORY_TRACT | Status: AC
  Administered 2018-03-12 (×3): 3 mL via RESPIRATORY_TRACT
  Filled 2018-03-12: qty 6
  Filled 2018-03-12: qty 3

## 2018-03-12 MED ORDER — PREDNISONE 20 MG PO TABS: ORAL_TABLET | ORAL | 0 refills | Status: DC

## 2018-03-12 MED ORDER — IPRATROPIUM-ALBUTEROL 0.5-2.5 (3) MG/3ML IN SOLN
3.0000 mL | RESPIRATORY_TRACT | Status: AC
  Administered 2018-03-12: 3 mL via RESPIRATORY_TRACT

## 2018-03-12 MED ORDER — PREDNISONE 20 MG PO TABS
60.0000 mg | ORAL_TABLET | Freq: Once | ORAL | Status: AC
  Administered 2018-03-12: 60 mg via ORAL

## 2018-03-12 MED ORDER — IPRATROPIUM-ALBUTEROL 0.5-2.5 (3) MG/3ML IN SOLN
RESPIRATORY_TRACT | Status: AC
  Filled 2018-03-12: qty 3

## 2018-03-12 NOTE — ED Notes (Signed)
EDP explained plan of care to pt.  

## 2018-03-12 NOTE — ED Triage Notes (Signed)
Pt having asthma exacerbation x 2 days, unrelieved with treatments and inhaler at home.

## 2018-03-12 NOTE — ED Provider Notes (Signed)
Emergency Department Provider Note   I have reviewed the triage vital signs and the nursing notes.   HISTORY  Chief Complaint Asthma   HPI April [name] is a 47 y.o. female with h/o severe asthma who presents with 2-3 days of worsening non-productive cough and wheezing similar to previous asthma exacerbations. No fevers. No other associated symptoms.   No other associated or modifying symptoms.    Past Medical History:  Diagnosis Date  . Acanthosis nigricans   . Anxiety   . Arthritis    "knees" (04/28/2017)  . Asthma    Followed by Dr. Annamaria Boots (pulmonology); receives every other week omalizumab injections; has frequent exacerbations  . Chronic diastolic CHF (congestive heart failure) ([place]) 01/17/2017  . COPD (chronic obstructive pulmonary disease) ([place])    PFTs in 2002, FEV1/FVC 65, no post bronchodilater test done  . Depression   . GERD (gastroesophageal reflux disease)   . Headache(784.0)    "q couple days" (04/28/2017)  . Helicobacter pylori (H. pylori) infection   . Hypertension, essential   . Insomnia   . Menorrhagia   . Morbid obesity ([place])   . OSA on CPAP    Sleep study 2008 - mild OSA, not enough events to titrate CPAP; wears CPAP now/pt on 04/28/2017  . Pneumonia X 1  . Seasonal allergies   . Shortness of breath   . Tobacco user     Patient Active Problem List   Diagnosis Date Noted  . Urinary incontinence 11/17/2017  . Nausea with vomiting 11/06/2017  . Hypomagnesemia 10/27/2017  . Dysphagia 10/27/2017  . Physical deconditioning 10/25/2017  . Acute maxillary sinusitis 10/24/2017  . Emesis, persistent 10/08/2017  . Volume depletion 10/08/2017  . Pleuritic chest pain 10/08/2017  . SOB (shortness of breath) 10/08/2017  . Debility 09/22/2017  . HCAP (healthcare-associated pneumonia) 08/06/2017  . Chronic atrial fibrillation 07/29/2017  . COPD with acute exacerbation ([place]) 02/10/2017  . Dyspnea 02/04/2017  . Chronic diastolic CHF (congestive heart  failure) ([place]) 01/17/2017  . Metabolic syndrome 16/01/9603  . Moderate persistent asthma with exacerbation 10/19/2016  . Normocytic anemia 10/02/2016  . Elevated hemoglobin A1c 05/05/2016  . GERD (gastroesophageal reflux disease) 08/30/2015  . Anxiety and depression 08/30/2015  . Generalized anxiety disorder 08/30/2015  . Seasonal allergic rhinitis 08/29/2013  . Leukocytosis 10/21/2012  . Tobacco abuse in remission 10/07/2012  . COPD mixed type ([place]) 05/07/2012  . Knee pain, bilateral 04/25/2011  . Primary insomnia 03/14/2011  . OSA on CPAP 12/19/2010  . Obstructive sleep apnea 12/19/2010  . Hypokalemia 08/13/2010  . Cervical back pain with evidence of disc disease 04/08/2008  . Essential hypertension 07/31/2006  . Morbid obesity with body mass index of 50.0-59.9 in adult ([place]) 06/17/2006  . Major depressive disorder, recurrent episode ([place]) 04/10/2006    Past Surgical History:  Procedure Laterality Date  . CARDIOVERSION N/A 05/30/2017   Procedure: CARDIOVERSION;  Surgeon: Sanda Klein, MD;  Location: [facility] ENDOSCOPY;  Service: Cardiovascular;  Laterality: N/A;  . REDUCTION MAMMAPLASTY Bilateral 09/2011  . TUBAL LIGATION  1996   bilateral    Current Outpatient Rx  . Order #: [id number] Class: Historical Med  . Order #: [id number] Class: Historical Med  . Order #: [id number] Class: Normal  . Order #: [id number] Class: No Print  . Order #: [id number] Class: Historical Med  . Order #: [id number] Class: Historical Med  . Order #: [id number] Class: Historical Med  . Order #: [id number] Class: Historical Med  . Order #: [id number] Class: Historical Med  . Order #: [id number] Class:  Historical Med  . Order #: [id number] Class: Normal  . Order #: [id number] Class: Normal  . Order #: [id number] Class: Historical Med  . Order #: [id number] Class: Historical Med  . Order #: [id number] Class: Normal  . Order #: [id number] Class: Print    Allergies Contrast media [iodinated diagnostic agents] and  Oxycodone-acetaminophen  Family History  Problem Relation Age of Onset  . Hypertension Mother   . Asthma Daughter   . Cancer Paternal Aunt   . Asthma Maternal Grandmother     Social History Social History   Tobacco Use  . Smoking status: Former Smoker    Packs/day: 0.50    Years: 26.00    Pack years: 13.00    Types: Cigarettes    Last attempt to quit: 09/12/2014    Years since quitting: 3.4  . Smokeless tobacco: Never Used  Substance Use Topics  . Alcohol use: No  . Drug use: No    Review of Systems  All other systems negative except as documented in the HPI. All pertinent positives and negatives as reviewed in the HPI. ____________________________________________   PHYSICAL EXAM:  VITAL SIGNS: ED Triage Vitals [03/12/18 0019]  Enc Vitals Group     BP (!) 149/88     Pulse Rate 76     Resp 16     Temp 98.3 F (36.8 C)     Temp src      SpO2 100 %    Constitutional: Alert and oriented. Well appearing and in no acute distress. Eyes: Conjunctivae are normal. PERRL. EOMI. Head: Atraumatic. Nose: No congestion/rhinnorhea. Mouth/Throat: Mucous membranes are moist.  Oropharynx non-erythematous. Neck: No stridor.  No meningeal signs.   Cardiovascular: Normal rate, regular rhythm. Good peripheral circulation. Grossly normal heart sounds.   Respiratory: Normal respiratory effort.  No retractions. Lungs wheezing but moving good amount of air. Gastrointestinal: Soft and nontender. No distention.  Musculoskeletal: No lower extremity tenderness nor edema. No gross deformities of extremities. Neurologic:  Normal speech and language. No gross focal neurologic deficits are appreciated.  Skin:  Skin is warm, dry and intact. No rash noted.   ____________________________________________   LABS (all labs ordered are listed, but only abnormal results are displayed)  Labs Reviewed  I-STAT CHEM 8, ED - Abnormal; Notable for the following components:      Result Value    Potassium 3.3 (*)    All other components within normal limits   ____________________________________________  RADIOLOGY  Dg Chest 2 View  Result Date: 03/12/2018 CLINICAL DATA:  Acute onset of shortness of breath. High blood pressure personal history of asthma. EXAM: CHEST - 2 VIEW COMPARISON:  Chest radiograph performed 10/14/2017 FINDINGS: The lungs are well-aerated. Mild vascular congestion is noted. Minimal bibasilar atelectasis is seen. There is no evidence of pleural effusion or pneumothorax. The heart is normal in size; the mediastinal contour is within normal limits. No acute osseous abnormalities are seen. IMPRESSION: Mild vascular congestion noted. Minimal bibasilar atelectasis seen. Electronically Signed   By: Garald Balding M.D.   On: 03/12/2018 02:18    ____________________________________________   PROCEDURES  Procedure(s) performed:   Procedures   ____________________________________________   INITIAL IMPRESSION / ASSESSMENT AND PLAN / ED COURSE  Patient's typical asthma exacerbation symptoms.  Had been on ECMO in the past for her asthma however states this is not nearly as bad.  Will treat for asthma exacerbation but she has minimal tachypnea, normal oxygen saturations I suspect she likely will be discharged.  Taking into consideration her severe asthma history  I still do not think the patient requires admission at this time.  She persistently had normal oxygen saturations and no tachypnea.  Her respiratory rate was consistently normal throughout her whole stay.  She had no evidence of respiratory distress.  Her lungs opened up significantly even though she still had some wheezing.  We will give her prednisone and suggest every 4 hour albuterol at home with as needed in between.  Will return here for any worsening symptoms otherwise follow-up with Dr. in 1 to 2 days.  Pertinent labs & imaging results that were available during my care of the patient were reviewed by  me and considered in my medical decision making (see chart for details).  ____________________________________________  FINAL CLINICAL IMPRESSION(S) / ED DIAGNOSES  Final diagnoses:  Moderate persistent asthma with exacerbation     MEDICATIONS GIVEN DURING THIS VISIT:  Medications  ipratropium-albuterol (DUONEB) 0.5-2.5 (3) MG/3ML nebulizer solution 3 mL ( Nebulization Not Given 03/12/18 0418)  predniSONE (DELTASONE) tablet 60 mg (60 mg Oral Given 03/12/18 0110)  magnesium sulfate IVPB 2 g 50 mL (0 g Intravenous Stopped 03/12/18 0145)  ipratropium-albuterol (DUONEB) 0.5-2.5 (3) MG/3ML nebulizer solution 3 mL (3 mLs Nebulization Given 03/12/18 0418)     NEW OUTPATIENT MEDICATIONS STARTED DURING THIS VISIT:  Discharge Medication List as of 03/12/2018  5:02 AM    START taking these medications   Details  predniSONE (DELTASONE) 20 MG tablet 2 tabs po daily x 4 days, Print        Note:  This note was prepared with assistance of Dragon voice recognition software. Occasional wrong-word or sound-a-like substitutions may have occurred due to the inherent limitations of voice recognition software.   Merrily Pew, MD 03/12/18 [id number]

## 2018-03-12 NOTE — Therapy (Addendum)
[place], [place], [address] Phone: [phone]   Fax:  [phone]  Physical Therapy Evaluation  Patient Details  Name: April [name] MRN: [id number] Date of Birth: November 10, 1970 Referring Provider (PT): Elenore Paddy    Encounter Date: 03/11/2018  PT End of Session - 03/11/18 1716    Visit Number  1    Number of Visits  4    Date for PT Re-Evaluation  04/08/18    Authorization Type  Medicaid     PT Start Time  1500    PT Stop Time  1546    PT Time Calculation (min)  46 min    Activity Tolerance  Patient limited by pain    Behavior During Therapy  [facility] for tasks assessed/performed       Past Medical History:  Diagnosis Date  . Acanthosis nigricans   . Anxiety   . Arthritis    "knees" (04/28/2017)  . Asthma    Followed by Dr. Annamaria Boots (pulmonology); receives every other week omalizumab injections; has frequent exacerbations  . Chronic diastolic CHF (congestive heart failure) ([place]) 01/17/2017  . COPD (chronic obstructive pulmonary disease) ([place])    PFTs in 2002, FEV1/FVC 65, no post bronchodilater test done  . Depression   . GERD (gastroesophageal reflux disease)   . Headache(784.0)    "q couple days" (04/28/2017)  . Helicobacter pylori (H. pylori) infection   . Hypertension, essential   . Insomnia   . Menorrhagia   . Morbid obesity ([place])   . OSA on CPAP    Sleep study 2008 - mild OSA, not enough events to titrate CPAP; wears CPAP now/pt on 04/28/2017  . Pneumonia X 1  . Seasonal allergies   . Shortness of breath   . Tobacco user     Past Surgical History:  Procedure Laterality Date  . CARDIOVERSION N/A 05/30/2017   Procedure: CARDIOVERSION;  Surgeon: Sanda Klein, MD;  Location: [facility];  Service: Cardiovascular;  Laterality: N/A;  . REDUCTION MAMMAPLASTY Bilateral 09/2011  . TUBAL LIGATION  1996   bilateral    There were no vitals filed for this visit.   Subjective Assessment  - 03/11/18 1504    Subjective  Pt was in hospital for 4  months and says she stayed in hospital bed mostly for over 2 months with pnemonea and flu in April 2019. Reports that she lost alot of strength in Bil LE as result.  Having pain constantly in Bil LE when moving throughout the day, with numbness/ tingling in bil feet. When laying down pain and numbness in feet worsen. Pain mostly in mid thigh bilateraly down to feet both anterior andposteriorly.  Feels that pain has improved some in legs but feet is worsening. Prior to hospitilization had bil knee pain due to arthritis.     Limitations  Standing;Walking    How long can you sit comfortably?  didn't quanitfy how long     How long can you stand comfortably?  5 minutes     How long can you walk comfortably?  5 minutes     Patient Stated Goals  Decrease pain in Bil LE     Currently in Pain?  Yes    Pain Score  8     Pain Location  Leg    Pain Orientation  Right;Left    Pain Descriptors / Indicators  Burning;Throbbing   Burning in foot aching in legs   Pain Type  Chronic pain  Pain Radiating Towards  Radiating pain from mid thigh into foot Bil     Pain Onset  More than a month ago    Pain Frequency  Constant    Aggravating Factors   walking, standing     Pain Relieving Factors  sit down or lay increases pain in feet ; standing and walking increase pain in legs more     Effect of Pain on Daily Activities  Decreased mobility          [facility] PT Assessment - 03/12/18 0842      Assessment   Medical Diagnosis  Bilateral leg pain     Referring Provider (PT)  Jerilee Hoh, [place]     Onset Date/Surgical Date  --   April 2019   Hand Dominance  Right    Next MD Visit  None schedueled    Prior Therapy  --   Previous therapy after hospitilization      Precautions   Precautions  None      Restrictions   Weight Bearing Restrictions  No      Balance Screen   Has the patient fallen in the past 6 months  Yes    How many times?  2   has fallen  out of bed 2x    Has the patient had a decrease in activity level because of a fear of falling?   No      Home Environment   Living Environment  --   duplex   Additional Comments  3 steps       Prior Function   Level of Independence  Independent    Vocation  --   not currently employed   Leisure  walking and moving around more       Cognition   Overall Cognitive Status  Within Functional Limits for tasks assessed    Attention  Focused    Focused Attention  Appears intact    Memory  Appears intact    Awareness  Appears intact    Problem Solving  Appears intact      Observation/Other Assessments   Focus on Therapeutic Outcomes (FOTO)   Medicaid       Sensation   Light Touch  Appears Intact   Able to feel bottom and top of foot despite numbness   Stereognosis  Appears Intact    Hot/Cold  Appears Intact    Proprioception  Appears Intact    Additional Comments  Paresthesias into bil feet       Coordination   Gross Motor Movements are Fluid and Coordinated  Not tested    Fine Motor Movements are Fluid and Coordinated  Not tested      Posture/Postural Control   Posture Comments  rounded shoulders;       AROM   Right Hip Flexion  85   increased pain    Right Hip External Rotation   --   limited 50%   Right Hip Internal Rotation   --   limited 50%    Right Hip ABduction  --   limited 50 %by pain   Right Hip ADduction  --   limiated 50 %by pain    Left Hip Flexion  90   pain   Left Hip External Rotation   --   limited 50%   Left Hip Internal Rotation   --   limited 50% pain   Right Knee Extension  4   of hyperextension   Right Knee Flexion  98  Left Knee Extension  6   of hyperextension   Left Knee Flexion  103    Right Ankle Dorsiflexion  -11    Left Ankle Dorsiflexion  -8    Lumbar Extension  Limited 50%   pain in back   Lumbar - Right Side Bend  limited 50%    Lumbar - Left Side Bend  limited 50%    Lumbar - Right Rotation  limited 50%    Lumbar - Left  Rotation  limited 50%       PROM   Right Knee Flexion  105    Left Knee Flexion  110      Strength   Right Hip Flexion  3/5    Right Hip ABduction  3+/5    Right Hip ADduction  3+/5   pain in mid thgh   Left Hip ABduction  3+/5    Left Hip ADduction  3+/5    Right Knee Flexion  3+/5   pain in posterior knee   Right Knee Extension  3+/5    Left Knee Flexion  3+/5    Left Knee Extension  3+/5    Right Ankle Inversion  3/5   pain on top of bil feet    Right Ankle Eversion  3+/5    Left Ankle Dorsiflexion  3+/5    Left Ankle Inversion  3/5    Left Ankle Eversion  3+/5      Palpation   Palpation comment  decreased movement of patella bilaterall      Ambulation/Gait   Gait Comments  significant bil valgus, hyperxtension in mid stance, and bil supination                 Objective measurements completed on examination: See above findings.      [facility] Adult PT Treatment/Exercise - 03/12/18 0001      Knee/Hip Exercises: Stretches   Other Knee/Hip Stretches  seated calf stretch x30sec      Knee/Hip Exercises: Supine   Other Supine Knee/Hip Exercises  clamshells x10 yellow t-band      Ankle Exercises: Stretches   Other Stretch  AROM dorsiflexion, Inversion, Eversion Bilatral 1x5                PT Short Term Goals - 03/11/18 1735      PT SHORT TERM GOAL #1   Title  Increased bilateral knee flexor and extensor strength to 4-/5 to improve functional mobility and gait     Baseline  gross 3+/ 5     Time  4    Period  Weeks    Status  New    Target Date  04/08/18      PT SHORT TERM GOAL #2   Title  Pt will improve bilateral ankle dorsiflexion by 5 degrees     Time Baseline: -11 right -8 left     4      Period  Weeks    Status  New    Target Date  04/08/18      PT SHORT TERM GOAL #3   Title  Pt will be able to ambulate 5 minutes without increased pain in legs or feet     Time Baseline: Can not ambulate over a minute    4      Period  Weeks     Status  New    Target Date  04/08/18  Plan - 03/11/18 1717    Clinical Impression Statement  Pt is a 47 y.o. female presenting with bilateral LE paresthesias from mid thighs into feet, decreased bilateral LE ROM and weakness. Patient reports laying supine increases Bil feet pain and tingling, but decreases anterior thigh pain while sitting upright decreases symptoms in feet and worsens symptoms in legs. Patient reports pain and paresthesias began after 4 month hospital stay due to pnemonia and the flu and has progressively worsened impacting amublation and overall activity. Patient ambulating with significant knee valgus, genu recurvatum, and supination with slow gait speed and mild SOB walking less than 15 feet. Pt would benefit from skilled PT in order to address deficits.     History and Personal Factors relevant to plan of care:  COPD, diastolic CHF, a-fib, S2GB, asthma, morbid obesity, SOB    Clinical Presentation  Evolving    Clinical Presentation due to:  Pain and paresthesias progressivly worsening mobility following 35month hospital stay from pnemonia and flu     Clinical Decision Making  High    Rehab Potential  Fair    Clinical Impairments Affecting Rehab Potential  Morbid obesity,     PT Frequency  1x / week    PT Duration  4 weeks    PT Treatment/Interventions  ADLs/Self Care Home Management;Cryotherapy;Electrical Stimulation;Iontophoresis 4mg /ml Dexamethasone;Moist Heat;Traction;Ultrasound;Gait training;DME Instruction;Stair training;Functional mobility training;Therapeutic activities;Therapeutic exercise;Balance training;Neuromuscular re-education;Patient/family education;Manual techniques;Passive range of motion;Taping;Vasopneumatic Device;Joint Manipulations    PT Next Visit Plan  Assess TUG and 5x sit to stand, soft tissue mobilization to quads and/or hamstring, passive stretching of ankle and hip, quad sets, SAQ, clamshells, hamstring curls, towel  scrunches, 4 way ankle t-band, desensitization techniques, taping, possible further assessement of vascularity      PT Home Exercise Plan  Gastroc stretch with strap, clamshells with yellow t-band,  AROM of ankle dorsiflexion    Consulted and Agree with Plan of Care  Patient       Patient will benefit from skilled therapeutic intervention in order to improve the following deficits and impairments:  Abnormal gait, Decreased skin integrity, Pain, Cardiopulmonary status limiting activity, Decreased mobility, Postural dysfunction, Decreased activity tolerance, Decreased endurance, Decreased range of motion, Decreased strength, Hypomobility, Decreased balance, Difficulty walking, Obesity, Impaired flexibility  Visit Diagnosis: Chronic pain of left knee  Chronic pain of right knee  Muscle weakness (generalized)  Stiffness of right foot, not elsewhere classified  Stiffness of left foot, not elsewhere classified     Problem List Patient Active Problem List   Diagnosis Date Noted  . Urinary incontinence 11/17/2017  . Nausea with vomiting 11/06/2017  . Hypomagnesemia 10/27/2017  . Dysphagia 10/27/2017  . Physical deconditioning 10/25/2017  . Acute maxillary sinusitis 10/24/2017  . Emesis, persistent 10/08/2017  . Volume depletion 10/08/2017  . Pleuritic chest pain 10/08/2017  . SOB (shortness of breath) 10/08/2017  . Debility 09/22/2017  . HCAP (healthcare-associated pneumonia) 08/06/2017  . Chronic atrial fibrillation 07/29/2017  . COPD with acute exacerbation ([place]) 02/10/2017  . Dyspnea 02/04/2017  . Chronic diastolic CHF (congestive heart failure) ([place]) 01/17/2017  . Metabolic syndrome 15/17/6160  . Moderate persistent asthma with exacerbation 10/19/2016  . Normocytic anemia 10/02/2016  . Elevated hemoglobin A1c 05/05/2016  . GERD (gastroesophageal reflux disease) 08/30/2015  . Anxiety and depression 08/30/2015  . Generalized anxiety disorder 08/30/2015  . Seasonal allergic  rhinitis 08/29/2013  . Leukocytosis 10/21/2012  . Tobacco abuse in remission 10/07/2012  . COPD mixed type ([place]) 05/07/2012  . Knee pain, bilateral 04/25/2011  .  Primary insomnia 03/14/2011  . OSA on CPAP 12/19/2010  . Obstructive sleep apnea 12/19/2010  . Hypokalemia 08/13/2010  . Cervical back pain with evidence of disc disease 04/08/2008  . Essential hypertension 07/31/2006  . Morbid obesity with body mass index of 50.0-59.9 in adult ([place]) 06/17/2006  . Major depressive disorder, recurrent episode ([place]) 04/10/2006    Carney Living PT DPT  03/12/2018, 8:44 AM   Einar Crow SPT  03/12/2018   During this treatment session, the therapist was present, participating in and directing the treatment.   [place], [place], [address] Phone: [phone]   Fax:  [phone]  Name: LEXEE BRASHEARS MRN: [id number] Date of Birth: 11/15/1970

## 2018-03-13 ENCOUNTER — Other Ambulatory Visit: Payer: Self-pay | Admitting: Family Medicine

## 2018-03-16 NOTE — Telephone Encounter (Signed)
Pt last seen: 12/24/17 Next appt: 03/18/18 Last RX written on: 12/18/17 Date of original fill: 12/18/17 Date of refill(s): 01/17/18, 02/16/18  The following controlled substances were also filled during this time. Hydrocodone/APAP 5-325mg  #28tabs/7ds written by Elenore Paddy on 02/16/18, filled on 02/16/18 Hydrocodone/APAP 10/325 #120tabs/30ds written by Elenore Paddy on 02/23/18, filled on 02/23/18 Oxycodone/APAP #28tabs/7ds written by Elenore Paddy on 03/12/18, filled on 03/12/18   Please refill if appropriate.

## 2018-03-18 ENCOUNTER — Ambulatory Visit: Payer: Medicaid Other | Admitting: Family Medicine

## 2018-03-18 ENCOUNTER — Encounter: Payer: Self-pay | Admitting: Family Medicine

## 2018-03-18 ENCOUNTER — Ambulatory Visit: Payer: Medicaid Other | Attending: Family Medicine | Admitting: Family Medicine

## 2018-03-18 VITALS — BP 110/74 | HR 80 | Temp 98.0°F | Ht 66.0 in | Wt 297.0 lb

## 2018-03-18 DIAGNOSIS — G4733 Obstructive sleep apnea (adult) (pediatric): Secondary | ICD-10-CM | POA: Insufficient documentation

## 2018-03-18 DIAGNOSIS — F418 Other specified anxiety disorders: Secondary | ICD-10-CM | POA: Diagnosis not present

## 2018-03-18 DIAGNOSIS — Z881 Allergy status to other antibiotic agents status: Secondary | ICD-10-CM | POA: Diagnosis not present

## 2018-03-18 DIAGNOSIS — Z7952 Long term (current) use of systemic steroids: Secondary | ICD-10-CM | POA: Insufficient documentation

## 2018-03-18 DIAGNOSIS — Z6841 Body Mass Index (BMI) 40.0 and over, adult: Secondary | ICD-10-CM | POA: Insufficient documentation

## 2018-03-18 DIAGNOSIS — G8929 Other chronic pain: Secondary | ICD-10-CM | POA: Diagnosis not present

## 2018-03-18 DIAGNOSIS — I5032 Chronic diastolic (congestive) heart failure: Secondary | ICD-10-CM | POA: Diagnosis not present

## 2018-03-18 DIAGNOSIS — F5101 Primary insomnia: Secondary | ICD-10-CM | POA: Diagnosis not present

## 2018-03-18 DIAGNOSIS — J4541 Moderate persistent asthma with (acute) exacerbation: Secondary | ICD-10-CM | POA: Diagnosis not present

## 2018-03-18 DIAGNOSIS — I110 Hypertensive heart disease with heart failure: Secondary | ICD-10-CM | POA: Insufficient documentation

## 2018-03-18 DIAGNOSIS — F32A Depression, unspecified: Secondary | ICD-10-CM

## 2018-03-18 DIAGNOSIS — M25561 Pain in right knee: Secondary | ICD-10-CM

## 2018-03-18 DIAGNOSIS — Z79899 Other long term (current) drug therapy: Secondary | ICD-10-CM | POA: Insufficient documentation

## 2018-03-18 DIAGNOSIS — Z7901 Long term (current) use of anticoagulants: Secondary | ICD-10-CM | POA: Insufficient documentation

## 2018-03-18 DIAGNOSIS — E119 Type 2 diabetes mellitus without complications: Secondary | ICD-10-CM

## 2018-03-18 DIAGNOSIS — E1140 Type 2 diabetes mellitus with diabetic neuropathy, unspecified: Secondary | ICD-10-CM | POA: Diagnosis not present

## 2018-03-18 DIAGNOSIS — F419 Anxiety disorder, unspecified: Secondary | ICD-10-CM

## 2018-03-18 DIAGNOSIS — J449 Chronic obstructive pulmonary disease, unspecified: Secondary | ICD-10-CM | POA: Diagnosis not present

## 2018-03-18 DIAGNOSIS — Z91041 Radiographic dye allergy status: Secondary | ICD-10-CM | POA: Diagnosis not present

## 2018-03-18 DIAGNOSIS — I482 Chronic atrial fibrillation, unspecified: Secondary | ICD-10-CM | POA: Diagnosis not present

## 2018-03-18 DIAGNOSIS — M25562 Pain in left knee: Secondary | ICD-10-CM | POA: Insufficient documentation

## 2018-03-18 DIAGNOSIS — K219 Gastro-esophageal reflux disease without esophagitis: Secondary | ICD-10-CM | POA: Diagnosis not present

## 2018-03-18 DIAGNOSIS — G629 Polyneuropathy, unspecified: Secondary | ICD-10-CM

## 2018-03-18 DIAGNOSIS — F329 Major depressive disorder, single episode, unspecified: Secondary | ICD-10-CM

## 2018-03-18 LAB — POCT GLYCOSYLATED HEMOGLOBIN (HGB A1C): HbA1c, POC (controlled diabetic range): 4.6 % (ref 0.0–7.0)

## 2018-03-18 MED ORDER — POTASSIUM CHLORIDE CRYS ER 20 MEQ PO TBCR: 20.0000 meq | EXTENDED_RELEASE_TABLET | Freq: Every day | ORAL | 3 refills | Status: DC

## 2018-03-18 MED ORDER — GABAPENTIN 300 MG PO CAPS: 300.0000 mg | ORAL_CAPSULE | Freq: Two times a day (BID) | ORAL | 3 refills | Status: DC

## 2018-03-18 NOTE — Progress Notes (Signed)
Subjective:  Patient ID: April [name], female    DOB: August 18, 1970  Age: 47 y.o. MRN: [id number]  CC: Hypertension   HPI April [name] is a 47 year old female with a history of morbid obesity, major depressive disorder, hypertension, COPD/ Asthma , obstructive sleep apnea (on CPAP) with multiple hospitalizations for asthma exacerbation/COPD , history of ARDS, influenza and health associated pneumonia requiring intubation, tracheostomy and ECMO at [facility] who presents for follow-up visit today. Last week she had an asthma exacerbation for which she was seen at [facility], ED where she was treated with magnesium, prednisone and nebulizers.  Chest x-ray revealed mild vascular congestion, minimal bibasilar atelectasis; she was subsequently discharged on prednisone.  She presents today feeling well, denying chest pain, dyspnea, wheezing and has been compliant with her asthma medications.  She is yet to make an appoint with with her pulmonologist, Dr. Annamaria Boots. Currently sees [facility] pain management for pain in her knees and lower extremities which she describes as starting from her mid thighs down her legs.  She complains of bilateral feet pain, paresthesia which she describes as her feet going to sleep with associated sharp pains.  Vitamin B12 from 12/2017 was normal, her TSH is normal.  She did have steroid-induced diabetes earlier in the year with an A1c of 7.2 but today her A1c is 4.6 and she is currently not on any medications.  Past Medical History:  Diagnosis Date  . Acanthosis nigricans   . Anxiety   . Arthritis    "knees" (04/28/2017)  . Asthma    Followed by Dr. Annamaria Boots (pulmonology); receives every other week omalizumab injections; has frequent exacerbations  . Chronic diastolic CHF (congestive heart failure) ([place]) 01/17/2017  . COPD (chronic obstructive pulmonary disease) ([place])    PFTs in 2002, FEV1/FVC 65, no post bronchodilater test done  . Depression   . GERD  (gastroesophageal reflux disease)   . Headache(784.0)    "q couple days" (04/28/2017)  . Helicobacter pylori (H. pylori) infection   . Hypertension, essential   . Insomnia   . Menorrhagia   . Morbid obesity ([place])   . OSA on CPAP    Sleep study 2008 - mild OSA, not enough events to titrate CPAP; wears CPAP now/pt on 04/28/2017  . Pneumonia X 1  . Seasonal allergies   . Shortness of breath   . Tobacco user     Past Surgical History:  Procedure Laterality Date  . CARDIOVERSION N/A 05/30/2017   Procedure: CARDIOVERSION;  Surgeon: Sanda Klein, MD;  Location: [facility];  Service: Cardiovascular;  Laterality: N/A;  . REDUCTION MAMMAPLASTY Bilateral 09/2011  . TUBAL LIGATION  1996   bilateral    Allergies  Allergen Reactions  . Contrast Media [Iodinated Diagnostic Agents] Itching    Ct contrast  . Oxycodone-Acetaminophen Itching      Outpatient Medications Prior to Visit  Medication Sig Dispense Refill  . apixaban (ELIQUIS) 5 MG TABS tablet Take 5 mg by mouth 2 (two) times daily.    . cetirizine (ZYRTEC) 10 MG tablet Take 10 mg by mouth daily.    . Ferrous Sulfate (IRON) 325 (65 Fe) MG TABS Take 1 tablet (325 mg total) by mouth every morning. (Patient taking differently: Take 1 tablet by mouth daily. ) 30 each 0  . fluticasone (FLONASE) 50 MCG/ACT nasal spray Place 1 spray into both nostrils daily.  2  . Fluticasone-Salmeterol (ADVAIR) 250-50 MCG/DOSE AEPB Inhale 1 puff into the lungs 2 (two) times  daily.    . furosemide (LASIX) 40 MG tablet Take 40 mg by mouth daily.    [name] HYDROcodone-acetaminophen (NORCO) 10-325 MG tablet Take 1 tablet by mouth 4 (four) times daily as needed for moderate pain.   0  . ipratropium-albuterol (DUONEB) 0.5-2.5 (3) MG/3ML SOLN Inhale 3 ml two times daily and every 2 hours as needed for shortness of breath or wheezing    . Melatonin 3 MG TABS Take 1 tablet by mouth at bedtime.    . montelukast (SINGULAIR) 10 MG tablet Take 10 mg by mouth at  bedtime.    [name] omeprazole (PRILOSEC) 20 MG capsule TAKE 1 CAPSULE BY MOUTH EVERY DAY (Patient taking differently: Take 20 mg by mouth daily. ) 30 capsule 5  . predniSONE (DELTASONE) 20 MG tablet 2 tabs po daily x 4 days 8 tablet 0  . PROAIR HFA 108 (90 Base) MCG/ACT inhaler Inhale 2 puffs into the lungs every 4 (four) hours as needed for wheezing.   0  . zolpidem (AMBIEN) 5 MG tablet TAKE 1 TABLET(5 MG) BY MOUTH AT BEDTIME 30 tablet 2  . potassium chloride SA (K-DUR,KLOR-CON) 20 MEQ tablet Take 1 tablet (20 mEq total) by mouth daily. 30 tablet 0  . sertraline (ZOLOFT) 100 MG tablet Take 100 mg by mouth daily.     No facility-administered medications prior to visit.     ROS Review of Systems  Constitutional: Negative for activity change, appetite change and fatigue.  HENT: Negative for congestion, sinus pressure and sore throat.   Eyes: Negative for visual disturbance.  Respiratory: Negative for cough, chest tightness, shortness of breath and wheezing.   Cardiovascular: Negative for chest pain and palpitations.  Gastrointestinal: Negative for abdominal distention, abdominal pain and constipation.  Endocrine: Negative for polydipsia.  Genitourinary: Negative for dysuria and frequency.  Musculoskeletal:       See hpi  Skin: Negative for rash.  Neurological: Negative for tremors, light-headedness and numbness.  Hematological: Does not bruise/bleed easily.  Psychiatric/Behavioral: Negative for agitation and behavioral problems.    Objective:  BP 110/74   Pulse 80   Temp 98 F (36.7 C) (Oral)   Ht 5\' 6"  (1.676 m)   Wt 297 lb (134.7 kg)   SpO2 100%   BMI 47.94 kg/m   BP/Weight 03/18/2018 03/12/2018 08/05/8117  Systolic BP 147 829 562  Diastolic BP 74 69 82  Wt. (Lbs) 297 - 287  BMI 47.94 - 46.32  Some encounter information is confidential and restricted. Go to Review Flowsheets activity to see all data.     Physical Exam  Constitutional: She is oriented to person, place, and  time. She appears well-developed and well-nourished.  Morbidly obese  Cardiovascular: Normal rate, normal heart sounds and intact distal pulses.  No murmur heard. Pulmonary/Chest: Effort normal and breath sounds normal. She has no wheezes. She has no rales. She exhibits no tenderness.  Abdominal: Soft. Bowel sounds are normal. She exhibits no distension and no mass. There is no tenderness.  Musculoskeletal: Normal range of motion.  Neurological: She is alert and oriented to person, place, and time.  Skin: Skin is warm and dry.  Psychiatric: She has a normal mood and affect.    Lab Results  Component Value Date   HGBA1C 4.6 03/18/2018    Assessment & Plan:   1. Morbid obesity with body mass index of 50.0-59.9 in adult [facility]) She is working on weightloss Reduce portion sizes, increase physical activity  2. Type 2 diabetes mellitus without  complication, without long-term current use of insulin (HCC) Diet controlled with A1c of 4.6 - POCT glycosylated hemoglobin (Hb A1C)  3. Anxiety and depression Stable  4. Primary insomnia Controlled on Ambien  5. Chronic atrial fibrillation On anticoagulation with Eliquis  6. Moderate persistent asthma with exacerbation Last exacerbation was 1 week ago Symptoms also seem to be triggered by anxiety Improved Continue ICS, MDI, Singulair Keep appointment with pulmonary  7. Neuropathy Commenced on gabapentin  8. Chronic pain of both knees Currently followed by pain management Weight loss will be beneficial   Meds ordered this encounter  Medications  . gabapentin (NEURONTIN) 300 MG capsule    Sig: Take 1 capsule (300 mg total) by mouth 2 (two) times daily.    Dispense:  60 capsule    Refill:  3  . potassium chloride SA (K-DUR,KLOR-CON) 20 MEQ tablet    Sig: Take 1 tablet (20 mEq total) by mouth daily.    Dispense:  30 tablet    Refill:  3    Follow-up: Return in about 3 months (around 06/18/2018) for follow up of chronic medical  conditions.   [name] MD

## 2018-03-18 NOTE — Patient Instructions (Signed)
Neuropathic Pain Neuropathic pain is pain caused by damage to the nerves that are responsible for certain sensations in your body (sensory nerves). The pain can be caused by damage to:  The sensory nerves that send signals to your spinal cord and brain (peripheral nervous system).  The sensory nerves in your brain or spinal cord (central nervous system).  Neuropathic pain can make you more sensitive to pain. What would be a minor sensation for most people may feel very painful if you have neuropathic pain. This is usually a long-term condition that can be difficult to treat. The type of pain can differ from person to person. It may start suddenly (acute), or it may develop slowly and last for a long time (chronic). Neuropathic pain may come and go as damaged nerves heal or may stay at the same level for years. It often causes emotional distress, loss of sleep, and a lower quality of life. What are the causes? The most common cause of damage to a sensory nerve is diabetes. Many other diseases and conditions can also cause neuropathic pain. Causes of neuropathic pain can be classified as:  Toxic. Many drugs and chemicals can cause toxic damage. The most common cause of toxic neuropathic pain is damage from drug treatment for cancer (chemotherapy).  Metabolic. This type of pain can happen when a disease causes imbalances that damage nerves. Diabetes is the most common of these diseases. Vitamin B deficiency caused by long-term alcohol abuse is another common cause.  Traumatic. Any injury that cuts, crushes, or stretches a nerve can cause damage and pain. A common example is feeling pain after losing an arm or leg (phantom limb pain).  Compression-related. If a sensory nerve gets trapped or compressed for a long period of time, the blood supply to the nerve can be cut off.  Vascular. Many blood vessel diseases can cause neuropathic pain by decreasing blood supply and oxygen to nerves.  Autoimmune.  This type of pain results from diseases in which the body's defense system mistakenly attacks sensory nerves. Examples of autoimmune diseases that can cause neuropathic pain include lupus and multiple sclerosis.  Infectious. Many types of viral infections can damage sensory nerves and cause pain. Shingles infection is a common cause of this type of pain.  Inherited. Neuropathic pain can be a symptom of many diseases that are passed down through families (genetic).  What are the signs or symptoms? The main symptom is pain. Neuropathic pain is often described as:  Burning.  Shock-like.  Stinging.  Hot or cold.  Itching.  How is this diagnosed? No single test can diagnose neuropathic pain. Your health care provider will do a physical exam and ask you about your pain. You may use a pain scale to describe how bad your pain is. You may also have tests to see if you have a high sensitivity to pain and to help find the cause and location of any sensory nerve damage. These tests may include:  Imaging studies, such as: ? X-rays. ? CT scan. ? MRI.  Nerve conduction studies to test how well nerve signals travel through your sensory nerves (electrodiagnostic testing).  Stimulating your sensory nerves through electrodes on your skin and measuring the response in your spinal cord and brain (somatosensory evoked potentials).  How is this treated? Treatment for neuropathic pain may change over time. You may need to try different treatment options or a combination of treatments. Some options include:  Over-the-counter pain relievers.  Prescription medicines. Some medicines   used to treat other conditions may also help neuropathic pain. These include medicines to: ? Control seizures (anticonvulsants). ? Relieve depression (antidepressants).  Prescription-strength pain relievers (narcotics). These are usually used when other pain relievers do not help.  Transcutaneous nerve stimulation (TENS).  This uses electrical currents to block painful nerve signals. The treatment is painless.  Topical and local anesthetics. These are medicines that numb the nerves. They can be injected as a nerve block or applied to the skin.  Alternative treatments, such as: ? Acupuncture. ? Meditation. ? Massage. ? Physical therapy. ? Pain management programs. ? Counseling.  Follow these instructions at home:  Learn as much as you can about your condition.  Take medicines only as directed by your health care provider.  Work closely with all your health care providers to find what works best for you.  Have a good support system at home.  Consider joining a chronic pain support group. Contact a health care provider if:  Your pain treatments are not helping.  You are having side effects from your medicines.  You are struggling with fatigue, mood changes, depression, or anxiety. This information is not intended to replace advice given to you by your health care provider. Make sure you discuss any questions you have with your health care provider. Document Released: 01/11/2004 Document Revised: 11/03/2015 Document Reviewed: 09/23/2013 Elsevier Interactive Patient Education  2018 Elsevier Inc.  

## 2018-03-18 NOTE — Progress Notes (Signed)
Patient is having pain in her legs and feet.

## 2018-03-20 ENCOUNTER — Encounter: Payer: Self-pay | Admitting: Family Medicine

## 2018-03-23 ENCOUNTER — Ambulatory Visit: Payer: Medicaid Other | Admitting: Physical Therapy

## 2018-03-23 DIAGNOSIS — M25562 Pain in left knee: Secondary | ICD-10-CM | POA: Diagnosis not present

## 2018-03-23 DIAGNOSIS — M25674 Stiffness of right foot, not elsewhere classified: Secondary | ICD-10-CM

## 2018-03-23 DIAGNOSIS — M25561 Pain in right knee: Secondary | ICD-10-CM

## 2018-03-23 DIAGNOSIS — G8929 Other chronic pain: Secondary | ICD-10-CM

## 2018-03-23 DIAGNOSIS — M25675 Stiffness of left foot, not elsewhere classified: Secondary | ICD-10-CM

## 2018-03-23 DIAGNOSIS — M6281 Muscle weakness (generalized): Secondary | ICD-10-CM

## 2018-03-23 NOTE — Therapy (Addendum)
[place], [place], [address] Phone: [phone]   Fax:  [phone]  Physical Therapy Treatment/Discharge   Patient Details  Name: April [name] MRN: [id number] Date of Birth: 1971/02/25 Referring Provider (PT): Elenore Paddy    Encounter Date: 03/23/2018  PT End of Session - 03/23/18 1453    Visit Number  2    Number of Visits  4    Date for PT Re-Evaluation  04/08/18    Authorization Type  Medicaid     PT Start Time  0277    PT Stop Time  1500    PT Time Calculation (min)  48 min    Activity Tolerance  Patient limited by pain    Behavior During Therapy  [facility] for tasks assessed/performed       Past Medical History:  Diagnosis Date  . Acanthosis nigricans   . Anxiety   . Arthritis    "knees" (04/28/2017)  . Asthma    Followed by Dr. Annamaria Boots (pulmonology); receives every other week omalizumab injections; has frequent exacerbations  . Chronic diastolic CHF (congestive heart failure) ([place]) 01/17/2017  . COPD (chronic obstructive pulmonary disease) ([place])    PFTs in 2002, FEV1/FVC 65, no post bronchodilater test done  . Depression   . GERD (gastroesophageal reflux disease)   . Headache(784.0)    "q couple days" (04/28/2017)  . Helicobacter pylori (H. pylori) infection   . Hypertension, essential   . Insomnia   . Menorrhagia   . Morbid obesity ([place])   . OSA on CPAP    Sleep study 2008 - mild OSA, not enough events to titrate CPAP; wears CPAP now/pt on 04/28/2017  . Pneumonia X 1  . Seasonal allergies   . Shortness of breath   . Tobacco user     Past Surgical History:  Procedure Laterality Date  . CARDIOVERSION N/A 05/30/2017   Procedure: CARDIOVERSION;  Surgeon: Sanda Klein, MD;  Location: [facility];  Service: Cardiovascular;  Laterality: N/A;  . REDUCTION MAMMAPLASTY Bilateral 09/2011  . TUBAL LIGATION  1996   bilateral    There were no vitals filed for this visit.  Subjective  Assessment - 03/23/18 1426    Subjective  Pt relays her legs are still really bohering her    Currently in Pain?  Yes    Pain Score  8     Pain Location  Leg    Pain Orientation  Right;Left    Pain Descriptors / Indicators  Throbbing    Pain Type  Chronic pain         [facility] PT Assessment - 03/23/18 0001      Standardized Balance Assessment   Standardized Balance Assessment  Timed Up and Go Test;Five Times Sit to Stand    Five times sit to stand comments   23.87 with use of UEs      Timed Up and Go Test   Normal TUG (seconds)  20.47                   [facility] Adult PT Treatment/Exercise - 03/23/18 0001      Exercises   Exercises  Knee/Hip      Knee/Hip Exercises: Stretches   Passive Hamstring Stretch  Both;2 reps;30 seconds    Passive Hamstring Stretch Limitations  seated    Other Knee/Hip Stretches  seated calf stretch 2x30sec ea      Knee/Hip Exercises: Seated   Long Arc Quad  Both;2 sets;10 reps  Hamstring Curl  Right;Left;1 set;15 reps    Hamstring Limitations  yellow    Sit to Sand  --   5TSTS see above for time     Knee/Hip Exercises: Supine   Quad Sets  Both;15 reps    Short Arc Quad Sets  Both;2 sets;10 reps    Hip Adduction Isometric  15 reps    Other Supine Knee/Hip Exercises  clamshells x15 green t-band    Other Supine Knee/Hip Exercises  supine ankle 4-way with yellow Tband X 20 all planes bilat      Modalities   Modalities  Moist Heat      Moist Heat Therapy   Number Minutes Moist Heat  8 Minutes    Moist Heat Location  Knee   bilat, legs elevated on big bolster              PT Short Term Goals - 03/11/18 1735      PT SHORT TERM GOAL #1   Title  Increased bilateral knee flexor and extensor strength to 4-/5 to improve functional mobility and gait     Baseline  gross 3+/ 5     Time  4    Period  Weeks    Status  New    Target Date  04/08/18      PT SHORT TERM GOAL #2   Title  Pt will improve bilateral ankle dorsiflexion  by 5 degrees     Time  4    Period  Weeks    Status  New    Target Date  04/08/18      PT SHORT TERM GOAL #3   Title  Pt will be able to ambulate 5 minutes without increased pain in legs or feet     Time  4    Period  Weeks    Status  New    Target Date  04/08/18               Plan - 03/23/18 1454    Clinical Impression Statement  Session focused on LE stretching and strengthening today to tolerance. Five times sit to stand and TUG where tested today which indicate risk for falling and decreased muscular endurance, see above for scores. PT will continue to progress strengthening, gait, and ambulation tolerance as able.     Rehab Potential  Fair    Clinical Impairments Affecting Rehab Potential  Morbid obesity,     PT Frequency  1x / week    PT Duration  4 weeks    PT Treatment/Interventions  ADLs/Self Care Home Management;Cryotherapy;Electrical Stimulation;Iontophoresis '4mg'$ /ml Dexamethasone;Moist Heat;Traction;Ultrasound;Gait training;DME Instruction;Stair training;Functional mobility training;Therapeutic activities;Therapeutic exercise;Balance training;Neuromuscular re-education;Patient/family education;Manual techniques;Passive range of motion;Taping;Vasopneumatic Device;Joint Manipulations    PT Next Visit Plan  consider soft tissue mobilization to quads and/or hamstring, passive stretching of ankle and hip, quad sets, SAQ, clamshells, hamstring curls, towel scrunches, 4 way ankle t-band, desensitization techniques, taping, possible further assessement of vascularity and/or upper/lower motor neuron invovelement     PT Home Exercise Plan  Gastroc stretch with strap, clamshells with yellow t-band,  AROM of ankle dorsiflexion    Consulted and Agree with Plan of Care  Patient       Patient will benefit from skilled therapeutic intervention in order to improve the following deficits and impairments:  Abnormal gait, Decreased skin integrity, Pain, Cardiopulmonary status limiting  activity, Decreased mobility, Postural dysfunction, Decreased activity tolerance, Decreased endurance, Decreased range of motion, Decreased strength, Hypomobility, Decreased balance, Difficulty walking, Obesity,  Impaired flexibility  Visit Diagnosis: Chronic pain of left knee  Chronic pain of right knee  Muscle weakness (generalized)  Stiffness of right foot, not elsewhere classified  Stiffness of left foot, not elsewhere classified  PHYSICAL THERAPY DISCHARGE SUMMARY  Visits from Start of Care: 2  Current functional level related to goals / functional outcomes: Unknown patient did not come for follow up    Remaining deficits: Unknown    Education / Equipment: Unknown   Plan: Patient agrees to discharge.  Patient goals were not met. Patient is being discharged due to meeting the stated rehab goals.  ?????       Problem List Patient Active Problem List   Diagnosis Date Noted  . Urinary incontinence 11/17/2017  . Nausea with vomiting 11/06/2017  . Hypomagnesemia 10/27/2017  . Dysphagia 10/27/2017  . Physical deconditioning 10/25/2017  . Acute maxillary sinusitis 10/24/2017  . Emesis, persistent 10/08/2017  . Volume depletion 10/08/2017  . Pleuritic chest pain 10/08/2017  . SOB (shortness of breath) 10/08/2017  . Debility 09/22/2017  . HCAP (healthcare-associated pneumonia) 08/06/2017  . Chronic atrial fibrillation 07/29/2017  . COPD with acute exacerbation ([place]) 02/10/2017  . Dyspnea 02/04/2017  . Chronic diastolic CHF (congestive heart failure) ([place]) 01/17/2017  . Metabolic syndrome 82/99/3716  . Moderate persistent asthma with exacerbation 10/19/2016  . Normocytic anemia 10/02/2016  . Elevated hemoglobin A1c 05/05/2016  . GERD (gastroesophageal reflux disease) 08/30/2015  . Anxiety and depression 08/30/2015  . Generalized anxiety disorder 08/30/2015  . Seasonal allergic rhinitis 08/29/2013  . Leukocytosis 10/21/2012  . Tobacco abuse in remission 10/07/2012   . COPD mixed type ([place]) 05/07/2012  . Knee pain, bilateral 04/25/2011  . Primary insomnia 03/14/2011  . OSA on CPAP 12/19/2010  . Obstructive sleep apnea 12/19/2010  . Hypokalemia 08/13/2010  . Cervical back pain with evidence of disc disease 04/08/2008  . Essential hypertension 07/31/2006  . Morbid obesity with body mass index of 50.0-59.9 in adult ([place]) 06/17/2006  . Major depressive disorder, recurrent episode ([place]) 04/10/2006   Carolyne Littles PT DPT  07/08/2018  Debbe Odea, PT,DPT 03/23/2018, 2:57 PM  [facility] [address] [place], [place], [address] Phone: [phone]   Fax:  [phone]  Name: April [name] MRN: [id number] Date of Birth: 1970-08-21

## 2018-03-30 ENCOUNTER — Ambulatory Visit: Payer: Medicaid Other | Admitting: Physical Therapy

## 2018-03-30 ENCOUNTER — Telehealth: Payer: Self-pay

## 2018-03-30 NOTE — Telephone Encounter (Signed)
Call received from the patient stating that she was informed that she is loosing her PCS.  When asked about her medicaid status, she said that she has not received anything in the mail about her medicaid.   This CM informed her that as per Kim Tracks, her medicaid is inactive.  Instructed her to contact her DSS caseworker to inquire about the status of her medicaid. Provided her with the phone # for DSS.

## 2018-04-06 ENCOUNTER — Ambulatory Visit: Payer: Medicaid Other | Attending: Family Medicine | Admitting: Physical Therapy

## 2018-04-14 IMAGING — CT CT ABD-PELV W/ CM
2 of 5 series · 16 of 46 positions shown, 18 images · IV contrast (APPLIED)
Comparison: 10/27/2016 and prior CTs

CLINICAL DATA: 46-year-old female with abdominal and pelvic pain
for 1 week.

EXAM:
CT ABDOMEN AND PELVIS WITH CONTRAST
TECHNIQUE: Multidetector CT imaging of the abdomen and pelvis was performed
using the standard protocol following bolus administration of
intravenous contrast.
CONTRAST:  100mL R16WC4-6NN IOPAMIDOL ([id number]) INJECTION 61%

[Series 3: abd/ pelvis 5.0 i30f 2 · axial · 0.98mm/px · z∈[+836,+1232]mm · 13 of 89 slices shown, 15 images]
[im 5/89  soft-tissue]
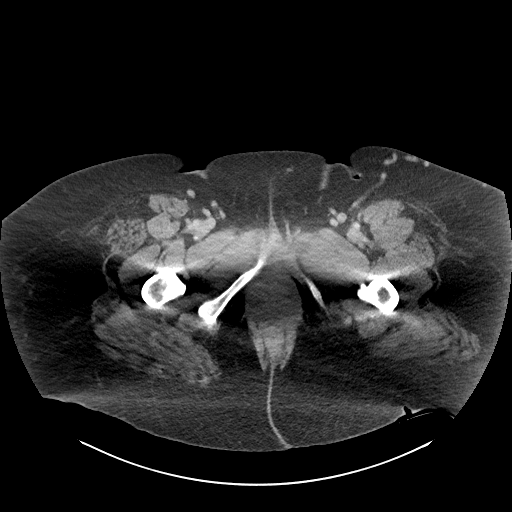
[im 5/89  bone]
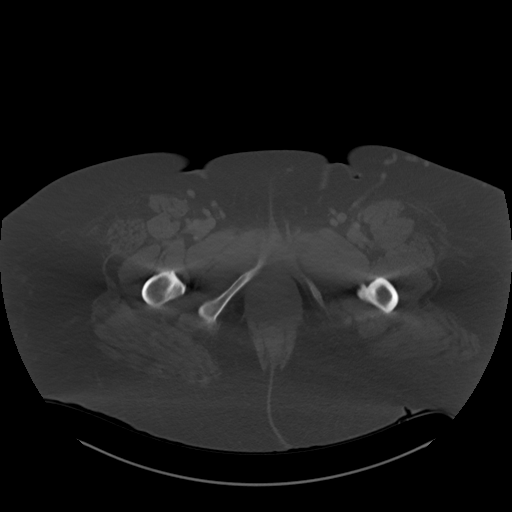
[im 10/89  soft-tissue]
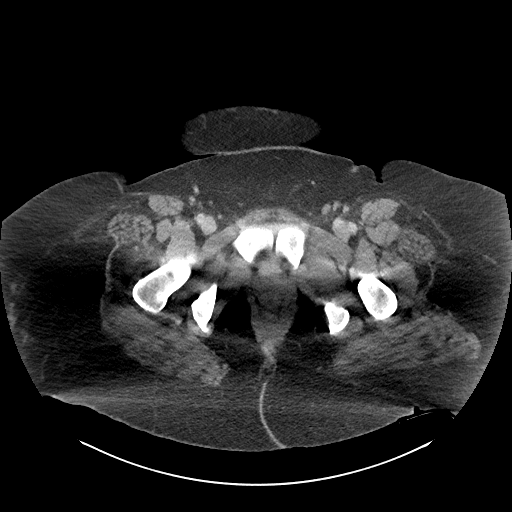
[im 20/89  soft-tissue]
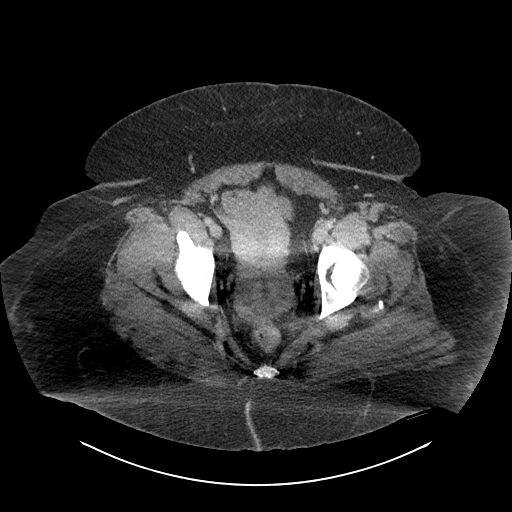
[im 25/89  soft-tissue]
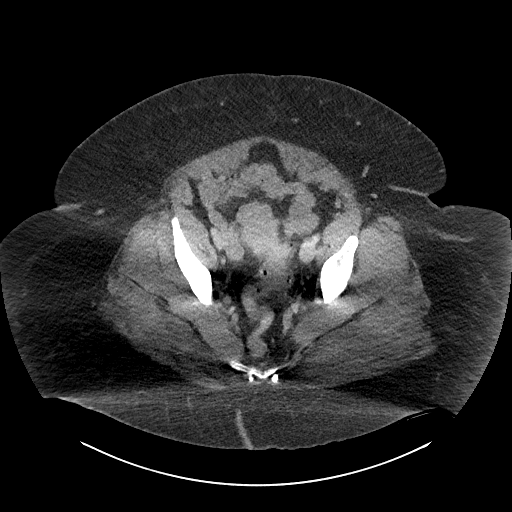
[im 30/89  soft-tissue]
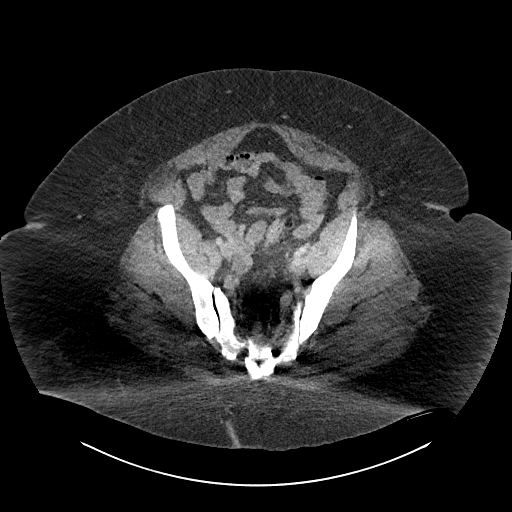
[im 40/89  soft-tissue]
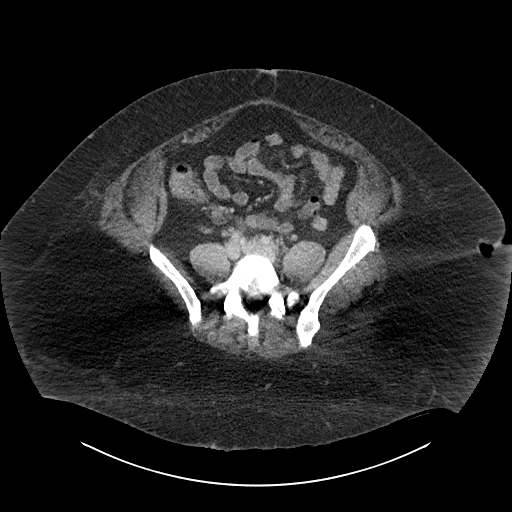
[im 45/89  soft-tissue]
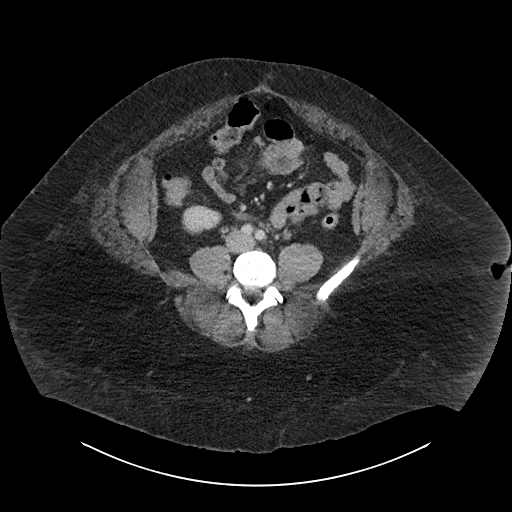
[im 49/89  soft-tissue]
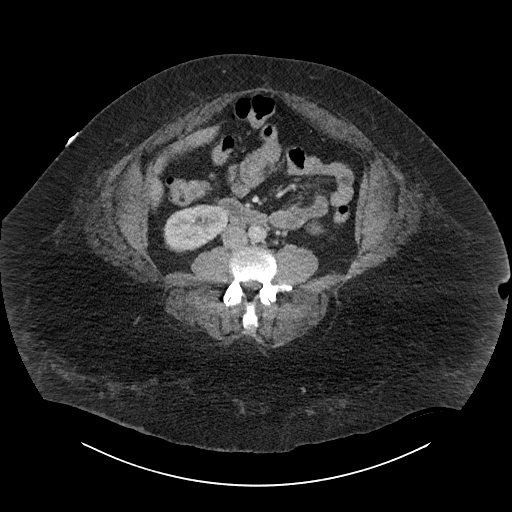
[im 59/89  soft-tissue]
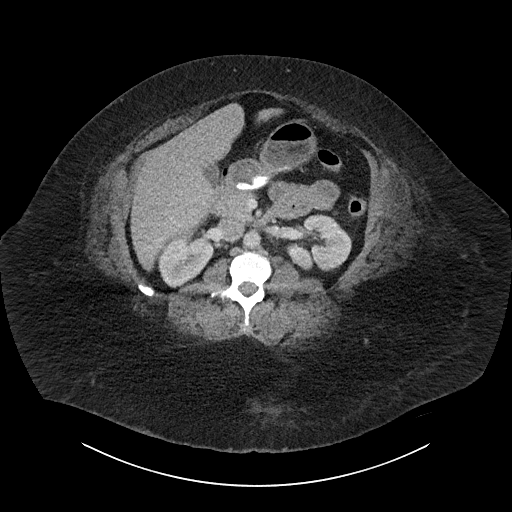
[im 59/89  bone]
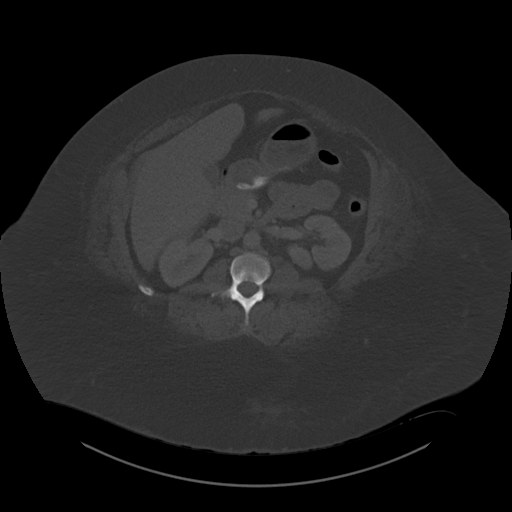
[im 64/89  soft-tissue]
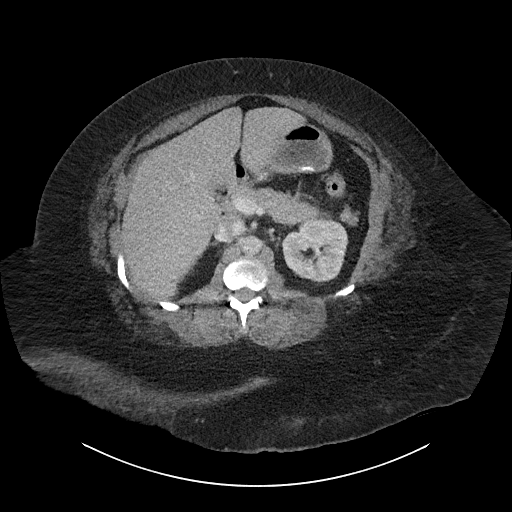
[im 69/89  soft-tissue]
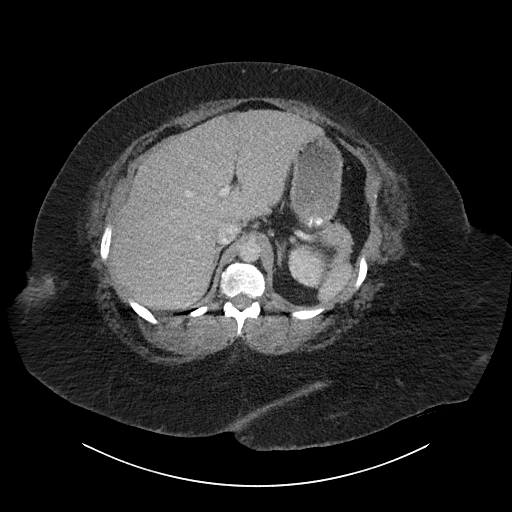
[im 79/89  soft-tissue]
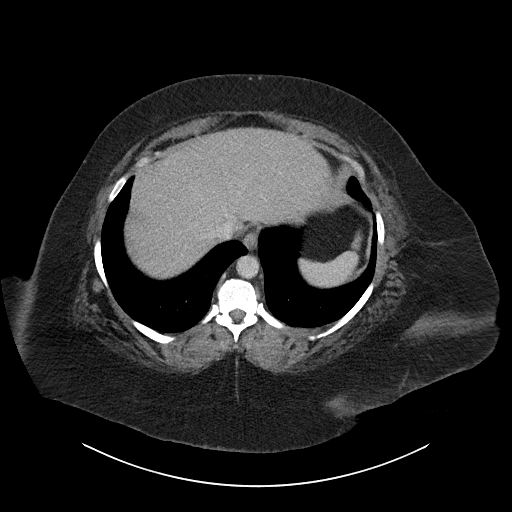
[im 84/89  soft-tissue]
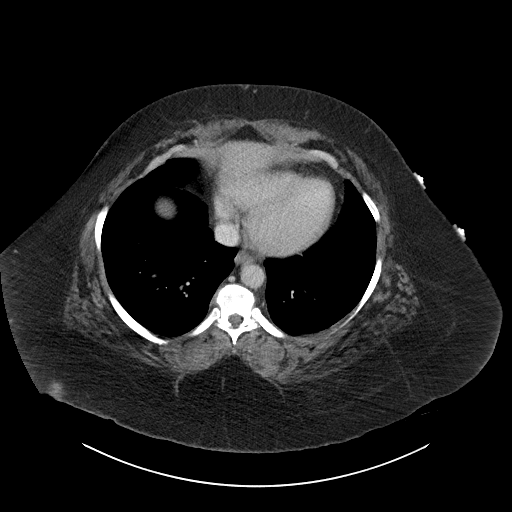

[Series 6: coronal soft tissue · coronal · 0.87mm/px · 3 of 133 slices shown]
[im 45/133  soft-tissue]
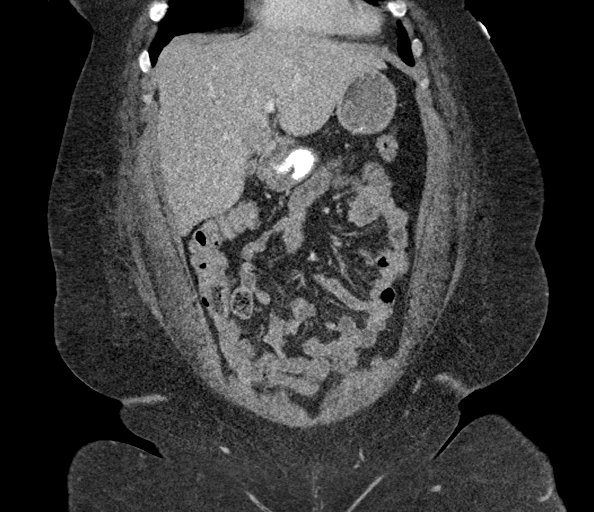
[im 59/133  soft-tissue]
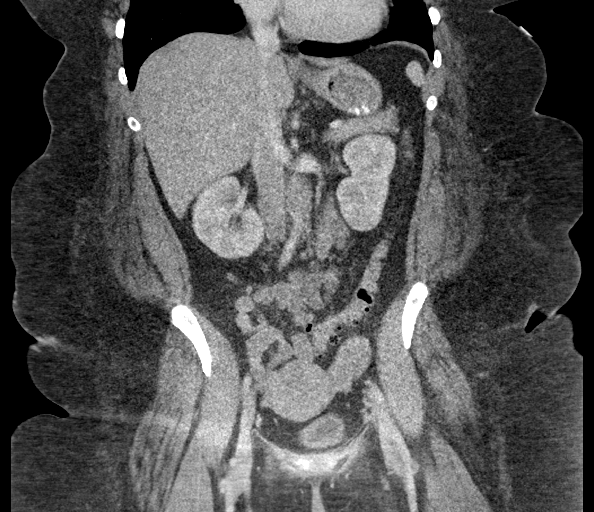
[im 74/133  soft-tissue]
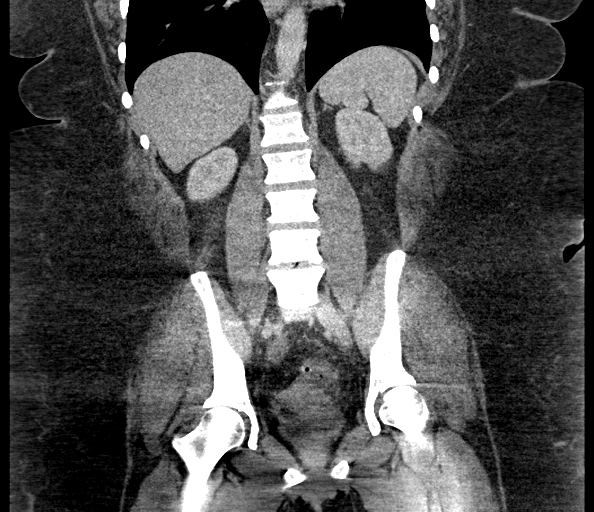

[16 of 46 positions shown; findings below may reference images not displayed]

FINDINGS: Lower chest: No acute abnormality

Hepatobiliary: The liver and gallbladder are unremarkable. No
biliary dilatation.

Pancreas: Unremarkable

Spleen: Unremarkable

Adrenals/Urinary Tract: The kidneys, adrenal glands and bladder are
unremarkable.

Stomach/Bowel: Equivocal stranding adjacent to the sigmoid colon is
noted (images 60-64) but may be secondary to streak artifact in this
area. Stomach is within normal limits. Appendix appears normal. No
evidence of bowel wall thickening, distention, or other inflammatory
changes.

Vascular/Lymphatic: No significant vascular findings are present. No
enlarged abdominal or pelvic lymph nodes.

Reproductive: Uterus and bilateral adnexa are unremarkable.

Other: No abdominal wall hernia or abnormality. No abdominopelvic
ascites.

Musculoskeletal: No acute or significant osseous findings.
IMPRESSION: 1. Equivocal stranding adjacent to the sigmoid colon which could be
related to mild diverticulitis versus streak artifact. No evidence
of bowel obstruction, ascites, abscess or pneumoperitoneum.
2. No other acute abnormalities identified.

## 2018-04-14 IMAGING — CR DG ABDOMEN ACUTE W/ 1V CHEST
6 series · 6 of 6 positions shown · non-contrast
Comparison: 02/14/2017, CT 10/27/2016

CLINICAL DATA: Abdominal pain for 1 week

EXAM:
DG ABDOMEN ACUTE W/ 1V CHEST

[chest pa]
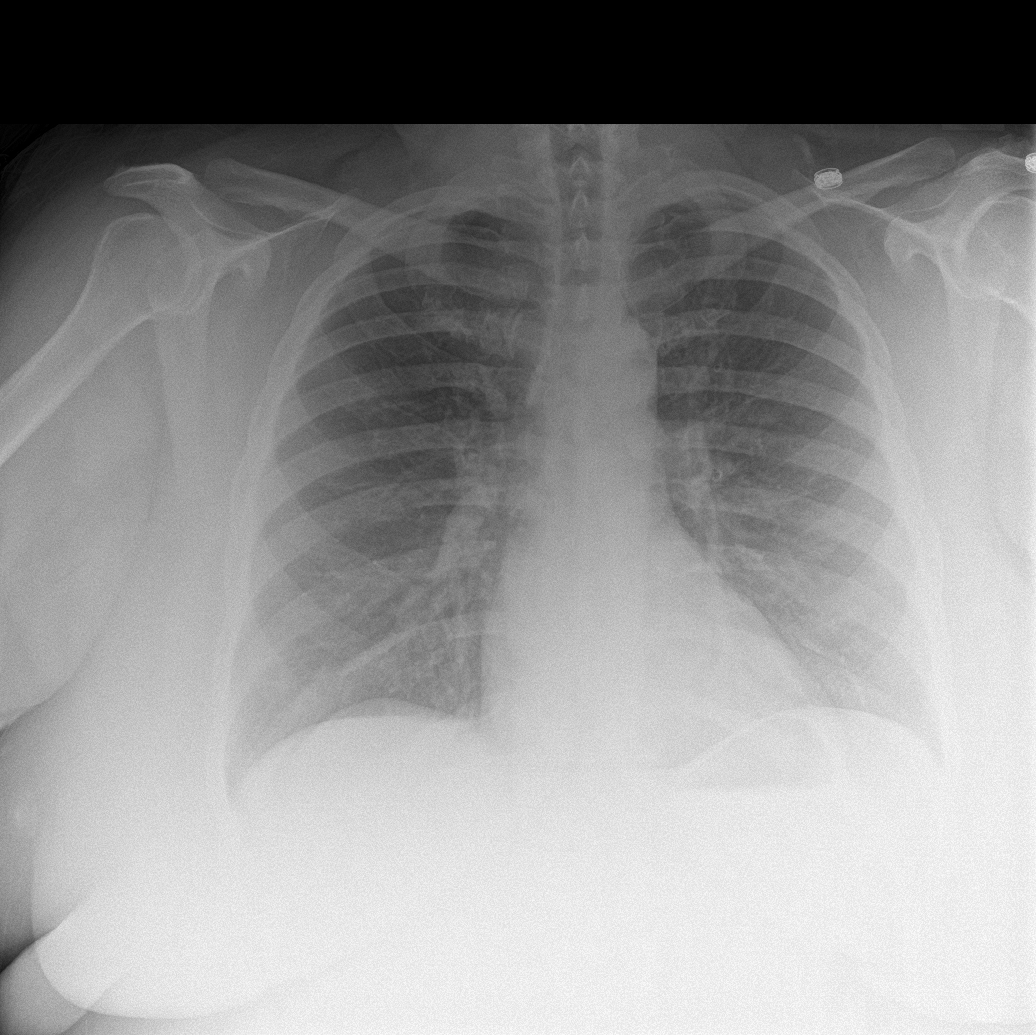

[abdomen erect]
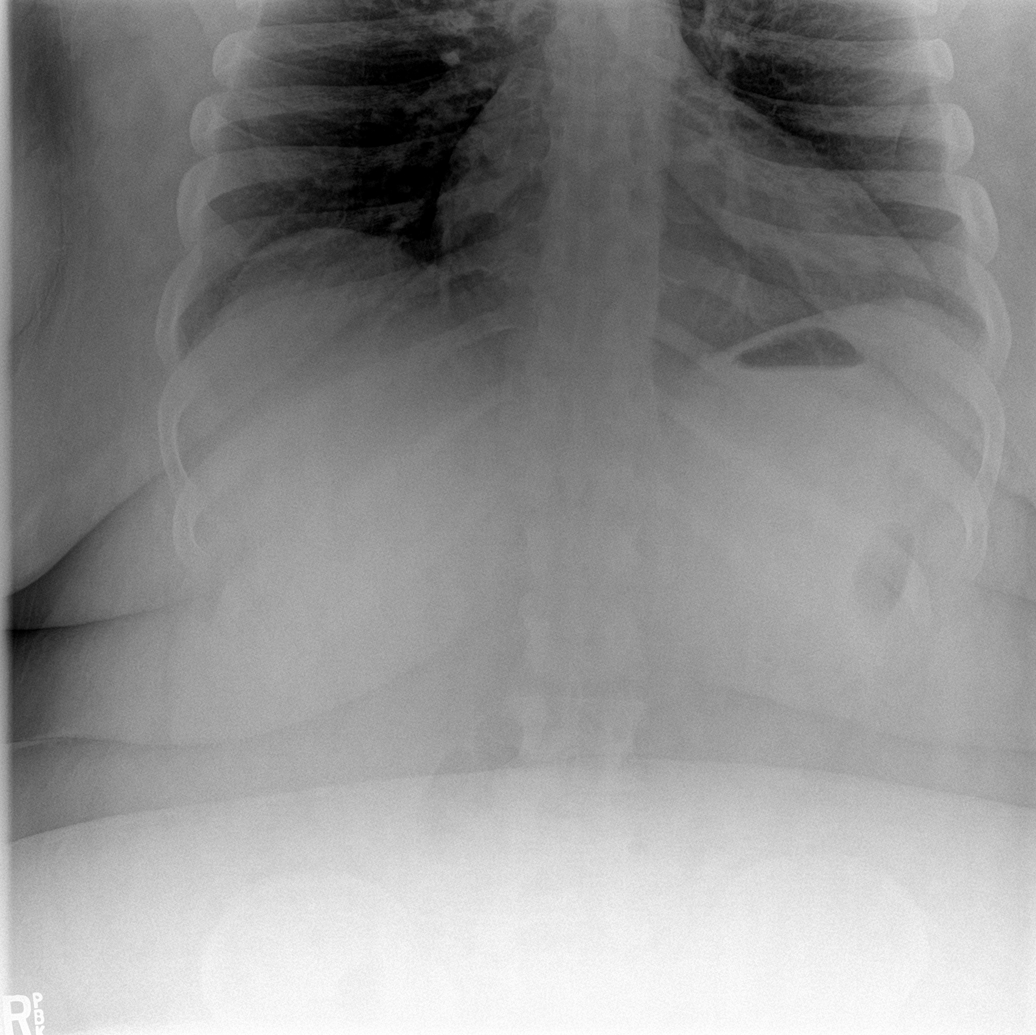

[abdomen supine (1 of 4)]
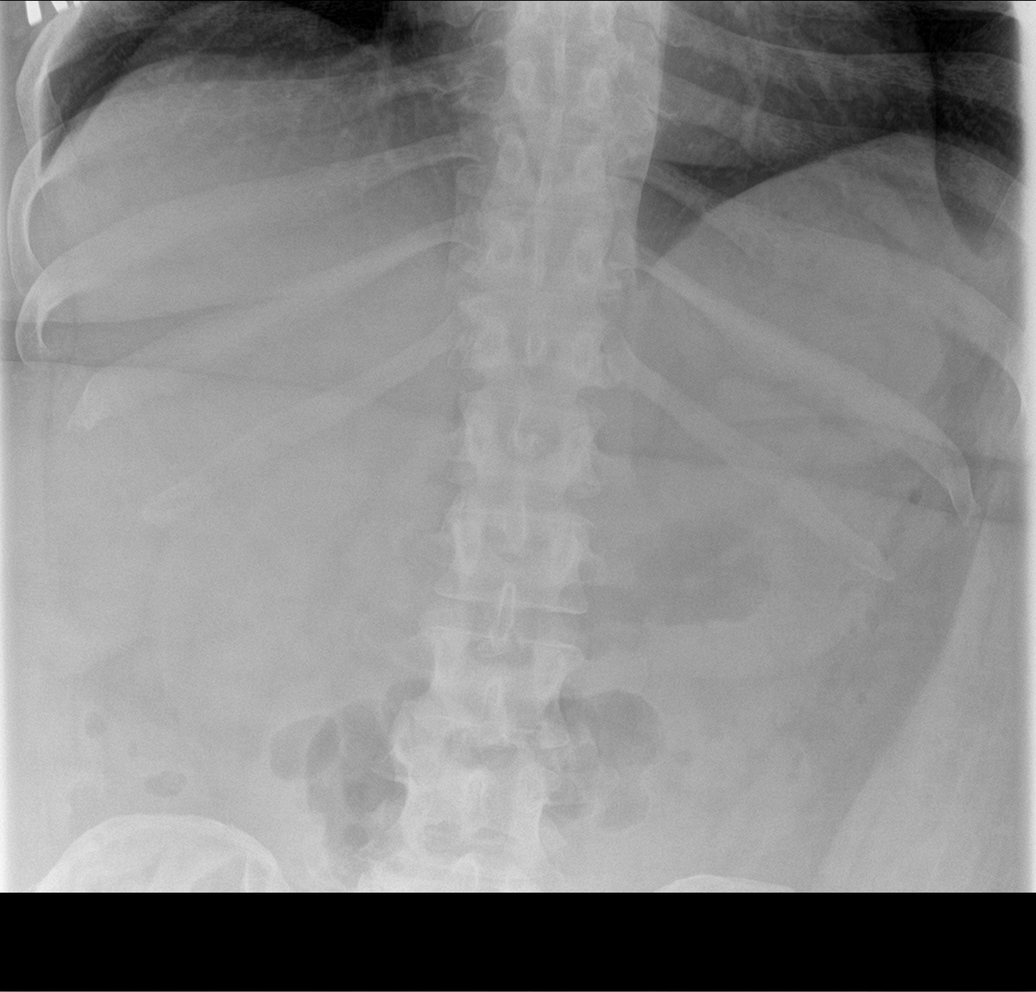

[abdomen supine (2 of 4)]
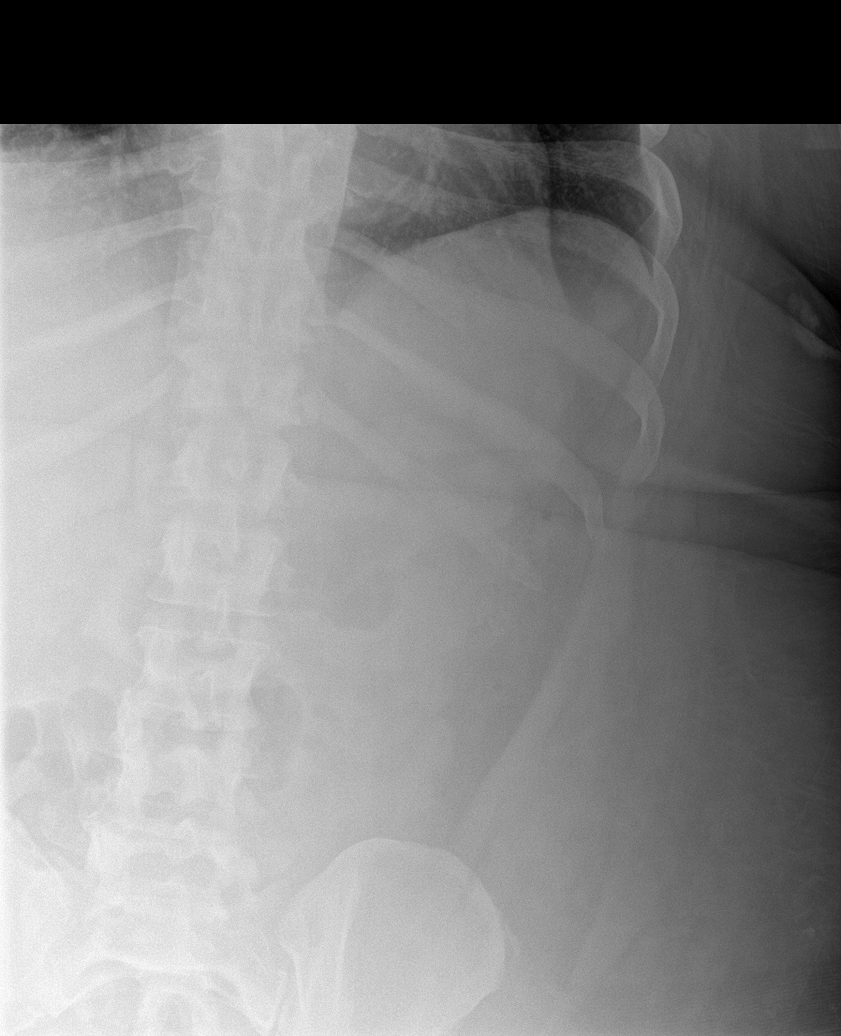

[abdomen supine (3 of 4)]
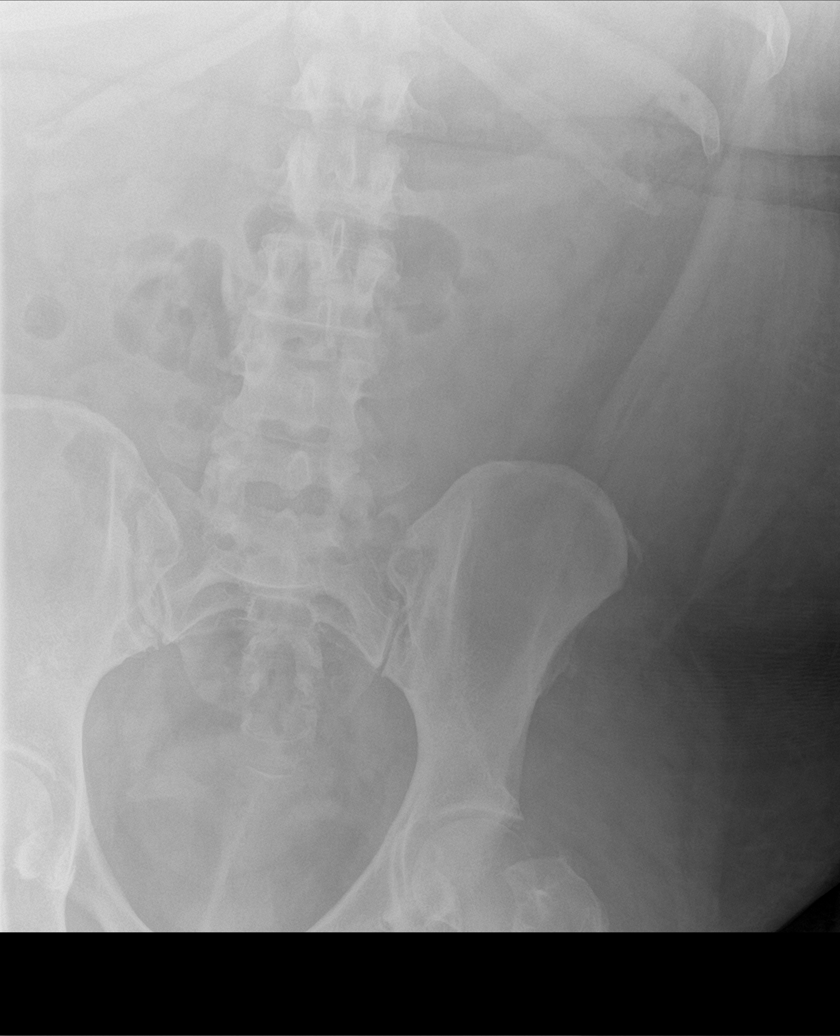

[abdomen supine (4 of 4)]
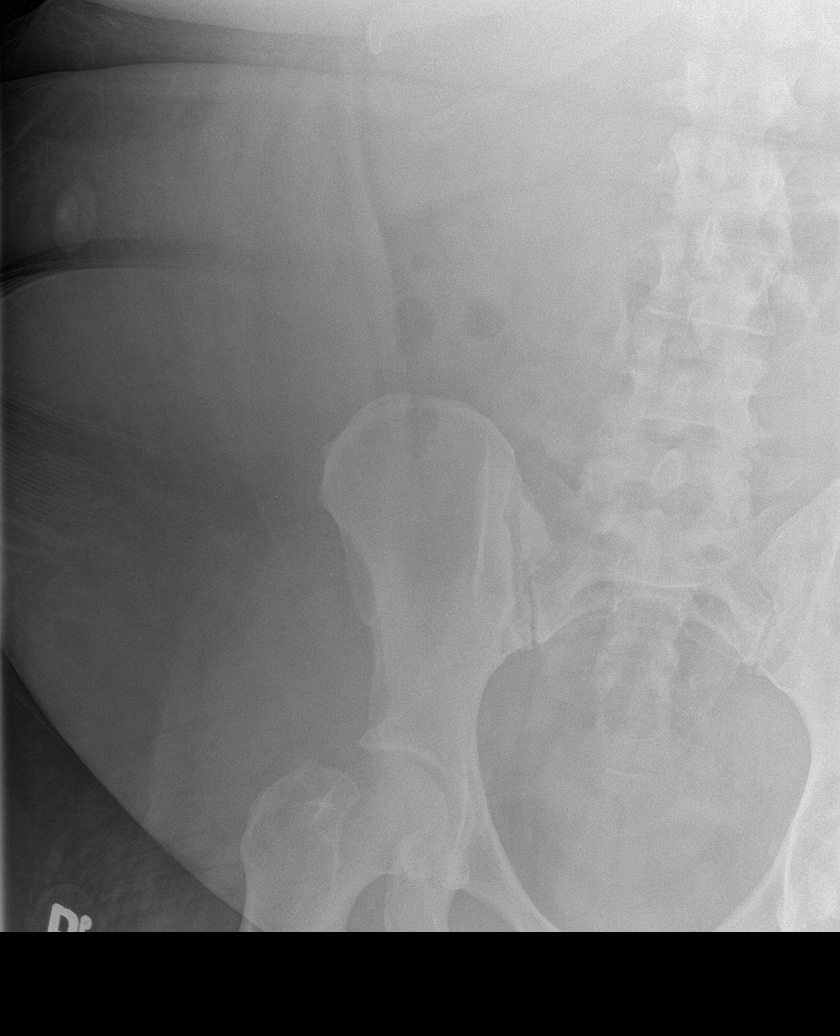

[6 of 6 positions shown; findings below may reference images not displayed]

FINDINGS: Single-view chest demonstrates no consolidation or effusion. Normal
heart size. No pneumothorax.

Supine and upright views of the abdomen demonstrate no free air.
Nonobstructed gas pattern
IMPRESSION: Negative abdominal radiographs.  No acute cardiopulmonary disease.

## 2018-04-20 ENCOUNTER — Telehealth: Payer: Self-pay | Admitting: Family Medicine

## 2018-04-20 NOTE — Telephone Encounter (Signed)
[facility] with [facility] called to confirm that the medicaid form for the bed recert was received. Please follow up.

## 2018-04-26 ENCOUNTER — Emergency Department (HOSPITAL_COMMUNITY)
Admission: EM | Admit: 2018-04-26 | Discharge: 2018-04-26 | Discharge status: Against Medical Advice | Payer: Self-pay | Attending: Emergency Medicine | Admitting: Emergency Medicine

## 2018-04-26 ENCOUNTER — Emergency Department (HOSPITAL_COMMUNITY): Payer: Self-pay

## 2018-04-26 DIAGNOSIS — Z5321 Procedure and treatment not carried out due to patient leaving prior to being seen by health care provider: Secondary | ICD-10-CM | POA: Insufficient documentation

## 2018-04-26 LAB — I-STAT TROPONIN, ED: Troponin i, poc: 0 ng/mL (ref 0.00–0.08)

## 2018-04-26 LAB — CBC
HEMATOCRIT: 39.9 % (ref 36.0–46.0)
Hemoglobin: 12 g/dL (ref 12.0–15.0)
MCH: 24.5 pg — AB (ref 26.0–34.0)
MCHC: 30.1 g/dL (ref 30.0–36.0)
MCV: 81.4 fL (ref 80.0–100.0)
Platelets: 567 10*3/uL — ABNORMAL HIGH (ref 150–400)
RBC: 4.9 MIL/uL (ref 3.87–5.11)
RDW: 18.9 % — ABNORMAL HIGH (ref 11.5–15.5)
WBC: 14.8 10*3/uL — ABNORMAL HIGH (ref 4.0–10.5)
nRBC: 0 % (ref 0.0–0.2)

## 2018-04-26 LAB — BASIC METABOLIC PANEL
Anion gap: 12 (ref 5–15)
BUN: 5 mg/dL — AB (ref 6–20)
CHLORIDE: 101 mmol/L (ref 98–111)
CO2: 22 mmol/L (ref 22–32)
Calcium: 8.6 mg/dL — ABNORMAL LOW (ref 8.9–10.3)
Creatinine, Ser: 0.6 mg/dL (ref 0.44–1.00)
GFR calc Af Amer: >60 mL/min (ref >60)
GFR calc non Af Amer: >60 mL/min (ref >60)
GLUCOSE: 102 mg/dL — AB (ref 70–99)
Potassium: 3.3 mmol/L — ABNORMAL LOW (ref 3.5–5.1)
Sodium: 135 mmol/L (ref 135–145)

## 2018-04-26 LAB — I-STAT BETA HCG BLOOD, ED (MC, WL, AP ONLY): I-stat hCG, quantitative: <5 m[IU]/mL (ref <5)

## 2018-04-26 NOTE — ED Notes (Signed)
Per staff; patient left.

## 2018-04-26 NOTE — ED Triage Notes (Signed)
Pt complaining of CP and abd pain for 1 day. Also complaining of tingling in the feet. States she also has had some SOB and Dizziness.

## 2018-04-27 NOTE — Telephone Encounter (Signed)
Paperwork has been received and will be faxed once PCP is done.

## 2018-05-10 IMAGING — CR DG CHEST 2V
2 series · 2 of 2 positions shown · non-contrast
Comparison: Chest radiograph performed 03/29/2017

CLINICAL DATA: Acute onset of shortness of breath.

EXAM:
CHEST  2 VIEW

[chest pa]
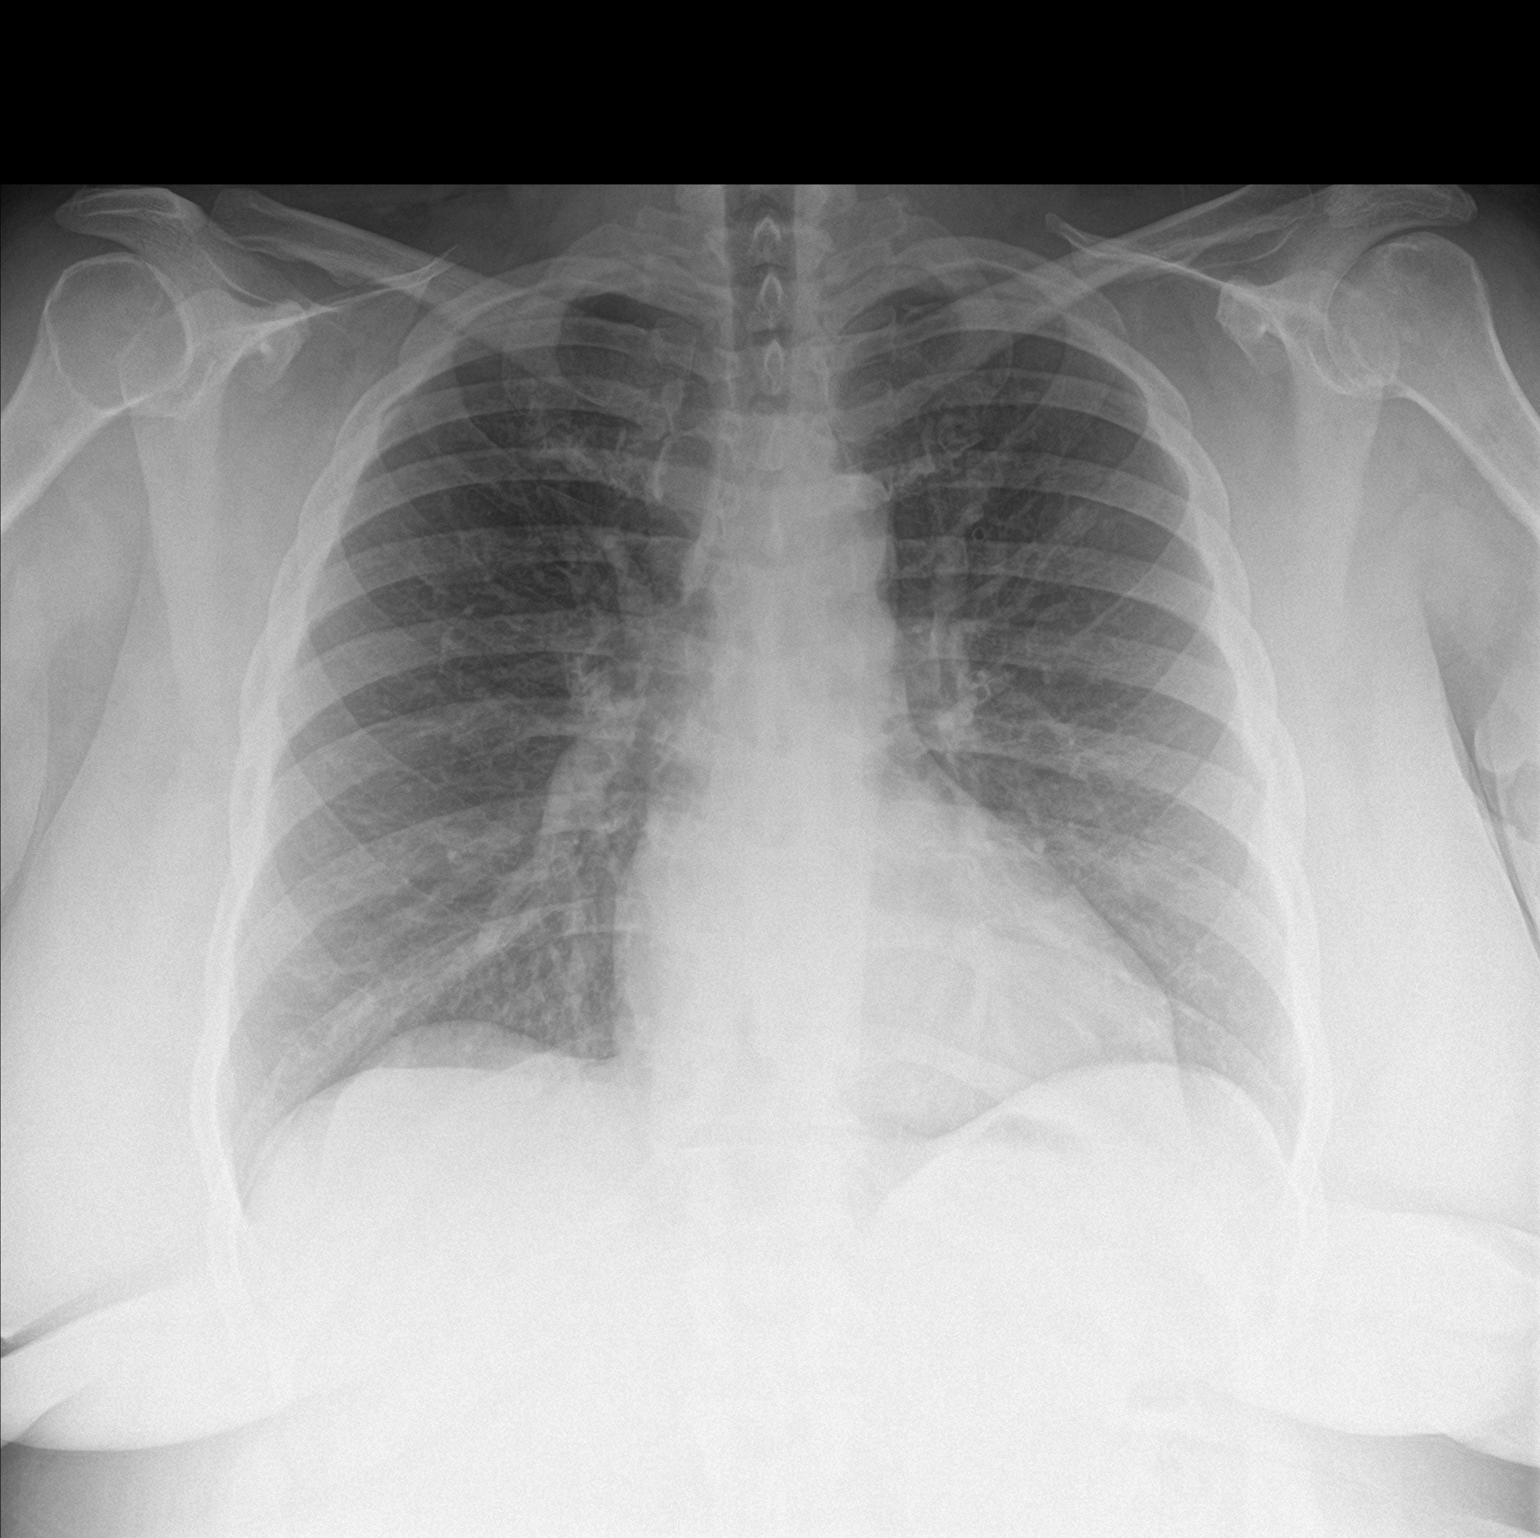

[chest lat]
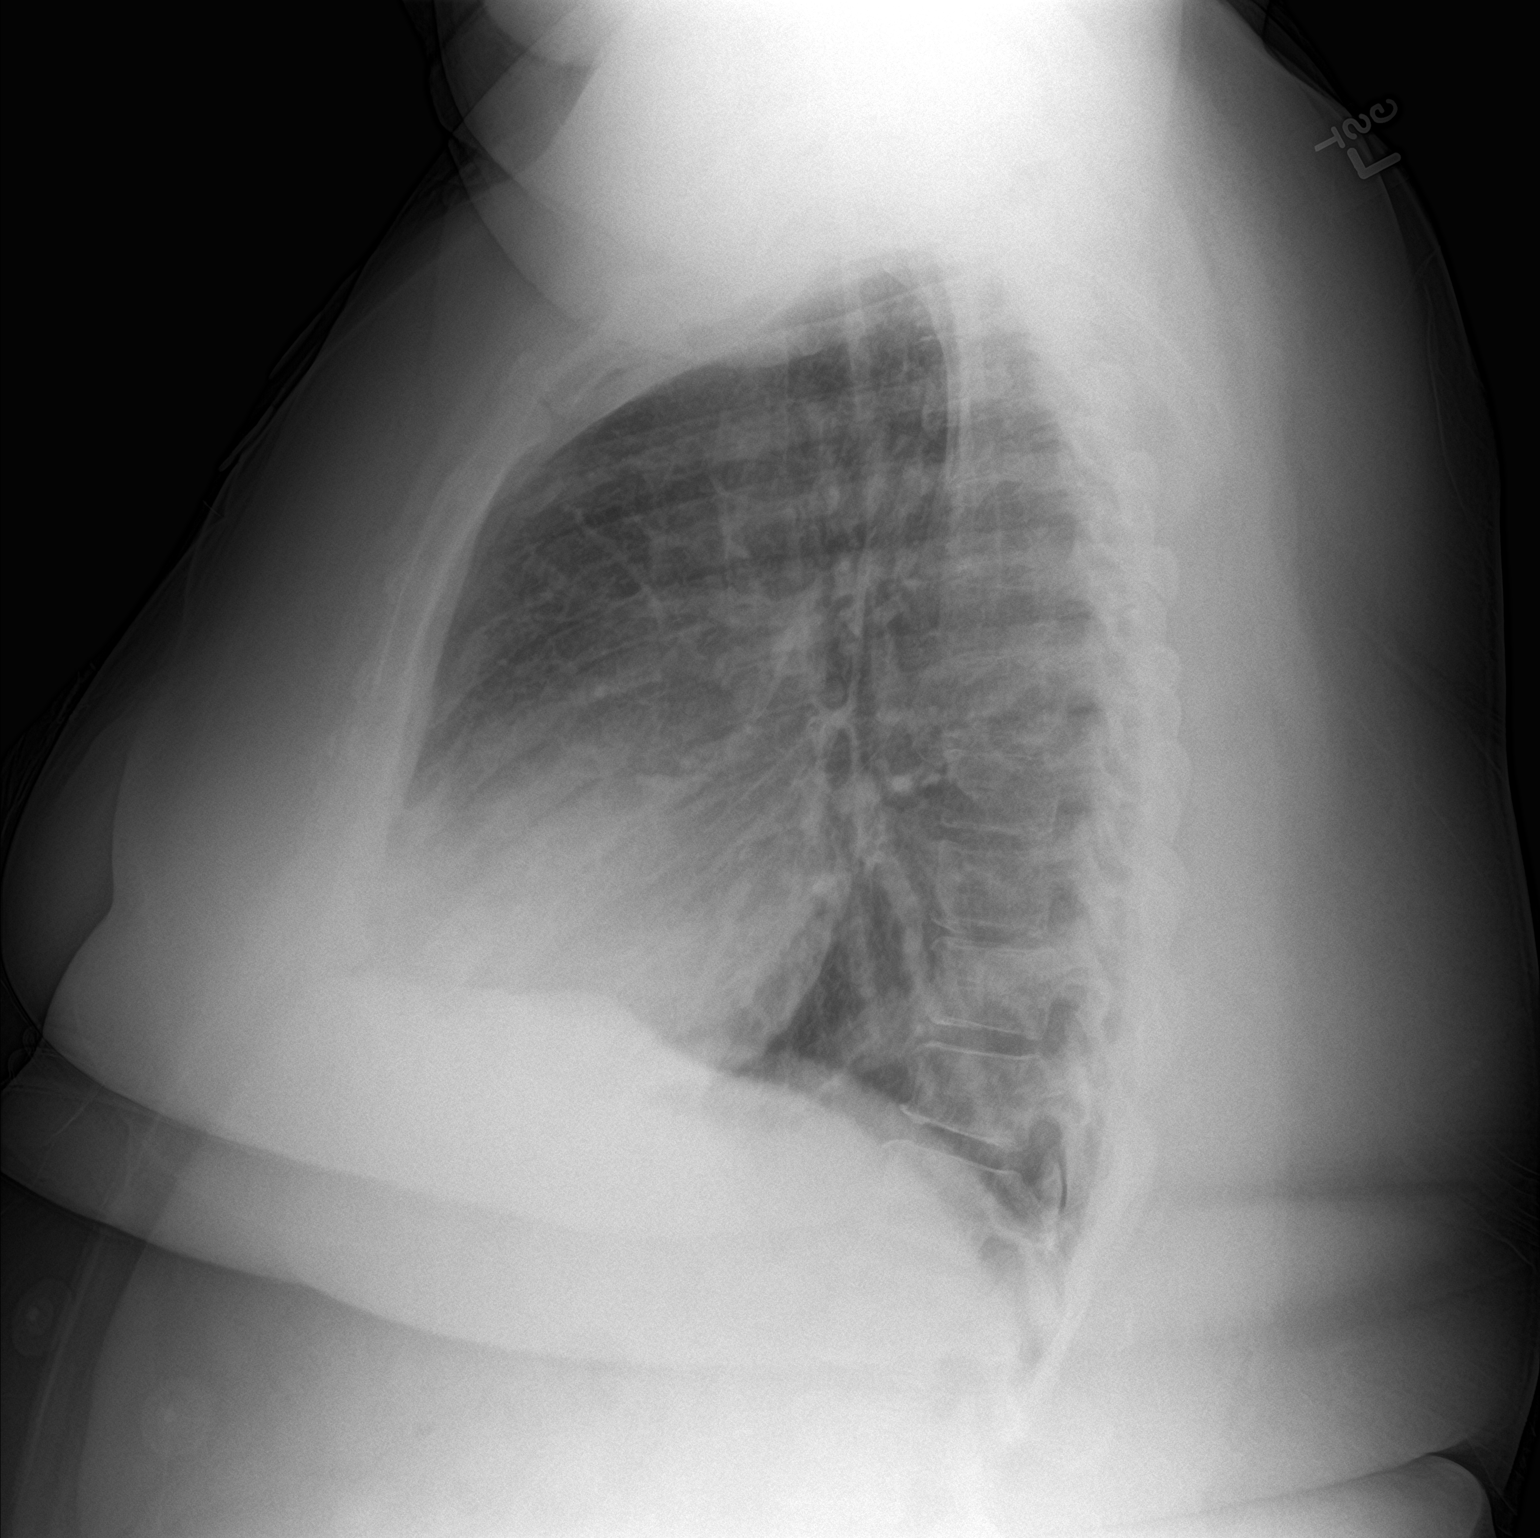

[2 of 2 positions shown; findings below may reference images not displayed]

FINDINGS: The lungs are well-aerated and clear. There is no evidence of focal
opacification, pleural effusion or pneumothorax.

The heart is normal in size; the mediastinal contour is within
normal limits. No acute osseous abnormalities are seen.
IMPRESSION: No acute cardiopulmonary process seen.

## 2018-05-13 ENCOUNTER — Encounter (HOSPITAL_COMMUNITY): Payer: Self-pay | Admitting: Emergency Medicine

## 2018-05-13 ENCOUNTER — Emergency Department (HOSPITAL_COMMUNITY): Admission: EM | Admit: 2018-05-13 | Discharge: 2018-05-13 | Discharge status: Against Medical Advice | Payer: Self-pay

## 2018-05-13 ENCOUNTER — Emergency Department (HOSPITAL_BASED_OUTPATIENT_CLINIC_OR_DEPARTMENT_OTHER): Payer: BLUE CROSS/BLUE SHIELD

## 2018-05-13 ENCOUNTER — Emergency Department (HOSPITAL_COMMUNITY): Payer: BLUE CROSS/BLUE SHIELD

## 2018-05-13 ENCOUNTER — Emergency Department (HOSPITAL_COMMUNITY)
Admission: EM | Admit: 2018-05-13 | Discharge: 2018-05-13 | Disposition: A | Discharge status: Home / Self Care | Payer: BLUE CROSS/BLUE SHIELD | Attending: Emergency Medicine | Admitting: Emergency Medicine

## 2018-05-13 DIAGNOSIS — R609 Edema, unspecified: Secondary | ICD-10-CM

## 2018-05-13 DIAGNOSIS — I110 Hypertensive heart disease with heart failure: Secondary | ICD-10-CM | POA: Insufficient documentation

## 2018-05-13 DIAGNOSIS — Z79899 Other long term (current) drug therapy: Secondary | ICD-10-CM | POA: Insufficient documentation

## 2018-05-13 DIAGNOSIS — J45901 Unspecified asthma with (acute) exacerbation: Secondary | ICD-10-CM

## 2018-05-13 DIAGNOSIS — Z87891 Personal history of nicotine dependence: Secondary | ICD-10-CM | POA: Insufficient documentation

## 2018-05-13 DIAGNOSIS — J4541 Moderate persistent asthma with (acute) exacerbation: Secondary | ICD-10-CM | POA: Insufficient documentation

## 2018-05-13 DIAGNOSIS — I5032 Chronic diastolic (congestive) heart failure: Secondary | ICD-10-CM | POA: Insufficient documentation

## 2018-05-13 LAB — CBC WITH DIFFERENTIAL/PLATELET
Abs Immature Granulocytes: 0.07 10*3/uL (ref 0.00–0.07)
Basophils Absolute: 0.1 10*3/uL (ref 0.0–0.1)
Basophils Relative: 0 %
Eosinophils Absolute: 0.2 10*3/uL (ref 0.0–0.5)
Eosinophils Relative: 1 %
HCT: 36.9 % (ref 36.0–46.0)
Hemoglobin: 11.5 g/dL — ABNORMAL LOW (ref 12.0–15.0)
IMMATURE GRANULOCYTES: 1 %
Lymphocytes Relative: 23 %
Lymphs Abs: 3.5 10*3/uL (ref 0.7–4.0)
MCH: 25.8 pg — ABNORMAL LOW (ref 26.0–34.0)
MCHC: 31.2 g/dL (ref 30.0–36.0)
MCV: 82.7 fL (ref 80.0–100.0)
Monocytes Absolute: 0.9 10*3/uL (ref 0.1–1.0)
Monocytes Relative: 6 %
Neutro Abs: 10.6 10*3/uL — ABNORMAL HIGH (ref 1.7–7.7)
Neutrophils Relative %: 69 %
Platelets: 460 10*3/uL — ABNORMAL HIGH (ref 150–400)
RBC: 4.46 MIL/uL (ref 3.87–5.11)
RDW: 19.1 % — ABNORMAL HIGH (ref 11.5–15.5)
WBC: 15.4 10*3/uL — ABNORMAL HIGH (ref 4.0–10.5)
nRBC: 0 % (ref 0.0–0.2)

## 2018-05-13 LAB — BASIC METABOLIC PANEL
ANION GAP: 9 (ref 5–15)
BUN: <5 mg/dL — ABNORMAL LOW (ref 6–20)
CO2: 25 mmol/L (ref 22–32)
Calcium: 8.6 mg/dL — ABNORMAL LOW (ref 8.9–10.3)
Chloride: 102 mmol/L (ref 98–111)
Creatinine, Ser: 0.63 mg/dL (ref 0.44–1.00)
GFR calc Af Amer: >60 mL/min (ref >60)
GFR calc non Af Amer: >60 mL/min (ref >60)
Glucose, Bld: 101 mg/dL — ABNORMAL HIGH (ref 70–99)
Potassium: 3.3 mmol/L — ABNORMAL LOW (ref 3.5–5.1)
Sodium: 136 mmol/L (ref 135–145)

## 2018-05-13 MED ORDER — MAGNESIUM SULFATE 2 GM/50ML IV SOLN
2.0000 g | Freq: Once | INTRAVENOUS | Status: AC
  Administered 2018-05-13: 2 g via INTRAVENOUS
  Filled 2018-05-13: qty 50

## 2018-05-13 MED ORDER — ALBUTEROL (5 MG/ML) CONTINUOUS INHALATION SOLN
5.0000 mg/h | INHALATION_SOLUTION | Freq: Once | RESPIRATORY_TRACT | Status: AC
  Administered 2018-05-13: 5 mg/h via RESPIRATORY_TRACT
  Filled 2018-05-13: qty 20

## 2018-05-13 MED ORDER — PREDNISONE 20 MG PO TABS
60.0000 mg | ORAL_TABLET | Freq: Once | ORAL | Status: AC
  Administered 2018-05-13: 60 mg via ORAL
  Filled 2018-05-13: qty 3

## 2018-05-13 MED ORDER — ALBUTEROL SULFATE (2.5 MG/3ML) 0.083% IN NEBU: 2.5000 mg | INHALATION_SOLUTION | Freq: Four times a day (QID) | RESPIRATORY_TRACT | 1 refills | Status: DC | PRN

## 2018-05-13 MED ORDER — POTASSIUM CHLORIDE CRYS ER 20 MEQ PO TBCR
40.0000 meq | EXTENDED_RELEASE_TABLET | Freq: Once | ORAL | Status: AC
  Administered 2018-05-13: 40 meq via ORAL
  Filled 2018-05-13: qty 2

## 2018-05-13 MED ORDER — ALBUTEROL SULFATE (2.5 MG/3ML) 0.083% IN NEBU
5.0000 mg | INHALATION_SOLUTION | Freq: Once | RESPIRATORY_TRACT | Status: AC
  Administered 2018-05-13: 5 mg via RESPIRATORY_TRACT
  Filled 2018-05-13: qty 6

## 2018-05-13 MED ORDER — PREDNISONE 20 MG PO TABS: 40.0000 mg | ORAL_TABLET | Freq: Every day | ORAL | 0 refills | Status: DC

## 2018-05-13 MED ORDER — IPRATROPIUM-ALBUTEROL 0.5-2.5 (3) MG/3ML IN SOLN: RESPIRATORY_TRACT | 0 refills | Status: DC

## 2018-05-13 MED ORDER — IPRATROPIUM-ALBUTEROL 0.5-2.5 (3) MG/3ML IN SOLN
3.0000 mL | Freq: Once | RESPIRATORY_TRACT | Status: AC
  Administered 2018-05-13: 3 mL via RESPIRATORY_TRACT
  Filled 2018-05-13: qty 3

## 2018-05-13 NOTE — ED Triage Notes (Signed)
Pt states she began wheezing last night and feels she needs a breathing treatment.

## 2018-05-13 NOTE — Consult Note (Signed)
Medical Consultation   LAQUIDA [name]  ULA:[id number]  DOB: 1970/12/01  DOA: 05/13/2018   Requesting physician: Lorin Glass, PA-C  Reason for consultation: Asthma exacerbation  History of Present Illness: April [name] is an 48 y.o. female with past medical history of asthma, COPD, hypertension, sleep apnea, paroxysmal A. fib who presented to the emergency department complains of shortness of breath that started since yesterday.  Patient was apparently alright  till yesterday.  This morning when she woke up as she was very dyspneic and windy.  She try to use her inhalers without relief of her symptoms.  She presented today to the emergency department.  When she presented here.  She was saturating in the range of 93 to 94%.  She was wheezing.  She was given bronchodilators, prednisone, magnesium.  She has history of intermittent asthma.  Her last asthma attack was in April last year.  She has been intubated once for her asthma exacerbation.  Patient does not smoke currently. Patient seen and examined the bedside in the emergency department.  During my evaluation she was very comfortable and hemodynamically stable.  I did not auscultate any wheezes , her lungs were clear.  Chest x-ray done in the emergency department did not show pneumonia. Patient denied any fever, chills, chest pain, abdominal pain, dysuria, nausea, vomiting, headache.       Review of Systems:  ROS As per HPI otherwise 10 point review of systems negative.  Past Medical History: Past Medical History:  Diagnosis Date  . Acanthosis nigricans   . Anxiety   . Arthritis    "knees" (04/28/2017)  . Asthma    Followed by Dr. Annamaria Boots (pulmonology); receives every other week omalizumab injections; has frequent exacerbations  . Chronic diastolic CHF (congestive heart failure) ([place]) 01/17/2017  . COPD (chronic obstructive pulmonary disease) ([place])    PFTs in 2002, FEV1/FVC 65, no post bronchodilater test done    . Depression   . GERD (gastroesophageal reflux disease)   . Headache(784.0)    "q couple days" (04/28/2017)  . Helicobacter pylori (H. pylori) infection   . Hypertension, essential   . Insomnia   . Menorrhagia   . Morbid obesity ([place])   . OSA on CPAP    Sleep study 2008 - mild OSA, not enough events to titrate CPAP; wears CPAP now/pt on 04/28/2017  . Pneumonia X 1  . Seasonal allergies   . Shortness of breath   . Tobacco user     Past Surgical History: Past Surgical History:  Procedure Laterality Date  . CARDIOVERSION N/A 05/30/2017   Procedure: CARDIOVERSION;  Surgeon: Sanda Klein, MD;  Location: [facility];  Service: Cardiovascular;  Laterality: N/A;  . REDUCTION MAMMAPLASTY Bilateral 09/2011  . TUBAL LIGATION  1996   bilateral     Allergies:   Allergies  Allergen Reactions  . Contrast Media [Iodinated Diagnostic Agents] Itching    Ct contrast  . Oxycodone-Acetaminophen Itching     Social History:  reports that she quit smoking about 3 years ago. Her smoking use included cigarettes. She has a 13.00 pack-year smoking history. She has never used smokeless tobacco. She reports that she does not drink alcohol or use drugs.   Family History: Family History  Problem Relation Age of Onset  . Hypertension Mother   . Asthma Daughter   . Cancer Paternal Aunt   . Asthma Maternal Grandmother  Physical Exam: Vitals:   05/13/18 1200 05/13/18 1442 05/13/18 1525  BP: (!) 190/104  (!) 178/90  Pulse: 97 85 (!) 101  Resp: (!) 26 20 20   Temp: (!) 97.4 F (36.3 C)    TempSrc: Oral    SpO2: 93% 93% 98%    General exam: Appears calm and comfortable ,Not in distress,obese HEENT:PERRL,Oral mucosa moist, Ear/Nose normal on gross exam Respiratory system: Bilateral equal air entry, normal vesicular breath sounds, no wheezes or crackles  Cardiovascular system: S1 & S2 heard, RRR. No JVD, murmurs, rubs, gallops or clicks. Gastrointestinal system: Abdomen is  nondistended, soft and nontender. No organomegaly or masses felt. Normal bowel sounds heard. Central nervous system: Alert and oriented. No focal neurological deficits. Extremities: No edema, no clubbing ,no cyanosis, distal peripheral pulses palpable. Skin: No rashes, lesions or ulcers,no icterus ,no pallor MSK: Normal muscle bulk,tone ,power Psychiatry: Judgement and insight appear normal. Mood & affect appropriate.     Data reviewed:  I have personally reviewed following labs and imaging studies Labs:  CBC: Recent Labs  Lab 05/13/18 1412  WBC 15.4*  NEUTROABS 10.6*  HGB 11.5*  HCT 36.9  MCV 82.7  PLT 460*    Basic Metabolic Panel: Recent Labs  Lab 05/13/18 1412  NA 136  K 3.3*  CL 102  CO2 25  GLUCOSE 101*  BUN <5*  CREATININE 0.63  CALCIUM 8.6*   GFR CrCl cannot be calculated (Unknown ideal weight.). Liver Function Tests: No results for input(s): AST, ALT, ALKPHOS, BILITOT, PROT, ALBUMIN in the last 168 hours. No results for input(s): LIPASE, AMYLASE in the last 168 hours. No results for input(s): AMMONIA in the last 168 hours. Coagulation profile No results for input(s): INR, PROTIME in the last 168 hours.  Cardiac Enzymes: No results for input(s): CKTOTAL, CKMB, CKMBINDEX, TROPONINI in the last 168 hours. BNP: Invalid input(s): POCBNP CBG: No results for input(s): GLUCAP in the last 168 hours. D-Dimer No results for input(s): DDIMER in the last 72 hours. Hgb A1c No results for input(s): HGBA1C in the last 72 hours. Lipid Profile No results for input(s): CHOL, HDL, LDLCALC, TRIG, CHOLHDL, LDLDIRECT in the last 72 hours. Thyroid function studies No results for input(s): TSH, T4TOTAL, T3FREE, THYROIDAB in the last 72 hours.  Invalid input(s): FREET3 Anemia work up No results for input(s): VITAMINB12, FOLATE, FERRITIN, TIBC, IRON, RETICCTPCT in the last 72 hours. Urinalysis    Component Value Date/Time   COLORURINE YELLOW 10/13/2017 1100    APPEARANCEUR CLEAR 10/13/2017 1100   LABSPEC 1.005 10/13/2017 1100   PHURINE 5.0 10/13/2017 1100   GLUCOSEU NEGATIVE 10/13/2017 1100   GLUCOSEU NEG mg/dL 10/28/2007 2049   HGBUR SMALL (A) 10/13/2017 1100   BILIRUBINUR NEGATIVE 10/13/2017 1100   KETONESUR NEGATIVE 10/13/2017 1100   PROTEINUR NEGATIVE 10/13/2017 1100   UROBILINOGEN 1.0 11/21/2014 0707   NITRITE NEGATIVE 10/13/2017 1100   LEUKOCYTESUR SMALL (A) 10/13/2017 1100     Microbiology No results found for this or any previous visit (from the past 240 hour(s)).     Inpatient Medications:   Scheduled Meds: . potassium chloride  40 mEq Oral Once   Continuous Infusions:   Radiological Exams on Admission: Dg Chest 2 View  Result Date: 05/13/2018 CLINICAL DATA:  Cough, shortness of breath, dry cough, history asthma, COPD, CHF, hypertension, former smoker EXAM: CHEST - 2 VIEW COMPARISON:  04/26/2018 FINDINGS: Normal heart size, mediastinal contours, and pulmonary vascularity. Peribronchial thickening and slight accentuation of perihilar markings which may reflect bronchitis  or asthma. No definite acute infiltrate, pleural effusion or pneumothorax. Bones unremarkable. IMPRESSION: Changes of bronchitis versus asthma without acute infiltrate. Electronically Signed   By: Lavonia Dana M.D.   On: 05/13/2018 13:07   Vas [place] Lower Extremity Venous (dvt) (only [facility])  Result Date: 05/13/2018  Lower Venous Study Indications: Edema.  Limitations: Body habitus. Performing Technologist: Abram Sander RVS  Examination Guidelines: A complete evaluation includes B-mode imaging, spectral Doppler, color Doppler, and power Doppler as needed of all accessible portions of each vessel. Bilateral testing is considered an integral part of a complete examination. Limited examinations for reoccurring indications may be performed as noted.  Right Venous Findings: +---------+---------------+---------+-----------+----------+--------------+           CompressibilityPhasicitySpontaneityPropertiesSummary        +---------+---------------+---------+-----------+----------+--------------+ CFV      Full           Yes      Yes                                 +---------+---------------+---------+-----------+----------+--------------+ SFJ      Full                                                        +---------+---------------+---------+-----------+----------+--------------+ FV Prox  Full                                                        +---------+---------------+---------+-----------+----------+--------------+ FV Mid   Full                                                        +---------+---------------+---------+-----------+----------+--------------+ FV DistalFull                                                        +---------+---------------+---------+-----------+----------+--------------+ PFV      Full                                                        +---------+---------------+---------+-----------+----------+--------------+ POP      Full           Yes      Yes                                 +---------+---------------+---------+-----------+----------+--------------+ PTV      Full                                                        +---------+---------------+---------+-----------+----------+--------------+  PERO                                                  Not visualized +---------+---------------+---------+-----------+----------+--------------+  Left Venous Findings: +---+---------------+---------+-----------+----------+-------+    CompressibilityPhasicitySpontaneityPropertiesSummary +---+---------------+---------+-----------+----------+-------+ CFVFull           Yes      Yes                          +---+---------------+---------+-----------+----------+-------+    Summary: Right: There is no evidence of deep vein thrombosis in the lower extremity. A cystic  structure is found in the popliteal fossa. Left: No evidence of common femoral vein obstruction.  *See table(s) above for measurements and observations.    Preliminary     Impression/Recommendations Principal Problem:   Asthma exacerbation  Acute respiratory with hypoxia/asthma exacerbation: Currently respiratory status stable.  She was treated with bronchodilators, magnesium, prednisone with significant improvement. Chest x-ray did not show any pneumonia. She has history of intermittent asthma.  She does not smoke currently.  She has inhalers at home: Albuterol and Dulera. She can be discharged on oral prednisone 40 mg daily for 5 days.  Hypertension: Mildly hypertensive on presentation.  Most likely associated with her respiratory distress.  Continue home antihypertensives.  History of diastolic CHF: Currently compensated.  Echocardiogram as per 4/19 showed ejection fraction of 60 to 65%, no wall motion abnormality.  On Lasix at home.  OSA: Was on CPAP at home.  Machine not working at present.  I would strongly recommend her to follow-up with her PCP for this.  Paroxysmal A. fib: Currently in normal sinus rhythm.  On Eliquis for anticoagulation.  Currently rate is controlled.  Hypokalemia: Being supplemented with potassium.  Leukocytosis: On reviewing her previous lab records, she has chronic leukocytosis.  Needs to be followed up as an outpatient.  No signs of sepsis or infectious etiology at present.  Patient wants to go home.  Her respiratory status is stable.  She is saturating fine on room air.  I think she can be safely discharged home to continue her home inhalers and prednisone.  I would recommend her to follow-up with her PCP in a week.    Thank you for this consultation.     Time Spent: 40 mins.  Shelly Coss M.D. [facility] [phone] 05/13/2018, 5:30 PM

## 2018-05-13 NOTE — ED Notes (Signed)
Patient called x3 for triage with no answer. 

## 2018-05-13 NOTE — Progress Notes (Signed)
Right lower extremity venous duplex has been completed.   Preliminary results in CV Proc.   Abram Sander 05/13/2018 3:53 PM

## 2018-05-13 NOTE — ED Provider Notes (Cosign Needed)
[facility] EMERGENCY DEPARTMENT Provider Note   CSN: [id number] Arrival date & time: 05/13/18  1155   History   Chief Complaint Chief Complaint  Patient presents with  . Asthma    HPI April [name] is a 48 y.o. female with past medical history significant for chronic headache, hypertension, morbid obesity, asthma who presents for evaluation of asthma exacerbation.  Patient states his been wheezing x1 day.  Patient states she does not have her home nebulizer treatments which she is supposed to be using daily.  Has been using her albuterol rescue inhaler without relief of her symptoms.  Last used 3 hours PTA.  Patient states she has been admitted and has been intubated previously for asthma exacerbations.  Denies recent upper respiratory infection, fever, chills, nausea, vomiting, chest pain, shortness of breath, cough, abdominal pain, diarrhea dysuria.  No alleviating or aggrivating factors.  Denies preceding upper respiratory symptoms.  On reevaluation, patient states she has had pain to her right posterior calf.  No history of previous DVT, PE, exogenous hormone use, recent surgeries.  Patient states pain is been present x6 weeks since she was previously hospitalized.  States this area intermittently swells.  Of note, patient states she does have chronic pain to her lower extremities, however states this feels different  History provided by patient.  No interpreter was used.  PCP- Margarita Rana with [facility]  HPI  Past Medical History:  Diagnosis Date  . Acanthosis nigricans   . Anxiety   . Arthritis    "knees" (04/28/2017)  . Asthma    Followed by Dr. Annamaria Boots (pulmonology); receives every other week omalizumab injections; has frequent exacerbations  . Chronic diastolic CHF (congestive heart failure) ([place]) 01/17/2017  . COPD (chronic obstructive pulmonary disease) ([place])    PFTs in 2002, FEV1/FVC 65, no post bronchodilater test done    . Depression   . GERD (gastroesophageal reflux disease)   . Headache(784.0)    "q couple days" (04/28/2017)  . Helicobacter pylori (H. pylori) infection   . Hypertension, essential   . Insomnia   . Menorrhagia   . Morbid obesity ([place])   . OSA on CPAP    Sleep study 2008 - mild OSA, not enough events to titrate CPAP; wears CPAP now/pt on 04/28/2017  . Pneumonia X 1  . Seasonal allergies   . Shortness of breath   . Tobacco user     Patient Active Problem List   Diagnosis Date Noted  . Urinary incontinence 11/17/2017  . Nausea with vomiting 11/06/2017  . Hypomagnesemia 10/27/2017  . Dysphagia 10/27/2017  . Physical deconditioning 10/25/2017  . Acute maxillary sinusitis 10/24/2017  . Emesis, persistent 10/08/2017  . Volume depletion 10/08/2017  . Pleuritic chest pain 10/08/2017  . SOB (shortness of breath) 10/08/2017  . Debility 09/22/2017  . HCAP (healthcare-associated pneumonia) 08/06/2017  . Chronic atrial fibrillation 07/29/2017  . COPD with acute exacerbation ([place]) 02/10/2017  . Dyspnea 02/04/2017  . Chronic diastolic CHF (congestive heart failure) ([place]) 01/17/2017  . Metabolic syndrome 70/04/7492  . Moderate persistent asthma with exacerbation 10/19/2016  . Normocytic anemia 10/02/2016  . Elevated hemoglobin A1c 05/05/2016  . GERD (gastroesophageal reflux disease) 08/30/2015  . Anxiety and depression 08/30/2015  . Generalized anxiety disorder 08/30/2015  . Seasonal allergic rhinitis 08/29/2013  . Leukocytosis 10/21/2012  . Tobacco abuse in remission 10/07/2012  . COPD mixed type ([place]) 05/07/2012  . Knee pain, bilateral 04/25/2011  . Primary insomnia 03/14/2011  .  OSA on CPAP 12/19/2010  . Obstructive sleep apnea 12/19/2010  . Hypokalemia 08/13/2010  . Cervical back pain with evidence of disc disease 04/08/2008  . Essential hypertension 07/31/2006  . Morbid obesity with body mass index of 50.0-59.9 in adult ([place]) 06/17/2006  . Major depressive disorder,  recurrent episode ([place]) 04/10/2006    Past Surgical History:  Procedure Laterality Date  . CARDIOVERSION N/A 05/30/2017   Procedure: CARDIOVERSION;  Surgeon: Sanda Klein, MD;  Location: [facility];  Service: Cardiovascular;  Laterality: N/A;  . REDUCTION MAMMAPLASTY Bilateral 09/2011  . TUBAL LIGATION  1996   bilateral     OB History   No obstetric history on file.      Home Medications    Prior to Admission medications   Medication Sig Start Date End Date Taking? Authorizing Provider  apixaban (ELIQUIS) 5 MG TABS tablet Take 5 mg by mouth 2 (two) times daily. 10/02/17   [provider]  cetirizine (ZYRTEC) 10 MG tablet Take 10 mg by mouth daily. 10/21/17   [provider]  Ferrous Sulfate (IRON) 325 (65 Fe) MG TABS Take 1 tablet (325 mg total) by mouth every morning. Patient taking differently: Take 1 tablet by mouth daily.  01/26/18   Argentina Donovan, PA-C  fluticasone (FLONASE) 50 MCG/ACT nasal spray Place 1 spray into both nostrils daily. 10/16/17   Sheikh, Omair Latif, DO  Fluticasone-Salmeterol (ADVAIR) 250-50 MCG/DOSE AEPB Inhale 1 puff into the lungs 2 (two) times daily. 10/16/17   [provider]  furosemide (LASIX) 40 MG tablet Take 40 mg by mouth daily. 10/02/17 10/02/18  [provider]  gabapentin (NEURONTIN) 300 MG capsule Take 1 capsule (300 mg total) by mouth 2 (two) times daily. 03/18/18   [name], MD  HYDROcodone-acetaminophen (NORCO) 10-325 MG tablet Take 1 tablet by mouth 4 (four) times daily as needed for moderate pain.  02/23/18   [provider]  ipratropium-albuterol (DUONEB) 0.5-2.5 (3) MG/3ML SOLN Inhale 3 ml two times daily and every 2 hours as needed for shortness of breath or wheezing    [provider]  Melatonin 3 MG TABS Take 1 tablet by mouth at bedtime. 10/20/17   [provider]  montelukast (SINGULAIR) 10 MG tablet Take 10 mg by mouth at bedtime. 10/02/17   [provider]    omeprazole (PRILOSEC) 20 MG capsule TAKE 1 CAPSULE BY MOUTH EVERY DAY Patient taking differently: Take 20 mg by mouth daily.  02/20/18   Baird Lyons D, MD  potassium chloride SA (K-DUR,KLOR-CON) 20 MEQ tablet Take 1 tablet (20 mEq total) by mouth daily. 03/18/18   [name], MD  predniSONE (DELTASONE) 20 MG tablet 2 tabs po daily x 4 days 03/12/18   Mesner, Corene Cornea, MD  PROAIR HFA 108 [phone] Base) MCG/ACT inhaler Inhale 2 puffs into the lungs every 4 (four) hours as needed for wheezing.  03/08/18   [provider]  zolpidem (AMBIEN) 5 MG tablet TAKE 1 TABLET(5 MG) BY MOUTH AT BEDTIME 03/16/18   [name], MD    Family History Family History  Problem Relation Age of Onset  . Hypertension Mother   . Asthma Daughter   . Cancer Paternal Aunt   . Asthma Maternal Grandmother     Social History Social History   Tobacco Use  . Smoking status: Former Smoker    Packs/day: 0.50    Years: 26.00    Pack years: 13.00    Types: Cigarettes    Last attempt to quit:  09/12/2014    Years since quitting: 3.6  . Smokeless tobacco: Never Used  Substance Use Topics  . Alcohol use: No  . Drug use: No     Allergies   Contrast media [iodinated diagnostic agents] and Oxycodone-acetaminophen   Review of Systems Review of Systems  Constitutional: Negative.   HENT: Negative.   Eyes: Negative.   Respiratory: Positive for cough and wheezing. Negative for apnea, choking, chest tightness, shortness of breath and stridor.   Cardiovascular: Negative.   Gastrointestinal: Negative.   Genitourinary: Negative.   Musculoskeletal: Negative.   Skin: Negative.   Neurological: Negative.   All other systems reviewed and are negative.    Physical Exam Updated Vital Signs BP (!) 190/104 (BP Location: Left Wrist)   Pulse 85   Temp (!) 97.4 F (36.3 C) (Oral)   Resp 20   LMP 04/20/2018   SpO2 93%   Physical Exam Vitals signs and nursing note reviewed.  Constitutional:       General: She is not in acute distress.    Appearance: She is well-developed. She is obese. She is not ill-appearing, toxic-appearing or diaphoretic.  HENT:     Head: Normocephalic and atraumatic.     Right Ear: Tympanic membrane, ear canal and external ear normal. There is no impacted cerumen.     Left Ear: Tympanic membrane, ear canal and external ear normal. There is no impacted cerumen.     Nose: Nose normal.     Mouth/Throat:     Mouth: Mucous membranes are moist.     Pharynx: Oropharynx is clear.     Comments: Posterior oropharynx clear.  Tonsils without edema or exudate.  Uvula midline no deviation.  No trismus, dysphasia, dysphonia. Eyes:     Pupils: Pupils are equal, round, and reactive to light.  Neck:     Musculoskeletal: Normal range of motion.     Comments: No neck stiffness or neck rigidity. Cardiovascular:     Rate and Rhythm: Normal rate.     Pulses: Normal pulses.     Heart sounds: Normal heart sounds. No murmur. No friction rub. No gallop.   Pulmonary:     Effort: No respiratory distress.     Comments: Moderate diffuse expiratory wheezing.  Patient with mild tachypnea at 25.  No accessory muscle usage.  Able to speak in full sentences without difficulty. Abdominal:     General: There is no distension.     Comments: Soft, nontender without rebound or guarding.  Musculoskeletal: Normal range of motion.        General: Tenderness present. No swelling, deformity or signs of injury.     Right lower leg: No edema.     Left lower leg: No edema.     Comments: Moves all extremities without difficulty.  Patient ambulatory in department without difficulty.  Tenderness to right posterior calf.  Patient states this is been present since she was hospitalized approximately 6 weeks ago.  States she has had intermittent swelling to her posterior calf.  No redness or warmth.  Skin:    General: Skin is warm and dry.     Comments: No edema, erythema, ecchymosis or warmth.    Neurological:     Mental Status: She is alert.      ED Treatments / Results  Labs (all labs ordered are listed, but only abnormal results are displayed) Labs Reviewed  CBC WITH DIFFERENTIAL/PLATELET - Abnormal; Notable for the following components:      Result Value  WBC 15.4 (*)    Hemoglobin 11.5 (*)    MCH 25.8 (*)    RDW 19.1 (*)    Platelets 460 (*)    Neutro Abs 10.6 (*)    All other components within normal limits  BASIC METABOLIC PANEL - Abnormal; Notable for the following components:   Potassium 3.3 (*)    Glucose, Bld 101 (*)    BUN <5 (*)    Calcium 8.6 (*)    All other components within normal limits    EKG None  Radiology Dg Chest 2 View  Result Date: 05/13/2018 CLINICAL DATA:  Cough, shortness of breath, dry cough, history asthma, COPD, CHF, hypertension, former smoker EXAM: CHEST - 2 VIEW COMPARISON:  04/26/2018 FINDINGS: Normal heart size, mediastinal contours, and pulmonary vascularity. Peribronchial thickening and slight accentuation of perihilar markings which may reflect bronchitis or asthma. No definite acute infiltrate, pleural effusion or pneumothorax. Bones unremarkable. IMPRESSION: Changes of bronchitis versus asthma without acute infiltrate. Electronically Signed   By: Lavonia Dana M.D.   On: 05/13/2018 13:07    Procedures .Critical Care Performed by: Nettie Elm, PA-C Authorized by: Nettie Elm, PA-C   Critical care provider statement:    Critical care time (minutes):  35   Critical care was necessary to treat or prevent imminent or life-threatening deterioration of the following conditions:  Respiratory failure (Continuous nebulizer)   Critical care was time spent personally by me on the following activities:  Discussions with consultants, evaluation of patient's response to treatment, examination of patient, ordering and performing treatments and interventions, ordering and review of laboratory studies, ordering and review of  radiographic studies, pulse oximetry, re-evaluation of patient's condition, obtaining history from patient or surrogate and review of old charts   (including critical care time)  Medications Ordered in ED Medications  albuterol (PROVENTIL) (2.5 MG/3ML) 0.083% nebulizer solution 5 mg (5 mg Nebulization Given 05/13/18 1225)  predniSONE (DELTASONE) tablet 60 mg (60 mg Oral Given 05/13/18 1225)  ipratropium-albuterol (DUONEB) 0.5-2.5 (3) MG/3ML nebulizer solution 3 mL (3 mLs Nebulization Given 05/13/18 1327)  magnesium sulfate IVPB 2 g 50 mL (2 g Intravenous New Bag/Given 05/13/18 1452)  albuterol (PROVENTIL,VENTOLIN) solution continuous neb (5 mg/hr Nebulization Given 05/13/18 1438)     Initial Impression / Assessment and Plan / ED Course  I have reviewed the triage vital signs and the nursing notes.  Pertinent labs & imaging results that were available during my care of the patient were reviewed by me and considered in my medical decision making (see chart for details).  48 year old female who appears otherwise well presents for evaluation of asthma exacerbation.  Afebrile, nonseptic, non-ill-appearing.  Patient states she has been out of her nebulizer inhaler and she has had increasing wheezing over the last 24 hours.  Has been using her home albuterol rescue inhaler without relief of her symptoms.  Patient states this feels apical of her asthma exacerbations.  Lungs with moderate diffuse expiratory wheezing.  No accessory muscle usage.  She is tachypneic without tachycardia.  Oxygen saturation 93% on room air with good waveform.  Does not appear in any acute respiratory distress.  Patient did not have preceding upper respiratory symptoms. No symptoms of flulike illness. Will give steroids, albuterol treatment and reevaluate.  1225: Patient received steroids, albuterol nebulizer treatment.  Patient with continued bilateral expiratory wheezing.  No evidence of acute respiratory distress.  No accessory  muscle usage.  Will give DuoNeb treatment and reevaluate.    1340: Patient  received DuoNeb treatment.  Patient with continued bilateral expiratory wheezing.  No evidence of acute respiratory distress.  No accessory muscle usage.  Patient able speak in full sentences without difficulty.  We will plan for 1 hour continuous nebulizer treatment as well as IV magnesium and reevaluate.  On reevaluation, patient states she has had right posterior calf pain onset 6 weeks ago.  Patient states she has had this since she was hospitalized at [facility] for pneumonia.  I do not see records of this in her epic chart.  No edema, erythema or warmth, however does have posterior calf tenderness.  Given her history of recent immobilization and symptom onset will obtain ultrasound to rule out DVT. Thorough review of patient's past medical record indicates patient has been on ECMO for previous asthma exacerbations.  Given severity of patient's previous exacerbations will likely need admission for observation overnight.  Chest x-ray negative for infiltrates, cardiomegaly, pneumothorax, pulmonary edema.  There are changes secondary to asthma.  1500: Patient is half way through a 1 hour continuous nebulizer and is receiving IV magnesium.  She continues to have diffuse expiratory wheezing.  CBC with leukocytosis at 15.4, Hgb 11.5, previous 12.0, at patient baseline.    Given continuous wheezing patient will likely need admission. Metabolic panel pending at care transfer.  [place] pending at shift change. Patient care transferred to Delane Ginger at care transfer.  Patient will likely need admission for observation secondary to asthma exacerbation.    Final Clinical Impressions(s) / ED Diagnoses   Final diagnoses:  Moderate persistent asthma with exacerbation    ED Discharge Orders    None       Marzella Miracle A, PA-C 05/13/18 1521

## 2018-05-13 NOTE — Discharge Instructions (Signed)
If you have any concerns please do not hesitate to return to the emergency room.    Today I refilled your DuoNeb inhaler prescription along with gave you a prescription for albuterol nebulizers in case you need that refill also.  I have given you a prescription today for prednisone.  You were given prednisone while in the emergency room so please do not take your first dose of prednisone until approximately 24 hours after you got your dose in the emergency room.   I have given you a prescription for steroids today.  Some common side effects include feelings of extra energy, feeling warm, increased appetite, and stomach upset.  If you are diabetic your sugars may run higher than usual.

## 2018-05-13 NOTE — ED Notes (Signed)
Pt ambulated to the BR with steady gait.   

## 2018-05-13 NOTE — ED Provider Notes (Addendum)
I assumed care of patient at shift change from [facility], PA-C, please see her note for full H&P.  Briefly patient is here for evaluation of an asthma exacerbation.  She reportedly ran out of her home nebulizers and has been attempting to treat with inhaler as needed.  Her asthma is severe enough that she has previously required intubation and previously required support with ECMO.  Physical Exam  BP (!) 178/90   Pulse (!) 101   Temp (!) 97.4 F (36.3 C) (Oral)   Resp 20   LMP 04/20/2018   SpO2 98%   Physical Exam Vitals signs and nursing note reviewed.  Constitutional:      General: She is not in acute distress. Cardiovascular:     Rate and Rhythm: Normal rate.     Heart sounds: Normal heart sounds.  Pulmonary:     Comments: Mild tachypnea and increased work of breathing.  Faint inspiratory and expiratory wheezes bilaterally. Neurological:     General: No focal deficit present.     Mental Status: She is alert.  Psychiatric:        Mood and Affect: Mood normal.     ED Course/Procedures   Clinical Course as of May 13 1730  Wed May 13, 2018  1704 Spoke with hospitalist who will admit patient.    [EH]  1718 Spoke with hospitalist who states "patient does not need to be admitted."  He is requesting her to be discharged home.  He requests she be placed on prednisone 5 days at 40 mg, and refill nebs and inhalers as needed, said he will write a short note.      [EH]  1724 Spoke with hospitalist again who requests I give rx for a z-pack, given leukocytosis.  I suggested that her white count may be elevated due to steroids and asthma exacerbation.  He still recommended it. I checked her previous EKG showing QtC of almost 500, he then said that she doesn't need antibiotics.     [EH]    Clinical Course User Index [EH] Lorin Glass, PA-C    Procedures   Dg Chest 2 View  Result Date: 05/13/2018 CLINICAL DATA:  Cough, shortness of breath, dry cough, history asthma,  COPD, CHF, hypertension, former smoker EXAM: CHEST - 2 VIEW COMPARISON:  04/26/2018 FINDINGS: Normal heart size, mediastinal contours, and pulmonary vascularity. Peribronchial thickening and slight accentuation of perihilar markings which may reflect bronchitis or asthma. No definite acute infiltrate, pleural effusion or pneumothorax. Bones unremarkable. IMPRESSION: Changes of bronchitis versus asthma without acute infiltrate. Electronically Signed   By: Lavonia Dana M.D.   On: 05/13/2018 13:07   Vas [place] Lower Extremity Venous (dvt) (only [facility])  Result Date: 05/13/2018  Lower Venous Study Indications: Edema.  Limitations: Body habitus. Performing Technologist: Abram Sander RVS  Examination Guidelines: A complete evaluation includes B-mode imaging, spectral Doppler, color Doppler, and power Doppler as needed of all accessible portions of each vessel. Bilateral testing is considered an integral part of a complete examination. Limited examinations for reoccurring indications may be performed as noted.  Right Venous Findings: +---------+---------------+---------+-----------+----------+--------------+          CompressibilityPhasicitySpontaneityPropertiesSummary        +---------+---------------+---------+-----------+----------+--------------+ CFV      Full           Yes      Yes                                 +---------+---------------+---------+-----------+----------+--------------+  SFJ      Full                                                        +---------+---------------+---------+-----------+----------+--------------+ FV Prox  Full                                                        +---------+---------------+---------+-----------+----------+--------------+ FV Mid   Full                                                        +---------+---------------+---------+-----------+----------+--------------+ FV DistalFull                                                         +---------+---------------+---------+-----------+----------+--------------+ PFV      Full                                                        +---------+---------------+---------+-----------+----------+--------------+ POP      Full           Yes      Yes                                 +---------+---------------+---------+-----------+----------+--------------+ PTV      Full                                                        +---------+---------------+---------+-----------+----------+--------------+ PERO                                                  Not visualized +---------+---------------+---------+-----------+----------+--------------+  Left Venous Findings: +---+---------------+---------+-----------+----------+-------+    CompressibilityPhasicitySpontaneityPropertiesSummary +---+---------------+---------+-----------+----------+-------+ CFVFull           Yes      Yes                          +---+---------------+---------+-----------+----------+-------+    Summary: Right: There is no evidence of deep vein thrombosis in the lower extremity. A cystic structure is found in the popliteal fossa. Left: No evidence of common femoral vein obstruction.  *See table(s) above for measurements and observations.    Preliminary     Labs Reviewed  CBC WITH DIFFERENTIAL/PLATELET - Abnormal; Notable for the following components:  Result Value   WBC 15.4 (*)    Hemoglobin 11.5 (*)    MCH 25.8 (*)    RDW 19.1 (*)    Platelets 460 (*)    Neutro Abs 10.6 (*)    All other components within normal limits  BASIC METABOLIC PANEL - Abnormal; Notable for the following components:   Potassium 3.3 (*)    Glucose, Bld 101 (*)    BUN <5 (*)    Calcium 8.6 (*)    All other components within normal limits     MDM  Patient presents today for evaluation of an asthma exacerbation.  I assumed care of patient from previous team at shift change, please see their note for  full H&P.  Briefly plan is to follow-up on labs, DVT study, reevaluation and admit.  Labs are obtained and reviewed, potassium is slightly low at 3.3, will order p.o. replacement.  Her white count is elevated at 15.4.  Given her history of requiring intubation and ECMO with still slight increased work of breathing and mild tachypnea will consult hospitalist for admission.  I spoke with the hospitalist who agreed to admit patient.     Lorin Glass, [place] 05/13/18 1706   Addendum 5:20 PM I spoke with hospitalist who states that patient does not need to be admitted.  He requests that I discharge her home.  Will discharge her home with prescriptions for prednisone and refills of her albuterol nebulizers and inhalers.     Lorin Glass, PA-C 05/13/18 1732    Valarie Merino, MD 05/13/18 [phone]

## 2018-05-13 NOTE — ED Notes (Signed)
Pt is resting and appears comfortable.  1-hour neb tx is finished.  She reports breathing a little better.  She also endorses generalized body aches, more severe in her legs bilaterally.  She was able to move to the side of the stretcher to eat and drink.

## 2018-05-13 NOTE — ED Triage Notes (Signed)
No response when called to triage.

## 2018-05-13 NOTE — ED Notes (Signed)
Called x2 for triage with no answer.

## 2018-05-14 IMAGING — CR DG CHEST 2V
2 series · 2 of 2 positions shown · non-contrast
Comparison: PA and lateral chest x-ray April 27, 2017

CLINICAL DATA: Asthma exacerbation for the past week with onset of
shortness of breath today.

EXAM:
CHEST  2 VIEW

[chest pa]
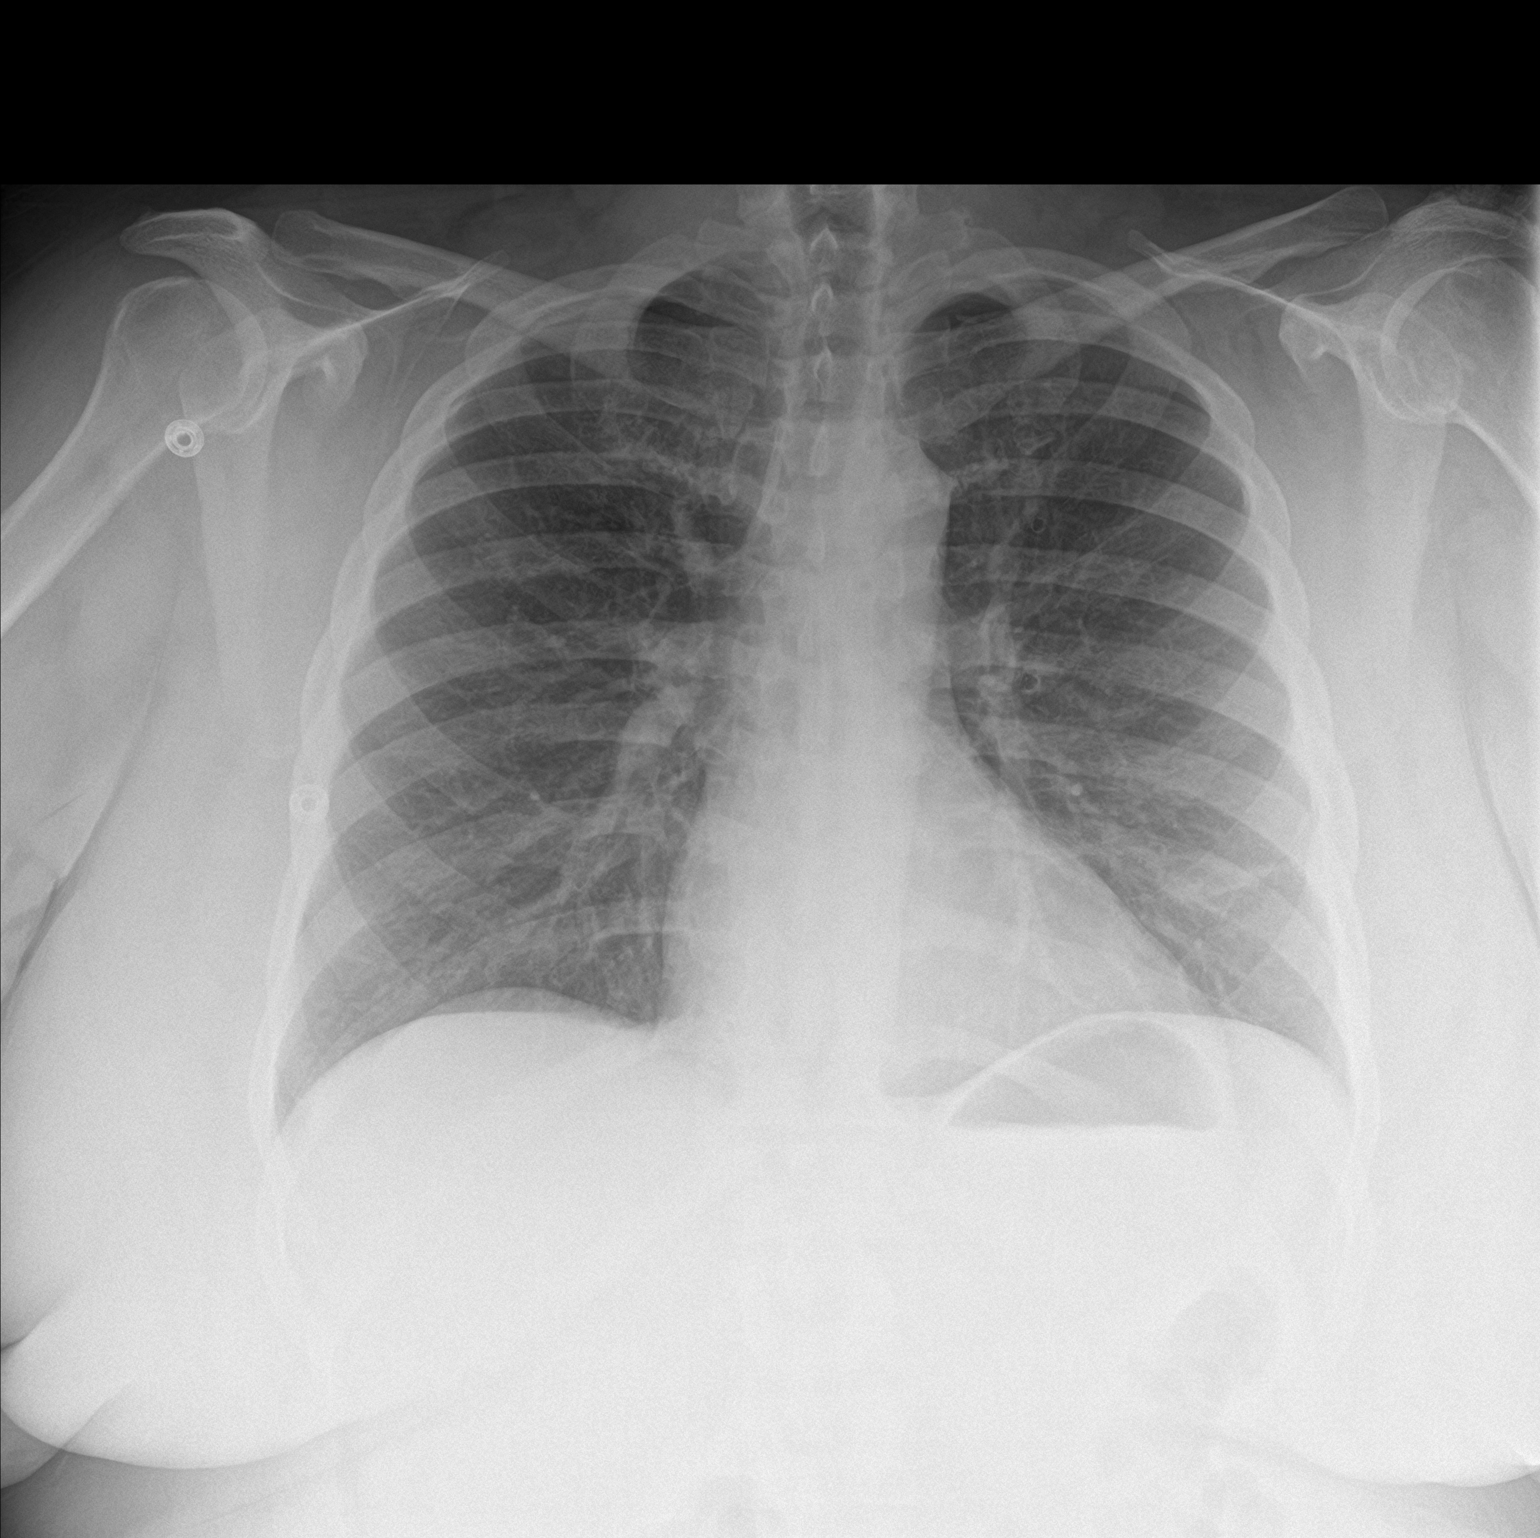

[chest lat]
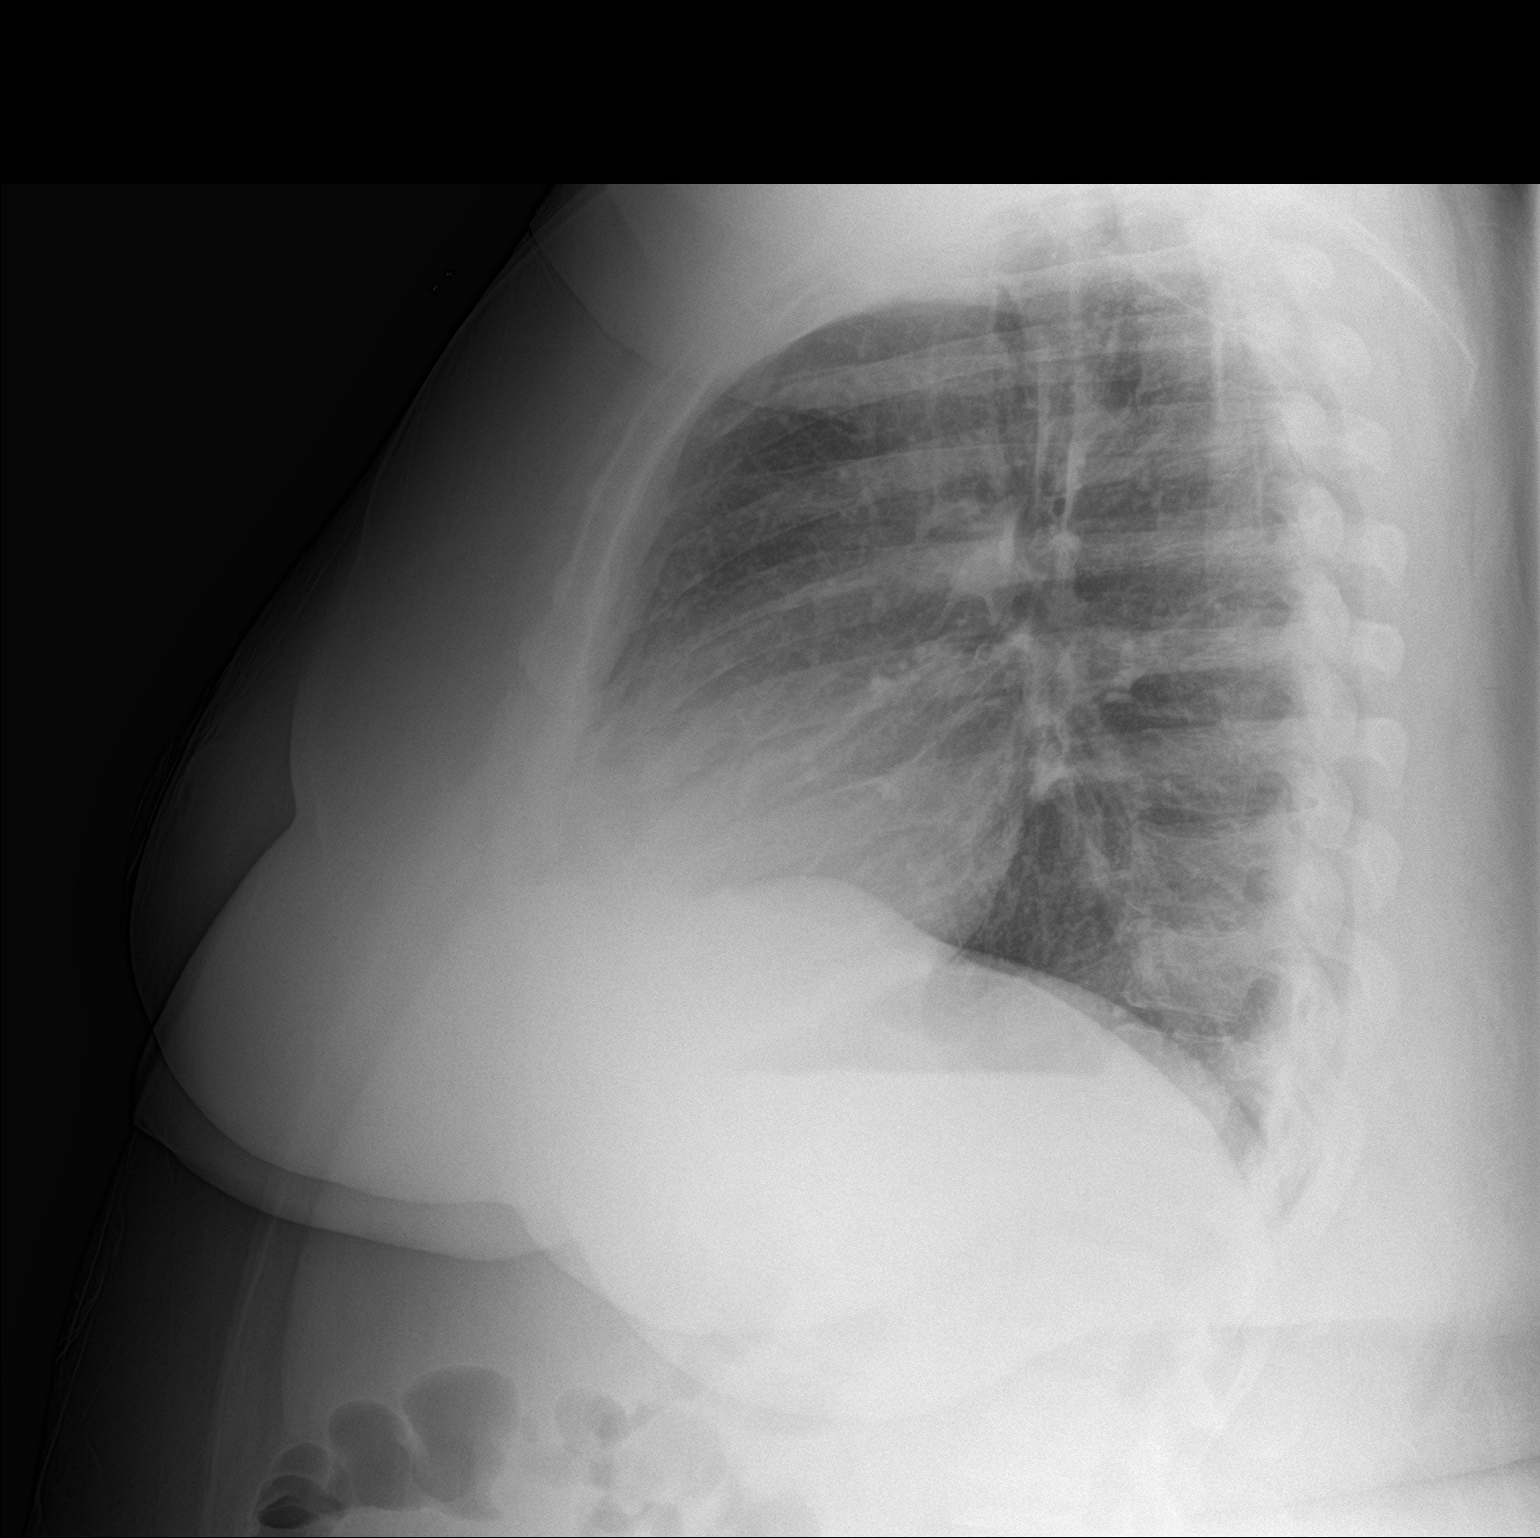

[2 of 2 positions shown; findings below may reference images not displayed]

FINDINGS: The lungs are adequately inflated. There is new increased density
lateral to the left heart border. There is no pleural effusion or
pneumothorax. The heart and pulmonary vascularity are normal. The
mediastinum is normal in width. The bony thorax is unremarkable.
IMPRESSION: New patchy increased density at the left lung base likely
posteriorly consistent with atelectasis or early pneumonia. Followup
PA and lateral chest X-ray is recommended in 3-4 weeks following
trial of antibiotic therapy to ensure resolution and exclude
underlying malignancy.

## 2018-05-15 ENCOUNTER — Inpatient Hospital Stay (HOSPITAL_COMMUNITY)
Admission: EM | Admit: 2018-05-15 | Discharge: 2018-05-20 | DRG: 202 | Discharge status: Against Medical Advice | Payer: BLUE CROSS/BLUE SHIELD | Attending: Internal Medicine | Admitting: Internal Medicine

## 2018-05-15 DIAGNOSIS — Z888 Allergy status to other drugs, medicaments and biological substances status: Secondary | ICD-10-CM

## 2018-05-15 DIAGNOSIS — I5032 Chronic diastolic (congestive) heart failure: Secondary | ICD-10-CM

## 2018-05-15 DIAGNOSIS — Z7952 Long term (current) use of systemic steroids: Secondary | ICD-10-CM

## 2018-05-15 DIAGNOSIS — Z9989 Dependence on other enabling machines and devices: Secondary | ICD-10-CM

## 2018-05-15 DIAGNOSIS — J189 Pneumonia, unspecified organism: Secondary | ICD-10-CM | POA: Diagnosis present

## 2018-05-15 DIAGNOSIS — I110 Hypertensive heart disease with heart failure: Secondary | ICD-10-CM | POA: Diagnosis present

## 2018-05-15 DIAGNOSIS — F329 Major depressive disorder, single episode, unspecified: Secondary | ICD-10-CM | POA: Diagnosis present

## 2018-05-15 DIAGNOSIS — D649 Anemia, unspecified: Secondary | ICD-10-CM | POA: Diagnosis present

## 2018-05-15 DIAGNOSIS — R739 Hyperglycemia, unspecified: Secondary | ICD-10-CM | POA: Diagnosis present

## 2018-05-15 DIAGNOSIS — K219 Gastro-esophageal reflux disease without esophagitis: Secondary | ICD-10-CM | POA: Diagnosis present

## 2018-05-15 DIAGNOSIS — R0602 Shortness of breath: Secondary | ICD-10-CM

## 2018-05-15 DIAGNOSIS — Z87891 Personal history of nicotine dependence: Secondary | ICD-10-CM

## 2018-05-15 DIAGNOSIS — F419 Anxiety disorder, unspecified: Secondary | ICD-10-CM | POA: Diagnosis present

## 2018-05-15 DIAGNOSIS — I480 Paroxysmal atrial fibrillation: Secondary | ICD-10-CM | POA: Diagnosis present

## 2018-05-15 DIAGNOSIS — T380X5A Adverse effect of glucocorticoids and synthetic analogues, initial encounter: Secondary | ICD-10-CM | POA: Diagnosis present

## 2018-05-15 DIAGNOSIS — I5033 Acute on chronic diastolic (congestive) heart failure: Secondary | ICD-10-CM | POA: Diagnosis present

## 2018-05-15 DIAGNOSIS — Z7951 Long term (current) use of inhaled steroids: Secondary | ICD-10-CM

## 2018-05-15 DIAGNOSIS — Z9111 Patient's noncompliance with dietary regimen: Secondary | ICD-10-CM

## 2018-05-15 DIAGNOSIS — Z885 Allergy status to narcotic agent status: Secondary | ICD-10-CM

## 2018-05-15 DIAGNOSIS — J440 Chronic obstructive pulmonary disease with acute lower respiratory infection: Secondary | ICD-10-CM | POA: Diagnosis present

## 2018-05-15 DIAGNOSIS — J45901 Unspecified asthma with (acute) exacerbation: Secondary | ICD-10-CM | POA: Diagnosis present

## 2018-05-15 DIAGNOSIS — Z9119 Patient's noncompliance with other medical treatment and regimen: Secondary | ICD-10-CM

## 2018-05-15 DIAGNOSIS — G8929 Other chronic pain: Secondary | ICD-10-CM | POA: Diagnosis present

## 2018-05-15 DIAGNOSIS — Z7901 Long term (current) use of anticoagulants: Secondary | ICD-10-CM

## 2018-05-15 DIAGNOSIS — E876 Hypokalemia: Secondary | ICD-10-CM | POA: Diagnosis present

## 2018-05-15 DIAGNOSIS — T501X5A Adverse effect of loop [high-ceiling] diuretics, initial encounter: Secondary | ICD-10-CM | POA: Diagnosis present

## 2018-05-15 DIAGNOSIS — J4541 Moderate persistent asthma with (acute) exacerbation: Principal | ICD-10-CM | POA: Diagnosis present

## 2018-05-15 DIAGNOSIS — G4733 Obstructive sleep apnea (adult) (pediatric): Secondary | ICD-10-CM | POA: Diagnosis present

## 2018-05-15 DIAGNOSIS — D473 Essential (hemorrhagic) thrombocythemia: Secondary | ICD-10-CM | POA: Diagnosis present

## 2018-05-15 DIAGNOSIS — Z91041 Radiographic dye allergy status: Secondary | ICD-10-CM

## 2018-05-15 DIAGNOSIS — Z79899 Other long term (current) drug therapy: Secondary | ICD-10-CM

## 2018-05-15 DIAGNOSIS — Z6841 Body Mass Index (BMI) 40.0 and over, adult: Secondary | ICD-10-CM

## 2018-05-16 ENCOUNTER — Encounter (HOSPITAL_COMMUNITY): Payer: Self-pay | Admitting: Family Medicine

## 2018-05-16 ENCOUNTER — Emergency Department (HOSPITAL_COMMUNITY): Payer: BLUE CROSS/BLUE SHIELD

## 2018-05-16 DIAGNOSIS — Z9989 Dependence on other enabling machines and devices: Secondary | ICD-10-CM

## 2018-05-16 DIAGNOSIS — I480 Paroxysmal atrial fibrillation: Secondary | ICD-10-CM

## 2018-05-16 DIAGNOSIS — J45901 Unspecified asthma with (acute) exacerbation: Secondary | ICD-10-CM | POA: Diagnosis present

## 2018-05-16 DIAGNOSIS — J4541 Moderate persistent asthma with (acute) exacerbation: Principal | ICD-10-CM

## 2018-05-16 DIAGNOSIS — G4733 Obstructive sleep apnea (adult) (pediatric): Secondary | ICD-10-CM

## 2018-05-16 DIAGNOSIS — I5032 Chronic diastolic (congestive) heart failure: Secondary | ICD-10-CM

## 2018-05-16 LAB — RESPIRATORY PANEL BY PCR
Adenovirus: NOT DETECTED
Bordetella pertussis: NOT DETECTED
Chlamydophila pneumoniae: NOT DETECTED
Coronavirus 229E: NOT DETECTED
Coronavirus HKU1: NOT DETECTED
Coronavirus NL63: NOT DETECTED
Coronavirus OC43: NOT DETECTED
Influenza A: NOT DETECTED
Influenza B: NOT DETECTED
METAPNEUMOVIRUS-RVPPCR: NOT DETECTED
Mycoplasma pneumoniae: NOT DETECTED
PARAINFLUENZA VIRUS 2-RVPPCR: NOT DETECTED
Parainfluenza Virus 1: NOT DETECTED
Parainfluenza Virus 3: NOT DETECTED
Parainfluenza Virus 4: NOT DETECTED
Respiratory Syncytial Virus: NOT DETECTED
Rhinovirus / Enterovirus: NOT DETECTED

## 2018-05-16 LAB — CBC WITH DIFFERENTIAL/PLATELET
Abs Immature Granulocytes: 0.07 10*3/uL (ref 0.00–0.07)
Basophils Absolute: 0 10*3/uL (ref 0.0–0.1)
Basophils Relative: 0 %
EOS PCT: 0 %
Eosinophils Absolute: 0 10*3/uL (ref 0.0–0.5)
HEMATOCRIT: 38.7 % (ref 36.0–46.0)
Hemoglobin: 11.8 g/dL — ABNORMAL LOW (ref 12.0–15.0)
Immature Granulocytes: 1 %
Lymphocytes Relative: 9 %
Lymphs Abs: 1.3 10*3/uL (ref 0.7–4.0)
MCH: 25.2 pg — ABNORMAL LOW (ref 26.0–34.0)
MCHC: 30.5 g/dL (ref 30.0–36.0)
MCV: 82.5 fL (ref 80.0–100.0)
MONO ABS: 0.3 10*3/uL (ref 0.1–1.0)
Monocytes Relative: 2 %
Neutro Abs: 13 10*3/uL — ABNORMAL HIGH (ref 1.7–7.7)
Neutrophils Relative %: 88 %
Platelets: 475 10*3/uL — ABNORMAL HIGH (ref 150–400)
RBC: 4.69 MIL/uL (ref 3.87–5.11)
RDW: 19.1 % — ABNORMAL HIGH (ref 11.5–15.5)
WBC: 14.7 10*3/uL — ABNORMAL HIGH (ref 4.0–10.5)
nRBC: 0 % (ref 0.0–0.2)

## 2018-05-16 LAB — GLUCOSE, CAPILLARY
Glucose-Capillary: 129 mg/dL — ABNORMAL HIGH (ref 70–99)
Glucose-Capillary: 150 mg/dL — ABNORMAL HIGH (ref 70–99)

## 2018-05-16 LAB — BASIC METABOLIC PANEL
Anion gap: 11 (ref 5–15)
BUN: 10 mg/dL (ref 6–20)
CO2: 20 mmol/L — AB (ref 22–32)
Calcium: 8.8 mg/dL — ABNORMAL LOW (ref 8.9–10.3)
Chloride: 104 mmol/L (ref 98–111)
Creatinine, Ser: 0.66 mg/dL (ref 0.44–1.00)
GFR calc Af Amer: >60 mL/min (ref >60)
GFR calc non Af Amer: >60 mL/min (ref >60)
Glucose, Bld: 167 mg/dL — ABNORMAL HIGH (ref 70–99)
Potassium: 3.9 mmol/L (ref 3.5–5.1)
Sodium: 135 mmol/L (ref 135–145)

## 2018-05-16 LAB — STREP PNEUMONIAE URINARY ANTIGEN: Strep Pneumo Urinary Antigen: NEGATIVE

## 2018-05-16 MED ORDER — SODIUM CHLORIDE 0.9 % IV SOLN
500.0000 mg | INTRAVENOUS | Status: DC
  Administered 2018-05-16 – 2018-05-17 (×2): 500 mg via INTRAVENOUS
  Filled 2018-05-16 (×2): qty 500

## 2018-05-16 MED ORDER — GUAIFENESIN ER 600 MG PO TB12
1200.0000 mg | ORAL_TABLET | Freq: Two times a day (BID) | ORAL | Status: DC
  Administered 2018-05-16 – 2018-05-20 (×9): 1200 mg via ORAL
  Filled 2018-05-16 (×9): qty 2

## 2018-05-16 MED ORDER — IPRATROPIUM BROMIDE 0.02 % IN SOLN
0.5000 mg | Freq: Four times a day (QID) | RESPIRATORY_TRACT | Status: DC
  Administered 2018-05-16 (×2): 0.5 mg via RESPIRATORY_TRACT
  Filled 2018-05-16 (×2): qty 2.5

## 2018-05-16 MED ORDER — ALBUTEROL SULFATE (2.5 MG/3ML) 0.083% IN NEBU
5.0000 mg | INHALATION_SOLUTION | Freq: Once | RESPIRATORY_TRACT | Status: AC
  Administered 2018-05-16: 5 mg via RESPIRATORY_TRACT
  Filled 2018-05-16: qty 6

## 2018-05-16 MED ORDER — ZOLPIDEM TARTRATE 5 MG PO TABS
5.0000 mg | ORAL_TABLET | Freq: Every evening | ORAL | Status: DC | PRN
  Administered 2018-05-16 – 2018-05-19 (×4): 5 mg via ORAL
  Filled 2018-05-16 (×4): qty 1

## 2018-05-16 MED ORDER — NICOTINE 14 MG/24HR TD PT24: 14.0000 mg | MEDICATED_PATCH | Freq: Every day | TRANSDERMAL | Status: DC

## 2018-05-16 MED ORDER — ONDANSETRON HCL 4 MG PO TABS: 4.0000 mg | ORAL_TABLET | Freq: Four times a day (QID) | ORAL | Status: DC | PRN

## 2018-05-16 MED ORDER — MOMETASONE FURO-FORMOTEROL FUM 200-5 MCG/ACT IN AERO
2.0000 | INHALATION_SPRAY | Freq: Two times a day (BID) | RESPIRATORY_TRACT | Status: DC
  Administered 2018-05-16: 2 via RESPIRATORY_TRACT
  Filled 2018-05-16: qty 8.8

## 2018-05-16 MED ORDER — IPRATROPIUM-ALBUTEROL 0.5-2.5 (3) MG/3ML IN SOLN: 3.0000 mL | Freq: Two times a day (BID) | RESPIRATORY_TRACT | Status: DC

## 2018-05-16 MED ORDER — INSULIN ASPART 100 UNIT/ML ~~LOC~~ SOLN: 0.0000 [IU] | Freq: Every day | SUBCUTANEOUS | Status: DC

## 2018-05-16 MED ORDER — LEVALBUTEROL HCL 0.63 MG/3ML IN NEBU
0.6300 mg | INHALATION_SOLUTION | Freq: Four times a day (QID) | RESPIRATORY_TRACT | Status: DC
  Administered 2018-05-16: 0.63 mg via RESPIRATORY_TRACT
  Filled 2018-05-16: qty 3

## 2018-05-16 MED ORDER — FUROSEMIDE 20 MG PO TABS
40.0000 mg | ORAL_TABLET | Freq: Every day | ORAL | Status: DC
  Administered 2018-05-16: 40 mg via ORAL
  Filled 2018-05-16: qty 2

## 2018-05-16 MED ORDER — APIXABAN 5 MG PO TABS
5.0000 mg | ORAL_TABLET | Freq: Two times a day (BID) | ORAL | Status: DC
  Administered 2018-05-16 – 2018-05-20 (×9): 5 mg via ORAL
  Filled 2018-05-16 (×9): qty 1

## 2018-05-16 MED ORDER — SODIUM CHLORIDE 0.9% FLUSH
3.0000 mL | Freq: Two times a day (BID) | INTRAVENOUS | Status: DC
  Administered 2018-05-17 – 2018-05-19 (×2): 3 mL via INTRAVENOUS

## 2018-05-16 MED ORDER — GABAPENTIN 300 MG PO CAPS
300.0000 mg | ORAL_CAPSULE | Freq: Two times a day (BID) | ORAL | Status: DC
  Administered 2018-05-16 – 2018-05-20 (×9): 300 mg via ORAL
  Filled 2018-05-16 (×9): qty 1

## 2018-05-16 MED ORDER — INSULIN ASPART 100 UNIT/ML ~~LOC~~ SOLN
0.0000 [IU] | Freq: Three times a day (TID) | SUBCUTANEOUS | Status: DC
  Administered 2018-05-16: 3 [IU] via SUBCUTANEOUS
  Administered 2018-05-16: 1 [IU] via SUBCUTANEOUS
  Administered 2018-05-16 – 2018-05-17 (×3): 2 [IU] via SUBCUTANEOUS
  Administered 2018-05-18: 1 [IU] via SUBCUTANEOUS
  Administered 2018-05-18 – 2018-05-19 (×2): 2 [IU] via SUBCUTANEOUS
  Administered 2018-05-19: 1 [IU] via SUBCUTANEOUS
  Administered 2018-05-19: 2 [IU] via SUBCUTANEOUS
  Administered 2018-05-20: 1 [IU] via SUBCUTANEOUS
  Administered 2018-05-20: 2 [IU] via SUBCUTANEOUS

## 2018-05-16 MED ORDER — SODIUM CHLORIDE 0.9% FLUSH: 3.0000 mL | INTRAVENOUS | Status: DC | PRN

## 2018-05-16 MED ORDER — SODIUM CHLORIDE 0.9% FLUSH
3.0000 mL | Freq: Two times a day (BID) | INTRAVENOUS | Status: DC
  Administered 2018-05-16 – 2018-05-19 (×4): 3 mL via INTRAVENOUS

## 2018-05-16 MED ORDER — PANTOPRAZOLE SODIUM 40 MG PO TBEC
40.0000 mg | DELAYED_RELEASE_TABLET | Freq: Every day | ORAL | Status: DC
  Administered 2018-05-16 – 2018-05-20 (×5): 40 mg via ORAL
  Filled 2018-05-16 (×5): qty 1

## 2018-05-16 MED ORDER — NICOTINE 21 MG/24HR TD PT24
21.0000 mg | MEDICATED_PATCH | Freq: Every day | TRANSDERMAL | Status: DC
  Administered 2018-05-16 – 2018-05-20 (×5): 21 mg via TRANSDERMAL
  Filled 2018-05-16 (×5): qty 1

## 2018-05-16 MED ORDER — ACETAMINOPHEN 325 MG PO TABS
650.0000 mg | ORAL_TABLET | Freq: Four times a day (QID) | ORAL | Status: DC | PRN
  Administered 2018-05-16 – 2018-05-19 (×7): 650 mg via ORAL
  Filled 2018-05-16 (×7): qty 2

## 2018-05-16 MED ORDER — ALBUTEROL SULFATE (2.5 MG/3ML) 0.083% IN NEBU
2.5000 mg | INHALATION_SOLUTION | RESPIRATORY_TRACT | Status: DC | PRN
  Administered 2018-05-18 – 2018-05-20 (×3): 2.5 mg via RESPIRATORY_TRACT
  Filled 2018-05-16 (×3): qty 3

## 2018-05-16 MED ORDER — METHYLPREDNISOLONE SODIUM SUCC 125 MG IJ SOLR
125.0000 mg | Freq: Once | INTRAMUSCULAR | Status: AC
  Administered 2018-05-16: 125 mg via INTRAVENOUS
  Filled 2018-05-16: qty 2

## 2018-05-16 MED ORDER — ACETAMINOPHEN 650 MG RE SUPP: 650.0000 mg | Freq: Four times a day (QID) | RECTAL | Status: DC | PRN

## 2018-05-16 MED ORDER — ONDANSETRON HCL 4 MG/2ML IJ SOLN: 4.0000 mg | Freq: Four times a day (QID) | INTRAMUSCULAR | Status: DC | PRN

## 2018-05-16 MED ORDER — ALBUTEROL (5 MG/ML) CONTINUOUS INHALATION SOLN
10.0000 mg/h | INHALATION_SOLUTION | RESPIRATORY_TRACT | Status: DC
  Administered 2018-05-16: 10 mg/h via RESPIRATORY_TRACT

## 2018-05-16 MED ORDER — ALBUTEROL (5 MG/ML) CONTINUOUS INHALATION SOLN
10.0000 mg/h | INHALATION_SOLUTION | RESPIRATORY_TRACT | Status: DC
  Administered 2018-05-16: 10 mg/h via RESPIRATORY_TRACT
  Filled 2018-05-16: qty 20

## 2018-05-16 MED ORDER — HYDROCODONE-ACETAMINOPHEN 10-325 MG PO TABS
1.0000 | ORAL_TABLET | Freq: Four times a day (QID) | ORAL | Status: DC | PRN
  Administered 2018-05-16 – 2018-05-20 (×15): 1 via ORAL
  Filled 2018-05-16 (×15): qty 1

## 2018-05-16 MED ORDER — POLYETHYLENE GLYCOL 3350 17 G PO PACK: 17.0000 g | PACK | Freq: Every day | ORAL | Status: DC | PRN

## 2018-05-16 MED ORDER — MELATONIN 3 MG PO TABS
1.0000 | ORAL_TABLET | Freq: Every day | ORAL | Status: DC
  Administered 2018-05-16 – 2018-05-19 (×4): 3 mg via ORAL
  Filled 2018-05-16 (×4): qty 1

## 2018-05-16 MED ORDER — MAGNESIUM SULFATE 2 GM/50ML IV SOLN
2.0000 g | Freq: Once | INTRAVENOUS | Status: AC
  Administered 2018-05-16: 2 g via INTRAVENOUS
  Filled 2018-05-16: qty 50

## 2018-05-16 MED ORDER — METHYLPREDNISOLONE SODIUM SUCC 125 MG IJ SOLR
60.0000 mg | Freq: Four times a day (QID) | INTRAMUSCULAR | Status: DC
  Administered 2018-05-16 – 2018-05-19 (×12): 60 mg via INTRAVENOUS
  Filled 2018-05-16 (×11): qty 2

## 2018-05-16 MED ORDER — IPRATROPIUM BROMIDE 0.02 % IN SOLN
0.5000 mg | RESPIRATORY_TRACT | Status: DC
  Administered 2018-05-16 – 2018-05-17 (×8): 0.5 mg via RESPIRATORY_TRACT
  Filled 2018-05-16 (×8): qty 2.5

## 2018-05-16 MED ORDER — ARFORMOTEROL TARTRATE 15 MCG/2ML IN NEBU
15.0000 ug | INHALATION_SOLUTION | Freq: Two times a day (BID) | RESPIRATORY_TRACT | Status: DC
  Administered 2018-05-16 – 2018-05-20 (×8): 15 ug via RESPIRATORY_TRACT
  Filled 2018-05-16 (×8): qty 2

## 2018-05-16 MED ORDER — BUDESONIDE 0.25 MG/2ML IN SUSP
0.2500 mg | Freq: Two times a day (BID) | RESPIRATORY_TRACT | Status: DC
  Administered 2018-05-16 – 2018-05-20 (×8): 0.25 mg via RESPIRATORY_TRACT
  Filled 2018-05-16 (×9): qty 2

## 2018-05-16 MED ORDER — LEVALBUTEROL HCL 1.25 MG/0.5ML IN NEBU
1.2500 mg | INHALATION_SOLUTION | Freq: Four times a day (QID) | RESPIRATORY_TRACT | Status: DC
  Administered 2018-05-16: 1.25 mg via RESPIRATORY_TRACT
  Filled 2018-05-16: qty 0.5

## 2018-05-16 MED ORDER — IPRATROPIUM BROMIDE 0.02 % IN SOLN
0.5000 mg | Freq: Once | RESPIRATORY_TRACT | Status: AC
  Administered 2018-05-16: 0.5 mg via RESPIRATORY_TRACT
  Filled 2018-05-16: qty 2.5

## 2018-05-16 MED ORDER — SODIUM CHLORIDE 0.9 % IV SOLN: 250.0000 mL | INTRAVENOUS | Status: DC | PRN

## 2018-05-16 MED ORDER — MONTELUKAST SODIUM 10 MG PO TABS
10.0000 mg | ORAL_TABLET | Freq: Every day | ORAL | Status: DC
  Administered 2018-05-16 – 2018-05-19 (×4): 10 mg via ORAL
  Filled 2018-05-16 (×4): qty 1

## 2018-05-16 MED ORDER — SODIUM CHLORIDE 0.9 % IV SOLN
1.0000 g | INTRAVENOUS | Status: DC
  Administered 2018-05-16 – 2018-05-20 (×5): 1 g via INTRAVENOUS
  Filled 2018-05-16 (×5): qty 10

## 2018-05-16 MED ORDER — LEVALBUTEROL HCL 1.25 MG/0.5ML IN NEBU
1.2500 mg | INHALATION_SOLUTION | RESPIRATORY_TRACT | Status: DC
  Administered 2018-05-16 – 2018-05-17 (×8): 1.25 mg via RESPIRATORY_TRACT
  Filled 2018-05-16 (×8): qty 0.5

## 2018-05-16 NOTE — ED Notes (Signed)
Placed in recliner chair for comfort. 

## 2018-05-16 NOTE — ED Notes (Signed)
Patient ambulated to bathroom unassisted but remains short of breath with exertion.

## 2018-05-16 NOTE — Progress Notes (Signed)
   05/16/18 1400  Clinical Encounter Type  Visited With Patient  Visit Type Initial  Responded to [facility] consult for prayer. Patient was sitting up in bed. Provided [place] through prayer and social support.

## 2018-05-16 NOTE — ED Notes (Signed)
RT notified for continuous neb

## 2018-05-16 NOTE — ED Triage Notes (Signed)
Pt reports her asthma is acting up

## 2018-05-16 NOTE — ED Notes (Signed)
Patient removed all cardiac monitoring/vital sign equipment.

## 2018-05-16 NOTE — H&P (Signed)
History and Physical    April [name] CNO:[id number] DOB: 1971/02/19 DOA: 05/15/2018  PCP: [name], MD   Patient coming from: Home   Chief Complaint: SOB, cough   HPI: STAPHANIE [name] is a 48 y.o. female with medical history significant for persistent asthma with history of intubation, history of ARDS, paroxysmal atrial fibrillation on Eliquis, chronic diastolic CHF, OSA on CPAP, and now presenting to the emergency department for evaluation of shortness of breath and cough.  Patient was seen for the same complaints on 05/13/2018, improved with nebs and steroids in the ED, and was able to go home at that time.  She did well at home initially, but slowly began to worsen again.  Home breathing treatments were no longer providing much relief, she was short of breath at rest, and eventually came back in for evaluation. Cough has been non-productive. She denies chest pain. Denies leg swelling or tenderness. No abdominal pain.  ED Course: Upon arrival to the ED, patient is found to be afebrile, saturating mid 90s on room air, tachypneic up into the 40s, and with vitals otherwise stable.  EKG features sinus tachycardia with rate 106 and T wave abnormalities that appear similar to prior.  Chest x-ray is notable for hazy opacity in the lingula, suggestive of atelectasis versus mild pneumonia.  Chemistry panel features a bicarbonate of 20 and glucose 167.  CBC is notable for a chronic leukocytosis and chronic thrombocytosis.  Patient was treated with continuous albuterol nebs, Atrovent, and 2 g IV magnesium in the ED.  She remains dyspneic at rest and will be observed for further evaluation and management of acute exacerbation and asthma, possibly precipitated by pneumonia.  Review of Systems:  All other systems reviewed and apart from HPI, are negative.  Past Medical History:  Diagnosis Date  . Acanthosis nigricans   . Anxiety   . Arthritis    "knees" (04/28/2017)  . Asthma    Followed by Dr. Annamaria Boots  (pulmonology); receives every other week omalizumab injections; has frequent exacerbations  . Chronic diastolic CHF (congestive heart failure) ([place]) 01/17/2017  . COPD (chronic obstructive pulmonary disease) ([place])    PFTs in 2002, FEV1/FVC 65, no post bronchodilater test done  . Depression   . GERD (gastroesophageal reflux disease)   . Headache(784.0)    "q couple days" (04/28/2017)  . Helicobacter pylori (H. pylori) infection   . Hypertension, essential   . Insomnia   . Menorrhagia   . Morbid obesity ([place])   . OSA on CPAP    Sleep study 2008 - mild OSA, not enough events to titrate CPAP; wears CPAP now/pt on 04/28/2017  . Pneumonia X 1  . Seasonal allergies   . Shortness of breath   . Tobacco user     Past Surgical History:  Procedure Laterality Date  . CARDIOVERSION N/A 05/30/2017   Procedure: CARDIOVERSION;  Surgeon: Sanda Klein, MD;  Location: [place] [facility];  Service: Cardiovascular;  Laterality: N/A;  . REDUCTION MAMMAPLASTY Bilateral 09/2011  . TUBAL LIGATION  1996   bilateral     reports that she quit smoking about 3 years ago. Her smoking use included cigarettes. She has a 13.00 pack-year smoking history. She has never used smokeless tobacco. She reports that she does not drink alcohol or use drugs.  Allergies  Allergen Reactions  . Contrast Media [Iodinated Diagnostic Agents] Itching    Ct contrast  . Oxycodone-Acetaminophen Itching    Family History  Problem Relation Age of Onset  .  Hypertension Mother   . Asthma Daughter   . Cancer Paternal Aunt   . Asthma Maternal Grandmother      Prior to Admission medications   Medication Sig Start Date End Date Taking? Authorizing Provider  albuterol (PROVENTIL) (2.5 MG/3ML) 0.083% nebulizer solution Take 3 mLs (2.5 mg total) by nebulization every 6 (six) hours as needed for wheezing or shortness of breath. 05/13/18  Yes Lorin Glass, PA-C  apixaban (ELIQUIS) 5 MG TABS tablet Take 5 mg by mouth 2 (two) times  daily. 10/02/17   [provider]  cetirizine (ZYRTEC) 10 MG tablet Take 10 mg by mouth daily. 10/21/17   [provider]  Ferrous Sulfate (IRON) 325 (65 Fe) MG TABS Take 1 tablet (325 mg total) by mouth every morning. Patient taking differently: Take 1 tablet by mouth daily.  01/26/18   Argentina Donovan, PA-C  fluticasone (FLONASE) 50 MCG/ACT nasal spray Place 1 spray into both nostrils daily. 10/16/17   Sheikh, Omair Latif, DO  Fluticasone-Salmeterol (ADVAIR) 250-50 MCG/DOSE AEPB Inhale 1 puff into the lungs 2 (two) times daily. 10/16/17   [provider]  furosemide (LASIX) 40 MG tablet Take 40 mg by mouth daily. 10/02/17 10/02/18  [provider]  gabapentin (NEURONTIN) 300 MG capsule Take 1 capsule (300 mg total) by mouth 2 (two) times daily. 03/18/18   [name], MD  HYDROcodone-acetaminophen (NORCO) 10-325 MG tablet Take 1 tablet by mouth 4 (four) times daily as needed for moderate pain.  02/23/18   [provider]  ipratropium-albuterol (DUONEB) 0.5-2.5 (3) MG/3ML SOLN Inhale 3 ml two times daily and every 2 hours as needed for shortness of breath or wheezing Inhale 3 ml two times daily and every 2 hours as needed for shortness of breath or wheezing 05/13/18   Lorin Glass, PA-C  Melatonin 3 MG TABS Take 1 tablet by mouth at bedtime. 10/20/17   [provider]  montelukast (SINGULAIR) 10 MG tablet Take 10 mg by mouth at bedtime. 10/02/17   [provider]  omeprazole (PRILOSEC) 20 MG capsule TAKE 1 CAPSULE BY MOUTH EVERY DAY Patient taking differently: Take 20 mg by mouth daily.  02/20/18   Baird Lyons D, MD  potassium chloride SA (K-DUR,KLOR-CON) 20 MEQ tablet Take 1 tablet (20 mEq total) by mouth daily. 03/18/18   [name], MD  predniSONE (DELTASONE) 20 MG tablet Take 2 tablets (40 mg total) by mouth daily for 4 days. 05/13/18 05/17/18  Lorin Glass, PA-C  PROAIR HFA 108 [phone] Base) MCG/ACT inhaler Inhale 2 puffs  into the lungs every 4 (four) hours as needed for wheezing.  03/08/18   [provider]  zolpidem (AMBIEN) 5 MG tablet TAKE 1 TABLET(5 MG) BY MOUTH AT BEDTIME 03/16/18   [name], MD    Physical Exam: Vitals:   05/16/18 0148 05/16/18 0200 05/16/18 0352 05/16/18 0522  BP:  (!) 177/86  (!) 180/84  Pulse:  95  (!) 103  Resp:  (!) 43  (!) 28  Temp:      TempSrc:      SpO2: 99% 98% 95% 99%    Constitutional: Mild tachypnea, dyspnea with speech, calm  Eyes: PERTLA, lids and conjunctivae normal ENMT: Mucous membranes are moist. Posterior pharynx clear of any exudate or lesions.   Neck: normal, supple, no masses, no thyromegaly Respiratory: Breath sounds diminished bilaterally with prolonged expiratory phase, scattered wheezes. No pallor or cyanosis.  Cardiovascular: S1 & S2 heard, regular rate and rhythm. No  extremity edema.  Abdomen: No distension, no tenderness, soft. Bowel sounds active.  Musculoskeletal: no clubbing / cyanosis. No joint deformity upper and lower extremities.    Skin: no significant rashes, lesions, ulcers. Warm, dry, well-perfused. Neurologic: No facial asymmetry. Sensation intact. Moving all extremities.  Psychiatric: Alert and oriented x 3. Calm, cooperative.    Labs on Admission: I have personally reviewed following labs and imaging studies  CBC: Recent Labs  Lab 05/13/18 1412 05/16/18 0143  WBC 15.4* 14.7*  NEUTROABS 10.6* 13.0*  HGB 11.5* 11.8*  HCT 36.9 38.7  MCV 82.7 82.5  PLT 460* 578*   Basic Metabolic Panel: Recent Labs  Lab 05/13/18 1412 05/16/18 0143  NA 136 135  K 3.3* 3.9  CL 102 104  CO2 25 20*  GLUCOSE 101* 167*  BUN <5* 10  CREATININE 0.63 0.66  CALCIUM 8.6* 8.8*   GFR: CrCl cannot be calculated (Unknown ideal weight.). Liver Function Tests: No results for input(s): AST, ALT, ALKPHOS, BILITOT, PROT, ALBUMIN in the last 168 hours. No results for input(s): LIPASE, AMYLASE in the last 168 hours. No results for  input(s): AMMONIA in the last 168 hours. Coagulation Profile: No results for input(s): INR, PROTIME in the last 168 hours. Cardiac Enzymes: No results for input(s): CKTOTAL, CKMB, CKMBINDEX, TROPONINI in the last 168 hours. BNP (last 3 results) No results for input(s): PROBNP in the last 8760 hours. HbA1C: No results for input(s): HGBA1C in the last 72 hours. CBG: No results for input(s): GLUCAP in the last 168 hours. Lipid Profile: No results for input(s): CHOL, HDL, LDLCALC, TRIG, CHOLHDL, LDLDIRECT in the last 72 hours. Thyroid Function Tests: No results for input(s): TSH, T4TOTAL, FREET4, T3FREE, THYROIDAB in the last 72 hours. Anemia Panel: No results for input(s): VITAMINB12, FOLATE, FERRITIN, TIBC, IRON, RETICCTPCT in the last 72 hours. Urine analysis:    Component Value Date/Time   COLORURINE YELLOW 10/13/2017 1100   APPEARANCEUR CLEAR 10/13/2017 1100   LABSPEC 1.005 10/13/2017 1100   PHURINE 5.0 10/13/2017 1100   GLUCOSEU NEGATIVE 10/13/2017 1100   GLUCOSEU NEG mg/dL 10/28/2007 2049   HGBUR SMALL (A) 10/13/2017 1100   BILIRUBINUR NEGATIVE 10/13/2017 1100   KETONESUR NEGATIVE 10/13/2017 1100   PROTEINUR NEGATIVE 10/13/2017 1100   UROBILINOGEN 1.0 11/21/2014 0707   NITRITE NEGATIVE 10/13/2017 1100   LEUKOCYTESUR SMALL (A) 10/13/2017 1100   Sepsis Labs: @LABRCNTIP (procalcitonin:4,lacticidven:4) )No results found for this or any previous visit (from the past 240 hour(s)).   Radiological Exams on Admission: Dg Chest Port 1 View  Result Date: 05/16/2018 CLINICAL DATA:  Dyspnea, smoker EXAM: PORTABLE CHEST 1 VIEW COMPARISON:  05/13/2018 chest radiograph. FINDINGS: Stable cardiomediastinal silhouette with normal heart size. No pneumothorax. No pleural effusion. Hazy curvilinear opacities in the lingula silhouettes the left heart border, unchanged. No overt pulmonary edema. IMPRESSION: Stable hazy curvilinear opacities in the lingula silhouetting the left heart border,  suggesting atelectasis or mild pneumonia. Recommend follow-up PA and lateral post treatment chest radiographs in 4-6 weeks. Electronically Signed   By: Ilona Sorrel M.D.   On: 05/16/2018 04:20    EKG: Independently reviewed. Sinus tachycardia (rate 106), T-wave abnormalities similar to prior.   Assessment/Plan   1. Asthma with acute exacerbation; possible CAP   - Patient has persistent asthma with frequent exacerbations, presenting with worsening SOB, cough, dyspneic at rest  - She had improved initially with treatment in ED a couple days ago, but began to worsen again with diminishing relief from nebs at home  - CXR  concerning for possible mild PNA in lingula  - She was treated with continuous albuterol nebs, Atrovent, 2 g IV mag in ED but remains dyspneic at rest  - Check sputum culture, start Rocephin and azithromycin, start systemic steroid, continue ICS/LABA, continue scheduled duoneb and as-needed albuterol nebs    2. Paroxysmal atrial fibrillation  - In sinus rhythm on admission  - CHADS-VASc at least 2 (CHF, gender)  - Continue Eliquis    3. Chronic diastolic CHF  - Appears compensated  - Continue Lasix, daily weights    DVT prophylaxis: Eliquis  Code Status: Full  Family Communication: Discussed with patient Consults called: None Admission status: Observation     [name], MD [facility] Pager [phone]  If 7PM-7AM, please contact night-coverage [email or web address] Password [facility]  05/16/2018, 5:48 AM

## 2018-05-16 NOTE — ED Provider Notes (Signed)
[place] EMERGENCY DEPARTMENT Provider Note  CSN: [id number] Arrival date & time: 05/15/18 2358  Chief Complaint(s) Asthma  HPI April [name] is a 48 y.o. female with a history of asthma, COPD, chronic diastolic heart failure who presents to the emergency department with recurrent shortness of breath.  She was seen here several days ago for the same and found to have asthma exacerbation.  Consider for admission but discharged by hospitalist service due to significant improvement.  She returns today for worsening of her shortness of breath.  Reports that after being sent home, she did feel better for a few days but this morning her shortness of breath return and gradually worsened throughout the day.  She has tried doing her home breathing treatments with minimal relief.  She is endorsing significant dry cough.  Denies any fevers or chills.  No nausea vomiting.  No chest pain.  No peripheral edema.  Reports being compliant with her breathing treatments and steroids.  HPI  Past Medical History Past Medical History:  Diagnosis Date  . Acanthosis nigricans   . Anxiety   . Arthritis    "knees" (04/28/2017)  . Asthma    Followed by Dr. Annamaria Boots (pulmonology); receives every other week omalizumab injections; has frequent exacerbations  . Chronic diastolic CHF (congestive heart failure) ([place]) 01/17/2017  . COPD (chronic obstructive pulmonary disease) ([place])    PFTs in 2002, FEV1/FVC 65, no post bronchodilater test done  . Depression   . GERD (gastroesophageal reflux disease)   . Headache(784.0)    "q couple days" (04/28/2017)  . Helicobacter pylori (H. pylori) infection   . Hypertension, essential   . Insomnia   . Menorrhagia   . Morbid obesity ([place])   . OSA on CPAP    Sleep study 2008 - mild OSA, not enough events to titrate CPAP; wears CPAP now/pt on 04/28/2017  . Pneumonia X 1  . Seasonal allergies   . Shortness of breath   . Tobacco user    Patient Active Problem  List   Diagnosis Date Noted  . Urinary incontinence 11/17/2017  . Nausea with vomiting 11/06/2017  . Hypomagnesemia 10/27/2017  . Dysphagia 10/27/2017  . Physical deconditioning 10/25/2017  . Acute maxillary sinusitis 10/24/2017  . Emesis, persistent 10/08/2017  . Volume depletion 10/08/2017  . Pleuritic chest pain 10/08/2017  . SOB (shortness of breath) 10/08/2017  . Debility 09/22/2017  . HCAP (healthcare-associated pneumonia) 08/06/2017  . Chronic atrial fibrillation 07/29/2017  . COPD with acute exacerbation ([place]) 02/10/2017  . Dyspnea 02/04/2017  . Chronic diastolic CHF (congestive heart failure) ([place]) 01/17/2017  . Asthma exacerbation 01/17/2017  . Metabolic syndrome 60/73/7106  . Moderate persistent asthma with exacerbation 10/19/2016  . Normocytic anemia 10/02/2016  . Elevated hemoglobin A1c 05/05/2016  . GERD (gastroesophageal reflux disease) 08/30/2015  . Anxiety and depression 08/30/2015  . Generalized anxiety disorder 08/30/2015  . Seasonal allergic rhinitis 08/29/2013  . Leukocytosis 10/21/2012  . Tobacco abuse in remission 10/07/2012  . COPD mixed type ([place]) 05/07/2012  . Knee pain, bilateral 04/25/2011  . Primary insomnia 03/14/2011  . OSA on CPAP 12/19/2010  . Obstructive sleep apnea 12/19/2010  . Hypokalemia 08/13/2010  . Cervical back pain with evidence of disc disease 04/08/2008  . Essential hypertension 07/31/2006  . Morbid obesity with body mass index of 50.0-59.9 in adult ([place]) 06/17/2006  . Major depressive disorder, recurrent episode ([place]) 04/10/2006   Home Medication(s) Prior to Admission medications   Medication Sig Start Date End Date  Taking? Authorizing Provider  albuterol (PROVENTIL) (2.5 MG/3ML) 0.083% nebulizer solution Take 3 mLs (2.5 mg total) by nebulization every 6 (six) hours as needed for wheezing or shortness of breath. 05/13/18   Lorin Glass, PA-C  apixaban (ELIQUIS) 5 MG TABS tablet Take 5 mg by mouth 2 (two) times daily.  10/02/17   [provider]  cetirizine (ZYRTEC) 10 MG tablet Take 10 mg by mouth daily. 10/21/17   [provider]  Ferrous Sulfate (IRON) 325 (65 Fe) MG TABS Take 1 tablet (325 mg total) by mouth every morning. Patient taking differently: Take 1 tablet by mouth daily.  01/26/18   Argentina Donovan, PA-C  fluticasone (FLONASE) 50 MCG/ACT nasal spray Place 1 spray into both nostrils daily. 10/16/17   Sheikh, Omair Latif, DO  Fluticasone-Salmeterol (ADVAIR) 250-50 MCG/DOSE AEPB Inhale 1 puff into the lungs 2 (two) times daily. 10/16/17   [provider]  furosemide (LASIX) 40 MG tablet Take 40 mg by mouth daily. 10/02/17 10/02/18  [provider]  gabapentin (NEURONTIN) 300 MG capsule Take 1 capsule (300 mg total) by mouth 2 (two) times daily. 03/18/18   [name], MD  HYDROcodone-acetaminophen (NORCO) 10-325 MG tablet Take 1 tablet by mouth 4 (four) times daily as needed for moderate pain.  02/23/18   [provider]  ipratropium-albuterol (DUONEB) 0.5-2.5 (3) MG/3ML SOLN Inhale 3 ml two times daily and every 2 hours as needed for shortness of breath or wheezing Inhale 3 ml two times daily and every 2 hours as needed for shortness of breath or wheezing 05/13/18   Lorin Glass, PA-C  Melatonin 3 MG TABS Take 1 tablet by mouth at bedtime. 10/20/17   [provider]  montelukast (SINGULAIR) 10 MG tablet Take 10 mg by mouth at bedtime. 10/02/17   [provider]  omeprazole (PRILOSEC) 20 MG capsule TAKE 1 CAPSULE BY MOUTH EVERY DAY Patient taking differently: Take 20 mg by mouth daily.  02/20/18   Baird Lyons D, MD  potassium chloride SA (K-DUR,KLOR-CON) 20 MEQ tablet Take 1 tablet (20 mEq total) by mouth daily. 03/18/18   [name], MD  predniSONE (DELTASONE) 20 MG tablet Take 2 tablets (40 mg total) by mouth daily for 4 days. 05/13/18 05/17/18  Lorin Glass, PA-C  PROAIR HFA [phone] Base) MCG/ACT inhaler Inhale 2 puffs into  the lungs every 4 (four) hours as needed for wheezing.  03/08/18   [provider]  zolpidem (AMBIEN) 5 MG tablet TAKE 1 TABLET(5 MG) BY MOUTH AT BEDTIME 03/16/18   [name], MD                                                                                                                                    Past Surgical History Past Surgical History:  Procedure Laterality Date  . CARDIOVERSION N/A 05/30/2017   Procedure: CARDIOVERSION;  Surgeon: Sanda Klein, MD;  Location[facility]  ENDOSCOPY;  Service: Cardiovascular;  Laterality: N/A;  . REDUCTION MAMMAPLASTY Bilateral 09/2011  . TUBAL LIGATION  1996   bilateral   Family History Family History  Problem Relation Age of Onset  . Hypertension Mother   . Asthma Daughter   . Cancer Paternal Aunt   . Asthma Maternal Grandmother     Social History Social History   Tobacco Use  . Smoking status: Former Smoker    Packs/day: 0.50    Years: 26.00    Pack years: 13.00    Types: Cigarettes    Last attempt to quit: 09/12/2014    Years since quitting: 3.6  . Smokeless tobacco: Never Used  Substance Use Topics  . Alcohol use: No  . Drug use: No   Allergies Contrast media [iodinated diagnostic agents] and Oxycodone-acetaminophen  Review of Systems Review of Systems All other systems are reviewed and are negative for acute change except as noted in the HPI  Physical Exam Vital Signs  I have reviewed the triage vital signs BP (!) 177/86   Pulse 95   Temp 98.1 F (36.7 C) (Oral)   Resp (!) 43   LMP 04/20/2018   SpO2 95%   Physical Exam Vitals signs reviewed.  Constitutional:      General: She is not in acute distress.    Appearance: She is well-developed. She is not diaphoretic.     Comments: Obese  HENT:     Head: Normocephalic and atraumatic.     Nose: Nose normal.  Eyes:     General: No scleral icterus.       Right eye: No discharge.        Left eye: No discharge.     Conjunctiva/sclera: Conjunctivae  normal.     Pupils: Pupils are equal, round, and reactive to light.  Neck:     Musculoskeletal: Normal range of motion and neck supple.  Cardiovascular:     Rate and Rhythm: Normal rate and regular rhythm.     Heart sounds: No murmur. No friction rub. No gallop.   Pulmonary:     Effort: Tachypnea, accessory muscle usage and prolonged expiration present.     Breath sounds: Decreased air movement present. No stridor. Wheezing present. No rales.     Comments: Decreased air movement throughout with faint wheezes. Abdominal:     General: There is no distension.     Palpations: Abdomen is soft.     Tenderness: There is no abdominal tenderness.  Musculoskeletal:        General: No tenderness.  Skin:    General: Skin is warm and dry.     Findings: No erythema or rash.  Neurological:     Mental Status: She is alert and oriented to person, place, and time.     ED Results and Treatments Labs (all labs ordered are listed, but only abnormal results are displayed) Labs Reviewed  CBC WITH DIFFERENTIAL/PLATELET - Abnormal; Notable for the following components:      Result Value   WBC 14.7 (*)    Hemoglobin 11.8 (*)    MCH 25.2 (*)    RDW 19.1 (*)    Platelets 475 (*)    Neutro Abs 13.0 (*)    All other components within normal limits  BASIC METABOLIC PANEL - Abnormal; Notable for the following components:   CO2 20 (*)    Glucose, Bld 167 (*)    Calcium 8.8 (*)    All other components within normal limits  EKG  EKG Interpretation  Date/Time:  Saturday May 16 2018 00:07:49 EST Ventricular Rate:  106 PR Interval:  148 QRS Duration: 88 QT Interval:  358 QTC Calculation: 475 R Axis:   -18 Text Interpretation:  Sinus tachycardia Possible Left atrial enlargement Septal infarct , age undetermined Abnormal ECG No significant change since last tracing Confirmed by  Addison Lank [phone]) on 05/16/2018 4:02:09 AM      Radiology No results found. Pertinent labs & imaging results that were available during my care of the patient were reviewed by me and considered in my medical decision making (see chart for details).  Medications Ordered in ED Medications  albuterol (PROVENTIL,VENTOLIN) solution continuous neb (0 mg/hr Nebulization Stopped 05/16/18 0341)  albuterol (PROVENTIL,VENTOLIN) solution continuous neb (10 mg/hr Nebulization New Bag/Given 05/16/18 0349)  magnesium sulfate IVPB 2 g 50 mL (2 g Intravenous New Bag/Given 05/16/18 0351)  albuterol (PROVENTIL) (2.5 MG/3ML) 0.083% nebulizer solution 5 mg (5 mg Nebulization Given 05/16/18 0021)  ipratropium (ATROVENT) nebulizer solution 0.5 mg (0.5 mg Nebulization Given 05/16/18 0148)                                                                                                                                    Procedures Procedures  CRITICAL CARE Performed by: Grayce Sessions Maanvi Lecompte Total critical care time: 35 minutes Critical care time was exclusive of separately billable procedures and treating other patients. Critical care was necessary to treat or prevent imminent or life-threatening deterioration. Critical care was time spent personally by me on the following activities: development of treatment plan with patient and/or surrogate as well as nursing, discussions with consultants, evaluation of patient's response to treatment, examination of patient, obtaining history from patient or surrogate, ordering and performing treatments and interventions, ordering and review of laboratory studies, ordering and review of radiographic studies, pulse oximetry and re-evaluation of patient's condition.   (including critical care time)  Medical Decision Making / ED Course I have reviewed the nursing notes for this encounter and the patient's prior records (if available in EHR or on provided paperwork).    Patient  presented with shortness of breath.  Consistent with asthma/COPD exacerbation.  No evidence of volume overload on exam.  Chest x-ray with stable lingula infiltrate.  No fever and stable leukocytosis.  Not currently suspicious for pneumonia.  Patient treated with multiple breathing treatments and magnesium.  Patient still with increased work of breathing requiring admission for continued management.  Final Clinical Impression(s) / ED Diagnoses Final diagnoses:  SOB (shortness of breath)  Moderate persistent asthma with exacerbation      This chart was dictated using voice recognition software.  Despite best efforts to proofread,  errors can occur which can change the documentation meaning.   Fatima Blank, MD 05/16/18 [phone]

## 2018-05-16 NOTE — Progress Notes (Signed)
Patient was admitted early this morning after midnight and H&P has been reviewed and I am in current agreement with assessment and plan done by Dr. Mitzi Hansen.  Additional changes to the plan of care been made accordingly.  Patient is a 48 year old morbidly obese African-American female with a past medical history significant for persistent asthma with a history of intubation, history of ARDS, proximal atrial fibrillation on Eliquis, chronic diastolic CHF, history of OSA on CPAP along with other comorbidities GERD, hypertension, seasonal allergies, tobacco abuse, anxiety and depression who presents to the emergency room with a chief complaint of along with associated cough.  She was seen in the ED on 05/13/2018 for the same exact complaints and improved with nebulizers and steroids in the ED and was able to go home at that time.  Initially she did well however her breathing became worse again and her breathing treatments no longer provided much relief as she became short of breath at rest and she eventually came back to the emergency room early this morning for evaluation.  She is found to be saturating in the mid 90s on room air however was tachypneic.  Chest x-ray did reveal possible pneumonia she is started on azithromycin and ceftriaxone.  She was noted to have a leukocytosis of chronic thrombocytosis.  In the ED she was treated with continuous albuterol nebs, Atrovent, 2 g of IV magnesium and because she remained dyspneic she is placed in observation for further evaluation and management of her acute asthma exacerbation and suspected community-acquired pneumonia.  She is being treated for the following as below:  Asthma with acute exacerbation; possible CAP   -Patient has persistent asthma with frequent exacerbations, presenting with worsening SOB, cough, dyspneic at rest  -She had improved initially with treatment in ED a couple days ago, but began to worsen again with diminishing relief from nebs at home   -CXR concerning for possible mild PNA in lingula  -She was treated with continuous albuterol nebs, Atrovent, 2 g IV mag in ED but remains dyspneic at rest  - Check sputum culture, start Rocephin and azithromycin, start systemic steroids, continue ICS/LABA, continue scheduled Xopenex/Atrovent and as-needed albuterol nebs -Check Respiratory Virus Panel -Check Strep Pneumoniae Urinary Ag -Add Flutter Valve, Incentive Spirometry, and Guaifenesin 1200 mg po BID -Add Peak Flow Monitoring     Paroxysmal Atrial Fibrillation  - In sinus rhythm on admission  - CHADS-VASc at least 2 (CHF, gender)  - Continue Eliquis    Hyperglycemia -In the Setting of Steroids -C/w Sensitive Novolog SSI AC/HS -Check HbA1c -Continue to Monitor CBG's   Chronic Diastolic CHF  -Appears compensated  -Continue Lasix,  -Strict I's/O's, Daily Weights  Leukocytosis -In the setting of Steroid Demargination -Continue to Monitor for S/Sx of Infection -C/w Antibiotics as above -Repeat CBC in AM   Thrombocytosis -Likely Reactive  -Platelet Count went from 567 -> 460 -> 475 -Continue to Monitor and Repeat CBC in AM   Morbid Obesity -Estimated body mass index is 47.04 kg/m as calculated from the following:   Height as of this encounter: 5\' 6"  (1.676 m).   Weight as of this encounter: 132.2 kg. -Weight Loss and Dietary Counsleing Given  OSA -C/w Home CPAP Settings   We will continue to monitor patient's clinical response to intervention and repeat blood work and imaging in the a.m.

## 2018-05-17 ENCOUNTER — Observation Stay (HOSPITAL_COMMUNITY): Payer: BLUE CROSS/BLUE SHIELD

## 2018-05-17 ENCOUNTER — Other Ambulatory Visit: Payer: Self-pay

## 2018-05-17 ENCOUNTER — Observation Stay (HOSPITAL_BASED_OUTPATIENT_CLINIC_OR_DEPARTMENT_OTHER): Payer: BLUE CROSS/BLUE SHIELD

## 2018-05-17 ENCOUNTER — Other Ambulatory Visit (HOSPITAL_COMMUNITY): Payer: Self-pay

## 2018-05-17 DIAGNOSIS — D72829 Elevated white blood cell count, unspecified: Secondary | ICD-10-CM

## 2018-05-17 DIAGNOSIS — I5033 Acute on chronic diastolic (congestive) heart failure: Secondary | ICD-10-CM

## 2018-05-17 DIAGNOSIS — R0602 Shortness of breath: Secondary | ICD-10-CM

## 2018-05-17 LAB — CBC WITH DIFFERENTIAL/PLATELET
Abs Immature Granulocytes: 0.23 10*3/uL — ABNORMAL HIGH (ref 0.00–0.07)
Basophils Absolute: 0 10*3/uL (ref 0.0–0.1)
Basophils Relative: 0 %
Eosinophils Absolute: 0 10*3/uL (ref 0.0–0.5)
Eosinophils Relative: 0 %
HCT: 38.3 % (ref 36.0–46.0)
Hemoglobin: 11.9 g/dL — ABNORMAL LOW (ref 12.0–15.0)
Immature Granulocytes: 1 %
Lymphocytes Relative: 13 %
Lymphs Abs: 2.2 10*3/uL (ref 0.7–4.0)
MCH: 25.5 pg — ABNORMAL LOW (ref 26.0–34.0)
MCHC: 31.1 g/dL (ref 30.0–36.0)
MCV: 82.2 fL (ref 80.0–100.0)
MONOS PCT: 5 %
Monocytes Absolute: 0.9 10*3/uL (ref 0.1–1.0)
Neutro Abs: 13.6 10*3/uL — ABNORMAL HIGH (ref 1.7–7.7)
Neutrophils Relative %: 81 %
Platelets: 522 10*3/uL — ABNORMAL HIGH (ref 150–400)
RBC: 4.66 MIL/uL (ref 3.87–5.11)
RDW: 19.3 % — ABNORMAL HIGH (ref 11.5–15.5)
WBC: 17 10*3/uL — ABNORMAL HIGH (ref 4.0–10.5)
nRBC: 0 % (ref 0.0–0.2)

## 2018-05-17 LAB — COMPREHENSIVE METABOLIC PANEL
ALT: 8 U/L (ref 0–44)
AST: 12 U/L — ABNORMAL LOW (ref 15–41)
Albumin: 3.2 g/dL — ABNORMAL LOW (ref 3.5–5.0)
Alkaline Phosphatase: 69 U/L (ref 38–126)
Anion gap: 10 (ref 5–15)
BUN: 16 mg/dL (ref 6–20)
CO2: 22 mmol/L (ref 22–32)
Calcium: 8.9 mg/dL (ref 8.9–10.3)
Chloride: 104 mmol/L (ref 98–111)
Creatinine, Ser: 0.71 mg/dL (ref 0.44–1.00)
GFR calc Af Amer: >60 mL/min (ref >60)
GFR calc non Af Amer: >60 mL/min (ref >60)
Glucose, Bld: 141 mg/dL — ABNORMAL HIGH (ref 70–99)
Potassium: 4.1 mmol/L (ref 3.5–5.1)
Sodium: 136 mmol/L (ref 135–145)
Total Bilirubin: 0.5 mg/dL (ref 0.3–1.2)
Total Protein: 7.8 g/dL (ref 6.5–8.1)

## 2018-05-17 LAB — ECHOCARDIOGRAM COMPLETE
Height: 66 in
Weight: 4720 oz

## 2018-05-17 LAB — EXPECTORATED SPUTUM ASSESSMENT W GRAM STAIN, RFLX TO RESP C

## 2018-05-17 LAB — PHOSPHORUS: Phosphorus: 3.2 mg/dL (ref 2.5–4.6)

## 2018-05-17 LAB — HEMOGLOBIN A1C
Hgb A1c MFr Bld: 5.4 % (ref 4.8–5.6)
Mean Plasma Glucose: 108.28 mg/dL

## 2018-05-17 LAB — MAGNESIUM: MAGNESIUM: 2.2 mg/dL (ref 1.7–2.4)

## 2018-05-17 LAB — BRAIN NATRIURETIC PEPTIDE: B Natriuretic Peptide: 176.5 pg/mL — ABNORMAL HIGH (ref 0.0–100.0)

## 2018-05-17 LAB — MRSA PCR SCREENING: MRSA by PCR: NEGATIVE

## 2018-05-17 LAB — GLUCOSE, CAPILLARY: Glucose-Capillary: 118 mg/dL — ABNORMAL HIGH (ref 70–99)

## 2018-05-17 MED ORDER — IPRATROPIUM BROMIDE 0.02 % IN SOLN
0.5000 mg | Freq: Four times a day (QID) | RESPIRATORY_TRACT | Status: DC
  Administered 2018-05-18 – 2018-05-20 (×10): 0.5 mg via RESPIRATORY_TRACT
  Filled 2018-05-17 (×10): qty 2.5

## 2018-05-17 MED ORDER — PERFLUTREN LIPID MICROSPHERE
1.0000 mL | INTRAVENOUS | Status: AC | PRN
  Administered 2018-05-17: 2 mL via INTRAVENOUS
  Filled 2018-05-17: qty 10

## 2018-05-17 MED ORDER — AZITHROMYCIN 250 MG PO TABS
500.0000 mg | ORAL_TABLET | Freq: Every day | ORAL | Status: DC
  Administered 2018-05-18 – 2018-05-20 (×3): 500 mg via ORAL
  Filled 2018-05-17 (×3): qty 2

## 2018-05-17 MED ORDER — FUROSEMIDE 40 MG PO TABS
40.0000 mg | ORAL_TABLET | Freq: Two times a day (BID) | ORAL | Status: DC
  Administered 2018-05-17 – 2018-05-20 (×7): 40 mg via ORAL
  Filled 2018-05-17 (×7): qty 1

## 2018-05-17 MED ORDER — HYDRALAZINE HCL 20 MG/ML IJ SOLN
10.0000 mg | Freq: Four times a day (QID) | INTRAMUSCULAR | Status: DC | PRN
  Administered 2018-05-18: 10 mg via INTRAVENOUS
  Filled 2018-05-17 (×2): qty 1

## 2018-05-17 MED ORDER — LEVALBUTEROL HCL 1.25 MG/0.5ML IN NEBU
1.2500 mg | INHALATION_SOLUTION | Freq: Four times a day (QID) | RESPIRATORY_TRACT | Status: DC
  Administered 2018-05-18 – 2018-05-20 (×10): 1.25 mg via RESPIRATORY_TRACT
  Filled 2018-05-17 (×10): qty 0.5

## 2018-05-17 NOTE — Progress Notes (Signed)
Pre Peak Flow 210 Post Peak Flow 330 Good patient effort.

## 2018-05-17 NOTE — Progress Notes (Signed)
Patient follows at [facility]. Will follow for MATCH needs if DC'd over the weekend.

## 2018-05-17 NOTE — Progress Notes (Addendum)
Pre Peak Flow 250 Post Peak Flow 280 Good patient effort

## 2018-05-17 NOTE — Progress Notes (Signed)
Patient not ready for CPAP at this time. Would like to try to wear at next scheduled treatment time for a few hours is possible. CPAP is setup and in the room ready to be worn.

## 2018-05-17 NOTE — Progress Notes (Signed)
PHARMACIST - PHYSICIAN COMMUNICATION DR:  Sheikh CONCERNING: Antibiotic IV to Oral Route Change Policy  RECOMMENDATION: This patient is receiving azithromycin by the intravenous route.  Based on criteria approved by the Pharmacy and Therapeutics Committee, the antibiotic(s) is/are being converted to the equivalent oral dose form(s).   DESCRIPTION: These criteria include:  Patient being treated for a respiratory tract infection, urinary tract infection, cellulitis or clostridium difficile associated diarrhea if on metronidazole  The patient is not neutropenic and does not exhibit a GI malabsorption state  The patient is eating (either orally or via tube) and/or has been taking other orally administered medications for a least 24 hours  The patient is improving clinically and has a Tmax < 100.5  If you have questions about this conversion, please contact the Pharmacy Department  []  ( [phone] )  [place] []  ( [phone] )  [facility] [x]  ( [phone]  [place] []  ( [phone] )  [facility] []  ( [phone] )  [facility]   

## 2018-05-17 NOTE — Progress Notes (Signed)
PROGRESS NOTE    April [name]  UQJ:[id number] DOB: April 08, 1971 DOA: 05/15/2018 PCP: [name], MD   Brief Narrative:  Patient is a 48 year old morbidly obese African-American female with a past medical history significant for persistent asthma with a history of intubation, history of ARDS, proximal atrial fibrillation on Eliquis, chronic diastolic CHF, history of OSA on CPAP along with other comorbidities GERD, hypertension, seasonal allergies, tobacco abuse, anxiety and depression who presents to the emergency room with a chief complaint of along with associated cough.  She was seen in the ED on 05/13/2018 for the same exact complaints and improved with nebulizers and steroids in the ED and was able to go home at that time.  Initially she did well however her breathing became worse again and her breathing treatments no longer provided much relief as she became short of breath at rest and she eventually came back to the emergency room early this morning for evaluation.  She is found to be saturating in the mid 90s on room air however was tachypneic.  Chest x-ray did reveal possible pneumonia she is started on azithromycin and ceftriaxone.  She was noted to have a leukocytosis of chronic thrombocytosis.  In the ED she was treated with continuous albuterol nebs, Atrovent, 2 g of IV magnesium and because she remained dyspneic she is placed in observation for further evaluation and management of her acute asthma exacerbation and suspected community-acquired pneumonia. Further workup with imaging shows the patient likley has Acute on Chronic Diastolic CHF so will obtain BNP and ECHO.   Assessment & Plan:   Principal Problem:   Acute asthma exacerbation Active Problems:   OSA on CPAP   Chronic diastolic CHF (congestive heart failure) (HCC)   AF (paroxysmal atrial fibrillation) (HCC)  Asthma with acute exacerbation; possible CAP -Patient has persistent asthma with frequent exacerbations, presenting  with worsening SOB, cough, dyspneic at rest -She had improved initially with treatment in ED a couple days ago, but began to worsen again with diminishing relief from nebs at home -Initial CXR concerning for possible mild PNA in lingula -She was treated with continuous albuterol nebs, Atrovent, 2 g IV mag in ED but remains dyspneic at rest -Check sputum culture, start Rocephin and azithromycin, start systemic steroids, continue scheduled Xopenex/Atrovent q4h and as-needed albuterol nebs -C/w Montelukast 10 mg po qHS -Added Arfomoterol 15 mcg Neb BID and Budesonide 0.25 mg Neb BID -Check Respiratory Virus Panel and was negative -Check Strep Pneumoniae Urinary Ag -Add Flutter Valve, Incentive Spirometry, and Guaifenesin 1200 mg po BID -Add Peak Flow Monitoring -Repeat chest x-ray in a.m. -Diuresis as below -Patient continues to cough up greenish sputum -Will need Home Ambulatory Screen Prior to D/C    Paroxysmal Atrial Fibrillation -In sinus rhythm on admission -CHADS-VASc at least 2 (CHF, gender) -Continue Eliquis  Hyperglycemia -In the Setting of Steroids -C/w Sensitive Novolog SSI AC/HS -Checked HbA1c was 5.4 -Continue to Monitor CBG's carefully and CBGs have been ranging from 118-150   Acute on Chronic Diastolic CHF -Appeared compensatedinitially but is now appearing to be Volume Overloaded  -Saturation done and showed mild cardiomegaly as well as increased hazy linear perihilar opacity in both lungs favoring mild pulmonary edema due to CHF -Strict I's/O's, Daily Weights, and Fluid Restrict to 1500 mL -BNP was elevated 176.5 -Repeat echocardiogram and this is been done and pending read -Patient is +218 mL's since admission weight is up 4 pounds -Continue monitor and double diuresis from 40 mg daily to 40 mg  twice daily -Continue monitor volume status carefully  Leukocytosis -In the setting of Steroid Demargination.   -WBC went from 2.7 and is now  17.0 -Continue to Monitor for S/Sx of Infection -C/w Antibiotics as above -Repeat CBC in AM   Thrombocytosis -Likely Reactive  -Platelet Count went from 567 -> 460 -> 475 -> 522 -Continue to Monitor and Repeat CBC in AM   Morbid Obesity -Estimated body mass index is 47.61 kg/m as calculated from the following:   Height as of this encounter: 5\' 6"  (1.676 m).   Weight as of this encounter: 133.8 kg. -Weight Loss and Dietary Counsleing Given  OSA -C/w Home CPAP Settings   Normocytic Anemia -Patient's Hgb/Hct went from 11.8/38.7 -> 11.9/38.3 -Check Anemia Panel in the AM -Continue to Monitor for S/Sx of Bleeding -Repeat CBC in AM   Tobacco Abuse -Smoking Cessation Counseling Given -C/w Nicotine 21 mg TD Patch   DVT prophylaxis: Anticoagulated with Axpixaban Code Status: FULL CODE Family Communication: No family present at bedside Disposition Plan: Anticipate D/C Home in the next 24-48 hours if medically stable and improved Respiratory wise  Consultants:   None   Procedures:  ECHOCARDIOGRAM done and pending read   Antimicrobials:  Anti-infectives (From admission, onward)   Start     Dose/Rate Route Frequency Ordered Stop   05/18/18 1000  azithromycin (ZITHROMAX) tablet 500 mg     500 mg Oral Daily 05/17/18 1109 05/23/18 0959   05/16/18 0600  cefTRIAXone (ROCEPHIN) 1 g in sodium chloride 0.9 % 100 mL IVPB     1 g 200 mL/hr over 30 Minutes Intravenous Every 24 hours 05/16/18 0548 05/23/18 0559   05/16/18 0600  azithromycin (ZITHROMAX) 500 mg in sodium chloride 0.9 % 250 mL IVPB  Status:  Discontinued     500 mg 250 mL/hr over 60 Minutes Intravenous Every 24 hours 05/16/18 0548 05/17/18 1109     Subjective: And examined at bedside was not as dyspneic but still had some wheezing states that is better than yesterday.  No nausea or vomiting.  No lightheadedness or dizziness.  States that she is doing her peak flow meter.  Still very short of breath on exertion she  says.  No other concerns or complaints at this time.  Objective: Vitals:   05/17/18 1001 05/17/18 1157 05/17/18 1534 05/17/18 1539  BP: (!) 182/104   (!) 173/91  Pulse: (!) 104   94  Resp: 18   20  Temp: 98.4 F (36.9 C)     TempSrc: Oral     SpO2: 100% 99% 99% 100%  Weight:      Height:        Intake/Output Summary (Last 24 hours) at 05/17/2018 1559 Last data filed at 05/17/2018 7510 Gross per 24 hour  Intake 1050.64 ml  Output -  Net 1050.64 ml   Filed Weights   05/16/18 0600 05/17/18 0500  Weight: 132.2 kg 133.8 kg   Examination: Physical Exam:  Constitutional: WN/WD morbidly obese African-American female NAD and appears calm and comfortable Eyes: Lids and conjunctivae normal, sclerae anicteric  ENMT: External Ears, Nose appear normal. Grossly normal hearing. Mucous membranes are moist.  Neck: Appears normal, supple, no cervical masses, normal ROM, no appreciable thyromegaly; Difficult to assess JVD due to body habitus Respiratory: Diminished to auscultation bilaterally with some wheezing; No appreciable rales, rhonchi or crackles. Normal respiratory effort and patient is not tachypenic. No accessory muscle use. Not on any supplemental O2 Cardiovascular: RRR, no murmurs / rubs /  gallops. S1 and S2 auscultated. Trace-1+ LE extremity edema.  Abdomen: Soft, non-tender, Distended due to body habitus. No masses palpated. No appreciable hepatosplenomegaly. Bowel sounds positive x4.  GU: Deferred. Musculoskeletal: No clubbing / cyanosis of digits/nails. No joint deformity upper and lower extremities. Skin: No rashes, lesions, ulcers on a limited skin evaluation. No induration; Warm and dry.  Neurologic: CN 2-12 grossly intact with no focal deficits.  Romberg sign and cerebellar reflexes not assessed.  Psychiatric: Normal judgment and insight. Alert and oriented x 3. Normal mood and appropriate affect.   Data Reviewed: I have personally reviewed following labs and imaging  studies  CBC: Recent Labs  Lab 05/13/18 1412 05/16/18 0143 05/17/18 0447  WBC 15.4* 14.7* 17.0*  NEUTROABS 10.6* 13.0* 13.6*  HGB 11.5* 11.8* 11.9*  HCT 36.9 38.7 38.3  MCV 82.7 82.5 82.2  PLT 460* 475* 601*   Basic Metabolic Panel: Recent Labs  Lab 05/13/18 1412 05/16/18 0143 05/17/18 0447  NA 136 135 136  K 3.3* 3.9 4.1  CL 102 104 104  CO2 25 20* 22  GLUCOSE 101* 167* 141*  BUN <5* 10 16  CREATININE 0.63 0.66 0.71  CALCIUM 8.6* 8.8* 8.9  MG  --   --  2.2  PHOS  --   --  3.2   GFR: Estimated Creatinine Clearance: 124.9 mL/min (by C-G formula based on SCr of 0.71 mg/dL). Liver Function Tests: Recent Labs  Lab 05/17/18 0447  AST 12*  ALT 8  ALKPHOS 69  BILITOT 0.5  PROT 7.8  ALBUMIN 3.2*   No results for input(s): LIPASE, AMYLASE in the last 168 hours. No results for input(s): AMMONIA in the last 168 hours. Coagulation Profile: No results for input(s): INR, PROTIME in the last 168 hours. Cardiac Enzymes: No results for input(s): CKTOTAL, CKMB, CKMBINDEX, TROPONINI in the last 168 hours. BNP (last 3 results) No results for input(s): PROBNP in the last 8760 hours. HbA1C: Recent Labs    05/17/18 0447  HGBA1C 5.4   CBG: Recent Labs  Lab 05/16/18 0611 05/16/18 2137 05/17/18 0829  GLUCAP 129* 150* 118*   Lipid Profile: No results for input(s): CHOL, HDL, LDLCALC, TRIG, CHOLHDL, LDLDIRECT in the last 72 hours. Thyroid Function Tests: No results for input(s): TSH, T4TOTAL, FREET4, T3FREE, THYROIDAB in the last 72 hours. Anemia Panel: No results for input(s): VITAMINB12, FOLATE, FERRITIN, TIBC, IRON, RETICCTPCT in the last 72 hours. Sepsis Labs: No results for input(s): PROCALCITON, LATICACIDVEN in the last 168 hours.  Recent Results (from the past 240 hour(s))  Respiratory Panel by PCR     Status: None   Collection Time: 05/16/18  8:59 AM  Result Value Ref Range Status   Adenovirus NOT DETECTED NOT DETECTED Final   Coronavirus 229E NOT DETECTED  NOT DETECTED Final   Coronavirus HKU1 NOT DETECTED NOT DETECTED Final   Coronavirus NL63 NOT DETECTED NOT DETECTED Final   Coronavirus OC43 NOT DETECTED NOT DETECTED Final   Metapneumovirus NOT DETECTED NOT DETECTED Final   Rhinovirus / Enterovirus NOT DETECTED NOT DETECTED Final   Influenza A NOT DETECTED NOT DETECTED Final   Influenza B NOT DETECTED NOT DETECTED Final   Parainfluenza Virus 1 NOT DETECTED NOT DETECTED Final   Parainfluenza Virus 2 NOT DETECTED NOT DETECTED Final   Parainfluenza Virus 3 NOT DETECTED NOT DETECTED Final   Parainfluenza Virus 4 NOT DETECTED NOT DETECTED Final   Respiratory Syncytial Virus NOT DETECTED NOT DETECTED Final   Bordetella pertussis NOT DETECTED NOT DETECTED Final  Chlamydophila pneumoniae NOT DETECTED NOT DETECTED Final   Mycoplasma pneumoniae NOT DETECTED NOT DETECTED Final    Comment: Performed at [facility], [place] [address], [place], [place] [address]  Culture, sputum-assessment     Status: None   Collection Time: 05/16/18  8:52 PM  Result Value Ref Range Status   Specimen Description EXPECTORATED SPUTUM  Final   Special Requests NONE  Final   Sputum evaluation   Final    THIS SPECIMEN IS ACCEPTABLE FOR SPUTUM CULTURE Performed at [facility], [address] [place], [place] [address]    Report Status 05/17/2018 FINAL  Final  Culture, respiratory     Status: None (Preliminary result)   Collection Time: 05/16/18  8:52 PM  Result Value Ref Range Status   Specimen Description EXPECTORATED SPUTUM  Final   Special Requests NONE Reflexed from [id number]  Final   Gram Stain   Final    MODERATE WBC PRESENT, PREDOMINANTLY PMN FEW GRAM POSITIVE COCCI IN PAIRS FEW GRAM NEGATIVE COCCOBACILLI FEW GRAM POSITIVE RODS Performed at [facility], [place] [address], [place], [place] [address]    Culture PENDING  Incomplete   Report Status PENDING  Incomplete    Radiology Studies: Dg Chest Port 1 View  Result Date:  05/17/2018 CLINICAL DATA:  Dyspnea EXAM: PORTABLE CHEST 1 VIEW COMPARISON:  05/16/2018 chest radiograph. FINDINGS: Stable cardiomediastinal silhouette with mild cardiomegaly. No pneumothorax. No pleural effusion. Increased hazy linear parahilar opacity in both lungs. IMPRESSION: Mild cardiomegaly. Increased hazy linear parahilar opacity in both lungs, favor mild pulmonary edema due to congestive heart failure. Electronically Signed   By: Ilona Sorrel M.D.   On: 05/17/2018 07:02   Dg Chest Port 1 View  Result Date: 05/16/2018 CLINICAL DATA:  Dyspnea, smoker EXAM: PORTABLE CHEST 1 VIEW COMPARISON:  05/13/2018 chest radiograph. FINDINGS: Stable cardiomediastinal silhouette with normal heart size. No pneumothorax. No pleural effusion. Hazy curvilinear opacities in the lingula silhouettes the left heart border, unchanged. No overt pulmonary edema. IMPRESSION: Stable hazy curvilinear opacities in the lingula silhouetting the left heart border, suggesting atelectasis or mild pneumonia. Recommend follow-up PA and lateral post treatment chest radiographs in 4-6 weeks. Electronically Signed   By: Ilona Sorrel M.D.   On: 05/16/2018 04:20   Scheduled Meds: . apixaban  5 mg Oral BID  . arformoterol  15 mcg Nebulization BID  . [START ON 05/18/2018] azithromycin  500 mg Oral Daily  . budesonide (PULMICORT) nebulizer solution  0.25 mg Nebulization BID  . furosemide  40 mg Oral BID  . gabapentin  300 mg Oral BID  . guaiFENesin  1,200 mg Oral BID  . insulin aspart  0-5 Units Subcutaneous QHS  . insulin aspart  0-9 Units Subcutaneous TID WC  . ipratropium  0.5 mg Nebulization Q4H  . levalbuterol  1.25 mg Nebulization Q4H  . Melatonin  1 tablet Oral QHS  . methylPREDNISolone (SOLU-MEDROL) injection  60 mg Intravenous Q6H  . montelukast  10 mg Oral QHS  . nicotine  21 mg Transdermal Daily  . pantoprazole  40 mg Oral Daily  . sodium chloride flush  3 mL Intravenous Q12H  . sodium chloride flush  3 mL Intravenous  Q12H   Continuous Infusions: . sodium chloride    . cefTRIAXone (ROCEPHIN)  IV Stopped (05/17/18 0900)    LOS: 0 days   Kerney Elbe, DO [facility] PAGER is on [place]  If 7PM-7AM, please contact night-coverage [email or web address] Password [facility] 05/17/2018,  3:59 PM

## 2018-05-17 NOTE — Progress Notes (Signed)
  Echocardiogram 2D Echocardiogram has been performed.  April [name] L Androw 05/17/2018, 3:22 PM

## 2018-05-17 NOTE — Discharge Instructions (Signed)

## 2018-05-17 NOTE — Progress Notes (Signed)
Rt spoke with pt and she has refused cpap for the night.  Pt does not want to wear while in the hospital.  RT removed order at this time. RT will monitor.

## 2018-05-17 NOTE — Progress Notes (Signed)
Rt spoke with pt who states she doe not wear cpap at home (>6 months) after an admission at [facility].  Pt did not wear cpap last night and states she did fine.  RT explained she could come to place device at anytime, but if she hasn't decided by midnight, RT will return and either place pt on it or remove it from pt's room. RT will monitor.

## 2018-05-18 ENCOUNTER — Observation Stay (HOSPITAL_COMMUNITY): Payer: BLUE CROSS/BLUE SHIELD

## 2018-05-18 ENCOUNTER — Telehealth: Payer: Self-pay | Admitting: Family Medicine

## 2018-05-18 LAB — COMPREHENSIVE METABOLIC PANEL
ALK PHOS: 67 U/L (ref 38–126)
ALT: 10 U/L (ref 0–44)
AST: 13 U/L — ABNORMAL LOW (ref 15–41)
Albumin: 3.2 g/dL — ABNORMAL LOW (ref 3.5–5.0)
Anion gap: 11 (ref 5–15)
BUN: 16 mg/dL (ref 6–20)
CO2: 22 mmol/L (ref 22–32)
Calcium: 8.4 mg/dL — ABNORMAL LOW (ref 8.9–10.3)
Chloride: 104 mmol/L (ref 98–111)
Creatinine, Ser: 0.88 mg/dL (ref 0.44–1.00)
GFR calc Af Amer: >60 mL/min (ref >60)
GFR calc non Af Amer: >60 mL/min (ref >60)
Glucose, Bld: 145 mg/dL — ABNORMAL HIGH (ref 70–99)
Potassium: 3.4 mmol/L — ABNORMAL LOW (ref 3.5–5.1)
SODIUM: 137 mmol/L (ref 135–145)
Total Bilirubin: 0.2 mg/dL — ABNORMAL LOW (ref 0.3–1.2)
Total Protein: 7.6 g/dL (ref 6.5–8.1)

## 2018-05-18 LAB — CBC WITH DIFFERENTIAL/PLATELET
Abs Immature Granulocytes: 0.73 10*3/uL — ABNORMAL HIGH (ref 0.00–0.07)
Basophils Absolute: 0 10*3/uL (ref 0.0–0.1)
Basophils Relative: 0 %
Eosinophils Absolute: 0 10*3/uL (ref 0.0–0.5)
Eosinophils Relative: 0 %
HCT: 37.8 % (ref 36.0–46.0)
Hemoglobin: 11.9 g/dL — ABNORMAL LOW (ref 12.0–15.0)
Immature Granulocytes: 4 %
Lymphocytes Relative: 11 %
Lymphs Abs: 1.8 10*3/uL (ref 0.7–4.0)
MCH: 25.8 pg — AB (ref 26.0–34.0)
MCHC: 31.5 g/dL (ref 30.0–36.0)
MCV: 82 fL (ref 80.0–100.0)
Monocytes Absolute: 0.9 10*3/uL (ref 0.1–1.0)
Monocytes Relative: 5 %
Neutro Abs: 13.5 10*3/uL — ABNORMAL HIGH (ref 1.7–7.7)
Neutrophils Relative %: 80 %
Platelets: 543 10*3/uL — ABNORMAL HIGH (ref 150–400)
RBC: 4.61 MIL/uL (ref 3.87–5.11)
RDW: 19.1 % — ABNORMAL HIGH (ref 11.5–15.5)
WBC: 17 10*3/uL — ABNORMAL HIGH (ref 4.0–10.5)
nRBC: 0 % (ref 0.0–0.2)

## 2018-05-18 LAB — GLUCOSE, CAPILLARY
Glucose-Capillary: 146 mg/dL — ABNORMAL HIGH (ref 70–99)
Glucose-Capillary: 166 mg/dL — ABNORMAL HIGH (ref 70–99)
Glucose-Capillary: 210 mg/dL — ABNORMAL HIGH (ref 70–99)
Glucose-Capillary: 181 mg/dL — ABNORMAL HIGH (ref 70–99)
Glucose-Capillary: 159 mg/dL — ABNORMAL HIGH (ref 70–99)
Glucose-Capillary: 163 mg/dL — ABNORMAL HIGH (ref 70–99)
Glucose-Capillary: 132 mg/dL — ABNORMAL HIGH (ref 70–99)
Glucose-Capillary: 163 mg/dL — ABNORMAL HIGH (ref 70–99)
Glucose-Capillary: 204 mg/dL — ABNORMAL HIGH (ref 70–99)

## 2018-05-18 LAB — MAGNESIUM: Magnesium: 2.1 mg/dL (ref 1.7–2.4)

## 2018-05-18 LAB — PHOSPHORUS: Phosphorus: 2.7 mg/dL (ref 2.5–4.6)

## 2018-05-18 MED ORDER — METOPROLOL TARTRATE 25 MG PO TABS
25.0000 mg | ORAL_TABLET | Freq: Two times a day (BID) | ORAL | Status: DC
  Administered 2018-05-18 – 2018-05-19 (×3): 25 mg via ORAL
  Filled 2018-05-18 (×3): qty 1

## 2018-05-18 MED ORDER — FUROSEMIDE 10 MG/ML IJ SOLN: 40.0000 mg | Freq: Once | INTRAMUSCULAR | Status: DC

## 2018-05-18 MED ORDER — LIP MEDEX EX OINT
1.0000 "application " | TOPICAL_OINTMENT | CUTANEOUS | Status: DC | PRN
  Filled 2018-05-18: qty 7

## 2018-05-18 MED ORDER — POTASSIUM CHLORIDE CRYS ER 20 MEQ PO TBCR
40.0000 meq | EXTENDED_RELEASE_TABLET | Freq: Once | ORAL | Status: AC
  Administered 2018-05-18: 40 meq via ORAL
  Filled 2018-05-18: qty 2

## 2018-05-18 NOTE — Care Management Note (Addendum)
Case Management Note  Patient Details  Name: April [name] MRN: [id number] Date of Birth: Dec 02, 1970  Subjective/Objective:   From home, patient states she has insurance with [employer], she gave NCM the card and NCM made copy of the card and put copy in chart and faxed to ED Admissions to put in system.  [facility] , apt made for 2/4 at 9:50 for hospital follow up and she goes there to get her medications.. Patient also states she will be applying for Medicaid soon.                  Action/Plan: DC home when ready.   Expected Discharge Date:                  Expected Discharge Plan:  Home/Self Care  In-House Referral:     Discharge planning Services  CM Consult, Follow-up appt scheduled  Post Acute Care Choice:    Choice offered to:     DME Arranged:    DME Agency:     HH Arranged:    HH Agency:     Status of Service:  Completed, signed off  If discussed at [employer] of Stay Meetings, dates discussed:    Additional Comments:  April Mayo, RN 05/18/2018, 3:58 PM

## 2018-05-18 NOTE — Telephone Encounter (Signed)
Patient called to speak with you did not disclose reason for call. Patient did mention she is at the hospital.  Please follow up

## 2018-05-18 NOTE — Progress Notes (Signed)
[facility]                                                                              Patient Demographics  Analeese Andreatta, is a 48 y.o. female, DOB - 26-Jun-1970, OEU:[id number]  Admit date - 05/15/2018   Admitting Physician [name], MD  Outpatient Primary MD for the patient is [name], MD  Outpatient specialists:   LOS - 0  days   Medical records reviewed and are as summarized below:    Chief Complaint  Patient presents with  . Asthma       Brief summary   Patient is a 48 year old African-American female with history of persistent asthma, history of intubation and ARDS, paroxysmal A. fib on Eliquis, chronic diastolic CHF, history of OSA on CPAP, GERD, hypertension, tobacco abuse presented to ED with shortness of breath and coughing.  She was seen in ED on 1/15 and had improved with nebulizer and steroids.  Subsequently she became more short of breath at rest and eventually came back to the ED and was found to be hypoxic and tachypneic.  Chest x-ray revealed possible pneumonia.  Patient was admitted for pneumonia and acute asthma exacerbation.  Assessment & Plan    Principal Problem:   Acute on chronic persistent asthma with exacerbation -Still wheezing diffusely, continue scheduled nebs, IV Rocephin, Zithromax -Continue IV steroids 60 mg every 6 hours, Pulmicort, Brovana, flutter valve -Follow sputum culture, urine strep antigen negative, respiratory virus panel negative -Repeat chest x-ray on 05/18/2018 shows no pneumonia  Active Problems:   OSA on CPAP -Noncompliant, refuses CPAP at night  Mild acute on chronic diastolic CHF (congestive heart failure) ([facility]) -Chest x-ray with pulmonary edema, BNP 176.5, -2D echo showed normal EF 60 to 65% with grade 2 diastolic dysfunction -Follow strict I's and O's and daily weights, continue Lasix 40 mg twice daily  Leukocytosis -Likely secondary to steroids, follow closely    AF (paroxysmal atrial  fibrillation) (HCC) -Currently heart rate elevated 94 to low 100s with elevated BP -Restart metoprolol 25mg  twice daily  -continue Eliquis   Essential hypertension Continue Lasix, restarted metoprolol  Normocytic anemia -H&H currently at baseline, follow closely  Chronic thrombocytosis -Likely reactive, follow platelet count  Tobacco abuse -Patient strongly counseled on tobacco cessation, continue nicotine patch  Morbid obesity BMI 48, counseled on diet and weight control  Code Status: full code  DVT Prophylaxis: Eliquis Family Communication: Discussed in detail with the patient, all imaging results, lab results explained to the patient    Disposition Plan: Diffusely wheezing, needs inpatient management  Time Spent in minutes   35 minutes  Procedures:  2D echo  Consultants:   None  Antimicrobials:   Anti-infectives (From admission, onward)   Start     Dose/Rate Route Frequency Ordered Stop   05/18/18 1000  azithromycin (ZITHROMAX) tablet 500 mg     500 mg Oral Daily 05/17/18 1109 05/23/18 0959   05/16/18 0600  cefTRIAXone (ROCEPHIN) 1 g in sodium chloride 0.9 % 100 mL IVPB     1 g 200 mL/hr over 30 Minutes Intravenous Every 24 hours 05/16/18 0548 05/23/18 0559  05/16/18 0600  azithromycin (ZITHROMAX) 500 mg in sodium chloride 0.9 % 250 mL IVPB  Status:  Discontinued     500 mg 250 mL/hr over 60 Minutes Intravenous Every 24 hours 05/16/18 0548 05/17/18 1109          Medications  Scheduled Meds: . apixaban  5 mg Oral BID  . arformoterol  15 mcg Nebulization BID  . azithromycin  500 mg Oral Daily  . budesonide (PULMICORT) nebulizer solution  0.25 mg Nebulization BID  . furosemide  40 mg Oral BID  . gabapentin  300 mg Oral BID  . guaiFENesin  1,200 mg Oral BID  . insulin aspart  0-5 Units Subcutaneous QHS  . insulin aspart  0-9 Units Subcutaneous TID WC  . ipratropium  0.5 mg Nebulization QID  . levalbuterol  1.25 mg Nebulization QID  . Melatonin  1  tablet Oral QHS  . methylPREDNISolone (SOLU-MEDROL) injection  60 mg Intravenous Q6H  . montelukast  10 mg Oral QHS  . nicotine  21 mg Transdermal Daily  . pantoprazole  40 mg Oral Daily  . sodium chloride flush  3 mL Intravenous Q12H  . sodium chloride flush  3 mL Intravenous Q12H   Continuous Infusions: . sodium chloride    . cefTRIAXone (ROCEPHIN)  IV 1 g (05/18/18 0529)   PRN Meds:.sodium chloride, acetaminophen **OR** acetaminophen, albuterol, hydrALAZINE, HYDROcodone-acetaminophen, ondansetron **OR** ondansetron (ZOFRAN) IV, polyethylene glycol, sodium chloride flush, zolpidem      Subjective:   Korrine Sicard was seen and examined today.  Diffusely wheezing, short of breath, no chest pain.  No fevers. Patient denies dizziness, abdominal pain, N/V/D/C, new weakness, numbess, tingling. No acute events overnight.    Objective:   Vitals:   05/18/18 0046 05/18/18 0500 05/18/18 0743 05/18/18 0848  BP: (!) 176/104   (!) 174/111  Pulse:    (!) 101  Resp:    18  Temp:    98.1 F (36.7 C)  TempSrc:    Oral  SpO2:   97% 100%  Weight:  135 kg    Height:        Intake/Output Summary (Last 24 hours) at 05/18/2018 1106 Last data filed at 05/17/2018 2200 Gross per 24 hour  Intake 860 ml  Output -  Net 860 ml     Wt Readings from Last 3 Encounters:  05/18/18 135 kg  03/18/18 134.7 kg  01/22/18 130.2 kg     Exam  General: Alert and oriented x 3, NAD  Eyes: PERRLA, EOMI, Anicteric Sclera,  HEENT:  Atraumatic, normocephalic, normal oropharynx  Cardiovascular: S1 S2 auscultated, sinus tachycardia, Regular rate and rhythm.  Respiratory: Bilateral expiratory wheezing  Gastrointestinal: Soft, nontender, nondistended, + bowel sounds  Ext: 1+ pedal edema bilaterally  Neuro no new deficits  Musculoskeletal: No digital cyanosis, clubbing  Skin: No rashes  Psych: Normal affect and demeanor, alert and oriented x3    Data Reviewed:  I have personally reviewed following  labs and imaging studies  Micro Results Recent Results (from the past 240 hour(s))  Respiratory Panel by PCR     Status: None   Collection Time: 05/16/18  8:59 AM  Result Value Ref Range Status   Adenovirus NOT DETECTED NOT DETECTED Final   Coronavirus 229E NOT DETECTED NOT DETECTED Final   Coronavirus HKU1 NOT DETECTED NOT DETECTED Final   Coronavirus NL63 NOT DETECTED NOT DETECTED Final   Coronavirus OC43 NOT DETECTED NOT DETECTED Final   Metapneumovirus NOT DETECTED NOT DETECTED Final  Rhinovirus / Enterovirus NOT DETECTED NOT DETECTED Final   Influenza A NOT DETECTED NOT DETECTED Final   Influenza B NOT DETECTED NOT DETECTED Final   Parainfluenza Virus 1 NOT DETECTED NOT DETECTED Final   Parainfluenza Virus 2 NOT DETECTED NOT DETECTED Final   Parainfluenza Virus 3 NOT DETECTED NOT DETECTED Final   Parainfluenza Virus 4 NOT DETECTED NOT DETECTED Final   Respiratory Syncytial Virus NOT DETECTED NOT DETECTED Final   Bordetella pertussis NOT DETECTED NOT DETECTED Final   Chlamydophila pneumoniae NOT DETECTED NOT DETECTED Final   Mycoplasma pneumoniae NOT DETECTED NOT DETECTED Final    Comment: Performed at [facility], [place] [address], [place], [place] [address]  Culture, sputum-assessment     Status: None   Collection Time: 05/16/18  8:52 PM  Result Value Ref Range Status   Specimen Description EXPECTORATED SPUTUM  Final   Special Requests NONE  Final   Sputum evaluation   Final    THIS SPECIMEN IS ACCEPTABLE FOR SPUTUM CULTURE Performed at [facility], [address], [place], [place] [address]    Report Status 05/17/2018 FINAL  Final  Culture, respiratory     Status: None (Preliminary result)   Collection Time: 05/16/18  8:52 PM  Result Value Ref Range Status   Specimen Description EXPECTORATED SPUTUM  Final   Special Requests NONE Reflexed from [id number]  Final   Gram Stain   Final    MODERATE WBC PRESENT, PREDOMINANTLY PMN FEW GRAM POSITIVE COCCI IN  PAIRS FEW GRAM NEGATIVE COCCOBACILLI FEW GRAM POSITIVE RODS Performed at [facility], [place] [address], [place], [place] [address]    Culture FEW Consistent with normal respiratory flora.  Final   Report Status PENDING  Incomplete  MRSA PCR Screening     Status: None   Collection Time: 05/17/18  5:32 PM  Result Value Ref Range Status   MRSA by PCR NEGATIVE NEGATIVE Final    Comment:        The GeneXpert MRSA Assay (FDA approved for NASAL specimens only), is one component of a comprehensive MRSA colonization surveillance program. It is not intended to diagnose MRSA infection nor to guide or monitor treatment for MRSA infections. Performed at [facility], [place] [address], [place], [place] [address]     Radiology Reports Dg Chest 2 View  Result Date: 05/13/2018 CLINICAL DATA:  Cough, shortness of breath, dry cough, history asthma, COPD, CHF, hypertension, former smoker EXAM: CHEST - 2 VIEW COMPARISON:  04/26/2018 FINDINGS: Normal heart size, mediastinal contours, and pulmonary vascularity. Peribronchial thickening and slight accentuation of perihilar markings which may reflect bronchitis or asthma. No definite acute infiltrate, pleural effusion or pneumothorax. Bones unremarkable. IMPRESSION: Changes of bronchitis versus asthma without acute infiltrate. Electronically Signed   By: Lavonia Dana M.D.   On: 05/13/2018 13:07   Dg Chest 2 View  Result Date: 04/26/2018 CLINICAL DATA:  Chest pain for 1 day EXAM: CHEST - 2 VIEW COMPARISON:  March 12, 2018 FINDINGS: The heart size and mediastinal contours are within normal limits. There is no focal infiltrate, pulmonary edema, or pleural effusion. The visualized skeletal structures are unremarkable. IMPRESSION: No active cardiopulmonary disease. Electronically Signed   By: Abelardo Diesel M.D.   On: 04/26/2018 01:08   Dg Chest Port 1 View  Result Date: 05/18/2018 CLINICAL DATA:  Shortness of breath for a week EXAM: PORTABLE CHEST  1 VIEW COMPARISON:  Yesterday FINDINGS: Chronic interstitial coarsening with fibrosis seen in  the apical lungs on June 2019 chest CT. Given the apical predilection by CT sarcoid is considered, but there is no adenopathy. Chronic cardiomegaly. There is no edema, consolidation, effusion, or pneumothorax. IMPRESSION: 1. No acute finding. 2. Cardiomegaly and apical predominant pulmonary fibrosis. Is there history of sarcoid? Electronically Signed   By: Monte Fantasia M.D.   On: 05/18/2018 06:32   Dg Chest Port 1 View  Result Date: 05/17/2018 CLINICAL DATA:  Dyspnea EXAM: PORTABLE CHEST 1 VIEW COMPARISON:  05/16/2018 chest radiograph. FINDINGS: Stable cardiomediastinal silhouette with mild cardiomegaly. No pneumothorax. No pleural effusion. Increased hazy linear parahilar opacity in both lungs. IMPRESSION: Mild cardiomegaly. Increased hazy linear parahilar opacity in both lungs, favor mild pulmonary edema due to congestive heart failure. Electronically Signed   By: Ilona Sorrel M.D.   On: 05/17/2018 07:02   Dg Chest Port 1 View  Result Date: 05/16/2018 CLINICAL DATA:  Dyspnea, smoker EXAM: PORTABLE CHEST 1 VIEW COMPARISON:  05/13/2018 chest radiograph. FINDINGS: Stable cardiomediastinal silhouette with normal heart size. No pneumothorax. No pleural effusion. Hazy curvilinear opacities in the lingula silhouettes the left heart border, unchanged. No overt pulmonary edema. IMPRESSION: Stable hazy curvilinear opacities in the lingula silhouetting the left heart border, suggesting atelectasis or mild pneumonia. Recommend follow-up PA and lateral post treatment chest radiographs in 4-6 weeks. Electronically Signed   By: Ilona Sorrel M.D.   On: 05/16/2018 04:20   Vas [place] Lower Extremity Venous (dvt) (only [facility])  Result Date: 05/13/2018  Lower Venous Study Indications: Edema.  Limitations: Body habitus. Performing Technologist: Abram Sander RVS  Examination Guidelines: A complete evaluation includes B-mode  imaging, spectral Doppler, color Doppler, and power Doppler as needed of all accessible portions of each vessel. Bilateral testing is considered an integral part of a complete examination. Limited examinations for reoccurring indications may be performed as noted.  Right Venous Findings: +---------+---------------+---------+-----------+----------+--------------+          CompressibilityPhasicitySpontaneityPropertiesSummary        +---------+---------------+---------+-----------+----------+--------------+ CFV      Full           Yes      Yes                                 +---------+---------------+---------+-----------+----------+--------------+ SFJ      Full                                                        +---------+---------------+---------+-----------+----------+--------------+ FV Prox  Full                                                        +---------+---------------+---------+-----------+----------+--------------+ FV Mid   Full                                                        +---------+---------------+---------+-----------+----------+--------------+ FV DistalFull                                                        +---------+---------------+---------+-----------+----------+--------------+  PFV      Full                                                        +---------+---------------+---------+-----------+----------+--------------+ POP      Full           Yes      Yes                                 +---------+---------------+---------+-----------+----------+--------------+ PTV      Full                                                        +---------+---------------+---------+-----------+----------+--------------+ PERO                                                  Not visualized +---------+---------------+---------+-----------+----------+--------------+  Left Venous Findings:  +---+---------------+---------+-----------+----------+-------+    CompressibilityPhasicitySpontaneityPropertiesSummary +---+---------------+---------+-----------+----------+-------+ CFVFull           Yes      Yes                          +---+---------------+---------+-----------+----------+-------+    Summary: Right: There is no evidence of deep vein thrombosis in the lower extremity. A cystic structure is found in the popliteal fossa. Left: No evidence of common femoral vein obstruction.  *See table(s) above for measurements and observations. Electronically signed by Curt Jews MD on 05/13/2018 at 8:04:10 PM.    Final     Lab Data:  CBC: Recent Labs  Lab 05/13/18 1412 05/16/18 0143 05/17/18 0447 05/18/18 0307  WBC 15.4* 14.7* 17.0* 17.0*  NEUTROABS 10.6* 13.0* 13.6* 13.5*  HGB 11.5* 11.8* 11.9* 11.9*  HCT 36.9 38.7 38.3 37.8  MCV 82.7 82.5 82.2 82.0  PLT 460* 475* 522* 852*   Basic Metabolic Panel: Recent Labs  Lab 05/13/18 1412 05/16/18 0143 05/17/18 0447 05/18/18 0307  NA 136 135 136 137  K 3.3* 3.9 4.1 3.4*  CL 102 104 104 104  CO2 25 20* 22 22  GLUCOSE 101* 167* 141* 145*  BUN <5* 10 16 16   CREATININE 0.63 0.66 0.71 0.88  CALCIUM 8.6* 8.8* 8.9 8.4*  MG  --   --  2.2 2.1  PHOS  --   --  3.2 2.7   GFR: Estimated Creatinine Clearance: 114.2 mL/min (by C-G formula based on SCr of 0.88 mg/dL). Liver Function Tests: Recent Labs  Lab 05/17/18 0447 05/18/18 0307  AST 12* 13*  ALT 8 10  ALKPHOS 69 67  BILITOT 0.5 0.2*  PROT 7.8 7.6  ALBUMIN 3.2* 3.2*   No results for input(s): LIPASE, AMYLASE in the last 168 hours. No results for input(s): AMMONIA in the last 168 hours. Coagulation Profile: No results for input(s): INR, PROTIME in the last 168 hours. Cardiac Enzymes: No results for input(s): CKTOTAL, CKMB, CKMBINDEX, TROPONINI in the last 168 hours. BNP (last 3 results) No results for input(s):  PROBNP in the last 8760 hours. HbA1C: Recent Labs     05/17/18 0447  HGBA1C 5.4   CBG: Recent Labs  Lab 05/16/18 2137 05/17/18 0829 05/17/18 1142 05/17/18 1739 05/17/18 2132  GLUCAP 150* 118* 181* 159* 163*   Lipid Profile: No results for input(s): CHOL, HDL, LDLCALC, TRIG, CHOLHDL, LDLDIRECT in the last 72 hours. Thyroid Function Tests: No results for input(s): TSH, T4TOTAL, FREET4, T3FREE, THYROIDAB in the last 72 hours. Anemia Panel: No results for input(s): VITAMINB12, FOLATE, FERRITIN, TIBC, IRON, RETICCTPCT in the last 72 hours. Urine analysis:    Component Value Date/Time   COLORURINE YELLOW 10/13/2017 1100   APPEARANCEUR CLEAR 10/13/2017 1100   LABSPEC 1.005 10/13/2017 1100   PHURINE 5.0 10/13/2017 1100   GLUCOSEU NEGATIVE 10/13/2017 1100   GLUCOSEU NEG mg/dL 10/28/2007 2049   HGBUR SMALL (A) 10/13/2017 1100   BILIRUBINUR NEGATIVE 10/13/2017 1100   KETONESUR NEGATIVE 10/13/2017 1100   PROTEINUR NEGATIVE 10/13/2017 1100   UROBILINOGEN 1.0 11/21/2014 0707   NITRITE NEGATIVE 10/13/2017 1100   LEUKOCYTESUR SMALL (A) 10/13/2017 1100     Binnie Droessler M.D. [facility] 05/18/2018, 11:06 AM  Pager: [phone] Between 7am to 7pm - call Pager - [phone]  After 7pm go to [email or web address] - password [email or web address]  Call night coverage person covering after 7pm

## 2018-05-19 ENCOUNTER — Telehealth: Payer: Self-pay

## 2018-05-19 LAB — CULTURE, RESPIRATORY: CULTURE: NORMAL

## 2018-05-19 LAB — GLUCOSE, CAPILLARY
Glucose-Capillary: 131 mg/dL — ABNORMAL HIGH (ref 70–99)
Glucose-Capillary: 157 mg/dL — ABNORMAL HIGH (ref 70–99)
Glucose-Capillary: 169 mg/dL — ABNORMAL HIGH (ref 70–99)
Glucose-Capillary: 183 mg/dL — ABNORMAL HIGH (ref 70–99)

## 2018-05-19 LAB — CULTURE, RESPIRATORY W GRAM STAIN

## 2018-05-19 MED ORDER — METOPROLOL TARTRATE 50 MG PO TABS
50.0000 mg | ORAL_TABLET | Freq: Two times a day (BID) | ORAL | Status: DC
  Administered 2018-05-19 – 2018-05-20 (×2): 50 mg via ORAL
  Filled 2018-05-19 (×2): qty 1

## 2018-05-19 MED ORDER — POTASSIUM CHLORIDE CRYS ER 20 MEQ PO TBCR
40.0000 meq | EXTENDED_RELEASE_TABLET | Freq: Every day | ORAL | Status: DC
  Administered 2018-05-19 – 2018-05-20 (×2): 40 meq via ORAL
  Filled 2018-05-19 (×2): qty 2

## 2018-05-19 MED ORDER — FUROSEMIDE 10 MG/ML IJ SOLN
20.0000 mg | Freq: Once | INTRAMUSCULAR | Status: AC
  Administered 2018-05-19: 20 mg via INTRAVENOUS
  Filled 2018-05-19: qty 2

## 2018-05-19 MED ORDER — METHYLPREDNISOLONE SODIUM SUCC 125 MG IJ SOLR
60.0000 mg | Freq: Three times a day (TID) | INTRAMUSCULAR | Status: DC
  Administered 2018-05-19 – 2018-05-20 (×3): 60 mg via INTRAVENOUS
  Filled 2018-05-19 (×3): qty 2

## 2018-05-19 NOTE — Telephone Encounter (Signed)
Call placed to the patient. She wanted to inform this CM that she is in the hospital.  She wanted to make sure that she has a follow up appointment scheduled with Dr Margarita Rana. Informed her that her appointment for tomorrow has been cancelled.  A new appointment was scheduled for 05/25/2018 @ 1050.   She explained that she wants to go back to the pain clinic at [facility] but now that she does not have medicaid, she can't afford to go there. Her current [employer] does not cover her services. She wants to discuss with Dr Margarita Rana.   She said that she received > $1200/month disability and no longer qualifies for [facility]

## 2018-05-19 NOTE — Telephone Encounter (Signed)
Patient called again to speak with you. Did not disclose in regards to what. Please follow up.

## 2018-05-19 NOTE — Telephone Encounter (Signed)
Met with the patient today.  She is known to [facility] and a transitional care  appointment has been scheduled for 05/25/2018 @ 1050. She stated that her sister will provide transportation for her to the clinic.  She has a RW, cane and wheelchair and nebulizer at home. She is currently ambulating without an assistive device.  She explained that she is now living in a first floor apartment that has 3 steps to enter.  She is alone but has family to check on her and provide support when needed.    Currently she has [employer]. No longer has medicaid as her disability income puts he over income for medicaid. She noted that the [facility] provides medication coverage. Most co-pays are between $10-20. She is most concerned about where she will go for pain management.  She had been going to [facility] and was very pleased but now that she lost medicaid, her [employer] will not cover services at [facility].  She said that she needs to use a [facility] provider. Informed her that this CM would check with [facility] referral specialist regarding options.

## 2018-05-19 NOTE — Progress Notes (Signed)
[facility]                                                                              Patient Demographics  April [name], is a 48 y.o. female, DOB - 1970-10-08, IHW:[id number]  Admit date - 05/15/2018   Admitting Physician [name], MD  Outpatient Primary MD for the patient is [name], MD  Outpatient specialists:   LOS - 1  days   Medical records reviewed and are as summarized below:    Chief Complaint  Patient presents with  . Asthma       Brief summary   Patient is a 48 year old African-American female with history of persistent asthma, history of intubation and ARDS, paroxysmal A. fib on Eliquis, chronic diastolic CHF, history of OSA on CPAP, GERD, hypertension, tobacco abuse presented to ED with shortness of breath and coughing.  She was seen in ED on 1/15 and had improved with nebulizer and steroids.  Subsequently she became more short of breath at rest and eventually came back to the ED and was found to be hypoxic and tachypneic.  Chest x-ray revealed possible pneumonia.  Patient was admitted for pneumonia and acute asthma exacerbation.  Assessment & Plan    Principal Problem:   Acute on chronic persistent asthma with exacerbation -Still wheezing bilaterally, continue scheduled nebs, IV a Zithromax and Rocephin. -Continue IV Solu-Medrol, taper to every 8 hours, continue Pulmicort, Brovana, flutter valve.  Prior history of intubation and ARDS, hence closely monitoring. -Follow sputum culture, urine strep antigen negative, respiratory virus panel negative -Repeat chest x-ray on 05/18/2018 shows no pneumonia  Active Problems:   OSA on CPAP -Noncompliant, refuses CPAP at night  acute on chronic diastolic CHF (congestive heart failure) ([place]) -Chest x-ray with pulmonary edema, BNP 176.5, -2D echo showed normal EF 60 to 65% with grade 2 diastolic dysfunction -Noncompliant with the diet and fluid restriction.  Adamant on having regular diet.     -Follow strict I's and O's, net +1.8 L, will give extra dose of IV Lasix today, continue oral Lasix 40 mg twice a day.  Leukocytosis -Likely secondary to steroids, follow closely afebrile    AF (paroxysmal atrial fibrillation) (HCC) -HR controlled, continue metoprolol, Eliquis   Essential hypertension BP elevated, continue Lasix, increase metoprolol to 50 mg twice daily (home dose)  Normocytic anemia -H&H currently at baseline, follow closely  Chronic thrombocytosis -Likely reactive, follow platelet count  Tobacco abuse -Patient strongly counseled on tobacco cessation, continue nicotine patch  Morbid obesity BMI 48, counseled on diet and weight control  Code Status: full code  DVT Prophylaxis: Eliquis Family Communication: Discussed in detail with the patient, all imaging results, lab results explained to the patient    Disposition Plan: Still wheezing bilaterally, needs inpatient management, on IV steroids Time Spent in minutes 25 minutes  Procedures:  2D echo  Consultants:   None  Antimicrobials:   Anti-infectives (From admission, onward)   Start     Dose/Rate Route Frequency Ordered Stop   05/18/18 1000  azithromycin (ZITHROMAX) tablet 500 mg     500 mg Oral Daily 05/17/18 1109 05/23/18 0959  05/16/18 0600  cefTRIAXone (ROCEPHIN) 1 g in sodium chloride 0.9 % 100 mL IVPB     1 g 200 mL/hr over 30 Minutes Intravenous Every 24 hours 05/16/18 0548 05/23/18 0559   05/16/18 0600  azithromycin (ZITHROMAX) 500 mg in sodium chloride 0.9 % 250 mL IVPB  Status:  Discontinued     500 mg 250 mL/hr over 60 Minutes Intravenous Every 24 hours 05/16/18 0548 05/17/18 1109         Medications  Scheduled Meds: . apixaban  5 mg Oral BID  . arformoterol  15 mcg Nebulization BID  . azithromycin  500 mg Oral Daily  . budesonide (PULMICORT) nebulizer solution  0.25 mg Nebulization BID  . furosemide  40 mg Oral BID  . gabapentin  300 mg Oral BID  . guaiFENesin  1,200 mg  Oral BID  . insulin aspart  0-5 Units Subcutaneous QHS  . insulin aspart  0-9 Units Subcutaneous TID WC  . ipratropium  0.5 mg Nebulization QID  . levalbuterol  1.25 mg Nebulization QID  . Melatonin  1 tablet Oral QHS  . methylPREDNISolone (SOLU-MEDROL) injection  60 mg Intravenous Q6H  . metoprolol tartrate  25 mg Oral BID  . montelukast  10 mg Oral QHS  . nicotine  21 mg Transdermal Daily  . pantoprazole  40 mg Oral Daily  . sodium chloride flush  3 mL Intravenous Q12H  . sodium chloride flush  3 mL Intravenous Q12H   Continuous Infusions: . sodium chloride    . cefTRIAXone (ROCEPHIN)  IV 1 g (05/19/18 0557)   PRN Meds:.sodium chloride, acetaminophen **OR** acetaminophen, albuterol, hydrALAZINE, HYDROcodone-acetaminophen, lip balm, ondansetron **OR** ondansetron (ZOFRAN) IV, polyethylene glycol, sodium chloride flush, zolpidem      Subjective:   April [name] was seen and examined today.  Still wheezing bilaterally, short of breath, no chest pain, fevers.  Does not like the diet or the fluid restriction, adamant for regular diet.  Patient denies dizziness, abdominal pain, N/V/D/C, new weakness, numbess, tingling. No acute events overnight.    Objective:   Vitals:   05/19/18 0757 05/19/18 0800 05/19/18 0855 05/19/18 1127  BP: (!) 175/103     Pulse: 81   67  Resp: 16 (!) 28 17 (!) 22  Temp: 98.3 F (36.8 C)     TempSrc: Oral     SpO2: 97%  97% 99%  Weight:      Height:        Intake/Output Summary (Last 24 hours) at 05/19/2018 1151 Last data filed at 05/19/2018 0855 Gross per 24 hour  Intake 1159 ml  Output 700 ml  Net 459 ml     Wt Readings from Last 3 Encounters:  05/19/18 133.7 kg  03/18/18 134.7 kg  01/22/18 130.2 kg   Physical Exam  General: Alert and oriented x 3, NAD  Eyes:   HEENT:   Cardiovascular: S1 S2 clear, RRR.  1+ pedal edema b/l  Respiratory: Bilateral expiratory wheezing  Gastrointestinal: Obese, soft, nontender, nondistended,  NBS  Ext: 1+  pedal edema bilaterally  Neuro: no new deficits  Musculoskeletal: No cyanosis, clubbing  Skin: No rashes  Psych: Normal affect and demeanor, alert and oriented x3     Data Reviewed:  I have personally reviewed following labs and imaging studies  Micro Results Recent Results (from the past 240 hour(s))  Respiratory Panel by PCR     Status: None   Collection Time: 05/16/18  8:59 AM  Result Value Ref Range Status  Adenovirus NOT DETECTED NOT DETECTED Final   Coronavirus 229E NOT DETECTED NOT DETECTED Final   Coronavirus HKU1 NOT DETECTED NOT DETECTED Final   Coronavirus NL63 NOT DETECTED NOT DETECTED Final   Coronavirus OC43 NOT DETECTED NOT DETECTED Final   Metapneumovirus NOT DETECTED NOT DETECTED Final   Rhinovirus / Enterovirus NOT DETECTED NOT DETECTED Final   Influenza A NOT DETECTED NOT DETECTED Final   Influenza B NOT DETECTED NOT DETECTED Final   Parainfluenza Virus 1 NOT DETECTED NOT DETECTED Final   Parainfluenza Virus 2 NOT DETECTED NOT DETECTED Final   Parainfluenza Virus 3 NOT DETECTED NOT DETECTED Final   Parainfluenza Virus 4 NOT DETECTED NOT DETECTED Final   Respiratory Syncytial Virus NOT DETECTED NOT DETECTED Final   Bordetella pertussis NOT DETECTED NOT DETECTED Final   Chlamydophila pneumoniae NOT DETECTED NOT DETECTED Final   Mycoplasma pneumoniae NOT DETECTED NOT DETECTED Final    Comment: Performed at [facility], [place] [address] [place], [place] [address]  Culture, sputum-assessment     Status: None   Collection Time: 05/16/18  8:52 PM  Result Value Ref Range Status   Specimen Description EXPECTORATED SPUTUM  Final   Special Requests NONE  Final   Sputum evaluation   Final    THIS SPECIMEN IS ACCEPTABLE FOR SPUTUM CULTURE Performed at [facility], [address] [place], [place] [address]    Report Status 05/17/2018 FINAL  Final  Culture, respiratory     Status: None   Collection Time: 05/16/18  8:52 PM  Result  Value Ref Range Status   Specimen Description EXPECTORATED SPUTUM  Final   Special Requests NONE Reflexed from [id number]  Final   Gram Stain   Final    MODERATE WBC PRESENT, PREDOMINANTLY PMN FEW GRAM POSITIVE COCCI IN PAIRS FEW GRAM NEGATIVE COCCOBACILLI FEW GRAM POSITIVE RODS Performed at [facility], [place] [address], [place], [place] [address]    Culture FEW Consistent with normal respiratory flora.  Final   Report Status 05/19/2018 FINAL  Final  MRSA PCR Screening     Status: None   Collection Time: 05/17/18  5:32 PM  Result Value Ref Range Status   MRSA by PCR NEGATIVE NEGATIVE Final    Comment:        The GeneXpert MRSA Assay (FDA approved for NASAL specimens only), is one component of a comprehensive MRSA colonization surveillance program. It is not intended to diagnose MRSA infection nor to guide or monitor treatment for MRSA infections. Performed at [facility], [place] [address], [place], [place] [address]     Radiology Reports Dg Chest 2 View  Result Date: 05/13/2018 CLINICAL DATA:  Cough, shortness of breath, dry cough, history asthma, COPD, CHF, hypertension, former smoker EXAM: CHEST - 2 VIEW COMPARISON:  04/26/2018 FINDINGS: Normal heart size, mediastinal contours, and pulmonary vascularity. Peribronchial thickening and slight accentuation of perihilar markings which may reflect bronchitis or asthma. No definite acute infiltrate, pleural effusion or pneumothorax. Bones unremarkable. IMPRESSION: Changes of bronchitis versus asthma without acute infiltrate. Electronically Signed   By: Lavonia Dana M.D.   On: 05/13/2018 13:07   Dg Chest 2 View  Result Date: 04/26/2018 CLINICAL DATA:  Chest pain for 1 day EXAM: CHEST - 2 VIEW COMPARISON:  March 12, 2018 FINDINGS: The heart size and mediastinal contours are within normal limits. There is no focal infiltrate, pulmonary edema, or pleural effusion. The visualized skeletal structures are unremarkable. IMPRESSION:  No active cardiopulmonary  disease. Electronically Signed   By: Abelardo Diesel M.D.   On: 04/26/2018 01:08   Dg Chest Port 1 View  Result Date: 05/18/2018 CLINICAL DATA:  Shortness of breath for a week EXAM: PORTABLE CHEST 1 VIEW COMPARISON:  Yesterday FINDINGS: Chronic interstitial coarsening with fibrosis seen in the apical lungs on June 2019 chest CT. Given the apical predilection by CT sarcoid is considered, but there is no adenopathy. Chronic cardiomegaly. There is no edema, consolidation, effusion, or pneumothorax. IMPRESSION: 1. No acute finding. 2. Cardiomegaly and apical predominant pulmonary fibrosis. Is there history of sarcoid? Electronically Signed   By: Monte Fantasia M.D.   On: 05/18/2018 06:32   Dg Chest Port 1 View  Result Date: 05/17/2018 CLINICAL DATA:  Dyspnea EXAM: PORTABLE CHEST 1 VIEW COMPARISON:  05/16/2018 chest radiograph. FINDINGS: Stable cardiomediastinal silhouette with mild cardiomegaly. No pneumothorax. No pleural effusion. Increased hazy linear parahilar opacity in both lungs. IMPRESSION: Mild cardiomegaly. Increased hazy linear parahilar opacity in both lungs, favor mild pulmonary edema due to congestive heart failure. Electronically Signed   By: Ilona Sorrel M.D.   On: 05/17/2018 07:02   Dg Chest Port 1 View  Result Date: 05/16/2018 CLINICAL DATA:  Dyspnea, smoker EXAM: PORTABLE CHEST 1 VIEW COMPARISON:  05/13/2018 chest radiograph. FINDINGS: Stable cardiomediastinal silhouette with normal heart size. No pneumothorax. No pleural effusion. Hazy curvilinear opacities in the lingula silhouettes the left heart border, unchanged. No overt pulmonary edema. IMPRESSION: Stable hazy curvilinear opacities in the lingula silhouetting the left heart border, suggesting atelectasis or mild pneumonia. Recommend follow-up PA and lateral post treatment chest radiographs in 4-6 weeks. Electronically Signed   By: Ilona Sorrel M.D.   On: 05/16/2018 04:20   Vas [place] Lower Extremity Venous  (dvt) (only [facility])  Result Date: 05/13/2018  Lower Venous Study Indications: Edema.  Limitations: Body habitus. Performing Technologist: Abram Sander RVS  Examination Guidelines: A complete evaluation includes B-mode imaging, spectral Doppler, color Doppler, and power Doppler as needed of all accessible portions of each vessel. Bilateral testing is considered an integral part of a complete examination. Limited examinations for reoccurring indications may be performed as noted.  Right Venous Findings: +---------+---------------+---------+-----------+----------+--------------+          CompressibilityPhasicitySpontaneityPropertiesSummary        +---------+---------------+---------+-----------+----------+--------------+ CFV      Full           Yes      Yes                                 +---------+---------------+---------+-----------+----------+--------------+ SFJ      Full                                                        +---------+---------------+---------+-----------+----------+--------------+ FV Prox  Full                                                        +---------+---------------+---------+-----------+----------+--------------+ FV Mid   Full                                                        +---------+---------------+---------+-----------+----------+--------------+  FV DistalFull                                                        +---------+---------------+---------+-----------+----------+--------------+ PFV      Full                                                        +---------+---------------+---------+-----------+----------+--------------+ POP      Full           Yes      Yes                                 +---------+---------------+---------+-----------+----------+--------------+ PTV      Full                                                         +---------+---------------+---------+-----------+----------+--------------+ PERO                                                  Not visualized +---------+---------------+---------+-----------+----------+--------------+  Left Venous Findings: +---+---------------+---------+-----------+----------+-------+    CompressibilityPhasicitySpontaneityPropertiesSummary +---+---------------+---------+-----------+----------+-------+ CFVFull           Yes      Yes                          +---+---------------+---------+-----------+----------+-------+    Summary: Right: There is no evidence of deep vein thrombosis in the lower extremity. A cystic structure is found in the popliteal fossa. Left: No evidence of common femoral vein obstruction.  *See table(s) above for measurements and observations. Electronically signed by Curt Jews MD on 05/13/2018 at 8:04:10 PM.    Final     Lab Data:  CBC: Recent Labs  Lab 05/13/18 1412 05/16/18 0143 05/17/18 0447 05/18/18 0307  WBC 15.4* 14.7* 17.0* 17.0*  NEUTROABS 10.6* 13.0* 13.6* 13.5*  HGB 11.5* 11.8* 11.9* 11.9*  HCT 36.9 38.7 38.3 37.8  MCV 82.7 82.5 82.2 82.0  PLT 460* 475* 522* 299*   Basic Metabolic Panel: Recent Labs  Lab 05/13/18 1412 05/16/18 0143 05/17/18 0447 05/18/18 0307  NA 136 135 136 137  K 3.3* 3.9 4.1 3.4*  CL 102 104 104 104  CO2 25 20* 22 22  GLUCOSE 101* 167* 141* 145*  BUN <5* 10 16 16   CREATININE 0.63 0.66 0.71 0.88  CALCIUM 8.6* 8.8* 8.9 8.4*  MG  --   --  2.2 2.1  PHOS  --   --  3.2 2.7   GFR: Estimated Creatinine Clearance: 113.6 mL/min (by C-G formula based on SCr of 0.88 mg/dL). Liver Function Tests: Recent Labs  Lab 05/17/18 0447 05/18/18 0307  AST 12* 13*  ALT 8 10  ALKPHOS 69 67  BILITOT 0.5 0.2*  PROT 7.8 7.6  ALBUMIN 3.2* 3.2*  No results for input(s): LIPASE, AMYLASE in the last 168 hours. No results for input(s): AMMONIA in the last 168 hours. Coagulation Profile: No results for  input(s): INR, PROTIME in the last 168 hours. Cardiac Enzymes: No results for input(s): CKTOTAL, CKMB, CKMBINDEX, TROPONINI in the last 168 hours. BNP (last 3 results) No results for input(s): PROBNP in the last 8760 hours. HbA1C: Recent Labs    05/17/18 0447  HGBA1C 5.4   CBG: Recent Labs  Lab 05/18/18 1208 05/18/18 1703 05/18/18 2059 05/19/18 0757 05/19/18 1146  GLUCAP 132* 163* 204* 131* 157*   Lipid Profile: No results for input(s): CHOL, HDL, LDLCALC, TRIG, CHOLHDL, LDLDIRECT in the last 72 hours. Thyroid Function Tests: No results for input(s): TSH, T4TOTAL, FREET4, T3FREE, THYROIDAB in the last 72 hours. Anemia Panel: No results for input(s): VITAMINB12, FOLATE, FERRITIN, TIBC, IRON, RETICCTPCT in the last 72 hours. Urine analysis:    Component Value Date/Time   COLORURINE YELLOW 10/13/2017 1100   APPEARANCEUR CLEAR 10/13/2017 1100   LABSPEC 1.005 10/13/2017 1100   PHURINE 5.0 10/13/2017 1100   GLUCOSEU NEGATIVE 10/13/2017 1100   GLUCOSEU NEG mg/dL 10/28/2007 2049   HGBUR SMALL (A) 10/13/2017 1100   BILIRUBINUR NEGATIVE 10/13/2017 1100   KETONESUR NEGATIVE 10/13/2017 1100   PROTEINUR NEGATIVE 10/13/2017 1100   UROBILINOGEN 1.0 11/21/2014 0707   NITRITE NEGATIVE 10/13/2017 1100   LEUKOCYTESUR SMALL (A) 10/13/2017 1100     Leodan Bolyard M.D. [facility] 05/19/2018, 11:51 AM  Pager: [phone] Between 7am to 7pm - call Pager - [phone]  After 7pm go to [email or web address] - password [email or web address]  Call night coverage person covering after 7pm

## 2018-05-20 ENCOUNTER — Inpatient Hospital Stay: Payer: Self-pay | Admitting: Family Medicine

## 2018-05-20 ENCOUNTER — Telehealth: Payer: Self-pay

## 2018-05-20 DIAGNOSIS — M25562 Pain in left knee: Principal | ICD-10-CM

## 2018-05-20 DIAGNOSIS — M25561 Pain in right knee: Principal | ICD-10-CM

## 2018-05-20 DIAGNOSIS — G8929 Other chronic pain: Secondary | ICD-10-CM

## 2018-05-20 LAB — BASIC METABOLIC PANEL
Anion gap: 10 (ref 5–15)
BUN: 20 mg/dL (ref 6–20)
CO2: 22 mmol/L (ref 22–32)
Calcium: 7.6 mg/dL — ABNORMAL LOW (ref 8.9–10.3)
Chloride: 106 mmol/L (ref 98–111)
Creatinine, Ser: 0.99 mg/dL (ref 0.44–1.00)
GFR calc Af Amer: >60 mL/min (ref >60)
GFR calc non Af Amer: >60 mL/min (ref >60)
Glucose, Bld: 148 mg/dL — ABNORMAL HIGH (ref 70–99)
Potassium: 3 mmol/L — ABNORMAL LOW (ref 3.5–5.1)
Sodium: 138 mmol/L (ref 135–145)

## 2018-05-20 LAB — CBC
HCT: 37.8 % (ref 36.0–46.0)
Hemoglobin: 11.6 g/dL — ABNORMAL LOW (ref 12.0–15.0)
MCH: 25.1 pg — ABNORMAL LOW (ref 26.0–34.0)
MCHC: 30.7 g/dL (ref 30.0–36.0)
MCV: 81.8 fL (ref 80.0–100.0)
PLATELETS: 531 10*3/uL — AB (ref 150–400)
RBC: 4.62 MIL/uL (ref 3.87–5.11)
RDW: 18.7 % — ABNORMAL HIGH (ref 11.5–15.5)
WBC: 17.5 10*3/uL — ABNORMAL HIGH (ref 4.0–10.5)
nRBC: 0 % (ref 0.0–0.2)

## 2018-05-20 LAB — POTASSIUM: Potassium: 3.1 mmol/L — ABNORMAL LOW (ref 3.5–5.1)

## 2018-05-20 LAB — GLUCOSE, CAPILLARY
Glucose-Capillary: 147 mg/dL — ABNORMAL HIGH (ref 70–99)
Glucose-Capillary: 188 mg/dL — ABNORMAL HIGH (ref 70–99)

## 2018-05-20 MED ORDER — POLYETHYLENE GLYCOL 3350 17 G PO PACK: 17.0000 g | PACK | Freq: Every day | ORAL | 0 refills | Status: DC | PRN

## 2018-05-20 MED ORDER — AZITHROMYCIN 250 MG PO TABS: 250.0000 mg | ORAL_TABLET | Freq: Every day | ORAL | 0 refills | Status: AC

## 2018-05-20 MED ORDER — GUAIFENESIN ER 600 MG PO TB12: 1200.0000 mg | ORAL_TABLET | Freq: Two times a day (BID) | ORAL | 0 refills | Status: DC

## 2018-05-20 MED ORDER — METHYLPREDNISOLONE SODIUM SUCC 125 MG IJ SOLR: 60.0000 mg | Freq: Two times a day (BID) | INTRAMUSCULAR | Status: DC

## 2018-05-20 MED ORDER — NICOTINE 21 MG/24HR TD PT24: 21.0000 mg | MEDICATED_PATCH | Freq: Every day | TRANSDERMAL | 0 refills | Status: DC

## 2018-05-20 MED ORDER — POTASSIUM CHLORIDE CRYS ER 20 MEQ PO TBCR
60.0000 meq | EXTENDED_RELEASE_TABLET | Freq: Once | ORAL | Status: AC
  Administered 2018-05-20: 60 meq via ORAL
  Filled 2018-05-20: qty 3

## 2018-05-20 MED ORDER — METOPROLOL TARTRATE 50 MG PO TABS: 50.0000 mg | ORAL_TABLET | Freq: Two times a day (BID) | ORAL | 0 refills | Status: DC

## 2018-05-20 MED ORDER — PREDNISONE 20 MG PO TABS: ORAL_TABLET | ORAL | 0 refills | Status: DC

## 2018-05-20 MED ORDER — CEFUROXIME AXETIL 500 MG PO TABS: 500.0000 mg | ORAL_TABLET | Freq: Two times a day (BID) | ORAL | 0 refills | Status: AC

## 2018-05-20 NOTE — Progress Notes (Signed)
Patient left AMA. States "My ride is outside, I'm leaving now". Encouraged patient to wait for discharge orders with no success. Patient states that we took good care of her, but that her ride is waiting and has to go.

## 2018-05-20 NOTE — Discharge Summary (Signed)
Physician Discharge Summary  April [name] TOI:[id number] DOB: 1970-07-03 DOA: 05/15/2018  PCP: [name], MD  Admit date: 05/15/2018 Discharge date: 05/20/2018  Time spent: 45 minutes  Recommendations for Outpatient Follow-up:  None Patient left AMA before getting discharge instructions. However medications had already been sent to her pharmacy.   Discharge Diagnoses:  Principal Problem:   Acute asthma exacerbation Active Problems:   OSA on CPAP   Acute on Chronic diastolic CHF (congestive heart failure) (HCC)   AF (paroxysmal atrial fibrillation) (HCC)   Essential hypertension   Normocytic anemia   Chronic thrombocytosis   Tobacco abuse   Morbid obesity   Hypokalemia   Chronic pain  Discharge Condition: Stable  Diet recommendation: heart healthy  Filed Weights   05/17/18 0500 05/18/18 0500 05/19/18 0500  Weight: 133.8 kg 135 kg 133.7 kg    History of present illness:  On 05/16/2018 by Dr. Christia Reading Opyd April [name] is a 48 y.o. female with medical history significant for persistent asthma with history of intubation, history of ARDS, paroxysmal atrial fibrillation on Eliquis, chronic diastolic CHF, OSA on CPAP, and now presenting to the emergency department for evaluation of shortness of breath and cough.  Patient was seen for the same complaints on 05/13/2018, improved with nebs and steroids in the ED, and was able to go home at that time.  She did well at home initially, but slowly began to worsen again.  Home breathing treatments were no longer providing much relief, she was short of breath at rest, and eventually came back in for evaluation. Cough has been non-productive. She denies chest pain. Denies leg swelling or tenderness. No abdominal pain.  Hospital Course:  Acute on chronic persistent asthma with exacerbation -On exam today, wheezing has improved -Patient was placed on IV Solu-Medrol, azithromycin, ceftriaxone, Pulmicort, Brovana, flutter valve -Patient does  have a history of ARDS and did require intubation -Sputum culture, urine strep antigen, respiratory viral panel- unremarkable -Repeat chest x-ray on 05/18/2018, reviewed and unremarkable for infection -Will discharge patient with prednisone taper  Acute on chronic diastolic heart failure -Chest x-ray showed pulmonary edema, BNP 176.5 -Echocardiogram showed an EF of 60 to 83%, grade 2 diastolic dysfunction -Patient has been noncompliant with diet and fluid restriction and was adamant on having a regular diet (per documentation from previous hospitalist) -Patient was given IV Lasix and transition to oral  OSA -Patient appears to be noncompliant and refuses CPAP at night  Atrial fibrillation, paroxysmal -Heart rate appears to be controlled, continue metoprolol and Eliquis  Essential hypertension -Continue metoprolol, Lasix  Normocytic anemia -Hemoglobin has remained stable  Chronic thrombocytosis -Possibly reactive, patient does have history of tobacco abuse  Tobacco abuse -Smoking cessation was discussed, continue nicotine patch  Morbid obesity -BMI 48, patient given counseling regarding diet and weight control -She should follow-up with her primary care physician to discuss this further  Hypokalemia -Likely secondary to Lasix -Potassium replaced, currently 3.1 -continue potassium supplementation -Repeat BMP in 1 week  Chronic pain -Patient was following with pain management clinic however after switching insurance companies, she can no longer see that clinic.  Case management was consulted. -Patient asking for medications to go home with.  Find our policy regarding chronic pain and prescriptions. -Reviewed [place] controlled database: Patient filled on 120 tablets of oxycodone on 05/04/2018.  Procedures: Echocardiogram  Consultations: None  Discharge Exam: Vitals:   05/20/18 0745 05/20/18 1115  BP:    Pulse:    Resp:    Temp:  SpO2: 99% 98%   Patient states she is  feeling much better and would like to go home today.  Denies further cough or wheezing.  Denies current chest pain, abdominal pain, nausea or vomiting, diarrhea or constipation, dizziness or headache.  Does complain of chronic leg pain.   General: Well developed, well nourished, NAD, appears stated age  HEENT: NCAT, mucous membranes moist.  Neck: Supple  Cardiovascular: S1 S2 auscultated, no rubs, murmurs or gallops. Regular rate and rhythm.  Respiratory: Clear to auscultation bilaterally with equal chest rise  Abdomen: Soft, obese, nontender, nondistended, + bowel sounds  Extremities: warm dry without cyanosis clubbing.   Neuro: AAOx3, nonfocal  Psych: Normal affect and demeanor with intact judgement and insight  Discharge Instructions  Allergies as of 05/20/2018      Reactions   Contrast Media [iodinated Diagnostic Agents] Itching   Ct contrast   Oxycodone-acetaminophen Itching      Medication List    STOP taking these medications   amLODipine 5 MG tablet Commonly known as:  NORVASC   HYDROcodone-acetaminophen 10-325 MG tablet Commonly known as:  NORCO     TAKE these medications   azithromycin 250 MG tablet Commonly known as:  ZITHROMAX Take 1 tablet (250 mg total) by mouth daily for 2 days. Start taking on:  May 21, 2018   cefUROXime 500 MG tablet Commonly known as:  CEFTIN Take 1 tablet (500 mg total) by mouth 2 (two) times daily for 2 days. Start taking on:  May 21, 2018   cetirizine 10 MG tablet Commonly known as:  ZYRTEC Take 10 mg by mouth daily.   ELIQUIS 5 MG Tabs tablet Generic drug:  apixaban Take 5 mg by mouth 2 (two) times daily.   fluticasone 50 MCG/ACT nasal spray Commonly known as:  FLONASE Place 1 spray into both nostrils daily.   furosemide 40 MG tablet Commonly known as:  LASIX Take 40 mg by mouth daily.   gabapentin 300 MG capsule Commonly known as:  NEURONTIN Take 1 capsule (300 mg total) by mouth 2 (two) times daily.    guaiFENesin 600 MG 12 hr tablet Commonly known as:  MUCINEX Take 2 tablets (1,200 mg total) by mouth 2 (two) times daily.   ipratropium-albuterol 0.5-2.5 (3) MG/3ML Soln Commonly known as:  DUONEB Inhale 3 ml two times daily and every 2 hours as needed for shortness of breath or wheezing Inhale 3 ml two times daily and every 2 hours as needed for shortness of breath or wheezing   Iron 325 (65 Fe) MG Tabs Take 1 tablet (325 mg total) by mouth every morning. What changed:  when to take this   Melatonin 3 MG Tabs Take 1 tablet by mouth at bedtime.   metoprolol tartrate 50 MG tablet Commonly known as:  LOPRESSOR Take 1 tablet (50 mg total) by mouth 2 (two) times daily. What changed:    medication strength  how much to take  when to take this  additional instructions   montelukast 10 MG tablet Commonly known as:  SINGULAIR Take 10 mg by mouth at bedtime.   nicotine 21 mg/24hr patch Commonly known as:  NICODERM CQ - dosed in mg/24 hours Place 1 patch (21 mg total) onto the skin daily. Start taking on:  May 21, 2018   omeprazole 20 MG capsule Commonly known as:  PRILOSEC TAKE 1 CAPSULE BY MOUTH EVERY DAY What changed:  how much to take   oxyCODONE-acetaminophen 10-325 MG tablet Commonly known as:  PERCOCET Take 1 tablet by mouth every 4 (four) hours as needed for pain.   polyethylene glycol packet Commonly known as:  MIRALAX / GLYCOLAX Take 17 g by mouth daily as needed for mild constipation.   potassium chloride SA 20 MEQ tablet Commonly known as:  K-DUR,KLOR-CON Take 1 tablet (20 mEq total) by mouth daily.   predniSONE 20 MG tablet Commonly known as:  DELTASONE Take in the morning. Take 3 tabs x 3 days, then 2 tabs x 3 days, then 1 tab x 3 days. What changed:    how much to take  how to take this  when to take this  additional instructions   PROAIR HFA 108 (90 Base) MCG/ACT inhaler Generic drug:  albuterol Inhale 2 puffs into the lungs every 4  (four) hours as needed for wheezing.   albuterol (2.5 MG/3ML) 0.083% nebulizer solution Commonly known as:  PROVENTIL Take 3 mLs (2.5 mg total) by nebulization every 6 (six) hours as needed for wheezing or shortness of breath.   zolpidem 5 MG tablet Commonly known as:  AMBIEN TAKE 1 TABLET(5 MG) BY MOUTH AT BEDTIME      Allergies  Allergen Reactions  . Contrast Media [Iodinated Diagnostic Agents] Itching    Ct contrast  . Oxycodone-Acetaminophen Itching   Follow-up Information    [facility] Follow up on 06/02/2018.   Why:  9:50 for hospital follow up Contact information: [address] [place] [address] [phone]           The results of significant diagnostics from this hospitalization (including imaging, microbiology, ancillary and laboratory) are listed below for reference.    Significant Diagnostic Studies: Dg Chest 2 View  Result Date: 05/13/2018 CLINICAL DATA:  Cough, shortness of breath, dry cough, history asthma, COPD, CHF, hypertension, former smoker EXAM: CHEST - 2 VIEW COMPARISON:  04/26/2018 FINDINGS: Normal heart size, mediastinal contours, and pulmonary vascularity. Peribronchial thickening and slight accentuation of perihilar markings which may reflect bronchitis or asthma. No definite acute infiltrate, pleural effusion or pneumothorax. Bones unremarkable. IMPRESSION: Changes of bronchitis versus asthma without acute infiltrate. Electronically Signed   By: Lavonia Dana M.D.   On: 05/13/2018 13:07   Dg Chest 2 View  Result Date: 04/26/2018 CLINICAL DATA:  Chest pain for 1 day EXAM: CHEST - 2 VIEW COMPARISON:  March 12, 2018 FINDINGS: The heart size and mediastinal contours are within normal limits. There is no focal infiltrate, pulmonary edema, or pleural effusion. The visualized skeletal structures are unremarkable. IMPRESSION: No active cardiopulmonary disease. Electronically Signed   By: Abelardo Diesel  M.D.   On: 04/26/2018 01:08   Dg Chest Port 1 View  Result Date: 05/18/2018 CLINICAL DATA:  Shortness of breath for a week EXAM: PORTABLE CHEST 1 VIEW COMPARISON:  Yesterday FINDINGS: Chronic interstitial coarsening with fibrosis seen in the apical lungs on June 2019 chest CT. Given the apical predilection by CT sarcoid is considered, but there is no adenopathy. Chronic cardiomegaly. There is no edema, consolidation, effusion, or pneumothorax. IMPRESSION: 1. No acute finding. 2. Cardiomegaly and apical predominant pulmonary fibrosis. Is there history of sarcoid? Electronically Signed   By: Monte Fantasia M.D.   On: 05/18/2018 06:32   Dg Chest Port 1 View  Result Date: 05/17/2018 CLINICAL DATA:  Dyspnea EXAM: PORTABLE CHEST 1 VIEW COMPARISON:  05/16/2018 chest radiograph. FINDINGS: Stable cardiomediastinal silhouette with mild cardiomegaly. No pneumothorax. No pleural effusion. Increased hazy linear parahilar opacity in both lungs. IMPRESSION:  Mild cardiomegaly. Increased hazy linear parahilar opacity in both lungs, favor mild pulmonary edema due to congestive heart failure. Electronically Signed   By: Ilona Sorrel M.D.   On: 05/17/2018 07:02   Dg Chest Port 1 View  Result Date: 05/16/2018 CLINICAL DATA:  Dyspnea, smoker EXAM: PORTABLE CHEST 1 VIEW COMPARISON:  05/13/2018 chest radiograph. FINDINGS: Stable cardiomediastinal silhouette with normal heart size. No pneumothorax. No pleural effusion. Hazy curvilinear opacities in the lingula silhouettes the left heart border, unchanged. No overt pulmonary edema. IMPRESSION: Stable hazy curvilinear opacities in the lingula silhouetting the left heart border, suggesting atelectasis or mild pneumonia. Recommend follow-up PA and lateral post treatment chest radiographs in 4-6 weeks. Electronically Signed   By: Ilona Sorrel M.D.   On: 05/16/2018 04:20   Vas [place] Lower Extremity Venous (dvt) (only [facility])  Result Date: 05/13/2018  Lower Venous Study Indications:  Edema.  Limitations: Body habitus. Performing Technologist: Abram Sander RVS  Examination Guidelines: A complete evaluation includes B-mode imaging, spectral Doppler, color Doppler, and power Doppler as needed of all accessible portions of each vessel. Bilateral testing is considered an integral part of a complete examination. Limited examinations for reoccurring indications may be performed as noted.  Right Venous Findings: +---------+---------------+---------+-----------+----------+--------------+          CompressibilityPhasicitySpontaneityPropertiesSummary        +---------+---------------+---------+-----------+----------+--------------+ CFV      Full           Yes      Yes                                 +---------+---------------+---------+-----------+----------+--------------+ SFJ      Full                                                        +---------+---------------+---------+-----------+----------+--------------+ FV Prox  Full                                                        +---------+---------------+---------+-----------+----------+--------------+ FV Mid   Full                                                        +---------+---------------+---------+-----------+----------+--------------+ FV DistalFull                                                        +---------+---------------+---------+-----------+----------+--------------+ PFV      Full                                                        +---------+---------------+---------+-----------+----------+--------------+ POP  Full           Yes      Yes                                 +---------+---------------+---------+-----------+----------+--------------+ PTV      Full                                                        +---------+---------------+---------+-----------+----------+--------------+ PERO                                                  Not visualized  +---------+---------------+---------+-----------+----------+--------------+  Left Venous Findings: +---+---------------+---------+-----------+----------+-------+    CompressibilityPhasicitySpontaneityPropertiesSummary +---+---------------+---------+-----------+----------+-------+ CFVFull           Yes      Yes                          +---+---------------+---------+-----------+----------+-------+    Summary: Right: There is no evidence of deep vein thrombosis in the lower extremity. A cystic structure is found in the popliteal fossa. Left: No evidence of common femoral vein obstruction.  *See table(s) above for measurements and observations. Electronically signed by Curt Jews MD on 05/13/2018 at 8:04:10 PM.    Final     Microbiology: Recent Results (from the past 240 hour(s))  Respiratory Panel by PCR     Status: None   Collection Time: 05/16/18  8:59 AM  Result Value Ref Range Status   Adenovirus NOT DETECTED NOT DETECTED Final   Coronavirus 229E NOT DETECTED NOT DETECTED Final   Coronavirus HKU1 NOT DETECTED NOT DETECTED Final   Coronavirus NL63 NOT DETECTED NOT DETECTED Final   Coronavirus OC43 NOT DETECTED NOT DETECTED Final   Metapneumovirus NOT DETECTED NOT DETECTED Final   Rhinovirus / Enterovirus NOT DETECTED NOT DETECTED Final   Influenza A NOT DETECTED NOT DETECTED Final   Influenza B NOT DETECTED NOT DETECTED Final   Parainfluenza Virus 1 NOT DETECTED NOT DETECTED Final   Parainfluenza Virus 2 NOT DETECTED NOT DETECTED Final   Parainfluenza Virus 3 NOT DETECTED NOT DETECTED Final   Parainfluenza Virus 4 NOT DETECTED NOT DETECTED Final   Respiratory Syncytial Virus NOT DETECTED NOT DETECTED Final   Bordetella pertussis NOT DETECTED NOT DETECTED Final   Chlamydophila pneumoniae NOT DETECTED NOT DETECTED Final   Mycoplasma pneumoniae NOT DETECTED NOT DETECTED Final    Comment: Performed at [facility], [address] [place], [place] [address]  Culture,  sputum-assessment     Status: None   Collection Time: 05/16/18  8:52 PM  Result Value Ref Range Status   Specimen Description EXPECTORATED SPUTUM  Final   Special Requests NONE  Final   Sputum evaluation   Final    THIS SPECIMEN IS ACCEPTABLE FOR SPUTUM CULTURE Performed at [facility], [address], [place], [place] [address]    Report Status 05/17/2018 FINAL  Final  Culture, respiratory     Status: None   Collection Time: 05/16/18  8:52 PM  Result Value Ref Range Status   Specimen Description EXPECTORATED SPUTUM  Final  Special Requests NONE Reflexed from [id number]  Final   Gram Stain   Final    MODERATE WBC PRESENT, PREDOMINANTLY PMN FEW GRAM POSITIVE COCCI IN PAIRS FEW GRAM NEGATIVE COCCOBACILLI FEW GRAM POSITIVE RODS Performed at [facility], [place] [address] [place], [place] [address]    Culture FEW Consistent with normal respiratory flora.  Final   Report Status 05/19/2018 FINAL  Final  MRSA PCR Screening     Status: None   Collection Time: 05/17/18  5:32 PM  Result Value Ref Range Status   MRSA by PCR NEGATIVE NEGATIVE Final    Comment:        The GeneXpert MRSA Assay (FDA approved for NASAL specimens only), is one component of a comprehensive MRSA colonization surveillance program. It is not intended to diagnose MRSA infection nor to guide or monitor treatment for MRSA infections. Performed at [facility], [place] [address] [place],  [address]      Labs: Basic Metabolic Panel: Recent Labs  Lab 05/13/18 1412 05/16/18 0143 05/17/18 0447 05/18/18 0307 05/20/18 0558  NA 136 135 136 137 138  K 3.3* 3.9 4.1 3.4* 3.0*  CL 102 104 104 104 106  CO2 25 20* 22 22 22   GLUCOSE 101* 167* 141* 145* 148*  BUN <5* 10 16 16 20   CREATININE 0.63 0.66 0.71 0.88 0.99  CALCIUM 8.6* 8.8* 8.9 8.4* 7.6*  MG  --   --  2.2 2.1  --   PHOS  --   --  3.2 2.7  --    Liver Function Tests: Recent Labs  Lab 05/17/18 0447 05/18/18 0307  AST 12* 13*    ALT 8 10  ALKPHOS 69 67  BILITOT 0.5 0.2*  PROT 7.8 7.6  ALBUMIN 3.2* 3.2*   No results for input(s): LIPASE, AMYLASE in the last 168 hours. No results for input(s): AMMONIA in the last 168 hours. CBC: Recent Labs  Lab 05/13/18 1412 05/16/18 0143 05/17/18 0447 05/18/18 0307 05/20/18 0701  WBC 15.4* 14.7* 17.0* 17.0* 17.5*  NEUTROABS 10.6* 13.0* 13.6* 13.5*  --   HGB 11.5* 11.8* 11.9* 11.9* 11.6*  HCT 36.9 38.7 38.3 37.8 37.8  MCV 82.7 82.5 82.2 82.0 81.8  PLT 460* 475* 522* 543* 531*   Cardiac Enzymes: No results for input(s): CKTOTAL, CKMB, CKMBINDEX, TROPONINI in the last 168 hours. BNP: BNP (last 3 results) Recent Labs    08/14/17 2020 10/08/17 1244 05/17/18 0447  BNP 17.2 29.9 176.5*    ProBNP (last 3 results) No results for input(s): PROBNP in the last 8760 hours.  CBG: Recent Labs  Lab 05/19/18 0757 05/19/18 1146 05/19/18 1719 05/19/18 2110 05/20/18 0726  GLUCAP 131* 157* 169* 183* 147*       Signed:  Chinaza Rooke  [facility] 05/20/2018, 11:19 AM

## 2018-05-20 NOTE — Progress Notes (Signed)
Patient ambulated around the unit on RA. O2 saturations maintained in the 90's. Tolerated ambulation well with no SOB noted. VS stable at this time.

## 2018-05-20 NOTE — Telephone Encounter (Signed)
Call received from the patient stating she left the hospital AMA.  She said that she was told she was to be discharged and called her ride.  The ride came to pick her up and the staff told her that she needed to stay to wait for results of her potassium but she could not wait.  She also said that she is experiencing a lot of pain in her legs and feet. She was going to [facility] pain clinic and was very pleased. She now has new insurance that does not cover [place] and she is inquiring about alternatives.  She said that as per her insurance the facility needs to be associated with [facility].  Informed her that Dr Margarita Rana would be informed of her status. She has an appointment at [facility] on 05/25/2018

## 2018-05-21 ENCOUNTER — Other Ambulatory Visit: Payer: Self-pay

## 2018-05-21 ENCOUNTER — Emergency Department (HOSPITAL_COMMUNITY)
Admission: EM | Admit: 2018-05-21 | Discharge: 2018-05-22 | Disposition: A | Discharge status: Home / Self Care | Payer: BLUE CROSS/BLUE SHIELD | Attending: Emergency Medicine | Admitting: Emergency Medicine

## 2018-05-21 ENCOUNTER — Telehealth: Payer: Self-pay

## 2018-05-21 ENCOUNTER — Emergency Department (HOSPITAL_COMMUNITY): Payer: BLUE CROSS/BLUE SHIELD

## 2018-05-21 DIAGNOSIS — J45909 Unspecified asthma, uncomplicated: Secondary | ICD-10-CM | POA: Insufficient documentation

## 2018-05-21 DIAGNOSIS — Z5321 Procedure and treatment not carried out due to patient leaving prior to being seen by health care provider: Secondary | ICD-10-CM | POA: Insufficient documentation

## 2018-05-21 MED ORDER — ALBUTEROL SULFATE (2.5 MG/3ML) 0.083% IN NEBU
5.0000 mg | INHALATION_SOLUTION | Freq: Once | RESPIRATORY_TRACT | Status: AC
  Administered 2018-05-21: 5 mg via RESPIRATORY_TRACT
  Filled 2018-05-21: qty 6

## 2018-05-21 NOTE — Telephone Encounter (Signed)
Alinda Sierras, could you please refer to Pain Clinic associated with [facility] as per [place] notes?Thanks.

## 2018-05-21 NOTE — ED Triage Notes (Signed)
Pt endorses asthma problems. Pt was admitted for 5 days recently and d/c home yesterday after being diagnosed with pneumonia. Pt now complains of URI sx and sore throat. Speaking in complete sentences. VSS

## 2018-05-21 NOTE — ED Notes (Signed)
Pt brought back for re-eval, states her "asthma is acting up, I am hurting all over and feeling really weak". Reeval respiratory status, discussed with pt drawing labs at this time, pt does not want labs drawn because she just got them done yesterday. Updated pt on delay at this time, pt agreeable. Comfort measures.

## 2018-05-21 NOTE — ED Notes (Signed)
No reply for room x4

## 2018-05-21 NOTE — Telephone Encounter (Signed)
Dr Margarita Rana - from the discharge call.   She understands that she left AMA yesterday.  Her appointment with you is 05/25/2018  She is concerned about her breathing. She said that she is " not feeling good."  She explained that her throat is hurting and her wheezing has increased.  She said that she has done nebulizer treatments 3-4 times today with no improvement and she does not feel any better. Explained to her that she left the hospital prior to being discharged.  She said that she understood and thinks she is heading back to the hospital. She recalls how sick she was last hospitalized and she fears becoming that sick again. She said that she will probably need to go to the ED.

## 2018-05-21 NOTE — Telephone Encounter (Signed)
Pt called stating she still is not feeling well and she also never got her lab results from the hospital. Confirmed pt appt Monday 05/25/18 with Dr. Margarita Rana. Pt instructed to go to the hospital if she feels symptoms are emergent. Please review labs and call pt to give results. Confirmed ph on acct is correct.

## 2018-05-21 NOTE — Telephone Encounter (Signed)
Transition Care Management Follow-up Telephone Call  Date of discharge and from where: 05/20/2018, [facility].  Left AMA  How have you been since you were released from the hospital? " not good"  - refer to next question.  Any questions or concerns? She is concerned about her breathing. She said that she is " not feeling good."  She explained that her throat is hurting and her wheezing has increased.  She said that she has done nebulizer treatments 3-4 times today with no improvement and she does not feel any better. Explained to her that she left the hospital prior to being discharged.  She said that she understood and thinks she is heading back to the hospital. She recalls how sick she was last hospitalized and she fears becoming that sick again. She said that she will probably need to go to the ED.    Items Reviewed:  Did the pt receive and understand the discharge instructions provided?   No, she left AMA  Medications obtained and verified? She left without AVS because she left AMA. She stated that she has all medications that she was taking prior to her hospitalization.  She said that she recently  picked up prednisone and knows that all of her medications were sent to the pharmacy prior to her leaving the hospital.  Stressed the importance of coming to her appointment on 05/25/2018 so Dr Margarita Rana can review medications as she has no current list of medications.    Any new allergies since your discharge?none reported  Support at home? Lives alone.  Has family in the area  Other (ie: DME, Home Health, etc) has RW, cane, wheelchair, nebulizer.  No home health ordered  Functional Questionnaire: (I = Independent and D = Dependent) ADL's: indepenendent   Follow up appointments reviewed:    PCP Hospital f/u appt confirmed? 05/25/2018 @ 1050 with Dr [facility] f/u appt confirmed? None scheduled at this time  Are transportation arrangements needed? She did not  report any need for transportation to her appointment on 1/27/202  If their condition worsens, is the pt aware to call  their PCP or go to the ED? yes  Was the patient provided with contact information for the PCP's office or ED? She has the phone # for [facility]  Was the pt encouraged to call back with questions or concerns? yes

## 2018-05-22 ENCOUNTER — Encounter (HOSPITAL_COMMUNITY): Payer: Self-pay | Admitting: Emergency Medicine

## 2018-05-22 ENCOUNTER — Ambulatory Visit (INDEPENDENT_AMBULATORY_CARE_PROVIDER_SITE_OTHER)
Admission: EM | Admit: 2018-05-22 | Discharge: 2018-05-22 | Disposition: A | Discharge status: Home / Self Care | Payer: BLUE CROSS/BLUE SHIELD | Source: Home / Self Care | Attending: Internal Medicine | Admitting: Internal Medicine

## 2018-05-22 DIAGNOSIS — M79605 Pain in left leg: Secondary | ICD-10-CM | POA: Diagnosis not present

## 2018-05-22 DIAGNOSIS — M79604 Pain in right leg: Secondary | ICD-10-CM

## 2018-05-22 NOTE — Telephone Encounter (Signed)
Noted  Sent Referral to [place] Ph. # [phone] they will take 10 days to review chart and Let us know. Address [address] [place], [place]  [address]

## 2018-05-22 NOTE — ED Triage Notes (Signed)
Pt c/o bilateral leg and foot pain onset 9 months +++   Describes pain on feet as burning and pain on legs as aching  Denies inj/trauma  Pt weighs 294 lbs   A&O x4... NAD.[name]. ambulatory

## 2018-05-22 NOTE — ED Provider Notes (Signed)
[facility]    CSN: [id number] Arrival date & time: 05/22/18  1204     History   Chief Complaint Chief Complaint  Patient presents with  . Leg Pain  . Foot Pain    HPI April [name] is a 48 y.o. female with multiple medical problems comes to the urgent care with complaints of bilateral leg pain and feet pain.  Patient has chronic pain and sees a pain specialist.  She had  refill of Percocet 10/325 to be taken every 6 hours on 05/04/2018.  Patient has been taking her medications every 4 hours.  She admits to having some tingling in the lower extremities which is chronic.   HPI  Past Medical History:  Diagnosis Date  . Acanthosis nigricans   . Anxiety   . Arthritis    "knees" (04/28/2017)  . Asthma    Followed by Dr. Annamaria Boots (pulmonology); receives every other week omalizumab injections; has frequent exacerbations  . Chronic diastolic CHF (congestive heart failure) ([place]) 01/17/2017  . COPD (chronic obstructive pulmonary disease) ([place])    PFTs in 2002, FEV1/FVC 65, no post bronchodilater test done  . Depression   . GERD (gastroesophageal reflux disease)   . Headache(784.0)    "q couple days" (04/28/2017)  . Helicobacter pylori (H. pylori) infection   . Hypertension, essential   . Insomnia   . Menorrhagia   . Morbid obesity ([place])   . OSA on CPAP    Sleep study 2008 - mild OSA, not enough events to titrate CPAP; wears CPAP now/pt on 04/28/2017  . Pneumonia X 1  . Seasonal allergies   . Shortness of breath   . Tobacco user     Patient Active Problem List   Diagnosis Date Noted  . Acute asthma exacerbation 05/16/2018  . Urinary incontinence 11/17/2017  . Nausea with vomiting 11/06/2017  . Hypomagnesemia 10/27/2017  . Dysphagia 10/27/2017  . Physical deconditioning 10/25/2017  . Acute maxillary sinusitis 10/24/2017  . Emesis, persistent 10/08/2017  . Volume depletion 10/08/2017  . Pleuritic chest pain 10/08/2017  . SOB (shortness of breath) 10/08/2017  .  Debility 09/22/2017  . HCAP (healthcare-associated pneumonia) 08/06/2017  . Chronic atrial fibrillation 07/29/2017  . AF (paroxysmal atrial fibrillation) ([place])   . COPD with acute exacerbation ([place]) 02/10/2017  . Dyspnea 02/04/2017  . Chronic diastolic CHF (congestive heart failure) ([place]) 01/17/2017  . Asthma exacerbation 01/17/2017  . Metabolic syndrome 27/74/1287  . Moderate persistent asthma with exacerbation 10/19/2016  . Normocytic anemia 10/02/2016  . Elevated hemoglobin A1c 05/05/2016  . GERD (gastroesophageal reflux disease) 08/30/2015  . Anxiety and depression 08/30/2015  . Generalized anxiety disorder 08/30/2015  . Seasonal allergic rhinitis 08/29/2013  . Leukocytosis 10/21/2012  . Tobacco abuse in remission 10/07/2012  . COPD mixed type ([place]) 05/07/2012  . Knee pain, bilateral 04/25/2011  . Primary insomnia 03/14/2011  . OSA on CPAP 12/19/2010  . Obstructive sleep apnea 12/19/2010  . Hypokalemia 08/13/2010  . Cervical back pain with evidence of disc disease 04/08/2008  . Essential hypertension 07/31/2006  . Morbid obesity with body mass index of 50.0-59.9 in adult ([place]) 06/17/2006  . Major depressive disorder, recurrent episode ([place]) 04/10/2006    Past Surgical History:  Procedure Laterality Date  . CARDIOVERSION N/A 05/30/2017   Procedure: CARDIOVERSION;  Surgeon: Sanda Klein, MD;  Location: [facility];  Service: Cardiovascular;  Laterality: N/A;  . REDUCTION MAMMAPLASTY Bilateral 09/2011  . TUBAL LIGATION  1996   bilateral    OB History  No obstetric history on file.      Home Medications    Prior to Admission medications   Medication Sig Start Date End Date Taking? Authorizing Provider  albuterol (PROVENTIL) (2.5 MG/3ML) 0.083% nebulizer solution Take 3 mLs (2.5 mg total) by nebulization every 6 (six) hours as needed for wheezing or shortness of breath. 05/13/18   Lorin Glass, PA-C  apixaban (ELIQUIS) 5 MG TABS tablet Take 5 mg by mouth 2  (two) times daily. 10/02/17   [provider]  azithromycin (ZITHROMAX) 250 MG tablet Take 1 tablet (250 mg total) by mouth daily for 2 days. 05/21/18 05/23/18  Cristal Ford, DO  cefUROXime (CEFTIN) 500 MG tablet Take 1 tablet (500 mg total) by mouth 2 (two) times daily for 2 days. 05/21/18 05/23/18  Cristal Ford, DO  cetirizine (ZYRTEC) 10 MG tablet Take 10 mg by mouth daily. 10/21/17   [provider]  Ferrous Sulfate (IRON) 325 (65 Fe) MG TABS Take 1 tablet (325 mg total) by mouth every morning. Patient taking differently: Take 1 tablet by mouth 2 (two) times daily.  01/26/18   April Donovan, PA-C  fluticasone (FLONASE) 50 MCG/ACT nasal spray Place 1 spray into both nostrils daily. 10/16/17   Raiford Noble Latif, DO  furosemide (LASIX) 40 MG tablet Take 40 mg by mouth daily. 10/02/17 10/02/18  [provider]  gabapentin (NEURONTIN) 300 MG capsule Take 1 capsule (300 mg total) by mouth 2 (two) times daily. 03/18/18   [name], MD  guaiFENesin (MUCINEX) 600 MG 12 hr tablet Take 2 tablets (1,200 mg total) by mouth 2 (two) times daily. 05/20/18   Mikhail, Velta Addison, DO  ipratropium-albuterol (DUONEB) 0.5-2.5 (3) MG/3ML SOLN Inhale 3 ml two times daily and every 2 hours as needed for shortness of breath or wheezing Inhale 3 ml two times daily and every 2 hours as needed for shortness of breath or wheezing 05/13/18   Lorin Glass, PA-C  Melatonin 3 MG TABS Take 1 tablet by mouth at bedtime. 10/20/17   [provider]  metoprolol tartrate (LOPRESSOR) 50 MG tablet Take 1 tablet (50 mg total) by mouth 2 (two) times daily. 05/20/18   Mikhail, Velta Addison, DO  montelukast (SINGULAIR) 10 MG tablet Take 10 mg by mouth at bedtime. 10/02/17   [provider]  nicotine (NICODERM CQ - DOSED IN MG/24 HOURS) 21 mg/24hr patch Place 1 patch (21 mg total) onto the skin daily. 05/21/18   Mikhail, Velta Addison, DO  omeprazole (PRILOSEC) 20 MG capsule TAKE 1 CAPSULE BY MOUTH EVERY  DAY Patient taking differently: Take 20 mg by mouth daily.  02/20/18   Deneise Lever, MD  oxyCODONE-acetaminophen (PERCOCET) 10-325 MG tablet Take 1 tablet by mouth every 4 (four) hours as needed for pain.    [provider]  polyethylene glycol (MIRALAX / GLYCOLAX) packet Take 17 g by mouth daily as needed for mild constipation. 05/20/18   Mikhail, Velta Addison, DO  potassium chloride SA (K-DUR,KLOR-CON) 20 MEQ tablet Take 1 tablet (20 mEq total) by mouth daily. 03/18/18   [name], MD  predniSONE (DELTASONE) 20 MG tablet Take in the morning. Take 3 tabs x 3 days, then 2 tabs x 3 days, then 1 tab x 3 days. 05/20/18   Mikhail, Velta Addison, DO  PROAIR HFA 108 [phone] Base) MCG/ACT inhaler Inhale 2 puffs into the lungs every 4 (four) hours as needed for wheezing.  03/08/18   [provider]  zolpidem (AMBIEN) 5 MG tablet TAKE 1 TABLET(5  MG) BY MOUTH AT BEDTIME 03/16/18   [name], MD    Family History Family History  Problem Relation Age of Onset  . Hypertension Mother   . Asthma Daughter   . Cancer Paternal Aunt   . Asthma Maternal Grandmother     Social History Social History   Tobacco Use  . Smoking status: Former Smoker    Packs/day: 0.50    Years: 26.00    Pack years: 13.00    Types: Cigarettes    Last attempt to quit: 09/12/2014    Years since quitting: 3.6  . Smokeless tobacco: Never Used  Substance Use Topics  . Alcohol use: No  . Drug use: No     Allergies   Contrast media [iodinated diagnostic agents] and Oxycodone-acetaminophen   Review of Systems Review of Systems  Constitutional: Negative for activity change, appetite change and fatigue.  Musculoskeletal: Positive for arthralgias, back pain and myalgias.     Physical Exam Triage Vital Signs ED Triage Vitals  Enc Vitals Group     BP 05/22/18 1255 (!) 154/78     Pulse Rate 05/22/18 1255 88     Resp 05/22/18 1255 16     Temp 05/22/18 1255 98.1 F (36.7 C)     Temp Source 05/22/18  1255 Oral     SpO2 05/22/18 1255 100 %     Weight --      Height --      Head Circumference --      Peak Flow --      Pain Score 05/22/18 1256 10     Pain Loc --      Pain Edu? --      Excl. in [place]? --    No data found.  Updated Vital Signs BP (!) 154/78 (BP Location: Right Arm)   Pulse 88   Temp 98.1 F (36.7 C) (Oral)   Resp 16   LMP 04/21/2018   SpO2 100%   Visual Acuity Right Eye Distance:   Left Eye Distance:   Bilateral Distance:    Right Eye Near:   Left Eye Near:    Bilateral Near:     Physical Exam   UC Treatments / Results  Labs (all labs ordered are listed, but only abnormal results are displayed) Labs Reviewed - No data to display  EKG None  Radiology Dg Chest 2 View  Result Date: 05/21/2018 CLINICAL DATA:  Short of breath EXAM: CHEST - 2 VIEW COMPARISON:  05/18/2018, 05/17/2018, CT 10/08/2016 FINDINGS: The heart size and mediastinal contours are within normal limits. Both lungs are clear. The visualized skeletal structures are unremarkable. Bronchitic changes. Mild apical fibrosis. IMPRESSION: No active cardiopulmonary disease.  Mild apical fibrosis. Electronically Signed   By: Donavan Foil M.D.   On: 05/21/2018 18:11    Procedures Procedures (including critical care time)  Medications Ordered in UC Medications - No data to display  Initial Impression / Assessment and Plan / UC Course  I have reviewed the triage vital signs and the nursing notes.  Pertinent labs & imaging results that were available during my care of the patient were reviewed by me and considered in my medical decision making (see chart for details).     1.  Chronic leg and feet pain Narcotic medication seeking behavior Patient is advised to follow-up with pain specialist.  I evaluated the PMP and ask the patient about the prescriptions.  I explained to her that she will be breaking her pain contract if  she continues to get narcotic agents from different providers.  She  understood the need to stick with her medication as prescribed and seek refills from the same provider.  She is agreeable to following up with the pain specialist for review of her pain medication regimen.  She left the urgent care unit with no incident.  Final Clinical Impressions(s) / UC Diagnoses   Final diagnoses:  Pain in both lower extremities   Discharge Instructions   None    ED Prescriptions    None     Controlled Substance Prescriptions [place] Controlled Substance Registry consulted? No   Chase Picket, MD 05/22/18 1435

## 2018-05-25 ENCOUNTER — Telehealth: Payer: Self-pay

## 2018-05-25 ENCOUNTER — Ambulatory Visit: Payer: BLUE CROSS/BLUE SHIELD | Attending: Family Medicine | Admitting: Family Medicine

## 2018-05-25 ENCOUNTER — Encounter: Payer: Self-pay | Admitting: Family Medicine

## 2018-05-25 VITALS — BP 146/81 | HR 68 | Temp 98.2°F | Ht 66.0 in | Wt 295.8 lb

## 2018-05-25 DIAGNOSIS — Z6841 Body Mass Index (BMI) 40.0 and over, adult: Secondary | ICD-10-CM

## 2018-05-25 DIAGNOSIS — G629 Polyneuropathy, unspecified: Secondary | ICD-10-CM

## 2018-05-25 DIAGNOSIS — J4541 Moderate persistent asthma with (acute) exacerbation: Secondary | ICD-10-CM

## 2018-05-25 DIAGNOSIS — I482 Chronic atrial fibrillation, unspecified: Secondary | ICD-10-CM

## 2018-05-25 DIAGNOSIS — R531 Weakness: Secondary | ICD-10-CM | POA: Diagnosis not present

## 2018-05-25 DIAGNOSIS — I5032 Chronic diastolic (congestive) heart failure: Secondary | ICD-10-CM | POA: Diagnosis not present

## 2018-05-25 DIAGNOSIS — M25561 Pain in right knee: Secondary | ICD-10-CM

## 2018-05-25 DIAGNOSIS — M25562 Pain in left knee: Secondary | ICD-10-CM

## 2018-05-25 DIAGNOSIS — G8929 Other chronic pain: Secondary | ICD-10-CM

## 2018-05-25 DIAGNOSIS — E876 Hypokalemia: Secondary | ICD-10-CM

## 2018-05-25 MED ORDER — GABAPENTIN 300 MG PO CAPS: 300.0000 mg | ORAL_CAPSULE | Freq: Two times a day (BID) | ORAL | 3 refills | Status: DC

## 2018-05-25 NOTE — Progress Notes (Signed)
Subjective:  Patient ID: April [name], female    DOB: August 02, 1970  Age: 48 y.o. MRN: [id number]  CC: Hypertension and Congestive Heart Failure   HPI April [name] is a 48 year old female with a history of morbid obesity, major depressive disorder, hypertension, COPD/ Asthma , obstructive sleep apnea (on CPAP) with multiple hospitalizations for asthma exacerbation/COPD , history of ARDS, influenza and health associated pneumonia requiring intubation, tracheostomy and ECMO at [facility] who presents for follow-up visit today.  She complains of pain in both knees and legs which she states has been better lately.  Currently seeing pain management but was informed they would no longer accept her insurance.  Prior to this visit she had called with this complaint and a new referral to pain clinic have been placed. She complains of feeling weak more when she stands but denies symptoms of dizziness, dyspnea or wheezing.  Ambulates with the aid of a cane. Also complains of burning of her feet and should be on gabapentin but she has been out of this. Previously diagnosed with steroid-induced diabetes mellitus but her A1c is 5.4 and she is not on medications at this time.  With regards to her asthma she had a hospitalization for asthma exacerbation for which she was hospitalized from 05/15/2018 through 05/20/2018 at [facility].  Chest x-ray suggested mild pneumonia.She was treated with IV Solu-Medrol, azithromycin, ceftriaxone, Pulmicort, Brovana and flutter valve and subsequently discharged on a prednisone taper. She informs me she does not smoke and quit a while ago but when she is hospitalized she does have cravings and so requests a nicotine patch. She is yet to see her pulmonologist for follow-up.  Past Medical History:  Diagnosis Date  . Acanthosis nigricans   . Anxiety   . Arthritis    "knees" (04/28/2017)  . Asthma    Followed by Dr. Annamaria Boots (pulmonology); receives every  other week omalizumab injections; has frequent exacerbations  . Chronic diastolic CHF (congestive heart failure) ([place]) 01/17/2017  . COPD (chronic obstructive pulmonary disease) ([place])    PFTs in 2002, FEV1/FVC 65, no post bronchodilater test done  . Depression   . GERD (gastroesophageal reflux disease)   . Headache(784.0)    "q couple days" (04/28/2017)  . Helicobacter pylori (H. pylori) infection   . Hypertension, essential   . Insomnia   . Menorrhagia   . Morbid obesity ([place])   . OSA on CPAP    Sleep study 2008 - mild OSA, not enough events to titrate CPAP; wears CPAP now/pt on 04/28/2017  . Pneumonia X 1  . Seasonal allergies   . Shortness of breath   . Tobacco user     Past Surgical History:  Procedure Laterality Date  . CARDIOVERSION N/A 05/30/2017   Procedure: CARDIOVERSION;  Surgeon: Sanda Klein, MD;  Location: [facility];  Service: Cardiovascular;  Laterality: N/A;  . REDUCTION MAMMAPLASTY Bilateral 09/2011  . TUBAL LIGATION  1996   bilateral    Allergies  Allergen Reactions  . Contrast Media [Iodinated Diagnostic Agents] Itching    Ct contrast  . Oxycodone-Acetaminophen Itching     Outpatient Medications Prior to Visit  Medication Sig Dispense Refill  . albuterol (PROVENTIL) (2.5 MG/3ML) 0.083% nebulizer solution Take 3 mLs (2.5 mg total) by nebulization every 6 (six) hours as needed for wheezing or shortness of breath. 75 mL 1  . apixaban (ELIQUIS) 5 MG TABS tablet Take 5 mg by mouth 2 (two) times daily.    [name]  cetirizine (ZYRTEC) 10 MG tablet Take 10 mg by mouth daily.    . Ferrous Sulfate (IRON) 325 (65 Fe) MG TABS Take 1 tablet (325 mg total) by mouth every morning. (Patient taking differently: Take 1 tablet by mouth 2 (two) times daily. ) 30 each 0  . fluticasone (FLONASE) 50 MCG/ACT nasal spray Place 1 spray into both nostrils daily.  2  . furosemide (LASIX) 40 MG tablet Take 40 mg by mouth daily.    [name] ipratropium-albuterol (DUONEB) 0.5-2.5 (3) MG/3ML  SOLN Inhale 3 ml two times daily and every 2 hours as needed for shortness of breath or wheezing Inhale 3 ml two times daily and every 2 hours as needed for shortness of breath or wheezing 360 mL 0  . Melatonin 3 MG TABS Take 1 tablet by mouth at bedtime.    . metoprolol tartrate (LOPRESSOR) 50 MG tablet Take 1 tablet (50 mg total) by mouth 2 (two) times daily. 60 tablet 0  . montelukast (SINGULAIR) 10 MG tablet Take 10 mg by mouth at bedtime.    [name] omeprazole (PRILOSEC) 20 MG capsule TAKE 1 CAPSULE BY MOUTH EVERY DAY (Patient taking differently: Take 20 mg by mouth daily. ) 30 capsule 5  . oxyCODONE-acetaminophen (PERCOCET) 10-325 MG tablet Take 1 tablet by mouth every 4 (four) hours as needed for pain.    . potassium chloride SA (K-DUR,KLOR-CON) 20 MEQ tablet Take 1 tablet (20 mEq total) by mouth daily. 30 tablet 3  . PROAIR HFA 108 (90 Base) MCG/ACT inhaler Inhale 2 puffs into the lungs every 4 (four) hours as needed for wheezing.   0  . zolpidem (AMBIEN) 5 MG tablet TAKE 1 TABLET(5 MG) BY MOUTH AT BEDTIME 30 tablet 2  . gabapentin (NEURONTIN) 300 MG capsule Take 1 capsule (300 mg total) by mouth 2 (two) times daily. 60 capsule 3  . guaiFENesin (MUCINEX) 600 MG 12 hr tablet Take 2 tablets (1,200 mg total) by mouth 2 (two) times daily. (Patient not taking: Reported on 05/25/2018) 14 tablet 0  . nicotine (NICODERM CQ - DOSED IN MG/24 HOURS) 21 mg/24hr patch Place 1 patch (21 mg total) onto the skin daily. (Patient not taking: Reported on 05/25/2018) 28 patch 0  . polyethylene glycol (MIRALAX / GLYCOLAX) packet Take 17 g by mouth daily as needed for mild constipation. (Patient not taking: Reported on 05/25/2018) 14 each 0  . predniSONE (DELTASONE) 20 MG tablet Take in the morning. Take 3 tabs x 3 days, then 2 tabs x 3 days, then 1 tab x 3 days. (Patient not taking: Reported on 05/25/2018) 18 tablet 0   No facility-administered medications prior to visit.     ROS Review of Systems  Constitutional:  Negative for activity change, appetite change and fatigue.  HENT: Negative for congestion, sinus pressure and sore throat.   Eyes: Negative for visual disturbance.  Respiratory: Negative for cough, chest tightness, shortness of breath and wheezing.   Cardiovascular: Negative for chest pain and palpitations.  Gastrointestinal: Negative for abdominal distention, abdominal pain and constipation.  Endocrine: Negative for polydipsia.  Genitourinary: Negative for dysuria and frequency.  Musculoskeletal:       See hpi  Skin: Negative for rash.  Neurological: Negative for tremors, light-headedness and numbness.  Hematological: Does not bruise/bleed easily.  Psychiatric/Behavioral: Negative for agitation and behavioral problems.    Objective:  BP (!) 146/81   Pulse 68   Temp 98.2 F (36.8 C) (Oral)   Ht 5\' 6"  (1.676 m)  Wt 295 lb 12.8 oz (134.2 kg)   SpO2 100%   BMI 47.74 kg/m   BP/Weight 05/25/2018 05/22/2018 2/35/3614  Systolic BP 431 540 086  Diastolic BP 81 78 92  Wt. (Lbs) 295.8 - -  BMI 47.74 - -  Some encounter information is confidential and restricted. Go to Review Flowsheets activity to see all data.      Physical Exam Constitutional:      Appearance: She is well-developed. She is obese.  Cardiovascular:     Rate and Rhythm: Normal rate.     Heart sounds: Normal heart sounds. No murmur.  Pulmonary:     Effort: Pulmonary effort is normal.     Breath sounds: Normal breath sounds. No wheezing or rales.  Chest:     Chest wall: No tenderness.  Abdominal:     General: Bowel sounds are normal. There is no distension.     Palpations: Abdomen is soft. There is no mass.     Tenderness: There is no abdominal tenderness.  Musculoskeletal:     Comments: Slight TTP on ROM of knees  Neurological:     Mental Status: She is alert and oriented to person, place, and time.  Psychiatric:        Mood and Affect: Mood normal.      Assessment & Plan:   1. Hypokalemia Last  potassium was 3.1 We will repeat level today - Basic Metabolic Panel  2. Weakness Could be secondary to deconditioning - VITAMIN D 25 Hydroxy (Vit-D Deficiency, Fractures) - Ambulatory referral to [facility]  3. Chronic atrial fibrillation On anticoagulation with Eliquis Rate control with metoprolol  4. Chronic diastolic CHF (congestive heart failure) (HCC) EF 60 to 65% Euvolemic Continue meds  5. Moderate persistent asthma with acute exacerbation No active exacerbation Continue nebulizer treatments Advised to follow-up with pulmonologist  6. Chronic pain of both knees Will need to change pain clinic due to insurance issues; referral was placed prior to this visit - Ambulatory referral to [facility]  7. Neuropathy - gabapentin (NEURONTIN) 300 MG capsule; Take 1 capsule (300 mg total) by mouth 2 (two) times daily.  Dispense: 60 capsule; Refill: 3   Meds ordered this encounter  Medications  . gabapentin (NEURONTIN) 300 MG capsule    Sig: Take 1 capsule (300 mg total) by mouth 2 (two) times daily.    Dispense:  60 capsule    Refill:  3    Follow-up: Return in about 1 month (around 06/25/2018) for Follow-up on chronic medical conditions.   [name] MD

## 2018-05-25 NOTE — Patient Instructions (Signed)
Fatigue If you have fatigue, you feel tired all the time and have a lack of energy or a lack of motivation. Fatigue may make it difficult to start or complete tasks because of exhaustion. In general, occasional or mild fatigue is often a normal response to activity or life. However, long-lasting (chronic) or extreme fatigue may be a symptom of a medical condition. Follow these instructions at home: General instructions  Watch your fatigue for any changes.  Go to bed and get up at the same time every day.  Avoid fatigue by pacing yourself during the day and getting enough sleep at night.  Maintain a healthy weight. Medicines  Take over-the-counter and prescription medicines only as told by your health care provider.  Take a multivitamin, if told by your health care provider.  Do not use herbal or dietary supplements unless they are approved by your health care provider. Activity   Exercise regularly, as told by your health care provider.  Use or practice techniques to help you relax, such as yoga, tai chi, meditation, or massage therapy. Eating and drinking   Avoid heavy meals in the evening.  Eat a well-balanced diet, which includes lean proteins, whole grains, plenty of fruits and vegetables, and low-fat dairy products.  Avoid consuming too much caffeine.  Avoid the use of alcohol.  Drink enough fluid to keep your urine pale yellow. Lifestyle  Change situations that cause you stress. Try to keep your work and personal schedule in balance.  Do not use any products that contain nicotine or tobacco, such as cigarettes and e-cigarettes. If you need help quitting, ask your health care provider.  Do not use drugs. Contact a health care provider if:  Your fatigue does not get better.  You have a fever.  You suddenly lose or gain weight.  You have headaches.  You have trouble falling asleep or sleeping through the night.  You feel angry, guilty, anxious, or  sad.  You are unable to have a bowel movement (constipation).  Your skin is dry.  You have swelling in your legs or another part of your body. Get help right away if:  You feel confused.  Your vision is blurry.  You feel faint or you pass out.  You have a severe headache.  You have severe pain in your abdomen, your back, or the area between your waist and hips (pelvis).  You have chest pain, shortness of breath, or an irregular or fast heartbeat.  You are unable to urinate, or you urinate less than normal.  You have abnormal bleeding, such as bleeding from the rectum, vagina, nose, lungs, or nipples.  You vomit blood.  You have thoughts about hurting yourself or others. If you ever feel like you may hurt yourself or others, or have thoughts about taking your own life, get help right away. You can go to your nearest emergency department or call:  Your local emergency services (911 in the [place]  A suicide crisis helpline, such as the [facility] at 9164775271. This is open 24 hours a day. Summary  If you have fatigue, you feel tired all the time and have a lack of energy or a lack of motivation.  Fatigue may make it difficult to start or complete tasks because of exhaustion.  Long-lasting (chronic) or extreme fatigue may be a symptom of a medical condition.  Exercise regularly, as told by your health care provider.  Change situations that cause you stress. Try to keep your  work and personal schedule in balance. This information is not intended to replace advice given to you by your health care provider. Make sure you discuss any questions you have with your health care provider. Document Released: 02/10/2007 Document Revised: 01/08/2017 Document Reviewed: 01/08/2017 Elsevier Interactive Patient Education  2019 [employer]  

## 2018-05-25 NOTE — Telephone Encounter (Addendum)
Met with the patient when she was in the clinic today for her appointment.  Informed her that a referral for pain management has been sent to [facility] and it is under review. She noted that going to [facility] may be difficult as she does not have reliable transportation.   Provided her with medication boxes as requested.   Discussed making a referral to the community paramedicine program and she was in agreement. Referral faxed to [facility]

## 2018-05-26 ENCOUNTER — Encounter: Payer: Self-pay | Admitting: Family Medicine

## 2018-05-26 ENCOUNTER — Other Ambulatory Visit: Payer: Self-pay | Admitting: Family Medicine

## 2018-05-26 DIAGNOSIS — E559 Vitamin D deficiency, unspecified: Secondary | ICD-10-CM | POA: Insufficient documentation

## 2018-05-26 LAB — VITAMIN D 25 HYDROXY (VIT D DEFICIENCY, FRACTURES): Vit D, 25-Hydroxy: 7 ng/mL — ABNORMAL LOW (ref 30.0–100.0)

## 2018-05-26 LAB — BASIC METABOLIC PANEL
BUN / CREAT RATIO: 14 (ref 9–23)
BUN: 10 mg/dL (ref 6–24)
CO2: 26 mmol/L (ref 20–29)
Calcium: 8.4 mg/dL — ABNORMAL LOW (ref 8.7–10.2)
Chloride: 102 mmol/L (ref 96–106)
Creatinine, Ser: 0.74 mg/dL (ref 0.57–1.00)
GFR calc non Af Amer: 98 mL/min/{1.73_m2} (ref >59)
GFR, EST AFRICAN AMERICAN: 113 mL/min/{1.73_m2} (ref >59)
Glucose: 80 mg/dL (ref 65–99)
Potassium: 4.2 mmol/L (ref 3.5–5.2)
SODIUM: 142 mmol/L (ref 134–144)

## 2018-05-26 MED ORDER — ERGOCALCIFEROL 1.25 MG (50000 UT) PO CAPS: 50000.0000 [IU] | ORAL_CAPSULE | ORAL | 1 refills | Status: DC

## 2018-05-27 ENCOUNTER — Telehealth: Payer: Self-pay

## 2018-05-27 NOTE — Telephone Encounter (Signed)
Referral received for home health services. Call placed to patient to inquire if she has a preference for home health agencies. She said that she had no preference.   Call placed to [facility], spoke to [place] who stated that they are accepting referrals and accept [employer]. He requested that the referral be faxed to them for review. Referral faxed as requested.

## 2018-05-28 ENCOUNTER — Telehealth: Payer: Self-pay | Admitting: Family Medicine

## 2018-05-28 NOTE — Telephone Encounter (Signed)
April [name] called stating that she received a referral for the patient but will not be able to process the referral due to them not taking BCBS at this time due to staffing.   [facility] is taking the following insurance at this time:   [facility] Traditional Medicare

## 2018-05-28 NOTE — Telephone Encounter (Signed)
Calvin from [facility] called to Inform that they are out of network within patients insurance, please follow up to a different  location

## 2018-05-28 NOTE — Telephone Encounter (Signed)
Additional attempts made to identify an agency that can provide home health PT for the patient.   Call placed to [facility] # [phone], spoke to Jinny Blossom show stated that they do not have staffing to accept the referral. She also noted that commercial BCBS plans usually have a large co-pay for home health and the patients refuse services.  Call placed to [facility] # [phone] spoke to [facility] who stated that they are not able to accept the referral.  Call placed to [facility] [phone] spoke to [facility] in referrals who stated that they are not able to accept due to staffing.   Call placed to [facility] # [phone] , spoke to [place] who stated that they do not accept BCBS.  Call placed to [facility] # [phone], spoke to [place].  She requested that the referral be faxed to them for review. Fax # [phone] - referral faxed as requested.

## 2018-05-29 ENCOUNTER — Telehealth: Payer: Self-pay

## 2018-05-29 DIAGNOSIS — R531 Weakness: Secondary | ICD-10-CM

## 2018-05-29 NOTE — Telephone Encounter (Signed)
6 home health agencies have declined the referral either due to staffing or the agency being out of network with the patient's insurance.  Do you want to refer to outpatient PT?

## 2018-06-01 ENCOUNTER — Telehealth: Payer: Self-pay

## 2018-06-01 ENCOUNTER — Telehealth (HOSPITAL_COMMUNITY): Payer: Self-pay

## 2018-06-01 NOTE — Telephone Encounter (Signed)
Voicemail left for [facility] for scheduling initial home visit. No call back from patient.

## 2018-06-01 NOTE — Telephone Encounter (Signed)
-----   Message from [name], MD sent at 05/26/2018  3:46 PM EST ----- Vitamin D level is low and this could explain her fatigue.  I have sent a prescription for replacement to her pharmacy

## 2018-06-01 NOTE — Telephone Encounter (Signed)
Patient was called and informed of lab results. 

## 2018-06-02 ENCOUNTER — Inpatient Hospital Stay: Payer: Self-pay | Admitting: Family Medicine

## 2018-06-02 NOTE — Telephone Encounter (Signed)
Please find out if she is agreeable as her weakness could have a component of deconditioning from recent hospitalization.

## 2018-06-02 NOTE — Telephone Encounter (Signed)
Call placed to patient and explained to her that multiple home health agencies have not been able to accept the referral for home PT. She said that outpatient PT would be fine and she would have transportation to get to her appointments.

## 2018-06-03 NOTE — Telephone Encounter (Signed)
Referral has been placed. 

## 2018-06-08 ENCOUNTER — Telehealth (HOSPITAL_COMMUNITY): Payer: Self-pay

## 2018-06-08 NOTE — Telephone Encounter (Signed)
Called no answer. Left voice mail with Jiyah Torpey to call me back reference initial visit.

## 2018-06-11 IMAGING — DX DG CHEST 2V
2 series · 2 of 2 positions shown · non-contrast
Comparison: Chest x-ray dated 04/28/2017.

CLINICAL DATA: Reports asthma flair for a couple of days worse
today. No relief using inhaler at home. Also reports non productive
cough.

EXAM:
CHEST  2 VIEW

[chest pa]
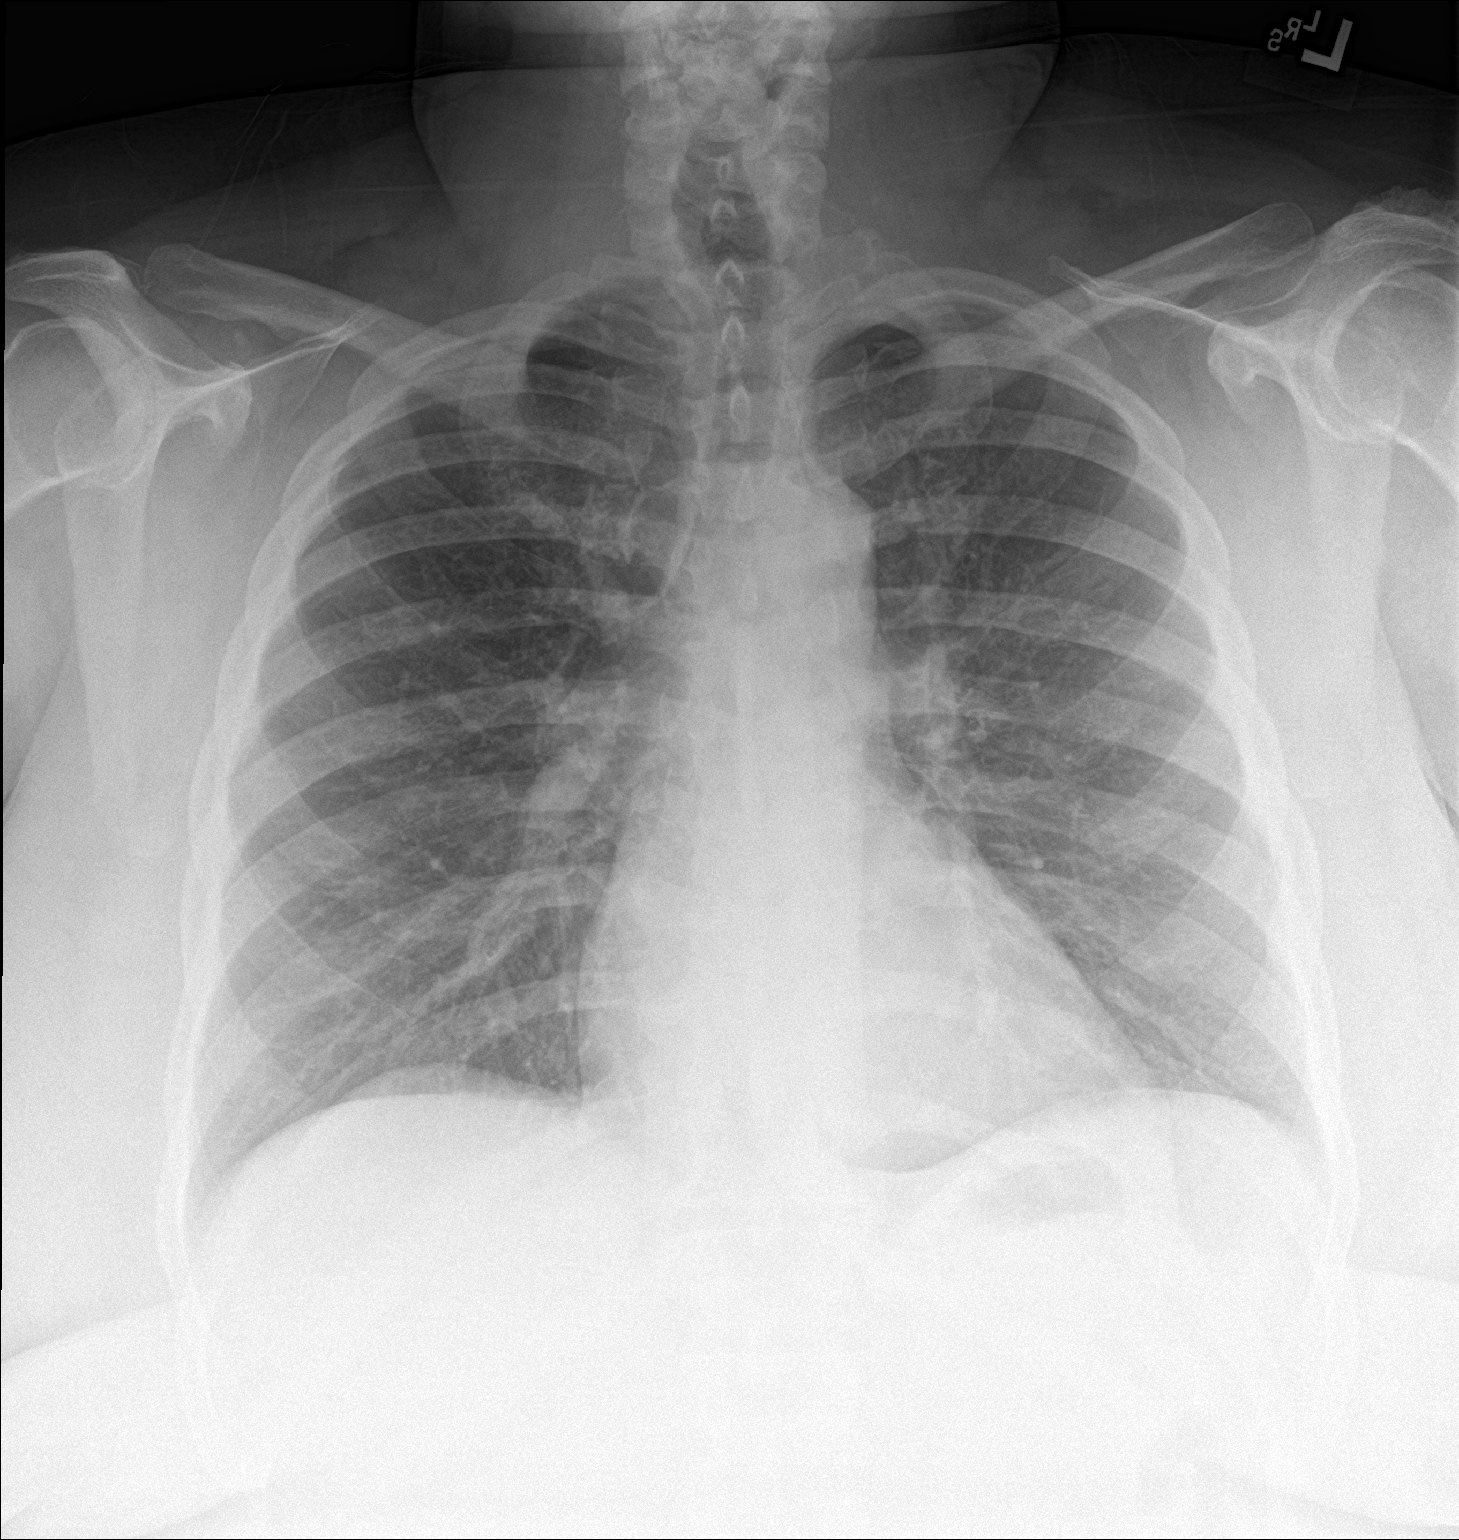

[chest lat]
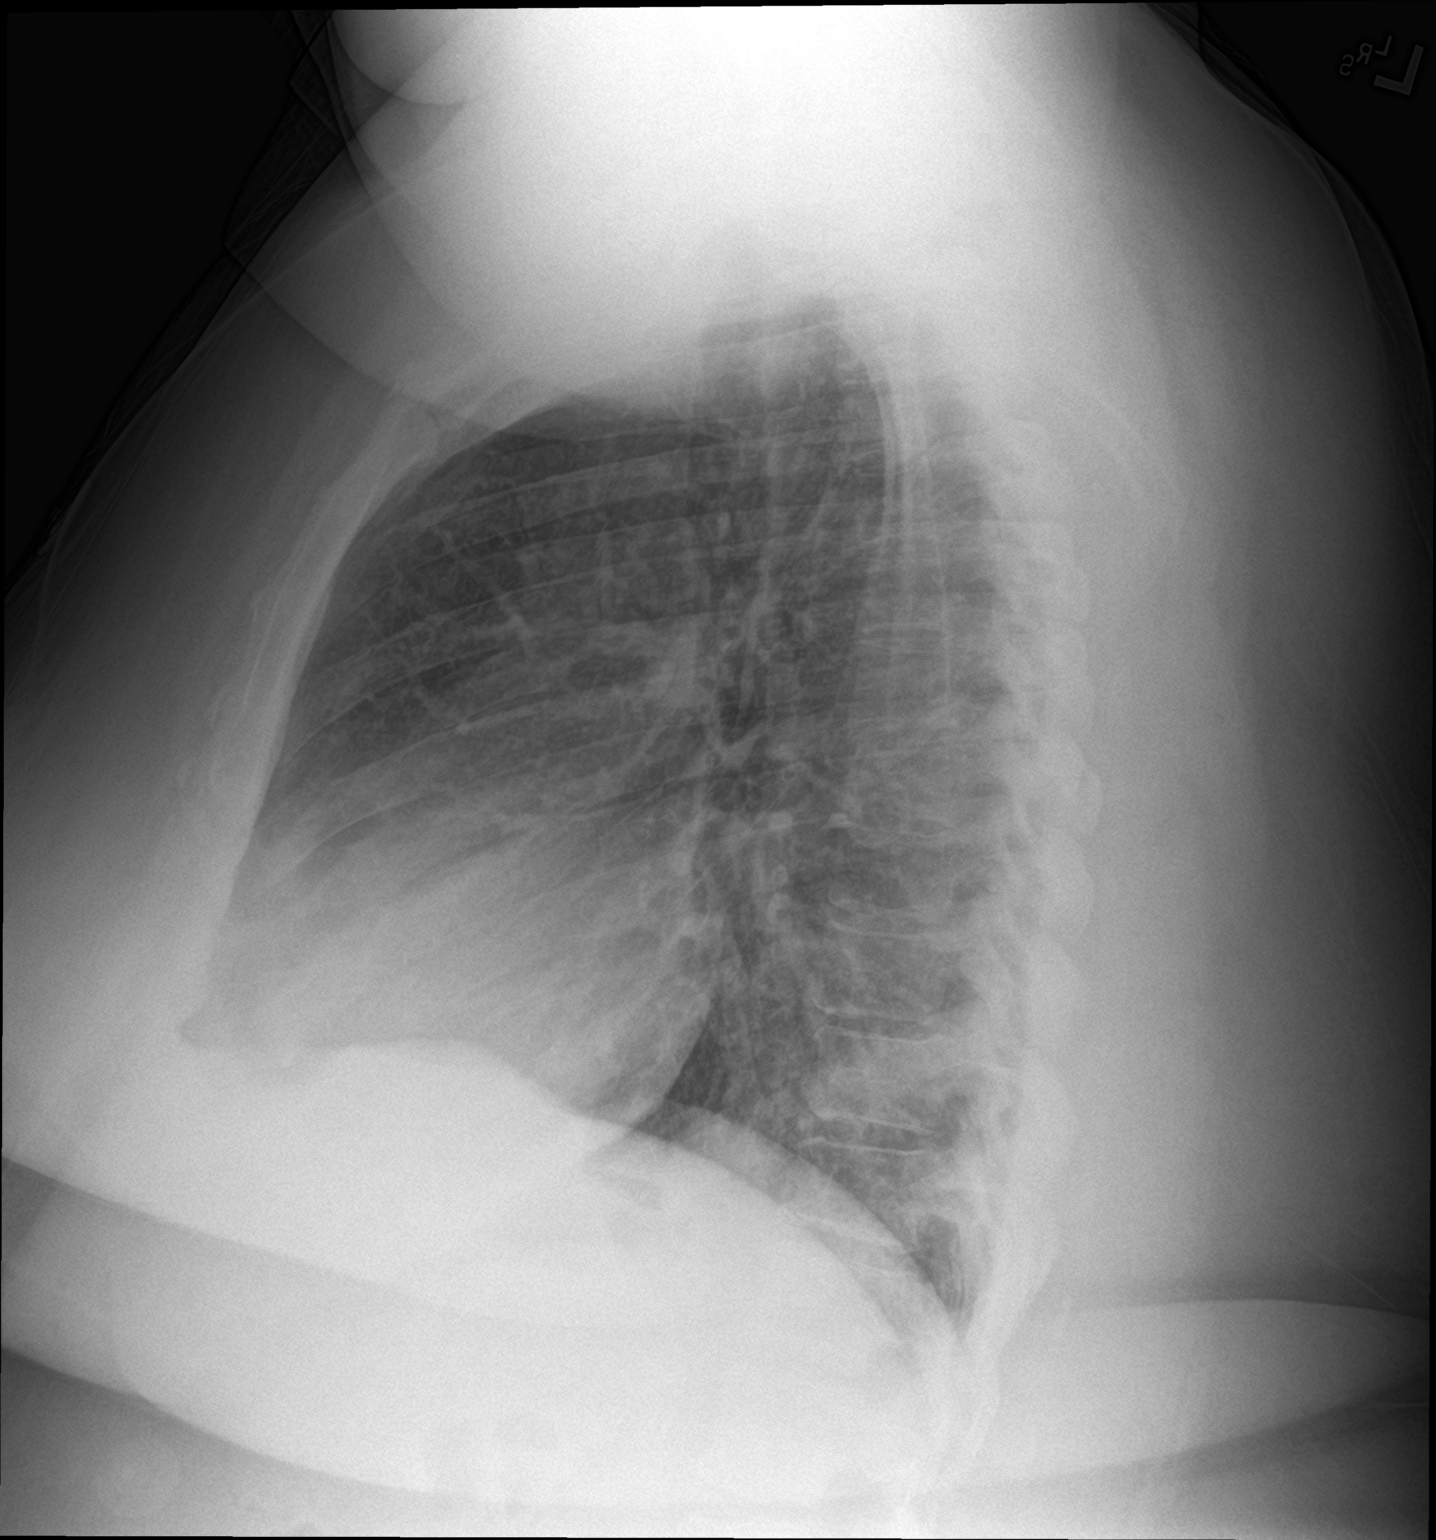

[2 of 2 positions shown; findings below may reference images not displayed]

FINDINGS: Heart size and mediastinal contours are within normal limits. Lungs
are clear. No pleural effusion or pneumothorax seen. Osseous
structures about the chest are unremarkable.
IMPRESSION: No active cardiopulmonary disease. No evidence of pneumonia or
pulmonary edema.

## 2018-06-21 IMAGING — DX DG CHEST 2V
2 series · 2 of 2 positions shown · non-contrast
Comparison: May 26, 2017

CLINICAL DATA: Generalized chest pain and shortness of breath.

EXAM:
CHEST  2 VIEW

[chest ap]
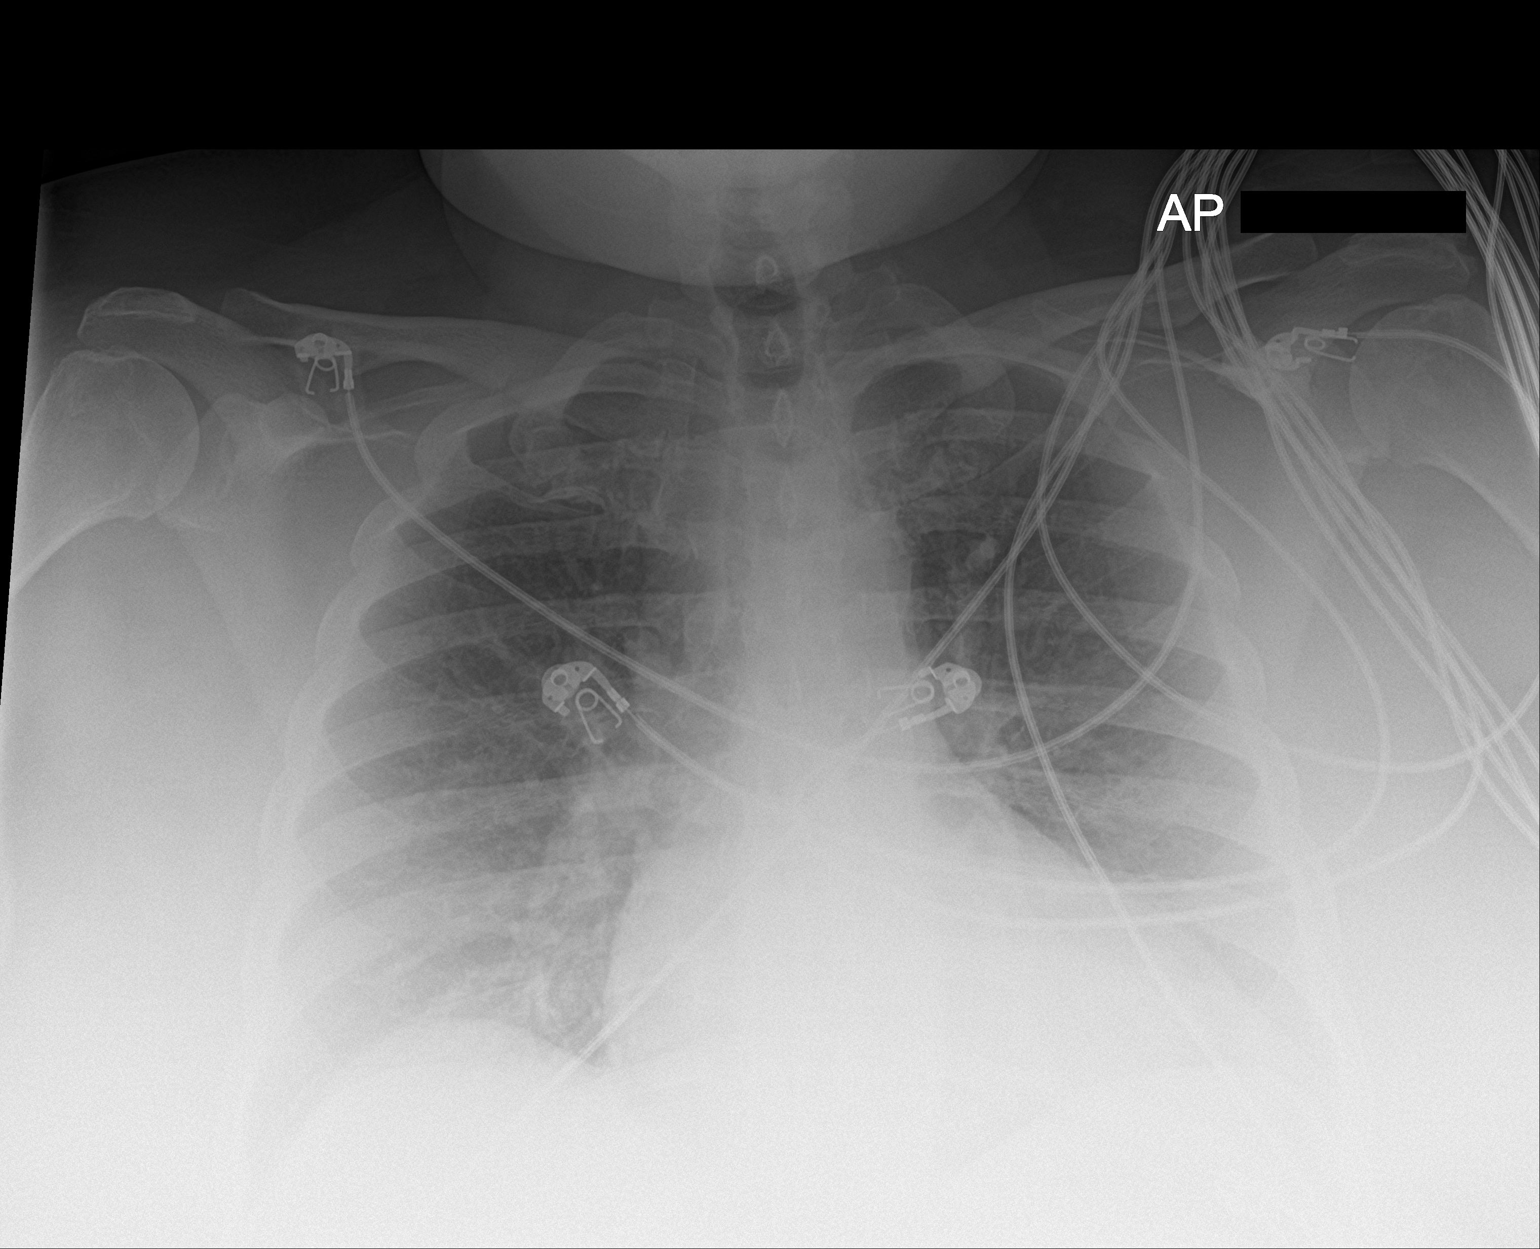

[chest lat]
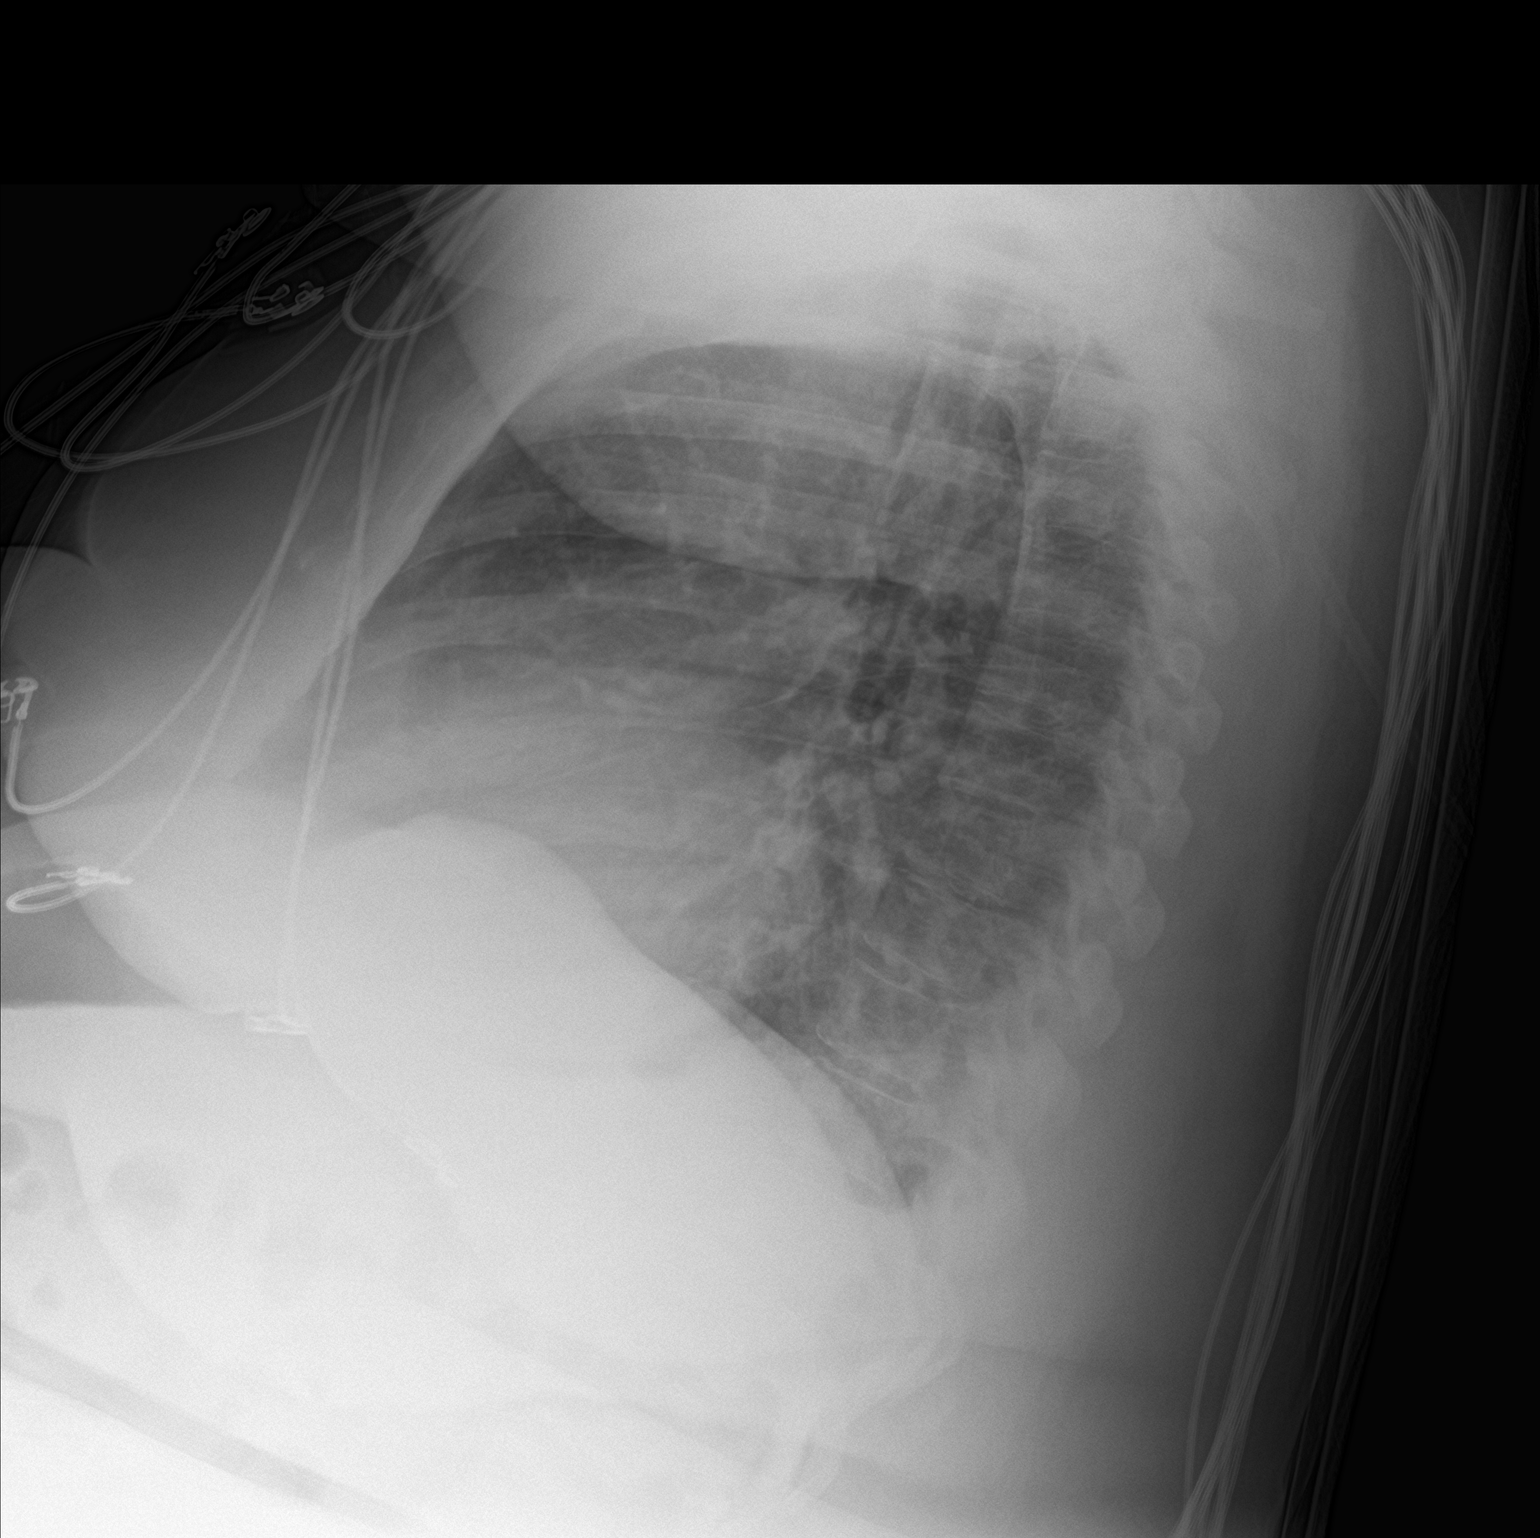

[2 of 2 positions shown; findings below may reference images not displayed]

FINDINGS: The heart size and mediastinal contours are within normal limits.
Both lungs are clear. The visualized skeletal structures are
unremarkable.
IMPRESSION: No active cardiopulmonary disease.

## 2018-06-22 ENCOUNTER — Other Ambulatory Visit (HOSPITAL_COMMUNITY): Payer: Self-pay

## 2018-06-22 ENCOUNTER — Encounter: Payer: Self-pay | Admitting: Family Medicine

## 2018-06-22 ENCOUNTER — Other Ambulatory Visit: Payer: Self-pay | Admitting: Family Medicine

## 2018-06-22 ENCOUNTER — Ambulatory Visit: Payer: BLUE CROSS/BLUE SHIELD | Attending: Family Medicine | Admitting: Family Medicine

## 2018-06-22 ENCOUNTER — Telehealth: Payer: Self-pay

## 2018-06-22 VITALS — BP 186/95 | HR 73 | Temp 98.0°F | Ht 66.0 in | Wt 308.8 lb

## 2018-06-22 DIAGNOSIS — G8929 Other chronic pain: Secondary | ICD-10-CM

## 2018-06-22 DIAGNOSIS — J4541 Moderate persistent asthma with (acute) exacerbation: Secondary | ICD-10-CM | POA: Diagnosis not present

## 2018-06-22 DIAGNOSIS — I10 Essential (primary) hypertension: Secondary | ICD-10-CM | POA: Diagnosis not present

## 2018-06-22 DIAGNOSIS — F5101 Primary insomnia: Secondary | ICD-10-CM | POA: Diagnosis not present

## 2018-06-22 DIAGNOSIS — G629 Polyneuropathy, unspecified: Secondary | ICD-10-CM

## 2018-06-22 DIAGNOSIS — M25562 Pain in left knee: Secondary | ICD-10-CM

## 2018-06-22 DIAGNOSIS — M25561 Pain in right knee: Secondary | ICD-10-CM

## 2018-06-22 DIAGNOSIS — E119 Type 2 diabetes mellitus without complications: Secondary | ICD-10-CM

## 2018-06-22 MED ORDER — DULOXETINE HCL 60 MG PO CPEP: 60.0000 mg | ORAL_CAPSULE | Freq: Every day | ORAL | 3 refills | Status: DC

## 2018-06-22 MED ORDER — GABAPENTIN 300 MG PO CAPS: 600.0000 mg | ORAL_CAPSULE | Freq: Two times a day (BID) | ORAL | 3 refills | Status: DC

## 2018-06-22 NOTE — Progress Notes (Signed)
Patient is having pain in legs and feet. 

## 2018-06-22 NOTE — Telephone Encounter (Signed)
Met with the patient when she was in the clinic today for her appointment.  Introduced her to Jeris Penta, EMT and explained the community paramedicine program. The patient plans to meet with Nira Conn next week.

## 2018-06-22 NOTE — Progress Notes (Signed)
Subjective:  Patient ID: April [name], female    DOB: January 31, 1971  Age: 48 y.o. MRN: [id number]  CC: Hypertension   HPI April [name] is a 48 year old female with a history of morbid obesity, major depressive disorder, hypertension, COPD/ Asthma , obstructive sleep apnea (on CPAP) with multiple hospitalizations for asthma exacerbation/COPD , steroid-induced diabetes (previously on metformin) history of ARDS, influenza and health associated pneumonia requiring intubation, tracheostomy and ECMO at [facility] who presents for follow-up visit today. She finds that she has had to use prednisone every other day to daily for the last couple of days (of note she was previously on 10 mg of prednisone daily by Dr. Annamaria Boots prior to her hospitalization at [facility]).  She has needed this due to increasing shortness of breath and cough which she denies at this time.  She has been unable to obtain a follow-up appointment with her pulmonologist.  She complains of burning in the soles of both feet which is worse at night especially after she has been on her feet for prolonged periods.  Her bed sheets touching her feet also make her pain worse.  Symptoms are uncontrolled on current dose of gabapentin. She has chronic knee pain and was referred to pain management with an upcoming appointment next month. Also complains of insomnia which is uncontrolled on current dose of Ambien.  She informs me of a period of blurry vision which occurrs at night while driving to the extent that she had to pull over.  This has been on for the last 1 week.  She has not had an eye exam. Past Medical History:  Diagnosis Date  . Acanthosis nigricans   . Anxiety   . Arthritis    "knees" (04/28/2017)  . Asthma    Followed by Dr. Annamaria Boots (pulmonology); receives every other week omalizumab injections; has frequent exacerbations  . Chronic diastolic CHF (congestive heart failure) ([place]) 01/17/2017  . COPD (chronic obstructive  pulmonary disease) ([place])    PFTs in 2002, FEV1/FVC 65, no post bronchodilater test done  . Depression   . GERD (gastroesophageal reflux disease)   . Headache(784.0)    "q couple days" (04/28/2017)  . Helicobacter pylori (H. pylori) infection   . Hypertension, essential   . Insomnia   . Menorrhagia   . Morbid obesity ([place])   . OSA on CPAP    Sleep study 2008 - mild OSA, not enough events to titrate CPAP; wears CPAP now/pt on 04/28/2017  . Pneumonia X 1  . Seasonal allergies   . Shortness of breath   . Tobacco user     Past Surgical History:  Procedure Laterality Date  . CARDIOVERSION N/A 05/30/2017   Procedure: CARDIOVERSION;  Surgeon: Sanda Klein, MD;  Location: [facility];  Service: Cardiovascular;  Laterality: N/A;  . REDUCTION MAMMAPLASTY Bilateral 09/2011  . TUBAL LIGATION  1996   bilateral    Family History  Problem Relation Age of Onset  . Hypertension Mother   . Asthma Daughter   . Cancer Paternal Aunt   . Asthma Maternal Grandmother     Allergies  Allergen Reactions  . Contrast Media [Iodinated Diagnostic Agents] Itching    Ct contrast  . Oxycodone-Acetaminophen Itching    Outpatient Medications Prior to Visit  Medication Sig Dispense Refill  . albuterol (PROVENTIL) (2.5 MG/3ML) 0.083% nebulizer solution Take 3 mLs (2.5 mg total) by nebulization every 6 (six) hours as needed for wheezing or shortness of breath. 75 mL 1  .  apixaban (ELIQUIS) 5 MG TABS tablet Take 5 mg by mouth 2 (two) times daily.    . cetirizine (ZYRTEC) 10 MG tablet Take 10 mg by mouth daily.    . ergocalciferol (DRISDOL) 1.25 MG (50000 UT) capsule Take 1 capsule (50,000 Units total) by mouth once a week. 9 capsule 1  . Ferrous Sulfate (IRON) 325 (65 Fe) MG TABS Take 1 tablet (325 mg total) by mouth every morning. (Patient taking differently: Take 1 tablet by mouth 2 (two) times daily. ) 30 each 0  . fluticasone (FLONASE) 50 MCG/ACT nasal spray Place 1 spray into both nostrils daily.  2    . furosemide (LASIX) 40 MG tablet Take 40 mg by mouth daily.    [name] guaiFENesin (MUCINEX) 600 MG 12 hr tablet Take 2 tablets (1,200 mg total) by mouth 2 (two) times daily. 14 tablet 0  . ipratropium-albuterol (DUONEB) 0.5-2.5 (3) MG/3ML SOLN Inhale 3 ml two times daily and every 2 hours as needed for shortness of breath or wheezing Inhale 3 ml two times daily and every 2 hours as needed for shortness of breath or wheezing 360 mL 0  . Melatonin 3 MG TABS Take 1 tablet by mouth at bedtime.    . metoprolol tartrate (LOPRESSOR) 50 MG tablet Take 1 tablet (50 mg total) by mouth 2 (two) times daily. 60 tablet 0  . montelukast (SINGULAIR) 10 MG tablet Take 10 mg by mouth at bedtime.    . nicotine (NICODERM CQ - DOSED IN MG/24 HOURS) 21 mg/24hr patch Place 1 patch (21 mg total) onto the skin daily. 28 patch 0  . omeprazole (PRILOSEC) 20 MG capsule TAKE 1 CAPSULE BY MOUTH EVERY DAY (Patient taking differently: Take 20 mg by mouth daily. ) 30 capsule 5  . oxyCODONE-acetaminophen (PERCOCET) 10-325 MG tablet Take 1 tablet by mouth every 4 (four) hours as needed for pain.    . polyethylene glycol (MIRALAX / GLYCOLAX) packet Take 17 g by mouth daily as needed for mild constipation. 14 each 0  . potassium chloride SA (K-DUR,KLOR-CON) 20 MEQ tablet Take 1 tablet (20 mEq total) by mouth daily. 30 tablet 3  . predniSONE (DELTASONE) 20 MG tablet Take in the morning. Take 3 tabs x 3 days, then 2 tabs x 3 days, then 1 tab x 3 days. 18 tablet 0  . PROAIR HFA 108 (90 Base) MCG/ACT inhaler Inhale 2 puffs into the lungs every 4 (four) hours as needed for wheezing.   0  . zolpidem (AMBIEN) 5 MG tablet TAKE 1 TABLET(5 MG) BY MOUTH AT BEDTIME 30 tablet 2  . gabapentin (NEURONTIN) 300 MG capsule Take 1 capsule (300 mg total) by mouth 2 (two) times daily. 60 capsule 3   No facility-administered medications prior to visit.      ROS Review of Systems  Constitutional: Negative for activity change, appetite change and fatigue.   HENT: Negative for congestion, sinus pressure and sore throat.   Eyes: Negative for visual disturbance.  Respiratory: Positive for cough. Negative for chest tightness, shortness of breath and wheezing.   Cardiovascular: Negative for chest pain and palpitations.  Gastrointestinal: Negative for abdominal distention, abdominal pain and constipation.  Endocrine: Negative for polydipsia.  Genitourinary: Negative for dysuria and frequency.  Musculoskeletal:       See hpi  Skin: Negative for rash.  Neurological: Positive for numbness. Negative for tremors and light-headedness.  Hematological: Does not bruise/bleed easily.  Psychiatric/Behavioral: Negative for agitation and behavioral problems.    Objective:  BP (!) 186/95   Pulse 73   Temp 98 F (36.7 C) (Oral)   Ht 5\' 6"  (1.676 m)   Wt (!) 308 lb 12.8 oz (140.1 kg)   SpO2 100%   BMI 49.84 kg/m   BP/Weight 06/22/2018 05/25/2018 4/94/4967  Systolic BP 591 638 466  Diastolic BP 95 81 78  Wt. (Lbs) 308.8 295.8 -  BMI 49.84 47.74 -  Some encounter information is confidential and restricted. Go to Review Flowsheets activity to see all data.      Physical Exam Constitutional:      Appearance: She is well-developed. She is obese.  Cardiovascular:     Rate and Rhythm: Normal rate.     Heart sounds: Normal heart sounds. No murmur.  Pulmonary:     Effort: Pulmonary effort is normal.     Breath sounds: Normal breath sounds. No wheezing or rales.  Chest:     Chest wall: No tenderness.  Abdominal:     General: Bowel sounds are normal. There is no distension.     Palpations: Abdomen is soft. There is no mass.     Tenderness: There is no abdominal tenderness.  Musculoskeletal: Normal range of motion.        General: Tenderness (on palpation and ROM of both knees) present.  Neurological:     Mental Status: She is alert and oriented to person, place, and time.     CMP Latest Ref Rng & Units 05/25/2018 05/20/2018 05/20/2018  Glucose 65  - 99 mg/dL 80 - 148(H)  BUN 6 - 24 mg/dL 10 - 20  Creatinine 0.57 - 1.00 mg/dL 0.74 - 0.99  Sodium 134 - 144 mmol/L 142 - 138  Potassium 3.5 - 5.2 mmol/L 4.2 3.1(L) 3.0(L)  Chloride 96 - 106 mmol/L 102 - 106  CO2 20 - 29 mmol/L 26 - 22  Calcium 8.7 - 10.2 mg/dL 8.4(L) - 7.6(L)  Total Protein 6.5 - 8.1 g/dL - - -  Total Bilirubin 0.3 - 1.2 mg/dL - - -  Alkaline Phos 38 - 126 U/L - - -  AST 15 - 41 U/L - - -  ALT 0 - 44 U/L - - -    Lipid Panel     Component Value Date/Time   CHOL 183 10/06/2009 2113   TRIG 411 (H) 08/14/2017 2000   HDL 47 10/06/2009 2113   CHOLHDL 3.9 Ratio 10/06/2009 2113   VLDL 13 10/06/2009 2113   LDLCALC 123 (H) 10/06/2009 2113    CBC    Component Value Date/Time   WBC 17.5 (H) 05/20/2018 0701   RBC 4.62 05/20/2018 0701   HGB 11.6 (L) 05/20/2018 0701   HGB 10.4 (L) 01/22/2018 1403   HCT 37.8 05/20/2018 0701   HCT 33.3 (L) 01/22/2018 1403   PLT 531 (H) 05/20/2018 0701   PLT 616 (H) 01/22/2018 1403   MCV 81.8 05/20/2018 0701   MCV 82 01/22/2018 1403   MCH 25.1 (L) 05/20/2018 0701   MCHC 30.7 05/20/2018 0701   RDW 18.7 (H) 05/20/2018 0701   RDW 17.1 (H) 01/22/2018 1403   LYMPHSABS 1.8 05/18/2018 0307   LYMPHSABS 3.3 (H) 01/22/2018 1403   MONOABS 0.9 05/18/2018 0307   EOSABS 0.0 05/18/2018 0307   EOSABS 0.3 01/22/2018 1403   BASOSABS 0.0 05/18/2018 0307   BASOSABS 0.1 01/22/2018 1403    Lab Results  Component Value Date   HGBA1C 5.4 05/17/2018    Assessment & Plan:   1. Neuropathy Uncontrolled Increase gabapentin dose  Cymbalta added to regimen She will be seeing pain management early next month - gabapentin (NEURONTIN) 300 MG capsule; Take 2 capsules (600 mg total) by mouth 2 (two) times daily.  Dispense: 120 capsule; Refill: 3  2. Chronic pain of both knees Uncontrolled on current regimen Previously followed by pain clinic and has an upcoming appointment early next month Weight loss will be beneficial  3. Primary  insomnia Uncontrolled on Ambien I have discussed with her sleep hygiene and my reluctance to increase her dose of Ambien given multiple comorbidities  4. Moderate persistent asthma with exacerbation No acute exacerbation but she finds she is needing to use prednisone more frequently (was previously on daily prednisone in the past by Dr. Annamaria Boots) We will attempt scheduling an appointment with Dr. Annamaria Boots her pulmonologist Followed by the per medicine program  5. Essential hypertension Uncontrolled She is yet to take her medications We have discussed complications of noncompliance including stroke and death and she promises to be more compliant   Meds ordered this encounter  Medications  . gabapentin (NEURONTIN) 300 MG capsule    Sig: Take 2 capsules (600 mg total) by mouth 2 (two) times daily.    Dispense:  120 capsule    Refill:  3  . DULoxetine (CYMBALTA) 60 MG capsule    Sig: Take 1 capsule (60 mg total) by mouth daily.    Dispense:  30 capsule    Refill:  3    Follow-up: Return in about 3 months (around 09/20/2018) for follow up on chronic medical conditions.       [name], MD, FAAFP. [facility] [place], [place]   06/22/2018, 4:40 PM

## 2018-06-22 NOTE — Patient Instructions (Addendum)
Acute Knee Pain, Adult Many things can cause knee pain. Sometimes, knee pain is sudden (acute) and may be caused by damage, swelling, or irritation of the muscles and tissues that support your knee. The pain often goes away on its own with time and rest. If the pain does not go away, tests may be done to find out what is causing the pain. Follow these instructions at home: Pay attention to any changes in your symptoms. Take these actions to relieve your pain. If you have a knee sleeve or brace:   Wear the sleeve or brace as told by your doctor. Remove it only as told by your doctor.  Loosen the sleeve or brace if your toes: ? Tingle. ? Become numb. ? Turn cold and blue.  Keep the sleeve or brace clean.  If the sleeve or brace is not waterproof: ? Do not let it get wet. ? Cover it with a watertight covering when you take a bath or shower. Activity  Rest your knee.  Do not do things that cause pain.  Avoid activities where both feet leave the ground at the same time (high-impact activities). Examples are running, jumping rope, and doing jumping jacks.  Work with a physical therapist to make a safe exercise program, as told by your doctor. Managing pain, stiffness, and swelling   If told, put ice on the knee: ? Put ice in a plastic bag. ? Place a towel between your skin and the bag. ? Leave the ice on for 20 minutes, 2-3 times a day.  If told, put pressure (compression) on your injured knee to control swelling, give support, and help with discomfort. Compression may be done with an elastic bandage. General instructions  Take all medicines only as told by your doctor.  Raise (elevate) your knee while you are sitting or lying down. Make sure your knee is higher than your heart.  Sleep with a pillow under your knee.  Do not use any products that contain nicotine or tobacco. These include cigarettes, e-cigarettes, and chewing tobacco. These products may slow down healing. If  you need help quitting, ask your doctor.  If you are overweight, work with your doctor and a food expert (dietitian) to set goals to lose weight. Being overweight can make your knee hurt more.  Keep all follow-up visits as told by your doctor. This is important. Contact a doctor if:  The knee pain does not stop.  The knee pain changes or gets worse.  You have a fever along with knee pain.  Your knee feels warm when you touch it.  Your knee gives out or locks up. Get help right away if:  Your knee swells, and the swelling gets worse.  You cannot move your knee.  You have very bad knee pain. Summary  Many things can cause knee pain. The pain often goes away on its own with time and rest.  Your doctor may do tests to find out the cause of the pain.  Pay attention to any changes in your symptoms. Relieve your pain with rest, medicines, light activity, and use of ice.  Get help right away if you cannot move your knee or your knee pain is very bad. This information is not intended to replace advice given to you by your health care provider. Make sure you discuss any questions you have with your health care provider. Document Released: 07/12/2008 Document Revised: 09/25/2017 Document Reviewed: 09/25/2017 Elsevier Interactive Patient Education  2019 Elsevier Inc.  

## 2018-06-22 NOTE — Progress Notes (Signed)
Spoke to [place] in the clinic today about [facility] program and what goals she wants to accomplish. Patient stated she wanted assistance filling a pill box and just continuing to feel better. Patient was advised I would be seeing her Monday's and checking in on her frequently. Patient agreed to same. Clinic took vitals and patient was seen by Dr. Margarita Rana.

## 2018-06-29 ENCOUNTER — Telehealth: Payer: Self-pay

## 2018-06-29 NOTE — Telephone Encounter (Signed)
Anushree called me to inform me she could not make today's visit andnn needed to reschedule for next Monday 3/9. Patient was advised I would call back later in the week to schedule.

## 2018-07-12 ENCOUNTER — Other Ambulatory Visit: Payer: Self-pay | Admitting: Family Medicine

## 2018-07-13 ENCOUNTER — Telehealth: Payer: Self-pay

## 2018-07-13 NOTE — Telephone Encounter (Signed)
Spoke to April [name], she advised she had some errands to run this morning but would call back later to schedule appointment for today.

## 2018-07-14 NOTE — Telephone Encounter (Signed)
Pt last seen: 06/22/18 Next appt: n/a Last RX written on: 03/16/18 Date of original fill: 03/16/18 Date of refill(s): 05/05/18, 06/08/18  The patient also received Oxycodone/APAP 10/325 from Dr, Jerilee Hoh with [facility] during this time, please refill if appropriate.

## 2018-07-14 NOTE — Telephone Encounter (Signed)
Pt called in wanting to know and update if her zolpidem (AMBIEN) 5 MG tablet is going to get refilled please follow up

## 2018-07-15 ENCOUNTER — Telehealth: Payer: Self-pay

## 2018-07-15 NOTE — Telephone Encounter (Signed)
Pharmacy is requesting a refill on Dulera 200-5 which is not on the current med list but looks like it may have been inadvertently discontinued. Please refill if appropriate.

## 2018-07-16 MED ORDER — MOMETASONE FURO-FORMOTEROL FUM 200-5 MCG/ACT IN AERO: 2.0000 | INHALATION_SPRAY | Freq: Two times a day (BID) | RESPIRATORY_TRACT | 3 refills | Status: DC

## 2018-07-23 IMAGING — DX DG CHEST 2V
2 series · 2 of 2 positions shown · non-contrast
Comparison: Chest radiograph dated 06/05/2017

CLINICAL DATA: 46-year-old female with shortness of breath and
wheezing.

EXAM:
CHEST - 2 VIEW

[chest lat]
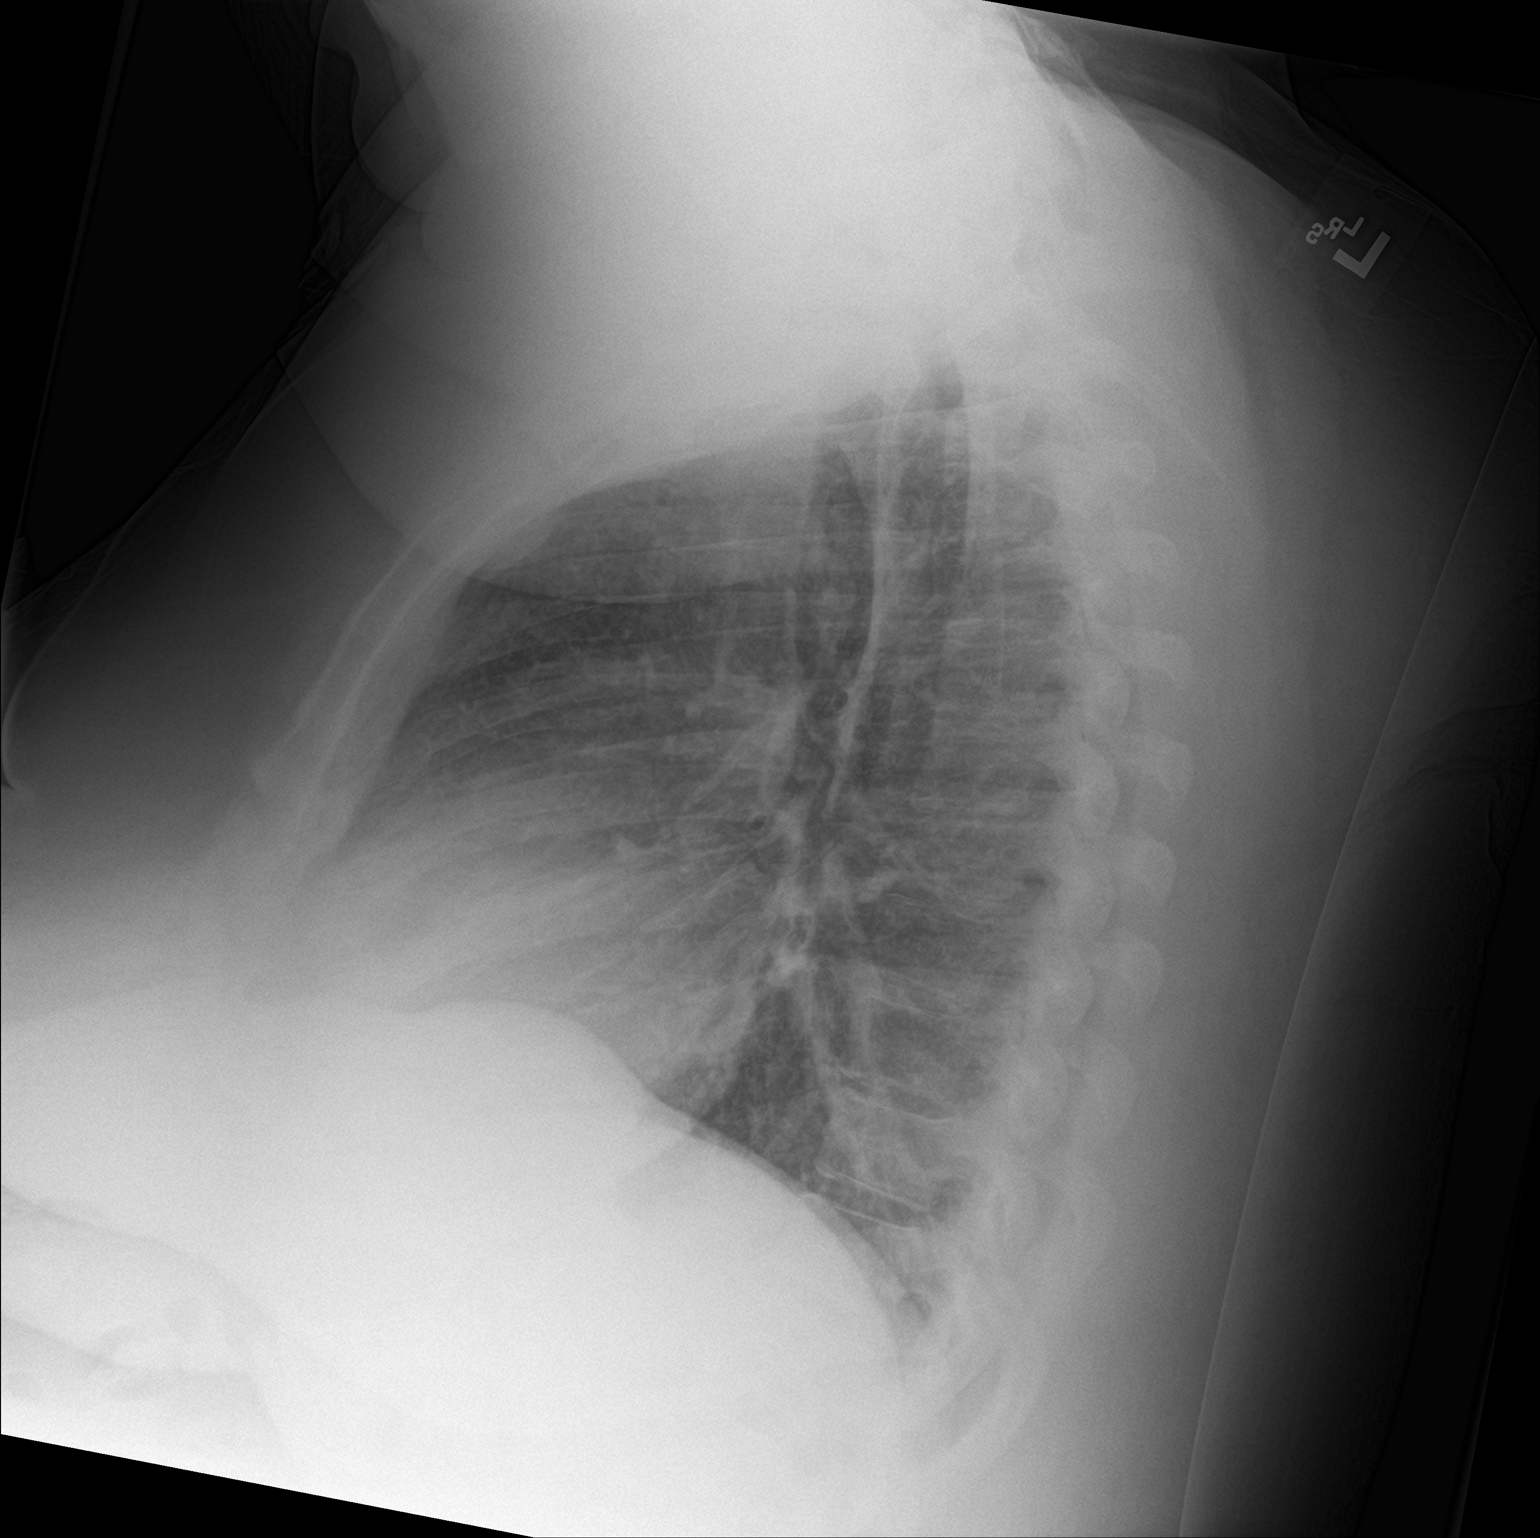

[chest ap]
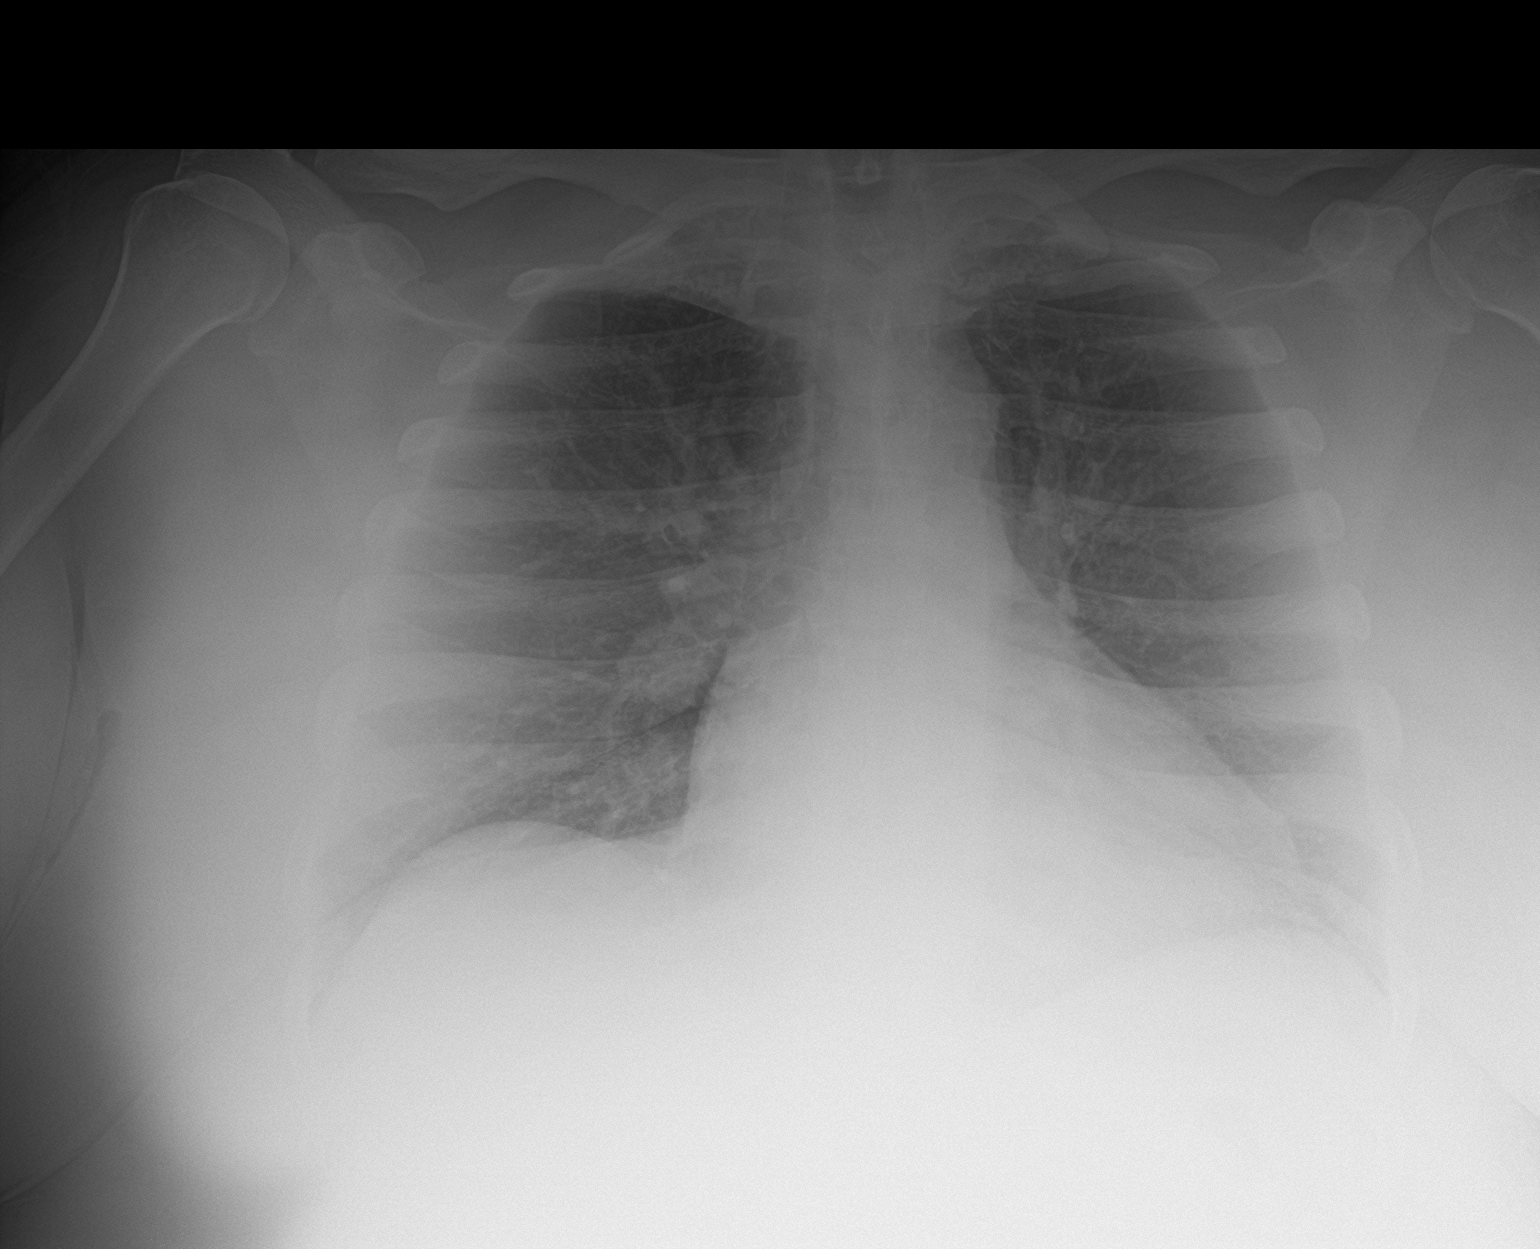

[2 of 2 positions shown; findings below may reference images not displayed]

FINDINGS: Minimal left lung base atelectatic changes. No focal consolidation,
pleural effusion, or pneumothorax. Stable cardiac silhouette. No
acute osseous pathology.
IMPRESSION: No active cardiopulmonary disease.

## 2018-08-02 ENCOUNTER — Other Ambulatory Visit: Payer: Self-pay

## 2018-08-02 ENCOUNTER — Encounter (HOSPITAL_COMMUNITY): Payer: Self-pay | Admitting: Emergency Medicine

## 2018-08-02 ENCOUNTER — Emergency Department (HOSPITAL_COMMUNITY): Payer: BLUE CROSS/BLUE SHIELD

## 2018-08-02 ENCOUNTER — Inpatient Hospital Stay (HOSPITAL_COMMUNITY)
Admission: EM | Admit: 2018-08-02 | Discharge: 2018-08-04 | DRG: 191 | Disposition: A | Discharge status: Home / Self Care | Payer: BLUE CROSS/BLUE SHIELD | Attending: Internal Medicine | Admitting: Internal Medicine

## 2018-08-02 DIAGNOSIS — Z9989 Dependence on other enabling machines and devices: Secondary | ICD-10-CM

## 2018-08-02 DIAGNOSIS — J441 Chronic obstructive pulmonary disease with (acute) exacerbation: Secondary | ICD-10-CM | POA: Diagnosis not present

## 2018-08-02 DIAGNOSIS — Z9119 Patient's noncompliance with other medical treatment and regimen: Secondary | ICD-10-CM

## 2018-08-02 DIAGNOSIS — Z7951 Long term (current) use of inhaled steroids: Secondary | ICD-10-CM

## 2018-08-02 DIAGNOSIS — J4541 Moderate persistent asthma with (acute) exacerbation: Secondary | ICD-10-CM

## 2018-08-02 DIAGNOSIS — F419 Anxiety disorder, unspecified: Secondary | ICD-10-CM | POA: Diagnosis present

## 2018-08-02 DIAGNOSIS — K219 Gastro-esophageal reflux disease without esophagitis: Secondary | ICD-10-CM | POA: Diagnosis present

## 2018-08-02 DIAGNOSIS — Z87891 Personal history of nicotine dependence: Secondary | ICD-10-CM

## 2018-08-02 DIAGNOSIS — Z720 Tobacco use: Secondary | ICD-10-CM | POA: Diagnosis present

## 2018-08-02 DIAGNOSIS — J45901 Unspecified asthma with (acute) exacerbation: Secondary | ICD-10-CM | POA: Diagnosis present

## 2018-08-02 DIAGNOSIS — E785 Hyperlipidemia, unspecified: Secondary | ICD-10-CM | POA: Diagnosis present

## 2018-08-02 DIAGNOSIS — G4733 Obstructive sleep apnea (adult) (pediatric): Secondary | ICD-10-CM | POA: Diagnosis present

## 2018-08-02 DIAGNOSIS — Z825 Family history of asthma and other chronic lower respiratory diseases: Secondary | ICD-10-CM

## 2018-08-02 DIAGNOSIS — Z6841 Body Mass Index (BMI) 40.0 and over, adult: Secondary | ICD-10-CM

## 2018-08-02 DIAGNOSIS — Z79899 Other long term (current) drug therapy: Secondary | ICD-10-CM

## 2018-08-02 DIAGNOSIS — Z8249 Family history of ischemic heart disease and other diseases of the circulatory system: Secondary | ICD-10-CM

## 2018-08-02 DIAGNOSIS — I5032 Chronic diastolic (congestive) heart failure: Secondary | ICD-10-CM | POA: Diagnosis not present

## 2018-08-02 DIAGNOSIS — F339 Major depressive disorder, recurrent, unspecified: Secondary | ICD-10-CM | POA: Diagnosis present

## 2018-08-02 DIAGNOSIS — R0603 Acute respiratory distress: Secondary | ICD-10-CM | POA: Diagnosis present

## 2018-08-02 DIAGNOSIS — Z9114 Patient's other noncompliance with medication regimen: Secondary | ICD-10-CM

## 2018-08-02 DIAGNOSIS — J4551 Severe persistent asthma with (acute) exacerbation: Secondary | ICD-10-CM | POA: Diagnosis not present

## 2018-08-02 DIAGNOSIS — Z7901 Long term (current) use of anticoagulants: Secondary | ICD-10-CM

## 2018-08-02 DIAGNOSIS — F329 Major depressive disorder, single episode, unspecified: Secondary | ICD-10-CM

## 2018-08-02 DIAGNOSIS — I110 Hypertensive heart disease with heart failure: Secondary | ICD-10-CM | POA: Diagnosis present

## 2018-08-02 DIAGNOSIS — F330 Major depressive disorder, recurrent, mild: Secondary | ICD-10-CM

## 2018-08-02 DIAGNOSIS — Z809 Family history of malignant neoplasm, unspecified: Secondary | ICD-10-CM

## 2018-08-02 DIAGNOSIS — I10 Essential (primary) hypertension: Secondary | ICD-10-CM | POA: Diagnosis present

## 2018-08-02 DIAGNOSIS — I480 Paroxysmal atrial fibrillation: Secondary | ICD-10-CM | POA: Diagnosis present

## 2018-08-02 LAB — COMPREHENSIVE METABOLIC PANEL
ALT: 7 U/L (ref 0–44)
AST: 17 U/L (ref 15–41)
Albumin: 3 g/dL — ABNORMAL LOW (ref 3.5–5.0)
Alkaline Phosphatase: 82 U/L (ref 38–126)
Anion gap: 6 (ref 5–15)
BUN: 7 mg/dL (ref 6–20)
CO2: 25 mmol/L (ref 22–32)
Calcium: 8.3 mg/dL — ABNORMAL LOW (ref 8.9–10.3)
Chloride: 101 mmol/L (ref 98–111)
Creatinine, Ser: 0.7 mg/dL (ref 0.44–1.00)
GFR calc Af Amer: >60 mL/min (ref >60)
GFR calc non Af Amer: >60 mL/min (ref >60)
Glucose, Bld: 91 mg/dL (ref 70–99)
Potassium: 4 mmol/L (ref 3.5–5.1)
Sodium: 132 mmol/L — ABNORMAL LOW (ref 135–145)
Total Bilirubin: <0.1 mg/dL — ABNORMAL LOW (ref 0.3–1.2)
Total Protein: 6.5 g/dL (ref 6.5–8.1)

## 2018-08-02 LAB — CBC WITH DIFFERENTIAL/PLATELET
Abs Immature Granulocytes: 0.08 10*3/uL — ABNORMAL HIGH (ref 0.00–0.07)
Basophils Absolute: 0.1 10*3/uL (ref 0.0–0.1)
Basophils Relative: 1 %
Eosinophils Absolute: 0.7 10*3/uL — ABNORMAL HIGH (ref 0.0–0.5)
Eosinophils Relative: 6 %
HCT: 39.7 % (ref 36.0–46.0)
Hemoglobin: 12.5 g/dL (ref 12.0–15.0)
Immature Granulocytes: 1 %
Lymphocytes Relative: 25 %
Lymphs Abs: 2.8 10*3/uL (ref 0.7–4.0)
MCH: 27.6 pg (ref 26.0–34.0)
MCHC: 31.5 g/dL (ref 30.0–36.0)
MCV: 87.6 fL (ref 80.0–100.0)
Monocytes Absolute: 0.9 10*3/uL (ref 0.1–1.0)
Monocytes Relative: 8 %
Neutro Abs: 6.5 10*3/uL (ref 1.7–7.7)
Neutrophils Relative %: 59 %
Platelets: 404 10*3/uL — ABNORMAL HIGH (ref 150–400)
RBC: 4.53 MIL/uL (ref 3.87–5.11)
RDW: 17.4 % — ABNORMAL HIGH (ref 11.5–15.5)
WBC: 10.9 10*3/uL — ABNORMAL HIGH (ref 4.0–10.5)
nRBC: 0 % (ref 0.0–0.2)

## 2018-08-02 LAB — POCT I-STAT EG7
Bicarbonate: 26.8 mmol/L (ref 20.0–28.0)
Calcium, Ion: 1.14 mmol/L — ABNORMAL LOW (ref 1.15–1.40)
HCT: 39 % (ref 36.0–46.0)
Hemoglobin: 13.3 g/dL (ref 12.0–15.0)
O2 Saturation: 72 %
Potassium: 4 mmol/L (ref 3.5–5.1)
Sodium: 136 mmol/L (ref 135–145)
TCO2: 28 mmol/L (ref 22–32)
pCO2, Ven: 49.6 mmHg (ref 44.0–60.0)
pH, Ven: 7.34 (ref 7.250–7.430)
pO2, Ven: 41 mmHg (ref 32.0–45.0)

## 2018-08-02 LAB — TROPONIN I: Troponin I: <0.03 ng/mL (ref <0.03)

## 2018-08-02 LAB — BRAIN NATRIURETIC PEPTIDE: B Natriuretic Peptide: 32 pg/mL (ref 0.0–100.0)

## 2018-08-02 MED ORDER — ALBUTEROL SULFATE (2.5 MG/3ML) 0.083% IN NEBU: 2.5000 mg | INHALATION_SOLUTION | RESPIRATORY_TRACT | Status: DC | PRN

## 2018-08-02 MED ORDER — APIXABAN 5 MG PO TABS
5.0000 mg | ORAL_TABLET | Freq: Two times a day (BID) | ORAL | Status: DC
  Administered 2018-08-03 – 2018-08-04 (×4): 5 mg via ORAL
  Filled 2018-08-02 (×4): qty 1

## 2018-08-02 MED ORDER — HYDRALAZINE HCL 20 MG/ML IJ SOLN
5.0000 mg | INTRAMUSCULAR | Status: DC | PRN
  Administered 2018-08-03: 5 mg via INTRAVENOUS
  Filled 2018-08-02: qty 1

## 2018-08-02 MED ORDER — GABAPENTIN 300 MG PO CAPS
600.0000 mg | ORAL_CAPSULE | Freq: Two times a day (BID) | ORAL | Status: DC
  Administered 2018-08-03 – 2018-08-04 (×4): 600 mg via ORAL
  Filled 2018-08-02 (×4): qty 2

## 2018-08-02 MED ORDER — AZITHROMYCIN 250 MG PO TABS
250.0000 mg | ORAL_TABLET | Freq: Every day | ORAL | Status: DC
  Administered 2018-08-04: 250 mg via ORAL
  Filled 2018-08-02: qty 1

## 2018-08-02 MED ORDER — FUROSEMIDE 40 MG PO TABS
40.0000 mg | ORAL_TABLET | Freq: Every day | ORAL | Status: DC
  Administered 2018-08-03 – 2018-08-04 (×2): 40 mg via ORAL
  Filled 2018-08-02 (×2): qty 1

## 2018-08-02 MED ORDER — AZITHROMYCIN 500 MG PO TABS
500.0000 mg | ORAL_TABLET | Freq: Every day | ORAL | Status: AC
  Administered 2018-08-03: 500 mg via ORAL
  Filled 2018-08-02: qty 1

## 2018-08-02 MED ORDER — OXYCODONE-ACETAMINOPHEN 10-325 MG PO TABS: 1.0000 | ORAL_TABLET | ORAL | Status: DC | PRN

## 2018-08-02 MED ORDER — METOPROLOL TARTRATE 50 MG PO TABS
50.0000 mg | ORAL_TABLET | Freq: Two times a day (BID) | ORAL | Status: DC
  Administered 2018-08-03 – 2018-08-04 (×3): 50 mg via ORAL
  Filled 2018-08-02 (×3): qty 1

## 2018-08-02 MED ORDER — HYDRALAZINE HCL 20 MG/ML IJ SOLN
5.0000 mg | Freq: Once | INTRAMUSCULAR | Status: DC
  Filled 2018-08-02: qty 1

## 2018-08-02 MED ORDER — MOMETASONE FURO-FORMOTEROL FUM 200-5 MCG/ACT IN AERO
2.0000 | INHALATION_SPRAY | Freq: Two times a day (BID) | RESPIRATORY_TRACT | Status: DC
  Administered 2018-08-03 – 2018-08-04 (×2): 2 via RESPIRATORY_TRACT
  Filled 2018-08-02 (×2): qty 8.8

## 2018-08-02 MED ORDER — OXYCODONE-ACETAMINOPHEN 5-325 MG PO TABS
1.0000 | ORAL_TABLET | ORAL | Status: DC | PRN
  Administered 2018-08-03 – 2018-08-04 (×4): 1 via ORAL
  Filled 2018-08-02 (×4): qty 1

## 2018-08-02 MED ORDER — NICOTINE 21 MG/24HR TD PT24
21.0000 mg | MEDICATED_PATCH | Freq: Every day | TRANSDERMAL | Status: DC
  Filled 2018-08-02 (×2): qty 1

## 2018-08-02 MED ORDER — OXYCODONE HCL 5 MG PO TABS
5.0000 mg | ORAL_TABLET | ORAL | Status: DC | PRN
  Administered 2018-08-03 – 2018-08-04 (×2): 5 mg via ORAL
  Filled 2018-08-02 (×3): qty 1

## 2018-08-02 MED ORDER — METOPROLOL TARTRATE 25 MG PO TABS
50.0000 mg | ORAL_TABLET | Freq: Once | ORAL | Status: AC
  Administered 2018-08-02: 50 mg via ORAL
  Filled 2018-08-02: qty 2

## 2018-08-02 MED ORDER — IPRATROPIUM-ALBUTEROL 0.5-2.5 (3) MG/3ML IN SOLN
3.0000 mL | RESPIRATORY_TRACT | Status: DC
  Filled 2018-08-02 (×2): qty 3

## 2018-08-02 MED ORDER — ALBUTEROL SULFATE HFA 108 (90 BASE) MCG/ACT IN AERS
7.0000 | INHALATION_SPRAY | Freq: Once | RESPIRATORY_TRACT | Status: AC
  Administered 2018-08-02: 7 via RESPIRATORY_TRACT
  Filled 2018-08-02: qty 6.7

## 2018-08-02 MED ORDER — IPRATROPIUM-ALBUTEROL 20-100 MCG/ACT IN AERS
1.0000 | INHALATION_SPRAY | RESPIRATORY_TRACT | Status: AC
  Administered 2018-08-02: 20:00:00 1 via RESPIRATORY_TRACT
  Filled 2018-08-02: qty 4

## 2018-08-02 MED ORDER — LORATADINE 10 MG PO TABS
10.0000 mg | ORAL_TABLET | Freq: Every day | ORAL | Status: DC
  Administered 2018-08-03 – 2018-08-04 (×2): 10 mg via ORAL
  Filled 2018-08-02 (×2): qty 1

## 2018-08-02 MED ORDER — MONTELUKAST SODIUM 10 MG PO TABS
10.0000 mg | ORAL_TABLET | Freq: Every day | ORAL | Status: DC
  Administered 2018-08-03: 10 mg via ORAL
  Filled 2018-08-02: qty 1

## 2018-08-02 MED ORDER — DULOXETINE HCL 60 MG PO CPEP
60.0000 mg | ORAL_CAPSULE | Freq: Every day | ORAL | Status: DC
  Administered 2018-08-03 – 2018-08-04 (×2): 60 mg via ORAL
  Filled 2018-08-02 (×2): qty 1

## 2018-08-02 MED ORDER — METHYLPREDNISOLONE SODIUM SUCC 125 MG IJ SOLR
125.0000 mg | Freq: Once | INTRAMUSCULAR | Status: AC
  Administered 2018-08-02: 125 mg via INTRAVENOUS
  Filled 2018-08-02: qty 2

## 2018-08-02 MED ORDER — METHYLPREDNISOLONE SODIUM SUCC 125 MG IJ SOLR
60.0000 mg | Freq: Three times a day (TID) | INTRAMUSCULAR | Status: DC
  Administered 2018-08-03 – 2018-08-04 (×4): 60 mg via INTRAVENOUS
  Filled 2018-08-02 (×4): qty 2

## 2018-08-02 MED ORDER — PANTOPRAZOLE SODIUM 40 MG PO TBEC
40.0000 mg | DELAYED_RELEASE_TABLET | Freq: Every day | ORAL | Status: DC
  Administered 2018-08-03 – 2018-08-04 (×2): 40 mg via ORAL
  Filled 2018-08-02 (×2): qty 1

## 2018-08-02 MED ORDER — FERROUS SULFATE 325 (65 FE) MG PO TABS
325.0000 mg | ORAL_TABLET | Freq: Two times a day (BID) | ORAL | Status: DC
  Administered 2018-08-03 – 2018-08-04 (×4): 325 mg via ORAL
  Filled 2018-08-02 (×4): qty 1

## 2018-08-02 MED ORDER — MAGNESIUM SULFATE 2 GM/50ML IV SOLN
2.0000 g | Freq: Once | INTRAVENOUS | Status: AC
  Administered 2018-08-02: 2 g via INTRAVENOUS
  Filled 2018-08-02: qty 50

## 2018-08-02 MED ORDER — ZOLPIDEM TARTRATE 5 MG PO TABS
5.0000 mg | ORAL_TABLET | Freq: Every day | ORAL | Status: DC
  Administered 2018-08-03 (×2): 5 mg via ORAL
  Filled 2018-08-02 (×2): qty 1

## 2018-08-02 MED ORDER — FLUTICASONE PROPIONATE 50 MCG/ACT NA SUSP
1.0000 | Freq: Every day | NASAL | Status: DC
  Filled 2018-08-02 (×2): qty 16

## 2018-08-02 NOTE — ED Notes (Signed)
Pt. Ambulated to restroom without help.

## 2018-08-02 NOTE — ED Notes (Signed)
Notified MD of elevated BP

## 2018-08-02 NOTE — H&P (Addendum)
History and Physical    April [name] April [name] DOB: 1970-09-16 DOA: 08/02/2018  Referring MD/NP/PA:   PCP: [name], MD   Patient coming from:  The patient is coming from home.  At baseline, pt is independent for most of ADL.        Chief Complaint: SOB  HPI: April [name] is a 48 y.o. female with medical history significant of COPD, asthma, former smoker, hypertension, hyperlipidemia, GERD, depression, anxiety, CHF, OSA on CPAP, morbid obesity, PAF on Eliquis, who presents with shortness breath.  Patient states that she has been having shortness of breath in the past 2 days, which has been progressively worsening.  She has cough with little white mucus production, no fever or chills.  No sick contact or recent long distant traveling.  Patient states that she had mild chest pain on coughing earlier, which has resolved.  Denies nausea, vomiting, diarrhea, abdominal pain, symptoms of UTI or unilateral weakness. Patient states that she has been going outside more frequently recently and has asthma exacerbations that are frequently triggered by pollen blooms. She states that she is attempted using her scheduled as well as rescue inhalers at home with no improvement in symptoms. Pt speaks full sentence.  ED Course: pt was found to have WBC 10.9, troponin negative, BNP 32, electrolytes renal function okay, temperature normal, blood pressure 200/154--> 171/62, heart rate in 90s, tachypnea, oxygen saturation 100% on room air, chest x-ray showed cardiomegaly without infiltration or edema.  Patient is placed on telemetry bed for observation.  Review of Systems:   General: no fevers, chills, no body weight gain, has fatigue HEENT: no blurry vision, hearing changes or sore throat Respiratory: has dyspnea, coughing, wheezing CV: no chest pain, no palpitations GI: no nausea, vomiting, abdominal pain, diarrhea, constipation GU: no dysuria, burning on urination, increased urinary frequency,  hematuria  Ext: has trace leg edema Neuro: no unilateral weakness, numbness, or tingling, no vision change or hearing loss Skin: no rash, no skin tear. MSK: No muscle spasm, no deformity, no limitation of range of movement in spin Heme: No easy bruising.  Travel history: No recent long distant travel.  Allergy:  Allergies  Allergen Reactions  . Contrast Media [Iodinated Diagnostic Agents] Itching    Ct contrast    Past Medical History:  Diagnosis Date  . Acanthosis nigricans   . Anxiety   . Arthritis    "knees" (04/28/2017)  . Asthma    Followed by Dr. Annamaria Boots (pulmonology); receives every other week omalizumab injections; has frequent exacerbations  . Chronic diastolic CHF (congestive heart failure) ([place]) 01/17/2017  . COPD (chronic obstructive pulmonary disease) ([place])    PFTs in 2002, FEV1/FVC 65, no post bronchodilater test done  . Depression   . GERD (gastroesophageal reflux disease)   . Headache(784.0)    "q couple days" (04/28/2017)  . Helicobacter pylori (H. pylori) infection   . Hypertension, essential   . Insomnia   . Menorrhagia   . Morbid obesity ([place])   . OSA on CPAP    Sleep study 2008 - mild OSA, not enough events to titrate CPAP; wears CPAP now/pt on 04/28/2017  . Pneumonia X 1  . Seasonal allergies   . Shortness of breath   . Tobacco user     Past Surgical History:  Procedure Laterality Date  . CARDIOVERSION N/A 05/30/2017   Procedure: CARDIOVERSION;  Surgeon: Sanda Klein, MD;  Location: [facility];  Service: Cardiovascular;  Laterality: N/A;  . REDUCTION MAMMAPLASTY Bilateral 09/2011  .  TUBAL LIGATION  1996   bilateral    Social History:  reports that she quit smoking about 3 years ago. Her smoking use included cigarettes. She has a 13.00 pack-year smoking history. She has never used smokeless tobacco. She reports that she does not drink alcohol or use drugs.  Family History:  Family History  Problem Relation Age of Onset  . Hypertension  Mother   . Asthma Daughter   . Cancer Paternal Aunt   . Asthma Maternal Grandmother      Prior to Admission medications   Medication Sig Start Date End Date Taking? Authorizing Provider  albuterol (PROVENTIL HFA;VENTOLIN HFA) 108 (90 Base) MCG/ACT inhaler INHALE 2 PUFFS INTO THE LUNGS EVERY 4 HOURS AS NEEDED FOR WHEEZING OR SHORTNESS OF BREATH Patient taking differently: Inhale 2 puffs into the lungs every 4 (four) hours as needed for wheezing or shortness of breath.  07/14/18  Yes [name], MD  albuterol (PROVENTIL) (2.5 MG/3ML) 0.083% nebulizer solution Take 3 mLs (2.5 mg total) by nebulization every 6 (six) hours as needed for wheezing or shortness of breath. 05/13/18  Yes Lorin Glass, PA-C  apixaban (ELIQUIS) 5 MG TABS tablet Take 5 mg by mouth 2 (two) times daily. 10/02/17  Yes [provider]  cetirizine (ZYRTEC) 10 MG tablet Take 10 mg by mouth daily. 10/21/17  Yes [provider]  DULoxetine (CYMBALTA) 60 MG capsule Take 1 capsule (60 mg total) by mouth daily. 06/22/18  Yes [name], MD  ergocalciferol (DRISDOL) 1.25 MG (50000 UT) capsule Take 1 capsule (50,000 Units total) by mouth once a week. Patient taking differently: Take 50,000 Units by mouth every Friday.  05/26/18  Yes [name], MD  Ferrous Sulfate (IRON) 325 (65 Fe) MG TABS Take 1 tablet (325 mg total) by mouth every morning. Patient taking differently: Take 1 tablet by mouth 2 (two) times daily.  01/26/18  Yes McClung, Angela M, PA-C  fluticasone (FLONASE) 50 MCG/ACT nasal spray Place 1 spray into both nostrils daily. 10/16/17  Yes Sheikh, Omair Latif, DO  furosemide (LASIX) 40 MG tablet Take 40 mg by mouth daily. 10/02/17 10/02/18 Yes [provider]  gabapentin (NEURONTIN) 300 MG capsule Take 2 capsules (600 mg total) by mouth 2 (two) times daily. 06/22/18  Yes [name], MD  guaiFENesin (MUCINEX) 600 MG 12 hr tablet Take 2 tablets (1,200 mg total) by mouth 2 (two) times  daily. 05/20/18  Yes Mikhail, Clinical biochemist, DO  ipratropium-albuterol (DUONEB) 0.5-2.5 (3) MG/3ML SOLN Inhale 3 ml two times daily and every 2 hours as needed for shortness of breath or wheezing Inhale 3 ml two times daily and every 2 hours as needed for shortness of breath or wheezing Patient taking differently: Inhale 3 mLs into the lungs every 2 (two) hours as needed (wheezing/shortness of breath).  05/13/18  Yes Lorin Glass, PA-C  metoprolol tartrate (LOPRESSOR) 50 MG tablet Take 1 tablet (50 mg total) by mouth 2 (two) times daily. 05/20/18  Yes Mikhail, Maryann, DO  mometasone-formoterol (DULERA) 200-5 MCG/ACT AERO Inhale 2 puffs into the lungs 2 (two) times daily. 07/16/18  Yes Newlin, Charlane Ferretti, MD  montelukast (SINGULAIR) 10 MG tablet Take 10 mg by mouth at bedtime. 10/02/17  Yes [provider]  nicotine (NICODERM CQ - DOSED IN MG/24 HOURS) 21 mg/24hr patch Place 1 patch (21 mg total) onto the skin daily. 05/21/18  Yes Mikhail, Maryann, DO  omeprazole (PRILOSEC) 20 MG capsule TAKE 1 CAPSULE BY MOUTH EVERY DAY Patient taking differently:  Take 20 mg by mouth daily.  02/20/18  Yes Young, Tarri Fuller D, MD  oxyCODONE-acetaminophen (PERCOCET) 10-325 MG tablet Take 1 tablet by mouth every 4 (four) hours as needed for pain.   Yes [provider]  potassium chloride SA (K-DUR,KLOR-CON) 20 MEQ tablet Take 1 tablet (20 mEq total) by mouth daily. 03/18/18  Yes Newlin, Charlane Ferretti, MD  zolpidem (AMBIEN) 5 MG tablet TAKE 1 TABLET(5 MG) BY MOUTH AT BEDTIME Patient taking differently: Take 5 mg by mouth at bedtime.  07/16/18  Yes [name], MD  polyethylene glycol (MIRALAX / GLYCOLAX) packet Take 17 g by mouth daily as needed for mild constipation. Patient not taking: Reported on 08/02/2018 05/20/18   Cristal Ford, DO    Physical Exam: Vitals:   08/02/18 2033 08/02/18 2045 08/02/18 2046 08/02/18 2200  BP: (!) 202/105 (!) 200/154 (!) 200/154 (!) 171/62  Pulse:  96  89  Resp: (!) 22 (!) 22   20  Temp:      TempSrc:      SpO2:  100%  99%  Weight:      Height:       General: Not in acute distress HEENT:       Eyes: PERRL, EOMI, no scleral icterus.       ENT: No discharge from the ears and nose, no pharynx injection, no tonsillar enlargement.        Neck: No JVD, no bruit, no mass felt. Heme: No neck lymph node enlargement. Cardiac: S1/S2, RRR, No murmurs, No gallops or rubs. Respiratory: Diffusely wheezing bilaterally. GI: Soft, nondistended, nontender, no rebound pain, no organomegaly, BS present. GU: No hematuria Ext: Has trace leg edema bilaterally. 2+DP/PT pulse bilaterally. Musculoskeletal: No joint deformities, No joint redness or warmth, no limitation of ROM in spin. Skin: No rashes.  Neuro: Alert, oriented X3, cranial nerves II-XII grossly intact, moves all extremities normally.  Psych: Patient is not psychotic, no suicidal or hemocidal ideation.  Labs on Admission: I have personally reviewed following labs and imaging studies  CBC: Recent Labs  Lab 08/02/18 1959 08/02/18 2011  WBC 10.9*  --   NEUTROABS 6.5  --   HGB 12.5 13.3  HCT 39.7 39.0  MCV 87.6  --   PLT 404*  --    Basic Metabolic Panel: Recent Labs  Lab 08/02/18 1959 08/02/18 2011  NA 132* 136  K 4.0 4.0  CL 101  --   CO2 25  --   GLUCOSE 91  --   BUN 7  --   CREATININE 0.70  --   CALCIUM 8.3*  --    GFR: Estimated Creatinine Clearance: 126.6 mL/min (by C-G formula based on SCr of 0.7 mg/dL). Liver Function Tests: Recent Labs  Lab 08/02/18 1959  AST 17  ALT 7  ALKPHOS 82  BILITOT <0.1*  PROT 6.5  ALBUMIN 3.0*   No results for input(s): LIPASE, AMYLASE in the last 168 hours. No results for input(s): AMMONIA in the last 168 hours. Coagulation Profile: No results for input(s): INR, PROTIME in the last 168 hours. Cardiac Enzymes: Recent Labs  Lab 08/02/18 2013  TROPONINI <0.03   BNP (last 3 results) No results for input(s): PROBNP in the last 8760 hours. HbA1C: No  results for input(s): HGBA1C in the last 72 hours. CBG: No results for input(s): GLUCAP in the last 168 hours. Lipid Profile: No results for input(s): CHOL, HDL, LDLCALC, TRIG, CHOLHDL, LDLDIRECT in the last 72 hours. Thyroid Function Tests: No results for input(s):  TSH, T4TOTAL, FREET4, T3FREE, THYROIDAB in the last 72 hours. Anemia Panel: No results for input(s): VITAMINB12, FOLATE, FERRITIN, TIBC, IRON, RETICCTPCT in the last 72 hours. Urine analysis:    Component Value Date/Time   COLORURINE YELLOW 10/13/2017 1100   APPEARANCEUR CLEAR 10/13/2017 1100   LABSPEC 1.005 10/13/2017 1100   PHURINE 5.0 10/13/2017 1100   GLUCOSEU NEGATIVE 10/13/2017 1100   GLUCOSEU NEG mg/dL 10/28/2007 2049   HGBUR SMALL (A) 10/13/2017 1100   BILIRUBINUR NEGATIVE 10/13/2017 1100   KETONESUR NEGATIVE 10/13/2017 1100   PROTEINUR NEGATIVE 10/13/2017 1100   UROBILINOGEN 1.0 11/21/2014 0707   NITRITE NEGATIVE 10/13/2017 1100   LEUKOCYTESUR SMALL (A) 10/13/2017 1100   Sepsis Labs: @LABRCNTIP (procalcitonin:4,lacticidven:4) )No results found for this or any previous visit (from the past 240 hour(s)).   Radiological Exams on Admission: Dg Chest Portable 1 View  Result Date: 08/02/2018 CLINICAL DATA:  Dyspnea EXAM: PORTABLE CHEST 1 VIEW COMPARISON:  05/21/2018 FINDINGS: Mild cardiomegaly without pulmonary edema. No focal airspace consolidation. No pleural effusion or pneumothorax. IMPRESSION: Mild cardiomegaly without pulmonary edema. Electronically Signed   By: Ulyses Jarred M.D.   On: 08/02/2018 20:20     EKG: Independently reviewed.  Sinus rhythm, QTC 483, bilateral atrial enlargement, poor R wave progression, anteroseptal infarction pattern, left axis deviation.   Assessment/Plan Principal Problem:   Asthma exacerbation Active Problems:   Major depressive disorder, recurrent episode (HCC)   Essential hypertension   OSA on CPAP   Tobacco abuse in remission   GERD (gastroesophageal reflux disease)    Chronic diastolic CHF (congestive heart failure) (HCC)   COPD with acute exacerbation (HCC)   AF (paroxysmal atrial fibrillation) (HCC)   Asthma/COPD exacerbation: Patient has productive cough, shortness of breath, wheezing on auscultation, clinically consistent with asthma/COPD exacerbation.  Chest x-ray has no infiltration or pulmonary edema.  Patient does not have fever.  No sick contacts or recent traveling.  No neutropenia, and liver function normal.  Very low suspicion for COVID19.  -will place on tele bed for obs -Inhaler: atrovent and prn albuterol; Dulera inhaler -Continue home Singulair -Solu-Medrol 60 mg IV tid -Z pak -Mucinex for cough  -Incentive spirometry -Follow up sputum culture, respiratory virus panel, Flu pcr -Nasal cannula oxygen as needed to maintain O2 saturation 93% or greater  Major depressive disorder, recurrent episode ([place]): -Cymbalta  Essential hypertension: Bp 200/154-->171/62 -Continue metoprolol -Patient is also on Lasix for CHF -IV hydralazine as needed  OSA -on CPAP   Tobacco abuse in remission: -Nicotine patch  GERD (gastroesophageal reflux disease): -Protonix  Chronic diastolic CHF (congestive heart failure) ([place]): 2D echo 05/17/2018 showed EF 60-65% with grade 2 diastolic dysfunction.  Patient has trace leg edema, no pulmonary edema chest x-ray.  BNP is 32.  Clinically compensated. -Continue home Lasix.  Atrial Fibrillation (PAF): CHA2DS2-VASc Score is 3, needs oral anticoagulation. Patient is on Eliquis at home.  Patient has not been completely compliant to taking Eliquis.  She states that she did not take it for about 1 week.  Heart rate is controlled. -Continue Eliquis and metoprolol -Educated pt about the importance of being compliant with taking Eliquis    DVT ppx: on Eliquis Code Status: Full code Family Communication: None at bed side.     Disposition Plan:  Anticipate discharge back to previous home environment Consults  called:  none Admission status: Obs / tele    Date of Service 08/02/2018    [facility]   If 7PM-7AM, please contact night-coverage [email or web address] Password  [email or web address] 08/02/2018, 11:19 PM

## 2018-08-02 NOTE — ED Triage Notes (Signed)
Pt. BIB EMS c/o worsening SOB over the past 2 days. Pt. States HX of asthma and COPD. Pt. Says "my rescue inhaler was not working." Pt. States chest tightness worsening pain with dry cough. Dyspnea upon exertion. Pt. Denies travel or being around anyone that has been sick. Currently, pt. Labored breathing at 100% on Room Air.

## 2018-08-02 NOTE — ED Provider Notes (Signed)
[facility] EMERGENCY DEPARTMENT Provider Note   CSN: [id number] Arrival date & time: 08/02/18  1904    History   Chief Complaint Chief Complaint  Patient presents with  . Shortness of Breath    HPI April [name] is a 48 y.o. female with history of COPD, asthma, CHF, and hypertension who presents to the emergency department complaining of dyspnea and wheezing for the past 2 days.  Patient states that she has been going outside more frequently recently and has asthma exacerbations that are frequently triggered by pollen blooms.  She states that she is attempted using her scheduled as well as rescue inhalers at home with no improvement in symptoms.  She denies any recent fevers, chills, or sick contacts.  She states that she has had a cough productive of white sputum which is not uncommon for her COPD and asthma exacerbations.  When asked if her current symptoms reflect recurrent emergency department visits for asthma/COPD exacerbation, she states that these mimic prior visits exactly.  She denies any nausea, vomiting, abdominal pain, dysuria/hematuria, or changes in bowel habits.      Illness  Severity:  Severe Duration:  2 days Timing:  Constant Progression:  Worsening Chronicity:  Chronic Associated symptoms: cough, shortness of breath and wheezing   Associated symptoms: no abdominal pain, no ear pain, no fever, no rash, no sore throat and no vomiting     Past Medical History:  Diagnosis Date  . Acanthosis nigricans   . Anxiety   . Arthritis    "knees" (04/28/2017)  . Asthma    Followed by Dr. Annamaria Boots (pulmonology); receives every other week omalizumab injections; has frequent exacerbations  . Chronic diastolic CHF (congestive heart failure) ([place]) 01/17/2017  . COPD (chronic obstructive pulmonary disease) ([place])    PFTs in 2002, FEV1/FVC 65, no post bronchodilater test done  . Depression   . GERD (gastroesophageal reflux disease)   . Headache(784.0)    "q  couple days" (04/28/2017)  . Helicobacter pylori (H. pylori) infection   . Hypertension, essential   . Insomnia   . Menorrhagia   . Morbid obesity ([place])   . OSA on CPAP    Sleep study 2008 - mild OSA, not enough events to titrate CPAP; wears CPAP now/pt on 04/28/2017  . Pneumonia X 1  . Seasonal allergies   . Shortness of breath   . Tobacco user     Patient Active Problem List   Diagnosis Date Noted  . Vitamin D deficiency 05/26/2018  . Acute asthma exacerbation 05/16/2018  . Urinary incontinence 11/17/2017  . Nausea with vomiting 11/06/2017  . Hypomagnesemia 10/27/2017  . Dysphagia 10/27/2017  . Physical deconditioning 10/25/2017  . Acute maxillary sinusitis 10/24/2017  . Emesis, persistent 10/08/2017  . Volume depletion 10/08/2017  . Pleuritic chest pain 10/08/2017  . SOB (shortness of breath) 10/08/2017  . Debility 09/22/2017  . HCAP (healthcare-associated pneumonia) 08/06/2017  . Chronic atrial fibrillation 07/29/2017  . AF (paroxysmal atrial fibrillation) ([place])   . COPD with acute exacerbation ([place]) 02/10/2017  . Dyspnea 02/04/2017  . Chronic diastolic CHF (congestive heart failure) ([place]) 01/17/2017  . Asthma exacerbation 01/17/2017  . Metabolic syndrome 59/56/3875  . Moderate persistent asthma with exacerbation 10/19/2016  . Normocytic anemia 10/02/2016  . Elevated hemoglobin A1c 05/05/2016  . GERD (gastroesophageal reflux disease) 08/30/2015  . Anxiety and depression 08/30/2015  . Generalized anxiety disorder 08/30/2015  . Seasonal allergic rhinitis 08/29/2013  . Leukocytosis 10/21/2012  . Tobacco abuse in remission  10/07/2012  . COPD mixed type ([place]) 05/07/2012  . Knee pain, bilateral 04/25/2011  . Primary insomnia 03/14/2011  . OSA on CPAP 12/19/2010  . Obstructive sleep apnea 12/19/2010  . Hypokalemia 08/13/2010  . Cervical back pain with evidence of disc disease 04/08/2008  . Essential hypertension 07/31/2006  . Morbid obesity with body mass index of  50.0-59.9 in adult ([place]) 06/17/2006  . Major depressive disorder, recurrent episode ([place]) 04/10/2006    Past Surgical History:  Procedure Laterality Date  . CARDIOVERSION N/A 05/30/2017   Procedure: CARDIOVERSION;  Surgeon: Sanda Klein, MD;  Location: [facility];  Service: Cardiovascular;  Laterality: N/A;  . REDUCTION MAMMAPLASTY Bilateral 09/2011  . TUBAL LIGATION  1996   bilateral     OB History   No obstetric history on file.      Home Medications    Prior to Admission medications   Medication Sig Start Date End Date Taking? Authorizing Provider  albuterol (PROVENTIL HFA;VENTOLIN HFA) 108 (90 Base) MCG/ACT inhaler INHALE 2 PUFFS INTO THE LUNGS EVERY 4 HOURS AS NEEDED FOR WHEEZING OR SHORTNESS OF BREATH Patient taking differently: Inhale 2 puffs into the lungs every 4 (four) hours as needed for wheezing or shortness of breath.  07/14/18  Yes [name], MD  albuterol (PROVENTIL) (2.5 MG/3ML) 0.083% nebulizer solution Take 3 mLs (2.5 mg total) by nebulization every 6 (six) hours as needed for wheezing or shortness of breath. 05/13/18  Yes Lorin Glass, PA-C  apixaban (ELIQUIS) 5 MG TABS tablet Take 5 mg by mouth 2 (two) times daily. 10/02/17  Yes [provider]  cetirizine (ZYRTEC) 10 MG tablet Take 10 mg by mouth daily. 10/21/17  Yes [provider]  DULoxetine (CYMBALTA) 60 MG capsule Take 1 capsule (60 mg total) by mouth daily. 06/22/18  Yes [name], MD  ergocalciferol (DRISDOL) 1.25 MG (50000 UT) capsule Take 1 capsule (50,000 Units total) by mouth once a week. Patient taking differently: Take 50,000 Units by mouth every Friday.  05/26/18  Yes [name], MD  Ferrous Sulfate (IRON) 325 (65 Fe) MG TABS Take 1 tablet (325 mg total) by mouth every morning. Patient taking differently: Take 1 tablet by mouth 2 (two) times daily.  01/26/18  Yes McClung, Angela M, PA-C  fluticasone (FLONASE) 50 MCG/ACT nasal spray Place 1 spray into both  nostrils daily. 10/16/17  Yes Sheikh, Omair Latif, DO  furosemide (LASIX) 40 MG tablet Take 40 mg by mouth daily. 10/02/17 10/02/18 Yes [provider]  gabapentin (NEURONTIN) 300 MG capsule Take 2 capsules (600 mg total) by mouth 2 (two) times daily. 06/22/18  Yes [name], MD  guaiFENesin (MUCINEX) 600 MG 12 hr tablet Take 2 tablets (1,200 mg total) by mouth 2 (two) times daily. 05/20/18  Yes Mikhail, Clinical biochemist, DO  ipratropium-albuterol (DUONEB) 0.5-2.5 (3) MG/3ML SOLN Inhale 3 ml two times daily and every 2 hours as needed for shortness of breath or wheezing Inhale 3 ml two times daily and every 2 hours as needed for shortness of breath or wheezing Patient taking differently: Inhale 3 mLs into the lungs every 2 (two) hours as needed (wheezing/shortness of breath).  05/13/18  Yes Lorin Glass, PA-C  metoprolol tartrate (LOPRESSOR) 50 MG tablet Take 1 tablet (50 mg total) by mouth 2 (two) times daily. 05/20/18  Yes Mikhail, Maryann, DO  mometasone-formoterol (DULERA) 200-5 MCG/ACT AERO Inhale 2 puffs into the lungs 2 (two) times daily. 07/16/18  Yes Newlin, Charlane Ferretti, MD  montelukast (SINGULAIR) 10 MG tablet  Take 10 mg by mouth at bedtime. 10/02/17  Yes [provider]  nicotine (NICODERM CQ - DOSED IN MG/24 HOURS) 21 mg/24hr patch Place 1 patch (21 mg total) onto the skin daily. 05/21/18  Yes Mikhail, Clinical biochemist, DO  omeprazole (PRILOSEC) 20 MG capsule TAKE 1 CAPSULE BY MOUTH EVERY DAY Patient taking differently: Take 20 mg by mouth daily.  02/20/18  Yes Young, Tarri Fuller D, MD  oxyCODONE-acetaminophen (PERCOCET) 10-325 MG tablet Take 1 tablet by mouth every 4 (four) hours as needed for pain.   Yes [provider]  potassium chloride SA (K-DUR,KLOR-CON) 20 MEQ tablet Take 1 tablet (20 mEq total) by mouth daily. 03/18/18  Yes Newlin, Charlane Ferretti, MD  zolpidem (AMBIEN) 5 MG tablet TAKE 1 TABLET(5 MG) BY MOUTH AT BEDTIME Patient taking differently: Take 5 mg by mouth at bedtime.   07/16/18  Yes [name], MD  polyethylene glycol (MIRALAX / GLYCOLAX) packet Take 17 g by mouth daily as needed for mild constipation. Patient not taking: Reported on 08/02/2018 05/20/18   Cristal Ford, DO    Family History Family History  Problem Relation Age of Onset  . Hypertension Mother   . Asthma Daughter   . Cancer Paternal Aunt   . Asthma Maternal Grandmother     Social History Social History   Tobacco Use  . Smoking status: Former Smoker    Packs/day: 0.50    Years: 26.00    Pack years: 13.00    Types: Cigarettes    Last attempt to quit: 09/12/2014    Years since quitting: 3.8  . Smokeless tobacco: Never Used  Substance Use Topics  . Alcohol use: No  . Drug use: No     Allergies   Contrast media [iodinated diagnostic agents]   Review of Systems Review of Systems  Constitutional: Negative for chills and fever.  HENT: Negative for ear pain and sore throat.   Eyes: Negative for pain and visual disturbance.  Respiratory: Positive for cough, shortness of breath and wheezing.   Cardiovascular: Negative for palpitations.  Gastrointestinal: Negative for abdominal pain and vomiting.  Genitourinary: Negative for dysuria and hematuria.  Musculoskeletal: Negative for arthralgias and back pain.  Skin: Negative for color change and rash.  Neurological: Negative for seizures and syncope.  All other systems reviewed and are negative.    Physical Exam Updated Vital Signs BP (!) 171/62   Pulse 89   Temp 98.3 F (36.8 C) (Oral)   Resp 20   Ht 5\' 5"  (1.651 m)   Wt (!) 140.2 kg   SpO2 99%   BMI 51.42 kg/m   Physical Exam Vitals signs and nursing note reviewed.  Constitutional:      General: She is not in acute distress.    Appearance: She is well-developed.  HENT:     Head: Normocephalic and atraumatic.  Eyes:     Conjunctiva/sclera: Conjunctivae normal.  Neck:     Musculoskeletal: Neck supple.  Cardiovascular:     Rate and Rhythm: Normal rate and  regular rhythm.     Heart sounds: No murmur.  Pulmonary:     Comments: Mild respiratory distress.  Patient is tachypneic.  She has diffuse expiratory wheezing heard throughout all lung fields.  No crackles within the lung bases. Abdominal:     Palpations: Abdomen is soft.     Tenderness: There is no abdominal tenderness.  Musculoskeletal: Normal range of motion.     Right lower leg: No edema.     Left lower leg:  No edema.  Skin:    General: Skin is warm and dry.  Neurological:     General: No focal deficit present.     Mental Status: She is alert and oriented to person, place, and time.      ED Treatments / Results  Labs (all labs ordered are listed, but only abnormal results are displayed) Labs Reviewed  CBC WITH DIFFERENTIAL/PLATELET - Abnormal; Notable for the following components:      Result Value   WBC 10.9 (*)    RDW 17.4 (*)    Platelets 404 (*)    Eosinophils Absolute 0.7 (*)    Abs Immature Granulocytes 0.08 (*)    All other components within normal limits  COMPREHENSIVE METABOLIC PANEL - Abnormal; Notable for the following components:   Sodium 132 (*)    Calcium 8.3 (*)    Albumin 3.0 (*)    Total Bilirubin <0.1 (*)    All other components within normal limits  POCT I-STAT EG7 - Abnormal; Notable for the following components:   Calcium, Ion 1.14 (*)    All other components within normal limits  RESPIRATORY PANEL BY PCR  TROPONIN I  BRAIN NATRIURETIC PEPTIDE  INFLUENZA PANEL BY PCR (TYPE A & B)  HIV ANTIBODY (ROUTINE TESTING W REFLEX)  I-STAT VENOUS BLOOD GAS, ED    EKG EKG Interpretation  Date/Time:  Sunday August 02 2018 19:18:11 EDT Ventricular Rate:  93 PR Interval:    QRS Duration: 98 QT Interval:  388 QTC Calculation: 483 R Axis:   -16 Text Interpretation:  Sinus rhythm Right atrial enlargement Borderline left axis deviation Confirmed by Julianne Rice [phone]) on 08/02/2018 7:22:50 PM   Radiology Dg Chest Portable 1 View  Result Date:  08/02/2018 CLINICAL DATA:  Dyspnea EXAM: PORTABLE CHEST 1 VIEW COMPARISON:  05/21/2018 FINDINGS: Mild cardiomegaly without pulmonary edema. No focal airspace consolidation. No pleural effusion or pneumothorax. IMPRESSION: Mild cardiomegaly without pulmonary edema. Electronically Signed   By: Ulyses Jarred M.D.   On: 08/02/2018 20:20    Procedures Procedures (including critical care time)  Medications Ordered in ED Medications  magnesium sulfate IVPB 2 g 50 mL (2 g Intravenous New Bag/Given 08/02/18 2233)  methylPREDNISolone sodium succinate (SOLU-MEDROL) 125 mg/2 mL injection 60 mg (has no administration in time range)  oxyCODONE-acetaminophen (PERCOCET) 10-325 MG per tablet 1 tablet (has no administration in time range)  furosemide (LASIX) tablet 40 mg (has no administration in time range)  metoprolol tartrate (LOPRESSOR) tablet 50 mg (has no administration in time range)  DULoxetine (CYMBALTA) DR capsule 60 mg (has no administration in time range)  nicotine (NICODERM CQ - dosed in mg/24 hours) patch 21 mg (has no administration in time range)  zolpidem (AMBIEN) tablet 5 mg (has no administration in time range)  pantoprazole (PROTONIX) EC tablet 40 mg (has no administration in time range)  apixaban (ELIQUIS) tablet 5 mg (has no administration in time range)  Iron TABS 325 mg (has no administration in time range)  gabapentin (NEURONTIN) capsule 600 mg (has no administration in time range)  loratadine (CLARITIN) tablet 10 mg (has no administration in time range)  fluticasone (FLONASE) 50 MCG/ACT nasal spray 1 spray (has no administration in time range)  mometasone-formoterol (DULERA) 200-5 MCG/ACT inhaler 2 puff (has no administration in time range)  montelukast (SINGULAIR) tablet 10 mg (has no administration in time range)  ipratropium-albuterol (DUONEB) 0.5-2.5 (3) MG/3ML nebulizer solution 3 mL (has no administration in time range)  albuterol (PROVENTIL) (2.5 MG/3ML) 0.083%  nebulizer solution  2.5 mg (has no administration in time range)  azithromycin (ZITHROMAX) tablet 500 mg (has no administration in time range)    Followed by  azithromycin (ZITHROMAX) tablet 250 mg (has no administration in time range)  hydrALAZINE (APRESOLINE) injection 5 mg (has no administration in time range)  Ipratropium-Albuterol (COMBIVENT) respimat 1 puff (1 puff Inhalation Given 08/02/18 2021)  albuterol (PROVENTIL HFA;VENTOLIN HFA) 108 (90 Base) MCG/ACT inhaler 7 puff (7 puffs Inhalation Given 08/02/18 1955)  metoprolol tartrate (LOPRESSOR) tablet 50 mg (50 mg Oral Given 08/02/18 2046)  methylPREDNISolone sodium succinate (SOLU-MEDROL) 125 mg/2 mL injection 125 mg (125 mg Intravenous Given 08/02/18 2233)     Initial Impression / Assessment and Plan / ED Course  I have reviewed the triage vital signs and the nursing notes.  Pertinent labs & imaging results that were available during my care of the patient were reviewed by me and considered in my medical decision making (see chart for details).        Patient is a 48 year old female with history of asthma/COPD presents the emergency department complaining of worsening dyspnea and wheezing for the past 2 days.  Patient is attempted her home inhaler regimen without improvement in symptoms which is what prompted her visit to the emergency department today.  She denies any recent fevers, chills, infectious symptoms, or sick contacts.  On initial evaluation the patient she was hemodynamically stable and in mild respiratory distress.  Patient was tachypneic with expiratory wheezing heard diffusely throughout all lung fields.  She is afebrile, not tachycardic, and satting 100% on room air.  Patient's clinical picture concerning for asthma/COPD exacerbation.  As current guidelines dictate during COVID-19 pandemic, nebulized treatments are to be avoided if at all possible, patient was given Combivent inhaler as well as 7 puffs of albuterol.  CBC with slight  leukocytosis of 10.9 however hemoglobin is within normal limits.  CMP with no significant metabolic or electrolyte derangements.  Venous blood gas within normal limits.  BNP is only 32 and troponin is undetectable.  Chest x-ray with mild cardiomegaly no evidence of pulmonary edema.  As I initially suspected patient symptoms are most likely due to asthma/COPD exacerbation.  Do not feel her symptoms are reflective of flash pulmonary edema or volume overload status.  While emergency department, blood pressures steadily climbed into the 478 systolic.  She states she has not taken her nighttime antihypertensives.  It was provided at this time.  Patient still symptomatic with diffuse expiratory wheezing so IV steroids and IV magnesium were given.  No change in sputum quality or quantity so antibiotics unnecessary. Given lack of significant improvement while in emergency department, patient will need admission for ongoing evaluation and care of acute asthma/COPD exacerbation. Case was discussed with Dr. Blaine Hamper who was in agreement with admission for ongoing evaluation and care of asthma exacerbation.     Final Clinical Impressions(s) / ED Diagnoses   Final diagnoses:  Severe persistent asthma with exacerbation    ED Discharge Orders    None       Tommie Raymond, MD 08/02/18 [id number]    Julianne Rice, MD 08/02/18 2314

## 2018-08-02 NOTE — ED Notes (Signed)
Nurse drawing labs. 

## 2018-08-02 NOTE — ED Notes (Signed)
RN assisted pt back to room from the bathroom. Her breathing became very labored and wheezy.  Provider notified

## 2018-08-03 DIAGNOSIS — R0603 Acute respiratory distress: Secondary | ICD-10-CM | POA: Diagnosis present

## 2018-08-03 DIAGNOSIS — J441 Chronic obstructive pulmonary disease with (acute) exacerbation: Secondary | ICD-10-CM | POA: Diagnosis present

## 2018-08-03 DIAGNOSIS — I110 Hypertensive heart disease with heart failure: Secondary | ICD-10-CM | POA: Diagnosis present

## 2018-08-03 DIAGNOSIS — Z9119 Patient's noncompliance with other medical treatment and regimen: Secondary | ICD-10-CM | POA: Diagnosis not present

## 2018-08-03 DIAGNOSIS — Z809 Family history of malignant neoplasm, unspecified: Secondary | ICD-10-CM | POA: Diagnosis not present

## 2018-08-03 DIAGNOSIS — G4733 Obstructive sleep apnea (adult) (pediatric): Secondary | ICD-10-CM | POA: Diagnosis present

## 2018-08-03 DIAGNOSIS — J4551 Severe persistent asthma with (acute) exacerbation: Secondary | ICD-10-CM | POA: Diagnosis present

## 2018-08-03 DIAGNOSIS — Z9114 Patient's other noncompliance with medication regimen: Secondary | ICD-10-CM | POA: Diagnosis not present

## 2018-08-03 DIAGNOSIS — J4541 Moderate persistent asthma with (acute) exacerbation: Secondary | ICD-10-CM | POA: Diagnosis not present

## 2018-08-03 DIAGNOSIS — Z87891 Personal history of nicotine dependence: Secondary | ICD-10-CM | POA: Diagnosis not present

## 2018-08-03 DIAGNOSIS — I5032 Chronic diastolic (congestive) heart failure: Secondary | ICD-10-CM | POA: Diagnosis present

## 2018-08-03 DIAGNOSIS — Z6841 Body Mass Index (BMI) 40.0 and over, adult: Secondary | ICD-10-CM | POA: Diagnosis not present

## 2018-08-03 DIAGNOSIS — E785 Hyperlipidemia, unspecified: Secondary | ICD-10-CM | POA: Diagnosis present

## 2018-08-03 DIAGNOSIS — Z825 Family history of asthma and other chronic lower respiratory diseases: Secondary | ICD-10-CM | POA: Diagnosis not present

## 2018-08-03 DIAGNOSIS — Z79899 Other long term (current) drug therapy: Secondary | ICD-10-CM | POA: Diagnosis not present

## 2018-08-03 DIAGNOSIS — I480 Paroxysmal atrial fibrillation: Secondary | ICD-10-CM | POA: Diagnosis present

## 2018-08-03 DIAGNOSIS — K219 Gastro-esophageal reflux disease without esophagitis: Secondary | ICD-10-CM | POA: Diagnosis present

## 2018-08-03 DIAGNOSIS — F419 Anxiety disorder, unspecified: Secondary | ICD-10-CM | POA: Diagnosis present

## 2018-08-03 DIAGNOSIS — F339 Major depressive disorder, recurrent, unspecified: Secondary | ICD-10-CM | POA: Diagnosis present

## 2018-08-03 DIAGNOSIS — Z7951 Long term (current) use of inhaled steroids: Secondary | ICD-10-CM | POA: Diagnosis not present

## 2018-08-03 DIAGNOSIS — Z8249 Family history of ischemic heart disease and other diseases of the circulatory system: Secondary | ICD-10-CM | POA: Diagnosis not present

## 2018-08-03 DIAGNOSIS — Z7901 Long term (current) use of anticoagulants: Secondary | ICD-10-CM | POA: Diagnosis not present

## 2018-08-03 LAB — RESPIRATORY PANEL BY PCR
Adenovirus: NOT DETECTED
Bordetella pertussis: NOT DETECTED
Chlamydophila pneumoniae: NOT DETECTED
Coronavirus 229E: NOT DETECTED
Coronavirus HKU1: NOT DETECTED
Coronavirus NL63: NOT DETECTED
Coronavirus OC43: DETECTED — AB
Influenza A: NOT DETECTED
Influenza B: NOT DETECTED
Metapneumovirus: NOT DETECTED
Mycoplasma pneumoniae: NOT DETECTED
Parainfluenza Virus 1: NOT DETECTED
Parainfluenza Virus 2: NOT DETECTED
Parainfluenza Virus 3: NOT DETECTED
Parainfluenza Virus 4: NOT DETECTED
Respiratory Syncytial Virus: NOT DETECTED
Rhinovirus / Enterovirus: NOT DETECTED

## 2018-08-03 LAB — MRSA PCR SCREENING: MRSA by PCR: NEGATIVE

## 2018-08-03 LAB — INFLUENZA PANEL BY PCR (TYPE A & B)
Influenza A By PCR: NEGATIVE
Influenza B By PCR: NEGATIVE

## 2018-08-03 LAB — HIV ANTIBODY (ROUTINE TESTING W REFLEX): HIV Screen 4th Generation wRfx: NONREACTIVE

## 2018-08-03 MED ORDER — ALBUTEROL SULFATE HFA 108 (90 BASE) MCG/ACT IN AERS
2.0000 | INHALATION_SPRAY | RESPIRATORY_TRACT | Status: DC
  Administered 2018-08-03: 2 via RESPIRATORY_TRACT

## 2018-08-03 MED ORDER — ALBUTEROL SULFATE HFA 108 (90 BASE) MCG/ACT IN AERS
4.0000 | INHALATION_SPRAY | RESPIRATORY_TRACT | Status: DC
  Administered 2018-08-03: 4 via RESPIRATORY_TRACT
  Filled 2018-08-03: qty 6.7

## 2018-08-03 MED ORDER — IPRATROPIUM-ALBUTEROL 0.5-2.5 (3) MG/3ML IN SOLN
3.0000 mL | Freq: Four times a day (QID) | RESPIRATORY_TRACT | Status: AC
  Administered 2018-08-03 (×3): 3 mL via RESPIRATORY_TRACT
  Filled 2018-08-03 (×3): qty 3

## 2018-08-03 MED ORDER — IPRATROPIUM BROMIDE 0.02 % IN SOLN: 0.5000 mg | RESPIRATORY_TRACT | Status: DC

## 2018-08-03 MED ORDER — IPRATROPIUM BROMIDE HFA 17 MCG/ACT IN AERS
2.0000 | INHALATION_SPRAY | RESPIRATORY_TRACT | Status: DC
  Administered 2018-08-03 (×2): 2 via RESPIRATORY_TRACT
  Filled 2018-08-03: qty 12.9

## 2018-08-03 MED ORDER — ONDANSETRON HCL 4 MG/2ML IJ SOLN: 4.0000 mg | Freq: Three times a day (TID) | INTRAMUSCULAR | Status: DC | PRN

## 2018-08-03 MED ORDER — IPRATROPIUM BROMIDE HFA 17 MCG/ACT IN AERS
2.0000 | INHALATION_SPRAY | RESPIRATORY_TRACT | Status: DC
  Filled 2018-08-03: qty 12.9

## 2018-08-03 MED ORDER — HYDRALAZINE HCL 10 MG PO TABS
10.0000 mg | ORAL_TABLET | Freq: Three times a day (TID) | ORAL | Status: DC
  Administered 2018-08-03 – 2018-08-04 (×3): 10 mg via ORAL
  Filled 2018-08-03 (×3): qty 1

## 2018-08-03 MED ORDER — ALBUTEROL SULFATE (2.5 MG/3ML) 0.083% IN NEBU: 3.0000 mL | INHALATION_SOLUTION | RESPIRATORY_TRACT | Status: DC | PRN

## 2018-08-03 MED ORDER — ALBUTEROL SULFATE HFA 108 (90 BASE) MCG/ACT IN AERS
2.0000 | INHALATION_SPRAY | RESPIRATORY_TRACT | Status: DC | PRN
  Filled 2018-08-03: qty 6.7

## 2018-08-03 MED ORDER — ACETAMINOPHEN 325 MG PO TABS: 650.0000 mg | ORAL_TABLET | Freq: Four times a day (QID) | ORAL | Status: DC | PRN

## 2018-08-03 NOTE — Progress Notes (Signed)
Patient refused use of cpap tonight. Will continue to monitor pt.

## 2018-08-03 NOTE — TOC Initial Note (Signed)
Transition of Care [facility]) - Initial/Assessment Note    Patient Details  Name: April [name] MRN: [id number] Date of Birth: 1970-05-18  Transition of Care [facility]) CM/SW Contact:    Royston Bake, RN Phone Number: 08/03/2018, 10:42 AM  Clinical Narrative:                 CM talked to patient about DCP; patient lives at home; goes to the [facility] for primary care and gets her medication from [facility] on [address]; pt states no problem getting medication; she has applied for Medicaid, application pending; she stated that she has a walker and cane and use them as needed. CM will continue to follow for progression of care.  Expected Discharge Plan: Home/Self Care Barriers to Discharge: No Barriers Identified   Patient Goals and CMS Choice Patient states their goals for this hospitalization and ongoing recovery are:: to breathe better CMS Medicare.gov Compare Post Acute Care list provided to:: Patient Choice offered to / list presented to : NA  Expected Discharge Plan and Services Expected Discharge Plan: Home/Self Care In-house Referral: NA Discharge Planning Services: CM Consult Post Acute Care Choice: NA Living arrangements for the past 2 months: Single Family Home                 DME Arranged: NIV DME Agency: NA HH Arranged: NA HH Agency: NA  Prior Living Arrangements/Services Living arrangements for the past 2 months: Single Family Home Lives with:: Self Patient language and need for interpreter reviewed:: No Do you feel safe going back to the place where you live?: Yes      Need for Family Participation in Patient Care: No (Comment) Care giver support system in place?: Yes (comment)   Criminal Activity/Legal Involvement Pertinent to Current Situation/Hospitalization: No - Comment as needed  Activities of Daily Living Home Assistive Devices/Equipment: None ADL Screening (condition at time of admission) Patient's cognitive ability adequate  to safely complete daily activities?: Yes Is the patient deaf or have difficulty hearing?: No Does the patient have difficulty seeing, even when wearing glasses/contacts?: No Does the patient have difficulty concentrating, remembering, or making decisions?: No Patient able to express need for assistance with ADLs?: Yes Does the patient have difficulty dressing or bathing?: No Independently performs ADLs?: Yes (appropriate for developmental age) Does the patient have difficulty walking or climbing stairs?: No Weakness of Legs: None Weakness of Arms/Hands: None  Permission Sought/Granted Permission sought to share information with : Case Manager Permission granted to share information with : Yes, Verbal Permission Granted  Share Information with NAME: Tiffany - sister           Emotional Assessment Appearance:: Developmentally appropriate Attitude/Demeanor/Rapport: Gracious, Engaged Affect (typically observed): Accepting Orientation: : Oriented to Self, Oriented to  Time, Oriented to Place, Oriented to Situation Alcohol / Substance Use: Not Applicable Psych Involvement: No (comment)  Admission diagnosis:  Severe persistent asthma with exacerbation [J45.51] Patient Active Problem List   Diagnosis Date Noted  . Acute respiratory distress 08/03/2018  . Vitamin D deficiency 05/26/2018  . Acute asthma exacerbation 05/16/2018  . Urinary incontinence 11/17/2017  . Nausea with vomiting 11/06/2017  . Hypomagnesemia 10/27/2017  . Dysphagia 10/27/2017  . Physical deconditioning 10/25/2017  . Acute maxillary sinusitis 10/24/2017  . Emesis, persistent 10/08/2017  . Volume depletion 10/08/2017  . Pleuritic chest pain 10/08/2017  . SOB (shortness of breath) 10/08/2017  . Debility 09/22/2017  . HCAP (healthcare-associated pneumonia) 08/06/2017  . Chronic atrial  fibrillation 07/29/2017  . AF (paroxysmal atrial fibrillation) ([place])   . COPD with acute exacerbation ([place]) 02/10/2017  .  Dyspnea 02/04/2017  . Chronic diastolic CHF (congestive heart failure) ([place]) 01/17/2017  . Asthma exacerbation 01/17/2017  . Metabolic syndrome 63/84/6659  . Moderate persistent asthma with exacerbation 10/19/2016  . Normocytic anemia 10/02/2016  . Elevated hemoglobin A1c 05/05/2016  . GERD (gastroesophageal reflux disease) 08/30/2015  . Anxiety and depression 08/30/2015  . Generalized anxiety disorder 08/30/2015  . Seasonal allergic rhinitis 08/29/2013  . Leukocytosis 10/21/2012  . Tobacco abuse in remission 10/07/2012  . COPD mixed type ([place]) 05/07/2012  . Knee pain, bilateral 04/25/2011  . Primary insomnia 03/14/2011  . OSA on CPAP 12/19/2010  . Obstructive sleep apnea 12/19/2010  . Hypokalemia 08/13/2010  . Cervical back pain with evidence of disc disease 04/08/2008  . Essential hypertension 07/31/2006  . Morbid obesity with body mass index of 50.0-59.9 in adult ([place]) 06/17/2006  . Major depressive disorder, recurrent episode ([place]) 04/10/2006   PCP:  [name], MD Pharmacy:   [facility] [place], [place] [address] Phone: [phone] Fax: [phone]     Social Determinants of Health (SDOH) Interventions    Readmission Risk Interventions No flowsheet data found.

## 2018-08-03 NOTE — Progress Notes (Signed)
[facility] PROGRESS NOTE  LEEANNA SLABY YTK:[id number] DOB: 24-Jan-1971 DOA: 08/02/2018 PCP: [name], MD  Assessment/Plan:  Acute respiratory distress related to asthma/copd exacerbation. Improved this am. Respiratory rate and oxygen saturation levels within limits of normal.  -see #2  Asthma/COPD exacerbation: Patient continues with productive cough, shortness of breath, wheezing on auscultation. Chest x-ray has no infiltration or pulmonary edema. Afebrile and non-toxic appearing. No sick contacts or recent traveling.  No neutropenia, and liver function normal.  Very low suspicion for COVID19. Influenza panel negative, respiratory panel + for coronavirus OC43 (common cold).  No events on tele -will add scheduled nebs vs in haler -Continue home Singulair -Solu-Medrol 60 mg IV tid as remains wheezy -Z pak -Mucinex for cough  -Incentive spirometry -Follow up sputum culture as able.  -monitor oxygen saturation levels  Major depressive disorder, recurrent episode ([place]): stable at baseline -Cymbalta  Essential hypertension: Bp 200/154-->171/62> 163/85 this am. Improved but still only fair control. ?ACE inhibitor. Hx of non-compliance -Continue metoprolol - continue home Lasix for CHF -will add scheduled hydralazine -IV hydralazine as needed  OSA -on CPAP but refused   Tobacco abuse in remission: -Nicotine patch  GERD (gastroesophageal reflux disease): -Protonix  Chronic diastolic CHF (congestive heart failure) ([place]): compensated. Hx of non-compliance with meds and diet.  2D echo 05/17/2018 showed EF 60-65% with grade 2 diastolic dysfunction. BNP is 32. Chest xray without infiltrate or edema. -Continue home Lasix. -intake and output -daily weight  Atrial Fibrillation (PAF): CHA2DS2-VASc Score is 3, needs oral anticoagulation. Patient home meds include Eliquis.  Patient has not been completely compliant to taking Eliquis.   -Continue Eliquis and  metoprolol -Educated pt about the importance of being compliant with taking Eliquis   Code Status: full Family Communication: none present (indicate person spoken with, relationship, and if by phone, the number) Disposition Plan: home hopefully tomorrow   Consultants:    Procedures:    Antibiotics:  Azithromycing 08/03/18>>  HPI/Subjective: April [name] is a 48 y.o. female with medical history significant of COPD, asthma, former smoker, hypertension, hyperlipidemia, GERD, depression, anxiety, CHF, OSA on CPAP, morbid obesity, PAF on Eliquis, who presented 4/520 with acute respiratory distress related to asthma/copd exacerbation.  Objective: Vitals:   08/03/18 0742 08/03/18 0820  BP: (!) 163/85   Pulse: 82   Resp: 20   Temp: 98.3 F (36.8 C)   SpO2: 100% 100%    Intake/Output Summary (Last 24 hours) at 08/03/2018 0950 Last data filed at 08/03/2018 0739 Gross per 24 hour  Intake 750 ml  Output -  Net 750 ml   Filed Weights   08/02/18 1918 08/03/18 0500  Weight: (!) 140.2 kg (!) 142.9 kg    Exam:   General:  Lying flat in bed eyes closed lights off. Responds to verbal stimuli. No acute distress  Cardiovascular: rrr no mgr trace LE edema  Respiratory: mild increased work of breathing with conversation. Frequent moist coughing. Good air movement but wheezes particularly upper lobes. No crackles  Abdomen: obese soft +BS no guarding or rebounding  Musculoskeletal: joints without swelling or erythema   Data Reviewed: Basic Metabolic Panel: Recent Labs  Lab 08/02/18 1959 08/02/18 2011  NA 132* 136  K 4.0 4.0  CL 101  --   CO2 25  --   GLUCOSE 91  --   BUN 7  --   CREATININE 0.70  --   CALCIUM 8.3*  --    Liver Function Tests: Recent Labs  Lab 08/02/18  1959  AST 17  ALT 7  ALKPHOS 82  BILITOT <0.1*  PROT 6.5  ALBUMIN 3.0*   No results for input(s): LIPASE, AMYLASE in the last 168 hours. No results for input(s): AMMONIA in the last 168  hours. CBC: Recent Labs  Lab 08/02/18 1959 08/02/18 2011  WBC 10.9*  --   NEUTROABS 6.5  --   HGB 12.5 13.3  HCT 39.7 39.0  MCV 87.6  --   PLT 404*  --    Cardiac Enzymes: Recent Labs  Lab 08/02/18 2013  TROPONINI <0.03   BNP (last 3 results) Recent Labs    10/08/17 1244 05/17/18 0447 08/02/18 2013  BNP 29.9 176.5* 32.0    ProBNP (last 3 results) No results for input(s): PROBNP in the last 8760 hours.  CBG: No results for input(s): GLUCAP in the last 168 hours.  Recent Results (from the past 240 hour(s))  Respiratory Panel by PCR     Status: Abnormal   Collection Time: 08/02/18 11:01 PM  Result Value Ref Range Status   Adenovirus NOT DETECTED NOT DETECTED Final   Coronavirus 229E NOT DETECTED NOT DETECTED Final    Comment: (NOTE) The Coronavirus on the Respiratory Panel, DOES NOT test for the novel  Coronavirus (2019 nCoV)    Coronavirus HKU1 NOT DETECTED NOT DETECTED Final   Coronavirus NL63 NOT DETECTED NOT DETECTED Final   Coronavirus OC43 DETECTED (A) NOT DETECTED Final   Metapneumovirus NOT DETECTED NOT DETECTED Final   Rhinovirus / Enterovirus NOT DETECTED NOT DETECTED Final   Influenza A NOT DETECTED NOT DETECTED Final   Influenza B NOT DETECTED NOT DETECTED Final   Parainfluenza Virus 1 NOT DETECTED NOT DETECTED Final   Parainfluenza Virus 2 NOT DETECTED NOT DETECTED Final   Parainfluenza Virus 3 NOT DETECTED NOT DETECTED Final   Parainfluenza Virus 4 NOT DETECTED NOT DETECTED Final   Respiratory Syncytial Virus NOT DETECTED NOT DETECTED Final   Bordetella pertussis NOT DETECTED NOT DETECTED Final   Chlamydophila pneumoniae NOT DETECTED NOT DETECTED Final   Mycoplasma pneumoniae NOT DETECTED NOT DETECTED Final    Comment: Performed at [facility], [address], [place], [place] [address]  MRSA PCR Screening     Status: None   Collection Time: 08/03/18  1:55 AM  Result Value Ref Range Status   MRSA by PCR NEGATIVE NEGATIVE Final     Comment:        The GeneXpert MRSA Assay (FDA approved for NASAL specimens only), is one component of a comprehensive MRSA colonization surveillance program. It is not intended to diagnose MRSA infection nor to guide or monitor treatment for MRSA infections. Performed at [facility], [place] [address], [place], [place] [address]      Studies: Dg Chest Portable 1 View  Result Date: 08/02/2018 CLINICAL DATA:  Dyspnea EXAM: PORTABLE CHEST 1 VIEW COMPARISON:  05/21/2018 FINDINGS: Mild cardiomegaly without pulmonary edema. No focal airspace consolidation. No pleural effusion or pneumothorax. IMPRESSION: Mild cardiomegaly without pulmonary edema. Electronically Signed   By: Ulyses Jarred M.D.   On: 08/02/2018 20:20    Scheduled Meds: . apixaban  5 mg Oral BID  . azithromycin  500 mg Oral Daily   Followed by  . [START ON 08/04/2018] azithromycin  250 mg Oral Daily  . DULoxetine  60 mg Oral Daily  . ferrous sulfate  325 mg Oral BID  . fluticasone  1 spray Each Nare Daily  . furosemide  40 mg Oral  Daily  . gabapentin  600 mg Oral BID  . ipratropium-albuterol  3 mL Nebulization Q6H  . loratadine  10 mg Oral Daily  . methylPREDNISolone (SOLU-MEDROL) injection  60 mg Intravenous Q8H  . metoprolol tartrate  50 mg Oral BID  . mometasone-formoterol  2 puff Inhalation BID  . montelukast  10 mg Oral QHS  . nicotine  21 mg Transdermal Daily  . pantoprazole  40 mg Oral Daily  . zolpidem  5 mg Oral QHS   Continuous Infusions:  Principal Problem:   Acute respiratory distress Active Problems:   Essential hypertension   Asthma exacerbation   COPD with acute exacerbation (HCC)   AF (paroxysmal atrial fibrillation) (HCC)   OSA on CPAP   GERD (gastroesophageal reflux disease)   Chronic diastolic CHF (congestive heart failure) (HCC)   Major depressive disorder, recurrent episode ([place])   Tobacco abuse in remission    Time spent: 76 minutes    [place] NP  [facility]   If 7PM-7AM, please contact night-coverage at [email or web address], password [facility] 08/03/2018, 9:50 AM  LOS: 0 days

## 2018-08-03 NOTE — Discharge Instructions (Signed)
Information on my medicine - ELIQUIS® (apixaban) ° °This medication education was reviewed with me or my healthcare representative as part of my discharge preparation.  The pharmacist that spoke with me during my hospital stay was:  Demontay Grantham C, RPH ° °Why was Eliquis® prescribed for you? °Eliquis® was prescribed for you to reduce the risk of forming blood clots that can cause a stroke if you have a medical condition called atrial fibrillation (a type of irregular heartbeat) OR to reduce the risk of a blood clots forming after orthopedic surgery. ° °What do You need to know about Eliquis® ? °Take your Eliquis® TWICE DAILY - one tablet in the morning and one tablet in the evening with or without food.  It would be best to take the doses about the same time each day. ° °If you have difficulty swallowing the tablet whole please discuss with your pharmacist how to take the medication safely. ° °Take Eliquis® exactly as prescribed by your doctor and DO NOT stop taking Eliquis® without talking to the doctor who prescribed the medication.  Stopping may increase your risk of developing a new clot or stroke.  Refill your prescription before you run out. ° °After discharge, you should have regular check-up appointments with your healthcare provider that is prescribing your Eliquis®.  In the future your dose may need to be changed if your kidney function or weight changes by a significant amount or as you get older. ° °What do you do if you miss a dose? °If you miss a dose, take it as soon as you remember on the same day and resume taking twice daily.  Do not take more than one dose of ELIQUIS at the same time. ° °Important Safety Information °A possible side effect of Eliquis® is bleeding. You should call your healthcare provider right away if you experience any of the following: °? Bleeding from an injury or your nose that does not stop. °? Unusual colored urine (red or dark brown) or unusual colored stools (red or  black). °? Unusual bruising for unknown reasons. °? A serious fall or if you hit your head (even if there is no bleeding). ° °Some medicines may interact with Eliquis® and might increase your risk of bleeding or clotting while on Eliquis®. To help avoid this, consult your healthcare provider or pharmacist prior to using any new prescription or non-prescription medications, including herbals, vitamins, non-steroidal anti-inflammatory drugs (NSAIDs) and supplements. ° °This website has more information on Eliquis® (apixaban): [email or web address].com. ° ° °

## 2018-08-03 NOTE — Progress Notes (Signed)
   08/03/18 0156  Vitals  Temp 98.8 F (37.1 C)  Temp Source Oral  BP (!) 164/85  MAP (mmHg) 107  BP Location Right Arm  BP Method Automatic  Patient Position (if appropriate) Sitting  Pulse Rate 82  Pulse Rate Source Monitor  Resp (!) 22  Oxygen Therapy  SpO2 100 %  O2 Device Room Air  MEWS Score  MEWS RR 1  MEWS Pulse 0  MEWS Systolic 0  MEWS LOC 0  MEWS Temp 0  MEWS Score 1  MEWS Score Color Green  Admitted pt to rm 3E10 from ED, pt alert and oriented, denied pain at this time, oriented to room, call bell placed within reach, placed on cardiac monitor, CCMD made aware.

## 2018-08-03 NOTE — ED Notes (Addendum)
ED TO INPATIENT HANDOFF REPORT  ED Nurse Name and Phone #:  Dalphine Handing [phone]  S Name/Age/Gender April [name] 48 y.o. female Room/Bed: 024C/024C  Code Status   Code Status: Full Code  Home/SNF/Other Home Patient oriented to: self, place, time and situation Is this baseline? Yes   Triage Complete: Triage complete  Chief Complaint SOB  Triage Note Pt. BIB EMS c/o worsening SOB over the past 2 days. Pt. States HX of asthma and COPD. Pt. Says "my rescue inhaler was not working." Pt. States chest tightness worsening pain with dry cough. Dyspnea upon exertion. Pt. Denies travel or being around anyone that has been sick. Currently, pt. Labored breathing at 100% on Room Air.    Allergies Allergies  Allergen Reactions  . Contrast Media [Iodinated Diagnostic Agents] Itching    Ct contrast    Level of Care/Admitting Diagnosis ED Disposition    ED Disposition Condition Comment   Admit  Hospital Area: [facility] [[id number]]  Level of Care: Telemetry Medical [104]  I expect the patient will be discharged within 24 hours: No (not a candidate for 5C-Observation unit)  Diagnosis: Asthma exacerbation [329924]  Admitting Physician: [name] [[id number]]  Attending Physician: [name] [[id number]]  PT Class (Do Not Modify): Observation [104]  PT Acc Code (Do Not Modify): Observation [10022]       B Medical/Surgery History Past Medical History:  Diagnosis Date  . Acanthosis nigricans   . Anxiety   . Arthritis    "knees" (04/28/2017)  . Asthma    Followed by Dr. Annamaria Boots (pulmonology); receives every other week omalizumab injections; has frequent exacerbations  . Chronic diastolic CHF (congestive heart failure) ([place]) 01/17/2017  . COPD (chronic obstructive pulmonary disease) ([place])    PFTs in 2002, FEV1/FVC 65, no post bronchodilater test done  . Depression   . GERD (gastroesophageal reflux disease)   . Headache(784.0)    "q couple days" (04/28/2017)  . Helicobacter pylori  (H. pylori) infection   . Hypertension, essential   . Insomnia   . Menorrhagia   . Morbid obesity ([place])   . OSA on CPAP    Sleep study 2008 - mild OSA, not enough events to titrate CPAP; wears CPAP now/pt on 04/28/2017  . Pneumonia X 1  . Seasonal allergies   . Shortness of breath   . Tobacco user    Past Surgical History:  Procedure Laterality Date  . CARDIOVERSION N/A 05/30/2017   Procedure: CARDIOVERSION;  Surgeon: Sanda Klein, MD;  Location: [facility];  Service: Cardiovascular;  Laterality: N/A;  . REDUCTION MAMMAPLASTY Bilateral 09/2011  . TUBAL LIGATION  1996   bilateral     A IV Location/Drains/Wounds Patient Lines/Drains/Airways Status   Active Line/Drains/Airways    Name:   Placement date:   Placement time:   Site:   Days:   Peripheral IV 08/02/18 Left Antecubital   08/02/18    1912    Antecubital   1          Intake/Output Last 24 hours  Intake/Output Summary (Last 24 hours) at 08/03/2018 0030 Last data filed at 08/02/2018 2333 Gross per 24 hour  Intake 50 ml  Output -  Net 50 ml    Labs/Imaging Results for orders placed or performed during the hospital encounter of 08/02/18 (from the past 48 hour(s))  CBC with Differential     Status: Abnormal   Collection Time: 08/02/18  7:59 PM  Result Value Ref Range   WBC 10.9 (H) 4.0 -  10.5 K/uL   RBC 4.53 3.87 - 5.11 MIL/uL   Hemoglobin 12.5 12.0 - 15.0 g/dL   HCT 39.7 36.0 - 46.0 %   MCV 87.6 80.0 - 100.0 fL   MCH 27.6 26.0 - 34.0 pg   MCHC 31.5 30.0 - 36.0 g/dL   RDW 17.4 (H) 11.5 - 15.5 %   Platelets 404 (H) 150 - 400 K/uL   nRBC 0.0 0.0 - 0.2 %   Neutrophils Relative % 59 %   Neutro Abs 6.5 1.7 - 7.7 K/uL   Lymphocytes Relative 25 %   Lymphs Abs 2.8 0.7 - 4.0 K/uL   Monocytes Relative 8 %   Monocytes Absolute 0.9 0.1 - 1.0 K/uL   Eosinophils Relative 6 %   Eosinophils Absolute 0.7 (H) 0.0 - 0.5 K/uL   Basophils Relative 1 %   Basophils Absolute 0.1 0.0 - 0.1 K/uL   Immature Granulocytes 1 %    Abs Immature Granulocytes 0.08 (H) 0.00 - 0.07 K/uL    Comment: Performed at [facility] [address], [place], [place] [address]  Comprehensive metabolic panel     Status: Abnormal   Collection Time: 08/02/18  7:59 PM  Result Value Ref Range   Sodium 132 (L) 135 - 145 mmol/L   Potassium 4.0 3.5 - 5.1 mmol/L   Chloride 101 98 - 111 mmol/L   CO2 25 22 - 32 mmol/L   Glucose, Bld 91 70 - 99 mg/dL   BUN 7 6 - 20 mg/dL   Creatinine, Ser 0.70 0.44 - 1.00 mg/dL   Calcium 8.3 (L) 8.9 - 10.3 mg/dL   Total Protein 6.5 6.5 - 8.1 g/dL   Albumin 3.0 (L) 3.5 - 5.0 g/dL   AST 17 15 - 41 U/L   ALT 7 0 - 44 U/L   Alkaline Phosphatase 82 38 - 126 U/L   Total Bilirubin <0.1 (L) 0.3 - 1.2 mg/dL   GFR calc non Af Amer >60 >60 mL/min   GFR calc Af Amer >60 >60 mL/min   Anion gap 6 5 - 15    Comment: Performed at [facility] [address], [place], [place] [address]  POCT I-Stat EG7     Status: Abnormal   Collection Time: 08/02/18  8:11 PM  Result Value Ref Range   pH, Ven 7.340 7.250 - 7.430   pCO2, Ven 49.6 44.0 - 60.0 mmHg   pO2, Ven 41.0 32.0 - 45.0 mmHg   Bicarbonate 26.8 20.0 - 28.0 mmol/L   TCO2 28 22 - 32 mmol/L   O2 Saturation 72.0 %   Sodium 136 135 - 145 mmol/L   Potassium 4.0 3.5 - 5.1 mmol/L   Calcium, Ion 1.14 (L) 1.15 - 1.40 mmol/L   HCT 39.0 36.0 - 46.0 %   Hemoglobin 13.3 12.0 - 15.0 g/dL   Patient temperature HIDE    Sample type VENOUS   Troponin I - ONCE - STAT     Status: None   Collection Time: 08/02/18  8:13 PM  Result Value Ref Range   Troponin I <0.03 <0.03 ng/mL    Comment: Performed at [facility], [place] [address] [place], [place] [address]  Brain natriuretic peptide     Status: None   Collection Time: 08/02/18  8:13 PM  Result Value Ref Range   B Natriuretic Peptide 32.0 0.0 - 100.0 pg/mL    Comment: Performed at [facility] [address] [place], [place]  [address]   *Note: Due to a large number of results and/or encounters for  the requested time period, some results have not been displayed. A complete set of results can be found in Results Review.   Dg Chest Portable 1 View  Result Date: 08/02/2018 CLINICAL DATA:  Dyspnea EXAM: PORTABLE CHEST 1 VIEW COMPARISON:  05/21/2018 FINDINGS: Mild cardiomegaly without pulmonary edema. No focal airspace consolidation. No pleural effusion or pneumothorax. IMPRESSION: Mild cardiomegaly without pulmonary edema. Electronically Signed   By: Ulyses Jarred M.D.   On: 08/02/2018 20:20    Pending Labs Unresulted Labs (From admission, onward)    Start     Ordered   08/02/18 2302  HIV antibody (Routine Testing)  Once,   R     08/02/18 2303   08/02/18 2301  Influenza panel by PCR (type A & B)  Once,   R     08/02/18 2300   08/02/18 2301  Respiratory Panel by PCR  (Respiratory virus panel with precautions)  Once,   R     08/02/18 2300          Vitals/Pain Today's Vitals   08/02/18 2215 08/02/18 2315 08/03/18 0000 08/03/18 0025  BP: (!) 154/112   (!) 180/113  Pulse: 80 74 81   Resp: 18 19 (!) 24 18  Temp:      TempSrc:      SpO2: 97% 100% 99% 99%  Weight:      Height:      PainSc:    0-No pain    Isolation Precautions Droplet precaution  Medications Medications  methylPREDNISolone sodium succinate (SOLU-MEDROL) 125 mg/2 mL injection 60 mg (has no administration in time range)  furosemide (LASIX) tablet 40 mg (has no administration in time range)  metoprolol tartrate (LOPRESSOR) tablet 50 mg (has no administration in time range)  DULoxetine (CYMBALTA) DR capsule 60 mg (has no administration in time range)  nicotine (NICODERM CQ - dosed in mg/24 hours) patch 21 mg (has no administration in time range)  zolpidem (AMBIEN) tablet 5 mg (5 mg Oral Given 08/03/18 0022)  pantoprazole (PROTONIX) EC tablet 40 mg (has no administration in time range)  apixaban (ELIQUIS) tablet 5 mg (5 mg Oral Given 08/03/18 0022)  ferrous sulfate tablet 325 mg (325 mg Oral Given 08/03/18 0022)   gabapentin (NEURONTIN) capsule 600 mg (600 mg Oral Given 08/03/18 0021)  loratadine (CLARITIN) tablet 10 mg (has no administration in time range)  fluticasone (FLONASE) 50 MCG/ACT nasal spray 1 spray (has no administration in time range)  mometasone-formoterol (DULERA) 200-5 MCG/ACT inhaler 2 puff (has no administration in time range)  montelukast (SINGULAIR) tablet 10 mg (has no administration in time range)  ipratropium-albuterol (DUONEB) 0.5-2.5 (3) MG/3ML nebulizer solution 3 mL (has no administration in time range)  albuterol (PROVENTIL) (2.5 MG/3ML) 0.083% nebulizer solution 2.5 mg (has no administration in time range)  azithromycin (ZITHROMAX) tablet 500 mg (has no administration in time range)    Followed by  azithromycin (ZITHROMAX) tablet 250 mg (has no administration in time range)  hydrALAZINE (APRESOLINE) injection 5 mg (has no administration in time range)  oxyCODONE-acetaminophen (PERCOCET/ROXICET) 5-325 MG per tablet 1 tablet (has no administration in time range)    And  oxyCODONE (Oxy IR/ROXICODONE) immediate release tablet 5 mg (has no administration in time range)  Ipratropium-Albuterol (COMBIVENT) respimat 1 puff (1 puff Inhalation Given 08/02/18 2021)  albuterol (PROVENTIL HFA;VENTOLIN HFA) 108 (90 Base) MCG/ACT inhaler 7 puff (7 puffs Inhalation Given 08/02/18 1955)  metoprolol  tartrate (LOPRESSOR) tablet 50 mg (50 mg Oral Given 08/02/18 2046)  methylPREDNISolone sodium succinate (SOLU-MEDROL) 125 mg/2 mL injection 125 mg (125 mg Intravenous Given 08/02/18 2233)  magnesium sulfate IVPB 2 g 50 mL (0 g Intravenous Stopped 08/02/18 2333)    Mobility walks Low fall risk   Focused Assessments Respiratory    R Recommendations: See Admitting Provider Note  Report given to:   Additional Notes:  SOB has improved. Dyspnea on exertion. Wheezing bilateral.

## 2018-08-04 DIAGNOSIS — I480 Paroxysmal atrial fibrillation: Secondary | ICD-10-CM

## 2018-08-04 DIAGNOSIS — K219 Gastro-esophageal reflux disease without esophagitis: Secondary | ICD-10-CM

## 2018-08-04 LAB — CBC
HCT: 40.1 % (ref 36.0–46.0)
Hemoglobin: 12.9 g/dL (ref 12.0–15.0)
MCH: 27.5 pg (ref 26.0–34.0)
MCHC: 32.2 g/dL (ref 30.0–36.0)
MCV: 85.5 fL (ref 80.0–100.0)
Platelets: 417 10*3/uL — ABNORMAL HIGH (ref 150–400)
RBC: 4.69 MIL/uL (ref 3.87–5.11)
RDW: 17.3 % — ABNORMAL HIGH (ref 11.5–15.5)
WBC: 15 10*3/uL — ABNORMAL HIGH (ref 4.0–10.5)
nRBC: 0 % (ref 0.0–0.2)

## 2018-08-04 MED ORDER — POTASSIUM CHLORIDE CRYS ER 20 MEQ PO TBCR: 20.0000 meq | EXTENDED_RELEASE_TABLET | Freq: Every day | ORAL | 3 refills | Status: DC

## 2018-08-04 MED ORDER — AZITHROMYCIN 250 MG PO TABS: ORAL_TABLET | ORAL | 0 refills | Status: DC

## 2018-08-04 MED ORDER — PREDNISONE 10 MG PO TABS: ORAL_TABLET | ORAL | 0 refills | Status: DC

## 2018-08-04 MED ORDER — HYDRALAZINE HCL 10 MG PO TABS: 10.0000 mg | ORAL_TABLET | Freq: Three times a day (TID) | ORAL | 1 refills | Status: DC

## 2018-08-04 NOTE — Discharge Summary (Addendum)
Physician Discharge Summary  April [name] GLO:[id number] DOB: October 03, 1970 DOA: 08/02/2018  PCP: [name], MD  Admit date: 08/02/2018 Discharge date: 08/04/2018  Time spent: 45 minutes  Recommendations for Outpatient Follow-up:  1. Follow up with PCP in 1-2 weeks for evaluation of symptoms and BP control as hydralazine added to anti-hypertensive med 2. Take medication as prescribed 3. Stop smoking   Discharge Diagnoses:  Principal Problem:   Acute respiratory distress Active Problems:   Essential hypertension   Asthma exacerbation   COPD with acute exacerbation (HCC)   AF (paroxysmal atrial fibrillation) (HCC)   OSA on CPAP   GERD (gastroesophageal reflux disease)   Chronic diastolic CHF (congestive heart failure) (HCC)   Major depressive disorder, recurrent episode (HCC)   Tobacco abuse in remission   Acute exacerbation of chronic obstructive pulmonary disease (COPD) ([place])   Discharge Condition: stable  Diet recommendation: heart healthy  Filed Weights   08/02/18 1918 08/03/18 0500 08/04/18 0532  Weight: (!) 140.2 kg (!) 142.9 kg (!) 139.9 kg    History of present illness:  April [name] is a 48 y.o. female admitted with  COPD exacerbation caused by the common coronavirus (NOT COVID 19). Provided with  IV Steroids today and scheduled nebs.   Hospital Course:  Acute respiratory distress related to asthma/copd exacerbation. Resolved at discharge.  Respiratory rate and oxygen saturation levels within limits of normal.  -see #2  Asthma/COPD exacerbation:resolved at discharge. Chest x-ray has no infiltration or pulmonary edema. Afebrile and non-toxic appearing.No sick contacts or recent traveling. No neutropenia, andliver function normal. Very low suspicion for COVID19. Influenza panel negative, respiratory panel + for coronavirus OC43 (common cold).  No events on tele.  Major depressive disorder, recurrent episode ([place]): stable at baseline   Essential  hypertension: Poorly controlled. Hx of non-compliance. Added hydralazine. Follow up with PCP   OSA -on CPAP but refused  Tobacco abuse in remission  Procedures:    Consultations:    Discharge Exam: Vitals:   08/04/18 0837 08/04/18 0856  BP:  (!) 163/81  Pulse: 66 80  Resp: 16   Temp:  98.3 F (36.8 C)  SpO2: 99% 100%    General: sitting up in bed. No acute distress Cardiovascular: rrr no mgr no LE edema Respiratory: normal effort with conversation. BS slightly distant but good air movement without wheeze or crackles  Discharge Instructions   Discharge Instructions    Call MD for:  difficulty breathing, headache or visual disturbances   Complete by:  As directed    Call MD for:  persistant dizziness or light-headedness   Complete by:  As directed    Diet - low sodium heart healthy   Complete by:  As directed    Discharge instructions   Complete by:  As directed    Take medication as prescribed Follow up with PCP in 1-2 weeks for evaluation of symptoms Stay inside   Increase activity slowly   Complete by:  As directed      Allergies as of 08/04/2018      Reactions   Contrast Media [iodinated Diagnostic Agents] Itching   Ct contrast      Medication List    STOP taking these medications   polyethylene glycol packet Commonly known as:  MIRALAX / GLYCOLAX     TAKE these medications   albuterol (2.5 MG/3ML) 0.083% nebulizer solution Commonly known as:  PROVENTIL Take 3 mLs (2.5 mg total) by nebulization every 6 (six) hours as needed for wheezing  or shortness of breath. What changed:  Another medication with the same name was changed. Make sure you understand how and when to take each.   albuterol 108 (90 Base) MCG/ACT inhaler Commonly known as:  PROVENTIL HFA;VENTOLIN HFA INHALE 2 PUFFS INTO THE LUNGS EVERY 4 HOURS AS NEEDED FOR WHEEZING OR SHORTNESS OF BREATH What changed:  See the new instructions.   azithromycin 250 MG tablet Commonly known as:   ZITHROMAX Take one tab daily for 3 days Start taking on:  August 05, 2018   cetirizine 10 MG tablet Commonly known as:  ZYRTEC Take 10 mg by mouth daily.   DULoxetine 60 MG capsule Commonly known as:  Cymbalta Take 1 capsule (60 mg total) by mouth daily.   Eliquis 5 MG Tabs tablet Generic drug:  apixaban Take 5 mg by mouth 2 (two) times daily.   ergocalciferol 1.25 MG (50000 UT) capsule Commonly known as:  Drisdol Take 1 capsule (50,000 Units total) by mouth once a week. What changed:  when to take this   fluticasone 50 MCG/ACT nasal spray Commonly known as:  FLONASE Place 1 spray into both nostrils daily.   furosemide 40 MG tablet Commonly known as:  LASIX Take 40 mg by mouth daily.   gabapentin 300 MG capsule Commonly known as:  NEURONTIN Take 2 capsules (600 mg total) by mouth 2 (two) times daily.   guaiFENesin 600 MG 12 hr tablet Commonly known as:  MUCINEX Take 2 tablets (1,200 mg total) by mouth 2 (two) times daily.   hydrALAZINE 10 MG tablet Commonly known as:  APRESOLINE Take 1 tablet (10 mg total) by mouth every 8 (eight) hours for 30 days.   ipratropium-albuterol 0.5-2.5 (3) MG/3ML Soln Commonly known as:  DUONEB Inhale 3 ml two times daily and every 2 hours as needed for shortness of breath or wheezing Inhale 3 ml two times daily and every 2 hours as needed for shortness of breath or wheezing What changed:    how much to take  how to take this  when to take this  reasons to take this  additional instructions   Iron 325 (65 Fe) MG Tabs Take 1 tablet (325 mg total) by mouth every morning. What changed:  when to take this   metoprolol tartrate 50 MG tablet Commonly known as:  LOPRESSOR Take 1 tablet (50 mg total) by mouth 2 (two) times daily.   mometasone-formoterol 200-5 MCG/ACT Aero Commonly known as:  Dulera Inhale 2 puffs into the lungs 2 (two) times daily.   montelukast 10 MG tablet Commonly known as:  SINGULAIR Take 10 mg by mouth at  bedtime.   nicotine 21 mg/24hr patch Commonly known as:  NICODERM CQ - dosed in mg/24 hours Place 1 patch (21 mg total) onto the skin daily.   omeprazole 20 MG capsule Commonly known as:  PRILOSEC TAKE 1 CAPSULE BY MOUTH EVERY DAY What changed:  how much to take   oxyCODONE-acetaminophen 10-325 MG tablet Commonly known as:  PERCOCET Take 1 tablet by mouth every 4 (four) hours as needed for pain.   potassium chloride SA 20 MEQ tablet Commonly known as:  K-DUR,KLOR-CON Take 1 tablet (20 mEq total) by mouth daily.   predniSONE 10 MG tablet Commonly known as:  DELTASONE Take 4 tabs for 3 days then take 3 tabs for 3 days then take 2 tabs for 3 days then take 1 tab for 3 days then stop.   zolpidem 5 MG tablet Commonly known as:  AMBIEN TAKE 1 TABLET(5 MG) BY MOUTH AT BEDTIME What changed:  See the new instructions.      Allergies  Allergen Reactions  . Contrast Media [Iodinated Diagnostic Agents] Itching    Ct contrast   Follow-up Information    [name], MD. Go on 08/19/2018.   Specialty:  Family Medicine Why:  @2 :30pm Contact information: [place] [address] [phone]            The results of significant diagnostics from this hospitalization (including imaging, microbiology, ancillary and laboratory) are listed below for reference.    Significant Diagnostic Studies: Dg Chest Portable 1 View  Result Date: 08/02/2018 CLINICAL DATA:  Dyspnea EXAM: PORTABLE CHEST 1 VIEW COMPARISON:  05/21/2018 FINDINGS: Mild cardiomegaly without pulmonary edema. No focal airspace consolidation. No pleural effusion or pneumothorax. IMPRESSION: Mild cardiomegaly without pulmonary edema. Electronically Signed   By: Ulyses Jarred M.D.   On: 08/02/2018 20:20    Microbiology: Recent Results (from the past 240 hour(s))  Respiratory Panel by PCR     Status: Abnormal   Collection Time: 08/02/18 11:01 PM  Result Value Ref Range Status   Adenovirus NOT  DETECTED NOT DETECTED Final   Coronavirus 229E NOT DETECTED NOT DETECTED Final    Comment: (NOTE) The Coronavirus on the Respiratory Panel, DOES NOT test for the novel  Coronavirus (2019 nCoV)    Coronavirus HKU1 NOT DETECTED NOT DETECTED Final   Coronavirus NL63 NOT DETECTED NOT DETECTED Final   Coronavirus OC43 DETECTED (A) NOT DETECTED Final   Metapneumovirus NOT DETECTED NOT DETECTED Final   Rhinovirus / Enterovirus NOT DETECTED NOT DETECTED Final   Influenza A NOT DETECTED NOT DETECTED Final   Influenza B NOT DETECTED NOT DETECTED Final   Parainfluenza Virus 1 NOT DETECTED NOT DETECTED Final   Parainfluenza Virus 2 NOT DETECTED NOT DETECTED Final   Parainfluenza Virus 3 NOT DETECTED NOT DETECTED Final   Parainfluenza Virus 4 NOT DETECTED NOT DETECTED Final   Respiratory Syncytial Virus NOT DETECTED NOT DETECTED Final   Bordetella pertussis NOT DETECTED NOT DETECTED Final   Chlamydophila pneumoniae NOT DETECTED NOT DETECTED Final   Mycoplasma pneumoniae NOT DETECTED NOT DETECTED Final    Comment: Performed at [facility], [address], [place], [place] [address]  MRSA PCR Screening     Status: None   Collection Time: 08/03/18  1:55 AM  Result Value Ref Range Status   MRSA by PCR NEGATIVE NEGATIVE Final    Comment:        The GeneXpert MRSA Assay (FDA approved for NASAL specimens only), is one component of a comprehensive MRSA colonization surveillance program. It is not intended to diagnose MRSA infection nor to guide or monitor treatment for MRSA infections. Performed at [facility], [place] [address], [place], [place] [address]      Labs: Basic Metabolic Panel: Recent Labs  Lab 08/02/18 1959 08/02/18 2011  NA 132* 136  K 4.0 4.0  CL 101  --   CO2 25  --   GLUCOSE 91  --   BUN 7  --   CREATININE 0.70  --   CALCIUM 8.3*  --    Liver Function Tests: Recent Labs  Lab 08/02/18 1959  AST 17  ALT 7  ALKPHOS 82  BILITOT <0.1*  PROT 6.5   ALBUMIN 3.0*   No results for input(s): LIPASE, AMYLASE in the last 168 hours. No results for input(s): AMMONIA in the last 168  hours. CBC: Recent Labs  Lab 08/02/18 1959 08/02/18 2011 08/04/18 0507  WBC 10.9*  --  15.0*  NEUTROABS 6.5  --   --   HGB 12.5 13.3 12.9  HCT 39.7 39.0 40.1  MCV 87.6  --  85.5  PLT 404*  --  417*   Cardiac Enzymes: Recent Labs  Lab 08/02/18 2013  TROPONINI <0.03   BNP: BNP (last 3 results) Recent Labs    10/08/17 1244 05/17/18 0447 08/02/18 2013  BNP 29.9 176.5* 32.0    ProBNP (last 3 results) No results for input(s): PROBNP in the last 8760 hours.  CBG: No results for input(s): GLUCAP in the last 168 hours.     SignedRadene Gunning NP [facility] 08/04/2018, 10:38 AM

## 2018-08-05 ENCOUNTER — Telehealth: Payer: Self-pay

## 2018-08-05 NOTE — Telephone Encounter (Signed)
Transition Care Management Follow-up Telephone Call Date of discharge and from where:08/04/2018 , [facility]   Call placed to # [phone] and message was left requesting a call back to this CM # [phone].

## 2018-08-06 ENCOUNTER — Telehealth: Payer: Self-pay

## 2018-08-06 NOTE — Telephone Encounter (Signed)
Transition Care Management Follow-up Telephone Call  Date of discharge and from where: 08/04/2018, [facility]   How have you been since you were released from the hospital? She said that she is still congested but " feeling all right."   Any questions or concerns? No questions/concerns reported at this time.  She said that she has a face mask and is trying to stay inside as much as possible   Items Reviewed:  Did the pt receive and understand the discharge instructions provided? yes  Medications obtained and verified? She said that she has all medications including the newly ordered meds - azithromycin, hydralazine and prednisone.  She explained how she does her prednisone taper and said that she did not have any questions about her medications and did not need to review the list.    Any new allergies since your discharge? None reported   Do you have support at home? She is living alone but has family in the area  Other (ie: DME, [place], etc) she has a nebulizer and CPAP but does not like to use the CPAP.  She said that it just does not feel comfortable. She is not sure that she wants to be fit for another mask.   She has a walker but has not needed it.   Functional Questionnaire: (I = Independent and D = Dependent) ADL's: independent  Follow up appointments reviewed:    PCP Hospital f/u appt confirmed?  08/12/2018 @ 1050. Patient agreeable to [facility] f/u appt confirmed? Patient stated that she has an appointment at [facility] for pain management on 08/11/2018. She said that she is very comfortable with that clinic and would prefer to go there instead of [facility]. She will discuss a payment plan with Romelle Starcher and then decide if she can afford it. She also noted that she is supposed to start receiving medicare next month and would then have part B coverage for her appointments.   Are transportation arrangements needed?no  If their condition worsens,  is the pt aware to call  their PCP or go to the ED?   yes  Was the patient provided with contact information for the PCP's office or ED? She has the phone number for [facility]  Was the pt encouraged to call back with questions or concerns? Yes

## 2018-08-06 NOTE — Telephone Encounter (Signed)
FYI - From the discharge call.  She has appointment scheduled for 08/12/2018.  She has a CPAP machine but has not been using it because she does not like the mask.  She was not sure that she wanted to be referred for a new mask fitting.    Patient stated that she has an appointment at [facility] for pain management on 08/11/2018. She said that she is very comfortable with that clinic and would prefer to go there instead of [facility]. She will discuss a payment plan with Romelle Starcher and then decide if she can afford it. She also noted that she is supposed to start receiving medicare next month and would then have part B coverage for her appointments.

## 2018-08-12 ENCOUNTER — Ambulatory Visit: Payer: BLUE CROSS/BLUE SHIELD | Attending: Family Medicine | Admitting: Family Medicine

## 2018-08-12 ENCOUNTER — Other Ambulatory Visit: Payer: Self-pay

## 2018-08-12 ENCOUNTER — Encounter: Payer: Self-pay | Admitting: Family Medicine

## 2018-08-12 DIAGNOSIS — I5032 Chronic diastolic (congestive) heart failure: Secondary | ICD-10-CM

## 2018-08-12 DIAGNOSIS — I480 Paroxysmal atrial fibrillation: Secondary | ICD-10-CM | POA: Diagnosis not present

## 2018-08-12 DIAGNOSIS — J4541 Moderate persistent asthma with (acute) exacerbation: Secondary | ICD-10-CM

## 2018-08-12 DIAGNOSIS — G629 Polyneuropathy, unspecified: Secondary | ICD-10-CM | POA: Diagnosis not present

## 2018-08-12 DIAGNOSIS — K219 Gastro-esophageal reflux disease without esophagitis: Secondary | ICD-10-CM

## 2018-08-12 DIAGNOSIS — I10 Essential (primary) hypertension: Secondary | ICD-10-CM

## 2018-08-12 MED ORDER — MONTELUKAST SODIUM 10 MG PO TABS: 10.0000 mg | ORAL_TABLET | Freq: Every day | ORAL | 3 refills | Status: DC

## 2018-08-12 MED ORDER — FLUTICASONE PROPIONATE 50 MCG/ACT NA SUSP: 1.0000 | Freq: Every day | NASAL | 2 refills | Status: DC

## 2018-08-12 MED ORDER — ALBUTEROL SULFATE (2.5 MG/3ML) 0.083% IN NEBU: 2.5000 mg | INHALATION_SOLUTION | Freq: Four times a day (QID) | RESPIRATORY_TRACT | 1 refills | Status: DC | PRN

## 2018-08-12 MED ORDER — APIXABAN 5 MG PO TABS: 5.0000 mg | ORAL_TABLET | Freq: Two times a day (BID) | ORAL | 3 refills | Status: DC

## 2018-08-12 MED ORDER — METOPROLOL TARTRATE 50 MG PO TABS: 50.0000 mg | ORAL_TABLET | Freq: Two times a day (BID) | ORAL | 3 refills | Status: DC

## 2018-08-12 MED ORDER — GABAPENTIN 300 MG PO CAPS: 600.0000 mg | ORAL_CAPSULE | Freq: Two times a day (BID) | ORAL | 3 refills | Status: DC

## 2018-08-12 MED ORDER — ALBUTEROL SULFATE HFA 108 (90 BASE) MCG/ACT IN AERS: 2.0000 | INHALATION_SPRAY | RESPIRATORY_TRACT | 3 refills | Status: DC | PRN

## 2018-08-12 MED ORDER — OMEPRAZOLE 20 MG PO CPDR: 20.0000 mg | DELAYED_RELEASE_CAPSULE | Freq: Every day | ORAL | 3 refills | Status: DC

## 2018-08-12 MED ORDER — DULOXETINE HCL 60 MG PO CPEP: 60.0000 mg | ORAL_CAPSULE | Freq: Every day | ORAL | 3 refills | Status: DC

## 2018-08-12 NOTE — Progress Notes (Addendum)
Virtual Visit via Telephone Note  I connected with April [name] on 08/12/18 at 10:50 AM EDT by telephone and verified that I am speaking with the correct person using two identifiers.   I discussed the limitations, risks, security and privacy concerns of performing an evaluation and management service by telephone and the availability of in person appointments. I also discussed with the patient that there may be a patient responsible charge related to this service. The patient expressed understanding and agreed to proceed.   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 from 08/02/18 - 08/04/18 for acute respiratory distress secondary to asthma/COPD exacerbation and common cold virus.   She had presented with worsening dyspnea which was treated with nebulizer treatments, IV steroids and azithromycin.  Respiratory viral panel was positive for the common coronavirus OC 43 (Not COVID-19), influenza panel was negative.  Chest x-ray revealed mild cardiomegaly without pulmonary edema. Hydralazine was added for optimal blood pressure control. She was subsequently discharged on a prednisone taper.  She reports doing well with no worsening shortness of breath but does have residual nasal congestion.  Completed her course of azithromycin and is tolerating her prednisone taper at this time.  She has not been to see her pulmonologist Dr. Annamaria Boots in a while. Denies chest pains, wheezing and her exercise tolerance is  good.  Observations/Objective: Alert, awake, oriented x3 No acute distress  Assessment and Plan: 1. Neuropathy Stable - DULoxetine (CYMBALTA) 60 MG capsule; Take 1 capsule (60 mg total) by mouth daily.  Dispense: 30 capsule; Refill: 3 - gabapentin (NEURONTIN) 300 MG capsule; Take 2 capsules (600 mg total) by mouth 2 (two) times daily.  Dispense: 120 capsule; Refill: 3  2. Moderate persistent asthma with acute exacerbation Hospitalized for recent exacerbation by coronavirus C43(common cold) Currently on prednisone taper with improvement Advised to schedule appointment with her pulmonologist-Dr. Annamaria Boots - albuterol (PROVENTIL) (2.5 MG/3ML) 0.083% nebulizer solution; Take 3 mLs (2.5 mg total) by nebulization every 6 (six) hours as needed for wheezing or shortness of breath.  Dispense: 75 mL; Refill: 1 - albuterol (PROVENTIL HFA;VENTOLIN HFA) 108 (90 Base) MCG/ACT inhaler; Inhale 2 puffs into the lungs every 4 (four) hours as needed for wheezing or shortness of breath.  Dispense: 1 Inhaler; Refill: 3 - montelukast (SINGULAIR) 10 MG tablet; Take 1 tablet (10 mg total) by mouth at bedtime.  Dispense: 30 tablet; Refill: 3  3. AF (paroxysmal atrial fibrillation) (HCC) Rate control with metoprolol and anticoagulation with Eliquis - apixaban (ELIQUIS) 5 MG TABS tablet; Take 1 tablet (5 mg total) by mouth 2 (two) times daily.  Dispense: 60 tablet; Refill: 3  4. Chronic diastolic CHF (congestive heart failure) ([place]) He EF of 60 to 65% from 04/2018 Asymptomatic Continue Lasix  5. Essential hypertension Advised to keep a check of blood pressure Continue hydralazine, metoprolol - metoprolol tartrate (LOPRESSOR) 50 MG tablet; Take 1 tablet (50 mg total) by mouth 2 (two) times daily.  Dispense: 60 tablet; Refill: 3  6. Gastroesophageal reflux disease without esophagitis Controlled - omeprazole (PRILOSEC) 20 MG capsule; Take 1 capsule (20 mg total) by mouth daily.  Dispense:  30 capsule; Refill: 3  I  have personally reviewed her medications with her and ensured she has enough refills.  Follow Up Instructions: Return in about 3 months (around 11/11/2018) for Chronic medical conditions, call for an appointment.    I discussed the assessment and treatment plan with the patient. The patient was provided an opportunity to ask questions and all were answered. The patient agreed with the plan and demonstrated an understanding of the instructions.   The patient was advised to call back or seek an in-person evaluation if the symptoms worsen or if the condition fails to improve as anticipated.  I provided 15 minutes of non-face-to-face time during this encounter.   [name], MD

## 2018-08-12 NOTE — Addendum Note (Signed)
Addended by: [name] on: 08/12/2018 12:05 PM   Modules accepted: Level of Service

## 2018-08-12 NOTE — Progress Notes (Signed)
Patient has been called and DOB has been verified. Patient has been screened and transferred to PCP to start phone visit.  C/C: COPD  REFILLS: LASIX

## 2018-08-13 IMAGING — CR DG CHEST 2V
2 series · 2 of 2 positions shown · non-contrast
Comparison: Chest radiograph August 07, 2017

CLINICAL DATA: Chest pain and shortness of breath for 1 day.
History of COPD and pneumonia.

EXAM:
CHEST - 2 VIEW

[chest pa]
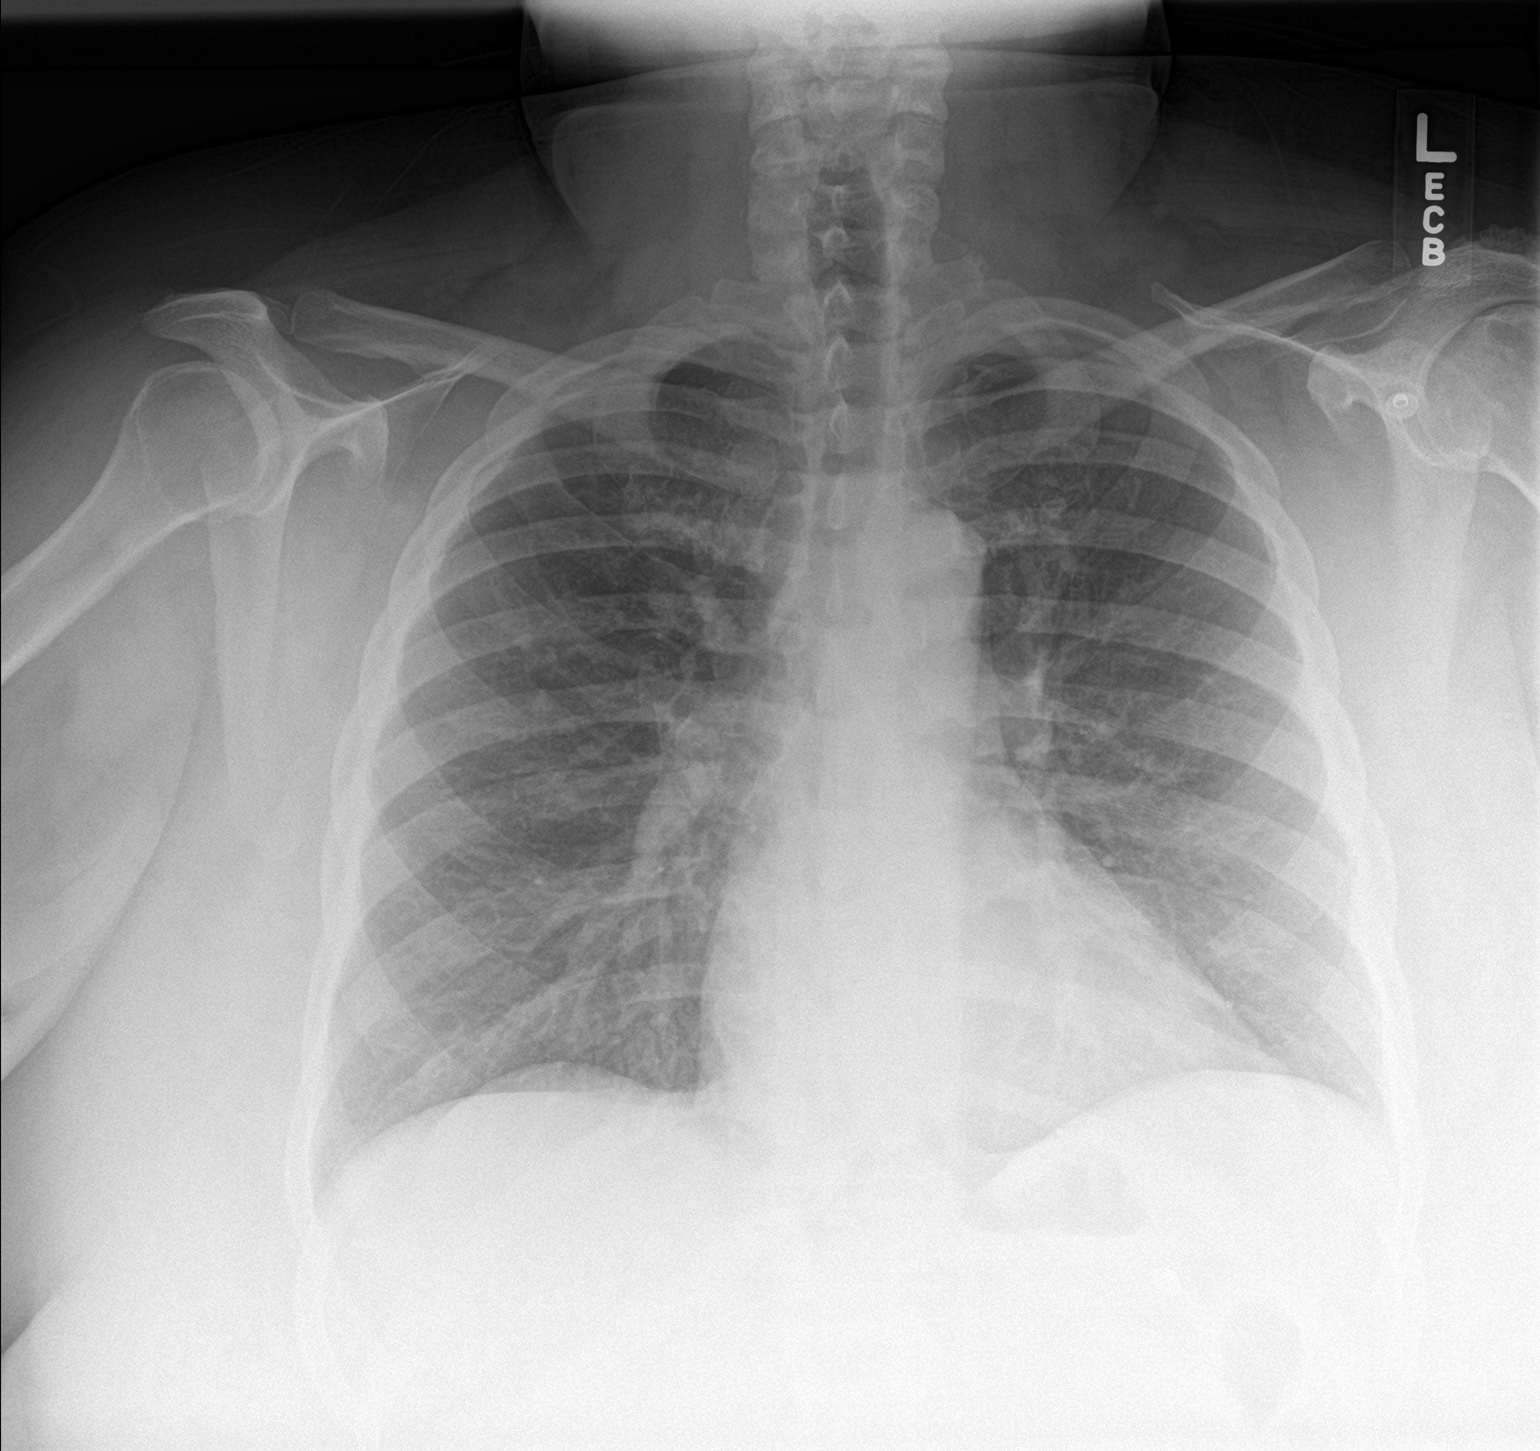

[chest lat]
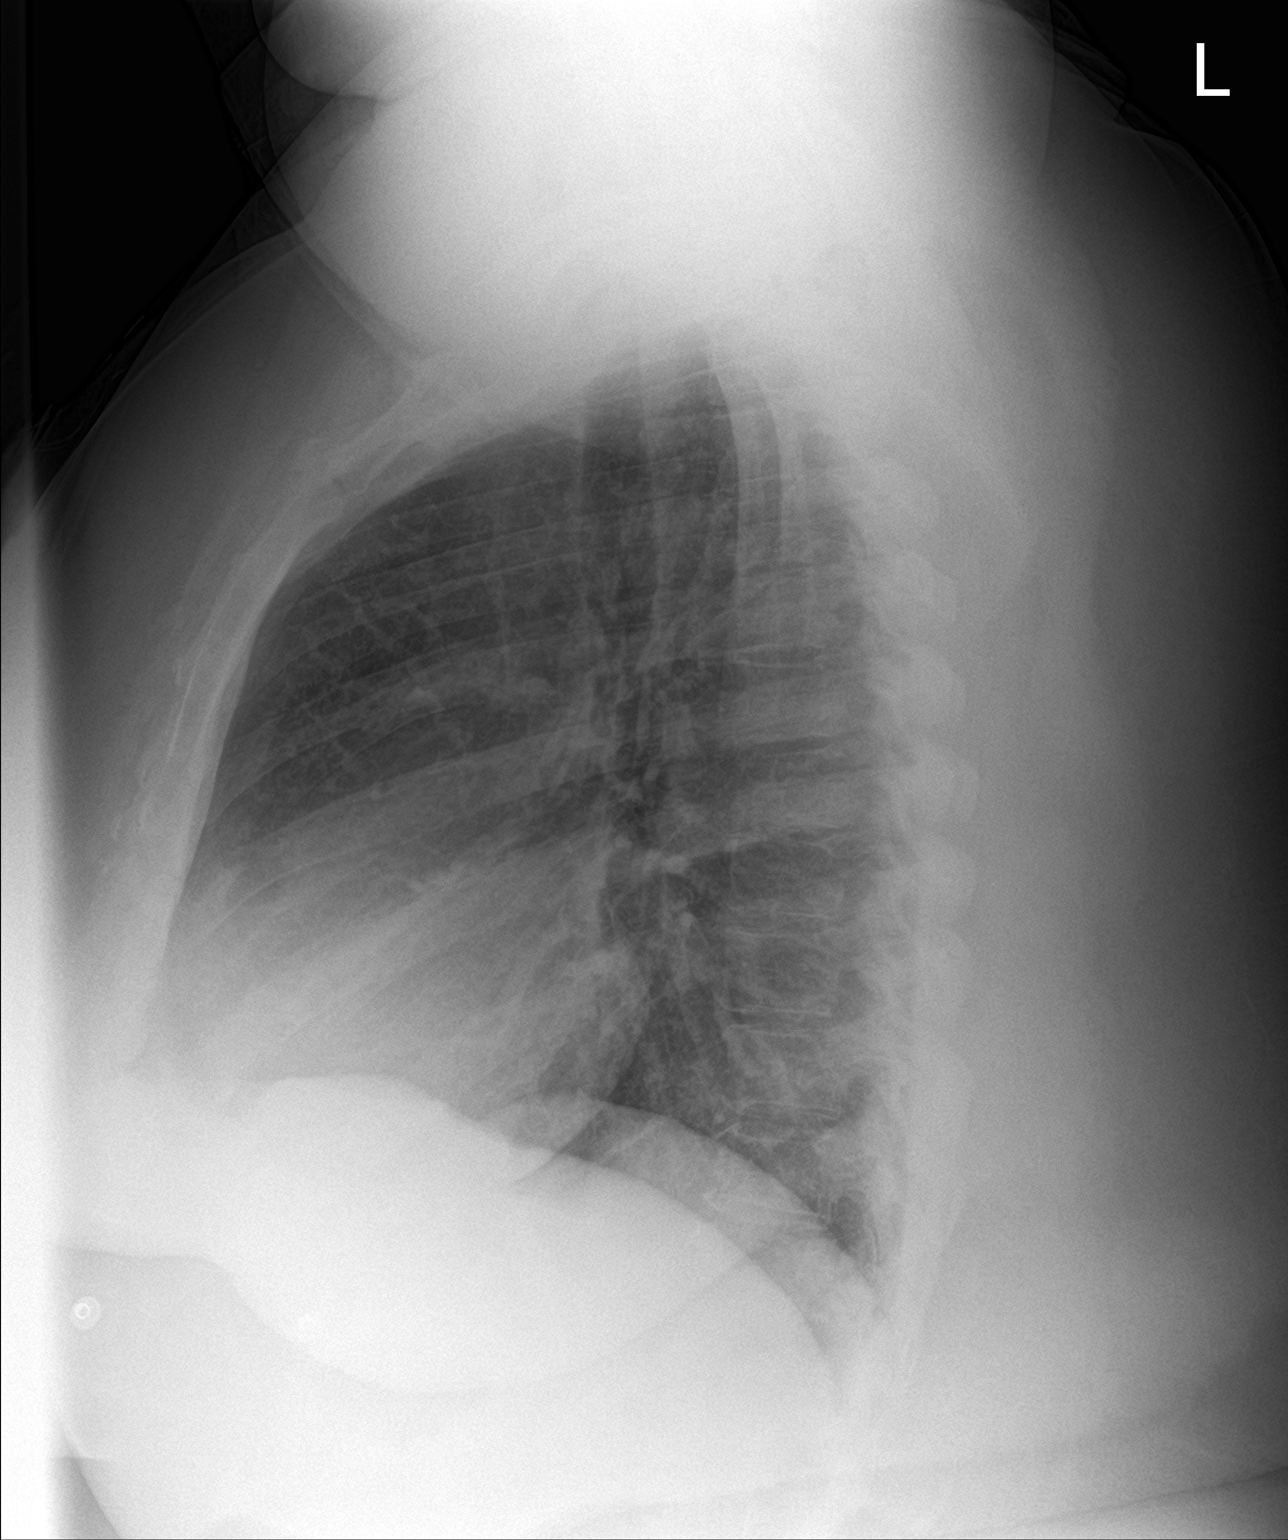

[2 of 2 positions shown; findings below may reference images not displayed]

FINDINGS: Cardiomediastinal silhouette is normal. No pleural effusions or
focal consolidations. Bronchitic changes. Trachea projects midline
and there is no pneumothorax. Soft tissue planes and included
osseous structures are non-suspicious. Large body habitus.
IMPRESSION: Bronchitic changes without focal consolidation.

## 2018-08-19 ENCOUNTER — Inpatient Hospital Stay: Payer: BLUE CROSS/BLUE SHIELD | Admitting: Family Medicine

## 2018-08-21 IMAGING — DX DG CHEST 1V PORT
1 series · 1 of 1 positions shown · non-contrast
Comparison: 07/28/2017.

CLINICAL DATA: Shortness of breath.

EXAM:
PORTABLE CHEST 1 VIEW

[chest ap]
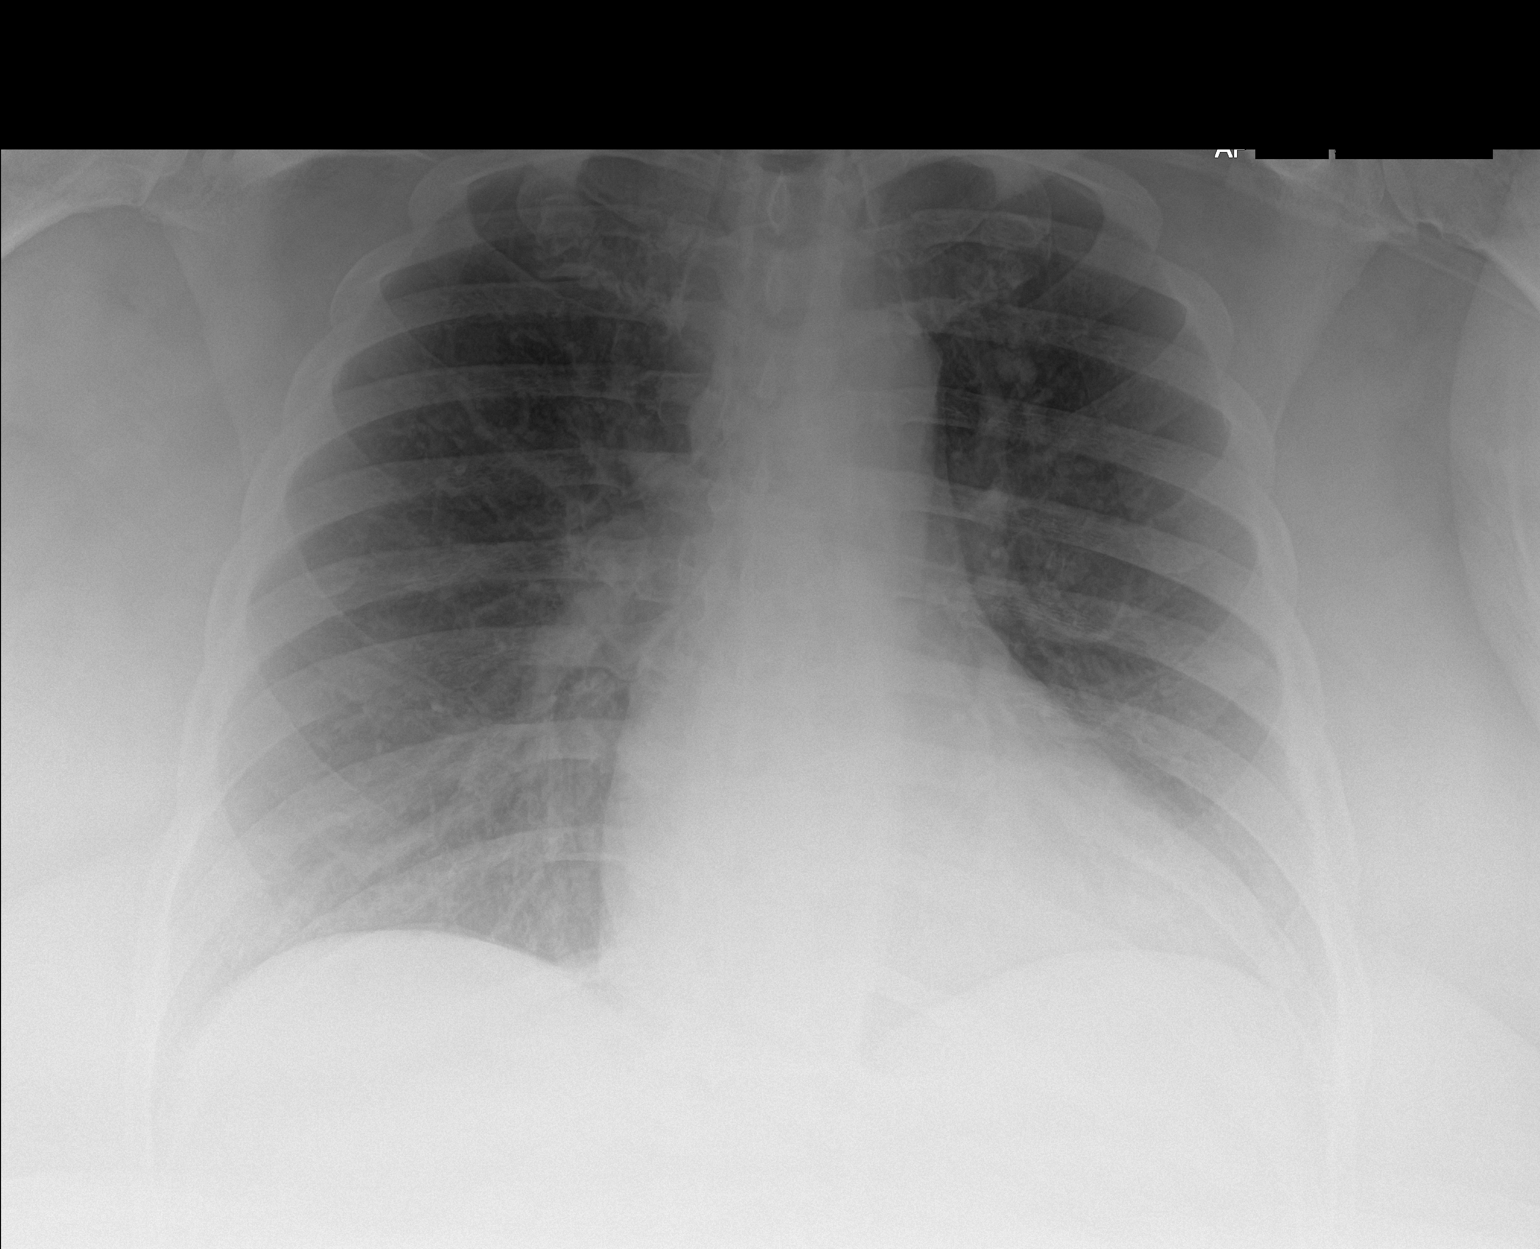

[1 of 1 positions shown; findings below may reference images not displayed]

FINDINGS: Mediastinum and hilar structures are normal. Lungs are clear. No
pleural effusion or pneumothorax. No acute bony abnormality.
IMPRESSION: No acute cardiac pulmonary disease.

## 2018-08-22 IMAGING — CR DG CHEST 2V
2 series · 2 of 2 positions shown · non-contrast
Comparison: 08/05/2017, 05/26/2017

CLINICAL DATA: Cough fever and chest pain

EXAM:
CHEST - 2 VIEW

[w chest lat]
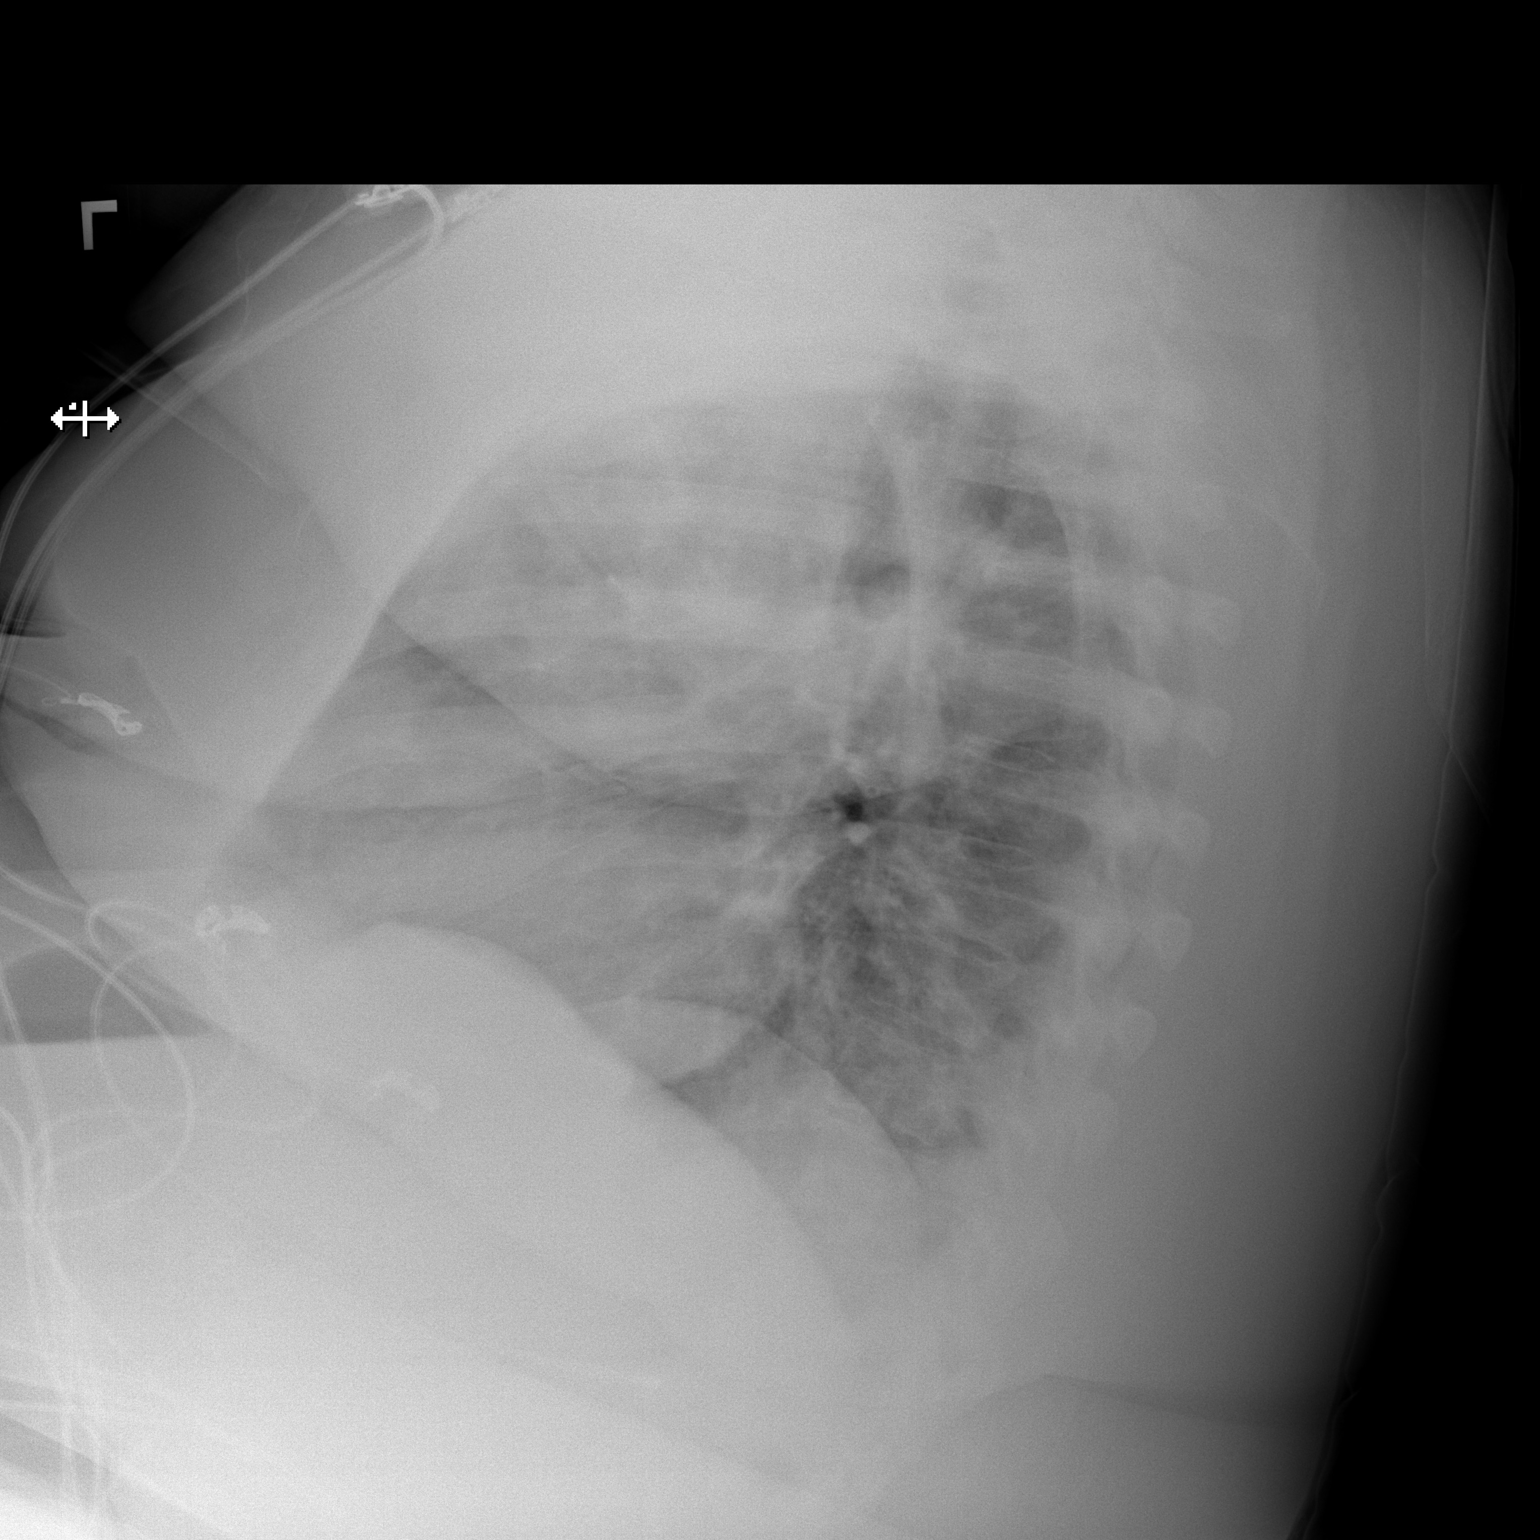

[x chest ap]
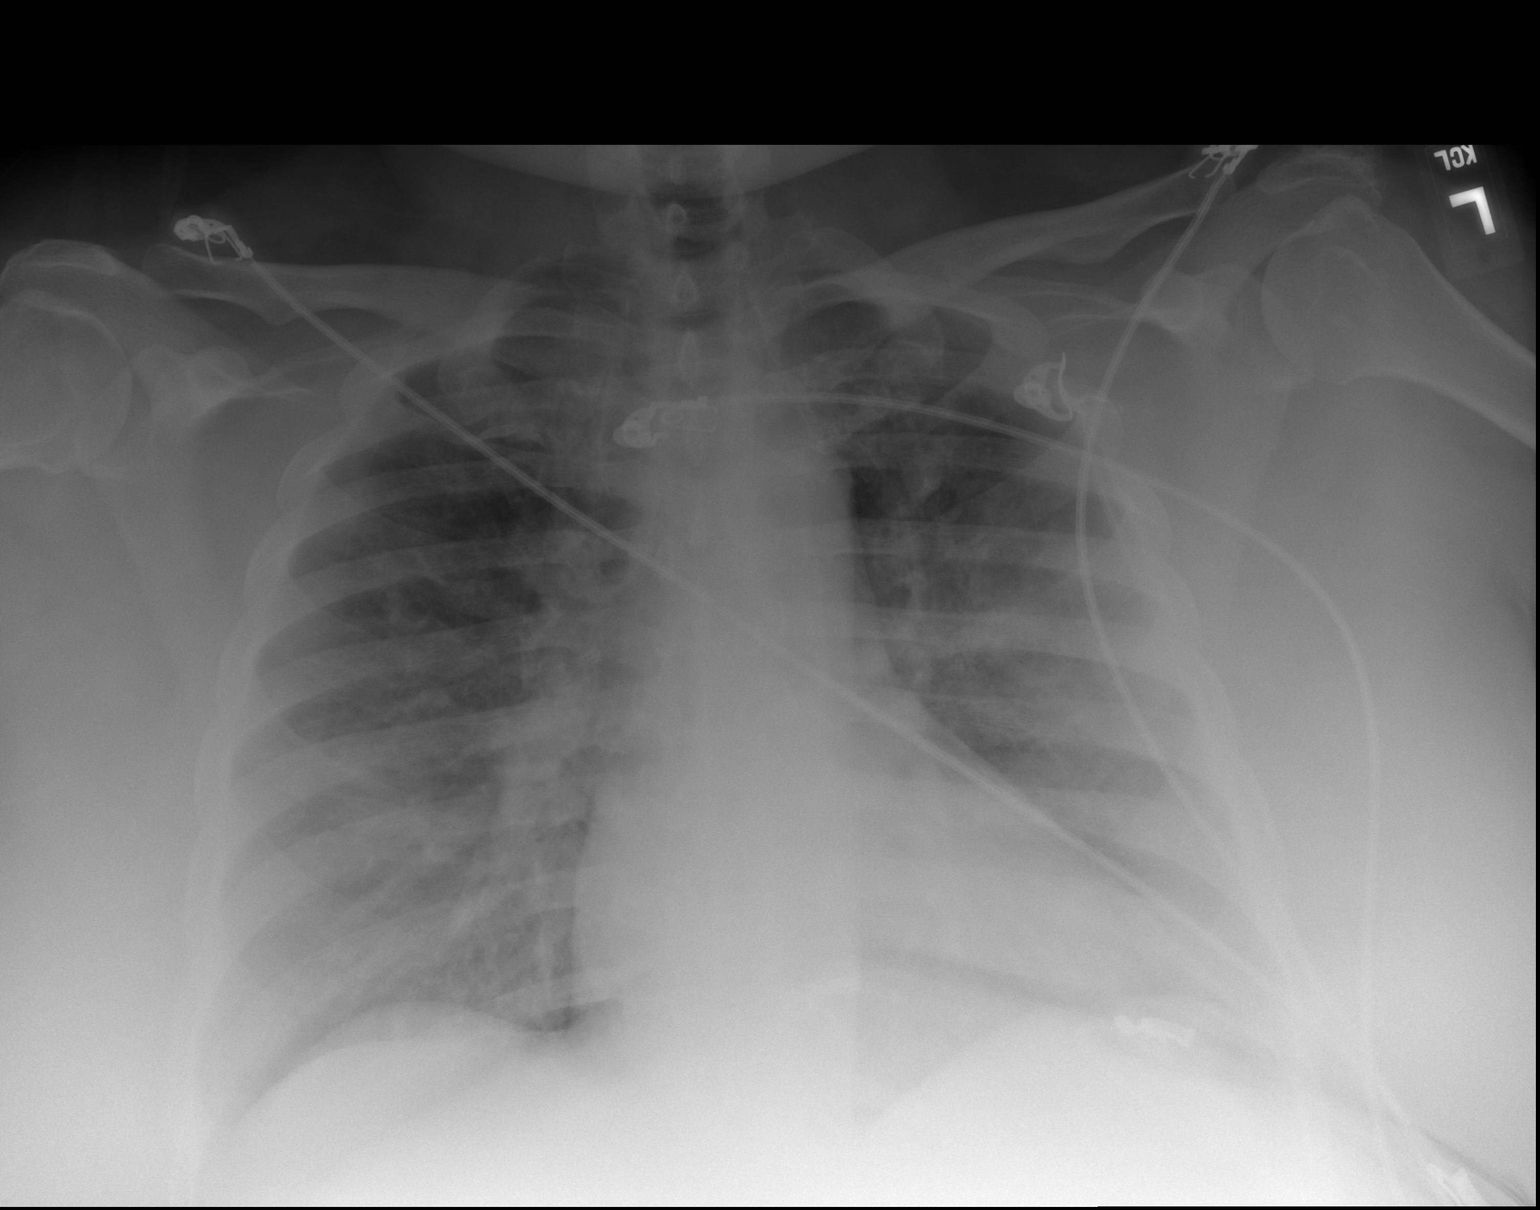

[2 of 2 positions shown; findings below may reference images not displayed]

FINDINGS: Mild diffuse bilateral ground-glass opacity most notable in the left
upper lung and right base. No pleural effusion. Stable
cardiomediastinal silhouette. No pneumothorax.
IMPRESSION: Suspected subtle ground-glass opacity within the left upper and
right lower lungs, possible pneumonia.

## 2018-08-24 IMAGING — DX DG CHEST 2V
2 series · 2 of 2 positions shown · non-contrast
Comparison: 08/06/2017

CLINICAL DATA: Acute onset shortness of breath today. COPD. Former
smoker.

EXAM:
CHEST - 2 VIEW

[chest pa]
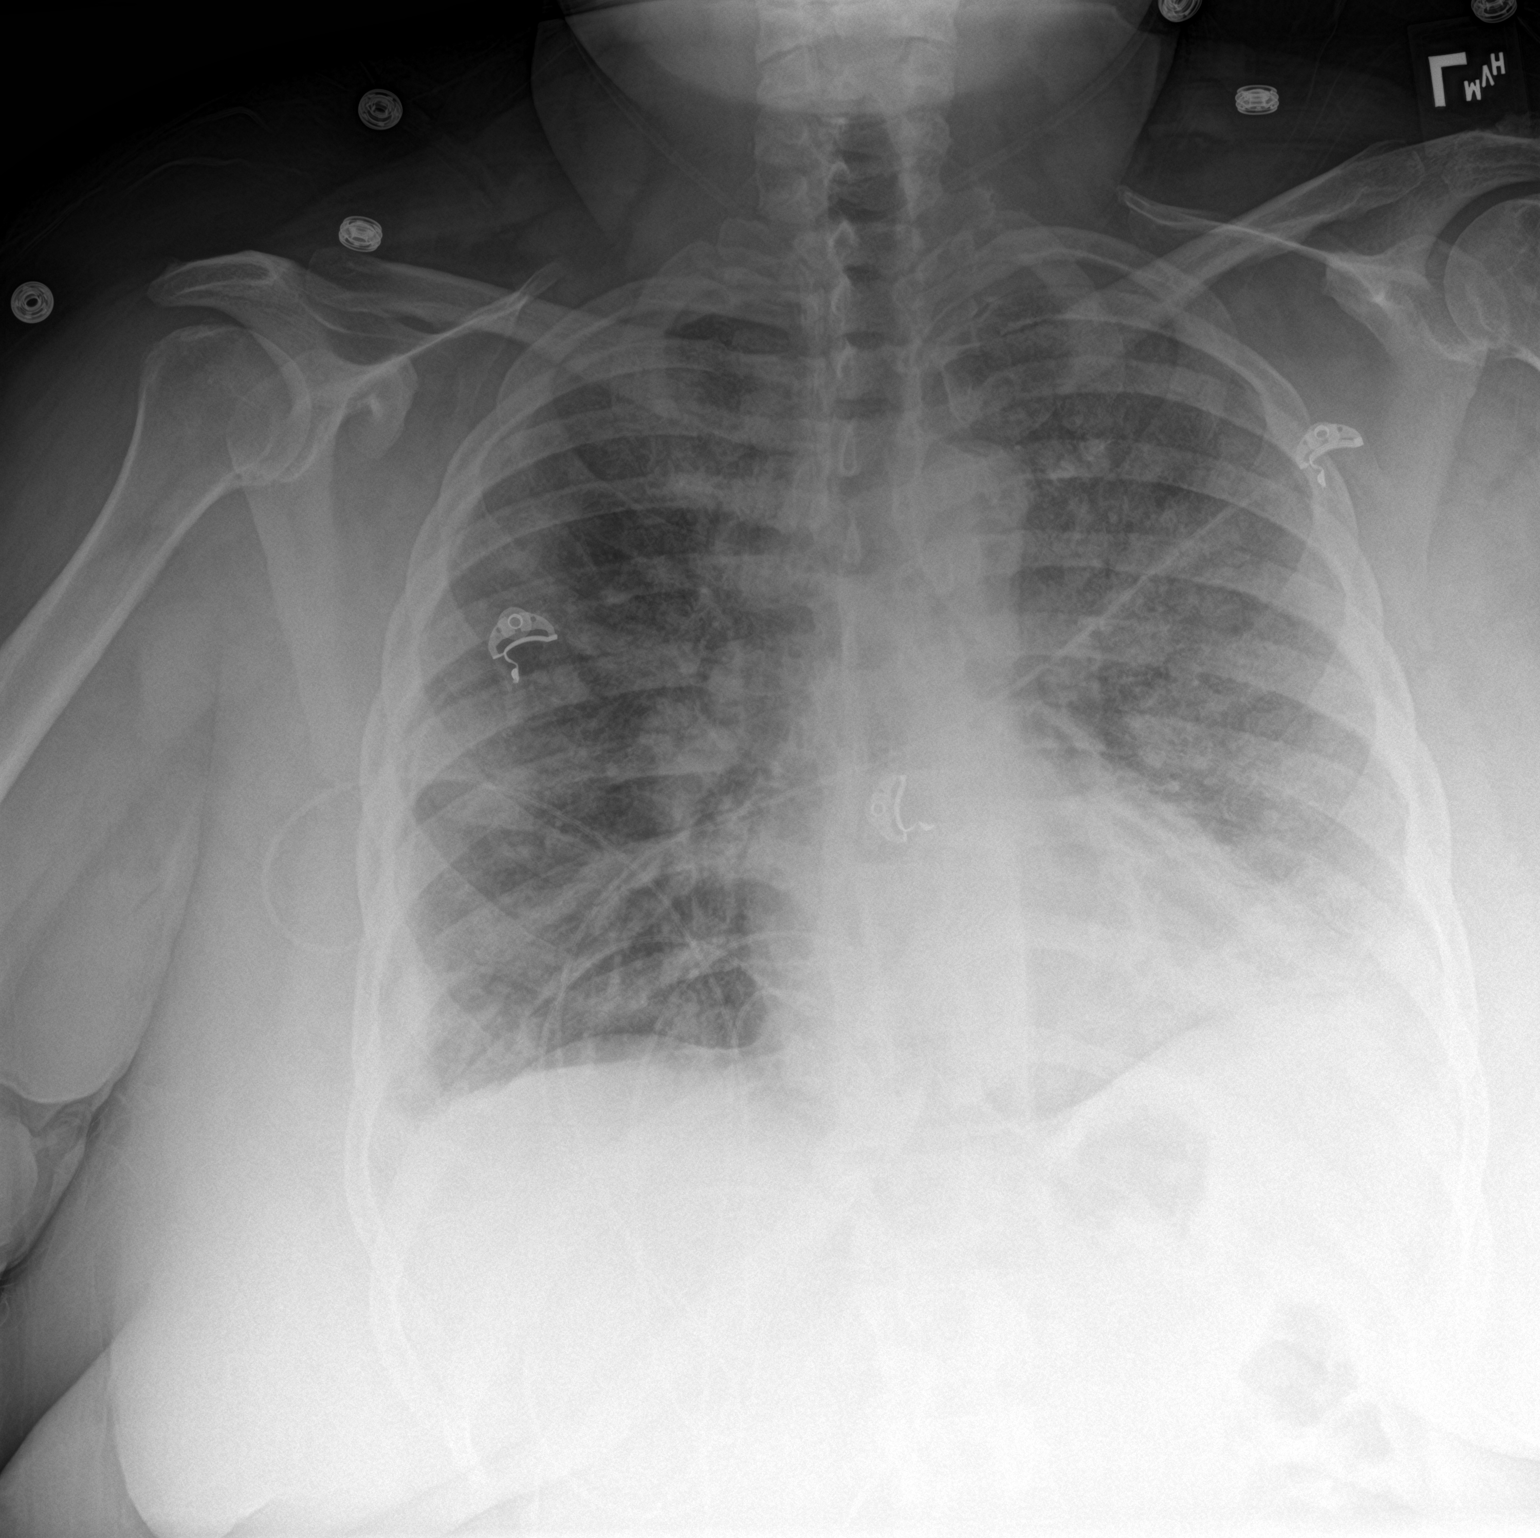

[chest lat]
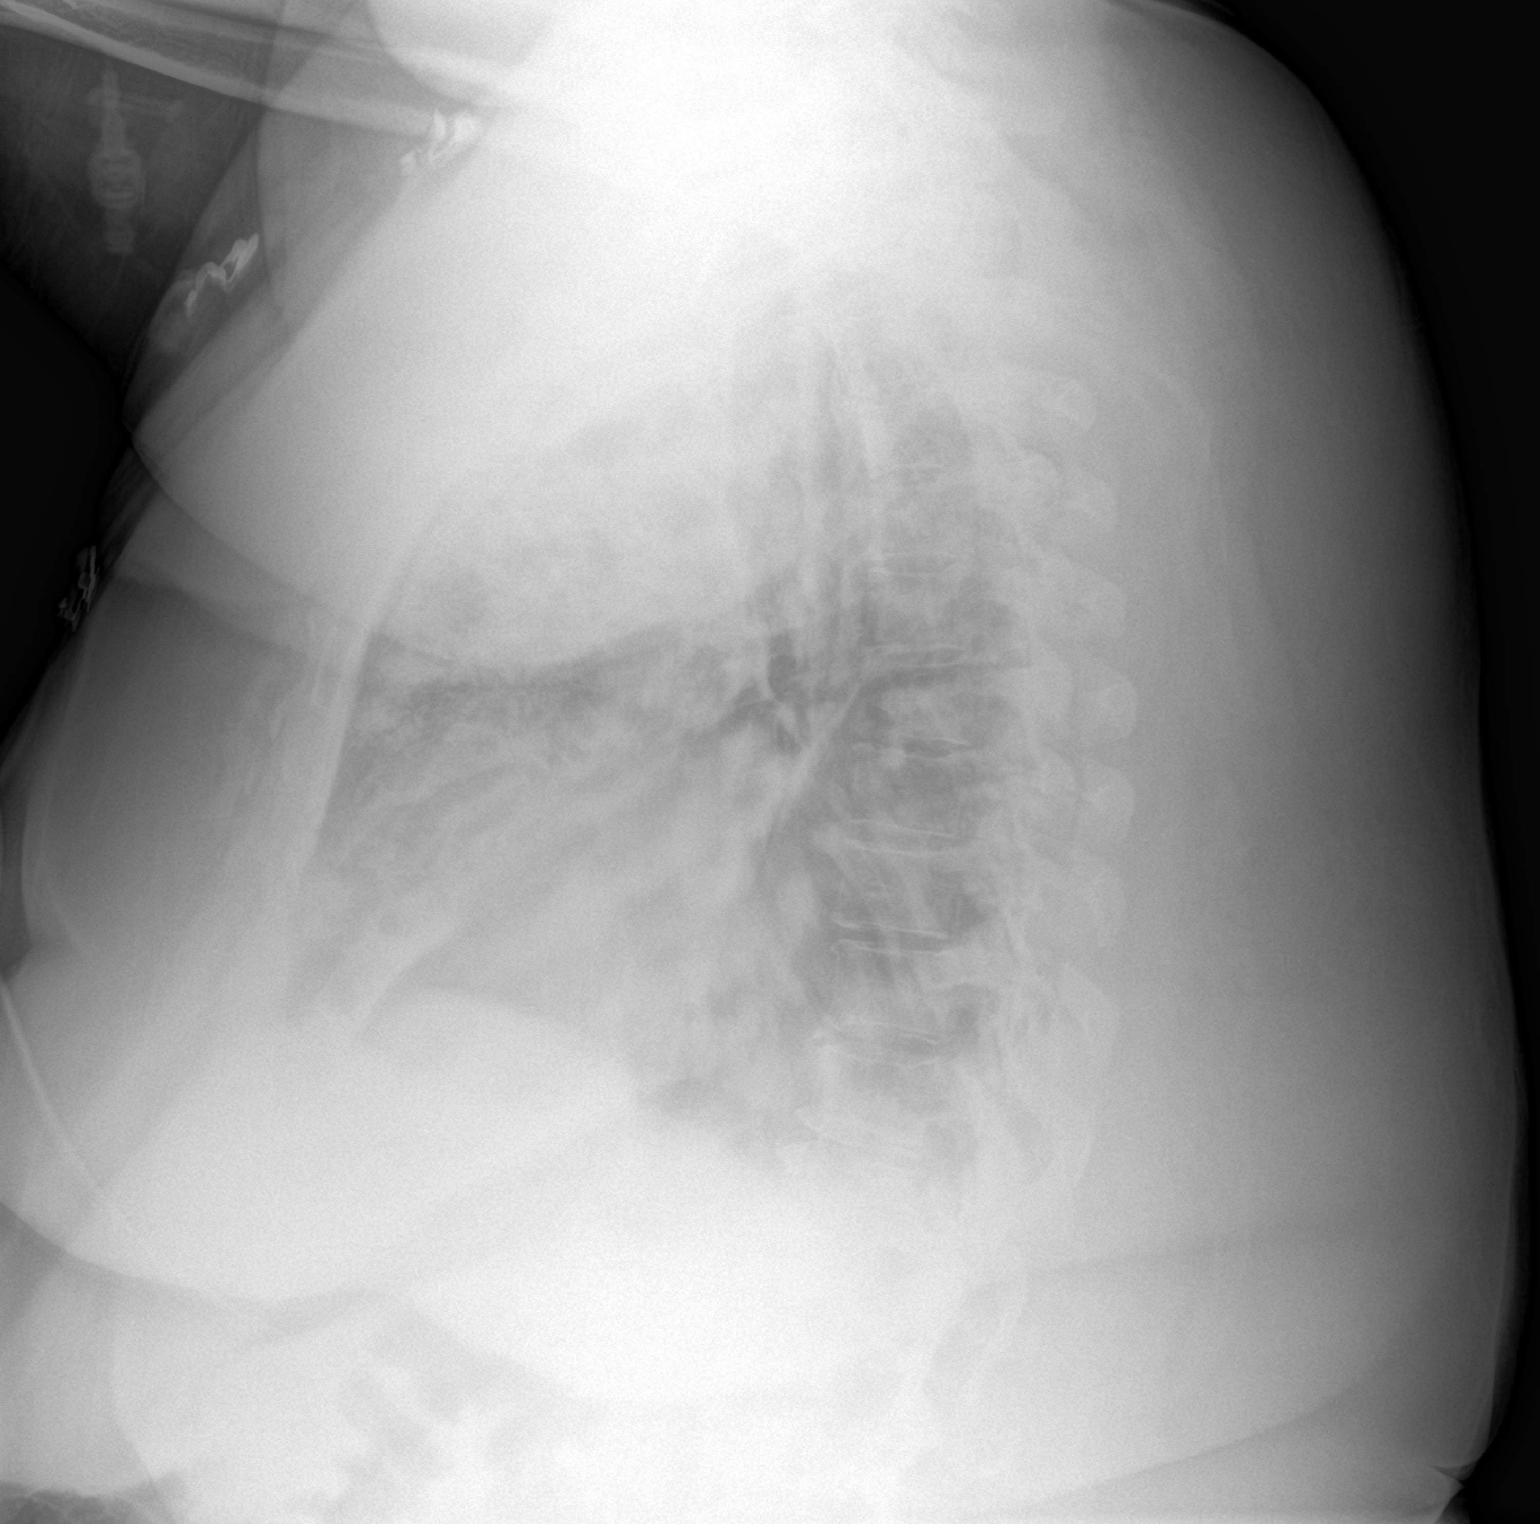

[2 of 2 positions shown; findings below may reference images not displayed]

FINDINGS: Heart size remains normal. Increased diffuse bilateral pulmonary
airspace disease is seen, suspicious for pulmonary edema. Probable
small right pleural effusion.
IMPRESSION: Increased diffuse bilateral airspace disease, suspicious for
pulmonary edema, and small right pleural effusion.

## 2018-08-27 IMAGING — DX DG ABDOMEN 1V
1 series · 1 of 1 positions shown · non-contrast
Comparison: Radiograph March 29, 2017.

CLINICAL DATA: Orogastric tube placement.

EXAM:
ABDOMEN - 1 VIEW

[abdomen kub]
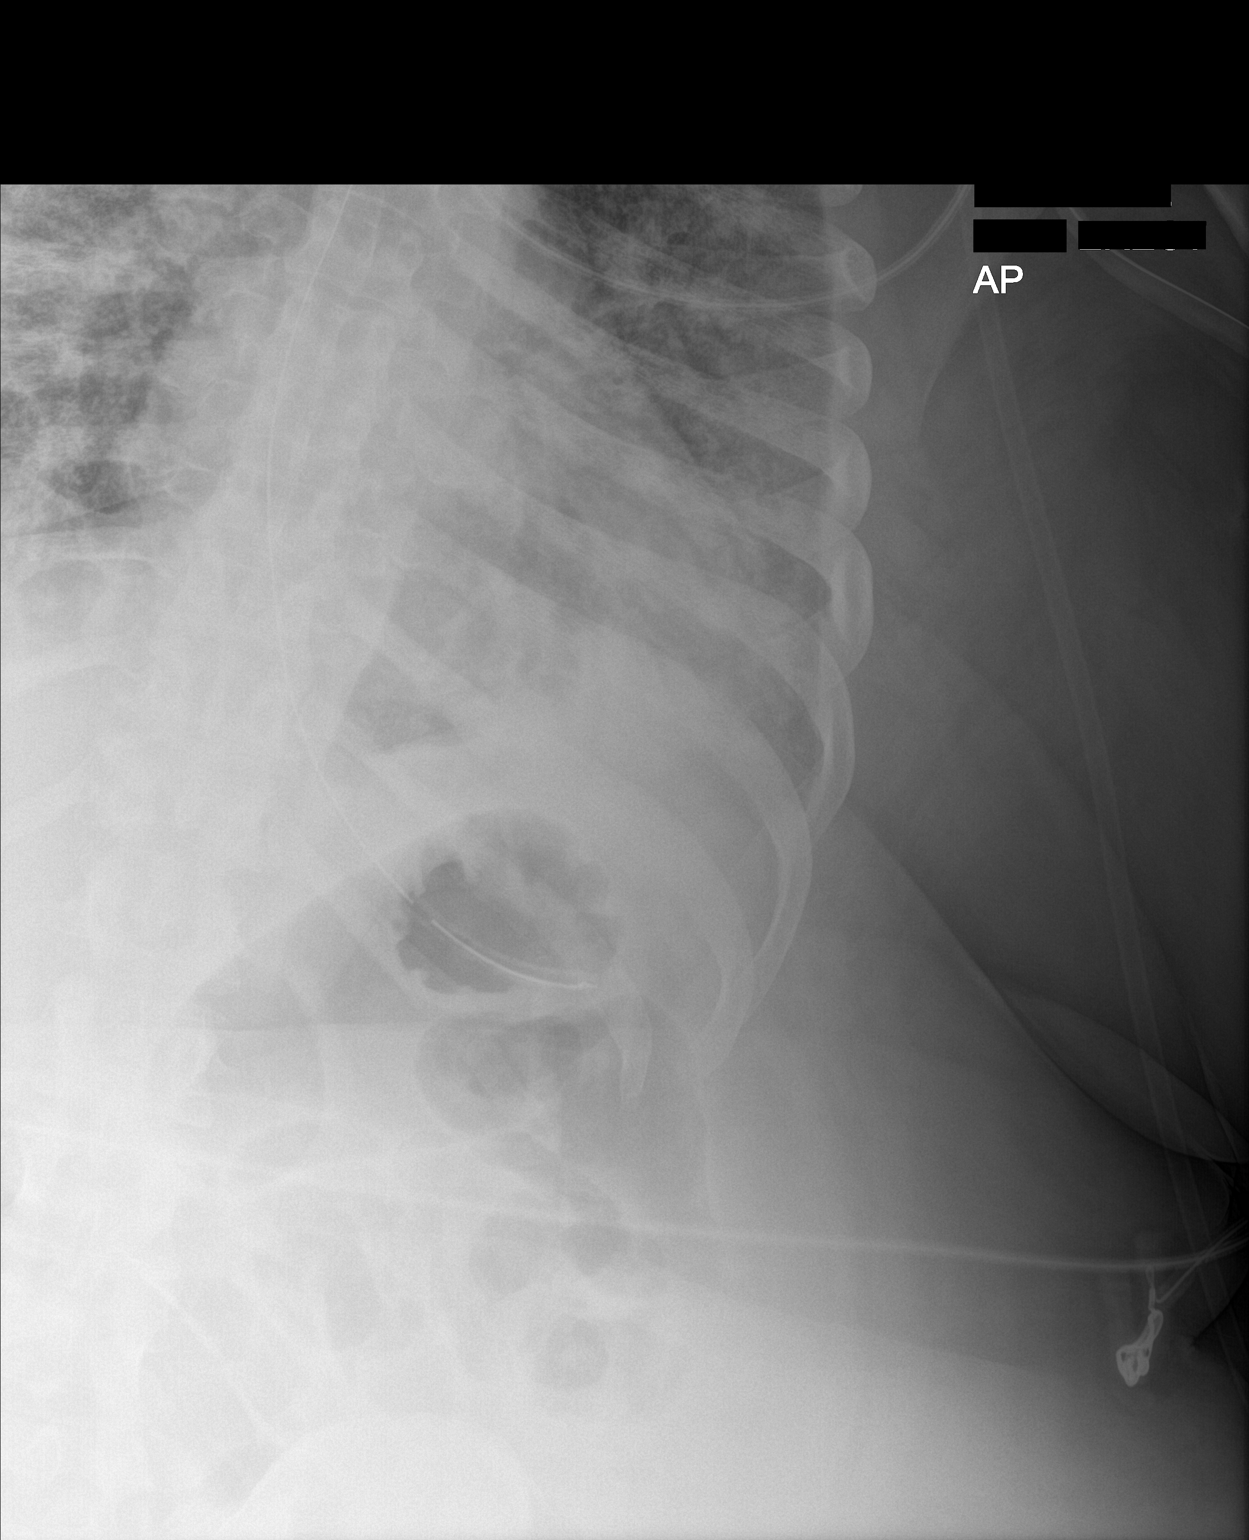

[1 of 1 positions shown; findings below may reference images not displayed]

FINDINGS: Distal tip of orogastric tube is seen in proximal stomach. No
abnormal bowel dilatation is noted.
IMPRESSION: Distal tip of orogastric tube seen in proximal stomach.

## 2018-08-27 IMAGING — DX DG CHEST 1V PORT
1 series · 1 of 1 positions shown · non-contrast
Comparison: Chest radiograph performed 08/08/2017

CLINICAL DATA: Endotracheal tube placement.

EXAM:
PORTABLE CHEST 1 VIEW

[chest ap]
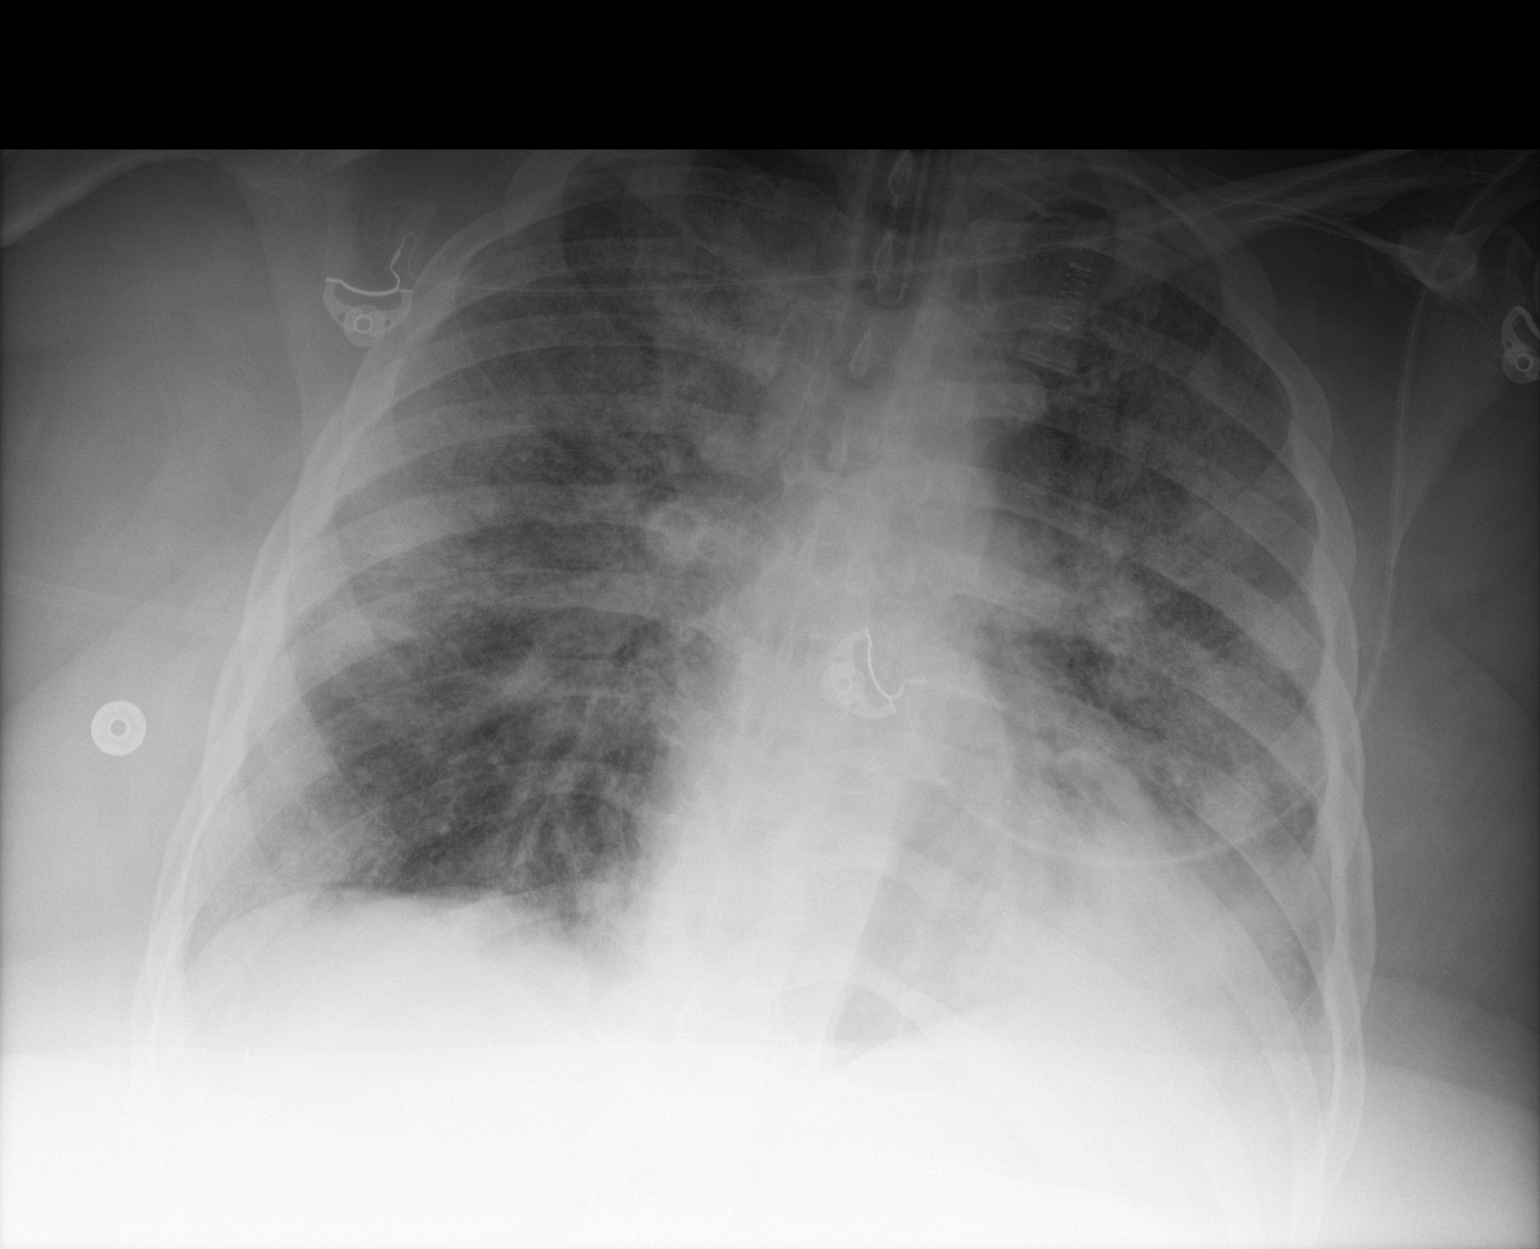

[1 of 1 positions shown; findings below may reference images not displayed]

FINDINGS: The patient's endotracheal tube is seen ending 5 cm above the
carina.

Vascular congestion is noted. Increased interstitial markings raise
concern for pulmonary edema. Retrocardiac airspace opacity is
somewhat more prominent, and pneumonia cannot be excluded. No
significant pleural effusion or pneumothorax is seen.

The cardiomediastinal silhouette is borderline normal in size. No
acute osseous abnormalities are identified.
IMPRESSION: 1. Endotracheal tube seen ending 5 cm above the carina.
2. Vascular congestion. Increased interstitial markings raise
concern for pulmonary edema.
3. Increased prominence of retrocardiac airspace opacity. Pneumonia
cannot be excluded.

## 2018-08-27 IMAGING — DX DG CHEST 1V PORT
1 series · 1 of 1 positions shown · non-contrast
Comparison: Single-view of the chest earlier today.

CLINICAL DATA: Status post repositioning of endotracheal tube
today.

EXAM:
PORTABLE CHEST 1 VIEW

[chest ap]
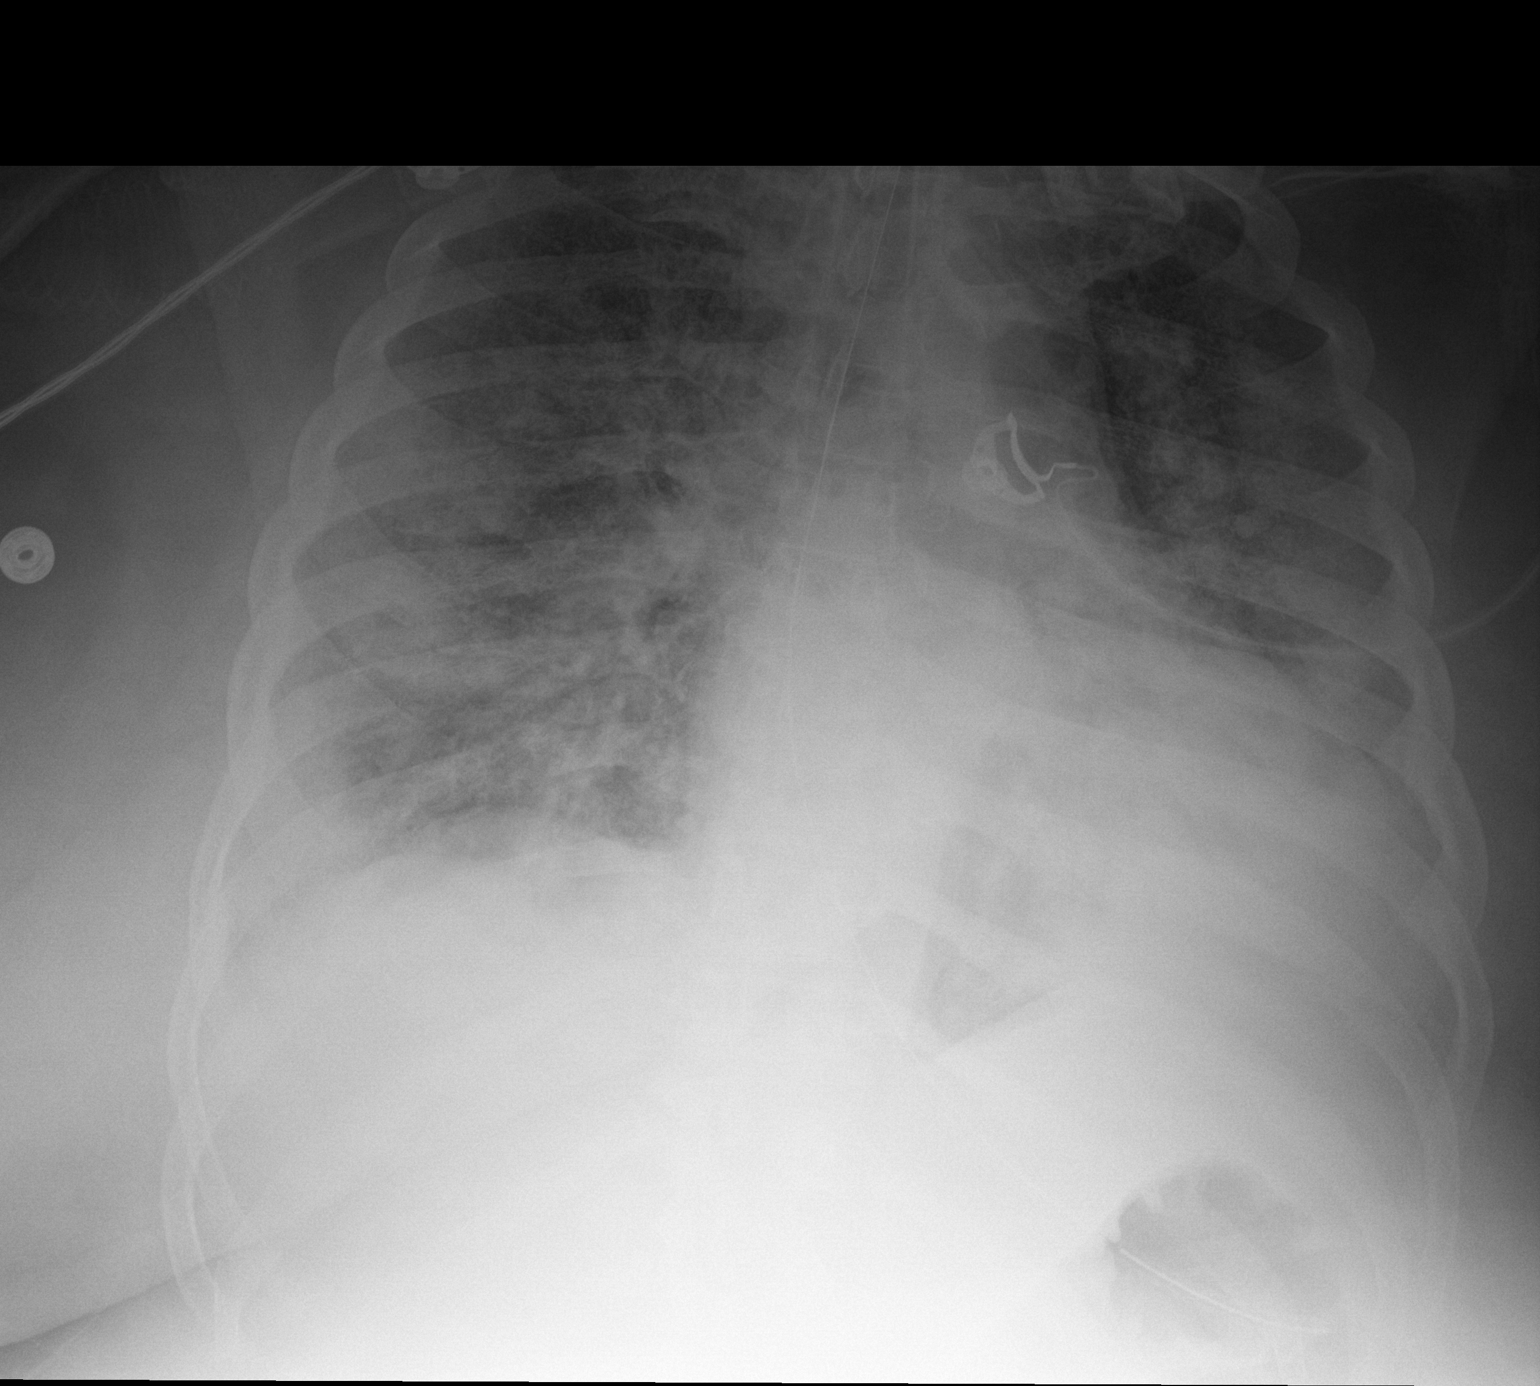

[1 of 1 positions shown; findings below may reference images not displayed]

FINDINGS: The patient's endotracheal tube has been advanced and is now
proximal 3 cm above the carina in good position. OG tube tip is in
the stomach. Diffuse bilateral airspace disease persists. Aeration
in the right lung base has worsened. Heart size is normal. No
pneumothorax or pleural effusion.
IMPRESSION: ET tube is in good position with the tip 3 cm above the carina. OG
tube tip is in the stomach.

Extensive bilateral airspace disease persists. Aeration in the right
lung base has worsened.

## 2018-08-28 IMAGING — DX DG CHEST 1V PORT
1 series · 1 of 1 positions shown · non-contrast
Comparison: 08/11/2017

CLINICAL DATA: Acute respiratory failure with hypoxia

EXAM:
PORTABLE CHEST 1 VIEW

[chest ap]
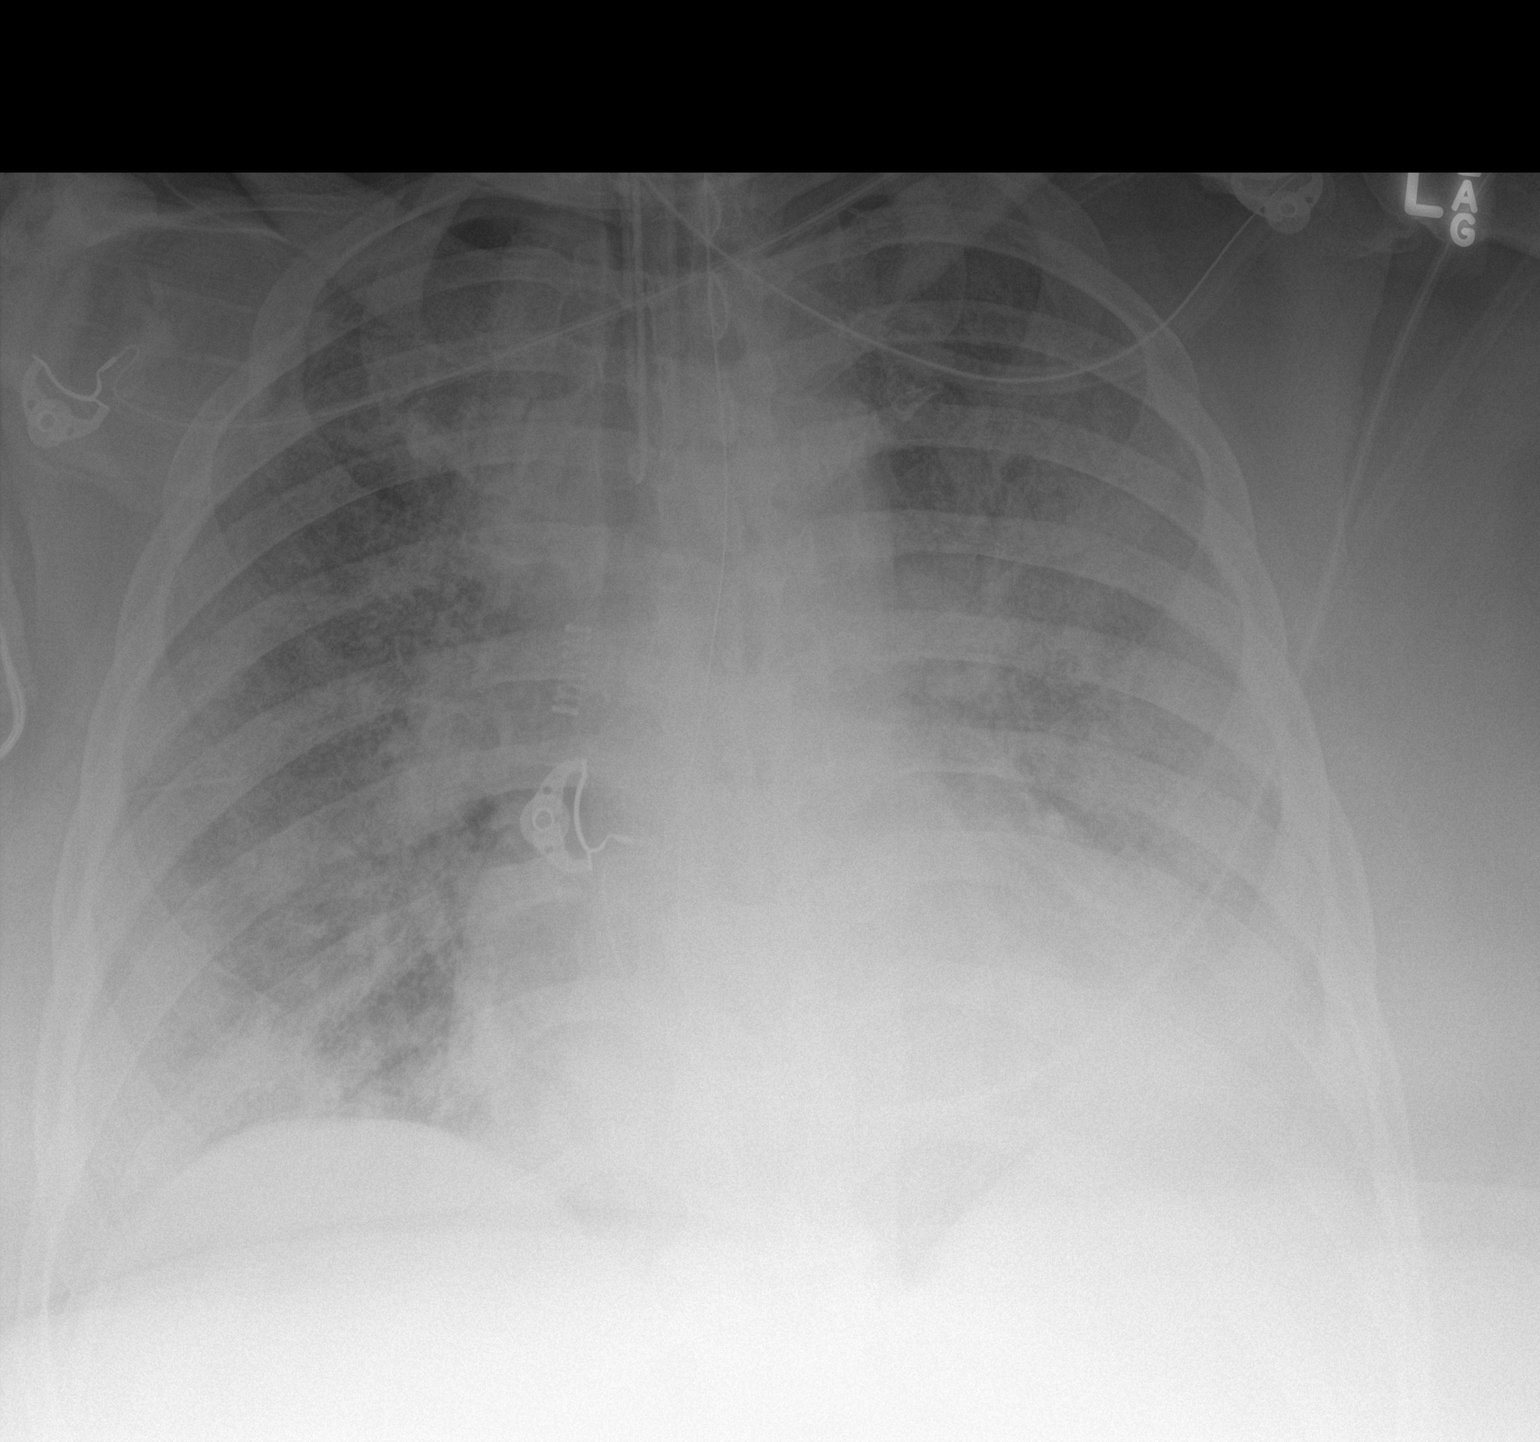

[1 of 1 positions shown; findings below may reference images not displayed]

FINDINGS: Endotracheal tube in good position.  NG tube in the stomach.

Severe diffuse bilateral airspace disease unchanged. No pneumothorax
or effusion
IMPRESSION: Endotracheal tube in good position. Severe diffuse bilateral
airspace disease unchanged.

## 2018-08-29 IMAGING — DX DG CHEST 1V PORT
1 series · 1 of 1 positions shown · non-contrast
Comparison: 08/12/2017

CLINICAL DATA: Respiratory failure

EXAM:
PORTABLE CHEST 1 VIEW

[chest ap]
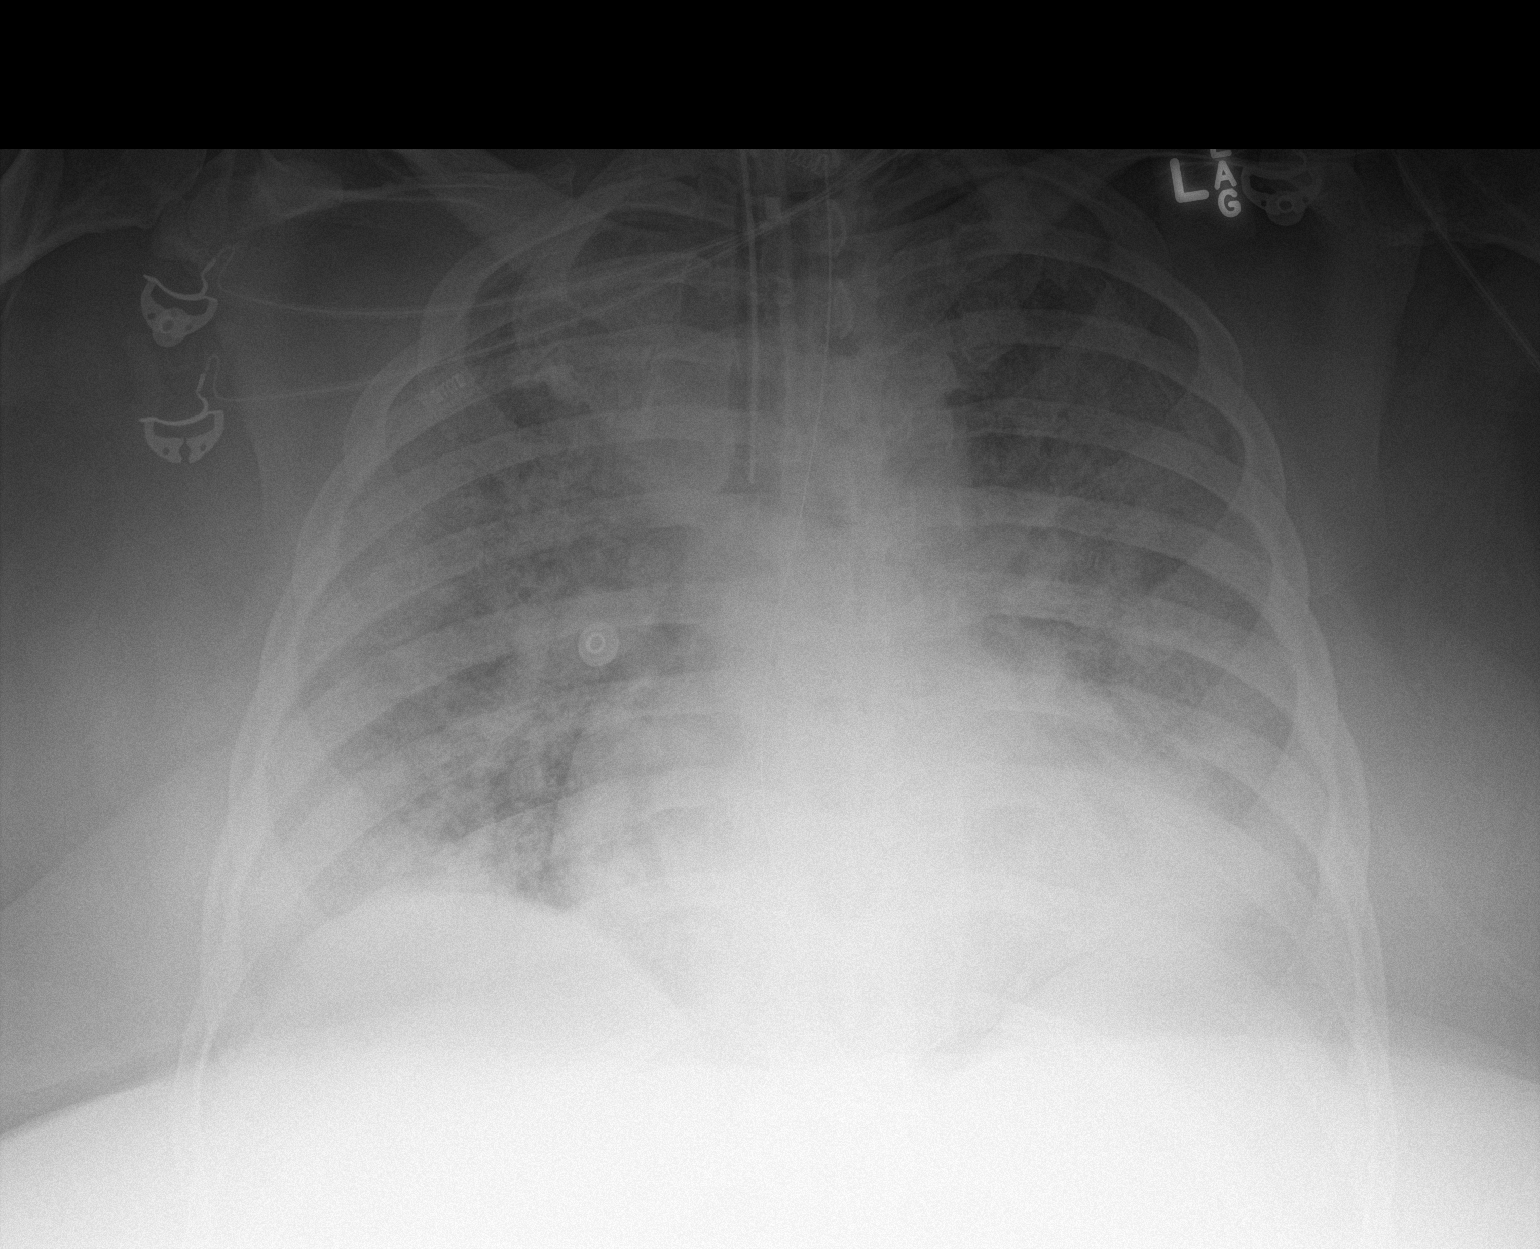

[1 of 1 positions shown; findings below may reference images not displayed]

FINDINGS: Cardiac shadow is stable. Endotracheal tube and nasogastric catheter
are again seen and stable. Persistent consolidation in the medial
aspect of the right upper lobe is noted. Bilateral airspace
opacities are again seen and stable. No sizable effusion is noted.
No bony abnormality is seen.
IMPRESSION: Stable appearance when compared with the prior exam.

## 2018-08-29 IMAGING — DX DG CHEST 1V PORT
1 series · 1 of 1 positions shown · non-contrast
Comparison: 5241 hours today and earlier.

CLINICAL DATA: 46-year-old female central line placement.  ARDS.

EXAM:
PORTABLE CHEST 1 VIEW

[chest ap]
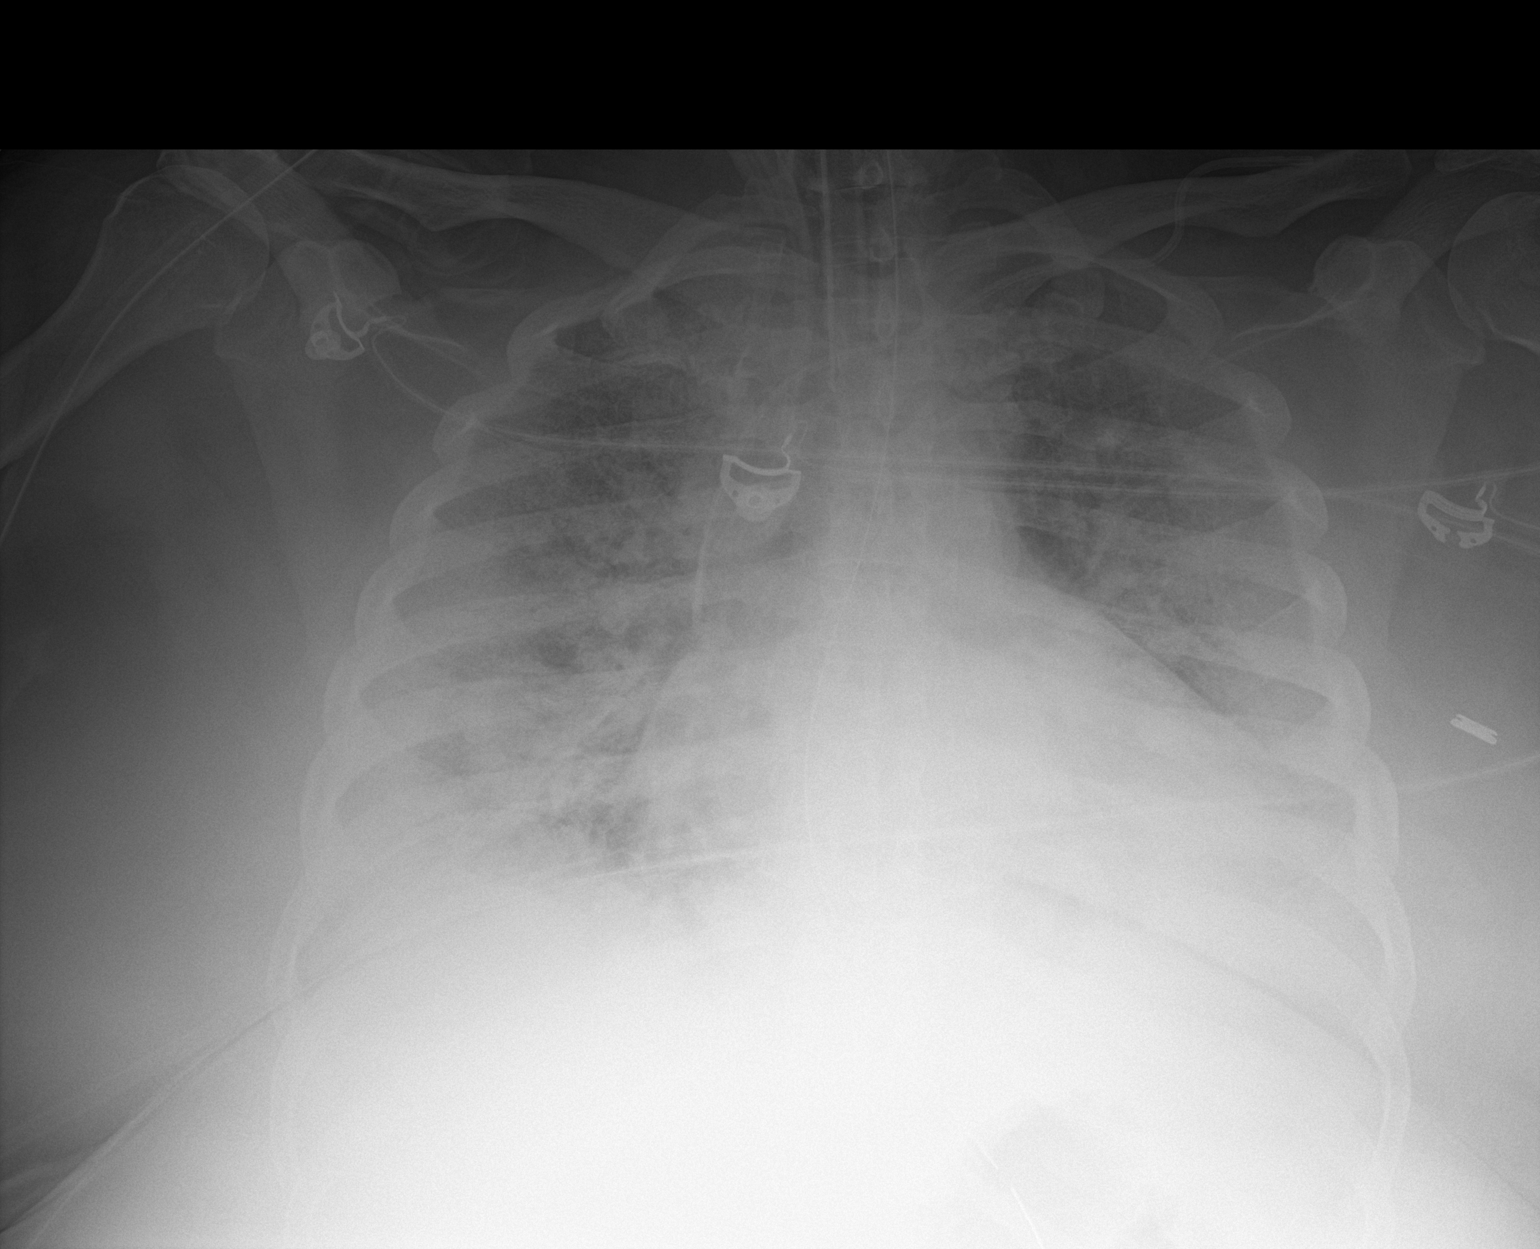

[1 of 1 positions shown; findings below may reference images not displayed]

FINDINGS: Portable AP semi upright view at 4908 hours. Left subclavian
approach central line placed with tip just below the level of the
carina at the SVC level. No pneumothorax.

Endotracheal tube tip is in good position between the clavicles and
carina. Enteric tube courses to the abdomen and side hole is at the
level of the proximal gastric body.

Stable cardiac size and mediastinal contours. Continued diffuse
bilateral pulmonary opacity. Stable lung volumes.
IMPRESSION: 1. Left subclavian central line placed with tip at the SVC level. No
adverse features.
2. ET tube and NG tube in good position.
3. Stable ventilation with continued diffuse bilateral pulmonary
opacity. This is compatible with ARDS.

## 2018-08-30 IMAGING — DX DG CHEST 1V PORT
1 series · 2 of 2 positions shown · non-contrast
Comparison: Radiograph August 13, 2017.

CLINICAL DATA: Acute respiratory failure with hypoxia.

EXAM:
PORTABLE CHEST 1 VIEW

[Series 1: chest ap · 0.14mm/px · 2 of 2 slices shown]
[im 1/2]
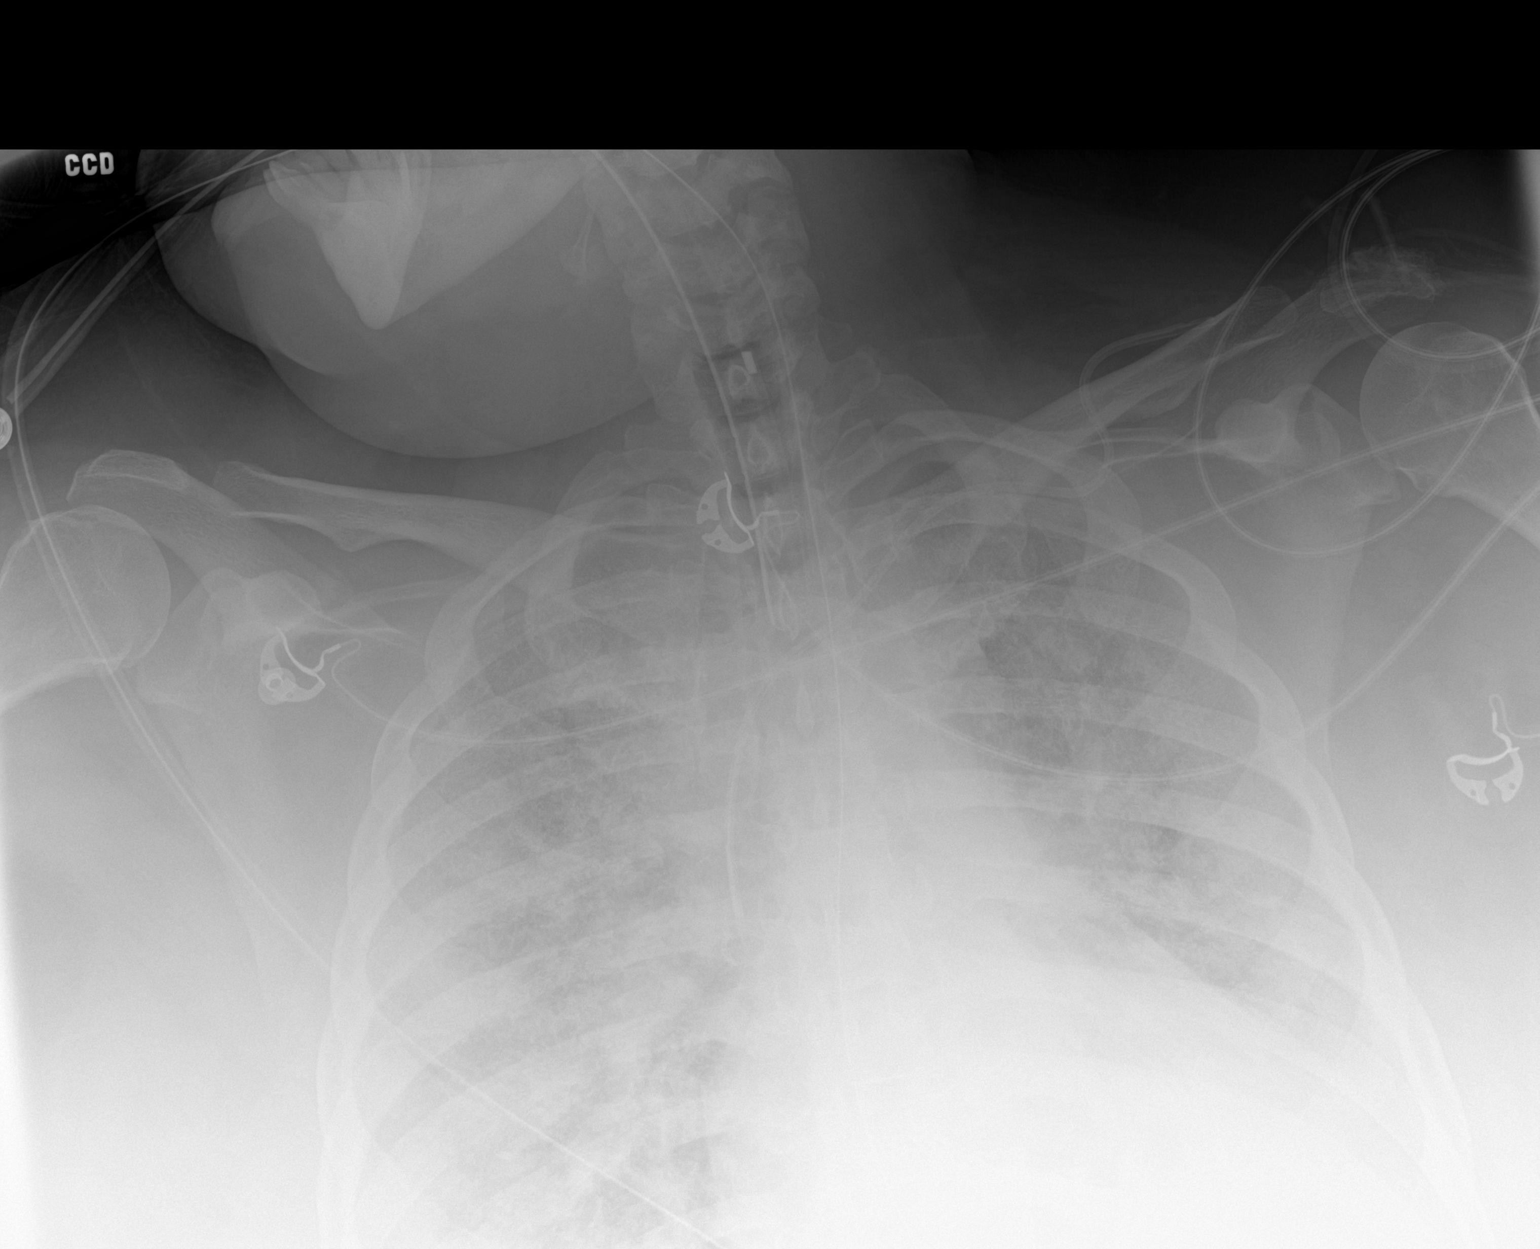
[im 2/2]
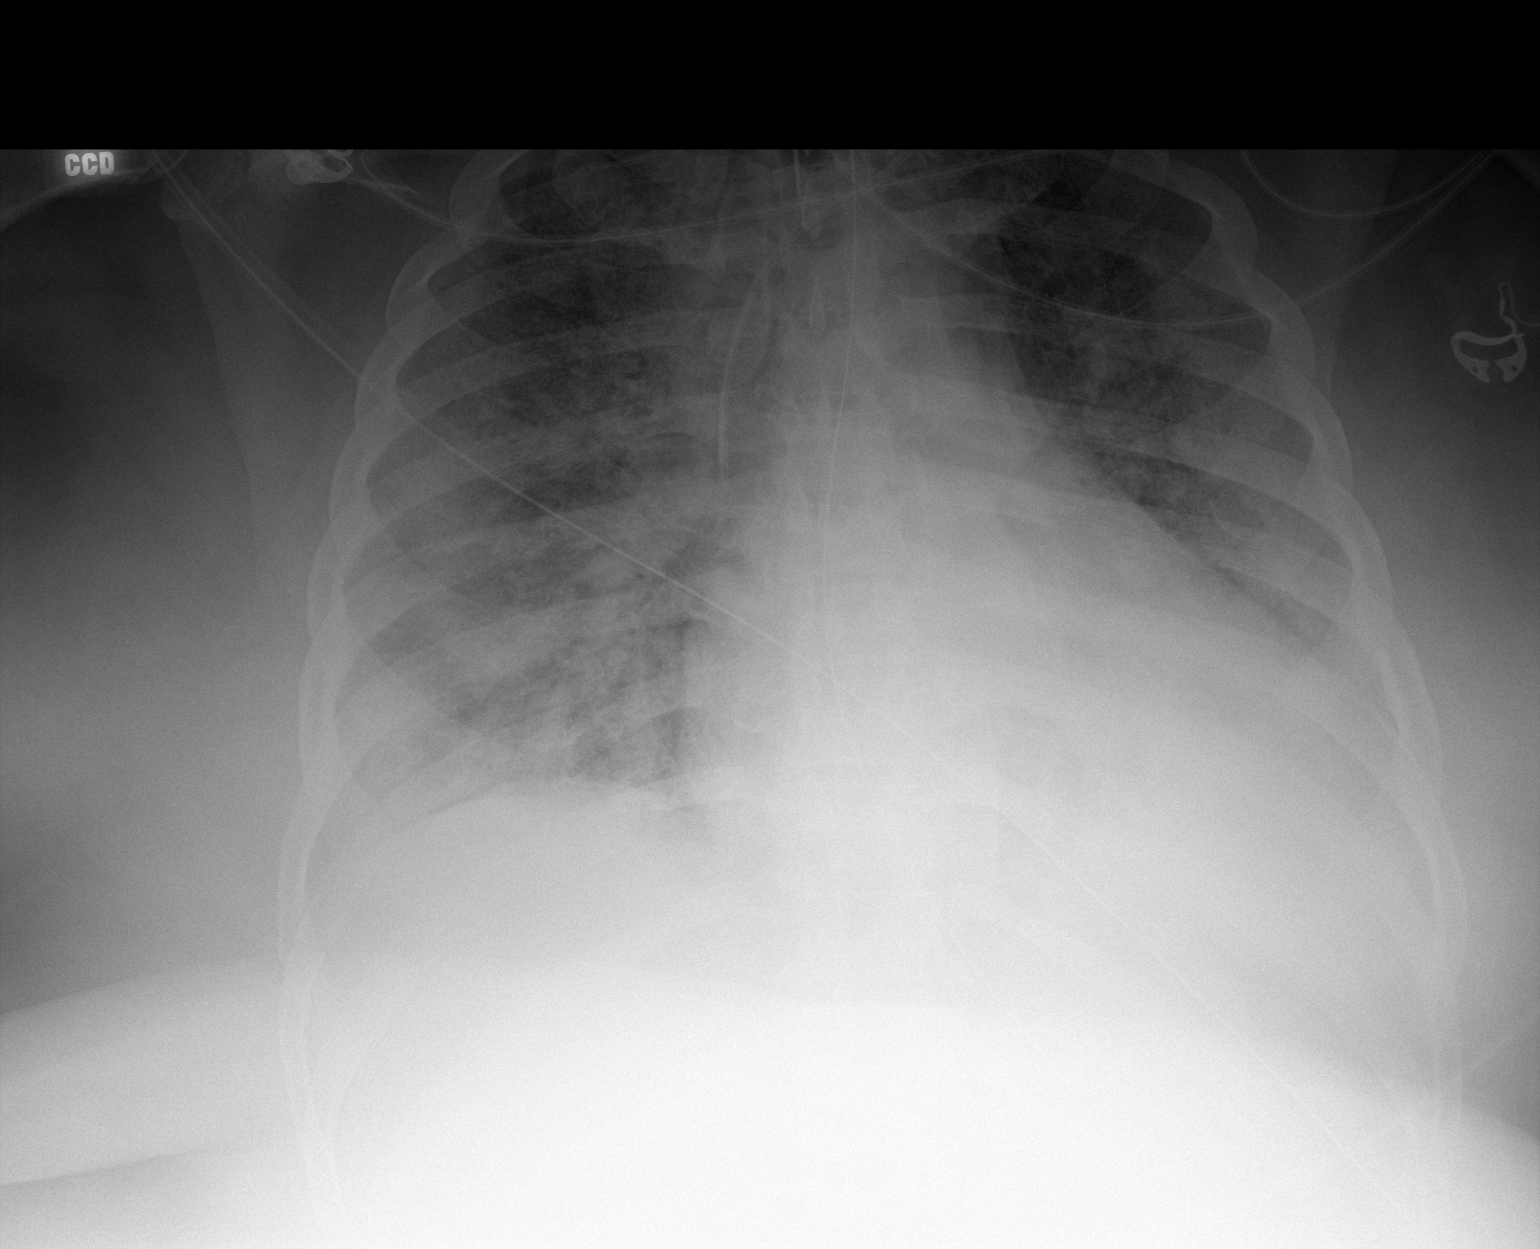

[2 of 2 positions shown; findings below may reference images not displayed]

FINDINGS: Stable cardiomegaly. Endotracheal and nasogastric tubes are
unchanged in position. Left subclavian catheter is unchanged in
position with distal tip in expected position of the SVC. Stable
bilateral diffuse lung opacities are noted concerning for edema or
inflammation or ARDS. Bony thorax is unremarkable.
IMPRESSION: Stable support apparatus. Stable bilateral lung opacities are noted
concerning for edema, pneumonia or ARDS.

## 2018-08-31 IMAGING — DX DG CHEST 1V PORT
1 series · 1 of 1 positions shown · non-contrast
Comparison: August 14, 2017

CLINICAL DATA: Hypoxia

EXAM:
PORTABLE CHEST 1 VIEW

[chest ap]
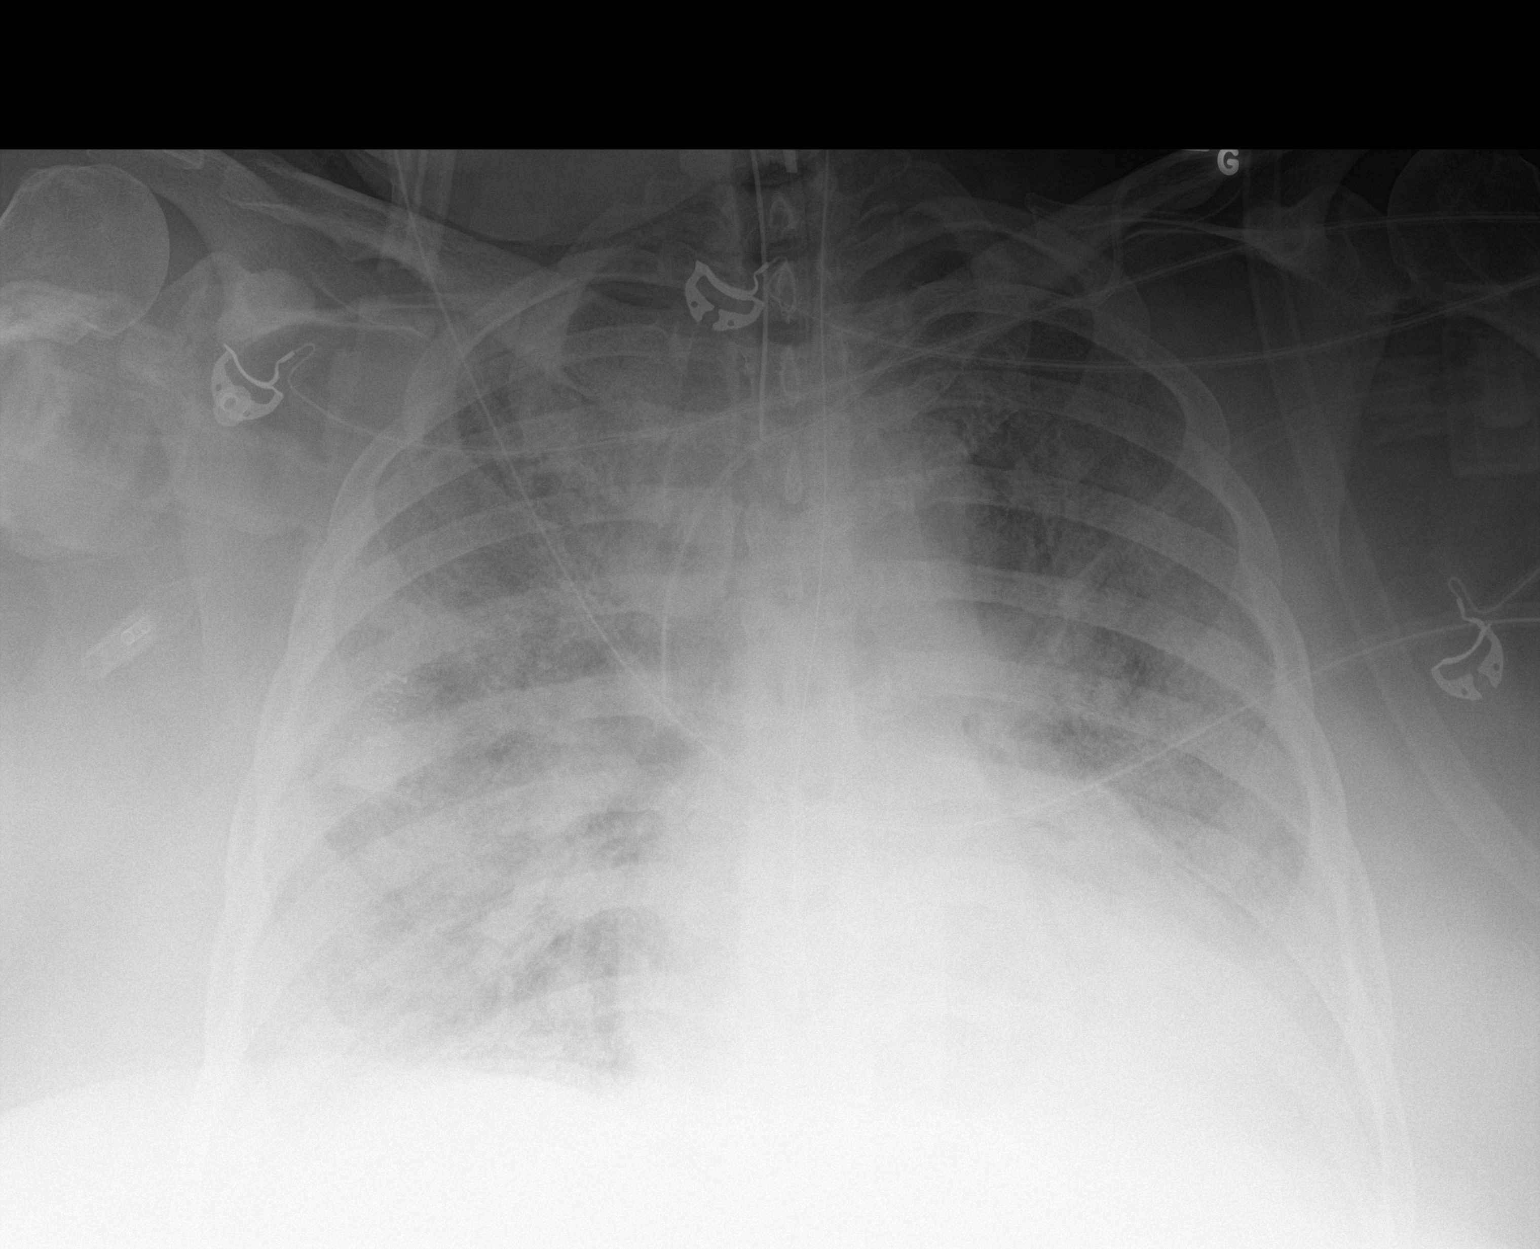

[1 of 1 positions shown; findings below may reference images not displayed]

FINDINGS: Endotracheal tube tip is 4.2 cm above the carina. Central catheter
tip is in the superior vena cava. Nasogastric tube tip and side port
are below the diaphragm. No pneumothorax.

There is interstitial and alveolar opacity throughout the lungs
bilaterally. There are small pleural effusions bilaterally. There is
cardiomegaly with pulmonary venous hypertension. No adenopathy. No
bone lesions.
IMPRESSION: Tube and catheter positions as described without pneumothorax.
Widespread interstitial volar opacity. Differential considerations
include congestive heart failure, widespread pneumonia, and ARDS.
More than one of these entities may exist concurrently. Appearance
similar to 1 day prior.

There is underlying pulmonary vascular congestion. No adenopathy
demonstrable.

## 2018-09-01 IMAGING — DX DG CHEST 1V PORT
1 series · 1 of 1 positions shown · non-contrast
Comparison: Yesterday.

CLINICAL DATA: Acute respiratory failure.  Ex-smoker.

EXAM:
PORTABLE CHEST 1 VIEW

[chest ap]
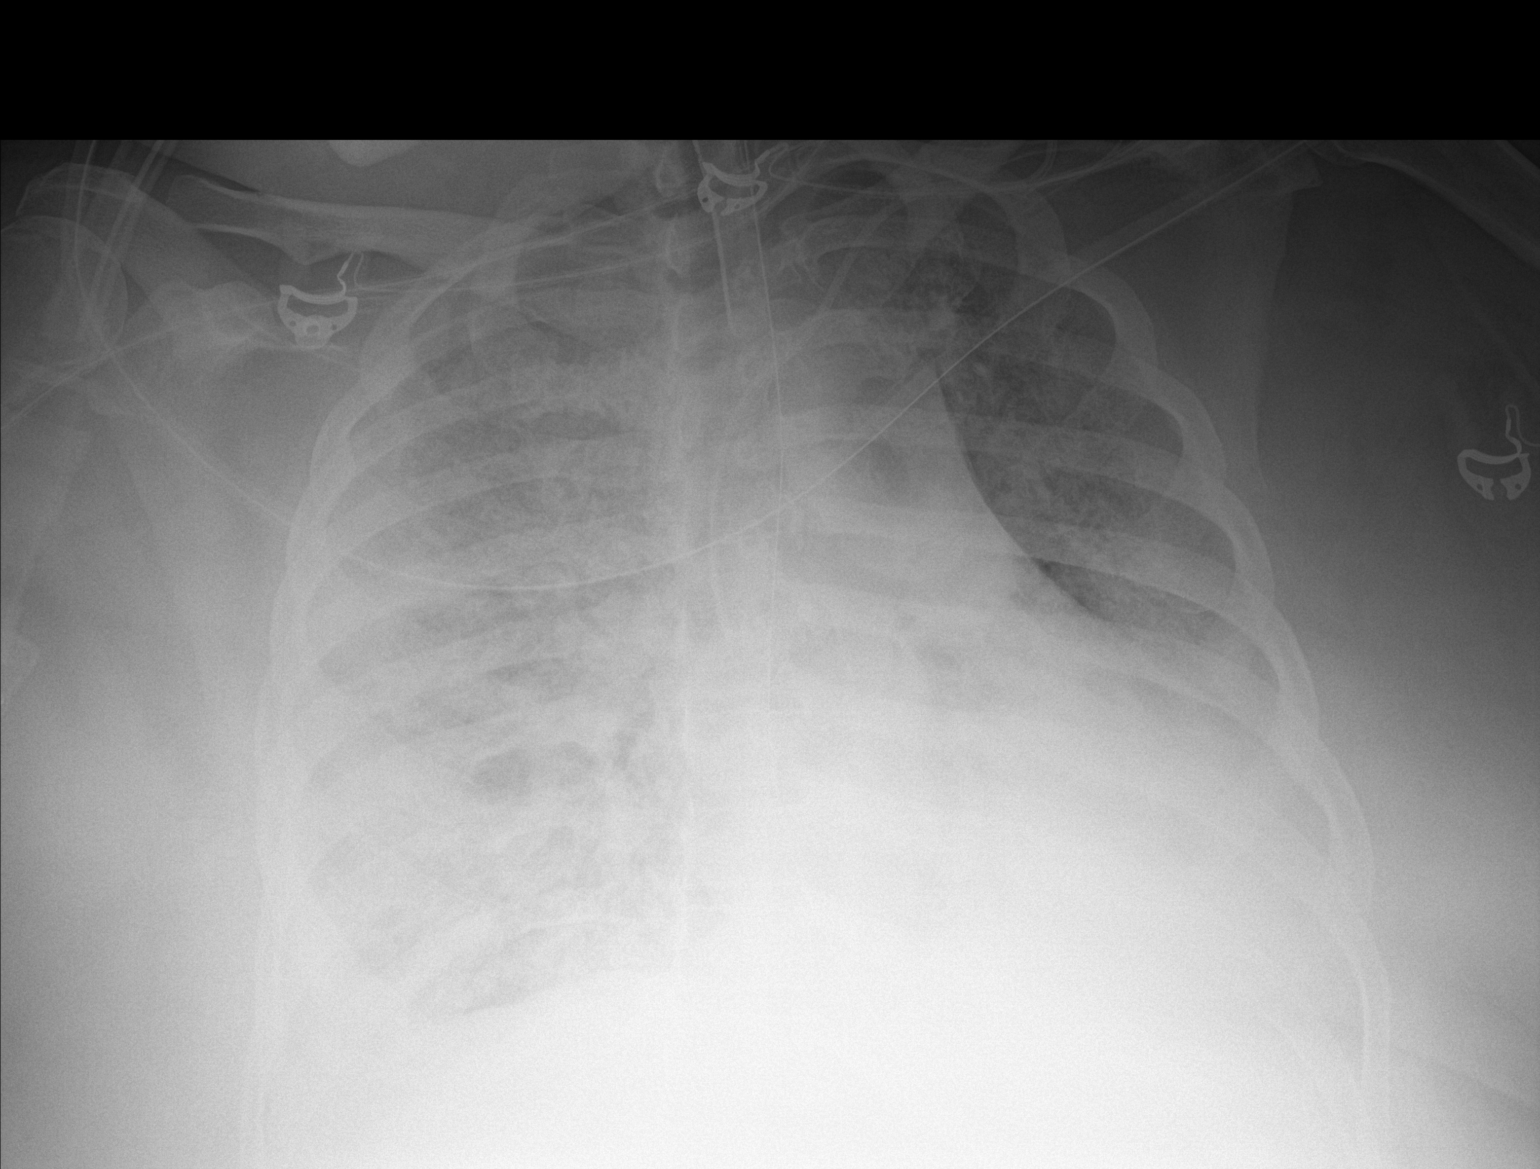

[1 of 1 positions shown; findings below may reference images not displayed]

FINDINGS: Endotracheal tube in satisfactory position. Left subclavian catheter
tip in the superior vena cava. Nasogastric tube extending into the
stomach.

The borders are obscured with no gross change in size of the cardiac
silhouette. Marked bilateral diffuse airspace opacity, mildly
increased. Normal appearing bones.
IMPRESSION: Mild increase in marked diffuse bilateral pneumonia, alveolar edema
or ARDS.

## 2018-09-02 IMAGING — DX DG CHEST 1V PORT
1 series · 1 of 1 positions shown · non-contrast
Comparison: 08/16/2017

CLINICAL DATA: Acute respiratory failure.

EXAM:
PORTABLE CHEST 1 VIEW

[chest ap]
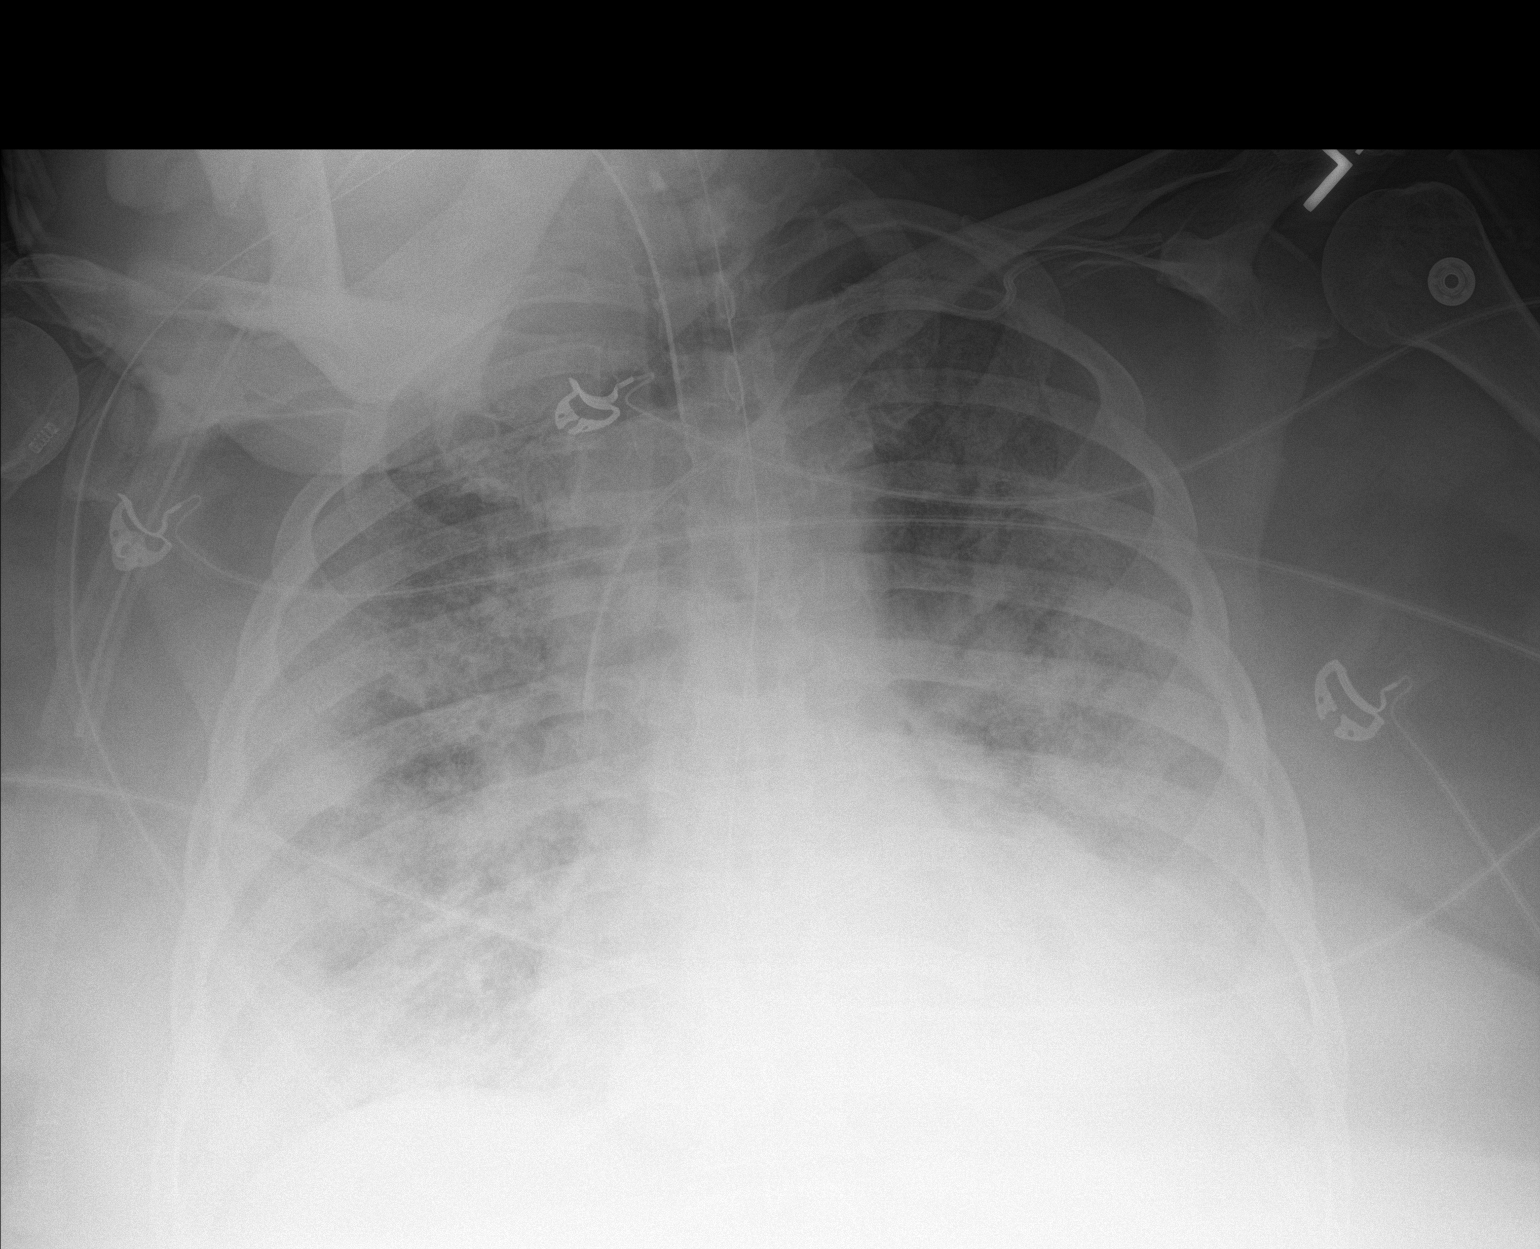

[1 of 1 positions shown; findings below may reference images not displayed]

FINDINGS: Endotracheal tube terminates at the inferior margin of the
clavicular heads, unchanged and well above the carina. Left
subclavian catheter terminates over the mid SVC. Enteric tube
courses into the left upper abdomen with tip not imaged. The
cardiomediastinal silhouette is grossly unchanged allowing for
differences in patient rotation as well as partial obscuration of
the heart border. Extensive airspace opacity throughout both lungs
has not significantly changed. No large pleural effusion or
pneumothorax is identified.
IMPRESSION: Unchanged diffuse bilateral airspace disease.

## 2018-09-03 IMAGING — DX DG CHEST 1V PORT
1 series · 1 of 1 positions shown · non-contrast
Comparison: August 17, 2017

CLINICAL DATA: Hypoxia

EXAM:
PORTABLE CHEST 1 VIEW

[chest ap]
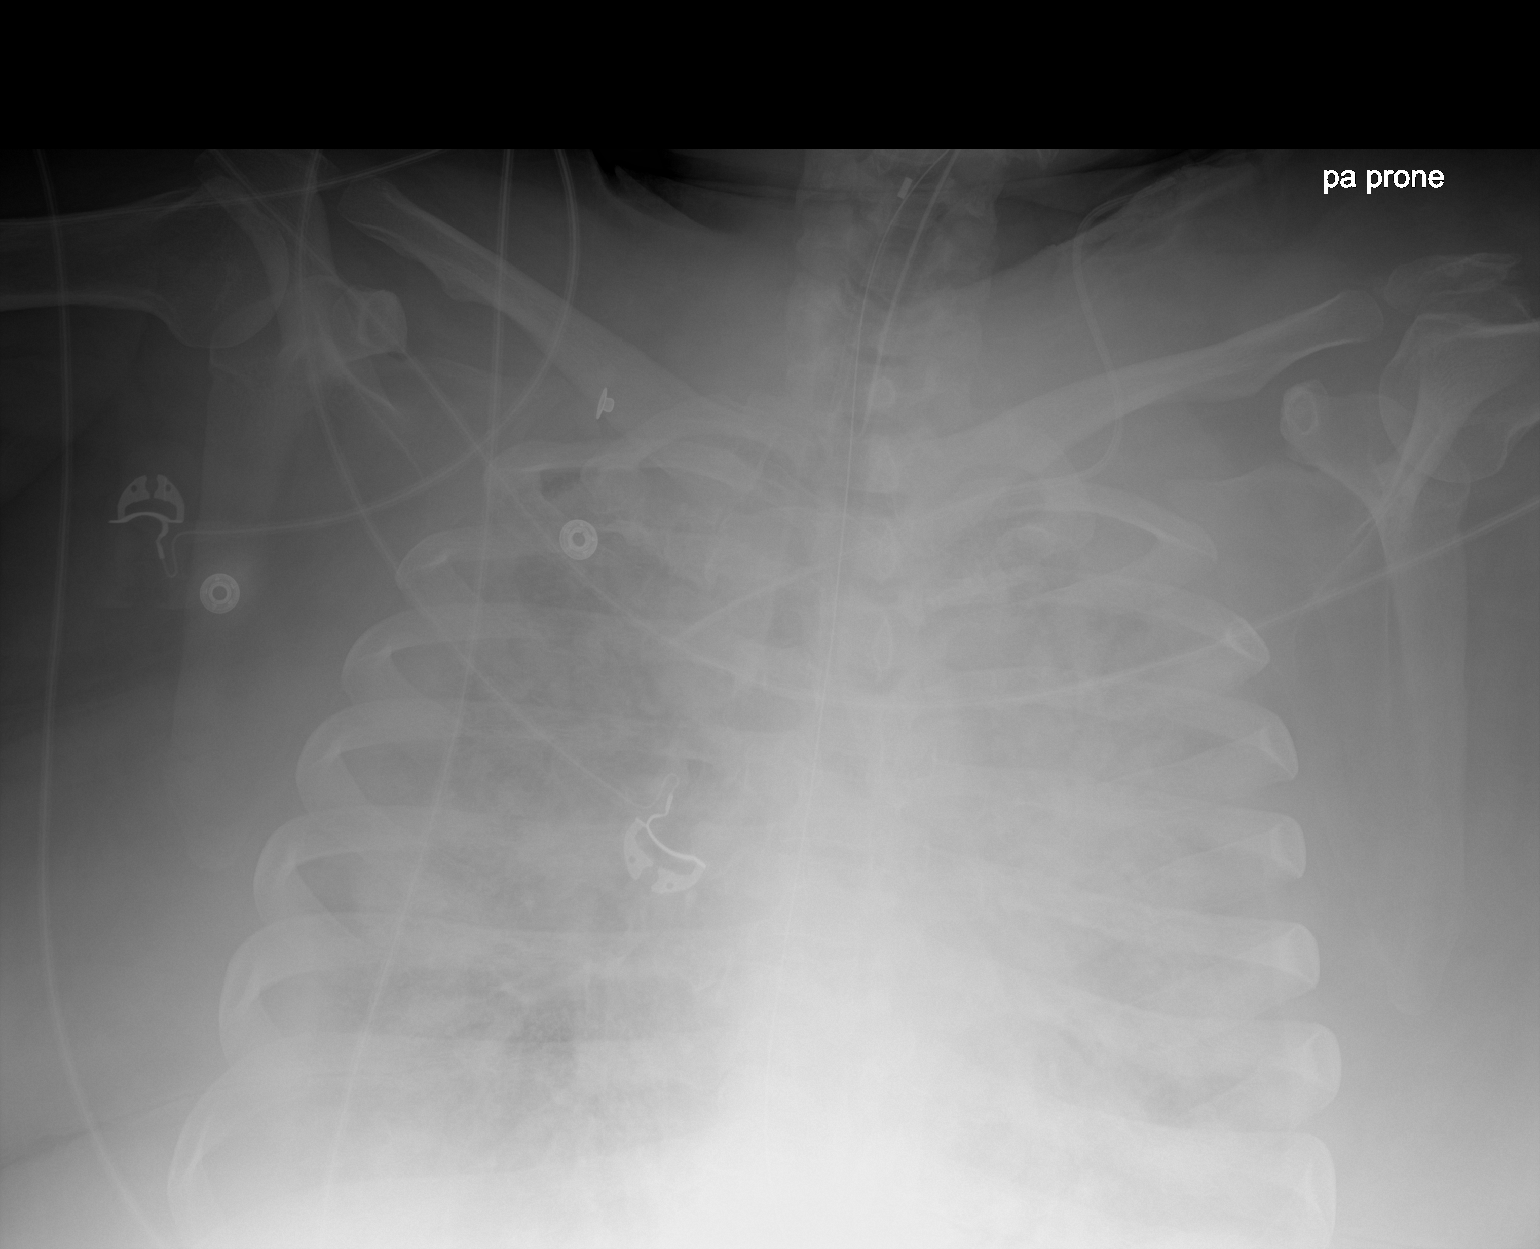

[1 of 1 positions shown; findings below may reference images not displayed]

FINDINGS: Endotracheal tube tip is 8.1 cm above the carina with the
endotracheal tube tip at the level of C7-T1. Central catheter tip is
at the junction of the left innominate vein and superior vena cava.
Nasogastric tube tip and side port are below the diaphragm. No
pneumothorax.

There is widespread interstitial and alveolar opacity diffusely.
There is cardiomegaly with pulmonary venous hypertension. There are
pleural effusions bilaterally. No bone lesions. No adenopathy
appreciable by radiography.
IMPRESSION: Tube and catheter positions as described without pneumothorax. The
endotracheal tube may need to be advanced 4-5 cm given its position
at C7-T1.

Widespread interstitial and alveolar opacity. The appearance raises
question of ARDS. There may be underlying congestive heart failure
and/or pneumonia. More than one of these entities may exist
concurrently. There is stable cardiac prominence.

## 2018-09-03 IMAGING — DX DG CHEST 1V
2 series · 2 of 2 positions shown · non-contrast
Comparison: Portable film earlier in the day.

CLINICAL DATA: Encounter for central line placement.

EXAM:
CHEST  1 VIEW

[chest ap (1 of 2)]
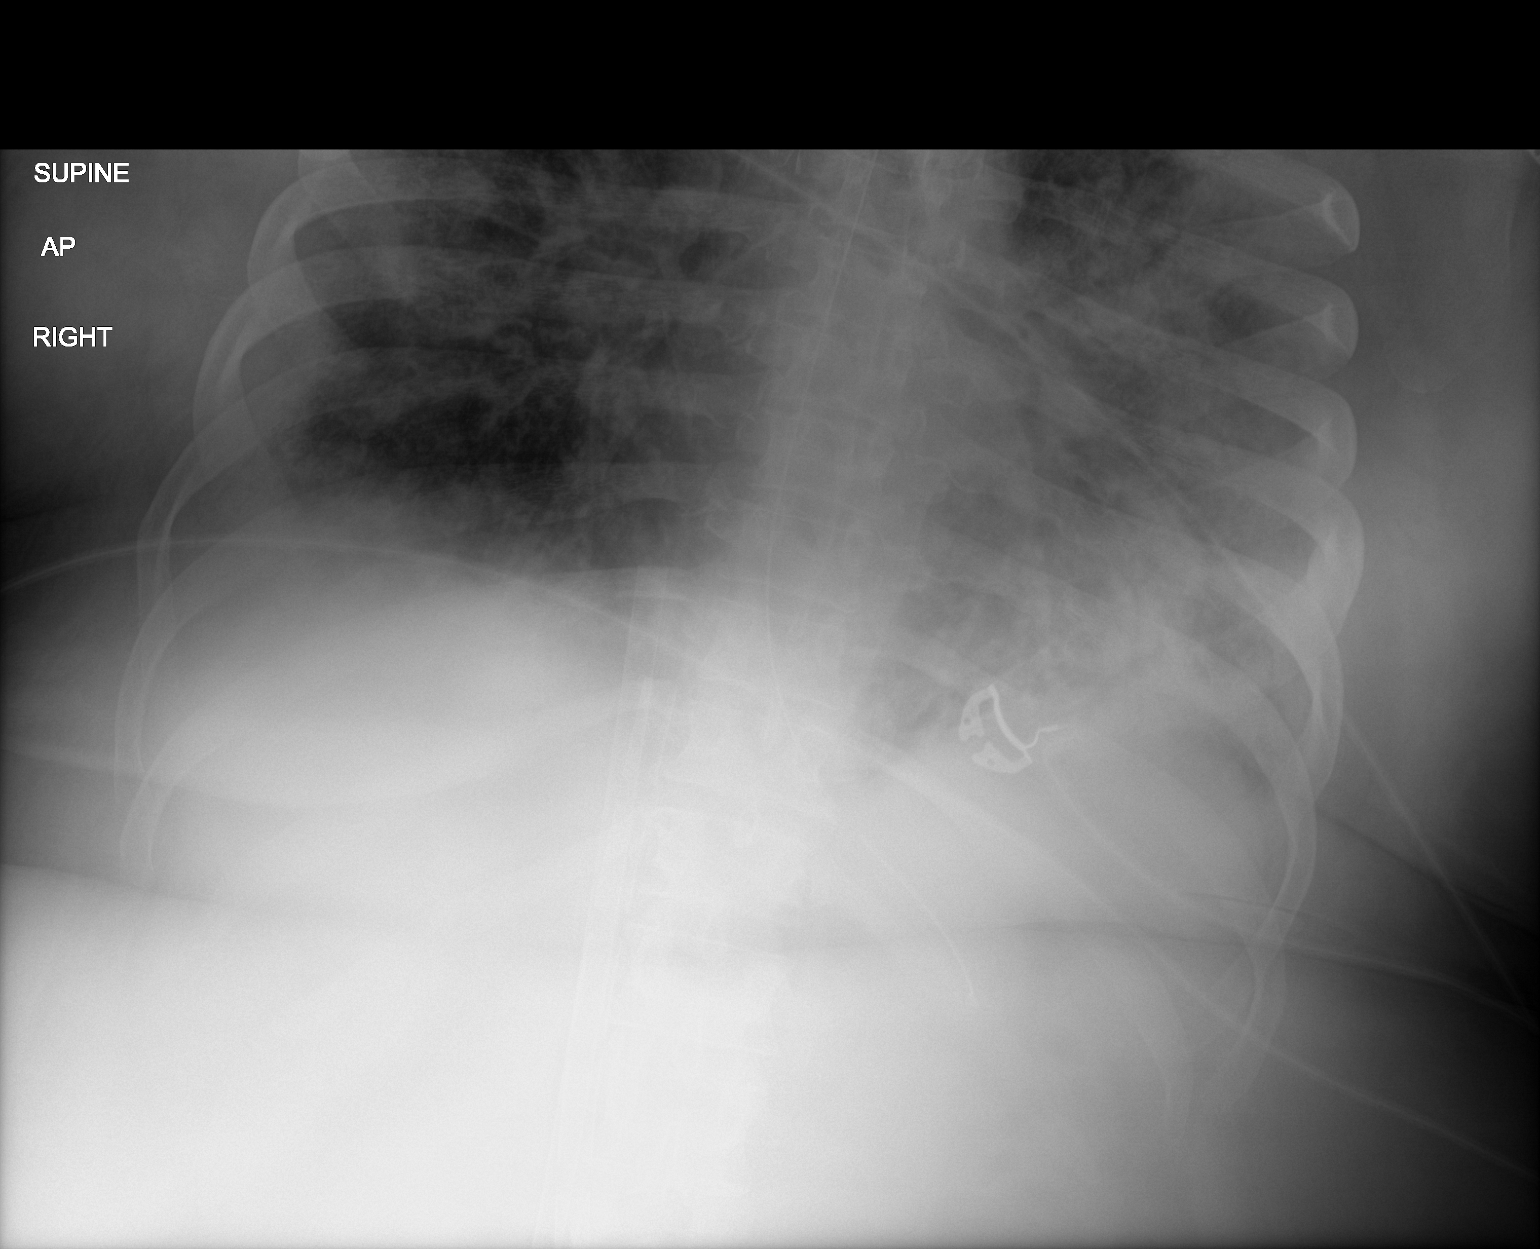

[chest ap (2 of 2)]
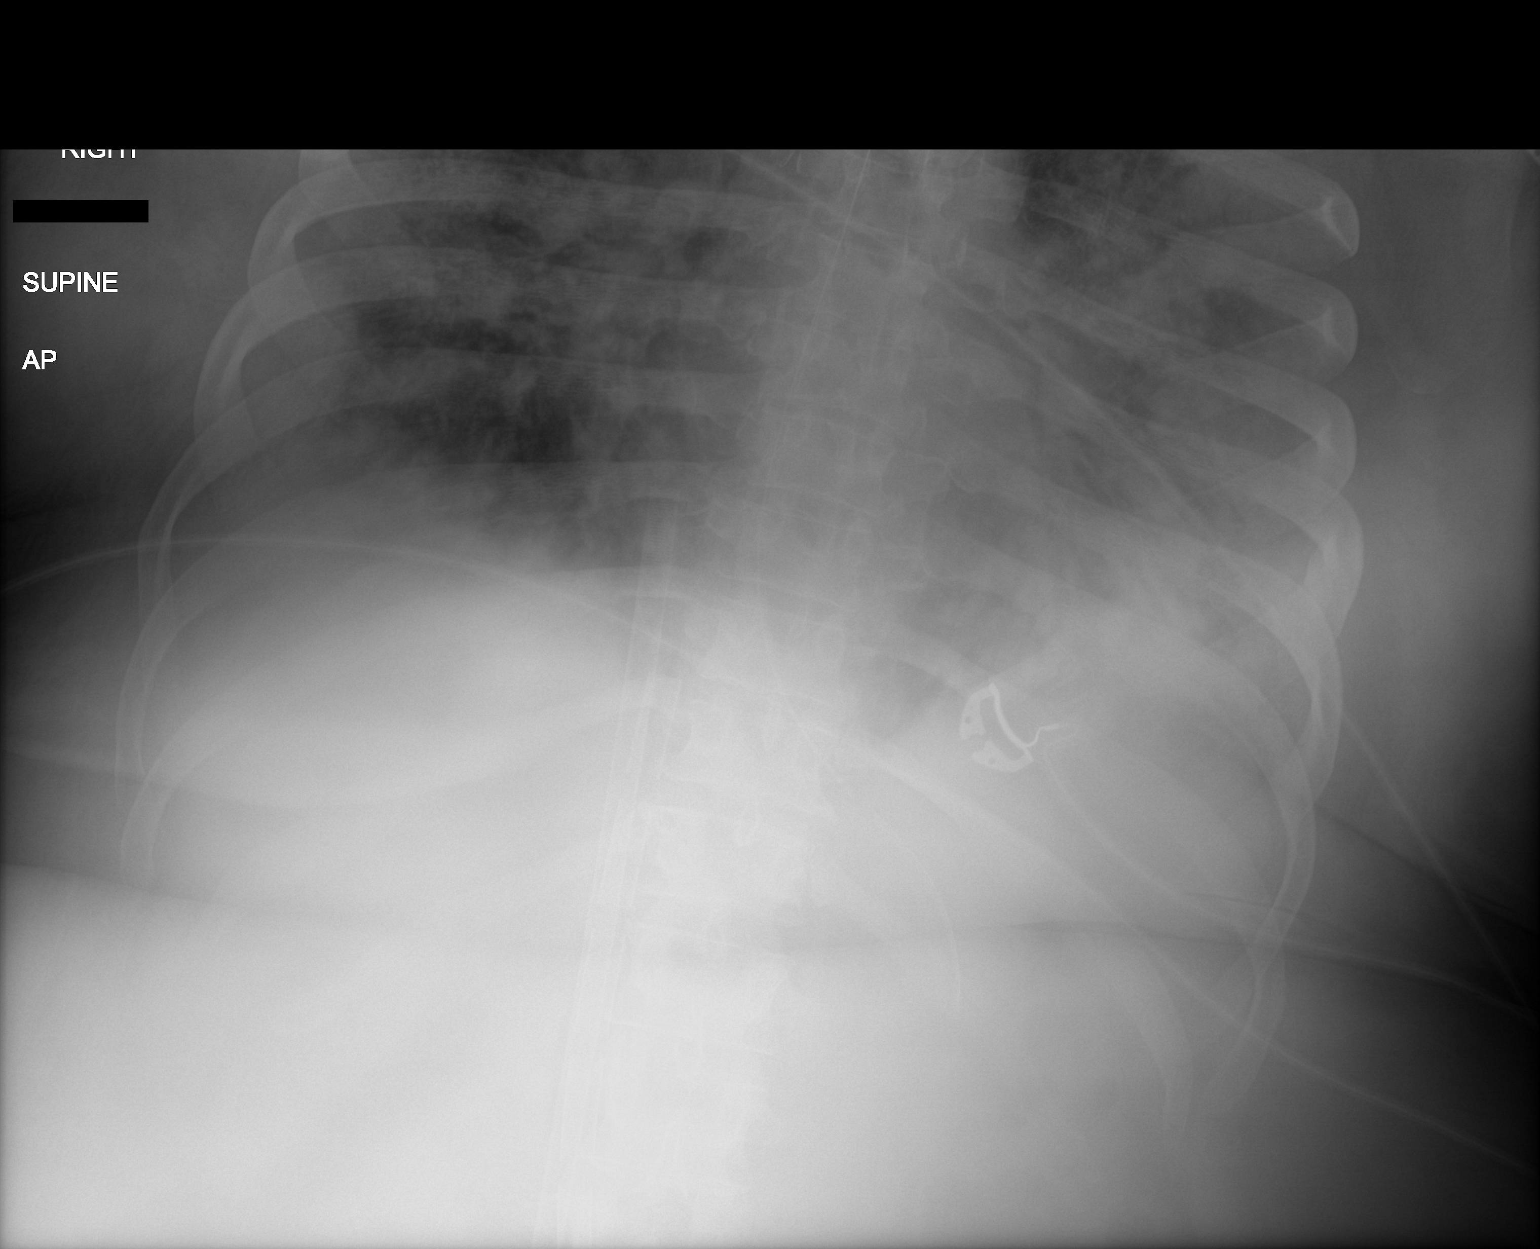

[2 of 2 positions shown; findings below may reference images not displayed]

FINDINGS: Lung apices are not included on images provided.

Heart size is unchanged. Aeration is improved, with some clearing of
diffuse opacities. ET tube is not seen.

A central line was placed, but is barely visible on this film. The
tip appears to lie within the proximal SVC. I am unable to exclude a
pneumothorax due to the technical factors which resulted in
suboptimal positioning.
IMPRESSION: Lung apices are not included in the radiograph. I am unable to
exclude a pneumothorax, although there is no evidence for such on
the hemithorax which is included. Central venous catheter tip
appears to lie in the proximal SVC.

Recommend repeat radiograph to include the entire chest, including
lung apices, to assess for pneumothorax and ETT placement.

Overall there appears to be slight improvement in aeration compared
with most recent prior.

## 2018-09-10 ENCOUNTER — Telehealth: Payer: Self-pay

## 2018-09-10 ENCOUNTER — Other Ambulatory Visit: Payer: Self-pay | Admitting: Internal Medicine

## 2018-09-10 MED ORDER — FUROSEMIDE 40 MG PO TABS: 40.0000 mg | ORAL_TABLET | Freq: Every day | ORAL | 1 refills | Status: DC

## 2018-09-10 MED ORDER — PREDNISONE 10 MG PO TABS: ORAL_TABLET | ORAL | 0 refills | Status: DC

## 2018-09-10 NOTE — Telephone Encounter (Signed)
When is her appointment with Dr Annamaria Boots? I have discussed this with her  On several occassions but she does not make an effort to make one.She needs to see her Pulmonologist to determine if she needs a maintenance dose of steroids I will order a short course of Prednisone this time and subsequently will need to come from Pulmonary. Lasix refilled. She will need to go to the ED if symptoms do not improve

## 2018-09-10 NOTE — Telephone Encounter (Signed)
Call placed to patient and informed her that the prescriptions for prednisone and lasix were sent to her pharmacy

## 2018-09-10 NOTE — Telephone Encounter (Signed)
Call received from patient.  She explained that she is trying to stay out of the hospital.  She has been waking up at night and experiences some episodes of tightness in her chest when breathing. She is requesting refills of lasix and prednisone. Informed her that Dr Margarita Rana would be notified.

## 2018-09-24 ENCOUNTER — Other Ambulatory Visit: Payer: Self-pay

## 2018-09-24 ENCOUNTER — Ambulatory Visit: Payer: BLUE CROSS/BLUE SHIELD | Attending: Family Medicine | Admitting: Physician Assistant

## 2018-09-24 DIAGNOSIS — J4541 Moderate persistent asthma with (acute) exacerbation: Secondary | ICD-10-CM

## 2018-09-24 MED ORDER — PREDNISONE 10 MG PO TABS: ORAL_TABLET | ORAL | 0 refills | Status: DC

## 2018-09-24 NOTE — Progress Notes (Signed)
Virtual Visit via Telephone Note  I connected with April [name] on 09/24/18 at  1:50 PM EDT by telephone and verified that I am speaking with the correct person using two identifiers.   I discussed the limitations, risks, security and privacy concerns of performing an evaluation and management service by telephone and the availability of in person appointments. I also discussed with the patient that there may be a patient responsible charge related to this service. The patient expressed understanding and agreed to proceed.  Patient location:  home My Location:  [place] office Persons on the call:  Myself and the patient  History of Present Illness: Patient c/o asthma exacerbation.  Increased wheezing over the last few days.  She had to take prednisone earlier in the month which helped a lot.  Has pulmonology appt on 10/01/2018.  No fever.  No discolored mucus.  Compliant with all inhalers.  Not an acute asthma attack-just increasing wheezing and albuterol use over the last few days.  No orthopnea.  No new edema.  No Covid s/sx.    Observations/Objective:  A&OX3.  TP linear.  Speech is clear   Assessment and Plan: 1. Moderate persistent asthma with acute exacerbation To ED if becomes severe or resp distress.  Patient agrees.  Continue current meds. - predniSONE (DELTASONE) 10 MG tablet; 6,5,4,3,2,1 take each days dose all at once with food in the morning  Dispense: 21 tablet; Refill: 0    Follow Up Instructions: Keep follow up with pulmonolgy   I discussed the assessment and treatment plan with the patient. The patient was provided an opportunity to ask questions and all were answered. The patient agreed with the plan and demonstrated an understanding of the instructions.   The patient was advised to call back or seek an in-person evaluation if the symptoms worsen or if the condition fails to improve as anticipated.  I provided 10 minutes of non-face-to-face time during this  encounter.   Freeman Caldron, PA-C  Patient ID: April [name], female   DOB: 1970-06-28, 48 y.o.   MRN: [id number]

## 2018-09-30 ENCOUNTER — Encounter (HOSPITAL_COMMUNITY): Payer: Self-pay | Admitting: Emergency Medicine

## 2018-09-30 ENCOUNTER — Emergency Department (HOSPITAL_COMMUNITY)
Admission: EM | Admit: 2018-09-30 | Discharge: 2018-09-30 | Discharge status: Against Medical Advice | Payer: BLUE CROSS/BLUE SHIELD | Attending: Emergency Medicine | Admitting: Emergency Medicine

## 2018-09-30 ENCOUNTER — Other Ambulatory Visit: Payer: Self-pay

## 2018-09-30 DIAGNOSIS — Z5321 Procedure and treatment not carried out due to patient leaving prior to being seen by health care provider: Secondary | ICD-10-CM | POA: Diagnosis not present

## 2018-09-30 DIAGNOSIS — R0600 Dyspnea, unspecified: Secondary | ICD-10-CM | POA: Diagnosis present

## 2018-09-30 NOTE — ED Triage Notes (Signed)
Pt c/o asthma flare, reports using home inhalers with no relief.

## 2018-09-30 NOTE — ED Notes (Signed)
Pt came to desk does not want to wait any longer.  Encouraged pt to stay, pt refused.  Advised to come back if s/s worsens or persists.

## 2018-10-01 ENCOUNTER — Ambulatory Visit: Payer: BLUE CROSS/BLUE SHIELD | Admitting: Internal Medicine

## 2018-10-05 ENCOUNTER — Emergency Department (HOSPITAL_COMMUNITY)
Admission: EM | Admit: 2018-10-05 | Discharge: 2018-10-05 | Disposition: A | Discharge status: Home / Self Care | Payer: Medicare Other | Attending: Emergency Medicine | Admitting: Emergency Medicine

## 2018-10-05 ENCOUNTER — Emergency Department (HOSPITAL_COMMUNITY): Payer: Medicare Other

## 2018-10-05 ENCOUNTER — Other Ambulatory Visit: Payer: Self-pay

## 2018-10-05 ENCOUNTER — Encounter: Payer: Self-pay | Admitting: Adult Health

## 2018-10-05 ENCOUNTER — Ambulatory Visit (INDEPENDENT_AMBULATORY_CARE_PROVIDER_SITE_OTHER): Payer: Medicare Other | Admitting: Adult Health

## 2018-10-05 DIAGNOSIS — J4541 Moderate persistent asthma with (acute) exacerbation: Secondary | ICD-10-CM

## 2018-10-05 DIAGNOSIS — I110 Hypertensive heart disease with heart failure: Secondary | ICD-10-CM | POA: Diagnosis not present

## 2018-10-05 DIAGNOSIS — Z20828 Contact with and (suspected) exposure to other viral communicable diseases: Secondary | ICD-10-CM | POA: Insufficient documentation

## 2018-10-05 DIAGNOSIS — Z87891 Personal history of nicotine dependence: Secondary | ICD-10-CM | POA: Diagnosis not present

## 2018-10-05 DIAGNOSIS — R0602 Shortness of breath: Secondary | ICD-10-CM | POA: Diagnosis present

## 2018-10-05 DIAGNOSIS — I5032 Chronic diastolic (congestive) heart failure: Secondary | ICD-10-CM | POA: Diagnosis not present

## 2018-10-05 DIAGNOSIS — Z7901 Long term (current) use of anticoagulants: Secondary | ICD-10-CM | POA: Insufficient documentation

## 2018-10-05 DIAGNOSIS — Z79899 Other long term (current) drug therapy: Secondary | ICD-10-CM | POA: Diagnosis not present

## 2018-10-05 DIAGNOSIS — Z20822 Contact with and (suspected) exposure to covid-19: Secondary | ICD-10-CM

## 2018-10-05 LAB — CBC WITH DIFFERENTIAL/PLATELET
Abs Immature Granulocytes: 0.13 10*3/uL — ABNORMAL HIGH (ref 0.00–0.07)
Basophils Absolute: 0.1 10*3/uL (ref 0.0–0.1)
Basophils Relative: 0 %
Eosinophils Absolute: 0.8 10*3/uL — ABNORMAL HIGH (ref 0.0–0.5)
Eosinophils Relative: 6 %
HCT: 41.7 % (ref 36.0–46.0)
Hemoglobin: 12.5 g/dL (ref 12.0–15.0)
Immature Granulocytes: 1 %
Lymphocytes Relative: 25 %
Lymphs Abs: 3.6 10*3/uL (ref 0.7–4.0)
MCH: 26.8 pg (ref 26.0–34.0)
MCHC: 30 g/dL (ref 30.0–36.0)
MCV: 89.3 fL (ref 80.0–100.0)
Monocytes Absolute: 1.2 10*3/uL — ABNORMAL HIGH (ref 0.1–1.0)
Monocytes Relative: 8 %
Neutro Abs: 8.6 10*3/uL — ABNORMAL HIGH (ref 1.7–7.7)
Neutrophils Relative %: 60 %
Platelets: 377 10*3/uL (ref 150–400)
RBC: 4.67 MIL/uL (ref 3.87–5.11)
RDW: 17 % — ABNORMAL HIGH (ref 11.5–15.5)
WBC: 14.4 10*3/uL — ABNORMAL HIGH (ref 4.0–10.5)
nRBC: 0 % (ref 0.0–0.2)

## 2018-10-05 LAB — BASIC METABOLIC PANEL
Anion gap: 8 (ref 5–15)
BUN: 9 mg/dL (ref 6–20)
CO2: 25 mmol/L (ref 22–32)
Calcium: 8.6 mg/dL — ABNORMAL LOW (ref 8.9–10.3)
Chloride: 104 mmol/L (ref 98–111)
Creatinine, Ser: 0.66 mg/dL (ref 0.44–1.00)
GFR calc Af Amer: >60 mL/min (ref >60)
GFR calc non Af Amer: >60 mL/min (ref >60)
Glucose, Bld: 99 mg/dL (ref 70–99)
Potassium: 4.2 mmol/L (ref 3.5–5.1)
Sodium: 137 mmol/L (ref 135–145)

## 2018-10-05 LAB — SARS CORONAVIRUS 2 BY RT PCR (HOSPITAL ORDER, PERFORMED IN ~~LOC~~ HOSPITAL LAB): SARS Coronavirus 2: NEGATIVE

## 2018-10-05 LAB — I-STAT BETA HCG BLOOD, ED (MC, WL, AP ONLY): I-stat hCG, quantitative: <5 m[IU]/mL (ref <5)

## 2018-10-05 MED ORDER — ALBUTEROL SULFATE HFA 108 (90 BASE) MCG/ACT IN AERS
4.0000 | INHALATION_SPRAY | Freq: Once | RESPIRATORY_TRACT | Status: AC
  Administered 2018-10-05: 4 via RESPIRATORY_TRACT
  Filled 2018-10-05 (×2): qty 6.7

## 2018-10-05 MED ORDER — PREDNISONE 20 MG PO TABS: ORAL_TABLET | ORAL | 0 refills | Status: DC

## 2018-10-05 MED ORDER — IPRATROPIUM-ALBUTEROL 0.5-2.5 (3) MG/3ML IN SOLN
3.0000 mL | Freq: Once | RESPIRATORY_TRACT | Status: AC
  Administered 2018-10-05: 3 mL via RESPIRATORY_TRACT
  Filled 2018-10-05: qty 3

## 2018-10-05 MED ORDER — PREDNISONE 20 MG PO TABS
60.0000 mg | ORAL_TABLET | Freq: Once | ORAL | Status: AC
  Administered 2018-10-05: 60 mg via ORAL
  Filled 2018-10-05: qty 3

## 2018-10-05 MED ORDER — IPRATROPIUM BROMIDE HFA 17 MCG/ACT IN AERS
2.0000 | INHALATION_SPRAY | Freq: Once | RESPIRATORY_TRACT | Status: AC
  Administered 2018-10-05: 2 via RESPIRATORY_TRACT
  Filled 2018-10-05 (×2): qty 12.9

## 2018-10-05 MED ORDER — IPRATROPIUM BROMIDE 0.02 % IN SOLN: 0.5000 mg | Freq: Four times a day (QID) | RESPIRATORY_TRACT | 12 refills | Status: DC

## 2018-10-05 NOTE — ED Notes (Signed)
ED Provider at bedside. 

## 2018-10-05 NOTE — ED Notes (Addendum)
Awaiting Atrovent inhaler from pharmacy- Albuterol inhaler sent from pharmacy, but not Atrovent. Called and spoke with Myriam Jacobson in Pharmacy to send Atrovent INH.

## 2018-10-05 NOTE — ED Provider Notes (Signed)
Sent for Covid testing from pulmonary clinic.[name] Suspected asthma exacerbation. If Covid negative, consider neb treatment. Anticipated D/C. Prednisone x 5 days. Physical Exam  BP (!) 153/85   Pulse 86   Temp 98.4 F (36.9 C) (Oral)   Resp 18   SpO2 98%   Physical Exam  ED Course/Procedures     Procedures  MDM  Patient is coronavirus testing is negative.  Chest x-ray is clear.  I have reassessed the patient.  She is alert and in no acute distress.  Reports that at this point she actually feels much better.  She does not show signs of acute respiratory distress.  Re-auscultation of lung fields is for fair airflow to the mid and lower fields with occasional expiratory wheeze.  Patient does not have any peripheral edema.  Calves are soft and nontender.  He denies any chest pain.  Patient reports that her breathing feels stable for going home but she would like to do a nebulizer treatment before leaving.  She is advised that coronavirus is negative but she will need to continue careful self isolation which she plans to do.  She is counseled to follow-up with her pulmonologist as soon as possible and to return immediately with any worsening of her symptoms or other concerns.       Charlesetta Shanks, MD 10/05/18 [phone]

## 2018-10-05 NOTE — Assessment & Plan Note (Signed)
Suspected exacerbation - with dyspnea , cough and wheezing . Patient with increased wob , +accessory use.  O2 sats are stable . Patient has multiple co-morbidiites and previous resp failure with trach .  Due to COVID 19 office/staff/patient precautions   -patient will need further evaluation and COVID 19 screening . She was monitored closely and EMS called with arrival to office with report given by Carrianne Hyun NP  EMS transport to ER .

## 2018-10-05 NOTE — ED Triage Notes (Signed)
Transported by [facility] from [place] office-- increased SHOB, wheezing, cough and bilateral leg pain. Hx of asthma. No relief with home albuterol neb treatments.  Vitals: 132/96 24 RR 98.8 F 88 HR  98 % RA

## 2018-10-05 NOTE — ED Provider Notes (Signed)
[facility] DEPT Provider Note   CSN: [id number] Arrival date & time: 10/05/18  1129    History   Chief Complaint Chief Complaint  Patient presents with  . Shortness of Breath    HPI April [name] is a 48 y.o. female.     HPI 48 year old female with a history of asthma who presents the emergency department from pulmonary clinic with increased shortness of breath wheezing cough and bilateral leg pain.  She reports a history of DVT or pulmonary embolism.  No known sick contacts or patients positive or under investigation for COVID-19.  Denies fevers and chills.  Reports trying her bronchodilators at home without improvement in her symptoms.  Has not been on steroids in the past several days.   Past Medical History:  Diagnosis Date  . Acanthosis nigricans   . Anxiety   . Arthritis    "knees" (04/28/2017)  . Asthma    Followed by Dr. Annamaria Boots (pulmonology); receives every other week omalizumab injections; has frequent exacerbations  . Chronic diastolic CHF (congestive heart failure) ([place]) 01/17/2017  . COPD (chronic obstructive pulmonary disease) ([place])    PFTs in 2002, FEV1/FVC 65, no post bronchodilater test done  . Depression   . GERD (gastroesophageal reflux disease)   . Headache(784.0)    "q couple days" (04/28/2017)  . Helicobacter pylori (H. pylori) infection   . Hypertension, essential   . Insomnia   . Menorrhagia   . Morbid obesity ([place])   . OSA on CPAP    Sleep study 2008 - mild OSA, not enough events to titrate CPAP; wears CPAP now/pt on 04/28/2017  . Pneumonia X 1  . Seasonal allergies   . Shortness of breath   . Tobacco user     Patient Active Problem List   Diagnosis Date Noted  . Acute respiratory distress 08/03/2018  . Acute exacerbation of chronic obstructive pulmonary disease (COPD) ([place]) 08/03/2018  . Vitamin D deficiency 05/26/2018  . Acute asthma exacerbation 05/16/2018  . Urinary incontinence 11/17/2017  . Nausea with  vomiting 11/06/2017  . Hypomagnesemia 10/27/2017  . Dysphagia 10/27/2017  . Physical deconditioning 10/25/2017  . Acute maxillary sinusitis 10/24/2017  . Emesis, persistent 10/08/2017  . Volume depletion 10/08/2017  . Pleuritic chest pain 10/08/2017  . SOB (shortness of breath) 10/08/2017  . Debility 09/22/2017  . HCAP (healthcare-associated pneumonia) 08/06/2017  . Chronic atrial fibrillation 07/29/2017  . AF (paroxysmal atrial fibrillation) ([place])   . COPD with acute exacerbation ([place]) 02/10/2017  . Dyspnea 02/04/2017  . Chronic diastolic CHF (congestive heart failure) ([place]) 01/17/2017  . Asthma exacerbation 01/17/2017  . Metabolic syndrome 10/93/2355  . Moderate persistent asthma with exacerbation 10/19/2016  . Normocytic anemia 10/02/2016  . Elevated hemoglobin A1c 05/05/2016  . GERD (gastroesophageal reflux disease) 08/30/2015  . Anxiety and depression 08/30/2015  . Generalized anxiety disorder 08/30/2015  . Seasonal allergic rhinitis 08/29/2013  . Leukocytosis 10/21/2012  . Tobacco abuse in remission 10/07/2012  . COPD mixed type ([place]) 05/07/2012  . Knee pain, bilateral 04/25/2011  . Primary insomnia 03/14/2011  . OSA on CPAP 12/19/2010  . Obstructive sleep apnea 12/19/2010  . Hypokalemia 08/13/2010  . Cervical back pain with evidence of disc disease 04/08/2008  . Essential hypertension 07/31/2006  . Morbid obesity with body mass index of 50.0-59.9 in adult ([place]) 06/17/2006  . Major depressive disorder, recurrent episode ([place]) 04/10/2006    Past Surgical History:  Procedure Laterality Date  . CARDIOVERSION N/A 05/30/2017   Procedure: CARDIOVERSION;  Surgeon: Sanda Klein, MD;  Location: [facility];  Service: Cardiovascular;  Laterality: N/A;  . REDUCTION MAMMAPLASTY Bilateral 09/2011  . TUBAL LIGATION  1996   bilateral     OB History   No obstetric history on file.      Home Medications    Prior to Admission medications   Medication Sig Start Date End  Date Taking? Authorizing Provider  albuterol (PROVENTIL HFA;VENTOLIN HFA) 108 (90 Base) MCG/ACT inhaler Inhale 2 puffs into the lungs every 4 (four) hours as needed for wheezing or shortness of breath. 08/12/18   [name], MD  albuterol (PROVENTIL) (2.5 MG/3ML) 0.083% nebulizer solution Take 3 mLs (2.5 mg total) by nebulization every 6 (six) hours as needed for wheezing or shortness of breath. 08/12/18   [name], MD  apixaban (ELIQUIS) 5 MG TABS tablet Take 1 tablet (5 mg total) by mouth 2 (two) times daily. 08/12/18   [name], MD  cetirizine (ZYRTEC) 10 MG tablet Take 10 mg by mouth daily. 10/21/17   [provider]  DULoxetine (CYMBALTA) 60 MG capsule Take 1 capsule (60 mg total) by mouth daily. Patient not taking: Reported on 09/24/2018 08/12/18   [name], MD  ergocalciferol (DRISDOL) 1.25 MG (50000 UT) capsule Take 1 capsule (50,000 Units total) by mouth once a week. 05/26/18   [name], MD  Ferrous Sulfate (IRON) 325 (65 Fe) MG TABS Take 1 tablet (325 mg total) by mouth every morning. Patient taking differently: Take 1 tablet by mouth 2 (two) times daily.  01/26/18   Argentina Donovan, PA-C  fluticasone (FLONASE) 50 MCG/ACT nasal spray Place 1 spray into both nostrils daily. 08/12/18   [name], MD  furosemide (LASIX) 40 MG tablet Take 1 tablet (40 mg total) by mouth daily. 09/10/18   Ladell Pier, MD  gabapentin (NEURONTIN) 300 MG capsule Take 2 capsules (600 mg total) by mouth 2 (two) times daily. 08/12/18   [name], MD  guaiFENesin (MUCINEX) 600 MG 12 hr tablet Take 2 tablets (1,200 mg total) by mouth 2 (two) times daily. Patient not taking: Reported on 08/12/2018 05/20/18   Cristal Ford, DO  hydrALAZINE (APRESOLINE) 10 MG tablet Take 1 tablet (10 mg total) by mouth every 8 (eight) hours for 30 days. 08/04/18 09/03/18  [name], NP  ipratropium-albuterol (DUONEB) 0.5-2.5 (3) MG/3ML SOLN Inhale 3 ml two times daily and every 2 hours  as needed for shortness of breath or wheezing Inhale 3 ml two times daily and every 2 hours as needed for shortness of breath or wheezing Patient taking differently: Inhale 3 mLs into the lungs every 2 (two) hours as needed (wheezing/shortness of breath).  05/13/18   Lorin Glass, PA-C  metoprolol tartrate (LOPRESSOR) 50 MG tablet Take 1 tablet (50 mg total) by mouth 2 (two) times daily. 08/12/18   [name], MD  mometasone-formoterol (DULERA) 200-5 MCG/ACT AERO Inhale 2 puffs into the lungs 2 (two) times daily. 07/16/18   [name], MD  montelukast (SINGULAIR) 10 MG tablet Take 1 tablet (10 mg total) by mouth at bedtime. 08/12/18   [name], MD  nicotine (NICODERM CQ - DOSED IN MG/24 HOURS) 21 mg/24hr patch Place 1 patch (21 mg total) onto the skin daily. Patient not taking: Reported on 08/12/2018 05/21/18   Cristal Ford, DO  omeprazole (PRILOSEC) 20 MG capsule Take 1 capsule (20 mg total) by mouth daily. 08/12/18   [name], MD  oxyCODONE-acetaminophen (PERCOCET) 10-325 MG tablet Take 1 tablet by  mouth every 4 (four) hours as needed for pain.    [provider]  potassium chloride SA (K-DUR,KLOR-CON) 20 MEQ tablet Take 1 tablet (20 mEq total) by mouth daily. 08/04/18   Black, Lezlie Octave, NP  predniSONE (DELTASONE) 10 MG tablet 6,5,4,3,2,1 take each days dose all at once with food in the morning 09/24/18   Argentina Donovan, PA-C  zolpidem (AMBIEN) 5 MG tablet TAKE 1 TABLET(5 MG) BY MOUTH AT BEDTIME Patient taking differently: Take 5 mg by mouth at bedtime.  07/16/18   [name], MD    Family History Family History  Problem Relation Age of Onset  . Hypertension Mother   . Asthma Daughter   . Cancer Paternal Aunt   . Asthma Maternal Grandmother     Social History Social History   Tobacco Use  . Smoking status: Former Smoker    Packs/day: 0.50    Years: 26.00    Pack years: 13.00    Types: Cigarettes    Last attempt to quit: 09/12/2014    Years  since quitting: 4.0  . Smokeless tobacco: Never Used  Substance Use Topics  . Alcohol use: No  . Drug use: No     Allergies   Contrast media [iodinated diagnostic agents]   Review of Systems Review of Systems  All other systems reviewed and are negative.    Physical Exam Updated Vital Signs BP (!) 173/119   Pulse 94   Temp 98.4 F (36.9 C) (Oral)   Resp (!) 23   SpO2 98%   Physical Exam Vitals signs and nursing note reviewed.  Constitutional:      General: She is not in acute distress.    Appearance: She is well-developed.  HENT:     Head: Normocephalic and atraumatic.  Neck:     Musculoskeletal: Normal range of motion.  Cardiovascular:     Rate and Rhythm: Normal rate and regular rhythm.     Heart sounds: Normal heart sounds.  Pulmonary:     Effort: Pulmonary effort is normal.     Breath sounds: Wheezing present.  Abdominal:     General: There is no distension.     Palpations: Abdomen is soft.     Tenderness: There is no abdominal tenderness.  Musculoskeletal: Normal range of motion.  Skin:    General: Skin is warm and dry.  Neurological:     Mental Status: She is alert and oriented to person, place, and time.  Psychiatric:        Judgment: Judgment normal.      ED Treatments / Results  Labs (all labs ordered are listed, but only abnormal results are displayed) Labs Reviewed  CBC WITH DIFFERENTIAL/PLATELET - Abnormal; Notable for the following components:      Result Value   WBC 14.4 (*)    RDW 17.0 (*)    Neutro Abs 8.6 (*)    Monocytes Absolute 1.2 (*)    Eosinophils Absolute 0.8 (*)    Abs Immature Granulocytes 0.13 (*)    All other components within normal limits  BASIC METABOLIC PANEL - Abnormal; Notable for the following components:   Calcium 8.6 (*)    All other components within normal limits  SARS CORONAVIRUS 2 (HOSPITAL ORDER, [facility])  I-STAT BETA HCG BLOOD, ED ([facility], [facility], [facility] ONLY)    EKG None   Radiology Dg Chest Portable 1 View  Result Date: 10/05/2018 CLINICAL DATA:  Shortness of breath, cough EXAM: PORTABLE CHEST  1 VIEW COMPARISON:  08/02/2018 FINDINGS: Heart is borderline in size. Lungs clear. No effusions. No acute bony abnormality. IMPRESSION: No active disease. Electronically Signed   By: Rolm Baptise M.D.   On: 10/05/2018 12:09    Procedures Procedures (including critical care time)  Medications Ordered in ED Medications  albuterol (VENTOLIN HFA) 108 (90 Base) MCG/ACT inhaler 4 puff (has no administration in time range)  ipratropium (ATROVENT HFA) inhaler 2 puff (has no administration in time range)  predniSONE (DELTASONE) tablet 60 mg (has no administration in time range)     Initial Impression / Assessment and Plan / ED Course  I have reviewed the triage vital signs and the nursing notes.  Pertinent labs & imaging results that were available during my care of the patient were reviewed by me and considered in my medical decision making (see chart for details).        X-ray without abnormality.  Bronchodilators and Atrovent and prednisone now.  Patient will be checked for COVID-19.  Otherwise suspect asthma exacerbation.  Hopefully she can improve with oral medications and oral prednisone and be safely discharged from the emergency department.  Care will be transferred to oncoming provider at 1500  Final Clinical Impressions(s) / ED Diagnoses   Final diagnoses:  None    ED Discharge Orders    None       Jola Schmidt, MD 10/05/18 [phone]

## 2018-10-05 NOTE — Discharge Instructions (Signed)
1.  Your coronavirus testing was negative.  You should still continue to self isolate and proceed with extreme caution. 2.  Make an appointment to see your pulmonologist again as soon as possible for recheck. 3.  Take prednisone as prescribed.  Your first dose was given in the emergency department.  You may start tomorrow. 4.  Use your albuterol and Atrovent nebulizer every 4-6 hours. 5.  Immediately return to the emergency department if you feel that your breathing is worsening or other concerning symptoms.

## 2018-10-05 NOTE — Patient Instructions (Signed)
Transport to ER via EMS 

## 2018-10-05 NOTE — Progress Notes (Signed)
@Patient  ID: April [name], female    DOB: [birth year], 48 y.o.   MRN: [id number]  Chief Complaint  Patient presents with  . Acute Visit    Asthma     Referring provider: [name], MD  HPI: 48 year old female former smoker followed for asthma and COPD. Medical history significant for diastolic heart failure, A. fib, sleep apnea  TEST/EVENTS :  NPSG 03/31/07- AHI 5.8/ hr, weight 330 lbs. She canceled planned more recent study. Office spirometry 10/24/14- severe obstructive airways disease. FVC 2.10/66%, FEV1 1.25/47%, FEV1/FVC 0.59, FEF 25-75 percent 0.64/19% Unattended HST 07/06/16- AHI 11/hour, desaturation to 74%. Office Spirometry 07/29/2016-moderately severe obstructive airways disease. FVC 2.21/68%, FEV1 1.53/57%, ratio 0.69, FEF 25-75% 0.98/34% Prolonged hosp [facility] 4/19- tracheostomy.  10/05/2018 Acute OV : Asthma/Dyspnea  Patient presents for an acute office visit for Dr. Annamaria Boots . APP called to see patient due to acute respiratory symptoms  Patient complains over the last week that she has had worsening breathing, shortness of breath, cough, wheezing.  Patient went to the emergency room a few nights ago but did not stay due to the lengthy wait.  She says her breathing has been getting progressively worse.  Patient has a complicated medical history including chronic severe asthma.  Complicated hospitalization in April 2019 requiring prolonged vent support, tracheostomy and long rehab stay. Due to the patient's acute respiratory symptoms patient was determined to need higher level of care and evaluation.  Discussed with patient patient was monitored and EMS was contacted for transport to the emergency room for further evaluation and treatment options Recent hospitalization in April 2020 with Coronavirus (not COVID 19 -although not tested)    Allergies  Allergen Reactions  . Contrast Media [Iodinated Diagnostic Agents] Itching    Ct contrast    Immunization History   Administered Date(s) Administered  . Influenza Split 01/08/2012  . Influenza Whole 03/03/2009, 12/27/2009, 12/19/2010  . Influenza,inj,Quad PF,6+ Mos 12/29/2012, 12/31/2013, 03/07/2015, 01/23/2016, 01/06/2017  . PPD Test 09/25/2011, 07/17/2012, 06/25/2013, 05/21/2016, 11/11/2017  . Pneumococcal Polysaccharide-23 03/17/2009, 07/06/2016, 10/20/2016  . Tdap 12/19/2010    Past Medical History:  Diagnosis Date  . Acanthosis nigricans   . Anxiety   . Arthritis    "knees" (04/28/2017)  . Asthma    Followed by Dr. Annamaria Boots (pulmonology); receives every other week omalizumab injections; has frequent exacerbations  . Chronic diastolic CHF (congestive heart failure) ([place]) 01/17/2017  . COPD (chronic obstructive pulmonary disease) ([place])    PFTs in 2002, FEV1/FVC 65, no post bronchodilater test done  . Depression   . GERD (gastroesophageal reflux disease)   . Headache(784.0)    "q couple days" (04/28/2017)  . Helicobacter pylori (H. pylori) infection   . Hypertension, essential   . Insomnia   . Menorrhagia   . Morbid obesity ([place])   . OSA on CPAP    Sleep study 2008 - mild OSA, not enough events to titrate CPAP; wears CPAP now/pt on 04/28/2017  . Pneumonia X 1  . Seasonal allergies   . Shortness of breath   . Tobacco user     Tobacco History: Social History   Tobacco Use  Smoking Status Former Smoker  . Packs/day: 0.50  . Years: 26.00  . Pack years: 13.00  . Types: Cigarettes  . Last attempt to quit: 09/12/2014  . Years since quitting: 4.0  Smokeless Tobacco Never Used   Counseling given: Not Answered   Outpatient Medications Prior to Visit  Medication Sig Dispense Refill  . albuterol (  PROVENTIL HFA;VENTOLIN HFA) 108 (90 Base) MCG/ACT inhaler Inhale 2 puffs into the lungs every 4 (four) hours as needed for wheezing or shortness of breath. 1 Inhaler 3  . albuterol (PROVENTIL) (2.5 MG/3ML) 0.083% nebulizer solution Take 3 mLs (2.5 mg total) by nebulization every 6 (six) hours as  needed for wheezing or shortness of breath. 75 mL 1  . apixaban (ELIQUIS) 5 MG TABS tablet Take 1 tablet (5 mg total) by mouth 2 (two) times daily. 60 tablet 3  . cetirizine (ZYRTEC) 10 MG tablet Take 10 mg by mouth daily.    . DULoxetine (CYMBALTA) 60 MG capsule Take 1 capsule (60 mg total) by mouth daily. (Patient not taking: Reported on 09/24/2018) 30 capsule 3  . ergocalciferol (DRISDOL) 1.25 MG (50000 UT) capsule Take 1 capsule (50,000 Units total) by mouth once a week. 9 capsule 1  . Ferrous Sulfate (IRON) 325 (65 Fe) MG TABS Take 1 tablet (325 mg total) by mouth every morning. (Patient taking differently: Take 1 tablet by mouth 2 (two) times daily. ) 30 each 0  . fluticasone (FLONASE) 50 MCG/ACT nasal spray Place 1 spray into both nostrils daily. 30 g 2  . furosemide (LASIX) 40 MG tablet Take 1 tablet (40 mg total) by mouth daily. 30 tablet 1  . gabapentin (NEURONTIN) 300 MG capsule Take 2 capsules (600 mg total) by mouth 2 (two) times daily. 120 capsule 3  . guaiFENesin (MUCINEX) 600 MG 12 hr tablet Take 2 tablets (1,200 mg total) by mouth 2 (two) times daily. (Patient not taking: Reported on 08/12/2018) 14 tablet 0  . hydrALAZINE (APRESOLINE) 10 MG tablet Take 1 tablet (10 mg total) by mouth every 8 (eight) hours for 30 days. 90 tablet 1  . ipratropium-albuterol (DUONEB) 0.5-2.5 (3) MG/3ML SOLN Inhale 3 ml two times daily and every 2 hours as needed for shortness of breath or wheezing Inhale 3 ml two times daily and every 2 hours as needed for shortness of breath or wheezing (Patient taking differently: Inhale 3 mLs into the lungs every 2 (two) hours as needed (wheezing/shortness of breath). ) 360 mL 0  . metoprolol tartrate (LOPRESSOR) 50 MG tablet Take 1 tablet (50 mg total) by mouth 2 (two) times daily. 60 tablet 3  . mometasone-formoterol (DULERA) 200-5 MCG/ACT AERO Inhale 2 puffs into the lungs 2 (two) times daily. 1 Inhaler 3  . montelukast (SINGULAIR) 10 MG tablet Take 1 tablet (10 mg  total) by mouth at bedtime. 30 tablet 3  . nicotine (NICODERM CQ - DOSED IN MG/24 HOURS) 21 mg/24hr patch Place 1 patch (21 mg total) onto the skin daily. (Patient not taking: Reported on 08/12/2018) 28 patch 0  . omeprazole (PRILOSEC) 20 MG capsule Take 1 capsule (20 mg total) by mouth daily. 30 capsule 3  . oxyCODONE-acetaminophen (PERCOCET) 10-325 MG tablet Take 1 tablet by mouth every 4 (four) hours as needed for pain.    . potassium chloride SA (K-DUR,KLOR-CON) 20 MEQ tablet Take 1 tablet (20 mEq total) by mouth daily. 30 tablet 3  . predniSONE (DELTASONE) 10 MG tablet 6,5,4,3,2,1 take each days dose all at once with food in the morning 21 tablet 0  . zolpidem (AMBIEN) 5 MG tablet TAKE 1 TABLET(5 MG) BY MOUTH AT BEDTIME (Patient taking differently: Take 5 mg by mouth at bedtime. ) 30 tablet 2   No facility-administered medications prior to visit.      Review of Systems:   Constitutional:   No  weight loss, night  sweats,  Fevers, chills,  +fatigue, or  lassitude.  HEENT:   No headaches,  Difficulty swallowing,  Tooth/dental problems, or  Sore throat,                No sneezing, itching, ear ache, nasal congestion, post nasal drip,   CV:  No chest pain,  Orthopnea, PND, swelling in lower extremities, anasarca, dizziness, palpitations, syncope.   GI  No heartburn, indigestion, abdominal pain, nausea, vomiting, diarrhea, change in bowel habits, loss of appetite, bloody stools.   Resp: ++ shortness of breath with exertion or at rest.  No excess mucus, no productive cough,  + non-productive cough,  No coughing up of blood.  No change in color of mucus.  + wheezing.  No chest wall deformity  Skin: no rash or lesions.  GU: no dysuria, change in color of urine, no urgency or frequency.  No flank pain, no hematuria   MS:  No joint pain or swelling.  No decreased range of motion.  No back pain.    Physical Exam: Limited exam   Pulse 91   Resp (!) 24   SpO2 97%   GEN: A/Ox3;  morbid  obesity .     RESP  +accessory muscle use, tachypnea    Neuro: alert, oriented x 3        Lab Results:  CBC      Assessment & Plan:   Moderate persistent asthma with exacerbation Suspected exacerbation - with dyspnea , cough and wheezing . Patient with increased wob , +accessory use.  O2 sats are stable . Patient has multiple co-morbidiites and previous resp failure with trach .  Due to COVID 19 office/staff/patient precautions   -patient will need further evaluation and COVID 19 screening . She was monitored closely and EMS called with arrival to office with report given by Drakkar Medeiros NP  EMS transport to ER .        Rexene Edison, NP 10/05/2018

## 2018-10-05 NOTE — ED Notes (Signed)
Called and requested Atrovent inhaler from Pharmacy. Not available in floor pyxis. Was told pharmacy will tube.

## 2018-10-05 NOTE — ED Notes (Signed)
Called and spoke with pharmacy and requested Atrovent Inhaler.

## 2018-10-05 NOTE — ED Notes (Signed)
Bed: WB91 Expected date:  Expected time:  Means of arrival:  Comments: EMS COVID rule out from PCP

## 2018-10-08 ENCOUNTER — Encounter: Payer: Self-pay | Admitting: Internal Medicine

## 2018-10-08 ENCOUNTER — Other Ambulatory Visit: Payer: Self-pay

## 2018-10-08 ENCOUNTER — Ambulatory Visit (INDEPENDENT_AMBULATORY_CARE_PROVIDER_SITE_OTHER): Payer: Medicare Other | Admitting: Internal Medicine

## 2018-10-08 ENCOUNTER — Ambulatory Visit: Payer: BLUE CROSS/BLUE SHIELD | Admitting: Internal Medicine

## 2018-10-08 DIAGNOSIS — Z9989 Dependence on other enabling machines and devices: Secondary | ICD-10-CM

## 2018-10-08 DIAGNOSIS — J4550 Severe persistent asthma, uncomplicated: Secondary | ICD-10-CM | POA: Diagnosis not present

## 2018-10-08 DIAGNOSIS — J4541 Moderate persistent asthma with (acute) exacerbation: Secondary | ICD-10-CM

## 2018-10-08 DIAGNOSIS — G4733 Obstructive sleep apnea (adult) (pediatric): Secondary | ICD-10-CM | POA: Diagnosis not present

## 2018-10-08 NOTE — Patient Instructions (Signed)
Continue regular use of your Dulera and use your nebulizer machine at least twice daily  Finish the prednisone on Saturday as planned  Call us Monday to let us know how you are doing off prednisone.   If possible, I would like to try to keep you off prednisone for a week or two so we can draw some lab work  I suggested you try otc CBD oil (from hemp) drops under your tongue at bedtime to help with sleep.  Order- schedule home sleep test dx OSA  Order- schedule future labs in 2 weeks- IgE, CBC w diff     Dx Asthma severe persistent

## 2018-10-08 NOTE — Progress Notes (Signed)
Patient ID: April [name], female    DOB: 08-23-1970, 48 y.o.   MRN: [id number]   HPI F former smoker followed for chronic severe asthma/ Xolair complicated by non-compliance,  OSA/ CPAP,  morbid obesity, HBP, depression, GERD, AFib/ Eliquis  NPSG 03/31/07- AHI 5.8/ hr, weight 330 lbs. She canceled planned more recent study. Office spirometry 10/24/14- severe obstructive airways disease. FVC 2.10/66%, FEV1 1.25/47%, FEV1/FVC 0.59, FEF 25-75 percent 0.64/19% Unattended HST 07/06/16- AHI 11/hour, desaturation to 74%. Office Spirometry 07/29/2016-moderately severe obstructive airways disease. FVC 2.21/68%, FEV1 1.53/57%, ratio 0.69, FEF 25-75% 0.98/34% Prolonged hosp [facility] 4/19- tracheostomy. ------------------------------------------------------- 11/07/2017- 48 year old female former smoker followed for chronic severe asthma/Xolair, complicated by noncompliance, OSA/CPAP, morbid obesity, HBP, depression, GERD CPAP AutoPap 5-20/Advanced -----Asthma: Pt has had several ED/hospital visits since last OV 07/14/17. Currently at [facility].  Advair 250, Flonase, Zyrtec, Singulair, neb DuoNeb, Complicated medical history since last seen.  As I reconstructed, she was hospitalized in April at [facility]-tracheostomy/intubation, then discharged to rehab, then home in early June.  Returned back to [facility] and now staying at [facility].  Expects to go home in a week or so.  Is not using oxygen.  Breathing much better- feels comfortable.  Uses rescue inhaler and nebulizer machine each once or twice a week while continuing Advair.  Little sputum, no fever or chest pain.  Remains very weak with limited walking. She describes much nausea and reflux or regurgitation with some weight loss. CXR 10/14/2017 IMPRESSION: No acute cardiopulmonary abnormality seen  Review of Systems-See HPI  + = positive Constitutional:   + weight loss, night sweats, fevers, chills, +fatigue, lassitude. HEENT:   No-   headaches, difficulty swallowing, tooth/dental problems, sore throat,    sneezing, itching, ear ache, +nasal congestion, post nasal drip,  CV:  No-   chest pain, orthopnea, PND, swelling in lower extremities, anasarca, dizziness, palpitations Resp: +shortness of breath with exertion or at rest.              productive cough,  + non-productive cough,  No- coughing up of blood.              No-   change in color of mucus. +wheezing.   Skin: No-   rash or lesions. GI:  No-   heartburn, indigestion, abdominal pain, nausea, vomiting,  GU: MS:  No-   joint pain or swelling.   Neuro-     nothing unusual Psych:  No- change in mood or affect. No depression or anxiety.  No memory loss.    Objective:   Physical Exam   + wheelchair General- Alert, Oriented, Affect-appropriate, Distress- none acute, + morbid  obesity,  Skin- rash-none, lesions- none, excoriation- none Lymphadenopathy- none Head- atraumatic            Eyes- Gross vision intact, PERRLA, conjunctivae clear secretions            Ears- Hearing, canals-normal            Nose- Clear, no-Septal dev, mucus, polyps, erosion, perforation. + sniffing            Throat- Mallampati III , mucosa clear , drainage- none, tonsils- atrophic,                           + Missing  teeth, +upper denture plate is out  Neck- flexible , trachea+ stoma scar, no stridor , thyroid nl, carotid no bruit Chest - symmetrical excursion , unlabored           Heart/CV- RRR , no murmur , no gallop  , no rub, nl s1 s2                           - JVD- none , edema- none, stasis changes- none, varices- none           Lung- +Diminished/ clear now, unlabored.  Wheeze -None, cough-none,                                               dullness-none,  rub- none           Chest wall-  Abd-  Br/ Gen/ Rectal- Not done, not indicated Extrem- cyanosis- none, clubbing, none, atrophy- none, strength- nl Neuro- grossly intact to observation    10/08/2018- Virtual  Visit via Telephone Note  I connected with April [name] on 10/08/18 at 10:00 AM EDT by telephone and verified that I am speaking with the correct person using two identifiers.  Location: Patient: hme Provider: office   I discussed the limitations, risks, security and privacy concerns of performing an evaluation and management service by telephone and the availability of in person appointments. I also discussed with the patient that there may be a patient responsible charge related to this service. The patient expressed understanding and agreed to proceed.   History of Present Illness: 48 year old female former smoker followed for chronic severe asthma/Xolair, Tracheostomy at [facility],  complicated by noncompliance, OSA/CPAP, morbid obesity, HBP, depression, GERD CPAP AutoPap 5-20/Advanced- not using. C/O insomnia and agrees to update NPSG in future. Frequent WASO. Asks Ambien 10 mg- otc no help, failed Trazodone. Try CBD oil. Presented for ov last week with exacerb asthma, eval by NP and sent to ED. Nasal [place] test was neg Was assessed as improved at ED, given neb Rx and sent home on pred x 5 days.  -----pt states breathing is much better since ED visit,  Still tapering prednisone and feels she can't stay off it. Asking retry Xolair, stating she will be compliant this time because motivated to be off prednisone. Has Dulera and neb. CXR  09/04/2018- No active disease   Observations/Objective:   Assessment and Plan: Asthma- steroid dependent. Motivated to retry Xolair. P;an- IgE, Eos (off prednisone if possible). OSA- noncompliant w CPAP in past- Plan- update sleep study Follow Up Instructions:    I discussed the assessment and treatment plan with the patient. The patient was provided an opportunity to ask questions and all were answered. The patient agreed with the plan and demonstrated an understanding of the instructions.   The patient was advised to call back or seek an in-person  evaluation if the symptoms worsen or if the condition fails to improve as anticipated.  I provided 21 minutes of non-face-to-face time during this encounter.   Baird Lyons, MD

## 2018-10-09 ENCOUNTER — Telehealth: Payer: Self-pay | Admitting: Internal Medicine

## 2018-10-09 MED ORDER — PREDNISONE 10 MG PO TABS: ORAL_TABLET | ORAL | 0 refills | Status: DC

## 2018-10-09 NOTE — Telephone Encounter (Signed)
Pt is calling back [phone]

## 2018-10-09 NOTE — Telephone Encounter (Signed)
Spoke with pt, she states she states she needs a refill on prednisone because she feels like she would be in the hospital this weekend if she doesn't. I advised her that CY was wanting her to stay off the prednisone but she feels that it is not a good idea. She is having more SOB and she has more trouble in the mornings and she is afraid that she will wake up and not be able to breathe. She really doesn't want to continue on it but feels like she needs it or she will go into a flare. Please advise, CY has left for the day. Thanks

## 2018-10-09 NOTE — Telephone Encounter (Signed)
Instructions from OV with CY 6/11:    Return in about 4 months (around 02/07/2019). Continue regular use of your Dulera and use your nebulizer machine at least twice daily  Finish the prednisone on Saturday as planned  Call us Monday to let us know how you are doing off prednisone.   If possible, I would like to try to keep you off prednisone for a week or two so we can draw some lab work  I suggested you try otc CBD oil (from hemp) drops under your tongue at bedtime to help with sleep.  Order- schedule home sleep test dx OSA  Order- schedule future labs in 2 weeks- IgE, CBC w diff     Dx Asthma severe persistent     Attempted to call pt but unable to reach.left message for pt to return call.

## 2018-10-09 NOTE — Telephone Encounter (Signed)
Spoke with pt and informed her the prednisone was sent to preferred pharmacy.  Forward message to Dr Annamaria Boots so you are aware of the situation.

## 2018-10-09 NOTE — Telephone Encounter (Signed)
I will send in additonal prednisone 20mg  x 5 days to take only if her asthma symptoms exacerbation after finishing taper. Follow up with Dr. Annamaria Boots.

## 2018-10-12 ENCOUNTER — Telehealth: Payer: Self-pay | Admitting: Internal Medicine

## 2018-10-12 NOTE — Telephone Encounter (Signed)
ATC pt to assess if she is still taking prednisone and to set up a day for her to come in the office and get labs drawn per CY. I have left a message for pt to call back.

## 2018-10-13 NOTE — Telephone Encounter (Signed)
Returned call to patient. She took the last of her prednisone today. She is aware the CY would like for her to have labs drawn. She says she is still having cough and sob.

## 2018-10-13 NOTE — Telephone Encounter (Signed)
Attempted to call pt line went straight to VM. Left message for pt to return call.

## 2018-10-13 NOTE — Telephone Encounter (Signed)
Patient is returning phone call. Patient phone number is [phone]. ?

## 2018-10-13 NOTE — Telephone Encounter (Signed)
Use nebulizer every 4 hours if needed. Try to stay off prednisone at least until Thursday. Order future labs- CBC w diff and total IgE, for dx asthma severe, complicated  To be drawn at least 2 days after last prednisone- longer if possible.

## 2018-10-13 NOTE — Telephone Encounter (Signed)
Pt calling back a/b predisone.April [name]

## 2018-10-13 NOTE — Telephone Encounter (Signed)
ATC patient.  Left message for Patient to call back. 

## 2018-10-14 NOTE — Telephone Encounter (Signed)
I attempted to call pt w/ these recommendations. Pt did not answer at the time of the call. I have left a message for her to call the office back.   Labs for CBC w/ diff & total IgE have already been placed 10/08/2018. Pt will need to be informed to come in tomorrow 10/15/2018 to get these labs drawn, according to CY's recommendations.   Will keep encounter open to f/u on when pt calls back.

## 2018-10-15 ENCOUNTER — Other Ambulatory Visit: Payer: Self-pay | Admitting: Internal Medicine

## 2018-10-15 ENCOUNTER — Other Ambulatory Visit (INDEPENDENT_AMBULATORY_CARE_PROVIDER_SITE_OTHER): Payer: Medicare Other

## 2018-10-15 DIAGNOSIS — J4550 Severe persistent asthma, uncomplicated: Secondary | ICD-10-CM | POA: Diagnosis not present

## 2018-10-15 MED ORDER — PREDNISONE 10 MG PO TABS: ORAL_TABLET | ORAL | 0 refills | Status: DC

## 2018-10-15 NOTE — Telephone Encounter (Signed)
Ok thanks. Rx for prednisone sent into pharmacy.

## 2018-10-15 NOTE — Telephone Encounter (Signed)
Ok to refill prednisone  Since she can't stay off oral steroids, I am going to start the application process to see if we can get her approved for Dupixent injections, which would be given here at the office. I had told her we might want to do this.  I will place the order to get the application started.

## 2018-10-15 NOTE — Telephone Encounter (Signed)
Pt called office back. I spoke with her regarding the information below. Pt verbalized understanding and agreed to starting the [place] application process.   Pt stated she is currently on the way to get her labs drawn. She agreed to signing the enrollment form after her labs are drawn today 10/15/2018.  Keeping encounter open to update once this form is signed.

## 2018-10-15 NOTE — Telephone Encounter (Signed)
CY, pt is coming in for labs today and is requesting prednisone Rx called to [facility]. Please advise if you would be ok with sending a prednisone Rx for this pt. Thank you.

## 2018-10-15 NOTE — Telephone Encounter (Signed)
CY just to make sure, it is ok to send in prednisone? Patient is coming in today for labs but still requesting Rx for prednisone.

## 2018-10-15 NOTE — Telephone Encounter (Signed)
Pt came into office today 10/15/2018 for labs and to sign Dupixent form. Pt's insurance cards were also scanned into chart during visit.   We have everything we need from pt to proceed with Dupixent enrollment process. Nothing further needed at this time.

## 2018-10-15 NOTE — Telephone Encounter (Addendum)
Spoke with pt in office and advised her that the Rx was sent in. I also advised her about starting the Dupixent injections. Raquel Sarna your encounter is incomplete, unable to close.

## 2018-10-15 NOTE — Telephone Encounter (Signed)
Pt called to state she was coming in today for labs that are ordered but needs prednisone as well called in to [facility].

## 2018-10-15 NOTE — Telephone Encounter (Addendum)
I attempted to call pt to inform her of the prednisone Rx and to inquire about starting the [place] application process.   CY stated he spoke w/ pt at [place] about possibly starting Dupixent injection. I have the enrollment form ready should pt agree to these measures. We need her confirmation, signature, and insurance cards to complete this process initial process, so I was planning on offering her an OV in one of CY's RNA spots OR setting a day for her to come by the office and sign the form/provide her most recent insurance cards for [place] to make a copy of.   Will keep this encounter open to f/u on.

## 2018-10-16 LAB — CBC WITH DIFFERENTIAL/PLATELET
Basophils Absolute: 0.2 10*3/uL — ABNORMAL HIGH (ref 0.0–0.1)
Basophils Relative: 1.1 % (ref 0.0–3.0)
Eosinophils Absolute: 0.8 10*3/uL — ABNORMAL HIGH (ref 0.0–0.7)
Eosinophils Relative: 4.9 % (ref 0.0–5.0)
HCT: 37.7 % (ref 36.0–46.0)
Hemoglobin: 12 g/dL (ref 12.0–15.0)
Lymphocytes Relative: 23.6 % (ref 12.0–46.0)
Lymphs Abs: 3.9 10*3/uL (ref 0.7–4.0)
MCHC: 31.8 g/dL (ref 30.0–36.0)
MCV: 87.8 fl (ref 78.0–100.0)
Monocytes Absolute: 1 10*3/uL (ref 0.1–1.0)
Monocytes Relative: 6.1 % (ref 3.0–12.0)
Neutro Abs: 10.5 10*3/uL — ABNORMAL HIGH (ref 1.4–7.7)
Neutrophils Relative %: 64.3 % (ref 43.0–77.0)
Platelets: 395 10*3/uL (ref 150.0–400.0)
RBC: 4.3 Mil/uL (ref 3.87–5.11)
RDW: 17.4 % — ABNORMAL HIGH (ref 11.5–15.5)
WBC: 16.4 10*3/uL — ABNORMAL HIGH (ref 4.0–10.5)

## 2018-10-16 LAB — IGE: IgE (Immunoglobulin E), Serum: 354 kU/L — ABNORMAL HIGH (ref <=114)

## 2018-10-19 ENCOUNTER — Other Ambulatory Visit: Payer: Self-pay | Admitting: Internal Medicine

## 2018-10-19 ENCOUNTER — Telehealth: Payer: Self-pay | Admitting: Internal Medicine

## 2018-10-19 NOTE — Telephone Encounter (Signed)
Prednisone refill e-sent. I have changed it so she can stay on daily prednisone for now.

## 2018-10-19 NOTE — Telephone Encounter (Signed)
    Patient Instructions by Deneise Lever, MD at 10/08/2018 10:00 AM Author: Deneise Lever, MD Author Type: Physician Filed: 10/08/2018 10:20 AM  Note Status: Signed Cosign: Cosign Not Required Encounter Date: 10/08/2018  Editor: Deneise Lever, MD (Physician)    Continue regular use of your [facility] and use your nebulizer machine at least twice daily  Finish the prednisone on Saturday as planned  Call us Monday to let us know how you are doing off prednisone.   If possible, I would like to try to keep you off prednisone for a week or two so we can draw some lab work  I suggested you try otc CBD oil (from hemp) drops under your tongue at bedtime to help with sleep.  Order- schedule home sleep test dx OSA  Order- schedule future labs in 2 weeks- IgE, CBC w diff     Dx Asthma severe persistent           Notes recorded by Deneise Lever, MD on 10/16/2018 at 2:29 PM EDT  Lab- IgE allergy marker is high. I do think we should go ahead with the Dupixent biologic therapy injections if we can.

## 2018-10-19 NOTE — Telephone Encounter (Signed)
Pt is calling back, Cb is [phone]

## 2018-10-19 NOTE — Telephone Encounter (Signed)
I initially called & spoke to pt about her recent IgE lab results from 10/15/2018 (see labs tab).   Pt inquired about Dupixent therapy, which CY has stated he would like to proceed with. CY and I are in the process of making final changes to the application to submit to [employer]. I let pt know of this, which she expressed understanding to.   Pt is also requesting additional prednisone to hold her over until these Dupixent injections can be started. I let her know I would put in a message to Dr. Annamaria Boots to see if he would be okay with prescribing additional prednisone until [place] can be started.   CY, please advise on your recommendations for this patient. Thank you.

## 2018-10-21 NOTE — Telephone Encounter (Signed)
Message routed to Dr Young. °

## 2018-10-22 ENCOUNTER — Telehealth: Payer: Self-pay | Admitting: Internal Medicine

## 2018-10-22 NOTE — Telephone Encounter (Signed)
Received Dupixent enrollment forms from [facility], LPN. Forms have been faxed to [place] [employer]. Will await summary of benefits.

## 2018-10-23 NOTE — Telephone Encounter (Signed)
CY - please advise. Thanks! 

## 2018-10-23 NOTE — Telephone Encounter (Signed)
Summary of benefits has been received.

## 2018-10-24 IMAGING — CR DG CHEST 2V
2 series · 2 of 2 positions shown · non-contrast
Comparison: 08/18/2017.

CLINICAL DATA: Chest pain with cough and vomiting.

EXAM:
CHEST - 2 VIEW

[x chest ap]
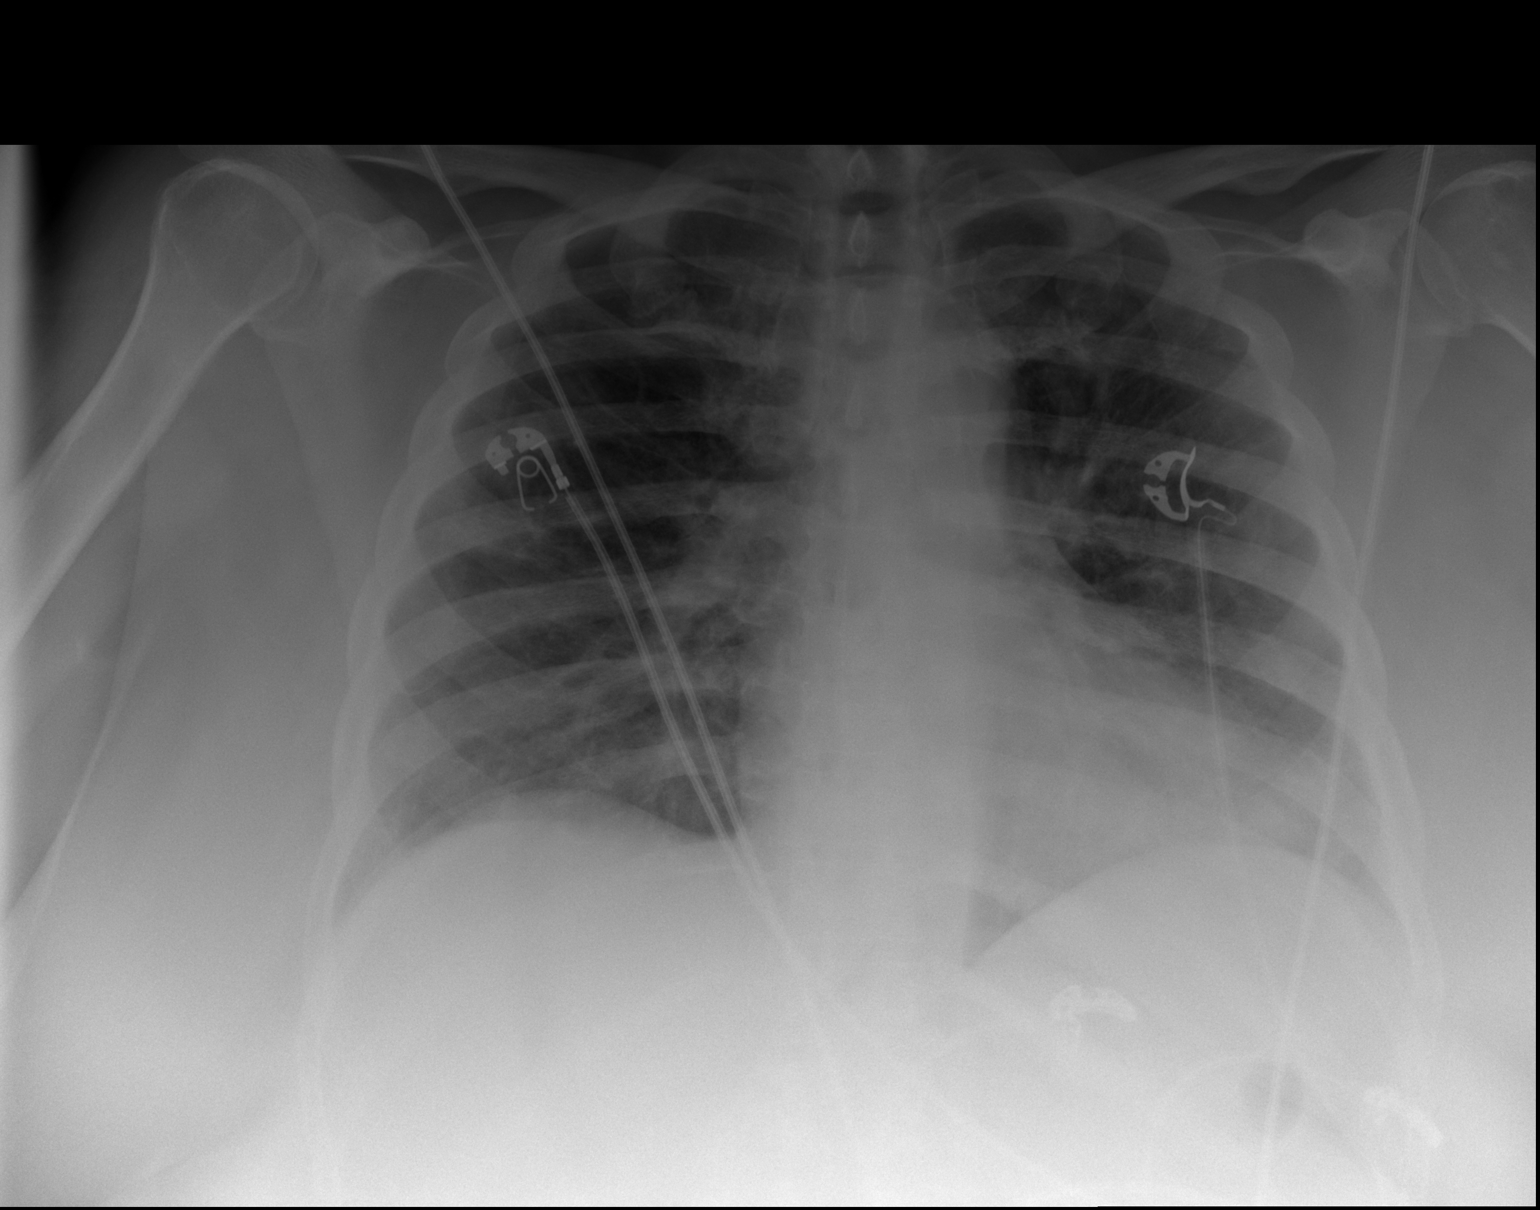

[w chest lat]
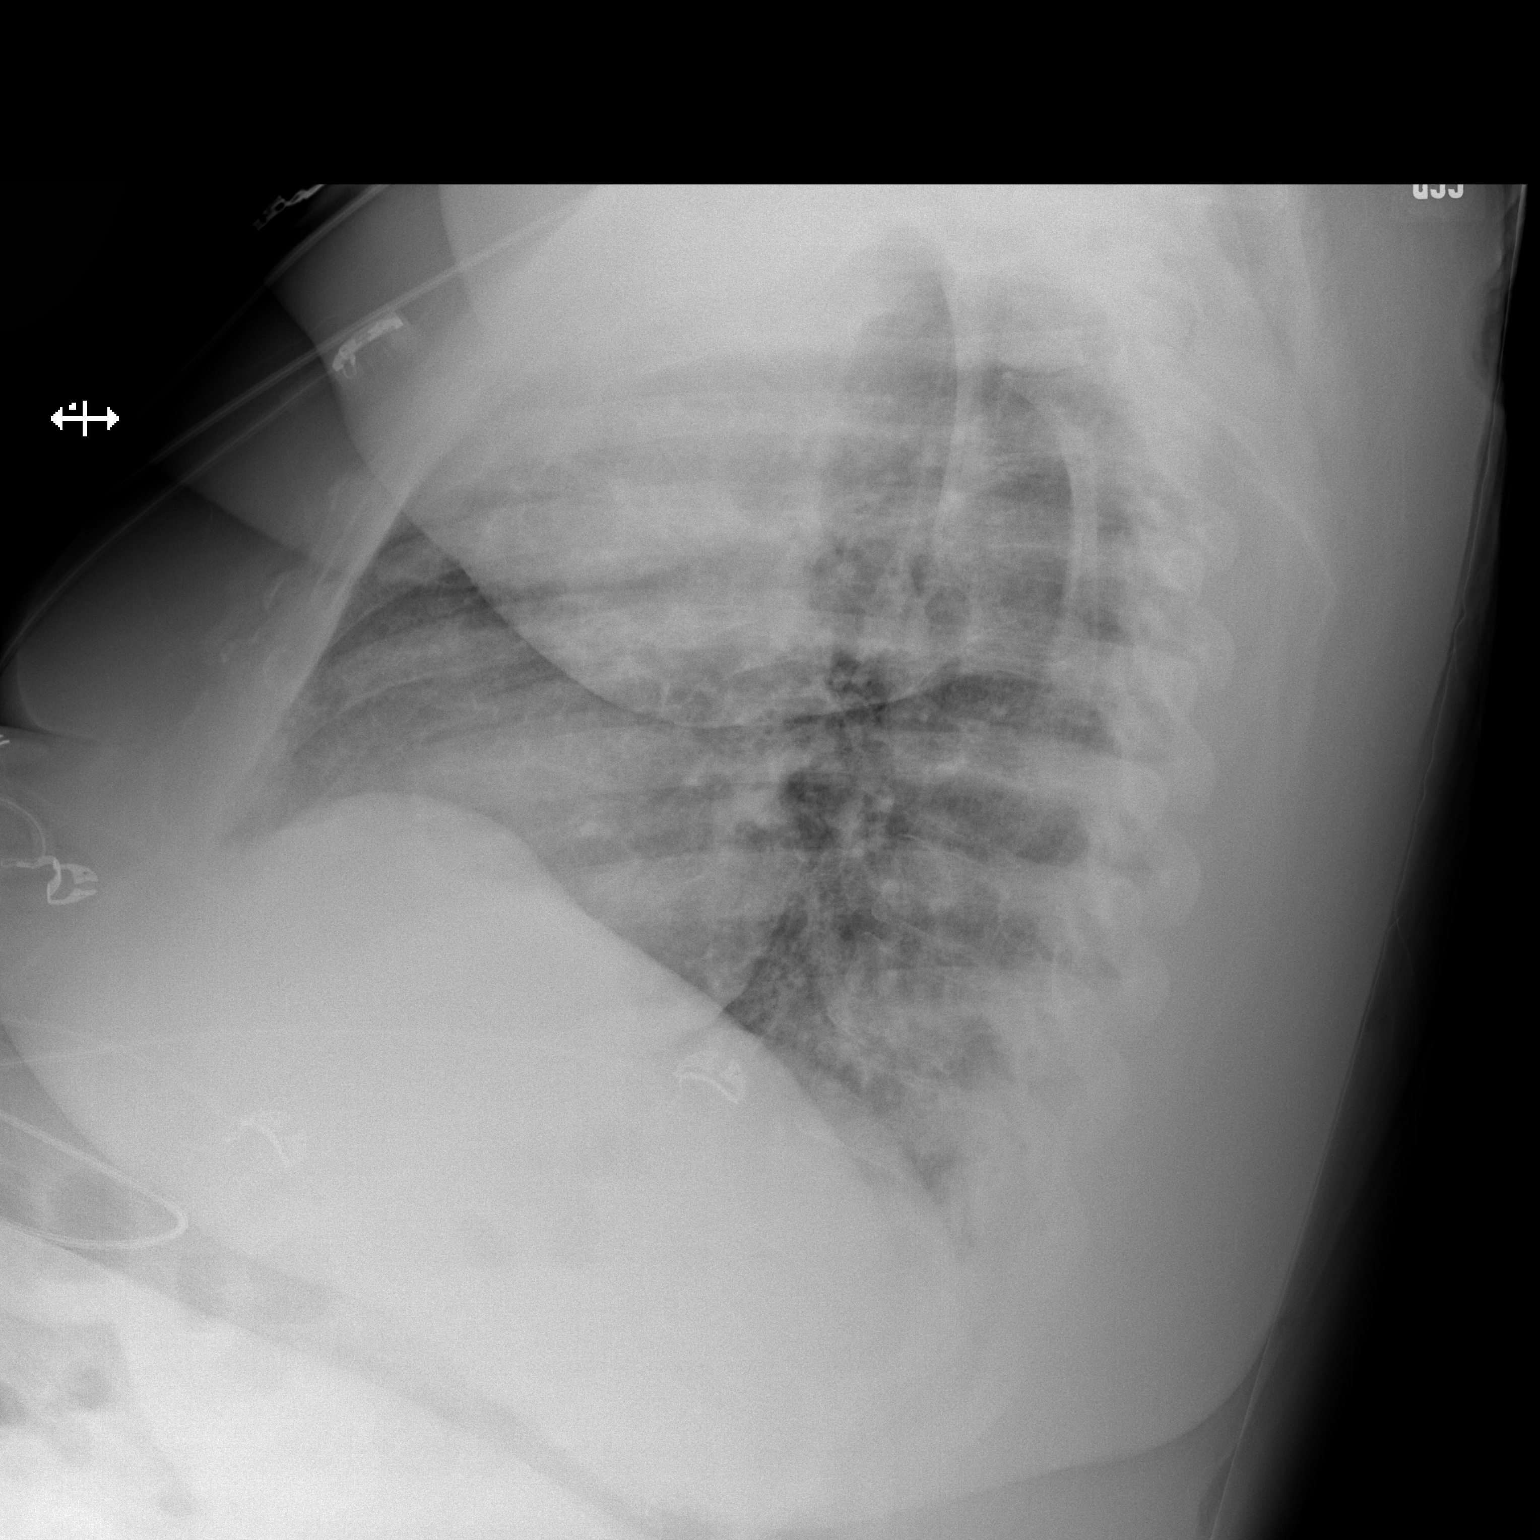

[2 of 2 positions shown; findings below may reference images not displayed]

FINDINGS: Cardiomegaly. Retrocardiac density, noted on the [place] radiograph,
appears to persist although is less prominent. The diffuse airspace
opacity noted previously is significantly improved. No bony
abnormality.
IMPRESSION: Cardiomegaly with overall improving aeration. There could be
residual LEFT lower lobe infiltrate.

## 2018-10-24 IMAGING — CT CT CHEST W/O CM
2 of 4 series · 15 of 36 positions shown, 18 images · non-contrast
Comparison: None.

CLINICAL DATA: Worsening chest pain for 2 months

EXAM:
CT CHEST WITHOUT CONTRAST
TECHNIQUE: Multidetector CT imaging of the chest was performed following the
standard protocol without IV contrast.

[Series 2: thorax · axial · 0.77mm/px · z∈[+1429,+1667]mm · 12 of 139 slices shown, 15 images]
[im 10/139  mediastinal]
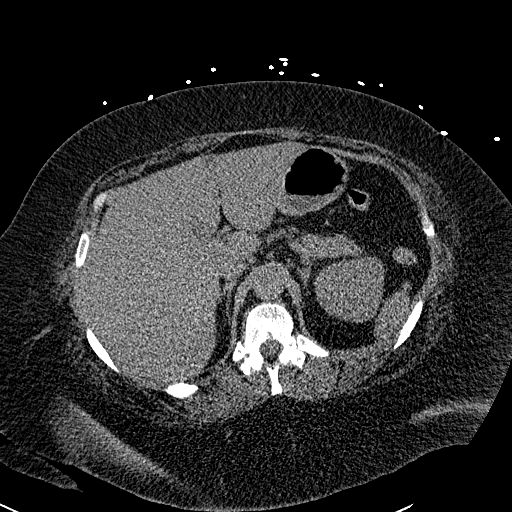
[im 10/139  lung]
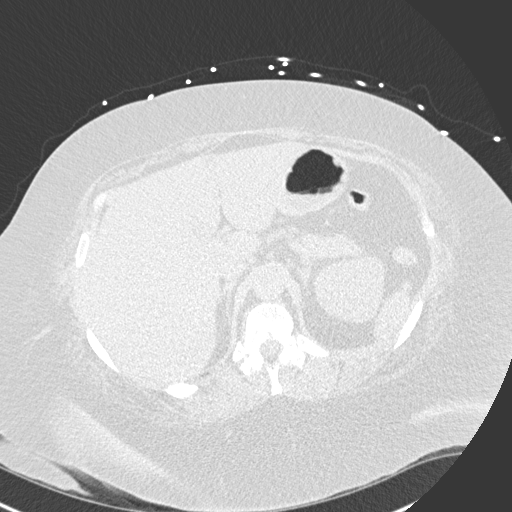
[im 20/139  lung]
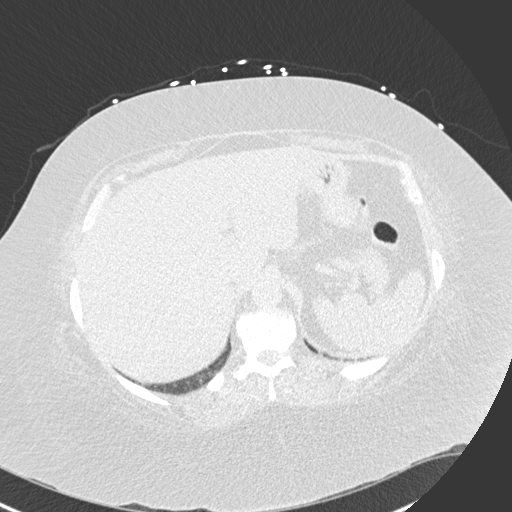
[im 30/139  lung]
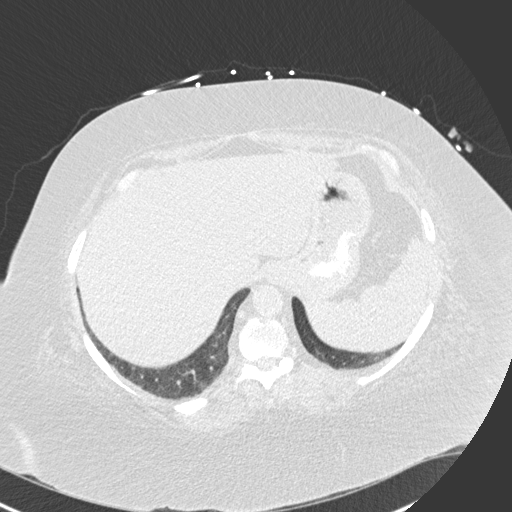
[im 40/139  lung]
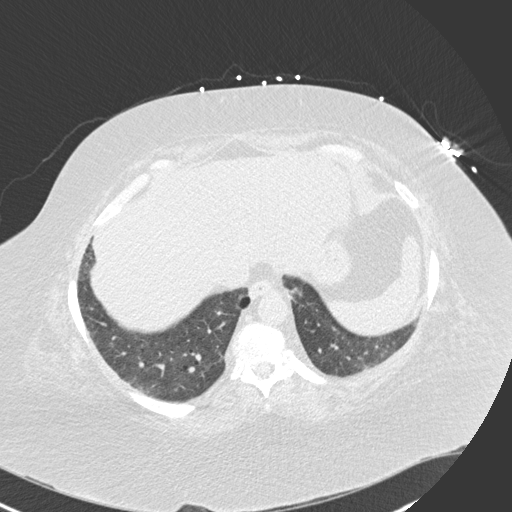
[im 50/139  mediastinal]
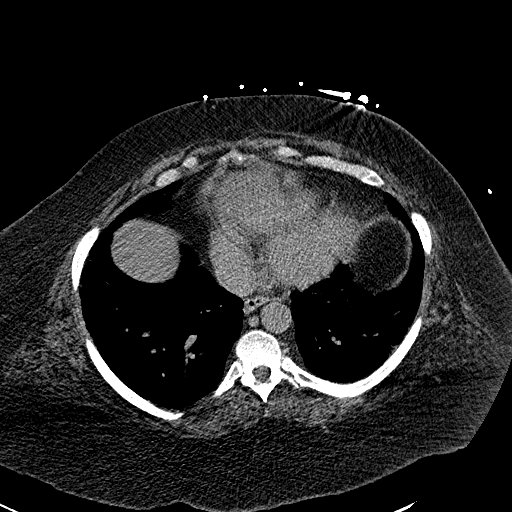
[im 50/139  lung]
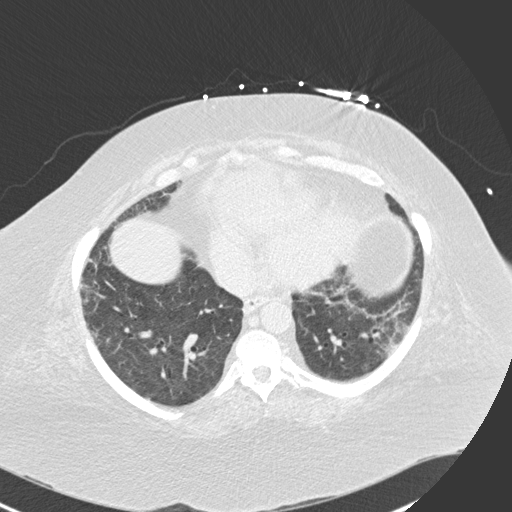
[im 60/139  lung]
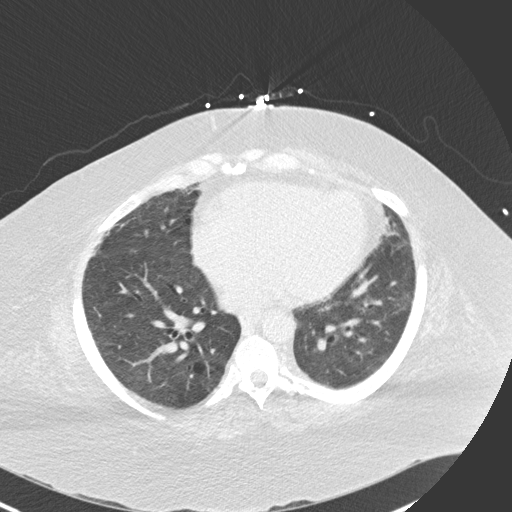
[im 79/139  lung]
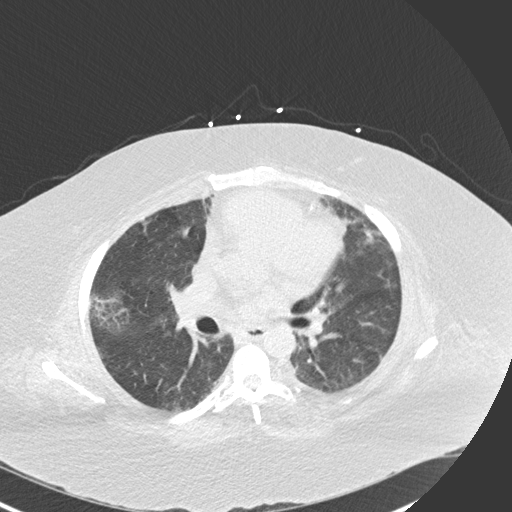
[im 89/139  lung]
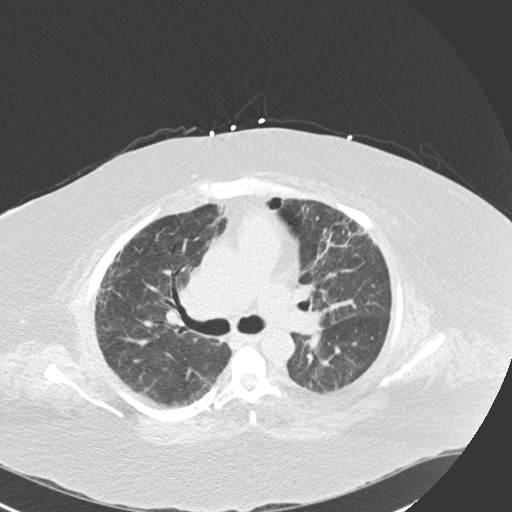
[im 99/139  mediastinal]
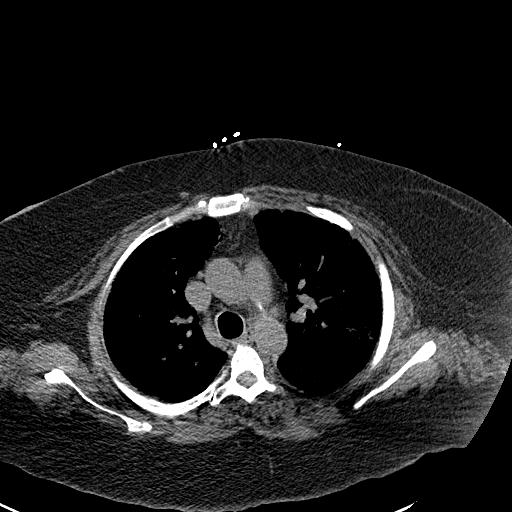
[im 99/139  lung]
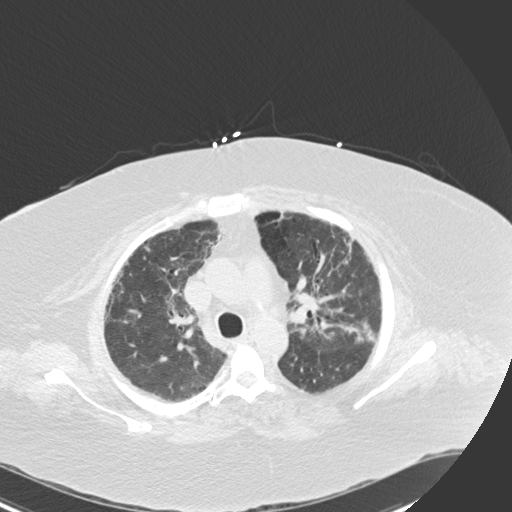
[im 109/139  lung]
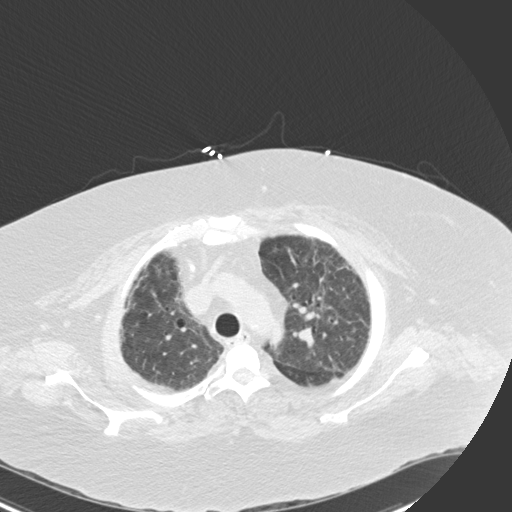
[im 119/139  lung]
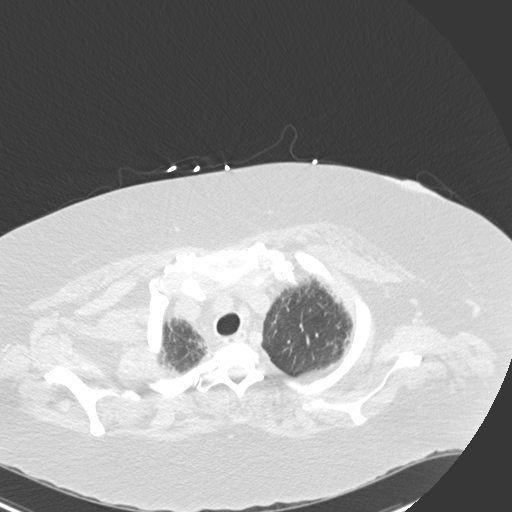
[im 129/139  lung]
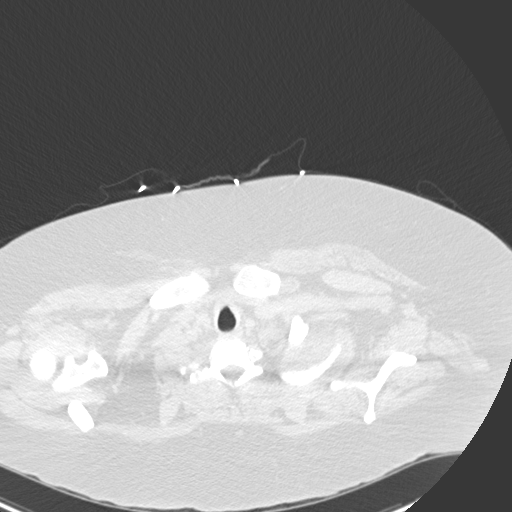

[Series 5: coronal · coronal · 0.63mm/px · 3 of 122 slices shown]
[im 25/122  lung]
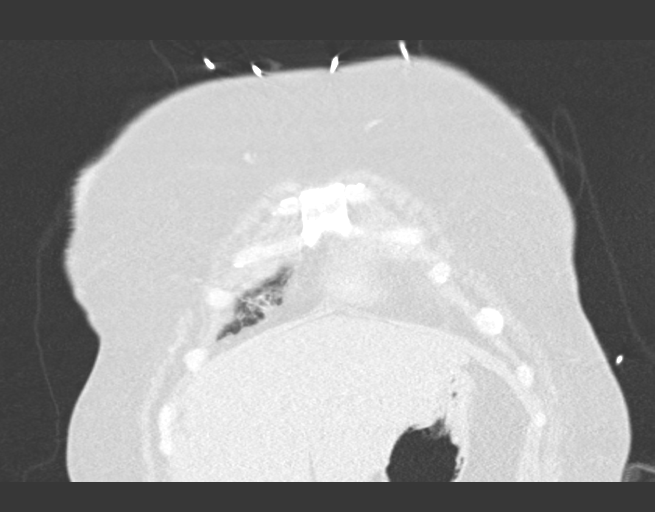
[im 49/122  lung]
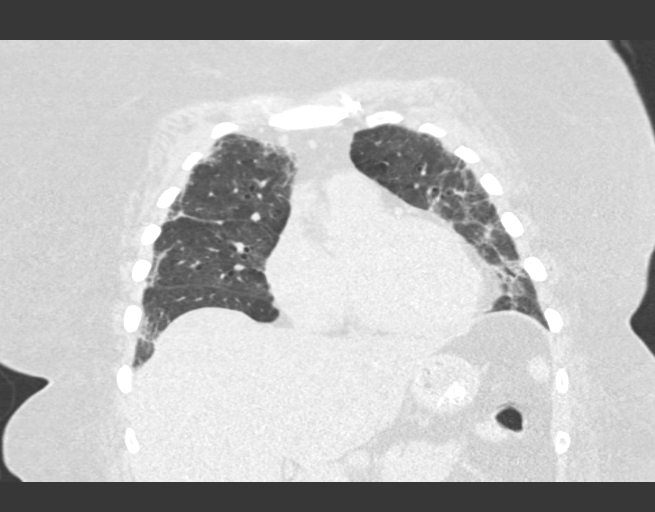
[im 73/122  lung]
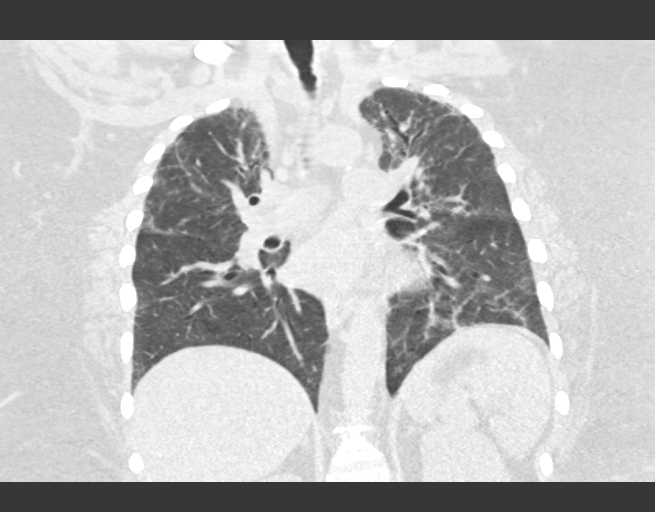

[15 of 36 positions shown; findings below may reference images not displayed]

FINDINGS: Cardiovascular: No significant vascular findings. Normal heart size.
No pericardial effusion.

Mediastinum/Nodes: No enlarged mediastinal or axillary lymph nodes.
Thyroid gland, trachea, and esophagus demonstrate no significant
findings.

Lungs/Pleura: No focal consolidation. Chronic bilateral upper lobe
scarring with mild bronchiectasis. Mild lingular, right middle lobe
and right lower lobe scarring. No pleural effusion or pneumothorax.

Upper Abdomen: No acute upper abdominal abnormality.

Musculoskeletal: No acute osseous abnormality. No aggressive osseous
lesion.
IMPRESSION: 1. No acute cardiopulmonary disease.
2. Mild bilateral chronic interstitial lung disease.

## 2018-10-24 IMAGING — CT CT ANGIO CHEST
2 of 6 series · 18 of 46 positions shown · IV contrast (ISOVUE)
Comparison: Noncontrast CT 7 hours prior.

CLINICAL DATA: PE suspected, intermediate prob, positive D-dimer

EXAM:
CT ANGIOGRAPHY CHEST WITH CONTRAST
TECHNIQUE: Multidetector CT imaging of the chest was performed using the
standard protocol during bolus administration of intravenous
contrast. Multiplanar CT image reconstructions and MIPs were
obtained to evaluate the vascular anatomy.
CONTRAST:  80mL ZGWMU7-IX9 IOPAMIDOL (ZGWMU7-IX9) INJECTION 76%.
Patient pre-medicated prior to the exam due to contrast allergy. No
reactions reported.

[Series 6: thins · axial · 0.67mm/px · z∈[-279,-39]mm · 15 of 264 slices shown]
[im 12/264  lung]
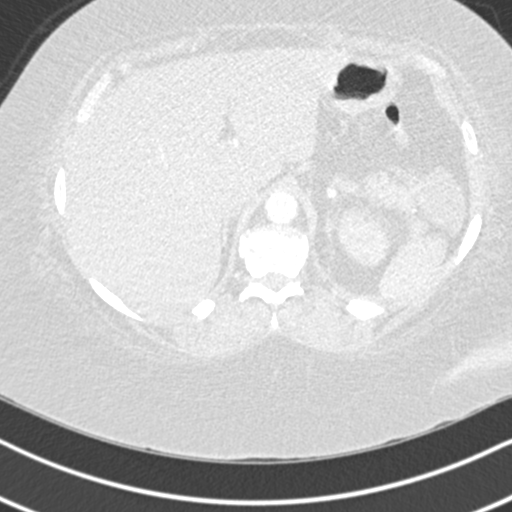
[im 35/264  soft-tissue]
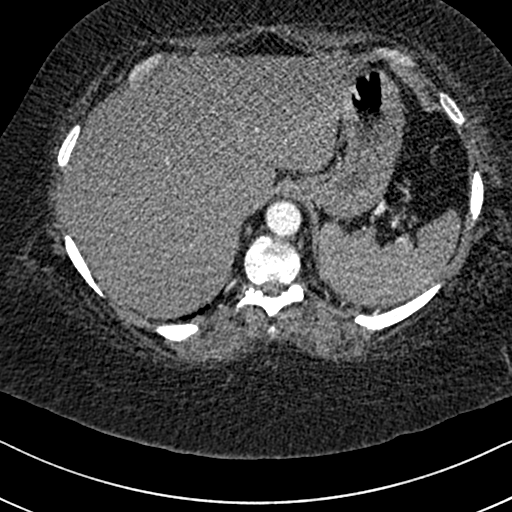
[im 46/264  lung]
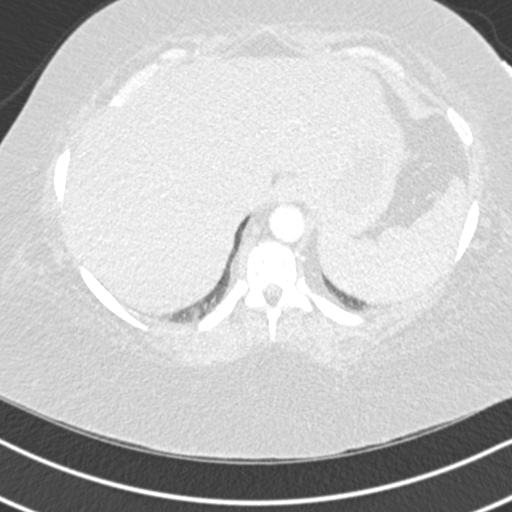
[im 69/264  soft-tissue]
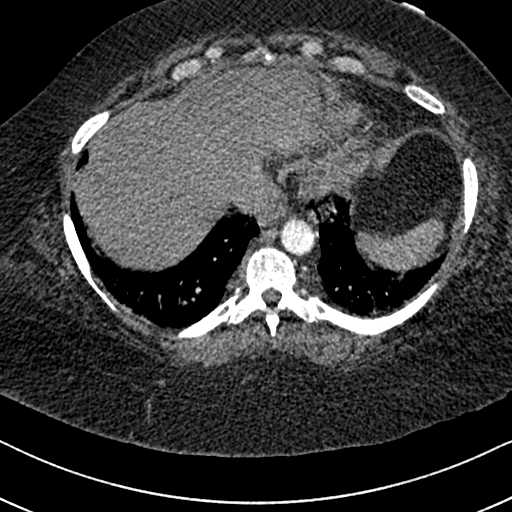
[im 81/264  lung]
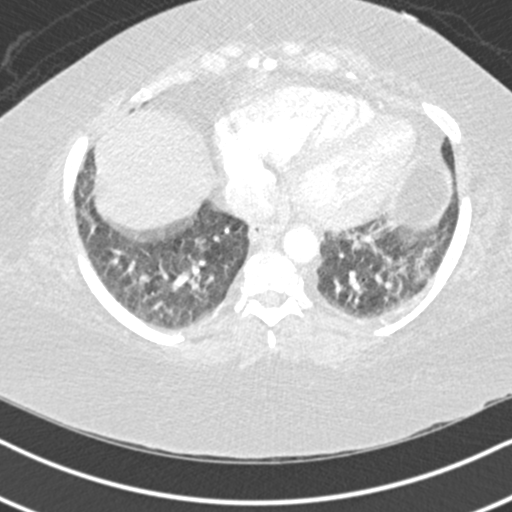
[im 103/264  soft-tissue]
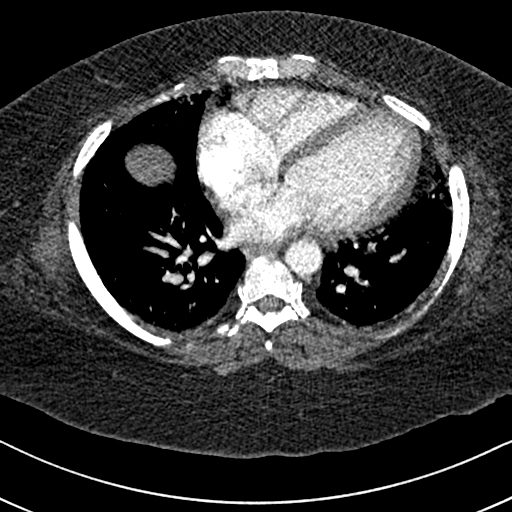
[im 115/264  lung]
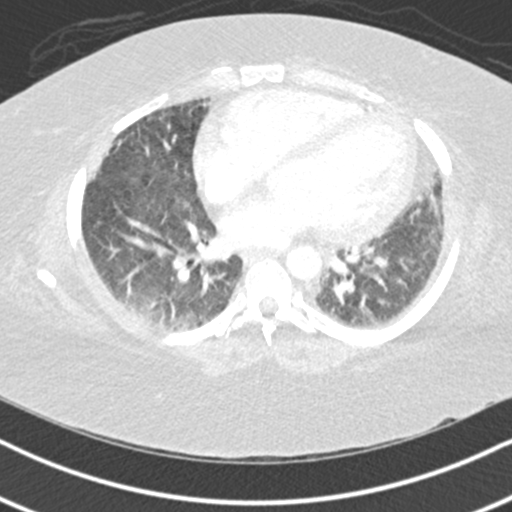
[im 138/264  soft-tissue]
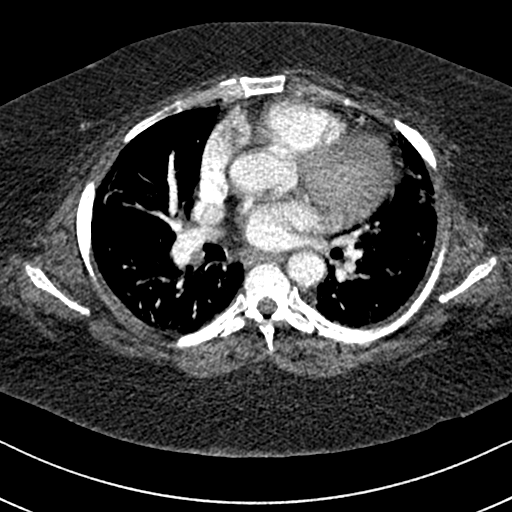
[im 149/264  lung]
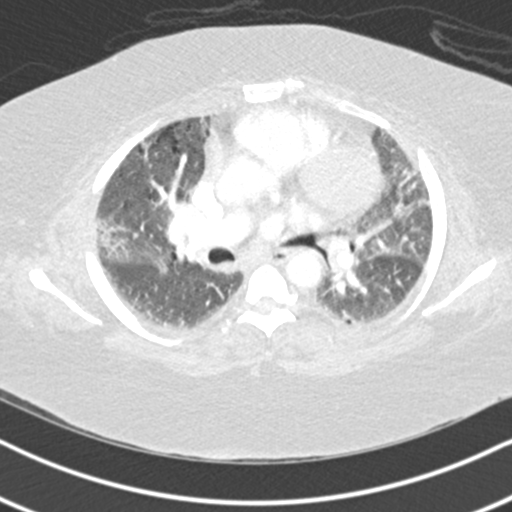
[im 161/264  soft-tissue]
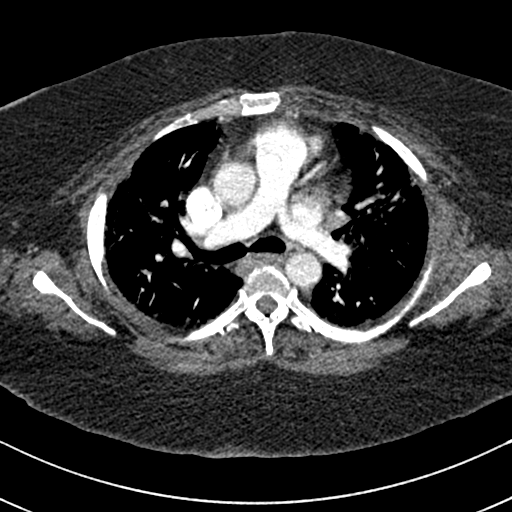
[im 183/264  lung]
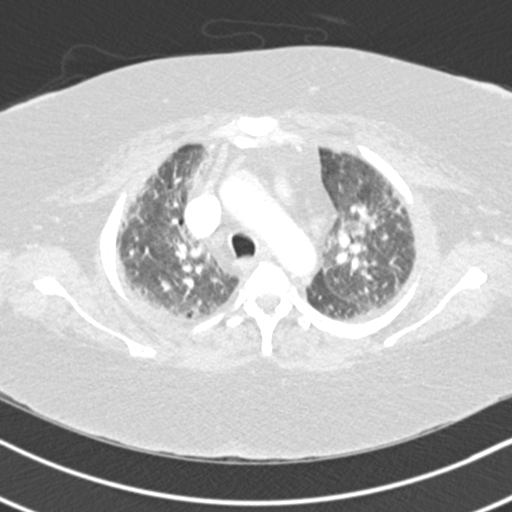
[im 195/264  soft-tissue]
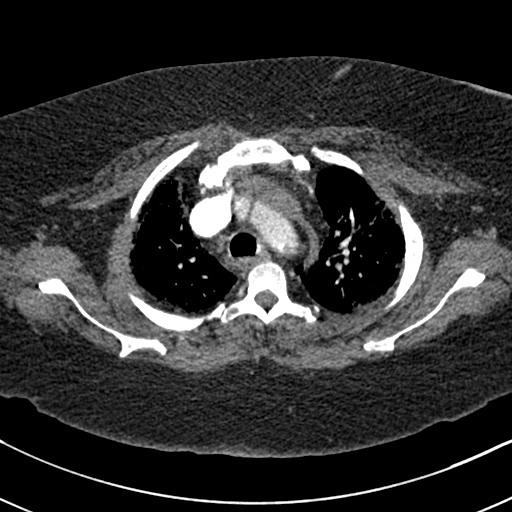
[im 218/264  lung]
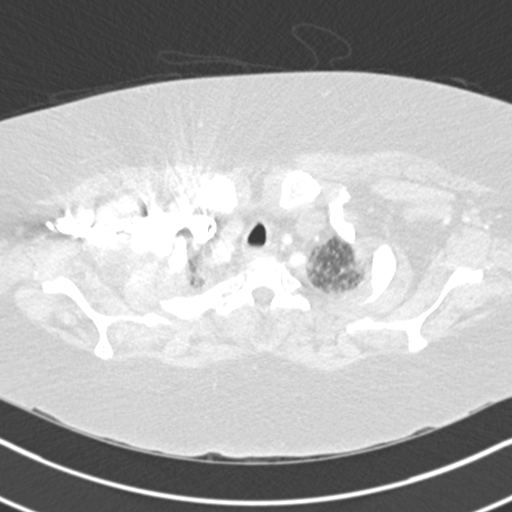
[im 229/264  soft-tissue]
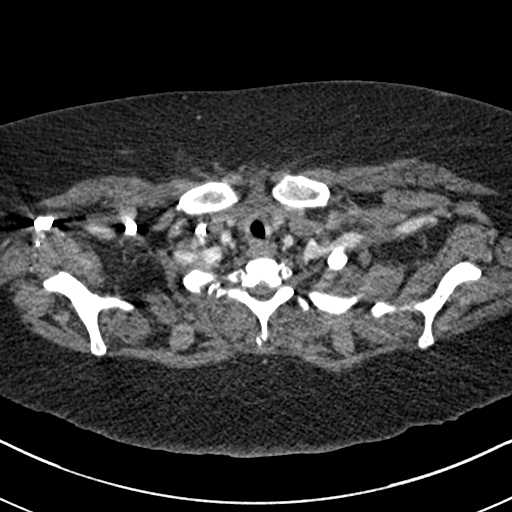
[im 252/264  lung]
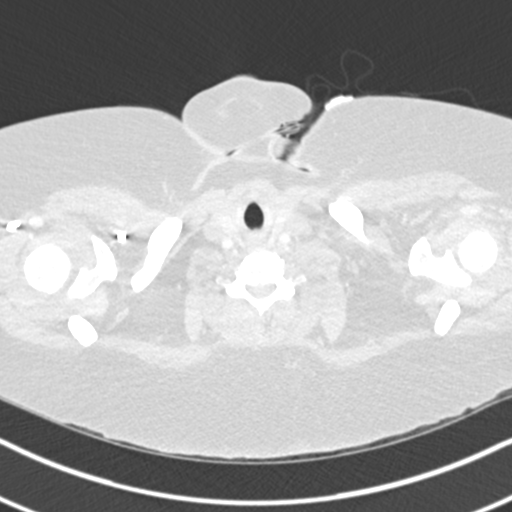

[Series 8: coronal mpr · coronal · 0.55mm/px · 3 of 151 slices shown]
[im 38/151  soft-tissue]
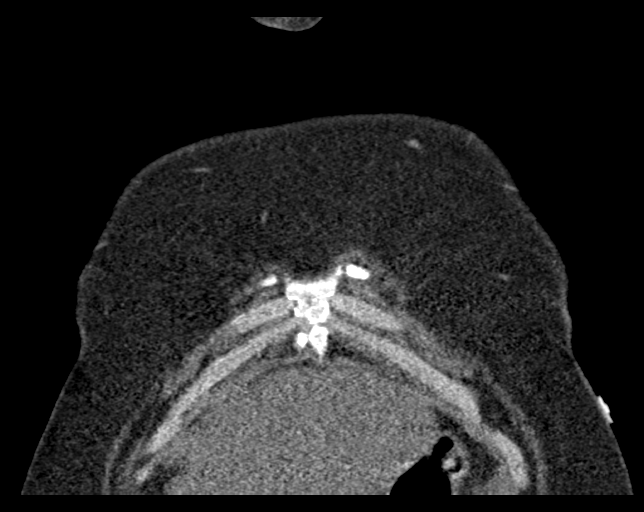
[im 76/151  soft-tissue]
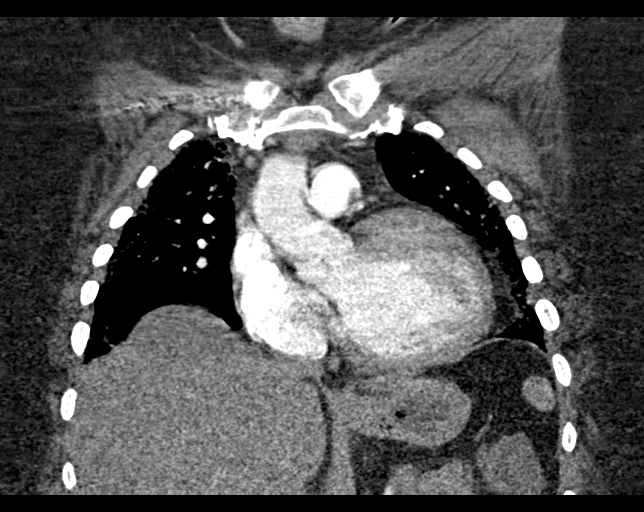
[im 113/151  soft-tissue]
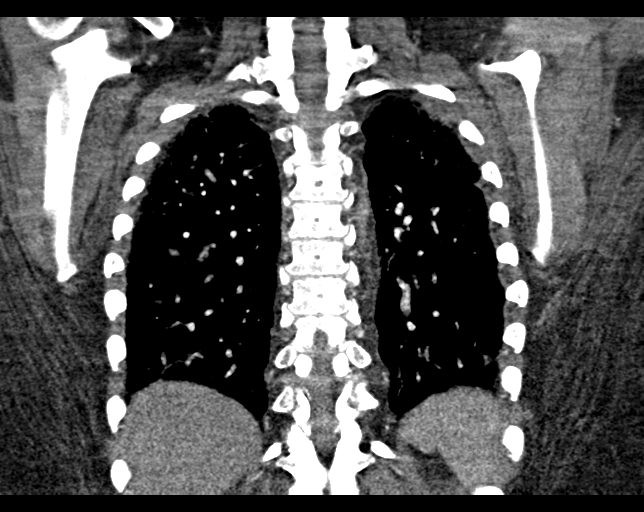

[18 of 46 positions shown; findings below may reference images not displayed]

FINDINGS: Cardiovascular: There are no filling defects within the central
pulmonary arteries to the segmental level to suggest pulmonary
embolus. Subsegmental branches cannot be assessed due to contrast
bolus timing, soft tissue attenuation from habitus, and breathing
motion artifact. Heart size upper normal. Normal caliber thoracic
aorta without dissection. Left vertebral artery arises directly from
the aorta, a normal variant. No pericardial effusion.

Mediastinum/Nodes: No enlarged mediastinal or hilar lymph nodes.
Esophagus is decompressed. Visualized thyroid gland is normal. Lung
parenchyma better assessed on CT earlier this day given degree of
motion on the current exam.

Lungs/Pleura: No confluent consolidation. Subpleural reticulation in
the upper lobes with bronchial thickening and mild upper lobe
bronchiectasis, better assessed on prior exam. A few pulmonary cysts
noted in the lower lobes. Perihilar upper lobe opacities may
represent scarring. No evidence of pulmonary edema. No pleural
effusion. Trachea and mainstem bronchi are grossly patent allowing
for degree motion.

Upper Abdomen: Suspected hepatic steatosis.  No acute findings.

Musculoskeletal: There are no acute or suspicious osseous
abnormalities.

Review of the MIP images confirms the above findings.
IMPRESSION: 1. No pulmonary embolus to the segmental level.
2. Lung parenchymal better assessed on noncontrast CT earlier this
day given degree of motion on the current exam. Findings suspicious
for interstitial lung disease again seen.

## 2018-10-25 ENCOUNTER — Other Ambulatory Visit: Payer: Self-pay | Admitting: Family Medicine

## 2018-10-26 ENCOUNTER — Observation Stay (HOSPITAL_COMMUNITY)
Admission: EM | Admit: 2018-10-26 | Discharge: 2018-10-27 | Disposition: A | Discharge status: Home / Self Care | Payer: Medicare Other | Attending: Internal Medicine | Admitting: Internal Medicine

## 2018-10-26 ENCOUNTER — Emergency Department (HOSPITAL_COMMUNITY): Payer: Medicare Other

## 2018-10-26 ENCOUNTER — Other Ambulatory Visit: Payer: Self-pay

## 2018-10-26 ENCOUNTER — Encounter (HOSPITAL_COMMUNITY): Payer: Self-pay | Admitting: Emergency Medicine

## 2018-10-26 DIAGNOSIS — J4489 Other specified chronic obstructive pulmonary disease: Secondary | ICD-10-CM | POA: Diagnosis present

## 2018-10-26 DIAGNOSIS — F419 Anxiety disorder, unspecified: Secondary | ICD-10-CM | POA: Diagnosis not present

## 2018-10-26 DIAGNOSIS — Z8249 Family history of ischemic heart disease and other diseases of the circulatory system: Secondary | ICD-10-CM | POA: Insufficient documentation

## 2018-10-26 DIAGNOSIS — J441 Chronic obstructive pulmonary disease with (acute) exacerbation: Secondary | ICD-10-CM | POA: Diagnosis not present

## 2018-10-26 DIAGNOSIS — Z79899 Other long term (current) drug therapy: Secondary | ICD-10-CM | POA: Diagnosis not present

## 2018-10-26 DIAGNOSIS — G4700 Insomnia, unspecified: Secondary | ICD-10-CM | POA: Diagnosis not present

## 2018-10-26 DIAGNOSIS — G4733 Obstructive sleep apnea (adult) (pediatric): Secondary | ICD-10-CM | POA: Diagnosis not present

## 2018-10-26 DIAGNOSIS — Z1159 Encounter for screening for other viral diseases: Secondary | ICD-10-CM | POA: Insufficient documentation

## 2018-10-26 DIAGNOSIS — J9601 Acute respiratory failure with hypoxia: Secondary | ICD-10-CM | POA: Insufficient documentation

## 2018-10-26 DIAGNOSIS — I480 Paroxysmal atrial fibrillation: Secondary | ICD-10-CM | POA: Insufficient documentation

## 2018-10-26 DIAGNOSIS — K219 Gastro-esophageal reflux disease without esophagitis: Secondary | ICD-10-CM | POA: Diagnosis not present

## 2018-10-26 DIAGNOSIS — J4551 Severe persistent asthma with (acute) exacerbation: Secondary | ICD-10-CM | POA: Diagnosis present

## 2018-10-26 DIAGNOSIS — D72829 Elevated white blood cell count, unspecified: Secondary | ICD-10-CM | POA: Insufficient documentation

## 2018-10-26 DIAGNOSIS — Z87891 Personal history of nicotine dependence: Secondary | ICD-10-CM | POA: Insufficient documentation

## 2018-10-26 DIAGNOSIS — Z888 Allergy status to other drugs, medicaments and biological substances status: Secondary | ICD-10-CM | POA: Diagnosis not present

## 2018-10-26 DIAGNOSIS — J449 Chronic obstructive pulmonary disease, unspecified: Secondary | ICD-10-CM | POA: Diagnosis present

## 2018-10-26 DIAGNOSIS — Z7901 Long term (current) use of anticoagulants: Secondary | ICD-10-CM | POA: Diagnosis not present

## 2018-10-26 DIAGNOSIS — Z9989 Dependence on other enabling machines and devices: Secondary | ICD-10-CM

## 2018-10-26 DIAGNOSIS — Z6841 Body Mass Index (BMI) 40.0 and over, adult: Secondary | ICD-10-CM | POA: Insufficient documentation

## 2018-10-26 DIAGNOSIS — I10 Essential (primary) hypertension: Secondary | ICD-10-CM | POA: Diagnosis present

## 2018-10-26 DIAGNOSIS — Z7951 Long term (current) use of inhaled steroids: Secondary | ICD-10-CM | POA: Insufficient documentation

## 2018-10-26 DIAGNOSIS — D509 Iron deficiency anemia, unspecified: Secondary | ICD-10-CM | POA: Diagnosis not present

## 2018-10-26 DIAGNOSIS — R0602 Shortness of breath: Secondary | ICD-10-CM | POA: Diagnosis present

## 2018-10-26 DIAGNOSIS — F339 Major depressive disorder, recurrent, unspecified: Secondary | ICD-10-CM | POA: Diagnosis not present

## 2018-10-26 DIAGNOSIS — I5032 Chronic diastolic (congestive) heart failure: Secondary | ICD-10-CM | POA: Insufficient documentation

## 2018-10-26 DIAGNOSIS — I110 Hypertensive heart disease with heart failure: Secondary | ICD-10-CM | POA: Insufficient documentation

## 2018-10-26 DIAGNOSIS — J45901 Unspecified asthma with (acute) exacerbation: Secondary | ICD-10-CM | POA: Diagnosis present

## 2018-10-26 LAB — BASIC METABOLIC PANEL
Anion gap: 9 (ref 5–15)
BUN: 17 mg/dL (ref 6–20)
CO2: 26 mmol/L (ref 22–32)
Calcium: 8.4 mg/dL — ABNORMAL LOW (ref 8.9–10.3)
Chloride: 101 mmol/L (ref 98–111)
Creatinine, Ser: 0.94 mg/dL (ref 0.44–1.00)
GFR calc Af Amer: >60 mL/min (ref >60)
GFR calc non Af Amer: >60 mL/min (ref >60)
Glucose, Bld: 124 mg/dL — ABNORMAL HIGH (ref 70–99)
Potassium: 3.5 mmol/L (ref 3.5–5.1)
Sodium: 136 mmol/L (ref 135–145)

## 2018-10-26 LAB — CBC WITH DIFFERENTIAL/PLATELET
Abs Immature Granulocytes: 0.3 10*3/uL — ABNORMAL HIGH (ref 0.00–0.07)
Basophils Absolute: 0 10*3/uL (ref 0.0–0.1)
Basophils Relative: 0 %
Eosinophils Absolute: 0 10*3/uL (ref 0.0–0.5)
Eosinophils Relative: 0 %
HCT: 37.8 % (ref 36.0–46.0)
Hemoglobin: 11.6 g/dL — ABNORMAL LOW (ref 12.0–15.0)
Lymphocytes Relative: 12 %
Lymphs Abs: 3.5 10*3/uL (ref 0.7–4.0)
MCH: 27.8 pg (ref 26.0–34.0)
MCHC: 30.7 g/dL (ref 30.0–36.0)
MCV: 90.4 fL (ref 80.0–100.0)
Metamyelocytes Relative: 1 %
Monocytes Absolute: 1.2 10*3/uL — ABNORMAL HIGH (ref 0.1–1.0)
Monocytes Relative: 4 %
Neutro Abs: 24.5 10*3/uL — ABNORMAL HIGH (ref 1.7–7.7)
Neutrophils Relative %: 83 %
Platelets: 464 10*3/uL — ABNORMAL HIGH (ref 150–400)
RBC: 4.18 MIL/uL (ref 3.87–5.11)
RDW: 17.1 % — ABNORMAL HIGH (ref 11.5–15.5)
WBC: 29.5 10*3/uL — ABNORMAL HIGH (ref 4.0–10.5)
nRBC: 0 /100 WBC
nRBC: 0.2 % (ref 0.0–0.2)

## 2018-10-26 LAB — RESPIRATORY PANEL BY PCR

## 2018-10-26 LAB — POCT I-STAT 7, (LYTES, BLD GAS, ICA,H+H)
Acid-Base Excess: 3 mmol/L — ABNORMAL HIGH (ref 0.0–2.0)
Bicarbonate: 28.5 mmol/L — ABNORMAL HIGH (ref 20.0–28.0)
Calcium, Ion: 1.18 mmol/L (ref 1.15–1.40)
HCT: 38 % (ref 36.0–46.0)
Hemoglobin: 12.9 g/dL (ref 12.0–15.0)
O2 Saturation: 96 %
Patient temperature: 98.4
Potassium: 3.6 mmol/L (ref 3.5–5.1)
Sodium: 139 mmol/L (ref 135–145)
TCO2: 30 mmol/L (ref 22–32)
pCO2 arterial: 44.2 mmHg (ref 32.0–48.0)
pH, Arterial: 7.417 (ref 7.350–7.450)
pO2, Arterial: 83 mmHg (ref 83.0–108.0)

## 2018-10-26 LAB — BRAIN NATRIURETIC PEPTIDE: B Natriuretic Peptide: 61.9 pg/mL (ref 0.0–100.0)

## 2018-10-26 LAB — HIV ANTIBODY (ROUTINE TESTING W REFLEX): HIV Screen 4th Generation wRfx: NONREACTIVE

## 2018-10-26 LAB — SARS CORONAVIRUS 2 BY RT PCR (HOSPITAL ORDER, PERFORMED IN ~~LOC~~ HOSPITAL LAB): SARS Coronavirus 2: NEGATIVE

## 2018-10-26 LAB — HCG, QUANTITATIVE, PREGNANCY: hCG, Beta Chain, Quant, S: 1 m[IU]/mL (ref <5)

## 2018-10-26 MED ORDER — ALBUTEROL SULFATE (2.5 MG/3ML) 0.083% IN NEBU
2.5000 mg | INHALATION_SOLUTION | RESPIRATORY_TRACT | Status: DC | PRN
  Administered 2018-10-26: 7.5 mg via RESPIRATORY_TRACT
  Administered 2018-10-26: 2.5 mg via RESPIRATORY_TRACT
  Filled 2018-10-26: qty 3

## 2018-10-26 MED ORDER — AZITHROMYCIN 250 MG PO TABS
250.0000 mg | ORAL_TABLET | Freq: Every day | ORAL | Status: DC
  Administered 2018-10-27: 250 mg via ORAL
  Filled 2018-10-26: qty 1

## 2018-10-26 MED ORDER — AEROCHAMBER PLUS FLO-VU LARGE MISC
Status: AC
  Administered 2018-10-26: 1
  Filled 2018-10-26: qty 1

## 2018-10-26 MED ORDER — MONTELUKAST SODIUM 10 MG PO TABS
10.0000 mg | ORAL_TABLET | Freq: Every day | ORAL | Status: DC
  Administered 2018-10-26: 10 mg via ORAL
  Filled 2018-10-26: qty 1

## 2018-10-26 MED ORDER — ALBUTEROL SULFATE (2.5 MG/3ML) 0.083% IN NEBU: 2.5000 mg | INHALATION_SOLUTION | RESPIRATORY_TRACT | Status: DC

## 2018-10-26 MED ORDER — ALBUTEROL (5 MG/ML) CONTINUOUS INHALATION SOLN
10.0000 mg/h | INHALATION_SOLUTION | RESPIRATORY_TRACT | Status: DC
  Administered 2018-10-26: 10 mg/h via RESPIRATORY_TRACT
  Filled 2018-10-26: qty 20

## 2018-10-26 MED ORDER — METOPROLOL TARTRATE 50 MG PO TABS
50.0000 mg | ORAL_TABLET | Freq: Two times a day (BID) | ORAL | Status: DC
  Administered 2018-10-26: 50 mg via ORAL
  Filled 2018-10-26: qty 1

## 2018-10-26 MED ORDER — PANTOPRAZOLE SODIUM 40 MG PO TBEC
40.0000 mg | DELAYED_RELEASE_TABLET | Freq: Every day | ORAL | Status: DC
  Administered 2018-10-26 – 2018-10-27 (×2): 40 mg via ORAL
  Filled 2018-10-26 (×2): qty 1

## 2018-10-26 MED ORDER — METHYLPREDNISOLONE SODIUM SUCC 125 MG IJ SOLR
60.0000 mg | Freq: Three times a day (TID) | INTRAMUSCULAR | Status: DC
  Administered 2018-10-26: 60 mg via INTRAVENOUS

## 2018-10-26 MED ORDER — AZITHROMYCIN 500 MG PO TABS
500.0000 mg | ORAL_TABLET | Freq: Every day | ORAL | Status: AC
  Administered 2018-10-26: 500 mg via ORAL
  Filled 2018-10-26: qty 1

## 2018-10-26 MED ORDER — ACETAMINOPHEN 325 MG PO TABS
650.0000 mg | ORAL_TABLET | Freq: Four times a day (QID) | ORAL | Status: DC | PRN
  Administered 2018-10-26 – 2018-10-27 (×3): 650 mg via ORAL
  Filled 2018-10-26 (×3): qty 2

## 2018-10-26 MED ORDER — APIXABAN 5 MG PO TABS
5.0000 mg | ORAL_TABLET | Freq: Two times a day (BID) | ORAL | Status: DC
  Administered 2018-10-26 – 2018-10-27 (×3): 5 mg via ORAL
  Filled 2018-10-26 (×3): qty 1

## 2018-10-26 MED ORDER — IPRATROPIUM BROMIDE HFA 17 MCG/ACT IN AERS
4.0000 | INHALATION_SPRAY | Freq: Once | RESPIRATORY_TRACT | Status: AC
  Administered 2018-10-26: 4 via RESPIRATORY_TRACT
  Filled 2018-10-26: qty 12.9

## 2018-10-26 MED ORDER — ALBUTEROL SULFATE (2.5 MG/3ML) 0.083% IN NEBU
2.5000 mg | INHALATION_SOLUTION | RESPIRATORY_TRACT | Status: DC | PRN
  Administered 2018-10-26 – 2018-10-27 (×3): 2.5 mg via RESPIRATORY_TRACT
  Filled 2018-10-26 (×3): qty 3

## 2018-10-26 MED ORDER — FUROSEMIDE 40 MG PO TABS
40.0000 mg | ORAL_TABLET | Freq: Every day | ORAL | Status: DC
  Administered 2018-10-26 – 2018-10-27 (×2): 40 mg via ORAL
  Filled 2018-10-26 (×2): qty 1

## 2018-10-26 MED ORDER — IPRATROPIUM-ALBUTEROL 0.5-2.5 (3) MG/3ML IN SOLN
3.0000 mL | Freq: Four times a day (QID) | RESPIRATORY_TRACT | Status: DC
  Administered 2018-10-26 – 2018-10-27 (×5): 3 mL via RESPIRATORY_TRACT
  Filled 2018-10-26 (×5): qty 3

## 2018-10-26 MED ORDER — ZOLPIDEM TARTRATE 5 MG PO TABS
5.0000 mg | ORAL_TABLET | Freq: Every evening | ORAL | Status: DC | PRN
  Administered 2018-10-26: 5 mg via ORAL
  Filled 2018-10-26: qty 1

## 2018-10-26 MED ORDER — ALBUTEROL SULFATE HFA 108 (90 BASE) MCG/ACT IN AERS
INHALATION_SPRAY | RESPIRATORY_TRACT | Status: AC
  Administered 2018-10-26: 8
  Filled 2018-10-26: qty 6.7

## 2018-10-26 MED ORDER — MAGNESIUM SULFATE 2 GM/50ML IV SOLN
2.0000 g | Freq: Once | INTRAVENOUS | Status: AC
  Administered 2018-10-26: 2 g via INTRAVENOUS
  Filled 2018-10-26: qty 50

## 2018-10-26 MED ORDER — ONDANSETRON HCL 4 MG/2ML IJ SOLN: 4.0000 mg | Freq: Three times a day (TID) | INTRAMUSCULAR | Status: DC | PRN

## 2018-10-26 MED ORDER — HYDRALAZINE HCL 10 MG PO TABS
10.0000 mg | ORAL_TABLET | Freq: Three times a day (TID) | ORAL | Status: DC
  Administered 2018-10-26 – 2018-10-27 (×5): 10 mg via ORAL
  Filled 2018-10-26 (×5): qty 1

## 2018-10-26 MED ORDER — MOMETASONE FURO-FORMOTEROL FUM 200-5 MCG/ACT IN AERO
2.0000 | INHALATION_SPRAY | Freq: Two times a day (BID) | RESPIRATORY_TRACT | Status: DC
  Administered 2018-10-27: 2 via RESPIRATORY_TRACT
  Filled 2018-10-26 (×2): qty 8.8

## 2018-10-26 MED ORDER — DULOXETINE HCL 60 MG PO CPEP
60.0000 mg | ORAL_CAPSULE | Freq: Every day | ORAL | Status: DC
  Administered 2018-10-26 – 2018-10-27 (×2): 60 mg via ORAL
  Filled 2018-10-26 (×2): qty 1

## 2018-10-26 MED ORDER — DM-GUAIFENESIN ER 30-600 MG PO TB12: 1.0000 | ORAL_TABLET | Freq: Two times a day (BID) | ORAL | Status: DC | PRN

## 2018-10-26 MED ORDER — HYDRALAZINE HCL 20 MG/ML IJ SOLN: 5.0000 mg | INTRAMUSCULAR | Status: DC | PRN

## 2018-10-26 MED ORDER — ALBUTEROL (5 MG/ML) CONTINUOUS INHALATION SOLN
2.5000 mg/h | INHALATION_SOLUTION | RESPIRATORY_TRACT | Status: DC
  Filled 2018-10-26: qty 20

## 2018-10-26 MED ORDER — METOPROLOL TARTRATE 50 MG PO TABS
50.0000 mg | ORAL_TABLET | Freq: Two times a day (BID) | ORAL | Status: DC
  Administered 2018-10-26 – 2018-10-27 (×3): 50 mg via ORAL
  Filled 2018-10-26 (×3): qty 1

## 2018-10-26 MED ORDER — METHYLPREDNISOLONE SODIUM SUCC 125 MG IJ SOLR
80.0000 mg | Freq: Three times a day (TID) | INTRAMUSCULAR | Status: DC
  Administered 2018-10-26 – 2018-10-27 (×4): 80 mg via INTRAVENOUS
  Filled 2018-10-26 (×4): qty 2

## 2018-10-26 MED ORDER — IPRATROPIUM-ALBUTEROL 0.5-2.5 (3) MG/3ML IN SOLN
3.0000 mL | RESPIRATORY_TRACT | Status: DC
  Administered 2018-10-26: 3 mL via RESPIRATORY_TRACT
  Filled 2018-10-26: qty 3

## 2018-10-26 NOTE — Significant Event (Addendum)
Rapid Response Event Note  Overview:Called respiratory with concern for SOB/wheezing s/p 8 puffs albuterol, 4 puffs atrovent in ED, 1 duoneb, and 7.5mg  albuterol.  Time Called: 0519 Arrival Time: 0525 Event Type: Respiratory  Initial Focused Assessment: Pt laying in bed in respiratory distress +WOB, +accessory muscle use, + nasal flaring. Audible wheezing heard from doorway. Pt unable to speak in complete sentences. T-98.2, HR-95, BP-185/108, RR-26, SpO2-99%. Pt was ordered a continuous neb in ED @ 0330 but unable to administer d/t covid swab pending.  Interventions: Bipap/cont neb Plan of Care (if not transferred): Transfer to 6E Event Summary: Name of Physician Notified: Dr. Donna Bernard at 0600    at    Outcome: Transferred (Comment)(6E)     Linna Darner, Carren Rang

## 2018-10-26 NOTE — Progress Notes (Signed)
Pt states she has been unable to tolerate BIPAP.  Does not want to wear tonight.  RT will continue to monitor.

## 2018-10-26 NOTE — Progress Notes (Signed)
Patient admitted after midnight. 48 yo hx asthma not on home oxygen, copd, htn, dCHF, osa, ida, morbid obesity, afib admitted with acute respiratory failure with hypoxia due to copd/asthma exacerbation. Of note, patient intubated last year for same. Initially required bipap but could not tolerate. Oxygen saturation level 88% on room air with rate 36.   Receiving scheduled nebs, solu-medrol, and flutter valve.   PE Gen: obese moderate increased work of breathing with conversation. No acute distress CV: rrr no mgr trace LE edema Resp: moderate increased work of breathing with conversation. Unable to complete sentence. Some use of abdominal accessory muscles. BS quite distant with some air movement. Scattered rhonchi and wheezing.  A/P  Schedule nebs Flutter valve Solumedrol Supplemental oxygen bipap as indicated Monitor closely   Santiago Glad black, np

## 2018-10-26 NOTE — Discharge Instructions (Signed)

## 2018-10-26 NOTE — Progress Notes (Signed)
Report called and given to RN on [address] Neta Mends 6:59 AM 10-26-2018

## 2018-10-26 NOTE — Progress Notes (Signed)
Attempted to place BIPAP mask on patient but she says she cannot tolerate the bipap and will not wear. Explained to patient the importance of wearing it but she still does not want to wear the mask.

## 2018-10-26 NOTE — ED Provider Notes (Signed)
[facility] Provider Note  CSN: [id number] Arrival date & time: 10/26/18 0040  Chief Complaint(s) Shortness of Breath  HPI April [name] is a 48 y.o. female extended past medical history listed below including asthma, COPD, OSA, diastolic heart failure on Lasix who presents to the emergency department with gradually worsening shortness of breath of the past day similar to prior asthma exacerbations.  Patient is tried using her home inhalers without relief.  She was brought in by EMS who gave the patient a breathing treatment, 2 doses of epinephrine and Solu-Medrol.  Following this she did improve.  Endorsing mild cough.  No fevers.  No known exposures to COVID positive persons.  No chest pain.  No edema.  Denies any other physical complaints.  HPI  Past Medical History Past Medical History:  Diagnosis Date   Acanthosis nigricans    Anxiety    Arthritis    "knees" (04/28/2017)   Asthma    Followed by Dr. Annamaria Boots (pulmonology); receives every other week omalizumab injections; has frequent exacerbations   Chronic diastolic CHF (congestive heart failure) ([place]) 01/17/2017   COPD (chronic obstructive pulmonary disease) ([place])    PFTs in 2002, FEV1/FVC 65, no post bronchodilater test done   Depression    GERD (gastroesophageal reflux disease)    Headache(784.0)    "q couple days" (16/10/3708)   Helicobacter pylori (H. pylori) infection    Hypertension, essential    Insomnia    Menorrhagia    Morbid obesity ([place])    OSA on CPAP    Sleep study 2008 - mild OSA, not enough events to titrate CPAP; wears CPAP now/pt on 04/28/2017   Pneumonia X 1   Seasonal allergies    Shortness of breath    Tobacco user    Patient Active Problem List   Diagnosis Date Noted   Acute respiratory distress 08/03/2018   Acute exacerbation of chronic obstructive pulmonary disease (COPD) ([place]) 08/03/2018   Vitamin D deficiency 05/26/2018   Acute  asthma exacerbation 05/16/2018   Urinary incontinence 11/17/2017   Nausea with vomiting 11/06/2017   Hypomagnesemia 10/27/2017   Dysphagia 10/27/2017   Physical deconditioning 10/25/2017   Acute maxillary sinusitis 10/24/2017   Emesis, persistent 10/08/2017   Volume depletion 10/08/2017   Pleuritic chest pain 10/08/2017   SOB (shortness of breath) 10/08/2017   Debility 09/22/2017   HCAP (healthcare-associated pneumonia) 08/06/2017   Chronic atrial fibrillation 07/29/2017   AF (paroxysmal atrial fibrillation) ([place])    COPD with acute exacerbation ([place]) 02/10/2017   Dyspnea 02/04/2017   Chronic diastolic CHF (congestive heart failure) ([place]) 01/17/2017   Asthma exacerbation 62/69/4854   Metabolic syndrome 62/70/3500   Moderate persistent asthma with exacerbation 10/19/2016   Normocytic anemia 10/02/2016   Elevated hemoglobin A1c 05/05/2016   GERD (gastroesophageal reflux disease) 08/30/2015   Anxiety and depression 08/30/2015   Generalized anxiety disorder 08/30/2015   Seasonal allergic rhinitis 08/29/2013   Leukocytosis 10/21/2012   Tobacco abuse in remission 10/07/2012   COPD mixed type ([place]) 05/07/2012   Knee pain, bilateral 04/25/2011   Primary insomnia 03/14/2011   OSA on CPAP 12/19/2010   Obstructive sleep apnea 12/19/2010   Hypokalemia 08/13/2010   Cervical back pain with evidence of disc disease 04/08/2008   Essential hypertension 07/31/2006   Morbid obesity with body mass index of 50.0-59.9 in adult [facility]) 06/17/2006   Major depressive disorder, recurrent episode ([place]) 04/10/2006   Home Medication(s) Prior to Admission medications   Medication Sig Start Date  End Date Taking? Authorizing Provider  albuterol (PROVENTIL HFA;VENTOLIN HFA) 108 (90 Base) MCG/ACT inhaler Inhale 2 puffs into the lungs every 4 (four) hours as needed for wheezing or shortness of breath. 08/12/18  Yes [name], MD  albuterol (PROVENTIL) (2.5 MG/3ML)  0.083% nebulizer solution Take 3 mLs (2.5 mg total) by nebulization every 6 (six) hours as needed for wheezing or shortness of breath. 08/12/18  Yes Newlin, Charlane Ferretti, MD  ipratropium (ATROVENT) 0.02 % nebulizer solution Take 2.5 mLs (0.5 mg total) by nebulization 4 (four) times daily. 10/05/18  Yes Pfeiffer, Jeannie Done, MD  ipratropium-albuterol (DUONEB) 0.5-2.5 (3) MG/3ML SOLN Inhale 3 ml two times daily and every 2 hours as needed for shortness of breath or wheezing Inhale 3 ml two times daily and every 2 hours as needed for shortness of breath or wheezing Patient taking differently: Inhale 3 mLs into the lungs every 2 (two) hours as needed (wheezing/shortness of breath).  05/13/18  Yes Lorin Glass, PA-C  apixaban (ELIQUIS) 5 MG TABS tablet Take 1 tablet (5 mg total) by mouth 2 (two) times daily. Patient not taking: Reported on 10/26/2018 08/12/18   [name], MD  DULoxetine (CYMBALTA) 60 MG capsule Take 1 capsule (60 mg total) by mouth daily. Patient not taking: Reported on 10/26/2018 08/12/18   [name], MD  ergocalciferol (DRISDOL) 1.25 MG (50000 UT) capsule Take 1 capsule (50,000 Units total) by mouth once a week. Patient not taking: Reported on 10/26/2018 05/26/18   [name], MD  Ferrous Sulfate (IRON) 325 (65 Fe) MG TABS Take 1 tablet (325 mg total) by mouth every morning. Patient not taking: Reported on 10/26/2018 01/26/18   Argentina Donovan, PA-C  fluticasone [facility]) 50 MCG/ACT nasal spray Place 1 spray into both nostrils daily. Patient not taking: Reported on 10/26/2018 08/12/18   [name], MD  furosemide (LASIX) 40 MG tablet Take 1 tablet (40 mg total) by mouth daily. Patient not taking: Reported on 10/26/2018 09/10/18   Ladell Pier, MD  gabapentin (NEURONTIN) 300 MG capsule Take 2 capsules (600 mg total) by mouth 2 (two) times daily. Patient not taking: Reported on 10/26/2018 08/12/18   [name], MD  guaiFENesin (MUCINEX) 600 MG 12 hr tablet Take 2 tablets  (1,200 mg total) by mouth 2 (two) times daily. Patient not taking: Reported on 10/26/2018 05/20/18   Cristal Ford, DO  hydrALAZINE (APRESOLINE) 10 MG tablet Take 1 tablet (10 mg total) by mouth every 8 (eight) hours for 30 days. Patient not taking: Reported on 10/26/2018 08/04/18 10/26/27  [name], NP  metoprolol tartrate (LOPRESSOR) 50 MG tablet Take 1 tablet (50 mg total) by mouth 2 (two) times daily. Patient not taking: Reported on 10/26/2018 08/12/18   [name], MD  mometasone-formoterol (DULERA) 200-5 MCG/ACT AERO Inhale 2 puffs into the lungs 2 (two) times daily. Patient not taking: Reported on 10/26/2018 07/16/18   [name], MD  montelukast (SINGULAIR) 10 MG tablet Take 1 tablet (10 mg total) by mouth at bedtime. Patient not taking: Reported on 10/26/2018 08/12/18   [name], MD  nicotine (NICODERM CQ - DOSED IN MG/24 HOURS) 21 mg/24hr patch Place 1 patch (21 mg total) onto the skin daily. Patient not taking: Reported on 10/26/2018 05/21/18   Cristal Ford, DO  omeprazole (PRILOSEC) 20 MG capsule Take 1 capsule (20 mg total) by mouth daily. Patient not taking: Reported on 10/26/2018 08/12/18   [name], MD  potassium chloride SA (K-DUR,KLOR-CON) 20 MEQ tablet Take 1 tablet (20  mEq total) by mouth daily. Patient not taking: Reported on 10/26/2018 08/04/18   [name], NP  predniSONE (DELTASONE) 10 MG tablet TAKE 2 TABLETS BY MOUTH DAILY Patient not taking: Reported on 10/26/2018 10/19/18   Deneise Lever, MD  zolpidem (AMBIEN) 5 MG tablet TAKE 1 TABLET(5 MG) BY MOUTH AT BEDTIME Patient not taking: No sig reported 07/16/18   [name], MD                                                                                                                                    Past Surgical History Past Surgical History:  Procedure Laterality Date   CARDIOVERSION N/A 05/30/2017   Procedure: CARDIOVERSION;  Surgeon: Sanda Klein, MD;  Location: [facility] ENDOSCOPY;   Service: Cardiovascular;  Laterality: N/A;   REDUCTION MAMMAPLASTY Bilateral 09/2011   TUBAL LIGATION  1996   bilateral   Family History Family History  Problem Relation Age of Onset   Hypertension Mother    Asthma Daughter    Cancer Paternal Aunt    Asthma Maternal Grandmother     Social History Social History   Tobacco Use   Smoking status: Former Smoker    Packs/day: 0.50    Years: 26.00    Pack years: 13.00    Types: Cigarettes    Quit date: 09/12/2014    Years since quitting: 4.1   Smokeless tobacco: Never Used  Substance Use Topics   Alcohol use: No   Drug use: No   Allergies Contrast media [iodinated diagnostic agents]  Review of Systems Review of Systems All other systems are reviewed and are negative for acute change except as noted in the HPI  Physical Exam Vital Signs  I have reviewed the triage vital signs BP 97/73    Pulse 97    Temp 98.4 F (36.9 C) (Oral)    Resp (!) 24    Ht 5\' 6"  (1.676 m)    Wt (!) 139.9 kg    SpO2 99%    BMI 49.78 kg/m   Physical Exam Vitals signs reviewed.  Constitutional:      General: She is in acute distress.     Appearance: She is well-developed. She is obese. She is not diaphoretic.  HENT:     Head: Normocephalic and atraumatic.     Right Ear: External ear normal.     Left Ear: External ear normal.     Nose: Nose normal.  Eyes:     General: No scleral icterus.    Conjunctiva/sclera: Conjunctivae normal.  Neck:     Musculoskeletal: Normal range of motion.     Trachea: Phonation normal.  Cardiovascular:     Rate and Rhythm: Normal rate and regular rhythm.     Comments: No peripheral edema Pulmonary:     Effort: Tachypnea and respiratory distress present.     Breath sounds: No stridor. Examination of the right-upper field reveals wheezing.  Examination of the left-upper field reveals wheezing. Examination of the left-middle field reveals wheezing. Examination of the right-lower field reveals wheezing.  Examination of the left-lower field reveals wheezing. Wheezing present.  Abdominal:     General: There is no distension.  Musculoskeletal: Normal range of motion.  Neurological:     Mental Status: She is alert and oriented to person, place, and time.  Psychiatric:        Behavior: Behavior normal.     ED Results and Treatments Labs (all labs ordered are listed, but only abnormal results are displayed) Labs Reviewed  CBC WITH DIFFERENTIAL/PLATELET - Abnormal; Notable for the following components:      Result Value   WBC 29.5 (*)    Hemoglobin 11.6 (*)    RDW 17.1 (*)    Platelets 464 (*)    Neutro Abs 24.5 (*)    Monocytes Absolute 1.2 (*)    Abs Immature Granulocytes 0.30 (*)    All other components within normal limits  BASIC METABOLIC PANEL - Abnormal; Notable for the following components:   Glucose, Bld 124 (*)    Calcium 8.4 (*)    All other components within normal limits  POCT I-STAT 7, (LYTES, BLD GAS, ICA,H+H) - Abnormal; Notable for the following components:   Bicarbonate 28.5 (*)    Acid-Base Excess 3.0 (*)    All other components within normal limits  SARS CORONAVIRUS 2 (HOSPITAL ORDER, [facility])  BRAIN NATRIURETIC PEPTIDE  BLOOD GAS, ARTERIAL                                                                                                                         EKG  EKG Interpretation  Date/Time:  Monday October 26 2018 00:42:56 EDT Ventricular Rate:  101 PR Interval:    QRS Duration: 87 QT Interval:  367 QTC Calculation: 476 R Axis:   -30 Text Interpretation:  Sinus tachycardia Right atrial enlargement Probable left ventricular hypertrophy Anterior Q waves, possibly due to LVH No acute changes Confirmed by Addison Lank [phone]) on 10/26/2018 1:00:33 AM      Radiology Dg Chest Port 1 View  Result Date: 10/26/2018 CLINICAL DATA:  Shortness of breath EXAM: PORTABLE CHEST 1 VIEW COMPARISON:  10/05/2018 FINDINGS: Heart and  mediastinal contours are within normal limits. No focal opacities or effusions. No acute bony abnormality. IMPRESSION: No active disease. Electronically Signed   By: Rolm Baptise M.D.   On: 10/26/2018 01:08    Pertinent labs & imaging results that were available during my care of the patient were reviewed by me and considered in my medical decision making (see chart for details).  Medications Ordered in ED Medications  albuterol (PROVENTIL,VENTOLIN) solution continuous neb (has no administration in time range)  albuterol (VENTOLIN HFA) 108 (90 Base) MCG/ACT inhaler (8 puffs  Given 10/26/18 0049)  AeroChamber Plus Flo-Vu Large MISC (1 each  Given 10/26/18 0049)  ipratropium (ATROVENT HFA) inhaler 4 puff (4 puffs  Inhalation Given 10/26/18 0105)  magnesium sulfate IVPB 2 g 50 mL (0 g Intravenous Stopped 10/26/18 0218)                                                                                                                                    Procedures .Critical Care Performed by: Fatima Blank, MD Authorized by: Fatima Blank, MD     CRITICAL CARE Performed by: Grayce Sessions Avishai Reihl Total critical care time: 45 minutes Critical care time was exclusive of separately billable procedures and treating other patients. Critical care was necessary to treat or prevent imminent or life-threatening deterioration. Critical care was time spent personally by me on the following activities: development of treatment plan with patient and/or surrogate as well as nursing, discussions with consultants, evaluation of patient's response to treatment, examination of patient, obtaining history from patient or surrogate, ordering and performing treatments and interventions, ordering and review of laboratory studies, ordering and review of radiographic studies, pulse oximetry and re-evaluation of patient's condition.  (including critical care time)  Medical Decision Making / ED Course I have  reviewed the nursing notes for this encounter and the patient's prior records (if available in EHR or on provided paperwork).  Respiratory distress low suspicion for asthma/COPD exacerbation.  No evidence of volume overload on exam.  Patient already received Solu-Medrol.  Will provide additional breathing treatments, magnesium.  Will obtain screening labs and chest x-ray.  COVID testing will be obtained as well.  COVID negative.  Patient breathing improved following the initial breathing treatment.  Still wheezy on reexamination and given additional 4 puffs of albuterol.  Chest x-ray without evidence of pneumonia, pulmonary edema, pneumothorax.  Labs notable for significant leukocytosis which is likely demargination, from epi and steroids.  ABG and rest the labs reassuring.  Patient still with wheezing and and tachypnea, requesting continuous neb.  Ordered.  Will admit to medicine for continued management.     Soleia HAMDI VARI was evaluated in Emergency Department on 10/26/2018 for the symptoms described in the history of present illness. She was evaluated in the context of the global COVID-19 pandemic, which necessitated consideration that the patient might be at risk for infection with the SARS-CoV-2 virus that causes COVID-19. Institutional protocols and algorithms that pertain to the evaluation of patients at risk for COVID-19 are in a state of rapid change based on information released by regulatory bodies including the CDC and federal and state organizations. These policies and algorithms were followed during the patient's care in the ED.  Final Clinical Impression(s) / ED Diagnoses Final diagnoses:  SOB (shortness of breath)  Severe persistent asthma with exacerbation      This chart was dictated using voice recognition software.  Despite best efforts to proofread,  errors can occur which can change the documentation meaning.   Fatima Blank, MD 10/26/18 [phone]

## 2018-10-26 NOTE — H&P (Signed)
History and Physical    April [name] April [name] DOB: 23-Aug-1970 DOA: 10/26/2018  Referring MD/NP/PA:   PCP: April Rakes, MD   Patient coming from:  The patient is coming from home.  At baseline, pt is independent for most of ADL.        Chief Complaint: Shortness of breath  HPI: April [name] is a 48 y.o. female with medical history significant of COPD, asthma, former smoker, hypertension, GERD, dCHF, OSA on CPAP, iron deficiency anemia, morbid obesity, atrial fibrillation on Eliquis (not compliant), who presents with shortness of breath.  Patient states that she started having worsening shortness of breath since yesterday, which has been progressively worsening.  She has dry cough and wheezing.  She has subjective fever, no chills.  Her temperature is 98.4 in ED.  Denies any chest pain.  No nausea vomiting, diarrhea, abdominal pain, symptoms of UTI or unilateral weakness.  No sick contact.  Patient states that she took 50 mg of prednisone yesterday without significant help.  ED Course: pt was found to have WBC 29.5, BNP 61.9, negative COVID-19 test, electrolytes and renal function okay, temperature normal, blood pressure 97/73 initially, but the repeated blood pressure 186/108, chest x-ray negative.  Patient is placed on telemetry bed for observation.  Review of Systems:   General: Has subjective fevers, no chills, no body weight gain, has fatigue HEENT: no blurry vision, hearing changes or sore throat Respiratory: Has dyspnea, coughing, wheezing CV: no chest pain, no palpitations GI: no nausea, vomiting, abdominal pain, diarrhea, constipation GU: no dysuria, burning on urination, increased urinary frequency, hematuria  Ext: no leg edema Neuro: no unilateral weakness, numbness, or tingling, no vision change or hearing loss Skin: no rash, no skin tear. MSK: No muscle spasm, no deformity, no limitation of range of movement in spin Heme: No easy bruising.  Travel history: No recent  long distant travel.  Allergy:  Allergies  Allergen Reactions  . Contrast Media [Iodinated Diagnostic Agents] Itching    Ct contrast    Past Medical History:  Diagnosis Date  . Acanthosis nigricans   . Anxiety   . Arthritis    "knees" (04/28/2017)  . Asthma    Followed by Dr. Annamaria Boots (pulmonology); receives every other week omalizumab injections; has frequent exacerbations  . Chronic diastolic CHF (congestive heart failure) ([place]) 01/17/2017  . COPD (chronic obstructive pulmonary disease) ([place])    PFTs in 2002, FEV1/FVC 65, no post bronchodilater test done  . Depression   . GERD (gastroesophageal reflux disease)   . Headache(784.0)    "q couple days" (04/28/2017)  . Helicobacter pylori (H. pylori) infection   . Hypertension, essential   . Insomnia   . Menorrhagia   . Morbid obesity ([place])   . OSA on CPAP    Sleep study 2008 - mild OSA, not enough events to titrate CPAP; wears CPAP now/pt on 04/28/2017  . Pneumonia X 1  . Seasonal allergies   . Shortness of breath   . Tobacco user     Past Surgical History:  Procedure Laterality Date  . CARDIOVERSION N/A 05/30/2017   Procedure: CARDIOVERSION;  Surgeon: Sanda Klein, MD;  Location: [facility];  Service: Cardiovascular;  Laterality: N/A;  . REDUCTION MAMMAPLASTY Bilateral 09/2011  . TUBAL LIGATION  1996   bilateral    Social History:  reports that she quit smoking about 4 years ago. Her smoking use included cigarettes. She has a 13.00 pack-year smoking history. She has never used smokeless tobacco. She reports  that she does not drink alcohol or use drugs.  Family History:  Family History  Problem Relation Age of Onset  . Hypertension Mother   . Asthma Daughter   . Cancer Paternal Aunt   . Asthma Maternal Grandmother      Prior to Admission medications   Medication Sig Start Date End Date Taking? Authorizing Provider  albuterol (PROVENTIL HFA;VENTOLIN HFA) 108 (90 Base) MCG/ACT inhaler Inhale 2 puffs into the  lungs every 4 (four) hours as needed for wheezing or shortness of breath. 08/12/18  Yes April Rakes, MD  albuterol (PROVENTIL) (2.5 MG/3ML) 0.083% nebulizer solution Take 3 mLs (2.5 mg total) by nebulization every 6 (six) hours as needed for wheezing or shortness of breath. 08/12/18  Yes Newlin, Charlane Ferretti, MD  ipratropium (ATROVENT) 0.02 % nebulizer solution Take 2.5 mLs (0.5 mg total) by nebulization 4 (four) times daily. 10/05/18  Yes Pfeiffer, Jeannie Done, MD  ipratropium-albuterol (DUONEB) 0.5-2.5 (3) MG/3ML SOLN Inhale 3 ml two times daily and every 2 hours as needed for shortness of breath or wheezing Inhale 3 ml two times daily and every 2 hours as needed for shortness of breath or wheezing Patient taking differently: Inhale 3 mLs into the lungs every 2 (two) hours as needed (wheezing/shortness of breath).  05/13/18  Yes Lorin Glass, PA-C  apixaban (ELIQUIS) 5 MG TABS tablet Take 1 tablet (5 mg total) by mouth 2 (two) times daily. Patient not taking: Reported on 10/26/2018 08/12/18   April Rakes, MD  DULoxetine (CYMBALTA) 60 MG capsule Take 1 capsule (60 mg total) by mouth daily. Patient not taking: Reported on 10/26/2018 08/12/18   April Rakes, MD  ergocalciferol (DRISDOL) 1.25 MG (50000 UT) capsule Take 1 capsule (50,000 Units total) by mouth once a week. Patient not taking: Reported on 10/26/2018 05/26/18   April Rakes, MD  Ferrous Sulfate (IRON) 325 (65 Fe) MG TABS Take 1 tablet (325 mg total) by mouth every morning. Patient not taking: Reported on 10/26/2018 01/26/18   Argentina Donovan, PA-C  fluticasone [facility]) 50 MCG/ACT nasal spray Place 1 spray into both nostrils daily. Patient not taking: Reported on 10/26/2018 08/12/18   April Rakes, MD  furosemide (LASIX) 40 MG tablet Take 1 tablet (40 mg total) by mouth daily. Patient not taking: Reported on 10/26/2018 09/10/18   Ladell Pier, MD  gabapentin (NEURONTIN) 300 MG capsule Take 2 capsules (600 mg total) by mouth 2 (two)  times daily. Patient not taking: Reported on 10/26/2018 08/12/18   April Rakes, MD  guaiFENesin (MUCINEX) 600 MG 12 hr tablet Take 2 tablets (1,200 mg total) by mouth 2 (two) times daily. Patient not taking: Reported on 10/26/2018 05/20/18   Cristal Ford, DO  hydrALAZINE (APRESOLINE) 10 MG tablet Take 1 tablet (10 mg total) by mouth every 8 (eight) hours for 30 days. Patient not taking: Reported on 10/26/2018 08/04/18 10/26/27  [name], NP  metoprolol tartrate (LOPRESSOR) 50 MG tablet Take 1 tablet (50 mg total) by mouth 2 (two) times daily. Patient not taking: Reported on 10/26/2018 08/12/18   April Rakes, MD  mometasone-formoterol (DULERA) 200-5 MCG/ACT AERO Inhale 2 puffs into the lungs 2 (two) times daily. Patient not taking: Reported on 10/26/2018 07/16/18   April Rakes, MD  montelukast (SINGULAIR) 10 MG tablet Take 1 tablet (10 mg total) by mouth at bedtime. Patient not taking: Reported on 10/26/2018 08/12/18   April Rakes, MD  nicotine (NICODERM CQ - DOSED IN MG/24 HOURS) 21 mg/24hr patch Place 1 patch (21  mg total) onto the skin daily. Patient not taking: Reported on 10/26/2018 05/21/18   Cristal Ford, DO  omeprazole (PRILOSEC) 20 MG capsule Take 1 capsule (20 mg total) by mouth daily. Patient not taking: Reported on 10/26/2018 08/12/18   April Rakes, MD  potassium chloride SA (K-DUR,KLOR-CON) 20 MEQ tablet Take 1 tablet (20 mEq total) by mouth daily. Patient not taking: Reported on 10/26/2018 08/04/18   [name], NP  predniSONE (DELTASONE) 10 MG tablet TAKE 2 TABLETS BY MOUTH DAILY Patient not taking: Reported on 10/26/2018 10/19/18   Deneise Lever, MD  zolpidem (AMBIEN) 5 MG tablet TAKE 1 TABLET(5 MG) BY MOUTH AT BEDTIME Patient not taking: No sig reported 07/16/18   April Rakes, MD    Physical Exam: Vitals:   10/26/18 0500 10/26/18 0520 10/26/18 0535 10/26/18 0615  BP:      Pulse: 95     Resp: (!) 26     Temp:      TempSrc:      SpO2: 99% 99% 99% 99%   Weight:      Height:       General: Not in acute distress HEENT:       Eyes: PERRL, EOMI, no scleral icterus.       ENT: No discharge from the ears and nose, no pharynx injection, no tonsillar enlargement.        Neck: No JVD, no bruit, no mass felt. Heme: No neck lymph node enlargement. Cardiac: S1/S2, RRR, No murmurs, No gallops or rubs. Respiratory: Has diffused wheezing bilaterally.   GI: Soft, nondistended, nontender, no rebound pain, no organomegaly, BS present. GU: No hematuria Ext: No pitting leg edema bilaterally. 2+DP/PT pulse bilaterally. Musculoskeletal: No joint deformities, No joint redness or warmth, no limitation of ROM in spin. Skin: No rashes.  Neuro: Alert, oriented X3, cranial nerves II-XII grossly intact, moves all extremities normally.  Psych: Patient is not psychotic, no suicidal or hemocidal ideation.  Labs on Admission: I have personally reviewed following labs and imaging studies  CBC: Recent Labs  Lab 10/26/18 0055 10/26/18 0111  WBC 29.5*  --   NEUTROABS 24.5*  --   HGB 11.6* 12.9  HCT 37.8 38.0  MCV 90.4  --   PLT 464*  --    Basic Metabolic Panel: Recent Labs  Lab 10/26/18 0055 10/26/18 0111  NA 136 139  K 3.5 3.6  CL 101  --   CO2 26  --   GLUCOSE 124*  --   BUN 17  --   CREATININE 0.94  --   CALCIUM 8.4*  --    GFR: Estimated Creatinine Clearance: 119.7 mL/min (by C-G formula based on SCr of 0.94 mg/dL). Liver Function Tests: No results for input(s): AST, ALT, ALKPHOS, BILITOT, PROT, ALBUMIN in the last 168 hours. No results for input(s): LIPASE, AMYLASE in the last 168 hours. No results for input(s): AMMONIA in the last 168 hours. Coagulation Profile: No results for input(s): INR, PROTIME in the last 168 hours. Cardiac Enzymes: No results for input(s): CKTOTAL, CKMB, CKMBINDEX, TROPONINI in the last 168 hours. BNP (last 3 results) No results for input(s): PROBNP in the last 8760 hours. HbA1C: No results for input(s):  HGBA1C in the last 72 hours. CBG: No results for input(s): GLUCAP in the last 168 hours. Lipid Profile: No results for input(s): CHOL, HDL, LDLCALC, TRIG, CHOLHDL, LDLDIRECT in the last 72 hours. Thyroid Function Tests: No results for input(s): TSH, T4TOTAL, FREET4, T3FREE, THYROIDAB in the last  72 hours. Anemia Panel: No results for input(s): VITAMINB12, FOLATE, FERRITIN, TIBC, IRON, RETICCTPCT in the last 72 hours. Urine analysis:    Component Value Date/Time   COLORURINE YELLOW 10/13/2017 1100   APPEARANCEUR CLEAR 10/13/2017 1100   LABSPEC 1.005 10/13/2017 1100   PHURINE 5.0 10/13/2017 1100   GLUCOSEU NEGATIVE 10/13/2017 1100   GLUCOSEU NEG mg/dL 10/28/2007 2049   HGBUR SMALL (A) 10/13/2017 1100   BILIRUBINUR NEGATIVE 10/13/2017 1100   KETONESUR NEGATIVE 10/13/2017 1100   PROTEINUR NEGATIVE 10/13/2017 1100   UROBILINOGEN 1.0 11/21/2014 0707   NITRITE NEGATIVE 10/13/2017 1100   LEUKOCYTESUR SMALL (A) 10/13/2017 1100   Sepsis Labs: @LABRCNTIP (procalcitonin:4,lacticidven:4) ) Recent Results (from the past 240 hour(s))  SARS Coronavirus 2 (CEPHEID - Performed in [place] hospital lab), Hosp Order     Status: None   Collection Time: 10/26/18 12:45 AM   Specimen: Nasopharyngeal Swab  Result Value Ref Range Status   SARS Coronavirus 2 NEGATIVE NEGATIVE Final    Comment: (NOTE) If result is NEGATIVE SARS-CoV-2 target nucleic acids are NOT DETECTED. The SARS-CoV-2 RNA is generally detectable in upper and lower  respiratory specimens during the acute phase of infection. The lowest  concentration of SARS-CoV-2 viral copies this assay can detect is 250  copies / mL. A negative result does not preclude SARS-CoV-2 infection  and should not be used as the sole basis for treatment or other  patient management decisions.  A negative result may occur with  improper specimen collection / handling, submission of specimen other  than nasopharyngeal swab, presence of viral mutation(s)  within the  areas targeted by this assay, and inadequate number of viral copies  (<250 copies / mL). A negative result must be combined with clinical  observations, patient history, and epidemiological information. If result is POSITIVE SARS-CoV-2 target nucleic acids are DETECTED. The SARS-CoV-2 RNA is generally detectable in upper and lower  respiratory specimens dur ing the acute phase of infection.  Positive  results are indicative of active infection with SARS-CoV-2.  Clinical  correlation with patient history and other diagnostic information is  necessary to determine patient infection status.  Positive results do  not rule out bacterial infection or co-infection with other viruses. If result is PRESUMPTIVE POSTIVE SARS-CoV-2 nucleic acids MAY BE PRESENT.   A presumptive positive result was obtained on the submitted specimen  and confirmed on repeat testing.  While 2019 novel coronavirus  (SARS-CoV-2) nucleic acids may be present in the submitted sample  additional confirmatory testing may be necessary for epidemiological  and / or clinical management purposes  to differentiate between  SARS-CoV-2 and other Sarbecovirus currently known to infect humans.  If clinically indicated additional testing with an alternate test  methodology [phone]) is advised. The SARS-CoV-2 RNA is generally  detectable in upper and lower respiratory sp ecimens during the acute  phase of infection. The expected result is Negative. Fact Sheet for Patients:  StrictlyIdeas.no Fact Sheet for Healthcare Providers: BankingDealers.co.za This test is not yet approved or cleared by the [place] FDA and has been authorized for detection and/or diagnosis of SARS-CoV-2 by FDA under an Emergency Use Authorization (EUA).  This EUA will remain in effect (meaning this test can be used) for the duration of the COVID-19 declaration under Section 564(b)(1) of the Act,  21 U.S.C. section 360bbb-3(b)(1), unless the authorization is terminated or revoked sooner. Performed at [facility], [address] [place], [place] [address]      Radiological Exams on  Admission: Dg Chest Port 1 View  Result Date: 10/26/2018 CLINICAL DATA:  Shortness of breath EXAM: PORTABLE CHEST 1 VIEW COMPARISON:  10/05/2018 FINDINGS: Heart and mediastinal contours are within normal limits. No focal opacities or effusions. No acute bony abnormality. IMPRESSION: No active disease. Electronically Signed   By: Rolm Baptise M.D.   On: 10/26/2018 01:08     EKG: Independently reviewed.  Sinus rhythm, QTC 476, LAE, LAD, poor R wave progression, nonspecific T wave change.  Assessment/Plan Principal Problem:   COPD exacerbation (HCC) Active Problems:   Morbid obesity with body mass index of 50.0-59.9 in adult [facility])   Major depressive disorder, recurrent episode ([place])   Essential hypertension   OSA on CPAP   GERD (gastroesophageal reflux disease)   Chronic diastolic CHF (congestive heart failure) (HCC)   Asthma exacerbation   AF (paroxysmal atrial fibrillation) (HCC)   COPD exacerbation and asthma exacerbation: CXR negative. Covid 19 negative.  Has diffused wheezing.  -will place on tele bed for obs -->will change to SDU since pt continue to have persistent wheezing and shortness of breath - start BiPAP -Nebulizers: scheduled Duoneb and continuous albuterol nebulizer -Singulair and dulera inhaler -Solu-Medrol 60 mg IV tid -Z pak -Mucinex for cough  -Incentive spirometry -Follow up respiratory virus panel, Flu pcr -Nasal cannula oxygen as needed to maintain O2 saturation 93% or greater when off BiPAP  Morbid obesity with body mass index of 50.0-59.9 in adult [facility]) -Diet and exercise.   -Encouraged to lose weight.   Major depressive disorder, recurrent episode ([place]): -Cymbalta  Essential hypertension: not taking meds at home -restart Amlodipine and hydralazine  -IV hydralazine as needed  OSA - on CPAP when off BiPAP   Chronic diastolic CHF (congestive heart failure) ([place]): 2D echo on 05/17/2018 showed EF 60-85% with grade 2 diastolic dysfunction.  Patient does not have leg edema, no pulmonary edema chest x-ray etc. BNP 61.9. CHFis compensated. -Continue home Lasix.    AF (paroxysmal atrial fibrillation) ([place]): CHA2DS2-VASc Score is 3, needs oral anticoagulation. Patient is Eliquis at home, but not taking it recently.  Patient wanted to resume it. Heart rate is 90s. -Resume Eliquis -Restart metoprolol  GERD: -Protonix  Leukocytosis: WBC 29.5.  No signs of infection. Likely due to stress induced to demargination 2.2 to prednisone use. -follow up by CBC   DVT ppx: on eliquis Code Status: Full code Family Communication: None at bed side.  Disposition Plan:  Anticipate discharge back to previous home environment Consults called:  none Admission status: Obs / tele      Date of Service 10/26/2018    [facility]   If 7PM-7AM, please contact night-coverage [email or web address] Password [email or web address] 10/26/2018, 6:30 AM

## 2018-10-26 NOTE — ED Triage Notes (Signed)
BIB [facility] from home with c/o of SOB X 1 day. Hx of asthma and CHF. Received .3epi x 2 and 125 solu-medrol PTA.

## 2018-10-26 NOTE — Progress Notes (Signed)
Patient with audible expiratory wheezes after duoneb treatment, patient given several albuterol treatments with no improvement in condition. Patient cannot complete full sentences without being out of breathe, also with accessory muscle usage and nasal flaring also noticed. Rapid response was notified for a possible transfer to progressive unit for continuous monitoring possible and possible BiPAP. Will continue to monitor patient

## 2018-10-26 NOTE — Progress Notes (Signed)
MD Blaine Hamper paged regarding elevated BP of 180s/100s hydralize ordered, will give and continue to monitor .me RN 5:27 AM 10-26-2018

## 2018-10-26 NOTE — Telephone Encounter (Signed)
Checked pt's chart and pt is currently admitted in hospital due to her asthma.  Routing to CY as an [place].

## 2018-10-27 DIAGNOSIS — J9601 Acute respiratory failure with hypoxia: Secondary | ICD-10-CM | POA: Diagnosis not present

## 2018-10-27 DIAGNOSIS — J441 Chronic obstructive pulmonary disease with (acute) exacerbation: Secondary | ICD-10-CM | POA: Diagnosis not present

## 2018-10-27 IMAGING — DX DG CHEST 1V PORT
1 series · 1 of 1 positions shown · non-contrast
Comparison: CT chest and chest radiograph 10/08/2017.

CLINICAL DATA: Shortness of breath, cough and congestion for 4
days. Central chest pain.

EXAM:
PORTABLE CHEST 1 VIEW

[chest ap]
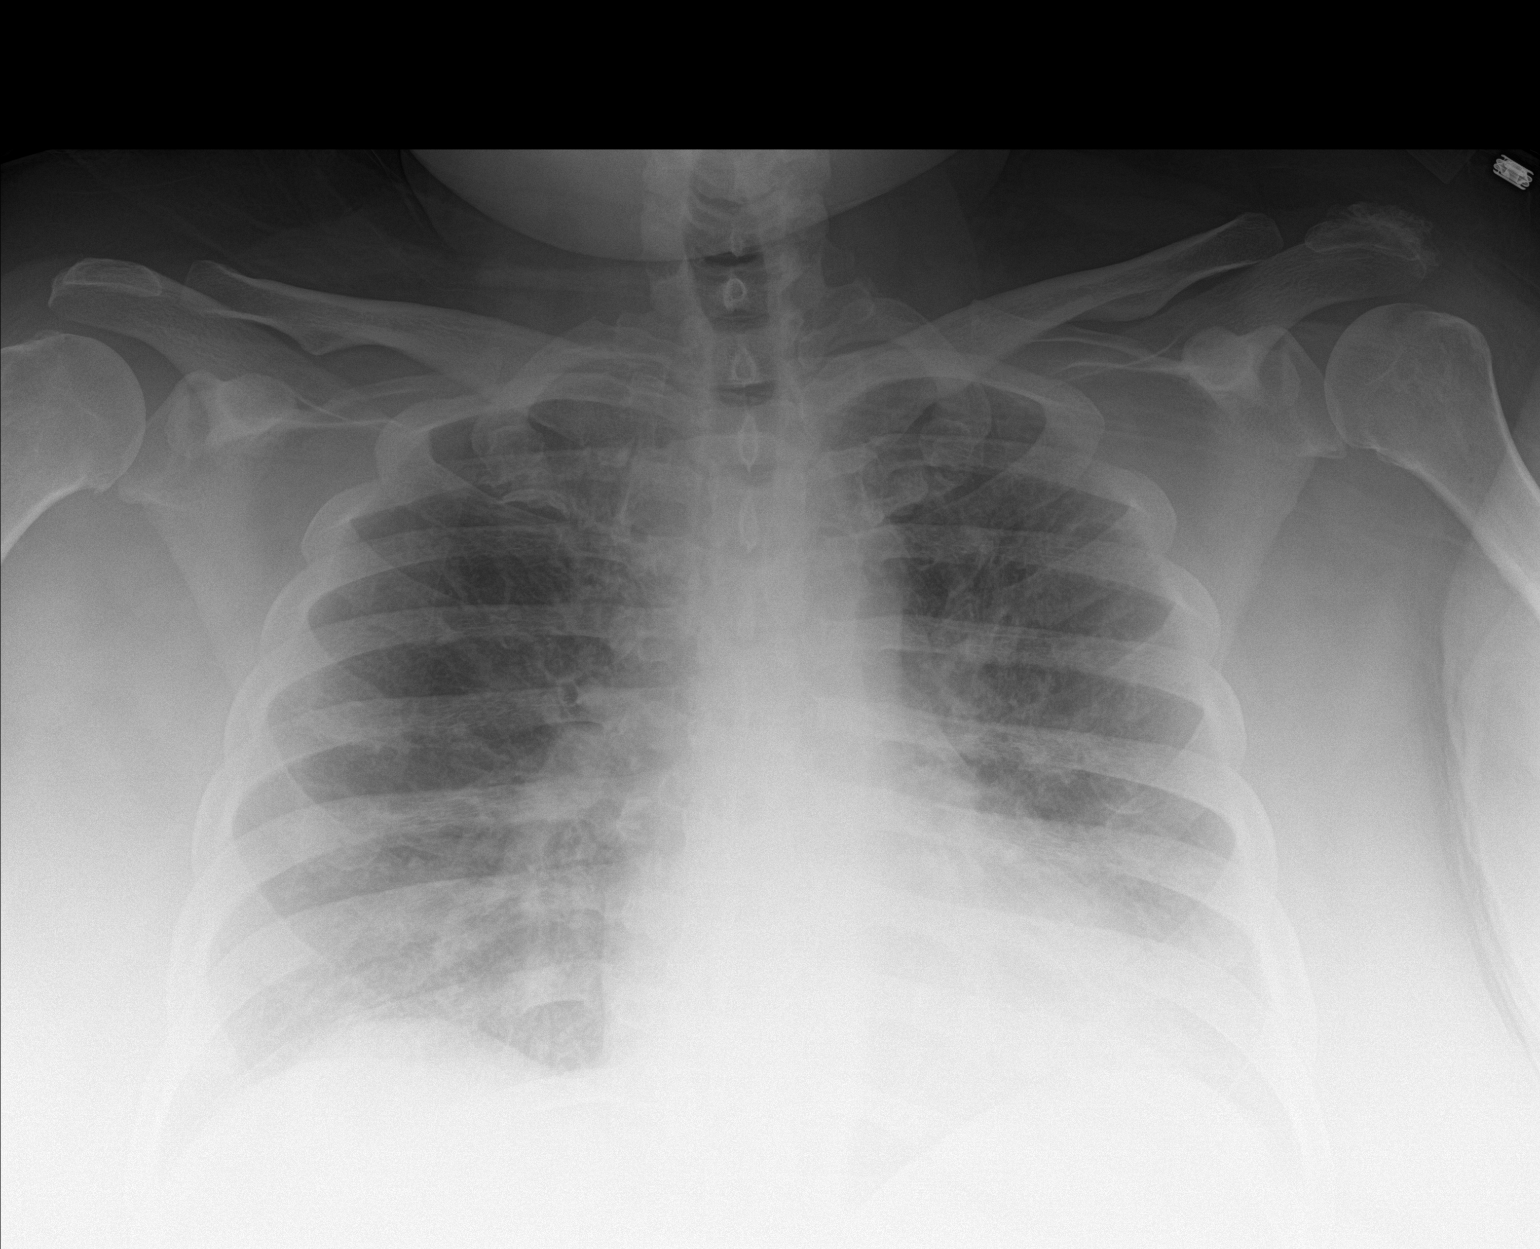

[1 of 1 positions shown; findings below may reference images not displayed]

FINDINGS: Trachea is midline. Heart size stable. Very mild interstitial
prominence. No definite airspace consolidation or pleural fluid on
this apical lordotic view.
IMPRESSION: 1. No definite acute findings.
2. Mild interstitial prominence, as on 10/08/2017.

## 2018-10-27 MED ORDER — PREDNISONE 20 MG PO TABS: ORAL_TABLET | ORAL | 0 refills | Status: DC

## 2018-10-27 MED ORDER — PREDNISONE 10 MG PO TABS: ORAL_TABLET | ORAL | 2 refills | Status: DC

## 2018-10-27 MED ORDER — NICOTINE 21 MG/24HR TD PT24
21.0000 mg | MEDICATED_PATCH | Freq: Every day | TRANSDERMAL | Status: DC
  Administered 2018-10-27: 21 mg via TRANSDERMAL
  Filled 2018-10-27: qty 1

## 2018-10-27 NOTE — Plan of Care (Signed)

## 2018-10-27 NOTE — Discharge Summary (Signed)
Physician Discharge Summary  April [name] VVO:[id number] DOB: December 15, 1970 DOA: 10/26/2018  PCP: [name], MD  Admit date: 10/26/2018 Discharge date: 10/27/2018  Time spent: 45 minutes  Recommendations for Outpatient Follow-up:  1. Follow up with primary pulmonologist in 1-2 weeks for evaluation of symptoms 2. Take medications as prescribed 3. No smoking   Discharge Diagnoses:  Principal Problem:   Acute respiratory failure with hypoxia (HCC) Active Problems:   Essential hypertension   Asthma exacerbation   AF (paroxysmal atrial fibrillation) (HCC)   COPD exacerbation (HCC)   Morbid obesity with body mass index of 50.0-59.9 in adult (HCC)   OSA on CPAP   GERD (gastroesophageal reflux disease)   Chronic diastolic CHF (congestive heart failure) (HCC)   Major depressive disorder, recurrent episode ([place])   Discharge Condition: stable  Diet recommendation: hear healthy carb modified  Filed Weights   10/26/18 0046 10/26/18 0448 10/27/18 0518  Weight: (!) 139.9 kg (!) 161.7 kg (!) 161.3 kg    History of present illness:  48 yo hx asthma not on home oxygen, copd, htn, dCHF, osa, ida, morbid obesity, afib admitted with acute respiratory failure with hypoxia due to copd/asthma exacerbation. Of note, patient intubated last year for same. Initially required bipap but could not tolerate. Oxygen saturation level 88% on room air with rate 36.  Hospital Course:    Acute respiratory failure with hypoxia/COPD exacerbation and asthma exacerbation: CXR negative. Covid 19 negative.  Has diffused wheezing. Oxygen saturation level 88% on room air at admission. She became more distressed and required bipap. Unable to tolerate. Provided with nebs, solu-medrol. Slowly improved. At discharge improved air movement. Oxygen saturation level greater than 90% on room air. Will discharge with prednisone taper down to maintenance dose of 20mg  daily. Has follow up appointment Dr Annamaria Boots. COVID 19 neg,  influenza panel neg, BNP 61.   Morbid obesity with body mass index of 50.0-59.9 in adult [facility]) -Diet and exercise.  -Encouraged to lose weight.  Major depressive disorder, recurrent episode ([place]): -Cymbalta  Essential hypertension: not taking meds at home -restart Amlodipine and hydralazine -IV hydralazine as needed  OSA - on CPAP when off BiPAP -is non-compliant while in hospital   Chronic diastolic CHF (congestive heart failure) ([place]): 2D echo on 05/17/2018 showed EF 60-85% with grade 2 diastolic dysfunction.  Patient without leg edema, no pulmonary edema chest x-ray etc. BNP 61.9. CHFis compensated. -Continue home Lasix.    AF (paroxysmal atrial fibrillation) ([place]): CHA2DS2-VASc Score is 3.  Eliquis and BB  at home. Rate controlled.   GERD: -Protonix  Leukocytosis: WBC 29.5.  No signs of infection. Likely due to stress induced to demargination 2.2 to prednisone use.   Procedures:    Consultations:    Discharge Exam: Vitals:   10/27/18 0758 10/27/18 0803  BP:    Pulse: 78   Resp: (!) 22   Temp:    SpO2: 96% 96%    General: sitting in chair. No acute distress Cardiovascular: irregularly irregular no mgr trace LE edema Respiratory: normal effort with conversation. BS remain somewhat distant but better than yesterday. No wheeze  Discharge Instructions   Discharge Instructions    Call MD for:  difficulty breathing, headache or visual disturbances   Complete by: As directed    Call MD for:  persistant dizziness or light-headedness   Complete by: As directed    Diet - low sodium heart healthy   Complete by: As directed    Discharge instructions   Complete by:  As directed    Take medications as prescribed.  Follow up with pulmonology as scheduled   Increase activity slowly   Complete by: As directed      Allergies as of 10/27/2018      Reactions   Contrast Media [iodinated Diagnostic Agents] Itching   Ct contrast      Medication List     TAKE these medications   albuterol 108 (90 Base) MCG/ACT inhaler Commonly known as: VENTOLIN HFA Inhale 2 puffs into the lungs every 4 (four) hours as needed for wheezing or shortness of breath.   albuterol (2.5 MG/3ML) 0.083% nebulizer solution Commonly known as: PROVENTIL Take 3 mLs (2.5 mg total) by nebulization every 6 (six) hours as needed for wheezing or shortness of breath.   apixaban 5 MG Tabs tablet Commonly known as: Eliquis Take 1 tablet (5 mg total) by mouth 2 (two) times daily.   DULoxetine 60 MG capsule Commonly known as: Cymbalta Take 1 capsule (60 mg total) by mouth daily.   ergocalciferol 1.25 MG (50000 UT) capsule Commonly known as: Drisdol Take 1 capsule (50,000 Units total) by mouth once a week.   fluticasone 50 MCG/ACT nasal spray Commonly known as: FLONASE Place 1 spray into both nostrils daily.   furosemide 40 MG tablet Commonly known as: LASIX Take 1 tablet (40 mg total) by mouth daily.   gabapentin 300 MG capsule Commonly known as: NEURONTIN Take 2 capsules (600 mg total) by mouth 2 (two) times daily.   guaiFENesin 600 MG 12 hr tablet Commonly known as: MUCINEX Take 2 tablets (1,200 mg total) by mouth 2 (two) times daily.   hydrALAZINE 10 MG tablet Commonly known as: APRESOLINE Take 1 tablet (10 mg total) by mouth every 8 (eight) hours for 30 days.   ipratropium 0.02 % nebulizer solution Commonly known as: ATROVENT Take 2.5 mLs (0.5 mg total) by nebulization 4 (four) times daily.   ipratropium-albuterol 0.5-2.5 (3) MG/3ML Soln Commonly known as: DUONEB Inhale 3 ml two times daily and every 2 hours as needed for shortness of breath or wheezing Inhale 3 ml two times daily and every 2 hours as needed for shortness of breath or wheezing What changed:   how much to take  how to take this  when to take this  reasons to take this  additional instructions   Iron 325 (65 Fe) MG Tabs Take 1 tablet (325 mg total) by mouth every morning.    metoprolol tartrate 50 MG tablet Commonly known as: LOPRESSOR Take 1 tablet (50 mg total) by mouth 2 (two) times daily.   mometasone-formoterol 200-5 MCG/ACT Aero Commonly known as: Dulera Inhale 2 puffs into the lungs 2 (two) times daily.   montelukast 10 MG tablet Commonly known as: SINGULAIR Take 1 tablet (10 mg total) by mouth at bedtime.   nicotine 21 mg/24hr patch Commonly known as: NICODERM CQ - dosed in mg/24 hours Place 1 patch (21 mg total) onto the skin daily.   omeprazole 20 MG capsule Commonly known as: PRILOSEC Take 1 capsule (20 mg total) by mouth daily.   potassium chloride SA 20 MEQ tablet Commonly known as: K-DUR Take 1 tablet (20 mEq total) by mouth daily.   predniSONE 20 MG tablet Commonly known as: Deltasone Starting 10/28/18 take 3 tabs daily for 2 days then take 2 tabs daily for 2 days then stop. What changed: You were already taking a medication with the same name, and this prescription was added. Make sure you understand how and  when to take each.   predniSONE 10 MG tablet Commonly known as: DELTASONE TAKE 2 TABLETS BY MOUTH DAILY starting 11/01/18 Start taking on: November 01, 2018 What changed:   additional instructions  These instructions start on November 01, 2018. If you are unsure what to do until then, ask your doctor or other care provider.   zolpidem 5 MG tablet Commonly known as: AMBIEN TAKE 1 TABLET(5 MG) BY MOUTH AT BEDTIME      Allergies  Allergen Reactions  . Contrast Media [Iodinated Diagnostic Agents] Itching    Ct contrast      The results of significant diagnostics from this hospitalization (including imaging, microbiology, ancillary and laboratory) are listed below for reference.    Significant Diagnostic Studies: Dg Chest Port 1 View  Result Date: 10/26/2018 CLINICAL DATA:  Shortness of breath EXAM: PORTABLE CHEST 1 VIEW COMPARISON:  10/05/2018 FINDINGS: Heart and mediastinal contours are within normal limits. No focal  opacities or effusions. No acute bony abnormality. IMPRESSION: No active disease. Electronically Signed   By: Rolm Baptise M.D.   On: 10/26/2018 01:08   Dg Chest Portable 1 View  Result Date: 10/05/2018 CLINICAL DATA:  Shortness of breath, cough EXAM: PORTABLE CHEST 1 VIEW COMPARISON:  08/02/2018 FINDINGS: Heart is borderline in size. Lungs clear. No effusions. No acute bony abnormality. IMPRESSION: No active disease. Electronically Signed   By: Rolm Baptise M.D.   On: 10/05/2018 12:09    Microbiology: Recent Results (from the past 240 hour(s))  SARS Coronavirus 2 (CEPHEID - Performed in [place] hospital lab), Hosp Order     Status: None   Collection Time: 10/26/18 12:45 AM   Specimen: Nasopharyngeal Swab  Result Value Ref Range Status   SARS Coronavirus 2 NEGATIVE NEGATIVE Final    Comment: (NOTE) If result is NEGATIVE SARS-CoV-2 target nucleic acids are NOT DETECTED. The SARS-CoV-2 RNA is generally detectable in upper and lower  respiratory specimens during the acute phase of infection. The lowest  concentration of SARS-CoV-2 viral copies this assay can detect is 250  copies / mL. A negative result does not preclude SARS-CoV-2 infection  and should not be used as the sole basis for treatment or other  patient management decisions.  A negative result may occur with  improper specimen collection / handling, submission of specimen other  than nasopharyngeal swab, presence of viral mutation(s) within the  areas targeted by this assay, and inadequate number of viral copies  (<250 copies / mL). A negative result must be combined with clinical  observations, patient history, and epidemiological information. If result is POSITIVE SARS-CoV-2 target nucleic acids are DETECTED. The SARS-CoV-2 RNA is generally detectable in upper and lower  respiratory specimens dur ing the acute phase of infection.  Positive  results are indicative of active infection with SARS-CoV-2.  Clinical   correlation with patient history and other diagnostic information is  necessary to determine patient infection status.  Positive results do  not rule out bacterial infection or co-infection with other viruses. If result is PRESUMPTIVE POSTIVE SARS-CoV-2 nucleic acids MAY BE PRESENT.   A presumptive positive result was obtained on the submitted specimen  and confirmed on repeat testing.  While 2019 novel coronavirus  (SARS-CoV-2) nucleic acids may be present in the submitted sample  additional confirmatory testing may be necessary for epidemiological  and / or clinical management purposes  to differentiate between  SARS-CoV-2 and other Sarbecovirus currently known to infect humans.  If clinically indicated additional testing with an  alternate test  methodology [phone]) is advised. The SARS-CoV-2 RNA is generally  detectable in upper and lower respiratory sp ecimens during the acute  phase of infection. The expected result is Negative. Fact Sheet for Patients:  StrictlyIdeas.no Fact Sheet for Healthcare Providers: BankingDealers.co.za This test is not yet approved or cleared by the [place] FDA and has been authorized for detection and/or diagnosis of SARS-CoV-2 by FDA under an Emergency Use Authorization (EUA).  This EUA will remain in effect (meaning this test can be used) for the duration of the COVID-19 declaration under Section 564(b)(1) of the Act, 21 U.S.C. section 360bbb-3(b)(1), unless the authorization is terminated or revoked sooner. Performed at [facility], [place] [address] [place], [place] [address]   Respiratory Panel by PCR     Status: None   Collection Time: 10/26/18  3:46 AM   Specimen: Nasopharyngeal Swab; Respiratory  Result Value Ref Range Status   Adenovirus NOT DETECTED NOT DETECTED Final   Coronavirus 229E NOT DETECTED NOT DETECTED Final    Comment: (NOTE) The Coronavirus on the Respiratory Panel,  DOES NOT test for the novel  Coronavirus (2019 nCoV)    Coronavirus HKU1 NOT DETECTED NOT DETECTED Final   Coronavirus NL63 NOT DETECTED NOT DETECTED Final   Coronavirus OC43 NOT DETECTED NOT DETECTED Final   Metapneumovirus NOT DETECTED NOT DETECTED Final   Rhinovirus / Enterovirus NOT DETECTED NOT DETECTED Final   Influenza A NOT DETECTED NOT DETECTED Final   Influenza B NOT DETECTED NOT DETECTED Final   Parainfluenza Virus 1 NOT DETECTED NOT DETECTED Final   Parainfluenza Virus 2 NOT DETECTED NOT DETECTED Final   Parainfluenza Virus 3 NOT DETECTED NOT DETECTED Final   Parainfluenza Virus 4 NOT DETECTED NOT DETECTED Final   Respiratory Syncytial Virus NOT DETECTED NOT DETECTED Final   Bordetella pertussis NOT DETECTED NOT DETECTED Final   Chlamydophila pneumoniae NOT DETECTED NOT DETECTED Final   Mycoplasma pneumoniae NOT DETECTED NOT DETECTED Final    Comment: Performed at [facility], [place]. [address], [place], [place] [address]     Labs: Basic Metabolic Panel: Recent Labs  Lab 10/26/18 0055 10/26/18 0111  NA 136 139  K 3.5 3.6  CL 101  --   CO2 26  --   GLUCOSE 124*  --   BUN 17  --   CREATININE 0.94  --   CALCIUM 8.4*  --    Liver Function Tests: No results for input(s): AST, ALT, ALKPHOS, BILITOT, PROT, ALBUMIN in the last 168 hours. No results for input(s): LIPASE, AMYLASE in the last 168 hours. No results for input(s): AMMONIA in the last 168 hours. CBC: Recent Labs  Lab 10/26/18 0055 10/26/18 0111  WBC 29.5*  --   NEUTROABS 24.5*  --   HGB 11.6* 12.9  HCT 37.8 38.0  MCV 90.4  --   PLT 464*  --    Cardiac Enzymes: No results for input(s): CKTOTAL, CKMB, CKMBINDEX, TROPONINI in the last 168 hours. BNP: BNP (last 3 results) Recent Labs    05/17/18 0447 08/02/18 2013 10/26/18 0055  BNP 176.5* 32.0 61.9    ProBNP (last 3 results) No results for input(s): PROBNP in the last 8760 hours.  CBG: No results for input(s): GLUCAP in the last 168  hours.     SignedRadene Gunning NP [facility] 10/27/2018, 1:49 PM

## 2018-10-27 NOTE — Telephone Encounter (Signed)
Patient states would like to know when she can start the Dupixent injections.  Patient phone number is [phone].

## 2018-10-28 ENCOUNTER — Telehealth: Payer: Self-pay

## 2018-10-28 NOTE — Telephone Encounter (Signed)
Pt was discharged from the hospital and has been prescribed prednisone by NP from hospital. Nothing further needed.

## 2018-10-28 NOTE — Telephone Encounter (Signed)
Per the pt's summary of benefits, Dupixent needs a PA. PA has been started on Cover My Meds >> Key: [id number] Will follow up on PA.  Spoke with pt. She is aware of this information.

## 2018-10-28 NOTE — Telephone Encounter (Addendum)
Transition Care Management Follow-up Telephone Call  Date of discharge and from where: 10/27/2018 , [facility]   How have you been since you were released from the hospital? She said that she is feeling much better than she was in the hospital.   Any questions or concerns? Her only question was regarding PCS.  She explained that she lost her medicaid at one point and therefore PCS stopped. She now has medicaid re-instated and would like PCS again.  Informed her that Dr Margarita Rana would be notified of the request  Items Reviewed:  Did the pt receive and understand the discharge instructions provided? Yes and she had no questions.   Medications obtained and verified? She stated that she has all medications and understands that the only new order is a prednisone taper. She said that she did not need to review her medication list/instructions and did not have any questions about her medications. She mentioned that she was not able to get a refill of her ambien yesterday. Informed her that a refill was sent to her pharmacy last night.   Any new allergies since your discharge? None reported   Do you have support at home? Lives alone. First floor apartment with 3 steps to enter. Has limited support from her sister and daughter.    Other (ie: DME, Home Health, etc) she has a CPAP machine but will not use it because it is not comfortable.  She would not use BiPAP in the hospital.   She has a walker and cane and uses when needed.   Has nebulizer and said that she has been using it since she has been home.   Functional Questionnaire: (I = Independent and D = Dependent) ADL'sindependent   Follow up appointments reviewed:    PCP Hospital f/u appt confirmed? Scheduled for 11/09/2018 @ 1430.  She requested an afternoon appt.   [facility] f/u appt confirmed? She stated that she needs to call Dr Annamaria Boots to schedule a pulmonary appointment. She also mentioned that she hopes to start  dupixent  Are transportation arrangements needed? No problem with transportation reported.   If their condition worsens, is the pt aware to call  their PCP or go to the ED? Yes , she is aware  Was the patient provided with contact information for the PCP's office or ED? She has the clinic phone number  Was the pt encouraged to call back with questions or concerns? yes

## 2018-10-28 NOTE — Telephone Encounter (Signed)
Dr Margarita Rana - FYI from discharge call.   Her only question was regarding PCS.  She explained that she lost her medicaid at one point and therefore PCS stopped. She now has medicaid re-instated and would like PCS again.  Informed her that Dr Margarita Rana would be notified of the request

## 2018-10-29 ENCOUNTER — Telehealth: Payer: Self-pay

## 2018-10-29 IMAGING — CT CT NECK W/O CM
4 of 5 series · 16 of 33 positions shown, 18 images · non-contrast
Comparison: None.

CLINICAL DATA: Sore throat.  Contrast allergy.

EXAM:
CT NECK WITHOUT CONTRAST
TECHNIQUE: Multidetector CT imaging of the neck was performed following the
standard protocol without intravenous contrast.

[Series 2: axial neck · axial · 0.48mm/px · z∈[+1534,+1670]mm · 4 of 114 slices shown, 5 images]
[im 23/114  soft-tissue]
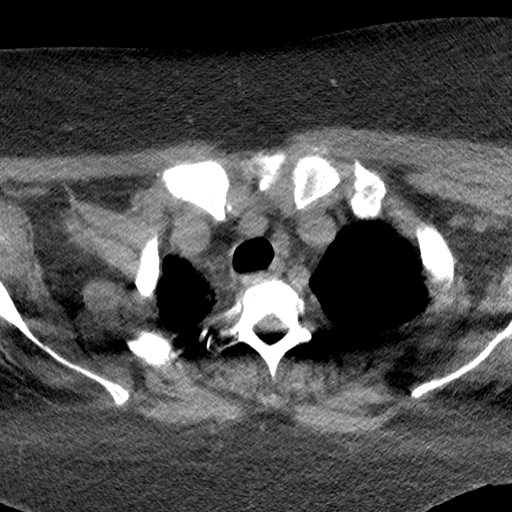
[im 23/114  bone]
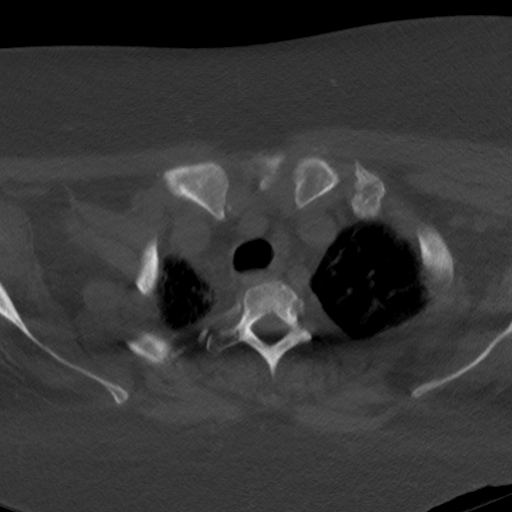
[im 46/114  bone]
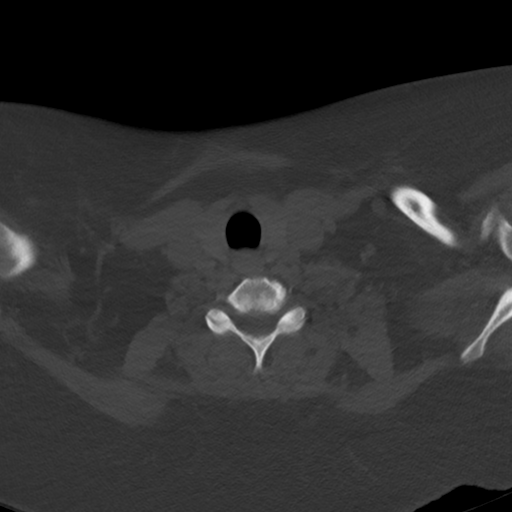
[im 68/114  bone]
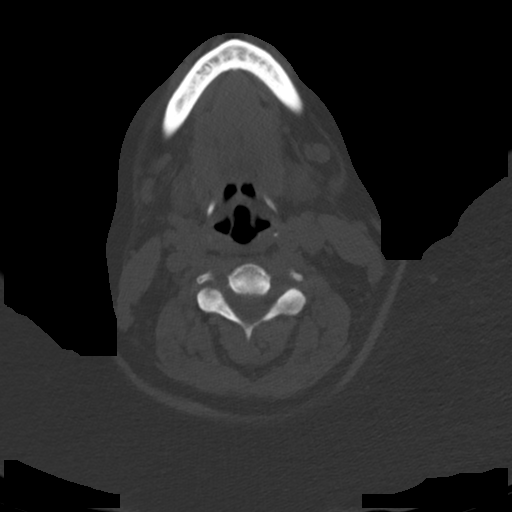
[im 91/114  bone]
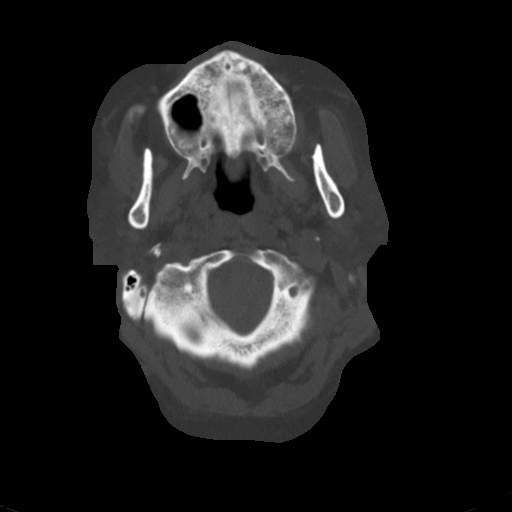

[Series 6: orthogonal ax · axial · 0.42mm/px · z∈[+1505,+1640]mm · 4 of 117 slices shown]
[im 24/117  bone]
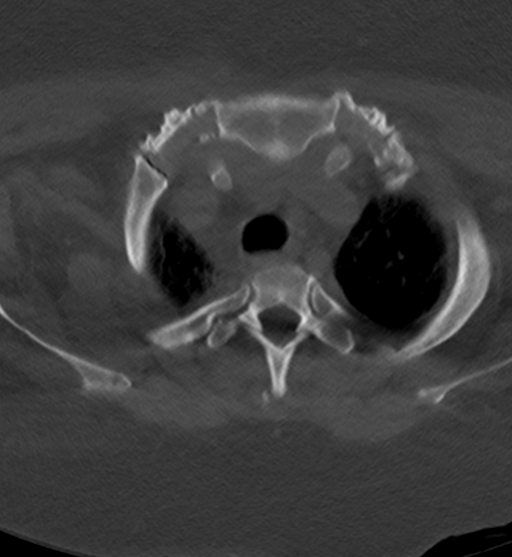
[im 47/117  bone]
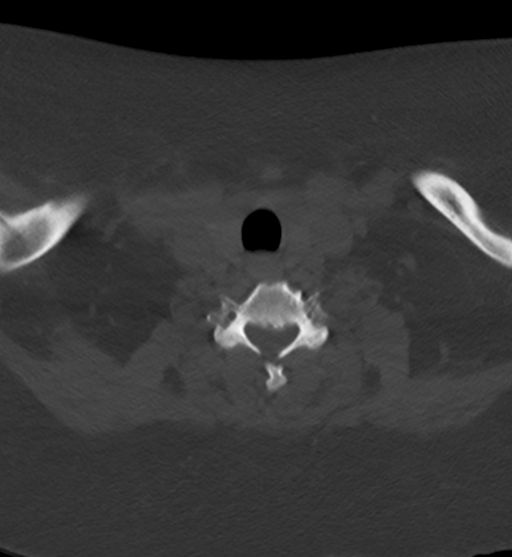
[im 70/117  bone]
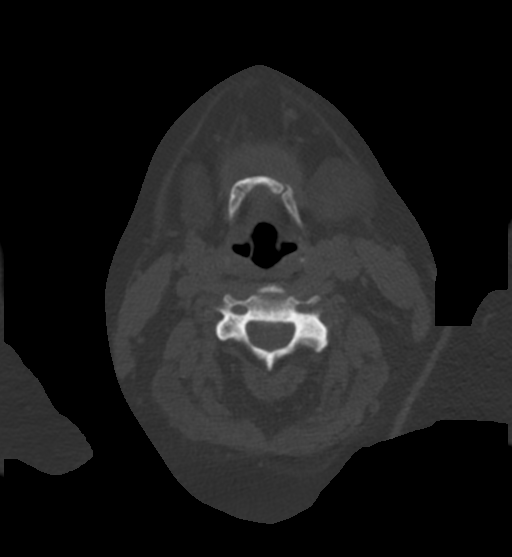
[im 93/117  bone]
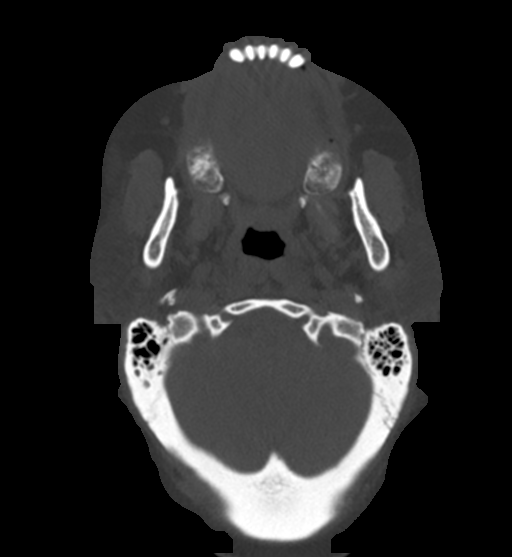

[Series 7: cor neck · coronal · 0.44mm/px · 3 of 105 slices shown]
[im 21/105  bone]
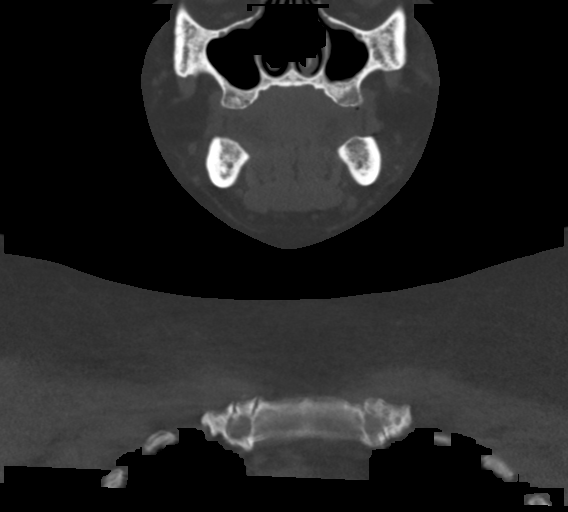
[im 42/105  bone]
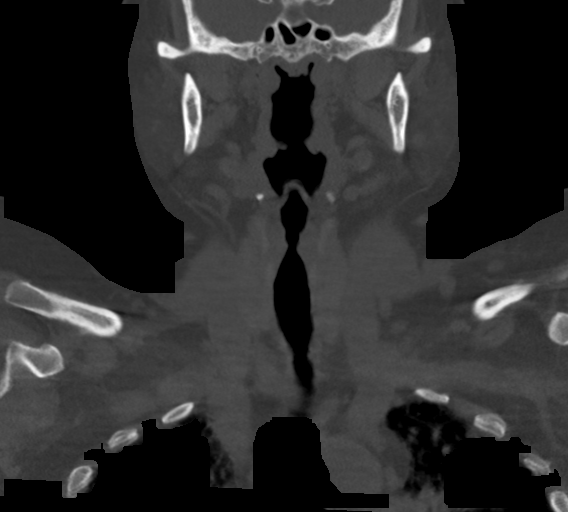
[im 63/105  bone]
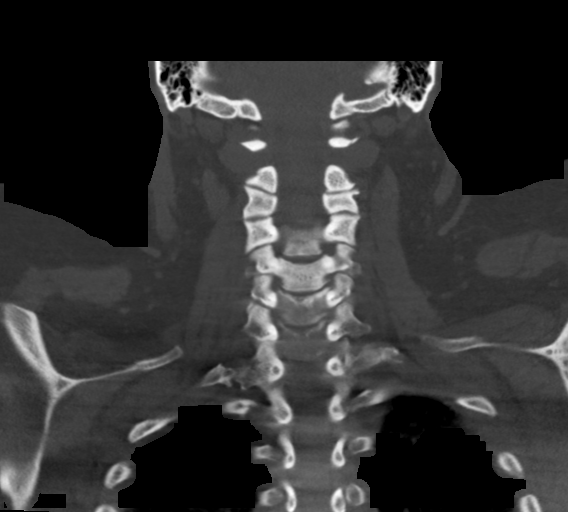

[Series 8: sag neck · sagittal · 0.44mm/px · 5 of 101 slices shown, 6 images]
[im 34/101  bone]
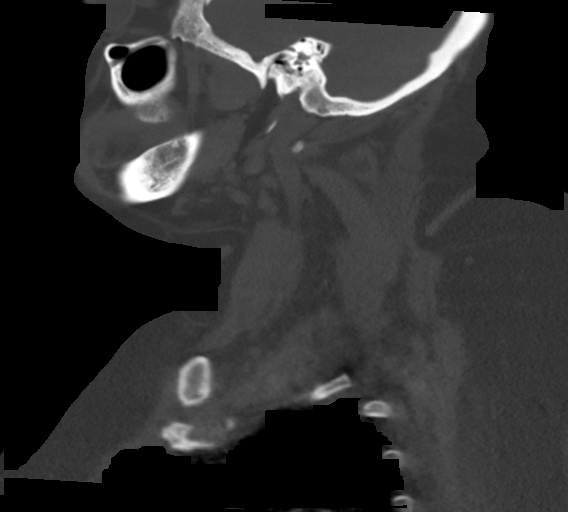
[im 42/101  bone]
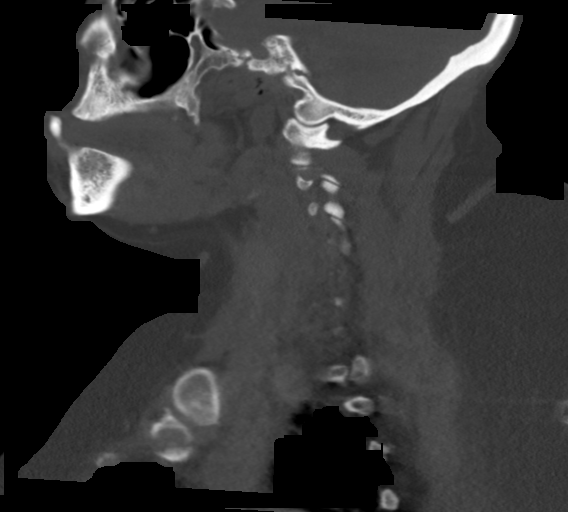
[im 51/101  soft-tissue]
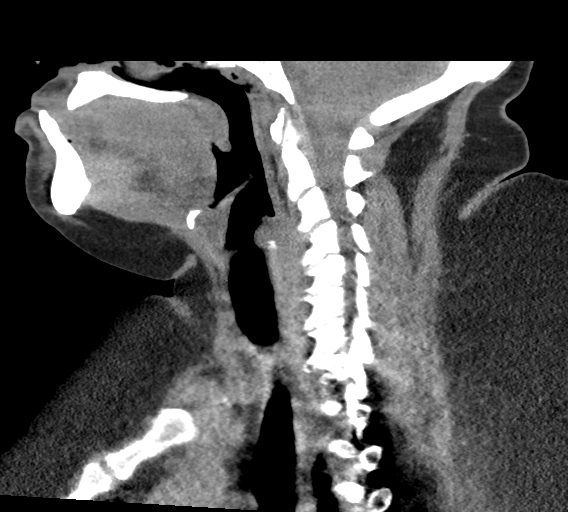
[im 51/101  bone]
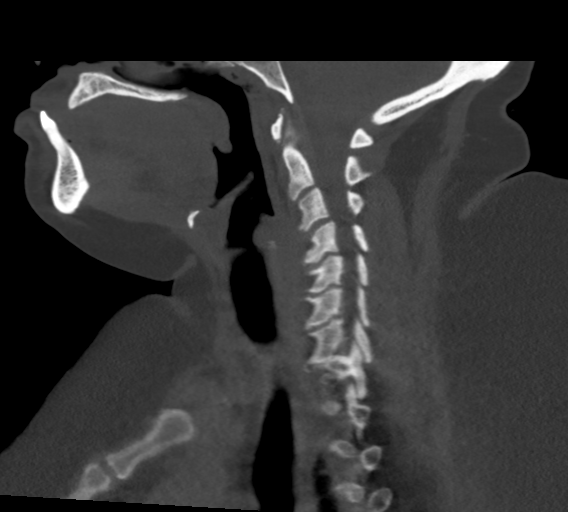
[im 59/101  bone]
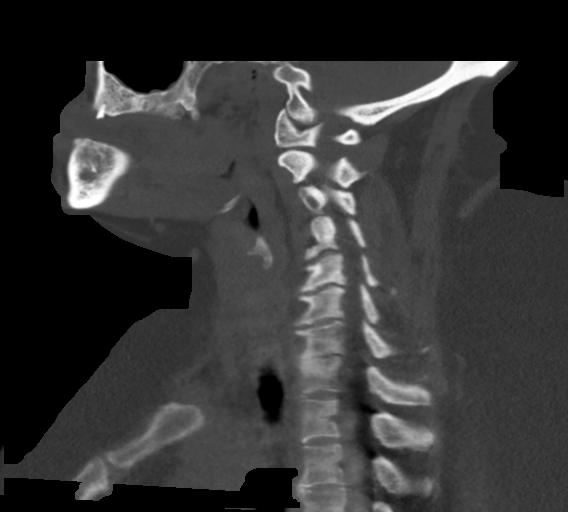
[im 67/101  bone]
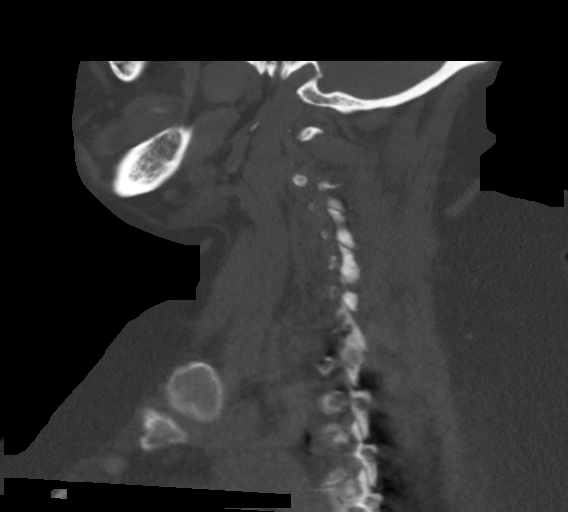

[16 of 33 positions shown; findings below may reference images not displayed]

FINDINGS: The patient was unable to remain motionless for the exam. Small or
subtle lesions could be overlooked.

Pharynx and larynx: Normal. No mass or swelling.

Salivary glands: No inflammation, mass or stone.

Thyroid: Normal.

Lymph nodes: None enlarged or abnormal density.

Vascular:   Suspected mild atherosclerosis.

Limited intracranial: Negative.

Visualized orbits: Negative.

Mastoids and visualized paranasal sinuses: Negative.

Skeleton: Spondylosis.  Largely edentulous.

Upper chest: Respiratory degraded.  No visible pneumothorax.

Other: None.
IMPRESSION: Noncontrast CT of the neck demonstrates no mass or airway
compromise.

## 2018-10-29 NOTE — Telephone Encounter (Signed)
Completed PCS form faxed to [facility] 

## 2018-10-30 IMAGING — DX DG CHEST 1V PORT
1 series · 1 of 1 positions shown · non-contrast
Comparison: Radiograph October 11, 2017.

CLINICAL DATA: Leukocytosis.

EXAM:
PORTABLE CHEST 1 VIEW

[chest ap]
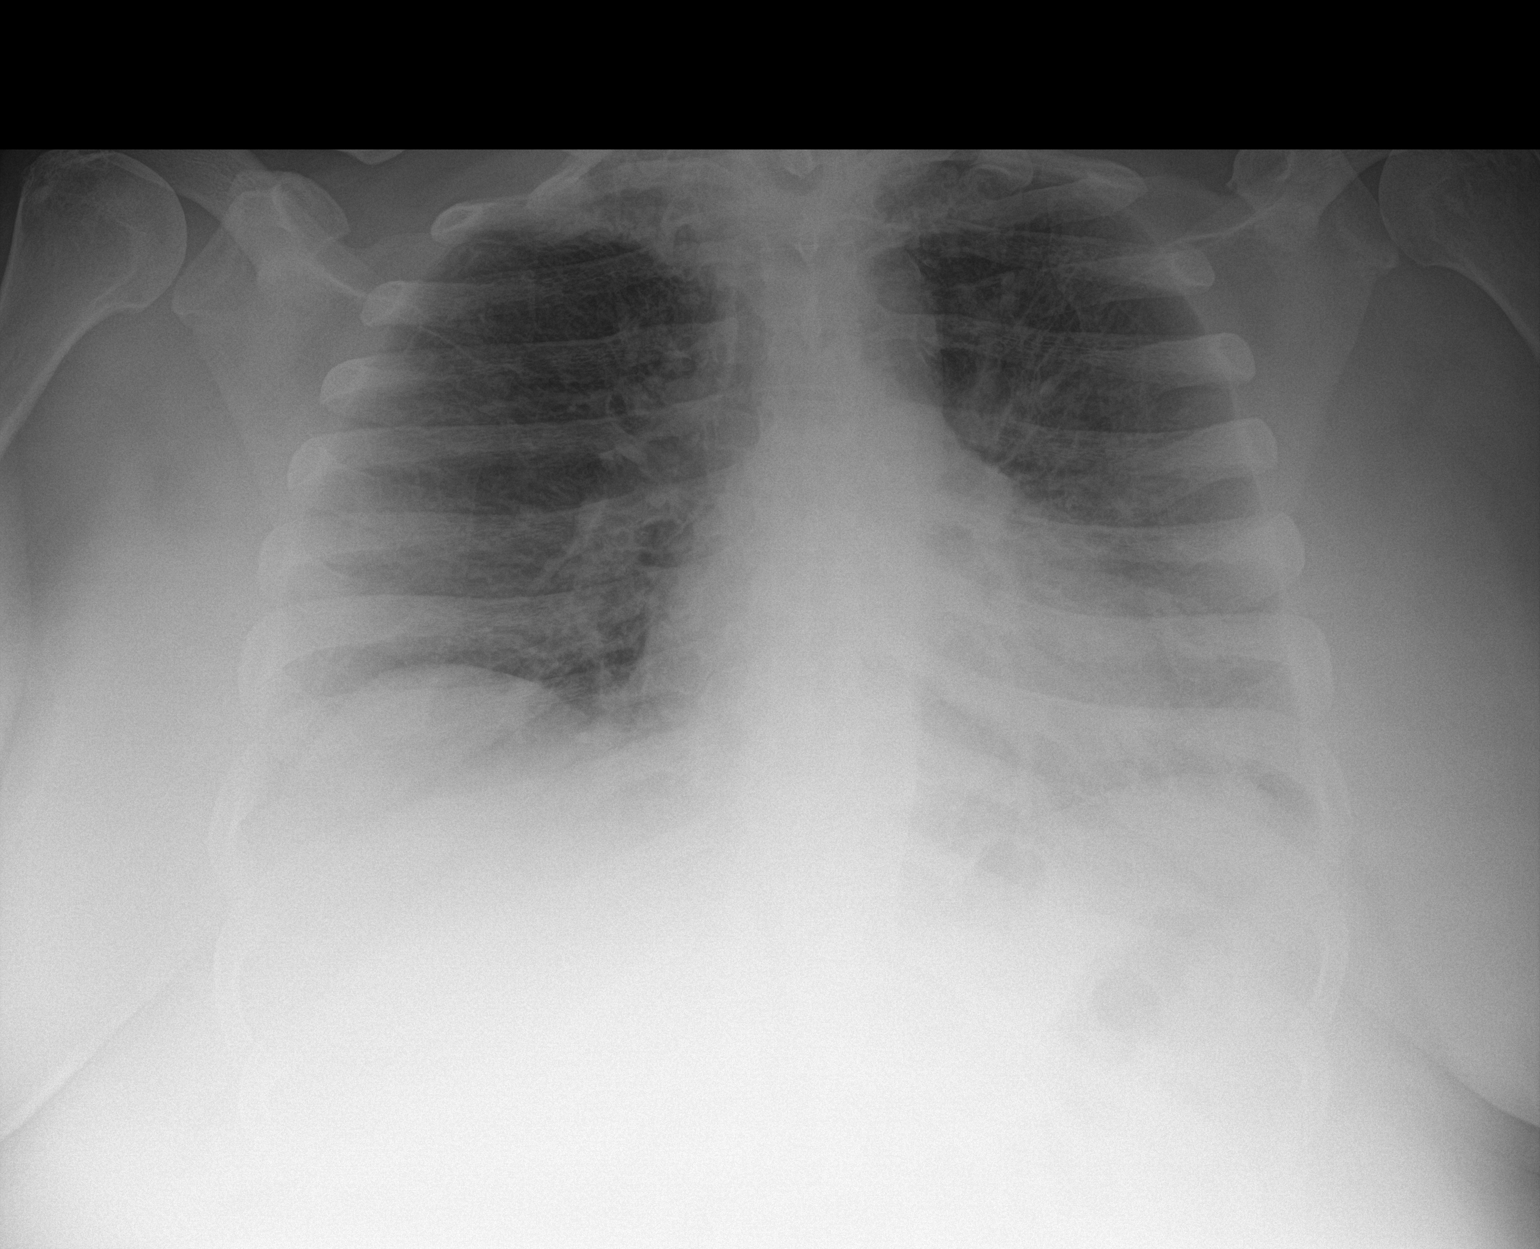

[1 of 1 positions shown; findings below may reference images not displayed]

FINDINGS: The heart size and mediastinal contours are within normal limits. No
pneumothorax or pleural effusion is noted. Lungs are clear. The
visualized skeletal structures are unremarkable.
IMPRESSION: No acute cardiopulmonary abnormality seen.

## 2018-11-02 NOTE — Telephone Encounter (Signed)
Checked Cover My Meds. PA is still pending at this time. Will follow up tomorrow.

## 2018-11-03 NOTE — Telephone Encounter (Signed)
Checked Cover My Meds, no determination has been made. Contacted [employer] at [phone] to check the status of PA. Spoke with [employer]. He advised me that I would need to speak to someone with [employer] [place] to help with this. Their phone number is [phone]. Spoke with Kia. She states that they do not have a PA on file for the pt's Dupixent. A Key was created for me to do the PA again on Cover My Meds >> [id number]

## 2018-11-05 ENCOUNTER — Other Ambulatory Visit: Payer: Self-pay | Admitting: Internal Medicine

## 2018-11-05 NOTE — Telephone Encounter (Signed)
April [name] from [place] states needs labwork records for [employer].  They sent a fax over yesterday for the request.  April [name] phone number is [phone]. Fax number [phone].

## 2018-11-06 NOTE — Telephone Encounter (Signed)
These documents were faxed to [facility] on 11/05/2018.

## 2018-11-09 ENCOUNTER — Ambulatory Visit: Payer: Medicare Other | Attending: Family Medicine | Admitting: Family Medicine

## 2018-11-09 ENCOUNTER — Encounter: Payer: Self-pay | Admitting: Family Medicine

## 2018-11-09 ENCOUNTER — Other Ambulatory Visit: Payer: Self-pay

## 2018-11-09 DIAGNOSIS — R232 Flushing: Secondary | ICD-10-CM

## 2018-11-09 DIAGNOSIS — G629 Polyneuropathy, unspecified: Secondary | ICD-10-CM

## 2018-11-09 DIAGNOSIS — J4541 Moderate persistent asthma with (acute) exacerbation: Secondary | ICD-10-CM

## 2018-11-09 MED ORDER — PREDNISONE 50 MG PO TABS: 50.0000 mg | ORAL_TABLET | Freq: Every day | ORAL | 0 refills | Status: DC

## 2018-11-09 MED ORDER — ALBUTEROL SULFATE HFA 108 (90 BASE) MCG/ACT IN AERS: 2.0000 | INHALATION_SPRAY | RESPIRATORY_TRACT | 3 refills | Status: DC | PRN

## 2018-11-09 MED ORDER — ALBUTEROL SULFATE (2.5 MG/3ML) 0.083% IN NEBU: 2.5000 mg | INHALATION_SOLUTION | Freq: Four times a day (QID) | RESPIRATORY_TRACT | 1 refills | Status: DC | PRN

## 2018-11-09 MED ORDER — GABAPENTIN 300 MG PO CAPS: 900.0000 mg | ORAL_CAPSULE | Freq: Two times a day (BID) | ORAL | 3 refills | Status: DC

## 2018-11-09 MED ORDER — CLONIDINE HCL 0.1 MG PO TABS: 0.1000 mg | ORAL_TABLET | Freq: Every evening | ORAL | 3 refills | Status: DC | PRN

## 2018-11-09 NOTE — Progress Notes (Signed)
Virtual Visit via Telephone Note  I connected with April [name], on 11/09/2018 at 2:23 PM by telephone due to the COVID-19 pandemic and verified that I am speaking with the correct person using two identifiers.   Consent: I discussed the limitations, risks, security and privacy concerns of performing an evaluation and management service by telephone and the availability of in person appointments. I also discussed with the patient that there may be a patient responsible charge related to this service. The patient expressed understanding and agreed to proceed.   Location of Patient: Home  Location of Provider: Clinic   Persons participating in Telemedicine visit: Johnica N Analy Bassford Farrington-CMA Dr. Felecia Shelling     History of Present Illness: April [name] is a 48 year old female with a history of morbid obesity, major depressive disorder, hypertension, COPD/ Asthma , obstructive sleep apnea (on CPAP) with multiple hospitalizations for asthma exacerbation/COPD , steroid-induced diabetes (previously on metformin) history of ARDS, influenza and health associated pneumonia requiring intubation, tracheostomy and ECMO at [facility] seen for follow-up visit. She had a hospitalization at [facility] from 10/26/2018 through 10/27/2018 for asthma exacerbation   She was placed on oxygen, nebulizer treatments, Solu-Medrol and tested negative for COVID-19.  Chest x-ray revealed no active disease.  Also commenced on prednisone with improvement in her condition after which she was subsequently discharged on a prednisone taper which she completed.  Today she informs me "my asthma is acting up".  She is having to use her nebulizer more and told me she took 3 prednisone tablets today with improvement in her symptoms but is unsure of the milligrams.  She complains of nasal stuffiness, but has no wheezing and no dyspnea at rest but dyspnea on exertion.  She is yet to see her  pulmonologist Dr. Annamaria Boots for follow-up. She complains of hot flashes which are worse at night and have been intermittent. Her neuropathy is sometimes controlled at other times uncontrolled despite  taking 600 mg of gabapentin and she has to increase her evening dose of gabapentin to 900 mg.  Past Medical History:  Diagnosis Date  . Acanthosis nigricans   . Anxiety   . Arthritis    "knees" (04/28/2017)  . Asthma    Followed by Dr. Annamaria Boots (pulmonology); receives every other week omalizumab injections; has frequent exacerbations  . Chronic diastolic CHF (congestive heart failure) ([place]) 01/17/2017  . COPD (chronic obstructive pulmonary disease) ([place])    PFTs in 2002, FEV1/FVC 65, no post bronchodilater test done  . Depression   . GERD (gastroesophageal reflux disease)   . Headache(784.0)    "q couple days" (04/28/2017)  . Helicobacter pylori (H. pylori) infection   . Hypertension, essential   . Insomnia   . Menorrhagia   . Morbid obesity ([place])   . OSA on CPAP    Sleep study 2008 - mild OSA, not enough events to titrate CPAP; wears CPAP now/pt on 04/28/2017  . Pneumonia X 1  . Seasonal allergies   . Shortness of breath   . Tobacco user    Allergies  Allergen Reactions  . Contrast Media [Iodinated Diagnostic Agents] Itching    Ct contrast    Current Outpatient Medications on File Prior to Visit  Medication Sig Dispense Refill  . albuterol (PROVENTIL HFA;VENTOLIN HFA) 108 (90 Base) MCG/ACT inhaler Inhale 2 puffs into the lungs every 4 (four) hours as needed for wheezing or shortness of breath. 1 Inhaler 3  . albuterol (PROVENTIL) (2.5 MG/3ML) 0.083%  nebulizer solution Take 3 mLs (2.5 mg total) by nebulization every 6 (six) hours as needed for wheezing or shortness of breath. 75 mL 1  . apixaban (ELIQUIS) 5 MG TABS tablet Take 1 tablet (5 mg total) by mouth 2 (two) times daily. 60 tablet 3  . DULoxetine (CYMBALTA) 60 MG capsule Take 1 capsule (60 mg total) by mouth daily. 30 capsule 3   . fluticasone (FLONASE) 50 MCG/ACT nasal spray Place 1 spray into both nostrils daily. 30 g 2  . furosemide (LASIX) 40 MG tablet TAKE 1 TABLET(40 MG) BY MOUTH DAILY 30 tablet 0  . gabapentin (NEURONTIN) 300 MG capsule Take 2 capsules (600 mg total) by mouth 2 (two) times daily. 120 capsule 3  . hydrALAZINE (APRESOLINE) 10 MG tablet Take 1 tablet (10 mg total) by mouth every 8 (eight) hours for 30 days. 90 tablet 1  . ipratropium (ATROVENT) 0.02 % nebulizer solution Take 2.5 mLs (0.5 mg total) by nebulization 4 (four) times daily. 75 mL 12  . ipratropium-albuterol (DUONEB) 0.5-2.5 (3) MG/3ML SOLN Inhale 3 ml two times daily and every 2 hours as needed for shortness of breath or wheezing Inhale 3 ml two times daily and every 2 hours as needed for shortness of breath or wheezing (Patient taking differently: Inhale 3 mLs into the lungs every 2 (two) hours as needed (wheezing/shortness of breath). ) 360 mL 0  . metoprolol tartrate (LOPRESSOR) 50 MG tablet Take 1 tablet (50 mg total) by mouth 2 (two) times daily. 60 tablet 3  . mometasone-formoterol (DULERA) 200-5 MCG/ACT AERO Inhale 2 puffs into the lungs 2 (two) times daily. 1 Inhaler 3  . montelukast (SINGULAIR) 10 MG tablet Take 1 tablet (10 mg total) by mouth at bedtime. 30 tablet 3  . omeprazole (PRILOSEC) 20 MG capsule Take 1 capsule (20 mg total) by mouth daily. 30 capsule 3  . zolpidem (AMBIEN) 5 MG tablet TAKE 1 TABLET(5 MG) BY MOUTH AT BEDTIME 30 tablet 2  . ergocalciferol (DRISDOL) 1.25 MG (50000 UT) capsule Take 1 capsule (50,000 Units total) by mouth once a week. (Patient not taking: Reported on 11/09/2018) 9 capsule 1  . Ferrous Sulfate (IRON) 325 (65 Fe) MG TABS Take 1 tablet (325 mg total) by mouth every morning. (Patient not taking: Reported on 10/26/2018) 30 each 0  . guaiFENesin (MUCINEX) 600 MG 12 hr tablet Take 2 tablets (1,200 mg total) by mouth 2 (two) times daily. (Patient not taking: Reported on 10/26/2018) 14 tablet 0  . nicotine  (NICODERM CQ - DOSED IN MG/24 HOURS) 21 mg/24hr patch Place 1 patch (21 mg total) onto the skin daily. (Patient not taking: Reported on 11/09/2018) 28 patch 0  . potassium chloride SA (K-DUR,KLOR-CON) 20 MEQ tablet Take 1 tablet (20 mEq total) by mouth daily. (Patient not taking: Reported on 10/26/2018) 30 tablet 3  . predniSONE (DELTASONE) 10 MG tablet TAKE 2 TABLETS BY MOUTH DAILY starting 11/01/18 (Patient not taking: Reported on 11/09/2018) 60 tablet 2  . predniSONE (DELTASONE) 20 MG tablet Starting 10/28/18 take 3 tabs daily for 2 days then take 2 tabs daily for 2 days then stop. (Patient not taking: Reported on 11/09/2018) 10 tablet 0   No current facility-administered medications on file prior to visit.     Observations/Objective: Awake, alert, oriented x3 Not in acute distress  Assessment and Plan: 1. Moderate persistent asthma with acute exacerbation Uncontrolled -high risk patient with multiple ICU admissions  Placed on short course of prednisone; if symptoms do not  improve within the next 24 hours she has been advised to go to the ED Advised to make appointment with pulmonologist - albuterol (PROVENTIL) (2.5 MG/3ML) 0.083% nebulizer solution; Take 3 mLs (2.5 mg total) by nebulization every 6 (six) hours as needed for wheezing or shortness of breath.  Dispense: 75 mL; Refill: 1 - albuterol (VENTOLIN HFA) 108 (90 Base) MCG/ACT inhaler; Inhale 2 puffs into the lungs every 4 (four) hours as needed for wheezing or shortness of breath.  Dispense: 18 g; Refill: 3 - predniSONE (DELTASONE) 50 MG tablet; Take 1 tablet (50 mg total) by mouth daily with breakfast.  Dispense: 5 tablet; Refill: 0  2. Neuropathy Uncontrolled Increase gabapentin dose - gabapentin (NEURONTIN) 300 MG capsule; Take 3 capsules (900 mg total) by mouth 2 (two) times daily.  Dispense: 180 capsule; Refill: 3  3. Hot flashes Her symptoms are uncontrolled despite remaining on gabapentin Clonidine added to regimen Blood  pressure at discharge was elevated hence addition of clonidine will be beneficial - cloNIDine (CATAPRES) 0.1 MG tablet; Take 1 tablet (0.1 mg total) by mouth at bedtime as needed. For hot flashes  Dispense: 30 tablet; Refill: 3   Follow Up Instructions: Advised to schedule appointment with pulmonary   I discussed the assessment and treatment plan with the patient. The patient was provided an opportunity to ask questions and all were answered. The patient agreed with the plan and demonstrated an understanding of the instructions.   The patient was advised to call back or seek an in-person evaluation if the symptoms worsen or if the condition fails to improve as anticipated.     I provided 16 minutes total of non-face-to-face time during this encounter including median intraservice time, reviewing previous notes, labs, imaging, medications, management and patient verbalized understanding.     [name], MD, FAAFP. [facility] [place], [place]   11/09/2018, 2:23 PM

## 2018-11-09 NOTE — Progress Notes (Signed)
Patient has been called and DOB has been verified. Patient has been screened and transferred to PCP to start phone visit.     

## 2018-11-10 ENCOUNTER — Telehealth: Payer: Self-pay

## 2018-11-10 NOTE — Telephone Encounter (Signed)
Call placed to [facility], spoke to [place] who stated that they were not able to process the request for additional hours. This CM explained that the request was not for additional hours but to re-instate services as she stated that she lost her medicaid at one point and the services were stopped. She now has medicaid and would like to resume services. April [name] stated that her services should not have stopped the hours that she was approved for are still in place.  The patient can call [facility] # [phone] to discuss with them.  April [name] also noted that the patient is due for her annual assessment by [facility] and can call to schedule that # [phone]. April [name] could not disclose how many hours the patient receives but noted that she does not receive the maximum hours.    Call placed to the patient and informed her of above noted conversation.  The patient said that she is aware of the annual assessment .  She requested that the phone numbers for both agencies be text  to her and they were text as requested

## 2018-11-11 MED ORDER — DUPIXENT 300 MG/2ML ~~LOC~~ SOSY: 300.0000 mg | PREFILLED_SYRINGE | SUBCUTANEOUS | 11 refills | Status: DC

## 2018-11-11 MED ORDER — DUPIXENT 300 MG/2ML ~~LOC~~ SOSY: 600.0000 mg | PREFILLED_SYRINGE | Freq: Once | SUBCUTANEOUS | 0 refills | Status: DC

## 2018-11-11 NOTE — Telephone Encounter (Signed)
Received a fax from [employer]. Pt's Dupixent PA has been approved 11/03/2018 - 05/01/2019.  There isn't any information on which speciality pharmacy the pt's medication should come from. Called [employer] to find out this information. The pt's speciality pharmacy is [facility]. Rx for Dupixent has been sent to [facility], I will contact them later this week to set up shipment.

## 2018-11-12 NOTE — Telephone Encounter (Signed)
Called [facility] to set up shipment. They did not have an account for the pt, this had to be created. I spoke with a pharmacist and had to give a verbal prescription for Dupixent to set up shipment. At the time of my call I could not set up shipment as they have to run the prescription through their system and the pt's insurance. Will call back tomorrow to see if it's possible to set up shipment at that time.

## 2018-11-13 NOTE — Telephone Encounter (Signed)
Called [facility] to follow up on this shipment set up. Spoke with Kennyth Lose. States that the pharmacy is still working on setting up the pt's account and processing the prescription. This could take up to 2-3 business days.

## 2018-11-18 ENCOUNTER — Other Ambulatory Visit: Payer: Self-pay | Admitting: Family Medicine

## 2018-11-19 ENCOUNTER — Telehealth: Payer: Self-pay

## 2018-11-19 MED ORDER — EPINEPHRINE 0.3 MG/0.3ML IJ SOAJ: 0.3000 mg | INTRAMUSCULAR | 1 refills | Status: DC | PRN

## 2018-11-19 NOTE — Telephone Encounter (Signed)
Dupixent Shipment Received: 300mg  #2 prefilled syringe Medication arrival date: 11/19/18 Lot #: [id number] Exp date: 02/2021 Received by: Suzi Roots

## 2018-11-19 NOTE — Telephone Encounter (Signed)
Contacted patient by phone to set up first [place] injection.  Patient anxious to get an appointment as soon as possible.  Reminded patient of need for epi-pen in order to come in for first visit and prepare to stay in the office 2 hours after the injection. Patient does not have epi-pen to begin dupixent.  Will route to provider to approve epipen to [facility] on [address].   Called spoke with patient to schedule first Dupixent injection.   Patient is scheduled for 11/24/18 at 2:00 pm Per 11/19/18 phone note, patient's medication has already been received. Does the patient have the Epipen from the pharmacy? No If no, route message to provider/nurse for Rx. - order placed  Nothing further needed at this time; will sign off.  EpiPen order for Dupixent injection routed to Dr. Annamaria Boots

## 2018-11-19 NOTE — Telephone Encounter (Signed)
Epipen was ordered- she needs to pick it up at [facility]

## 2018-11-19 NOTE — Telephone Encounter (Signed)
Patient aware from previous call to pick up Epipen and must have with her for injection appointment next week.  Nothing further needed.

## 2018-11-22 NOTE — Assessment & Plan Note (Signed)
She hasn't been able to stay off prednisone, but seems motivated by dislike of prednisone. Plan- IgE and Eos, off prednisone if possible, to requalify for another trial of Xolair.

## 2018-11-22 NOTE — Assessment & Plan Note (Signed)
She expresses intent to be compliant Plan- update sleep study to requalify

## 2018-11-24 ENCOUNTER — Other Ambulatory Visit: Payer: Self-pay

## 2018-11-24 ENCOUNTER — Ambulatory Visit (INDEPENDENT_AMBULATORY_CARE_PROVIDER_SITE_OTHER): Payer: Medicare Other

## 2018-11-24 DIAGNOSIS — J4550 Severe persistent asthma, uncomplicated: Secondary | ICD-10-CM | POA: Diagnosis not present

## 2018-11-24 MED ORDER — DUPILUMAB 300 MG/2ML ~~LOC~~ SOSY
600.0000 mg | PREFILLED_SYRINGE | Freq: Once | SUBCUTANEOUS | Status: AC
  Administered 2018-11-24: 600 mg via SUBCUTANEOUS

## 2018-11-24 NOTE — Progress Notes (Signed)
Patient presented to the office today for first-time Dupixent injection.  Primary Pulmonologist: Baird Lyons MD Medication name: Dupixent Strength: 600mg   Site(s): L and R Arm  Epi pen/Auvi-Q visible during appointment: Yes  Time of injection: 1400  Patient evaluated every 15-20 minutes per protocol x2 hours.  1st check: 1420 Evaluation: No symptoms  2nd check: 1440 Evaluation: No symptoms  3rd check: 1500 Evaluation: No symptoms  4th check: 1520 Evaluation: No symptoms  5th check: 1540 Evaluation: No symptoms  6th check: 1600 Evaluation: No symptoms

## 2018-12-01 ENCOUNTER — Telehealth: Payer: Self-pay | Admitting: Internal Medicine

## 2018-12-01 NOTE — Telephone Encounter (Signed)
Dupixent Order: 300mg  #2 prefilled syringe Ordered Date: 12/01/18  Expected date of arrival: 12/04/18 Ordered by: Parke Poisson, [place]: [facility]

## 2018-12-04 NOTE — Telephone Encounter (Signed)
Dupixent Shipment Received: 300mg  #2 prefilled syringe Medication arrival date: 12/04/18  Lot #: [id number] Exp date: 02/27/2021 Received by: Parke Poisson, CMA

## 2018-12-08 ENCOUNTER — Other Ambulatory Visit: Payer: Self-pay | Admitting: Family Medicine

## 2018-12-08 DIAGNOSIS — J4541 Moderate persistent asthma with (acute) exacerbation: Secondary | ICD-10-CM

## 2018-12-08 DIAGNOSIS — K219 Gastro-esophageal reflux disease without esophagitis: Secondary | ICD-10-CM

## 2018-12-09 ENCOUNTER — Telehealth: Payer: Self-pay | Admitting: Internal Medicine

## 2018-12-09 ENCOUNTER — Other Ambulatory Visit: Payer: Self-pay

## 2018-12-09 ENCOUNTER — Ambulatory Visit (INDEPENDENT_AMBULATORY_CARE_PROVIDER_SITE_OTHER): Payer: Medicare Other

## 2018-12-09 DIAGNOSIS — J4550 Severe persistent asthma, uncomplicated: Secondary | ICD-10-CM | POA: Diagnosis not present

## 2018-12-09 MED ORDER — DUPILUMAB 300 MG/2ML ~~LOC~~ SOSY
300.0000 mg | PREFILLED_SYRINGE | Freq: Once | SUBCUTANEOUS | Status: AC
  Administered 2018-12-09: 300 mg via SUBCUTANEOUS

## 2018-12-09 NOTE — Telephone Encounter (Signed)
Spoke with pt, she would like to pick up the HST equipment at the same time as her injection on 12/23/2018. Her injection is scheduled for 3p. PCC's please advise.

## 2018-12-09 NOTE — Progress Notes (Addendum)
Have you been hospitalized within the last 10 days?  No Do you have a fever?  No Do you have a cough?  No Do you have a headache or sore throat? No Do you have your Epi Pen visible and is it within date?  Yes   Patient received injection in right arm at 1540.  Patient sit in chair in infusion clinic area.  1545- Patient denied any soreness, sob, itching, or other adverse effects.  1550- Patient denied any soreness at sight, sob, or adverse effects.  1600-Patient denied soreness at injection site, sob, itching, or other adverse effects.

## 2018-12-10 NOTE — Telephone Encounter (Signed)
Spoke to pt she is set up for 2:30pm on 12/23/18 before her 3pm injection she has no showed for the HST 3 times I made her aware she will have to have the machine back to [place] by 12p the next day in order not to be charged extra is voiced understanding about this [place]ellen [place]

## 2018-12-18 ENCOUNTER — Other Ambulatory Visit: Payer: Self-pay | Admitting: Family Medicine

## 2018-12-18 DIAGNOSIS — G629 Polyneuropathy, unspecified: Secondary | ICD-10-CM

## 2018-12-22 ENCOUNTER — Telehealth: Payer: Self-pay | Admitting: Internal Medicine

## 2018-12-22 MED ORDER — DULERA 200-5 MCG/ACT IN AERO: 2.0000 | INHALATION_SPRAY | Freq: Two times a day (BID) | RESPIRATORY_TRACT | 1 refills | Status: DC

## 2018-12-22 NOTE — Telephone Encounter (Signed)
Pt is requesting refill on her April [name] to be sent to [facility] on [address]

## 2018-12-23 ENCOUNTER — Ambulatory Visit: Payer: BLUE CROSS/BLUE SHIELD

## 2018-12-23 ENCOUNTER — Telehealth: Payer: Self-pay | Admitting: Internal Medicine

## 2018-12-23 NOTE — Telephone Encounter (Signed)
April [name] has been rescheduled on 8/278/20 @ 2:30pm right before getting her shot

## 2018-12-24 ENCOUNTER — Other Ambulatory Visit: Payer: Self-pay

## 2018-12-24 ENCOUNTER — Ambulatory Visit (INDEPENDENT_AMBULATORY_CARE_PROVIDER_SITE_OTHER): Payer: Medicare Other

## 2018-12-24 DIAGNOSIS — G4733 Obstructive sleep apnea (adult) (pediatric): Secondary | ICD-10-CM

## 2018-12-24 DIAGNOSIS — J4550 Severe persistent asthma, uncomplicated: Secondary | ICD-10-CM | POA: Diagnosis not present

## 2018-12-24 MED ORDER — DUPILUMAB 300 MG/2ML ~~LOC~~ SOSY
300.0000 mg | PREFILLED_SYRINGE | Freq: Once | SUBCUTANEOUS | Status: AC
  Administered 2018-12-24: 300 mg via SUBCUTANEOUS

## 2018-12-24 NOTE — Progress Notes (Signed)
Have you been hospitalized within the last 10 days?  No Do you have a fever?  No Do you have a cough?  No Do you have a headache or sore throat? No Do you have your Epi Pen visible and is it within date?  Yes 

## 2018-12-26 DIAGNOSIS — G4733 Obstructive sleep apnea (adult) (pediatric): Secondary | ICD-10-CM | POA: Diagnosis not present

## 2018-12-28 ENCOUNTER — Telehealth: Payer: Self-pay | Admitting: Internal Medicine

## 2018-12-28 NOTE — Telephone Encounter (Signed)
Dupixent Order: 300mg  #2 prefilled syringe Ordered Date: 12/28/18 Expected date of arrival: 12/29/18 Ordered by: Tonna Corner [facility]: [facility]

## 2018-12-29 NOTE — Telephone Encounter (Signed)
Dupixent Shipment Received: 300mg  #2 prefilled syringe Medication arrival date: 12/29/18 Lot #: [id number] Exp date: 03/29/2021 Received by: Tonna Corner

## 2019-01-06 ENCOUNTER — Telehealth: Payer: Self-pay | Admitting: Internal Medicine

## 2019-01-06 NOTE — Telephone Encounter (Signed)
Spoke with pt. She would like to come in on Friday, 01/08/2019 to get her Xolair injections. Advised her that I would fit her in that morning. Pt will be coming around 0945. Nothing further was needed.

## 2019-01-07 ENCOUNTER — Ambulatory Visit: Payer: Medicare Other

## 2019-01-08 ENCOUNTER — Other Ambulatory Visit: Payer: Self-pay

## 2019-01-08 ENCOUNTER — Ambulatory Visit (INDEPENDENT_AMBULATORY_CARE_PROVIDER_SITE_OTHER): Payer: Medicare Other

## 2019-01-08 DIAGNOSIS — J4550 Severe persistent asthma, uncomplicated: Secondary | ICD-10-CM | POA: Diagnosis not present

## 2019-01-08 DIAGNOSIS — G4733 Obstructive sleep apnea (adult) (pediatric): Secondary | ICD-10-CM | POA: Diagnosis not present

## 2019-01-08 MED ORDER — DUPILUMAB 300 MG/2ML ~~LOC~~ SOSY
300.0000 mg | PREFILLED_SYRINGE | Freq: Once | SUBCUTANEOUS | Status: AC
  Administered 2019-01-08: 300 mg via SUBCUTANEOUS

## 2019-01-08 NOTE — Progress Notes (Signed)
Have you been hospitalized within the last 10 days?  No Do you have a fever?  No Do you have a cough?  No Do you have a headache or sore throat? No Do you have your Epi Pen visible and is it within date?  Yes 

## 2019-01-15 ENCOUNTER — Other Ambulatory Visit: Payer: Self-pay | Admitting: Family Medicine

## 2019-01-15 ENCOUNTER — Telehealth: Payer: Self-pay | Admitting: Pharmacist

## 2019-01-15 NOTE — Telephone Encounter (Signed)
Spoke with pt's pharmacy. Confirmed that Ventolin is on patient's insurance formulary; pharmacy reports patient has been filling Ventolin with no problems with paid claims.

## 2019-01-22 ENCOUNTER — Ambulatory Visit: Payer: Medicare Other

## 2019-01-25 ENCOUNTER — Ambulatory Visit: Payer: Medicare Other

## 2019-01-26 ENCOUNTER — Ambulatory Visit (INDEPENDENT_AMBULATORY_CARE_PROVIDER_SITE_OTHER): Payer: Medicare Other

## 2019-01-26 ENCOUNTER — Other Ambulatory Visit: Payer: Self-pay | Admitting: Family Medicine

## 2019-01-26 ENCOUNTER — Other Ambulatory Visit: Payer: Self-pay

## 2019-01-26 ENCOUNTER — Telehealth: Payer: Self-pay

## 2019-01-26 DIAGNOSIS — J4550 Severe persistent asthma, uncomplicated: Secondary | ICD-10-CM | POA: Diagnosis not present

## 2019-01-26 MED ORDER — POTASSIUM CHLORIDE CRYS ER 20 MEQ PO TBCR: 20.0000 meq | EXTENDED_RELEASE_TABLET | Freq: Every day | ORAL | 3 refills | Status: DC

## 2019-01-26 MED ORDER — DUPILUMAB 300 MG/2ML ~~LOC~~ SOSY
300.0000 mg | PREFILLED_SYRINGE | Freq: Once | SUBCUTANEOUS | Status: AC
  Administered 2019-01-26: 300 mg via SUBCUTANEOUS

## 2019-01-26 MED ORDER — ZOLPIDEM TARTRATE 5 MG PO TABS: ORAL_TABLET | ORAL | 2 refills | Status: DC

## 2019-01-26 NOTE — Telephone Encounter (Signed)
Refilled

## 2019-01-26 NOTE — Progress Notes (Signed)
All questions were answered by the patient before medication was administered. Have you been hospitalized in the last 10 days? No Do you have a fever? No Do you have a cough? No Do you have a headache or sore throat? No  

## 2019-01-26 NOTE — Telephone Encounter (Signed)
Call placed to patient and informed her that the medications have been refilled.

## 2019-01-26 NOTE — Telephone Encounter (Signed)
Call received from the patient requesting refills for potassium and ambien.    She also reported that she has been doing " really well" since she has been receiving the dupixent every 2 weeks and is glad to be able to stay out of the hospital.

## 2019-02-01 ENCOUNTER — Telehealth: Payer: Self-pay | Admitting: Internal Medicine

## 2019-02-01 NOTE — Telephone Encounter (Signed)
Dupixent Order: 300mg  #2 prefilled syringe Ordered Date: 02/01/19 Expected date of arrival: 02/04/19 Ordered by: Lattie Haw, LPN Specialty Pharmacy: [facility]

## 2019-02-03 NOTE — Telephone Encounter (Signed)
[facility] calling asking a/b a PA on this pt stating that the other one has expired they can be reached @ [phone].April [name]

## 2019-02-04 NOTE — Telephone Encounter (Signed)
[facility]. States that her insurance is needing a PA. Advised them that we have an approval on file with [employer] until 05/01/2019. I was told that this is a Medicare issue and we will need to contact them. I wasn't given a number as to where to contact them at. Will follow up.

## 2019-02-09 ENCOUNTER — Ambulatory Visit: Payer: Medicare Other

## 2019-02-10 NOTE — Telephone Encounter (Signed)
Called [facility] to follow up on PA and contact number if needed. PA has been denied.  Per Gregary Signs, Medicare will only cover non formulary medication for trial period and Dupixent PA has now expired per Medicare. Gregary Signs gave number to follow up with PA and medication, [phone]. Contacted [phone], [place].  Representative stated PA was required for Dupixent, because it is a non formulary.  PA request fax is too be sent to office main fax number this afternoon. Will follow up.

## 2019-02-11 ENCOUNTER — Ambulatory Visit (INDEPENDENT_AMBULATORY_CARE_PROVIDER_SITE_OTHER): Payer: Medicare Other | Admitting: Internal Medicine

## 2019-02-11 ENCOUNTER — Other Ambulatory Visit: Payer: Self-pay

## 2019-02-11 ENCOUNTER — Ambulatory Visit: Payer: Medicare Other

## 2019-02-11 ENCOUNTER — Encounter: Payer: Self-pay | Admitting: Internal Medicine

## 2019-02-11 ENCOUNTER — Telehealth: Payer: Self-pay | Admitting: Internal Medicine

## 2019-02-11 VITALS — BP 114/70 | HR 96 | Temp 97.3°F | Ht 66.0 in | Wt 342.8 lb

## 2019-02-11 DIAGNOSIS — J4541 Moderate persistent asthma with (acute) exacerbation: Secondary | ICD-10-CM

## 2019-02-11 DIAGNOSIS — G4733 Obstructive sleep apnea (adult) (pediatric): Secondary | ICD-10-CM

## 2019-02-11 DIAGNOSIS — Z6841 Body Mass Index (BMI) 40.0 and over, adult: Secondary | ICD-10-CM

## 2019-02-11 DIAGNOSIS — J449 Chronic obstructive pulmonary disease, unspecified: Secondary | ICD-10-CM

## 2019-02-11 MED ORDER — ALBUTEROL SULFATE HFA 108 (90 BASE) MCG/ACT IN AERS: 2.0000 | INHALATION_SPRAY | RESPIRATORY_TRACT | 3 refills | Status: DC | PRN

## 2019-02-11 MED ORDER — MONTELUKAST SODIUM 10 MG PO TABS: 10.0000 mg | ORAL_TABLET | Freq: Every day | ORAL | 3 refills | Status: DC

## 2019-02-11 NOTE — Patient Instructions (Signed)
Refill scripts sent  Ok to continue [place]. If insurance won't cover it we will look to try a different one like it.  Order- referral to Dr Ron Parker, orthodontist   Consider oral appliance for OSA  Referral Phone number for Bariatric weight loss program  Please call if we can help

## 2019-02-11 NOTE — Telephone Encounter (Signed)
Dupixent PA form signed by CY and returned to injection.

## 2019-02-11 NOTE — Assessment & Plan Note (Signed)
Cutrently stable. She feels Dupixent very effective at stabilizing airways. When she missed Dupixent she had another hospital visit for asthma.  If insurance wont support Dupixent we will look at options.

## 2019-02-11 NOTE — Telephone Encounter (Signed)
Duplicate encounter, see previous note

## 2019-02-11 NOTE — Telephone Encounter (Signed)
Returned call from pharmacy.  Was told that the dupixent PA was faxed over this morning at 10:20.   Routing to injection team to follow up on this.

## 2019-02-11 NOTE — Telephone Encounter (Signed)
[place] PA fax received 02/10/19, and hand delivered to [place], [place].  Will be returned to injection after Dr. Annamaria Boots signs. Will follow up.

## 2019-02-11 NOTE — Assessment & Plan Note (Addendum)
Morbid obesity. She is interested in surgery when asthma is stable enough. Plan- Bariatric freferral

## 2019-02-11 NOTE — Progress Notes (Signed)
Patient ID: April [name], female    DOB: 1970/07/04, 48 y.o.   MRN: [id number]   HPI F former smoker followed for chronic severe asthma/ Xolair complicated by non-compliance,  OSA/ CPAP,  morbid obesity, HBP, depression, GERD, AFib/ Eliquis  NPSG 03/31/07- AHI 5.8/ hr, weight 330 lbs. She canceled planned more recent study. Office spirometry 10/24/14- severe obstructive airways disease. FVC 2.10/66%, FEV1 1.25/47%, FEV1/FVC 0.59, FEF 25-75 percent 0.64/19% Unattended HST 07/06/16- AHI 11/hour, desaturation to 74%. Office Spirometry 07/29/2016-moderately severe obstructive airways disease. FVC 2.21/68%, FEV1 1.53/57%, ratio 0.69, FEF 25-75% 0.98/34% Prolonged hosp [facility] 4/19- tracheostomy. Dupixent enrolled 10/22/2018 HST 12/26/2018- AHI 9.7/ hr, desaturation to 9.7/ hr, desaturation to 69% -------------------------------------------------------   10/08/18 20 televisit History of Present Illness: 48 year old female former smoker followed for chronic severe asthma/Xolair, Tracheostomy at [facility] [id number],  complicated by noncompliance, OSA/CPAP, morbid obesity, HBP, depression, GERD CPAP AutoPap 5-20/Advanced- not using. C/O insomnia and agrees to update NPSG in future. Frequent WASO. Asks Ambien 10 mg- otc no help, failed Trazodone. Try CBD oil. Presented for ov last week with exacerb asthma, eval by NP and sent to ED. Nasal [place] test was neg Was assessed as improved at ED, given neb Rx and sent home on pred x 5 days.  -----pt states breathing is much better since ED visit,  Still tapering prednisone and feels she can't stay off it. Asking retry Xolair, stating she will be compliant this time because motivated to be off prednisone. Has Dulera and neb. CXR  09/04/2018- No active disease Observations/Objective: Assessment and Plan: Asthma- steroid dependent. Motivated to retry Xolair. P;an- IgE, Eos (off prednisone if possible). OSA- noncompliant w CPAP in past- Plan- update sleep study Follow Up  Instructions:   02/11/2019- 48 year old female former smoker followed for chronic severe asthma/Xolair, Tracheostomy at [facility] [id number],  complicated by noncompliance, OSA/CPAP, morbid obesity, HBP, depression, GERD Dupixent enrolled 10/22/2018- PA coverage issue- Injections begun. Her insurance only covers 5 injections per set before another PA required.  Hosp- ED 10/26/2018- severe asthma Off prednisone. Using [facility] and with Dupixent she uses rescue inhaler 2-3x/ week.  HST 12/26/2018- AHI 9.7/ hr, desaturation to 9.7/ hr, desaturation to 69% She felt claustrophobic in past trying full-face CPAP mask. For her very mild OSSA, we discussed looking into oral appliance before trying CPAP/ nasal pillows. Ambien 5 mg doesn't last- she wakes after 2 hours and can't sustain sleep. I asked her to try adding an otc like Zzquil before we look at other options. CXR 10/26/2018-  IMPRESSION: No active disease.   Review of Systems-See HPI  + = positive Constitutional:   + weight loss, night sweats, fevers, chills, +fatigue, lassitude. HEENT:   No-  headaches, difficulty swallowing, tooth/dental problems, sore throat,    sneezing, itching, ear ache, +nasal congestion, post nasal drip,  CV:  No-   chest pain, orthopnea, PND, swelling in lower extremities, anasarca, dizziness, palpitations Resp: +shortness of breath with exertion or at rest.              productive cough,  + non-productive cough,  No- coughing up of blood.              No-   change in color of mucus. +wheezing.   Skin: No-   rash or lesions. GI:  No-   heartburn, indigestion, abdominal pain, nausea, vomiting,  GU: MS:  No-   joint pain or swelling.   Neuro-     nothing unusual Psych:  No-  change in mood or affect. No depression or anxiety.  No memory loss.    Objective:   Physical Exam   + wheelchair General- Alert, Oriented, Affect-appropriate, Distress- none acute, + morbid  obesity,  Skin- rash-none, lesions- none, excoriation-  none Lymphadenopathy- none Head- atraumatic            Eyes- Gross vision intact, PERRLA, conjunctivae clear secretions            Ears- Hearing, canals-normal            Nose- Clear, no-Septal dev, mucus, polyps, erosion, perforation. + sniffing            Throat- Mallampati III , mucosa clear , drainage- none, tonsils- atrophic,                           + Missing  teeth, +upper denture plate is out                  Neck- flexible , trachea+ stoma scar, no stridor , thyroid nl, carotid no bruit Chest - symmetrical excursion , unlabored           Heart/CV- RRR , no murmur , no gallop  , no rub, nl s1 s2                           - JVD- none , edema- none, stasis changes- none, varices- none           Lung- +Diminished/ clear now, unlabored.  Wheeze -None, cough-none,                                               dullness-none,  rub- none           Chest wall-  Abd-  Br/ Gen/ Rectal- Not done, not indicated Extrem- cyanosis- none, clubbing, none, atrophy- none, strength- nl Neuro- grossly intact to observation

## 2019-02-11 NOTE — Assessment & Plan Note (Signed)
Dental status may not support oral appliance. She wants to talk to orthodontist about options.If unab le to use oral appliance, we can go back to CPAP with nasal pillows.

## 2019-02-12 ENCOUNTER — Telehealth: Payer: Self-pay | Admitting: Internal Medicine

## 2019-02-12 NOTE — Telephone Encounter (Signed)
Patient is aware Dupixent PA has been completed and we are waiting on approval. Patient will be contacted with updates. Patient stated understanding.

## 2019-02-12 NOTE — Telephone Encounter (Signed)
Dupixent PA completed form, OV notes, and labs faxed to [employer], fax#[phone]. Conformation received.  [facility] phone # [phone], option 3. Patient is aware we are waiting for approval. Will follow up.

## 2019-02-14 ENCOUNTER — Emergency Department (HOSPITAL_COMMUNITY): Payer: Medicare Other

## 2019-02-14 ENCOUNTER — Encounter (HOSPITAL_COMMUNITY): Payer: Self-pay | Admitting: Emergency Medicine

## 2019-02-14 ENCOUNTER — Other Ambulatory Visit: Payer: Self-pay

## 2019-02-14 ENCOUNTER — Observation Stay (HOSPITAL_COMMUNITY)
Admission: EM | Admit: 2019-02-14 | Discharge: 2019-02-14 | Disposition: A | Discharge status: Home / Self Care | Payer: Medicare Other | Attending: Internal Medicine | Admitting: Internal Medicine

## 2019-02-14 DIAGNOSIS — Z888 Allergy status to other drugs, medicaments and biological substances status: Secondary | ICD-10-CM | POA: Diagnosis not present

## 2019-02-14 DIAGNOSIS — J4551 Severe persistent asthma with (acute) exacerbation: Secondary | ICD-10-CM

## 2019-02-14 DIAGNOSIS — Z7951 Long term (current) use of inhaled steroids: Secondary | ICD-10-CM | POA: Diagnosis not present

## 2019-02-14 DIAGNOSIS — Z6841 Body Mass Index (BMI) 40.0 and over, adult: Secondary | ICD-10-CM | POA: Diagnosis not present

## 2019-02-14 DIAGNOSIS — G4733 Obstructive sleep apnea (adult) (pediatric): Secondary | ICD-10-CM | POA: Insufficient documentation

## 2019-02-14 DIAGNOSIS — K219 Gastro-esophageal reflux disease without esophagitis: Secondary | ICD-10-CM

## 2019-02-14 DIAGNOSIS — F329 Major depressive disorder, single episode, unspecified: Secondary | ICD-10-CM | POA: Diagnosis not present

## 2019-02-14 DIAGNOSIS — M170 Bilateral primary osteoarthritis of knee: Secondary | ICD-10-CM | POA: Diagnosis not present

## 2019-02-14 DIAGNOSIS — Z79899 Other long term (current) drug therapy: Secondary | ICD-10-CM | POA: Insufficient documentation

## 2019-02-14 DIAGNOSIS — J45901 Unspecified asthma with (acute) exacerbation: Secondary | ICD-10-CM | POA: Diagnosis present

## 2019-02-14 DIAGNOSIS — J4541 Moderate persistent asthma with (acute) exacerbation: Secondary | ICD-10-CM

## 2019-02-14 DIAGNOSIS — Z87891 Personal history of nicotine dependence: Secondary | ICD-10-CM | POA: Diagnosis not present

## 2019-02-14 DIAGNOSIS — J449 Chronic obstructive pulmonary disease, unspecified: Secondary | ICD-10-CM | POA: Insufficient documentation

## 2019-02-14 DIAGNOSIS — I5032 Chronic diastolic (congestive) heart failure: Secondary | ICD-10-CM | POA: Insufficient documentation

## 2019-02-14 DIAGNOSIS — Z8249 Family history of ischemic heart disease and other diseases of the circulatory system: Secondary | ICD-10-CM | POA: Insufficient documentation

## 2019-02-14 DIAGNOSIS — G4700 Insomnia, unspecified: Secondary | ICD-10-CM | POA: Diagnosis not present

## 2019-02-14 DIAGNOSIS — Z20828 Contact with and (suspected) exposure to other viral communicable diseases: Secondary | ICD-10-CM | POA: Diagnosis not present

## 2019-02-14 DIAGNOSIS — Z7901 Long term (current) use of anticoagulants: Secondary | ICD-10-CM | POA: Diagnosis not present

## 2019-02-14 DIAGNOSIS — I110 Hypertensive heart disease with heart failure: Secondary | ICD-10-CM | POA: Insufficient documentation

## 2019-02-14 DIAGNOSIS — I10 Essential (primary) hypertension: Secondary | ICD-10-CM | POA: Diagnosis present

## 2019-02-14 DIAGNOSIS — J441 Chronic obstructive pulmonary disease with (acute) exacerbation: Secondary | ICD-10-CM | POA: Diagnosis present

## 2019-02-14 DIAGNOSIS — F419 Anxiety disorder, unspecified: Secondary | ICD-10-CM | POA: Diagnosis not present

## 2019-02-14 DIAGNOSIS — I480 Paroxysmal atrial fibrillation: Secondary | ICD-10-CM | POA: Insufficient documentation

## 2019-02-14 DIAGNOSIS — R232 Flushing: Secondary | ICD-10-CM | POA: Insufficient documentation

## 2019-02-14 LAB — CBC WITH DIFFERENTIAL/PLATELET
Abs Immature Granulocytes: 0.46 10*3/uL — ABNORMAL HIGH (ref 0.00–0.07)
Basophils Absolute: 0.1 10*3/uL (ref 0.0–0.1)
Basophils Relative: 0 %
Eosinophils Absolute: 0 10*3/uL (ref 0.0–0.5)
Eosinophils Relative: 0 %
HCT: 41.4 % (ref 36.0–46.0)
Hemoglobin: 13 g/dL (ref 12.0–15.0)
Immature Granulocytes: 2 %
Lymphocytes Relative: 12 %
Lymphs Abs: 2.6 10*3/uL (ref 0.7–4.0)
MCH: 27.9 pg (ref 26.0–34.0)
MCHC: 31.4 g/dL (ref 30.0–36.0)
MCV: 88.8 fL (ref 80.0–100.0)
Monocytes Absolute: 1.4 10*3/uL — ABNORMAL HIGH (ref 0.1–1.0)
Monocytes Relative: 7 %
Neutro Abs: 16.9 10*3/uL — ABNORMAL HIGH (ref 1.7–7.7)
Neutrophils Relative %: 79 %
Platelets: 484 10*3/uL — ABNORMAL HIGH (ref 150–400)
RBC: 4.66 MIL/uL (ref 3.87–5.11)
RDW: 17 % — ABNORMAL HIGH (ref 11.5–15.5)
WBC: 21.5 10*3/uL — ABNORMAL HIGH (ref 4.0–10.5)
nRBC: 0 % (ref 0.0–0.2)

## 2019-02-14 LAB — BASIC METABOLIC PANEL
Anion gap: 12 (ref 5–15)
BUN: 11 mg/dL (ref 6–20)
CO2: 21 mmol/L — ABNORMAL LOW (ref 22–32)
Calcium: 9 mg/dL (ref 8.9–10.3)
Chloride: 103 mmol/L (ref 98–111)
Creatinine, Ser: 0.81 mg/dL (ref 0.44–1.00)
GFR calc Af Amer: >60 mL/min (ref >60)
GFR calc non Af Amer: >60 mL/min (ref >60)
Glucose, Bld: 161 mg/dL — ABNORMAL HIGH (ref 70–99)
Potassium: 4 mmol/L (ref 3.5–5.1)
Sodium: 136 mmol/L (ref 135–145)

## 2019-02-14 LAB — SARS CORONAVIRUS 2 (TAT 6-24 HRS): SARS Coronavirus 2: NEGATIVE

## 2019-02-14 MED ORDER — IPRATROPIUM BROMIDE 0.02 % IN SOLN
0.5000 mg | Freq: Once | RESPIRATORY_TRACT | Status: AC
  Administered 2019-02-14: 0.5 mg via RESPIRATORY_TRACT
  Filled 2019-02-14: qty 2.5

## 2019-02-14 MED ORDER — POTASSIUM CHLORIDE CRYS ER 20 MEQ PO TBCR: 20.0000 meq | EXTENDED_RELEASE_TABLET | Freq: Every day | ORAL | Status: DC

## 2019-02-14 MED ORDER — APIXABAN 5 MG PO TABS: 5.0000 mg | ORAL_TABLET | Freq: Two times a day (BID) | ORAL | Status: DC

## 2019-02-14 MED ORDER — ALBUTEROL (5 MG/ML) CONTINUOUS INHALATION SOLN
15.0000 mg/h | INHALATION_SOLUTION | Freq: Once | RESPIRATORY_TRACT | Status: AC
  Administered 2019-02-14: 15 mg/h via RESPIRATORY_TRACT
  Filled 2019-02-14 (×2): qty 20

## 2019-02-14 MED ORDER — ALBUTEROL SULFATE (2.5 MG/3ML) 0.083% IN NEBU
5.0000 mg | INHALATION_SOLUTION | Freq: Once | RESPIRATORY_TRACT | Status: AC
  Administered 2019-02-14: 5 mg via RESPIRATORY_TRACT
  Filled 2019-02-14: qty 6

## 2019-02-14 MED ORDER — METOPROLOL TARTRATE 25 MG PO TABS: 50.0000 mg | ORAL_TABLET | Freq: Two times a day (BID) | ORAL | Status: DC

## 2019-02-14 MED ORDER — ENOXAPARIN SODIUM 40 MG/0.4ML ~~LOC~~ SOLN
40.0000 mg | SUBCUTANEOUS | Status: DC
  Filled 2019-02-14: qty 0.4

## 2019-02-14 MED ORDER — IPRATROPIUM BROMIDE 0.02 % IN SOLN
1.0000 mg | Freq: Once | RESPIRATORY_TRACT | Status: AC
  Administered 2019-02-14: 1 mg via RESPIRATORY_TRACT
  Filled 2019-02-14 (×2): qty 5

## 2019-02-14 MED ORDER — GABAPENTIN 300 MG PO CAPS: 900.0000 mg | ORAL_CAPSULE | Freq: Two times a day (BID) | ORAL | Status: DC

## 2019-02-14 MED ORDER — PREDNISONE 20 MG PO TABS: ORAL_TABLET | ORAL | 0 refills | Status: DC

## 2019-02-14 MED ORDER — ALBUTEROL SULFATE (2.5 MG/3ML) 0.083% IN NEBU: 2.5000 mg | INHALATION_SOLUTION | RESPIRATORY_TRACT | Status: DC | PRN

## 2019-02-14 MED ORDER — FUROSEMIDE 40 MG PO TABS
40.0000 mg | ORAL_TABLET | Freq: Every day | ORAL | Status: DC
  Filled 2019-02-14: qty 1

## 2019-02-14 MED ORDER — ONDANSETRON HCL 4 MG/2ML IJ SOLN: 4.0000 mg | Freq: Four times a day (QID) | INTRAMUSCULAR | Status: DC | PRN

## 2019-02-14 MED ORDER — ONDANSETRON HCL 4 MG PO TABS: 4.0000 mg | ORAL_TABLET | Freq: Four times a day (QID) | ORAL | Status: DC | PRN

## 2019-02-14 MED ORDER — MONTELUKAST SODIUM 10 MG PO TABS: 10.0000 mg | ORAL_TABLET | Freq: Every day | ORAL | Status: DC

## 2019-02-14 MED ORDER — HYDRALAZINE HCL 10 MG PO TABS
10.0000 mg | ORAL_TABLET | Freq: Three times a day (TID) | ORAL | Status: DC
  Filled 2019-02-14 (×2): qty 1

## 2019-02-14 MED ORDER — CLONIDINE HCL 0.1 MG PO TABS: 0.1000 mg | ORAL_TABLET | Freq: Every evening | ORAL | 1 refills | Status: DC | PRN

## 2019-02-14 MED ORDER — OMEPRAZOLE 20 MG PO CPDR: 20.0000 mg | DELAYED_RELEASE_CAPSULE | Freq: Two times a day (BID) | ORAL | 2 refills | Status: DC

## 2019-02-14 MED ORDER — ALBUTEROL SULFATE (2.5 MG/3ML) 0.083% IN NEBU
5.0000 mg | INHALATION_SOLUTION | Freq: Once | RESPIRATORY_TRACT | Status: DC
  Filled 2019-02-14: qty 6

## 2019-02-14 MED ORDER — ALBUTEROL SULFATE HFA 108 (90 BASE) MCG/ACT IN AERS: 2.0000 | INHALATION_SPRAY | RESPIRATORY_TRACT | Status: DC | PRN

## 2019-02-14 MED ORDER — METHYLPREDNISOLONE SODIUM SUCC 125 MG IJ SOLR
60.0000 mg | Freq: Two times a day (BID) | INTRAMUSCULAR | Status: DC
  Administered 2019-02-14: 60 mg via INTRAVENOUS
  Filled 2019-02-14: qty 2

## 2019-02-14 MED ORDER — ACETAMINOPHEN 325 MG PO TABS
650.0000 mg | ORAL_TABLET | Freq: Four times a day (QID) | ORAL | Status: DC | PRN
  Administered 2019-02-14: 650 mg via ORAL
  Filled 2019-02-14: qty 2

## 2019-02-14 MED ORDER — IPRATROPIUM BROMIDE 0.02 % IN SOLN: 0.5000 mg | Freq: Four times a day (QID) | RESPIRATORY_TRACT | Status: DC

## 2019-02-14 MED ORDER — NICOTINE 21 MG/24HR TD PT24: 21.0000 mg | MEDICATED_PATCH | Freq: Every day | TRANSDERMAL | 1 refills | Status: DC

## 2019-02-14 MED ORDER — MOMETASONE FURO-FORMOTEROL FUM 200-5 MCG/ACT IN AERO
2.0000 | INHALATION_SPRAY | Freq: Two times a day (BID) | RESPIRATORY_TRACT | Status: DC
  Filled 2019-02-14: qty 8.8

## 2019-02-14 MED ORDER — ZOLPIDEM TARTRATE 5 MG PO TABS: 5.0000 mg | ORAL_TABLET | Freq: Every evening | ORAL | Status: DC | PRN

## 2019-02-14 MED ORDER — ACETAMINOPHEN 650 MG RE SUPP: 650.0000 mg | Freq: Four times a day (QID) | RECTAL | Status: DC | PRN

## 2019-02-14 NOTE — ED Provider Notes (Signed)
TIME SEEN: 1:27 AM  CHIEF COMPLAINT: Asthma exacerbation  HPI: Patient is a 48 year old female with history of asthma, diastolic heart failure, OSA,  hypertension, obesity, A. fib on Eliquis who presents to the emergency department with shortness of breath that started tonight.  Patient has had wheezing.  No fevers, chills, cough, chest pain, lower extremity swelling or pain.  No history of PE or DVT.  This feels like her previous asthma exacerbations.  Denies COVID exposures.  She is admitted to the hospital frequently for the same and has been previously intubated.  She is on multiple medications for her asthma and it appears she has a history of noncompliance.  Is followed by Dr. Annamaria Boots with pulmonology.  Has previously had a tracheostomy in 2019 at [facility].  Patient received 125 mg of IV Solu-Medrol, 2 g of IV magnesium and 0.3 mg of IM epinephrine with EMS.  Room air oxygen saturation 98% with EMS.  ROS: See HPI Constitutional: no fever  Eyes: no drainage  ENT: no runny nose   Cardiovascular:  no chest pain  Resp: no SOB  GI: no vomiting GU: no dysuria Integumentary: no rash  Allergy: no hives  Musculoskeletal: no leg swelling  Neurological: no slurred speech ROS otherwise negative  PAST MEDICAL HISTORY/PAST SURGICAL HISTORY:  Past Medical History:  Diagnosis Date  . Acanthosis nigricans   . Anxiety   . Arthritis    "knees" (04/28/2017)  . Asthma    Followed by Dr. Annamaria Boots (pulmonology); receives every other week omalizumab injections; has frequent exacerbations  . Chronic diastolic CHF (congestive heart failure) ([place]) 01/17/2017  . COPD (chronic obstructive pulmonary disease) ([place])    PFTs in 2002, FEV1/FVC 65, no post bronchodilater test done  . Depression   . GERD (gastroesophageal reflux disease)   . Headache(784.0)    "q couple days" (04/28/2017)  . Helicobacter pylori (H. pylori) infection   . Hypertension, essential   . Insomnia   . Menorrhagia   . Morbid obesity ([place])    . OSA on CPAP    Sleep study 2008 - mild OSA, not enough events to titrate CPAP; wears CPAP now/pt on 04/28/2017  . Pneumonia X 1  . Seasonal allergies   . Shortness of breath   . Tobacco user     MEDICATIONS:  Prior to Admission medications   Medication Sig Start Date End Date Taking? Authorizing Provider  albuterol (PROVENTIL) (2.5 MG/3ML) 0.083% nebulizer solution USE 1 VIAL VIA NEBULIZER EVERY 6 HOURS AS NEEDED FOR WHEEZING OR SHORTNESS OF BREATH 12/08/18   [name], MD  albuterol (VENTOLIN HFA) 108 (90 Base) MCG/ACT inhaler Inhale 2 puffs into the lungs every 4 (four) hours as needed for wheezing or shortness of breath. 02/11/19   Deneise Lever, MD  apixaban (ELIQUIS) 5 MG TABS tablet Take 1 tablet (5 mg total) by mouth 2 (two) times daily. 08/12/18   [name], MD  cloNIDine (CATAPRES) 0.1 MG tablet Take 1 tablet (0.1 mg total) by mouth at bedtime as needed. For hot flashes 11/09/18   [name], MD  DULoxetine (CYMBALTA) 60 MG capsule TAKE 1 CAPSULE(60 MG) BY MOUTH DAILY 12/21/18   [name], MD  dupilumab (DUPIXENT) 300 MG/2ML prefilled syringe Inject 300 mg into the skin every 14 (fourteen) days. 11/11/18   Baird Lyons D, MD  EPINEPHrine 0.3 mg/0.3 mL IJ SOAJ injection Inject 0.3 mLs (0.3 mg total) into the muscle as needed for anaphylaxis. 11/19/18   Deneise Lever, MD  Ferrous  Sulfate (IRON) 325 (65 Fe) MG TABS Take 1 tablet (325 mg total) by mouth every morning. 01/26/18   Argentina Donovan, PA-C  fluticasone (FLONASE) 50 MCG/ACT nasal spray Place 1 spray into both nostrils daily. 08/12/18   [name], MD  furosemide (LASIX) 40 MG tablet TAKE 1 TABLET(40 MG) BY MOUTH DAILY 12/21/18   [name], MD  gabapentin (NEURONTIN) 300 MG capsule Take 3 capsules (900 mg total) by mouth 2 (two) times daily. 11/09/18   [name], MD  guaiFENesin (MUCINEX) 600 MG 12 hr tablet Take 2 tablets (1,200 mg total) by mouth 2 (two) times daily. 05/20/18   Mikhail,  Velta Addison, DO  hydrALAZINE (APRESOLINE) 10 MG tablet Take 1 tablet (10 mg total) by mouth every 8 (eight) hours for 30 days. 08/04/18 10/26/27  [name], NP  ipratropium (ATROVENT) 0.02 % nebulizer solution Take 2.5 mLs (0.5 mg total) by nebulization 4 (four) times daily. 10/05/18   Charlesetta Shanks, MD  metoprolol tartrate (LOPRESSOR) 50 MG tablet Take 1 tablet (50 mg total) by mouth 2 (two) times daily. 08/12/18   [name], MD  mometasone-formoterol (DULERA) 200-5 MCG/ACT AERO Inhale 2 puffs into the lungs 2 (two) times daily. 12/22/18   Baird Lyons D, MD  montelukast (SINGULAIR) 10 MG tablet Take 1 tablet (10 mg total) by mouth at bedtime. 02/11/19   Baird Lyons D, MD  nicotine (NICODERM CQ - DOSED IN MG/24 HOURS) 21 mg/24hr patch Place 1 patch (21 mg total) onto the skin daily. 05/21/18   Mikhail, Velta Addison, DO  omeprazole (PRILOSEC) 20 MG capsule TAKE 1 CAPSULE(20 MG) BY MOUTH DAILY 12/08/18   [name], MD  potassium chloride SA (KLOR-CON) 20 MEQ tablet Take 1 tablet (20 mEq total) by mouth daily. 01/26/19   [name], MD  Vitamin D, Ergocalciferol, (DRISDOL) 1.25 MG (50000 UT) CAPS capsule TAKE 1 CAPSULE BY MOUTH 1 TIME A WEEK 11/23/18   Newlin, Charlane Ferretti, MD  zolpidem (AMBIEN) 5 MG tablet TAKE 1 TABLET(5 MG) BY MOUTH AT BEDTIME 01/26/19   [name], MD    ALLERGIES:  Allergies  Allergen Reactions  . Contrast Media [Iodinated Diagnostic Agents] Itching    Ct contrast    SOCIAL HISTORY:  Social History   Tobacco Use  . Smoking status: Former Smoker    Packs/day: 0.50    Years: 26.00    Pack years: 13.00    Types: Cigarettes    Quit date: 09/12/2014    Years since quitting: 4.4  . Smokeless tobacco: Never Used  Substance Use Topics  . Alcohol use: No    FAMILY HISTORY: Family History  Problem Relation Age of Onset  . Hypertension Mother   . Asthma Daughter   . Cancer Paternal Aunt   . Asthma Maternal Grandmother     EXAM: BP (!) 106/50   Pulse 89    Temp (!) 97.5 F (36.4 C) (Tympanic)   Resp (!) 28   SpO2 100%  CONSTITUTIONAL: Alert and oriented and responds appropriately to questions.  Morbidly obese, afebrile HEAD: Normocephalic EYES: Conjunctivae clear, pupils appear equal, EOMI ENT: normal nose; moist mucous membranes NECK: Supple, no meningismus, no nuchal rigidity, no LAD  CARD: RRR; S1 and S2 appreciated; no murmurs, no clicks, no rubs, no gallops RESP: Patient is mildly tachypneic.  She is speaking full sentences and not in distress.  No hypoxia on room air at rest.  She has diffuse inspiratory and expiratory wheezing with diminished aeration at her bases bilaterally.  There are no rhonchi or rales appreciated. ABD/GI: Normal bowel sounds; non-distended; soft, non-tender, no rebound, no guarding, no peritoneal signs, no hepatosplenomegaly BACK:  The back appears normal and is non-tender to palpation, there is no CVA tenderness EXT: Normal ROM in all joints; non-tender to palpation; no edema; normal capillary refill; no cyanosis, no calf tenderness or swelling    SKIN: Normal color for age and race; warm; no rash NEURO: Moves all extremities equally PSYCH: The patient's mood and manner are appropriate. Grooming and personal hygiene are appropriate.  MEDICAL DECISION MAKING: Patient here with asthma exacerbation.  History of the same requiring previous intubation, tracheostomy.  No infectious symptoms.  No chest pain.  Received multiple medications with EMS.  Will give continuous albuterol treatment with Atrovent and reassess.  Will obtain labs, chest x-ray.  EKG shows no ischemic change.  She does not appear volume overloaded today.  Patient will likely need admission.  ED PROGRESS: 2:40 AM  Pt having some increased shortness of breath and tachypnea.  CAT started.  Labs show leukocytosis with left shift which appears chronic for patient.  Chest x-ray shows diffuse bronchial thickening likely related to asthma/reactive airway  disease.  I feel less likely due to infection.  3:55 AM  Pt still wheezing after continuous neb however she states she is feeling better.  Will admit for asthma exacerbation.   4:07 AM Discussed patient's case with hospitalist, Dr. Alcario Drought.  I have recommended admission and patient (and family if present) agree with this plan. Admitting physician will place admission orders.   I reviewed all nursing notes, vitals, pertinent previous records, EKGs, lab and urine results, imaging (as available).    CRITICAL CARE Performed by: Pryor Curia   Total critical care time: 55 minutes  Critical care time was exclusive of separately billable procedures and treating other patients.  Critical care was necessary to treat or prevent imminent or life-threatening deterioration.  Critical care was time spent personally by me on the following activities: development of treatment plan with patient and/or surrogate as well as nursing, discussions with consultants, evaluation of patient's response to treatment, examination of patient, obtaining history from patient or surrogate, ordering and performing treatments and interventions, ordering and review of laboratory studies, ordering and review of radiographic studies, pulse oximetry and re-evaluation of patient's condition.    Delesia NYASIAH MAZEY was evaluated in Emergency Department on 02/14/2019 for the symptoms described in the history of present illness. She was evaluated in the context of the global COVID-19 pandemic, which necessitated consideration that the patient might be at risk for infection with the SARS-CoV-2 virus that causes COVID-19. Institutional protocols and algorithms that pertain to the evaluation of patients at risk for COVID-19 are in a state of rapid change based on information released by regulatory bodies including the CDC and federal and state organizations. These policies and algorithms were followed during the patient's care in the ED.     EKG Interpretation  Date/Time:  Sunday February 14 2019 01:16:08 EDT Ventricular Rate:  87 PR Interval:    QRS Duration: 100 QT Interval:  384 QTC Calculation: 462 R Axis:   11 Text Interpretation:  Sinus rhythm Biatrial enlargement Left ventricular hypertrophy Rate slower than previous Confirmed by Meilyn Heindl, Cyril Mourning [phone]) on 02/14/2019 1:19:22 AM         Bree Heinzelman, Delice Bison, DO 02/14/19 [id number]

## 2019-02-14 NOTE — ED Notes (Signed)
Patient requesting to leave AMA. MD paged and states will come down to evaluate patient for discharge. Patient agreed to wait to be seen.

## 2019-02-14 NOTE — ED Notes (Signed)
Called pharmacy to verify medications

## 2019-02-14 NOTE — ED Notes (Signed)
Ordered breakfast--Charice Zuno 

## 2019-02-14 NOTE — ED Notes (Signed)
Informed Respiratory that provider had ordered continuous neb and asked him to start.

## 2019-02-14 NOTE — ED Triage Notes (Signed)
Pt from home, has been struggling w/ Asthma all day, used rescue inhalier several time, no relief.  Was given .3epi, 2 mag, 125 solumedrol, .  86% RA, placed on 10L, 100%.  178/94.  Wheezing in all lobes

## 2019-02-14 NOTE — ED Notes (Signed)
PAGED TRIAD TO RN ANDRUW--Senta Kantor

## 2019-02-14 NOTE — H&P (Signed)
History and Physical    April [name] [id number] DOB: 1971-02-26 DOA: 02/14/2019  PCP: [name], MD  Patient coming from: Home  I have personally briefly reviewed patient's old medical records in [place]  Chief Complaint: Asthma exacerbation  HPI: April [name] is a 48 y.o. female with medical history significant of morbid obesity, OSA, HTN, diastolic CHF.  Patient with extensive history of asthma and admissions for exacerbations.  This will be her 4th admission for asthma this year.  Patient tells me she had been doing quite well with asthma on dupilumab every 2 week injections; however, due to an issue with the prior authorization (which apparently is waiting on approval per Pulm telephone note), she was unable to get her most recent injection a few days ago as scheduled.  She states that after missing this med, her breathing began to get worse over the past couple of days.  She took PO prednisone she had at home without much benefit.  Her symptoms are identical to her prior asthma exacerbations and include wheezing and SOB.  No fevers, chills, cough, CP, known COVID exposures.  EMS gave 125 solumedrol, 2g IV mag, 0.3mg  IM epinephrine.  ED Course: In the ED she was quite wheezy and SOB until she received a continuous neb treatment with albuterol.  Wheezing persists though her breathing is significantly improved she says and she is now able to talk in sentences.  Hospitalist asked to admit.   Review of Systems: As per HPI, otherwise all review of systems negative.  Past Medical History:  Diagnosis Date  . Acanthosis nigricans   . Anxiety   . Arthritis    "knees" (04/28/2017)  . Asthma    Followed by Dr. Annamaria Boots (pulmonology); receives every other week omalizumab injections; has frequent exacerbations  . Chronic diastolic CHF (congestive heart failure) ([place]) 01/17/2017  . COPD (chronic obstructive pulmonary disease) ([place])    PFTs in 2002, FEV1/FVC 65, no post  bronchodilater test done  . Depression   . GERD (gastroesophageal reflux disease)   . Headache(784.0)    "q couple days" (04/28/2017)  . Helicobacter pylori (H. pylori) infection   . Hypertension, essential   . Insomnia   . Menorrhagia   . Morbid obesity ([place])   . OSA on CPAP    Sleep study 2008 - mild OSA, not enough events to titrate CPAP; wears CPAP now/pt on 04/28/2017  . Pneumonia X 1  . Seasonal allergies   . Shortness of breath   . Tobacco user     Past Surgical History:  Procedure Laterality Date  . CARDIOVERSION N/A 05/30/2017   Procedure: CARDIOVERSION;  Surgeon: Sanda Klein, MD;  Location: [facility] ENDOSCOPY;  Service: Cardiovascular;  Laterality: N/A;  . REDUCTION MAMMAPLASTY Bilateral 09/2011  . TUBAL LIGATION  1996   bilateral     reports that she quit smoking about 4 years ago. Her smoking use included cigarettes. She has a 13.00 pack-year smoking history. She has never used smokeless tobacco. She reports that she does not drink alcohol or use drugs.  Allergies  Allergen Reactions  . Contrast Media [Iodinated Diagnostic Agents] Itching    Ct contrast    Family History  Problem Relation Age of Onset  . Hypertension Mother   . Asthma Daughter   . Cancer Paternal Aunt   . Asthma Maternal Grandmother      Prior to Admission medications   Medication Sig Start Date End Date Taking? Authorizing Provider  albuterol (PROVENTIL) (2.5  MG/3ML) 0.083% nebulizer solution USE 1 VIAL VIA NEBULIZER EVERY 6 HOURS AS NEEDED FOR WHEEZING OR SHORTNESS OF BREATH 12/08/18   [name], MD  albuterol (VENTOLIN HFA) 108 (90 Base) MCG/ACT inhaler Inhale 2 puffs into the lungs every 4 (four) hours as needed for wheezing or shortness of breath. 02/11/19   Deneise Lever, MD  apixaban (ELIQUIS) 5 MG TABS tablet Take 1 tablet (5 mg total) by mouth 2 (two) times daily. 08/12/18   [name], MD  cloNIDine (CATAPRES) 0.1 MG tablet Take 1 tablet (0.1 mg total) by mouth at bedtime  as needed. For hot flashes 11/09/18   [name], MD  DULoxetine (CYMBALTA) 60 MG capsule TAKE 1 CAPSULE(60 MG) BY MOUTH DAILY 12/21/18   [name], MD  dupilumab (DUPIXENT) 300 MG/2ML prefilled syringe Inject 300 mg into the skin every 14 (fourteen) days. 11/11/18   Baird Lyons D, MD  EPINEPHrine 0.3 mg/0.3 mL IJ SOAJ injection Inject 0.3 mLs (0.3 mg total) into the muscle as needed for anaphylaxis. 11/19/18   Baird Lyons D, MD  Ferrous Sulfate (IRON) 325 (65 Fe) MG TABS Take 1 tablet (325 mg total) by mouth every morning. 01/26/18   Argentina Donovan, PA-C  fluticasone (FLONASE) 50 MCG/ACT nasal spray Place 1 spray into both nostrils daily. 08/12/18   [name], MD  furosemide (LASIX) 40 MG tablet TAKE 1 TABLET(40 MG) BY MOUTH DAILY 12/21/18   [name], MD  gabapentin (NEURONTIN) 300 MG capsule Take 3 capsules (900 mg total) by mouth 2 (two) times daily. 11/09/18   [name], MD  guaiFENesin (MUCINEX) 600 MG 12 hr tablet Take 2 tablets (1,200 mg total) by mouth 2 (two) times daily. 05/20/18   Mikhail, Velta Addison, DO  hydrALAZINE (APRESOLINE) 10 MG tablet Take 1 tablet (10 mg total) by mouth every 8 (eight) hours for 30 days. 08/04/18 10/26/27  [name], NP  ipratropium (ATROVENT) 0.02 % nebulizer solution Take 2.5 mLs (0.5 mg total) by nebulization 4 (four) times daily. 10/05/18   Charlesetta Shanks, MD  metoprolol tartrate (LOPRESSOR) 50 MG tablet Take 1 tablet (50 mg total) by mouth 2 (two) times daily. 08/12/18   [name], MD  mometasone-formoterol (DULERA) 200-5 MCG/ACT AERO Inhale 2 puffs into the lungs 2 (two) times daily. 12/22/18   Baird Lyons D, MD  montelukast (SINGULAIR) 10 MG tablet Take 1 tablet (10 mg total) by mouth at bedtime. 02/11/19   Baird Lyons D, MD  nicotine (NICODERM CQ - DOSED IN MG/24 HOURS) 21 mg/24hr patch Place 1 patch (21 mg total) onto the skin daily. 05/21/18   Mikhail, Velta Addison, DO  omeprazole (PRILOSEC) 20 MG capsule TAKE 1 CAPSULE(20  MG) BY MOUTH DAILY 12/08/18   [name], MD  potassium chloride SA (KLOR-CON) 20 MEQ tablet Take 1 tablet (20 mEq total) by mouth daily. 01/26/19   [name], MD  Vitamin D, Ergocalciferol, (DRISDOL) 1.25 MG (50000 UT) CAPS capsule TAKE 1 CAPSULE BY MOUTH 1 TIME A WEEK 11/23/18   [name], MD  zolpidem (AMBIEN) 5 MG tablet TAKE 1 TABLET(5 MG) BY MOUTH AT BEDTIME 01/26/19   [name], MD    Physical Exam: Vitals:   02/14/19 0315 02/14/19 0330 02/14/19 0345 02/14/19 0400  BP: (!) 118/104 (!) 154/91 (!) 152/118 (!) 176/130  Pulse:    (!) 108  Resp: (!) 21 (!) 34 (!) 26 (!) 23  Temp:      TempSrc:      SpO2:  99%    Constitutional: NAD, calm, comfortable Eyes: PERRL, lids and conjunctivae normal ENMT: Mucous membranes are moist. Posterior pharynx clear of any exudate or lesions.Normal dentition.  Neck: normal, supple, no masses, no thyromegaly Respiratory: Mild tachypnea, audible wheezing diffusely. Cardiovascular: Regular rate and rhythm, no murmurs / rubs / gallops. No extremity edema. 2+ pedal pulses. No carotid bruits.  Abdomen: no tenderness, no masses palpated. No hepatosplenomegaly. Bowel sounds positive.  Musculoskeletal: no clubbing / cyanosis. No joint deformity upper and lower extremities. Good ROM, no contractures. Normal muscle tone.  Skin: no rashes, lesions, ulcers. No induration Neurologic: CN 2-12 grossly intact. Sensation intact, DTR normal. Strength 5/5 in all 4.  Psychiatric: Normal judgment and insight. Alert and oriented x 3. Normal mood.    Labs on Admission: I have personally reviewed following labs and imaging studies  CBC: Recent Labs  Lab 02/14/19 0132  WBC 21.5*  NEUTROABS 16.9*  HGB 13.0  HCT 41.4  MCV 88.8  PLT 123456*   Basic Metabolic Panel: Recent Labs  Lab 02/14/19 0132  NA 136  K 4.0  CL 103  CO2 21*  GLUCOSE 161*  BUN 11  CREATININE 0.81  CALCIUM 9.0   GFR: Estimated Creatinine Clearance: 134 mL/min (by C-G  formula based on SCr of 0.81 mg/dL). Liver Function Tests: No results for input(s): AST, ALT, ALKPHOS, BILITOT, PROT, ALBUMIN in the last 168 hours. No results for input(s): LIPASE, AMYLASE in the last 168 hours. No results for input(s): AMMONIA in the last 168 hours. Coagulation Profile: No results for input(s): INR, PROTIME in the last 168 hours. Cardiac Enzymes: No results for input(s): CKTOTAL, CKMB, CKMBINDEX, TROPONINI in the last 168 hours. BNP (last 3 results) No results for input(s): PROBNP in the last 8760 hours. HbA1C: No results for input(s): HGBA1C in the last 72 hours. CBG: No results for input(s): GLUCAP in the last 168 hours. Lipid Profile: No results for input(s): CHOL, HDL, LDLCALC, TRIG, CHOLHDL, LDLDIRECT in the last 72 hours. Thyroid Function Tests: No results for input(s): TSH, T4TOTAL, FREET4, T3FREE, THYROIDAB in the last 72 hours. Anemia Panel: No results for input(s): VITAMINB12, FOLATE, FERRITIN, TIBC, IRON, RETICCTPCT in the last 72 hours. Urine analysis:    Component Value Date/Time   COLORURINE YELLOW 10/13/2017 1100   APPEARANCEUR CLEAR 10/13/2017 1100   LABSPEC 1.005 10/13/2017 1100   PHURINE 5.0 10/13/2017 1100   GLUCOSEU NEGATIVE 10/13/2017 1100   GLUCOSEU NEG mg/dL 10/28/2007 2049   HGBUR SMALL (A) 10/13/2017 1100   BILIRUBINUR NEGATIVE 10/13/2017 1100   KETONESUR NEGATIVE 10/13/2017 1100   PROTEINUR NEGATIVE 10/13/2017 1100   UROBILINOGEN 1.0 11/21/2014 0707   NITRITE NEGATIVE 10/13/2017 1100   LEUKOCYTESUR SMALL (A) 10/13/2017 1100    Radiological Exams on Admission: Dg Chest Portable 1 View  Result Date: 02/14/2019 CLINICAL DATA:  Initial evaluation for acute shortness of breath, history of asthma. EXAM: PORTABLE CHEST 1 VIEW COMPARISON:  Prior radiograph from 10/26/2018. FINDINGS: Cardiac and mediastinal silhouettes are stable in size and contour, and remain within normal limits. Lungs mildly hypoinflated. Prominent diffuse  peribronchial thickening, suspected be related to history of asthma. No consolidative airspace disease. No edema or effusion. No pneumothorax. No acute osseous finding. IMPRESSION: Prominent diffuse peribronchial thickening, suspected to be related to history of asthma/reactive airways disease. Sequelae of acute bronchiolitis and/or atypical infection would be the primary differential consideration. Electronically Signed   By: Jeannine Boga M.D.   On: 02/14/2019 01:40    EKG: Independently reviewed.  Assessment/Plan Principal Problem:   Asthma exacerbation Active Problems:   Morbid obesity with body mass index of 50.0-59.9 in adult [facility])   Essential hypertension   Chronic diastolic CHF (congestive heart failure) ([facility])   AF (paroxysmal atrial fibrillation) ([place])   COPD mixed type ([place])    1. Asthma exacerbation - 1. Adult wheeze protocol 2. Dulera scheduled 3. Singulair 4. Atrovent Scheduled 5. PRN albuterol 6. Solumedrol 60mg  IV BID 2. HTN - continue home metoprolol and hydralazine 3. Chronic diastolic CHF - 1. Continue daily lasix 2. CXR not suggestive of pulmonary edema 4. PAF - 1. Continue home metoprolol and eliquis 5. OSA - on CPAP QHS  DVT prophylaxis: Eliquis Code Status: Full Family Communication: No family in room Disposition Plan: Home after admit Consults called: none Admission status: Place in 56     , [place] Hospitalists  How to contact the [facility] Attending or Consulting provider [place] or covering provider during after hours [place], for this patient?  1. Check the care team in [facility] and look for a) attending/consulting [facility] provider listed and b) the [facility] team listed 2. Log into [email or web address]  Amion Physician Scheduling and messaging for groups and whole hospitals  On call and physician scheduling software for group practices, residents, hospitalists and other medical providers for call, clinic, rotation and shift schedules. OnCall  [employer] is a hospital-wide system for scheduling doctors and paging doctors on call. EasyPlot is for scientific plotting and data analysis.  [email or web address]  and use [place]'s universal password to access. If you do not have the password, please contact the hospital operator.  3. Locate the [facility] provider you are looking for under [facility] and page to a number that you can be directly reached. 4. If you still have difficulty reaching the provider, please page the [facility] (Director on Call) for the Hospitalists listed on amion for assistance.  02/14/2019, 5:45 AM

## 2019-02-14 NOTE — Discharge Summary (Signed)
Physician Discharge Summary  April [name] [id number] DOB: [birth year] DOA: 02/14/2019  PCP: [name], MD  Admit date: 02/14/2019 Discharge date: 02/14/2019  Time spent: 35 minutes  Recommendations for Outpatient Follow-up:  Repeat basic metabolic panel to follow electrolytes and renal function Continue assisting patient with smoking cessation and weight loss.  Discharge Diagnoses:  Principal Problem:   Asthma exacerbation Active Problems:   Morbid obesity with body mass index of 50.0-59.9 in adult [facility])   Essential hypertension   Chronic diastolic CHF (congestive heart failure) (HCC)   AF (paroxysmal atrial fibrillation) ([place])   COPD mixed type (HCC) Obstructive sleep apnea. Tobacco abuse  Discharge Condition: Stable and improved.  Patient discharged home with instruction to follow-up with PCP and pulmonology as previously scheduled.  Diet recommendation: Heart healthy low calorie diet.  History of present illness:  As per H&P written by Dr. Alcario Drought on 02/14/2019 48 y.o. female with medical history significant of morbid obesity, OSA, HTN, diastolic CHF.  Patient with extensive history of asthma and admissions for exacerbations.  This will be her 4th admission for asthma this year.  Patient tells me she had been doing quite well with asthma on dupilumab every 2 week injections; however, due to an issue with the prior authorization (which apparently is waiting on approval per Pulm telephone note), she was unable to get her most recent injection a few days ago as scheduled.  She states that after missing this med, her breathing began to get worse over the past couple of days.  She took PO prednisone she had at home without much benefit.  Her symptoms are identical to her prior asthma exacerbations and include wheezing and SOB.  No fevers, chills, cough, CP, known COVID exposures.  EMS gave 125 solumedrol, 2g IV mag, 0.3mg  IM epinephrine.  ED Course: In the ED she  was quite wheezy and SOB until she received a continuous neb treatment with albuterol.  Wheezing persists though her breathing is significantly improved she says and she is now able to talk in sentences.  Hospitalist asked to admit.  Hospital Course:  1-asthma exacerbation -Improved with acute treatment in the emergency department and nebulizer management. -Patient feels close to baseline, speaking in full sentences and demanding to be discharged on a steroid tapering. -She has upcoming follow-up with her pulmonologist and feels that she is safe to go home. -Patient discharge on a steroid tapering, instructions given to resume the use of Dulera, Singulair, Atrovent, PRN albuterol and instructed to follow-up with her PCP and pulmonologist as previously scheduled.  2-hypertension -Continue home metoprolol and hydralazine. -Heart healthy diet has been encouraged.  3-chronic diastolic heart failure -Appears to be stable and compensated -Daily weights, low-sodium/heart healthy diet and appropriate home medication regimen. -Continue outpatient follow-up with cardiology service as previously scheduled.  4-paroxysmal atrial fibrillation -Stable and well-controlled at this time. -Continue the use of metoprolol and Eliquis for secondary prevention. -CHADsVASC score 2  5-morbid obesity -There is no height or weight on file to calculate BMI. -Low calorie diet, portion control and increase physical activity discussed with patient.  6-gastroesophageal reflux disease -Continue PPI; dose adjusted to twice a day for better symptom management.  7-history of hot flashes -continue PRN clonidine as previously instructed.  8-obstructive sleep apnea -Continue CPAP nightly  9-depression -Continue Cymbalta -No suicidal ideation or hallucination.  10-tobacco abuse -Cessation counseling provided -Nicotine patch prescribed.  Procedures:  See below for x-ray  report.  Consultations:  None  Discharge Exam: Vitals:  02/14/19 0715 02/14/19 0730  BP: (!) 150/95 (!) 128/98  Pulse: (!) 107 (!) 105  Resp: (!) 24 (!) 25  Temp:    SpO2: 100% 100%    General: Afebrile, denying chest pain, breathing Significantly better and speaking in full sentences.  Patient asking to be discharged home on a steroids tapering. Cardiovascular: S1-S2, no rubs, no gallops, unable to assess JVD due to body habitus. Respiratory: Mild diffuse expiratory wheezing appreciated on exam; no using accessory muscles, good oxygen saturation of normal respiratory effort. Abdomen: Obese, soft, nontender, positive bowel sounds Extremities: No cyanosis, no clubbing, trace edema bilaterally.  Discharge Instructions   Discharge Instructions    Diet - low sodium heart healthy   Complete by: As directed    Discharge instructions   Complete by: As directed    Take medications as prescribed Follow-up as previously scheduled Maintain adequate hydration Stop smoking Follow heart healthy and low calorie diet.     Allergies as of 02/14/2019      Reactions   Contrast Media [iodinated Diagnostic Agents] Itching   Ct contrast      Medication List    STOP taking these medications   guaiFENesin 600 MG 12 hr tablet Commonly known as: MUCINEX     TAKE these medications   albuterol (2.5 MG/3ML) 0.083% nebulizer solution Commonly known as: PROVENTIL USE 1 VIAL VIA NEBULIZER EVERY 6 HOURS AS NEEDED FOR WHEEZING OR SHORTNESS OF BREATH What changed: See the new instructions.   albuterol 108 (90 Base) MCG/ACT inhaler Commonly known as: VENTOLIN HFA Inhale 2 puffs into the lungs every 4 (four) hours as needed for wheezing or shortness of breath. What changed: Another medication with the same name was changed. Make sure you understand how and when to take each.   apixaban 5 MG Tabs tablet Commonly known as: Eliquis Take 1 tablet (5 mg total) by mouth 2 (two) times daily.    cloNIDine 0.1 MG tablet Commonly known as: CATAPRES Take 1 tablet (0.1 mg total) by mouth at bedtime as needed. For hot flashes   cyclobenzaprine 10 MG tablet Commonly known as: FLEXERIL Take 10 mg by mouth 3 (three) times daily as needed for muscle spasms.   Dulera 200-5 MCG/ACT Aero Generic drug: mometasone-formoterol Inhale 2 puffs into the lungs 2 (two) times daily.   DULoxetine 60 MG capsule Commonly known as: CYMBALTA TAKE 1 CAPSULE(60 MG) BY MOUTH DAILY What changed: See the new instructions.   Dupixent 300 MG/2ML prefilled syringe Generic drug: dupilumab Inject 300 mg into the skin every 14 (fourteen) days.   EPINEPHrine 0.3 mg/0.3 mL Soaj injection Commonly known as: EPI-PEN Inject 0.3 mLs (0.3 mg total) into the muscle as needed for anaphylaxis.   fluticasone 50 MCG/ACT nasal spray Commonly known as: FLONASE Place 1 spray into both nostrils daily.   furosemide 40 MG tablet Commonly known as: LASIX TAKE 1 TABLET(40 MG) BY MOUTH DAILY What changed: See the new instructions.   gabapentin 600 MG tablet Commonly known as: NEURONTIN Take 1,200 mg by mouth 3 (three) times daily.   hydrALAZINE 10 MG tablet Commonly known as: APRESOLINE Take 1 tablet (10 mg total) by mouth every 8 (eight) hours for 30 days.   ipratropium 0.02 % nebulizer solution Commonly known as: ATROVENT Take 2.5 mLs (0.5 mg total) by nebulization 4 (four) times daily.   Iron 325 (65 Fe) MG Tabs Take 1 tablet (325 mg total) by mouth every morning.   metoprolol tartrate 50 MG tablet Commonly known  as: LOPRESSOR Take 1 tablet (50 mg total) by mouth 2 (two) times daily.   montelukast 10 MG tablet Commonly known as: SINGULAIR Take 1 tablet (10 mg total) by mouth at bedtime.   nicotine 21 mg/24hr patch Commonly known as: NICODERM CQ - dosed in mg/24 hours Place 1 patch (21 mg total) onto the skin daily.   omeprazole 20 MG capsule Commonly known as: PRILOSEC Take 1 capsule (20 mg total)  by mouth 2 (two) times daily before a meal. What changed: See the new instructions.   oxyCODONE-acetaminophen 10-325 MG tablet Commonly known as: PERCOCET Take 1 tablet by mouth 5 (five) times daily as needed for pain.   Ozempic (1 MG/DOSE) 2 MG/1.5ML Sopn Generic drug: Semaglutide (1 MG/DOSE) Inject 1 mg into the skin once a week. On wednesdays   phentermine 15 MG capsule Take 15 mg by mouth daily.   potassium chloride SA 20 MEQ tablet Commonly known as: KLOR-CON Take 1 tablet (20 mEq total) by mouth daily.   predniSONE 20 MG tablet Commonly known as: Deltasone Take 3 tablets by mouth daily x1 day; 2 tablets by mouth daily x2 days; then 1 tablet by mouth daily x3 days; then half tablet by mouth daily x3 days and stop prednisone.   pregabalin 75 MG capsule Commonly known as: LYRICA Take 75 mg by mouth 2 (two) times daily.   Vitamin D (Ergocalciferol) 1.25 MG (50000 UT) Caps capsule Commonly known as: DRISDOL TAKE 1 CAPSULE BY MOUTH 1 TIME A WEEK What changed: See the new instructions.   Vyvanse 70 MG capsule Generic drug: lisdexamfetamine Take 70 mg by mouth daily.   zolpidem 5 MG tablet Commonly known as: AMBIEN Take 1 tablet (5 mg total) by mouth at bedtime as needed for sleep. What changed:   how much to take  how to take this  when to take this  reasons to take this  additional instructions      Allergies  Allergen Reactions  . Contrast Media [Iodinated Diagnostic Agents] Itching    Ct contrast   Follow-up Information    [name], MD. Schedule an appointment as soon as possible for a visit in 10 day(s).   Specialty: Family Medicine Contact information: [place] [address] [phone]        Sanda Klein, MD .   Specialty: Cardiology Contact information: [address] [place] [address] [phone]            The results of significant diagnostics from this hospitalization  (including imaging, microbiology, ancillary and laboratory) are listed below for reference.    Significant Diagnostic Studies: Dg Chest Portable 1 View  Result Date: 02/14/2019 CLINICAL DATA:  Initial evaluation for acute shortness of breath, history of asthma. EXAM: PORTABLE CHEST 1 VIEW COMPARISON:  Prior radiograph from 10/26/2018. FINDINGS: Cardiac and mediastinal silhouettes are stable in size and contour, and remain within normal limits. Lungs mildly hypoinflated. Prominent diffuse peribronchial thickening, suspected be related to history of asthma. No consolidative airspace disease. No edema or effusion. No pneumothorax. No acute osseous finding. IMPRESSION: Prominent diffuse peribronchial thickening, suspected to be related to history of asthma/reactive airways disease. Sequelae of acute bronchiolitis and/or atypical infection would be the primary differential consideration. Electronically Signed   By: Jeannine Boga M.D.   On: 02/14/2019 01:40    Microbiology: Recent Results (from the past 240 hour(s))  SARS CORONAVIRUS 2 (TAT 6-24 HRS) Nasopharyngeal Nasopharyngeal Swab     Status: None  Collection Time: 02/14/19  1:33 AM   Specimen: Nasopharyngeal Swab  Result Value Ref Range Status   SARS Coronavirus 2 NEGATIVE NEGATIVE Final    Comment: (NOTE) SARS-CoV-2 target nucleic acids are NOT DETECTED. The SARS-CoV-2 RNA is generally detectable in upper and lower respiratory specimens during the acute phase of infection. Negative results do not preclude SARS-CoV-2 infection, do not rule out co-infections with other pathogens, and should not be used as the sole basis for treatment or other patient management decisions. Negative results must be combined with clinical observations, patient history, and epidemiological information. The expected result is Negative. Fact Sheet for Patients: SugarRoll[employer]be Fact Sheet for Healthcare  Providers: https://www.woods-mathews.com/ This test is not yet approved or cleared by the [place] FDA and  has been authorized for detection and/or diagnosis of SARS-CoV-2 by FDA under an Emergency Use Authorization (EUA). This EUA will remain  in effect (meaning this test can be used) for the duration of the COVID-19 declaration under Section 56 4(b)(1) of the Act, 21 U.S.C. section 360bbb-3(b)(1), unless the authorization is terminated or revoked sooner. Performed at [facility], [place] [address], [place], [place] [address]      Labs: Basic Metabolic Panel: Recent Labs  Lab 02/14/19 0132  NA 136  K 4.0  CL 103  CO2 21*  GLUCOSE 161*  BUN 11  CREATININE 0.81  CALCIUM 9.0   CBC: Recent Labs  Lab 02/14/19 0132  WBC 21.5*  NEUTROABS 16.9*  HGB 13.0  HCT 41.4  MCV 88.8  PLT 484*   BNP (last 3 results) Recent Labs    05/17/18 0447 08/02/18 2013 10/26/18 0055  BNP 176.5* 32.0 61.9    Signed:  Barton Dubois MD.  [facility] 02/14/2019, 9:20 AM

## 2019-02-14 NOTE — ED Notes (Signed)
Requested respiratory start continuous neb

## 2019-02-15 ENCOUNTER — Ambulatory Visit: Payer: Medicare Other | Attending: Family Medicine | Admitting: Family Medicine

## 2019-02-16 NOTE — Telephone Encounter (Addendum)
Recived a fax from [facility]. Pt's Dupixent PA has been approved >> 02/10/2019 - 04/28/2020. Will contact the pt's pharmacy to set up shipment.

## 2019-02-17 ENCOUNTER — Telehealth: Payer: Self-pay | Admitting: Internal Medicine

## 2019-02-17 ENCOUNTER — Encounter (HOSPITAL_COMMUNITY): Payer: Self-pay | Admitting: Emergency Medicine

## 2019-02-17 ENCOUNTER — Other Ambulatory Visit: Payer: Self-pay

## 2019-02-17 ENCOUNTER — Emergency Department (HOSPITAL_COMMUNITY): Payer: Medicare Other

## 2019-02-17 ENCOUNTER — Emergency Department (HOSPITAL_COMMUNITY)
Admission: EM | Admit: 2019-02-17 | Discharge: 2019-02-17 | Disposition: A | Discharge status: Home / Self Care | Payer: Medicare Other | Source: Home / Self Care | Attending: Emergency Medicine | Admitting: Emergency Medicine

## 2019-02-17 DIAGNOSIS — Z6841 Body Mass Index (BMI) 40.0 and over, adult: Secondary | ICD-10-CM | POA: Insufficient documentation

## 2019-02-17 DIAGNOSIS — I4891 Unspecified atrial fibrillation: Secondary | ICD-10-CM | POA: Insufficient documentation

## 2019-02-17 DIAGNOSIS — I110 Hypertensive heart disease with heart failure: Secondary | ICD-10-CM | POA: Insufficient documentation

## 2019-02-17 DIAGNOSIS — R0602 Shortness of breath: Secondary | ICD-10-CM

## 2019-02-17 DIAGNOSIS — I5032 Chronic diastolic (congestive) heart failure: Secondary | ICD-10-CM | POA: Insufficient documentation

## 2019-02-17 DIAGNOSIS — Z79899 Other long term (current) drug therapy: Secondary | ICD-10-CM | POA: Insufficient documentation

## 2019-02-17 DIAGNOSIS — Z7901 Long term (current) use of anticoagulants: Secondary | ICD-10-CM | POA: Insufficient documentation

## 2019-02-17 DIAGNOSIS — Z87891 Personal history of nicotine dependence: Secondary | ICD-10-CM | POA: Insufficient documentation

## 2019-02-17 DIAGNOSIS — J449 Chronic obstructive pulmonary disease, unspecified: Secondary | ICD-10-CM | POA: Insufficient documentation

## 2019-02-17 LAB — COMPREHENSIVE METABOLIC PANEL
ALT: 24 U/L (ref 0–44)
AST: 19 U/L (ref 15–41)
Albumin: 3.1 g/dL — ABNORMAL LOW (ref 3.5–5.0)
Alkaline Phosphatase: 69 U/L (ref 38–126)
Anion gap: 11 (ref 5–15)
BUN: 11 mg/dL (ref 6–20)
CO2: 23 mmol/L (ref 22–32)
Calcium: 8.5 mg/dL — ABNORMAL LOW (ref 8.9–10.3)
Chloride: 104 mmol/L (ref 98–111)
Creatinine, Ser: 0.79 mg/dL (ref 0.44–1.00)
GFR calc Af Amer: >60 mL/min (ref >60)
GFR calc non Af Amer: >60 mL/min (ref >60)
Glucose, Bld: 105 mg/dL — ABNORMAL HIGH (ref 70–99)
Potassium: 3.6 mmol/L (ref 3.5–5.1)
Sodium: 138 mmol/L (ref 135–145)
Total Bilirubin: 0.3 mg/dL (ref 0.3–1.2)
Total Protein: 6.5 g/dL (ref 6.5–8.1)

## 2019-02-17 LAB — CBC WITH DIFFERENTIAL/PLATELET
Abs Immature Granulocytes: 0.72 10*3/uL — ABNORMAL HIGH (ref 0.00–0.07)
Basophils Absolute: 0.1 10*3/uL (ref 0.0–0.1)
Basophils Relative: 0 %
Eosinophils Absolute: 0 10*3/uL (ref 0.0–0.5)
Eosinophils Relative: 0 %
HCT: 38.2 % (ref 36.0–46.0)
Hemoglobin: 12.2 g/dL (ref 12.0–15.0)
Immature Granulocytes: 3 %
Lymphocytes Relative: 19 %
Lymphs Abs: 4.3 10*3/uL — ABNORMAL HIGH (ref 0.7–4.0)
MCH: 27.5 pg (ref 26.0–34.0)
MCHC: 31.9 g/dL (ref 30.0–36.0)
MCV: 86.2 fL (ref 80.0–100.0)
Monocytes Absolute: 2.7 10*3/uL — ABNORMAL HIGH (ref 0.1–1.0)
Monocytes Relative: 12 %
Neutro Abs: 14.8 10*3/uL — ABNORMAL HIGH (ref 1.7–7.7)
Neutrophils Relative %: 66 %
Platelets: 431 10*3/uL — ABNORMAL HIGH (ref 150–400)
RBC: 4.43 MIL/uL (ref 3.87–5.11)
RDW: 17.2 % — ABNORMAL HIGH (ref 11.5–15.5)
WBC: 22.6 10*3/uL — ABNORMAL HIGH (ref 4.0–10.5)
nRBC: 0 % (ref 0.0–0.2)

## 2019-02-17 LAB — BRAIN NATRIURETIC PEPTIDE: B Natriuretic Peptide: 35.9 pg/mL (ref 0.0–100.0)

## 2019-02-17 MED ORDER — HYDROCODONE-HOMATROPINE 5-1.5 MG/5ML PO SYRP: 5.0000 mL | ORAL_SOLUTION | Freq: Four times a day (QID) | ORAL | 0 refills | Status: DC | PRN

## 2019-02-17 MED ORDER — AZITHROMYCIN 250 MG PO TABS: 250.0000 mg | ORAL_TABLET | Freq: Every day | ORAL | 0 refills | Status: DC

## 2019-02-17 MED ORDER — ALBUTEROL SULFATE HFA 108 (90 BASE) MCG/ACT IN AERS
8.0000 | INHALATION_SPRAY | Freq: Once | RESPIRATORY_TRACT | Status: AC
  Administered 2019-02-17: 8 via RESPIRATORY_TRACT
  Filled 2019-02-17: qty 6.7

## 2019-02-17 MED ORDER — MAGNESIUM SULFATE 2 GM/50ML IV SOLN
2.0000 g | Freq: Once | INTRAVENOUS | Status: AC
  Administered 2019-02-17: 2 g via INTRAVENOUS
  Filled 2019-02-17: qty 50

## 2019-02-17 NOTE — Telephone Encounter (Signed)
Called [facility] to set up shipment for pt's [place]. I was on the phone over 30+ minutes due to [facility] stating that pt still needed a PA. Advised that I had an approval letter in my possession and gave the details of that letter. I was then placed on an extremely long hold to only be told that the medication is going through now but I would have to call back to shipment later. Will contact [facility] back tomorrow.

## 2019-02-17 NOTE — ED Provider Notes (Signed)
[facility] Provider Note   CSN: [id number] Arrival date & time: 02/17/19  0251     History   Chief Complaint Chief Complaint  Patient presents with  . Shortness of Breath    HPI April [name] is a 48 y.o. female.     The history is provided by the patient.  Shortness of Breath Severity:  Moderate Onset quality:  Gradual Timing:  Constant Progression:  Unchanged Chronicity:  Recurrent Context: not activity   Relieved by:  Nothing Worsened by:  Nothing Ineffective treatments:  None tried Associated symptoms: cough   Associated symptoms: no abdominal pain, no sore throat and no sputum production     Past Medical History:  Diagnosis Date  . Acanthosis nigricans   . Anxiety   . Arthritis    "knees" (04/28/2017)  . Asthma    Followed by Dr. Annamaria Boots (pulmonology); receives every other week omalizumab injections; has frequent exacerbations  . Chronic diastolic CHF (congestive heart failure) ([place]) 01/17/2017  . COPD (chronic obstructive pulmonary disease) ([place])    PFTs in 2002, FEV1/FVC 65, no post bronchodilater test done  . Depression   . GERD (gastroesophageal reflux disease)   . Headache(784.0)    "q couple days" (04/28/2017)  . Helicobacter pylori (H. pylori) infection   . Hypertension, essential   . Insomnia   . Menorrhagia   . Morbid obesity ([place])   . OSA on CPAP    Sleep study 2008 - mild OSA, not enough events to titrate CPAP; wears CPAP now/pt on 04/28/2017  . Pneumonia X 1  . Seasonal allergies   . Shortness of breath   . Tobacco user     Patient Active Problem List   Diagnosis Date Noted  . Hot flashes   . COPD exacerbation ([place]) 10/26/2018  . Acute respiratory distress 08/03/2018  . Acute exacerbation of chronic obstructive pulmonary disease (COPD) ([place]) 08/03/2018  . Vitamin D deficiency 05/26/2018  . Acute asthma exacerbation 05/16/2018  . Urinary incontinence 11/17/2017  . Nausea with vomiting  11/06/2017  . Hypomagnesemia 10/27/2017  . Dysphagia 10/27/2017  . Physical deconditioning 10/25/2017  . Acute maxillary sinusitis 10/24/2017  . Emesis, persistent 10/08/2017  . Volume depletion 10/08/2017  . Pleuritic chest pain 10/08/2017  . SOB (shortness of breath) 10/08/2017  . Debility 09/22/2017  . HCAP (healthcare-associated pneumonia) 08/06/2017  . Chronic atrial fibrillation 07/29/2017  . Acute respiratory failure with hypoxia ([place]) 07/07/2017  . AF (paroxysmal atrial fibrillation) ([place])   . Dyspnea 02/04/2017  . Chronic diastolic CHF (congestive heart failure) ([place]) 01/17/2017  . Asthma exacerbation 01/17/2017  . Metabolic syndrome 123456  . Moderate persistent asthma with exacerbation 10/19/2016  . Normocytic anemia 10/02/2016  . Elevated hemoglobin A1c 05/05/2016  . GERD (gastroesophageal reflux disease) 08/30/2015  . Anxiety and depression 08/30/2015  . Generalized anxiety disorder 08/30/2015  . Seasonal allergic rhinitis 08/29/2013  . Leukocytosis 10/21/2012  . Tobacco abuse in remission 10/07/2012  . COPD mixed type ([place]) 05/07/2012  . Knee pain, bilateral 04/25/2011  . Primary insomnia 03/14/2011  . Obstructive sleep apnea 12/19/2010  . Hypokalemia 08/13/2010  . Cervical back pain with evidence of disc disease 04/08/2008  . Essential hypertension 07/31/2006  . Morbid obesity with body mass index of 50.0-59.9 in adult ([place]) 06/17/2006  . Major depressive disorder, recurrent episode ([place]) 04/10/2006    Past Surgical History:  Procedure Laterality Date  . CARDIOVERSION N/A 05/30/2017   Procedure: CARDIOVERSION;  Surgeon: Sanda Klein, MD;  Location: [facility];  Service: Cardiovascular;  Laterality: N/A;  . REDUCTION MAMMAPLASTY Bilateral 09/2011  . TUBAL LIGATION  1996   bilateral     OB History   No obstetric history on file.      Home Medications    Prior to Admission medications   Medication Sig Start Date End Date Taking? Authorizing  Provider  albuterol (PROVENTIL) (2.5 MG/3ML) 0.083% nebulizer solution USE 1 VIAL VIA NEBULIZER EVERY 6 HOURS AS NEEDED FOR WHEEZING OR SHORTNESS OF BREATH Patient taking differently: Take 2.5 mg by nebulization every 6 (six) hours as needed for wheezing or shortness of breath.  12/08/18  Yes [name], MD  albuterol (VENTOLIN HFA) 108 (90 Base) MCG/ACT inhaler Inhale 2 puffs into the lungs every 4 (four) hours as needed for wheezing or shortness of breath. 02/11/19  Yes Young, Tarri Fuller D, MD  apixaban (ELIQUIS) 5 MG TABS tablet Take 1 tablet (5 mg total) by mouth 2 (two) times daily. 08/12/18  Yes [name], MD  cloNIDine (CATAPRES) 0.1 MG tablet Take 1 tablet (0.1 mg total) by mouth at bedtime as needed. For hot flashes 02/14/19  Yes Barton Dubois, MD  cyclobenzaprine (FLEXERIL) 10 MG tablet Take 10 mg by mouth 3 (three) times daily as needed for muscle spasms. 01/08/19  Yes [provider]  DULoxetine (CYMBALTA) 60 MG capsule TAKE 1 CAPSULE(60 MG) BY MOUTH DAILY Patient taking differently: Take 60 mg by mouth daily.  12/21/18  Yes Newlin, Enobong, MD  dupilumab (DUPIXENT) 300 MG/2ML prefilled syringe Inject 300 mg into the skin every 14 (fourteen) days. 11/11/18  Yes Young, Clinton D, MD  EPINEPHrine 0.3 mg/0.3 mL IJ SOAJ injection Inject 0.3 mLs (0.3 mg total) into the muscle as needed for anaphylaxis. 11/19/18  Yes Young, Tarri Fuller D, MD  Ferrous Sulfate (IRON) 325 (65 Fe) MG TABS Take 1 tablet (325 mg total) by mouth every morning. Patient taking differently: Take 1 tablet by mouth daily.  01/26/18  Yes McClung, Angela M, PA-C  fluticasone (FLONASE) 50 MCG/ACT nasal spray Place 1 spray into both nostrils daily. 08/12/18  Yes Newlin, Charlane Ferretti, MD  furosemide (LASIX) 40 MG tablet TAKE 1 TABLET(40 MG) BY MOUTH DAILY Patient taking differently: Take 40 mg by mouth daily.  12/21/18  Yes [name], MD  gabapentin (NEURONTIN) 600 MG tablet Take 1,200 mg by mouth 3 (three) times daily.  01/08/19  Yes [provider]  hydrALAZINE (APRESOLINE) 10 MG tablet Take 1 tablet (10 mg total) by mouth every 8 (eight) hours for 30 days. 08/04/18 10/26/27 Yes Black, Lezlie Octave, NP  ipratropium (ATROVENT) 0.02 % nebulizer solution Take 2.5 mLs (0.5 mg total) by nebulization 4 (four) times daily. 10/05/18  Yes Charlesetta Shanks, MD  metoprolol tartrate (LOPRESSOR) 50 MG tablet Take 1 tablet (50 mg total) by mouth 2 (two) times daily. 08/12/18  Yes Newlin, Charlane Ferretti, MD  mometasone-formoterol (DULERA) 200-5 MCG/ACT AERO Inhale 2 puffs into the lungs 2 (two) times daily. 12/22/18  Yes Young, Tarri Fuller D, MD  montelukast (SINGULAIR) 10 MG tablet Take 1 tablet (10 mg total) by mouth at bedtime. 02/11/19  Yes Young, Tarri Fuller D, MD  nicotine (NICODERM CQ - DOSED IN MG/24 HOURS) 21 mg/24hr patch Place 1 patch (21 mg total) onto the skin daily. 02/14/19  Yes Barton Dubois, MD  omeprazole (PRILOSEC) 20 MG capsule Take 1 capsule (20 mg total) by mouth 2 (two) times daily before a meal. 02/14/19  Yes Barton Dubois, MD  oxyCODONE-acetaminophen (PERCOCET) 10-325 MG tablet Take  1 tablet by mouth 5 (five) times daily as needed for pain. 02/09/19  Yes [provider]  OZEMPIC, 1 MG/DOSE, 2 MG/1.5ML SOPN Inject 1 mg into the skin every Wednesday.  01/21/19  Yes [provider]  phentermine 15 MG capsule Take 15 mg by mouth daily. 01/20/19  Yes [provider]  potassium chloride SA (KLOR-CON) 20 MEQ tablet Take 1 tablet (20 mEq total) by mouth daily. 01/26/19  Yes [name], MD  predniSONE (DELTASONE) 20 MG tablet Take 3 tablets by mouth daily x1 day; 2 tablets by mouth daily x2 days; then 1 tablet by mouth daily x3 days; then half tablet by mouth daily x3 days and stop prednisone. Patient taking differently: Take 10-60 mg by mouth See admin instructions. Take 3 tablets by mouth daily x1 day; 2 tablets by mouth daily x2 days; then 1 tablet by mouth daily x3 days; then half tablet by mouth daily  x3 days and stop prednisone. 02/14/19  Yes Barton Dubois, MD  pregabalin (LYRICA) 75 MG capsule Take 75 mg by mouth 2 (two) times daily. 02/09/19  Yes [provider]  Vitamin D, Ergocalciferol, (DRISDOL) 1.25 MG (50000 UT) CAPS capsule TAKE 1 CAPSULE BY MOUTH 1 TIME A WEEK Patient taking differently: Take 50,000 Units by mouth every Wednesday.  11/23/18  Yes Newlin, Enobong, MD  VYVANSE 70 MG capsule Take 70 mg by mouth daily. 01/20/19  Yes [provider]  zolpidem (AMBIEN) 5 MG tablet Take 1 tablet (5 mg total) by mouth at bedtime as needed for sleep. 02/14/19  Yes Barton Dubois, MD  azithromycin (ZITHROMAX) 250 MG tablet Take 1 tablet (250 mg total) by mouth daily. Take first 2 tablets together, then 1 every day until finished. 02/17/19   Jonny Longino, Corene Cornea, MD  HYDROcodone-homatropine [facility]) 5-1.5 MG/5ML syrup Take 5 mLs by mouth every 6 (six) hours as needed for cough. 02/17/19   Cendy Oconnor, Corene Cornea, MD    Family History Family History  Problem Relation Age of Onset  . Hypertension Mother   . Asthma Daughter   . Cancer Paternal Aunt   . Asthma Maternal Grandmother     Social History Social History   Tobacco Use  . Smoking status: Former Smoker    Packs/day: 0.50    Years: 26.00    Pack years: 13.00    Types: Cigarettes    Quit date: 09/12/2014    Years since quitting: 4.4  . Smokeless tobacco: Never Used  Substance Use Topics  . Alcohol use: No  . Drug use: No     Allergies   Contrast media [iodinated diagnostic agents]   Review of Systems Review of Systems  HENT: Negative for sore throat.   Respiratory: Positive for cough and shortness of breath. Negative for sputum production.   Gastrointestinal: Negative for abdominal pain.  All other systems reviewed and are negative.    Physical Exam Updated Vital Signs BP (!) 152/111   Pulse (!) 101   Resp (!) 23   SpO2 99%   Physical Exam Vitals signs and nursing note reviewed.  Constitutional:       Appearance: She is well-developed.  HENT:     Head: Normocephalic and atraumatic.  Neck:     Musculoskeletal: Normal range of motion.  Cardiovascular:     Rate and Rhythm: Normal rate and regular rhythm.  Pulmonary:     Effort: Tachypnea present. No respiratory distress.     Breath sounds: No stridor. Decreased breath sounds and wheezing present.  Chest:     Chest wall: No mass.  Abdominal:     General: There is no distension.  Musculoskeletal: Normal range of motion.     Right lower leg: No edema.     Left lower leg: No edema.  Skin:    General: Skin is warm and dry.  Neurological:     General: No focal deficit present.     Mental Status: She is alert.      ED Treatments / Results  Labs (all labs ordered are listed, but only abnormal results are displayed) Labs Reviewed  CBC WITH DIFFERENTIAL/PLATELET - Abnormal; Notable for the following components:      Result Value   WBC 22.6 (*)    RDW 17.2 (*)    Platelets 431 (*)    Neutro Abs 14.8 (*)    Lymphs Abs 4.3 (*)    Monocytes Absolute 2.7 (*)    Abs Immature Granulocytes 0.72 (*)    All other components within normal limits  COMPREHENSIVE METABOLIC PANEL - Abnormal; Notable for the following components:   Glucose, Bld 105 (*)    Calcium 8.5 (*)    Albumin 3.1 (*)    All other components within normal limits  BRAIN NATRIURETIC PEPTIDE    EKG EKG Interpretation  Date/Time:  Wednesday February 17 2019 03:35:16 EDT Ventricular Rate:  92 PR Interval:    QRS Duration: 92 QT Interval:  382 QTC Calculation: 473 R Axis:   -37 Text Interpretation:  Sinus rhythm LAE, consider biatrial enlargement Left axis deviation Anteroseptal infarct, old No significant change since last tracing Confirmed by Merrily Pew [phone]) on 02/17/2019 4:43:40 AM   Radiology Dg Chest Portable 1 View  Result Date: 02/17/2019 CLINICAL DATA:  Shortness of breath. EXAM: PORTABLE CHEST 1 VIEW COMPARISON:  Multiple prior exams most recent  radiograph 02/14/2019. Most recent CT 10/09/2017 FINDINGS: The cardiomediastinal contours are normal. Improved peribronchial thickening from prior exam. Pulmonary vasculature is normal. No consolidation, pleural effusion, or pneumothorax. No acute osseous abnormalities are seen. IMPRESSION: Improved peribronchial thickening from prior exam. No new abnormality. Electronically Signed   By: Keith Rake M.D.   On: 02/17/2019 03:40    Procedures Procedures (including critical care time)  Medications Ordered in ED Medications  albuterol (VENTOLIN HFA) 108 (90 Base) MCG/ACT inhaler 8 puff (8 puffs Inhalation Given 02/17/19 0334)  magnesium sulfate IVPB 2 g 50 mL (0 g Intravenous Stopped 02/17/19 0444)     Initial Impression / Assessment and Plan / ED Course  I have reviewed the triage vital signs and the nursing notes.  Pertinent labs & imaging results that were available during my care of the patient were reviewed by me and considered in my medical decision making (see chart for details).  Slightly tachypneic, but does not appear to be in distress. Not hyopxic.  She appears to be very similar to how she is described previously.  She was persistently not hypoxic after almost 4 hours in the emergency room.  She did get some breathing treatments.  X-ray and work-up are otherwise negative.  Low suspicion for PE or heart failure exacerbation at this time.  While she was sleeping her oxygen saturation was consistently above 98 and respiratory rate was normal.  She did have audible wheezing but have a feeling this might be her baseline.  She is definitely at risk for decompensation however this time I do not see any indication for further treatment or work-up in the emergency room or admission to  the hospital.  Patient will be discharged with strict return precautions and request to follow-up with her pulmonologist.  Final Clinical Impressions(s) / ED Diagnoses   Final diagnoses:  Shortness of breath     ED Discharge Orders         Ordered    azithromycin (ZITHROMAX) 250 MG tablet  Daily     02/17/19 0643    HYDROcodone-homatropine (HYCODAN) 5-1.5 MG/5ML syrup  Every 6 hours PRN     02/17/19 0643           Diona Peregoy, Corene Cornea, MD 02/17/19 [phone]

## 2019-02-17 NOTE — Telephone Encounter (Signed)
Received a call from [facility]. They called to schedule shipment of [place].  Dupixent Order: 300mg  #2 prefilled syringe Ordered Date: 02/17/2019 Expected date of arrival: 02/18/2019 Ordered by: Desmond Dike, [place]  Specialty Pharmacy: [facility]

## 2019-02-17 NOTE — Telephone Encounter (Signed)
Spoke with pt. She is aware that her PA for Dupixent was approved. Advised her that I would contact her pharmacy today to get shipment set up. Will contact pt back to make appointment once we know when her medication will be here.

## 2019-02-17 NOTE — ED Triage Notes (Signed)
Patient from home with shortness of breath for last couple of days.  Was seen on the 18th and not having any relief of her symptoms.  Wheezing in all fields.  Patient with O2 sat of 95%-100% on RA.  Patient is hypertensive.  HR 90

## 2019-02-17 NOTE — ED Notes (Signed)
Upon discharge patient became agitated saying "I'm just going to have to come right back." Stated she did not feel better. This RN stood with patient at bedside and checked pulse ox for several minutes. A good pleth was noted and while pt had some tachypenia and slight wheezing, her 02 sat stayed above 96%-100% the entire time. Was given inhaler and AVS and informed to come back to ER if she feels it is appropiate

## 2019-02-18 ENCOUNTER — Telehealth: Payer: Self-pay | Admitting: Internal Medicine

## 2019-02-18 ENCOUNTER — Other Ambulatory Visit: Payer: Self-pay

## 2019-02-18 ENCOUNTER — Ambulatory Visit (INDEPENDENT_AMBULATORY_CARE_PROVIDER_SITE_OTHER): Payer: Medicare Other

## 2019-02-18 DIAGNOSIS — J4541 Moderate persistent asthma with (acute) exacerbation: Secondary | ICD-10-CM | POA: Diagnosis not present

## 2019-02-18 MED ORDER — DUPILUMAB 300 MG/2ML ~~LOC~~ SOSY
300.0000 mg | PREFILLED_SYRINGE | Freq: Once | SUBCUTANEOUS | Status: AC
  Administered 2019-02-18: 14:00:00 300 mg via SUBCUTANEOUS

## 2019-02-18 NOTE — Telephone Encounter (Signed)
Please see telephone encounter 02/01/2019.

## 2019-02-18 NOTE — Telephone Encounter (Signed)
Spoke with pt. She has been scheduled to come in today at 1430 to get her injection. Advised her that I would call her if the shipment does not come in by her appointment time.

## 2019-02-18 NOTE — Telephone Encounter (Signed)
Dupixent Shipment Received: 300mg  #2 prefilled syringe Medication arrival date: 02/18/2019 Lot #: [id number] Exp date: 07/2021 Received by: Desmond Dike, [place]

## 2019-02-18 NOTE — Progress Notes (Signed)
All questions were answered by the patient before medication was administered. Have you been hospitalized in the last 10 days? No Do you have a fever? No Do you have a cough? No Do you have a headache or sore throat? No  

## 2019-02-20 ENCOUNTER — Inpatient Hospital Stay (HOSPITAL_COMMUNITY)
Admission: EM | Admit: 2019-02-20 | Discharge: 2019-02-20 | DRG: 291 | Discharge status: Against Medical Advice | Payer: Medicare Other | Attending: Internal Medicine | Admitting: Internal Medicine

## 2019-02-20 ENCOUNTER — Other Ambulatory Visit: Payer: Self-pay

## 2019-02-20 ENCOUNTER — Inpatient Hospital Stay (HOSPITAL_COMMUNITY): Payer: Medicare Other

## 2019-02-20 ENCOUNTER — Encounter (HOSPITAL_COMMUNITY): Payer: Self-pay | Admitting: Emergency Medicine

## 2019-02-20 ENCOUNTER — Emergency Department (HOSPITAL_COMMUNITY): Payer: Medicare Other

## 2019-02-20 DIAGNOSIS — G4733 Obstructive sleep apnea (adult) (pediatric): Secondary | ICD-10-CM | POA: Diagnosis present

## 2019-02-20 DIAGNOSIS — J449 Chronic obstructive pulmonary disease, unspecified: Secondary | ICD-10-CM | POA: Diagnosis present

## 2019-02-20 DIAGNOSIS — Z20828 Contact with and (suspected) exposure to other viral communicable diseases: Secondary | ICD-10-CM | POA: Diagnosis present

## 2019-02-20 DIAGNOSIS — I5030 Unspecified diastolic (congestive) heart failure: Secondary | ICD-10-CM

## 2019-02-20 DIAGNOSIS — Z87891 Personal history of nicotine dependence: Secondary | ICD-10-CM

## 2019-02-20 DIAGNOSIS — K219 Gastro-esophageal reflux disease without esophagitis: Secondary | ICD-10-CM | POA: Diagnosis present

## 2019-02-20 DIAGNOSIS — Z9119 Patient's noncompliance with other medical treatment and regimen: Secondary | ICD-10-CM | POA: Diagnosis not present

## 2019-02-20 DIAGNOSIS — J189 Pneumonia, unspecified organism: Secondary | ICD-10-CM

## 2019-02-20 DIAGNOSIS — I10 Essential (primary) hypertension: Secondary | ICD-10-CM

## 2019-02-20 DIAGNOSIS — Z79899 Other long term (current) drug therapy: Secondary | ICD-10-CM

## 2019-02-20 DIAGNOSIS — I509 Heart failure, unspecified: Secondary | ICD-10-CM

## 2019-02-20 DIAGNOSIS — I110 Hypertensive heart disease with heart failure: Principal | ICD-10-CM | POA: Diagnosis present

## 2019-02-20 DIAGNOSIS — Z7951 Long term (current) use of inhaled steroids: Secondary | ICD-10-CM | POA: Diagnosis not present

## 2019-02-20 DIAGNOSIS — J9601 Acute respiratory failure with hypoxia: Secondary | ICD-10-CM | POA: Diagnosis present

## 2019-02-20 DIAGNOSIS — Z8249 Family history of ischemic heart disease and other diseases of the circulatory system: Secondary | ICD-10-CM | POA: Diagnosis not present

## 2019-02-20 DIAGNOSIS — J45901 Unspecified asthma with (acute) exacerbation: Secondary | ICD-10-CM | POA: Diagnosis present

## 2019-02-20 DIAGNOSIS — R0602 Shortness of breath: Secondary | ICD-10-CM | POA: Diagnosis present

## 2019-02-20 DIAGNOSIS — Z7901 Long term (current) use of anticoagulants: Secondary | ICD-10-CM | POA: Diagnosis not present

## 2019-02-20 DIAGNOSIS — I480 Paroxysmal atrial fibrillation: Secondary | ICD-10-CM | POA: Diagnosis present

## 2019-02-20 DIAGNOSIS — I5033 Acute on chronic diastolic (congestive) heart failure: Secondary | ICD-10-CM | POA: Diagnosis present

## 2019-02-20 DIAGNOSIS — J9600 Acute respiratory failure, unspecified whether with hypoxia or hypercapnia: Secondary | ICD-10-CM

## 2019-02-20 DIAGNOSIS — Z7952 Long term (current) use of systemic steroids: Secondary | ICD-10-CM

## 2019-02-20 DIAGNOSIS — J4551 Severe persistent asthma with (acute) exacerbation: Secondary | ICD-10-CM

## 2019-02-20 LAB — CBC WITH DIFFERENTIAL/PLATELET
Abs Immature Granulocytes: 0.59 10*3/uL — ABNORMAL HIGH (ref 0.00–0.07)
Basophils Absolute: 0.1 10*3/uL (ref 0.0–0.1)
Basophils Relative: 0 %
Eosinophils Absolute: 0.1 10*3/uL (ref 0.0–0.5)
Eosinophils Relative: 0 %
HCT: 37.3 % (ref 36.0–46.0)
Hemoglobin: 11.6 g/dL — ABNORMAL LOW (ref 12.0–15.0)
Immature Granulocytes: 2 %
Lymphocytes Relative: 26 %
Lymphs Abs: 7.6 10*3/uL — ABNORMAL HIGH (ref 0.7–4.0)
MCH: 27.1 pg (ref 26.0–34.0)
MCHC: 31.1 g/dL (ref 30.0–36.0)
MCV: 87.1 fL (ref 80.0–100.0)
Monocytes Absolute: 2.2 10*3/uL — ABNORMAL HIGH (ref 0.1–1.0)
Monocytes Relative: 8 %
Neutro Abs: 18.6 10*3/uL — ABNORMAL HIGH (ref 1.7–7.7)
Neutrophils Relative %: 64 %
Platelets: 399 10*3/uL (ref 150–400)
RBC: 4.28 MIL/uL (ref 3.87–5.11)
RDW: 18.1 % — ABNORMAL HIGH (ref 11.5–15.5)
Smear Review: ADEQUATE
WBC: 29.1 10*3/uL — ABNORMAL HIGH (ref 4.0–10.5)
nRBC: 0.1 % (ref 0.0–0.2)

## 2019-02-20 LAB — POCT I-STAT EG7
Acid-Base Excess: 2 mmol/L (ref 0.0–2.0)
Bicarbonate: 26.9 mmol/L (ref 20.0–28.0)
Calcium, Ion: 1.06 mmol/L — ABNORMAL LOW (ref 1.15–1.40)
HCT: 37 % (ref 36.0–46.0)
Hemoglobin: 12.6 g/dL (ref 12.0–15.0)
O2 Saturation: 100 %
Potassium: 4 mmol/L (ref 3.5–5.1)
Sodium: 135 mmol/L (ref 135–145)
TCO2: 28 mmol/L (ref 22–32)
pCO2, Ven: 40.2 mmHg — ABNORMAL LOW (ref 44.0–60.0)
pH, Ven: 7.433 — ABNORMAL HIGH (ref 7.250–7.430)
pO2, Ven: 164 mmHg — ABNORMAL HIGH (ref 32.0–45.0)

## 2019-02-20 LAB — POCT I-STAT 7, (LYTES, BLD GAS, ICA,H+H)
Acid-Base Excess: 3 mmol/L — ABNORMAL HIGH (ref 0.0–2.0)
Bicarbonate: 27.9 mmol/L (ref 20.0–28.0)
Calcium, Ion: 1.14 mmol/L — ABNORMAL LOW (ref 1.15–1.40)
HCT: 36 % (ref 36.0–46.0)
Hemoglobin: 12.2 g/dL (ref 12.0–15.0)
O2 Saturation: 100 %
Patient temperature: 98.9
Potassium: 3.6 mmol/L (ref 3.5–5.1)
Sodium: 135 mmol/L (ref 135–145)
TCO2: 29 mmol/L (ref 22–32)
pCO2 arterial: 45 mmHg (ref 32.0–48.0)
pH, Arterial: 7.4 (ref 7.350–7.450)
pO2, Arterial: 189 mmHg — ABNORMAL HIGH (ref 83.0–108.0)

## 2019-02-20 LAB — BASIC METABOLIC PANEL
Anion gap: 14 (ref 5–15)
BUN: 10 mg/dL (ref 6–20)
CO2: 22 mmol/L (ref 22–32)
Calcium: 8.4 mg/dL — ABNORMAL LOW (ref 8.9–10.3)
Chloride: 100 mmol/L (ref 98–111)
Creatinine, Ser: 0.73 mg/dL (ref 0.44–1.00)
GFR calc Af Amer: >60 mL/min (ref >60)
GFR calc non Af Amer: >60 mL/min (ref >60)
Glucose, Bld: 170 mg/dL — ABNORMAL HIGH (ref 70–99)
Potassium: 3.9 mmol/L (ref 3.5–5.1)
Sodium: 136 mmol/L (ref 135–145)

## 2019-02-20 LAB — ECHOCARDIOGRAM COMPLETE

## 2019-02-20 LAB — TROPONIN I (HIGH SENSITIVITY): Troponin I (High Sensitivity): 6 ng/L (ref <18)

## 2019-02-20 LAB — SARS CORONAVIRUS 2 BY RT PCR (HOSPITAL ORDER, PERFORMED IN ~~LOC~~ HOSPITAL LAB): SARS Coronavirus 2: NEGATIVE

## 2019-02-20 LAB — BRAIN NATRIURETIC PEPTIDE: B Natriuretic Peptide: 38.1 pg/mL (ref 0.0–100.0)

## 2019-02-20 LAB — PROCALCITONIN: Procalcitonin: <0.1 ng/mL

## 2019-02-20 MED ORDER — HYDRALAZINE HCL 10 MG PO TABS
10.0000 mg | ORAL_TABLET | Freq: Three times a day (TID) | ORAL | Status: DC
  Administered 2019-02-20: 10 mg via ORAL
  Filled 2019-02-20 (×2): qty 1

## 2019-02-20 MED ORDER — ALBUTEROL SULFATE (2.5 MG/3ML) 0.083% IN NEBU: 2.5000 mg | INHALATION_SOLUTION | RESPIRATORY_TRACT | Status: DC | PRN

## 2019-02-20 MED ORDER — SODIUM CHLORIDE 0.9 % IV SOLN
1.0000 g | Freq: Once | INTRAVENOUS | Status: AC
  Administered 2019-02-20: 1 g via INTRAVENOUS
  Filled 2019-02-20: qty 10

## 2019-02-20 MED ORDER — SODIUM CHLORIDE 0.9 % IV SOLN
500.0000 mg | Freq: Once | INTRAVENOUS | Status: AC
  Administered 2019-02-20: 500 mg via INTRAVENOUS
  Filled 2019-02-20: qty 500

## 2019-02-20 MED ORDER — ENOXAPARIN SODIUM 40 MG/0.4ML ~~LOC~~ SOLN: 40.0000 mg | SUBCUTANEOUS | Status: DC

## 2019-02-20 MED ORDER — LORAZEPAM 2 MG/ML IJ SOLN
0.5000 mg | Freq: Once | INTRAMUSCULAR | Status: AC
  Administered 2019-02-20: 0.5 mg via INTRAVENOUS
  Filled 2019-02-20: qty 1

## 2019-02-20 MED ORDER — IPRATROPIUM-ALBUTEROL 0.5-2.5 (3) MG/3ML IN SOLN
3.0000 mL | Freq: Four times a day (QID) | RESPIRATORY_TRACT | Status: DC
  Administered 2019-02-20: 3 mL via RESPIRATORY_TRACT
  Filled 2019-02-20: qty 3

## 2019-02-20 MED ORDER — FUROSEMIDE 10 MG/ML IJ SOLN
40.0000 mg | Freq: Two times a day (BID) | INTRAMUSCULAR | Status: DC
  Administered 2019-02-20: 40 mg via INTRAVENOUS
  Filled 2019-02-20: qty 4

## 2019-02-20 MED ORDER — IPRATROPIUM BROMIDE 0.02 % IN SOLN
1.0000 mg | Freq: Once | RESPIRATORY_TRACT | Status: AC
  Administered 2019-02-20: 1 mg via RESPIRATORY_TRACT
  Filled 2019-02-20: qty 5

## 2019-02-20 MED ORDER — BUDESONIDE 0.25 MG/2ML IN SUSP
0.2500 mg | Freq: Two times a day (BID) | RESPIRATORY_TRACT | Status: DC
  Administered 2019-02-20: 0.25 mg via RESPIRATORY_TRACT
  Filled 2019-02-20 (×2): qty 2

## 2019-02-20 MED ORDER — ACETAMINOPHEN 650 MG RE SUPP: 650.0000 mg | Freq: Four times a day (QID) | RECTAL | Status: DC | PRN

## 2019-02-20 MED ORDER — HYDRALAZINE HCL 20 MG/ML IJ SOLN: 5.0000 mg | INTRAMUSCULAR | Status: DC | PRN

## 2019-02-20 MED ORDER — METHYLPREDNISOLONE SODIUM SUCC 125 MG IJ SOLR
60.0000 mg | Freq: Three times a day (TID) | INTRAMUSCULAR | Status: DC
  Administered 2019-02-20: 60 mg via INTRAVENOUS
  Filled 2019-02-20: qty 2

## 2019-02-20 MED ORDER — ACETAMINOPHEN 325 MG PO TABS: 650.0000 mg | ORAL_TABLET | Freq: Four times a day (QID) | ORAL | Status: DC | PRN

## 2019-02-20 MED ORDER — ALBUTEROL (5 MG/ML) CONTINUOUS INHALATION SOLN
15.0000 mg/h | INHALATION_SOLUTION | Freq: Once | RESPIRATORY_TRACT | Status: AC
  Administered 2019-02-20: 15 mg/h via RESPIRATORY_TRACT
  Filled 2019-02-20: qty 20

## 2019-02-20 MED ORDER — APIXABAN 5 MG PO TABS
5.0000 mg | ORAL_TABLET | Freq: Two times a day (BID) | ORAL | Status: DC
  Administered 2019-02-20: 5 mg via ORAL
  Filled 2019-02-20 (×2): qty 1

## 2019-02-20 MED ORDER — METOPROLOL TARTRATE 25 MG PO TABS
50.0000 mg | ORAL_TABLET | Freq: Two times a day (BID) | ORAL | Status: DC
  Administered 2019-02-20: 50 mg via ORAL
  Filled 2019-02-20: qty 2

## 2019-02-20 NOTE — ED Notes (Signed)
Pt taken off BIPAP and placed on 4 liters [place]. RT at bedside for [facility] treatment

## 2019-02-20 NOTE — Discharge Summary (Signed)
Physician Discharge Summary  April [name] [id number] DOB: 12-Dec-1970 DOA: 02/20/2019  PCP: [name], MD  Admit date: 02/20/2019 Discharge date: 02/20/2019  PATIENT LEFT AGAINST MEDICAL ADVICE  Brief/Interim Summary: 48 y.o. female with medical history significant of asthma and COPD with history of frequent exacerbations followed by pulmonology and receives biweekly dupilumab injections, chronic diastolic congestive heart failure, hypertension, OSA presenting to the ED via EMS with complaints of shortness of breath.  Noted to be wheezing.  She was given albuterol, Atrovent, Solu-Medrol 125 mg, and 2 g mag by EMS.  History limited as patient is on BiPAP.  Reports 1 week history of shortness of breath and wheezing.  States she uses 3 inhalers at home and her last omalizumab injection was earlier this week.  Denies fevers or chills.  Reports having a cough.  Denies orthopnea or lower extremity edema  ED Course: SARS-CoV-2 test negative.  Wheezing with significantly increased work of breathing.  Blood gas reassuring.  Placed on BiPAP.  White blood cell count 29.1, previously elevated as well in the setting of steroid use.  High-sensitivity troponin negative.  Chest x-ray showing mild cardiomegaly with vascular congestion, pneumonia not excluded.  No focal consolidation.  Blood culture x2 pending. Received albuterol, Atrovent, Ativan, ceftriaxone, and azithromycin.  Albuterol continuous neb ordered  Discharge Diagnoses:  Principal Problem:   Acute asthma exacerbation Active Problems:   HTN (hypertension)   AF (paroxysmal atrial fibrillation) (HCC)   Acute respiratory failure (HCC)   CHF exacerbation (HCC)   Acute respiratory failure secondary to acute asthma exacerbation and acute exacerbation of chronic diastolic congestive heart failure. Wheezing with significantly increased work of breathing.  Blood gas reassuring.  Initially required BiPAP. Chest x-ray showing mild cardiomegaly with  vascular congestion, pneumonia not excluded.  No focal consolidation.  BNP likely falsely normal given morbid obesity.  Echo done in January 2020 with grade 2 diastolic dysfunction.  SARS-CoV-2 test negative.   White blood cell count 29.1, previously elevated as well in the setting of steroid use  Patient received IV Solu-Medrol 125 mg by EMS.  She was continued on IV Solu-Medrol 60 mg every 8 hours. DuoNebs every 6 hours.  Pulmicort neb twice daily.  Albuterol nebulizer as needed. Pt reeceived ceftriaxone and azithromycin in the ED. Pt improved overnight while on bipap and with lasix. By the following morning, patient reported feeling better and initially agreed to continue inpatient treatment with steroids and lasix.. Later in the day, patient reportedly became irate about wanting food (when seen earlier, pt had multiple highly caloric snacks at bedsdie and was actively eating candy and drinking sodas). Despite knowing her need for continued hospitalization and risk for leaving, patient elected to leave against medical advice.  Discharge Instructions   Allergies as of 02/20/2019      Reactions   Contrast Media [iodinated Diagnostic Agents] Itching   Ct contrast      Medication List    ASK your doctor about these medications   albuterol (2.5 MG/3ML) 0.083% nebulizer solution Commonly known as: PROVENTIL USE 1 VIAL VIA NEBULIZER EVERY 6 HOURS AS NEEDED FOR WHEEZING OR SHORTNESS OF BREATH   albuterol 108 (90 Base) MCG/ACT inhaler Commonly known as: VENTOLIN HFA Inhale 2 puffs into the lungs every 4 (four) hours as needed for wheezing or shortness of breath.   apixaban 5 MG Tabs tablet Commonly known as: Eliquis Take 1 tablet (5 mg total) by mouth 2 (two) times daily.   azithromycin 250 MG tablet Commonly  known as: ZITHROMAX Take 1 tablet (250 mg total) by mouth daily. Take first 2 tablets together, then 1 every day until finished.   cloNIDine 0.1 MG tablet Commonly known as:  CATAPRES Take 1 tablet (0.1 mg total) by mouth at bedtime as needed. For hot flashes   cyclobenzaprine 10 MG tablet Commonly known as: FLEXERIL Take 10 mg by mouth 3 (three) times daily as needed for muscle spasms.   Dulera 200-5 MCG/ACT Aero Generic drug: mometasone-formoterol Inhale 2 puffs into the lungs 2 (two) times daily.   DULoxetine 60 MG capsule Commonly known as: CYMBALTA TAKE 1 CAPSULE(60 MG) BY MOUTH DAILY   Dupixent 300 MG/2ML prefilled syringe Generic drug: dupilumab Inject 300 mg into the skin every 14 (fourteen) days.   EPINEPHrine 0.3 mg/0.3 mL Soaj injection Commonly known as: EPI-PEN Inject 0.3 mLs (0.3 mg total) into the muscle as needed for anaphylaxis.   fluticasone 50 MCG/ACT nasal spray Commonly known as: FLONASE Place 1 spray into both nostrils daily.   furosemide 40 MG tablet Commonly known as: LASIX TAKE 1 TABLET(40 MG) BY MOUTH DAILY   gabapentin 600 MG tablet Commonly known as: NEURONTIN Take 1,200 mg by mouth 3 (three) times daily.   hydrALAZINE 10 MG tablet Commonly known as: APRESOLINE Take 1 tablet (10 mg total) by mouth every 8 (eight) hours for 30 days.   HYDROcodone-homatropine 5-1.5 MG/5ML syrup Commonly known as: HYCODAN Take 5 mLs by mouth every 6 (six) hours as needed for cough.   ipratropium 0.02 % nebulizer solution Commonly known as: ATROVENT Take 2.5 mLs (0.5 mg total) by nebulization 4 (four) times daily.   Iron 325 (65 Fe) MG Tabs Take 1 tablet (325 mg total) by mouth every morning.   metoprolol tartrate 50 MG tablet Commonly known as: LOPRESSOR Take 1 tablet (50 mg total) by mouth 2 (two) times daily.   montelukast 10 MG tablet Commonly known as: SINGULAIR Take 1 tablet (10 mg total) by mouth at bedtime.   nicotine 21 mg/24hr patch Commonly known as: NICODERM CQ - dosed in mg/24 hours Place 1 patch (21 mg total) onto the skin daily.   omeprazole 20 MG capsule Commonly known as: PRILOSEC Take 1 capsule (20  mg total) by mouth 2 (two) times daily before a meal.   oxyCODONE-acetaminophen 10-325 MG tablet Commonly known as: PERCOCET Take 1 tablet by mouth 5 (five) times daily as needed for pain.   Ozempic (1 MG/DOSE) 2 MG/1.5ML Sopn Generic drug: Semaglutide (1 MG/DOSE) Inject 1 mg into the skin every Wednesday.   phentermine 15 MG capsule Take 15 mg by mouth daily.   potassium chloride SA 20 MEQ tablet Commonly known as: KLOR-CON Take 1 tablet (20 mEq total) by mouth daily.   predniSONE 20 MG tablet Commonly known as: Deltasone Take 3 tablets by mouth daily x1 day; 2 tablets by mouth daily x2 days; then 1 tablet by mouth daily x3 days; then half tablet by mouth daily x3 days and stop prednisone.   pregabalin 75 MG capsule Commonly known as: LYRICA Take 75 mg by mouth 2 (two) times daily.   Vitamin D (Ergocalciferol) 1.25 MG (50000 UT) Caps capsule Commonly known as: DRISDOL TAKE 1 CAPSULE BY MOUTH 1 TIME A WEEK   Vyvanse 70 MG capsule Generic drug: lisdexamfetamine Take 70 mg by mouth daily.   zolpidem 5 MG tablet Commonly known as: AMBIEN Take 1 tablet (5 mg total) by mouth at bedtime as needed for sleep.  Allergies  Allergen Reactions  . Contrast Media [Iodinated Diagnostic Agents] Itching    Ct contrast   Procedures/Studies: Dg Chest Portable 1 View  Result Date: 02/20/2019 CLINICAL DATA:  48 year old female with shortness of breath. EXAM: PORTABLE CHEST 1 VIEW COMPARISON:  Chest radiograph dated 02/17/2019 FINDINGS: Mild cardiomegaly with vascular congestion, progressed since the prior radiograph. Pneumonia is not excluded. Clinical correlation is recommended. No focal consolidation, pleural effusion, pneumothorax. No acute osseous pathology. IMPRESSION: Mild cardiomegaly with vascular congestion. Pneumonia is not excluded. No focal consolidation. Electronically Signed   By: Anner Crete M.D.   On: 02/20/2019 04:06   Dg Chest Portable 1 View  Result Date:  02/17/2019 CLINICAL DATA:  Shortness of breath. EXAM: PORTABLE CHEST 1 VIEW COMPARISON:  Multiple prior exams most recent radiograph 02/14/2019. Most recent CT 10/09/2017 FINDINGS: The cardiomediastinal contours are normal. Improved peribronchial thickening from prior exam. Pulmonary vasculature is normal. No consolidation, pleural effusion, or pneumothorax. No acute osseous abnormalities are seen. IMPRESSION: Improved peribronchial thickening from prior exam. No new abnormality. Electronically Signed   By: Keith Rake M.D.   On: 02/17/2019 03:40   Dg Chest Portable 1 View  Result Date: 02/14/2019 CLINICAL DATA:  Initial evaluation for acute shortness of breath, history of asthma. EXAM: PORTABLE CHEST 1 VIEW COMPARISON:  Prior radiograph from 10/26/2018. FINDINGS: Cardiac and mediastinal silhouettes are stable in size and contour, and remain within normal limits. Lungs mildly hypoinflated. Prominent diffuse peribronchial thickening, suspected be related to history of asthma. No consolidative airspace disease. No edema or effusion. No pneumothorax. No acute osseous finding. IMPRESSION: Prominent diffuse peribronchial thickening, suspected to be related to history of asthma/reactive airways disease. Sequelae of acute bronchiolitis and/or atypical infection would be the primary differential consideration. Electronically Signed   By: Jeannine Boga M.D.   On: 02/14/2019 01:40      Discharge Exam: Vitals:   02/20/19 1116 02/20/19 1321  BP: (!) 162/92 (!) 162/89  Pulse: (!) 115 (!) 110  Resp: 20 20  Temp:    SpO2: 100% 94%   Vitals:   02/20/19 0945 02/20/19 1000 02/20/19 1116 02/20/19 1321  BP: (!) 135/95 (!) 148/88 (!) 162/92 (!) 162/89  Pulse: (!) 111 (!) 119 (!) 115 (!) 110  Resp: 20 20 20 20   Temp:      TempSrc:      SpO2: 100% 100% 100% 94%     The results of significant diagnostics from this hospitalization (including imaging, microbiology, ancillary and laboratory) are  listed below for reference.     Microbiology: Recent Results (from the past 240 hour(s))  SARS CORONAVIRUS 2 (TAT 6-24 HRS) Nasopharyngeal Nasopharyngeal Swab     Status: None   Collection Time: 02/14/19  1:33 AM   Specimen: Nasopharyngeal Swab  Result Value Ref Range Status   SARS Coronavirus 2 NEGATIVE NEGATIVE Final    Comment: (NOTE) SARS-CoV-2 target nucleic acids are NOT DETECTED. The SARS-CoV-2 RNA is generally detectable in upper and lower respiratory specimens during the acute phase of infection. Negative results do not preclude SARS-CoV-2 infection, do not rule out co-infections with other pathogens, and should not be used as the sole basis for treatment or other patient management decisions. Negative results must be combined with clinical observations, patient history, and epidemiological information. The expected result is Negative. Fact Sheet for Patients: SugarRoll.be Fact Sheet for Healthcare Providers: https://www.woods-mathews.com/ This test is not yet approved or cleared by the [place] FDA and  has been authorized for detection and/or diagnosis  of SARS-CoV-2 by FDA under an Emergency Use Authorization (EUA). This EUA will remain  in effect (meaning this test can be used) for the duration of the COVID-19 declaration under Section 56 4(b)(1) of the Act, 21 U.S.C. section 360bbb-3(b)(1), unless the authorization is terminated or revoked sooner. Performed at [facility], [place] [address], [place], [place] [address]   SARS Coronavirus 2 by RT PCR (hospital order, performed in [facility] hospital lab) Nasopharyngeal Nasopharyngeal Swab     Status: None   Collection Time: 02/20/19  3:30 AM   Specimen: Nasopharyngeal Swab  Result Value Ref Range Status   SARS Coronavirus 2 NEGATIVE NEGATIVE Final    Comment: (NOTE) If result is NEGATIVE SARS-CoV-2 target nucleic acids are NOT DETECTED. The SARS-CoV-2 RNA is  generally detectable in upper and lower  respiratory specimens during the acute phase of infection. The lowest  concentration of SARS-CoV-2 viral copies this assay can detect is 250  copies / mL. A negative result does not preclude SARS-CoV-2 infection  and should not be used as the sole basis for treatment or other  patient management decisions.  A negative result may occur with  improper specimen collection / handling, submission of specimen other  than nasopharyngeal swab, presence of viral mutation(s) within the  areas targeted by this assay, and inadequate number of viral copies  (<250 copies / mL). A negative result must be combined with clinical  observations, patient history, and epidemiological information. If result is POSITIVE SARS-CoV-2 target nucleic acids are DETECTED. The SARS-CoV-2 RNA is generally detectable in upper and lower  respiratory specimens dur ing the acute phase of infection.  Positive  results are indicative of active infection with SARS-CoV-2.  Clinical  correlation with patient history and other diagnostic information is  necessary to determine patient infection status.  Positive results do  not rule out bacterial infection or co-infection with other viruses. If result is PRESUMPTIVE POSTIVE SARS-CoV-2 nucleic acids MAY BE PRESENT.   A presumptive positive result was obtained on the submitted specimen  and confirmed on repeat testing.  While 2019 novel coronavirus  (SARS-CoV-2) nucleic acids may be present in the submitted sample  additional confirmatory testing may be necessary for epidemiological  and / or clinical management purposes  to differentiate between  SARS-CoV-2 and other Sarbecovirus currently known to infect humans.  If clinically indicated additional testing with an alternate test  methodology [phone]) is advised. The SARS-CoV-2 RNA is generally  detectable in upper and lower respiratory sp ecimens during the acute  phase of  infection. The expected result is Negative. Fact Sheet for Patients:  StrictlyIdeas.no Fact Sheet for Healthcare Providers: BankingDealers.co.za This test is not yet approved or cleared by the [place] FDA and has been authorized for detection and/or diagnosis of SARS-CoV-2 by FDA under an Emergency Use Authorization (EUA).  This EUA will remain in effect (meaning this test can be used) for the duration of the COVID-19 declaration under Section 564(b)(1) of the Act, 21 U.S.C. section 360bbb-3(b)(1), unless the authorization is terminated or revoked sooner. Performed at [facility], [place] [address], [place], [place] [address]      Labs: BNP (last 3 results) Recent Labs    10/26/18 0055 02/17/19 0323 02/20/19 0400  BNP 61.9 35.9 [id number]   Basic Metabolic Panel: Recent Labs  Lab 02/14/19 0132 02/17/19 0321 02/20/19 0312 02/20/19 0335 02/20/19 0410  NA 136 138 136 135 135  K 4.0 3.6 3.9 4.0 3.6  CL  103 104 100  --   --   CO2 21* 23 22  --   --   GLUCOSE 161* 105* 170*  --   --   BUN 11 11 10   --   --   CREATININE 0.81 0.79 0.73  --   --   CALCIUM 9.0 8.5* 8.4*  --   --    Liver Function Tests: Recent Labs  Lab 02/17/19 0321  AST 19  ALT 24  ALKPHOS 69  BILITOT 0.3  PROT 6.5  ALBUMIN 3.1*   No results for input(s): LIPASE, AMYLASE in the last 168 hours. No results for input(s): AMMONIA in the last 168 hours. CBC: Recent Labs  Lab 02/14/19 0132 02/17/19 0321 02/20/19 0312 02/20/19 0335 02/20/19 0410  WBC 21.5* 22.6* 29.1*  --   --   NEUTROABS 16.9* 14.8* 18.6*  --   --   HGB 13.0 12.2 11.6* 12.6 12.2  HCT 41.4 38.2 37.3 37.0 36.0  MCV 88.8 86.2 87.1  --   --   PLT 484* 431* 399  --   --    Cardiac Enzymes: No results for input(s): CKTOTAL, CKMB, CKMBINDEX, TROPONINI in the last 168 hours. BNP: Invalid input(s): POCBNP CBG: No results for input(s): GLUCAP in the last 168 hours. D-Dimer No  results for input(s): DDIMER in the last 72 hours. Hgb A1c No results for input(s): HGBA1C in the last 72 hours. Lipid Profile No results for input(s): CHOL, HDL, LDLCALC, TRIG, CHOLHDL, LDLDIRECT in the last 72 hours. Thyroid function studies No results for input(s): TSH, T4TOTAL, T3FREE, THYROIDAB in the last 72 hours.  Invalid input(s): FREET3 Anemia work up No results for input(s): VITAMINB12, FOLATE, FERRITIN, TIBC, IRON, RETICCTPCT in the last 72 hours. Urinalysis    Component Value Date/Time   COLORURINE YELLOW 10/13/2017 1100   APPEARANCEUR CLEAR 10/13/2017 1100   LABSPEC 1.005 10/13/2017 1100   PHURINE 5.0 10/13/2017 1100   GLUCOSEU NEGATIVE 10/13/2017 1100   GLUCOSEU NEG mg/dL 10/28/2007 2049   HGBUR SMALL (A) 10/13/2017 1100   BILIRUBINUR NEGATIVE 10/13/2017 1100   KETONESUR NEGATIVE 10/13/2017 1100   PROTEINUR NEGATIVE 10/13/2017 1100   UROBILINOGEN 1.0 11/21/2014 0707   NITRITE NEGATIVE 10/13/2017 1100   LEUKOCYTESUR SMALL (A) 10/13/2017 1100   Sepsis Labs Invalid input(s): PROCALCITONIN,  WBC,  LACTICIDVEN Microbiology Recent Results (from the past 240 hour(s))  SARS CORONAVIRUS 2 (TAT 6-24 HRS) Nasopharyngeal Nasopharyngeal Swab     Status: None   Collection Time: 02/14/19  1:33 AM   Specimen: Nasopharyngeal Swab  Result Value Ref Range Status   SARS Coronavirus 2 NEGATIVE NEGATIVE Final    Comment: (NOTE) SARS-CoV-2 target nucleic acids are NOT DETECTED. The SARS-CoV-2 RNA is generally detectable in upper and lower respiratory specimens during the acute phase of infection. Negative results do not preclude SARS-CoV-2 infection, do not rule out co-infections with other pathogens, and should not be used as the sole basis for treatment or other patient management decisions. Negative results must be combined with clinical observations, patient history, and epidemiological information. The expected result is Negative. Fact Sheet for  Patients: SugarRoll.be Fact Sheet for Healthcare Providers: https://www.woods-mathews.com/ This test is not yet approved or cleared by the [place] FDA and  has been authorized for detection and/or diagnosis of SARS-CoV-2 by FDA under an Emergency Use Authorization (EUA). This EUA will remain  in effect (meaning this test can be used) for the duration of the COVID-19 declaration under Section 56 4(b)(1) of the Act,  21 U.S.C. section 360bbb-3(b)(1), unless the authorization is terminated or revoked sooner. Performed at [facility], [place] [address] [place], [place] [address]   SARS Coronavirus 2 by RT PCR (hospital order, performed in [facility] hospital lab) Nasopharyngeal Nasopharyngeal Swab     Status: None   Collection Time: 02/20/19  3:30 AM   Specimen: Nasopharyngeal Swab  Result Value Ref Range Status   SARS Coronavirus 2 NEGATIVE NEGATIVE Final    Comment: (NOTE) If result is NEGATIVE SARS-CoV-2 target nucleic acids are NOT DETECTED. The SARS-CoV-2 RNA is generally detectable in upper and lower  respiratory specimens during the acute phase of infection. The lowest  concentration of SARS-CoV-2 viral copies this assay can detect is 250  copies / mL. A negative result does not preclude SARS-CoV-2 infection  and should not be used as the sole basis for treatment or other  patient management decisions.  A negative result may occur with  improper specimen collection / handling, submission of specimen other  than nasopharyngeal swab, presence of viral mutation(s) within the  areas targeted by this assay, and inadequate number of viral copies  (<250 copies / mL). A negative result must be combined with clinical  observations, patient history, and epidemiological information. If result is POSITIVE SARS-CoV-2 target nucleic acids are DETECTED. The SARS-CoV-2 RNA is generally detectable in upper and lower  respiratory specimens  dur ing the acute phase of infection.  Positive  results are indicative of active infection with SARS-CoV-2.  Clinical  correlation with patient history and other diagnostic information is  necessary to determine patient infection status.  Positive results do  not rule out bacterial infection or co-infection with other viruses. If result is PRESUMPTIVE POSTIVE SARS-CoV-2 nucleic acids MAY BE PRESENT.   A presumptive positive result was obtained on the submitted specimen  and confirmed on repeat testing.  While 2019 novel coronavirus  (SARS-CoV-2) nucleic acids may be present in the submitted sample  additional confirmatory testing may be necessary for epidemiological  and / or clinical management purposes  to differentiate between  SARS-CoV-2 and other Sarbecovirus currently known to infect humans.  If clinically indicated additional testing with an alternate test  methodology [phone]) is advised. The SARS-CoV-2 RNA is generally  detectable in upper and lower respiratory sp ecimens during the acute  phase of infection. The expected result is Negative. Fact Sheet for Patients:  StrictlyIdeas.no Fact Sheet for Healthcare Providers: BankingDealers.co.za This test is not yet approved or cleared by the [place] FDA and has been authorized for detection and/or diagnosis of SARS-CoV-2 by FDA under an Emergency Use Authorization (EUA).  This EUA will remain in effect (meaning this test can be used) for the duration of the COVID-19 declaration under Section 564(b)(1) of the Act, 21 U.S.C. section 360bbb-3(b)(1), unless the authorization is terminated or revoked sooner. Performed at [facility], [place] [address], [place], [place] [address]     SIGNED:   Marylu Lund, MD  [facility] 02/20/2019, 5:19 PM  If 7PM-7AM, please contact night-coverage

## 2019-02-20 NOTE — ED Notes (Signed)
IV's removed. Patient discharged AMA.

## 2019-02-20 NOTE — ED Notes (Addendum)
Patient rang out on the call bell asking for food and soda. This RN went to the room to assist pt. Advised pt that the doctor admitting her has her NPO. Pt exploded on this RN and got extremely upset and aggravated stating that she had been here since last night and had nothing to eat or drink. Pt stated the doctor just told her she could eat and drink whatever she wanted. This RN again tried to explain to the pt that we could not get her anything to eat or drink until the doctor changes her diet order in the computer. This RN tried to explain this several times with witnesses Ivin Booty and [place] (tech) noticing how rude and hateful she was getting with this Therapist, sports. Pt kicked this RN out of her room stating that this RN was rude and did not help her at all. Again same was all witnessed by other staff.

## 2019-02-20 NOTE — ED Notes (Signed)
Upon entering room, patient insisting that she wants to go home. Patient repeatedly stating that she would like to leave. This nurse reinforced to patient that she stay for her admission and to complete treatment. Patient still insisting to leave.   Spoke with Dr. Wyline Copas and informed him that patient is requesting to leave. Dr. Wyline Copas stated that he spoke with patient as length that she needs to stay and complete the course of treatment. Dr. Wyline Copas stated that if patient leaves it will be AMA.   Patient still requesting to leave.

## 2019-02-20 NOTE — ED Triage Notes (Signed)
Pt BIB [facility] from home, c/o shortness of breath, home albuterol inhaler with no relief, pt also tried epipen as well with no change. Pt given 5mg  albuterol, 0.5mg  atrovent, 125mg  solumedrol, and 2g mag PTA with EMS. Audible wheezing, answering yes/no questions.

## 2019-02-20 NOTE — ED Provider Notes (Signed)
TIME SEEN: 3:20 AM  CHIEF COMPLAINT: Asthma exacerbation  HPI: Patient is a 48 year old female with history of asthma and COPD with medical noncompliance, diastolic heart failure, morbid obesity who presents to the emergency department with complaints of an asthma exacerbation.  States she started feeling short of breath and wheezing today.  Denies any chest pain or chest pressure.  States she has had low-grade temperatures of 99 with productive cough.  No lower extremity swelling or pain.  Does not wear oxygen at home.  Has had previous intubations and tracheostomy secondary to her respiratory failure.  ROS: See HPI Constitutional: no fever  Eyes: no drainage  ENT: no runny nose   Cardiovascular:  no chest pain  Resp:  SOB  GI: no vomiting GU: no dysuria Integumentary: no rash  Allergy: no hives  Musculoskeletal: no leg swelling  Neurological: no slurred speech ROS otherwise negative  PAST MEDICAL HISTORY/PAST SURGICAL HISTORY:  Past Medical History:  Diagnosis Date  . Acanthosis nigricans   . Anxiety   . Arthritis    "knees" (04/28/2017)  . Asthma    Followed by Dr. Annamaria Boots (pulmonology); receives every other week omalizumab injections; has frequent exacerbations  . Chronic diastolic CHF (congestive heart failure) ([place]) 01/17/2017  . COPD (chronic obstructive pulmonary disease) ([place])    PFTs in 2002, FEV1/FVC 65, no post bronchodilater test done  . Depression   . GERD (gastroesophageal reflux disease)   . Headache(784.0)    "q couple days" (04/28/2017)  . Helicobacter pylori (H. pylori) infection   . Hypertension, essential   . Insomnia   . Menorrhagia   . Morbid obesity ([place])   . OSA on CPAP    Sleep study 2008 - mild OSA, not enough events to titrate CPAP; wears CPAP now/pt on 04/28/2017  . Pneumonia X 1  . Seasonal allergies   . Shortness of breath   . Tobacco user     MEDICATIONS:  Prior to Admission medications   Medication Sig Start Date End Date Taking?  Authorizing Provider  albuterol (PROVENTIL) (2.5 MG/3ML) 0.083% nebulizer solution USE 1 VIAL VIA NEBULIZER EVERY 6 HOURS AS NEEDED FOR WHEEZING OR SHORTNESS OF BREATH Patient taking differently: Take 2.5 mg by nebulization every 6 (six) hours as needed for wheezing or shortness of breath.  12/08/18   [name], MD  albuterol (VENTOLIN HFA) 108 (90 Base) MCG/ACT inhaler Inhale 2 puffs into the lungs every 4 (four) hours as needed for wheezing or shortness of breath. 02/11/19   Deneise Lever, MD  apixaban (ELIQUIS) 5 MG TABS tablet Take 1 tablet (5 mg total) by mouth 2 (two) times daily. 08/12/18   [name], MD  azithromycin (ZITHROMAX) 250 MG tablet Take 1 tablet (250 mg total) by mouth daily. Take first 2 tablets together, then 1 every day until finished. 02/17/19   Mesner, Corene Cornea, MD  cloNIDine (CATAPRES) 0.1 MG tablet Take 1 tablet (0.1 mg total) by mouth at bedtime as needed. For hot flashes 02/14/19   Barton Dubois, MD  cyclobenzaprine (FLEXERIL) 10 MG tablet Take 10 mg by mouth 3 (three) times daily as needed for muscle spasms. 01/08/19   [provider]  DULoxetine (CYMBALTA) 60 MG capsule TAKE 1 CAPSULE(60 MG) BY MOUTH DAILY Patient taking differently: Take 60 mg by mouth daily.  12/21/18   [name], MD  dupilumab (DUPIXENT) 300 MG/2ML prefilled syringe Inject 300 mg into the skin every 14 (fourteen) days. 11/11/18   Deneise Lever, MD  EPINEPHrine 0.3  mg/0.3 mL IJ SOAJ injection Inject 0.3 mLs (0.3 mg total) into the muscle as needed for anaphylaxis. 11/19/18   Baird Lyons D, MD  Ferrous Sulfate (IRON) 325 (65 Fe) MG TABS Take 1 tablet (325 mg total) by mouth every morning. Patient taking differently: Take 1 tablet by mouth daily.  01/26/18   Argentina Donovan, PA-C  fluticasone (FLONASE) 50 MCG/ACT nasal spray Place 1 spray into both nostrils daily. 08/12/18   [name], MD  furosemide (LASIX) 40 MG tablet TAKE 1 TABLET(40 MG) BY MOUTH DAILY Patient taking  differently: Take 40 mg by mouth daily.  12/21/18   [name], MD  gabapentin (NEURONTIN) 600 MG tablet Take 1,200 mg by mouth 3 (three) times daily. 01/08/19   [provider]  hydrALAZINE (APRESOLINE) 10 MG tablet Take 1 tablet (10 mg total) by mouth every 8 (eight) hours for 30 days. 08/04/18 10/26/27  Black, Lezlie Octave, NP  HYDROcodone-homatropine (HYCODAN) 5-1.5 MG/5ML syrup Take 5 mLs by mouth every 6 (six) hours as needed for cough. 02/17/19   Mesner, Corene Cornea, MD  ipratropium (ATROVENT) 0.02 % nebulizer solution Take 2.5 mLs (0.5 mg total) by nebulization 4 (four) times daily. 10/05/18   Charlesetta Shanks, MD  metoprolol tartrate (LOPRESSOR) 50 MG tablet Take 1 tablet (50 mg total) by mouth 2 (two) times daily. 08/12/18   [name], MD  mometasone-formoterol (DULERA) 200-5 MCG/ACT AERO Inhale 2 puffs into the lungs 2 (two) times daily. 12/22/18   Baird Lyons D, MD  montelukast (SINGULAIR) 10 MG tablet Take 1 tablet (10 mg total) by mouth at bedtime. 02/11/19   Baird Lyons D, MD  nicotine (NICODERM CQ - DOSED IN MG/24 HOURS) 21 mg/24hr patch Place 1 patch (21 mg total) onto the skin daily. 02/14/19   Barton Dubois, MD  omeprazole (PRILOSEC) 20 MG capsule Take 1 capsule (20 mg total) by mouth 2 (two) times daily before a meal. 02/14/19   Barton Dubois, MD  oxyCODONE-acetaminophen (PERCOCET) 10-325 MG tablet Take 1 tablet by mouth 5 (five) times daily as needed for pain. 02/09/19   [provider]  OZEMPIC, 1 MG/DOSE, 2 MG/1.5ML SOPN Inject 1 mg into the skin every Wednesday.  01/21/19   [provider]  phentermine 15 MG capsule Take 15 mg by mouth daily. 01/20/19   [provider]  potassium chloride SA (KLOR-CON) 20 MEQ tablet Take 1 tablet (20 mEq total) by mouth daily. 01/26/19   [name], MD  predniSONE (DELTASONE) 20 MG tablet Take 3 tablets by mouth daily x1 day; 2 tablets by mouth daily x2 days; then 1 tablet by mouth daily x3 days; then half  tablet by mouth daily x3 days and stop prednisone. Patient taking differently: Take 10-60 mg by mouth See admin instructions. Take 3 tablets by mouth daily x1 day; 2 tablets by mouth daily x2 days; then 1 tablet by mouth daily x3 days; then half tablet by mouth daily x3 days and stop prednisone. 02/14/19   Barton Dubois, MD  pregabalin (LYRICA) 75 MG capsule Take 75 mg by mouth 2 (two) times daily. 02/09/19   [provider]  Vitamin D, Ergocalciferol, (DRISDOL) 1.25 MG (50000 UT) CAPS capsule TAKE 1 CAPSULE BY MOUTH 1 TIME A WEEK Patient taking differently: Take 50,000 Units by mouth every Wednesday.  11/23/18   [name], MD  VYVANSE 70 MG capsule Take 70 mg by mouth daily. 01/20/19   [provider]  zolpidem (AMBIEN) 5 MG tablet Take 1 tablet (5  mg total) by mouth at bedtime as needed for sleep. 02/14/19   Barton Dubois, MD    ALLERGIES:  Allergies  Allergen Reactions  . Contrast Media [Iodinated Diagnostic Agents] Itching    Ct contrast    SOCIAL HISTORY:  Social History   Tobacco Use  . Smoking status: Former Smoker    Packs/day: 0.50    Years: 26.00    Pack years: 13.00    Types: Cigarettes    Quit date: 09/12/2014    Years since quitting: 4.4  . Smokeless tobacco: Never Used  Substance Use Topics  . Alcohol use: No    FAMILY HISTORY: Family History  Problem Relation Age of Onset  . Hypertension Mother   . Asthma Daughter   . Cancer Paternal Aunt   . Asthma Maternal Grandmother     EXAM: BP (!) 172/94 (BP Location: Right Arm)   Pulse (!) 108   Temp 98.9 F (37.2 C) (Axillary)   Resp (!) 22   SpO2 100%  CONSTITUTIONAL: Alert and oriented and responds appropriately to questions.  Morbidly obese HEAD: Normocephalic EYES: Conjunctivae clear, pupils appear equal, EOMI ENT: normal nose; moist mucous membranes NECK: Supple, no meningismus, no nuchal rigidity, no LAD  CARD: Regular and tachycardic; S1 and S2 appreciated; no murmurs, no  clicks, no rubs, no gallops RESP: Patient is tachypneic.  She has inspiratory and expiratory wheezes on exam and is diminished at her bases.  No rhonchi or rales.  She is in moderate respiratory distress and speaking short sentences. ABD/GI: Normal bowel sounds; non-distended; soft, non-tender, no rebound, no guarding, no peritoneal signs, no hepatosplenomegaly BACK:  The back appears normal and is non-tender to palpation, there is no CVA tenderness EXT: Normal ROM in all joints; non-tender to palpation; no edema; normal capillary refill; no cyanosis, no calf tenderness or swelling    SKIN: Normal color for age and race; warm; no rash NEURO: Moves all extremities equally PSYCH: The patient's mood and manner are appropriate. Grooming and personal hygiene are appropriate.  MEDICAL DECISION MAKING: Patient here with what I suspect is an asthma exacerbation.  Differential also includes CHF exacerbation, pneumonia Covid COVID-19.  She has been a moved emergently to negative pressure room and placed on BiPAP due to increased work of breathing.  She required a small dose of IV Ativan to be able to tolerate BiPAP mask.  Her blood gas is reassuring.  She does report low-grade temperatures of 99 at home and has had a productive cough.  She does have a leukocytosis of 29,000 which may be from recent steroid use but given this looks worse than her baseline with possible infectious symptoms and a chest x-ray that is concerning for edema versus pneumonia, will give IV ceftriaxone and azithromycin for community-acquired pneumonia.  Her Covid swab is pending.  Will monitor patient very closely.  Will check BNP, troponin.  EKG shows no ischemic abnormality.  ED PROGRESS: Patient improving on BiPAP.  Agrees to admission.  Patient is Covid negative.  Troponin VI.  BNP 38.  Doubt chest x-ray findings from edema.  She is being treated for community-acquired pneumonia.  Last admission was in June.  She was admitted on  02/14/2019 but left AMA from the ED.  6:04 AM  Discussed patient's case with hospitalist, Dr. Marlowe Sax.  I have recommended admission and patient (and family if present) agree with this plan. Admitting physician will place admission orders.   I reviewed all nursing notes, vitals, pertinent previous records, EKGs,  lab and urine results, imaging (as available).    April [name] was evaluated in Emergency Department on 02/20/2019 for the symptoms described in the history of present illness. She was evaluated in the context of the global COVID-19 pandemic, which necessitated consideration that the patient might be at risk for infection with the SARS-CoV-2 virus that causes COVID-19. Institutional protocols and algorithms that pertain to the evaluation of patients at risk for COVID-19 are in a state of rapid change based on information released by regulatory bodies including the CDC and federal and state organizations. These policies and algorithms were followed during the patient's care in the ED.    EKG Interpretation  Date/Time:  Saturday February 20 2019 03:13:11 EDT Ventricular Rate:  111 PR Interval:    QRS Duration: 88 QT Interval:  350 QTC Calculation: 476 R Axis:   0 Text Interpretation:  Sinus tachycardia Ventricular premature complex Probable left atrial enlargement Probable anterior infarct, old No significant change since last tracing other than rate is faster Confirmed by Ahmyah Gidley, Cyril Mourning [id number]) on 02/20/2019 3:19:27 AM        CRITICAL CARE Performed by: Cyril Mourning Brittania Sudbeck   Total critical care time: 55 minutes  Critical care time was exclusive of separately billable procedures and treating other patients.  Critical care was necessary to treat or prevent imminent or life-threatening deterioration.  Critical care was time spent personally by me on the following activities: development of treatment plan with patient and/or surrogate as well as nursing, discussions with consultants,  evaluation of patient's response to treatment, examination of patient, obtaining history from patient or surrogate, ordering and performing treatments and interventions, ordering and review of laboratory studies, ordering and review of radiographic studies, pulse oximetry and re-evaluation of patient's condition.    Mickie Badders, Delice Bison, DO 02/20/19 [phone]

## 2019-02-20 NOTE — Progress Notes (Signed)
  Echocardiogram 2D Echocardiogram has been performed.  April [name] 02/20/2019, 11:34 AM

## 2019-02-20 NOTE — ED Notes (Signed)
Pt has pulled ecg and monitoring cables. Pt dressed setting in chair at side of bed.

## 2019-02-20 NOTE — ED Notes (Signed)
Pt called upset because she could not have anything to eat, stating she needs to speak to a supervisor. Charge nurse notified. After speaking to Dr Wyline Copas pt given [place] sandwich

## 2019-02-20 NOTE — H&P (Signed)
History and Physical    TRANAE EICKMEYER [id number] DOB: 1971-03-26 DOA: 02/20/2019  PCP: [name], MD Patient coming from: Home  Chief Complaint: Shortness of breath  HPI: April [name] is a 48 y.o. female with medical history significant of asthma and COPD with history of frequent exacerbations followed by pulmonology and receives biweekly dupilumab injections, chronic diastolic congestive heart failure, hypertension, OSA presenting to the ED via EMS with complaints of shortness of breath.  Noted to be wheezing.  She was given albuterol, Atrovent, Solu-Medrol 125 mg, and 2 g mag by EMS.  History limited as patient is on BiPAP.  Reports 1 week history of shortness of breath and wheezing.  States she uses 3 inhalers at home and her last omalizumab injection was earlier this week.  Denies fevers or chills.  Reports having a cough.  Denies orthopnea or lower extremity edema.  No additional history could be obtained from her at this time.  ED Course: SARS-CoV-2 test negative.  Wheezing with significantly increased work of breathing.  Blood gas reassuring.  Placed on BiPAP.  White blood cell count 29.1, previously elevated as well in the setting of steroid use.  High-sensitivity troponin negative.  Chest x-ray showing mild cardiomegaly with vascular congestion, pneumonia not excluded.  No focal consolidation.  Blood culture x2 pending. Received albuterol, Atrovent, Ativan, ceftriaxone, and azithromycin.  Albuterol continuous neb ordered.  Review of Systems:  All systems reviewed and apart from history of presenting illness, are negative.  Past Medical History:  Diagnosis Date  . Acanthosis nigricans   . Anxiety   . Arthritis    "knees" (04/28/2017)  . Asthma    Followed by Dr. Annamaria Boots (pulmonology); receives every other week omalizumab injections; has frequent exacerbations  . Chronic diastolic CHF (congestive heart failure) ([place]) 01/17/2017  . COPD (chronic obstructive pulmonary disease)  ([place])    PFTs in 2002, FEV1/FVC 65, no post bronchodilater test done  . Depression   . GERD (gastroesophageal reflux disease)   . Headache(784.0)    "q couple days" (04/28/2017)  . Helicobacter pylori (H. pylori) infection   . Hypertension, essential   . Insomnia   . Menorrhagia   . Morbid obesity ([place])   . OSA on CPAP    Sleep study 2008 - mild OSA, not enough events to titrate CPAP; wears CPAP now/pt on 04/28/2017  . Pneumonia X 1  . Seasonal allergies   . Shortness of breath   . Tobacco user     Past Surgical History:  Procedure Laterality Date  . CARDIOVERSION N/A 05/30/2017   Procedure: CARDIOVERSION;  Surgeon: Sanda Klein, MD;  Location: [facility];  Service: Cardiovascular;  Laterality: N/A;  . REDUCTION MAMMAPLASTY Bilateral 09/2011  . TUBAL LIGATION  1996   bilateral     reports that she quit smoking about 4 years ago. Her smoking use included cigarettes. She has a 13.00 pack-year smoking history. She has never used smokeless tobacco. She reports that she does not drink alcohol or use drugs.  Allergies  Allergen Reactions  . Contrast Media [Iodinated Diagnostic Agents] Itching    Ct contrast    Family History  Problem Relation Age of Onset  . Hypertension Mother   . Asthma Daughter   . Cancer Paternal Aunt   . Asthma Maternal Grandmother     Prior to Admission medications   Medication Sig Start Date End Date Taking? Authorizing Provider  albuterol (PROVENTIL) (2.5 MG/3ML) 0.083% nebulizer solution USE 1 VIAL VIA NEBULIZER EVERY  6 HOURS AS NEEDED FOR WHEEZING OR SHORTNESS OF BREATH Patient taking differently: Take 2.5 mg by nebulization every 6 (six) hours as needed for wheezing or shortness of breath.  12/08/18   [name], MD  albuterol (VENTOLIN HFA) 108 (90 Base) MCG/ACT inhaler Inhale 2 puffs into the lungs every 4 (four) hours as needed for wheezing or shortness of breath. 02/11/19   Deneise Lever, MD  apixaban (ELIQUIS) 5 MG TABS tablet Take  1 tablet (5 mg total) by mouth 2 (two) times daily. 08/12/18   [name], MD  azithromycin (ZITHROMAX) 250 MG tablet Take 1 tablet (250 mg total) by mouth daily. Take first 2 tablets together, then 1 every day until finished. 02/17/19   Mesner, Corene Cornea, MD  cloNIDine (CATAPRES) 0.1 MG tablet Take 1 tablet (0.1 mg total) by mouth at bedtime as needed. For hot flashes 02/14/19   Barton Dubois, MD  cyclobenzaprine (FLEXERIL) 10 MG tablet Take 10 mg by mouth 3 (three) times daily as needed for muscle spasms. 01/08/19   [provider]  DULoxetine (CYMBALTA) 60 MG capsule TAKE 1 CAPSULE(60 MG) BY MOUTH DAILY Patient taking differently: Take 60 mg by mouth daily.  12/21/18   [name], MD  dupilumab (DUPIXENT) 300 MG/2ML prefilled syringe Inject 300 mg into the skin every 14 (fourteen) days. 11/11/18   Baird Lyons D, MD  EPINEPHrine 0.3 mg/0.3 mL IJ SOAJ injection Inject 0.3 mLs (0.3 mg total) into the muscle as needed for anaphylaxis. 11/19/18   Baird Lyons D, MD  Ferrous Sulfate (IRON) 325 (65 Fe) MG TABS Take 1 tablet (325 mg total) by mouth every morning. Patient taking differently: Take 1 tablet by mouth daily.  01/26/18   Argentina Donovan, PA-C  fluticasone (FLONASE) 50 MCG/ACT nasal spray Place 1 spray into both nostrils daily. 08/12/18   [name], MD  furosemide (LASIX) 40 MG tablet TAKE 1 TABLET(40 MG) BY MOUTH DAILY Patient taking differently: Take 40 mg by mouth daily.  12/21/18   [name], MD  gabapentin (NEURONTIN) 600 MG tablet Take 1,200 mg by mouth 3 (three) times daily. 01/08/19   [provider]  hydrALAZINE (APRESOLINE) 10 MG tablet Take 1 tablet (10 mg total) by mouth every 8 (eight) hours for 30 days. 08/04/18 10/26/27  Black, Lezlie Octave, NP  HYDROcodone-homatropine (HYCODAN) 5-1.5 MG/5ML syrup Take 5 mLs by mouth every 6 (six) hours as needed for cough. 02/17/19   Mesner, Corene Cornea, MD  ipratropium (ATROVENT) 0.02 % nebulizer solution Take 2.5 mLs (0.5  mg total) by nebulization 4 (four) times daily. 10/05/18   Charlesetta Shanks, MD  metoprolol tartrate (LOPRESSOR) 50 MG tablet Take 1 tablet (50 mg total) by mouth 2 (two) times daily. 08/12/18   [name], MD  mometasone-formoterol (DULERA) 200-5 MCG/ACT AERO Inhale 2 puffs into the lungs 2 (two) times daily. 12/22/18   Baird Lyons D, MD  montelukast (SINGULAIR) 10 MG tablet Take 1 tablet (10 mg total) by mouth at bedtime. 02/11/19   Baird Lyons D, MD  nicotine (NICODERM CQ - DOSED IN MG/24 HOURS) 21 mg/24hr patch Place 1 patch (21 mg total) onto the skin daily. 02/14/19   Barton Dubois, MD  omeprazole (PRILOSEC) 20 MG capsule Take 1 capsule (20 mg total) by mouth 2 (two) times daily before a meal. 02/14/19   Barton Dubois, MD  oxyCODONE-acetaminophen (PERCOCET) 10-325 MG tablet Take 1 tablet by mouth 5 (five) times daily as needed for pain. 02/09/19   [provider]  OZEMPIC, 1 MG/DOSE, 2 MG/1.5ML SOPN Inject 1 mg into the skin every Wednesday.  01/21/19   [provider]  phentermine 15 MG capsule Take 15 mg by mouth daily. 01/20/19   [provider]  potassium chloride SA (KLOR-CON) 20 MEQ tablet Take 1 tablet (20 mEq total) by mouth daily. 01/26/19   [name], MD  predniSONE (DELTASONE) 20 MG tablet Take 3 tablets by mouth daily x1 day; 2 tablets by mouth daily x2 days; then 1 tablet by mouth daily x3 days; then half tablet by mouth daily x3 days and stop prednisone. Patient taking differently: Take 10-60 mg by mouth See admin instructions. Take 3 tablets by mouth daily x1 day; 2 tablets by mouth daily x2 days; then 1 tablet by mouth daily x3 days; then half tablet by mouth daily x3 days and stop prednisone. 02/14/19   Barton Dubois, MD  pregabalin (LYRICA) 75 MG capsule Take 75 mg by mouth 2 (two) times daily. 02/09/19   [provider]  Vitamin D, Ergocalciferol, (DRISDOL) 1.25 MG (50000 UT) CAPS capsule TAKE 1 CAPSULE BY MOUTH 1 TIME A WEEK  Patient taking differently: Take 50,000 Units by mouth every Wednesday.  11/23/18   [name], MD  VYVANSE 70 MG capsule Take 70 mg by mouth daily. 01/20/19   [provider]  zolpidem (AMBIEN) 5 MG tablet Take 1 tablet (5 mg total) by mouth at bedtime as needed for sleep. 02/14/19   Barton Dubois, MD    Physical Exam: Vitals:   02/20/19 0547 02/20/19 0636 02/20/19 0700 02/20/19 0745  BP:  (!) 180/101 (!) 108/92 (!) 148/86  Pulse: (!) 110 (!) 105  (!) 110  Resp: (!) 25 (!) 25 20 (!) 21  Temp:      TempSrc:      SpO2: 100% 100%  100%    Physical Exam  Constitutional: She is oriented to person, place, and time. She appears well-developed and well-nourished. No distress.  HENT:  Head: Normocephalic.  Eyes: Right eye exhibits no discharge. Left eye exhibits no discharge.  Neck: Neck supple.  Cardiovascular: Regular rhythm and intact distal pulses.  Slightly tachycardic  Pulmonary/Chest: She has wheezes.  Appears comfortable on BiPAP  Abdominal: Soft. Bowel sounds are normal. She exhibits no distension. There is no abdominal tenderness. There is no guarding.  Musculoskeletal:        General: No edema.  Neurological: She is alert and oriented to person, place, and time.  Skin: Skin is warm and dry. She is not diaphoretic.     Labs on Admission: I have personally reviewed following labs and imaging studies  CBC: Recent Labs  Lab 02/14/19 0132 02/17/19 0321 02/20/19 0312 02/20/19 0335 02/20/19 0410  WBC 21.5* 22.6* 29.1*  --   --   NEUTROABS 16.9* 14.8* 18.6*  --   --   HGB 13.0 12.2 11.6* 12.6 12.2  HCT 41.4 38.2 37.3 37.0 36.0  MCV 88.8 86.2 87.1  --   --   PLT 484* 431* 399  --   --    Basic Metabolic Panel: Recent Labs  Lab 02/14/19 0132 02/17/19 0321 02/20/19 0312 02/20/19 0335 02/20/19 0410  NA 136 138 136 135 135  K 4.0 3.6 3.9 4.0 3.6  CL 103 104 100  --   --   CO2 21* 23 22  --   --   GLUCOSE 161* 105* 170*  --   --   BUN 11 11 10   --    --  CREATININE 0.81 0.79 0.73  --   --   CALCIUM 9.0 8.5* 8.4*  --   --    GFR: Estimated Creatinine Clearance: 135.7 mL/min (by C-G formula based on SCr of 0.73 mg/dL). Liver Function Tests: Recent Labs  Lab 02/17/19 0321  AST 19  ALT 24  ALKPHOS 69  BILITOT 0.3  PROT 6.5  ALBUMIN 3.1*   No results for input(s): LIPASE, AMYLASE in the last 168 hours. No results for input(s): AMMONIA in the last 168 hours. Coagulation Profile: No results for input(s): INR, PROTIME in the last 168 hours. Cardiac Enzymes: No results for input(s): CKTOTAL, CKMB, CKMBINDEX, TROPONINI in the last 168 hours. BNP (last 3 results) No results for input(s): PROBNP in the last 8760 hours. HbA1C: No results for input(s): HGBA1C in the last 72 hours. CBG: No results for input(s): GLUCAP in the last 168 hours. Lipid Profile: No results for input(s): CHOL, HDL, LDLCALC, TRIG, CHOLHDL, LDLDIRECT in the last 72 hours. Thyroid Function Tests: No results for input(s): TSH, T4TOTAL, FREET4, T3FREE, THYROIDAB in the last 72 hours. Anemia Panel: No results for input(s): VITAMINB12, FOLATE, FERRITIN, TIBC, IRON, RETICCTPCT in the last 72 hours. Urine analysis:    Component Value Date/Time   COLORURINE YELLOW 10/13/2017 1100   APPEARANCEUR CLEAR 10/13/2017 1100   LABSPEC 1.005 10/13/2017 1100   PHURINE 5.0 10/13/2017 1100   GLUCOSEU NEGATIVE 10/13/2017 1100   GLUCOSEU NEG mg/dL 10/28/2007 2049   HGBUR SMALL (A) 10/13/2017 1100   BILIRUBINUR NEGATIVE 10/13/2017 1100   KETONESUR NEGATIVE 10/13/2017 1100   PROTEINUR NEGATIVE 10/13/2017 1100   UROBILINOGEN 1.0 11/21/2014 0707   NITRITE NEGATIVE 10/13/2017 1100   LEUKOCYTESUR SMALL (A) 10/13/2017 1100    Radiological Exams on Admission: Dg Chest Portable 1 View  Result Date: 02/20/2019 CLINICAL DATA:  48 year old female with shortness of breath. EXAM: PORTABLE CHEST 1 VIEW COMPARISON:  Chest radiograph dated 02/17/2019 FINDINGS: Mild cardiomegaly with  vascular congestion, progressed since the prior radiograph. Pneumonia is not excluded. Clinical correlation is recommended. No focal consolidation, pleural effusion, pneumothorax. No acute osseous pathology. IMPRESSION: Mild cardiomegaly with vascular congestion. Pneumonia is not excluded. No focal consolidation. Electronically Signed   By: Anner Crete M.D.   On: 02/20/2019 04:06    EKG: Independently reviewed.  Sinus tachycardia, PVCs.  Assessment/Plan Principal Problem:   Acute asthma exacerbation Active Problems:   HTN (hypertension)   AF (paroxysmal atrial fibrillation) ([facility])   Acute respiratory failure ([facility])   CHF exacerbation ([facility])   Acute respiratory failure secondary to acute asthma exacerbation and acute exacerbation of chronic diastolic congestive heart failure, ? Pneumonia Wheezing with significantly increased work of breathing.  Blood gas reassuring.  Currently on BiPAP. Chest x-ray showing mild cardiomegaly with vascular congestion, pneumonia not excluded.  No focal consolidation.  BNP likely falsely normal given morbid obesity.  Echo done in January 2020 with grade 2 diastolic dysfunction.  SARS-CoV-2 test negative.   White blood cell count 29.1, previously elevated as well in the setting of steroid use.  -Received IV Solu-Medrol 125 mg by EMS.  Continue IV Solu-Medrol 60 mg every 8 hours. DuoNebs every 6 hours.  Pulmicort neb twice daily.  Albuterol nebulizer as needed. -Received ceftriaxone and azithromycin in the ED.  Check procalcitonin level.  Blood culture x2 pending. -IV Lasix 40 mg twice daily. Monitor intake and output, daily weights, low-sodium diet with fluid restriction.  Repeat echocardiogram. -Seems comfortable on BiPAP, continue at this time -Continuous pulse ox  Hypertension Blood  pressure elevated with systolic in the [id number]. -Lasix for diuresis as above -Hydralazine PRN SBP >160  Paroxysmal atrial fibrillation -Currently in sinus rhythm.  Continue home  metoprolol and Eliquis.  DVT prophylaxis: Lovenox Code Status: Full code Family Communication: No family available. Disposition Plan: Anticipate discharge after clinical improvement. Consults called: None Admission status: It is my clinical opinion that admission to INPATIENT is reasonable and necessary in this 48 y.o. female . presenting with acute respiratory failure secondary to acute asthma exacerbation and acute exacerbation of chronic diastolic congestive heart failure.  Currently on BiPAP.  High risk of decompensation.  Given the aforementioned, the predictability of an adverse outcome is felt to be significant. I expect that the patient will require at least 2 midnights in the hospital to treat this condition.   The medical decision making on this patient was of high complexity and the patient is at high risk for clinical deterioration, therefore this is a level 3 visit.  Shela Leff MD [facility] Pager [phone]  If 7PM-7AM, please contact night-coverage [email or web address] Password [email or web address]  02/20/2019, 7:51 AM

## 2019-02-23 LAB — PATHOLOGIST SMEAR REVIEW

## 2019-02-25 LAB — CULTURE, BLOOD (ROUTINE X 2)
Culture: NO GROWTH
Culture: NO GROWTH
Special Requests: ADEQUATE

## 2019-02-28 ENCOUNTER — Other Ambulatory Visit: Payer: Self-pay | Admitting: Family Medicine

## 2019-02-28 DIAGNOSIS — K219 Gastro-esophageal reflux disease without esophagitis: Secondary | ICD-10-CM

## 2019-03-04 ENCOUNTER — Ambulatory Visit: Payer: Medicare Other

## 2019-03-04 ENCOUNTER — Other Ambulatory Visit: Payer: Self-pay

## 2019-03-04 ENCOUNTER — Ambulatory Visit (INDEPENDENT_AMBULATORY_CARE_PROVIDER_SITE_OTHER): Payer: Medicare Other

## 2019-03-04 DIAGNOSIS — J4541 Moderate persistent asthma with (acute) exacerbation: Secondary | ICD-10-CM | POA: Diagnosis not present

## 2019-03-04 MED ORDER — DUPILUMAB 300 MG/2ML ~~LOC~~ SOSY
300.0000 mg | PREFILLED_SYRINGE | Freq: Once | SUBCUTANEOUS | Status: AC
  Administered 2019-03-04: 300 mg via SUBCUTANEOUS

## 2019-03-04 NOTE — Progress Notes (Signed)
All questions were answered by the patient before medication was administered. Have you been hospitalized in the last 10 days? No Do you have a fever? No Do you have a cough? No Do you have a headache or sore throat? No  

## 2019-03-05 ENCOUNTER — Telehealth: Payer: Self-pay | Admitting: Family Medicine

## 2019-03-05 NOTE — Telephone Encounter (Signed)
I made the appointment for November 25 she want to talk with you

## 2019-03-08 ENCOUNTER — Telehealth: Payer: Self-pay | Admitting: Internal Medicine

## 2019-03-08 NOTE — Telephone Encounter (Signed)
Attempted to contact the patient # [phone], message left with call back requested # [phone]

## 2019-03-08 NOTE — Telephone Encounter (Signed)
Called [facility] to set up shipment. Was advised that it was too early to be set up. Will call back later this week to set up shipment.

## 2019-03-12 NOTE — Telephone Encounter (Signed)
Dupixent Order: 300mg  #2 prefilled syringe Ordered Date: 03/12/2019 Expected date of arrival: 03/16/2019 Ordered by: Desmond Dike, [place]  Specialty Pharmacy: [facility]

## 2019-03-16 NOTE — Telephone Encounter (Signed)
Dupixent Shipment Received: 300mg  #2 prefilled syringe Medication arrival date: 03/16/19 Lot #: [id number] Exp date: 05/30/2021 Received by: Elliot Dally

## 2019-03-17 ENCOUNTER — Telehealth: Payer: Self-pay | Admitting: Internal Medicine

## 2019-03-17 NOTE — Telephone Encounter (Signed)
Spoke with patient. She stated that she was returning a call for the covid screening questions for her injection appointment tomorrow. Went over the questions with patient, screening was negative. She is aware that she is ok to come in for her injection.   Nothing further needed at time of call.

## 2019-03-18 ENCOUNTER — Ambulatory Visit (INDEPENDENT_AMBULATORY_CARE_PROVIDER_SITE_OTHER): Payer: Medicare Other

## 2019-03-18 ENCOUNTER — Other Ambulatory Visit: Payer: Self-pay

## 2019-03-18 DIAGNOSIS — J4541 Moderate persistent asthma with (acute) exacerbation: Secondary | ICD-10-CM | POA: Diagnosis not present

## 2019-03-18 MED ORDER — DUPILUMAB 300 MG/2ML ~~LOC~~ SOSY
300.0000 mg | PREFILLED_SYRINGE | Freq: Once | SUBCUTANEOUS | Status: AC
  Administered 2019-03-18: 300 mg via SUBCUTANEOUS

## 2019-03-18 NOTE — Progress Notes (Signed)
Have you been hospitalized within the last 10 days?  No Do you have a fever?  No Do you have a cough?  No Do you have a headache or sore throat? No Do you have your Epi Pen visible and is it within date?  Yes 

## 2019-03-24 ENCOUNTER — Other Ambulatory Visit: Payer: Self-pay

## 2019-03-24 ENCOUNTER — Encounter: Payer: Self-pay | Admitting: Family Medicine

## 2019-03-24 ENCOUNTER — Ambulatory Visit: Payer: Medicare Other | Attending: Family Medicine | Admitting: Family Medicine

## 2019-03-24 VITALS — BP 108/68 | HR 81 | Temp 98.0°F | Ht 65.0 in | Wt 348.0 lb

## 2019-03-24 DIAGNOSIS — G629 Polyneuropathy, unspecified: Secondary | ICD-10-CM | POA: Diagnosis not present

## 2019-03-24 DIAGNOSIS — R7303 Prediabetes: Secondary | ICD-10-CM

## 2019-03-24 DIAGNOSIS — J4541 Moderate persistent asthma with (acute) exacerbation: Secondary | ICD-10-CM

## 2019-03-24 DIAGNOSIS — F419 Anxiety disorder, unspecified: Secondary | ICD-10-CM

## 2019-03-24 DIAGNOSIS — I10 Essential (primary) hypertension: Secondary | ICD-10-CM

## 2019-03-24 DIAGNOSIS — I480 Paroxysmal atrial fibrillation: Secondary | ICD-10-CM | POA: Diagnosis not present

## 2019-03-24 DIAGNOSIS — F32A Depression, unspecified: Secondary | ICD-10-CM

## 2019-03-24 DIAGNOSIS — F329 Major depressive disorder, single episode, unspecified: Secondary | ICD-10-CM

## 2019-03-24 DIAGNOSIS — K648 Other hemorrhoids: Secondary | ICD-10-CM | POA: Diagnosis not present

## 2019-03-24 DIAGNOSIS — G4709 Other insomnia: Secondary | ICD-10-CM

## 2019-03-24 DIAGNOSIS — R232 Flushing: Secondary | ICD-10-CM

## 2019-03-24 LAB — POCT GLYCOSYLATED HEMOGLOBIN (HGB A1C): HbA1c, POC (prediabetic range): 5.6 % — AB (ref 5.7–6.4)

## 2019-03-24 MED ORDER — DULOXETINE HCL 60 MG PO CPEP: 60.0000 mg | ORAL_CAPSULE | Freq: Every day | ORAL | 1 refills | Status: DC

## 2019-03-24 MED ORDER — KETOROLAC TROMETHAMINE 60 MG/2ML IM SOLN
60.0000 mg | Freq: Once | INTRAMUSCULAR | Status: AC
  Administered 2019-03-24: 60 mg via INTRAMUSCULAR

## 2019-03-24 MED ORDER — METOPROLOL TARTRATE 25 MG PO TABS: 25.0000 mg | ORAL_TABLET | Freq: Two times a day (BID) | ORAL | 3 refills | Status: DC

## 2019-03-24 MED ORDER — ZOLPIDEM TARTRATE 5 MG PO TABS: 5.0000 mg | ORAL_TABLET | Freq: Every evening | ORAL | 2 refills | Status: DC | PRN

## 2019-03-24 MED ORDER — HYDROCORT-PRAMOXINE (PERIANAL) 2.5-1 % EX CREA: 1.0000 "application " | TOPICAL_CREAM | Freq: Three times a day (TID) | CUTANEOUS | 1 refills | Status: DC

## 2019-03-24 MED ORDER — ALBUTEROL SULFATE HFA 108 (90 BASE) MCG/ACT IN AERS: 2.0000 | INHALATION_SPRAY | RESPIRATORY_TRACT | 3 refills | Status: DC | PRN

## 2019-03-24 MED ORDER — CLONIDINE HCL 0.2 MG PO TABS: 0.2000 mg | ORAL_TABLET | Freq: Every evening | ORAL | 3 refills | Status: DC | PRN

## 2019-03-24 MED ORDER — DULERA 200-5 MCG/ACT IN AERO: 2.0000 | INHALATION_SPRAY | Freq: Two times a day (BID) | RESPIRATORY_TRACT | 3 refills | Status: DC

## 2019-03-24 MED ORDER — ALBUTEROL SULFATE (2.5 MG/3ML) 0.083% IN NEBU: 2.5000 mg | INHALATION_SOLUTION | Freq: Four times a day (QID) | RESPIRATORY_TRACT | 3 refills | Status: DC | PRN

## 2019-03-24 MED ORDER — MONTELUKAST SODIUM 10 MG PO TABS: 10.0000 mg | ORAL_TABLET | Freq: Every day | ORAL | 3 refills | Status: DC

## 2019-03-24 NOTE — Patient Instructions (Signed)
Perimenopause  Perimenopause is the normal time of life before and after menstrual periods stop completely (menopause). Perimenopause can begin 2-8 years before menopause, and it usually lasts for 1 year after menopause. During perimenopause, the ovaries may or may not produce an egg. What are the causes? This condition is caused by a natural change in hormone levels that happens as you get older. What increases the risk? This condition is more likely to start at an earlier age if you have certain medical conditions or treatments, including:  A tumor of the pituitary gland in the brain.  A disease that affects the ovaries and hormone production.  Radiation treatment for cancer.  Certain cancer treatments, such as chemotherapy or hormone (anti-estrogen) therapy.  Heavy smoking and excessive alcohol use.  Family history of early menopause. What are the signs or symptoms? Perimenopausal changes affect each woman differently. Symptoms of this condition may include:  Hot flashes.  Night sweats.  Irregular menstrual periods.  Decreased sex drive.  Vaginal dryness.  Headaches.  Mood swings.  Depression.  Memory problems or trouble concentrating.  Irritability.  Tiredness.  Weight gain.  Anxiety.  Trouble getting pregnant. How is this diagnosed? This condition is diagnosed based on your medical history, a physical exam, your age, your menstrual history, and your symptoms. Hormone tests may also be done. How is this treated? In some cases, no treatment is needed. You and your health care provider should make a decision together about whether treatment is necessary. Treatment will be based on your individual condition and preferences. Various treatments are available, such as:  Menopausal hormone therapy (MHT).  Medicines to treat specific symptoms.  Acupuncture.  Vitamin or herbal supplements. Before starting treatment, make sure to let your health care provider  know if you have a personal or family history of:  Heart disease.  Breast cancer.  Blood clots.  Diabetes.  Osteoporosis. Follow these instructions at home: Lifestyle  Do not use any products that contain nicotine or tobacco, such as cigarettes and e-cigarettes. If you need help quitting, ask your health care provider.  Eat a balanced diet that includes fresh fruits and vegetables, whole grains, soybeans, eggs, lean meat, and low-fat dairy.  Get at least 30 minutes of physical activity on 5 or more days each week.  Avoid alcoholic and caffeinated beverages, as well as spicy foods. This may help prevent hot flashes.  Get 7-8 hours of sleep each night.  Dress in layers that can be removed to help you manage hot flashes.  Find ways to manage stress, such as deep breathing, meditation, or journaling. General instructions  Keep track of your menstrual periods, including: ? When they occur. ? How heavy they are and how long they last. ? How much time passes between periods.  Keep track of your symptoms, noting when they start, how often you have them, and how long they last.  Take over-the-counter and prescription medicines only as told by your health care provider.  Take vitamin supplements only as told by your health care provider. These may include calcium, vitamin E, and vitamin D.  Use vaginal lubricants or moisturizers to help with vaginal dryness and improve comfort during sex.  Talk with your health care provider before starting any herbal supplements.  Keep all follow-up visits as told by your health care provider. This is important. This includes any group therapy or counseling. Contact a health care provider if:  You have heavy vaginal bleeding or pass blood clots.  Your period   lasts more than 2 days longer than normal.  Your periods are recurring sooner than 21 days.  You bleed after having sex. Get help right away if:  You have chest pain, trouble  breathing, or trouble talking.  You have severe depression.  You have pain when you urinate.  You have severe headaches.  You have vision problems. Summary  Perimenopause is the time when a woman's body begins to move into menopause. This may happen naturally or as a result of other health problems or medical treatments.  Perimenopause can begin 2-8 years before menopause, and it usually lasts for 1 year after menopause.  Perimenopausal symptoms can be managed through medicines, lifestyle changes, and complementary therapies such as acupuncture. This information is not intended to replace advice given to you by your health care provider. Make sure you discuss any questions you have with your health care provider. Document Released: 05/23/2004 Document Revised: 03/28/2017 Document Reviewed: 05/21/2016 Elsevier Patient Education  2020 [employer]  

## 2019-03-24 NOTE — Progress Notes (Signed)
Patient states that she has Hemorrhoids.

## 2019-03-24 NOTE — Progress Notes (Signed)
Subjective:  Patient ID: April [name], female    DOB: 01-07-1971  Age: 48 y.o. MRN: [id number]  CC: Asthma   HPI April [name] is a 48 year old female with a history of morbid obesity, major depressive disorder, hypertension, COPD/ Asthma , obstructive sleep apnea (on CPAP) with multiple hospitalizations for asthma exacerbation/COPD , steroid-induced diabetes  (previously on metformin seen for follow-up visit.  She had an ED visit for asthma exacerbation one moth ago. CXR revealed: IMPRESSION: Mild cardiomegaly with vascular congestion. Pneumonia is not excluded. No focal consolidation  She was placed on BiPAP and her condition improved;; also received IV Ceftriaxone and Azithromycin. COVID-19 test was negative.IV Solumedrol had been administered by EMS en route. Placed on Prednisone and subsequently discharged - notes indicate she left AMA.  She does have a chronic wheeze but states she feels at her baseline; yet to see her Pulmonologist. She is concerned about insomnia which is uncontrolled on Ambient as she slept for just one hr yesterday. Her hot flashes are uncontrolled on Clonidine.  She endorses lack of motivation,, sadness but no suicidal ideations or intents. Currently on Cymbalta. Compliant with Eliquis. She sees pain management of r her chronic knee pain and is also receiving vyvanse to help with weight loss.She was referred to Bariatric surgery but could not follow through due to repeated hospitalizations.  Past Medical History:  Diagnosis Date   Acanthosis nigricans    Anxiety    Arthritis    "knees" (04/28/2017)   Asthma    Followed by Dr. Annamaria Boots (pulmonology); receives every other week omalizumab injections; has frequent exacerbations   Chronic diastolic CHF (congestive heart failure) ([place]) 01/17/2017   COPD (chronic obstructive pulmonary disease) ([place])    PFTs in 2002, FEV1/FVC 65, no post bronchodilater test done   Depression    GERD (gastroesophageal reflux  disease)    Headache(784.0)    "q couple days" (123456)   Helicobacter pylori (H. pylori) infection    Hypertension, essential    Insomnia    Menorrhagia    Morbid obesity ([place])    OSA on CPAP    Sleep study 2008 - mild OSA, not enough events to titrate CPAP; wears CPAP now/pt on 04/28/2017   Pneumonia X 1   Seasonal allergies    Shortness of breath    Tobacco user     Past Surgical History:  Procedure Laterality Date   CARDIOVERSION N/A 05/30/2017   Procedure: CARDIOVERSION;  Surgeon: Sanda Klein, MD;  Location: [facility];  Service: Cardiovascular;  Laterality: N/A;   REDUCTION MAMMAPLASTY Bilateral 09/2011   TUBAL LIGATION  1996   bilateral    Family History  Problem Relation Age of Onset   Hypertension Mother    Asthma Daughter    Cancer Paternal Aunt    Asthma Maternal Grandmother     Allergies  Allergen Reactions   Contrast Media [Iodinated Diagnostic Agents] Itching    Ct contrast    Outpatient Medications Prior to Visit  Medication Sig Dispense Refill   apixaban (ELIQUIS) 5 MG TABS tablet Take 1 tablet (5 mg total) by mouth 2 (two) times daily. 60 tablet 3   cyclobenzaprine (FLEXERIL) 10 MG tablet Take 10 mg by mouth 3 (three) times daily as needed for muscle spasms.     dupilumab (DUPIXENT) 300 MG/2ML prefilled syringe Inject 300 mg into the skin every 14 (fourteen) days. 2 mL 11   EPINEPHrine 0.3 mg/0.3 mL IJ SOAJ injection Inject 0.3 mLs (0.3 mg total) into  the muscle as needed for anaphylaxis. 1 each 1   Ferrous Sulfate (IRON) 325 (65 Fe) MG TABS Take 1 tablet (325 mg total) by mouth every morning. (Patient taking differently: Take 1 tablet by mouth daily. ) 30 each 0   fluticasone (FLONASE) 50 MCG/ACT nasal spray Place 1 spray into both nostrils daily. 30 g 2   furosemide (LASIX) 40 MG tablet TAKE 1 TABLET(40 MG) BY MOUTH DAILY (Patient taking differently: Take 40 mg by mouth daily. ) 90 tablet 0   gabapentin (NEURONTIN)  600 MG tablet Take 1,200 mg by mouth 3 (three) times daily.     hydrALAZINE (APRESOLINE) 10 MG tablet Take 1 tablet (10 mg total) by mouth every 8 (eight) hours for 30 days. 90 tablet 1   ipratropium (ATROVENT) 0.02 % nebulizer solution Take 2.5 mLs (0.5 mg total) by nebulization 4 (four) times daily. 75 mL 12   oxyCODONE-acetaminophen (PERCOCET) 10-325 MG tablet Take 1 tablet by mouth 5 (five) times daily as needed for pain.     OZEMPIC, 1 MG/DOSE, 2 MG/1.5ML SOPN Inject 1 mg into the skin every Wednesday.      phentermine 15 MG capsule Take 15 mg by mouth daily.     potassium chloride SA (KLOR-CON) 20 MEQ tablet Take 1 tablet (20 mEq total) by mouth daily. 30 tablet 3   predniSONE (DELTASONE) 20 MG tablet Take 3 tablets by mouth daily x1 day; 2 tablets by mouth daily x2 days; then 1 tablet by mouth daily x3 days; then half tablet by mouth daily x3 days and stop prednisone. (Patient taking differently: Take 10-60 mg by mouth See admin instructions. Take 3 tablets by mouth daily x1 day; 2 tablets by mouth daily x2 days; then 1 tablet by mouth daily x3 days; then half tablet by mouth daily x3 days and stop prednisone.) 12 tablet 0   pregabalin (LYRICA) 75 MG capsule Take 75 mg by mouth 2 (two) times daily.     Vitamin D, Ergocalciferol, (DRISDOL) 1.25 MG (50000 UT) CAPS capsule TAKE 1 CAPSULE BY MOUTH 1 TIME A WEEK (Patient taking differently: Take 50,000 Units by mouth every Wednesday. ) 9 capsule 1   VYVANSE 70 MG capsule Take 70 mg by mouth daily.     albuterol (PROVENTIL) (2.5 MG/3ML) 0.083% nebulizer solution USE 1 VIAL VIA NEBULIZER EVERY 6 HOURS AS NEEDED FOR WHEEZING OR SHORTNESS OF BREATH (Patient taking differently: Take 2.5 mg by nebulization every 6 (six) hours as needed for wheezing or shortness of breath. ) 75 mL 1   albuterol (VENTOLIN HFA) 108 (90 Base) MCG/ACT inhaler Inhale 2 puffs into the lungs every 4 (four) hours as needed for wheezing or shortness of breath. 18 g 3    cloNIDine (CATAPRES) 0.1 MG tablet Take 1 tablet (0.1 mg total) by mouth at bedtime as needed. For hot flashes 30 tablet 1   DULoxetine (CYMBALTA) 60 MG capsule TAKE 1 CAPSULE(60 MG) BY MOUTH DAILY (Patient taking differently: Take 60 mg by mouth daily. ) 90 capsule 0   metoprolol tartrate (LOPRESSOR) 50 MG tablet Take 1 tablet (50 mg total) by mouth 2 (two) times daily. 60 tablet 3   mometasone-formoterol (DULERA) 200-5 MCG/ACT AERO Inhale 2 puffs into the lungs 2 (two) times daily. 8.8 g 1   montelukast (SINGULAIR) 10 MG tablet Take 1 tablet (10 mg total) by mouth at bedtime. 30 tablet 3   zolpidem (AMBIEN) 5 MG tablet Take 1 tablet (5 mg total) by mouth at bedtime as  needed for sleep.     azithromycin (ZITHROMAX) 250 MG tablet Take 1 tablet (250 mg total) by mouth daily. Take first 2 tablets together, then 1 every day until finished. (Patient not taking: Reported on 03/24/2019) 6 tablet 0   HYDROcodone-homatropine (HYCODAN) 5-1.5 MG/5ML syrup Take 5 mLs by mouth every 6 (six) hours as needed for cough. (Patient not taking: Reported on 03/24/2019) 120 mL 0   nicotine (NICODERM CQ - DOSED IN MG/24 HOURS) 21 mg/24hr patch Place 1 patch (21 mg total) onto the skin daily. (Patient not taking: Reported on 03/24/2019) 28 patch 1   omeprazole (PRILOSEC) 20 MG capsule Take 1 capsule (20 mg total) by mouth 2 (two) times daily before a meal. (Patient not taking: Reported on 02/20/2019) 60 capsule 2   No facility-administered medications prior to visit.      ROS Review of Systems  Constitutional: Negative for activity change, appetite change and fatigue.  HENT: Negative for congestion, sinus pressure and sore throat.   Eyes: Negative for visual disturbance.  Respiratory: Positive for wheezing. Negative for cough, chest tightness and shortness of breath.   Cardiovascular: Negative for chest pain and palpitations.  Gastrointestinal: Negative for abdominal distention, abdominal pain and  constipation.  Endocrine: Negative for polydipsia.  Genitourinary: Negative for dysuria and frequency.  Musculoskeletal:       See HPI  Skin: Negative for rash.  Neurological: Negative for tremors, light-headedness and numbness.  Hematological: Does not bruise/bleed easily.  Psychiatric/Behavioral: Negative for agitation and behavioral problems.    Objective:  BP 108/68    Pulse 81    Temp 98 F (36.7 C) (Oral)    Ht 5\' 5"  (1.651 m)    Wt (!) 348 lb (157.9 kg)    SpO2 98%    BMI 57.91 kg/m   BP/Weight 03/24/2019 02/20/2019 [id number]  Systolic BP [id number] 0000000 [id number]  Diastolic BP 68 89 [id number]  Wt. (Lbs) 348 - -  BMI 57.91 - -  Some encounter information is confidential and restricted. Go to Review Flowsheets activity to see all data.      Physical Exam Constitutional:      Appearance: She is well-developed. She is obese.  Neck:     Vascular: No JVD.  Cardiovascular:     Rate and Rhythm: Normal rate.     Heart sounds: Normal heart sounds. No murmur.  Pulmonary:     Effort: Pulmonary effort is normal.     Breath sounds: Wheezing present. No rales.  Chest:     Chest wall: No tenderness.  Abdominal:     General: Bowel sounds are normal. There is no distension.     Palpations: Abdomen is soft. There is no mass.     Tenderness: There is no abdominal tenderness.  Musculoskeletal: Normal range of motion.        General: Tenderness (and crepitus on ROM of both knees) present.     Right lower leg: No edema.     Left lower leg: No edema.  Neurological:     Mental Status: She is alert and oriented to person, place, and time.  Psychiatric:        Mood and Affect: Mood normal.     CMP Latest Ref Rng & Units 02/20/2019 02/20/2019 02/20/2019  Glucose 70 - 99 mg/dL - - 170(H)  BUN 6 - 20 mg/dL - - 10  Creatinine 0.44 - 1.00 mg/dL - - 0.73  Sodium 135 - 145 mmol/L 135 135 136  Potassium 3.5 -  5.1 mmol/L 3.6 4.0 3.9  Chloride 98 - 111 mmol/L - - 100  CO2 22 - 32 mmol/L - - 22  Calcium  8.9 - 10.3 mg/dL - - 8.4(L)  Total Protein 6.5 - 8.1 g/dL - - -  Total Bilirubin 0.3 - 1.2 mg/dL - - -  Alkaline Phos 38 - 126 U/L - - -  AST 15 - 41 U/L - - -  ALT 0 - 44 U/L - - -    Lipid Panel     Component Value Date/Time   CHOL 183 10/06/2009 2113   TRIG 411 (H) 08/14/2017 2000   HDL 47 10/06/2009 2113   CHOLHDL 3.9 Ratio 10/06/2009 2113   VLDL 13 10/06/2009 2113   LDLCALC 123 (H) 10/06/2009 2113    CBC    Component Value Date/Time   WBC 29.1 (H) 02/20/2019 0312   RBC 4.28 02/20/2019 0312   HGB 12.2 02/20/2019 0410   HGB 10.4 (L) 01/22/2018 1403   HCT 36.0 02/20/2019 0410   HCT 33.3 (L) 01/22/2018 1403   PLT 399 02/20/2019 0312   PLT 616 (H) 01/22/2018 1403   MCV 87.1 02/20/2019 0312   MCV 82 01/22/2018 1403   MCH 27.1 02/20/2019 0312   MCHC 31.1 02/20/2019 0312   RDW 18.1 (H) 02/20/2019 0312   RDW 17.1 (H) 01/22/2018 1403   LYMPHSABS 7.6 (H) 02/20/2019 0312   LYMPHSABS 3.3 (H) 01/22/2018 1403   MONOABS 2.2 (H) 02/20/2019 0312   EOSABS 0.1 02/20/2019 0312   EOSABS 0.3 01/22/2018 1403   BASOSABS 0.1 02/20/2019 0312   BASOSABS 0.1 01/22/2018 1403    Lab Results  Component Value Date   HGBA1C 5.6 (A) 03/24/2019    Assessment & Plan:   1. AF (paroxysmal atrial fibrillation) (HCC) Currently in sinus rhythm On anticoagulation with Eliquis  2. Neuropathy Stable - DULoxetine (CYMBALTA) 60 MG capsule; Take 1 capsule (60 mg total) by mouth daily. Dx: Depression  Dispense: 90 capsule; Refill: 1 - ketorolac (TORADOL) injection 60 mg  3. Moderate persistent asthma with acute exacerbation She does have a chronic wheeze No evidence of acute exacerbation Continue current regimen including Dupixient Keep appointment with Pulmonary - albuterol (PROVENTIL) (2.5 MG/3ML) 0.083% nebulizer solution; Take 3 mLs (2.5 mg total) by nebulization every 6 (six) hours as needed for wheezing or shortness of breath. Dx: Asthma  Dispense: 75 mL; Refill: 3 - albuterol (VENTOLIN  HFA) 108 (90 Base) MCG/ACT inhaler; Inhale 2 puffs into the lungs every 4 (four) hours as needed for wheezing or shortness of breath. Dx: Asthma  Dispense: 18 g; Refill: 3 - mometasone-formoterol (DULERA) 200-5 MCG/ACT AERO; Inhale 2 puffs into the lungs 2 (two) times daily. Dx: Asthma  Dispense: 8.8 g; Refill: 3 - montelukast (SINGULAIR) 10 MG tablet; Take 1 tablet (10 mg total) by mouth at bedtime. Dx: Asthma  Dispense: 30 tablet; Refill: 3  4. Essential hypertension Controlled Decreased Metoprolol dose as Clonidine dose is being increased for better control of hot flashes - metoprolol tartrate (LOPRESSOR) 25 MG tablet; Take 1 tablet (25 mg total) by mouth 2 (two) times daily. Dx: HTN  Dispense: 60 tablet; Refill: 3  5. Hot flashes Uncontrolled Could also explain insomnia Increased Clonidine dose - cloNIDine (CATAPRES) 0.2 MG tablet; Take 1 tablet (0.2 mg total) by mouth at bedtime as needed. Dx; For hot flashes  Dispense: 30 tablet; Refill: 3  6. Other hemorrhoids Use Sitz baths Counseled on preventing contripation - Ambulatory referral to General Surgery -  hydrocortisone-pramoxine (ANALPRAM HC) 2.5-1 % rectal cream; Place 1 application rectally 3 (three) times daily.  Dispense: 30 g; Refill: 1  7. Anxiety and depression Uncontrolled Currently on Cymbalta Will refer for Psychotherapy as underlying medical conditions could be contributory  8. Prediabetes A1c of 5.6 - HgB A1c  9. Other insomnia Uncontrolled She seems to have developed tolerance  Hot flashes could also be contributing Advised I will be unable to increase her dose of Ambien due to underlying respiratory co-morbidities and she might need to discuss with her sleep specialist/ pulmonologist - zolpidem (AMBIEN) 5 MG tablet; Take 1 tablet (5 mg total) by mouth at bedtime as needed for sleep.  Dispense: 30 tablet; Refill: 2    Meds ordered this encounter  Medications   DULoxetine (CYMBALTA) 60 MG capsule    Sig:  Take 1 capsule (60 mg total) by mouth daily. Dx: Depression    Dispense:  90 capsule    Refill:  1   albuterol (PROVENTIL) (2.5 MG/3ML) 0.083% nebulizer solution    Sig: Take 3 mLs (2.5 mg total) by nebulization every 6 (six) hours as needed for wheezing or shortness of breath. Dx: Asthma    Dispense:  75 mL    Refill:  3   albuterol (VENTOLIN HFA) 108 (90 Base) MCG/ACT inhaler    Sig: Inhale 2 puffs into the lungs every 4 (four) hours as needed for wheezing or shortness of breath. Dx: Asthma    Dispense:  18 g    Refill:  3   metoprolol tartrate (LOPRESSOR) 25 MG tablet    Sig: Take 1 tablet (25 mg total) by mouth 2 (two) times daily. Dx: HTN    Dispense:  60 tablet    Refill:  3   cloNIDine (CATAPRES) 0.2 MG tablet    Sig: Take 1 tablet (0.2 mg total) by mouth at bedtime as needed. Dx; For hot flashes    Dispense:  30 tablet    Refill:  3   hydrocortisone-pramoxine (ANALPRAM HC) 2.5-1 % rectal cream    Sig: Place 1 application rectally 3 (three) times daily.    Dispense:  30 g    Refill:  1   mometasone-formoterol (DULERA) 200-5 MCG/ACT AERO    Sig: Inhale 2 puffs into the lungs 2 (two) times daily. Dx: Asthma    Dispense:  8.8 g    Refill:  3   montelukast (SINGULAIR) 10 MG tablet    Sig: Take 1 tablet (10 mg total) by mouth at bedtime. Dx: Asthma    Dispense:  30 tablet    Refill:  3   zolpidem (AMBIEN) 5 MG tablet    Sig: Take 1 tablet (5 mg total) by mouth at bedtime as needed for sleep.    Dispense:  30 tablet    Refill:  2   ketorolac (TORADOL) injection 60 mg    Follow-up: Return for with Jasmine next available- Depression; 3 months PCP- Chronic medical conditions.       [name], MD, FAAFP. [facility], [place]   03/24/2019, 4:20 PM

## 2019-03-28 IMAGING — DX DG CHEST 2V
2 series · 2 of 2 positions shown · non-contrast
Comparison: Chest radiograph performed 10/14/2017

CLINICAL DATA: Acute onset of shortness of breath. High blood
pressure personal history of asthma.

EXAM:
CHEST - 2 VIEW

[x chest ap]
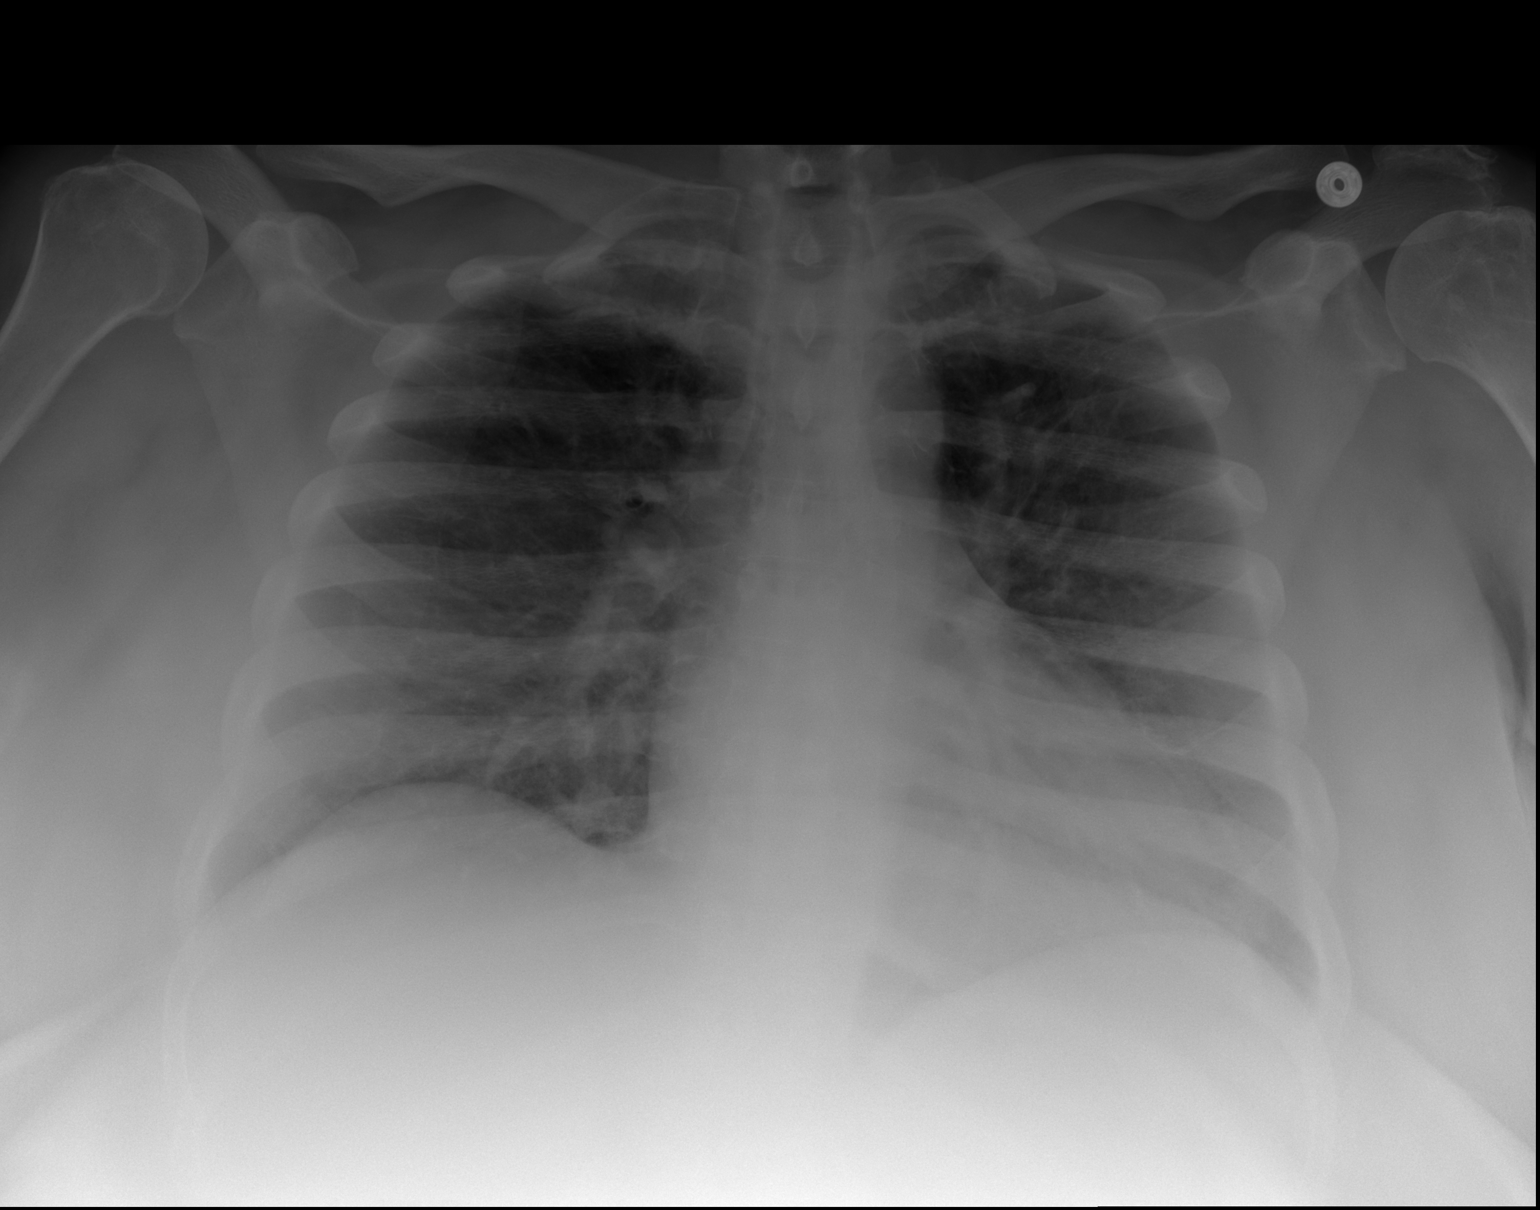

[w chest lat]
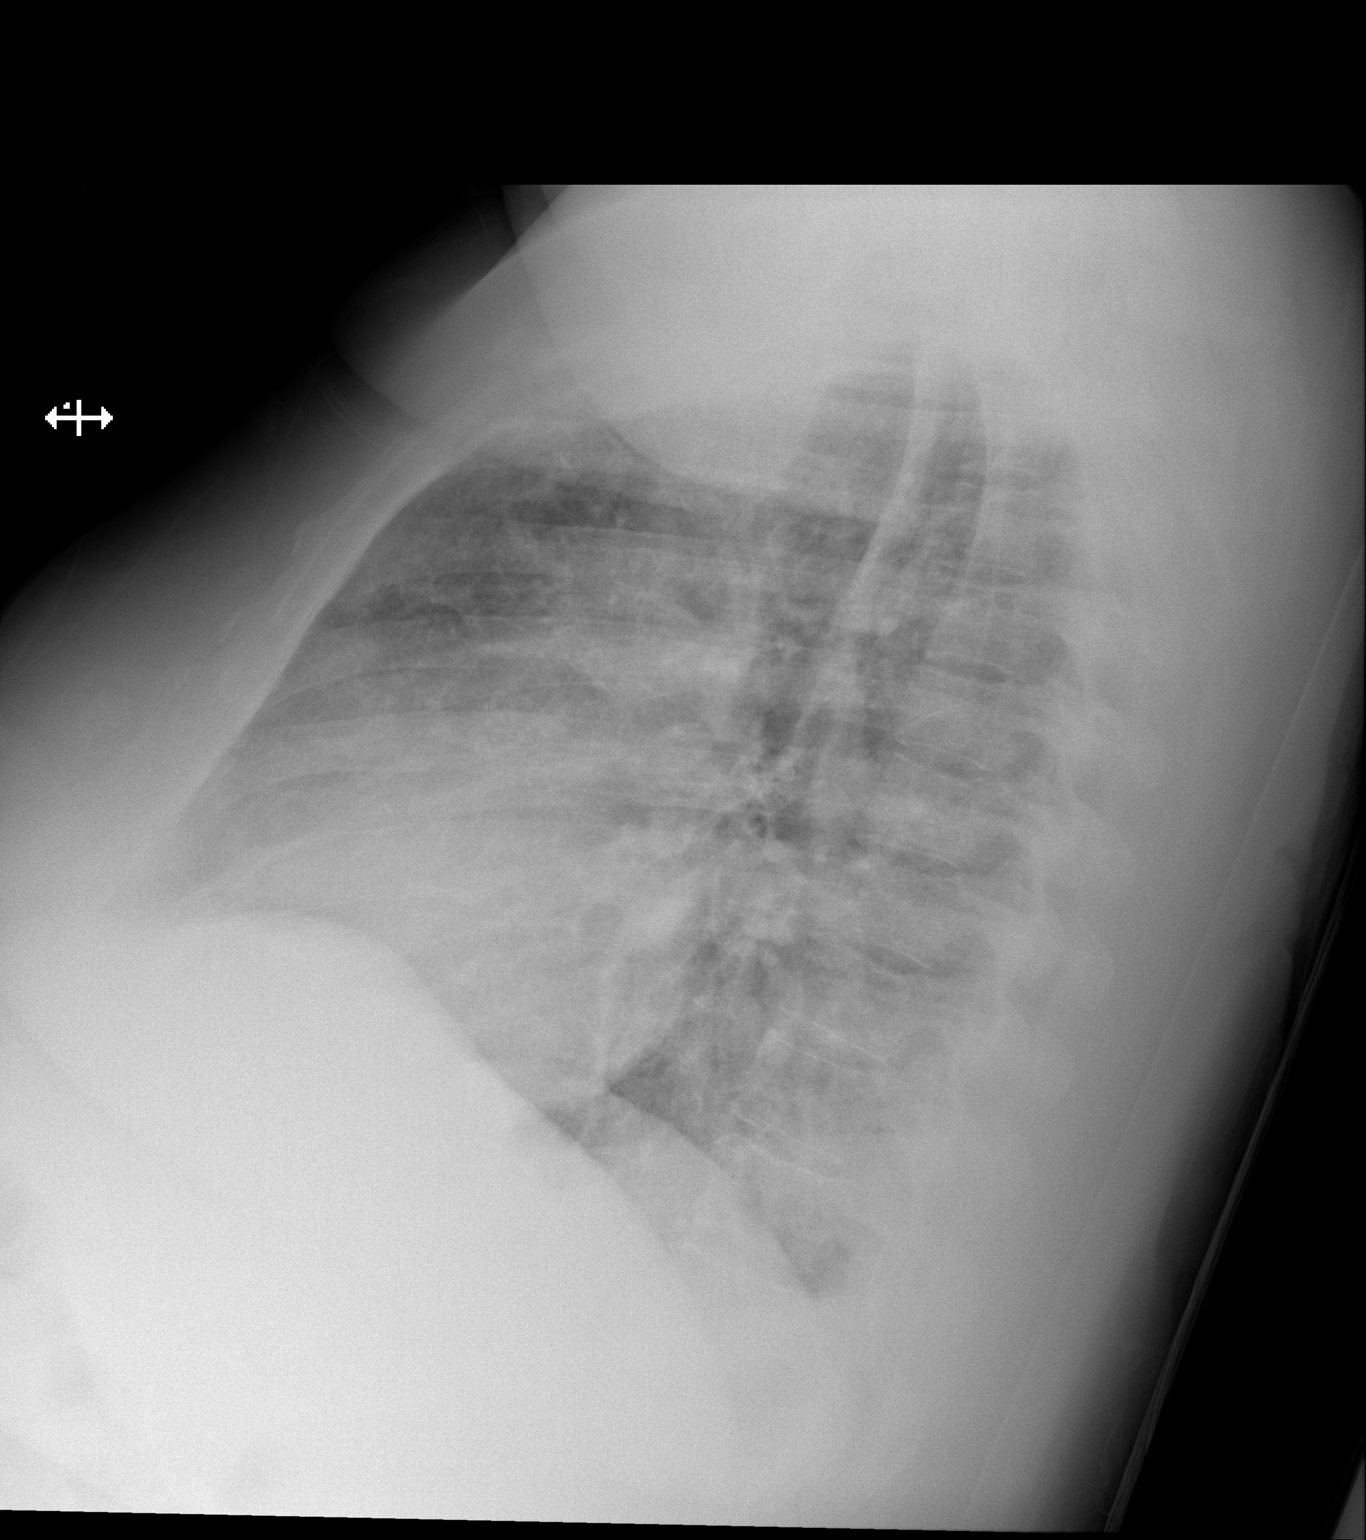

[2 of 2 positions shown; findings below may reference images not displayed]

FINDINGS: The lungs are well-aerated. Mild vascular congestion is noted.
Minimal bibasilar atelectasis is seen. There is no evidence of
pleural effusion or pneumothorax.

The heart is normal in size; the mediastinal contour is within
normal limits. No acute osseous abnormalities are seen.
IMPRESSION: Mild vascular congestion noted. Minimal bibasilar atelectasis seen.

## 2019-04-01 ENCOUNTER — Ambulatory Visit: Payer: Medicare Other

## 2019-04-08 ENCOUNTER — Ambulatory Visit (INDEPENDENT_AMBULATORY_CARE_PROVIDER_SITE_OTHER): Payer: Medicare Other

## 2019-04-08 ENCOUNTER — Other Ambulatory Visit: Payer: Self-pay

## 2019-04-08 DIAGNOSIS — J4541 Moderate persistent asthma with (acute) exacerbation: Secondary | ICD-10-CM

## 2019-04-08 MED ORDER — DUPILUMAB 300 MG/2ML ~~LOC~~ SOSY
300.0000 mg | PREFILLED_SYRINGE | Freq: Once | SUBCUTANEOUS | Status: AC
  Administered 2019-04-08: 300 mg via SUBCUTANEOUS

## 2019-04-08 MED ORDER — ALBUTEROL SULFATE HFA 108 (90 BASE) MCG/ACT IN AERS: 2.0000 | INHALATION_SPRAY | RESPIRATORY_TRACT | 3 refills | Status: DC | PRN

## 2019-04-08 MED ORDER — DULERA 200-5 MCG/ACT IN AERO: 2.0000 | INHALATION_SPRAY | Freq: Two times a day (BID) | RESPIRATORY_TRACT | 3 refills | Status: DC

## 2019-04-08 NOTE — Progress Notes (Signed)
Have you been hospitalized within the last 10 days?  No Do you have a fever?  No Do you have a cough?  No Do you have a headache or sore throat? No Do you have your Epi Pen visible and is it within date?  Yes 

## 2019-04-12 ENCOUNTER — Telehealth: Payer: Self-pay | Admitting: Internal Medicine

## 2019-04-12 NOTE — Telephone Encounter (Signed)
Dupixent Order: 300mg  #2 prefilled syringe Ordered Date: 04/12/2019 Expected date of arrival: 04/16/2019 Ordered by: Desmond Dike, [place]  Specialty Pharmacy: [facility]

## 2019-04-13 ENCOUNTER — Other Ambulatory Visit: Payer: Self-pay | Admitting: Family Medicine

## 2019-04-13 DIAGNOSIS — I480 Paroxysmal atrial fibrillation: Secondary | ICD-10-CM

## 2019-04-13 NOTE — Telephone Encounter (Signed)
Please fill if appropriate to continue

## 2019-04-16 NOTE — Telephone Encounter (Signed)
Dupixent Shipment Received: 300mg  #2 prefilled syringe Medication arrival date: 04/16/19 Lot #: [id number] Exp date: 08/27/2021 Received by: Elliot Dally

## 2019-04-22 ENCOUNTER — Ambulatory Visit: Payer: Medicare Other

## 2019-05-04 ENCOUNTER — Other Ambulatory Visit: Payer: Self-pay | Admitting: Family Medicine

## 2019-05-04 ENCOUNTER — Telehealth: Payer: Self-pay | Admitting: Family Medicine

## 2019-05-04 DIAGNOSIS — G4709 Other insomnia: Secondary | ICD-10-CM

## 2019-05-04 NOTE — Telephone Encounter (Signed)
1) Medication(s) Requested (by name): ° °2) Pharmacy of Choice: ° °3) Special Requests: ° ° °Approved medications will be sent to the pharmacy, we will reach out if there is an issue. ° °Requests made after 3pm may not be addressed until the following business day! ° °If a patient is unsure of the name of the medication(s) please note and ask patient to call back when they are able to provide all info, do not send to responsible party until all information is available! ° °

## 2019-05-04 NOTE — Telephone Encounter (Signed)
1) Medication(s) Requested (by name): Ambien   2) Pharmacy of Choice: [facility]  [place], [place] [id number]   3) Special Requests:   Approved medications will be sent to the pharmacy, we will reach out if there is an issue.  Requests made after 3pm may not be addressed until the following business day!  If a patient is unsure of the name of the medication(s) please note and ask patient to call back when they are able to provide all info, do not send to responsible party until all information is available!

## 2019-05-06 ENCOUNTER — Other Ambulatory Visit: Payer: Self-pay

## 2019-05-06 ENCOUNTER — Ambulatory Visit (INDEPENDENT_AMBULATORY_CARE_PROVIDER_SITE_OTHER): Payer: Medicare Other

## 2019-05-06 DIAGNOSIS — J4541 Moderate persistent asthma with (acute) exacerbation: Secondary | ICD-10-CM

## 2019-05-06 MED ORDER — DUPILUMAB 300 MG/2ML ~~LOC~~ SOSY
300.0000 mg | PREFILLED_SYRINGE | Freq: Once | SUBCUTANEOUS | Status: AC
  Administered 2019-05-06: 300 mg via SUBCUTANEOUS

## 2019-05-06 NOTE — Progress Notes (Signed)
All questions were answered by the patient before medication was administered. Have you been hospitalized in the last 10 days? No Do you have a fever? No Do you have a cough? No Do you have a headache or sore throat? No  

## 2019-05-12 ENCOUNTER — Telehealth: Payer: Self-pay

## 2019-05-12 IMAGING — CR DG CHEST 2V
2 series · 2 of 2 positions shown · non-contrast
Comparison: March 12, 2018

CLINICAL DATA: Chest pain for 1 day

EXAM:
CHEST - 2 VIEW

[chest pa]
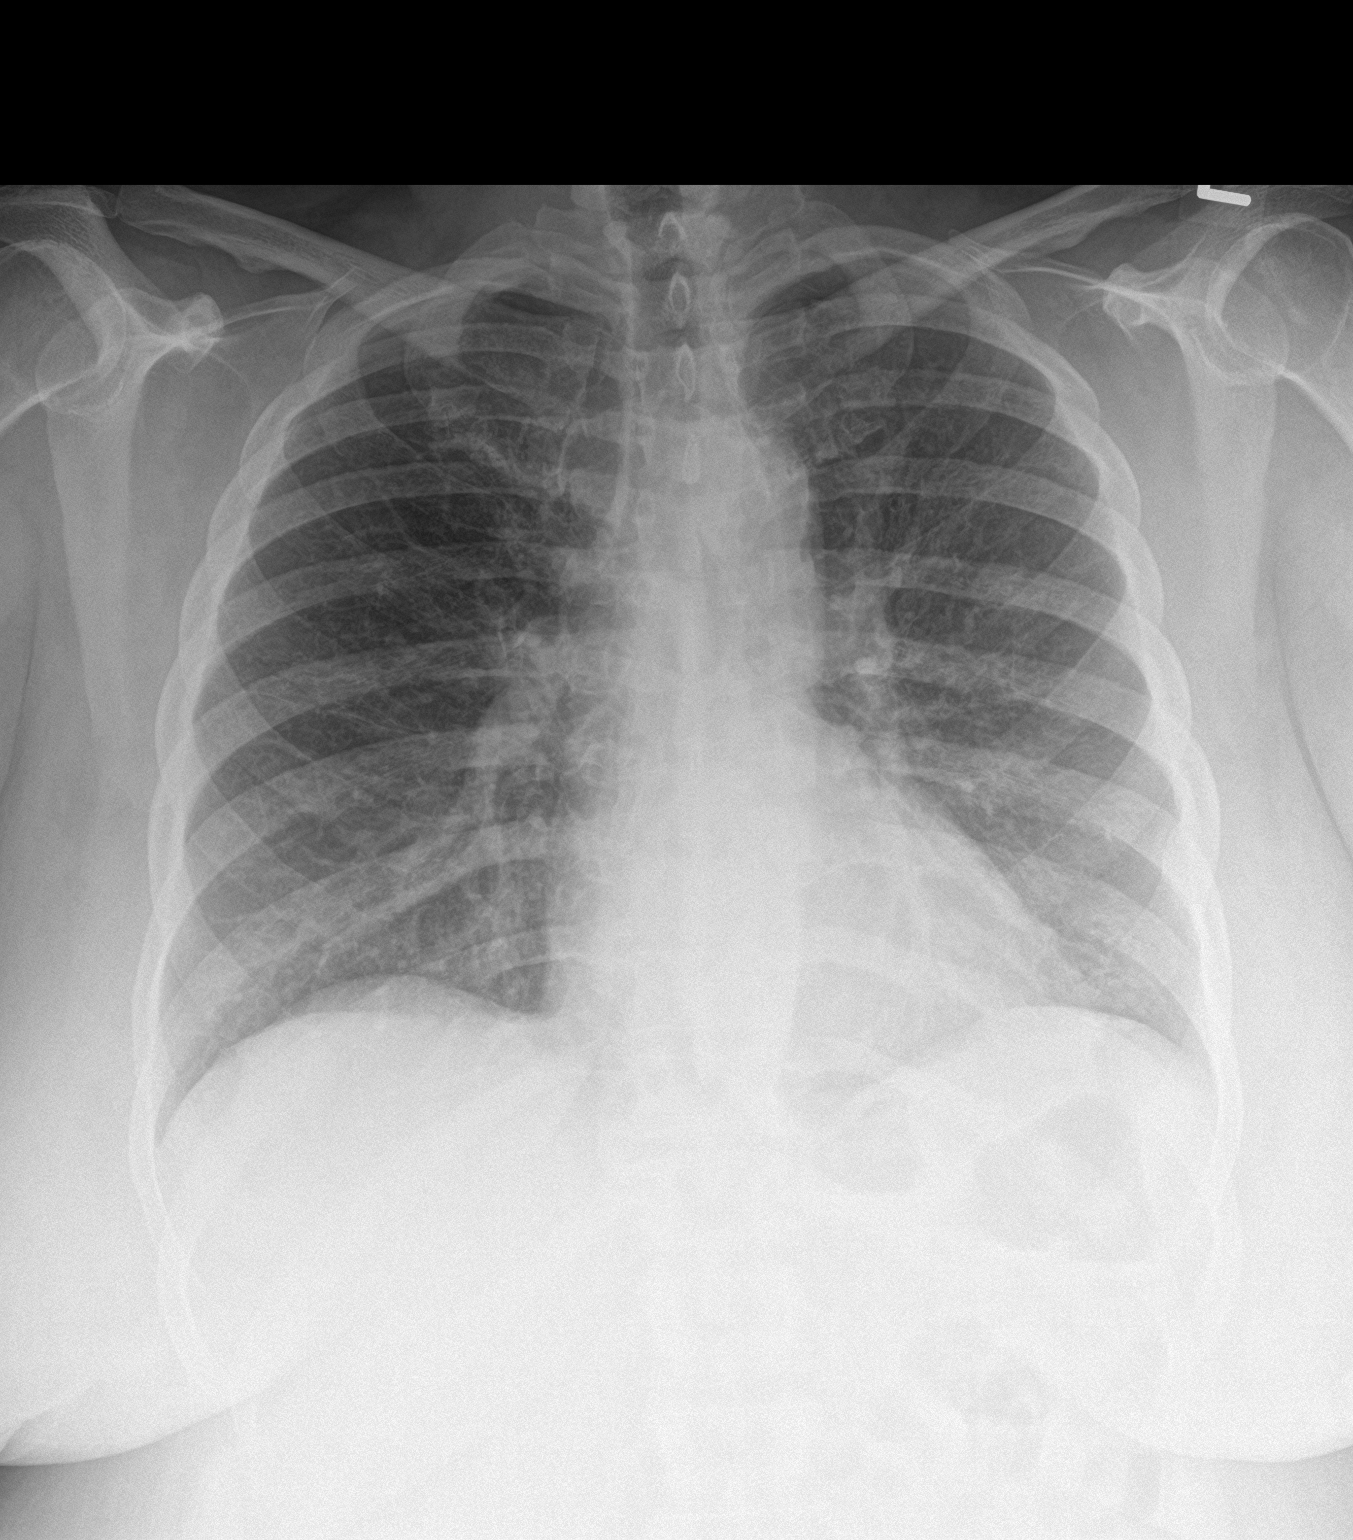

[chest lat]
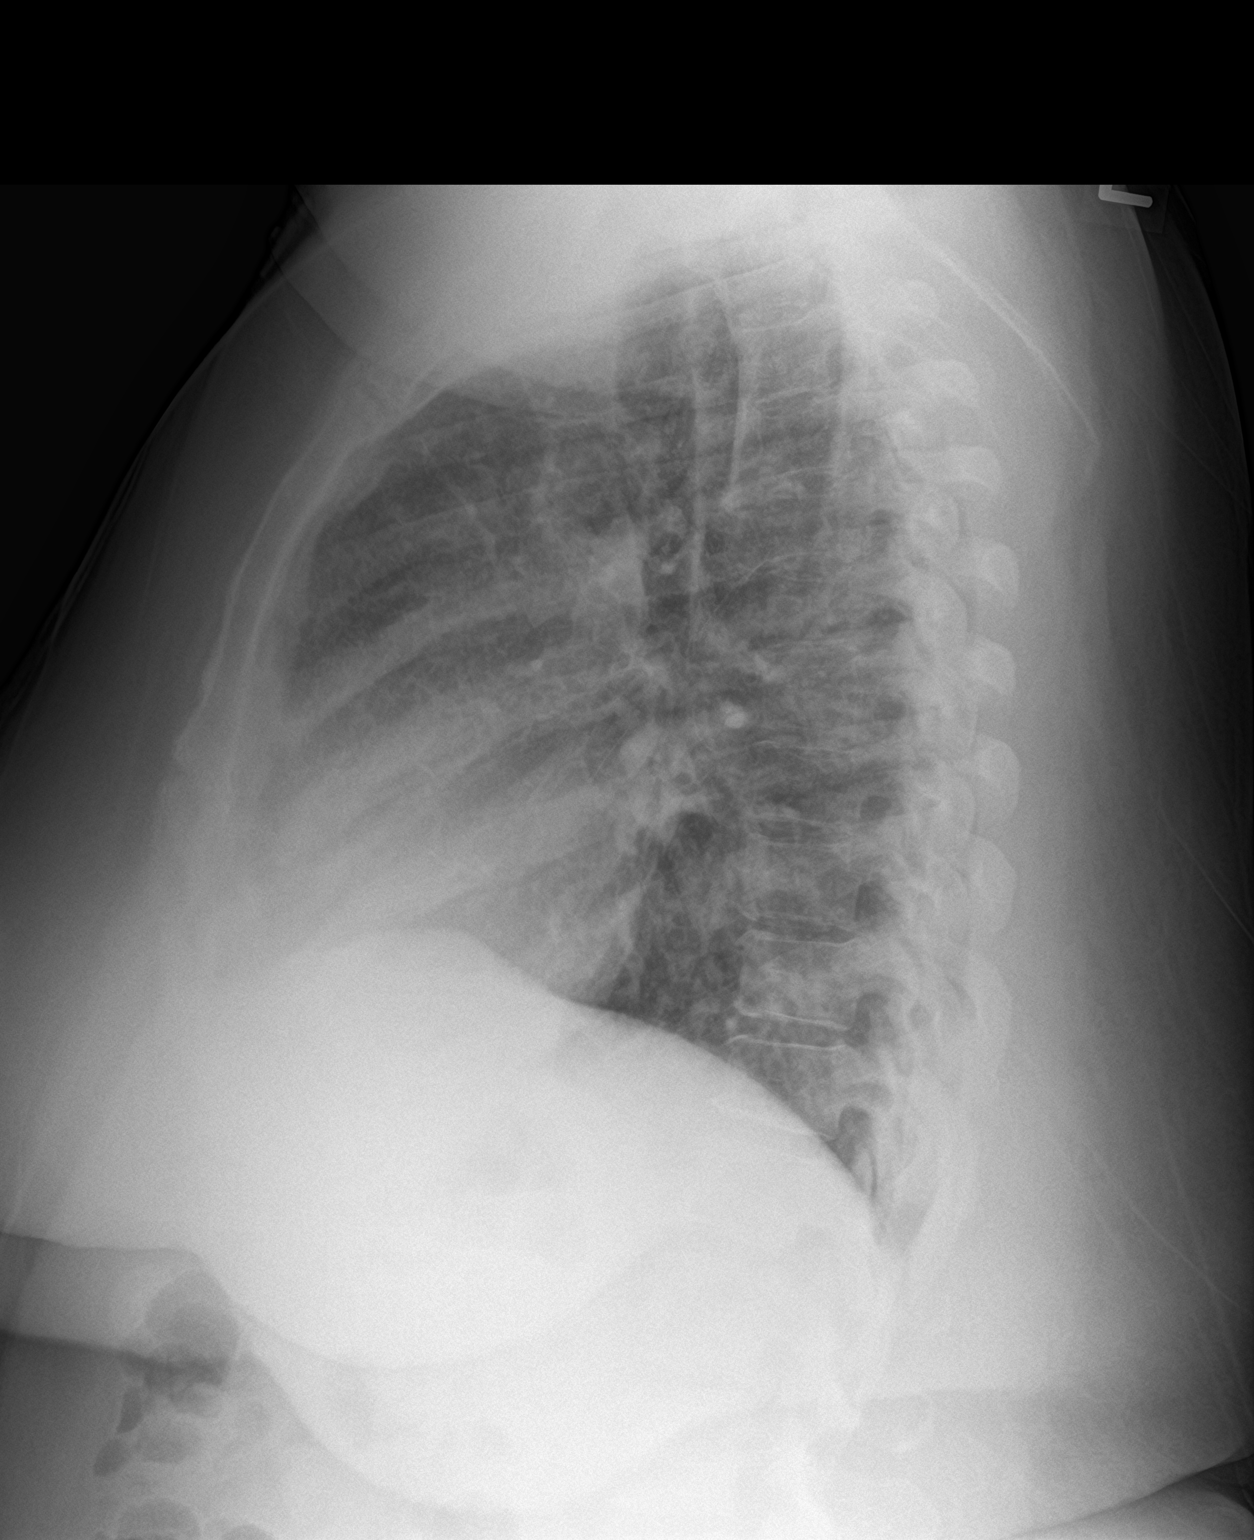

[2 of 2 positions shown; findings below may reference images not displayed]

FINDINGS: The heart size and mediastinal contours are within normal limits.
There is no focal infiltrate, pulmonary edema, or pleural effusion.
The visualized skeletal structures are unremarkable.
IMPRESSION: No active cardiopulmonary disease.

## 2019-05-12 NOTE — Telephone Encounter (Signed)
Late entry - Call received from patient inquiring where to schedule an appt to receive the  COVID vaccine at [facility].  Instructed her to call the main phone number for [facility] for the current information.  Also instructed her to speak to her pulmonologist prior to receiving the vaccine.

## 2019-05-20 ENCOUNTER — Ambulatory Visit: Payer: Medicare Other

## 2019-05-26 ENCOUNTER — Telehealth: Payer: Self-pay | Admitting: Internal Medicine

## 2019-05-26 NOTE — Telephone Encounter (Signed)
Spoke with pt. She has been scheduled for her injection on 05/28/2019 at 1530. Nothing further was needed.

## 2019-05-28 ENCOUNTER — Ambulatory Visit (INDEPENDENT_AMBULATORY_CARE_PROVIDER_SITE_OTHER): Payer: Medicare Other

## 2019-05-28 ENCOUNTER — Other Ambulatory Visit: Payer: Self-pay

## 2019-05-28 DIAGNOSIS — J4541 Moderate persistent asthma with (acute) exacerbation: Secondary | ICD-10-CM | POA: Diagnosis not present

## 2019-05-28 MED ORDER — DUPILUMAB 300 MG/2ML ~~LOC~~ SOSY
300.0000 mg | PREFILLED_SYRINGE | Freq: Once | SUBCUTANEOUS | Status: AC
  Administered 2019-05-28: 300 mg via SUBCUTANEOUS

## 2019-05-28 NOTE — Progress Notes (Signed)
All questions were answered by the patient before medication was administered. Have you been hospitalized in the last 10 days? No Do you have a fever? No Do you have a cough? No Do you have a headache or sore throat? No  

## 2019-05-31 ENCOUNTER — Telehealth: Payer: Self-pay | Admitting: Internal Medicine

## 2019-05-31 NOTE — Telephone Encounter (Signed)
Called [facility] to set up Patient's [place] delivery for injection appointment 08/09/19.  [facility] does not have Patient's consent to ship to office for 2021. ATC Patient.  Left message with instructions to contact [facility] for consent. Will follow up.

## 2019-06-01 IMAGING — DX DG CHEST 1V PORT
1 series · 1 of 1 positions shown · non-contrast
Comparison: 05/13/2018 chest radiograph.

CLINICAL DATA: Dyspnea, smoker

EXAM:
PORTABLE CHEST 1 VIEW

[chest]
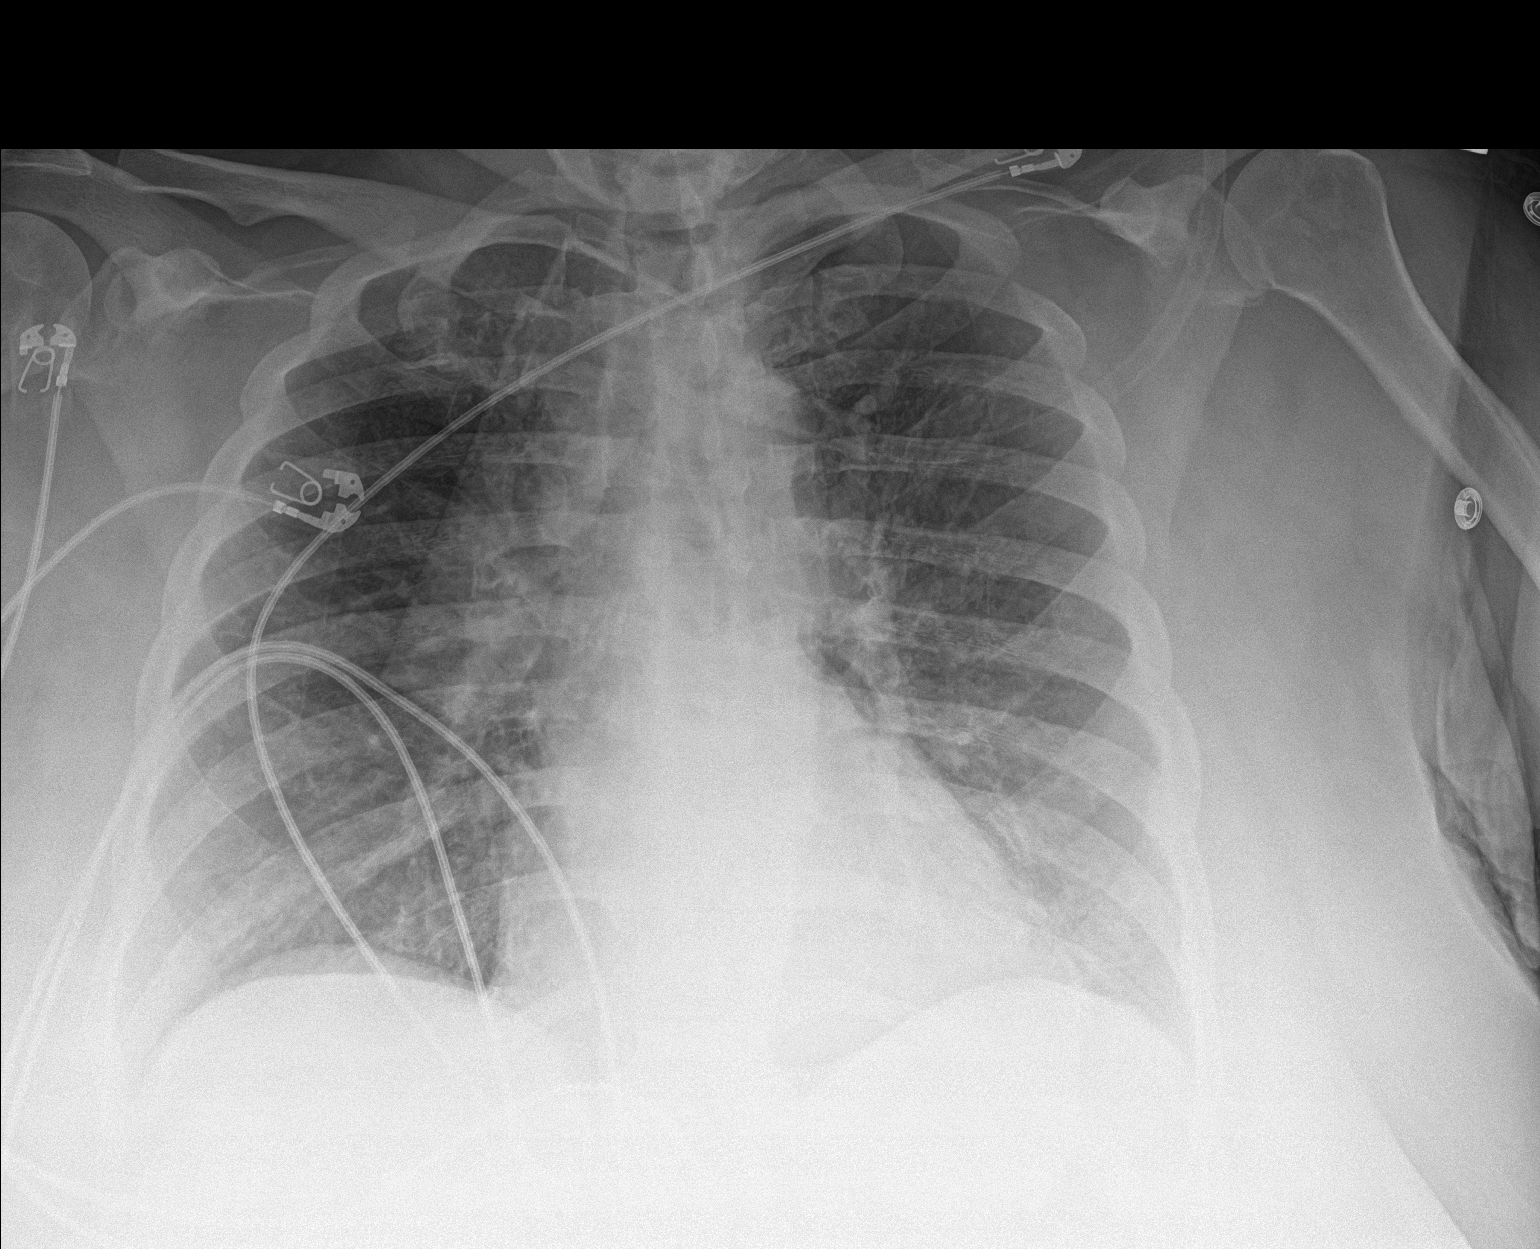

[1 of 1 positions shown; findings below may reference images not displayed]

FINDINGS: Stable cardiomediastinal silhouette with normal heart size. No
pneumothorax. No pleural effusion. Hazy curvilinear opacities in the
lingula silhouettes the left heart border, unchanged. No overt
pulmonary edema.
IMPRESSION: Stable hazy curvilinear opacities in the lingula silhouetting the
left heart border, suggesting atelectasis or mild pneumonia.
Recommend follow-up PA and lateral post treatment chest radiographs
in 4-6 weeks.

## 2019-06-02 IMAGING — DX DG CHEST 1V PORT
1 series · 1 of 1 positions shown · non-contrast
Comparison: 05/16/2018 chest radiograph.

CLINICAL DATA: Dyspnea

EXAM:
PORTABLE CHEST 1 VIEW

[chest ap]
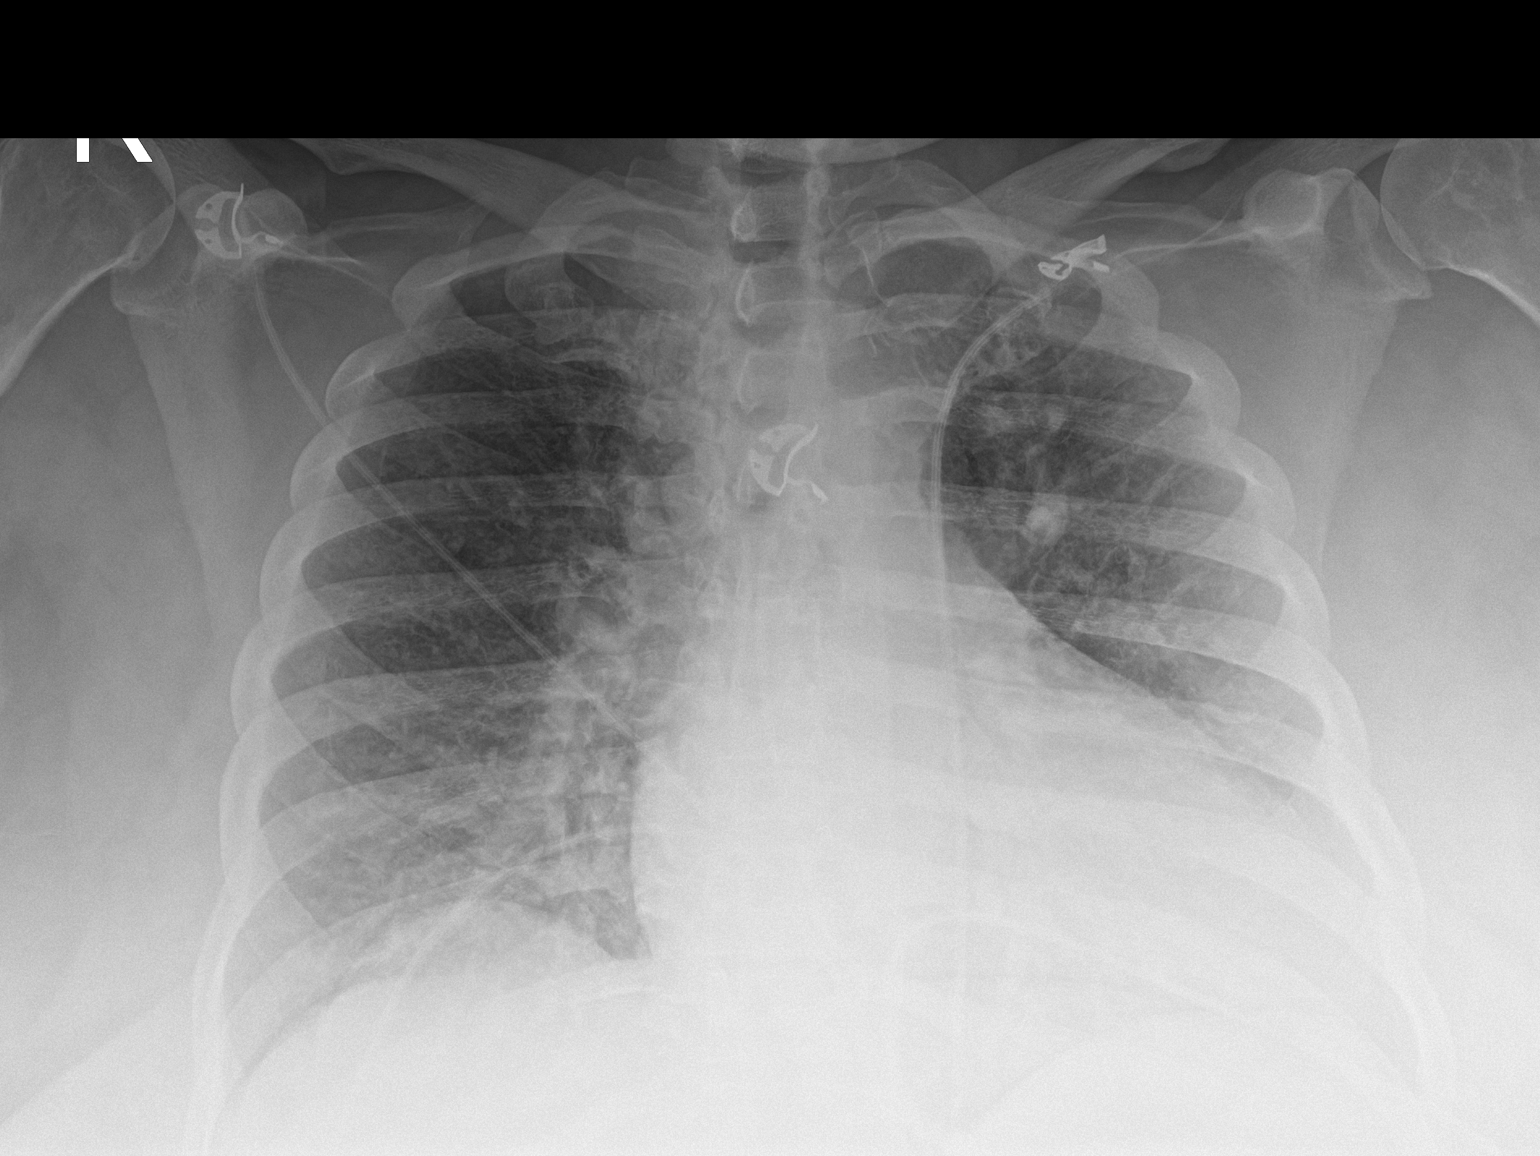

[1 of 1 positions shown; findings below may reference images not displayed]

FINDINGS: Stable cardiomediastinal silhouette with mild cardiomegaly. No
pneumothorax. No pleural effusion. Increased hazy linear parahilar
opacity in both lungs.
IMPRESSION: Mild cardiomegaly. Increased hazy linear parahilar opacity in both
lungs, favor mild pulmonary edema due to congestive heart failure.

## 2019-06-02 NOTE — Telephone Encounter (Signed)
Dupixent Order: 300mg  #2 prefilled syringe Ordered Date: 06/02/19 Expected date of arrival: 06/09/19 Ordered by: [place]: [facility]

## 2019-06-03 IMAGING — DX DG CHEST 1V PORT
1 series · 1 of 1 positions shown · non-contrast
Comparison: Yesterday

CLINICAL DATA: Shortness of breath for a week

EXAM:
PORTABLE CHEST 1 VIEW

[chest ap]
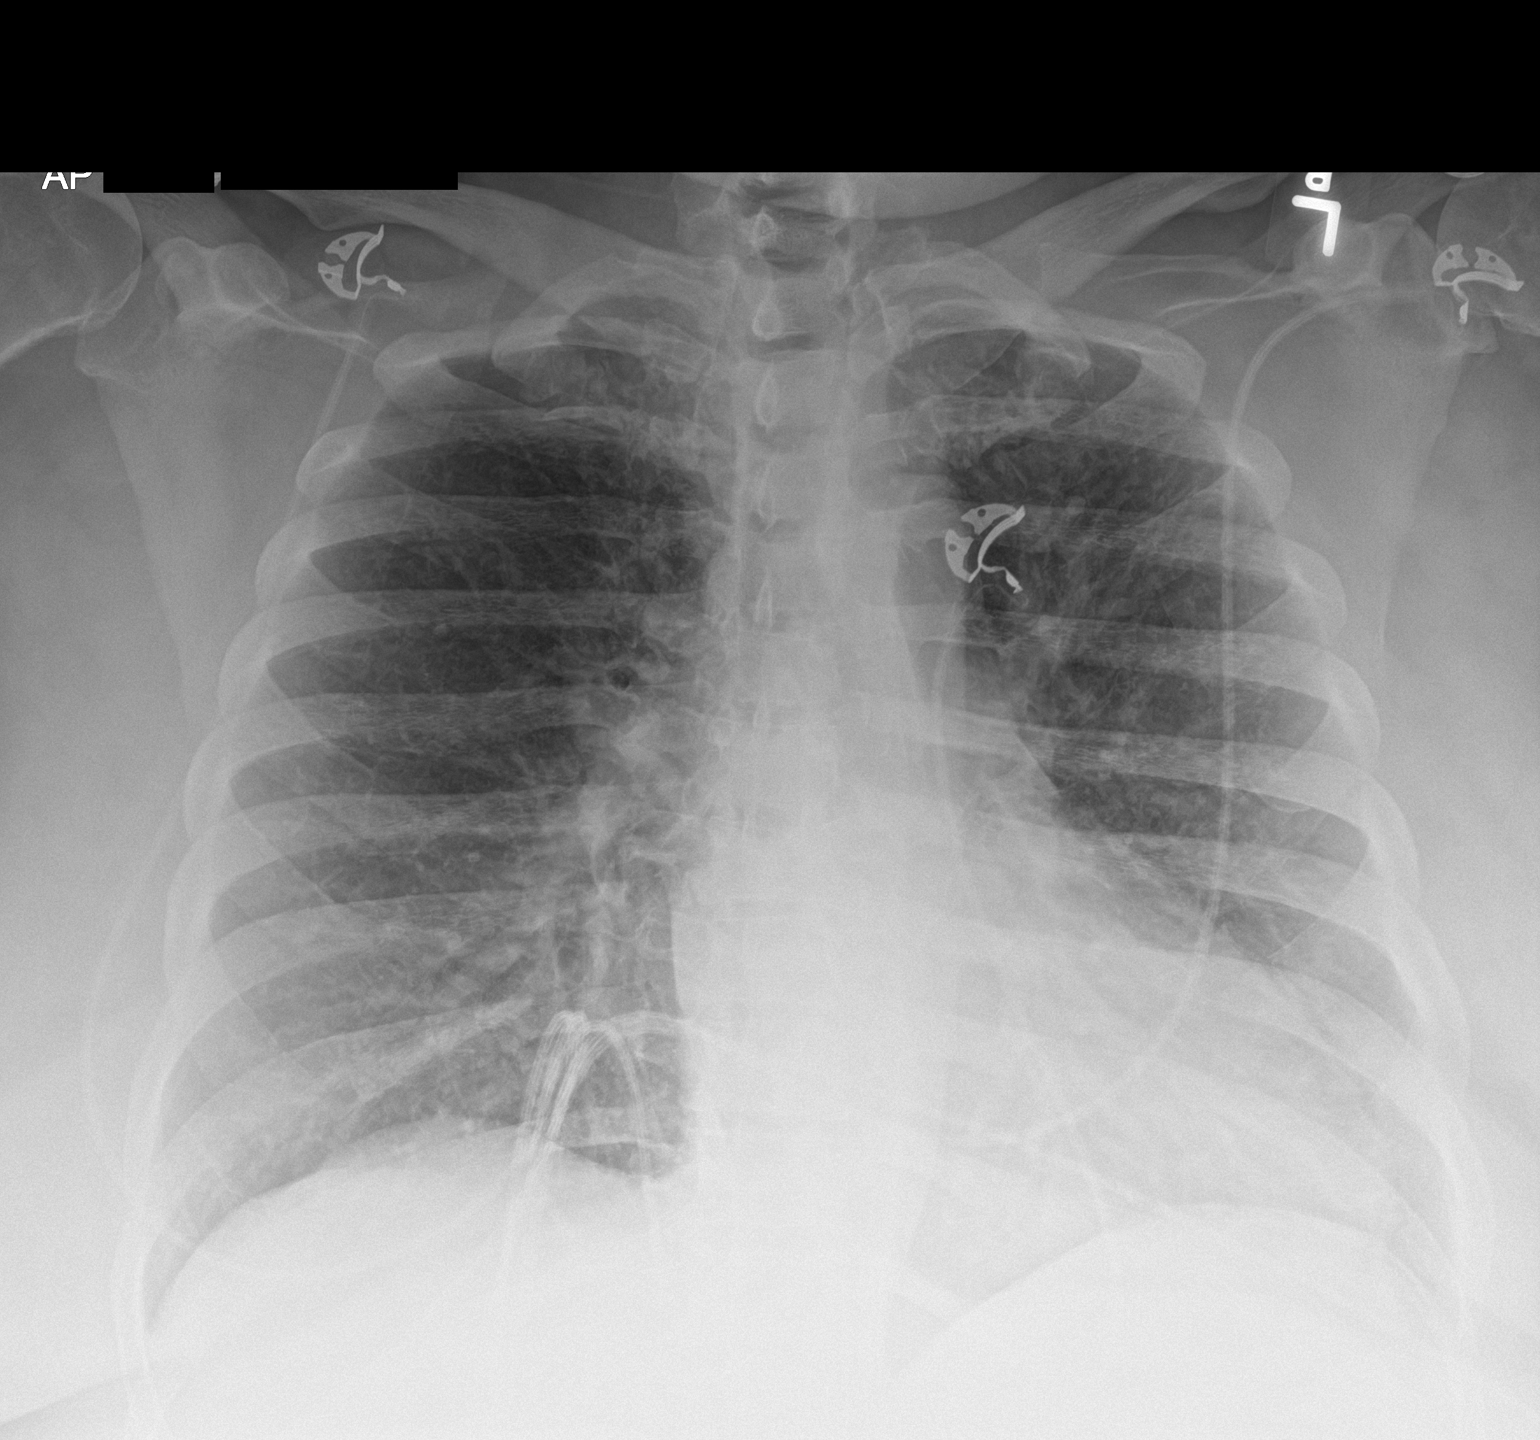

[1 of 1 positions shown; findings below may reference images not displayed]

FINDINGS: Chronic interstitial coarsening with fibrosis seen in the apical
lungs on September 2017 chest CT. Given the apical predilection by CT
sarcoid is considered, but there is no adenopathy. Chronic
cardiomegaly. There is no edema, consolidation, effusion, or
pneumothorax.
IMPRESSION: 1. No acute finding.
2. Cardiomegaly and apical predominant pulmonary fibrosis. Is there
history of sarcoid?

## 2019-06-06 IMAGING — CR DG CHEST 2V
2 series · 2 of 2 positions shown · non-contrast
Comparison: 05/18/2018, 05/17/2018, CT 10/08/2016

CLINICAL DATA: Short of breath

EXAM:
CHEST - 2 VIEW

[chest pa]
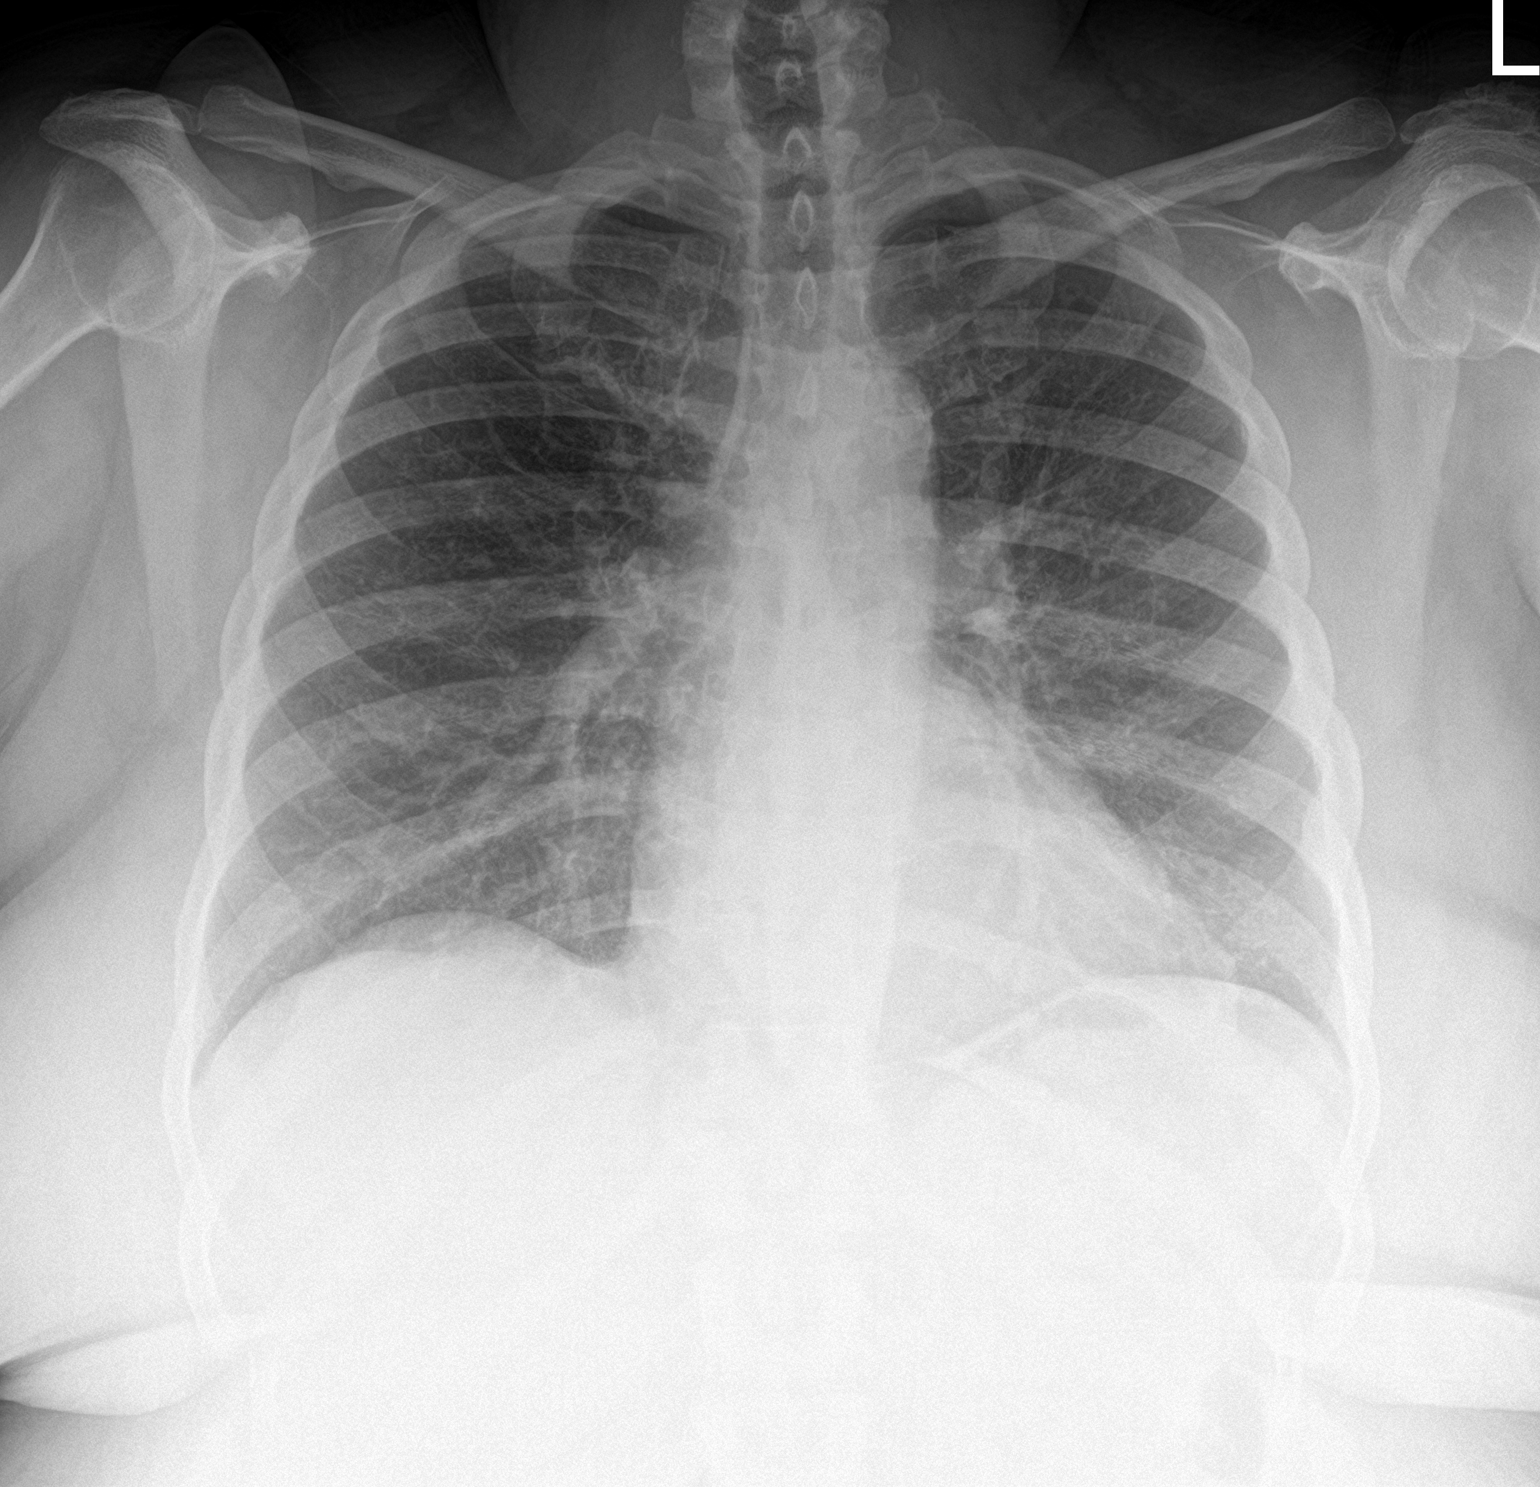

[chest lat]
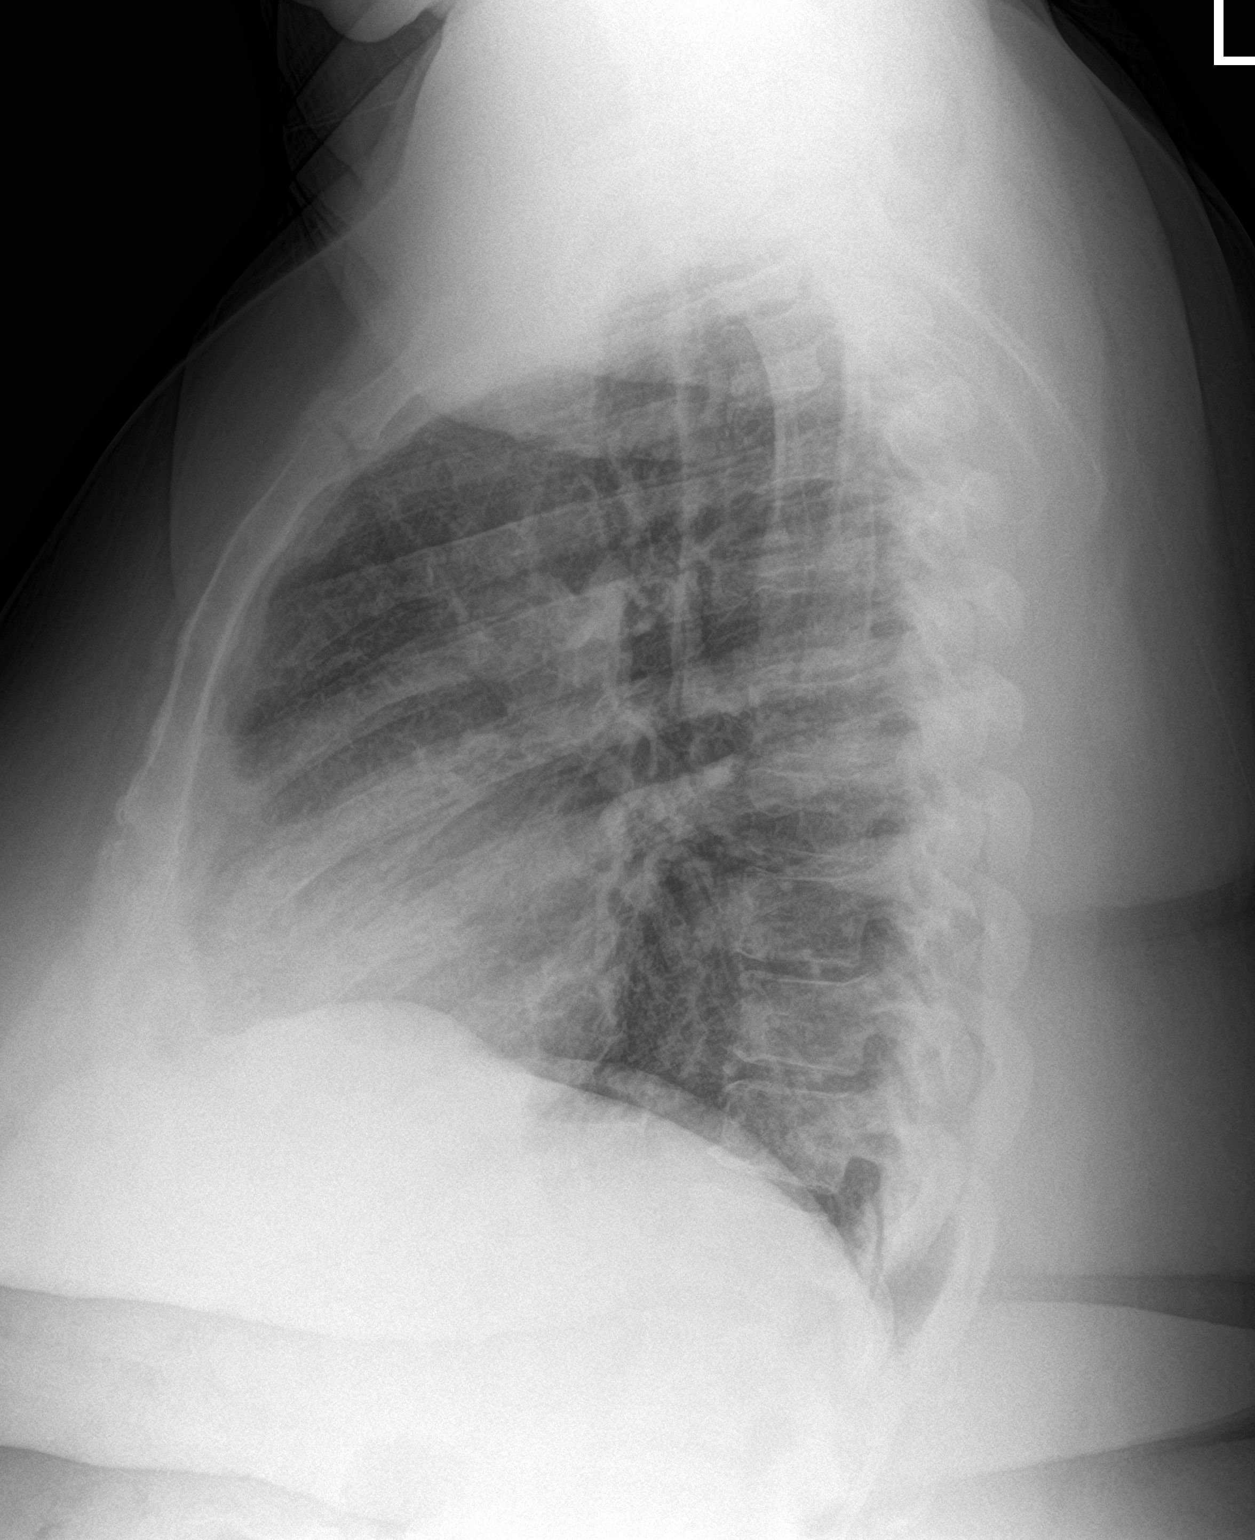

[2 of 2 positions shown; findings below may reference images not displayed]

FINDINGS: The heart size and mediastinal contours are within normal limits.
Both lungs are clear. The visualized skeletal structures are
unremarkable. Bronchitic changes. Mild apical fibrosis.
IMPRESSION: No active cardiopulmonary disease.  Mild apical fibrosis.

## 2019-06-09 NOTE — Telephone Encounter (Signed)
Dupixent Shipment Received: 300mg  #2 prefilled syringe Medication arrival date: 06/09/19 Lot #: [id number] Exp date: 10/27/2021 Received by: Elliot Dally

## 2019-06-11 ENCOUNTER — Ambulatory Visit (INDEPENDENT_AMBULATORY_CARE_PROVIDER_SITE_OTHER): Payer: Medicare Other

## 2019-06-11 ENCOUNTER — Other Ambulatory Visit: Payer: Self-pay

## 2019-06-11 DIAGNOSIS — J4541 Moderate persistent asthma with (acute) exacerbation: Secondary | ICD-10-CM

## 2019-06-11 MED ORDER — DUPILUMAB 300 MG/2ML ~~LOC~~ SOSY
300.0000 mg | PREFILLED_SYRINGE | Freq: Once | SUBCUTANEOUS | Status: AC
  Administered 2019-06-11: 300 mg via SUBCUTANEOUS

## 2019-06-11 NOTE — Progress Notes (Signed)
Have you been hospitalized within the last 10 days?  No Do you have a fever?  No Do you have a cough?  No Do you have a headache or sore throat? No  

## 2019-06-15 ENCOUNTER — Ambulatory Visit: Payer: Medicare Other | Admitting: Internal Medicine

## 2019-06-25 ENCOUNTER — Ambulatory Visit: Payer: Medicare Other

## 2019-06-25 ENCOUNTER — Telehealth: Payer: Self-pay | Admitting: Internal Medicine

## 2019-06-25 ENCOUNTER — Ambulatory Visit (INDEPENDENT_AMBULATORY_CARE_PROVIDER_SITE_OTHER): Payer: Medicare Other

## 2019-06-25 ENCOUNTER — Other Ambulatory Visit: Payer: Self-pay

## 2019-06-25 DIAGNOSIS — J4541 Moderate persistent asthma with (acute) exacerbation: Secondary | ICD-10-CM | POA: Diagnosis not present

## 2019-06-25 MED ORDER — DUPILUMAB 300 MG/2ML ~~LOC~~ SOSY
300.0000 mg | PREFILLED_SYRINGE | Freq: Once | SUBCUTANEOUS | Status: AC
  Administered 2019-06-25: 300 mg via SUBCUTANEOUS

## 2019-06-25 NOTE — Progress Notes (Signed)
Have you been hospitalized within the last 10 days?  No Do you have a fever?  No Do you have a cough?  No Do you have a headache or sore throat? No  

## 2019-06-25 NOTE — Telephone Encounter (Signed)
Called and spoke with Patient. Injection appointment changed to 1530, 06/25/19.  Nothing further.

## 2019-06-28 ENCOUNTER — Telehealth: Payer: Self-pay | Admitting: *Deleted

## 2019-06-28 NOTE — Telephone Encounter (Signed)
Called [facility] to set up shipment of [place]. Was told that it's too early to set up shipment. Will need to call back later this week to set up.

## 2019-06-30 NOTE — Telephone Encounter (Signed)
Called [facility] to set up shipment of [place]. Was told that it's too early to set up shipment. Will need to call back later this week to set up.

## 2019-07-02 NOTE — Telephone Encounter (Signed)
Contacted [facility] to follow up on pt's shipment of [place]. Was advised that the shipment can be set up but they are needing the pt's consent to ship.  LMTCB x1 for pt.

## 2019-07-04 ENCOUNTER — Other Ambulatory Visit: Payer: Self-pay | Admitting: Family Medicine

## 2019-07-04 DIAGNOSIS — R232 Flushing: Secondary | ICD-10-CM

## 2019-07-06 NOTE — Telephone Encounter (Signed)
Spoke with pt. She is aware that she needs to contact the pharmacy in order to have them ship her medication. Pt states that she will call them today.

## 2019-07-08 NOTE — Telephone Encounter (Addendum)
Called [facility] to set up shipment. Was advised that the pt still has not given consent to ship. They are going to try to reach out to the pt and get this and call us back to schedule shipment. Pt has an appointment on 07/09/19 for an injection, this has been canceled.

## 2019-07-09 ENCOUNTER — Ambulatory Visit: Payer: Medicare Other

## 2019-07-09 NOTE — Telephone Encounter (Signed)
Dupixent Shipment Received: 300mg  #2 prefilled syringe Medication arrival date: 07/09/2019 Lot #: [id number] Exp date: 09/2021 Received by: Desmond Dike, [place]

## 2019-07-12 ENCOUNTER — Telehealth: Payer: Self-pay | Admitting: Internal Medicine

## 2019-07-12 ENCOUNTER — Other Ambulatory Visit: Payer: Self-pay

## 2019-07-12 ENCOUNTER — Ambulatory Visit (INDEPENDENT_AMBULATORY_CARE_PROVIDER_SITE_OTHER): Payer: Medicare Other

## 2019-07-12 DIAGNOSIS — J4541 Moderate persistent asthma with (acute) exacerbation: Secondary | ICD-10-CM

## 2019-07-12 MED ORDER — DUPILUMAB 300 MG/2ML ~~LOC~~ SOSY
300.0000 mg | PREFILLED_SYRINGE | Freq: Once | SUBCUTANEOUS | Status: AC
  Administered 2019-07-12: 300 mg via SUBCUTANEOUS

## 2019-07-12 NOTE — Progress Notes (Signed)
All questions were answered by the patient before medication was administered. Have you been hospitalized in the last 10 days? No Do you have a fever? No Do you have a cough? No Do you have a headache or sore throat? No  

## 2019-07-12 NOTE — Telephone Encounter (Signed)
Spoke with pt. She has been scheduled to come in today at 1530. Nothing further was needed.

## 2019-07-15 ENCOUNTER — Ambulatory Visit: Payer: Medicare Other | Admitting: Internal Medicine

## 2019-07-23 ENCOUNTER — Other Ambulatory Visit: Payer: Self-pay | Admitting: Pharmacist

## 2019-07-23 DIAGNOSIS — K219 Gastro-esophageal reflux disease without esophagitis: Secondary | ICD-10-CM

## 2019-07-23 MED ORDER — OMEPRAZOLE 20 MG PO CPDR: 20.0000 mg | DELAYED_RELEASE_CAPSULE | Freq: Two times a day (BID) | ORAL | 0 refills | Status: DC

## 2019-07-25 ENCOUNTER — Other Ambulatory Visit: Payer: Self-pay | Admitting: Family Medicine

## 2019-07-25 DIAGNOSIS — R232 Flushing: Secondary | ICD-10-CM

## 2019-07-26 ENCOUNTER — Ambulatory Visit: Payer: Medicare Other

## 2019-07-28 ENCOUNTER — Ambulatory Visit (INDEPENDENT_AMBULATORY_CARE_PROVIDER_SITE_OTHER): Payer: Medicare Other

## 2019-07-28 ENCOUNTER — Other Ambulatory Visit: Payer: Self-pay

## 2019-07-28 DIAGNOSIS — J4541 Moderate persistent asthma with (acute) exacerbation: Secondary | ICD-10-CM | POA: Diagnosis not present

## 2019-07-28 MED ORDER — DUPILUMAB 300 MG/2ML ~~LOC~~ SOSY
300.0000 mg | PREFILLED_SYRINGE | Freq: Once | SUBCUTANEOUS | Status: AC
  Administered 2019-07-28: 300 mg via SUBCUTANEOUS

## 2019-07-28 NOTE — Progress Notes (Signed)
All questions were answered by the patient before medication was administered. Have you been hospitalized in the last 10 days? No Do you have a fever? No Do you have a cough? No Do you have a headache or sore throat? No  

## 2019-08-02 ENCOUNTER — Telehealth: Payer: Self-pay | Admitting: Internal Medicine

## 2019-08-02 NOTE — Telephone Encounter (Signed)
Dupixent Order: 300mg  #2 prefilled syringe Ordered Date: 08/02/2019 Expected date of arrival: 08/05/2019 Ordered by: Desmond Dike, [place]  Specialty Pharmacy: [facility]

## 2019-08-05 ENCOUNTER — Other Ambulatory Visit: Payer: Self-pay

## 2019-08-05 ENCOUNTER — Ambulatory Visit: Payer: Medicare Other | Attending: Family Medicine | Admitting: Family Medicine

## 2019-08-05 DIAGNOSIS — K219 Gastro-esophageal reflux disease without esophagitis: Secondary | ICD-10-CM

## 2019-08-05 DIAGNOSIS — R232 Flushing: Secondary | ICD-10-CM | POA: Diagnosis not present

## 2019-08-05 DIAGNOSIS — G4709 Other insomnia: Secondary | ICD-10-CM | POA: Diagnosis not present

## 2019-08-05 DIAGNOSIS — F329 Major depressive disorder, single episode, unspecified: Secondary | ICD-10-CM

## 2019-08-05 DIAGNOSIS — Z87891 Personal history of nicotine dependence: Secondary | ICD-10-CM

## 2019-08-05 DIAGNOSIS — J4541 Moderate persistent asthma with (acute) exacerbation: Secondary | ICD-10-CM

## 2019-08-05 DIAGNOSIS — F419 Anxiety disorder, unspecified: Secondary | ICD-10-CM

## 2019-08-05 DIAGNOSIS — I480 Paroxysmal atrial fibrillation: Secondary | ICD-10-CM

## 2019-08-05 DIAGNOSIS — M25561 Pain in right knee: Secondary | ICD-10-CM

## 2019-08-05 DIAGNOSIS — F32A Depression, unspecified: Secondary | ICD-10-CM

## 2019-08-05 DIAGNOSIS — G8929 Other chronic pain: Secondary | ICD-10-CM

## 2019-08-05 DIAGNOSIS — E119 Type 2 diabetes mellitus without complications: Secondary | ICD-10-CM | POA: Diagnosis not present

## 2019-08-05 DIAGNOSIS — M25562 Pain in left knee: Secondary | ICD-10-CM

## 2019-08-05 DIAGNOSIS — Z79899 Other long term (current) drug therapy: Secondary | ICD-10-CM

## 2019-08-05 DIAGNOSIS — G629 Polyneuropathy, unspecified: Secondary | ICD-10-CM

## 2019-08-05 MED ORDER — HYDROXYZINE HCL 25 MG PO TABS: 25.0000 mg | ORAL_TABLET | Freq: Three times a day (TID) | ORAL | 1 refills | Status: DC | PRN

## 2019-08-05 MED ORDER — CLONIDINE HCL 0.3 MG PO TABS: 0.3000 mg | ORAL_TABLET | Freq: Every day | ORAL | 6 refills | Status: DC

## 2019-08-05 MED ORDER — ZOLPIDEM TARTRATE 5 MG PO TABS: ORAL_TABLET | ORAL | 3 refills | Status: DC

## 2019-08-05 MED ORDER — OMEPRAZOLE 20 MG PO CPDR: 20.0000 mg | DELAYED_RELEASE_CAPSULE | Freq: Two times a day (BID) | ORAL | 6 refills | Status: DC

## 2019-08-05 NOTE — Progress Notes (Signed)
Virtual Visit via Video Note  I connected with April [name], on 08/05/2019 at 11:45 AM by video enabled telemedicine device due to the COVID-19 pandemic and verified that I am speaking with the correct person using two identifiers.   Consent: I discussed the limitations, risks, security and privacy concerns of performing an evaluation and management service by telemedicine and the availability of in person appointments. I also discussed with the patient that there may be a patient responsible charge related to this service. The patient expressed understanding and agreed to proceed.   Location of Patient: Home  Location of Provider: Clinic   Persons participating in Telemedicine visit: April [name]-CMA Dr. Margarita Rana     History of Present Illness: April [name] is a 49 year old female with a history of morbid obesity, major depressive disorder, hypertension, COPD/ Asthma , paroxysmal atrial fibrillation obstructive sleep apnea (on CPAP) with multiple hospitalizations for asthma exacerbation/COPD , steroid-induced diabetes  (previously on metformin)seen for follow-up visit.  She is currently on diet control for management of her diabetes and last A1c from 02/2020 was 5.6. Currently followed by bariatric surgery and is on Vyvanse.  She suffers from neuropathy in her feet and chronic low back pain and chronic knee pain, followed by pain management. PDMP reviewed.Looks like she is on Lyrica '300mg'$  and Gabapentin 1200 tid from pain mgt ; Also receives Percocet 10/325 #150 from pain mgt - she is unsure of her medications. Her asthma has been stable on Dupixent and has appointment with pulmonary consult next month.  She complains of feeling depressed and does have anxiety attacks.  Currently on Cymbalta which she does not feel has been effective.  Choice of Cymbalta was made due to analgesic properties in the light of her chronic pain.  She denies suicidal ideation or  intent. Also complains of hot flashes which were initially controlled when her clonidine was increased from 0.1 mg to 0.2 mg but now she finds herself having hot flashes throughout the day.  She does have insomnia especially when she keeps her grandchildren and is currently on Ambien. She has not been taking Eliquis due to the fact that her Uncle died while on Eliquis.  Discussed with her the importance of anticoagulation especially with her history of atrial fibrillation.  Past Medical History:  Diagnosis Date  . Acanthosis nigricans   . Anxiety   . Arthritis    "knees" (04/28/2017)  . Asthma    Followed by Dr. Annamaria Boots (pulmonology); receives every other week omalizumab injections; has frequent exacerbations  . Chronic diastolic CHF (congestive heart failure) ([place]) 01/17/2017  . COPD (chronic obstructive pulmonary disease) ([place])    PFTs in 2002, FEV1/FVC 65, no post bronchodilater test done  . Depression   . GERD (gastroesophageal reflux disease)   . Headache(784.0)    "q couple days" (04/28/2017)  . Helicobacter pylori (H. pylori) infection   . Hypertension, essential   . Insomnia   . Menorrhagia   . Morbid obesity ([place])   . OSA on CPAP    Sleep study 2008 - mild OSA, not enough events to titrate CPAP; wears CPAP now/pt on 04/28/2017  . Pneumonia X 1  . Seasonal allergies   . Shortness of breath   . Tobacco user    Allergies  Allergen Reactions  . Contrast Media [Iodinated Diagnostic Agents] Itching    Ct contrast    Current Outpatient Medications on File Prior to Visit  Medication Sig Dispense Refill  . albuterol (  PROVENTIL) (2.5 MG/3ML) 0.083% nebulizer solution Take 3 mLs (2.5 mg total) by nebulization every 6 (six) hours as needed for wheezing or shortness of breath. Dx: Asthma 75 mL 3  . albuterol (VENTOLIN HFA) 108 (90 Base) MCG/ACT inhaler Inhale 2 puffs into the lungs every 4 (four) hours as needed for wheezing or shortness of breath. Dx: Asthma 18 g 3  . cloNIDine  (CATAPRES) 0.2 MG tablet Take 1 tablet (0.2 mg total) by mouth at bedtime. Must have office visit for refills 90 tablet 0  . cyclobenzaprine (FLEXERIL) 10 MG tablet Take 10 mg by mouth 3 (three) times daily as needed for muscle spasms.    . DULoxetine (CYMBALTA) 60 MG capsule Take 1 capsule (60 mg total) by mouth daily. Dx: Depression 90 capsule 1  . dupilumab (DUPIXENT) 300 MG/2ML prefilled syringe Inject 300 mg into the skin every 14 (fourteen) days. 2 mL 11  . EPINEPHrine 0.3 mg/0.3 mL IJ SOAJ injection Inject 0.3 mLs (0.3 mg total) into the muscle as needed for anaphylaxis. 1 each 1  . Ferrous Sulfate (IRON) 325 (65 Fe) MG TABS Take 1 tablet (325 mg total) by mouth every morning. (Patient taking differently: Take 1 tablet by mouth daily. ) 30 each 0  . fluticasone (FLONASE) 50 MCG/ACT nasal spray Place 1 spray into both nostrils daily. 30 g 2  . furosemide (LASIX) 40 MG tablet TAKE 1 TABLET(40 MG) BY MOUTH DAILY (Patient taking differently: Take 40 mg by mouth daily. ) 90 tablet 0  . gabapentin (NEURONTIN) 600 MG tablet Take 1,200 mg by mouth 3 (three) times daily.    . hydrALAZINE (APRESOLINE) 10 MG tablet Take 1 tablet (10 mg total) by mouth every 8 (eight) hours for 30 days. 90 tablet 1  . hydrocortisone-pramoxine (ANALPRAM HC) 2.5-1 % rectal cream Place 1 application rectally 3 (three) times daily. 30 g 1  . ipratropium (ATROVENT) 0.02 % nebulizer solution Take 2.5 mLs (0.5 mg total) by nebulization 4 (four) times daily. 75 mL 12  . metoprolol tartrate (LOPRESSOR) 25 MG tablet Take 1 tablet (25 mg total) by mouth 2 (two) times daily. Dx: HTN 60 tablet 3  . mometasone-formoterol (DULERA) 200-5 MCG/ACT AERO Inhale 2 puffs into the lungs 2 (two) times daily. Dx: Asthma 8.8 g 3  . montelukast (SINGULAIR) 10 MG tablet Take 1 tablet (10 mg total) by mouth at bedtime. Dx: Asthma 30 tablet 3  . omeprazole (PRILOSEC) 20 MG capsule Take 1 capsule (20 mg total) by mouth 2 (two) times daily before a meal.  60 capsule 0  . oxyCODONE-acetaminophen (PERCOCET) 10-325 MG tablet Take 1 tablet by mouth 5 (five) times daily as needed for pain.    [name] OZEMPIC, 1 MG/DOSE, 2 MG/1.5ML SOPN Inject 1 mg into the skin every Wednesday.     . phentermine 15 MG capsule Take 15 mg by mouth daily.    . potassium chloride SA (KLOR-CON) 20 MEQ tablet Take 1 tablet (20 mEq total) by mouth daily. 30 tablet 3  . pregabalin (LYRICA) 75 MG capsule Take 75 mg by mouth 2 (two) times daily.    . Vitamin D, Ergocalciferol, (DRISDOL) 1.25 MG (50000 UT) CAPS capsule TAKE 1 CAPSULE BY MOUTH 1 TIME A WEEK (Patient taking differently: Take 50,000 Units by mouth every Wednesday. ) 9 capsule 1  . VYVANSE 70 MG capsule Take 70 mg by mouth daily.    [name] zolpidem (AMBIEN) 5 MG tablet TAKE 1 TABLET(5 MG) BY MOUTH AT BEDTIME 30 tablet  2  . azithromycin (ZITHROMAX) 250 MG tablet Take 1 tablet (250 mg total) by mouth daily. Take first 2 tablets together, then 1 every day until finished. (Patient not taking: Reported on 03/24/2019) 6 tablet 0  . ELIQUIS 5 MG TABS tablet TAKE 1 TABLET(5 MG) BY MOUTH TWICE DAILY (Patient not taking: Reported on 08/05/2019) 60 tablet 3  . HYDROcodone-homatropine (HYCODAN) 5-1.5 MG/5ML syrup Take 5 mLs by mouth every 6 (six) hours as needed for cough. (Patient not taking: Reported on 03/24/2019) 120 mL 0  . nicotine (NICODERM CQ - DOSED IN MG/24 HOURS) 21 mg/24hr patch Place 1 patch (21 mg total) onto the skin daily. (Patient not taking: Reported on 03/24/2019) 28 patch 1  . predniSONE (DELTASONE) 20 MG tablet Take 3 tablets by mouth daily x1 day; 2 tablets by mouth daily x2 days; then 1 tablet by mouth daily x3 days; then half tablet by mouth daily x3 days and stop prednisone. (Patient not taking: Reported on 08/05/2019) 12 tablet 0   No current facility-administered medications on file prior to visit.    Observations/Objective: BP 137/83 Awake, alert, oriented x3 Not in acute distress Obese  Lab Results  Component  Value Date   HGBA1C 5.6 (A) 03/24/2019    Assessment and Plan: 1. Type 2 diabetes mellitus without complication, without long-term current use of insulin ([place] Diet controlled with A1c of 5.6 Due for A1c which has been ordered Counseled on Diabetic diet, my plate method, 778 minutes of moderate intensity exercise/week Blood sugar logs with fasting goals of 80-120 mg/dl, random of less than 180 and in the event of sugars less than 60 mg/dl or greater than 400 mg/dl encouraged to notify the clinic. Advised on the need for annual eye exams, annual foot exams, Pneumonia vaccine. - CMP14+EGFR; Future - Microalbumin / creatinine urine ratio; Future - Hemoglobin A1c; Future  2. Other insomnia Uncontrolled This is worsened by hot flashes and when she has her grandkids Continue sleep hygiene She is on multiple high risk medications and is at risk of respiratory depression Maximum dose of Ambien prescribed will be 5 mg - zolpidem (AMBIEN) 5 MG tablet; TAKE 1 TABLET(5 MG) BY MOUTH AT BEDTIME  Dispense: 30 tablet; Refill: 3  3. Hot flashes Uncontrolled Increase clonidine from 0.2 mg to 0.3 mg She will monitor her blood pressure due to antihypertensive effect of clonidine If uncontrolled consider Effexor will need to discontinue Cymbalta and - cloNIDine (CATAPRES) 0.3 MG tablet; Take 1 tablet (0.3 mg total) by mouth at bedtime. For hot flashes  Dispense: 30 tablet; Refill: 6  4. Gastroesophageal reflux disease without esophagitis Stable - omeprazole (PRILOSEC) 20 MG capsule; Take 1 capsule (20 mg total) by mouth 2 (two) times daily before a meal.  Dispense: 60 capsule; Refill: 6  5. AF (paroxysmal atrial fibrillation) (HCC) Advised to resume Eliquis Continue metoprolol for rate control  6. Anxiety and depression Uncontrolled Exacerbated by underlying chronic medical conditions We will add on hydroxyzine as needed Schedule with LCSW for counseling Consider changing SSRI if symptoms  persist - hydrOXYzine (ATARAX/VISTARIL) 25 MG tablet; Take 1 tablet (25 mg total) by mouth 3 (three) times daily as needed.  Dispense: 60 tablet; Refill: 1  7. Moderate persistent asthma with exacerbation Stable Continue Dupixent Continue ICS/LABA, Singulair, MDI Follow up with pulmonary  8. Chronic pain of both knees Stable She is on Percocet 10/325  9.  Neuropathy Med list also reveals presence of Lyrica, gabapentin and she is unsure of what she  is taking PDMP reviewed revealed a prescription for Lyrica, Percocet 10/320 and I will have her follow-up with the clinical pharmacist for medication and as she is on multiple high-risk medication. Vised she should not be taking gabapentin and Lyrica  10.  High risk medication use Currently on several high-risk medications which puts her at risk of respiratory depression, dependence; we have discussed this Needs Narcan rx from Pain mgt. She is currently on Percocet, Lyrica, zolpidem Also on gabapentin Polypharmacy also a major issue-we will have her bring in her medications to review this with the clinical pharmacist.   Follow Up Instructions: Return for Medication reconciliation with clinical pharmacist;; 3 months with PCP.    I discussed the assessment and treatment plan with the patient. The patient was provided an opportunity to ask questions and all were answered. The patient agreed with the plan and demonstrated an understanding of the instructions.   The patient was advised to call back or seek an in-person evaluation if the symptoms worsen or if the condition fails to improve as anticipated.     I provided 23 minutes total of Telehealth time during this encounter including median intraservice time, reviewing previous notes, investigations, ordering medications, medical decision making, coordinating care and patient verbalized understanding at the end of the visit.     [name], MD, FAAFP. [facility]  and [facility], [place]   08/05/2019, 11:45 AM

## 2019-08-05 NOTE — Telephone Encounter (Signed)
Dupixent Shipment Received: 300mg  #2 prefilled syringe Medication arrival date: 08/05/19 Lot #: [id number] Exp date: 12/26/21 Received by: Elliot Dally

## 2019-08-05 NOTE — Progress Notes (Signed)
Would like to discuss medication for hot flashes.

## 2019-08-09 ENCOUNTER — Ambulatory Visit: Payer: Medicare Other | Admitting: Pharmacist

## 2019-08-12 ENCOUNTER — Encounter: Payer: Self-pay | Admitting: Internal Medicine

## 2019-08-12 ENCOUNTER — Other Ambulatory Visit: Payer: Self-pay

## 2019-08-12 ENCOUNTER — Ambulatory Visit: Payer: Medicare Other

## 2019-08-12 ENCOUNTER — Ambulatory Visit (INDEPENDENT_AMBULATORY_CARE_PROVIDER_SITE_OTHER): Payer: Medicare Other | Admitting: Internal Medicine

## 2019-08-12 DIAGNOSIS — Z720 Tobacco use: Secondary | ICD-10-CM | POA: Diagnosis not present

## 2019-08-12 DIAGNOSIS — G4709 Other insomnia: Secondary | ICD-10-CM | POA: Diagnosis not present

## 2019-08-12 DIAGNOSIS — Z6841 Body Mass Index (BMI) 40.0 and over, adult: Secondary | ICD-10-CM

## 2019-08-12 DIAGNOSIS — J449 Chronic obstructive pulmonary disease, unspecified: Secondary | ICD-10-CM | POA: Diagnosis not present

## 2019-08-12 DIAGNOSIS — J4541 Moderate persistent asthma with (acute) exacerbation: Secondary | ICD-10-CM | POA: Diagnosis not present

## 2019-08-12 MED ORDER — DUPILUMAB 300 MG/2ML ~~LOC~~ SOSY
300.0000 mg | PREFILLED_SYRINGE | Freq: Once | SUBCUTANEOUS | Status: AC
  Administered 2019-08-12: 300 mg via SUBCUTANEOUS

## 2019-08-12 MED ORDER — TEMAZEPAM 30 MG PO CAPS: 30.0000 mg | ORAL_CAPSULE | Freq: Every evening | ORAL | 5 refills | Status: DC | PRN

## 2019-08-12 MED ORDER — ALBUTEROL SULFATE HFA 108 (90 BASE) MCG/ACT IN AERS: INHALATION_SPRAY | RESPIRATORY_TRACT | 12 refills | Status: DC

## 2019-08-12 MED ORDER — IPRATROPIUM-ALBUTEROL 0.5-2.5 (3) MG/3ML IN SOLN: RESPIRATORY_TRACT | 12 refills | Status: DC

## 2019-08-12 MED ORDER — BREO ELLIPTA 100-25 MCG/INH IN AEPB: INHALATION_SPRAY | RESPIRATORY_TRACT | 12 refills | Status: DC

## 2019-08-12 NOTE — Progress Notes (Signed)
Patient ID: April [name], female    DOB: 1970-10-13, 49 y.o.   MRN: [id number]   HPI F former smoker followed for chronic severe asthma/ Xolair complicated by non-compliance,  OSA/ CPAP,  morbid obesity, HBP, depression, GERD, AFib/ Eliquis  NPSG 03/31/07- AHI 5.8/ hr, weight 330 lbs. She canceled planned more recent study. Office spirometry 10/24/14- severe obstructive airways disease. FVC 2.10/66%, FEV1 1.25/47%, FEV1/FVC 0.59, FEF 25-75 percent 0.64/19% Unattended HST 07/06/16- AHI 11/hour, desaturation to 74%. Office Spirometry 07/29/2016-moderately severe obstructive airways disease. FVC 2.21/68%, FEV1 1.53/57%, ratio 0.69, FEF 25-75% 0.98/34% Prolonged hosp [facility] 4/19- tracheostomy. Dupixent enrolled 10/22/2018 HST 12/26/2018- AHI 9.7/ hr, desaturation to 9.7/ hr, desaturation to 69% -------------------------------------------------------    02/11/2019- 49 year old female former smoker followed for chronic severe asthma/Xolair, Tracheostomy at [facility] [id number],  complicated by noncompliance, OSA/CPAP, morbid obesity, HBP, depression, GERD Dupixent enrolled 10/22/2018- PA coverage issue- Injections begun. Her insurance only covers 5 injections per set before another PA required.  Hosp- ED 10/26/2018- severe asthma Off prednisone. Using [facility] and with Dupixent she uses rescue inhaler 2-3x/ week.  HST 12/26/2018- AHI 9.7/ hr, desaturation to 9.7/ hr, desaturation to 69% She felt claustrophobic in past trying full-face CPAP mask. For her very mild OSSA, we discussed looking into oral appliance before trying CPAP/ nasal pillows. Ambien 5 mg doesn't last- she wakes after 2 hours and can't sustain sleep. I asked her to try adding an otc like Zzquil before we look at other options. CXR 10/26/2018-  IMPRESSION: No active disease.  08/12/19- 49 year old female former smoker followed for chronic severe asthma/Xolair, Tracheostomy at [facility] [id number],  complicated by noncompliance, OSA/ oral appliance, morbid  obesity, HBP, depression, GERD Dupixent enrolled 10/22/2018 Had failed CPAP- intol- has oral appliance Body weight today 333 lbs -----f/u OSA /COPD  Neb albuterol/ ipratropium, Ventolin HFA, Dupixent, Dulera 200, ambien 5,  No hosp in past year Breathing comfortable with little wheeze or cough and infrequent need for rescue inhaler at night. Dupixent works well. She often keeps her grandchildren who interfere with sleep at night. Ambien 5 mg no longer works. No caffeine or stimulants. Going to [facility] for PFT and Bariatric Surgery.   Review of Systems-See HPI  + = positive Constitutional:   + weight loss, night sweats, fevers, chills, +fatigue, lassitude. HEENT:   No-  headaches, difficulty swallowing, tooth/dental problems, sore throat,    sneezing, itching, ear ache, +nasal congestion, post nasal drip,  CV:  No-   chest pain, orthopnea, PND, swelling in lower extremities, anasarca, dizziness, palpitations Resp: +shortness of breath with exertion or at rest.              productive cough,  + non-productive cough,  No- coughing up of blood.              No-   change in color of mucus. wheezing.   Skin: No-   rash or lesions. GI:  No-   heartburn, indigestion, abdominal pain, nausea, vomiting,  GU: MS:  No-   joint pain or swelling.   Neuro-     nothing unusual Psych:  No- change in mood or affect. No depression or anxiety.  No memory loss.    Objective:   Physical Exam   + wheelchair General- Alert, Oriented, Affect-appropriate, Distress- none acute, + morbid  obesity,  Skin- rash-none, lesions- none, excoriation- none Lymphadenopathy- none Head- atraumatic            Eyes- Gross vision intact, PERRLA, conjunctivae  clear secretions            Ears- Hearing, canals-normal            Nose- Clear, no-Septal dev, mucus, polyps, erosion, perforation. + sniffing            Throat- Mallampati III , mucosa clear , drainage- none, tonsils- atrophic,                           +  Missing  teeth, +upper denture plate is out                  Neck- flexible , trachea+ stoma scar, no stridor , thyroid nl, carotid no bruit Chest - symmetrical excursion , unlabored           Heart/CV- RRR , no murmur , no gallop  , no rub, nl s1 s2                           - JVD- none , edema- none, stasis changes- none, varices- none           Lung- +Diminished/ clear now, unlabored.  Wheeze -None, cough-none,                                               dullness-none,  rub- none           Chest wall-  Abd-  Br/ Gen/ Rectal- Not done, not indicated Extrem- cyanosis- none, clubbing, none, atrophy- none, strength- nl Neuro- grossly intact to observation

## 2019-08-12 NOTE — Patient Instructions (Signed)
Continue dupixent injections  We have changed your albuterol rescue inhaler to Proair, but told pharmacy they could substitute for your insurance  We have changed your ambien to temazepam 30 mg  We have changed your Dulera maintenance inhaler to Breo  Inhale 1 puff, then rinse mouth, once daily  Please call as needed

## 2019-08-12 NOTE — Progress Notes (Signed)
All questions were answered by the patient before medication was administered. Have you been hospitalized in the last 10 days? No Do you have a fever? No Do you have a cough? No Do you have a headache or sore throat? No  

## 2019-08-17 ENCOUNTER — Other Ambulatory Visit: Payer: Self-pay | Admitting: Family Medicine

## 2019-08-17 DIAGNOSIS — J4541 Moderate persistent asthma with (acute) exacerbation: Secondary | ICD-10-CM

## 2019-08-18 IMAGING — DX PORTABLE CHEST - 1 VIEW
1 series · 1 of 1 positions shown · non-contrast
Comparison: 05/21/2018

CLINICAL DATA: Dyspnea

EXAM:
PORTABLE CHEST 1 VIEW

[chest]
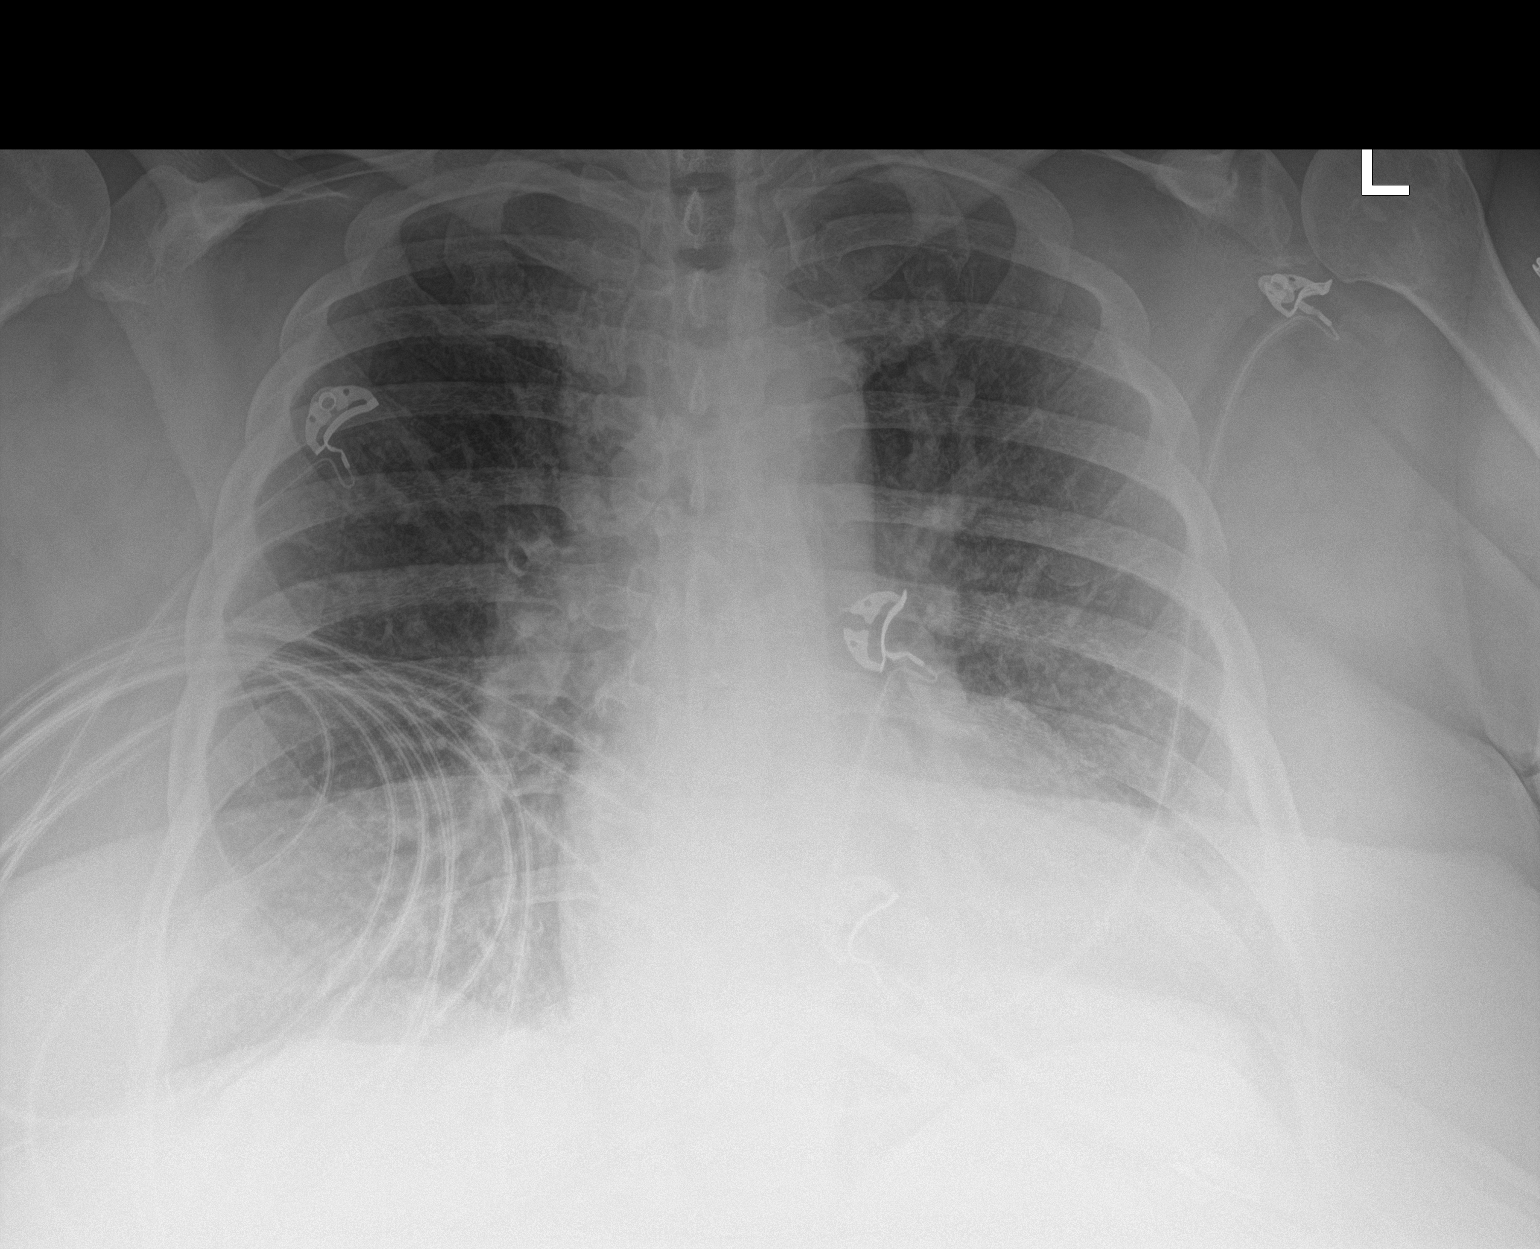

[1 of 1 positions shown; findings below may reference images not displayed]

FINDINGS: Mild cardiomegaly without pulmonary edema. No focal airspace
consolidation. No pleural effusion or pneumothorax.
IMPRESSION: Mild cardiomegaly without pulmonary edema.

## 2019-08-25 ENCOUNTER — Ambulatory Visit: Payer: Medicare Other

## 2019-08-30 ENCOUNTER — Telehealth: Payer: Self-pay | Admitting: Internal Medicine

## 2019-08-31 ENCOUNTER — Telehealth: Payer: Self-pay | Admitting: Internal Medicine

## 2019-08-31 ENCOUNTER — Other Ambulatory Visit: Payer: Self-pay | Admitting: *Deleted

## 2019-08-31 NOTE — Telephone Encounter (Signed)
LMTCB x1 for pt.  

## 2019-09-01 ENCOUNTER — Telehealth: Payer: Self-pay | Admitting: Internal Medicine

## 2019-09-02 NOTE — Telephone Encounter (Signed)
Dupixent Order: 300mg  #2 prefilled syringe Ordered Date: 09/02/2019 Expected date of arrival: 09/03/2019 Ordered by: Desmond Dike, [place]  Specialty Pharmacy: [facility]

## 2019-09-02 NOTE — Telephone Encounter (Signed)
Duplicate message. 

## 2019-09-02 NOTE — Telephone Encounter (Signed)
Pt called back please return call-- [phone]

## 2019-09-03 ENCOUNTER — Ambulatory Visit (INDEPENDENT_AMBULATORY_CARE_PROVIDER_SITE_OTHER): Payer: Medicare Other

## 2019-09-03 ENCOUNTER — Other Ambulatory Visit: Payer: Self-pay

## 2019-09-03 DIAGNOSIS — J4541 Moderate persistent asthma with (acute) exacerbation: Secondary | ICD-10-CM

## 2019-09-03 MED ORDER — DUPILUMAB 300 MG/2ML ~~LOC~~ SOSY
300.0000 mg | PREFILLED_SYRINGE | Freq: Once | SUBCUTANEOUS | Status: AC
  Administered 2019-09-03: 300 mg via SUBCUTANEOUS

## 2019-09-03 NOTE — Progress Notes (Signed)
Have you been hospitalized within the last 10 days?  No Do you have a fever?  No Do you have a cough?  No Do you have a headache or sore throat? No  

## 2019-09-03 NOTE — Telephone Encounter (Signed)
Dupixent Shipment Received: 300mg  #2 prefilled syringe Medication arrival date: 09/03/19 Lot #: [id number] Exp date: 11/25/2021 Received by: Elliot Dally

## 2019-09-03 NOTE — Telephone Encounter (Signed)
Patient scheduled 09/03/19. Nothing further at this time.

## 2019-09-12 NOTE — Assessment & Plan Note (Signed)
Sys she quit smoking 2016

## 2019-09-12 NOTE — Assessment & Plan Note (Signed)
Asthma/ COPD overlap. Very much better control with [place]- continue Dupixent, refill ProAir and Dulera. Needs neb solution.

## 2019-09-12 NOTE — Assessment & Plan Note (Addendum)
Emphasize better sleep habits, including management of grandchildren Plan- change ambien to to temazepam with discussion

## 2019-09-12 NOTE — Assessment & Plan Note (Signed)
Now exploring bariatric surgery with program in [place]

## 2019-09-18 ENCOUNTER — Other Ambulatory Visit: Payer: Self-pay | Admitting: Family Medicine

## 2019-09-18 DIAGNOSIS — F419 Anxiety disorder, unspecified: Secondary | ICD-10-CM

## 2019-09-18 DIAGNOSIS — F32A Depression, unspecified: Secondary | ICD-10-CM

## 2019-09-20 LAB — HM COLONOSCOPY

## 2019-09-21 ENCOUNTER — Ambulatory Visit: Payer: Medicare Other

## 2019-09-23 ENCOUNTER — Other Ambulatory Visit: Payer: Self-pay | Admitting: Family Medicine

## 2019-09-23 ENCOUNTER — Telehealth: Payer: Self-pay | Admitting: Internal Medicine

## 2019-09-23 DIAGNOSIS — G629 Polyneuropathy, unspecified: Secondary | ICD-10-CM

## 2019-09-28 ENCOUNTER — Ambulatory Visit (INDEPENDENT_AMBULATORY_CARE_PROVIDER_SITE_OTHER): Payer: Medicare Other

## 2019-09-28 ENCOUNTER — Other Ambulatory Visit: Payer: Self-pay

## 2019-09-28 DIAGNOSIS — J4541 Moderate persistent asthma with (acute) exacerbation: Secondary | ICD-10-CM | POA: Diagnosis not present

## 2019-09-28 DIAGNOSIS — J4550 Severe persistent asthma, uncomplicated: Secondary | ICD-10-CM

## 2019-09-28 MED ORDER — DUPILUMAB 300 MG/2ML ~~LOC~~ SOSY
300.0000 mg | PREFILLED_SYRINGE | Freq: Once | SUBCUTANEOUS | Status: AC
  Administered 2019-09-28: 300 mg via SUBCUTANEOUS

## 2019-09-29 NOTE — Telephone Encounter (Signed)
Pt has been rescheduled to 10/12/19.

## 2019-10-06 ENCOUNTER — Telehealth: Payer: Self-pay | Admitting: Internal Medicine

## 2019-10-06 NOTE — Telephone Encounter (Signed)
Dupixent Order: 300mg  #2 prefilled syringe Ordered Date: 10/06/19 Expected date of arrival: 10/08/19 Ordered by: [place]: [facility]

## 2019-10-08 NOTE — Telephone Encounter (Signed)
Dupixent Shipment Received: 300mg  #2 prefilled syringe Medication arrival date: 10/08/2019 Lot #: [id number] Exp date: 12/2021 Received by: Desmond Dike, [place]

## 2019-10-12 ENCOUNTER — Ambulatory Visit: Payer: Medicare Other

## 2019-10-20 ENCOUNTER — Ambulatory Visit (INDEPENDENT_AMBULATORY_CARE_PROVIDER_SITE_OTHER): Payer: Medicare Other

## 2019-10-20 ENCOUNTER — Other Ambulatory Visit: Payer: Self-pay

## 2019-10-20 DIAGNOSIS — J4541 Moderate persistent asthma with (acute) exacerbation: Secondary | ICD-10-CM

## 2019-10-20 MED ORDER — DUPILUMAB 300 MG/2ML ~~LOC~~ SOSY
300.0000 mg | PREFILLED_SYRINGE | Freq: Once | SUBCUTANEOUS | Status: AC
  Administered 2019-10-20: 300 mg via SUBCUTANEOUS

## 2019-10-20 NOTE — Progress Notes (Signed)
All questions were answered by the patient before medication was administered. Have you been hospitalized in the last 10 days? No Do you have a fever? No Do you have a cough? No Do you have a headache or sore throat? No  

## 2019-10-21 IMAGING — DX PORTABLE CHEST - 1 VIEW
1 series · 1 of 1 positions shown · non-contrast
Comparison: 08/02/2018

CLINICAL DATA: Shortness of breath, cough

EXAM:
PORTABLE CHEST 1 VIEW

[chest ap]
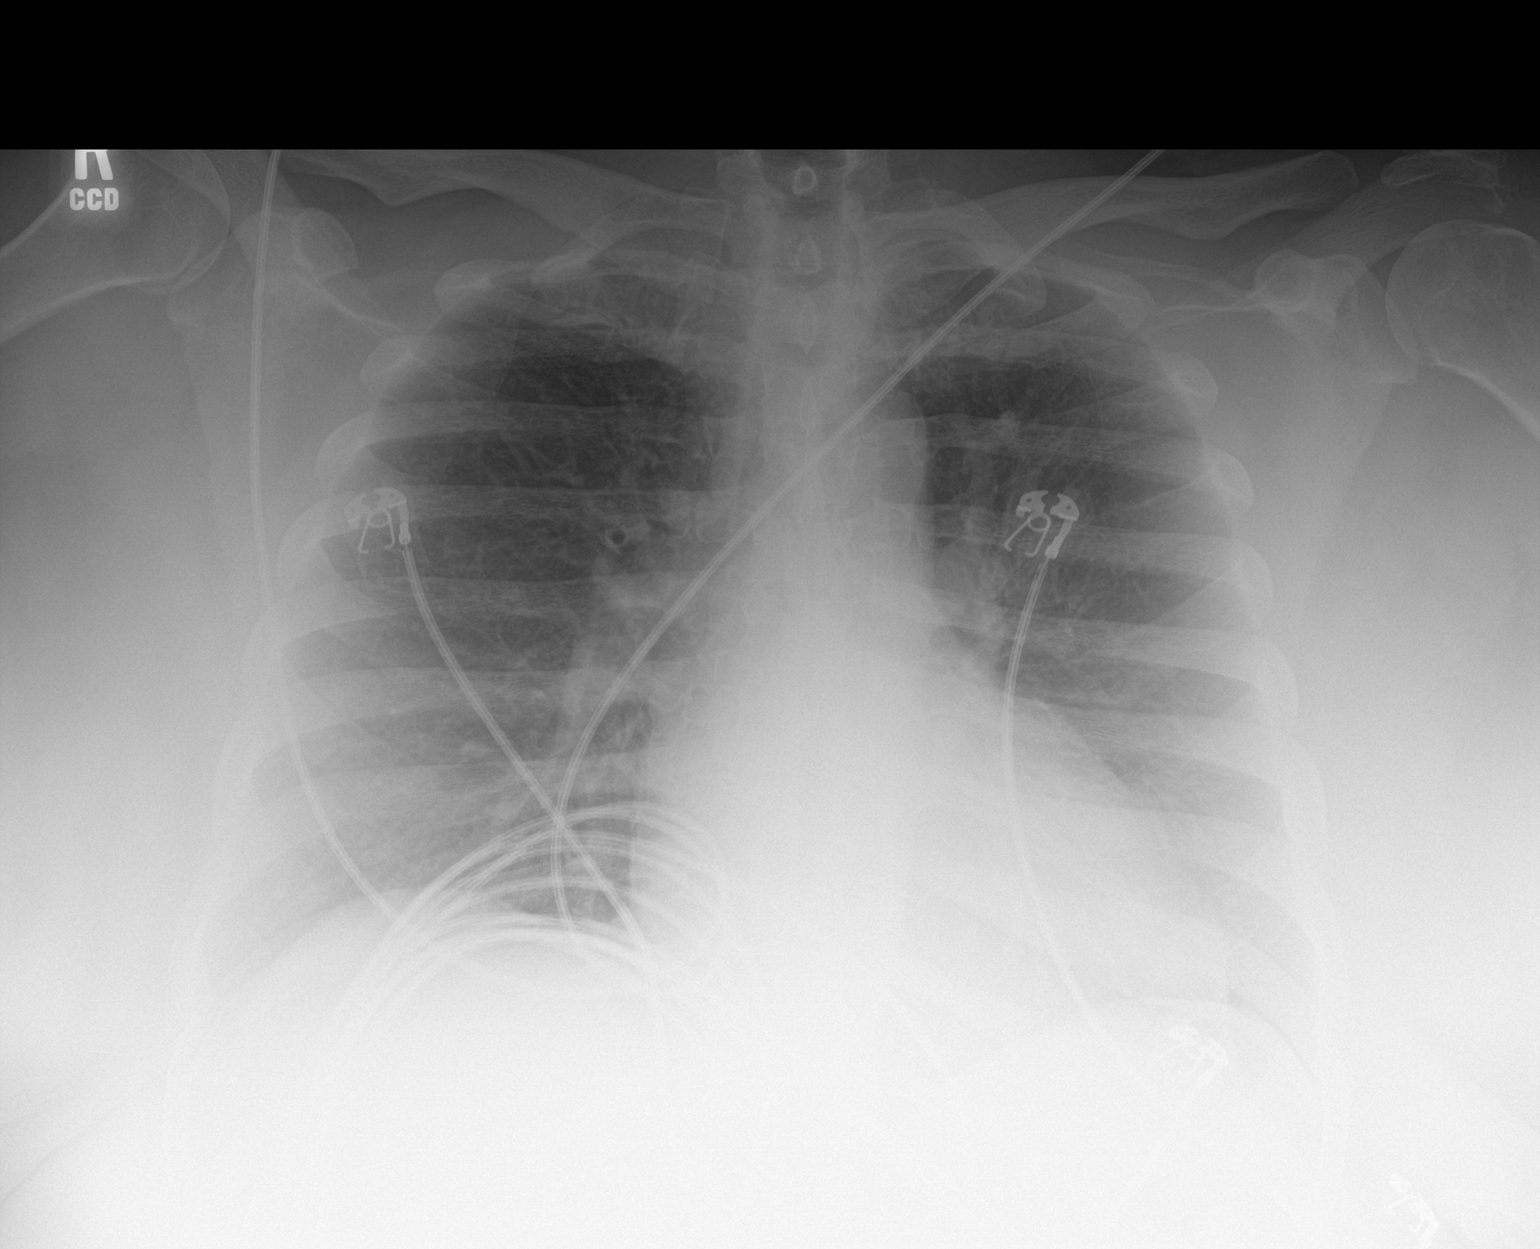

[1 of 1 positions shown; findings below may reference images not displayed]

FINDINGS: Heart is borderline in size. Lungs clear. No effusions. No acute
bony abnormality.
IMPRESSION: No active disease.

## 2019-10-31 ENCOUNTER — Other Ambulatory Visit: Payer: Self-pay | Admitting: Family Medicine

## 2019-10-31 DIAGNOSIS — F32A Depression, unspecified: Secondary | ICD-10-CM

## 2019-10-31 NOTE — Telephone Encounter (Signed)
Requested Prescriptions  Pending Prescriptions Disp Refills  . hydrOXYzine (ATARAX/VISTARIL) 25 MG tablet [Pharmacy Med Name: HYDROXYZINE HCL 25MG  TABS (WHITE)] 90 tablet 1    Sig: TAKE 1 TABLET(25 MG) BY MOUTH THREE TIMES DAILY AS NEEDED     Ear, Nose, and Throat:  Antihistamines Passed - 10/31/2019  3:30 AM      Passed - Valid encounter within last 12 months    Recent Outpatient Visits          2 months ago Type 2 diabetes mellitus without complication, without long-term current use of insulin ([place])   [place], Charlane Ferretti, MD   7 months ago Other hemorrhoids   [place], Charlane Ferretti, MD   11 months ago Hot flashes   [place], Charlane Ferretti, MD   1 year ago Moderate persistent asthma with acute exacerbation   [place], [place]   1 year ago Moderate persistent asthma with acute exacerbation   [facility] [name], MD

## 2019-11-02 ENCOUNTER — Other Ambulatory Visit: Payer: Self-pay | Admitting: Family Medicine

## 2019-11-02 DIAGNOSIS — G4709 Other insomnia: Secondary | ICD-10-CM

## 2019-11-02 NOTE — Telephone Encounter (Signed)
Patient called and advised Ambien is no longer on her current medication list. She says she's been taking it and had a refill a couple of months ago. She also said she will be going to the hospital later on tonight because of her lungs with congestion and coughing. She says she will call tomorrow to let someone know or they will see it on her chart.

## 2019-11-02 NOTE — Telephone Encounter (Signed)
Copied from [place] [id number]. Topic: Quick Communication - Rx Refill/Question >> Nov 02, 2019  4:56 PM Mcneil, Ja-Kwan wrote: Medication: zolpidem (AMBIEN) 5 MG tablet  Has the patient contacted their pharmacy? no  Preferred Pharmacy (with phone number or street name): [id number] - [place], [address] Phone: [phone]   Fax: [phone]  Agent: Please be advised that RX refills may take up to 3 business days. We ask that you follow-up with your pharmacy.

## 2019-11-02 NOTE — Telephone Encounter (Signed)
Requested medication (s) are due for refill today: Yes  Requested medication (s) are on the active medication list: No  Last refill:  07/2019  Future visit scheduled: No  Notes to clinic:  Unable to refill, cannot delagate, medication not on current medication list     Requested Prescriptions  Pending Prescriptions Disp Refills   zolpidem (AMBIEN) 5 MG tablet 30 tablet 3    Sig: TAKE 1 TABLET(5 MG) BY MOUTH AT BEDTIME      Not Delegated - Psychiatry:  Anxiolytics/Hypnotics Failed - 11/02/2019  5:19 PM      Failed - This refill cannot be delegated      Failed - Urine Drug Screen completed in last 360 days.      Passed - Valid encounter within last 6 months    Recent Outpatient Visits           2 months ago Type 2 diabetes mellitus without complication, without long-term current use of insulin ([place])   [place], Charlane Ferretti, MD   7 months ago Other hemorrhoids   [place] [name], MD   11 months ago Hot flashes   [name], Charlane Ferretti, MD   1 year ago Moderate persistent asthma with acute exacerbation   [place], [place]   1 year ago Moderate persistent asthma with acute exacerbation   [facility] [name], MD

## 2019-11-03 ENCOUNTER — Telehealth: Payer: Self-pay | Admitting: Internal Medicine

## 2019-11-03 ENCOUNTER — Ambulatory Visit: Payer: Medicare Other

## 2019-11-03 NOTE — Telephone Encounter (Signed)
Called and spoke with Patient.  Patient scheduled 1000, 11/04/19 for Dupixent injection.  Nothing further at this time.

## 2019-11-04 ENCOUNTER — Ambulatory Visit: Payer: Medicare Other

## 2019-11-04 NOTE — Telephone Encounter (Signed)
Patient is requesting refill on Ambien.

## 2019-11-05 ENCOUNTER — Ambulatory Visit: Payer: Medicare Other

## 2019-11-09 ENCOUNTER — Other Ambulatory Visit: Payer: Self-pay

## 2019-11-09 ENCOUNTER — Ambulatory Visit (INDEPENDENT_AMBULATORY_CARE_PROVIDER_SITE_OTHER): Payer: Medicare Other

## 2019-11-09 DIAGNOSIS — J4541 Moderate persistent asthma with (acute) exacerbation: Secondary | ICD-10-CM

## 2019-11-09 MED ORDER — DUPILUMAB 300 MG/2ML ~~LOC~~ SOSY
300.0000 mg | PREFILLED_SYRINGE | Freq: Once | SUBCUTANEOUS | Status: AC
  Administered 2019-11-09: 300 mg via SUBCUTANEOUS

## 2019-11-09 NOTE — Progress Notes (Signed)
All questions were answered by the patient before medication was administered. Have you been hospitalized in the last 10 days? No Do you have a fever? No Do you have a cough? No Do you have a headache or sore throat? No  

## 2019-11-11 IMAGING — DX PORTABLE CHEST - 1 VIEW
1 series · 1 of 1 positions shown · non-contrast
Comparison: 10/05/2018

CLINICAL DATA: Shortness of breath

EXAM:
PORTABLE CHEST 1 VIEW

[chest ap]
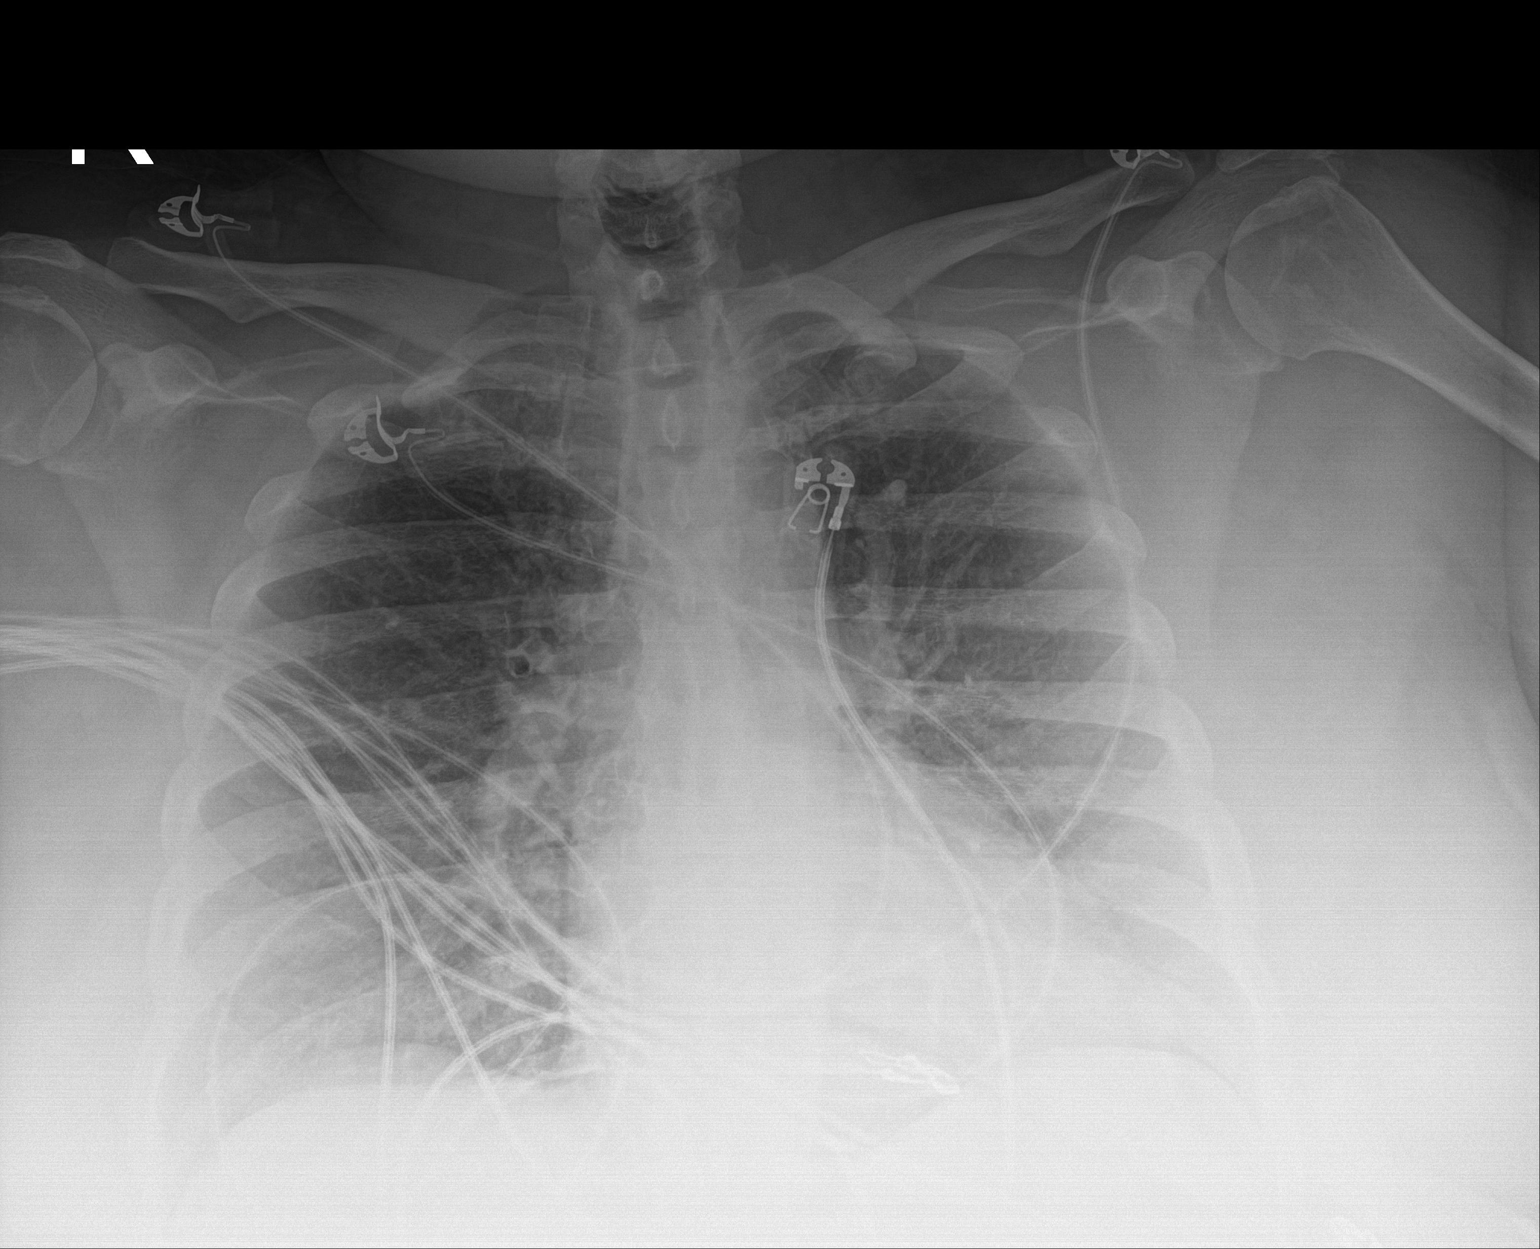

[1 of 1 positions shown; findings below may reference images not displayed]

FINDINGS: Heart and mediastinal contours are within normal limits. No focal
opacities or effusions. No acute bony abnormality.
IMPRESSION: No active disease.

## 2019-11-15 DIAGNOSIS — Z79899 Other long term (current) drug therapy: Secondary | ICD-10-CM | POA: Diagnosis not present

## 2019-11-15 DIAGNOSIS — M79604 Pain in right leg: Secondary | ICD-10-CM | POA: Diagnosis not present

## 2019-11-15 DIAGNOSIS — M79605 Pain in left leg: Secondary | ICD-10-CM | POA: Diagnosis not present

## 2019-11-15 DIAGNOSIS — E538 Deficiency of other specified B group vitamins: Secondary | ICD-10-CM | POA: Diagnosis not present

## 2019-11-15 DIAGNOSIS — N915 Oligomenorrhea, unspecified: Secondary | ICD-10-CM | POA: Diagnosis not present

## 2019-11-15 DIAGNOSIS — M170 Bilateral primary osteoarthritis of knee: Secondary | ICD-10-CM | POA: Diagnosis not present

## 2019-11-15 DIAGNOSIS — E539 Vitamin B deficiency, unspecified: Secondary | ICD-10-CM | POA: Diagnosis not present

## 2019-11-15 DIAGNOSIS — M79671 Pain in right foot: Secondary | ICD-10-CM | POA: Diagnosis not present

## 2019-11-15 DIAGNOSIS — M79672 Pain in left foot: Secondary | ICD-10-CM | POA: Diagnosis not present

## 2019-11-23 ENCOUNTER — Ambulatory Visit: Payer: Medicare Other

## 2019-11-25 ENCOUNTER — Telehealth: Payer: Self-pay | Admitting: Internal Medicine

## 2019-11-26 ENCOUNTER — Ambulatory Visit (INDEPENDENT_AMBULATORY_CARE_PROVIDER_SITE_OTHER): Payer: Medicare Other

## 2019-11-26 ENCOUNTER — Other Ambulatory Visit: Payer: Self-pay

## 2019-11-26 DIAGNOSIS — J4541 Moderate persistent asthma with (acute) exacerbation: Secondary | ICD-10-CM

## 2019-11-26 MED ORDER — DUPILUMAB 300 MG/2ML ~~LOC~~ SOSY
300.0000 mg | PREFILLED_SYRINGE | Freq: Once | SUBCUTANEOUS | Status: AC
Start: 2019-11-26 — End: 2019-11-26
  Administered 2019-11-26: 300 mg via SUBCUTANEOUS

## 2019-11-26 NOTE — Telephone Encounter (Signed)
Pt coming 7/30 at 3 o'clock.

## 2019-11-26 NOTE — Progress Notes (Signed)
All questions were answered by the patient before medication was administered. Have you been hospitalized in the last 10 days? No Do you have a fever? No Do you have a cough? No Do you have a headache or sore throat? No  

## 2019-11-26 NOTE — Telephone Encounter (Signed)
LMTCB x1 for pt.  When pt calls back, please schedule in an open time slot that is convenient for the pt.

## 2019-11-29 ENCOUNTER — Telehealth: Payer: Self-pay | Admitting: Internal Medicine

## 2019-11-29 NOTE — Telephone Encounter (Signed)
Called [facility] to set up pt's [place]. I was left on hold for over 15+ minutes while they were trying to figure out that issue with the pt's account. Will try back.

## 2019-12-02 ENCOUNTER — Other Ambulatory Visit: Payer: Self-pay | Admitting: Family Medicine

## 2019-12-02 DIAGNOSIS — G4709 Other insomnia: Secondary | ICD-10-CM

## 2019-12-02 MED ORDER — DUPIXENT 300 MG/2ML ~~LOC~~ SOSY: 300.0000 mg | PREFILLED_SYRINGE | SUBCUTANEOUS | 11 refills | Status: DC

## 2019-12-02 NOTE — Telephone Encounter (Signed)
Called [facility] to follow up on pt's [place] shipment. They are in need of a new prescription for the pt. This has been sent in. Will follow up tomorrow to set up shipment.

## 2019-12-04 ENCOUNTER — Other Ambulatory Visit: Payer: Self-pay | Admitting: Internal Medicine

## 2019-12-06 MED ORDER — DUPIXENT 300 MG/2ML ~~LOC~~ SOSY: 300.0000 mg | PREFILLED_SYRINGE | SUBCUTANEOUS | 11 refills | Status: DC

## 2019-12-06 NOTE — Telephone Encounter (Signed)
Called [facility] to follow up on pt's [place] shipment. Was advised that they did not received the pt's prescription that was sent in on 12/02/2019. This has been resent. Will follow up.

## 2019-12-08 NOTE — Telephone Encounter (Signed)
Will use sample at pt's next appointment as [facility] will not be able to ship it to [place] in time for her appointment.

## 2019-12-09 ENCOUNTER — Telehealth: Payer: Self-pay | Admitting: Pharmacy Technician

## 2019-12-09 ENCOUNTER — Telehealth: Payer: Self-pay | Admitting: Internal Medicine

## 2019-12-09 NOTE — Telephone Encounter (Signed)
ATC Patient.  LM to call back. 

## 2019-12-09 NOTE — Telephone Encounter (Signed)
Submitted a Prior Authorization request to [facility] for [place] via Cover My Meds. Will update once we receive a response.   (Key: [id number] - [id number]

## 2019-12-10 ENCOUNTER — Other Ambulatory Visit: Payer: Self-pay

## 2019-12-10 ENCOUNTER — Ambulatory Visit (INDEPENDENT_AMBULATORY_CARE_PROVIDER_SITE_OTHER): Payer: Medicare Other

## 2019-12-10 DIAGNOSIS — J4541 Moderate persistent asthma with (acute) exacerbation: Secondary | ICD-10-CM

## 2019-12-10 MED ORDER — PREDNISONE 10 MG PO TABS
20.0000 mg | ORAL_TABLET | Freq: Every day | ORAL | 0 refills | Status: AC | PRN
Start: 2019-12-10 — End: 2019-12-15

## 2019-12-10 MED ORDER — DUPILUMAB 300 MG/2ML ~~LOC~~ SOSY
300.0000 mg | PREFILLED_SYRINGE | Freq: Once | SUBCUTANEOUS | Status: AC
  Administered 2019-12-10: 300 mg via SUBCUTANEOUS

## 2019-12-10 NOTE — Telephone Encounter (Signed)
Spoke with pt when she came in for her Dupixent injection. She is needing a refill on prednisone. Pt DOES NOT take prednisone on a daily basis. States that CY gives her prednisone to "keep on hand." Reports taking a prednisone "here and there in the mornings when her asthma is bad."  Beth - please advise as CY is out of the office. Thanks.

## 2019-12-10 NOTE — Telephone Encounter (Signed)
Can you send in prednisone 20mg  qd x 5 days as needed for asthma symptoms. Keep apt with Dr. Annamaria Boots

## 2019-12-10 NOTE — Telephone Encounter (Signed)
ATC patient to let her know that RX for Prednisone was being sent into pharmacy. Left message. RX sent. Nothing further needed at this time.

## 2019-12-10 NOTE — Telephone Encounter (Signed)
LMTCB x2 for pt 

## 2019-12-10 NOTE — Progress Notes (Signed)
All questions were answered by the patient before medication was administered. Have you been hospitalized in the last 10 days? No Do you have a fever? No Do you have a cough? No Do you have a headache or sore throat? No  

## 2019-12-13 ENCOUNTER — Other Ambulatory Visit: Payer: Self-pay

## 2019-12-13 ENCOUNTER — Telehealth: Payer: Self-pay | Admitting: Internal Medicine

## 2019-12-13 ENCOUNTER — Encounter: Payer: Self-pay | Admitting: Internal Medicine

## 2019-12-13 ENCOUNTER — Ambulatory Visit (INDEPENDENT_AMBULATORY_CARE_PROVIDER_SITE_OTHER): Payer: Medicare Other | Admitting: Internal Medicine

## 2019-12-13 VITALS — BP 120/80 | HR 89 | Temp 97.2°F | Ht 66.0 in | Wt 313.8 lb

## 2019-12-13 DIAGNOSIS — J449 Chronic obstructive pulmonary disease, unspecified: Secondary | ICD-10-CM | POA: Diagnosis not present

## 2019-12-13 DIAGNOSIS — E669 Obesity, unspecified: Secondary | ICD-10-CM | POA: Diagnosis not present

## 2019-12-13 DIAGNOSIS — Z720 Tobacco use: Secondary | ICD-10-CM

## 2019-12-13 MED ORDER — FLUTICASONE FUROATE-VILANTEROL 200-25 MCG/INH IN AEPB: INHALATION_SPRAY | RESPIRATORY_TRACT | 12 refills | Status: DC

## 2019-12-13 MED ORDER — ZOLPIDEM TARTRATE 5 MG PO TABS: ORAL_TABLET | ORAL | 5 refills | Status: DC

## 2019-12-13 NOTE — Telephone Encounter (Signed)
Antonio from [place] back regarding the pa for the dupixent. Antonio can be reached at [phone]

## 2019-12-13 NOTE — Telephone Encounter (Signed)
Called [facility].  Attempted to schedule Dupixent for delivery.  Patient account has been set up, and verbal Dupixent prescription given to Marjory Lies, Pharmacist. [facility] will follow up with Patient for information. Will call back to schedule office delivery.

## 2019-12-13 NOTE — Progress Notes (Signed)
Patient ID: April [name], female    DOB: 08-Nov-1970, 49 y.o.   MRN: [id number]   HPI F former smoker followed for chronic severe asthma/ Xolair complicated by non-compliance,  OSA/ CPAP,  morbid obesity, HBP, depression, GERD, AFib/ Eliquis  NPSG 03/31/07- AHI 5.8/ hr, weight 330 lbs. She canceled planned more recent study. Office spirometry 10/24/14- severe obstructive airways disease. FVC 2.10/66%, FEV1 1.25/47%, FEV1/FVC 0.59, FEF 25-75 percent 0.64/19% Unattended HST 07/06/16- AHI 11/hour, desaturation to 74%. Office Spirometry 07/29/2016-moderately severe obstructive airways disease. FVC 2.21/68%, FEV1 1.53/57%, ratio 0.69, FEF 25-75% 0.98/34% Prolonged hosp [facility] 4/19- tracheostomy. Dupixent enrolled 10/22/2018 HST 12/26/2018- AHI 9.7/ hr, desaturation to 9.7/ hr, desaturation to 69% ------------------------------------------------------- 08/12/19- 49 year old female former smoker followed for chronic severe asthma/Xolair, Tracheostomy at [facility]151,  complicated by noncompliance, OSA/ oral appliance, morbid obesity, HBP, depression, GERD Dupixent enrolled 10/22/2018 Had failed CPAP- intol- has oral appliance Body weight today 333 lbs -----f/u OSA /COPD  Neb albuterol/ ipratropium, Ventolin HFA, Dupixent, Dulera 200, ambien 5,  No hosp in past year Breathing comfortable with little wheeze or cough and infrequent need for rescue inhaler at night. Dupixent works well. She often keeps her grandchildren who interfere with sleep at night. Ambien 5 mg no longer works. No caffeine or stimulants. Going to [facility] for PFT and Bariatric Surgery.  12/13/19- 49 year old female former smoker followed for chronic severe asthma/Xolair, Tracheostomy at [facility],  complicated by noncompliance, OSA/ oral appliance, morbid obesity, HBP, depression, GERD Dupixent enrolled 10/22/2018 Had failed CPAP- intol- has oral appliance Body weight today- Neb albuterol/ ipratropium, Ventolin HFA, Dupixent, Breo  100, ambien 5, She reports getting 2 Covid vax at [facility]. We called and they have no record of this. I didn't challenge her, but emphasized importance of covid vaccine now, flu vax in Fall. Has some left-over prednisone 5 mg tabs and says taking one occ on bad days helps control Asthma. No ER visits. Needs help continuing [place]- transportation is a problem and PA for Dupixent has been applied. Not confident she is reliable for self-injection, but may need to try it.  She wants local Bariatric Surgery referral.  Review of Systems-See HPI  + = positive Constitutional:   + weight loss, night sweats, fevers, chills, +fatigue, lassitude. HEENT:   No-  headaches, difficulty swallowing, tooth/dental problems, sore throat,    sneezing, itching, ear ache, +nasal congestion, post nasal drip,  CV:  No-   chest pain, orthopnea, PND, swelling in lower extremities, anasarca, dizziness, palpitations Resp: +shortness of breath with exertion or at rest.              productive cough,  + non-productive cough,  No- coughing up of blood.              No-   change in color of mucus. wheezing.   Skin: No-   rash or lesions. GI:  No-   heartburn, indigestion, abdominal pain, nausea, vomiting,  GU: MS:  No-   joint pain or swelling.   Neuro-     nothing unusual Psych:  No- change in mood or affect. No depression or anxiety.  No memory loss.    Objective:   Physical Exam   + wheelchair General- Alert, Oriented, Affect-appropriate, Distress- none acute, + morbid  obesity,  Skin- rash-none, lesions- none, excoriation- none Lymphadenopathy- none Head- atraumatic            Eyes- Gross vision intact, PERRLA, conjunctivae clear secretions  Ears- Hearing, canals-normal            Nose- Clear, no-Septal dev, mucus, polyps, erosion, perforation. + sniffing            Throat- Mallampati III , mucosa clear , drainage- none, tonsils- atrophic,                           + Missing  teeth, +upper  denture plate is out                  Neck- flexible , trachea+ stoma scar, no stridor , thyroid nl, carotid no bruit Chest - symmetrical excursion , unlabored           Heart/CV- RRR , no murmur , no gallop  , no rub, nl s1 s2                           - JVD- none , edema- none, stasis changes- none, varices- none           Lung- +Diminished/ clear now, unlabored.  Wheeze -None, cough-none,                                               dullness-none,  rub- none           Chest wall-  Abd-  Br/ Gen/ Rectal- Not done, not indicated Extrem- cyanosis- none, clubbing, none, atrophy- none, strength- nl Neuro- grossly intact to observation

## 2019-12-13 NOTE — Patient Instructions (Signed)
Script sent changing Breo to Breo 200   Inhale 1 puff then rinse mouth, once daily  Order- referral to Dr Cottie Banda- consider Bariatirc Surgery    Dx Obesity

## 2019-12-14 NOTE — Telephone Encounter (Signed)
Received denial, due to plan needing chart notes to show patient had history of Eosinphilia or oral steroid dependance. Faxed chart notes to Expedited Appeal department. Will update once we receive a response.  Turnaround time is 72 hours.

## 2019-12-16 DIAGNOSIS — M79671 Pain in right foot: Secondary | ICD-10-CM | POA: Diagnosis not present

## 2019-12-16 DIAGNOSIS — Z79899 Other long term (current) drug therapy: Secondary | ICD-10-CM | POA: Diagnosis not present

## 2019-12-16 DIAGNOSIS — M79672 Pain in left foot: Secondary | ICD-10-CM | POA: Diagnosis not present

## 2019-12-16 DIAGNOSIS — M79605 Pain in left leg: Secondary | ICD-10-CM | POA: Diagnosis not present

## 2019-12-16 DIAGNOSIS — E539 Vitamin B deficiency, unspecified: Secondary | ICD-10-CM | POA: Diagnosis not present

## 2019-12-16 DIAGNOSIS — M79604 Pain in right leg: Secondary | ICD-10-CM | POA: Diagnosis not present

## 2019-12-16 DIAGNOSIS — N915 Oligomenorrhea, unspecified: Secondary | ICD-10-CM | POA: Diagnosis not present

## 2019-12-16 DIAGNOSIS — M25571 Pain in right ankle and joints of right foot: Secondary | ICD-10-CM | POA: Diagnosis not present

## 2019-12-16 DIAGNOSIS — M170 Bilateral primary osteoarthritis of knee: Secondary | ICD-10-CM | POA: Diagnosis not present

## 2019-12-16 DIAGNOSIS — E538 Deficiency of other specified B group vitamins: Secondary | ICD-10-CM | POA: Diagnosis not present

## 2019-12-21 ENCOUNTER — Encounter: Payer: Self-pay | Admitting: Internal Medicine

## 2019-12-21 ENCOUNTER — Telehealth: Payer: Self-pay | Admitting: Internal Medicine

## 2019-12-21 ENCOUNTER — Telehealth: Payer: Self-pay | Admitting: Family Medicine

## 2019-12-21 MED ORDER — PREDNISONE 10 MG PO TABS: ORAL_TABLET | ORAL | 0 refills | Status: DC

## 2019-12-21 NOTE — Assessment & Plan Note (Signed)
Asthma has been much better controlled with Dupixent, markedly reducing ED visits in past 1-2 years. Compliance issues complicated by transportation problem. Plan- Change to Breo 200, seeking to get away from occasional self -med with prednisone tabs as discussed.

## 2019-12-21 NOTE — Assessment & Plan Note (Signed)
Reports still not using tobacco.  Nurse questioned odor of marijuana

## 2019-12-21 NOTE — Telephone Encounter (Signed)
Patient called to ask the doctor to send her in a script for prednisone.  She stated that she needs it for her asthma.  Please advise and call patient to discuss at [phone]

## 2019-12-21 NOTE — Telephone Encounter (Signed)
Asthma flare - will send short steroid taper  If not improving ov or ER  Please contact office for sooner follow up if symptoms do not improve or worsen or seek emergency care   Prednisone pack sent to pharm .

## 2019-12-21 NOTE — Telephone Encounter (Signed)
Called and spoke with pt letting her know the info stated by TP and that the prednisone Rx had been sent to pharmacy for her. Pt verbalized understanding. Nothing further needed.

## 2019-12-21 NOTE — Telephone Encounter (Signed)
Called and spoke with April [name].  April [name] stated she is having asthma flare, that started yesterday. April [name] stated she is having shob, and chest tightness.  April [name] has been using her Breo daily, has used albuterol inhaler 3-4 times today, and has used her nebs. April [name] stated she does not want to go to the ED, because of the Covid numbers. April [name] denies any fever,or chills, but  has a non productive cough. April [name] request Prednisone to be sent to [facility]. April [name]'s Dupixent will arrive 12/24/19, and April [name] is scheduled at 3:30 for injection.  Allergies  Allergen Reactions  . Contrast Media [Iodinated Diagnostic Agents] Itching    Ct contrast   Current Outpatient Medications on File Prior to Visit  Medication Sig Dispense Refill  . albuterol (PROAIR HFA) 108 (90 Base) MCG/ACT inhaler Inhale 2 puffs every 6 hourss if needed- rescue 18 g 12  . cloNIDine (CATAPRES) 0.3 MG tablet Take 1 tablet (0.3 mg total) by mouth at bedtime. For hot flashes 30 tablet 6  . cyclobenzaprine (FLEXERIL) 10 MG tablet Take 10 mg by mouth 3 (three) times daily as needed for muscle spasms.    . DULoxetine (CYMBALTA) 60 MG capsule TAKE ONE CAPSULE BY MOUTH EVERY DAY. 90 capsule 1  . dupilumab (DUPIXENT) 300 MG/2ML prefilled syringe Inject 300 mg into the skin every 14 (fourteen) days. 4 mL 11  . ELIQUIS 5 MG TABS tablet TAKE 1 TABLET(5 MG) BY MOUTH TWICE DAILY 60 tablet 3  . EPINEPHrine 0.3 mg/0.3 mL IJ SOAJ injection Inject 0.3 mLs (0.3 mg total) into the muscle as needed for anaphylaxis. 1 each 1  . Ferrous Sulfate (IRON) 325 (65 Fe) MG TABS Take 1 tablet (325 mg total) by mouth every morning. (April [name] taking differently: Take 1 tablet by mouth daily. ) 30 each 0  . fluticasone (FLONASE) 50 MCG/ACT nasal spray Place 1 spray into both nostrils daily. 30 g 2  . fluticasone furoate-vilanterol (BREO ELLIPTA) 200-25 MCG/INH AEPB Inhale 1 puff then rinse mouth,once daily 60 each 12  . furosemide (LASIX) 40 MG  tablet TAKE 1 TABLET(40 MG) BY MOUTH DAILY (April [name] taking differently: Take 40 mg by mouth daily. ) 90 tablet 0  . gabapentin (NEURONTIN) 600 MG tablet Take 1,200 mg by mouth 3 (three) times daily.    . hydrALAZINE (APRESOLINE) 10 MG tablet Take 1 tablet (10 mg total) by mouth every 8 (eight) hours for 30 days. 90 tablet 1  . HYDROcodone-homatropine (HYCODAN) 5-1.5 MG/5ML syrup Take 5 mLs by mouth every 6 (six) hours as needed for cough. 120 mL 0  . hydrocortisone-pramoxine (ANALPRAM HC) 2.5-1 % rectal cream Place 1 application rectally 3 (three) times daily. 30 g 1  . hydrOXYzine (ATARAX/VISTARIL) 25 MG tablet TAKE 1 TABLET(25 MG) BY MOUTH THREE TIMES DAILY AS NEEDED 90 tablet 1  . ipratropium-albuterol (DUONEB) 0.5-2.5 (3) MG/3ML SOLN Nebulize 1 ampule every 6 hours if needed 360 mL 12  . metoprolol tartrate (LOPRESSOR) 25 MG tablet Take 1 tablet (25 mg total) by mouth 2 (two) times daily. Dx: HTN 60 tablet 3  . montelukast (SINGULAIR) 10 MG tablet TAKE 1 TABLET(10 MG) BY MOUTH AT BEDTIME 90 tablet 0  . nicotine (NICODERM CQ - DOSED IN MG/24 HOURS) 21 mg/24hr patch Place 1 patch (21 mg total) onto the skin daily. 28 patch 1  . omeprazole (PRILOSEC) 20 MG capsule Take 1 capsule (20 mg total) by mouth 2 (two) times daily before a meal. 60 capsule 6  . oxyCODONE-acetaminophen (PERCOCET) 10-325  MG tablet Take 1 tablet by mouth 5 (five) times daily as needed for pain.    [name] OZEMPIC, 1 MG/DOSE, 2 MG/1.5ML SOPN Inject 1 mg into the skin every Wednesday.     . phentermine 15 MG capsule Take 15 mg by mouth daily.    . potassium chloride SA (KLOR-CON) 20 MEQ tablet Take 1 tablet (20 mEq total) by mouth daily. 30 tablet 3  . pregabalin (LYRICA) 75 MG capsule Take 75 mg by mouth 2 (two) times daily.    . Vitamin D, Ergocalciferol, (DRISDOL) 1.25 MG (50000 UT) CAPS capsule TAKE 1 CAPSULE BY MOUTH 1 TIME A WEEK (April [name] taking differently: Take 50,000 Units by mouth every Wednesday. ) 9 capsule 1  . VYVANSE 70  MG capsule Take 70 mg by mouth daily.    [name] zolpidem (AMBIEN) 5 MG tablet 1 at bedtime as needed for sleep 30 tablet 5  . [DISCONTINUED] mometasone-formoterol (DULERA) 200-5 MCG/ACT AERO Inhale 2 puffs into the lungs 2 (two) times daily. Dx: Asthma 8.8 g 3   No current facility-administered medications on file prior to visit.

## 2019-12-21 NOTE — Telephone Encounter (Signed)
Received fax from Dr Annamaria Boots.  Patient has been approved for Dupixent 12/10/19-04/28/20 from [employer]. Called [facility] to schedule Dupixent delivery.  Dupixent Order: 300mg  #2 prefilled syringe Ordered Date: 12/21/19 Expected date of arrival: 12/24/19 Ordered by: [place]: [facility]  Patient is scheduled 12/24/19 at 3:30pm

## 2019-12-21 NOTE — Assessment & Plan Note (Signed)
Strongly support any weight loss she can achieve. I think she is stable enough medically to discuss bariatric surgery. Plan - bariatric referral as requested

## 2019-12-22 NOTE — Telephone Encounter (Signed)
Received notification from [facility] regarding a prior authorization for [place]. Authorization has been APPROVED from 12/09/19 to 04/28/20.   Authorization # [id number] Phone # [phone]  Called [facility] to advise.

## 2019-12-22 NOTE — Telephone Encounter (Signed)
Will route to PCP for review. 

## 2019-12-22 NOTE — Telephone Encounter (Signed)
Prescription for prednisone was sent by pulmonary to her pharmacy yesterday.

## 2019-12-23 NOTE — Telephone Encounter (Signed)
Patient was called and informed that lung doctor sent over medication.

## 2019-12-24 ENCOUNTER — Telehealth: Payer: Self-pay | Admitting: Internal Medicine

## 2019-12-24 ENCOUNTER — Ambulatory Visit (INDEPENDENT_AMBULATORY_CARE_PROVIDER_SITE_OTHER): Payer: Medicare Other

## 2019-12-24 ENCOUNTER — Other Ambulatory Visit: Payer: Self-pay

## 2019-12-24 DIAGNOSIS — J4541 Moderate persistent asthma with (acute) exacerbation: Secondary | ICD-10-CM

## 2019-12-24 MED ORDER — DUPILUMAB 300 MG/2ML ~~LOC~~ SOSY
300.0000 mg | PREFILLED_SYRINGE | Freq: Once | SUBCUTANEOUS | Status: AC
  Administered 2019-12-24: 300 mg via SUBCUTANEOUS

## 2019-12-24 NOTE — Progress Notes (Signed)
Have you been hospitalized within the last 10 days?  No Do you have a fever?  No Do you have a cough?  Yes Do you have a headache or sore throat? No Do you have your Epi Pen visible and is it within date?  Yes   Patient is still having some cough, congestion, and sob. Patient states it is improved from earlier this week. I spoke with Dr Annamaria Boots.  Dr Annamaria Boots is ok with Patient receiving Dupixent injection today.

## 2019-12-24 NOTE — Telephone Encounter (Signed)
Dupixent Shipment Received: 300mg  #2 prefilled syringe Medication arrival date: 12/24/19 Lot #: [id number] Exp date: 03/28/2022 Received by: Elliot Dally

## 2019-12-24 NOTE — Telephone Encounter (Signed)
Spoke with Dr Annamaria Boots.  Ok for Will injection today.  Nothing further at this time.

## 2019-12-30 ENCOUNTER — Other Ambulatory Visit: Payer: Self-pay

## 2019-12-30 ENCOUNTER — Ambulatory Visit: Payer: Medicare Other | Attending: Family Medicine | Admitting: Family Medicine

## 2019-12-30 ENCOUNTER — Encounter: Payer: Self-pay | Admitting: Family Medicine

## 2019-12-30 VITALS — BP 100/69 | HR 73 | Temp 97.7°F | Wt 307.0 lb

## 2019-12-30 DIAGNOSIS — J4541 Moderate persistent asthma with (acute) exacerbation: Secondary | ICD-10-CM

## 2019-12-30 MED ORDER — AZITHROMYCIN 250 MG PO TABS: ORAL_TABLET | ORAL | 0 refills | Status: DC

## 2019-12-30 MED ORDER — PREDNISONE 20 MG PO TABS: ORAL_TABLET | ORAL | 0 refills | Status: DC

## 2019-12-30 MED ORDER — GUAIFENESIN-CODEINE 100-10 MG/5ML PO SOLN: 5.0000 mL | Freq: Three times a day (TID) | ORAL | 0 refills | Status: DC | PRN

## 2019-12-30 NOTE — Progress Notes (Signed)
Acute Office Visit  Subjective:    Patient ID: April [name], female    DOB: 1971-01-21, 49 y.o.   MRN: [id number]  Chief Complaint  Patient presents with  . Chest congestion    Pt. stated she feels congested on her chest.     HPI 49 yo female with history of asthma who is seen secondary to complaint of cough, wheezing and chest tightness for the past 2 weeks.  She reports no improvement in symptoms despite a short prednisone taper provided by her pulmonologist.  She continues to use her home respiratory medications.  She denies any fever or chills.  She does have fatigue.  She reports that over the past week her cough is now productive of yellow phlegm and she feels that she needs an antibiotic.  Patient also request a narcotic-containing cough medication due to recurrent cough which is interrupting her sleep.  Past Medical History:  Diagnosis Date  . Acanthosis nigricans   . Anxiety   . Arthritis    "knees" (04/28/2017)  . Asthma    Followed by Dr. Annamaria Boots (pulmonology); receives every other week omalizumab injections; has frequent exacerbations  . Chronic diastolic CHF (congestive heart failure) ([facility]) 01/17/2017  . COPD (chronic obstructive pulmonary disease) ([place])    PFTs in 2002, FEV1/FVC 65, no post bronchodilater test done  . Depression   . GERD (gastroesophageal reflux disease)   . Headache(784.0)    "q couple days" (04/28/2017)  . Helicobacter pylori (H. pylori) infection   . Hypertension, essential   . Insomnia   . Menorrhagia   . Morbid obesity ([place])   . OSA on CPAP    Sleep study 2008 - mild OSA, not enough events to titrate CPAP; wears CPAP now/pt on 04/28/2017  . Pneumonia X 1  . Seasonal allergies   . Shortness of breath   . Tobacco user     Past Surgical History:  Procedure Laterality Date  . CARDIOVERSION N/A 05/30/2017   Procedure: CARDIOVERSION;  Surgeon: Sanda Klein, MD;  Location: [facility];  Service: Cardiovascular;  Laterality: N/A;  . REDUCTION  MAMMAPLASTY Bilateral 09/2011  . TUBAL LIGATION  1996   bilateral    Family History  Problem Relation Age of Onset  . Hypertension Mother   . Asthma Daughter   . Cancer Paternal Aunt   . Asthma Maternal Grandmother     Social History   Socioeconomic History  . Marital status: Single    Spouse name: Not on file  . Number of children: 1  . Years of education: Not on file  . Highest education level: Not on file  Occupational History  . Occupation: CNA  Tobacco Use  . Smoking status: Former Smoker    Packs/day: 0.50    Years: 26.00    Pack years: 13.00    Types: Cigarettes    Quit date: 09/12/2014    Years since quitting: 5.3  . Smokeless tobacco: Never Used  Vaping Use  . Vaping Use: Never used  Substance and Sexual Activity  . Alcohol use: No  . Drug use: No  . Sexual activity: Never  Other Topics Concern  . 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   Social Determinants of Health   Financial Resource Strain:   . Difficulty of Paying Living Expenses: Not on file  Food Insecurity:   . Worried About Charity fundraiser in the Last Year: Not on  file  . [place] in the Last Year: Not on file  Transportation Needs:   . Lack of Transportation (Medical): Not on file  . Lack of Transportation (Non-Medical): Not on file  Physical Activity:   . Days of Exercise per Week: Not on file  . Minutes of Exercise per Session: Not on file  Stress:   . Feeling of Stress : Not on file  Social Connections:   . Frequency of Communication with Friends and Family: Not on file  . Frequency of Social Gatherings with Friends and Family: Not on file  . Attends Religious Services: Not on file  . Active Member of Clubs or Organizations: Not on file  . Attends Archivist Meetings: Not on file  . Marital Status: Not on file  Intimate Partner Violence:   . Fear of Current or Ex-Partner: Not on file  . Emotionally Abused:  Not on file  . Physically Abused: Not on file  . Sexually Abused: Not on file    Outpatient Medications Prior to Visit  Medication Sig Dispense Refill  . albuterol (PROAIR HFA) 108 (90 Base) MCG/ACT inhaler Inhale 2 puffs every 6 hourss if needed- rescue 18 g 12  . cloNIDine (CATAPRES) 0.3 MG tablet Take 1 tablet (0.3 mg total) by mouth at bedtime. For hot flashes 30 tablet 6  . cyclobenzaprine (FLEXERIL) 10 MG tablet Take 10 mg by mouth 3 (three) times daily as needed for muscle spasms.    . DULoxetine (CYMBALTA) 60 MG capsule TAKE ONE CAPSULE BY MOUTH EVERY DAY. 90 capsule 1  . dupilumab (DUPIXENT) 300 MG/2ML prefilled syringe Inject 300 mg into the skin every 14 (fourteen) days. 4 mL 11  . ELIQUIS 5 MG TABS tablet TAKE 1 TABLET(5 MG) BY MOUTH TWICE DAILY 60 tablet 3  . EPINEPHrine 0.3 mg/0.3 mL IJ SOAJ injection Inject 0.3 mLs (0.3 mg total) into the muscle as needed for anaphylaxis. 1 each 1  . Ferrous Sulfate (IRON) 325 (65 Fe) MG TABS Take 1 tablet (325 mg total) by mouth every morning. (Patient taking differently: Take 1 tablet by mouth daily. ) 30 each 0  . fluticasone (FLONASE) 50 MCG/ACT nasal spray Place 1 spray into both nostrils daily. 30 g 2  . fluticasone furoate-vilanterol (BREO ELLIPTA) 200-25 MCG/INH AEPB Inhale 1 puff then rinse mouth,once daily 60 each 12  . furosemide (LASIX) 40 MG tablet TAKE 1 TABLET(40 MG) BY MOUTH DAILY (Patient taking differently: Take 40 mg by mouth daily. ) 90 tablet 0  . gabapentin (NEURONTIN) 600 MG tablet Take 1,200 mg by mouth 3 (three) times daily.    . hydrALAZINE (APRESOLINE) 10 MG tablet Take 1 tablet (10 mg total) by mouth every 8 (eight) hours for 30 days. 90 tablet 1  . hydrocortisone-pramoxine (ANALPRAM HC) 2.5-1 % rectal cream Place 1 application rectally 3 (three) times daily. 30 g 1  . hydrOXYzine (ATARAX/VISTARIL) 25 MG tablet TAKE 1 TABLET(25 MG) BY MOUTH THREE TIMES DAILY AS NEEDED 90 tablet 1  . ipratropium-albuterol (DUONEB)  0.5-2.5 (3) MG/3ML SOLN Nebulize 1 ampule every 6 hours if needed 360 mL 12  . metoprolol tartrate (LOPRESSOR) 25 MG tablet Take 1 tablet (25 mg total) by mouth 2 (two) times daily. Dx: HTN 60 tablet 3  . montelukast (SINGULAIR) 10 MG tablet TAKE 1 TABLET(10 MG) BY MOUTH AT BEDTIME 90 tablet 0  . omeprazole (PRILOSEC) 20 MG capsule Take 1 capsule (20 mg total) by mouth 2 (two) times daily before  a meal. 60 capsule 6  . oxyCODONE-acetaminophen (PERCOCET) 10-325 MG tablet Take 1 tablet by mouth 5 (five) times daily as needed for pain.    [name] OZEMPIC, 1 MG/DOSE, 2 MG/1.5ML SOPN Inject 1 mg into the skin every Wednesday.     . phentermine 15 MG capsule Take 15 mg by mouth daily.    . potassium chloride SA (KLOR-CON) 20 MEQ tablet Take 1 tablet (20 mEq total) by mouth daily. 30 tablet 3  . predniSONE (DELTASONE) 10 MG tablet 4 tabs for 2 days, then 3 tabs for 2 days, 2 tabs for 2 days, then 1 tab for 2 days, then stop 20 tablet 0  . pregabalin (LYRICA) 75 MG capsule Take 75 mg by mouth 2 (two) times daily.    [name] VYVANSE 70 MG capsule Take 70 mg by mouth daily.    [name] zolpidem (AMBIEN) 5 MG tablet 1 at bedtime as needed for sleep 30 tablet 5  . HYDROcodone-homatropine (HYCODAN) 5-1.5 MG/5ML syrup Take 5 mLs by mouth every 6 (six) hours as needed for cough. (Patient not taking: Reported on 12/30/2019) 120 mL 0  . nicotine (NICODERM CQ - DOSED IN MG/24 HOURS) 21 mg/24hr patch Place 1 patch (21 mg total) onto the skin daily. (Patient not taking: Reported on 12/30/2019) 28 patch 1  . Vitamin D, Ergocalciferol, (DRISDOL) 1.25 MG (50000 UT) CAPS capsule TAKE 1 CAPSULE BY MOUTH 1 TIME A WEEK (Patient taking differently: Take 50,000 Units by mouth every Wednesday. ) 9 capsule 1   No facility-administered medications prior to visit.    Allergies  Allergen Reactions  . Contrast Media [Iodinated Diagnostic Agents] Itching    Ct contrast    Review of Systems  Constitutional: Positive for fatigue. Negative for chills  and fever.  HENT: Negative for ear pain, sore throat and trouble swallowing.   Respiratory: Positive for cough, chest tightness, shortness of breath and wheezing.   Gastrointestinal: Negative for abdominal pain, constipation, diarrhea and nausea.  Genitourinary: Negative for dysuria and flank pain.  Skin: Negative for rash.  Neurological: Positive for headaches. Negative for dizziness.  Hematological: Negative for adenopathy. Does not bruise/bleed easily.  Psychiatric/Behavioral: Negative for suicidal ideas. The patient is not nervous/anxious.        Objective:    Physical Exam Vitals and nursing note reviewed.  Constitutional:      General: She is not in acute distress.    Appearance: She is obese.  HENT:     Nose: Congestion present. No rhinorrhea.     Mouth/Throat:     Pharynx: Posterior oropharyngeal erythema present. No oropharyngeal exudate.  Cardiovascular:     Rate and Rhythm: Normal rate and regular rhythm.  Pulmonary:     Effort: Pulmonary effort is normal. No respiratory distress.     Breath sounds: Wheezing present.  Abdominal:     Tenderness: There is no abdominal tenderness. There is no guarding.  Musculoskeletal:     Cervical back: Normal range of motion and neck supple.  Lymphadenopathy:     Cervical: No cervical adenopathy.  Skin:    General: Skin is warm and dry.  Neurological:     General: No focal deficit present.     Mental Status: She is alert and oriented to person, place, and time.     BP 100/69 (BP Location: Left Arm, Patient Position: Sitting, Cuff Size: Large)   Pulse 73   Temp 97.7 F (36.5 C) (Temporal)   Wt (!) 307 lb (139.3 kg)  SpO2 98%   BMI 49.55 kg/m  Wt Readings from Last 3 Encounters:  12/30/19 (!) 307 lb (139.3 kg)  12/13/19 (!) 313 lb 12.8 oz (142.3 kg)  08/12/19 (!) 333 lb (151 kg)    Health Maintenance Due  Topic Date Due  . FOOT EXAM  Never done  . COVID-19 Vaccine (1) Never done  . PAP SMEAR-Modifier  Never done    . OPHTHALMOLOGY EXAM  07/08/2019  . HEMOGLOBIN A1C  09/21/2019  . INFLUENZA VACCINE  11/28/2019      Lab Results  Component Value Date   TSH 3.090 01/22/2018   Lab Results  Component Value Date   WBC 29.1 (H) 02/20/2019   HGB 12.2 02/20/2019   HCT 36.0 02/20/2019   MCV 87.1 02/20/2019   PLT 399 02/20/2019   Lab Results  Component Value Date   NA 135 02/20/2019   K 3.6 02/20/2019   CO2 22 02/20/2019   GLUCOSE 170 (H) 02/20/2019   BUN 10 02/20/2019   CREATININE 0.73 02/20/2019   BILITOT 0.3 02/17/2019   ALKPHOS 69 02/17/2019   AST 19 02/17/2019   ALT 24 02/17/2019   PROT 6.5 02/17/2019   ALBUMIN 3.1 (L) 02/17/2019   CALCIUM 8.4 (L) 02/20/2019   ANIONGAP 14 02/20/2019   Lab Results  Component Value Date   CHOL 183 10/06/2009   Lab Results  Component Value Date   HDL 47 10/06/2009   Lab Results  Component Value Date   LDLCALC 123 (H) 10/06/2009   Lab Results  Component Value Date   TRIG 411 (H) 08/14/2017   Lab Results  Component Value Date   CHOLHDL 3.9 Ratio 10/06/2009   Lab Results  Component Value Date   HGBA1C 5.6 (A) 03/24/2019       Assessment & Plan:  1. Moderate persistent asthma with exacerbation Patient with greater than 2 weeks of productive cough and wheezing due to asthma exacerbation with possible bronchitis/atypical pneumonia s/p failure of prior steroid taper. She will be placed on a prednisone taper and is encouraged to continue her current home medications including albuterol nebulizer treatments every 6 hours for the next 3 days as then as needed. Will place patient on an azithromycin Z-pack for coverage of atypical pneumonia as patient endorses continued productive cough for which she feels the need for antibiotic coverage. Also, per patient request, narcotic containing cough medication Robitussin AC provided. She has been asked to follow-up her next week with her PCP or with her pulmonologist if her symptoms are not improving and  seek ED evaluation if she any acute worsening of her symptoms. She is encouraged to rest and remain well hydrated.  - predniSONE (DELTASONE) 20 MG tablet; 3 pills today then 2 pills daily x 2 days, 1 pill daily x 2 days then 1/2 pill daily x 4 days  Dispense: 11 tablet; Refill: 0 - azithromycin (ZITHROMAX) 250 MG tablet; 2 pills today then 1 pill daily for 4 days  Dispense: 6 tablet; Refill: 0 - guaiFENesin-codeine 100-10 MG/5ML syrup; Take 5 mLs by mouth 3 (three) times daily as needed for cough.  Dispense: 120 mL; Refill: 0    Return in about 1 week (around 01/06/2020) for ashtma exacerbation; f/u with PCP.   Antony Blackbird, MD

## 2020-01-05 ENCOUNTER — Other Ambulatory Visit: Payer: Self-pay | Admitting: Surgical Oncology

## 2020-01-05 DIAGNOSIS — K449 Diaphragmatic hernia without obstruction or gangrene: Secondary | ICD-10-CM

## 2020-01-07 ENCOUNTER — Telehealth: Payer: Self-pay | Admitting: Internal Medicine

## 2020-01-07 ENCOUNTER — Ambulatory Visit
Admission: RE | Admit: 2020-01-07 | Discharge: 2020-01-07 | Disposition: A | Discharge status: Home / Self Care | Payer: Medicare Other | Source: Ambulatory Visit | Attending: Surgical Oncology | Admitting: Surgical Oncology

## 2020-01-07 ENCOUNTER — Other Ambulatory Visit: Payer: Self-pay | Admitting: Surgical Oncology

## 2020-01-07 DIAGNOSIS — Z01818 Encounter for other preprocedural examination: Secondary | ICD-10-CM | POA: Diagnosis not present

## 2020-01-07 DIAGNOSIS — K449 Diaphragmatic hernia without obstruction or gangrene: Secondary | ICD-10-CM

## 2020-01-07 DIAGNOSIS — Z9884 Bariatric surgery status: Secondary | ICD-10-CM | POA: Diagnosis not present

## 2020-01-08 NOTE — Telephone Encounter (Signed)
Dupixent Order: 300mg  #2 prefilled syringe Ordered Date: 01/08/20 Expected date of arrival: 01/11/20 Ordered by: [place]: [facility]

## 2020-01-11 ENCOUNTER — Other Ambulatory Visit: Payer: Self-pay

## 2020-01-11 ENCOUNTER — Ambulatory Visit (INDEPENDENT_AMBULATORY_CARE_PROVIDER_SITE_OTHER): Payer: Medicare Other

## 2020-01-11 DIAGNOSIS — J4541 Moderate persistent asthma with (acute) exacerbation: Secondary | ICD-10-CM

## 2020-01-11 DIAGNOSIS — Z23 Encounter for immunization: Secondary | ICD-10-CM | POA: Diagnosis not present

## 2020-01-11 MED ORDER — DUPILUMAB 300 MG/2ML ~~LOC~~ SOSY
300.0000 mg | PREFILLED_SYRINGE | Freq: Once | SUBCUTANEOUS | Status: AC
  Administered 2020-01-11: 300 mg via SUBCUTANEOUS

## 2020-01-11 NOTE — Progress Notes (Signed)
Have you been hospitalized within the last 10 days?  No Do you have a fever?  No Do you have a cough?  No Do you have a headache or sore throat? No Do you have your Epi Pen visible and is it within date?  Yes 

## 2020-01-11 NOTE — Telephone Encounter (Signed)
Dupixent Shipment Received: 300mg  #2 prefilled syringe Medication arrival date: 01/11/20 Lot #: [id number] Exp date: 04/28/2022 Received by: Elliot Dally

## 2020-01-13 DIAGNOSIS — M79604 Pain in right leg: Secondary | ICD-10-CM | POA: Diagnosis not present

## 2020-01-13 DIAGNOSIS — M170 Bilateral primary osteoarthritis of knee: Secondary | ICD-10-CM | POA: Diagnosis not present

## 2020-01-13 DIAGNOSIS — M79672 Pain in left foot: Secondary | ICD-10-CM | POA: Diagnosis not present

## 2020-01-13 DIAGNOSIS — M79605 Pain in left leg: Secondary | ICD-10-CM | POA: Diagnosis not present

## 2020-01-13 DIAGNOSIS — M79671 Pain in right foot: Secondary | ICD-10-CM | POA: Diagnosis not present

## 2020-01-13 DIAGNOSIS — Z79899 Other long term (current) drug therapy: Secondary | ICD-10-CM | POA: Diagnosis not present

## 2020-01-19 ENCOUNTER — Telehealth: Payer: Self-pay

## 2020-01-19 ENCOUNTER — Ambulatory Visit: Payer: Medicare Other | Admitting: Family Medicine

## 2020-01-19 NOTE — Telephone Encounter (Signed)
Patient is requesting a handicap place card.   The form will be placed in folder.

## 2020-01-20 NOTE — Telephone Encounter (Signed)
Will address

## 2020-01-23 ENCOUNTER — Other Ambulatory Visit: Payer: Self-pay | Admitting: Family Medicine

## 2020-01-23 DIAGNOSIS — G629 Polyneuropathy, unspecified: Secondary | ICD-10-CM

## 2020-01-25 ENCOUNTER — Ambulatory Visit: Payer: Medicare Other

## 2020-01-26 DIAGNOSIS — M79604 Pain in right leg: Secondary | ICD-10-CM | POA: Diagnosis not present

## 2020-01-26 DIAGNOSIS — M170 Bilateral primary osteoarthritis of knee: Secondary | ICD-10-CM | POA: Diagnosis not present

## 2020-01-26 DIAGNOSIS — M79672 Pain in left foot: Secondary | ICD-10-CM | POA: Diagnosis not present

## 2020-01-26 DIAGNOSIS — M79605 Pain in left leg: Secondary | ICD-10-CM | POA: Diagnosis not present

## 2020-01-26 DIAGNOSIS — Z79899 Other long term (current) drug therapy: Secondary | ICD-10-CM | POA: Diagnosis not present

## 2020-01-26 DIAGNOSIS — M79671 Pain in right foot: Secondary | ICD-10-CM | POA: Diagnosis not present

## 2020-01-27 ENCOUNTER — Telehealth: Payer: Self-pay | Admitting: Internal Medicine

## 2020-01-27 NOTE — Telephone Encounter (Signed)
Spoke with Patient.  Patient scheduled 01/28/20 at 3pm.

## 2020-01-28 ENCOUNTER — Ambulatory Visit (INDEPENDENT_AMBULATORY_CARE_PROVIDER_SITE_OTHER): Payer: Medicare Other

## 2020-01-28 ENCOUNTER — Other Ambulatory Visit: Payer: Self-pay

## 2020-01-28 DIAGNOSIS — J4541 Moderate persistent asthma with (acute) exacerbation: Secondary | ICD-10-CM

## 2020-01-28 MED ORDER — DUPILUMAB 300 MG/2ML ~~LOC~~ SOSY
300.0000 mg | PREFILLED_SYRINGE | Freq: Once | SUBCUTANEOUS | Status: AC
  Administered 2020-01-28: 300 mg via SUBCUTANEOUS

## 2020-01-28 NOTE — Progress Notes (Signed)
Have you been hospitalized within the last 10 days?  No Do you have a fever?  No Do you have a cough?  No Do you have a headache or sore throat? No Do you have your Epi Pen visible and is it within date?  Yes 

## 2020-02-02 ENCOUNTER — Telehealth: Payer: Self-pay | Admitting: Internal Medicine

## 2020-02-03 DIAGNOSIS — M79671 Pain in right foot: Secondary | ICD-10-CM | POA: Diagnosis not present

## 2020-02-03 DIAGNOSIS — M79605 Pain in left leg: Secondary | ICD-10-CM | POA: Diagnosis not present

## 2020-02-03 DIAGNOSIS — Z79899 Other long term (current) drug therapy: Secondary | ICD-10-CM | POA: Diagnosis not present

## 2020-02-03 DIAGNOSIS — M170 Bilateral primary osteoarthritis of knee: Secondary | ICD-10-CM | POA: Diagnosis not present

## 2020-02-03 DIAGNOSIS — M79604 Pain in right leg: Secondary | ICD-10-CM | POA: Diagnosis not present

## 2020-02-03 DIAGNOSIS — M79672 Pain in left foot: Secondary | ICD-10-CM | POA: Diagnosis not present

## 2020-02-03 NOTE — Telephone Encounter (Signed)
Dupixent Order: 300mg  #2 prefilled syringe Ordered Date: 02/03/20 Expected date of arrival: 02/08/20 Ordered by: [place]: [facility]

## 2020-02-04 ENCOUNTER — Other Ambulatory Visit: Payer: Self-pay | Admitting: Family Medicine

## 2020-02-04 DIAGNOSIS — J4541 Moderate persistent asthma with (acute) exacerbation: Secondary | ICD-10-CM

## 2020-02-08 NOTE — Telephone Encounter (Signed)
Dupixent Shipment Received: 300mg  #2 prefilled syringe Medication arrival date: 02/08/20 Lot #: [id number] Exp date: 05/29/2022 Received by: Elliot Dally

## 2020-02-10 ENCOUNTER — Other Ambulatory Visit: Payer: Self-pay | Admitting: Family Medicine

## 2020-02-10 DIAGNOSIS — R232 Flushing: Secondary | ICD-10-CM

## 2020-02-10 NOTE — Telephone Encounter (Signed)
Dose change on 08/05/19 to Clonidine hcl 0.3 mg tabs. This refill is not appropriate. Refusing at this time.

## 2020-02-11 ENCOUNTER — Other Ambulatory Visit: Payer: Self-pay

## 2020-02-11 ENCOUNTER — Ambulatory Visit (INDEPENDENT_AMBULATORY_CARE_PROVIDER_SITE_OTHER): Payer: Medicare Other

## 2020-02-11 DIAGNOSIS — J4541 Moderate persistent asthma with (acute) exacerbation: Secondary | ICD-10-CM

## 2020-02-11 MED ORDER — DUPILUMAB 300 MG/2ML ~~LOC~~ SOSY
300.0000 mg | PREFILLED_SYRINGE | Freq: Once | SUBCUTANEOUS | Status: AC
  Administered 2020-02-11: 300 mg via SUBCUTANEOUS

## 2020-02-11 NOTE — Progress Notes (Signed)
Have you been hospitalized within the last 10 days?  No Do you have a fever?  No Do you have a cough?  No Do you have a headache or sore throat? No Do you have your Epi Pen visible and is it within date?  Yes 

## 2020-02-23 ENCOUNTER — Other Ambulatory Visit: Payer: Self-pay

## 2020-02-23 ENCOUNTER — Ambulatory Visit: Payer: Medicare Other | Attending: Family Medicine | Admitting: Family Medicine

## 2020-02-23 ENCOUNTER — Encounter: Payer: Self-pay | Admitting: Family Medicine

## 2020-02-23 DIAGNOSIS — J4541 Moderate persistent asthma with (acute) exacerbation: Secondary | ICD-10-CM

## 2020-02-23 DIAGNOSIS — G629 Polyneuropathy, unspecified: Secondary | ICD-10-CM | POA: Diagnosis not present

## 2020-02-23 DIAGNOSIS — I480 Paroxysmal atrial fibrillation: Secondary | ICD-10-CM | POA: Diagnosis not present

## 2020-02-23 DIAGNOSIS — F419 Anxiety disorder, unspecified: Secondary | ICD-10-CM | POA: Diagnosis not present

## 2020-02-23 DIAGNOSIS — R232 Flushing: Secondary | ICD-10-CM | POA: Diagnosis not present

## 2020-02-23 DIAGNOSIS — I10 Essential (primary) hypertension: Secondary | ICD-10-CM

## 2020-02-23 DIAGNOSIS — F32A Depression, unspecified: Secondary | ICD-10-CM

## 2020-02-23 DIAGNOSIS — K219 Gastro-esophageal reflux disease without esophagitis: Secondary | ICD-10-CM

## 2020-02-23 MED ORDER — FUROSEMIDE 40 MG PO TABS
40.0000 mg | ORAL_TABLET | Freq: Every day | ORAL | 6 refills | Status: DC
Start: 2020-02-23 — End: 2021-10-18

## 2020-02-23 MED ORDER — DULOXETINE HCL 60 MG PO CPEP: 60.0000 mg | ORAL_CAPSULE | Freq: Every day | ORAL | 6 refills | Status: DC

## 2020-02-23 MED ORDER — METOPROLOL TARTRATE 25 MG PO TABS: 25.0000 mg | ORAL_TABLET | Freq: Two times a day (BID) | ORAL | 6 refills | Status: DC

## 2020-02-23 MED ORDER — OMEPRAZOLE 20 MG PO CPDR: 20.0000 mg | DELAYED_RELEASE_CAPSULE | Freq: Two times a day (BID) | ORAL | 6 refills | Status: DC

## 2020-02-23 MED ORDER — HYDROXYZINE HCL 25 MG PO TABS: ORAL_TABLET | ORAL | 1 refills | Status: DC

## 2020-02-23 MED ORDER — APIXABAN 5 MG PO TABS: ORAL_TABLET | ORAL | 6 refills | Status: DC

## 2020-02-23 MED ORDER — MONTELUKAST SODIUM 10 MG PO TABS: 10.0000 mg | ORAL_TABLET | Freq: Every day | ORAL | 6 refills | Status: DC

## 2020-02-23 MED ORDER — CLONIDINE HCL 0.3 MG PO TABS: 0.3000 mg | ORAL_TABLET | Freq: Every day | ORAL | 6 refills | Status: DC

## 2020-02-23 NOTE — Progress Notes (Signed)
States that she needs refill on medication for hot flashes.  Has been having abdominal pain. Bad smell comes out when she burps.

## 2020-02-23 NOTE — Progress Notes (Signed)
Virtual Visit via Telephone Note  I connected with April [name], on 02/23/2020 at 2:50 PM by telephone due to the COVID-19 pandemic and verified that I am speaking with the correct person using two identifiers.   Consent: I discussed the limitations, risks, security and privacy concerns of performing an evaluation and management service by telephone and the availability of in person appointments. I also discussed with the patient that there may be a patient responsible charge related to this service. The patient expressed understanding and agreed to proceed.   Location of Patient: Home  Location of Provider: Clinic   Persons participating in Telemedicine visit: Alvin N Kysha Muralles [place]-CMA Dr. 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   Ran out of Clonidine for hot flashes and since then her hot flashes have worsened and she is requesting a refill today. Complains of bad smell when she burps and has mid abdominal pain after meals. Sometimes has vomiting but this is not frequent.  Denies presence of constipation, diarrhea and is able to pass flatus.  She is currently on a PPI which should be taking twice daily however she has been taking it once daily.  Has not been taking Eliquis for her paroxysmal A. fib and does not give any reason as to why she has not.  Still followed by [facility] Management at [facility] management. Also seeing the weight loss Clinic. Her asthma has been stable with no recent flares and she is doing well on apixaban. Past Medical History:  Diagnosis Date  . Acanthosis nigricans   . Anxiety   . Arthritis    "knees" (04/28/2017)  .  Asthma    Followed by Dr. Annamaria Boots (pulmonology); receives every other week omalizumab injections; has frequent exacerbations  . Chronic diastolic CHF (congestive heart failure) ([place]) 01/17/2017  . COPD (chronic obstructive pulmonary disease) ([place])    PFTs in 2002, FEV1/FVC 65, no post bronchodilater test done  . Depression   . GERD (gastroesophageal reflux disease)   . Headache(784.0)    "q couple days" (04/28/2017)  . Helicobacter pylori (H. pylori) infection   . Hypertension, essential   . Insomnia   . Menorrhagia   . Morbid obesity ([place])   . OSA on CPAP    Sleep study 2008 - mild OSA, not enough events to titrate CPAP; wears CPAP now/pt on 04/28/2017  . Pneumonia X 1  . Seasonal allergies   . Shortness of breath   . Tobacco user    Allergies  Allergen Reactions  . Contrast Media [Iodinated Diagnostic Agents] Itching    Ct contrast    Current Outpatient Medications on File Prior to Visit  Medication Sig Dispense Refill  . albuterol (PROAIR HFA) 108 (90 Base) MCG/ACT inhaler Inhale 2 puffs every 6 hourss if needed- rescue 18 g 12  . cloNIDine (CATAPRES) 0.3 MG tablet Take 1 tablet (0.3 mg total) by mouth at bedtime. For hot flashes 30 tablet 6  . cyclobenzaprine (FLEXERIL) 10 MG tablet Take 10 mg by mouth 3 (three) times daily as needed for muscle spasms.    . DULoxetine (CYMBALTA) 60 MG capsule TAKE ONE CAPSULE BY MOUTH EVERY DAY. 90 capsule 1  . dupilumab (DUPIXENT) 300 MG/2ML prefilled syringe Inject 300 mg  into the skin every 14 (fourteen) days. 4 mL 11  . ELIQUIS 5 MG TABS tablet TAKE 1 TABLET(5 MG) BY MOUTH TWICE DAILY 60 tablet 3  . EPINEPHrine 0.3 mg/0.3 mL IJ SOAJ injection Inject 0.3 mLs (0.3 mg total) into the muscle as needed for anaphylaxis. 1 each 1  . Ferrous Sulfate (IRON) 325 (65 Fe) MG TABS Take 1 tablet (325 mg total) by mouth every morning. (Patient taking differently: Take 1 tablet by mouth daily. ) 30 each 0  . fluticasone (FLONASE) 50 MCG/ACT nasal spray  Place 1 spray into both nostrils daily. 30 g 2  . fluticasone furoate-vilanterol (BREO ELLIPTA) 200-25 MCG/INH AEPB Inhale 1 puff then rinse mouth,once daily 60 each 12  . furosemide (LASIX) 40 MG tablet TAKE 1 TABLET(40 MG) BY MOUTH DAILY (Patient taking differently: Take 40 mg by mouth daily. ) 90 tablet 0  . gabapentin (NEURONTIN) 600 MG tablet Take 1,200 mg by mouth 3 (three) times daily.    [name] guaiFENesin-codeine 100-10 MG/5ML syrup Take 5 mLs by mouth 3 (three) times daily as needed for cough. 120 mL 0  . hydrALAZINE (APRESOLINE) 10 MG tablet Take 1 tablet (10 mg total) by mouth every 8 (eight) hours for 30 days. 90 tablet 1  . hydrocortisone-pramoxine (ANALPRAM HC) 2.5-1 % rectal cream Place 1 application rectally 3 (three) times daily. 30 g 1  . hydrOXYzine (ATARAX/VISTARIL) 25 MG tablet TAKE 1 TABLET(25 MG) BY MOUTH THREE TIMES DAILY AS NEEDED 90 tablet 1  . ipratropium-albuterol (DUONEB) 0.5-2.5 (3) MG/3ML SOLN Nebulize 1 ampule every 6 hours if needed 360 mL 12  . metoprolol tartrate (LOPRESSOR) 25 MG tablet Take 1 tablet (25 mg total) by mouth 2 (two) times daily. Dx: HTN 60 tablet 3  . montelukast (SINGULAIR) 10 MG tablet TAKE 1 TABLET BY MOUTH EVERY NIGHT AT BEDTIME 30 tablet 0  . omeprazole (PRILOSEC) 20 MG capsule Take 1 capsule (20 mg total) by mouth 2 (two) times daily before a meal. 60 capsule 6  . oxyCODONE-acetaminophen (PERCOCET) 10-325 MG tablet Take 1 tablet by mouth 5 (five) times daily as needed for pain.    [name] OZEMPIC, 1 MG/DOSE, 2 MG/1.5ML SOPN Inject 1 mg into the skin every Wednesday.     . phentermine 15 MG capsule Take 15 mg by mouth daily.    . potassium chloride SA (KLOR-CON) 20 MEQ tablet Take 1 tablet (20 mEq total) by mouth daily. 30 tablet 3  . pregabalin (LYRICA) 75 MG capsule Take 75 mg by mouth 2 (two) times daily.    . Vitamin D, Ergocalciferol, (DRISDOL) 1.25 MG (50000 UT) CAPS capsule TAKE 1 CAPSULE BY MOUTH 1 TIME A WEEK (Patient taking differently: Take  50,000 Units by mouth every Wednesday. ) 9 capsule 1  . VYVANSE 70 MG capsule Take 70 mg by mouth daily.    [name] zolpidem (AMBIEN) 5 MG tablet 1 at bedtime as needed for sleep 30 tablet 5  . azithromycin (ZITHROMAX) 250 MG tablet 2 pills today then 1 pill daily for 4 days (Patient not taking: Reported on 02/23/2020) 6 tablet 0  . nicotine (NICODERM CQ - DOSED IN MG/24 HOURS) 21 mg/24hr patch Place 1 patch (21 mg total) onto the skin daily. (Patient not taking: Reported on 12/30/2019) 28 patch 1  . predniSONE (DELTASONE) 20 MG tablet 3 pills today then 2 pills daily x 2 days, 1 pill daily x 2 days then 1/2 pill daily x 4 days (Patient not taking: Reported on 02/23/2020)  11 tablet 0  . [DISCONTINUED] mometasone-formoterol (DULERA) 200-5 MCG/ACT AERO Inhale 2 puffs into the lungs 2 (two) times daily. Dx: Asthma 8.8 g 3   No current facility-administered medications on file prior to visit.    Observations/Objective: Awake, alert, oriented x3 Not in acute distress  Assessment and Plan: 1. Hot flashes Uncontrolled due to running out of clonidine which I have refilled - cloNIDine (CATAPRES) 0.3 MG tablet; Take 1 tablet (0.3 mg total) by mouth at bedtime. For hot flashes  Dispense: 30 tablet; Refill: 6  2. Neuropathy Stable - DULoxetine (CYMBALTA) 60 MG capsule; Take 1 capsule (60 mg total) by mouth daily.  Dispense: 30 capsule; Refill: 6  3. AF (paroxysmal atrial fibrillation) ([place]) Has not been compliant with Eliquis and I have advised her to resume this - apixaban (ELIQUIS) 5 MG TABS tablet; TAKE 1 TABLET(5 MG) BY MOUTH TWICE DAILY  Dispense: 60 tablet; Refill: 6  4. Anxiety and depression Stable - hydrOXYzine (ATARAX/VISTARIL) 25 MG tablet; TAKE 1 TABLET(25 MG) BY MOUTH THREE TIMES DAILY AS NEEDED  Dispense: 90 tablet; Refill: 1  5. Essential hypertension Controlled Counseled on blood pressure goal of less than 130/80, low-sodium, DASH diet, medication compliance, 150 minutes of moderate  intensity exercise per week. Discussed medication compliance, adverse effects. - metoprolol tartrate (LOPRESSOR) 25 MG tablet; Take 1 tablet (25 mg total) by mouth 2 (two) times daily. Dx: HTN  Dispense: 60 tablet; Refill: 6  6. Moderate persistent asthma with acute exacerbation Stable Continue Dupixent - montelukast (SINGULAIR) 10 MG tablet; Take 1 tablet (10 mg total) by mouth at bedtime.  Dispense: 30 tablet; Refill: 6  7. Gastroesophageal reflux disease without esophagitis Uncontrolled This could explain her excessive burping Advised to resume omeprazole, avoid late meals, avoid recumbency up to 2 hours post meals - omeprazole (PRILOSEC) 20 MG capsule; Take 1 capsule (20 mg total) by mouth 2 (two) times daily before a meal.  Dispense: 60 capsule; Refill: 6   Follow Up Instructions: 3 months for chronic disease management   I discussed the assessment and treatment plan with the patient. The patient was provided an opportunity to ask questions and all were answered. The patient agreed with the plan and demonstrated an understanding of the instructions.   The patient was advised to call back or seek an in-person evaluation if the symptoms worsen or if the condition fails to improve as anticipated.     I provided 22 minutes total of non-face-to-face time during this encounter including median intraservice time, reviewing previous notes, investigations, ordering medications, medical decision making, coordinating care and patient verbalized understanding at the end of the visit.     [name], MD, FAAFP. [facility] [place] [facility] [place], [place]   02/23/2020, 2:50 PM

## 2020-02-25 ENCOUNTER — Ambulatory Visit: Payer: Medicare Other

## 2020-02-28 ENCOUNTER — Other Ambulatory Visit: Payer: Self-pay

## 2020-02-28 ENCOUNTER — Ambulatory Visit (INDEPENDENT_AMBULATORY_CARE_PROVIDER_SITE_OTHER): Payer: Medicare Other

## 2020-02-28 DIAGNOSIS — J4541 Moderate persistent asthma with (acute) exacerbation: Secondary | ICD-10-CM | POA: Diagnosis not present

## 2020-02-28 MED ORDER — DUPILUMAB 300 MG/2ML ~~LOC~~ SOSY
300.0000 mg | PREFILLED_SYRINGE | Freq: Once | SUBCUTANEOUS | Status: AC
  Administered 2020-02-28: 300 mg via SUBCUTANEOUS

## 2020-02-28 NOTE — Progress Notes (Signed)
Have you been hospitalized within the last 10 days?  No Do you have a fever?  No Do you have a cough?  No Do you have a headache or sore throat? No Do you have your Epi Pen visible and is it within date?  Yes 

## 2020-03-01 DIAGNOSIS — Z79899 Other long term (current) drug therapy: Secondary | ICD-10-CM | POA: Diagnosis not present

## 2020-03-01 DIAGNOSIS — M79605 Pain in left leg: Secondary | ICD-10-CM | POA: Diagnosis not present

## 2020-03-01 DIAGNOSIS — M79671 Pain in right foot: Secondary | ICD-10-CM | POA: Diagnosis not present

## 2020-03-01 DIAGNOSIS — M79672 Pain in left foot: Secondary | ICD-10-CM | POA: Diagnosis not present

## 2020-03-01 DIAGNOSIS — M170 Bilateral primary osteoarthritis of knee: Secondary | ICD-10-CM | POA: Diagnosis not present

## 2020-03-01 DIAGNOSIS — M79604 Pain in right leg: Secondary | ICD-10-CM | POA: Diagnosis not present

## 2020-03-01 IMAGING — DX DG CHEST 1V PORT
1 series · 1 of 1 positions shown · non-contrast
Comparison: Prior radiograph from 10/26/2018.

CLINICAL DATA: Initial evaluation for acute shortness of breath,
history of asthma.

EXAM:
PORTABLE CHEST 1 VIEW

[chest]
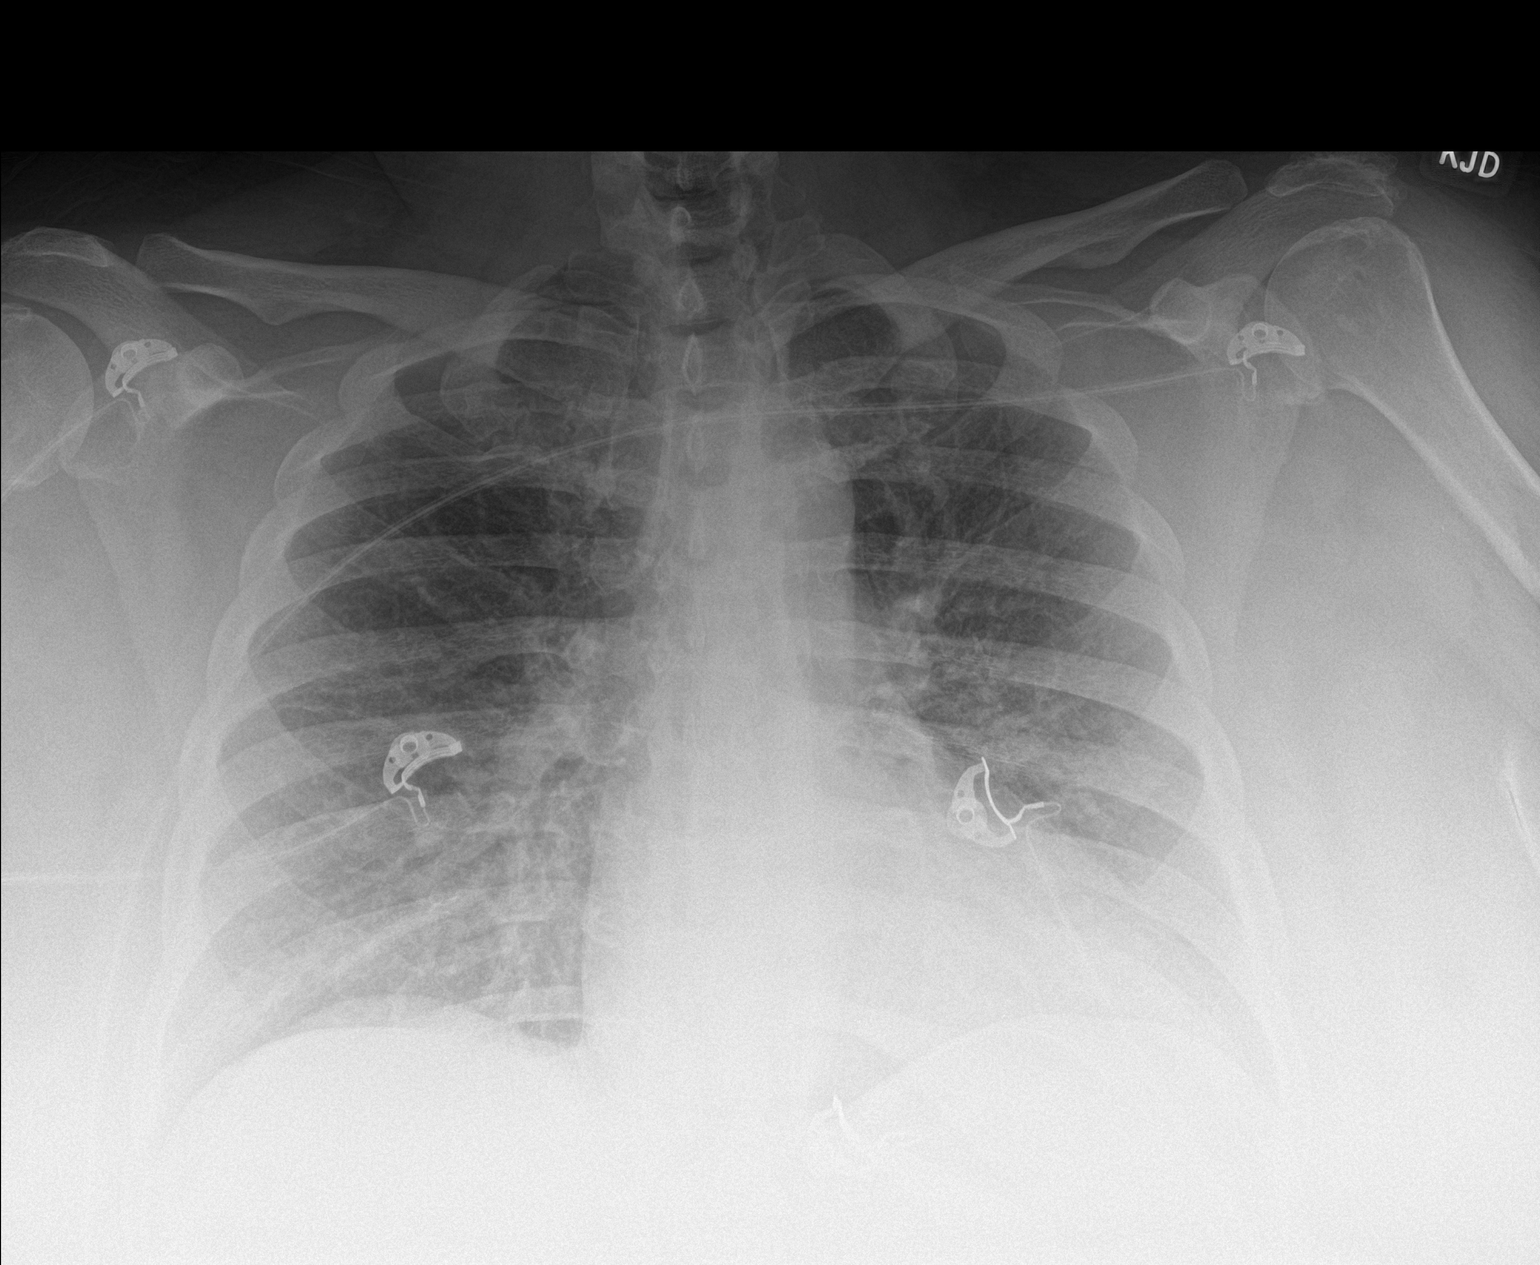

[1 of 1 positions shown; findings below may reference images not displayed]

FINDINGS: Cardiac and mediastinal silhouettes are stable in size and contour,
and remain within normal limits.

Lungs mildly hypoinflated. Prominent diffuse peribronchial
thickening, suspected be related to history of asthma. No
consolidative airspace disease. No edema or effusion. No
pneumothorax.

No acute osseous finding.
IMPRESSION: Prominent diffuse peribronchial thickening, suspected to be related
to history of asthma/reactive airways disease. Sequelae of acute
bronchiolitis and/or atypical infection would be the primary
differential consideration.

## 2020-03-02 ENCOUNTER — Telehealth: Payer: Self-pay | Admitting: Internal Medicine

## 2020-03-02 NOTE — Telephone Encounter (Signed)
Dupixent Order: 300mg  #2 prefilled syringe Ordered Date: 03/02/20 Expected date of arrival: 03/03/20 Ordered by: [place]: [place]

## 2020-03-04 IMAGING — DX DG CHEST 1V PORT
1 series · 1 of 1 positions shown · non-contrast
Comparison: Multiple prior exams most recent radiograph 02/14/2019.
Most recent CT 10/09/2017

CLINICAL DATA: Shortness of breath.

EXAM:
PORTABLE CHEST 1 VIEW

[chest]
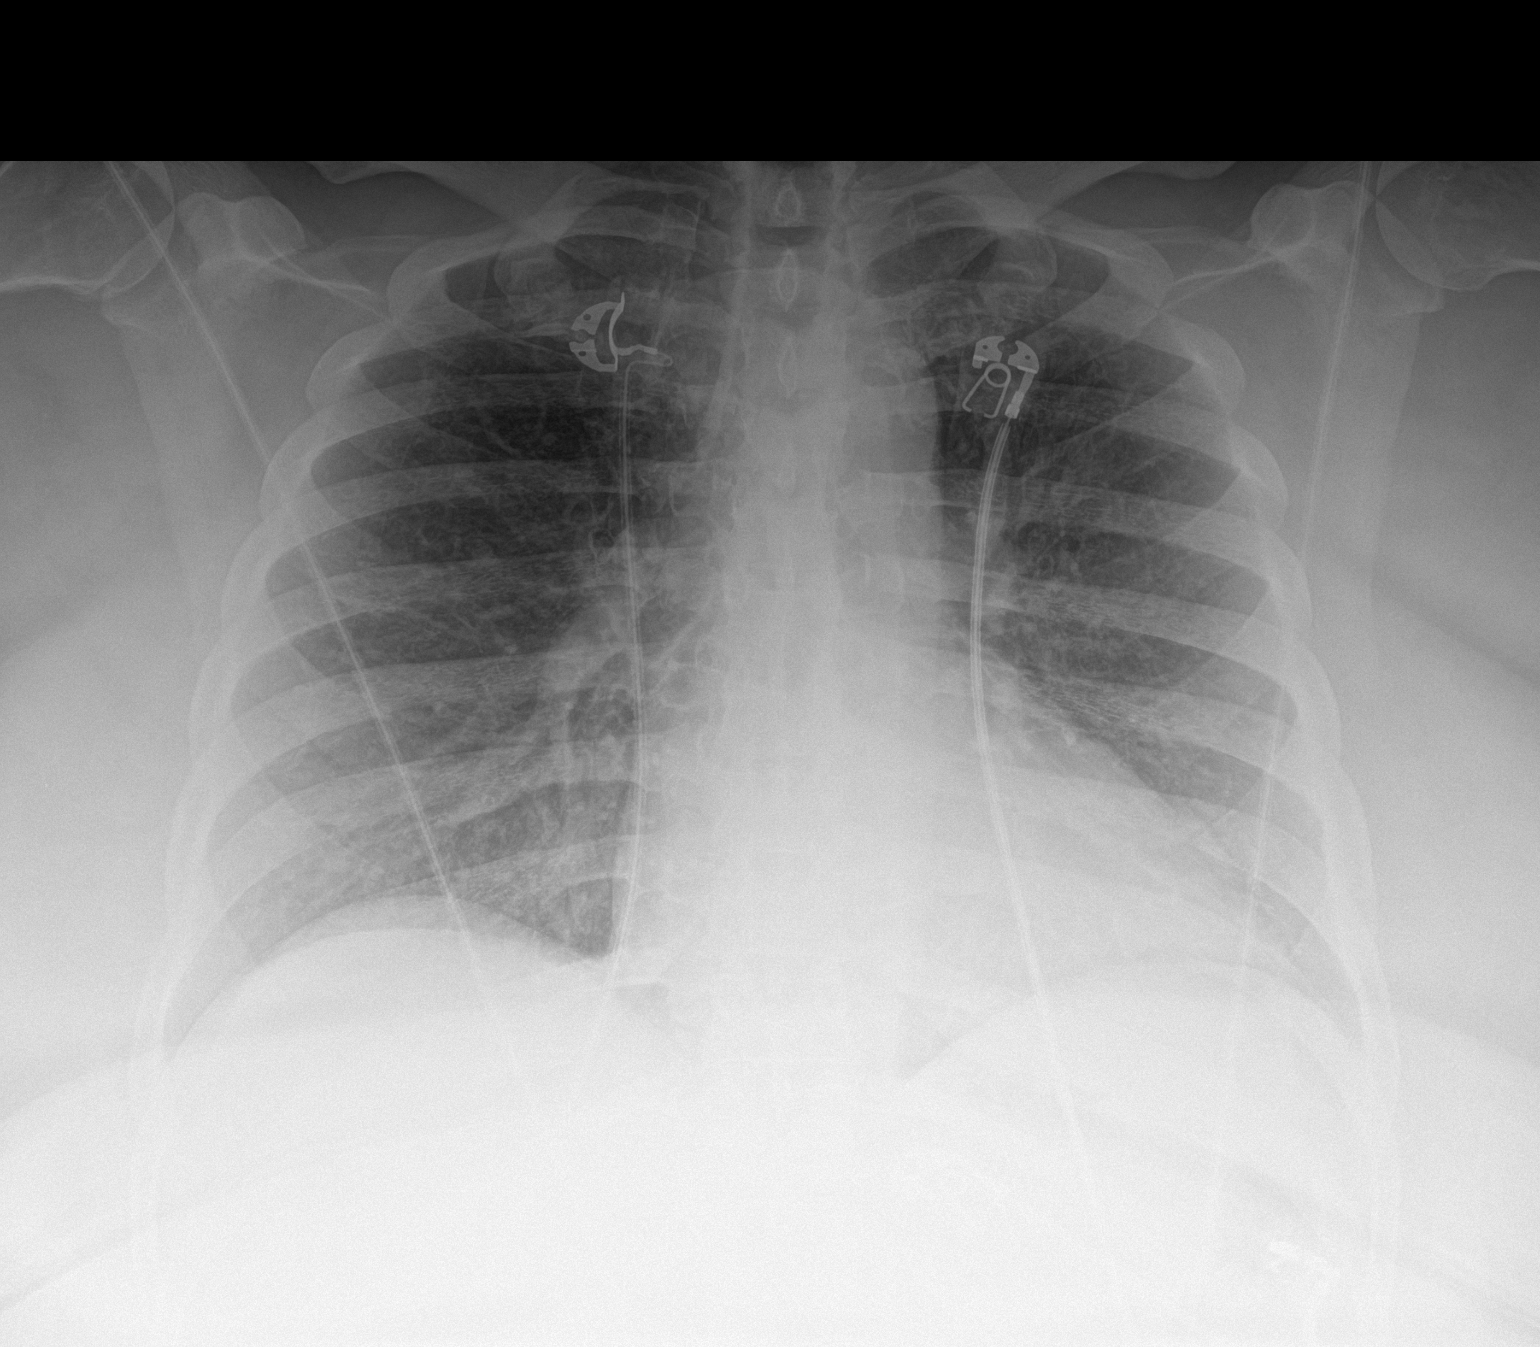

[1 of 1 positions shown; findings below may reference images not displayed]

FINDINGS: The cardiomediastinal contours are normal. Improved peribronchial
thickening from prior exam. Pulmonary vasculature is normal. No
consolidation, pleural effusion, or pneumothorax. No acute osseous
abnormalities are seen.
IMPRESSION: Improved peribronchial thickening from prior exam. No new
abnormality.

## 2020-03-06 NOTE — Telephone Encounter (Signed)
Dupixent Shipment Received: 300mg  #2 prefilled syringe Medication arrival date: 03/06/20 Lot #: [id number] Exp date: 06/26/2022 Received by: Elliot Dally

## 2020-03-07 IMAGING — DX DG CHEST 1V PORT
1 series · 1 of 1 positions shown · non-contrast
Comparison: Chest radiograph dated 02/17/2019

CLINICAL DATA: 48-year-old female with shortness of breath.

EXAM:
PORTABLE CHEST 1 VIEW

[chest]
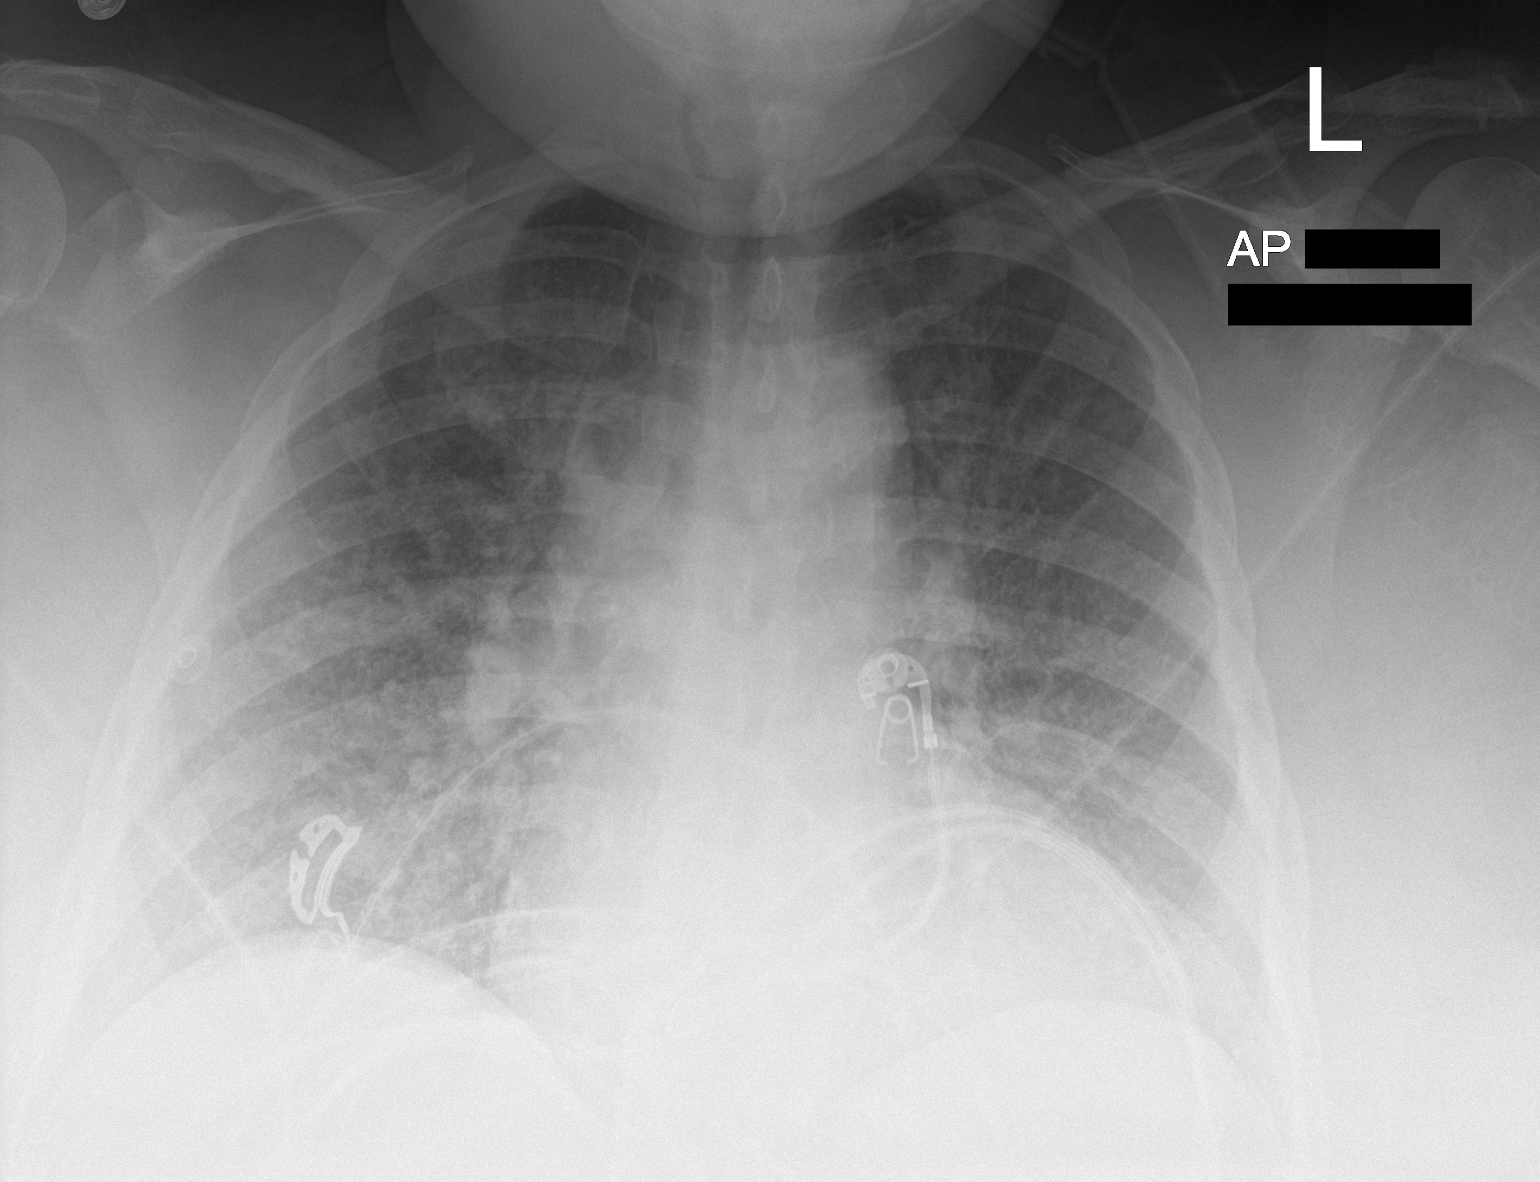

[1 of 1 positions shown; findings below may reference images not displayed]

FINDINGS: Mild cardiomegaly with vascular congestion, progressed since the
prior radiograph. Pneumonia is not excluded. Clinical correlation is
recommended. No focal consolidation, pleural effusion, pneumothorax.
No acute osseous pathology.
IMPRESSION: Mild cardiomegaly with vascular congestion. Pneumonia is not
excluded. No focal consolidation.

## 2020-03-13 ENCOUNTER — Ambulatory Visit: Payer: Medicare Other

## 2020-03-14 ENCOUNTER — Ambulatory Visit (HOSPITAL_COMMUNITY)
Admission: EM | Admit: 2020-03-14 | Discharge: 2020-03-14 | Disposition: A | Discharge status: Home / Self Care | Payer: Medicare Other | Attending: Urgent Care | Admitting: Urgent Care

## 2020-03-14 ENCOUNTER — Other Ambulatory Visit: Payer: Self-pay

## 2020-03-14 ENCOUNTER — Encounter (HOSPITAL_COMMUNITY): Payer: Self-pay | Admitting: Emergency Medicine

## 2020-03-14 ENCOUNTER — Telehealth: Payer: Self-pay | Admitting: Internal Medicine

## 2020-03-14 DIAGNOSIS — L089 Local infection of the skin and subcutaneous tissue, unspecified: Secondary | ICD-10-CM

## 2020-03-14 DIAGNOSIS — M79662 Pain in left lower leg: Secondary | ICD-10-CM | POA: Diagnosis not present

## 2020-03-14 DIAGNOSIS — B9689 Other specified bacterial agents as the cause of diseases classified elsewhere: Secondary | ICD-10-CM | POA: Diagnosis not present

## 2020-03-14 MED ORDER — CEPHALEXIN 500 MG PO CAPS: 500.0000 mg | ORAL_CAPSULE | Freq: Three times a day (TID) | ORAL | 0 refills | Status: DC

## 2020-03-14 NOTE — Discharge Instructions (Signed)
Please change your dressing 2-3 times daily. Do not apply any ointments or creams. Each time you change your dressing, make sure that you are pressing on the wound to get pus to come out.  Try your best to have a family member help you clean gently around the perimeter of the wound with gentle soap and warm water. Pat your wound dry and let it air out if possible to make sure it is dry before reapplying another dressing. Once your wound scabs over, there is no need to continue to apply dressings. This can take anywhere from 3-7 days.

## 2020-03-14 NOTE — ED Provider Notes (Signed)
[place]   MRN: [id number] DOB: Aug 28, 1970  Subjective:   April [name] is a 49 y.o. female presenting for 1 week history of 2 persistent small bumps that get red and swollen and decrease in size again.  Area is painful.  She has noted that one has started to scab over.  Last A1c was done 03/24/2019 and was 5.6%.  Denies fever, nausea, vomiting, calf pain and swelling.  No current facility-administered medications for this encounter.  Current Outpatient Medications:  .  albuterol (PROAIR HFA) 108 (90 Base) MCG/ACT inhaler, Inhale 2 puffs every 6 hourss if needed- rescue, Disp: 18 g, Rfl: 12 .  cloNIDine (CATAPRES) 0.3 MG tablet, Take 1 tablet (0.3 mg total) by mouth at bedtime. For hot flashes, Disp: 30 tablet, Rfl: 6 .  dupilumab (DUPIXENT) 300 MG/2ML prefilled syringe, Inject 300 mg into the skin every 14 (fourteen) days., Disp: 4 mL, Rfl: 11 .  fluticasone furoate-vilanterol (BREO ELLIPTA) 200-25 MCG/INH AEPB, Inhale 1 puff then rinse mouth,once daily, Disp: 60 each, Rfl: 12 .  hydrALAZINE (APRESOLINE) 10 MG tablet, Take 1 tablet (10 mg total) by mouth every 8 (eight) hours for 30 days., Disp: 90 tablet, Rfl: 1 .  metoprolol tartrate (LOPRESSOR) 25 MG tablet, Take 1 tablet (25 mg total) by mouth 2 (two) times daily. Dx: HTN, Disp: 60 tablet, Rfl: 6 .  montelukast (SINGULAIR) 10 MG tablet, Take 1 tablet (10 mg total) by mouth at bedtime., Disp: 30 tablet, Rfl: 6 .  omeprazole (PRILOSEC) 20 MG capsule, Take 1 capsule (20 mg total) by mouth 2 (two) times daily before a meal., Disp: 60 capsule, Rfl: 6 .  oxyCODONE-acetaminophen (PERCOCET) 10-325 MG tablet, Take 1 tablet by mouth 5 (five) times daily as needed for pain., Disp: , Rfl:  .  pregabalin (LYRICA) 75 MG capsule, Take 75 mg by mouth 2 (two) times daily., Disp: , Rfl:  .  VYVANSE 70 MG capsule, Take 70 mg by mouth daily., Disp: , Rfl:  .  apixaban (ELIQUIS) 5 MG TABS tablet, TAKE 1 TABLET(5 MG) BY MOUTH TWICE DAILY  (Patient not taking: Reported on 03/14/2020), Disp: 60 tablet, Rfl: 6 .  azithromycin (ZITHROMAX) 250 MG tablet, 2 pills today then 1 pill daily for 4 days (Patient not taking: Reported on 02/23/2020), Disp: 6 tablet, Rfl: 0 .  cephALEXin (KEFLEX) 500 MG capsule, Take 1 capsule (500 mg total) by mouth 3 (three) times daily., Disp: 21 capsule, Rfl: 0 .  cyclobenzaprine (FLEXERIL) 10 MG tablet, Take 10 mg by mouth 3 (three) times daily as needed for muscle spasms., Disp: , Rfl:  .  DULoxetine (CYMBALTA) 60 MG capsule, Take 1 capsule (60 mg total) by mouth daily. (Patient not taking: Reported on 03/14/2020), Disp: 30 capsule, Rfl: 6 .  EPINEPHrine 0.3 mg/0.3 mL IJ SOAJ injection, Inject 0.3 mLs (0.3 mg total) into the muscle as needed for anaphylaxis., Disp: 1 each, Rfl: 1 .  Ferrous Sulfate (IRON) 325 (65 Fe) MG TABS, Take 1 tablet (325 mg total) by mouth every morning. (Patient taking differently: Take 1 tablet by mouth daily. ), Disp: 30 each, Rfl: 0 .  fluticasone (FLONASE) 50 MCG/ACT nasal spray, Place 1 spray into both nostrils daily., Disp: 30 g, Rfl: 2 .  furosemide (LASIX) 40 MG tablet, Take 1 tablet (40 mg total) by mouth daily., Disp: 30 tablet, Rfl: 6 .  gabapentin (NEURONTIN) 600 MG tablet, Take 1,200 mg by mouth 3 (three) times daily. (Patient not taking:  Reported on 03/14/2020), Disp: , Rfl:  .  guaiFENesin-codeine 100-10 MG/5ML syrup, Take 5 mLs by mouth 3 (three) times daily as needed for cough., Disp: 120 mL, Rfl: 0 .  hydrocortisone-pramoxine (ANALPRAM HC) 2.5-1 % rectal cream, Place 1 application rectally 3 (three) times daily., Disp: 30 g, Rfl: 1 .  hydrOXYzine (ATARAX/VISTARIL) 25 MG tablet, TAKE 1 TABLET(25 MG) BY MOUTH THREE TIMES DAILY AS NEEDED, Disp: 90 tablet, Rfl: 1 .  ipratropium-albuterol (DUONEB) 0.5-2.5 (3) MG/3ML SOLN, Nebulize 1 ampule every 6 hours if needed, Disp: 360 mL, Rfl: 12 .  nicotine (NICODERM CQ - DOSED IN MG/24 HOURS) 21 mg/24hr patch, Place 1 patch (21 mg  total) onto the skin daily. (Patient not taking: Reported on 12/30/2019), Disp: 28 patch, Rfl: 1 .  OZEMPIC, 1 MG/DOSE, 2 MG/1.5ML SOPN, Inject 1 mg into the skin every Wednesday. , Disp: , Rfl:  .  phentermine 15 MG capsule, Take 15 mg by mouth daily., Disp: , Rfl:  .  potassium chloride SA (KLOR-CON) 20 MEQ tablet, Take 1 tablet (20 mEq total) by mouth daily., Disp: 30 tablet, Rfl: 3 .  predniSONE (DELTASONE) 20 MG tablet, 3 pills today then 2 pills daily x 2 days, 1 pill daily x 2 days then 1/2 pill daily x 4 days (Patient not taking: Reported on 02/23/2020), Disp: 11 tablet, Rfl: 0 .  Vitamin D, Ergocalciferol, (DRISDOL) 1.25 MG (50000 UT) CAPS capsule, TAKE 1 CAPSULE BY MOUTH 1 TIME A WEEK (Patient taking differently: Take 50,000 Units by mouth every Wednesday. ), Disp: 9 capsule, Rfl: 1 .  zolpidem (AMBIEN) 5 MG tablet, 1 at bedtime as needed for sleep, Disp: 30 tablet, Rfl: 5   Allergies  Allergen Reactions  . Contrast Media [Iodinated Diagnostic Agents] Itching    Ct contrast    Past Medical History:  Diagnosis Date  . Acanthosis nigricans   . Anxiety   . Arthritis    "knees" (04/28/2017)  . Asthma    Followed by Dr. Annamaria Boots (pulmonology); receives every other week omalizumab injections; has frequent exacerbations  . Chronic diastolic CHF (congestive heart failure) ([place]) 01/17/2017  . COPD (chronic obstructive pulmonary disease) ([place])    PFTs in 2002, FEV1/FVC 65, no post bronchodilater test done  . Depression   . GERD (gastroesophageal reflux disease)   . Headache(784.0)    "q couple days" (04/28/2017)  . Helicobacter pylori (H. pylori) infection   . Hypertension, essential   . Insomnia   . Menorrhagia   . Morbid obesity ([place])   . OSA on CPAP    Sleep study 2008 - mild OSA, not enough events to titrate CPAP; wears CPAP now/pt on 04/28/2017  . Pneumonia X 1  . Seasonal allergies   . Shortness of breath   . Tobacco user      Past Surgical History:  Procedure Laterality  Date  . CARDIOVERSION N/A 05/30/2017   Procedure: CARDIOVERSION;  Surgeon: Sanda Klein, MD;  Location: [facility];  Service: Cardiovascular;  Laterality: N/A;  . REDUCTION MAMMAPLASTY Bilateral 09/2011  . TUBAL LIGATION  1996   bilateral    Family History  Problem Relation Age of Onset  . Hypertension Mother   . Asthma Daughter   . Cancer Paternal Aunt   . Asthma Maternal Grandmother     Social History   Tobacco Use  . Smoking status: Former Smoker    Packs/day: 0.50    Years: 26.00    Pack years: 13.00    Types: Cigarettes  Quit date: 09/12/2014    Years since quitting: 5.5  . Smokeless tobacco: Never Used  Vaping Use  . Vaping Use: Never used  Substance Use Topics  . Alcohol use: No  . Drug use: No    ROS   Objective:   Vitals: BP 121/75 (BP Location: Right Arm) Comment (BP Location): large cuff  Pulse 80   Temp 98 F (36.7 C) (Oral)   Resp (!) 22   SpO2 95%   Physical Exam Constitutional:      General: She is not in acute distress.    Appearance: Normal appearance. She is well-developed. She is not ill-appearing.  HENT:     Head: Normocephalic and atraumatic.     Nose: Nose normal.     Mouth/Throat:     Mouth: Mucous membranes are moist.     Pharynx: Oropharynx is clear.  Eyes:     General: No scleral icterus.       Right eye: No discharge.        Left eye: No discharge.     Extraocular Movements: Extraocular movements intact.     Conjunctiva/sclera: Conjunctivae normal.     Pupils: Pupils are equal, round, and reactive to light.  Cardiovascular:     Rate and Rhythm: Normal rate.  Pulmonary:     Effort: Pulmonary effort is normal.  Skin:    General: Skin is warm and dry.       Neurological:     General: No focal deficit present.     Mental Status: She is alert and oriented to person, place, and time.  Psychiatric:        Mood and Affect: Mood normal.        Behavior: Behavior normal.        Thought Content: Thought content normal.         Judgment: Judgment normal.    Area cleansed with iodine.  Lateralmost lesion was lanced with 1 cc of purulence drained.  Area cleansed, pressure dressing applied.  Assessment and Plan :   PDMP not reviewed this encounter.  1. Superficial bacterial skin infection   2. Pain in left lower leg     Start Keflex, Tylenol and naproxen for pain control. Counseled patient on potential for adverse effects with medications prescribed/recommended today, ER and return-to-clinic precautions discussed, patient verbalized understanding.    Jaynee Eagles, [place] 03/14/20 1904

## 2020-03-14 NOTE — ED Triage Notes (Signed)
Patient has a soft knot to left lower leg noticed one week ago.  2 small bumps on a larger slightly red, swollen area to left lower leg.  One small bump has scabbing.

## 2020-03-14 NOTE — Telephone Encounter (Signed)
Called and spoke with Patient. Patient scheduled 03/17/20 at 1530 for Dupixent injection.

## 2020-03-15 ENCOUNTER — Telehealth: Payer: Self-pay | Admitting: Internal Medicine

## 2020-03-15 MED ORDER — PREDNISONE 10 MG PO TABS: ORAL_TABLET | ORAL | 0 refills | Status: DC

## 2020-03-15 NOTE — Telephone Encounter (Signed)
Please offer prednisone 10 mg, # 20, 4 X 2 DAYS, 3 X 2 DAYS, 2 X 2 DAYS, 1 X 2 DAYS Thanks

## 2020-03-15 NOTE — Telephone Encounter (Signed)
Spoke with pt. She is aware of CY's recommendation. Rx has been sent in. Nothing further was needed.  

## 2020-03-15 NOTE — Telephone Encounter (Signed)
Spoke with pt. Feels like she is having an asthma flare up. Reports increased wheezing and coughing. Cough is non productive. Denies chest tightness, shortness of breath, fever or sick contacts. Pt would like to have a prednisone taper sent in.  CY - please advise. Thanks.

## 2020-03-17 ENCOUNTER — Ambulatory Visit (INDEPENDENT_AMBULATORY_CARE_PROVIDER_SITE_OTHER): Payer: Medicare Other

## 2020-03-17 ENCOUNTER — Other Ambulatory Visit: Payer: Self-pay

## 2020-03-17 DIAGNOSIS — J4541 Moderate persistent asthma with (acute) exacerbation: Secondary | ICD-10-CM

## 2020-03-17 MED ORDER — DUPILUMAB 300 MG/2ML ~~LOC~~ SOSY
300.0000 mg | PREFILLED_SYRINGE | Freq: Once | SUBCUTANEOUS | Status: AC
  Administered 2020-03-17: 300 mg via SUBCUTANEOUS

## 2020-03-17 NOTE — Progress Notes (Signed)
Have you been hospitalized within the last 10 days?  No Do you have a fever?  No Do you have a cough?  No Do you have a headache or sore throat? No Do you have your Epi Pen visible and is it within date?  Yes 

## 2020-03-18 ENCOUNTER — Other Ambulatory Visit: Payer: Self-pay

## 2020-03-18 ENCOUNTER — Emergency Department (HOSPITAL_COMMUNITY): Payer: Medicare Other

## 2020-03-18 ENCOUNTER — Encounter (HOSPITAL_COMMUNITY): Payer: Self-pay | Admitting: Emergency Medicine

## 2020-03-18 ENCOUNTER — Emergency Department (HOSPITAL_COMMUNITY)
Admission: EM | Admit: 2020-03-18 | Discharge: 2020-03-18 | Disposition: A | Discharge status: Home / Self Care | Payer: Medicare Other | Attending: Emergency Medicine | Admitting: Emergency Medicine

## 2020-03-18 DIAGNOSIS — I110 Hypertensive heart disease with heart failure: Secondary | ICD-10-CM | POA: Diagnosis not present

## 2020-03-18 DIAGNOSIS — R059 Cough, unspecified: Secondary | ICD-10-CM | POA: Diagnosis not present

## 2020-03-18 DIAGNOSIS — Z79899 Other long term (current) drug therapy: Secondary | ICD-10-CM | POA: Insufficient documentation

## 2020-03-18 DIAGNOSIS — J4540 Moderate persistent asthma, uncomplicated: Secondary | ICD-10-CM

## 2020-03-18 DIAGNOSIS — I5032 Chronic diastolic (congestive) heart failure: Secondary | ICD-10-CM | POA: Insufficient documentation

## 2020-03-18 DIAGNOSIS — Z87891 Personal history of nicotine dependence: Secondary | ICD-10-CM | POA: Diagnosis not present

## 2020-03-18 DIAGNOSIS — R000 Tachycardia, unspecified: Secondary | ICD-10-CM | POA: Diagnosis not present

## 2020-03-18 DIAGNOSIS — Z20822 Contact with and (suspected) exposure to covid-19: Secondary | ICD-10-CM | POA: Insufficient documentation

## 2020-03-18 LAB — COMPREHENSIVE METABOLIC PANEL
ALT: 11 U/L (ref 0–44)
AST: 21 U/L (ref 15–41)
Albumin: 3.4 g/dL — ABNORMAL LOW (ref 3.5–5.0)
Alkaline Phosphatase: 72 U/L (ref 38–126)
Anion gap: 8 (ref 5–15)
BUN: 12 mg/dL (ref 6–20)
CO2: 22 mmol/L (ref 22–32)
Calcium: 8 mg/dL — ABNORMAL LOW (ref 8.9–10.3)
Chloride: 107 mmol/L (ref 98–111)
Creatinine, Ser: 0.78 mg/dL (ref 0.44–1.00)
GFR, Estimated: >60 mL/min (ref >60)
Glucose, Bld: 106 mg/dL — ABNORMAL HIGH (ref 70–99)
Potassium: 4.2 mmol/L (ref 3.5–5.1)
Sodium: 137 mmol/L (ref 135–145)
Total Bilirubin: 0.3 mg/dL (ref 0.3–1.2)
Total Protein: 7.1 g/dL (ref 6.5–8.1)

## 2020-03-18 LAB — URINALYSIS, ROUTINE W REFLEX MICROSCOPIC
Bilirubin Urine: NEGATIVE
Glucose, UA: NEGATIVE mg/dL
Hgb urine dipstick: NEGATIVE
Ketones, ur: 5 mg/dL — AB
Leukocytes,Ua: NEGATIVE
Nitrite: NEGATIVE
Protein, ur: 30 mg/dL — AB
Specific Gravity, Urine: 1.032 — ABNORMAL HIGH (ref 1.005–1.030)
pH: 5 (ref 5.0–8.0)

## 2020-03-18 LAB — CBC WITH DIFFERENTIAL/PLATELET
Abs Immature Granulocytes: 0.03 10*3/uL (ref 0.00–0.07)
Basophils Absolute: 0 10*3/uL (ref 0.0–0.1)
Basophils Relative: 0 %
Eosinophils Absolute: 0 10*3/uL (ref 0.0–0.5)
Eosinophils Relative: 0 %
HCT: 39.4 % (ref 36.0–46.0)
Hemoglobin: 12.5 g/dL (ref 12.0–15.0)
Immature Granulocytes: 0 %
Lymphocytes Relative: 27 %
Lymphs Abs: 2.1 10*3/uL (ref 0.7–4.0)
MCH: 26.5 pg (ref 26.0–34.0)
MCHC: 31.7 g/dL (ref 30.0–36.0)
MCV: 83.5 fL (ref 80.0–100.0)
Monocytes Absolute: 1.1 10*3/uL — ABNORMAL HIGH (ref 0.1–1.0)
Monocytes Relative: 13 %
Neutro Abs: 4.8 10*3/uL (ref 1.7–7.7)
Neutrophils Relative %: 60 %
Platelets: 485 10*3/uL — ABNORMAL HIGH (ref 150–400)
RBC: 4.72 MIL/uL (ref 3.87–5.11)
RDW: 17.1 % — ABNORMAL HIGH (ref 11.5–15.5)
WBC: 8 10*3/uL (ref 4.0–10.5)
nRBC: 0 % (ref 0.0–0.2)

## 2020-03-18 LAB — RESP PANEL BY RT-PCR (FLU A&B, COVID) ARPGX2
Influenza A by PCR: NEGATIVE
Influenza B by PCR: NEGATIVE
SARS Coronavirus 2 by RT PCR: NEGATIVE

## 2020-03-18 MED ORDER — IPRATROPIUM-ALBUTEROL 0.5-2.5 (3) MG/3ML IN SOLN
3.0000 mL | Freq: Once | RESPIRATORY_TRACT | Status: AC
  Administered 2020-03-18: 3 mL via RESPIRATORY_TRACT
  Filled 2020-03-18: qty 3

## 2020-03-18 MED ORDER — ALBUTEROL SULFATE HFA 108 (90 BASE) MCG/ACT IN AERS
4.0000 | INHALATION_SPRAY | Freq: Once | RESPIRATORY_TRACT | Status: AC
  Administered 2020-03-18: 4 via RESPIRATORY_TRACT
  Filled 2020-03-18: qty 6.7

## 2020-03-18 MED ORDER — GUAIFENESIN 100 MG/5ML PO SOLN
10.0000 mL | Freq: Once | ORAL | Status: AC
  Administered 2020-03-18: 200 mg via ORAL
  Filled 2020-03-18: qty 10

## 2020-03-18 MED ORDER — DEXAMETHASONE SODIUM PHOSPHATE 10 MG/ML IJ SOLN
10.0000 mg | Freq: Once | INTRAMUSCULAR | Status: AC
  Administered 2020-03-18: 10 mg via INTRAVENOUS
  Filled 2020-03-18: qty 1

## 2020-03-18 MED ORDER — ALBUTEROL SULFATE HFA 108 (90 BASE) MCG/ACT IN AERS
2.0000 | INHALATION_SPRAY | Freq: Once | RESPIRATORY_TRACT | Status: AC
  Administered 2020-03-18: 2 via RESPIRATORY_TRACT
  Filled 2020-03-18: qty 6.7

## 2020-03-18 MED ORDER — IPRATROPIUM BROMIDE HFA 17 MCG/ACT IN AERS: 2.0000 | INHALATION_SPRAY | Freq: Once | RESPIRATORY_TRACT | Status: DC

## 2020-03-18 NOTE — ED Triage Notes (Signed)
Pt states that she has asthma and has been taking her inhalers and nebs at home without full relief. On prednisone taper pack at home. Alert and oriented. 95% O2

## 2020-03-18 NOTE — ED Provider Notes (Signed)
[facility] DEPT Provider Note   CSN: [id number] Arrival date & time: 03/18/20  1630     History Chief Complaint  Patient presents with  . Asthma    April [name] is a 49 y.o. female.  HPI Patient is a 49 year old female with a history of COPD, morbid obesity, pneumonia, shortness of breath, asthma, who presents to the emergency department due to worsening cough and wheezing.  Her symptoms began to worsen a few days ago.  States it feels similar to prior asthma exacerbations.  She has been using her inhalers as well as nebulizers at home without full relief.  She is currently on a prednisone taper pack and denies any relief.  Followed by Dr. Annamaria Boots with pulmonology.  She has been vaccinated for COVID-19.  No fevers, chills, sore throat, rhinorrhea, chest pain, abdominal pain, nausea, vomiting, urinary changes.    Past Medical History:  Diagnosis Date  . Acanthosis nigricans   . Anxiety   . Arthritis    "knees" (04/28/2017)  . Asthma    Followed by Dr. Annamaria Boots (pulmonology); receives every other week omalizumab injections; has frequent exacerbations  . Chronic diastolic CHF (congestive heart failure) ([place]) 01/17/2017  . COPD (chronic obstructive pulmonary disease) ([place])    PFTs in 2002, FEV1/FVC 65, no post bronchodilater test done  . Depression   . GERD (gastroesophageal reflux disease)   . Headache(784.0)    "q couple days" (04/28/2017)  . Helicobacter pylori (H. pylori) infection   . Hypertension, essential   . Insomnia   . Menorrhagia   . Morbid obesity ([place])   . OSA on CPAP    Sleep study 2008 - mild OSA, not enough events to titrate CPAP; wears CPAP now/pt on 04/28/2017  . Pneumonia X 1  . Seasonal allergies   . Shortness of breath   . Tobacco user     Patient Active Problem List   Diagnosis Date Noted  . Acute respiratory failure ([place]) 02/20/2019  . CHF exacerbation ([place]) 02/20/2019  . Hot flashes   . COPD exacerbation ([place])  10/26/2018  . Acute respiratory distress 08/03/2018  . Acute exacerbation of chronic obstructive pulmonary disease (COPD) ([place]) 08/03/2018  . Vitamin D deficiency 05/26/2018  . Acute asthma exacerbation 05/16/2018  . Urinary incontinence 11/17/2017  . Nausea with vomiting 11/06/2017  . Hypomagnesemia 10/27/2017  . Dysphagia 10/27/2017  . Physical deconditioning 10/25/2017  . Acute maxillary sinusitis 10/24/2017  . Emesis, persistent 10/08/2017  . Volume depletion 10/08/2017  . Pleuritic chest pain 10/08/2017  . SOB (shortness of breath) 10/08/2017  . Debility 09/22/2017  . HCAP (healthcare-associated pneumonia) 08/06/2017  . Chronic atrial fibrillation 07/29/2017  . Acute respiratory failure with hypoxia ([place]) 07/07/2017  . AF (paroxysmal atrial fibrillation) ([place])   . Dyspnea 02/04/2017  . Asthma exacerbation 01/17/2017  . Metabolic syndrome 42/70/6237  . Moderate persistent asthma with exacerbation 10/19/2016  . Normocytic anemia 10/02/2016  . Elevated hemoglobin A1c 05/05/2016  . GERD (gastroesophageal reflux disease) 08/30/2015  . Anxiety and depression 08/30/2015  . Generalized anxiety disorder 08/30/2015  . Seasonal allergic rhinitis 08/29/2013  . Leukocytosis 10/21/2012  . Tobacco abuse in remission 10/07/2012  . COPD mixed type ([place]) 05/07/2012  . Knee pain, bilateral 04/25/2011  . Insomnia 03/14/2011  . Obstructive sleep apnea 12/19/2010  . Hypokalemia 08/13/2010  . Cervical back pain with evidence of disc disease 04/08/2008  . HTN (hypertension) 07/31/2006  . Morbid obesity due to excess calories ([place]) 06/17/2006  . Major  depressive disorder, recurrent episode ([place]) 04/10/2006    Past Surgical History:  Procedure Laterality Date  . CARDIOVERSION N/A 05/30/2017   Procedure: CARDIOVERSION;  Surgeon: Sanda Klein, MD;  Location: [facility];  Service: Cardiovascular;  Laterality: N/A;  . REDUCTION MAMMAPLASTY Bilateral 09/2011  . TUBAL LIGATION  1996    bilateral     OB History   No obstetric history on file.     Family History  Problem Relation Age of Onset  . Hypertension Mother   . Asthma Daughter   . Cancer Paternal Aunt   . Asthma Maternal Grandmother     Social History   Tobacco Use  . Smoking status: Former Smoker    Packs/day: 0.50    Years: 26.00    Pack years: 13.00    Types: Cigarettes    Quit date: 09/12/2014    Years since quitting: 5.5  . Smokeless tobacco: Never Used  Vaping Use  . Vaping Use: Never used  Substance Use Topics  . Alcohol use: No  . Drug use: No    Home Medications Prior to Admission medications   Medication Sig Start Date End Date Taking? Authorizing Provider  albuterol (PROAIR HFA) 108 (90 Base) MCG/ACT inhaler Inhale 2 puffs every 6 hourss if needed- rescue 08/12/19   Baird Lyons D, MD  apixaban (ELIQUIS) 5 MG TABS tablet TAKE 1 TABLET(5 MG) BY MOUTH TWICE DAILY Patient not taking: Reported on 03/14/2020 02/23/20   [name], MD  azithromycin (ZITHROMAX) 250 MG tablet 2 pills today then 1 pill daily for 4 days Patient not taking: Reported on 02/23/2020 12/30/19   Fulp, Cammie, MD  cephALEXin (KEFLEX) 500 MG capsule Take 1 capsule (500 mg total) by mouth 3 (three) times daily. 03/14/20   Jaynee Eagles, PA-C  cloNIDine (CATAPRES) 0.3 MG tablet Take 1 tablet (0.3 mg total) by mouth at bedtime. For hot flashes 02/23/20   [name], MD  cyclobenzaprine (FLEXERIL) 10 MG tablet Take 10 mg by mouth 3 (three) times daily as needed for muscle spasms. 01/08/19   [provider]  DULoxetine (CYMBALTA) 60 MG capsule Take 1 capsule (60 mg total) by mouth daily. Patient not taking: Reported on 03/14/2020 02/23/20   [name], MD  dupilumab (DUPIXENT) 300 MG/2ML prefilled syringe Inject 300 mg into the skin every 14 (fourteen) days. 12/06/19   Baird Lyons D, MD  EPINEPHrine 0.3 mg/0.3 mL IJ SOAJ injection Inject 0.3 mLs (0.3 mg total) into the muscle as needed for anaphylaxis.  11/19/18   Baird Lyons D, MD  Ferrous Sulfate (IRON) 325 (65 Fe) MG TABS Take 1 tablet (325 mg total) by mouth every morning. Patient taking differently: Take 1 tablet by mouth daily.  01/26/18   Argentina Donovan, PA-C  fluticasone (FLONASE) 50 MCG/ACT nasal spray Place 1 spray into both nostrils daily. 08/12/18   [name], MD  fluticasone furoate-vilanterol (BREO ELLIPTA) 200-25 MCG/INH AEPB Inhale 1 puff then rinse mouth,once daily 12/13/19   Baird Lyons D, MD  furosemide (LASIX) 40 MG tablet Take 1 tablet (40 mg total) by mouth daily. 02/23/20   [name], MD  gabapentin (NEURONTIN) 600 MG tablet Take 1,200 mg by mouth 3 (three) times daily. Patient not taking: Reported on 03/14/2020 01/08/19   [provider]  guaiFENesin-codeine 100-10 MG/5ML syrup Take 5 mLs by mouth 3 (three) times daily as needed for cough. 12/30/19   Fulp, Cammie, MD  hydrALAZINE (APRESOLINE) 10 MG tablet Take 1 tablet (10 mg total) by mouth  every 8 (eight) hours for 30 days. 08/04/18 10/26/27  [name], NP  hydrocortisone-pramoxine (ANALPRAM HC) 2.5-1 % rectal cream Place 1 application rectally 3 (three) times daily. 03/24/19   [name], MD  hydrOXYzine (ATARAX/VISTARIL) 25 MG tablet TAKE 1 TABLET(25 MG) BY MOUTH THREE TIMES DAILY AS NEEDED 02/23/20   [name], MD  ipratropium-albuterol (DUONEB) 0.5-2.5 (3) MG/3ML SOLN Nebulize 1 ampule every 6 hours if needed 08/12/19   Baird Lyons D, MD  metoprolol tartrate (LOPRESSOR) 25 MG tablet Take 1 tablet (25 mg total) by mouth 2 (two) times daily. Dx: HTN 02/23/20   [name], MD  montelukast (SINGULAIR) 10 MG tablet Take 1 tablet (10 mg total) by mouth at bedtime. 02/23/20   [name], MD  nicotine (NICODERM CQ - DOSED IN MG/24 HOURS) 21 mg/24hr patch Place 1 patch (21 mg total) onto the skin daily. Patient not taking: Reported on 12/30/2019 02/14/19   Barton Dubois, MD  omeprazole (PRILOSEC) 20 MG capsule Take 1 capsule (20 mg  total) by mouth 2 (two) times daily before a meal. 02/23/20   [name], MD  oxyCODONE-acetaminophen (PERCOCET) 10-325 MG tablet Take 1 tablet by mouth 5 (five) times daily as needed for pain. 02/09/19   [provider]  OZEMPIC, 1 MG/DOSE, 2 MG/1.5ML SOPN Inject 1 mg into the skin every Wednesday.  01/21/19   [provider]  phentermine 15 MG capsule Take 15 mg by mouth daily. 01/20/19   [provider]  potassium chloride SA (KLOR-CON) 20 MEQ tablet Take 1 tablet (20 mEq total) by mouth daily. 01/26/19   [name], MD  predniSONE (DELTASONE) 10 MG tablet 4 X 2 DAYS, 3 X 2 DAYS, 2 X 2 DAYS, 1 X 2 DAYS 03/15/20   Deneise Lever, MD  predniSONE (DELTASONE) 20 MG tablet 3 pills today then 2 pills daily x 2 days, 1 pill daily x 2 days then 1/2 pill daily x 4 days Patient not taking: Reported on 02/23/2020 12/30/19   Fulp, Ander Gaster, MD  pregabalin (LYRICA) 75 MG capsule Take 75 mg by mouth 2 (two) times daily. 02/09/19   [provider]  Vitamin D, Ergocalciferol, (DRISDOL) 1.25 MG (50000 UT) CAPS capsule TAKE 1 CAPSULE BY MOUTH 1 TIME A WEEK Patient taking differently: Take 50,000 Units by mouth every Wednesday.  11/23/18   [name], MD  VYVANSE 70 MG capsule Take 70 mg by mouth daily. 01/20/19   [provider]  zolpidem (AMBIEN) 5 MG tablet 1 at bedtime as needed for sleep 12/13/19   Baird Lyons D, MD  mometasone-formoterol [facility]) 200-5 MCG/ACT AERO Inhale 2 puffs into the lungs 2 (two) times daily. Dx: Asthma 04/08/19 08/12/19  Deneise Lever, MD    Allergies    Contrast media [iodinated diagnostic agents]  Review of Systems   Review of Systems Ten systems reviewed and are negative for acute change, except as noted in the HPI.   Physical Exam Updated Vital Signs BP (!) 129/103   Pulse (!) 104   Temp 98.1 F (36.7 C) (Oral)   Resp 16   Ht 5\' 6"  (1.676 m)   Wt (!) 139.3 kg   SpO2 96%   BMI 49.55 kg/m   Physical  Exam Vitals and nursing note reviewed.  Constitutional:      General: She is not in acute distress.    Appearance: Normal appearance. She is obese. She is not ill-appearing, toxic-appearing or diaphoretic.  HENT:  Head: Normocephalic and atraumatic.     Right Ear: External ear normal.     Left Ear: External ear normal.     Nose: Nose normal.     Mouth/Throat:     Mouth: Mucous membranes are moist.     Pharynx: Oropharynx is clear. No oropharyngeal exudate or posterior oropharyngeal erythema.     Comments: Uvula midline.  Readily handling secretions.  Speaking in clear and coherent sentences. Eyes:     General: No scleral icterus.       Right eye: No discharge.        Left eye: No discharge.     Extraocular Movements: Extraocular movements intact.     Conjunctiva/sclera: Conjunctivae normal.  Cardiovascular:     Rate and Rhythm: Regular rhythm. Tachycardia present.     Pulses: Normal pulses.     Heart sounds: Normal heart sounds. No murmur heard.  No friction rub. No gallop.      Comments: Tachycardic.  No murmurs, rubs, gallops. Pulmonary:     Effort: Pulmonary effort is normal. No respiratory distress.     Breath sounds: No stridor. Wheezing present. No rhonchi or rales.     Comments: Diffuse expiratory wheezing noted bilaterally in the posterior lung fields. Chest:     Chest wall: No tenderness.  Abdominal:     General: Abdomen is flat.     Tenderness: There is no abdominal tenderness.  Musculoskeletal:        General: Normal range of motion.     Cervical back: Normal range of motion and neck supple. No tenderness.  Skin:    General: Skin is warm and dry.  Neurological:     General: No focal deficit present.     Mental Status: She is alert and oriented to person, place, and time.  Psychiatric:        Mood and Affect: Mood normal.        Behavior: Behavior normal.    ED Results / Procedures / Treatments   Labs (all labs ordered are listed, but only abnormal  results are displayed) Labs Reviewed  COMPREHENSIVE METABOLIC PANEL - Abnormal; Notable for the following components:      Result Value   Glucose, Bld 106 (*)    Calcium 8.0 (*)    Albumin 3.4 (*)    All other components within normal limits  CBC WITH DIFFERENTIAL/PLATELET - Abnormal; Notable for the following components:   RDW 17.1 (*)    Platelets 485 (*)    Monocytes Absolute 1.1 (*)    All other components within normal limits  URINALYSIS, ROUTINE W REFLEX MICROSCOPIC - Abnormal; Notable for the following components:   APPearance HAZY (*)    Specific Gravity, Urine 1.032 (*)    Ketones, ur 5 (*)    Protein, ur 30 (*)    Bacteria, UA RARE (*)    All other components within normal limits  RESP PANEL BY RT-PCR (FLU A&B, COVID) [id number]    EKG None  Radiology DG Chest Portable 1 View  Result Date: 03/18/2020 CLINICAL DATA:  Cough EXAM: PORTABLE CHEST 1 VIEW COMPARISON:  October 2020 FINDINGS: The heart size and mediastinal contours are within normal limits. Both lungs are clear. No pleural effusion or pneumothorax. The visualized skeletal structures are unremarkable. IMPRESSION: No acute process in the chest. Electronically Signed   By: Macy Mis M.D.   On: 03/18/2020 20:22    Procedures Procedures (including critical care time)  Medications Ordered in ED Medications  albuterol (VENTOLIN HFA) 108 (90 Base) MCG/ACT inhaler 2 puff (2 puffs Inhalation Given 03/18/20 1657)  guaiFENesin (ROBITUSSIN) 100 MG/5ML solution 200 mg (200 mg Oral Given 03/18/20 2045)  dexamethasone (DECADRON) injection 10 mg (10 mg Intravenous Given 03/18/20 2048)  albuterol (VENTOLIN HFA) 108 (90 Base) MCG/ACT inhaler 4 puff (4 puffs Inhalation Given 03/18/20 2047)  ipratropium-albuterol (DUONEB) 0.5-2.5 (3) MG/3ML nebulizer solution 3 mL (3 mLs Nebulization Given 03/18/20 2251)  ipratropium-albuterol (DUONEB) 0.5-2.5 (3) MG/3ML nebulizer solution 3 mL (3 mLs Nebulization Given 03/18/20 2251)   ED  Course  I have reviewed the triage vital signs and the nursing notes.  Pertinent labs & imaging results that were available during my care of the patient were reviewed by me and considered in my medical decision making (see chart for details).  Clinical Course as of Mar 18 2322  Sat Mar 18, 2020  2138 Patient reassessed and appears significantly improved.  Tachycardia has resolved.  Oxygen saturations around 100% on room air.  Patient still has mild expiratory wheezing but it has improved significantly.  Basic labs obtained and are generally reassuring.  CBC without leukocytosis.  Electrolytes within normal limits on CMP.  Chest x-ray benign.  COVID-19 test in process.   [LJ]  2239 SARS Coronavirus 2 by RT PCR: NEGATIVE [LJ]  2239 Influenza A By PCR: NEGATIVE [LJ]  2239 Influenza B By PCR: NEGATIVE [LJ]    Clinical Course User Index [LJ] [name], PA-C   MDM Rules/Calculators/A&P                          Patient is a 49 year old female that presents to the emergency department with coughing and wheezing for the past few days.  She has a history of COPD as well as asthma.  She is followed by pulmonology.  She has been vaccinated for COVID-19.  Due to her cough I obtained basic labs as well as a respiratory panel and chest x-ray.  These were reassuring.  Respiratory panel is negative for COVID-19, flu A, and flu B.  No leukocytosis on CBC.  CMP with electrolytes WNL.  Chest x-ray negative.  Patient given multiple duo nebs, IV Decadron, Robitussin for cough.  Her vital signs have improved significantly.  Patient states that she is starting to feel much better.  Her oxygen saturations are in the high 90s on room air while lying in the semi-Fowlers position as well as when standing and conversing with me.   Patient has albuterol as well as her duo nebs at home.  Feel that she is safe for discharge at this time and she is agreeable.  Recommended follow-up with her pulmonologist.  Return to  the ED with new or worsening symptoms.  Her questions were answered and she was amicable at the time of discharge.  Her vital signs are stable.  She is afebrile, not tachycardic, not hypoxic.  Final Clinical Impression(s) / ED Diagnoses Final diagnoses:  Moderate persistent asthma, unspecified whether complicated   Rx / DC Orders ED Discharge Orders    None       [name], PA-C 03/18/20 2326    Gareth Morgan, MD 03/20/20 1635

## 2020-03-18 NOTE — Discharge Instructions (Addendum)
You can continue to take an over-the-counter cough medicine such as Robitussin or Delsym.  I would recommend he continue taking your prednisone at home and finish your current course.  If your symptoms worsen you can always return to the emergency department for reevaluation.  Please follow-up with your pulmonologist regarding your symptoms.  It was a pleasure to meet you.

## 2020-03-21 ENCOUNTER — Telehealth: Payer: Self-pay | Admitting: Internal Medicine

## 2020-03-21 MED ORDER — PREDNISONE 10 MG PO TABS: ORAL_TABLET | ORAL | 0 refills | Status: DC

## 2020-03-21 NOTE — Telephone Encounter (Signed)
ATC patient, left detailed message for patient with instructions from [place].  Advised to call the office with any questions.

## 2020-03-21 NOTE — Telephone Encounter (Signed)
Hey Tammy, This is the one that I messaged you about.  Thank you.

## 2020-03-21 NOTE — Telephone Encounter (Signed)
Primary Pulmonologist: Dr. Annamaria Boots Last office visit and with whom: 12/13/19 with [facility]  What do we see them for (pulmonary problems): COPD mixed Last OV assessment/plan: patient started on Breo 200.   Was appointment offered to patient (explain)?  Patient just went to hosiptal on Sunday tested negative for Covid had a chest xray and was given steroids via IV.   Reason for call: Patient given prednisone taper on 03/15/20 she has completed. Patient is still having wheezing and coughing. Patient is doing Breo 200 inhaler and duoneb nebulizer solution 2-3 times a day.   Patient requesting another prednisone taper or anything else that will help.   Dr. Annamaria Boots please advise  Allergies  Allergen Reactions  . Contrast Media [Iodinated Diagnostic Agents] Itching    Ct contrast   Current Outpatient Medications on File Prior to Visit  Medication Sig Dispense Refill  . albuterol (PROAIR HFA) 108 (90 Base) MCG/ACT inhaler Inhale 2 puffs every 6 hourss if needed- rescue 18 g 12  . apixaban (ELIQUIS) 5 MG TABS tablet TAKE 1 TABLET(5 MG) BY MOUTH TWICE DAILY (Patient not taking: Reported on 03/14/2020) 60 tablet 6  . cephALEXin (KEFLEX) 500 MG capsule Take 1 capsule (500 mg total) by mouth 3 (three) times daily. 21 capsule 0  . cloNIDine (CATAPRES) 0.3 MG tablet Take 1 tablet (0.3 mg total) by mouth at bedtime. For hot flashes 30 tablet 6  . cyclobenzaprine (FLEXERIL) 10 MG tablet Take 10 mg by mouth 3 (three) times daily as needed for muscle spasms.    . DULoxetine (CYMBALTA) 60 MG capsule Take 1 capsule (60 mg total) by mouth daily. (Patient not taking: Reported on 03/14/2020) 30 capsule 6  . dupilumab (DUPIXENT) 300 MG/2ML prefilled syringe Inject 300 mg into the skin every 14 (fourteen) days. 4 mL 11  . EPINEPHrine 0.3 mg/0.3 mL IJ SOAJ injection Inject 0.3 mLs (0.3 mg total) into the muscle as needed for anaphylaxis. 1 each 1  . Ferrous Sulfate (IRON) 325 (65 Fe) MG TABS Take 1 tablet (325 mg total) by  mouth every morning. (Patient taking differently: Take 1 tablet by mouth daily. ) 30 each 0  . fluticasone (FLONASE) 50 MCG/ACT nasal spray Place 1 spray into both nostrils daily. 30 g 2  . fluticasone furoate-vilanterol (BREO ELLIPTA) 200-25 MCG/INH AEPB Inhale 1 puff then rinse mouth,once daily 60 each 12  . furosemide (LASIX) 40 MG tablet Take 1 tablet (40 mg total) by mouth daily. 30 tablet 6  . gabapentin (NEURONTIN) 600 MG tablet Take 1,200 mg by mouth 3 (three) times daily. (Patient not taking: Reported on 03/14/2020)    . guaiFENesin-codeine 100-10 MG/5ML syrup Take 5 mLs by mouth 3 (three) times daily as needed for cough. 120 mL 0  . hydrALAZINE (APRESOLINE) 10 MG tablet Take 1 tablet (10 mg total) by mouth every 8 (eight) hours for 30 days. 90 tablet 1  . hydrocortisone-pramoxine (ANALPRAM HC) 2.5-1 % rectal cream Place 1 application rectally 3 (three) times daily. 30 g 1  . hydrOXYzine (ATARAX/VISTARIL) 25 MG tablet TAKE 1 TABLET(25 MG) BY MOUTH THREE TIMES DAILY AS NEEDED 90 tablet 1  . ipratropium-albuterol (DUONEB) 0.5-2.5 (3) MG/3ML SOLN Nebulize 1 ampule every 6 hours if needed 360 mL 12  . metoprolol tartrate (LOPRESSOR) 25 MG tablet Take 1 tablet (25 mg total) by mouth 2 (two) times daily. Dx: HTN 60 tablet 6  . montelukast (SINGULAIR) 10 MG tablet Take 1 tablet (10 mg total) by mouth at bedtime. 30 tablet  6  . nicotine (NICODERM CQ - DOSED IN MG/24 HOURS) 21 mg/24hr patch Place 1 patch (21 mg total) onto the skin daily. (Patient not taking: Reported on 12/30/2019) 28 patch 1  . omeprazole (PRILOSEC) 20 MG capsule Take 1 capsule (20 mg total) by mouth 2 (two) times daily before a meal. 60 capsule 6  . oxyCODONE-acetaminophen (PERCOCET) 10-325 MG tablet Take 1 tablet by mouth 5 (five) times daily as needed for pain.    [name] OZEMPIC, 1 MG/DOSE, 2 MG/1.5ML SOPN Inject 1 mg into the skin every Wednesday.     . phentermine 15 MG capsule Take 15 mg by mouth daily.    . potassium chloride SA  (KLOR-CON) 20 MEQ tablet Take 1 tablet (20 mEq total) by mouth daily. 30 tablet 3  . pregabalin (LYRICA) 75 MG capsule Take 75 mg by mouth 2 (two) times daily.    . Vitamin D, Ergocalciferol, (DRISDOL) 1.25 MG (50000 UT) CAPS capsule TAKE 1 CAPSULE BY MOUTH 1 TIME A WEEK (Patient taking differently: Take 50,000 Units by mouth every Wednesday. ) 9 capsule 1  . VYVANSE 70 MG capsule Take 70 mg by mouth daily.    [name] zolpidem (AMBIEN) 5 MG tablet 1 at bedtime as needed for sleep 30 tablet 5  . [DISCONTINUED] mometasone-formoterol (DULERA) 200-5 MCG/ACT AERO Inhale 2 puffs into the lungs 2 (two) times daily. Dx: Asthma 8.8 g 3   No current facility-administered medications on file prior to visit.     Immunization History  Administered Date(s) Administered  . Influenza Split 01/08/2012  . Influenza Whole 03/03/2009, 12/27/2009, 12/19/2010  . Influenza, High Dose Seasonal PF 01/12/2019  . Influenza,inj,Quad PF,6+ Mos 12/29/2012, 12/31/2013, 03/07/2015, 01/23/2016, 01/06/2017, 01/11/2020  . PPD Test 09/25/2011, 07/17/2012, 06/25/2013, 05/21/2016, 11/11/2017  . Pneumococcal Polysaccharide-23 03/17/2009, 07/06/2016, 10/20/2016  . Tdap 12/19/2010

## 2020-03-21 NOTE — Telephone Encounter (Signed)
She can extend Prednisone 20mg  daily for 5 days then 10mg  daily for 5 days and then stop .  Continue on BREO and duoneb .  If not better needs ov   Make sure she has follow up with Dr. Annamaria Boots   Please contact office for sooner follow up if symptoms do not improve or worsen or seek emergency care

## 2020-03-22 NOTE — Telephone Encounter (Signed)
Note made in appointment note of 1130 appointment with Dr Annamaria Boots.  Patient can receive Dupixent after OV.

## 2020-03-22 NOTE — Telephone Encounter (Addendum)
Called and spoke to pt. Informed her of the recs per TP. Pt verbalized understanding and states she picked up her pred last night and will start it today. Appt scheduled with Dr. Annamaria Boots for 12/3. Pt also has her dupixent injection on 12/3. Pt has been complaint with her Dupixent.   Will forward to injection pool to see if pt's appt can be moved up soon since her appt with CY is at 1130 and her injection is at 330.

## 2020-03-22 NOTE — Telephone Encounter (Signed)
Ok to repeat her recent prednisone taper to get her through Thanksgiving. Can we tell if she is compliant with Dupixent????

## 2020-03-28 ENCOUNTER — Telehealth: Payer: Self-pay | Admitting: Internal Medicine

## 2020-03-30 ENCOUNTER — Encounter: Payer: Self-pay | Admitting: Family Medicine

## 2020-03-30 ENCOUNTER — Other Ambulatory Visit (HOSPITAL_COMMUNITY)
Admission: RE | Admit: 2020-03-30 | Discharge: 2020-03-30 | Disposition: A | Discharge status: Home / Self Care | Payer: Medicare Other | Source: Ambulatory Visit | Attending: Family Medicine | Admitting: Family Medicine

## 2020-03-30 ENCOUNTER — Ambulatory Visit: Payer: Medicare Other | Attending: Family Medicine | Admitting: Family Medicine

## 2020-03-30 ENCOUNTER — Other Ambulatory Visit: Payer: Self-pay

## 2020-03-30 VITALS — BP 121/82 | HR 89 | Ht 66.0 in | Wt 300.0 lb

## 2020-03-30 DIAGNOSIS — Z113 Encounter for screening for infections with a predominantly sexual mode of transmission: Secondary | ICD-10-CM

## 2020-03-30 DIAGNOSIS — Z124 Encounter for screening for malignant neoplasm of cervix: Secondary | ICD-10-CM | POA: Diagnosis present

## 2020-03-30 DIAGNOSIS — Z0000 Encounter for general adult medical examination without abnormal findings: Secondary | ICD-10-CM

## 2020-03-30 DIAGNOSIS — Z1151 Encounter for screening for human papillomavirus (HPV): Secondary | ICD-10-CM | POA: Insufficient documentation

## 2020-03-30 DIAGNOSIS — Z1231 Encounter for screening mammogram for malignant neoplasm of breast: Secondary | ICD-10-CM | POA: Diagnosis not present

## 2020-03-30 DIAGNOSIS — Z13228 Encounter for screening for other metabolic disorders: Secondary | ICD-10-CM | POA: Diagnosis not present

## 2020-03-30 DIAGNOSIS — B373 Candidiasis of vulva and vagina: Secondary | ICD-10-CM | POA: Diagnosis not present

## 2020-03-30 NOTE — Patient Instructions (Signed)
  April [name] , Thank you for taking time to come for your Medicare Wellness Visit. I appreciate your ongoing commitment to your health goals. Please review the following plan we discussed and let me know if I can assist you in the future.   These are the goals we discussed: Goals    . Blood Pressure < 140/90       This is a list of the screening recommended for you and due dates:  Health Maintenance  Topic Date Due  . COVID-19 Vaccine (1) Never done  . Pap Smear  Never done  . Eye exam for diabetics  07/08/2019  . Hemoglobin A1C  09/21/2019  . Tetanus Vaccine  12/18/2020  . Complete foot exam   03/30/2021  . Flu Shot  Completed  . Pneumococcal vaccine  Completed  .  Hepatitis C: One time screening is recommended by Center for Disease Control  (CDC) for  adults born from [birth year] through 1963.   Completed  . HIV Screening  Completed

## 2020-03-30 NOTE — Telephone Encounter (Signed)
Dupixent Order: 300mg  #2 prefilled syringe Ordered Date: 03/30/20 Expected date of arrival: 04/04/20 Ordered by: [facility]

## 2020-03-30 NOTE — Progress Notes (Signed)
Subjective:   April [name] is a 49 y.o. female who presents for Medicare Annual (Subsequent) preventive examination.  Review of Systems    General: negative for fever, weight loss, appetite change Eyes: no visual symptoms. ENT: no ear symptoms, no sinus tenderness, no nasal congestion or sore throat. Neck: no pain  Respiratory: +wheezing, no shortness of breath, + cough Cardiovascular: no chest pain, no dyspnea on exertion, no pedal edema, no orthopnea. Gastrointestinal: no abdominal pain, no diarrhea, no constipation Genito-Urinary: no urinary frequency, no dysuria, no polyuria. Hematologic: no bruising Endocrine: no cold or heat intolerance Neurological: no headaches, no seizures, no tremors Musculoskeletal: + joint pains, no joint swelling Skin: no pruritus, no rash. Psychological: no depression, no anxiety,          Objective:    Today's Vitals   03/30/20 1033  BP: 121/82  Pulse: 89  SpO2: 99%  Weight: 300 lb (136.1 kg)  Height: 5\' 6"  (1.676 m)   Body mass index is 48.42 kg/m.  Advanced Directives 03/30/2020 02/20/2019 10/26/2018 09/30/2018 08/02/2018 05/16/2018 03/12/2018  Does Patient Have a Medical Advance Directive? No No No No No No No  Would patient like information on creating a medical advance directive? - No - Patient declined No - Patient declined - No - Patient declined No - Patient declined -  Pre-existing out of facility DNR order (yellow form or pink MOST form) - - - - - - -  Some encounter information is confidential and restricted. Go to Review Flowsheets activity to see all data.    Current Medications (verified) Outpatient Encounter Medications as of 03/30/2020  Medication Sig  . apixaban (ELIQUIS) 5 MG TABS tablet TAKE 1 TABLET(5 MG) BY MOUTH TWICE DAILY  . cloNIDine (CATAPRES) 0.3 MG tablet Take 1 tablet (0.3 mg total) by mouth at bedtime. For hot flashes  . dupilumab (DUPIXENT) 300 MG/2ML prefilled syringe Inject 300 mg into the skin every 14  (fourteen) days.  [name] EPINEPHrine 0.3 mg/0.3 mL IJ SOAJ injection Inject 0.3 mLs (0.3 mg total) into the muscle as needed for anaphylaxis.  . fluticasone (FLONASE) 50 MCG/ACT nasal spray Place 1 spray into both nostrils daily.  . fluticasone furoate-vilanterol (BREO ELLIPTA) 200-25 MCG/INH AEPB Inhale 1 puff then rinse mouth,once daily  . furosemide (LASIX) 40 MG tablet Take 1 tablet (40 mg total) by mouth daily.  . hydrALAZINE (APRESOLINE) 10 MG tablet Take 1 tablet (10 mg total) by mouth every 8 (eight) hours for 30 days.  . hydrocortisone-pramoxine (ANALPRAM HC) 2.5-1 % rectal cream Place 1 application rectally 3 (three) times daily.  . hydrOXYzine (ATARAX/VISTARIL) 25 MG tablet TAKE 1 TABLET(25 MG) BY MOUTH THREE TIMES DAILY AS NEEDED  . ipratropium-albuterol (DUONEB) 0.5-2.5 (3) MG/3ML SOLN Nebulize 1 ampule every 6 hours if needed  . metoprolol tartrate (LOPRESSOR) 25 MG tablet Take 1 tablet (25 mg total) by mouth 2 (two) times daily. Dx: HTN  . montelukast (SINGULAIR) 10 MG tablet Take 1 tablet (10 mg total) by mouth at bedtime.  [name] omeprazole (PRILOSEC) 20 MG capsule Take 1 capsule (20 mg total) by mouth 2 (two) times daily before a meal.  . oxyCODONE-acetaminophen (PERCOCET) 10-325 MG tablet Take 1 tablet by mouth 5 (five) times daily as needed for pain.  [name] OZEMPIC, 1 MG/DOSE, 2 MG/1.5ML SOPN Inject 1 mg into the skin every Wednesday.   . phentermine 15 MG capsule Take 15 mg by mouth daily.  . pregabalin (LYRICA) 75 MG capsule Take 75 mg by mouth  2 (two) times daily.  . Vitamin D, Ergocalciferol, (DRISDOL) 1.25 MG (50000 UT) CAPS capsule TAKE 1 CAPSULE BY MOUTH 1 TIME A WEEK (Patient taking differently: Take 50,000 Units by mouth every Wednesday. )  . VYVANSE 70 MG capsule Take 70 mg by mouth daily.  [name] zolpidem (AMBIEN) 5 MG tablet 1 at bedtime as needed for sleep  . albuterol (PROAIR HFA) 108 (90 Base) MCG/ACT inhaler Inhale 2 puffs every 6 hourss if needed- rescue  . cephALEXin (KEFLEX)  500 MG capsule Take 1 capsule (500 mg total) by mouth 3 (three) times daily. (Patient not taking: Reported on 03/30/2020)  . cyclobenzaprine (FLEXERIL) 10 MG tablet Take 10 mg by mouth 3 (three) times daily as needed for muscle spasms. (Patient not taking: Reported on 03/30/2020)  . DULoxetine (CYMBALTA) 60 MG capsule Take 1 capsule (60 mg total) by mouth daily. (Patient not taking: Reported on 03/14/2020)  . Ferrous Sulfate (IRON) 325 (65 Fe) MG TABS Take 1 tablet (325 mg total) by mouth every morning. (Patient not taking: Reported on 03/30/2020)  . gabapentin (NEURONTIN) 600 MG tablet Take 1,200 mg by mouth 3 (three) times daily. (Patient not taking: Reported on 03/14/2020)  . guaiFENesin-codeine 100-10 MG/5ML syrup Take 5 mLs by mouth 3 (three) times daily as needed for cough. (Patient not taking: Reported on 03/30/2020)  . nicotine (NICODERM CQ - DOSED IN MG/24 HOURS) 21 mg/24hr patch Place 1 patch (21 mg total) onto the skin daily. (Patient not taking: Reported on 12/30/2019)  . potassium chloride SA (KLOR-CON) 20 MEQ tablet Take 1 tablet (20 mEq total) by mouth daily. (Patient not taking: Reported on 03/30/2020)  . predniSONE (DELTASONE) 10 MG tablet Take 2 tablets (20 mg total) by mouth daily with breakfast for 5 days, THEN 1 tablet (10 mg total) daily with breakfast for 5 days. Then stop. (Patient not taking: Reported on 03/30/2020)  . [DISCONTINUED] mometasone-formoterol (DULERA) 200-5 MCG/ACT AERO Inhale 2 puffs into the lungs 2 (two) times daily. Dx: Asthma   No facility-administered encounter medications on file as of 03/30/2020.    Allergies (verified) Contrast media [iodinated diagnostic agents]   History: Past Medical History:  Diagnosis Date  . Acanthosis nigricans   . Anxiety   . Arthritis    "knees" (04/28/2017)  . Asthma    Followed by Dr. Annamaria Boots (pulmonology); receives every other week omalizumab injections; has frequent exacerbations  . Chronic diastolic CHF (congestive heart  failure) ([place]) 01/17/2017  . COPD (chronic obstructive pulmonary disease) ([place])    PFTs in 2002, FEV1/FVC 65, no post bronchodilater test done  . Depression   . GERD (gastroesophageal reflux disease)   . Headache(784.0)    "q couple days" (04/28/2017)  . Helicobacter pylori (H. pylori) infection   . Hypertension, essential   . Insomnia   . Menorrhagia   . Morbid obesity ([place])   . OSA on CPAP    Sleep study 2008 - mild OSA, not enough events to titrate CPAP; wears CPAP now/pt on 04/28/2017  . Pneumonia X 1  . Seasonal allergies   . Shortness of breath   . Tobacco user    Past Surgical History:  Procedure Laterality Date  . CARDIOVERSION N/A 05/30/2017   Procedure: CARDIOVERSION;  Surgeon: Sanda Klein, MD;  Location: [facility];  Service: Cardiovascular;  Laterality: N/A;  . REDUCTION MAMMAPLASTY Bilateral 09/2011  . TUBAL LIGATION  1996   bilateral   Family History  Problem Relation Age of Onset  . Hypertension Mother   .  Asthma Daughter   . Cancer Paternal Aunt   . Asthma Maternal Grandmother    Social History   Socioeconomic History  . Marital status: Single    Spouse name: Not on file  . Number of children: 1  . Years of education: Not on file  . Highest education level: Not on file  Occupational History  . Occupation: CNA  Tobacco Use  . Smoking status: Former Smoker    Packs/day: 0.50    Years: 26.00    Pack years: 13.00    Types: Cigarettes    Quit date: 09/12/2014    Years since quitting: 5.5  . Smokeless tobacco: Never Used  Vaping Use  . Vaping Use: Never used  Substance and Sexual Activity  . Alcohol use: No  . Drug use: No  . Sexual activity: Never  Other Topics Concern  . 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   Social Determinants of Health   Financial Resource Strain:   . Difficulty of Paying Living Expenses: Not on file  Food Insecurity:   . Worried About Charity fundraiser in the  Last Year: Not on file  . Ran Out of Food in the Last Year: Not on file  Transportation Needs:   . Lack of Transportation (Medical): Not on file  . Lack of Transportation (Non-Medical): Not on file  Physical Activity:   . Days of Exercise per Week: Not on file  . Minutes of Exercise per Session: Not on file  Stress:   . Feeling of Stress : Not on file  Social Connections:   . Frequency of Communication with Friends and Family: Not on file  . Frequency of Social Gatherings with Friends and Family: Not on file  . Attends Religious Services: Not on file  . Active Member of Clubs or Organizations: Not on file  . Attends Archivist Meetings: Not on file  . Marital Status: Not on file    Tobacco Counseling Counseling given: Not Answered   Clinical Intake:  Pre-visit preparation completed: No  Pain : 0-10 Pain Location: Leg Pain Orientation: Left, Right      Interpreter Needed?: No      Activities of Daily Living In your present state of health, do you have any difficulty performing the following activities: 03/30/2020  Hearing? N  Vision? N  Difficulty concentrating or making decisions? N  Walking or climbing stairs? N  Dressing or bathing? N  Doing errands, shopping? N  Preparing Food and eating ? N  Using the Toilet? N  In the past six months, have you accidently leaked urine? N  Do you have problems with loss of bowel control? N  Managing your Medications? N  Managing your Finances? N  Housekeeping or managing your Housekeeping? N  Some recent data might be hidden    Patient Care Team: [name], MD as PCP - General (Family Medicine) Croitoru, Mihai, MD as PCP - Cardiology (Cardiology)  Constitutional: normal appearing, Obese Eyes: PERRLA HEENT: Head is atraumatic, normal sinuses, normal oropharynx, normal appearing tonsils and palate, tympanic membrane is normal bilaterally. Neck: normal range of motion, no thyromegaly, no JVD Breast:  Healed vertical surgical scars from breast reduction surgery.  No breast masses, no tenderness to palpation Cardiovascular: normal rate and rhythm, normal heart sounds, no murmurs, rub or gallop, no pedal edema Respiratory: Normal breath sounds, clear to auscultation bilaterally, + chronic wheezewheezes, no rales, no  rhonchi Abdomen: soft, not tender to palpation, normal bowel sounds, no enlarged organs Musculoskeletal: Full ROM, no tenderness in joints Skin: warm and dry, no lesions. GU: External genitalia, vagina, cervix-normal Neurological: alert, oriented x3, cranial nerves I-XII grossly intact , normal motor strength, normal sensation. Psychological: normal mood.   Indicate any recent Medical Services you may have received from other than [facility] providers in the past year (date may be approximate).     Assessment:   This is a routine wellness examination for April [name].  Hearing/Vision screen No exam data present  Dietary issues and exercise activities discussed: Current Exercise Habits: The patient does not participate in regular exercise at present  Goals    . Blood Pressure < 140/90      Depression Screen PHQ 2/9 Scores 03/30/2020 12/30/2019 08/05/2019 03/24/2019 06/22/2018 05/25/2018 01/22/2018  PHQ - 2 Score 4 3 3  0 4 5 4   PHQ- 9 Score 13 9 5  0 15 13 13   Some encounter information is confidential and restricted. Go to Review Flowsheets activity to see all data.    Fall Risk Fall Risk  03/30/2020 02/23/2020 11/09/2018 08/12/2018 06/22/2018  Falls in the past year? 0 0 0 0 0  Number falls in past yr: 0 - - - -  Injury with Fall? 0 - - - -  Risk for fall due to : - - - - -  Risk for fall due to: Comment - - - - -  Follow up - - - - -  Some encounter information is confidential and restricted. Go to Review Flowsheets activity to see all data.    Any stairs in or around the home? No  If so, are there any without handrails? n/a Home free of loose throw rugs in walkways, pet beds,  electrical cords, etc? Yes  Adequate lighting in your home to reduce risk of falls? Yes   ASSISTIVE DEVICES UTILIZED TO PREVENT FALLS:  Life alert? No  Use of a cane, walker or w/c? Yes  Grab bars in the bathroom? Yes  Shower chair or bench in shower? No  Elevated toilet seat or a handicapped toilet? Yes   TIMED UP AND GO:  Was the test performed? Yes .  Length of time to ambulate 10 feet: 15 sec.   Gait slow and steady without use of assistive device  Cognitive Function:        Immunizations Immunization History  Administered Date(s) Administered  . Influenza Split 01/08/2012  . Influenza Whole 03/03/2009, 12/27/2009, 12/19/2010  . Influenza, High Dose Seasonal PF 01/12/2019  . Influenza,inj,Quad PF,6+ Mos 12/29/2012, 12/31/2013, 03/07/2015, 01/23/2016, 01/06/2017, 01/11/2020  . PPD Test 09/25/2011, 07/17/2012, 06/25/2013, 05/21/2016, 11/11/2017  . Pneumococcal Polysaccharide-23 03/17/2009, 07/06/2016, 10/20/2016  . Tdap 12/19/2010    Screening Tests Health Maintenance  Topic Date Due  . FOOT EXAM  Never done  . COVID-19 Vaccine (1) Never done  . PAP SMEAR-Modifier  Never done  . OPHTHALMOLOGY EXAM  07/08/2019  . HEMOGLOBIN A1C  09/21/2019  . TETANUS/TDAP  12/18/2020  . INFLUENZA VACCINE  Completed  . PNEUMOCOCCAL POLYSACCHARIDE VACCINE AGE 47-64 HIGH RISK  Completed  . Hepatitis C Screening  Completed  . HIV Screening  Completed    Health Maintenance  Health Maintenance Due  Topic Date Due  . FOOT EXAM  Never done  . COVID-19 Vaccine (1) Never done  . PAP SMEAR-Modifier  Never done  . OPHTHALMOLOGY EXAM  07/08/2019  . HEMOGLOBIN A1C  09/21/2019    Colorectal cancer screening:  Completed 5. Repeat every 5 years Mammogram status: Ordered today. Pt provided with contact info and advised to call to schedule appt.      Additional Screening:  Hepatitis C Screening: does qualify; Completed   Vision Screening: Recommended annual ophthalmology exams for  early detection of glaucoma and other disorders of the eye. Is the patient up to date with their annual eye exam?  No  Who is the provider or what is the name of the office in which the patient attends annual eye exams? Dr Katy Fitch If pt is not established with a provider, would they like to be referred to a provider to establish care? n/a.   Dental Screening: Recommended annual dental exams for proper oral hygiene  Community Resource Referral / Chronic Care Management: CRR required this visit?  No   CCM required this visit?  No      Plan:    1. Encounter for Medicare annual wellness exam Counseled on 150 minutes of exercise per week, healthy eating (including decreased daily intake of saturated fats, cholesterol, added sugars, sodium), routine healthcare maintenance. - MM 3D SCREEN BREAST BILATERAL; Future  2. Screening for cervical cancer - Cytology - PAP([place])  3. Encounter for screening mammogram for malignant neoplasm of breast Mammogram order placed  4. Screening for STD (sexually transmitted disease) - Cervicovaginal ancillary only  5. Screening for metabolic disorder - Lipid panel   I have personally reviewed and noted the following in the patient's chart:   . Medical and social history . Use of alcohol, tobacco or illicit drugs  . Current medications and supplements . Functional ability and status . Nutritional status . Physical activity . Advanced directives . List of other physicians . Hospitalizations, surgeries, and ER visits in previous 12 months . Vitals . Screenings to include cognitive, depression, and falls . Referrals and appointments  In addition, I have reviewed and discussed with patient certain preventive protocols, quality metrics, and best practice recommendations. A written personalized care plan for preventive services as well as general preventive health recommendations were provided to patient.     [name], MD   03/30/2020

## 2020-03-30 NOTE — Progress Notes (Signed)
Patient ID: April [name], female    DOB: Mar 10, 1971, 49 y.o.   MRN: [id number]   HPI F former smoker followed for chronic severe asthma/ Xolair complicated by non-compliance,  OSA/ CPAP,  morbid obesity, HBP, depression, GERD, AFib/ Eliquis  NPSG 03/31/07- AHI 5.8/ hr, weight 330 lbs. She canceled planned more recent study. Office spirometry 10/24/14- severe obstructive airways disease. FVC 2.10/66%, FEV1 1.25/47%, FEV1/FVC 0.59, FEF 25-75 percent 0.64/19% Unattended HST 07/06/16- AHI 11/hour, desaturation to 74%. Office Spirometry 07/29/2016-moderately severe obstructive airways disease. FVC 2.21/68%, FEV1 1.53/57%, ratio 0.69, FEF 25-75% 0.98/34% Prolonged hosp [facility] 4/19- tracheostomy. Dupixent enrolled 10/22/2018 HST 12/26/2018- AHI 9.7/ hr, desaturation to 9.7/ hr, desaturation to 69% -------------------------------------------------------   12/13/19- 49 year old female former smoker followed for chronic severe asthma/Xolair, Tracheostomy at [facility] 1937,  complicated by noncompliance, OSA/ oral appliance, morbid obesity, HBP, depression, GERD Dupixent enrolled 10/22/2018 Had failed CPAP- intol- has oral appliance Body weight today- Neb albuterol/ ipratropium, Ventolin HFA, Dupixent, Breo 100, ambien 5, She reports getting 2 Covid vax at [facility]. We called and they have no record of this. I didn't challenge her, but emphasized importance of covid vaccine now, flu vax in Fall. Has some left-over prednisone 5 mg tabs and says taking one occ on bad days helps control Asthma. No ER visits. Needs help continuing [place]- transportation is a problem and PA for Dupixent has been applied. Not confident she is reliable for self-injection, but may need to try it.  She wants local Bariatric Surgery referral.  03/31/20- 49 year old female former smoker followed for chronic severe asthma/Xolair, Tracheostomy at [facility],  complicated by noncompliance, OSA/ oral appliance, morbid obesity, HBP,  depression, GERD Dupixent enrolled 10/22/2018-- every 2 weeks Ventolin hfa, Breo 200, Singulair Had failed CPAP- intol- has oral appliance ED- 03/14/20- Asthma exacerbation>>nebs, decadron, ------Moderate asthma and OSA- wheezing and prod cough with yellow sputum for the past couple of weeks. She states not sleeping well on ambien 5 mg. She is using her albuterol inhaler 3 x daily on average and neb about 2 x per day.  CXR 1V- 03/18/20-  Covid vax- none documented but she claims. Flu vax- had Caught a "cold" about 2 weeks ago. Nasal and chest congestion, cough, light yellow mucus, no fever. Slowly better.Wants to avoid ER.  Also says ambien not helping insomnia. Willing to retry clonazepam.   Review of Systems-See HPI  + = positive Constitutional:   + weight loss, night sweats, fevers, chills, +fatigue, lassitude. HEENT:   No-  headaches, difficulty swallowing, tooth/dental problems, sore throat,    sneezing, itching, ear ache, +nasal congestion, post nasal drip,  CV:  No-   chest pain, orthopnea, PND, swelling in lower extremities, anasarca, dizziness, palpitations Resp: +shortness of breath with exertion or at rest.              productive cough,  + non-productive cough,  No- coughing up of blood.              No-   change in color of mucus. wheezing.   Skin: No-   rash or lesions. GI:  No-   heartburn, indigestion, abdominal pain, nausea, vomiting,  GU: MS:  No-   joint pain or swelling.   Neuro-     nothing unusual Psych:  No- change in mood or affect. No depression or anxiety.  No memory loss.    Objective:   Physical Exam    General- Alert, Oriented, Affect-appropriate, Distress- none acute, + morbid  obesity,  Skin- rash-none, lesions- none, excoriation- none Lymphadenopathy- none Head- atraumatic            Eyes- Gross vision intact, PERRLA, conjunctivae clear secretions            Ears- Hearing, canals-normal            Nose- Clear, no-Septal dev, mucus, polyps, erosion,  perforation. + sniffing            Throat- Mallampati III , mucosa clear , drainage- none, tonsils- atrophic,                           + Missing  teeth, +upper denture plate is out                  Neck- flexible , trachea+ stoma scar, no stridor , thyroid nl, carotid no bruit Chest - symmetrical excursion , unlabored           Heart/CV- RRR , no murmur , no gallop  , no rub, nl s1 s2                           - JVD- none , edema- none, stasis changes- none, varices- none           Lung- +Diminished/unlabored.  Wheeze + bases, cough+,                                               dullness-none,  rub- none           Chest wall-  Abd-  Br/ Gen/ Rectal- Not done, not indicated Extrem- cyanosis- none, clubbing, none, atrophy- none, strength- nl Neuro- grossly intact to observation

## 2020-03-31 ENCOUNTER — Ambulatory Visit (INDEPENDENT_AMBULATORY_CARE_PROVIDER_SITE_OTHER): Payer: Medicare Other | Admitting: Internal Medicine

## 2020-03-31 ENCOUNTER — Ambulatory Visit: Payer: Medicare Other

## 2020-03-31 ENCOUNTER — Encounter: Payer: Self-pay | Admitting: Internal Medicine

## 2020-03-31 VITALS — BP 124/78 | HR 101 | Temp 97.2°F | Ht 66.0 in | Wt 294.0 lb

## 2020-03-31 DIAGNOSIS — J4541 Moderate persistent asthma with (acute) exacerbation: Secondary | ICD-10-CM | POA: Diagnosis not present

## 2020-03-31 DIAGNOSIS — F5101 Primary insomnia: Secondary | ICD-10-CM | POA: Diagnosis not present

## 2020-03-31 DIAGNOSIS — E7800 Pure hypercholesterolemia, unspecified: Secondary | ICD-10-CM | POA: Diagnosis not present

## 2020-03-31 DIAGNOSIS — J4551 Severe persistent asthma with (acute) exacerbation: Secondary | ICD-10-CM | POA: Diagnosis not present

## 2020-03-31 DIAGNOSIS — M79672 Pain in left foot: Secondary | ICD-10-CM | POA: Diagnosis not present

## 2020-03-31 DIAGNOSIS — Z79899 Other long term (current) drug therapy: Secondary | ICD-10-CM | POA: Diagnosis not present

## 2020-03-31 DIAGNOSIS — M170 Bilateral primary osteoarthritis of knee: Secondary | ICD-10-CM | POA: Diagnosis not present

## 2020-03-31 DIAGNOSIS — M79604 Pain in right leg: Secondary | ICD-10-CM | POA: Diagnosis not present

## 2020-03-31 DIAGNOSIS — M79605 Pain in left leg: Secondary | ICD-10-CM | POA: Diagnosis not present

## 2020-03-31 DIAGNOSIS — M79671 Pain in right foot: Secondary | ICD-10-CM | POA: Diagnosis not present

## 2020-03-31 LAB — LIPID PANEL
Chol/HDL Ratio: 5.3 ratio — ABNORMAL HIGH (ref 0.0–4.4)
Cholesterol, Total: 238 mg/dL — ABNORMAL HIGH (ref 100–199)
HDL: 45 mg/dL (ref >39)
LDL Chol Calc (NIH): 167 mg/dL — ABNORMAL HIGH (ref 0–99)
Triglycerides: 141 mg/dL (ref 0–149)
VLDL Cholesterol Cal: 26 mg/dL (ref 5–40)

## 2020-03-31 LAB — CERVICOVAGINAL ANCILLARY ONLY
Bacterial Vaginitis (gardnerella): NEGATIVE
Candida Glabrata: POSITIVE — AB
Candida Vaginitis: POSITIVE — AB
Chlamydia: NEGATIVE
Comment: NEGATIVE
Comment: NEGATIVE
Comment: NEGATIVE
Comment: NEGATIVE
Comment: NEGATIVE
Comment: NORMAL
Neisseria Gonorrhea: NEGATIVE
Trichomonas: NEGATIVE

## 2020-03-31 MED ORDER — CLONAZEPAM 1 MG PO TABS: ORAL_TABLET | ORAL | 5 refills | Status: DC

## 2020-03-31 MED ORDER — METHYLPREDNISOLONE ACETATE 80 MG/ML IJ SUSP
80.0000 mg | Freq: Once | INTRAMUSCULAR | Status: AC
  Administered 2020-03-31: 80 mg via INTRAMUSCULAR

## 2020-03-31 MED ORDER — DUPILUMAB 300 MG/2ML ~~LOC~~ SOSY
300.0000 mg | PREFILLED_SYRINGE | Freq: Once | SUBCUTANEOUS | Status: AC
  Administered 2020-03-31: 300 mg via SUBCUTANEOUS

## 2020-03-31 MED ORDER — TRELEGY ELLIPTA 200-62.5-25 MCG/INH IN AEPB: 1.0000 | INHALATION_SPRAY | Freq: Every day | RESPIRATORY_TRACT | 0 refills | Status: DC

## 2020-03-31 MED ORDER — PREDNISONE 10 MG PO TABS: ORAL_TABLET | ORAL | 0 refills | Status: DC

## 2020-03-31 NOTE — Patient Instructions (Addendum)
Sample x 2 Trelegy 200    Inhale 1 puff then rinse mouth well, once daily.   Try this instead of Breo. Then go back to [facility] after samples used up.  Order- depo 80     Dx asthma exacerbation  Script sent for prednisone taper to hold  Script sent for clonazepam for sleep. Try this instead of ambien. Try taking clonazepam about 30 minutes before bed.  Please call as needed

## 2020-03-31 NOTE — Assessment & Plan Note (Signed)
Exacerbation associated with what appears to be a standard viral URI., gradually improving. She actively wants to avoid steroids and ER when possible, but agrees choices limited. Using meds as directed. Plan- Continue Dupixent. Try Trelegy 200 samples. Pred taper to hold. Early return ov.

## 2020-03-31 NOTE — Assessment & Plan Note (Signed)
Chronic problem, worse with stress.  Plan- Discussed sleep hygiene. Re-try clonazepam.

## 2020-04-03 LAB — CYTOLOGY - PAP
Adequacy: ABSENT
Comment: NEGATIVE
Diagnosis: NEGATIVE
High risk HPV: NEGATIVE

## 2020-04-04 ENCOUNTER — Telehealth: Payer: Self-pay

## 2020-04-04 ENCOUNTER — Other Ambulatory Visit: Payer: Self-pay | Admitting: Family Medicine

## 2020-04-04 MED ORDER — ATORVASTATIN CALCIUM 20 MG PO TABS: 20.0000 mg | ORAL_TABLET | Freq: Every day | ORAL | 6 refills | Status: DC

## 2020-04-04 MED ORDER — FLUCONAZOLE 150 MG PO TABS: 150.0000 mg | ORAL_TABLET | Freq: Once | ORAL | 0 refills | Status: AC

## 2020-04-04 NOTE — Telephone Encounter (Signed)
Pt given lab results per notes of Dr. Margarita Rana on 04/04/20. Pt verbalized understanding.

## 2020-04-04 NOTE — Telephone Encounter (Signed)
-----   Message from [name], MD sent at 04/04/2020  8:37 AM EST ----- Cholesterol is elevated and I have placed her on Atorvastatin and she will need to comply with a low cholesterol diet. PAP smear is normal; she does have a yeast infection and I have sent a rx for Diflucan to her Pharmacy.

## 2020-04-04 NOTE — Telephone Encounter (Signed)
Dupixent Shipment Received: 300mg  #2 prefilled syringe Medication arrival date: 04/04/20 Lot #: [id number] Exp date: 09/27/2022 Received by: Elliot Dally

## 2020-04-04 NOTE — Telephone Encounter (Signed)
Patient was called and a voicemail was left informing patient to return phone call for lab results. 

## 2020-04-06 ENCOUNTER — Telehealth: Payer: Self-pay

## 2020-04-06 ENCOUNTER — Telehealth: Payer: Self-pay | Admitting: Internal Medicine

## 2020-04-06 MED ORDER — TRELEGY ELLIPTA 100-62.5-25 MCG/INH IN AEPB: 1.0000 | INHALATION_SPRAY | Freq: Every day | RESPIRATORY_TRACT | 5 refills | Status: DC

## 2020-04-06 NOTE — Telephone Encounter (Signed)
Called and spoke with Patient. Trelegy prescription sent to requested [facility]. Nothing further at this time.

## 2020-04-06 NOTE — Telephone Encounter (Signed)
Please order Trelegy 100, # 60, ref x 5    inhale 1 puff then rinse mouth, once daily  Thanks

## 2020-04-06 NOTE — Telephone Encounter (Signed)
Copied from [place] [id number]. Topic: Quick Communication - See Telephone Encounter >> Apr 06, 2020 12:09 PM [place] wrote: CRM for notification. See Telephone encounter for: 04/06/20.apixaban (ELIQUIS) 5 MG TABS tablet Medication Date: 02/23/2020 Department: [place] Ordering/Authorizing: [name], MD  Pt needs a cb as does not know why takes this med and thinks she does not need it FU at [phone]   Please advice

## 2020-04-06 NOTE — Telephone Encounter (Signed)
Called and spoke with Patient.  Patient stated Trelegy is working great.  Patient stated she has no wheezing, decrease sob, and cough.  Patient stated she still has some Breo left, but would like a prescription sent for Trelegy to [facility].  Message routed to Dr. Annamaria Boots to advise  LOV 03/31/20-  Instructions    Return in about 1 month (around 05/01/2020). Sample x 2 Trelegy 200    Inhale 1 puff then rinse mouth well, once daily.   Try this instead of Breo. Then go back to [facility] after samples used up.  Order- depo 80     Dx asthma exacerbation  Script sent for prednisone taper to hold  Script sent for clonazepam for sleep. Try this instead of ambien. Try taking clonazepam about 30 minutes before bed.  Please call as needed     Allergies  Allergen Reactions  . Contrast Media [Iodinated Diagnostic Agents] Itching    Ct contrast   Current Outpatient Medications on File Prior to Visit  Medication Sig Dispense Refill  . albuterol (PROAIR HFA) 108 (90 Base) MCG/ACT inhaler Inhale 2 puffs every 6 hourss if needed- rescue 18 g 12  . apixaban (ELIQUIS) 5 MG TABS tablet TAKE 1 TABLET(5 MG) BY MOUTH TWICE DAILY 60 tablet 6  . atorvastatin (LIPITOR) 20 MG tablet Take 1 tablet (20 mg total) by mouth daily. 30 tablet 6  . clonazePAM (KLONOPIN) 1 MG tablet 1 tab at bedtime for sleep as needed 30 tablet 5  . cloNIDine (CATAPRES) 0.3 MG tablet Take 1 tablet (0.3 mg total) by mouth at bedtime. For hot flashes 30 tablet 6  . DULoxetine (CYMBALTA) 60 MG capsule Take 1 capsule (60 mg total) by mouth daily. 30 capsule 6  . dupilumab (DUPIXENT) 300 MG/2ML prefilled syringe Inject 300 mg into the skin every 14 (fourteen) days. 4 mL 11  . EPINEPHrine 0.3 mg/0.3 mL IJ SOAJ injection Inject 0.3 mLs (0.3 mg total) into the muscle as needed for anaphylaxis. 1 each 1  . fluticasone (FLONASE) 50 MCG/ACT nasal spray Place 1 spray into both nostrils daily. 30 g 2  . Fluticasone-Umeclidin-Vilant  (TRELEGY ELLIPTA) 200-62.5-25 MCG/INH AEPB Inhale 1 puff into the lungs daily. 14 each 0  . furosemide (LASIX) 40 MG tablet Take 1 tablet (40 mg total) by mouth daily. 30 tablet 6  . hydrALAZINE (APRESOLINE) 10 MG tablet Take 1 tablet (10 mg total) by mouth every 8 (eight) hours for 30 days. 90 tablet 1  . hydrOXYzine (ATARAX/VISTARIL) 25 MG tablet TAKE 1 TABLET(25 MG) BY MOUTH THREE TIMES DAILY AS NEEDED 90 tablet 1  . ipratropium-albuterol (DUONEB) 0.5-2.5 (3) MG/3ML SOLN Nebulize 1 ampule every 6 hours if needed 360 mL 12  . metoprolol tartrate (LOPRESSOR) 25 MG tablet Take 1 tablet (25 mg total) by mouth 2 (two) times daily. Dx: HTN 60 tablet 6  . montelukast (SINGULAIR) 10 MG tablet Take 1 tablet (10 mg total) by mouth at bedtime. 30 tablet 6  . nicotine (NICODERM CQ - DOSED IN MG/24 HOURS) 21 mg/24hr patch Place 1 patch (21 mg total) onto the skin daily. (Patient not taking: Reported on 03/31/2020) 28 patch 1  . omeprazole (PRILOSEC) 20 MG capsule Take 1 capsule (20 mg total) by mouth 2 (two) times daily before a meal. 60 capsule 6  . oxyCODONE-acetaminophen (PERCOCET) 10-325 MG tablet Take 1 tablet by mouth 5 (five) times daily as needed for pain.    [name] OZEMPIC, 1 MG/DOSE, 2 MG/1.5ML SOPN Inject 1  mg into the skin every Wednesday.     . predniSONE (DELTASONE) 10 MG tablet 4 X 2 DAYS, 3 X 2 DAYS, 2 X 2 DAYS, 1 X 2 DAYS 20 tablet 0  . pregabalin (LYRICA) 75 MG capsule Take 75 mg by mouth 2 (two) times daily.    . Vitamin D, Ergocalciferol, (DRISDOL) 1.25 MG (50000 UT) CAPS capsule TAKE 1 CAPSULE BY MOUTH 1 TIME A WEEK (Patient taking differently: Take 50,000 Units by mouth every Wednesday. ) 9 capsule 1  . VYVANSE 70 MG capsule Take 70 mg by mouth daily.    . [DISCONTINUED] mometasone-formoterol (DULERA) 200-5 MCG/ACT AERO Inhale 2 puffs into the lungs 2 (two) times daily. Dx: Asthma 8.8 g 3   No current facility-administered medications on file prior to visit.

## 2020-04-07 NOTE — Telephone Encounter (Signed)
Pt would like to know if she has to continue taking Eliquis

## 2020-04-07 NOTE — Telephone Encounter (Signed)
She needs to be on a due to her history of atrial fibrillation.

## 2020-04-11 NOTE — Telephone Encounter (Signed)
Late entry:  Pt was called and informed to continue taking the medication.

## 2020-04-14 ENCOUNTER — Other Ambulatory Visit: Payer: Self-pay

## 2020-04-14 ENCOUNTER — Ambulatory Visit (INDEPENDENT_AMBULATORY_CARE_PROVIDER_SITE_OTHER): Payer: Medicare Other

## 2020-04-14 DIAGNOSIS — J4551 Severe persistent asthma with (acute) exacerbation: Secondary | ICD-10-CM

## 2020-04-14 MED ORDER — DUPILUMAB 300 MG/2ML ~~LOC~~ SOSY
300.0000 mg | PREFILLED_SYRINGE | Freq: Once | SUBCUTANEOUS | Status: AC
  Administered 2020-04-14: 300 mg via SUBCUTANEOUS

## 2020-04-14 NOTE — Progress Notes (Signed)
Have you been hospitalized within the last 10 days?  No Do you have a fever?  No Do you have a cough?  No Do you have a headache or sore throat? No  

## 2020-05-02 ENCOUNTER — Ambulatory Visit: Payer: Medicare Other

## 2020-05-05 ENCOUNTER — Telehealth: Payer: Self-pay | Admitting: Internal Medicine

## 2020-05-05 NOTE — Telephone Encounter (Signed)
Spoke with Patient. Patient scheduled 05/11/20 at 1615 for Dupixent injection.

## 2020-05-10 ENCOUNTER — Ambulatory Visit (INDEPENDENT_AMBULATORY_CARE_PROVIDER_SITE_OTHER): Payer: Self-pay | Admitting: Family Medicine

## 2020-05-11 ENCOUNTER — Ambulatory Visit (INDEPENDENT_AMBULATORY_CARE_PROVIDER_SITE_OTHER): Payer: Medicare Other

## 2020-05-11 ENCOUNTER — Other Ambulatory Visit: Payer: Self-pay

## 2020-05-11 DIAGNOSIS — J4551 Severe persistent asthma with (acute) exacerbation: Secondary | ICD-10-CM | POA: Diagnosis not present

## 2020-05-11 MED ORDER — DUPILUMAB 300 MG/2ML ~~LOC~~ SOSY
300.0000 mg | PREFILLED_SYRINGE | Freq: Once | SUBCUTANEOUS | Status: AC
  Administered 2020-05-11: 300 mg via SUBCUTANEOUS

## 2020-05-11 NOTE — Progress Notes (Signed)
Have you been hospitalized within the last 10 days?  No Do you have a fever?  No Do you have a cough?  No Do you have a headache or sore throat? No  

## 2020-05-24 ENCOUNTER — Ambulatory Visit (INDEPENDENT_AMBULATORY_CARE_PROVIDER_SITE_OTHER): Payer: Self-pay | Admitting: Family Medicine

## 2020-05-26 ENCOUNTER — Ambulatory Visit: Payer: Medicare Other

## 2020-05-28 ENCOUNTER — Other Ambulatory Visit: Payer: Self-pay | Admitting: Family Medicine

## 2020-05-28 DIAGNOSIS — F32A Depression, unspecified: Secondary | ICD-10-CM

## 2020-05-28 DIAGNOSIS — F419 Anxiety disorder, unspecified: Secondary | ICD-10-CM

## 2020-05-28 NOTE — Telephone Encounter (Signed)
Requested Prescriptions  Pending Prescriptions Disp Refills  . hydrOXYzine (ATARAX/VISTARIL) 25 MG tablet [Pharmacy Med Name: HYDROXYZINE HCL 25MG  TABS (WHITE)] 90 tablet 1    Sig: TAKE 1 TABLET(25 MG) BY MOUTH THREE TIMES DAILY AS NEEDED     Ear, Nose, and Throat:  Antihistamines Passed - 05/28/2020  3:13 PM      Passed - Valid encounter within last 12 months    Recent Outpatient Visits          1 month ago Encounter for [employer] annual wellness exam   [place], [place], MD   3 months ago Hot flashes   [place], [place], MD   5 months ago Moderate persistent asthma with exacerbation   [place], [place], MD   9 months ago Type 2 diabetes mellitus without complication, without long-term current use of insulin ([place])   [facility] [place], Charlane Ferretti, MD   1 year ago Other hemorrhoids   [place], [place], MD      Future Appointments            In 1 week Young, Kasandra Knudsen, MD [facility]

## 2020-05-29 ENCOUNTER — Telehealth: Payer: Self-pay | Admitting: Internal Medicine

## 2020-05-29 MED ORDER — ALBUTEROL SULFATE HFA 108 (90 BASE) MCG/ACT IN AERS
INHALATION_SPRAY | RESPIRATORY_TRACT | 12 refills | Status: DC
Start: 2020-05-29 — End: 2020-05-30

## 2020-05-29 NOTE — Telephone Encounter (Signed)
Patient scheduled 05/30/20 at 4pm for Dupixent injection.

## 2020-05-29 NOTE — Telephone Encounter (Signed)
Spoke with Patient.  Patient rescheduled for Dupixent injection 05/30/20 at 4pm.  Patient requested proair prescription be sent to [facility]. Pro air prescription sent.  Nothing further at this time.

## 2020-05-30 ENCOUNTER — Ambulatory Visit (INDEPENDENT_AMBULATORY_CARE_PROVIDER_SITE_OTHER): Payer: Medicare Other

## 2020-05-30 ENCOUNTER — Other Ambulatory Visit: Payer: Self-pay

## 2020-05-30 ENCOUNTER — Other Ambulatory Visit: Payer: Self-pay | Admitting: Internal Medicine

## 2020-05-30 DIAGNOSIS — J4551 Severe persistent asthma with (acute) exacerbation: Secondary | ICD-10-CM | POA: Diagnosis not present

## 2020-05-30 MED ORDER — ALBUTEROL SULFATE HFA 108 (90 BASE) MCG/ACT IN AERS
INHALATION_SPRAY | RESPIRATORY_TRACT | 12 refills | Status: DC
Start: 2020-05-30 — End: 2021-04-05

## 2020-05-30 MED ORDER — DUPILUMAB 300 MG/2ML ~~LOC~~ SOSY
300.0000 mg | PREFILLED_SYRINGE | Freq: Once | SUBCUTANEOUS | Status: AC
  Administered 2020-05-30: 300 mg via SUBCUTANEOUS

## 2020-05-30 MED ORDER — EPINEPHRINE 0.3 MG/0.3ML IJ SOAJ: 0.3000 mg | INTRAMUSCULAR | 5 refills | Status: DC | PRN

## 2020-05-30 NOTE — Progress Notes (Signed)
Albuterol hfa refilled 

## 2020-05-30 NOTE — Progress Notes (Signed)
Have you been hospitalized within the last 10 days?  No Do you have a fever?  No Do you have a cough?  No Do you have a headache or sore throat? No Do you have your Epi Pen visible and is it within date?  Yes   Patient Epipen refilled for 2022. Prescription sent to requested [facility].

## 2020-06-01 ENCOUNTER — Encounter (INDEPENDENT_AMBULATORY_CARE_PROVIDER_SITE_OTHER): Payer: Self-pay | Admitting: Family Medicine

## 2020-06-01 ENCOUNTER — Encounter (INDEPENDENT_AMBULATORY_CARE_PROVIDER_SITE_OTHER): Payer: Self-pay

## 2020-06-01 ENCOUNTER — Ambulatory Visit (INDEPENDENT_AMBULATORY_CARE_PROVIDER_SITE_OTHER): Payer: Medicare Other | Admitting: Family Medicine

## 2020-06-01 ENCOUNTER — Other Ambulatory Visit: Payer: Self-pay

## 2020-06-01 VITALS — BP 139/72 | HR 90 | Temp 98.1°F | Ht 65.0 in | Wt 322.0 lb

## 2020-06-01 DIAGNOSIS — R5383 Other fatigue: Secondary | ICD-10-CM

## 2020-06-05 ENCOUNTER — Telehealth: Payer: Self-pay | Admitting: Internal Medicine

## 2020-06-05 NOTE — Telephone Encounter (Signed)
Dupixent Order: 300mg  #2 prefilled syringe Ordered Date: 06/05/20 Expected date of arrival: 06/08/20-pending consent Ordered by: Eason Housman,LPN Specialty Pharm[facility]: [facility]

## 2020-06-07 NOTE — Progress Notes (Deleted)
Patient ID: April [name], female    DOB: 01/02/1971, 50 y.o.   MRN: [id number]   HPI F former smoker followed for chronic severe asthma/ Xolair complicated by non-compliance,  OSA/ CPAP,  morbid obesity, HBP, depression, GERD, AFib/ Eliquis  NPSG 03/31/07- AHI 5.8/ hr, weight 330 lbs. She canceled planned more recent study. Office spirometry 10/24/14- severe obstructive airways disease. FVC 2.10/66%, FEV1 1.25/47%, FEV1/FVC 0.59, FEF 25-75 percent 0.64/19% Unattended HST 07/06/16- AHI 11/hour, desaturation to 74%. Office Spirometry 07/29/2016-moderately severe obstructive airways disease. FVC 2.21/68%, FEV1 1.53/57%, ratio 0.69, FEF 25-75% 0.98/34% Prolonged hosp [facility] 4/19- tracheostomy. Dupixent enrolled 10/22/2018 HST 12/26/2018- AHI 9.7/ hr, desaturation to 9.7/ hr, desaturation to 69% -------------------------------------------------------   03/31/20- 50 year old female former smoker followed for chronic severe asthma/Xolair, Tracheostomy at [facility] 2229,  complicated by noncompliance, OSA/ oral appliance, morbid obesity, HBP, depression, GERD Dupixent enrolled 10/22/2018-- every 2 weeks Ventolin hfa, Breo 200, Singulair Had failed CPAP- intol- has oral appliance ED- 03/14/20- Asthma exacerbation>>nebs, decadron, ------Moderate asthma and OSA- wheezing and prod cough with yellow sputum for the past couple of weeks. She states not sleeping well on ambien 5 mg. She is using her albuterol inhaler 3 x daily on average and neb about 2 x per day.  CXR 1V- 03/18/20-  Covid vax- none documented but she claims. Flu vax- had Caught a "cold" about 2 weeks ago. Nasal and chest congestion, cough, light yellow mucus, no fever. Slowly better.Wants to avoid ER.  Also says ambien not helping insomnia. Willing to retry clonazepam.   06/08/20-  50 year old female former smoker followed for chronic severe asthma/Xolair, Tracheostomy at [facility] 7989,  complicated by noncompliance, OSA/ oral appliance, morbid  obesity, HBP, depression, GERD Dupixent enrolled 10/22/2018-- every 2 weeks Ventolin hfa, Breo 200, Singulair Had failed CPAP- intol- has oral appliance Body weight today- Covid vax- Flu vax-   Review of Systems-See HPI  + = positive Constitutional:   + weight loss, night sweats, fevers, chills, +fatigue, lassitude. HEENT:   No-  headaches, difficulty swallowing, tooth/dental problems, sore throat,    sneezing, itching, ear ache, +nasal congestion, post nasal drip,  CV:  No-   chest pain, orthopnea, PND, swelling in lower extremities, anasarca, dizziness, palpitations Resp: +shortness of breath with exertion or at rest.              productive cough,  + non-productive cough,  No- coughing up of blood.              No-   change in color of mucus. wheezing.   Skin: No-   rash or lesions. GI:  No-   heartburn, indigestion, abdominal pain, nausea, vomiting,  GU: MS:  No-   joint pain or swelling.   Neuro-     nothing unusual Psych:  No- change in mood or affect. No depression or anxiety.  No memory loss.    Objective:   Physical Exam    General- Alert, Oriented, Affect-appropriate, Distress- none acute, + morbid  obesity,  Skin- rash-none, lesions- none, excoriation- none Lymphadenopathy- none Head- atraumatic            Eyes- Gross vision intact, PERRLA, conjunctivae clear secretions            Ears- Hearing, canals-normal            Nose- Clear, no-Septal dev, mucus, polyps, erosion, perforation. + sniffing            Throat- Mallampati III , mucosa clear ,  drainage- none, tonsils- atrophic,                           + Missing  teeth, +upper denture plate is out                  Neck- flexible , trachea+ stoma scar, no stridor , thyroid nl, carotid no bruit Chest - symmetrical excursion , unlabored           Heart/CV- RRR , no murmur , no gallop  , no rub, nl s1 s2                           - JVD- none , edema- none, stasis changes- none, varices- none           Lung-  +Diminished/unlabored.  Wheeze + bases, cough+,                                                dullness-none,  rub- none           Chest wall-  Abd-  Br/ Gen/ Rectal- Not done, not indicated Extrem- cyanosis- none, clubbing, none, atrophy- none, strength- nl Neuro- grossly intact to observation

## 2020-06-08 ENCOUNTER — Ambulatory Visit: Payer: Medicare Other | Admitting: Internal Medicine

## 2020-06-12 ENCOUNTER — Other Ambulatory Visit: Payer: Self-pay | Admitting: Internal Medicine

## 2020-06-13 ENCOUNTER — Telehealth: Payer: Self-pay | Admitting: Pharmacist

## 2020-06-13 NOTE — Telephone Encounter (Signed)
Ambien refilled

## 2020-06-13 NOTE — Telephone Encounter (Signed)
Faxed appeal letter for Dupixent to [employer] for expedited review.  Fax: [phone] Phone: [phone]  [name], PharmD, MPH Clinical Pharmacist (Rheumatology and Pulmonology)

## 2020-06-14 ENCOUNTER — Ambulatory Visit: Payer: Medicare Other

## 2020-06-15 ENCOUNTER — Ambulatory Visit (INDEPENDENT_AMBULATORY_CARE_PROVIDER_SITE_OTHER): Payer: Self-pay | Admitting: Family Medicine

## 2020-06-15 ENCOUNTER — Telehealth: Payer: Self-pay | Admitting: Internal Medicine

## 2020-06-15 ENCOUNTER — Ambulatory Visit: Payer: Medicare Other | Admitting: Internal Medicine

## 2020-06-15 NOTE — Telephone Encounter (Signed)
Insurance PA is in appeal process. See other encounter.

## 2020-06-15 NOTE — Telephone Encounter (Signed)
Turned call to pharmacy. Advised that patient's formulary changed for 2022 and Dupixent is not preferred. Office submitted appeal on 06/13/20. Will advise once we receive response.

## 2020-06-15 NOTE — Telephone Encounter (Signed)
Received notification from [facility] regarding a prior authorization for [place]. Authorization has been APPROVED from 06/12/20 to 04/28/21.   Authorization # [id number] Phone # [phone]

## 2020-06-19 ENCOUNTER — Telehealth: Payer: Self-pay | Admitting: Internal Medicine

## 2020-06-19 NOTE — Telephone Encounter (Signed)
Prior auth denial overturned. Dupixent appeal approved.  Routing to [facility] injection team as FYI since patient receives Dupixent in clinic.

## 2020-06-19 NOTE — Progress Notes (Signed)
Patient wanted to see a Bariatric Surgeon. Stopped visit and refunded initial fee. Briscoe Deutscher, DO

## 2020-06-19 NOTE — Telephone Encounter (Signed)
Noted  

## 2020-06-19 NOTE — Telephone Encounter (Signed)
Dupixent Order: 300mg  #2 prefilled syringe Ordered Date: 06/19/20 Expected date of arrival: Consent needed Ordered by: [place]: [employer]  [employer] tried calling Patient x's 2 to get consent to [place]. April, [employer] representative stated Patient answered and hung up. ATC patient.  Left detailed message, with [employer] contact information.

## 2020-06-21 NOTE — Telephone Encounter (Signed)
Patient scheduled Dupixent injection 06/23/20 at 4pm.

## 2020-06-23 ENCOUNTER — Ambulatory Visit (INDEPENDENT_AMBULATORY_CARE_PROVIDER_SITE_OTHER): Payer: Medicare Other

## 2020-06-23 ENCOUNTER — Other Ambulatory Visit: Payer: Self-pay

## 2020-06-23 DIAGNOSIS — J4550 Severe persistent asthma, uncomplicated: Secondary | ICD-10-CM

## 2020-06-23 DIAGNOSIS — J4551 Severe persistent asthma with (acute) exacerbation: Secondary | ICD-10-CM

## 2020-06-23 MED ORDER — DUPILUMAB 300 MG/2ML ~~LOC~~ SOSY
300.0000 mg | PREFILLED_SYRINGE | Freq: Once | SUBCUTANEOUS | Status: AC
  Administered 2020-06-23: 300 mg via SUBCUTANEOUS

## 2020-06-23 NOTE — Progress Notes (Signed)
Have you been hospitalized within the last 10 days?  No Do you have a fever?  No Do you have a cough?  No Do you have a headache or sore throat? No  

## 2020-06-29 ENCOUNTER — Telehealth: Payer: Self-pay | Admitting: Internal Medicine

## 2020-06-29 NOTE — Telephone Encounter (Signed)
Dupixent Shipment Received: 300mg  #2 prefilled syringe Medication arrival date: 06/29/20 Lot #: [id number] Exp date: 11/27/2022 Received by: Elliot Dally

## 2020-07-04 ENCOUNTER — Other Ambulatory Visit (HOSPITAL_COMMUNITY): Payer: Self-pay | Admitting: Pharmacy Technician

## 2020-07-04 DIAGNOSIS — J4551 Severe persistent asthma with (acute) exacerbation: Secondary | ICD-10-CM | POA: Insufficient documentation

## 2020-07-04 DIAGNOSIS — J4550 Severe persistent asthma, uncomplicated: Secondary | ICD-10-CM | POA: Insufficient documentation

## 2020-07-10 ENCOUNTER — Ambulatory Visit (INDEPENDENT_AMBULATORY_CARE_PROVIDER_SITE_OTHER): Payer: Medicare Other | Admitting: *Deleted

## 2020-07-10 ENCOUNTER — Other Ambulatory Visit: Payer: Self-pay

## 2020-07-10 VITALS — BP 171/106 | HR 81 | Temp 98.3°F | Resp 18

## 2020-07-10 DIAGNOSIS — J4550 Severe persistent asthma, uncomplicated: Secondary | ICD-10-CM

## 2020-07-10 MED ORDER — DIPHENHYDRAMINE HCL 50 MG/ML IJ SOLN: 50.0000 mg | Freq: Once | INTRAMUSCULAR | Status: DC | PRN

## 2020-07-10 MED ORDER — EPINEPHRINE 0.3 MG/0.3ML IJ SOAJ: 0.3000 mg | Freq: Once | INTRAMUSCULAR | Status: DC | PRN

## 2020-07-10 MED ORDER — DUPILUMAB 300 MG/2ML ~~LOC~~ SOSY
300.0000 mg | PREFILLED_SYRINGE | Freq: Once | SUBCUTANEOUS | Status: AC
  Administered 2020-07-10: 300 mg via SUBCUTANEOUS
  Filled 2020-07-10: qty 2

## 2020-07-10 MED ORDER — FAMOTIDINE IN NACL 20-0.9 MG/50ML-% IV SOLN: 20.0000 mg | Freq: Once | INTRAVENOUS | Status: DC | PRN

## 2020-07-10 MED ORDER — METHYLPREDNISOLONE SODIUM SUCC 125 MG IJ SOLR: 125.0000 mg | Freq: Once | INTRAMUSCULAR | Status: DC | PRN

## 2020-07-10 MED ORDER — SODIUM CHLORIDE 0.9 % IV SOLN: Freq: Once | INTRAVENOUS | Status: DC | PRN

## 2020-07-10 MED ORDER — ALBUTEROL SULFATE HFA 108 (90 BASE) MCG/ACT IN AERS: 2.0000 | INHALATION_SPRAY | Freq: Once | RESPIRATORY_TRACT | Status: DC | PRN

## 2020-07-12 NOTE — Progress Notes (Addendum)
Diagnosis: Asthma  Provider:  Praveen Mannam, MD  Procedure: Injection  Dupixent (Dupilumab), Dose: 300 mg, Site: subcutaneous  Discharge: Condition: Good, Destination: Home . AVS provided to patient.   Performed by:  Daylene Vandenbosch, RN       

## 2020-07-18 ENCOUNTER — Other Ambulatory Visit: Payer: Self-pay | Admitting: Internal Medicine

## 2020-07-18 ENCOUNTER — Other Ambulatory Visit: Payer: Self-pay | Admitting: Pharmacist

## 2020-07-18 MED ORDER — DUPIXENT 300 MG/2ML ~~LOC~~ SOSY: 300.0000 mg | PREFILLED_SYRINGE | SUBCUTANEOUS | 5 refills | Status: DC

## 2020-07-18 MED FILL — DUPIXENT 300 MG/2 ML SAFE S: 300 | 28 days supply | Qty: 4 | Fill #0

## 2020-07-18 NOTE — Telephone Encounter (Signed)
Prescription sent to [facility] for Dupixent PFS. Patient transitioned to [place] at [facility] for further doses.  Patient needs f/u with provider. Will route to scheduling team as well

## 2020-07-20 NOTE — Telephone Encounter (Signed)
Called and spoke with pt and have scheduled her a f/u with CY. Stated to pt to make sure she keeps appt due to recent appts either being cancelled by her or she has no showed. Pt verbalized understanding. Nothing further needed.

## 2020-07-21 ENCOUNTER — Other Ambulatory Visit (HOSPITAL_COMMUNITY): Payer: Self-pay

## 2020-07-24 ENCOUNTER — Ambulatory Visit: Payer: Medicare Other

## 2020-07-26 ENCOUNTER — Other Ambulatory Visit: Payer: Self-pay | Admitting: Family Medicine

## 2020-07-26 ENCOUNTER — Ambulatory Visit (INDEPENDENT_AMBULATORY_CARE_PROVIDER_SITE_OTHER): Payer: Medicare Other

## 2020-07-26 ENCOUNTER — Other Ambulatory Visit: Payer: Self-pay

## 2020-07-26 VITALS — BP 141/83 | HR 78 | Temp 98.8°F | Resp 20

## 2020-07-26 DIAGNOSIS — J4550 Severe persistent asthma, uncomplicated: Secondary | ICD-10-CM | POA: Diagnosis not present

## 2020-07-26 DIAGNOSIS — F32A Depression, unspecified: Secondary | ICD-10-CM

## 2020-07-26 DIAGNOSIS — F419 Anxiety disorder, unspecified: Secondary | ICD-10-CM

## 2020-07-26 MED ORDER — DIPHENHYDRAMINE HCL 50 MG/ML IJ SOLN: 50.0000 mg | Freq: Once | INTRAMUSCULAR | Status: DC | PRN

## 2020-07-26 MED ORDER — EPINEPHRINE 0.3 MG/0.3ML IJ SOAJ: 0.3000 mg | Freq: Once | INTRAMUSCULAR | Status: DC | PRN

## 2020-07-26 MED ORDER — SODIUM CHLORIDE 0.9 % IV SOLN: Freq: Once | INTRAVENOUS | Status: DC | PRN

## 2020-07-26 MED ORDER — FAMOTIDINE IN NACL 20-0.9 MG/50ML-% IV SOLN: 20.0000 mg | Freq: Once | INTRAVENOUS | Status: DC | PRN

## 2020-07-26 MED ORDER — METHYLPREDNISOLONE SODIUM SUCC 125 MG IJ SOLR: 125.0000 mg | Freq: Once | INTRAMUSCULAR | Status: DC | PRN

## 2020-07-26 MED ORDER — DUPILUMAB 300 MG/2ML ~~LOC~~ SOSY
300.0000 mg | PREFILLED_SYRINGE | Freq: Once | SUBCUTANEOUS | Status: AC
  Administered 2020-07-26: 300 mg via SUBCUTANEOUS
  Filled 2020-07-26: qty 2

## 2020-07-26 MED ORDER — ALBUTEROL SULFATE HFA 108 (90 BASE) MCG/ACT IN AERS: 2.0000 | INHALATION_SPRAY | Freq: Once | RESPIRATORY_TRACT | Status: DC | PRN

## 2020-07-26 NOTE — Progress Notes (Signed)
Diagnosis: Asthma  Provider: Dr. Marshell Garfinkel   Procedure: Injection, Medication: Dupixent 300mg , Site: subcutaneous left abdomen by Koren Shiver, RN  Discharge: Condition: Good, Destination: Home . AVS provided. by Koren Shiver, RN    Pt tolerated well. Discussed possibility of self administration of injections. Pt is fearful that she may not remember and wants to give it more thought.

## 2020-07-31 NOTE — Progress Notes (Signed)
Diagnosis: Asthma  Provider:  Marshell Garfinkel, MD  Procedure: Injection  Dupixent (Dupilumab), Dose: 300 mg, Site: subcutaneous  Discharge: Condition: Good, Destination: Home . AVS provided to patient.   Performed by:  Jonelle Sidle, RN

## 2020-08-02 ENCOUNTER — Ambulatory Visit (HOSPITAL_COMMUNITY)
Admission: EM | Admit: 2020-08-02 | Discharge: 2020-08-02 | Disposition: A | Discharge status: Home / Self Care | Payer: Medicare Other
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
## 2020-08-07 ENCOUNTER — Ambulatory Visit (INDEPENDENT_AMBULATORY_CARE_PROVIDER_SITE_OTHER): Payer: Medicare Other

## 2020-08-07 ENCOUNTER — Other Ambulatory Visit: Payer: Self-pay

## 2020-08-07 VITALS — BP 143/93 | HR 72 | Temp 98.9°F | Resp 18

## 2020-08-07 DIAGNOSIS — J4550 Severe persistent asthma, uncomplicated: Secondary | ICD-10-CM

## 2020-08-07 MED ORDER — SODIUM CHLORIDE 0.9 % IV SOLN: Freq: Once | INTRAVENOUS | Status: DC | PRN

## 2020-08-07 MED ORDER — DIPHENHYDRAMINE HCL 50 MG/ML IJ SOLN: 50.0000 mg | Freq: Once | INTRAMUSCULAR | Status: DC | PRN

## 2020-08-07 MED ORDER — DUPILUMAB 300 MG/2ML ~~LOC~~ SOSY
300.0000 mg | PREFILLED_SYRINGE | Freq: Once | SUBCUTANEOUS | Status: AC
  Administered 2020-08-07: 300 mg via SUBCUTANEOUS
  Filled 2020-08-07: qty 2

## 2020-08-07 MED ORDER — FAMOTIDINE IN NACL 20-0.9 MG/50ML-% IV SOLN: 20.0000 mg | Freq: Once | INTRAVENOUS | Status: DC | PRN

## 2020-08-07 MED ORDER — METHYLPREDNISOLONE SODIUM SUCC 125 MG IJ SOLR: 125.0000 mg | Freq: Once | INTRAMUSCULAR | Status: DC | PRN

## 2020-08-07 MED ORDER — EPINEPHRINE 0.3 MG/0.3ML IJ SOAJ: 0.3000 mg | Freq: Once | INTRAMUSCULAR | Status: DC | PRN

## 2020-08-07 MED ORDER — ALBUTEROL SULFATE HFA 108 (90 BASE) MCG/ACT IN AERS: 2.0000 | INHALATION_SPRAY | Freq: Once | RESPIRATORY_TRACT | Status: DC | PRN

## 2020-08-07 NOTE — Progress Notes (Signed)
Diagnosis: Asthma  Provider:  Marshell Garfinkel, MD  Procedure: Injection  Dupixent (Dupilumab), Dose: 300 mg, Site: subcutaneous  Discharge: Condition: Good, Destination: Home . AVS provided to patient.   Performed by:  Jonelle Sidle, RN

## 2020-08-09 ENCOUNTER — Telehealth: Payer: Self-pay | Admitting: Internal Medicine

## 2020-08-09 NOTE — Telephone Encounter (Signed)
Pt  currently uses [facility]. Will call [facility] to cancel script.

## 2020-08-11 ENCOUNTER — Telehealth: Payer: Self-pay | Admitting: Adult Health

## 2020-08-11 MED ORDER — PREDNISONE 10 MG PO TABS: ORAL_TABLET | ORAL | 0 refills | Status: DC

## 2020-08-11 NOTE — Telephone Encounter (Signed)
Complains on 1 day of cough/wheezing and shortness of breath Used albuterol inhaler with some help  Remains on TRELEGY .  No fever, loss of taste or smell  + covid vaccine x 3   Wants rx prednisone taper .   Advised can use Nebulizer As needed  Keep follow up with Dr. Annamaria Boots  As planned this month   If not improving or worsens will need to go to ER   Please contact office for sooner follow up if symptoms do not improve or worsen or seek emergency care   Will send to Dr. Annamaria Boots  For FY

## 2020-08-12 ENCOUNTER — Emergency Department (HOSPITAL_COMMUNITY): Payer: Medicare Other

## 2020-08-12 ENCOUNTER — Emergency Department (HOSPITAL_COMMUNITY)
Admission: EM | Admit: 2020-08-12 | Discharge: 2020-08-12 | Discharge status: Against Medical Advice | Payer: Medicare Other | Attending: Emergency Medicine | Admitting: Emergency Medicine

## 2020-08-12 ENCOUNTER — Encounter (HOSPITAL_COMMUNITY): Payer: Self-pay

## 2020-08-12 DIAGNOSIS — I110 Hypertensive heart disease with heart failure: Secondary | ICD-10-CM | POA: Insufficient documentation

## 2020-08-12 DIAGNOSIS — Z5329 Procedure and treatment not carried out because of patient's decision for other reasons: Secondary | ICD-10-CM

## 2020-08-12 DIAGNOSIS — J449 Chronic obstructive pulmonary disease, unspecified: Secondary | ICD-10-CM | POA: Diagnosis not present

## 2020-08-12 DIAGNOSIS — Z87891 Personal history of nicotine dependence: Secondary | ICD-10-CM | POA: Insufficient documentation

## 2020-08-12 DIAGNOSIS — I5032 Chronic diastolic (congestive) heart failure: Secondary | ICD-10-CM | POA: Diagnosis not present

## 2020-08-12 DIAGNOSIS — J4541 Moderate persistent asthma with (acute) exacerbation: Secondary | ICD-10-CM | POA: Diagnosis not present

## 2020-08-12 DIAGNOSIS — Z7951 Long term (current) use of inhaled steroids: Secondary | ICD-10-CM | POA: Diagnosis not present

## 2020-08-12 DIAGNOSIS — Z20822 Contact with and (suspected) exposure to covid-19: Secondary | ICD-10-CM | POA: Insufficient documentation

## 2020-08-12 DIAGNOSIS — J45901 Unspecified asthma with (acute) exacerbation: Secondary | ICD-10-CM

## 2020-08-12 DIAGNOSIS — R0602 Shortness of breath: Secondary | ICD-10-CM | POA: Diagnosis present

## 2020-08-12 LAB — CBC WITH DIFFERENTIAL/PLATELET
Abs Immature Granulocytes: 0.06 10*3/uL (ref 0.00–0.07)
Basophils Absolute: 0 10*3/uL (ref 0.0–0.1)
Basophils Relative: 0 %
Eosinophils Absolute: 0 10*3/uL (ref 0.0–0.5)
Eosinophils Relative: 0 %
HCT: 39.3 % (ref 36.0–46.0)
Hemoglobin: 12.4 g/dL (ref 12.0–15.0)
Immature Granulocytes: 1 %
Lymphocytes Relative: 11 %
Lymphs Abs: 1.4 10*3/uL (ref 0.7–4.0)
MCH: 26.1 pg (ref 26.0–34.0)
MCHC: 31.6 g/dL (ref 30.0–36.0)
MCV: 82.6 fL (ref 80.0–100.0)
Monocytes Absolute: 0.4 10*3/uL (ref 0.1–1.0)
Monocytes Relative: 3 %
Neutro Abs: 11.3 10*3/uL — ABNORMAL HIGH (ref 1.7–7.7)
Neutrophils Relative %: 85 %
Platelets: 313 10*3/uL (ref 150–400)
RBC: 4.76 MIL/uL (ref 3.87–5.11)
RDW: 17 % — ABNORMAL HIGH (ref 11.5–15.5)
WBC: 13.2 10*3/uL — ABNORMAL HIGH (ref 4.0–10.5)
nRBC: 0 % (ref 0.0–0.2)

## 2020-08-12 LAB — RESP PANEL BY RT-PCR (FLU A&B, COVID) ARPGX2
Influenza A by PCR: NEGATIVE
Influenza B by PCR: NEGATIVE
SARS Coronavirus 2 by RT PCR: NEGATIVE

## 2020-08-12 LAB — BASIC METABOLIC PANEL
Anion gap: 9 (ref 5–15)
BUN: 7 mg/dL (ref 6–20)
CO2: 21 mmol/L — ABNORMAL LOW (ref 22–32)
Calcium: 8.9 mg/dL (ref 8.9–10.3)
Chloride: 106 mmol/L (ref 98–111)
Creatinine, Ser: 0.79 mg/dL (ref 0.44–1.00)
GFR, Estimated: >60 mL/min (ref >60)
Glucose, Bld: 217 mg/dL — ABNORMAL HIGH (ref 70–99)
Potassium: 3.3 mmol/L — ABNORMAL LOW (ref 3.5–5.1)
Sodium: 136 mmol/L (ref 135–145)

## 2020-08-12 LAB — I-STAT BETA HCG BLOOD, ED (MC, WL, AP ONLY): I-stat hCG, quantitative: <5 m[IU]/mL (ref <5)

## 2020-08-12 LAB — BRAIN NATRIURETIC PEPTIDE: B Natriuretic Peptide: 115.3 pg/mL — ABNORMAL HIGH (ref 0.0–100.0)

## 2020-08-12 MED ORDER — IPRATROPIUM-ALBUTEROL 0.5-2.5 (3) MG/3ML IN SOLN
3.0000 mL | Freq: Once | RESPIRATORY_TRACT | Status: AC
  Administered 2020-08-12: 3 mL via RESPIRATORY_TRACT
  Filled 2020-08-12: qty 3

## 2020-08-12 MED ORDER — METHYLPREDNISOLONE SODIUM SUCC 125 MG IJ SOLR
125.0000 mg | Freq: Once | INTRAMUSCULAR | Status: AC
  Administered 2020-08-12: 125 mg via INTRAVENOUS
  Filled 2020-08-12: qty 2

## 2020-08-12 MED ORDER — ALBUTEROL SULFATE HFA 108 (90 BASE) MCG/ACT IN AERS
6.0000 | INHALATION_SPRAY | Freq: Once | RESPIRATORY_TRACT | Status: AC
  Administered 2020-08-12: 6 via RESPIRATORY_TRACT
  Filled 2020-08-12: qty 6.7

## 2020-08-12 MED ORDER — PREDNISONE 20 MG PO TABS: 40.0000 mg | ORAL_TABLET | Freq: Every day | ORAL | 0 refills | Status: DC

## 2020-08-12 MED ORDER — ALBUTEROL (5 MG/ML) CONTINUOUS INHALATION SOLN
7.5000 mg/h | INHALATION_SOLUTION | Freq: Once | RESPIRATORY_TRACT | Status: AC
  Administered 2020-08-12: 7.5 mg/h via RESPIRATORY_TRACT
  Filled 2020-08-12: qty 20

## 2020-08-12 MED ORDER — MAGNESIUM SULFATE 2 GM/50ML IV SOLN
2.0000 g | Freq: Once | INTRAVENOUS | Status: AC
  Administered 2020-08-12: 2 g via INTRAVENOUS
  Filled 2020-08-12: qty 50

## 2020-08-12 MED ORDER — POTASSIUM CHLORIDE CRYS ER 20 MEQ PO TBCR: 30.0000 meq | EXTENDED_RELEASE_TABLET | Freq: Once | ORAL | Status: DC
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
## 2020-08-12 NOTE — ED Notes (Signed)
RT at bedside.

## 2020-08-12 NOTE — ED Notes (Signed)
Sitting up in bed watching TV

## 2020-08-12 NOTE — ED Triage Notes (Signed)
SOB started last night. Hx asthma and CHF albuterol inhalers did not provide relief. Fire gave her 5mg  albuterol and she felt worse. Rales heard all lung fields per medic, placed on 10LNRB  And patient felt better. BP 140/90, HR 90 with frequent PVC's noted on 12 lead.

## 2020-08-12 NOTE — Discharge Instructions (Addendum)
Today we discussed that the medical recommendation is for admission for continued treatment of your shortness of breath.  We discussed the reason why this was recommended, along with the anticipated hospital course and possible consequences of choosing [facility].  Today you were able to state your understanding, tell me the risks that we had discussed, understanding that those are not all inclusive, and stated that there was nothing that I could do or say to convince you to stay.  Please do not hesitate to return to the emergency room. Please follow-up with your pulmonologist. I have given you a extended prednisone taper.  Please do not take this and the one you were given by pulmonology yesterday at the same time as this would be too much steroids.

## 2020-08-12 NOTE — ED Notes (Signed)
ED Provider at bedside. 

## 2020-08-12 NOTE — ED Notes (Signed)
Unable to obtain IV access x 2 attempts. Will order IV team for placement and blood draw.

## 2020-08-12 NOTE — Consult Note (Signed)
Hospitalist Service Medical Consultation   April [name]  EHM:[id number]  DOB: July 22, 1970  DOA: 08/12/2020  PCP: [name], MD   Outpatient Specialists: [facility]   Requesting physician: [facility]   Reason for consultation: Admission for ongoing asthma flare  History of Present Illness: April [name] is an 50 y.o. female with PMH significant for significant asthma requiring intubation and tracheostomy 2 years ago, OSA with CPAP use, obesity, and HFrEF who presents for ongoing dyspnea on exertion.  She spoke with her pulmonologist yesterday who started her on steroids and inhaled bronchodilators however she notes that she continues to feel worse today.  In the ED patient was tested for Covid which was found to be negative, chest x-ray was negative.  Initial MDI inhalers were not effective to relieve bronchospasm however she received 1 hour of albuterol with ipratropium was improved.  She was also given steroids.  Patient was called in for admission for ongoing treatment of asthma  Patient denied fevers or chills.  No new productive cough.  No change in baseline orthopnea.  When I went to see the patient, she said she did not want to be admitted.  She feels that she could take care of herself at home.  Notes that she feels better.  Notes that she has a nebulizer at home and she can take steroids at home as well.  Notes that last time she was here she developed pneumonia and is afraid that she will get pneumonia again if she comes into the hospital.  We discussed at length the fact that she was still clearly short of breath with tachypnea and that she was unable to speak in full sentences without taking a breath.  I strongly urged her to let us admit her and observe her and give her NIPV if necessary.  We discussed this at length however patient declined admission.  I again strongly urged her to be admitted and patient said that she could come back if she got  worse.    Review of Systems:  As per HPI otherwise 10 point review of systems negative.   Past Medical History: Past Medical History:  Diagnosis Date  . Acanthosis nigricans   . Anxiety   . Arthritis    "knees" (04/28/2017)  . Asthma    Followed by Dr. Annamaria Boots (pulmonology); receives every other week omalizumab injections; has frequent exacerbations  . Back pain   . Chronic diastolic CHF (congestive heart failure) ([place]) 01/17/2017  . COPD (chronic obstructive pulmonary disease) ([place])    PFTs in 2002, FEV1/FVC 65, no post bronchodilater test done  . Depression   . GERD (gastroesophageal reflux disease)   . Headache(784.0)    "q couple days" (04/28/2017)  . Helicobacter pylori (H. pylori) infection   . Hypertension, essential   . Insomnia   . Joint pain   . Lower extremity edema   . Menorrhagia   . Morbid obesity ([place])   . OSA on CPAP    Sleep study 2008 - mild OSA, not enough events to titrate CPAP; wears CPAP now/pt on 04/28/2017  . Pneumonia X 1  . Prediabetes   . Rheumatoid arthritis ([place])   . Seasonal allergies   . Shortness of breath   . Tobacco user   . Vitamin D deficiency     Past Surgical History: Past Surgical History:  Procedure Laterality Date  . CARDIOVERSION N/A 05/30/2017  Procedure: CARDIOVERSION;  Surgeon: Sanda Klein, MD;  Location: [facility];  Service: Cardiovascular;  Laterality: N/A;  . REDUCTION MAMMAPLASTY Bilateral 09/2011  . TUBAL LIGATION  1996   bilateral     Allergies:   Allergies  Allergen Reactions  . Contrast Media [Iodinated Diagnostic Agents] Itching    Ct contrast     Social History:  reports that she quit smoking about 5 years ago. Her smoking use included cigarettes. She has a 13.00 pack-year smoking history. She has never used smokeless tobacco. She reports that she does not drink alcohol and does not use drugs.   Family History: Family History  Problem Relation Age of Onset  . Hypertension Mother   . Asthma  Daughter   . Cancer Paternal Aunt   . Asthma Maternal Grandmother      Physical Exam: Vitals:   08/12/20 1535 08/12/20 1615 08/12/20 1700 08/12/20 1800  BP:  (!) 162/85 (!) 148/76 (!) 158/64  Pulse:  (!) 118 (!) 125 (!) 105  Resp:  (!) 23 (!) 25 (!) 25  Temp:    98.5 F (36.9 C)  TempSrc:    Oral  SpO2: 100% 100% 100% 97%    Constitutional: Obese patient sitting up in bed with mild to moderate labored respirations with tachypnea.  Patient needed to take a breath in the middle of long sentences. Eyes: sclera anicteric, conjuctiva clear, no lid lag, no exophthalmos, EOMI CVS: S1-S2 clear, no murmur rubs or gallops, no LE edema, normal pedal pulses  Respiratory: Patient sounds tight although she has some air entry.  Rare fine high-pitched wheezes bilaterally.  No rales or rhonchi. GI: NABS, soft, NT, ND, no palpable masses.  LE: No edema. No cyanosis Neuro: A/O x 3, Moving all extremities equally with normal strength, CN 3-12 intact, grossly nonfocal.  Psych: patient is logical and coherent.  She was able to state the risks of going home [place] including possible respiratory failure.  However she feels that she can come back to the hospital if she got worse. Skin: no rashes or lesions or ulcers,    Data reviewed:  I have personally reviewed following labs and imaging studies Labs:  CBC: Recent Labs  Lab 08/12/20 1351  WBC 13.2*  NEUTROABS 11.3*  HGB 12.4  HCT 39.3  MCV 82.6  PLT 144    Basic Metabolic Panel: Recent Labs  Lab 08/12/20 1608  NA 136  K 3.3*  CL 106  CO2 21*  GLUCOSE 217*  BUN 7  CREATININE 0.79  CALCIUM 8.9   GFR CrCl cannot be calculated (Unknown ideal weight.). Liver Function Tests: No results for input(s): AST, ALT, ALKPHOS, BILITOT, PROT, ALBUMIN in the last 168 hours. No results for input(s): LIPASE, AMYLASE in the last 168 hours. No results for input(s): AMMONIA in the last 168 hours. Coagulation profile No results for  input(s): INR, PROTIME in the last 168 hours.  Cardiac Enzymes: No results for input(s): CKTOTAL, CKMB, CKMBINDEX, TROPONINI in the last 168 hours. BNP: Invalid input(s): POCBNP CBG: No results for input(s): GLUCAP in the last 168 hours. D-Dimer No results for input(s): DDIMER in the last 72 hours. Hgb A1c No results for input(s): HGBA1C in the last 72 hours. Lipid Profile No results for input(s): CHOL, HDL, LDLCALC, TRIG, CHOLHDL, LDLDIRECT in the last 72 hours. Thyroid function studies No results for input(s): TSH, T4TOTAL, T3FREE, THYROIDAB in the last 72 hours.  Invalid input(s): FREET3 Anemia work up No results for input(s): VITAMINB12, FOLATE,  FERRITIN, TIBC, IRON, RETICCTPCT in the last 72 hours. Urinalysis    Component Value Date/Time   COLORURINE YELLOW 03/18/2020 1952   APPEARANCEUR HAZY (A) 03/18/2020 1952   LABSPEC 1.032 (H) 03/18/2020 1952   PHURINE 5.0 03/18/2020 1952   GLUCOSEU NEGATIVE 03/18/2020 1952   GLUCOSEU NEG mg/dL 10/28/2007 2049   HGBUR NEGATIVE 03/18/2020 1952   BILIRUBINUR NEGATIVE 03/18/2020 1952   KETONESUR 5 (A) 03/18/2020 1952   PROTEINUR 30 (A) 03/18/2020 1952   UROBILINOGEN 1.0 11/21/2014 0707   NITRITE NEGATIVE 03/18/2020 1952   LEUKOCYTESUR NEGATIVE 03/18/2020 1952     Sepsis Labs Invalid input(s): PROCALCITONIN,  WBC,  LACTICIDVEN Microbiology Recent Results (from the past 240 hour(s))  Resp Panel by RT-PCR (Flu A&B, Covid) Nasopharyngeal Swab     Status: None   Collection Time: 08/12/20 12:01 PM   Specimen: Nasopharyngeal Swab; Nasopharyngeal(NP) swabs in vial transport medium  Result Value Ref Range Status   SARS Coronavirus 2 by RT PCR NEGATIVE NEGATIVE Final    Comment: (NOTE) SARS-CoV-2 target nucleic acids are NOT DETECTED.  The SARS-CoV-2 RNA is generally detectable in upper respiratory specimens during the acute phase of infection. The lowest concentration of SARS-CoV-2 viral copies this assay can detect is 138  copies/mL. A negative result does not preclude SARS-Cov-2 infection and should not be used as the sole basis for treatment or other patient management decisions. A negative result may occur with  improper specimen collection/handling, submission of specimen other than nasopharyngeal swab, presence of viral mutation(s) within the areas targeted by this assay, and inadequate number of viral copies(<138 copies/mL). A negative result must be combined with clinical observations, patient history, and epidemiological information. The expected result is Negative.  Fact Sheet for Patients:  EntrepreneurPulse.com.au  Fact Sheet for Healthcare Providers:  IncredibleEmployment.be  This test is no t yet approved or cleared by the [place] FDA and  has been authorized for detection and/or diagnosis of SARS-CoV-2 by FDA under an Emergency Use Authorization (EUA). This EUA will remain  in effect (meaning this test can be used) for the duration of the COVID-19 declaration under Section 564(b)(1) of the Act, 21 U.S.C.section 360bbb-3(b)(1), unless the authorization is terminated  or revoked sooner.       Influenza A by PCR NEGATIVE NEGATIVE Final   Influenza B by PCR NEGATIVE NEGATIVE Final    Comment: (NOTE) The Xpert Xpress SARS-CoV-2/FLU/RSV plus assay is intended as an aid in the diagnosis of influenza from Nasopharyngeal swab specimens and should not be used as a sole basis for treatment. Nasal washings and aspirates are unacceptable for Xpert Xpress SARS-CoV-2/FLU/RSV testing.  Fact Sheet for Patients: EntrepreneurPulse.com.au  Fact Sheet for Healthcare Providers: IncredibleEmployment.be  This test is not yet approved or cleared by the [place] FDA and has been authorized for detection and/or diagnosis of SARS-CoV-2 by FDA under an Emergency Use Authorization (EUA). This EUA will remain in effect (meaning  this test can be used) for the duration of the COVID-19 declaration under Section 564(b)(1) of the Act, 21 U.S.C. section 360bbb-3(b)(1), unless the authorization is terminated or revoked.  Performed at [facility], [place] [address] [place], [place] [address]        Inpatient Medications:   Scheduled Meds: . potassium chloride  30 mEq Oral Once   Continuous Infusions:   Radiological Exams on Admission: DG Chest Portable 1 View  Result Date: 08/12/2020 CLINICAL DATA:  Shortness of breath, wheezing. EXAM: PORTABLE CHEST 1 VIEW COMPARISON:  Chest  x-ray dated 03/18/2020. FINDINGS: Heart size and mediastinal contours are within normal limits. Lungs are clear. No pleural effusion or pneumothorax is seen. Osseous structures about the chest are unremarkable. IMPRESSION: No active disease. No evidence of pneumonia or pulmonary edema. Electronically Signed   By: Franki Cabot M.D.   On: 08/12/2020 12:42    Impression/Recommendations Active Problems:   * No active hospital problems. *  Tried hospitalist was called in to admit patient Cherri Yera for ongoing asthma exacerbation.  As noted above, I strongly urged patient to be admitted noted that I was quite concerned about her given ongoing tachypnea and mild to moderate labored breathing.  Especially given history of intubation and tracheostomy in the past, history of OSA as well as obesity, she is at high risk for respiratory decompensation.  Despite lengthy discussion with the patient, patient declined admission and stated she would like to go home.  Further care deferred to ED team.    [name] M.D. [facility] 08/12/2020, 6:18 PM Please page [facility] [email or web address] for questions

## 2020-08-12 NOTE — ED Provider Notes (Signed)
I assumed care of patient from previous team at shift change, please see their note for full H&P. Briefly patient is here for evaluation of shortness of breath. Physical Exam  BP (!) 162/85 (BP Location: Right Arm)   Pulse (!) 118   Temp 98.5 F (36.9 C) (Oral)   Resp (!) 23   SpO2 100%    Covid test has returned and is negative.  She has had albuterol and Solu-Medrol thus far.  Hour-long CAT ordered with a DuoNeb, plan to reevaluate.  Patient is reevaluated after completion of her treatment, she still has significant shortness of breath with tachypnea and increased work of breathing.  She expresses that she is scared because she has gotten the pneumonia while admitted in the past before, however is currently agreeable to admission  1728: I spoke with hospitalist who will see patient for admission.   Hospitalist went to see patient and she had changed her mind and refused admission.   I went and spoke with patient.  We discussed that her breathing is still poor and she still appears to be in mild to moderate respiratory distress despite extensive treatments.  I discussed with her that the medical recommendation is for her to be admitted.  We discussed risks of choosing against this advice including, but not limited to death, disability that may be severe, worsening of condition, pain, physical or emotional distress.  She was able to repeat these risks back to me and state her understanding.  Additionally we discussed the anticipated hospital course and treatment and that this is not something that she would be able to do on her own at home and she states her understanding of this also.  She expresses fear of getting pneumonia or getting sick while in the hospital and I discussed with her that this can happen if she is here or at home however if she is here the differences we can effectively start treatment and attempt to intervene.  I asked patient if there was anything I could do to convince  her to stay and she stated that there was not and that she was ready to go home.  I will still attempt to treat her to the extent that I am allowed to in the situation including giving her a prescription for her request a longer dose of prednisone taper stating that that is what she normally needs to help her. There was no family at bedside for me to ask to help persuade patient to stay.  I discussed with patient that if she at any point feels like she is worse or is agreeable for admission she should not hesitate to return to the emergency room and she states her understanding of this.  Patient will sign out [place].  I clarified with patient, she is requesting a 40 mg burst instead of a extended taper.   Note: Portions of this report may have been transcribed using voice recognition software. Every effort was made to ensure accuracy; however, inadvertent computerized transcription errors may be present     Ollen Gross 08/12/20 2309    Pattricia Boss, MD 08/12/20 2355

## 2020-08-14 ENCOUNTER — Telehealth: Payer: Self-pay | Admitting: Pharmacy Technician

## 2020-08-14 ENCOUNTER — Other Ambulatory Visit: Payer: Self-pay | Admitting: Family Medicine

## 2020-08-14 ENCOUNTER — Other Ambulatory Visit (HOSPITAL_COMMUNITY): Payer: Self-pay

## 2020-08-14 DIAGNOSIS — R232 Flushing: Secondary | ICD-10-CM

## 2020-08-14 NOTE — Telephone Encounter (Signed)
She has x1 box in fridge.

## 2020-08-14 NOTE — Telephone Encounter (Signed)
Received message from pharmacy that patient declined refilling her Dupixent. She told the pharmacy she doesn't need a refill because she has med. When asked how much she said she didn't know, because she gets injected in the office.  Routing to Infusion team to see if patient has med in the clinic fridge.  Appears patient was seen in the ED over the weekend for an exacerbation and discharged against medical advice.

## 2020-08-14 NOTE — Telephone Encounter (Signed)
Advised pharmacy to move next refill out 1 month

## 2020-08-14 NOTE — Telephone Encounter (Signed)
Requested medication (s) are due for refill today: no  Requested medication (s) are on the active medication list: yes  Last refill: 08/04/2020  Future visit scheduled: no  Notes to clinic: Patient due for follow up on 06/29/2020 Review for refill    Requested Prescriptions  Pending Prescriptions Disp Refills   cloNIDine (CATAPRES) 0.3 MG tablet [Pharmacy Med Name: CLONIDINE 0.3MG  TABLETS] 30 tablet 6    Sig: TAKE 1 TABLET(0.3 MG) BY MOUTH AT BEDTIME FOR HOT FLASHES      Cardiovascular:  Alpha-2 Agonists Failed - 08/14/2020  6:20 AM      Failed - Last BP in normal range    BP Readings from Last 1 Encounters:  08/12/20 (!) 164/152          Failed - Last Heart Rate in normal range    Pulse Readings from Last 1 Encounters:  08/12/20 (!) 122          Passed - Valid encounter within last 6 months    Recent Outpatient Visits           4 months ago 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

## 2020-08-15 ENCOUNTER — Telehealth: Payer: Self-pay | Admitting: Internal Medicine

## 2020-08-15 MED ORDER — PREDNISONE 10 MG PO TABS: ORAL_TABLET | ORAL | 0 refills | Status: AC

## 2020-08-15 NOTE — Telephone Encounter (Signed)
Offer prednisone 10 mg, # 20, 4 X 2 DAYS, 3 X 2 DAYS, 2 X 2 DAYS, 1 X 2 DAYS Thanks

## 2020-08-15 NOTE — Telephone Encounter (Signed)
April [name] went to the ED 08/12/20 due to having an asthma exacerbation. The MD at the hospital had wanted to admit her but April [name] refused admission. April [name] was given Rx for prednisone 20mg  #8 tabs for April [name] to take 2 tabs for 4 days.  Called and spoke with April [name] to see how she was doing after the ED visit and she said that she is feeling some better but states that she does want to have an extended pred taper to hold her over until she comes to see CY Monday, 4/25.  April [name] said that she is having to about 3 neb treatments a day which she says does help with her symptoms.  Dr. Annamaria Boots, please advise if you are okay prescribing another round of prednisone for April [name] to hold her over until she sees you.  Allergies  Allergen Reactions  . Contrast Media [Iodinated Diagnostic Agents] Itching    Ct contrast      Current Outpatient Medications:  .  cloNIDine (CATAPRES) 0.3 MG tablet, TAKE 1 TABLET(0.3 MG) BY MOUTH AT BEDTIME FOR HOT FLASHES, Disp: 30 tablet, Rfl: 2 .  albuterol (PROAIR HFA) 108 (90 Base) MCG/ACT inhaler, Inhale 2 puffs every 6 hourss if needed- rescue, Disp: 18 g, Rfl: 12 .  clonazePAM (KLONOPIN) 1 MG tablet, 1 tab at bedtime for sleep as needed, Disp: 30 tablet, Rfl: 5 .  dupilumab (DUPIXENT) 300 MG/2ML prefilled syringe, INJECT 300 MG INTO THE SKIN EVERY 14 (FOURTEEN) DAYS., Disp: 4 mL, Rfl: 5 .  EPINEPHrine 0.3 mg/0.3 mL IJ SOAJ injection, Inject 0.3 mg into the muscle as needed for anaphylaxis., Disp: 1 each, Rfl: 5 .  fluticasone (FLONASE) 50 MCG/ACT nasal spray, Place 1 spray into both nostrils daily., Disp: 30 g, Rfl: 2 .  Fluticasone-Umeclidin-Vilant (TRELEGY ELLIPTA) 100-62.5-25 MCG/INH AEPB, Inhale 1 puff into the lungs daily., Disp: 60 each, Rfl: 5 .  furosemide (LASIX) 40 MG tablet, Take 1 tablet (40 mg total) by mouth daily., Disp: 30 tablet, Rfl: 6 .  hydrALAZINE (APRESOLINE) 10 MG tablet, Take 1 tablet (10 mg total) by mouth every 8 (eight) hours for 30 days., Disp: 90 tablet, Rfl: 1 .   ipratropium-albuterol (DUONEB) 0.5-2.5 (3) MG/3ML SOLN, Nebulize 1 ampule every 6 hours if needed, Disp: 360 mL, Rfl: 12 .  metoprolol tartrate (LOPRESSOR) 25 MG tablet, Take 1 tablet (25 mg total) by mouth 2 (two) times daily. Dx: HTN, Disp: 60 tablet, Rfl: 6 .  montelukast (SINGULAIR) 10 MG tablet, Take 1 tablet (10 mg total) by mouth at bedtime., Disp: 30 tablet, Rfl: 6 .  nicotine (NICODERM CQ - DOSED IN MG/24 HOURS) 21 mg/24hr patch, Place 1 patch (21 mg total) onto the skin daily., Disp: 28 patch, Rfl: 1 .  omeprazole (PRILOSEC) 20 MG capsule, Take 1 capsule (20 mg total) by mouth 2 (two) times daily before a meal., Disp: 60 capsule, Rfl: 6 .  oxyCODONE-acetaminophen (PERCOCET) 10-325 MG tablet, Take 1 tablet by mouth 5 (five) times daily as needed for pain., Disp: , Rfl:  .  OZEMPIC, 1 MG/DOSE, 2 MG/1.5ML SOPN, Inject 1 mg into the skin every Wednesday. , Disp: , Rfl:  .  pregabalin (LYRICA) 75 MG capsule, Take 75 mg by mouth 2 (two) times daily., Disp: , Rfl:  .  Vitamin D, Ergocalciferol, (DRISDOL) 1.25 MG (50000 UT) CAPS capsule, TAKE 1 CAPSULE BY MOUTH 1 TIME A WEEK (Patient taking differently: Take 50,000 Units by mouth every Wednesday.), Disp: 9 capsule, Rfl: 1 .  VYVANSE 70 MG  capsule, Take 70 mg by mouth daily., Disp: , Rfl:  .  zolpidem (AMBIEN) 5 MG tablet, TAKE 1 TABLET BY MOUTH AT BEDTIME AS NEEDED FOR SLEEP, Disp: 30 tablet, Rfl: 5

## 2020-08-15 NOTE — Telephone Encounter (Signed)
Attempted to call pt but unable to reach. Left message for her to return call. I have pended the Rx for prednisone that we will send to pharmacy once pt calls back.   Due to this message being routed directly back to me, routing it to triage pool so we can follow up on and call pt back if pt has not returned call.

## 2020-08-15 NOTE — Telephone Encounter (Signed)
Pt returned call. Stated to pt that we were sending pred taper to the pharmacy for her and she verbalized understanding. Verified preferred pharmacy and sent Rx in for pt. Nothing further needed.

## 2020-08-18 NOTE — Progress Notes (Signed)
Patient ID: April [name], female    DOB: 1970-08-02, 50 y.o.   MRN: [id number]   HPI F former smoker followed for chronic severe asthma/ Xolair complicated by non-compliance,  OSA/ CPAP,  morbid obesity, HBP, depression, GERD, AFib/ Eliquis  NPSG 03/31/07- AHI 5.8/ hr, weight 330 lbs. She canceled planned more recent study. Office spirometry 10/24/14- severe obstructive airways disease. FVC 2.10/66%, FEV1 1.25/47%, FEV1/FVC 0.59, FEF 25-75 percent 0.64/19% Unattended HST 07/06/16- AHI 11/hour, desaturation to 74%. Office Spirometry 07/29/2016-moderately severe obstructive airways disease. FVC 2.21/68%, FEV1 1.53/57%, ratio 0.69, FEF 25-75% 0.98/34% Prolonged hosp [facility] 4/19- tracheostomy. Dupixent enrolled 10/22/2018 HST 12/26/2018- AHI 9.7/ hr, desaturation to 9.7/ hr, desaturation to 69% -------------------------------------------------------   03/31/20- 50 year old female former smoker followed for chronic severe asthma/Xolair, Tracheostomy at [facility],  complicated by noncompliance, OSA/ oral appliance, morbid obesity, HBP, depression, GERD Dupixent enrolled 10/22/2018-- every 2 weeks Ventolin hfa, Breo 200, Singulair Had failed CPAP- intol- has oral appliance ED- 03/14/20- Asthma exacerbation>>nebs, decadron, ------Moderate asthma and OSA- wheezing and prod cough with yellow sputum for the past couple of weeks. She states not sleeping well on ambien 5 mg. She is using her albuterol inhaler 3 x daily on average and neb about 2 x per day.  CXR 1V- 03/18/20-  Covid vax- none documented but she claims. Flu vax- had Caught a "cold" about 2 weeks ago. Nasal and chest congestion, cough, light yellow mucus, no fever. Slowly better.Wants to avoid ER.  Also says ambien not helping insomnia. Willing to retry clonazepam.   08/21/20- 50 year old female former smoker followed for chronic severe asthma/Xolair, Tracheostomy at [facility] 2019, Insomnia,  complicated by noncompliance, OSA/ oral appliance,  morbid obesity, HBP, depression, GERD Dupixent enrolled 10/22/2018-- every 2 weeks Proair hfa, Trelegy  100, Singulair, Neb Duoneb, Dupixent, Ambien 5,  Had failed CPAP- intol- has oral appliance -ED 4/16- asthma exacerb.> Prednisone ending. Resp viral panel was negative. OSA- failed to tolerate oral appliance. ------ASTHMA- coughing, wheezing, congestion. Body weight today - 306 lbs She reports having had 3 Phizer- no card and no documentation. I think she has been compliant with Dupixent. Due today at infusion center. She blames pollen season for recent worse control. Out of neb solution. Reports compliance with meds. Never dx'd Diabetic, but I noted elevated sugars in lab. CXR 08/12/20- 1V- IMPRESSION: No active disease. No evidence of pneumonia or pulmonary edema.  Review of Systems-See HPI  + = positive Constitutional:   + weight loss, night sweats, fevers, chills, +fatigue, lassitude. HEENT:   No-  headaches, difficulty swallowing, tooth/dental problems, sore throat,    sneezing, itching, ear ache, +nasal congestion, post nasal drip,  CV:  No-   chest pain, orthopnea, PND, swelling in lower extremities, anasarca, dizziness, palpitations Resp: +shortness of breath with exertion or at rest.              productive cough,  + non-productive cough,  No- coughing up of blood.              No-   change in color of mucus. +wheezing.   Skin: No-   rash or lesions. GI:  No-   heartburn, indigestion, abdominal pain, nausea, vomiting,  GU: MS:  No-   joint pain or swelling.   Neuro-     nothing unusual Psych:  No- change in mood or affect. No depression or anxiety.  No memory loss.    Objective:   Physical Exam    General- Alert, Oriented, Affect-appropriate,  Distress- none acute, + morbid  obesity,  Skin- rash-none, lesions- none, excoriation- none Lymphadenopathy- none Head- atraumatic            Eyes- Gross vision intact, PERRLA, conjunctivae clear secretions            Ears- Hearing,  canals-normal            Nose- Clear, no-Septal dev, mucus, polyps, erosion, perforation. + sniffing            Throat- Mallampati III , mucosa clear , drainage- none, tonsils- atrophic,                           + Missing  teeth, +upper denture plate is out                  Neck- flexible , trachea+ stoma scar, no stridor , thyroid nl, carotid no bruit Chest - symmetrical excursion , unlabored           Heart/CV- RRR , no murmur , no gallop  , no rub, nl s1 s2                           - JVD- none , edema- none, stasis changes- none, varices- none           Lung- +Diminished/unlabored.  Wheeze + bases, cough-none,                                               dullness-none,  rub- none           Chest wall-  Abd-  Br/ Gen/ Rectal- Not done, not indicated Extrem- cyanosis- none, clubbing, none, atrophy- none, strength- nl Neuro- grossly intact to observation

## 2020-08-21 ENCOUNTER — Ambulatory Visit (INDEPENDENT_AMBULATORY_CARE_PROVIDER_SITE_OTHER): Payer: Medicare Other | Admitting: *Deleted

## 2020-08-21 ENCOUNTER — Other Ambulatory Visit: Payer: Self-pay

## 2020-08-21 ENCOUNTER — Ambulatory Visit (INDEPENDENT_AMBULATORY_CARE_PROVIDER_SITE_OTHER): Payer: Medicare Other | Admitting: Internal Medicine

## 2020-08-21 ENCOUNTER — Encounter: Payer: Self-pay | Admitting: Internal Medicine

## 2020-08-21 VITALS — BP 196/140 | HR 82 | Temp 98.0°F | Resp 20

## 2020-08-21 VITALS — BP 140/100 | HR 90 | Temp 97.3°F | Ht 65.0 in | Wt 306.4 lb

## 2020-08-21 DIAGNOSIS — G4733 Obstructive sleep apnea (adult) (pediatric): Secondary | ICD-10-CM | POA: Diagnosis not present

## 2020-08-21 DIAGNOSIS — J4550 Severe persistent asthma, uncomplicated: Secondary | ICD-10-CM | POA: Diagnosis not present

## 2020-08-21 DIAGNOSIS — R7309 Other abnormal glucose: Secondary | ICD-10-CM | POA: Diagnosis not present

## 2020-08-21 DIAGNOSIS — J4541 Moderate persistent asthma with (acute) exacerbation: Secondary | ICD-10-CM

## 2020-08-21 DIAGNOSIS — J4551 Severe persistent asthma with (acute) exacerbation: Secondary | ICD-10-CM

## 2020-08-21 MED ORDER — METHYLPREDNISOLONE SODIUM SUCC 125 MG IJ SOLR: 125.0000 mg | Freq: Once | INTRAMUSCULAR | Status: DC | PRN

## 2020-08-21 MED ORDER — ALBUTEROL SULFATE HFA 108 (90 BASE) MCG/ACT IN AERS: 2.0000 | INHALATION_SPRAY | Freq: Once | RESPIRATORY_TRACT | Status: DC | PRN

## 2020-08-21 MED ORDER — EPINEPHRINE 0.3 MG/0.3ML IJ SOAJ: 0.3000 mg | Freq: Once | INTRAMUSCULAR | Status: DC | PRN

## 2020-08-21 MED ORDER — IPRATROPIUM-ALBUTEROL 0.5-2.5 (3) MG/3ML IN SOLN: RESPIRATORY_TRACT | 12 refills | Status: DC

## 2020-08-21 MED ORDER — SODIUM CHLORIDE 0.9 % IV SOLN: Freq: Once | INTRAVENOUS | Status: DC | PRN

## 2020-08-21 MED ORDER — TRELEGY ELLIPTA 200-62.5-25 MCG/INH IN AEPB: 200.0000 ug | INHALATION_SPRAY | Freq: Every day | RESPIRATORY_TRACT | 0 refills | Status: DC

## 2020-08-21 MED ORDER — DIPHENHYDRAMINE HCL 50 MG/ML IJ SOLN: 50.0000 mg | Freq: Once | INTRAMUSCULAR | Status: DC | PRN

## 2020-08-21 MED ORDER — DUPILUMAB 300 MG/2ML ~~LOC~~ SOSY
300.0000 mg | PREFILLED_SYRINGE | Freq: Once | SUBCUTANEOUS | Status: AC
  Administered 2020-08-21: 300 mg via SUBCUTANEOUS
  Filled 2020-08-21: qty 2

## 2020-08-21 MED ORDER — FAMOTIDINE IN NACL 20-0.9 MG/50ML-% IV SOLN: 20.0000 mg | Freq: Once | INTRAVENOUS | Status: DC | PRN

## 2020-08-21 NOTE — Assessment & Plan Note (Addendum)
Consistent with Spring pollen exacerbation. Grass pollen season will be fading in next 2 weeks.  Plan-Rather than bursting prednisone again, will refill neb solution with instructions, and sample Trelegy 200 to buy some time and hopefully avoid hospital. I asked her to discuss blood sugar with her PCP.

## 2020-08-21 NOTE — Progress Notes (Signed)
Diagnosis: Asthma  Provider:  Marshell Garfinkel, MD  Procedure: Injection  Dupixent (Dupilumab), Dose: 300 mg, Site: subcutaneous  Discharge: Condition: Good, Destination: Home . AVS declined  Performed by:  Bonnye Fava, RN

## 2020-08-21 NOTE — Patient Instructions (Addendum)
Refill sent for Duoneb neb solution  Script sent for prednisone 10 mg to take 1 daily x 2 weeks while pollen season comes to an end.  Order- Sample x 2 Trelegy 200    Inhale 1 puff then rinse mouth well, once daily. Use the samples up, then go back to your regular Trelegy 200.  You should discuss your blood sugar with your primary physician. It has been higher when you are on steroids and you may be developing diabetes.   Ok to get your Dupixent injection today.

## 2020-08-21 NOTE — Assessment & Plan Note (Signed)
Failed to tolerate CPAP or fitted oral appliance. Never severe and has lost weight.  Plan- encourage continued weight loss.

## 2020-08-21 NOTE — Assessment & Plan Note (Signed)
We will try to minimize systemic steroids. This problem needs to be followed by PCP.

## 2020-08-28 ENCOUNTER — Other Ambulatory Visit (HOSPITAL_COMMUNITY): Payer: Self-pay

## 2020-08-30 ENCOUNTER — Other Ambulatory Visit (HOSPITAL_COMMUNITY): Payer: Self-pay

## 2020-09-01 ENCOUNTER — Other Ambulatory Visit (HOSPITAL_COMMUNITY): Payer: Self-pay

## 2020-09-01 MED FILL — Dupilumab Subcutaneous Soln Prefilled Syringe 300 MG/2ML: SUBCUTANEOUS | 28 days supply | Qty: 4 | Fill #0 | Status: AC

## 2020-09-04 ENCOUNTER — Ambulatory Visit: Payer: Medicare Other

## 2020-09-04 ENCOUNTER — Other Ambulatory Visit (HOSPITAL_COMMUNITY): Payer: Self-pay

## 2020-09-05 ENCOUNTER — Other Ambulatory Visit (HOSPITAL_COMMUNITY): Payer: Self-pay

## 2020-09-06 ENCOUNTER — Ambulatory Visit (INDEPENDENT_AMBULATORY_CARE_PROVIDER_SITE_OTHER): Payer: Medicare Other

## 2020-09-06 ENCOUNTER — Other Ambulatory Visit: Payer: Self-pay

## 2020-09-06 ENCOUNTER — Other Ambulatory Visit: Payer: Self-pay | Admitting: Family Medicine

## 2020-09-06 VITALS — BP 104/71 | HR 71 | Temp 97.8°F | Resp 20

## 2020-09-06 DIAGNOSIS — J4541 Moderate persistent asthma with (acute) exacerbation: Secondary | ICD-10-CM

## 2020-09-06 DIAGNOSIS — J4550 Severe persistent asthma, uncomplicated: Secondary | ICD-10-CM

## 2020-09-06 DIAGNOSIS — G629 Polyneuropathy, unspecified: Secondary | ICD-10-CM

## 2020-09-06 MED ORDER — SODIUM CHLORIDE 0.9 % IV SOLN: Freq: Once | INTRAVENOUS | Status: DC | PRN

## 2020-09-06 MED ORDER — DIPHENHYDRAMINE HCL 50 MG/ML IJ SOLN: 50.0000 mg | Freq: Once | INTRAMUSCULAR | Status: DC | PRN

## 2020-09-06 MED ORDER — ALBUTEROL SULFATE HFA 108 (90 BASE) MCG/ACT IN AERS: 2.0000 | INHALATION_SPRAY | Freq: Once | RESPIRATORY_TRACT | Status: DC | PRN

## 2020-09-06 MED ORDER — METHYLPREDNISOLONE SODIUM SUCC 125 MG IJ SOLR: 125.0000 mg | Freq: Once | INTRAMUSCULAR | Status: DC | PRN

## 2020-09-06 MED ORDER — EPINEPHRINE 0.3 MG/0.3ML IJ SOAJ: 0.3000 mg | Freq: Once | INTRAMUSCULAR | Status: DC | PRN

## 2020-09-06 MED ORDER — FAMOTIDINE IN NACL 20-0.9 MG/50ML-% IV SOLN: 20.0000 mg | Freq: Once | INTRAVENOUS | Status: DC | PRN

## 2020-09-06 MED ORDER — DUPILUMAB 300 MG/2ML ~~LOC~~ SOSY
300.0000 mg | PREFILLED_SYRINGE | Freq: Once | SUBCUTANEOUS | Status: AC
  Administered 2020-09-06: 300 mg via SUBCUTANEOUS

## 2020-09-06 NOTE — Telephone Encounter (Signed)
Requested medication (s) are due for refill today:yes  Requested medication (s) are on the active medication list:yes  Last refill:  08/10/2020  Future visit scheduled:no  Notes to clinic: last appt was 02/23/2020 Patient was advised to follow up in 3 months at last appt Review for refill    Requested Prescriptions  Pending Prescriptions Disp Refills   montelukast (SINGULAIR) 10 MG tablet [Pharmacy Med Name: MONTELUKAST 10MG  TABLETS] 30 tablet 6    Sig: TAKE 1 TABLET(10 MG) BY MOUTH AT BEDTIME      Pulmonology:  Leukotriene Inhibitors Passed - 09/06/2020  7:02 AM      Passed - Valid encounter within last 12 months    Recent Outpatient Visits           5 months ago Encounter for [employer] annual wellness exam   [place], [place]i, MD   6 months ago Hot flashes   [place], [place], MD   8 months ago Moderate persistent asthma with exacerbation   [place], [place], MD   1 year ago Type 2 diabetes mellitus without complication, without long-term current use of insulin ([place])   [facility] [place], Charlane Ferretti, MD   1 year ago Other hemorrhoids   [place], [place], MD                  DULoxetine (CYMBALTA) 60 MG capsule [Pharmacy Med Name: DULOXETINE DR 60MG  CAPSULES] 30 capsule 6    Sig: TAKE 1 CAPSULE(60 MG) BY MOUTH DAILY      Psychiatry: Antidepressants - SNRI Failed - 09/06/2020  7:02 AM      Failed - Last BP in normal range    BP Readings from Last 1 Encounters:  08/21/20 (!) 196/140          Failed - Valid encounter within last 6 months    Recent Outpatient Visits           5 months ago Encounter for [employer] annual wellness exam   [place], [place], MD   6 months ago Hot flashes   [place], [place], MD    8 months ago Moderate persistent asthma with exacerbation   [place], [place], MD   1 year ago Type 2 diabetes mellitus without complication, without long-term current use of insulin ([place])   [facility] [place], Charlane Ferretti, MD   1 year ago Other hemorrhoids   [place], [place], MD                Passed - Completed PHQ-2 or PHQ-9 in the last 360 days

## 2020-09-06 NOTE — Progress Notes (Signed)
Diagnosis: Asthma  Provider:  Marshell Garfinkel, MD  Procedure: Injection  Dupixent (Dupilumab), Dose: 300 mg, Site: subcutaneous  Discharge: Condition: Good, Destination: Home . AVS provided to patient.   Performed by:  Arnoldo Morale, RN

## 2020-09-10 ENCOUNTER — Ambulatory Visit (HOSPITAL_COMMUNITY)
Admission: EM | Admit: 2020-09-10 | Discharge: 2020-09-10 | Disposition: A | Discharge status: Home / Self Care | Payer: Medicare Other

## 2020-09-10 ENCOUNTER — Other Ambulatory Visit: Payer: Self-pay

## 2020-09-18 ENCOUNTER — Other Ambulatory Visit: Payer: Self-pay

## 2020-09-18 ENCOUNTER — Ambulatory Visit (INDEPENDENT_AMBULATORY_CARE_PROVIDER_SITE_OTHER): Payer: Medicare Other | Admitting: *Deleted

## 2020-09-18 VITALS — BP 183/107 | HR 76 | Temp 98.1°F

## 2020-09-18 DIAGNOSIS — J4550 Severe persistent asthma, uncomplicated: Secondary | ICD-10-CM

## 2020-09-18 MED ORDER — DIPHENHYDRAMINE HCL 50 MG/ML IJ SOLN: 50.0000 mg | Freq: Once | INTRAMUSCULAR | Status: DC | PRN

## 2020-09-18 MED ORDER — METHYLPREDNISOLONE SODIUM SUCC 125 MG IJ SOLR: 125.0000 mg | Freq: Once | INTRAMUSCULAR | Status: DC | PRN

## 2020-09-18 MED ORDER — DUPILUMAB 300 MG/2ML ~~LOC~~ SOSY
300.0000 mg | PREFILLED_SYRINGE | Freq: Once | SUBCUTANEOUS | Status: AC
  Administered 2020-09-18: 300 mg via SUBCUTANEOUS
  Filled 2020-09-18: qty 2

## 2020-09-18 MED ORDER — ALBUTEROL SULFATE HFA 108 (90 BASE) MCG/ACT IN AERS: 2.0000 | INHALATION_SPRAY | Freq: Once | RESPIRATORY_TRACT | Status: DC | PRN

## 2020-09-18 MED ORDER — EPINEPHRINE 0.3 MG/0.3ML IJ SOAJ: 0.3000 mg | Freq: Once | INTRAMUSCULAR | Status: DC | PRN

## 2020-09-18 MED ORDER — SODIUM CHLORIDE 0.9 % IV SOLN: Freq: Once | INTRAVENOUS | Status: DC | PRN

## 2020-09-18 MED ORDER — FAMOTIDINE IN NACL 20-0.9 MG/50ML-% IV SOLN: 20.0000 mg | Freq: Once | INTRAVENOUS | Status: DC | PRN

## 2020-09-18 NOTE — Progress Notes (Signed)
Diagnosis: Asthma  Provider:  Marshell Garfinkel, MD  Procedure: Injection  Dupixent (Dupilumab), Dose: 300 mg, Site: subcutaneous  Discharge: Condition: Good, Destination: Home . AVS provided to patient.   Performed by:  Oren Beckmann, RN

## 2020-09-22 ENCOUNTER — Other Ambulatory Visit: Payer: Self-pay | Admitting: Internal Medicine

## 2020-09-22 ENCOUNTER — Other Ambulatory Visit (HOSPITAL_COMMUNITY): Payer: Self-pay

## 2020-09-22 DIAGNOSIS — J4541 Moderate persistent asthma with (acute) exacerbation: Secondary | ICD-10-CM

## 2020-09-28 ENCOUNTER — Other Ambulatory Visit: Payer: Self-pay | Admitting: Internal Medicine

## 2020-09-28 ENCOUNTER — Other Ambulatory Visit (HOSPITAL_COMMUNITY): Payer: Self-pay

## 2020-09-28 MED FILL — Dupilumab Subcutaneous Soln Prefilled Syringe 300 MG/2ML: SUBCUTANEOUS | 28 days supply | Qty: 4 | Fill #1 | Status: AC

## 2020-09-29 ENCOUNTER — Telehealth: Payer: Self-pay | Admitting: Internal Medicine

## 2020-09-29 MED ORDER — PREDNISONE 10 MG PO TABS: ORAL_TABLET | ORAL | 0 refills | Status: DC

## 2020-09-29 NOTE — Telephone Encounter (Signed)
Called and spoke with Patient. Patient stated she tried to get a prednisone refill, and was told there was no prescription at pharmacy.  Patient stated she is having wheezing and nonproductive cough since Monday. Advised Patient at [place] 08/21/20, she was prescribed Prednisone daily for 2 weeks.  Patient stated she never received it. Patient request prednisone prescription for [facility]  Message routed to Dr. Annamaria Boots to advise  LOV 08/21/20-Dr Young  Instructions    Return in about 6 months (around 02/20/2021). Refill sent for Duoneb neb solution  Script sent for prednisone 10 mg to take 1 daily x 2 weeks while pollen season comes to an end.  Order- Sample x 2 Trelegy 200    Inhale 1 puff then rinse mouth well, once daily. Use the samples up, then go back to your regular Trelegy 200.  You should discuss your blood sugar with your primary physician. It has been higher when you are on steroids and you may be developing diabetes.   Ok to get your Dupixent injection today.     Allergies  Allergen Reactions  . Contrast Media [Iodinated Diagnostic Agents] Itching    Ct contrast   Current Outpatient Medications on File Prior to Visit  Medication Sig Dispense Refill  . albuterol (PROAIR HFA) 108 (90 Base) MCG/ACT inhaler Inhale 2 puffs every 6 hourss if needed- rescue 18 g 12  . clonazePAM (KLONOPIN) 1 MG tablet 1 tab at bedtime for sleep as needed 30 tablet 5  . cloNIDine (CATAPRES) 0.3 MG tablet TAKE 1 TABLET(0.3 MG) BY MOUTH AT BEDTIME FOR HOT FLASHES 30 tablet 2  . DULoxetine (CYMBALTA) 60 MG capsule TAKE 1 CAPSULE(60 MG) BY MOUTH DAILY 30 capsule 6  . dupilumab (DUPIXENT) 300 MG/2ML prefilled syringe INJECT 300 MG INTO THE SKIN EVERY 14 (FOURTEEN) DAYS. 4 mL 5  . EPINEPHrine 0.3 mg/0.3 mL IJ SOAJ injection Inject 0.3 mg into the muscle as needed for anaphylaxis. 1 each 5  . fluticasone (FLONASE) 50 MCG/ACT nasal spray Place 1 spray into both nostrils daily. 30 g 2  .  Fluticasone-Umeclidin-Vilant (TRELEGY ELLIPTA) 100-62.5-25 MCG/INH AEPB Inhale 1 puff into the lungs daily. 60 each 5  . Fluticasone-Umeclidin-Vilant (TRELEGY ELLIPTA) 200-62.5-25 MCG/INH AEPB Inhale 200 mcg into the lungs daily. 28 each 0  . furosemide (LASIX) 40 MG tablet Take 1 tablet (40 mg total) by mouth daily. 30 tablet 6  . hydrALAZINE (APRESOLINE) 10 MG tablet Take 1 tablet (10 mg total) by mouth every 8 (eight) hours for 30 days. 90 tablet 1  . ipratropium-albuterol (DUONEB) 0.5-2.5 (3) MG/3ML SOLN Nebulize 1 ampule every 6 hours if needed 360 mL 12  . metoprolol tartrate (LOPRESSOR) 25 MG tablet Take 1 tablet (25 mg total) by mouth 2 (two) times daily. Dx: HTN 60 tablet 6  . montelukast (SINGULAIR) 10 MG tablet TAKE 1 TABLET(10 MG) BY MOUTH AT BEDTIME 30 tablet 0  . nicotine (NICODERM CQ - DOSED IN MG/24 HOURS) 21 mg/24hr patch Place 1 patch (21 mg total) onto the skin daily. 28 patch 1  . omeprazole (PRILOSEC) 20 MG capsule Take 1 capsule (20 mg total) by mouth 2 (two) times daily before a meal. 60 capsule 6  . oxyCODONE-acetaminophen (PERCOCET) 10-325 MG tablet Take 1 tablet by mouth 5 (five) times daily as needed for pain.    [name] OZEMPIC, 1 MG/DOSE, 2 MG/1.5ML SOPN Inject 1 mg into the skin every Wednesday.     . pregabalin (LYRICA) 75 MG capsule Take 75 mg by  mouth 2 (two) times daily.    . Vitamin D, Ergocalciferol, (DRISDOL) 1.25 MG (50000 UT) CAPS capsule TAKE 1 CAPSULE BY MOUTH 1 TIME A WEEK (Patient taking differently: Take 50,000 Units by mouth every Wednesday.) 9 capsule 1  . VYVANSE 70 MG capsule Take 70 mg by mouth daily.    [name] zolpidem (AMBIEN) 5 MG tablet TAKE 1 TABLET BY MOUTH AT BEDTIME AS NEEDED FOR SLEEP 30 tablet 5  . [DISCONTINUED] mometasone-formoterol (DULERA) 200-5 MCG/ACT AERO Inhale 2 puffs into the lungs 2 (two) times daily. Dx: Asthma 8.8 g 3   No current facility-administered medications on file prior to visit.

## 2020-09-29 NOTE — Telephone Encounter (Signed)
Called and spoke with Patient.  Dr. Janee Morn recommendations given. Understanding stated.  Prednisone prescription sent to requested [place].  Nothing further at this time.

## 2020-09-29 NOTE — Telephone Encounter (Signed)
Please repeat the prednisone sccript from April Thanks

## 2020-10-02 ENCOUNTER — Ambulatory Visit: Payer: Medicare Other

## 2020-10-04 ENCOUNTER — Telehealth: Payer: Self-pay

## 2020-10-04 NOTE — Telephone Encounter (Signed)
Refill request sent in from [facility] to refill Clonazepam 1mg  #30 for patient.  Last Rx sent in on 03/31/20 for #30, 5RF  Last OV 08/21/20  Dr. Annamaria Boots please advise  Thank you

## 2020-10-05 ENCOUNTER — Other Ambulatory Visit: Payer: Self-pay | Admitting: Internal Medicine

## 2020-10-05 ENCOUNTER — Ambulatory Visit (INDEPENDENT_AMBULATORY_CARE_PROVIDER_SITE_OTHER): Payer: Medicare Other | Admitting: *Deleted

## 2020-10-05 ENCOUNTER — Other Ambulatory Visit: Payer: Self-pay

## 2020-10-05 VITALS — BP 143/83 | HR 65 | Temp 98.2°F | Resp 18

## 2020-10-05 DIAGNOSIS — J4550 Severe persistent asthma, uncomplicated: Secondary | ICD-10-CM

## 2020-10-05 MED ORDER — CLONAZEPAM 1 MG PO TABS: ORAL_TABLET | ORAL | 5 refills | Status: DC

## 2020-10-05 MED ORDER — DIPHENHYDRAMINE HCL 50 MG/ML IJ SOLN: 50.0000 mg | Freq: Once | INTRAMUSCULAR | Status: DC | PRN

## 2020-10-05 MED ORDER — SODIUM CHLORIDE 0.9 % IV SOLN: Freq: Once | INTRAVENOUS | Status: DC | PRN

## 2020-10-05 MED ORDER — METHYLPREDNISOLONE SODIUM SUCC 125 MG IJ SOLR: 125.0000 mg | Freq: Once | INTRAMUSCULAR | Status: DC | PRN

## 2020-10-05 MED ORDER — EPINEPHRINE 0.3 MG/0.3ML IJ SOAJ: 0.3000 mg | Freq: Once | INTRAMUSCULAR | Status: DC | PRN

## 2020-10-05 MED ORDER — ALBUTEROL SULFATE HFA 108 (90 BASE) MCG/ACT IN AERS: 2.0000 | INHALATION_SPRAY | Freq: Once | RESPIRATORY_TRACT | Status: DC | PRN

## 2020-10-05 MED ORDER — FAMOTIDINE IN NACL 20-0.9 MG/50ML-% IV SOLN: 20.0000 mg | Freq: Once | INTRAVENOUS | Status: DC | PRN

## 2020-10-05 MED ORDER — DUPILUMAB 300 MG/2ML ~~LOC~~ SOSY
300.0000 mg | PREFILLED_SYRINGE | Freq: Once | SUBCUTANEOUS | Status: AC
  Administered 2020-10-05: 300 mg via SUBCUTANEOUS
  Filled 2020-10-05: qty 2

## 2020-10-05 NOTE — Progress Notes (Signed)
Diagnosis: Asthma  Provider:  Marshell Garfinkel, MD  Procedure: Injection  Dupixent (Dupilumab), Dose: 300 mg, Site: subcutaneous  Discharge: Condition: Good, Destination: Home . AVS provided to patient.   Performed by:  Oren Beckmann, RN

## 2020-10-05 NOTE — Progress Notes (Deleted)
Diagnosis: Asthma  Provider:  Marshell Garfinkel, MD  Procedure: Injection  Dupixent (Dupilumab), Dose: 300 mg, Site: subcutaneous  Discharge: Condition: Good, Destination: Home . AVS provided to patient.   Performed by:  Jonelle Sidle, RN

## 2020-10-09 ENCOUNTER — Other Ambulatory Visit (HOSPITAL_COMMUNITY): Payer: Self-pay

## 2020-10-10 ENCOUNTER — Telehealth: Payer: Self-pay | Admitting: Internal Medicine

## 2020-10-10 NOTE — Telephone Encounter (Signed)
LMTCB

## 2020-10-11 NOTE — Telephone Encounter (Signed)
Pt calling back to speak with nurse regarding her request for medication refill . Please advise

## 2020-10-11 NOTE — Telephone Encounter (Signed)
Spoke with patient who states her grandson threw her bag of medications away and that included her Clonazepam. She states that pharmacy states they need [place] to call and give them prior authorization to fill it. Pharmacy is [facility] [address]   Dr. Annamaria Boots please advise

## 2020-10-11 NOTE — Telephone Encounter (Signed)
Left message for patient to call back  

## 2020-10-12 ENCOUNTER — Telehealth: Payer: Self-pay | Admitting: Internal Medicine

## 2020-10-12 MED ORDER — CLONAZEPAM 1 MG PO TABS: ORAL_TABLET | ORAL | 5 refills | Status: DC

## 2020-10-12 NOTE — Telephone Encounter (Signed)
Called and spoke with pharmacy to let them know that we are ok with them refilling RX for patient. They expressed understanding. I called and spoke with patient to give her the update and she expressed understanding. Nothing further needed at this time.

## 2020-10-12 NOTE — Telephone Encounter (Signed)
Authorized refill clonazepam- sent to [facility]

## 2020-10-12 NOTE — Telephone Encounter (Signed)
ATC patient, LMTCB 

## 2020-10-13 ENCOUNTER — Other Ambulatory Visit: Payer: Self-pay | Admitting: Family Medicine

## 2020-10-13 DIAGNOSIS — J4541 Moderate persistent asthma with (acute) exacerbation: Secondary | ICD-10-CM

## 2020-10-13 NOTE — Telephone Encounter (Signed)
Requested medication (s) are due for refill today:   Yes  Requested medication (s) are on the active medication list:   Yes  Future visit scheduled:   No     Last ordered: 09/07/2020 #30, 0 refills  Returned because no future appts noted.    LOV was 03/30/2020 with Dr. Margarita Rana.   Not sure she is still seeing Dr. Margarita Rana.  Provider to review for refill since 30 day courtesy refill has been given.   Requested Prescriptions  Pending Prescriptions Disp Refills   montelukast (SINGULAIR) 10 MG tablet [Pharmacy Med Name: MONTELUKAST 10MG  TABLETS] 30 tablet 0    Sig: TAKE 1 TABLET(10 MG) BY MOUTH AT BEDTIME      Pulmonology:  Leukotriene Inhibitors Passed - 10/13/2020  6:23 AM      Passed - Valid encounter within last 12 months    Recent Outpatient Visits           6 months ago Encounter for [employer] annual wellness exam   [place], Charlane Ferretti, MD   7 months ago Hot flashes   [place], [place], MD   9 months ago Moderate persistent asthma with exacerbation   [place], [place], MD   1 year ago Type 2 diabetes mellitus without complication, without long-term current use of insulin ([place])   [facility] [place], Charlane Ferretti, MD   1 year ago Other hemorrhoids   [facility] [name], MD

## 2020-10-16 ENCOUNTER — Ambulatory Visit: Payer: Medicare Other

## 2020-10-16 ENCOUNTER — Other Ambulatory Visit (HOSPITAL_COMMUNITY): Payer: Self-pay

## 2020-10-19 ENCOUNTER — Ambulatory Visit: Payer: Medicare Other

## 2020-10-20 ENCOUNTER — Other Ambulatory Visit: Payer: Self-pay

## 2020-10-20 ENCOUNTER — Ambulatory Visit (INDEPENDENT_AMBULATORY_CARE_PROVIDER_SITE_OTHER): Payer: Medicare Other

## 2020-10-20 VITALS — BP 174/135 | HR 82 | Temp 97.5°F

## 2020-10-20 DIAGNOSIS — J4550 Severe persistent asthma, uncomplicated: Secondary | ICD-10-CM | POA: Diagnosis not present

## 2020-10-20 MED ORDER — DUPILUMAB 300 MG/2ML ~~LOC~~ SOSY
300.0000 mg | PREFILLED_SYRINGE | Freq: Once | SUBCUTANEOUS | Status: AC
  Administered 2020-10-20: 300 mg via SUBCUTANEOUS
  Filled 2020-10-20: qty 2

## 2020-10-20 MED ORDER — EPINEPHRINE 0.3 MG/0.3ML IJ SOAJ: 0.3000 mg | Freq: Once | INTRAMUSCULAR | Status: DC | PRN

## 2020-10-20 MED ORDER — DIPHENHYDRAMINE HCL 50 MG/ML IJ SOLN: 50.0000 mg | Freq: Once | INTRAMUSCULAR | Status: DC | PRN

## 2020-10-20 MED ORDER — METHYLPREDNISOLONE SODIUM SUCC 125 MG IJ SOLR: 125.0000 mg | Freq: Once | INTRAMUSCULAR | Status: DC | PRN

## 2020-10-20 MED ORDER — FAMOTIDINE IN NACL 20-0.9 MG/50ML-% IV SOLN: 20.0000 mg | Freq: Once | INTRAVENOUS | Status: DC | PRN

## 2020-10-20 MED ORDER — ALBUTEROL SULFATE HFA 108 (90 BASE) MCG/ACT IN AERS: 2.0000 | INHALATION_SPRAY | Freq: Once | RESPIRATORY_TRACT | Status: DC | PRN

## 2020-10-20 MED ORDER — SODIUM CHLORIDE 0.9 % IV SOLN: Freq: Once | INTRAVENOUS | Status: DC | PRN

## 2020-10-20 NOTE — Progress Notes (Signed)
Diagnosis: Asthma  Provider:  Marshell Garfinkel, MD  Procedure: Injection  Dupixent (Dupilumab), Dose: 300 mg, Site: subcutaneous  Discharge: Condition: Good, Destination: Home . AVS provided to patient.   Performed by:  Jonelle Sidle, RN

## 2020-10-23 ENCOUNTER — Other Ambulatory Visit (HOSPITAL_COMMUNITY): Payer: Self-pay

## 2020-10-23 MED FILL — Dupilumab Subcutaneous Soln Prefilled Syringe 300 MG/2ML: SUBCUTANEOUS | 28 days supply | Qty: 4 | Fill #2 | Status: AC

## 2020-10-31 ENCOUNTER — Ambulatory Visit: Payer: Medicare Other

## 2020-11-02 ENCOUNTER — Other Ambulatory Visit: Payer: Self-pay

## 2020-11-02 ENCOUNTER — Ambulatory Visit (INDEPENDENT_AMBULATORY_CARE_PROVIDER_SITE_OTHER): Payer: Medicare Other | Admitting: *Deleted

## 2020-11-02 ENCOUNTER — Other Ambulatory Visit: Payer: Self-pay | Admitting: Family Medicine

## 2020-11-02 VITALS — BP 161/91 | HR 62 | Temp 97.9°F | Resp 18

## 2020-11-02 DIAGNOSIS — J4550 Severe persistent asthma, uncomplicated: Secondary | ICD-10-CM | POA: Diagnosis not present

## 2020-11-02 DIAGNOSIS — J4541 Moderate persistent asthma with (acute) exacerbation: Secondary | ICD-10-CM

## 2020-11-02 MED ORDER — EPINEPHRINE 0.3 MG/0.3ML IJ SOAJ: 0.3000 mg | Freq: Once | INTRAMUSCULAR | Status: DC | PRN

## 2020-11-02 MED ORDER — METHYLPREDNISOLONE SODIUM SUCC 125 MG IJ SOLR: 125.0000 mg | Freq: Once | INTRAMUSCULAR | Status: DC | PRN

## 2020-11-02 MED ORDER — SODIUM CHLORIDE 0.9 % IV SOLN: Freq: Once | INTRAVENOUS | Status: DC | PRN

## 2020-11-02 MED ORDER — ALBUTEROL SULFATE HFA 108 (90 BASE) MCG/ACT IN AERS: 2.0000 | INHALATION_SPRAY | Freq: Once | RESPIRATORY_TRACT | Status: DC | PRN

## 2020-11-02 MED ORDER — DIPHENHYDRAMINE HCL 50 MG/ML IJ SOLN: 50.0000 mg | Freq: Once | INTRAMUSCULAR | Status: DC | PRN

## 2020-11-02 MED ORDER — DUPILUMAB 300 MG/2ML ~~LOC~~ SOSY
300.0000 mg | PREFILLED_SYRINGE | Freq: Once | SUBCUTANEOUS | Status: AC
Start: 2020-11-02 — End: 2020-11-02
  Administered 2020-11-02: 300 mg via SUBCUTANEOUS

## 2020-11-02 MED ORDER — FAMOTIDINE IN NACL 20-0.9 MG/50ML-% IV SOLN: 20.0000 mg | Freq: Once | INTRAVENOUS | Status: DC | PRN

## 2020-11-02 NOTE — Progress Notes (Signed)
Diagnosis: Asthma  Provider:  Marshell Garfinkel, MD  Procedure: Injection  Dupixent (Dupilumab), Dose: 300 mg, Site: subcutaneous  Discharge: Condition: Good, Destination: Home . AVS provided to patient.   Performed by:  Ludwig Lean, RN

## 2020-11-05 ENCOUNTER — Other Ambulatory Visit: Payer: Self-pay | Admitting: Family Medicine

## 2020-11-06 ENCOUNTER — Other Ambulatory Visit (HOSPITAL_COMMUNITY): Payer: Self-pay

## 2020-11-06 MED FILL — Dupilumab Subcutaneous Soln Prefilled Syringe 300 MG/2ML: SUBCUTANEOUS | 28 days supply | Qty: 4 | Fill #3 | Status: CN

## 2020-11-06 NOTE — Telephone Encounter (Signed)
Requested medication (s) are due for refill today - no  Requested medication (s) are on the active medication list =no  Future visit scheduled -no  Last refill: 05/07/19  Notes to clinic: Request for RF- medication no longer current on medication list  Requested Prescriptions  Pending Prescriptions Disp Refills   potassium chloride SA (KLOR-CON) 20 MEQ tablet [Pharmacy Med Name: POTASSIUM CL 20MEQ ER TABLETS] 30 tablet 3    Sig: TAKE 1 TABLET(20 MEQ) BY MOUTH DAILY      Endocrinology:  Minerals - Potassium Supplementation Failed - 11/05/2020 10:04 PM      Failed - K in normal range and within 360 days    Potassium  Date Value Ref Range Status  08/12/2020 3.3 (L) 3.5 - 5.1 mmol/L Final          Passed - Cr in normal range and within 360 days    Creat  Date Value Ref Range Status  05/21/2016 0.56 0.50 - 1.10 mg/dL Final   Creatinine, Ser  Date Value Ref Range Status  08/12/2020 0.79 0.44 - 1.00 mg/dL Final          Passed - Valid encounter within last 12 months    Recent Outpatient Visits           7 months ago Encounter for [employer] annual wellness exam   [place], [place], MD   8 months ago Hot flashes   [place], [place], MD   10 months ago Moderate persistent asthma with exacerbation   [place], [place], MD   1 year ago Type 2 diabetes mellitus without complication, without long-term current use of insulin ([place])   [facility] [place], Charlane Ferretti, MD   1 year ago Other hemorrhoids   [place], Charlane Ferretti, MD                    Requested Prescriptions  Pending Prescriptions Disp Refills   potassium chloride SA (KLOR-CON) 20 MEQ tablet [Pharmacy Med Name: POTASSIUM CL 20MEQ ER TABLETS] 30 tablet 3    Sig: TAKE 1 TABLET(20 MEQ) BY MOUTH DAILY      Endocrinology:  Minerals -  Potassium Supplementation Failed - 11/05/2020 10:04 PM      Failed - K in normal range and within 360 days    Potassium  Date Value Ref Range Status  08/12/2020 3.3 (L) 3.5 - 5.1 mmol/L Final          Passed - Cr in normal range and within 360 days    Creat  Date Value Ref Range Status  05/21/2016 0.56 0.50 - 1.10 mg/dL Final   Creatinine, Ser  Date Value Ref Range Status  08/12/2020 0.79 0.44 - 1.00 mg/dL Final          Passed - Valid encounter within last 12 months    Recent Outpatient Visits           7 months ago Encounter for [employer] annual wellness exam   [place], [place], MD   8 months ago Hot flashes   [place], [place], MD   10 months ago Moderate persistent asthma with exacerbation   [place], [place], MD   1 year ago Type 2 diabetes mellitus without complication, without long-term current use of insulin ([place])  [place], [place], MD   1 year ago Other hemorrhoids   [place] [name], MD

## 2020-11-07 ENCOUNTER — Other Ambulatory Visit: Payer: Self-pay | Admitting: Family Medicine

## 2020-11-07 NOTE — Telephone Encounter (Signed)
Medication discontinued 06/01/20

## 2020-11-13 ENCOUNTER — Ambulatory Visit: Payer: Medicare Other

## 2020-11-16 ENCOUNTER — Ambulatory Visit (INDEPENDENT_AMBULATORY_CARE_PROVIDER_SITE_OTHER): Payer: Medicare Other

## 2020-11-16 ENCOUNTER — Other Ambulatory Visit: Payer: Self-pay

## 2020-11-16 VITALS — BP 127/85 | HR 99 | Temp 97.4°F | Resp 20

## 2020-11-16 DIAGNOSIS — J4550 Severe persistent asthma, uncomplicated: Secondary | ICD-10-CM

## 2020-11-16 MED ORDER — DUPILUMAB 300 MG/2ML ~~LOC~~ SOSY
300.0000 mg | PREFILLED_SYRINGE | Freq: Once | SUBCUTANEOUS | Status: AC
  Administered 2020-11-16: 300 mg via SUBCUTANEOUS

## 2020-11-16 MED ORDER — ALBUTEROL SULFATE HFA 108 (90 BASE) MCG/ACT IN AERS: 2.0000 | INHALATION_SPRAY | Freq: Once | RESPIRATORY_TRACT | Status: DC | PRN

## 2020-11-16 MED ORDER — METHYLPREDNISOLONE SODIUM SUCC 125 MG IJ SOLR: 125.0000 mg | Freq: Once | INTRAMUSCULAR | Status: DC | PRN

## 2020-11-16 MED ORDER — SODIUM CHLORIDE 0.9 % IV SOLN: Freq: Once | INTRAVENOUS | Status: DC | PRN

## 2020-11-16 MED ORDER — EPINEPHRINE 0.3 MG/0.3ML IJ SOAJ: 0.3000 mg | Freq: Once | INTRAMUSCULAR | Status: DC | PRN

## 2020-11-16 MED ORDER — DIPHENHYDRAMINE HCL 50 MG/ML IJ SOLN: 50.0000 mg | Freq: Once | INTRAMUSCULAR | Status: DC | PRN

## 2020-11-16 MED ORDER — FAMOTIDINE IN NACL 20-0.9 MG/50ML-% IV SOLN: 20.0000 mg | Freq: Once | INTRAVENOUS | Status: DC | PRN

## 2020-11-16 NOTE — Progress Notes (Signed)
Diagnosis: Asthma  Provider:  Marshell Garfinkel, MD  Procedure: Injection  Dupixent (Dupilumab), Dose: 300 mg, Site: subcutaneous right arm   Discharge: Condition: Good, Destination: Home . AVS provided to patient.   Performed by:  Samoria Fedorko, Sherlon Handing, LPN

## 2020-11-17 ENCOUNTER — Ambulatory Visit
Admission: RE | Admit: 2020-11-17 | Discharge: 2020-11-17 | Disposition: A | Discharge status: Home / Self Care | Payer: Medicare Other | Source: Ambulatory Visit | Attending: Family Medicine | Admitting: Family Medicine

## 2020-11-17 DIAGNOSIS — Z0000 Encounter for general adult medical examination without abnormal findings: Secondary | ICD-10-CM

## 2020-11-17 DIAGNOSIS — Z1231 Encounter for screening mammogram for malignant neoplasm of breast: Secondary | ICD-10-CM

## 2020-11-22 ENCOUNTER — Other Ambulatory Visit (HOSPITAL_COMMUNITY): Payer: Self-pay

## 2020-11-22 MED FILL — Dupilumab Subcutaneous Soln Prefilled Syringe 300 MG/2ML: SUBCUTANEOUS | 28 days supply | Qty: 4 | Fill #3 | Status: AC

## 2020-11-24 ENCOUNTER — Telehealth: Payer: Self-pay

## 2020-11-24 NOTE — Telephone Encounter (Signed)
Pt was informed of lab results via VM.  Letter will be mailed to patient.

## 2020-11-24 NOTE — Telephone Encounter (Signed)
-----   Message from [name], MD sent at 11/21/2020  5:49 PM EDT ----- Please inform her that her mammogram is normal

## 2020-11-27 ENCOUNTER — Ambulatory Visit: Payer: Medicare Other

## 2020-11-27 ENCOUNTER — Other Ambulatory Visit (HOSPITAL_COMMUNITY): Payer: Self-pay

## 2020-11-29 ENCOUNTER — Other Ambulatory Visit: Payer: Self-pay | Admitting: Family Medicine

## 2020-11-29 DIAGNOSIS — R232 Flushing: Secondary | ICD-10-CM

## 2020-11-29 DIAGNOSIS — J4541 Moderate persistent asthma with (acute) exacerbation: Secondary | ICD-10-CM

## 2020-11-29 NOTE — Telephone Encounter (Addendum)
Patient called and advised she will need a f/u appointment, she verbalized understanding. Appointment scheduled for Tuesday, 02/20/21 at 1550 with Dr. Margarita Rana. Advised enough refills will be given to last until appointment. Patient asked if she could receive her Ambien refill, I advised to call Dr. Janee Morn office for that refill since he's the one who prescribed, she verbalized understanding.

## 2020-11-30 ENCOUNTER — Other Ambulatory Visit: Payer: Self-pay

## 2020-11-30 ENCOUNTER — Ambulatory Visit (INDEPENDENT_AMBULATORY_CARE_PROVIDER_SITE_OTHER): Payer: Medicare Other

## 2020-11-30 ENCOUNTER — Telehealth: Payer: Self-pay | Admitting: Internal Medicine

## 2020-11-30 VITALS — BP 156/88 | HR 80 | Temp 98.0°F | Resp 18

## 2020-11-30 DIAGNOSIS — J4550 Severe persistent asthma, uncomplicated: Secondary | ICD-10-CM | POA: Diagnosis not present

## 2020-11-30 MED ORDER — SODIUM CHLORIDE 0.9 % IV SOLN: Freq: Once | INTRAVENOUS | Status: DC | PRN

## 2020-11-30 MED ORDER — DIPHENHYDRAMINE HCL 50 MG/ML IJ SOLN: 50.0000 mg | Freq: Once | INTRAMUSCULAR | Status: DC | PRN

## 2020-11-30 MED ORDER — ALBUTEROL SULFATE HFA 108 (90 BASE) MCG/ACT IN AERS: 2.0000 | INHALATION_SPRAY | Freq: Once | RESPIRATORY_TRACT | Status: DC | PRN

## 2020-11-30 MED ORDER — FAMOTIDINE IN NACL 20-0.9 MG/50ML-% IV SOLN: 20.0000 mg | Freq: Once | INTRAVENOUS | Status: DC | PRN

## 2020-11-30 MED ORDER — EPINEPHRINE 0.3 MG/0.3ML IJ SOAJ: 0.3000 mg | Freq: Once | INTRAMUSCULAR | Status: DC | PRN

## 2020-11-30 MED ORDER — DUPILUMAB 300 MG/2ML ~~LOC~~ SOSY
300.0000 mg | PREFILLED_SYRINGE | Freq: Once | SUBCUTANEOUS | Status: AC
  Administered 2020-11-30: 300 mg via SUBCUTANEOUS
  Filled 2020-11-30: qty 2

## 2020-11-30 MED ORDER — ZOLPIDEM TARTRATE 5 MG PO TABS: ORAL_TABLET | ORAL | 5 refills | Status: DC

## 2020-11-30 MED ORDER — METHYLPREDNISOLONE SODIUM SUCC 125 MG IJ SOLR: 125.0000 mg | Freq: Once | INTRAMUSCULAR | Status: DC | PRN

## 2020-11-30 NOTE — Telephone Encounter (Signed)
Called and spoke with patient who needs Ambien refilled. Pharmacy of choice is [facility] on [address].   Dr. Annamaria Boots please advise

## 2020-11-30 NOTE — Telephone Encounter (Signed)
Ambien refilled

## 2020-11-30 NOTE — Telephone Encounter (Signed)
Called and spoke with patient to let her know that her refill has been sent to pharmacy. She expressed understanding. Nothing further needed at this time.

## 2020-11-30 NOTE — Progress Notes (Signed)
Diagnosis: Asthma  Provider:  Marshell Garfinkel, MD  Procedure: Injection  Dupixent (Dupilumab), Dose: 300 mg, Site: subcutaneous left arm   Discharge: Condition: Good, Destination: Home . AVS declined by patient.   Performed by:  Viktorya Arguijo, Sherlon Handing, LPN

## 2020-12-11 ENCOUNTER — Ambulatory Visit: Payer: Medicare Other

## 2020-12-14 ENCOUNTER — Ambulatory Visit (INDEPENDENT_AMBULATORY_CARE_PROVIDER_SITE_OTHER): Payer: Medicare Other

## 2020-12-14 ENCOUNTER — Other Ambulatory Visit (HOSPITAL_COMMUNITY): Payer: Self-pay

## 2020-12-14 ENCOUNTER — Other Ambulatory Visit: Payer: Self-pay

## 2020-12-14 VITALS — BP 139/82 | HR 71 | Temp 98.5°F | Resp 18

## 2020-12-14 DIAGNOSIS — J4550 Severe persistent asthma, uncomplicated: Secondary | ICD-10-CM

## 2020-12-14 MED ORDER — METHYLPREDNISOLONE SODIUM SUCC 125 MG IJ SOLR: 125.0000 mg | Freq: Once | INTRAMUSCULAR | Status: DC | PRN

## 2020-12-14 MED ORDER — FAMOTIDINE IN NACL 20-0.9 MG/50ML-% IV SOLN: 20.0000 mg | Freq: Once | INTRAVENOUS | Status: DC | PRN

## 2020-12-14 MED ORDER — SODIUM CHLORIDE 0.9 % IV SOLN: Freq: Once | INTRAVENOUS | Status: DC | PRN

## 2020-12-14 MED ORDER — EPINEPHRINE 0.3 MG/0.3ML IJ SOAJ: 0.3000 mg | Freq: Once | INTRAMUSCULAR | Status: DC | PRN

## 2020-12-14 MED ORDER — DUPILUMAB 300 MG/2ML ~~LOC~~ SOSY
300.0000 mg | PREFILLED_SYRINGE | Freq: Once | SUBCUTANEOUS | Status: AC
  Administered 2020-12-14: 300 mg via SUBCUTANEOUS

## 2020-12-14 MED ORDER — DIPHENHYDRAMINE HCL 50 MG/ML IJ SOLN: 50.0000 mg | Freq: Once | INTRAMUSCULAR | Status: DC | PRN

## 2020-12-14 MED ORDER — ALBUTEROL SULFATE HFA 108 (90 BASE) MCG/ACT IN AERS: 2.0000 | INHALATION_SPRAY | Freq: Once | RESPIRATORY_TRACT | Status: DC | PRN

## 2020-12-14 NOTE — Progress Notes (Signed)
Diagnosis: Asthma  Provider:  Marshell Garfinkel, MD  Procedure: Injection  Dupixent (Dupilumab), Dose: 300 mg, Site: subcutaneous  Discharge: Condition: Good, Destination: Home . AVS provided to patient.   Performed by:  Paul Dykes, RN

## 2020-12-19 ENCOUNTER — Other Ambulatory Visit (HOSPITAL_COMMUNITY): Payer: Self-pay

## 2020-12-20 ENCOUNTER — Other Ambulatory Visit (HOSPITAL_COMMUNITY): Payer: Self-pay

## 2020-12-21 ENCOUNTER — Other Ambulatory Visit (HOSPITAL_COMMUNITY): Payer: Self-pay

## 2020-12-22 ENCOUNTER — Other Ambulatory Visit (HOSPITAL_COMMUNITY): Payer: Self-pay

## 2020-12-25 ENCOUNTER — Other Ambulatory Visit (HOSPITAL_COMMUNITY): Payer: Self-pay

## 2020-12-25 ENCOUNTER — Ambulatory Visit: Payer: Medicare Other

## 2020-12-28 ENCOUNTER — Ambulatory Visit: Payer: Medicare Other

## 2020-12-28 ENCOUNTER — Other Ambulatory Visit (HOSPITAL_COMMUNITY): Payer: Self-pay

## 2020-12-28 MED FILL — Dupilumab Subcutaneous Soln Prefilled Syringe 300 MG/2ML: SUBCUTANEOUS | 28 days supply | Qty: 4 | Fill #4 | Status: AC

## 2021-01-02 ENCOUNTER — Ambulatory Visit: Payer: Medicare Other

## 2021-01-03 ENCOUNTER — Ambulatory Visit: Payer: Medicare Other

## 2021-01-04 ENCOUNTER — Other Ambulatory Visit: Payer: Self-pay

## 2021-01-04 ENCOUNTER — Ambulatory Visit (INDEPENDENT_AMBULATORY_CARE_PROVIDER_SITE_OTHER): Payer: Medicare Other

## 2021-01-04 VITALS — BP 168/113 | HR 94 | Temp 97.9°F | Resp 18

## 2021-01-04 DIAGNOSIS — J4550 Severe persistent asthma, uncomplicated: Secondary | ICD-10-CM

## 2021-01-04 MED ORDER — DIPHENHYDRAMINE HCL 50 MG/ML IJ SOLN: 50.0000 mg | Freq: Once | INTRAMUSCULAR | Status: DC | PRN

## 2021-01-04 MED ORDER — SODIUM CHLORIDE 0.9 % IV SOLN: Freq: Once | INTRAVENOUS | Status: DC | PRN

## 2021-01-04 MED ORDER — FAMOTIDINE IN NACL 20-0.9 MG/50ML-% IV SOLN: 20.0000 mg | Freq: Once | INTRAVENOUS | Status: DC | PRN

## 2021-01-04 MED ORDER — METHYLPREDNISOLONE SODIUM SUCC 125 MG IJ SOLR: 125.0000 mg | Freq: Once | INTRAMUSCULAR | Status: DC | PRN

## 2021-01-04 MED ORDER — DUPILUMAB 300 MG/2ML ~~LOC~~ SOSY
300.0000 mg | PREFILLED_SYRINGE | Freq: Once | SUBCUTANEOUS | Status: AC
  Administered 2021-01-04: 300 mg via SUBCUTANEOUS

## 2021-01-04 MED ORDER — EPINEPHRINE 0.3 MG/0.3ML IJ SOAJ: 0.3000 mg | Freq: Once | INTRAMUSCULAR | Status: DC | PRN

## 2021-01-04 MED ORDER — ALBUTEROL SULFATE HFA 108 (90 BASE) MCG/ACT IN AERS: 2.0000 | INHALATION_SPRAY | Freq: Once | RESPIRATORY_TRACT | Status: DC | PRN

## 2021-01-04 NOTE — Progress Notes (Signed)
Diagnosis: Asthma  Provider:  Marshell Garfinkel, MD  Procedure: Infusion  Dupixent (Dupilumab), Dose: 300 mg, Site: subcutaneous  Discharge: Condition: Good, Destination: Home . AVS provided to patient.   Performed by:  Rehema Muffley, Sherlon Handing, LPN

## 2021-01-06 ENCOUNTER — Other Ambulatory Visit: Payer: Self-pay | Admitting: Pulmonary Disease

## 2021-01-06 DIAGNOSIS — J441 Chronic obstructive pulmonary disease with (acute) exacerbation: Secondary | ICD-10-CM

## 2021-01-06 MED ORDER — PREDNISONE 10 MG PO TABS: ORAL_TABLET | ORAL | 0 refills | Status: DC

## 2021-01-06 NOTE — Progress Notes (Signed)
50 yr old female followed for obstructive lung disease reporting increased cough and wheezing.  She denies chest pain and is not dyspneic during the phone conversation.  She feels as though she is having an exacerbation and asking for course of prednisone.  She had been compliant with her inhalers. Plan: Burst and taper steroids  If fever, chills, worsening dyspnea she should seek evaluation in urgent care or ED for severe symptoms She has follow-up appt in clinic next month

## 2021-01-08 ENCOUNTER — Ambulatory Visit: Payer: Medicare Other

## 2021-01-11 ENCOUNTER — Ambulatory Visit: Payer: Medicare Other

## 2021-01-18 ENCOUNTER — Ambulatory Visit: Payer: Medicare Other

## 2021-01-19 ENCOUNTER — Other Ambulatory Visit (HOSPITAL_COMMUNITY): Payer: Self-pay

## 2021-01-19 ENCOUNTER — Ambulatory Visit: Payer: Medicare Other

## 2021-01-21 ENCOUNTER — Other Ambulatory Visit: Payer: Self-pay | Admitting: Family Medicine

## 2021-01-21 DIAGNOSIS — R232 Flushing: Secondary | ICD-10-CM

## 2021-01-22 ENCOUNTER — Other Ambulatory Visit: Payer: Self-pay | Admitting: Internal Medicine

## 2021-01-22 ENCOUNTER — Ambulatory Visit (INDEPENDENT_AMBULATORY_CARE_PROVIDER_SITE_OTHER): Payer: Medicare Other | Admitting: *Deleted

## 2021-01-22 ENCOUNTER — Ambulatory Visit: Payer: Medicare Other

## 2021-01-22 ENCOUNTER — Other Ambulatory Visit (HOSPITAL_COMMUNITY): Payer: Self-pay

## 2021-01-22 ENCOUNTER — Other Ambulatory Visit: Payer: Self-pay

## 2021-01-22 ENCOUNTER — Telehealth: Payer: Self-pay | Admitting: Internal Medicine

## 2021-01-22 VITALS — BP 168/117 | HR 80 | Temp 98.2°F | Resp 18 | Ht 66.0 in | Wt 325.2 lb

## 2021-01-22 DIAGNOSIS — J4550 Severe persistent asthma, uncomplicated: Secondary | ICD-10-CM

## 2021-01-22 DIAGNOSIS — J4551 Severe persistent asthma with (acute) exacerbation: Secondary | ICD-10-CM

## 2021-01-22 IMAGING — RF DG UGI W/ HIGH DENSITY W/O KUB
7 series · 14 of 24 positions shown · non-contrast
Comparison: Chest CT 10/08/2017, one view abdomen 08/11/2017 and
abdominal CT 03/29/2017.

CLINICAL DATA: Pre-op bariatric surgery. Evaluate for hiatal
hernia.

EXAM:
UPPER GI SERIES WITH KUB
TECHNIQUE: After obtaining a scout radiograph a routine upper GI series was
performed using thin and high density barium.
FLUOROSCOPY TIME:  Fluoroscopy Time: 1 minutes and 24 seconds of
low-dose pulsed fluoroscopy
Radiation Exposure Index (if provided by the fluoroscopic device):
400.8 mGy
Number of Acquired Spot Images: 0

[Series 1: one shot · 0.14mm/px · 1 of 1 slices shown (1 of 4)]
[im 1/1]
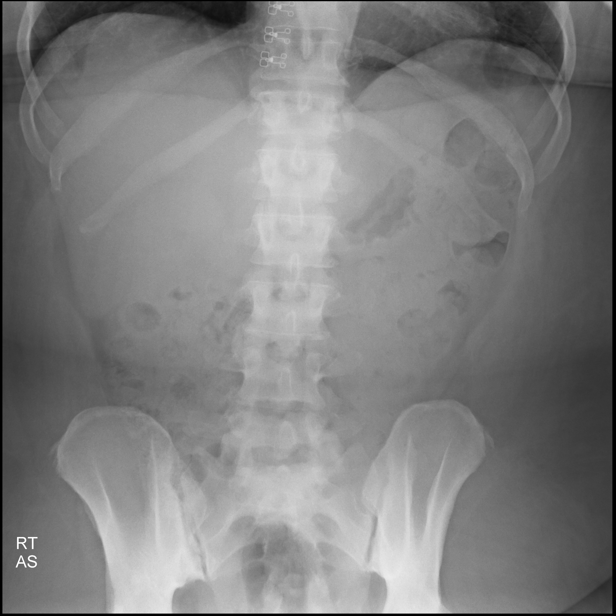

[Series 2: sequence · 1 of 21 frames shown (1 of 3)]
[frame 11/21]
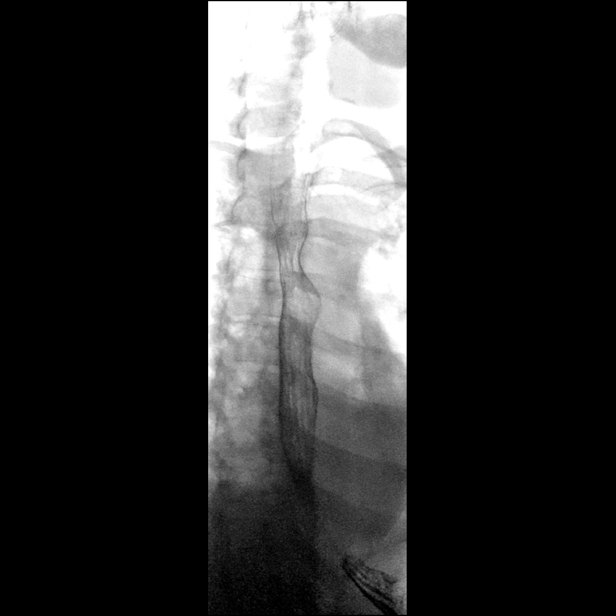

[Series 3: one shot · 2 of 3 slices shown (2 of 4)]
[im 1/3]
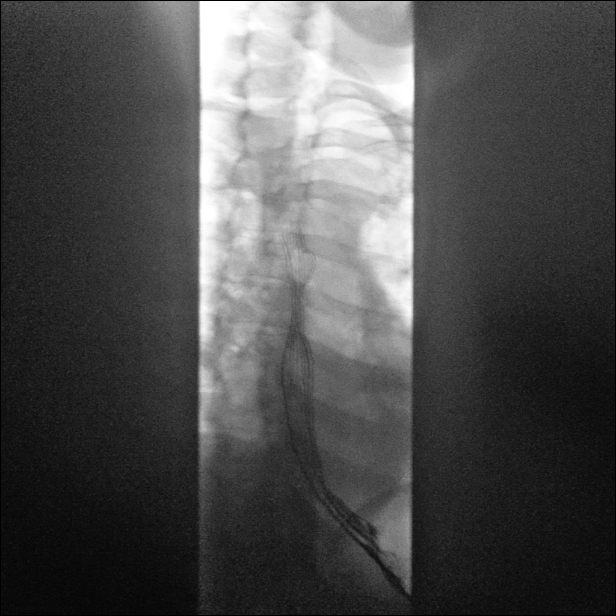
[im 3/3]
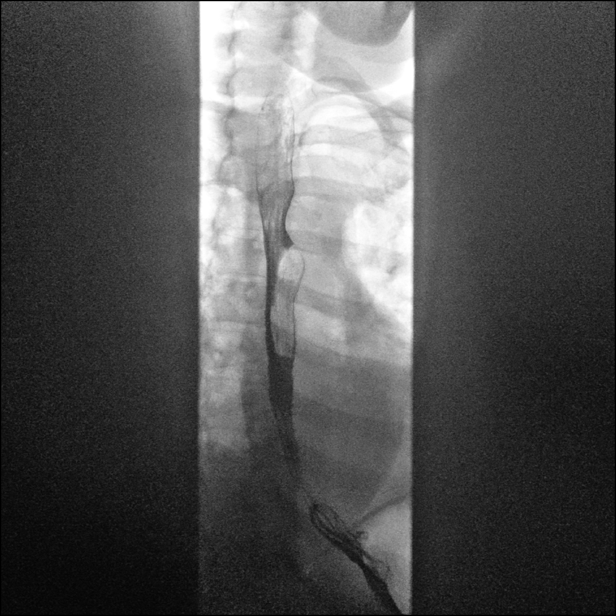

[Series 4: sequence · 2 of 15 frames shown (2 of 3)]
[frame 3/15]
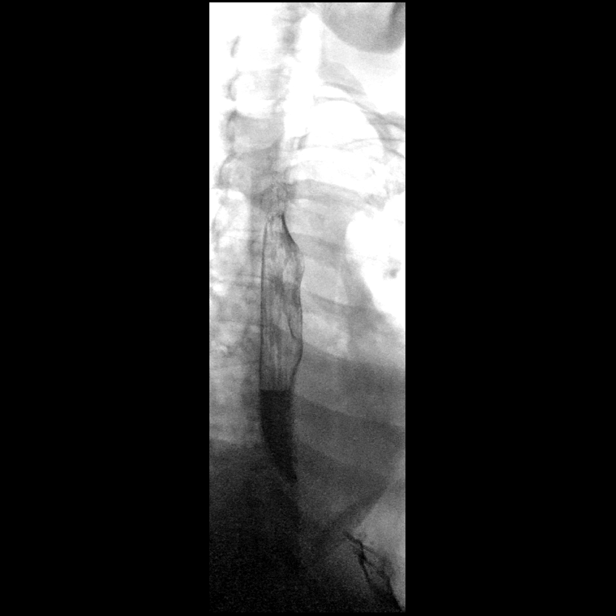
[frame 13/15]
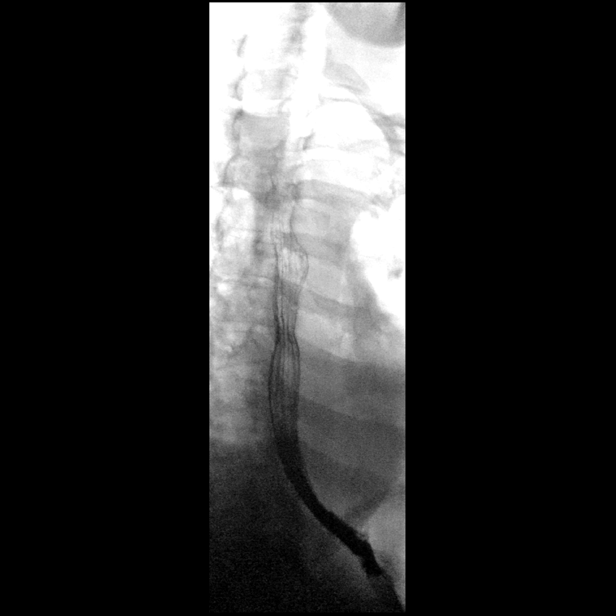

[Series 5: one shot · 3 of 5 slices shown (3 of 4)]
[im 2/5]
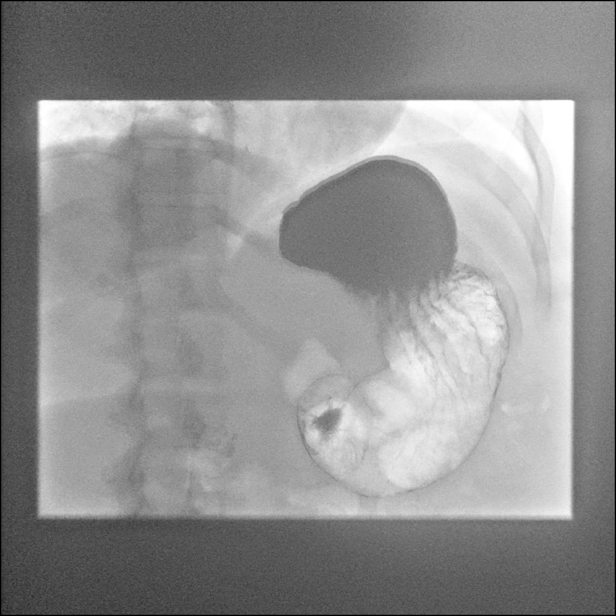
[im 3/5]
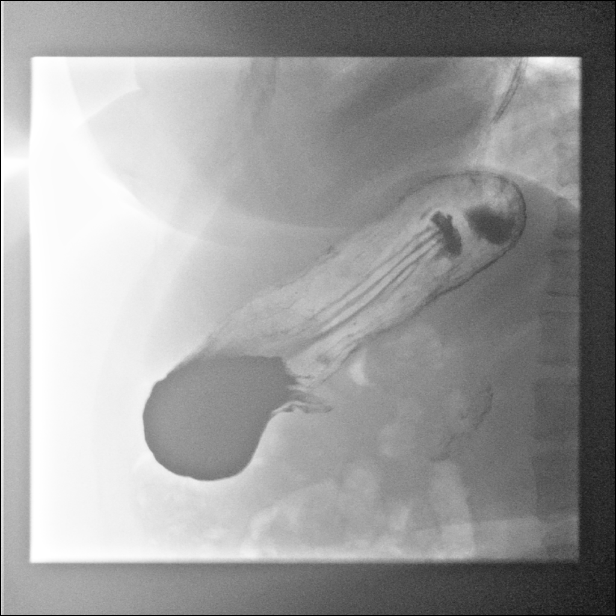
[im 5/5]
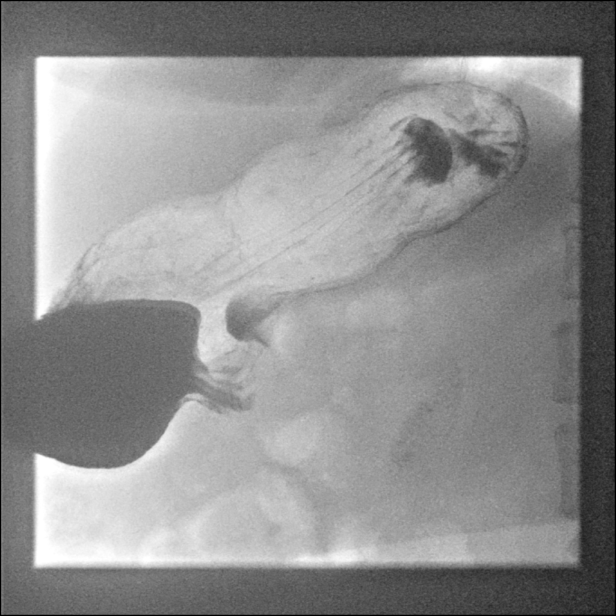

[Series 6: sequence · 1 of 15 frames shown (3 of 3)]
[frame 12/15]
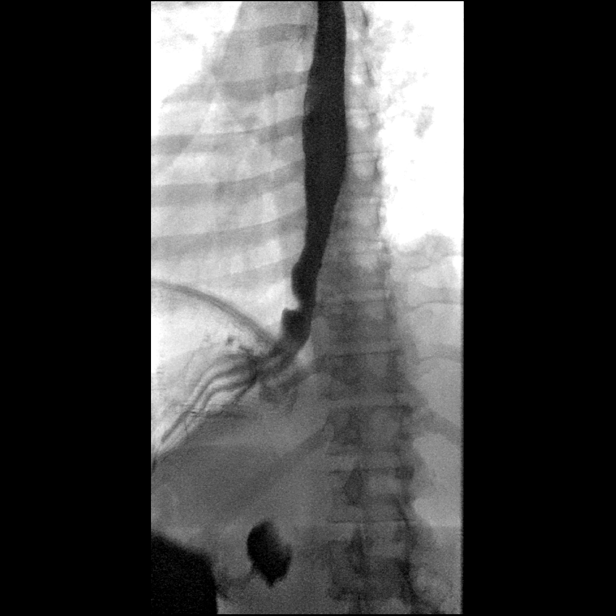

[Series 7: one shot · 4 of 7 slices shown (4 of 4)]
[im 1/7]
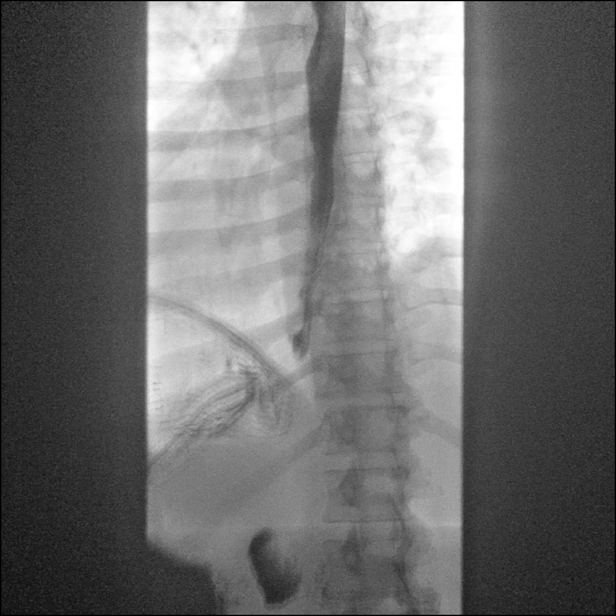
[im 2/7]
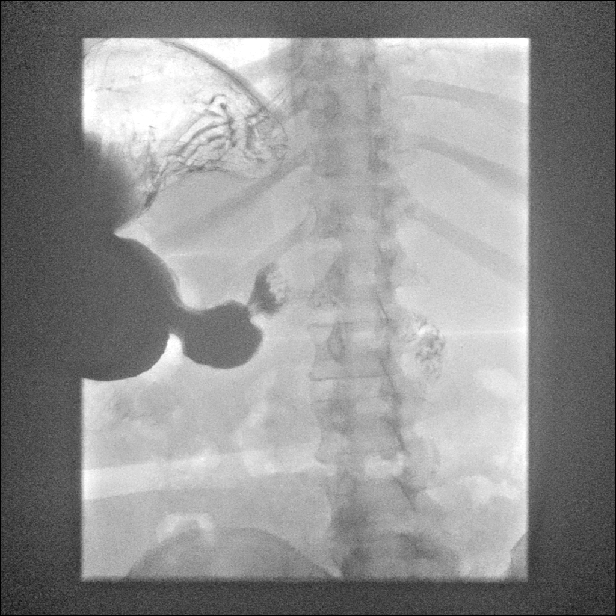
[im 5/7]
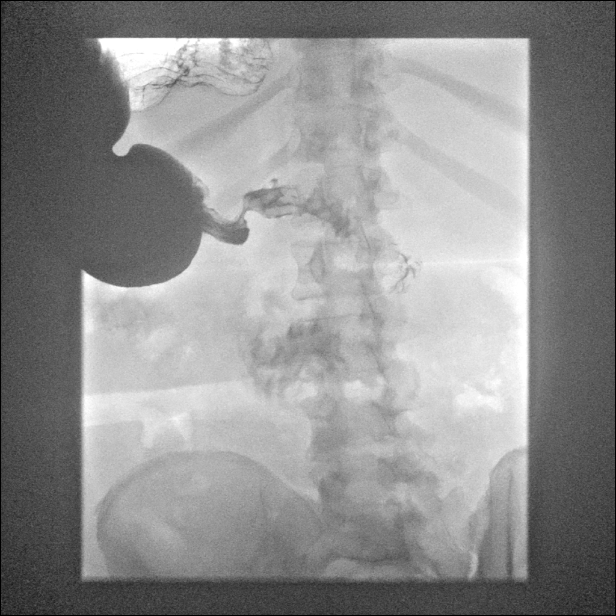
[im 7/7]
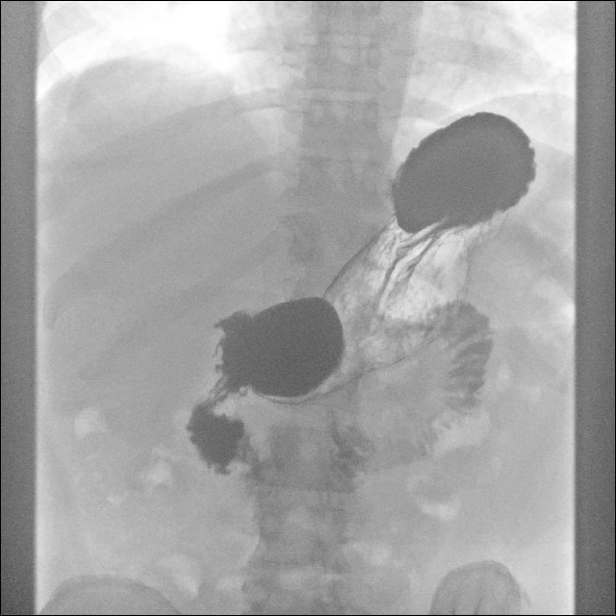

[14 of 24 positions shown; findings below may reference images not displayed]

FINDINGS: Scout abdominal radiograph demonstrates a normal bowel gas pattern.
The heart is mildly enlarged.

The patient swallowed the barium without difficulty. The esophageal
motility is normal. There is no hiatal hernia, stricture, mass or
ulceration. The stomach and duodenum appear normal without
ulceration. The ligament of Treitz is normally positioned.
IMPRESSION: Normal upper GI series.  No hiatal hernia.

## 2021-01-22 MED ORDER — DUPIXENT 300 MG/2ML ~~LOC~~ SOSY
PREFILLED_SYRINGE | SUBCUTANEOUS | 5 refills | Status: DC
  Filled 2021-01-22: qty 4, 28d supply, fill #0
  Filled 2021-03-05: qty 4, 28d supply, fill #1

## 2021-01-22 MED ORDER — METHYLPREDNISOLONE SODIUM SUCC 125 MG IJ SOLR: 125.0000 mg | Freq: Once | INTRAMUSCULAR | Status: DC | PRN

## 2021-01-22 MED ORDER — FAMOTIDINE IN NACL 20-0.9 MG/50ML-% IV SOLN: 20.0000 mg | Freq: Once | INTRAVENOUS | Status: DC | PRN

## 2021-01-22 MED ORDER — EPINEPHRINE 0.3 MG/0.3ML IJ SOAJ: 0.3000 mg | Freq: Once | INTRAMUSCULAR | Status: DC | PRN

## 2021-01-22 MED ORDER — ALBUTEROL SULFATE HFA 108 (90 BASE) MCG/ACT IN AERS: 2.0000 | INHALATION_SPRAY | Freq: Once | RESPIRATORY_TRACT | Status: DC | PRN

## 2021-01-22 MED ORDER — SODIUM CHLORIDE 0.9 % IV SOLN: Freq: Once | INTRAVENOUS | Status: DC | PRN

## 2021-01-22 MED ORDER — DUPILUMAB 300 MG/2ML ~~LOC~~ SOSY
300.0000 mg | PREFILLED_SYRINGE | Freq: Once | SUBCUTANEOUS | Status: AC
  Administered 2021-01-22: 300 mg via SUBCUTANEOUS
  Filled 2021-01-22: qty 2

## 2021-01-22 MED ORDER — DIPHENHYDRAMINE HCL 50 MG/ML IJ SOLN: 50.0000 mg | Freq: Once | INTRAMUSCULAR | Status: DC | PRN

## 2021-01-22 NOTE — Telephone Encounter (Signed)
Last RF 11/29/20 with #90 - pt needs OV before any further refills- Pt has enough med to last until after appt.

## 2021-01-22 NOTE — Telephone Encounter (Signed)
Patient came into the office asking for refills on her Prednisone and Trelegy inhaler. Patient's last OV was 08/21/20 and is scheduled for OV on 02/15/21. Per las OV note : Instructions   Return in about 6 months (around 02/20/2021). Refill sent for Duoneb neb solution   Script sent for prednisone 10 mg to take 1 daily x 2 weeks while pollen season comes to an end.   Order- Sample x 2 Trelegy 200    Inhale 1 puff then rinse mouth well, once daily. Use the samples up, then go back to your regular Trelegy 200.   You should discuss your blood sugar with your primary physician. It has been higher when you are on steroids and you may be developing diabetes.    Ok to get your Dupixent injection today.     Dr. Annamaria Boots please advise

## 2021-01-22 NOTE — Progress Notes (Signed)
Diagnosis: Asthma  Provider:  Marshell Garfinkel, MD  Procedure: Injection  Dupixent (Dupilumab), Dose: 300 mg, Site: subcutaneous  Discharge: Condition: Good, Destination: Home . AVS provided to patient.   Performed by:  Oren Beckmann, RN

## 2021-01-22 NOTE — Telephone Encounter (Signed)
Received refill request for patient's Dupixent. She receives at [facility] (couriered from [facility] [place]).  Last seen by Dr. Annamaria Boots on 08/21/20 with plan to continue [place]. Last dose was today, 01/22/21. Dose: 300mg  SQ every 14 days  [name], PharmD, MPH, BCPS Clinical Pharmacist (Rheumatology and Pulmonology)

## 2021-01-23 ENCOUNTER — Other Ambulatory Visit (HOSPITAL_COMMUNITY): Payer: Self-pay

## 2021-01-23 MED ORDER — PREDNISONE 10 MG PO TABS
ORAL_TABLET | ORAL | 0 refills | Status: DC
Start: 2021-01-23 — End: 2021-02-07

## 2021-01-23 MED ORDER — TRELEGY ELLIPTA 100-62.5-25 MCG/INH IN AEPB: 1.0000 | INHALATION_SPRAY | Freq: Every day | RESPIRATORY_TRACT | 5 refills | Status: DC

## 2021-01-23 NOTE — Telephone Encounter (Signed)
Called and spoke with pt letting her know that CY said it was okay to refill both her Trelegy and prednisone. Pt verbalized understanding. Verified preferred pharmacy and sent meds in for pt. Nothing further needed.

## 2021-01-23 NOTE — Telephone Encounter (Signed)
Ok to refill Trelegy x 6 months. Refill prednisone x 2 weeks as it was given last time.

## 2021-01-24 ENCOUNTER — Other Ambulatory Visit (HOSPITAL_COMMUNITY): Payer: Self-pay

## 2021-01-25 ENCOUNTER — Emergency Department (HOSPITAL_COMMUNITY)
Admission: EM | Admit: 2021-01-25 | Discharge: 2021-01-25 | Disposition: A | Discharge status: Home / Self Care | Payer: Medicare Other | Attending: Emergency Medicine | Admitting: Emergency Medicine

## 2021-01-25 ENCOUNTER — Ambulatory Visit: Payer: Medicare Other

## 2021-01-25 ENCOUNTER — Other Ambulatory Visit: Payer: Self-pay

## 2021-01-25 ENCOUNTER — Encounter (HOSPITAL_COMMUNITY): Payer: Self-pay

## 2021-01-25 ENCOUNTER — Ambulatory Visit (INDEPENDENT_AMBULATORY_CARE_PROVIDER_SITE_OTHER): Payer: Medicare Other | Admitting: Internal Medicine

## 2021-01-25 ENCOUNTER — Encounter: Payer: Self-pay | Admitting: Internal Medicine

## 2021-01-25 ENCOUNTER — Emergency Department (HOSPITAL_COMMUNITY): Payer: Medicare Other

## 2021-01-25 ENCOUNTER — Telehealth: Payer: Self-pay | Admitting: Internal Medicine

## 2021-01-25 DIAGNOSIS — I110 Hypertensive heart disease with heart failure: Secondary | ICD-10-CM | POA: Diagnosis not present

## 2021-01-25 DIAGNOSIS — R7303 Prediabetes: Secondary | ICD-10-CM | POA: Insufficient documentation

## 2021-01-25 DIAGNOSIS — U071 COVID-19: Secondary | ICD-10-CM | POA: Insufficient documentation

## 2021-01-25 DIAGNOSIS — J4552 Severe persistent asthma with status asthmaticus: Secondary | ICD-10-CM | POA: Diagnosis not present

## 2021-01-25 DIAGNOSIS — I5032 Chronic diastolic (congestive) heart failure: Secondary | ICD-10-CM | POA: Insufficient documentation

## 2021-01-25 DIAGNOSIS — Z7951 Long term (current) use of inhaled steroids: Secondary | ICD-10-CM | POA: Insufficient documentation

## 2021-01-25 DIAGNOSIS — Z794 Long term (current) use of insulin: Secondary | ICD-10-CM | POA: Insufficient documentation

## 2021-01-25 DIAGNOSIS — Z87891 Personal history of nicotine dependence: Secondary | ICD-10-CM | POA: Diagnosis not present

## 2021-01-25 DIAGNOSIS — R059 Cough, unspecified: Secondary | ICD-10-CM | POA: Diagnosis present

## 2021-01-25 DIAGNOSIS — J4541 Moderate persistent asthma with (acute) exacerbation: Secondary | ICD-10-CM | POA: Diagnosis not present

## 2021-01-25 DIAGNOSIS — Z79899 Other long term (current) drug therapy: Secondary | ICD-10-CM | POA: Diagnosis not present

## 2021-01-25 DIAGNOSIS — D72829 Elevated white blood cell count, unspecified: Secondary | ICD-10-CM | POA: Insufficient documentation

## 2021-01-25 DIAGNOSIS — J441 Chronic obstructive pulmonary disease with (acute) exacerbation: Secondary | ICD-10-CM | POA: Insufficient documentation

## 2021-01-25 LAB — BASIC METABOLIC PANEL
Anion gap: 9 (ref 5–15)
BUN: 12 mg/dL (ref 6–20)
CO2: 28 mmol/L (ref 22–32)
Calcium: 9 mg/dL (ref 8.9–10.3)
Chloride: 104 mmol/L (ref 98–111)
Creatinine, Ser: 0.73 mg/dL (ref 0.44–1.00)
GFR, Estimated: >60 mL/min (ref >60)
Glucose, Bld: 151 mg/dL — ABNORMAL HIGH (ref 70–99)
Potassium: 4.4 mmol/L (ref 3.5–5.1)
Sodium: 141 mmol/L (ref 135–145)

## 2021-01-25 LAB — CBC WITH DIFFERENTIAL/PLATELET
Abs Immature Granulocytes: 0.45 10*3/uL — ABNORMAL HIGH (ref 0.00–0.07)
Basophils Absolute: 0.1 10*3/uL (ref 0.0–0.1)
Basophils Relative: 0 %
Eosinophils Absolute: 0 10*3/uL (ref 0.0–0.5)
Eosinophils Relative: 0 %
HCT: 38.6 % (ref 36.0–46.0)
Hemoglobin: 11.6 g/dL — ABNORMAL LOW (ref 12.0–15.0)
Immature Granulocytes: 2 %
Lymphocytes Relative: 23 %
Lymphs Abs: 4.6 10*3/uL — ABNORMAL HIGH (ref 0.7–4.0)
MCH: 25.7 pg — ABNORMAL LOW (ref 26.0–34.0)
MCHC: 30.1 g/dL (ref 30.0–36.0)
MCV: 85.6 fL (ref 80.0–100.0)
Monocytes Absolute: 0.8 10*3/uL (ref 0.1–1.0)
Monocytes Relative: 4 %
Neutro Abs: 14.3 10*3/uL — ABNORMAL HIGH (ref 1.7–7.7)
Neutrophils Relative %: 71 %
Platelets: 464 10*3/uL — ABNORMAL HIGH (ref 150–400)
RBC: 4.51 MIL/uL (ref 3.87–5.11)
RDW: 18 % — ABNORMAL HIGH (ref 11.5–15.5)
WBC: 20.3 10*3/uL — ABNORMAL HIGH (ref 4.0–10.5)
nRBC: 0.1 % (ref 0.0–0.2)

## 2021-01-25 LAB — HCG, QUANTITATIVE, PREGNANCY: hCG, Beta Chain, Quant, S: 1 m[IU]/mL (ref <5)

## 2021-01-25 LAB — RESP PANEL BY RT-PCR (FLU A&B, COVID) ARPGX2
Influenza A by PCR: NEGATIVE
Influenza B by PCR: NEGATIVE
SARS Coronavirus 2 by RT PCR: POSITIVE — AB

## 2021-01-25 MED ORDER — PREDNISONE 20 MG PO TABS: 40.0000 mg | ORAL_TABLET | Freq: Every day | ORAL | 0 refills | Status: AC

## 2021-01-25 MED ORDER — ALBUTEROL SULFATE (2.5 MG/3ML) 0.083% IN NEBU
7.5000 mg/h | INHALATION_SOLUTION | Freq: Once | RESPIRATORY_TRACT | Status: AC
  Administered 2021-01-25: 7.5 mg/h via RESPIRATORY_TRACT
  Filled 2021-01-25: qty 9

## 2021-01-25 MED ORDER — MAGNESIUM SULFATE 2 GM/50ML IV SOLN
2.0000 g | Freq: Once | INTRAVENOUS | Status: AC
Start: 2021-01-25 — End: 2021-01-25
  Administered 2021-01-25: 2 g via INTRAVENOUS
  Filled 2021-01-25: qty 50

## 2021-01-25 MED ORDER — BENZONATATE 100 MG PO CAPS: 100.0000 mg | ORAL_CAPSULE | Freq: Three times a day (TID) | ORAL | 0 refills | Status: DC

## 2021-01-25 NOTE — Assessment & Plan Note (Signed)
Severe acute complicated/ Status asthmaticus Plan- sending to ED, anticipating admission for asthma exacerbation. Can't exclude infection.

## 2021-01-25 NOTE — ED Triage Notes (Signed)
Per EMS-0 Patient c/o cough and SOB x 2-3 days. Patient went to the pulmonologist and was prescribed Prednisone.. Today, the patient has received duo nebs x 2 and Solumedrol 125 mg IV prior to arrival to the ED. [name] told EMS that she has been doing home neb treatments with temporary relief from wheezing.

## 2021-01-25 NOTE — Progress Notes (Signed)
Patient ID: April [name], female    DOB: 06/24/1970, 50 y.o.   MRN: [id number]   HPI F former smoker followed for chronic severe asthma/ Xolair complicated by non-compliance,  OSA/ CPAP,  morbid obesity, HBP, depression, GERD, AFib/ Eliquis  NPSG 03/31/07- AHI 5.8/ hr, weight 330 lbs. She canceled planned more recent study. Office spirometry 10/24/14- severe obstructive airways disease. FVC 2.10/66%, FEV1 1.25/47%, FEV1/FVC 0.59, FEF 25-75 percent 0.64/19% Unattended HST 07/06/16- AHI 11/hour, desaturation to 74%. Office Spirometry 07/29/2016-moderately severe obstructive airways disease. FVC 2.21/68%, FEV1 1.53/57%, ratio 0.69, FEF 25-75% 0.98/34% Prolonged hosp [facility] 4/19- tracheostomy. Dupixent enrolled 10/22/2018 HST 12/26/2018- AHI 9.7/ hr, desaturation to 9.7/ hr, desaturation to 69% -------------------------------------------------------    08/21/20- 50 year old female former smoker followed for chronic severe asthma/Xolair, Tracheostomy at [facility] 2019, Insomnia,  complicated by noncompliance, OSA/ oral appliance, morbid obesity, HBP, depression, GERD Dupixent enrolled 10/22/2018-- every 2 weeks Proair hfa, Trelegy  100, Singulair, Neb Duoneb, Dupixent, Ambien 5,  Had failed CPAP- intol- has oral appliance -ED 4/16- asthma exacerb.> Prednisone ending. Resp viral panel was negative. OSA- failed to tolerate oral appliance. ------ASTHMA- coughing, wheezing, congestion. Body weight today - 306 lbs She reports having had 3 Phizer- no card and no documentation. I think she has been compliant with Dupixent. Due today at infusion center. She blames pollen season for recent worse control. Out of neb solution. Reports compliance with meds. Never dx'd Diabetic, but I noted elevated sugars in lab. CXR 08/12/20- 1V- IMPRESSION: No active disease. No evidence of pneumonia or pulmonary edema.  01/25/21- 50 year old female former smoker followed for chronic severe asthma/Xolair, Tracheostomy at [facility]  2019, Insomnia,  complicated by noncompliance, OSA/ oral appliance, morbid obesity, HBP, depression, GERD Dupixent enrolled 10/22/2018-- every 2 weeks Proair hfa, Trelegy  100, Singulair, Neb Duoneb, Dupixent, Ambien 5,  Had failed CPAP- intol- has oral appliance -----Wheezing, sob and yellow mucous.  Nebs and inhalers not helping. Covid vax- 3 Moderna      Home Covid test Neg 2 days ago Body weight today 327 lbs We sent Trelegy samples and Prednisone 2 days ago. She has beeen using neb 3-4x/ day. She feels she needs to be in hosp and I agree For Status asthmaticus   Review of Systems-See HPI  + = positive Constitutional:   + weight loss, night sweats, fevers, chills, +fatigue, lassitude. HEENT:   No-  headaches, difficulty swallowing, tooth/dental problems, sore throat,    sneezing, itching, ear ache, +nasal congestion, post nasal drip,  CV:  No-   chest pain, orthopnea, PND, swelling in lower extremities, anasarca, dizziness, palpitations Resp: +shortness of breath with exertion or at rest.              productive cough,  + non-productive cough,  No- coughing up of blood.              No-   change in color of mucus. +wheezing.   Skin: No-   rash or lesions. GI:  No-   heartburn, indigestion, abdominal pain, nausea, vomiting,  GU: MS:  No-   joint pain or swelling.   Neuro-     nothing unusual Psych:  No- change in mood or affect. No depression or anxiety.  No memory loss.    Objective:   Physical Exam    General- Alert, Oriented, Affect-appropriate, Distress- none acute, + morbid  obesity,  Skin- rash-none, lesions- none, excoriation- none Lymphadenopathy- none Head- atraumatic  Eyes- Gross vision intact, PERRLA, conjunctivae clear secretions            Ears- Hearing, canals-normal            Nose- Clear, no-Septal dev, mucus, polyps, erosion, perforation. + sniffing            Throat- Mallampati III , mucosa clear , drainage- none, tonsils- atrophic,                            + Missing  teeth, +upper denture plate is out                  Neck- flexible , trachea+ stoma scar, no stridor , thyroid nl, carotid no bruit Chest - symmetrical excursion , unlabored           Heart/CV- RRR , no murmur , no gallop  , no rub, nl s1 s2                           - JVD- none , edema- none, stasis changes- none, varices- none           Lung- +Diminished, . Wheeze + tight/ diffuse, cough-none,                          dullness-none,  rub- none            Chest wall Abd-  Br/ Gen/ Rectal- Not done, not indicated Extrem- cyanosis- none, clubbing, none, atrophy- none, strength- nl Neuro- grossly intact to observation

## 2021-01-25 NOTE — ED Notes (Signed)
Neb started  

## 2021-01-25 NOTE — ED Provider Notes (Signed)
[facility] DEPT Provider Note   CSN: [id number] Arrival date & time: 01/25/21  1712     History Chief Complaint  Patient presents with   Asthma    April [name] is a 50 y.o. female with past history of asthma, COPD ED, A. fib, OSA on CPAP who presents to the ED today via EMS from pulmonologist office for asthma exacerbation.  Patient reports that for the past 2 weeks she has had a cough, chest tightness, shortness of breath that she feels is equivalent to her previous asthma exacerbations.  She started taking prednisone yesterday, 10 mg however increased her dose from 10 to 20 mg daily.  She took that today and yesterday.  She went to her pulmonologist today who called EMS for status asthmaticus.  Patient received 2 DuoNeb treatments as well as Solu-Medrol 125 mg in route.  She does mention that she took a at home COVID test 2 days ago which returned negative.Pt has had to be admitted in the past for asthma exacerbation and has also had to be intubated x 1.  The history is provided by the patient, the EMS personnel and medical records.      Past Medical History:  Diagnosis Date   Acanthosis nigricans    Anxiety    Arthritis    "knees" (04/28/2017)   Asthma    Followed by Dr. Annamaria Boots (pulmonology); receives every other week omalizumab injections; has frequent exacerbations   Back pain    Chronic diastolic CHF (congestive heart failure) ([place]) 01/17/2017   COPD (chronic obstructive pulmonary disease) ([place])    PFTs in 2002, FEV1/FVC 65, no post bronchodilater test done   Depression    GERD (gastroesophageal reflux disease)    Headache(784.0)    "q couple days" (97/35/3299)   Helicobacter pylori (H. pylori) infection    Hypertension, essential    Insomnia    Joint pain    Lower extremity edema    Menorrhagia    Morbid obesity ([place])    OSA on CPAP    Sleep study 2008 - mild OSA, not enough events to titrate CPAP; wears CPAP now/pt on 04/28/2017    Pneumonia X 1   Prediabetes    Rheumatoid arthritis (HCC)    Seasonal allergies    Shortness of breath    Tobacco user    Vitamin D deficiency     Patient Active Problem List   Diagnosis Date Noted   Severe persistent asthma 07/04/2020   Acute respiratory failure ([place]) 02/20/2019   CHF exacerbation ([place]) 02/20/2019   Hot flashes    COPD exacerbation ([place]) 10/26/2018   Acute respiratory distress 08/03/2018   Acute exacerbation of chronic obstructive pulmonary disease (COPD) ([place]) 08/03/2018   Vitamin D deficiency 05/26/2018   Acute asthma exacerbation 05/16/2018   Urinary incontinence 11/17/2017   Nausea with vomiting 11/06/2017   Hypomagnesemia 10/27/2017   Dysphagia 10/27/2017   Physical deconditioning 10/25/2017   Acute maxillary sinusitis 10/24/2017   Emesis, persistent 10/08/2017   Volume depletion 10/08/2017   Pleuritic chest pain 10/08/2017   SOB (shortness of breath) 10/08/2017   Debility 09/22/2017   HCAP (healthcare-associated pneumonia) 08/06/2017   Chronic atrial fibrillation 07/29/2017   Acute respiratory failure with hypoxia (HCC) 07/07/2017   AF (paroxysmal atrial fibrillation) ([place])    Dyspnea 02/04/2017   Asthma exacerbation 24/26/8341   Metabolic syndrome 96/22/2979   Moderate persistent asthma with exacerbation 10/19/2016   Normocytic anemia 10/02/2016   Elevated hemoglobin A1c 05/05/2016  GERD (gastroesophageal reflux disease) 08/30/2015   Anxiety and depression 08/30/2015   Generalized anxiety disorder 08/30/2015   Seasonal allergic rhinitis 08/29/2013   Leukocytosis 10/21/2012   Tobacco abuse in remission 10/07/2012   COPD mixed type ([place]) 05/07/2012   Knee pain, bilateral 04/25/2011   Insomnia 03/14/2011   Obstructive sleep apnea 12/19/2010   Hypokalemia 08/13/2010   Cervical back pain with evidence of disc disease 04/08/2008   HTN (hypertension) 07/31/2006   Morbid obesity due to excess calories ([place]) 06/17/2006   Major depressive  disorder, recurrent episode ([place]) 04/10/2006    Past Surgical History:  Procedure Laterality Date   CARDIOVERSION N/A 05/30/2017   Procedure: CARDIOVERSION;  Surgeon: Sanda Klein, MD;  Location: [facility];  Service: Cardiovascular;  Laterality: N/A;   REDUCTION MAMMAPLASTY Bilateral 09/2011   TUBAL LIGATION  1996   bilateral     OB History     Gravida  4   Para  4   Term      Preterm      AB      Living         SAB      IAB      Ectopic      Multiple      Live Births              Family History  Problem Relation Age of Onset   Hypertension Mother    Asthma Daughter    Cancer Paternal Aunt    Asthma Maternal Grandmother     Social History   Tobacco Use   Smoking status: Former    Packs/day: 0.50    Years: 26.00    Pack years: 13.00    Types: Cigarettes    Quit date: 09/12/2014    Years since quitting: 6.3   Smokeless tobacco: Never  Vaping Use   Vaping Use: Never used  Substance Use Topics   Alcohol use: No   Drug use: No    Home Medications Prior to Admission medications   Medication Sig Start Date End Date Taking? Authorizing Provider  benzonatate (TESSALON) 100 MG capsule Take 1 capsule (100 mg total) by mouth every 8 (eight) hours. 01/25/21  Yes Kemberly Taves, PA-C  predniSONE (DELTASONE) 20 MG tablet Take 2 tablets (40 mg total) by mouth daily for 5 days. 01/25/21 01/30/21 Yes Dayzha Pogosyan, PA-C  albuterol (PROAIR HFA) 108 (90 Base) MCG/ACT inhaler Inhale 2 puffs every 6 hourss if needed- rescue 05/30/20   Deneise Lever, MD  clonazePAM [place]bye [place]) 1 MG tablet 1 tab at bedtime for sleep as needed 10/12/20   Baird Lyons D, MD  cloNIDine (CATAPRES) 0.3 MG tablet Take 1 tablet (0.3 mg total) by mouth at bedtime. OFFICE VISIT NEEDED FOR ADDITIONAL REFILLS 11/29/20   [name], MD  DULoxetine (CYMBALTA) 60 MG capsule TAKE 1 CAPSULE(60 MG) BY MOUTH DAILY 09/08/20   [place]lin, Charlane Ferretti, MD  dupilumab (DUPIXENT) 300 MG/2ML prefilled  syringe INJECT 300 MG INTO THE SKIN EVERY 14 (FOURTEEN) DAYS. 01/22/21 01/22/22  Baird Lyons D, MD  EPINEPHrine 0.3 mg/0.3 mL IJ SOAJ injection Inject 0.3 mg into the muscle as needed for anaphylaxis. 05/30/20   Deneise Lever, MD  fluticasone (FLONASE) 50 MCG/ACT nasal spray Place 1 spray into both nostrils daily. 08/12/18   [name], MD  Fluticasone-Umeclidin-Vilant (TRELEGY ELLIPTA) 100-62.5-25 MCG/INH AEPB Inhale 1 puff into the lungs daily. 01/23/21   Deneise Lever, MD  furosemide (LASIX) 40 MG tablet Take 1 tablet (40  mg total) by mouth daily. 02/23/20   [name], MD  hydrALAZINE (APRESOLINE) 10 MG tablet Take 1 tablet (10 mg total) by mouth every 8 (eight) hours for 30 days. 08/04/18 10/26/27  [name], NP  ipratropium-albuterol (DUONEB) 0.5-2.5 (3) MG/3ML SOLN Nebulize 1 ampule every 6 hours if needed 08/21/20   Baird Lyons D, MD  metoprolol tartrate (LOPRESSOR) 25 MG tablet Take 1 tablet (25 mg total) by mouth 2 (two) times daily. Dx: HTN 02/23/20   [name], MD  montelukast (SINGULAIR) 10 MG tablet Take 1 tablet (10 mg total) by mouth at bedtime. OFFICE VISIT NEEDED FOR ADDITIONAL REFILLS 11/29/20   [name], MD  omeprazole (PRILOSEC) 20 MG capsule Take 1 capsule (20 mg total) by mouth 2 (two) times daily before a meal. 02/23/20   [name], MD  oxyCODONE-acetaminophen (PERCOCET) 10-325 MG tablet Take 1 tablet by mouth 5 (five) times daily as needed for pain. 02/09/19   [provider]  OZEMPIC, 1 MG/DOSE, 2 MG/1.5ML SOPN Inject 1 mg into the skin every Wednesday.  01/21/19   [provider]  predniSONE (DELTASONE) 10 MG tablet Take 10mg  daily for 2 weeks 01/23/21   Baird Lyons D, MD  pregabalin (LYRICA) 75 MG capsule Take 75 mg by mouth 2 (two) times daily. 02/09/19   [provider]  Vitamin D, Ergocalciferol, (DRISDOL) 1.25 MG (50000 UT) CAPS capsule TAKE 1 CAPSULE BY MOUTH 1 TIME A WEEK Patient taking differently: Take  50,000 Units by mouth every Wednesday. 11/23/18   [name], MD  VYVANSE 70 MG capsule Take 70 mg by mouth daily. 01/20/19   [provider]  zolpidem (AMBIEN) 5 MG tablet TAKE 1 TABLET BY MOUTH AT BEDTIME AS NEEDED FOR SLEEP 11/30/20   Young, Clinton D, MD  mometasone-formoterol [facility]) 200-5 MCG/ACT AERO Inhale 2 puffs into the lungs 2 (two) times daily. Dx: Asthma 04/08/19 08/12/19  Deneise Lever, MD    Allergies    Contrast media [iodinated diagnostic agents]  Review of Systems   Review of Systems  Constitutional:  Negative for chills and fever.  Respiratory:  Positive for cough, chest tightness, shortness of breath and wheezing.   Cardiovascular:  Negative for chest pain.  All other systems reviewed and are negative.  Physical Exam Updated Vital Signs BP (!) 193/104   Pulse 95   Temp 98 F (36.7 C) (Oral)   Resp 19   LMP 01/12/2021 (Approximate)   SpO2 100%   Physical Exam Vitals and nursing note reviewed.  Constitutional:      Appearance: She is obese. She is not ill-appearing or diaphoretic.  HENT:     Head: Normocephalic and atraumatic.  Eyes:     Conjunctiva/sclera: Conjunctivae normal.  Cardiovascular:     Rate and Rhythm: Normal rate and regular rhythm.     Pulses: Normal pulses.  Pulmonary:     Breath sounds: Wheezing present. No rhonchi or rales.     Comments: Mild tachypnea however speaking in full sentences. Diffuse end expiratory wheezing throughout.  Abdominal:     Palpations: Abdomen is soft.     Tenderness: There is no abdominal tenderness.  Musculoskeletal:     Cervical back: Neck supple.  Skin:    General: Skin is warm and dry.  Neurological:     Mental Status: She is alert.    ED Results / Procedures / Treatments   Labs (all labs ordered are listed, but only abnormal results are displayed) Labs Reviewed  RESP PANEL BY RT-PCR (FLU A&B, COVID) ARPGX2 - Abnormal; Notable for the following components:      Result Value   SARS  Coronavirus 2 by RT PCR POSITIVE (*)    All other components within normal limits  BASIC METABOLIC PANEL - Abnormal; Notable for the following components:   Glucose, Bld 151 (*)    All other components within normal limits  CBC WITH DIFFERENTIAL/PLATELET - Abnormal; Notable for the following components:   WBC 20.3 (*)    Hemoglobin 11.6 (*)    MCH 25.7 (*)    RDW 18.0 (*)    Platelets 464 (*)    Neutro Abs 14.3 (*)    Lymphs Abs 4.6 (*)    Abs Immature Granulocytes 0.45 (*)    All other components within normal limits  HCG, QUANTITATIVE, PREGNANCY  I-STAT BETA HCG BLOOD, ED ([facility], [facility], [facility] ONLY)    EKG None  Radiology DG Chest Port 1 View  Result Date: 01/25/2021 CLINICAL DATA:  Asthma exacerbation.  Shortness of breath. EXAM: PORTABLE CHEST 1 VIEW COMPARISON:  08/12/2020 FINDINGS: The cardiac silhouette, mediastinal and hilar contours are within normal limits and stable. Chronic emphysematous and bronchitic type lung changes but no definite infiltrates or effusions. IMPRESSION: Chronic lung changes but no acute pulmonary findings. Electronically Signed   By: Marijo Sanes M.D.   On: 01/25/2021 18:36    Procedures Procedures   Medications Ordered in ED Medications  magnesium sulfate IVPB 2 g 50 mL (0 g Intravenous Stopped 01/25/21 2027)  albuterol (PROVENTIL) (2.5 MG/3ML) 0.083% nebulizer solution (7.5 mg/hr Nebulization Given 01/25/21 2030)    ED Course  I have reviewed the triage vital signs and the nursing notes.  Pertinent labs & imaging results that were available during my care of the patient were reviewed by me and considered in my medical decision making (see chart for details).    MDM Rules/Calculators/A&P                           50 year old female who presents to the ED today via EMS from pulmonology office with asthma exacerbation.  Symptoms ongoing for 2 weeks.  Arrival to the ED patient is on her second DuoNeb treatment from EMS.  She also received 125 mg IV  Solu-Medrol.  Has been taking 20 mg of prednisone daily for the past 2 days as well.  She has normal O2 sats currently.  She is mildly tachypneic however speaking in full sentences.  Has diffuse wheezing throughout.  We will plan to work-up for asthma exacerbation at this time.  It does appear that pulmonology thought that she would likely require admission as she has required admission in the past.  She has also had to be intubated in the past, protecting her airway currently I do not feel this is a requirement at this time.  Plan for mag and continuous breathing treatment x1 hour.  Chest x-ray with chronic lung changes, no acute findings CBC with a leukocytosis of 20,300.  Patient is on  his own I suspect this is likely the cause of her elevation.  BMP with glucose 151.  Creatinine stable at 0.73.  She received mag, and states she feels the same.  Continuous neb pending at this time  COVID test has returned positive per lab who I called.  She is currently receiving her continuous neb.  We will plan to reevaluate afterwards.  Patient is asking for sandwich.  She  seems very comfortable.  I do not feel she would require admission.  She has been sick for 2 weeks and therefore is out of the window for any oral antiviral therapy.   Pt reevaluated after continuous neb. Complaining of chest congestion mostly  however comfortable and maintaining sats at this time. I had shared decision making with pt regarding admission vs discharge home. Pt feels comfortable going home with cough medication. Will also increase her steroids as she feels 10 mg is not cutting it. Pt instructed to follow up with PCP and pulmonology and to return PRN for worsening symptoms. She is in agreement with plan. Have discussed case with attending physician Dr. Ronnald Nian who agrees with plan.   This note was prepared using Dragon voice recognition software and may include unintentional dictation errors due to the inherent limitations of  voice recognition software.   April [name] was evaluated in Emergency Department on 01/25/2021 for the symptoms described in the history of present illness. She was evaluated in the context of the global COVID-19 pandemic, which necessitated consideration that the patient might be at risk for infection with the SARS-CoV-2 virus that causes COVID-19. Institutional protocols and algorithms that pertain to the evaluation of patients at risk for COVID-19 are in a state of rapid change based on information released by regulatory bodies including the CDC and federal and state organizations. These policies and algorithms were followed during the patient's care in the ED.   Final Clinical Impression(s) / ED Diagnoses Final diagnoses:  COVID-19  Moderate persistent asthma with exacerbation    Rx / DC Orders ED Discharge Orders          Ordered    benzonatate (TESSALON) 100 MG capsule  Every 8 hours        01/25/21 2202    predniSONE (DELTASONE) 20 MG tablet  Daily        01/25/21 2202             Discharge Instructions      Please pick up medication and take as prescribed  Follow up with your PCP regarding ED visit today and COVID 19 positive status  Given  you began having symptoms 2 weeks ago you are likely out of the window for quarantine.   Return to the ED for any new/worsening symptoms       Eustaquio Maize, Hershal Coria 01/25/21 [place], [place], DO 01/25/21 2348

## 2021-01-25 NOTE — Telephone Encounter (Signed)
Spoke with pt and she stated that she has been having an asthma flare x 2 weeks.  The flare comes and goes.  CY told her to take the prednisone 10 mg daily but yesterday she started taking 2 tablets but this has not helped her.  She stated that she would like to see CY so she is scheduled today at 330 to see him. Nothing further is needed.

## 2021-01-25 NOTE — Discharge Instructions (Addendum)
Please pick up medication and take as prescribed  Follow up with your PCP regarding ED visit today and COVID 19 positive status  Given  you began having symptoms 2 weeks ago you are likely out of the window for quarantine.   Return to the ED for any new/worsening symptoms

## 2021-01-25 NOTE — Assessment & Plan Note (Signed)
Obesity with OHS actively complicating asthma exacerbation.

## 2021-01-25 NOTE — Patient Instructions (Signed)
Refer to ED by ambulance, anticipating admission for status asthmaticus Home Covid test 2 days ago.

## 2021-01-26 ENCOUNTER — Telehealth: Payer: Self-pay | Admitting: Internal Medicine

## 2021-01-26 MED ORDER — ZOLPIDEM TARTRATE 10 MG PO TABS: ORAL_TABLET | ORAL | 5 refills | Status: DC

## 2021-01-26 NOTE — Telephone Encounter (Signed)
Patient made aware.   Nothing further needed at this time.

## 2021-01-26 NOTE — Telephone Encounter (Signed)
Called and spoke with pt and she is aware that the Lorrin Mais was sent in for the 10 mg.  Nothing further is needed.

## 2021-01-26 NOTE — Telephone Encounter (Signed)
Call returned to patient, confirmed DOB. She is requesting to increase her Ambien to 10 mg. 5 mg has not been helping. She has been doubling her dose and she has been sleeping at least 6-8 hours a night. Pharmacy [facility] on [address].   She reports she did not stay at the hospital but that she was at the end of covid. She reports increased wheezing and congestion. She was given magnesium, prednisone, and nebulizer treatment at hospital and she felt better after that.   CY please advise. Thanks :)

## 2021-01-26 NOTE — Telephone Encounter (Signed)
Ambien changed to 10 mg and refilled at [facility]

## 2021-01-29 ENCOUNTER — Telehealth: Payer: Self-pay | Admitting: Internal Medicine

## 2021-01-29 NOTE — Telephone Encounter (Signed)
CY please advise.   Pt stated that she is in the process of moving and her April [name] got misplaced    she is wanting to see if you can send in another Rx to the pharmacy. Please advise. Thanks

## 2021-01-30 NOTE — Telephone Encounter (Signed)
Sorry. She will have to use something over the counter until her next refill.

## 2021-01-30 NOTE — Telephone Encounter (Signed)
Called and spoke with pt and she is aware of CY recs.  Nothing further is needed.  

## 2021-02-01 ENCOUNTER — Ambulatory Visit: Payer: Medicare Other

## 2021-02-03 ENCOUNTER — Other Ambulatory Visit: Payer: Self-pay

## 2021-02-03 ENCOUNTER — Emergency Department (HOSPITAL_COMMUNITY): Payer: Medicare Other

## 2021-02-03 ENCOUNTER — Inpatient Hospital Stay (HOSPITAL_COMMUNITY)
Admission: EM | Admit: 2021-02-03 | Discharge: 2021-02-07 | DRG: 189 | Disposition: A | Discharge status: Home / Self Care | Payer: Medicare Other | Attending: Internal Medicine | Admitting: Internal Medicine

## 2021-02-03 DIAGNOSIS — I10 Essential (primary) hypertension: Secondary | ICD-10-CM | POA: Diagnosis present

## 2021-02-03 DIAGNOSIS — F419 Anxiety disorder, unspecified: Secondary | ICD-10-CM | POA: Diagnosis not present

## 2021-02-03 DIAGNOSIS — G4700 Insomnia, unspecified: Secondary | ICD-10-CM | POA: Diagnosis present

## 2021-02-03 DIAGNOSIS — Z8616 Personal history of COVID-19: Secondary | ICD-10-CM

## 2021-02-03 DIAGNOSIS — J441 Chronic obstructive pulmonary disease with (acute) exacerbation: Secondary | ICD-10-CM | POA: Diagnosis present

## 2021-02-03 DIAGNOSIS — Z23 Encounter for immunization: Secondary | ICD-10-CM

## 2021-02-03 DIAGNOSIS — M47816 Spondylosis without myelopathy or radiculopathy, lumbar region: Secondary | ICD-10-CM | POA: Diagnosis present

## 2021-02-03 DIAGNOSIS — F17200 Nicotine dependence, unspecified, uncomplicated: Secondary | ICD-10-CM | POA: Diagnosis present

## 2021-02-03 DIAGNOSIS — F411 Generalized anxiety disorder: Secondary | ICD-10-CM | POA: Diagnosis present

## 2021-02-03 DIAGNOSIS — L83 Acanthosis nigricans: Secondary | ICD-10-CM | POA: Diagnosis present

## 2021-02-03 DIAGNOSIS — J302 Other seasonal allergic rhinitis: Secondary | ICD-10-CM | POA: Diagnosis present

## 2021-02-03 DIAGNOSIS — M069 Rheumatoid arthritis, unspecified: Secondary | ICD-10-CM | POA: Diagnosis present

## 2021-02-03 DIAGNOSIS — R0602 Shortness of breath: Secondary | ICD-10-CM

## 2021-02-03 DIAGNOSIS — Z9851 Tubal ligation status: Secondary | ICD-10-CM

## 2021-02-03 DIAGNOSIS — I480 Paroxysmal atrial fibrillation: Secondary | ICD-10-CM | POA: Diagnosis not present

## 2021-02-03 DIAGNOSIS — Z91041 Radiographic dye allergy status: Secondary | ICD-10-CM

## 2021-02-03 DIAGNOSIS — J9601 Acute respiratory failure with hypoxia: Secondary | ICD-10-CM | POA: Diagnosis not present

## 2021-02-03 DIAGNOSIS — I5032 Chronic diastolic (congestive) heart failure: Secondary | ICD-10-CM | POA: Diagnosis present

## 2021-02-03 DIAGNOSIS — J4489 Other specified chronic obstructive pulmonary disease: Secondary | ICD-10-CM | POA: Diagnosis present

## 2021-02-03 DIAGNOSIS — Z79899 Other long term (current) drug therapy: Secondary | ICD-10-CM

## 2021-02-03 DIAGNOSIS — J4551 Severe persistent asthma with (acute) exacerbation: Secondary | ICD-10-CM | POA: Diagnosis present

## 2021-02-03 DIAGNOSIS — G8929 Other chronic pain: Secondary | ICD-10-CM | POA: Diagnosis present

## 2021-02-03 DIAGNOSIS — Z8249 Family history of ischemic heart disease and other diseases of the circulatory system: Secondary | ICD-10-CM

## 2021-02-03 DIAGNOSIS — T380X5A Adverse effect of glucocorticoids and synthetic analogues, initial encounter: Secondary | ICD-10-CM | POA: Diagnosis present

## 2021-02-03 DIAGNOSIS — Z7951 Long term (current) use of inhaled steroids: Secondary | ICD-10-CM

## 2021-02-03 DIAGNOSIS — F32A Depression, unspecified: Secondary | ICD-10-CM | POA: Diagnosis present

## 2021-02-03 DIAGNOSIS — G4733 Obstructive sleep apnea (adult) (pediatric): Secondary | ICD-10-CM | POA: Diagnosis present

## 2021-02-03 DIAGNOSIS — E559 Vitamin D deficiency, unspecified: Secondary | ICD-10-CM | POA: Diagnosis present

## 2021-02-03 DIAGNOSIS — M179 Osteoarthritis of knee, unspecified: Secondary | ICD-10-CM | POA: Diagnosis present

## 2021-02-03 DIAGNOSIS — K219 Gastro-esophageal reflux disease without esophagitis: Secondary | ICD-10-CM | POA: Diagnosis present

## 2021-02-03 DIAGNOSIS — J449 Chronic obstructive pulmonary disease, unspecified: Secondary | ICD-10-CM | POA: Diagnosis present

## 2021-02-03 DIAGNOSIS — R52 Pain, unspecified: Secondary | ICD-10-CM

## 2021-02-03 DIAGNOSIS — I110 Hypertensive heart disease with heart failure: Secondary | ICD-10-CM | POA: Diagnosis present

## 2021-02-03 DIAGNOSIS — Z825 Family history of asthma and other chronic lower respiratory diseases: Secondary | ICD-10-CM

## 2021-02-03 DIAGNOSIS — Z6841 Body Mass Index (BMI) 40.0 and over, adult: Secondary | ICD-10-CM

## 2021-02-03 DIAGNOSIS — Z8701 Personal history of pneumonia (recurrent): Secondary | ICD-10-CM

## 2021-02-03 DIAGNOSIS — E785 Hyperlipidemia, unspecified: Secondary | ICD-10-CM | POA: Diagnosis present

## 2021-02-03 DIAGNOSIS — Z20822 Contact with and (suspected) exposure to covid-19: Secondary | ICD-10-CM | POA: Diagnosis present

## 2021-02-03 DIAGNOSIS — U071 COVID-19: Secondary | ICD-10-CM

## 2021-02-03 LAB — LACTIC ACID, PLASMA: Lactic Acid, Venous: 0.9 mmol/L (ref 0.5–1.9)

## 2021-02-03 LAB — BASIC METABOLIC PANEL
Anion gap: 7 (ref 5–15)
BUN: 6 mg/dL (ref 6–20)
CO2: 24 mmol/L (ref 22–32)
Calcium: 8.1 mg/dL — ABNORMAL LOW (ref 8.9–10.3)
Chloride: 106 mmol/L (ref 98–111)
Creatinine, Ser: 0.71 mg/dL (ref 0.44–1.00)
GFR, Estimated: >60 mL/min (ref >60)
Glucose, Bld: 137 mg/dL — ABNORMAL HIGH (ref 70–99)
Potassium: 3.9 mmol/L (ref 3.5–5.1)
Sodium: 137 mmol/L (ref 135–145)

## 2021-02-03 LAB — CBC WITH DIFFERENTIAL/PLATELET
Abs Immature Granulocytes: 0.06 10*3/uL (ref 0.00–0.07)
Basophils Absolute: 0 10*3/uL (ref 0.0–0.1)
Basophils Relative: 0 %
Eosinophils Absolute: 0.3 10*3/uL (ref 0.0–0.5)
Eosinophils Relative: 2 %
HCT: 38.4 % (ref 36.0–46.0)
Hemoglobin: 11.8 g/dL — ABNORMAL LOW (ref 12.0–15.0)
Immature Granulocytes: 0 %
Lymphocytes Relative: 27 %
Lymphs Abs: 3.7 10*3/uL (ref 0.7–4.0)
MCH: 25.8 pg — ABNORMAL LOW (ref 26.0–34.0)
MCHC: 30.7 g/dL (ref 30.0–36.0)
MCV: 83.8 fL (ref 80.0–100.0)
Monocytes Absolute: 1 10*3/uL (ref 0.1–1.0)
Monocytes Relative: 7 %
Neutro Abs: 8.6 10*3/uL — ABNORMAL HIGH (ref 1.7–7.7)
Neutrophils Relative %: 64 %
Platelets: 352 10*3/uL (ref 150–400)
RBC: 4.58 MIL/uL (ref 3.87–5.11)
RDW: 17.4 % — ABNORMAL HIGH (ref 11.5–15.5)
WBC: 13.7 10*3/uL — ABNORMAL HIGH (ref 4.0–10.5)
nRBC: 0 % (ref 0.0–0.2)

## 2021-02-03 LAB — LACTATE DEHYDROGENASE: LDH: 241 U/L — ABNORMAL HIGH (ref 98–192)

## 2021-02-03 LAB — D-DIMER, QUANTITATIVE: D-Dimer, Quant: 0.47 ug/mL-FEU (ref 0.00–0.50)

## 2021-02-03 LAB — FIBRINOGEN: Fibrinogen: 475 mg/dL (ref 210–475)

## 2021-02-03 LAB — PROCALCITONIN: Procalcitonin: <0.1 ng/mL

## 2021-02-03 LAB — TROPONIN I (HIGH SENSITIVITY)
Troponin I (High Sensitivity): 4 ng/L (ref <18)
Troponin I (High Sensitivity): 4 ng/L (ref <18)

## 2021-02-03 LAB — TRIGLYCERIDES: Triglycerides: 101 mg/dL (ref <150)

## 2021-02-03 MED ORDER — POLYETHYLENE GLYCOL 3350 17 G PO PACK: 17.0000 g | PACK | Freq: Every day | ORAL | Status: DC | PRN

## 2021-02-03 MED ORDER — ENOXAPARIN SODIUM 40 MG/0.4ML IJ SOSY
40.0000 mg | PREFILLED_SYRINGE | Freq: Every day | INTRAMUSCULAR | Status: DC
  Administered 2021-02-04: 40 mg via SUBCUTANEOUS
  Filled 2021-02-03: qty 0.4

## 2021-02-03 MED ORDER — ALBUTEROL SULFATE (2.5 MG/3ML) 0.083% IN NEBU: 2.5000 mg | INHALATION_SOLUTION | Freq: Four times a day (QID) | RESPIRATORY_TRACT | Status: DC | PRN

## 2021-02-03 MED ORDER — FUROSEMIDE 40 MG PO TABS
40.0000 mg | ORAL_TABLET | Freq: Every day | ORAL | Status: DC
  Administered 2021-02-04 – 2021-02-06 (×3): 40 mg via ORAL
  Filled 2021-02-03 (×2): qty 2
  Filled 2021-02-03: qty 1

## 2021-02-03 MED ORDER — DEXAMETHASONE SODIUM PHOSPHATE 10 MG/ML IJ SOLN
8.0000 mg | Freq: Two times a day (BID) | INTRAMUSCULAR | Status: DC
  Administered 2021-02-04: 8 mg via INTRAVENOUS
  Filled 2021-02-03: qty 1

## 2021-02-03 MED ORDER — LEVOTHYROXINE SODIUM 25 MCG PO TABS: 50.0000 ug | ORAL_TABLET | Freq: Every day | ORAL | Status: DC

## 2021-02-03 MED ORDER — METHYLPREDNISOLONE SODIUM SUCC 125 MG IJ SOLR
125.0000 mg | Freq: Once | INTRAMUSCULAR | Status: AC
  Administered 2021-02-03: 125 mg via INTRAVENOUS
  Filled 2021-02-03: qty 2

## 2021-02-03 MED ORDER — FLUTICASONE-UMECLIDIN-VILANT 100-62.5-25 MCG/INH IN AEPB: 1.0000 | INHALATION_SPRAY | Freq: Every day | RESPIRATORY_TRACT | Status: DC

## 2021-02-03 MED ORDER — ALBUTEROL SULFATE (2.5 MG/3ML) 0.083% IN NEBU: 2.5000 mg | INHALATION_SOLUTION | RESPIRATORY_TRACT | Status: DC | PRN

## 2021-02-03 MED ORDER — SODIUM CHLORIDE 0.9% FLUSH
3.0000 mL | Freq: Two times a day (BID) | INTRAVENOUS | Status: DC
  Administered 2021-02-04 – 2021-02-07 (×6): 3 mL via INTRAVENOUS

## 2021-02-03 MED ORDER — MAGNESIUM SULFATE 2 GM/50ML IV SOLN
2.0000 g | Freq: Once | INTRAVENOUS | Status: AC
  Administered 2021-02-03: 2 g via INTRAVENOUS
  Filled 2021-02-03: qty 50

## 2021-02-03 MED ORDER — ZOLPIDEM TARTRATE 5 MG PO TABS
5.0000 mg | ORAL_TABLET | Freq: Every evening | ORAL | Status: DC | PRN
  Administered 2021-02-04: 5 mg via ORAL
  Filled 2021-02-03: qty 1

## 2021-02-03 MED ORDER — ALBUTEROL SULFATE (2.5 MG/3ML) 0.083% IN NEBU
10.0000 mg/h | INHALATION_SOLUTION | RESPIRATORY_TRACT | Status: DC
  Administered 2021-02-03: 10 mg/h via RESPIRATORY_TRACT
  Filled 2021-02-03: qty 12
  Filled 2021-02-03: qty 3

## 2021-02-03 MED ORDER — CLONIDINE HCL 0.2 MG PO TABS
0.3000 mg | ORAL_TABLET | Freq: Every day | ORAL | Status: DC
  Administered 2021-02-04: 0.3 mg via ORAL
  Filled 2021-02-03: qty 1

## 2021-02-03 MED ORDER — IPRATROPIUM-ALBUTEROL 0.5-2.5 (3) MG/3ML IN SOLN
3.0000 mL | Freq: Four times a day (QID) | RESPIRATORY_TRACT | Status: DC
  Filled 2021-02-03: qty 3

## 2021-02-03 MED ORDER — IPRATROPIUM-ALBUTEROL 0.5-2.5 (3) MG/3ML IN SOLN
3.0000 mL | Freq: Once | RESPIRATORY_TRACT | Status: DC
  Administered 2021-02-03: 3 mL via RESPIRATORY_TRACT
  Filled 2021-02-03: qty 3

## 2021-02-03 MED ORDER — METOPROLOL TARTRATE 25 MG PO TABS
25.0000 mg | ORAL_TABLET | Freq: Two times a day (BID) | ORAL | Status: DC
  Administered 2021-02-04 (×2): 25 mg via ORAL
  Filled 2021-02-03 (×2): qty 1

## 2021-02-03 MED ORDER — PREDNISONE 20 MG PO TABS: 40.0000 mg | ORAL_TABLET | Freq: Every day | ORAL | Status: DC

## 2021-02-03 MED ORDER — DULOXETINE HCL 60 MG PO CPEP
60.0000 mg | ORAL_CAPSULE | Freq: Every day | ORAL | Status: DC
  Administered 2021-02-04 – 2021-02-07 (×4): 60 mg via ORAL
  Filled 2021-02-03 (×4): qty 1

## 2021-02-03 MED ORDER — MONTELUKAST SODIUM 10 MG PO TABS
10.0000 mg | ORAL_TABLET | Freq: Every day | ORAL | Status: DC
  Administered 2021-02-04 – 2021-02-06 (×3): 10 mg via ORAL
  Filled 2021-02-03 (×6): qty 1

## 2021-02-03 MED ORDER — ACETAMINOPHEN 325 MG PO TABS
650.0000 mg | ORAL_TABLET | Freq: Four times a day (QID) | ORAL | Status: DC | PRN
  Administered 2021-02-04: 650 mg via ORAL
  Filled 2021-02-03: qty 2

## 2021-02-03 MED ORDER — HYDRALAZINE HCL 10 MG PO TABS
10.0000 mg | ORAL_TABLET | Freq: Three times a day (TID) | ORAL | Status: DC
  Administered 2021-02-04 (×2): 10 mg via ORAL
  Filled 2021-02-03 (×2): qty 1

## 2021-02-03 MED ORDER — PREGABALIN 100 MG PO CAPS
300.0000 mg | ORAL_CAPSULE | Freq: Two times a day (BID) | ORAL | Status: DC
  Administered 2021-02-04 – 2021-02-07 (×8): 300 mg via ORAL
  Filled 2021-02-03 (×3): qty 3
  Filled 2021-02-03: qty 6
  Filled 2021-02-03 (×4): qty 3

## 2021-02-03 MED ORDER — ACETAMINOPHEN 650 MG RE SUPP: 650.0000 mg | Freq: Four times a day (QID) | RECTAL | Status: DC | PRN

## 2021-02-03 MED ORDER — EPINEPHRINE PF 1 MG/ML IJ SOLN
0.3000 mg | INTRAMUSCULAR | Status: DC | PRN
  Filled 2021-02-03: qty 1

## 2021-02-03 MED ORDER — CLONAZEPAM 0.5 MG PO TABS
0.5000 mg | ORAL_TABLET | Freq: Every evening | ORAL | Status: DC | PRN
  Administered 2021-02-04: 0.5 mg via ORAL
  Filled 2021-02-03: qty 1

## 2021-02-03 NOTE — H&P (Signed)
History and Physical   April [name] IFO:[id number] DOB: 01/30/1971 DOA: 02/03/2021  PCP: [name], MD   Patient coming from: Home  Chief Complaint: Shortness of breath  HPI: April [name] is a 50 y.o. female with medical history significant of asthma, COPD with recurrent exacerbation, atrial fibrillation, anxiety, depression, CHF, GERD, hypertension, OSA, lumbar arthritis who presents with ongoing shortness of breath.  Patient was diagnosed with COVID 9 days ago after 2 weeks of symptoms.  Due to the length of her symptoms she has not received any antivirals.  She also has history of significant asthma and COPD with severe exacerbations in the past some requiring intubation.  She has had worsening shortness of breath and wheezing for several days and was placed on 3 L in route with improvement in saturations.  She denies fevers, chills, chest pain, abdominal pain, constipation, diarrhea, nausea, vomiting.  ED Course: Vital signs in the ED significant for requiring 4L to maintain saturations and blood pressure in the 676H to 209O systolic.  Respiratory rate intermittently in the low to mid 20s.  Lab work-up showed CMP with glucose 137, calcium 8.1.  CBC with leukocytosis 13.7 hemoglobin stable 11.8.  Troponin negative x2 and lactic acid negative.  Blood cultures ordered and are pending.  Chest x-ray showed no acute normality.  Inflammatory markers ordered due to history of COVID included D-dimer which was normal, procalcitonin which was less than 0.1, LDH which was 241, triglycerides which were normal and fibrinogen which was 475.  Patient received Solu-Medrol, DuoNeb, albuterol, magnesium in the ED.  Review of Systems: As per HPI otherwise all other systems reviewed and are negative.  Past Medical History:  Diagnosis Date   Acanthosis nigricans    Anxiety    Arthritis    "knees" (04/28/2017)   Asthma    Followed by Dr. Annamaria Boots (pulmonology); receives every other week omalizumab  injections; has frequent exacerbations   Back pain    Chronic diastolic CHF (congestive heart failure) ([place]) 01/17/2017   COPD (chronic obstructive pulmonary disease) ([place])    PFTs in 2002, FEV1/FVC 65, no post bronchodilater test done   Depression    GERD (gastroesophageal reflux disease)    Headache(784.0)    "q couple days" (70/96/2836)   Helicobacter pylori (H. pylori) infection    Hypertension, essential    Insomnia    Joint pain    Lower extremity edema    Menorrhagia    Morbid obesity ([place])    OSA on CPAP    Sleep study 2008 - mild OSA, not enough events to titrate CPAP; wears CPAP now/pt on 04/28/2017   Pneumonia X 1   Prediabetes    Rheumatoid arthritis (HCC)    Seasonal allergies    Shortness of breath    Tobacco user    Vitamin D deficiency     Past Surgical History:  Procedure Laterality Date   CARDIOVERSION N/A 05/30/2017   Procedure: CARDIOVERSION;  Surgeon: Sanda Klein, MD;  Location: [facility];  Service: Cardiovascular;  Laterality: N/A;   REDUCTION MAMMAPLASTY Bilateral 09/2011   TUBAL LIGATION  1996   bilateral    Social History  reports that she quit smoking about 6 years ago. Her smoking use included cigarettes. She has a 13.00 pack-year smoking history. She has never used smokeless tobacco. She reports that she does not drink alcohol and does not use drugs.  Allergies  Allergen Reactions   Contrast Media [Iodinated Diagnostic Agents] Itching    Ct contrast  Family History  Problem Relation Age of Onset   Hypertension Mother    Asthma Daughter    Cancer Paternal Aunt    Asthma Maternal Grandmother   Reviewed on admission  Prior to Admission medications   Medication Sig Start Date End Date Taking? Authorizing Provider  albuterol (PROAIR HFA) 108 (90 Base) MCG/ACT inhaler Inhale 2 puffs every 6 hourss if needed- rescue 05/30/20   Baird Lyons D, MD  benzonatate (TESSALON) 100 MG capsule Take 1 capsule (100 mg total) by mouth every 8  (eight) hours. 01/25/21   Eustaquio Maize, PA-C  clonazePAM (KLONOPIN) 1 MG tablet 1 tab at bedtime for sleep as needed 10/12/20   Baird Lyons D, MD  cloNIDine (CATAPRES) 0.3 MG tablet Take 1 tablet (0.3 mg total) by mouth at bedtime. OFFICE VISIT NEEDED FOR ADDITIONAL REFILLS 11/29/20   [name], MD  DULoxetine (CYMBALTA) 60 MG capsule TAKE 1 CAPSULE(60 MG) BY MOUTH DAILY 09/08/20   Newlin, Charlane Ferretti, MD  dupilumab (DUPIXENT) 300 MG/2ML prefilled syringe INJECT 300 MG INTO THE SKIN EVERY 14 (FOURTEEN) DAYS. 01/22/21 01/22/22  Baird Lyons D, MD  EPINEPHrine 0.3 mg/0.3 mL IJ SOAJ injection Inject 0.3 mg into the muscle as needed for anaphylaxis. 05/30/20   Deneise Lever, MD  fluticasone (FLONASE) 50 MCG/ACT nasal spray Place 1 spray into both nostrils daily. 08/12/18   [name], MD  Fluticasone-Umeclidin-Vilant (TRELEGY ELLIPTA) 100-62.5-25 MCG/INH AEPB Inhale 1 puff into the lungs daily. 01/23/21   Deneise Lever, MD  furosemide (LASIX) 40 MG tablet Take 1 tablet (40 mg total) by mouth daily. 02/23/20   [name], MD  hydrALAZINE (APRESOLINE) 10 MG tablet Take 1 tablet (10 mg total) by mouth every 8 (eight) hours for 30 days. 08/04/18 10/26/27  [name], NP  ipratropium-albuterol (DUONEB) 0.5-2.5 (3) MG/3ML SOLN Nebulize 1 ampule every 6 hours if needed 08/21/20   Baird Lyons D, MD  metoprolol tartrate (LOPRESSOR) 25 MG tablet Take 1 tablet (25 mg total) by mouth 2 (two) times daily. Dx: HTN 02/23/20   [name], MD  montelukast (SINGULAIR) 10 MG tablet Take 1 tablet (10 mg total) by mouth at bedtime. OFFICE VISIT NEEDED FOR ADDITIONAL REFILLS 11/29/20   [name], MD  omeprazole (PRILOSEC) 20 MG capsule Take 1 capsule (20 mg total) by mouth 2 (two) times daily before a meal. 02/23/20   [name], MD  oxyCODONE-acetaminophen (PERCOCET) 10-325 MG tablet Take 1 tablet by mouth 5 (five) times daily as needed for pain. 02/09/19   [provider]  OZEMPIC, 1  MG/DOSE, 2 MG/1.5ML SOPN Inject 1 mg into the skin every Wednesday.  01/21/19   [provider]  predniSONE (DELTASONE) 10 MG tablet Take 10mg  daily for 2 weeks 01/23/21   Baird Lyons D, MD  pregabalin (LYRICA) 75 MG capsule Take 75 mg by mouth 2 (two) times daily. 02/09/19   [provider]  Vitamin D, Ergocalciferol, (DRISDOL) 1.25 MG (50000 UT) CAPS capsule TAKE 1 CAPSULE BY MOUTH 1 TIME A WEEK Patient taking differently: Take 50,000 Units by mouth every Wednesday. 11/23/18   [name], MD  VYVANSE 70 MG capsule Take 70 mg by mouth daily. 01/20/19   [provider]  zolpidem (AMBIEN) 10 MG tablet TAKE 1 TABLET BY MOUTH AT BEDTIME AS NEEDED FOR SLEEP 01/26/21   Young, Clinton D, MD  mometasone-formoterol [facility]) 200-5 MCG/ACT AERO Inhale 2 puffs into the lungs 2 (two) times daily. Dx: Asthma 04/08/19 08/12/19  Baird Lyons D,  MD    Physical Exam: Vitals:   02/03/21 2115 02/03/21 2145 02/03/21 2152 02/03/21 2232  BP: (!) 177/93 (!) 166/99  (!) 133/99  Pulse: 86 87  86  Resp: (!) 25 (!) 24  20  Temp:      TempSrc:      SpO2: 100% 100% 100% 100%  Weight:      Height:       Physical Exam Constitutional:      General: She is not in acute distress.    Appearance: Normal appearance. She is obese.  HENT:     Head: Normocephalic and atraumatic.     Mouth/Throat:     Mouth: Mucous membranes are moist.     Pharynx: Oropharynx is clear.  Eyes:     Extraocular Movements: Extraocular movements intact.     Pupils: Pupils are equal, round, and reactive to light.  Cardiovascular:     Rate and Rhythm: Normal rate and regular rhythm.     Pulses: Normal pulses.     Heart sounds: Normal heart sounds.  Pulmonary:     Effort: Pulmonary effort is normal. No respiratory distress.     Breath sounds: Wheezing present.     Comments: Mild tachypnea Abdominal:     General: Bowel sounds are normal. There is no distension.     Palpations: Abdomen is soft.      Tenderness: There is no abdominal tenderness.  Musculoskeletal:        General: No swelling or deformity.  Skin:    General: Skin is warm and dry.  Neurological:     General: No focal deficit present.     Mental Status: Mental status is at baseline.   Labs on Admission: I have personally reviewed following labs and imaging studies  CBC: Recent Labs  Lab 02/03/21 1956  WBC 13.7*  NEUTROABS 8.6*  HGB 11.8*  HCT 38.4  MCV 83.8  PLT 144    Basic Metabolic Panel: Recent Labs  Lab 02/03/21 1956  NA 137  K 3.9  CL 106  CO2 24  GLUCOSE 137*  BUN 6  CREATININE 0.71  CALCIUM 8.1*    GFR: Estimated Creatinine Clearance: 128.8 mL/min (by C-G formula based on SCr of 0.71 mg/dL).  Liver Function Tests: No results for input(s): AST, ALT, ALKPHOS, BILITOT, PROT, ALBUMIN in the last 168 hours.  Urine analysis:    Component Value Date/Time   COLORURINE YELLOW 03/18/2020 1952   APPEARANCEUR HAZY (A) 03/18/2020 1952   LABSPEC 1.032 (H) 03/18/2020 1952   PHURINE 5.0 03/18/2020 1952   GLUCOSEU NEGATIVE 03/18/2020 1952   GLUCOSEU NEG mg/dL 10/28/2007 2049   HGBUR NEGATIVE 03/18/2020 1952   BILIRUBINUR NEGATIVE 03/18/2020 1952   KETONESUR 5 (A) 03/18/2020 1952   PROTEINUR 30 (A) 03/18/2020 1952   UROBILINOGEN 1.0 11/21/2014 0707   NITRITE NEGATIVE 03/18/2020 1952   LEUKOCYTESUR NEGATIVE 03/18/2020 1952    Radiological Exams on Admission: DG Chest 1 View  Result Date: 02/03/2021 CLINICAL DATA:  Dyspnea, COVID pneumonia EXAM: CHEST  1 VIEW COMPARISON:  01/25/2021 FINDINGS: The heart size and mediastinal contours are within normal limits. Both lungs are clear. The visualized skeletal structures are unremarkable. IMPRESSION: No active disease. Electronically Signed   By: Fidela Salisbury M.D.   On: 02/03/2021 19:36    EKG: Independently reviewed.  Sinus rhythm at 98 bpm  Assessment/Plan Principal Problem:   COPD exacerbation (HCC) Active Problems:   HTN (hypertension)    GERD (gastroesophageal reflux disease)  Anxiety and depression   AF (paroxysmal atrial fibrillation) (HCC)   Acute respiratory failure with hypoxia (HCC)   Obstructive sleep apnea  Asthma/COPD exacerbation History of COVID-19 infection Acute respiratory failure with hypoxia > History of mixed asthma and COPD > Patient presenting with shortness of breath and wheezing with hypoxic respiratory failure requiring 3 to 4 L to maintain saturations. > Was recently diagnosed with COVID-19 9 days ago after 2 weeks of symptoms so she has not received any antivirals.  She is outside the 21-day window however without another proven source for her exacerbation we will continue on precautions for now but will also check full respiratory panel as reinfection is possible. > History of severe exacerbations including requiring intubation > Note that procalcitonin was negative in the ED despite leukocytosis.  Leukocytosis is likely due to steroid administration. - Monitor on telemetry, supplemental oxygen as needed - Decadron twice daily - Scheduled duo nebs - As needed albuterol - Full viral panel - Trend fever curve and white count  Hypertension CHF Atrial fibrillation - Continue home clonidine, hydralazine, metoprolol - Continue home Lasix  Anxiety Depression - Continue home duloxetine and as needed clonazepam  GERD - Continue home PPI  OSA - Not on CPAP  DVT prophylaxis: Lovenox Code Status:   Full  Family Communication:  None on admission  Disposition Plan:   Patient is from:  Home  Anticipated DC to:  Home  Anticipated DC date:  1 to 4 days  Anticipated DC barriers: None  Consults called:  None  Admission status:  Observation, telemetry  Severity of Illness: The appropriate patient status for this patient is OBSERVATION. Observation status is judged to be reasonable and necessary in order to provide the required intensity of service to ensure the patient's safety. The patient's  presenting symptoms, physical exam findings, and initial radiographic and laboratory data in the context of their medical condition is felt to place them at decreased risk for further clinical deterioration. Furthermore, it is anticipated that the patient will be medically stable for discharge from the hospital within 2 midnights of admission. The following factors support the patient status of observation.   " The patient's presenting symptoms include shortness of breath. " The physical exam findings include wheezing, obese, mild tachypnea. " The initial radiographic and laboratory data are   Lab work-up showed CMP with glucose 137, calcium 8.1.  CBC with leukocytosis 13.7 hemoglobin stable 11.8.  Troponin negative x2 and lactic acid negative.  Blood cultures ordered and are pending.  Chest x-ray showed no acute normality.  Inflammatory markers ordered due to history of COVID included D-dimer which was normal, procalcitonin which was less than 0.1, LDH which was 241, triglycerides which were normal and fibrinogen which was 475.    Marcelyn Bruins MD [facility]  How to contact the [facility] Attending or Consulting provider [place] or covering provider during after hours [place], for this patient?   Check the care team in [facility] and look for a) attending/consulting [facility] provider listed and b) the [facility] team listed Log into [email or web address] and use 's universal password to access. If you do not have the password, please contact the hospital operator. Locate the [facility] provider you are looking for under [facility] and page to a number that you can be directly reached. If you still have difficulty reaching the provider, please page the [facility] (Director on Call) for the Hospitalists listed on amion for assistance.  02/03/2021, 11:08 PM

## 2021-02-03 NOTE — ED Notes (Signed)
Pt given sandwich bag and ginger ale.  

## 2021-02-03 NOTE — ED Provider Notes (Signed)
Emergency Medicine Provider Triage Evaluation Note  April [name] , a 50 y.o. female  was evaluated in triage.  Pt complains of sob. Diffuse inspiratory and expiratory wheeze, 1-2 words sentences. Did not get steroids with EMS. Recent COVID positive. Does not wear oxygen at home, Req supplemental here  Review of Systems  Positive: Sob, wheeze Negative: Fever, emesis  Physical Exam  LMP 01/12/2021 (Approximate)  Gen:   Awake Resp:  Diffuse inspiratory and expiratory wheeze, Speaks in short sentences with difficulty MSK:   Moves extremities without difficulty  Other:    Medical Decision Making  Medically screening exam initiated at 7:01 PM.  Appropriate orders placed.  April [name] was informed that the remainder of the evaluation will be completed by another provider, this initial triage assessment does not replace that evaluation, and the importance of remaining in the ED until their evaluation is complete.    Patient with increased work of breathing, diffuse inspiratory and expiratory wheeze, hypoxic on room air with EMS.  Recent COVID-positive    Nursing notified patient needs room in back   Azka Steger A, PA-C 02/03/21 1907    Daleen Bo, MD 02/04/21 [phone]

## 2021-02-03 NOTE — ED Provider Notes (Signed)
[facility] EMERGENCY DEPARTMENT Provider Note   CSN: [id number] Arrival date & time: 02/03/21  1902     History Chief Complaint  Patient presents with   Shortness of Breath    April [name] is a 50 y.o. female.  Pt presents to the ED with sob and cough.  She has a hx of copd and asthma.  She was diagnosed with Covid on 9/29.  She was vaccinated + 1 booster.  Pt said she has been having increased sob at home.  She has been using her inhalers without improvement.  Pt called EMS and her O2 sat was low.  Pt placed on 3L.  Pt denies any fevers.  Pt had been sick for 2 weeks when diagnosed, so she did not get any antiviral treatment.        Past Medical History:  Diagnosis Date   Acanthosis nigricans    Anxiety    Arthritis    "knees" (04/28/2017)   Asthma    Followed by Dr. Annamaria Boots (pulmonology); receives every other week omalizumab injections; has frequent exacerbations   Back pain    Chronic diastolic CHF (congestive heart failure) ([place]) 01/17/2017   COPD (chronic obstructive pulmonary disease) ([place])    PFTs in 2002, FEV1/FVC 65, no post bronchodilater test done   Depression    GERD (gastroesophageal reflux disease)    Headache(784.0)    "q couple days" (40/81/4481)   Helicobacter pylori (H. pylori) infection    Hypertension, essential    Insomnia    Joint pain    Lower extremity edema    Menorrhagia    Morbid obesity ([place])    OSA on CPAP    Sleep study 2008 - mild OSA, not enough events to titrate CPAP; wears CPAP now/pt on 04/28/2017   Pneumonia X 1   Prediabetes    Rheumatoid arthritis (HCC)    Seasonal allergies    Shortness of breath    Tobacco user    Vitamin D deficiency     Patient Active Problem List   Diagnosis Date Noted   Severe persistent asthma 07/04/2020   Acute respiratory failure ([place]) 02/20/2019   CHF exacerbation ([place]) 02/20/2019   Hot flashes    COPD exacerbation ([place]) 10/26/2018   Acute respiratory distress 08/03/2018    Acute exacerbation of chronic obstructive pulmonary disease (COPD) ([place]) 08/03/2018   Vitamin D deficiency 05/26/2018   Acute asthma exacerbation 05/16/2018   Urinary incontinence 11/17/2017   Nausea with vomiting 11/06/2017   Hypomagnesemia 10/27/2017   Dysphagia 10/27/2017   Physical deconditioning 10/25/2017   Acute maxillary sinusitis 10/24/2017   Emesis, persistent 10/08/2017   Volume depletion 10/08/2017   Pleuritic chest pain 10/08/2017   SOB (shortness of breath) 10/08/2017   Debility 09/22/2017   HCAP (healthcare-associated pneumonia) 08/06/2017   Chronic atrial fibrillation 07/29/2017   Acute respiratory failure with hypoxia (HCC) 07/07/2017   AF (paroxysmal atrial fibrillation) ([place])    Dyspnea 02/04/2017   Asthma exacerbation 85/63/1497   Metabolic syndrome 02/63/7858   Moderate persistent asthma with exacerbation 10/19/2016   Normocytic anemia 10/02/2016   Elevated hemoglobin A1c 05/05/2016   GERD (gastroesophageal reflux disease) 08/30/2015   Anxiety and depression 08/30/2015   Generalized anxiety disorder 08/30/2015   Seasonal allergic rhinitis 08/29/2013   Leukocytosis 10/21/2012   Tobacco abuse in remission 10/07/2012   COPD mixed type ([place]) 05/07/2012   Knee pain, bilateral 04/25/2011   Insomnia 03/14/2011   Obstructive sleep apnea 12/19/2010   Hypokalemia 08/13/2010  Cervical back pain with evidence of disc disease 04/08/2008   HTN (hypertension) 07/31/2006   Morbid obesity due to excess calories ([place]) 06/17/2006   Major depressive disorder, recurrent episode ([place]) 04/10/2006    Past Surgical History:  Procedure Laterality Date   CARDIOVERSION N/A 05/30/2017   Procedure: CARDIOVERSION;  Surgeon: Sanda Klein, MD;  Location: [facility];  Service: Cardiovascular;  Laterality: N/A;   REDUCTION MAMMAPLASTY Bilateral 09/2011   TUBAL LIGATION  1996   bilateral     OB History     Gravida  4   Para  4   Term      Preterm      AB      Living          SAB      IAB      Ectopic      Multiple      Live Births              Family History  Problem Relation Age of Onset   Hypertension Mother    Asthma Daughter    Cancer Paternal Aunt    Asthma Maternal Grandmother     Social History   Tobacco Use   Smoking status: Former    Packs/day: 0.50    Years: 26.00    Pack years: 13.00    Types: Cigarettes    Quit date: 09/12/2014    Years since quitting: 6.4   Smokeless tobacco: Never  Vaping Use   Vaping Use: Never used  Substance Use Topics   Alcohol use: No   Drug use: No    Home Medications Prior to Admission medications   Medication Sig Start Date End Date Taking? Authorizing Provider  albuterol (PROAIR HFA) 108 (90 Base) MCG/ACT inhaler Inhale 2 puffs every 6 hourss if needed- rescue 05/30/20   Baird Lyons D, MD  benzonatate (TESSALON) 100 MG capsule Take 1 capsule (100 mg total) by mouth every 8 (eight) hours. 01/25/21   Eustaquio Maize, PA-C  clonazePAM (KLONOPIN) 1 MG tablet 1 tab at bedtime for sleep as needed 10/12/20   Baird Lyons D, MD  cloNIDine (CATAPRES) 0.3 MG tablet Take 1 tablet (0.3 mg total) by mouth at bedtime. OFFICE VISIT NEEDED FOR ADDITIONAL REFILLS 11/29/20   [name], MD  DULoxetine (CYMBALTA) 60 MG capsule TAKE 1 CAPSULE(60 MG) BY MOUTH DAILY 09/08/20   Newlin, Charlane Ferretti, MD  dupilumab (DUPIXENT) 300 MG/2ML prefilled syringe INJECT 300 MG INTO THE SKIN EVERY 14 (FOURTEEN) DAYS. 01/22/21 01/22/22  Baird Lyons D, MD  EPINEPHrine 0.3 mg/0.3 mL IJ SOAJ injection Inject 0.3 mg into the muscle as needed for anaphylaxis. 05/30/20   Deneise Lever, MD  fluticasone (FLONASE) 50 MCG/ACT nasal spray Place 1 spray into both nostrils daily. 08/12/18   [name], MD  Fluticasone-Umeclidin-Vilant (TRELEGY ELLIPTA) 100-62.5-25 MCG/INH AEPB Inhale 1 puff into the lungs daily. 01/23/21   Deneise Lever, MD  furosemide (LASIX) 40 MG tablet Take 1 tablet (40 mg total) by mouth daily. 02/23/20    [name], MD  hydrALAZINE (APRESOLINE) 10 MG tablet Take 1 tablet (10 mg total) by mouth every 8 (eight) hours for 30 days. 08/04/18 10/26/27  [name], NP  ipratropium-albuterol (DUONEB) 0.5-2.5 (3) MG/3ML SOLN Nebulize 1 ampule every 6 hours if needed 08/21/20   Baird Lyons D, MD  metoprolol tartrate (LOPRESSOR) 25 MG tablet Take 1 tablet (25 mg total) by mouth 2 (two) times daily. Dx: HTN 02/23/20   Newlin, Charlane Ferretti,  MD  montelukast (SINGULAIR) 10 MG tablet Take 1 tablet (10 mg total) by mouth at bedtime. OFFICE VISIT NEEDED FOR ADDITIONAL REFILLS 11/29/20   [name], MD  omeprazole (PRILOSEC) 20 MG capsule Take 1 capsule (20 mg total) by mouth 2 (two) times daily before a meal. 02/23/20   [name], MD  oxyCODONE-acetaminophen (PERCOCET) 10-325 MG tablet Take 1 tablet by mouth 5 (five) times daily as needed for pain. 02/09/19   [provider]  OZEMPIC, 1 MG/DOSE, 2 MG/1.5ML SOPN Inject 1 mg into the skin every Wednesday.  01/21/19   [provider]  predniSONE (DELTASONE) 10 MG tablet Take 10mg  daily for 2 weeks 01/23/21   Baird Lyons D, MD  pregabalin (LYRICA) 75 MG capsule Take 75 mg by mouth 2 (two) times daily. 02/09/19   [provider]  Vitamin D, Ergocalciferol, (DRISDOL) 1.25 MG (50000 UT) CAPS capsule TAKE 1 CAPSULE BY MOUTH 1 TIME A WEEK Patient taking differently: Take 50,000 Units by mouth every Wednesday. 11/23/18   [name], MD  VYVANSE 70 MG capsule Take 70 mg by mouth daily. 01/20/19   [provider]  zolpidem (AMBIEN) 10 MG tablet TAKE 1 TABLET BY MOUTH AT BEDTIME AS NEEDED FOR SLEEP 01/26/21   Young, Clinton D, MD  mometasone-formoterol [facility]) 200-5 MCG/ACT AERO Inhale 2 puffs into the lungs 2 (two) times daily. Dx: Asthma 04/08/19 08/12/19  Deneise Lever, MD    Allergies    Contrast media [iodinated diagnostic agents]  Review of Systems   Review of Systems  Respiratory:  Positive for cough, shortness of  breath and wheezing.   All other systems reviewed and are negative.  Physical Exam Updated Vital Signs BP (!) 133/99   Pulse 86   Temp 98.7 F (37.1 C) (Oral)   Resp 20   Ht 5\' 6"  (1.676 m)   Wt (!) 148.3 kg   LMP 01/12/2021 (Approximate)   SpO2 100%   BMI 52.78 kg/m   Physical Exam Vitals and nursing note reviewed.  Constitutional:      General: She is in acute distress.     Appearance: She is well-developed. She is obese. She is ill-appearing.  HENT:     Head: Normocephalic and atraumatic.     Mouth/Throat:     Mouth: Mucous membranes are moist.     Pharynx: Oropharynx is clear.  Eyes:     Extraocular Movements: Extraocular movements intact.     Pupils: Pupils are equal, round, and reactive to light.  Cardiovascular:     Rate and Rhythm: Normal rate and regular rhythm.  Pulmonary:     Effort: Tachypnea and respiratory distress present.     Breath sounds: Wheezing present.  Abdominal:     General: Bowel sounds are normal.     Palpations: Abdomen is soft.  Musculoskeletal:        General: Normal range of motion.     Cervical back: Normal range of motion and neck supple.  Skin:    General: Skin is warm.     Capillary Refill: Capillary refill takes less than 2 seconds.  Neurological:     General: No focal deficit present.     Mental Status: She is alert and oriented to person, place, and time.  Psychiatric:        Mood and Affect: Mood normal.        Behavior: Behavior normal.    ED Results / Procedures / Treatments   Labs (all labs ordered are  listed, but only abnormal results are displayed) Labs Reviewed  CBC WITH DIFFERENTIAL/PLATELET - Abnormal; Notable for the following components:      Result Value   WBC 13.7 (*)    Hemoglobin 11.8 (*)    MCH 25.8 (*)    RDW 17.4 (*)    Neutro Abs 8.6 (*)    All other components within normal limits  BASIC METABOLIC PANEL - Abnormal; Notable for the following components:   Glucose, Bld 137 (*)    Calcium 8.1 (*)     All other components within normal limits  LACTATE DEHYDROGENASE - Abnormal; Notable for the following components:   LDH 241 (*)    All other components within normal limits  CULTURE, BLOOD (ROUTINE X 2)  CULTURE, BLOOD (ROUTINE X 2)  RESPIRATORY PANEL BY PCR  LACTIC ACID, PLASMA  D-DIMER, QUANTITATIVE  PROCALCITONIN  TRIGLYCERIDES  FIBRINOGEN  C-REACTIVE PROTEIN  FERRITIN  HIV ANTIBODY (ROUTINE TESTING W REFLEX)  CBC  BASIC METABOLIC PANEL  I-STAT BETA HCG BLOOD, ED ([facility], [facility], [facility] ONLY)  TROPONIN I (HIGH SENSITIVITY)  TROPONIN I (HIGH SENSITIVITY)    EKG EKG Interpretation  Date/Time:  Saturday February 03 2021 19:02:13 EDT Ventricular Rate:  98 PR Interval:  132 QRS Duration: 90 QT Interval:  384 QTC Calculation: 490 R Axis:   -5 Text Interpretation: Normal sinus rhythm Minimal voltage criteria for LVH, may be normal variant ( Cornell product ) T wave abnormality, consider lateral ischemia Abnormal ECG No significant change since last tracing Confirmed by Isla Pence [phone]) on 02/03/2021 9:36:00 PM  Radiology DG Chest 1 View  Result Date: 02/03/2021 CLINICAL DATA:  Dyspnea, COVID pneumonia EXAM: CHEST  1 VIEW COMPARISON:  01/25/2021 FINDINGS: The heart size and mediastinal contours are within normal limits. Both lungs are clear. The visualized skeletal structures are unremarkable. IMPRESSION: No active disease. Electronically Signed   By: Fidela Salisbury M.D.   On: 02/03/2021 19:36    Procedures Procedures   Medications Ordered in ED Medications  albuterol (PROVENTIL) (2.5 MG/3ML) 0.083% nebulizer solution (10 mg/hr Nebulization New Bag/Given 02/03/21 2152)  magnesium sulfate IVPB 2 g 50 mL (2 g Intravenous New Bag/Given 02/03/21 2221)  EPINEPHrine (EPI-PEN) injection 0.3 mg (has no administration in time range)  furosemide (LASIX) tablet 40 mg (has no administration in time range)  hydrALAZINE (APRESOLINE) tablet 10 mg (has no administration in time range)   metoprolol tartrate (LOPRESSOR) tablet 25 mg (has no administration in time range)  DULoxetine (CYMBALTA) DR capsule 60 mg (has no administration in time range)  clonazePAM (KLONOPIN) tablet 0.5 mg (has no administration in time range)  albuterol (VENTOLIN HFA) 108 (90 Base) MCG/ACT inhaler 1-2 puff (has no administration in time range)  Fluticasone-Umeclidin-Vilant 100-62.5-25 MCG/INH AEPB 1 puff (has no administration in time range)  montelukast (SINGULAIR) tablet 10 mg (has no administration in time range)  enoxaparin (LOVENOX) injection 40 mg (has no administration in time range)  predniSONE (DELTASONE) tablet 40 mg (has no administration in time range)  ipratropium-albuterol (DUONEB) 0.5-2.5 (3) MG/3ML nebulizer solution 3 mL (has no administration in time range)  albuterol (PROVENTIL) (2.5 MG/3ML) 0.083% nebulizer solution 2.5 mg (has no administration in time range)  sodium chloride flush (NS) 0.9 % injection 3 mL (has no administration in time range)  acetaminophen (TYLENOL) tablet 650 mg (has no administration in time range)    Or  acetaminophen (TYLENOL) suppository 650 mg (has no administration in time range)  polyethylene glycol (MIRALAX / GLYCOLAX) packet 17  g (has no administration in time range)  methylPREDNISolone sodium succinate (SOLU-MEDROL) 125 mg/2 mL injection 125 mg (125 mg Intravenous Given 02/03/21 1942)    ED Course  I have reviewed the triage vital signs and the nursing notes.  Pertinent labs & imaging results that were available during my care of the patient were reviewed by me and considered in my medical decision making (see chart for details).    MDM Rules/Calculators/A&P                           Pt is given solumedrol, magnesium, and nebs.  She is still wheezing, but has improved somewhat.  Pt is still not back to her normal respiratory status.  Pt's Covid sx started nearly a month ago, so I think this is primarily a asthma exac.  Pt d/w Dr. Trilby Drummer  (triad) for admission.  CRITICAL CARE Performed by: Isla Pence   Total critical care time: 30 minutes  Critical care time was exclusive of separately billable procedures and treating other patients.  Critical care was necessary to treat or prevent imminent or life-threatening deterioration.  Critical care was time spent personally by me on the following activities: development of treatment plan with patient and/or surrogate as well as nursing, discussions with consultants, evaluation of patient's response to treatment, examination of patient, obtaining history from patient or surrogate, ordering and performing treatments and interventions, ordering and review of laboratory studies, ordering and review of radiographic studies, pulse oximetry and re-evaluation of patient's condition.   April [name] was evaluated in Emergency Department on 02/03/2021 for the symptoms described in the history of present illness. She was evaluated in the context of the global COVID-19 pandemic, which necessitated consideration that the patient might be at risk for infection with the SARS-CoV-2 virus that causes COVID-19. Institutional protocols and algorithms that pertain to the evaluation of patients at risk for COVID-19 are in a state of rapid change based on information released by regulatory bodies including the CDC and federal and state organizations. These policies and algorithms were followed during the patient's care in the ED.  Final Clinical Impression(s) / ED Diagnoses Final diagnoses:  Severe persistent asthma with exacerbation  COVID-19    Rx / DC Orders ED Discharge Orders     None        Isla Pence, MD 02/03/21 2300

## 2021-02-03 NOTE — ED Triage Notes (Signed)
Pt was BIB [facility] c/o SOB with audible wheezing. Recently diagnosed COVID +. On 3 L of oxygen.

## 2021-02-04 DIAGNOSIS — Z6841 Body Mass Index (BMI) 40.0 and over, adult: Secondary | ICD-10-CM | POA: Diagnosis not present

## 2021-02-04 DIAGNOSIS — J302 Other seasonal allergic rhinitis: Secondary | ICD-10-CM | POA: Diagnosis present

## 2021-02-04 DIAGNOSIS — M069 Rheumatoid arthritis, unspecified: Secondary | ICD-10-CM | POA: Diagnosis present

## 2021-02-04 DIAGNOSIS — Z23 Encounter for immunization: Secondary | ICD-10-CM | POA: Diagnosis present

## 2021-02-04 DIAGNOSIS — F411 Generalized anxiety disorder: Secondary | ICD-10-CM | POA: Diagnosis present

## 2021-02-04 DIAGNOSIS — E785 Hyperlipidemia, unspecified: Secondary | ICD-10-CM | POA: Diagnosis present

## 2021-02-04 DIAGNOSIS — R0602 Shortness of breath: Secondary | ICD-10-CM | POA: Diagnosis present

## 2021-02-04 DIAGNOSIS — L83 Acanthosis nigricans: Secondary | ICD-10-CM | POA: Diagnosis present

## 2021-02-04 DIAGNOSIS — G8929 Other chronic pain: Secondary | ICD-10-CM | POA: Diagnosis present

## 2021-02-04 DIAGNOSIS — Z20822 Contact with and (suspected) exposure to covid-19: Secondary | ICD-10-CM | POA: Diagnosis present

## 2021-02-04 DIAGNOSIS — J9601 Acute respiratory failure with hypoxia: Secondary | ICD-10-CM | POA: Diagnosis present

## 2021-02-04 DIAGNOSIS — I480 Paroxysmal atrial fibrillation: Secondary | ICD-10-CM | POA: Diagnosis present

## 2021-02-04 DIAGNOSIS — Z8616 Personal history of COVID-19: Secondary | ICD-10-CM | POA: Diagnosis not present

## 2021-02-04 DIAGNOSIS — J4551 Severe persistent asthma with (acute) exacerbation: Secondary | ICD-10-CM | POA: Diagnosis present

## 2021-02-04 DIAGNOSIS — I110 Hypertensive heart disease with heart failure: Secondary | ICD-10-CM | POA: Diagnosis present

## 2021-02-04 DIAGNOSIS — E559 Vitamin D deficiency, unspecified: Secondary | ICD-10-CM | POA: Diagnosis present

## 2021-02-04 DIAGNOSIS — J441 Chronic obstructive pulmonary disease with (acute) exacerbation: Secondary | ICD-10-CM | POA: Diagnosis present

## 2021-02-04 DIAGNOSIS — I5032 Chronic diastolic (congestive) heart failure: Secondary | ICD-10-CM | POA: Diagnosis present

## 2021-02-04 DIAGNOSIS — G4700 Insomnia, unspecified: Secondary | ICD-10-CM | POA: Diagnosis present

## 2021-02-04 DIAGNOSIS — G4733 Obstructive sleep apnea (adult) (pediatric): Secondary | ICD-10-CM | POA: Diagnosis present

## 2021-02-04 DIAGNOSIS — F32A Depression, unspecified: Secondary | ICD-10-CM | POA: Diagnosis present

## 2021-02-04 DIAGNOSIS — K219 Gastro-esophageal reflux disease without esophagitis: Secondary | ICD-10-CM | POA: Diagnosis present

## 2021-02-04 DIAGNOSIS — M179 Osteoarthritis of knee, unspecified: Secondary | ICD-10-CM | POA: Diagnosis present

## 2021-02-04 DIAGNOSIS — F17200 Nicotine dependence, unspecified, uncomplicated: Secondary | ICD-10-CM | POA: Diagnosis present

## 2021-02-04 LAB — URINALYSIS, ROUTINE W REFLEX MICROSCOPIC
Bilirubin Urine: NEGATIVE
Glucose, UA: NEGATIVE mg/dL
Hgb urine dipstick: NEGATIVE
Ketones, ur: NEGATIVE mg/dL
Leukocytes,Ua: NEGATIVE
Nitrite: NEGATIVE
Protein, ur: NEGATIVE mg/dL
Specific Gravity, Urine: 1.012 (ref 1.005–1.030)
pH: 6 (ref 5.0–8.0)

## 2021-02-04 LAB — RESPIRATORY PANEL BY PCR

## 2021-02-04 LAB — HIV ANTIBODY (ROUTINE TESTING W REFLEX): HIV Screen 4th Generation wRfx: NONREACTIVE

## 2021-02-04 LAB — BASIC METABOLIC PANEL
Anion gap: 6 (ref 5–15)
BUN: 10 mg/dL (ref 6–20)
CO2: 23 mmol/L (ref 22–32)
Calcium: 8 mg/dL — ABNORMAL LOW (ref 8.9–10.3)
Chloride: 103 mmol/L (ref 98–111)
Creatinine, Ser: 0.77 mg/dL (ref 0.44–1.00)
GFR, Estimated: >60 mL/min (ref >60)
Glucose, Bld: 256 mg/dL — ABNORMAL HIGH (ref 70–99)
Potassium: 3.9 mmol/L (ref 3.5–5.1)
Sodium: 132 mmol/L — ABNORMAL LOW (ref 135–145)

## 2021-02-04 LAB — CBC
HCT: 38.6 % (ref 36.0–46.0)
Hemoglobin: 11.9 g/dL — ABNORMAL LOW (ref 12.0–15.0)
MCH: 26 pg (ref 26.0–34.0)
MCHC: 30.8 g/dL (ref 30.0–36.0)
MCV: 84.3 fL (ref 80.0–100.0)
Platelets: 360 10*3/uL (ref 150–400)
RBC: 4.58 MIL/uL (ref 3.87–5.11)
RDW: 17.5 % — ABNORMAL HIGH (ref 11.5–15.5)
WBC: 13.8 10*3/uL — ABNORMAL HIGH (ref 4.0–10.5)
nRBC: 0 % (ref 0.0–0.2)

## 2021-02-04 LAB — OSMOLALITY: Osmolality: 295 mOsm/kg (ref 275–295)

## 2021-02-04 LAB — CBG MONITORING, ED
Glucose-Capillary: 147 mg/dL — ABNORMAL HIGH (ref 70–99)
Glucose-Capillary: 170 mg/dL — ABNORMAL HIGH (ref 70–99)
Glucose-Capillary: 181 mg/dL — ABNORMAL HIGH (ref 70–99)

## 2021-02-04 LAB — C-REACTIVE PROTEIN: CRP: 3.9 mg/dL — ABNORMAL HIGH (ref <1.0)

## 2021-02-04 LAB — OSMOLALITY, URINE: Osmolality, Ur: 483 mOsm/kg (ref 300–900)

## 2021-02-04 LAB — D-DIMER, QUANTITATIVE: D-Dimer, Quant: 0.36 ug/mL-FEU (ref 0.00–0.50)

## 2021-02-04 LAB — URIC ACID: Uric Acid, Serum: 4.5 mg/dL (ref 2.5–7.1)

## 2021-02-04 LAB — SODIUM, URINE, RANDOM: Sodium, Ur: 123 mmol/L

## 2021-02-04 LAB — FERRITIN: Ferritin: 14 ng/mL (ref 11–307)

## 2021-02-04 MED ORDER — AZITHROMYCIN 500 MG PO TABS
250.0000 mg | ORAL_TABLET | Freq: Every day | ORAL | Status: DC
  Administered 2021-02-05 – 2021-02-07 (×3): 250 mg via ORAL
  Filled 2021-02-04 (×4): qty 1

## 2021-02-04 MED ORDER — AZITHROMYCIN 250 MG PO TABS
500.0000 mg | ORAL_TABLET | Freq: Every day | ORAL | Status: AC
  Administered 2021-02-04: 500 mg via ORAL

## 2021-02-04 MED ORDER — PANTOPRAZOLE SODIUM 40 MG PO TBEC
40.0000 mg | DELAYED_RELEASE_TABLET | Freq: Two times a day (BID) | ORAL | Status: DC
  Administered 2021-02-04 – 2021-02-07 (×6): 40 mg via ORAL
  Filled 2021-02-04 (×6): qty 1

## 2021-02-04 MED ORDER — OXYCODONE-ACETAMINOPHEN 10-325 MG PO TABS: 1.0000 | ORAL_TABLET | Freq: Three times a day (TID) | ORAL | Status: DC | PRN

## 2021-02-04 MED ORDER — NICOTINE 21 MG/24HR TD PT24
21.0000 mg | MEDICATED_PATCH | Freq: Once | TRANSDERMAL | Status: AC
  Administered 2021-02-04: 21 mg via TRANSDERMAL
  Filled 2021-02-04: qty 1

## 2021-02-04 MED ORDER — FLUTICASONE FUROATE-VILANTEROL 100-25 MCG/INH IN AEPB
1.0000 | INHALATION_SPRAY | Freq: Every day | RESPIRATORY_TRACT | Status: DC
  Administered 2021-02-04 – 2021-02-07 (×4): 1 via RESPIRATORY_TRACT
  Filled 2021-02-04: qty 28

## 2021-02-04 MED ORDER — IPRATROPIUM-ALBUTEROL 0.5-2.5 (3) MG/3ML IN SOLN
3.0000 mL | Freq: Four times a day (QID) | RESPIRATORY_TRACT | Status: DC
  Administered 2021-02-04 – 2021-02-05 (×6): 3 mL via RESPIRATORY_TRACT
  Filled 2021-02-04 (×6): qty 3

## 2021-02-04 MED ORDER — ALBUTEROL SULFATE HFA 108 (90 BASE) MCG/ACT IN AERS
2.0000 | INHALATION_SPRAY | Freq: Four times a day (QID) | RESPIRATORY_TRACT | Status: DC
  Administered 2021-02-04 (×2): 2 via RESPIRATORY_TRACT
  Filled 2021-02-04 (×2): qty 6.7

## 2021-02-04 MED ORDER — METHYLPREDNISOLONE SODIUM SUCC 40 MG IJ SOLR
40.0000 mg | Freq: Two times a day (BID) | INTRAMUSCULAR | Status: DC
  Administered 2021-02-04 – 2021-02-07 (×7): 40 mg via INTRAVENOUS
  Filled 2021-02-04 (×7): qty 1

## 2021-02-04 MED ORDER — LISDEXAMFETAMINE DIMESYLATE 70 MG PO CAPS
70.0000 mg | ORAL_CAPSULE | Freq: Every day | ORAL | Status: DC
  Administered 2021-02-05 – 2021-02-07 (×3): 70 mg via ORAL
  Filled 2021-02-04 (×3): qty 1

## 2021-02-04 MED ORDER — INSULIN ASPART 100 UNIT/ML IJ SOLN: 0.0000 [IU] | Freq: Every day | INTRAMUSCULAR | Status: DC

## 2021-02-04 MED ORDER — UMECLIDINIUM BROMIDE 62.5 MCG/INH IN AEPB
1.0000 | INHALATION_SPRAY | Freq: Every day | RESPIRATORY_TRACT | Status: DC
  Administered 2021-02-04: 1 via RESPIRATORY_TRACT
  Filled 2021-02-04: qty 7

## 2021-02-04 MED ORDER — ALBUTEROL SULFATE HFA 108 (90 BASE) MCG/ACT IN AERS: 2.0000 | INHALATION_SPRAY | Freq: Four times a day (QID) | RESPIRATORY_TRACT | Status: DC | PRN

## 2021-02-04 MED ORDER — AMLODIPINE BESYLATE 10 MG PO TABS
10.0000 mg | ORAL_TABLET | Freq: Every day | ORAL | Status: DC
  Administered 2021-02-04 – 2021-02-07 (×4): 10 mg via ORAL
  Filled 2021-02-04 (×2): qty 1
  Filled 2021-02-04 (×2): qty 2

## 2021-02-04 MED ORDER — APIXABAN 5 MG PO TABS
5.0000 mg | ORAL_TABLET | Freq: Two times a day (BID) | ORAL | Status: DC
Start: 2021-02-04 — End: 2021-02-07
  Administered 2021-02-04 – 2021-02-07 (×7): 5 mg via ORAL
  Filled 2021-02-04 (×7): qty 1

## 2021-02-04 MED ORDER — OXYCODONE-ACETAMINOPHEN 5-325 MG PO TABS
2.0000 | ORAL_TABLET | Freq: Four times a day (QID) | ORAL | Status: DC | PRN
Start: 2021-02-04 — End: 2021-02-07
  Administered 2021-02-04 – 2021-02-07 (×7): 2 via ORAL
  Filled 2021-02-04 (×7): qty 2

## 2021-02-04 MED ORDER — HYDRALAZINE HCL 20 MG/ML IJ SOLN
10.0000 mg | Freq: Four times a day (QID) | INTRAMUSCULAR | Status: DC | PRN
  Administered 2021-02-05: 10 mg via INTRAVENOUS
  Filled 2021-02-04 (×2): qty 1

## 2021-02-04 MED ORDER — ATORVASTATIN CALCIUM 10 MG PO TABS
20.0000 mg | ORAL_TABLET | Freq: Every day | ORAL | Status: DC
  Administered 2021-02-05 – 2021-02-07 (×3): 20 mg via ORAL
  Filled 2021-02-04 (×3): qty 2

## 2021-02-04 MED ORDER — TOPIRAMATE 25 MG PO TABS: 200.0000 mg | ORAL_TABLET | Freq: Every day | ORAL | Status: DC

## 2021-02-04 MED ORDER — ALBUTEROL SULFATE (2.5 MG/3ML) 0.083% IN NEBU
2.5000 mg | INHALATION_SOLUTION | RESPIRATORY_TRACT | Status: DC | PRN
  Administered 2021-02-04 – 2021-02-06 (×5): 2.5 mg via RESPIRATORY_TRACT
  Filled 2021-02-04 (×4): qty 3

## 2021-02-04 MED ORDER — SERTRALINE HCL 50 MG PO TABS
50.0000 mg | ORAL_TABLET | Freq: Every day | ORAL | Status: DC
  Administered 2021-02-04 – 2021-02-06 (×3): 50 mg via ORAL
  Filled 2021-02-04 (×4): qty 1

## 2021-02-04 MED ORDER — TOPIRAMATE 25 MG PO TABS
100.0000 mg | ORAL_TABLET | Freq: Every day | ORAL | Status: DC
  Administered 2021-02-04 – 2021-02-06 (×3): 100 mg via ORAL
  Filled 2021-02-04 (×3): qty 4

## 2021-02-04 MED ORDER — INSULIN ASPART 100 UNIT/ML IJ SOLN
0.0000 [IU] | Freq: Three times a day (TID) | INTRAMUSCULAR | Status: DC
  Administered 2021-02-04: 3 [IU] via SUBCUTANEOUS
  Administered 2021-02-04: 2 [IU] via SUBCUTANEOUS
  Administered 2021-02-05: 3 [IU] via SUBCUTANEOUS
  Administered 2021-02-06 (×2): 2 [IU] via SUBCUTANEOUS
  Administered 2021-02-06 – 2021-02-07 (×2): 3 [IU] via SUBCUTANEOUS

## 2021-02-04 MED ORDER — OXYCODONE-ACETAMINOPHEN 5-325 MG PO TABS
1.0000 | ORAL_TABLET | Freq: Four times a day (QID) | ORAL | Status: DC | PRN
  Administered 2021-02-04: 1 via ORAL
  Filled 2021-02-04: qty 1

## 2021-02-04 MED ORDER — CARVEDILOL 6.25 MG PO TABS
6.2500 mg | ORAL_TABLET | Freq: Two times a day (BID) | ORAL | Status: DC
  Administered 2021-02-05 – 2021-02-07 (×5): 6.25 mg via ORAL
  Filled 2021-02-04 (×2): qty 1
  Filled 2021-02-04 (×2): qty 2
  Filled 2021-02-04 (×2): qty 1

## 2021-02-04 MED ORDER — BACLOFEN 10 MG PO TABS
10.0000 mg | ORAL_TABLET | Freq: Three times a day (TID) | ORAL | Status: DC | PRN
  Filled 2021-02-04: qty 1

## 2021-02-04 MED ORDER — PANTOPRAZOLE SODIUM 40 MG PO TBEC
40.0000 mg | DELAYED_RELEASE_TABLET | Freq: Every day | ORAL | Status: DC
  Administered 2021-02-04: 40 mg via ORAL
  Filled 2021-02-04: qty 1

## 2021-02-04 MED ORDER — NALOXONE HCL 4 MG/0.1ML NA LIQD: 1.0000 | Freq: Four times a day (QID) | NASAL | Status: DC | PRN

## 2021-02-04 MED ORDER — ALBUTEROL SULFATE HFA 108 (90 BASE) MCG/ACT IN AERS: 2.0000 | INHALATION_SPRAY | RESPIRATORY_TRACT | Status: DC | PRN

## 2021-02-04 NOTE — ED Notes (Signed)
Pt requested CPAP machine for sleep, RN notified

## 2021-02-04 NOTE — ED Notes (Signed)
A purewick was placed on the pt with no incident

## 2021-02-04 NOTE — ED Notes (Signed)
Dinner Ordered 

## 2021-02-04 NOTE — Progress Notes (Signed)
ANTICOAGULATION CONSULT NOTE - Initial Consult  Pharmacy Consult for Apixaban Indication: atrial fibrillation  Allergies  Allergen Reactions   Contrast Media [Iodinated Diagnostic Agents] Itching    Ct contrast    Patient Measurements: Height: 5\' 6"  (167.6 cm) Weight: (!) 148.3 kg (327 lb) IBW/kg (Calculated) : 59.3  Vital Signs: Temp: 98.7 F (37.1 C) (10/09 0800) Temp Source: Oral (10/09 0800) BP: 148/86 (10/09 0800) Pulse Rate: 76 (10/09 0800)  Labs: Recent Labs    02/03/21 1956 02/03/21 2048 02/04/21 0355 02/04/21 0530  HGB 11.8*  --   --  11.9*  HCT 38.4  --   --  38.6  PLT 352  --   --  360  CREATININE 0.71  --  0.77  --   TROPONINIHS 4 4  --   --     Estimated Creatinine Clearance: 128.8 mL/min (by C-G formula based on SCr of 0.77 mg/dL).   Medical History: Past Medical History:  Diagnosis Date   Acanthosis nigricans    Anxiety    Arthritis    "knees" (04/28/2017)   Asthma    Followed by Dr. Annamaria Boots (pulmonology); receives every other week omalizumab injections; has frequent exacerbations   Back pain    Chronic diastolic CHF (congestive heart failure) ([place]) 01/17/2017   COPD (chronic obstructive pulmonary disease) ([place])    PFTs in 2002, FEV1/FVC 65, no post bronchodilater test done   Depression    GERD (gastroesophageal reflux disease)    Headache(784.0)    "q couple days" (81/44/8185)   Helicobacter pylori (H. pylori) infection    Hypertension, essential    Insomnia    Joint pain    Lower extremity edema    Menorrhagia    Morbid obesity ([place])    OSA on CPAP    Sleep study 2008 - mild OSA, not enough events to titrate CPAP; wears CPAP now/pt on 04/28/2017   Pneumonia X 1   Prediabetes    Rheumatoid arthritis (HCC)    Seasonal allergies    Shortness of breath    Tobacco user    Vitamin D deficiency     Medications:  (Not in a hospital admission)  Scheduled:   amLODipine  10 mg Oral Daily   atorvastatin  20 mg Oral Daily   azithromycin   500 mg Oral Daily   Followed by   Derrill Memo ON 02/05/2021] azithromycin  250 mg Oral Daily   carvedilol  6.25 mg Oral BID WC   DULoxetine  60 mg Oral Daily   enoxaparin (LOVENOX) injection  40 mg Subcutaneous Daily   fluticasone furoate-vilanterol  1 puff Inhalation Daily   furosemide  40 mg Oral Daily   insulin aspart  0-15 Units Subcutaneous TID WC   insulin aspart  0-5 Units Subcutaneous QHS   ipratropium-albuterol  3 mL Nebulization QID   lisdexamfetamine  70 mg Oral Daily   methylPREDNISolone (SOLU-MEDROL) injection  40 mg Intravenous Q12H   montelukast  10 mg Oral QHS   pantoprazole  40 mg Oral Daily   pregabalin  300 mg Oral BID   sertraline  50 mg Oral QHS   sodium chloride flush  3 mL Intravenous Q12H   topiramate  100 mg Oral QHS   Infusions:  PRN: acetaminophen **OR** [DISCONTINUED] acetaminophen, albuterol, albuterol, baclofen, clonazePAM, EPINEPHrine, hydrALAZINE, naloxone, oxyCODONE-acetaminophen, polyethylene glycol  Assessment: 60 yof with a history of asthma, COPD with recurrent exacerbation, atrial fibrillation, anxiety, depression, CHF, GERD, hypertension, OSA, lumbar arthritis . Patient presenting with SOB.  Pharmacy consult placed for apixaban for AF.  Patient not on anticoagulation prior to consult. Has apparently been on anticoagulation previously.  SCr 0.77 Hgb 11.9; Plt 360  Goal of Therapy:  Monitor platelets by anticoagulation protocol: Yes   Plan:  Start apixaban 5mg  BID Monitor for s/s hemorrhage & CBC Re-educate patient prior to discharge  Lorelei Pont, PharmD, BCPS 02/04/2021 9:44 AM ED Clinical Pharmacist -  [phone]

## 2021-02-04 NOTE — ED Notes (Signed)
Lunch ordered 

## 2021-02-04 NOTE — Progress Notes (Signed)
PROGRESS NOTE                                                                                                                                                                                                             Patient Demographics:    April [name], is a 50 y.o. female, DOB - 17-Oct-1970, TXH:[id number]  Outpatient Primary MD for the patient is [name], MD    LOS - 0  Admit date - 02/03/2021    Chief Complaint  Patient presents with   Shortness of Breath       Brief Narrative (HPI from H&P)   April [name] is a 50 y.o. female with medical history significant of asthma, COPD with recurrent exacerbation, atrial fibrillation, anxiety, depression, CHF, GERD, hypertension, OSA, lumbar arthritis who presents with ongoing shortness of breath, was diagnosed with COPD exacerbation and admitted.   Subjective:    April [name] today has, No headache, No chest pain, No abdominal pain - No Nausea, No new weakness tingling or numbness, +ve SOB   Assessment  & Plan :     Acute Hypoxic Resp. Failure due to Acute on Chr. COPD Exacerbation - over 10 days from Covid infection, stable CRP and CXR hence no active infection, for COPD Exacerbation, Azithromycin, IV Steroids, Nebs and o2.  Encouraged the patient to sit up in chair in the daytime use I-S and flutter valve for pulmonary toiletry.  Will advance activity and titrate down oxygen as possible.  SpO2: 100 % O2 Flow Rate (L/min): 4 L/min  Recent Labs  Lab 02/03/21 1956 02/03/21 2015 02/03/21 2048 02/04/21 0355 02/04/21 0530  WBC 13.7*  --   --   --  13.8*  HGB 11.8*  --   --   --  11.9*  HCT 38.4  --   --   --  38.6  PLT 352  --   --   --  360  CRP  --   --   --  3.9*  --   DDIMER  --  0.47  --   --   --   PROCALCITON  --  <0.10  --   --   --   LATICACIDVEN  --   --  0.9  --   --     2. Morbid Obesity - BMI > 40, OSA -  OHS - follow with PCP for weight loss, outpt  sleep study, o2 QHS.  3. H/O Parox. Afib - [place] VSAC 2 score 3 - Coreg, add Eliquis - was on it in the past.  4. Chronic Diastolic CHF EF 63% in 1497 - compensated, Coreg.  5. HTN - Coreg, Norvasc combination.  6. Smoking  - counseled to quit.  7. Dyslipidemia - Statin.  8. Anxiety - Depression  - Continue home duloxetine and as needed clonazepam.  9. Chronic Pain - Home Rx.  10. High Sugars - ? Steroids, A1c, ISS  Lab Results  Component Value Date   HGBA1C 5.6 (A) 03/24/2019    CBG (last 3)  No results for input(s): GLUCAP in the last 72 hours.       Condition - Extremely Guarded  Family Communication  :  family member sleeping in the rm 02/04/21  Code Status :  Full  Consults  :  None  PUD Prophylaxis : None   Procedures  :            Disposition Plan  :    Status is: Observation  Dispo: The patient is from: Home              Anticipated d/c is to: Home              Patient currently is not medically stable to d/c.   Difficult to place patient No  DVT Prophylaxis  :    enoxaparin (LOVENOX) injection 40 mg Start: 02/04/21 1000    Lab Results  Component Value Date   PLT 360 02/04/2021    Diet :  Diet Order             Diet regular Room service appropriate? Yes; Fluid consistency: Thin  Diet effective now                    Inpatient Medications  Scheduled Meds:  albuterol  2 puff Inhalation Q6H   amLODipine  10 mg Oral Daily   atorvastatin  20 mg Oral Daily   carvedilol  6.25 mg Oral BID WC   DULoxetine  60 mg Oral Daily   enoxaparin (LOVENOX) injection  40 mg Subcutaneous Daily   fluticasone furoate-vilanterol  1 puff Inhalation Daily   And   umeclidinium bromide  1 puff Inhalation Daily   furosemide  40 mg Oral Daily   insulin aspart  0-15 Units Subcutaneous TID WC   insulin aspart  0-5 Units Subcutaneous QHS   lisdexamfetamine  70 mg Oral Daily   methylPREDNISolone (SOLU-MEDROL) injection  40 mg Intravenous Q12H    montelukast  10 mg Oral QHS   pregabalin  300 mg Oral BID   sertraline  50 mg Oral QHS   sodium chloride flush  3 mL Intravenous Q12H   topiramate  100 mg Oral QHS   Continuous Infusions:  albuterol Stopped (02/04/21 0557)   PRN Meds:.acetaminophen **OR** acetaminophen, albuterol, baclofen, clonazePAM, EPINEPHrine, hydrALAZINE, naloxone, oxyCODONE-acetaminophen, polyethylene glycol  Antibiotics  :    Anti-infectives (From admission, onward)    None        Time Spent in minutes  30   Lala Lund M.D on 02/04/2021 at 9:22 AM  To page go to [email or web address]   [facility] -  Office  [phone]    See all Orders from today for further details    Objective:   Vitals:   02/04/21 0500 02/04/21 0600 02/04/21 0700 02/04/21 0800  BP: [name])  161/87 (!) 141/61 (!) 137/59 (!) 148/86  Pulse: 78 72 73 76  Resp: (P) 20 20 (!) 25 (!) 22  Temp:    98.7 F (37.1 C)  TempSrc:    Oral  SpO2: 97% 99% 100% 100%  Weight:      Height:        Wt Readings from Last 3 Encounters:  02/03/21 (!) 148.3 kg  01/25/21 (!) 148.4 kg  01/22/21 (!) 147.5 kg     Intake/Output Summary (Last 24 hours) at 02/04/2021 0922 Last data filed at 02/04/2021 0031 Gross per 24 hour  Intake 50 ml  Output --  Net 50 ml     Physical Exam  Awake Alert, No new F.N deficits, Normal affect [place].AT,PERRAL Supple Neck,No JVD, No cervical lymphadenopathy appriciated.  Symmetrical Chest wall movement, Good air movement bilaterally, mild wheezing RRR,No Gallops,Rubs or new Murmurs, No Parasternal Heave +ve B.Sounds, Abd Soft, No tenderness,   No Cyanosis, Clubbing or edema, No new Rash or bruise      Data Review:    CBC Recent Labs  Lab 02/03/21 1956 02/04/21 0530  WBC 13.7* 13.8*  HGB 11.8* 11.9*  HCT 38.4 38.6  PLT 352 360  MCV 83.8 84.3  MCH 25.8* 26.0  MCHC 30.7 30.8  RDW 17.4* 17.5*  LYMPHSABS 3.7  --   MONOABS 1.0  --   EOSABS 0.3  --   BASOSABS 0.0  --     Recent Labs  Lab  02/03/21 1956 02/03/21 2015 02/03/21 2048 02/04/21 0355  NA 137  --   --  132*  K 3.9  --   --  3.9  CL 106  --   --  103  CO2 24  --   --  23  GLUCOSE 137*  --   --  256*  BUN 6  --   --  10  CREATININE 0.71  --   --  0.77  CALCIUM 8.1*  --   --  8.0*  CRP  --   --   --  3.9*  DDIMER  --  0.47  --   --   PROCALCITON  --  <0.10  --   --   LATICACIDVEN  --   --  0.9  --     ------------------------------------------------------------------------------------------------------------------ Recent Labs    02/03/21 2015  TRIG 101    Lab Results  Component Value Date   HGBA1C 5.6 (A) 03/24/2019   ------------------------------------------------------------------------------------------------------------------ No results for input(s): TSH, T4TOTAL, T3FREE, THYROIDAB in the last 72 hours.  Invalid input(s): FREET3  Cardiac Enzymes No results for input(s): CKMB, TROPONINI, MYOGLOBIN in the last 168 hours.  Invalid input(s): CK ------------------------------------------------------------------------------------------------------------------    Component Value Date/Time   BNP 115.3 (H) 08/12/2020 1351     Radiology Reports DG Chest 1 View  Result Date: 02/03/2021 CLINICAL DATA:  Dyspnea, COVID pneumonia EXAM: CHEST  1 VIEW COMPARISON:  01/25/2021 FINDINGS: The heart size and mediastinal contours are within normal limits. Both lungs are clear. The visualized skeletal structures are unremarkable. IMPRESSION: No active disease. Electronically Signed   By: Fidela Salisbury M.D.   On: 02/03/2021 19:36   DG Chest Port 1 View  Result Date: 01/25/2021 CLINICAL DATA:  Asthma exacerbation.  Shortness of breath. EXAM: PORTABLE CHEST 1 VIEW COMPARISON:  08/12/2020 FINDINGS: The cardiac silhouette, mediastinal and hilar contours are within normal limits and stable. Chronic emphysematous and bronchitic type lung changes but no definite infiltrates or effusions. IMPRESSION: Chronic lung  changes  but no acute pulmonary findings. Electronically Signed   By: Marijo Sanes M.D.   On: 01/25/2021 18:36

## 2021-02-04 NOTE — ED Notes (Signed)
Pt was hooked back up to the monitor and a purewick was put on. Dinner tray was set on the bedside table within reach.

## 2021-02-04 NOTE — ED Notes (Signed)
The pt has wet the bed  pulled everything off bp cuff female wick ekg monitor and pulse ox wire  bed chabged pt hooked back up to everything  tries to get out of the bed intermittently  pt is confused

## 2021-02-05 ENCOUNTER — Ambulatory Visit: Payer: Medicare Other

## 2021-02-05 ENCOUNTER — Inpatient Hospital Stay (HOSPITAL_COMMUNITY): Payer: Medicare Other

## 2021-02-05 LAB — COMPREHENSIVE METABOLIC PANEL
ALT: 14 U/L (ref 0–44)
AST: 14 U/L — ABNORMAL LOW (ref 15–41)
Albumin: 2.9 g/dL — ABNORMAL LOW (ref 3.5–5.0)
Alkaline Phosphatase: 66 U/L (ref 38–126)
Anion gap: 8 (ref 5–15)
BUN: 13 mg/dL (ref 6–20)
CO2: 24 mmol/L (ref 22–32)
Calcium: 8.6 mg/dL — ABNORMAL LOW (ref 8.9–10.3)
Chloride: 104 mmol/L (ref 98–111)
Creatinine, Ser: 0.82 mg/dL (ref 0.44–1.00)
GFR, Estimated: >60 mL/min (ref >60)
Glucose, Bld: 166 mg/dL — ABNORMAL HIGH (ref 70–99)
Potassium: 4 mmol/L (ref 3.5–5.1)
Sodium: 136 mmol/L (ref 135–145)
Total Bilirubin: 0.4 mg/dL (ref 0.3–1.2)
Total Protein: 7.2 g/dL (ref 6.5–8.1)

## 2021-02-05 LAB — GLUCOSE, CAPILLARY
Glucose-Capillary: 119 mg/dL — ABNORMAL HIGH (ref 70–99)
Glucose-Capillary: 176 mg/dL — ABNORMAL HIGH (ref 70–99)

## 2021-02-05 LAB — CBC WITH DIFFERENTIAL/PLATELET
Abs Immature Granulocytes: 0.21 10*3/uL — ABNORMAL HIGH (ref 0.00–0.07)
Basophils Absolute: 0 10*3/uL (ref 0.0–0.1)
Basophils Relative: 0 %
Eosinophils Absolute: 0 10*3/uL (ref 0.0–0.5)
Eosinophils Relative: 0 %
HCT: 37.2 % (ref 36.0–46.0)
Hemoglobin: 11.4 g/dL — ABNORMAL LOW (ref 12.0–15.0)
Immature Granulocytes: 1 %
Lymphocytes Relative: 11 %
Lymphs Abs: 2 10*3/uL (ref 0.7–4.0)
MCH: 25.9 pg — ABNORMAL LOW (ref 26.0–34.0)
MCHC: 30.6 g/dL (ref 30.0–36.0)
MCV: 84.5 fL (ref 80.0–100.0)
Monocytes Absolute: 0.8 10*3/uL (ref 0.1–1.0)
Monocytes Relative: 4 %
Neutro Abs: 14.8 10*3/uL — ABNORMAL HIGH (ref 1.7–7.7)
Neutrophils Relative %: 84 %
Platelets: 362 10*3/uL (ref 150–400)
RBC: 4.4 MIL/uL (ref 3.87–5.11)
RDW: 17.3 % — ABNORMAL HIGH (ref 11.5–15.5)
WBC: 17.8 10*3/uL — ABNORMAL HIGH (ref 4.0–10.5)
nRBC: 0 % (ref 0.0–0.2)

## 2021-02-05 LAB — D-DIMER, QUANTITATIVE: D-Dimer, Quant: 0.39 ug/mL-FEU (ref 0.00–0.50)

## 2021-02-05 LAB — HEMOGLOBIN A1C
Hgb A1c MFr Bld: 5.9 % — ABNORMAL HIGH (ref 4.8–5.6)
Mean Plasma Glucose: 122.63 mg/dL

## 2021-02-05 LAB — UREA NITROGEN, URINE: Urea Nitrogen, Ur: 379 mg/dL

## 2021-02-05 LAB — CBG MONITORING, ED: Glucose-Capillary: 163 mg/dL — ABNORMAL HIGH (ref 70–99)

## 2021-02-05 LAB — BRAIN NATRIURETIC PEPTIDE: B Natriuretic Peptide: 60.6 pg/mL (ref 0.0–100.0)

## 2021-02-05 LAB — MAGNESIUM: Magnesium: 2.1 mg/dL (ref 1.7–2.4)

## 2021-02-05 LAB — C-REACTIVE PROTEIN: CRP: 1.8 mg/dL — ABNORMAL HIGH (ref <1.0)

## 2021-02-05 MED ORDER — ZOLPIDEM TARTRATE 5 MG PO TABS
5.0000 mg | ORAL_TABLET | Freq: Every evening | ORAL | Status: DC | PRN
  Administered 2021-02-05 – 2021-02-06 (×2): 5 mg via ORAL
  Filled 2021-02-05 (×2): qty 1

## 2021-02-05 MED ORDER — CLONIDINE HCL 0.1 MG PO TABS
0.3000 mg | ORAL_TABLET | Freq: Every day | ORAL | Status: DC
  Administered 2021-02-05: 0.3 mg via ORAL
  Filled 2021-02-05: qty 3

## 2021-02-05 MED ORDER — NICOTINE 21 MG/24HR TD PT24
21.0000 mg | MEDICATED_PATCH | TRANSDERMAL | Status: AC
Start: 2021-02-05 — End: 2021-02-07
  Administered 2021-02-05 – 2021-02-06 (×3): 21 mg via TRANSDERMAL
  Filled 2021-02-05 (×3): qty 1

## 2021-02-05 MED ORDER — IPRATROPIUM-ALBUTEROL 0.5-2.5 (3) MG/3ML IN SOLN
3.0000 mL | Freq: Two times a day (BID) | RESPIRATORY_TRACT | Status: DC
  Administered 2021-02-06 (×2): 3 mL via RESPIRATORY_TRACT
  Filled 2021-02-05 (×3): qty 3

## 2021-02-05 NOTE — ED Notes (Signed)
Boyfriend at the bedside has been outside smoking pot  the pt asked for a hhn after he returned into the room  she reports that she cannot smell it

## 2021-02-05 NOTE — Plan of Care (Signed)
Patient pain level will remain under pain score of 6/10 while in hospital.

## 2021-02-05 NOTE — ED Notes (Signed)
Pt up to the br again no assistance her boyfriend is spending the night on the  floor she reports that he does not have covid  yet

## 2021-02-05 NOTE — ED Notes (Signed)
Ambulatory to b/r, steady gait

## 2021-02-05 NOTE — Progress Notes (Signed)
PROGRESS NOTE                                                                                                                                                                                                             Patient Demographics:    April [name], is a 50 y.o. female, DOB - 12-09-1970, OIN:[id number]  Outpatient Primary MD for the patient is [name], MD    LOS - 1  Admit date - 02/03/2021    Chief Complaint  Patient presents with   Shortness of Breath       Brief Narrative (HPI from H&P)   April [name] is a 50 y.o. female with medical history significant of asthma, COPD with recurrent exacerbation, atrial fibrillation, anxiety, depression, CHF, GERD, hypertension, OSA, lumbar arthritis who presents with ongoing shortness of breath, was diagnosed with COPD exacerbation and admitted.   Subjective:   Patient in bed, appears comfortable, denies any headache, no fever, no chest pain or pressure, improved shortness of breath , no abdominal pain.      Assessment  & Plan :     Acute Hypoxic Resp. Failure due to Acute on Chr. COPD Exacerbation - over 10 days from Covid infection, stable CRP and CXR hence no active infection, for COPD Exacerbation, Azithromycin, IV Steroids, Nebs and o2.  Encouraged the patient to sit up in chair in the daytime use I-S and flutter valve for pulmonary toiletry.  Will advance activity and titrate down oxygen as possible, clinically better.  SpO2: 99 % O2 Flow Rate (L/min): 1.5 L/min  Recent Labs  Lab 02/03/21 1956 02/03/21 2015 02/03/21 2048 02/04/21 0355 02/04/21 0530 02/04/21 0922 02/05/21 0500 02/05/21 0620  WBC 13.7*  --   --   --  13.8*  --   --  17.8*  HGB 11.8*  --   --   --  11.9*  --   --  11.4*  HCT 38.4  --   --   --  38.6  --   --  37.2  PLT 352  --   --   --  360  --   --  362  CRP  --   --   --  3.9*  --   --   --  1.8*  BNP  --   --   --   --   --   --  60.6   --   DDIMER  --  0.47  --   --   --  0.36  --  0.39  PROCALCITON  --  <0.10  --   --   --   --   --   --   AST  --   --   --   --   --   --   --  14*  ALT  --   --   --   --   --   --   --  14  ALKPHOS  --   --   --   --   --   --   --  66  BILITOT  --   --   --   --   --   --   --  0.4  ALBUMIN  --   --   --   --   --   --   --  2.9*  LATICACIDVEN  --   --  0.9  --   --   --   --   --     2. Morbid Obesity - BMI > 40, OSA - OHS - follow with PCP for weight loss, outpt sleep study, o2 QHS.  3. H/O Parox. Afib - [place] VSAC 2 score 3 - Coreg, add Eliquis - was on it in the past.  4. Chronic Diastolic CHF EF 54% in 9826 - compensated, Coreg.  5. HTN - Coreg, Norvasc combination.  6. Smoking  - counseled to quit.  7. Dyslipidemia - Statin.  8. Anxiety - Depression  - Continue home duloxetine and as needed clonazepam.  9. Chronic Pain - Home Rx.  10. High Sugars - ? Steroids, A1c, ISS  Lab Results  Component Value Date   HGBA1C 5.6 (A) 03/24/2019    CBG (last 3)  Recent Labs    02/04/21 1143 02/04/21 1622 02/04/21 2156  GLUCAP 147* 170* 181*         Condition - Fair  Family Communication  : boy friend bedside 02/05/21  Code Status :  Full  Consults  :  None  PUD Prophylaxis : None   Procedures  :            Disposition Plan  :    Status is: Inpt  Dispo: The patient is from: Home              Anticipated d/c is to: Home              Patient currently is not medically stable to d/c.   Difficult to place patient No  DVT Prophylaxis  :     apixaban (ELIQUIS) tablet 5 mg    Lab Results  Component Value Date   PLT 362 02/05/2021    Diet :  Diet Order             Diet regular Room service appropriate? Yes; Fluid consistency: Thin  Diet effective now                    Inpatient Medications  Scheduled Meds:  amLODipine  10 mg Oral Daily   apixaban  5 mg Oral BID   atorvastatin  20 mg Oral Daily   azithromycin  250 mg Oral Daily    carvedilol  6.25 mg Oral BID WC   DULoxetine  60 mg Oral Daily   fluticasone furoate-vilanterol  1 puff Inhalation Daily   furosemide  40 mg Oral Daily   insulin aspart  0-15 Units Subcutaneous TID WC   insulin aspart  0-5 Units Subcutaneous QHS   ipratropium-albuterol  3 mL Nebulization QID   lisdexamfetamine  70 mg Oral Daily   methylPREDNISolone (SOLU-MEDROL) injection  40 mg Intravenous Q12H   montelukast  10 mg Oral QHS   nicotine  21 mg Transdermal Once   pantoprazole  40 mg Oral BID   pregabalin  300 mg Oral BID   sertraline  50 mg Oral QHS   sodium chloride flush  3 mL Intravenous Q12H   topiramate  100 mg Oral QHS   Continuous Infusions:   PRN Meds:.acetaminophen **OR** [DISCONTINUED] acetaminophen, albuterol, baclofen, clonazePAM, EPINEPHrine, hydrALAZINE, naloxone, oxyCODONE-acetaminophen, polyethylene glycol  Antibiotics  :    Anti-infectives (From admission, onward)    Start     Dose/Rate Route Frequency Ordered Stop   02/05/21 1000  azithromycin (ZITHROMAX) tablet 250 mg       See Hyperspace for full Linked Orders Report.   250 mg Oral Daily 02/04/21 0931 02/09/21 0959   02/04/21 1000  azithromycin (ZITHROMAX) tablet 500 mg       See Hyperspace for full Linked Orders Report.   500 mg Oral Daily 02/04/21 0931 02/04/21 1329        Time Spent in minutes  30   Lala Lund M.D on 02/05/2021 at 10:54 AM  To page go to [email or web address]   [facility] -  Office  [phone]    See all Orders from today for further details    Objective:   Vitals:   02/05/21 0845 02/05/21 0900 02/05/21 0915 02/05/21 0930  BP:      Pulse: 84 86 92 85  Resp: (!) 21 (!) 22 (!) 23 20  Temp:      TempSrc:      SpO2: 97% 97% 98% 99%  Weight:      Height:        Wt Readings from Last 3 Encounters:  02/03/21 (!) 148.3 kg  01/25/21 (!) 148.4 kg  01/22/21 (!) 147.5 kg    No intake or output data in the 24 hours ending 02/05/21 1054    Physical Exam  Awake  Alert, No new F.N deficits,   [place].AT,PERRAL Supple Neck, No JVD,   Symmetrical Chest wall movement, Good air movement bilaterally, CTAB RRR,    Abd Soft, No tenderness,   No Cyanosis, Clubbing or edema,         Data Review:    CBC Recent Labs  Lab 02/03/21 1956 02/04/21 0530 02/05/21 0620  WBC 13.7* 13.8* 17.8*  HGB 11.8* 11.9* 11.4*  HCT 38.4 38.6 37.2  PLT 352 360 362  MCV 83.8 84.3 84.5  MCH 25.8* 26.0 25.9*  MCHC 30.7 30.8 30.6  RDW 17.4* 17.5* 17.3*  LYMPHSABS 3.7  --  2.0  MONOABS 1.0  --  0.8  EOSABS 0.3  --  0.0  BASOSABS 0.0  --  0.0    Recent Labs  Lab 02/03/21 1956 02/03/21 2015 02/03/21 2048 02/04/21 0355 02/04/21 0922 02/05/21 0500 02/05/21 0620  NA 137  --   --  132*  --   --  136  K 3.9  --   --  3.9  --   --  4.0  CL 106  --   --  103  --   --  104  CO2 24  --   --  23  --   --  24  GLUCOSE 137*  --   --  256*  --   --  166*  BUN 6  --   --  10  --   --  13  CREATININE 0.71  --   --  0.77  --   --  0.82  CALCIUM 8.1*  --   --  8.0*  --   --  8.6*  AST  --   --   --   --   --   --  14*  ALT  --   --   --   --   --   --  14  ALKPHOS  --   --   --   --   --   --  66  BILITOT  --   --   --   --   --   --  0.4  ALBUMIN  --   --   --   --   --   --  2.9*  MG  --   --   --   --   --   --  2.1  CRP  --   --   --  3.9*  --   --  1.8*  DDIMER  --  0.47  --   --  0.36  --  0.39  PROCALCITON  --  <0.10  --   --   --   --   --   LATICACIDVEN  --   --  0.9  --   --   --   --   BNP  --   --   --   --   --  60.6  --     ------------------------------------------------------------------------------------------------------------------ Recent Labs    02/03/21 2015  TRIG 101    Lab Results  Component Value Date   HGBA1C 5.6 (A) 03/24/2019   ------------------------------------------------------------------------------------------------------------------ No results for input(s): TSH, T4TOTAL, T3FREE, THYROIDAB in the last 72 hours.  Invalid  input(s): FREET3  Cardiac Enzymes No results for input(s): CKMB, TROPONINI, MYOGLOBIN in the last 168 hours.  Invalid input(s): CK ------------------------------------------------------------------------------------------------------------------    Component Value Date/Time   BNP 60.6 02/05/2021 0500     Radiology Reports DG Chest 1 View  Result Date: 02/03/2021 CLINICAL DATA:  Dyspnea, COVID pneumonia EXAM: CHEST  1 VIEW COMPARISON:  01/25/2021 FINDINGS: The heart size and mediastinal contours are within normal limits. Both lungs are clear. The visualized skeletal structures are unremarkable. IMPRESSION: No active disease. Electronically Signed   By: Fidela Salisbury M.D.   On: 02/03/2021 19:36   DG Chest Port 1 View  Result Date: 02/05/2021 CLINICAL DATA:  Shortness of breath.  COVID positive. EXAM: PORTABLE CHEST 1 VIEW COMPARISON:  Chest radiograph, 02/03/2021.  CT chest, 10/08/2017. FINDINGS: Cardiac silhouette is within normal limits. The lungs are hypoinflated. Relatively clear RIGHT chest, with minimal streaky middle lobe opacities. LEFT basilar hazy opacity. No discrete consolidation. No pleural effusion or pneumothorax. No acute osseous abnormality. IMPRESSION: Hypoinflation with minimal basilar atelectasis. Electronically Signed   By: Michaelle Birks M.D.   On: 02/05/2021 07:51   DG Chest Port 1 View  Result Date: 01/25/2021 CLINICAL DATA:  Asthma exacerbation.  Shortness of breath. EXAM: PORTABLE CHEST 1 VIEW COMPARISON:  08/12/2020 FINDINGS: The cardiac silhouette, mediastinal and hilar contours are within normal limits and stable. Chronic emphysematous and bronchitic type lung changes but no definite infiltrates or effusions. IMPRESSION: Chronic lung changes but no acute pulmonary findings. Electronically Signed  By: Marijo Sanes M.D.   On: 01/25/2021 18:36

## 2021-02-06 ENCOUNTER — Inpatient Hospital Stay (HOSPITAL_COMMUNITY): Payer: Medicare Other

## 2021-02-06 LAB — CBC WITH DIFFERENTIAL/PLATELET
Abs Immature Granulocytes: 0.23 10*3/uL — ABNORMAL HIGH (ref 0.00–0.07)
Basophils Absolute: 0 10*3/uL (ref 0.0–0.1)
Basophils Relative: 0 %
Eosinophils Absolute: 0 10*3/uL (ref 0.0–0.5)
Eosinophils Relative: 0 %
HCT: 40.7 % (ref 36.0–46.0)
Hemoglobin: 12.7 g/dL (ref 12.0–15.0)
Immature Granulocytes: 1 %
Lymphocytes Relative: 10 %
Lymphs Abs: 2 10*3/uL (ref 0.7–4.0)
MCH: 26 pg (ref 26.0–34.0)
MCHC: 31.2 g/dL (ref 30.0–36.0)
MCV: 83.2 fL (ref 80.0–100.0)
Monocytes Absolute: 0.9 10*3/uL (ref 0.1–1.0)
Monocytes Relative: 4 %
Neutro Abs: 17.7 10*3/uL — ABNORMAL HIGH (ref 1.7–7.7)
Neutrophils Relative %: 85 %
Platelets: 425 10*3/uL — ABNORMAL HIGH (ref 150–400)
RBC: 4.89 MIL/uL (ref 3.87–5.11)
RDW: 17.5 % — ABNORMAL HIGH (ref 11.5–15.5)
WBC: 20.8 10*3/uL — ABNORMAL HIGH (ref 4.0–10.5)
nRBC: 0 % (ref 0.0–0.2)

## 2021-02-06 LAB — C-REACTIVE PROTEIN: CRP: 1.3 mg/dL — ABNORMAL HIGH (ref <1.0)

## 2021-02-06 LAB — COMPREHENSIVE METABOLIC PANEL
ALT: 19 U/L (ref 0–44)
AST: 19 U/L (ref 15–41)
Albumin: 3.4 g/dL — ABNORMAL LOW (ref 3.5–5.0)
Alkaline Phosphatase: 77 U/L (ref 38–126)
Anion gap: 7 (ref 5–15)
BUN: 19 mg/dL (ref 6–20)
CO2: 25 mmol/L (ref 22–32)
Calcium: 8.8 mg/dL — ABNORMAL LOW (ref 8.9–10.3)
Chloride: 104 mmol/L (ref 98–111)
Creatinine, Ser: 1 mg/dL (ref 0.44–1.00)
GFR, Estimated: >60 mL/min (ref >60)
Glucose, Bld: 157 mg/dL — ABNORMAL HIGH (ref 70–99)
Potassium: 3.7 mmol/L (ref 3.5–5.1)
Sodium: 136 mmol/L (ref 135–145)
Total Bilirubin: 0.4 mg/dL (ref 0.3–1.2)
Total Protein: 7.6 g/dL (ref 6.5–8.1)

## 2021-02-06 LAB — BRAIN NATRIURETIC PEPTIDE: B Natriuretic Peptide: 30.2 pg/mL (ref 0.0–100.0)

## 2021-02-06 LAB — GLUCOSE, CAPILLARY
Glucose-Capillary: 180 mg/dL — ABNORMAL HIGH (ref 70–99)
Glucose-Capillary: 132 mg/dL — ABNORMAL HIGH (ref 70–99)
Glucose-Capillary: 139 mg/dL — ABNORMAL HIGH (ref 70–99)
Glucose-Capillary: 140 mg/dL — ABNORMAL HIGH (ref 70–99)

## 2021-02-06 LAB — MAGNESIUM: Magnesium: 2.2 mg/dL (ref 1.7–2.4)

## 2021-02-06 LAB — D-DIMER, QUANTITATIVE: D-Dimer, Quant: 0.3 ug/mL-FEU (ref 0.00–0.50)

## 2021-02-06 MED ORDER — POTASSIUM CHLORIDE CRYS ER 20 MEQ PO TBCR
40.0000 meq | EXTENDED_RELEASE_TABLET | Freq: Once | ORAL | Status: AC
  Administered 2021-02-06: 40 meq via ORAL
  Filled 2021-02-06: qty 2

## 2021-02-06 MED ORDER — CLONIDINE HCL 0.2 MG PO TABS
0.2000 mg | ORAL_TABLET | Freq: Two times a day (BID) | ORAL | Status: DC
  Administered 2021-02-06 – 2021-02-07 (×3): 0.2 mg via ORAL
  Filled 2021-02-06 (×3): qty 1

## 2021-02-06 MED ORDER — NICOTINE 21 MG/24HR TD PT24
21.0000 mg | MEDICATED_PATCH | Freq: Every day | TRANSDERMAL | Status: DC
  Administered 2021-02-06 – 2021-02-07 (×2): 21 mg via TRANSDERMAL
  Filled 2021-02-06 (×2): qty 1

## 2021-02-06 MED ORDER — INFLUENZA VAC SPLIT QUAD 0.5 ML IM SUSY
0.5000 mL | PREFILLED_SYRINGE | INTRAMUSCULAR | Status: AC
  Administered 2021-02-07: 0.5 mL via INTRAMUSCULAR
  Filled 2021-02-06: qty 0.5

## 2021-02-06 MED ORDER — FUROSEMIDE 10 MG/ML IJ SOLN: 20.0000 mg | Freq: Once | INTRAMUSCULAR | Status: DC

## 2021-02-06 MED ORDER — FUROSEMIDE 10 MG/ML IJ SOLN
40.0000 mg | Freq: Once | INTRAMUSCULAR | Status: AC
  Administered 2021-02-06: 40 mg via INTRAVENOUS
  Filled 2021-02-06: qty 4

## 2021-02-06 NOTE — Progress Notes (Signed)
PROGRESS NOTE                                                                                                                                                                                                             Patient Demographics:    April [name], is a 50 y.o. female, DOB - 08/03/1970, HUD:[id number]  Outpatient Primary MD for the patient is [name], MD    LOS - 2  Admit date - 02/03/2021    Chief Complaint  Patient presents with   Shortness of Breath       Brief Narrative (HPI from H&P)   April [name] is a 50 y.o. female with medical history significant of asthma, COPD with recurrent exacerbation, atrial fibrillation, anxiety, depression, CHF, GERD, hypertension, OSA, lumbar arthritis who presents with ongoing shortness of breath, was diagnosed with COPD exacerbation and admitted.   Subjective:   Patient in bed, appears comfortable, denies any headache, no fever, no chest pain or pressure, no shortness of breath , no abdominal pain. No new focal weakness.   Assessment  & Plan :     Acute Hypoxic Resp. Failure due to Acute on Chr. COPD Exacerbation - over 10 days from Covid infection, stable CRP and CXR hence no active infection, for COPD Exacerbation, Azithromycin, IV Steroids, Nebs and o2.  Encouraged the patient to sit up in chair in the daytime use I-S and flutter valve for pulmonary toiletry.  Overall clinically feels much better but still does not feel like she is good enough to go home, continue treatment for another day advance activity and titrate down oxygen .  SpO2: 96 % O2 Flow Rate (L/min): 3 L/min  Recent Labs  Lab 02/03/21 1956 02/03/21 2015 02/03/21 2048 02/04/21 0355 02/04/21 0530 02/04/21 0922 02/05/21 0500 02/05/21 0620 02/06/21 0037  WBC 13.7*  --   --   --  13.8*  --   --  17.8* 20.8*  HGB 11.8*  --   --   --  11.9*  --   --  11.4* 12.7  HCT 38.4  --   --   --  38.6  --   --   37.2 40.7  PLT 352  --   --   --  360  --   --  362 425*  CRP  --   --   --  3.9*  --   --   --  1.8* 1.3*  BNP  --   --   --   --   --   --  60.6  --  30.2  DDIMER  --  0.47  --   --   --  0.36  --  0.39 0.30  PROCALCITON  --  <0.10  --   --   --   --   --   --   --   AST  --   --   --   --   --   --   --  14* 19  ALT  --   --   --   --   --   --   --  14 19  ALKPHOS  --   --   --   --   --   --   --  66 77  BILITOT  --   --   --   --   --   --   --  0.4 0.4  ALBUMIN  --   --   --   --   --   --   --  2.9* 3.4*  LATICACIDVEN  --   --  0.9  --   --   --   --   --   --     2. Morbid Obesity - BMI > 40, OSA - OHS - follow with PCP for weight loss, outpt sleep study, o2 QHS.  3. H/O Parox. Afib - [place] VSAC 2 score 3 - Coreg, add Eliquis - was on it in the past.  4. Chronic Diastolic CHF EF 54% in 4920 - compensated, Coreg.  5. HTN - Coreg, Norvasc, changed Catapres dose for better control.  6. Smoking  - counseled to quit.  7. Dyslipidemia - Statin.  8. Anxiety - Depression  - Continue home duloxetine and as needed clonazepam.  9. Chronic Pain - Home Rx.  10. High Sugars - ? Steroids, A1c, ISS  Lab Results  Component Value Date   HGBA1C 5.9 (H) 02/05/2021    CBG (last 3)  Recent Labs    02/05/21 1601 02/05/21 2027 02/06/21 0734  GLUCAP 119* 176* 180*         Condition - Fair  Family Communication  : boy friend bedside 02/05/21  Code Status :  Full  Consults  :  None  PUD Prophylaxis : None   Procedures  :            Disposition Plan  :    Status is: Inpt  Dispo: The patient is from: Home              Anticipated d/c is to: Home              Patient currently is not medically stable to d/c.   Difficult to place patient No  DVT Prophylaxis  :     apixaban (ELIQUIS) tablet 5 mg    Lab Results  Component Value Date   PLT 425 (H) 02/06/2021    Diet :  Diet Order             Diet regular Room service appropriate? Yes; Fluid consistency:  Thin  Diet effective now                    Inpatient Medications  Scheduled Meds:  amLODipine  10 mg Oral Daily   apixaban  5 mg Oral BID   atorvastatin  20 mg Oral Daily   azithromycin  250 mg Oral Daily   carvedilol  6.25 mg Oral BID WC   cloNIDine  0.2 mg Oral BID   DULoxetine  60 mg Oral Daily   fluticasone furoate-vilanterol  1 puff Inhalation Daily   furosemide  40 mg Intravenous Once   furosemide  40 mg Oral Daily   [START ON 02/07/2021] influenza vac split quadrivalent PF  0.5 mL Intramuscular Tomorrow-1000   insulin aspart  0-15 Units Subcutaneous TID WC   insulin aspart  0-5 Units Subcutaneous QHS   ipratropium-albuterol  3 mL Nebulization BID   lisdexamfetamine  70 mg Oral Daily   methylPREDNISolone (SOLU-MEDROL) injection  40 mg Intravenous Q12H   montelukast  10 mg Oral QHS   nicotine  21 mg Transdermal Q24 Hr x 2   pantoprazole  40 mg Oral BID   potassium chloride  40 mEq Oral Once   pregabalin  300 mg Oral BID   sertraline  50 mg Oral QHS   sodium chloride flush  3 mL Intravenous Q12H   topiramate  100 mg Oral QHS   Continuous Infusions:   PRN Meds:.acetaminophen **OR** [DISCONTINUED] acetaminophen, albuterol, baclofen, EPINEPHrine, hydrALAZINE, naloxone, oxyCODONE-acetaminophen, polyethylene glycol, zolpidem  Antibiotics  :    Anti-infectives (From admission, onward)    Start     Dose/Rate Route Frequency Ordered Stop   02/05/21 1000  azithromycin (ZITHROMAX) tablet 250 mg       See Hyperspace for full Linked Orders Report.   250 mg Oral Daily 02/04/21 0931 02/09/21 0959   02/04/21 1000  azithromycin (ZITHROMAX) tablet 500 mg       See Hyperspace for full Linked Orders Report.   500 mg Oral Daily 02/04/21 0931 02/04/21 1329        Time Spent in minutes  30   Lala Lund M.D on 02/06/2021 at 10:07 AM  To page go to [email or web address]   [facility] -  Office  [phone]    See all Orders from today for further details     Objective:   Vitals:   02/06/21 0730 02/06/21 0818 02/06/21 0821 02/06/21 0942  BP: (!) 159/91     Pulse: 93     Resp: 20     Temp: 98.3 F (36.8 C)     TempSrc: Oral     SpO2: 96% 96% 97% 96%  Weight:      Height:        Wt Readings from Last 3 Encounters:  02/06/21 (!) 148 kg  01/25/21 (!) 148.4 kg  01/22/21 (!) 147.5 kg     Intake/Output Summary (Last 24 hours) at 02/06/2021 1007 Last data filed at 02/06/2021 0929 Gross per 24 hour  Intake 3 ml  Output --  Net 3 ml      Physical Exam  Awake Alert, No new F.N deficits, Normal affect [place].AT,PERRAL Supple Neck, No JVD,   Symmetrical Chest wall movement, Good air movement bilaterally, mild upper airway wheezing RRR,No Gallops, Rubs or new Murmurs, No Parasternal Heave +ve B.Sounds, Abd Soft, No tenderness,   No Cyanosis, Clubbing or edema,          Data Review:    CBC Recent Labs  Lab 02/03/21 1956 02/04/21 0530 02/05/21 0620 02/06/21 0037  WBC 13.7* 13.8* 17.8* 20.8*  HGB 11.8* 11.9* 11.4* 12.7  HCT 38.4 38.6 37.2 40.7  PLT 352 360 362 425*  MCV 83.8 84.3 84.5 83.2  MCH 25.8* 26.0 25.9* 26.0  MCHC 30.7 30.8 30.6 31.2  RDW 17.4* 17.5* 17.3* 17.5*  LYMPHSABS 3.7  --  2.0 2.0  MONOABS 1.0  --  0.8 0.9  EOSABS 0.3  --  0.0 0.0  BASOSABS 0.0  --  0.0 0.0    Recent Labs  Lab 02/03/21 1956 02/03/21 2015 02/03/21 2048 02/04/21 0355 02/04/21 0922 02/05/21 0500 02/05/21 0620 02/05/21 1644 02/06/21 0037  NA 137  --   --  132*  --   --  136  --  136  K 3.9  --   --  3.9  --   --  4.0  --  3.7  CL 106  --   --  103  --   --  104  --  104  CO2 24  --   --  23  --   --  24  --  25  GLUCOSE 137*  --   --  256*  --   --  166*  --  157*  BUN 6  --   --  10  --   --  13  --  19  CREATININE 0.71  --   --  0.77  --   --  0.82  --  1.00  CALCIUM 8.1*  --   --  8.0*  --   --  8.6*  --  8.8*  AST  --   --   --   --   --   --  14*  --  19  ALT  --   --   --   --   --   --  14  --  19  ALKPHOS  --   --    --   --   --   --  66  --  77  BILITOT  --   --   --   --   --   --  0.4  --  0.4  ALBUMIN  --   --   --   --   --   --  2.9*  --  3.4*  MG  --   --   --   --   --   --  2.1  --  2.2  CRP  --   --   --  3.9*  --   --  1.8*  --  1.3*  DDIMER  --  0.47  --   --  0.36  --  0.39  --  0.30  PROCALCITON  --  <0.10  --   --   --   --   --   --   --   LATICACIDVEN  --   --  0.9  --   --   --   --   --   --   HGBA1C  --   --   --   --   --   --   --  5.9*  --   BNP  --   --   --   --   --  60.6  --   --  30.2    ------------------------------------------------------------------------------------------------------------------ Recent Labs    02/03/21 2015  TRIG 101    Lab Results  Component Value Date   HGBA1C 5.9 (H) 02/05/2021   ------------------------------------------------------------------------------------------------------------------ No results for input(s): TSH, T4TOTAL, T3FREE, THYROIDAB in the last 72 hours.  Invalid input(s): FREET3  Cardiac Enzymes No results for input(s): CKMB,  TROPONINI, MYOGLOBIN in the last 168 hours.  Invalid input(s): CK ------------------------------------------------------------------------------------------------------------------    Component Value Date/Time   BNP 30.2 02/06/2021 0037     Radiology Reports DG Chest 1 View  Result Date: 02/03/2021 CLINICAL DATA:  Dyspnea, COVID pneumonia EXAM: CHEST  1 VIEW COMPARISON:  01/25/2021 FINDINGS: The heart size and mediastinal contours are within normal limits. Both lungs are clear. The visualized skeletal structures are unremarkable. IMPRESSION: No active disease. Electronically Signed   By: Fidela Salisbury M.D.   On: 02/03/2021 19:36   DG Chest Port 1 View  Result Date: 02/06/2021 CLINICAL DATA:  Shortness of breath. Recent COVID infection. EXAM: PORTABLE CHEST 1 VIEW COMPARISON:  02/05/2021 FINDINGS: The cardiac silhouette, mediastinal and hilar contours are within normal limits. The lungs are  clear. No infiltrates, edema or effusions. The bony thorax is intact. IMPRESSION: No acute cardiopulmonary findings. Electronically Signed   By: Marijo Sanes M.D.   On: 02/06/2021 08:37   DG Chest Port 1 View  Result Date: 02/05/2021 CLINICAL DATA:  Shortness of breath.  COVID positive. EXAM: PORTABLE CHEST 1 VIEW COMPARISON:  Chest radiograph, 02/03/2021.  CT chest, 10/08/2017. FINDINGS: Cardiac silhouette is within normal limits. The lungs are hypoinflated. Relatively clear RIGHT chest, with minimal streaky middle lobe opacities. LEFT basilar hazy opacity. No discrete consolidation. No pleural effusion or pneumothorax. No acute osseous abnormality. IMPRESSION: Hypoinflation with minimal basilar atelectasis. Electronically Signed   By: Michaelle Birks M.D.   On: 02/05/2021 07:51   DG Chest Port 1 View  Result Date: 01/25/2021 CLINICAL DATA:  Asthma exacerbation.  Shortness of breath. EXAM: PORTABLE CHEST 1 VIEW COMPARISON:  08/12/2020 FINDINGS: The cardiac silhouette, mediastinal and hilar contours are within normal limits and stable. Chronic emphysematous and bronchitic type lung changes but no definite infiltrates or effusions. IMPRESSION: Chronic lung changes but no acute pulmonary findings. Electronically Signed   By: Marijo Sanes M.D.   On: 01/25/2021 18:36

## 2021-02-06 NOTE — Progress Notes (Signed)
97SATURATION QUALIFICATIONS: (This note is used to comply with regulatory documentation for home oxygen)  Patient Saturations on Room Air at Rest = 97%  Patient Saturations on Room Air while Ambulating = 95%  Patient Saturations on 0 Liters of oxygen while Ambulating = 95%  Please briefly explain why patient needs home oxygen:

## 2021-02-07 ENCOUNTER — Other Ambulatory Visit (HOSPITAL_COMMUNITY): Payer: Self-pay

## 2021-02-07 LAB — COMPREHENSIVE METABOLIC PANEL
ALT: 27 U/L (ref 0–44)
AST: 21 U/L (ref 15–41)
Albumin: 3.1 g/dL — ABNORMAL LOW (ref 3.5–5.0)
Alkaline Phosphatase: 72 U/L (ref 38–126)
Anion gap: 8 (ref 5–15)
BUN: 27 mg/dL — ABNORMAL HIGH (ref 6–20)
CO2: 24 mmol/L (ref 22–32)
Calcium: 8.6 mg/dL — ABNORMAL LOW (ref 8.9–10.3)
Chloride: 100 mmol/L (ref 98–111)
Creatinine, Ser: 1.13 mg/dL — ABNORMAL HIGH (ref 0.44–1.00)
GFR, Estimated: >60 mL/min (ref >60)
Glucose, Bld: 168 mg/dL — ABNORMAL HIGH (ref 70–99)
Potassium: 4 mmol/L (ref 3.5–5.1)
Sodium: 132 mmol/L — ABNORMAL LOW (ref 135–145)
Total Bilirubin: 0.7 mg/dL (ref 0.3–1.2)
Total Protein: 6.9 g/dL (ref 6.5–8.1)

## 2021-02-07 LAB — CBC WITH DIFFERENTIAL/PLATELET
Abs Immature Granulocytes: 0.23 10*3/uL — ABNORMAL HIGH (ref 0.00–0.07)
Basophils Absolute: 0 10*3/uL (ref 0.0–0.1)
Basophils Relative: 0 %
Eosinophils Absolute: 0 10*3/uL (ref 0.0–0.5)
Eosinophils Relative: 0 %
HCT: 39 % (ref 36.0–46.0)
Hemoglobin: 12 g/dL (ref 12.0–15.0)
Immature Granulocytes: 1 %
Lymphocytes Relative: 11 %
Lymphs Abs: 2.1 10*3/uL (ref 0.7–4.0)
MCH: 25.8 pg — ABNORMAL LOW (ref 26.0–34.0)
MCHC: 30.8 g/dL (ref 30.0–36.0)
MCV: 83.7 fL (ref 80.0–100.0)
Monocytes Absolute: 1.3 10*3/uL — ABNORMAL HIGH (ref 0.1–1.0)
Monocytes Relative: 7 %
Neutro Abs: 14.8 10*3/uL — ABNORMAL HIGH (ref 1.7–7.7)
Neutrophils Relative %: 81 %
Platelets: 400 10*3/uL (ref 150–400)
RBC: 4.66 MIL/uL (ref 3.87–5.11)
RDW: 17.2 % — ABNORMAL HIGH (ref 11.5–15.5)
WBC: 18.4 10*3/uL — ABNORMAL HIGH (ref 4.0–10.5)
nRBC: 0 % (ref 0.0–0.2)

## 2021-02-07 LAB — GLUCOSE, CAPILLARY: Glucose-Capillary: 175 mg/dL — ABNORMAL HIGH (ref 70–99)

## 2021-02-07 LAB — C-REACTIVE PROTEIN: CRP: 0.6 mg/dL (ref <1.0)

## 2021-02-07 LAB — BRAIN NATRIURETIC PEPTIDE: B Natriuretic Peptide: 30.8 pg/mL (ref 0.0–100.0)

## 2021-02-07 LAB — MAGNESIUM: Magnesium: 2.1 mg/dL (ref 1.7–2.4)

## 2021-02-07 LAB — D-DIMER, QUANTITATIVE: D-Dimer, Quant: 0.31 ug/mL-FEU (ref 0.00–0.50)

## 2021-02-07 MED ORDER — FUROSEMIDE 40 MG PO TABS: 40.0000 mg | ORAL_TABLET | Freq: Every day | ORAL | Status: DC

## 2021-02-07 MED ORDER — APIXABAN 5 MG PO TABS: 5.0000 mg | ORAL_TABLET | Freq: Two times a day (BID) | ORAL | 0 refills | Status: DC

## 2021-02-07 MED ORDER — IPRATROPIUM-ALBUTEROL 0.5-2.5 (3) MG/3ML IN SOLN
3.0000 mL | Freq: Two times a day (BID) | RESPIRATORY_TRACT | Status: DC
  Administered 2021-02-07: 3 mL via RESPIRATORY_TRACT
  Filled 2021-02-07: qty 3

## 2021-02-07 MED ORDER — CLONIDINE HCL 0.2 MG PO TABS: 0.2000 mg | ORAL_TABLET | Freq: Two times a day (BID) | ORAL | 0 refills | Status: DC

## 2021-02-07 MED ORDER — CARVEDILOL 6.25 MG PO TABS: 6.2500 mg | ORAL_TABLET | Freq: Two times a day (BID) | ORAL | 0 refills | Status: DC

## 2021-02-07 MED ORDER — AMLODIPINE BESYLATE 10 MG PO TABS: 10.0000 mg | ORAL_TABLET | Freq: Every day | ORAL | 0 refills | Status: DC

## 2021-02-07 MED ORDER — AZITHROMYCIN 250 MG PO TABS: 250.0000 mg | ORAL_TABLET | Freq: Every day | ORAL | 0 refills | Status: DC

## 2021-02-07 MED ORDER — PREDNISONE 5 MG PO TABS: ORAL_TABLET | ORAL | 0 refills | Status: DC

## 2021-02-07 MED ORDER — IPRATROPIUM-ALBUTEROL 0.5-2.5 (3) MG/3ML IN SOLN
3.0000 mL | Freq: Four times a day (QID) | RESPIRATORY_TRACT | Status: DC
  Administered 2021-02-07: 3 mL via RESPIRATORY_TRACT
  Filled 2021-02-07: qty 3

## 2021-02-07 NOTE — TOC Transition Note (Signed)
Transition of Care [facility]) - CM/SW Discharge Note   Patient Details  Name: April [name] MRN: [id number] Date of Birth: [birth year]  [facility]) CM/SW Contact:  Cyndi Bender, RN Phone Number: 02/07/2021, 9:49 AM   Clinical Narrative:    Patient stable for discharge. Spoke to patient about using oxygen at night. Patient agrees with plan. Called Jermaine with [employer]. The oxygen will be delivered to her home. Address, Phone number and PCP verified. Patient has transportation home   Final next level of care: Home/Self Care Barriers to Discharge: Barriers Resolved   Patient Goals and CMS Choice Patient states their goals for this hospitalization and ongoing recovery are:: return home      Discharge Placement             Home with nocturnal oxygen           Discharge Plan and Services   Discharge Planning Services: CM Consult Post Acute Care Choice: Durable Medical Equipment          DME Arranged: Oxygen DME Agency: [employer] Date DME Agency Contacted: 02/07/21 Time DME Agency Contacted: [phone] Representative spoke with at DME Agency: [place] Determinants of Health ([place]) Interventions     Readmission Risk Interventions Readmission Risk Prevention Plan 02/07/2021  Transportation Screening Complete  Medication Review [employer]) Complete  PCP or Specialist appointment within 3-5 days of discharge Complete  HRI or Home Care Consult Patient refused  SW Recovery Care/Counseling Consult Patient refused  Palliative Care Screening Not [place] Not Applicable  Some recent data might be hidden

## 2021-02-07 NOTE — Discharge Instructions (Addendum)
Follow with Primary MD [name], MD in 7 days   Get CBC, CMP, 2 view Chest X ray -  checked next visit within 1 week by Primary MD    Activity: As tolerated with Full fall precautions use walker/cane & assistance as needed  Disposition Home    Diet: Heart Healthy    Special Instructions: If you have smoked or chewed Tobacco  in the last 2 yrs please stop smoking, stop any regular Alcohol  and or any Recreational drug use.  On your next visit with your primary care physician please Get Medicines reviewed and adjusted.  Please request your Prim.MD to go over all Hospital Tests and Procedure/Radiological results at the follow up, please get all Hospital records sent to your Prim MD by signing hospital release before you go home.  If you experience worsening of your admission symptoms, develop shortness of breath, life threatening emergency, suicidal or homicidal thoughts you must seek medical attention immediately by calling 911 or calling your MD immediately  if symptoms less severe.  You Must read complete instructions/literature along with all the possible adverse reactions/side effects for all the Medicines you take and that have been prescribed to you. Take any new Medicines after you have completely understood and accpet all the possible adverse reactions/side effects.   Information on my medicine - ELIQUIS (apixaban)  This medication education was reviewed with me or my healthcare representative as part of my discharge preparation.  The pharmacist that spoke with me during my hospital stay was:    Why was Eliquis prescribed for you? Eliquis was prescribed for you to reduce the risk of a blood clot forming that can cause a stroke if you have a medical condition called atrial fibrillation (a type of irregular heartbeat).  What do You need to know about Eliquis ? Take your Eliquis TWICE DAILY - one tablet in the morning and one tablet in the evening with or without food. If  you have difficulty swallowing the tablet whole please discuss with your pharmacist how to take the medication safely.  Take Eliquis exactly as prescribed by your doctor and DO NOT stop taking Eliquis without talking to the doctor who prescribed the medication.  Stopping may increase your risk of developing a stroke.  Refill your prescription before you run out.  After discharge, you should have regular check-up appointments with your healthcare provider that is prescribing your Eliquis.  In the future your dose may need to be changed if your kidney function or weight changes by a significant amount or as you get older.  What do you do if you miss a dose? If you miss a dose, take it as soon as you remember on the same day and resume taking twice daily.  Do not take more than one dose of ELIQUIS at the same time to make up a missed dose.  Important Safety Information A possible side effect of Eliquis is bleeding. You should call your healthcare provider right away if you experience any of the following: Bleeding from an injury or your nose that does not stop. Unusual colored urine (red or dark brown) or unusual colored stools (red or black). Unusual bruising for unknown reasons. A serious fall or if you hit your head (even if there is no bleeding).  Some medicines may interact with Eliquis and might increase your risk of bleeding or clotting while on Eliquis. To help avoid this, consult your healthcare provider or pharmacist prior to using any new prescription  or non-prescription medications, including herbals, vitamins, non-steroidal anti-inflammatory drugs (NSAIDs) and supplements.  This website has more information on Eliquis (apixaban): http://www.eliquis.com/eliquis/home

## 2021-02-07 NOTE — Discharge Summary (Signed)
April [name] WAQ:[id number] DOB: Sep 09, 1970 DOA: 02/03/2021  PCP: [name], MD  Admit date: 02/03/2021  Discharge date: 02/07/2021  Admitted From: Home   Disposition:  Home   Recommendations for Outpatient Follow-up:   Follow up with PCP in 1-2 weeks  PCP Please obtain BMP/CBC, 2 view CXR in 1week,  (see Discharge instructions)   PCP Please follow up on the following pending results: She needs outpatient sleep study, please make sure she is compliant with her Eliquis.   Home Health: None   Equipment/Devices: QHS o2 - Pox drops < 88% for > 5 mins while asleep.  Consultations: None  Discharge Condition: Stable    CODE STATUS: Full    Diet Recommendation: Heart Healthy   Diet Order             Diet - low sodium heart healthy           Diet regular Room service appropriate? Yes; Fluid consistency: Thin  Diet effective now                    Chief Complaint  Patient presents with   Shortness of Breath     Brief history of present illness from the day of admission and additional interim summary     April [name] is a 50 y.o. female with medical history significant of asthma, COPD with recurrent exacerbation, atrial fibrillation, anxiety, depression, CHF, GERD, hypertension, OSA, lumbar arthritis who presents with ongoing shortness of breath, was diagnosed with COPD exacerbation and admitted.                                                                  Hospital Course    Acute Hypoxic Resp. Failure due to Acute on Chr. COPD Exacerbation - over 10 days from Covid infection, stable CRP and CXR showed no active infection, was treated for COPD Exacerbation with Azithromycin, Steroids, Nebs and o2.  Encouraged the patient to sit up in chair in the daytime use I-S and flutter valve for pulmonary  toiletry.  Overall clinically feels much better and stable on RA upon ambulation, will DC home on Steroid taper, Azithro, PCP follow up, counseled to quit smoking. QHS o2 - Pox drops < 88% for > 5 mins while asleep.    2. Morbid Obesity - BMI > 40, OSA - OHS - follow with PCP for weight loss, outpt sleep study, o2 QHS.   3. H/O Parox. Afib - [place] VSAC 2 score 3 - Coreg, add Eliquis - was on it in the past.  She has been counseled on Eliquis compliance, she has over 50 pills at home according to the patient, she says she just stopped taking it and will now resume.   4. Chronic Diastolic CHF EF 59% in 4707 -  compensated, Coreg.   5. HTN - Coreg, Norvasc, changed Catapres dose for better control.   6. Smoking  - counseled to quit.   7. Dyslipidemia - Statin.   8. Anxiety - Depression  - Continue home duloxetine and as needed clonazepam.   9. Chronic Pain - Home Rx.   10. High Sugars - due to steroids  Lab Results  Component Value Date   HGBA1C 5.9 (H) 02/05/2021     Discharge diagnosis     Principal Problem:   COPD exacerbation ([place]) Active Problems:   HTN (hypertension)   GERD (gastroesophageal reflux disease)   Anxiety and depression   AF (paroxysmal atrial fibrillation) (HCC)   Acute respiratory failure with hypoxia (H[facility])   Obstructive sleep apnea   COPD with exacerbation ([place])    Discharge instructions    Discharge Instructions     Diet - low sodium heart healthy   Complete by: As directed    Discharge instructions   Complete by: As directed    Follow with Primary MD [name], MD in 7 days   Get CBC, CMP, 2 view Chest X ray -  checked next visit within 1 week by Primary MD    Activity: As tolerated with Full fall precautions use walker/cane & assistance as needed  Disposition Home    Diet: Heart Healthy    Special Instructions: If you have smoked or chewed Tobacco  in the last 2 yrs please stop smoking, stop any regular Alcohol  and or any  Recreational drug use.  On your next visit with your primary care physician please Get Medicines reviewed and adjusted.  Please request your Prim.MD to go over all Hospital Tests and Procedure/Radiological results at the follow up, please get all Hospital records sent to your Prim MD by signing hospital release before you go home.  If you experience worsening of your admission symptoms, develop shortness of breath, life threatening emergency, suicidal or homicidal thoughts you must seek medical attention immediately by calling 911 or calling your MD immediately  if symptoms less severe.  You Must read complete instructions/literature along with all the possible adverse reactions/side effects for all the Medicines you take and that have been prescribed to you. Take any new Medicines after you have completely understood and accpet all the possible adverse reactions/side effects.   Increase activity slowly   Complete by: As directed        Discharge Medications   Allergies as of 02/07/2021       Reactions   Contrast Media [iodinated Diagnostic Agents] Itching   Ct contrast        Medication List     STOP taking these medications    hydrALAZINE 10 MG tablet Commonly known as: APRESOLINE   metoprolol tartrate 25 MG tablet Commonly known as: LOPRESSOR   predniSONE 10 MG tablet Commonly known as: DELTASONE Replaced by: predniSONE 5 MG tablet   predniSONE 20 MG tablet Commonly known as: DELTASONE       TAKE these medications    albuterol 108 (90 Base) MCG/ACT inhaler Commonly known as: ProAir HFA Inhale 2 puffs every 6 hourss if needed- rescue What changed:  how much to take how to take this when to take this reasons to take this additional instructions   amLODipine 10 MG tablet Commonly known as: NORVASC Take 1 tablet (10 mg total) by mouth daily. Start taking on: February 08, 2021   apixaban 5 MG Tabs tablet Commonly known as: ELIQUIS Take 1  tablet (5 mg  total) by mouth 2 (two) times daily.   atorvastatin 20 MG tablet Commonly known as: LIPITOR Take 20 mg by mouth every morning.   azithromycin 250 MG tablet Commonly known as: ZITHROMAX Take 1 tablet (250 mg total) by mouth daily. Start taking on: February 08, 2021   baclofen 20 MG tablet Commonly known as: LIORESAL Take 20 mg by mouth daily as needed for muscle spasms (pain).   benzonatate 100 MG capsule Commonly known as: TESSALON Take 1 capsule (100 mg total) by mouth every 8 (eight) hours. What changed: when to take this   carvedilol 6.25 MG tablet Commonly known as: COREG Take 1 tablet (6.25 mg total) by mouth 2 (two) times daily with a meal.   clonazePAM 1 MG tablet Commonly known as: KLONOPIN 1 tab at bedtime for sleep as needed What changed:  how much to take how to take this when to take this additional instructions   cloNIDine 0.2 MG tablet Commonly known as: CATAPRES Take 1 tablet (0.2 mg total) by mouth 2 (two) times daily. What changed:  medication strength how much to take when to take this additional instructions   Diclofenac Sodium 3 % Gel Apply 1 application topically at bedtime.   DULoxetine 60 MG capsule Commonly known as: CYMBALTA TAKE 1 CAPSULE(60 MG) BY MOUTH DAILY   Dupixent 300 MG/2ML prefilled syringe Generic drug: dupilumab INJECT 300 MG INTO THE SKIN EVERY 14 (FOURTEEN) DAYS. What changed:  how much to take how to take this when to take this additional instructions   EPINEPHrine 0.3 mg/0.3 mL Soaj injection Commonly known as: EPI-PEN Inject 0.3 mg into the muscle as needed for anaphylaxis. What changed: when to take this   fluticasone 50 MCG/ACT nasal spray Commonly known as: FLONASE Place 1 spray into both nostrils daily. What changed:  when to take this reasons to take this   furosemide 40 MG tablet Commonly known as: LASIX Take 1 tablet (40 mg total) by mouth daily.   ipratropium-albuterol 0.5-2.5 (3) MG/3ML  Soln Commonly known as: DUONEB Take 3 mLs by nebulization every 6 (six) hours as needed (shortness of breath/wheezing).   levothyroxine 50 MCG tablet Commonly known as: SYNTHROID Take 50 mcg by mouth daily.   montelukast 10 MG tablet Commonly known as: SINGULAIR Take 1 tablet (10 mg total) by mouth at bedtime. OFFICE VISIT NEEDED FOR ADDITIONAL REFILLS   naloxone 4 MG/0.1ML Liqd nasal spray kit Commonly known as: NARCAN Place 1 spray into the nose once as needed (opioid overdose).   omeprazole 40 MG capsule Commonly known as: PRILOSEC Take 40 mg by mouth daily.   oxyCODONE-acetaminophen 10-325 MG tablet Commonly known as: PERCOCET Take 1 tablet by mouth 5 (five) times daily.   Ozempic (1 MG/DOSE) 4 MG/3ML Sopn Generic drug: Semaglutide (1 MG/DOSE) Inject 1 mg into the skin every Monday.   phentermine 15 MG capsule Take 15 mg by mouth daily.   predniSONE 5 MG tablet Commonly known as: DELTASONE Label  & dispense according to the schedule below. 10 Pills PO for 3 days then, 8 Pills PO for 3 days, 6 Pills PO for 3 days, 4 Pills PO for 3 days, 2 Pills PO for 3 days, 1 Pills PO for 3 days, 1/2 Pill  PO for 3 days then STOP. Total 95 pills. Replaces: predniSONE 10 MG tablet   pregabalin 300 MG capsule Commonly known as: LYRICA Take 300 mg by mouth 2 (two) times daily.   sertraline 50 MG  tablet Commonly known as: ZOLOFT Take 50 mg by mouth at bedtime.   topiramate 200 MG tablet Commonly known as: TOPAMAX Take 200 mg by mouth at bedtime.   Trelegy Ellipta 100-62.5-25 MCG/INH Aepb Generic drug: Fluticasone-Umeclidin-Vilant Inhale 1 puff into the lungs daily.   Vitamin D (Ergocalciferol) 1.25 MG (50000 UNIT) Caps capsule Commonly known as: DRISDOL TAKE 1 CAPSULE BY MOUTH 1 TIME A WEEK What changed: See the new instructions.   Vyvanse 70 MG capsule Generic drug: lisdexamfetamine Take 70 mg by mouth daily.   zolpidem 10 MG tablet Commonly known as: AMBIEN TAKE 1  TABLET BY MOUTH AT BEDTIME AS NEEDED FOR SLEEP What changed:  how much to take how to take this when to take this additional instructions               Durable Medical Equipment  (From admission, onward)           Start     Ordered   02/07/21 0956  DME Oxygen  Once       Question Answer Comment  Length of Need 6 Months   Mode or (Route) Nasal cannula   Liters per Minute 2   Frequency Only at night (stationary unit needed)   Oxygen conserving device No   Oxygen delivery system Gas      02/07/21 0959             Follow-up Information     [name], MD. Schedule an appointment as soon as possible for a visit in 1 week(s).   Specialty: Family Medicine Contact information: [place] [address] [phone]         Sanda Klein, MD .   Specialty: Cardiology Contact information: [address] [place] [address] [phone]                 Major procedures and Radiology Reports - PLEASE review detailed and final reports thoroughly  -       DG Chest 1 View  Result Date: 02/03/2021 CLINICAL DATA:  Dyspnea, COVID pneumonia EXAM: CHEST  1 VIEW COMPARISON:  01/25/2021 FINDINGS: The heart size and mediastinal contours are within normal limits. Both lungs are clear. The visualized skeletal structures are unremarkable. IMPRESSION: No active disease. Electronically Signed   By: Fidela Salisbury M.D.   On: 02/03/2021 19:36   DG Chest Port 1 View  Result Date: 02/06/2021 CLINICAL DATA:  Shortness of breath. Recent COVID infection. EXAM: PORTABLE CHEST 1 VIEW COMPARISON:  02/05/2021 FINDINGS: The cardiac silhouette, mediastinal and hilar contours are within normal limits. The lungs are clear. No infiltrates, edema or effusions. The bony thorax is intact. IMPRESSION: No acute cardiopulmonary findings. Electronically Signed   By: Marijo Sanes M.D.   On: 02/06/2021 08:37   DG Chest Port 1 View  Result Date:  02/05/2021 CLINICAL DATA:  Shortness of breath.  COVID positive. EXAM: PORTABLE CHEST 1 VIEW COMPARISON:  Chest radiograph, 02/03/2021.  CT chest, 10/08/2017. FINDINGS: Cardiac silhouette is within normal limits. The lungs are hypoinflated. Relatively clear RIGHT chest, with minimal streaky middle lobe opacities. LEFT basilar hazy opacity. No discrete consolidation. No pleural effusion or pneumothorax. No acute osseous abnormality. IMPRESSION: Hypoinflation with minimal basilar atelectasis. Electronically Signed   By: Michaelle Birks M.D.   On: 02/05/2021 07:51   DG Chest Port 1 View  Result Date: 01/25/2021 CLINICAL DATA:  Asthma exacerbation.  Shortness of breath. EXAM: PORTABLE CHEST 1 VIEW COMPARISON:  08/12/2020 FINDINGS:  The cardiac silhouette, mediastinal and hilar contours are within normal limits and stable. Chronic emphysematous and bronchitic type lung changes but no definite infiltrates or effusions. IMPRESSION: Chronic lung changes but no acute pulmonary findings. Electronically Signed   By: Marijo Sanes M.D.   On: 01/25/2021 18:36     Today   Subjective    April [name] today has no headache,no chest abdominal pain,no new weakness tingling or numbness, feels much better wants to go home today.     Objective   Blood pressure (!) 142/85, pulse 72, temperature 98.2 F (36.8 C), temperature source Oral, resp. rate 18, height $RemoveBe'5\' 6"'CLcOapMWy$  (1.676 m), weight (!) 146.8 kg, last menstrual period 01/12/2021, SpO2 93 %.   Intake/Output Summary (Last 24 hours) at 02/07/2021 1000 Last data filed at 02/06/2021 2230 Gross per 24 hour  Intake 200 ml  Output --  Net 200 ml    Exam  Awake Alert, No new F.N deficits, Normal affect [place].AT,PERRAL Supple Neck,No JVD, No cervical lymphadenopathy appriciated.  Symmetrical Chest wall movement, Good air movement bilaterally, CTAB RRR,No Gallops,Rubs or new Murmurs, No Parasternal Heave +ve B.Sounds, Abd Soft, Non tender, No organomegaly appriciated, No  rebound -guarding or rigidity. No Cyanosis, Clubbing or edema, No new Rash or bruise   Data Review   CBC w Diff:  Lab Results  Component Value Date   WBC 18.4 (H) 02/07/2021   HGB 12.0 02/07/2021   HGB 10.4 (L) 01/22/2018   HCT 39.0 02/07/2021   HCT 33.3 (L) 01/22/2018   PLT 400 02/07/2021   PLT 616 (H) 01/22/2018   LYMPHOPCT 11 02/07/2021   BANDSPCT 0 08/06/2017   MONOPCT 7 02/07/2021   EOSPCT 0 02/07/2021   BASOPCT 0 02/07/2021    CMP:  Lab Results  Component Value Date   NA 132 (L) 02/07/2021   NA 142 05/25/2018   K 4.0 02/07/2021   CL 100 02/07/2021   CO2 24 02/07/2021   BUN 27 (H) 02/07/2021   BUN 10 05/25/2018   CREATININE 1.13 (H) 02/07/2021   CREATININE 0.56 05/21/2016   GLU 92 11/03/2017   PROT 6.9 02/07/2021   ALBUMIN 3.1 (L) 02/07/2021   BILITOT 0.7 02/07/2021   ALKPHOS 72 02/07/2021   AST 21 02/07/2021   ALT 27 02/07/2021  .   Total Time in preparing paper work, data evaluation and todays exam - 70 minutes  Lala Lund M.D on 02/07/2021 at 10:00 AM  [facility]

## 2021-02-08 ENCOUNTER — Telehealth: Payer: Self-pay

## 2021-02-08 LAB — CULTURE, BLOOD (ROUTINE X 2)
Culture: NO GROWTH
Special Requests: ADEQUATE

## 2021-02-08 NOTE — Telephone Encounter (Signed)
Transition Care Management Follow-up Telephone Call Date of discharge and from where: 02/07/2021  [facility] How have you been since you were released from the hospital? " Doing good but tires" Any questions or concerns? No  Items Reviewed: Did the pt receive and understand the discharge instructions provided? Yes  Medications obtained and verified? Yes  Other? No  Any new allergies since your discharge? No  Dietary orders reviewed? Yes Do you have support at home? Yes   Home Care and Equipment/Supplies: Were home health services ordered? no If so, what is the name of the agency?  Has the agency set up a time to come to the patient's home? not applicable Were any new equipment or medical supplies ordered?  Yes: oxygen What is the name of the medical supply agency? [employer] Were you able to get the supplies/equipment? Yes Oxygen delivered last night Do you have any questions related to the use of the equipment or supplies? No  Functional Questionnaire: (I = Independent and D = Dependent) ADLs: I  Bathing/Dressing- I  Meal Prep- I  Eating- I  Maintaining continence- I  Transferring/Ambulation- I  Managing Meds- I  Follow up appointments reviewed:  PCP Hospital f/u appt confirmed? Yes  Scheduled to see Pcp   next week  Specialist Hospital f/u appt confirmed? No  Are transportation arrangements needed?  If their condition worsens, is the pt aware to call PCP or go to the Emergency Dept.? Yes Was the patient provided with contact information for the PCP's office or ED? Yes Was to pt encouraged to call back with questions or concerns? Yes  Tomasa Rand, RN, BSN, CEN [facility] [employer] [phone]

## 2021-02-09 LAB — CULTURE, BLOOD (ROUTINE X 2)
Culture: NO GROWTH
Special Requests: ADEQUATE

## 2021-02-14 ENCOUNTER — Telehealth: Payer: Self-pay | Admitting: Internal Medicine

## 2021-02-14 NOTE — Telephone Encounter (Signed)
Patient returned call, she wanted to have her dupixent injection while she is here to see Dr. Annamaria Boots at 3:30 pm.  Advised I would have to get her to the infusion clinic to schedule that for her.  Spoke with a staff member in the clinic and they will call her to schedule her injection prior to her appointment with Dr. Annamaria Boots.  Patient made aware.  She verbalized understanding.  Nothing further needed.

## 2021-02-14 NOTE — Progress Notes (Signed)
Patient ID: April [name], female    DOB: 05-18-1970, 50 y.o.   MRN: [id number]   HPI F former smoker followed for chronic severe asthma/ Xolair complicated by non-compliance,  OSA/ CPAP,  morbid obesity, HBP, depression, GERD, AFib/ Eliquis  NPSG 03/31/07- AHI 5.8/ hr, weight 330 lbs. She canceled planned more recent study. Office spirometry 10/24/14- severe obstructive airways disease. FVC 2.10/66%, FEV1 1.25/47%, FEV1/FVC 0.59, FEF 25-75 percent 0.64/19% Unattended HST 07/06/16- AHI 11/hour, desaturation to 74%. Office Spirometry 07/29/2016-moderately severe obstructive airways disease. FVC 2.21/68%, FEV1 1.53/57%, ratio 0.69, FEF 25-75% 0.98/34% Prolonged hosp [facility] 4/19- tracheostomy. Dupixent enrolled 10/22/2018 HST 12/26/2018- AHI 9.7/ hr, desaturation to 9.7/ hr, desaturation to 69% -------------------------------------------------------   01/25/21- 49 year old female former smoker followed for chronic severe asthma/Xolair, Tracheostomy at [facility] 2019, Insomnia,  complicated by noncompliance, OSA/ oral appliance, morbid obesity, HBP, depression, GERD Dupixent enrolled 10/22/2018-- every 2 weeks Proair hfa, Trelegy  100, Singulair, Neb Duoneb, Dupixent, Ambien 5,  Had failed CPAP- intol- has oral appliance -----Wheezing, sob and yellow mucous.  Nebs and inhalers not helping. Covid vax- 3 Moderna      Home Covid test Neg 2 days ago Body weight today 327 lbs We sent Trelegy samples and Prednisone 2 days ago. She has beeen using neb 3-4x/ day. She feels she needs to be in hosp and I agree For Status asthmaticus  02/15/21- 50 year old female Smoker followed for chronic severe asthma/Xolair, Tracheostomy at [facility] 2019, Insomnia,  complicated by noncompliance, OSA/ oral appliance, morbid obesity, HTN, depression, GERD Covid infection, PAFib, dCHF, Dyslipidemia,  Dupixent enrolled 10/22/2018-- every 2 weeks -Proair hfa, Trelegy  100, Singulair, Neb Duoneb, Dupixent, Ambien 10, Vyvanse,  Hosp f/u  dc'd 10/12- COPD exacerbation, Acute Resp Failure Hypoxia> Zith, pred taper,  Noted desaturation at night, rec updated sleep study On prednisone taper, still feeling activel wheeze. Not using neb. Denies knowing why she has has status asthma over past month. Denies smoking but asks patchess, saying she still 'get the urge". Discussed pharmacy smoking program.  Daughter and grandkids living with her> stress.  CXR 02/06/21 1V FINDINGS: The cardiac silhouette, mediastinal and hilar contours are within normal limits. The lungs are clear. No infiltrates, edema or effusions. The bony thorax is intact. IMPRESSION: No acute cardiopulmonary findings.   Review of Systems-See HPI  + = positive Constitutional:   + weight loss, night sweats, fevers, chills, +fatigue, lassitude. HEENT:   No-  headaches, difficulty swallowing, tooth/dental problems, sore throat,    sneezing, itching, ear ache, +nasal congestion, post nasal drip,  CV:  No-   chest pain, orthopnea, PND, swelling in lower extremities, anasarca, dizziness, palpitations Resp: +shortness of breath with exertion or at rest.              productive cough,  + non-productive cough,  No- coughing up of blood.              No-   change in color of mucus. +wheezing.   Skin: No-   rash or lesions. GI:  No-   heartburn, indigestion, abdominal pain, nausea, vomiting,  GU: MS:  No-   joint pain or swelling.   Neuro-     nothing unusual Psych:  No- change in mood or affect. No depression or anxiety.  No memory loss.    Objective:   Physical Exam    General- Alert, Oriented, Affect-appropriate, Distress- none acute, + morbid  obesity,  Skin- rash-none, lesions- none, excoriation- none Lymphadenopathy- none Head- atraumatic  Eyes- Gross vision intact, PERRLA, conjunctivae clear secretions            Ears- Hearing, canals-normal            Nose- Clear, no-Septal dev, mucus, polyps, erosion, perforation.            Throat- Mallampati III ,  mucosa clear , drainage- none, tonsils- atrophic,                           + Missing  teeth, +upper denture plate is out                  Neck- flexible , trachea+ stoma scar, no stridor , thyroid nl, carotid no bruit Chest - symmetrical excursion , unlabored           Heart/CV- RRR , no murmur , no gallop  , no rub, nl s1 s2                           - JVD- none , edema- none, stasis changes- none, varices- none           Lung- +Diminished, . Wheeze + tight/ diffuse, cough-none,                          dullness-none,  rub- none            Chest wall Abd-  Br/ Gen/ Rectal- Not done, not indicated Extrem- cyanosis- none, clubbing, none, atrophy- none, strength- nl Neuro- grossly intact to observation

## 2021-02-14 NOTE — Telephone Encounter (Signed)
ATC x1.  LVM to return call. 

## 2021-02-15 ENCOUNTER — Ambulatory Visit (INDEPENDENT_AMBULATORY_CARE_PROVIDER_SITE_OTHER): Payer: Medicare Other

## 2021-02-15 ENCOUNTER — Other Ambulatory Visit: Payer: Self-pay

## 2021-02-15 ENCOUNTER — Encounter: Payer: Self-pay | Admitting: Internal Medicine

## 2021-02-15 ENCOUNTER — Ambulatory Visit (INDEPENDENT_AMBULATORY_CARE_PROVIDER_SITE_OTHER): Payer: Medicare Other | Admitting: Internal Medicine

## 2021-02-15 VITALS — BP 120/93 | HR 63 | Temp 98.2°F | Resp 22 | Wt 327.6 lb

## 2021-02-15 DIAGNOSIS — Z720 Tobacco use: Secondary | ICD-10-CM

## 2021-02-15 DIAGNOSIS — J4541 Moderate persistent asthma with (acute) exacerbation: Secondary | ICD-10-CM | POA: Diagnosis not present

## 2021-02-15 DIAGNOSIS — J4550 Severe persistent asthma, uncomplicated: Secondary | ICD-10-CM

## 2021-02-15 MED ORDER — DUPILUMAB 300 MG/2ML ~~LOC~~ SOSY
300.0000 mg | PREFILLED_SYRINGE | Freq: Once | SUBCUTANEOUS | Status: AC
  Administered 2021-02-15: 300 mg via SUBCUTANEOUS
  Filled 2021-02-15: qty 2

## 2021-02-15 MED ORDER — FAMOTIDINE IN NACL 20-0.9 MG/50ML-% IV SOLN: 20.0000 mg | Freq: Once | INTRAVENOUS | Status: DC | PRN

## 2021-02-15 MED ORDER — ALBUTEROL SULFATE HFA 108 (90 BASE) MCG/ACT IN AERS: 2.0000 | INHALATION_SPRAY | Freq: Once | RESPIRATORY_TRACT | Status: DC | PRN

## 2021-02-15 MED ORDER — DIPHENHYDRAMINE HCL 50 MG/ML IJ SOLN: 50.0000 mg | Freq: Once | INTRAMUSCULAR | Status: DC | PRN

## 2021-02-15 MED ORDER — EPINEPHRINE 0.3 MG/0.3ML IJ SOAJ: 0.3000 mg | Freq: Once | INTRAMUSCULAR | Status: DC | PRN

## 2021-02-15 MED ORDER — IPRATROPIUM-ALBUTEROL 0.5-2.5 (3) MG/3ML IN SOLN: 3.0000 mL | Freq: Four times a day (QID) | RESPIRATORY_TRACT | 12 refills | Status: DC | PRN

## 2021-02-15 MED ORDER — METHYLPREDNISOLONE SODIUM SUCC 125 MG IJ SOLR: 125.0000 mg | Freq: Once | INTRAMUSCULAR | Status: DC | PRN

## 2021-02-15 MED ORDER — SODIUM CHLORIDE 0.9 % IV SOLN: Freq: Once | INTRAVENOUS | Status: DC | PRN

## 2021-02-15 MED ORDER — TRELEGY ELLIPTA 200-62.5-25 MCG/ACT IN AEPB: 1.0000 | INHALATION_SPRAY | Freq: Every day | RESPIRATORY_TRACT | 0 refills | Status: DC

## 2021-02-15 NOTE — Progress Notes (Signed)
Diagnosis: Asthma  Provider:  Marshell Garfinkel, MD  Procedure: Injection  Dupixent (Dupilumab), Dose: 300 mg, Site: subcutaneous  Discharge: Condition: Good, Destination: Home . AVS provided to patient.   Performed by:  Satin Boal, Sherlon Handing, LPN

## 2021-02-15 NOTE — Assessment & Plan Note (Signed)
Fighting relapse- not sure how successfully Plan- Smoking cessation program to prevent relapse if possible.

## 2021-02-15 NOTE — Patient Instructions (Addendum)
Order- samples x 3 Trelegy 200   inhale 1 puff then rinse mouth,once daily  Use this for now instead of Trelegy 100  Order- [facility]- change from [place] to [place] name to Pharmacy smoking cessation program  Finish prednisone taper  Use your nebulizer machine 2-3 times per day, every day         I've sent refill to pharmacy

## 2021-02-15 NOTE — Assessment & Plan Note (Signed)
Not well controlled despite recent hosp and current prednisone taper. Meds discussed Plan- samples Trelegy 200 to replace Trelegy 100. Finish prednisone taper. Use nebulizer at least twice daily. Change Dupixent to [employer]

## 2021-02-19 ENCOUNTER — Ambulatory Visit: Payer: Medicare Other

## 2021-02-19 ENCOUNTER — Other Ambulatory Visit (HOSPITAL_COMMUNITY): Payer: Self-pay

## 2021-02-20 ENCOUNTER — Ambulatory Visit: Payer: Medicare Other | Attending: Family Medicine | Admitting: Family Medicine

## 2021-02-20 ENCOUNTER — Other Ambulatory Visit: Payer: Self-pay

## 2021-02-20 ENCOUNTER — Encounter: Payer: Self-pay | Admitting: Family Medicine

## 2021-02-20 VITALS — BP 110/68 | HR 61 | Ht 66.0 in | Wt 325.8 lb

## 2021-02-20 DIAGNOSIS — Z23 Encounter for immunization: Secondary | ICD-10-CM | POA: Diagnosis not present

## 2021-02-20 DIAGNOSIS — J4541 Moderate persistent asthma with (acute) exacerbation: Secondary | ICD-10-CM

## 2021-02-20 DIAGNOSIS — E119 Type 2 diabetes mellitus without complications: Secondary | ICD-10-CM | POA: Diagnosis not present

## 2021-02-20 DIAGNOSIS — E1159 Type 2 diabetes mellitus with other circulatory complications: Secondary | ICD-10-CM | POA: Diagnosis not present

## 2021-02-20 DIAGNOSIS — Z1211 Encounter for screening for malignant neoplasm of colon: Secondary | ICD-10-CM

## 2021-02-20 DIAGNOSIS — I480 Paroxysmal atrial fibrillation: Secondary | ICD-10-CM

## 2021-02-20 DIAGNOSIS — M791 Myalgia, unspecified site: Secondary | ICD-10-CM

## 2021-02-20 DIAGNOSIS — G629 Polyneuropathy, unspecified: Secondary | ICD-10-CM

## 2021-02-20 DIAGNOSIS — I152 Hypertension secondary to endocrine disorders: Secondary | ICD-10-CM

## 2021-02-20 MED ORDER — DULOXETINE HCL 60 MG PO CPEP: 60.0000 mg | ORAL_CAPSULE | Freq: Every day | ORAL | 1 refills | Status: DC

## 2021-02-20 MED ORDER — CARVEDILOL 6.25 MG PO TABS: 6.2500 mg | ORAL_TABLET | Freq: Two times a day (BID) | ORAL | 1 refills | Status: DC

## 2021-02-20 MED ORDER — APIXABAN 5 MG PO TABS: 5.0000 mg | ORAL_TABLET | Freq: Two times a day (BID) | ORAL | 1 refills | Status: DC

## 2021-02-20 MED ORDER — AMLODIPINE BESYLATE 10 MG PO TABS: 10.0000 mg | ORAL_TABLET | Freq: Every day | ORAL | 1 refills | Status: DC

## 2021-02-20 NOTE — Progress Notes (Signed)
Medication refills
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
## 2021-02-20 NOTE — Patient Instructions (Signed)
Muscle Pain, Adult Muscle pain, also called myalgia, is a condition in which a person has pain in one or more muscles in the body. Muscle pain may be mild, moderate, or severe. It may feel sharp, achy, or burning. In most cases, the pain lasts only a short time and goes away without treatment. Muscle pain can result from using muscles in a new or different way or after a period of inactivity. It is normal to feel some muscle pain after starting an exercise program. Muscles that have not been used often will be sore at first. What are the causes? This condition is caused by using muscles in a new or different way after a period of inactivity. Other causes may include: Overuse or muscle strain, especially if you are not in shape. This is the most common cause of muscle pain. Injury or bruising. Infectious diseases, including diseases caused by viruses, such as the flu (influenza). Fibromyalgia.This is a long-term, or chronic, condition that causes muscle tenderness, tiredness (fatigue), and headache. Autoimmune or rheumatologic diseases. These are conditions, such as lupus, in which the body's defense system (immunesystem) attacks areas in the body. Certain medicines, including ACE inhibitors and statins. What are the signs or symptoms? The main symptom of this condition is sore or painful muscles, including during activity and when stretching. You may also have slight swelling. How is this diagnosed? This condition is diagnosed with a physical exam. Your health care provider will ask questions about your pain and when it began. If you have not had muscle pain for very long, your health care provider may want to wait before doing much testing. If your muscle pain has lasted a long time, tests may be done right away. In some cases, this may include tests to rule out certain conditions or illnesses. How is this treated? Treatment for this condition depends on the cause. Home care is often enough to  relieve muscle pain. Your health care provider may also prescribe NSAIDs, such as ibuprofen. Follow these instructions at home: Medicines Take over-the-counter and prescription medicines only as told by your health care provider. Ask your health care provider if the medicine prescribed to you requires you to avoid driving or using machinery. Managing pain, swelling, and discomfort   If directed, put ice on the painful area. To do this: Put ice in a plastic bag. Place a towel between your skin and the bag. Leave the ice on for 20 minutes, 2-3 times a day. For the first 2 days of muscle soreness, or if there is swelling: Do not soak in hot baths. Do not use a hot tub, steam room, sauna, heating pad, or other heat source. After 48-72 hours, you may alternate between applying ice and applying heat as told by your health care provider. If directed, apply heat to the affected area as often as told by your health care provider. Use the heat source that your health care provider recommends, such as a moist heat pack or a heating pad. Place a towel between your skin and the heat source. Leave the heat on for 20-30 minutes. Remove the heat if your skin turns bright red. This is especially important if you are unable to feel pain, heat, or cold. You may have a greater risk of getting burned. If you have an injury, raise (elevate) the injured area above the level of your heart while you are sitting or lying down. Activity  If overuse is causing your muscle pain: Slow down your activities  until the pain goes away. Do regular, gentle exercises if you are not usually active. Warm up before exercising. Stretch before and after exercising. This can help lower the risk of muscle pain. Do not continue working out if the pain is severe. Severe pain could mean that you have injured a muscle. Do not lift anything that is heavier than 5-10 lb (2.3-4.5 kg), or the limit that you are told, until your health care  provider says that it is safe. Return to your normal activities as told by your health care provider. Ask your health care provider what activities are safe for you. General instructions Do not use any products that contain nicotine or tobacco, such as cigarettes, e-cigarettes, and chewing tobacco. These can delay healing. If you need help quitting, ask your health care provider. Keep all follow-up visits as told by your health care provider. This is important. Contact a health care provider if you have: Muscle pain that gets worse and medicines do not help. Muscle pain that lasts longer than 3 days. A rash or fever along with muscle pain. Muscle pain after a tick bite. Muscle pain while working out, even though you are in good physical condition. Redness, soreness, or swelling along with muscle pain. Muscle pain after starting a new medicine or changing the dose of a medicine. Get help right away if you have: Trouble breathing. Trouble swallowing. Muscle pain along with a stiff neck, fever, and vomiting. Severe muscle weakness or you cannot move part of your body. These symptoms may represent a serious problem that is an emergency. Do not wait to see if the symptoms will go away. Get medical help right away. Call your local emergency services (911 in the [place] Do not drive yourself to the hospital. Summary Muscle pain usually lasts only a short time and goes away without treatment. This condition is caused by using muscles in a new or different way after a period of inactivity. If your muscle pain lasts longer than 3 days, tell your health care provider. This information is not intended to replace advice given to you by your health care provider. Make sure you discuss any questions you have with your health care provider. Document Revised: 01/22/2019 Document Reviewed: 01/22/2019 Elsevier Patient Education  2022 [employer].

## 2021-02-22 ENCOUNTER — Other Ambulatory Visit (HOSPITAL_COMMUNITY): Payer: Self-pay

## 2021-02-27 ENCOUNTER — Other Ambulatory Visit: Payer: Self-pay | Admitting: Family Medicine

## 2021-02-27 DIAGNOSIS — J4541 Moderate persistent asthma with (acute) exacerbation: Secondary | ICD-10-CM

## 2021-02-27 NOTE — Telephone Encounter (Signed)
Requested Prescriptions  Pending Prescriptions Disp Refills  . montelukast (SINGULAIR) 10 MG tablet [Pharmacy Med Name: MONTELUKAST 10MG  TABLETS] 90 tablet 0    Sig: TAKE 1 TABLET BY MOUTH AT BEDTIME. OFFICE VISIT NEEDED FOR ADDITIONAL REFILLS     Pulmonology:  Leukotriene Inhibitors Passed - 02/27/2021  6:21 AM      Passed - Valid encounter within last 12 months    Recent Outpatient Visits          1 week ago Moderate persistent asthma with acute exacerbation   [place] [name], MD   11 months ago Encounter for [employer] annual wellness exam   [place], Charlane Ferretti, MD   1 year ago Hot flashes   [place] [name], MD   1 year ago Moderate persistent asthma with exacerbation   [place], [place], MD   1 year ago Type 2 diabetes mellitus without complication, without long-term current use of insulin ([place])   [place], MD      Future Appointments            In 2 months [name], MD [place]

## 2021-03-01 ENCOUNTER — Ambulatory Visit: Payer: Medicare Other

## 2021-03-02 ENCOUNTER — Ambulatory Visit: Payer: Medicare Other

## 2021-03-02 ENCOUNTER — Telehealth: Payer: Medicare Other | Admitting: Pharmacist

## 2021-03-02 ENCOUNTER — Telehealth: Payer: Self-pay | Admitting: Pharmacist

## 2021-03-02 NOTE — Telephone Encounter (Signed)
Was reviewing clinic's biologic patients and found that the plan was to change patient to Tezspire at last OV w Dr. Annamaria Boots on 02/15/21.  No referral or communication was routed to pharmacy team.  Infusion center orders for Tezspire placed today.  Dose: 210mg  SQ every 4 weeks First dose will require 1 hour of monitoring. Subsequent doses do not require monitoring.  Have not yet discontinued Dupixent therapy plan since she will continue Dupixent until [place] Every is started. Once approved and if affordable for patient, first dose of Tezspire should be scheduled to start 2 weeks after last Dupixent dose.  Will also place referral to pharmacy smoking cessation team since referral was not placed for this either.  [name], PharmD, MPH, BCPS Clinical Pharmacist (Rheumatology and Pulmonology)

## 2021-03-02 NOTE — Telephone Encounter (Signed)
Was reviewing clinic's biologic patients and found that Dr. Annamaria Boots wanted referral to smoking cessation pool placed on 02/15/21.  Routing to smoking cessation pool for f/u  [name], PharmD, MPH, BCPS Clinical Pharmacist (Rheumatology and Pulmonology)

## 2021-03-02 NOTE — Telephone Encounter (Signed)
Removed Tezspire therapy plan for infusion center per request of Kim C to prevent confusion with nursing team at infusion center. Maudie Mercury will start separate Tezspire BIV encounter and once approved I'll place therapy plan again.  Patient will continue receiving Dupixent until Tezspire coverage is in place  [name], PharmD, MPH, BCPS Clinical Pharmacist (Rheumatology and Pulmonology)

## 2021-03-05 ENCOUNTER — Other Ambulatory Visit (HOSPITAL_COMMUNITY): Payer: Self-pay

## 2021-03-05 ENCOUNTER — Ambulatory Visit: Payer: Medicare Other

## 2021-03-07 ENCOUNTER — Ambulatory Visit (INDEPENDENT_AMBULATORY_CARE_PROVIDER_SITE_OTHER): Payer: Medicare Other

## 2021-03-07 ENCOUNTER — Other Ambulatory Visit: Payer: Self-pay

## 2021-03-07 VITALS — BP 120/81 | HR 94 | Temp 97.8°F | Resp 18 | Ht 66.0 in | Wt 319.8 lb

## 2021-03-07 DIAGNOSIS — J4550 Severe persistent asthma, uncomplicated: Secondary | ICD-10-CM | POA: Diagnosis not present

## 2021-03-07 MED ORDER — DIPHENHYDRAMINE HCL 50 MG/ML IJ SOLN: 50.0000 mg | Freq: Once | INTRAMUSCULAR | Status: DC | PRN

## 2021-03-07 MED ORDER — METHYLPREDNISOLONE SODIUM SUCC 125 MG IJ SOLR: 125.0000 mg | Freq: Once | INTRAMUSCULAR | Status: DC | PRN

## 2021-03-07 MED ORDER — SODIUM CHLORIDE 0.9 % IV SOLN: Freq: Once | INTRAVENOUS | Status: DC | PRN

## 2021-03-07 MED ORDER — DUPILUMAB 300 MG/2ML ~~LOC~~ SOSY
300.0000 mg | PREFILLED_SYRINGE | Freq: Once | SUBCUTANEOUS | Status: AC
  Administered 2021-03-07: 300 mg via SUBCUTANEOUS
  Filled 2021-03-07: qty 2

## 2021-03-07 MED ORDER — EPINEPHRINE 0.3 MG/0.3ML IJ SOAJ: 0.3000 mg | Freq: Once | INTRAMUSCULAR | Status: DC | PRN

## 2021-03-07 MED ORDER — FAMOTIDINE IN NACL 20-0.9 MG/50ML-% IV SOLN: 20.0000 mg | Freq: Once | INTRAVENOUS | Status: DC | PRN

## 2021-03-07 MED ORDER — ALBUTEROL SULFATE HFA 108 (90 BASE) MCG/ACT IN AERS: 2.0000 | INHALATION_SPRAY | Freq: Once | RESPIRATORY_TRACT | Status: DC | PRN

## 2021-03-07 NOTE — Progress Notes (Signed)
Diagnosis: severe persistent asthma  Provider:  Marshell Garfinkel, MD  Procedure: Injection  Dupixent (Dupilumab), Dose: 300 mg, Site: subcutaneous  Discharge: Condition: Good, Destination: Home . AVS provided to patient.   Performed by:  Charlie Pitter, RN

## 2021-03-14 ENCOUNTER — Other Ambulatory Visit (HOSPITAL_COMMUNITY): Payer: Self-pay

## 2021-03-15 ENCOUNTER — Encounter: Payer: Self-pay | Admitting: Internal Medicine

## 2021-03-15 NOTE — Telephone Encounter (Addendum)
ERROR

## 2021-03-19 ENCOUNTER — Ambulatory Visit: Payer: Medicare Other

## 2021-03-20 ENCOUNTER — Ambulatory Visit: Payer: Medicare Other

## 2021-03-20 ENCOUNTER — Telehealth: Payer: Self-pay

## 2021-03-20 NOTE — Telephone Encounter (Signed)
Called pt at 3.59 pm for 4 pm appointment. Voicemail full.as of 4.30 pt has not arrived

## 2021-03-27 ENCOUNTER — Encounter: Payer: Self-pay | Admitting: Internal Medicine

## 2021-03-27 ENCOUNTER — Telehealth: Payer: Self-pay | Admitting: Pharmacy Technician

## 2021-03-27 NOTE — Telephone Encounter (Addendum)
Auth Submission: approved Payer: [employer] MEDICARE Medication & CPT/J Code(s) submitted: TEZSPIRE Route of submission (phone, fax, portal): PORTAL Auth type: Buy/Bill Units/visits requested: 210 MG Q28D Reference number: [id number] Approval from: 03/05/22 to 09/03/21

## 2021-03-27 NOTE — Telephone Encounter (Signed)
Therapy plan placed for Tezspire for [place]  Dose: 210 mg SQ every 4 weeks First dose will require 1 hour of monitoring. Subsequent doses do not require monitoring.  Dupixent therapy plan discontinued today.  Will use sample for first Tezspire dose per Tresa Res, Rph, and Kim C, CPhT.  First dose of Tezspire should be scheduled to start 2 weeks after last Dupixent dose.    [name], PharmD, MPH, BCPS Clinical Pharmacist (Rheumatology and Pulmonology)

## 2021-03-27 NOTE — Telephone Encounter (Signed)
Auth Submission: APPROVED Payer: [employer] Medication & CPT/J Code(s) submitted: Tezspire [place]) [phone] Route of submission (phone, fax, portal): PORTAL Auth type: Buy/Bill Units/visits requested: 210 MG Q4WKS Reference number: [id number] Approval from: 03/05/21 to 09/03/21 at [facility] as Buy/Bill

## 2021-03-27 NOTE — Telephone Encounter (Signed)
ERROR

## 2021-03-27 NOTE — Addendum Note (Signed)
Addended by: Cassandria Anger on: 03/27/2021 03:00 PM   Modules accepted: Orders

## 2021-03-30 NOTE — Telephone Encounter (Signed)
Patient does not meet inclusion criteria for smoking cessation telephone based service as patient has history of depression.

## 2021-04-02 ENCOUNTER — Ambulatory Visit: Payer: Medicare Other

## 2021-04-03 IMAGING — DX DG CHEST 1V PORT
1 series · 1 of 1 positions shown · non-contrast
Comparison: January 2019

CLINICAL DATA: Cough

EXAM:
PORTABLE CHEST 1 VIEW

[chest ap]
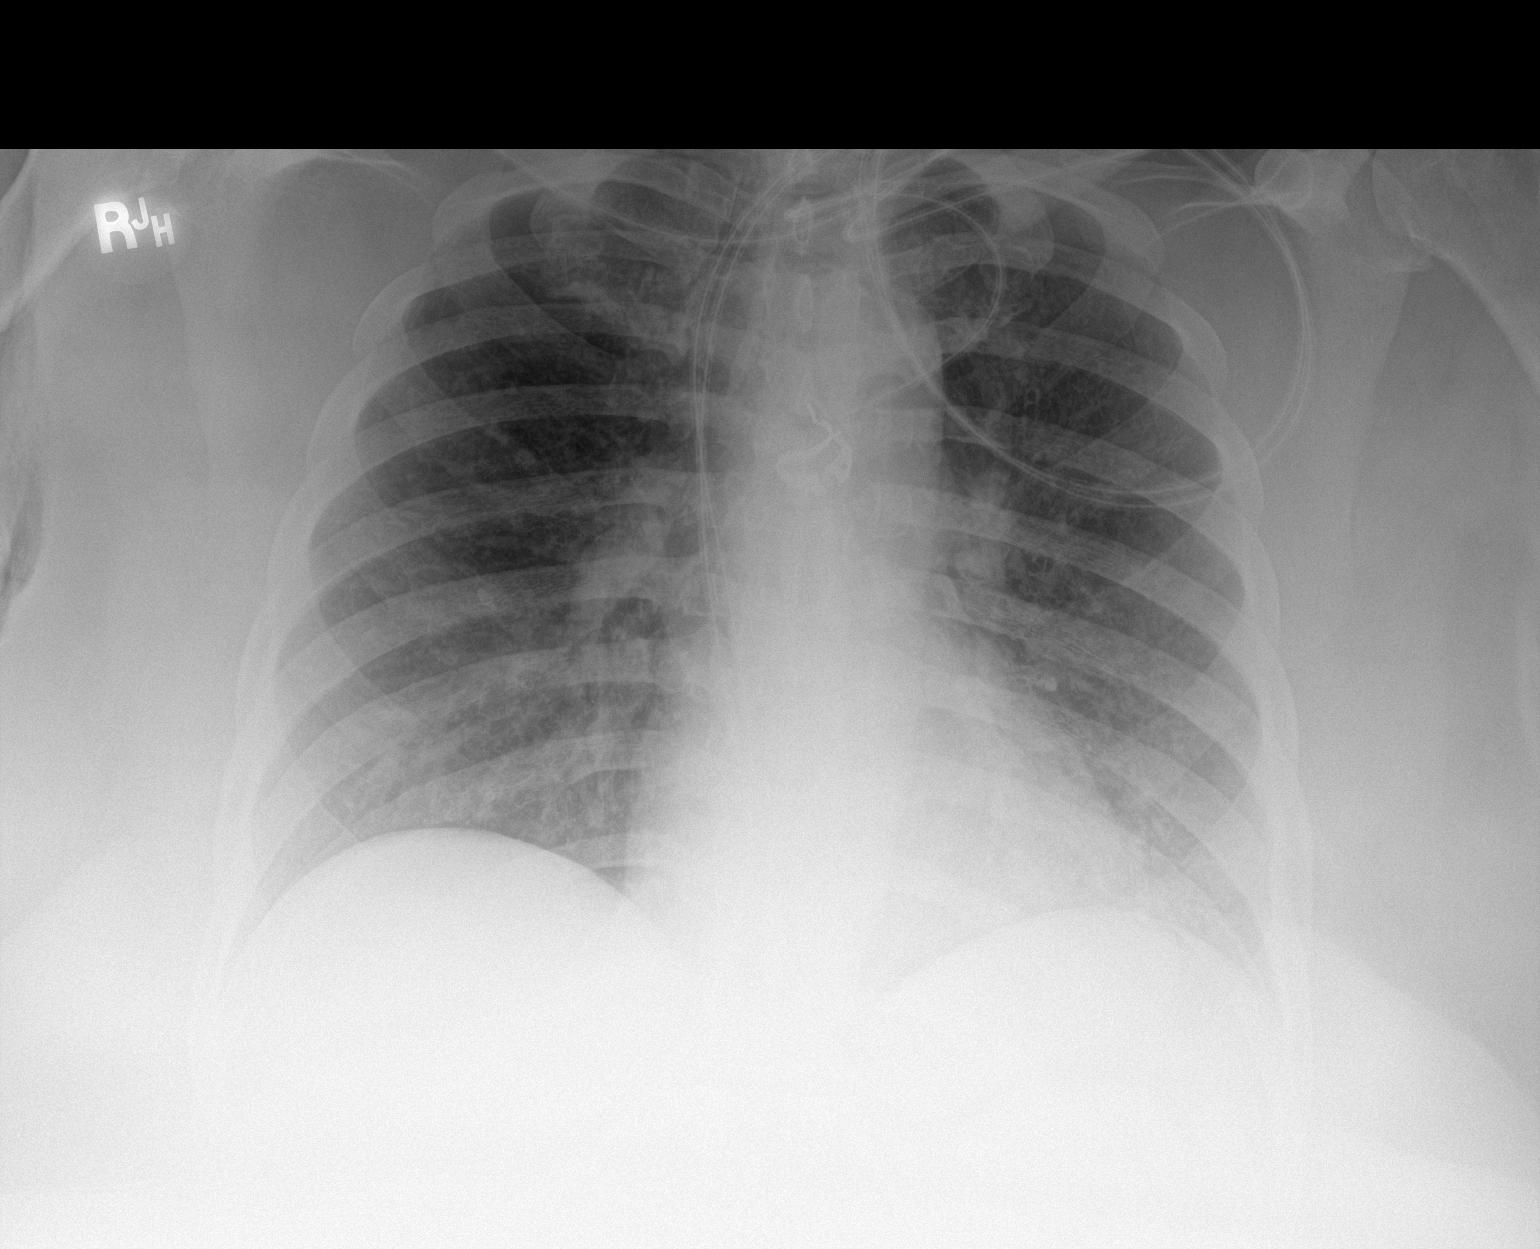

[1 of 1 positions shown; findings below may reference images not displayed]

FINDINGS: The heart size and mediastinal contours are within normal limits.
Both lungs are clear. No pleural effusion or pneumothorax. The
visualized skeletal structures are unremarkable.
IMPRESSION: No acute process in the chest.

## 2021-04-04 ENCOUNTER — Other Ambulatory Visit: Payer: Self-pay | Admitting: Internal Medicine

## 2021-04-09 ENCOUNTER — Other Ambulatory Visit (HOSPITAL_COMMUNITY): Payer: Self-pay

## 2021-04-12 ENCOUNTER — Ambulatory Visit (INDEPENDENT_AMBULATORY_CARE_PROVIDER_SITE_OTHER): Payer: Medicare Other

## 2021-04-12 VITALS — BP 184/121 | HR 68 | Temp 98.5°F | Resp 18 | Ht 66.0 in | Wt 324.8 lb

## 2021-04-12 DIAGNOSIS — J4550 Severe persistent asthma, uncomplicated: Secondary | ICD-10-CM | POA: Diagnosis not present

## 2021-04-12 MED ORDER — ALBUTEROL SULFATE HFA 108 (90 BASE) MCG/ACT IN AERS: 2.0000 | INHALATION_SPRAY | Freq: Once | RESPIRATORY_TRACT | Status: DC | PRN

## 2021-04-12 MED ORDER — DIPHENHYDRAMINE HCL 50 MG/ML IJ SOLN: 50.0000 mg | Freq: Once | INTRAMUSCULAR | Status: DC | PRN

## 2021-04-12 MED ORDER — EPINEPHRINE 0.3 MG/0.3ML IJ SOAJ: 0.3000 mg | Freq: Once | INTRAMUSCULAR | Status: DC | PRN

## 2021-04-12 MED ORDER — METHYLPREDNISOLONE SODIUM SUCC 125 MG IJ SOLR: 125.0000 mg | Freq: Once | INTRAMUSCULAR | Status: DC | PRN

## 2021-04-12 MED ORDER — SODIUM CHLORIDE 0.9 % IV SOLN: Freq: Once | INTRAVENOUS | Status: DC | PRN

## 2021-04-12 MED ORDER — TEZEPELUMAB-EKKO 210 MG/1.91ML ~~LOC~~ SOSY
210.0000 mg | PREFILLED_SYRINGE | Freq: Once | SUBCUTANEOUS | Status: AC
  Administered 2021-04-12: 210 mg via SUBCUTANEOUS

## 2021-04-12 MED ORDER — FAMOTIDINE IN NACL 20-0.9 MG/50ML-% IV SOLN: 20.0000 mg | Freq: Once | INTRAVENOUS | Status: DC | PRN

## 2021-04-12 NOTE — Progress Notes (Signed)
Diagnosis: Asthma  Provider:  Marshell Garfinkel, MD  Procedure: Injection  Tezepelumab (Tezspire), Dose: 210mg  , Site: subcutaneous, Number of injections: 1 IV site discontinued, observation time completed.  Discharge: Condition: Good, Destination: Home . AVS provided to patient.   Performed by:  Paul Dykes, RN

## 2021-04-13 NOTE — Addendum Note (Signed)
Addended by: Cassandria Anger on: 04/13/2021 08:32 AM   Modules accepted: Orders

## 2021-04-13 NOTE — Telephone Encounter (Signed)
Patient received first dose of Tezspire on 04/12/21 at [facility]. Removed Dupixent from medication list today and added Tezspire to med list.  F?u appointment with Dr. Annamaria Boots is scheduled for 05/21/21.  [name], PharmD, MPH, BCPS Clinical Pharmacist (Rheumatology and Pulmonology)

## 2021-04-16 ENCOUNTER — Ambulatory Visit: Payer: Medicare Other

## 2021-05-03 ENCOUNTER — Encounter: Payer: Self-pay | Admitting: Internal Medicine

## 2021-05-08 ENCOUNTER — Telehealth: Payer: Self-pay

## 2021-05-08 NOTE — Telephone Encounter (Signed)
Contacted pt to schedule Medicare Wellness pt didn't answer lvm   °

## 2021-05-10 ENCOUNTER — Ambulatory Visit (INDEPENDENT_AMBULATORY_CARE_PROVIDER_SITE_OTHER): Payer: Medicare Other | Admitting: *Deleted

## 2021-05-10 VITALS — BP 173/81 | HR 72 | Temp 98.8°F | Resp 18 | Ht 66.0 in | Wt 330.6 lb

## 2021-05-10 DIAGNOSIS — J4550 Severe persistent asthma, uncomplicated: Secondary | ICD-10-CM | POA: Diagnosis not present

## 2021-05-10 MED ORDER — METHYLPREDNISOLONE SODIUM SUCC 125 MG IJ SOLR: 125.0000 mg | Freq: Once | INTRAMUSCULAR | Status: DC | PRN

## 2021-05-10 MED ORDER — DIPHENHYDRAMINE HCL 50 MG/ML IJ SOLN: 50.0000 mg | Freq: Once | INTRAMUSCULAR | Status: DC | PRN

## 2021-05-10 MED ORDER — TEZEPELUMAB-EKKO 210 MG/1.91ML ~~LOC~~ SOSY
210.0000 mg | PREFILLED_SYRINGE | Freq: Once | SUBCUTANEOUS | Status: AC
  Administered 2021-05-10: 210 mg via SUBCUTANEOUS

## 2021-05-10 MED ORDER — ALBUTEROL SULFATE HFA 108 (90 BASE) MCG/ACT IN AERS: 2.0000 | INHALATION_SPRAY | Freq: Once | RESPIRATORY_TRACT | Status: DC | PRN

## 2021-05-10 MED ORDER — FAMOTIDINE IN NACL 20-0.9 MG/50ML-% IV SOLN: 20.0000 mg | Freq: Once | INTRAVENOUS | Status: DC | PRN

## 2021-05-10 MED ORDER — SODIUM CHLORIDE 0.9 % IV SOLN: Freq: Once | INTRAVENOUS | Status: DC | PRN

## 2021-05-10 MED ORDER — EPINEPHRINE 0.3 MG/0.3ML IJ SOAJ: 0.3000 mg | Freq: Once | INTRAMUSCULAR | Status: DC | PRN

## 2021-05-10 NOTE — Progress Notes (Signed)
Diagnosis: Asthma  Provider:  Marshell Garfinkel, MD  Procedure: Injection  Tezepelumab (Tezspire), Dose: 210 mg , Site: subcutaneous, Number of injections: 1  Discharge: Condition: Good, Destination: Home . AVS provided to patient.   Performed by:  Oren Beckmann, RN

## 2021-05-18 NOTE — Progress Notes (Deleted)
Patient ID: April [name], female    DOB: 25-Jan-1971, 51 y.o.   MRN: [id number]   HPI F former smoker followed for chronic severe asthma/ Xolair complicated by non-compliance,  OSA/ CPAP,  morbid obesity, HBP, depression, GERD, AFib/ Eliquis  NPSG 03/31/07- AHI 5.8/ hr, weight 330 lbs. She canceled planned more recent study. Office spirometry 10/24/14- severe obstructive airways disease. FVC 2.10/66%, FEV1 1.25/47%, FEV1/FVC 0.59, FEF 25-75 percent 0.64/19% Unattended HST 07/06/16- AHI 11/hour, desaturation to 74%. Office Spirometry 07/29/2016-moderately severe obstructive airways disease. FVC 2.21/68%, FEV1 1.53/57%, ratio 0.69, FEF 25-75% 0.98/34% Prolonged hosp [facility] 4/19- tracheostomy. Dupixent enrolled 10/22/2018 HST 12/26/2018- AHI 9.7/ hr, desaturation to 9.7/ hr, desaturation to 69% -------------------------------------------------------   02/15/21- 51 year old female Smoker followed for chronic severe asthma/Xolair, Tracheostomy at [facility] 2019, Insomnia,  complicated by noncompliance, OSA/ oral appliance, morbid obesity, HTN, depression, GERD Covid infection, PAFib, dCHF, Dyslipidemia,  Dupixent enrolled 10/22/2018-- every 2 weeks -Proair hfa, Trelegy  100, Singulair, Neb Duoneb, Dupixent, Ambien 10, Vyvanse,  Hosp f/u dc'd 10/12- COPD exacerbation, Acute Resp Failure Hypoxia> Zith, pred taper,  Noted desaturation at night, rec updated sleep study On prednisone taper, still feeling activel wheeze. Not using neb. Denies knowing why she has has status asthma over past month. Denies smoking but asks patchess, saying she still 'get the urge". Discussed pharmacy smoking program.  Daughter and grandkids living with her> stress.  CXR 02/06/21 1V FINDINGS: The cardiac silhouette, mediastinal and hilar contours are within normal limits. The lungs are clear. No infiltrates, edema or effusions. The bony thorax is intact. IMPRESSION: No acute cardiopulmonary findings.  05/21/21- 51 year old  female Smoker followed for chronic severe asthma/Xolair, Tracheostomy at [facility] 2019, Insomnia,  complicated by noncompliance, OSA/ oral appliance, morbid obesity, HTN, depression, GERD Covid infection, PAFib, dCHF, Dyslipidemia,  Dupixent enrolled 10/22/2018-- every 2 weeks -Proair hfa, Trelegy  200, Singulair, Neb Duoneb, Tezspire, Ambien 10, Vyvanse,  Not candidate for pharmacy smoking cessation program due to hx depression. Body weight today- Covid vax- Flu vax-    Review of Systems-See HPI  + = positive Constitutional:   + weight loss, night sweats, fevers, chills, +fatigue, lassitude. HEENT:   No-  headaches, difficulty swallowing, tooth/dental problems, sore throat,    sneezing, itching, ear ache, +nasal congestion, post nasal drip,  CV:  No-   chest pain, orthopnea, PND, swelling in lower extremities, anasarca, dizziness, palpitations Resp: +shortness of breath with exertion or at rest.              productive cough,  + non-productive cough,  No- coughing up of blood.              No-   change in color of mucus. +wheezing.   Skin: No-   rash or lesions. GI:  No-   heartburn, indigestion, abdominal pain, nausea, vomiting,  GU: MS:  No-   joint pain or swelling.   Neuro-     nothing unusual Psych:  No- change in mood or affect. No depression or anxiety.  No memory loss.    Objective:   Physical Exam    General- Alert, Oriented, Affect-appropriate, Distress- none acute, + morbid  obesity,  Skin- rash-none, lesions- none, excoriation- none Lymphadenopathy- none Head- atraumatic            Eyes- Gross vision intact, PERRLA, conjunctivae clear secretions            Ears- Hearing, canals-normal  Nose- Clear, no-Septal dev, mucus, polyps, erosion, perforation.            Throat- Mallampati III , mucosa clear , drainage- none, tonsils- atrophic,                           + Missing  teeth, +upper denture plate is out                  Neck- flexible , trachea+ stoma scar, no  stridor , thyroid nl, carotid no bruit Chest - symmetrical excursion , unlabored           Heart/CV- RRR , no murmur , no gallop  , no rub, nl s1 s2                           - JVD- none , edema- none, stasis changes- none, varices- none           Lung- +Diminished, . Wheeze + tight/ diffuse, cough-none, dullness-none,  rub- none            Chest wall Abd-  Br/ Gen/ Rectal- Not done, not indicated Extrem- cyanosis- none, clubbing, none, atrophy- none, strength- nl Neuro- grossly intact to observation

## 2021-05-21 ENCOUNTER — Encounter: Payer: Self-pay | Admitting: Internal Medicine

## 2021-05-21 ENCOUNTER — Ambulatory Visit: Payer: Medicare Other | Admitting: Internal Medicine

## 2021-05-22 ENCOUNTER — Emergency Department (HOSPITAL_COMMUNITY): Payer: Medicare Other

## 2021-05-22 ENCOUNTER — Encounter (HOSPITAL_COMMUNITY): Payer: Self-pay | Admitting: Emergency Medicine

## 2021-05-22 ENCOUNTER — Encounter: Payer: Self-pay | Admitting: Internal Medicine

## 2021-05-22 ENCOUNTER — Inpatient Hospital Stay (HOSPITAL_COMMUNITY)
Admission: EM | Admit: 2021-05-22 | Discharge: 2021-05-26 | DRG: 190 | Disposition: A | Discharge status: Home / Self Care | Payer: Medicare Other | Attending: Internal Medicine | Admitting: Internal Medicine

## 2021-05-22 DIAGNOSIS — Z79891 Long term (current) use of opiate analgesic: Secondary | ICD-10-CM

## 2021-05-22 DIAGNOSIS — J439 Emphysema, unspecified: Secondary | ICD-10-CM | POA: Diagnosis not present

## 2021-05-22 DIAGNOSIS — L83 Acanthosis nigricans: Secondary | ICD-10-CM | POA: Diagnosis present

## 2021-05-22 DIAGNOSIS — J4541 Moderate persistent asthma with (acute) exacerbation: Secondary | ICD-10-CM | POA: Diagnosis present

## 2021-05-22 DIAGNOSIS — Z8701 Personal history of pneumonia (recurrent): Secondary | ICD-10-CM

## 2021-05-22 DIAGNOSIS — F411 Generalized anxiety disorder: Secondary | ICD-10-CM | POA: Diagnosis present

## 2021-05-22 DIAGNOSIS — F419 Anxiety disorder, unspecified: Secondary | ICD-10-CM | POA: Diagnosis present

## 2021-05-22 DIAGNOSIS — Z7985 Long-term (current) use of injectable non-insulin antidiabetic drugs: Secondary | ICD-10-CM

## 2021-05-22 DIAGNOSIS — K219 Gastro-esophageal reflux disease without esophagitis: Secondary | ICD-10-CM | POA: Diagnosis present

## 2021-05-22 DIAGNOSIS — Z20822 Contact with and (suspected) exposure to covid-19: Secondary | ICD-10-CM | POA: Diagnosis present

## 2021-05-22 DIAGNOSIS — I480 Paroxysmal atrial fibrillation: Secondary | ICD-10-CM | POA: Diagnosis present

## 2021-05-22 DIAGNOSIS — G4700 Insomnia, unspecified: Secondary | ICD-10-CM | POA: Diagnosis present

## 2021-05-22 DIAGNOSIS — Z8249 Family history of ischemic heart disease and other diseases of the circulatory system: Secondary | ICD-10-CM

## 2021-05-22 DIAGNOSIS — E870 Hyperosmolality and hypernatremia: Secondary | ICD-10-CM | POA: Diagnosis present

## 2021-05-22 DIAGNOSIS — I110 Hypertensive heart disease with heart failure: Secondary | ICD-10-CM | POA: Diagnosis present

## 2021-05-22 DIAGNOSIS — J45901 Unspecified asthma with (acute) exacerbation: Secondary | ICD-10-CM | POA: Diagnosis present

## 2021-05-22 DIAGNOSIS — Z825 Family history of asthma and other chronic lower respiratory diseases: Secondary | ICD-10-CM

## 2021-05-22 DIAGNOSIS — Z7951 Long term (current) use of inhaled steroids: Secondary | ICD-10-CM

## 2021-05-22 DIAGNOSIS — E559 Vitamin D deficiency, unspecified: Secondary | ICD-10-CM | POA: Diagnosis present

## 2021-05-22 DIAGNOSIS — J9601 Acute respiratory failure with hypoxia: Secondary | ICD-10-CM | POA: Diagnosis present

## 2021-05-22 DIAGNOSIS — Z9181 History of falling: Secondary | ICD-10-CM

## 2021-05-22 DIAGNOSIS — Z8619 Personal history of other infectious and parasitic diseases: Secondary | ICD-10-CM

## 2021-05-22 DIAGNOSIS — Z91041 Radiographic dye allergy status: Secondary | ICD-10-CM

## 2021-05-22 DIAGNOSIS — F32A Depression, unspecified: Secondary | ICD-10-CM | POA: Diagnosis present

## 2021-05-22 DIAGNOSIS — W06XXXA Fall from bed, initial encounter: Secondary | ICD-10-CM | POA: Diagnosis present

## 2021-05-22 DIAGNOSIS — M549 Dorsalgia, unspecified: Secondary | ICD-10-CM

## 2021-05-22 DIAGNOSIS — R7303 Prediabetes: Secondary | ICD-10-CM | POA: Diagnosis present

## 2021-05-22 DIAGNOSIS — Z79899 Other long term (current) drug therapy: Secondary | ICD-10-CM

## 2021-05-22 DIAGNOSIS — Z79890 Hormone replacement therapy (postmenopausal): Secondary | ICD-10-CM

## 2021-05-22 DIAGNOSIS — Z6841 Body Mass Index (BMI) 40.0 and over, adult: Secondary | ICD-10-CM

## 2021-05-22 DIAGNOSIS — R0602 Shortness of breath: Secondary | ICD-10-CM | POA: Diagnosis not present

## 2021-05-22 DIAGNOSIS — Z9114 Patient's other noncompliance with medication regimen: Secondary | ICD-10-CM

## 2021-05-22 DIAGNOSIS — G4733 Obstructive sleep apnea (adult) (pediatric): Secondary | ICD-10-CM | POA: Diagnosis present

## 2021-05-22 DIAGNOSIS — Z91199 Patient's noncompliance with other medical treatment and regimen due to unspecified reason: Secondary | ICD-10-CM

## 2021-05-22 DIAGNOSIS — E876 Hypokalemia: Secondary | ICD-10-CM | POA: Diagnosis present

## 2021-05-22 DIAGNOSIS — R739 Hyperglycemia, unspecified: Secondary | ICD-10-CM | POA: Diagnosis present

## 2021-05-22 DIAGNOSIS — M170 Bilateral primary osteoarthritis of knee: Secondary | ICD-10-CM | POA: Diagnosis present

## 2021-05-22 DIAGNOSIS — Z87891 Personal history of nicotine dependence: Secondary | ICD-10-CM

## 2021-05-22 DIAGNOSIS — Z7901 Long term (current) use of anticoagulants: Secondary | ICD-10-CM

## 2021-05-22 DIAGNOSIS — Y92003 Bedroom of unspecified non-institutional (private) residence as the place of occurrence of the external cause: Secondary | ICD-10-CM

## 2021-05-22 DIAGNOSIS — I5032 Chronic diastolic (congestive) heart failure: Secondary | ICD-10-CM | POA: Diagnosis present

## 2021-05-22 DIAGNOSIS — J302 Other seasonal allergic rhinitis: Secondary | ICD-10-CM | POA: Diagnosis present

## 2021-05-22 DIAGNOSIS — M069 Rheumatoid arthritis, unspecified: Secondary | ICD-10-CM | POA: Diagnosis present

## 2021-05-22 LAB — RESP PANEL BY RT-PCR (FLU A&B, COVID) ARPGX2
Influenza A by PCR: NEGATIVE
Influenza B by PCR: NEGATIVE
SARS Coronavirus 2 by RT PCR: NEGATIVE

## 2021-05-22 LAB — CBC
HCT: 40.8 % (ref 36.0–46.0)
Hemoglobin: 12.2 g/dL (ref 12.0–15.0)
MCH: 25.1 pg — ABNORMAL LOW (ref 26.0–34.0)
MCHC: 29.9 g/dL — ABNORMAL LOW (ref 30.0–36.0)
MCV: 83.8 fL (ref 80.0–100.0)
Platelets: 412 10*3/uL — ABNORMAL HIGH (ref 150–400)
RBC: 4.87 MIL/uL (ref 3.87–5.11)
RDW: 17.2 % — ABNORMAL HIGH (ref 11.5–15.5)
WBC: 13.9 10*3/uL — ABNORMAL HIGH (ref 4.0–10.5)
nRBC: 0 % (ref 0.0–0.2)

## 2021-05-22 LAB — TROPONIN I (HIGH SENSITIVITY)
Troponin I (High Sensitivity): 7 ng/L (ref <18)
Troponin I (High Sensitivity): 6 ng/L (ref <18)

## 2021-05-22 LAB — BASIC METABOLIC PANEL WITH GFR
Anion gap: 19 — ABNORMAL HIGH (ref 5–15)
BUN: <5 mg/dL — ABNORMAL LOW (ref 6–20)
CO2: 26 mmol/L (ref 22–32)
Calcium: 9.8 mg/dL (ref 8.9–10.3)
Chloride: 103 mmol/L (ref 98–111)
Creatinine, Ser: 0.7 mg/dL (ref 0.44–1.00)
GFR, Estimated: >60 mL/min (ref >60)
Glucose, Bld: 108 mg/dL — ABNORMAL HIGH (ref 70–99)
Potassium: 3.3 mmol/L — ABNORMAL LOW (ref 3.5–5.1)
Sodium: 148 mmol/L — ABNORMAL HIGH (ref 135–145)

## 2021-05-22 LAB — I-STAT BETA HCG BLOOD, ED (MC, WL, AP ONLY): I-stat hCG, quantitative: <5 m[IU]/mL (ref <5)

## 2021-05-22 NOTE — ED Provider Triage Note (Signed)
Emergency Medicine Provider Triage Evaluation Note  ZAHAVA QUANT , a 51 y.o. female  was evaluated in triage.  Pt complains of CP, SOB, and generalized weakness that started early today. History of COPD. Notes she fell 2 times today due to generalized weakness. No head injury. Denies LOC. She is on chronic Eliquis. Notes her legs hurt from falling. Denies unilateral weakness, speech changes, and visual changes. Review of Systems  Positive: CP, SOB, weakness Negative: Speech changes  Physical Exam  BP (!) 144/100 (BP Location: Left Arm)    Pulse (!) 101    Temp 98.2 F (36.8 C) (Oral)    Resp (!) 22    SpO2 100%  Gen:   Awake, no distress   Resp:  Normal effort  MSK:   Moves extremities without difficulty  Other:  AAOx4, normal speech, no facial droop  Medical Decision Making  Medically screening exam initiated at 5:32 PM.  Appropriate orders placed.  Racine AERIKA GROLL was informed that the remainder of the evaluation will be completed by another provider, this initial triage assessment does not replace that evaluation, and the importance of remaining in the ED until their evaluation is complete.  labs   Suzy Bouchard, [place] 05/22/21 1735

## 2021-05-22 NOTE — ED Triage Notes (Signed)
Pt reports her asthma is flaring up and also fell 3 times today. Endorses chest and leg pain.

## 2021-05-22 NOTE — ED Triage Notes (Signed)
Pt presents to ED BIB [facility] from home. Pt c/o generalized weakness and CP x3d. 2 falls today. EMS given 324 asa, nirto x1, NSR on EMS 12-lead. Cardiac hx EMS VS 142/88 HR - 110 RR- 20 97% RA CBG - 140

## 2021-05-23 ENCOUNTER — Other Ambulatory Visit: Payer: Self-pay

## 2021-05-23 ENCOUNTER — Ambulatory Visit: Payer: Medicare Other | Attending: Family Medicine | Admitting: Family Medicine

## 2021-05-23 ENCOUNTER — Emergency Department (HOSPITAL_COMMUNITY): Payer: Medicare Other

## 2021-05-23 ENCOUNTER — Encounter: Payer: Self-pay | Admitting: Internal Medicine

## 2021-05-23 DIAGNOSIS — J4551 Severe persistent asthma with (acute) exacerbation: Secondary | ICD-10-CM

## 2021-05-23 LAB — HEPATIC FUNCTION PANEL
ALT: 11 U/L (ref 0–44)
AST: 16 U/L (ref 15–41)
Albumin: 3.1 g/dL — ABNORMAL LOW (ref 3.5–5.0)
Alkaline Phosphatase: 87 U/L (ref 38–126)
Bilirubin, Direct: <0.1 mg/dL (ref 0.0–0.2)
Total Bilirubin: 0.3 mg/dL (ref 0.3–1.2)
Total Protein: 6.7 g/dL (ref 6.5–8.1)

## 2021-05-23 LAB — CBG MONITORING, ED: Glucose-Capillary: 226 mg/dL — ABNORMAL HIGH (ref 70–99)

## 2021-05-23 LAB — BRAIN NATRIURETIC PEPTIDE: B Natriuretic Peptide: 17.8 pg/mL (ref 0.0–100.0)

## 2021-05-23 MED ORDER — ACETAMINOPHEN 325 MG PO TABS
650.0000 mg | ORAL_TABLET | Freq: Four times a day (QID) | ORAL | Status: DC | PRN
  Administered 2021-05-24: 650 mg via ORAL
  Filled 2021-05-23 (×2): qty 2

## 2021-05-23 MED ORDER — MONTELUKAST SODIUM 10 MG PO TABS
10.0000 mg | ORAL_TABLET | Freq: Every day | ORAL | Status: DC
  Administered 2021-05-23 – 2021-05-25 (×3): 10 mg via ORAL
  Filled 2021-05-23 (×4): qty 1

## 2021-05-23 MED ORDER — LACTATED RINGERS IV BOLUS
1000.0000 mL | Freq: Once | INTRAVENOUS | Status: AC
Start: 2021-05-23 — End: 2021-05-23
  Administered 2021-05-23: 13:00:00 1000 mL via INTRAVENOUS

## 2021-05-23 MED ORDER — BENZONATATE 100 MG PO CAPS
100.0000 mg | ORAL_CAPSULE | Freq: Two times a day (BID) | ORAL | Status: DC
  Administered 2021-05-23 – 2021-05-26 (×6): 100 mg via ORAL
  Filled 2021-05-23 (×6): qty 1

## 2021-05-23 MED ORDER — DULOXETINE HCL 60 MG PO CPEP
60.0000 mg | ORAL_CAPSULE | Freq: Every day | ORAL | Status: DC
Start: 2021-05-24 — End: 2021-05-26
  Administered 2021-05-24 – 2021-05-26 (×3): 60 mg via ORAL
  Filled 2021-05-23 (×3): qty 1

## 2021-05-23 MED ORDER — APIXABAN 5 MG PO TABS
5.0000 mg | ORAL_TABLET | Freq: Two times a day (BID) | ORAL | Status: DC
  Administered 2021-05-23 – 2021-05-26 (×6): 5 mg via ORAL
  Filled 2021-05-23 (×6): qty 1

## 2021-05-23 MED ORDER — FUROSEMIDE 40 MG PO TABS
40.0000 mg | ORAL_TABLET | Freq: Every day | ORAL | Status: DC
  Administered 2021-05-23 – 2021-05-26 (×4): 40 mg via ORAL
  Filled 2021-05-23 (×2): qty 2
  Filled 2021-05-23 (×2): qty 1

## 2021-05-23 MED ORDER — ALBUTEROL SULFATE HFA 108 (90 BASE) MCG/ACT IN AERS: 2.0000 | INHALATION_SPRAY | Freq: Four times a day (QID) | RESPIRATORY_TRACT | Status: DC

## 2021-05-23 MED ORDER — INSULIN ASPART 100 UNIT/ML IJ SOLN
0.0000 [IU] | Freq: Every day | INTRAMUSCULAR | Status: DC
  Administered 2021-05-23: 22:00:00 2 [IU] via SUBCUTANEOUS

## 2021-05-23 MED ORDER — ALBUTEROL SULFATE (2.5 MG/3ML) 0.083% IN NEBU
2.5000 mg | INHALATION_SOLUTION | Freq: Four times a day (QID) | RESPIRATORY_TRACT | Status: DC
  Administered 2021-05-23 – 2021-05-25 (×6): 2.5 mg via RESPIRATORY_TRACT
  Filled 2021-05-23 (×7): qty 3

## 2021-05-23 MED ORDER — BUDESONIDE 0.5 MG/2ML IN SUSP
RESPIRATORY_TRACT | Status: AC
  Administered 2021-05-23: 19:00:00 0.5 mg
  Filled 2021-05-23: qty 2

## 2021-05-23 MED ORDER — IPRATROPIUM-ALBUTEROL 0.5-2.5 (3) MG/3ML IN SOLN
3.0000 mL | Freq: Once | RESPIRATORY_TRACT | Status: AC
Start: 2021-05-23 — End: 2021-05-23
  Administered 2021-05-23: 09:00:00 3 mL via RESPIRATORY_TRACT
  Filled 2021-05-23: qty 3

## 2021-05-23 MED ORDER — HYDRALAZINE HCL 20 MG/ML IJ SOLN
10.0000 mg | Freq: Four times a day (QID) | INTRAMUSCULAR | Status: DC | PRN
  Administered 2021-05-24 – 2021-05-25 (×3): 10 mg via INTRAVENOUS
  Filled 2021-05-23 (×4): qty 1

## 2021-05-23 MED ORDER — FLUTICASONE FUROATE-VILANTEROL 100-25 MCG/ACT IN AEPB
1.0000 | INHALATION_SPRAY | Freq: Every day | RESPIRATORY_TRACT | Status: DC
  Administered 2021-05-23 – 2021-05-26 (×2): 1 via RESPIRATORY_TRACT
  Filled 2021-05-23 (×2): qty 28

## 2021-05-23 MED ORDER — PREDNISONE 20 MG PO TABS: 40.0000 mg | ORAL_TABLET | Freq: Every day | ORAL | Status: DC

## 2021-05-23 MED ORDER — DIPHENHYDRAMINE HCL 50 MG/ML IJ SOLN: 25.0000 mg | Freq: Once | INTRAMUSCULAR | Status: DC

## 2021-05-23 MED ORDER — VITAMIN D (ERGOCALCIFEROL) 1.25 MG (50000 UNIT) PO CAPS: 50000.0000 [IU] | ORAL_CAPSULE | ORAL | Status: DC

## 2021-05-23 MED ORDER — ACETAMINOPHEN 650 MG RE SUPP: 650.0000 mg | Freq: Four times a day (QID) | RECTAL | Status: DC | PRN

## 2021-05-23 MED ORDER — SODIUM CHLORIDE 0.9 % IV SOLN
200.0000 mg | Freq: Once | INTRAVENOUS | Status: AC
  Administered 2021-05-23: 10:00:00 200 mg via INTRAVENOUS
  Filled 2021-05-23: qty 3.2

## 2021-05-23 MED ORDER — OXYCODONE-ACETAMINOPHEN 5-325 MG PO TABS
1.0000 | ORAL_TABLET | Freq: Four times a day (QID) | ORAL | Status: DC | PRN
  Administered 2021-05-23 – 2021-05-26 (×8): 1 via ORAL
  Filled 2021-05-23 (×7): qty 1

## 2021-05-23 MED ORDER — IPRATROPIUM-ALBUTEROL 0.5-2.5 (3) MG/3ML IN SOLN
3.0000 mL | Freq: Once | RESPIRATORY_TRACT | Status: AC
  Administered 2021-05-23: 16:00:00 3 mL via RESPIRATORY_TRACT
  Filled 2021-05-23: qty 3

## 2021-05-23 MED ORDER — POTASSIUM CHLORIDE CRYS ER 20 MEQ PO TBCR
40.0000 meq | EXTENDED_RELEASE_TABLET | Freq: Once | ORAL | Status: AC
  Administered 2021-05-23: 10:00:00 40 meq via ORAL
  Filled 2021-05-23: qty 2

## 2021-05-23 MED ORDER — PANTOPRAZOLE SODIUM 40 MG PO TBEC
40.0000 mg | DELAYED_RELEASE_TABLET | Freq: Every day | ORAL | Status: DC
  Administered 2021-05-23 – 2021-05-26 (×4): 40 mg via ORAL
  Filled 2021-05-23 (×4): qty 1

## 2021-05-23 MED ORDER — UMECLIDINIUM BROMIDE 62.5 MCG/ACT IN AEPB
1.0000 | INHALATION_SPRAY | Freq: Every day | RESPIRATORY_TRACT | Status: DC
  Administered 2021-05-26: 1 via RESPIRATORY_TRACT
  Filled 2021-05-23 (×2): qty 7

## 2021-05-23 MED ORDER — PREGABALIN 100 MG PO CAPS
300.0000 mg | ORAL_CAPSULE | Freq: Two times a day (BID) | ORAL | Status: DC
  Administered 2021-05-23 – 2021-05-26 (×6): 300 mg via ORAL
  Filled 2021-05-23 (×6): qty 3

## 2021-05-23 MED ORDER — METHYLPREDNISOLONE SODIUM SUCC 125 MG IJ SOLR
125.0000 mg | Freq: Once | INTRAMUSCULAR | Status: DC
  Filled 2021-05-23: qty 2

## 2021-05-23 MED ORDER — IPRATROPIUM-ALBUTEROL 0.5-2.5 (3) MG/3ML IN SOLN
3.0000 mL | Freq: Once | RESPIRATORY_TRACT | Status: AC
  Administered 2021-05-23: 17:00:00 3 mL via RESPIRATORY_TRACT
  Filled 2021-05-23: qty 3

## 2021-05-23 MED ORDER — LEVOTHYROXINE SODIUM 50 MCG PO TABS
50.0000 ug | ORAL_TABLET | Freq: Every day | ORAL | Status: DC
  Administered 2021-05-24 – 2021-05-26 (×3): 50 ug via ORAL
  Filled 2021-05-23 (×2): qty 1
  Filled 2021-05-23: qty 2

## 2021-05-23 MED ORDER — BUDESONIDE 0.5 MG/2ML IN SUSP
2.0000 mg | Freq: Two times a day (BID) | RESPIRATORY_TRACT | Status: DC
  Administered 2021-05-23 – 2021-05-25 (×4): 2 mg via RESPIRATORY_TRACT
  Filled 2021-05-23 (×4): qty 8

## 2021-05-23 MED ORDER — KETOROLAC TROMETHAMINE 30 MG/ML IJ SOLN
30.0000 mg | Freq: Once | INTRAMUSCULAR | Status: AC
Start: 2021-05-23 — End: 2021-05-23
  Administered 2021-05-23: 16:00:00 30 mg via INTRAVENOUS
  Filled 2021-05-23: qty 1

## 2021-05-23 MED ORDER — MAGNESIUM SULFATE 2 GM/50ML IV SOLN
2.0000 g | Freq: Once | INTRAVENOUS | Status: AC
  Administered 2021-05-23: 09:00:00 2 g via INTRAVENOUS
  Filled 2021-05-23: qty 50

## 2021-05-23 MED ORDER — IPRATROPIUM-ALBUTEROL 0.5-2.5 (3) MG/3ML IN SOLN
6.0000 mL | Freq: Once | RESPIRATORY_TRACT | Status: AC
  Administered 2021-05-23: 21:00:00 6 mL via RESPIRATORY_TRACT

## 2021-05-23 MED ORDER — INSULIN ASPART 100 UNIT/ML IJ SOLN
0.0000 [IU] | Freq: Three times a day (TID) | INTRAMUSCULAR | Status: DC
  Administered 2021-05-24: 2 [IU] via SUBCUTANEOUS
  Administered 2021-05-24: 1 [IU] via SUBCUTANEOUS
  Administered 2021-05-25 (×3): 2 [IU] via SUBCUTANEOUS

## 2021-05-23 MED ORDER — ATORVASTATIN CALCIUM 10 MG PO TABS
20.0000 mg | ORAL_TABLET | Freq: Every morning | ORAL | Status: DC
  Administered 2021-05-25 – 2021-05-26 (×2): 20 mg via ORAL
  Filled 2021-05-23 (×2): qty 2

## 2021-05-23 MED ORDER — IOHEXOL 350 MG/ML SOLN
100.0000 mL | Freq: Once | INTRAVENOUS | Status: AC | PRN
  Administered 2021-05-23: 15:00:00 100 mL via INTRAVENOUS

## 2021-05-23 MED ORDER — ZOLPIDEM TARTRATE 5 MG PO TABS
5.0000 mg | ORAL_TABLET | Freq: Every evening | ORAL | Status: DC | PRN
  Administered 2021-05-23 – 2021-05-25 (×3): 5 mg via ORAL
  Filled 2021-05-23 (×3): qty 1

## 2021-05-23 MED ORDER — DEXTROSE-NACL 5-0.45 % IV SOLN: INTRAVENOUS | Status: DC

## 2021-05-23 MED ORDER — DIPHENHYDRAMINE HCL 50 MG/ML IJ SOLN
50.0000 mg | Freq: Once | INTRAMUSCULAR | Status: AC
  Administered 2021-05-23: 13:00:00 50 mg via INTRAVENOUS
  Filled 2021-05-23: qty 1

## 2021-05-23 MED ORDER — TOPIRAMATE 25 MG PO TABS
200.0000 mg | ORAL_TABLET | Freq: Every day | ORAL | Status: DC
  Administered 2021-05-23 – 2021-05-25 (×3): 200 mg via ORAL
  Filled 2021-05-23 (×3): qty 8

## 2021-05-23 MED ORDER — AZITHROMYCIN 500 MG PO TABS
500.0000 mg | ORAL_TABLET | Freq: Every day | ORAL | Status: DC
  Administered 2021-05-24 – 2021-05-26 (×3): 500 mg via ORAL
  Filled 2021-05-23 (×3): qty 1

## 2021-05-23 MED ORDER — SODIUM CHLORIDE 0.9 % IV SOLN
500.0000 mg | INTRAVENOUS | Status: AC
  Administered 2021-05-23: 19:00:00 500 mg via INTRAVENOUS
  Filled 2021-05-23: qty 5

## 2021-05-23 MED ORDER — METHYLPREDNISOLONE SODIUM SUCC 40 MG IJ SOLR
40.0000 mg | Freq: Two times a day (BID) | INTRAMUSCULAR | Status: AC
  Administered 2021-05-23 – 2021-05-24 (×2): 40 mg via INTRAVENOUS
  Filled 2021-05-23 (×2): qty 1

## 2021-05-23 MED ORDER — CARVEDILOL 6.25 MG PO TABS
6.2500 mg | ORAL_TABLET | Freq: Two times a day (BID) | ORAL | Status: DC
  Administered 2021-05-23 – 2021-05-26 (×6): 6.25 mg via ORAL
  Filled 2021-05-23: qty 1
  Filled 2021-05-23 (×2): qty 2
  Filled 2021-05-23 (×3): qty 1

## 2021-05-23 NOTE — ED Notes (Signed)
Pt given [place] sandwich and gingerale. Sitting up in bed eating.

## 2021-05-23 NOTE — ED Notes (Signed)
Pt ambulated independently to bathroom, no distress noted.

## 2021-05-23 NOTE — ED Notes (Signed)
Patient transported to CT 

## 2021-05-23 NOTE — ED Notes (Signed)
Pt significant other brought cook out. Pt sitting up in bed eating food.

## 2021-05-23 NOTE — ED Notes (Signed)
Meal tray ordered 

## 2021-05-23 NOTE — ED Provider Notes (Addendum)
[facility] EMERGENCY DEPARTMENT Provider Note   CSN: [id number] Arrival date & time: 05/22/21  1704     History  Chief Complaint  Patient presents with   Shortness of Breath   Fall    [address] April [name] is a 51 y.o. female.  HPI 51 year old female with a history of GERD, paroxysmal A. fib (noncompliant with Eliquis), COPD/asthma, morbid obesity, hypertension, generalized anxiety presents to the ER with complaints of shortness of breath, weakness, chest pain and frequent falls which have been ongoing for the last several days.  Patient believes that she is having an asthma exacerbation, states that she was taking her home albuterol, Trelegy inhaler and prednisone with little relief.  She states that she feels very weak and has been falling out of her bed.  Denies any headache, numbness, unilateral weakness, facial droop, difficulty speaking. Denies any head injury or LOC.  She reports that it has been several weeks since taking her Eliquis, stating that "she has 13 pills to take and it is hard to stay on top of all of her pills".  She is unsure of any leg swelling.  On RA, no evidence of hypoxia. She does complains of bilateral knee pain due to her falls. No known head injury or LOC.  She denies any known fevers, cough, chills, hemoptysis.  No prior history of PE.    Home Medications Prior to Admission medications   Medication Sig Start Date End Date Taking? Authorizing Provider  albuterol (PROAIR HFA) 108 (90 Base) MCG/ACT inhaler INHALE 2 PUFFS EVERY 6 HOURS AS NEEDED 04/05/21   Baird Lyons D, MD  amLODipine (NORVASC) 10 MG tablet Take 1 tablet (10 mg total) by mouth daily. 02/20/21   [name], MD  apixaban (ELIQUIS) 5 MG TABS tablet Take 1 tablet (5 mg total) by mouth 2 (two) times daily. 02/20/21   [name], MD  atorvastatin (LIPITOR) 20 MG tablet Take 20 mg by mouth every morning. 10/02/20   [provider]  azithromycin (ZITHROMAX) 250 MG tablet  Take 1 tablet (250 mg total) by mouth daily. 02/08/21   Thurnell Lose, MD  baclofen (LIORESAL) 20 MG tablet Take 20 mg by mouth daily as needed for muscle spasms (pain). 01/08/21   [provider]  benzonatate (TESSALON) 100 MG capsule Take 1 capsule (100 mg total) by mouth every 8 (eight) hours. Patient taking differently: Take 100 mg by mouth 2 (two) times daily. 01/25/21   Alroy Bailiff, Margaux, PA-C  carvedilol (COREG) 6.25 MG tablet Take 1 tablet (6.25 mg total) by mouth 2 (two) times daily with a meal. 02/20/21   Newlin, Charlane Ferretti, MD  clonazePAM (KLONOPIN) 1 MG tablet 1 tab at bedtime for sleep as needed Patient taking differently: Take 1 mg by mouth at bedtime. 10/12/20   Baird Lyons D, MD  cloNIDine (CATAPRES) 0.2 MG tablet Take 1 tablet (0.2 mg total) by mouth 2 (two) times daily. 02/07/21   Thurnell Lose, MD  Diclofenac Sodium 3 % GEL Apply 1 application topically at bedtime. 01/08/21   [provider]  DULoxetine (CYMBALTA) 60 MG capsule Take 1 capsule (60 mg total) by mouth daily. 02/20/21   [name], MD  EPINEPHrine 0.3 mg/0.3 mL IJ SOAJ injection Inject 0.3 mg into the muscle as needed for anaphylaxis. Patient taking differently: Inject 0.3 mg into the muscle once as needed for anaphylaxis. 05/30/20   Deneise Lever, MD  fluticasone (FLONASE) 50 MCG/ACT nasal spray Place 1 spray into both  nostrils daily. Patient taking differently: Place 1 spray into both nostrils daily as needed for allergies or rhinitis. 08/12/18   [name], MD  Fluticasone-Umeclidin-Vilant (TRELEGY ELLIPTA) 100-62.5-25 MCG/INH AEPB Inhale 1 puff into the lungs daily. 01/23/21   Deneise Lever, MD  Fluticasone-Umeclidin-Vilant (TRELEGY ELLIPTA) 200-62.5-25 MCG/ACT AEPB Inhale 1 puff into the lungs daily. 02/15/21   Deneise Lever, MD  furosemide (LASIX) 40 MG tablet Take 1 tablet (40 mg total) by mouth daily. 02/23/20   [name], MD  ipratropium-albuterol (DUONEB) 0.5-2.5 (3)  MG/3ML SOLN Take 3 mLs by nebulization every 6 (six) hours as needed (shortness of breath/wheezing). 02/15/21   Deneise Lever, MD  levothyroxine (SYNTHROID) 50 MCG tablet Take 50 mcg by mouth daily. 10/15/20   [provider]  montelukast (SINGULAIR) 10 MG tablet TAKE 1 TABLET BY MOUTH AT BEDTIME. OFFICE VISIT NEEDED FOR ADDITIONAL REFILLS 02/27/21   [name], MD  naloxone [facility]) nasal spray 4 mg/0.1 mL Place 1 spray into the nose once as needed (opioid overdose). 08/09/20   [provider]  omeprazole (PRILOSEC) 40 MG capsule Take 40 mg by mouth daily. 01/08/21   [provider]  oxyCODONE-acetaminophen (PERCOCET) 10-325 MG tablet Take 1 tablet by mouth 5 (five) times daily. 02/09/19   [provider]  phentermine 15 MG capsule Take 15 mg by mouth daily. 11/08/20   [provider]  predniSONE (DELTASONE) 5 MG tablet Label  & dispense according to the schedule below. 10 Pills PO for 3 days then, 8 Pills PO for 3 days, 6 Pills PO for 3 days, 4 Pills PO for 3 days, 2 Pills PO for 3 days, 1 Pills PO for 3 days, 1/2 Pill  PO for 3 days then STOP. Total 95 pills. 02/07/21   Thurnell Lose, MD  pregabalin (LYRICA) 300 MG capsule Take 300 mg by mouth 2 (two) times daily. 01/08/21   [provider]  Semaglutide, 1 MG/DOSE, (OZEMPIC, 1 MG/DOSE,) 4 MG/3ML SOPN Inject 1 mg into the skin every Monday.    [provider]  sertraline (ZOLOFT) 50 MG tablet Take 50 mg by mouth at bedtime. 01/21/21   [provider]  tezepelumab-ekko (TEZSPIRE) 210 MG/1.91ML syringe Inject 210 mg into the skin every 28 (twenty-eight) days. 04/12/21   [provider]  topiramate (TOPAMAX) 200 MG tablet Take 200 mg by mouth at bedtime. 01/08/21   [provider]  Vitamin D, Ergocalciferol, (DRISDOL) 1.25 MG (50000 UT) CAPS capsule TAKE 1 CAPSULE BY MOUTH 1 TIME A WEEK Patient taking differently: Take 50,000 Units by mouth every Monday.  11/23/18   [name], MD  VYVANSE 70 MG capsule Take 70 mg by mouth daily. 01/20/19   [provider]  zolpidem (AMBIEN) 10 MG tablet TAKE 1 TABLET BY MOUTH AT BEDTIME AS NEEDED FOR SLEEP Patient taking differently: Take 10 mg by mouth at bedtime. 01/26/21   Young, Tarri Fuller D, MD  mometasone-formoterol (DULERA) 200-5 MCG/ACT AERO Inhale 2 puffs into the lungs 2 (two) times daily. Dx: Asthma 04/08/19 08/12/19  Deneise Lever, MD      Allergies    Contrast media [iodinated contrast media]    Review of Systems   Review of Systems Ten systems reviewed and are negative for acute change, except as noted in the HPI.   Physical Exam Updated Vital Signs BP 103/82    Pulse 65    Temp 98.2 F (36.8 C) (Oral)    Resp 20  SpO2 100%  Physical Exam Vitals and nursing note reviewed.  Constitutional:      General: She is not in acute distress.    Appearance: She is well-developed. She is obese.  HENT:     Head: Normocephalic and atraumatic.  Eyes:     Conjunctiva/sclera: Conjunctivae normal.  Cardiovascular:     Rate and Rhythm: Normal rate and regular rhythm.     Heart sounds: No murmur heard. Pulmonary:     Effort: Pulmonary effort is normal. Tachypnea present. No accessory muscle usage or respiratory distress.     Breath sounds: Examination of the right-upper field reveals wheezing. Examination of the left-upper field reveals wheezing. Examination of the right-lower field reveals wheezing. Examination of the left-lower field reveals wheezing. Wheezing present.  Chest:     Chest wall: No tenderness.  Abdominal:     Palpations: Abdomen is soft.     Tenderness: There is no abdominal tenderness.  Musculoskeletal:        General: No swelling.     Cervical back: Neck supple.     Right lower leg: No edema.     Left lower leg: No edema.     Comments: No tenderness to palpation to both knee joints, full flexion and extension bilaterally, no overlying erythema, warm, swelling.  Sensations intact. No noticeable unilateral calf swelling/ calf tenderness   Skin:    General: Skin is warm and dry.     Capillary Refill: Capillary refill takes less than 2 seconds.  Neurological:     General: No focal deficit present.     Mental Status: She is alert.  Psychiatric:        Mood and Affect: Mood normal.    ED Results / Procedures / Treatments   Labs (all labs ordered are listed, but only abnormal results are displayed) Labs Reviewed  BASIC METABOLIC PANEL - Abnormal; Notable for the following components:      Result Value   Sodium 148 (*)    Potassium 3.3 (*)    Glucose, Bld 108 (*)    BUN <5 (*)    Anion gap 19 (*)    All other components within normal limits  CBC - Abnormal; Notable for the following components:   WBC 13.9 (*)    MCH 25.1 (*)    MCHC 29.9 (*)    RDW 17.2 (*)    Platelets 412 (*)    All other components within normal limits  HEPATIC FUNCTION PANEL - Abnormal; Notable for the following components:   Albumin 3.1 (*)    All other components within normal limits  RESP PANEL BY RT-PCR (FLU A&B, COVID) [id number]  BRAIN NATRIURETIC PEPTIDE  I-STAT BETA HCG BLOOD, ED ([facility], [facility], [facility] ONLY)  TROPONIN I (HIGH SENSITIVITY)  TROPONIN I (HIGH SENSITIVITY)    EKG EKG Interpretation  Date/Time:  Tuesday May 22 2021 17:19:25 EST Ventricular Rate:  96 PR Interval:  144 QRS Duration: 88 QT Interval:  386 QTC Calculation: 487 R Axis:   -45 Text Interpretation: Normal sinus rhythm Left axis deviation Minimal voltage criteria for LVH, may be normal variant ( Cornell product ) Prolonged QT Abnormal ECG When compared with ECG of 05-Feb-2021 23:37, QT has lengthened Confirmed by Delora Fuel ([id number]) on 05/23/2021 2:40:58 AM  Radiology DG Chest 2 View  Result Date: 05/22/2021 CLINICAL DATA:  Chest pain and shortness of breath. Weakness with 2 falls today. EXAM: CHEST - 2 VIEW COMPARISON:  02/06/2021 FINDINGS: The cardiomediastinal contours are normal.  Bronchial thickening appears chronic. Pulmonary vasculature is normal. No consolidation, pleural effusion, or pneumothorax. No acute osseous abnormalities are seen. IMPRESSION: No acute chest findings.  Chronic hyperinflation. Electronically Signed   By: Keith Rake M.D.   On: 05/22/2021 17:59   CT Angio Chest PE W and/or Wo Contrast  Result Date: 05/23/2021 CLINICAL DATA:  Chest pain and shortness of breath. Generalized weakness since earlier today. [place] 2 times today due to the weakness. Taking Eliquis. Ex-smoker. EXAM: CT ANGIOGRAPHY CHEST WITH CONTRAST TECHNIQUE: Multidetector CT imaging of the chest was performed using the standard protocol during bolus administration of intravenous contrast. Multiplanar CT image reconstructions and MIPs were obtained to evaluate the vascular anatomy. RADIATION DOSE REDUCTION: This exam was performed according to the departmental dose-optimization program which includes automated exposure control, adjustment of the mA and/or kV according to patient size and/or use of iterative reconstruction technique. CONTRAST:  170mL OMNIPAQUE IOHEXOL 350 MG/ML SOLN COMPARISON:  10/08/2017 FINDINGS: Cardiovascular: Normally opacified pulmonary arteries with no pulmonary arterial filling defects seen. Mildly enlarged heart. Small amount of left anterior descending coronary artery calcification. Mediastinum/Nodes: No enlarged mediastinal, hilar, or axillary lymph nodes. Thyroid gland, trachea, and esophagus demonstrate no significant findings. Lungs/Pleura: Clear lungs with mild bilateral centrilobular and paraseptal bullous changes. No lung nodules or pleural fluid. Upper Abdomen: Unremarkable. Musculoskeletal: Mild lower thoracic spine degenerative changes. Patchy sclerosis involving the majority of the C7 and T1 vertebrae. There is some sclerosis involving the posterior aspect of the C6 vertebra. These changes have progressed since 10/08/2017, when there was milder, more patchy  sclerosis involving those vertebrae at that time. There are mild lower cervical spine degenerative changes. Review of the MIP images confirms the above findings. IMPRESSION: 1. No pulmonary emboli or acute abnormality. 2. Mild bilateral centrilobular and paraseptal bullous emphysema. 3. Mild atheromatous calcifications involving the left anterior descending coronary artery. 4. Progressive changes of discogenic sclerosis involving the C6, C7 and T1 vertebrae. Emphysema (ICD10-J43.9). Electronically Signed   By: Claudie Revering M.D.   On: 05/23/2021 15:11    Procedures Procedures    Medications Ordered in ED Medications  ipratropium-albuterol (DUONEB) 0.5-2.5 (3) MG/3ML nebulizer solution 3 mL (has no administration in time range)  ketorolac (TORADOL) 30 MG/ML injection 30 mg (has no administration in time range)  ipratropium-albuterol (DUONEB) 0.5-2.5 (3) MG/3ML nebulizer solution 3 mL (3 mLs Nebulization Given 05/23/21 0848)  methylPREDNISolone sodium succinate (SOLU-MEDROL) 200 mg in sodium chloride 0.9 % 50 mL IVPB (0 mg Intravenous Stopped 05/23/21 1237)  magnesium sulfate IVPB 2 g 50 mL (0 g Intravenous Stopped 05/23/21 0955)  diphenhydrAMINE (BENADRYL) injection 50 mg (50 mg Intravenous Given 05/23/21 1305)  potassium chloride SA (KLOR-CON M) CR tablet 40 mEq (40 mEq Oral Given 05/23/21 0952)  lactated ringers bolus 1,000 mL (0 mLs Intravenous Stopped 05/23/21 1456)  iohexol (OMNIPAQUE) 350 MG/ML injection 100 mL (100 mLs Intravenous Contrast Given 05/23/21 1452)    ED Course/ Medical Decision Making/ A&P                           Medical Decision Making Amount and/or Complexity of Data Reviewed Labs: ordered. Radiology: ordered.  Risk Prescription drug management.  51 year old female with a history of COPD/asthma, atrial fibrillation not compliant with anticoagulation, obesity, hypertension, major depressive disorder presented with shortness of breath, with concern for asthma exacerbation  and frequent falls.  On arrival, the patient is mildly tachypneic, with audible wheezes, however still able to  speak in full sentences.  She is hypertensive with a blood pressure of 144/100 on arrival, tachypneic with a rate in the 30s, O2 sat of 97% on room air, afebrile.  Physical exam with scattered wheezes in all lung fields, no significant rhonchi or rales.  No notable lower extremity edema or unilateral swelling however difficult to assess given patient's body habitus.  This patient presents to the ED for concern of shortness of breath/ asthma exacerbation, this involves an extensive number of treatment options, and is a complaint that carries with it a high risk of complications and morbidity.  The differential diagnosis includes asthma/COPD exacerbation, pneumonia, PE, ACS, new onset heart failure, pneumothorax, pericardial effusion   Co morbidities that complicate the patient evaluation  Obesity, medication noncompliance, COPD/asthma, atrial fibrillation   Additional history obtained:  Additional history obtained from chart review, patient and significant other at bedside   Lab Tests:  I Ordered, and personally interpreted labs.  The pertinent results include: CBC with a leukocytosis of 13.9, stable hemoglobin of 12.2.  BMP with mild hyponatremia 148, potassium 3.3, normal creatinine, elevated anion gap of 19 but no acidosis.  Elevated flu are negative.  Delta troponins negative.  BNP normal.      Imaging Studies ordered:  I ordered imaging studies including chest x-ray, PE study I independently visualized and interpreted imaging which showed : PE study negative for acute PE I agree with the radiologist interpretation   Cardiac Monitoring:  The patient was maintained on a cardiac monitor.  I personally viewed and interpreted the cardiac monitored which showed an underlying rhythm of: Normal sinus rhythm   Medicines ordered and prescription drug management:  I ordered  medication including DuoNeb treatment, Solu-Medrol, magnesium sulfate, LR bolus.  Patient has a listed allergy to contrast dye, in the setting of needing a CTA PE study, 200 mg of Solu-Medrol and 50 mg of Benadryl were given Reevaluation of the patient after these medicines showed that the patient improved I have reviewed the patients home medicines and have made adjustments as needed   Test Considered:  CT PE study, listed allergy to contrast dye, received pretreatment X-rays of knees, patient has full ROM, no tibial plateau tenderness,no tenderness on exam, do not think these are indicated at this time  CT head considered however patient has no focal neuro deficits to suggest acute stroke    Critical Interventions:  Asthma exacerbation treatment including DuoNeb, steroids, magnesium.  LR bolus for mild hyponatremia and elevated anion gap likely in the setting of dehydration    Problem List / ED Course:  Patient was given magnesium, DuoNeb, potassium, LR bolus.  CT PE study negative.  Patient reports minimal improvement with treatment.  Plan for admission for moderate asthma exacerbation.   Reevaluation:  After the interventions noted above, I reevaluated the patient and found that they have :stayed the same   Dispostion:  After consideration of the diagnostic results and the patients response to treatment, I feel that the patent would benefit from admission for further management of asthma exacerbation, physical therapy. Consulted hospitalist team for admission    Final Clinical Impression(s) / ED Diagnoses Final diagnoses:  Moderate persistent asthma with exacerbation  Hypernatremia  Hypokalemia    Rx / DC Orders ED Discharge Orders     None             Garald Balding, PA-C 05/23/21 [place], DO 05/23/21 1752

## 2021-05-23 NOTE — H&P (Signed)
[facility] History and Physical  April [name] ZSW:[id number] DOB: 05/24/1970 DOA: 05/22/2021 PCP: [name], MD  Admitted from: Home Chief Complaint: Generalized weakness, chest pain  History of Present Illness: April [name] is a 51 y.o. female with PMH significant for morbid obesity, OSA noncompliant to CPAP, prediabetes, HTN, asthma, COPD, paroxysmal A. fib with noncompliance to Eliquis, chronic diastolic CHF, bilateral lower extremity edema, rheumatoid arthritis, anxiety/depression. Patient presented to the ED by EMS on 1/24 for shortness of breath generalized weakness, chest pain for 3 days, 2 episodes of falls.  She believes she is having an episode of asthma exacerbation that did not improve with bronchodilators at home. Chest pain, EMS gave her 1 dose of aspirin 324 mg, 1 dose of sublingual nitroglycerin.  In the ED, patient was afebrile, heart rate 101, respiratory 22, blood pressure 144/100, breathing on room air with O2 sat at 100%. Labs with sodium 148, potassium 3.3, BUN/creatinine 5/0.7, WC count elevated to 13.9, platelet 412 Respiratory virus panel negative Quantitative beta-hCG negative CT angio chest did not show any evidence of pulm embolism.  But showed mild bilateral centrilobular and paraseptal bullous emphysema.    Hospitalist service was consulted for further evaluation management.  At the time of my evaluation patient was lying down in stretcher.  She is not on supplemental oxygen.  She had mild audible wheezing. She stated that she is noncompliant to CPAP at home because she does not like the feeling of the mask.  She states she takes Ambien 10 mg nightly for good sleep. Quit smoking 10 years ago.    Review of Systems:  All systems were reviewed and were negative unless otherwise mentioned in the HPI.   Past medical history: Past Medical History:  Diagnosis Date   Acanthosis nigricans    Anxiety    Arthritis    "knees" (04/28/2017)   Asthma     Followed by Dr. Annamaria Boots (pulmonology); receives every other week omalizumab injections; has frequent exacerbations   Back pain    Chronic diastolic CHF (congestive heart failure) ([place]) 01/17/2017   COPD (chronic obstructive pulmonary disease) ([place])    PFTs in 2002, FEV1/FVC 65, no post bronchodilater test done   Depression    GERD (gastroesophageal reflux disease)    Headache(784.0)    "q couple days" (32/20/2542)   Helicobacter pylori (H. pylori) infection    Hypertension, essential    Insomnia    Joint pain    Lower extremity edema    Menorrhagia    Morbid obesity ([place])    OSA on CPAP    Sleep study 2008 - mild OSA, not enough events to titrate CPAP; wears CPAP now/pt on 04/28/2017   Pneumonia X 1   Prediabetes    Rheumatoid arthritis (HCC)    Seasonal allergies    Shortness of breath    Tobacco user    Vitamin D deficiency     Past surgical history: Past Surgical History:  Procedure Laterality Date   CARDIOVERSION N/A 05/30/2017   Procedure: CARDIOVERSION;  Surgeon: Sanda Klein, MD;  Location: [place] [facility];  Service: Cardiovascular;  Laterality: N/A;   REDUCTION MAMMAPLASTY Bilateral 09/2011   TUBAL LIGATION  1996   bilateral    Social History:  reports that she quit smoking about 6 years ago. Her smoking use included cigarettes. She has a 13.00 pack-year smoking history. She has never used smokeless tobacco. She reports that she does not drink alcohol and does not use drugs.  Allergies:  Allergies  Allergen Reactions   Contrast Media [Iodinated Contrast Media] Itching    Ct contrast    Family history:  Family History  Problem Relation Age of Onset   Hypertension Mother    Asthma Daughter    Cancer Paternal Aunt    Asthma Maternal Grandmother      Home Meds: Prior to Admission medications   Medication Sig Start Date End Date Taking? Authorizing Provider  albuterol (PROAIR HFA) 108 (90 Base) MCG/ACT inhaler INHALE 2 PUFFS EVERY 6 HOURS AS NEEDED 04/05/21    Baird Lyons D, MD  amLODipine (NORVASC) 10 MG tablet Take 1 tablet (10 mg total) by mouth daily. 02/20/21   [name], MD  apixaban (ELIQUIS) 5 MG TABS tablet Take 1 tablet (5 mg total) by mouth 2 (two) times daily. 02/20/21   [name], MD  atorvastatin (LIPITOR) 20 MG tablet Take 20 mg by mouth every morning. 10/02/20   [provider]  azithromycin (ZITHROMAX) 250 MG tablet Take 1 tablet (250 mg total) by mouth daily. 02/08/21   Thurnell Lose, MD  baclofen (LIORESAL) 20 MG tablet Take 20 mg by mouth daily as needed for muscle spasms (pain). 01/08/21   [provider]  benzonatate (TESSALON) 100 MG capsule Take 1 capsule (100 mg total) by mouth every 8 (eight) hours. Patient taking differently: Take 100 mg by mouth 2 (two) times daily. 01/25/21   Alroy Bailiff, Margaux, PA-C  carvedilol (COREG) 6.25 MG tablet Take 1 tablet (6.25 mg total) by mouth 2 (two) times daily with a meal. 02/20/21   Newlin, Charlane Ferretti, MD  clonazePAM (KLONOPIN) 1 MG tablet 1 tab at bedtime for sleep as needed Patient taking differently: Take 1 mg by mouth at bedtime. 10/12/20   Baird Lyons D, MD  cloNIDine (CATAPRES) 0.2 MG tablet Take 1 tablet (0.2 mg total) by mouth 2 (two) times daily. 02/07/21   Thurnell Lose, MD  Diclofenac Sodium 3 % GEL Apply 1 application topically at bedtime. 01/08/21   [provider]  DULoxetine (CYMBALTA) 60 MG capsule Take 1 capsule (60 mg total) by mouth daily. 02/20/21   [name], MD  EPINEPHrine 0.3 mg/0.3 mL IJ SOAJ injection Inject 0.3 mg into the muscle as needed for anaphylaxis. Patient taking differently: Inject 0.3 mg into the muscle once as needed for anaphylaxis. 05/30/20   Deneise Lever, MD  fluticasone (FLONASE) 50 MCG/ACT nasal spray Place 1 spray into both nostrils daily. Patient taking differently: Place 1 spray into both nostrils daily as needed for allergies or rhinitis. 08/12/18   [name], MD  Fluticasone-Umeclidin-Vilant  (TRELEGY ELLIPTA) 100-62.5-25 MCG/INH AEPB Inhale 1 puff into the lungs daily. 01/23/21   Deneise Lever, MD  Fluticasone-Umeclidin-Vilant (TRELEGY ELLIPTA) 200-62.5-25 MCG/ACT AEPB Inhale 1 puff into the lungs daily. 02/15/21   Deneise Lever, MD  furosemide (LASIX) 40 MG tablet Take 1 tablet (40 mg total) by mouth daily. 02/23/20   [name], MD  ipratropium-albuterol (DUONEB) 0.5-2.5 (3) MG/3ML SOLN Take 3 mLs by nebulization every 6 (six) hours as needed (shortness of breath/wheezing). 02/15/21   Deneise Lever, MD  levothyroxine (SYNTHROID) 50 MCG tablet Take 50 mcg by mouth daily. 10/15/20   [provider]  montelukast (SINGULAIR) 10 MG tablet TAKE 1 TABLET BY MOUTH AT BEDTIME. OFFICE VISIT NEEDED FOR ADDITIONAL REFILLS 02/27/21   [name], MD  naloxone [facility]) nasal spray 4 mg/0.1 mL Place 1 spray into the nose once as needed (opioid overdose). 08/09/20   [provider]  omeprazole (PRILOSEC) 40 MG capsule Take 40 mg by mouth daily. 01/08/21   [provider]  oxyCODONE-acetaminophen (PERCOCET) 10-325 MG tablet Take 1 tablet by mouth 5 (five) times daily. 02/09/19   [provider]  phentermine 15 MG capsule Take 15 mg by mouth daily. 11/08/20   [provider]  predniSONE (DELTASONE) 5 MG tablet Label  & dispense according to the schedule below. 10 Pills PO for 3 days then, 8 Pills PO for 3 days, 6 Pills PO for 3 days, 4 Pills PO for 3 days, 2 Pills PO for 3 days, 1 Pills PO for 3 days, 1/2 Pill  PO for 3 days then STOP. Total 95 pills. 02/07/21   Thurnell Lose, MD  pregabalin (LYRICA) 300 MG capsule Take 300 mg by mouth 2 (two) times daily. 01/08/21   [provider]  Semaglutide, 1 MG/DOSE, (OZEMPIC, 1 MG/DOSE,) 4 MG/3ML SOPN Inject 1 mg into the skin every Monday.    [provider]  sertraline (ZOLOFT) 50 MG tablet Take 50 mg by mouth at bedtime. 01/21/21   [provider]  tezepelumab-ekko (TEZSPIRE)  210 MG/1.91ML syringe Inject 210 mg into the skin every 28 (twenty-eight) days. 04/12/21   [provider]  topiramate (TOPAMAX) 200 MG tablet Take 200 mg by mouth at bedtime. 01/08/21   [provider]  Vitamin D, Ergocalciferol, (DRISDOL) 1.25 MG (50000 UT) CAPS capsule TAKE 1 CAPSULE BY MOUTH 1 TIME A WEEK Patient taking differently: Take 50,000 Units by mouth every Monday. 11/23/18   [name], MD  VYVANSE 70 MG capsule Take 70 mg by mouth daily. 01/20/19   [provider]  zolpidem (AMBIEN) 10 MG tablet TAKE 1 TABLET BY MOUTH AT BEDTIME AS NEEDED FOR SLEEP Patient taking differently: Take 10 mg by mouth at bedtime. 01/26/21   Young, Tarri Fuller D, MD  mometasone-formoterol (DULERA) 200-5 MCG/ACT AERO Inhale 2 puffs into the lungs 2 (two) times daily. Dx: Asthma 04/08/19 08/12/19  Baird Lyons D, MD    Physical Exam: Vitals:   05/23/21 1456 05/23/21 1535 05/23/21 1600 05/23/21 1630  BP: 103/82 (!) 170/103 (!) 143/110 (!) 163/127  Pulse: 65 99 68 (!) 104  Resp: 20 (!) 22 (!) 22 (!) 25  Temp:  97.8 F (36.6 C)    TempSrc:  Oral    SpO2: 100% 98% (!) 76% 98%   Wt Readings from Last 3 Encounters:  05/10/21 (!) 150 kg  04/12/21 (!) 147.3 kg  03/07/21 (!) 145.1 kg   There is no height or weight on file to calculate BMI.  General exam: Morbidly obese middle-aged African-American female.  Not in physical distress Skin: No rashes, lesions or ulcers. HEENT: Atraumatic, normocephalic, no obvious bleeding Lungs: Mild audible wheezing, diminished air entry in both bases CVS: Regular rate and rhythm, no murmur GI/Abd soft, nontender, distended from obesity, bowel sound present CNS: Alert, awake, oriented x3 Psychiatry: Mood appropriate Extremities: No pedal edema, no calf tenderness     Consult Orders  (From admission, onward)           Start     Ordered   Unscheduled  PT eval and treat  Routine        05/23/21 1804            Labs on  Admission:   CBC: Recent Labs  Lab 05/22/21 1727  WBC 13.9*  HGB 12.2  HCT 40.8  MCV 83.8  PLT 412*    Basic Metabolic  Panel: Recent Labs  Lab 05/22/21 1727  NA 148*  K 3.3*  CL 103  CO2 26  GLUCOSE 108*  BUN <5*  CREATININE 0.70  CALCIUM 9.8    Liver Function Tests: Recent Labs  Lab 05/23/21 0841  AST 16  ALT 11  ALKPHOS 87  BILITOT 0.3  PROT 6.7  ALBUMIN 3.1*   No results for input(s): LIPASE, AMYLASE in the last 168 hours. No results for input(s): AMMONIA in the last 168 hours.  Cardiac Enzymes: No results for input(s): CKTOTAL, CKMB, CKMBINDEX, TROPONINI in the last 168 hours.  BNP (last 3 results) Recent Labs    02/06/21 0037 02/07/21 0121 05/23/21 0841  BNP 30.2 30.8 17.8    ProBNP (last 3 results) No results for input(s): PROBNP in the last 8760 hours.  CBG: No results for input(s): GLUCAP in the last 168 hours.  Lipase     Component Value Date/Time   LIPASE 20 03/29/2017 1705     Urinalysis    Component Value Date/Time   COLORURINE YELLOW 02/04/2021 0924   APPEARANCEUR HAZY (A) 02/04/2021 0924   LABSPEC 1.012 02/04/2021 0924   PHURINE 6.0 02/04/2021 0924   GLUCOSEU NEGATIVE 02/04/2021 0924   GLUCOSEU NEG mg/dL 10/28/2007 2049   HGBUR NEGATIVE 02/04/2021 0924   BILIRUBINUR NEGATIVE 02/04/2021 0924   KETONESUR NEGATIVE 02/04/2021 0924   PROTEINUR NEGATIVE 02/04/2021 0924   UROBILINOGEN 1.0 11/21/2014 0707   NITRITE NEGATIVE 02/04/2021 0924   LEUKOCYTESUR NEGATIVE 02/04/2021 0924     Drugs of Abuse     Component Value Date/Time   LABOPIA NONE DETECTED 05/30/2017 2341   COCAINSCRNUR NONE DETECTED 05/30/2017 2341   LABBENZ NONE DETECTED 05/30/2017 2341   AMPHETMU NONE DETECTED 05/30/2017 2341   THCU NONE DETECTED 05/30/2017 2341   LABBARB NONE DETECTED 05/30/2017 2341      Radiological Exams on Admission: DG Chest 2 View  Result Date: 05/22/2021 CLINICAL DATA:  Chest pain and shortness of breath. Weakness with 2 falls  today. EXAM: CHEST - 2 VIEW COMPARISON:  02/06/2021 FINDINGS: The cardiomediastinal contours are normal. Bronchial thickening appears chronic. Pulmonary vasculature is normal. No consolidation, pleural effusion, or pneumothorax. No acute osseous abnormalities are seen. IMPRESSION: No acute chest findings.  Chronic hyperinflation. Electronically Signed   By: Keith Rake M.D.   On: 05/22/2021 17:59   CT Angio Chest PE W and/or Wo Contrast  Result Date: 05/23/2021 CLINICAL DATA:  Chest pain and shortness of breath. Generalized weakness since earlier today. [place] 2 times today due to the weakness. Taking Eliquis. Ex-smoker. EXAM: CT ANGIOGRAPHY CHEST WITH CONTRAST TECHNIQUE: Multidetector CT imaging of the chest was performed using the standard protocol during bolus administration of intravenous contrast. Multiplanar CT image reconstructions and MIPs were obtained to evaluate the vascular anatomy. RADIATION DOSE REDUCTION: This exam was performed according to the departmental dose-optimization program which includes automated exposure control, adjustment of the mA and/or kV according to patient size and/or use of iterative reconstruction technique. CONTRAST:  1102mL OMNIPAQUE IOHEXOL 350 MG/ML SOLN COMPARISON:  10/08/2017 FINDINGS: Cardiovascular: Normally opacified pulmonary arteries with no pulmonary arterial filling defects seen. Mildly enlarged heart. Small amount of left anterior descending coronary artery calcification. Mediastinum/Nodes: No enlarged mediastinal, hilar, or axillary lymph nodes. Thyroid gland, trachea, and esophagus demonstrate no significant findings. Lungs/Pleura: Clear lungs with mild bilateral centrilobular and paraseptal bullous changes. No lung nodules or pleural fluid. Upper Abdomen: Unremarkable. Musculoskeletal: Mild lower thoracic spine degenerative changes. Patchy sclerosis involving the majority of the C7  and T1 vertebrae. There is some sclerosis involving the posterior aspect of  the C6 vertebra. These changes have progressed since 10/08/2017, when there was milder, more patchy sclerosis involving those vertebrae at that time. There are mild lower cervical spine degenerative changes. Review of the MIP images confirms the above findings. IMPRESSION: 1. No pulmonary emboli or acute abnormality. 2. Mild bilateral centrilobular and paraseptal bullous emphysema. 3. Mild atheromatous calcifications involving the left anterior descending coronary artery. 4. Progressive changes of discogenic sclerosis involving the C6, C7 and T1 vertebrae. Emphysema (ICD10-J43.9). Electronically Signed   By: Claudie Revering M.D.   On: 05/23/2021 15:11     ------------------------------------------------------------------------------------------------------ Assessment/Plan: Active Problems:   Asthma exacerbation  Acute exacerbation of COPD/asthma -Presented with progressive, chest pain, shortness of breath -Troponin negative.  Chest imaging negative for acute infiltrates. -May have viral exacerbation of COPD/asthma.  Has audible wheezing on examination and coughs on deep breathing.  WBC count elevated to 13.9.  Pending procalcitonin levels. -No fever.  No evidence of bacterial infection at this time.  Observe off antibiotics. -Start on Solu-Medrol IV, bronchodilators, incentive spirometry -Patient follows up with pulmonologist Dr. Vaughan Browner as an outpatient. -On chart review, I noted that on 1/12, patient received a dose of days Tezepelumab for asthma Recent Labs  Lab 05/22/21 1727  WBC 13.9*   Obstructive sleep apnea -Noncompliant to CPAP.  Patient states she will try it in the hospital.  Frequent falls -Patient mentions that in the morning hours, she gets drowsy and tends to fall.  I wonder if she is having these because of untreated CPAP.  Obtain PT eval.  Acute hypernatremia -Sodium level was elevated to 148 in the ED.  Unclear reason.  Patient has good oral intake and hydration.  Started on  D5 half NS for now.  Repeat sodium level tomorrow Recent Labs  Lab 05/22/21 1727  NA 148*   Prediabetes -A1c 5.9 -Patient seems to be on semaglutide at home.   -Blood sugar may run elevated because of steroids. Start on sliding scale insulin with Accu-Cheks No results for input(s): GLUCAP in the last 168 hours.  Chronic diastolic CHF Essential hypertension -Bilateral lower extremity edema -Obtain echocardiogram.  Paroxysmal A. Fib -I do not see any AV nodal blocking agent in her list of medicine  Rheumatoid arthritis -CT chest on admission howed progressive changes of discogenic sclerosis involving the C6, C7 and T1 vertebrae.  Anxiety/depression -Klonopin, Ambien at bedtime  Morbid obesity  -There is no height or weight on file to calculate BMI. Patient has been advised to make an attempt to improve diet and exercise patterns to aid in weight loss.  OSA -Nightly CPAP      Mobility: Encourage ambulation Code Status:   Code Status: Full Code full code DVT prophylaxis: Lovenox subcu Antimicrobials: None Fluid: D5 half NS at 75 mill per hour  Diet:  Diet Order             Diet regular Room service appropriate? Yes; Fluid consistency: Thin  Diet effective now                   Consultants: None Family Communication: Family at bedside    Dispo: The patient is from: Home              Anticipated d/c is to: Home              Anticipated d/c date is: 2 days  ------------------------------------------------------------------------------------- Severity of Illness: The appropriate patient  status for this patient is OBSERVATION. Observation status is judged to be reasonable and necessary in order to provide the required intensity of service to ensure the patient's safety. The patient's presenting symptoms, physical exam findings, and initial radiographic and laboratory data in the context of their medical condition is felt to place them at decreased risk for  further clinical deterioration. Furthermore, it is anticipated that the patient will be medically stable for discharge from the hospital within 2 midnights of admission.     Signed, Terrilee Croak, MD [facility] 05/23/2021

## 2021-05-23 NOTE — ED Notes (Signed)
Pt back from CT and requesting 2 bag lunches. Pt informed that we need CT results prior to eating.

## 2021-05-23 NOTE — ED Provider Notes (Signed)
Signout received on this 51 year old female with past medical history significant for asthma and COPD with recurrent exacerbations, paroxysmal A. fib, CHF who presented with complaint of shortness of breath.  At the time of signout her work-up is completed and she is just awaiting to be discussed with hospitalist for admission. Physical Exam  BP (!) 143/110    Pulse 68    Temp 97.8 F (36.6 C) (Oral)    Resp (!) 22    SpO2 (!) 76%    Procedures  Procedures  ED Course / MDM    Medical Decision Making Amount and/or Complexity of Data Reviewed Labs: ordered. Radiology: ordered.  Risk Prescription drug management. Decision regarding hospitalization.   Patient received multiple neb treatments, magnesium, Solu-Medrol without improvement in her symptoms.  Patient discussed with hospitalist who will evaluate patient for admission.       Evlyn Courier, PA-C 05/23/21 1702    Wyvonnia Dusky, MD 05/23/21 [phone]

## 2021-05-23 NOTE — ED Notes (Signed)
Pt's work of breathing is greatly improved. Resistant to CPAP use, wishing to take off, difficulty sleeping. Ambien given and hospital bed provided. RT called to possibly adjust CPAP settings.

## 2021-05-24 ENCOUNTER — Telehealth: Payer: Self-pay | Admitting: Family Medicine

## 2021-05-24 ENCOUNTER — Observation Stay (HOSPITAL_COMMUNITY): Payer: Medicare Other

## 2021-05-24 DIAGNOSIS — G4733 Obstructive sleep apnea (adult) (pediatric): Secondary | ICD-10-CM | POA: Diagnosis present

## 2021-05-24 DIAGNOSIS — J4541 Moderate persistent asthma with (acute) exacerbation: Secondary | ICD-10-CM | POA: Diagnosis present

## 2021-05-24 DIAGNOSIS — Z825 Family history of asthma and other chronic lower respiratory diseases: Secondary | ICD-10-CM | POA: Diagnosis not present

## 2021-05-24 DIAGNOSIS — J9601 Acute respiratory failure with hypoxia: Secondary | ICD-10-CM | POA: Diagnosis present

## 2021-05-24 DIAGNOSIS — Y92003 Bedroom of unspecified non-institutional (private) residence as the place of occurrence of the external cause: Secondary | ICD-10-CM | POA: Diagnosis not present

## 2021-05-24 DIAGNOSIS — Z8249 Family history of ischemic heart disease and other diseases of the circulatory system: Secondary | ICD-10-CM | POA: Diagnosis not present

## 2021-05-24 DIAGNOSIS — I480 Paroxysmal atrial fibrillation: Secondary | ICD-10-CM | POA: Diagnosis present

## 2021-05-24 DIAGNOSIS — F419 Anxiety disorder, unspecified: Secondary | ICD-10-CM | POA: Diagnosis present

## 2021-05-24 DIAGNOSIS — F32A Depression, unspecified: Secondary | ICD-10-CM | POA: Diagnosis present

## 2021-05-24 DIAGNOSIS — R0602 Shortness of breath: Secondary | ICD-10-CM | POA: Diagnosis present

## 2021-05-24 DIAGNOSIS — I110 Hypertensive heart disease with heart failure: Secondary | ICD-10-CM | POA: Diagnosis present

## 2021-05-24 DIAGNOSIS — J439 Emphysema, unspecified: Secondary | ICD-10-CM | POA: Diagnosis present

## 2021-05-24 DIAGNOSIS — F411 Generalized anxiety disorder: Secondary | ICD-10-CM | POA: Diagnosis present

## 2021-05-24 DIAGNOSIS — M069 Rheumatoid arthritis, unspecified: Secondary | ICD-10-CM | POA: Diagnosis present

## 2021-05-24 DIAGNOSIS — E559 Vitamin D deficiency, unspecified: Secondary | ICD-10-CM | POA: Diagnosis present

## 2021-05-24 DIAGNOSIS — Z9181 History of falling: Secondary | ICD-10-CM | POA: Diagnosis not present

## 2021-05-24 DIAGNOSIS — J4551 Severe persistent asthma with (acute) exacerbation: Secondary | ICD-10-CM | POA: Diagnosis not present

## 2021-05-24 DIAGNOSIS — Z9114 Patient's other noncompliance with medication regimen: Secondary | ICD-10-CM | POA: Diagnosis not present

## 2021-05-24 DIAGNOSIS — Z91199 Patient's noncompliance with other medical treatment and regimen due to unspecified reason: Secondary | ICD-10-CM | POA: Diagnosis not present

## 2021-05-24 DIAGNOSIS — Z6841 Body Mass Index (BMI) 40.0 and over, adult: Secondary | ICD-10-CM | POA: Diagnosis not present

## 2021-05-24 DIAGNOSIS — I5032 Chronic diastolic (congestive) heart failure: Secondary | ICD-10-CM | POA: Diagnosis present

## 2021-05-24 DIAGNOSIS — Z20822 Contact with and (suspected) exposure to covid-19: Secondary | ICD-10-CM | POA: Diagnosis present

## 2021-05-24 DIAGNOSIS — E876 Hypokalemia: Secondary | ICD-10-CM | POA: Diagnosis present

## 2021-05-24 DIAGNOSIS — Z87891 Personal history of nicotine dependence: Secondary | ICD-10-CM | POA: Diagnosis not present

## 2021-05-24 DIAGNOSIS — R9431 Abnormal electrocardiogram [ECG] [EKG]: Secondary | ICD-10-CM | POA: Diagnosis not present

## 2021-05-24 DIAGNOSIS — W06XXXA Fall from bed, initial encounter: Secondary | ICD-10-CM | POA: Diagnosis present

## 2021-05-24 DIAGNOSIS — E870 Hyperosmolality and hypernatremia: Secondary | ICD-10-CM | POA: Diagnosis present

## 2021-05-24 DIAGNOSIS — Z8619 Personal history of other infectious and parasitic diseases: Secondary | ICD-10-CM | POA: Diagnosis not present

## 2021-05-24 LAB — BASIC METABOLIC PANEL
Anion gap: 15 (ref 5–15)
BUN: 6 mg/dL (ref 6–20)
CO2: 22 mmol/L (ref 22–32)
Calcium: 8.5 mg/dL — ABNORMAL LOW (ref 8.9–10.3)
Chloride: 103 mmol/L (ref 98–111)
Creatinine, Ser: 0.84 mg/dL (ref 0.44–1.00)
GFR, Estimated: >60 mL/min (ref >60)
Glucose, Bld: 174 mg/dL — ABNORMAL HIGH (ref 70–99)
Potassium: 3.7 mmol/L (ref 3.5–5.1)
Sodium: 140 mmol/L (ref 135–145)

## 2021-05-24 LAB — HEMOGLOBIN A1C
Hgb A1c MFr Bld: 5.9 % — ABNORMAL HIGH (ref 4.8–5.6)
Mean Plasma Glucose: 123 mg/dL

## 2021-05-24 LAB — CBG MONITORING, ED
Glucose-Capillary: 233 mg/dL — ABNORMAL HIGH (ref 70–99)
Glucose-Capillary: 156 mg/dL — ABNORMAL HIGH (ref 70–99)

## 2021-05-24 LAB — CBC
HCT: 35.4 % — ABNORMAL LOW (ref 36.0–46.0)
Hemoglobin: 10.8 g/dL — ABNORMAL LOW (ref 12.0–15.0)
MCH: 25.5 pg — ABNORMAL LOW (ref 26.0–34.0)
MCHC: 30.5 g/dL (ref 30.0–36.0)
MCV: 83.5 fL (ref 80.0–100.0)
Platelets: 379 10*3/uL (ref 150–400)
RBC: 4.24 MIL/uL (ref 3.87–5.11)
RDW: 17.1 % — ABNORMAL HIGH (ref 11.5–15.5)
WBC: 15.5 10*3/uL — ABNORMAL HIGH (ref 4.0–10.5)
nRBC: 0 % (ref 0.0–0.2)

## 2021-05-24 LAB — PHOSPHORUS: Phosphorus: 2.2 mg/dL — ABNORMAL LOW (ref 2.5–4.6)

## 2021-05-24 LAB — PROCALCITONIN: Procalcitonin: <0.1 ng/mL

## 2021-05-24 LAB — GLUCOSE, CAPILLARY: Glucose-Capillary: 171 mg/dL — ABNORMAL HIGH (ref 70–99)

## 2021-05-24 LAB — MAGNESIUM: Magnesium: 1.9 mg/dL (ref 1.7–2.4)

## 2021-05-24 MED ORDER — KETOROLAC TROMETHAMINE 15 MG/ML IJ SOLN
15.0000 mg | Freq: Three times a day (TID) | INTRAMUSCULAR | Status: DC | PRN
  Administered 2021-05-24 – 2021-05-26 (×5): 15 mg via INTRAVENOUS
  Filled 2021-05-24 (×5): qty 1

## 2021-05-24 MED ORDER — K PHOS MONO-SOD PHOS DI & MONO 155-852-130 MG PO TABS
500.0000 mg | ORAL_TABLET | Freq: Once | ORAL | Status: DC
  Filled 2021-05-24: qty 2

## 2021-05-24 MED ORDER — AMLODIPINE BESYLATE 10 MG PO TABS
10.0000 mg | ORAL_TABLET | Freq: Every day | ORAL | Status: DC
  Administered 2021-05-24 – 2021-05-26 (×3): 10 mg via ORAL
  Filled 2021-05-24: qty 2
  Filled 2021-05-24 (×2): qty 1

## 2021-05-24 MED ORDER — METHYLPREDNISOLONE SODIUM SUCC 40 MG IJ SOLR
40.0000 mg | Freq: Two times a day (BID) | INTRAMUSCULAR | Status: DC
  Administered 2021-05-24 – 2021-05-25 (×4): 40 mg via INTRAVENOUS
  Filled 2021-05-24 (×4): qty 1

## 2021-05-24 MED ORDER — CLONIDINE HCL 0.2 MG PO TABS
0.2000 mg | ORAL_TABLET | Freq: Two times a day (BID) | ORAL | Status: DC
  Administered 2021-05-24 – 2021-05-26 (×4): 0.2 mg via ORAL
  Filled 2021-05-24 (×4): qty 1

## 2021-05-24 MED ORDER — ALUM & MAG HYDROXIDE-SIMETH 200-200-20 MG/5ML PO SUSP
30.0000 mL | ORAL | Status: DC | PRN
  Administered 2021-05-24 (×2): 30 mL via ORAL
  Filled 2021-05-24 (×2): qty 30

## 2021-05-24 NOTE — ED Notes (Signed)
Pt demanding to speak to MD about when she will get a bed upstairs.  Pt informed that we have no way of knowing when she will be getting a bed upstairs and that we will keep her informed as much as possible.  Pt continues to talk down to staff and then states "they will have an answer for me falling in that bathroom, I can tell you that".

## 2021-05-24 NOTE — Plan of Care (Signed)

## 2021-05-24 NOTE — Evaluation (Signed)
Physical Therapy Evaluation and Discharged Patient Details Name: April [name] MRN: [id number] DOB: [birth year] Today's Date: 05/24/2021  History of Present Illness  Pt is a 51 y/o female admitted secondary to falls and asthma exacerbation. Pt also with fall during admission. PMH includes HTN, a fib, COPD/asthma.  Clinical Impression  Pt admitted secondary to problem above with deficits below. Pt overall at a mod I to independent level for mobility tasks this session. No LOB noted. Was able to bend over to put shoes on in standing without LOB. Very upset about falling in ED and not being able to shower; RN notified. Pt currently with no further skilled PT needs at this time. Will sign off. If needs change, please re-consult.      Recommendations for follow up therapy are one component of a multi-disciplinary discharge planning process, led by the attending physician.  Recommendations may be updated based on patient status, additional functional criteria and insurance authorization.  Follow Up Recommendations No PT follow up    Assistance Recommended at Discharge PRN  Patient can return home with the following       Equipment Recommendations None recommended by PT  Recommendations for Other Services       Functional Status Assessment Patient has had a recent decline in their functional status and demonstrates the ability to make significant improvements in function in a reasonable and predictable amount of time.     Precautions / Restrictions Precautions Precautions: Fall Precaution Comments: Had two falls prior to admission and one in the bathroom in ED. Restrictions Weight Bearing Restrictions: No      Mobility  Bed Mobility Overal bed mobility: Independent                  Transfers Overall transfer level: Independent                      Ambulation/Gait Ambulation/Gait assistance: Modified independent (Device/Increase time) Gait Distance (Feet): 200  Feet Assistive device: None Gait Pattern/deviations: Step-through pattern, Decreased stride length, Wide base of support Gait velocity: Decreased     General Gait Details: Slower gait speed, but overall steady. No LOB noted. Mild SOB noted.  Stairs            Wheelchair Mobility    Modified Rankin (Stroke Patients Only)       Balance Overall balance assessment: Mild deficits observed, not formally tested                                           Pertinent Vitals/Pain Pain Assessment Pain Assessment: No/denies pain    Home Living Family/patient expects to be discharged to:: Private residence Living Arrangements: Alone Available Help at Discharge: Friend(s) Type of Home: Apartment Home Access: Level entry       Home Layout: One level Home Equipment: None      Prior Function Prior Level of Function : Independent/Modified Independent                     Hand Dominance        Extremity/Trunk Assessment   Upper Extremity Assessment Upper Extremity Assessment: Overall WFL for tasks assessed    Lower Extremity Assessment Lower Extremity Assessment: Overall WFL for tasks assessed    Cervical / Trunk Assessment Cervical / Trunk Assessment: Normal  Communication   Communication: No difficulties  Cognition Arousal/Alertness: Awake/alert Behavior During Therapy: WFL for tasks assessed/performed Overall Cognitive Status: Within Functional Limits for tasks assessed                                          General Comments General comments (skin integrity, edema, etc.): Pt very upset about her fall in the ED and wanting a shower. Notified RN    Exercises     Assessment/Plan    PT Assessment Patient does not need any further PT services  PT Problem List         PT Treatment Interventions      PT Goals (Current goals can be found in the Care Plan section)  Acute Rehab PT Goals Patient Stated Goal: none  stated PT Goal Formulation: With patient Time For Goal Achievement: 05/24/21 Potential to Achieve Goals: Good    Frequency       Co-evaluation               AM-PAC PT "6 Clicks" Mobility  Outcome Measure Help needed turning from your back to your side while in a flat bed without using bedrails?: None Help needed moving from lying on your back to sitting on the side of a flat bed without using bedrails?: None Help needed moving to and from a bed to a chair (including a wheelchair)?: None Help needed standing up from a chair using your arms (e.g., wheelchair or bedside chair)?: None Help needed to walk in hospital room?: None Help needed climbing 3-5 steps with a railing? : A Lot 6 Click Score: 22    End of Session   Activity Tolerance: Patient tolerated treatment well Patient left: in bed;with call bell/phone within reach Nurse Communication: Mobility status PT Visit Diagnosis: Other abnormalities of gait and mobility (R26.89)    Time: 7943-2761 PT Time Calculation (min) (ACUTE ONLY): 16 min   Charges:   PT Evaluation $PT Eval Low Complexity: 1 Low          April [name], DPT  Acute Rehabilitation Services  Pager: [phone] Office: [phone]   April [name] 05/24/2021, 1:55 PM

## 2021-05-24 NOTE — ED Notes (Signed)
While exiting another pt room pt was ambulating independently from bathroom back to room. Pt informed of importance to call out for assistance to ambulate to prevent any future falls.

## 2021-05-24 NOTE — Progress Notes (Signed)
Pt refusing CPAP for tonight. Advised pt to notify for RT if she decides she wants CPAP set up while admitted.

## 2021-05-24 NOTE — ED Notes (Signed)
Pt requested list of medication, which was provided for pt. Pt also requests written report of her fall that was caused by "water on the floor". Pt informed this RN did not notice any water on the floor. Unable to provided that in written documentation. Pt informed she could request documentation of fall from medical records.   Pt reinforced to not get up without assistance to prevent another fall. Pt told to call out and not ambulate to nurses stations without assistance.

## 2021-05-24 NOTE — ED Notes (Signed)
Pt called out stating her back is hurting unbearably. Pt requesting x-ray. Was given PRN Percocet by pervious RN following fall. Secure chat sent to [facility] Dr. Tonie Griffith. Awaiting response

## 2021-05-24 NOTE — ED Notes (Signed)
Pt transported to XR.  

## 2021-05-24 NOTE — Progress Notes (Signed)
PROGRESS NOTE  April [name]  DOB: 1971-01-14  PCP: [name], MD YQM:[id number]  DOA: 05/22/2021  LOS: 0 days  Hospital Day: 3  Chief Complaint  Patient presents with   Shortness of Breath   Fall   Brief narrative: April [name] is a 51 y.o. female with PMH significant for morbid obesity, OSA noncompliant to CPAP, prediabetes, HTN, asthma, COPD, paroxysmal A. fib with noncompliance to Eliquis, chronic diastolic CHF, bilateral lower extremity edema, rheumatoid arthritis, anxiety/depression. Patient presented to the ED by EMS on 1/24 for shortness of breath generalized weakness, chest pain for 3 days, 2 episodes of falls.  She believes she is having an episode of asthma exacerbation that did not improve with bronchodilators at home. Chest pain, EMS gave her 1 dose of aspirin 324 mg, 1 dose of sublingual nitroglycerin.   In the ED, patient was afebrile, heart rate 101, respiratory 22, blood pressure 144/100, breathing on room air with O2 sat at 100%. Labs with sodium 148, potassium 3.3, BUN/creatinine 5/0.7, WC count elevated to 13.9, platelet 412 Respiratory virus panel negative Quantitative beta-hCG negative CT angio chest did not show any evidence of pulm embolism.  But showed mild bilateral centrilobular and paraseptal bullous emphysema.   Patient was admitted to hospital service for further evaluation management  Subjective: Patient was seen and examined this morning. Lying down in bed.  Not on supplemental oxygen.  Less wheezing than yesterday.  Family at bedside. Event from last night noted.  Patient had a fall in the bathroom.  She says she slipped on water.  Per nursing note, there is no water on the bathroom floor at the time.  Assessment/Plan: Acute exacerbation of COPD/asthma -Presented with progressive, chest pain, shortness of breath -Troponin negative.  Chest imaging negative for acute infiltrates. -May have viral exacerbation of COPD/asthma.  On admission, patient  had audible wheezing on examination and coughs on deep breathing.  WBC count elevated to 13.9.  Procalcitonin level normal.-No fever.  No evidence of bacterial infection at this time.  She is being monitored off antibiotics. -This morning, wheezing is better on Solu-Medrol IV, bronchodilators, incentive spirometry.  We will continue the same plan for today. -Patient follows up with pulmonologist Dr. Vaughan Browner as an outpatient. -On chart review, I noted that on 1/12, patient received a dose of days Tezepelumab for asthma Recent Labs  Lab 05/22/21 1727 05/24/21 0156  WBC 13.9* 15.5*  PROCALCITON  --  <0.10   Obstructive sleep apnea -Noncompliant to CPAP.  Tried again last night but did not like it.  Encouraged compliance.   Frequent falls -Patient mentions that in the morning hours, she gets drowsy and tends to fall.  I wonder if she is having these because of untreated CPAP.  Patient however states that she slipped on the rug at home before she fell.  Pending PT eval.   Acute hypernatremia -Sodium level was elevated to 148 in the ED.  Unclear reason.  Patient has good oral intake and hydration.  Improved sodium level with D5 half NS.   Recent Labs  Lab 05/22/21 1727 05/24/21 0156  NA 148* 140   Prediabetes with hyperglycemia -A1c 5.9 -Patient seems to be on semaglutide at home.   -Blood sugar running elevated this morning because of steroids.  Continue sliding scale insulin with Accu-Cheks. Recent Labs  Lab 05/23/21 2200 05/24/21 0842  GLUCAP 226* 233*    Chronic diastolic CHF Essential hypertension -Has mild bilateral lower extremity edema -Pending echocardiogram.  Paroxysmal A. Fib -I do not see any AV nodal blocking agent in her list of medicine   Rheumatoid arthritis -CT chest on admission howed progressive changes of discogenic sclerosis involving the C6, C7 and T1 vertebrae.   Anxiety/depression -Klonopin, Ambien at bedtime   Morbid obesity  -There is no height or  weight on file to calculate BMI. Patient has been advised to make an attempt to improve diet and exercise patterns to aid in weight loss.   OSA -Nightly CPAP  Mobility: Encourage ambulation Living condition: Lives at home Goals of care:   Code Status: Full Code  Nutritional status: Body mass index is 53.37 kg/m.      Diet:  Diet Order             Diet regular Room service appropriate? Yes; Fluid consistency: Thin  Diet effective now                  DVT prophylaxis:   apixaban (ELIQUIS) tablet 5 mg   Antimicrobials: None Fluid: No need to continue D5 half NS today Consultants: None Family Communication: Family at bedside  Status is: Observation  Continue in-hospital care because: Continues to need steroids for wheezing, pending echocardiogram Level of care: Med-Surg   Dispo: The patient is from: Home              Anticipated d/c is to: Home              Patient currently is not medically stable to d/c.   Difficult to place patient No     Infusions:   dextrose 5 % and 0.45% NaCl Stopped (05/24/21 0406)    Scheduled Meds:  albuterol  2.5 mg Nebulization Q6H   apixaban  5 mg Oral BID   atorvastatin  20 mg Oral q morning   azithromycin  500 mg Oral Daily   benzonatate  100 mg Oral BID   budesonide (PULMICORT) nebulizer solution  2 mg Nebulization Q12H   carvedilol  6.25 mg Oral BID WC   DULoxetine  60 mg Oral Daily   fluticasone furoate-vilanterol  1 puff Inhalation Daily   And   umeclidinium bromide  1 puff Inhalation Daily   furosemide  40 mg Oral Daily   insulin aspart  0-5 Units Subcutaneous QHS   insulin aspart  0-9 Units Subcutaneous TID WC   levothyroxine  50 mcg Oral Daily   methylPREDNISolone (SOLU-MEDROL) injection  40 mg Intravenous Q12H   montelukast  10 mg Oral QHS   pantoprazole  40 mg Oral Daily   phosphorus  500 mg Oral Once   pregabalin  300 mg Oral BID   topiramate  200 mg Oral QHS   [START ON 05/28/2021] Vitamin D (Ergocalciferol)   50,000 Units Oral Q Mon    PRN meds: acetaminophen **OR** acetaminophen, alum & mag hydroxide-simeth, hydrALAZINE, oxyCODONE-acetaminophen, zolpidem   Antimicrobials: Anti-infectives (From admission, onward)    Start     Dose/Rate Route Frequency Ordered Stop   05/24/21 1745  azithromycin (ZITHROMAX) tablet 500 mg       See Hyperspace for full Linked Orders Report.   500 mg Oral Daily 05/23/21 1744 05/28/21 0959   05/23/21 1744  azithromycin (ZITHROMAX) 500 mg in sodium chloride 0.9 % 250 mL IVPB       See Hyperspace for full Linked Orders Report.   500 mg 250 mL/hr over 60 Minutes Intravenous Every 24 hours 05/23/21 1744 05/23/21 2058       Objective:  Vitals:   05/24/21 0609 05/24/21 0800  BP: (!) 187/96 (!) 184/107  Pulse: 89 (!) 104  Resp: 20 (!) 24  Temp:    SpO2: 100% 97%   No intake or output data in the 24 hours ending 05/24/21 1059 Filed Weights   05/23/21 2001  Weight: (!) 150 kg   Weight change:  Body mass index is 53.37 kg/m.   Physical Exam: General exam: Middle-aged African-American female with morbid obesity Skin: No rashes, lesions or ulcers. HEENT: Atraumatic, normocephalic, no obvious bleeding Lungs: Scattered bilateral wheezing, better than yesterday.  Still coughs on deep breathing CVS: Regular rate and rhythm, no murmur GI/Abd soft, nontender, nondistended, bowel sound present CNS: Alert, awake, oriented x3 Psychiatry: Mood appropriate Extremities: Trace bilateral pedal edema, no calf tenderness  Data Review: I have personally reviewed the laboratory data and studies available.  F/u labs ordered Unresulted Labs (From admission, onward)     Start     Ordered   05/24/21 7371  Basic metabolic panel  Daily,   R      05/23/21 1744   05/24/21 0500  CBC  Daily,   R      05/23/21 1744   05/23/21 1745  Hemoglobin A1c  Add-on,   AD       Comments: To assess prior glycemic control    05/23/21 1744            Signed, Terrilee Croak,  MD [facility] 05/24/2021

## 2021-05-24 NOTE — Progress Notes (Signed)
Notified by RN that pt had fall last pm that was reported by previous Therapist, sports.  Pt complaining of low back pain now and wants an xray.    Lumbar xray ordered

## 2021-05-24 NOTE — Telephone Encounter (Signed)
Home Health Verbal Orders - Caller/Agency:patient,Kailani Schar Callback Number:[phone] Requesting /Skilled Nursing Frequency:not sure

## 2021-05-24 NOTE — ED Notes (Signed)
This RN to room to provide AM meds.  Pt requesting copy of her xrays.  Informed pt that she would have to go through medical records to obtain those copies.  Pt again asking about bed upstairs.  Pt informed that if a bed became available this afternoon, she certainly was in line to get a bed.  Pt became irate with this RN stating she has been here for 5 days and has not had a shower.  Pt states to this RN that "that is neglectful".  This RN attempted to explain to pt that all of this was out of her control and that she had only been in the ED for just under 2 days.  Pt then refused to continue talking with this RN and stated that she wanted to speak with a supervisor.  Charge RN made aware and will make rounds when available.

## 2021-05-24 NOTE — Progress Notes (Signed)
Echo attempted. Unable to get machine in room in ED at this time. Will attempt again later as schedule permits.

## 2021-05-24 NOTE — ED Notes (Signed)
Pt showered, changed gown and new linings placed on bed.

## 2021-05-24 NOTE — ED Notes (Signed)
RT informed pt requesting breathing treatment

## 2021-05-24 NOTE — ED Notes (Addendum)
This RN heard pt yelling out for help. Pt found on bathroom floor. She states she slipped and fell on something wet landing on her buttocks. Son who was in room at the time, responded to pts call out for help, and was found at pt's side when this RN responded. No water noted on floor. Pt instructed to stay on the floor while this RN stepped away to find assistance to get her up. Primary RN Windle Guard was in another pts room. Conner RN assisted pt to get up. Pt c/o lower back pain. Was able to stand up with assistance from both RNs and assisted to bathroom and back to room. Denies head injury. Denies dizziness. Pt confirmed that she slipped and fell. Pt informed to call for further assistance ambulating to the bathroom. Son at bedside also informed on importance of asking for assistance.

## 2021-05-24 NOTE — Telephone Encounter (Signed)
Okay to give verbal orders?  ?

## 2021-05-24 NOTE — ED Notes (Signed)
Pt ambulated to bathroom on steady gait, requested pain medicine and complained of back pain

## 2021-05-24 NOTE — Progress Notes (Signed)
New admission to room 5N14. Patient is A&O*4. Able to verbalize her needs to staff. VS checked. CO pain 10/10 to back,PRN medication administered. BP 191/99, PRN med administered. Explained to the patient the importance of calling the staff for assistance but patient states she has her family in the room who will help. This nurse asked the patient to call staff for assistance when needed. Bed kept in low position and locked. Call bell in reach. Floor mat placed. Bed alarm in place. Patient requested to cut off the bed alarm but this nurse educated the patient on importance of bedalarm at this time. Will continue to monitor.

## 2021-05-24 NOTE — ED Notes (Signed)
Pt again ambulatory to BR without assistance.  Pt removed breathing tx and monitors.

## 2021-05-25 ENCOUNTER — Inpatient Hospital Stay (HOSPITAL_COMMUNITY): Payer: Medicare Other

## 2021-05-25 DIAGNOSIS — R9431 Abnormal electrocardiogram [ECG] [EKG]: Secondary | ICD-10-CM

## 2021-05-25 LAB — ECHOCARDIOGRAM COMPLETE
AR max vel: 3.36 cm2
AV Area VTI: 4.38 cm2
AV Area mean vel: 4.1 cm2
AV Mean grad: 6 mmHg
AV Peak grad: 12.8 mmHg
Ao pk vel: 1.79 m/s
Height: 66 in
MV VTI: 6.68 cm2
S' Lateral: 3.1 cm
Weight: 5291.04 oz

## 2021-05-25 LAB — CBC
HCT: 34.2 % — ABNORMAL LOW (ref 36.0–46.0)
Hemoglobin: 10.3 g/dL — ABNORMAL LOW (ref 12.0–15.0)
MCH: 24.9 pg — ABNORMAL LOW (ref 26.0–34.0)
MCHC: 30.1 g/dL (ref 30.0–36.0)
MCV: 82.8 fL (ref 80.0–100.0)
Platelets: 410 10*3/uL — ABNORMAL HIGH (ref 150–400)
RBC: 4.13 MIL/uL (ref 3.87–5.11)
RDW: 17.3 % — ABNORMAL HIGH (ref 11.5–15.5)
WBC: 22.2 10*3/uL — ABNORMAL HIGH (ref 4.0–10.5)
nRBC: 0 % (ref 0.0–0.2)

## 2021-05-25 LAB — BASIC METABOLIC PANEL
Anion gap: 5 (ref 5–15)
BUN: 15 mg/dL (ref 6–20)
CO2: 24 mmol/L (ref 22–32)
Calcium: 8.7 mg/dL — ABNORMAL LOW (ref 8.9–10.3)
Chloride: 107 mmol/L (ref 98–111)
Creatinine, Ser: 0.9 mg/dL (ref 0.44–1.00)
GFR, Estimated: >60 mL/min (ref >60)
Glucose, Bld: 170 mg/dL — ABNORMAL HIGH (ref 70–99)
Potassium: 4.3 mmol/L (ref 3.5–5.1)
Sodium: 136 mmol/L (ref 135–145)

## 2021-05-25 LAB — GLUCOSE, CAPILLARY
Glucose-Capillary: 181 mg/dL — ABNORMAL HIGH (ref 70–99)
Glucose-Capillary: 178 mg/dL — ABNORMAL HIGH (ref 70–99)
Glucose-Capillary: 196 mg/dL — ABNORMAL HIGH (ref 70–99)
Glucose-Capillary: 184 mg/dL — ABNORMAL HIGH (ref 70–99)

## 2021-05-25 MED ORDER — ALBUTEROL SULFATE (2.5 MG/3ML) 0.083% IN NEBU
2.5000 mg | INHALATION_SOLUTION | RESPIRATORY_TRACT | Status: DC | PRN
  Administered 2021-05-26: 2.5 mg via RESPIRATORY_TRACT
  Filled 2021-05-25: qty 3

## 2021-05-25 MED ORDER — ALBUTEROL SULFATE (2.5 MG/3ML) 0.083% IN NEBU
2.5000 mg | INHALATION_SOLUTION | Freq: Three times a day (TID) | RESPIRATORY_TRACT | Status: DC
  Administered 2021-05-25 – 2021-05-26 (×3): 2.5 mg via RESPIRATORY_TRACT
  Filled 2021-05-25 (×3): qty 3

## 2021-05-25 MED ORDER — PERFLUTREN LIPID MICROSPHERE
1.0000 mL | INTRAVENOUS | Status: AC | PRN
  Administered 2021-05-25: 3 mL via INTRAVENOUS
  Filled 2021-05-25: qty 10

## 2021-05-25 NOTE — Telephone Encounter (Signed)
Home health agency did not call and request verbal orders. Patient requesting home health and/or PCS services.   She is currently admitted in the hospital for asthma exacerbation per patient. States she had a fall in the hospital in the bathroom last night. She was advised to reach out to her nurse and request to speak with the Case manager or Social Worker to set up a discharge plan, requesting the PCS or HH.    Patient verbalized understanding.

## 2021-05-25 NOTE — Progress Notes (Signed)
Pt refused CPAP for tonight. Pt does not wear CPAP at home.

## 2021-05-25 NOTE — Progress Notes (Signed)
PROGRESS NOTE  April [name]  DOB: 1970-11-30  PCP: [name], MD HUD:[id number]  DOA: 05/22/2021  LOS: 1 day  Hospital Day: 4  Chief Complaint  Patient presents with   Shortness of Breath   Fall   Brief narrative: April [name] is a 51 y.o. female with PMH significant for morbid obesity, OSA noncompliant to CPAP, prediabetes, HTN, asthma, COPD, paroxysmal A. fib with noncompliance to Eliquis, chronic diastolic CHF, bilateral lower extremity edema, rheumatoid arthritis, anxiety/depression. Patient presented to the ED by EMS on 1/24 for shortness of breath generalized weakness, chest pain for 3 days, 2 episodes of falls.  She believes she is having an episode of asthma exacerbation that did not improve with bronchodilators at home. Chest pain, EMS gave her 1 dose of aspirin 324 mg, 1 dose of sublingual nitroglycerin.   In the ED, patient was afebrile, heart rate 101, respiratory 22, blood pressure 144/100, breathing on room air with O2 sat at 100%. Labs with sodium 148, potassium 3.3, BUN/creatinine 5/0.7, WC count elevated to 13.9, platelet 412 Respiratory virus panel negative Quantitative beta-hCG negative CT angio chest did not show any evidence of pulm embolism.  But showed mild bilateral centrilobular and paraseptal bullous emphysema.   Patient was admitted to hospital service for further evaluation management  Subjective: Patient was seen and examined this morning. Lying on bed.  Wheezing.  On 5 L oxygen by nasal cannula. Did not tolerate CPAP last night.  Assessment/Plan: Acute exacerbation of COPD/asthma Acute hypoxic respiratory failure -Presented with progressive, chest pain, shortness of breath -Troponin negative.  Chest imaging negative for acute infiltrates. -May have viral exacerbation of COPD/asthma.  On admission, patient had audible wheezing on examination and coughs on deep breathing.  WBC count elevated to 13.9.  Procalcitonin level normal.-No fever.  No  evidence of bacterial infection at this time.  She is being monitored off antibiotics. -Continues to have wheezing.  Continue same dose of Solu-Medrol IV today.  Continue, bronchodilators, incentive spirometry.   -Patient follows up with pulmonologist Dr. Vaughan Browner as an outpatient. -On chart review, I noted that on 1/12, patient received a dose of days Tezepelumab for asthma -On 5 L oxygen in the clinic this morning.  Wean down as tolerated.  Obtain ambulatory oxygen requirement Recent Labs  Lab 05/22/21 1727 05/24/21 0156 05/25/21 0113  WBC 13.9* 15.5* 22.2*  PROCALCITON  --  <0.10  --     Obstructive sleep apnea -Noncompliant to CPAP.  Tried again last night but did not like it.  Encouraged compliance.   Frequent falls -Patient mentions that in the morning hours, she gets drowsy and tends to fall.  I wonder if she is having these because of untreated CPAP.  Patient however states that she slipped on the rug at home before she fell.  Pending PT eval.   Acute hypernatremia -Sodium level was elevated to 148 in the ED.  Unclear reason.  Patient has good oral intake and hydration.  Improved sodium level with D5 half NS.   Recent Labs  Lab 05/22/21 1727 05/24/21 0156 05/25/21 0113  NA 148* 140 136    Prediabetes with hyperglycemia -A1c 5.9 -Patient seems to be on semaglutide at home.   -Blood sugar running elevated this morning because of steroids.  Continue sliding scale insulin with Accu-Cheks. Recent Labs  Lab 05/24/21 0842 05/24/21 1340 05/24/21 1823 05/25/21 0537 05/25/21 1211  GLUCAP 233* 156* 171* 181* 178*     Chronic diastolic CHF Essential hypertension -Has  mild bilateral lower extremity edema -Pending echocardiogram.   Paroxysmal A. Fib -I do not see any AV nodal blocking agent in her list of medicine   Rheumatoid arthritis -CT chest on admission howed progressive changes of discogenic sclerosis involving the C6, C7 and T1 vertebrae.    Anxiety/depression -Klonopin, Ambien at bedtime   Morbid obesity  -There is no height or weight on file to calculate BMI. Patient has been advised to make an attempt to improve diet and exercise patterns to aid in weight loss.   OSA -Nightly CPAP  Mobility: Encourage ambulation Living condition: Lives at home Goals of care:   Code Status: Full Code  Nutritional status: Body mass index is 53.37 kg/m.      Diet:  Diet Order             Diet regular Room service appropriate? Yes; Fluid consistency: Thin  Diet effective now                  DVT prophylaxis:   apixaban (ELIQUIS) tablet 5 mg   Antimicrobials: None Fluid: Not on IV fluid Consultants: None Family Communication: Family at bedside  Status is: Inpatient  Continue in-hospital care because: Continues to need steroids for wheezing Level of care: Med-Surg   Dispo: The patient is from: Home              Anticipated d/c is to: Home              Patient currently is not medically stable to d/c.   Difficult to place patient No     Infusions:   dextrose 5 % and 0.45% NaCl Stopped (05/24/21 0406)    Scheduled Meds:  albuterol  2.5 mg Nebulization TID   amLODipine  10 mg Oral Daily   apixaban  5 mg Oral BID   atorvastatin  20 mg Oral q morning   azithromycin  500 mg Oral Daily   benzonatate  100 mg Oral BID   carvedilol  6.25 mg Oral BID WC   cloNIDine  0.2 mg Oral BID   DULoxetine  60 mg Oral Daily   fluticasone furoate-vilanterol  1 puff Inhalation Daily   And   umeclidinium bromide  1 puff Inhalation Daily   furosemide  40 mg Oral Daily   insulin aspart  0-5 Units Subcutaneous QHS   insulin aspart  0-9 Units Subcutaneous TID WC   levothyroxine  50 mcg Oral Daily   methylPREDNISolone (SOLU-MEDROL) injection  40 mg Intravenous Q12H   montelukast  10 mg Oral QHS   pantoprazole  40 mg Oral Daily   phosphorus  500 mg Oral Once   pregabalin  300 mg Oral BID   topiramate  200 mg Oral QHS   [START  ON 05/28/2021] Vitamin D (Ergocalciferol)  50,000 Units Oral Q Mon    PRN meds: acetaminophen **OR** acetaminophen, albuterol, alum & mag hydroxide-simeth, hydrALAZINE, ketorolac, oxyCODONE-acetaminophen, zolpidem   Antimicrobials: Anti-infectives (From admission, onward)    Start     Dose/Rate Route Frequency Ordered Stop   05/24/21 1745  azithromycin (ZITHROMAX) tablet 500 mg       See Hyperspace for full Linked Orders Report.   500 mg Oral Daily 05/23/21 1744 05/28/21 0959   05/23/21 1744  azithromycin (ZITHROMAX) 500 mg in sodium chloride 0.9 % 250 mL IVPB       See Hyperspace for full Linked Orders Report.   500 mg 250 mL/hr over 60 Minutes Intravenous Every 24 hours 05/23/21  1744 05/23/21 2058       Objective: Vitals:   05/25/21 0747 05/25/21 1210  BP: 125/76 126/69  Pulse: 85 84  Resp: 18 18  Temp: 97.9 F (36.6 C) 97.7 F (36.5 C)  SpO2: 100% 100%    Intake/Output Summary (Last 24 hours) at 05/25/2021 1421 Last data filed at 05/25/2021 0300 Gross per 24 hour  Intake 78.94 ml  Output --  Net 78.94 ml   Filed Weights   05/23/21 2001  Weight: (!) 150 kg   Weight change:  Body mass index is 53.37 kg/m.   Physical Exam: General exam: Middle-aged African-American female with morbid obesity Skin: No rashes, lesions or ulcers. HEENT: Atraumatic, normocephalic, no obvious bleeding Lungs: Continues to have scattered bilateral wheezing.  Continues to have significant cough on deep breathing CVS: Regular rate and rhythm, no murmur GI/Abd soft, nontender, nondistended, bowel sound present CNS: Alert, awake, oriented x3 Psychiatry: Mood appropriate Extremities: Trace bilateral pedal edema, no calf tenderness  Data Review: I have personally reviewed the laboratory data and studies available.  F/u labs ordered Unresulted Labs (From admission, onward)     Start     Ordered   05/24/21 6389  Basic metabolic panel  Daily,   R      05/23/21 1744   05/24/21 0500  CBC   Daily,   R      05/23/21 1744            Signed, Terrilee Croak, MD [facility] 05/25/2021

## 2021-05-25 NOTE — Progress Notes (Signed)
Mobility Specialist Criteria Algorithm Info.   05/25/21 1045  Pain Assessment  Pain Assessment 0-10  Pain Score 10  Breathing 0  Negative Vocalization 0  Facial Expression 0  Body Language 0  Consolability 0  PAINAD Score 0  Pain Location Back (upper & lower) (More so on left side)  Pain Descriptors / Indicators Discomfort;Aching  Pain Intervention(s) Limited activity within patient's tolerance  Mobility  Activity Ambulated independently in hallway; Ambulated to bathroom  Range of Motion/Exercises Active;All extremities  Level of Assistance Modified independent, requires aide device or extra time  Assistive Device Front wheel walker  Distance Ambulated (ft) 280 ft  Activity Response Tolerated well   Patient received in bed agreeable to participate in mobility. Prior to ambulation completed education on appropriate usage of supplemental oxygen, energy conservation/RPE, and pursed lip breathing. Patient stated that she's had falls recently including the fall in the ED, but denied any associated dizziness or lightheadedness. Ambulated in hallway mod I requiring standing rest break x1 secondary to fatigue. Was 3/4 dyspneic and wheezing with exertion. Oxygen saturated 100% on RA throughout ambulation. Returned to room without incident. Pt did complain of 10/10 back pain stating its painful at rest and with exertion. Was able to perform all task without significant debility despite pain and SOB.   05/25/2021 11:14 AM  [place] Jovante Hammitt, CMS, Marty  PETKK:[phone] Office: [phone]

## 2021-05-25 NOTE — Plan of Care (Signed)
  Problem: Coping: Goal: Level of anxiety will decrease Outcome: Progressing   Problem: Elimination: Goal: Will not experience complications related to bowel motility Outcome: Progressing   Problem: Pain Managment: Goal: General experience of comfort will improve Outcome: Progressing   Problem: Skin Integrity: Goal: Risk for impaired skin integrity will decrease Outcome: Progressing   

## 2021-05-25 NOTE — Progress Notes (Signed)
SATURATION QUALIFICATIONS: (This note is used to comply with regulatory documentation for home oxygen)  Patient Saturations on Room Air at Rest 100%  Patient Saturations on Room Air while Ambulating = 100%  Patient Saturations on 0 Liters of oxygen while Ambulating = n/a%  Please briefly explain why patient needs home oxygen: 

## 2021-05-26 LAB — CBC
HCT: 37.4 % (ref 36.0–46.0)
Hemoglobin: 11.5 g/dL — ABNORMAL LOW (ref 12.0–15.0)
MCH: 25.7 pg — ABNORMAL LOW (ref 26.0–34.0)
MCHC: 30.7 g/dL (ref 30.0–36.0)
MCV: 83.7 fL (ref 80.0–100.0)
Platelets: 426 10*3/uL — ABNORMAL HIGH (ref 150–400)
RBC: 4.47 MIL/uL (ref 3.87–5.11)
RDW: 17.4 % — ABNORMAL HIGH (ref 11.5–15.5)
WBC: 23.1 10*3/uL — ABNORMAL HIGH (ref 4.0–10.5)
nRBC: 0 % (ref 0.0–0.2)

## 2021-05-26 LAB — BASIC METABOLIC PANEL
Anion gap: 10 (ref 5–15)
BUN: 17 mg/dL (ref 6–20)
CO2: 22 mmol/L (ref 22–32)
Calcium: 8.8 mg/dL — ABNORMAL LOW (ref 8.9–10.3)
Chloride: 105 mmol/L (ref 98–111)
Creatinine, Ser: 1.02 mg/dL — ABNORMAL HIGH (ref 0.44–1.00)
GFR, Estimated: >60 mL/min (ref >60)
Glucose, Bld: 131 mg/dL — ABNORMAL HIGH (ref 70–99)
Potassium: 3.6 mmol/L (ref 3.5–5.1)
Sodium: 137 mmol/L (ref 135–145)

## 2021-05-26 LAB — GLUCOSE, CAPILLARY
Glucose-Capillary: 93 mg/dL (ref 70–99)
Glucose-Capillary: 110 mg/dL — ABNORMAL HIGH (ref 70–99)

## 2021-05-26 MED ORDER — PREDNISONE 10 MG PO TABS: ORAL_TABLET | ORAL | 0 refills | Status: DC

## 2021-05-26 MED ORDER — OXYCODONE-ACETAMINOPHEN 10-325 MG PO TABS: 1.0000 | ORAL_TABLET | Freq: Three times a day (TID) | ORAL | 0 refills | Status: AC | PRN

## 2021-05-26 MED ORDER — CLONIDINE HCL 0.2 MG PO TABS: 0.2000 mg | ORAL_TABLET | Freq: Two times a day (BID) | ORAL | 2 refills | Status: DC

## 2021-05-26 NOTE — Plan of Care (Signed)

## 2021-05-26 NOTE — Discharge Summary (Signed)
Physician Discharge Summary  April [name]:[id number] DOB: July 20, 1970 DOA: 05/22/2021  PCP: [name], MD  Admit date: 05/22/2021 Discharge date: 05/26/2021  Admitted From: Home Discharge disposition: Home   Code Status: Full Code   Discharge Diagnosis:   Active Problems:   Asthma exacerbation    Chief Complaint  Patient presents with   Shortness of Breath   Fall   Brief narrative: April [name] is a 51 y.o. female with PMH significant for morbid obesity, OSA noncompliant to CPAP, prediabetes, HTN, asthma, COPD, paroxysmal A. fib with noncompliance to Eliquis, chronic diastolic CHF, bilateral lower extremity edema, rheumatoid arthritis, anxiety/depression. Patient presented to the ED by EMS on 1/24 for shortness of breath generalized weakness, chest pain for 3 days, 2 episodes of falls.  She believes she is having an episode of asthma exacerbation that did not improve with bronchodilators at home. Chest pain, EMS gave her 1 dose of aspirin 324 mg, 1 dose of sublingual nitroglycerin.   In the ED, patient was afebrile, heart rate 101, respiratory 22, blood pressure 144/100, breathing on room air with O2 sat at 100%. Labs with sodium 148, potassium 3.3, BUN/creatinine 5/0.7, WC count elevated to 13.9, platelet 412 Respiratory virus panel negative Quantitative beta-hCG negative CT angio chest did not show any evidence of pulm embolism.  But showed mild bilateral centrilobular and paraseptal bullous emphysema.   Patient was admitted to hospital service for further evaluation management  Subjective: Patient was seen and examined this morning. Lying down in bed.  No wheezing today.  Not on supplemental oxygen.   Assessment/Plan: Acute exacerbation of COPD/asthma Acute hypoxic respiratory failure -Presented with progressive, chest pain, shortness of breath -Troponin negative.  Chest imaging negative for acute infiltrates. -May have had viral exacerbation of COPD/asthma.  On  admission, patient had audible wheezing on examination and coughs on deep breathing.  WBC count elevated to 13.9.  Procalcitonin level normal.-No fever.  No evidence of bacterial infection at this time.   -She had significant wheezing on admission.  Improving now on IV Solu-Medrol.  Very minimal wheezing on examination today.  Discharged on a course of oral steroids.  Continue bronchodilators and incentive spirometry at home. -Patient follows up with pulmonologist Dr. Vaughan Browner as an outpatient. -On chart review, I noted that on 1/12, patient received a dose of days Tezepelumab for asthma -Currently not requiring supplemental oxygen.  Patient states he has oxygen at home if she needs.  Obstructive sleep apnea -Noncompliant to CPAP.  Extensively counseled about significant risk of morbidity and mortality associated with untreated sleep apnea.  Encouraged compliance.   Frequent falls -Patient mentions that in the morning hours, she gets drowsy and tends to fall.  I wonder if she is having these because of untreated CPAP.  Patient however states that she slipped on the rug at home before she fell.  PT eval obtained.  No follow-up required.  Acute hypernatremia -Sodium level was elevated to 148 in the ED.  Unclear reason.  Patient has good oral intake and hydration.  Improved sodium level with D5 half NS.  Has remained normal last 3 readings.   Recent Labs  Lab 05/22/21 1727 05/24/21 0156 05/25/21 0113 05/26/21 0224  NA 148* 140 136 137   Prediabetes with hyperglycemia -A1c 5.9 -Patient seems to be on semaglutide at home.   -Blood sugar running elevated transiently because of steroids.   Recent Labs  Lab 05/25/21 0537 05/25/21 1211 05/25/21 1624 05/25/21 2045 05/26/21 0756  GLUCAP 181* 178* 196* 184* 93    Chronic diastolic CHF Essential hypertension -Has mild bilateral lower extremity edema -Echocardiogram LVEF 65 to 70%, moderate concentric LVH, RV size and function normal.    Paroxysmal A. Fib -I do not see any AV nodal blocking agent in her list of medicine   Rheumatoid arthritis -CT chest on admission howed progressive changes of discogenic sclerosis involving the C6, C7 and T1 vertebrae.   Anxiety/depression -Klonopin, Ambien at bedtime   Morbid obesity  -Patient has been advised to make an attempt to improve diet and exercise patterns to aid in weight loss.   OSA -Nightly CPAP  Mobility: Encourage ambulation Living condition: Lives at home Goals of care:   Code Status: Full Code  Nutritional status: Body mass index is 53.37 kg/m.      Discharge Medications:   Allergies as of 05/26/2021       Reactions   Contrast Media [iodinated Contrast Media] Itching   Ct contrast        Medication List     STOP taking these medications    azithromycin 250 MG tablet Commonly known as: ZITHROMAX   clonazePAM 1 MG tablet Commonly known as: KLONOPIN       TAKE these medications    albuterol 108 (90 Base) MCG/ACT inhaler Commonly known as: ProAir HFA INHALE 2 PUFFS EVERY 6 HOURS AS NEEDED What changed:  how much to take how to take this when to take this reasons to take this additional instructions   amLODipine 10 MG tablet Commonly known as: NORVASC Take 1 tablet (10 mg total) by mouth daily.   apixaban 5 MG Tabs tablet Commonly known as: ELIQUIS Take 1 tablet (5 mg total) by mouth 2 (two) times daily. What changed: when to take this   atorvastatin 20 MG tablet Commonly known as: LIPITOR Take 20 mg by mouth every morning.   baclofen 20 MG tablet Commonly known as: LIORESAL Take 20 mg by mouth daily as needed for muscle spasms (pain).   benzonatate 100 MG capsule Commonly known as: TESSALON Take 1 capsule (100 mg total) by mouth every 8 (eight) hours.   carvedilol 6.25 MG tablet Commonly known as: COREG Take 1 tablet (6.25 mg total) by mouth 2 (two) times daily with a meal.   cloNIDine 0.2 MG tablet Commonly known  as: CATAPRES Take 1 tablet (0.2 mg total) by mouth 2 (two) times daily.   DULoxetine 60 MG capsule Commonly known as: CYMBALTA Take 1 capsule (60 mg total) by mouth daily.   EPINEPHrine 0.3 mg/0.3 mL Soaj injection Commonly known as: EPI-PEN Inject 0.3 mg into the muscle as needed for anaphylaxis. What changed:  when to take this reasons to take this   fluticasone 50 MCG/ACT nasal spray Commonly known as: FLONASE Place 1 spray into both nostrils daily. What changed:  when to take this reasons to take this   furosemide 40 MG tablet Commonly known as: LASIX Take 1 tablet (40 mg total) by mouth daily.   ipratropium-albuterol 0.5-2.5 (3) MG/3ML Soln Commonly known as: DUONEB Take 3 mLs by nebulization every 6 (six) hours as needed (shortness of breath/wheezing).   levothyroxine 50 MCG tablet Commonly known as: SYNTHROID Take 50 mcg by mouth daily.   montelukast 10 MG tablet Commonly known as: SINGULAIR TAKE 1 TABLET BY MOUTH AT BEDTIME. OFFICE VISIT NEEDED FOR ADDITIONAL REFILLS What changed: See the new instructions.   naloxone 4 MG/0.1ML Liqd nasal spray kit Commonly known as: Engineer, materials  1 spray into the nose once as needed (opioid overdose).   omeprazole 20 MG capsule Commonly known as: PRILOSEC Take 20 mg by mouth daily as needed (heartburn/indigestion).   oxyCODONE-acetaminophen 10-325 MG tablet Commonly known as: PERCOCET Take 1 tablet by mouth every 8 (eight) hours as needed for up to 3 days for pain. What changed: when to take this   Ozempic (1 MG/DOSE) 4 MG/3ML Sopn Generic drug: Semaglutide (1 MG/DOSE) Inject 1 mg into the skin every Monday.   predniSONE 10 MG tablet Commonly known as: DELTASONE Take 4 tablets daily X 2 days, then, Take 3 tablets daily X 2 days, then, Take 2 tablets daily X 2 days, then, Take 1 tablets daily X 1 day. What changed:  medication strength additional instructions   pregabalin 300 MG capsule Commonly known as:  LYRICA Take 300 mg by mouth 2 (two) times daily as needed (pain).   Tezspire 210 MG/1.91ML syringe Generic drug: tezepelumab-ekko Inject 210 mg into the skin every 28 (twenty-eight) days.   topiramate 200 MG tablet Commonly known as: TOPAMAX Take 200 mg by mouth at bedtime.   Trelegy Ellipta 100-62.5-25 MCG/ACT Aepb Generic drug: Fluticasone-Umeclidin-Vilant Inhale 1 puff into the lungs daily.   Vitamin D (Ergocalciferol) 1.25 MG (50000 UNIT) Caps capsule Commonly known as: DRISDOL TAKE 1 CAPSULE BY MOUTH 1 TIME A WEEK What changed: See the new instructions.   Vyvanse 70 MG capsule Generic drug: lisdexamfetamine Take 70 mg by mouth once a week.   zolpidem 10 MG tablet Commonly known as: AMBIEN TAKE 1 TABLET BY MOUTH AT BEDTIME AS NEEDED FOR SLEEP What changed:  how much to take how to take this when to take this additional instructions               Durable Medical Equipment  (From admission, onward)           Start     Ordered   05/25/21 1129  For home use only DME Walker rolling  Once       Question Answer Comment  Walker: With [place]   Patient needs a walker to treat with the following condition Weakness      05/25/21 1129            Wound care:     Discharge Instructions:   Discharge Instructions     Call MD for:  difficulty breathing, headache or visual disturbances   Complete by: As directed    Call MD for:  extreme fatigue   Complete by: As directed    Call MD for:  hives   Complete by: As directed    Call MD for:  persistant dizziness or light-headedness   Complete by: As directed    Call MD for:  persistant nausea and vomiting   Complete by: As directed    Call MD for:  severe uncontrolled pain   Complete by: As directed    Call MD for:  temperature >100.4   Complete by: As directed    Diet - low sodium heart healthy   Complete by: As directed    Discharge instructions   Complete by: As directed    Discharge  instructions for diabetes mellitus: Check blood sugar 3 times a day and bedtime at home. If blood sugar running above 200 or less than 70 please call your MD to adjust insulin. If you notice signs and symptoms of hypoglycemia (low blood sugar) like jitteriness, confusion, thirst, tremor and sweating, please check blood  sugar, drink sugary drink/biscuits/sweets to increase sugar level and call MD or return to ER.    General discharge instructions:  Follow with Primary MD [name], MD in 7 days   Get CBC/BMP checked in next visit within 1 week by PCP or SNF MD. (We routinely change or add medications that can affect your baseline labs and fluid status, therefore we recommend that you get the mentioned basic workup next visit with your PCP, your PCP may decide not to get them or add new tests based on their clinical decision)  On your next visit with your PCP, please get your medicines reviewed and adjusted.  Please request your PCP  to go over all hospital tests, procedures, radiology results at the follow up, please get all Hospital records sent to your PCP by signing hospital release before you go home.  Activity: As tolerated with Full fall precautions use walker/cane & assistance as needed  Avoid using any recreational substances like cigarette, tobacco, alcohol, or non-prescribed drug.  If you experience worsening of your admission symptoms, develop shortness of breath, life threatening emergency, suicidal or homicidal thoughts you must seek medical attention immediately by calling 911 or calling your MD immediately  if symptoms less severe.  You must read complete instructions/literature along with all the possible adverse reactions/side effects for all the medicines you take and that have been prescribed to you. Take any new medicine only after you have completely understood and accepted all the possible adverse reactions/side effects.   Do not drive, operate heavy machinery,  perform activities at heights, swimming or participation in water activities or provide baby sitting services if your were admitted for syncope or siezures until you have seen by Primary MD or a Neurologist and advised to do so again.  Do not drive when taking Pain medications.  Do not take more than prescribed Pain, Sleep and Anxiety Medications  Wear Seat belts while driving.  Please note You were cared for by a hospitalist during your hospital stay. If you have any questions about your discharge medications or the care you received while you were in the hospital after you are discharged, you can call the unit and asked to speak with the hospitalist on call if the hospitalist that took care of you is not available. Once you are discharged, your primary care physician will handle any further medical issues. Please note that NO REFILLS for any discharge medications will be authorized once you are discharged, as it is imperative that you return to your primary care physician (or establish a relationship with a primary care physician if you do not have one) for your aftercare needs so that they can reassess your need for medications and monitor your lab values.   Increase activity slowly   Complete by: As directed        Follow ups:    Follow-up Information     [name], MD Follow up.   Specialty: Family Medicine Contact information: [place] [address] [phone]         Sanda Klein, MD .   Specialty: Cardiology Contact information: [address] [place] [address] [phone]         [name], MD Follow up.   Specialty: Family Medicine Contact information: [place] [address] [phone]         Sanda Klein, MD .   Specialty: Cardiology Contact information: [address] [place] [address] [phone]  Discharge Exam:   Vitals:    05/25/21 1455 05/25/21 2110 05/26/21 0751 05/26/21 0757  BP:  132/76  (!) 109/57  Pulse:  100  80  Resp:  17  18  Temp:  98 F (36.7 C)  98.2 F (36.8 C)  TempSrc:  Oral  Oral  SpO2: 98% 96% 100% 100%  Weight:      Height:        Body mass index is 53.37 kg/m.   General exam: Middle-aged African-American female with morbid obesity Skin: No rashes, lesions or ulcers. HEENT: Atraumatic, normocephalic, no obvious bleeding Lungs: Minimal bilateral wheezing.  No cough today. CVS: Regular rate and rhythm, no murmur GI/Abd soft, nontender, nondistended, bowel sound present CNS: Alert, awake, oriented x3 Psychiatry: Mood appropriate Extremities: Trace bilateral pedal edema, no calf tenderness  Time coordinating discharge: 35 minutes   The results of significant diagnostics from this hospitalization (including imaging, microbiology, ancillary and laboratory) are listed below for reference.    Procedures and Diagnostic Studies:   DG Chest 2 View  Result Date: 05/22/2021 CLINICAL DATA:  Chest pain and shortness of breath. Weakness with 2 falls today. EXAM: CHEST - 2 VIEW COMPARISON:  02/06/2021 FINDINGS: The cardiomediastinal contours are normal. Bronchial thickening appears chronic. Pulmonary vasculature is normal. No consolidation, pleural effusion, or pneumothorax. No acute osseous abnormalities are seen. IMPRESSION: No acute chest findings.  Chronic hyperinflation. Electronically Signed   By: Keith Rake M.D.   On: 05/22/2021 17:59   CT Angio Chest PE W and/or Wo Contrast  Result Date: 05/23/2021 CLINICAL DATA:  Chest pain and shortness of breath. Generalized weakness since earlier today. [place] [address] 2 times today due to the weakness. Taking Eliquis. Ex-smoker. EXAM: CT ANGIOGRAPHY CHEST WITH CONTRAST TECHNIQUE: Multidetector CT imaging of the chest was performed using the standard protocol during bolus administration of intravenous contrast. Multiplanar CT image reconstructions and  MIPs were obtained to evaluate the vascular anatomy. RADIATION DOSE REDUCTION: This exam was performed according to the departmental dose-optimization program which includes automated exposure control, adjustment of the mA and/or kV according to patient size and/or use of iterative reconstruction technique. CONTRAST:  160m OMNIPAQUE IOHEXOL 350 MG/ML SOLN COMPARISON:  10/08/2017 FINDINGS: Cardiovascular: Normally opacified pulmonary arteries with no pulmonary arterial filling defects seen. Mildly enlarged heart. Small amount of left anterior descending coronary artery calcification. Mediastinum/Nodes: No enlarged mediastinal, hilar, or axillary lymph nodes. Thyroid gland, trachea, and esophagus demonstrate no significant findings. Lungs/Pleura: Clear lungs with mild bilateral centrilobular and paraseptal bullous changes. No lung nodules or pleural fluid. Upper Abdomen: Unremarkable. Musculoskeletal: Mild lower thoracic spine degenerative changes. Patchy sclerosis involving the majority of the C7 and T1 vertebrae. There is some sclerosis involving the posterior aspect of the C6 vertebra. These changes have progressed since 10/08/2017, when there was milder, more patchy sclerosis involving those vertebrae at that time. There are mild lower cervical spine degenerative changes. Review of the MIP images confirms the above findings. IMPRESSION: 1. No pulmonary emboli or acute abnormality. 2. Mild bilateral centrilobular and paraseptal bullous emphysema. 3. Mild atheromatous calcifications involving the left anterior descending coronary artery. 4. Progressive changes of discogenic sclerosis involving the C6, C7 and T1 vertebrae. Emphysema (ICD10-J43.9). Electronically Signed   By: SClaudie ReveringM.D.   On: 05/23/2021 15:11     Labs:   Basic Metabolic Panel: Recent Labs  Lab 05/22/21 1727 05/24/21 0156 05/25/21 0113 05/26/21 0224  NA 148* 140 136 137  K 3.3* 3.7 4.3 3.6  CL 103 103  107 105  CO2 _0 GLUCOSE 108* 174* 170* 131*  BUN <5* _1 CREATININE 0.70 0.84 0.90 1.02*  CALCIUM 9.8 8.5* 8.7* 8.8*  MG  --  1.9  --   --   PHOS  --  2.2*  --   --    GFR Estimated Creatinine Clearance: 101.8 mL/min (A) (by C-G formula based on SCr of 1.02 mg/dL (H)). Liver Function Tests: Recent Labs  Lab 05/23/21 0841  AST 16  ALT 11  ALKPHOS 87  BILITOT 0.3  PROT 6.7  ALBUMIN 3.1*   No results for input(s): LIPASE, AMYLASE in the last 168 hours. No results for input(s): AMMONIA in the last 168 hours. Coagulation profile No results for input(s): INR, PROTIME in the last 168 hours.  CBC: Recent Labs  Lab 05/22/21 1727 05/24/21 0156 05/25/21 0113 05/26/21 0224  WBC 13.9* 15.5* 22.2* 23.1*  HGB 12.2 10.8* 10.3* 11.5*  HCT 40.8 35.4* 34.2* 37.4  MCV 83.8 83.5 82.8 83.7  PLT 412* 379 410* 426*   Cardiac Enzymes: No results for input(s): CKTOTAL, CKMB, CKMBINDEX, TROPONINI in the last 168 hours. BNP: Invalid input(s): POCBNP CBG: Recent Labs  Lab 05/25/21 0537 05/25/21 1211 05/25/21 1624 05/25/21 2045 05/26/21 0756  GLUCAP 181* 178* 196* 184* 93   D-Dimer No results for input(s): DDIMER in the last 72 hours. Hgb A1c Recent Labs    05/24/21 0156  HGBA1C 5.9*   Lipid Profile No results for input(s): CHOL, HDL, LDLCALC, TRIG, CHOLHDL, LDLDIRECT in the last 72 hours. Thyroid function studies No results for input(s): TSH, T4TOTAL, T3FREE, THYROIDAB in the last 72 hours.  Invalid input(s): FREET3 Anemia work up No results for input(s): VITAMINB12, FOLATE, FERRITIN, TIBC, IRON, RETICCTPCT in the last 72 hours. Microbiology Recent Results (from the past 240 hour(s))  Resp Panel by RT-PCR (Flu A&B, Covid) Nasopharyngeal Swab     Status: None   Collection Time: 05/22/21  5:32 PM   Specimen: Nasopharyngeal Swab; Nasopharyngeal(NP) swabs in vial transport medium  Result Value Ref Range Status   SARS Coronavirus 2 by RT PCR NEGATIVE NEGATIVE Final    Comment:  (NOTE) SARS-CoV-2 target nucleic acids are NOT DETECTED.  The SARS-CoV-2 RNA is generally detectable in upper respiratory specimens during the acute phase of infection. The lowest concentration of SARS-CoV-2 viral copies this assay can detect is 138 copies/mL. A negative result does not preclude SARS-Cov-2 infection and should not be used as the sole basis for treatment or other patient management decisions. A negative result may occur with  improper specimen collection/handling, submission of specimen other than nasopharyngeal swab, presence of viral mutation(s) within the areas targeted by this assay, and inadequate number of viral copies(<138 copies/mL). A negative result must be combined with clinical observations, patient history, and epidemiological information. The expected result is Negative.  Fact Sheet for Patients:  EntrepreneurPulse.com.au  Fact Sheet for Healthcare Providers:  IncredibleEmployment.be  This test is no t yet approved or cleared by the [place] FDA and  has been authorized for detection and/or diagnosis of SARS-CoV-2 by FDA under an Emergency Use Authorization (EUA). This EUA will remain  in effect (meaning this test can be used) for the duration of the COVID-19 declaration under Section 564(b)(1) of the Act, 21 U.S.C.section 360bbb-3(b)(1), unless the authorization is terminated  or revoked sooner.       Influenza A by PCR NEGATIVE NEGATIVE Final   Influenza B by PCR NEGATIVE NEGATIVE Final  Comment: (NOTE) The Xpert Xpress SARS-CoV-2/FLU/RSV plus assay is intended as an aid in the diagnosis of influenza from Nasopharyngeal swab specimens and should not be used as a sole basis for treatment. Nasal washings and aspirates are unacceptable for Xpert Xpress SARS-CoV-2/FLU/RSV testing.  Fact Sheet for Patients: EntrepreneurPulse.com.au  Fact Sheet for Healthcare  Providers: IncredibleEmployment.be  This test is not yet approved or cleared by the [place] FDA and has been authorized for detection and/or diagnosis of SARS-CoV-2 by FDA under an Emergency Use Authorization (EUA). This EUA will remain in effect (meaning this test can be used) for the duration of the COVID-19 declaration under Section 564(b)(1) of the Act, 21 U.S.C. section 360bbb-3(b)(1), unless the authorization is terminated or revoked.  Performed at [facility], [place] [address] [place], [place] [address]      Signed: Terrilee Croak  [facility] 05/26/2021, 11:05 AM

## 2021-05-27 ENCOUNTER — Other Ambulatory Visit: Payer: Self-pay | Admitting: Pharmacy Technician

## 2021-05-28 ENCOUNTER — Telehealth: Payer: Self-pay

## 2021-05-28 ENCOUNTER — Other Ambulatory Visit: Payer: Self-pay | Admitting: Family Medicine

## 2021-05-28 DIAGNOSIS — J4541 Moderate persistent asthma with (acute) exacerbation: Secondary | ICD-10-CM

## 2021-05-28 NOTE — Telephone Encounter (Signed)
From the discharge call:  She said she feels a little better but then explained that she fell in the hospital on a wet floor that was not marked as wet. She stated that her back side, her left leg and left ankle hurt. She has been taking the percocet for pain but is having difficulty getting out of bed due to the pain.  She said that x-rays were done in the hospital and nothing is fractured. She verbalized how frustrated she was with the hospital staff and does not feel that her needs were addressed appropriately.  She thinks she may benefit from physical therapy. Explained to her that Dr Margarita Rana can address that issue with her.  Explained to her that she can contact Patient Experience Dept at the hospital and report her concerns. Provided her with the main phone   She also explained that she lost her full medicaid coverage and is not eligible for PCS.  She said that her mother's car was put in her name and she lost the Medicaid as a result. She understands that home health care may not provide the personal care services that she would like. She said she understood that  and said her insurance will not cover it.  If she did qualify for home PT, then she may be eligible for a home health aide short term while receiving home PT.   she said she has all medications and did not have any questions about her med regime.  She has O2 but only uses it occasionally. She said she does not have a CPAP machine.  She has an old nebulizer and could use a new one.   Scheduled to see Dr Margarita Rana  on 06/06/2021

## 2021-05-28 NOTE — Telephone Encounter (Signed)
Transition Care Management Follow-up Telephone Call Date of discharge and from where: 05/26/2021, [facility]  How have you been since you were released from the hospital? She said she feels a little better but then explained that she fell in the hospital on a wet floor that was not marked as wet. She stated that her back side, her left leg and left ankle hurt. She has been taking the percocet for pain but is having difficulty getting out of bed due to the pain.  She said that x-rays were done in the hospital and nothing is fractured. She verbalized how frustrated she was with the hospital staff and does not feel that her needs were addressed appropriately.  She thinks she may benefit from physical therapy. Explained to her that Dr Margarita Rana can address that issue with her.  Explained to her that she can contact Patient Experience Dept at the hospital and report her concerns. Provided her with the main phone number for the hospital   Any questions or concerns? Yes - noted above She also explained that she lost her full medicaid coverage and is not eligible for PCS.  She said that her mother's car was put in her name and she lost the Medicaid as a result. She understands that home health care may not provide the personal care services that she would like. She said she understood that  and said her insurance will not cover it.  If she did qualify for home PT, then she may be eligible for a home health aide short term while receiving home PT.   Items Reviewed: Did the pt receive and understand the discharge instructions provided? Yes  Medications obtained and verified? Yes - she said she has all medications and did not have any questions about her med regime.  Other? No  Any new allergies since your discharge? No  Dietary orders reviewed? Yes Do you have support at home? Yes  - she lives alone but has family that can provide assistance   Home Care and Equipment/Supplies: Were home health services  ordered? no If so, what is the name of the agency? N/a  Has the agency set up a time to come to the patient's home? not applicable Were any new equipment or medical supplies ordered?  No What is the name of the medical supply agency? N/a Were you able to get the supplies/equipment? not applicable Do you have any questions related to the use of the equipment or supplies? No  Functional Questionnaire: (I = Independent and D = Dependent) ADLs: independent. Has walker and wheelchair to use as needed.    She has O2 but only uses it occasionally. She said she does not have a CPAP machine.  She has an old nebulizer and could use a new one.   Follow up appointments reviewed:  PCP Hospital f/u appt confirmed? Yes  Scheduled to see Dr Margarita Rana  on 06/06/2021 Specialist Hospital f/u appt confirmed? Yes  Scheduled to see pulmonary - 06/26/2021. Are transportation arrangements needed? No  If their condition worsens, is the pt aware to call PCP or go to the Emergency Dept.? Yes Was the patient provided with contact information for the PCP's office or ED? Yes Was to pt encouraged to call back with questions or concerns? Yes

## 2021-05-28 NOTE — Telephone Encounter (Signed)
This request was addressed in the [facility] call from today

## 2021-05-28 NOTE — Telephone Encounter (Signed)
Requested medication (s) are due for refill today:   Yes  Requested medication (s) are on the active medication list:   Yes  Future visit scheduled:   Seen 3 months ago.   No future visit.   Protocol criteria is met with being seen within a year however there is a note that she needs an OV.  Not sure if this is a recent note or an old one.   Last ordered: 02/27/2021 #90, 0 refill  Returned because wasn't sure whether to refill this or not.      Requested Prescriptions  Pending Prescriptions Disp Refills   montelukast (SINGULAIR) 10 MG tablet [Pharmacy Med Name: MONTELUKAST $RemoveBeforeDEI'10MG'OhRSxnSocILQnhxn$  TABLETS] 90 tablet 0    Sig: TAKE 1 TABLET BY MOUTH EVERY NIGHT AT BEDTIME. OFFICE VISIT NEEDED FOR ADDITIONAL REFILLS     Pulmonology:  Leukotriene Inhibitors Passed - 05/28/2021  6:23 AM      Passed - Valid encounter within last 12 months    Recent Outpatient Visits           3 months ago Moderate persistent asthma with acute exacerbation   [facility] [name], MD   1 year ago Encounter for [employer] annual wellness exam   [place], Enobong, MD   1 year ago Hot flashes   [place], Enobong, MD   1 year ago Moderate persistent asthma with exacerbation   [place], Mio, MD   1 year ago Type 2 diabetes mellitus without complication, without long-term current use of insulin [facility])   [facility] [name], MD

## 2021-06-04 ENCOUNTER — Telehealth: Payer: Self-pay | Admitting: Family Medicine

## 2021-06-04 NOTE — Telephone Encounter (Signed)
Patient's appt on 2/8 has been switched to virtual.

## 2021-06-04 NOTE — Telephone Encounter (Signed)
Patient called in, wants to know if there is anyway she can to her appt virtually for Feb 8. I dont have tha available that day

## 2021-06-06 ENCOUNTER — Ambulatory Visit: Payer: Medicare Other | Attending: Family Medicine | Admitting: Family Medicine

## 2021-06-06 DIAGNOSIS — E1159 Type 2 diabetes mellitus with other circulatory complications: Secondary | ICD-10-CM | POA: Diagnosis not present

## 2021-06-06 DIAGNOSIS — G629 Polyneuropathy, unspecified: Secondary | ICD-10-CM

## 2021-06-06 DIAGNOSIS — I480 Paroxysmal atrial fibrillation: Secondary | ICD-10-CM

## 2021-06-06 DIAGNOSIS — Z1211 Encounter for screening for malignant neoplasm of colon: Secondary | ICD-10-CM

## 2021-06-06 DIAGNOSIS — I152 Hypertension secondary to endocrine disorders: Secondary | ICD-10-CM

## 2021-06-06 DIAGNOSIS — G4733 Obstructive sleep apnea (adult) (pediatric): Secondary | ICD-10-CM

## 2021-06-06 DIAGNOSIS — G4709 Other insomnia: Secondary | ICD-10-CM

## 2021-06-06 MED ORDER — FLUTICASONE PROPIONATE 50 MCG/ACT NA SUSP: 1.0000 | Freq: Every day | NASAL | 1 refills | Status: DC | PRN

## 2021-06-06 MED ORDER — DULOXETINE HCL 60 MG PO CPEP: 60.0000 mg | ORAL_CAPSULE | Freq: Every day | ORAL | 1 refills | Status: DC

## 2021-06-06 MED ORDER — APIXABAN 5 MG PO TABS: 5.0000 mg | ORAL_TABLET | Freq: Two times a day (BID) | ORAL | 1 refills | Status: DC

## 2021-06-06 MED ORDER — CARVEDILOL 6.25 MG PO TABS: 6.2500 mg | ORAL_TABLET | Freq: Two times a day (BID) | ORAL | 1 refills | Status: DC

## 2021-06-06 MED ORDER — AMLODIPINE BESYLATE 10 MG PO TABS: 10.0000 mg | ORAL_TABLET | Freq: Every day | ORAL | 1 refills | Status: DC

## 2021-06-06 MED ORDER — POTASSIUM CHLORIDE CRYS ER 20 MEQ PO TBCR: 20.0000 meq | EXTENDED_RELEASE_TABLET | Freq: Every day | ORAL | 3 refills | Status: DC

## 2021-06-06 NOTE — Progress Notes (Signed)
Virtual Visit via Telephone Note  I connected with April [name], on 06/06/2021 at 11:04 AM by telephone due to the COVID-19 pandemic and verified that I am speaking with the correct person using two identifiers.   Consent: I discussed the limitations, risks, security and privacy concerns of performing an evaluation and management service by telephone and the availability of in person appointments. I also discussed with the patient that there may be a patient responsible charge related to this service. The patient expressed understanding and agreed to proceed.   Location of Patient: Home  Location of Provider: Clinic   Persons participating in Telemedicine visit: April [name] Dr. Margarita Rana     History of Present Illness: April [name] is a 51 y.o. year old female with a history of morbid obesity, major depressive disorder, hypertension, COPD/ Asthma , paroxysmal atrial fibrillation obstructive sleep apnea (not compliant with CPAP) with multiple hospitalizations for asthma exacerbation/COPD , steroid-induced diabetes  (previously on metformin) seen for transition of care visit. She was hospitalized last month for asthma exacerbation from 05/22/2021 through 05/26/2021.  CT was negative for PE.  She was treated with IV Solu-Medrol with improvement in her symptoms.  Subsequently discharged on steroids.  Her nose is stopped up She complains of insomnia followed by being up all day and then she will sleep the next day. She is not using a CPAP machine but states she used it in the hospital and it was not effective. Ambien 51m is ineffective for her insomnia; Vyvanse appears on her med but she states she is not taking this.  She is also asking for prescription for Klonopin which she states she took in the past but I have informed her and never prescribed this for her and I am not sure if she received this from psychiatry or another physician.  She has pain in her feet which is sharp and burning.  She is on Lyrica. She states pain goes all over her body.  Currently followed by pain management. For her diabetes and weight management she is on a GLP-1 receptor agonist. Past Medical History:  Diagnosis Date   Acanthosis nigricans    Anxiety    Arthritis    "knees" (04/28/2017)   Asthma    Followed by Dr. YAnnamaria Boots(pulmonology); receives every other week omalizumab injections; has frequent exacerbations   Back pain    Chronic diastolic CHF (congestive heart failure) [place] 01/17/2017   COPD (chronic obstructive pulmonary disease) ([facility]    PFTs in 2002, FEV1/FVC 65, no post bronchodilater test done   Depression    GERD (gastroesophageal reflux disease)    Headache(784.0)    "q couple days" (194/85/4627   Helicobacter pylori (H. pylori) infection    Hypertension, essential    Insomnia    Joint pain    Lower extremity edema    Menorrhagia    Morbid obesity ([place]    OSA on CPAP    Sleep study 2008 - mild OSA, not enough events to titrate CPAP; wears CPAP now/pt on 04/28/2017   Pneumonia X 1   Prediabetes    Rheumatoid arthritis (HCC)    Seasonal allergies    Shortness of breath    Tobacco user    Vitamin D deficiency    Allergies  Allergen Reactions   Contrast Media [Iodinated Contrast Media] Itching    Ct contrast    Current Outpatient Medications on File Prior to Visit  Medication Sig Dispense Refill   albuterol (PROAIR  HFA) 108 (90 Base) MCG/ACT inhaler INHALE 2 PUFFS EVERY 6 HOURS AS NEEDED (Patient taking differently: Inhale 2 puffs into the lungs every 6 (six) hours as needed for wheezing or shortness of breath.) 8.5 g 10   amLODipine (NORVASC) 10 MG tablet Take 1 tablet (10 mg total) by mouth daily. 90 tablet 1   apixaban (ELIQUIS) 5 MG TABS tablet Take 1 tablet (5 mg total) by mouth 2 (two) times daily. (Patient taking differently: Take 5 mg by mouth daily.) 180 tablet 1   atorvastatin (LIPITOR) 20 MG tablet Take 20 mg by mouth every morning.     baclofen (LIORESAL)  20 MG tablet Take 20 mg by mouth daily as needed for muscle spasms (pain).     benzonatate (TESSALON) 100 MG capsule Take 1 capsule (100 mg total) by mouth every 8 (eight) hours. (Patient not taking: Reported on 05/25/2021) 21 capsule 0   carvedilol (COREG) 6.25 MG tablet Take 1 tablet (6.25 mg total) by mouth 2 (two) times daily with a meal. 180 tablet 1   cloNIDine (CATAPRES) 0.2 MG tablet Take 1 tablet (0.2 mg total) by mouth 2 (two) times daily. 60 tablet 2   DULoxetine (CYMBALTA) 60 MG capsule Take 1 capsule (60 mg total) by mouth daily. 90 capsule 1   EPINEPHrine 0.3 mg/0.3 mL IJ SOAJ injection Inject 0.3 mg into the muscle as needed for anaphylaxis. (Patient taking differently: Inject 0.3 mg into the muscle once as needed for anaphylaxis (asthma).) 1 each 5   fluticasone (FLONASE) 50 MCG/ACT nasal spray Place 1 spray into both nostrils daily. (Patient taking differently: Place 1 spray into both nostrils daily as needed for allergies or rhinitis.) 30 g 2   Fluticasone-Umeclidin-Vilant (TRELEGY ELLIPTA) 100-62.5-25 MCG/INH AEPB Inhale 1 puff into the lungs daily. 60 each 5   furosemide (LASIX) 40 MG tablet Take 1 tablet (40 mg total) by mouth daily. 30 tablet 6   ipratropium-albuterol (DUONEB) 0.5-2.5 (3) MG/3ML SOLN Take 3 mLs by nebulization every 6 (six) hours as needed (shortness of breath/wheezing). 360 mL 12   levothyroxine (SYNTHROID) 50 MCG tablet Take 50 mcg by mouth daily.     montelukast (SINGULAIR) 10 MG tablet TAKE 1 TABLET BY MOUTH AT BEDTIME. OFFICE VISIT NEEDED FOR ADDITIONAL REFILLS (Patient taking differently: Take 10 mg by mouth at bedtime.) 90 tablet 0   naloxone (NARCAN) nasal spray 4 mg/0.1 mL Place 1 spray into the nose once as needed (opioid overdose).     omeprazole (PRILOSEC) 20 MG capsule Take 20 mg by mouth daily as needed (heartburn/indigestion).     predniSONE (DELTASONE) 10 MG tablet Take 4 tablets daily X 2 days, then, Take 3 tablets daily X 2 days, then, Take 2  tablets daily X 2 days, then, Take 1 tablets daily X 1 day. 19 tablet 0   pregabalin (LYRICA) 300 MG capsule Take 300 mg by mouth 2 (two) times daily as needed (pain).     Semaglutide, 1 MG/DOSE, (OZEMPIC, 1 MG/DOSE,) 4 MG/3ML SOPN Inject 1 mg into the skin every Monday.     tezepelumab-ekko (TEZSPIRE) 210 MG/1.91ML syringe Inject 210 mg into the skin every 28 (twenty-eight) days.     topiramate (TOPAMAX) 200 MG tablet Take 200 mg by mouth at bedtime.     Vitamin D, Ergocalciferol, (DRISDOL) 1.25 MG (50000 UT) CAPS capsule TAKE 1 CAPSULE BY MOUTH 1 TIME A WEEK (Patient taking differently: Take 50,000 Units by mouth every Monday.) 9 capsule 1   VYVANSE 70 MG  capsule Take 70 mg by mouth once a week.     zolpidem (AMBIEN) 10 MG tablet TAKE 1 TABLET BY MOUTH AT BEDTIME AS NEEDED FOR SLEEP (Patient taking differently: Take 10 mg by mouth at bedtime.) 30 tablet 5   [DISCONTINUED] mometasone-formoterol (DULERA) 200-5 MCG/ACT AERO Inhale 2 puffs into the lungs 2 (two) times daily. Dx: Asthma 8.8 g 3   No current facility-administered medications on file prior to visit.    ROS: See HPI  Observations/Objective: Awake, alert, oriented x3 Not in acute distress Normal mood   CMP Latest Ref Rng & Units 05/26/2021 05/25/2021 05/24/2021  Glucose 70 - 99 mg/dL 131(H) 170(H) 174(H)  BUN 6 - 20 mg/dL _0 Creatinine 0.44 - 1.00 mg/dL 1.02(H) 0.90 0.84  Sodium 135 - 145 mmol/L 137 136 140  Potassium 3.5 - 5.1 mmol/L 3.6 4.3 3.7  Chloride 98 - 111 mmol/L 105 107 103  CO2 22 - 32 mmol/L _1 Calcium 8.9 - 10.3 mg/dL 8.8(L) 8.7(L) 8.5(L)  Total Protein 6.5 - 8.1 g/dL - - -  Total Bilirubin 0.3 - 1.2 mg/dL - - -  Alkaline Phos 38 - 126 U/L - - -  AST 15 - 41 U/L - - -  ALT 0 - 44 U/L - - -    Lipid Panel     Component Value Date/Time   CHOL 238 (H) 03/30/2020 1128   TRIG 101 02/03/2021 2015   HDL 45 03/30/2020 1128   CHOLHDL 5.3 (H) 03/30/2020 1128   CHOLHDL 3.9 Ratio 10/06/2009 2113    VLDL 13 10/06/2009 2113   LDLCALC 167 (H) 03/30/2020 1128   LABVLDL 26 03/30/2020 1128    Lab Results  Component Value Date   HGBA1C 5.9 (H) 05/24/2021    Assessment and Plan: 1. Hypertension associated with diabetes ([place]) Stable Counseled on blood pressure goal of less than 130/80, low-sodium, DASH diet, medication compliance, 150 minutes of moderate intensity exercise per week. Discussed medication compliance, adverse effects.  - amLODipine (NORVASC) 10 MG tablet; Take 1 tablet (10 mg total) by mouth daily.  Dispense: 90 tablet; Refill: 1 - carvedilol (COREG) 6.25 MG tablet; Take 1 tablet (6.25 mg total) by mouth 2 (two) times daily with a meal.  Dispense: 180 tablet; Refill: 1  2. AF (paroxysmal atrial fibrillation) (HCC) Stable Continue rate control with beta-blocker and anticoagulation with Eliquis - apixaban (ELIQUIS) 5 MG TABS tablet; Take 1 tablet (5 mg total) by mouth 2 (two) times daily.  Dispense: 180 tablet; Refill: 1  3. Neuropathy Uncontrolled She is on Lyrica and also on Cymbalta Also on opioid analgesics. - DULoxetine (CYMBALTA) 60 MG capsule; Take 1 capsule (60 mg total) by mouth daily.  Dispense: 90 capsule; Refill: 1  4. Screening for colon cancer Referred for colonoscopy as she previously received the Cologuard kit but is yet to obtain the specimen.  5. Obstructive sleep apnea Uncontrolled Currently not using his CPAP machine Continues to experience insomnia which could be secondary to uncontrolled obstructive sleep apnea  6.  Insomnia She is currently on 10 mg of Ambien Advised to discuss this with her sleep specialist Dr. Annamaria Boots She is also requesting Klonopin which I never prescribed for her and I have educated her about of her overdose risk score given she is on multiple controlled medications, chronic opioids, a sedative and is requesting benzodiazepines    Follow Up Instructions: 3 months   I discussed the assessment and treatment plan with the  patient. The patient was provided an opportunity to ask questions and all were answered. The patient agreed with the plan and demonstrated an understanding of the instructions.   The patient was advised to call back or seek an in-person evaluation if the symptoms worsen or if the condition fails to improve as anticipated.     I provided 14 minutes total of non-face-to-face time during this encounter.   [name], MD, FAAFP. [facility], [place]   06/06/2021, 11:04 AM

## 2021-06-07 ENCOUNTER — Encounter: Payer: Self-pay | Admitting: Family Medicine

## 2021-06-07 ENCOUNTER — Other Ambulatory Visit: Payer: Self-pay

## 2021-06-07 ENCOUNTER — Ambulatory Visit (INDEPENDENT_AMBULATORY_CARE_PROVIDER_SITE_OTHER): Payer: Medicare Other

## 2021-06-07 VITALS — BP 113/71 | HR 86 | Temp 97.4°F | Resp 20 | Ht 66.0 in | Wt 326.8 lb

## 2021-06-07 DIAGNOSIS — J4552 Severe persistent asthma with status asthmaticus: Secondary | ICD-10-CM | POA: Diagnosis not present

## 2021-06-07 MED ORDER — TEZEPELUMAB-EKKO 210 MG/1.91ML ~~LOC~~ SOSY
210.0000 mg | PREFILLED_SYRINGE | Freq: Once | SUBCUTANEOUS | Status: AC
  Administered 2021-06-07: 210 mg via SUBCUTANEOUS

## 2021-06-07 NOTE — Progress Notes (Signed)
Diagnosis: Asthma  Provider:  Marshell Garfinkel, MD  Procedure: Injection  Tezepelumab (Tezspire), Dose: 210mg  , Site: subcutaneous, Number of injections: 1  Discharge: Condition: Good, Destination: Home . AVS declined.  Performed by:  Koren Shiver, RN

## 2021-06-21 ENCOUNTER — Telehealth: Payer: Self-pay | Admitting: Pharmacy Technician

## 2021-06-21 NOTE — Telephone Encounter (Signed)
Auth Submission: PENDING SECONDARY INSURANCE - BCBS (new) Payer: BCBS Medication & CPT/J Code(s) submitted: TEZSPIRE Route of submission (phone, fax, portal): COVER MY MEDS Auth type: Buy/Bill Units/visits requested: 210MG  Q28 DAYS Reference number: [id number].  Patient has [employer] as primary.  approved 03/05/21 - 09/03/21. [place] auth# [id number]

## 2021-06-23 NOTE — Progress Notes (Unsigned)
Patient ID: [name], female    DOB: 10/05/1970, 51 y.o.   MRN: [id number]   HPI F former smoker followed for chronic severe asthma/ Xolair complicated by non-compliance,  OSA/ CPAP,  morbid obesity, HBP, depression, GERD, AFib/ Eliquis  NPSG 03/31/07- AHI 5.8/ hr, weight 330 lbs. She canceled planned more recent study. Office spirometry 10/24/14- severe obstructive airways disease. FVC 2.10/66%, FEV1 1.25/47%, FEV1/FVC 0.59, FEF 25-75 percent 0.64/19% Unattended HST 07/06/16- AHI 11/hour, desaturation to 74%. Office Spirometry 07/29/2016-moderately severe obstructive airways disease. FVC 2.21/68%, FEV1 1.53/57%, ratio 0.69, FEF 25-75% 0.98/34% Prolonged hosp [facility] 4/19- tracheostomy. Dupixent enrolled 10/22/2018 HST 12/26/2018- AHI 9.7/ hr, desaturation to 9.7/ hr, desaturation to 69% -------------------------------------------------------   02/15/21- 51 year old female Smoker followed for chronic severe asthma/Xolair, Tracheostomy at [facility] 2019, Insomnia,  complicated by noncompliance, OSA/ oral appliance, morbid obesity, HTN, depression, GERD Covid infection, PAFib, dCHF, Dyslipidemia,  Dupixent enrolled 10/22/2018-- every 2 weeks -Proair hfa, Trelegy  100, Singulair, Neb Duoneb, Dupixent, Ambien 10, Vyvanse,  Hosp f/u dc'd 10/12- COPD exacerbation, Acute Resp Failure Hypoxia> Zith, pred taper,  Noted desaturation at night, rec updated sleep study On prednisone taper, still feeling activel wheeze. Not using neb. Denies knowing why she has has status asthma over past month. Denies smoking but asks patchess, saying she still 'get the urge". Discussed pharmacy smoking program.  Daughter and grandkids living with her> stress.  CXR 02/06/21 1V FINDINGS: The cardiac silhouette, mediastinal and hilar contours are within normal limits. The lungs are clear. No infiltrates, edema or effusions. The bony thorax is intact. IMPRESSION: No acute cardiopulmonary findings.  06/26/21- 51 year old  female Smoker followed for chronic severe asthma/Xolair, Tracheostomy at [facility] 2019, Insomnia,  complicated by noncompliance, OSA/ oral appliance, morbid obesity, HTN, depression, GERD Covid infection, PAFib, dCHF, Dyslipidemia,  Dupixent enrolled 10/22/2018-- every 2 weeks -Proair hfa, Trelegy  100, Singulair, Neb Duoneb, Dupixent> Tezspire, Ambien 10, Vyvanse,  Hosp 1/24-1/28- Exacerb Asthma/ COPD > dc on steroid taper  CTaPE- 05/23/21- IMPRESSION: 1. No pulmonary emboli or acute abnormality. 2. Mild bilateral centrilobular and paraseptal bullous emphysema. 3. Mild atheromatous calcifications involving the left anterior descending coronary artery. 4. Progressive changes of discogenic sclerosis involving the C6, C7 and T1 vertebrae.   Emphysema (ICD10-J43.9).   Review of Systems-See HPI  + = positive Constitutional:   + weight loss, night sweats, fevers, chills, +fatigue, lassitude. HEENT:   No-  headaches, difficulty swallowing, tooth/dental problems, sore throat,    sneezing, itching, ear ache, +nasal congestion, post nasal drip,  CV:  No-   chest pain, orthopnea, PND, swelling in lower extremities, anasarca, dizziness, palpitations Resp: +shortness of breath with exertion or at rest.              productive cough,  + non-productive cough,  No- coughing up of blood.              No-   change in color of mucus. +wheezing.   Skin: No-   rash or lesions. GI:  No-   heartburn, indigestion, abdominal pain, nausea, vomiting,  GU: MS:  No-   joint pain or swelling.   Neuro-     nothing unusual Psych:  No- change in mood or affect. No depression or anxiety.  No memory loss.    Objective:   Physical Exam    General- Alert, Oriented, Affect-appropriate, Distress- none acute, + morbid  obesity,  Skin- rash-none, lesions- none, excoriation- none Lymphadenopathy- none Head- atraumatic  Eyes- Gross vision intact, PERRLA, conjunctivae clear secretions            Ears- Hearing,  canals-normal            Nose- Clear, no-Septal dev, mucus, polyps, erosion, perforation.            Throat- Mallampati III , mucosa clear , drainage- none, tonsils- atrophic,                           + Missing  teeth, +upper denture plate is out                  Neck- flexible , trachea+ stoma scar, no stridor , thyroid nl, carotid no bruit Chest - symmetrical excursion , unlabored           Heart/CV- RRR , no murmur , no gallop  , no rub, nl s1 s2                           - JVD- none , edema- none, stasis changes- none, varices- none           Lung- +Diminished, . Wheeze + tight/ diffuse, cough-none,                          dullness-none,  rub- none            Chest wall Abd-  Br/ Gen/ Rectal- Not done, not indicated Extrem- cyanosis- none, clubbing, none, atrophy- none, strength- nl Neuro- grossly intact to observation

## 2021-06-26 ENCOUNTER — Telehealth: Payer: Self-pay | Admitting: Internal Medicine

## 2021-06-26 ENCOUNTER — Ambulatory Visit: Payer: Medicare Other | Admitting: Internal Medicine

## 2021-06-26 MED ORDER — ALBUTEROL SULFATE HFA 108 (90 BASE) MCG/ACT IN AERS: 2.0000 | INHALATION_SPRAY | Freq: Four times a day (QID) | RESPIRATORY_TRACT | 11 refills | Status: DC | PRN

## 2021-06-26 NOTE — Telephone Encounter (Signed)
Rx for pt's rescue inhaler has been sent to preferred pharmacy for pt. Attempted to call pt to let her know that this was done but unable to reach. Left a detailed message for pt to let her know this was done. Nothing further needed.

## 2021-06-29 ENCOUNTER — Encounter (HOSPITAL_COMMUNITY): Payer: Self-pay

## 2021-06-29 ENCOUNTER — Ambulatory Visit (HOSPITAL_COMMUNITY)
Admission: EM | Admit: 2021-06-29 | Discharge: 2021-06-29 | Disposition: A | Discharge status: Home / Self Care | Payer: Medicare Other | Attending: Internal Medicine | Admitting: Internal Medicine

## 2021-06-29 ENCOUNTER — Other Ambulatory Visit: Payer: Self-pay

## 2021-06-29 ENCOUNTER — Ambulatory Visit: Payer: Medicare Other | Admitting: Adult Health

## 2021-06-29 DIAGNOSIS — M25561 Pain in right knee: Secondary | ICD-10-CM

## 2021-06-29 MED ORDER — DEXAMETHASONE SODIUM PHOSPHATE 10 MG/ML IJ SOLN
10.0000 mg | Freq: Once | INTRAMUSCULAR | Status: AC
  Administered 2021-06-29: 10 mg via INTRAMUSCULAR

## 2021-06-29 MED ORDER — DEXAMETHASONE SODIUM PHOSPHATE 10 MG/ML IJ SOLN
INTRAMUSCULAR | Status: AC
  Filled 2021-06-29: qty 1

## 2021-06-29 NOTE — Discharge Instructions (Addendum)
Gentle range of motion exercises ?Take medications as prescribed ?Icing of the right knee ?Return to urgent care if symptoms worsen. ?

## 2021-06-29 NOTE — ED Provider Notes (Signed)
[place]    CSN: [id number] Arrival date & time: 06/29/21  1646      History   Chief Complaint Chief Complaint  Patient presents with   Knee Pain    HPI April [name] is a 51 y.o. female with a history of chronic right knee pain comes to the urgent care with worsening right knee pain of a few days duration.  In the past, patient had been treated with steroid injections into the knee.  She has not had steroid injection in a while.  Pain is throbbing, moderate severity, aggravated by movement and is not relieved with narcotics use.  Patient fell a month ago but the pain associated with the fall subsided a few days after the fall.  Patient denies any knee swelling or bruising.  No numbness or tingling in the lower extremities.  No redness of the right knee.  HPI  Past Medical History:  Diagnosis Date   Acanthosis nigricans    Anxiety    Arthritis    "knees" (04/28/2017)   Asthma    Followed by Dr. Annamaria Boots (pulmonology); receives every other week omalizumab injections; has frequent exacerbations   Back pain    Chronic diastolic CHF (congestive heart failure) ([place]) 01/17/2017   COPD (chronic obstructive pulmonary disease) ([place])    PFTs in 2002, FEV1/FVC 65, no post bronchodilater test done   Depression    GERD (gastroesophageal reflux disease)    Headache(784.0)    "q couple days" (74/03/8785)   Helicobacter pylori (H. pylori) infection    Hypertension, essential    Insomnia    Joint pain    Lower extremity edema    Menorrhagia    Morbid obesity ([place])    OSA on CPAP    Sleep study 2008 - mild OSA, not enough events to titrate CPAP; wears CPAP now/pt on 04/28/2017   Pneumonia X 1   Prediabetes    Rheumatoid arthritis (HCC)    Seasonal allergies    Shortness of breath    Tobacco user    Vitamin D deficiency     Patient Active Problem List   Diagnosis Date Noted   COPD with exacerbation ([place]) 02/04/2021   Severe persistent asthma 07/04/2020   Acute  respiratory failure ([place]) 02/20/2019   CHF exacerbation ([place]) 02/20/2019   Hot flashes    COPD exacerbation ([place]) 10/26/2018   Acute respiratory distress 08/03/2018   Acute exacerbation of chronic obstructive pulmonary disease (COPD) ([place]) 08/03/2018   Vitamin D deficiency 05/26/2018   Acute asthma exacerbation 05/16/2018   Urinary incontinence 11/17/2017   Nausea with vomiting 11/06/2017   Hypomagnesemia 10/27/2017   Dysphagia 10/27/2017   Physical deconditioning 10/25/2017   Acute maxillary sinusitis 10/24/2017   Emesis, persistent 10/08/2017   Volume depletion 10/08/2017   Pleuritic chest pain 10/08/2017   SOB (shortness of breath) 10/08/2017   Debility 09/22/2017   HCAP (healthcare-associated pneumonia) 08/06/2017   Chronic atrial fibrillation 07/29/2017   Acute respiratory failure with hypoxia ([place]) 07/07/2017   AF (paroxysmal atrial fibrillation) ([place])    Dyspnea 02/04/2017   Asthma exacerbation 76/72/0947   Metabolic syndrome 09/62/8366   Moderate persistent asthma with exacerbation 10/19/2016   Normocytic anemia 10/02/2016   Elevated hemoglobin A1c 05/05/2016   GERD (gastroesophageal reflux disease) 08/30/2015   Anxiety and depression 08/30/2015   Generalized anxiety disorder 08/30/2015   Seasonal allergic rhinitis 08/29/2013   Leukocytosis 10/21/2012   Tobacco abuse in remission 10/07/2012   COPD mixed type ([place]) 05/07/2012  Knee pain, bilateral 04/25/2011   Insomnia 03/14/2011   Obstructive sleep apnea 12/19/2010   Hypokalemia 08/13/2010   Cervical back pain with evidence of disc disease 04/08/2008   HTN (hypertension) 07/31/2006   Morbid obesity due to excess calories ([place]) 06/17/2006   Major depressive disorder, recurrent episode ([place]) 04/10/2006    Past Surgical History:  Procedure Laterality Date   CARDIOVERSION N/A 05/30/2017   Procedure: CARDIOVERSION;  Surgeon: Sanda Klein, MD;  Location: [facility];  Service: Cardiovascular;  Laterality: N/A;    REDUCTION MAMMAPLASTY Bilateral 09/2011   TUBAL LIGATION  1996   bilateral    OB History     Gravida  4   Para  4   Term      Preterm      AB      Living         SAB      IAB      Ectopic      Multiple      Live Births               Home Medications    Prior to Admission medications   Medication Sig Start Date End Date Taking? Authorizing Provider  albuterol (PROAIR HFA) 108 (90 Base) MCG/ACT inhaler Inhale 2 puffs into the lungs every 6 (six) hours as needed for wheezing or shortness of breath. 06/26/21   Baird Lyons D, MD  amLODipine (NORVASC) 10 MG tablet Take 1 tablet (10 mg total) by mouth daily. 06/06/21   [name], MD  apixaban (ELIQUIS) 5 MG TABS tablet Take 1 tablet (5 mg total) by mouth 2 (two) times daily. 06/06/21   [name], MD  atorvastatin (LIPITOR) 20 MG tablet Take 20 mg by mouth every morning. 10/02/20   [provider]  baclofen (LIORESAL) 20 MG tablet Take 20 mg by mouth daily as needed for muscle spasms (pain). 01/08/21   [provider]  carvedilol (COREG) 6.25 MG tablet Take 1 tablet (6.25 mg total) by mouth 2 (two) times daily with a meal. 06/06/21   [name], MD  cloNIDine (CATAPRES) 0.2 MG tablet Take 1 tablet (0.2 mg total) by mouth 2 (two) times daily. 05/26/21 08/24/21  Terrilee Croak, MD  DULoxetine (CYMBALTA) 60 MG capsule Take 1 capsule (60 mg total) by mouth daily. 06/06/21   [name], MD  EPINEPHrine 0.3 mg/0.3 mL IJ SOAJ injection Inject 0.3 mg into the muscle as needed for anaphylaxis. Patient taking differently: Inject 0.3 mg into the muscle once as needed for anaphylaxis (asthma). 05/30/20   Deneise Lever, MD  fluticasone (FLONASE) 50 MCG/ACT nasal spray Place 1 spray into both nostrils daily as needed for allergies or rhinitis. 06/06/21   [name], MD  Fluticasone-Umeclidin-Vilant (TRELEGY ELLIPTA) 100-62.5-25 MCG/INH AEPB Inhale 1 puff into the lungs daily. 01/23/21   Deneise Lever,  MD  furosemide (LASIX) 40 MG tablet Take 1 tablet (40 mg total) by mouth daily. 02/23/20   [name], MD  ipratropium-albuterol (DUONEB) 0.5-2.5 (3) MG/3ML SOLN Take 3 mLs by nebulization every 6 (six) hours as needed (shortness of breath/wheezing). 02/15/21   Deneise Lever, MD  levothyroxine (SYNTHROID) 50 MCG tablet Take 50 mcg by mouth daily. 10/15/20   [provider]  montelukast (SINGULAIR) 10 MG tablet TAKE 1 TABLET BY MOUTH AT BEDTIME. OFFICE VISIT NEEDED FOR ADDITIONAL REFILLS Patient taking differently: Take 10 mg by mouth at bedtime. 02/27/21   [name], MD  naloxone [facility]) nasal spray 4  mg/0.1 mL Place 1 spray into the nose once as needed (opioid overdose). 08/09/20   [provider]  omeprazole (PRILOSEC) 20 MG capsule Take 20 mg by mouth daily as needed (heartburn/indigestion).    [provider]  potassium chloride SA (KLOR-CON M) 20 MEQ tablet Take 1 tablet (20 mEq total) by mouth daily. 06/06/21   [name], MD  predniSONE (DELTASONE) 10 MG tablet Take 4 tablets daily X 2 days, then, Take 3 tablets daily X 2 days, then, Take 2 tablets daily X 2 days, then, Take 1 tablets daily X 1 day. 05/26/21   Terrilee Croak, MD  pregabalin (LYRICA) 300 MG capsule Take 300 mg by mouth 2 (two) times daily as needed (pain). 01/08/21   [provider]  Semaglutide, 1 MG/DOSE, (OZEMPIC, 1 MG/DOSE,) 4 MG/3ML SOPN Inject 1 mg into the skin every Monday.    [provider]  tezepelumab-ekko (TEZSPIRE) 210 MG/1.91ML syringe Inject 210 mg into the skin every 28 (twenty-eight) days. 04/12/21   [provider]  topiramate (TOPAMAX) 200 MG tablet Take 200 mg by mouth at bedtime. 01/08/21   [provider]  VYVANSE 70 MG capsule Take 70 mg by mouth once a week. 01/20/19   [provider]  zolpidem (AMBIEN) 10 MG tablet TAKE 1 TABLET BY MOUTH AT BEDTIME AS NEEDED FOR SLEEP Patient taking differently: Take 10 mg by mouth at  bedtime. 01/26/21   Young, Tarri Fuller D, MD  mometasone-formoterol (DULERA) 200-5 MCG/ACT AERO Inhale 2 puffs into the lungs 2 (two) times daily. Dx: Asthma 04/08/19 08/12/19  Deneise Lever, MD    Family History Family History  Problem Relation Age of Onset   Hypertension Mother    Asthma Daughter    Cancer Paternal Aunt    Asthma Maternal Grandmother     Social History Social History   Tobacco Use   Smoking status: Former    Packs/day: 0.50    Years: 26.00    Pack years: 13.00    Types: Cigarettes    Quit date: 09/12/2014    Years since quitting: 6.8   Smokeless tobacco: Never  Vaping Use   Vaping Use: Never used  Substance Use Topics   Alcohol use: No   Drug use: No     Allergies   Contrast media [iodinated contrast media]   Review of Systems Review of Systems  Musculoskeletal:  Positive for arthralgias. Negative for gait problem, joint swelling and myalgias.  Skin: Negative.  Negative for color change.    Physical Exam Triage Vital Signs ED Triage Vitals  Enc Vitals Group     BP 06/29/21 1740 135/86     Pulse Rate 06/29/21 1740 82     Resp 06/29/21 1740 18     Temp 06/29/21 1740 98 F (36.7 C)     Temp Source 06/29/21 1740 Oral     SpO2 06/29/21 1740 100 %     Weight --      Height --      Head Circumference --      Peak Flow --      Pain Score 06/29/21 1745 9     Pain Loc --      Pain Edu? --      Excl. in [place]? --    No data found.  Updated Vital Signs BP 135/86 (BP Location: Right Arm)    Pulse 82    Temp 98 F (36.7 C) (Oral)    Resp 18  SpO2 100%   Visual Acuity Right Eye Distance:   Left Eye Distance:   Bilateral Distance:    Right Eye Near:   Left Eye Near:    Bilateral Near:     Physical Exam Vitals and nursing note reviewed.  Constitutional:      General: She is not in acute distress.    Appearance: She is not ill-appearing.  Cardiovascular:     Rate and Rhythm: Normal rate and regular rhythm.     Pulses: Normal pulses.      Heart sounds: Normal heart sounds.  Pulmonary:     Effort: Pulmonary effort is normal.     Breath sounds: Normal breath sounds.  Musculoskeletal:        General: Tenderness present. No deformity or signs of injury. Normal range of motion.     Comments: Tenderness over the medial aspect of the right knee.  Neurological:     Mental Status: She is alert.     UC Treatments / Results  Labs (all labs ordered are listed, but only abnormal results are displayed) Labs Reviewed - No data to display  EKG   Radiology No results found.  Procedures Procedures (including critical care time)  Medications Ordered in UC Medications  dexamethasone (DECADRON) injection 10 mg (10 mg Intramuscular Given 06/29/21 1824)    Initial Impression / Assessment and Plan / UC Course  I have reviewed the triage vital signs and the nursing notes.  Pertinent labs & imaging results that were available during my care of the patient were reviewed by me and considered in my medical decision making (see chart for details).     1.  Exacerbation of chronic right knee pain: Patient has tried NSAIDs, narcotics with no improvement in symptoms Decadron 10 mg IM x1 dose Patient prefers IM steroids to oral steroids Gentle range of motion advised Icing of the right knee Patient may need to start steroid injections into the knee since that helped her a lot. Return precautions given. Final Clinical Impressions(s) / UC Diagnoses   Final diagnoses:  Recurrent pain of right knee   Discharge Instructions   None    ED Prescriptions   None    I have reviewed the PDMP during this encounter.   Chase Picket, MD 06/29/21 [phone]

## 2021-06-29 NOTE — ED Triage Notes (Signed)
Pt presents with ongoing right knee pain following a fall X 1 month ago. ?

## 2021-07-02 ENCOUNTER — Ambulatory Visit: Payer: Self-pay | Admitting: *Deleted

## 2021-07-02 NOTE — Telephone Encounter (Signed)
?  Summary: wnats sooner appt knee injection  ? Pls per pt request call her back, she wants an injection in her knee and the soonest appt for dr at [facility] is end of March, offered mobile bus but she is not happy at all stating that she will have to find another provider. Wants a nurse cb at [phone]   ?  ? ? ? ?Chief Complaint: requesting appt for cortisone shot ?Symptoms: right knee pain severe, and difficulty walking, using walker and unable to make it to bathroom fast enough due to knee pain and incontinent.  ?Frequency: worsening this week ?Pertinent Negatives: Patient denies chest pain, difficulty breathing, no fever, denies swelling in knee, pain medication not relieving pain like before.  ?Disposition: '[x]'$ ED /'[]'$ Urgent Care (no appt availability in office) / '[]'$ Appointment(In office/virtual)/ '[]'$  [facility]/ '[]'$ Home Care/ '[x]'$ Refused Recommended Disposition /'[x]'$ [facility]/ '[]'$  Follow-up with PCP ?Additional Notes:  ? ?Patient reports she has been to UC and was told to f/u with PCP. No available appt until 07/26/21. Did not schedule appt due to c/o severe pain and difficulty walking. Recommended ED or PCE walkin with [facility]. Patient reports she would like to see PCP. Please advise if appt can be scheduled. Patient requesting a call back what PCP recommends.  ? ? ? ? ? ? ?Reason for Disposition ? [1] SEVERE pain (e.g., excruciating, unable to walk) AND [2] not improved after 2 hours of pain medicine ? ?Answer Assessment - Initial Assessment Questions ?1. LOCATION and RADIATION: "Where is the pain located?"  ?    Right knee  ?2. QUALITY: "What does the pain feel like?"  (e.g., sharp, dull, aching, burning) ?   Sharp , aching , dull, burning ?3. SEVERITY: "How bad is the pain?" "What does it keep you from doing?"   (Scale 1-10; or mild, moderate, severe) ?  -  MILD (1-3): doesn't interfere with normal activities  ?  -  MODERATE (4-7): interferes with normal activities (e.g., work or  school) or awakens from sleep, limping  ?  -  SEVERE (8-10): excruciating pain, unable to do any normal activities, unable to walk ?    Moderate to severe, stiffness of leg  ?4. ONSET: "When did the pain start?" "Does it come and go, or is it there all the time?" ?    Constant even with laying down  ?5. RECURRENT: "Have you had this pain before?" If Yes, ask: "When, and what happened then?" ?    Yes  ?6. SETTING: "Has there been any recent work, exercise or other activity that involved that part of the body?"  ?    No  ?7. AGGRAVATING FACTORS: "What makes the knee pain worse?" (e.g., walking, climbing stairs, running) ?    Can not walk up and down steps. Difficulty walking uses a walker  ?8. ASSOCIATED SYMPTOMS: "Is there any swelling or redness of the knee?" ?    No  ?9. OTHER SYMPTOMS: "Do you have any other symptoms?" (e.g., chest pain, difficulty breathing, fever, calf pain) ?    No  ?10. PREGNANCY: "Is there any chance you are pregnant?" "When was your last menstrual period?" ?      na ? ?Protocols used: Knee Pain-A-AH ? ?

## 2021-07-04 ENCOUNTER — Ambulatory Visit: Payer: Self-pay

## 2021-07-04 ENCOUNTER — Other Ambulatory Visit: Payer: Self-pay

## 2021-07-04 ENCOUNTER — Ambulatory Visit (INDEPENDENT_AMBULATORY_CARE_PROVIDER_SITE_OTHER): Payer: Medicare Other | Admitting: Orthopaedic Surgery

## 2021-07-04 DIAGNOSIS — Z6841 Body Mass Index (BMI) 40.0 and over, adult: Secondary | ICD-10-CM | POA: Insufficient documentation

## 2021-07-04 DIAGNOSIS — T8484XA Pain due to internal orthopedic prosthetic devices, implants and grafts, initial encounter: Secondary | ICD-10-CM | POA: Insufficient documentation

## 2021-07-04 DIAGNOSIS — M1711 Unilateral primary osteoarthritis, right knee: Secondary | ICD-10-CM | POA: Diagnosis not present

## 2021-07-04 MED ORDER — LIDOCAINE HCL 1 % IJ SOLN
2.0000 mL | INTRAMUSCULAR | Status: AC | PRN
  Administered 2021-07-04: 2 mL

## 2021-07-04 MED ORDER — METHYLPREDNISOLONE ACETATE 40 MG/ML IJ SUSP
40.0000 mg | INTRAMUSCULAR | Status: AC | PRN
  Administered 2021-07-04: 40 mg via INTRA_ARTICULAR

## 2021-07-04 MED ORDER — BUPIVACAINE HCL 0.5 % IJ SOLN
2.0000 mL | INTRAMUSCULAR | Status: AC | PRN
  Administered 2021-07-04: 2 mL via INTRA_ARTICULAR

## 2021-07-04 NOTE — Progress Notes (Signed)
? ?Office Visit Note ?  ?Patient: April [name]           ?Date of Birth: Feb 10, 1971           ?MRN: [id number] ?Visit Date: 07/04/2021 ?             ?Requested by: April Rakes, MD ?[place] ?[name],  [name] [id number] ?PCP: April Rakes, MD ? ? ?Assessment & Plan: ?Visit Diagnoses:  ?1. Primary osteoarthritis of right knee   ?2. Body mass index 50.0-59.9, adult ([place])   ?3. Morbid obesity ([place])   ? ? ?Plan: Impression is advanced valgus DJD with bone-on-bone joint space narrowing.  I reviewed the x-rays and findings with the patient detail her disease is severe and will likely come down to her knee replacement however currently her BMI is greater than 50 and not a surgical candidate.  She understands the importance of weight loss.  Based on available treatment options we went ahead with a cortisone injection today.  We will see her back as needed. ? ?The patient meets the AMA guidelines for Morbid (severe) obesity with a BMI > 40.0 and I have recommended weight loss. ? ?Follow-Up Instructions: No follow-ups on file.  ? ?Orders:  ?Orders Placed This Encounter  ?Procedures  ? Large Joint Inj  ? XR KNEE 3 VIEW RIGHT  ? ?No orders of the defined types were placed in this encounter. ? ? ? ? Procedures: ?Large Joint Inj: R knee on 07/04/2021 10:45 AM ?Indications: pain ?Details: 22 G needle ? ?Arthrogram: No ? ?Medications: 40 mg methylPREDNISolone acetate 40 MG/ML; 2 mL lidocaine 1 %; 2 mL bupivacaine 0.5 % ?Consent was given by the patient. Patient was prepped and draped in the usual sterile fashion.  ? ? ? ? ?Clinical Data: ?No additional findings. ? ? ?Subjective: ?Chief Complaint  ?Patient presents with  ? Right Knee - Pain  ? ? ?HPI ? ?[name] is a very pleasant 51 year old female new patient here to be evaluated for chronic right knee pain.  She has had ongoing problems with this knee.  We saw her back in 2018 for this.  Cortisone injection was administered at that time with good relief.  She states that  there was a period time during which she lost 60 pounds and her knee pain was greatly improved but unfortunately she gained the weight back and the pain has returned.  She is currently in a pain clinic for chronic pain.  She takes meloxicam and oxycodone.  Denies any injuries or traumas. ? ?Review of Systems  ?Constitutional: Negative.   ?HENT: Negative.    ?Eyes: Negative.   ?Respiratory: Negative.    ?Cardiovascular: Negative.   ?Endocrine: Negative.   ?Musculoskeletal: Negative.   ?Neurological: Negative.   ?Hematological: Negative.   ?Psychiatric/Behavioral: Negative.    ?All other systems reviewed and are negative. ? ? ?Objective: ?Vital Signs: There were no vitals taken for this visit. ? ?Physical Exam ?Vitals and nursing note reviewed.  ?Constitutional:   ?   Appearance: She is well-developed.  ?HENT:  ?   Head: Normocephalic and atraumatic.  ?Pulmonary:  ?   Effort: Pulmonary effort is normal.  ?Abdominal:  ?   Palpations: Abdomen is soft.  ?Musculoskeletal:  ?   Cervical back: Neck supple.  ?Skin: ?   General: Skin is warm.  ?   Capillary Refill: Capillary refill takes less than 2 seconds.  ?Neurological:  ?   Mental Status: She is alert and oriented to  person, place, and time.  ?Psychiatric:     ?   Behavior: Behavior normal.     ?   Thought Content: Thought content normal.     ?   Judgment: Judgment normal.  ? ? ?Ortho Exam ? ?Examination of the right knee is limited by his soft tissue envelope.  Unable to tell if there is an effusion.  Pain throughout arc of motion.  Collaterals and cruciates are stable. ? ?Specialty Comments:  ?No specialty comments available. ? ?Imaging: ?No results found. ? ? ?PMFS History: ?Patient Active Problem List  ? Diagnosis Date Noted  ? Primary osteoarthritis of right knee 07/04/2021  ? Body mass index 50.0-59.9, adult ([place]) 07/04/2021  ? Painful orthopaedic hardware ([place]) 07/04/2021  ? COPD with exacerbation ([place]) 02/04/2021  ? Severe persistent asthma 07/04/2020  ? Acute  respiratory failure ([place]) 02/20/2019  ? CHF exacerbation ([place]) 02/20/2019  ? Hot flashes   ? COPD exacerbation ([place]) 10/26/2018  ? Acute respiratory distress 08/03/2018  ? Acute exacerbation of chronic obstructive pulmonary disease (COPD) ([place]) 08/03/2018  ? Vitamin D deficiency 05/26/2018  ? Acute asthma exacerbation 05/16/2018  ? Urinary incontinence 11/17/2017  ? Nausea with vomiting 11/06/2017  ? Hypomagnesemia 10/27/2017  ? Dysphagia 10/27/2017  ? Physical deconditioning 10/25/2017  ? Acute maxillary sinusitis 10/24/2017  ? Emesis, persistent 10/08/2017  ? Volume depletion 10/08/2017  ? Pleuritic chest pain 10/08/2017  ? SOB (shortness of breath) 10/08/2017  ? Debility 09/22/2017  ? HCAP (healthcare-associated pneumonia) 08/06/2017  ? Chronic atrial fibrillation 07/29/2017  ? Acute respiratory failure with hypoxia ([place]) 07/07/2017  ? AF (paroxysmal atrial fibrillation) ([place])   ? Dyspnea 02/04/2017  ? Asthma exacerbation 01/17/2017  ? Metabolic syndrome 62/83/1517  ? Moderate persistent asthma with exacerbation 10/19/2016  ? Normocytic anemia 10/02/2016  ? Elevated hemoglobin A1c 05/05/2016  ? GERD (gastroesophageal reflux disease) 08/30/2015  ? Anxiety and depression 08/30/2015  ? Generalized anxiety disorder 08/30/2015  ? Seasonal allergic rhinitis 08/29/2013  ? Leukocytosis 10/21/2012  ? Tobacco abuse in remission 10/07/2012  ? COPD mixed type ([place]) 05/07/2012  ? Knee pain, bilateral 04/25/2011  ? Insomnia 03/14/2011  ? Obstructive sleep apnea 12/19/2010  ? Hypokalemia 08/13/2010  ? Cervical back pain with evidence of disc disease 04/08/2008  ? HTN (hypertension) 07/31/2006  ? Morbid obesity due to excess calories ([place]) 06/17/2006  ? Major depressive disorder, recurrent episode ([place]) 04/10/2006  ? ?Past Medical History:  ?Diagnosis Date  ? Acanthosis nigricans   ? Anxiety   ? Arthritis   ? "knees" (04/28/2017)  ? Asthma   ? Followed by Dr. Annamaria Boots (pulmonology); receives every other week omalizumab injections;  has frequent exacerbations  ? Back pain   ? Chronic diastolic CHF (congestive heart failure) ([place]) 01/17/2017  ? COPD (chronic obstructive pulmonary disease) ([place])   ? PFTs in 2002, FEV1/FVC 65, no post bronchodilater test done  ? Depression   ? GERD (gastroesophageal reflux disease)   ? Headache(784.0)   ? "q couple days" (04/28/2017)  ? Helicobacter pylori (H. pylori) infection   ? Hypertension, essential   ? Insomnia   ? Joint pain   ? Lower extremity edema   ? Menorrhagia   ? Morbid obesity ([place])   ? OSA on CPAP   ? Sleep study 2008 - mild OSA, not enough events to titrate CPAP; wears CPAP now/pt on 04/28/2017  ? Pneumonia X 1  ? Prediabetes   ? Rheumatoid arthritis ([place])   ? Seasonal allergies   ?  Shortness of breath   ? Tobacco user   ? Vitamin D deficiency   ?  ?Family History  ?Problem Relation Age of Onset  ? Hypertension Mother   ? Asthma Daughter   ? Cancer Paternal Aunt   ? Asthma Maternal Grandmother   ?  ?Past Surgical History:  ?Procedure Laterality Date  ? CARDIOVERSION N/A 05/30/2017  ? Procedure: CARDIOVERSION;  Surgeon: Sanda Klein, MD;  Location: [facility];  Service: Cardiovascular;  Laterality: N/A;  ? REDUCTION MAMMAPLASTY Bilateral 09/2011  ? TUBAL LIGATION  1996  ? bilateral  ? ?Social History  ? ?Occupational History  ? Occupation: stay at home  ?Tobacco Use  ? Smoking status: Former  ?  Packs/day: 0.50  ?  Years: 26.00  ?  Pack years: 13.00  ?  Types: Cigarettes  ?  Quit date: 09/12/2014  ?  Years since quitting: 6.8  ? Smokeless tobacco: Never  ?Vaping Use  ? Vaping Use: Never used  ?Substance and Sexual Activity  ? Alcohol use: No  ? Drug use: No  ? Sexual activity: Never  ? ? ? ? ? ? ?

## 2021-07-05 ENCOUNTER — Ambulatory Visit (INDEPENDENT_AMBULATORY_CARE_PROVIDER_SITE_OTHER): Payer: Medicare Other

## 2021-07-05 VITALS — BP 137/85 | HR 79 | Temp 97.9°F | Resp 18 | Ht 66.0 in | Wt 326.0 lb

## 2021-07-05 DIAGNOSIS — J4552 Severe persistent asthma with status asthmaticus: Secondary | ICD-10-CM

## 2021-07-05 MED ORDER — FAMOTIDINE IN NACL 20-0.9 MG/50ML-% IV SOLN: 20.0000 mg | Freq: Once | INTRAVENOUS | Status: DC | PRN

## 2021-07-05 MED ORDER — TEZEPELUMAB-EKKO 210 MG/1.91ML ~~LOC~~ SOSY
210.0000 mg | PREFILLED_SYRINGE | Freq: Once | SUBCUTANEOUS | Status: AC
  Administered 2021-07-05: 15:00:00 210 mg via SUBCUTANEOUS
  Filled 2021-07-05: qty 1.91

## 2021-07-05 MED ORDER — METHYLPREDNISOLONE SODIUM SUCC 125 MG IJ SOLR: 125.0000 mg | Freq: Once | INTRAMUSCULAR | Status: DC | PRN

## 2021-07-05 MED ORDER — SODIUM CHLORIDE 0.9 % IV SOLN: Freq: Once | INTRAVENOUS | Status: DC | PRN

## 2021-07-05 MED ORDER — EPINEPHRINE 0.3 MG/0.3ML IJ SOAJ: 0.3000 mg | Freq: Once | INTRAMUSCULAR | Status: DC | PRN

## 2021-07-05 MED ORDER — ALBUTEROL SULFATE HFA 108 (90 BASE) MCG/ACT IN AERS: 2.0000 | INHALATION_SPRAY | Freq: Once | RESPIRATORY_TRACT | Status: DC | PRN

## 2021-07-05 MED ORDER — DIPHENHYDRAMINE HCL 50 MG/ML IJ SOLN: 50.0000 mg | Freq: Once | INTRAMUSCULAR | Status: DC | PRN

## 2021-07-05 NOTE — Progress Notes (Signed)
Diagnosis: Asthma ? ?Provider:  Praveen Mannam, MD ? ?Procedure: Injection ? ?Tezepelumab (Tezspire), Dose: 210 mg , Site: subcutaneous, Number of injections: 1 ? ?Discharge: Condition: Good, Destination: Home . AVS provided to patient.  ? ?Performed by:  Lonzo Saulter A Sindy Mccune, RN  ? ? ? ?  ?

## 2021-07-24 ENCOUNTER — Telehealth: Payer: Self-pay | Admitting: Internal Medicine

## 2021-07-25 MED ORDER — ZOLPIDEM TARTRATE 10 MG PO TABS: ORAL_TABLET | ORAL | 5 refills | Status: DC

## 2021-07-25 MED ORDER — ZOLPIDEM TARTRATE 10 MG PO TABS
ORAL_TABLET | ORAL | 5 refills | Status: DC
Start: 2021-07-25 — End: 2021-07-25

## 2021-07-25 NOTE — Telephone Encounter (Signed)
Spoke to patient. ?She is requesting refill on Ambien '10mg'$ . Last refilled 01/26/21 #30.  ?Last OV 02/15/21. ? ?Dr. Annamaria Boots, please advise. Thanks ?

## 2021-07-25 NOTE — Telephone Encounter (Signed)
Patient is aware of below message and voiced her understanding.  Nothing further needed.   

## 2021-07-25 NOTE — Telephone Encounter (Signed)
Ambien refilled

## 2021-07-30 NOTE — Progress Notes (Signed)
? Patient ID: April [name], female    DOB: [birth year], 51 y.o.   MRN: [id number] ? ? ?HPI ?F former smoker followed for chronic severe asthma/ Xolair complicated by non-compliance,  OSA/ CPAP,  morbid obesity, HBP, depression, GERD, AFib/ Eliquis ? ?NPSG 03/31/07- AHI 5.8/ hr, weight 330 lbs. She canceled planned more recent study. ?Office spirometry 10/24/14- severe obstructive airways disease. FVC 2.10/66%, FEV1 1.25/47%, FEV1/FVC 0.59, FEF 25-75 percent 0.64/19% ?Unattended HST 07/06/16- AHI 11/hour, desaturation to 74%. ?Office Spirometry 07/29/2016-moderately severe obstructive airways disease. FVC 2.21/68%, FEV1 1.53/57%, ratio 0.69, FEF 25-75% 0.98/34% ?Prolonged hosp [facility] 4/19- tracheostomy. ?Dupixent enrolled 10/22/2018 ?HST 12/26/2018- AHI 9.7/ hr, desaturation to 9.7/ hr, desaturation to 69% ?------------------------------------------------------- ? ? ? ?02/15/21- 51 year old female Smoker followed for chronic severe asthma/Xolair, Tracheostomy at [facility] 2019, Insomnia,  complicated by noncompliance, OSA/ oral appliance, morbid obesity, HTN, depression, GERD Covid infection, PAFib, dCHF, Dyslipidemia,  ?Dupixent enrolled 10/22/2018-- every 2 weeks ?-Proair hfa, Trelegy  100, Tezspire, [place], [employer] 10, [place],  ?Hosp f/u dc'd 10/12- COPD exacerbation, Acute Resp Failure Hypoxia> Zith, pred taper,  Noted desaturation at night, rec updated sleep study ?On prednisone taper, still feeling activel wheeze. Not using neb. Denies knowing why she has has status asthma over past month. Denies smoking but asks patchess, saying she still 'get the urge". Discussed pharmacy smoking program.  ?Daughter and grandkids living with her> stress.  ? ?CXR 02/06/21 1V ?FINDINGS: ?The cardiac silhouette, mediastinal and hilar contours are within ?normal limits. ?The lungs are clear. No infiltrates, edema or effusions. ?The bony thorax is intact. ?IMPRESSION: ?No acute cardiopulmonary findings. ? ?07/31/21- 51 year old  female Smoker followed for chronic severe asthma/Xolair, Tracheostomy at [facility] 2019, Insomnia,  complicated by noncompliance, OSA/ oral appliance, morbid obesity, HTN, depression, GERD Covid infection, PAFib, dCHF, Dyslipidemia,  ?Dupixent enrolled 10/22/2018-- every 2 weeks ?-Proair hfa, Trelegy  100, Singulair, Neb Duoneb, Dupixent, Ambien 10, Vyvanse, Tezspire, ?Hosp 1/24-1/28- Asthma exacerb ? Viral>  ?Says she thinks rrelegy and Dupixent help. Took some prednisone she had at home for a rough patch this week. Wants to try Combivent as a rescue inhaler. Trying to avoid smoke exposure, although some of her family smoke around her.  ?CTachest 05/23/21- ?IMPRESSION: ?1. No pulmonary emboli or acute abnormality. ?2. Mild bilateral centrilobular and paraseptal bullous emphysema. ?3. Mild atheromatous calcifications involving the left anterior ?descending coronary artery. ?4. Progressive changes of discogenic sclerosis involving the C6, C7 ?and T1 vertebrae. ? ?Review of Systems-See HPI  + = positive ?Constitutional:   + weight loss, night sweats, fevers, chills, +fatigue, lassitude. ?HEENT:   No-  headaches, difficulty swallowing, tooth/dental problems, sore throat,  ?  sneezing, itching, ear ache, +nasal congestion, post nasal drip,  ?CV:  No-   chest pain, orthopnea, PND, swelling in lower extremities, anasarca, dizziness, palpitations ?Resp: +shortness of breath with exertion or at rest.   ?           productive cough,  + non-productive cough,  No- coughing up of blood.   ?           No-   change in color of mucus. +wheezing.   ?Skin: No-   rash or lesions. ?GI:  No-   heartburn, indigestion, abdominal pain, nausea, vomiting,  ?GU: ?MS:  No-   joint pain or swelling.   ?Neuro-     nothing unusual ?Psych:  No- change in mood or affect. No depression or anxiety.  No memory loss. ?   ?Objective:  ?  Physical Exam    ?General- Alert, Oriented, Affect-appropriate, Distress- none acute, + morbid  obesity,  ?Skin- rash-none,  lesions- none, excoriation- none ?Lymphadenopathy- none ?Head- atraumatic ?           Eyes- Gross vision intact, PERRLA, conjunctivae clear secretions ?           Ears- Hearing, canals-normal ?           Nose- Clear, no-Septal dev, mucus, polyps, erosion, perforation. ?           Throat- Mallampati III , mucosa clear , drainage- none, tonsils- atrophic,   ?                        + Missing  teeth, +upper denture plate is out      ?            Neck- flexible , trachea+ stoma scar, no stridor , thyroid nl, carotid no bruit ?Chest - symmetrical excursion , unlabored ?          Heart/CV- RRR , no murmur , no gallop  , no rub, nl s1 s2 ?                          - JVD- none , edema- none, stasis changes- none, varices- none ?          Lung- +Diminished, . Wheeze -none, cough+minimal,  dullness-none,  rub- none  ?          Chest wall ?Abd-  ?Br/ Gen/ Rectal- Not done, not indicated ?Extrem- cyanosis- none, clubbing, none, atrophy- none, strength- nl ?Neuro- grossly intact to observation ? ? ? ?  ? ? ? ? ?

## 2021-07-31 ENCOUNTER — Encounter: Payer: Self-pay | Admitting: Internal Medicine

## 2021-07-31 ENCOUNTER — Ambulatory Visit (INDEPENDENT_AMBULATORY_CARE_PROVIDER_SITE_OTHER): Payer: Medicare Other | Admitting: Internal Medicine

## 2021-07-31 VITALS — BP 122/74 | HR 77 | Temp 98.2°F | Ht 66.0 in | Wt 321.0 lb

## 2021-07-31 DIAGNOSIS — J449 Chronic obstructive pulmonary disease, unspecified: Secondary | ICD-10-CM

## 2021-07-31 DIAGNOSIS — J4541 Moderate persistent asthma with (acute) exacerbation: Secondary | ICD-10-CM | POA: Diagnosis not present

## 2021-07-31 DIAGNOSIS — I5032 Chronic diastolic (congestive) heart failure: Secondary | ICD-10-CM | POA: Diagnosis not present

## 2021-07-31 MED ORDER — PREDNISONE 10 MG PO TABS: ORAL_TABLET | ORAL | 0 refills | Status: DC

## 2021-07-31 MED ORDER — IPRATROPIUM-ALBUTEROL 20-100 MCG/ACT IN AERS: INHALATION_SPRAY | RESPIRATORY_TRACT | 12 refills | Status: DC

## 2021-07-31 NOTE — Patient Instructions (Addendum)
We sent script for Combivent (ipratropium-albuterol) rescue inhaler    try inhaling 1 puff every 6 hours, IF Needed,  instead of your albuterol (ventolin, proair or proventil) rescue inhaler. See if it works better. ? ?Script sent for prednisone to hold in case needed ? ?Please call if we can help ?

## 2021-08-02 ENCOUNTER — Ambulatory Visit (INDEPENDENT_AMBULATORY_CARE_PROVIDER_SITE_OTHER): Payer: Medicare Other

## 2021-08-02 ENCOUNTER — Telehealth: Payer: Self-pay | Admitting: Internal Medicine

## 2021-08-02 VITALS — BP 136/74 | HR 81 | Temp 99.2°F | Resp 22 | Ht 66.0 in | Wt 326.6 lb

## 2021-08-02 DIAGNOSIS — J4552 Severe persistent asthma with status asthmaticus: Secondary | ICD-10-CM

## 2021-08-02 MED ORDER — TEZEPELUMAB-EKKO 210 MG/1.91ML ~~LOC~~ SOSY
210.0000 mg | PREFILLED_SYRINGE | Freq: Once | SUBCUTANEOUS | Status: AC
  Administered 2021-08-02: 210 mg via SUBCUTANEOUS
  Filled 2021-08-02: qty 1.91

## 2021-08-02 NOTE — Progress Notes (Signed)
Diagnosis: Asthma ? ?Provider:  Marshell Garfinkel, MD ? ?Procedure: Injection ? ?Tezepelumab (Tezspire), Dose: 210 mg , Site: subcutaneous, Number of injections: 1 ? ?Discharge: Condition: Good, Destination: Home . AVS provided to patient.  ? ?Performed by:  Dare Spillman, RN  ? ? ? ?  ?

## 2021-08-03 NOTE — Telephone Encounter (Signed)
Pt calling back stating she has no refills on medication.  [phone].   ?

## 2021-08-03 NOTE — Telephone Encounter (Signed)
Pt returning call.  [phone]. ?

## 2021-08-03 NOTE — Telephone Encounter (Signed)
Patient is calling and asking for a prescription for Clonazepam. She states that was was on it for depression and that her depression is coming back. I don't see this on the patient's current med list. ? ?Dr. Annamaria Boots please advise ?

## 2021-08-03 NOTE — Telephone Encounter (Signed)
Called and left voicemail for patient to call office back  

## 2021-08-04 NOTE — Telephone Encounter (Signed)
We had prescribed clonazepam a year or so ago to treat insomnia.  ?I don't treat depression. Please ask her to discuss this problem with her primary care doctor or psychiatrist. ?

## 2021-08-05 ENCOUNTER — Other Ambulatory Visit: Payer: Self-pay | Admitting: Family Medicine

## 2021-08-06 NOTE — Telephone Encounter (Signed)
Attempted to call pt but unable to reach. Left message for her to return call. 

## 2021-08-06 NOTE — Telephone Encounter (Signed)
Patient is returning phone call. Patient phone number is [phone]. ?

## 2021-08-06 NOTE — Telephone Encounter (Signed)
Called and spoke with pt letting her know the info per CY about the clonazepam and she verbalized understanding. Nothing further needed. ?

## 2021-08-19 ENCOUNTER — Telehealth: Payer: Self-pay | Admitting: Internal Medicine

## 2021-08-19 NOTE — Telephone Encounter (Signed)
Got weekend call from [name] ? ? ?S: ?Saw Dr Annamaria Boots for asthma 07/31/21. On daily prednisone '10mg'$  per day her hx. Has OSA and prior hx of trach. Not suing cPAP. Asthma acting up last 2 days. She wants to avoid ER . She has doubled  up prednisone to '20mg'$  oper day. Increased wheezing "a whole lot" . Does not feel she needs to go to ER but could end up there. No fever., STill smoking. No sputum. She wants more prednisone. Last night used albuterol a few times which is double of normal ? ?She called yesterday but I missed the page. Apologies ? ?Plan ?- 9 day prednisone burst and then to baseline as below ? - Take prednisone 40 mg daily x 3 days, then prednisone '30mg'$  daily x 3 days, then '20mg'$  daily x 3 days, then '10mg'$  daily baseline ? - [facility] - called in 10:43 - 10.45AM 08/19/2021 ? ? ?- Go to ER if worse - she knows to recognize this ? ? ? ?  ? ?SIGNATURE  ? ? ?Dr. Brand Males, M.D., F.C.C.P,  ?Pulmonary and Critical Care Medicine ?Staff Physician, [place] ?Center Director - Interstitial Lung Disease  Program  ?Medical Director - [place] [facility] ?Pulmonary [place] [facility] ?[place], [place], [address] ? ?NPI Number:  NPI #[id number] ?DEA Number: [id number] ? ?Pager: [phone], If no answer  -> Check AMION or Try [phone] ?Telephone (clinical office): [phone] ?Telephone (research): [phone] ? ?10:33 AM ?08/19/2021 ? ? ? ? ?

## 2021-08-23 ENCOUNTER — Encounter (HOSPITAL_COMMUNITY): Payer: Self-pay | Admitting: Emergency Medicine

## 2021-08-23 ENCOUNTER — Observation Stay (HOSPITAL_COMMUNITY)
Admission: EM | Admit: 2021-08-23 | Discharge: 2021-08-24 | Discharge status: Against Medical Advice | Payer: Medicare Other | Attending: Internal Medicine | Admitting: Internal Medicine

## 2021-08-23 ENCOUNTER — Emergency Department (HOSPITAL_COMMUNITY): Payer: Medicare Other

## 2021-08-23 ENCOUNTER — Other Ambulatory Visit: Payer: Self-pay

## 2021-08-23 DIAGNOSIS — Z794 Long term (current) use of insulin: Secondary | ICD-10-CM | POA: Diagnosis not present

## 2021-08-23 DIAGNOSIS — E039 Hypothyroidism, unspecified: Secondary | ICD-10-CM | POA: Diagnosis not present

## 2021-08-23 DIAGNOSIS — F419 Anxiety disorder, unspecified: Secondary | ICD-10-CM | POA: Diagnosis present

## 2021-08-23 DIAGNOSIS — Z87891 Personal history of nicotine dependence: Secondary | ICD-10-CM | POA: Insufficient documentation

## 2021-08-23 DIAGNOSIS — J4551 Severe persistent asthma with (acute) exacerbation: Secondary | ICD-10-CM | POA: Diagnosis not present

## 2021-08-23 DIAGNOSIS — D72829 Elevated white blood cell count, unspecified: Secondary | ICD-10-CM | POA: Diagnosis present

## 2021-08-23 DIAGNOSIS — E785 Hyperlipidemia, unspecified: Secondary | ICD-10-CM | POA: Diagnosis present

## 2021-08-23 DIAGNOSIS — I110 Hypertensive heart disease with heart failure: Secondary | ICD-10-CM | POA: Diagnosis not present

## 2021-08-23 DIAGNOSIS — I10 Essential (primary) hypertension: Secondary | ICD-10-CM | POA: Diagnosis present

## 2021-08-23 DIAGNOSIS — Z20822 Contact with and (suspected) exposure to covid-19: Secondary | ICD-10-CM | POA: Diagnosis not present

## 2021-08-23 DIAGNOSIS — I480 Paroxysmal atrial fibrillation: Secondary | ICD-10-CM | POA: Diagnosis present

## 2021-08-23 DIAGNOSIS — R7303 Prediabetes: Secondary | ICD-10-CM | POA: Diagnosis not present

## 2021-08-23 DIAGNOSIS — Z7901 Long term (current) use of anticoagulants: Secondary | ICD-10-CM | POA: Diagnosis not present

## 2021-08-23 DIAGNOSIS — Z79899 Other long term (current) drug therapy: Secondary | ICD-10-CM | POA: Diagnosis not present

## 2021-08-23 DIAGNOSIS — J449 Chronic obstructive pulmonary disease, unspecified: Secondary | ICD-10-CM | POA: Diagnosis not present

## 2021-08-23 DIAGNOSIS — I5032 Chronic diastolic (congestive) heart failure: Secondary | ICD-10-CM | POA: Diagnosis not present

## 2021-08-23 DIAGNOSIS — F32A Depression, unspecified: Secondary | ICD-10-CM | POA: Diagnosis present

## 2021-08-23 DIAGNOSIS — G4733 Obstructive sleep apnea (adult) (pediatric): Secondary | ICD-10-CM | POA: Diagnosis present

## 2021-08-23 DIAGNOSIS — R0602 Shortness of breath: Secondary | ICD-10-CM | POA: Diagnosis present

## 2021-08-23 LAB — CBC WITH DIFFERENTIAL/PLATELET
Abs Immature Granulocytes: 0.14 10*3/uL — ABNORMAL HIGH (ref 0.00–0.07)
Basophils Absolute: 0 10*3/uL (ref 0.0–0.1)
Basophils Relative: 0 %
Eosinophils Absolute: 0 10*3/uL (ref 0.0–0.5)
Eosinophils Relative: 0 %
HCT: 37.8 % (ref 36.0–46.0)
Hemoglobin: 12 g/dL (ref 12.0–15.0)
Immature Granulocytes: 1 %
Lymphocytes Relative: 8 %
Lymphs Abs: 1.4 10*3/uL (ref 0.7–4.0)
MCH: 25.8 pg — ABNORMAL LOW (ref 26.0–34.0)
MCHC: 31.7 g/dL (ref 30.0–36.0)
MCV: 81.1 fL (ref 80.0–100.0)
Monocytes Absolute: 0.2 10*3/uL (ref 0.1–1.0)
Monocytes Relative: 1 %
Neutro Abs: 16.2 10*3/uL — ABNORMAL HIGH (ref 1.7–7.7)
Neutrophils Relative %: 90 %
Platelets: 420 10*3/uL — ABNORMAL HIGH (ref 150–400)
RBC: 4.66 MIL/uL (ref 3.87–5.11)
RDW: 18.9 % — ABNORMAL HIGH (ref 11.5–15.5)
WBC: 17.9 10*3/uL — ABNORMAL HIGH (ref 4.0–10.5)
nRBC: 0 % (ref 0.0–0.2)

## 2021-08-23 LAB — COMPREHENSIVE METABOLIC PANEL
ALT: 11 U/L (ref 0–44)
AST: 16 U/L (ref 15–41)
Albumin: 3.3 g/dL — ABNORMAL LOW (ref 3.5–5.0)
Alkaline Phosphatase: 68 U/L (ref 38–126)
Anion gap: 6 (ref 5–15)
BUN: 8 mg/dL (ref 6–20)
CO2: 27 mmol/L (ref 22–32)
Calcium: 8.7 mg/dL — ABNORMAL LOW (ref 8.9–10.3)
Chloride: 103 mmol/L (ref 98–111)
Creatinine, Ser: 0.73 mg/dL (ref 0.44–1.00)
GFR, Estimated: >60 mL/min (ref >60)
Glucose, Bld: 122 mg/dL — ABNORMAL HIGH (ref 70–99)
Potassium: 4.2 mmol/L (ref 3.5–5.1)
Sodium: 136 mmol/L (ref 135–145)
Total Bilirubin: 0.3 mg/dL (ref 0.3–1.2)
Total Protein: 7.1 g/dL (ref 6.5–8.1)

## 2021-08-23 LAB — BLOOD GAS, VENOUS
Acid-Base Excess: 4.7 mmol/L — ABNORMAL HIGH (ref 0.0–2.0)
Bicarbonate: 29.2 mmol/L — ABNORMAL HIGH (ref 20.0–28.0)
O2 Saturation: 73.7 %
Patient temperature: 37
pCO2, Ven: 42 mmHg — ABNORMAL LOW (ref 44–60)
pH, Ven: 7.45 — ABNORMAL HIGH (ref 7.25–7.43)
pO2, Ven: 38 mmHg (ref 32–45)

## 2021-08-23 LAB — RESP PANEL BY RT-PCR (FLU A&B, COVID) ARPGX2
Influenza A by PCR: NEGATIVE
Influenza B by PCR: NEGATIVE
SARS Coronavirus 2 by RT PCR: NEGATIVE

## 2021-08-23 MED ORDER — ONDANSETRON HCL 4 MG PO TABS: 4.0000 mg | ORAL_TABLET | Freq: Four times a day (QID) | ORAL | Status: DC | PRN

## 2021-08-23 MED ORDER — MAGNESIUM SULFATE 2 GM/50ML IV SOLN
2.0000 g | Freq: Once | INTRAVENOUS | Status: AC
Start: 2021-08-23 — End: 2021-08-23
  Administered 2021-08-23: 2 g via INTRAVENOUS
  Filled 2021-08-23: qty 50

## 2021-08-23 MED ORDER — SODIUM CHLORIDE 0.9% FLUSH
3.0000 mL | Freq: Two times a day (BID) | INTRAVENOUS | Status: DC
  Administered 2021-08-24: 3 mL via INTRAVENOUS

## 2021-08-23 MED ORDER — HYDRALAZINE HCL 20 MG/ML IJ SOLN
10.0000 mg | Freq: Four times a day (QID) | INTRAMUSCULAR | Status: DC | PRN
  Administered 2021-08-24: 10 mg via INTRAVENOUS
  Filled 2021-08-23: qty 1

## 2021-08-23 MED ORDER — ACETAMINOPHEN 650 MG RE SUPP: 650.0000 mg | Freq: Four times a day (QID) | RECTAL | Status: DC | PRN

## 2021-08-23 MED ORDER — ALBUTEROL SULFATE (2.5 MG/3ML) 0.083% IN NEBU: 2.5000 mg | INHALATION_SOLUTION | RESPIRATORY_TRACT | Status: DC | PRN

## 2021-08-23 MED ORDER — SENNOSIDES-DOCUSATE SODIUM 8.6-50 MG PO TABS: 1.0000 | ORAL_TABLET | Freq: Every evening | ORAL | Status: DC | PRN

## 2021-08-23 MED ORDER — IPRATROPIUM-ALBUTEROL 0.5-2.5 (3) MG/3ML IN SOLN
3.0000 mL | Freq: Four times a day (QID) | RESPIRATORY_TRACT | Status: DC
  Administered 2021-08-24: 3 mL via RESPIRATORY_TRACT
  Filled 2021-08-23: qty 3

## 2021-08-23 MED ORDER — ALBUTEROL SULFATE (2.5 MG/3ML) 0.083% IN NEBU
10.0000 mg/h | INHALATION_SOLUTION | Freq: Once | RESPIRATORY_TRACT | Status: AC
  Administered 2021-08-23: 10 mg/h via RESPIRATORY_TRACT
  Filled 2021-08-23: qty 12

## 2021-08-23 MED ORDER — METHYLPREDNISOLONE SODIUM SUCC 40 MG IJ SOLR: 40.0000 mg | Freq: Two times a day (BID) | INTRAMUSCULAR | Status: DC

## 2021-08-23 MED ORDER — ACETAMINOPHEN 325 MG PO TABS: 650.0000 mg | ORAL_TABLET | Freq: Four times a day (QID) | ORAL | Status: DC | PRN

## 2021-08-23 MED ORDER — ONDANSETRON HCL 4 MG/2ML IJ SOLN: 4.0000 mg | Freq: Four times a day (QID) | INTRAMUSCULAR | Status: DC | PRN

## 2021-08-23 MED ORDER — ALBUTEROL SULFATE HFA 108 (90 BASE) MCG/ACT IN AERS: 2.0000 | INHALATION_SPRAY | RESPIRATORY_TRACT | Status: DC | PRN

## 2021-08-23 MED ORDER — METHYLPREDNISOLONE SODIUM SUCC 125 MG IJ SOLR
125.0000 mg | Freq: Once | INTRAMUSCULAR | Status: AC
  Administered 2021-08-23: 125 mg via INTRAVENOUS
  Filled 2021-08-23: qty 2

## 2021-08-23 MED ORDER — ALBUTEROL SULFATE (2.5 MG/3ML) 0.083% IN NEBU
10.0000 mg/h | INHALATION_SOLUTION | Freq: Once | RESPIRATORY_TRACT | Status: AC
  Administered 2021-08-23: 2.5 mg/h via RESPIRATORY_TRACT
  Filled 2021-08-23: qty 3

## 2021-08-23 NOTE — Assessment & Plan Note (Signed)
Significant hypertension on admission.  Has not had PM meds yet. ?-Continue home clonidine 0.2 mg twice daily, Coreg 6.25 mg twice daily, amlodipine 10 mg daily, Lasix 40 mg daily ?-IV hydralazine as needed ?

## 2021-08-23 NOTE — ED Triage Notes (Signed)
Pt reports asthma flare up x1 week. Pt reports home inhalers have not been working for her  ?

## 2021-08-23 NOTE — ED Notes (Signed)
Patient ambulated to the restroom with minimal assistance 

## 2021-08-23 NOTE — Assessment & Plan Note (Signed)
- 

## 2021-08-23 NOTE — ED Notes (Signed)
Respiratory called for continuous neb 

## 2021-08-23 NOTE — H&P (Signed)
?History and Physical  ? ? ?ANASOFIA MICALLEF TOI:[id number] DOB: 12-05-1970 DOA: 08/23/2021 ? ?PCP: [name], MD  ?Patient coming from: Home ? ?I have personally briefly reviewed patient's old medical records in [place] ? ?Chief Complaint: Shortness of breath ? ?HPI: ?JONAYA FRESHOUR is a 51 y.o. female with medical history significant for severe persistent asthma, paroxysmal atrial fibrillation on Eliquis, HFpEF (EF 65-70% by TTE 05/25/2021), HTN, HLD, hypothyroidism, depression/anxiety, OSA nonadherent to CPAP, morbid obesity who presented to the ED for evaluation of shortness of breath. ? ?Patient reports asthma flaring up 5 days ago.  She has been having progressive dyspnea with occasional nonproductive cough.  Patient initially called her pulmonology office on 08/19/2021.  Per documentation, she has been on prednisone 10 mg daily chronically.  She doubled up her prednisone without significant improvement.  She was prescribed a 9-day prednisone burst which she has been taking in addition to her usual inhalers/nebulizers without significant relief. ? ?Due to the continued dyspnea she presented to the ED for further evaluation.  She states that she thinks her inhalers are running out.  She states that she is not using her her CPAP regularly.  She denies any nausea, vomiting, chest pain, abdominal pain, peripheral edema. ? ?ED Course  Labs/Imaging on admission: I have personally reviewed following labs and imaging studies. ? ?Initial vitals showed BP 204/100, pulse 90, RR 26, temp 97.9 ?F, SPO2 90% on room air. ? ?Labs show WBC 17.9, hemoglobin 12.0, platelets 420,000, sodium 136, potassium 4.2, bicarb 27, BUN 8, creatinine 0.73, serum glucose 122, LFTs within normal limits.  SARS-CoV-2 and influenza PCR negative. ? ?2 view chest x-ray negative for focal consolidation, edema, effusion, pneumothorax. ? ?Patient was given IV Solu-Medrol 125 mg, IV magnesium 2 g, continuous albuterol nebulizer x3.  The  hospitalist service was consulted to admit for further evaluation and management. ? ?Review of Systems: All systems reviewed and are negative except as documented in history of present illness above. ? ? ?Past Medical History:  ?Diagnosis Date  ? Acanthosis nigricans   ? Anxiety   ? Arthritis   ? "knees" (04/28/2017)  ? Asthma   ? Followed by Dr. Annamaria Boots (pulmonology); receives every other week omalizumab injections; has frequent exacerbations  ? Back pain   ? Chronic diastolic CHF (congestive heart failure) ([place]) 01/17/2017  ? COPD (chronic obstructive pulmonary disease) ([place])   ? PFTs in 2002, FEV1/FVC 65, no post bronchodilater test done  ? Depression   ? GERD (gastroesophageal reflux disease)   ? Headache(784.0)   ? "q couple days" (04/28/2017)  ? Helicobacter pylori (H. pylori) infection   ? Hypertension, essential   ? Insomnia   ? Joint pain   ? Lower extremity edema   ? Menorrhagia   ? Morbid obesity ([place])   ? OSA on CPAP   ? Sleep study 2008 - mild OSA, not enough events to titrate CPAP; wears CPAP now/pt on 04/28/2017  ? Pneumonia X 1  ? Prediabetes   ? Rheumatoid arthritis ([place])   ? Seasonal allergies   ? Shortness of breath   ? Tobacco user   ? Vitamin D deficiency   ? ? ?Past Surgical History:  ?Procedure Laterality Date  ? CARDIOVERSION N/A 05/30/2017  ? Procedure: CARDIOVERSION;  Surgeon: Sanda Klein, MD;  Location: [facility];  Service: Cardiovascular;  Laterality: N/A;  ? REDUCTION MAMMAPLASTY Bilateral 09/2011  ? TUBAL LIGATION  1996  ? bilateral  ? ? ?Social History: ? reports  that she quit smoking about 6 years ago. Her smoking use included cigarettes. She has a 13.00 pack-year smoking history. She has never used smokeless tobacco. She reports that she does not drink alcohol and does not use drugs. ? ?Allergies  ?Allergen Reactions  ? Contrast Media [Iodinated Contrast Media] Itching  ?  Ct contrast  ? ? ?Family History  ?Problem Relation Age of Onset  ? Hypertension Mother   ? Asthma Daughter    ? Cancer Paternal Aunt   ? Asthma Maternal Grandmother   ? ? ? ?Prior to Admission medications   ?Medication Sig Start Date End Date Taking? Authorizing Provider  ?albuterol (PROAIR HFA) 108 (90 Base) MCG/ACT inhaler Inhale 2 puffs into the lungs every 6 (six) hours as needed for wheezing or shortness of breath. 06/26/21   Baird Lyons D, MD  ?amLODipine (NORVASC) 10 MG tablet Take 1 tablet (10 mg total) by mouth daily. 06/06/21   [name], MD  ?apixaban (ELIQUIS) 5 MG TABS tablet Take 1 tablet (5 mg total) by mouth 2 (two) times daily. 06/06/21   [name], MD  ?atorvastatin (LIPITOR) 20 MG tablet Take 20 mg by mouth every morning. 10/02/20   [provider]  ?baclofen (LIORESAL) 20 MG tablet Take 20 mg by mouth daily as needed for muscle spasms (pain). 01/08/21   [provider]  ?carvedilol (COREG) 6.25 MG tablet Take 1 tablet (6.25 mg total) by mouth 2 (two) times daily with a meal. 06/06/21   [name], MD  ?cloNIDine (CATAPRES) 0.2 MG tablet Take 1 tablet (0.2 mg total) by mouth 2 (two) times daily. 05/26/21 08/24/21  Terrilee Croak, MD  ?DULoxetine (CYMBALTA) 60 MG capsule Take 1 capsule (60 mg total) by mouth daily. 06/06/21   [name], MD  ?EPINEPHrine 0.3 mg/0.3 mL IJ SOAJ injection Inject 0.3 mg into the muscle as needed for anaphylaxis. ?Patient taking differently: Inject 0.3 mg into the muscle once as needed for anaphylaxis (asthma). 05/30/20   Baird Lyons D, MD  ?fluticasone (FLONASE) 50 MCG/ACT nasal spray SHAKE LIQUID AND USE 1 SPRAY IN EACH NOSTRIL DAILY AS NEEDED FOR ALLERGIES OR RHINITIS 08/05/21   [name], MD  ?Fluticasone-Umeclidin-Vilant (TRELEGY ELLIPTA) 100-62.5-25 MCG/INH AEPB Inhale 1 puff into the lungs daily. 01/23/21   Baird Lyons D, MD  ?furosemide (LASIX) 40 MG tablet Take 1 tablet (40 mg total) by mouth daily. 02/23/20   [name], MD  ?Ipratropium-Albuterol (COMBIVENT) 20-100 MCG/ACT AERS respimat Inhale 1 puff, every 6 hours as needed,  rescue 07/31/21   Baird Lyons D, MD  ?ipratropium-albuterol (DUONEB) 0.5-2.5 (3) MG/3ML SOLN Take 3 mLs by nebulization every 6 (six) hours as needed (shortness of breath/wheezing). 02/15/21   Baird Lyons D, MD  ?levothyroxine (SYNTHROID) 50 MCG tablet Take 50 mcg by mouth daily. 10/15/20   [provider]  ?montelukast (SINGULAIR) 10 MG tablet TAKE 1 TABLET BY MOUTH AT BEDTIME. OFFICE VISIT NEEDED FOR ADDITIONAL REFILLS ?Patient taking differently: Take 10 mg by mouth at bedtime. 02/27/21   [name], MD  ?naloxone [facility]) nasal spray 4 mg/0.1 mL Place 1 spray into the nose once as needed (opioid overdose). 08/09/20   [provider]  ?omeprazole (PRILOSEC) 20 MG capsule Take 20 mg by mouth daily as needed (heartburn/indigestion).    [provider]  ?potassium chloride SA (KLOR-CON M) 20 MEQ tablet Take 1 tablet (20 mEq total) by mouth daily. 06/06/21   [name], MD  ?predniSONE (DELTASONE) 10 MG tablet 1-2 tabs daily  only when needed 07/31/21   Baird Lyons D, MD  ?pregabalin (LYRICA) 300 MG capsule Take 300 mg by mouth 2 (two) times daily as needed (pain). 01/08/21   [provider]  ?Semaglutide, 1 MG/DOSE, (OZEMPIC, 1 MG/DOSE,) 4 MG/3ML SOPN Inject 1 mg into the skin every Monday.    [provider]  ?tezepelumab-ekko (TEZSPIRE) 210 MG/1.91ML syringe Inject 210 mg into the skin every 28 (twenty-eight) days. 04/12/21   [provider]  ?topiramate (TOPAMAX) 200 MG tablet Take 200 mg by mouth at bedtime. 01/08/21   [provider]  ?VYVANSE 70 MG capsule Take 70 mg by mouth once a week. 01/20/19   [provider]  ?zolpidem (AMBIEN) 10 MG tablet TAKE 1 TABLET BY MOUTH AT BEDTIME AS NEEDED FOR SLEEP 07/25/21   Young, Tarri Fuller D, MD  ?mometasone-formoterol [facility]) 200-5 MCG/ACT AERO Inhale 2 puffs into the lungs 2 (two) times daily. Dx: Asthma 04/08/19 08/12/19  Deneise Lever, MD  ? ? ?Physical Exam: ?Vitals:  ? 08/23/21 2052  08/23/21 2057 08/23/21 2230 08/23/21 2245  ?BP: (!) 157/123 (!) 172/131 (!) 192/99 (!) 198/102  ?Pulse:  (!) 102 (!) 101 (!) 105  ?Resp: 20 (!) 21 16 (!) 26  ?Temp:      ?TempSrc:      ?SpO2:  100% 99% 100%  ? ?Constit

## 2021-08-23 NOTE — Assessment & Plan Note (Signed)
Continue atorvastatin

## 2021-08-23 NOTE — Assessment & Plan Note (Signed)
Continue home Cymbalta, Vyvanse. ?

## 2021-08-23 NOTE — Hospital Course (Signed)
April [name] is a 51 y.o. female with medical history significant for severe persistent asthma, paroxysmal atrial fibrillation on Eliquis, HFpEF (EF 65-70% by TTE 05/25/2021), HTN, HLD, hypothyroidism, depression/anxiety, OSA nonadherent to CPAP, morbid obesity who is admitted with acute asthma exacerbation. ?

## 2021-08-23 NOTE — Assessment & Plan Note (Signed)
Euvolemic on admission.  Continue home Lasix 40 mg daily. ?

## 2021-08-23 NOTE — Assessment & Plan Note (Signed)
Admitted with asthma exacerbation failing outpatient management.  Still with some wheezing on admission despite 3 continuous albuterol nebulizer treatments.  Not requiring supplemental oxygen while at rest. ?-Continue IV Solu-Medrol 40 mg twice daily ?-Start scheduled DuoNebs with albuterol as needed ?-Continue home Trelegy, Singulair ?-Supplemental oxygen if needed ?

## 2021-08-23 NOTE — Assessment & Plan Note (Signed)
Chronic leukocytosis likely related to steroid use.  No evidence of active infection at this time. ?

## 2021-08-23 NOTE — ED Notes (Signed)
Pt was given a ginger ale for fluid/PO challenge ?

## 2021-08-23 NOTE — Assessment & Plan Note (Signed)
Continue Synthroid °

## 2021-08-23 NOTE — ED Provider Notes (Signed)
?[facility] DEPT ?Provider Note ? ? ?CSN: [id number] ?Arrival date & time: 08/23/21  1555 ? ?  ? ?History ? ?Chief Complaint  ?Patient presents with  ? Asthma  ? Shortness of Breath  ? ? ?April [name] is a 51 y.o. female. ? ?HPI ?She presents with shortness of breath, felt to be asthma, not improving with current treatment including Trelegy, albuterol, and prednisone.  She called her pulmonologist and was started on prednisone taper, currently on 30 mg today.  No productive cough.  No chest pain.  No dizziness or weakness. ?  ? ?Home Medications ?Prior to Admission medications   ?Medication Sig Start Date End Date Taking? Authorizing Provider  ?albuterol (PROAIR HFA) 108 (90 Base) MCG/ACT inhaler Inhale 2 puffs into the lungs every 6 (six) hours as needed for wheezing or shortness of breath. 06/26/21   Baird Lyons D, MD  ?amLODipine (NORVASC) 10 MG tablet Take 1 tablet (10 mg total) by mouth daily. 06/06/21   [name], MD  ?apixaban (ELIQUIS) 5 MG TABS tablet Take 1 tablet (5 mg total) by mouth 2 (two) times daily. 06/06/21   [name], MD  ?atorvastatin (LIPITOR) 20 MG tablet Take 20 mg by mouth every morning. 10/02/20   [provider]  ?baclofen (LIORESAL) 20 MG tablet Take 20 mg by mouth daily as needed for muscle spasms (pain). 01/08/21   [provider]  ?carvedilol (COREG) 6.25 MG tablet Take 1 tablet (6.25 mg total) by mouth 2 (two) times daily with a meal. 06/06/21   [name], MD  ?cloNIDine (CATAPRES) 0.2 MG tablet Take 1 tablet (0.2 mg total) by mouth 2 (two) times daily. 05/26/21 08/24/21  Terrilee Croak, MD  ?DULoxetine (CYMBALTA) 60 MG capsule Take 1 capsule (60 mg total) by mouth daily. 06/06/21   [name], MD  ?EPINEPHrine 0.3 mg/0.3 mL IJ SOAJ injection Inject 0.3 mg into the muscle as needed for anaphylaxis. ?Patient taking differently: Inject 0.3 mg into the muscle once as needed for anaphylaxis (asthma). 05/30/20   Baird Lyons D, MD   ?fluticasone (FLONASE) 50 MCG/ACT nasal spray SHAKE LIQUID AND USE 1 SPRAY IN EACH NOSTRIL DAILY AS NEEDED FOR ALLERGIES OR RHINITIS 08/05/21   [name], MD  ?Fluticasone-Umeclidin-Vilant (TRELEGY ELLIPTA) 100-62.5-25 MCG/INH AEPB Inhale 1 puff into the lungs daily. 01/23/21   Baird Lyons D, MD  ?furosemide (LASIX) 40 MG tablet Take 1 tablet (40 mg total) by mouth daily. 02/23/20   [name], MD  ?Ipratropium-Albuterol (COMBIVENT) 20-100 MCG/ACT AERS respimat Inhale 1 puff, every 6 hours as needed, rescue 07/31/21   Baird Lyons D, MD  ?ipratropium-albuterol (DUONEB) 0.5-2.5 (3) MG/3ML SOLN Take 3 mLs by nebulization every 6 (six) hours as needed (shortness of breath/wheezing). 02/15/21   Baird Lyons D, MD  ?levothyroxine (SYNTHROID) 50 MCG tablet Take 50 mcg by mouth daily. 10/15/20   [provider]  ?montelukast (SINGULAIR) 10 MG tablet TAKE 1 TABLET BY MOUTH AT BEDTIME. OFFICE VISIT NEEDED FOR ADDITIONAL REFILLS ?Patient taking differently: Take 10 mg by mouth at bedtime. 02/27/21   [name], MD  ?naloxone [facility]) nasal spray 4 mg/0.1 mL Place 1 spray into the nose once as needed (opioid overdose). 08/09/20   [provider]  ?omeprazole (PRILOSEC) 20 MG capsule Take 20 mg by mouth daily as needed (heartburn/indigestion).    [provider]  ?potassium chloride SA (KLOR-CON M) 20 MEQ tablet Take 1 tablet (20 mEq total) by mouth daily. 06/06/21   Newlin, Charlane Ferretti,  MD  ?predniSONE (DELTASONE) 10 MG tablet 1-2 tabs daily only when needed 07/31/21   Baird Lyons D, MD  ?pregabalin (LYRICA) 300 MG capsule Take 300 mg by mouth 2 (two) times daily as needed (pain). 01/08/21   [provider]  ?Semaglutide, 1 MG/DOSE, (OZEMPIC, 1 MG/DOSE,) 4 MG/3ML SOPN Inject 1 mg into the skin every Monday.    [provider]  ?tezepelumab-ekko (TEZSPIRE) 210 MG/1.91ML syringe Inject 210 mg into the skin every 28 (twenty-eight) days. 04/12/21   [provider]   ?topiramate (TOPAMAX) 200 MG tablet Take 200 mg by mouth at bedtime. 01/08/21   [provider]  ?VYVANSE 70 MG capsule Take 70 mg by mouth once a week. 01/20/19   [provider]  ?zolpidem (AMBIEN) 10 MG tablet TAKE 1 TABLET BY MOUTH AT BEDTIME AS NEEDED FOR SLEEP 07/25/21   Young, Tarri Fuller D, MD  ?mometasone-formoterol [facility]) 200-5 MCG/ACT AERO Inhale 2 puffs into the lungs 2 (two) times daily. Dx: Asthma 04/08/19 08/12/19  Deneise Lever, MD  ?   ? ?Allergies    ?Contrast media [iodinated contrast media]   ? ?Review of Systems   ?Review of Systems ? ?Physical Exam ?Updated Vital Signs ?BP (!) 192/99   Pulse (!) 101   Temp 97.9 ?F (36.6 ?C) (Oral)   Resp 16   SpO2 99%  ?Physical Exam ?Vitals and nursing note reviewed.  ?Constitutional:   ?   General: She is in acute distress.  ?   Appearance: She is well-developed. She is obese. She is not ill-appearing.  ?HENT:  ?   Head: Normocephalic and atraumatic.  ?   Right Ear: External ear normal.  ?   Left Ear: External ear normal.  ?Eyes:  ?   Conjunctiva/sclera: Conjunctivae normal.  ?   Pupils: Pupils are equal, round, and reactive to light.  ?Neck:  ?   Trachea: Phonation normal.  ?Cardiovascular:  ?   Rate and Rhythm: Normal rate and regular rhythm.  ?   Heart sounds: Normal heart sounds.  ?Pulmonary:  ?   Effort: Pulmonary effort is normal. No respiratory distress.  ?   Breath sounds: No stridor.  ?   Comments: Tachypneic, prolonged expirations with generalized wheezing.  No increased work of breathing. ?Abdominal:  ?   General: There is no distension.  ?   Palpations: Abdomen is soft.  ?   Tenderness: There is no abdominal tenderness.  ?Musculoskeletal:     ?   General: Normal range of motion.  ?   Cervical back: Normal range of motion and neck supple.  ?Skin: ?   General: Skin is warm and dry.  ?Neurological:  ?   Mental Status: She is alert and oriented to person, place, and time.  ?   Cranial Nerves: No cranial nerve deficit.  ?   Sensory:  No sensory deficit.  ?   Motor: No abnormal muscle tone.  ?   Coordination: Coordination normal.  ?Psychiatric:     ?   Mood and Affect: Mood normal.     ?   Behavior: Behavior normal.     ?   Thought Content: Thought content normal.     ?   Judgment: Judgment normal.  ? ? ?ED Results / Procedures / Treatments   ?Labs ?(all labs ordered are listed, but only abnormal results are displayed) ?Labs Reviewed  ?COMPREHENSIVE METABOLIC PANEL - Abnormal; Notable for the following components:  ?    Result Value  ?  Glucose, Bld 122 (*)   ? Calcium 8.7 (*)   ? Albumin 3.3 (*)   ? All other components within normal limits  ?CBC WITH DIFFERENTIAL/PLATELET - Abnormal; Notable for the following components:  ? WBC 17.9 (*)   ? MCH 25.8 (*)   ? RDW 18.9 (*)   ? Platelets 420 (*)   ? Neutro Abs 16.2 (*)   ? Abs Immature Granulocytes 0.14 (*)   ? All other components within normal limits  ?BLOOD GAS, VENOUS - Abnormal; Notable for the following components:  ? pH, Ven 7.45 (*)   ? pCO2, Ven 42 (*)   ? Bicarbonate 29.2 (*)   ? Acid-Base Excess 4.7 (*)   ? All other components within normal limits  ?RESP PANEL BY RT-PCR (FLU A&B, COVID) [id number]  ? ? ?EKG ?None ? ?Radiology ?DG Chest 2 View ? ?Result Date: 08/23/2021 ?CLINICAL DATA:  Shortness of breath EXAM: CHEST - 2 VIEW COMPARISON:  05/22/2021 FINDINGS: Cardiac size is within normal limits. Central pulmonary vessels are prominent without signs of alveolar pulmonary edema. There is no focal pulmonary consolidation. There is no pleural effusion or pneumothorax. IMPRESSION: Central pulmonary vessels are prominent without signs of pulmonary edema. There is no focal pulmonary consolidation. There is no pleural effusion or pneumothorax. Electronically Signed   By: Elmer Picker M.D.   On: 08/23/2021 16:32   ? ?Procedures ?Procedures  ? ? ?Medications Ordered in ED ?Medications  ?albuterol (PROVENTIL) (2.5 MG/3ML) 0.083% nebulizer solution (2.5 mg/hr Nebulization Given 08/23/21 1649)   ?methylPREDNISolone sodium succinate (SOLU-MEDROL) 125 mg/2 mL injection 125 mg (125 mg Intravenous Given 08/23/21 1646)  ?albuterol (PROVENTIL) (2.5 MG/3ML) 0.083% nebulizer solution (10 mg/hr Nebulization Give

## 2021-08-23 NOTE — Assessment & Plan Note (Signed)
Continue CPAP nightly, nonadherent as an outpatient. ?

## 2021-08-24 DIAGNOSIS — J4552 Severe persistent asthma with status asthmaticus: Secondary | ICD-10-CM | POA: Diagnosis not present

## 2021-08-24 DIAGNOSIS — J4542 Moderate persistent asthma with status asthmaticus: Secondary | ICD-10-CM | POA: Diagnosis not present

## 2021-08-24 MED ORDER — OXYCODONE HCL 5 MG PO TABS
5.0000 mg | ORAL_TABLET | Freq: Every day | ORAL | Status: DC | PRN
Start: 2021-08-24 — End: 2021-08-24
  Administered 2021-08-24: 5 mg via ORAL
  Filled 2021-08-24: qty 1

## 2021-08-24 MED ORDER — DULOXETINE HCL 30 MG PO CPEP: 60.0000 mg | ORAL_CAPSULE | Freq: Every day | ORAL | Status: DC

## 2021-08-24 MED ORDER — OXYCODONE-ACETAMINOPHEN 10-325 MG PO TABS: 1.0000 | ORAL_TABLET | Freq: Every day | ORAL | Status: DC | PRN

## 2021-08-24 MED ORDER — LEVOTHYROXINE SODIUM 50 MCG PO TABS: 50.0000 ug | ORAL_TABLET | Freq: Every day | ORAL | Status: DC

## 2021-08-24 MED ORDER — ZOLPIDEM TARTRATE 5 MG PO TABS
5.0000 mg | ORAL_TABLET | Freq: Once | ORAL | Status: AC
  Administered 2021-08-24: 5 mg via ORAL
  Filled 2021-08-24: qty 1

## 2021-08-24 MED ORDER — CLONIDINE HCL 0.1 MG PO TABS
0.2000 mg | ORAL_TABLET | Freq: Two times a day (BID) | ORAL | Status: DC
  Administered 2021-08-24: 0.2 mg via ORAL
  Filled 2021-08-24: qty 2

## 2021-08-24 MED ORDER — UMECLIDINIUM BROMIDE 62.5 MCG/ACT IN AEPB
1.0000 | INHALATION_SPRAY | Freq: Every day | RESPIRATORY_TRACT | Status: DC
Start: 2021-08-24 — End: 2021-08-24
  Filled 2021-08-24: qty 7

## 2021-08-24 MED ORDER — FUROSEMIDE 40 MG PO TABS: 40.0000 mg | ORAL_TABLET | Freq: Every day | ORAL | Status: DC

## 2021-08-24 MED ORDER — APIXABAN 5 MG PO TABS
5.0000 mg | ORAL_TABLET | Freq: Two times a day (BID) | ORAL | Status: DC
  Administered 2021-08-24: 5 mg via ORAL
  Filled 2021-08-24: qty 1

## 2021-08-24 MED ORDER — FLUTICASONE FUROATE-VILANTEROL 100-25 MCG/ACT IN AEPB
1.0000 | INHALATION_SPRAY | Freq: Every day | RESPIRATORY_TRACT | Status: DC
  Filled 2021-08-24: qty 28

## 2021-08-24 MED ORDER — OXYCODONE-ACETAMINOPHEN 5-325 MG PO TABS: 1.0000 | ORAL_TABLET | Freq: Every day | ORAL | Status: DC | PRN

## 2021-08-24 MED ORDER — ATORVASTATIN CALCIUM 10 MG PO TABS: 20.0000 mg | ORAL_TABLET | Freq: Every morning | ORAL | Status: DC

## 2021-08-24 MED ORDER — PANTOPRAZOLE SODIUM 40 MG PO TBEC: 40.0000 mg | DELAYED_RELEASE_TABLET | Freq: Every day | ORAL | Status: DC

## 2021-08-24 MED ORDER — CARVEDILOL 3.125 MG PO TABS: 6.2500 mg | ORAL_TABLET | Freq: Two times a day (BID) | ORAL | Status: DC

## 2021-08-24 MED ORDER — MONTELUKAST SODIUM 10 MG PO TABS
10.0000 mg | ORAL_TABLET | Freq: Every day | ORAL | Status: DC
Start: 2021-08-24 — End: 2021-08-24
  Administered 2021-08-24: 10 mg via ORAL
  Filled 2021-08-24: qty 1

## 2021-08-24 MED ORDER — AMLODIPINE BESYLATE 5 MG PO TABS: 10.0000 mg | ORAL_TABLET | Freq: Every day | ORAL | Status: DC

## 2021-08-24 MED ORDER — PREGABALIN 50 MG PO CAPS: 300.0000 mg | ORAL_CAPSULE | Freq: Two times a day (BID) | ORAL | Status: DC | PRN

## 2021-08-24 NOTE — Discharge Summary (Signed)
?Physician Discharge Summary ?  ?Patient: April [name] MRN: [id number] DOB: 07/21/1970  ?Admit date:     08/23/2021  ?Discharge date: 08/24/21  ?Discharge Physician: Zada Finders  ? ?PCP: [name], MD  ? ?Recommendations at discharge:  ? ?Patient left AGAINST MEDICAL ADVICE several hours after admission to the floor, please see H&P dated 08/23/2021 for details regarding initial plan of care. ? ?Discharge Diagnoses: ?Principal Problem: ?  Severe persistent asthma with acute exacerbation in adult ?Active Problems: ?  HTN (hypertension) ?  AF (paroxysmal atrial fibrillation) ([place]) ?  Chronic heart failure with preserved ejection fraction (HFpEF) ([place]) ?  Hypothyroidism ?  Leukocytosis ?  Hyperlipidemia ?  Anxiety and depression ?  Obstructive sleep apnea ? ?Resolved Problems: ?  * No resolved hospital problems. * ? ?Hospital Course: ?April [name] is a 51 y.o. female with medical history significant for severe persistent asthma, paroxysmal atrial fibrillation on Eliquis, HFpEF (EF 65-70% by TTE 05/25/2021), HTN, HLD, hypothyroidism, depression/anxiety, OSA nonadherent to CPAP, morbid obesity who is admitted with acute asthma exacerbation. ? ?Assessment and Plan: ?* Severe persistent asthma with acute exacerbation in adult ?Admitted with asthma exacerbation failing outpatient management.  Still with some wheezing on admission despite 3 continuous albuterol nebulizer treatments.  Not requiring supplemental oxygen while at rest. ?-Continue IV Solu-Medrol 40 mg twice daily ?-Start scheduled DuoNebs with albuterol as needed ?-Continue home Trelegy, Singulair ?-Supplemental oxygen if needed ? ?HTN (hypertension) ?Significant hypertension on admission.  Has not had PM meds yet. ?-Continue home clonidine 0.2 mg twice daily, Coreg 6.25 mg twice daily, amlodipine 10 mg daily, Lasix 40 mg daily ?-IV hydralazine as needed ? ?AF (paroxysmal atrial fibrillation) ([place]) ?Continue Coreg and Eliquis. ? ?Chronic heart failure with  preserved ejection fraction (HFpEF) ([place]) ?Euvolemic on admission.  Continue home Lasix 40 mg daily. ? ?Hypothyroidism ?Continue Synthroid. ? ?Obstructive sleep apnea ?Continue CPAP nightly, nonadherent as an outpatient. ? ?Anxiety and depression ?Continue home Cymbalta, Vyvanse. ? ?Hyperlipidemia ?Continue atorvastatin. ? ?Leukocytosis ?Chronic leukocytosis likely related to steroid use.  No evidence of active infection at this time. ? ? ? ? ?  ? ? ?Consultants: None ?Procedures performed: None ?Disposition:  Left AGAINST MEDICAL ADVICE to home ? ?DISCHARGE MEDICATION: ?Allergies as of 08/24/2021   ? ?   Reactions  ? Contrast Media [iodinated Contrast Media] Itching  ? Ct contrast  ? ?  ? ?  ?Medication List  ?  ? ?ASK your doctor about these medications   ? ?albuterol 108 (90 Base) MCG/ACT inhaler ?Commonly known as: ProAir HFA ?Inhale 2 puffs into the lungs every 6 (six) hours as needed for wheezing or shortness of breath. ?  ?amLODipine 10 MG tablet ?Commonly known as: NORVASC ?Take 1 tablet (10 mg total) by mouth daily. ?  ?apixaban 5 MG Tabs tablet ?Commonly known as: ELIQUIS ?Take 1 tablet (5 mg total) by mouth 2 (two) times daily. ?  ?atorvastatin 20 MG tablet ?Commonly known as: LIPITOR ?Take 20 mg by mouth every morning. ?  ?baclofen 20 MG tablet ?Commonly known as: LIORESAL ?Take 20 mg by mouth daily as needed for muscle spasms (pain). ?  ?carvedilol 6.25 MG tablet ?Commonly known as: COREG ?Take 1 tablet (6.25 mg total) by mouth 2 (two) times daily with a meal. ?  ?cloNIDine 0.2 MG tablet ?Commonly known as: CATAPRES ?Take 1 tablet (0.2 mg total) by mouth 2 (two) times daily. ?  ?Diclofenac Sodium 3 % Gel ?Apply 1 application. topically daily as needed. ?  ?  DULoxetine 60 MG capsule ?Commonly known as: CYMBALTA ?Take 1 capsule (60 mg total) by mouth daily. ?  ?EPINEPHrine 0.3 mg/0.3 mL Soaj injection ?Commonly known as: EPI-PEN ?Inject 0.3 mg into the muscle as needed for anaphylaxis. ?  ?fluticasone 50  MCG/ACT nasal spray ?Commonly known as: FLONASE ?SHAKE LIQUID AND USE 1 SPRAY IN EACH NOSTRIL DAILY AS NEEDED FOR ALLERGIES OR RHINITIS ?  ?furosemide 40 MG tablet ?Commonly known as: LASIX ?Take 1 tablet (40 mg total) by mouth daily. ?  ?ipratropium-albuterol 0.5-2.5 (3) MG/3ML Soln ?Commonly known as: DUONEB ?Take 3 mLs by nebulization every 6 (six) hours as needed (shortness of breath/wheezing). ?  ?Ipratropium-Albuterol 20-100 MCG/ACT Aers respimat ?Commonly known as: COMBIVENT ?Inhale 1 puff, every 6 hours as needed, rescue ?  ?levothyroxine 50 MCG tablet ?Commonly known as: SYNTHROID ?Take 50 mcg by mouth daily. ?  ?montelukast 10 MG tablet ?Commonly known as: SINGULAIR ?TAKE 1 TABLET BY MOUTH AT BEDTIME. OFFICE VISIT NEEDED FOR ADDITIONAL REFILLS ?  ?naloxone 4 MG/0.1ML Liqd nasal spray kit ?Commonly known as: NARCAN ?Place 1 spray into the nose once as needed (opioid overdose). ?  ?omeprazole 20 MG capsule ?Commonly known as: PRILOSEC ?Take 40 mg by mouth daily. ?  ?oxyCODONE-acetaminophen 10-325 MG tablet ?Commonly known as: PERCOCET ?Take 1 tablet by mouth 5 (five) times daily as needed (nerve pain). ?  ?Ozempic (1 MG/DOSE) 4 MG/3ML Sopn ?Generic drug: Semaglutide (1 MG/DOSE) ?Inject 1 mg into the skin every Monday. ?  ?phentermine 15 MG capsule ?Take 15 mg by mouth daily. ?  ?potassium chloride SA 20 MEQ tablet ?Commonly known as: KLOR-CON M ?Take 1 tablet (20 mEq total) by mouth daily. ?  ?predniSONE 10 MG tablet ?Commonly known as: DELTASONE ?1-2 tabs daily only when needed ?  ?pregabalin 300 MG capsule ?Commonly known as: LYRICA ?Take 300 mg by mouth 2 (two) times daily as needed (pain). ?  ?Tezspire 210 MG/1.91ML syringe ?Generic drug: tezepelumab-ekko ?Inject 210 mg into the skin every 28 (twenty-eight) days. ?  ?topiramate 200 MG tablet ?Commonly known as: TOPAMAX ?Take 200 mg by mouth at bedtime. ?  ?Trelegy Ellipta 100-62.5-25 MCG/ACT Aepb ?Generic drug: Fluticasone-Umeclidin-Vilant ?Inhale 1 puff  into the lungs daily. ?  ?zolpidem 10 MG tablet ?Commonly known as: AMBIEN ?TAKE 1 TABLET BY MOUTH AT BEDTIME AS NEEDED FOR SLEEP ?  ? ?  ? ? ?Discharge Exam: ?Filed Weights  ? 08/24/21 0039  ?Weight: (!) 148.1 kg  ? ?Condition at discharge: poor ? ?The results of significant diagnostics from this hospitalization (including imaging, microbiology, ancillary and laboratory) are listed below for reference.  ? ?Imaging Studies: ?DG Chest 2 View ? ?Result Date: 08/23/2021 ?CLINICAL DATA:  Shortness of breath EXAM: CHEST - 2 VIEW COMPARISON:  05/22/2021 FINDINGS: Cardiac size is within normal limits. Central pulmonary vessels are prominent without signs of alveolar pulmonary edema. There is no focal pulmonary consolidation. There is no pleural effusion or pneumothorax. IMPRESSION: Central pulmonary vessels are prominent without signs of pulmonary edema. There is no focal pulmonary consolidation. There is no pleural effusion or pneumothorax. Electronically Signed   By: Elmer Picker M.D.   On: 08/23/2021 16:32   ? ?Microbiology: ?Results for orders placed or performed during the hospital encounter of 08/23/21  ?Resp Panel by RT-PCR (Flu A&B, Covid) Nasopharyngeal Swab     Status: None  ? Collection Time: 08/23/21  4:20 PM  ? Specimen: Nasopharyngeal Swab; Nasopharyngeal(NP) swabs in vial transport medium  ?Result Value Ref Range Status  ?  SARS Coronavirus 2 by RT PCR NEGATIVE NEGATIVE Final  ?  Comment: (NOTE) ?SARS-CoV-2 target nucleic acids are NOT DETECTED. ? ?The SARS-CoV-2 RNA is generally detectable in upper respiratory ?specimens during the acute phase of infection. The lowest ?concentration of SARS-CoV-2 viral copies this assay can detect is ?138 copies/mL. A negative result does not preclude SARS-Cov-2 ?infection and should not be used as the sole basis for treatment or ?other patient management decisions. A negative result may occur with  ?improper specimen collection/handling, submission of specimen  other ?than nasopharyngeal swab, presence of viral mutation(s) within the ?areas targeted by this assay, and inadequate number of viral ?copies(<138 copies/mL). A negative result must be combined with ?clinical ob

## 2021-08-24 NOTE — Progress Notes (Signed)
?   08/24/21 0021  ?Assess: MEWS Score  ?Temp 98.6 ?F (37 ?C)  ?BP (!) 223/115  ?Pulse Rate (!) 106  ?Resp 18  ?Level of Consciousness Alert  ?SpO2 100 %  ?O2 Device Room Air  ?Assess: MEWS Score  ?MEWS Temp 0  ?MEWS Systolic 2  ?MEWS Pulse 1  ?MEWS RR 0  ?MEWS LOC 0  ?MEWS Score 3  ?MEWS Score Color Yellow  ?Assess: if the MEWS score is Yellow or Red  ?Were vital signs taken at a resting state? Yes  ?Focused Assessment No change from prior assessment  ?Does the patient meet 2 or more of the SIRS criteria? No  ?MEWS guidelines implemented *See Row Information* Yes  ?Take Vital Signs  ?Increase Vital Sign Frequency  Yellow: Q 2hr X 2 then Q 4hr X 2, if remains yellow, continue Q 4hrs  ?Escalate  ?MEWS: Escalate Yellow: discuss with charge nurse/RN and consider discussing with provider and RRT  ?Notify: Charge Nurse/RN  ?Name of Charge Nurse/RN Notified Lauren  ?Date Charge Nurse/RN Notified 08/24/21  ?Time Charge Nurse/RN Notified 0123  ?Document  ?Patient Outcome Stabilized after interventions  ?Progress note created (see row info) Yes  ?Assess: SIRS CRITERIA  ?SIRS Temperature  0  ?SIRS Pulse 1  ?SIRS Respirations  0  ?SIRS WBC 0  ?SIRS Score Sum  1  ? ? ?

## 2021-08-24 NOTE — Progress Notes (Signed)
? ? ? ?                                               Against Medical Advice ?Patient at this time expresses desire to leave the Hospital immediately, patient has been warned that this is not Medically advisable at this time, and can result in Medical complications like Death and Disability, patient understands and accepts the risks involved and assumes full responsibilty of this decision. ? ?This patient has also been advised that if they feel the need for further medical assistance to return to any available ER or dial [phone]. ? ?Informed by Nursing staff that this patient has left care and has signed the form  Against Medical Advice on 08/24/2021 at 0335 hrs ? ?Gershon Cull BSN MSNA MSN ACNPC-AG ?Acute Care Nurse Practitioner ?[facility] ?[place] ? ? ? ?

## 2021-08-24 NOTE — Progress Notes (Signed)
Patient is refusing CPAP for the night. Patient states she wears 3-5L of O2 at night since she can't tolerate the CPAP. Placed patient on 3L O2 for the night. RN aware.  ?

## 2021-08-24 NOTE — Progress Notes (Signed)
NT observed PT to be dressing in street cloths and stating she wants to leave. PT Alert and oriented. This RN asked patient if there was anything she could do so she will stay. PT stated "I am hot and I cant get comfortable" RN stated she could decrease room temp and supply a fan but PT refused. This Rn educated patient on risk of leaving hospital before treatment is done. RN educated patient on risk of respiratory problems with current state of asthma and risk of stroke with her current high BP. Patient stated "I know I wont have a stroke, I will be fine, I just want to go home' This RN notified charge nurse and Olena Heckle, NP of situation. AMA form signed by patient and IV removed. PT left with belongings.  ?

## 2021-08-26 ENCOUNTER — Emergency Department (HOSPITAL_COMMUNITY): Payer: Medicare Other

## 2021-08-26 ENCOUNTER — Other Ambulatory Visit: Payer: Self-pay

## 2021-08-26 ENCOUNTER — Encounter (HOSPITAL_COMMUNITY): Payer: Self-pay | Admitting: Oncology

## 2021-08-26 ENCOUNTER — Inpatient Hospital Stay (HOSPITAL_COMMUNITY)
Admission: EM | Admit: 2021-08-26 | Discharge: 2021-08-30 | DRG: 202 | Disposition: A | Discharge status: Home / Self Care | Payer: Medicare Other | Attending: Internal Medicine | Admitting: Internal Medicine

## 2021-08-26 DIAGNOSIS — D72829 Elevated white blood cell count, unspecified: Secondary | ICD-10-CM | POA: Diagnosis present

## 2021-08-26 DIAGNOSIS — Z8701 Personal history of pneumonia (recurrent): Secondary | ICD-10-CM

## 2021-08-26 DIAGNOSIS — Z7901 Long term (current) use of anticoagulants: Secondary | ICD-10-CM

## 2021-08-26 DIAGNOSIS — E785 Hyperlipidemia, unspecified: Secondary | ICD-10-CM | POA: Diagnosis present

## 2021-08-26 DIAGNOSIS — Z825 Family history of asthma and other chronic lower respiratory diseases: Secondary | ICD-10-CM

## 2021-08-26 DIAGNOSIS — J4552 Severe persistent asthma with status asthmaticus: Principal | ICD-10-CM | POA: Diagnosis present

## 2021-08-26 DIAGNOSIS — F32A Depression, unspecified: Secondary | ICD-10-CM | POA: Diagnosis present

## 2021-08-26 DIAGNOSIS — I10 Essential (primary) hypertension: Secondary | ICD-10-CM | POA: Diagnosis present

## 2021-08-26 DIAGNOSIS — J4551 Severe persistent asthma with (acute) exacerbation: Secondary | ICD-10-CM

## 2021-08-26 DIAGNOSIS — Z91041 Radiographic dye allergy status: Secondary | ICD-10-CM

## 2021-08-26 DIAGNOSIS — Z79899 Other long term (current) drug therapy: Secondary | ICD-10-CM

## 2021-08-26 DIAGNOSIS — Z79890 Hormone replacement therapy (postmenopausal): Secondary | ICD-10-CM

## 2021-08-26 DIAGNOSIS — I480 Paroxysmal atrial fibrillation: Secondary | ICD-10-CM | POA: Diagnosis present

## 2021-08-26 DIAGNOSIS — J4542 Moderate persistent asthma with status asthmaticus: Principal | ICD-10-CM

## 2021-08-26 DIAGNOSIS — Z20822 Contact with and (suspected) exposure to covid-19: Secondary | ICD-10-CM | POA: Diagnosis present

## 2021-08-26 DIAGNOSIS — G4733 Obstructive sleep apnea (adult) (pediatric): Secondary | ICD-10-CM | POA: Diagnosis present

## 2021-08-26 DIAGNOSIS — Z6841 Body Mass Index (BMI) 40.0 and over, adult: Secondary | ICD-10-CM

## 2021-08-26 DIAGNOSIS — M069 Rheumatoid arthritis, unspecified: Secondary | ICD-10-CM | POA: Diagnosis present

## 2021-08-26 DIAGNOSIS — T380X5A Adverse effect of glucocorticoids and synthetic analogues, initial encounter: Secondary | ICD-10-CM | POA: Diagnosis present

## 2021-08-26 DIAGNOSIS — J9621 Acute and chronic respiratory failure with hypoxia: Secondary | ICD-10-CM | POA: Diagnosis present

## 2021-08-26 DIAGNOSIS — I5033 Acute on chronic diastolic (congestive) heart failure: Secondary | ICD-10-CM | POA: Diagnosis present

## 2021-08-26 DIAGNOSIS — G4700 Insomnia, unspecified: Secondary | ICD-10-CM | POA: Diagnosis present

## 2021-08-26 DIAGNOSIS — I110 Hypertensive heart disease with heart failure: Secondary | ICD-10-CM | POA: Diagnosis present

## 2021-08-26 DIAGNOSIS — J449 Chronic obstructive pulmonary disease, unspecified: Secondary | ICD-10-CM | POA: Diagnosis present

## 2021-08-26 DIAGNOSIS — I5032 Chronic diastolic (congestive) heart failure: Secondary | ICD-10-CM

## 2021-08-26 DIAGNOSIS — K219 Gastro-esophageal reflux disease without esophagitis: Secondary | ICD-10-CM | POA: Diagnosis present

## 2021-08-26 DIAGNOSIS — E039 Hypothyroidism, unspecified: Secondary | ICD-10-CM | POA: Diagnosis present

## 2021-08-26 DIAGNOSIS — Z7951 Long term (current) use of inhaled steroids: Secondary | ICD-10-CM

## 2021-08-26 DIAGNOSIS — Z9851 Tubal ligation status: Secondary | ICD-10-CM

## 2021-08-26 DIAGNOSIS — G8929 Other chronic pain: Secondary | ICD-10-CM | POA: Diagnosis present

## 2021-08-26 DIAGNOSIS — I509 Heart failure, unspecified: Secondary | ICD-10-CM

## 2021-08-26 DIAGNOSIS — Z87891 Personal history of nicotine dependence: Secondary | ICD-10-CM

## 2021-08-26 DIAGNOSIS — J45901 Unspecified asthma with (acute) exacerbation: Secondary | ICD-10-CM | POA: Diagnosis present

## 2021-08-26 DIAGNOSIS — F419 Anxiety disorder, unspecified: Secondary | ICD-10-CM | POA: Diagnosis present

## 2021-08-26 DIAGNOSIS — Z8249 Family history of ischemic heart disease and other diseases of the circulatory system: Secondary | ICD-10-CM

## 2021-08-26 LAB — CBC WITH DIFFERENTIAL/PLATELET
Abs Immature Granulocytes: 0.09 10*3/uL — ABNORMAL HIGH (ref 0.00–0.07)
Basophils Absolute: 0.1 10*3/uL (ref 0.0–0.1)
Basophils Relative: 0 %
Eosinophils Absolute: 0.5 10*3/uL (ref 0.0–0.5)
Eosinophils Relative: 3 %
HCT: 40.3 % (ref 36.0–46.0)
Hemoglobin: 12.6 g/dL (ref 12.0–15.0)
Immature Granulocytes: 1 %
Lymphocytes Relative: 28 %
Lymphs Abs: 4.7 10*3/uL — ABNORMAL HIGH (ref 0.7–4.0)
MCH: 25.6 pg — ABNORMAL LOW (ref 26.0–34.0)
MCHC: 31.3 g/dL (ref 30.0–36.0)
MCV: 81.7 fL (ref 80.0–100.0)
Monocytes Absolute: 1.5 10*3/uL — ABNORMAL HIGH (ref 0.1–1.0)
Monocytes Relative: 9 %
Neutro Abs: 9.9 10*3/uL — ABNORMAL HIGH (ref 1.7–7.7)
Neutrophils Relative %: 59 %
Platelets: 418 10*3/uL — ABNORMAL HIGH (ref 150–400)
RBC: 4.93 MIL/uL (ref 3.87–5.11)
RDW: 19.6 % — ABNORMAL HIGH (ref 11.5–15.5)
WBC: 16.7 10*3/uL — ABNORMAL HIGH (ref 4.0–10.5)
nRBC: 0 % (ref 0.0–0.2)

## 2021-08-26 LAB — BASIC METABOLIC PANEL
Anion gap: 7 (ref 5–15)
BUN: 13 mg/dL (ref 6–20)
CO2: 28 mmol/L (ref 22–32)
Calcium: 9.1 mg/dL (ref 8.9–10.3)
Chloride: 104 mmol/L (ref 98–111)
Creatinine, Ser: 0.91 mg/dL (ref 0.44–1.00)
GFR, Estimated: >60 mL/min (ref >60)
Glucose, Bld: 94 mg/dL (ref 70–99)
Potassium: 3.7 mmol/L (ref 3.5–5.1)
Sodium: 139 mmol/L (ref 135–145)

## 2021-08-26 LAB — BRAIN NATRIURETIC PEPTIDE: B Natriuretic Peptide: 32.6 pg/mL (ref 0.0–100.0)

## 2021-08-26 LAB — RESP PANEL BY RT-PCR (FLU A&B, COVID) ARPGX2
Influenza A by PCR: NEGATIVE
Influenza B by PCR: NEGATIVE
SARS Coronavirus 2 by RT PCR: NEGATIVE

## 2021-08-26 MED ORDER — TOPIRAMATE 100 MG PO TABS
200.0000 mg | ORAL_TABLET | Freq: Every day | ORAL | Status: DC
  Administered 2021-08-26 – 2021-08-29 (×4): 200 mg via ORAL
  Filled 2021-08-26 (×4): qty 2

## 2021-08-26 MED ORDER — ACETAMINOPHEN 650 MG RE SUPP: 650.0000 mg | Freq: Four times a day (QID) | RECTAL | Status: DC | PRN

## 2021-08-26 MED ORDER — METHYLPREDNISOLONE SODIUM SUCC 125 MG IJ SOLR
60.0000 mg | Freq: Two times a day (BID) | INTRAMUSCULAR | Status: DC
  Administered 2021-08-27 – 2021-08-30 (×7): 60 mg via INTRAVENOUS
  Filled 2021-08-26 (×7): qty 2

## 2021-08-26 MED ORDER — ALBUTEROL SULFATE (2.5 MG/3ML) 0.083% IN NEBU
10.0000 mg | INHALATION_SOLUTION | Freq: Once | RESPIRATORY_TRACT | Status: AC
  Administered 2021-08-26: 10 mg via RESPIRATORY_TRACT
  Filled 2021-08-26: qty 12

## 2021-08-26 MED ORDER — ALBUTEROL (5 MG/ML) CONTINUOUS INHALATION SOLN
10.0000 mg/h | INHALATION_SOLUTION | Freq: Once | RESPIRATORY_TRACT | Status: DC
  Filled 2021-08-26: qty 20

## 2021-08-26 MED ORDER — PANTOPRAZOLE SODIUM 40 MG PO TBEC
40.0000 mg | DELAYED_RELEASE_TABLET | Freq: Every day | ORAL | Status: DC
  Administered 2021-08-27 – 2021-08-29 (×3): 40 mg via ORAL
  Filled 2021-08-26 (×3): qty 1

## 2021-08-26 MED ORDER — LEVOTHYROXINE SODIUM 50 MCG PO TABS
50.0000 ug | ORAL_TABLET | Freq: Every day | ORAL | Status: DC
  Administered 2021-08-27 – 2021-08-30 (×4): 50 ug via ORAL
  Filled 2021-08-26 (×4): qty 1

## 2021-08-26 MED ORDER — CLONIDINE HCL 0.2 MG PO TABS
0.2000 mg | ORAL_TABLET | Freq: Two times a day (BID) | ORAL | Status: DC
  Administered 2021-08-26 – 2021-08-30 (×8): 0.2 mg via ORAL
  Filled 2021-08-26 (×8): qty 1

## 2021-08-26 MED ORDER — IPRATROPIUM-ALBUTEROL 0.5-2.5 (3) MG/3ML IN SOLN
3.0000 mL | Freq: Four times a day (QID) | RESPIRATORY_TRACT | Status: DC
  Administered 2021-08-27 – 2021-08-30 (×14): 3 mL via RESPIRATORY_TRACT
  Filled 2021-08-26 (×14): qty 3

## 2021-08-26 MED ORDER — FLUTICASONE FUROATE-VILANTEROL 100-25 MCG/ACT IN AEPB
1.0000 | INHALATION_SPRAY | Freq: Every day | RESPIRATORY_TRACT | Status: DC
Start: 2021-08-27 — End: 2021-08-26
  Filled 2021-08-26: qty 28

## 2021-08-26 MED ORDER — PREGABALIN 75 MG PO CAPS
300.0000 mg | ORAL_CAPSULE | Freq: Two times a day (BID) | ORAL | Status: DC
  Administered 2021-08-26 – 2021-08-30 (×8): 300 mg via ORAL
  Filled 2021-08-26 (×8): qty 4

## 2021-08-26 MED ORDER — ACETAMINOPHEN 325 MG PO TABS: 650.0000 mg | ORAL_TABLET | Freq: Four times a day (QID) | ORAL | Status: DC | PRN

## 2021-08-26 MED ORDER — ALUM & MAG HYDROXIDE-SIMETH 200-200-20 MG/5ML PO SUSP
30.0000 mL | ORAL | Status: DC | PRN
  Administered 2021-08-26 – 2021-08-29 (×7): 30 mL via ORAL
  Filled 2021-08-26 (×7): qty 30

## 2021-08-26 MED ORDER — APIXABAN 5 MG PO TABS
5.0000 mg | ORAL_TABLET | Freq: Two times a day (BID) | ORAL | Status: DC
  Administered 2021-08-26 – 2021-08-30 (×8): 5 mg via ORAL
  Filled 2021-08-26 (×8): qty 1

## 2021-08-26 MED ORDER — MAGNESIUM SULFATE 2 GM/50ML IV SOLN
2.0000 g | Freq: Once | INTRAVENOUS | Status: AC
  Administered 2021-08-26: 2 g via INTRAVENOUS
  Filled 2021-08-26: qty 50

## 2021-08-26 MED ORDER — OXYCODONE-ACETAMINOPHEN 5-325 MG PO TABS
1.0000 | ORAL_TABLET | Freq: Four times a day (QID) | ORAL | Status: DC | PRN
  Administered 2021-08-26 – 2021-08-30 (×8): 1 via ORAL
  Filled 2021-08-26 (×9): qty 1

## 2021-08-26 MED ORDER — OXYCODONE HCL 5 MG PO TABS
5.0000 mg | ORAL_TABLET | Freq: Four times a day (QID) | ORAL | Status: DC | PRN
  Administered 2021-08-26 – 2021-08-30 (×8): 5 mg via ORAL
  Filled 2021-08-26 (×9): qty 1

## 2021-08-26 MED ORDER — ENOXAPARIN SODIUM 40 MG/0.4ML IJ SOSY: 40.0000 mg | PREFILLED_SYRINGE | INTRAMUSCULAR | Status: DC

## 2021-08-26 MED ORDER — BUDESONIDE 0.25 MG/2ML IN SUSP
0.2500 mg | Freq: Two times a day (BID) | RESPIRATORY_TRACT | Status: DC
  Administered 2021-08-27 – 2021-08-29 (×6): 0.25 mg via RESPIRATORY_TRACT
  Filled 2021-08-26 (×6): qty 2

## 2021-08-26 MED ORDER — UMECLIDINIUM BROMIDE 62.5 MCG/ACT IN AEPB
1.0000 | INHALATION_SPRAY | Freq: Every day | RESPIRATORY_TRACT | Status: DC
  Filled 2021-08-26: qty 7

## 2021-08-26 MED ORDER — ALBUTEROL SULFATE (2.5 MG/3ML) 0.083% IN NEBU
2.5000 mg | INHALATION_SOLUTION | RESPIRATORY_TRACT | Status: DC | PRN
  Administered 2021-08-27 – 2021-08-30 (×5): 2.5 mg via RESPIRATORY_TRACT
  Filled 2021-08-26 (×5): qty 3

## 2021-08-26 MED ORDER — BACLOFEN 20 MG PO TABS
20.0000 mg | ORAL_TABLET | Freq: Every day | ORAL | Status: DC | PRN
  Administered 2021-08-27 – 2021-08-29 (×2): 20 mg via ORAL
  Filled 2021-08-26 (×2): qty 1

## 2021-08-26 MED ORDER — DULOXETINE HCL 60 MG PO CPEP
60.0000 mg | ORAL_CAPSULE | Freq: Every day | ORAL | Status: DC
  Administered 2021-08-27 – 2021-08-30 (×4): 60 mg via ORAL
  Filled 2021-08-26 (×6): qty 1

## 2021-08-26 MED ORDER — ALBUTEROL SULFATE HFA 108 (90 BASE) MCG/ACT IN AERS: 2.0000 | INHALATION_SPRAY | RESPIRATORY_TRACT | Status: DC | PRN

## 2021-08-26 MED ORDER — ALBUTEROL (5 MG/ML) CONTINUOUS INHALATION SOLN: 10.0000 mg/h | INHALATION_SOLUTION | Freq: Once | RESPIRATORY_TRACT | Status: DC

## 2021-08-26 MED ORDER — AMLODIPINE BESYLATE 10 MG PO TABS
10.0000 mg | ORAL_TABLET | Freq: Every day | ORAL | Status: DC
Start: 2021-08-27 — End: 2021-08-30
  Administered 2021-08-27 – 2021-08-30 (×4): 10 mg via ORAL
  Filled 2021-08-26 (×4): qty 1

## 2021-08-26 MED ORDER — FUROSEMIDE 10 MG/ML IJ SOLN
40.0000 mg | Freq: Once | INTRAMUSCULAR | Status: AC
  Administered 2021-08-26: 40 mg via INTRAVENOUS
  Filled 2021-08-26: qty 4

## 2021-08-26 MED ORDER — ZOLPIDEM TARTRATE 5 MG PO TABS
5.0000 mg | ORAL_TABLET | Freq: Every evening | ORAL | Status: DC | PRN
  Administered 2021-08-26 – 2021-08-29 (×4): 5 mg via ORAL
  Filled 2021-08-26 (×4): qty 1

## 2021-08-26 MED ORDER — OXYCODONE-ACETAMINOPHEN 10-325 MG PO TABS: 1.0000 | ORAL_TABLET | Freq: Four times a day (QID) | ORAL | Status: DC | PRN

## 2021-08-26 MED ORDER — IPRATROPIUM BROMIDE 0.02 % IN SOLN
0.5000 mg | Freq: Once | RESPIRATORY_TRACT | Status: AC
  Administered 2021-08-26: 0.5 mg via RESPIRATORY_TRACT
  Filled 2021-08-26: qty 2.5

## 2021-08-26 MED ORDER — METHYLPREDNISOLONE SODIUM SUCC 125 MG IJ SOLR
125.0000 mg | Freq: Once | INTRAMUSCULAR | Status: AC
  Administered 2021-08-26: 125 mg via INTRAVENOUS
  Filled 2021-08-26: qty 2

## 2021-08-26 MED ORDER — ATORVASTATIN CALCIUM 20 MG PO TABS
20.0000 mg | ORAL_TABLET | Freq: Every morning | ORAL | Status: DC
Start: 2021-08-27 — End: 2021-08-30
  Administered 2021-08-27 – 2021-08-30 (×4): 20 mg via ORAL
  Filled 2021-08-26 (×4): qty 1

## 2021-08-26 MED ORDER — CARVEDILOL 6.25 MG PO TABS
6.2500 mg | ORAL_TABLET | Freq: Two times a day (BID) | ORAL | Status: DC
  Administered 2021-08-26 – 2021-08-30 (×8): 6.25 mg via ORAL
  Filled 2021-08-26 (×8): qty 1

## 2021-08-26 MED ORDER — MONTELUKAST SODIUM 10 MG PO TABS
10.0000 mg | ORAL_TABLET | Freq: Every day | ORAL | Status: DC
  Administered 2021-08-26 – 2021-08-29 (×4): 10 mg via ORAL
  Filled 2021-08-26 (×4): qty 1

## 2021-08-26 NOTE — ED Triage Notes (Signed)
Pt here w/ c/o shob, CP. Pt reports being admitted Thursday for her asthma however left AMA the same day. Pt is having difficulty speaking in full sentences.  ?

## 2021-08-26 NOTE — ED Notes (Signed)
Patient ambulated to the bathroom.

## 2021-08-26 NOTE — ED Provider Notes (Signed)
?[facility] ?Provider Note ? ? ?CSN: [id number] ?Arrival date & time: 08/26/21  1527 ? ?  ? ?History ? ?Chief Complaint  ?Patient presents with  ? Shortness of Breath  ? ? ?April [name] is a 51 y.o. female. ? ?HPI ? ?  ? ?51 year old female comes in with chief complaint of shortness of breath.  Patient has history of asthma, CHF with preserved EF, A-fib on Eliquis. ? ?Patient was admitted to the hospital on 4-27 and left AMA on 4-28.  She said that she was feeling better, but yesterday she started getting worse.  This morning she could not tolerate her shortness of breath and decided to come to the ER.  Patient also has a cough.  Denies any fevers, chills.  She denies orthopnea right now.  She took 1 breathing treatment without significant response. ? ?Home Medications ?Prior to Admission medications   ?Medication Sig Start Date End Date Taking? Authorizing Provider  ?albuterol (PROAIR HFA) 108 (90 Base) MCG/ACT inhaler Inhale 2 puffs into the lungs every 6 (six) hours as needed for wheezing or shortness of breath. 06/26/21   Baird Lyons D, MD  ?amLODipine (NORVASC) 10 MG tablet Take 1 tablet (10 mg total) by mouth daily. 06/06/21   [name], MD  ?apixaban (ELIQUIS) 5 MG TABS tablet Take 1 tablet (5 mg total) by mouth 2 (two) times daily. 06/06/21   [name], MD  ?atorvastatin (LIPITOR) 20 MG tablet Take 20 mg by mouth every morning. 10/02/20   [provider]  ?baclofen (LIORESAL) 20 MG tablet Take 20 mg by mouth daily as needed for muscle spasms (pain). 01/08/21   [provider]  ?carvedilol (COREG) 6.25 MG tablet Take 1 tablet (6.25 mg total) by mouth 2 (two) times daily with a meal. 06/06/21   [name], MD  ?cloNIDine (CATAPRES) 0.2 MG tablet Take 1 tablet (0.2 mg total) by mouth 2 (two) times daily. 05/26/21 08/24/21  Terrilee Croak, MD  ?Diclofenac Sodium 3 % GEL Apply 1 application. topically daily as needed. 08/03/21   [provider]   ?DULoxetine (CYMBALTA) 60 MG capsule Take 1 capsule (60 mg total) by mouth daily. 06/06/21   [name], MD  ?EPINEPHrine 0.3 mg/0.3 mL IJ SOAJ injection Inject 0.3 mg into the muscle as needed for anaphylaxis. ?Patient not taking: Reported on 08/23/2021 05/30/20   Baird Lyons D, MD  ?fluticasone (FLONASE) 50 MCG/ACT nasal spray SHAKE LIQUID AND USE 1 SPRAY IN EACH NOSTRIL DAILY AS NEEDED FOR ALLERGIES OR RHINITIS 08/05/21   [name], MD  ?Fluticasone-Umeclidin-Vilant (TRELEGY ELLIPTA) 100-62.5-25 MCG/INH AEPB Inhale 1 puff into the lungs daily. 01/23/21   Baird Lyons D, MD  ?furosemide (LASIX) 40 MG tablet Take 1 tablet (40 mg total) by mouth daily. 02/23/20   [name], MD  ?Ipratropium-Albuterol (COMBIVENT) 20-100 MCG/ACT AERS respimat Inhale 1 puff, every 6 hours as needed, rescue 07/31/21   Baird Lyons D, MD  ?ipratropium-albuterol (DUONEB) 0.5-2.5 (3) MG/3ML SOLN Take 3 mLs by nebulization every 6 (six) hours as needed (shortness of breath/wheezing). 02/15/21   Baird Lyons D, MD  ?levothyroxine (SYNTHROID) 50 MCG tablet Take 50 mcg by mouth daily. 10/15/20   [provider]  ?montelukast (SINGULAIR) 10 MG tablet TAKE 1 TABLET BY MOUTH AT BEDTIME. OFFICE VISIT NEEDED FOR ADDITIONAL REFILLS ?Patient taking differently: Take 10 mg by mouth at bedtime. 02/27/21   [name], MD  ?naloxone [facility]) nasal spray 4 mg/0.1 mL Place 1 spray into the  nose once as needed (opioid overdose). ?Patient not taking: Reported on 08/23/2021 08/09/20   [provider]  ?omeprazole (PRILOSEC) 20 MG capsule Take 40 mg by mouth daily.    [provider]  ?oxyCODONE-acetaminophen (PERCOCET) 10-325 MG tablet Take 1 tablet by mouth 5 (five) times daily as needed (nerve pain). 08/01/21   [provider]  ?phentermine 15 MG capsule Take 15 mg by mouth daily. 08/10/21   [provider]  ?potassium chloride SA (KLOR-CON M) 20 MEQ tablet Take 1 tablet (20 mEq total) by mouth  daily. 06/06/21   [name], MD  ?predniSONE (DELTASONE) 10 MG tablet 1-2 tabs daily only when needed ?Patient taking differently: Take '10mg'$  to '20mg'$  (1-2 tablets) daily only when needed for difficulty breathing 07/31/21   Baird Lyons D, MD  ?pregabalin (LYRICA) 300 MG capsule Take 300 mg by mouth 2 (two) times daily as needed (pain). 01/08/21   [provider]  ?Semaglutide, 1 MG/DOSE, (OZEMPIC, 1 MG/DOSE,) 4 MG/3ML SOPN Inject 1 mg into the skin every Monday.    [provider]  ?tezepelumab-ekko (TEZSPIRE) 210 MG/1.91ML syringe Inject 210 mg into the skin every 28 (twenty-eight) days. 04/12/21   [provider]  ?topiramate (TOPAMAX) 200 MG tablet Take 200 mg by mouth at bedtime. 01/08/21   [provider]  ?zolpidem (AMBIEN) 10 MG tablet TAKE 1 TABLET BY MOUTH AT BEDTIME AS NEEDED FOR SLEEP ?Patient taking differently: Take 10 mg by mouth at bedtime as needed for sleep. 07/25/21   Deneise Lever, MD  ?mometasone-formoterol [facility]) 200-5 MCG/ACT AERO Inhale 2 puffs into the lungs 2 (two) times daily. Dx: Asthma 04/08/19 08/12/19  Deneise Lever, MD  ?   ? ?Allergies    ?Contrast media [iodinated contrast media]   ? ?Review of Systems   ?Review of Systems  ?All other systems reviewed and are negative. ? ?Physical Exam ?Updated Vital Signs ?BP (!) 163/150   Pulse 78   Temp 98 ?F (36.7 ?C) (Oral)   Resp 19   LMP 08/11/2021 (Exact Date)   SpO2 93%  ?Physical Exam ?Vitals and nursing note reviewed.  ?Constitutional:   ?   Appearance: She is well-developed.  ?HENT:  ?   Head: Atraumatic.  ?Cardiovascular:  ?   Rate and Rhythm: Normal rate.  ?Pulmonary:  ?   Effort: Pulmonary effort is normal. Tachypnea present.  ?   Breath sounds: Wheezing and rhonchi present. No rales.  ?Musculoskeletal:  ?   Cervical back: Normal range of motion and neck supple.  ?Skin: ?   General: Skin is warm and dry.  ?Neurological:  ?   Mental Status: She is alert and oriented to person, place, and  time.  ? ? ?ED Results / Procedures / Treatments   ?Labs ?(all labs ordered are listed, but only abnormal results are displayed) ?Labs Reviewed  ?CBC WITH DIFFERENTIAL/PLATELET - Abnormal; Notable for the following components:  ?    Result Value  ? WBC 16.7 (*)   ? MCH 25.6 (*)   ? RDW 19.6 (*)   ? Platelets 418 (*)   ? Neutro Abs 9.9 (*)   ? Lymphs Abs 4.7 (*)   ? Monocytes Absolute 1.5 (*)   ? Abs Immature Granulocytes 0.09 (*)   ? All other components within normal limits  ?RESP PANEL BY RT-PCR (FLU A&B, COVID) [id number]  ?BASIC METABOLIC PANEL  ?BRAIN NATRIURETIC PEPTIDE  ? ? ?EKG ?EKG Interpretation ? ?Date/Time:  Sunday August 26 2021  15:38:56 EDT ?Ventricular Rate:  76 ?PR Interval:  140 ?QRS Duration: 88 ?QT Interval:  406 ?QTC Calculation: 457 ?R Axis:   -59 ?Text Interpretation: Sinus rhythm Left anterior fascicular block Anterior infarct, old No acute changes No significant change since last tracing Confirmed by Varney Biles [phone]) on 08/26/2021 4:03:53 PM ? ?Radiology ?DG Chest 2 View ? ?Result Date: 08/26/2021 ?CLINICAL DATA:  Acute shortness of breath EXAM: CHEST - 2 VIEW COMPARISON:  08/23/2021 and prior radiographs FINDINGS: The cardiomediastinal silhouette is unremarkable. Mild peribronchial thickening is unchanged. Mild pulmonary vascular congestion noted. There is no evidence of focal airspace disease, pulmonary edema, suspicious pulmonary nodule/mass, pleural effusion, or pneumothorax. No acute bony abnormalities are identified. IMPRESSION: Mild pulmonary vascular congestion. Electronically Signed   By: Margarette Canada M.D.   On: 08/26/2021 16:23   ? ?Procedures ?Marland KitchenCritical Care ?Performed by: Varney Biles, MD ?Authorized by: Varney Biles, MD  ? ?Critical care provider statement:  ?  Critical care time (minutes):  32 ?  Critical care was necessary to treat or prevent imminent or life-threatening deterioration of the following conditions:  Respiratory failure ?  Critical care was time spent  personally by me on the following activities:  Development of treatment plan with patient or surrogate, discussions with consultants, evaluation of patient's response to treatment, examination of patient, ordering and r

## 2021-08-26 NOTE — H&P (Signed)
?History and Physical  ? ? ?April [name] QJJ:[id number] DOB: 04/19/1971 DOA: 08/26/2021 ? ?PCP: [name], MD ? ?Patient coming from: Home ? ?Chief Complaint: Shortness of breath ? ?HPI: April [name] is a 51 y.o. female with medical history significant of severe persistent asthma, paroxysmal A-fib on Eliquis, chronic diastolic CHF, hypertension, hyperlipidemia, hypothyroidism, depression, anxiety, OSA nonadherent to CPAP, rheumatoid arthritis, class III obesity (BMI 52.70).  Recently admitted 4/27 for acute asthma exacerbation and left AMA after a few hours.  She presents to the ED today with worsening shortness of breath and wheezing.  Afebrile.  WBC 16.7.  Platelet count mildly elevated and stable.  COVID and flu negative.  BNP normal. Chest x-ray showing mild pulmonary vascular congestion. Patient was given IV Solu-Medrol 125 mg and bronchodilator treatments including continuous albuterol neb. ? ?Patient states her breathing has continued to worsen since she left the hospital.  Endorsing wheezing and mild cough.  No fevers.  She is using Trelegy and albuterol as needed.  Also receiving monthly Tezspire injections.  She uses 4 to 5 L supplemental oxygen at night.  She is requesting her home pain and sleep medications.  Requesting regular diet. ? ?Review of Systems:  ?Review of Systems  ?All other systems reviewed and are negative. ? ?Past Medical History:  ?Diagnosis Date  ? Acanthosis nigricans   ? Anxiety   ? Arthritis   ? "knees" (04/28/2017)  ? Asthma   ? Followed by Dr. Annamaria Boots (pulmonology); receives every other week omalizumab injections; has frequent exacerbations  ? Back pain   ? Chronic diastolic CHF (congestive heart failure) ([place]) 01/17/2017  ? COPD (chronic obstructive pulmonary disease) ([place])   ? PFTs in 2002, FEV1/FVC 65, no post bronchodilater test done  ? Depression   ? GERD (gastroesophageal reflux disease)   ? Headache(784.0)   ? "q couple days" (04/28/2017)  ? Helicobacter pylori (H. pylori)  infection   ? Hypertension, essential   ? Insomnia   ? Joint pain   ? Lower extremity edema   ? Menorrhagia   ? Morbid obesity ([place])   ? OSA on CPAP   ? Sleep study 2008 - mild OSA, not enough events to titrate CPAP; wears CPAP now/pt on 04/28/2017  ? Pneumonia X 1  ? Prediabetes   ? Rheumatoid arthritis ([place])   ? Seasonal allergies   ? Shortness of breath   ? Tobacco user   ? Vitamin D deficiency   ? ? ?Past Surgical History:  ?Procedure Laterality Date  ? CARDIOVERSION N/A 05/30/2017  ? Procedure: CARDIOVERSION;  Surgeon: Sanda Klein, MD;  Location: [facility];  Service: Cardiovascular;  Laterality: N/A;  ? REDUCTION MAMMAPLASTY Bilateral 09/2011  ? TUBAL LIGATION  1996  ? bilateral  ? ? ? reports that she quit smoking about 6 years ago. Her smoking use included cigarettes. She has a 13.00 pack-year smoking history. She has never used smokeless tobacco. She reports that she does not drink alcohol and does not use drugs. ? ?Allergies  ?Allergen Reactions  ? Contrast Media [Iodinated Contrast Media] Itching  ?  Ct contrast  ? ? ?Family History  ?Problem Relation Age of Onset  ? Hypertension Mother   ? Asthma Daughter   ? Cancer Paternal Aunt   ? Asthma Maternal Grandmother   ? ? ?Prior to Admission medications   ?Medication Sig Start Date End Date Taking? Authorizing Provider  ?albuterol (PROAIR HFA) 108 (90 Base) MCG/ACT inhaler Inhale 2 puffs into the lungs every  6 (six) hours as needed for wheezing or shortness of breath. 06/26/21  Yes Young, Tarri Fuller D, MD  ?amLODipine (NORVASC) 10 MG tablet Take 1 tablet (10 mg total) by mouth daily. 06/06/21  Yes [name], MD  ?apixaban (ELIQUIS) 5 MG TABS tablet Take 1 tablet (5 mg total) by mouth 2 (two) times daily. 06/06/21  Yes [name], MD  ?atorvastatin (LIPITOR) 20 MG tablet Take 20 mg by mouth every morning. 10/02/20  Yes [provider]  ?baclofen (LIORESAL) 20 MG tablet Take 20 mg by mouth daily as needed for muscle spasms (pain). 01/08/21  Yes  [provider]  ?carvedilol (COREG) 6.25 MG tablet Take 1 tablet (6.25 mg total) by mouth 2 (two) times daily with a meal. 06/06/21  Yes Newlin, Enobong, MD  ?cloNIDine (CATAPRES) 0.2 MG tablet Take 0.2 mg by mouth 2 (two) times daily.   Yes [provider]  ?Diclofenac Sodium 3 % GEL Apply 1 application. topically daily as needed (pain). 08/03/21  Yes [provider]  ?DULoxetine (CYMBALTA) 60 MG capsule Take 1 capsule (60 mg total) by mouth daily. 06/06/21  Yes [name], MD  ?EPINEPHrine 0.3 mg/0.3 mL IJ SOAJ injection Inject 0.3 mg into the muscle as needed for anaphylaxis. 05/30/20  Yes Young, Tarri Fuller D, MD  ?fluticasone (FLONASE) 50 MCG/ACT nasal spray SHAKE LIQUID AND USE 1 SPRAY IN EACH NOSTRIL DAILY AS NEEDED FOR ALLERGIES OR RHINITIS ?Patient taking differently: 1 spray daily. 08/05/21  Yes [name], MD  ?Fluticasone-Umeclidin-Vilant (TRELEGY ELLIPTA) 100-62.5-25 MCG/INH AEPB Inhale 1 puff into the lungs daily. 01/23/21  Yes Young, Tarri Fuller D, MD  ?furosemide (LASIX) 40 MG tablet Take 1 tablet (40 mg total) by mouth daily. 02/23/20  Yes [name], MD  ?Ipratropium-Albuterol (COMBIVENT) 20-100 MCG/ACT AERS respimat Inhale 1 puff, every 6 hours as needed, rescue ?Patient taking differently: 1 puff every 6 (six) hours as needed for wheezing or shortness of breath. 07/31/21  Yes Young, Tarri Fuller D, MD  ?ipratropium-albuterol (DUONEB) 0.5-2.5 (3) MG/3ML SOLN Take 3 mLs by nebulization every 6 (six) hours as needed (shortness of breath/wheezing). 02/15/21  Yes Young, Tarri Fuller D, MD  ?levothyroxine (SYNTHROID) 50 MCG tablet Take 50 mcg by mouth daily. 10/15/20  Yes [provider]  ?montelukast (SINGULAIR) 10 MG tablet TAKE 1 TABLET BY MOUTH AT BEDTIME. OFFICE VISIT NEEDED FOR ADDITIONAL REFILLS ?Patient taking differently: Take 10 mg by mouth at bedtime. 02/27/21  Yes [name], MD  ?naloxone (NARCAN) nasal spray 4 mg/0.1 mL Place 1 spray into the nose once as needed  (opioid overdose). 08/09/20  Yes [provider]  ?omeprazole (PRILOSEC) 40 MG capsule Take 40 mg by mouth daily.   Yes [provider]  ?oxyCODONE-acetaminophen (PERCOCET) 10-325 MG tablet Take 1 tablet by mouth 5 (five) times daily as needed (nerve pain). 08/01/21  Yes [provider]  ?phentermine 15 MG capsule Take 15 mg by mouth daily. 08/10/21  Yes [provider]  ?potassium chloride SA (KLOR-CON M) 20 MEQ tablet Take 1 tablet (20 mEq total) by mouth daily. 06/06/21  Yes [name], MD  ?predniSONE (DELTASONE) 10 MG tablet 1-2 tabs daily only when needed ?Patient taking differently: Take '10mg'$  to '20mg'$  (1-2 tablets) daily only when needed for difficulty breathing 07/31/21  Yes Young, Tarri Fuller D, MD  ?pregabalin (LYRICA) 300 MG capsule Take 300 mg by mouth 2 (two) times daily. 01/08/21  Yes [provider]  ?Semaglutide, 1 MG/DOSE, (OZEMPIC, 1 MG/DOSE,) 4 MG/3ML SOPN Inject 1 mg into  the skin every Monday.   Yes [provider]  ?tezepelumab-ekko (TEZSPIRE) 210 MG/1.91ML syringe Inject 210 mg into the skin every 28 (twenty-eight) days. 04/12/21  Yes [provider]  ?topiramate (TOPAMAX) 200 MG tablet Take 200 mg by mouth at bedtime. 01/08/21  Yes [provider]  ?zolpidem (AMBIEN) 10 MG tablet TAKE 1 TABLET BY MOUTH AT BEDTIME AS NEEDED FOR SLEEP ?Patient taking differently: Take 10 mg by mouth at bedtime. 07/25/21  Yes Young, Tarri Fuller D, MD  ?cloNIDine (CATAPRES) 0.2 MG tablet Take 1 tablet (0.2 mg total) by mouth 2 (two) times daily. 05/26/21 08/24/21  Terrilee Croak, MD  ?mometasone-formoterol (DULERA) 200-5 MCG/ACT AERO Inhale 2 puffs into the lungs 2 (two) times daily. Dx: Asthma 04/08/19 08/12/19  Deneise Lever, MD  ? ? ?Physical Exam: ?Vitals:  ? 08/26/21 1830 08/26/21 1900 08/26/21 1953 08/26/21 2030  ?BP: (!) 161/127 (!) 163/150 (!) 171/104 (!) 141/127  ?Pulse: 86 78 76 79  ?Resp: (!) 21 19 (!) 24 (!) 23  ?Temp:   98.7 ?F (37.1 ?C) 98.6 ?F  (37 ?C)  ?TempSrc:   Oral Oral  ?SpO2: 95% 93% 100% 100%  ? ? ?Physical Exam ?Vitals reviewed.  ?Constitutional:   ?   General: She is not in acute distress. ?HENT:  ?   Head: Normocephalic and atraumatic.

## 2021-08-27 ENCOUNTER — Other Ambulatory Visit: Payer: Self-pay | Admitting: Family Medicine

## 2021-08-27 DIAGNOSIS — G4733 Obstructive sleep apnea (adult) (pediatric): Secondary | ICD-10-CM | POA: Diagnosis present

## 2021-08-27 DIAGNOSIS — Z79899 Other long term (current) drug therapy: Secondary | ICD-10-CM | POA: Diagnosis not present

## 2021-08-27 DIAGNOSIS — I10 Essential (primary) hypertension: Secondary | ICD-10-CM | POA: Diagnosis not present

## 2021-08-27 DIAGNOSIS — Z6841 Body Mass Index (BMI) 40.0 and over, adult: Secondary | ICD-10-CM | POA: Diagnosis not present

## 2021-08-27 DIAGNOSIS — J4542 Moderate persistent asthma with status asthmaticus: Secondary | ICD-10-CM | POA: Diagnosis present

## 2021-08-27 DIAGNOSIS — Z7951 Long term (current) use of inhaled steroids: Secondary | ICD-10-CM | POA: Diagnosis not present

## 2021-08-27 DIAGNOSIS — I480 Paroxysmal atrial fibrillation: Secondary | ICD-10-CM | POA: Diagnosis present

## 2021-08-27 DIAGNOSIS — T380X5A Adverse effect of glucocorticoids and synthetic analogues, initial encounter: Secondary | ICD-10-CM | POA: Diagnosis present

## 2021-08-27 DIAGNOSIS — K219 Gastro-esophageal reflux disease without esophagitis: Secondary | ICD-10-CM | POA: Diagnosis present

## 2021-08-27 DIAGNOSIS — I5023 Acute on chronic systolic (congestive) heart failure: Secondary | ICD-10-CM | POA: Diagnosis not present

## 2021-08-27 DIAGNOSIS — D72829 Elevated white blood cell count, unspecified: Secondary | ICD-10-CM | POA: Diagnosis present

## 2021-08-27 DIAGNOSIS — E039 Hypothyroidism, unspecified: Secondary | ICD-10-CM | POA: Diagnosis present

## 2021-08-27 DIAGNOSIS — F32A Depression, unspecified: Secondary | ICD-10-CM | POA: Diagnosis present

## 2021-08-27 DIAGNOSIS — J449 Chronic obstructive pulmonary disease, unspecified: Secondary | ICD-10-CM | POA: Diagnosis present

## 2021-08-27 DIAGNOSIS — G8929 Other chronic pain: Secondary | ICD-10-CM | POA: Diagnosis present

## 2021-08-27 DIAGNOSIS — Z20822 Contact with and (suspected) exposure to covid-19: Secondary | ICD-10-CM | POA: Diagnosis present

## 2021-08-27 DIAGNOSIS — J4552 Severe persistent asthma with status asthmaticus: Secondary | ICD-10-CM | POA: Diagnosis present

## 2021-08-27 DIAGNOSIS — E785 Hyperlipidemia, unspecified: Secondary | ICD-10-CM | POA: Diagnosis present

## 2021-08-27 DIAGNOSIS — J9621 Acute and chronic respiratory failure with hypoxia: Secondary | ICD-10-CM | POA: Diagnosis present

## 2021-08-27 DIAGNOSIS — Z9851 Tubal ligation status: Secondary | ICD-10-CM | POA: Diagnosis not present

## 2021-08-27 DIAGNOSIS — I110 Hypertensive heart disease with heart failure: Secondary | ICD-10-CM | POA: Diagnosis present

## 2021-08-27 DIAGNOSIS — Z7901 Long term (current) use of anticoagulants: Secondary | ICD-10-CM | POA: Diagnosis not present

## 2021-08-27 DIAGNOSIS — G4700 Insomnia, unspecified: Secondary | ICD-10-CM | POA: Diagnosis present

## 2021-08-27 DIAGNOSIS — J4551 Severe persistent asthma with (acute) exacerbation: Secondary | ICD-10-CM | POA: Diagnosis not present

## 2021-08-27 DIAGNOSIS — I152 Hypertension secondary to endocrine disorders: Secondary | ICD-10-CM

## 2021-08-27 DIAGNOSIS — M069 Rheumatoid arthritis, unspecified: Secondary | ICD-10-CM | POA: Diagnosis present

## 2021-08-27 DIAGNOSIS — I5033 Acute on chronic diastolic (congestive) heart failure: Secondary | ICD-10-CM | POA: Diagnosis present

## 2021-08-27 DIAGNOSIS — F419 Anxiety disorder, unspecified: Secondary | ICD-10-CM | POA: Diagnosis present

## 2021-08-27 LAB — CBC
HCT: 39.2 % (ref 36.0–46.0)
Hemoglobin: 12.4 g/dL (ref 12.0–15.0)
MCH: 25.6 pg — ABNORMAL LOW (ref 26.0–34.0)
MCHC: 31.6 g/dL (ref 30.0–36.0)
MCV: 80.8 fL (ref 80.0–100.0)
Platelets: 422 10*3/uL — ABNORMAL HIGH (ref 150–400)
RBC: 4.85 MIL/uL (ref 3.87–5.11)
RDW: 19 % — ABNORMAL HIGH (ref 11.5–15.5)
WBC: 16 10*3/uL — ABNORMAL HIGH (ref 4.0–10.5)
nRBC: 0 % (ref 0.0–0.2)

## 2021-08-27 LAB — GLUCOSE, CAPILLARY
Glucose-Capillary: 132 mg/dL — ABNORMAL HIGH (ref 70–99)
Glucose-Capillary: 129 mg/dL — ABNORMAL HIGH (ref 70–99)

## 2021-08-27 LAB — BASIC METABOLIC PANEL
Anion gap: 5 (ref 5–15)
BUN: 13 mg/dL (ref 6–20)
CO2: 28 mmol/L (ref 22–32)
Calcium: 9 mg/dL (ref 8.9–10.3)
Chloride: 103 mmol/L (ref 98–111)
Creatinine, Ser: 0.81 mg/dL (ref 0.44–1.00)
GFR, Estimated: >60 mL/min (ref >60)
Glucose, Bld: 152 mg/dL — ABNORMAL HIGH (ref 70–99)
Potassium: 4.2 mmol/L (ref 3.5–5.1)
Sodium: 136 mmol/L (ref 135–145)

## 2021-08-27 MED ORDER — ALPRAZOLAM 0.25 MG PO TABS
0.2500 mg | ORAL_TABLET | Freq: Two times a day (BID) | ORAL | Status: DC | PRN
  Administered 2021-08-27 – 2021-08-30 (×7): 0.25 mg via ORAL
  Filled 2021-08-27 (×7): qty 1

## 2021-08-27 MED ORDER — AMLODIPINE BESYLATE 10 MG PO TABS: 10.0000 mg | ORAL_TABLET | Freq: Every day | ORAL | Status: DC

## 2021-08-27 MED ORDER — INSULIN ASPART 100 UNIT/ML IJ SOLN
0.0000 [IU] | Freq: Every day | INTRAMUSCULAR | Status: DC
  Administered 2021-08-28: 2 [IU] via SUBCUTANEOUS

## 2021-08-27 MED ORDER — INSULIN ASPART 100 UNIT/ML IJ SOLN
0.0000 [IU] | Freq: Three times a day (TID) | INTRAMUSCULAR | Status: DC
  Administered 2021-08-27: 2 [IU] via SUBCUTANEOUS
  Administered 2021-08-28: 3 [IU] via SUBCUTANEOUS
  Administered 2021-08-28 – 2021-08-29 (×5): 2 [IU] via SUBCUTANEOUS
  Administered 2021-08-30: 5 [IU] via SUBCUTANEOUS

## 2021-08-27 MED ORDER — FUROSEMIDE 40 MG PO TABS
40.0000 mg | ORAL_TABLET | Freq: Every day | ORAL | Status: DC
  Administered 2021-08-27 – 2021-08-30 (×4): 40 mg via ORAL
  Filled 2021-08-27 (×4): qty 1

## 2021-08-27 NOTE — Progress Notes (Signed)
Pt continues to decline use of CPAP QHS.  ?

## 2021-08-27 NOTE — Progress Notes (Signed)
Pt refusing CPAP QHS.  ?

## 2021-08-27 NOTE — Progress Notes (Signed)
?PROGRESS NOTE ? ? ? ?April [name]  JXB:[id number] DOB: 1971-01-10 DOA: 08/26/2021 ?PCP: [name], MD ? ? ?Brief Narrative:  ?HPI: April [name] is a 51 y.o. female with medical history significant of severe persistent asthma, paroxysmal A-fib on Eliquis, chronic diastolic CHF, hypertension, hyperlipidemia, hypothyroidism, depression, anxiety, OSA nonadherent to CPAP, rheumatoid arthritis, class III obesity (BMI 52.70).  Recently admitted 4/27 for acute asthma exacerbation and left AMA after a few hours.  She presents to the ED today with worsening shortness of breath and wheezing.  Afebrile.  WBC 16.7.  Platelet count mildly elevated and stable.  COVID and flu negative.  BNP normal. Chest x-ray showing mild pulmonary vascular congestion. Patient was given IV Solu-Medrol 125 mg and bronchodilator treatments including continuous albuterol neb. ?  ?Patient states her breathing has continued to worsen since she left the hospital.  Endorsing wheezing and mild cough.  No fevers.  She is using Trelegy and albuterol as needed.  Also receiving monthly Tezspire injections.  She uses 4 to 5 L supplemental oxygen at night.  She is requesting her home pain and sleep medications.  Requesting regular diet. ? ?Assessment & Plan: ?  ?Principal Problem: ?  Acute asthma exacerbation ?Active Problems: ?  HTN (hypertension) ?  AF (paroxysmal atrial fibrillation) ([place]) ?  Anxiety and depression ?  CHF exacerbation ([place]) ? ?Acute asthma exacerbation/acute on chronic hypoxic respiratory failure: Uses 4 to 5 L of oxygen at night every night and sometimes during the day.  Was requiring 10 L of oxygen upon presentation.  Currently down to 4 L which is her baseline. ?History of severe persistent asthma on monthly Tezspire injections.  She was admitted 3 days ago and left AMA within a few hours after admission, presented back on 08/26/2021 with worsening dyspnea and wheezing.  COVID and flu negative.  Continue Solu-Medrol, montelukast and  albuterol and pleasant. ?  ?Acute on chronic diastolic CHF ?Echo done 05/25/2021 showing EF 65 to 70%.  Chest x-ray showing mild pulmonary vascular congestion.  BNP likely falsely normal in the setting of class III obesity.  Received 1 dose of IV Lasix yesterday.  Now appears improved.  I will resume her home dose of Lasix. ? ?Leukocytosis ?Likely reactive from steroid use.  No signs of infection. ?-Continue to monitor ?  ?Paroxysmal A-fib ?Currently in sinus rhythm. ?-Continue Eliquis and Coreg ?  ?Hypertension: Blood pressure fairly controlled. ?-Continue amlodipine, Coreg, and clonidine. ?-Order prn antihypertensive if blood pressure continues to be elevated ?  ?Hyperlipidemia ?-Continue Lipitor ?  ?Hypothyroidism ?-Continue Synthroid ?  ?Depression and anxiety ?-Continue home meds ?  ?OSA: Does not use nightly CPAP.  Only uses oxygen. ?  ?Class III obesity: Weight loss and dietary modification advised. ?  ?Insomnia ?-Continue Ambien ?  ?Chronic pain ?-Continue home Lyrica, baclofen prn, Percocet prn ? ?Anxiety: Patient is requesting something for anxiety.  Will order Xanax as needed. ? ?DVT prophylaxis:  ?  Code Status: Full Code  ?Family Communication:  None present at bedside.  Plan of care discussed with patient in length and he/she verbalized understanding and agreed with it. ? ?Status is: Observation ?The patient will require care spanning > 2 midnights and should be moved to inpatient because: Still with significant wheezes and symptomatic. ? ? ?Estimated body mass index is 52.7 kg/m? as calculated from the following: ?  Height as of 08/24/21: '5\' 6"'$  (1.676 m). ?  Weight as of 08/24/21: 148.1 kg. ? ?  ?Nutritional Assessment: ?There  is no height or weight on file to calculate BMI.[name] ?Seen by dietician.  I agree with the assessment and plan as outlined below: ?Nutrition Status: ?  ?  ?  ? ?. ?Skin Assessment: ?I have examined the patient's skin and I agree with the wound assessment as performed by the wound care  RN as outlined below: ?  ? ?Consultants:  ?None ? ?Procedures:  ?None ? ?Antimicrobials:  ?Anti-infectives (From admission, onward)  ? ? None  ? ?  ?  ? ? ?Subjective: ?Patient seen and examined.  She states that her breathing is slightly better than yesterday but is still not back to baseline.  No other complaint.  She is requesting something for anxiety.  She says that she has decided to stay is not as needed and will not leave AMA this time. ? ?Objective: ?Vitals:  ? 08/27/21 2229 08/27/21 7989 08/27/21 0845 08/27/21 0942  ?BP: 137/74  (!) 147/77 (!) 149/72  ?Pulse: 68   77  ?Resp:    20  ?Temp: 97.9 ?F (36.6 ?C)   98.2 ?F (36.8 ?C)  ?TempSrc: Oral   Oral  ?SpO2: 100% 99%  99%  ? ? ?Intake/Output Summary (Last 24 hours) at 08/27/2021 1227 ?Last data filed at 08/27/2021 0948 ?Gross per 24 hour  ?Intake 877 ml  ?Output --  ?Net 877 ml  ? ?There were no vitals filed for this visit. ? ?Examination: ? ?General exam: Appears calm and comfortable, morbidly obese ?Respiratory system: Diffuse expiratory wheezes with diminished breath sounds at the bases bilaterally ?Cardiovascular system: S1 & S2 heard, RRR. No JVD, murmurs, rubs, gallops or clicks.  Trace pitting edema bilateral lower extremity ?Gastrointestinal system: Abdomen is nondistended, soft and nontender. No organomegaly or masses felt. Normal bowel sounds heard. ?Central nervous system: Alert and oriented. No focal neurological deficits. ?Extremities: Symmetric 5 x 5 power. ?Skin: No rashes, lesions or ulcers ?Psychiatry: Judgement and insight appear normal. Mood & affect appropriate.  ? ? ?Data Reviewed: I have personally reviewed following labs and imaging studies ? ?CBC: ?Recent Labs  ?Lab 08/23/21 ?1620 08/26/21 ?1621 08/27/21 ?0433  ?WBC 17.9* 16.7* 16.0*  ?NEUTROABS 16.2* 9.9*  --   ?HGB 12.0 12.6 12.4  ?HCT 37.8 40.3 39.2  ?MCV 81.1 81.7 80.8  ?PLT 420* 418* 422*  ? ?Basic Metabolic Panel: ?Recent Labs  ?Lab 08/23/21 ?1620 08/26/21 ?1621 08/27/21 ?0433  ?NA  136 139 136  ?K 4.2 3.7 4.2  ?CL 103 104 103  ?CO2 '27 28 28  '$ ?GLUCOSE 122* 94 152*  ?BUN '8 13 13  '$ ?CREATININE 0.73 0.91 0.81  ?CALCIUM 8.7* 9.1 9.0  ? ?GFR: ?Estimated Creatinine Clearance: 127.1 mL/min (by C-G formula based on SCr of 0.81 mg/dL). ?Liver Function Tests: ?Recent Labs  ?Lab 08/23/21 ?1620  ?AST 16  ?ALT 11  ?ALKPHOS 68  ?BILITOT 0.3  ?PROT 7.1  ?ALBUMIN 3.3*  ? ?No results for input(s): LIPASE, AMYLASE in the last 168 hours. ?No results for input(s): AMMONIA in the last 168 hours. ?Coagulation Profile: ?No results for input(s): INR, PROTIME in the last 168 hours. ?Cardiac Enzymes: ?No results for input(s): CKTOTAL, CKMB, CKMBINDEX, TROPONINI in the last 168 hours. ?BNP (last 3 results) ?No results for input(s): PROBNP in the last 8760 hours. ?HbA1C: ?No results for input(s): HGBA1C in the last 72 hours. ?CBG: ?No results for input(s): GLUCAP in the last 168 hours. ?Lipid Profile: ?No results for input(s): CHOL, HDL, LDLCALC, TRIG, CHOLHDL, LDLDIRECT in the last 72 hours. ?Thyroid Function  Tests: ?No results for input(s): TSH, T4TOTAL, FREET4, T3FREE, THYROIDAB in the last 72 hours. ?Anemia Panel: ?No results for input(s): VITAMINB12, FOLATE, FERRITIN, TIBC, IRON, RETICCTPCT in the last 72 hours. ?Sepsis Labs: ?No results for input(s): PROCALCITON, LATICACIDVEN in the last 168 hours. ? ?Recent Results (from the past 240 hour(s))  ?Resp Panel by RT-PCR (Flu A&B, Covid) Nasopharyngeal Swab     Status: None  ? Collection Time: 08/23/21  4:20 PM  ? Specimen: Nasopharyngeal Swab; Nasopharyngeal(NP) swabs in vial transport medium  ?Result Value Ref Range Status  ? SARS Coronavirus 2 by RT PCR NEGATIVE NEGATIVE Final  ?  Comment: (NOTE) ?SARS-CoV-2 target nucleic acids are NOT DETECTED. ? ?The SARS-CoV-2 RNA is generally detectable in upper respiratory ?specimens during the acute phase of infection. The lowest ?concentration of SARS-CoV-2 viral copies this assay can detect is ?138 copies/mL. A negative result  does not preclude SARS-Cov-2 ?infection and should not be used as the sole basis for treatment or ?other patient management decisions. A negative result may occur with  ?improper specimen collection/h

## 2021-08-28 ENCOUNTER — Other Ambulatory Visit: Payer: Self-pay | Admitting: Family Medicine

## 2021-08-28 DIAGNOSIS — I480 Paroxysmal atrial fibrillation: Secondary | ICD-10-CM

## 2021-08-28 DIAGNOSIS — J4551 Severe persistent asthma with (acute) exacerbation: Secondary | ICD-10-CM | POA: Diagnosis not present

## 2021-08-28 DIAGNOSIS — E1159 Type 2 diabetes mellitus with other circulatory complications: Secondary | ICD-10-CM

## 2021-08-28 LAB — CBC WITH DIFFERENTIAL/PLATELET
Abs Immature Granulocytes: 0.16 10*3/uL — ABNORMAL HIGH (ref 0.00–0.07)
Basophils Absolute: 0 10*3/uL (ref 0.0–0.1)
Basophils Relative: 0 %
Eosinophils Absolute: 0 10*3/uL (ref 0.0–0.5)
Eosinophils Relative: 0 %
HCT: 38 % (ref 36.0–46.0)
Hemoglobin: 11.4 g/dL — ABNORMAL LOW (ref 12.0–15.0)
Immature Granulocytes: 1 %
Lymphocytes Relative: 12 %
Lymphs Abs: 2.6 10*3/uL (ref 0.7–4.0)
MCH: 25 pg — ABNORMAL LOW (ref 26.0–34.0)
MCHC: 30 g/dL (ref 30.0–36.0)
MCV: 83.3 fL (ref 80.0–100.0)
Monocytes Absolute: 1.7 10*3/uL — ABNORMAL HIGH (ref 0.1–1.0)
Monocytes Relative: 8 %
Neutro Abs: 17.1 10*3/uL — ABNORMAL HIGH (ref 1.7–7.7)
Neutrophils Relative %: 79 %
Platelets: 404 10*3/uL — ABNORMAL HIGH (ref 150–400)
RBC: 4.56 MIL/uL (ref 3.87–5.11)
RDW: 18.5 % — ABNORMAL HIGH (ref 11.5–15.5)
WBC: 21.4 10*3/uL — ABNORMAL HIGH (ref 4.0–10.5)
nRBC: 0 % (ref 0.0–0.2)

## 2021-08-28 LAB — GLUCOSE, CAPILLARY
Glucose-Capillary: 156 mg/dL — ABNORMAL HIGH (ref 70–99)
Glucose-Capillary: 137 mg/dL — ABNORMAL HIGH (ref 70–99)
Glucose-Capillary: 192 mg/dL — ABNORMAL HIGH (ref 70–99)
Glucose-Capillary: 216 mg/dL — ABNORMAL HIGH (ref 70–99)

## 2021-08-28 LAB — BASIC METABOLIC PANEL
Anion gap: 6 (ref 5–15)
BUN: 19 mg/dL (ref 6–20)
CO2: 25 mmol/L (ref 22–32)
Calcium: 8.8 mg/dL — ABNORMAL LOW (ref 8.9–10.3)
Chloride: 107 mmol/L (ref 98–111)
Creatinine, Ser: 0.88 mg/dL (ref 0.44–1.00)
GFR, Estimated: >60 mL/min (ref >60)
Glucose, Bld: 136 mg/dL — ABNORMAL HIGH (ref 70–99)
Potassium: 4.2 mmol/L (ref 3.5–5.1)
Sodium: 138 mmol/L (ref 135–145)

## 2021-08-28 MED ORDER — CALCIUM CARBONATE ANTACID 500 MG PO CHEW
1.0000 | CHEWABLE_TABLET | Freq: Once | ORAL | Status: AC
  Administered 2021-08-28: 200 mg via ORAL
  Filled 2021-08-28: qty 1

## 2021-08-28 NOTE — Progress Notes (Signed)
?PROGRESS NOTE ? ? ? ?[name]  NWG:[id number] DOB: 03/11/1971 DOA: 08/26/2021 ?PCP: [name], MD ? ? ?Brief Narrative:  ?HPI: April [name] is a 51 y.o. female with medical history significant of severe persistent asthma, paroxysmal A-fib on Eliquis, chronic diastolic CHF, hypertension, hyperlipidemia, hypothyroidism, depression, anxiety, OSA nonadherent to CPAP, rheumatoid arthritis, class III obesity (BMI 52.70).  Recently admitted 4/27 for acute asthma exacerbation and left AMA after a few hours.  She presents to the ED today with worsening shortness of breath and wheezing.  Afebrile.  WBC 16.7.  Platelet count mildly elevated and stable.  COVID and flu negative.  BNP normal. Chest x-ray showing mild pulmonary vascular congestion. Patient was given IV Solu-Medrol 125 mg and bronchodilator treatments including continuous albuterol neb. ?  ?Patient states her breathing has continued to worsen since she left the hospital.  Endorsing wheezing and mild cough.  No fevers.  She is using Trelegy and albuterol as needed.  Also receiving monthly Tezspire injections.  She uses 4 to 5 L supplemental oxygen at night.  She is requesting her home pain and sleep medications.  Requesting regular diet. ? ?Assessment & Plan: ?  ?Principal Problem: ?  Acute asthma exacerbation ?Active Problems: ?  HTN (hypertension) ?  AF (paroxysmal atrial fibrillation) ([place]) ?  Anxiety and depression ?  CHF exacerbation ([place]) ? ?Acute asthma exacerbation/acute on chronic hypoxic respiratory failure: Uses 4 to 5 L of oxygen at night every night and sometimes during the day.  Was requiring 10 L of oxygen upon presentation.  Currently down to 4 L which is her baseline. ?History of severe persistent asthma on monthly Tezspire injections.  She was admitted 3 days ago and left AMA within a few hours after admission, presented back on 08/26/2021 with worsening dyspnea and wheezing.  COVID and flu negative.  She is improving but still says that she  is not back at baseline.  We will continue IV Solu-Medrol.  She and I hope that she will be back at baseline tomorrow and will be ready for discharge. ?  ?Acute on chronic diastolic CHF ?Echo done 05/25/2021 showing EF 65 to 70%.  Chest x-ray showing mild pulmonary vascular congestion.  BNP likely falsely normal in the setting of class III obesity.  Received 1 dose of IV Lasix upon admission.  Continue home dose of p.o. Lasix. ? ?Leukocytosis ?Likely reactive from steroid use.  No signs of infection. ?-Continue to monitor ?  ?Paroxysmal A-fib ?Currently in sinus rhythm. ?-Continue Eliquis and Coreg ?  ?Hypertension: Blood pressure fairly controlled. ?-Continue amlodipine, Coreg, and clonidine. ?-Order prn antihypertensive if blood pressure continues to be elevated ?  ?Hyperlipidemia ?-Continue Lipitor ?  ?Hypothyroidism ?-Continue Synthroid ?  ?Depression and anxiety ?-Continue home meds ?  ?OSA: Does not use nightly CPAP.  Only uses oxygen. ?  ?Class III obesity: Weight loss and dietary modification advised. ?  ?Insomnia ?-Continue Ambien ?  ?Chronic pain ?-Continue home Lyrica, baclofen prn, Percocet prn ? ?Anxiety: Controlled now.  Continue as needed Xanax while inpatient. ? ?DVT prophylaxis:  ?  Code Status: Full Code  ?Family Communication:  None present at bedside.  Plan of care discussed with patient in length and he/she verbalized understanding and agreed with it. ? ?Status is: Inpatient ?Remains inpatient appropriate because: Still needs IV steroids. ? ? ? ? ?Estimated body mass index is 52.31 kg/m? as calculated from the following: ?  Height as of this encounter: '5\' 6"'$  (1.676 m). ?  Weight as of this encounter: 147 kg. ? ?  ?Nutritional Assessment: ?Body mass index is 52.31 kg/m?Marland KitchenMarland Kitchen ?Seen by dietician.  I agree with the assessment and plan as outlined below: ?Nutrition Status: ?  ?  ?  ? ?. ?Skin Assessment: ?I have examined the patient's skin and I agree with the wound assessment as performed by the wound  care RN as outlined below: ?  ? ?Consultants:  ?None ? ?Procedures:  ?None ? ?Antimicrobials:  ?Anti-infectives (From admission, onward)  ? ? None  ? ?  ?  ? ? ?Subjective: ? ?Seen and examined.  She states that she is improving but she does not feel being back at baseline yet.  She is more comfortable staying another day. ? ?Objective: ?Vitals:  ? 08/28/21 0557 08/28/21 0602 08/28/21 0859 08/28/21 1000  ?BP:  132/74  138/78  ?Pulse:  63    ?Resp:  16    ?Temp:  98.4 ?F (36.9 ?C)    ?TempSrc:  Oral    ?SpO2:  100% 99%   ?Weight: (!) 147 kg     ?Height:      ? ? ?Intake/Output Summary (Last 24 hours) at 08/28/2021 1141 ?Last data filed at 08/28/2021 1040 ?Gross per 24 hour  ?Intake 480 ml  ?Output --  ?Net 480 ml  ? ? ?Filed Weights  ? 08/27/21 1948 08/28/21 0557  ?Weight: (!) 148.1 kg (!) 147 kg  ? ? ?Examination: ? ?General exam: Appears calm and comfortable, morbidly obese ?Respiratory system: Diffuse pan expiratory wheezes bilaterally but improved compared to yesterday. Respiratory effort normal. ?Cardiovascular system: S1 & S2 heard, RRR. No JVD, murmurs, rubs, gallops or clicks.  Trace pitting edema bilateral lower extremity. ?Gastrointestinal system: Abdomen is nondistended, soft and nontender. No organomegaly or masses felt. Normal bowel sounds heard. ?Central nervous system: Alert and oriented. No focal neurological deficits. ?Extremities: Symmetric 5 x 5 power. ?Skin: No rashes, lesions or ulcers.  ?Psychiatry: Judgement and insight appear normal. Mood & affect appropriate.  ? ?Data Reviewed: I have personally reviewed following labs and imaging studies ? ?CBC: ?Recent Labs  ?Lab 08/23/21 ?1620 08/26/21 ?1621 08/27/21 ?0433 08/28/21 ?0600  ?WBC 17.9* 16.7* 16.0* 21.4*  ?NEUTROABS 16.2* 9.9*  --  17.1*  ?HGB 12.0 12.6 12.4 11.4*  ?HCT 37.8 40.3 39.2 38.0  ?MCV 81.1 81.7 80.8 83.3  ?PLT 420* 418* 422* 404*  ? ? ?Basic Metabolic Panel: ?Recent Labs  ?Lab 08/23/21 ?1620 08/26/21 ?1621 08/27/21 ?0433 08/28/21 ?0600   ?NA 136 139 136 138  ?K 4.2 3.7 4.2 4.2  ?CL 103 104 103 107  ?CO2 '27 28 28 25  '$ ?GLUCOSE 564* 94 152* 136*  ?BUN '8 13 13 19  '$ ?CREATININE 0.73 0.91 0.81 0.88  ?CALCIUM 8.7* 9.1 9.0 8.8*  ? ? ?GFR: ?Estimated Creatinine Clearance: 116.5 mL/min (by C-G formula based on SCr of 0.88 mg/dL). ?Liver Function Tests: ?Recent Labs  ?Lab 08/23/21 ?1620  ?AST 16  ?ALT 11  ?ALKPHOS 68  ?BILITOT 0.3  ?PROT 7.1  ?ALBUMIN 3.3*  ? ? ?No results for input(s): LIPASE, AMYLASE in the last 168 hours. ?No results for input(s): AMMONIA in the last 168 hours. ?Coagulation Profile: ?No results for input(s): INR, PROTIME in the last 168 hours. ?Cardiac Enzymes: ?No results for input(s): CKTOTAL, CKMB, CKMBINDEX, TROPONINI in the last 168 hours. ?BNP (last 3 results) ?No results for input(s): PROBNP in the last 8760 hours. ?HbA1C: ?No results for input(s): HGBA1C in the last 72 hours. ?CBG: ?Recent Labs  ?  Lab 08/27/21 ?1712 08/27/21 ?2133 08/28/21 ?0750  ?GLUCAP 132* 129* 156*  ? ?Lipid Profile: ?No results for input(s): CHOL, HDL, LDLCALC, TRIG, CHOLHDL, LDLDIRECT in the last 72 hours. ?Thyroid Function Tests: ?No results for input(s): TSH, T4TOTAL, FREET4, T3FREE, THYROIDAB in the last 72 hours. ?Anemia Panel: ?No results for input(s): VITAMINB12, FOLATE, FERRITIN, TIBC, IRON, RETICCTPCT in the last 72 hours. ?Sepsis Labs: ?No results for input(s): PROCALCITON, LATICACIDVEN in the last 168 hours. ? ?Recent Results (from the past 240 hour(s))  ?Resp Panel by RT-PCR (Flu A&B, Covid) Nasopharyngeal Swab     Status: None  ? Collection Time: 08/23/21  4:20 PM  ? Specimen: Nasopharyngeal Swab; Nasopharyngeal(NP) swabs in vial transport medium  ?Result Value Ref Range Status  ? SARS Coronavirus 2 by RT PCR NEGATIVE NEGATIVE Final  ?  Comment: (NOTE) ?SARS-CoV-2 target nucleic acids are NOT DETECTED. ? ?The SARS-CoV-2 RNA is generally detectable in upper respiratory ?specimens during the acute phase of infection. The lowest ?concentration of  SARS-CoV-2 viral copies this assay can detect is ?138 copies/mL. A negative result does not preclude SARS-Cov-2 ?infection and should not be used as the sole basis for treatment or ?other patient managemen

## 2021-08-28 NOTE — Progress Notes (Signed)
Pt states she does not use cpap at night ?

## 2021-08-28 NOTE — Telephone Encounter (Signed)
Pt has 1 refill remaining 06/06/21 #180/1 ?Requested Prescriptions  ?Pending Prescriptions Disp Refills  ?? carvedilol (COREG) 6.25 MG tablet [Pharmacy Med Name: CARVEDILOL 6.'25MG'$  TABLETS] 180 tablet 1  ?  Sig: TAKE 1 TABLET(6.25 MG) BY MOUTH TWICE DAILY WITH A MEAL  ?  ? Cardiovascular: Beta Blockers 3 Passed - 08/28/2021  3:30 AM  ?  ?  Passed - Cr in normal range and within 360 days  ?  Creat  ?Date Value Ref Range Status  ?05/21/2016 0.56 0.50 - 1.10 mg/dL Final  ? ?Creatinine, Ser  ?Date Value Ref Range Status  ?08/28/2021 0.88 0.44 - 1.00 mg/dL Final  ?   ?  ?  Passed - AST in normal range and within 360 days  ?  AST  ?Date Value Ref Range Status  ?08/23/2021 16 15 - 41 U/L Final  ?   ?  ?  Passed - ALT in normal range and within 360 days  ?  ALT  ?Date Value Ref Range Status  ?08/23/2021 11 0 - 44 U/L Final  ?   ?  ?  Passed - Last BP in normal range  ?  BP Readings from Last 1 Encounters:  ?08/28/21 (!) 116/57  ?   ?  ?  Passed - Last Heart Rate in normal range  ?  Pulse Readings from Last 1 Encounters:  ?08/28/21 66  ?   ?  ?  Passed - Valid encounter within last 6 months  ?  Recent Outpatient Visits   ?      ? 2 months ago Screening for colon cancer  ? [place], Charlane Ferretti, MD  ? 6 months ago Moderate persistent asthma with acute exacerbation  ? [place] [name], MD  ? 1 year ago Encounter for Medicare annual wellness exam  ? [place], [place], MD  ? 1 year ago Hot flashes  ? [place], Charlane Ferretti, MD  ? 1 year ago Moderate persistent asthma with exacerbation  ? [place] Fulp, [place], MD  ?  ?  ? ?  ?  ?  ?? ELIQUIS 5 MG TABS tablet [Pharmacy Med Name: ELIQUIS '5MG'$  TABLETS] 180 tablet 1  ?  Sig: TAKE 1 TABLET(5 MG) BY MOUTH TWICE DAILY  ?  ? Hematology:  Anticoagulants - apixaban Failed - 08/28/2021  3:30 AM  ?  ?  Failed - PLT  in normal range and within 360 days  ?  Platelets  ?Date Value Ref Range Status  ?08/28/2021 404 (H) 150 - 400 K/uL Final  ?01/22/2018 616 (H) 150 - 450 x10E3/uL Final  ?   ?  ?  Passed - HGB in normal range and within 360 days  ?  Hemoglobin  ?Date Value Ref Range Status  ?08/28/2021 11.4 (L) 12.0 - 15.0 g/dL Final  ?01/22/2018 10.4 (L) 11.1 - 15.9 g/dL Final  ?   ?  ?  Passed - HCT in normal range and within 360 days  ?  HCT  ?Date Value Ref Range Status  ?08/28/2021 38.0 36.0 - 46.0 % Final  ? ?Hematocrit  ?Date Value Ref Range Status  ?01/22/2018 33.3 (L) 34.0 - 46.6 % Final  ?   ?  ?  Passed - Cr in normal range and within 360 days  ?  Creat  ?Date Value Ref Range Status  ?05/21/2016 0.56 0.50 -  1.10 mg/dL Final  ? ?Creatinine, Ser  ?Date Value Ref Range Status  ?08/28/2021 0.88 0.44 - 1.00 mg/dL Final  ?   ?  ?  Passed - AST in normal range and within 360 days  ?  AST  ?Date Value Ref Range Status  ?08/23/2021 16 15 - 41 U/L Final  ?   ?  ?  Passed - ALT in normal range and within 360 days  ?  ALT  ?Date Value Ref Range Status  ?08/23/2021 11 0 - 44 U/L Final  ?   ?  ?  Passed - Valid encounter within last 12 months  ?  Recent Outpatient Visits   ?      ? 2 months ago Screening for colon cancer  ? [place], Charlane Ferretti, MD  ? 6 months ago Moderate persistent asthma with acute exacerbation  ? [place] [name], MD  ? 1 year ago Encounter for Medicare annual wellness exam  ? [place], [place], MD  ? 1 year ago Hot flashes  ? [place], Charlane Ferretti, MD  ? 1 year ago Moderate persistent asthma with exacerbation  ? [place] Fulp, Ander Gaster, MD  ?  ?  ? ?  ?  ?  ? ? ?

## 2021-08-29 DIAGNOSIS — F419 Anxiety disorder, unspecified: Secondary | ICD-10-CM

## 2021-08-29 DIAGNOSIS — I10 Essential (primary) hypertension: Secondary | ICD-10-CM

## 2021-08-29 DIAGNOSIS — I480 Paroxysmal atrial fibrillation: Secondary | ICD-10-CM

## 2021-08-29 DIAGNOSIS — F32A Depression, unspecified: Secondary | ICD-10-CM

## 2021-08-29 LAB — GLUCOSE, CAPILLARY
Glucose-Capillary: 143 mg/dL — ABNORMAL HIGH (ref 70–99)
Glucose-Capillary: 141 mg/dL — ABNORMAL HIGH (ref 70–99)
Glucose-Capillary: 168 mg/dL — ABNORMAL HIGH (ref 70–99)
Glucose-Capillary: 163 mg/dL — ABNORMAL HIGH (ref 70–99)

## 2021-08-29 LAB — BRAIN NATRIURETIC PEPTIDE: B Natriuretic Peptide: 21.8 pg/mL (ref 0.0–100.0)

## 2021-08-29 MED ORDER — PANTOPRAZOLE SODIUM 40 MG PO TBEC
40.0000 mg | DELAYED_RELEASE_TABLET | Freq: Two times a day (BID) | ORAL | Status: DC
Start: 2021-08-29 — End: 2021-08-30
  Administered 2021-08-29 – 2021-08-30 (×2): 40 mg via ORAL
  Filled 2021-08-29 (×2): qty 1

## 2021-08-29 MED ORDER — BUDESONIDE 0.5 MG/2ML IN SUSP
0.5000 mg | Freq: Two times a day (BID) | RESPIRATORY_TRACT | Status: DC
  Administered 2021-08-29 – 2021-08-30 (×2): 0.5 mg via RESPIRATORY_TRACT
  Filled 2021-08-29 (×2): qty 2

## 2021-08-29 MED ORDER — ARFORMOTEROL TARTRATE 15 MCG/2ML IN NEBU
15.0000 ug | INHALATION_SOLUTION | Freq: Two times a day (BID) | RESPIRATORY_TRACT | Status: DC
  Administered 2021-08-29 – 2021-08-30 (×2): 15 ug via RESPIRATORY_TRACT
  Filled 2021-08-29 (×2): qty 2

## 2021-08-29 MED ORDER — CALCIUM CARBONATE ANTACID 500 MG PO CHEW
1.0000 | CHEWABLE_TABLET | Freq: Four times a day (QID) | ORAL | Status: DC | PRN
  Administered 2021-08-29: 200 mg via ORAL
  Filled 2021-08-29: qty 1

## 2021-08-29 NOTE — TOC Initial Note (Signed)
Transition of Care (TOC) - Initial/Assessment Note  ? ? ?Patient Details  ?Name: April [name] ?MRN: [id number] ?Date of Birth: 1971-02-11 ? ?Transition of Care (TOC) CM/SW Contact:    ?Denica Web, Marjie Skiff, RN ?Phone Number: ?08/29/2021, 2:25 PM ? ?Clinical Narrative:                 ?Spoke with pt for dc planning. TOC consult had been placed for "Heart failure home screen". Offered to have [place] follow up with pt at home at dc. Pt declines [place] services. Pt has home 02 already at home. TOC will continue to follow. ? ?Expected Discharge Plan: Home/Self Care ?Barriers to Discharge: Continued Medical Work up ? ? ?Patient Goals and CMS Choice ?Patient states their goals for this hospitalization and ongoing recovery are:: To go home ?  ?  ? ?Expected Discharge Plan and Services ?Expected Discharge Plan: Home/Self Care ?  ?Discharge Planning Services: CM Consult ?  ?Living arrangements for the past 2 months: Apartment ?                ?  ?  ?  ?  ?  ?  ?  ?  ?  ?  ? ?Prior Living Arrangements/Services ?Living arrangements for the past 2 months: Apartment ?Lives with:: Relatives ?Patient language and need for interpreter reviewed:: Yes ?       ?Need for Family Participation in Patient Care: Yes (Comment) ?Care giver support system in place?: Yes (comment) ?  ?Criminal Activity/Legal Involvement Pertinent to Current Situation/Hospitalization: No - Comment as needed ? ?Activities of Daily Living ?Home Assistive Devices/Equipment: Cane (specify quad or straight), Walker (specify type) ?ADL Screening (condition at time of admission) ?Patient's cognitive ability adequate to safely complete daily activities?: Yes ?Is the patient deaf or have difficulty hearing?: No ?Does the patient have difficulty seeing, even when wearing glasses/contacts?: No ?Does the patient have difficulty concentrating, remembering, or making decisions?: No ?Patient able to express need for assistance with ADLs?: Yes ?Does the patient have difficulty dressing or  bathing?: No ?Independently performs ADLs?: Yes (appropriate for developmental age) ?Does the patient have difficulty walking or climbing stairs?: No ?Weakness of Legs: None ?Weakness of Arms/Hands: None ? ?Permission Sought/Granted ?  ?  ?   ?   ?   ?   ? ?Emotional Assessment ?Appearance:: Appears older than stated age ?Attitude/Demeanor/Rapport: Gracious ?Affect (typically observed): Calm ?Orientation: : Oriented to Self, Oriented to Place, Oriented to  Time, Oriented to Situation ?Alcohol / Substance Use: Not Applicable ?Psych Involvement: No (comment) ? ?Admission diagnosis:  Moderate persistent asthma with status asthmaticus [J45.42] ?Acute asthma exacerbation [J45.901] ?Patient Active Problem List  ? Diagnosis Date Noted  ? Severe persistent asthma with acute exacerbation in adult 08/23/2021  ? Hypothyroidism 08/23/2021  ? Chronic heart failure with preserved ejection fraction (HFpEF) ([place]) 08/23/2021  ? Primary osteoarthritis of right knee 07/04/2021  ? Body mass index 50.0-59.9, adult ([place]) 07/04/2021  ? Painful orthopaedic hardware ([place]) 07/04/2021  ? COPD with exacerbation ([place]) 02/04/2021  ? Severe persistent asthma 07/04/2020  ? Acute respiratory failure ([place]) 02/20/2019  ? CHF exacerbation ([place]) 02/20/2019  ? Hot flashes   ? COPD exacerbation ([place]) 10/26/2018  ? Acute respiratory distress 08/03/2018  ? Acute exacerbation of chronic obstructive pulmonary disease (COPD) ([place]) 08/03/2018  ? Vitamin D deficiency 05/26/2018  ? Acute asthma exacerbation 05/16/2018  ? Urinary incontinence 11/17/2017  ? Nausea with vomiting 11/06/2017  ? Hypomagnesemia 10/27/2017  ? Dysphagia 10/27/2017  ?  Physical deconditioning 10/25/2017  ? Acute maxillary sinusitis 10/24/2017  ? Emesis, persistent 10/08/2017  ? Volume depletion 10/08/2017  ? Pleuritic chest pain 10/08/2017  ? SOB (shortness of breath) 10/08/2017  ? Debility 09/22/2017  ? HCAP (healthcare-associated pneumonia) 08/06/2017  ? Chronic atrial fibrillation  07/29/2017  ? Acute respiratory failure with hypoxia ([place]) 07/07/2017  ? AF (paroxysmal atrial fibrillation) ([place])   ? Dyspnea 02/04/2017  ? Asthma exacerbation 01/17/2017  ? Metabolic syndrome 63/81/7711  ? Moderate persistent asthma with exacerbation 10/19/2016  ? Normocytic anemia 10/02/2016  ? Elevated hemoglobin A1c 05/05/2016  ? GERD (gastroesophageal reflux disease) 08/30/2015  ? Anxiety and depression 08/30/2015  ? Generalized anxiety disorder 08/30/2015  ? Hyperlipidemia 12/08/2013  ? Seasonal allergic rhinitis 08/29/2013  ? Leukocytosis 10/21/2012  ? Tobacco abuse in remission 10/07/2012  ? COPD mixed type ([place]) 05/07/2012  ? Knee pain, bilateral 04/25/2011  ? Insomnia 03/14/2011  ? Obstructive sleep apnea 12/19/2010  ? Hypokalemia 08/13/2010  ? Cervical back pain with evidence of disc disease 04/08/2008  ? HTN (hypertension) 07/31/2006  ? Morbid obesity due to excess calories ([place]) 06/17/2006  ? Major depressive disorder, recurrent episode ([place]) 04/10/2006  ? ?PCP:  [name], MD ?Pharmacy:   ?[id number] - , [address] ?[place] ?[place] [address] ?Phone: [phone] Fax: [phone] ? ? ? ? ?Social Determinants of Health (SDOH) Interventions ?  ? ?Readmission Risk Interventions ? ?  08/29/2021  ?  2:23 PM 02/07/2021  ?  9:41 AM  ?Readmission Risk Prevention Plan  ?Transportation Screening Complete Complete  ?Medication Review Press photographer) Complete Complete  ?PCP or Specialist appointment within 3-5 days of discharge Complete Complete  ?[place] or Home Care Consult Complete Patient refused  ?SW Recovery Care/Counseling Consult Complete Patient refused  ?Palliative Care Screening Not Applicable Not Applicable  ?[place] Not Applicable Not Applicable  ? ? ? ?

## 2021-08-29 NOTE — Progress Notes (Signed)
?      ? ? ? [facility] ?                                                                           ? ? ?April [name], is a 51 y.o. female, DOB - Dec 04, 1970, YOV:[id number] ?Admit date - 08/26/2021    ?Outpatient Primary MD for the patient is [name], MD ? ?LOS - 2  days ? ?Chief Complaint  ?Patient presents with  ? Shortness of Breath  ?    ? ?Brief summary  ? ?Patient is a 51 year old female with history of severe persistent asthma, paroxysmal atrial fibrillation on Eliquis, chronic diastolic CHF, hypertension, hyperlipidemia, hypothyroidism depression, anxiety, OSA nonadherent to CPAP, rheumatoid arthritis, class III obesity (BMI 52.70).  Recently admitted 4/27 for acute asthma exacerbation and left AMA after a few hours.  Patient presented to ED with worsening shortness of breath and diffuse wheezing.  BNP normal.  Chest x-ray showed mild vascular congestion, received Solu-Medrol 125 mg IV x1, Lasix 40 mg IV x1 ?She is using Trelegy and albuterol as needed.  Also receiving monthly Tezspire injections.  She uses 4 to 5 L supplemental oxygen at night.  She is requesting her home pain and sleep medications. ? ?Assessment & Plan  ? ? ?Principal Problem: ?  Acute on chronic persistent asthma, chronic hypoxic respiratory failure  ?-On 4 to 5 L of O2 at night and intermittently during the day.  Was requiring 10 L of O2 upon presentation, currently back to baseline ?-Has history of for severe persistent asthma on monthly Tezspire injections.  Initially admitted on 4/27 however left AMA next morning and presented back on 4/30 with worsening dyspnea and wheezing. ?-COVID-19, flu negative. ?-Currently audibly wheezing, continue IV Solu-Medrol ?-Continue scheduled nebs every 6 hours, Singulair, increase Pulmicort 2.5 mg twice daily, added Brovana ?-Follows outpatient with pulmonology, Dr. Annamaria Boots ? ? ?Active Problems: ?GERD ?-Continue PPI, increase Protonix to 40 mg twice daily.  At home patient takes omeprazole 40  mg twice daily ? ?Acute on chronic diastolic CHF ?-Echo 7/41/2878 had shown EF of 65 to 70%. ?-Chest x-ray showed mild pulmonary vascular congestion ?-Received Lasix 40 mg IV x1 upon admission, continue home dose of p.o. Lasix ? ?Paroxysmal atrial fibrillation ?-Currently normal sinus rhythm, continue Coreg, eliquis ? ?Hypertension ?-BP currently controlled, continue amlodipine, Coreg, clonidine ? ?Hyperlipidemia ?Continue Lipitor ? ?OSA ?Does not use nightly CPAP, only uses O2 ? ?Morbid obesity ?Estimated body mass index is 53.45 kg/m? as calculated from the following: ?  Height as of this encounter: '5\' 6"'$  (1.676 m). ?  Weight as of this encounter: 150.2 kg. ? ?Anxiety ?-Continue as needed Xanax ? ?Code Status: Full CODE STATUS ?DVT Prophylaxis:   ?apixaban (ELIQUIS) tablet 5 mg  ? ?Level of Care: Level of care: Med-Surg ?Family Communication: Updated patient ? ? ?Disposition Plan:      Remains inpatient appropriate: Hopefully DC home in a.m. if wheezing is improving ? ? ?Procedures:  ?None ? ?Consultants:   ?None ? ?Antimicrobials:  ? ? ? ?Medications ? amLODipine  10 mg Oral Daily  ? apixaban  5 mg Oral BID  ? arformoterol  15 mcg Nebulization BID  ? atorvastatin  20 mg Oral q morning  ? budesonide (PULMICORT) nebulizer solution  0.5 mg Nebulization BID  ? carvedilol  6.25 mg Oral BID WC  ? cloNIDine  0.2 mg Oral BID  ? DULoxetine  60 mg Oral Daily  ? furosemide  40 mg Oral Daily  ? insulin aspart  0-15 Units Subcutaneous TID WC  ? insulin aspart  0-5 Units Subcutaneous QHS  ? ipratropium-albuterol  3 mL Nebulization Q6H  ? levothyroxine  50 mcg Oral Daily  ? methylPREDNISolone sodium succinate  60 mg Intravenous Q12H  ? montelukast  10 mg Oral QHS  ? pantoprazole  40 mg Oral Daily  ? pregabalin  300 mg Oral BID  ? topiramate  200 mg Oral QHS  ? ? ? ? ?Subjective:  ? ?April [name] was seen and examined today.  Audibly wheezing, no coughing, fevers or chills, nausea or vomiting.  Afebrile ?Objective:  ? ?Vitals:  ?  08/29/21 0535 08/29/21 0912 08/29/21 0917 08/29/21 1509  ?BP: (!) 122/50     ?Pulse: 65     ?Resp: '17 17  15  '$ ?Temp: 98.1 ?F (36.7 ?C)     ?TempSrc: Oral     ?SpO2: 100% 100% 100% 100%  ?Weight: (!) 150.2 kg     ?Height:      ? ? ?Intake/Output Summary (Last 24 hours) at 08/29/2021 1601 ?Last data filed at 08/29/2021 1230 ?Gross per 24 hour  ?Intake 720 ml  ?Output --  ?Net 720 ml  ? ? ? ?Wt Readings from Last 3 Encounters:  ?08/29/21 (!) 150.2 kg  ?08/24/21 (!) 148.1 kg  ?08/02/21 (!) 148.1 kg  ? ? ? ?Exam ?General: Alert and oriented x 3, NAD ?Cardiovascular: S1 S2 auscultated,  RRR ?Respiratory: Diffuse expiratory wheezing bilaterally ?Gastrointestinal: Soft, nontender, nondistended, + bowel sounds ?Ext: no pedal edema bilaterally ?Neuro: Strength 5/5 upper and lower extremities bilaterally ?Psych: Normal affect and demeanor, alert and oriented x3  ? ? ? ?Data Reviewed:  I have personally reviewed following labs  ? ? ?CBC ?Lab Results  ?Component Value Date  ? WBC 21.4 (H) 08/28/2021  ? RBC 4.56 08/28/2021  ? HGB 11.4 (L) 08/28/2021  ? HCT 38.0 08/28/2021  ? MCV 83.3 08/28/2021  ? MCH 25.0 (L) 08/28/2021  ? PLT 404 (H) 08/28/2021  ? MCHC 30.0 08/28/2021  ? RDW 18.5 (H) 08/28/2021  ? LYMPHSABS 2.6 08/28/2021  ? MONOABS 1.7 (H) 08/28/2021  ? EOSABS 0.0 08/28/2021  ? BASOSABS 0.0 08/28/2021  ? ? ? ?Last metabolic panel ?Lab Results  ?Component Value Date  ? NA 138 08/28/2021  ? K 4.2 08/28/2021  ? CL 107 08/28/2021  ? CO2 25 08/28/2021  ? BUN 19 08/28/2021  ? CREATININE 0.88 08/28/2021  ? GLUCOSE 136 (H) 08/28/2021  ? GFRNONAA >60 08/28/2021  ? GFRAA >60 02/20/2019  ? CALCIUM 8.8 (L) 08/28/2021  ? PHOS 2.2 (L) 05/24/2021  ? PROT 7.1 08/23/2021  ? ALBUMIN 3.3 (L) 08/23/2021  ? BILITOT 0.3 08/23/2021  ? ALKPHOS 68 08/23/2021  ? AST 16 08/23/2021  ? ALT 11 08/23/2021  ? ANIONGAP 6 08/28/2021  ? ? ?CBG (last 3)  ?Recent Labs  ?  08/28/21 ?2205 08/29/21 ?0748 08/29/21 ?1148  ?GLUCAP 216* 143* 141*  ?  ? ? ? ? ? ?Estill Cotta  M.D. ?[facility] ?08/29/2021, 4:01 PM ? ?Available via Epic secure chat 7am-7pm ?After 7 pm, please refer to night coverage provider listed on amion. ? ? ? ?

## 2021-08-29 NOTE — Progress Notes (Signed)
Pt stated she does not wear cpap at home and does not want to wear one while here in the hospital.  Pt is aware that RT is available all night should she change her mind. ?

## 2021-08-30 ENCOUNTER — Ambulatory Visit: Payer: Medicare Other

## 2021-08-30 DIAGNOSIS — I5032 Chronic diastolic (congestive) heart failure: Secondary | ICD-10-CM

## 2021-08-30 DIAGNOSIS — I5023 Acute on chronic systolic (congestive) heart failure: Secondary | ICD-10-CM

## 2021-08-30 LAB — GLUCOSE, CAPILLARY
Glucose-Capillary: 206 mg/dL — ABNORMAL HIGH (ref 70–99)
Glucose-Capillary: 133 mg/dL — ABNORMAL HIGH (ref 70–99)

## 2021-08-30 MED ORDER — PREDNISONE 20 MG PO TABS
60.0000 mg | ORAL_TABLET | Freq: Once | ORAL | Status: AC
  Administered 2021-08-30: 60 mg via ORAL
  Filled 2021-08-30: qty 3

## 2021-08-30 MED ORDER — PREDNISONE 10 MG PO TABS: ORAL_TABLET | ORAL | 0 refills | Status: DC

## 2021-08-30 MED ORDER — CLONAZEPAM 0.5 MG PO TABS: 0.5000 mg | ORAL_TABLET | Freq: Every day | ORAL | 0 refills | Status: DC | PRN

## 2021-08-30 NOTE — Progress Notes (Signed)
Patient discharged to home, discharge instructions reviewed with patient who verbalized understanding. New RX's sent electronically to pharmacy. ?

## 2021-08-30 NOTE — Discharge Summary (Signed)
?Physician Discharge Summary ?  ?Patient: April [name] MRN: [id number] DOB: 1971-04-17  ?Admit date:     08/26/2021  ?Discharge date: 08/30/21  ?Discharge Physician: Estill Cotta, MD   ? ?PCP: [name], MD  ? ?Recommendations at discharge:  ? ?Patient to go to the infusion center in the pulmonology clinic for his Tezepelumab at 11 AM tomorrow ?Follow-up appointment with pulmonology scheduled on Monday, 09/03/2021 ?Continue prednisone with taper, nebulizers. ? ?Discharge Diagnoses: ? ?  Acute asthma exacerbation ?Acute on chronic respiratory failure with hypoxia ?  History of severe persistent asthma ?  Anxiety ?  HTN (hypertension) ?  AF (paroxysmal atrial fibrillation) ([place]) ?  Anxiety and depression ?  Acute on chronic diastolic CHF exacerbation ([facility]) ?  GERD ?  Morbid obesity ? ?Hospital Course: ? ?Patient is a 51 year old female with history of severe persistent asthma, paroxysmal atrial fibrillation on Eliquis, chronic diastolic CHF, hypertension, hyperlipidemia, hypothyroidism depression, anxiety, OSA nonadherent to CPAP, rheumatoid arthritis, class III obesity (BMI 52.70).  Recently admitted 4/27 for acute asthma exacerbation and left AMA after a few hours.  Patient presented to ED with worsening shortness of breath and diffuse wheezing.  BNP normal.  Chest x-ray showed mild vascular congestion, received Solu-Medrol 125 mg IV x1, Lasix 40 mg IV x1 ?She is using Trelegy and albuterol as needed.  Also receiving monthly Tezspire injections.  She uses 4 to 5 L supplemental oxygen at night.  She is requesting her home pain and sleep medications. ? ?Assessment and Plan: ? ?Acute on chronic persistent asthma, chronic hypoxic respiratory failure  ?-On 4 to 5 L of O2 at night and intermittently during the day.  Was requiring 10 L of O2 upon presentation, currently back to baseline ?-Has history of for severe persistent asthma on monthly Tezspire injections.  Initially admitted on 4/27 however left AMA next morning  and presented back on 4/30 with worsening dyspnea and wheezing. ?-COVID-19, flu negative. ?-Patient was placed on IV Solu-Medrol, scheduled nebs, Pulmicort, Brovana, Singulair ?-Transition to oral prednisone with taper, continue duonebs, Trelegy daily ?-She has scheduled appointment forTezepelumab infusion on 08/31/2021 at 11 AM. ?-Patient has a follow-up appointment on Monday, 09/03/2021 ?  ? ?GERD ?-Continue omeprazole ?  ?Acute on chronic diastolic CHF ?-Echo 2/53/6644 had shown EF of 65 to 70%. ?-Chest x-ray showed mild pulmonary vascular congestion ?-Received Lasix 40 mg IV x1 upon admission, continue home dose of p.o. Lasix ?  ?Paroxysmal atrial fibrillation ?-Currently normal sinus rhythm, continue Coreg, eliquis ?  ?Hypertension ?-BP currently controlled, continue amlodipine, Coreg, clonidine ?  ?Hyperlipidemia ?Continue Lipitor ?  ?OSA ?Does not use nightly CPAP, only uses O2 ?  ?Morbid obesity ?Estimated body mass index is 53.45 kg/m? as calculated from the following: ?  Height as of this encounter: $RemoveBeforeD'5\' 6"'hSUXwBDblAVoqx$  (1.676 m). ?  Weight as of this encounter: 150.2 kg. ?  ?Anxiety ?-Continue as needed Klonopin ?  ?Pain control - [employer] Controlled Substance Reporting System database was reviewed. and patient was instructed, not to drive, operate heavy machinery, perform activities at heights, swimming or participation in water activities or provide baby-sitting services while on Pain, Sleep and Anxiety Medications; until their outpatient Physician has advised to do so again. Also recommended to not to take more than prescribed Pain, Sleep and Anxiety Medications.  ? ? ?Consultants: None ?Procedures performed none ?Disposition: Home ?Diet recommendation: Carb modified diet ?Discharge Diet Orders (From admission, onward)  ? ?  Start     Ordered  ?  08/30/21 0000  Diet Carb Modified       ? 08/30/21 1010  ? ?  ?  ? ?  ? ? ?DISCHARGE MEDICATION: ?Allergies as of 08/30/2021   ? ?   Reactions  ? Contrast Media [iodinated  Contrast Media] Itching  ? Ct contrast  ? ?  ? ?  ?Medication List  ?  ? ?TAKE these medications   ? ?albuterol 108 (90 Base) MCG/ACT inhaler ?Commonly known as: ProAir HFA ?Inhale 2 puffs into the lungs every 6 (six) hours as needed for wheezing or shortness of breath. ?  ?amLODipine 10 MG tablet ?Commonly known as: NORVASC ?TAKE 1 TABLET(10 MG) BY MOUTH DAILY ?What changed: See the new instructions. ?  ?apixaban 5 MG Tabs tablet ?Commonly known as: ELIQUIS ?Take 1 tablet (5 mg total) by mouth 2 (two) times daily. ?  ?atorvastatin 20 MG tablet ?Commonly known as: LIPITOR ?Take 20 mg by mouth every morning. ?  ?baclofen 20 MG tablet ?Commonly known as: LIORESAL ?Take 20 mg by mouth daily as needed for muscle spasms (pain). ?  ?carvedilol 6.25 MG tablet ?Commonly known as: COREG ?Take 1 tablet (6.25 mg total) by mouth 2 (two) times daily with a meal. ?  ?clonazePAM 0.5 MG tablet ?Commonly known as: KlonoPIN ?Take 1 tablet (0.5 mg total) by mouth daily as needed for anxiety. ?  ?cloNIDine 0.2 MG tablet ?Commonly known as: CATAPRES ?Take 0.2 mg by mouth 2 (two) times daily. ?  ?Diclofenac Sodium 3 % Gel ?Apply 1 application. topically daily as needed (pain). ?  ?DULoxetine 60 MG capsule ?Commonly known as: CYMBALTA ?Take 1 capsule (60 mg total) by mouth daily. ?  ?EPINEPHrine 0.3 mg/0.3 mL Soaj injection ?Commonly known as: EPI-PEN ?Inject 0.3 mg into the muscle as needed for anaphylaxis. ?  ?fluticasone 50 MCG/ACT nasal spray ?Commonly known as: FLONASE ?SHAKE LIQUID AND USE 1 SPRAY IN EACH NOSTRIL DAILY AS NEEDED FOR ALLERGIES OR RHINITIS ?What changed: See the new instructions. ?  ?furosemide 40 MG tablet ?Commonly known as: LASIX ?Take 1 tablet (40 mg total) by mouth daily. ?  ?ipratropium-albuterol 0.5-2.5 (3) MG/3ML Soln ?Commonly known as: DUONEB ?Take 3 mLs by nebulization every 6 (six) hours as needed (shortness of breath/wheezing). ?What changed: Another medication with the same name was changed. Make sure  you understand how and when to take each. ?  ?Ipratropium-Albuterol 20-100 MCG/ACT Aers respimat ?Commonly known as: COMBIVENT ?Inhale 1 puff, every 6 hours as needed, rescue ?What changed:  ?how much to take ?when to take this ?reasons to take this ?additional instructions ?  ?levothyroxine 50 MCG tablet ?Commonly known as: SYNTHROID ?Take 50 mcg by mouth daily. ?  ?montelukast 10 MG tablet ?Commonly known as: SINGULAIR ?TAKE 1 TABLET BY MOUTH AT BEDTIME. OFFICE VISIT NEEDED FOR ADDITIONAL REFILLS ?What changed: See the new instructions. ?  ?naloxone 4 MG/0.1ML Liqd nasal spray kit ?Commonly known as: NARCAN ?Place 1 spray into the nose once as needed (opioid overdose). ?  ?omeprazole 40 MG capsule ?Commonly known as: PRILOSEC ?Take 40 mg by mouth daily. ?  ?oxyCODONE-acetaminophen 10-325 MG tablet ?Commonly known as: PERCOCET ?Take 1 tablet by mouth 5 (five) times daily as needed (nerve pain). ?  ?Ozempic (1 MG/DOSE) 4 MG/3ML Sopn ?Generic drug: Semaglutide (1 MG/DOSE) ?Inject 1 mg into the skin every Monday. ?  ?phentermine 15 MG capsule ?Take 15 mg by mouth daily. ?  ?potassium chloride SA 20 MEQ tablet ?Commonly known as: KLOR-CON M ?Take 1 tablet (  20 mEq total) by mouth daily. ?  ?predniSONE 10 MG tablet ?Commonly known as: DELTASONE ?Prednisone dosing: Take  Prednisone $RemoveBef'40mg'nQZcGUBBRp$  (4 tabs) x 3 days, then taper to $Remove'30mg'ScHKWLz$  (3 tabs) x 3 days, then $RemoveBe'20mg'xfEfadrPj$  (2 tabs) x 3days, then $RemoveBef'10mg'JVUKmzdacu$  (1 tab) x 3days, then OFF. ?Start taking on: Aug 31, 2021 ?What changed: additional instructions ?  ?pregabalin 300 MG capsule ?Commonly known as: LYRICA ?Take 300 mg by mouth 2 (two) times daily. ?  ?Tezspire 210 MG/1.91ML syringe ?Generic drug: tezepelumab-ekko ?Inject 210 mg into the skin every 28 (twenty-eight) days. ?Notes to patient: AS SCHEDULED ?  ?topiramate 200 MG tablet ?Commonly known as: TOPAMAX ?Take 200 mg by mouth at bedtime. ?  ?Trelegy Ellipta 100-62.5-25 MCG/ACT Aepb ?Generic drug: Fluticasone-Umeclidin-Vilant ?Inhale 1 puff  into the lungs daily. ?  ?zolpidem 10 MG tablet ?Commonly known as: AMBIEN ?TAKE 1 TABLET BY MOUTH AT BEDTIME AS NEEDED FOR SLEEP ?What changed:  ?how much to take ?how to take this ?when to take this ?additi

## 2021-08-30 NOTE — Care Management Important Message (Signed)
Important Message ? ?Patient Details IM Letter given to the Patient. ?Name: April [name] ?MRN: [id number] ?Date of Birth: May 15, 1970 ? ? ?Medicare Important Message Given:  Yes ? ? ? ? ?Kerin Salen ?08/30/2021, 10:29 AM ?

## 2021-08-31 ENCOUNTER — Ambulatory Visit: Payer: Medicare Other

## 2021-08-31 ENCOUNTER — Telehealth: Payer: Self-pay

## 2021-08-31 NOTE — Telephone Encounter (Signed)
Transition Care Management Unsuccessful Follow-up Telephone Call ? ?Date of discharge and from where:  [facility] on 08/30/2021 ? ?Attempts:  1st Attempt ? ?Reason for unsuccessful TCM follow-up call:  Left voice message/ Pt need to schedule HFU appt . Call back requested . ? ?  ?

## 2021-09-02 ENCOUNTER — Encounter: Payer: Self-pay | Admitting: Internal Medicine

## 2021-09-02 NOTE — Assessment & Plan Note (Signed)
Recent exacerbation, possibly viral and now improving ?Plan Steroid talk. Try Combivent as a rescu einhaler ?

## 2021-09-02 NOTE — Assessment & Plan Note (Signed)
Understands potential for cardiac and pulmonary symptoms to overlap. Continues cardiology f/u ?

## 2021-09-03 ENCOUNTER — Telehealth: Payer: Self-pay

## 2021-09-03 ENCOUNTER — Inpatient Hospital Stay: Payer: Medicare Other | Admitting: Nurse Practitioner

## 2021-09-03 NOTE — Progress Notes (Deleted)
$'@Patient'h$  ID: April [name], female    DOB: [birth year], 51 y.o.   MRN: [id number]  No chief complaint on file.   Referring provider: [name], MD  HPI: 51 year old female,   TEST/EVENTS:   Allergies  Allergen Reactions   Contrast Media [Iodinated Contrast Media] Itching    Ct contrast    Immunization History  Administered Date(s) Administered   Influenza Split 01/08/2012   Influenza Whole 03/03/2009, 12/27/2009, 12/19/2010   Influenza, High Dose Seasonal PF 01/12/2019   Influenza,inj,Quad PF,6+ Mos 12/29/2012, 12/31/2013, 03/07/2015, 01/23/2016, 01/06/2017, 01/11/2020, 02/07/2021   PFIZER(Purple Top)SARS-COV-2 Vaccination 03/29/2020, 04/29/2020   PNEUMOCOCCAL CONJUGATE-20 02/20/2021   PPD Test 09/25/2011, 07/17/2012, 06/25/2013, 05/21/2016, 11/11/2017   Pneumococcal Polysaccharide-23 03/17/2009, 07/06/2016, 10/20/2016   Tdap 12/19/2010    Past Medical History:  Diagnosis Date   Acanthosis nigricans    Anxiety    Arthritis    "knees" (04/28/2017)   Asthma    Followed by Dr. Annamaria Boots (pulmonology); receives every other week omalizumab injections; has frequent exacerbations   Back pain    Chronic diastolic CHF (congestive heart failure) ([place]) 01/17/2017   COPD (chronic obstructive pulmonary disease) ([place])    PFTs in 2002, FEV1/FVC 65, no post bronchodilater test done   Depression    GERD (gastroesophageal reflux disease)    Headache(784.0)    "q couple days" (42/68/3419)   Helicobacter pylori (H. pylori) infection    Hypertension, essential    Insomnia    Joint pain    Lower extremity edema    Menorrhagia    Morbid obesity ([place])    OSA on CPAP    Sleep study 2008 - mild OSA, not enough events to titrate CPAP; wears CPAP now/pt on 04/28/2017   Pneumonia X 1   Prediabetes    Rheumatoid arthritis (HCC)    Seasonal allergies    Shortness of breath    Tobacco user    Vitamin D deficiency     Tobacco History: Social History   Tobacco Use  Smoking Status  Former   Packs/day: 0.50   Years: 26.00   Pack years: 13.00   Types: Cigarettes   Quit date: 09/12/2014   Years since quitting: 6.9  Smokeless Tobacco Never   Counseling given: Not Answered   Outpatient Medications Prior to Visit  Medication Sig Dispense Refill   amLODipine (NORVASC) 10 MG tablet TAKE 1 TABLET(10 MG) BY MOUTH DAILY 90 tablet 1   albuterol (PROAIR HFA) 108 (90 Base) MCG/ACT inhaler Inhale 2 puffs into the lungs every 6 (six) hours as needed for wheezing or shortness of breath. 8.5 g 11   apixaban (ELIQUIS) 5 MG TABS tablet Take 1 tablet (5 mg total) by mouth 2 (two) times daily. 180 tablet 1   atorvastatin (LIPITOR) 20 MG tablet Take 20 mg by mouth every morning.     baclofen (LIORESAL) 20 MG tablet Take 20 mg by mouth daily as needed for muscle spasms (pain).     carvedilol (COREG) 6.25 MG tablet Take 1 tablet (6.25 mg total) by mouth 2 (two) times daily with a meal. 180 tablet 1   clonazePAM (KLONOPIN) 0.5 MG tablet Take 1 tablet (0.5 mg total) by mouth daily as needed for anxiety. 20 tablet 0   cloNIDine (CATAPRES) 0.2 MG tablet Take 0.2 mg by mouth 2 (two) times daily.     Diclofenac Sodium 3 % GEL Apply 1 application. topically daily as needed (pain).     DULoxetine (CYMBALTA) 60 MG capsule Take  1 capsule (60 mg total) by mouth daily. 90 capsule 1   EPINEPHrine 0.3 mg/0.3 mL IJ SOAJ injection Inject 0.3 mg into the muscle as needed for anaphylaxis. 1 each 5   fluticasone (FLONASE) 50 MCG/ACT nasal spray SHAKE LIQUID AND USE 1 SPRAY IN EACH NOSTRIL DAILY AS NEEDED FOR ALLERGIES OR RHINITIS (Patient taking differently: 1 spray daily.) 16 g 0   Fluticasone-Umeclidin-Vilant (TRELEGY ELLIPTA) 100-62.5-25 MCG/INH AEPB Inhale 1 puff into the lungs daily. 60 each 5   furosemide (LASIX) 40 MG tablet Take 1 tablet (40 mg total) by mouth daily. 30 tablet 6   Ipratropium-Albuterol (COMBIVENT) 20-100 MCG/ACT AERS respimat Inhale 1 puff, every 6 hours as needed, rescue (Patient  taking differently: 1 puff every 6 (six) hours as needed for wheezing or shortness of breath.) 4 g 12   ipratropium-albuterol (DUONEB) 0.5-2.5 (3) MG/3ML SOLN Take 3 mLs by nebulization every 6 (six) hours as needed (shortness of breath/wheezing). 360 mL 12   levothyroxine (SYNTHROID) 50 MCG tablet Take 50 mcg by mouth daily.     montelukast (SINGULAIR) 10 MG tablet TAKE 1 TABLET BY MOUTH AT BEDTIME. OFFICE VISIT NEEDED FOR ADDITIONAL REFILLS (Patient taking differently: Take 10 mg by mouth at bedtime.) 90 tablet 0   naloxone (NARCAN) nasal spray 4 mg/0.1 mL Place 1 spray into the nose once as needed (opioid overdose).     omeprazole (PRILOSEC) 40 MG capsule Take 40 mg by mouth daily.     oxyCODONE-acetaminophen (PERCOCET) 10-325 MG tablet Take 1 tablet by mouth 5 (five) times daily as needed (nerve pain).     phentermine 15 MG capsule Take 15 mg by mouth daily.     potassium chloride SA (KLOR-CON M) 20 MEQ tablet Take 1 tablet (20 mEq total) by mouth daily. 30 tablet 3   predniSONE (DELTASONE) 10 MG tablet Prednisone dosing: Take  Prednisone '40mg'$  (4 tabs) x 3 days, then taper to '30mg'$  (3 tabs) x 3 days, then '20mg'$  (2 tabs) x 3days, then '10mg'$  (1 tab) x 3days, then OFF. 30 tablet 0   pregabalin (LYRICA) 300 MG capsule Take 300 mg by mouth 2 (two) times daily.     Semaglutide, 1 MG/DOSE, (OZEMPIC, 1 MG/DOSE,) 4 MG/3ML SOPN Inject 1 mg into the skin every Monday.     tezepelumab-ekko (TEZSPIRE) 210 MG/1.91ML syringe Inject 210 mg into the skin every 28 (twenty-eight) days.     topiramate (TOPAMAX) 200 MG tablet Take 200 mg by mouth at bedtime.     zolpidem (AMBIEN) 10 MG tablet TAKE 1 TABLET BY MOUTH AT BEDTIME AS NEEDED FOR SLEEP (Patient taking differently: Take 10 mg by mouth at bedtime.) 30 tablet 5   No facility-administered medications prior to visit.     Review of Systems:   Constitutional: No weight loss or gain, night sweats, fevers, chills, fatigue, or lassitude. HEENT: No headaches,  difficulty swallowing, tooth/dental problems, or sore throat. No sneezing, itching, ear ache, nasal congestion, or post nasal drip CV:  No chest pain, orthopnea, PND, swelling in lower extremities, anasarca, dizziness, palpitations, syncope Resp: No shortness of breath with exertion or at rest. No excess mucus or change in color of mucus. No productive or non-productive. No hemoptysis. No wheezing.  No chest wall deformity GI:  No heartburn, indigestion, abdominal pain, nausea, vomiting, diarrhea, change in bowel habits, loss of appetite, bloody stools.  GU: No dysuria, change in color of urine, urgency or frequency.  No flank pain, no hematuria  Skin: No rash, lesions, ulcerations  MSK:  No joint pain or swelling.  No decreased range of motion.  No back pain. Neuro: No dizziness or lightheadedness.  Psych: No depression or anxiety. Mood stable.     Physical Exam:  LMP 08/11/2021 (Exact Date)   GEN: Pleasant, interactive, well-nourished/chronically-ill appearing/acutely-ill appearing/poorly-nourished/morbidly obese; in no acute distress.****** HEENT:  Normocephalic and atraumatic. EACs patent bilaterally. TM pearly gray with present light reflex bilaterally. PERRLA. Sclera white. Nasal turbinates pink, moist and patent bilaterally. No rhinorrhea present. Oropharynx pink and moist, without exudate or edema. No lesions, ulcerations, or postnasal drip.  NECK:  Supple w/ fair ROM. No JVD present. Normal carotid impulses w/o bruits. Thyroid symmetrical with no goiter or nodules palpated. No lymphadenopathy.   CV: RRR, no m/r/g, no peripheral edema. Pulses intact, +2 bilaterally. No cyanosis, pallor or clubbing. PULMONARY:  Unlabored, regular breathing. Clear bilaterally A&P w/o wheezes/rales/rhonchi. No accessory muscle use. No dullness to percussion. GI: BS present and normoactive. Soft, non-tender to palpation. No organomegaly or masses detected. No CVA tenderness. MSK: No erythema, warmth or  tenderness. Cap refil <2 sec all extrem. No deformities or joint swelling noted.  Neuro: A/Ox3. No focal deficits noted.   Skin: Warm, no lesions or rashe Psych: Normal affect and behavior. Judgement and thought content appropriate.     Lab Results:  CBC    Component Value Date/Time   WBC 21.4 (H) 08/28/2021 0600   RBC 4.56 08/28/2021 0600   HGB 11.4 (L) 08/28/2021 0600   HGB 10.4 (L) 01/22/2018 1403   HCT 38.0 08/28/2021 0600   HCT 33.3 (L) 01/22/2018 1403   PLT 404 (H) 08/28/2021 0600   PLT 616 (H) 01/22/2018 1403   MCV 83.3 08/28/2021 0600   MCV 82 01/22/2018 1403   MCH 25.0 (L) 08/28/2021 0600   MCHC 30.0 08/28/2021 0600   RDW 18.5 (H) 08/28/2021 0600   RDW 17.1 (H) 01/22/2018 1403   LYMPHSABS 2.6 08/28/2021 0600   LYMPHSABS 3.3 (H) 01/22/2018 1403   MONOABS 1.7 (H) 08/28/2021 0600   EOSABS 0.0 08/28/2021 0600   EOSABS 0.3 01/22/2018 1403   BASOSABS 0.0 08/28/2021 0600   BASOSABS 0.1 01/22/2018 1403    BMET    Component Value Date/Time   NA 138 08/28/2021 0600   NA 142 05/25/2018 1309   K 4.2 08/28/2021 0600   CL 107 08/28/2021 0600   CO2 25 08/28/2021 0600   GLUCOSE 136 (H) 08/28/2021 0600   BUN 19 08/28/2021 0600   BUN 10 05/25/2018 1309   CREATININE 0.88 08/28/2021 0600   CREATININE 0.56 05/21/2016 1708   CALCIUM 8.8 (L) 08/28/2021 0600   GFRNONAA >60 08/28/2021 0600   GFRNONAA >89 05/21/2016 1708   GFRAA >60 02/20/2019 0312   GFRAA >89 05/21/2016 1708    BNP    Component Value Date/Time   BNP 21.8 08/29/2021 1514     Imaging:  DG Chest 2 View  Result Date: 08/26/2021 CLINICAL DATA:  Acute shortness of breath EXAM: CHEST - 2 VIEW COMPARISON:  08/23/2021 and prior radiographs FINDINGS: The cardiomediastinal silhouette is unremarkable. Mild peribronchial thickening is unchanged. Mild pulmonary vascular congestion noted. There is no evidence of focal airspace disease, pulmonary edema, suspicious pulmonary nodule/mass, pleural effusion, or  pneumothorax. No acute bony abnormalities are identified. IMPRESSION: Mild pulmonary vascular congestion. Electronically Signed   By: Margarette Canada M.D.   On: 08/26/2021 16:23   DG Chest 2 View  Result Date: 08/23/2021 CLINICAL DATA:  Shortness of breath EXAM: CHEST - 2 VIEW  COMPARISON:  05/22/2021 FINDINGS: Cardiac size is within normal limits. Central pulmonary vessels are prominent without signs of alveolar pulmonary edema. There is no focal pulmonary consolidation. There is no pleural effusion or pneumothorax. IMPRESSION: Central pulmonary vessels are prominent without signs of pulmonary edema. There is no focal pulmonary consolidation. There is no pleural effusion or pneumothorax. Electronically Signed   By: Elmer Picker M.D.   On: 08/23/2021 16:32    tezepelumab-ekko (TEZSPIRE) 210 MG/1.91ML syringe 210 mg     Date Action Dose Route User   Discharged on 08/30/2021   Admitted on 08/26/2021   Discharged on 08/24/2021   Admitted on 08/23/2021   07/05/2021 1512 Given 210 mg Subcutaneous (Right Arm) Redmond Pulling, Melissa A, RN      tezepelumab-ekko (TEZSPIRE) 210 MG/1.91ML syringe 210 mg     Date Action Dose Route User   Discharged on 08/30/2021   Admitted on 08/26/2021   Discharged on 08/24/2021   Admitted on 08/23/2021   08/02/2021 1524 Given 210 mg Subcutaneous (Left Arm) Ranabhat, Sabitri, RN          Latest Ref Rng & Units 08/05/2017    1:13 PM  PFT Results  FVC-Pre L 1.08    FVC-Predicted Pre % 33    Pre FEV1/FVC % % 62    FEV1-Pre L 0.67    FEV1-Predicted Pre % 25      Lab Results  Component Value Date   NITRICOXIDE 12 03/20/2015        Assessment & Plan:   No problem-specific Assessment & Plan notes found for this encounter.   I spent *** minutes of dedicated to the care of this patient on the date of this encounter to include pre-visit review of records, face-to-face time with the patient discussing conditions above, post visit ordering of testing, clinical  documentation with the electronic health record, making appropriate referrals as documented, and communicating necessary findings to members of the patients care team.  Clayton Bibles, NP 09/03/2021  Pt aware and understands NP's role.

## 2021-09-03 NOTE — Telephone Encounter (Signed)
Transition Care Management Unsuccessful Follow-up Telephone Call ? ?Date of discharge and from where:  08/30/2021, [facility]  ? ?Attempts:  2nd Attempt ? ?Reason for unsuccessful TCM follow-up call:  Left voice message [phone], call back requested.  ?Need to schedule a hospital follow up appointment ? ? ? ?

## 2021-09-03 NOTE — Telephone Encounter (Signed)
Pt called to reschedule appt w/[facility] due to no show ?

## 2021-09-04 ENCOUNTER — Telehealth: Payer: Self-pay

## 2021-09-04 NOTE — Telephone Encounter (Signed)
She is currently on maximum dose of Ambien prescribed by her pulmonologist for insomnia and takes so many high risk medications. I will recommend she reach out to him if she is still struggling with insomnia as I do not have any additional options. ?

## 2021-09-04 NOTE — Telephone Encounter (Signed)
From the discharge call: ? ? She said she is feeling okay except for her anxiety. She stated that her anxiety is " through the roof."  She explained that she is not able to sleep and has disturbing dreams.She denied SI/HI.  She stated that the medications she has for sleep are not helping and said that she was given xanax in the hospital and that helped, she felt very calm.  We discussed the option of [facility]  and their Urgent Care services if needed but she said that is not necessary. It is primarily that she is not able to sleep. I told her that this information would be shared with Dr Margarita Rana.  ? ?she said she has all of her medications and did not have any questions about the med regime.  She has a nebulizer but said that Dr Annamaria Boots is supposed to be getting her a new one.  ? ?Has home O2, using it at 5L when needed, primarily at night. ? ? Scheduled to see Dr Margarita Rana- 09/06/2021.  Needs to reschedule her appointment with pulmonary.    ?

## 2021-09-04 NOTE — Telephone Encounter (Signed)
Transition Care Management Follow-up Telephone Call ?Date of discharge and from where: 08/30/2021, [facility]  ?How have you been since you were released from the hospital? She said she is feeling okay except for her anxiety. She stated that her anxiety is " through the roof."  She explained that she is not able to sleep and has disturbing dream.She denied SI/HI.  She stated that the medications she has for sleep are not helping and said that she was given xanax in the hospital and that helped, she felt very calm.  We discussed the option of [facility]  and their Urgent Care services if needed but she said that is not necessary. It is primarily that she is not able to sleep. I told her that this information would be shared with Dr Margarita Rana.  ?Any questions or concerns? Yes- noted above ? ?Items Reviewed: ?Did the pt receive and understand the discharge instructions provided? Yes  ?Medications obtained and verified? Yes  - she said she has all of her medications and did not have any questions about the med regime.  She has a nebulizer but said that Dr Annamaria Boots is supposed to be getting her a new one.  ?Other? No  ?Any new allergies since your discharge? No  ?Dietary orders reviewed? No ?Do you have support at home?  Lives alone  ? ?Home Care and Equipment/Supplies: ?Were home health services ordered? no ?If so, what is the name of the agency? N/a  ?Has the agency set up a time to come to the patient's home? not applicable ?Were any new equipment or medical supplies ordered?  No ?What is the name of the medical supply agency? N/a ?Were you able to get the supplies/equipment? not applicable ?Do you have any questions related to the use of the equipment or supplies? No ? ?She has home O2 that she uses @ 5L occasionally, mostly at night.  ?Functional Questionnaire: (I = Independent and D = Dependent) ?ADLs: independent ? ? ?Follow up appointments reviewed: ? ?PCP Hospital f/u appt confirmed? Yes  Scheduled to see Dr Margarita Rana-  09/06/2021.  ?[facility] f/u appt confirmed?  She needs to reschedule her appointment with pulmonary.    ?Are transportation arrangements needed? No  ?If their condition worsens, is the pt aware to call PCP or go to the Emergency Dept.? Yes ?Was the patient provided with contact information for the PCP's office or ED? Yes ?Was to pt encouraged to call back with questions or concerns? Yes ? ?

## 2021-09-05 NOTE — Telephone Encounter (Signed)
Call placed to patient and informed her that Dr Margarita Rana recommends that she reach out to her pulmonologist to discuss her difficulty sleeping because she is already on maximum dose of Ambien and she  takes many high risk medications. Dr Margarita Rana does not have any additional options to offer. Alonzo said she understood.  I also reminded her of her appointment with Dr Margarita Rana tomorrow - 09/06/2021.  ?

## 2021-09-06 ENCOUNTER — Ambulatory Visit: Payer: Medicare Other | Admitting: Family Medicine

## 2021-09-07 ENCOUNTER — Ambulatory Visit (INDEPENDENT_AMBULATORY_CARE_PROVIDER_SITE_OTHER): Payer: Medicare Other

## 2021-09-07 VITALS — BP 161/85 | HR 106 | Temp 97.9°F | Resp 22 | Ht 66.0 in | Wt 326.0 lb

## 2021-09-07 DIAGNOSIS — J4552 Severe persistent asthma with status asthmaticus: Secondary | ICD-10-CM | POA: Diagnosis not present

## 2021-09-07 MED ORDER — TEZEPELUMAB-EKKO 210 MG/1.91ML ~~LOC~~ SOSY
210.0000 mg | PREFILLED_SYRINGE | Freq: Once | SUBCUTANEOUS | Status: AC
  Administered 2021-09-07: 210 mg via SUBCUTANEOUS
  Filled 2021-09-07: qty 1.91

## 2021-09-07 NOTE — Progress Notes (Signed)
Diagnosis: Asthma ? ?Provider:  Marshell Garfinkel, MD ? ?Procedure: Injection ? ?Tezepelumab (Tezspire), Dose: '210mg'$  , Site: subcutaneous, Number of injections: 1 ? ?Discharge: Condition: Good, Destination: Home . AVS provided to patient.  ? ?Performed by:  Koren Shiver, RN  ? ? ? ?  ?

## 2021-09-13 ENCOUNTER — Telehealth: Payer: Self-pay | Admitting: Internal Medicine

## 2021-09-13 MED ORDER — PREDNISONE 10 MG PO TABS: ORAL_TABLET | ORAL | 0 refills | Status: DC

## 2021-09-13 MED ORDER — CLONAZEPAM 0.5 MG PO TABS: 0.5000 mg | ORAL_TABLET | Freq: Every day | ORAL | 0 refills | Status: DC | PRN

## 2021-09-13 NOTE — Telephone Encounter (Signed)
Prednisone and clonazepam refilled

## 2021-09-13 NOTE — Telephone Encounter (Signed)
Called and spoke with patient to let her know that refills have been sent in. She expressed understanding. Nothing further needed at this time.

## 2021-09-13 NOTE — Telephone Encounter (Signed)
Called and spoke with patient who states that she is having symptom of wheezing and dry cough and has not been sleeping over the last couple days. Would like refill for Prednisone and Clonazepam. Pharmacy is [facility].  Please advise

## 2021-09-26 ENCOUNTER — Telehealth: Payer: Self-pay | Admitting: Pharmacist

## 2021-09-26 DIAGNOSIS — J449 Chronic obstructive pulmonary disease, unspecified: Secondary | ICD-10-CM

## 2021-09-26 DIAGNOSIS — J4541 Moderate persistent asthma with (acute) exacerbation: Secondary | ICD-10-CM

## 2021-09-26 NOTE — Telephone Encounter (Signed)
Ok to let her self-administer Tezspire, but please help me keep track. She has not been very reliable about self-care in the past.

## 2021-09-26 NOTE — Telephone Encounter (Signed)
Infusion center is transitioning all pulm patients who receive asthma biologics to self-admin. Patient receives Tezspire '210mg'$  SQ every 4 weeks at infusion center.  Routing to Dr. Annamaria Boots for confirmation that patient can be transitioned to self-admin. Tezspire patient assistance paperwork will be completed at next dose on 10/05/21 Maudie Mercury C will coordinate).  Pharmacy team will work on authorization through pharmacy benefit

## 2021-09-27 ENCOUNTER — Ambulatory Visit: Payer: Medicare Other

## 2021-09-27 ENCOUNTER — Other Ambulatory Visit: Payer: Self-pay | Admitting: Family Medicine

## 2021-09-27 NOTE — Telephone Encounter (Signed)
Submitted a Prior Authorization request to [facility] for TEZSPIRE Autoinjector via CoverMyMeds. Will update once we receive a response.   KeyLadona Horns - PA Case ID: [id number]

## 2021-09-28 ENCOUNTER — Other Ambulatory Visit (HOSPITAL_COMMUNITY): Payer: Self-pay

## 2021-09-28 ENCOUNTER — Telehealth: Payer: Self-pay | Admitting: Internal Medicine

## 2021-09-28 ENCOUNTER — Encounter: Payer: Self-pay | Admitting: Internal Medicine

## 2021-09-28 MED ORDER — PREDNISONE 10 MG PO TABS: ORAL_TABLET | ORAL | 0 refills | Status: DC

## 2021-09-28 NOTE — Telephone Encounter (Signed)
I called and spoke with the pt and notified of response per Tammy. She is scheduled to see CY next wk 10/05/21. Confirmed again that she is using her maintenance inhaler.

## 2021-09-28 NOTE — Telephone Encounter (Signed)
She has an appt 10/05/21. I will let her know about self-admin and f/u with you after I speak with her.

## 2021-09-28 NOTE — Telephone Encounter (Signed)
Received notification from [facility] regarding a prior authorization for TEZSPIRE autoinjector. Authorization has been APPROVED from 09/27/21 to 04/28/22.   Per test claim, copay for 28 days supply is $0  Patient can fill through [place]: [phone]   Authorization # [id number] Phone # [phone]  [name], PharmD, MPH, BCPS, CPP Clinical Pharmacist (Rheumatology and Pulmonology)

## 2021-09-28 NOTE — Telephone Encounter (Signed)
Can begin Prednisone taper .  If not improving will need to go to ER .  Make sure she is taking her maintenance inhaler Needs office visit next week for follow-up with Dr. Annamaria Boots    Please contact office for sooner follow up if symptoms do not improve or worsen or seek emergency care

## 2021-09-28 NOTE — Telephone Encounter (Signed)
Spoke with the pt  She is c/o increased SOB and wheezing past 3-4 days  She is coughing some but no sputum production  No fever, aches  She had some pred on hand and has been taking 20 mg past 3 days  She is asking for something to be called in  Has been using her trelegy, albuterol, singulair, duoneb Please advise, thanks!  Allergies  Allergen Reactions   Contrast Media [Iodinated Contrast Media] Itching    Ct contrast

## 2021-10-02 ENCOUNTER — Other Ambulatory Visit: Payer: Self-pay | Admitting: Family Medicine

## 2021-10-02 DIAGNOSIS — R232 Flushing: Secondary | ICD-10-CM

## 2021-10-03 NOTE — Progress Notes (Deleted)
Patient ID: April [name], female    DOB: 1970/12/09, 51 y.o.   MRN: [id number]   HPI F former smoker followed for chronic severe asthma/ Xolair complicated by non-compliance,  OSA/ CPAP,  morbid obesity, HBP, depression, GERD, AFib/ Eliquis  NPSG 03/31/07- AHI 5.8/ hr, weight 330 lbs. She canceled planned more recent study. Office spirometry 10/24/14- severe obstructive airways disease. FVC 2.10/66%, FEV1 1.25/47%, FEV1/FVC 0.59, FEF 25-75 percent 0.64/19% Unattended HST 07/06/16- AHI 11/hour, desaturation to 74%. Office Spirometry 07/29/2016-moderately severe obstructive airways disease. FVC 2.21/68%, FEV1 1.53/57%, ratio 0.69, FEF 25-75% 0.98/34% [facility]ong[facility]- tracheostomy. Dupixent enrolled 10/22/2018 HST 12/26/2018- AHI 9.7/ hr, desaturation to 9.7/ hr, desaturation to 69% -------------------------------------------------------   07/31/21- 51 year old female Smoker followed for chronic severe asthma/Xolair, Tracheostomy at [facility] 2019, Insomnia,  complicated by noncompliance, OSA/ oral appliance, morbid obesity, HTN, depression, GERD Covid infection, PAFib, dCHF, Dyslipidemia,  Dupixent enrolled 10/22/2018-- every 2 weeks -Proair hfa, Trelegy  100, Singulair, Neb Duoneb, Dupixent, Ambien 10, Vyvanse, [place], [place] 1/24-1/28- Asthma exacerb ? Viral>  Says she thinks rrelegy and Dupixent help. Took some prednisone she had at home for a rough patch this week. Wants to try Combivent as a rescue inhaler. Trying to avoid smoke exposure, although some of her family smoke around her.  CTachest 05/23/21- IMPRESSION: 1. No pulmonary emboli or acute abnormality. 2. Mild bilateral centrilobular and paraseptal bullous emphysema. 3. Mild atheromatous calcifications involving the left anterior descending coronary artery. 4. Progressive changes of discogenic sclerosis involving the C6, C7 and T1 vertebrae.  10/05/21- 51 year old female Smoker followed for chronic severe asthma/Xolair,  Tracheostomy at [facility] 2019, Insomnia,  complicated by noncompliance, OSA/ oral appliance, morbid obesity, HTN, depression, GERD Covid infection, PAFib, dCHF, Dyslipidemia,  Dupixent changed to [employer]. -Proair hfa, Trelegy  100, Singulair, Neb Duoneb, Dupixent, Ambien 10, Vyvanse, Tezspire, Hosp 4/30-5/4 for Asthma exacerb, AonC Resp Failure w Hypoxia, I have okayed self inj Tezspire to discuss at this ov.  Review of Systems-See HPI  + = positive Constitutional:   + weight loss, night sweats, fevers, chills, +fatigue, lassitude. HEENT:   No-  headaches, difficulty swallowing, tooth/dental problems, sore throat,    sneezing, itching, ear ache, +nasal congestion, post nasal drip,  CV:  No-   chest pain, orthopnea, PND, swelling in lower extremities, anasarca, dizziness, palpitations Resp: +shortness of breath with exertion or at rest.              productive cough,  + non-productive cough,  No- coughing up of blood.              No-   change in color of mucus. +wheezing.   Skin: No-   rash or lesions. GI:  No-   heartburn, indigestion, abdominal pain, nausea, vomiting,  GU: MS:  No-   joint pain or swelling.   Neuro-     nothing unusual Psych:  No- change in mood or affect. No depression or anxiety.  No memory loss.    Objective:   Physical Exam    General- Alert, Oriented, Affect-appropriate, Distress- none acute, + morbid  obesity,  Skin- rash-none, lesions- none, excoriation- none Lymphadenopathy- none Head- atraumatic            Eyes- Gross vision intact, PERRLA, conjunctivae clear secretions            Ears- Hearing, canals-normal            Nose- Clear, no-Septal dev, mucus, polyps, erosion, perforation.  Throat- Mallampati III , mucosa clear , drainage- none, tonsils- atrophic,                           + Missing  teeth, +upper denture plate is out                  Neck- flexible , trachea+ stoma scar, no stridor , thyroid nl, carotid no bruit Chest - symmetrical  excursion , unlabored           Heart/CV- RRR , no murmur , no gallop  , no rub, nl s1 s2                           - JVD- none , edema- none, stasis changes- none, varices- none           Lung- +Diminished, . Wheeze -none, cough+minimal,  dullness-none,  rub- none            Chest wall Abd-  Br/ Gen/ Rectal- Not done, not indicated Extrem- cyanosis- none, clubbing, none, atrophy- none, strength- nl Neuro- grossly intact to observation

## 2021-10-03 NOTE — Telephone Encounter (Signed)
Requested medication (s) are due for refill today: yes  Requested medication (s) are on the active medication list: yes  Last refill:  11/29/20  Future visit scheduled: no  Notes to clinic:  Unable to refill per protocol, Rx expired.  Medication was discontinued 02/07/21 by ED MD,stop taking at discharge.     Requested Prescriptions  Pending Prescriptions Disp Refills   cloNIDine (CATAPRES) 0.3 MG tablet [Pharmacy Med Name: CLONIDINE 0.'3MG'$  TABLETS] 90 tablet 0    Sig: TAKE 1 TABLET BY MOUTH AT BEDTIME. OFFICE VISIT NEEDED FOR ADDITIONAL REFILLS     Cardiovascular:  Alpha-2 Agonists Failed - 10/02/2021  7:57 PM      Failed - Last BP in normal range    BP Readings from Last 1 Encounters:  09/07/21 (!) 161/85         Passed - Last Heart Rate in normal range    Pulse Readings from Last 1 Encounters:  09/07/21 (!) 106         Passed - Valid encounter within last 6 months    Recent Outpatient Visits           3 months ago Screening for colon cancer   [place], [place], MD   7 months ago Moderate persistent asthma with acute exacerbation   [facility] [place], Charlane Ferretti, MD   1 year ago Encounter for [employer] annual wellness exam   [place], Charlane Ferretti, MD   1 year ago Hot flashes   [place], Charlane Ferretti, MD   1 year ago Moderate persistent asthma with exacerbation   [place], MD       Future Appointments             In 2 days Deneise Lever, MD [facility]

## 2021-10-05 ENCOUNTER — Ambulatory Visit: Payer: Medicare Other | Admitting: Internal Medicine

## 2021-10-05 ENCOUNTER — Ambulatory Visit: Payer: Medicare Other

## 2021-10-08 ENCOUNTER — Encounter (HOSPITAL_COMMUNITY): Payer: Self-pay

## 2021-10-08 ENCOUNTER — Emergency Department (HOSPITAL_COMMUNITY): Payer: Medicare Other

## 2021-10-08 ENCOUNTER — Other Ambulatory Visit: Payer: Self-pay

## 2021-10-08 ENCOUNTER — Ambulatory Visit (INDEPENDENT_AMBULATORY_CARE_PROVIDER_SITE_OTHER): Payer: Medicare Other

## 2021-10-08 ENCOUNTER — Inpatient Hospital Stay (HOSPITAL_COMMUNITY)
Admission: EM | Admit: 2021-10-08 | Discharge: 2021-10-18 | DRG: 202 | Disposition: A | Discharge status: Home / Self Care | Payer: Medicare Other | Attending: Internal Medicine | Admitting: Internal Medicine

## 2021-10-08 VITALS — BP 162/102 | HR 97 | Temp 98.0°F | Resp 24 | Ht 66.0 in | Wt 323.8 lb

## 2021-10-08 DIAGNOSIS — D72828 Other elevated white blood cell count: Secondary | ICD-10-CM | POA: Diagnosis present

## 2021-10-08 DIAGNOSIS — E039 Hypothyroidism, unspecified: Secondary | ICD-10-CM | POA: Diagnosis present

## 2021-10-08 DIAGNOSIS — I5032 Chronic diastolic (congestive) heart failure: Secondary | ICD-10-CM | POA: Diagnosis present

## 2021-10-08 DIAGNOSIS — J9601 Acute respiratory failure with hypoxia: Secondary | ICD-10-CM

## 2021-10-08 DIAGNOSIS — I480 Paroxysmal atrial fibrillation: Secondary | ICD-10-CM | POA: Diagnosis present

## 2021-10-08 DIAGNOSIS — I10 Essential (primary) hypertension: Secondary | ICD-10-CM | POA: Diagnosis present

## 2021-10-08 DIAGNOSIS — J4552 Severe persistent asthma with status asthmaticus: Secondary | ICD-10-CM

## 2021-10-08 DIAGNOSIS — Z6841 Body Mass Index (BMI) 40.0 and over, adult: Secondary | ICD-10-CM | POA: Diagnosis not present

## 2021-10-08 DIAGNOSIS — Z825 Family history of asthma and other chronic lower respiratory diseases: Secondary | ICD-10-CM

## 2021-10-08 DIAGNOSIS — Z91148 Patient's other noncompliance with medication regimen for other reason: Secondary | ICD-10-CM

## 2021-10-08 DIAGNOSIS — J9600 Acute respiratory failure, unspecified whether with hypoxia or hypercapnia: Secondary | ICD-10-CM | POA: Diagnosis present

## 2021-10-08 DIAGNOSIS — J9621 Acute and chronic respiratory failure with hypoxia: Secondary | ICD-10-CM | POA: Diagnosis present

## 2021-10-08 DIAGNOSIS — Z79899 Other long term (current) drug therapy: Secondary | ICD-10-CM

## 2021-10-08 DIAGNOSIS — E785 Hyperlipidemia, unspecified: Secondary | ICD-10-CM | POA: Diagnosis present

## 2021-10-08 DIAGNOSIS — J45901 Unspecified asthma with (acute) exacerbation: Secondary | ICD-10-CM | POA: Diagnosis present

## 2021-10-08 DIAGNOSIS — G4733 Obstructive sleep apnea (adult) (pediatric): Secondary | ICD-10-CM | POA: Diagnosis present

## 2021-10-08 DIAGNOSIS — E876 Hypokalemia: Secondary | ICD-10-CM | POA: Diagnosis present

## 2021-10-08 DIAGNOSIS — G8929 Other chronic pain: Secondary | ICD-10-CM | POA: Diagnosis present

## 2021-10-08 DIAGNOSIS — F410 Panic disorder [episodic paroxysmal anxiety] without agoraphobia: Secondary | ICD-10-CM | POA: Diagnosis present

## 2021-10-08 DIAGNOSIS — F32A Depression, unspecified: Secondary | ICD-10-CM | POA: Diagnosis present

## 2021-10-08 DIAGNOSIS — M069 Rheumatoid arthritis, unspecified: Secondary | ICD-10-CM | POA: Diagnosis present

## 2021-10-08 DIAGNOSIS — R0600 Dyspnea, unspecified: Secondary | ICD-10-CM | POA: Diagnosis not present

## 2021-10-08 DIAGNOSIS — I110 Hypertensive heart disease with heart failure: Secondary | ICD-10-CM | POA: Diagnosis present

## 2021-10-08 DIAGNOSIS — Z8249 Family history of ischemic heart disease and other diseases of the circulatory system: Secondary | ICD-10-CM

## 2021-10-08 DIAGNOSIS — Z7901 Long term (current) use of anticoagulants: Secondary | ICD-10-CM

## 2021-10-08 DIAGNOSIS — J4551 Severe persistent asthma with (acute) exacerbation: Secondary | ICD-10-CM | POA: Diagnosis present

## 2021-10-08 DIAGNOSIS — Z20822 Contact with and (suspected) exposure to covid-19: Secondary | ICD-10-CM | POA: Diagnosis present

## 2021-10-08 DIAGNOSIS — K219 Gastro-esophageal reflux disease without esophagitis: Secondary | ICD-10-CM | POA: Diagnosis present

## 2021-10-08 DIAGNOSIS — E7800 Pure hypercholesterolemia, unspecified: Secondary | ICD-10-CM | POA: Diagnosis not present

## 2021-10-08 DIAGNOSIS — J449 Chronic obstructive pulmonary disease, unspecified: Secondary | ICD-10-CM | POA: Diagnosis present

## 2021-10-08 DIAGNOSIS — T501X6A Underdosing of loop [high-ceiling] diuretics, initial encounter: Secondary | ICD-10-CM | POA: Diagnosis present

## 2021-10-08 DIAGNOSIS — Z9981 Dependence on supplemental oxygen: Secondary | ICD-10-CM

## 2021-10-08 DIAGNOSIS — I878 Other specified disorders of veins: Secondary | ICD-10-CM | POA: Diagnosis present

## 2021-10-08 DIAGNOSIS — J4541 Moderate persistent asthma with (acute) exacerbation: Secondary | ICD-10-CM | POA: Diagnosis not present

## 2021-10-08 DIAGNOSIS — I5033 Acute on chronic diastolic (congestive) heart failure: Secondary | ICD-10-CM | POA: Diagnosis not present

## 2021-10-08 DIAGNOSIS — E559 Vitamin D deficiency, unspecified: Secondary | ICD-10-CM | POA: Diagnosis present

## 2021-10-08 DIAGNOSIS — I160 Hypertensive urgency: Secondary | ICD-10-CM | POA: Diagnosis present

## 2021-10-08 DIAGNOSIS — T380X5A Adverse effect of glucocorticoids and synthetic analogues, initial encounter: Secondary | ICD-10-CM | POA: Diagnosis present

## 2021-10-08 DIAGNOSIS — E038 Other specified hypothyroidism: Secondary | ICD-10-CM | POA: Diagnosis not present

## 2021-10-08 DIAGNOSIS — R739 Hyperglycemia, unspecified: Secondary | ICD-10-CM | POA: Diagnosis not present

## 2021-10-08 DIAGNOSIS — Z87891 Personal history of nicotine dependence: Secondary | ICD-10-CM

## 2021-10-08 DIAGNOSIS — Z79890 Hormone replacement therapy (postmenopausal): Secondary | ICD-10-CM

## 2021-10-08 DIAGNOSIS — Z7984 Long term (current) use of oral hypoglycemic drugs: Secondary | ICD-10-CM

## 2021-10-08 DIAGNOSIS — Z809 Family history of malignant neoplasm, unspecified: Secondary | ICD-10-CM

## 2021-10-08 LAB — HEPATIC FUNCTION PANEL
ALT: 12 U/L (ref 0–44)
AST: 13 U/L — ABNORMAL LOW (ref 15–41)
Albumin: 3.1 g/dL — ABNORMAL LOW (ref 3.5–5.0)
Alkaline Phosphatase: 63 U/L (ref 38–126)
Bilirubin, Direct: <0.1 mg/dL (ref 0.0–0.2)
Total Bilirubin: 0.2 mg/dL — ABNORMAL LOW (ref 0.3–1.2)
Total Protein: 6.8 g/dL (ref 6.5–8.1)

## 2021-10-08 LAB — BLOOD GAS, ARTERIAL
Acid-base deficit: 1.1 mmol/L (ref 0.0–2.0)
Bicarbonate: 23.5 mmol/L (ref 20.0–28.0)
Drawn by: 308601
O2 Content: 8 L/min
O2 Saturation: 100 %
Patient temperature: 37
pCO2 arterial: 38 mmHg (ref 32–48)
pH, Arterial: 7.4 (ref 7.35–7.45)
pO2, Arterial: 159 mmHg — ABNORMAL HIGH (ref 83–108)

## 2021-10-08 LAB — CBC
HCT: 38.3 % (ref 36.0–46.0)
Hemoglobin: 11.7 g/dL — ABNORMAL LOW (ref 12.0–15.0)
MCH: 25.3 pg — ABNORMAL LOW (ref 26.0–34.0)
MCHC: 30.5 g/dL (ref 30.0–36.0)
MCV: 82.9 fL (ref 80.0–100.0)
Platelets: 426 10*3/uL — ABNORMAL HIGH (ref 150–400)
RBC: 4.62 MIL/uL (ref 3.87–5.11)
RDW: 19.6 % — ABNORMAL HIGH (ref 11.5–15.5)
WBC: 18.2 10*3/uL — ABNORMAL HIGH (ref 4.0–10.5)
nRBC: 0 % (ref 0.0–0.2)

## 2021-10-08 LAB — BRAIN NATRIURETIC PEPTIDE: B Natriuretic Peptide: 36.1 pg/mL (ref 0.0–100.0)

## 2021-10-08 LAB — BASIC METABOLIC PANEL
Anion gap: 7 (ref 5–15)
BUN: 9 mg/dL (ref 6–20)
CO2: 27 mmol/L (ref 22–32)
Calcium: 8.6 mg/dL — ABNORMAL LOW (ref 8.9–10.3)
Chloride: 109 mmol/L (ref 98–111)
Creatinine, Ser: 0.81 mg/dL (ref 0.44–1.00)
GFR, Estimated: >60 mL/min (ref >60)
Glucose, Bld: 106 mg/dL — ABNORMAL HIGH (ref 70–99)
Potassium: 3.4 mmol/L — ABNORMAL LOW (ref 3.5–5.1)
Sodium: 143 mmol/L (ref 135–145)

## 2021-10-08 LAB — TROPONIN I (HIGH SENSITIVITY)
Troponin I (High Sensitivity): 7 ng/L (ref <18)
Troponin I (High Sensitivity): 7 ng/L (ref <18)

## 2021-10-08 LAB — SARS CORONAVIRUS 2 BY RT PCR: SARS Coronavirus 2 by RT PCR: NEGATIVE

## 2021-10-08 LAB — PHOSPHORUS: Phosphorus: 2.3 mg/dL — ABNORMAL LOW (ref 2.5–4.6)

## 2021-10-08 LAB — CK: Total CK: 108 U/L (ref 38–234)

## 2021-10-08 MED ORDER — POTASSIUM CHLORIDE CRYS ER 20 MEQ PO TBCR
40.0000 meq | EXTENDED_RELEASE_TABLET | Freq: Once | ORAL | Status: AC
  Administered 2021-10-08: 40 meq via ORAL
  Filled 2021-10-08: qty 2

## 2021-10-08 MED ORDER — MAGNESIUM SULFATE 2 GM/50ML IV SOLN
2.0000 g | Freq: Once | INTRAVENOUS | Status: AC
  Administered 2021-10-08: 2 g via INTRAVENOUS
  Filled 2021-10-08: qty 50

## 2021-10-08 MED ORDER — ALBUTEROL SULFATE (2.5 MG/3ML) 0.083% IN NEBU
10.0000 mg | INHALATION_SOLUTION | Freq: Once | RESPIRATORY_TRACT | Status: AC
  Administered 2021-10-08: 10 mg via RESPIRATORY_TRACT
  Filled 2021-10-08: qty 12

## 2021-10-08 MED ORDER — IPRATROPIUM-ALBUTEROL 0.5-2.5 (3) MG/3ML IN SOLN
3.0000 mL | Freq: Once | RESPIRATORY_TRACT | Status: AC
  Administered 2021-10-08: 3 mL via RESPIRATORY_TRACT
  Filled 2021-10-08: qty 3

## 2021-10-08 MED ORDER — METHYLPREDNISOLONE SODIUM SUCC 125 MG IJ SOLR
125.0000 mg | Freq: Once | INTRAMUSCULAR | Status: AC
  Administered 2021-10-08: 125 mg via INTRAVENOUS
  Filled 2021-10-08: qty 2

## 2021-10-08 MED ORDER — TEZEPELUMAB-EKKO 210 MG/1.91ML ~~LOC~~ SOSY
210.0000 mg | PREFILLED_SYRINGE | Freq: Once | SUBCUTANEOUS | Status: AC
  Administered 2021-10-08: 210 mg via SUBCUTANEOUS
  Filled 2021-10-08: qty 1.91

## 2021-10-08 NOTE — ED Triage Notes (Signed)
Pt reports chest pain and SHOB over the past few days. Hx of asthma. Pt reports using inhalers and nebs without relief. Pt went to PCP prior to arrival here and was sent here for further eval.

## 2021-10-08 NOTE — Telephone Encounter (Addendum)
Left VM for patient regarding Tezspire self-admin training. She received injection today at infusion center and stated she was open to training for self-admin. She would be due for next Tezspire dose on 11/05/21.  Rx will need to be sent to [facility] to be couriered to clinic for training visit.  [name], PharmD, MPH, BCPS, CPP Clinical Pharmacist (Rheumatology and Pulmonology)

## 2021-10-08 NOTE — Telephone Encounter (Signed)
F/U: Tezspire self admin.  Spoke with patient and she is agreeable to self inject.  Informed patient that we will contact her to schedule appt for self training.  Patient agreed and had no questions at that time.

## 2021-10-08 NOTE — Subjective & Objective (Signed)
Patient with known history of severe asthma COPD complaining of shortness of breath and chest pain.  Happening for the past few days feels like asthma exacerbation.  She has been wheezing and using her inhalers and nebulizer but does not seem to help.  She went to her primary care provider and gotten an injection for her asthma.  Her breathing is worse with exertion Chest pain nonradiating. No associated fevers or chills She has been taking recent steroids for asthma exacerbation finished on Friday 3 days ago

## 2021-10-08 NOTE — Assessment & Plan Note (Signed)
this patient has acute respiratory failure with Hypoxia and   as documented by the presence of following: O2 saturatio< 90% on RA Likely due to: Asthma exacerbation,   Provide O2 therapy and titrate as needed  Continuous pulse ox  check Pulse ox with ambulation prior to discharge   may need  TC consult for home O2 set up  flutter valve ordered

## 2021-10-08 NOTE — Assessment & Plan Note (Addendum)
Continue Lasix 40 mg a day avoid aggressive fluid resuscitation.  Current appears to be euvolemic

## 2021-10-08 NOTE — Progress Notes (Signed)
Diagnosis: Asthma  Provider:  Praveen Mannam, MD  Procedure: Injection  Tezspire (Tezepelumab), Dose: 210 mg, Site: subcutaneous, Number of injections: 1  Discharge: Condition: Good, Destination: Home . AVS provided to patient.   Performed by:  Sneha Willig E Aislee Landgren, LPN       

## 2021-10-08 NOTE — Assessment & Plan Note (Signed)
-  -   Will initiate: Steroid taper     - Albuterol  PRN, - scheduled duoneb,  -  Breo or Dulera at discharge   -  Mucinex.  Titrate O2 to saturation >90%. Follow patients respiratory status.     ABG showing no hypercarbia, Although Co2 normalizing is noted, cont to monitor closely -   Currently mentating well no evidence of symptomatic hypercarbia PCCM to see in AM

## 2021-10-08 NOTE — Assessment & Plan Note (Signed)
Administer K. Dur 40 mg p.o. once and recheck in a.m.

## 2021-10-08 NOTE — Assessment & Plan Note (Signed)
Chronic stable continue Lipitor 20 mg daily

## 2021-10-08 NOTE — ED Notes (Signed)
Patient transported to CT 

## 2021-10-08 NOTE — ED Notes (Signed)
RT at bedside.

## 2021-10-08 NOTE — ED Provider Notes (Signed)
[facility] DEPT Provider Note   CSN: [id number] Arrival date & time: 10/08/21  1647     History  Chief Complaint  Patient presents with   Chest Pain   Shortness of Breath    April [name] is a 51 y.o. female with past medical history significant for severe asthma, COPD, paroxysmal A-fib who is currently anticoagulated on Eliquis, acid reflux, severe obesity with BMI greater than 50, congestive heart failure who presents with concern for chest pain shortness of breath over the last few days.  She is not sure what may have exacerbated her asthma.  She reports that she has been wheezing, using inhalers and nebulizers without relief.  She went to her PCP prior to arrival to get her monthly injection for asthma.  She also uses albuterol as well as Trelegy.  She reports breathing worse with exertion.  Denies radiation of chest pain into left arm, nausea, vomiting.  She denies any fever, chills.  Reports that she just recently finished a steroid taper on Friday.   Chest Pain Associated symptoms: shortness of breath   Shortness of Breath Associated symptoms: chest pain        Home Medications Prior to Admission medications   Medication Sig Start Date End Date Taking? Authorizing Provider  amLODipine (NORVASC) 10 MG tablet TAKE 1 TABLET(10 MG) BY MOUTH DAILY 08/27/21   [name], MD  albuterol (PROAIR HFA) 108 (90 Base) MCG/ACT inhaler Inhale 2 puffs into the lungs every 6 (six) hours as needed for wheezing or shortness of breath. 06/26/21   Deneise Lever, MD  apixaban (ELIQUIS) 5 MG TABS tablet Take 1 tablet (5 mg total) by mouth 2 (two) times daily. 06/06/21   [name], MD  atorvastatin (LIPITOR) 20 MG tablet Take 20 mg by mouth every morning. 10/02/20   [provider]  baclofen (LIORESAL) 20 MG tablet Take 20 mg by mouth daily as needed for muscle spasms (pain). 01/08/21   [provider]  carvedilol (COREG) 6.25 MG tablet Take 1  tablet (6.25 mg total) by mouth 2 (two) times daily with a meal. 06/06/21   [name], MD  clonazePAM (KLONOPIN) 0.5 MG tablet Take 1 tablet (0.5 mg total) by mouth daily as needed for anxiety. 09/13/21   Baird Lyons D, MD  cloNIDine (CATAPRES) 0.2 MG tablet Take 0.2 mg by mouth 2 (two) times daily.    [provider]  Diclofenac Sodium 3 % GEL Apply 1 application. topically daily as needed (pain). 08/03/21   [provider]  DULoxetine (CYMBALTA) 60 MG capsule Take 1 capsule (60 mg total) by mouth daily. 06/06/21   [name], MD  EPINEPHrine 0.3 mg/0.3 mL IJ SOAJ injection Inject 0.3 mg into the muscle as needed for anaphylaxis. 05/30/20   Baird Lyons D, MD  fluticasone (FLONASE) 50 MCG/ACT nasal spray SHAKE LIQUID AND USE 1 SPRAY IN EACH NOSTRIL DAILY AS NEEDED FOR ALLERGIES OR RHINITIS Patient taking differently: 1 spray daily. 08/05/21   [name], MD  Fluticasone-Umeclidin-Vilant (TRELEGY ELLIPTA) 100-62.5-25 MCG/INH AEPB Inhale 1 puff into the lungs daily. 01/23/21   Deneise Lever, MD  furosemide (LASIX) 40 MG tablet Take 1 tablet (40 mg total) by mouth daily. 02/23/20   [name], MD  Ipratropium-Albuterol (COMBIVENT) 20-100 MCG/ACT AERS respimat Inhale 1 puff, every 6 hours as needed, rescue Patient taking differently: 1 puff every 6 (six) hours as needed for wheezing or shortness of breath. 07/31/21   Baird Lyons D,  MD  ipratropium-albuterol (DUONEB) 0.5-2.5 (3) MG/3ML SOLN Take 3 mLs by nebulization every 6 (six) hours as needed (shortness of breath/wheezing). 02/15/21   Deneise Lever, MD  levothyroxine (SYNTHROID) 50 MCG tablet Take 50 mcg by mouth daily. 10/15/20   [provider]  montelukast (SINGULAIR) 10 MG tablet Take 1 tablet (10 mg total) by mouth at bedtime. 09/13/21   [name], MD  naloxone [facility]) nasal spray 4 mg/0.1 mL Place 1 spray into the nose once as needed (opioid overdose). 08/09/20   [provider]   omeprazole (PRILOSEC) 40 MG capsule Take 40 mg by mouth daily.    [provider]  oxyCODONE-acetaminophen (PERCOCET) 10-325 MG tablet Take 1 tablet by mouth 5 (five) times daily as needed (nerve pain). 08/01/21   [provider]  phentermine 15 MG capsule Take 15 mg by mouth daily. 08/10/21   [provider]  potassium chloride SA (KLOR-CON M) 20 MEQ tablet TAKE 1 TABLET( 20 MEQ TOTAL) BY MOUTH DAILY 09/27/21   [name], MD  predniSONE (DELTASONE) 10 MG tablet Prednisone dosing: Take  Prednisone '40mg'$  (4 tabs) x 3 days, then taper to '30mg'$  (3 tabs) x 3 days, then '20mg'$  (2 tabs) x 3days, then '10mg'$  (1 tab) x 3days, then OFF. 09/13/21   Young, Clinton D, MD  predniSONE (DELTASONE) 10 MG tablet 4 tabs for 2 days, then 3 tabs for 2 days, 2 tabs for 2 days, then 1 tab for 2 days, then stop 09/28/21   Parrett, Tammy S, NP  pregabalin (LYRICA) 300 MG capsule Take 300 mg by mouth 2 (two) times daily. 01/08/21   [provider]  Semaglutide, 1 MG/DOSE, (OZEMPIC, 1 MG/DOSE,) 4 MG/3ML SOPN Inject 1 mg into the skin every Monday.    [provider]  tezepelumab-ekko (TEZSPIRE) 210 MG/1.91ML syringe Inject 210 mg into the skin every 28 (twenty-eight) days. 04/12/21   [provider]  topiramate (TOPAMAX) 200 MG tablet Take 200 mg by mouth at bedtime. 01/08/21   [provider]  zolpidem (AMBIEN) 10 MG tablet TAKE 1 TABLET BY MOUTH AT BEDTIME AS NEEDED FOR SLEEP Patient taking differently: Take 10 mg by mouth at bedtime. 07/25/21   Deneise Lever, MD  mometasone-formoterol (DULERA) 200-5 MCG/ACT AERO Inhale 2 puffs into the lungs 2 (two) times daily. Dx: Asthma 04/08/19 08/12/19  Deneise Lever, MD      Allergies    Contrast media [iodinated contrast media]    Review of Systems   Review of Systems  Respiratory:  Positive for shortness of breath.   Cardiovascular:  Positive for chest pain.    Physical Exam Updated Vital Signs BP (!) 186/105    Pulse 80   Temp 98.8 F (37.1 C) (Oral)   Resp (!) 23   SpO2 100%  Physical Exam Vitals and nursing note reviewed.  Constitutional:      General: She is in acute distress.     Appearance: Normal appearance. She is obese.  HENT:     Head: Normocephalic and atraumatic.  Eyes:     General:        Right eye: No discharge.        Left eye: No discharge.  Cardiovascular:     Rate and Rhythm: Normal rate and regular rhythm.     Heart sounds: No murmur heard.    No friction rub. No gallop.  Pulmonary:     Effort: Tachypnea and respiratory distress present.     Breath  sounds: Normal breath sounds.     Comments: Biphasic wheezing with no focal consolidation.  Patient does have some tachypnea, respiratory distress.  Is improved on reevaluation, but still with biphasic wheezing and some minimal tachypnea. Abdominal:     General: Bowel sounds are normal.     Palpations: Abdomen is soft.  Skin:    General: Skin is warm and dry.     Capillary Refill: Capillary refill takes less than 2 seconds.  Neurological:     Mental Status: She is alert and oriented to person, place, and time.  Psychiatric:        Mood and Affect: Mood normal.        Behavior: Behavior normal.     ED Results / Procedures / Treatments   Labs (all labs ordered are listed, but only abnormal results are displayed) Labs Reviewed  CBC - Abnormal; Notable for the following components:      Result Value   WBC 18.2 (*)    Hemoglobin 11.7 (*)    MCH 25.3 (*)    RDW 19.6 (*)    Platelets 426 (*)    All other components within normal limits  BASIC METABOLIC PANEL - Abnormal; Notable for the following components:   Potassium 3.4 (*)    Glucose, Bld 106 (*)    Calcium 8.6 (*)    All other components within normal limits  SARS CORONAVIRUS 2 BY RT PCR  BRAIN NATRIURETIC PEPTIDE  CK  HEPATIC FUNCTION PANEL  PHOSPHORUS  TSH  BLOOD GAS, ARTERIAL  TROPONIN I (HIGH SENSITIVITY)  TROPONIN I (HIGH SENSITIVITY)     EKG None  Radiology DG Chest 2 View  Result Date: 10/08/2021 CLINICAL DATA:  Shortness of breath, chest pain EXAM: CHEST - 2 VIEW COMPARISON:  08/26/2021 FINDINGS: The heart size and mediastinal contours are within normal limits. Both lungs are clear. The visualized skeletal structures are unremarkable. IMPRESSION: No active cardiopulmonary disease. Electronically Signed   By: Rolm Baptise M.D.   On: 10/08/2021 19:14    Procedures Procedures    Medications Ordered in ED Medications  potassium chloride SA (KLOR-CON M) CR tablet 40 mEq (has no administration in time range)  methylPREDNISolone sodium succinate (SOLU-MEDROL) 125 mg/2 mL injection 125 mg (125 mg Intravenous Given 10/08/21 1830)  magnesium sulfate IVPB 2 g 50 mL (0 g Intravenous Stopped 10/08/21 2001)  ipratropium-albuterol (DUONEB) 0.5-2.5 (3) MG/3ML nebulizer solution 3 mL (3 mLs Nebulization Given 10/08/21 1830)  albuterol (PROVENTIL) (2.5 MG/3ML) 0.083% nebulizer solution 10 mg (10 mg Nebulization Given 10/08/21 2004)  albuterol (PROVENTIL) (2.5 MG/3ML) 0.083% nebulizer solution 10 mg (10 mg Nebulization Given 10/08/21 2218)    ED Course/ Medical Decision Making/ A&P                           Medical Decision Making Amount and/or Complexity of Data Reviewed Labs: ordered. Radiology: ordered.  Risk Prescription drug management. Decision regarding hospitalization.   This patient is a 51 y.o. female who presents to the ED for concern of shortness of breath, wheezing, this involves an extensive number of treatment options, and is a complaint that carries with it a high risk of complications and morbidity. The emergent differential diagnosis prior to evaluation includes, but is not limited to, asthma exacerbation, COPD exacerbation, pneumonia, ARDS, pulmonary embolism, atypical ACS, AAS, versus other.   This is not an exhaustive differential.   Past Medical History / Co-morbidities / Social History: severe asthma,  COPD, paroxysmal A-fib who is currently anticoagulated on Eliquis, acid reflux, severe obesity with BMI greater than 50, congestive heart failure  Additional history: Chart reviewed. Pertinent results include: Reviewed lab work, imaging from recent emergency department visits, reviewed outpatient pulmonology and PCP notes including patient received recent steroid taper that just ended on Friday, patient is receiving monthly injection, Tezspire for her asthma in addition to using Trelegy and albuterol  Physical Exam: Physical exam performed. The pertinent findings include: Patient with biphasic wheezing, tachypnea, respiratory distress on arrival.  On reevaluation she continues to have biphasic wheezing with some improvement of her tachypnea, respiratory distress, but she remains uncomfortable with minimal tachypnea.  Lab Tests: I ordered, and personally interpreted labs.  The pertinent results include: BMP overall unremarkable, mild hypokalemia potassium 3.4.  CBC with mild anemia, hemoglobin 11.7.  She has significant leukocytosis with white blood cells 18.2.  Likely multifactorial may be related to asthma exacerbation, but also likely due to recent steroid taper.   Imaging Studies: I ordered imaging studies including plain film chest x-ray. I independently visualized and interpreted imaging which showed no acute intrathoracic abnormality. I agree with the radiologist interpretation.   Cardiac Monitoring:  The patient was maintained on a cardiac monitor.  My attending physician Dr. Alvino Chapel viewed and interpreted the cardiac monitored which showed an underlying rhythm of: NSR, RAD, likely old infarct noted. I agree with this interpretation.   Medications: I ordered medication including Solu-Medrol, mag, DuoNeb, continuous neb x2 for asthma exacerbation. Reevaluation of the patient after these medicines showed that the patient improved.  Patient with some improvement but is still in respiratory  distress, still Tachypneic, think she will need admission at this point.  Consultations Obtained: I requested consultation with the hospitalist, Dr. Roel Cluck,  and discussed lab and imaging findings as well as pertinent plan - they recommend: Admission.  We will obtain an ABG.  At this time patient with no significant abnormality of bicarb, no anion gap.  I discussed this case with my attending physician Dr. Alvino Chapel who cosigned this note including patient's presenting symptoms, physical exam, and planned diagnostics and interventions. Attending physician stated agreement with plan or made changes to plan which were implemented.    Final Clinical Impression(s) / ED Diagnoses Final diagnoses:  None    Rx / DC Orders ED Discharge Orders     None         Dorien Chihuahua 10/08/21 2228    Davonna Belling, MD 10/09/21 0003

## 2021-10-08 NOTE — Assessment & Plan Note (Signed)
Continue Eliquis for now monitor for any sign of bleeding.  Hold off on Coreg for tonight given wheezing if needed may switch to [employer]

## 2021-10-08 NOTE — Assessment & Plan Note (Signed)
Morbid obesity.  Will need to follow-up as an outpatient nutrition

## 2021-10-08 NOTE — H&P (Signed)
April [name]:[id number] DOB: 23-Oct-1970 DOA: 10/08/2021     PCP: [name], MD   Outpatient Specialists:     Pulmonary    Dr.YOung    Patient arrived to ER on 10/08/21 at 1647 Referred by Attending Davonna Belling, MD   Patient coming from:    home Lives alone,     Chief Complaint:   Chief Complaint  Patient presents with   Chest Pain   Shortness of Breath    HPI: April [name] is a 51 y.o. female with medical history significant of asthma, COPD, or A-fib on Eliquis, CHF, GERD, obesity,    Presented with chest pain shortness of breath asthma exacerbation Patient with known history of severe asthma COPD complaining of shortness of breath and chest pain.  Happening for the past few days feels like asthma exacerbation.  She has been wheezing and using her inhalers and nebulizer but does not seem to help.  She went to her primary care provider and gotten an injection for her asthma.  Her breathing is worse with exertion Chest pain nonradiating. No associated fevers or chills She has been taking recent steroids for asthma exacerbation finished on Friday 3 days ago   Does not smoke no drinks last intubation for asthma was years ago  She uses 4 L of O2 at night does not use CPAP  Regarding pertinent Chronic problems:     Hyperlipidemia - on statins lipitor Lipid Panel     Component Value Date/Time   CHOL 238 (H) 03/30/2020 1128   TRIG 101 02/03/2021 2015   HDL 45 03/30/2020 1128   CHOLHDL 5.3 (H) 03/30/2020 1128   CHOLHDL 3.9 Ratio 10/06/2009 2113   VLDL 13 10/06/2009 2113   LDLCALC 167 (H) 03/30/2020 1128   LABVLDL 26 03/30/2020 1128     HTN on coreg, clonidine   chronic CHF diastolic  - last echo JAn 2023     Hypothyroidism:  Lab Results  Component Value Date   TSH 3.090 01/22/2018   on synthroid    Morbid obesity-   BMI Readings from Last 1 Encounters:  10/08/21 52.26 kg/m       Asthma - home inhalers/ nebs                           Prior  admissions, frequent ER visits                        history of intubation  On [place],  OSA -on nocturnal oxygen,        A. Fib -  - CHA2DS2 vas score  4     current  on anticoagulation with Eliquis,           -  Rate control:  Currently controlled with  Coreg      Chronic anemia - baseline hg Hemoglobin & Hematocrit  Recent Labs    08/27/21 0433 08/28/21 0600 10/08/21 1802  HGB 12.4 11.4* 11.7*    While in ER:   Given IV MAg  solumedrol Still wheezing      CXR - No active cardiopulmonary disease.     Following Medications were ordered in ER: Medications  albuterol (PROVENTIL) (2.5 MG/3ML) 0.083% nebulizer solution 10 mg (has no administration in time range)  methylPREDNISolone sodium succinate (SOLU-MEDROL) 125 mg/2 mL injection 125 mg (125 mg Intravenous Given 10/08/21 1830)  magnesium sulfate IVPB 2 g 50 mL (  0 g Intravenous Stopped 10/08/21 2001)  ipratropium-albuterol (DUONEB) 0.5-2.5 (3) MG/3ML nebulizer solution 3 mL (3 mLs Nebulization Given 10/08/21 1830)  albuterol (PROVENTIL) (2.5 MG/3ML) 0.083% nebulizer solution 10 mg (10 mg Nebulization Given 10/08/21 2004)       ED Triage Vitals  Enc Vitals Group     BP 10/08/21 1700 (!) 152/89     Pulse Rate 10/08/21 1700 (!) 108     Resp 10/08/21 1700 (!) 24     Temp 10/08/21 1700 98.8 F (37.1 C)     Temp Source 10/08/21 1700 Oral     SpO2 10/08/21 1700 98 %     Weight --      Height --      Head Circumference --      Peak Flow --      Pain Score 10/08/21 1709 8     Pain Loc --      Pain Edu? --      Excl. in [place]? --   TMAX(24)@     _________________________________________ Significant initial  Findings: Abnormal Labs Reviewed  CBC - Abnormal; Notable for the following components:      Result Value   WBC 18.2 (*)    Hemoglobin 11.7 (*)    MCH 25.3 (*)    RDW 19.6 (*)    Platelets 426 (*)    All other components within normal limits  BASIC METABOLIC PANEL - Abnormal; Notable for the following  components:   Potassium 3.4 (*)    Glucose, Bld 106 (*)    Calcium 8.6 (*)    All other components within normal limits     _________________________ Troponin 7 -7  ECG: Ordered Personally reviewed by me showing: HR : 106 Rhythm:Sinus tachycardia Right atrial enlargement Anterior infarct, old QTC 469   The recent clinical data is shown below. Vitals:   10/08/21 1900 10/08/21 1934 10/08/21 2030 10/08/21 2100  BP: (!) 168/105 (!) 175/94 (!) 186/124 (!) 186/105  Pulse: 99 95 90 80  Resp: (!) 22 (!) 21 (!) 22 (!) 23  Temp:      TempSrc:      SpO2: 100% 100% 99% 100%       WBC     Component Value Date/Time   WBC 18.2 (H) 10/08/2021 1802   LYMPHSABS 2.6 08/28/2021 0600   LYMPHSABS 3.3 (H) 01/22/2018 1403   MONOABS 1.7 (H) 08/28/2021 0600   EOSABS 0.0 08/28/2021 0600   EOSABS 0.3 01/22/2018 1403   BASOSABS 0.0 08/28/2021 0600   BASOSABS 0.1 01/22/2018 1403        _______________________________________________ Hospitalist was called for admission for asthma exacerbation   The following Work up has been ordered so far:  Orders Placed This Encounter  Procedures   DG Chest 2 View   CBC   Basic metabolic panel   Brain natriuretic peptide   Patient may eat/drink   Consult to hospitalist   ED EKG     OTHER Significant initial  Findings:  labs showing:    Recent Labs  Lab 10/08/21 1802  NA 143  K 3.4*  CO2 27  GLUCOSE 106*  BUN 9  CREATININE 0.81  CALCIUM 8.6*    Cr    stable,    Lab Results  Component Value Date   CREATININE 0.81 10/08/2021   CREATININE 0.88 08/28/2021   CREATININE 0.81 08/27/2021    Recent Labs  Lab 10/08/21 2009  AST 13*  ALT 12  ALKPHOS 63  BILITOT 0.2*  PROT 6.8  ALBUMIN 3.1*   Lab Results  Component Value Date   CALCIUM 8.6 (L) 10/08/2021   PHOS 2.2 (L) 05/24/2021       Plt: Lab Results  Component Value Date   PLT 426 (H) 10/08/2021       COVID-19 Labs  No results for input(s): "DDIMER", "FERRITIN",  "LDH", "CRP" in the last 72 hours.  Lab Results  Component Value Date   SARSCOV2NAA NEGATIVE 08/26/2021   SARSCOV2NAA NEGATIVE 08/23/2021   SARSCOV2NAA NEGATIVE 05/22/2021   SARSCOV2NAA POSITIVE (A) 01/25/2021       ABG    Component Value Date/Time   PHART 7.4 10/08/2021 2225   PCO2ART 38 10/08/2021 2225   PO2ART 159 (H) 10/08/2021 2225   HCO3 23.5 10/08/2021 2225   TCO2 29 02/20/2019 0410   ACIDBASEDEF 1.1 10/08/2021 2225   O2SAT 100 10/08/2021 2225         Recent Labs  Lab 10/08/21 1802  WBC 18.2*  HGB 11.7*  HCT 38.3  MCV 82.9  PLT 426*    HG/HCT stable,      Component Value Date/Time   HGB 11.7 (L) 10/08/2021 1802   HGB 10.4 (L) 01/22/2018 1403   HCT 38.3 10/08/2021 1802   HCT 33.3 (L) 01/22/2018 1403   MCV 82.9 10/08/2021 1802   MCV 82 01/22/2018 1403     Cardiac Panel (last 3 results) Recent Labs    10/08/21 2009  CKTOTAL 108    .car BNP (last 3 results) Recent Labs    08/26/21 1621 08/29/21 1514 10/08/21 1802  BNP 32.6 21.8 36.1      DM  labs:  HbA1C: Recent Labs    02/05/21 1644 05/24/21 0156  HGBA1C 5.9* 5.9*       CBG (last 3)  No results for input(s): "GLUCAP" in the last 72 hours.        Cultures:    Component Value Date/Time   SDES BLOOD RIGHT ARM 02/04/2021 0353   SPECREQUEST  02/04/2021 0353    BOTTLES DRAWN AEROBIC AND ANAEROBIC Blood Culture adequate volume   CULT  02/04/2021 0353    NO GROWTH 5 DAYS Performed at [facility], [place] [address], [place], [place] [address]    REPTSTATUS 02/09/2021 FINAL 02/04/2021 0353     Radiological Exams on Admission: DG Chest 2 View  Result Date: 10/08/2021 CLINICAL DATA:  Shortness of breath, chest pain EXAM: CHEST - 2 VIEW COMPARISON:  08/26/2021 FINDINGS: The heart size and mediastinal contours are within normal limits. Both lungs are clear. The visualized skeletal structures are unremarkable. IMPRESSION: No active cardiopulmonary disease. Electronically Signed    By: Rolm Baptise M.D.   On: 10/08/2021 19:14   _______________________________________________________________________________________________________ Latest  Blood pressure (!) 186/105, pulse 80, temperature 98.8 F (37.1 C), temperature source Oral, resp. rate (!) 23, SpO2 100 %.   Vitals  labs and radiology finding personally reviewed  Review of Systems:    Pertinent positives include:   shortness of breath at rest.   dyspnea on exertion Headache non-productive cough, coughing up of blood.but also had nose bleed wheezing. Constitutional:  No weight loss, night sweats, Fevers, chills, fatigue, weight loss  HEENT:  No headaches, Difficulty swallowing,Tooth/dental problems,Sore throat,  No sneezing, itching, ear ache, nasal congestion, post nasal drip,  Cardio-vascular:  No chest pain, Orthopnea, PND, anasarca, dizziness, palpitations.no Bilateral lower extremity swelling  GI:  No heartburn, indigestion, abdominal pain, nausea, vomiting, diarrhea, change in bowel habits, loss of appetite,  melena, blood in stool, hematemesis Resp:    No excess mucus, no productive cough,   No change in color of mucus.No  Skin:  no rash or lesions. No jaundice GU:  no dysuria, change in color of urine, no urgency or frequency. No straining to urinate.  No flank pain.  Musculoskeletal:  No joint pain or no joint swelling. No decreased range of motion. No back pain.  Psych:  No change in mood or affect. No depression or anxiety. No memory loss.  Neuro: no localizing neurological complaints, no tingling, no weakness, no double vision, no gait abnormality, no slurred speech, no confusion  All systems reviewed and apart from [place] all are negative _______________________________________________________________________________________________ Past Medical History:   Past Medical History:  Diagnosis Date   Acanthosis nigricans    Anxiety    Arthritis    "knees" (04/28/2017)   Asthma    Followed by  Dr. Annamaria Boots (pulmonology); receives every other week omalizumab injections; has frequent exacerbations   Back pain    Chronic diastolic CHF (congestive heart failure) ([place]) 01/17/2017   COPD (chronic obstructive pulmonary disease) ([place])    PFTs in 2002, FEV1/FVC 65, no post bronchodilater test done   Depression    GERD (gastroesophageal reflux disease)    Headache(784.0)    "q couple days" (51/05/5850)   Helicobacter pylori (H. pylori) infection    Hypertension, essential    Insomnia    Joint pain    Lower extremity edema    Menorrhagia    Morbid obesity ([place])    OSA on CPAP    Sleep study 2008 - mild OSA, not enough events to titrate CPAP; wears CPAP now/pt on 04/28/2017   Pneumonia X 1   Prediabetes    Rheumatoid arthritis (HCC)    Seasonal allergies    Shortness of breath    Tobacco user    Vitamin D deficiency      Past Surgical History:  Procedure Laterality Date   CARDIOVERSION N/A 05/30/2017   Procedure: CARDIOVERSION;  Surgeon: Sanda Klein, MD;  Location: [facility];  Service: Cardiovascular;  Laterality: N/A;   REDUCTION MAMMAPLASTY Bilateral 09/2011   TUBAL LIGATION  1996   bilateral    Social History:  Ambulatory   independently      reports that she quit smoking about 7 years ago. Her smoking use included cigarettes. She has a 13.00 pack-year smoking history. She has never used smokeless tobacco. She reports that she does not drink alcohol and does not use drugs.     Family History:  Family History  Problem Relation Age of Onset   Hypertension Mother    Asthma Daughter    Cancer Paternal Aunt    Asthma Maternal Grandmother    ______________________________________________________________________________________________ Allergies: Allergies  Allergen Reactions   Contrast Media [Iodinated Contrast Media] Itching    Ct contrast     Prior to Admission medications   Medication Sig Start Date End Date Taking? Authorizing Provider  amLODipine  (NORVASC) 10 MG tablet TAKE 1 TABLET(10 MG) BY MOUTH DAILY 08/27/21   [name], MD  albuterol (PROAIR HFA) 108 (90 Base) MCG/ACT inhaler Inhale 2 puffs into the lungs every 6 (six) hours as needed for wheezing or shortness of breath. 06/26/21   Deneise Lever, MD  apixaban (ELIQUIS) 5 MG TABS tablet Take 1 tablet (5 mg total) by mouth 2 (two) times daily. 06/06/21   [name], MD  atorvastatin (LIPITOR) 20 MG tablet Take 20 mg by mouth every morning.  10/02/20   [provider]  baclofen (LIORESAL) 20 MG tablet Take 20 mg by mouth daily as needed for muscle spasms (pain). 01/08/21   [provider]  carvedilol (COREG) 6.25 MG tablet Take 1 tablet (6.25 mg total) by mouth 2 (two) times daily with a meal. 06/06/21   [name], MD  clonazePAM (KLONOPIN) 0.5 MG tablet Take 1 tablet (0.5 mg total) by mouth daily as needed for anxiety. 09/13/21   Baird Lyons D, MD  cloNIDine (CATAPRES) 0.2 MG tablet Take 0.2 mg by mouth 2 (two) times daily.    [provider]  Diclofenac Sodium 3 % GEL Apply 1 application. topically daily as needed (pain). 08/03/21   [provider]  DULoxetine (CYMBALTA) 60 MG capsule Take 1 capsule (60 mg total) by mouth daily. 06/06/21   [name], MD  EPINEPHrine 0.3 mg/0.3 mL IJ SOAJ injection Inject 0.3 mg into the muscle as needed for anaphylaxis. 05/30/20   Baird Lyons D, MD  fluticasone (FLONASE) 50 MCG/ACT nasal spray SHAKE LIQUID AND USE 1 SPRAY IN EACH NOSTRIL DAILY AS NEEDED FOR ALLERGIES OR RHINITIS Patient taking differently: 1 spray daily. 08/05/21   [name], MD  Fluticasone-Umeclidin-Vilant (TRELEGY ELLIPTA) 100-62.5-25 MCG/INH AEPB Inhale 1 puff into the lungs daily. 01/23/21   Deneise Lever, MD  furosemide (LASIX) 40 MG tablet Take 1 tablet (40 mg total) by mouth daily. 02/23/20   [name], MD  Ipratropium-Albuterol (COMBIVENT) 20-100 MCG/ACT AERS respimat Inhale 1 puff, every 6 hours as needed,  rescue Patient taking differently: 1 puff every 6 (six) hours as needed for wheezing or shortness of breath. 07/31/21   Baird Lyons D, MD  ipratropium-albuterol (DUONEB) 0.5-2.5 (3) MG/3ML SOLN Take 3 mLs by nebulization every 6 (six) hours as needed (shortness of breath/wheezing). 02/15/21   Deneise Lever, MD  levothyroxine (SYNTHROID) 50 MCG tablet Take 50 mcg by mouth daily. 10/15/20   [provider]  montelukast (SINGULAIR) 10 MG tablet Take 1 tablet (10 mg total) by mouth at bedtime. 09/13/21   [name], MD  naloxone [facility]) nasal spray 4 mg/0.1 mL Place 1 spray into the nose once as needed (opioid overdose). 08/09/20   [provider]  omeprazole (PRILOSEC) 40 MG capsule Take 40 mg by mouth daily.    [provider]  oxyCODONE-acetaminophen (PERCOCET) 10-325 MG tablet Take 1 tablet by mouth 5 (five) times daily as needed (nerve pain). 08/01/21   [provider]  phentermine 15 MG capsule Take 15 mg by mouth daily. 08/10/21   [provider]  potassium chloride SA (KLOR-CON M) 20 MEQ tablet TAKE 1 TABLET( 20 MEQ TOTAL) BY MOUTH DAILY 09/27/21   [name], MD  predniSONE (DELTASONE) 10 MG tablet Prednisone dosing: Take  Prednisone '40mg'$  (4 tabs) x 3 days, then taper to '30mg'$  (3 tabs) x 3 days, then '20mg'$  (2 tabs) x 3days, then '10mg'$  (1 tab) x 3days, then OFF. 09/13/21   Young, Clinton D, MD  predniSONE (DELTASONE) 10 MG tablet 4 tabs for 2 days, then 3 tabs for 2 days, 2 tabs for 2 days, then 1 tab for 2 days, then stop 09/28/21   Parrett, Tammy S, NP  pregabalin (LYRICA) 300 MG capsule Take 300 mg by mouth 2 (two) times daily. 01/08/21   [provider]  Semaglutide, 1 MG/DOSE, (OZEMPIC, 1 MG/DOSE,) 4 MG/3ML SOPN Inject 1 mg into the skin every Monday.    [provider]  tezepelumab-ekko (TEZSPIRE) 210 MG/1.91ML syringe  Inject 210 mg into the skin every 28 (twenty-eight) days. 04/12/21   [provider]  topiramate  (TOPAMAX) 200 MG tablet Take 200 mg by mouth at bedtime. 01/08/21   [provider]  zolpidem (AMBIEN) 10 MG tablet TAKE 1 TABLET BY MOUTH AT BEDTIME AS NEEDED FOR SLEEP Patient taking differently: Take 10 mg by mouth at bedtime. 07/25/21   Deneise Lever, MD  mometasone-formoterol (DULERA) 200-5 MCG/ACT AERO Inhale 2 puffs into the lungs 2 (two) times daily. Dx: Asthma 04/08/19 08/12/19  Baird Lyons D, MD    ___________________________________________________________________________________________________ Physical Exam:    10/08/2021    9:00 PM 10/08/2021    8:30 PM 10/08/2021    7:34 PM  Vitals with BMI  Systolic 482 500 370  Diastolic 488 891 94  Pulse 80 90 95     1. General:  in No  Acute distress    Chronically ill   -appearing 2. Psychological: Alert and   Oriented 3. Head/ENT:    Dry Mucous Membranes                          Head Non traumatic, neck supple                         Poor Dentition 4. SKIN: normal  Skin turgor,  Skin clean Dry and intact no rash 5. Heart: Regular rate and rhythm no  Murmur, no Rub or gallop 6. Lungs:  persistent wheezes   7. Abdomen: Soft,  non-tender, Non distended   obese  bowel sounds present 8. Lower extremities: no clubbing, cyanosis, no  edema 9. Neurologically Grossly intact, moving all 4 extremities equally   10. MSK: Normal range of motion    Chart has been reviewed  ______________________________________________________________________________________________  Assessment/Plan 51 y.o. female with medical history significant of asthma, COPD, or A-fib on Eliquis, CHF, GERD, obesity,   Admitted for Asthma exacerbation  Present on Admission:  Asthma exacerbation  Severe persistent asthma with acute exacerbation in adult  HTN (hypertension)  AF (paroxysmal atrial fibrillation) (HCC)  Chronic heart failure with preserved ejection fraction (HFpEF) (HCC)  Acute respiratory failure (HCC)  GERD (gastroesophageal reflux  disease)  Hyperlipidemia  Hypokalemia  Hypothyroidism     Severe persistent asthma with acute exacerbation in adult  -  - Will initiate: Steroid taper     - Albuterol  PRN, - scheduled duoneb,  -  Breo or Dulera at discharge   -  Mucinex.  Titrate O2 to saturation >90%. Follow patients respiratory status.     ABG showing no hypercarbia, Although Co2 normalizing is noted, cont to monitor closely -   Currently mentating well no evidence of symptomatic hypercarbia PCCM to see in AM  HTN (hypertension) Continue Norvasc 10 mg a day and Catapres 0.2 mg twice a day hold continue Lasix 40 mg daily hold off Coreg given wheezing and asthma exacerbation  AF (paroxysmal atrial fibrillation) ([place]) Continue Eliquis for now monitor for any sign of bleeding.  Hold off on Coreg for tonight given wheezing if needed may switch to Zebeta  Chronic heart failure with preserved ejection fraction (HFpEF) (HCC) Continue Lasix 40 mg a day avoid aggressive fluid resuscitation.  Current appears to be euvolemic  Acute respiratory failure (HCC)  this patient has acute respiratory failure with Hypoxia and   as documented by the presence of following: O2 saturatio< 90% on RA Likely due to: Asthma exacerbation,  Provide O2 therapy and titrate as needed  Continuous pulse ox  check Pulse ox with ambulation prior to discharge   may need  TC consult for home O2 set up  flutter valve ordered   Body mass index 50.0-59.9, adult ([place]) Morbid obesity.  Will need to follow-up as an outpatient nutrition  GERD (gastroesophageal reflux disease) Chronic stable continue Prilosec 40 mg daily  Hyperlipidemia Chronic stable continue Lipitor 20 mg daily  Hypokalemia Administer K. Dur 40 mg p.o. once and recheck in a.m.  Hypothyroidism Continue Synthroid 50 mcg p.o. daily check TSH   Other plan as per orders.  DVT prophylaxis:  eliquis    Code Status:    Code Status: Prior FULL CODE   as per patient    I had  personally discussed CODE STATUS with patient     Family Communication:   Family not at  Bedside    Disposition Plan:                                 To home once workup is complete and patient is stable   Following barriers for discharge:                            Electrolytes corrected                                                           Will likely need home health, home O2, set up                           Will need consultants to evaluate patient prior to discharge                       Would benefit from PT/OT eval prior to [place] called:    pulmonology is aware to see in consult in AM  Admission status:  ED Disposition     ED Disposition  [place]: [place] [[id number]]  Level of Care: Stepdown [14]  Admit to SDU based on following criteria: Respiratory Distress:  Frequent assessment and/or intervention to maintain adequate ventilation/respiration, pulmonary toilet, and respiratory treatment.  May admit patient to [facility] or [facility] if equivalent level of care is available:: No  Covid Evaluation: Symptomatic Person Under Investigation (PUI) or recent exposure (last 10 days) *Testing Required*  Diagnosis: Asthma exacerbation [[id number]]  Admitting Physician: Toy Baker [[id number]]  Attending Physician: Toy Baker [[id number]]  Estimated length of stay: past midnight tomorrow  Certification:: I certify this patient will need inpatient services for at least 2 midnights             inpatient     I Expect 2 midnight stay secondary to severity of patient's current illness need for inpatient interventions justified by the following:  hemodynamic instability despite optimal treatment (  tachycardia hypoxia )  Severe lab/radiological/exam abnormalities including:   Asthma exacerbation and extensive comorbidities including:   CHF  COPD/asthma   Morbid Obesity  Chronic anticoagulation  That are currently affecting medical management.   I expect  patient to be hospitalized for 2 midnights requiring inpatient medical care.  Patient is at high risk for adverse outcome (such as loss of life or disability) if not treated.  Indication for inpatient stay as follows:    Hemodynamic instability despite maximal medical therapy,    New or worsening hypoxia   Need for continuous nebulizer treatments IV steroids   Level of care   stepdwon tele indefinitely please discontinue once patient no longer qualifies COVID-19 Labs     Vandella Ord 10/08/2021, 11:26 PM    [facility]     after 2 AM please page floor coverage PA If 7AM-7PM, please contact the day team taking care of the patient using [email or web address]   Patient was evaluated in the context of the global COVID-19 pandemic, which necessitated consideration that the patient might be at risk for infection with the SARS-CoV-2 virus that causes COVID-19. Institutional protocols and algorithms that pertain to the evaluation of patients at risk for COVID-19 are in a state of rapid change based on information released by regulatory bodies including the CDC and federal and state organizations. These policies and algorithms were followed during the patient's care.

## 2021-10-08 NOTE — ED Notes (Signed)
Patient transported to X-ray 

## 2021-10-08 NOTE — Assessment & Plan Note (Signed)
Continue Synthroid 50 mcg p.o. daily check TSH

## 2021-10-08 NOTE — Assessment & Plan Note (Addendum)
Continue Norvasc 10 mg a day and Catapres 0.2 mg twice a day hold continue Lasix 40 mg daily hold off Coreg given wheezing and asthma exacerbation

## 2021-10-08 NOTE — ED Notes (Signed)
RT aware of orders for continuous neb tx.

## 2021-10-08 NOTE — Assessment & Plan Note (Signed)
Chronic stable continue Prilosec 40 mg daily

## 2021-10-09 DIAGNOSIS — J4541 Moderate persistent asthma with (acute) exacerbation: Secondary | ICD-10-CM

## 2021-10-09 DIAGNOSIS — I5032 Chronic diastolic (congestive) heart failure: Secondary | ICD-10-CM | POA: Diagnosis not present

## 2021-10-09 DIAGNOSIS — E7800 Pure hypercholesterolemia, unspecified: Secondary | ICD-10-CM

## 2021-10-09 DIAGNOSIS — Z6841 Body Mass Index (BMI) 40.0 and over, adult: Secondary | ICD-10-CM | POA: Diagnosis not present

## 2021-10-09 DIAGNOSIS — E038 Other specified hypothyroidism: Secondary | ICD-10-CM

## 2021-10-09 DIAGNOSIS — I480 Paroxysmal atrial fibrillation: Secondary | ICD-10-CM | POA: Diagnosis not present

## 2021-10-09 LAB — RESPIRATORY PANEL BY PCR

## 2021-10-09 LAB — CBC
HCT: 38.1 % (ref 36.0–46.0)
Hemoglobin: 11.5 g/dL — ABNORMAL LOW (ref 12.0–15.0)
MCH: 25.3 pg — ABNORMAL LOW (ref 26.0–34.0)
MCHC: 30.2 g/dL (ref 30.0–36.0)
MCV: 83.9 fL (ref 80.0–100.0)
Platelets: 410 10*3/uL — ABNORMAL HIGH (ref 150–400)
RBC: 4.54 MIL/uL (ref 3.87–5.11)
RDW: 19.6 % — ABNORMAL HIGH (ref 11.5–15.5)
WBC: 18.4 10*3/uL — ABNORMAL HIGH (ref 4.0–10.5)
nRBC: 0.3 % — ABNORMAL HIGH (ref 0.0–0.2)

## 2021-10-09 LAB — COMPREHENSIVE METABOLIC PANEL
ALT: 13 U/L (ref 0–44)
AST: 18 U/L (ref 15–41)
Albumin: 3.3 g/dL — ABNORMAL LOW (ref 3.5–5.0)
Alkaline Phosphatase: 64 U/L (ref 38–126)
Anion gap: 10 (ref 5–15)
BUN: 8 mg/dL (ref 6–20)
CO2: 20 mmol/L — ABNORMAL LOW (ref 22–32)
Calcium: 8.3 mg/dL — ABNORMAL LOW (ref 8.9–10.3)
Chloride: 106 mmol/L (ref 98–111)
Creatinine, Ser: 0.71 mg/dL (ref 0.44–1.00)
GFR, Estimated: >60 mL/min (ref >60)
Glucose, Bld: 257 mg/dL — ABNORMAL HIGH (ref 70–99)
Potassium: 3.5 mmol/L (ref 3.5–5.1)
Sodium: 136 mmol/L (ref 135–145)
Total Bilirubin: 0.2 mg/dL — ABNORMAL LOW (ref 0.3–1.2)
Total Protein: 7.1 g/dL (ref 6.5–8.1)

## 2021-10-09 LAB — MRSA NEXT GEN BY PCR, NASAL: MRSA by PCR Next Gen: NOT DETECTED

## 2021-10-09 LAB — TSH: TSH: 0.435 u[IU]/mL (ref 0.350–4.500)

## 2021-10-09 LAB — GLUCOSE, CAPILLARY: Glucose-Capillary: 184 mg/dL — ABNORMAL HIGH (ref 70–99)

## 2021-10-09 MED ORDER — ACETAMINOPHEN 650 MG RE SUPP: 650.0000 mg | Freq: Four times a day (QID) | RECTAL | Status: DC | PRN

## 2021-10-09 MED ORDER — SODIUM CHLORIDE 0.9% FLUSH: 3.0000 mL | INTRAVENOUS | Status: DC | PRN

## 2021-10-09 MED ORDER — DULOXETINE HCL 30 MG PO CPEP
60.0000 mg | ORAL_CAPSULE | Freq: Every day | ORAL | Status: DC
  Administered 2021-10-09 – 2021-10-18 (×10): 60 mg via ORAL
  Filled 2021-10-09 (×10): qty 2

## 2021-10-09 MED ORDER — IPRATROPIUM-ALBUTEROL 0.5-2.5 (3) MG/3ML IN SOLN
3.0000 mL | Freq: Four times a day (QID) | RESPIRATORY_TRACT | Status: DC
Start: 2021-10-09 — End: 2021-10-09
  Administered 2021-10-09: 3 mL via RESPIRATORY_TRACT
  Filled 2021-10-09: qty 3

## 2021-10-09 MED ORDER — CHLORHEXIDINE GLUCONATE CLOTH 2 % EX PADS
6.0000 | MEDICATED_PAD | Freq: Every day | CUTANEOUS | Status: DC
  Administered 2021-10-09 – 2021-10-16 (×8): 6 via TOPICAL

## 2021-10-09 MED ORDER — ACETAMINOPHEN 325 MG PO TABS
650.0000 mg | ORAL_TABLET | Freq: Four times a day (QID) | ORAL | Status: DC | PRN
  Administered 2021-10-10 – 2021-10-13 (×3): 650 mg via ORAL
  Filled 2021-10-09 (×3): qty 2

## 2021-10-09 MED ORDER — FUROSEMIDE 40 MG PO TABS
40.0000 mg | ORAL_TABLET | Freq: Every day | ORAL | Status: DC
  Administered 2021-10-09 – 2021-10-18 (×10): 40 mg via ORAL
  Filled 2021-10-09 (×10): qty 1

## 2021-10-09 MED ORDER — DM-GUAIFENESIN ER 30-600 MG PO TB12
1.0000 | ORAL_TABLET | Freq: Two times a day (BID) | ORAL | Status: DC
  Administered 2021-10-09 – 2021-10-18 (×19): 1 via ORAL
  Filled 2021-10-09 (×19): qty 1

## 2021-10-09 MED ORDER — FLUTICASONE FUROATE-VILANTEROL 100-25 MCG/ACT IN AEPB
1.0000 | INHALATION_SPRAY | Freq: Every day | RESPIRATORY_TRACT | Status: DC
  Administered 2021-10-09 – 2021-10-13 (×5): 1 via RESPIRATORY_TRACT
  Filled 2021-10-09: qty 28

## 2021-10-09 MED ORDER — MONTELUKAST SODIUM 10 MG PO TABS
10.0000 mg | ORAL_TABLET | Freq: Every day | ORAL | Status: DC
  Administered 2021-10-09 – 2021-10-17 (×10): 10 mg via ORAL
  Filled 2021-10-09 (×10): qty 1

## 2021-10-09 MED ORDER — ALUM & MAG HYDROXIDE-SIMETH 200-200-20 MG/5ML PO SUSP
30.0000 mL | ORAL | Status: DC | PRN
  Administered 2021-10-17 – 2021-10-18 (×2): 30 mL via ORAL
  Filled 2021-10-09 (×2): qty 30

## 2021-10-09 MED ORDER — ORAL CARE MOUTH RINSE
15.0000 mL | Freq: Two times a day (BID) | OROMUCOSAL | Status: DC
  Administered 2021-10-09 – 2021-10-18 (×17): 15 mL via OROMUCOSAL

## 2021-10-09 MED ORDER — CARVEDILOL 6.25 MG PO TABS: 6.2500 mg | ORAL_TABLET | Freq: Two times a day (BID) | ORAL | Status: DC

## 2021-10-09 MED ORDER — PANTOPRAZOLE SODIUM 40 MG PO TBEC
40.0000 mg | DELAYED_RELEASE_TABLET | Freq: Every day | ORAL | Status: DC
  Administered 2021-10-09 – 2021-10-10 (×2): 40 mg via ORAL
  Filled 2021-10-09 (×2): qty 1

## 2021-10-09 MED ORDER — APIXABAN 5 MG PO TABS
5.0000 mg | ORAL_TABLET | Freq: Two times a day (BID) | ORAL | Status: DC
  Administered 2021-10-09 – 2021-10-18 (×20): 5 mg via ORAL
  Filled 2021-10-09 (×20): qty 1

## 2021-10-09 MED ORDER — K PHOS MONO-SOD PHOS DI & MONO 155-852-130 MG PO TABS
250.0000 mg | ORAL_TABLET | Freq: Two times a day (BID) | ORAL | Status: DC
  Administered 2021-10-09 – 2021-10-18 (×20): 250 mg via ORAL
  Filled 2021-10-09 (×21): qty 1

## 2021-10-09 MED ORDER — HYDROCODONE-ACETAMINOPHEN 5-325 MG PO TABS
1.0000 | ORAL_TABLET | ORAL | Status: DC | PRN
  Administered 2021-10-09 (×2): 2 via ORAL
  Administered 2021-10-09: 1 via ORAL
  Administered 2021-10-09 – 2021-10-11 (×7): 2 via ORAL
  Administered 2021-10-12: 1 via ORAL
  Administered 2021-10-12 – 2021-10-17 (×14): 2 via ORAL
  Administered 2021-10-17 – 2021-10-18 (×3): 1 via ORAL
  Filled 2021-10-09 (×4): qty 2
  Filled 2021-10-09 (×2): qty 1
  Filled 2021-10-09 (×2): qty 2
  Filled 2021-10-09: qty 1
  Filled 2021-10-09 (×17): qty 2
  Filled 2021-10-09: qty 1
  Filled 2021-10-09: qty 2
  Filled 2021-10-09: qty 1

## 2021-10-09 MED ORDER — HYDRALAZINE HCL 20 MG/ML IJ SOLN
10.0000 mg | INTRAMUSCULAR | Status: DC | PRN
  Administered 2021-10-09 – 2021-10-13 (×4): 10 mg via INTRAVENOUS
  Filled 2021-10-09 (×4): qty 1

## 2021-10-09 MED ORDER — OXYCODONE-ACETAMINOPHEN 5-325 MG PO TABS
1.0000 | ORAL_TABLET | Freq: Four times a day (QID) | ORAL | Status: DC | PRN
  Administered 2021-10-09: 1 via ORAL
  Filled 2021-10-09: qty 1

## 2021-10-09 MED ORDER — UMECLIDINIUM BROMIDE 62.5 MCG/ACT IN AEPB
1.0000 | INHALATION_SPRAY | Freq: Every day | RESPIRATORY_TRACT | Status: DC
  Administered 2021-10-09 – 2021-10-13 (×5): 1 via RESPIRATORY_TRACT
  Filled 2021-10-09: qty 7

## 2021-10-09 MED ORDER — CLONIDINE HCL 0.1 MG PO TABS
0.2000 mg | ORAL_TABLET | Freq: Two times a day (BID) | ORAL | Status: DC
  Administered 2021-10-09 – 2021-10-18 (×20): 0.2 mg via ORAL
  Filled 2021-10-09 (×20): qty 2

## 2021-10-09 MED ORDER — SODIUM CHLORIDE 0.9 % IV SOLN
250.0000 mL | INTRAVENOUS | Status: DC | PRN
  Administered 2021-10-10: 250 mL via INTRAVENOUS

## 2021-10-09 MED ORDER — OXYCODONE-ACETAMINOPHEN 10-325 MG PO TABS: 1.0000 | ORAL_TABLET | Freq: Four times a day (QID) | ORAL | Status: DC | PRN

## 2021-10-09 MED ORDER — PREGABALIN 75 MG PO CAPS
300.0000 mg | ORAL_CAPSULE | Freq: Two times a day (BID) | ORAL | Status: DC
  Administered 2021-10-09 – 2021-10-18 (×20): 300 mg via ORAL
  Filled 2021-10-09: qty 3
  Filled 2021-10-09 (×5): qty 4
  Filled 2021-10-09: qty 3
  Filled 2021-10-09 (×4): qty 4
  Filled 2021-10-09: qty 3
  Filled 2021-10-09 (×2): qty 4
  Filled 2021-10-09: qty 6
  Filled 2021-10-09 (×5): qty 4

## 2021-10-09 MED ORDER — CARVEDILOL 6.25 MG PO TABS
6.2500 mg | ORAL_TABLET | Freq: Two times a day (BID) | ORAL | Status: DC
  Administered 2021-10-09 – 2021-10-14 (×10): 6.25 mg via ORAL
  Filled 2021-10-09 (×10): qty 1

## 2021-10-09 MED ORDER — LEVOTHYROXINE SODIUM 50 MCG PO TABS
50.0000 ug | ORAL_TABLET | Freq: Every day | ORAL | Status: DC
  Administered 2021-10-09 – 2021-10-18 (×10): 50 ug via ORAL
  Filled 2021-10-09 (×10): qty 1

## 2021-10-09 MED ORDER — ATORVASTATIN CALCIUM 10 MG PO TABS
20.0000 mg | ORAL_TABLET | Freq: Every morning | ORAL | Status: DC
  Administered 2021-10-09 – 2021-10-18 (×10): 20 mg via ORAL
  Filled 2021-10-09 (×10): qty 2

## 2021-10-09 MED ORDER — IPRATROPIUM-ALBUTEROL 0.5-2.5 (3) MG/3ML IN SOLN
3.0000 mL | Freq: Four times a day (QID) | RESPIRATORY_TRACT | Status: DC
  Administered 2021-10-09 (×3): 3 mL via RESPIRATORY_TRACT
  Filled 2021-10-09 (×3): qty 3

## 2021-10-09 MED ORDER — IPRATROPIUM-ALBUTEROL 0.5-2.5 (3) MG/3ML IN SOLN: 3.0000 mL | Freq: Four times a day (QID) | RESPIRATORY_TRACT | Status: DC

## 2021-10-09 MED ORDER — METHYLPREDNISOLONE SODIUM SUCC 125 MG IJ SOLR
60.0000 mg | Freq: Two times a day (BID) | INTRAMUSCULAR | Status: AC
  Administered 2021-10-09 – 2021-10-17 (×18): 60 mg via INTRAVENOUS
  Filled 2021-10-09 (×18): qty 2

## 2021-10-09 MED ORDER — PREDNISONE 20 MG PO TABS: 40.0000 mg | ORAL_TABLET | Freq: Every day | ORAL | Status: DC

## 2021-10-09 MED ORDER — OXYCODONE HCL 5 MG PO TABS: 5.0000 mg | ORAL_TABLET | Freq: Four times a day (QID) | ORAL | Status: DC | PRN

## 2021-10-09 MED ORDER — AMLODIPINE BESYLATE 10 MG PO TABS
10.0000 mg | ORAL_TABLET | Freq: Every day | ORAL | Status: DC
  Administered 2021-10-09 – 2021-10-18 (×10): 10 mg via ORAL
  Filled 2021-10-09 (×10): qty 1

## 2021-10-09 MED ORDER — CLONAZEPAM 0.5 MG PO TABS
0.5000 mg | ORAL_TABLET | Freq: Two times a day (BID) | ORAL | Status: DC | PRN
  Administered 2021-10-09 – 2021-10-17 (×16): 0.5 mg via ORAL
  Filled 2021-10-09 (×17): qty 1

## 2021-10-09 MED ORDER — METHYLPREDNISOLONE SODIUM SUCC 40 MG IJ SOLR
40.0000 mg | Freq: Four times a day (QID) | INTRAMUSCULAR | Status: DC
  Administered 2021-10-09: 40 mg via INTRAVENOUS
  Filled 2021-10-09: qty 1

## 2021-10-09 MED ORDER — SODIUM CHLORIDE 0.9% FLUSH
3.0000 mL | Freq: Two times a day (BID) | INTRAVENOUS | Status: DC
  Administered 2021-10-09 – 2021-10-17 (×16): 3 mL via INTRAVENOUS

## 2021-10-09 NOTE — Progress Notes (Signed)
  Transition of Care [facility]) Screening Note   Patient Details  Name: April [name] Date of Birth: [birth year]   Transition of Care [facility]) CM/SW Contact:    Dessa Phi, RN Phone Number: 10/09/2021, 10:47 AM    Transition of Care Department [facility]) has reviewed patient and no TOC needs have been identified at this time. We will continue to monitor patient advancement through interdisciplinary progression rounds. If new patient transition needs arise, please place a TOC consult.

## 2021-10-09 NOTE — Progress Notes (Signed)
PROGRESS NOTE    April [name]  GYI:[id number] DOB: [birth year] DOA: 10/08/2021 PCP: April Rakes, MD     Brief Narrative:  51 y.o. BF PMHx Asthma, COPD, or A-fib on Eliquis, CHF, GERD, obesity, DM type II uncontrolled with hyperglycemia, hypothyroidism     Presented with chest pain shortness of breath asthma exacerbation Patient with known history of severe asthma COPD complaining of shortness of breath and chest pain.  Happening for the past few days feels like asthma exacerbation.  She has been wheezing and using her inhalers and nebulizer but does not seem to help.  She went to her primary care provider and gotten an injection for her asthma.  Her breathing is worse with exertion Chest pain nonradiating. No associated fevers or chills She has been taking recent steroids for asthma exacerbation finished on Friday 3 days ago   Does not smoke no drinks last intubation for asthma was years ago  She uses 4 L of O2 at night does not use CPAP   Subjective: A/O x4 increased SOB   Assessment & Plan: Covid vaccination;   Active Problems:   Severe persistent asthma with acute exacerbation in adult   HTN (hypertension)   AF (paroxysmal atrial fibrillation) (HCC)   Acute respiratory failure (HCC)   Chronic heart failure with preserved ejection fraction (HFpEF) (HCC)   Hypothyroidism   Hypokalemia   Hyperlipidemia   GERD (gastroesophageal reflux disease)   Asthma exacerbation   Body mass index 50.0-59.9, adult (HCC)  Severe persistent asthma with acute exacerbation in adult -DuoNeb QID - Solu-Medrol 60 mg BID -Singulair 10 mg daily - Breo Ellipta 100-25 1 puff daily - Incruse Ellipta 62.5 mcg 1 puff daily - Guaifenesin DM BID - Incentive spirometry - Flutter valve  -Continuous O2 monitoring - Titrate O2 to maintain SPO2 > 90% - PCCM consulted  Acute on chronic respiratory failure with hypoxia  -At home uses 4 L O2 at night - Does not use CPAP -See asthma    Essential  HTN/HTN urgency -Amlodipine 10 mg daily - Catapres 0.2 mg BID - Coreg 6.25 mg  BID  -Hydralazine PRN -Lasix 40 mg daily   AF (paroxysmal atrial fibrillation) (HCC) -See essential HTN - Eliquis 5 mg BID   Chronic heart failure with preserved ejection fraction (HFpEF) (HCC) -See essential HTN - Strict in and out - Daily weight   GERD (gastroesophageal reflux disease) -Protonix 40 mg daily (MAR) - Maalox PRN   Hyperlipidemia -Lipitor 20 mg daily   Hypokalemia -Potassium goal > 4    Hypothyroidism -TSH pending - Synthroid 50 mcg daily  Obesity52.84 kg/m      Mobility Assessment (last 72 hours)     Mobility Assessment   No documentation.             Interdisciplinary Goals of Care Family Meeting   Date carried out: 10/09/2021  Location of the meeting:   Member's involved:   Durable Power of Tour manager:     Discussion: We discussed goals of care for April [name] .    Code status:   Disposition:   Time spent for the meeting:     April [name] J, MD  10/09/2021, 7:53 AM         DVT prophylaxis: Eliquis Code Status: Full Family Communication:  Status is: Inpatient    Dispo: The patient is from: Home              Anticipated d/c is  to: Home              Anticipated d/c date is: > 3 days              Patient currently is not medically stable to d/c.      Consultants:    Procedures/Significant Events:    I have personally reviewed and interpreted all radiology studies and my findings are as above.  VENTILATOR SETTINGS: Nasal cannula 6/13 Flow 4 L/min SPO2 100%   Cultures   Antimicrobials:    Devices    LINES / TUBES:      Continuous Infusions:  sodium chloride       Objective: Vitals:   10/09/21 0505 10/09/21 0506 10/09/21 0600 10/09/21 0746  BP:  (!) 136/104 (!) 143/74   Pulse:  91 92   Resp:  (!) 27 20   Temp:    98.3 F (36.8 C)  TempSrc:    Oral  SpO2:  100%  100%   Weight: (!) 148.5 kg     Height:  '5\' 6"'$  (1.676 m)      Intake/Output Summary (Last 24 hours) at 10/09/2021 0754 Last data filed at 10/09/2021 0746 Gross per 24 hour  Intake 530 ml  Output --  Net 530 ml   Filed Weights   10/09/21 0505  Weight: (!) 148.5 kg    Examination:  General: A/O x4, positive acute on chronic respiratory distress Eyes: negative scleral hemorrhage, negative anisocoria, negative icterus ENT: Negative Runny nose, negative gingival bleeding, Neck:  Negative scars, masses, torticollis, lymphadenopathy, JVD Lungs: tachypneic decreased breath sounds bilateral, positive expiratory wheezes, negative crackles Cardiovascular: Regular rate and rhythm without murmur gallop or rub normal S1 and S2 Abdomen: MORBIDLY OBESE, negative abdominal pain, nondistended, positive soft, bowel sounds, no rebound, no ascites, no appreciable mass Extremities: No significant cyanosis, clubbing, or edema bilateral lower extremities Skin: Negative rashes, lesions, ulcers Psychiatric:  Negative depression, negative anxiety, negative fatigue, negative mania  Central nervous system:  Cranial nerves II through XII intact, tongue/uvula midline, all extremities muscle strength 5/5, sensation intact throughout, negative dysarthria, negative expressive aphasia, negative receptive aphasia.  .     Data Reviewed: Care during the described time interval was provided by me .  I have reviewed this patient's available data, including medical history, events of note, physical examination, and all test results as part of my evaluation.  CBC: Recent Labs  Lab 10/08/21 1802 10/09/21 0304  WBC 18.2* 18.4*  HGB 11.7* 11.5*  HCT 38.3 38.1  MCV 82.9 83.9  PLT 426* 497*   Basic Metabolic Panel: Recent Labs  Lab 10/08/21 1802 10/08/21 2009 10/09/21 0304  NA 143  --  136  K 3.4*  --  3.5  CL 109  --  106  CO2 27  --  20*  GLUCOSE 106*  --  257*  BUN 9  --  8  CREATININE 0.81  --  0.71   CALCIUM 8.6*  --  8.3*  PHOS  --  2.3*  --    GFR: Estimated Creatinine Clearance: 129 mL/min (by C-G formula based on SCr of 0.71 mg/dL). Liver Function Tests: Recent Labs  Lab 10/08/21 2009 10/09/21 0304  AST 13* 18  ALT 12 13  ALKPHOS 63 64  BILITOT 0.2* 0.2*  PROT 6.8 7.1  ALBUMIN 3.1* 3.3*   No results for input(s): "LIPASE", "AMYLASE" in the last 168 hours. No results for input(s): "AMMONIA" in the last 168 hours. Coagulation Profile: No results  for input(s): "INR", "PROTIME" in the last 168 hours. Cardiac Enzymes: Recent Labs  Lab 10/08/21 2009  CKTOTAL 108   BNP (last 3 results) No results for input(s): "PROBNP" in the last 8760 hours. HbA1C: No results for input(s): "HGBA1C" in the last 72 hours. CBG: No results for input(s): "GLUCAP" in the last 168 hours. Lipid Profile: No results for input(s): "CHOL", "HDL", "LDLCALC", "TRIG", "CHOLHDL", "LDLDIRECT" in the last 72 hours. Thyroid Function Tests: Recent Labs    10/09/21 0304  TSH 0.435   Anemia Panel: No results for input(s): "VITAMINB12", "FOLATE", "FERRITIN", "TIBC", "IRON", "RETICCTPCT" in the last 72 hours. Sepsis Labs: No results for input(s): "PROCALCITON", "LATICACIDVEN" in the last 168 hours.  Recent Results (from the past 240 hour(s))  SARS Coronavirus 2 by RT PCR (hospital order, performed in [facility] hospital lab) *cepheid single result test* Anterior Nasal Swab     Status: None   Collection Time: 10/08/21 10:22 PM   Specimen: Anterior Nasal Swab  Result Value Ref Range Status   SARS Coronavirus 2 by RT PCR NEGATIVE NEGATIVE Final    Comment: (NOTE) SARS-CoV-2 target nucleic acids are NOT DETECTED.  The SARS-CoV-2 RNA is generally detectable in upper and lower respiratory specimens during the acute phase of infection. The lowest concentration of SARS-CoV-2 viral copies this assay can detect is 250 copies / mL. A negative result does not preclude SARS-CoV-2 infection and should not  be used as the sole basis for treatment or other patient management decisions.  A negative result may occur with improper specimen collection / handling, submission of specimen other than nasopharyngeal swab, presence of viral mutation(s) within the areas targeted by this assay, and inadequate number of viral copies (<250 copies / mL). A negative result must be combined with clinical observations, patient history, and epidemiological information.  Fact Sheet for Patients:   https://www.patel.info/  Fact Sheet for Healthcare Providers: https://hall.com/  This test is not yet approved or  cleared by the [place] FDA and has been authorized for detection and/or diagnosis of SARS-CoV-2 by FDA under an Emergency Use Authorization (EUA).  This EUA will remain in effect (meaning this test can be used) for the duration of the COVID-19 declaration under Section 564(b)(1) of the Act, 21 U.S.C. section 360bbb-3(b)(1), unless the authorization is terminated or revoked sooner.  Performed at [facility], [place] [address] [place], [place] [address]   MRSA Next Gen by PCR, Nasal     Status: None   Collection Time: 10/09/21  3:02 AM   Specimen: Nasal Mucosa; Nasal Swab  Result Value Ref Range Status   MRSA by PCR Next Gen NOT DETECTED NOT DETECTED Final    Comment: (NOTE) The GeneXpert MRSA Assay (FDA approved for NASAL specimens only), is one component of a comprehensive MRSA colonization surveillance program. It is not intended to diagnose MRSA infection nor to guide or monitor treatment for MRSA infections. Test performance is not FDA approved in patients less than 64 years old. Performed at [facility], [place] [address], [place], [place] [address]          Radiology Studies: DG Chest 2 View  Result Date: 10/08/2021 CLINICAL DATA:  Shortness of breath, chest pain EXAM: CHEST - 2 VIEW COMPARISON:   08/26/2021 FINDINGS: The heart size and mediastinal contours are within normal limits. Both lungs are clear. The visualized skeletal structures are unremarkable. IMPRESSION: No active cardiopulmonary disease. Electronically Signed   By: Rolm Baptise M.D.  On: 10/08/2021 19:14        Scheduled Meds:  amLODipine  10 mg Oral Daily   apixaban  5 mg Oral BID   atorvastatin  20 mg Oral q morning   Chlorhexidine Gluconate Cloth  6 each Topical Daily   cloNIDine  0.2 mg Oral BID   fluticasone furoate-vilanterol  1 puff Inhalation Daily   And   umeclidinium bromide  1 puff Inhalation Daily   furosemide  40 mg Oral Daily   ipratropium-albuterol  3 mL Nebulization QID   levothyroxine  50 mcg Oral Daily   mouth rinse  15 mL Mouth Rinse BID   methylPREDNISolone (SOLU-MEDROL) injection  40 mg Intravenous Q6H   Followed by   Derrill Memo ON 10/10/2021] predniSONE  40 mg Oral Q breakfast   montelukast  10 mg Oral QHS   pantoprazole  40 mg Oral Daily   phosphorus  250 mg Oral BID   pregabalin  300 mg Oral BID   sodium chloride flush  3 mL Intravenous Q12H   Continuous Infusions:  sodium chloride       LOS: 1 day    Time spent:40 min    Latania Bascomb, Geraldo Docker, MD [facility]   If 7PM-7AM, please contact night-coverage 10/09/2021, 7:54 AM

## 2021-10-09 NOTE — Plan of Care (Signed)
Discussed with patient plan of care for the shift, pain management and admission questions with some teach back displayed.  Problem: Education: Goal: Knowledge of General Education information will improve Description: Including pain rating scale, medication(s)/side effects and non-pharmacologic comfort measures Outcome: Progressing   Problem: Health Behavior/Discharge Planning: Goal: Ability to manage health-related needs will improve Outcome: Progressing   

## 2021-10-10 DIAGNOSIS — J4551 Severe persistent asthma with (acute) exacerbation: Secondary | ICD-10-CM | POA: Diagnosis not present

## 2021-10-10 LAB — CBC WITH DIFFERENTIAL/PLATELET
Abs Immature Granulocytes: 0.38 10*3/uL — ABNORMAL HIGH (ref 0.00–0.07)
Basophils Absolute: 0.1 10*3/uL (ref 0.0–0.1)
Basophils Relative: 0 %
Eosinophils Absolute: 0 10*3/uL (ref 0.0–0.5)
Eosinophils Relative: 0 %
HCT: 38.1 % (ref 36.0–46.0)
Hemoglobin: 11.4 g/dL — ABNORMAL LOW (ref 12.0–15.0)
Immature Granulocytes: 2 %
Lymphocytes Relative: 8 %
Lymphs Abs: 2.1 10*3/uL (ref 0.7–4.0)
MCH: 25 pg — ABNORMAL LOW (ref 26.0–34.0)
MCHC: 29.9 g/dL — ABNORMAL LOW (ref 30.0–36.0)
MCV: 83.6 fL (ref 80.0–100.0)
Monocytes Absolute: 1.4 10*3/uL — ABNORMAL HIGH (ref 0.1–1.0)
Monocytes Relative: 5 %
Neutro Abs: 22.2 10*3/uL — ABNORMAL HIGH (ref 1.7–7.7)
Neutrophils Relative %: 85 %
Platelets: 418 10*3/uL — ABNORMAL HIGH (ref 150–400)
RBC: 4.56 MIL/uL (ref 3.87–5.11)
RDW: 19.3 % — ABNORMAL HIGH (ref 11.5–15.5)
WBC: 26.1 10*3/uL — ABNORMAL HIGH (ref 4.0–10.5)
nRBC: 0 % (ref 0.0–0.2)

## 2021-10-10 LAB — COMPREHENSIVE METABOLIC PANEL
ALT: 13 U/L (ref 0–44)
AST: 12 U/L — ABNORMAL LOW (ref 15–41)
Albumin: 3.1 g/dL — ABNORMAL LOW (ref 3.5–5.0)
Alkaline Phosphatase: 66 U/L (ref 38–126)
Anion gap: 7 (ref 5–15)
BUN: 14 mg/dL (ref 6–20)
CO2: 25 mmol/L (ref 22–32)
Calcium: 8.7 mg/dL — ABNORMAL LOW (ref 8.9–10.3)
Chloride: 106 mmol/L (ref 98–111)
Creatinine, Ser: 0.65 mg/dL (ref 0.44–1.00)
GFR, Estimated: >60 mL/min (ref >60)
Glucose, Bld: 146 mg/dL — ABNORMAL HIGH (ref 70–99)
Potassium: 4.2 mmol/L (ref 3.5–5.1)
Sodium: 138 mmol/L (ref 135–145)
Total Bilirubin: 0.5 mg/dL (ref 0.3–1.2)
Total Protein: 6.7 g/dL (ref 6.5–8.1)

## 2021-10-10 LAB — GLUCOSE, CAPILLARY
Glucose-Capillary: 146 mg/dL — ABNORMAL HIGH (ref 70–99)
Glucose-Capillary: 143 mg/dL — ABNORMAL HIGH (ref 70–99)
Glucose-Capillary: 160 mg/dL — ABNORMAL HIGH (ref 70–99)
Glucose-Capillary: 187 mg/dL — ABNORMAL HIGH (ref 70–99)

## 2021-10-10 LAB — MAGNESIUM: Magnesium: 2.1 mg/dL (ref 1.7–2.4)

## 2021-10-10 LAB — PHOSPHORUS: Phosphorus: 2.1 mg/dL — ABNORMAL LOW (ref 2.5–4.6)

## 2021-10-10 MED ORDER — PANTOPRAZOLE SODIUM 40 MG PO TBEC
40.0000 mg | DELAYED_RELEASE_TABLET | Freq: Two times a day (BID) | ORAL | Status: DC
  Administered 2021-10-10 – 2021-10-18 (×16): 40 mg via ORAL
  Filled 2021-10-10 (×16): qty 1

## 2021-10-10 MED ORDER — ZOLPIDEM TARTRATE 5 MG PO TABS
5.0000 mg | ORAL_TABLET | Freq: Every evening | ORAL | Status: DC | PRN
Start: 2021-10-10 — End: 2021-10-14
  Administered 2021-10-10 – 2021-10-13 (×4): 5 mg via ORAL
  Filled 2021-10-10 (×4): qty 1

## 2021-10-10 MED ORDER — ALBUTEROL SULFATE (2.5 MG/3ML) 0.083% IN NEBU
2.5000 mg | INHALATION_SOLUTION | Freq: Four times a day (QID) | RESPIRATORY_TRACT | Status: DC
  Administered 2021-10-10 – 2021-10-11 (×6): 2.5 mg via RESPIRATORY_TRACT
  Filled 2021-10-10 (×6): qty 3

## 2021-10-10 MED ORDER — DEXTROSE 5 % IV SOLN
15.0000 mmol | Freq: Once | INTRAVENOUS | Status: AC
  Administered 2021-10-10: 15 mmol via INTRAVENOUS
  Filled 2021-10-10: qty 5

## 2021-10-10 NOTE — Progress Notes (Signed)
Pt's IV occluded after 5 hours. Informed pt that she will need a KVO to keep the next IV from occluding. Pt refused and stated she will just have to be stuck for a new IV if it occludes again. Pt educated.   Attempted IV but unable to place. Pt refuses a second attempt, stating this RN should call IV team and she will just wait for them to place one. Informed and educated pt that her BP is high and she needs hydralazine. Pt still refusing. IV consult is in. Olena Heckle, NP notified of both refusals.   Pt showed me a cut on her hand that she states is hurting and needs a bandaid. This RN cleaned the cut, covered it in foam and wrapped kerlex around it to keep it in place. Pt states this cut is from home. Pt initially stated she cut her hand on glass. This RN asked if pt fell on glass. Pt said she did not. When this RN asked how pt cut it for a second time, pt then stated she cut herself while using a knife in the kitchen.

## 2021-10-10 NOTE — Progress Notes (Addendum)
PROGRESS NOTE    April [name]  FUX:[id number] DOB: Sep 24, 1970 DOA: 10/08/2021 PCP: [name], MD   Brief Narrative: 51 year old with past medical history significant for asthma, COPD, A-fib on Eliquis, CHF, GERD, obesity, diabetes type 2 hypothyroidism who presents complaining of chest pain, shortness of breath and asthma exacerbation.  She reports wheezing and using her inhalers without significant relief.  She recently completed a steroid taper on Friday.  3 days after she presented with worsening shortness of breath   Assessment & Plan:   Active Problems:   Severe persistent asthma with acute exacerbation in adult   HTN (hypertension)   AF (paroxysmal atrial fibrillation) (HCC)   Acute respiratory failure (HCC)   Chronic heart failure with preserved ejection fraction (HFpEF) (HCC)   Hypothyroidism   Hypokalemia   Hyperlipidemia   GERD (gastroesophageal reflux disease)   Asthma exacerbation   Body mass index 50.0-59.9, adult ([place])  1-Severe persistent Asthma with acute exacerbation: Continue with IV Solu-Medrol 40 mg twice daily Continue with Singulair Continue with Breo Ellipta and Incruse Continue with guaifenesin Continue with flutter valve Continue with the schedule albuterol PPI twice daily to help prevent reflux Discussed with patient bout benefit of weight loss/    2-Acute on Chronic Hypoxic Respiratory Failure Use 4 L of oxygen at home Currently on 4 L, wean as tolerated  3-Hypertension: Continue with amlodipine, Catapres, Coreg, Lasix and as needed hydralazine  4-paroxysmal A-fib: Continue with Eliquis and Coreg  5-Chronic diastolic heart failure: Continue with oral Lasix  GERD: Change PPI to twice daily  Hyperlipidemia continue Lipitor Hypothyroidism: Continue with Synthroid Leukocytosis; secondary to steroids. Afebrile.  Morbid obesity : Needs lifestyle modification Hypophosphatemia; replete IV>   Estimated body mass index is 52.84 kg/m as  calculated from the following:   Height as of this encounter: '5\' 6"'$  (1.676 m).   Weight as of this encounter: 148.5 kg.   DVT prophylaxis: Eliquis Code Status: full code Family Communication: Care discussed with patient.  Disposition Plan:  Status is: Inpatient Remains inpatient appropriate because: management of asthma exacerbation. Transfer to med surgery     Consultants:  None  Procedures:  None  Antimicrobials:    Subjective: She  is feeling better. She uses 4 L oxygen at bedtime and when she is sick.   Objective: Vitals:   10/10/21 0415 10/10/21 0500 10/10/21 0600 10/10/21 0620  BP:  (!) 151/97 (!) 193/114 (!) 193/114  Pulse:  90 85   Resp:  15 17   Temp: 98.5 F (36.9 C)     TempSrc: Oral     SpO2:  100% 93%   Weight:      Height:        Intake/Output Summary (Last 24 hours) at 10/10/2021 0716 Last data filed at 10/10/2021 0015 Gross per 24 hour  Intake 2290 ml  Output --  Net 2290 ml   Filed Weights   10/09/21 0505  Weight: (!) 148.5 kg    Examination:  General exam: Appears calm and comfortable  Respiratory system: BL expiratory wheezing  Cardiovascular system: S1 & S2 heard, RRR. No JVD, murmurs, rubs, gallops or clicks. No pedal edema. Gastrointestinal system: Abdomen is nondistended, soft and nontender. No organomegaly or masses felt. Normal bowel sounds heard. Central nervous system: Alert and oriented. No focal neurological deficits. Extremities: Symmetric 5 x 5 power.     Data Reviewed: I have personally reviewed following labs and imaging studies  CBC: Recent Labs  Lab 10/08/21 1802 10/09/21 0304  10/10/21 0319  WBC 18.2* 18.4* 26.1*  NEUTROABS  --   --  22.2*  HGB 11.7* 11.5* 11.4*  HCT 38.3 38.1 38.1  MCV 82.9 83.9 83.6  PLT 426* 410* 124*   Basic Metabolic Panel: Recent Labs  Lab 10/08/21 1802 10/08/21 2009 10/09/21 0304 10/10/21 0319  NA 143  --  136 138  K 3.4*  --  3.5 4.2  CL 109  --  106 106  CO2 27  --  20*  25  GLUCOSE 106*  --  257* 146*  BUN 9  --  8 14  CREATININE 0.81  --  0.71 0.65  CALCIUM 8.6*  --  8.3* 8.7*  MG  --   --   --  2.1  PHOS  --  2.3*  --  2.1*   GFR: Estimated Creatinine Clearance: 129 mL/min (by C-G formula based on SCr of 0.65 mg/dL). Liver Function Tests: Recent Labs  Lab 10/08/21 2009 10/09/21 0304 10/10/21 0319  AST 13* 18 12*  ALT '12 13 13  '$ ALKPHOS 63 64 66  BILITOT 0.2* 0.2* 0.5  PROT 6.8 7.1 6.7  ALBUMIN 3.1* 3.3* 3.1*   No results for input(s): "LIPASE", "AMYLASE" in the last 168 hours. No results for input(s): "AMMONIA" in the last 168 hours. Coagulation Profile: No results for input(s): "INR", "PROTIME" in the last 168 hours. Cardiac Enzymes: Recent Labs  Lab 10/08/21 2009  CKTOTAL 108   BNP (last 3 results) No results for input(s): "PROBNP" in the last 8760 hours. HbA1C: No results for input(s): "HGBA1C" in the last 72 hours. CBG: Recent Labs  Lab 10/09/21 2327  GLUCAP 184*   Lipid Profile: No results for input(s): "CHOL", "HDL", "LDLCALC", "TRIG", "CHOLHDL", "LDLDIRECT" in the last 72 hours. Thyroid Function Tests: Recent Labs    10/09/21 0304  TSH 0.435   Anemia Panel: No results for input(s): "VITAMINB12", "FOLATE", "FERRITIN", "TIBC", "IRON", "RETICCTPCT" in the last 72 hours. Sepsis Labs: No results for input(s): "PROCALCITON", "LATICACIDVEN" in the last 168 hours.  Recent Results (from the past 240 hour(s))  SARS Coronavirus 2 by RT PCR (hospital order, performed in [facility] hospital lab) *cepheid single result test* Anterior Nasal Swab     Status: None   Collection Time: 10/08/21 10:22 PM   Specimen: Anterior Nasal Swab  Result Value Ref Range Status   SARS Coronavirus 2 by RT PCR NEGATIVE NEGATIVE Final    Comment: (NOTE) SARS-CoV-2 target nucleic acids are NOT DETECTED.  The SARS-CoV-2 RNA is generally detectable in upper and lower respiratory specimens during the acute phase of infection. The  lowest concentration of SARS-CoV-2 viral copies this assay can detect is 250 copies / mL. A negative result does not preclude SARS-CoV-2 infection and should not be used as the sole basis for treatment or other patient management decisions.  A negative result may occur with improper specimen collection / handling, submission of specimen other than nasopharyngeal swab, presence of viral mutation(s) within the areas targeted by this assay, and inadequate number of viral copies (<250 copies / mL). A negative result must be combined with clinical observations, patient history, and epidemiological information.  Fact Sheet for Patients:   https://www.patel.info/  Fact Sheet for Healthcare Providers: https://hall.com/  This test is not yet approved or  cleared by the [place] FDA and has been authorized for detection and/or diagnosis of SARS-CoV-2 by FDA under an Emergency Use Authorization (EUA).  This EUA will remain in effect (meaning this test can be  used) for the duration of the COVID-19 declaration under Section 564(b)(1) of the Act, 21 U.S.C. section 360bbb-3(b)(1), unless the authorization is terminated or revoked sooner.  Performed at [facility], [place] [address] [place], [place] [address]   Respiratory (~20 pathogens) panel by PCR     Status: None   Collection Time: 10/09/21  3:02 AM   Specimen: Nasopharyngeal Swab; Respiratory  Result Value Ref Range Status   Adenovirus NOT DETECTED NOT DETECTED Final   Coronavirus 229E NOT DETECTED NOT DETECTED Final    Comment: (NOTE) The Coronavirus on the Respiratory Panel, DOES NOT test for the novel  Coronavirus (2019 nCoV)    Coronavirus HKU1 NOT DETECTED NOT DETECTED Final   Coronavirus NL63 NOT DETECTED NOT DETECTED Final   Coronavirus OC43 NOT DETECTED NOT DETECTED Final   Metapneumovirus NOT DETECTED NOT DETECTED Final   Rhinovirus / Enterovirus NOT DETECTED  NOT DETECTED Final   Influenza A NOT DETECTED NOT DETECTED Final   Influenza B NOT DETECTED NOT DETECTED Final   Parainfluenza Virus 1 NOT DETECTED NOT DETECTED Final   Parainfluenza Virus 2 NOT DETECTED NOT DETECTED Final   Parainfluenza Virus 3 NOT DETECTED NOT DETECTED Final   Parainfluenza Virus 4 NOT DETECTED NOT DETECTED Final   Respiratory Syncytial Virus NOT DETECTED NOT DETECTED Final   Bordetella pertussis NOT DETECTED NOT DETECTED Final   Bordetella Parapertussis NOT DETECTED NOT DETECTED Final   Chlamydophila pneumoniae NOT DETECTED NOT DETECTED Final   Mycoplasma pneumoniae NOT DETECTED NOT DETECTED Final    Comment: Performed at [facility], [place]. [address], [place], [place] [address]  MRSA Next Gen by PCR, Nasal     Status: None   Collection Time: 10/09/21  3:02 AM   Specimen: Nasal Mucosa; Nasal Swab  Result Value Ref Range Status   MRSA by PCR Next Gen NOT DETECTED NOT DETECTED Final    Comment: (NOTE) The GeneXpert MRSA Assay (FDA approved for NASAL specimens only), is one component of a comprehensive MRSA colonization surveillance program. It is not intended to diagnose MRSA infection nor to guide or monitor treatment for MRSA infections. Test performance is not FDA approved in patients less than 19 years old. Performed at [facility], [place] [address] [place], [place] [address]          Radiology Studies: DG Chest 2 View  Result Date: 10/08/2021 CLINICAL DATA:  Shortness of breath, chest pain EXAM: CHEST - 2 VIEW COMPARISON:  08/26/2021 FINDINGS: The heart size and mediastinal contours are within normal limits. Both lungs are clear. The visualized skeletal structures are unremarkable. IMPRESSION: No active cardiopulmonary disease. Electronically Signed   By: Rolm Baptise M.D.   On: 10/08/2021 19:14        Scheduled Meds:  albuterol  2.5 mg Nebulization QID   amLODipine  10 mg Oral Daily   apixaban  5 mg Oral BID   atorvastatin   20 mg Oral q morning   carvedilol  6.25 mg Oral BID WC   Chlorhexidine Gluconate Cloth  6 each Topical Daily   cloNIDine  0.2 mg Oral BID   dextromethorphan-guaiFENesin  1 tablet Oral BID   DULoxetine  60 mg Oral Daily   fluticasone furoate-vilanterol  1 puff Inhalation Daily   And   umeclidinium bromide  1 puff Inhalation Daily   furosemide  40 mg Oral Daily   levothyroxine  50 mcg Oral Daily   mouth rinse  15 mL Mouth Rinse  BID   methylPREDNISolone (SOLU-MEDROL) injection  60 mg Intravenous Q12H   montelukast  10 mg Oral QHS   pantoprazole  40 mg Oral Daily   phosphorus  250 mg Oral BID   pregabalin  300 mg Oral BID   sodium chloride flush  3 mL Intravenous Q12H   Continuous Infusions:  sodium chloride     sodium phosphate 15 mmol in dextrose 5 % 250 mL infusion       LOS: 2 days    Time spent: 35 minutes    Vivika Poythress A Zakaiya Lares, MD [facility]   If 7PM-7AM, please contact night-coverage [email or web address]  10/10/2021, 7:16 AM

## 2021-10-11 ENCOUNTER — Other Ambulatory Visit (HOSPITAL_COMMUNITY): Payer: Self-pay

## 2021-10-11 DIAGNOSIS — J4551 Severe persistent asthma with (acute) exacerbation: Secondary | ICD-10-CM | POA: Diagnosis not present

## 2021-10-11 LAB — CBC WITH DIFFERENTIAL/PLATELET
Abs Immature Granulocytes: 0.37 10*3/uL — ABNORMAL HIGH (ref 0.00–0.07)
Basophils Absolute: 0 10*3/uL (ref 0.0–0.1)
Basophils Relative: 0 %
Eosinophils Absolute: 0 10*3/uL (ref 0.0–0.5)
Eosinophils Relative: 0 %
HCT: 36.2 % (ref 36.0–46.0)
Hemoglobin: 11 g/dL — ABNORMAL LOW (ref 12.0–15.0)
Immature Granulocytes: 2 %
Lymphocytes Relative: 9 %
Lymphs Abs: 1.8 10*3/uL (ref 0.7–4.0)
MCH: 25.1 pg — ABNORMAL LOW (ref 26.0–34.0)
MCHC: 30.4 g/dL (ref 30.0–36.0)
MCV: 82.5 fL (ref 80.0–100.0)
Monocytes Absolute: 1.3 10*3/uL — ABNORMAL HIGH (ref 0.1–1.0)
Monocytes Relative: 6 %
Neutro Abs: 17.8 10*3/uL — ABNORMAL HIGH (ref 1.7–7.7)
Neutrophils Relative %: 83 %
Platelets: 456 10*3/uL — ABNORMAL HIGH (ref 150–400)
RBC: 4.39 MIL/uL (ref 3.87–5.11)
RDW: 19.1 % — ABNORMAL HIGH (ref 11.5–15.5)
WBC: 21.2 10*3/uL — ABNORMAL HIGH (ref 4.0–10.5)
nRBC: 0.1 % (ref 0.0–0.2)

## 2021-10-11 LAB — COMPREHENSIVE METABOLIC PANEL
ALT: 13 U/L (ref 0–44)
AST: 12 U/L — ABNORMAL LOW (ref 15–41)
Albumin: 3 g/dL — ABNORMAL LOW (ref 3.5–5.0)
Alkaline Phosphatase: 65 U/L (ref 38–126)
Anion gap: 8 (ref 5–15)
BUN: 17 mg/dL (ref 6–20)
CO2: 27 mmol/L (ref 22–32)
Calcium: 8.4 mg/dL — ABNORMAL LOW (ref 8.9–10.3)
Chloride: 104 mmol/L (ref 98–111)
Creatinine, Ser: 0.67 mg/dL (ref 0.44–1.00)
GFR, Estimated: >60 mL/min (ref >60)
Glucose, Bld: 224 mg/dL — ABNORMAL HIGH (ref 70–99)
Potassium: 3.5 mmol/L (ref 3.5–5.1)
Sodium: 139 mmol/L (ref 135–145)
Total Bilirubin: 0.4 mg/dL (ref 0.3–1.2)
Total Protein: 6.7 g/dL (ref 6.5–8.1)

## 2021-10-11 LAB — GLUCOSE, CAPILLARY
Glucose-Capillary: 166 mg/dL — ABNORMAL HIGH (ref 70–99)
Glucose-Capillary: 185 mg/dL — ABNORMAL HIGH (ref 70–99)
Glucose-Capillary: 220 mg/dL — ABNORMAL HIGH (ref 70–99)
Glucose-Capillary: 226 mg/dL — ABNORMAL HIGH (ref 70–99)

## 2021-10-11 LAB — MAGNESIUM: Magnesium: 2.1 mg/dL (ref 1.7–2.4)

## 2021-10-11 LAB — PHOSPHORUS: Phosphorus: 2.3 mg/dL — ABNORMAL LOW (ref 2.5–4.6)

## 2021-10-11 MED ORDER — TEZSPIRE 210 MG/1.91ML ~~LOC~~ SOAJ
210.0000 mg | SUBCUTANEOUS | 0 refills | Status: DC
  Filled 2021-10-11: qty 1.91, fill #0
  Filled 2021-10-11: qty 1.91, 28d supply, fill #0

## 2021-10-11 MED ORDER — MAGNESIUM SULFATE 2 GM/50ML IV SOLN
2.0000 g | Freq: Once | INTRAVENOUS | Status: AC
Start: 2021-10-11 — End: 2021-10-11
  Administered 2021-10-11: 2 g via INTRAVENOUS
  Filled 2021-10-11: qty 50

## 2021-10-11 MED ORDER — AZITHROMYCIN 250 MG PO TABS
500.0000 mg | ORAL_TABLET | Freq: Every day | ORAL | Status: AC
Start: 2021-10-11 — End: 2021-10-15
  Administered 2021-10-11 – 2021-10-15 (×5): 500 mg via ORAL
  Filled 2021-10-11 (×5): qty 2

## 2021-10-11 MED ORDER — ALBUTEROL SULFATE (2.5 MG/3ML) 0.083% IN NEBU
2.5000 mg | INHALATION_SOLUTION | RESPIRATORY_TRACT | Status: DC
  Administered 2021-10-11 – 2021-10-13 (×10): 2.5 mg via RESPIRATORY_TRACT
  Filled 2021-10-11 (×10): qty 3

## 2021-10-11 MED ORDER — IPRATROPIUM-ALBUTEROL 0.5-2.5 (3) MG/3ML IN SOLN
3.0000 mL | Freq: Four times a day (QID) | RESPIRATORY_TRACT | Status: AC | PRN
  Administered 2021-10-11 (×2): 3 mL via RESPIRATORY_TRACT
  Filled 2021-10-11 (×2): qty 3

## 2021-10-11 MED ORDER — ALBUTEROL SULFATE (2.5 MG/3ML) 0.083% IN NEBU
2.5000 mg | INHALATION_SOLUTION | Freq: Three times a day (TID) | RESPIRATORY_TRACT | Status: DC
  Administered 2021-10-11: 2.5 mg via RESPIRATORY_TRACT
  Filled 2021-10-11: qty 3

## 2021-10-11 MED ORDER — POTASSIUM PHOSPHATES 15 MMOLE/5ML IV SOLN
15.0000 mmol | Freq: Once | INTRAVENOUS | Status: AC
  Administered 2021-10-11: 15 mmol via INTRAVENOUS
  Filled 2021-10-11: qty 5

## 2021-10-11 NOTE — Progress Notes (Deleted)
Discharge instructions provided to patient. All medications, follow up appointments, and discharge instructions provided. IV out. Monitor off CCMD notified. Discharging home with family.  Era Bumpers, RN

## 2021-10-11 NOTE — Plan of Care (Signed)

## 2021-10-11 NOTE — Telephone Encounter (Signed)
Pt scheduled for self admin training with pharmacy team on 11/05/21. Patient expresses hesitation regarding self administering. Rx sent to [facility] to be couriered over.   Joseph Art, Pharm.D. PGY-1 Pharmacy Resident 10/11/2021 2:57 PM

## 2021-10-11 NOTE — Progress Notes (Signed)
PROGRESS NOTE    April [name]  GEZ:[id number] DOB: Aug 27, 1970 DOA: 10/08/2021 PCP: [name], MD   Brief Narrative: 51 year old with past medical history significant for asthma, COPD, A-fib on Eliquis, CHF, GERD, obesity, diabetes type 2 hypothyroidism who presents complaining of chest pain, shortness of breath and asthma exacerbation.  She reports wheezing and using her inhalers without significant relief.  She recently completed a steroid taper on Friday.  3 days after she presented with worsening shortness of breath.   Assessment & Plan:   Active Problems:   Severe persistent asthma with acute exacerbation in adult   HTN (hypertension)   AF (paroxysmal atrial fibrillation) (HCC)   Acute respiratory failure (HCC)   Chronic heart failure with preserved ejection fraction (HFpEF) (HCC)   Hypothyroidism   Hypokalemia   Hyperlipidemia   GERD (gastroesophageal reflux disease)   Asthma exacerbation   Body mass index 50.0-59.9, adult ([place])  1-Severe persistent Asthma with acute exacerbation: Continue with IV Solu-Medrol 40 mg twice daily Continue with Singulair Continue with Breo Ellipta and Incruse. Continue with guaifenesin. Continue with flutter valve. Continue with the schedule albuterol. PPI twice daily to help prevent reflux. Discussed with patient benefit of weight loss/  Plan to continue with current management, she notice some improvement but not at baseline. Will also add Azithromycin.   2-Acute on Chronic Hypoxic Respiratory Failure Use 4 L of oxygen at home Currently on 4 L, wean as tolerated  3-Hypertension: Continue with amlodipine, Catapres, Coreg, Lasix and as needed hydralazine  4-paroxysmal A-fib: Continue with Eliquis and Coreg  5-Chronic diastolic heart failure: Continue with oral Lasix  GERD: Change PPI to twice daily  Hyperlipidemia continue Lipitor Hypothyroidism: Continue with Synthroid. Leukocytosis; Secondary to steroids. Afebrile.  Morbid  obesity : Needs lifestyle modification Hypophosphatemia; Replete IV> and oral.   Estimated body mass index is 54.98 kg/m as calculated from the following:   Height as of this encounter: '5\' 6"'$  (1.676 m).   Weight as of this encounter: 154.5 kg.   DVT prophylaxis: Eliquis Code Status: full code Family Communication: Care discussed with patient.  Disposition Plan:  Status is: Inpatient Remains inpatient appropriate because: management of asthma exacerbation. Transfer to med surgery     Consultants:  None  Procedures:  None  Antimicrobials:    Subjective: She had more trouble with dyspnea early morning.  She is still having SOB, wheezing, tightness. Not at baseline.   Objective: Vitals:   10/11/21 0456 10/11/21 0918 10/11/21 1252 10/11/21 1409  BP:    (!) 151/85  Pulse:    78  Resp:    18  Temp:    98 F (36.7 C)  TempSrc:    Oral  SpO2:  100% 100% 100%  Weight: (!) 154.5 kg     Height:        Intake/Output Summary (Last 24 hours) at 10/11/2021 1612 Last data filed at 10/11/2021 1130 Gross per 24 hour  Intake 2623 ml  Output 1700 ml  Net 923 ml    Filed Weights   10/09/21 0505 10/11/21 0456  Weight: (!) 148.5 kg (!) 154.5 kg    Examination:  General exam: Obese.  Respiratory system: BL wheezing.  Cardiovascular system: S 1, S 2 RRR Gastrointestinal system: BS present, soft, nt Central nervous system: alert, non focal.  Extremities: no edema     Data Reviewed: I have personally reviewed following labs and imaging studies  CBC: Recent Labs  Lab 10/08/21 1802 10/09/21 0304 10/10/21 0319 10/11/21  0535  WBC 18.2* 18.4* 26.1* 21.2*  NEUTROABS  --   --  22.2* 17.8*  HGB 11.7* 11.5* 11.4* 11.0*  HCT 38.3 38.1 38.1 36.2  MCV 82.9 83.9 83.6 82.5  PLT 426* 410* 418* 456*    Basic Metabolic Panel: Recent Labs  Lab 10/08/21 1802 10/08/21 2009 10/09/21 0304 10/10/21 0319 10/11/21 0535  NA 143  --  136 138 139  K 3.4*  --  3.5 4.2 3.5  CL  109  --  106 106 104  CO2 27  --  20* 25 27  GLUCOSE 106*  --  257* 146* 224*  BUN 9  --  '8 14 17  '$ CREATININE 0.81  --  0.71 0.65 0.67  CALCIUM 8.6*  --  8.3* 8.7* 8.4*  MG  --   --   --  2.1 2.1  PHOS  --  2.3*  --  2.1* 2.3*    GFR: Estimated Creatinine Clearance: 132.2 mL/min (by C-G formula based on SCr of 0.67 mg/dL). Liver Function Tests: Recent Labs  Lab 10/08/21 2009 10/09/21 0304 10/10/21 0319 10/11/21 0535  AST 13* 18 12* 12*  ALT '12 13 13 13  '$ ALKPHOS 63 64 66 65  BILITOT 0.2* 0.2* 0.5 0.4  PROT 6.8 7.1 6.7 6.7  ALBUMIN 3.1* 3.3* 3.1* 3.0*    No results for input(s): "LIPASE", "AMYLASE" in the last 168 hours. No results for input(s): "AMMONIA" in the last 168 hours. Coagulation Profile: No results for input(s): "INR", "PROTIME" in the last 168 hours. Cardiac Enzymes: Recent Labs  Lab 10/08/21 2009  CKTOTAL 108    BNP (last 3 results) No results for input(s): "PROBNP" in the last 8760 hours. HbA1C: No results for input(s): "HGBA1C" in the last 72 hours. CBG: Recent Labs  Lab 10/10/21 1640 10/10/21 2120 10/11/21 0818 10/11/21 1152 10/11/21 1546  GLUCAP 160* 187* 166* 185* 220*    Lipid Profile: No results for input(s): "CHOL", "HDL", "LDLCALC", "TRIG", "CHOLHDL", "LDLDIRECT" in the last 72 hours. Thyroid Function Tests: Recent Labs    10/09/21 0304  TSH 0.435    Anemia Panel: No results for input(s): "VITAMINB12", "FOLATE", "FERRITIN", "TIBC", "IRON", "RETICCTPCT" in the last 72 hours. Sepsis Labs: No results for input(s): "PROCALCITON", "LATICACIDVEN" in the last 168 hours.  Recent Results (from the past 240 hour(s))  SARS Coronavirus 2 by RT PCR (hospital order, performed in [facility] hospital lab) *cepheid single result test* Anterior Nasal Swab     Status: None   Collection Time: 10/08/21 10:22 PM   Specimen: Anterior Nasal Swab  Result Value Ref Range Status   SARS Coronavirus 2 by RT PCR NEGATIVE NEGATIVE Final    Comment:  (NOTE) SARS-CoV-2 target nucleic acids are NOT DETECTED.  The SARS-CoV-2 RNA is generally detectable in upper and lower respiratory specimens during the acute phase of infection. The lowest concentration of SARS-CoV-2 viral copies this assay can detect is 250 copies / mL. A negative result does not preclude SARS-CoV-2 infection and should not be used as the sole basis for treatment or other patient management decisions.  A negative result may occur with improper specimen collection / handling, submission of specimen other than nasopharyngeal swab, presence of viral mutation(s) within the areas targeted by this assay, and inadequate number of viral copies (<250 copies / mL). A negative result must be combined with clinical observations, patient history, and epidemiological information.  Fact Sheet for Patients:   https://www.patel.info/  Fact Sheet for Healthcare Providers: https://hall.com/  This  test is not yet approved or  cleared by the [place] and has been authorized for detection and/or diagnosis of SARS-CoV-2 by FDA under an Emergency Use Authorization (EUA).  This EUA will remain in effect (meaning this test can be used) for the duration of the COVID-19 declaration under Section 564(b)(1) of the Act, 21 U.S.C. section 360bbb-3(b)(1), unless the authorization is terminated or revoked sooner.  Performed at [facility], [place] [address], [place], [place] [address]   Respiratory (~20 pathogens) panel by PCR     Status: None   Collection Time: 10/09/21  3:02 AM   Specimen: Nasopharyngeal Swab; Respiratory  Result Value Ref Range Status   Adenovirus NOT DETECTED NOT DETECTED Final   Coronavirus 229E NOT DETECTED NOT DETECTED Final    Comment: (NOTE) The Coronavirus on the Respiratory Panel, DOES NOT test for the novel  Coronavirus (2019 nCoV)    Coronavirus HKU1 NOT DETECTED NOT DETECTED Final    Coronavirus NL63 NOT DETECTED NOT DETECTED Final   Coronavirus OC43 NOT DETECTED NOT DETECTED Final   Metapneumovirus NOT DETECTED NOT DETECTED Final   Rhinovirus / Enterovirus NOT DETECTED NOT DETECTED Final   Influenza A NOT DETECTED NOT DETECTED Final   Influenza B NOT DETECTED NOT DETECTED Final   Parainfluenza Virus 1 NOT DETECTED NOT DETECTED Final   Parainfluenza Virus 2 NOT DETECTED NOT DETECTED Final   Parainfluenza Virus 3 NOT DETECTED NOT DETECTED Final   Parainfluenza Virus 4 NOT DETECTED NOT DETECTED Final   Respiratory Syncytial Virus NOT DETECTED NOT DETECTED Final   Bordetella pertussis NOT DETECTED NOT DETECTED Final   Bordetella Parapertussis NOT DETECTED NOT DETECTED Final   Chlamydophila pneumoniae NOT DETECTED NOT DETECTED Final   Mycoplasma pneumoniae NOT DETECTED NOT DETECTED Final    Comment: Performed at [facility], [place]. [address] [place], [place] [address]  MRSA Next Gen by PCR, Nasal     Status: None   Collection Time: 10/09/21  3:02 AM   Specimen: Nasal Mucosa; Nasal Swab  Result Value Ref Range Status   MRSA by PCR Next Gen NOT DETECTED NOT DETECTED Final    Comment: (NOTE) The GeneXpert MRSA Assay (FDA approved for NASAL specimens only), is one component of a comprehensive MRSA colonization surveillance program. It is not intended to diagnose MRSA infection nor to guide or monitor treatment for MRSA infections. Test performance is not FDA approved in patients less than 57 years old. Performed at [facility], [place] [address] [place],  [address]          Radiology Studies: No results found.      Scheduled Meds:  albuterol  2.5 mg Nebulization TID   amLODipine  10 mg Oral Daily   apixaban  5 mg Oral BID   atorvastatin  20 mg Oral q morning   azithromycin  500 mg Oral Daily   carvedilol  6.25 mg Oral BID WC   Chlorhexidine Gluconate Cloth  6 each Topical Daily   cloNIDine  0.2 mg Oral BID    dextromethorphan-guaiFENesin  1 tablet Oral BID   DULoxetine  60 mg Oral Daily   fluticasone furoate-vilanterol  1 puff Inhalation Daily   And   umeclidinium bromide  1 puff Inhalation Daily   furosemide  40 mg Oral Daily   levothyroxine  50 mcg Oral Daily   mouth rinse  15 mL Mouth Rinse BID   methylPREDNISolone (SOLU-MEDROL) injection  60 mg Intravenous Q12H  montelukast  10 mg Oral QHS   pantoprazole  40 mg Oral BID   phosphorus  250 mg Oral BID   pregabalin  300 mg Oral BID   sodium chloride flush  3 mL Intravenous Q12H   Continuous Infusions:  sodium chloride Stopped (10/10/21 0836)     LOS: 3 days    Time spent: 35 minutes    Brodin Gelpi A Yug Loria, MD [facility]   If 7PM-7AM, please contact night-coverage [email or web address]  10/11/2021, 4:12 PM

## 2021-10-12 ENCOUNTER — Other Ambulatory Visit (HOSPITAL_COMMUNITY): Payer: Self-pay

## 2021-10-12 DIAGNOSIS — J4551 Severe persistent asthma with (acute) exacerbation: Secondary | ICD-10-CM | POA: Diagnosis not present

## 2021-10-12 LAB — COMPREHENSIVE METABOLIC PANEL
ALT: 24 U/L (ref 0–44)
AST: 18 U/L (ref 15–41)
Albumin: 3.2 g/dL — ABNORMAL LOW (ref 3.5–5.0)
Alkaline Phosphatase: 64 U/L (ref 38–126)
Anion gap: 9 (ref 5–15)
BUN: 22 mg/dL — ABNORMAL HIGH (ref 6–20)
CO2: 27 mmol/L (ref 22–32)
Calcium: 8.1 mg/dL — ABNORMAL LOW (ref 8.9–10.3)
Chloride: 102 mmol/L (ref 98–111)
Creatinine, Ser: 0.77 mg/dL (ref 0.44–1.00)
GFR, Estimated: >60 mL/min (ref >60)
Glucose, Bld: 244 mg/dL — ABNORMAL HIGH (ref 70–99)
Potassium: 3.6 mmol/L (ref 3.5–5.1)
Sodium: 138 mmol/L (ref 135–145)
Total Bilirubin: 0.3 mg/dL (ref 0.3–1.2)
Total Protein: 6.7 g/dL (ref 6.5–8.1)

## 2021-10-12 LAB — GLUCOSE, CAPILLARY
Glucose-Capillary: 214 mg/dL — ABNORMAL HIGH (ref 70–99)
Glucose-Capillary: 154 mg/dL — ABNORMAL HIGH (ref 70–99)
Glucose-Capillary: 212 mg/dL — ABNORMAL HIGH (ref 70–99)
Glucose-Capillary: 185 mg/dL — ABNORMAL HIGH (ref 70–99)

## 2021-10-12 LAB — CBC WITH DIFFERENTIAL/PLATELET
Abs Immature Granulocytes: 0.68 10*3/uL — ABNORMAL HIGH (ref 0.00–0.07)
Basophils Absolute: 0.1 10*3/uL (ref 0.0–0.1)
Basophils Relative: 0 %
Eosinophils Absolute: 0 10*3/uL (ref 0.0–0.5)
Eosinophils Relative: 0 %
HCT: 37.9 % (ref 36.0–46.0)
Hemoglobin: 11.4 g/dL — ABNORMAL LOW (ref 12.0–15.0)
Immature Granulocytes: 3 %
Lymphocytes Relative: 8 %
Lymphs Abs: 1.9 10*3/uL (ref 0.7–4.0)
MCH: 24.7 pg — ABNORMAL LOW (ref 26.0–34.0)
MCHC: 30.1 g/dL (ref 30.0–36.0)
MCV: 82 fL (ref 80.0–100.0)
Monocytes Absolute: 1.1 10*3/uL — ABNORMAL HIGH (ref 0.1–1.0)
Monocytes Relative: 5 %
Neutro Abs: 19.3 10*3/uL — ABNORMAL HIGH (ref 1.7–7.7)
Neutrophils Relative %: 84 %
Platelets: 448 10*3/uL — ABNORMAL HIGH (ref 150–400)
RBC: 4.62 MIL/uL (ref 3.87–5.11)
RDW: 19 % — ABNORMAL HIGH (ref 11.5–15.5)
WBC: 23 10*3/uL — ABNORMAL HIGH (ref 4.0–10.5)
nRBC: 0.1 % (ref 0.0–0.2)

## 2021-10-12 LAB — PHOSPHORUS: Phosphorus: 3.2 mg/dL (ref 2.5–4.6)

## 2021-10-12 LAB — MAGNESIUM: Magnesium: 2.5 mg/dL — ABNORMAL HIGH (ref 1.7–2.4)

## 2021-10-12 MED ORDER — CLONAZEPAM 0.5 MG PO TABS
0.5000 mg | ORAL_TABLET | Freq: Once | ORAL | Status: AC
  Administered 2021-10-12: 0.5 mg via ORAL
  Filled 2021-10-12: qty 1

## 2021-10-12 NOTE — Progress Notes (Signed)
Inpatient Diabetes Program Recommendations  AACE/ADA: New Consensus Statement on Inpatient Glycemic Control (2015)  Target Ranges:  Prepandial:   less than 140 mg/dL      Peak postprandial:   less than 180 mg/dL (1-2 hours)      Critically ill patients:  140 - 180 mg/dL   Lab Results  Component Value Date   GLUCAP 154 (H) 10/12/2021   HGBA1C 5.9 (H) 05/24/2021    Review of Glycemic Control  Diabetes history: Pre-diabetes Outpatient Diabetes medications: Ozempic 1 mg weekly Current orders for Inpatient glycemic control: None On Solumedrol 60 Q12H   Inpatient Diabetes Program Recommendations:    Consider adding Novolog 0-15 units TID with meals and 0-5 HS while on steroids.  Will follow.   Thank you. Lorenda Peck, RD, LDN, CDE Inpatient Diabetes Coordinator [phone]

## 2021-10-12 NOTE — Progress Notes (Signed)
PROGRESS NOTE    April [name]  WVP:[id number] DOB: [birth year] DOA: 10/08/2021 PCP: [name], MD   Brief Narrative: 51 year old with past medical history significant for asthma, COPD, A-fib on Eliquis, CHF, GERD, obesity, diabetes type 2 hypothyroidism who presents complaining of chest pain, shortness of breath and asthma exacerbation.  She reports wheezing and using her inhalers without significant relief.  She recently completed a steroid taper on Friday.  3 days after she presented with worsening shortness of breath.   Assessment & Plan:   Active Problems:   Severe persistent asthma with acute exacerbation in adult   HTN (hypertension)   AF (paroxysmal atrial fibrillation) (HCC)   Acute respiratory failure (HCC)   Chronic heart failure with preserved ejection fraction (HFpEF) (HCC)   Hypothyroidism   Hypokalemia   Hyperlipidemia   GERD (gastroesophageal reflux disease)   Asthma exacerbation   Body mass index 50.0-59.9, adult ([place])  1-Severe persistent Asthma with acute exacerbation: Continue with IV Solu-Medrol 40 mg twice daily Continue with Singulair Continue with Breo Ellipta and Incruse. Continue with guaifenesin. Continue with flutter valve. Continue with the schedule albuterol. PPI twice daily to help prevent reflux. Discussed with patient benefit of weight loss/  Continue with Azithromycin.  Had panic attack last night , received klonopin.  Continue with current management   2-Acute on Chronic Hypoxic Respiratory Failure Use 4 L of oxygen at home Currently on 4 L, wean as tolerated  3-Hypertension: Continue with amlodipine, Catapres, Coreg, Lasix and as needed hydralazine  4-paroxysmal A-fib: Continue with Eliquis and Coreg  5-Chronic diastolic heart failure: Continue with oral Lasix  GERD: Change PPI to twice daily  Hyperlipidemia Continue Lipitor Hypothyroidism: Continue with Synthroid. Leukocytosis; Secondary to steroids. Afebrile.  Morbid  obesity : Needs lifestyle modification Hypophosphatemia; Replaced.   Estimated body mass index is 53.16 kg/m as calculated from the following:   Height as of this encounter: '5\' 6"'$  (1.676 m).   Weight as of this encounter: 149.4 kg.   DVT prophylaxis: Eliquis Code Status: full code Family Communication: Care discussed with patient.  Disposition Plan:  Status is: Inpatient Remains inpatient appropriate because: management of asthma exacerbation. Transfer to med surgery     Consultants:  None  Procedures:  None  Antimicrobials:    Subjective: She feel she is improving slowly. She had panic attack last night.  She would like IV steroids night dose earlier.   Objective: Vitals:   10/12/21 1006 10/12/21 1031 10/12/21 1138 10/12/21 1341  BP: (!) 147/78   (!) 152/83  Pulse:    73  Resp:    (!) 22  Temp:    98.5 F (36.9 C)  TempSrc:    Oral  SpO2:  97% 100% 100%  Weight:      Height:        Intake/Output Summary (Last 24 hours) at 10/12/2021 1550 Last data filed at 10/12/2021 1537 Gross per 24 hour  Intake 1794 ml  Output 3100 ml  Net -1306 ml    Filed Weights   10/11/21 0456 10/12/21 0500 10/12/21 0621  Weight: (!) 154.5 kg (!) 155.8 kg (!) 149.4 kg    Examination:  General exam: Obese.  Respiratory system: BL wheezing Cardiovascular system: S 1, S 2 RRR Gastrointestinal system: BBS present, soft, nt Central nervous system: Alert, non focal.  Extremities: No edema     Data Reviewed: I have personally reviewed following labs and imaging studies  CBC: Recent Labs  Lab 10/08/21 1802 10/09/21 0304 10/10/21  0319 10/11/21 0535 10/12/21 0510  WBC 18.2* 18.4* 26.1* 21.2* 23.0*  NEUTROABS  --   --  22.2* 17.8* 19.3*  HGB 11.7* 11.5* 11.4* 11.0* 11.4*  HCT 38.3 38.1 38.1 36.2 37.9  MCV 82.9 83.9 83.6 82.5 82.0  PLT 426* 410* 418* 456* 448*    Basic Metabolic Panel: Recent Labs  Lab 10/08/21 1802 10/08/21 2009 10/09/21 0304 10/10/21 0319  10/11/21 0535 10/12/21 0510  NA 143  --  136 138 139 138  K 3.4*  --  3.5 4.2 3.5 3.6  CL 109  --  106 106 104 102  CO2 27  --  20* '25 27 27  '$ GLUCOSE 106*  --  257* 146* 224* 244*  BUN 9  --  '8 14 17 '$ 22*  CREATININE 0.81  --  0.71 0.65 0.67 0.77  CALCIUM 8.6*  --  8.3* 8.7* 8.4* 8.1*  MG  --   --   --  2.1 2.1 2.5*  PHOS  --  2.3*  --  2.1* 2.3* 3.2    GFR: Estimated Creatinine Clearance: 129.4 mL/min (by C-G formula based on SCr of 0.77 mg/dL). Liver Function Tests: Recent Labs  Lab 10/08/21 2009 10/09/21 0304 10/10/21 0319 10/11/21 0535 10/12/21 0510  AST 13* 18 12* 12* 18  ALT '12 13 13 13 24  '$ ALKPHOS 63 64 66 65 64  BILITOT 0.2* 0.2* 0.5 0.4 0.3  PROT 6.8 7.1 6.7 6.7 6.7  ALBUMIN 3.1* 3.3* 3.1* 3.0* 3.2*    No results for input(s): "LIPASE", "AMYLASE" in the last 168 hours. No results for input(s): "AMMONIA" in the last 168 hours. Coagulation Profile: No results for input(s): "INR", "PROTIME" in the last 168 hours. Cardiac Enzymes: Recent Labs  Lab 10/08/21 2009  CKTOTAL 108    BNP (last 3 results) No results for input(s): "PROBNP" in the last 8760 hours. HbA1C: No results for input(s): "HGBA1C" in the last 72 hours. CBG: Recent Labs  Lab 10/11/21 1152 10/11/21 1546 10/11/21 2125 10/12/21 0740 10/12/21 1213  GLUCAP 185* 220* 226* 214* 154*    Lipid Profile: No results for input(s): "CHOL", "HDL", "LDLCALC", "TRIG", "CHOLHDL", "LDLDIRECT" in the last 72 hours. Thyroid Function Tests: No results for input(s): "TSH", "T4TOTAL", "FREET4", "T3FREE", "THYROIDAB" in the last 72 hours.  Anemia Panel: No results for input(s): "VITAMINB12", "FOLATE", "FERRITIN", "TIBC", "IRON", "RETICCTPCT" in the last 72 hours. Sepsis Labs: No results for input(s): "PROCALCITON", "LATICACIDVEN" in the last 168 hours.  Recent Results (from the past 240 hour(s))  SARS Coronavirus 2 by RT PCR (hospital order, performed in [facility] hospital lab) *cepheid single result  test* Anterior Nasal Swab     Status: None   Collection Time: 10/08/21 10:22 PM   Specimen: Anterior Nasal Swab  Result Value Ref Range Status   SARS Coronavirus 2 by RT PCR NEGATIVE NEGATIVE Final    Comment: (NOTE) SARS-CoV-2 target nucleic acids are NOT DETECTED.  The SARS-CoV-2 RNA is generally detectable in upper and lower respiratory specimens during the acute phase of infection. The lowest concentration of SARS-CoV-2 viral copies this assay can detect is 250 copies / mL. A negative result does not preclude SARS-CoV-2 infection and should not be used as the sole basis for treatment or other patient management decisions.  A negative result may occur with improper specimen collection / handling, submission of specimen other than nasopharyngeal swab, presence of viral mutation(s) within the areas targeted by this assay, and inadequate number of viral copies (<250 copies /  mL). A negative result must be combined with clinical observations, patient history, and epidemiological information.  Fact Sheet for Patients:   https://www.patel.info/  Fact Sheet for Healthcare Providers: https://hall.com/  This test is not yet approved or  cleared by the [place] FDA and has been authorized for detection and/or diagnosis of SARS-CoV-2 by FDA under an Emergency Use Authorization (EUA).  This EUA will remain in effect (meaning this test can be used) for the duration of the COVID-19 declaration under Section 564(b)(1) of the Act, 21 U.S.C. section 360bbb-3(b)(1), unless the authorization is terminated or revoked sooner.  Performed at [facility], [place] [address] [place], [place] [address]   Respiratory (~20 pathogens) panel by PCR     Status: None   Collection Time: 10/09/21  3:02 AM   Specimen: Nasopharyngeal Swab; Respiratory  Result Value Ref Range Status   Adenovirus NOT DETECTED NOT DETECTED Final   Coronavirus 229E  NOT DETECTED NOT DETECTED Final    Comment: (NOTE) The Coronavirus on the Respiratory Panel, DOES NOT test for the novel  Coronavirus (2019 nCoV)    Coronavirus HKU1 NOT DETECTED NOT DETECTED Final   Coronavirus NL63 NOT DETECTED NOT DETECTED Final   Coronavirus OC43 NOT DETECTED NOT DETECTED Final   Metapneumovirus NOT DETECTED NOT DETECTED Final   Rhinovirus / Enterovirus NOT DETECTED NOT DETECTED Final   Influenza A NOT DETECTED NOT DETECTED Final   Influenza B NOT DETECTED NOT DETECTED Final   Parainfluenza Virus 1 NOT DETECTED NOT DETECTED Final   Parainfluenza Virus 2 NOT DETECTED NOT DETECTED Final   Parainfluenza Virus 3 NOT DETECTED NOT DETECTED Final   Parainfluenza Virus 4 NOT DETECTED NOT DETECTED Final   Respiratory Syncytial Virus NOT DETECTED NOT DETECTED Final   Bordetella pertussis NOT DETECTED NOT DETECTED Final   Bordetella Parapertussis NOT DETECTED NOT DETECTED Final   Chlamydophila pneumoniae NOT DETECTED NOT DETECTED Final   Mycoplasma pneumoniae NOT DETECTED NOT DETECTED Final    Comment: Performed at [facility], [place]. [address], [place], [place] [address]  MRSA Next Gen by PCR, Nasal     Status: None   Collection Time: 10/09/21  3:02 AM   Specimen: Nasal Mucosa; Nasal Swab  Result Value Ref Range Status   MRSA by PCR Next Gen NOT DETECTED NOT DETECTED Final    Comment: (NOTE) The GeneXpert MRSA Assay (FDA approved for NASAL specimens only), is one component of a comprehensive MRSA colonization surveillance program. It is not intended to diagnose MRSA infection nor to guide or monitor treatment for MRSA infections. Test performance is not FDA approved in patients less than 19 years old. Performed at [facility], [place] [address], [place], [place] [address]          Radiology Studies: No results found.      Scheduled Meds:  albuterol  2.5 mg Nebulization Q4H WA   amLODipine  10 mg Oral Daily   apixaban  5 mg Oral BID    atorvastatin  20 mg Oral q morning   azithromycin  500 mg Oral Daily   carvedilol  6.25 mg Oral BID WC   Chlorhexidine Gluconate Cloth  6 each Topical Daily   cloNIDine  0.2 mg Oral BID   dextromethorphan-guaiFENesin  1 tablet Oral BID   DULoxetine  60 mg Oral Daily   fluticasone furoate-vilanterol  1 puff Inhalation Daily   And   umeclidinium bromide  1 puff Inhalation Daily   furosemide  40 mg Oral Daily   levothyroxine  50 mcg Oral Daily   mouth rinse  15 mL Mouth Rinse BID   methylPREDNISolone (SOLU-MEDROL) injection  60 mg Intravenous Q12H   montelukast  10 mg Oral QHS   pantoprazole  40 mg Oral BID   phosphorus  250 mg Oral BID   pregabalin  300 mg Oral BID   sodium chloride flush  3 mL Intravenous Q12H   Continuous Infusions:  sodium chloride Stopped (10/10/21 0836)     LOS: 4 days    Time spent: 35 minutes    Flossie Wexler A Shray Hunley, MD [facility]   If 7PM-7AM, please contact night-coverage [email or web address]  10/12/2021, 3:50 PM

## 2021-10-12 NOTE — Plan of Care (Signed)

## 2021-10-12 NOTE — Care Management Important Message (Signed)
Important Message  Patient Details IM Letter given to the Patient Name: April [name] MRN: [id number] Date of Birth: 04-Mar-1971   Medicare Important Message Given:  Yes     Kerin Salen 10/12/2021, 11:07 AM

## 2021-10-12 NOTE — Telephone Encounter (Signed)
Delivery instructions have been updated in [place], medication will be couriered to [facility] by 11/02/21.  Rx has been processed in [facility] and the patient has no copay at this time.

## 2021-10-12 NOTE — Evaluation (Addendum)
Physical Therapy Evaluation Patient Details Name: April [name] MRN: [id number] DOB: 1971/04/14 Today's Date: 10/12/2021  History of Present Illness  51 year old female admitted 10/08/21 with chest pain, SOB, asthma exacerbation. Pt with past medical history significant for asthma, COPD, A-fib on Eliquis, CHF, GERD, obesity, diabetes type 2 hypothyroidism.  Clinical Impression  Pt ambulated 120' without an assistive device, no loss of balance, 3/4 dyspnea, SaO2 97% on 4L O2. Pt is independent with mobility at baseline. She is mobilizing independently, no further PT indicated, will sign off.  She would benefit from a rollator to allow for increased ambulation distance tolerance.        Recommendations for follow up therapy are one component of a multi-disciplinary discharge planning process, led by the attending physician.  Recommendations may be updated based on patient status, additional functional criteria and insurance authorization.  Follow Up Recommendations No PT follow up    Assistance Recommended at Discharge None  Patient can return home with the following  Help with stairs or ramp for entrance    Equipment Recommendations Rollator (4 wheels);Other (comment) (bariatric RW)  Recommendations for Other Services       Functional Status Assessment Patient has had a recent decline in their functional status and demonstrates the ability to make significant improvements in function in a reasonable and predictable amount of time.     Precautions / Restrictions Precautions Precautions: None Precaution Comments: pt denies falls in past 6 months Restrictions Weight Bearing Restrictions: No      Mobility  Bed Mobility Overal bed mobility: Modified Independent             General bed mobility comments: HOB up    Transfers Overall transfer level: Independent Equipment used: None                    Ambulation/Gait Ambulation/Gait assistance: Independent Gait  Distance (Feet): 120 Feet Assistive device: None Gait Pattern/deviations: WFL(Within Functional Limits), Decreased stride length Gait velocity: WFL     General Gait Details: distance limited by 3/4 dyspnea, pt reports chronic BLE pain with walking (meds administered at start of session), SaO2 97% on 4L O2 while walking, pt reports at baseline she's able to walk faster  Stairs            Wheelchair Mobility    Modified Rankin (Stroke Patients Only)       Balance Overall balance assessment: No apparent balance deficits (not formally assessed)                                           Pertinent Vitals/Pain Pain Assessment Pain Assessment: 0-10 Pain Score: 7  Pain Location: BLEs (chronic per pt) Pain Descriptors / Indicators: Aching Pain Intervention(s): Limited activity within patient's tolerance, Monitored during session, RN gave pain meds during session    Home Living Family/patient expects to be discharged to:: Private residence Living Arrangements: Alone Available Help at Discharge: Other (Comment) (pt stated she doesn't have any assistance available) Type of Home: Apartment Home Access: Level entry       Home Layout: One level Home Equipment: None      Prior Function Prior Level of Function : Independent/Modified Independent;Driving             Mobility Comments: walked without AD, denies falls in past 6 months ADLs Comments: independent     Hand Dominance  Extremity/Trunk Assessment   Upper Extremity Assessment Upper Extremity Assessment: Overall WFL for tasks assessed    Lower Extremity Assessment Lower Extremity Assessment: Overall WFL for tasks assessed    Cervical / Trunk Assessment Cervical / Trunk Assessment: Normal  Communication   Communication: No difficulties  Cognition Arousal/Alertness: Awake/alert Behavior During Therapy: WFL for tasks assessed/performed Overall Cognitive Status: Within Functional  Limits for tasks assessed                                          General Comments      Exercises     Assessment/Plan    PT Assessment Patient does not need any further PT services  PT Problem List         PT Treatment Interventions      PT Goals (Current goals can be found in the Care Plan section)  Acute Rehab PT Goals Patient Stated Goal: to walk a little faster PT Goal Formulation: All assessment and education complete, DC therapy    Frequency       Co-evaluation               AM-PAC PT "6 Clicks" Mobility  Outcome Measure Help needed turning from your back to your side while in a flat bed without using bedrails?: None Help needed moving from lying on your back to sitting on the side of a flat bed without using bedrails?: None Help needed moving to and from a bed to a chair (including a wheelchair)?: None Help needed standing up from a chair using your arms (e.g., wheelchair or bedside chair)?: None Help needed to walk in hospital room?: None Help needed climbing 3-5 steps with a railing? : A Little 6 Click Score: 23    End of Session Equipment Utilized During Treatment: Oxygen Activity Tolerance: Patient limited by fatigue Patient left: in chair;with call bell/phone within reach Nurse Communication: Mobility status      Time: 1012-1027 PT Time Calculation (min) (ACUTE ONLY): 15 min   Charges:   PT Evaluation $PT Eval Moderate Complexity: 1 Mod         [name] PT 10/12/2021  Acute Rehabilitation Services  Office [phone]

## 2021-10-13 DIAGNOSIS — J4551 Severe persistent asthma with (acute) exacerbation: Secondary | ICD-10-CM | POA: Diagnosis not present

## 2021-10-13 DIAGNOSIS — J9621 Acute and chronic respiratory failure with hypoxia: Secondary | ICD-10-CM | POA: Diagnosis not present

## 2021-10-13 LAB — GLUCOSE, CAPILLARY
Glucose-Capillary: 243 mg/dL — ABNORMAL HIGH (ref 70–99)
Glucose-Capillary: 132 mg/dL — ABNORMAL HIGH (ref 70–99)
Glucose-Capillary: 218 mg/dL — ABNORMAL HIGH (ref 70–99)
Glucose-Capillary: 170 mg/dL — ABNORMAL HIGH (ref 70–99)

## 2021-10-13 LAB — CBC WITH DIFFERENTIAL/PLATELET
Abs Immature Granulocytes: 0.67 10*3/uL — ABNORMAL HIGH (ref 0.00–0.07)
Basophils Absolute: 0.1 10*3/uL (ref 0.0–0.1)
Basophils Relative: 0 %
Eosinophils Absolute: 0 10*3/uL (ref 0.0–0.5)
Eosinophils Relative: 0 %
HCT: 35.8 % — ABNORMAL LOW (ref 36.0–46.0)
Hemoglobin: 10.9 g/dL — ABNORMAL LOW (ref 12.0–15.0)
Immature Granulocytes: 3 %
Lymphocytes Relative: 11 %
Lymphs Abs: 2.4 10*3/uL (ref 0.7–4.0)
MCH: 24.8 pg — ABNORMAL LOW (ref 26.0–34.0)
MCHC: 30.4 g/dL (ref 30.0–36.0)
MCV: 81.5 fL (ref 80.0–100.0)
Monocytes Absolute: 1.7 10*3/uL — ABNORMAL HIGH (ref 0.1–1.0)
Monocytes Relative: 8 %
Neutro Abs: 17.3 10*3/uL — ABNORMAL HIGH (ref 1.7–7.7)
Neutrophils Relative %: 78 %
Platelets: 434 10*3/uL — ABNORMAL HIGH (ref 150–400)
RBC: 4.39 MIL/uL (ref 3.87–5.11)
RDW: 18.2 % — ABNORMAL HIGH (ref 11.5–15.5)
WBC: 22.1 10*3/uL — ABNORMAL HIGH (ref 4.0–10.5)
nRBC: 0.1 % (ref 0.0–0.2)

## 2021-10-13 LAB — COMPREHENSIVE METABOLIC PANEL
ALT: 23 U/L (ref 0–44)
AST: 15 U/L (ref 15–41)
Albumin: 2.9 g/dL — ABNORMAL LOW (ref 3.5–5.0)
Alkaline Phosphatase: 59 U/L (ref 38–126)
Anion gap: 6 (ref 5–15)
BUN: 22 mg/dL — ABNORMAL HIGH (ref 6–20)
CO2: 30 mmol/L (ref 22–32)
Calcium: 7.8 mg/dL — ABNORMAL LOW (ref 8.9–10.3)
Chloride: 101 mmol/L (ref 98–111)
Creatinine, Ser: 0.63 mg/dL (ref 0.44–1.00)
GFR, Estimated: >60 mL/min (ref >60)
Glucose, Bld: 162 mg/dL — ABNORMAL HIGH (ref 70–99)
Potassium: 3.9 mmol/L (ref 3.5–5.1)
Sodium: 137 mmol/L (ref 135–145)
Total Bilirubin: 0.4 mg/dL (ref 0.3–1.2)
Total Protein: 6.1 g/dL — ABNORMAL LOW (ref 6.5–8.1)

## 2021-10-13 LAB — LACTIC ACID, PLASMA
Lactic Acid, Venous: 1.1 mmol/L (ref 0.5–1.9)
Lactic Acid, Venous: 1.8 mmol/L (ref 0.5–1.9)

## 2021-10-13 LAB — BRAIN NATRIURETIC PEPTIDE: B Natriuretic Peptide: 66.3 pg/mL (ref 0.0–100.0)

## 2021-10-13 LAB — PHOSPHORUS: Phosphorus: 3.4 mg/dL (ref 2.5–4.6)

## 2021-10-13 LAB — MAGNESIUM: Magnesium: 2.2 mg/dL (ref 1.7–2.4)

## 2021-10-13 MED ORDER — TRAMADOL HCL 50 MG PO TABS
50.0000 mg | ORAL_TABLET | Freq: Once | ORAL | Status: AC
  Administered 2021-10-13: 50 mg via ORAL
  Filled 2021-10-13: qty 1

## 2021-10-13 MED ORDER — LEVALBUTEROL HCL 0.63 MG/3ML IN NEBU
0.6300 mg | INHALATION_SOLUTION | RESPIRATORY_TRACT | Status: DC | PRN
  Administered 2021-10-17: 0.63 mg via RESPIRATORY_TRACT
  Filled 2021-10-13: qty 3

## 2021-10-13 MED ORDER — IPRATROPIUM BROMIDE 0.02 % IN SOLN
0.5000 mg | RESPIRATORY_TRACT | Status: DC
  Administered 2021-10-13 – 2021-10-15 (×11): 0.5 mg via RESPIRATORY_TRACT
  Filled 2021-10-13 (×12): qty 2.5

## 2021-10-13 MED ORDER — LEVALBUTEROL HCL 0.63 MG/3ML IN NEBU
0.6300 mg | INHALATION_SOLUTION | RESPIRATORY_TRACT | Status: DC
  Administered 2021-10-13 – 2021-10-15 (×11): 0.63 mg via RESPIRATORY_TRACT
  Filled 2021-10-13 (×12): qty 3

## 2021-10-13 NOTE — Progress Notes (Signed)
PROGRESS NOTE    April [name]  MIW:[id number] DOB: [birth year] DOA: 10/08/2021 PCP: [name], MD   Brief Narrative: 51 year old with past medical history significant for asthma, COPD, A-fib on Eliquis, CHF, GERD, obesity, diabetes type 2 hypothyroidism who presents complaining of chest pain, shortness of breath and asthma exacerbation.  She reports wheezing and using her inhalers without significant relief.  She recently completed a steroid taper on Friday.  3 days after she presented with worsening shortness of breath.   Assessment & Plan:   Active Problems:   Severe persistent asthma with acute exacerbation in adult   HTN (hypertension)   AF (paroxysmal atrial fibrillation) (HCC)   Acute respiratory failure (HCC)   Chronic heart failure with preserved ejection fraction (HFpEF) (HCC)   Hypothyroidism   Hypokalemia   Hyperlipidemia   GERD (gastroesophageal reflux disease)   Asthma exacerbation   Body mass index 50.0-59.9, adult ([place])  1-Severe persistent Asthma with acute exacerbation: Continue with IV Solu-Medrol 40 mg twice daily Continue with Singulair Continue with Breo Ellipta and Incruse. Continue with guaifenesin. Continue with flutter valve. Continue with the schedule albuterol. PPI twice daily to help prevent reflux. Discussed with patient benefit of weight loss/  Continue with Azithromycin.  Had panic attack 6/16 , received klonopin.  Continue with current management , Pulmonology consulted today.   2-Acute on Chronic Hypoxic Respiratory Failure Use 4 L of oxygen at home Currently on 4 L, wean as tolerated  3-Hypertension: Continue with amlodipine, Catapres, Coreg, Lasix and as needed hydralazine  4-paroxysmal A-fib: Continue with Eliquis and Coreg.   5-Chronic diastolic heart failure: Continue with oral Lasix  GERD: Change PPI to twice daily  Hyperlipidemia Continue Lipitor Hypothyroidism: Continue with Synthroid. Leukocytosis; Secondary to  steroids. Afebrile.  Morbid obesity : Needs lifestyle modification Hypophosphatemia; Replaced.   Estimated body mass index is 55.1 kg/m as calculated from the following:   Height as of this encounter: '5\' 6"'$  (1.676 m).   Weight as of this encounter: 154.9 kg.   DVT prophylaxis: Eliquis Code Status: full code Family Communication: Care discussed with patient.  Disposition Plan:  Status is: Inpatient Remains inpatient appropriate because: management of asthma exacerbation. Transfer to med surgery     Consultants:  None  Procedures:  None  Antimicrobials:    Subjective: She is improving some, but not at baseline. Still SOB, wheezing.   Objective: Vitals:   10/13/21 0849 10/13/21 1144 10/13/21 1413 10/13/21 1605  BP:   (!) 148/86   Pulse:   73   Resp:   20   Temp:   98.3 F (36.8 C)   TempSrc:   Axillary   SpO2: 99% 100% 100% 100%  Weight:      Height:        Intake/Output Summary (Last 24 hours) at 10/13/2021 1639 Last data filed at 10/13/2021 1300 Gross per 24 hour  Intake 2640 ml  Output 2100 ml  Net 540 ml    Filed Weights   10/12/21 0500 10/12/21 0621 10/13/21 0500  Weight: (!) 155.8 kg (!) 149.4 kg (!) 154.9 kg    Examination:  General exam: Obese   Respiratory system: BL wheezing  Cardiovascular system: S 1, S 2 RRR Gastrointestinal system: BS present, soft, nt Central nervous system: Alert, non focal.  Extremities: No edema     Data Reviewed: I have personally reviewed following labs and imaging studies  CBC: Recent Labs  Lab 10/09/21 0304 10/10/21 0319 10/11/21 0535 10/12/21 0510 10/13/21 0543  WBC 18.4* 26.1* 21.2* 23.0* 22.1*  NEUTROABS  --  22.2* 17.8* 19.3* 17.3*  HGB 11.5* 11.4* 11.0* 11.4* 10.9*  HCT 38.1 38.1 36.2 37.9 35.8*  MCV 83.9 83.6 82.5 82.0 81.5  PLT 410* 418* 456* 448* 434*    Basic Metabolic Panel: Recent Labs  Lab 10/08/21 2009 10/09/21 0304 10/10/21 0319 10/11/21 0535 10/12/21 0510 10/13/21 0543  NA   --  136 138 139 138 137  K  --  3.5 4.2 3.5 3.6 3.9  CL  --  106 106 104 102 101  CO2  --  20* '25 27 27 30  '$ GLUCOSE  --  257* 146* 224* 244* 162*  BUN  --  '8 14 17 '$ 22* 22*  CREATININE  --  0.71 0.65 0.67 0.77 0.63  CALCIUM  --  8.3* 8.7* 8.4* 8.1* 7.8*  MG  --   --  2.1 2.1 2.5* 2.2  PHOS 2.3*  --  2.1* 2.3* 3.2 3.4    GFR: Estimated Creatinine Clearance: 132.4 mL/min (by C-G formula based on SCr of 0.63 mg/dL). Liver Function Tests: Recent Labs  Lab 10/09/21 0304 10/10/21 0319 10/11/21 0535 10/12/21 0510 10/13/21 0543  AST 18 12* 12* 18 15  ALT '13 13 13 24 23  '$ ALKPHOS 64 66 65 64 59  BILITOT 0.2* 0.5 0.4 0.3 0.4  PROT 7.1 6.7 6.7 6.7 6.1*  ALBUMIN 3.3* 3.1* 3.0* 3.2* 2.9*    No results for input(s): "LIPASE", "AMYLASE" in the last 168 hours. No results for input(s): "AMMONIA" in the last 168 hours. Coagulation Profile: No results for input(s): "INR", "PROTIME" in the last 168 hours. Cardiac Enzymes: Recent Labs  Lab 10/08/21 2009  CKTOTAL 108    BNP (last 3 results) No results for input(s): "PROBNP" in the last 8760 hours. HbA1C: No results for input(s): "HGBA1C" in the last 72 hours. CBG: Recent Labs  Lab 10/12/21 1213 10/12/21 1706 10/12/21 2136 10/13/21 0802 10/13/21 1152  GLUCAP 154* 212* 185* 243* 132*    Lipid Profile: No results for input(s): "CHOL", "HDL", "LDLCALC", "TRIG", "CHOLHDL", "LDLDIRECT" in the last 72 hours. Thyroid Function Tests: No results for input(s): "TSH", "T4TOTAL", "FREET4", "T3FREE", "THYROIDAB" in the last 72 hours.  Anemia Panel: No results for input(s): "VITAMINB12", "FOLATE", "FERRITIN", "TIBC", "IRON", "RETICCTPCT" in the last 72 hours. Sepsis Labs: Recent Labs  Lab 10/13/21 1151 10/13/21 1404  LATICACIDVEN 1.1 1.8    Recent Results (from the past 240 hour(s))  SARS Coronavirus 2 by RT PCR (hospital order, performed in [facility] hospital lab) *cepheid single result test* Anterior Nasal Swab     Status: None    Collection Time: 10/08/21 10:22 PM   Specimen: Anterior Nasal Swab  Result Value Ref Range Status   SARS Coronavirus 2 by RT PCR NEGATIVE NEGATIVE Final    Comment: (NOTE) SARS-CoV-2 target nucleic acids are NOT DETECTED.  The SARS-CoV-2 RNA is generally detectable in upper and lower respiratory specimens during the acute phase of infection. The lowest concentration of SARS-CoV-2 viral copies this assay can detect is 250 copies / mL. A negative result does not preclude SARS-CoV-2 infection and should not be used as the sole basis for treatment or other patient management decisions.  A negative result may occur with improper specimen collection / handling, submission of specimen other than nasopharyngeal swab, presence of viral mutation(s) within the areas targeted by this assay, and inadequate number of viral copies (<250 copies / mL). A negative result must be combined with clinical  observations, patient history, and epidemiological information.  Fact Sheet for Patients:   https://www.patel.info/  Fact Sheet for Healthcare Providers: https://hall.com/  This test is not yet approved or  cleared by the [place] FDA and has been authorized for detection and/or diagnosis of SARS-CoV-2 by FDA under an Emergency Use Authorization (EUA).  This EUA will remain in effect (meaning this test can be used) for the duration of the COVID-19 declaration under Section 564(b)(1) of the Act, 21 U.S.C. section 360bbb-3(b)(1), unless the authorization is terminated or revoked sooner.  Performed at [facility], [place] [address], [place], [place] [address]   Respiratory (~20 pathogens) panel by PCR     Status: None   Collection Time: 10/09/21  3:02 AM   Specimen: Nasopharyngeal Swab; Respiratory  Result Value Ref Range Status   Adenovirus NOT DETECTED NOT DETECTED Final   Coronavirus 229E NOT DETECTED NOT DETECTED Final     Comment: (NOTE) The Coronavirus on the Respiratory Panel, DOES NOT test for the novel  Coronavirus (2019 nCoV)    Coronavirus HKU1 NOT DETECTED NOT DETECTED Final   Coronavirus NL63 NOT DETECTED NOT DETECTED Final   Coronavirus OC43 NOT DETECTED NOT DETECTED Final   Metapneumovirus NOT DETECTED NOT DETECTED Final   Rhinovirus / Enterovirus NOT DETECTED NOT DETECTED Final   Influenza A NOT DETECTED NOT DETECTED Final   Influenza B NOT DETECTED NOT DETECTED Final   Parainfluenza Virus 1 NOT DETECTED NOT DETECTED Final   Parainfluenza Virus 2 NOT DETECTED NOT DETECTED Final   Parainfluenza Virus 3 NOT DETECTED NOT DETECTED Final   Parainfluenza Virus 4 NOT DETECTED NOT DETECTED Final   Respiratory Syncytial Virus NOT DETECTED NOT DETECTED Final   Bordetella pertussis NOT DETECTED NOT DETECTED Final   Bordetella Parapertussis NOT DETECTED NOT DETECTED Final   Chlamydophila pneumoniae NOT DETECTED NOT DETECTED Final   Mycoplasma pneumoniae NOT DETECTED NOT DETECTED Final    Comment: Performed at [facility], [place]. [address] [place], [place] [address]  MRSA Next Gen by PCR, Nasal     Status: None   Collection Time: 10/09/21  3:02 AM   Specimen: Nasal Mucosa; Nasal Swab  Result Value Ref Range Status   MRSA by PCR Next Gen NOT DETECTED NOT DETECTED Final    Comment: (NOTE) The GeneXpert MRSA Assay (FDA approved for NASAL specimens only), is one component of a comprehensive MRSA colonization surveillance program. It is not intended to diagnose MRSA infection nor to guide or monitor treatment for MRSA infections. Test performance is not FDA approved in patients less than 42 years old. Performed at [facility], [place] [address], [place], [place] [address]          Radiology Studies: No results found.      Scheduled Meds:  albuterol  2.5 mg Nebulization Q4H WA   amLODipine  10 mg Oral Daily   apixaban  5 mg Oral BID   atorvastatin  20 mg Oral q  morning   azithromycin  500 mg Oral Daily   carvedilol  6.25 mg Oral BID WC   Chlorhexidine Gluconate Cloth  6 each Topical Daily   cloNIDine  0.2 mg Oral BID   dextromethorphan-guaiFENesin  1 tablet Oral BID   DULoxetine  60 mg Oral Daily   fluticasone furoate-vilanterol  1 puff Inhalation Daily   And   umeclidinium bromide  1 puff Inhalation Daily   furosemide  40 mg Oral Daily   levothyroxine  50  mcg Oral Daily   mouth rinse  15 mL Mouth Rinse BID   methylPREDNISolone (SOLU-MEDROL) injection  60 mg Intravenous Q12H   montelukast  10 mg Oral QHS   pantoprazole  40 mg Oral BID   phosphorus  250 mg Oral BID   pregabalin  300 mg Oral BID   sodium chloride flush  3 mL Intravenous Q12H   Continuous Infusions:  sodium chloride Stopped (10/10/21 0836)     LOS: 5 days    Time spent: 35 minutes    Braelen Sproule A Abryanna Musolino, MD [facility]   If 7PM-7AM, please contact night-coverage [email or web address]  10/13/2021, 4:39 PM

## 2021-10-13 NOTE — Consult Note (Addendum)
NAME:  April [name], MRN:  [id number], DOB:  10-15-1970, LOS: 5 ADMISSION DATE:  10/08/2021, CONSULTATION DATE:  10/13/21 REFERRING MD:  Dr Marjo Bicker, CHIEF COMPLAINT:  AE-asthma failure to improved   History of Present Illness:  51 year old morbidly obese female on 4 L of oxygen on account of obesity sleep apnea and also severe asthma.  She uses oxygen continuously at night and intermittently based on subjective needs in the daytime follows with Dr. Baird Lyons.  In 2019 she is status post tracheostomy at [facility].  She is also noted to be on Dupixent.  History is complicated by noncompliance.  She is also on biologic test prior Singulair.  She has other medical problems.  Multiple hospitalizations including most recent May 2023.  This was for asthma and acute on chronic diastolic dysfunction.  She has hypertension noticed to be on multiple medications including amlodipine Coreg and clonidine.  She is sleep apnea for which she uses CPAP.  She has morbid obesity with a BMI of 53.  Anxiety for which she is on Klonopin.  She has chronic pain as well.  She is on Eliquis for atrial fibrillation  She was admitted 10/08/2021 with shortness of breath and wheezing and chest discomfort.  No fevers or chills.  We will call because of failure to improve she is actually states she is somewhat better.  She continues to be on 4-5 L nasal cannula.  She says that a week or 2 before admission she had a roommate who had a dog and she cleaned the dirty room.  She believes this may have triggered her current illness.  Here she is on IV Solu-Medrol Singulair Breo and Incruse oxygen.  Noticed that she is also on her carvedilol for atrial fibrillation and hypertension.  According to the hospitalist she is not better but according to the patient she is some better.  Last echocardiogram January 2023.   Past Medical History:    has a past medical history of Acanthosis nigricans, Anxiety, Arthritis, Asthma, Back pain,  Chronic diastolic CHF (congestive heart failure) ([place]) (01/17/2017), COPD (chronic obstructive pulmonary disease) ([place]), Depression, GERD (gastroesophageal reflux disease), GLOVFIEP(329.5), Helicobacter pylori (H. pylori) infection, Hypertension, essential, Insomnia, Joint pain, Lower extremity edema, Menorrhagia, Morbid obesity ([place]), OSA on CPAP, Pneumonia (X 1), Prediabetes, Rheumatoid arthritis ([place]), Seasonal allergies, Shortness of breath, Tobacco user, and Vitamin D deficiency.   reports that she quit smoking about 7 years ago. Her smoking use included cigarettes. She has a 13.00 pack-year smoking history. She has never used smokeless tobacco.  Past Surgical History:  Procedure Laterality Date   CARDIOVERSION N/A 05/30/2017   Procedure: CARDIOVERSION;  Surgeon: Sanda Klein, MD;  Location: [facility];  Service: Cardiovascular;  Laterality: N/A;   REDUCTION MAMMAPLASTY Bilateral 09/2011   TUBAL LIGATION  1996   bilateral    Allergies  Allergen Reactions   Contrast Media [Iodinated Contrast Media] Itching    Ct contrast    Immunization History  Administered Date(s) Administered   Influenza Split 01/08/2012   Influenza Whole 03/03/2009, 12/27/2009, 12/19/2010   Influenza, High Dose Seasonal PF 01/12/2019   Influenza,inj,Quad PF,6+ Mos 12/29/2012, 12/31/2013, 03/07/2015, 01/23/2016, 01/06/2017, 01/11/2020, 02/07/2021   PFIZER(Purple Top)SARS-COV-2 Vaccination 03/29/2020, 04/29/2020   PNEUMOCOCCAL CONJUGATE-20 02/20/2021   PPD Test 09/25/2011, 07/17/2012, 06/25/2013, 05/21/2016, 11/11/2017   Pneumococcal Polysaccharide-23 03/17/2009, 07/06/2016, 10/20/2016   Tdap 12/19/2010    Family History  Problem Relation Age of Onset   Hypertension Mother    Asthma Daughter  Cancer Paternal Aunt    Asthma Maternal Grandmother      Current Facility-Administered Medications:    0.9 %  sodium chloride infusion, 250 mL, Intravenous, PRN, Regalado, Belkys A, MD, Stopped at 10/10/21  0836   acetaminophen (TYLENOL) tablet 650 mg, 650 mg, Oral, Q6H PRN, 650 mg at 10/13/21 1449 **OR** acetaminophen (TYLENOL) suppository 650 mg, 650 mg, Rectal, Q6H PRN, Regalado, Belkys A, MD   albuterol (PROVENTIL) (2.5 MG/3ML) 0.083% nebulizer solution 2.5 mg, 2.5 mg, Nebulization, Q4H WA, Regalado, Belkys A, MD, 2.5 mg at 10/13/21 1604   alum & mag hydroxide-simeth (MAALOX/MYLANTA) 200-200-20 MG/5ML suspension 30 mL, 30 mL, Oral, Q4H PRN, Regalado, Belkys A, MD   amLODipine (NORVASC) tablet 10 mg, 10 mg, Oral, Daily, Regalado, Belkys A, MD, 10 mg at 10/13/21 0929   apixaban (ELIQUIS) tablet 5 mg, 5 mg, Oral, BID, Regalado, Belkys A, MD, 5 mg at 10/13/21 0930   atorvastatin (LIPITOR) tablet 20 mg, 20 mg, Oral, q morning, Regalado, Belkys A, MD, 20 mg at 10/13/21 0930   azithromycin (ZITHROMAX) tablet 500 mg, 500 mg, Oral, Daily, Regalado, Belkys A, MD, 500 mg at 10/13/21 0930   carvedilol (COREG) tablet 6.25 mg, 6.25 mg, Oral, BID WC, Regalado, Belkys A, MD, 6.25 mg at 10/13/21 0930   Chlorhexidine Gluconate Cloth 2 % PADS 6 each, 6 each, Topical, Daily, Regalado, Belkys A, MD, 6 each at 10/13/21 0931   clonazePAM (KLONOPIN) tablet 0.5 mg, 0.5 mg, Oral, BID PRN, Regalado, Belkys A, MD, 0.5 mg at 10/13/21 1446   cloNIDine (CATAPRES) tablet 0.2 mg, 0.2 mg, Oral, BID, Regalado, Belkys A, MD, 0.2 mg at 10/13/21 0930   dextromethorphan-guaiFENesin (MUCINEX DM) 30-600 MG per 12 hr tablet 1 tablet, 1 tablet, Oral, BID, Regalado, Belkys A, MD, 1 tablet at 10/13/21 0930   DULoxetine (CYMBALTA) DR capsule 60 mg, 60 mg, Oral, Daily, Regalado, Belkys A, MD, 60 mg at 10/13/21 0930   fluticasone furoate-vilanterol (BREO ELLIPTA) 100-25 MCG/ACT 1 puff, 1 puff, Inhalation, Daily, 1 puff at 10/13/21 0849 **AND** umeclidinium bromide (INCRUSE ELLIPTA) 62.5 MCG/ACT 1 puff, 1 puff, Inhalation, Daily, Regalado, Belkys A, MD, 1 puff at 10/13/21 0849   furosemide (LASIX) tablet 40 mg, 40 mg, Oral, Daily, Regalado, Belkys  A, MD, 40 mg at 10/13/21 0929   hydrALAZINE (APRESOLINE) injection 10 mg, 10 mg, Intravenous, Q4H PRN, Regalado, Belkys A, MD, 10 mg at 10/13/21 0603   HYDROcodone-acetaminophen (NORCO/VICODIN) 5-325 MG per tablet 1-2 tablet, 1-2 tablet, Oral, Q4H PRN, Regalado, Belkys A, MD, 2 tablet at 10/13/21 1540   levothyroxine (SYNTHROID) tablet 50 mcg, 50 mcg, Oral, Daily, Regalado, Belkys A, MD, 50 mcg at 10/13/21 0509   MEDLINE mouth rinse, 15 mL, Mouth Rinse, BID, Regalado, Belkys A, MD, 15 mL at 10/13/21 0931   methylPREDNISolone sodium succinate (SOLU-MEDROL) 125 mg/2 mL injection 60 mg, 60 mg, Intravenous, Q12H, Regalado, Belkys A, MD, 60 mg at 10/13/21 0929   montelukast (SINGULAIR) tablet 10 mg, 10 mg, Oral, QHS, Regalado, Belkys A, MD, 10 mg at 10/12/21 2156   pantoprazole (PROTONIX) EC tablet 40 mg, 40 mg, Oral, BID, Regalado, Belkys A, MD, 40 mg at 10/13/21 0930   phosphorus (K PHOS NEUTRAL) tablet 250 mg, 250 mg, Oral, BID, Regalado, Belkys A, MD, 250 mg at 10/13/21 0929   pregabalin (LYRICA) capsule 300 mg, 300 mg, Oral, BID, Regalado, Belkys A, MD, 300 mg at 10/13/21 0929   sodium chloride flush (NS) 0.9 % injection 3 mL, 3 mL, Intravenous, Q12H, Regalado,  Belkys A, MD, 3 mL at 10/13/21 0936   sodium chloride flush (NS) 0.9 % injection 3 mL, 3 mL, Intravenous, PRN, Regalado, Belkys A, MD   zolpidem (AMBIEN) tablet 5 mg, 5 mg, Oral, QHS PRN, Regalado, Belkys A, MD, 5 mg at 10/12/21 2156     Significant Hospital Events:  10/08/2021 - admit  Objective   Blood pressure (!) 148/86, pulse 73, temperature 98.3 F (36.8 C), temperature source Axillary, resp. rate 20, height '5\' 6"'$  (1.676 m), weight (!) 154.9 kg, SpO2 100 %.        Intake/Output Summary (Last 24 hours) at 10/13/2021 1654 Last data filed at 10/13/2021 1300 Gross per 24 hour  Intake 2640 ml  Output 2100 ml  Net 540 ml   Filed Weights   10/12/21 0500 10/12/21 0621 10/13/21 0500  Weight: (!) 155.8 kg (!) 149.4 kg (!) 154.9 kg     Examination: General: Morbidly obese female lying in the bed HENT: Mallampati class IV oxygen on Lungs: Distant breath sounds no wheezing. Cardiovascular: Normal heart sounds Abdomen: Obese Extremities: No warmth no redness.  Chronic venous stasis edema Neuro: Alert but goes back to sleep consistent with sleep apnea GU: Not examined  Resolved Hospital Problem list   x  Assessment & Plan:  ASSESSMENT / PLAN:  PULMONARY  A:  Morbid obesity baseline sleep apnea  Acute on chronic hypoxemic respiratory failure secondary to asthma exacerbation - ?  Probably triggered by exposure to dog dust.  Failure to improve according to the hospitalist.  Patient subjectively better.  10/13/2021 -> clinically hard to figure out if she is baseline now she is worse given her multiple comorbidities.  P:   Continue bronchodilator regimen steroids Lasix -We will change inhaler therapy to nebulizer therapy. Would recommend changing carvedilol [nonspecific beta-blocker] in the setting of asthma.  -Hospitalist to start consult with the cardiologist. Get echocardiogram  -Based on results consider right heart catheterization. cPAP HS  Overall challenging condition to treat  SIGNATURE    Dr. Brand Males, M.D., F.C.C.P,  Pulmonary and Critical Care Medicine Staff Physician, [place] Director - Interstitial Lung Disease  Program  P[facility] [place] [facility] [place], [place], [address]  NPI Number:  NPI #[id number]  Pager: [phone], If no answer  -> Check AMION or Try [phone] Telephone (clinical office): [phone] Telephone (research): [phone]  4:54 PM 10/13/2021   10/13/2021 4:54 PM    LABS    PULMONARY Recent Labs  Lab 10/08/21 2225  PHART 7.4  PCO2ART 38  PO2ART 159*  HCO3 23.5  O2SAT 100    CBC Recent Labs  Lab 10/11/21 0535 10/12/21 0510 10/13/21 0543  HGB 11.0* 11.4* 10.9*  HCT 36.2  37.9 35.8*  WBC 21.2* 23.0* 22.1*  PLT 456* 448* 434*    COAGULATION No results for input(s): "INR" in the last 168 hours.  CARDIAC  No results for input(s): "TROPONINI" in the last 168 hours. No results for input(s): "PROBNP" in the last 168 hours.  CHEMISTRY Recent Labs  Lab 10/08/21 2009 10/09/21 0304 10/10/21 0319 10/11/21 0535 10/12/21 0510 10/13/21 0543  NA  --  136 138 139 138 137  K  --  3.5 4.2 3.5 3.6 3.9  CL  --  106 106 104 102 101  CO2  --  20* '25 27 27 30  '$ GLUCOSE  --  257* 146* 224* 244* 162*  BUN  --  $'8 14 17 'c$ 22* 22*  CREATININE  --  0.71 0.65 0.67 0.77 0.63  CALCIUM  --  8.3* 8.7* 8.4* 8.1* 7.8*  MG  --   --  2.1 2.1 2.5* 2.2  PHOS 2.3*  --  2.1* 2.3* 3.2 3.4   Estimated Creatinine Clearance: 132.4 mL/min (by C-G formula based on SCr of 0.63 mg/dL).   LIVER Recent Labs  Lab 10/09/21 0304 10/10/21 0319 10/11/21 0535 10/12/21 0510 10/13/21 0543  AST 18 12* 12* 18 15  ALT '13 13 13 24 23  '$ ALKPHOS 64 66 65 64 59  BILITOT 0.2* 0.5 0.4 0.3 0.4  PROT 7.1 6.7 6.7 6.7 6.1*  ALBUMIN 3.3* 3.1* 3.0* 3.2* 2.9*     INFECTIOUS Recent Labs  Lab 10/13/21 1151 10/13/21 1404  LATICACIDVEN 1.1 1.8     ENDOCRINE CBG (last 3)  Recent Labs    10/12/21 2136 10/13/21 0802 10/13/21 1152  GLUCAP 185* 243* 132*         IMAGING x48h  - image(s) personally visualized  -   highlighted in bold No results found.

## 2021-10-14 ENCOUNTER — Inpatient Hospital Stay (HOSPITAL_COMMUNITY): Payer: Medicare Other

## 2021-10-14 ENCOUNTER — Other Ambulatory Visit (HOSPITAL_COMMUNITY): Payer: Medicare Other

## 2021-10-14 DIAGNOSIS — J9601 Acute respiratory failure with hypoxia: Secondary | ICD-10-CM | POA: Diagnosis not present

## 2021-10-14 DIAGNOSIS — J4541 Moderate persistent asthma with (acute) exacerbation: Secondary | ICD-10-CM | POA: Diagnosis not present

## 2021-10-14 DIAGNOSIS — J4551 Severe persistent asthma with (acute) exacerbation: Secondary | ICD-10-CM | POA: Diagnosis not present

## 2021-10-14 LAB — COMPREHENSIVE METABOLIC PANEL
ALT: 32 U/L (ref 0–44)
AST: 17 U/L (ref 15–41)
Albumin: 3.1 g/dL — ABNORMAL LOW (ref 3.5–5.0)
Alkaline Phosphatase: 65 U/L (ref 38–126)
Anion gap: 7 (ref 5–15)
BUN: 24 mg/dL — ABNORMAL HIGH (ref 6–20)
CO2: 32 mmol/L (ref 22–32)
Calcium: 8.1 mg/dL — ABNORMAL LOW (ref 8.9–10.3)
Chloride: 99 mmol/L (ref 98–111)
Creatinine, Ser: 0.87 mg/dL (ref 0.44–1.00)
GFR, Estimated: >60 mL/min (ref >60)
Glucose, Bld: 170 mg/dL — ABNORMAL HIGH (ref 70–99)
Potassium: 3.9 mmol/L (ref 3.5–5.1)
Sodium: 138 mmol/L (ref 135–145)
Total Bilirubin: 0.3 mg/dL (ref 0.3–1.2)
Total Protein: 6.4 g/dL — ABNORMAL LOW (ref 6.5–8.1)

## 2021-10-14 LAB — CBC WITH DIFFERENTIAL/PLATELET
Abs Immature Granulocytes: 0.88 10*3/uL — ABNORMAL HIGH (ref 0.00–0.07)
Basophils Absolute: 0.1 10*3/uL (ref 0.0–0.1)
Basophils Relative: 0 %
Eosinophils Absolute: 0 10*3/uL (ref 0.0–0.5)
Eosinophils Relative: 0 %
HCT: 37.4 % (ref 36.0–46.0)
Hemoglobin: 11.3 g/dL — ABNORMAL LOW (ref 12.0–15.0)
Immature Granulocytes: 4 %
Lymphocytes Relative: 10 %
Lymphs Abs: 2.3 10*3/uL (ref 0.7–4.0)
MCH: 24.8 pg — ABNORMAL LOW (ref 26.0–34.0)
MCHC: 30.2 g/dL (ref 30.0–36.0)
MCV: 82 fL (ref 80.0–100.0)
Monocytes Absolute: 1.5 10*3/uL — ABNORMAL HIGH (ref 0.1–1.0)
Monocytes Relative: 7 %
Neutro Abs: 17.3 10*3/uL — ABNORMAL HIGH (ref 1.7–7.7)
Neutrophils Relative %: 79 %
Platelets: 469 10*3/uL — ABNORMAL HIGH (ref 150–400)
RBC: 4.56 MIL/uL (ref 3.87–5.11)
RDW: 18.3 % — ABNORMAL HIGH (ref 11.5–15.5)
WBC: 22 10*3/uL — ABNORMAL HIGH (ref 4.0–10.5)
nRBC: 0.1 % (ref 0.0–0.2)

## 2021-10-14 LAB — PHOSPHORUS: Phosphorus: 3.3 mg/dL (ref 2.5–4.6)

## 2021-10-14 LAB — GLUCOSE, CAPILLARY
Glucose-Capillary: 171 mg/dL — ABNORMAL HIGH (ref 70–99)
Glucose-Capillary: 174 mg/dL — ABNORMAL HIGH (ref 70–99)
Glucose-Capillary: 139 mg/dL — ABNORMAL HIGH (ref 70–99)
Glucose-Capillary: 152 mg/dL — ABNORMAL HIGH (ref 70–99)

## 2021-10-14 LAB — MAGNESIUM: Magnesium: 2.5 mg/dL — ABNORMAL HIGH (ref 1.7–2.4)

## 2021-10-14 MED ORDER — DILTIAZEM HCL ER COATED BEADS 120 MG PO CP24
120.0000 mg | ORAL_CAPSULE | Freq: Every day | ORAL | Status: DC
  Administered 2021-10-14 – 2021-10-18 (×5): 120 mg via ORAL
  Filled 2021-10-14 (×5): qty 1

## 2021-10-14 MED ORDER — ZOLPIDEM TARTRATE 10 MG PO TABS
10.0000 mg | ORAL_TABLET | Freq: Every evening | ORAL | Status: DC | PRN
Start: 2021-10-14 — End: 2021-10-18
  Administered 2021-10-14 – 2021-10-17 (×4): 10 mg via ORAL
  Filled 2021-10-14 (×4): qty 1

## 2021-10-14 NOTE — Plan of Care (Signed)

## 2021-10-14 NOTE — Progress Notes (Signed)
PROGRESS NOTE    April [name]  YYT:[id number] DOB: Jun 16, 1970 DOA: 10/08/2021 PCP: [name], MD   Brief Narrative: 51 year old with past medical history significant for Asthma, COPD, A-fib on Eliquis, CHF, GERD, obesity, diabetes type 2 hypothyroidism who presents complaining of chest pain, shortness of breath and asthma exacerbation.  She reports wheezing and using her inhalers without significant relief.  She recently completed a steroid taper on Friday.  3 days after she presented with worsening shortness of breath.   Assessment & Plan:   Active Problems:   Severe persistent asthma with acute exacerbation in adult   HTN (hypertension)   AF (paroxysmal atrial fibrillation) (HCC)   Acute respiratory failure (HCC)   Chronic heart failure with preserved ejection fraction (HFpEF) (HCC)   Hypothyroidism   Hypokalemia   Hyperlipidemia   GERD (gastroesophageal reflux disease)   Asthma exacerbation   Body mass index 49.0-59.9, adult ([place])  1-Severe persistent Asthma with acute exacerbation: Continue with IV Solu-Medrol 40 mg twice daily Continue with Singulair Breo-Ellipta and Incruse. Change to xopenex/ipratropium.  Continue with guaifenesin. Continue with flutter valve. PPI twice daily to help prevent reflux. Discussed with patient benefit of weight loss/  Continue with Azithromycin.  Had panic attack 6/16, received klonopin.  Continue with current management , Pulmonology consulted today. Plan for ECHO and depending on results could consider right hearth cath.   2-Acute on Chronic Hypoxic Respiratory Failure Use 4 L of oxygen at home Currently on 4 L, wean as tolerated  3-Hypertension: Continue with amlodipine, Catapres, , Lasix and as needed hydralazine  4-Paroxysmal A-fib: Continue with Eliquis.  Plan to discontinue Coreg. Discussed with cardiology, start Cardizem 120 mg daily.   5-Chronic diastolic heart failure: Continue with oral Lasix  GERD: Change PPI to  twice daily  Hyperlipidemia Continue Lipitor Hypothyroidism: Continue with Synthroid. Leukocytosis; Secondary to steroids. Afebrile.  Morbid obesity : Needs lifestyle modification Hypophosphatemia; Replaced.   Estimated body mass index is 55.12 kg/m as calculated from the following:   Height as of this encounter: '5\' 6"'$  (1.676 m).   Weight as of this encounter: 154.9 kg.   DVT prophylaxis: Eliquis Code Status: full code Family Communication: Care discussed with patient.  Disposition Plan:  Status is: Inpatient Remains inpatient appropriate because: management of asthma exacerbation. Transfer to med surgery     Consultants:  None  Procedures:  None  Antimicrobials:    Subjective: She is not able to sleep at night.  She feels every day she is improving more.   Objective: Vitals:   10/14/21 0500 10/14/21 0528 10/14/21 0818 10/14/21 1131  BP:  (!) 150/92    Pulse:  74    Resp:  16    Temp:  (!) 97.4 F (36.3 C)    TempSrc:  Oral    SpO2:  100% 100% 100%  Weight: (!) 154.9 kg     Height:        Intake/Output Summary (Last 24 hours) at 10/14/2021 1322 Last data filed at 10/14/2021 0800 Gross per 24 hour  Intake 838 ml  Output 1900 ml  Net -1062 ml    Filed Weights   10/12/21 0621 10/13/21 0500 10/14/21 0500  Weight: (!) 149.4 kg (!) 154.9 kg (!) 154.9 kg    Examination:  General exam: Obese Respiratory system: BL sporadic wheezing.  Cardiovascular system: S 1, S 2 RRR Gastrointestinal system: BS present, soft, nt Central nervous system: Alert, non focal.  Extremities: No edema     Data Reviewed:  I have personally reviewed following labs and imaging studies  CBC: Recent Labs  Lab 10/10/21 0319 10/11/21 0535 10/12/21 0510 10/13/21 0543 10/14/21 0535  WBC 26.1* 21.2* 23.0* 22.1* 22.0*  NEUTROABS 22.2* 17.8* 19.3* 17.3* 17.3*  HGB 11.4* 11.0* 11.4* 10.9* 11.3*  HCT 38.1 36.2 37.9 35.8* 37.4  MCV 83.6 82.5 82.0 81.5 82.0  PLT 418* 456* 448*  434* 469*    Basic Metabolic Panel: Recent Labs  Lab 10/10/21 0319 10/11/21 0535 10/12/21 0510 10/13/21 0543 10/14/21 0535  NA 138 139 138 137 138  K 4.2 3.5 3.6 3.9 3.9  CL 106 104 102 101 99  CO2 '25 27 27 30 '$ 32  GLUCOSE 146* 224* 244* 162* 170*  BUN 14 17 22* 22* 24*  CREATININE 0.65 0.67 0.77 0.63 0.87  CALCIUM 8.7* 8.4* 8.1* 7.8* 8.1*  MG 2.1 2.1 2.5* 2.2 2.5*  PHOS 2.1* 2.3* 3.2 3.4 3.3    GFR: Estimated Creatinine Clearance: 121.7 mL/min (by C-G formula based on SCr of 0.87 mg/dL). Liver Function Tests: Recent Labs  Lab 10/10/21 0319 10/11/21 0535 10/12/21 0510 10/13/21 0543 10/14/21 0535  AST 12* 12* '18 15 17  '$ ALT '13 13 24 23 '$ 32  ALKPHOS 66 65 64 59 65  BILITOT 0.5 0.4 0.3 0.4 0.3  PROT 6.7 6.7 6.7 6.1* 6.4*  ALBUMIN 3.1* 3.0* 3.2* 2.9* 3.1*    No results for input(s): "LIPASE", "AMYLASE" in the last 168 hours. No results for input(s): "AMMONIA" in the last 168 hours. Coagulation Profile: No results for input(s): "INR", "PROTIME" in the last 168 hours. Cardiac Enzymes: Recent Labs  Lab 10/08/21 2009  CKTOTAL 108    BNP (last 3 results) No results for input(s): "PROBNP" in the last 8760 hours. HbA1C: No results for input(s): "HGBA1C" in the last 72 hours. CBG: Recent Labs  Lab 10/13/21 1152 10/13/21 1651 10/13/21 2058 10/14/21 0729 10/14/21 1208  GLUCAP 132* 218* 170* 171* 174*    Lipid Profile: No results for input(s): "CHOL", "HDL", "LDLCALC", "TRIG", "CHOLHDL", "LDLDIRECT" in the last 72 hours. Thyroid Function Tests: No results for input(s): "TSH", "T4TOTAL", "FREET4", "T3FREE", "THYROIDAB" in the last 72 hours.  Anemia Panel: No results for input(s): "VITAMINB12", "FOLATE", "FERRITIN", "TIBC", "IRON", "RETICCTPCT" in the last 72 hours. Sepsis Labs: Recent Labs  Lab 10/13/21 1151 10/13/21 1404  LATICACIDVEN 1.1 1.8     Recent Results (from the past 240 hour(s))  SARS Coronavirus 2 by RT PCR (hospital order, performed in  [facility] hospital lab) *cepheid single result test* Anterior Nasal Swab     Status: None   Collection Time: 10/08/21 10:22 PM   Specimen: Anterior Nasal Swab  Result Value Ref Range Status   SARS Coronavirus 2 by RT PCR NEGATIVE NEGATIVE Final    Comment: (NOTE) SARS-CoV-2 target nucleic acids are NOT DETECTED.  The SARS-CoV-2 RNA is generally detectable in upper and lower respiratory specimens during the acute phase of infection. The lowest concentration of SARS-CoV-2 viral copies this assay can detect is 250 copies / mL. A negative result does not preclude SARS-CoV-2 infection and should not be used as the sole basis for treatment or other patient management decisions.  A negative result may occur with improper specimen collection / handling, submission of specimen other than nasopharyngeal swab, presence of viral mutation(s) within the areas targeted by this assay, and inadequate number of viral copies (<250 copies / mL). A negative result must be combined with clinical observations, patient history, and epidemiological information.  Fact Sheet  for Patients:   https://www.patel.info/  Fact Sheet for Healthcare Providers: https://hall.com/  This test is not yet approved or  cleared by the [place] FDA and has been authorized for detection and/or diagnosis of SARS-CoV-2 by FDA under an Emergency Use Authorization (EUA).  This EUA will remain in effect (meaning this test can be used) for the duration of the COVID-19 declaration under Section 564(b)(1) of the Act, 21 U.S.C. section 360bbb-3(b)(1), unless the authorization is terminated or revoked sooner.  Performed at [facility], [place] [address] [place], [place] [address]   Respiratory (~20 pathogens) panel by PCR     Status: None   Collection Time: 10/09/21  3:02 AM   Specimen: Nasopharyngeal Swab; Respiratory  Result Value Ref Range Status   Adenovirus  NOT DETECTED NOT DETECTED Final   Coronavirus 229E NOT DETECTED NOT DETECTED Final    Comment: (NOTE) The Coronavirus on the Respiratory Panel, DOES NOT test for the novel  Coronavirus (2019 nCoV)    Coronavirus HKU1 NOT DETECTED NOT DETECTED Final   Coronavirus NL63 NOT DETECTED NOT DETECTED Final   Coronavirus OC43 NOT DETECTED NOT DETECTED Final   Metapneumovirus NOT DETECTED NOT DETECTED Final   Rhinovirus / Enterovirus NOT DETECTED NOT DETECTED Final   Influenza A NOT DETECTED NOT DETECTED Final   Influenza B NOT DETECTED NOT DETECTED Final   Parainfluenza Virus 1 NOT DETECTED NOT DETECTED Final   Parainfluenza Virus 2 NOT DETECTED NOT DETECTED Final   Parainfluenza Virus 3 NOT DETECTED NOT DETECTED Final   Parainfluenza Virus 4 NOT DETECTED NOT DETECTED Final   Respiratory Syncytial Virus NOT DETECTED NOT DETECTED Final   Bordetella pertussis NOT DETECTED NOT DETECTED Final   Bordetella Parapertussis NOT DETECTED NOT DETECTED Final   Chlamydophila pneumoniae NOT DETECTED NOT DETECTED Final   Mycoplasma pneumoniae NOT DETECTED NOT DETECTED Final    Comment: Performed at [facility], [place]. [address], [place], [place] [address]  MRSA Next Gen by PCR, Nasal     Status: None   Collection Time: 10/09/21  3:02 AM   Specimen: Nasal Mucosa; Nasal Swab  Result Value Ref Range Status   MRSA by PCR Next Gen NOT DETECTED NOT DETECTED Final    Comment: (NOTE) The GeneXpert MRSA Assay (FDA approved for NASAL specimens only), is one component of a comprehensive MRSA colonization surveillance program. It is not intended to diagnose MRSA infection nor to guide or monitor treatment for MRSA infections. Test performance is not FDA approved in patients less than 42 years old. Performed at [facility], [place] [address] [place], [place] [address]          Radiology Studies: No results found.      Scheduled Meds:  amLODipine  10 mg Oral Daily   apixaban  5  mg Oral BID   atorvastatin  20 mg Oral q morning   azithromycin  500 mg Oral Daily   carvedilol  6.25 mg Oral BID WC   Chlorhexidine Gluconate Cloth  6 each Topical Daily   cloNIDine  0.2 mg Oral BID   dextromethorphan-guaiFENesin  1 tablet Oral BID   DULoxetine  60 mg Oral Daily   furosemide  40 mg Oral Daily   ipratropium  0.5 mg Nebulization Q4H   levalbuterol  0.63 mg Nebulization Q4H   levothyroxine  50 mcg Oral Daily   mouth rinse  15 mL Mouth Rinse BID   methylPREDNISolone (SOLU-MEDROL) injection  60 mg Intravenous Q12H  montelukast  10 mg Oral QHS   pantoprazole  40 mg Oral BID   phosphorus  250 mg Oral BID   pregabalin  300 mg Oral BID   sodium chloride flush  3 mL Intravenous Q12H   Continuous Infusions:  sodium chloride Stopped (10/10/21 0836)     LOS: 6 days    Time spent: 35 minutes    Josetta Wigal A [place] Kilcrease, MD [facility]   If 7PM-7AM, please contact night-coverage [email or web address]  10/14/2021, 1:22 PM

## 2021-10-14 NOTE — Progress Notes (Signed)
SATURATION QUALIFICATIONS: (This note is used to comply with regulatory documentation for home oxygen)  Patient Saturations on Room Air at Rest = 100%  Patient Saturations on Room Air while Ambulating = 98%  Patient Saturations on 0 Liters of oxygen while Ambulating = 100%  Pt was able to ambulate 100 ft on room air. The pts O2 saturation remained between 98% and 100% on room air.

## 2021-10-14 NOTE — Consult Note (Addendum)
NAME:  April [name], MRN:  [id number], DOB:  1970/12/06, LOS: 6 ADMISSION DATE:  10/08/2021, CONSULTATION DATE:  10/13/21 REFERRING MD:  Dr Marjo Bicker, CHIEF COMPLAINT:  AE-asthma failure to improved   BRIEF  51 year old morbidly obese female on 4 L of oxygen on account of obesity sleep apnea and also severe asthma.  She uses oxygen continuously at night and intermittently based on subjective needs in the daytime follows with Dr. Baird Lyons.  In 2019 she is status post tracheostomy at [facility].  She is also noted to be on Dupixent.  History is complicated by noncompliance.  She is also on biologic test prior Singulair.  She has other medical problems.  Multiple hospitalizations including most recent May 2023.  This was for asthma and acute on chronic diastolic dysfunction.  She has hypertension noticed to be on multiple medications including amlodipine Coreg and clonidine.  She is sleep apnea for which she uses CPAP.  She has morbid obesity with a BMI of 53.  Anxiety for which she is on Klonopin.  She has chronic pain as well.  She is on Eliquis for atrial fibrillation  She was admitted 10/08/2021 with shortness of breath and wheezing and chest discomfort.  No fevers or chills.  We will call because of failure to improve she is actually states she is somewhat better.  She continues to be on 4-5 L nasal cannula.  She says that a week or 2 before admission she had a roommate who had a dog and she cleaned the dirty room.  She believes this may have triggered her current illness.  Here she is on IV Solu-Medrol Singulair Breo and Incruse oxygen.  Noticed that she is also on her carvedilol for atrial fibrillation and hypertension.  According to the hospitalist she is not better but according to the patient she is some better.  Last echocardiogram January 2023.   Past Medical History:    has a past medical history of Acanthosis nigricans, Anxiety, Arthritis, Asthma, Back pain, Chronic diastolic CHF  (congestive heart failure) ([place]) (01/17/2017), COPD (chronic obstructive pulmonary disease) ([place]), Depression, GERD (gastroesophageal reflux disease), LNLGXQJJ(941.7), Helicobacter pylori (H. pylori) infection, Hypertension, essential, Insomnia, Joint pain, Lower extremity edema, Menorrhagia, Morbid obesity ([place]), OSA on CPAP, Pneumonia (X 1), Prediabetes, Rheumatoid arthritis ([place]), Seasonal allergies, Shortness of breath, Tobacco user, and Vitamin D deficiency.   has a past surgical history that includes Tubal ligation (1996); Reduction mammaplasty (Bilateral, 09/2011); and Cardioversion (N/A, 05/30/2017).    Significant Hospital Events:  10/08/2021 - admit 6/17 - ccm consult   SUBJECTIVE/OVERNIGHT/INTERVAL HX   6/18 - better byut not well enough to go home she says. Feels 7 of 10 if 10 is her baseline. REmoved her o2 5L [place]. Currently 100% - room air at rest.    Objective   Blood pressure 118/62, pulse 74, temperature 98.1 F (36.7 C), temperature source Oral, resp. rate (!) 22, height '5\' 6"'$  (1.676 m), weight (!) 154.9 kg, SpO2 98 %.        Intake/Output Summary (Last 24 hours) at 10/14/2021 1601 Last data filed at 10/14/2021 0800 Gross per 24 hour  Intake 838 ml  Output 1900 ml  Net -1062 ml   Filed Weights   10/12/21 0621 10/13/21 0500 10/14/21 0500  Weight: (!) 149.4 kg (!) 154.9 kg (!) 154.9 kg    Examination: General: Morbidly obese female lying in the bed. Pulse ox 100% RA at rest HENT: Mallampati class IV oxygen on Lungs: Distant breath  sounds . Maybe some wheezing Cardiovascular: Normal heart sounds Abdomen: Obese Extremities: No warmth no redness.  Chronic venous stasis edema Neuro: Alert but goes back to sleep consistent with sleep apnea GU: Not examined  Resolved Hospital Problem list   x  Assessment & Plan:    PULMONARY  A:  Morbid obesity baseline sleep apnea  - does not use cPAP, uses only 5L [name] at home  Baseline severe persistent asthma - follows with  Dr Annamaria Boots  - elevated IgE  262 in 2017 (normal 2018)  - Positive RAST profile 2018 to dust mite, roach, aspergilly   - follows Dr Annamaria Boots on 1-2 concurrent biologics   Acute on chronic hypoxemic respiratory failure secondary to asthma exacerbation  - ?  Probably triggered by exposure to dog dust.  Failure to improve according to the hospitalist at time of consult 6/16 thougght patient felt better\r.  10/14/2021 -> clinically hard to figure out if she is baseline now she is worse given her multiple comorbidities. She feels 70% of baseline. Pulse here 100% RA day time at rest. No hupercapnia  P:   Get HRCT supine and prome - rule out ABPA  or other abnormalities Do walking desat test on room air Do UDS AWait ECHO - - ontinue bronchodilator regimen steroids Lasix -We will change inhaler therapy to nebulizer therapy. Would recommend changing carvedilol [nonspecific beta-blocker] in the setting of asthma.  -Hospitalist to start consult with the cardiologist.    Mild Coronary artercy calcification on CT chest JAn 2023  Plan  - OPD cards eval. Might need stress test   SIGNATURE    Dr. Brand Males, M.D., F.C.C.P,  Pulmonary and Critical Care Medicine Staff Physician, [place] Director - Interstitial Lung Disease  Program  [facility] [place], [place], [address]  NPI Number:  NPI #[id number]  Pager: [phone], If no answer  -> Check AMION or Try [phone] Telephone (clinical office): [phone] Telephone (research): [phone]  4:01 PM 10/14/2021   10/14/2021 4:01 PM    LABS    PULMONARY Recent Labs  Lab 10/08/21 2225  PHART 7.4  PCO2ART 38  PO2ART 159*  HCO3 23.5  O2SAT 100    CBC Recent Labs  Lab 10/12/21 0510 10/13/21 0543 10/14/21 0535  HGB 11.4* 10.9* 11.3*  HCT 37.9 35.8* 37.4  WBC 23.0* 22.1* 22.0*  PLT 448* 434* 469*    COAGULATION No results for input(s):  "INR" in the last 168 hours.  CARDIAC  No results for input(s): "TROPONINI" in the last 168 hours. No results for input(s): "PROBNP" in the last 168 hours.  CHEMISTRY Recent Labs  Lab 10/10/21 0319 10/11/21 0535 10/12/21 0510 10/13/21 0543 10/14/21 0535  NA 138 139 138 137 138  K 4.2 3.5 3.6 3.9 3.9  CL 106 104 102 101 99  CO2 '25 27 27 30 '$ 32  GLUCOSE 146* 224* 244* 162* 170*  BUN 14 17 22* 22* 24*  CREATININE 0.65 0.67 0.77 0.63 0.87  CALCIUM 8.7* 8.4* 8.1* 7.8* 8.1*  MG 2.1 2.1 2.5* 2.2 2.5*  PHOS 2.1* 2.3* 3.2 3.4 3.3   Estimated Creatinine Clearance: 121.7 mL/min (by C-G formula based on SCr of 0.87 mg/dL).   LIVER Recent Labs  Lab 10/10/21 0319 10/11/21 0535 10/12/21 0510 10/13/21 0543 10/14/21 0535  AST 12* 12* '18 15 17  '$ ALT '13 13 24 23 '$ 32  ALKPHOS 66 65 64 59  65  BILITOT 0.5 0.4 0.3 0.4 0.3  PROT 6.7 6.7 6.7 6.1* 6.4*  ALBUMIN 3.1* 3.0* 3.2* 2.9* 3.1*     INFECTIOUS Recent Labs  Lab 10/13/21 1151 10/13/21 1404  LATICACIDVEN 1.1 1.8     ENDOCRINE CBG (last 3)  Recent Labs    10/13/21 2058 10/14/21 0729 10/14/21 1208  GLUCAP 170* 171* 174*         IMAGING x48h  - image(s) personally visualized  -   highlighted in bold No results found.

## 2021-10-15 ENCOUNTER — Inpatient Hospital Stay (HOSPITAL_COMMUNITY): Payer: Medicare Other

## 2021-10-15 DIAGNOSIS — J9621 Acute and chronic respiratory failure with hypoxia: Secondary | ICD-10-CM | POA: Diagnosis not present

## 2021-10-15 DIAGNOSIS — I5032 Chronic diastolic (congestive) heart failure: Secondary | ICD-10-CM

## 2021-10-15 DIAGNOSIS — I5033 Acute on chronic diastolic (congestive) heart failure: Secondary | ICD-10-CM

## 2021-10-15 DIAGNOSIS — I480 Paroxysmal atrial fibrillation: Secondary | ICD-10-CM | POA: Diagnosis not present

## 2021-10-15 DIAGNOSIS — R0600 Dyspnea, unspecified: Secondary | ICD-10-CM | POA: Diagnosis not present

## 2021-10-15 DIAGNOSIS — J9601 Acute respiratory failure with hypoxia: Secondary | ICD-10-CM | POA: Diagnosis not present

## 2021-10-15 DIAGNOSIS — J4541 Moderate persistent asthma with (acute) exacerbation: Secondary | ICD-10-CM | POA: Diagnosis not present

## 2021-10-15 LAB — ECHOCARDIOGRAM COMPLETE
AR max vel: 3.23 cm2
AV Area VTI: 3.19 cm2
AV Area mean vel: 3.21 cm2
AV Mean grad: 6 mmHg
AV Peak grad: 11.7 mmHg
Ao pk vel: 1.71 m/s
Area-P 1/2: 3.6 cm2
Height: 66 in
S' Lateral: 3.6 cm
Weight: 5488 oz

## 2021-10-15 LAB — CBC WITH DIFFERENTIAL/PLATELET
Abs Immature Granulocytes: 0.42 10*3/uL — ABNORMAL HIGH (ref 0.00–0.07)
Basophils Absolute: 0.1 10*3/uL (ref 0.0–0.1)
Basophils Relative: 0 %
Eosinophils Absolute: 0 10*3/uL (ref 0.0–0.5)
Eosinophils Relative: 0 %
HCT: 35.2 % — ABNORMAL LOW (ref 36.0–46.0)
Hemoglobin: 10.5 g/dL — ABNORMAL LOW (ref 12.0–15.0)
Immature Granulocytes: 2 %
Lymphocytes Relative: 8 %
Lymphs Abs: 1.6 10*3/uL (ref 0.7–4.0)
MCH: 24.8 pg — ABNORMAL LOW (ref 26.0–34.0)
MCHC: 29.8 g/dL — ABNORMAL LOW (ref 30.0–36.0)
MCV: 83 fL (ref 80.0–100.0)
Monocytes Absolute: 1.1 10*3/uL — ABNORMAL HIGH (ref 0.1–1.0)
Monocytes Relative: 5 %
Neutro Abs: 16.8 10*3/uL — ABNORMAL HIGH (ref 1.7–7.7)
Neutrophils Relative %: 85 %
Platelets: 415 10*3/uL — ABNORMAL HIGH (ref 150–400)
RBC: 4.24 MIL/uL (ref 3.87–5.11)
RDW: 18.4 % — ABNORMAL HIGH (ref 11.5–15.5)
WBC: 19.9 10*3/uL — ABNORMAL HIGH (ref 4.0–10.5)
nRBC: 0 % (ref 0.0–0.2)

## 2021-10-15 LAB — BASIC METABOLIC PANEL
Anion gap: 10 (ref 5–15)
BUN: 30 mg/dL — ABNORMAL HIGH (ref 6–20)
CO2: 28 mmol/L (ref 22–32)
Calcium: 7.7 mg/dL — ABNORMAL LOW (ref 8.9–10.3)
Chloride: 98 mmol/L (ref 98–111)
Creatinine, Ser: 0.98 mg/dL (ref 0.44–1.00)
GFR, Estimated: >60 mL/min (ref >60)
Glucose, Bld: 290 mg/dL — ABNORMAL HIGH (ref 70–99)
Potassium: 4 mmol/L (ref 3.5–5.1)
Sodium: 136 mmol/L (ref 135–145)

## 2021-10-15 LAB — GLUCOSE, CAPILLARY
Glucose-Capillary: 199 mg/dL — ABNORMAL HIGH (ref 70–99)
Glucose-Capillary: 221 mg/dL — ABNORMAL HIGH (ref 70–99)
Glucose-Capillary: 256 mg/dL — ABNORMAL HIGH (ref 70–99)
Glucose-Capillary: 210 mg/dL — ABNORMAL HIGH (ref 70–99)

## 2021-10-15 LAB — PHOSPHORUS: Phosphorus: 3.3 mg/dL (ref 2.5–4.6)

## 2021-10-15 LAB — MAGNESIUM: Magnesium: 2.1 mg/dL (ref 1.7–2.4)

## 2021-10-15 MED ORDER — TOPIRAMATE 100 MG PO TABS
200.0000 mg | ORAL_TABLET | Freq: Every day | ORAL | Status: DC
  Administered 2021-10-15 – 2021-10-17 (×3): 200 mg via ORAL
  Filled 2021-10-15 (×3): qty 2

## 2021-10-15 MED ORDER — ARFORMOTEROL TARTRATE 15 MCG/2ML IN NEBU
15.0000 ug | INHALATION_SOLUTION | Freq: Two times a day (BID) | RESPIRATORY_TRACT | Status: DC
  Administered 2021-10-15 – 2021-10-18 (×6): 15 ug via RESPIRATORY_TRACT
  Filled 2021-10-15 (×6): qty 2

## 2021-10-15 MED ORDER — LORATADINE 10 MG PO TABS
10.0000 mg | ORAL_TABLET | Freq: Every day | ORAL | Status: DC
  Administered 2021-10-15 – 2021-10-18 (×4): 10 mg via ORAL
  Filled 2021-10-15 (×4): qty 1

## 2021-10-15 MED ORDER — REVEFENACIN 175 MCG/3ML IN SOLN
175.0000 ug | Freq: Every day | RESPIRATORY_TRACT | Status: DC
Start: 2021-10-15 — End: 2021-10-18
  Administered 2021-10-15 – 2021-10-18 (×4): 175 ug via RESPIRATORY_TRACT
  Filled 2021-10-15 (×7): qty 3

## 2021-10-15 MED ORDER — FUROSEMIDE 10 MG/ML IJ SOLN
40.0000 mg | Freq: Once | INTRAMUSCULAR | Status: AC
Start: 2021-10-15 — End: 2021-10-15
  Administered 2021-10-15: 40 mg via INTRAVENOUS
  Filled 2021-10-15: qty 4

## 2021-10-15 NOTE — Progress Notes (Signed)
NAME:  April [name], MRN:  [id number], DOB:  [birth year], LOS: 7 ADMISSION DATE:  10/08/2021, CONSULTATION DATE:  10/13/21 REFERRING MD:  Dr Marjo Bicker, CHIEF COMPLAINT:  AE-asthma failure to improved   BRIEF  51 year old morbidly obese female on 4 L of oxygen on account of obesity sleep apnea and also severe asthma.  She uses oxygen continuously at night and intermittently based on subjective needs in the daytime follows with Dr. Baird Lyons.  In 2019 she is status post tracheostomy at [facility].  She is also noted to be on Dupixent.  History is complicated by noncompliance.  She is also on biologic test prior Singulair.  She has other medical problems.  Multiple hospitalizations including most recent May 2023.  This was for asthma and acute on chronic diastolic dysfunction.  She has hypertension noticed to be on multiple medications including amlodipine Coreg and clonidine.  She is sleep apnea for which she uses CPAP.  She has morbid obesity with a BMI of 53.  Anxiety for which she is on Klonopin.  She has chronic pain as well.  She is on Eliquis for atrial fibrillation  She was admitted 10/08/2021 with shortness of breath and wheezing and chest discomfort.  No fevers or chills.  We will call because of failure to improve she is actually states she is somewhat better.  She continues to be on 4-5 L nasal cannula.  She says that a week or 2 before admission she had a roommate who had a dog and she cleaned the dirty room.  She believes this may have triggered her current illness.  Here she is on IV Solu-Medrol Singulair Breo and Incruse oxygen.  Noticed that she is also on her carvedilol for atrial fibrillation and hypertension.  According to the hospitalist she is not better but according to the patient she is some better.  Last echocardiogram January 2023.   Past Medical History:    has a past medical history of Acanthosis nigricans, Anxiety, Arthritis, Asthma, Back pain, Chronic diastolic CHF  (congestive heart failure) ([place]) (01/17/2017), COPD (chronic obstructive pulmonary disease) ([place]), Depression, GERD (gastroesophageal reflux disease), SPQZRAQT(622.6), Helicobacter pylori (H. pylori) infection, Hypertension, essential, Insomnia, Joint pain, Lower extremity edema, Menorrhagia, Morbid obesity ([place]), OSA on CPAP, Pneumonia (X 1), Prediabetes, Rheumatoid arthritis ([place]), Seasonal allergies, Shortness of breath, Tobacco user, and Vitamin D deficiency.   has a past surgical history that includes Tubal ligation (1996); Reduction mammaplasty (Bilateral, 09/2011); and Cardioversion (N/A, 05/30/2017).    Significant Hospital Events:  10/08/2021 - admit 6/17 - ccm consult  SUBJECTIVE/OVERNIGHT/INTERVAL HX  Overall she still feels around 7 out of 10; all she feels improved since admission.   Objective   Blood pressure (!) 158/82, pulse 77, temperature 98.1 F (36.7 C), temperature source Oral, resp. rate 20, height '5\' 6"'$  (1.676 m), weight (!) 155.6 kg, SpO2 100 %.        Intake/Output Summary (Last 24 hours) at 10/15/2021 1447 Last data filed at 10/15/2021 0745 Gross per 24 hour  Intake 480 ml  Output 503 ml  Net -23 ml    Filed Weights   10/13/21 0500 10/14/21 0500 10/15/21 0500  Weight: (!) 154.9 kg (!) 154.9 kg (!) 155.6 kg    Examination: General: Chronically ill-appearing woman lying in bed watching TV in no acute distress HENT: [place]/AT, eyes anicteric Lungs: Distant breath sounds, CTA on the right, wheezing on the left Cardiovascular: S1-S2, regular rate and rhythm Abdomen: Obese, soft Extremities: Mild pretibial edema, no  cyanosis Neuro: Awake and alert, answering questions appropriately Derm: Warm, dry, no diffuse rashes  HRCT chest personally reviewed-no obstructing left-sided airway lesions to explain unilateral wheezing, no infiltrates.  Mild basilar linear scars.  IgE 354  Resolved Hospital Problem list     Assessment & Plan:    Acute asthma exacerbation;  severe persistent allergicasthma Acute on chronic respiratory failure with hypoxia Morbid obesity baseline sleep apnea No CT evidence to support ABPA; IgE~350 - does not use cPAP, uses only 5L [place] at home -Supplemental oxygen as required to maintain SpO2 greater than 88%; wean as able - Continue Solu-Medrol - Switch from short acting bronchodilators to [place] -Continue montelukast  -Add Claritin -Needs ambulatory desaturation screening prior to discharge -Agree with switch to diltiazem for heart rate control from nonselective beta-blocker. -Continue Mucinex  HFpEF HTN   Mild Coronary artercy calcification on CT chest Jan 2023 -Lasix  -Appreciate cardiology's input  Rest of care per primary.  We will follow-up later this week to monitor her progress.  In the interim please call with questions.  LABS    PULMONARY Recent Labs  Lab 10/08/21 2225  PHART 7.4  PCO2ART 38  PO2ART 159*  HCO3 23.5  O2SAT 100     CBC Recent Labs  Lab 10/13/21 0543 10/14/21 0535 10/15/21 0437  HGB 10.9* 11.3* 10.5*  HCT 35.8* 37.4 35.2*  WBC 22.1* 22.0* 19.9*  PLT 434* 469* 415*     Julian Hy, DO 10/15/21 4:24 PM [facility]

## 2021-10-15 NOTE — Care Management Important Message (Signed)
Important Message  Patient Details IM Letter given to the Patient. Name: KARMELLA BOUVIER MRN: [id number] Date of Birth: [birth year]   Medicare Important Message Given:  Yes     Kerin Salen 10/15/2021, 11:48 AM

## 2021-10-15 NOTE — Progress Notes (Signed)
Echocardiogram 2D Echocardiogram has been performed.  April [name] 10/15/2021, 12:20 PM

## 2021-10-15 NOTE — Progress Notes (Signed)
PROGRESS NOTE    April [name]  NGE:[id number] DOB: Oct 22, 1970 DOA: 10/08/2021 PCP: [name], MD   Brief Narrative: 51 year old with past medical history significant for Asthma, COPD, A-fib on Eliquis, CHF, GERD, obesity, diabetes type 2 hypothyroidism who presents complaining of chest pain, shortness of breath and asthma exacerbation.  She reports wheezing and using her inhalers without significant relief.  She recently completed a steroid taper on Friday.  3 days after she presented with worsening shortness of breath.   Assessment & Plan:   Active Problems:   Severe persistent asthma with acute exacerbation in adult   HTN (hypertension)   AF (paroxysmal atrial fibrillation) (HCC)   Acute respiratory failure (HCC)   Chronic heart failure with preserved ejection fraction (HFpEF) (HCC)   Hypothyroidism   Hypokalemia   Hyperlipidemia   GERD (gastroesophageal reflux disease)   Asthma exacerbation   Body mass index 50.0-59.9, adult ([place])  1-Severe persistent Asthma with acute exacerbation: Continue with IV Solu-Medrol 40 mg twice daily Continue with Singulair Breo-Ellipta and Incruse--- Change to xopenex/ipratropium 6/17--Now change to [place] and [place]per[place] 6/19 Continue with guaifenesin. Continue with flutter valve. PPI twice daily to help prevent reflux. Discussed with patient benefit of weight loss/  Completed  Azithromycin, 5 days.  Had panic attack 6/16, received klonopin.  Continue with current management , Pulmonology consulted. Changes nebulizer.  ECHO stable, Ef 60-65 %   2-Acute on Chronic Hypoxic Respiratory Failure Use 4 L of oxygen at home Currently on 4 L, wean as tolerated Need oxygen on ambulation  Plan to continue with diuresis. Could consider heart cath after further diuresis ,if volume status difficult to assess.   3-Hypertension: Continue with amlodipine, Catapres, , Lasix and as needed hydralazine  4-Paroxysmal A-fib: Continue with Eliquis.  Plan to  discontinue Coreg. Discussed with cardiology, start Cardizem 120 mg daily.   5-Chronic diastolic heart failure: Continue with oral Lasix CAD calcification on CT. Cardiology following.  Atypical chest pain;  If she required right heart cath, could proceed with left heart cath per cardiology. Potential cardiac PET scan could be performed out patient.  One time dose IV lasix ordered today.   GERD: Change PPI to twice daily  Hyperlipidemia Continue Lipitor Hypothyroidism: Continue with Synthroid. Leukocytosis; Secondary to steroids. Afebrile.  Morbid obesity : Needs lifestyle modification Hypophosphatemia; Replaced.   Estimated body mass index is 55.36 kg/m as calculated from the following:   Height as of this encounter: '5\' 6"'$  (1.676 m).   Weight as of this encounter: 155.6 kg.   DVT prophylaxis: Eliquis Code Status: full code Family Communication: Care discussed with patient.  Disposition Plan:  Status is: Inpatient Remains inpatient appropriate because: management of asthma exacerbation. Transfer to med surgery     Consultants:  None  Procedures:  None  Antimicrobials:    Subjective: She is improving, not at baseline.   Objective: Vitals:   10/15/21 0500 10/15/21 0816 10/15/21 1242 10/15/21 1500  BP:   (!) 158/82   Pulse:   77   Resp:   20   Temp:   98.1 F (36.7 C)   TempSrc:   Oral   SpO2:  99% 100% 99%  Weight: (!) 155.6 kg     Height:        Intake/Output Summary (Last 24 hours) at 10/15/2021 1757 Last data filed at 10/15/2021 0745 Gross per 24 hour  Intake 480 ml  Output 503 ml  Net -23 ml    Filed Weights   10/13/21 0500  10/14/21 0500 10/15/21 0500  Weight: (!) 154.9 kg (!) 154.9 kg (!) 155.6 kg    Examination:  General exam: Obese Respiratory system: BL wheezing.  Cardiovascular system: S 1, S 2 RRR Gastrointestinal system: BS present, soft, nt Central nervous system: Alert Extremities: trace edema     Data Reviewed: I have  personally reviewed following labs and imaging studies  CBC: Recent Labs  Lab 10/11/21 0535 10/12/21 0510 10/13/21 0543 10/14/21 0535 10/15/21 0437  WBC 21.2* 23.0* 22.1* 22.0* 19.9*  NEUTROABS 17.8* 19.3* 17.3* 17.3* 16.8*  HGB 11.0* 11.4* 10.9* 11.3* 10.5*  HCT 36.2 37.9 35.8* 37.4 35.2*  MCV 82.5 82.0 81.5 82.0 83.0  PLT 456* 448* 434* 469* 415*    Basic Metabolic Panel: Recent Labs  Lab 10/11/21 0535 10/12/21 0510 10/13/21 0543 10/14/21 0535 10/15/21 0437  NA 139 138 137 138 136  K 3.5 3.6 3.9 3.9 4.0  CL 104 102 101 99 98  CO2 '27 27 30 '$ 32 28  GLUCOSE 224* 244* 162* 170* 290*  BUN 17 22* 22* 24* 30*  CREATININE 0.67 0.77 0.63 0.87 0.98  CALCIUM 8.4* 8.1* 7.8* 8.1* 7.7*  MG 2.1 2.5* 2.2 2.5* 2.1  PHOS 2.3* 3.2 3.4 3.3 3.3    GFR: Estimated Creatinine Clearance: 108.4 mL/min (by C-G formula based on SCr of 0.98 mg/dL). Liver Function Tests: Recent Labs  Lab 10/10/21 0319 10/11/21 0535 10/12/21 0510 10/13/21 0543 10/14/21 0535  AST 12* 12* '18 15 17  '$ ALT '13 13 24 23 '$ 32  ALKPHOS 66 65 64 59 65  BILITOT 0.5 0.4 0.3 0.4 0.3  PROT 6.7 6.7 6.7 6.1* 6.4*  ALBUMIN 3.1* 3.0* 3.2* 2.9* 3.1*    No results for input(s): "LIPASE", "AMYLASE" in the last 168 hours. No results for input(s): "AMMONIA" in the last 168 hours. Coagulation Profile: No results for input(s): "INR", "PROTIME" in the last 168 hours. Cardiac Enzymes: Recent Labs  Lab 10/08/21 2009  CKTOTAL 108    BNP (last 3 results) No results for input(s): "PROBNP" in the last 8760 hours. HbA1C: No results for input(s): "HGBA1C" in the last 72 hours. CBG: Recent Labs  Lab 10/14/21 1711 10/14/21 2140 10/15/21 0743 10/15/21 1229 10/15/21 1617  GLUCAP 139* 152* 199* 221* 256*    Lipid Profile: No results for input(s): "CHOL", "HDL", "LDLCALC", "TRIG", "CHOLHDL", "LDLDIRECT" in the last 72 hours. Thyroid Function Tests: No results for input(s): "TSH", "T4TOTAL", "FREET4", "T3FREE",  "THYROIDAB" in the last 72 hours.  Anemia Panel: No results for input(s): "VITAMINB12", "FOLATE", "FERRITIN", "TIBC", "IRON", "RETICCTPCT" in the last 72 hours. Sepsis Labs: Recent Labs  Lab 10/13/21 1151 10/13/21 1404  LATICACIDVEN 1.1 1.8     Recent Results (from the past 240 hour(s))  SARS Coronavirus 2 by RT PCR (hospital order, performed in [facility] hospital lab) *cepheid single result test* Anterior Nasal Swab     Status: None   Collection Time: 10/08/21 10:22 PM   Specimen: Anterior Nasal Swab  Result Value Ref Range Status   SARS Coronavirus 2 by RT PCR NEGATIVE NEGATIVE Final    Comment: (NOTE) SARS-CoV-2 target nucleic acids are NOT DETECTED.  The SARS-CoV-2 RNA is generally detectable in upper and lower respiratory specimens during the acute phase of infection. The lowest concentration of SARS-CoV-2 viral copies this assay can detect is 250 copies / mL. A negative result does not preclude SARS-CoV-2 infection and should not be used as the sole basis for treatment or other patient management decisions.  A negative  result may occur with improper specimen collection / handling, submission of specimen other than nasopharyngeal swab, presence of viral mutation(s) within the areas targeted by this assay, and inadequate number of viral copies (<250 copies / mL). A negative result must be combined with clinical observations, patient history, and epidemiological information.  Fact Sheet for Patients:   https://www.patel.info/  Fact Sheet for Healthcare Providers: https://hall.com/  This test is not yet approved or  cleared by the [place] FDA and has been authorized for detection and/or diagnosis of SARS-CoV-2 by FDA under an Emergency Use Authorization (EUA).  This EUA will remain in effect (meaning this test can be used) for the duration of the COVID-19 declaration under Section 564(b)(1) of the Act, 21 U.S.C. section  360bbb-3(b)(1), unless the authorization is terminated or revoked sooner.  Performed at [facility], [place] [address] [place], [place] [address]   Respiratory (~20 pathogens) panel by PCR     Status: None   Collection Time: 10/09/21  3:02 AM   Specimen: Nasopharyngeal Swab; Respiratory  Result Value Ref Range Status   Adenovirus NOT DETECTED NOT DETECTED Final   Coronavirus 229E NOT DETECTED NOT DETECTED Final    Comment: (NOTE) The Coronavirus on the Respiratory Panel, DOES NOT test for the novel  Coronavirus (2019 nCoV)    Coronavirus HKU1 NOT DETECTED NOT DETECTED Final   Coronavirus NL63 NOT DETECTED NOT DETECTED Final   Coronavirus OC43 NOT DETECTED NOT DETECTED Final   Metapneumovirus NOT DETECTED NOT DETECTED Final   Rhinovirus / Enterovirus NOT DETECTED NOT DETECTED Final   Influenza A NOT DETECTED NOT DETECTED Final   Influenza B NOT DETECTED NOT DETECTED Final   Parainfluenza Virus 1 NOT DETECTED NOT DETECTED Final   Parainfluenza Virus 2 NOT DETECTED NOT DETECTED Final   Parainfluenza Virus 3 NOT DETECTED NOT DETECTED Final   Parainfluenza Virus 4 NOT DETECTED NOT DETECTED Final   Respiratory Syncytial Virus NOT DETECTED NOT DETECTED Final   Bordetella pertussis NOT DETECTED NOT DETECTED Final   Bordetella Parapertussis NOT DETECTED NOT DETECTED Final   Chlamydophila pneumoniae NOT DETECTED NOT DETECTED Final   Mycoplasma pneumoniae NOT DETECTED NOT DETECTED Final    Comment: Performed at [facility], [place]. [address] [place], [place] [address]  MRSA Next Gen by PCR, Nasal     Status: None   Collection Time: 10/09/21  3:02 AM   Specimen: Nasal Mucosa; Nasal Swab  Result Value Ref Range Status   MRSA by PCR Next Gen NOT DETECTED NOT DETECTED Final    Comment: (NOTE) The GeneXpert MRSA Assay (FDA approved for NASAL specimens only), is one component of a comprehensive MRSA colonization surveillance program. It is not intended to diagnose MRSA  infection nor to guide or monitor treatment for MRSA infections. Test performance is not FDA approved in patients less than 75 years old. Performed at [facility], [place] [address] [place],  [address]          Radiology Studies: ECHOCARDIOGRAM COMPLETE  Result Date: 10/15/2021    ECHOCARDIOGRAM REPORT   Patient Name:   April [name] Date of Exam: 10/15/2021 Medical Rec #:  [id number]    Height:       66.0 in Accession #:    [id number]   Weight:       343.0 lb Date of Birth:  03-24-1971     BSA:          2.515 m Patient Age:  50 years     BP:           145/69 mmHg Patient Gender: F            HR:           78 bpm. Exam Location:  Inpatient Procedure: 2D Echo, Cardiac Doppler and Color Doppler Indications:    CHF  History:        Patient has prior history of Echocardiogram examinations, most                 recent 05/25/2021.  Sonographer:    Joette Catching RCS Referring Phys: [place]  1. Left ventricular ejection fraction, by estimation, is 60 to 65%. The left ventricle has normal function. The left ventricle has no regional wall motion abnormalities. There is moderate left ventricular hypertrophy. Left ventricular diastolic parameters are consistent with Grade I diastolic dysfunction (impaired relaxation).  2. Right ventricular systolic function is normal. The right ventricular size is normal.  3. The mitral valve is normal in structure. Trivial mitral valve regurgitation. No evidence of mitral stenosis.  4. The aortic valve is normal in structure. Aortic valve regurgitation is not visualized. No aortic stenosis is present.  5. The inferior vena cava is normal in size with greater than 50% respiratory variability, suggesting right atrial pressure of 3 mmHg. FINDINGS  Left Ventricle: Left ventricular ejection fraction, by estimation, is 60 to 65%. The left ventricle has normal function. The left ventricle has no regional wall motion abnormalities. The  left ventricular internal cavity size was normal in size. There is  moderate left ventricular hypertrophy. Left ventricular diastolic parameters are consistent with Grade I diastolic dysfunction (impaired relaxation). Right Ventricle: The right ventricular size is normal. No increase in right ventricular wall thickness. Right ventricular systolic function is normal. Left Atrium: Left atrial size was normal in size. Right Atrium: Right atrial size was normal in size. Pericardium: There is no evidence of pericardial effusion. Mitral Valve: The mitral valve is normal in structure. Trivial mitral valve regurgitation. No evidence of mitral valve stenosis. Tricuspid Valve: The tricuspid valve is normal in structure. Tricuspid valve regurgitation is not demonstrated. No evidence of tricuspid stenosis. Aortic Valve: The aortic valve is normal in structure. Aortic valve regurgitation is not visualized. No aortic stenosis is present. Aortic valve mean gradient measures 6.0 mmHg. Aortic valve peak gradient measures 11.7 mmHg. Aortic valve area, by VTI measures 3.19 cm. Pulmonic Valve: The pulmonic valve was normal in structure. Pulmonic valve regurgitation is not visualized. No evidence of pulmonic stenosis. Aorta: The aortic root is normal in size and structure. Venous: The inferior vena cava is normal in size with greater than 50% respiratory variability, suggesting right atrial pressure of 3 mmHg. IAS/Shunts: No atrial level shunt detected by color flow Doppler.  LEFT VENTRICLE PLAX 2D LVIDd:         5.30 cm   Diastology LVIDs:         3.60 cm   LV e' medial:    8.92 cm/s LV PW:         1.40 cm   LV E/e' medial:  12.2 LV IVS:        1.50 cm   LV e' lateral:   8.38 cm/s LVOT diam:     2.30 cm   LV E/e' lateral: 13.0 LV SV:         103 LV SV Index:   41 LVOT Area:     4.15  cm  RIGHT VENTRICLE             IVC RV Basal diam:  3.50 cm     IVC diam: 2.00 cm RV Mid diam:    2.60 cm RV S prime:     17.70 cm/s LEFT ATRIUM              Index        RIGHT ATRIUM           Index LA diam:        3.80 cm 1.51 cm/m   RA Area:     12.60 cm LA Vol (A2C):   43.3 ml 17.22 ml/m  RA Volume:   28.60 ml  11.37 ml/m LA Vol (A4C):   41.9 ml 16.66 ml/m LA Biplane Vol: 44.8 ml 17.81 ml/m  AORTIC VALVE                     PULMONIC VALVE AV Area (Vmax):    3.23 cm      PV Vmax:          1.57 m/s AV Area (Vmean):   3.21 cm      PV Peak grad:     9.9 mmHg AV Area (VTI):     3.19 cm      PR End Diast Vel: 3.41 msec AV Vmax:           171.00 cm/s AV Vmean:          114.000 cm/s AV VTI:            0.324 m AV Peak Grad:      11.7 mmHg AV Mean Grad:      6.0 mmHg LVOT Vmax:         133.00 cm/s LVOT Vmean:        88.100 cm/s LVOT VTI:          0.249 m LVOT/AV VTI ratio: 0.77  AORTA Ao Root diam: 3.00 cm Ao Asc diam:  2.90 cm MITRAL VALVE                TRICUSPID VALVE MV Area (PHT): 3.60 cm     TR Peak grad:   16.2 mmHg MV Decel Time: 211 msec     TR Vmax:        201.00 cm/s MV E velocity: 109.00 cm/s MV A velocity: 107.00 cm/s  SHUNTS MV E/A ratio:  1.02         Systemic VTI:  0.25 m                             Systemic Diam: 2.30 cm Glori Bickers MD Electronically signed by Glori Bickers MD Signature Date/Time: 10/15/2021/4:13:50 PM    Final    CT Chest High Resolution  Result Date: 10/14/2021 CLINICAL DATA:  Shortness of breath EXAM: CT CHEST WITHOUT CONTRAST TECHNIQUE: Multidetector CT imaging of the chest was performed following the standard protocol without intravenous contrast. High resolution imaging of the lungs, as well as inspiratory and expiratory imaging, was performed. RADIATION DOSE REDUCTION: This exam was performed according to the departmental dose-optimization program which includes automated exposure control, adjustment of the mA and/or kV according to patient size and/or use of iterative reconstruction technique. COMPARISON:  Multiple priors, most recent chest CT dated June 25th 2023; chest CT dated November 07, 2017; chest CT dated  January 27, 2017 FINDINGS: Cardiovascular: Mild cardiomegaly. No  pericardial effusion. Moderate coronary artery calcifications of the LAD. No significant atherosclerotic disease of the thoracic aorta. Mediastinum/Nodes: Thyroid and esophagus are unremarkable. No pathologically enlarged lymph nodes seen in the chest. Lungs/Pleura: Central airways are patent. Expiratory phase images on today's exam are inadequate since obtained during inspiration, however on prior CTs with expiratory phase imaging there is no evidence of air trapping. Mild centrilobular and paraseptal emphysema. Mild upper and anterior lung predominant subpleural and peribronchovascular reticular opacities with no evidence of traction bronchiectasis or honeycomb change. Findings are new when compared with January 27, 2017 exam, but improved when compared with November 07, 2017 exam. Mild focal bronchiectasis in the lingula with associated linear opacities, unchanged when compared with most recent prior exam and likely sequela of prior infection or aspiration. No consolidation, pleural effusion or pneumothorax. Upper Abdomen: No acute abnormality. Musculoskeletal: No chest wall mass or suspicious bone lesions identified. IMPRESSION: 1. No acute airspace opacity. 2. Mild upper and anterior lung predominant subpleural and peribronchovascular reticular opacities with no traction bronchiectasis or evidence of air trapping, findings are new when compared with January 27, 2017 exam, but improved when compared with November 07, 2017 exam. Findings are likely due to scarring related to prior acute lung injury. 3. Moderate coronary artery calcifications of the LAD. 4. Mild emphysema (ICD10-J43.9). Electronically Signed   By: Yetta Glassman M.D.   On: 10/14/2021 20:05        Scheduled Meds:  amLODipine  10 mg Oral Daily   apixaban  5 mg Oral BID   arformoterol  15 mcg Nebulization BID   atorvastatin  20 mg Oral q morning   Chlorhexidine Gluconate Cloth  6 each  Topical Daily   cloNIDine  0.2 mg Oral BID   dextromethorphan-guaiFENesin  1 tablet Oral BID   diltiazem  120 mg Oral Daily   DULoxetine  60 mg Oral Daily   furosemide  40 mg Intravenous Once   furosemide  40 mg Oral Daily   levothyroxine  50 mcg Oral Daily   loratadine  10 mg Oral Daily   mouth rinse  15 mL Mouth Rinse BID   methylPREDNISolone (SOLU-MEDROL) injection  60 mg Intravenous Q12H   montelukast  10 mg Oral QHS   pantoprazole  40 mg Oral BID   phosphorus  250 mg Oral BID   pregabalin  300 mg Oral BID   revefenacin  175 mcg Nebulization Daily   sodium chloride flush  3 mL Intravenous Q12H   topiramate  200 mg Oral QHS   Continuous Infusions:  sodium chloride Stopped (10/10/21 0836)     LOS: 7 days    Time spent: 35 minutes    Terrick Allred A Alder Murri, MD [facility]   If 7PM-7AM, please contact night-coverage [email or web address]  10/15/2021, 5:57 PM

## 2021-10-15 NOTE — Consult Note (Addendum)
Cardiology Consultation:   Patient ID: April [name] MRN: [id number]; DOB: Sep 16, 1970  Admit date: 10/08/2021 Date of Consult: 10/15/2021  PCP:  [name], MD   [facility] Providers Cardiologist:  Sanda Klein, MD     Patient Profile:   April [name] is a 51 y.o. female with a hx of asthma, COPD, atrial fibrillation on eliquis, diastolic CHF, GERD, obesity who is being seen 10/15/2021 for the evaluation of SOB at the request of Dr. Tyrell Antonio.  History of Present Illness:   April [name] is a 51 year old female with above medical history. Per chart review, patient was seen by [facility] in 04/2017. Patient was admitted to the hospital for evaluation of SOB. Developed new onset atrial fibrillation with RVR. Underwent DCCV on 05/30/2017. Does not appear to have followed up with cardiology since then.   It appears that patient has been admitted to the hospital 4 times this year for treatment of asthma exacerbations. When admitted in 04/2021, patient underwent an echocardiogram that showed EF 65-70%, moderate concentric LVH, normal RV systolic function, no significant valvular abnormalities.   Patient presented to the ED on 10/08/21 complaining of SOB for the past few days. Reported using her inhalers and nebs without relief. She had just finished a steroid taper and presented 3 days later. She was admitted to the hospitalist service for treatment of severe persistent asthma with acute exacerbation. Pulmonology consulted. They ordered an echocardiogram which is pending, requested cardiology consultation as patient had some mild coronary artery calcification on CT chest in 04/2021 and to consider RHC.   hsTn 7>>7. BNP 66.3. CXR showed no active cardiopulmonary disease. CT chest showed no acute airspace opacity, moderate coronary artery calcifications of the LAD, mild emphysema.   On interview, patient reports having occasional chest pain prior to admission. Pain was described as pounding.  Occurred both while at rest or on activity. Relieved when she was able to "calm herself down". Patient also reported some SOB. Believes this to be related to her asthma as she feels wheezy. Denies orthopnea. Does have occasional SOB on exertion and occasional ankle edema. Has not been taking her lasix at home. Denies tobacco, alcohol use. Denies using other types of substances.    Past Medical History:  Diagnosis Date   Acanthosis nigricans    Anxiety    Arthritis    "knees" (04/28/2017)   Asthma    Followed by Dr. Annamaria Boots (pulmonology); receives every other week omalizumab injections; has frequent exacerbations   Back pain    Chronic diastolic CHF (congestive heart failure) ([place]) 01/17/2017   COPD (chronic obstructive pulmonary disease) ([place])    PFTs in 2002, FEV1/FVC 65, no post bronchodilater test done   Depression    GERD (gastroesophageal reflux disease)    Headache(784.0)    "q couple days" (95/63/8756)   Helicobacter pylori (H. pylori) infection    Hypertension, essential    Insomnia    Joint pain    Lower extremity edema    Menorrhagia    Morbid obesity ([place])    OSA on CPAP    Sleep study 2008 - mild OSA, not enough events to titrate CPAP; wears CPAP now/pt on 04/28/2017   Pneumonia X 1   Prediabetes    Rheumatoid arthritis (HCC)    Seasonal allergies    Shortness of breath    Tobacco user    Vitamin D deficiency     Past Surgical History:  Procedure Laterality Date   CARDIOVERSION N/A 05/30/2017  Procedure: CARDIOVERSION;  Surgeon: Sanda Klein, MD;  Location: [facility];  Service: Cardiovascular;  Laterality: N/A;   REDUCTION MAMMAPLASTY Bilateral 09/2011   TUBAL LIGATION  1996   bilateral     Home Medications:  Prior to Admission medications   Medication Sig Start Date End Date Taking? Authorizing Provider  albuterol (PROAIR HFA) 108 (90 Base) MCG/ACT inhaler Inhale 2 puffs into the lungs every 6 (six) hours as needed for wheezing or shortness of breath.  06/26/21  Yes Young, Clinton D, MD  amLODipine (NORVASC) 10 MG tablet TAKE 1 TABLET(10 MG) BY MOUTH DAILY Patient taking differently: Take 10 mg by mouth daily. 08/27/21  Yes [name], MD  apixaban (ELIQUIS) 5 MG TABS tablet Take 1 tablet (5 mg total) by mouth 2 (two) times daily. 06/06/21  Yes [name], MD  atorvastatin (LIPITOR) 20 MG tablet Take 20 mg by mouth every morning. 10/02/20  Yes [provider]  baclofen (LIORESAL) 20 MG tablet Take 20 mg by mouth daily as needed for muscle spasms (pain). 01/08/21  Yes [provider]  carvedilol (COREG) 6.25 MG tablet Take 1 tablet (6.25 mg total) by mouth 2 (two) times daily with a meal. 06/06/21  Yes Newlin, Enobong, MD  clonazePAM (KLONOPIN) 0.5 MG tablet Take 1 tablet (0.5 mg total) by mouth daily as needed for anxiety. 09/13/21  Yes Young, Tarri Fuller D, MD  cloNIDine (CATAPRES) 0.2 MG tablet Take 0.2 mg by mouth 2 (two) times daily.   Yes [provider]  Diclofenac Sodium 3 % GEL Apply 1 application. topically daily as needed (pain). 08/03/21  Yes [provider]  DULoxetine (CYMBALTA) 60 MG capsule Take 1 capsule (60 mg total) by mouth daily. Patient taking differently: Take 60 mg by mouth daily as needed (for anxiety or depression). 06/06/21  Yes Newlin, Charlane Ferretti, MD  EPINEPHrine 0.3 mg/0.3 mL IJ SOAJ injection Inject 0.3 mg into the muscle as needed for anaphylaxis. 05/30/20  Yes Young, Tarri Fuller D, MD  Fluticasone-Umeclidin-Vilant (TRELEGY ELLIPTA) 100-62.5-25 MCG/INH AEPB Inhale 1 puff into the lungs daily. 01/23/21  Yes Young, Tarri Fuller D, MD  furosemide (LASIX) 40 MG tablet Take 1 tablet (40 mg total) by mouth daily. 02/23/20  Yes Newlin, Charlane Ferretti, MD  ipratropium-albuterol (DUONEB) 0.5-2.5 (3) MG/3ML SOLN Take 3 mLs by nebulization every 6 (six) hours as needed (shortness of breath/wheezing). 02/15/21  Yes Young, Tarri Fuller D, MD  levothyroxine (SYNTHROID) 50 MCG tablet Take 50 mcg by mouth daily. 10/15/20  Yes [provider]  montelukast (SINGULAIR) 10 MG tablet Take 1 tablet (10 mg total) by mouth at bedtime. 09/13/21  Yes [name], MD  naloxone (NARCAN) nasal spray 4 mg/0.1 mL Place 1 spray into the nose once as needed (opioid overdose). 08/09/20  Yes [provider]  omeprazole (PRILOSEC) 40 MG capsule Take 40 mg by mouth daily.   Yes [provider]  oxyCODONE-acetaminophen (PERCOCET) 10-325 MG tablet Take 1 tablet by mouth 5 (five) times daily as needed (nerve pain). 08/01/21  Yes [provider]  phentermine 15 MG capsule Take 15 mg by mouth daily. 08/10/21  Yes [provider]  potassium chloride SA (KLOR-CON M) 20 MEQ tablet TAKE 1 TABLET( 20 MEQ TOTAL) BY MOUTH DAILY Patient taking differently: Take 20 mEq by mouth daily. 09/27/21  Yes [name], MD  pregabalin (LYRICA) 300 MG capsule Take 300 mg by mouth 2 (two) times daily. 01/08/21  Yes [provider]  Semaglutide, 1 MG/DOSE, (OZEMPIC, 1 MG/DOSE,) 4 MG/3ML SOPN  Inject 1 mg into the skin every Monday.   Yes [provider]  tezepelumab-ekko (TEZSPIRE) 210 MG/1.91ML syringe Inject 210 mg into the skin every 28 (twenty-eight) days. 04/12/21  Yes [provider]  topiramate (TOPAMAX) 200 MG tablet Take 200 mg by mouth at bedtime. 01/08/21  Yes [provider]  zolpidem (AMBIEN) 10 MG tablet TAKE 1 TABLET BY MOUTH AT BEDTIME AS NEEDED FOR SLEEP Patient taking differently: Take 10 mg by mouth at bedtime. 07/25/21  Yes Young, Clinton D, MD  fluticasone (FLONASE) 50 MCG/ACT nasal spray SHAKE LIQUID AND USE 1 SPRAY IN EACH NOSTRIL DAILY AS NEEDED FOR ALLERGIES OR RHINITIS Patient taking differently: Place 1 spray into both nostrils daily. 08/05/21   [name], MD  Ipratropium-Albuterol (COMBIVENT) 20-100 MCG/ACT AERS respimat Inhale 1 puff, every 6 hours as needed, rescue Patient not taking: Reported on 10/08/2021 07/31/21   Deneise Lever, MD  predniSONE (DELTASONE) 10 MG  tablet Prednisone dosing: Take  Prednisone '40mg'$  (4 tabs) x 3 days, then taper to '30mg'$  (3 tabs) x 3 days, then '20mg'$  (2 tabs) x 3days, then '10mg'$  (1 tab) x 3days, then OFF. Patient not taking: Reported on 10/08/2021 09/13/21   Deneise Lever, MD  Tezepelumab-ekko (TEZSPIRE) 210 MG/1.91ML SOAJ Inject 210 mg into the skin every 28 (twenty-eight) days. Courier to pulm: [address] [place] [address], [place] [address]. Appointment on 11/05/21. 10/11/21   Deneise Lever, MD  mometasone-formoterol (DULERA) 200-5 MCG/ACT AERO Inhale 2 puffs into the lungs 2 (two) times daily. Dx: Asthma 04/08/19 08/12/19  Deneise Lever, MD    Inpatient Medications: Scheduled Meds:  amLODipine  10 mg Oral Daily   apixaban  5 mg Oral BID   atorvastatin  20 mg Oral q morning   Chlorhexidine Gluconate Cloth  6 each Topical Daily   cloNIDine  0.2 mg Oral BID   dextromethorphan-guaiFENesin  1 tablet Oral BID   diltiazem  120 mg Oral Daily   DULoxetine  60 mg Oral Daily   furosemide  40 mg Oral Daily   ipratropium  0.5 mg Nebulization Q4H   levalbuterol  0.63 mg Nebulization Q4H   levothyroxine  50 mcg Oral Daily   mouth rinse  15 mL Mouth Rinse BID   methylPREDNISolone (SOLU-MEDROL) injection  60 mg Intravenous Q12H   montelukast  10 mg Oral QHS   pantoprazole  40 mg Oral BID   phosphorus  250 mg Oral BID   pregabalin  300 mg Oral BID   sodium chloride flush  3 mL Intravenous Q12H   topiramate  200 mg Oral QHS   Continuous Infusions:  sodium chloride Stopped (10/10/21 0836)   PRN Meds: sodium chloride, acetaminophen **OR** acetaminophen, alum & mag hydroxide-simeth, clonazePAM, hydrALAZINE, HYDROcodone-acetaminophen, levalbuterol, sodium chloride flush, zolpidem  Allergies:    Allergies  Allergen Reactions   Contrast Media [Iodinated Contrast Media] Itching    Ct contrast    Social History:   Social History   Socioeconomic History   Marital status: Single    Spouse name: Not on file   Number of  children: 1   Years of education: Not on file   Highest education level: Not on file  Occupational History   Occupation: stay at home  Tobacco Use   Smoking status: Former    Packs/day: 0.50    Years: 26.00    Total pack years: 13.00    Types: Cigarettes    Quit date: 09/12/2014    Years  since quitting: 7.0   Smokeless tobacco: Never  Vaping Use   Vaping Use: Never used  Substance and Sexual Activity   Alcohol use: No   Drug use: No   Sexual activity: Never  Other Topics Concern   Not on file  Social History Narrative   Daughters are grown, not married, works as a [employer]ar[employer] manager.   Social Determinants of Health   Financial Resource Strain: Not on file  Food Insecurity: Not on file  Transportation Needs: Not on file  Physical Activity: Not on file  Stress: Not on file  Social Connections: Not on file  Intimate Partner Violence: Not on file    Family History:    Family History  Problem Relation Age of Onset   Hypertension Mother    Asthma Daughter    Cancer Paternal Aunt    Asthma Maternal Grandmother      ROS:  Please see the history of present illness.   All other ROS reviewed and negative.     Physical Exam/Data:   Vitals:   10/15/21 0458 10/15/21 0500 10/15/21 0816 10/15/21 1242  BP: (!) 145/69   (!) 158/82  Pulse: 82   77  Resp: 18   20  Temp: 97.7 F (36.5 C)   98.1 F (36.7 C)  TempSrc: Oral   Oral  SpO2: 100%  99% 100%  Weight:  (!) 155.6 kg    Height:        Intake/Output Summary (Last 24 hours) at 10/15/2021 1417 Last data filed at 10/15/2021 0745 Gross per 24 hour  Intake 480 ml  Output 503 ml  Net -23 ml      10/15/2021    5:00 AM 10/14/2021    5:00 AM 10/13/2021    5:00 AM  Last 3 Weights  Weight (lbs) 343 lb 341 lb 8 oz 341 lb 6.4 oz  Weight (kg) 155.584 kg 154.903 kg 154.858 kg     Body mass index is 55.36 kg/m.  General:  Well nourished, well developed, in no acute distress HEENT: normal Neck: no JVD Vascular: Radial pulses 2+  bilaterally Cardiac:  normal S1, S2; RRR; no murmur  Lungs:  diminished breath sounds throughout, expiratory wheezes  Abd: soft, nontender, no hepatomegaly  Ext: no edema Musculoskeletal:  No deformities, BUE and BLE strength normal and equal Skin: warm and dry  Neuro:  CNs 2-12 intact, no focal abnormalities noted Psych:  Normal affect   EKG:  The EKG was personally reviewed and demonstrates:  Sinus rhythm with PACs, prolonged QT (QT/QTcB 412/504 ms)  Telemetry:  Telemetry was personally reviewed and demonstrates:  Patient not on telemetry at the time of my evaluation   Relevant CV Studies:   Laboratory Data:  High Sensitivity Troponin:   Recent Labs  Lab 10/08/21 1802 10/08/21 2009  TROPONINIHS 7 7     Chemistry Recent Labs  Lab 10/13/21 0543 10/14/21 0535 10/15/21 0437  NA 137 138 136  K 3.9 3.9 4.0  CL 101 99 98  CO2 30 32 28  GLUCOSE 162* 170* 290*  BUN 22* 24* 30*  CREATININE 0.63 0.87 0.98  CALCIUM 7.8* 8.1* 7.7*  MG 2.2 2.5* 2.1  GFRNONAA >60 >60 >60  ANIONGAP '6 7 10    '$ Recent Labs  Lab 10/12/21 0510 10/13/21 0543 10/14/21 0535  PROT 6.7 6.1* 6.4*  ALBUMIN 3.2* 2.9* 3.1*  AST '18 15 17  '$ ALT 24 23 32  ALKPHOS 64 59 65  BILITOT 0.3 0.4 0.3  Lipids No results for input(s): "CHOL", "TRIG", "HDL", "LABVLDL", "LDLCALC", "CHOLHDL" in the last 168 hours.  Hematology Recent Labs  Lab 10/13/21 0543 10/14/21 0535 10/15/21 0437  WBC 22.1* 22.0* 19.9*  RBC 4.39 4.56 4.24  HGB 10.9* 11.3* 10.5*  HCT 35.8* 37.4 35.2*  MCV 81.5 82.0 83.0  MCH 24.8* 24.8* 24.8*  MCHC 30.4 30.2 29.8*  RDW 18.2* 18.3* 18.4*  PLT 434* 469* 415*   Thyroid  Recent Labs  Lab 10/09/21 0304  TSH 0.435    BNP Recent Labs  Lab 10/08/21 1802 10/13/21 1253  BNP 36.1 66.3    DDimer No results for input(s): "DDIMER" in the last 168 hours.   Radiology/Studies:  CT Chest High Resolution  Result Date: 10/14/2021 CLINICAL DATA:  Shortness of breath EXAM: CT CHEST  WITHOUT CONTRAST TECHNIQUE: Multidetector CT imaging of the chest was performed following the standard protocol without intravenous contrast. High resolution imaging of the lungs, as well as inspiratory and expiratory imaging, was performed. RADIATION DOSE REDUCTION: This exam was performed according to the departmental dose-optimization program which includes automated exposure control, adjustment of the mA and/or kV according to patient size and/or use of iterative reconstruction technique. COMPARISON:  Multiple priors, most recent chest CT dated June 25th 2023; chest CT dated November 07, 2017; chest CT dated January 27, 2017 FINDINGS: Cardiovascular: Mild cardiomegaly. No pericardial effusion. Moderate coronary artery calcifications of the LAD. No significant atherosclerotic disease of the thoracic aorta. Mediastinum/Nodes: Thyroid and esophagus are unremarkable. No pathologically enlarged lymph nodes seen in the chest. Lungs/Pleura: Central airways are patent. Expiratory phase images on today's exam are inadequate since obtained during inspiration, however on prior CTs with expiratory phase imaging there is no evidence of air trapping. Mild centrilobular and paraseptal emphysema. Mild upper and anterior lung predominant subpleural and peribronchovascular reticular opacities with no evidence of traction bronchiectasis or honeycomb change. Findings are new when compared with January 27, 2017 exam, but improved when compared with November 07, 2017 exam. Mild focal bronchiectasis in the lingula with associated linear opacities, unchanged when compared with most recent prior exam and likely sequela of prior infection or aspiration. No consolidation, pleural effusion or pneumothorax. Upper Abdomen: No acute abnormality. Musculoskeletal: No chest wall mass or suspicious bone lesions identified. IMPRESSION: 1. No acute airspace opacity. 2. Mild upper and anterior lung predominant subpleural and peribronchovascular reticular  opacities with no traction bronchiectasis or evidence of air trapping, findings are new when compared with January 27, 2017 exam, but improved when compared with November 07, 2017 exam. Findings are likely due to scarring related to prior acute lung injury. 3. Moderate coronary artery calcifications of the LAD. 4. Mild emphysema (ICD10-J43.9). Electronically Signed   By: Yetta Glassman M.D.   On: 10/14/2021 20:05     Assessment and Plan:   Chronic diastolic heart failure  Acute on Chronic Hypoxic Respiratory failure  Asthma with acute exacerbation  - Echo this admission pending - Depending on echo results, pulmonary would like [place] to consider possible RHC   - Continue lasix-- patient did report noncompliance   - Continue inhalers/nebs, steroids for asthma treatment   Paroxysmal Atrial fibrillation  - Patient was transitioned from BB to cardizem 120 mg daily this admission  - Continue eliquis 5 mg BID  - Patient was not on telemetry at the time of my evaluation, given her complaints of palpitations I ordered telemetry   Chest Pain  - On presentation, patient complained of occasional chest pain. Pain sounds atypical (  feels like a "pounding", occurs randomly and when at rest, relieved by trying to calm herself down) - hsTn negative x2  - CT scan did show moderate coronary calcifications of the LAD, however I do not think that this is causing her chest pain as it is atypical - If patient gets a RHC this admission, consider doing a LHC as well. Otherwise, I suspect this can be followed as an outpatient - Ordered lipid panel to assess HLD and CAD risk  - Not on BB due to asthma    HTN  - continue amlodipine, catapres, lasix, cardizem   Otherwise per primary  - Severe persistent asthma with acute exacerbation  - GERD     Risk Assessment/Risk Scores:   New York Heart Association (NYHA) Functional Class NYHA Class III  CHA2DS2-VASc Score = 3  This indicates a 3.2% annual risk of  stroke. The patient's score is based upon: CHF History: 1 HTN History: 1 Diabetes History: 0 Stroke History: 0 Vascular Disease History: 0 Age Score: 0 Gender Score: 1         For questions or updates, please contact [place] Please consult [email or web address] for contact info under    Signed, Margie Billet, PA-C  10/15/2021 2:17 PM  Personally seen and examined. Agree with above.  Currently resting comfortably.  Improved from a breathing perspective.  Troponins were normal.  BNP is 66.  Coronary calcium in the LAD noted on CT scan personally reviewed and interpreted.  4 times over the past year she has been admitted for asthma.  I personally reviewed her echocardiogram-report pending-appears unchanged from prior with moderate LVH normal RV function without any significant estimated elevation in PA pressures.  Acute on chronic respiratory failure/acute asthma exacerbation - Echo unchanged from prior. -Continue with Lasix.  She has not been taking regularly at home. -Could consider right heart catheterization after further diuresis, especially if we are struggling understanding her volume status.  Morbid obesity - Continue to encourage weight loss  Chest pain - Fairly atypical, agree.  She does have LAD coronary artery calcification noted.  If she does go forward for right heart catheterization, it would not be unreasonable to perform left heart catheterization as well to ensure that she does not have any significant LAD disease.  SPECT nuclear stress test would not be beneficial.  Potentially, cardiac PET scan could be performed as an outpatient due to body habitus.  Paroxysmal atrial fibrillation - Currently on Cardizem 120 once a day.  Doing well.  Continuing with Eliquis 5 mg twice a day.  Candee Furbish, MD

## 2021-10-16 DIAGNOSIS — I480 Paroxysmal atrial fibrillation: Secondary | ICD-10-CM | POA: Diagnosis not present

## 2021-10-16 DIAGNOSIS — J4551 Severe persistent asthma with (acute) exacerbation: Secondary | ICD-10-CM | POA: Diagnosis not present

## 2021-10-16 DIAGNOSIS — R0600 Dyspnea, unspecified: Secondary | ICD-10-CM | POA: Diagnosis not present

## 2021-10-16 DIAGNOSIS — I5033 Acute on chronic diastolic (congestive) heart failure: Secondary | ICD-10-CM | POA: Diagnosis not present

## 2021-10-16 DIAGNOSIS — J9621 Acute and chronic respiratory failure with hypoxia: Secondary | ICD-10-CM | POA: Diagnosis not present

## 2021-10-16 LAB — CBC WITH DIFFERENTIAL/PLATELET
Abs Immature Granulocytes: 0.75 10*3/uL — ABNORMAL HIGH (ref 0.00–0.07)
Basophils Absolute: 0.1 10*3/uL (ref 0.0–0.1)
Basophils Relative: 0 %
Eosinophils Absolute: 0 10*3/uL (ref 0.0–0.5)
Eosinophils Relative: 0 %
HCT: 35.6 % — ABNORMAL LOW (ref 36.0–46.0)
Hemoglobin: 10.7 g/dL — ABNORMAL LOW (ref 12.0–15.0)
Immature Granulocytes: 3 %
Lymphocytes Relative: 8 %
Lymphs Abs: 1.9 10*3/uL (ref 0.7–4.0)
MCH: 24.8 pg — ABNORMAL LOW (ref 26.0–34.0)
MCHC: 30.1 g/dL (ref 30.0–36.0)
MCV: 82.4 fL (ref 80.0–100.0)
Monocytes Absolute: 1.3 10*3/uL — ABNORMAL HIGH (ref 0.1–1.0)
Monocytes Relative: 5 %
Neutro Abs: 20.6 10*3/uL — ABNORMAL HIGH (ref 1.7–7.7)
Neutrophils Relative %: 84 %
Platelets: 416 10*3/uL — ABNORMAL HIGH (ref 150–400)
RBC: 4.32 MIL/uL (ref 3.87–5.11)
RDW: 18.1 % — ABNORMAL HIGH (ref 11.5–15.5)
WBC: 24.6 10*3/uL — ABNORMAL HIGH (ref 4.0–10.5)
nRBC: 0 % (ref 0.0–0.2)

## 2021-10-16 LAB — MAGNESIUM: Magnesium: 2.2 mg/dL (ref 1.7–2.4)

## 2021-10-16 LAB — BASIC METABOLIC PANEL
Anion gap: 10 (ref 5–15)
BUN: 25 mg/dL — ABNORMAL HIGH (ref 6–20)
CO2: 28 mmol/L (ref 22–32)
Calcium: 8.2 mg/dL — ABNORMAL LOW (ref 8.9–10.3)
Chloride: 98 mmol/L (ref 98–111)
Creatinine, Ser: 0.78 mg/dL (ref 0.44–1.00)
GFR, Estimated: >60 mL/min (ref >60)
Glucose, Bld: 223 mg/dL — ABNORMAL HIGH (ref 70–99)
Potassium: 3.7 mmol/L (ref 3.5–5.1)
Sodium: 136 mmol/L (ref 135–145)

## 2021-10-16 LAB — PHOSPHORUS: Phosphorus: 3.4 mg/dL (ref 2.5–4.6)

## 2021-10-16 LAB — LIPID PANEL
Cholesterol: 192 mg/dL (ref 0–200)
HDL: 99 mg/dL (ref >40)
LDL Cholesterol: 83 mg/dL (ref 0–99)
Total CHOL/HDL Ratio: 1.9 RATIO
Triglycerides: 51 mg/dL (ref <150)
VLDL: 10 mg/dL (ref 0–40)

## 2021-10-16 LAB — GLUCOSE, CAPILLARY
Glucose-Capillary: 193 mg/dL — ABNORMAL HIGH (ref 70–99)
Glucose-Capillary: 173 mg/dL — ABNORMAL HIGH (ref 70–99)
Glucose-Capillary: 180 mg/dL — ABNORMAL HIGH (ref 70–99)
Glucose-Capillary: 224 mg/dL — ABNORMAL HIGH (ref 70–99)

## 2021-10-16 MED ORDER — FUROSEMIDE 10 MG/ML IJ SOLN
40.0000 mg | Freq: Once | INTRAMUSCULAR | Status: AC
  Administered 2021-10-16: 40 mg via INTRAVENOUS
  Filled 2021-10-16: qty 4

## 2021-10-16 MED ORDER — POTASSIUM CHLORIDE CRYS ER 20 MEQ PO TBCR
40.0000 meq | EXTENDED_RELEASE_TABLET | Freq: Once | ORAL | Status: AC
  Administered 2021-10-16: 40 meq via ORAL
  Filled 2021-10-16: qty 2

## 2021-10-16 NOTE — TOC Transition Note (Signed)
Transition of Care [facility]) - CM/SW Discharge Note   Patient Details  Name: April [name] MRN: [id number] Date of Birth: 09-22-1970  [facility]) CM/SW Contact:  Vassie Moselle, LCSW Phone Number: 10/16/2021, 11:41 AM   Clinical Narrative:    Met with pt and confirmed need for bariatric rollator and nebulizer. Jermaine with [employer] has been contacted to supply DME.    Final next level of care: Home/Self Care Barriers to Discharge: No Barriers Identified   Patient Goals and CMS Choice Patient states their goals for this hospitalization and ongoing recovery are:: Return home   Choice offered to / list presented to : Patient  Discharge Placement                       Discharge Plan and Services                DME Arranged: Nebulizer machine, Walker rolling DME Agency: [employer] Date DME Agency Contacted: 10/16/21 Time DME Agency Contacted: 55 Representative spoke with at DME Agency: [place] Interventions     Readmission Risk Interventions    10/16/2021   11:39 AM 10/09/2021   10:48 AM 08/29/2021    2:23 PM  Readmission Risk Prevention Plan  Transportation Screening Complete Complete Complete  Medication Review Press photographer) Complete Complete Complete  PCP or Specialist appointment within 3-5 days of discharge Complete Complete Complete  HRI or Home Care Consult Complete Complete Complete  SW Recovery Care/Counseling Consult  Complete Complete  Palliative Care Screening  Not Applicable Not [place]  Not Applicable Not Applicable

## 2021-10-16 NOTE — Progress Notes (Signed)
PROGRESS NOTE    DECLAN ADAMSON  BOF:[id number] DOB: [birth year] DOA: 10/08/2021 PCP: [name], MD   Brief Narrative:  51 year old with past medical history significant for Asthma, COPD, A-fib on Eliquis, CHF, GERD, Obesity, Diabetes type 2, Hypothyroidism who presents complaining of chest pain, shortness of breath and asthma exacerbation.  She reports wheezing and using her inhalers without significant relief.  She recently completed a steroid taper on Friday.  3 days after she presented with worsening shortness of breath.  She has had recurrent hospitalization for asthma exacerbation : 05/26/2021, 08/24/2021, 08/30/2021. She was started on IV solumedrol, carvedilol change to Cardizem, nebulizer adjusted. Pulmonologist and cardiology consulted. She has given IV lasix 6/19 and 6/20. Cardiology considering right heart cath if volume status is difficult to assess.   Assessment & Plan:   Active Problems:   Severe persistent asthma with acute exacerbation in adult   HTN (hypertension)   AF (paroxysmal atrial fibrillation) (HCC)   Acute respiratory failure (HCC)   Chronic heart failure with preserved ejection fraction (HFpEF) (HCC)   Hypothyroidism   Hypokalemia   Hyperlipidemia   GERD (gastroesophageal reflux disease)   Asthma exacerbation   Body mass index 50.0-59.9, adult ([place])  1-Severe persistent Asthma with acute exacerbation: Continue with IV Solu-Medrol 40 mg twice daily Continue with Singulair Breo-Ellipta and Incruse--- Change to xopenex/ipratropium on  6/17--subsequently change  to Brovana and Yulperi 6/19 by pulmonologist  Continue with guaifenesin. Continue with flutter valve. PPI twice daily to help prevent reflux. Discussed with patient benefit of weight loss/  Completed  Azithromycin, 5 days.  Had panic attack 6/16, received klonopin.  Continue with current management , Pulmonology consulted. Changes nebulizer. They will follow up on patient.  ECHO stable, Ef 60-65  %   2-Acute on Chronic Hypoxic Respiratory Failure; asthma exacerbation and component of acute Diastolic Heart failure exacerbation.  Use 4 L of oxygen at home at hs.  She did well on ambulation, oxygen sat remain 100 % Plan to continue with diuresis. Could consider heart cath after further diuresis ,if volume status difficult to assess.  Discussed with Cardiology, plan to repeat another dose of IV lasix today.   3-Hypertension: Continue with amlodipine, Catapres, , Lasix and as needed hydralazine  4-Paroxysmal A-fib: Continue with Eliquis.  Discontinue Coreg. Discussed with cardiology, started Cardizem 120 mg daily.   5-Acute on Chronic diastolic heart failure: Continue with oral Lasix CAD calcification on CT. Cardiology following.  Atypical chest pain.  If she required right heart cath, could proceed with left heart cath per cardiology. Potential cardiac PET scan could be performed out patient.  Repeat IV lasix today.   GERD: Change PPI to twice daily  Hyperlipidemia Continue Lipitor Hypothyroidism: Continue with Synthroid. Leukocytosis; Secondary to steroids. Afebrile.  Morbid obesity : Needs lifestyle modification Hypophosphatemia; Replaced.   Estimated body mass index is 55.52 kg/m as calculated from the following:   Height as of this encounter: '5\' 6"'$  (1.676 m).   Weight as of this encounter: 156 kg.   DVT prophylaxis: Eliquis Code Status: full code Family Communication: Care discussed with patient.  Disposition Plan:  Status is: Inpatient Remains inpatient appropriate because: management of asthma exacerbation. Transfer to med surgery     Consultants:  None  Procedures:  None  Antimicrobials:    Subjective: She is feeling better, dyspnea improving.   Objective: Vitals:   10/16/21 0351 10/16/21 0510 10/16/21 1029 10/16/21 1318  BP:  (!) 148/88  (!) 145/64  Pulse:  71  78  Resp:    (!) 22  Temp:  98.1 F (36.7 C)  98.3 F (36.8 C)  TempSrc:  Oral   Oral  SpO2:  100% 100% 100%  Weight: (!) 156 kg     Height:        Intake/Output Summary (Last 24 hours) at 10/16/2021 1636 Last data filed at 10/16/2021 1320 Gross per 24 hour  Intake 1800 ml  Output 3600 ml  Net -1800 ml    Filed Weights   10/14/21 0500 10/15/21 0500 10/16/21 0351  Weight: (!) 154.9 kg (!) 155.6 kg (!) 156 kg    Examination:  General exam: Obese Respiratory system: BL wheezing.  Cardiovascular system: S 1, S 2 RRR Gastrointestinal system: BS present, soft, nt Central nervous system: Alert Extremities: edema     Data Reviewed: I have personally reviewed following labs and imaging studies  CBC: Recent Labs  Lab 10/12/21 0510 10/13/21 0543 10/14/21 0535 10/15/21 0437 10/16/21 0506  WBC 23.0* 22.1* 22.0* 19.9* 24.6*  NEUTROABS 19.3* 17.3* 17.3* 16.8* 20.6*  HGB 11.4* 10.9* 11.3* 10.5* 10.7*  HCT 37.9 35.8* 37.4 35.2* 35.6*  MCV 82.0 81.5 82.0 83.0 82.4  PLT 448* 434* 469* 415* 416*    Basic Metabolic Panel: Recent Labs  Lab 10/12/21 0510 10/13/21 0543 10/14/21 0535 10/15/21 0437 10/16/21 0506  NA 138 137 138 136 136  K 3.6 3.9 3.9 4.0 3.7  CL 102 101 99 98 98  CO2 27 30 32 28 28  GLUCOSE 244* 162* 170* 290* 223*  BUN 22* 22* 24* 30* 25*  CREATININE 0.77 0.63 0.87 0.98 0.78  CALCIUM 8.1* 7.8* 8.1* 7.7* 8.2*  MG 2.5* 2.2 2.5* 2.1 2.2  PHOS 3.2 3.4 3.3 3.3 3.4    GFR: Estimated Creatinine Clearance: 133 mL/min (by C-G formula based on SCr of 0.78 mg/dL). Liver Function Tests: Recent Labs  Lab 10/10/21 0319 10/11/21 0535 10/12/21 0510 10/13/21 0543 10/14/21 0535  AST 12* 12* '18 15 17  '$ ALT '13 13 24 23 '$ 32  ALKPHOS 66 65 64 59 65  BILITOT 0.5 0.4 0.3 0.4 0.3  PROT 6.7 6.7 6.7 6.1* 6.4*  ALBUMIN 3.1* 3.0* 3.2* 2.9* 3.1*    No results for input(s): "LIPASE", "AMYLASE" in the last 168 hours. No results for input(s): "AMMONIA" in the last 168 hours. Coagulation Profile: No results for input(s): "INR", "PROTIME" in the last  168 hours. Cardiac Enzymes: No results for input(s): "CKTOTAL", "CKMB", "CKMBINDEX", "TROPONINI" in the last 168 hours.  BNP (last 3 results) No results for input(s): "PROBNP" in the last 8760 hours. HbA1C: No results for input(s): "HGBA1C" in the last 72 hours. CBG: Recent Labs  Lab 10/15/21 1229 10/15/21 1617 10/15/21 2222 10/16/21 0725 10/16/21 1149  GLUCAP 221* 256* 210* 193* 173*    Lipid Profile: Recent Labs    10/16/21 0506  CHOL 192  HDL 99  LDLCALC 83  TRIG 51  CHOLHDL 1.9   Thyroid Function Tests: No results for input(s): "TSH", "T4TOTAL", "FREET4", "T3FREE", "THYROIDAB" in the last 72 hours.  Anemia Panel: No results for input(s): "VITAMINB12", "FOLATE", "FERRITIN", "TIBC", "IRON", "RETICCTPCT" in the last 72 hours. Sepsis Labs: Recent Labs  Lab 10/13/21 1151 10/13/21 1404  LATICACIDVEN 1.1 1.8     Recent Results (from the past 240 hour(s))  SARS Coronavirus 2 by RT PCR (hospital order, performed in [facility] hospital lab) *cepheid single result test* Anterior Nasal Swab     Status: None   Collection Time: 10/08/21 10:22  PM   Specimen: Anterior Nasal Swab  Result Value Ref Range Status   SARS Coronavirus 2 by RT PCR NEGATIVE NEGATIVE Final    Comment: (NOTE) SARS-CoV-2 target nucleic acids are NOT DETECTED.  The SARS-CoV-2 RNA is generally detectable in upper and lower respiratory specimens during the acute phase of infection. The lowest concentration of SARS-CoV-2 viral copies this assay can detect is 250 copies / mL. A negative result does not preclude SARS-CoV-2 infection and should not be used as the sole basis for treatment or other patient management decisions.  A negative result may occur with improper specimen collection / handling, submission of specimen other than nasopharyngeal swab, presence of viral mutation(s) within the areas targeted by this assay, and inadequate number of viral copies (<250 copies / mL). A negative result must  be combined with clinical observations, patient history, and epidemiological information.  Fact Sheet for Patients:   https://www.patel.info/  Fact Sheet for Healthcare Providers: https://hall.com/  This test is not yet approved or  cleared by the [place] FDA and has been authorized for detection and/or diagnosis of SARS-CoV-2 by FDA under an Emergency Use Authorization (EUA).  This EUA will remain in effect (meaning this test can be used) for the duration of the COVID-19 declaration under Section 564(b)(1) of the Act, 21 U.S.C. section 360bbb-3(b)(1), unless the authorization is terminated or revoked sooner.  Performed at [facility], [place] [address] [place], [place] [address]   Respiratory (~20 pathogens) panel by PCR     Status: None   Collection Time: 10/09/21  3:02 AM   Specimen: Nasopharyngeal Swab; Respiratory  Result Value Ref Range Status   Adenovirus NOT DETECTED NOT DETECTED Final   Coronavirus 229E NOT DETECTED NOT DETECTED Final    Comment: (NOTE) The Coronavirus on the Respiratory Panel, DOES NOT test for the novel  Coronavirus (2019 nCoV)    Coronavirus HKU1 NOT DETECTED NOT DETECTED Final   Coronavirus NL63 NOT DETECTED NOT DETECTED Final   Coronavirus OC43 NOT DETECTED NOT DETECTED Final   Metapneumovirus NOT DETECTED NOT DETECTED Final   Rhinovirus / Enterovirus NOT DETECTED NOT DETECTED Final   Influenza A NOT DETECTED NOT DETECTED Final   Influenza B NOT DETECTED NOT DETECTED Final   Parainfluenza Virus 1 NOT DETECTED NOT DETECTED Final   Parainfluenza Virus 2 NOT DETECTED NOT DETECTED Final   Parainfluenza Virus 3 NOT DETECTED NOT DETECTED Final   Parainfluenza Virus 4 NOT DETECTED NOT DETECTED Final   Respiratory Syncytial Virus NOT DETECTED NOT DETECTED Final   Bordetella pertussis NOT DETECTED NOT DETECTED Final   Bordetella Parapertussis NOT DETECTED NOT DETECTED Final    Chlamydophila pneumoniae NOT DETECTED NOT DETECTED Final   Mycoplasma pneumoniae NOT DETECTED NOT DETECTED Final    Comment: Performed at [facility], [place]. [address], [place], [place] [address]  MRSA Next Gen by PCR, Nasal     Status: None   Collection Time: 10/09/21  3:02 AM   Specimen: Nasal Mucosa; Nasal Swab  Result Value Ref Range Status   MRSA by PCR Next Gen NOT DETECTED NOT DETECTED Final    Comment: (NOTE) The GeneXpert MRSA Assay (FDA approved for NASAL specimens only), is one component of a comprehensive MRSA colonization surveillance program. It is not intended to diagnose MRSA infection nor to guide or monitor treatment for MRSA infections. Test performance is not FDA approved in patients less than 47 years old. Performed at [facility], [name] [place], [place] [address]          Radiology Studies: ECHOCARDIOGRAM COMPLETE  Result Date: 10/15/2021    ECHOCARDIOGRAM REPORT   Patient Name:   BRANDEY VANDALEN Date of Exam: 10/15/2021 Medical Rec #:  [id number]    Height:       66.0 in Accession #:    [id number]   Weight:       343.0 lb Date of Birth:  [birth year]     BSA:          2.515 m Patient Age:    78 years     BP:           145/69 mmHg Patient Gender: F            HR:           78 bpm. Exam Location:  Inpatient Procedure: 2D Echo, Cardiac Doppler and Color Doppler Indications:    CHF  History:        Patient has prior history of Echocardiogram examinations, most                 recent 05/25/2021.  Sonographer:    Joette Catching RCS Referring Phys: [place]  1. Left ventricular ejection fraction, by estimation, is 60 to 65%. The left ventricle has normal function. The left ventricle has no regional wall motion abnormalities. There is moderate left ventricular hypertrophy. Left ventricular diastolic parameters are consistent with Grade I diastolic dysfunction (impaired relaxation).  2. Right ventricular systolic function is  normal. The right ventricular size is normal.  3. The mitral valve is normal in structure. Trivial mitral valve regurgitation. No evidence of mitral stenosis.  4. The aortic valve is normal in structure. Aortic valve regurgitation is not visualized. No aortic stenosis is present.  5. The inferior vena cava is normal in size with greater than 50% respiratory variability, suggesting right atrial pressure of 3 mmHg. FINDINGS  Left Ventricle: Left ventricular ejection fraction, by estimation, is 60 to 65%. The left ventricle has normal function. The left ventricle has no regional wall motion abnormalities. The left ventricular internal cavity size was normal in size. There is  moderate left ventricular hypertrophy. Left ventricular diastolic parameters are consistent with Grade I diastolic dysfunction (impaired relaxation). Right Ventricle: The right ventricular size is normal. No increase in right ventricular wall thickness. Right ventricular systolic function is normal. Left Atrium: Left atrial size was normal in size. Right Atrium: Right atrial size was normal in size. Pericardium: There is no evidence of pericardial effusion. Mitral Valve: The mitral valve is normal in structure. Trivial mitral valve regurgitation. No evidence of mitral valve stenosis. Tricuspid Valve: The tricuspid valve is normal in structure. Tricuspid valve regurgitation is not demonstrated. No evidence of tricuspid stenosis. Aortic Valve: The aortic valve is normal in structure. Aortic valve regurgitation is not visualized. No aortic stenosis is present. Aortic valve mean gradient measures 6.0 mmHg. Aortic valve peak gradient measures 11.7 mmHg. Aortic valve area, by VTI measures 3.19 cm. Pulmonic Valve: The pulmonic valve was normal in structure. Pulmonic valve regurgitation is not visualized. No evidence of pulmonic stenosis. Aorta: The aortic root is normal in size and structure. Venous: The inferior vena cava is normal in size with greater  than 50% respiratory variability, suggesting right atrial pressure of 3 mmHg. IAS/Shunts: No atrial level shunt detected by color flow Doppler.  LEFT VENTRICLE PLAX 2D LVIDd:         5.30 cm  Diastology LVIDs:         3.60 cm   LV e' medial:    8.92 cm/s LV PW:         1.40 cm   LV E/e' medial:  12.2 LV IVS:        1.50 cm   LV e' lateral:   8.38 cm/s LVOT diam:     2.30 cm   LV E/e' lateral: 13.0 LV SV:         103 LV SV Index:   41 LVOT Area:     4.15 cm  RIGHT VENTRICLE             IVC RV Basal diam:  3.50 cm     IVC diam: 2.00 cm RV Mid diam:    2.60 cm RV S prime:     17.70 cm/s LEFT ATRIUM             Index        RIGHT ATRIUM           Index LA diam:        3.80 cm 1.51 cm/m   RA Area:     12.60 cm LA Vol (A2C):   43.3 ml 17.22 ml/m  RA Volume:   28.60 ml  11.37 ml/m LA Vol (A4C):   41.9 ml 16.66 ml/m LA Biplane Vol: 44.8 ml 17.81 ml/m  AORTIC VALVE                     PULMONIC VALVE AV Area (Vmax):    3.23 cm      PV Vmax:          1.57 m/s AV Area (Vmean):   3.21 cm      PV Peak grad:     9.9 mmHg AV Area (VTI):     3.19 cm      PR End Diast Vel: 3.41 msec AV Vmax:           171.00 cm/s AV Vmean:          114.000 cm/s AV VTI:            0.324 m AV Peak Grad:      11.7 mmHg AV Mean Grad:      6.0 mmHg LVOT Vmax:         133.00 cm/s LVOT Vmean:        88.100 cm/s LVOT VTI:          0.249 m LVOT/AV VTI ratio: 0.77  AORTA Ao Root diam: 3.00 cm Ao Asc diam:  2.90 cm MITRAL VALVE                TRICUSPID VALVE MV Area (PHT): 3.60 cm     TR Peak grad:   16.2 mmHg MV Decel Time: 211 msec     TR Vmax:        201.00 cm/s MV E velocity: 109.00 cm/s MV A velocity: 107.00 cm/s  SHUNTS MV E/A ratio:  1.02         Systemic VTI:  0.25 m                             Systemic Diam: 2.30 cm Glori Bickers MD Electronically signed by Glori Bickers MD Signature Date/Time: 10/15/2021/4:13:50 PM    Final    CT Chest High Resolution  Result Date: 10/14/2021 CLINICAL DATA:  Shortness of breath EXAM: CT  CHEST  WITHOUT CONTRAST TECHNIQUE: Multidetector CT imaging of the chest was performed following the standard protocol without intravenous contrast. High resolution imaging of the lungs, as well as inspiratory and expiratory imaging, was performed. RADIATION DOSE REDUCTION: This exam was performed according to the departmental dose-optimization program which includes automated exposure control, adjustment of the mA and/or kV according to patient size and/or use of iterative reconstruction technique. COMPARISON:  Multiple priors, most recent chest CT dated June 25th 2023; chest CT dated November 07, 2017; chest CT dated January 27, 2017 FINDINGS: Cardiovascular: Mild cardiomegaly. No pericardial effusion. Moderate coronary artery calcifications of the LAD. No significant atherosclerotic disease of the thoracic aorta. Mediastinum/Nodes: Thyroid and esophagus are unremarkable. No pathologically enlarged lymph nodes seen in the chest. Lungs/Pleura: Central airways are patent. Expiratory phase images on today's exam are inadequate since obtained during inspiration, however on prior CTs with expiratory phase imaging there is no evidence of air trapping. Mild centrilobular and paraseptal emphysema. Mild upper and anterior lung predominant subpleural and peribronchovascular reticular opacities with no evidence of traction bronchiectasis or honeycomb change. Findings are new when compared with January 27, 2017 exam, but improved when compared with November 07, 2017 exam. Mild focal bronchiectasis in the lingula with associated linear opacities, unchanged when compared with most recent prior exam and likely sequela of prior infection or aspiration. No consolidation, pleural effusion or pneumothorax. Upper Abdomen: No acute abnormality. Musculoskeletal: No chest wall mass or suspicious bone lesions identified. IMPRESSION: 1. No acute airspace opacity. 2. Mild upper and anterior lung predominant subpleural and peribronchovascular reticular  opacities with no traction bronchiectasis or evidence of air trapping, findings are new when compared with January 27, 2017 exam, but improved when compared with November 07, 2017 exam. Findings are likely due to scarring related to prior acute lung injury. 3. Moderate coronary artery calcifications of the LAD. 4. Mild emphysema (ICD10-J43.9). Electronically Signed   By: Yetta Glassman M.D.   On: 10/14/2021 20:05        Scheduled Meds:  amLODipine  10 mg Oral Daily   apixaban  5 mg Oral BID   arformoterol  15 mcg Nebulization BID   atorvastatin  20 mg Oral q morning   Chlorhexidine Gluconate Cloth  6 each Topical Daily   cloNIDine  0.2 mg Oral BID   dextromethorphan-guaiFENesin  1 tablet Oral BID   diltiazem  120 mg Oral Daily   DULoxetine  60 mg Oral Daily   furosemide  40 mg Intravenous Once   furosemide  40 mg Oral Daily   levothyroxine  50 mcg Oral Daily   loratadine  10 mg Oral Daily   mouth rinse  15 mL Mouth Rinse BID   methylPREDNISolone (SOLU-MEDROL) injection  60 mg Intravenous Q12H   montelukast  10 mg Oral QHS   pantoprazole  40 mg Oral BID   phosphorus  250 mg Oral BID   potassium chloride  40 mEq Oral Once   pregabalin  300 mg Oral BID   revefenacin  175 mcg Nebulization Daily   sodium chloride flush  3 mL Intravenous Q12H   topiramate  200 mg Oral QHS   Continuous Infusions:  sodium chloride Stopped (10/10/21 0836)     LOS: 8 days    Time spent: 35 minutes    Tenisha Fleece A Endya Austin, MD [facility]   If 7PM-7AM, please contact night-coverage [email or web address]  10/16/2021, 4:36 PM

## 2021-10-16 NOTE — Evaluation (Addendum)
Physical Therapy Evaluation Patient Details Name: KYNA BLAHNIK MRN: [id number] DOB: 09/29/1970 Today's Date: 10/16/2021  History of Present Illness  51 year old female admitted 10/08/21 with chest pain, SOB, asthma exacerbation. Pt with past medical history significant for asthma, COPD, A-fib on Eliquis, CHF, GERD, obesity, diabetes type 2 hypothyroidism.  Clinical Impression  Pt was ambulating in her room independently without supplemental O2, SpO2 100% on room air at rest. Pt ambulated 180' holding dynamap cart for single UE support.   SpO2 100%, HR 103, 3/4 dyspnea with walking on room air. No loss of balance. Rollator recommended for home to facilitate increased activity tolerance. No further PT indicated as pt is mobilizing independently. Will sign off.      Recommendations for follow up therapy are one component of a multi-disciplinary discharge planning process, led by the attending physician.  Recommendations may be updated based on patient status, additional functional criteria and insurance authorization.  Follow Up Recommendations No PT follow up    Assistance Recommended at Discharge None  Patient can return home with the following  Help with stairs or ramp for entrance    Equipment Recommendations Rollator (4 wheels);Other (comment) (bariatric rollator)  Recommendations for Other Services       Functional Status Assessment Patient has had a recent decline in their functional status and demonstrates the ability to make significant improvements in function in a reasonable and predictable amount of time.     Precautions / Restrictions Precautions Precautions: None Precaution Comments: pt denies falls in past 6 months Restrictions Weight Bearing Restrictions: No      Mobility  Bed Mobility Overal bed mobility: Independent                  Transfers Overall transfer level: Independent                      Ambulation/Gait Ambulation/Gait assistance:  Modified independent (Device/Increase time) Gait Distance (Feet): 180 Feet Assistive device: IV Pole   Gait velocity: WFL     General Gait Details: SpO2 100% on room air walking, HR 103, 3/4 dyspnea with wheezing noted, VCs for pursed lip breathing  Stairs            Wheelchair Mobility    Modified Rankin (Stroke Patients Only)       Balance Overall balance assessment: No apparent balance deficits (not formally assessed)                                           Pertinent Vitals/Pain Pain Assessment Pain Assessment: No/denies pain Pain Score: 0-No pain    Home Living Family/patient expects to be discharged to:: Private residence Living Arrangements: Alone Available Help at Discharge: Other (Comment) (pt stated she doesn't have any assistance available) Type of Home: Apartment Home Access: Level entry       Home Layout: One level Home Equipment: None      Prior Function Prior Level of Function : Independent/Modified Independent;Driving             Mobility Comments: walked without AD, denies falls in past 6 months ADLs Comments: independent     Hand Dominance        Extremity/Trunk Assessment   Upper Extremity Assessment Upper Extremity Assessment: Overall WFL for tasks assessed    Lower Extremity Assessment Lower Extremity Assessment: Overall WFL for tasks assessed  Cervical / Trunk Assessment Cervical / Trunk Assessment: Normal  Communication   Communication: No difficulties  Cognition Arousal/Alertness: Awake/alert Behavior During Therapy: WFL for tasks assessed/performed Overall Cognitive Status: Within Functional Limits for tasks assessed                                          General Comments      Exercises     Assessment/Plan    PT Assessment Patient does not need any further PT services  PT Problem List         PT Treatment Interventions      PT Goals (Current goals can be  found in the Care Plan section)  Acute Rehab PT Goals Patient Stated Goal: to walk a little faster, be able to do house cleaning PT Goal Formulation: All assessment and education complete, DC therapy    Frequency       Co-evaluation               AM-PAC PT "6 Clicks" Mobility  Outcome Measure Help needed turning from your back to your side while in a flat bed without using bedrails?: None Help needed moving from lying on your back to sitting on the side of a flat bed without using bedrails?: None Help needed moving to and from a bed to a chair (including a wheelchair)?: None Help needed standing up from a chair using your arms (e.g., wheelchair or bedside chair)?: None Help needed to walk in hospital room?: None Help needed climbing 3-5 steps with a railing? : A Little 6 Click Score: 23    End of Session   Activity Tolerance: Patient limited by fatigue Patient left: in bed;with call bell/phone within reach Nurse Communication: Mobility status      Time: 8016-5537 PT Time Calculation (min) (ACUTE ONLY): 13 min   Charges:   PT Evaluation $PT Re-evaluation: 1 Re-eval          [name] PT 10/16/2021  Acute Rehabilitation Services Pager [phone] Office [phone]

## 2021-10-16 NOTE — Progress Notes (Signed)
Progress Note  Patient Name: April [name] Date of Encounter: 10/16/2021  [facility] Cardiologist: Sanda Klein, MD   Subjective   Feeling a little bit better.  Inpatient Medications    Scheduled Meds:  amLODipine  10 mg Oral Daily   apixaban  5 mg Oral BID   arformoterol  15 mcg Nebulization BID   atorvastatin  20 mg Oral q morning   Chlorhexidine Gluconate Cloth  6 each Topical Daily   cloNIDine  0.2 mg Oral BID   dextromethorphan-guaiFENesin  1 tablet Oral BID   diltiazem  120 mg Oral Daily   DULoxetine  60 mg Oral Daily   furosemide  40 mg Oral Daily   levothyroxine  50 mcg Oral Daily   loratadine  10 mg Oral Daily   mouth rinse  15 mL Mouth Rinse BID   methylPREDNISolone (SOLU-MEDROL) injection  60 mg Intravenous Q12H   montelukast  10 mg Oral QHS   pantoprazole  40 mg Oral BID   phosphorus  250 mg Oral BID   pregabalin  300 mg Oral BID   revefenacin  175 mcg Nebulization Daily   sodium chloride flush  3 mL Intravenous Q12H   topiramate  200 mg Oral QHS   Continuous Infusions:  sodium chloride Stopped (10/10/21 0836)   PRN Meds: sodium chloride, acetaminophen **OR** acetaminophen, alum & mag hydroxide-simeth, clonazePAM, hydrALAZINE, HYDROcodone-acetaminophen, levalbuterol, sodium chloride flush, zolpidem   Vital Signs    Vitals:   10/16/21 0351 10/16/21 0510 10/16/21 1029 10/16/21 1318  BP:  (!) 148/88  (!) 145/64  Pulse:  71  78  Resp:    (!) 22  Temp:  98.1 F (36.7 C)  98.3 F (36.8 C)  TempSrc:  Oral  Oral  SpO2:  100% 100% 100%  Weight: (!) 156 kg     Height:        Intake/Output Summary (Last 24 hours) at 10/16/2021 1516 Last data filed at 10/16/2021 1320 Gross per 24 hour  Intake 1800 ml  Output 3600 ml  Net -1800 ml      10/16/2021    3:51 AM 10/15/2021    5:00 AM 10/14/2021    5:00 AM  Last 3 Weights  Weight (lbs) 344 lb 343 lb 341 lb 8 oz  Weight (kg) 156.037 kg 155.584 kg 154.903 kg       Physical Exam   GEN: No acute  distress.   Neck: No JVD Cardiac: RRR, no murmurs, rubs, or gallops.  Respiratory: Clear to auscultation bilaterally. GI: Soft, nontender, non-distended  MS: No edema; No deformity. Neuro:  Nonfocal  Psych: Normal affect   Labs    High Sensitivity Troponin:   Recent Labs  Lab 10/08/21 1802 10/08/21 2009  TROPONINIHS 7 7     Chemistry Recent Labs  Lab 10/12/21 0510 10/13/21 0543 10/14/21 0535 10/15/21 0437 10/16/21 0506  NA 138 137 138 136 136  K 3.6 3.9 3.9 4.0 3.7  CL 102 101 99 98 98  CO2 27 30 32 28 28  GLUCOSE 244* 162* 170* 290* 223*  BUN 22* 22* 24* 30* 25*  CREATININE 0.77 0.63 0.87 0.98 0.78  CALCIUM 8.1* 7.8* 8.1* 7.7* 8.2*  MG 2.5* 2.2 2.5* 2.1 2.2  PROT 6.7 6.1* 6.4*  --   --   ALBUMIN 3.2* 2.9* 3.1*  --   --   AST '18 15 17  '$ --   --   ALT 24 23 32  --   --  ALKPHOS 64 59 65  --   --   BILITOT 0.3 0.4 0.3  --   --   GFRNONAA >60 >60 >60 >60 >60  ANIONGAP '9 6 7 10 10    '$ Lipids  Recent Labs  Lab 10/16/21 0506  CHOL 192  TRIG 51  HDL 99  LDLCALC 83  CHOLHDL 1.9    Hematology Recent Labs  Lab 10/14/21 0535 10/15/21 0437 10/16/21 0506  WBC 22.0* 19.9* 24.6*  RBC 4.56 4.24 4.32  HGB 11.3* 10.5* 10.7*  HCT 37.4 35.2* 35.6*  MCV 82.0 83.0 82.4  MCH 24.8* 24.8* 24.8*  MCHC 30.2 29.8* 30.1  RDW 18.3* 18.4* 18.1*  PLT 469* 415* 416*   Thyroid No results for input(s): "TSH", "FREET4" in the last 168 hours.  BNP Recent Labs  Lab 10/13/21 1253  BNP 66.3    DDimer No results for input(s): "DDIMER" in the last 168 hours.   Radiology    ECHOCARDIOGRAM COMPLETE  Result Date: 10/15/2021    ECHOCARDIOGRAM REPORT   Patient Name:   April [name] Date of Exam: 10/15/2021 Medical Rec #:  [id number]    Height:       66.0 in Accession #:    [id number]   Weight:       343.0 lb Date of Birth:  May 13, 1970     BSA:          2.515 m Patient Age:    51 years     BP:           145/69 mmHg Patient Gender: F            HR:           78 bpm. Exam Location:   Inpatient Procedure: 2D Echo, Cardiac Doppler and Color Doppler Indications:    CHF  History:        Patient has prior history of Echocardiogram examinations, most                 recent 05/25/2021.  Sonographer:    Joette Catching RCS Referring Phys: [place]  1. Left ventricular ejection fraction, by estimation, is 60 to 65%. The left ventricle has normal function. The left ventricle has no regional wall motion abnormalities. There is moderate left ventricular hypertrophy. Left ventricular diastolic parameters are consistent with Grade I diastolic dysfunction (impaired relaxation).  2. Right ventricular systolic function is normal. The right ventricular size is normal.  3. The mitral valve is normal in structure. Trivial mitral valve regurgitation. No evidence of mitral stenosis.  4. The aortic valve is normal in structure. Aortic valve regurgitation is not visualized. No aortic stenosis is present.  5. The inferior vena cava is normal in size with greater than 50% respiratory variability, suggesting right atrial pressure of 3 mmHg. FINDINGS  Left Ventricle: Left ventricular ejection fraction, by estimation, is 60 to 65%. The left ventricle has normal function. The left ventricle has no regional wall motion abnormalities. The left ventricular internal cavity size was normal in size. There is  moderate left ventricular hypertrophy. Left ventricular diastolic parameters are consistent with Grade I diastolic dysfunction (impaired relaxation). Right Ventricle: The right ventricular size is normal. No increase in right ventricular wall thickness. Right ventricular systolic function is normal. Left Atrium: Left atrial size was normal in size. Right Atrium: Right atrial size was normal in size. Pericardium: There is no evidence of pericardial effusion. Mitral Valve: The mitral valve is normal in structure. Trivial mitral  valve regurgitation. No evidence of mitral valve stenosis. Tricuspid Valve:  The tricuspid valve is normal in structure. Tricuspid valve regurgitation is not demonstrated. No evidence of tricuspid stenosis. Aortic Valve: The aortic valve is normal in structure. Aortic valve regurgitation is not visualized. No aortic stenosis is present. Aortic valve mean gradient measures 6.0 mmHg. Aortic valve peak gradient measures 11.7 mmHg. Aortic valve area, by VTI measures 3.19 cm. Pulmonic Valve: The pulmonic valve was normal in structure. Pulmonic valve regurgitation is not visualized. No evidence of pulmonic stenosis. Aorta: The aortic root is normal in size and structure. Venous: The inferior vena cava is normal in size with greater than 50% respiratory variability, suggesting right atrial pressure of 3 mmHg. IAS/Shunts: No atrial level shunt detected by color flow Doppler.  LEFT VENTRICLE PLAX 2D LVIDd:         5.30 cm   Diastology LVIDs:         3.60 cm   LV e' medial:    8.92 cm/s LV PW:         1.40 cm   LV E/e' medial:  12.2 LV IVS:        1.50 cm   LV e' lateral:   8.38 cm/s LVOT diam:     2.30 cm   LV E/e' lateral: 13.0 LV SV:         103 LV SV Index:   41 LVOT Area:     4.15 cm  RIGHT VENTRICLE             IVC RV Basal diam:  3.50 cm     IVC diam: 2.00 cm RV Mid diam:    2.60 cm RV S prime:     17.70 cm/s LEFT ATRIUM             Index        RIGHT ATRIUM           Index LA diam:        3.80 cm 1.51 cm/m   RA Area:     12.60 cm LA Vol (A2C):   43.3 ml 17.22 ml/m  RA Volume:   28.60 ml  11.37 ml/m LA Vol (A4C):   41.9 ml 16.66 ml/m LA Biplane Vol: 44.8 ml 17.81 ml/m  AORTIC VALVE                     PULMONIC VALVE AV Area (Vmax):    3.23 cm      PV Vmax:          1.57 m/s AV Area (Vmean):   3.21 cm      PV Peak grad:     9.9 mmHg AV Area (VTI):     3.19 cm      PR End Diast Vel: 3.41 msec AV Vmax:           171.00 cm/s AV Vmean:          114.000 cm/s AV VTI:            0.324 m AV Peak Grad:      11.7 mmHg AV Mean Grad:      6.0 mmHg LVOT Vmax:         133.00 cm/s LVOT Vmean:         88.100 cm/s LVOT VTI:          0.249 m LVOT/AV VTI ratio: 0.77  AORTA Ao Root diam: 3.00 cm Ao Asc diam:  2.90 cm MITRAL  VALVE                TRICUSPID VALVE MV Area (PHT): 3.60 cm     TR Peak grad:   16.2 mmHg MV Decel Time: 211 msec     TR Vmax:        201.00 cm/s MV E velocity: 109.00 cm/s MV A velocity: 107.00 cm/s  SHUNTS MV E/A ratio:  1.02         Systemic VTI:  0.25 m                             Systemic Diam: 2.30 cm Glori Bickers MD Electronically signed by Glori Bickers MD Signature Date/Time: 10/15/2021/4:13:50 PM    Final    CT Chest High Resolution  Result Date: 10/14/2021 CLINICAL DATA:  Shortness of breath EXAM: CT CHEST WITHOUT CONTRAST TECHNIQUE: Multidetector CT imaging of the chest was performed following the standard protocol without intravenous contrast. High resolution imaging of the lungs, as well as inspiratory and expiratory imaging, was performed. RADIATION DOSE REDUCTION: This exam was performed according to the departmental dose-optimization program which includes automated exposure control, adjustment of the mA and/or kV according to patient size and/or use of iterative reconstruction technique. COMPARISON:  Multiple priors, most recent chest CT dated June 25th 2023; chest CT dated November 07, 2017; chest CT dated January 27, 2017 FINDINGS: Cardiovascular: Mild cardiomegaly. No pericardial effusion. Moderate coronary artery calcifications of the LAD. No significant atherosclerotic disease of the thoracic aorta. Mediastinum/Nodes: Thyroid and esophagus are unremarkable. No pathologically enlarged lymph nodes seen in the chest. Lungs/Pleura: Central airways are patent. Expiratory phase images on today's exam are inadequate since obtained during inspiration, however on prior CTs with expiratory phase imaging there is no evidence of air trapping. Mild centrilobular and paraseptal emphysema. Mild upper and anterior lung predominant subpleural and peribronchovascular reticular opacities  with no evidence of traction bronchiectasis or honeycomb change. Findings are new when compared with January 27, 2017 exam, but improved when compared with November 07, 2017 exam. Mild focal bronchiectasis in the lingula with associated linear opacities, unchanged when compared with most recent prior exam and likely sequela of prior infection or aspiration. No consolidation, pleural effusion or pneumothorax. Upper Abdomen: No acute abnormality. Musculoskeletal: No chest wall mass or suspicious bone lesions identified. IMPRESSION: 1. No acute airspace opacity. 2. Mild upper and anterior lung predominant subpleural and peribronchovascular reticular opacities with no traction bronchiectasis or evidence of air trapping, findings are new when compared with January 27, 2017 exam, but improved when compared with November 07, 2017 exam. Findings are likely due to scarring related to prior acute lung injury. 3. Moderate coronary artery calcifications of the LAD. 4. Mild emphysema (ICD10-J43.9). Electronically Signed   By: Yetta Glassman M.D.   On: 10/14/2021 20:05    Cardiac Studies   Echocardiogram   1. Left ventricular ejection fraction, by estimation, is 60 to 65%. The  left ventricle has normal function. The left ventricle has no regional  wall motion abnormalities. There is moderate left ventricular hypertrophy.  Left ventricular diastolic  parameters are consistent with Grade I diastolic dysfunction (impaired  relaxation).   2. Right ventricular systolic function is normal. The right ventricular  size is normal.   3. The mitral valve is normal in structure. Trivial mitral valve  regurgitation. No evidence of mitral stenosis.   4. The aortic valve is normal in structure. Aortic valve regurgitation is  not visualized. No aortic stenosis is present.   5. The inferior vena cava is normal in size with greater than 50%  respiratory variability, suggesting right atrial pressure of 3 mmHg.   Patient Profile      51 y.o. female with coronary artery calcification seen on CT of chest with severe persistent allergic asthma.  Assessment & Plan    -Changed carvedilol, beta-blocker over to diltiazem. -Okay to continue with Lasix, 3.6 L out yesterday.  Creatinine stable 0.78. If fluid status remains on conundrum after further diuresis, could consider right heart catheterization.    For questions or updates, please contact [place] Please consult [email or web address] for contact info under        Signed, Candee Furbish, MD  10/16/2021, 3:16 PM

## 2021-10-16 NOTE — Inpatient Diabetes Management (Signed)
Inpatient Diabetes Program Recommendations  AACE/ADA: New Consensus Statement on Inpatient Glycemic Control (2015)  Target Ranges:  Prepandial:   less than 140 mg/dL      Peak postprandial:   less than 180 mg/dL (1-2 hours)      Critically ill patients:  140 - 180 mg/dL    Latest Reference Range & Units 05/24/21 01:56  Hemoglobin A1C 4.8 - 5.6 % 5.9 (H)  (H): Data is abnormally high  Latest Reference Range & Units 10/15/21 07:43 10/15/21 12:29 10/15/21 16:17 10/15/21 22:22  Glucose-Capillary 70 - 99 mg/dL 199 (H) 221 (H) 256 (H) 210 (H)  (H): Data is abnormally high     History: Pre-Diabetes  Home DM Meds: Ozempic 1 mg Qweek  Current Orders: CBG Checks TID AC+ HS     MD- Note patient getting Solumedrol 60 mg BID  CBGs >180 mg/dl  Please consider starting Novolog Sensitive Correction Scale/ SSI (0-9 units) TID AC + HS    --Will follow patient during hospitalization--  Wyn Quaker RN, MSN, CDE Diabetes Coordinator Inpatient Glycemic Control Team Team Pager: [phone] (8a-5p)

## 2021-10-17 DIAGNOSIS — J9601 Acute respiratory failure with hypoxia: Secondary | ICD-10-CM | POA: Diagnosis not present

## 2021-10-17 DIAGNOSIS — I5033 Acute on chronic diastolic (congestive) heart failure: Secondary | ICD-10-CM | POA: Diagnosis not present

## 2021-10-17 DIAGNOSIS — I480 Paroxysmal atrial fibrillation: Secondary | ICD-10-CM | POA: Diagnosis not present

## 2021-10-17 DIAGNOSIS — J4541 Moderate persistent asthma with (acute) exacerbation: Secondary | ICD-10-CM | POA: Diagnosis not present

## 2021-10-17 DIAGNOSIS — I5032 Chronic diastolic (congestive) heart failure: Secondary | ICD-10-CM | POA: Diagnosis not present

## 2021-10-17 DIAGNOSIS — J9621 Acute and chronic respiratory failure with hypoxia: Secondary | ICD-10-CM | POA: Diagnosis not present

## 2021-10-17 LAB — GLUCOSE, CAPILLARY
Glucose-Capillary: 194 mg/dL — ABNORMAL HIGH (ref 70–99)
Glucose-Capillary: 405 mg/dL — ABNORMAL HIGH (ref 70–99)
Glucose-Capillary: 199 mg/dL — ABNORMAL HIGH (ref 70–99)
Glucose-Capillary: 255 mg/dL — ABNORMAL HIGH (ref 70–99)

## 2021-10-17 LAB — BASIC METABOLIC PANEL
Anion gap: 10 (ref 5–15)
BUN: 26 mg/dL — ABNORMAL HIGH (ref 6–20)
CO2: 27 mmol/L (ref 22–32)
Calcium: 8.3 mg/dL — ABNORMAL LOW (ref 8.9–10.3)
Chloride: 100 mmol/L (ref 98–111)
Creatinine, Ser: 0.73 mg/dL (ref 0.44–1.00)
GFR, Estimated: >60 mL/min (ref >60)
Glucose, Bld: 227 mg/dL — ABNORMAL HIGH (ref 70–99)
Potassium: 4.2 mmol/L (ref 3.5–5.1)
Sodium: 137 mmol/L (ref 135–145)

## 2021-10-17 LAB — CBC WITH DIFFERENTIAL/PLATELET
Abs Immature Granulocytes: 0.61 10*3/uL — ABNORMAL HIGH (ref 0.00–0.07)
Basophils Absolute: 0 10*3/uL (ref 0.0–0.1)
Basophils Relative: 0 %
Eosinophils Absolute: 0 10*3/uL (ref 0.0–0.5)
Eosinophils Relative: 0 %
HCT: 35.1 % — ABNORMAL LOW (ref 36.0–46.0)
Hemoglobin: 10.6 g/dL — ABNORMAL LOW (ref 12.0–15.0)
Immature Granulocytes: 3 %
Lymphocytes Relative: 8 %
Lymphs Abs: 1.7 10*3/uL (ref 0.7–4.0)
MCH: 24.7 pg — ABNORMAL LOW (ref 26.0–34.0)
MCHC: 30.2 g/dL (ref 30.0–36.0)
MCV: 81.8 fL (ref 80.0–100.0)
Monocytes Absolute: 1.4 10*3/uL — ABNORMAL HIGH (ref 0.1–1.0)
Monocytes Relative: 6 %
Neutro Abs: 19.2 10*3/uL — ABNORMAL HIGH (ref 1.7–7.7)
Neutrophils Relative %: 83 %
Platelets: 410 10*3/uL — ABNORMAL HIGH (ref 150–400)
RBC: 4.29 MIL/uL (ref 3.87–5.11)
RDW: 18.2 % — ABNORMAL HIGH (ref 11.5–15.5)
WBC: 23 10*3/uL — ABNORMAL HIGH (ref 4.0–10.5)
nRBC: 0 % (ref 0.0–0.2)

## 2021-10-17 LAB — PHOSPHORUS: Phosphorus: 3.9 mg/dL (ref 2.5–4.6)

## 2021-10-17 LAB — MAGNESIUM: Magnesium: 2.3 mg/dL (ref 1.7–2.4)

## 2021-10-17 MED ORDER — FUROSEMIDE 10 MG/ML IJ SOLN
40.0000 mg | Freq: Once | INTRAMUSCULAR | Status: AC
  Administered 2021-10-17: 40 mg via INTRAVENOUS
  Filled 2021-10-17: qty 4

## 2021-10-17 MED ORDER — PREDNISONE 20 MG PO TABS
40.0000 mg | ORAL_TABLET | Freq: Every day | ORAL | Status: DC
  Administered 2021-10-18: 40 mg via ORAL
  Filled 2021-10-17: qty 2

## 2021-10-17 MED ORDER — SIMETHICONE 80 MG PO CHEW
80.0000 mg | CHEWABLE_TABLET | Freq: Once | ORAL | Status: DC
Start: 2021-10-18 — End: 2021-10-18

## 2021-10-17 NOTE — Inpatient Diabetes Management (Signed)
Inpatient Diabetes Program Recommendations  AACE/ADA: New Consensus Statement on Inpatient Glycemic Control (2015)  Target Ranges:  Prepandial:   less than 140 mg/dL      Peak postprandial:   less than 180 mg/dL (1-2 hours)      Critically ill patients:  140 - 180 mg/dL   Lab Results  Component Value Date   GLUCAP 194 (H) 10/17/2021   HGBA1C 5.9 (H) 05/24/2021    Review of Glycemic Control  Latest Reference Range & Units 10/16/21 07:25 10/16/21 11:49 10/16/21 17:35 10/16/21 21:38 10/17/21 07:23  Glucose-Capillary 70 - 99 mg/dL 193 (H) 173 (H) 180 (H) 224 (H) 194 (H)  (H): Data is abnormally high History: Pre-Diabetes   Home DM Meds: Ozempic 1 mg Qweek   Current Orders: CBG Checks TID AC+ HS      MD- Note patient getting Solumedrol 60 mg BID   CBGs >180 mg/dl   Please consider starting Novolog Sensitive Correction Scale/ SSI (0-9 units) TID AC + HS  Thanks, Bronson Curb, MSN, RNC-OB Diabetes Coordinator [phone] (8a-5p)

## 2021-10-17 NOTE — Progress Notes (Addendum)
PROGRESS NOTE    April [name]  BJS:[id number] DOB: 1970-06-07 DOA: 10/08/2021 PCP: [name], MD     Brief Narrative:   asthma, COPD, or A-fib Underwent DCCV on 05/30/2017 on Eliquis, CHF, GERD, obesity,     Presented with chest pain shortness of breath asthma exacerbation  Subjective:  Wants regular diet Denies chest pain Diuresing well  Assessment & Plan:  Active Problems:   Severe persistent asthma with acute exacerbation in adult   HTN (hypertension)   AF (paroxysmal atrial fibrillation) (HCC)   Acute respiratory failure (HCC)   Chronic heart failure with preserved ejection fraction (HFpEF) (HCC)   Hypothyroidism   Hypokalemia   Hyperlipidemia   GERD (gastroesophageal reflux disease)   Asthma exacerbation   Body mass index 50.0-59.9, adult (HCC)    Assessment and Plan:  Acute on chronic hypoxic respiratory failure -Likely multifactorial including asthma exacerbation, acute on chronic diastolic CHF, also has OSA -Improving, wean oxygen  Acute asthma exacerbation -Improving, taper steroid -Appreciate pulmonology input  Acute on chronic diastolic CHF Responding to diuresis Appreciate cardiology input  History of PAF Sinus rhythm in the hospital Seen by cardiology Currently on Cardizem, Eliquis  Hypertension Blood pressure stable on current regimen   Hyperlipidemia Continue Lipitor Hypothyroidism: Continue with Synthroid. Leukocytosis; Secondary to steroids. Afebrile.  Hypophosphatemia; Replaced Elevated blood glucose, likely due to steroid   Class III obesity: Body mass index is 54.36 kg/m.[name] Lifestyle modification OSA patient agreed to try CPAP tonight .     I have Reviewed nursing notes, Vitals, pain scores, I/o's, Lab results and  imaging results since pt's last encounter, details please see discussion above  I ordered the following labs:  Unresulted Labs (From admission, onward)     Start     Ordered   10/14/21 1602  Urine drugs  of abuse scrn w alc, routine (Ref Lab)  Once,   R        10/14/21 1601   10/10/21 0500  Magnesium  Daily,   R     Question:  Specimen collection method  Answer:  Lab=Lab collect   10/09/21 0757   10/10/21 0500  Phosphorus  Daily,   R     Question:  Specimen collection method  Answer:  Lab=Lab collect   10/09/21 0757   10/10/21 0500  CBC with Differential/Platelet  Daily,   R     Question:  Specimen collection method  Answer:  Lab=Lab collect   10/09/21 0757             DVT prophylaxis:  apixaban (ELIQUIS) tablet 5 mg   Code Status:   Code Status: Full Code  Family Communication: patient Disposition:    Dispo: The patient is from: home              Anticipated d/c is to: home              Anticipated d/c date is: wean o2, home on 6/22 If continue to improve  Antimicrobials:    Anti-infectives (From admission, onward)    Start     Dose/Rate Route Frequency Ordered Stop   10/11/21 1300  azithromycin (ZITHROMAX) tablet 500 mg        500 mg Oral Daily 10/11/21 1208 10/15/21 1023          Objective: Vitals:   10/16/21 2142 10/17/21 0528 10/17/21 0542 10/17/21 0908  BP: (!) 155/86 128/67    Pulse: 76 71    Resp: (!) 22 14  Temp: 97.8 F (36.6 C) 98.6 F (37 C)    TempSrc:  Oral    SpO2: 100% 100%  100%  Weight:   (!) 152.8 kg   Height:        Intake/Output Summary (Last 24 hours) at 10/17/2021 1350 Last data filed at 10/17/2021 1200 Gross per 24 hour  Intake 600 ml  Output 2500 ml  Net -1900 ml   Filed Weights   10/15/21 0500 10/16/21 0351 10/17/21 0542  Weight: (!) 155.6 kg (!) 156 kg (!) 152.8 kg    Examination:  General exam: alert, awake, communicative,calm, NAD Respiratory system: Clear to auscultation. Respiratory effort normal. Cardiovascular system:  RRR.  Gastrointestinal system: Abdomen is nondistended, soft and nontender.  Normal bowel sounds heard. Central nervous system: Alert and oriented. No focal neurological  deficits. Extremities:  no edema Skin: No rashes, lesions or ulcers Psychiatry: Judgement and insight appear normal. Mood & affect appropriate.     Data Reviewed: I have personally reviewed  labs and visualized  imaging studies since the last encounter and formulate the plan        Scheduled Meds:  amLODipine  10 mg Oral Daily   apixaban  5 mg Oral BID   arformoterol  15 mcg Nebulization BID   atorvastatin  20 mg Oral q morning   cloNIDine  0.2 mg Oral BID   dextromethorphan-guaiFENesin  1 tablet Oral BID   diltiazem  120 mg Oral Daily   DULoxetine  60 mg Oral Daily   furosemide  40 mg Oral Daily   levothyroxine  50 mcg Oral Daily   loratadine  10 mg Oral Daily   mouth rinse  15 mL Mouth Rinse BID   methylPREDNISolone (SOLU-MEDROL) injection  60 mg Intravenous Q12H   montelukast  10 mg Oral QHS   pantoprazole  40 mg Oral BID   phosphorus  250 mg Oral BID   [START ON 10/18/2021] predniSONE  40 mg Oral Q breakfast   pregabalin  300 mg Oral BID   revefenacin  175 mcg Nebulization Daily   sodium chloride flush  3 mL Intravenous Q12H   topiramate  200 mg Oral QHS   Continuous Infusions:  sodium chloride Stopped (10/10/21 0836)     LOS: 9 days     Florencia Reasons, MD PhD FACP [facility]  Available via Epic secure chat 7am-7pm for nonurgent issues Please page for urgent issues To page the attending provider between 7A-7P or the covering provider during after hours 7P-7A, please log into the web site [email or web address] and access using universal [facility] password for that web site. If you do not have the password, please call the hospital operator.    10/17/2021, 1:50 PM

## 2021-10-17 NOTE — Progress Notes (Signed)
NAME:  April [name], MRN:  [id number], DOB:  1971/02/28, LOS: 9 ADMISSION DATE:  10/08/2021, CONSULTATION DATE:  10/13/21 REFERRING MD:  Dr Marjo Bicker, CHIEF COMPLAINT:  AE-asthma failure to improved   BRIEF  51 year old morbidly obese female on 4 L of oxygen on account of obesity sleep apnea and also severe asthma.  She uses oxygen continuously at night and intermittently based on subjective needs in the daytime follows with Dr. Baird Lyons.  In 2019 she is status post tracheostomy at [facility].  She is also noted to be on Dupixent.  History is complicated by noncompliance.  She is also on biologic test prior Singulair.  She has other medical problems.  Multiple hospitalizations including most recent May 2023.  This was for asthma and acute on chronic diastolic dysfunction.  She has hypertension noticed to be on multiple medications including amlodipine Coreg and clonidine.  She is sleep apnea for which she uses CPAP.  She has morbid obesity with a BMI of 53.  Anxiety for which she is on Klonopin.  She has chronic pain as well.  She is on Eliquis for atrial fibrillation  She was admitted 10/08/2021 with shortness of breath and wheezing and chest discomfort.  No fevers or chills.  We will call because of failure to improve she is actually states she is somewhat better.  She continues to be on 4-5 L nasal cannula.  She says that a week or 2 before admission she had a roommate who had a dog and she cleaned the dirty room.  She believes this may have triggered her current illness.  Here she is on IV Solu-Medrol Singulair Breo and Incruse oxygen.  Noticed that she is also on her carvedilol for atrial fibrillation and hypertension.  According to the hospitalist she is not better but according to the patient she is some better.  Last echocardiogram January 2023.   Past Medical History:    has a past medical history of Acanthosis nigricans, Anxiety, Arthritis, Asthma, Back pain, Chronic diastolic CHF  (congestive heart failure) ([place]) (01/17/2017), COPD (chronic obstructive pulmonary disease) ([place]), Depression, GERD (gastroesophageal reflux disease), EZMOQHUT(654.6), Helicobacter pylori (H. pylori) infection, Hypertension, essential, Insomnia, Joint pain, Lower extremity edema, Menorrhagia, Morbid obesity ([place]), OSA on CPAP, Pneumonia (X 1), Prediabetes, Rheumatoid arthritis ([place]), Seasonal allergies, Shortness of breath, Tobacco user, and Vitamin D deficiency.   has a past surgical history that includes Tubal ligation (1996); Reduction mammaplasty (Bilateral, 09/2011); and Cardioversion (N/A, 05/30/2017).    Significant Hospital Events:  10/08/2021 - admit 6/17 - ccm consult  SUBJECTIVE/OVERNIGHT/INTERVAL HX  Work of breathing is a little worse today. She didn't sleep well last night and dyspnea is usually worse in the mornings. She is concerned about her sleep and is requesting ativan.   Objective   Blood pressure 128/67, pulse 71, temperature 98.6 F (37 C), temperature source Oral, resp. rate 14, height '5\' 6"'$  (1.676 m), weight (!) 152.8 kg, SpO2 100 %.    FiO2 (%):  [36 %] 36 %   Intake/Output Summary (Last 24 hours) at 10/17/2021 1117 Last data filed at 10/17/2021 1000 Gross per 24 hour  Intake 720 ml  Output 2600 ml  Net -1880 ml    Filed Weights   10/15/21 0500 10/16/21 0351 10/17/21 0542  Weight: (!) 155.6 kg (!) 156 kg (!) 152.8 kg    Examination: General: Chronically ill appearing obese female in mild respiratory distress.  HENT: [place]/AT, PERRL, unable to appreciate JVD Lungs: Diminished bases.  Cardiovascular: RRR, no MRG Abdomen: Obese, soft, non-distended.  Extremities: Trace edema.  Neuro: Alert, oriented, non-focal  HRCT chest no obstructing left-sided airway lesions to explain unilateral wheezing, no infiltrates.  Mild basilar linear scars.  IgE 354  Resolved Hospital Problem list     Assessment & Plan:    Acute asthma exacerbation; severe persistent  allergicasthma Acute on chronic respiratory failure with hypoxia Morbid obesity baseline sleep apnea No CT evidence to support ABPA; IgE~350 - does not use cPAP, uses only 5L [place] at home - Is willing to try CPAP will place order - Diuresis as below -Supplemental oxygen as required to maintain SpO2 greater than 88% - This morning sats are 100% on 4L, can be weaned.  - Transition solumedrol to prednisone 40 with plans to taper.  - Changed from short acting bronchodilators to [place] and Yupelri -Continue montelukast  -Add Claritin -Needs ambulatory desaturation screening prior to discharge -Continue Mucinex  HFpEF HTN   Mild Coronary artercy calcification on CT chest Jan 2023 - Continue diuresis as renal function will tolerate.  - Appreciate cardiology's input  Rest of care per primary.  We will follow-up later this week to monitor her progress.  In the interim please call with questions.  LABS    PULMONARY No results for input(s): "PHART", "PCO2ART", "PO2ART", "HCO3", "TCO2", "O2SAT" in the last 168 hours.  Invalid input(s): "PCO2", "PO2"   CBC Recent Labs  Lab 10/15/21 0437 10/16/21 0506 10/17/21 0614  HGB 10.5* 10.7* 10.6*  HCT 35.2* 35.6* 35.1*  WBC 19.9* 24.6* 23.0*  PLT 415* 416* 410*      Georgann Housekeeper, AGACNP-BC [facility]  See Amion for personal pager PCCM on call pager [phone] until 7pm. Please call Elink 7p-7a. [phone]  10/17/2021 11:23 AM

## 2021-10-17 NOTE — Progress Notes (Addendum)
Progress Note  Patient Name: April [name] Date of Encounter: 10/17/2021  [facility] Cardiologist: Sanda Klein, MD   Subjective   Patient denies any chest pain or palpitations today. Breathing has improved, but she continues to have occasional SOB. Denies orthopnea    Inpatient Medications    Scheduled Meds:  amLODipine  10 mg Oral Daily   apixaban  5 mg Oral BID   arformoterol  15 mcg Nebulization BID   atorvastatin  20 mg Oral q morning   cloNIDine  0.2 mg Oral BID   dextromethorphan-guaiFENesin  1 tablet Oral BID   diltiazem  120 mg Oral Daily   DULoxetine  60 mg Oral Daily   furosemide  40 mg Oral Daily   levothyroxine  50 mcg Oral Daily   loratadine  10 mg Oral Daily   mouth rinse  15 mL Mouth Rinse BID   methylPREDNISolone (SOLU-MEDROL) injection  60 mg Intravenous Q12H   montelukast  10 mg Oral QHS   pantoprazole  40 mg Oral BID   phosphorus  250 mg Oral BID   [START ON 10/18/2021] predniSONE  40 mg Oral Q breakfast   pregabalin  300 mg Oral BID   revefenacin  175 mcg Nebulization Daily   sodium chloride flush  3 mL Intravenous Q12H   topiramate  200 mg Oral QHS   Continuous Infusions:  sodium chloride Stopped (10/10/21 0836)   PRN Meds: sodium chloride, acetaminophen **OR** acetaminophen, alum & mag hydroxide-simeth, clonazePAM, hydrALAZINE, HYDROcodone-acetaminophen, levalbuterol, sodium chloride flush, zolpidem   Vital Signs    Vitals:   10/16/21 2142 10/17/21 0528 10/17/21 0542 10/17/21 0908  BP: (!) 155/86 128/67    Pulse: 76 71    Resp: (!) 22 14    Temp: 97.8 F (36.6 C) 98.6 F (37 C)    TempSrc:  Oral    SpO2: 100% 100%  100%  Weight:   (!) 152.8 kg   Height:        Intake/Output Summary (Last 24 hours) at 10/17/2021 1155 Last data filed at 10/17/2021 1100 Gross per 24 hour  Intake 720 ml  Output 3300 ml  Net -2580 ml      10/17/2021    5:42 AM 10/16/2021    3:51 AM 10/15/2021    5:00 AM  Last 3 Weights  Weight (lbs) 336 lb  12.8 oz 344 lb 343 lb  Weight (kg) 152.771 kg 156.037 kg 155.584 kg      Telemetry    Sinus rhythm, HR in the 80s-100s - Personally Reviewed  ECG    No new tracings - Personally Reviewed  Physical Exam   GEN: No acute distress. Sitting upright at that side of the bed eating lunch  Neck: No JVD Cardiac: RRR, no murmurs, rubs, or gallops.  Respiratory: Diminished breath sounds throughout lungs, otherwise clear to ausculation  GI: Soft, nontender, non-distended  MS: No edema; No deformity. Neuro:  Nonfocal  Psych: Normal affect   Labs    High Sensitivity Troponin:   Recent Labs  Lab 10/08/21 1802 10/08/21 2009  TROPONINIHS 7 7     Chemistry Recent Labs  Lab 10/12/21 0510 10/13/21 0543 10/14/21 0535 10/15/21 0437 10/16/21 0506 10/17/21 0614  NA 138 137 138 136 136 137  K 3.6 3.9 3.9 4.0 3.7 4.2  CL 102 101 99 98 98 100  CO2 27 30 32 '28 28 27  '$ GLUCOSE 244* 162* 170* 290* 223* 227*  BUN 22* 22* 24* 30* 25*  26*  CREATININE 0.77 0.63 0.87 0.98 0.78 0.73  CALCIUM 8.1* 7.8* 8.1* 7.7* 8.2* 8.3*  MG 2.5* 2.2 2.5* 2.1 2.2 2.3  PROT 6.7 6.1* 6.4*  --   --   --   ALBUMIN 3.2* 2.9* 3.1*  --   --   --   AST '18 15 17  '$ --   --   --   ALT 24 23 32  --   --   --   ALKPHOS 64 59 65  --   --   --   BILITOT 0.3 0.4 0.3  --   --   --   GFRNONAA >60 >60 >60 >60 >60 >60  ANIONGAP '9 6 7 10 10 10    '$ Lipids  Recent Labs  Lab 10/16/21 0506  CHOL 192  TRIG 51  HDL 99  LDLCALC 83  CHOLHDL 1.9    Hematology Recent Labs  Lab 10/15/21 0437 10/16/21 0506 10/17/21 0614  WBC 19.9* 24.6* 23.0*  RBC 4.24 4.32 4.29  HGB 10.5* 10.7* 10.6*  HCT 35.2* 35.6* 35.1*  MCV 83.0 82.4 81.8  MCH 24.8* 24.8* 24.7*  MCHC 29.8* 30.1 30.2  RDW 18.4* 18.1* 18.2*  PLT 415* 416* 410*   Thyroid No results for input(s): "TSH", "FREET4" in the last 168 hours.  BNP Recent Labs  Lab 10/13/21 1253  BNP 66.3    DDimer No results for input(s): "DDIMER" in the last 168 hours.   Radiology     ECHOCARDIOGRAM COMPLETE  Result Date: 10/15/2021    ECHOCARDIOGRAM REPORT   Patient Name:   April [name] Date of Exam: 10/15/2021 Medical Rec #:  [id number]    Height:       66.0 in Accession #:    [id number]   Weight:       343.0 lb Date of Birth:  1970-08-27     BSA:          2.515 m Patient Age:    51 years     BP:           145/69 mmHg Patient Gender: F            HR:           78 bpm. Exam Location:  Inpatient Procedure: 2D Echo, Cardiac Doppler and Color Doppler Indications:    CHF  History:        Patient has prior history of Echocardiogram examinations, most                 recent 05/25/2021.  Sonographer:    Joette Catching RCS Referring Phys: [place]  1. Left ventricular ejection fraction, by estimation, is 60 to 65%. The left ventricle has normal function. The left ventricle has no regional wall motion abnormalities. There is moderate left ventricular hypertrophy. Left ventricular diastolic parameters are consistent with Grade I diastolic dysfunction (impaired relaxation).  2. Right ventricular systolic function is normal. The right ventricular size is normal.  3. The mitral valve is normal in structure. Trivial mitral valve regurgitation. No evidence of mitral stenosis.  4. The aortic valve is normal in structure. Aortic valve regurgitation is not visualized. No aortic stenosis is present.  5. The inferior vena cava is normal in size with greater than 50% respiratory variability, suggesting right atrial pressure of 3 mmHg. FINDINGS  Left Ventricle: Left ventricular ejection fraction, by estimation, is 60 to 65%. The left ventricle has normal function. The left ventricle has no regional wall  motion abnormalities. The left ventricular internal cavity size was normal in size. There is  moderate left ventricular hypertrophy. Left ventricular diastolic parameters are consistent with Grade I diastolic dysfunction (impaired relaxation). Right Ventricle: The right ventricular size is  normal. No increase in right ventricular wall thickness. Right ventricular systolic function is normal. Left Atrium: Left atrial size was normal in size. Right Atrium: Right atrial size was normal in size. Pericardium: There is no evidence of pericardial effusion. Mitral Valve: The mitral valve is normal in structure. Trivial mitral valve regurgitation. No evidence of mitral valve stenosis. Tricuspid Valve: The tricuspid valve is normal in structure. Tricuspid valve regurgitation is not demonstrated. No evidence of tricuspid stenosis. Aortic Valve: The aortic valve is normal in structure. Aortic valve regurgitation is not visualized. No aortic stenosis is present. Aortic valve mean gradient measures 6.0 mmHg. Aortic valve peak gradient measures 11.7 mmHg. Aortic valve area, by VTI measures 3.19 cm. Pulmonic Valve: The pulmonic valve was normal in structure. Pulmonic valve regurgitation is not visualized. No evidence of pulmonic stenosis. Aorta: The aortic root is normal in size and structure. Venous: The inferior vena cava is normal in size with greater than 50% respiratory variability, suggesting right atrial pressure of 3 mmHg. IAS/Shunts: No atrial level shunt detected by color flow Doppler.  LEFT VENTRICLE PLAX 2D LVIDd:         5.30 cm   Diastology LVIDs:         3.60 cm   LV e' medial:    8.92 cm/s LV PW:         1.40 cm   LV E/e' medial:  12.2 LV IVS:        1.50 cm   LV e' lateral:   8.38 cm/s LVOT diam:     2.30 cm   LV E/e' lateral: 13.0 LV SV:         103 LV SV Index:   41 LVOT Area:     4.15 cm  RIGHT VENTRICLE             IVC RV Basal diam:  3.50 cm     IVC diam: 2.00 cm RV Mid diam:    2.60 cm RV S prime:     17.70 cm/s LEFT ATRIUM             Index        RIGHT ATRIUM           Index LA diam:        3.80 cm 1.51 cm/m   RA Area:     12.60 cm LA Vol (A2C):   43.3 ml 17.22 ml/m  RA Volume:   28.60 ml  11.37 ml/m LA Vol (A4C):   41.9 ml 16.66 ml/m LA Biplane Vol: 44.8 ml 17.81 ml/m  AORTIC VALVE                      PULMONIC VALVE AV Area (Vmax):    3.23 cm      PV Vmax:          1.57 m/s AV Area (Vmean):   3.21 cm      PV Peak grad:     9.9 mmHg AV Area (VTI):     3.19 cm      PR End Diast Vel: 3.41 msec AV Vmax:           171.00 cm/s AV Vmean:          114.000  cm/s AV VTI:            0.324 m AV Peak Grad:      11.7 mmHg AV Mean Grad:      6.0 mmHg LVOT Vmax:         133.00 cm/s LVOT Vmean:        88.100 cm/s LVOT VTI:          0.249 m LVOT/AV VTI ratio: 0.77  AORTA Ao Root diam: 3.00 cm Ao Asc diam:  2.90 cm MITRAL VALVE                TRICUSPID VALVE MV Area (PHT): 3.60 cm     TR Peak grad:   16.2 mmHg MV Decel Time: 211 msec     TR Vmax:        201.00 cm/s MV E velocity: 109.00 cm/s MV A velocity: 107.00 cm/s  SHUNTS MV E/A ratio:  1.02         Systemic VTI:  0.25 m                             Systemic Diam: 2.30 cm Glori Bickers MD Electronically signed by Glori Bickers MD Signature Date/Time: 10/15/2021/4:13:50 PM    Final     Cardiac Studies   Echocardiogram 10/15/21   1. Left ventricular ejection fraction, by estimation, is 60 to 65%. The  left ventricle has normal function. The left ventricle has no regional  wall motion abnormalities. There is moderate left ventricular hypertrophy.  Left ventricular diastolic  parameters are consistent with Grade I diastolic dysfunction (impaired  relaxation).   2. Right ventricular systolic function is normal. The right ventricular  size is normal.   3. The mitral valve is normal in structure. Trivial mitral valve  regurgitation. No evidence of mitral stenosis.   4. The aortic valve is normal in structure. Aortic valve regurgitation is  not visualized. No aortic stenosis is present.   5. The inferior vena cava is normal in size with greater than 50%  respiratory variability, suggesting right atrial pressure of 3 mmHg.   Patient Profile     51 y.o. female  with a hx of asthma, COPD, atrial fibrillation on eliquis, diastolic CHF, GERD,  obesity who is being seen for the evaluation of SOB at the request of Dr. Tyrell Antonio.  Assessment & Plan   Chronic diastolic heart failure  Acute on Chronic Hypoxic Respiratory failure  Asthma with acute exacerbation  - Echo this admission showed EF 70-35%, grade I diastolic dysfunction  - Continue lasix 40 mg PO daily. Also received 40 mg IV yesterday-- currently net -3 L since admission. Patient did admit noncompliance with lasix PTA   - Renal function stable on lasix.  - Continue inhalers/nebs, steroids for asthma treatment  - Pulmonology consulting. Patient's symptoms are improving with interventions  - Can consider RHC if patient continues to have some SOB after completing diuresis (when renal function bumps)    Paroxysmal Atrial fibrillation  - Patient was transitioned from BB to cardizem 120 mg daily this admission  - Continue eliquis 5 mg BID  - Maintaining sinus rhythm per telemetry, HR in the 80s-100s    Chest Pain  - On presentation, patient complained of occasional chest pain. Pain sounds atypical (feels like a "pounding", occurs randomly and when at rest, relieved by trying to calm herself down) - hsTn negative x2  - CT scan did show moderate  coronary calcifications of the LAD, however I do not think that this is causing her chest pain as it is atypical - Patient has not had chest pain since presentation  - Continue lipitor 20 mg daily  - Not on BB due to asthma     HTN  - continue amlodipine, catapres, lasix, cardizem    Otherwise per primary  - Severe persistent asthma with acute exacerbation  - GERD       For questions or updates, please contact [facility] Please consult [email or web address] for contact info under        Signed, Margie Billet, PA-C  10/17/2021, 11:55 AM    Personally seen and examined. Agree with above.  Feeling better Asked if today she can go home? Would recommend lasix '40mg'$  per day. Compliance.  Discussed with Dr. Alfonzo Feller, MD

## 2021-10-17 NOTE — Progress Notes (Signed)
SATURATION QUALIFICATIONS: (This note is used to comply with regulatory documentation for home oxygen)  Patient Saturations on Room Air at Rest = 99%  Patient Saturations on Room Air while Ambulating = 96%   Please briefly explain why patient needs home oxygen:  Patient oxygen saturations maintained 96-99% while ambulating on RA ~200 feet.

## 2021-10-18 DIAGNOSIS — J4551 Severe persistent asthma with (acute) exacerbation: Secondary | ICD-10-CM | POA: Diagnosis not present

## 2021-10-18 DIAGNOSIS — Z6841 Body Mass Index (BMI) 40.0 and over, adult: Secondary | ICD-10-CM | POA: Diagnosis not present

## 2021-10-18 DIAGNOSIS — I480 Paroxysmal atrial fibrillation: Secondary | ICD-10-CM | POA: Diagnosis not present

## 2021-10-18 DIAGNOSIS — J9601 Acute respiratory failure with hypoxia: Secondary | ICD-10-CM | POA: Diagnosis not present

## 2021-10-18 LAB — CBC WITH DIFFERENTIAL/PLATELET
Abs Immature Granulocytes: 1.12 10*3/uL — ABNORMAL HIGH (ref 0.00–0.07)
Basophils Absolute: 0.1 10*3/uL (ref 0.0–0.1)
Basophils Relative: 0 %
Eosinophils Absolute: 0 10*3/uL (ref 0.0–0.5)
Eosinophils Relative: 0 %
HCT: 34.7 % — ABNORMAL LOW (ref 36.0–46.0)
Hemoglobin: 10.4 g/dL — ABNORMAL LOW (ref 12.0–15.0)
Immature Granulocytes: 5 %
Lymphocytes Relative: 7 %
Lymphs Abs: 1.6 10*3/uL (ref 0.7–4.0)
MCH: 24.5 pg — ABNORMAL LOW (ref 26.0–34.0)
MCHC: 30 g/dL (ref 30.0–36.0)
MCV: 81.6 fL (ref 80.0–100.0)
Monocytes Absolute: 1.3 10*3/uL — ABNORMAL HIGH (ref 0.1–1.0)
Monocytes Relative: 6 %
Neutro Abs: 19.1 10*3/uL — ABNORMAL HIGH (ref 1.7–7.7)
Neutrophils Relative %: 82 %
Platelets: 404 10*3/uL — ABNORMAL HIGH (ref 150–400)
RBC: 4.25 MIL/uL (ref 3.87–5.11)
RDW: 18.2 % — ABNORMAL HIGH (ref 11.5–15.5)
WBC: 23.2 10*3/uL — ABNORMAL HIGH (ref 4.0–10.5)
nRBC: 0 % (ref 0.0–0.2)

## 2021-10-18 LAB — GLUCOSE, CAPILLARY: Glucose-Capillary: 248 mg/dL — ABNORMAL HIGH (ref 70–99)

## 2021-10-18 LAB — PHOSPHORUS: Phosphorus: 4 mg/dL (ref 2.5–4.6)

## 2021-10-18 LAB — MAGNESIUM: Magnesium: 2.4 mg/dL (ref 1.7–2.4)

## 2021-10-18 MED ORDER — PREDNISONE 10 MG PO TABS
ORAL_TABLET | ORAL | 0 refills | Status: DC
Start: 2021-10-18 — End: 2021-10-26

## 2021-10-18 MED ORDER — ARFORMOTEROL TARTRATE 15 MCG/2ML IN NEBU: 15.0000 ug | INHALATION_SOLUTION | Freq: Two times a day (BID) | RESPIRATORY_TRACT | 0 refills | Status: DC

## 2021-10-18 MED ORDER — FUROSEMIDE 40 MG PO TABS: 40.0000 mg | ORAL_TABLET | Freq: Every day | ORAL | 6 refills | Status: DC

## 2021-10-18 MED ORDER — CLONAZEPAM 0.5 MG PO TABS: 0.5000 mg | ORAL_TABLET | Freq: Every day | ORAL | 0 refills | Status: DC | PRN

## 2021-10-18 MED ORDER — REVEFENACIN 175 MCG/3ML IN SOLN: 175.0000 ug | Freq: Every day | RESPIRATORY_TRACT | 0 refills | Status: DC

## 2021-10-18 MED ORDER — DILTIAZEM HCL ER COATED BEADS 120 MG PO CP24: 120.0000 mg | ORAL_CAPSULE | Freq: Every day | ORAL | 0 refills | Status: DC

## 2021-10-18 NOTE — Discharge Summary (Signed)
Discharge Summary  April [name]:[id number] DOB: October 20, 1970  PCP: [name], MD  Admit date: 10/08/2021 Discharge date: 10/18/2021  Time spent: 64mns, more than 50% time spent on coordination of care.   Recommendations for Outpatient Follow-up:  F/u with PCP within a week  for hospital discharge follow up, repeat cbc/bmp at follow up F/u with cardiology F/u with pulmonology   New meds: cardizem, prednisone taper, brovana and yupelri nebulizer solution Refill: lasix,  also give refill of klonopin per patient's request Discontinued meds: coreg, per cardiology recommendation   Discharge Diagnoses:  Active Hospital Problems   Diagnosis Date Noted   Severe persistent asthma with acute exacerbation in adult 08/23/2021    Priority: 1.   HTN (hypertension) 07/31/2006    Priority: 2.   Acute respiratory failure ([place] 02/20/2019    Priority: 3.   AF (paroxysmal atrial fibrillation) ([place]     Priority: 3.   Chronic heart failure with preserved ejection fraction (HFpEF) ([place] 08/23/2021    Priority: 4.   Hypothyroidism 08/23/2021    Priority: 5.   Body mass index 50.0-59.9, adult ([place] 07/04/2021   Asthma exacerbation 01/17/2017   GERD (gastroesophageal reflux disease) 08/30/2015   Hyperlipidemia 12/08/2013   Hypokalemia 08/13/2010    Resolved Hospital Problems  No resolved problems to display.    Discharge Condition: stable  Diet recommendation: heart healthy/carb modified (patient requested regular diet while in the hospital)  [facility]   10/16/21 0351 10/17/21 0542 10/18/21 0424  Weight: (!) 156 kg (!) 152.8 kg (!) 153.2 kg    History of present illness: ( per admitting MD Dr DRoel Cluck DOlin Piais a 51y.o. female with medical history significant of asthma, COPD, or A-fib on Eliquis, CHF, GERD, obesity,     Presented with chest pain shortness of breath asthma exacerbation Patient with known history of severe asthma COPD complaining of shortness of  breath and chest pain.  Happening for the past few days feels like asthma exacerbation.  She has been wheezing and using her inhalers and nebulizer but does not seem to help.  She went to her primary care provider and gotten an injection for her asthma.  Her breathing is worse with exertion Chest pain nonradiating. No associated fevers or chills She has been taking recent steroids for asthma exacerbation finished on Friday 3 days ago   Does not smoke no drinks last intubation for asthma was years ago  She uses 4 L of O2 at night does not use CPAP  Hospital Course:  Active Problems:   Severe persistent asthma with acute exacerbation in adult   HTN (hypertension)   AF (paroxysmal atrial fibrillation) (HCC)   Acute respiratory failure (HCC)   Chronic heart failure with preserved ejection fraction (HFpEF) (HCC)   Hypothyroidism   Hypokalemia   Hyperlipidemia   GERD (gastroesophageal reflux disease)   Asthma exacerbation   Body mass index 50.0-59.9, adult (HCC)   Assessment and Plan:  Acute on chronic hypoxic respiratory failure -Likely multifactorial including asthma exacerbation, acute on chronic diastolic CHF, also has OSA -Improved, she is weaned off oxygen (Patient oxygen saturations maintained 96-99% while ambulating on RA ~200 feet)   Acute asthma exacerbation -Improving, taper steroid -Appreciate pulmonology input   Acute on chronic diastolic CHF Responding to diuresis Appreciate cardiology input   History of PAF Sinus rhythm in the hospital Seen by cardiology Currently on Cardizem, Eliquis   Hypertension Blood pressure stable on current regimen     Hyperlipidemia Continue  Lipitor Hypothyroidism: Continue with Synthroid. Leukocytosis; Secondary to steroids. Afebrile.  Hypophosphatemia; Replaced Elevated blood glucose,  likely due to steroid, appear to be on ozempic at home, f/u with pcp     Class III obesity: Body mass index is 54.36 kg/m.[name] Lifestyle  modification OSA , she reports not able to tolerate CPAP , she does not always wear o2 at night either , f/u with pcp and pulmonology  .   Discharge Exam: BP (!) 157/94 (BP Location: Right Arm)   Pulse 82   Temp 98.4 F (36.9 C)   Resp 18   Ht _0  (1.676 m)   Wt (!) 153.2 kg   SpO2 98%   BMI 54.52 kg/m   General: NAD Cardiovascular: RRR Respiratory: normal respiratory effort     Discharge Instructions     Diet - low sodium heart healthy   Complete by: As directed    Carb modified  diet   Increase activity slowly   Complete by: As directed       Allergies as of 10/18/2021       Reactions   Contrast Media [iodinated Contrast Media] Itching   Ct contrast        Medication List     STOP taking these medications    carvedilol 6.25 MG tablet Commonly known as: COREG       TAKE these medications    albuterol 108 (90 Base) MCG/ACT inhaler Commonly known as: ProAir HFA Inhale 2 puffs into the lungs every 6 (six) hours as needed for wheezing or shortness of breath.   amLODipine 10 MG tablet Commonly known as: NORVASC TAKE 1 TABLET(10 MG) BY MOUTH DAILY What changed: See the new instructions.   apixaban 5 MG Tabs tablet Commonly known as: ELIQUIS Take 1 tablet (5 mg total) by mouth 2 (two) times daily.   arformoterol 15 MCG/2ML Nebu Commonly known as: BROVANA Take 2 mLs (15 mcg total) by nebulization 2 (two) times daily.   atorvastatin 20 MG tablet Commonly known as: LIPITOR Take 20 mg by mouth every morning.   baclofen 20 MG tablet Commonly known as: LIORESAL Take 20 mg by mouth daily as needed for muscle spasms (pain).   clonazePAM 0.5 MG tablet Commonly known as: KlonoPIN Take 1 tablet (0.5 mg total) by mouth daily as needed for anxiety.   cloNIDine 0.2 MG tablet Commonly known as: CATAPRES Take 0.2 mg by mouth 2 (two) times daily.   Diclofenac Sodium 3 % Gel Apply 1 application. topically daily as needed (pain).   diltiazem 120 MG  24 hr capsule Commonly known as: CARDIZEM CD Take 1 capsule (120 mg total) by mouth daily. Start taking on: October 19, 2021   DULoxetine 60 MG capsule Commonly known as: CYMBALTA Take 1 capsule (60 mg total) by mouth daily. What changed:  when to take this reasons to take this   EPINEPHrine 0.3 mg/0.3 mL Soaj injection Commonly known as: EPI-PEN Inject 0.3 mg into the muscle as needed for anaphylaxis.   fluticasone 50 MCG/ACT nasal spray Commonly known as: FLONASE SHAKE LIQUID AND USE 1 SPRAY IN EACH NOSTRIL DAILY AS NEEDED FOR ALLERGIES OR RHINITIS What changed: See the new instructions.   furosemide 40 MG tablet Commonly known as: LASIX Take 1 tablet (40 mg total) by mouth daily.   ipratropium-albuterol 0.5-2.5 (3) MG/3ML Soln Commonly known as: DUONEB Take 3 mLs by nebulization every 6 (six) hours as needed (shortness of breath/wheezing). What changed: Another medication with the same name  was removed. Continue taking this medication, and follow the directions you see here.   levothyroxine 50 MCG tablet Commonly known as: SYNTHROID Take 50 mcg by mouth daily.   montelukast 10 MG tablet Commonly known as: SINGULAIR Take 1 tablet (10 mg total) by mouth at bedtime.   naloxone 4 MG/0.1ML Liqd nasal spray kit Commonly known as: NARCAN Place 1 spray into the nose once as needed (opioid overdose).   omeprazole 40 MG capsule Commonly known as: PRILOSEC Take 40 mg by mouth daily.   oxyCODONE-acetaminophen 10-325 MG tablet Commonly known as: PERCOCET Take 1 tablet by mouth 5 (five) times daily as needed (nerve pain).   Ozempic (1 MG/DOSE) 4 MG/3ML Sopn Generic drug: Semaglutide (1 MG/DOSE) Inject 1 mg into the skin every Monday.   phentermine 15 MG capsule Take 15 mg by mouth daily.   potassium chloride SA 20 MEQ tablet Commonly known as: KLOR-CON M TAKE 1 TABLET( 20 MEQ TOTAL) BY MOUTH DAILY What changed: See the new instructions.   predniSONE 10 MG  tablet Commonly known as: DELTASONE Prednisone dosing: Take  Prednisone 42m (4 tabs) x 3 days, then taper to 331m(3 tabs) x 3 days, then 20102m2 tabs) x 3days, then 44m38m tab) x 3days, then OFF.   pregabalin 300 MG capsule Commonly known as: LYRICA Take 300 mg by mouth 2 (two) times daily.   revefenacin 175 MCG/3ML nebulizer solution Commonly known as: YUPELRI Take 3 mLs (175 mcg total) by nebulization daily. Start taking on: October 19, 2021   Tezspire 210 MG/1.91ML syringe Generic drug: tezepelumab-ekko Inject 210 mg into the skin every 28 (twenty-eight) days. What changed: Another medication with the same name was added. Make sure you understand how and when to take each.   Tezspire 210 MG/1.91ML Soaj Generic drug: Tezepelumab-ekko Inject 210 mg into the skin every 28 (twenty-eight) days. Courier to pulm: [id number] [place] [phone] on 11/05/21. What changed: You were already taking a medication with the same name, and this prescription was added. Make sure you understand how and when to take each.   topiramate 200 MG tablet Commonly known as: TOPAMAX Take 200 mg by mouth at bedtime.   Trelegy Ellipta 100-62.5-25 MCG/ACT Aepb Generic drug: Fluticasone-Umeclidin-Vilant Inhale 1 puff into the lungs daily.   zolpidem 10 MG tablet Commonly known as: AMBIEN TAKE 1 TABLET BY MOUTH AT BEDTIME AS NEEDED FOR SLEEP What changed:  how much to take how to take this when to take this additional instructions               Durable Medical Equipment  (From admission, onward)           Start     Ordered   10/15/21 1812  For home use only DME Nebulizer machine  Once       Question Answer Comment  Patient needs a nebulizer to treat with the following condition Asthma   Length of Need Lifetime      10/15/21 1811   10/15/21 1812  For home use only DME 4 wheeled rolling walker with seat  Once       Comments: Hypoxic respiratory failure.   Question:  Patient needs a walker to treat with the following condition  Answer:  Physical deconditioning   10/15/21 1812           Allergies  Allergen Reactions   Contrast Media [Iodinated Contrast Media] Itching    Ct contrast  Follow-up Information     [name], MD Follow up.   Specialty: Family Medicine Why: hospital discharge follow up, repeat cbc/bmp at follow up, pcp to monitor your blood glucose Contact information: [place] [address] [phone]         Sanda Klein, MD .   Specialty: Cardiology Contact information: [address] [place] [address] [phone]                  The results of significant diagnostics from this hospitalization (including imaging, microbiology, ancillary and laboratory) are listed below for reference.    Significant Diagnostic Studies: ECHOCARDIOGRAM COMPLETE  Result Date: 10/15/2021    ECHOCARDIOGRAM REPORT   Patient Name:   April [name] Date of Exam: 10/15/2021 Medical Rec #:  [id number]    Height:       66.0 in Accession #:    [id number]   Weight:       343.0 lb Date of Birth:  23-Aug-1970     BSA:          2.515 m Patient Age:    22 years     BP:           145/69 mmHg Patient Gender: F            HR:           78 bpm. Exam Location:  Inpatient Procedure: 2D Echo, Cardiac Doppler and Color Doppler Indications:    CHF  History:        Patient has prior history of Echocardiogram examinations, most                 recent 05/25/2021.  Sonographer:    Joette Catching RCS Referring Phys: [place]  1. Left ventricular ejection fraction, by estimation, is 60 to 65%. The left ventricle has normal function. The left ventricle has no regional wall motion abnormalities. There is moderate left ventricular hypertrophy. Left ventricular diastolic parameters are consistent with Grade I diastolic dysfunction (impaired relaxation).  2. Right ventricular  systolic function is normal. The right ventricular size is normal.  3. The mitral valve is normal in structure. Trivial mitral valve regurgitation. No evidence of mitral stenosis.  4. The aortic valve is normal in structure. Aortic valve regurgitation is not visualized. No aortic stenosis is present.  5. The inferior vena cava is normal in size with greater than 50% respiratory variability, suggesting right atrial pressure of 3 mmHg. FINDINGS  Left Ventricle: Left ventricular ejection fraction, by estimation, is 60 to 65%. The left ventricle has normal function. The left ventricle has no regional wall motion abnormalities. The left ventricular internal cavity size was normal in size. There is  moderate left ventricular hypertrophy. Left ventricular diastolic parameters are consistent with Grade I diastolic dysfunction (impaired relaxation). Right Ventricle: The right ventricular size is normal. No increase in right ventricular wall thickness. Right ventricular systolic function is normal. Left Atrium: Left atrial size was normal in size. Right Atrium: Right atrial size was normal in size. Pericardium: There is no evidence of pericardial effusion. Mitral Valve: The mitral valve is normal in structure. Trivial mitral valve regurgitation. No evidence of mitral valve stenosis. Tricuspid Valve: The tricuspid valve is normal in structure. Tricuspid valve regurgitation is not demonstrated. No evidence of tricuspid stenosis. Aortic Valve: The aortic valve is normal in structure. Aortic valve regurgitation is not visualized. No aortic stenosis is present. Aortic valve mean gradient  measures 6.0 mmHg. Aortic valve peak gradient measures 11.7 mmHg. Aortic valve area, by VTI measures 3.19 cm. Pulmonic Valve: The pulmonic valve was normal in structure. Pulmonic valve regurgitation is not visualized. No evidence of pulmonic stenosis. Aorta: The aortic root is normal in size and structure. Venous: The inferior vena cava is normal  in size with greater than 50% respiratory variability, suggesting right atrial pressure of 3 mmHg. IAS/Shunts: No atrial level shunt detected by color flow Doppler.  LEFT VENTRICLE PLAX 2D LVIDd:         5.30 cm   Diastology LVIDs:         3.60 cm   LV e' medial:    8.92 cm/s LV PW:         1.40 cm   LV E/e' medial:  12.2 LV IVS:        1.50 cm   LV e' lateral:   8.38 cm/s LVOT diam:     2.30 cm   LV E/e' lateral: 13.0 LV SV:         103 LV SV Index:   41 LVOT Area:     4.15 cm  RIGHT VENTRICLE             IVC RV Basal diam:  3.50 cm     IVC diam: 2.00 cm RV Mid diam:    2.60 cm RV S prime:     17.70 cm/s LEFT ATRIUM             Index        RIGHT ATRIUM           Index LA diam:        3.80 cm 1.51 cm/m   RA Area:     12.60 cm LA Vol (A2C):   43.3 ml 17.22 ml/m  RA Volume:   28.60 ml  11.37 ml/m LA Vol (A4C):   41.9 ml 16.66 ml/m LA Biplane Vol: 44.8 ml 17.81 ml/m  AORTIC VALVE                     PULMONIC VALVE AV Area (Vmax):    3.23 cm      PV Vmax:          1.57 m/s AV Area (Vmean):   3.21 cm      PV Peak grad:     9.9 mmHg AV Area (VTI):     3.19 cm      PR End Diast Vel: 3.41 msec AV Vmax:           171.00 cm/s AV Vmean:          114.000 cm/s AV VTI:            0.324 m AV Peak Grad:      11.7 mmHg AV Mean Grad:      6.0 mmHg LVOT Vmax:         133.00 cm/s LVOT Vmean:        88.100 cm/s LVOT VTI:          0.249 m LVOT/AV VTI ratio: 0.77  AORTA Ao Root diam: 3.00 cm Ao Asc diam:  2.90 cm MITRAL VALVE                TRICUSPID VALVE MV Area (PHT): 3.60 cm     TR Peak grad:   16.2 mmHg MV Decel Time: 211 msec     TR Vmax:        201.00  cm/s MV E velocity: 109.00 cm/s MV A velocity: 107.00 cm/s  SHUNTS MV E/A ratio:  1.02         Systemic VTI:  0.25 m                             Systemic Diam: 2.30 cm Glori Bickers MD Electronically signed by Glori Bickers MD Signature Date/Time: 10/15/2021/4:13:50 PM    Final    CT Chest High Resolution  Result Date: 10/14/2021 CLINICAL DATA:  Shortness of  breath EXAM: CT CHEST WITHOUT CONTRAST TECHNIQUE: Multidetector CT imaging of the chest was performed following the standard protocol without intravenous contrast. High resolution imaging of the lungs, as well as inspiratory and expiratory imaging, was performed. RADIATION DOSE REDUCTION: This exam was performed according to the departmental dose-optimization program which includes automated exposure control, adjustment of the mA and/or kV according to patient size and/or use of iterative reconstruction technique. COMPARISON:  Multiple priors, most recent chest CT dated June 25th 2023; chest CT dated November 07, 2017; chest CT dated January 27, 2017 FINDINGS: Cardiovascular: Mild cardiomegaly. No pericardial effusion. Moderate coronary artery calcifications of the LAD. No significant atherosclerotic disease of the thoracic aorta. Mediastinum/Nodes: Thyroid and esophagus are unremarkable. No pathologically enlarged lymph nodes seen in the chest. Lungs/Pleura: Central airways are patent. Expiratory phase images on today's exam are inadequate since obtained during inspiration, however on prior CTs with expiratory phase imaging there is no evidence of air trapping. Mild centrilobular and paraseptal emphysema. Mild upper and anterior lung predominant subpleural and peribronchovascular reticular opacities with no evidence of traction bronchiectasis or honeycomb change. Findings are new when compared with January 27, 2017 exam, but improved when compared with November 07, 2017 exam. Mild focal bronchiectasis in the lingula with associated linear opacities, unchanged when compared with most recent prior exam and likely sequela of prior infection or aspiration. No consolidation, pleural effusion or pneumothorax. Upper Abdomen: No acute abnormality. Musculoskeletal: No chest wall mass or suspicious bone lesions identified. IMPRESSION: 1. No acute airspace opacity. 2. Mild upper and anterior lung predominant subpleural and  peribronchovascular reticular opacities with no traction bronchiectasis or evidence of air trapping, findings are new when compared with January 27, 2017 exam, but improved when compared with November 07, 2017 exam. Findings are likely due to scarring related to prior acute lung injury. 3. Moderate coronary artery calcifications of the LAD. 4. Mild emphysema (ICD10-J43.9). Electronically Signed   By: Yetta Glassman M.D.   On: 10/14/2021 20:05   DG Chest 2 View  Result Date: 10/08/2021 CLINICAL DATA:  Shortness of breath, chest pain EXAM: CHEST - 2 VIEW COMPARISON:  08/26/2021 FINDINGS: The heart size and mediastinal contours are within normal limits. Both lungs are clear. The visualized skeletal structures are unremarkable. IMPRESSION: No active cardiopulmonary disease. Electronically Signed   By: Rolm Baptise M.D.   On: 10/08/2021 19:14    Microbiology: Recent Results (from the past 240 hour(s))  SARS Coronavirus 2 by RT PCR (hospital order, performed in [facility] hospital lab) *cepheid single result test* Anterior Nasal Swab     Status: None   Collection Time: 10/08/21 10:22 PM   Specimen: Anterior Nasal Swab  Result Value Ref Range Status   SARS Coronavirus 2 by RT PCR NEGATIVE NEGATIVE Final    Comment: (NOTE) SARS-CoV-2 target nucleic acids are NOT DETECTED.  The SARS-CoV-2 RNA is generally detectable in upper and lower respiratory specimens during the acute  phase of infection. The lowest concentration of SARS-CoV-2 viral copies this assay can detect is 250 copies / mL. A negative result does not preclude SARS-CoV-2 infection and should not be used as the sole basis for treatment or other patient management decisions.  A negative result may occur with improper specimen collection / handling, submission of specimen other than nasopharyngeal swab, presence of viral mutation(s) within the areas targeted by this assay, and inadequate number of viral copies (<250 copies / mL). A negative  result must be combined with clinical observations, patient history, and epidemiological information.  Fact Sheet for Patients:   https://www.patel.info/  Fact Sheet for Healthcare Providers: https://hall.com/  This test is not yet approved or  cleared by the [place] FDA and has been authorized for detection and/or diagnosis of SARS-CoV-2 by FDA under an Emergency Use Authorization (EUA).  This EUA will remain in effect (meaning this test can be used) for the duration of the COVID-19 declaration under Section 564(b)(1) of the Act, 21 U.S.C. section 360bbb-3(b)(1), unless the authorization is terminated or revoked sooner.  Performed at [facility], [place] [address] [place], [place] [address]   Respiratory (~20 pathogens) panel by PCR     Status: None   Collection Time: 10/09/21  3:02 AM   Specimen: Nasopharyngeal Swab; Respiratory  Result Value Ref Range Status   Adenovirus NOT DETECTED NOT DETECTED Final   Coronavirus 229E NOT DETECTED NOT DETECTED Final    Comment: (NOTE) The Coronavirus on the Respiratory Panel, DOES NOT test for the novel  Coronavirus (2019 nCoV)    Coronavirus HKU1 NOT DETECTED NOT DETECTED Final   Coronavirus NL63 NOT DETECTED NOT DETECTED Final   Coronavirus OC43 NOT DETECTED NOT DETECTED Final   Metapneumovirus NOT DETECTED NOT DETECTED Final   Rhinovirus / Enterovirus NOT DETECTED NOT DETECTED Final   Influenza A NOT DETECTED NOT DETECTED Final   Influenza B NOT DETECTED NOT DETECTED Final   Parainfluenza Virus 1 NOT DETECTED NOT DETECTED Final   Parainfluenza Virus 2 NOT DETECTED NOT DETECTED Final   Parainfluenza Virus 3 NOT DETECTED NOT DETECTED Final   Parainfluenza Virus 4 NOT DETECTED NOT DETECTED Final   Respiratory Syncytial Virus NOT DETECTED NOT DETECTED Final   Bordetella pertussis NOT DETECTED NOT DETECTED Final   Bordetella Parapertussis NOT DETECTED NOT DETECTED Final    Chlamydophila pneumoniae NOT DETECTED NOT DETECTED Final   Mycoplasma pneumoniae NOT DETECTED NOT DETECTED Final    Comment: Performed at [facility], [place]. [address] [place], [place] [address]  MRSA Next Gen by PCR, Nasal     Status: None   Collection Time: 10/09/21  3:02 AM   Specimen: Nasal Mucosa; Nasal Swab  Result Value Ref Range Status   MRSA by PCR Next Gen NOT DETECTED NOT DETECTED Final    Comment: (NOTE) The GeneXpert MRSA Assay (FDA approved for NASAL specimens only), is one component of a comprehensive MRSA colonization surveillance program. It is not intended to diagnose MRSA infection nor to guide or monitor treatment for MRSA infections. Test performance is not FDA approved in patients less than 61 years old. Performed at [facility], [place] [address], [place], [place] [address]      Labs: Basic Metabolic Panel: Recent Labs  Lab 10/13/21 0543 10/14/21 0535 10/15/21 0437 10/16/21 0506 10/17/21 0614 10/18/21 0531  NA 137 138 136 136 137  --   K 3.9 3.9 4.0 3.7 4.2  --   CL 101  99 98 98 100  --   CO2 30 32 _0 --   GLUCOSE 162* 170* 290* 223* 227*  --   BUN 22* 24* 30* 25* 26*  --   CREATININE 0.63 0.87 0.98 0.78 0.73  --   CALCIUM 7.8* 8.1* 7.7* 8.2* 8.3*  --   MG 2.2 2.5* 2.1 2.2 2.3 2.4  PHOS 3.4 3.3 3.3 3.4 3.9 4.0   Liver Function Tests: Recent Labs  Lab 10/12/21 0510 10/13/21 0543 10/14/21 0535  AST _1 ALT 24 23 32  ALKPHOS 64 59 65  BILITOT 0.3 0.4 0.3  PROT 6.7 6.1* 6.4*  ALBUMIN 3.2* 2.9* 3.1*   No results for input(s): "LIPASE", "AMYLASE" in the last 168 hours. No results for input(s): "AMMONIA" in the last 168 hours. CBC: Recent Labs  Lab 10/14/21 0535 10/15/21 0437 10/16/21 0506 10/17/21 0614 10/18/21 0531  WBC 22.0* 19.9* 24.6* 23.0* 23.2*  NEUTROABS 17.3* 16.8* 20.6* 19.2* 19.1*  HGB 11.3* 10.5* 10.7* 10.6* 10.4*  HCT 37.4 35.2* 35.6* 35.1* 34.7*  MCV 82.0 83.0 82.4 81.8 81.6  PLT  469* 415* 416* 410* 404*   Cardiac Enzymes: No results for input(s): "CKTOTAL", "CKMB", "CKMBINDEX", "TROPONINI" in the last 168 hours. BNP: BNP (last 3 results) Recent Labs    08/29/21 1514 10/08/21 1802 10/13/21 1253  BNP 21.8 36.1 66.3    ProBNP (last 3 results) No results for input(s): "PROBNP" in the last 8760 hours.  CBG: Recent Labs  Lab 10/17/21 0723 10/17/21 1123 10/17/21 1735 10/17/21 2317 10/18/21 0753  GLUCAP 194* 405* 199* 255* 248*    FURTHER DISCHARGE INSTRUCTIONS:   Get Medicines reviewed and adjusted: Please take all your medications with you for your next visit with your Primary MD   Laboratory/radiological data: Please request your Primary MD to go over all hospital tests and procedure/radiological results at the follow up, please ask your Primary MD to get all Hospital records sent to his/her office.   In some cases, they will be blood work, cultures and biopsy results pending at the time of your discharge. Please request that your primary care M.D. goes through all the records of your hospital data and follows up on these results.   Also Note the following: If you experience worsening of your admission symptoms, develop shortness of breath, life threatening emergency, suicidal or homicidal thoughts you must seek medical attention immediately by calling 911 or calling your MD immediately  if symptoms less severe.   You must read complete instructions/literature along with all the possible adverse reactions/side effects for all the Medicines you take and that have been prescribed to you. Take any new Medicines after you have completely understood and accpet all the possible adverse reactions/side effects.    Do not drive when taking Pain medications or sleeping medications (Benzodaizepines)   Do not take more than prescribed Pain, Sleep and Anxiety Medications. It is not advisable to combine anxiety,sleep and pain medications without talking with your  primary care practitioner   Special Instructions: If you have smoked or chewed Tobacco  in the last 2 yrs please stop smoking, stop any regular Alcohol  and or any Recreational drug use.   Wear Seat belts while driving.   Please note: You were cared for by a hospitalist during your hospital stay. Once you are discharged, your primary care physician will handle any further medical issues. Please note that NO REFILLS for any discharge medications will be authorized once you are  discharged, as it is imperative that you return to your primary care physician (or establish a relationship with a primary care physician if you do not have one) for your post hospital discharge needs so that they can reassess your need for medications and monitor your lab values.     Signed:  Florencia Reasons MD, PhD, FACP  [facility] 10/18/2021, 4:05 PM

## 2021-10-18 NOTE — Progress Notes (Signed)
Nsg Discharge Note  Admit Date:  10/08/2021 Discharge date: 10/18/2021   April [name] to be D/C'd Home per MD order.  AVS completed.  Copy for chart, and copy for patient signed, and dated. Patient/caregiver able to verbalize understanding.  Discharge Medication: Allergies as of 10/18/2021       Reactions   Contrast Media [iodinated Contrast Media] Itching   Ct contrast        Medication List     STOP taking these medications    carvedilol 6.25 MG tablet Commonly known as: COREG       TAKE these medications    albuterol 108 (90 Base) MCG/ACT inhaler Commonly known as: ProAir HFA Inhale 2 puffs into the lungs every 6 (six) hours as needed for wheezing or shortness of breath.   amLODipine 10 MG tablet Commonly known as: NORVASC TAKE 1 TABLET(10 MG) BY MOUTH DAILY What changed: See the new instructions.   apixaban 5 MG Tabs tablet Commonly known as: ELIQUIS Take 1 tablet (5 mg total) by mouth 2 (two) times daily.   arformoterol 15 MCG/2ML Nebu Commonly known as: BROVANA Take 2 mLs (15 mcg total) by nebulization 2 (two) times daily.   atorvastatin 20 MG tablet Commonly known as: LIPITOR Take 20 mg by mouth every morning.   baclofen 20 MG tablet Commonly known as: LIORESAL Take 20 mg by mouth daily as needed for muscle spasms (pain).   clonazePAM 0.5 MG tablet Commonly known as: KlonoPIN Take 1 tablet (0.5 mg total) by mouth daily as needed for anxiety.   cloNIDine 0.2 MG tablet Commonly known as: CATAPRES Take 0.2 mg by mouth 2 (two) times daily.   Diclofenac Sodium 3 % Gel Apply 1 application. topically daily as needed (pain).   diltiazem 120 MG 24 hr capsule Commonly known as: CARDIZEM CD Take 1 capsule (120 mg total) by mouth daily. Start taking on: October 19, 2021   DULoxetine 60 MG capsule Commonly known as: CYMBALTA Take 1 capsule (60 mg total) by mouth daily. What changed:  when to take this reasons to take this   EPINEPHrine 0.3 mg/0.3 mL  Soaj injection Commonly known as: EPI-PEN Inject 0.3 mg into the muscle as needed for anaphylaxis.   fluticasone 50 MCG/ACT nasal spray Commonly known as: FLONASE SHAKE LIQUID AND USE 1 SPRAY IN EACH NOSTRIL DAILY AS NEEDED FOR ALLERGIES OR RHINITIS What changed: See the new instructions.   furosemide 40 MG tablet Commonly known as: LASIX Take 1 tablet (40 mg total) by mouth daily.   ipratropium-albuterol 0.5-2.5 (3) MG/3ML Soln Commonly known as: DUONEB Take 3 mLs by nebulization every 6 (six) hours as needed (shortness of breath/wheezing). What changed: Another medication with the same name was removed. Continue taking this medication, and follow the directions you see here.   levothyroxine 50 MCG tablet Commonly known as: SYNTHROID Take 50 mcg by mouth daily.   montelukast 10 MG tablet Commonly known as: SINGULAIR Take 1 tablet (10 mg total) by mouth at bedtime.   naloxone 4 MG/0.1ML Liqd nasal spray kit Commonly known as: NARCAN Place 1 spray into the nose once as needed (opioid overdose).   omeprazole 40 MG capsule Commonly known as: PRILOSEC Take 40 mg by mouth daily.   oxyCODONE-acetaminophen 10-325 MG tablet Commonly known as: PERCOCET Take 1 tablet by mouth 5 (five) times daily as needed (nerve pain).   Ozempic (1 MG/DOSE) 4 MG/3ML Sopn Generic drug: Semaglutide (1 MG/DOSE) Inject 1 mg into the skin every Monday.  phentermine 15 MG capsule Take 15 mg by mouth daily.   potassium chloride SA 20 MEQ tablet Commonly known as: KLOR-CON M TAKE 1 TABLET( 20 MEQ TOTAL) BY MOUTH DAILY What changed: See the new instructions.   predniSONE 10 MG tablet Commonly known as: DELTASONE Prednisone dosing: Take  Prednisone $RemoveBef'40mg'TnVYZSCCxA$  (4 tabs) x 3 days, then taper to $Remove'30mg'bqzJKVU$  (3 tabs) x 3 days, then $RemoveBe'20mg'xABguyRRo$  (2 tabs) x 3days, then $RemoveBef'10mg'hsUWHzQalU$  (1 tab) x 3days, then OFF.   pregabalin 300 MG capsule Commonly known as: LYRICA Take 300 mg by mouth 2 (two) times daily.   revefenacin 175 MCG/3ML  nebulizer solution Commonly known as: YUPELRI Take 3 mLs (175 mcg total) by nebulization daily. Start taking on: October 19, 2021   Tezspire 210 MG/1.91ML syringe Generic drug: tezepelumab-ekko Inject 210 mg into the skin every 28 (twenty-eight) days. What changed: Another medication with the same name was added. Make sure you understand how and when to take each.   Tezspire 210 MG/1.91ML Soaj Generic drug: Tezepelumab-ekko Inject 210 mg into the skin every 28 (twenty-eight) days. Courier to pulm: [address] [place] [address],  [place] [address]. Appointment on 11/05/21. What changed: You were already taking a medication with the same name, and this prescription was added. Make sure you understand how and when to take each.   topiramate 200 MG tablet Commonly known as: TOPAMAX Take 200 mg by mouth at bedtime.   Trelegy Ellipta 100-62.5-25 MCG/ACT Aepb Generic drug: Fluticasone-Umeclidin-Vilant Inhale 1 puff into the lungs daily.   zolpidem 10 MG tablet Commonly known as: AMBIEN TAKE 1 TABLET BY MOUTH AT BEDTIME AS NEEDED FOR SLEEP What changed:  how much to take how to take this when to take this additional instructions               Durable Medical Equipment  (From admission, onward)           Start     Ordered   10/15/21 1812  For home use only DME Nebulizer machine  Once       Question Answer Comment  Patient needs a nebulizer to treat with the following condition Asthma   Length of Need Lifetime      10/15/21 1811   10/15/21 1812  For home use only DME 4 wheeled rolling walker with seat  Once       Comments: Hypoxic respiratory failure.  Question:  Patient needs a walker to treat with the following condition  Answer:  Physical deconditioning   10/15/21 1812            Discharge Assessment: Vitals:   10/18/21 0851 10/18/21 1032  BP:    Pulse:    Resp: 18   Temp:    SpO2: 99% 98%   Skin clean, dry and intact without evidence of skin break down,  no evidence of skin tears noted. IV catheter discontinued intact. Site without signs and symptoms of complications - no redness or edema noted at insertion site, patient denies c/o pain - only slight tenderness at site.  Dressing with slight pressure applied.  D/c Instructions-Education: Discharge instructions given to patient/family with verbalized understanding. D/c education completed with patient/family including follow up instructions, medication list, d/c activities limitations if indicated, with other d/c instructions as indicated by MD - patient able to verbalize understanding, all questions fully answered. Patient instructed to return to ED, call 911, or call MD for any changes in condition.  Patient escorted via [place], and D/C home  via private auto.  Mysha Peeler, Jolene Schimke, RN 10/18/2021 11:02 AM

## 2021-10-18 NOTE — Progress Notes (Signed)
   No new recs. Please see prior note for details. Will sign off. Please let us know if we can be of further assistance  Candee Furbish, MD

## 2021-10-18 NOTE — Discharge Instructions (Signed)
Heart Failure Nutrition Therapy  This nutrition therapy will help you feel better and support your heart.  This plan focuses on: Limiting sodium in your diet. Salt (sodium) makes your body hold water. When your body holds too much water, you can feel shortness of breath and swelling. You can prevent these symptoms by eating less salt. Limiting fluid in your diet. For some patients, drinking too much fluid can make heart failure worse. It can cause symptoms such as shortness of breath and swelling. Limiting fluids can help relieve some of your symptoms. Managing your weight. Your registered dietitian nutritionist (RDN) can help you choose a healthy weight for your body type. You can achieve these goals by: Reading food labels to keep track of how much sodium is in the foods you eat. Limiting foods that are high in sodium. Checking your weight to make sure you're not retaining too much fluid.  Reading the Food Label: How Much Sodium Is Too Much? The nutrition plan for heart failure usually limits the sodium you get from food and drinks to 2,000 milligrams per day. Salt is the main source of sodium. Read the nutrition label to find out how much sodium is in 1 serving of a food. Select foods with 140 milligrams of sodium or less per serving. Foods with more than 300 milligrams of sodium per serving may not fit into a reduced-sodium meal plan. Check serving sizes. If you eat more than 1 serving, you will get more sodium than the amount listed.  Cutting Back on Sodium Avoid processed foods. Eat more fresh foods. Fresh and frozen fruits and vegetables without added juices or sauces are naturally low in sodium. Fresh meats are lower in sodium than processed meats, such as bacon, sausage, and hot dogs. Read the nutrition label or ask your butcher to help you find a fresh meat that is low in sodium. Eat less salt, at the table and when cooking. Just 1 teaspoon of table salt has 2,300 milligrams of  sodium. Leave the salt out of recipes for pasta, casseroles, and soups. Ask your RDN how to cook your favorite recipes without sodium. Be a smart shopper. Look for food packages that say "salt-free" or "sodium-free." These items contain less than 5 milligrams of sodium per serving. "Very-low-sodium" products contain less than 35 milligrams of sodium per serving. "Low-sodium" products contain less than 140 milligrams of sodium per serving. "Unsalted" or "no added salt" products may still be high in sodium. Check the nutrition label. Add flavors to your food without adding sodium. Try lemon juice, lime juice, fruit juice, or vinegar. Dry or fresh herbs add flavor. Try basil, bay leaf, dill, rosemary, parsley, sage, dry mustard, nutmeg, thyme, and paprika. Pepper, red pepper flakes, and cayenne pepper can add spice to your meals without adding sodium. Hot sauce contains sodium, but if you use just a drop or two, it will not add up to much. Buy a sodium-free seasoning blend or make your own at home. Use caution when you eat outside your home. Restaurant foods can be very high in sodium. Ask for nutrition information. Many restaurants provide nutrition facts on their menus or websites. Let your server know that you want your food to be cooked without salt. Ask for your salad dressing and sauces to come "on the side." Fluid Restriction Your doctor may ask you to follow a fluid restriction in addition to taking diuretics (water pills). Ask your doctor how much fluid you can have. Foods that are liquid   at room temperature are considered a fluid, such as popsicles, soup, ice cream, and Jell-O. Here are some common conversions that will help you measure your fluid intake every day: 1,000 milliliters = 1 liter or 4 cups 1 fluid ounce = 30 milliliters  1 cup = 240 milliliters 2,000 milliliters = 2 liters or 8 cups  1,500 milliliters = 1 liters or 6 cups    Weight Monitoring Weigh yourself each day.  Sudden weight gain is a sign that fluid is building up in your body. Follow these guidelines: Weigh yourself every morning. If you gain 3 or more pounds in 1-2 days or 5 or more pounds within 1 week, call your doctor. Your doctor may adjust your medicine to get rid of the extra fluid. Talk with your doctor or RDN about what a healthy weight is for you. Talk with your doctor to find out what type of physical activity is best for you.  Foods Recommended Food Group Recommended Foods  Grains Bread with less than 80 milligrams sodium per slice (yeast breads usually have less sodium than those made with baking soda) Homemade bread made with reduced-sodium baking soda Many cold cereals, especially shredded wheat and puffed rice Oats, grits, or cream of wheat Dry pastas, noodles, quinoa, and rice  Vegetables Fresh and frozen vegetables without added sauces, salt, or sodium Homemade soups (salt free or low sodium) Low-sodium or sodium-free canned vegetables and soups  Fruits Fresh and canned fruits Dried fruits, such as raisins, cranberries, and prunes  Dairy (Milk and Milk Products) Milk or milk powder Rice milk and soy milk Yogurt, including Greek yogurt Small amounts of natural, block cheese or reduced-sodium cheese (Swiss, ricotta, and fresh mozzarella are lower in sodium than others) Regular or soft cream cheese and low-sodium cottage cheese  Protein Foods (Meat, Poultry, Fish, Beans) Fresh meats and fish Turkey bacon (except if packaged in a sodium solution) Canned or packed tuna (no more than 4 ounces at 1 serving) Dried beans and peas; edamame (fresh soybeans) Eggs or egg beaters (if  less than 200 mg per serving) Unsalted nuts or peanut butter  Desserts and Snacks Fresh fruit or applesauce Angel food cake Granola bars Unsalted pretzels, popcorn, or nuts Pudding or gelatin with whipped cream topping Homemade rice-crispy treats Vanilla wafers Frozen fruit bars  Fats Tub or liquid  margarine Unsaturated fat oils (canola, olive, corn, sunflower, safflower, peanut)  Condiments Fresh or dried herbs; low-sodium ketchup; vinegar; lemon or lime juice; pepper; salt-free seasoning mixes and marinades (salt-free seasoning blend); simple salad dressings (vinegar and oil); salt-free sauces   Foods Not Recommended Food Group Foods Not Recommended  Grains Breads or crackers topped with salt Cereals (hot/cold) with more than 300 milligrams sodium per serving Biscuits, cornbread, and other "quick" breads prepared with baking soda Prepackaged bread crumbs Self-rising flours  Vegetables Canned vegetables (unless they are salt free or low sodium) Frozen vegetables with seasoning and sauces Sauerkraut and pickled vegetables Canned or dried soups (unless they are salt free or low  sodium) French fries and onion rings  Fruits Dried fruits preserved with sodium-containing additives  Dairy (Milk and Milk Products) Buttermilk Processed cheeses  Cottage cheese (unless a low-sodium variety) Feta cheese; shredded cheese (has more sodium than block cheese); "singles" slices and string cheese  Protein Foods (Meat, Poultry, Fish, Beans) Cured meats: bacon, ham, sausage, pepperoni, and hot dogs Canned meats: chili, Vienna sausage, sardines, and ham Smoked fish and meats Frozen meals that have more than 600   milligrams sodium  Fats Salted butter or margarine  Condiments Salt, sea salt, kosher salt, onion salt, and garlic salt Seasoning mixes containing salt (Lemon Pepper or Bouillon cubes) Catsup or ketchup, BBQ sauce, Worcestershire and soy sauce Salsa, pickles, olives, relish Salad dressings: ranch, blue cheese, Italian, and French   Alcohol Check with your doctor.   Heart Failure Sample 1-Day Menu View Nutrient Info Breakfast 1 cup regular oatmeal made with water or milk 1 cup reduced-fat (2%) milk 1 medium banana 1 slice whole wheat bread 1 tablespoon salt-free peanut butter  Morning  Snack 1/2 cup dried cranberries  Lunch 3 ounces grilled chicken breast 1 cup salad greens Olive oil and vinegar dressing (for greens) 5 unsalted or low-sodium crackers Fruit plate with 1/4 cup strawberries 1/2 sliced orange (for fruit plate) 1 peach half (for fruit plate)  Afternoon Snack 1 ounce low-sodium turkey 1 piece whole wheat bread  Evening Meal 3 ounces herb-baked fish 1 baked potato 2 teaspoons soft margarine (trans fat-free) (for potato) Sliced tomatoes 1/2 cup steamed spinach drizzled with lemon juice 3-inch square of angel food cake Fresh strawberries (2) (for cake)  Evening Snack 2 tablespoons salt-free peanut butter 5 low-sodium crackers  Daily Sum Nutrient Unit Value  Macronutrients  Energy kcal 1890  Energy kJ 7906  Protein g 95  Total lipid (fat) g 56  Carbohydrate, by difference g 270  Fiber, total dietary g 31  Sugars, total g 99  Minerals  Calcium, Ca mg 949  Iron, Fe mg 27  Sodium, Na mg 1538  Vitamins  Vitamin C, total ascorbic acid mg 118  Vitamin A, IU IU 18639  Vitamin D IU 232  Lipids  Fatty acids, total saturated g 13  Fatty acids, total monounsaturated g 22  Fatty acids, total polyunsaturated g 16  Cholesterol mg 126     Heart Failure Vegan Sample 1-Day Menu View Nutrient Info Breakfast 1 cup oatmeal  cup walnuts 1 banana 1 cup soymilk fortified with calcium, vitamin B12, and vitamin D  Lunch 1 large whole wheat pita Salad made with: 1 cup chickpeas 1 cup lettuce  cup cherry tomatoes 1 cup strawberries 1 tablespoon olive oil 1 tablespoon balsamic vinegar  Evening Meal  cup tofu 2 teaspoons olive oil Pinch garlic powder 1 baked potato 1 tablespoon margarine, soft, tub  cup cooked spinach with: Squeeze of lemon 1 cup soymilk fortified with calcium, vitamin B12, and vitamin D  Evening Snack 1 tablespoon peanut butter, without salt  ounce pretzels, without salt  Daily Sum Nutrient Unit Value  Macronutrients  Energy  kcal 1848  Energy kJ 7735  Protein g 74  Total lipid (fat) g 83  Carbohydrate, by difference g 223  Fiber, total dietary g 39  Sugars, total g 44  Minerals  Calcium, Ca mg 1325  Iron, Fe mg 20  Sodium, Na mg 1098  Vitamins  Vitamin C, total ascorbic acid mg 140  Vitamin A, IU IU 15807  Vitamin D IU 238  Lipids  Fatty acids, total saturated g 13  Fatty acids, total monounsaturated g 33  Fatty acids, total polyunsaturated g 32  Cholesterol mg 0     Heart Failure Vegetarian (Lacto-Ovo) Sample 1-Day Menu View Nutrient Info Breakfast 1 cup oatmeal  cup walnuts 1 banana 1 cup fat-free milk  Lunch 1 large whole wheat pita Salad made with:  cup chickpeas 1 ounce mozzarella cheese 1 cup lettuce  cup cherry tomatoes 1 cup strawberries 1 tablespoon olive oil 1   tablespoon balsamic vinegar  Evening Meal  cup tofu 2 teaspoons olive oil Pinch garlic powder 1 baked potato 1 tablespoon margarine, soft, tub  cup cooked spinach with: Squeeze of lemon 1 cup fat-free milk  Evening Snack 1 tablespoon peanut butter, without salt 1 apple  Daily Sum Nutrient Unit Value  Macronutrients  Energy kcal 1847  Energy kJ 7734  Protein g 76  Total lipid (fat) g 79  Carbohydrate, by difference g 231  Fiber, total dietary g 35  Sugars, total g 83  Minerals  Calcium, Ca mg 1488  Iron, Fe mg 16  Sodium, Na mg 1100  Vitamins  Vitamin C, total ascorbic acid mg 149  Vitamin A, IU IU 16116  Vitamin D IU 234  Lipids  Fatty acids, total saturated g 15  Fatty acids, total monounsaturated g 32  Fatty acids, total polyunsaturated g 26  Cholesterol mg 28    Copyright 2020  [employer]. All rights reserved  

## 2021-10-18 NOTE — Care Management Important Message (Signed)
Important Message  Patient Details IM Letter given to the Patient. Name: April [name] MRN: [id number] Date of Birth: Oct 02, 1970   Medicare Important Message Given:  Yes     Kerin Salen 10/18/2021, 10:40 AM

## 2021-10-18 NOTE — Plan of Care (Signed)
  Problem: Health Behavior/Discharge Planning: Goal: Ability to manage health-related needs will improve Outcome: Adequate for Discharge   

## 2021-10-21 LAB — URINE DRUGS OF ABUSE SCREEN W ALC, ROUTINE (REF LAB)
Amphetamines, Urine: NEGATIVE ng/mL
Barbiturate, Ur: NEGATIVE ng/mL
Benzodiazepine Quant, Ur: NEGATIVE ng/mL
Cannabinoid Quant, Ur: NEGATIVE ng/mL
Cocaine (Metab.): NEGATIVE ng/mL
Ethanol U, Quan: NEGATIVE %
Methadone Screen, Urine: NEGATIVE ng/mL
Phencyclidine, Ur: NEGATIVE ng/mL
Propoxyphene, Urine: NEGATIVE ng/mL

## 2021-10-21 LAB — OPIATES CONFIRMATION, URINE: OPIATES: NEGATIVE

## 2021-10-22 ENCOUNTER — Telehealth: Payer: Self-pay

## 2021-10-22 DIAGNOSIS — R5381 Other malaise: Secondary | ICD-10-CM

## 2021-10-22 DIAGNOSIS — F419 Anxiety disorder, unspecified: Secondary | ICD-10-CM

## 2021-10-22 NOTE — Telephone Encounter (Signed)
Referrals have been placed. With regards to insomnia, she is already on Ambien from Dr Maple Hudson and she will need to contact him regarding this/ Thanks

## 2021-10-22 NOTE — Telephone Encounter (Signed)
Transition Care Management Follow-up Telephone Call Date of discharge and from where: 10/18/2021, [facility] How have you been since you were released from the hospital? She said she is feeling  all right but her body hurts and sometimes her chest hurts. She explained that she is not able to sleep and she tosses and turns all night.  As far as her chest hurting, she said she knows when to go to the hospital and she does not feel she needs to go now.  She is trying to get the " fluid off" of her body. She would like a referral for psychiatry for her anxiety. She denied any SI.  I explained that [facility] is an option for behavioral health urgent care.  She is also interested in a referral to PT. Any questions or concerns? Yes- noted above.   Items Reviewed: Did the pt receive and understand the discharge instructions provided? Yes  Medications obtained and verified? Yes - she said she has all of her medications and a nebulizer and she did not have any questions about the med regime  Other? No  Any new allergies since your discharge? No  Dietary orders reviewed? Yes Do you have support at home?  She lives alone and her sister comes to check on her.   Home Care and Equipment/Supplies: Were home health services ordered? no If so, what is the name of the agency? N/a  Has the agency set up a time to come to the patient's home? not applicable Were any new equipment or medical supplies ordered?  Yes: nebulizer What is the name of the medical supply agency? [employer].  Were you able to get the supplies/equipment? Yes, she received it at the hospital.  Do you have any questions related to the use of the equipment or supplies? No  She has home O2 that she states she uses as needed. She said the doctors want her to use it continuously at night; but she doesn't   Functional Questionnaire: (I = Independent and D = Dependent) ADLs: independent.    Follow up appointments reviewed:  PCP Hospital  f/u appt confirmed? Yes  Scheduled to see Dr Alvis Lemmings-  on 11/08/2021. I also explained to her that [facility] to [facility] her without an appointment Monday- Thursday. She can call this clinic to check the location of the [facility].  Specialist Hospital f/u appt confirmed? Yes  Scheduled to see pulmonary - 11/13/2021.  Are transportation arrangements needed? No  If their condition worsens, is the pt aware to call PCP or go to the Emergency Dept.? Yes Was the patient provided with contact information for the PCP's office or ED? Yes Was to pt encouraged to call back with questions or concerns? Yes

## 2021-10-23 ENCOUNTER — Encounter (HOSPITAL_COMMUNITY): Payer: Self-pay

## 2021-10-23 ENCOUNTER — Encounter: Payer: Self-pay | Admitting: Internal Medicine

## 2021-10-23 ENCOUNTER — Emergency Department (HOSPITAL_COMMUNITY): Payer: Medicare Other

## 2021-10-23 ENCOUNTER — Inpatient Hospital Stay (HOSPITAL_COMMUNITY)
Admission: EM | Admit: 2021-10-23 | Discharge: 2021-10-26 | DRG: 202 | Disposition: A | Discharge status: Home Health | Payer: Medicare Other | Attending: Internal Medicine | Admitting: Internal Medicine

## 2021-10-23 ENCOUNTER — Other Ambulatory Visit: Payer: Self-pay

## 2021-10-23 DIAGNOSIS — Z79899 Other long term (current) drug therapy: Secondary | ICD-10-CM

## 2021-10-23 DIAGNOSIS — J9621 Acute and chronic respiratory failure with hypoxia: Secondary | ICD-10-CM | POA: Diagnosis present

## 2021-10-23 DIAGNOSIS — Z1152 Encounter for screening for COVID-19: Secondary | ICD-10-CM

## 2021-10-23 DIAGNOSIS — Z5986 Financial insecurity: Secondary | ICD-10-CM

## 2021-10-23 DIAGNOSIS — J45901 Unspecified asthma with (acute) exacerbation: Secondary | ICD-10-CM | POA: Diagnosis present

## 2021-10-23 DIAGNOSIS — I10 Essential (primary) hypertension: Secondary | ICD-10-CM | POA: Diagnosis present

## 2021-10-23 DIAGNOSIS — T380X6A Underdosing of glucocorticoids and synthetic analogues, initial encounter: Secondary | ICD-10-CM | POA: Diagnosis present

## 2021-10-23 DIAGNOSIS — Z87891 Personal history of nicotine dependence: Secondary | ICD-10-CM

## 2021-10-23 DIAGNOSIS — M069 Rheumatoid arthritis, unspecified: Secondary | ICD-10-CM | POA: Diagnosis present

## 2021-10-23 DIAGNOSIS — I5032 Chronic diastolic (congestive) heart failure: Secondary | ICD-10-CM | POA: Diagnosis present

## 2021-10-23 DIAGNOSIS — G4733 Obstructive sleep apnea (adult) (pediatric): Secondary | ICD-10-CM | POA: Diagnosis present

## 2021-10-23 DIAGNOSIS — Z7901 Long term (current) use of anticoagulants: Secondary | ICD-10-CM

## 2021-10-23 DIAGNOSIS — T502X5A Adverse effect of carbonic-anhydrase inhibitors, benzothiadiazides and other diuretics, initial encounter: Secondary | ICD-10-CM | POA: Diagnosis present

## 2021-10-23 DIAGNOSIS — I110 Hypertensive heart disease with heart failure: Secondary | ICD-10-CM | POA: Diagnosis present

## 2021-10-23 DIAGNOSIS — K219 Gastro-esophageal reflux disease without esophagitis: Secondary | ICD-10-CM | POA: Diagnosis present

## 2021-10-23 DIAGNOSIS — Z91120 Patient's intentional underdosing of medication regimen due to financial hardship: Secondary | ICD-10-CM

## 2021-10-23 DIAGNOSIS — R0602 Shortness of breath: Secondary | ICD-10-CM | POA: Diagnosis not present

## 2021-10-23 DIAGNOSIS — J9600 Acute respiratory failure, unspecified whether with hypoxia or hypercapnia: Secondary | ICD-10-CM | POA: Diagnosis present

## 2021-10-23 DIAGNOSIS — Z77098 Contact with and (suspected) exposure to other hazardous, chiefly nonmedicinal, chemicals: Secondary | ICD-10-CM | POA: Diagnosis present

## 2021-10-23 DIAGNOSIS — F32A Depression, unspecified: Secondary | ICD-10-CM | POA: Diagnosis present

## 2021-10-23 DIAGNOSIS — J4551 Severe persistent asthma with (acute) exacerbation: Principal | ICD-10-CM | POA: Diagnosis present

## 2021-10-23 DIAGNOSIS — Z825 Family history of asthma and other chronic lower respiratory diseases: Secondary | ICD-10-CM

## 2021-10-23 DIAGNOSIS — Z765 Malingerer [conscious simulation]: Secondary | ICD-10-CM

## 2021-10-23 DIAGNOSIS — Z7951 Long term (current) use of inhaled steroids: Secondary | ICD-10-CM

## 2021-10-23 DIAGNOSIS — N179 Acute kidney failure, unspecified: Secondary | ICD-10-CM | POA: Diagnosis present

## 2021-10-23 DIAGNOSIS — Z91041 Radiographic dye allergy status: Secondary | ICD-10-CM

## 2021-10-23 DIAGNOSIS — F419 Anxiety disorder, unspecified: Secondary | ICD-10-CM | POA: Diagnosis present

## 2021-10-23 DIAGNOSIS — Z79890 Hormone replacement therapy (postmenopausal): Secondary | ICD-10-CM

## 2021-10-23 DIAGNOSIS — R079 Chest pain, unspecified: Secondary | ICD-10-CM

## 2021-10-23 DIAGNOSIS — N3000 Acute cystitis without hematuria: Secondary | ICD-10-CM

## 2021-10-23 DIAGNOSIS — E039 Hypothyroidism, unspecified: Secondary | ICD-10-CM | POA: Diagnosis present

## 2021-10-23 DIAGNOSIS — E785 Hyperlipidemia, unspecified: Secondary | ICD-10-CM | POA: Diagnosis present

## 2021-10-23 DIAGNOSIS — Z8249 Family history of ischemic heart disease and other diseases of the circulatory system: Secondary | ICD-10-CM

## 2021-10-23 DIAGNOSIS — I480 Paroxysmal atrial fibrillation: Secondary | ICD-10-CM | POA: Diagnosis present

## 2021-10-23 DIAGNOSIS — R300 Dysuria: Secondary | ICD-10-CM | POA: Diagnosis present

## 2021-10-23 DIAGNOSIS — J441 Chronic obstructive pulmonary disease with (acute) exacerbation: Secondary | ICD-10-CM | POA: Diagnosis present

## 2021-10-23 LAB — TROPONIN I (HIGH SENSITIVITY)
Troponin I (High Sensitivity): 8 ng/L (ref <18)
Troponin I (High Sensitivity): 8 ng/L (ref <18)

## 2021-10-23 LAB — I-STAT CHEM 8, ED
BUN: 19 mg/dL (ref 6–20)
Calcium, Ion: 1.22 mmol/L (ref 1.15–1.40)
Chloride: 109 mmol/L (ref 98–111)
Creatinine, Ser: 1.1 mg/dL — ABNORMAL HIGH (ref 0.44–1.00)
Glucose, Bld: 118 mg/dL — ABNORMAL HIGH (ref 70–99)
HCT: 36 % (ref 36.0–46.0)
Hemoglobin: 12.2 g/dL (ref 12.0–15.0)
Potassium: 3.9 mmol/L (ref 3.5–5.1)
Sodium: 140 mmol/L (ref 135–145)
TCO2: 24 mmol/L (ref 22–32)

## 2021-10-23 LAB — URINALYSIS, ROUTINE W REFLEX MICROSCOPIC
Bilirubin Urine: NEGATIVE
Glucose, UA: NEGATIVE mg/dL
Hgb urine dipstick: NEGATIVE
Ketones, ur: NEGATIVE mg/dL
Nitrite: NEGATIVE
Protein, ur: NEGATIVE mg/dL
Specific Gravity, Urine: 1.027 (ref 1.005–1.030)
pH: 5 (ref 5.0–8.0)

## 2021-10-23 LAB — COMPREHENSIVE METABOLIC PANEL
ALT: 18 U/L (ref 0–44)
AST: 15 U/L (ref 15–41)
Albumin: 3.3 g/dL — ABNORMAL LOW (ref 3.5–5.0)
Alkaline Phosphatase: 71 U/L (ref 38–126)
Anion gap: 8 (ref 5–15)
BUN: 20 mg/dL (ref 6–20)
CO2: 21 mmol/L — ABNORMAL LOW (ref 22–32)
Calcium: 9 mg/dL (ref 8.9–10.3)
Chloride: 111 mmol/L (ref 98–111)
Creatinine, Ser: 1.2 mg/dL — ABNORMAL HIGH (ref 0.44–1.00)
GFR, Estimated: 56 mL/min — ABNORMAL LOW (ref >60)
Glucose, Bld: 116 mg/dL — ABNORMAL HIGH (ref 70–99)
Potassium: 4 mmol/L (ref 3.5–5.1)
Sodium: 140 mmol/L (ref 135–145)
Total Bilirubin: 0.3 mg/dL (ref 0.3–1.2)
Total Protein: 6.9 g/dL (ref 6.5–8.1)

## 2021-10-23 LAB — CBC
HCT: 38 % (ref 36.0–46.0)
Hemoglobin: 11.5 g/dL — ABNORMAL LOW (ref 12.0–15.0)
MCH: 24.7 pg — ABNORMAL LOW (ref 26.0–34.0)
MCHC: 30.3 g/dL (ref 30.0–36.0)
MCV: 81.5 fL (ref 80.0–100.0)
Platelets: 351 10*3/uL (ref 150–400)
RBC: 4.66 MIL/uL (ref 3.87–5.11)
RDW: 20 % — ABNORMAL HIGH (ref 11.5–15.5)
WBC: 19.1 10*3/uL — ABNORMAL HIGH (ref 4.0–10.5)
nRBC: 0 % (ref 0.0–0.2)

## 2021-10-23 LAB — I-STAT BETA HCG BLOOD, ED (MC, WL, AP ONLY): I-stat hCG, quantitative: <5 m[IU]/mL (ref <5)

## 2021-10-23 LAB — CK: Total CK: 88 U/L (ref 38–234)

## 2021-10-23 LAB — MAGNESIUM: Magnesium: 2.2 mg/dL (ref 1.7–2.4)

## 2021-10-23 LAB — BRAIN NATRIURETIC PEPTIDE: B Natriuretic Peptide: 18.9 pg/mL (ref 0.0–100.0)

## 2021-10-23 LAB — RESP PANEL BY RT-PCR (FLU A&B, COVID) ARPGX2
Influenza A by PCR: NEGATIVE
Influenza B by PCR: NEGATIVE
SARS Coronavirus 2 by RT PCR: NEGATIVE

## 2021-10-23 LAB — TSH: TSH: 2.686 u[IU]/mL (ref 0.350–4.500)

## 2021-10-23 MED ORDER — DULOXETINE HCL 60 MG PO CPEP: 60.0000 mg | ORAL_CAPSULE | Freq: Every day | ORAL | Status: DC | PRN

## 2021-10-23 MED ORDER — CLONAZEPAM 0.5 MG PO TABS
0.5000 mg | ORAL_TABLET | Freq: Every day | ORAL | Status: DC | PRN
  Administered 2021-10-23: 0.5 mg via ORAL
  Filled 2021-10-23: qty 1

## 2021-10-23 MED ORDER — SODIUM CHLORIDE 0.9 % IV SOLN
1.0000 g | Freq: Once | INTRAVENOUS | Status: DC
  Filled 2021-10-23: qty 10

## 2021-10-23 MED ORDER — MAGNESIUM SULFATE IN D5W 1-5 GM/100ML-% IV SOLN
1.0000 g | Freq: Once | INTRAVENOUS | Status: AC
  Administered 2021-10-24: 1 g via INTRAVENOUS
  Filled 2021-10-23: qty 100

## 2021-10-23 MED ORDER — AMLODIPINE BESYLATE 10 MG PO TABS
10.0000 mg | ORAL_TABLET | Freq: Every day | ORAL | Status: DC
  Administered 2021-10-23: 10 mg via ORAL
  Filled 2021-10-23: qty 2

## 2021-10-23 MED ORDER — METHOCARBAMOL 500 MG PO TABS
750.0000 mg | ORAL_TABLET | Freq: Once | ORAL | Status: AC
  Administered 2021-10-23: 750 mg via ORAL
  Filled 2021-10-23: qty 2

## 2021-10-23 MED ORDER — CLONIDINE HCL 0.2 MG PO TABS
0.2000 mg | ORAL_TABLET | Freq: Two times a day (BID) | ORAL | Status: DC
  Administered 2021-10-23: 0.2 mg via ORAL
  Filled 2021-10-23: qty 2

## 2021-10-23 MED ORDER — ACETAMINOPHEN 325 MG PO TABS
650.0000 mg | ORAL_TABLET | Freq: Once | ORAL | Status: AC
  Administered 2021-10-23: 650 mg via ORAL
  Filled 2021-10-23: qty 2

## 2021-10-23 MED ORDER — LACTATED RINGERS IV BOLUS
500.0000 mL | Freq: Once | INTRAVENOUS | Status: AC
  Administered 2021-10-23: 500 mL via INTRAVENOUS

## 2021-10-23 MED ORDER — ALBUTEROL SULFATE HFA 108 (90 BASE) MCG/ACT IN AERS
4.0000 | INHALATION_SPRAY | Freq: Once | RESPIRATORY_TRACT | Status: AC
  Administered 2021-10-23: 4 via RESPIRATORY_TRACT
  Filled 2021-10-23: qty 6.7

## 2021-10-23 MED ORDER — APIXABAN 5 MG PO TABS: 5.0000 mg | ORAL_TABLET | Freq: Two times a day (BID) | ORAL | Status: DC

## 2021-10-23 MED ORDER — PREGABALIN 100 MG PO CAPS
300.0000 mg | ORAL_CAPSULE | Freq: Two times a day (BID) | ORAL | Status: DC
  Administered 2021-10-23: 300 mg via ORAL
  Filled 2021-10-23: qty 6

## 2021-10-23 MED ORDER — METHYLPREDNISOLONE SODIUM SUCC 125 MG IJ SOLR
125.0000 mg | Freq: Once | INTRAMUSCULAR | Status: AC
  Administered 2021-10-23: 125 mg via INTRAVENOUS
  Filled 2021-10-23: qty 2

## 2021-10-23 MED ORDER — LEVOTHYROXINE SODIUM 50 MCG PO TABS
50.0000 ug | ORAL_TABLET | Freq: Every day | ORAL | Status: DC
  Administered 2021-10-23: 50 ug via ORAL
  Filled 2021-10-23 (×2): qty 1

## 2021-10-23 MED ORDER — DILTIAZEM HCL ER COATED BEADS 120 MG PO CP24
120.0000 mg | ORAL_CAPSULE | Freq: Every day | ORAL | Status: DC
  Administered 2021-10-23: 120 mg via ORAL
  Filled 2021-10-23: qty 1

## 2021-10-23 MED ORDER — IPRATROPIUM-ALBUTEROL 0.5-2.5 (3) MG/3ML IN SOLN
3.0000 mL | Freq: Once | RESPIRATORY_TRACT | Status: AC
Start: 2021-10-23 — End: 2021-10-23
  Administered 2021-10-23: 3 mL via RESPIRATORY_TRACT
  Filled 2021-10-23: qty 3

## 2021-10-23 MED ORDER — ALBUTEROL SULFATE (2.5 MG/3ML) 0.083% IN NEBU
2.5000 mg | INHALATION_SOLUTION | Freq: Once | RESPIRATORY_TRACT | Status: AC
  Administered 2021-10-23: 2.5 mg via RESPIRATORY_TRACT
  Filled 2021-10-23: qty 3

## 2021-10-23 MED ORDER — ACETAMINOPHEN 500 MG PO TABS
1000.0000 mg | ORAL_TABLET | Freq: Once | ORAL | Status: AC
  Administered 2021-10-23: 1000 mg via ORAL
  Filled 2021-10-23: qty 2

## 2021-10-23 NOTE — ED Triage Notes (Signed)
Pt reports continued chest pain and SHOB since being discharged Thursday for admission for asthma. Pt reports minimal relief with breathing treatments.

## 2021-10-23 NOTE — ED Provider Triage Note (Addendum)
Emergency Medicine Provider Triage Evaluation Note  April [name] , a 51 y.o. female  was evaluated in triage.  Hx of asthma, HTN, pneumonia, CHF, OSA on CPAP. Pt complains of SOB, CP and states these sx started yesterday states sx came on "all of a sudden".  Seems she is having pain in her buttucks and headache as well.   No NV  Recent hospitalization 6/12-6/22 for dyspnea  Wears 5L chronically Uses inhalers  Review of Systems  Positive: CP, SOB, headache, butt pains Negative: Vomiting  Physical Exam  BP (!) 145/84 (BP Location: Left Wrist)   Pulse (!) 110   Temp 99.3 F (37.4 C) (Oral)   Resp 20   SpO2 98%  Gen:   Awake, uncomfortable Resp:  Tachypnea.  MSK:   Moves extremities without difficulty  Other:  Expiratory wheeze  Medical Decision Making  Medically screening exam initiated at 4:00 PM.  Appropriate orders placed.  April [name] was informed that the remainder of the evaluation will be completed by another provider, this initial triage assessment does not replace that evaluation, and the importance of remaining in the ED until their evaluation is complete.  Labs, cxr ? Exacerbation of chronic diz   Tedd Sias, [place] 10/23/21 1606    Pati Gallo [place], [place] 10/23/21 1607

## 2021-10-23 NOTE — ED Provider Notes (Signed)
[facility] DEPT Provider Note   CSN: [id number] Arrival date & time: 10/23/21  1543     History  Chief Complaint  Patient presents with   Shortness of Breath   Chest Pain    April [name] is a 51 y.o. female.   Shortness of Breath Associated symptoms: chest pain and wheezing   Chest Pain Associated symptoms: back pain, fatigue and shortness of breath   Patient presents for shortness of breath and chest pain.  Medical history includes GERD, atrial fibrillation, asthma, COPD, obesity, depression, HTN, HLD, anxiety, anemia, OSA, CHF, hypothyroidism, and rheumatoid arthritis.  She had a recent hospitalization 2 weeks ago for asthma exacerbation.  She was hospitalized for 10 days.  While hospitalized, she did require supplemental oxygen which was weaned off prior to discharge.  At time of discharge, she was advised to continue Eliquis, every 6 hours nebulizers, 40 mg Lasix daily, and steroid taper.  She states that she has been adherent to her prescribed medications.  She did take 20 mg of prednisone today.  She has been utilizing her nebulizers every 4 hours.  Yesterday, she developed a pain that she describes throughout her left side of her body.  This is most prominent in her lower back, buttocks, and leg.  She denies any injuries yesterday.  She states that she spent the whole day laying in bed.  She noticed the pain when she got up out of the bed at 11 PM yesterday.  Today, she had persistent left-sided pain and also developed worsened shortness of breath.     Home Medications Prior to Admission medications   Medication Sig Start Date End Date Taking? Authorizing Provider  albuterol (PROAIR HFA) 108 (90 Base) MCG/ACT inhaler Inhale 2 puffs into the lungs every 6 (six) hours as needed for wheezing or shortness of breath. 06/26/21   Baird Lyons D, MD  amLODipine (NORVASC) 10 MG tablet TAKE 1 TABLET(10 MG) BY MOUTH DAILY Patient taking differently: Take 10  mg by mouth daily. 08/27/21   [name], MD  apixaban (ELIQUIS) 5 MG TABS tablet Take 1 tablet (5 mg total) by mouth 2 (two) times daily. 06/06/21   [name], MD  arformoterol (BROVANA) 15 MCG/2ML NEBU Take 2 mLs (15 mcg total) by nebulization 2 (two) times daily. 10/18/21   Florencia Reasons, MD  atorvastatin (LIPITOR) 20 MG tablet Take 20 mg by mouth every morning. 10/02/20   [provider]  baclofen (LIORESAL) 20 MG tablet Take 20 mg by mouth daily as needed for muscle spasms (pain). 01/08/21   [provider]  clonazePAM (KLONOPIN) 0.5 MG tablet Take 1 tablet (0.5 mg total) by mouth daily as needed for anxiety. 10/18/21   Florencia Reasons, MD  cloNIDine (CATAPRES) 0.2 MG tablet Take 0.2 mg by mouth 2 (two) times daily.    [provider]  Diclofenac Sodium 3 % GEL Apply 1 application. topically daily as needed (pain). 08/03/21   [provider]  diltiazem (CARDIZEM CD) 120 MG 24 hr capsule Take 1 capsule (120 mg total) by mouth daily. 10/19/21   Florencia Reasons, MD  DULoxetine (CYMBALTA) 60 MG capsule Take 1 capsule (60 mg total) by mouth daily. Patient taking differently: Take 60 mg by mouth daily as needed (for anxiety or depression). 06/06/21   [name], MD  EPINEPHrine 0.3 mg/0.3 mL IJ SOAJ injection Inject 0.3 mg into the muscle as needed for anaphylaxis. 05/30/20   Deneise Lever, MD  fluticasone Asencion Islam)  50 MCG/ACT nasal spray SHAKE LIQUID AND USE 1 SPRAY IN EACH NOSTRIL DAILY AS NEEDED FOR ALLERGIES OR RHINITIS Patient taking differently: Place 1 spray into both nostrils daily. 08/05/21   [name], MD  Fluticasone-Umeclidin-Vilant (TRELEGY ELLIPTA) 100-62.5-25 MCG/INH AEPB Inhale 1 puff into the lungs daily. 01/23/21   Deneise Lever, MD  furosemide (LASIX) 40 MG tablet Take 1 tablet (40 mg total) by mouth daily. 10/18/21   Florencia Reasons, MD  ipratropium-albuterol (DUONEB) 0.5-2.5 (3) MG/3ML SOLN Take 3 mLs by nebulization every 6 (six) hours as needed (shortness of  breath/wheezing). 02/15/21   Deneise Lever, MD  levothyroxine (SYNTHROID) 50 MCG tablet Take 50 mcg by mouth daily. 10/15/20   [provider]  montelukast (SINGULAIR) 10 MG tablet Take 1 tablet (10 mg total) by mouth at bedtime. 09/13/21   [name], MD  naloxone [facility]) nasal spray 4 mg/0.1 mL Place 1 spray into the nose once as needed (opioid overdose). 08/09/20   [provider]  omeprazole (PRILOSEC) 40 MG capsule Take 40 mg by mouth daily.    [provider]  oxyCODONE-acetaminophen (PERCOCET) 10-325 MG tablet Take 1 tablet by mouth 5 (five) times daily as needed (nerve pain). 08/01/21   [provider]  phentermine 15 MG capsule Take 15 mg by mouth daily. 08/10/21   [provider]  potassium chloride SA (KLOR-CON M) 20 MEQ tablet TAKE 1 TABLET( 20 MEQ TOTAL) BY MOUTH DAILY Patient taking differently: Take 20 mEq by mouth daily. 09/27/21   [name], MD  predniSONE (DELTASONE) 10 MG tablet Prednisone dosing: Take  Prednisone '40mg'$  (4 tabs) x 3 days, then taper to '30mg'$  (3 tabs) x 3 days, then '20mg'$  (2 tabs) x 3days, then '10mg'$  (1 tab) x 3days, then OFF. 10/18/21   Florencia Reasons, MD  pregabalin (LYRICA) 300 MG capsule Take 300 mg by mouth 2 (two) times daily. 01/08/21   [provider]  revefenacin (YUPELRI) 175 MCG/3ML nebulizer solution Take 3 mLs (175 mcg total) by nebulization daily. 10/19/21   Florencia Reasons, MD  Semaglutide, 1 MG/DOSE, (OZEMPIC, 1 MG/DOSE,) 4 MG/3ML SOPN Inject 1 mg into the skin every Monday.    [provider]  Tezepelumab-ekko (TEZSPIRE) 210 MG/1.91ML SOAJ Inject 210 mg into the skin every 28 (twenty-eight) days. Courier to pulm: [id number] [place] [address], [place] [address]. Appointment on 11/05/21. 10/11/21   Deneise Lever, MD  tezepelumab-ekko (TEZSPIRE) 210 MG/1.91ML syringe Inject 210 mg into the skin every 28 (twenty-eight) days. 04/12/21   [provider]  topiramate (TOPAMAX) 200 MG tablet Take  200 mg by mouth at bedtime. 01/08/21   [provider]  zolpidem (AMBIEN) 10 MG tablet TAKE 1 TABLET BY MOUTH AT BEDTIME AS NEEDED FOR SLEEP Patient taking differently: Take 10 mg by mouth at bedtime. 07/25/21   Deneise Lever, MD  mometasone-formoterol (DULERA) 200-5 MCG/ACT AERO Inhale 2 puffs into the lungs 2 (two) times daily. Dx: Asthma 04/08/19 08/12/19  Deneise Lever, MD      Allergies    Contrast media [iodinated contrast media]    Review of Systems   Review of Systems  Constitutional:  Positive for activity change, appetite change and fatigue.  Respiratory:  Positive for chest tightness, shortness of breath and wheezing.   Cardiovascular:  Positive for chest pain.  Genitourinary:  Positive for dysuria, flank pain and frequency.  Musculoskeletal:  Positive for arthralgias and back pain.  All other systems reviewed and are negative.  Physical Exam Updated Vital Signs BP (!) 158/97   Pulse 100   Temp 99.3 F (37.4 C) (Oral)   Resp 19   SpO2 100%  Physical Exam Vitals and nursing note reviewed.  Constitutional:      General: She is not in acute distress.    Appearance: She is well-developed. She is obese. She is ill-appearing. She is not toxic-appearing or diaphoretic.  HENT:     Head: Normocephalic and atraumatic.     Mouth/Throat:     Mouth: Mucous membranes are moist.     Pharynx: Oropharynx is clear.  Eyes:     Extraocular Movements: Extraocular movements intact.     Conjunctiva/sclera: Conjunctivae normal.  Cardiovascular:     Rate and Rhythm: Regular rhythm. Tachycardia present.     Heart sounds: No murmur heard. Pulmonary:     Effort: Tachypnea present. No respiratory distress.     Breath sounds: Wheezing present. No rhonchi or rales.  Chest:     Chest wall: Tenderness present.  Abdominal:     Palpations: Abdomen is soft.     Tenderness: There is abdominal tenderness. There is right CVA tenderness and left CVA tenderness. There is no guarding  or rebound.  Musculoskeletal:        General: No swelling. Normal range of motion.     Cervical back: Normal range of motion and neck supple.     Right lower leg: No edema.     Left lower leg: No edema.  Skin:    General: Skin is warm and dry.     Capillary Refill: Capillary refill takes less than 2 seconds.     Coloration: Skin is not cyanotic or pale.  Neurological:     General: No focal deficit present.     Mental Status: She is alert and oriented to person, place, and time.  Psychiatric:        Mood and Affect: Mood normal.        Behavior: Behavior normal.     ED Results / Procedures / Treatments   Labs (all labs ordered are listed, but only abnormal results are displayed) Labs Reviewed  CBC - Abnormal; Notable for the following components:      Result Value   WBC 19.1 (*)    Hemoglobin 11.5 (*)    MCH 24.7 (*)    RDW 20.0 (*)    All other components within normal limits  COMPREHENSIVE METABOLIC PANEL - Abnormal; Notable for the following components:   CO2 21 (*)    Glucose, Bld 116 (*)    Creatinine, Ser 1.20 (*)    Albumin 3.3 (*)    GFR, Estimated 56 (*)    All other components within normal limits  URINALYSIS, ROUTINE W REFLEX MICROSCOPIC - Abnormal; Notable for the following components:   APPearance CLOUDY (*)    Leukocytes,Ua LARGE (*)    Bacteria, UA RARE (*)    All other components within normal limits  I-STAT CHEM 8, ED - Abnormal; Notable for the following components:   Creatinine, Ser 1.10 (*)    Glucose, Bld 118 (*)    All other components within normal limits  RESP PANEL BY RT-PCR (FLU A&B, COVID) ARPGX2  URINE CULTURE  BRAIN NATRIURETIC PEPTIDE  MAGNESIUM  TSH  CK  I-STAT BETA HCG BLOOD, ED ([facility], [facility], [facility] ONLY)  TROPONIN I (HIGH SENSITIVITY)  TROPONIN I (HIGH SENSITIVITY)    EKG EKG Interpretation  Date/Time:  Tuesday October 23 2021 15:58:06 EDT Ventricular  Rate:  114 PR Interval:  137 QRS Duration: 91 QT Interval:  327 QTC  Calculation: 451 R Axis:   28 Text Interpretation: Sinus tachycardia Probable left atrial enlargement Nonspecific T abnormalities, lateral leads Confirmed by Godfrey Pick [phone]) on 10/23/2021 4:49:23 PM  Radiology CT ABDOMEN PELVIS WO CONTRAST  Result Date: 10/23/2021 CLINICAL DATA:  Flank pain laterality not specified, chest pain and shortness of breath since being discharged last Thursday for asthma EXAM: CT ABDOMEN AND PELVIS WITHOUT CONTRAST TECHNIQUE: Multidetector CT imaging of the abdomen and pelvis was performed following the standard protocol without IV contrast. RADIATION DOSE REDUCTION: This exam was performed according to the departmental dose-optimization program which includes automated exposure control, adjustment of the mA and/or kV according to patient size and/or use of iterative reconstruction technique. COMPARISON:  03/29/2017 FINDINGS: Lower chest: Minimal atelectasis at lung bases. No pulmonary infiltrate or pleural effusion. Hepatobiliary: Gallbladder and liver unremarkable. Pancreas: Normal appearance Spleen: Normal appearance Adrenals/Urinary Tract: Adrenal glands and kidneys normal appearance. No renal mass, hydronephrosis, hydroureter, or ureteral calcification. Bladder unremarkable. Stomach/Bowel: Normal appendix. Scattered stool in colon. Stomach and bowel loops otherwise unremarkable. Vascular/Lymphatic: Aorta normal caliber with minimal atherosclerotic calcification. No adenopathy. Reproductive: Unremarkable uterus and LEFT ovary. Cyst within RIGHT ovary 2.8 cm diameter; No follow-up imaging recommended. Note: This recommendation does not apply to premenarchal patients and to those with increased risk (genetic, family history, elevated tumor markers or other high-risk factors) of ovarian cancer. Reference: JACR 2020 Feb; 17(2):248-254 Other: No free air or free fluid. Small periumbilical hernia containing fat. No inflammatory process. Musculoskeletal: Degenerative disc disease  changes L4-L5 and to lesser degree L5-S1. IMPRESSION: Small periumbilical hernia containing fat. No acute intra-abdominal or intrapelvic abnormalities. Aortic Atherosclerosis (ICD10-I70.0). Electronically Signed   By: Lavonia Dana M.D.   On: 10/23/2021 17:30   DG Chest 2 View  Result Date: 10/23/2021 CLINICAL DATA:  Chest pain and shortness of breath.  Asthma. EXAM: CHEST - 2 VIEW COMPARISON:  10/08/2021 FINDINGS: The heart size and mediastinal contours are within normal limits. Both lungs are clear. The visualized skeletal structures are unremarkable. IMPRESSION: No active cardiopulmonary disease. Electronically Signed   By: Marlaine Hind M.D.   On: 10/23/2021 16:32    Procedures Procedures    Medications Ordered in ED Medications  amLODipine (NORVASC) tablet 10 mg (10 mg Oral Given 10/23/21 2239)  clonazePAM (KLONOPIN) tablet 0.5 mg (0.5 mg Oral Given 10/23/21 2240)  cloNIDine (CATAPRES) tablet 0.2 mg (0.2 mg Oral Given 10/23/21 2241)  diltiazem (CARDIZEM CD) 24 hr capsule 120 mg (120 mg Oral Given 10/23/21 2315)  DULoxetine (CYMBALTA) DR capsule 60 mg (has no administration in time range)  levothyroxine (SYNTHROID) tablet 50 mcg (50 mcg Oral Given 10/23/21 2315)  pregabalin (LYRICA) capsule 300 mg (300 mg Oral Given 10/23/21 2238)  apixaban (ELIQUIS) tablet 5 mg (has no administration in time range)  magnesium sulfate IVPB 1 g 100 mL (has no administration in time range)  cefTRIAXone (ROCEPHIN) 1 g in sodium chloride 0.9 % 100 mL IVPB (has no administration in time range)  acetaminophen (TYLENOL) tablet 1,000 mg (1,000 mg Oral Given 10/23/21 1638)  albuterol (VENTOLIN HFA) 108 (90 Base) MCG/ACT inhaler 4 puff (4 puffs Inhalation Given 10/23/21 1638)  methylPREDNISolone sodium succinate (SOLU-MEDROL) 125 mg/2 mL injection 125 mg (125 mg Intravenous Given 10/23/21 1755)  ipratropium-albuterol (DUONEB) 0.5-2.5 (3) MG/3ML nebulizer solution 3 mL (3 mLs Nebulization Given 10/23/21 1757)  methocarbamol  (ROBAXIN) tablet 750 mg (750 mg Oral Given 10/23/21  1955)  lactated ringers bolus 500 mL (500 mLs Intravenous New Bag/Given 10/23/21 2041)  acetaminophen (TYLENOL) tablet 650 mg (650 mg Oral Given 10/23/21 2238)  albuterol (PROVENTIL) (2.5 MG/3ML) 0.083% nebulizer solution 2.5 mg (2.5 mg Nebulization Given 10/23/21 2315)    ED Course/ Medical Decision Making/ A&P Clinical Course as of 10/24/21 0026  Tue Oct 23, 2021  2025 CT ABDOMEN PELVIS WO CONTRAST [RD]    Clinical Course User Index [RD] Godfrey Pick, MD                           Medical Decision Making Amount and/or Complexity of Data Reviewed Labs: ordered. Radiology: ordered. Decision-making details documented in ED Course.  Risk OTC drugs. Prescription drug management. Decision regarding hospitalization.   This patient presents to the ED for concern of diffuse left-sided body aches and shortness of breath, this involves an extensive number of treatment options, and is a complaint that carries with it a high risk of complications and morbidity.  The differential diagnosis includes exacerbation of asthma, CHF, pickwickian syndrome, pneumonia, nephrolithiasis, pyelonephritis   Co morbidities that complicate the patient evaluation  GERD, atrial fibrillation, asthma, COPD, obesity, depression, HTN, HLD, anxiety, anemia, OSA, CHF, hypothyroidism, and rheumatoid arthritis   Additional history obtained:  Additional history obtained from N/A External records from outside source obtained and reviewed including EMR   Lab Tests:  I Ordered, and personally interpreted labs.  The pertinent results include: Creatinine is elevated from baseline.  Electrolytes are normal.  She does have a leukocytosis which was also present during her recent hospitalization.  This could be secondary to ongoing steroid use and/or acute infection.  Anemia is baseline.  Urinalysis is equivocal.   Imaging Studies ordered:  I ordered imaging studies  including chest x-ray, CT of abdomen and pelvis I independently visualized and interpreted imaging which showed no acute findings to explain the patient's discomfort I agree with the radiologist interpretation   Cardiac Monitoring: / EKG:  The patient was maintained on a cardiac monitor.  I personally viewed and interpreted the cardiac monitored which showed an underlying rhythm of: Sinus rhythm  Problem List / ED Course / Critical interventions / Medication management  Patient is a 51 year old female with extensive medical history and recent hospitalization, presenting for onset of left sided body aches since yesterday and shortness of breath since this morning.  Since her hospitalization, she has been on a steroid taper.  She took a 20 mg dose of prednisone today.  She has been using albuterol inhalers at home every 4 hours.  On arrival in the ED, vital signs are notable for hypertension and tachycardia.  On initial assessment, patient appears uncomfortable.  She is attempting different positions in the bed to get comfortable.  She seems to be more comfortable laying on her left (painful) side.  She does have diminished air movement in expiratory wheezing on lung auscultation.  Solu-Medrol and DuoNeb were ordered for treatment of asthma exacerbation.  Tylenol and Robaxin were ordered for symptomatic relief.  Laboratory work-up was initiated and results are notable for a increase in creatinine as well as a leukocytosis.  Leukocytosis was present during her most recent hospitalization and was attributed to steroid use.  Remaining lab work was unremarkable.  She has normal electrolytes and BNP is normal.  Urinalysis is equivocal.  When speaking with the patient further, she does state that she has had some dysuria recently.  Patient was given  ceftriaxone for empiric treatment of UTI.  Urine culture was sent.  On CT scan of the abdomen and pelvis, no kidney stones or any other findings were identified to  explain her discomfort.  On reassessment, patient continued to have wheezing and increased work of breathing.  Additional nebulizer was ordered and patient was admitted for further management. I ordered medication including Solu-Medrol and DuoNeb for asthma exacerbation; Tylenol and Robaxin for analgesia; IV fluids for AKI; ceftriaxone for UTI Reevaluation of the patient after these medicines showed that the patient improved I have reviewed the patients home medicines and have made adjustments as needed   Social Determinants of Health:  Frequent hospital admissions  CRITICAL CARE Performed by: Godfrey Pick   Total critical care time: 32 minutes  Critical care time was exclusive of separately billable procedures and treating other patients.  Critical care was necessary to treat or prevent imminent or life-threatening deterioration.  Critical care was time spent personally by me on the following activities: development of treatment plan with patient and/or surrogate as well as nursing, discussions with consultants, evaluation of patient's response to treatment, examination of patient, obtaining history from patient or surrogate, ordering and performing treatments and interventions, ordering and review of laboratory studies, ordering and review of radiographic studies, pulse oximetry and re-evaluation of patient's condition.         Final Clinical Impression(s) / ED Diagnoses Final diagnoses:  Severe persistent asthma with acute exacerbation  Acute cystitis without hematuria    Rx / DC Orders ED Discharge Orders     None         Godfrey Pick, MD 10/24/21 [phone]

## 2021-10-24 DIAGNOSIS — J4551 Severe persistent asthma with (acute) exacerbation: Secondary | ICD-10-CM

## 2021-10-24 DIAGNOSIS — F32A Depression, unspecified: Secondary | ICD-10-CM | POA: Diagnosis present

## 2021-10-24 DIAGNOSIS — F419 Anxiety disorder, unspecified: Secondary | ICD-10-CM | POA: Diagnosis present

## 2021-10-24 DIAGNOSIS — I480 Paroxysmal atrial fibrillation: Secondary | ICD-10-CM | POA: Diagnosis present

## 2021-10-24 DIAGNOSIS — Z8249 Family history of ischemic heart disease and other diseases of the circulatory system: Secondary | ICD-10-CM | POA: Diagnosis not present

## 2021-10-24 DIAGNOSIS — N179 Acute kidney failure, unspecified: Secondary | ICD-10-CM | POA: Diagnosis present

## 2021-10-24 DIAGNOSIS — M069 Rheumatoid arthritis, unspecified: Secondary | ICD-10-CM | POA: Diagnosis present

## 2021-10-24 DIAGNOSIS — J441 Chronic obstructive pulmonary disease with (acute) exacerbation: Secondary | ICD-10-CM | POA: Diagnosis present

## 2021-10-24 DIAGNOSIS — K219 Gastro-esophageal reflux disease without esophagitis: Secondary | ICD-10-CM | POA: Diagnosis present

## 2021-10-24 DIAGNOSIS — I110 Hypertensive heart disease with heart failure: Secondary | ICD-10-CM | POA: Diagnosis present

## 2021-10-24 DIAGNOSIS — I5032 Chronic diastolic (congestive) heart failure: Secondary | ICD-10-CM | POA: Diagnosis present

## 2021-10-24 DIAGNOSIS — E785 Hyperlipidemia, unspecified: Secondary | ICD-10-CM | POA: Diagnosis present

## 2021-10-24 DIAGNOSIS — E039 Hypothyroidism, unspecified: Secondary | ICD-10-CM | POA: Diagnosis present

## 2021-10-24 DIAGNOSIS — R300 Dysuria: Secondary | ICD-10-CM | POA: Diagnosis present

## 2021-10-24 DIAGNOSIS — Z87891 Personal history of nicotine dependence: Secondary | ICD-10-CM | POA: Diagnosis not present

## 2021-10-24 DIAGNOSIS — I10 Essential (primary) hypertension: Secondary | ICD-10-CM

## 2021-10-24 DIAGNOSIS — J9621 Acute and chronic respiratory failure with hypoxia: Secondary | ICD-10-CM | POA: Diagnosis present

## 2021-10-24 DIAGNOSIS — T502X5A Adverse effect of carbonic-anhydrase inhibitors, benzothiadiazides and other diuretics, initial encounter: Secondary | ICD-10-CM | POA: Diagnosis present

## 2021-10-24 DIAGNOSIS — R0602 Shortness of breath: Secondary | ICD-10-CM | POA: Diagnosis present

## 2021-10-24 DIAGNOSIS — G4733 Obstructive sleep apnea (adult) (pediatric): Secondary | ICD-10-CM | POA: Diagnosis present

## 2021-10-24 DIAGNOSIS — J9601 Acute respiratory failure with hypoxia: Secondary | ICD-10-CM | POA: Diagnosis not present

## 2021-10-24 DIAGNOSIS — Z79899 Other long term (current) drug therapy: Secondary | ICD-10-CM | POA: Diagnosis not present

## 2021-10-24 DIAGNOSIS — T380X6A Underdosing of glucocorticoids and synthetic analogues, initial encounter: Secondary | ICD-10-CM | POA: Diagnosis present

## 2021-10-24 DIAGNOSIS — Z825 Family history of asthma and other chronic lower respiratory diseases: Secondary | ICD-10-CM | POA: Diagnosis not present

## 2021-10-24 DIAGNOSIS — Z7901 Long term (current) use of anticoagulants: Secondary | ICD-10-CM | POA: Diagnosis not present

## 2021-10-24 DIAGNOSIS — N3000 Acute cystitis without hematuria: Secondary | ICD-10-CM | POA: Diagnosis not present

## 2021-10-24 DIAGNOSIS — Z1152 Encounter for screening for COVID-19: Secondary | ICD-10-CM | POA: Diagnosis not present

## 2021-10-24 LAB — CBC
HCT: 34.8 % — ABNORMAL LOW (ref 36.0–46.0)
Hemoglobin: 10.7 g/dL — ABNORMAL LOW (ref 12.0–15.0)
MCH: 25.4 pg — ABNORMAL LOW (ref 26.0–34.0)
MCHC: 30.7 g/dL (ref 30.0–36.0)
MCV: 82.7 fL (ref 80.0–100.0)
Platelets: 307 10*3/uL (ref 150–400)
RBC: 4.21 MIL/uL (ref 3.87–5.11)
RDW: 19.9 % — ABNORMAL HIGH (ref 11.5–15.5)
WBC: 16.3 10*3/uL — ABNORMAL HIGH (ref 4.0–10.5)
nRBC: 0 % (ref 0.0–0.2)

## 2021-10-24 LAB — BASIC METABOLIC PANEL
Anion gap: 7 (ref 5–15)
BUN: 18 mg/dL (ref 6–20)
CO2: 22 mmol/L (ref 22–32)
Calcium: 8.7 mg/dL — ABNORMAL LOW (ref 8.9–10.3)
Chloride: 112 mmol/L — ABNORMAL HIGH (ref 98–111)
Creatinine, Ser: 0.87 mg/dL (ref 0.44–1.00)
GFR, Estimated: >60 mL/min (ref >60)
Glucose, Bld: 160 mg/dL — ABNORMAL HIGH (ref 70–99)
Potassium: 4.3 mmol/L (ref 3.5–5.1)
Sodium: 141 mmol/L (ref 135–145)

## 2021-10-24 LAB — URINE CULTURE

## 2021-10-24 MED ORDER — AMLODIPINE BESYLATE 10 MG PO TABS
10.0000 mg | ORAL_TABLET | Freq: Every day | ORAL | Status: DC
  Administered 2021-10-24 – 2021-10-25 (×2): 10 mg via ORAL
  Filled 2021-10-24 (×2): qty 1

## 2021-10-24 MED ORDER — ZOLPIDEM TARTRATE 5 MG PO TABS
10.0000 mg | ORAL_TABLET | Freq: Every day | ORAL | Status: DC
  Administered 2021-10-24 – 2021-10-25 (×2): 10 mg via ORAL
  Filled 2021-10-24 (×2): qty 2

## 2021-10-24 MED ORDER — ALBUTEROL SULFATE (2.5 MG/3ML) 0.083% IN NEBU
2.5000 mg | INHALATION_SOLUTION | RESPIRATORY_TRACT | Status: DC | PRN
Start: 2021-10-24 — End: 2021-10-26

## 2021-10-24 MED ORDER — FLUTICASONE FUROATE-VILANTEROL 100-25 MCG/ACT IN AEPB: 1.0000 | INHALATION_SPRAY | Freq: Every day | RESPIRATORY_TRACT | Status: DC

## 2021-10-24 MED ORDER — APIXABAN 5 MG PO TABS
5.0000 mg | ORAL_TABLET | Freq: Two times a day (BID) | ORAL | Status: DC
  Administered 2021-10-24 – 2021-10-25 (×4): 5 mg via ORAL
  Filled 2021-10-24 (×4): qty 1

## 2021-10-24 MED ORDER — ARFORMOTEROL TARTRATE 15 MCG/2ML IN NEBU: 15.0000 ug | INHALATION_SOLUTION | Freq: Two times a day (BID) | RESPIRATORY_TRACT | Status: DC

## 2021-10-24 MED ORDER — ORAL CARE MOUTH RINSE: 15.0000 mL | OROMUCOSAL | Status: DC | PRN

## 2021-10-24 MED ORDER — STERILE WATER FOR INJECTION IJ SOLN
INTRAMUSCULAR | Status: AC
  Administered 2021-10-24: 10 mL
  Filled 2021-10-24: qty 10

## 2021-10-24 MED ORDER — OXYCODONE-ACETAMINOPHEN 5-325 MG PO TABS
1.0000 | ORAL_TABLET | Freq: Every day | ORAL | Status: DC | PRN
  Administered 2021-10-24 – 2021-10-25 (×5): 1 via ORAL
  Filled 2021-10-24 (×5): qty 1

## 2021-10-24 MED ORDER — IPRATROPIUM-ALBUTEROL 0.5-2.5 (3) MG/3ML IN SOLN
3.0000 mL | Freq: Four times a day (QID) | RESPIRATORY_TRACT | Status: DC
Start: 2021-10-24 — End: 2021-10-24
  Administered 2021-10-24: 3 mL via RESPIRATORY_TRACT
  Filled 2021-10-24: qty 3

## 2021-10-24 MED ORDER — DILTIAZEM HCL ER COATED BEADS 120 MG PO CP24
120.0000 mg | ORAL_CAPSULE | Freq: Every day | ORAL | Status: DC
  Administered 2021-10-24 – 2021-10-25 (×2): 120 mg via ORAL
  Filled 2021-10-24 (×2): qty 1

## 2021-10-24 MED ORDER — ACETAMINOPHEN 650 MG RE SUPP: 650.0000 mg | Freq: Four times a day (QID) | RECTAL | Status: DC | PRN

## 2021-10-24 MED ORDER — SODIUM CHLORIDE 0.9 % IV SOLN
2.0000 g | INTRAVENOUS | Status: AC
  Administered 2021-10-24 – 2021-10-25 (×2): 2 g via INTRAVENOUS
  Filled 2021-10-24 (×2): qty 20

## 2021-10-24 MED ORDER — OXYCODONE-ACETAMINOPHEN 10-325 MG PO TABS: 1.0000 | ORAL_TABLET | Freq: Every day | ORAL | Status: DC | PRN

## 2021-10-24 MED ORDER — ACETAMINOPHEN 325 MG PO TABS
650.0000 mg | ORAL_TABLET | Freq: Four times a day (QID) | ORAL | Status: DC | PRN
  Administered 2021-10-24: 650 mg via ORAL
  Filled 2021-10-24: qty 2

## 2021-10-24 MED ORDER — CLONAZEPAM 0.5 MG PO TABS
0.5000 mg | ORAL_TABLET | Freq: Every day | ORAL | Status: DC | PRN
  Administered 2021-10-24 – 2021-10-25 (×2): 0.5 mg via ORAL
  Filled 2021-10-24 (×2): qty 1

## 2021-10-24 MED ORDER — SODIUM CHLORIDE 0.9 % IV SOLN
1.0000 g | Freq: Once | INTRAVENOUS | Status: AC
  Administered 2021-10-24: 1 g via INTRAVENOUS
  Filled 2021-10-24: qty 10

## 2021-10-24 MED ORDER — ALBUTEROL SULFATE HFA 108 (90 BASE) MCG/ACT IN AERS: 2.0000 | INHALATION_SPRAY | Freq: Four times a day (QID) | RESPIRATORY_TRACT | Status: DC | PRN

## 2021-10-24 MED ORDER — BUDESONIDE 0.25 MG/2ML IN SUSP
0.2500 mg | Freq: Two times a day (BID) | RESPIRATORY_TRACT | Status: DC
  Administered 2021-10-24: 0.25 mg via RESPIRATORY_TRACT
  Filled 2021-10-24: qty 2

## 2021-10-24 MED ORDER — OXYCODONE HCL 5 MG PO TABS
5.0000 mg | ORAL_TABLET | Freq: Every day | ORAL | Status: DC | PRN
  Administered 2021-10-24 – 2021-10-25 (×5): 5 mg via ORAL
  Filled 2021-10-24 (×6): qty 1

## 2021-10-24 MED ORDER — MOMETASONE FURO-FORMOTEROL FUM 200-5 MCG/ACT IN AERO
2.0000 | INHALATION_SPRAY | Freq: Two times a day (BID) | RESPIRATORY_TRACT | Status: DC
  Administered 2021-10-24 – 2021-10-26 (×5): 2 via RESPIRATORY_TRACT
  Filled 2021-10-24: qty 8.8

## 2021-10-24 MED ORDER — MONTELUKAST SODIUM 10 MG PO TABS
10.0000 mg | ORAL_TABLET | Freq: Every day | ORAL | Status: DC
  Administered 2021-10-24 – 2021-10-25 (×2): 10 mg via ORAL
  Filled 2021-10-24 (×2): qty 1

## 2021-10-24 MED ORDER — UMECLIDINIUM BROMIDE 62.5 MCG/ACT IN AEPB: 1.0000 | INHALATION_SPRAY | Freq: Every day | RESPIRATORY_TRACT | Status: DC

## 2021-10-24 MED ORDER — ALBUTEROL SULFATE (2.5 MG/3ML) 0.083% IN NEBU
3.0000 mL | INHALATION_SOLUTION | Freq: Four times a day (QID) | RESPIRATORY_TRACT | Status: DC
  Administered 2021-10-24 – 2021-10-26 (×8): 3 mL via RESPIRATORY_TRACT
  Filled 2021-10-24 (×8): qty 3

## 2021-10-24 MED ORDER — LEVOTHYROXINE SODIUM 50 MCG PO TABS
50.0000 ug | ORAL_TABLET | Freq: Every day | ORAL | Status: DC
  Administered 2021-10-24 – 2021-10-25 (×2): 50 ug via ORAL
  Filled 2021-10-24: qty 1

## 2021-10-24 MED ORDER — DULOXETINE HCL 60 MG PO CPEP: 60.0000 mg | ORAL_CAPSULE | Freq: Every day | ORAL | Status: DC | PRN

## 2021-10-24 MED ORDER — METHYLPREDNISOLONE SODIUM SUCC 125 MG IJ SOLR
60.0000 mg | Freq: Two times a day (BID) | INTRAMUSCULAR | Status: DC
  Administered 2021-10-24 – 2021-10-25 (×3): 60 mg via INTRAVENOUS
  Filled 2021-10-24 (×6): qty 2

## 2021-10-24 MED ORDER — ATORVASTATIN CALCIUM 20 MG PO TABS
20.0000 mg | ORAL_TABLET | Freq: Every morning | ORAL | Status: DC
  Administered 2021-10-24 – 2021-10-25 (×2): 20 mg via ORAL
  Filled 2021-10-24 (×2): qty 1

## 2021-10-24 NOTE — Progress Notes (Signed)
Patient request to speak with nursing administrator per assigned nurse. CN went to patient room to assess patient's concerns. Patient made generalization referencing being treated rudely by assigned staff. Patient requested to be moved to another department. Gave patient Dept. Director business card. Patient appeared to be agitated secondary to  not having a diet order. Assigned nurse made the admitting doctor aware of need for diet order x 2 pages and secure chat.  Awaiting diet order. Patient seemed appreciative of conversation upon CN leaving the room.

## 2021-10-24 NOTE — TOC Initial Note (Addendum)
Transition of Care [facility]) - Initial/Assessment Note    Patient Details  Name: April [name] MRN: [id number] Date of Birth: 16-Jan-1971  Transition of Care [facility]) CM/SW Contact:    Dessa Phi, RN Phone Number: 10/24/2021, 12:24 PM  Clinical Narrative: spoke to patient about d/c plans, & resources- [facility]-informed her to call to set up mail order, & delivery(voiced understanding)will check on [facility] agency to accept for [place] mgmnt/CSW-resources-f/u pharmacy concerns. States she has home 02 w/[facility]-rep Danielle aware to follow for home 02 orders. Patient states she doesn't have portable travel tank. Will await 02 sats, home 02 order within 48hrs of d/c for [employer] to deliver portable tank to rm prior d/c.Patient drove herself to hospital-she has own transport home.               -2:28p-[facility] rep Caryl Pina accepted for HHRN/CSW-await orders.  Expected Discharge Plan: [place] Barriers to Discharge: Continued Medical Work up   Patient Goals and CMS Choice Patient states their goals for this hospitalization and ongoing recovery are:: Home CMS Medicare.gov Compare Post Acute Care list provided to:: Patient Choice offered to / list presented to : Patient  Expected Discharge Plan and Services Expected Discharge Plan: [place]   Discharge Planning Services: CM Consult Post Acute Care Choice: [place] arrangements for the past 2 months: Single Family Home                                      Prior Living Arrangements/Services Living arrangements for the past 2 months: Single Family Home Lives with:: Self Patient language and need for interpreter reviewed:: Yes Do you feel safe going back to the place where you live?: Yes      Need for Family Participation in Patient Care: Yes (Comment) Care giver support system in place?: Yes (comment) Current home services: DME ([employer]-02 prn-no portables) Criminal  Activity/Legal Involvement Pertinent to Current Situation/Hospitalization: No - Comment as needed  Activities of Daily Living Home Assistive Devices/Equipment: Oxygen, Nebulizer, CPAP ADL Screening (condition at time of admission) Patient's cognitive ability adequate to safely complete daily activities?: Yes Is the patient deaf or have difficulty hearing?: No Does the patient have difficulty seeing, even when wearing glasses/contacts?: No Does the patient have difficulty concentrating, remembering, or making decisions?: No Patient able to express need for assistance with ADLs?: Yes Does the patient have difficulty dressing or bathing?: No Independently performs ADLs?: Yes (appropriate for developmental age) Does the patient have difficulty walking or climbing stairs?: No Weakness of Legs: Both Weakness of Arms/Hands: None  Permission Sought/Granted Permission sought to share information with : Case Manager Permission granted to share information with : Yes, Verbal Permission Granted  Share Information with NAME: Case Manager           Emotional Assessment Appearance:: Appears stated age Attitude/Demeanor/Rapport: Gracious Affect (typically observed): Accepting Orientation: : Oriented to Self, Oriented to Place, Oriented to  Time, Oriented to Situation Alcohol / Substance Use: Not Applicable Psych Involvement: No (comment)  Admission diagnosis:  Acute exacerbation of COPD with asthma ([place]) [J44.1, J45.901] Acute asthma exacerbation [J45.901] Patient Active Problem List   Diagnosis Date Noted   Severe persistent asthma with acute exacerbation in adult 08/23/2021   Hypothyroidism 08/23/2021   Chronic heart failure with preserved ejection fraction (HFpEF) ([place]) 08/23/2021   Primary osteoarthritis of right knee 07/04/2021  Body mass index 50.0-59.9, adult ([place]) 07/04/2021   Painful orthopaedic hardware ([place]) 07/04/2021   COPD with exacerbation ([place]) 02/04/2021   Severe  persistent asthma 07/04/2020   Acute respiratory failure ([place]) 02/20/2019   CHF exacerbation ([place]) 02/20/2019   Hot flashes    Asthma-COPD overlap syndrome ([place]) 10/26/2018   Acute respiratory distress 08/03/2018   Acute exacerbation of chronic obstructive pulmonary disease (COPD) ([place]) 08/03/2018   Vitamin D deficiency 05/26/2018   Acute asthma exacerbation 05/16/2018   Urinary incontinence 11/17/2017   Nausea with vomiting 11/06/2017   Hypomagnesemia 10/27/2017   Dysphagia 10/27/2017   Physical deconditioning 10/25/2017   Acute maxillary sinusitis 10/24/2017   Emesis, persistent 10/08/2017   Volume depletion 10/08/2017   Chest pain 10/08/2017   SOB (shortness of breath) 10/08/2017   Debility 09/22/2017   AKI (acute kidney injury) ([place]) 08/18/2017   HCAP (healthcare-associated pneumonia) 08/06/2017   Chronic atrial fibrillation 07/29/2017   Acute respiratory failure with hypoxia (HCC) 07/07/2017   AF (paroxysmal atrial fibrillation) (HCC)    Dyspnea 02/04/2017   Asthma exacerbation 40/98/1191   Metabolic syndrome 47/82/9562   Moderate persistent asthma with exacerbation 10/19/2016   Normocytic anemia 10/02/2016   Elevated hemoglobin A1c 05/05/2016   GERD (gastroesophageal reflux disease) 08/30/2015   Anxiety and depression 08/30/2015   Generalized anxiety disorder 08/30/2015   Hyperlipidemia 12/08/2013   Seasonal allergic rhinitis 08/29/2013   Leukocytosis 10/21/2012   Tobacco abuse in remission 10/07/2012   COPD mixed type ([place]) 05/07/2012   Knee pain, bilateral 04/25/2011   Insomnia 03/14/2011   Obstructive sleep apnea 12/19/2010   Hypokalemia 08/13/2010   Cervical back pain with evidence of disc disease 04/08/2008   HTN (hypertension) 07/31/2006   Morbid obesity due to excess calories ([place]) 06/17/2006   Major depressive disorder, recurrent episode ([place]) 04/10/2006   PCP:  [name], MD Pharmacy:   [facility] [place], [place] [address] Phone: [phone] Fax: [phone]  [place] [address] Phone: [phone] Fax: [phone]     Social Determinants of Health (SDOH) Interventions    Readmission Risk Interventions    10/16/2021   11:39 AM 10/09/2021   10:48 AM 08/29/2021    2:23 PM  Readmission Risk Prevention Plan  Transportation Screening Complete Complete Complete  Medication Review ([place]) Complete Complete Complete  PCP or Specialist appointment within 3-5 days of discharge Complete Complete Complete  HRI or Home Care Consult Complete Complete Complete  SW Recovery Care/Counseling Consult  Complete Complete  Palliative Care Screening  Not Applicable Not [place]  Not Applicable Not Applicable

## 2021-10-24 NOTE — Progress Notes (Signed)
[facility] PROGRESS NOTE    Progress Note  April [name]  VHQ:[id number] DOB: May 24, 1970 DOA: 10/23/2021 PCP: [name], MD     Brief Narrative:   April [name] is an 51 y.o. female past medical history significant for severe persistent asthma, chronic hypoxic respiratory failure, chronic diastolic heart failure paroxysmal atrial fibrillation on Eliquis, essential hypertension obstructive sleep apnea noncompliant with her CPAP, rheumatoid arthritis, morbid obesity with a BMI greater than 50 recently discharged on 10/18/2021 for an asthma exacerbation and possibly an acute decompensated diastolic heart failure was sent home on steroids and Lasix wean from oxygen prior to discharge comes into the ED for shortness of breath and chest tightness, increased work of breathing, white blood cell count of 19 cardiac biomarkers negative x2 EMB of 19.  She relates she has not been able to afford her inhalers due to financial reasons she is only using her albuterol she relate she was cleaning her house with bleach yesterday when she started feeling shortness of breath and started wheezing    Assessment/Plan:   Acute respiratory failure with hypoxia secondary to  Acute asthma exacerbation Noncompliant with her medication due to to financial cost. She was started on steroids and inhalers scheduled. Placed on 4 L of oxygen.  Durations have been greater than 94%. We will try to wean to room air. Started on Pulmicort we will consult TOC to assist with medications.  Acute kidney injury: Likely due to overdiuresis diuretics were held she was fluid resuscitated in the ED her creatinine returned to baseline.  Dysuria: UA was negative for nitrates large amounts of leukocytes 21-50 white blood cells or bacteria she was started on Rocephin urine cultures were sent.  Chest pain: Likely due to bronchospasm improved with bronchodilator treatment.  Chronic diastolic heart failure:  No signs of volume  overload Lasix was held due to acute kidney injury.  Essential sleep apnea: Continue CPAP at night.  Paroxysmal atrial fibrillation: Currently in sinus rhythm continue Eliquis.  Essential hypertension: Continue current home regimen.  Hypothyroidism: Continue Synthroid.    DVT prophylaxis: lovenox Family Communication:none Status is: Inpatient Remains inpatient appropriate because: Acute respiratory failure with hypoxia likely due to Association in the setting of inhaled agents at home    Code Status:     Code Status Orders  (From admission, onward)           Start     Ordered   10/24/21 0741  Full code  Continuous        10/24/21 0742           Code Status History     Date Active Date Inactive Code Status Order ID Comments User Context   10/09/2021 0301 10/18/2021 1610 Full Code [id number]  Toy Baker, MD Inpatient   08/26/2021 2234 08/30/2021 1719 Full Code [id number]  Shela Leff, MD Inpatient   08/23/2021 2319 08/24/2021 0854 Full Code [id number]  Lenore Cordia, MD ED   05/23/2021 1744 05/26/2021 1730 Full Code [id number]  Terrilee Croak, MD ED   02/03/2021 2258 02/07/2021 1554 Full Code [id number]  Marcelyn Bruins, MD ED   02/20/2019 0743 02/20/2019 1648 Full Code [id number]  Shela Leff, MD ED   02/14/2019 0453 02/14/2019 1237 Full Code [id number]  [name], DO ED   10/26/2018 0456 10/27/2018 1806 Full Code [id number]  [name], MD Inpatient   08/02/2018 2303 08/04/2018 1532 Full Code [id number]  [name], MD ED   05/16/2018 0548 05/20/2018 1739 Full Code  [id number]  [name], MD ED   10/08/2017 1637 10/15/2017 1722 Full Code [id number]  [name], MD ED   08/07/2017 0405 08/18/2017 2018 Full Code [id number]  Merton Border, MD ED   07/29/2017 0627 08/06/2017 1524 Full Code [id number]  [name], MD ED   07/07/2017 0717 07/12/2017 2008 Full Code [id number]  Rondel Jumbo, PA-C ED   06/06/2017 0836 06/08/2017 2107 Full Code  [id number]  [name], MD ED   05/26/2017 0617 06/01/2017 1653 Full Code [id number]  [name], MD ED   04/27/2017 1623 04/30/2017 1742 Full Code [id number]  Rondel Jumbo, PA-C ED   02/15/2017 0226 02/16/2017 1806 Full Code [id number]  [name], MD Inpatient   02/11/2017 0038 02/14/2017 0537 Full Code [id number]  [name], MD ED   01/27/2017 1024 02/01/2017 1751 Full Code [id number]  Jani Gravel, MD ED   01/17/2017 0514 01/17/2017 2021 Full Code [id number]  [name], MD ED   01/01/2017 0818 01/02/2017 1920 Full Code [id number]  [name], NP ED   12/21/2016 2015 12/22/2016 1723 Full Code [id number]  [name], MD ED   12/10/2016 1852 12/13/2016 1229 Full Code [id number]  [name], MD ED   11/13/2016 2017 11/15/2016 1758 Full Code [id number]  [name], MD ED   10/19/2016 0204 10/20/2016 1755 Full Code [id number]  [name], MD ED   10/02/2016 0553 10/04/2016 2204 Full Code [id number]  [name], MD Inpatient   06/22/2016 2104 06/25/2016 1707 Full Code [id number]  [name], DO ED   05/05/2016 2044 05/07/2016 1601 Full Code [id number]  [name], MD Inpatient   01/30/2016 1937 01/31/2016 1801 Full Code [id number]  [name], MD Inpatient   12/19/2015 0640 12/21/2015 1851 Full Code [id number]  [name], MD Inpatient   11/27/2015 0019 11/27/2015 2031 Full Code [id number]  [name], MD ED   11/10/2015 1408 11/12/2015 1641 Full Code [id number]  [name], MD ED   10/24/2015 0933 10/26/2015 1626 Full Code [id number]  [name], MD ED   08/22/2015 2052 08/24/2015 1700 Full Code [id number]  [name], MD ED   08/14/2015 1624 08/15/2015 1644 Full Code [id number]  [name], MD ED   07/12/2015 1429 07/13/2015 0015 Full Code [id number]  [name], MD Inpatient   05/23/2015 1650 05/24/2015 1819 Full Code [id number]  [name], MD ED   03/13/2015 1143 03/14/2015 1742 Full Code [id number]  [name], MD  Inpatient   11/03/2014 0444 11/03/2014 1823 Full Code [id number]  [name], MD Inpatient   09/15/2014 1326 09/16/2014 1658 Full Code [id number]  [name] Inpatient   09/08/2014 1401 09/09/2014 1752 Full Code [id number]  [name], MD Inpatient   03/27/2014 2111 03/28/2014 1754 Full Code [id number]  [name], MD Inpatient   11/23/2013 1717 11/24/2013 0009 Full Code [id number]  Clinton Gallant, MD Inpatient   06/06/2013 2326 06/07/2013 1912 Full Code [id number]  [name], MD Inpatient   11/02/2012 1854 11/03/2012 1909 Full Code [id number]  [name], MD Inpatient   10/21/2012 1030 10/23/2012 1730 Full Code [id number]  [name], MD Inpatient   10/06/2012 2349 10/07/2012 0657 Full Code [id number]  [name], MD Inpatient   05/22/2012 2200 05/23/2012 1627 Full Code [id number]  [name], RN Inpatient   02/27/2012 0219 02/27/2012 1728 Full Code [id number]  [name], MD Inpatient  IV Access:   Peripheral IV   Procedures and diagnostic studies:   CT ABDOMEN PELVIS WO CONTRAST  Result Date: 10/23/2021 CLINICAL DATA:  Flank pain laterality not specified, chest pain and shortness of breath since being discharged last Thursday for asthma EXAM: CT ABDOMEN AND PELVIS WITHOUT CONTRAST TECHNIQUE: Multidetector CT imaging of the abdomen and pelvis was performed following the standard protocol without IV contrast. RADIATION DOSE REDUCTION: This exam was performed according to the departmental dose-optimization program which includes automated exposure control, adjustment of the mA and/or kV according to patient size and/or use of iterative reconstruction technique. COMPARISON:  03/29/2017 FINDINGS: Lower chest: Minimal atelectasis at lung bases. No pulmonary infiltrate or pleural effusion. Hepatobiliary: Gallbladder and liver unremarkable. Pancreas: Normal appearance Spleen: Normal appearance Adrenals/Urinary Tract: Adrenal glands and kidneys normal appearance.  No renal mass, hydronephrosis, hydroureter, or ureteral calcification. Bladder unremarkable. Stomach/Bowel: Normal appendix. Scattered stool in colon. Stomach and bowel loops otherwise unremarkable. Vascular/Lymphatic: Aorta normal caliber with minimal atherosclerotic calcification. No adenopathy. Reproductive: Unremarkable uterus and LEFT ovary. Cyst within RIGHT ovary 2.8 cm diameter; No follow-up imaging recommended. Note: This recommendation does not apply to premenarchal patients and to those with increased risk (genetic, family history, elevated tumor markers or other high-risk factors) of ovarian cancer. Reference: JACR 2020 Feb; 17(2):248-254 Other: No free air or free fluid. Small periumbilical hernia containing fat. No inflammatory process. Musculoskeletal: Degenerative disc disease changes L4-L5 and to lesser degree L5-S1. IMPRESSION: Small periumbilical hernia containing fat. No acute intra-abdominal or intrapelvic abnormalities. Aortic Atherosclerosis (ICD10-I70.0). Electronically Signed   By: Lavonia Dana M.D.   On: 10/23/2021 17:30   DG Chest 2 View  Result Date: 10/23/2021 CLINICAL DATA:  Chest pain and shortness of breath.  Asthma. EXAM: CHEST - 2 VIEW COMPARISON:  10/08/2021 FINDINGS: The heart size and mediastinal contours are within normal limits. Both lungs are clear. The visualized skeletal structures are unremarkable. IMPRESSION: No active cardiopulmonary disease. Electronically Signed   By: Marlaine Hind M.D.   On: 10/23/2021 16:32     Medical Consultants:   None.   Subjective:    April [name] she relates her breathing is now better.  Objective:    Vitals:   10/23/21 2315 10/24/21 0024 10/24/21 0030 10/24/21 0518  BP:   (!) 159/78 (!) 135/93  Pulse: 100  100 85  Resp:  (!) 24 (!) 23 (!) 23  Temp:   98.8 F (37.1 C) 97.7 F (36.5 C)  TempSrc:   Oral Axillary  SpO2: 100%  94% 100%  Height:   '5\' 6"'$  (5.102 m)    SpO2: 100 %   Intake/Output Summary (Last 24 hours)  at 10/24/2021 0841 Last data filed at 10/24/2021 0400 Gross per 24 hour  Intake 200 ml  Output --  Net 200 ml   There were no vitals filed for this visit.  Exam: General exam: In no acute distress. Respiratory system: Good air movement and clear to auscultation. Cardiovascular system: S1 & S2 heard, RRR. No JVD. Gastrointestinal system: Abdomen is nondistended, soft and nontender.  Extremities: No pedal edema. Skin: No rashes, lesions or ulcers Psychiatry: Judgement and insight appear normal. Mood & affect appropriate.    Data Reviewed:    Labs: Basic Metabolic Panel: Recent Labs  Lab 10/18/21 0531 10/23/21 1632 10/23/21 1705  NA  --  140 140  K  --  4.0 3.9  CL  --  111 109  CO2  --  21*  --   GLUCOSE  --  116* 118*  BUN  --  20 19  CREATININE  --  1.20* 1.10*  CALCIUM  --  9.0  --   MG 2.4 2.2  --   PHOS 4.0  --   --    GFR Estimated Creatinine Clearance: 95.7 mL/min (A) (by C-G formula based on SCr of 1.1 mg/dL (H)). Liver Function Tests: Recent Labs  Lab 10/23/21 1632  AST 15  ALT 18  ALKPHOS 71  BILITOT 0.3  PROT 6.9  ALBUMIN 3.3*   No results for input(s): "LIPASE", "AMYLASE" in the last 168 hours. No results for input(s): "AMMONIA" in the last 168 hours. Coagulation profile No results for input(s): "INR", "PROTIME" in the last 168 hours. COVID-19 Labs  No results for input(s): "DDIMER", "FERRITIN", "LDH", "CRP" in the last 72 hours.  Lab Results  Component Value Date   SARSCOV2NAA NEGATIVE 10/23/2021   SARSCOV2NAA NEGATIVE 10/08/2021   [place] NEGATIVE 08/26/2021   [place] NEGATIVE 08/23/2021    CBC: Recent Labs  Lab 10/18/21 0531 10/23/21 1632 10/23/21 1705 10/24/21 0757  WBC 23.2* 19.1*  --  16.3*  NEUTROABS 19.1*  --   --   --   HGB 10.4* 11.5* 12.2 10.7*  HCT 34.7* 38.0 36.0 34.8*  MCV 81.6 81.5  --  82.7  PLT 404* 351  --  307   Cardiac Enzymes: Recent Labs  Lab 10/23/21 1849  CKTOTAL 88   BNP (last 3  results) No results for input(s): "PROBNP" in the last 8760 hours. CBG: Recent Labs  Lab 10/17/21 1123 10/17/21 1735 10/17/21 2317 10/18/21 0753  GLUCAP 405* 199* 255* 248*   D-Dimer: No results for input(s): "DDIMER" in the last 72 hours. Hgb A1c: No results for input(s): "HGBA1C" in the last 72 hours. Lipid Profile: No results for input(s): "CHOL", "HDL", "LDLCALC", "TRIG", "CHOLHDL", "LDLDIRECT" in the last 72 hours. Thyroid function studies: Recent Labs    10/23/21 1836  TSH 2.686   Anemia work up: No results for input(s): "VITAMINB12", "FOLATE", "FERRITIN", "TIBC", "IRON", "RETICCTPCT" in the last 72 hours. Sepsis Labs: Recent Labs  Lab 10/18/21 0531 10/23/21 1632 10/24/21 0757  WBC 23.2* 19.1* 16.3*   Microbiology Recent Results (from the past 240 hour(s))  Resp Panel by RT-PCR (Flu A&B, Covid) Anterior Nasal Swab     Status: None   Collection Time: 10/23/21  4:33 PM   Specimen: Anterior Nasal Swab  Result Value Ref Range Status   SARS Coronavirus 2 by RT PCR NEGATIVE NEGATIVE Final    Comment: (NOTE) SARS-CoV-2 target nucleic acids are NOT DETECTED.  The SARS-CoV-2 RNA is generally detectable in upper respiratory specimens during the acute phase of infection. The lowest concentration of SARS-CoV-2 viral copies this assay can detect is 138 copies/mL. A negative result does not preclude SARS-Cov-2 infection and should not be used as the sole basis for treatment or other patient management decisions. A negative result may occur with  improper specimen collection/handling, submission of specimen other than nasopharyngeal swab, presence of viral mutation(s) within the areas targeted by this assay, and inadequate number of viral copies(<138 copies/mL). A negative result must be combined with clinical observations, patient history, and epidemiological information. The expected result is Negative.  Fact Sheet for Patients:   EntrepreneurPulse.com.au  Fact Sheet for Healthcare Providers:  IncredibleEmployment.be  This test is no t yet approved or cleared by the [place] FDA and  has been authorized for detection and/or diagnosis of SARS-CoV-2 by FDA under an Emergency Use Authorization (EUA).  This EUA will remain  in effect (meaning this test can be used) for the duration of the COVID-19 declaration under Section 564(b)(1) of the Act, 21 U.S.C.section 360bbb-3(b)(1), unless the authorization is terminated  or revoked sooner.       Influenza A by PCR NEGATIVE NEGATIVE Final   Influenza B by PCR NEGATIVE NEGATIVE Final    Comment: (NOTE) The Xpert Xpress SARS-CoV-2/FLU/RSV plus assay is intended as an aid in the diagnosis of influenza from Nasopharyngeal swab specimens and should not be used as a sole basis for treatment. Nasal washings and aspirates are unacceptable for Xpert Xpress SARS-CoV-2/FLU/RSV testing.  Fact Sheet for Patients: EntrepreneurPulse.com.au  Fact Sheet for Healthcare Providers: IncredibleEmployment.be  This test is not yet approved or cleared by the [place] FDA and has been authorized for detection and/or diagnosis of SARS-CoV-2 by FDA under an Emergency Use Authorization (EUA). This EUA will remain in effect (meaning this test can be used) for the duration of the COVID-19 declaration under Section 564(b)(1) of the Act, 21 U.S.C. section 360bbb-3(b)(1), unless the authorization is terminated or revoked.  Performed at [facility], [place] [address] [place], [place] [address]      Medications:    budesonide (PULMICORT) nebulizer solution  0.25 mg Nebulization BID   ipratropium-albuterol  3 mL Nebulization Q6H   methylPREDNISolone (SOLU-MEDROL) injection  60 mg Intravenous Q12H   Continuous Infusions:  cefTRIAXone (ROCEPHIN)  IV        LOS: 0 days   Charlynne Cousins  [facility]  10/24/2021, 8:41 AM

## 2021-10-24 NOTE — H&P (Signed)
History and Physical    April [name] CXK:[id number] DOB: [birth year] DOA: 10/23/2021  PCP: [name], MD  Patient coming from: Home  Chief Complaint: Shortness of breath  HPI: April [name] is a 51 y.o. female with medical history significant of severe persistent asthma, chronic hypoxic respiratory failure, chronic diastolic CHF, paroxysmal A-fib on Eliquis, hypertension, hyperlipidemia, OSA noncompliant with CPAP, prediabetes, rheumatoid arthritis, hypothyroidism, class III obesity (BMI 54.52).  Recently admitted 6/12-6/22 for asthma exacerbation and decompensated CHF.  She was sent home on a steroid taper, Yupelri, Brovana, and Lasix 40 mg daily.  She was weaned off oxygen prior to discharge.   She presented to the ED with complaints of shortness of breath and chest pain.  In the ED, patient was wheezing and had increased work of breathing.  Labs significant for WBC 19.1, hemoglobin 11.5 (stable), platelet count 351k.  Sodium 140, potassium 4.0, chloride 111, bicarb 21, BUN 20, creatinine 1.2 (baseline 0.7-0.9), glucose 116.  High-sensitivity troponin negative x2.  BNP normal.  SARS-CoV-2 PCR and influenza panel negative.  Beta-hCG negative.  UA showing negative nitrite, large amount of leukocytes, 21-50 WBCs, and rare bacteria.  Urine culture pending.  Chest x-ray showing no active cardiopulmonary disease. Patient was given albuterol, Solu-Medrol, and IV magnesium. She was hypertensive and was given her home antihypertensives.  Patient was also given ceftriaxone and 500 cc LR bolus. She also endorsed left-sided lower back/buttock pain which was felt to be musculoskeletal and she was given Tylenol and Robaxin.  CK normal.  CT abdomen pelvis negative for acute finding.  Patient states after her recent hospital discharge she has been on a steroid taper and is taking Lasix.  However, she has not been able to afford [place] as they cost $2000.  She is only using albuterol as needed.   States yesterday she was cleaning her house with bleach when she started feeling short of breath again and started wheezing.  Also had substernal chest tightness at that time which has now resolved after she received breathing treatments.  She normally uses 5 L oxygen at home as needed but has been using it more often.  She is not using CPAP.  States the entire left side of her body from head to toe was hurting when she came into the emergency room but now improved after she received medications.  She is also endorsing dysuria and urinary frequency/urgency.  She does not want a heart healthy diet and repeatedly requesting a regular diet.  Review of Systems:  Review of Systems  All other systems reviewed and are negative.   Past Medical History:  Diagnosis Date   Acanthosis nigricans    Anxiety    Arthritis    "knees" (04/28/2017)   Asthma    Followed by Dr. Annamaria Boots (pulmonology); receives every other week omalizumab injections; has frequent exacerbations   Back pain    Chronic diastolic CHF (congestive heart failure) ([place]) 01/17/2017   COPD (chronic obstructive pulmonary disease) ([place])    PFTs in 2002, FEV1/FVC 65, no post bronchodilater test done   Depression    GERD (gastroesophageal reflux disease)    Headache(784.0)    "q couple days" (97/05/6376)   Helicobacter pylori (H. pylori) infection    Hypertension, essential    Insomnia    Joint pain    Lower extremity edema    Menorrhagia    Morbid obesity ([place])    OSA on CPAP    Sleep study 2008 - mild OSA,  not enough events to titrate CPAP; wears CPAP now/pt on 04/28/2017   Pneumonia X 1   Prediabetes    Rheumatoid arthritis (HCC)    Seasonal allergies    Shortness of breath    Tobacco user    Vitamin D deficiency     Past Surgical History:  Procedure Laterality Date   CARDIOVERSION N/A 05/30/2017   Procedure: CARDIOVERSION;  Surgeon: Sanda Klein, MD;  Location: [place];  Service: Cardiovascular;  Laterality: N/A;    REDUCTION MAMMAPLASTY Bilateral 09/2011   TUBAL LIGATION  1996   bilateral     reports that she quit smoking about 7 years ago. Her smoking use included cigarettes. She has a 13.00 pack-year smoking history. She has never used smokeless tobacco. She reports that she does not drink alcohol and does not use drugs.  Allergies  Allergen Reactions   Contrast Media [Iodinated Contrast Media] Itching    Ct contrast    Family History  Problem Relation Age of Onset   Hypertension Mother    Asthma Daughter    Cancer Paternal Aunt    Asthma Maternal Grandmother     Prior to Admission medications   Medication Sig Start Date End Date Taking? Authorizing Provider  albuterol (PROAIR HFA) 108 (90 Base) MCG/ACT inhaler Inhale 2 puffs into the lungs every 6 (six) hours as needed for wheezing or shortness of breath. 06/26/21   Baird Lyons D, MD  amLODipine (NORVASC) 10 MG tablet TAKE 1 TABLET(10 MG) BY MOUTH DAILY Patient taking differently: Take 10 mg by mouth daily. 08/27/21   [name], MD  apixaban (ELIQUIS) 5 MG TABS tablet Take 1 tablet (5 mg total) by mouth 2 (two) times daily. 06/06/21   [name], MD  arformoterol (BROVANA) 15 MCG/2ML NEBU Take 2 mLs (15 mcg total) by nebulization 2 (two) times daily. 10/18/21   Florencia Reasons, MD  atorvastatin (LIPITOR) 20 MG tablet Take 20 mg by mouth every morning. 10/02/20   [provider]  baclofen (LIORESAL) 20 MG tablet Take 20 mg by mouth daily as needed for muscle spasms (pain). 01/08/21   [provider]  clonazePAM (KLONOPIN) 0.5 MG tablet Take 1 tablet (0.5 mg total) by mouth daily as needed for anxiety. 10/18/21   Florencia Reasons, MD  cloNIDine (CATAPRES) 0.2 MG tablet Take 0.2 mg by mouth 2 (two) times daily.    [provider]  Diclofenac Sodium 3 % GEL Apply 1 application. topically daily as needed (pain). 08/03/21   [provider]  diltiazem (CARDIZEM CD) 120 MG 24 hr capsule Take 1 capsule (120 mg total) by mouth  daily. 10/19/21   Florencia Reasons, MD  DULoxetine (CYMBALTA) 60 MG capsule Take 1 capsule (60 mg total) by mouth daily. Patient taking differently: Take 60 mg by mouth daily as needed (for anxiety or depression). 06/06/21   [name], MD  EPINEPHrine 0.3 mg/0.3 mL IJ SOAJ injection Inject 0.3 mg into the muscle as needed for anaphylaxis. 05/30/20   Baird Lyons D, MD  fluticasone (FLONASE) 50 MCG/ACT nasal spray SHAKE LIQUID AND USE 1 SPRAY IN EACH NOSTRIL DAILY AS NEEDED FOR ALLERGIES OR RHINITIS Patient taking differently: Place 1 spray into both nostrils daily. 08/05/21   [name], MD  Fluticasone-Umeclidin-Vilant (TRELEGY ELLIPTA) 100-62.5-25 MCG/INH AEPB Inhale 1 puff into the lungs daily. 01/23/21   Deneise Lever, MD  furosemide (LASIX) 40 MG tablet Take 1 tablet (40 mg total) by mouth daily. 10/18/21   Florencia Reasons, MD  ipratropium-albuterol (  DUONEB) 0.5-2.5 (3) MG/3ML SOLN Take 3 mLs by nebulization every 6 (six) hours as needed (shortness of breath/wheezing). 02/15/21   Deneise Lever, MD  levothyroxine (SYNTHROID) 50 MCG tablet Take 50 mcg by mouth daily. 10/15/20   [provider]  montelukast (SINGULAIR) 10 MG tablet Take 1 tablet (10 mg total) by mouth at bedtime. 09/13/21   [name], MD  naloxone [facility]) nasal spray 4 mg/0.1 mL Place 1 spray into the nose once as needed (opioid overdose). 08/09/20   [provider]  omeprazole (PRILOSEC) 40 MG capsule Take 40 mg by mouth daily.    [provider]  oxyCODONE-acetaminophen (PERCOCET) 10-325 MG tablet Take 1 tablet by mouth 5 (five) times daily as needed (nerve pain). 08/01/21   [provider]  phentermine 15 MG capsule Take 15 mg by mouth daily. 08/10/21   [provider]  potassium chloride SA (KLOR-CON M) 20 MEQ tablet TAKE 1 TABLET( 20 MEQ TOTAL) BY MOUTH DAILY Patient taking differently: Take 20 mEq by mouth daily. 09/27/21   [name], MD  predniSONE (DELTASONE) 10 MG tablet  Prednisone dosing: Take  Prednisone '40mg'$  (4 tabs) x 3 days, then taper to '30mg'$  (3 tabs) x 3 days, then '20mg'$  (2 tabs) x 3days, then '10mg'$  (1 tab) x 3days, then OFF. 10/18/21   Florencia Reasons, MD  pregabalin (LYRICA) 300 MG capsule Take 300 mg by mouth 2 (two) times daily. 01/08/21   [provider]  revefenacin (YUPELRI) 175 MCG/3ML nebulizer solution Take 3 mLs (175 mcg total) by nebulization daily. 10/19/21   Florencia Reasons, MD  Semaglutide, 1 MG/DOSE, (OZEMPIC, 1 MG/DOSE,) 4 MG/3ML SOPN Inject 1 mg into the skin every Monday.    [provider]  Tezepelumab-ekko (TEZSPIRE) 210 MG/1.91ML SOAJ Inject 210 mg into the skin every 28 (twenty-eight) days. Courier to pulm: [address], [address], [place] [address]. Appointment on 11/05/21. 10/11/21   Deneise Lever, MD  tezepelumab-ekko (TEZSPIRE) 210 MG/1.91ML syringe Inject 210 mg into the skin every 28 (twenty-eight) days. 04/12/21   [provider]  topiramate (TOPAMAX) 200 MG tablet Take 200 mg by mouth at bedtime. 01/08/21   [provider]  zolpidem (AMBIEN) 10 MG tablet TAKE 1 TABLET BY MOUTH AT BEDTIME AS NEEDED FOR SLEEP Patient taking differently: Take 10 mg by mouth at bedtime. 07/25/21   Deneise Lever, MD  mometasone-formoterol (DULERA) 200-5 MCG/ACT AERO Inhale 2 puffs into the lungs 2 (two) times daily. Dx: Asthma 04/08/19 08/12/19  Deneise Lever, MD    Physical Exam: Vitals:   10/23/21 2315 10/24/21 0024 10/24/21 0030 10/24/21 0518  BP:   (!) 159/78 (!) 135/93  Pulse: 100  100 85  Resp:  (!) 24 (!) 23 (!) 23  Temp:   98.8 F (37.1 C) 97.7 F (36.5 C)  TempSrc:   Oral Axillary  SpO2: 100%  94% 100%  Height:   '5\' 6"'$  (1.676 m)     Physical Exam Vitals reviewed.  Constitutional:      General: She is not in acute distress. HENT:     Head: Normocephalic and atraumatic.  Eyes:     Extraocular Movements: Extraocular movements intact.  Cardiovascular:     Rate and Rhythm: Normal rate and regular  rhythm.     Pulses: Normal pulses.  Pulmonary:     Effort: Pulmonary effort is normal. No respiratory distress.     Breath sounds: Wheezing present. No rales.  Abdominal:  General: Bowel sounds are normal. There is no distension.     Palpations: Abdomen is soft.     Tenderness: There is no abdominal tenderness.  Musculoskeletal:        General: No swelling or tenderness.     Cervical back: Normal range of motion.  Skin:    General: Skin is warm and dry.  Neurological:     General: No focal deficit present.     Mental Status: She is alert and oriented to person, place, and time.      Labs on Admission: I have personally reviewed following labs and imaging studies  CBC: Recent Labs  Lab 10/18/21 0531 10/23/21 1632 10/23/21 1705  WBC 23.2* 19.1*  --   NEUTROABS 19.1*  --   --   HGB 10.4* 11.5* 12.2  HCT 34.7* 38.0 36.0  MCV 81.6 81.5  --   PLT 404* 351  --    Basic Metabolic Panel: Recent Labs  Lab 10/18/21 0531 10/23/21 1632 10/23/21 1705  NA  --  140 140  K  --  4.0 3.9  CL  --  111 109  CO2  --  21*  --   GLUCOSE  --  116* 118*  BUN  --  20 19  CREATININE  --  1.20* 1.10*  CALCIUM  --  9.0  --   MG 2.4 2.2  --   PHOS 4.0  --   --    GFR: Estimated Creatinine Clearance: 95.7 mL/min (A) (by C-G formula based on SCr of 1.1 mg/dL (H)). Liver Function Tests: Recent Labs  Lab 10/23/21 1632  AST 15  ALT 18  ALKPHOS 71  BILITOT 0.3  PROT 6.9  ALBUMIN 3.3*   No results for input(s): "LIPASE", "AMYLASE" in the last 168 hours. No results for input(s): "AMMONIA" in the last 168 hours. Coagulation Profile: No results for input(s): "INR", "PROTIME" in the last 168 hours. Cardiac Enzymes: Recent Labs  Lab 10/23/21 1849  CKTOTAL 88   BNP (last 3 results) No results for input(s): "PROBNP" in the last 8760 hours. HbA1C: No results for input(s): "HGBA1C" in the last 72 hours. CBG: Recent Labs  Lab 10/17/21 0723 10/17/21 1123 10/17/21 1735  10/17/21 2317 10/18/21 0753  GLUCAP 194* 405* 199* 255* 248*   Lipid Profile: No results for input(s): "CHOL", "HDL", "LDLCALC", "TRIG", "CHOLHDL", "LDLDIRECT" in the last 72 hours. Thyroid Function Tests: Recent Labs    10/23/21 1836  TSH 2.686   Anemia Panel: No results for input(s): "VITAMINB12", "FOLATE", "FERRITIN", "TIBC", "IRON", "RETICCTPCT" in the last 72 hours. Urine analysis:    Component Value Date/Time   COLORURINE YELLOW 10/23/2021 1826   APPEARANCEUR CLOUDY (A) 10/23/2021 1826   LABSPEC 1.027 10/23/2021 1826   PHURINE 5.0 10/23/2021 1826   GLUCOSEU NEGATIVE 10/23/2021 1826   GLUCOSEU NEG mg/dL 10/28/2007 2049   HGBUR NEGATIVE 10/23/2021 1826   BILIRUBINUR NEGATIVE 10/23/2021 1826   KETONESUR NEGATIVE 10/23/2021 1826   PROTEINUR NEGATIVE 10/23/2021 1826   UROBILINOGEN 1.0 11/21/2014 0707   NITRITE NEGATIVE 10/23/2021 1826   LEUKOCYTESUR LARGE (A) 10/23/2021 1826    Radiological Exams on Admission: I have personally reviewed images CT ABDOMEN PELVIS WO CONTRAST  Result Date: 10/23/2021 CLINICAL DATA:  Flank pain laterality not specified, chest pain and shortness of breath since being discharged last Thursday for asthma EXAM: CT ABDOMEN AND PELVIS WITHOUT CONTRAST TECHNIQUE: Multidetector CT imaging of the abdomen and pelvis was performed following the standard protocol without IV contrast. RADIATION  DOSE REDUCTION: This exam was performed according to the departmental dose-optimization program which includes automated exposure control, adjustment of the mA and/or kV according to patient size and/or use of iterative reconstruction technique. COMPARISON:  03/29/2017 FINDINGS: Lower chest: Minimal atelectasis at lung bases. No pulmonary infiltrate or pleural effusion. Hepatobiliary: Gallbladder and liver unremarkable. Pancreas: Normal appearance Spleen: Normal appearance Adrenals/Urinary Tract: Adrenal glands and kidneys normal appearance. No renal mass, hydronephrosis,  hydroureter, or ureteral calcification. Bladder unremarkable. Stomach/Bowel: Normal appendix. Scattered stool in colon. Stomach and bowel loops otherwise unremarkable. Vascular/Lymphatic: Aorta normal caliber with minimal atherosclerotic calcification. No adenopathy. Reproductive: Unremarkable uterus and LEFT ovary. Cyst within RIGHT ovary 2.8 cm diameter; No follow-up imaging recommended. Note: This recommendation does not apply to premenarchal patients and to those with increased risk (genetic, family history, elevated tumor markers or other high-risk factors) of ovarian cancer. Reference: JACR 2020 Feb; 17(2):248-254 Other: No free air or free fluid. Small periumbilical hernia containing fat. No inflammatory process. Musculoskeletal: Degenerative disc disease changes L4-L5 and to lesser degree L5-S1. IMPRESSION: Small periumbilical hernia containing fat. No acute intra-abdominal or intrapelvic abnormalities. Aortic Atherosclerosis (ICD10-I70.0). Electronically Signed   By: Lavonia Dana M.D.   On: 10/23/2021 17:30   DG Chest 2 View  Result Date: 10/23/2021 CLINICAL DATA:  Chest pain and shortness of breath.  Asthma. EXAM: CHEST - 2 VIEW COMPARISON:  10/08/2021 FINDINGS: The heart size and mediastinal contours are within normal limits. Both lungs are clear. The visualized skeletal structures are unremarkable. IMPRESSION: No active cardiopulmonary disease. Electronically Signed   By: Marlaine Hind M.D.   On: 10/23/2021 16:32    EKG: Independently reviewed.  Sinus tachycardia.  Assessment and Plan  Acute exacerbation of severe persistent asthma Acute on chronic hypoxic respiratory failure Recently admitted for asthma exacerbation and discharged on a steroid taper.  She was also prescribed [name] and Yupelri but was not able to pick them up from the pharmacy due to high cost.  Breathing became worse yesterday and she was cleaning her house with bleach.  Wheezing and had increased work of breathing on  arrival to the ED.  She was given Solu-Medrol, IV mag, and albuterol.  Continues to have wheezing but work of breathing has improved.  Currently stable on 4 L O2. -Continue IV Solu-Medrol 60 mg every 12 hours -DuoNeb every 6 hours -Pulmicort neb twice daily -Albuterol neb as needed -Continue supplemental oxygen -TOC consulted for medication assistance  AKI She was discharged on Lasix and creatinine is up from baseline.  Creatinine currently 1.2, baseline 0.7-0.9. -She was given IV fluids in the ED, repeat labs to check renal function -Hold Lasix and avoid any other nephrotoxic agents  Possible UTI SIRS/ ?sepsis Tachycardic and tachypneic in the setting of acute asthma exacerbation.  Leukocytosis in the setting of steroid use.  Afebrile.  She is endorsing urinary symptoms and UA showing negative nitrite, large amount of leukocytes, 21-50 WBCs, and rare bacteria. -Continue ceftriaxone -Urine culture pending -Monitor WBC count  Chest pain Likely due to bronchospasm as it resolved after bronchodilator treatments.  Work-up not suggestive of ACS.  Chronic diastolic CHF No signs of volume overload.  BNP is normal. -Hold Lasix at this time given AKI  OSA -She is not using CPAP.  Paroxysmal A-fib: Currently in sinus rhythm. Hypertension: Currently normotensive. Hyperlipidemia Hypothyroidism -Resume home meds after pharmacy med rec is done.  DVT prophylaxis: Continue Eliquis after pharmacy med rec is done. Code Status: Full Code Family Communication: No family available  at this time. Level of care: Telemetry bed Admission status: It is my clinical opinion that admission to INPATIENT is reasonable and necessary because of the expectation that this patient will require hospital care that crosses at least 2 midnights to treat this condition based on the medical complexity of the problems presented.  Given the aforementioned information, the predictability of an adverse outcome is felt to  be significant.   Shela Leff MD [facility]  If 7PM-7AM, please contact night-coverage [email or web address]  10/24/2021, 6:18 AM

## 2021-10-24 NOTE — Progress Notes (Signed)
While rounding on patient writer was asked by patient if anyone had been in her purse. I told patient that I had not been in her purse. I asked if there was anything of value missing or in her purse. Patient stated no. I asked patient if she would like security to come to room and secure anything of value for her. Patient declined and again stated that she did not have anything of value in her purse. Writer informed Camera operator of conversation.

## 2021-10-24 NOTE — Plan of Care (Signed)
  Problem: Clinical Measurements: Goal: Ability to maintain clinical measurements within normal limits will improve Outcome: Progressing Goal: Will remain free from infection Outcome: Progressing Goal: Diagnostic test results will improve Outcome: Progressing Goal: Cardiovascular complication will be avoided Outcome: Progressing   

## 2021-10-25 ENCOUNTER — Ambulatory Visit: Payer: Medicare Other

## 2021-10-25 ENCOUNTER — Other Ambulatory Visit (HOSPITAL_COMMUNITY): Payer: Self-pay

## 2021-10-25 ENCOUNTER — Other Ambulatory Visit: Payer: Self-pay | Admitting: Family Medicine

## 2021-10-25 ENCOUNTER — Telehealth: Payer: Self-pay | Admitting: Family Medicine

## 2021-10-25 DIAGNOSIS — I480 Paroxysmal atrial fibrillation: Secondary | ICD-10-CM | POA: Diagnosis not present

## 2021-10-25 DIAGNOSIS — N3000 Acute cystitis without hematuria: Secondary | ICD-10-CM

## 2021-10-25 DIAGNOSIS — J4551 Severe persistent asthma with (acute) exacerbation: Secondary | ICD-10-CM | POA: Diagnosis not present

## 2021-10-25 DIAGNOSIS — N179 Acute kidney failure, unspecified: Secondary | ICD-10-CM | POA: Diagnosis not present

## 2021-10-25 MED ORDER — OXYCODONE-ACETAMINOPHEN 10-325 MG PO TABS: 1.0000 | ORAL_TABLET | Freq: Every day | ORAL | Status: DC | PRN

## 2021-10-25 MED ORDER — ALUM & MAG HYDROXIDE-SIMETH 200-200-20 MG/5ML PO SUSP
30.0000 mL | ORAL | Status: DC | PRN
  Administered 2021-10-25: 30 mL via ORAL
  Filled 2021-10-25: qty 30

## 2021-10-25 MED ORDER — SIMETHICONE 80 MG PO CHEW
80.0000 mg | CHEWABLE_TABLET | Freq: Four times a day (QID) | ORAL | Status: DC | PRN
  Administered 2021-10-25 (×2): 80 mg via ORAL
  Filled 2021-10-25 (×2): qty 1

## 2021-10-25 MED ORDER — OXYCODONE HCL 5 MG PO TABS
5.0000 mg | ORAL_TABLET | Freq: Every day | ORAL | Status: DC | PRN
  Administered 2021-10-25: 5 mg via ORAL
  Filled 2021-10-25: qty 1

## 2021-10-25 MED ORDER — OXYCODONE-ACETAMINOPHEN 5-325 MG PO TABS
1.0000 | ORAL_TABLET | Freq: Every day | ORAL | Status: DC | PRN
  Administered 2021-10-25: 1 via ORAL
  Filled 2021-10-25 (×2): qty 1

## 2021-10-25 MED ORDER — BACLOFEN 20 MG PO TABS: 20.0000 mg | ORAL_TABLET | Freq: Every day | ORAL | Status: DC | PRN

## 2021-10-25 NOTE — Telephone Encounter (Signed)
Requested Prescriptions  Pending Prescriptions Disp Refills  . potassium chloride SA (KLOR-CON M) 20 MEQ tablet [Pharmacy Med Name: POTASSIUM CL 20MEQ ER TABLETS] 90 tablet 0    Sig: TAKE 1 TABLET(20 MEQ) BY MOUTH DAILY     Endocrinology:  Minerals - Potassium Supplementation Passed - 10/25/2021 10:19 AM      Passed - K in normal range and within 360 days    Potassium  Date Value Ref Range Status  10/24/2021 4.3 3.5 - 5.1 mmol/L Final         Passed - Cr in normal range and within 360 days    Creat  Date Value Ref Range Status  05/21/2016 0.56 0.50 - 1.10 mg/dL Final   Creatinine, Ser  Date Value Ref Range Status  10/24/2021 0.87 0.44 - 1.00 mg/dL Final         Passed - Valid encounter within last 12 months    Recent Outpatient Visits          4 months ago Screening for colon cancer   [place], [place], MD   8 months ago Moderate persistent asthma with acute exacerbation   [place], Charlane Ferretti, MD   1 year ago Encounter for [employer] annual wellness exam   [place], [place], MD   1 year ago Hot flashes   [place], [place]ong, MD   1 year ago Moderate persistent asthma with exacerbation   [place], MD      Future Appointments            In 2 weeks [name], MD [place]   In 2 weeks Young, Kasandra Knudsen, MD [facility]

## 2021-10-25 NOTE — Evaluation (Signed)
Physical Therapy Evaluation Patient Details Name: April [name] MRN: [id number] DOB: June 03, 1970 Today's Date: 10/25/2021  History of Present Illness  51 year old female recently admitted 10/08/21 with chest pain, SOB, asthma exacerbation.  Pt currently admitted 10/23/21 for Acute respiratory failure with hypoxia secondary to acute asthma exacerbation.  Pt with past medical history significant for asthma, COPD, A-fib on Eliquis, CHF, GERD, obesity, diabetes type 2 hypothyroidism.  Clinical Impression  Pt admitted with above diagnosis.  Pt currently with functional limitations due to the deficits listed below (see PT Problem List). Pt will benefit from skilled PT to increase their independence and safety with mobility to allow discharge to the venue listed below.  Pt only able to tolerate short distance ambulation due to c/o dizziness.  Vitals below.  Pt has RW at home, also w/c however reports w/c doesn't fit well around apartment.  Pt encouraged to ambulate with staff during acute stay. No f/u PT needs anticipated upon d/c.   10/25/21 1246  Vital Signs  Pulse Rate (!) 114  Pulse Rate Source Dinamap  BP (!) 143/102  BP Location Left Arm (forearm)  BP Method Automatic  Patient Position (if appropriate) Sitting  Oxygen Therapy  SpO2 94 %  O2 Device Room Air  Patient Activity (if Appropriate) In chair  Pulse Oximetry Type Intermittent           Recommendations for follow up therapy are one component of a multi-disciplinary discharge planning process, led by the attending physician.  Recommendations may be updated based on patient status, additional functional criteria and insurance authorization.  Follow Up Recommendations No PT follow up      Assistance Recommended at Discharge None  Patient can return home with the following  Help with stairs or ramp for entrance    Equipment Recommendations None recommended by PT  Recommendations for Other Services       Functional Status  Assessment Patient has had a recent decline in their functional status and demonstrates the ability to make significant improvements in function in a reasonable and predictable amount of time.     Precautions / Restrictions Precautions Precautions: None      Mobility  Bed Mobility               General bed mobility comments: pt in recliner    Transfers Overall transfer level: Independent                 General transfer comment: ad lib in room    Ambulation/Gait Ambulation/Gait assistance: Supervision Gait Distance (Feet): 25 Feet (total) Assistive device: None Gait Pattern/deviations: WFL(Within Functional Limits)       General Gait Details: pt ambulated to bathroom and then just into hallway before stating she was dizzy and unable to continue, SpO2 94% on room air  Stairs            Wheelchair Mobility    Modified Rankin (Stroke Patients Only)       Balance Overall balance assessment: No apparent balance deficits (not formally assessed)                                           Pertinent Vitals/Pain Pain Assessment Pain Assessment: No/denies pain    Home Living Family/patient expects to be discharged to:: Private residence Living Arrangements: Alone   Type of Home: Apartment Home Access: Stairs to enter Entrance Stairs-Rails: Right  Entrance Stairs-Number of Steps: 2-3   Home Layout: One level Home Equipment: Conservation officer, nature (2 wheels);Wheelchair - manual Additional Comments: w/c doesn't fit "in areas"    Prior Function Prior Level of Function : Independent/Modified Independent;Driving             Mobility Comments: walked without AD, denies falls in past 6 months       Hand Dominance        Extremity/Trunk Assessment   Upper Extremity Assessment Upper Extremity Assessment: Overall WFL for tasks assessed    Lower Extremity Assessment Lower Extremity Assessment: Overall WFL for tasks assessed        Communication      Cognition Arousal/Alertness: Awake/alert Behavior During Therapy: WFL for tasks assessed/performed Overall Cognitive Status: Within Functional Limits for tasks assessed                                          General Comments      Exercises     Assessment/Plan    PT Assessment Patient needs continued PT services  PT Problem List Decreased mobility;Cardiopulmonary status limiting activity       PT Treatment Interventions Gait training;DME instruction;Therapeutic exercise;Functional mobility training;Therapeutic activities;Stair training;Patient/family education    PT Goals (Current goals can be found in the Care Plan section)  Acute Rehab PT Goals PT Goal Formulation: With patient Time For Goal Achievement: 11/08/21 Potential to Achieve Goals: Good    Frequency Min 3X/week     Co-evaluation               AM-PAC PT "6 Clicks" Mobility  Outcome Measure Help needed turning from your back to your side while in a flat bed without using bedrails?: None Help needed moving from lying on your back to sitting on the side of a flat bed without using bedrails?: None Help needed moving to and from a bed to a chair (including a wheelchair)?: None Help needed standing up from a chair using your arms (e.g., wheelchair or bedside chair)?: None Help needed to walk in hospital room?: A Little Help needed climbing 3-5 steps with a railing? : A Little 6 Click Score: 22    End of Session   Activity Tolerance: Other (comment) (pt limited by dizziness) Patient left: in chair;with call bell/phone within reach Nurse Communication: Mobility status PT Visit Diagnosis: Difficulty in walking, not elsewhere classified (R26.2)    Time: 9518-8416 PT Time Calculation (min) (ACUTE ONLY): 12 min   Charges:   PT Evaluation $PT Eval Low Complexity: 1 Low        Kati PT, DPT Physical Therapist Acute Rehabilitation Services Preferred contact  method: Secure Chat Weekend Pager Only: [phone] Office: [place] 10/25/2021, 1:15 PM

## 2021-10-25 NOTE — Telephone Encounter (Signed)
Call returned to patient.  She wanted to inform me of her progress  in the hospital. Possible discharge tomorrow. She said the edema in her leg is getting better, She was seen by PT and has an appointment for outpatient PT scheduled for 10/29/2021.

## 2021-10-25 NOTE — Progress Notes (Signed)
2nd attempt to see patient to assess for PIV placement. Patient is in the BR at this time. Notified nurse and requested that consult be updated when patient is back in bed and ready for IV team to assess for PIV placement. Fran Lowes, RN VAST

## 2021-10-25 NOTE — Progress Notes (Signed)
[facility] PROGRESS NOTE    Progress Note  April [name]  FXT:[id number] DOB: 15-Sep-1970 DOA: 10/23/2021 PCP: [name], MD     Brief Narrative:   April [name] is an 51 y.o. female past medical history significant for severe persistent asthma, chronic hypoxic respiratory failure, chronic diastolic heart failure paroxysmal atrial fibrillation on Eliquis, essential hypertension obstructive sleep apnea noncompliant with her CPAP, rheumatoid arthritis, morbid obesity with a BMI greater than 50 recently discharged on 10/18/2021 for an asthma exacerbation and possibly an acute decompensated diastolic heart failure was sent home on steroids and Lasix wean from oxygen prior to discharge comes into the ED for shortness of breath and chest tightness, increased work of breathing, white blood cell count of 19 cardiac biomarkers negative x2 EMB of 19.  She relates she has not been able to afford her inhalers due to financial reasons she is only using her albuterol she relate she was cleaning her house with bleach yesterday when she started feeling shortness of breath and started wheezing    Assessment/Plan:   Acute respiratory failure with hypoxia secondary to  Acute asthma exacerbation Noncompliant with her medication due to to financial cost. Continue scheduled inhalers and steroids. According to epic she still on 4 L satting greater 96%. We need to wean to room air.  She uses oxygen nocturnally.  Acute kidney injury: Likely prerenal azotemia resolved with fluid resuscitation.  Dysuria: UA was negative for nitrates large amounts of leukocytes 21-50, start empirically on IV Rocephin urine cultures are pending.  Chest pain: Likely due to bronchospasm improved with bronchodilator treatment.  Chronic diastolic heart failure:  No signs of volume overload Lasix was held due to acute kidney injury.  Essential sleep apnea: Continue CPAP at night.  Paroxysmal atrial  fibrillation: Currently in sinus rhythm continue Eliquis.  Essential hypertension: Continue current home regimen.  Hypothyroidism: Continue Synthroid.  Possible narcotic seeking behavior: Consult physical therapy. Resume her home dose of narcotics.  DVT prophylaxis: lovenox Family Communication:none Status is: Inpatient Remains inpatient appropriate because: Acute respiratory failure with hypoxia likely due to Association in the setting of inhaled agents at home    Code Status:     Code Status Orders  (From admission, onward)           Start     Ordered   10/24/21 0741  Full code  Continuous        10/24/21 0742           Code Status History     Date Active Date Inactive Code Status Order ID Comments User Context   10/09/2021 0301 10/18/2021 1610 Full Code [id number]  Toy Baker, MD Inpatient   08/26/2021 2234 08/30/2021 1719 Full Code [id number]  Shela Leff, MD Inpatient   08/23/2021 2319 08/24/2021 0854 Full Code [id number]  Lenore Cordia, MD ED   05/23/2021 1744 05/26/2021 1730 Full Code [id number]  Terrilee Croak, MD ED   02/03/2021 2258 02/07/2021 1554 Full Code [id number]  Marcelyn Bruins, MD ED   02/20/2019 0743 02/20/2019 1648 Full Code [id number]  Shela Leff, MD ED   02/14/2019 0453 02/14/2019 1237 Full Code [id number]  [name], DO ED   10/26/2018 0456 10/27/2018 1806 Full Code [id number]  [name], MD Inpatient   08/02/2018 2303 08/04/2018 1532 Full Code [id number]  [name], MD ED   05/16/2018 0548 05/20/2018 1739 Full Code [id number]  [name], MD ED   10/08/2017 1637 10/15/2017 1722 Full Code [id number]  [name], MD ED   08/07/2017 0405 08/18/2017 2018 Full Code [id number]  Merton Border, MD ED   07/29/2017 0627 08/06/2017 1524 Full Code [id number]  [name], MD ED   07/07/2017 0717 07/12/2017 2008 Full Code [id number]  Rondel Jumbo, PA-C ED   06/06/2017 0836 06/08/2017 2107 Full Code [id number]  [name], MD ED   05/26/2017 0617 06/01/2017 1653 Full Code [id number]  [name], MD ED   04/27/2017 1623 04/30/2017 1742 Full Code [id number]  Rondel Jumbo, PA-C ED   02/15/2017 0226 02/16/2017 1806 Full Code [id number]  [name], MD Inpatient   02/11/2017 0038 02/14/2017 0537 Full Code [id number]  [name], MD ED   01/27/2017 1024 02/01/2017 1751 Full Code [id number]  Jani Gravel, MD ED   01/17/2017 0514 01/17/2017 2021 Full Code [id number]  [name], MD ED   01/01/2017 0818 01/02/2017 1920 Full Code [id number]  [name], NP ED   12/21/2016 2015 12/22/2016 1723 Full Code [id number]  [name], MD ED   12/10/2016 1852 12/13/2016 1229 Full Code [id number]  [name], MD ED   11/13/2016 2017 11/15/2016 1758 Full Code [id number]  [name], MD ED   10/19/2016 0204 10/20/2016 1755 Full Code [id number]  [name], MD ED   10/02/2016 0553 10/04/2016 2204 Full Code [id number]  [name], MD Inpatient   06/22/2016 2104 06/25/2016 1707 Full Code [id number]  [name], DO ED   05/05/2016 2044 05/07/2016 1601 Full Code [id number]  [name], MD Inpatient   01/30/2016 1937 01/31/2016 1801 Full Code [id number]  [name], MD Inpatient   12/19/2015 0640 12/21/2015 1851 Full Code [id number]  [name], MD Inpatient   11/27/2015 0019 11/27/2015 2031 Full Code [id number]  [name], MD ED   11/10/2015 1408 11/12/2015 1641 Full Code [id number]  [name], MD ED   10/24/2015 0933 10/26/2015 1626 Full Code [id number]  [name], MD ED   08/22/2015 2052 08/24/2015 1700 Full Code [id number]  [name], MD ED   08/14/2015 1624 08/15/2015 1644 Full Code [id number]  [name], MD ED   07/12/2015 1429 07/13/2015 0015 Full Code [id number]  [name], MD Inpatient   05/23/2015 1650 05/24/2015 1819 Full Code [id number]  [name], MD ED   03/13/2015 1143 03/14/2015 1742 Full Code [id number]  [name], MD Inpatient   11/03/2014 0444 11/03/2014  1823 Full Code [id number]  [name], MD Inpatient   09/15/2014 1326 09/16/2014 1658 Full Code [id number]  [name] Inpatient   09/08/2014 1401 09/09/2014 1752 Full Code [id number]  [name], MD Inpatient   03/27/2014 2111 03/28/2014 1754 Full Code [id number]  [name], MD Inpatient   11/23/2013 1717 11/24/2013 0009 Full Code [id number]  Clinton Gallant, MD Inpatient   06/06/2013 2326 06/07/2013 1912 Full Code [id number]  [name], MD Inpatient   11/02/2012 1854 11/03/2012 1909 Full Code [id number]  [name], MD Inpatient   10/21/2012 1030 10/23/2012 1730 Full Code [id number]  [name], MD Inpatient   10/06/2012 2349 10/07/2012 0657 Full Code [id number]  [name], MD Inpatient   05/22/2012 2200 05/23/2012 1627 Full Code [id number]  [name], RN Inpatient   02/27/2012 0219 02/27/2012 1728 Full Code [id number]  [name], MD Inpatient         IV Access:   Peripheral IV   Procedures and diagnostic studies:  CT ABDOMEN PELVIS WO CONTRAST  Result Date: 10/23/2021 CLINICAL DATA:  Flank pain laterality not specified, chest pain and shortness of breath since being discharged last Thursday for asthma EXAM: CT ABDOMEN AND PELVIS WITHOUT CONTRAST TECHNIQUE: Multidetector CT imaging of the abdomen and pelvis was performed following the standard protocol without IV contrast. RADIATION DOSE REDUCTION: This exam was performed according to the departmental dose-optimization program which includes automated exposure control, adjustment of the mA and/or kV according to patient size and/or use of iterative reconstruction technique. COMPARISON:  03/29/2017 FINDINGS: Lower chest: Minimal atelectasis at lung bases. No pulmonary infiltrate or pleural effusion. Hepatobiliary: Gallbladder and liver unremarkable. Pancreas: Normal appearance Spleen: Normal appearance Adrenals/Urinary Tract: Adrenal glands and kidneys normal appearance. No renal mass, hydronephrosis,  hydroureter, or ureteral calcification. Bladder unremarkable. Stomach/Bowel: Normal appendix. Scattered stool in colon. Stomach and bowel loops otherwise unremarkable. Vascular/Lymphatic: Aorta normal caliber with minimal atherosclerotic calcification. No adenopathy. Reproductive: Unremarkable uterus and LEFT ovary. Cyst within RIGHT ovary 2.8 cm diameter; No follow-up imaging recommended. Note: This recommendation does not apply to premenarchal patients and to those with increased risk (genetic, family history, elevated tumor markers or other high-risk factors) of ovarian cancer. Reference: JACR 2020 Feb; 17(2):248-254 Other: No free air or free fluid. Small periumbilical hernia containing fat. No inflammatory process. Musculoskeletal: Degenerative disc disease changes L4-L5 and to lesser degree L5-S1. IMPRESSION: Small periumbilical hernia containing fat. No acute intra-abdominal or intrapelvic abnormalities. Aortic Atherosclerosis (ICD10-I70.0). Electronically Signed   By: Lavonia Dana M.D.   On: 10/23/2021 17:30   DG Chest 2 View  Result Date: 10/23/2021 CLINICAL DATA:  Chest pain and shortness of breath.  Asthma. EXAM: CHEST - 2 VIEW COMPARISON:  10/08/2021 FINDINGS: The heart size and mediastinal contours are within normal limits. Both lungs are clear. The visualized skeletal structures are unremarkable. IMPRESSION: No active cardiopulmonary disease. Electronically Signed   By: Marlaine Hind M.D.   On: 10/23/2021 16:32     Medical Consultants:   None.   Subjective:    April [name] relates her breathing is better dysuria has improved  Objective:    Vitals:   10/24/21 0851 10/24/21 0921 10/24/21 1408 10/25/21 0409  BP:  (!) 146/73 (!) 147/74 (!) 115/50  Pulse:  94 91 86  Resp:  '20 20 20  '$ Temp:  97.7 F (36.5 C) 98.7 F (37.1 C) (!) 97.5 F (36.4 C)  TempSrc:  Oral  Oral  SpO2: 100% 100% 100% 95%  Height:       SpO2: 95 % O2 Flow Rate (L/min): 4 L/min   Intake/Output Summary  (Last 24 hours) at 10/25/2021 0735 Last data filed at 10/24/2021 1300 Gross per 24 hour  Intake 960 ml  Output --  Net 960 ml    There were no vitals filed for this visit.  Exam: General exam: In no acute distress. Respiratory system: Good air movement and clear to auscultation. Cardiovascular system: S1 & S2 heard, RRR. No JVD. Gastrointestinal system: Abdomen is nondistended, soft and nontender.  Extremities: No pedal edema. Skin: No rashes, lesions or ulcers Psychiatry: Judgement and insight appear normal. Mood & affect appropriate.   Data Reviewed:    Labs: Basic Metabolic Panel: Recent Labs  Lab 10/23/21 1632 10/23/21 1705 10/24/21 0757  NA 140 140 141  K 4.0 3.9 4.3  CL 111 109 112*  CO2 21*  --  22  GLUCOSE 116* 118* 160*  BUN '20 19 18  '$ CREATININE 1.20* 1.10* 0.87  CALCIUM 9.0  --  8.7*  MG 2.2  --   --     GFR Estimated Creatinine Clearance: 121 mL/min (by C-G formula based on SCr of 0.87 mg/dL). Liver Function Tests: Recent Labs  Lab 10/23/21 1632  AST 15  ALT 18  ALKPHOS 71  BILITOT 0.3  PROT 6.9  ALBUMIN 3.3*    No results for input(s): "LIPASE", "AMYLASE" in the last 168 hours. No results for input(s): "AMMONIA" in the last 168 hours. Coagulation profile No results for input(s): "INR", "PROTIME" in the last 168 hours. COVID-19 Labs  No results for input(s): "DDIMER", "FERRITIN", "LDH", "CRP" in the last 72 hours.  Lab Results  Component Value Date   SARSCOV2NAA NEGATIVE 10/23/2021   SARSCOV2NAA NEGATIVE 10/08/2021   [place] NEGATIVE 08/26/2021   [place] NEGATIVE 08/23/2021    CBC: Recent Labs  Lab 10/23/21 1632 10/23/21 1705 10/24/21 0757  WBC 19.1*  --  16.3*  HGB 11.5* 12.2 10.7*  HCT 38.0 36.0 34.8*  MCV 81.5  --  82.7  PLT 351  --  307    Cardiac Enzymes: Recent Labs  Lab 10/23/21 1849  CKTOTAL 88    BNP (last 3 results) No results for input(s): "PROBNP" in the last 8760 hours. CBG: Recent Labs  Lab  10/18/21 0753  GLUCAP 248*    D-Dimer: No results for input(s): "DDIMER" in the last 72 hours. Hgb A1c: No results for input(s): "HGBA1C" in the last 72 hours. Lipid Profile: No results for input(s): "CHOL", "HDL", "LDLCALC", "TRIG", "CHOLHDL", "LDLDIRECT" in the last 72 hours. Thyroid function studies: Recent Labs    10/23/21 1836  TSH 2.686    Anemia work up: No results for input(s): "VITAMINB12", "FOLATE", "FERRITIN", "TIBC", "IRON", "RETICCTPCT" in the last 72 hours. Sepsis Labs: Recent Labs  Lab 10/23/21 1632 10/24/21 0757  WBC 19.1* 16.3*    Microbiology Recent Results (from the past 240 hour(s))  Resp Panel by RT-PCR (Flu A&B, Covid) Anterior Nasal Swab     Status: None   Collection Time: 10/23/21  4:33 PM   Specimen: Anterior Nasal Swab  Result Value Ref Range Status   SARS Coronavirus 2 by RT PCR NEGATIVE NEGATIVE Final    Comment: (NOTE) SARS-CoV-2 target nucleic acids are NOT DETECTED.  The SARS-CoV-2 RNA is generally detectable in upper respiratory specimens during the acute phase of infection. The lowest concentration of SARS-CoV-2 viral copies this assay can detect is 138 copies/mL. A negative result does not preclude SARS-Cov-2 infection and should not be used as the sole basis for treatment or other patient management decisions. A negative result may occur with  improper specimen collection/handling, submission of specimen other than nasopharyngeal swab, presence of viral mutation(s) within the areas targeted by this assay, and inadequate number of viral copies(<138 copies/mL). A negative result must be combined with clinical observations, patient history, and epidemiological information. The expected result is Negative.  Fact Sheet for Patients:  EntrepreneurPulse.com.au  Fact Sheet for Healthcare Providers:  IncredibleEmployment.be  This test is no t yet approved or cleared by the [place] FDA and  has  been authorized for detection and/or diagnosis of SARS-CoV-2 by FDA under an Emergency Use Authorization (EUA). This EUA will remain  in effect (meaning this test can be used) for the duration of the COVID-19 declaration under Section 564(b)(1) of the Act, 21 U.S.C.section 360bbb-3(b)(1), unless the authorization is terminated  or revoked sooner.       Influenza A by PCR NEGATIVE NEGATIVE Final   Influenza B by PCR  NEGATIVE NEGATIVE Final    Comment: (NOTE) The Xpert Xpress SARS-CoV-2/FLU/RSV plus assay is intended as an aid in the diagnosis of influenza from Nasopharyngeal swab specimens and should not be used as a sole basis for treatment. Nasal washings and aspirates are unacceptable for Xpert Xpress SARS-CoV-2/FLU/RSV testing.  Fact Sheet for Patients: EntrepreneurPulse.com.au  Fact Sheet for Healthcare Providers: IncredibleEmployment.be  This test is not yet approved or cleared by the [place] FDA and has been authorized for detection and/or diagnosis of SARS-CoV-2 by FDA under an Emergency Use Authorization (EUA). This EUA will remain in effect (meaning this test can be used) for the duration of the COVID-19 declaration under Section 564(b)(1) of the Act, 21 U.S.C. section 360bbb-3(b)(1), unless the authorization is terminated or revoked.  Performed at [facility], [place] [address], [place], [place] [address]   Urine Culture     Status: Abnormal   Collection Time: 10/23/21  6:26 PM   Specimen: Urine, Clean Catch  Result Value Ref Range Status   Specimen Description   Final    URINE, CLEAN CATCH Performed at [facility], [place] [address], [place], [place] [address]    Special Requests   Final    NONE Performed at [facility], [place] [address], [place], [place] [address]    Culture MULTIPLE SPECIES PRESENT, SUGGEST RECOLLECTION (A)  Final   Report Status 10/24/2021 FINAL   Final     Medications:    albuterol  3 mL Inhalation Q6H   amLODipine  10 mg Oral Daily   apixaban  5 mg Oral BID   atorvastatin  20 mg Oral q morning   diltiazem  120 mg Oral Daily   levothyroxine  50 mcg Oral Daily   methylPREDNISolone (SOLU-MEDROL) injection  60 mg Intravenous Q12H   mometasone-formoterol  2 puff Inhalation BID   montelukast  10 mg Oral QHS   zolpidem  10 mg Oral QHS   Continuous Infusions:  cefTRIAXone (ROCEPHIN)  IV 2 g (10/24/21 2238)      LOS: 1 day   Charlynne Cousins  [facility]  10/25/2021, 7:35 AM

## 2021-10-25 NOTE — Telephone Encounter (Signed)
Copied from [place]. Topic: General - Other >> Oct 25, 2021  9:58 AM Cyndi Bender wrote: Reason for CRM: Pt requests that Opal Sidles return call her back at [phone]

## 2021-10-25 NOTE — Progress Notes (Signed)
Pt refused CPAP qhs.  Pt encouraged to contact RT should she change her mind.  

## 2021-10-26 ENCOUNTER — Other Ambulatory Visit (HOSPITAL_COMMUNITY): Payer: Self-pay

## 2021-10-26 DIAGNOSIS — J4551 Severe persistent asthma with (acute) exacerbation: Secondary | ICD-10-CM | POA: Diagnosis not present

## 2021-10-26 DIAGNOSIS — N179 Acute kidney failure, unspecified: Secondary | ICD-10-CM | POA: Diagnosis not present

## 2021-10-26 DIAGNOSIS — I10 Essential (primary) hypertension: Secondary | ICD-10-CM | POA: Diagnosis not present

## 2021-10-26 DIAGNOSIS — J9601 Acute respiratory failure with hypoxia: Secondary | ICD-10-CM | POA: Diagnosis not present

## 2021-10-26 MED ORDER — ARFORMOTEROL TARTRATE 15 MCG/2ML IN NEBU
15.0000 ug | INHALATION_SOLUTION | Freq: Two times a day (BID) | RESPIRATORY_TRACT | 0 refills | Status: DC
  Filled 2021-10-26: qty 120, 30d supply, fill #0

## 2021-10-26 MED ORDER — PREDNISONE 10 MG PO TABS
ORAL_TABLET | ORAL | 0 refills | Status: DC
  Filled 2021-10-26: qty 6, 3d supply, fill #0

## 2021-10-26 MED ORDER — FLUTICASONE-UMECLIDIN-VILANT 100-62.5-25 MCG/ACT IN AEPB
1.0000 | INHALATION_SPRAY | Freq: Every day | RESPIRATORY_TRACT | 5 refills | Status: DC
  Filled 2021-10-26: qty 60, 30d supply, fill #0

## 2021-10-26 MED ORDER — ATORVASTATIN CALCIUM 20 MG PO TABS
20.0000 mg | ORAL_TABLET | Freq: Every morning | ORAL | 3 refills | Status: DC
  Filled 2021-10-26: qty 30, 30d supply, fill #0

## 2021-10-26 NOTE — TOC Transition Note (Signed)
Transition of Care [facility]) - CM/SW Discharge Note   Patient Details  Name: EMMAMARIE KLUENDER MRN: [id number] Date of Birth: 1970-08-04  Transition of Care [facility]) CM/SW Contact:  Leeroy Cha, RN Phone Number: 10/26/2021, 9:44 AM   Clinical Narrative:    Dcd to home with home care in place   Final next level of care: [place] Barriers to Discharge: Continued Medical Work up   Patient Goals and CMS Choice Patient states their goals for this hospitalization and ongoing recovery are:: Home CMS Medicare.gov Compare Post Acute Care list provided to:: Patient Choice offered to / list presented to : Patient  Discharge Placement                       Discharge Plan and Services   Discharge Planning Services: CM Consult Post Acute Care Choice: [employer]: [place] ([facility]) Date [facility] Agency Contacted: 10/26/21 Time [place]: [phone] Representative spoke with at [place]: [place] ([place]) Interventions     Readmission Risk Interventions    10/24/2021   12:29 PM 10/16/2021   11:39 AM 10/09/2021   10:48 AM  Readmission Risk Prevention Plan  Transportation Screening Complete Complete Complete  Medication Review Press photographer) Complete Complete Complete  PCP or Specialist appointment within 3-5 days of discharge Complete Complete Complete  HRI or Home Care Consult Complete Complete Complete  SW Recovery Care/Counseling Consult Complete  Complete  Palliative Care Screening Not Applicable  Not [place] [facility] [place] Not Applicable  Not Applicable

## 2021-10-26 NOTE — TOC Progression Note (Signed)
Transition of Care [facility]) - Progression Note    Patient Details  Name: April [name] MRN: [id number] Date of Birth: 24-Aug-1970  Transition of Care [facility]) CM/SW Contact  Leeroy Cha, RN Phone Number: 10/26/2021, 8:39 AM  Clinical Narrative:    Request for rn and sw for hhc sent to Greggory Brandy with [facility] at [phone]   Expected Discharge Plan: [place] Barriers to Discharge: Continued Medical Work up  Expected Discharge Plan and Services Expected Discharge Plan: [place]   Discharge Planning Services: CM Consult Post Acute Care Choice: [place] arrangements for the past 2 months: [place]: [place] ([place]) Date [facility] Agency Contacted: 10/26/21 Time [place]: [phone] Representative spoke with at [place]: [place] ([place]) Interventions    Readmission Risk Interventions    10/24/2021   12:29 PM 10/16/2021   11:39 AM 10/09/2021   10:48 AM  Readmission Risk Prevention Plan  Transportation Screening Complete Complete Complete  Medication Review (RN Care Manager) Complete Complete Complete  PCP or Specialist appointment within 3-5 days of discharge Complete Complete Complete  HRI or Home Care Consult Complete Complete Complete  SW Recovery Care/Counseling Consult Complete  Complete  Palliative Care Screening Not Applicable  Not [place] Not Applicable  Not Applicable

## 2021-10-26 NOTE — Progress Notes (Signed)
Pt gave a bottle of lyrica with 44 pills and a bottle of clonazepam with 1 pill. The clonazepam bottle originally had more than 1 pill but she refused to let us have the bottle until she removed some of them and only left one in the bottle. Pt still refused to give any of the other bottles of unknown pills. Both bottles were taken to pharmacy and the sheet is in the pt chart with pt phone number and signature.

## 2021-10-26 NOTE — Progress Notes (Signed)
Pt had a fall, Alexie the nurse tech found her sitting on the floor on her bottom.  Pt states she does not know how she fell, she just had a fall. There were 2 personal pill bottles laying on the floor and the pt said it had fallen out of her purse when she fell but she was not taking any of them. Pt states she was wishing that we could pick her up but pt was able to get off the floor with minimal assistance. Pt states she had some leg and buttock pain from the fall, but nothing severe.  On call [place], charge nurse Lorriane Shire, and [facility] [place] notified.  Returned to pt room to retrieve the medications to send down to pharmacy but pt became agitated. Pt has about 6 different pill bottles but refuses to let me nor the charge nurse see them or take them down to pharmacy. Pt states that those other pill bottles were irrelevant and were not hers.   Post fall flow sheet and safety zone complete.

## 2021-10-26 NOTE — Progress Notes (Signed)
The patient is alert, oriented x4, ambulatory without assistance. No change from am assessment. Discharge instructions were reviewed, questions concerns denied. Medications returned from pharmacy.

## 2021-10-26 NOTE — Discharge Summary (Addendum)
Physician Discharge Summary  April [name] UJW:[id number] DOB: 1970-07-02 DOA: 10/23/2021  PCP: [name], MD  Admit date: 10/23/2021 Discharge date: 10/26/2021  Admitted From: Home Disposition:  Home  Recommendations for Outpatient Follow-up:  Follow up with PCP in 1-2 weeks Please obtain BMP/CBC in one week   Home Health:No Equipment/Devices:None  Discharge Condition:Stable CODE STATUS:Full Diet recommendation: Heart Healthy   Brief/Interim Summary: 51 y.o. female past medical history significant for severe persistent asthma, chronic hypoxic respiratory failure, chronic diastolic heart failure paroxysmal atrial fibrillation on Eliquis, essential hypertension obstructive sleep apnea noncompliant with her CPAP, rheumatoid arthritis, morbid obesity with a BMI greater than 50 recently discharged on 10/18/2021 for an asthma exacerbation and possibly an acute decompensated diastolic heart failure was sent home on steroids and Lasix wean from oxygen prior to discharge comes into the ED for shortness of breath and chest tightness, increased work of breathing, white blood cell count of 19 cardiac biomarkers negative x2 EMB of 19.  She relates she has not been able to afford her inhalers due to financial reasons she is only using her albuterol she relate she was cleaning her house with bleach yesterday when she started feeling shortness of breath and started wheezing  Discharge Diagnoses:  Principal Problem:   Acute asthma exacerbation Active Problems:   HTN (hypertension)   AF (paroxysmal atrial fibrillation) ([facility])   Acute respiratory failure ([facility])   Hyperlipidemia   AKI (acute kidney injury) ([place])   Chest pain  Acute respiratory failure with hypoxia secondary to acute asthma exacerbation: She has been noncompliant with his medications due to financial cost, she was exposed to fumes from [employer]. She was requiring 4 L of oxygen to keep her saturations greater 96% she was started on  steroids and inhalers she was weaned to room air. She will continue steroid taper as an outpatient.  Acute kidney injury: Likely prerenal azotemia resolved with IV fluid hydration.  Dysuria: UA and urine cultures were inconclusive she completed 3-day course of antibiotics in house.  Atypical chest pain: Likely due to bronchospasm resolved with inhalers.  Chronic diastolic heart failure: No change made to her medication.  Essential hypertension: Continue current home regimen no changes made.  Sleep apnea: Continue CPAP at night.  Hypothyroidism: Continue Synthroid.  Possible narcotic seeking behavior: Noted.  Discharge Instructions  Discharge Instructions     Diet - low sodium heart healthy   Complete by: As directed    Increase activity slowly   Complete by: As directed       Allergies as of 10/26/2021       Reactions   Contrast Media [iodinated Contrast Media] Itching   CT contrast        Medication List     STOP taking these medications    ipratropium-albuterol 0.5-2.5 (3) MG/3ML Soln Commonly known as: DUONEB   revefenacin 175 MCG/3ML nebulizer solution Commonly known as: YUPELRI       TAKE these medications    albuterol 108 (90 Base) MCG/ACT inhaler Commonly known as: ProAir HFA Inhale 2 puffs into the lungs every 6 (six) hours as needed for wheezing or shortness of breath.   amLODipine 10 MG tablet Commonly known as: NORVASC TAKE 1 TABLET(10 MG) BY MOUTH DAILY   apixaban 5 MG Tabs tablet Commonly known as: ELIQUIS Take 1 tablet (5 mg total) by mouth 2 (two) times daily.   arformoterol 15 MCG/2ML Nebu Commonly known as: BROVANA Take 2 mLs (15 mcg total) by nebulization 2 (two) times  daily.   atorvastatin 20 MG tablet Commonly known as: LIPITOR Take 1 tablet (20 mg total) by mouth every morning.   baclofen 20 MG tablet Commonly known as: LIORESAL Take 20 mg by mouth daily as needed for muscle spasms (pain).   clonazePAM 0.5 MG  tablet Commonly known as: KlonoPIN Take 1 tablet (0.5 mg total) by mouth daily as needed for anxiety.   cloNIDine 0.2 MG tablet Commonly known as: CATAPRES Take 0.2 mg by mouth 2 (two) times daily.   diltiazem 120 MG 24 hr capsule Commonly known as: CARDIZEM CD Take 1 capsule (120 mg total) by mouth daily.   DULoxetine 60 MG capsule Commonly known as: CYMBALTA Take 1 capsule (60 mg total) by mouth daily.   EPINEPHrine 0.3 mg/0.3 mL Soaj injection Commonly known as: EPI-PEN Inject 0.3 mg into the muscle as needed for anaphylaxis.   fluticasone 50 MCG/ACT nasal spray Commonly known as: FLONASE SHAKE LIQUID AND USE 1 SPRAY IN EACH NOSTRIL DAILY AS NEEDED FOR ALLERGIES OR RHINITIS What changed: See the new instructions.   Fluticasone-Umeclidin-Vilant 100-62.5-25 MCG/ACT Aepb Commonly known as: Trelegy Ellipta Inhale 1 puff into the lungs daily. What changed: medication strength   furosemide 40 MG tablet Commonly known as: LASIX Take 1 tablet (40 mg total) by mouth daily.   levothyroxine 50 MCG tablet Commonly known as: SYNTHROID Take 50 mcg by mouth daily.   montelukast 10 MG tablet Commonly known as: SINGULAIR Take 1 tablet (10 mg total) by mouth at bedtime.   naloxone 4 MG/0.1ML Liqd nasal spray kit Commonly known as: NARCAN Place 1 spray into the nose once as needed (opioid overdose).   omeprazole 40 MG capsule Commonly known as: PRILOSEC Take 40 mg by mouth daily.   oxyCODONE-acetaminophen 10-325 MG tablet Commonly known as: PERCOCET Take 1 tablet by mouth 5 (five) times daily as needed (nerve pain).   Ozempic (1 MG/DOSE) 4 MG/3ML Sopn Generic drug: Semaglutide (1 MG/DOSE) Inject 1 mg into the skin every Monday.   phentermine 15 MG capsule Take 15 mg by mouth daily.   potassium chloride SA 20 MEQ tablet Commonly known as: KLOR-CON M TAKE 1 TABLET(20 MEQ) BY MOUTH DAILY What changed: See the new instructions.   predniSONE 10 MG tablet Commonly known  as: DELTASONE Takes 3 tablets for 1 days, then 2 tabs for 1 days, then 1 tab for 1 days, and then stop. What changed: additional instructions   pregabalin 300 MG capsule Commonly known as: LYRICA Take 300 mg by mouth 2 (two) times daily.   Tezspire 210 MG/1. Soaj Generic drug: Tezepelumab-ekko Inject 210 mg into the skin every 28 (twenty-eight) days. Courier to pulm: [address], [address], [place] [address]. Appointment on 11/05/21.   topiramate 200 MG tablet Commonly known as: TOPAMAX Take 200 mg by mouth at bedtime.   zolpidem 10 MG tablet Commonly known as: AMBIEN TAKE 1 TABLET BY MOUTH AT BEDTIME AS NEEDED FOR SLEEP What changed:  how much to take how to take this when to take this additional instructions        Follow-up Information     [place], [facility] Follow up.   Why: [facility] nursing/social worker Contact information: [address] [place] [address] [phone]                Allergies  Allergen Reactions   Contrast Media [Iodinated Contrast Media] Itching    CT contrast    Consultations: None  Procedures/Studies: CT ABDOMEN PELVIS WO CONTRAST  Result Date: 10/23/2021 CLINICAL DATA:  Flank pain laterality not specified, chest pain and shortness of breath since being discharged last Thursday for asthma EXAM: CT ABDOMEN AND PELVIS WITHOUT CONTRAST TECHNIQUE: Multidetector CT imaging of the abdomen and pelvis was performed following the standard protocol without IV contrast. RADIATION DOSE REDUCTION: This exam was performed according to the departmental dose-optimization program which includes automated exposure control, adjustment of the mA and/or kV according to patient size and/or use of iterative reconstruction technique. COMPARISON:  03/29/2017 FINDINGS: Lower chest: Minimal atelectasis at lung bases. No pulmonary infiltrate or pleural effusion. Hepatobiliary: Gallbladder and liver unremarkable. Pancreas:  Normal appearance Spleen: Normal appearance Adrenals/Urinary Tract: Adrenal glands and kidneys normal appearance. No renal mass, hydronephrosis, hydroureter, or ureteral calcification. Bladder unremarkable. Stomach/Bowel: Normal appendix. Scattered stool in colon. Stomach and bowel loops otherwise unremarkable. Vascular/Lymphatic: Aorta normal caliber with minimal atherosclerotic calcification. No adenopathy. Reproductive: Unremarkable uterus and LEFT ovary. Cyst within RIGHT ovary 2.8 cm diameter; No follow-up imaging recommended. Note: This recommendation does not apply to premenarchal patients and to those with increased risk (genetic, family history, elevated tumor markers or other high-risk factors) of ovarian cancer. Reference: JACR 2020 Feb; 17(2):248-254 Other: No free air or free fluid. Small periumbilical hernia containing fat. No inflammatory process. Musculoskeletal: Degenerative disc disease changes L4-L5 and to lesser degree L5-S1. IMPRESSION: Small periumbilical hernia containing fat. No acute intra-abdominal or intrapelvic abnormalities. Aortic Atherosclerosis (ICD10-I70.0). Electronically Signed   By: Lavonia Dana M.D.   On: 10/23/2021 17:30   DG Chest 2 View  Result Date: 10/23/2021 CLINICAL DATA:  Chest pain and shortness of breath.  Asthma. EXAM: CHEST - 2 VIEW COMPARISON:  10/08/2021 FINDINGS: The heart size and mediastinal contours are within normal limits. Both lungs are clear. The visualized skeletal structures are unremarkable. IMPRESSION: No active cardiopulmonary disease. Electronically Signed   By: Marlaine Hind M.D.   On: 10/23/2021 16:32   ECHOCARDIOGRAM COMPLETE  Result Date: 10/15/2021    ECHOCARDIOGRAM REPORT   Patient Name:   JERALYN NOLDEN Date of Exam: 10/15/2021 Medical Rec #:  [id number]    Height:       66.0 in Accession #:    [id number]   Weight:       343.0 lb Date of Birth:  10-16-1970     BSA:          2.515 m Patient Age:    45 years     BP:           145/69 mmHg Patient  Gender: F            HR:           78 bpm. Exam Location:  Inpatient Procedure: 2D Echo, Cardiac Doppler and Color Doppler Indications:    CHF  History:        Patient has prior history of Echocardiogram examinations, most                 recent 05/25/2021.  Sonographer:    Joette Catching RCS Referring Phys: [place]  1. Left ventricular ejection fraction, by estimation, is 60 to 65%. The left ventricle has normal function. The left ventricle has no regional wall motion abnormalities. There is moderate left ventricular hypertrophy. Left ventricular diastolic parameters are consistent with Grade I diastolic dysfunction (impaired relaxation).  2. Right ventricular systolic function is normal. The right ventricular size is normal.  3. The mitral valve is normal  in structure. Trivial mitral valve regurgitation. No evidence of mitral stenosis.  4. The aortic valve is normal in structure. Aortic valve regurgitation is not visualized. No aortic stenosis is present.  5. The inferior vena cava is normal in size with greater than 50% respiratory variability, suggesting right atrial pressure of 3 mmHg. FINDINGS  Left Ventricle: Left ventricular ejection fraction, by estimation, is 60 to 65%. The left ventricle has normal function. The left ventricle has no regional wall motion abnormalities. The left ventricular internal cavity size was normal in size. There is  moderate left ventricular hypertrophy. Left ventricular diastolic parameters are consistent with Grade I diastolic dysfunction (impaired relaxation). Right Ventricle: The right ventricular size is normal. No increase in right ventricular wall thickness. Right ventricular systolic function is normal. Left Atrium: Left atrial size was normal in size. Right Atrium: Right atrial size was normal in size. Pericardium: There is no evidence of pericardial effusion. Mitral Valve: The mitral valve is normal in structure. Trivial mitral valve  regurgitation. No evidence of mitral valve stenosis. Tricuspid Valve: The tricuspid valve is normal in structure. Tricuspid valve regurgitation is not demonstrated. No evidence of tricuspid stenosis. Aortic Valve: The aortic valve is normal in structure. Aortic valve regurgitation is not visualized. No aortic stenosis is present. Aortic valve mean gradient measures 6.0 mmHg. Aortic valve peak gradient measures 11.7 mmHg. Aortic valve area, by VTI measures 3.19 cm. Pulmonic Valve: The pulmonic valve was normal in structure. Pulmonic valve regurgitation is not visualized. No evidence of pulmonic stenosis. Aorta: The aortic root is normal in size and structure. Venous: The inferior vena cava is normal in size with greater than 50% respiratory variability, suggesting right atrial pressure of 3 mmHg. IAS/Shunts: No atrial level shunt detected by color flow Doppler.  LEFT VENTRICLE PLAX 2D LVIDd:         5.30 cm   Diastology LVIDs:         3.60 cm   LV e' medial:    8.92 cm/s LV PW:         1.40 cm   LV E/e' medial:  12.2 LV IVS:        1.50 cm   LV e' lateral:   8.38 cm/s LVOT diam:     2.30 cm   LV E/e' lateral: 13.0 LV SV:         103 LV SV Index:   41 LVOT Area:     4.15 cm  RIGHT VENTRICLE             IVC RV Basal diam:  3.50 cm     IVC diam: 2.00 cm RV Mid diam:    2.60 cm RV S prime:     17.70 cm/s LEFT ATRIUM             Index        RIGHT ATRIUM           Index LA diam:        3.80 cm 1.51 cm/m   RA Area:     12.60 cm LA Vol (A2C):   43.3 ml 17.22 ml/m  RA Volume:   28.60 ml  11.37 ml/m LA Vol (A4C):   41.9 ml 16.66 ml/m LA Biplane Vol: 44.8 ml 17.81 ml/m  AORTIC VALVE                     PULMONIC VALVE AV Area (Vmax):    3.23 cm  PV Vmax:          1.57 m/s AV Area (Vmean):   3.21 cm      PV Peak grad:     9.9 mmHg AV Area (VTI):     3.19 cm      PR End Diast Vel: 3.41 msec AV Vmax:           171.00 cm/s AV Vmean:          114.000 cm/s AV VTI:            0.324 m AV Peak Grad:      11.7 mmHg AV Mean  Grad:      6.0 mmHg LVOT Vmax:         133.00 cm/s LVOT Vmean:        88.100 cm/s LVOT VTI:          0.249 m LVOT/AV VTI ratio: 0.77  AORTA Ao Root diam: 3.00 cm Ao Asc diam:  2.90 cm MITRAL VALVE                TRICUSPID VALVE MV Area (PHT): 3.60 cm     TR Peak grad:   16.2 mmHg MV Decel Time: 211 msec     TR Vmax:        201.00 cm/s MV E velocity: 109.00 cm/s MV A velocity: 107.00 cm/s  SHUNTS MV E/A ratio:  1.02         Systemic VTI:  0.25 m                             Systemic Diam: 2.30 cm Glori Bickers MD Electronically signed by Glori Bickers MD Signature Date/Time: 10/15/2021/4:13:50 PM    Final    CT Chest High Resolution  Result Date: 10/14/2021 CLINICAL DATA:  Shortness of breath EXAM: CT CHEST WITHOUT CONTRAST TECHNIQUE: Multidetector CT imaging of the chest was performed following the standard protocol without intravenous contrast. High resolution imaging of the lungs, as well as inspiratory and expiratory imaging, was performed. RADIATION DOSE REDUCTION: This exam was performed according to the departmental dose-optimization program which includes automated exposure control, adjustment of the mA and/or kV according to patient size and/or use of iterative reconstruction technique. COMPARISON:  Multiple priors, most recent chest CT dated June 25th 2023; chest CT dated November 07, 2017; chest CT dated January 27, 2017 FINDINGS: Cardiovascular: Mild cardiomegaly. No pericardial effusion. Moderate coronary artery calcifications of the LAD. No significant atherosclerotic disease of the thoracic aorta. Mediastinum/Nodes: Thyroid and esophagus are unremarkable. No pathologically enlarged lymph nodes seen in the chest. Lungs/Pleura: Central airways are patent. Expiratory phase images on today's exam are inadequate since obtained during inspiration, however on prior CTs with expiratory phase imaging there is no evidence of air trapping. Mild centrilobular and paraseptal emphysema. Mild upper and anterior  lung predominant subpleural and peribronchovascular reticular opacities with no evidence of traction bronchiectasis or honeycomb change. Findings are new when compared with January 27, 2017 exam, but improved when compared with November 07, 2017 exam. Mild focal bronchiectasis in the lingula with associated linear opacities, unchanged when compared with most recent prior exam and likely sequela of prior infection or aspiration. No consolidation, pleural effusion or pneumothorax. Upper Abdomen: No acute abnormality. Musculoskeletal: No chest wall mass or suspicious bone lesions identified. IMPRESSION: 1. No acute airspace opacity. 2. Mild upper and anterior lung predominant subpleural and peribronchovascular reticular opacities with no traction bronchiectasis or evidence  of air trapping, findings are new when compared with January 27, 2017 exam, but improved when compared with November 07, 2017 exam. Findings are likely due to scarring related to prior acute lung injury. 3. Moderate coronary artery calcifications of the LAD. 4. Mild emphysema (ICD10-J43.9). Electronically Signed   By: Yetta Glassman M.D.   On: 10/14/2021 20:05   DG Chest 2 View  Result Date: 10/08/2021 CLINICAL DATA:  Shortness of breath, chest pain EXAM: CHEST - 2 VIEW COMPARISON:  08/26/2021 FINDINGS: The heart size and mediastinal contours are within normal limits. Both lungs are clear. The visualized skeletal structures are unremarkable. IMPRESSION: No active cardiopulmonary disease. Electronically Signed   By: Rolm Baptise M.D.   On: 10/08/2021 19:14   (Echo, Carotid, EGD, Colonoscopy, ERCP)    Subjective: No complaints  Discharge Exam: Vitals:   10/26/21 0849 10/26/21 0853  BP:    Pulse:    Resp:    Temp:    SpO2: 100% 100%   Vitals:   10/25/21 2031 10/26/21 0300 10/26/21 0849 10/26/21 0853  BP: 125/66 (!) 157/91    Pulse: 97 (!) 107    Resp: 20 20    Temp: 98.6 F (37 C) 98.1 F (36.7 C)    TempSrc: Oral Oral    SpO2: 100%  100% 100% 100%  Height:        General: Pt is alert, awake, not in acute distress Cardiovascular: RRR, S1/S2 +, no rubs, no gallops Respiratory: CTA bilaterally, no wheezing, no rhonchi Abdominal: Soft, NT, ND, bowel sounds + Extremities: no edema, no cyanosis    The results of significant diagnostics from this hospitalization (including imaging, microbiology, ancillary and laboratory) are listed below for reference.     Microbiology: Recent Results (from the past 240 hour(s))  Resp Panel by RT-PCR (Flu A&B, Covid) Anterior Nasal Swab     Status: None   Collection Time: 10/23/21  4:33 PM   Specimen: Anterior Nasal Swab  Result Value Ref Range Status   SARS Coronavirus 2 by RT PCR NEGATIVE NEGATIVE Final    Comment: (NOTE) SARS-CoV-2 target nucleic acids are NOT DETECTED.  The SARS-CoV-2 RNA is generally detectable in upper respiratory specimens during the acute phase of infection. The lowest concentration of SARS-CoV-2 viral copies this assay can detect is 138 copies/mL. A negative result does not preclude SARS-Cov-2 infection and should not be used as the sole basis for treatment or other patient management decisions. A negative result may occur with  improper specimen collection/handling, submission of specimen other than nasopharyngeal swab, presence of viral mutation(s) within the areas targeted by this assay, and inadequate number of viral copies(<138 copies/mL). A negative result must be combined with clinical observations, patient history, and epidemiological information. The expected result is Negative.  Fact Sheet for Patients:  EntrepreneurPulse.com.au  Fact Sheet for Healthcare Providers:  IncredibleEmployment.be  This test is no t yet approved or cleared by the [place] FDA and  has been authorized for detection and/or diagnosis of SARS-CoV-2 by FDA under an Emergency Use Authorization (EUA). This EUA will remain  in  effect (meaning this test can be used) for the duration of the COVID-19 declaration under Section 564(b)(1) of the Act, 21 U.S.C.section 360bbb-3(b)(1), unless the authorization is terminated  or revoked sooner.       Influenza A by PCR NEGATIVE NEGATIVE Final   Influenza B by PCR NEGATIVE NEGATIVE Final    Comment: (NOTE) The Xpert Xpress SARS-CoV-2/FLU/RSV plus assay is intended as an  aid in the diagnosis of influenza from Nasopharyngeal swab specimens and should not be used as a sole basis for treatment. Nasal washings and aspirates are unacceptable for Xpert Xpress SARS-CoV-2/FLU/RSV testing.  Fact Sheet for Patients: EntrepreneurPulse.com.au  Fact Sheet for Healthcare Providers: IncredibleEmployment.be  This test is not yet approved or cleared by the [place] FDA and has been authorized for detection and/or diagnosis of SARS-CoV-2 by FDA under an Emergency Use Authorization (EUA). This EUA will remain in effect (meaning this test can be used) for the duration of the COVID-19 declaration under Section 564(b)(1) of the Act, 21 U.S.C. section 360bbb-3(b)(1), unless the authorization is terminated or revoked.  Performed at [facility], [place] [address], [place], [place] [address]   Urine Culture     Status: Abnormal   Collection Time: 10/23/21  6:26 PM   Specimen: Urine, Clean Catch  Result Value Ref Range Status   Specimen Description   Final    URINE, CLEAN CATCH Performed at [facility], [place] [address] [place], [place] [address]    Special Requests   Final    NONE Performed at [facility], [place] [address], [place], [place] [address]    Culture MULTIPLE SPECIES PRESENT, SUGGEST RECOLLECTION (A)  Final   Report Status 10/24/2021 FINAL  Final     Labs: BNP (last 3 results) Recent Labs    10/08/21 1802 10/13/21 1253 10/23/21 1632  BNP 36.1 66.3 89.2   Basic  Metabolic Panel: Recent Labs  Lab 10/23/21 1632 10/23/21 1705 10/24/21 0757  NA 140 140 141  K 4.0 3.9 4.3  CL 111 109 112*  CO2 21*  --  22  GLUCOSE 116* 118* 160*  BUN $Re'20 19 18  'ApS$ CREATININE 1.20* 1.10* 0.87  CALCIUM 9.0  --  8.7*  MG 2.2  --   --    Liver Function Tests: Recent Labs  Lab 10/23/21 1632  AST 15  ALT 18  ALKPHOS 71  BILITOT 0.3  PROT 6.9  ALBUMIN 3.3*   No results for input(s): "LIPASE", "AMYLASE" in the last 168 hours. No results for input(s): "AMMONIA" in the last 168 hours. CBC: Recent Labs  Lab 10/23/21 1632 10/23/21 1705 10/24/21 0757  WBC 19.1*  --  16.3*  HGB 11.5* 12.2 10.7*  HCT 38.0 36.0 34.8*  MCV 81.5  --  82.7  PLT 351  --  307   Cardiac Enzymes: Recent Labs  Lab 10/23/21 1849  CKTOTAL 88   BNP: Invalid input(s): "POCBNP" CBG: No results for input(s): "GLUCAP" in the last 168 hours. D-Dimer No results for input(s): "DDIMER" in the last 72 hours. Hgb A1c No results for input(s): "HGBA1C" in the last 72 hours. Lipid Profile No results for input(s): "CHOL", "HDL", "LDLCALC", "TRIG", "CHOLHDL", "LDLDIRECT" in the last 72 hours. Thyroid function studies Recent Labs    10/23/21 1836  TSH 2.686   Anemia work up No results for input(s): "VITAMINB12", "FOLATE", "FERRITIN", "TIBC", "IRON", "RETICCTPCT" in the last 72 hours. Urinalysis    Component Value Date/Time   COLORURINE YELLOW 10/23/2021 1826   APPEARANCEUR CLOUDY (A) 10/23/2021 1826   LABSPEC 1.027 10/23/2021 1826   PHURINE 5.0 10/23/2021 1826   GLUCOSEU NEGATIVE 10/23/2021 1826   GLUCOSEU NEG mg/dL 10/28/2007 2049   HGBUR NEGATIVE 10/23/2021 1826   BILIRUBINUR NEGATIVE 10/23/2021 1826   KETONESUR NEGATIVE 10/23/2021 1826   PROTEINUR NEGATIVE 10/23/2021 1826   UROBILINOGEN 1.0 11/21/2014 0707   NITRITE NEGATIVE 10/23/2021 1826  LEUKOCYTESUR LARGE (A) 10/23/2021 1826   Sepsis Labs Recent Labs  Lab 10/23/21 1632 10/24/21 0757  WBC 19.1* 16.3*    Microbiology Recent Results (from the past 240 hour(s))  Resp Panel by RT-PCR (Flu A&B, Covid) Anterior Nasal Swab     Status: None   Collection Time: 10/23/21  4:33 PM   Specimen: Anterior Nasal Swab  Result Value Ref Range Status   SARS Coronavirus 2 by RT PCR NEGATIVE NEGATIVE Final    Comment: (NOTE) SARS-CoV-2 target nucleic acids are NOT DETECTED.  The SARS-CoV-2 RNA is generally detectable in upper respiratory specimens during the acute phase of infection. The lowest concentration of SARS-CoV-2 viral copies this assay can detect is 138 copies/mL. A negative result does not preclude SARS-Cov-2 infection and should not be used as the sole basis for treatment or other patient management decisions. A negative result may occur with  improper specimen collection/handling, submission of specimen other than nasopharyngeal swab, presence of viral mutation(s) within the areas targeted by this assay, and inadequate number of viral copies(<138 copies/mL). A negative result must be combined with clinical observations, patient history, and epidemiological information. The expected result is Negative.  Fact Sheet for Patients:  EntrepreneurPulse.com.au  Fact Sheet for Healthcare Providers:  IncredibleEmployment.be  This test is no t yet approved or cleared by the [place] FDA and  has been authorized for detection and/or diagnosis of SARS-CoV-2 by FDA under an Emergency Use Authorization (EUA). This EUA will remain  in effect (meaning this test can be used) for the duration of the COVID-19 declaration under Section 564(b)(1) of the Act, 21 U.S.C.section 360bbb-3(b)(1), unless the authorization is terminated  or revoked sooner.       Influenza A by PCR NEGATIVE NEGATIVE Final   Influenza B by PCR NEGATIVE NEGATIVE Final    Comment: (NOTE) The Xpert Xpress SARS-CoV-2/FLU/RSV plus assay is intended as an aid in the diagnosis of influenza  from Nasopharyngeal swab specimens and should not be used as a sole basis for treatment. Nasal washings and aspirates are unacceptable for Xpert Xpress SARS-CoV-2/FLU/RSV testing.  Fact Sheet for Patients: EntrepreneurPulse.com.au  Fact Sheet for Healthcare Providers: IncredibleEmployment.be  This test is not yet approved or cleared by the [place] FDA and has been authorized for detection and/or diagnosis of SARS-CoV-2 by FDA under an Emergency Use Authorization (EUA). This EUA will remain in effect (meaning this test can be used) for the duration of the COVID-19 declaration under Section 564(b)(1) of the Act, 21 U.S.C. section 360bbb-3(b)(1), unless the authorization is terminated or revoked.  Performed at [facility], [place] [address] [place], [place] [address]   Urine Culture     Status: Abnormal   Collection Time: 10/23/21  6:26 PM   Specimen: Urine, Clean Catch  Result Value Ref Range Status   Specimen Description   Final    URINE, CLEAN CATCH Performed at [facility], [place] [address] [place], [place] [address]    Special Requests   Final    NONE Performed at [facility], [place] [address] [place], [place] [address]    Culture MULTIPLE SPECIES PRESENT, SUGGEST RECOLLECTION (A)  Final   Report Status 10/24/2021 FINAL  Final     SIGNED:   Charlynne Cousins, MD  [facility] 10/26/2021, 8:54 AM Pager   If 7PM-7AM, please contact night-coverage [email or web address] Password [email or web address]

## 2021-10-27 ENCOUNTER — Other Ambulatory Visit (HOSPITAL_COMMUNITY): Payer: Self-pay

## 2021-10-29 ENCOUNTER — Telehealth: Payer: Self-pay | Admitting: Internal Medicine

## 2021-10-29 ENCOUNTER — Other Ambulatory Visit (HOSPITAL_COMMUNITY): Payer: Self-pay

## 2021-10-29 ENCOUNTER — Telehealth: Payer: Self-pay

## 2021-10-29 ENCOUNTER — Other Ambulatory Visit: Payer: Self-pay | Admitting: Internal Medicine

## 2021-10-29 NOTE — Telephone Encounter (Signed)
Called and spoke with patient. Patient stated that she needed a refill on her clonazepam.   CY, please advise.

## 2021-10-29 NOTE — Telephone Encounter (Signed)
Dr. Annamaria Boots, please advise on this. Looks like an Rx for 10tabs was sent to pharmacy for pt 10/18/21 by Dr. Florencia Reasons.    Allergies  Allergen Reactions   Contrast Media [Iodinated Contrast Media] Itching    CT contrast     Current Outpatient Medications:    albuterol (PROAIR HFA) 108 (90 Base) MCG/ACT inhaler, Inhale 2 puffs into the lungs every 6 (six) hours as needed for wheezing or shortness of breath., Disp: 8.5 g, Rfl: 11   amLODipine (NORVASC) 10 MG tablet, TAKE 1 TABLET(10 MG) BY MOUTH DAILY, Disp: 90 tablet, Rfl: 1   apixaban (ELIQUIS) 5 MG TABS tablet, Take 1 tablet (5 mg total) by mouth 2 (two) times daily., Disp: 180 tablet, Rfl: 1   arformoterol (BROVANA) 15 MCG/2ML NEBU, Inhale 2 mLs (15 mcg total) by nebulization 2 (two) times daily., Disp: 120 mL, Rfl: 0   atorvastatin (LIPITOR) 20 MG tablet, Take 1 tablet by mouth every morning., Disp: 30 tablet, Rfl: 3   baclofen (LIORESAL) 20 MG tablet, Take 20 mg by mouth daily as needed for muscle spasms (pain)., Disp: , Rfl:    clonazePAM (KLONOPIN) 0.5 MG tablet, Take 1 tablet (0.5 mg total) by mouth daily as needed for anxiety., Disp: 10 tablet, Rfl: 0   cloNIDine (CATAPRES) 0.2 MG tablet, Take 0.2 mg by mouth 2 (two) times daily., Disp: , Rfl:    diltiazem (CARDIZEM CD) 120 MG 24 hr capsule, Take 1 capsule (120 mg total) by mouth daily. (Patient not taking: Reported on 10/24/2021), Disp: 30 capsule, Rfl: 0   DULoxetine (CYMBALTA) 60 MG capsule, Take 1 capsule (60 mg total) by mouth daily., Disp: 90 capsule, Rfl: 1   EPINEPHrine 0.3 mg/0.3 mL IJ SOAJ injection, Inject 0.3 mg into the muscle as needed for anaphylaxis., Disp: 1 each, Rfl: 5   fluticasone (FLONASE) 50 MCG/ACT nasal spray, SHAKE LIQUID AND USE 1 SPRAY IN EACH NOSTRIL DAILY AS NEEDED FOR ALLERGIES OR RHINITIS (Patient taking differently: Place 1 spray into both nostrils daily.), Disp: 16 g, Rfl: 0   Fluticasone-Umeclidin-Vilant 100-62.5-25 MCG/ACT AEPB, Inhale 1 puff into the  lungs daily., Disp: 60 each, Rfl: 5   furosemide (LASIX) 40 MG tablet, Take 1 tablet (40 mg total) by mouth daily., Disp: 30 tablet, Rfl: 6   levothyroxine (SYNTHROID) 50 MCG tablet, Take 50 mcg by mouth daily. (Patient not taking: Reported on 10/24/2021), Disp: , Rfl:    montelukast (SINGULAIR) 10 MG tablet, Take 1 tablet (10 mg total) by mouth at bedtime., Disp: 90 tablet, Rfl: 0   naloxone (NARCAN) nasal spray 4 mg/0.1 mL, Place 1 spray into the nose once as needed (opioid overdose)., Disp: , Rfl:    omeprazole (PRILOSEC) 40 MG capsule, Take 40 mg by mouth daily., Disp: , Rfl:    oxyCODONE-acetaminophen (PERCOCET) 10-325 MG tablet, Take 1 tablet by mouth 5 (five) times daily as needed (nerve pain)., Disp: , Rfl:    phentermine 15 MG capsule, Take 15 mg by mouth daily., Disp: , Rfl:    potassium chloride SA (KLOR-CON M) 20 MEQ tablet, TAKE 1 TABLET(20 MEQ) BY MOUTH DAILY, Disp: 90 tablet, Rfl: 0   predniSONE (DELTASONE) 10 MG tablet, Takes 3 tablets by mouth for 1 day, then take 2 tablets for 1 day, then take 1 tablet for 1 day, Disp: 6 tablet, Rfl: 0   pregabalin (LYRICA) 300 MG capsule, Take 300 mg by mouth 2 (two) times daily., Disp: , Rfl:    Semaglutide, 1 MG/DOSE, (  OZEMPIC, 1 MG/DOSE,) 4 MG/3ML SOPN, Inject 1 mg into the skin every Monday., Disp: , Rfl:    Tezepelumab-ekko (TEZSPIRE) 210 MG/1.91ML SOAJ, Inject 210 mg into the skin every 28 (twenty-eight) days. Courier to pulm: [address] [place] [address], [place] [address]. Appointment on 11/05/21., Disp: 1.91 mL, Rfl: 0   topiramate (TOPAMAX) 200 MG tablet, Take 200 mg by mouth at bedtime., Disp: , Rfl:    zolpidem (AMBIEN) 10 MG tablet, TAKE 1 TABLET BY MOUTH AT BEDTIME AS NEEDED FOR SLEEP (Patient taking differently: Take 10 mg by mouth at bedtime.), Disp: 30 tablet, Rfl: 5

## 2021-10-29 NOTE — Telephone Encounter (Signed)
Transition Care Management Unsuccessful Follow-up Telephone Call  Date of discharge and from where:  [facility] 10/24/21-10/26/21  Attempts:  1st Attempt  Reason for unsuccessful TCM follow-up call:  Spoke with patient and she was unavailable to talk.  Request call back in 30 minutes.  Johnney Killian, RN, BSN, CCM Care Management Coordinator [place]/[facility] Phone: [phone]: [phone]

## 2021-10-29 NOTE — Telephone Encounter (Signed)
From the discharge call:   She said she is feeling a little better. She continues to have pain in her back and legs. She had a fall in the hospital.  she said that the lasix is too strong so she is not taking it. She explained that she was incontinent of urine in the bed and believes it was due to the lasix.   She would like to have PCS again.  A new referral can  be placed after she sees Dr Margarita Rana next week   She has home O2 that she only uses as needed.   Scheduled to see Dr Margarita Rana - 11/08/2021,

## 2021-10-29 NOTE — Telephone Encounter (Signed)
Transition Care Management Follow-up Telephone Call Date of discharge and from where: 10/26/2021, [facility] How have you been since you were released from the hospital? She said she is feeling a little better. She continues to have pain in her back and legs. She had a fall in the hospital. Any questions or concerns? Yes- she said that the lasix is too strong so she is not taking it. She explained that she was incontinent of urine in the bed and believes it was due to the lasix.  She would like to have PCS again.  A new referral can  be placed after she sees Dr Margarita Rana next week   Items Reviewed: Did the pt receive and understand the discharge instructions provided? Yes  Medications obtained and verified? Yes - she said she has all of her medications. Concern about lasix noted above  Other? No  Any new allergies since your discharge? No  Dietary orders reviewed? No Do you have support at home?  She lives alone and her sister comes to check on her.   Home Care and Equipment/Supplies: Were home health services ordered? yes If so, what is the name of the agency? [facility]  Has the agency set up a time to come to the patient's home? Yes - visit scheduled for 11/02/2021,  Were any new equipment or medical supplies ordered?  No What is the name of the medical supply agency? N/a Were you able to get the supplies/equipment? not applicable Do you have any questions related to the use of the equipment or supplies? No  Functional Questionnaire: (I = Independent and D = Dependent) ADLs: independent  She has home O2 that she only uses as needed.   Follow up appointments reviewed:  PCP Hospital f/u appt confirmed? Yes  Scheduled to see Dr Margarita Rana - 11/08/2021,   [facility] f/u appt confirmed? Yes  Scheduled to see pulmonary - 11/13/2021  Are transportation arrangements needed? No  If their condition worsens, is the pt aware to call PCP or go to the Emergency Dept.? Yes Was the patient  provided with contact information for the PCP's office or ED? Yes Was to pt encouraged to call back with questions or concerns? Yes

## 2021-10-29 NOTE — Telephone Encounter (Signed)
Transition Care Management Unsuccessful Follow-up Telephone Call  Date of discharge and from where:  [facility] 10/24/21-10/26/21  Attempts:  2nd Attempt  Reason for unsuccessful TCM follow-up call:  Unable to leave message- Called patient back as she requested from 1st call.  Went straight to [employer].  Johnney Killian, RN, BSN, CCM Care Management Coordinator [place]/[facility] Phone: [phone]: [phone]

## 2021-10-31 ENCOUNTER — Ambulatory Visit: Payer: Medicare Other

## 2021-10-31 ENCOUNTER — Ambulatory Visit: Payer: Medicare Other | Attending: Family Medicine

## 2021-10-31 DIAGNOSIS — R5381 Other malaise: Secondary | ICD-10-CM | POA: Insufficient documentation

## 2021-10-31 DIAGNOSIS — M6281 Muscle weakness (generalized): Secondary | ICD-10-CM | POA: Insufficient documentation

## 2021-10-31 NOTE — Therapy (Signed)
OUTPATIENT PHYSICAL THERAPY NEURO EVALUATION   Patient Name: April [name] MRN: [id number] DOB:[birth year], 51 y.o., female Today's Date: 10/31/2021   PCP: [name], MD REFERRING PROVIDER: [name], MD   PT End of Session - 10/31/21 1305     Visit Number 1    Number of Visits 4    Date for PT Re-Evaluation 11/28/21    Authorization Type UHC MCR    PT Start Time 2706    PT Stop Time 1345    PT Time Calculation (min) 40 min    Activity Tolerance Patient tolerated treatment well    Behavior During Therapy WFL for tasks assessed/performed             Past Medical History:  Diagnosis Date   Acanthosis nigricans    Anxiety    Arthritis    "knees" (04/28/2017)   Asthma    Followed by Dr. Annamaria Boots (pulmonology); receives every other week omalizumab injections; has frequent exacerbations   Back pain    Chronic diastolic CHF (congestive heart failure) ([place]) 01/17/2017   COPD (chronic obstructive pulmonary disease) ([place])    PFTs in 2002, FEV1/FVC 65, no post bronchodilater test done   Depression    GERD (gastroesophageal reflux disease)    Headache(784.0)    "q couple days" (23/76/2831)   Helicobacter pylori (H. pylori) infection    Hypertension, essential    Insomnia    Joint pain    Lower extremity edema    Menorrhagia    Morbid obesity ([place])    OSA on CPAP    Sleep study 2008 - mild OSA, not enough events to titrate CPAP; wears CPAP now/pt on 04/28/2017   Pneumonia X 1   Prediabetes    Rheumatoid arthritis (HCC)    Seasonal allergies    Shortness of breath    Tobacco user    Vitamin D deficiency    Past Surgical History:  Procedure Laterality Date   CARDIOVERSION N/A 05/30/2017   Procedure: CARDIOVERSION;  Surgeon: Sanda Klein, MD;  Location: [facility];  Service: Cardiovascular;  Laterality: N/A;   REDUCTION MAMMAPLASTY Bilateral 09/2011   TUBAL LIGATION  1996   bilateral   Patient Active Problem List   Diagnosis Date Noted   Severe  persistent asthma with acute exacerbation in adult 08/23/2021   Hypothyroidism 08/23/2021   Chronic heart failure with preserved ejection fraction (HFpEF) ([place]) 08/23/2021   Primary osteoarthritis of right knee 07/04/2021   Body mass index 50.0-59.9, adult ([place]) 07/04/2021   Painful orthopaedic hardware ([place]) 07/04/2021   COPD with exacerbation ([place]) 02/04/2021   Severe persistent asthma 07/04/2020   Acute respiratory failure ([place]) 02/20/2019   CHF exacerbation ([place]) 02/20/2019   Hot flashes    Asthma-COPD overlap syndrome ([place]) 10/26/2018   Acute respiratory distress 08/03/2018   Acute exacerbation of chronic obstructive pulmonary disease (COPD) ([place]) 08/03/2018   Vitamin D deficiency 05/26/2018   Acute asthma exacerbation 05/16/2018   Urinary incontinence 11/17/2017   Nausea with vomiting 11/06/2017   Hypomagnesemia 10/27/2017   Dysphagia 10/27/2017   Physical deconditioning 10/25/2017   Acute maxillary sinusitis 10/24/2017   Emesis, persistent 10/08/2017   Volume depletion 10/08/2017   Chest pain 10/08/2017   SOB (shortness of breath) 10/08/2017   Debility 09/22/2017   AKI (acute kidney injury) ([place]) 08/18/2017   HCAP (healthcare-associated pneumonia) 08/06/2017   Chronic atrial fibrillation 07/29/2017   Acute respiratory failure with hypoxia ([place]) 07/07/2017   AF (paroxysmal atrial fibrillation) ([place])    Dyspnea 02/04/2017  Asthma exacerbation 11/94/1740   Metabolic syndrome 81/44/8185   Moderate persistent asthma with exacerbation 10/19/2016   Normocytic anemia 10/02/2016   Elevated hemoglobin A1c 05/05/2016   GERD (gastroesophageal reflux disease) 08/30/2015   Anxiety and depression 08/30/2015   Generalized anxiety disorder 08/30/2015   Hyperlipidemia 12/08/2013   Seasonal allergic rhinitis 08/29/2013   Leukocytosis 10/21/2012   Tobacco abuse in remission 10/07/2012   COPD mixed type ([place]) 05/07/2012   Knee pain, bilateral 04/25/2011   Insomnia 03/14/2011    Obstructive sleep apnea 12/19/2010   Hypokalemia 08/13/2010   Cervical back pain with evidence of disc disease 04/08/2008   HTN (hypertension) 07/31/2006   Morbid obesity due to excess calories ([place]) 06/17/2006   Major depressive disorder, recurrent episode ([place]) 04/10/2006    ONSET DATE: 10/23/21  REFERRING DIAG: R53.81 (ICD-10-CM) - Physical deconditioning   THERAPY DIAG: physical deconditioning   Rationale for Evaluation and Treatment Rehabilitation  SUBJECTIVE:                                                                                                                                                                                              SUBJECTIVE STATEMENT: Describes onset of LLE pain following DC from hospital and was told it was related to her diuretics.  Also relates falling while in inpatient but denies. Pt accompanied by: self  PERTINENT HISTORY: 51 year old female recently admitted 10/08/21 with chest pain, SOB, asthma exacerbation.  Pt currently admitted 10/23/21 for Acute respiratory failure with hypoxia secondary to acute asthma exacerbation.  Pt with past medical history significant for asthma, COPD, A-fib on Eliquis, CHF, GERD, obesity, diabetes type 2 hypothyroidism.  PAIN:  Are you having pain? Yes: NPRS scale: 10/10 Pain location: LLE Pain description: ache Aggravating factors: undetermined Relieving factors: oxycodone  PRECAUTIONS: None  WEIGHT BEARING RESTRICTIONS No  FALLS: Has patient fallen in last 6 months? No  LIVING ENVIRONMENT: Lives with: lives with their family Lives in: House/apartment Stairs:  2-3 Has following equipment at home: [place] Rile - 2 wheeled and Wheelchair (manual)  PLOF: Independent  PATIENT GOALS To get around better.  OBJECTIVE:   DIAGNOSTIC FINDINGS: one noted  COGNITION: Overall cognitive status: Within functional limits for tasks assessed   SENSATION: Not tested    MUSCLE TONE: WFL   MUSCLE  LENGTH: Hamstrings: Right 70 deg; Left 70 deg Thomas test: Left neg   POSTURE: rounded shoulders and Increased valgus knees B  LOWER EXTREMITY ROM:   (B hip mobility limited by body habitus)  Active  Right Eval Left Eval  Hip flexion 90d 90d  Hip extension    Hip abduction  Hip adduction    Hip internal rotation    Hip external rotation    Knee flexion [facility] WFL  Knee extension 0d 0d  Ankle dorsiflexion    Ankle plantarflexion    Ankle inversion    Ankle eversion     (Blank rows = not tested)  LOWER EXTREMITY MMT:    MMT Right Eval Left Eval  Hip flexion 4 4  Hip extension 4 4  Hip abduction 4- 4-  Hip adduction    Hip internal rotation    Hip external rotation    Knee flexion 4 4  Knee extension 4- 4-  Ankle dorsiflexion    Ankle plantarflexion    Ankle inversion    Ankle eversion    (Blank rows = not tested)  BED MOBILITY:  Sit to supine Complete Independence  TRANSFERS: Mod I in transfers  STAIRS:  Level of Assistance: Modified independence  Stair Negotiation Technique: Step to Pattern with Bilateral Rails  Number of Stairs: 2-3     GAIT: Gait pattern: Wide BOS, decreased step length Distance walked: 80f x2 Assistive device utilized: None Level of assistance: Complete Independence Comments: slow cadence  FUNCTIONAL TESTs:  TBD  PATIENT SURVEYS:  FOTO program did not register response  TODAY'S TREATMENT:  Evaluation only   PATIENT EDUCATION: Education details: Discussed eval findings, rehab rationale and POC and patient is in agreement  Person educated: Patient Education method: Explanation Education comprehension: needs further education   HOME EXERCISE PROGRAM: TBD    GOALS: Goals reviewed with patient? No  SHORT TERM GOALS: STGs=LTGs   LONG TERM GOALS: Target date: 12/12/2021  Patient to demonstrate independence in HEP  Baseline: TBD Goal status: INITIAL  2.  Patient to demo 5x STS in 20s w/arms crossed Baseline:  TBD Goal status: INITIAL  3.  Decrease worst pain to 6/10 Baseline: 10/10 Goal status: INITIAL  4.  Increase B hip abduction and knee extension strength to 4/5 Baseline: 4-/5 respectively Goal status: INITIAL    ASSESSMENT:  CLINICAL IMPRESSION: Patient is a 51y.o. female who was seen today for physical therapy evaluation and treatment for physical deconditioning following recent hospital admission.  Patient describes LLE pain, present while in hospital but review of noted do not find symptoms addressed.  Patient cannot identify aggravating or relieving factors.  No hip imaging studies available.  Referral for deconditioning.  Assessment finds tenderness to L lateral thigh, iliac crest to lateral epicondyle, diffusely.  No neuro signs elicited, hip mobility restricted by body habitus.  SLR and hip flexor stretch unremarkable.     OBJECTIVE IMPAIRMENTS decreased activity tolerance, decreased endurance, decreased knowledge of condition, decreased mobility, difficulty walking, decreased strength, improper body mechanics, postural dysfunction, obesity, and pain.   ACTIVITY LIMITATIONS sitting and stairs  PERSONAL FACTORS Fitness and 1-2 comorbidities: COPD, CHF  are also affecting patient's functional outcome.   REHAB POTENTIAL: Fair due to co-morbidities  CLINICAL DECISION MAKING: Evolving/moderate complexity  EVALUATION COMPLEXITY: Moderate  PLAN: PT FREQUENCY: 1x/week  PT DURATION: 4 weeks  PLANNED INTERVENTIONS: Therapeutic exercises, Therapeutic activity, Neuromuscular re-education, Balance training, Gait training, Patient/Family education, Joint mobilization, Stair training, and Re-evaluation  PLAN FOR NEXT SESSION: F/u with MD to address L hip pain, establish HEP based on MD findings, test 5x STS    JLanice Shirts PT 10/31/2021, 1:44 PM

## 2021-11-01 ENCOUNTER — Other Ambulatory Visit: Payer: Self-pay | Admitting: Internal Medicine

## 2021-11-01 ENCOUNTER — Other Ambulatory Visit (HOSPITAL_COMMUNITY): Payer: Self-pay

## 2021-11-01 MED ORDER — CLONAZEPAM 0.5 MG PO TABS: ORAL_TABLET | ORAL | 5 refills | Status: DC

## 2021-11-01 NOTE — Telephone Encounter (Signed)
Clonazepam 0.5 refilled at [facility]

## 2021-11-02 ENCOUNTER — Ambulatory Visit: Payer: Medicare Other | Admitting: Internal Medicine

## 2021-11-02 ENCOUNTER — Ambulatory Visit: Payer: Medicare Other

## 2021-11-02 ENCOUNTER — Other Ambulatory Visit (HOSPITAL_COMMUNITY): Payer: Self-pay

## 2021-11-05 ENCOUNTER — Other Ambulatory Visit (HOSPITAL_COMMUNITY): Payer: Self-pay

## 2021-11-05 ENCOUNTER — Ambulatory Visit: Payer: Medicare Other

## 2021-11-05 ENCOUNTER — Ambulatory Visit: Payer: Medicare Other | Admitting: Pharmacist

## 2021-11-05 DIAGNOSIS — J449 Chronic obstructive pulmonary disease, unspecified: Secondary | ICD-10-CM

## 2021-11-05 DIAGNOSIS — Z7189 Other specified counseling: Secondary | ICD-10-CM

## 2021-11-05 DIAGNOSIS — J4541 Moderate persistent asthma with (acute) exacerbation: Secondary | ICD-10-CM

## 2021-11-05 MED ORDER — TEZSPIRE 210 MG/1.91ML ~~LOC~~ SOAJ
210.0000 mg | SUBCUTANEOUS | 5 refills | Status: DC
  Filled 2021-11-05 – 2021-11-22 (×2): qty 1.91, 28d supply, fill #0
  Filled 2021-12-19: qty 1.91, 28d supply, fill #1
  Filled 2022-01-16: qty 1.91, 28d supply, fill #2
  Filled 2022-02-11: qty 1.91, 28d supply, fill #3
  Filled 2022-03-15: qty 1.91, 28d supply, fill #4
  Filled 2022-04-16: qty 1.91, 28d supply, fill #5

## 2021-11-05 NOTE — Patient Instructions (Signed)
Your next Tezspire dose is due on 12/03/21, 12/31/21, and every 4 weeks thereafter  CONTINUE Trelegy (1 puff once daily), montelukast '10mg'$  nightly  Your prescription will be shipped from [place]. Their phone number is [phone] Please call to schedule shipment and confirm address. They will mail your medication to your home.  How to manage an injection site reaction: Remember the 5 C's: COUNTER - leave on the counter at least 30 minutes but up to overnight to bring medication to room temperature. This may help prevent stinging COLD - place something cold (like an ice gel pack or cold water bottle) on the injection site just before cleansing with alcohol. This may help reduce pain CLARITIN - use Claritin (generic name is loratadine) for the first two weeks of treatment or the day of, the day before, and the day after injecting. This will help to minimize injection site reactions CORTISONE CREAM - apply if injection site is irritated and itching CALL ME - if injection site reaction is bigger than the size of your fist, looks infected, blisters, or if you develop hives

## 2021-11-05 NOTE — Progress Notes (Signed)
HPI Patient presents today to [facility] to see pharmacy team to be trained on Tezspire autoinjector for self-administration.  Past medical history includes moderate-persistent asthma, OSA, GERD, afib. She was previously on Xolair and Dupixent which she received in the clinic.  Respiratory Medications Current regimen: Tezspire every 4 weeks, Trelegy (1 puff once daily), montelukast '10mg'$  nightly Patient reports that remembering date of injection may be a barrier to adherence.  OBJECTIVE Allergies  Allergen Reactions   Contrast Media [Iodinated Contrast Media] Itching    CT contrast   Outpatient Encounter Medications as of 11/05/2021  Medication Sig Note   albuterol (PROAIR HFA) 108 (90 Base) MCG/ACT inhaler Inhale 2 puffs into the lungs every 6 (six) hours as needed for wheezing or shortness of breath.    amLODipine (NORVASC) 10 MG tablet TAKE 1 TABLET(10 MG) BY MOUTH DAILY    apixaban (ELIQUIS) 5 MG TABS tablet Take 1 tablet (5 mg total) by mouth 2 (two) times daily.    arformoterol (BROVANA) 15 MCG/2ML NEBU Inhale 2 mLs (15 mcg total) by nebulization 2 (two) times daily.    atorvastatin (LIPITOR) 20 MG tablet Take 1 tablet by mouth every morning.    baclofen (LIORESAL) 20 MG tablet Take 20 mg by mouth daily as needed for muscle spasms (pain).    clonazePAM (KLONOPIN) 0.5 MG tablet Take 1 tablet (0.5 mg total) by mouth daily as needed for anxiety.    clonazePAM (KLONOPIN) 0.5 MG tablet 1 or 2 for sleep as needed    cloNIDine (CATAPRES) 0.2 MG tablet Take 0.2 mg by mouth 2 (two) times daily.    diltiazem (CARDIZEM CD) 120 MG 24 hr capsule Take 1 capsule (120 mg total) by mouth daily. (Patient not taking: Reported on 10/24/2021)    DULoxetine (CYMBALTA) 60 MG capsule Take 1 capsule (60 mg total) by mouth daily.    EPINEPHrine 0.3 mg/0.3 mL IJ SOAJ injection Inject 0.3 mg into the muscle as needed for anaphylaxis.    fluticasone (FLONASE) 50 MCG/ACT nasal spray SHAKE LIQUID AND USE 1  SPRAY IN EACH NOSTRIL DAILY AS NEEDED FOR ALLERGIES OR RHINITIS (Patient taking differently: Place 1 spray into both nostrils daily.)    Fluticasone-Umeclidin-Vilant 100-62.5-25 MCG/ACT AEPB Inhale 1 puff into the lungs daily.    furosemide (LASIX) 40 MG tablet Take 1 tablet (40 mg total) by mouth daily.    levothyroxine (SYNTHROID) 50 MCG tablet Take 50 mcg by mouth daily. (Patient not taking: Reported on 10/24/2021) 10/24/2021: No record of it being filled in the past 12 months.   montelukast (SINGULAIR) 10 MG tablet Take 1 tablet (10 mg total) by mouth at bedtime.    naloxone (NARCAN) nasal spray 4 mg/0.1 mL Place 1 spray into the nose once as needed (opioid overdose).    omeprazole (PRILOSEC) 40 MG capsule Take 40 mg by mouth daily.    oxyCODONE-acetaminophen (PERCOCET) 10-325 MG tablet Take 1 tablet by mouth 5 (five) times daily as needed (nerve pain).    phentermine 15 MG capsule Take 15 mg by mouth daily. 10/24/2021: Poor compliance   potassium chloride SA (KLOR-CON M) 20 MEQ tablet TAKE 1 TABLET(20 MEQ) BY MOUTH DAILY    predniSONE (DELTASONE) 10 MG tablet Takes 3 tablets by mouth for 1 day, then take 2 tablets for 1 day, then take 1 tablet for 1 day    pregabalin (LYRICA) 300 MG capsule Take 300 mg by mouth 2 (two) times daily.    Semaglutide, 1 MG/DOSE, (OZEMPIC, 1 MG/DOSE,)  4 MG/3ML SOPN Inject 1 mg into the skin every Monday.    Tezepelumab-ekko (TEZSPIRE) 210 MG/1.91ML SOAJ Inject 210 mg into the skin every 28 (twenty-eight) days. Courier to pulm: [address], [place] [address]. Appointment on 11/05/21. 10/24/2021: Has been getting it at the doctor's office but she is supposed to start doing it herself.   topiramate (TOPAMAX) 200 MG tablet Take 200 mg by mouth at bedtime.    zolpidem (AMBIEN) 10 MG tablet TAKE 1 TABLET BY MOUTH AT BEDTIME AS NEEDED FOR SLEEP (Patient taking differently: Take 10 mg by mouth at bedtime.)    [DISCONTINUED] mometasone-formoterol (DULERA) 200-5  MCG/ACT AERO Inhale 2 puffs into the lungs 2 (two) times daily. Dx: Asthma    No facility-administered encounter medications on file as of 11/05/2021.    Immunization History  Administered Date(s) Administered   Influenza Split 01/08/2012   Influenza Whole 03/03/2009, 12/27/2009, 12/19/2010   Influenza, High Dose Seasonal PF 01/12/2019   Influenza,inj,Quad PF,6+ Mos 12/29/2012, 12/31/2013, 03/07/2015, 01/23/2016, 01/06/2017, 01/11/2020, 02/07/2021   PFIZER(Purple Top)SARS-COV-2 Vaccination 03/29/2020, 04/29/2020   PNEUMOCOCCAL CONJUGATE-20 02/20/2021   PPD Test 09/25/2011, 07/17/2012, 06/25/2013, 05/21/2016, 11/11/2017   Pneumococcal Polysaccharide-23 03/17/2009, 07/06/2016, 10/20/2016   Tdap 12/19/2010    PFTs    Latest Ref Rng & Units 08/05/2017    1:13 PM  PFT Results  FVC-Pre L 1.08   FVC-Predicted Pre % 33   Pre FEV1/FVC % % 62   FEV1-Pre L 0.67   FEV1-Predicted Pre % 25     Eosinophils Most recent blood eosinophil count was <50 cells/microL taken on 10/18/21.   IgE: 354 on 10/15/2018  Assessment   Biologics training for tezepulumab Cheron Every)  Goals of therapy: Mechanism: human monoclonal IgG2? antibody that binds to TSLP. This blocks TSLP from its effect on inflammation including reduce eosinophils, IgE, FeNO, IL-5, and IL-13. Mechanism is not definitively established. Reviewed that Cheron Every is add-on medication and patient must continue maintenance inhaler regimen. Response to therapy: may take 3-4 months to determine efficacy.  Side effects: injection site reaction (6-18%), antibody development (2%), arthralgia (4%), back pain (4%), pharyngitis (4%)  Dose: Tezspire 210 mg once every 4 weeks  Administration/Storage:  Reviewed administration sites of thigh or abdomen (at least 2-3 inches away from abdomen). Reviewed the upper arm is only appropriate if caregiver is administering injection  Do not shake pen/syringe as this could lead to product foaming or  precipitation. Do not shake syringe as this could lead to product foaming or precipitation.  Access: Approval of Tezspire through: insurance  Patient self-administered Tezspire '210mg'$ /1.91 ml in left thigh using [facility]-supplied medication Tezspire '210mg'$ /1.91 ml Autoinjector pen NDC: [phone] Lot: [id number] Expiration: 10/27/2023  Patient monitored for 30 minutes for adverse reaction.  Patient tolerated without issue. She expresses confidence with injection but  Medication Reconciliation  A drug regimen assessment was performed, including review of allergies, interactions, disease-state management, dosing and immunization history. Medications were reviewed with the patient, including name, instructions, indication, goals of therapy, potential side effects, importance of adherence, and safe use.  Drug interaction(s): none noted  PLAN Continue Tezspire '210mg'$  SQ every 28 days.  Rx sent to: [facility]: [phone] .  She will self-administer at home. Provided her with 2023 calendar to place on refrigerator with dates of Tezspire dose due dates marked. Continue maintenance asthma regimen of:  Trelegy (1 puff once daily), montelukast '10mg'$  nightly.  Reviewed how to use Combivent inhaler today. Reviewed that clonazepam refill  was sent on 11/01/21.  All questions encouraged and answered.  Instructed patient to reach out with any further questions or concerns.  Thank you for allowing pharmacy to participate in this patient's care.  This appointment required 30 minutes of patient care (this includes precharting, chart review, review of results, face-to-face care, etc.).  [name], PharmD, MPH, BCPS, CPP Clinical Pharmacist (Rheumatology and Pulmonology)

## 2021-11-08 ENCOUNTER — Telehealth: Payer: Self-pay | Admitting: Emergency Medicine

## 2021-11-08 ENCOUNTER — Encounter (HOSPITAL_COMMUNITY): Payer: Self-pay | Admitting: Emergency Medicine

## 2021-11-08 ENCOUNTER — Ambulatory Visit (HOSPITAL_COMMUNITY)
Admission: EM | Admit: 2021-11-08 | Discharge: 2021-11-08 | Disposition: A | Discharge status: Home / Self Care | Payer: Medicare Other | Attending: Internal Medicine | Admitting: Internal Medicine

## 2021-11-08 ENCOUNTER — Ambulatory Visit: Payer: Medicare Other | Admitting: Family Medicine

## 2021-11-08 DIAGNOSIS — L03011 Cellulitis of right finger: Secondary | ICD-10-CM

## 2021-11-08 DIAGNOSIS — L03019 Cellulitis of unspecified finger: Secondary | ICD-10-CM

## 2021-11-08 MED ORDER — LIDOCAINE HCL (PF) 1 % IJ SOLN
INTRAMUSCULAR | Status: AC
  Filled 2021-11-08: qty 2

## 2021-11-08 MED ORDER — AMOXICILLIN-POT CLAVULANATE 875-125 MG PO TABS: 1.0000 | ORAL_TABLET | Freq: Two times a day (BID) | ORAL | 0 refills | Status: DC

## 2021-11-08 NOTE — ED Provider Notes (Signed)
[facility]    CSN: [id number] Arrival date & time: 11/08/21  1120      History   Chief Complaint Chief Complaint  Patient presents with   Wound Check    HPI April [name] is a 51 y.o. female comes to the urgent care with a 3-week history of painful swelling of the right index finger.  She says symptoms started insidiously about 3 weeks ago and has been progressively worse.  Noticed drainage a couple of days ago resulting in a wound on the right index finger.  Swelling is persistent but decreased.  No discharge at this time.  Pain is of moderate severity, throbbing, aggravated by palpation with no known relieving factors.  No fever or chills.  No radiation of pain.  Is associated with thickening of skin on the right index finger.  No trauma to the right index finger. HPI  Past Medical History:  Diagnosis Date   Acanthosis nigricans    Anxiety    Arthritis    "knees" (04/28/2017)   Asthma    Followed by Dr. Annamaria Boots (pulmonology); receives every other week omalizumab injections; has frequent exacerbations   Back pain    Chronic diastolic CHF (congestive heart failure) ([place]) 01/17/2017   COPD (chronic obstructive pulmonary disease) ([place])    PFTs in 2002, FEV1/FVC 65, no post bronchodilater test done   Depression    GERD (gastroesophageal reflux disease)    Headache(784.0)    "q couple days" (11/94/1740)   Helicobacter pylori (H. pylori) infection    Hypertension, essential    Insomnia    Joint pain    Lower extremity edema    Menorrhagia    Morbid obesity ([place])    OSA on CPAP    Sleep study 2008 - mild OSA, not enough events to titrate CPAP; wears CPAP now/pt on 04/28/2017   Pneumonia X 1   Prediabetes    Rheumatoid arthritis (HCC)    Seasonal allergies    Shortness of breath    Tobacco user    Vitamin D deficiency     Patient Active Problem List   Diagnosis Date Noted   Severe persistent asthma with acute exacerbation in adult 08/23/2021   Hypothyroidism  08/23/2021   Chronic heart failure with preserved ejection fraction (HFpEF) ([place]) 08/23/2021   Primary osteoarthritis of right knee 07/04/2021   Body mass index 50.0-59.9, adult ([place]) 07/04/2021   Painful orthopaedic hardware ([place]) 07/04/2021   COPD with exacerbation ([place]) 02/04/2021   Severe persistent asthma 07/04/2020   Acute respiratory failure ([place]) 02/20/2019   CHF exacerbation ([place]) 02/20/2019   Hot flashes    Asthma-COPD overlap syndrome ([place]) 10/26/2018   Acute respiratory distress 08/03/2018   Acute exacerbation of chronic obstructive pulmonary disease (COPD) ([place]) 08/03/2018   Vitamin D deficiency 05/26/2018   Acute asthma exacerbation 05/16/2018   Urinary incontinence 11/17/2017   Nausea with vomiting 11/06/2017   Hypomagnesemia 10/27/2017   Dysphagia 10/27/2017   Physical deconditioning 10/25/2017   Acute maxillary sinusitis 10/24/2017   Emesis, persistent 10/08/2017   Volume depletion 10/08/2017   Chest pain 10/08/2017   SOB (shortness of breath) 10/08/2017   Debility 09/22/2017   AKI (acute kidney injury) ([place]) 08/18/2017   HCAP (healthcare-associated pneumonia) 08/06/2017   Chronic atrial fibrillation 07/29/2017   Acute respiratory failure with hypoxia ([place]) 07/07/2017   AF (paroxysmal atrial fibrillation) ([place])    Dyspnea 02/04/2017   Asthma exacerbation 81/44/8185   Metabolic syndrome 63/14/9702   Moderate persistent asthma with  exacerbation 10/19/2016   Normocytic anemia 10/02/2016   Elevated hemoglobin A1c 05/05/2016   GERD (gastroesophageal reflux disease) 08/30/2015   Anxiety and depression 08/30/2015   Generalized anxiety disorder 08/30/2015   Hyperlipidemia 12/08/2013   Seasonal allergic rhinitis 08/29/2013   Leukocytosis 10/21/2012   Tobacco abuse in remission 10/07/2012   COPD mixed type ([place]) 05/07/2012   Knee pain, bilateral 04/25/2011   Insomnia 03/14/2011   Obstructive sleep apnea 12/19/2010   Hypokalemia 08/13/2010   Cervical back pain  with evidence of disc disease 04/08/2008   HTN (hypertension) 07/31/2006   Morbid obesity due to excess calories ([place]) 06/17/2006   Major depressive disorder, recurrent episode ([place]) 04/10/2006    Past Surgical History:  Procedure Laterality Date   CARDIOVERSION N/A 05/30/2017   Procedure: CARDIOVERSION;  Surgeon: Sanda Klein, MD;  Location: [facility];  Service: Cardiovascular;  Laterality: N/A;   REDUCTION MAMMAPLASTY Bilateral 09/2011   TUBAL LIGATION  1996   bilateral    OB History     Gravida  4   Para  4   Term      Preterm      AB      Living         SAB      IAB      Ectopic      Multiple      Live Births               Home Medications    Prior to Admission medications   Medication Sig Start Date End Date Taking? Authorizing Provider  amoxicillin-clavulanate (AUGMENTIN) 875-125 MG tablet Take 1 tablet by mouth every 12 (twelve) hours. 11/08/21  Yes Sherrika Weakland, Myrene Galas, MD  albuterol (PROAIR HFA) 108 (90 Base) MCG/ACT inhaler Inhale 2 puffs into the lungs every 6 (six) hours as needed for wheezing or shortness of breath. 06/26/21   Baird Lyons D, MD  amLODipine (NORVASC) 10 MG tablet TAKE 1 TABLET(10 MG) BY MOUTH DAILY 08/27/21   [name], MD  apixaban (ELIQUIS) 5 MG TABS tablet Take 1 tablet (5 mg total) by mouth 2 (two) times daily. 06/06/21   [name], MD  arformoterol (BROVANA) 15 MCG/2ML NEBU Inhale 2 mLs (15 mcg total) by nebulization 2 (two) times daily. 10/26/21   Charlynne Cousins, MD  atorvastatin (LIPITOR) 20 MG tablet Take 1 tablet by mouth every morning. 10/26/21   Charlynne Cousins, MD  baclofen (LIORESAL) 20 MG tablet Take 20 mg by mouth daily as needed for muscle spasms (pain). 01/08/21   [provider]  clonazePAM (KLONOPIN) 0.5 MG tablet Take 1 tablet (0.5 mg total) by mouth daily as needed for anxiety. 10/18/21   Florencia Reasons, MD  clonazePAM [facility]) 0.5 MG tablet 1 or 2 for sleep as needed 11/01/21   Baird Lyons D, MD  cloNIDine (CATAPRES) 0.2 MG tablet Take 0.2 mg by mouth 2 (two) times daily.    [provider]  DULoxetine (CYMBALTA) 60 MG capsule Take 1 capsule (60 mg total) by mouth daily. 06/06/21   [name], MD  EPINEPHrine 0.3 mg/0.3 mL IJ SOAJ injection Inject 0.3 mg into the muscle as needed for anaphylaxis. 05/30/20   Baird Lyons D, MD  fluticasone (FLONASE) 50 MCG/ACT nasal spray SHAKE LIQUID AND USE 1 SPRAY IN EACH NOSTRIL DAILY AS NEEDED FOR ALLERGIES OR RHINITIS Patient taking differently: Place 1 spray into both nostrils daily. 08/05/21   [name], MD  Fluticasone-Umeclidin-Vilant 100-62.5-25 MCG/ACT AEPB Inhale 1 puff  into the lungs daily. 10/26/21   Charlynne Cousins, MD  furosemide (LASIX) 40 MG tablet Take 1 tablet (40 mg total) by mouth daily. 10/18/21   Florencia Reasons, MD  levothyroxine (SYNTHROID) 50 MCG tablet Take 50 mcg by mouth daily. Patient not taking: Reported on 10/24/2021 10/15/20   [provider]  montelukast (SINGULAIR) 10 MG tablet Take 1 tablet (10 mg total) by mouth at bedtime. 09/13/21   [name], MD  naloxone [facility]) nasal spray 4 mg/0.1 mL Place 1 spray into the nose once as needed (opioid overdose). 08/09/20   [provider]  omeprazole (PRILOSEC) 40 MG capsule Take 40 mg by mouth daily.    [provider]  oxyCODONE-acetaminophen (PERCOCET) 10-325 MG tablet Take 1 tablet by mouth 5 (five) times daily as needed (nerve pain). 08/01/21   [provider]  phentermine 15 MG capsule Take 15 mg by mouth daily. 08/10/21   [provider]  potassium chloride SA (KLOR-CON M) 20 MEQ tablet TAKE 1 TABLET(20 MEQ) BY MOUTH DAILY 10/25/21   [name], MD  pregabalin (LYRICA) 300 MG capsule Take 300 mg by mouth 2 (two) times daily. 01/08/21   [provider]  Semaglutide, 1 MG/DOSE, (OZEMPIC, 1 MG/DOSE,) 4 MG/3ML SOPN Inject 1 mg into the skin every Monday.    [provider]   Tezepelumab-ekko (TEZSPIRE) 210 MG/1.91ML SOAJ Inject 210 mg into the skin every 28 (twenty-eight) days. 11/05/21   Baird Lyons D, MD  topiramate (TOPAMAX) 200 MG tablet Take 200 mg by mouth at bedtime. 01/08/21   [provider]  zolpidem (AMBIEN) 10 MG tablet TAKE 1 TABLET BY MOUTH AT BEDTIME AS NEEDED FOR SLEEP Patient taking differently: Take 10 mg by mouth at bedtime. 07/25/21   Deneise Lever, MD  mometasone-formoterol (DULERA) 200-5 MCG/ACT AERO Inhale 2 puffs into the lungs 2 (two) times daily. Dx: Asthma 04/08/19 08/12/19  Deneise Lever, MD    Family History Family History  Problem Relation Age of Onset   Hypertension Mother    Asthma Daughter    Cancer Paternal Aunt    Asthma Maternal Grandmother     Social History Social History   Tobacco Use   Smoking status: Former    Packs/day: 0.50    Years: 26.00    Total pack years: 13.00    Types: Cigarettes    Quit date: 09/12/2014    Years since quitting: 7.1   Smokeless tobacco: Never  Vaping Use   Vaping Use: Never used  Substance Use Topics   Alcohol use: No   Drug use: No     Allergies   Contrast media [iodinated contrast media]   Review of Systems Review of Systems  Gastrointestinal: Negative.   Musculoskeletal: Negative.   Skin:  Positive for color change and wound. Negative for rash.     Physical Exam Triage Vital Signs ED Triage Vitals  Enc Vitals Group     BP 11/08/21 1316 (!) 178/94     Pulse Rate 11/08/21 1316 95     Resp 11/08/21 1316 20     Temp 11/08/21 1316 98.3 F (36.8 C)     Temp Source 11/08/21 1316 Oral     SpO2 11/08/21 1316 100 %     Weight --      Height --      Head Circumference --      Peak Flow --      Pain Score 11/08/21 1315 9  Pain Loc --      Pain Edu? --      Excl. in [place]? --    No data found.  Updated Vital Signs BP (!) 178/94 (BP Location: Right Arm)   Pulse 95   Temp 98.3 F (36.8 C) (Oral)   Resp 20   SpO2 100%   Visual Acuity Right  Eye Distance:   Left Eye Distance:   Bilateral Distance:    Right Eye Near:   Left Eye Near:    Bilateral Near:     Physical Exam Vitals and nursing note reviewed.  Constitutional:      General: She is not in acute distress.    Appearance: She is not ill-appearing.  Cardiovascular:     Rate and Rhythm: Normal rate and regular rhythm.  Musculoskeletal:        General: Swelling and tenderness present.  Skin:    Comments: Tender swelling on the right index finger.  Patient has an open wound on the lateral aspect of the nailbed of the right index finger.  Scant clear discharge.  No fluctuance on palpation of the nailbed.  No fluctuance on palpation of the nail folds.  Neurological:     Mental Status: She is alert.      UC Treatments / Results  Labs (all labs ordered are listed, but only abnormal results are displayed) Labs Reviewed - No data to display  EKG   Radiology No results found.  Procedures Incision and Drainage  Date/Time: 11/08/2021 2:45 PM  Performed by: Chase Picket, MD Authorized by: Chase Picket, MD   Consent:    Consent obtained:  Verbal   Consent given by:  Patient   Risks discussed:  Bleeding   Alternatives discussed:  No treatment Universal protocol:    Patient identity confirmed:  Verbally with patient Location:    Type:  Abscess   Location:  Upper extremity   Upper extremity location:  Finger   Finger location:  R index finger Pre-procedure details:    Skin preparation:  Povidone-iodine Sedation:    Sedation type:  None Anesthesia:    Anesthesia method:  Local infiltration   Local anesthetic:  Lidocaine 1% w/o epi Procedure type:    Complexity:  Simple Procedure details:    Incision types:  Stab incision   Drainage:  Bloody   Drainage amount:  Scant   Packing materials:  None Post-procedure details:    Procedure completion:  Tolerated well, no immediate complications  (including critical care time)  Medications Ordered  in UC Medications - No data to display  Initial Impression / Assessment and Plan / UC Course  I have reviewed the triage vital signs and the nursing notes.  Pertinent labs & imaging results that were available during my care of the patient were reviewed by me and considered in my medical decision making (see chart for details).     1.  Paronychia of the right index finger: Incision and drainage completed Warm salt water soaks Augmentin 875-125 mg twice daily for 7 days Tylenol as needed for pain. If symptoms worsen please return to urgent care to be reevaluated Final Clinical Impressions(s) / UC Diagnoses   Final diagnoses:  Paronychia of index finger     Discharge Instructions      Please take medications as prescribed Tylenol as needed for pain and/or fever Warm salt water soaks Return to urgent care if you have worsening symptoms. If wound is not healing well, you may benefit  from a Copy referral/evaluation.   ED Prescriptions     Medication Sig Dispense Auth. Provider   amoxicillin-clavulanate (AUGMENTIN) 875-125 MG tablet Take 1 tablet by mouth every 12 (twelve) hours. 14 tablet Charmel Pronovost, Myrene Galas, MD      PDMP not reviewed this encounter.   Chase Picket, MD 11/08/21 [phone]

## 2021-11-08 NOTE — ED Triage Notes (Signed)
Pt had swelling, drainage and sore around finger nail of right index finger that has been ongoing for over 2 weeks.

## 2021-11-08 NOTE — Telephone Encounter (Signed)
Copied from [place] [id number]. Topic: General - Other >> Nov 08, 2021 11:07 AM Cyndi Bender wrote: Reason for CRM: Pt requests that Opal Sidles return her call at [phone]

## 2021-11-08 NOTE — Telephone Encounter (Signed)
Call returned to patient [phone], message left with call back requested

## 2021-11-08 NOTE — Discharge Instructions (Addendum)
Please take medications as prescribed Tylenol as needed for pain and/or fever Warm salt water soaks Return to urgent care if you have worsening symptoms. If wound is not healing well, you may benefit from a hand surgeon referral/evaluation.

## 2021-11-11 NOTE — Progress Notes (Unsigned)
Patient ID: April [name], female    DOB: 03-23-1971, 51 y.o.   MRN: [id number]   HPI F former smoker followed for chronic severe asthma/ Xolair complicated by non-compliance,  OSA/ CPAP,  morbid obesity, HBP, depression, GERD, AFib/ Eliquis  NPSG 03/31/07- AHI 5.8/ hr, weight 330 lbs. She canceled planned more recent study. Office spirometry 10/24/14- severe obstructive airways disease. FVC 2.10/66%, FEV1 1.25/47%, FEV1/FVC 0.59, FEF 25-75 percent 0.64/19% Unattended HST 07/06/16- AHI 11/hour, desaturation to 74%. Office Spirometry 07/29/2016-moderately severe obstructive airways disease. FVC 2.21/68%, FEV1 1.53/57%, ratio 0.69, FEF 25-75% 0.98/34% Prolonged hosp [facility] 4/19- tracheostomy. Dupixent enrolled 10/22/2018 HST 12/26/2018- AHI 9.7/ hr, desaturation to 9.7/ hr, desaturation to 69% -------------------------------------------------------   07/31/21- 51 year old female Smoker followed for chronic severe asthma/Xolair, Tracheostomy at [facility] 2019, Insomnia,  complicated by noncompliance, OSA/ oral appliance, morbid obesity, HTN, depression, GERD Covid infection, PAFib, dCHF, Dyslipidemia,  Dupixent enrolled 10/22/2018-- every 2 weeks -Proair hfa, Trelegy  100, Singulair, Neb Duoneb, Dupixent, Ambien 10, Vyvanse, [place], [place] 1/24-1/28- Asthma exacerb ? Viral>  Says she thinks rrelegy and Dupixent help. Took some prednisone she had at home for a rough patch this week. Wants to try Combivent as a rescue inhaler. Trying to avoid smoke exposure, although some of her family smoke around her.  CTachest 05/23/21- IMPRESSION: 1. No pulmonary emboli or acute abnormality. 2. Mild bilateral centrilobular and paraseptal bullous emphysema. 3. Mild atheromatous calcifications involving the left anterior descending coronary artery. 4. Progressive changes of discogenic sclerosis involving the C6, C7 and T1 vertebrae.  11/13/21- 51 year old female Smoker followed for chronic severe asthma/Xolair,  Tracheostomy at [facility] 2019, Insomnia,  complicated by noncompliance, OSA/ oral appliance, morbid obesity, HTN, depression, GERD Covid infection, PAFib, dCHF, Dyslipidemia,  Dupixent enrolled 10/22/2018-- every 2 weeks -Proair hfa, Trelegy  100, Singulair, Neb [place], Tezspire, Ambien 10, Vyvanse, Tezspire, Phentermine, Clonazepam,  Body weight today- Educated 7/10 on self-injection Tezspire.  CXR 10/23/21- IMPRESSION: No active cardiopulmonary disease. HRCT chest 10/14/21-  IMPRESSION: 1. No acute airspace opacity. 2. Mild upper and anterior lung predominant subpleural and peribronchovascular reticular opacities with no traction bronchiectasis or evidence of air trapping, findings are new when compared with January 27, 2017 exam, but improved when compared with November 07, 2017 exam. Findings are likely due to scarring related to prior acute lung injury. 3. Moderate coronary artery calcifications of the LAD. 4. Mild emphysema (ICD10-J43.9).  Review of Systems-See HPI  + = positive Constitutional:   + weight loss, night sweats, fevers, chills, +fatigue, lassitude. HEENT:   No-  headaches, difficulty swallowing, tooth/dental problems, sore throat,    sneezing, itching, ear ache, +nasal congestion, post nasal drip,  CV:  No-   chest pain, orthopnea, PND, swelling in lower extremities, anasarca, dizziness, palpitations Resp: +shortness of breath with exertion or at rest.              productive cough,  + non-productive cough,  No- coughing up of blood.              No-   change in color of mucus. +wheezing.   Skin: No-   rash or lesions. GI:  No-   heartburn, indigestion, abdominal pain, nausea, vomiting,  GU: MS:  No-   joint pain or swelling.   Neuro-     nothing unusual Psych:  No- change in mood or affect. No depression or anxiety.  No memory loss.    Objective:   Physical Exam    General- Alert, Oriented,  Affect-appropriate, Distress- none acute, + morbid  obesity,  Skin- rash-none,  lesions- none, excoriation- none Lymphadenopathy- none Head- atraumatic            Eyes- Gross vision intact, PERRLA, conjunctivae clear secretions            Ears- Hearing, canals-normal            Nose- Clear, no-Septal dev, mucus, polyps, erosion, perforation.            Throat- Mallampati III , mucosa clear , drainage- none, tonsils- atrophic,                           + Missing  teeth, +upper denture plate is out                  Neck- flexible , trachea+ stoma scar, no stridor , thyroid nl, carotid no bruit Chest - symmetrical excursion , unlabored           Heart/CV- RRR , no murmur , no gallop  , no rub, nl s1 s2                           - JVD- none , edema- none, stasis changes- none, varices- none           Lung- +Diminished, . Wheeze -none, cough+minimal,  dullness-none,  rub- none            Chest wall Abd-  Br/ Gen/ Rectal- Not done, not indicated Extrem- cyanosis- none, clubbing, none, atrophy- none, strength- nl Neuro- grossly intact to observation

## 2021-11-13 ENCOUNTER — Ambulatory Visit (INDEPENDENT_AMBULATORY_CARE_PROVIDER_SITE_OTHER): Payer: Medicare Other | Admitting: Internal Medicine

## 2021-11-13 ENCOUNTER — Encounter: Payer: Self-pay | Admitting: Internal Medicine

## 2021-11-13 DIAGNOSIS — J441 Chronic obstructive pulmonary disease with (acute) exacerbation: Secondary | ICD-10-CM | POA: Diagnosis not present

## 2021-11-13 MED ORDER — ALBUTEROL SULFATE HFA 108 (90 BASE) MCG/ACT IN AERS: 2.0000 | INHALATION_SPRAY | Freq: Four times a day (QID) | RESPIRATORY_TRACT | 11 refills | Status: DC | PRN

## 2021-11-13 NOTE — Assessment & Plan Note (Signed)
Has mostly resolved recent exacerbation but with residual end expiratory wheezing which may be baseline.  She and I both want to avoid more systemic steroids now if possible.  Medications reviewed. Plan-refill rescue albuterol inhaler.  Continue Tezspire

## 2021-11-13 NOTE — Telephone Encounter (Signed)
Tried to cb, pls FU asap per pt waiting for return call [phone]

## 2021-11-13 NOTE — Therapy (Deleted)
OUTPATIENT PHYSICAL THERAPY TREATMENT NOTE   Patient Name: April [name] MRN: [id number] DOB:[birth year], 51 y.o., female Today's Date: 11/13/2021  PCP: [name], MD REFERRING PROVIDER: [name], MD  END OF SESSION:    Past Medical History:  Diagnosis Date   Acanthosis nigricans    Anxiety    Arthritis    "knees" (04/28/2017)   Asthma    Followed by Dr. Annamaria Boots (pulmonology); receives every other week omalizumab injections; has frequent exacerbations   Back pain    Chronic diastolic CHF (congestive heart failure) ([place]) 01/17/2017   COPD (chronic obstructive pulmonary disease) ([place])    PFTs in 2002, FEV1/FVC 65, no post bronchodilater test done   Depression    GERD (gastroesophageal reflux disease)    Headache(784.0)    "q couple days" (54/62/7035)   Helicobacter pylori (H. pylori) infection    Hypertension, essential    Insomnia    Joint pain    Lower extremity edema    Menorrhagia    Morbid obesity ([place])    OSA on CPAP    Sleep study 2008 - mild OSA, not enough events to titrate CPAP; wears CPAP now/pt on 04/28/2017   Pneumonia X 1   Prediabetes    Rheumatoid arthritis (HCC)    Seasonal allergies    Shortness of breath    Tobacco user    Vitamin D deficiency    Past Surgical History:  Procedure Laterality Date   CARDIOVERSION N/A 05/30/2017   Procedure: CARDIOVERSION;  Surgeon: Sanda Klein, MD;  Location: [place];  Service: Cardiovascular;  Laterality: N/A;   REDUCTION MAMMAPLASTY Bilateral 09/2011   TUBAL LIGATION  1996   bilateral   Patient Active Problem List   Diagnosis Date Noted   Severe persistent asthma with acute exacerbation in adult 08/23/2021   Hypothyroidism 08/23/2021   Chronic heart failure with preserved ejection fraction (HFpEF) ([place]) 08/23/2021   Primary osteoarthritis of right knee 07/04/2021   Body mass index 50.0-59.9, adult ([place]) 07/04/2021   Painful orthopaedic hardware ([place]) 07/04/2021   COPD with exacerbation ([place])  02/04/2021   Severe persistent asthma 07/04/2020   Acute respiratory failure ([place]) 02/20/2019   CHF exacerbation ([place]) 02/20/2019   Hot flashes    Asthma-COPD overlap syndrome ([place]) 10/26/2018   Acute respiratory distress 08/03/2018   Acute exacerbation of chronic obstructive pulmonary disease (COPD) ([place] ) 08/03/2018   Vitamin D deficiency 05/26/2018   Acute asthma exacerbation 05/16/2018   Urinary incontinence 11/17/2017   Nausea with vomiting 11/06/2017   Hypomagnesemia 10/27/2017   Dysphagia 10/27/2017   Physical deconditioning 10/25/2017   Acute maxillary sinusitis 10/24/2017   Emesis, persistent 10/08/2017   Volume depletion 10/08/2017   Chest pain 10/08/2017   SOB (shortness of breath) 10/08/2017   Debility 09/22/2017   AKI (acute kidney injury) ([place]) 08/18/2017   HCAP (healthcare-associated pneumonia) 08/06/2017   Chronic atrial fibrillation 07/29/2017   Acute respiratory failure with hypoxia ([place]) 07/07/2017   AF (paroxysmal atrial fibrillation) ([place])    Dyspnea 02/04/2017   Asthma exacerbation 00/93/8182   Metabolic syndrome 99/37/1696   Moderate persistent asthma with exacerbation 10/19/2016   Normocytic anemia 10/02/2016   Elevated hemoglobin A1c 05/05/2016   GERD (gastroesophageal reflux disease) 08/30/2015   Anxiety and depression 08/30/2015   Generalized anxiety disorder 08/30/2015   Hyperlipidemia 12/08/2013   Seasonal allergic rhinitis 08/29/2013   Leukocytosis 10/21/2012   Tobacco abuse in remission 10/07/2012   COPD mixed type ([place]) 05/07/2012   Knee pain, bilateral 04/25/2011   Insomnia 03/14/2011  Obstructive sleep apnea 12/19/2010   Hypokalemia 08/13/2010   Cervical back pain with evidence of disc disease 04/08/2008   HTN (hypertension) 07/31/2006   Morbid obesity due to excess calories ([place]) 06/17/2006   Major depressive disorder, recurrent episode ([place]) 04/10/2006    REFERRING DIAG: R53.81 (ICD-10-CM) - Physical deconditioning   THERAPY  DIAG: physical deconditioning   Rationale for Evaluation and Treatment Rehabilitation  PERTINENT HISTORY: 51 year old female recently admitted 10/08/21 with chest pain, SOB, asthma exacerbation.  Pt currently admitted 10/23/21 for Acute respiratory failure with hypoxia secondary to acute asthma exacerbation.  Pt with past medical history significant for asthma, COPD, A-fib on Eliquis, CHF, GERD, obesity, diabetes type 2 hypothyroidism.    PRECAUTIONS: None  SUBJECTIVE: ***  PAIN:  Are you having pain? {OPRCPAIN:[id number]}   OBJECTIVE: (objective measures completed at initial evaluation unless otherwise dated)  DIAGNOSTIC FINDINGS: one noted   COGNITION: Overall cognitive status: Within functional limits for tasks assessed             SENSATION: Not tested       MUSCLE TONE: WFL     MUSCLE LENGTH: Hamstrings: Right 70 deg; Left 70 deg Thomas test: Left neg     POSTURE: rounded shoulders and Increased valgus knees B   LOWER EXTREMITY ROM:   (B hip mobility limited by body habitus)   Active  Right Eval Left Eval  Hip flexion 90d 90d  Hip extension      Hip abduction      Hip adduction      Hip internal rotation      Hip external rotation      Knee flexion [facility] WFL  Knee extension 0d 0d  Ankle dorsiflexion      Ankle plantarflexion      Ankle inversion      Ankle eversion       (Blank rows = not tested)   LOWER EXTREMITY MMT:     MMT Right Eval Left Eval  Hip flexion 4 4  Hip extension 4 4  Hip abduction 4- 4-  Hip adduction      Hip internal rotation      Hip external rotation      Knee flexion 4 4  Knee extension 4- 4-  Ankle dorsiflexion      Ankle plantarflexion      Ankle inversion      Ankle eversion      (Blank rows = not tested)   BED MOBILITY:  Sit to supine Complete Independence   TRANSFERS: Mod I in transfers   STAIRS:           Level of Assistance: Modified independence           Stair Negotiation Technique: Step to Pattern with  Bilateral Rails           Number of Stairs: 2-3                       GAIT: Gait pattern: Wide BOS, decreased step length Distance walked: 50f x2 Assistive device utilized: None Level of assistance: Complete Independence Comments: slow cadence   FUNCTIONAL TESTs:  TBD   PATIENT SURVEYS:  FOTO program did not register response   TODAY'S TREATMENT:  Evaluation only     PATIENT EDUCATION: Education details: Discussed eval findings, rehab rationale and POC and patient is in agreement  Person educated: Patient Education method: Explanation Education comprehension: needs further education     HOME EXERCISE PROGRAM: TBD  GOALS: Goals reviewed with patient? No   SHORT TERM GOALS: STGs=LTGs     LONG TERM GOALS: Target date: 12/12/2021   Patient to demonstrate independence in HEP  Baseline: TBD Goal status: INITIAL   2.  Patient to demo 5x STS in 20s w/arms crossed Baseline: TBD Goal status: INITIAL   3.  Decrease worst pain to 6/10 Baseline: 10/10 Goal status: INITIAL   4.  Increase B hip abduction and knee extension strength to 4/5 Baseline: 4-/5 respectively Goal status: INITIAL       ASSESSMENT:   CLINICAL IMPRESSION: Patient is a 51 y.o. female who was seen today for physical therapy evaluation and treatment for physical deconditioning following recent hospital admission.  Patient describes LLE pain, present while in hospital but review of noted do not find symptoms addressed.  Patient cannot identify aggravating or relieving factors.  No hip imaging studies available.  Referral for deconditioning.  Assessment finds tenderness to L lateral thigh, iliac crest to lateral epicondyle, diffusely.  No neuro signs elicited, hip mobility restricted by body habitus.  SLR and hip flexor stretch unremarkable.       OBJECTIVE IMPAIRMENTS decreased activity tolerance, decreased endurance, decreased knowledge of condition, decreased mobility, difficulty walking,  decreased strength, improper body mechanics, postural dysfunction, obesity, and pain.    ACTIVITY LIMITATIONS sitting and stairs   PERSONAL FACTORS Fitness and 1-2 comorbidities: COPD, CHF  are also affecting patient's functional outcome.    REHAB POTENTIAL: Fair due to co-morbidities   CLINICAL DECISION MAKING: Evolving/moderate complexity   EVALUATION COMPLEXITY: Moderate   PLAN: PT FREQUENCY: 1x/week   PT DURATION: 4 weeks   PLANNED INTERVENTIONS: Therapeutic exercises, Therapeutic activity, Neuromuscular re-education, Balance training, Gait training, Patient/Family education, Joint mobilization, Stair training, and Re-evaluation   PLAN FOR NEXT SESSION: F/u with MD to address L hip pain, establish HEP based on MD findings, test 5x STS      Lanice Shirts, PT 11/13/2021, 10:35 AM

## 2021-11-13 NOTE — Telephone Encounter (Signed)
I called patient and she was with Dr Annamaria Boots at the time. She said she will call me back.

## 2021-11-13 NOTE — Patient Instructions (Signed)
Albuterol rescue inhaler refilled  I hope the Tezspire therapy helps.  Please call if we can help

## 2021-11-13 NOTE — Assessment & Plan Note (Signed)
This represents a major healthcare burden for her.  She has had several interventions trying to help.  Hopefully we can avoid systemic steroids anytime soon.

## 2021-11-14 ENCOUNTER — Ambulatory Visit: Payer: Medicare Other

## 2021-11-14 MED ORDER — CLONIDINE HCL 0.2 MG PO TABS: 0.2000 mg | ORAL_TABLET | Freq: Two times a day (BID) | ORAL | 3 refills | Status: DC

## 2021-11-14 NOTE — Telephone Encounter (Signed)
Refill for clonidine has been sent to her pharmacy

## 2021-11-14 NOTE — Addendum Note (Signed)
Addended by: [name] on: 11/14/2021 05:23 PM   Modules accepted: Orders

## 2021-11-14 NOTE — Telephone Encounter (Signed)
Call returned to patient, she said her appointment went well with Dr Annamaria Boots yesterday.  She had some wheezing and he told her to stay inside.  She is requesting a refill of clonidine . She also said that her lasix is too strong, she is not taking it because she was urinating too frequently. She has been scheduled to see Dr Margarita Rana - 12/03/2021.

## 2021-11-16 ENCOUNTER — Other Ambulatory Visit: Payer: Self-pay | Admitting: Pharmacy Technician

## 2021-11-22 ENCOUNTER — Other Ambulatory Visit (HOSPITAL_COMMUNITY): Payer: Self-pay

## 2021-11-26 ENCOUNTER — Other Ambulatory Visit (HOSPITAL_COMMUNITY): Payer: Self-pay

## 2021-11-27 ENCOUNTER — Other Ambulatory Visit (HOSPITAL_COMMUNITY): Payer: Self-pay

## 2021-11-28 ENCOUNTER — Ambulatory Visit: Payer: Medicare Other | Attending: Family Medicine | Admitting: Family Medicine

## 2021-11-28 ENCOUNTER — Encounter: Payer: Self-pay | Admitting: Family Medicine

## 2021-11-28 VITALS — BP 131/79 | HR 81 | Temp 97.9°F | Wt 339.2 lb

## 2021-11-28 DIAGNOSIS — J4541 Moderate persistent asthma with (acute) exacerbation: Secondary | ICD-10-CM

## 2021-11-28 DIAGNOSIS — E1159 Type 2 diabetes mellitus with other circulatory complications: Secondary | ICD-10-CM

## 2021-11-28 DIAGNOSIS — I480 Paroxysmal atrial fibrillation: Secondary | ICD-10-CM

## 2021-11-28 DIAGNOSIS — Z1211 Encounter for screening for malignant neoplasm of colon: Secondary | ICD-10-CM

## 2021-11-28 DIAGNOSIS — E119 Type 2 diabetes mellitus without complications: Secondary | ICD-10-CM | POA: Diagnosis not present

## 2021-11-28 DIAGNOSIS — G629 Polyneuropathy, unspecified: Secondary | ICD-10-CM

## 2021-11-28 DIAGNOSIS — F419 Anxiety disorder, unspecified: Secondary | ICD-10-CM

## 2021-11-28 DIAGNOSIS — E038 Other specified hypothyroidism: Secondary | ICD-10-CM

## 2021-11-28 DIAGNOSIS — G4709 Other insomnia: Secondary | ICD-10-CM

## 2021-11-28 DIAGNOSIS — I152 Hypertension secondary to endocrine disorders: Secondary | ICD-10-CM

## 2021-11-28 LAB — POCT GLYCOSYLATED HEMOGLOBIN (HGB A1C): HbA1c, POC (controlled diabetic range): 6.3 % (ref 0.0–7.0)

## 2021-11-28 MED ORDER — LEVOTHYROXINE SODIUM 50 MCG PO TABS: 50.0000 ug | ORAL_TABLET | Freq: Every day | ORAL | 1 refills | Status: DC

## 2021-11-28 MED ORDER — APIXABAN 5 MG PO TABS: 5.0000 mg | ORAL_TABLET | Freq: Two times a day (BID) | ORAL | 1 refills | Status: DC

## 2021-11-28 MED ORDER — FUROSEMIDE 20 MG PO TABS: 20.0000 mg | ORAL_TABLET | Freq: Every day | ORAL | 3 refills | Status: DC

## 2021-11-28 NOTE — Progress Notes (Signed)
Requesting PCS service.

## 2021-11-28 NOTE — Patient Instructions (Addendum)
[facility] behavioral health appointment: Scheduled w/ Christina Hussami 12/11/21 and Dr. Adele Schilder 01/07/22 [address], [place], [place] [address]

## 2021-11-28 NOTE — Progress Notes (Signed)
Subjective:  Patient ID: April [name], female    DOB: 10/29/1970  Age: 51 y.o. MRN: [id number]  CC: Hospitalization Follow-up   HPI April [name] is a 51 y.o. year old female with a history of  morbid obesity, major depressive disorder, hypertension, COPD/ Asthma , paroxysmal atrial fibrillation obstructive sleep apnea (not compliant with CPAP) with multiple hospitalizations for asthma exacerbation/COPD , steroid-induced diabetes  (previously on metformin)  Interval History: She had a visit with Dr Annamaria Boots 2 weeks ago and was placed on Klonopin in addition to Ambien but still continues to struggle with insomnia. Asthma is stable and she is doing well on Tezspire and her inhalers. She is seeing Romelle Starcher for her weight management and is unhappy with her progress as she was previously on Phentermine and Ozempic which she states she discontinued when she was hospitalized.  Her frequent need for prednisone is also seeing her weight gain and in addition she received cortisone injections in her knees.  She Complains furosemide makes her wet the bed at night and would like a reduction in dose of furosemide from 40 to 20 mg.  Regards to her diabetes mellitus she has been on diet control and A1c is 6.3 up from 5.9 previously. She had previously requested referral to psychiatry for her anxiety and she has upcoming appointment next week with a therapist and in 1 month with psychiatry. She would also like PCS services to assist with ADLs. Past Medical History:  Diagnosis Date   Acanthosis nigricans    Anxiety    Arthritis    "knees" (04/28/2017)   Asthma    Followed by Dr. Annamaria Boots (pulmonology); receives every other week omalizumab injections; has frequent exacerbations   Back pain    Chronic diastolic CHF (congestive heart failure) ([place]) 01/17/2017   COPD (chronic obstructive pulmonary disease) ([place])    PFTs in 2002, FEV1/FVC 65, no post bronchodilater test done   Depression    GERD (gastroesophageal  reflux disease)    Headache(784.0)    "q couple days" (82/42/3536)   Helicobacter pylori (H. pylori) infection    Hypertension, essential    Insomnia    Joint pain    Lower extremity edema    Menorrhagia    Morbid obesity ([place])    OSA on CPAP    Sleep study 2008 - mild OSA, not enough events to titrate CPAP; wears CPAP now/pt on 04/28/2017   Pneumonia X 1   Prediabetes    Rheumatoid arthritis (HCC)    Seasonal allergies    Shortness of breath    Tobacco user    Vitamin D deficiency     Past Surgical History:  Procedure Laterality Date   CARDIOVERSION N/A 05/30/2017   Procedure: CARDIOVERSION;  Surgeon: Sanda Klein, MD;  Location: [facility];  Service: Cardiovascular;  Laterality: N/A;   REDUCTION MAMMAPLASTY Bilateral 09/2011   TUBAL LIGATION  1996   bilateral    Family History  Problem Relation Age of Onset   Hypertension Mother    Asthma Daughter    Cancer Paternal Aunt    Asthma Maternal Grandmother     Social History   Socioeconomic History   Marital status: Single    Spouse name: Not on file   Number of children: 1   Years of education: Not on file   Highest education level: Not on file  Occupational History   Occupation: stay at home  Tobacco Use   Smoking status: Former    Packs/day: 0.50  Years: 26.00    Total pack years: 13.00    Types: Cigarettes    Quit date: 09/12/2014    Years since quitting: 7.2   Smokeless tobacco: Never   Tobacco comments:    Has vaped before, caught in the hospital  Vaping Use   Vaping Use: Never used  Substance and Sexual Activity   Alcohol use: No   Drug use: No   Sexual activity: Never  Other Topics Concern   Not on file  Social History Narrative   Daughters are grown, not married, works as a Quarry manager.   Social Determinants of Health   Financial Resource Strain: Not on file  Food Insecurity: Not on file  Transportation Needs: Not on file  Physical Activity: Not on file  Stress: Not on file  Social  Connections: Not on file    Allergies  Allergen Reactions   Contrast Media [Iodinated Contrast Media] Itching    CT contrast    Outpatient Medications Prior to Visit  Medication Sig Dispense Refill   albuterol (PROAIR HFA) 108 (90 Base) MCG/ACT inhaler Inhale 2 puffs into the lungs every 6 (six) hours as needed for wheezing or shortness of breath. 8.5 g 11   amLODipine (NORVASC) 10 MG tablet TAKE 1 TABLET(10 MG) BY MOUTH DAILY 90 tablet 1   amoxicillin-clavulanate (AUGMENTIN) 875-125 MG tablet Take 1 tablet by mouth every 12 (twelve) hours. 14 tablet 0   arformoterol (BROVANA) 15 MCG/2ML NEBU Inhale 2 mLs (15 mcg total) by nebulization 2 (two) times daily. 120 mL 0   atorvastatin (LIPITOR) 20 MG tablet Take 1 tablet by mouth every morning. 30 tablet 3   baclofen (LIORESAL) 20 MG tablet Take 20 mg by mouth daily as needed for muscle spasms (pain).     clonazePAM (KLONOPIN) 0.5 MG tablet Take 1 tablet (0.5 mg total) by mouth daily as needed for anxiety. 10 tablet 0   clonazePAM (KLONOPIN) 0.5 MG tablet 1 or 2 for sleep as needed 60 tablet 5   cloNIDine (CATAPRES) 0.2 MG tablet Take 1 tablet (0.2 mg total) by mouth 2 (two) times daily. 60 tablet 3   DULoxetine (CYMBALTA) 60 MG capsule Take 1 capsule (60 mg total) by mouth daily. 90 capsule 1   EPINEPHrine 0.3 mg/0.3 mL IJ SOAJ injection Inject 0.3 mg into the muscle as needed for anaphylaxis. 1 each 5   fluticasone (FLONASE) 50 MCG/ACT nasal spray SHAKE LIQUID AND USE 1 SPRAY IN EACH NOSTRIL DAILY AS NEEDED FOR ALLERGIES OR RHINITIS (Patient taking differently: Place 1 spray into both nostrils daily.) 16 g 0   Fluticasone-Umeclidin-Vilant 100-62.5-25 MCG/ACT AEPB Inhale 1 puff into the lungs daily. 60 each 5   montelukast (SINGULAIR) 10 MG tablet Take 1 tablet (10 mg total) by mouth at bedtime. 90 tablet 0   naloxone (NARCAN) nasal spray 4 mg/0.1 mL Place 1 spray into the nose once as needed (opioid overdose).     omeprazole (PRILOSEC) 40 MG  capsule Take 40 mg by mouth daily.     oxyCODONE-acetaminophen (PERCOCET) 10-325 MG tablet Take 1 tablet by mouth 5 (five) times daily as needed (nerve pain).     phentermine 15 MG capsule Take 15 mg by mouth daily.     potassium chloride SA (KLOR-CON M) 20 MEQ tablet TAKE 1 TABLET(20 MEQ) BY MOUTH DAILY 90 tablet 0   pregabalin (LYRICA) 300 MG capsule Take 300 mg by mouth 2 (two) times daily.     Semaglutide, 1 MG/DOSE, (OZEMPIC, 1 MG/DOSE,) 4  MG/3ML SOPN Inject 1 mg into the skin every Monday.     Tezepelumab-ekko (TEZSPIRE) 210 MG/1.91ML SOAJ Inject 210 mg into the skin every 28 (twenty-eight) days. 1.91 mL 5   topiramate (TOPAMAX) 200 MG tablet Take 200 mg by mouth at bedtime.     zolpidem (AMBIEN) 10 MG tablet TAKE 1 TABLET BY MOUTH AT BEDTIME AS NEEDED FOR SLEEP (Patient taking differently: Take 10 mg by mouth at bedtime.) 30 tablet 5   apixaban (ELIQUIS) 5 MG TABS tablet Take 1 tablet (5 mg total) by mouth 2 (two) times daily. 180 tablet 1   furosemide (LASIX) 40 MG tablet Take 1 tablet (40 mg total) by mouth daily. 30 tablet 6   levothyroxine (SYNTHROID) 50 MCG tablet Take 50 mcg by mouth daily.     No facility-administered medications prior to visit.     ROS Review of Systems  Constitutional:  Negative for activity change and appetite change.  HENT:  Negative for sinus pressure and sore throat.   Respiratory:  Negative for chest tightness, shortness of breath and wheezing.   Cardiovascular:  Negative for chest pain and palpitations.  Gastrointestinal:  Negative for abdominal distention, abdominal pain and constipation.  Genitourinary: Negative.   Musculoskeletal: Negative.   Neurological:  Positive for numbness.  Psychiatric/Behavioral:  Positive for sleep disturbance. Negative for behavioral problems and dysphoric mood.     Objective:  BP 131/79   Pulse 81   Temp 97.9 F (36.6 C) (Oral)   Wt (!) 339 lb 3.2 oz (153.9 kg)   SpO2 100%   BMI 54.75 kg/m      11/28/2021    11:54 AM 11/13/2021    1:34 PM 11/08/2021    1:16 PM  BP/Weight  Systolic BP 831 517 616  Diastolic BP 79 82 94  Wt. (Lbs) 339.2 339   BMI 54.75 kg/m2 54.72 kg/m2       Physical Exam Constitutional:      Appearance: She is well-developed.  Cardiovascular:     Rate and Rhythm: Normal rate.     Heart sounds: Normal heart sounds. No murmur heard. Pulmonary:     Effort: Pulmonary effort is normal.     Breath sounds: Normal breath sounds. No wheezing or rales.  Chest:     Chest wall: No tenderness.  Abdominal:     General: Bowel sounds are normal. There is no distension.     Palpations: Abdomen is soft. There is no mass.     Tenderness: There is no abdominal tenderness.  Musculoskeletal:        General: Normal range of motion.     Right lower leg: No edema.     Left lower leg: No edema.  Neurological:     Mental Status: She is alert and oriented to person, place, and time.  Psychiatric:        Mood and Affect: Mood normal.    Diabetic Foot Exam - Simple   Simple Foot Form Diabetic Foot exam was performed with the following findings: Yes 11/28/2021 12:43 PM  Visual Inspection See comments: Yes Sensation Testing Intact to touch and monofilament testing bilaterally: Yes Pulse Check Posterior Tibialis and Dorsalis pulse intact bilaterally: Yes Comments Dry skin on soles of both feet, no ulceration         Latest Ref Rng & Units 10/24/2021    7:57 AM 10/23/2021    5:05 PM 10/23/2021    4:32 PM  CMP  Glucose 70 - 99 mg/dL 160  118  116   BUN 6 - 20 mg/dL '18  19  20   '$ Creatinine 0.44 - 1.00 mg/dL 0.87  1.10  1.20   Sodium 135 - 145 mmol/L 141  140  140   Potassium 3.5 - 5.1 mmol/L 4.3  3.9  4.0   Chloride 98 - 111 mmol/L 112  109  111   CO2 22 - 32 mmol/L 22   21   Calcium 8.9 - 10.3 mg/dL 8.7   9.0   Total Protein 6.5 - 8.1 g/dL   6.9   Total Bilirubin 0.3 - 1.2 mg/dL   0.3   Alkaline Phos 38 - 126 U/L   71   AST 15 - 41 U/L   15   ALT 0 - 44 U/L   18     Lipid  Panel     Component Value Date/Time   CHOL 192 10/16/2021 0506   CHOL 238 (H) 03/30/2020 1128   TRIG 51 10/16/2021 0506   HDL 99 10/16/2021 0506   HDL 45 03/30/2020 1128   CHOLHDL 1.9 10/16/2021 0506   VLDL 10 10/16/2021 0506   LDLCALC 83 10/16/2021 0506   LDLCALC 167 (H) 03/30/2020 1128    CBC    Component Value Date/Time   WBC 16.3 (H) 10/24/2021 0757   RBC 4.21 10/24/2021 0757   HGB 10.7 (L) 10/24/2021 0757   HGB 10.4 (L) 01/22/2018 1403   HCT 34.8 (L) 10/24/2021 0757   HCT 33.3 (L) 01/22/2018 1403   PLT 307 10/24/2021 0757   PLT 616 (H) 01/22/2018 1403   MCV 82.7 10/24/2021 0757   MCV 82 01/22/2018 1403   MCH 25.4 (L) 10/24/2021 0757   MCHC 30.7 10/24/2021 0757   RDW 19.9 (H) 10/24/2021 0757   RDW 17.1 (H) 01/22/2018 1403   LYMPHSABS 1.6 10/18/2021 0531   LYMPHSABS 3.3 (H) 01/22/2018 1403   MONOABS 1.3 (H) 10/18/2021 0531   EOSABS 0.0 10/18/2021 0531   EOSABS 0.3 01/22/2018 1403   BASOSABS 0.1 10/18/2021 0531   BASOSABS 0.1 01/22/2018 1403    Lab Results  Component Value Date   HGBA1C 5.9 (H) 05/24/2021    Lab Results  Component Value Date   TSH 2.686 10/23/2021    Assessment & Plan:  1. Type 2 diabetes mellitus without complication, without long-term current use of insulin (HCC) A1c of 6.3 She is currently on diet control Counseled on Diabetic diet, my plate method, 211 minutes of moderate intensity exercise/week Blood sugar logs with fasting goals of 80-120 mg/dl, random of less than 180 and in the event of sugars less than 60 mg/dl or greater than 400 mg/dl encouraged to notify the clinic. Advised on the need for annual eye exams, annual foot exams, Pneumonia vaccine. - Ambulatory referral to Ophthalmology - Microalbumin/Creatinine Ratio, Urine - POCT glycosylated hemoglobin (Hb A1C)  2. AF (paroxysmal atrial fibrillation) (HCC) Currently in sinus rhythm - apixaban (ELIQUIS) 5 MG TABS tablet; Take 1 tablet (5 mg total) by mouth 2 (two) times  daily.  Dispense: 180 tablet; Refill: 1  3. Screening for colon cancer - Ambulatory referral to Gastroenterology  4. Anxiety Referred to psychiatry and she has an upcoming appointment next week  5. Neuropathy Uncontrolled Currently on Lyrica for pain management She does qualify for PCS services and we will complete form  6. Other insomnia Uncontrolled She is on multiple sedatives on high risk medications including Ambien, benzodiazepine and opiate Counseled on sleep hygiene and the need to begin  a regular exercise regimen which will also help with insomnia She is agreeable to this  7. Moderate persistent asthma with acute exacerbation Stable with no flares Continue test prior, inhalers Follow-up with pulmonary  8. Morbid obesity due to excess calories ([place]) Unfortunately repeated need for steroid contributing Advised to restart GLP-1 RA which was prescribed by her weight management clinician We have discussed beginning a daily walking exercise regimen  9. Other specified hypothyroidism Normal thyroid panel from 09/2021 - levothyroxine (SYNTHROID) 50 MCG tablet; Take 1 tablet (50 mcg total) by mouth daily.  Dispense: 90 tablet; Refill: 1  10.  Hypertension associated with diabetes Controlled Continue amlodipine I have decreased her Lasix dose from 40 mg to 20 mg  Meds ordered this encounter  Medications   apixaban (ELIQUIS) 5 MG TABS tablet    Sig: Take 1 tablet (5 mg total) by mouth 2 (two) times daily.    Dispense:  180 tablet    Refill:  1   levothyroxine (SYNTHROID) 50 MCG tablet    Sig: Take 1 tablet (50 mcg total) by mouth daily.    Dispense:  90 tablet    Refill:  1   furosemide (LASIX) 20 MG tablet    Sig: Take 1 tablet (20 mg total) by mouth daily.    Dispense:  30 tablet    Refill:  3    Follow-up: Return in about 3 months (around 02/28/2022) for Chronic medical conditions.       [name], MD, FAAFP. [facility], [place]   11/28/2021, 12:43 PM

## 2021-12-03 ENCOUNTER — Ambulatory Visit: Payer: Medicare Other | Admitting: Internal Medicine

## 2021-12-03 IMAGING — MG MM DIGITAL SCREENING BILAT W/ TOMO AND CAD
6 of 12 series · 6 of 36 positions shown · non-contrast
Comparison: Screening mammogram dated 05/09/2010.

ACR Breast Density Category a: The breast tissue is almost entirely
fatty.

CLINICAL DATA: Screening. Interval bilateral reduction
mammoplasties.

EXAM:
DIGITAL SCREENING BILATERAL MAMMOGRAM WITH TOMOSYNTHESIS AND CAD
TECHNIQUE: Bilateral screening digital craniocaudal and mediolateral oblique
mammograms were obtained. Bilateral screening digital breast
tomosynthesis was performed. The images were evaluated with
computer-aided detection.

[L CC synth-2D]
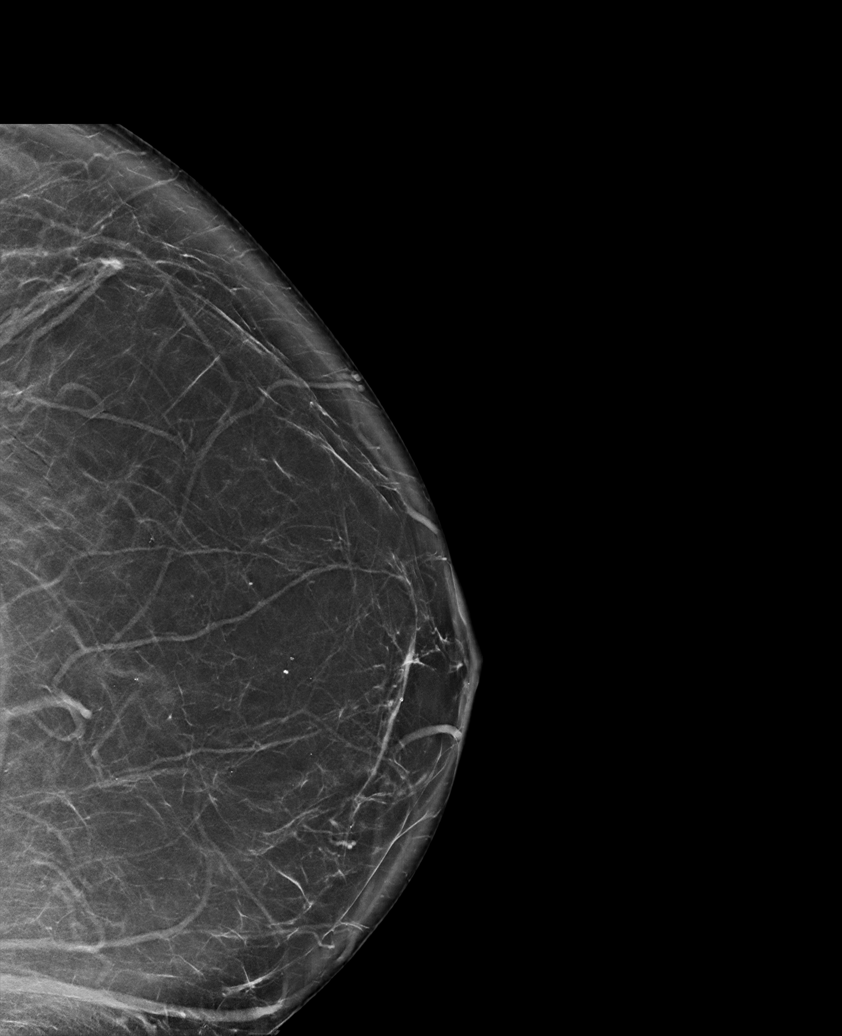

[R CV synth-2D]
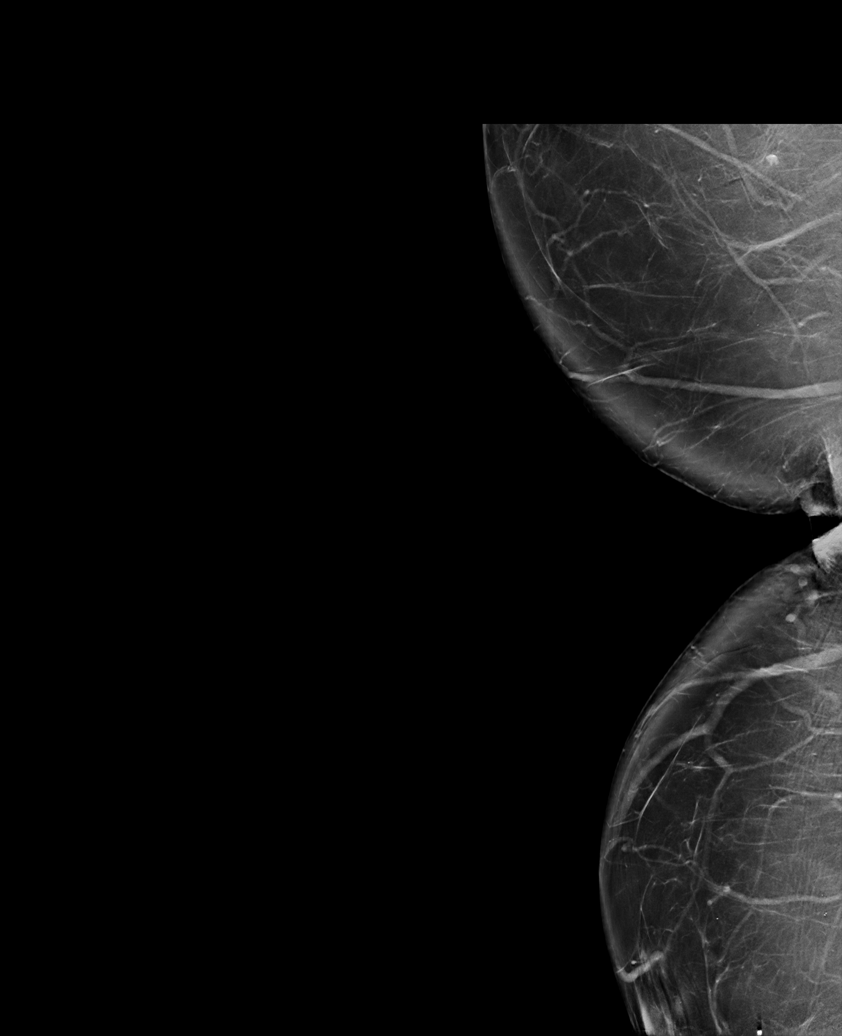

[R MLO synth-2D]
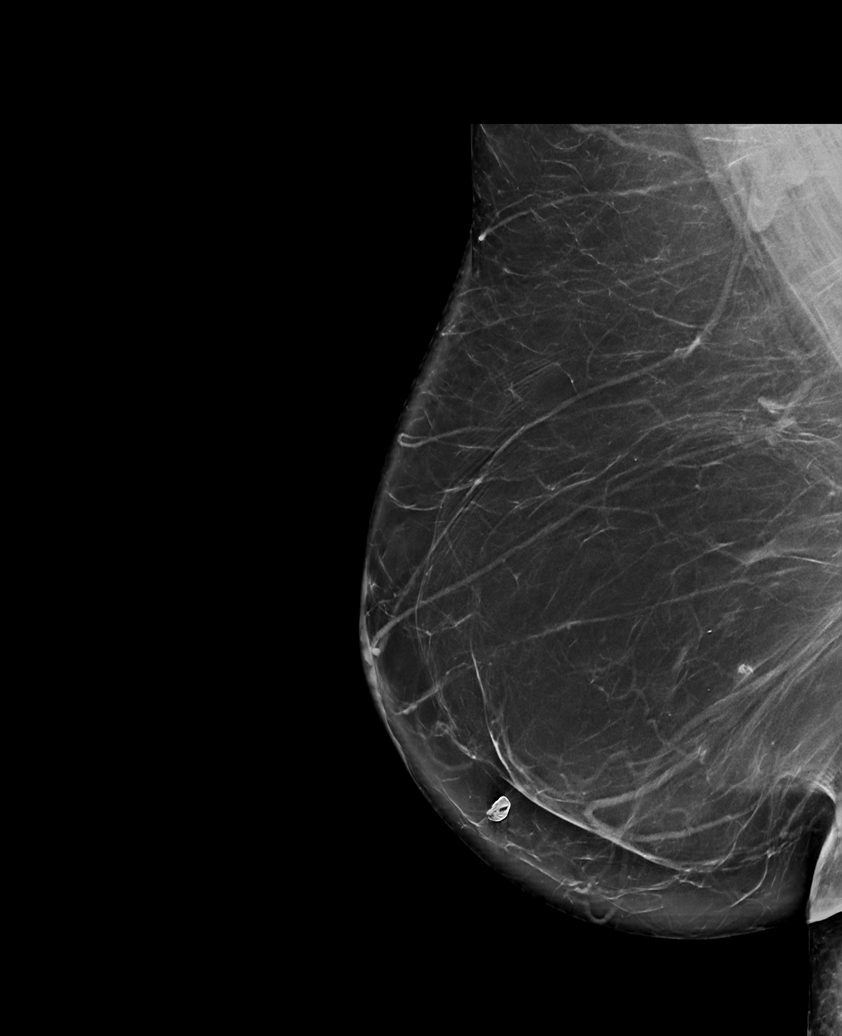

[L MLO synth-2D (1 of 2)]
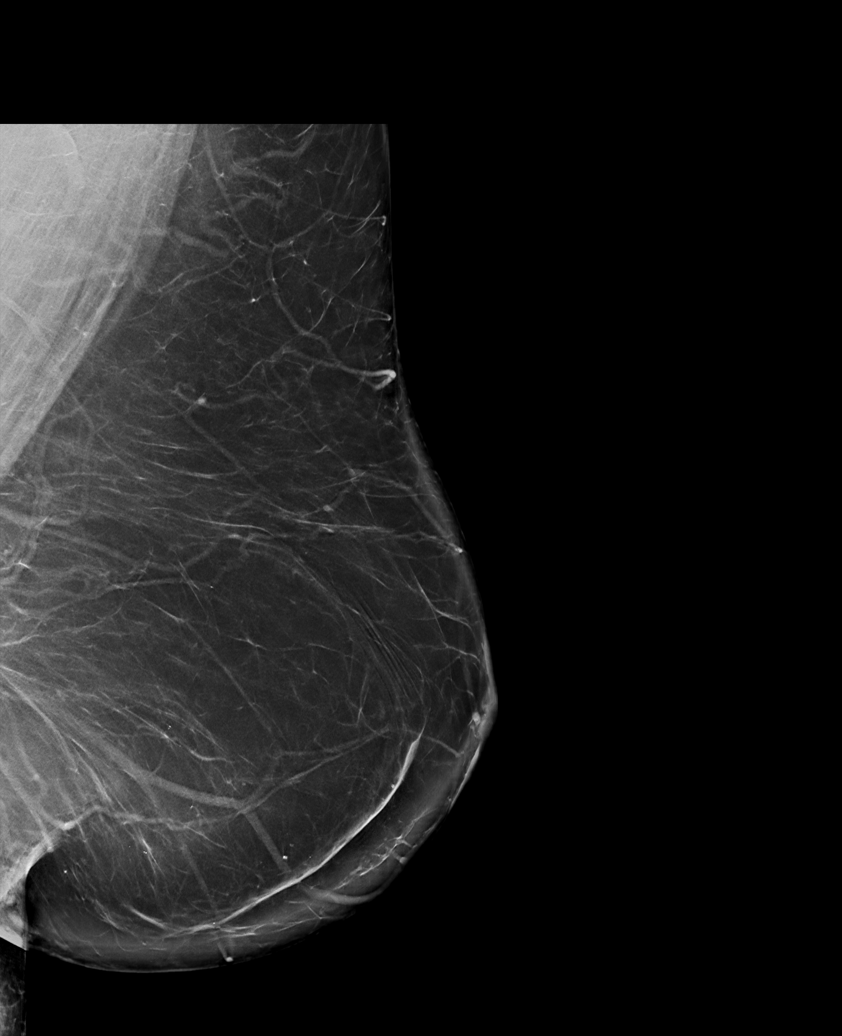

[R CC synth-2D]
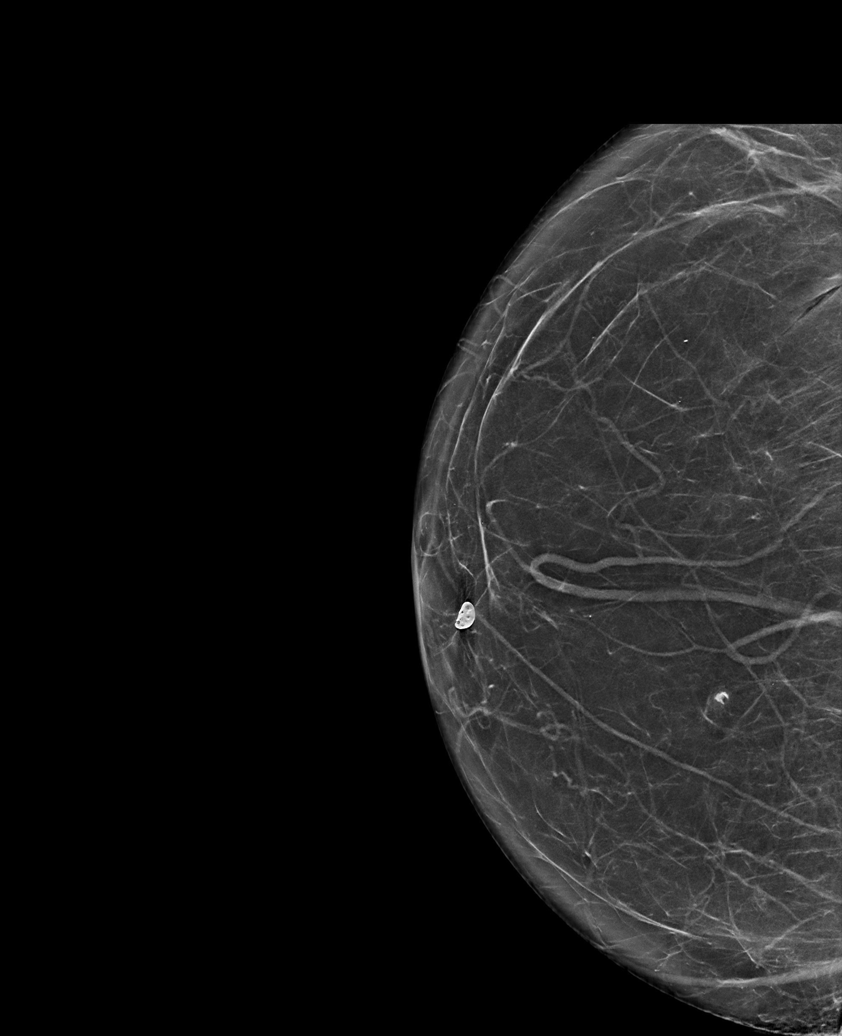

[L MLO synth-2D (2 of 2)]
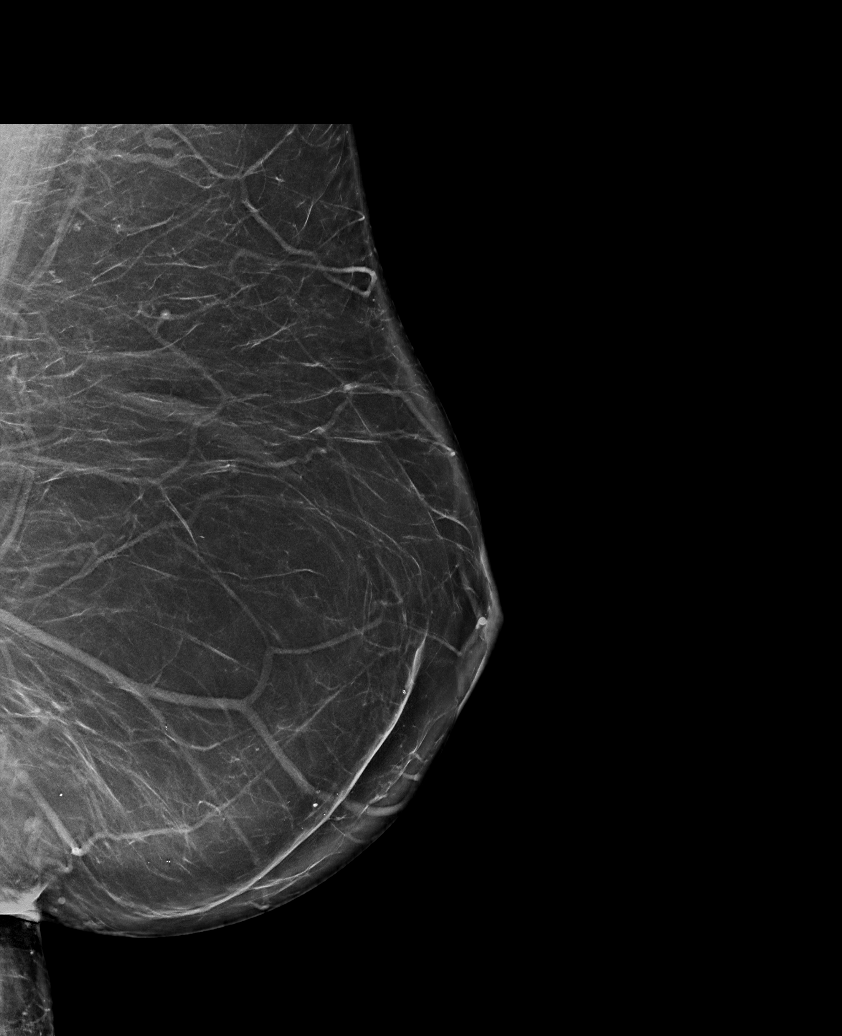

[6 of 36 positions shown; findings below may reference images not displayed]

FINDINGS: There are no findings suspicious for malignancy.
IMPRESSION: No mammographic evidence of malignancy. A result letter of this
screening mammogram will be mailed directly to the patient.

RECOMMENDATION:
Screening mammogram in one year. (Code:PY-B-WYY)

BI-RADS CATEGORY  1: Negative.

## 2021-12-03 NOTE — Therapy (Deleted)
OUTPATIENT PHYSICAL THERAPY TREATMENT NOTE   Patient Name: April [name] MRN: [id number] DOB:11/29/1970, 51 y.o., female Today's Date: 12/03/2021  PCP: [name], MD REFERRING PROVIDER: [name], MD  END OF SESSION:    Past Medical History:  Diagnosis Date   Acanthosis nigricans    Anxiety    Arthritis    "knees" (04/28/2017)   Asthma    Followed by Dr. Annamaria Boots (pulmonology); receives every other week omalizumab injections; has frequent exacerbations   Back pain    Chronic diastolic CHF (congestive heart failure) ([place]) 01/17/2017   COPD (chronic obstructive pulmonary disease) ([place])    PFTs in 2002, FEV1/FVC 65, no post bronchodilater test done   Depression    GERD (gastroesophageal reflux disease)    Headache(784.0)    "q couple days" (68/34/1962)   Helicobacter pylori (H. pylori) infection    Hypertension, essential    Insomnia    Joint pain    Lower extremity edema    Menorrhagia    Morbid obesity ([place])    OSA on CPAP    Sleep study 2008 - mild OSA, not enough events to titrate CPAP; wears CPAP now/pt on 04/28/2017   Pneumonia X 1   Prediabetes    Rheumatoid arthritis (HCC)    Seasonal allergies    Shortness of breath    Tobacco user    Vitamin D deficiency    Past Surgical History:  Procedure Laterality Date   CARDIOVERSION N/A 05/30/2017   Procedure: CARDIOVERSION;  Surgeon: Sanda Klein, MD;  Location: [facility];  Service: Cardiovascular;  Laterality: N/A;   REDUCTION MAMMAPLASTY Bilateral 09/2011   TUBAL LIGATION  1996   bilateral   Patient Active Problem List   Diagnosis Date Noted   Severe persistent asthma with acute exacerbation in adult 08/23/2021   Hypothyroidism 08/23/2021   Chronic heart failure with preserved ejection fraction (HFpEF) ([place]) 08/23/2021   Primary osteoarthritis of right knee 07/04/2021   Body mass index 50.0-59.9, adult ([place]) 07/04/2021   Painful orthopaedic hardware ([place]) 07/04/2021   COPD with exacerbation ([place])  02/04/2021   Severe persistent asthma 07/04/2020   Acute respiratory failure ([place]) 02/20/2019   CHF exacerbation ([place]) 02/20/2019   Hot flashes    Asthma-COPD overlap syndrome ([place]) 10/26/2018   Acute respiratory distress 08/03/2018   Acute exacerbation of chronic obstructive pulmonary disease (COPD) ([place]) 08/03/2018   Vitamin D deficiency 05/26/2018   Acute asthma exacerbation 05/16/2018   Urinary incontinence 11/17/2017   Nausea with vomiting 11/06/2017   Hypomagnesemia 10/27/2017   Dysphagia 10/27/2017   Physical deconditioning 10/25/2017   Acute maxillary sinusitis 10/24/2017   Emesis, persistent 10/08/2017   Volume depletion 10/08/2017   Chest pain 10/08/2017   SOB (shortness of breath) 10/08/2017   Debility 09/22/2017   AKI (acute kidney injury) ([place]) 08/18/2017   HCAP (healthcare-associated pneumonia) 08/06/2017   Chronic atrial fibrillation 07/29/2017   Acute respiratory failure with hypoxia ([place]) 07/07/2017   AF (paroxysmal atrial fibrillation) ([place])    Dyspnea 02/04/2017   Asthma exacerbation 22/97/9892   Metabolic syndrome 11/94/1740   Moderate persistent asthma with exacerbation 10/19/2016   Normocytic anemia 10/02/2016   Elevated hemoglobin A1c 05/05/2016   GERD (gastroesophageal reflux disease) 08/30/2015   Anxiety and depression 08/30/2015   Generalized anxiety disorder 08/30/2015   Hyperlipidemia 12/08/2013   Seasonal allergic rhinitis 08/29/2013   Leukocytosis 10/21/2012   Tobacco abuse in remission 10/07/2012   COPD mixed type ([place]) 05/07/2012   Knee pain, bilateral 04/25/2011   Insomnia 03/14/2011  Obstructive sleep apnea 12/19/2010   Hypokalemia 08/13/2010   Cervical back pain with evidence of disc disease 04/08/2008   HTN (hypertension) 07/31/2006   Morbid obesity due to excess calories ([place]) 06/17/2006   Major depressive disorder, recurrent episode ([place]) 04/10/2006    REFERRING DIAG: R53.81 (ICD-10-CM) - Physical deconditioning   THERAPY  DIAG: physical deconditioning   Rationale for Evaluation and Treatment Rehabilitation  PERTINENT HISTORY: 51 year old female recently admitted 10/08/21 with chest pain, SOB, asthma exacerbation.  Pt currently admitted 10/23/21 for Acute respiratory failure with hypoxia secondary to acute asthma exacerbation.  Pt with past medical history significant for asthma, COPD, A-fib on Eliquis, CHF, GERD, obesity, diabetes type 2 hypothyroidism.    PRECAUTIONS: None  SUBJECTIVE: ***  PAIN:  Are you having pain? {OPRCPAIN:[id number]}   OBJECTIVE: (objective measures completed at initial evaluation unless otherwise dated)  DIAGNOSTIC FINDINGS: one noted   COGNITION: Overall cognitive status: Within functional limits for tasks assessed             SENSATION: Not tested       MUSCLE TONE: WFL     MUSCLE LENGTH: Hamstrings: Right 70 deg; Left 70 deg Thomas test: Left neg     POSTURE: rounded shoulders and Increased valgus knees B   LOWER EXTREMITY ROM:   (B hip mobility limited by body habitus)   Active  Right Eval Left Eval  Hip flexion 90d 90d  Hip extension      Hip abduction      Hip adduction      Hip internal rotation      Hip external rotation      Knee flexion [facility] WFL  Knee extension 0d 0d  Ankle dorsiflexion      Ankle plantarflexion      Ankle inversion      Ankle eversion       (Blank rows = not tested)   LOWER EXTREMITY MMT:     MMT Right Eval Left Eval  Hip flexion 4 4  Hip extension 4 4  Hip abduction 4- 4-  Hip adduction      Hip internal rotation      Hip external rotation      Knee flexion 4 4  Knee extension 4- 4-  Ankle dorsiflexion      Ankle plantarflexion      Ankle inversion      Ankle eversion      (Blank rows = not tested)   BED MOBILITY:  Sit to supine Complete Independence   TRANSFERS: Mod I in transfers   STAIRS:           Level of Assistance: Modified independence           Stair Negotiation Technique: Step to Pattern with  Bilateral Rails           Number of Stairs: 2-3                       GAIT: Gait pattern: Wide BOS, decreased step length Distance walked: 57f x2 Assistive device utilized: None Level of assistance: Complete Independence Comments: slow cadence   FUNCTIONAL TESTs:  TBD   PATIENT SURVEYS:  FOTO program did not register response   TODAY'S TREATMENT:  Evaluation only     PATIENT EDUCATION: Education details: Discussed eval findings, rehab rationale and POC and patient is in agreement  Person educated: Patient Education method: Explanation Education comprehension: needs further education     HOME EXERCISE PROGRAM: TBD  GOALS: Goals reviewed with patient? No   SHORT TERM GOALS: STGs=LTGs     LONG TERM GOALS: Target date: 12/12/2021   Patient to demonstrate independence in HEP  Baseline: TBD Goal status: INITIAL   2.  Patient to demo 5x STS in 20s w/arms crossed Baseline: TBD Goal status: INITIAL   3.  Decrease worst pain to 6/10 Baseline: 10/10 Goal status: INITIAL   4.  Increase B hip abduction and knee extension strength to 4/5 Baseline: 4-/5 respectively Goal status: INITIAL       ASSESSMENT:   CLINICAL IMPRESSION: Patient is a 51 y.o. female who was seen today for physical therapy evaluation and treatment for physical deconditioning following recent hospital admission.  Patient describes LLE pain, present while in hospital but review of noted do not find symptoms addressed.  Patient cannot identify aggravating or relieving factors.  No hip imaging studies available.  Referral for deconditioning.  Assessment finds tenderness to L lateral thigh, iliac crest to lateral epicondyle, diffusely.  No neuro signs elicited, hip mobility restricted by body habitus.  SLR and hip flexor stretch unremarkable.       OBJECTIVE IMPAIRMENTS decreased activity tolerance, decreased endurance, decreased knowledge of condition, decreased mobility, difficulty walking,  decreased strength, improper body mechanics, postural dysfunction, obesity, and pain.    ACTIVITY LIMITATIONS sitting and stairs   PERSONAL FACTORS Fitness and 1-2 comorbidities: COPD, CHF  are also affecting patient's functional outcome.    REHAB POTENTIAL: Fair due to co-morbidities   CLINICAL DECISION MAKING: Evolving/moderate complexity   EVALUATION COMPLEXITY: Moderate   PLAN: PT FREQUENCY: 1x/week   PT DURATION: 4 weeks   PLANNED INTERVENTIONS: Therapeutic exercises, Therapeutic activity, Neuromuscular re-education, Balance training, Gait training, Patient/Family education, Joint mobilization, Stair training, and Re-evaluation   PLAN FOR NEXT SESSION: F/u with MD to address L hip pain, establish HEP based on MD findings, test 5x STS      Lanice Shirts, PT 12/03/2021, 3:23 PM

## 2021-12-05 ENCOUNTER — Ambulatory Visit: Payer: Medicare Other

## 2021-12-06 ENCOUNTER — Encounter: Payer: Self-pay | Admitting: Gastroenterology

## 2021-12-07 ENCOUNTER — Telehealth: Payer: Self-pay | Admitting: Emergency Medicine

## 2021-12-07 DIAGNOSIS — K062 Gingival and edentulous alveolar ridge lesions associated with trauma: Secondary | ICD-10-CM

## 2021-12-07 NOTE — Telephone Encounter (Signed)
Copied from [place] [phone]. Topic: General - Other >> Dec 06, 2021  4:53 PM Leilani Able wrote: [phone] [place], [place] wants a dental referral, told pt I was not aware of  any dental referrals but she wanted me to ask if you knew of any. I told pt that if you could help her in anyway I would ask you to reach out. She said even if you don't know do dental referrals that she wants a call back either way. [phone] She feels like if you call an office for her she will not have to wait.

## 2021-12-10 NOTE — Telephone Encounter (Signed)
I called patient and she is requesting a referral to dental. She stated that her current dentures are 49 years old, and she is in need of new set.  She also said that she has an appointment with GI on 01/08/2022 to discuss scheduling a colonoscopy and they told her that she will need to stop taking her eliquis prior to that procedure.  She then told me that she has not taken it in over a month.   When asked if they told her when to stop taking it she said they didn't say.  I told her that I would let Dr Margarita Rana know that she has not been taking it   She also explained that she has to re-qualify for Medicaid. She needs to have $7000 medical expenses over 2 years in order to re-qualify.  She said she has been in contact with DSS as well as [facility] but the hospital has not sent the copies of her record to DSS.  She plans to call DSS again today and ask them to request the medical bills.

## 2021-12-10 NOTE — Addendum Note (Signed)
Addended by: [name] on: 12/10/2021 04:59 PM   Modules accepted: Orders

## 2021-12-10 NOTE — Telephone Encounter (Signed)
Call placed to patient and informed her that Dr Margarita Rana  placed a dental referral. I also told her that Dr Margarita Rana said - with regards to Eliquis GI will typically reach out to me with the date of the procedure and we will provide recommendations.  She needs to take the Eliquis for now.  The patient said okay, she understood.

## 2021-12-10 NOTE — Telephone Encounter (Signed)
I have placed dental referral.  With regards to Eliquis GI will typically reach out to me with the date of the procedure and we will provide recommendations.  She needs to take the Eliquis for now.

## 2021-12-11 ENCOUNTER — Other Ambulatory Visit: Payer: Self-pay | Admitting: Family Medicine

## 2021-12-11 ENCOUNTER — Ambulatory Visit (HOSPITAL_COMMUNITY): Payer: Medicare Other | Admitting: Licensed Clinical Social Worker

## 2021-12-11 DIAGNOSIS — J4541 Moderate persistent asthma with (acute) exacerbation: Secondary | ICD-10-CM

## 2021-12-11 NOTE — Telephone Encounter (Signed)
Requested Prescriptions  Pending Prescriptions Disp Refills  . montelukast (SINGULAIR) 10 MG tablet [Pharmacy Med Name: MONTELUKAST '10MG'$  TABLETS] 90 tablet 0    Sig: TAKE 1 TABLET(10 MG) BY MOUTH AT BEDTIME     Pulmonology:  Leukotriene Inhibitors Passed - 12/11/2021  6:20 AM      Passed - Valid encounter within last 12 months    Recent Outpatient Visits          1 week ago Type 2 diabetes mellitus without complication, without long-term current use of insulin ([place])   [place], Charlane Ferretti, MD   6 months ago Screening for colon cancer   [place], [place], MD   9 months ago Moderate persistent asthma with acute exacerbation   [facility], Charlane Ferretti, MD   1 year ago Encounter for [employer] annual wellness exam   [place], [place]ong, MD   1 year ago Hot flashes   [place], Enobong, MD      Future Appointments            In 3 months [name], MD [place]

## 2021-12-18 ENCOUNTER — Other Ambulatory Visit (HOSPITAL_COMMUNITY): Payer: Self-pay

## 2021-12-19 ENCOUNTER — Other Ambulatory Visit (HOSPITAL_COMMUNITY): Payer: Self-pay

## 2021-12-20 ENCOUNTER — Other Ambulatory Visit (HOSPITAL_COMMUNITY): Payer: Self-pay

## 2021-12-20 ENCOUNTER — Telehealth: Payer: Self-pay

## 2021-12-20 NOTE — Telephone Encounter (Signed)
LVM to schedule AWV.

## 2021-12-27 ENCOUNTER — Telehealth: Payer: Self-pay | Admitting: Family Medicine

## 2021-12-27 ENCOUNTER — Telehealth: Payer: Self-pay

## 2021-12-27 NOTE — Telephone Encounter (Signed)
Documentation noted in another telephone encounter

## 2021-12-27 NOTE — Telephone Encounter (Signed)
The patient called stating she is interested in [facility]. She now has full Medicaid.  Dr Margarita Rana, a referral can be submitted if you are in agreement

## 2021-12-27 NOTE — Telephone Encounter (Signed)
Copied from [place] [id number]. Topic: General - Other >> Dec 27, 2021 12:38 PM [id number] J wrote: Reason for CRM: pt called in to request a call back from [place]. In the office. Pt wouldn't give details of the call. Pt declined need my assistance.   Phone: [phone]

## 2021-12-28 NOTE — Telephone Encounter (Signed)
Yes I am. We can proceed with completing the PCS form for her.  Thank you

## 2022-01-01 ENCOUNTER — Ambulatory Visit: Payer: Medicare Other | Admitting: Family Medicine

## 2022-01-01 ENCOUNTER — Encounter: Payer: Medicare Other | Admitting: Family Medicine

## 2022-01-02 ENCOUNTER — Telehealth: Payer: Self-pay | Admitting: Family Medicine

## 2022-01-02 NOTE — Telephone Encounter (Signed)
I called the patient back. She apologized that she missed her last appointment and said she has been re-scheduled for her physical next month.  She also inquired about PCS and I explained that once Dr Margarita Rana signs off on the referral I will send it to [employer].  They will then call her to schedule an in home assessment.

## 2022-01-02 NOTE — Telephone Encounter (Signed)
Copied from [place] [id number]. Topic: General - Other >> Jan 02, 2022 12:53 PM Oley Balm E wrote: Reason for CRM: Pt wants to speak to Opal Sidles, says its important but declined to disclose any further info  [phone]

## 2022-01-07 ENCOUNTER — Ambulatory Visit (HOSPITAL_COMMUNITY): Payer: Self-pay | Admitting: Psychiatry

## 2022-01-07 ENCOUNTER — Telehealth: Payer: Self-pay | Admitting: Adult Health

## 2022-01-07 ENCOUNTER — Other Ambulatory Visit: Payer: Self-pay

## 2022-01-07 ENCOUNTER — Inpatient Hospital Stay (HOSPITAL_COMMUNITY)
Admission: EM | Admit: 2022-01-07 | Discharge: 2022-01-13 | DRG: 202 | Disposition: A | Discharge status: Home Health | Payer: Medicare Other | Attending: Internal Medicine | Admitting: Internal Medicine

## 2022-01-07 ENCOUNTER — Emergency Department (HOSPITAL_COMMUNITY): Payer: Medicare Other

## 2022-01-07 ENCOUNTER — Encounter (HOSPITAL_COMMUNITY): Payer: Self-pay

## 2022-01-07 DIAGNOSIS — Z79890 Hormone replacement therapy (postmenopausal): Secondary | ICD-10-CM

## 2022-01-07 DIAGNOSIS — K219 Gastro-esophageal reflux disease without esophagitis: Secondary | ICD-10-CM | POA: Diagnosis present

## 2022-01-07 DIAGNOSIS — Z7951 Long term (current) use of inhaled steroids: Secondary | ICD-10-CM

## 2022-01-07 DIAGNOSIS — M1612 Unilateral primary osteoarthritis, left hip: Secondary | ICD-10-CM | POA: Diagnosis present

## 2022-01-07 DIAGNOSIS — R7303 Prediabetes: Secondary | ICD-10-CM | POA: Diagnosis present

## 2022-01-07 DIAGNOSIS — Z7901 Long term (current) use of anticoagulants: Secondary | ICD-10-CM

## 2022-01-07 DIAGNOSIS — I110 Hypertensive heart disease with heart failure: Secondary | ICD-10-CM | POA: Diagnosis present

## 2022-01-07 DIAGNOSIS — G894 Chronic pain syndrome: Secondary | ICD-10-CM | POA: Diagnosis present

## 2022-01-07 DIAGNOSIS — Z79899 Other long term (current) drug therapy: Secondary | ICD-10-CM

## 2022-01-07 DIAGNOSIS — Z20822 Contact with and (suspected) exposure to covid-19: Secondary | ICD-10-CM | POA: Diagnosis present

## 2022-01-07 DIAGNOSIS — Z6841 Body Mass Index (BMI) 40.0 and over, adult: Secondary | ICD-10-CM

## 2022-01-07 DIAGNOSIS — J441 Chronic obstructive pulmonary disease with (acute) exacerbation: Secondary | ICD-10-CM | POA: Diagnosis not present

## 2022-01-07 DIAGNOSIS — J45901 Unspecified asthma with (acute) exacerbation: Secondary | ICD-10-CM | POA: Diagnosis not present

## 2022-01-07 DIAGNOSIS — Z7985 Long-term (current) use of injectable non-insulin antidiabetic drugs: Secondary | ICD-10-CM

## 2022-01-07 DIAGNOSIS — I10 Essential (primary) hypertension: Secondary | ICD-10-CM | POA: Diagnosis present

## 2022-01-07 DIAGNOSIS — I5032 Chronic diastolic (congestive) heart failure: Secondary | ICD-10-CM | POA: Diagnosis present

## 2022-01-07 DIAGNOSIS — G4733 Obstructive sleep apnea (adult) (pediatric): Secondary | ICD-10-CM | POA: Diagnosis present

## 2022-01-07 DIAGNOSIS — E039 Hypothyroidism, unspecified: Secondary | ICD-10-CM | POA: Diagnosis present

## 2022-01-07 DIAGNOSIS — Z825 Family history of asthma and other chronic lower respiratory diseases: Secondary | ICD-10-CM

## 2022-01-07 DIAGNOSIS — Z91041 Radiographic dye allergy status: Secondary | ICD-10-CM

## 2022-01-07 DIAGNOSIS — I480 Paroxysmal atrial fibrillation: Secondary | ICD-10-CM | POA: Diagnosis present

## 2022-01-07 DIAGNOSIS — J9601 Acute respiratory failure with hypoxia: Secondary | ICD-10-CM | POA: Diagnosis present

## 2022-01-07 DIAGNOSIS — M069 Rheumatoid arthritis, unspecified: Secondary | ICD-10-CM | POA: Diagnosis present

## 2022-01-07 DIAGNOSIS — Z8249 Family history of ischemic heart disease and other diseases of the circulatory system: Secondary | ICD-10-CM

## 2022-01-07 DIAGNOSIS — Z87891 Personal history of nicotine dependence: Secondary | ICD-10-CM

## 2022-01-07 MED ORDER — METHYLPREDNISOLONE SODIUM SUCC 125 MG IJ SOLR
125.0000 mg | Freq: Once | INTRAMUSCULAR | Status: AC
  Administered 2022-01-08: 125 mg via INTRAVENOUS
  Filled 2022-01-07: qty 2

## 2022-01-07 MED ORDER — IPRATROPIUM-ALBUTEROL 0.5-2.5 (3) MG/3ML IN SOLN
3.0000 mL | Freq: Once | RESPIRATORY_TRACT | Status: AC
  Administered 2022-01-07: 3 mL via RESPIRATORY_TRACT
  Filled 2022-01-07: qty 9

## 2022-01-07 MED ORDER — PREDNISONE 10 MG PO TABS: ORAL_TABLET | ORAL | 0 refills | Status: DC

## 2022-01-07 NOTE — ED Triage Notes (Signed)
C/O substernal CP radiating to neck with non productive cough, headache x2 weeks. No relief with inhaler or nebs.  Hx COPD and CHF.

## 2022-01-07 NOTE — ED Provider Notes (Signed)
[facility] DEPT Provider Note   CSN: [id number] Arrival date & time: 01/07/22  2250     History {Add pertinent medical, surgical, social history, OB history to HPI:1} Chief Complaint  Patient presents with   Chest Pain    April [name] is a 51 y.o. female with a history of asthma, COPD, CHF with last EF***, OSA, prediabetes, RA, chronic atrial fibrillation on eliquis, and hypothyroidism who presents to the emergency department with complaints of shortness of breath for the past 2 weeks.  Patient reports shortness of breath, wheezing, intermittent chest discomfort, dry cough, and nasal congestion.  Symptoms seem to be progressively worsening.  No specific alleviating or aggravating factors.  She is utilizing her albuterol inhaler at home without much relief.  She wears 4 to 5 L of oxygen at night when sleeping but does not typically require oxygen during the day.  She denies fevers, ear pain, sore throat, hemoptysis, abdominal pain, vomiting, diarrhea, or leg swelling.  HPI     Home Medications Prior to Admission medications   Medication Sig Start Date End Date Taking? Authorizing Provider  albuterol (PROAIR HFA) 108 (90 Base) MCG/ACT inhaler Inhale 2 puffs into the lungs every 6 (six) hours as needed for wheezing or shortness of breath. 11/13/21   Baird Lyons D, MD  amLODipine (NORVASC) 10 MG tablet TAKE 1 TABLET(10 MG) BY MOUTH DAILY 08/27/21   [name], MD  amoxicillin-clavulanate (AUGMENTIN) 875-125 MG tablet Take 1 tablet by mouth every 12 (twelve) hours. 11/08/21   Lamptey, Myrene Galas, MD  apixaban (ELIQUIS) 5 MG TABS tablet Take 1 tablet (5 mg total) by mouth 2 (two) times daily. 11/28/21   [name], MD  arformoterol (BROVANA) 15 MCG/2ML NEBU Inhale 2 mLs (15 mcg total) by nebulization 2 (two) times daily. 10/26/21   Charlynne Cousins, MD  atorvastatin (LIPITOR) 20 MG tablet Take 1 tablet by mouth every morning. 10/26/21   Charlynne Cousins, MD  baclofen (LIORESAL) 20 MG tablet Take 20 mg by mouth daily as needed for muscle spasms (pain). 01/08/21   [provider]  carvedilol (COREG) 6.25 MG tablet Take 6.25 mg by mouth 2 (two) times daily. 12/06/21   [provider]  clonazePAM (KLONOPIN) 0.5 MG tablet Take 1 tablet (0.5 mg total) by mouth daily as needed for anxiety. 10/18/21   Florencia Reasons, MD  clonazePAM [facility] [place]) 0.5 MG tablet 1 or 2 for sleep as needed 11/01/21   Baird Lyons D, MD  cloNIDine (CATAPRES) 0.2 MG tablet Take 1 tablet (0.2 mg total) by mouth 2 (two) times daily. 11/14/21   [name], MD  COMBIVENT RESPIMAT 20-100 MCG/ACT AERS respimat SMARTSIG:1 Puff(s) By Mouth Every 6 Hours PRN 12/28/21   [provider]  DULoxetine (CYMBALTA) 60 MG capsule Take 1 capsule (60 mg total) by mouth daily. 06/06/21   [name], MD  EPINEPHrine 0.3 mg/0.3 mL IJ SOAJ injection Inject 0.3 mg into the muscle as needed for anaphylaxis. 05/30/20   Baird Lyons D, MD  fluticasone (FLONASE) 50 MCG/ACT nasal spray SHAKE LIQUID AND USE 1 SPRAY IN EACH NOSTRIL DAILY AS NEEDED FOR ALLERGIES OR RHINITIS Patient taking differently: Place 1 spray into both nostrils daily. 08/05/21   [name], MD  Fluticasone-Umeclidin-Vilant 100-62.5-25 MCG/ACT AEPB Inhale 1 puff into the lungs daily. 10/26/21   Charlynne Cousins, MD  furosemide (LASIX) 20 MG tablet Take 1 tablet (20 mg total) by mouth daily. 11/28/21   [name], MD  levothyroxine (SYNTHROID) 50 MCG tablet Take 1 tablet (50 mcg total) by mouth daily. 11/28/21   [name], MD  montelukast (SINGULAIR) 10 MG tablet TAKE 1 TABLET(10 MG) BY MOUTH AT BEDTIME 12/11/21   [name], MD  naloxone [facility]) nasal spray 4 mg/0.1 mL Place 1 spray into the nose once as needed (opioid overdose). 08/09/20   [provider]  omeprazole (PRILOSEC) 40 MG capsule Take 40 mg by mouth daily.    [provider]  oxyCODONE-acetaminophen (PERCOCET)  10-325 MG tablet Take 1 tablet by mouth 5 (five) times daily as needed (nerve pain). 08/01/21   [provider]  phentermine 15 MG capsule Take 15 mg by mouth daily. 08/10/21   [provider]  potassium chloride SA (KLOR-CON M) 20 MEQ tablet TAKE 1 TABLET(20 MEQ) BY MOUTH DAILY 10/25/21   [name], MD  predniSONE (DELTASONE) 10 MG tablet 4 tabs for 2 days, then 3 tabs for 2 days, 2 tabs for 2 days, then 1 tab for 2 days, then stop 01/07/22   Parrett, Tammy S, NP  pregabalin (LYRICA) 300 MG capsule Take 300 mg by mouth 2 (two) times daily. 01/08/21   [provider]  Semaglutide, 1 MG/DOSE, (OZEMPIC, 1 MG/DOSE,) 4 MG/3ML SOPN Inject 1 mg into the skin every Monday.    [provider]  Tezepelumab-ekko (TEZSPIRE) 210 MG/1.91ML SOAJ Inject 210 mg into the skin every 28 (twenty-eight) days. 11/05/21   Baird Lyons D, MD  topiramate (TOPAMAX) 200 MG tablet Take 200 mg by mouth at bedtime. 01/08/21   [provider]  Vitamin D, Ergocalciferol, (DRISDOL) 1.25 MG (50000 UNIT) CAPS capsule Take 50,000 Units by mouth once a week. 11/30/21   [provider]  zolpidem (AMBIEN) 10 MG tablet TAKE 1 TABLET BY MOUTH AT BEDTIME AS NEEDED FOR SLEEP Patient taking differently: Take 10 mg by mouth at bedtime. 07/25/21   Deneise Lever, MD  mometasone-formoterol (DULERA) 200-5 MCG/ACT AERO Inhale 2 puffs into the lungs 2 (two) times daily. Dx: Asthma 04/08/19 08/12/19  Deneise Lever, MD      Allergies    Contrast media [iodinated contrast media]    Review of Systems   Review of Systems  Constitutional:  Negative for fever.  HENT:  Positive for congestion. Negative for ear pain and sore throat.   Respiratory:  Positive for cough, chest tightness, shortness of breath and wheezing.   Cardiovascular:  Positive for chest pain. Negative for leg swelling.  Gastrointestinal:  Negative for abdominal pain, diarrhea, nausea and vomiting.  Neurological:  Negative for  syncope.  All other systems reviewed and are negative.   Physical Exam Updated Vital Signs BP 127/73   Pulse (!) 140   Temp 98.9 F (37.2 C) (Oral)   Resp (!) 21   Ht '5\' 6"'$  (1.676 m)   Wt (!) 153 kg   SpO2 (!) 78% Comment: PA at bedside  BMI 54.44 kg/m  Physical Exam Vitals and nursing note reviewed.  Constitutional:      General: She is in acute distress.     Appearance: She is well-developed. She is not toxic-appearing.  HENT:     Head: Normocephalic and atraumatic.  Eyes:     General:        Right eye: No discharge.        Left eye: No discharge.     Conjunctiva/sclera: Conjunctivae normal.  Cardiovascular:     Rate and Rhythm: Normal rate and regular rhythm.  Comments: HR in the 80s on monitor.  Pulmonary:     Effort: Tachypnea present.     Breath sounds: Wheezing present.     Comments: SpO2 in the 70s on RA, improved to 100% on 4L [place], transitioned to 2L via [place]  Abdominal:     General: There is no distension.     Palpations: Abdomen is soft.     Tenderness: There is no abdominal tenderness.  Musculoskeletal:     Cervical back: Neck supple.     Comments: No significant LE edema or calf tenderness.   Skin:    General: Skin is warm and dry.  Neurological:     Mental Status: She is alert.     Comments: Clear speech.   Psychiatric:        Behavior: Behavior normal.     ED Results / Procedures / Treatments   Labs (all labs ordered are listed, but only abnormal results are displayed) Labs Reviewed  RESP PANEL BY RT-PCR (FLU A&B, COVID) ARPGX2  CBC WITH DIFFERENTIAL/PLATELET  COMPREHENSIVE METABOLIC PANEL  BRAIN NATRIURETIC PEPTIDE  I-STAT BETA HCG BLOOD, ED ([facility], [facility], [facility] ONLY)  TROPONIN I (HIGH SENSITIVITY)    EKG None  Radiology No results found.  Procedures Procedures  {Document cardiac monitor, telemetry assessment procedure when appropriate:1}  Medications Ordered in ED Medications  methylPREDNISolone sodium succinate (SOLU-MEDROL) 125  mg/2 mL injection 125 mg (has no administration in time range)  ipratropium-albuterol (DUONEB) 0.5-2.5 (3) MG/3ML nebulizer solution 3 mL (has no administration in time range)  ipratropium-albuterol (DUONEB) 0.5-2.5 (3) MG/3ML nebulizer solution 3 mL (has no administration in time range)    ED Course/ Medical Decision Making/ A&P                           Medical Decision Making Amount and/or Complexity of Data Reviewed Labs: ordered. Radiology: ordered.  Risk Prescription drug management.  Patient presents to the ED with complaints of ***, this involves an extensive number of treatment options, and is a complaint that carries with it a high risk of complications and morbidity. Nontoxic, vitals ***.   Ddx including but not limited to: ***  Additional history obtained:  Chart/nursing notes reviewed***.  External records viewed including:  Echocardiogram 10/15/2021: EF of 60 to 65%, moderate LVH, grade 1 diastolic dysfunction.  EKG: ***  Lab Tests:  I viewed & interpreted labs including:  ***  Imaging Studies:  I ordered and viewed the following imaging, agree with radiologist impression:  ***  ED Course:  I ordered medications including *** for ***  ***: RE-EVAL: ***   Based on patient's chief complaint, I considered admission might be necessary, however after reassuring ED workup feel patient is reasonable for discharge.   Critical Interventions: ***  Social determinants: *** Social determinants of health: this may be used for patients who have homelessness, poor access to medical care, need assistance with transition of care for placement, medication or transportation or follow up. You may also use the section for patients who struggle with substance abuse, have dementia, nursing home patients, patients who are severely disabled or have some degree of mental retardation and are not able to fully participate in their care.   Portions of this note were generated with  [employer]. Dictation errors may occur despite best attempts at proofreading.   {Document critical care time when appropriate:1} {Document review of labs and clinical decision tools ie heart score, Chads2Vasc2 etc:1}  {  Document your independent review of radiology images, and any outside records:1} {Document your discussion with family members, caretakers, and with consultants:1} {Document social determinants of health affecting pt's care:1} {Document your decision making why or why not admission, treatments were needed:1} Final Clinical Impression(s) / ED Diagnoses Final diagnoses:  None    Rx / DC Orders ED Discharge Orders     None

## 2022-01-07 NOTE — Telephone Encounter (Signed)
Completed PCS referral faxed to [facility] °

## 2022-01-07 NOTE — Telephone Encounter (Signed)
Patient of Dr. Annamaria Boots    52 year old female Smoker followed for chronic severe asthma/Xolair, Tracheostomy at [facility] 2019, Insomnia,  complicated by noncompliance, OSA/ oral appliance, morbid obesity, HTN, depression, GERD Covid infection, PAFib, dCHF, Dyslipidemia,  Dupixent enrolled 10/22/2018 changed to [facility] 2023.  Prone to frequent hospitalizations-last hospitalized in June 2023  Complains of 2 weeks of cough and wheezing - requests prednisone taper.  Prednisone taper sent to the pharmacy.  Patient education on steroids given.  Patient is advised to see Dr. Annamaria Boots back in the office in the next couple weeks for a follow-up visit. Please contact office for sooner follow up if symptoms do not improve or worsen or seek emergency care   Denies DM.  Maintenance regimen: Trelegy daily , Singulair , Tezspire  , albuterol inhaler .     Nurse: Please call patient to make appointment with Dr. Annamaria Boots for follow-up.

## 2022-01-08 ENCOUNTER — Encounter (HOSPITAL_COMMUNITY): Payer: Self-pay | Admitting: Family Medicine

## 2022-01-08 ENCOUNTER — Ambulatory Visit: Payer: Medicare Other | Admitting: Gastroenterology

## 2022-01-08 DIAGNOSIS — J4551 Severe persistent asthma with (acute) exacerbation: Secondary | ICD-10-CM

## 2022-01-08 DIAGNOSIS — Z79890 Hormone replacement therapy (postmenopausal): Secondary | ICD-10-CM | POA: Diagnosis not present

## 2022-01-08 DIAGNOSIS — E039 Hypothyroidism, unspecified: Secondary | ICD-10-CM | POA: Diagnosis present

## 2022-01-08 DIAGNOSIS — Z7951 Long term (current) use of inhaled steroids: Secondary | ICD-10-CM | POA: Diagnosis not present

## 2022-01-08 DIAGNOSIS — I5032 Chronic diastolic (congestive) heart failure: Secondary | ICD-10-CM | POA: Diagnosis present

## 2022-01-08 DIAGNOSIS — M069 Rheumatoid arthritis, unspecified: Secondary | ICD-10-CM | POA: Diagnosis present

## 2022-01-08 DIAGNOSIS — I110 Hypertensive heart disease with heart failure: Secondary | ICD-10-CM | POA: Diagnosis present

## 2022-01-08 DIAGNOSIS — Z825 Family history of asthma and other chronic lower respiratory diseases: Secondary | ICD-10-CM | POA: Diagnosis not present

## 2022-01-08 DIAGNOSIS — R7303 Prediabetes: Secondary | ICD-10-CM | POA: Diagnosis present

## 2022-01-08 DIAGNOSIS — J9601 Acute respiratory failure with hypoxia: Secondary | ICD-10-CM | POA: Diagnosis present

## 2022-01-08 DIAGNOSIS — Z20822 Contact with and (suspected) exposure to covid-19: Secondary | ICD-10-CM | POA: Diagnosis present

## 2022-01-08 DIAGNOSIS — G4733 Obstructive sleep apnea (adult) (pediatric): Secondary | ICD-10-CM

## 2022-01-08 DIAGNOSIS — I10 Essential (primary) hypertension: Secondary | ICD-10-CM

## 2022-01-08 DIAGNOSIS — J441 Chronic obstructive pulmonary disease with (acute) exacerbation: Secondary | ICD-10-CM | POA: Diagnosis present

## 2022-01-08 DIAGNOSIS — G894 Chronic pain syndrome: Secondary | ICD-10-CM | POA: Insufficient documentation

## 2022-01-08 DIAGNOSIS — K219 Gastro-esophageal reflux disease without esophagitis: Secondary | ICD-10-CM | POA: Diagnosis present

## 2022-01-08 DIAGNOSIS — Z87891 Personal history of nicotine dependence: Secondary | ICD-10-CM | POA: Diagnosis not present

## 2022-01-08 DIAGNOSIS — Z6841 Body Mass Index (BMI) 40.0 and over, adult: Secondary | ICD-10-CM | POA: Diagnosis not present

## 2022-01-08 DIAGNOSIS — Z7985 Long-term (current) use of injectable non-insulin antidiabetic drugs: Secondary | ICD-10-CM | POA: Diagnosis not present

## 2022-01-08 DIAGNOSIS — I480 Paroxysmal atrial fibrillation: Secondary | ICD-10-CM

## 2022-01-08 DIAGNOSIS — J45901 Unspecified asthma with (acute) exacerbation: Secondary | ICD-10-CM | POA: Diagnosis present

## 2022-01-08 DIAGNOSIS — Z8249 Family history of ischemic heart disease and other diseases of the circulatory system: Secondary | ICD-10-CM | POA: Diagnosis not present

## 2022-01-08 DIAGNOSIS — Z91041 Radiographic dye allergy status: Secondary | ICD-10-CM | POA: Diagnosis not present

## 2022-01-08 DIAGNOSIS — Z79899 Other long term (current) drug therapy: Secondary | ICD-10-CM | POA: Diagnosis not present

## 2022-01-08 DIAGNOSIS — Z7901 Long term (current) use of anticoagulants: Secondary | ICD-10-CM | POA: Diagnosis not present

## 2022-01-08 LAB — I-STAT BETA HCG BLOOD, ED (MC, WL, AP ONLY): I-stat hCG, quantitative: <5 m[IU]/mL (ref <5)

## 2022-01-08 LAB — COMPREHENSIVE METABOLIC PANEL
ALT: 12 U/L (ref 0–44)
AST: 15 U/L (ref 15–41)
Albumin: 3.6 g/dL (ref 3.5–5.0)
Alkaline Phosphatase: 72 U/L (ref 38–126)
Anion gap: 5 (ref 5–15)
BUN: 10 mg/dL (ref 6–20)
CO2: 23 mmol/L (ref 22–32)
Calcium: 8.7 mg/dL — ABNORMAL LOW (ref 8.9–10.3)
Chloride: 110 mmol/L (ref 98–111)
Creatinine, Ser: 0.81 mg/dL (ref 0.44–1.00)
GFR, Estimated: >60 mL/min (ref >60)
Glucose, Bld: 75 mg/dL (ref 70–99)
Potassium: 4.5 mmol/L (ref 3.5–5.1)
Sodium: 138 mmol/L (ref 135–145)
Total Bilirubin: 0.6 mg/dL (ref 0.3–1.2)
Total Protein: 7.8 g/dL (ref 6.5–8.1)

## 2022-01-08 LAB — RESPIRATORY PANEL BY PCR

## 2022-01-08 LAB — CBC WITH DIFFERENTIAL/PLATELET
Abs Immature Granulocytes: 0.05 10*3/uL (ref 0.00–0.07)
Basophils Absolute: 0.1 10*3/uL (ref 0.0–0.1)
Basophils Relative: 1 %
Eosinophils Absolute: 0.6 10*3/uL — ABNORMAL HIGH (ref 0.0–0.5)
Eosinophils Relative: 4 %
HCT: 39.5 % (ref 36.0–46.0)
Hemoglobin: 12 g/dL (ref 12.0–15.0)
Immature Granulocytes: 0 %
Lymphocytes Relative: 40 %
Lymphs Abs: 5.4 10*3/uL — ABNORMAL HIGH (ref 0.7–4.0)
MCH: 25.6 pg — ABNORMAL LOW (ref 26.0–34.0)
MCHC: 30.4 g/dL (ref 30.0–36.0)
MCV: 84.2 fL (ref 80.0–100.0)
Monocytes Absolute: 1.3 10*3/uL — ABNORMAL HIGH (ref 0.1–1.0)
Monocytes Relative: 9 %
Neutro Abs: 6.1 10*3/uL (ref 1.7–7.7)
Neutrophils Relative %: 46 %
Platelets: 418 10*3/uL — ABNORMAL HIGH (ref 150–400)
RBC: 4.69 MIL/uL (ref 3.87–5.11)
RDW: 20 % — ABNORMAL HIGH (ref 11.5–15.5)
WBC: 13.5 10*3/uL — ABNORMAL HIGH (ref 4.0–10.5)
nRBC: 0 % (ref 0.0–0.2)

## 2022-01-08 LAB — BLOOD GAS, ARTERIAL
Acid-base deficit: 4 mmol/L — ABNORMAL HIGH (ref 0.0–2.0)
Bicarbonate: 23 mmol/L (ref 20.0–28.0)
Drawn by: 23532
O2 Content: 2 L/min
O2 Saturation: 54.5 %
Patient temperature: 36.8
pCO2 arterial: 49 mmHg — ABNORMAL HIGH (ref 32–48)
pH, Arterial: 7.28 — ABNORMAL LOW (ref 7.35–7.45)
pO2, Arterial: 33 mmHg — CL (ref 83–108)

## 2022-01-08 LAB — TROPONIN I (HIGH SENSITIVITY): Troponin I (High Sensitivity): 3 ng/L (ref <18)

## 2022-01-08 LAB — RESP PANEL BY RT-PCR (FLU A&B, COVID) ARPGX2
Influenza A by PCR: NEGATIVE
Influenza B by PCR: NEGATIVE
SARS Coronavirus 2 by RT PCR: NEGATIVE

## 2022-01-08 LAB — BRAIN NATRIURETIC PEPTIDE: B Natriuretic Peptide: 26.6 pg/mL (ref 0.0–100.0)

## 2022-01-08 MED ORDER — CARVEDILOL 6.25 MG PO TABS
6.2500 mg | ORAL_TABLET | Freq: Two times a day (BID) | ORAL | Status: DC
  Administered 2022-01-08 – 2022-01-13 (×11): 6.25 mg via ORAL
  Filled 2022-01-08 (×10): qty 1
  Filled 2022-01-08: qty 2

## 2022-01-08 MED ORDER — CLONAZEPAM 0.5 MG PO TABS: 0.5000 mg | ORAL_TABLET | Freq: Every day | ORAL | Status: DC | PRN

## 2022-01-08 MED ORDER — INFLUENZA VAC SPLIT QUAD 0.5 ML IM SUSY: 0.5000 mL | PREFILLED_SYRINGE | INTRAMUSCULAR | Status: DC

## 2022-01-08 MED ORDER — ALBUTEROL SULFATE (2.5 MG/3ML) 0.083% IN NEBU
2.5000 mg | INHALATION_SOLUTION | RESPIRATORY_TRACT | Status: DC | PRN
  Administered 2022-01-08 – 2022-01-13 (×8): 2.5 mg via RESPIRATORY_TRACT
  Filled 2022-01-08 (×9): qty 3

## 2022-01-08 MED ORDER — CLONIDINE HCL 0.1 MG PO TABS: 0.2000 mg | ORAL_TABLET | Freq: Two times a day (BID) | ORAL | Status: DC

## 2022-01-08 MED ORDER — DULOXETINE HCL 30 MG PO CPEP
60.0000 mg | ORAL_CAPSULE | Freq: Every day | ORAL | Status: DC
  Administered 2022-01-08 – 2022-01-13 (×6): 60 mg via ORAL
  Filled 2022-01-08 (×6): qty 2

## 2022-01-08 MED ORDER — POLYETHYLENE GLYCOL 3350 17 G PO PACK: 17.0000 g | PACK | Freq: Every day | ORAL | Status: DC | PRN

## 2022-01-08 MED ORDER — APIXABAN 5 MG PO TABS
5.0000 mg | ORAL_TABLET | Freq: Two times a day (BID) | ORAL | Status: DC
  Administered 2022-01-08 – 2022-01-13 (×11): 5 mg via ORAL
  Filled 2022-01-08 (×11): qty 1

## 2022-01-08 MED ORDER — METHYLPREDNISOLONE SODIUM SUCC 125 MG IJ SOLR
125.0000 mg | Freq: Two times a day (BID) | INTRAMUSCULAR | Status: DC
  Administered 2022-01-08 (×2): 125 mg via INTRAVENOUS
  Filled 2022-01-08 (×2): qty 2

## 2022-01-08 MED ORDER — TOPIRAMATE 100 MG PO TABS
200.0000 mg | ORAL_TABLET | Freq: Every day | ORAL | Status: DC
  Administered 2022-01-08 – 2022-01-12 (×5): 200 mg via ORAL
  Filled 2022-01-08 (×6): qty 2

## 2022-01-08 MED ORDER — BACLOFEN 20 MG PO TABS: 20.0000 mg | ORAL_TABLET | Freq: Every day | ORAL | Status: DC | PRN

## 2022-01-08 MED ORDER — FUROSEMIDE 20 MG PO TABS
20.0000 mg | ORAL_TABLET | Freq: Every day | ORAL | Status: DC
  Administered 2022-01-08 – 2022-01-13 (×6): 20 mg via ORAL
  Filled 2022-01-08 (×6): qty 1

## 2022-01-08 MED ORDER — ZOLPIDEM TARTRATE 5 MG PO TABS: 10.0000 mg | ORAL_TABLET | Freq: Every day | ORAL | Status: DC

## 2022-01-08 MED ORDER — IPRATROPIUM-ALBUTEROL 0.5-2.5 (3) MG/3ML IN SOLN
3.0000 mL | Freq: Four times a day (QID) | RESPIRATORY_TRACT | Status: DC | PRN
  Administered 2022-01-08 (×2): 3 mL via RESPIRATORY_TRACT
  Filled 2022-01-08 (×2): qty 3

## 2022-01-08 MED ORDER — OXYCODONE HCL 5 MG PO TABS
5.0000 mg | ORAL_TABLET | Freq: Every day | ORAL | Status: DC | PRN
  Administered 2022-01-08 – 2022-01-13 (×22): 5 mg via ORAL
  Filled 2022-01-08 (×22): qty 1

## 2022-01-08 MED ORDER — AMLODIPINE BESYLATE 10 MG PO TABS
10.0000 mg | ORAL_TABLET | Freq: Every day | ORAL | Status: DC
  Administered 2022-01-08 – 2022-01-13 (×6): 10 mg via ORAL
  Filled 2022-01-08: qty 1
  Filled 2022-01-08: qty 2
  Filled 2022-01-08 (×4): qty 1

## 2022-01-08 MED ORDER — CLONIDINE HCL 0.1 MG PO TABS
0.2000 mg | ORAL_TABLET | Freq: Two times a day (BID) | ORAL | Status: DC
  Administered 2022-01-08 – 2022-01-13 (×11): 0.2 mg via ORAL
  Filled 2022-01-08 (×11): qty 2

## 2022-01-08 MED ORDER — ZOLPIDEM TARTRATE 5 MG PO TABS
5.0000 mg | ORAL_TABLET | Freq: Every evening | ORAL | Status: DC | PRN
  Administered 2022-01-08 – 2022-01-12 (×5): 5 mg via ORAL
  Filled 2022-01-08 (×5): qty 1

## 2022-01-08 MED ORDER — LEVOTHYROXINE SODIUM 50 MCG PO TABS
50.0000 ug | ORAL_TABLET | Freq: Every day | ORAL | Status: DC
  Administered 2022-01-08 – 2022-01-13 (×6): 50 ug via ORAL
  Filled 2022-01-08 (×6): qty 1

## 2022-01-08 MED ORDER — MONTELUKAST SODIUM 10 MG PO TABS
10.0000 mg | ORAL_TABLET | Freq: Every day | ORAL | Status: DC
  Administered 2022-01-08 – 2022-01-12 (×5): 10 mg via ORAL
  Filled 2022-01-08 (×5): qty 1

## 2022-01-08 MED ORDER — ACETAMINOPHEN 650 MG RE SUPP: 650.0000 mg | Freq: Four times a day (QID) | RECTAL | Status: DC | PRN

## 2022-01-08 MED ORDER — PANTOPRAZOLE SODIUM 40 MG PO TBEC
40.0000 mg | DELAYED_RELEASE_TABLET | Freq: Every day | ORAL | Status: DC
  Administered 2022-01-08 – 2022-01-13 (×6): 40 mg via ORAL
  Filled 2022-01-08 (×6): qty 1

## 2022-01-08 MED ORDER — IPRATROPIUM-ALBUTEROL 0.5-2.5 (3) MG/3ML IN SOLN
3.0000 mL | Freq: Four times a day (QID) | RESPIRATORY_TRACT | Status: DC
  Administered 2022-01-09 – 2022-01-10 (×8): 3 mL via RESPIRATORY_TRACT
  Filled 2022-01-08 (×8): qty 3

## 2022-01-08 MED ORDER — ACETAMINOPHEN 325 MG PO TABS
650.0000 mg | ORAL_TABLET | Freq: Four times a day (QID) | ORAL | Status: DC | PRN
  Administered 2022-01-08 – 2022-01-10 (×4): 650 mg via ORAL
  Filled 2022-01-08 (×4): qty 2

## 2022-01-08 MED ORDER — BISACODYL 5 MG PO TBEC
5.0000 mg | DELAYED_RELEASE_TABLET | Freq: Every day | ORAL | Status: DC | PRN
  Administered 2022-01-13: 5 mg via ORAL
  Filled 2022-01-08: qty 1

## 2022-01-08 MED ORDER — OXYCODONE-ACETAMINOPHEN 10-325 MG PO TABS: 1.0000 | ORAL_TABLET | Freq: Every day | ORAL | Status: DC | PRN

## 2022-01-08 MED ORDER — OXYCODONE-ACETAMINOPHEN 5-325 MG PO TABS
1.0000 | ORAL_TABLET | Freq: Every day | ORAL | Status: DC | PRN
  Administered 2022-01-08 – 2022-01-13 (×22): 1 via ORAL
  Filled 2022-01-08 (×22): qty 1

## 2022-01-08 MED ORDER — MAGNESIUM SULFATE 2 GM/50ML IV SOLN
2.0000 g | Freq: Once | INTRAVENOUS | Status: AC
  Administered 2022-01-08: 2 g via INTRAVENOUS
  Filled 2022-01-08: qty 50

## 2022-01-08 MED ORDER — ALBUTEROL SULFATE (2.5 MG/3ML) 0.083% IN NEBU
10.0000 mg/h | INHALATION_SOLUTION | Freq: Once | RESPIRATORY_TRACT | Status: AC
  Administered 2022-01-08: 10 mg/h via RESPIRATORY_TRACT
  Filled 2022-01-08: qty 12

## 2022-01-08 MED ORDER — PREGABALIN 75 MG PO CAPS
300.0000 mg | ORAL_CAPSULE | Freq: Two times a day (BID) | ORAL | Status: DC
  Administered 2022-01-08 – 2022-01-13 (×11): 300 mg via ORAL
  Filled 2022-01-08 (×9): qty 4
  Filled 2022-01-08: qty 6
  Filled 2022-01-08: qty 4

## 2022-01-08 NOTE — ED Notes (Signed)
Ambulatory pulse oximetry  At rest pt 96% on room air Heart rate of 89 bpm Sitting on the side of the bed o2 sat 90% on room air HR 86 bpm Ambulating o2sat 86% on room air HR 115 bpm Return to rest spo2 recovered to 95% Heart rate returned to 89 bpm

## 2022-01-08 NOTE — Progress Notes (Signed)
PT refuses cpap states that she is fine using just 02.

## 2022-01-08 NOTE — H&P (Signed)
History and Physical    RAKEB KIBBLE CHE:[id number] DOB: [birth year] DOA: 01/07/2022  PCP: [name], MD   Patient coming from: Home   Chief Complaint: SOB, cough, wheeze, chest tightness  HPI: April [name] is a pleasant 51 y.o. female with medical history significant for hypertension, chronic diastolic CHF, atrial fibrillation on Eliquis, chronic pain, hypothyroidism, OSA, BMI 54, and asthma who presents to the emergency department with worsening shortness of breath, cough, and wheezing.  Patient reports that her symptoms began 2 weeks ago, have persisted despite using her breathing treatments at home, and then began to worsen significantly over the past day.  She denies any fever or chills and has not noticed any recent leg swelling.   ED Course: Upon arrival to the ED, patient is found to be afebrile and saturating 80s on room air with mild tachypnea, tachycardia, and elevated blood pressure.  EKG features sinus rhythm and chest x-ray negative for acute cardiopulmonary disease.  Chemistry panel unremarkable.  COVID and influenza PCR negative.  BNP and troponin are normal.  She was given 2 g of IV magnesium, continuous albuterol neb, 2 duo nebs, and 125 mg IV Solu-Medrol in the ED.  She was started on 2 L/min of supplemental oxygen.  Review of Systems:  All other systems reviewed and apart from HPI, are negative.  Past Medical History:  Diagnosis Date   Acanthosis nigricans    Anxiety    Arthritis    "knees" (04/28/2017)   Asthma    Followed by Dr. Annamaria Boots (pulmonology); receives every other week omalizumab injections; has frequent exacerbations   Back pain    Chronic diastolic CHF (congestive heart failure) ([place]) 01/17/2017   COPD (chronic obstructive pulmonary disease) ([facility])    PFTs in 2002, FEV1/FVC 65, no post bronchodilater test done   Depression    GERD (gastroesophageal reflux disease)    Headache(784.0)    "q couple days" (53/61/4431)   Helicobacter pylori (H. pylori)  infection    Hypertension, essential    Insomnia    Joint pain    Lower extremity edema    Menorrhagia    Morbid obesity ([facility])    OSA on CPAP    Sleep study 2008 - mild OSA, not enough events to titrate CPAP; wears CPAP now/pt on 04/28/2017   Pneumonia X 1   Prediabetes    Rheumatoid arthritis (HCC)    Seasonal allergies    Shortness of breath    Tobacco user    Vitamin D deficiency     Past Surgical History:  Procedure Laterality Date   CARDIOVERSION N/A 05/30/2017   Procedure: CARDIOVERSION;  Surgeon: Sanda Klein, MD;  Location: [facility];  Service: Cardiovascular;  Laterality: N/A;   REDUCTION MAMMAPLASTY Bilateral 09/2011   TUBAL LIGATION  1996   bilateral    Social History:   reports that she quit smoking about 7 years ago. Her smoking use included cigarettes. She has a 13.00 pack-year smoking history. She has never used smokeless tobacco. She reports that she does not drink alcohol and does not use drugs.  Allergies  Allergen Reactions   Contrast Media [Iodinated Contrast Media] Itching    CT contrast    Family History  Problem Relation Age of Onset   Hypertension Mother    Asthma Daughter    Cancer Paternal Aunt    Asthma Maternal Grandmother      Prior to Admission medications   Medication Sig Start Date End Date Taking? Authorizing Provider  albuterol (  PROAIR HFA) 108 (90 Base) MCG/ACT inhaler Inhale 2 puffs into the lungs every 6 (six) hours as needed for wheezing or shortness of breath. 11/13/21   Baird Lyons D, MD  amLODipine (NORVASC) 10 MG tablet TAKE 1 TABLET(10 MG) BY MOUTH DAILY Patient taking differently: Take 10 mg by mouth daily. 08/27/21   [name], MD  apixaban (ELIQUIS) 5 MG TABS tablet Take 1 tablet (5 mg total) by mouth 2 (two) times daily. 11/28/21   [name], MD  arformoterol (BROVANA) 15 MCG/2ML NEBU Inhale 2 mLs (15 mcg total) by nebulization 2 (two) times daily. 10/26/21   Charlynne Cousins, MD  atorvastatin  (LIPITOR) 20 MG tablet Take 1 tablet by mouth every morning. 10/26/21   Charlynne Cousins, MD  baclofen (LIORESAL) 20 MG tablet Take 20 mg by mouth daily as needed for muscle spasms (pain). 01/08/21   [provider]  carvedilol (COREG) 6.25 MG tablet Take 6.25 mg by mouth 2 (two) times daily. 12/06/21   [provider]  clonazePAM (KLONOPIN) 0.5 MG tablet Take 1 tablet (0.5 mg total) by mouth daily as needed for anxiety. 10/18/21   Florencia Reasons, MD  cloNIDine (CATAPRES) 0.2 MG tablet Take 1 tablet (0.2 mg total) by mouth 2 (two) times daily. 11/14/21   [name], MD  COMBIVENT RESPIMAT 20-100 MCG/ACT AERS respimat Inhale 1 puff into the lungs every 6 (six) hours as needed for wheezing or shortness of breath. 12/28/21   [provider]  DULoxetine (CYMBALTA) 60 MG capsule Take 1 capsule (60 mg total) by mouth daily. 06/06/21   [name], MD  EPINEPHrine 0.3 mg/0.3 mL IJ SOAJ injection Inject 0.3 mg into the muscle as needed for anaphylaxis. 05/30/20   Baird Lyons D, MD  fluticasone (FLONASE) 50 MCG/ACT nasal spray SHAKE LIQUID AND USE 1 SPRAY IN EACH NOSTRIL DAILY AS NEEDED FOR ALLERGIES OR RHINITIS Patient taking differently: Place 1 spray into both nostrils daily. 08/05/21   [name], MD  Fluticasone-Umeclidin-Vilant 100-62.5-25 MCG/ACT AEPB Inhale 1 puff into the lungs daily. 10/26/21   Charlynne Cousins, MD  furosemide (LASIX) 20 MG tablet Take 1 tablet (20 mg total) by mouth daily. 11/28/21   [name], MD  levothyroxine (SYNTHROID) 50 MCG tablet Take 1 tablet (50 mcg total) by mouth daily. 11/28/21   [name], MD  montelukast (SINGULAIR) 10 MG tablet TAKE 1 TABLET(10 MG) BY MOUTH AT BEDTIME Patient taking differently: Take 10 mg by mouth at bedtime. 12/11/21   [name], MD  naloxone [facility]) nasal spray 4 mg/0.1 mL Place 1 spray into the nose once as needed (opioid overdose). 08/09/20   [provider]  omeprazole (PRILOSEC) 40 MG  capsule Take 40 mg by mouth daily.    [provider]  oxyCODONE-acetaminophen (PERCOCET) 10-325 MG tablet Take 1 tablet by mouth 5 (five) times daily as needed (nerve pain). 08/01/21   [provider]  phentermine 15 MG capsule Take 15 mg by mouth daily. 08/10/21   [provider]  potassium chloride SA (KLOR-CON M) 20 MEQ tablet TAKE 1 TABLET(20 MEQ) BY MOUTH DAILY Patient taking differently: Take 20 mEq by mouth daily. 10/25/21   [name], MD  predniSONE (DELTASONE) 10 MG tablet 4 tabs for 2 days, then 3 tabs for 2 days, 2 tabs for 2 days, then 1 tab for 2 days, then stop 01/07/22   Parrett, Tammy S, NP  pregabalin (LYRICA) 300 MG capsule Take 300 mg by mouth 2 (two) times  daily. 01/08/21   [provider]  Semaglutide, 1 MG/DOSE, (OZEMPIC, 1 MG/DOSE,) 4 MG/3ML SOPN Inject 1 mg into the skin every Monday.    [provider]  Tezepelumab-ekko (TEZSPIRE) 210 MG/1.91ML SOAJ Inject 210 mg into the skin every 28 (twenty-eight) days. 11/05/21   Baird Lyons D, MD  topiramate (TOPAMAX) 200 MG tablet Take 200 mg by mouth at bedtime. 01/08/21   [provider]  Vitamin D, Ergocalciferol, (DRISDOL) 1.25 MG (50000 UNIT) CAPS capsule Take 50,000 Units by mouth once a week. 11/30/21   [provider]  zolpidem (AMBIEN) 10 MG tablet TAKE 1 TABLET BY MOUTH AT BEDTIME AS NEEDED FOR SLEEP Patient taking differently: Take 10 mg by mouth at bedtime. 07/25/21   Deneise Lever, MD  mometasone-formoterol (DULERA) 200-5 MCG/ACT AERO Inhale 2 puffs into the lungs 2 (two) times daily. Dx: Asthma 04/08/19 08/12/19  Deneise Lever, MD    Physical Exam: Vitals:   01/08/22 0230 01/08/22 0245 01/08/22 0342 01/08/22 0456  BP: (!) 150/71 (!) 171/107  137/67  Pulse: 88 87    Resp: 20     Temp:   98.2 F (36.8 C)   TempSrc:      SpO2: 99% 99%    Weight:      Height:        Constitutional: NAD, calm  Eyes: PERTLA, lids and conjunctivae normal ENMT:  Mucous membranes are moist. Posterior pharynx clear of any exudate or lesions.   Neck: supple, no masses  Respiratory: prolonged expiratory phase with end-expiratory wheezes. Labored respirations.  Cardiovascular: S1 & S2 heard, regular rate and rhythm. No extremity edema.   Abdomen: No distension, no tenderness, soft. Bowel sounds active.  Musculoskeletal: no clubbing / cyanosis. No joint deformity upper and lower extremities.   Skin: no significant rashes, lesions, ulcers. Warm, dry, well-perfused. Neurologic: CN 2-12 grossly intact. Moving all extremities. Alert and oriented.  Psychiatric: Pleasant. Cooperative.    Labs and Imaging on Admission: I have personally reviewed following labs and imaging studies  CBC: Recent Labs  Lab 01/07/22 2342  WBC 13.5*  NEUTROABS 6.1  HGB 12.0  HCT 39.5  MCV 84.2  PLT 035*   Basic Metabolic Panel: Recent Labs  Lab 01/07/22 2342  NA 138  K 4.5  CL 110  CO2 23  GLUCOSE 75  BUN 10  CREATININE 0.81  CALCIUM 8.7*   GFR: Estimated Creatinine Clearance: 128.4 mL/min (by C-G formula based on SCr of 0.81 mg/dL). Liver Function Tests: Recent Labs  Lab 01/07/22 2342  AST 15  ALT 12  ALKPHOS 72  BILITOT 0.6  PROT 7.8  ALBUMIN 3.6   No results for input(s): "LIPASE", "AMYLASE" in the last 168 hours. No results for input(s): "AMMONIA" in the last 168 hours. Coagulation Profile: No results for input(s): "INR", "PROTIME" in the last 168 hours. Cardiac Enzymes: No results for input(s): "CKTOTAL", "CKMB", "CKMBINDEX", "TROPONINI" in the last 168 hours. BNP (last 3 results) No results for input(s): "PROBNP" in the last 8760 hours. HbA1C: No results for input(s): "HGBA1C" in the last 72 hours. CBG: No results for input(s): "GLUCAP" in the last 168 hours. Lipid Profile: No results for input(s): "CHOL", "HDL", "LDLCALC", "TRIG", "CHOLHDL", "LDLDIRECT" in the last 72 hours. Thyroid Function Tests: No results for input(s): "TSH",  "T4TOTAL", "FREET4", "T3FREE", "THYROIDAB" in the last 72 hours. Anemia Panel: No results for input(s): "VITAMINB12", "FOLATE", "FERRITIN", "TIBC", "IRON", "RETICCTPCT" in the last 72 hours. Urine analysis:    Component Value  Date/Time   COLORURINE YELLOW 10/23/2021 1826   APPEARANCEUR CLOUDY (A) 10/23/2021 1826   LABSPEC 1.027 10/23/2021 1826   PHURINE 5.0 10/23/2021 1826   GLUCOSEU NEGATIVE 10/23/2021 1826   GLUCOSEU NEG mg/dL 10/28/2007 2049   HGBUR NEGATIVE 10/23/2021 1826   BILIRUBINUR NEGATIVE 10/23/2021 1826   KETONESUR NEGATIVE 10/23/2021 1826   PROTEINUR NEGATIVE 10/23/2021 1826   UROBILINOGEN 1.0 11/21/2014 0707   NITRITE NEGATIVE 10/23/2021 1826   LEUKOCYTESUR LARGE (A) 10/23/2021 1826   Sepsis Labs: '@LABRCNTIP'$ (procalcitonin:4,lacticidven:4) ) Recent Results (from the past 240 hour(s))  Resp Panel by RT-PCR (Flu A&B, Covid) Anterior Nasal Swab     Status: None   Collection Time: 01/08/22 12:11 AM   Specimen: Anterior Nasal Swab  Result Value Ref Range Status   SARS Coronavirus 2 by RT PCR NEGATIVE NEGATIVE Final    Comment: (NOTE) SARS-CoV-2 target nucleic acids are NOT DETECTED.  The SARS-CoV-2 RNA is generally detectable in upper respiratory specimens during the acute phase of infection. The lowest concentration of SARS-CoV-2 viral copies this assay can detect is 138 copies/mL. A negative result does not preclude SARS-Cov-2 infection and should not be used as the sole basis for treatment or other patient management decisions. A negative result may occur with  improper specimen collection/handling, submission of specimen other than nasopharyngeal swab, presence of viral mutation(s) within the areas targeted by this assay, and inadequate number of viral copies(<138 copies/mL). A negative result must be combined with clinical observations, patient history, and epidemiological information. The expected result is Negative.  Fact Sheet for Patients:   EntrepreneurPulse.com.au  Fact Sheet for Healthcare Providers:  IncredibleEmployment.be  This test is no t yet approved or cleared by the [place] FDA and  has been authorized for detection and/or diagnosis of SARS-CoV-2 by FDA under an Emergency Use Authorization (EUA). This EUA will remain  in effect (meaning this test can be used) for the duration of the COVID-19 declaration under Section 564(b)(1) of the Act, 21 U.S.C.section 360bbb-3(b)(1), unless the authorization is terminated  or revoked sooner.       Influenza A by PCR NEGATIVE NEGATIVE Final   Influenza B by PCR NEGATIVE NEGATIVE Final    Comment: (NOTE) The Xpert Xpress SARS-CoV-2/FLU/RSV plus assay is intended as an aid in the diagnosis of influenza from Nasopharyngeal swab specimens and should not be used as a sole basis for treatment. Nasal washings and aspirates are unacceptable for Xpert Xpress SARS-CoV-2/FLU/RSV testing.  Fact Sheet for Patients: EntrepreneurPulse.com.au  Fact Sheet for Healthcare Providers: IncredibleEmployment.be  This test is not yet approved or cleared by the [place] FDA and has been authorized for detection and/or diagnosis of SARS-CoV-2 by FDA under an Emergency Use Authorization (EUA). This EUA will remain in effect (meaning this test can be used) for the duration of the COVID-19 declaration under Section 564(b)(1) of the Act, 21 U.S.C. section 360bbb-3(b)(1), unless the authorization is terminated or revoked.  Performed at [facility], [place] [address], [place], [place] [address]      Radiological Exams on Admission: DG Chest Port 1 View  Result Date: 01/08/2022 CLINICAL DATA:  Dyspnea, shortness of breath EXAM: PORTABLE CHEST 1 VIEW COMPARISON:  10/23/2021 FINDINGS: The heart size and mediastinal contours are within normal limits. Both lungs are clear. The visualized skeletal  structures are unremarkable. IMPRESSION: No active disease. Electronically Signed   By: Donavan Foil M.D.   On: 01/08/2022 00:00    EKG: Independently reviewed. Sinus rhythm.   Assessment/Plan  1. Asthma exacerbation; acute hypoxic respiratory failure  - Presents with progressive SOB, non-productive cough, and wheezing and found to hypoxic  - Improved with treatments in ED but still dyspneic at rest and hypoxic with exertion  - Given IV magnesium, albuterol, Atrovent, and IV Solu-Medrol in ED   - Continue albuterol, systemic steroids, and as-needed supplemental O2    2. Chronic diastolic CHF  - Appears compensated  - Continue Lasix and Coreg    3. Atrial fibrillation  - In SR on admission  - Continue Eliquis and Coreg    4. Hypertension  - Continue Norvasc, Coreg, clonidine    5. Chronic pain  - Prescription database reviewed  - Continue home regimen   6. OSA  - CPAP qHS    DVT prophylaxis: Eliquis  Code Status: Full  Level of Care: Level of care: Med-Surg Family Communication: none present  Disposition Plan:  Patient is from: home  Anticipated d/c is to: Home  Anticipated d/c date is: Possibly as early as 01/09/22  Patient currently: Pending improved respiratory status  Consults called: none  Admission status: Observation     [name], MD [facility]  01/08/2022, 5:17 AM

## 2022-01-08 NOTE — ED Notes (Signed)
Arterial po2 33

## 2022-01-08 NOTE — Progress Notes (Signed)
Patient seen by Dr Myna Hidalgo this am.  Please see note for details.    April [name] is a pleasant 51 y.o. female with medical history significant for hypertension, chronic diastolic CHF, atrial fibrillation on Eliquis, chronic pain, hypothyroidism, OSA, BMI 54, and asthma who presents to the emergency department with worsening shortness of breath, cough, and wheezing.   She was admitted for acute respiratory failure with hypoxia from acute asthma exacerbation.  She was started on IV steroids and duonebs.  On exam this morning she was having exp wheezing bilaterally. RVP added.  Continue to monitor.    Hosie Poisson, MD

## 2022-01-09 ENCOUNTER — Inpatient Hospital Stay (HOSPITAL_COMMUNITY): Payer: Medicare Other

## 2022-01-09 DIAGNOSIS — J4551 Severe persistent asthma with (acute) exacerbation: Secondary | ICD-10-CM | POA: Diagnosis not present

## 2022-01-09 DIAGNOSIS — I5032 Chronic diastolic (congestive) heart failure: Secondary | ICD-10-CM | POA: Diagnosis not present

## 2022-01-09 DIAGNOSIS — J9601 Acute respiratory failure with hypoxia: Secondary | ICD-10-CM | POA: Diagnosis not present

## 2022-01-09 DIAGNOSIS — I480 Paroxysmal atrial fibrillation: Secondary | ICD-10-CM | POA: Diagnosis not present

## 2022-01-09 LAB — MAGNESIUM: Magnesium: 2.4 mg/dL (ref 1.7–2.4)

## 2022-01-09 LAB — BASIC METABOLIC PANEL
Anion gap: 7 (ref 5–15)
BUN: 11 mg/dL (ref 6–20)
CO2: 21 mmol/L — ABNORMAL LOW (ref 22–32)
Calcium: 8.9 mg/dL (ref 8.9–10.3)
Chloride: 109 mmol/L (ref 98–111)
Creatinine, Ser: 0.85 mg/dL (ref 0.44–1.00)
GFR, Estimated: >60 mL/min (ref >60)
Glucose, Bld: 166 mg/dL — ABNORMAL HIGH (ref 70–99)
Potassium: 4.2 mmol/L (ref 3.5–5.1)
Sodium: 137 mmol/L (ref 135–145)

## 2022-01-09 LAB — CBC
HCT: 36.9 % (ref 36.0–46.0)
Hemoglobin: 11.2 g/dL — ABNORMAL LOW (ref 12.0–15.0)
MCH: 25.1 pg — ABNORMAL LOW (ref 26.0–34.0)
MCHC: 30.4 g/dL (ref 30.0–36.0)
MCV: 82.6 fL (ref 80.0–100.0)
Platelets: 426 10*3/uL — ABNORMAL HIGH (ref 150–400)
RBC: 4.47 MIL/uL (ref 3.87–5.11)
RDW: 19.9 % — ABNORMAL HIGH (ref 11.5–15.5)
WBC: 16 10*3/uL — ABNORMAL HIGH (ref 4.0–10.5)
nRBC: 0 % (ref 0.0–0.2)

## 2022-01-09 MED ORDER — METHYLPREDNISOLONE SODIUM SUCC 40 MG IJ SOLR
40.0000 mg | Freq: Two times a day (BID) | INTRAMUSCULAR | Status: DC
  Administered 2022-01-09 – 2022-01-10 (×3): 40 mg via INTRAVENOUS
  Filled 2022-01-09 (×3): qty 1

## 2022-01-09 MED ORDER — ALUM & MAG HYDROXIDE-SIMETH 200-200-20 MG/5ML PO SUSP
30.0000 mL | ORAL | Status: DC | PRN
  Administered 2022-01-09 – 2022-01-13 (×5): 30 mL via ORAL
  Filled 2022-01-09 (×5): qty 30

## 2022-01-09 NOTE — Evaluation (Signed)
Physical Therapy Evaluation Patient Details Name: April [name] MRN: [id number] DOB: June 11, 1970 Today's Date: 01/09/2022  History of Present Illness  51 y.o. female with medical history significant for hypertension, chronic diastolic CHF, atrial fibrillation on Eliquis, chronic pain, hypothyroidism, OSA, BMI 54, and asthma who presents to the emergency department with worsening shortness of breath, cough, and wheezing. Dx of asthma exacerbation.  Clinical Impression  Pt admitted with above diagnosis. Pt ambulated 12' x 2 with seated rest, distance limited by 4/4 dyspnea and wheezing. Pulse oximeter had a poor signal during ambulation, SaO2 94% on 4L O2 after 3 minutes seated rest after ambulation. Pt demonstrates a significant decline in activity tolerance due to dyspnea.  Pt currently with functional limitations due to the deficits listed below (see PT Problem List). Pt will benefit from skilled PT to increase their independence and safety with mobility to allow discharge to the venue listed below.          Recommendations for follow up therapy are one component of a multi-disciplinary discharge planning process, led by the attending physician.  Recommendations may be updated based on patient status, additional functional criteria and insurance authorization.  Follow Up Recommendations Home health PT      Assistance Recommended at Discharge Set up Supervision/Assistance  Patient can return home with the following  Assistance with cooking/housework;Help with stairs or ramp for entrance    Equipment Recommendations None recommended by PT  Recommendations for Other Services       Functional Status Assessment Patient has had a recent decline in their functional status and demonstrates the ability to make significant improvements in function in a reasonable and predictable amount of time.     Precautions / Restrictions Precautions Precautions: Fall Precaution Comments: monitor  O2 Restrictions Weight Bearing Restrictions: No      Mobility  Bed Mobility Overal bed mobility: Modified Independent             General bed mobility comments: HOB up, used rail    Transfers Overall transfer level: Needs assistance Equipment used: Rolling walker (2 wheels) Transfers: Sit to/from Stand Sit to Stand: Supervision           General transfer comment: VCs hand placement    Ambulation/Gait   Gait Distance (Feet): 12 Feet Assistive device: Rolling walker (2 wheels) Gait Pattern/deviations: Step-through pattern, Decreased stride length       General Gait Details: 12' x 2 with RW, bed to bathroom, then to recliner. Distance limited by 4/4 dyspnea and pronounced wheezing. Pulse ox had poor signal while ambulating. SaO2 94% on 4L O2 after 3 minutes seated rest following ambulation.  Stairs            Wheelchair Mobility    Modified Rankin (Stroke Patients Only)       Balance Overall balance assessment: Modified Independent                                           Pertinent Vitals/Pain Pain Assessment Pain Assessment: 0-10 Pain Score: 7  Pain Location: L hip Pain Descriptors / Indicators: Sore Pain Intervention(s): Limited activity within patient's tolerance, Monitored during session, Premedicated before session, Repositioned    Home Living Family/patient expects to be discharged to:: Private residence   Available Help at Discharge: Friend(s);Available PRN/intermittently   Home Access: Stairs to enter   Entrance Stairs-Number of Steps: 2-3  Home Layout: One level Home Equipment: Conservation officer, nature (2 wheels);Wheelchair - manual Additional Comments: w/c doesn't fit "in areas". Pt somewhat vague about living situation, stated she "stays with someone" but is trying to get her own place    Prior Function Prior Level of Function : Independent/Modified Independent;Driving             Mobility Comments: walked  without AD, denies falls in past 6 months ADLs Comments: independent     Hand Dominance        Extremity/Trunk Assessment   Upper Extremity Assessment Upper Extremity Assessment: Overall WFL for tasks assessed    Lower Extremity Assessment Lower Extremity Assessment: Overall WFL for tasks assessed    Cervical / Trunk Assessment Cervical / Trunk Assessment: Normal  Communication   Communication: No difficulties  Cognition Arousal/Alertness: Awake/alert Behavior During Therapy: WFL for tasks assessed/performed Overall Cognitive Status: Within Functional Limits for tasks assessed                                          General Comments      Exercises     Assessment/Plan    PT Assessment Patient needs continued PT services  PT Problem List Cardiopulmonary status limiting activity;Decreased activity tolerance       PT Treatment Interventions Gait training;Therapeutic exercise    PT Goals (Current goals can be found in the Care Plan section)  Acute Rehab PT Goals Patient Stated Goal: be home for delivery of her 7th grandchild in few days PT Goal Formulation: With patient Time For Goal Achievement: 01/23/22 Potential to Achieve Goals: Good    Frequency Min 3X/week     Co-evaluation               AM-PAC PT "6 Clicks" Mobility  Outcome Measure Help needed turning from your back to your side while in a flat bed without using bedrails?: A Little Help needed moving from lying on your back to sitting on the side of a flat bed without using bedrails?: A Little Help needed moving to and from a bed to a chair (including a wheelchair)?: A Little Help needed standing up from a chair using your arms (e.g., wheelchair or bedside chair)?: None Help needed to walk in hospital room?: A Little Help needed climbing 3-5 steps with a railing? : A Little 6 Click Score: 19    End of Session Equipment Utilized During Treatment: Oxygen Activity Tolerance:  Patient limited by fatigue;Treatment limited secondary to medical complications (Comment) (dyspnea) Patient left: in bed;with call bell/phone within reach Nurse Communication: Mobility status PT Visit Diagnosis: Difficulty in walking, not elsewhere classified (R26.2)    Time: 4503-8882 PT Time Calculation (min) (ACUTE ONLY): 21 min   Charges:   PT Evaluation $PT Eval Moderate Complexity: 1 Mod          [name] PT 01/09/2022  Acute Rehabilitation Services  Office [phone]

## 2022-01-09 NOTE — Progress Notes (Signed)
Chaplain received a consult that April [name] was requesting prayer.  Chaplain spoke with [facility] and provided emotional and spiritual support as well as prayer.  Chaplain will follow up with April [name] tomorrown.

## 2022-01-09 NOTE — Progress Notes (Signed)
Mobility Specialist Cancellation/Refusal Note:    01/09/22 1509  Mobility  Activity Refused mobility     Reason for Cancellation/Refusal: Pt declined mobility at this time. Pt currently working w/ PT. Will check back as schedule permits.      [facility]

## 2022-01-09 NOTE — Progress Notes (Signed)
Chaplain provided follow up support to [facility] as she shared about some of the stressors in her life which included housing and difficulty getting out of the house due to ongoing health issues.  She also shared some dreams of having her own place some time. She is also eagerly awaiting the news of her grandchild's birth as her daughter is in labor with her 7th grandchild currently.  She wishes she could be in [place] with her daughter instead of the hospital.  Chaplain provided emotional and spiritual support through listening and prayer.  [address], [place] Pager, [phone]

## 2022-01-09 NOTE — Telephone Encounter (Signed)
ATC patient.  LM for patient to call back and need of follow visit with Dr. Annamaria Boots.

## 2022-01-09 NOTE — Progress Notes (Signed)
PROGRESS NOTE    April [name]  OEH:[id number] DOB: June 20, 1970 DOA: 01/07/2022 PCP: [name], MD (Confirm with patient/family/NH records and if not entered, this HAS to be entered at [facility] point of entry. "No PCP" if truly none.)   Chief Complaint  Patient presents with   Shortness of Breath    Brief Narrative:  April [name] is a pleasant 51 y.o. female with medical history significant for hypertension, chronic diastolic CHF, atrial fibrillation on Eliquis, chronic pain, hypothyroidism, OSA, BMI 68, and asthma who presents to the emergency department with worsening shortness of breath, cough, and wheezing.     She was admitted for acute respiratory failure with hypoxia from acute asthma exacerbation.  She was started on IV steroids and duonebs.  Assessment & Plan:   Principal Problem:   Acute asthma exacerbation Active Problems:   HTN (hypertension)   AF (paroxysmal atrial fibrillation) (HCC)   Chronic heart failure with preserved ejection fraction (HFpEF) (HCC)   Acute respiratory failure with hypoxia (HCC)   Obstructive sleep apnea  Acute respiratory failure with hypoxia requiring about 2 lit of [place] oxygen.  - secondary to acute asthma exacerbation.  - started on IV steroids, duonebs, flutter valve and recommend ambulation and getting out of bed.    Chronic diastolic heart failure:  She appears to be compensated.  Continue with lasix and coreg.    PAF;  Rate controlled.  Continue with Eliquis for anti coagulation.    Hypertension:  BP parameters are well controlled.     Chronic pain syndrome:  Resume home medications.    OSA;  Continue with CPAP at night.       DVT prophylaxis: eliquis.  Code Status: full code.  Family Communication: none at bedside.  Disposition:   Status is: Inpatient Remains inpatient appropriate because: IV steroids.    Level of care: Med-Surg Consultants:  None.   Procedures: none.   Antimicrobials: none.     Subjective: Breathing a little better, cough is improving.  Reports of left hip pain.  No nausea, vomiting or abdominal pain.  No chest pain.   Objective: Vitals:   01/09/22 0439 01/09/22 0513 01/09/22 0840 01/09/22 0906  BP: (!) 146/79   (!) 158/90  Pulse: 78     Resp: 18     Temp: 98.1 F (36.7 C)     TempSrc:      SpO2: 100%  98%   Weight:  (!) 148.8 kg    Height:       No intake or output data in the 24 hours ending 01/09/22 1045 Filed Weights   01/07/22 2325 01/09/22 0513  Weight: (!) 153 kg (!) 148.8 kg    Examination:  General exam: Appears calm and comfortable  Respiratory system: Clear to auscultation. Respiratory effort normal. Cardiovascular system: S1 & S2 heard, RRR.No pedal edema. Gastrointestinal system: Abdomen is nondistended, soft and nontender. Normal bowel sounds heard. Central nervous system: Alert and oriented. No focal neurological deficits. Extremities: Symmetric 5 x 5 power. Skin: No rashes, lesions or ulcers Psychiatry: Mood & affect appropriate.     Data Reviewed: I have personally reviewed following labs and imaging studies  CBC: Recent Labs  Lab 01/07/22 2342 01/09/22 0527  WBC 13.5* 16.0*  NEUTROABS 6.1  --   HGB 12.0 11.2*  HCT 39.5 36.9  MCV 84.2 82.6  PLT 418* 426*    Basic Metabolic Panel: Recent Labs  Lab 01/07/22 2342 01/09/22 0527  NA 138 137  K 4.5  4.2  CL 110 109  CO2 23 21*  GLUCOSE 75 166*  BUN 10 11  CREATININE 0.81 0.85  CALCIUM 8.7* 8.9  MG  --  2.4    GFR: Estimated Creatinine Clearance: 120.2 mL/min (by C-G formula based on SCr of 0.85 mg/dL).  Liver Function Tests: Recent Labs  Lab 01/07/22 2342  AST 15  ALT 12  ALKPHOS 72  BILITOT 0.6  PROT 7.8  ALBUMIN 3.6    CBG: No results for input(s): "GLUCAP" in the last 168 hours.   Recent Results (from the past 240 hour(s))  Resp Panel by RT-PCR (Flu A&B, Covid) Anterior Nasal Swab     Status: None   Collection Time: 01/08/22 12:11  AM   Specimen: Anterior Nasal Swab  Result Value Ref Range Status   SARS Coronavirus 2 by RT PCR NEGATIVE NEGATIVE Final    Comment: (NOTE) SARS-CoV-2 target nucleic acids are NOT DETECTED.  The SARS-CoV-2 RNA is generally detectable in upper respiratory specimens during the acute phase of infection. The lowest concentration of SARS-CoV-2 viral copies this assay can detect is 138 copies/mL. A negative result does not preclude SARS-Cov-2 infection and should not be used as the sole basis for treatment or other patient management decisions. A negative result may occur with  improper specimen collection/handling, submission of specimen other than nasopharyngeal swab, presence of viral mutation(s) within the areas targeted by this assay, and inadequate number of viral copies(<138 copies/mL). A negative result must be combined with clinical observations, patient history, and epidemiological information. The expected result is Negative.  Fact Sheet for Patients:  EntrepreneurPulse.com.au  Fact Sheet for Healthcare Providers:  IncredibleEmployment.be  This test is no t yet approved or cleared by the [place] FDA and  has been authorized for detection and/or diagnosis of SARS-CoV-2 by FDA under an Emergency Use Authorization (EUA). This EUA will remain  in effect (meaning this test can be used) for the duration of the COVID-19 declaration under Section 564(b)(1) of the Act, 21 U.S.C.section 360bbb-3(b)(1), unless the authorization is terminated  or revoked sooner.       Influenza A by PCR NEGATIVE NEGATIVE Final   Influenza B by PCR NEGATIVE NEGATIVE Final    Comment: (NOTE) The Xpert Xpress SARS-CoV-2/FLU/RSV plus assay is intended as an aid in the diagnosis of influenza from Nasopharyngeal swab specimens and should not be used as a sole basis for treatment. Nasal washings and aspirates are unacceptable for Xpert Xpress  SARS-CoV-2/FLU/RSV testing.  Fact Sheet for Patients: EntrepreneurPulse.com.au  Fact Sheet for Healthcare Providers: IncredibleEmployment.be  This test is not yet approved or cleared by the [place] FDA and has been authorized for detection and/or diagnosis of SARS-CoV-2 by FDA under an Emergency Use Authorization (EUA). This EUA will remain in effect (meaning this test can be used) for the duration of the COVID-19 declaration under Section 564(b)(1) of the Act, 21 U.S.C. section 360bbb-3(b)(1), unless the authorization is terminated or revoked.  Performed at [facility], [place] [address] [place], [place] [address]   Respiratory (~20 pathogens) panel by PCR     Status: None   Collection Time: 01/08/22  7:58 AM   Specimen: Nasopharyngeal Swab; Respiratory  Result Value Ref Range Status   Adenovirus NOT DETECTED NOT DETECTED Final   Coronavirus 229E NOT DETECTED NOT DETECTED Final    Comment: (NOTE) The Coronavirus on the Respiratory Panel, DOES NOT test for the novel  Coronavirus (2019 nCoV)    Coronavirus HKU1 NOT DETECTED  NOT DETECTED Final   Coronavirus NL63 NOT DETECTED NOT DETECTED Final   Coronavirus OC43 NOT DETECTED NOT DETECTED Final   Metapneumovirus NOT DETECTED NOT DETECTED Final   Rhinovirus / Enterovirus NOT DETECTED NOT DETECTED Final   Influenza A NOT DETECTED NOT DETECTED Final   Influenza B NOT DETECTED NOT DETECTED Final   Parainfluenza Virus 1 NOT DETECTED NOT DETECTED Final   Parainfluenza Virus 2 NOT DETECTED NOT DETECTED Final   Parainfluenza Virus 3 NOT DETECTED NOT DETECTED Final   Parainfluenza Virus 4 NOT DETECTED NOT DETECTED Final   Respiratory Syncytial Virus NOT DETECTED NOT DETECTED Final   Bordetella pertussis NOT DETECTED NOT DETECTED Final   Bordetella Parapertussis NOT DETECTED NOT DETECTED Final   Chlamydophila pneumoniae NOT DETECTED NOT DETECTED Final   Mycoplasma pneumoniae  NOT DETECTED NOT DETECTED Final    Comment: Performed at [facility], [place] [address], [place], [place] [address]         Radiology Studies: DG Chest Port 1 View  Result Date: 01/08/2022 CLINICAL DATA:  Dyspnea, shortness of breath EXAM: PORTABLE CHEST 1 VIEW COMPARISON:  10/23/2021 FINDINGS: The heart size and mediastinal contours are within normal limits. Both lungs are clear. The visualized skeletal structures are unremarkable. IMPRESSION: No active disease. Electronically Signed   By: Donavan Foil M.D.   On: 01/08/2022 00:00        Scheduled Meds:  amLODipine  10 mg Oral Daily   apixaban  5 mg Oral BID   carvedilol  6.25 mg Oral BID   cloNIDine  0.2 mg Oral BID   DULoxetine  60 mg Oral Daily   furosemide  20 mg Oral Daily   influenza vac split quadrivalent PF  0.5 mL Intramuscular Tomorrow-1000   ipratropium-albuterol  3 mL Nebulization Q6H   levothyroxine  50 mcg Oral Daily   methylPREDNISolone (SOLU-MEDROL) injection  125 mg Intravenous Q12H   montelukast  10 mg Oral QHS   pantoprazole  40 mg Oral Daily   pregabalin  300 mg Oral BID   topiramate  200 mg Oral QHS   Continuous Infusions:   LOS: 1 day       Hosie Poisson, MD [facility]   To contact the attending provider between 7A-7P or the covering provider during after hours 7P-7A, please log into the web site [email or web address] and access using universal [name] password for that web site. If you do not have the password, please call the hospital operator.  01/09/2022, 10:45 AM

## 2022-01-10 ENCOUNTER — Inpatient Hospital Stay (HOSPITAL_COMMUNITY): Payer: Medicare Other

## 2022-01-10 DIAGNOSIS — J9601 Acute respiratory failure with hypoxia: Secondary | ICD-10-CM | POA: Diagnosis not present

## 2022-01-10 DIAGNOSIS — I480 Paroxysmal atrial fibrillation: Secondary | ICD-10-CM | POA: Diagnosis not present

## 2022-01-10 DIAGNOSIS — I5032 Chronic diastolic (congestive) heart failure: Secondary | ICD-10-CM | POA: Diagnosis not present

## 2022-01-10 DIAGNOSIS — J4551 Severe persistent asthma with (acute) exacerbation: Secondary | ICD-10-CM | POA: Diagnosis not present

## 2022-01-10 LAB — CBC
HCT: 37.5 % (ref 36.0–46.0)
Hemoglobin: 10.9 g/dL — ABNORMAL LOW (ref 12.0–15.0)
MCH: 24.8 pg — ABNORMAL LOW (ref 26.0–34.0)
MCHC: 29.1 g/dL — ABNORMAL LOW (ref 30.0–36.0)
MCV: 85.4 fL (ref 80.0–100.0)
Platelets: 417 10*3/uL — ABNORMAL HIGH (ref 150–400)
RBC: 4.39 MIL/uL (ref 3.87–5.11)
RDW: 20 % — ABNORMAL HIGH (ref 11.5–15.5)
WBC: 17.3 10*3/uL — ABNORMAL HIGH (ref 4.0–10.5)
nRBC: 0 % (ref 0.0–0.2)

## 2022-01-10 LAB — BASIC METABOLIC PANEL
Anion gap: 7 (ref 5–15)
BUN: 21 mg/dL — ABNORMAL HIGH (ref 6–20)
CO2: 20 mmol/L — ABNORMAL LOW (ref 22–32)
Calcium: 8.8 mg/dL — ABNORMAL LOW (ref 8.9–10.3)
Chloride: 109 mmol/L (ref 98–111)
Creatinine, Ser: 0.96 mg/dL (ref 0.44–1.00)
GFR, Estimated: >60 mL/min (ref >60)
Glucose, Bld: 162 mg/dL — ABNORMAL HIGH (ref 70–99)
Potassium: 4.1 mmol/L (ref 3.5–5.1)
Sodium: 136 mmol/L (ref 135–145)

## 2022-01-10 MED ORDER — PREDNISONE 50 MG PO TABS
60.0000 mg | ORAL_TABLET | Freq: Every day | ORAL | Status: AC
  Administered 2022-01-11 – 2022-01-13 (×3): 60 mg via ORAL
  Filled 2022-01-10 (×3): qty 1

## 2022-01-10 MED ORDER — METHYLPREDNISOLONE SODIUM SUCC 40 MG IJ SOLR
40.0000 mg | Freq: Once | INTRAMUSCULAR | Status: AC
  Administered 2022-01-10: 40 mg via INTRAVENOUS
  Filled 2022-01-10: qty 1

## 2022-01-10 MED ORDER — IPRATROPIUM-ALBUTEROL 0.5-2.5 (3) MG/3ML IN SOLN
3.0000 mL | Freq: Three times a day (TID) | RESPIRATORY_TRACT | Status: DC
  Administered 2022-01-11 – 2022-01-13 (×7): 3 mL via RESPIRATORY_TRACT
  Filled 2022-01-10 (×7): qty 3

## 2022-01-10 NOTE — Progress Notes (Signed)
Physical Therapy Treatment Patient Details Name: April [name] MRN: [id number] DOB: March 13, 1971 Today's Date: 01/10/2022   History of Present Illness 51 y.o. female with medical history significant for hypertension, chronic diastolic CHF, atrial fibrillation on Eliquis, chronic pain, hypothyroidism, OSA, BMI 54, and asthma who presents to the emergency department with worsening shortness of breath, cough, and wheezing. Dx of asthma exacerbation.    PT Comments    Patient  eager to ambulate. Patient ambulated x 60' x 2 on 4 L [place] , did not use AD.  SPO2 86%  after second  walk. Returned to 100% quickly, patient encouraged to breathe in through nose, tends to mouth breathe.  Patient asking about a pulse oximeter for home.  Recommendations for follow up therapy are one component of a multi-disciplinary discharge planning process, led by the attending physician.  Recommendations may be updated based on patient status, additional functional criteria and insurance authorization.  Follow Up Recommendations  Home health PT     Assistance Recommended at Discharge Set up Supervision/Assistance  Patient can return home with the following Assistance with cooking/housework;Help with stairs or ramp for entrance   Equipment Recommendations  None recommended by PT    Recommendations for Other Services       Precautions / Restrictions Precautions Precautions: Fall Precaution Comments: monitor O2     Mobility  Bed Mobility Overal bed mobility: Independent                  Transfers   Equipment used: None   Sit to Stand: Independent                Ambulation/Gait Ambulation/Gait assistance: Supervision Gait Distance (Feet): 100 Feet (x 2) Assistive device: None Gait Pattern/deviations: Step-through pattern, Decreased stride length Gait velocity: decr     General Gait Details: held rail intermittently, mostly free ambulation.   Stairs             Wheelchair  Mobility    Modified Rankin (Stroke Patients Only)       Balance Overall balance assessment: Modified Independent                                          Cognition Arousal/Alertness: Awake/alert                                              Exercises      General Comments        Pertinent Vitals/Pain Pain Assessment Pain Assessment: No/denies pain    Home Living                          Prior Function            PT Goals (current goals can now be found in the care plan section) Progress towards PT goals: Progressing toward goals    Frequency    Min 3X/week      PT Plan Current plan remains appropriate    Co-evaluation              AM-PAC PT "6 Clicks" Mobility   Outcome Measure  Help needed turning from your back to your side while in a flat bed without using bedrails?: None Help needed  moving from lying on your back to sitting on the side of a flat bed without using bedrails?: None Help needed moving to and from a bed to a chair (including a wheelchair)?: None Help needed standing up from a chair using your arms (e.g., wheelchair or bedside chair)?: None Help needed to walk in hospital room?: None Help needed climbing 3-5 steps with a railing? : A Little 6 Click Score: 23    End of Session Equipment Utilized During Treatment: Oxygen Activity Tolerance: Patient tolerated treatment well Patient left: in bed;with call bell/phone within reach Nurse Communication: Mobility status PT Visit Diagnosis: Difficulty in walking, not elsewhere classified (R26.2)     Time: 5284-1324 PT Time Calculation (min) (ACUTE ONLY): 20 min  Charges:  $Gait Training: 8-22 mins                     [place] Office [phone] Weekend [phone]    Claretha Cooper 01/10/2022, 2:39 PM

## 2022-01-10 NOTE — Progress Notes (Signed)
SDOH Interventions Today    Flowsheet Row Most Recent Value  SDOH Interventions   Housing Interventions Other (Comment) (P)   [Community resources provided to pt]

## 2022-01-10 NOTE — Progress Notes (Signed)
Mobility Specialist Cancellation/Refusal Note:   Reason for Cancellation/Refusal: Pt declined mobility at this time. Pt getting ready to wash up & mentioned she will not be ready to ambulate until after an hour. Will check back as schedule permits.      [facility]

## 2022-01-10 NOTE — Progress Notes (Signed)
PROGRESS NOTE    April [name]  XHB:[id number] DOB: [birth year] DOA: 01/07/2022 PCP: [name], MD    Chief Complaint  Patient presents with   Shortness of Breath    Brief Narrative:  April [name] is a pleasant 51 y.o. female with medical history significant for hypertension, chronic diastolic CHF, atrial fibrillation on Eliquis, chronic pain, hypothyroidism, OSA, BMI 97, and asthma who presents to the emergency department with worsening shortness of breath, cough, and wheezing.     She was admitted for acute respiratory failure with hypoxia from acute asthma exacerbation.  She was started on IV steroids and duonebs.  Assessment & Plan:   Principal Problem:   Acute asthma exacerbation Active Problems:   HTN (hypertension)   AF (paroxysmal atrial fibrillation) (HCC)   Chronic heart failure with preserved ejection fraction (HFpEF) (HCC)   Acute respiratory failure with hypoxia (HCC)   Obstructive sleep apnea  Acute respiratory failure with hypoxia requiring about 2 lit of [place] oxygen.  - secondary to acute asthma exacerbation.  - started on IV steroids, duonebs, flutter valve and recommend ambulation and getting out of bed.  - wheezing has improved, transition to oral steroids in am.  - plan to wean her off oxygen   Chronic diastolic heart failure:  She appears to be compensated.  Continue with lasix and coreg.    PAF;  Rate controlled.  Continue with Eliquis for anti coagulation.    Hypertension:  BP parameters are well controlled.  Continue with amlodipine, coreg, clonidine , lasix. No changes in meds.     Chronic pain syndrome:  Resume home medications. Patient takes about 10/'325mg'$  of oxycodone .    OSA;  Continue with CPAP at night.    Hypothyroidism:  Resume synthroid 50 mcg daily.    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    DVT prophylaxis: eliquis.  Code Status: full code.  Family Communication: none at  bedside.  Disposition:   Status is: Inpatient Remains inpatient appropriate because: IV steroids.    Level of care: Med-Surg Consultants:  None.   Procedures: none.   Antimicrobials: none.    Subjective: Hip pain. Breathing has improved, no chest pain . No nausea or vomiting.   Objective: Vitals:   01/09/22 1808 01/09/22 1934 01/10/22 0524 01/10/22 0824  BP:  (!) 157/80 (!) 150/77   Pulse:  73 71   Resp:  18 18   Temp:  (!) 97.3 F (36.3 C) 98.6 F (37 C)   TempSrc:  Oral Oral   SpO2: 96% 100% 100% 99%  Weight:   (!) 151.5 kg   Height:        Intake/Output Summary (Last 24 hours) at 01/10/2022 1314 Last data filed at 01/10/2022 1030 Gross per 24 hour  Intake 772 ml  Output --  Net 772 ml   Filed Weights   01/07/22 2325 01/09/22 0513 01/10/22 0524  Weight: (!) 153 kg (!) 148.8 kg (!) 151.5 kg    Examination:  General exam: Appears calm and comfortable  Respiratory system: scattered  exp wheezing bilaterally , on [place] oxygen 2 lit /min.  Cardiovascular system: S1 & S2 heard, RRR.  No pedal edema. Gastrointestinal system: Abdomen is nondistended, soft and nontender.  Normal bowel sounds heard. Central nervous system: Alert and oriented. No focal neurological deficits. Extremities: Symmetric 5 x 5 power. Skin: No rashes, lesions or ulcers Psychiatry: Mood & affect appropriate.     Data Reviewed: I have  personally reviewed following labs and imaging studies  CBC: Recent Labs  Lab 01/07/22 2342 01/09/22 0527 01/10/22 0544  WBC 13.5* 16.0* 17.3*  NEUTROABS 6.1  --   --   HGB 12.0 11.2* 10.9*  HCT 39.5 36.9 37.5  MCV 84.2 82.6 85.4  PLT 418* 426* 417*     Basic Metabolic Panel: Recent Labs  Lab 01/07/22 2342 01/09/22 0527 01/10/22 0544  NA 138 137 136  K 4.5 4.2 4.1  CL 110 109 109  CO2 23 21* 20*  GLUCOSE 75 166* 162*  BUN 10 11 21*  CREATININE 0.81 0.85 0.96  CALCIUM 8.7* 8.9 8.8*  MG  --  2.4  --      GFR: Estimated Creatinine  Clearance: 107.7 mL/min (by C-G formula based on SCr of 0.96 mg/dL).  Liver Function Tests: Recent Labs  Lab 01/07/22 2342  AST 15  ALT 12  ALKPHOS 72  BILITOT 0.6  PROT 7.8  ALBUMIN 3.6     CBG: No results for input(s): "GLUCAP" in the last 168 hours.   Recent Results (from the past 240 hour(s))  Resp Panel by RT-PCR (Flu A&B, Covid) Anterior Nasal Swab     Status: None   Collection Time: 01/08/22 12:11 AM   Specimen: Anterior Nasal Swab  Result Value Ref Range Status   SARS Coronavirus 2 by RT PCR NEGATIVE NEGATIVE Final    Comment: (NOTE) SARS-CoV-2 target nucleic acids are NOT DETECTED.  The SARS-CoV-2 RNA is generally detectable in upper respiratory specimens during the acute phase of infection. The lowest concentration of SARS-CoV-2 viral copies this assay can detect is 138 copies/mL. A negative result does not preclude SARS-Cov-2 infection and should not be used as the sole basis for treatment or other patient management decisions. A negative result may occur with  improper specimen collection/handling, submission of specimen other than nasopharyngeal swab, presence of viral mutation(s) within the areas targeted by this assay, and inadequate number of viral copies(<138 copies/mL). A negative result must be combined with clinical observations, patient history, and epidemiological information. The expected result is Negative.  Fact Sheet for Patients:  EntrepreneurPulse.com.au  Fact Sheet for Healthcare Providers:  IncredibleEmployment.be  This test is no t yet approved or cleared by the [place] FDA and  has been authorized for detection and/or diagnosis of SARS-CoV-2 by FDA under an Emergency Use Authorization (EUA). This EUA will remain  in effect (meaning this test can be used) for the duration of the COVID-19 declaration under Section 564(b)(1) of the Act, 21 U.S.C.section 360bbb-3(b)(1), unless the authorization is  terminated  or revoked sooner.       Influenza A by PCR NEGATIVE NEGATIVE Final   Influenza B by PCR NEGATIVE NEGATIVE Final    Comment: (NOTE) The Xpert Xpress SARS-CoV-2/FLU/RSV plus assay is intended as an aid in the diagnosis of influenza from Nasopharyngeal swab specimens and should not be used as a sole basis for treatment. Nasal washings and aspirates are unacceptable for Xpert Xpress SARS-CoV-2/FLU/RSV testing.  Fact Sheet for Patients: EntrepreneurPulse.com.au  Fact Sheet for Healthcare Providers: IncredibleEmployment.be  This test is not yet approved or cleared by the [place] FDA and has been authorized for detection and/or diagnosis of SARS-CoV-2 by FDA under an Emergency Use Authorization (EUA). This EUA will remain in effect (meaning this test can be used) for the duration of the COVID-19 declaration under Section 564(b)(1) of the Act, 21 U.S.C. section 360bbb-3(b)(1), unless the authorization is terminated or revoked.  Performed at  [facility], [place] [address] [place], [place] [address]   Respiratory (~20 pathogens) panel by PCR     Status: None   Collection Time: 01/08/22  7:58 AM   Specimen: Nasopharyngeal Swab; Respiratory  Result Value Ref Range Status   Adenovirus NOT DETECTED NOT DETECTED Final   Coronavirus 229E NOT DETECTED NOT DETECTED Final    Comment: (NOTE) The Coronavirus on the Respiratory Panel, DOES NOT test for the novel  Coronavirus (2019 nCoV)    Coronavirus HKU1 NOT DETECTED NOT DETECTED Final   Coronavirus NL63 NOT DETECTED NOT DETECTED Final   Coronavirus OC43 NOT DETECTED NOT DETECTED Final   Metapneumovirus NOT DETECTED NOT DETECTED Final   Rhinovirus / Enterovirus NOT DETECTED NOT DETECTED Final   Influenza A NOT DETECTED NOT DETECTED Final   Influenza B NOT DETECTED NOT DETECTED Final   Parainfluenza Virus 1 NOT DETECTED NOT DETECTED Final   Parainfluenza Virus 2 NOT  DETECTED NOT DETECTED Final   Parainfluenza Virus 3 NOT DETECTED NOT DETECTED Final   Parainfluenza Virus 4 NOT DETECTED NOT DETECTED Final   Respiratory Syncytial Virus NOT DETECTED NOT DETECTED Final   Bordetella pertussis NOT DETECTED NOT DETECTED Final   Bordetella Parapertussis NOT DETECTED NOT DETECTED Final   Chlamydophila pneumoniae NOT DETECTED NOT DETECTED Final   Mycoplasma pneumoniae NOT DETECTED NOT DETECTED Final    Comment: Performed at [facility], [place]. [address] [place], [place] [address]         Radiology Studies: US PELVIS (TRANSABDOMINAL ONLY)  Result Date: 01/10/2022 CLINICAL DATA:  Pelvic pain EXAM: TRANSABDOMINAL ULTRASOUND OF PELVIS TECHNIQUE: Transabdominal ultrasound examination of the pelvis was performed including evaluation of the uterus, ovaries, adnexal regions, and pelvic cul-de-sac. COMPARISON:  CT abdomen 10/23/2021 FINDINGS: Uterus Measurements: 8.3 x 5.6 x 6.7 cm = volume: 161.7 mL. No fibroids or other mass visualized. Endometrium Thickness: 11 mm.  No focal abnormality visualized. Right ovary Measurements: 3.9 x 2.9 x 4.1 cm = volume: 24.1 mL. Normal appearance/no adnexal mass. Left ovary Not visualized. Other findings:  No abnormal free fluid. IMPRESSION: 1. No acute pelvic abnormality. No findings to explain the patient's pelvic pain. 2. Left ovary is not visualized. Electronically Signed   By: Kathreen Devoid M.D.   On: 01/10/2022 12:49   DG HIP UNILAT WITH PELVIS 2-3 VIEWS LEFT  Result Date: 01/09/2022 CLINICAL DATA:  Left hip pain.  No known injury. EXAM: DG HIP (WITH OR WITHOUT PELVIS) 2-3V LEFT COMPARISON:  CT abdomen and pelvis 10/23/2021 FINDINGS: The bilateral femoroacetabular and sacroiliac joint spaces are maintained. Mild pubic symphysis subchondral sclerosis and cystic degenerative change. No acute fracture or dislocation. IMPRESSION: Mild pubic symphysis osteoarthritis. Electronically Signed   By: Yvonne Kendall M.D.   On: 01/09/2022  12:17        Scheduled Meds:  amLODipine  10 mg Oral Daily   apixaban  5 mg Oral BID   carvedilol  6.25 mg Oral BID   cloNIDine  0.2 mg Oral BID   DULoxetine  60 mg Oral Daily   furosemide  20 mg Oral Daily   influenza vac split quadrivalent PF  0.5 mL Intramuscular Tomorrow-1000   ipratropium-albuterol  3 mL Nebulization Q6H   levothyroxine  50 mcg Oral Daily   methylPREDNISolone (SOLU-MEDROL) injection  40 mg Intravenous Once   montelukast  10 mg Oral QHS   pantoprazole  40 mg Oral Daily   [START ON 01/11/2022] predniSONE  60 mg Oral QAC breakfast  pregabalin  300 mg Oral BID   topiramate  200 mg Oral QHS   Continuous Infusions:   LOS: 2 days       Hosie Poisson, MD [facility]   To contact the attending provider between 7A-7P or the covering provider during after hours 7P-7A, please log into the web site [email or web address] and access using universal [place] password for that web site. If you do not have the password, please call the hospital operator.  01/10/2022, 1:14 PM

## 2022-01-10 NOTE — Progress Notes (Signed)
Pt refused cpap tonight, prefers to wear nasal cannula instead.  Pt was advised that RT is available all night should she change her mind.

## 2022-01-10 NOTE — TOC Initial Note (Signed)
Transition of Care (TOC) - Initial/Assessment Note    Patient Details  Name: April [name] MRN: [id number] Date of Birth: 07/02/1970  Transition of Care (TOC) CM/SW Contact:     A , LCSW Phone Number: 01/10/2022, 11:29 AM  Clinical Narrative:                 Met with pt and confirmed plan to return home with home health services. HHPT and SW has been arranged through [facility]. Orders for HH will need to be placed prior to discharge. CSW also discussed SDOH concerns for housing. Pt states she has been seeking assistance for her rent and lower income apartments however, has been struggling to navigate this system on her own. Pt is agreeable to having resources given and is agreeable to having HHSW arranged to continue to assist with community resources. Resources have been added to pt's AVS.   Expected Discharge Plan: Home w Home Health Services Barriers to Discharge: No Barriers Identified   Patient Goals and CMS Choice Patient states their goals for this hospitalization and ongoing recovery are:: To return home   Choice offered to / list presented to : Patient  Expected Discharge Plan and Services Expected Discharge Plan: Home w Home Health Services In-house Referral: NA Discharge Planning Services: CM Consult Post Acute Care Choice: Home Health Living arrangements for the past 2 months: Apartment                 DME Arranged: N/A DME Agency: NA       HH Arranged: PT, Social Work HH Agency: [facility] Date HH Agency Contacted: 01/10/22 Time HH Agency Contacted: 1129 Representative spoke with at HH Agency: Corey  Prior Living Arrangements/Services Living arrangements for the past 2 months: Apartment Lives with:: Self Patient language and need for interpreter reviewed:: Yes Do you feel safe going back to the place where you live?: Yes      Need for Family Participation in Patient Care: No (Comment) Care giver support system in place?: No  (comment) Current home services: DME Criminal Activity/Legal Involvement Pertinent to Current Situation/Hospitalization: No - Comment as needed  Activities of Daily Living Home Assistive Devices/Equipment: Walker (specify type) (front wheel) ADL Screening (condition at time of admission) Patient's cognitive ability adequate to safely complete daily activities?: No Is the patient deaf or have difficulty hearing?: No Does the patient have difficulty seeing, even when wearing glasses/contacts?: No Does the patient have difficulty concentrating, remembering, or making decisions?: No Patient able to express need for assistance with ADLs?: No Does the patient have difficulty dressing or bathing?: No Independently performs ADLs?: Yes (appropriate for developmental age) Does the patient have difficulty walking or climbing stairs?: Yes Weakness of Legs: None Weakness of Arms/Hands: None  Permission Sought/Granted Permission sought to share information with : Case Manager Permission granted to share information with : No              Emotional Assessment Appearance:: Appears stated age Attitude/Demeanor/Rapport: Engaged Affect (typically observed): Accepting Orientation: : Oriented to Self, Oriented to Place, Oriented to  Time, Oriented to Situation Alcohol / Substance Use: Not Applicable Psych Involvement: No (comment)  Admission diagnosis:  COPD exacerbation (HCC) [J44.1] Acute respiratory failure with hypoxia (HCC) [J96.01] Acute asthma exacerbation [J45.901] Patient Active Problem List   Diagnosis Date Noted   Chronic pain syndrome    Severe persistent asthma with acute exacerbation in adult 08/23/2021   Hypothyroidism 08/23/2021   Chronic heart failure with preserved   ejection fraction (HFpEF) ([place]) 08/23/2021   Primary osteoarthritis of right knee 07/04/2021   Body mass index 50.0-59.9, adult ([place]) 07/04/2021   Painful orthopaedic hardware ([place]) 07/04/2021   COPD with  exacerbation ([place]) 02/04/2021   Severe persistent asthma 07/04/2020   Acute respiratory failure ([place]) 02/20/2019   CHF exacerbation ([place]) 02/20/2019   Hot flashes    Asthma-COPD overlap syndrome ([place]) 10/26/2018   Acute respiratory distress 08/03/2018   Acute exacerbation of chronic obstructive pulmonary disease (COPD) ([place]) 08/03/2018   Vitamin D deficiency 05/26/2018   Acute asthma exacerbation 05/16/2018   Urinary incontinence 11/17/2017   Nausea with vomiting 11/06/2017   Hypomagnesemia 10/27/2017   Dysphagia 10/27/2017   Physical deconditioning 10/25/2017   Acute maxillary sinusitis 10/24/2017   Emesis, persistent 10/08/2017   Volume depletion 10/08/2017   Chest pain 10/08/2017   SOB (shortness of breath) 10/08/2017   Debility 09/22/2017   AKI (acute kidney injury) ([place]) 08/18/2017   HCAP (healthcare-associated pneumonia) 08/06/2017   Chronic atrial fibrillation 07/29/2017   Acute respiratory failure with hypoxia (HCC) 07/07/2017   AF (paroxysmal atrial fibrillation) (HCC)    Dyspnea 02/04/2017   Asthma exacerbation 03/00/9233   Metabolic syndrome 00/76/2263   Moderate persistent asthma with exacerbation 10/19/2016   Normocytic anemia 10/02/2016   Elevated hemoglobin A1c 05/05/2016   GERD (gastroesophageal reflux disease) 08/30/2015   Anxiety and depression 08/30/2015   Generalized anxiety disorder 08/30/2015   Hyperlipidemia 12/08/2013   Seasonal allergic rhinitis 08/29/2013   Leukocytosis 10/21/2012   Tobacco abuse in remission 10/07/2012   COPD mixed type ([place]) 05/07/2012   Knee pain, bilateral 04/25/2011   Insomnia 03/14/2011   Obstructive sleep apnea 12/19/2010   Hypokalemia 08/13/2010   Cervical back pain with evidence of disc disease 04/08/2008   HTN (hypertension) 07/31/2006   Morbid obesity due to excess calories ([place]) 06/17/2006   Major depressive disorder, recurrent episode ([place]) 04/10/2006   PCP:  [name], MD Pharmacy:   [facility]  [place], [address] [place] [address] Phone: [phone] Fax: [phone]  [place] [address] Phone: [phone] Fax: [phone]     Social Determinants of Health (SDOH) Interventions Housing Interventions: Other (Comment) (Community resources provided to pt)  Readmission Risk Interventions    01/10/2022   11:28 AM 10/24/2021   12:29 PM 10/16/2021   11:39 AM  Readmission Risk Prevention Plan  Transportation Screening Complete Complete Complete  Medication Review Press photographer) Complete Complete Complete  PCP or Specialist appointment within 3-5 days of discharge Complete Complete Complete  HRI or Home Care Consult Complete Complete Complete  SW Recovery Care/Counseling Consult Complete Complete   Palliative Care Screening Not Applicable Not [place] Not Applicable Not Applicable

## 2022-01-11 ENCOUNTER — Other Ambulatory Visit (HOSPITAL_COMMUNITY): Payer: Self-pay

## 2022-01-11 DIAGNOSIS — Z6841 Body Mass Index (BMI) 40.0 and over, adult: Secondary | ICD-10-CM

## 2022-01-11 DIAGNOSIS — J441 Chronic obstructive pulmonary disease with (acute) exacerbation: Secondary | ICD-10-CM

## 2022-01-11 MED ORDER — PREDNISONE 10 MG PO TABS: ORAL_TABLET | ORAL | 0 refills | Status: DC

## 2022-01-11 NOTE — Progress Notes (Signed)
Pt refused CPAP only wants O2 through  or mask

## 2022-01-11 NOTE — Progress Notes (Signed)
Mobility Specialist - Progress Note   01/11/22 1031  Mobility  Activity Ambulated with assistance in hallway  Level of Assistance Independent after set-up  Assistive Device None  Distance Ambulated (ft) 250 ft  Activity Response Tolerated well  $Mobility charge 1 Mobility   Pt received in bed and agreed to mobilize. No c/o pain nor discomfort during ambulation. Pt felt SOB, checked O2 sat. Pt returned to bed with all needs met.   During SpO2: 99%  Addison Lank

## 2022-01-11 NOTE — Care Management Important Message (Signed)
Important Message  Patient Details IM Letter given to the Patient. Name: April [name] MRN: [id number] Date of Birth: 13-Jan-1971   Medicare Important Message Given:  Yes     Kerin Salen 01/11/2022, 10:30 AM

## 2022-01-11 NOTE — Plan of Care (Signed)

## 2022-01-11 NOTE — Progress Notes (Signed)
PROGRESS NOTE    April [name]  KYH:[id number] DOB: Sep 03, 1970 DOA: 01/07/2022 PCP: [name], MD    Chief Complaint  Patient presents with   Shortness of Breath    Brief Narrative:  April [name] is a pleasant 51 y.o. female with medical history significant for hypertension, chronic diastolic CHF, atrial fibrillation on Eliquis, chronic pain, hypothyroidism, OSA, BMI 74, and asthma who presents to the emergency department with worsening shortness of breath, cough, and wheezing.     She was admitted for acute respiratory failure with hypoxia from acute asthma exacerbation.  She was started on IV steroids and duonebs. Has been changed to PO prednisone.  Have prepared d/c for AM as I suspect she will be ready to go then.   Assessment & Plan:   Principal Problem:   Acute asthma exacerbation Active Problems:   HTN (hypertension)   AF (paroxysmal atrial fibrillation) (HCC)   Chronic heart failure with preserved ejection fraction (HFpEF) (HCC)   Acute respiratory failure with hypoxia (HCC)   Obstructive sleep apnea   Acute respiratory failure with hypoxia requiring about 2 lit of [place] oxygen.  - secondary to acute asthma exacerbation.  - started on IV steroids, duonebs, flutter valve and recommend ambulation and getting out of bed.  - wheezing has improved, transition to oral steroids taper - plan to wean her off oxygen as much as possible-- wears 4L intermittently at home  Chronic diastolic heart failure:  She appears to be compensated.  Continue with lasix and coreg.   PAF;  Rate controlled.  Continue with Eliquis for anti coagulation.   Hypertension:  BP parameters are well controlled.  Continue with amlodipine, coreg, clonidine , lasix. No changes in meds.   Chronic pain syndrome:  Resume home medications. Patient takes about 10/'325mg'$  of oxycodone .   OSA;  Continue with CPAP at night.   Morbid obesity Estimated body mass index is 53.91 kg/m as calculated from  the following:   Height as of this encounter: '5\' 6"'$  (1.676 m).   Weight as of this encounter: 151.5 kg.   Hypothyroidism:  Resume synthroid 50 mcg daily.   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    DVT prophylaxis: eliquis.  Code Status: full code.  Family Communication: none at bedside.  Disposition:   Status is: Inpatient Remains inpatient appropriate because: home in AM    Level of care: Med-Surg .  Subjective: Says she has not been ambulating or eating  Objective: Vitals:   01/10/22 2202 01/11/22 0242 01/11/22 0457 01/11/22 0837  BP: (!) 173/97  134/70   Pulse: 67  63 67  Resp:   (!) 21 18  Temp:   98.4 F (36.9 C)   TempSrc:      SpO2:  98% 100% 100%  Weight:      Height:        Intake/Output Summary (Last 24 hours) at 01/11/2022 1127 Last data filed at 01/11/2022 1517 Gross per 24 hour  Intake 240 ml  Output --  Net 240 ml   Filed Weights   01/07/22 2325 01/09/22 0513 01/10/22 0524  Weight: (!) 153 kg (!) 148.8 kg (!) 151.5 kg    Examination:   General: Appearance:    Severely obese female in no acute distress     Lungs:    Forced expiratory wheezing, respirations unlabored, on 4L [place]  Heart:    Normal heart rate.   MS:   All extremities are  intact.   Neurologic:   Awake, alert       Data Reviewed: I have personally reviewed following labs and imaging studies  CBC: Recent Labs  Lab 01/07/22 2342 01/09/22 0527 01/10/22 0544  WBC 13.5* 16.0* 17.3*  NEUTROABS 6.1  --   --   HGB 12.0 11.2* 10.9*  HCT 39.5 36.9 37.5  MCV 84.2 82.6 85.4  PLT 418* 426* 417*    Basic Metabolic Panel: Recent Labs  Lab 01/07/22 2342 01/09/22 0527 01/10/22 0544  NA 138 137 136  K 4.5 4.2 4.1  CL 110 109 109  CO2 23 21* 20*  GLUCOSE 75 166* 162*  BUN 10 11 21*  CREATININE 0.81 0.85 0.96  CALCIUM 8.7* 8.9 8.8*  MG  --  2.4  --     GFR: Estimated Creatinine Clearance: 107.7 mL/min (by C-G formula based on SCr of 0.96  mg/dL).  Liver Function Tests: Recent Labs  Lab 01/07/22 2342  AST 15  ALT 12  ALKPHOS 72  BILITOT 0.6  PROT 7.8  ALBUMIN 3.6    CBG: No results for input(s): "GLUCAP" in the last 168 hours.   Recent Results (from the past 240 hour(s))  Resp Panel by RT-PCR (Flu A&B, Covid) Anterior Nasal Swab     Status: None   Collection Time: 01/08/22 12:11 AM   Specimen: Anterior Nasal Swab  Result Value Ref Range Status   SARS Coronavirus 2 by RT PCR NEGATIVE NEGATIVE Final    Comment: (NOTE) SARS-CoV-2 target nucleic acids are NOT DETECTED.  The SARS-CoV-2 RNA is generally detectable in upper respiratory specimens during the acute phase of infection. The lowest concentration of SARS-CoV-2 viral copies this assay can detect is 138 copies/mL. A negative result does not preclude SARS-Cov-2 infection and should not be used as the sole basis for treatment or other patient management decisions. A negative result may occur with  improper specimen collection/handling, submission of specimen other than nasopharyngeal swab, presence of viral mutation(s) within the areas targeted by this assay, and inadequate number of viral copies(<138 copies/mL). A negative result must be combined with clinical observations, patient history, and epidemiological information. The expected result is Negative.  Fact Sheet for Patients:  EntrepreneurPulse.com.au  Fact Sheet for Healthcare Providers:  IncredibleEmployment.be  This test is no t yet approved or cleared by the [place] FDA and  has been authorized for detection and/or diagnosis of SARS-CoV-2 by FDA under an Emergency Use Authorization (EUA). This EUA will remain  in effect (meaning this test can be used) for the duration of the COVID-19 declaration under Section 564(b)(1) of the Act, 21 U.S.C.section 360bbb-3(b)(1), unless the authorization is terminated  or revoked sooner.       Influenza A by PCR  NEGATIVE NEGATIVE Final   Influenza B by PCR NEGATIVE NEGATIVE Final    Comment: (NOTE) The Xpert Xpress SARS-CoV-2/FLU/RSV plus assay is intended as an aid in the diagnosis of influenza from Nasopharyngeal swab specimens and should not be used as a sole basis for treatment. Nasal washings and aspirates are unacceptable for Xpert Xpress SARS-CoV-2/FLU/RSV testing.  Fact Sheet for Patients: EntrepreneurPulse.com.au  Fact Sheet for Healthcare Providers: IncredibleEmployment.be  This test is not yet approved or cleared by the [place] FDA and has been authorized for detection and/or diagnosis of SARS-CoV-2 by FDA under an Emergency Use Authorization (EUA). This EUA will remain in effect (meaning this test can be used) for the duration of the COVID-19 declaration under Section 564(b)(1) of the  Act, 21 U.S.C. section 360bbb-3(b)(1), unless the authorization is terminated or revoked.  Performed at [facility], [place] [address] [place], [place] [address]   Respiratory (~20 pathogens) panel by PCR     Status: None   Collection Time: 01/08/22  7:58 AM   Specimen: Nasopharyngeal Swab; Respiratory  Result Value Ref Range Status   Adenovirus NOT DETECTED NOT DETECTED Final   Coronavirus 229E NOT DETECTED NOT DETECTED Final    Comment: (NOTE) The Coronavirus on the Respiratory Panel, DOES NOT test for the novel  Coronavirus (2019 nCoV)    Coronavirus HKU1 NOT DETECTED NOT DETECTED Final   Coronavirus NL63 NOT DETECTED NOT DETECTED Final   Coronavirus OC43 NOT DETECTED NOT DETECTED Final   Metapneumovirus NOT DETECTED NOT DETECTED Final   Rhinovirus / Enterovirus NOT DETECTED NOT DETECTED Final   Influenza A NOT DETECTED NOT DETECTED Final   Influenza B NOT DETECTED NOT DETECTED Final   Parainfluenza Virus 1 NOT DETECTED NOT DETECTED Final   Parainfluenza Virus 2 NOT DETECTED NOT DETECTED Final   Parainfluenza Virus 3 NOT  DETECTED NOT DETECTED Final   Parainfluenza Virus 4 NOT DETECTED NOT DETECTED Final   Respiratory Syncytial Virus NOT DETECTED NOT DETECTED Final   Bordetella pertussis NOT DETECTED NOT DETECTED Final   Bordetella Parapertussis NOT DETECTED NOT DETECTED Final   Chlamydophila pneumoniae NOT DETECTED NOT DETECTED Final   Mycoplasma pneumoniae NOT DETECTED NOT DETECTED Final    Comment: Performed at [facility], [place]. [address] [place], [place] [address]         Radiology Studies: US PELVIS (TRANSABDOMINAL ONLY)  Result Date: 01/10/2022 CLINICAL DATA:  Pelvic pain EXAM: TRANSABDOMINAL ULTRASOUND OF PELVIS TECHNIQUE: Transabdominal ultrasound examination of the pelvis was performed including evaluation of the uterus, ovaries, adnexal regions, and pelvic cul-de-sac. COMPARISON:  CT abdomen 10/23/2021 FINDINGS: Uterus Measurements: 8.3 x 5.6 x 6.7 cm = volume: 161.7 mL. No fibroids or other mass visualized. Endometrium Thickness: 11 mm.  No focal abnormality visualized. Right ovary Measurements: 3.9 x 2.9 x 4.1 cm = volume: 24.1 mL. Normal appearance/no adnexal mass. Left ovary Not visualized. Other findings:  No abnormal free fluid. IMPRESSION: 1. No acute pelvic abnormality. No findings to explain the patient's pelvic pain. 2. Left ovary is not visualized. Electronically Signed   By: Kathreen Devoid M.D.   On: 01/10/2022 12:49   DG HIP UNILAT WITH PELVIS 2-3 VIEWS LEFT  Result Date: 01/09/2022 CLINICAL DATA:  Left hip pain.  No known injury. EXAM: DG HIP (WITH OR WITHOUT PELVIS) 2-3V LEFT COMPARISON:  CT abdomen and pelvis 10/23/2021 FINDINGS: The bilateral femoroacetabular and sacroiliac joint spaces are maintained. Mild pubic symphysis subchondral sclerosis and cystic degenerative change. No acute fracture or dislocation. IMPRESSION: Mild pubic symphysis osteoarthritis. Electronically Signed   By: Yvonne Kendall M.D.   On: 01/09/2022 12:17        Scheduled Meds:  amLODipine  10 mg Oral  Daily   apixaban  5 mg Oral BID   carvedilol  6.25 mg Oral BID   cloNIDine  0.2 mg Oral BID   DULoxetine  60 mg Oral Daily   furosemide  20 mg Oral Daily   influenza vac split quadrivalent PF  0.5 mL Intramuscular Tomorrow-1000   ipratropium-albuterol  3 mL Nebulization TID   levothyroxine  50 mcg Oral Daily   montelukast  10 mg Oral QHS   pantoprazole  40 mg Oral Daily   predniSONE  60 mg Oral  QAC breakfast   pregabalin  300 mg Oral BID   topiramate  200 mg Oral QHS   Continuous Infusions:   LOS: 3 days       Geradine Girt, DO [facility]   To contact the attending provider between 7A-7P or the covering provider during after hours 7P-7A, please log into the web site [email or web address] and access using universal [place] password for that web site. If you do not have the password, please call the hospital operator.  01/11/2022, 11:27 AM

## 2022-01-11 NOTE — Plan of Care (Signed)
  Problem: Education: Goal: Knowledge of General Education information will improve Description: Including pain rating scale, medication(s)/side effects and non-pharmacologic comfort measures Outcome: Progressing   Problem: Activity: Goal: Risk for activity intolerance will decrease Outcome: Progressing   Problem: Pain Managment: Goal: General experience of comfort will improve Outcome: Progressing   

## 2022-01-12 NOTE — Progress Notes (Signed)
Mobility Specialist - Progress Note   01/12/22 1128  Mobility  HOB Elevated/Bed Position Self regulated  Activity Ambulated independently in hallway  Range of Motion/Exercises Active  Level of Assistance Independent  Assistive Device None  Distance Ambulated (ft) 250 ft  Activity Response Tolerated well  Transport method Ambulatory  $Mobility charge 1 Mobility   Pt received in bed and agreeable to mobility. C/o wheezing & feeling lightheaded during ambulation. Nurse made aware of this.  Pt to bed after session with all needs met.    During mobility: 97% SpO2     Set designer

## 2022-01-12 NOTE — Progress Notes (Signed)
Pt ambulated today in hallway. O2 sats 97% on 1 L/min O2 via [facility]. Ambulation without O2 is 95%, while walking in hallway.  Pt wheezing after about 150 feet. C/o dizziness, which receded after pt got back to bed. Neb tx  given as ordered.  Pt states she has O2 at home that she uses PRN.

## 2022-01-12 NOTE — Hospital Course (Addendum)
51 y.o. female with medical history significant for hypertension, chronic diastolic CHF, atrial fibrillation on Eliquis, chronic pain, hypothyroidism, OSA, BMI 54, and asthma who presents to the emergency department with worsening shortness of breath, cough, and wheezing.She was admitted for acute respiratory failure with hypoxia from acute asthma exacerbation. She was started on IV steroids and duonebs.  Overall clinically improving still complains of wheezing and dizziness on ambulation.  She reports she does have oxygen that she can use as needed at home.

## 2022-01-12 NOTE — Progress Notes (Signed)
PROGRESS NOTE April [name]  WYO:[id number] DOB: Sep 04, 1970 DOA: 01/07/2022 PCP: [name], MD   Brief Narrative/Hospital Course: 51 y.o. female with medical history significant for hypertension, chronic diastolic CHF, atrial fibrillation on Eliquis, chronic pain, hypothyroidism, OSA, BMI 54, and asthma who presents to the emergency department with worsening shortness of breath, cough, and wheezing.She was admitted for acute respiratory failure with hypoxia from acute asthma exacerbation. She was started on IV steroids and duonebs.  Overall clinically improving still complains of wheezing and dizziness on ambulation.  She reports she does have oxygen that she can use as needed at home.    Subjective: Seen and examined this morning complains of cough, some wheezing shortness of breath dizziness on ambulating able to be weaned off oxygen.  Does not feel ready for home today. Assessment and Plan: Principal Problem:   Acute asthma exacerbation Active Problems:   HTN (hypertension)   AF (paroxysmal atrial fibrillation) (HCC)   Chronic heart failure with preserved ejection fraction (HFpEF) (HCC)   Acute respiratory failure with hypoxia (HCC)   Obstructive sleep apnea  Acute respiratory failure with hypoxia Acute asthma exacerbation: Overall clinically improving, able to come off oxygen but still short of breath when ambulating.  Reports she has supplemental oxygen that he can use as needed at home.  Does not feel ready for home today.  Continue aggressive bronchodilators, oral prednisone, seen earlier flutter valve ambulation and I-S.   Chronic diastolic heart failure: Compensated continue home Lasix and Coreg PAF rate controlled on Eliquis/Coreg Hypertension BP stable on amlodipine Coreg clonidine Lasix Chronic pain syndrome: Continue home medication/oxycodone OSA continue CPAP Leucocytosis: no fever. Suspect from steroid use.   Class II Obesity:Patient's Body mass index is 52.45 kg/m. :  Will benefit with PCP follow-up, weight loss  healthy lifestyle. US pelvis- neg,  DVT prophylaxis: eliquis Code Status:   Code Status: Full Code Family Communication: plan of care discussed with patient at bedside. Patient status is: Inpatient because of acute asthma exacerbation Level of care: Med-Surg   Dispo: The patient is from: home            Anticipated disposition: home tomorrow Objective: Vitals last 24 hrs: Vitals:   01/12/22 1102 01/12/22 1103 01/12/22 1256 01/12/22 1306  BP:   134/64   Pulse: 63 64  85  Resp:   20   Temp:   98.7 F (37.1 C)   TempSrc:   Oral   SpO2: 100% 100%  95%  Weight:      Height:       Weight change:   Physical Examination: General exam: alert awake,older than stated age, weak appearing. HEENT:Oral mucosa moist, Ear/Nose WNL grossly, dentition normal. Respiratory system: bilaterally hearing to present, mild wheezing with coughing  Cardiovascular system: S1 & S2 +, No JVD. Gastrointestinal system: Abdomen soft,NT,ND, BS+ Nervous System:Alert, awake, moving extremities and grossly nonfocal Extremities: LE edema neg,distal peripheral pulses palpable.  Skin: No rashes,no icterus. MSK: Normal muscle bulk,tone, power  Medications reviewed:  Scheduled Meds:  amLODipine  10 mg Oral Daily   apixaban  5 mg Oral BID   carvedilol  6.25 mg Oral BID   cloNIDine  0.2 mg Oral BID   DULoxetine  60 mg Oral Daily   furosemide  20 mg Oral Daily   influenza vac split quadrivalent PF  0.5 mL Intramuscular Tomorrow-1000   ipratropium-albuterol  3 mL Nebulization TID   levothyroxine  50 mcg Oral Daily   montelukast  10 mg Oral QHS  pantoprazole  40 mg Oral Daily   predniSONE  60 mg Oral QAC breakfast   pregabalin  300 mg Oral BID   topiramate  200 mg Oral QHS  Continuous Infusions:   Diet Order             Diet - low sodium heart healthy           Diet regular Room service appropriate? Yes; Fluid consistency: Thin  Diet effective now                    Intake/Output Summary (Last 24 hours) at 01/12/2022 1316 Last data filed at 01/11/2022 1806 Gross per 24 hour  Intake 709 ml  Output --  Net 709 ml   Net IO Since Admission: 1,765.31 mL [01/12/22 1316]  Wt Readings from Last 3 Encounters:  01/12/22 (!) 147.4 kg  11/28/21 (!) 153.9 kg  11/13/21 (!) 153.8 kg    Unresulted Labs (From admission, onward)    None     Data Reviewed: I have personally reviewed following labs and imaging studies CBC: Recent Labs  Lab 01/07/22 2342 01/09/22 0527 01/10/22 0544  WBC 13.5* 16.0* 17.3*  NEUTROABS 6.1  --   --   HGB 12.0 11.2* 10.9*  HCT 39.5 36.9 37.5  MCV 84.2 82.6 85.4  PLT 418* 426* 349*   Basic Metabolic Panel: Recent Labs  Lab 01/07/22 2342 01/09/22 0527 01/10/22 0544  NA 138 137 136  K 4.5 4.2 4.1  CL 110 109 109  CO2 23 21* 20*  GLUCOSE 75 166* 162*  BUN 10 11 21*  CREATININE 0.81 0.85 0.96  CALCIUM 8.7* 8.9 8.8*  MG  --  2.4  --    GFR: Estimated Creatinine Clearance: 105.8 mL/min (by C-G formula based on SCr of 0.96 mg/dL). Liver Function Tests: Recent Labs  Lab 01/07/22 2342  AST 15  ALT 12  ALKPHOS 72  BILITOT 0.6  PROT 7.8  ALBUMIN 3.6  Antimicrobials: Anti-infectives (From admission, onward)    None      Culture/Microbiology    Component Value Date/Time   SDES  10/23/2021 1826    URINE, CLEAN CATCH Performed at [facility], [place] [address] [place], [place] [address]    SPECREQUEST  10/23/2021 1826    NONE Performed at [facility], [place] [address], [place], [place] [address]    CULT MULTIPLE SPECIES PRESENT, SUGGEST RECOLLECTION (A) 10/23/2021 1826   REPTSTATUS 10/24/2021 FINAL 10/23/2021 1826  Radiology Studies: No results found.   LOS: 4 days   Antonieta Pert, MD [facility]  01/12/2022, 1:16 PM

## 2022-01-12 NOTE — Progress Notes (Signed)
Patient want to wear oxygen instead od wearing a CPAP

## 2022-01-13 NOTE — Plan of Care (Signed)

## 2022-01-13 NOTE — TOC Transition Note (Signed)
Transition of Care [facility]) - CM/SW Discharge Note   Patient Details  Name: April [name] MRN: [id number] Date of Birth: 1971/02/27  [facility] of [facility] CM/SW Contact:  Ross Ludwig, LCSW Phone Number: 01/13/2022, 11:08 AM   Clinical Narrative:     Patient will be going home with home health PT and Soc Work through [facility].  CSW signing off please reconsult with any other social work needs, home health agency has been notified of planned discharge.    Final next level of care: [place] Barriers to Discharge: Barriers Resolved   Patient Goals and CMS Choice Patient states their goals for this hospitalization and ongoing recovery are:: To return back home with home health. CMS Medicare.gov Compare Post Acute Care list provided to:: Patient Choice offered to / list presented to : Patient  Discharge Placement                    Patient and family notified of of transfer: 01/13/22  Discharge Plan and Services In-house Referral: NA Discharge Planning Services: CM Consult Post Acute Care Choice: Home Health          DME Arranged: N/A DME Agency: NA       HH Arranged: PT, Social Work [employer] Agency: [place] Date [facility] Agency Contacted: 01/10/22 Time [place]: [id number] Representative spoke with at [place]: Georgina Snell  Social Determinants of Health ([place]) Interventions Housing Interventions: Other (Comment) (Community resources provided to pt)   Readmission Risk Interventions    01/10/2022   11:28 AM 10/24/2021   12:29 PM 10/16/2021   11:39 AM  Readmission Risk Prevention Plan  Transportation Screening Complete Complete Complete  Medication Review Press photographer) Complete Complete Complete  PCP or Specialist appointment within 3-5 days of discharge Complete Complete Complete  HRI or Home Care Consult Complete Complete Complete  SW Recovery Care/Counseling Consult Complete Complete   Palliative Care Screening Not Applicable Not  [place] Not Applicable Not Applicable

## 2022-01-13 NOTE — Discharge Summary (Signed)
Physician Discharge Summary  LAVINE HARGROVE VPX:[id number] DOB: [birth year] DOA: 01/07/2022  PCP: [name], MD  Admit date: 01/07/2022 Discharge date: 01/13/2022 Recommendations for Outpatient Follow-up:  Follow up with PCP in 1 weeks-call for appointment Please obtain BMP/CBC in one week  Discharge Dispo: home Discharge Condition: Stable Code Status:   Code Status: Full Code Diet recommendation:  Diet Order             Diet - low sodium heart healthy           Diet regular Room service appropriate? Yes; Fluid consistency: Thin  Diet effective now                  Brief/Interim Summary:51 y.o. female with medical history significant for hypertension, chronic diastolic CHF, atrial fibrillation on Eliquis, chronic pain, hypothyroidism, OSA, BMI 54, and asthma who presents to the emergency department with worsening shortness of breath, cough, and wheezing.She was admitted for acute respiratory failure with hypoxia from acute asthma exacerbation. She was started on IV steroids and duonebs.  Overall clinically improving still complains of wheezing and dizziness on ambulation.  She reports she does have oxygen that she can use as needed at home.  She was able to be weaned off oxygen, patient elected for discharge 9/16 and kept additional day as she was complaining of wheezing and dizziness with ambulation.  This morning alert awake oriented resting comfortably and remains stable for discharge  Discharge Diagnoses:  Principal Problem:   Acute asthma exacerbation Active Problems:   HTN (hypertension)   AF (paroxysmal atrial fibrillation) (HCC)   Chronic heart failure with preserved ejection fraction (HFpEF) (HCC)   Acute respiratory failure with hypoxia (HCC)   Obstructive sleep apnea Acute respiratory failure with hypoxia Acute asthma exacerbation: Overall clinically improved- able to come off oxygen.Reports she has supplemental oxygen that he can use as needed at home.  Does not feel  ready for home today.  Continue prednisone taper along with home bronchodilators/inhalers.  Encouraged to follow-up with pulmonary/PCP as outpatient-she has an appointment tomorrow with pulmonary.   Chronic diastolic heart failure: Compensated continue home Lasix and Coreg PAF rate controlled on Eliquis/Coreg Hypertension BP stable on amlodipine Coreg clonidine Lasix Chronic pain syndrome: Continue home medication/oxycodone OSA continue CPAP Leucocytosis: no fever. Suspect from steroid use.    Class II Obesity:Patient's Body mass index is 52.45 kg/m. : Will benefit with PCP follow-up, weight loss  healthy lifestyle. US pelvis- neg, Consults: none Subjective: Seen and examined, alert awake oriented resting comfortably no shortness of breath or wheezing  Discharge Exam: Vitals:   01/13/22 0508 01/13/22 0753  BP: (!) 149/84   Pulse: 64   Resp: 20   Temp: 98.3 F (36.8 C)   SpO2: 100% 99%   General: Pt is alert, awake, not in acute distress Cardiovascular: RRR, S1/S2 +, no rubs, no gallops Respiratory: CTA bilaterally, no wheezing, no rhonchi Abdominal: Soft, NT, ND, bowel sounds + Extremities: no edema, no cyanosis  Discharge Instructions  Discharge Instructions     Diet - low sodium heart healthy   Complete by: As directed    Discharge instructions   Complete by: As directed    Please call call MD or return to ER for similar or worsening recurring problem that brought you to hospital or if any fever,nausea/vomiting,abdominal pain, uncontrolled pain, chest pain,  shortness of breath or any other alarming symptoms.  Please follow-up your doctor as instructed in a week time and call the office  for appointment.  Please avoid alcohol, smoking, or any other illicit substance and maintain healthy habits including taking your regular medications as prescribed.  You were cared for by a hospitalist during your hospital stay. If you have any questions about your discharge  medications or the care you received while you were in the hospital after you are discharged, you can call the unit and ask to speak with the hospitalist on call if the hospitalist that took care of you is not available.  Once you are discharged, your primary care physician will handle any further medical issues. Please note that NO REFILLS for any discharge medications will be authorized once you are discharged, as it is imperative that you return to your primary care physician (or establish a relationship with a primary care physician if you do not have one) for your aftercare needs so that they can reassess your need for medications and monitor your lab values   Increase activity slowly   Complete by: As directed    Increase activity slowly   Complete by: As directed       Allergies as of 01/13/2022       Reactions   Contrast Media [iodinated Contrast Media] Itching   CT contrast        Medication List     STOP taking these medications    arformoterol 15 MCG/2ML Nebu Commonly known as: BROVANA   atorvastatin 20 MG tablet Commonly known as: LIPITOR       TAKE these medications    albuterol 108 (90 Base) MCG/ACT inhaler Commonly known as: ProAir HFA Inhale 2 puffs into the lungs every 6 (six) hours as needed for wheezing or shortness of breath. What changed: when to take this   amLODipine 10 MG tablet Commonly known as: NORVASC TAKE 1 TABLET(10 MG) BY MOUTH DAILY What changed: See the new instructions.   apixaban 5 MG Tabs tablet Commonly known as: ELIQUIS Take 1 tablet (5 mg total) by mouth 2 (two) times daily.   baclofen 20 MG tablet Commonly known as: LIORESAL Take 20 mg by mouth daily as needed for muscle spasms (pain).   carvedilol 6.25 MG tablet Commonly known as: COREG Take 6.25 mg by mouth 2 (two) times daily.   clonazePAM 0.5 MG tablet Commonly known as: KlonoPIN Take 1 tablet (0.5 mg total) by mouth daily as needed for anxiety. What changed: when  to take this   cloNIDine 0.2 MG tablet Commonly known as: CATAPRES Take 1 tablet (0.2 mg total) by mouth 2 (two) times daily.   Combivent Respimat 20-100 MCG/ACT Aers respimat Generic drug: Ipratropium-Albuterol Inhale 1 puff into the lungs as needed for wheezing or shortness of breath.   DULoxetine 60 MG capsule Commonly known as: CYMBALTA Take 1 capsule (60 mg total) by mouth daily.   EPINEPHrine 0.3 mg/0.3 mL Soaj injection Commonly known as: EPI-PEN Inject 0.3 mg into the muscle as needed for anaphylaxis.   fluticasone 50 MCG/ACT nasal spray Commonly known as: FLONASE SHAKE LIQUID AND USE 1 SPRAY IN EACH NOSTRIL DAILY AS NEEDED FOR ALLERGIES OR RHINITIS What changed: See the new instructions.   furosemide 20 MG tablet Commonly known as: LASIX Take 1 tablet (20 mg total) by mouth daily.   levothyroxine 50 MCG tablet Commonly known as: SYNTHROID Take 1 tablet (50 mcg total) by mouth daily.   montelukast 10 MG tablet Commonly known as: SINGULAIR TAKE 1 TABLET(10 MG) BY MOUTH AT BEDTIME What changed: See the new instructions.  naloxone 4 MG/0.1ML Liqd nasal spray kit Commonly known as: NARCAN Place 1 spray into the nose once as needed (opioid overdose).   omeprazole 40 MG capsule Commonly known as: PRILOSEC Take 40 mg by mouth daily.   oxyCODONE-acetaminophen 10-325 MG tablet Commonly known as: PERCOCET Take 1 tablet by mouth 5 (five) times daily as needed (nerve pain).   Ozempic (1 MG/DOSE) 4 MG/3ML Sopn Generic drug: Semaglutide (1 MG/DOSE) Inject 1 mg into the skin once a week.   phentermine 15 MG capsule Take 15 mg by mouth daily.   potassium chloride SA 20 MEQ tablet Commonly known as: KLOR-CON M TAKE 1 TABLET(20 MEQ) BY MOUTH DAILY What changed: See the new instructions.   predniSONE 10 MG tablet Commonly known as: DELTASONE 5 tabs for 2 days, then 4 tabs for 2 days, 2 tabs for 2 days, then 2 tabs for 2 days then 1 tab for 2 days, then stop What  changed: additional instructions   pregabalin 300 MG capsule Commonly known as: LYRICA Take 300 mg by mouth in the morning, at noon, and at bedtime.   Tezspire 210 MG/1.91ML Soaj Generic drug: Tezepelumab-ekko Inject 210 mg into the skin every 28 (twenty-eight) days.   topiramate 200 MG tablet Commonly known as: TOPAMAX Take 200 mg by mouth at bedtime.   Trelegy Ellipta 100-62.5-25 MCG/ACT Aepb Generic drug: Fluticasone-Umeclidin-Vilant Inhale 1 puff into the lungs daily.   Vitamin D (Ergocalciferol) 1.25 MG (50000 UNIT) Caps capsule Commonly known as: DRISDOL Take 50,000 Units by mouth once a week.   Voltaren 1 % Gel Generic drug: diclofenac Sodium Apply 1 Application topically as needed (pain).   zolpidem 10 MG tablet Commonly known as: AMBIEN TAKE 1 TABLET BY MOUTH AT BEDTIME AS NEEDED FOR SLEEP What changed:  how much to take how to take this when to take this reasons to take this additional instructions        Follow-up Information     [name], MD Follow up in 1 week(s).   Specialty: Family Medicine Contact information: [place] [place] [address] [phone]         Sanda Klein, MD .   Specialty: Cardiology Contact information: [address] [place] [address] [place] [address] [phone]                Allergies  Allergen Reactions   Contrast Media [Iodinated Contrast Media] Itching    CT contrast    The results of significant diagnostics from this hospitalization (including imaging, microbiology, ancillary and laboratory) are listed below for reference.    Microbiology: Recent Results (from the past 240 hour(s))  Resp Panel by RT-PCR (Flu A&B, Covid) Anterior Nasal Swab     Status: None   Collection Time: 01/08/22 12:11 AM   Specimen: Anterior Nasal Swab  Result Value Ref Range Status   SARS Coronavirus 2 by RT PCR NEGATIVE NEGATIVE Final    Comment: (NOTE) SARS-CoV-2 target nucleic acids  are NOT DETECTED.  The SARS-CoV-2 RNA is generally detectable in upper respiratory specimens during the acute phase of infection. The lowest concentration of SARS-CoV-2 viral copies this assay can detect is 138 copies/mL. A negative result does not preclude SARS-Cov-2 infection and should not be used as the sole basis for treatment or other patient management decisions. A negative result may occur with  improper specimen collection/handling, submission of specimen other than nasopharyngeal swab, presence of viral mutation(s) within the areas targeted by this assay, and inadequate number  of viral copies(<138 copies/mL). A negative result must be combined with clinical observations, patient history, and epidemiological information. The expected result is Negative.  Fact Sheet for Patients:  EntrepreneurPulse.com.au  Fact Sheet for Healthcare Providers:  IncredibleEmployment.be  This test is no t yet approved or cleared by the [place] FDA and  has been authorized for detection and/or diagnosis of SARS-CoV-2 by FDA under an Emergency Use Authorization (EUA). This EUA will remain  in effect (meaning this test can be used) for the duration of the COVID-19 declaration under Section 564(b)(1) of the Act, 21 U.S.C.section 360bbb-3(b)(1), unless the authorization is terminated  or revoked sooner.       Influenza A by PCR NEGATIVE NEGATIVE Final   Influenza B by PCR NEGATIVE NEGATIVE Final    Comment: (NOTE) The Xpert Xpress SARS-CoV-2/FLU/RSV plus assay is intended as an aid in the diagnosis of influenza from Nasopharyngeal swab specimens and should not be used as a sole basis for treatment. Nasal washings and aspirates are unacceptable for Xpert Xpress SARS-CoV-2/FLU/RSV testing.  Fact Sheet for Patients: EntrepreneurPulse.com.au  Fact Sheet for Healthcare Providers: IncredibleEmployment.be  This test is  not yet approved or cleared by the [place] FDA and has been authorized for detection and/or diagnosis of SARS-CoV-2 by FDA under an Emergency Use Authorization (EUA). This EUA will remain in effect (meaning this test can be used) for the duration of the COVID-19 declaration under Section 564(b)(1) of the Act, 21 U.S.C. section 360bbb-3(b)(1), unless the authorization is terminated or revoked.  Performed at [facility], [place] [address], [place], [place] [address]   Respiratory (~20 pathogens) panel by PCR     Status: None   Collection Time: 01/08/22  7:58 AM   Specimen: Nasopharyngeal Swab; Respiratory  Result Value Ref Range Status   Adenovirus NOT DETECTED NOT DETECTED Final   Coronavirus 229E NOT DETECTED NOT DETECTED Final    Comment: (NOTE) The Coronavirus on the Respiratory Panel, DOES NOT test for the novel  Coronavirus (2019 nCoV)    Coronavirus HKU1 NOT DETECTED NOT DETECTED Final   Coronavirus NL63 NOT DETECTED NOT DETECTED Final   Coronavirus OC43 NOT DETECTED NOT DETECTED Final   Metapneumovirus NOT DETECTED NOT DETECTED Final   Rhinovirus / Enterovirus NOT DETECTED NOT DETECTED Final   Influenza A NOT DETECTED NOT DETECTED Final   Influenza B NOT DETECTED NOT DETECTED Final   Parainfluenza Virus 1 NOT DETECTED NOT DETECTED Final   Parainfluenza Virus 2 NOT DETECTED NOT DETECTED Final   Parainfluenza Virus 3 NOT DETECTED NOT DETECTED Final   Parainfluenza Virus 4 NOT DETECTED NOT DETECTED Final   Respiratory Syncytial Virus NOT DETECTED NOT DETECTED Final   Bordetella pertussis NOT DETECTED NOT DETECTED Final   Bordetella Parapertussis NOT DETECTED NOT DETECTED Final   Chlamydophila pneumoniae NOT DETECTED NOT DETECTED Final   Mycoplasma pneumoniae NOT DETECTED NOT DETECTED Final    Comment: Performed at [facility], [place]. [address] [place],  [address]    Procedures/Studies: US PELVIS (TRANSABDOMINAL ONLY)  Result Date:  01/10/2022 CLINICAL DATA:  Pelvic pain EXAM: TRANSABDOMINAL ULTRASOUND OF PELVIS TECHNIQUE: Transabdominal ultrasound examination of the pelvis was performed including evaluation of the uterus, ovaries, adnexal regions, and pelvic cul-de-sac. COMPARISON:  CT abdomen 10/23/2021 FINDINGS: Uterus Measurements: 8.3 x 5.6 x 6.7 cm = volume: 161.7 mL. No fibroids or other mass visualized. Endometrium Thickness: 11 mm.  No focal abnormality visualized. Right ovary Measurements: 3.9 x 2.9 x 4.1 cm =  volume: 24.1 mL. Normal appearance/no adnexal mass. Left ovary Not visualized. Other findings:  No abnormal free fluid. IMPRESSION: 1. No acute pelvic abnormality. No findings to explain the patient's pelvic pain. 2. Left ovary is not visualized. Electronically Signed   By: Kathreen Devoid M.D.   On: 01/10/2022 12:49   DG HIP UNILAT WITH PELVIS 2-3 VIEWS LEFT  Result Date: 01/09/2022 CLINICAL DATA:  Left hip pain.  No known injury. EXAM: DG HIP (WITH OR WITHOUT PELVIS) 2-3V LEFT COMPARISON:  CT abdomen and pelvis 10/23/2021 FINDINGS: The bilateral femoroacetabular and sacroiliac joint spaces are maintained. Mild pubic symphysis subchondral sclerosis and cystic degenerative change. No acute fracture or dislocation. IMPRESSION: Mild pubic symphysis osteoarthritis. Electronically Signed   By: Yvonne Kendall M.D.   On: 01/09/2022 12:17   DG Chest Port 1 View  Result Date: 01/08/2022 CLINICAL DATA:  Dyspnea, shortness of breath EXAM: PORTABLE CHEST 1 VIEW COMPARISON:  10/23/2021 FINDINGS: The heart size and mediastinal contours are within normal limits. Both lungs are clear. The visualized skeletal structures are unremarkable. IMPRESSION: No active disease. Electronically Signed   By: Donavan Foil M.D.   On: 01/08/2022 00:00    Labs: BNP (last 3 results) Recent Labs    10/13/21 1253 10/23/21 1632 01/07/22 2342  BNP 66.3 18.9 76.8   Basic Metabolic Panel: Recent Labs  Lab 01/07/22 2342 01/09/22 0527 01/10/22 0544   NA 138 137 136  K 4.5 4.2 4.1  CL 110 109 109  CO2 23 21* 20*  GLUCOSE 75 166* 162*  BUN 10 11 21*  CREATININE 0.81 0.85 0.96  CALCIUM 8.7* 8.9 8.8*  MG  --  2.4  --    Liver Function Tests: Recent Labs  Lab 01/07/22 2342  AST 15  ALT 12  ALKPHOS 72  BILITOT 0.6  PROT 7.8  ALBUMIN 3.6   No results for input(s): "LIPASE", "AMYLASE" in the last 168 hours. No results for input(s): "AMMONIA" in the last 168 hours. CBC: Recent Labs  Lab 01/07/22 2342 01/09/22 0527 01/10/22 0544  WBC 13.5* 16.0* 17.3*  NEUTROABS 6.1  --   --   HGB 12.0 11.2* 10.9*  HCT 39.5 36.9 37.5  MCV 84.2 82.6 85.4  PLT 418* 426* 417*   Cardiac Enzymes: No results for input(s): "CKTOTAL", "CKMB", "CKMBINDEX", "TROPONINI" in the last 168 hours. BNP: Invalid input(s): "POCBNP" CBG: No results for input(s): "GLUCAP" in the last 168 hours. D-Dimer No results for input(s): "DDIMER" in the last 72 hours. Hgb A1c No results for input(s): "HGBA1C" in the last 72 hours. Lipid Profile No results for input(s): "CHOL", "HDL", "LDLCALC", "TRIG", "CHOLHDL", "LDLDIRECT" in the last 72 hours. Thyroid function studies No results for input(s): "TSH", "T4TOTAL", "T3FREE", "THYROIDAB" in the last 72 hours.  Invalid input(s): "FREET3" Anemia work up No results for input(s): "VITAMINB12", "FOLATE", "FERRITIN", "TIBC", "IRON", "RETICCTPCT" in the last 72 hours. Urinalysis    Component Value Date/Time   COLORURINE YELLOW 10/23/2021 1826   APPEARANCEUR CLOUDY (A) 10/23/2021 1826   LABSPEC 1.027 10/23/2021 1826   PHURINE 5.0 10/23/2021 1826   GLUCOSEU NEGATIVE 10/23/2021 1826   GLUCOSEU NEG mg/dL 10/28/2007 2049   HGBUR NEGATIVE 10/23/2021 1826   BILIRUBINUR NEGATIVE 10/23/2021 1826   KETONESUR NEGATIVE 10/23/2021 1826   PROTEINUR NEGATIVE 10/23/2021 1826   UROBILINOGEN 1.0 11/21/2014 0707   NITRITE NEGATIVE 10/23/2021 1826   LEUKOCYTESUR LARGE (A) 10/23/2021 1826   Sepsis Labs Recent Labs  Lab  01/07/22 2342 01/09/22 0527 01/10/22 0544  WBC 13.5* 16.0* 17.3*   Microbiology Recent Results (from the past 240 hour(s))  Resp Panel by RT-PCR (Flu A&B, Covid) Anterior Nasal Swab     Status: None   Collection Time: 01/08/22 12:11 AM   Specimen: Anterior Nasal Swab  Result Value Ref Range Status   SARS Coronavirus 2 by RT PCR NEGATIVE NEGATIVE Final    Comment: (NOTE) SARS-CoV-2 target nucleic acids are NOT DETECTED.  The SARS-CoV-2 RNA is generally detectable in upper respiratory specimens during the acute phase of infection. The lowest concentration of SARS-CoV-2 viral copies this assay can detect is 138 copies/mL. A negative result does not preclude SARS-Cov-2 infection and should not be used as the sole basis for treatment or other patient management decisions. A negative result may occur with  improper specimen collection/handling, submission of specimen other than nasopharyngeal swab, presence of viral mutation(s) within the areas targeted by this assay, and inadequate number of viral copies(<138 copies/mL). A negative result must be combined with clinical observations, patient history, and epidemiological information. The expected result is Negative.  Fact Sheet for Patients:  EntrepreneurPulse.com.au  Fact Sheet for Healthcare Providers:  IncredibleEmployment.be  This test is no t yet approved or cleared by the [place] FDA and  has been authorized for detection and/or diagnosis of SARS-CoV-2 by FDA under an Emergency Use Authorization (EUA). This EUA will remain  in effect (meaning this test can be used) for the duration of the COVID-19 declaration under Section 564(b)(1) of the Act, 21 U.S.C.section 360bbb-3(b)(1), unless the authorization is terminated  or revoked sooner.       Influenza A by PCR NEGATIVE NEGATIVE Final   Influenza B by PCR NEGATIVE NEGATIVE Final    Comment: (NOTE) The Xpert Xpress  SARS-CoV-2/FLU/RSV plus assay is intended as an aid in the diagnosis of influenza from Nasopharyngeal swab specimens and should not be used as a sole basis for treatment. Nasal washings and aspirates are unacceptable for Xpert Xpress SARS-CoV-2/FLU/RSV testing.  Fact Sheet for Patients: EntrepreneurPulse.com.au  Fact Sheet for Healthcare Providers: IncredibleEmployment.be  This test is not yet approved or cleared by the [place] FDA and has been authorized for detection and/or diagnosis of SARS-CoV-2 by FDA under an Emergency Use Authorization (EUA). This EUA will remain in effect (meaning this test can be used) for the duration of the COVID-19 declaration under Section 564(b)(1) of the Act, 21 U.S.C. section 360bbb-3(b)(1), unless the authorization is terminated or revoked.  Performed at [facility], [place] [address], [place],  [address]   Respiratory (~20 pathogens) panel by PCR     Status: None   Collection Time: 01/08/22  7:58 AM   Specimen: Nasopharyngeal Swab; Respiratory  Result Value Ref Range Status   Adenovirus NOT DETECTED NOT DETECTED Final   Coronavirus 229E NOT DETECTED NOT DETECTED Final    Comment: (NOTE) The Coronavirus on the Respiratory Panel, DOES NOT test for the novel  Coronavirus (2019 nCoV)    Coronavirus HKU1 NOT DETECTED NOT DETECTED Final   Coronavirus NL63 NOT DETECTED NOT DETECTED Final   Coronavirus OC43 NOT DETECTED NOT DETECTED Final   Metapneumovirus NOT DETECTED NOT DETECTED Final   Rhinovirus / Enterovirus NOT DETECTED NOT DETECTED Final   Influenza A NOT DETECTED NOT DETECTED Final   Influenza B NOT DETECTED NOT DETECTED Final   Parainfluenza Virus 1 NOT DETECTED NOT DETECTED Final   Parainfluenza Virus 2 NOT DETECTED NOT DETECTED Final   Parainfluenza Virus 3 NOT DETECTED NOT DETECTED Final  Parainfluenza Virus 4 NOT DETECTED NOT DETECTED Final   Respiratory Syncytial  Virus NOT DETECTED NOT DETECTED Final   Bordetella pertussis NOT DETECTED NOT DETECTED Final   Bordetella Parapertussis NOT DETECTED NOT DETECTED Final   Chlamydophila pneumoniae NOT DETECTED NOT DETECTED Final   Mycoplasma pneumoniae NOT DETECTED NOT DETECTED Final    Comment: Performed at [facility], [place] [address], [place], [place] [address]     Time coordinating discharge: 25 minutes  SIGNED: Antonieta Pert, MD  [facility] 01/13/2022, 9:46 AM  If 7PM-7AM, please contact night-coverage [email or web address]

## 2022-01-14 ENCOUNTER — Telehealth: Payer: Self-pay

## 2022-01-14 ENCOUNTER — Other Ambulatory Visit (HOSPITAL_COMMUNITY): Payer: Self-pay

## 2022-01-14 NOTE — Telephone Encounter (Signed)
Transition Care Management Follow-up Telephone Call Date of discharge and from where: 01/13/2022, [facility] How have you been since you were released from the hospital? She said she feels a little weak.  Any questions or concerns? Yes- she has home O2 that she uses when needed but she stated that her housemates smoke and they don't go outside to smoke so she is hesitant to use the O2.  She would like to have a place of her own and has started to explore her options.  Current income $1400/month.  She stated that a friend told her about [place] pays half of that person's rent. I explained to her that the friend may be referring to the [facility] program but in order to qualify for that, she needs to be receiving behavioral health support for a severe persistent mental illness.  She then said that she is starting to see a behavioral health counselor- appt 9/202/230. I instructed her to discuss this with her behavioral health clinician and she can also call [facility] for more information.  She is requesting a raised toilet seat with arms. I told her that it would be best to have the home PT assess her DME needs and to contact this office with recommendations.  Items Reviewed: Did the pt receive and understand the discharge instructions provided? Yes  Medications obtained and verified? Yes - she said that she has all of her medications.  Other? No  Any new allergies since your discharge? No  Dietary orders reviewed? No Do you have support at home? Yes   Home Care and Equipment/Supplies: Were home health services ordered? yes If so, what is the name of the agency? [facility]  Has the agency set up a time to come to the patient's home? no Were any new equipment or medical supplies ordered?  No What is the name of the medical supply agency? N/a Were you able to get the supplies/equipment? not applicable Do you have any questions related to the use of the equipment or supplies?  No  Functional Questionnaire: (I = Independent and D = Dependent) ADLs: independent She has not heard from [facility] about the PCS eval.    Follow up appointments reviewed:  PCP Hospital f/u appt confirmed? Yes  Scheduled to see Dr Margarita Rana- 01/30/2022,   [facility] f/u appt confirmed? Yes  Scheduled to see pulmonary - 01/28/2022.  Are transportation arrangements needed? No  If their condition worsens, is the pt aware to call PCP or go to the Emergency Dept.? Yes Was the patient provided with contact information for the PCP's office or ED? Yes Was to pt encouraged to call back with questions or concerns? Yes

## 2022-01-14 NOTE — Telephone Encounter (Signed)
Left vociemail for patient to call office back if she is still needing our office. Nothing further needed. Closing encounter since we have tried to contact multiple times.

## 2022-01-14 NOTE — Telephone Encounter (Signed)
From the discharge call:  She said she feels a little weak.   she has home O2 that she uses when needed but she stated that her housemates smoke and they don't go outside to smoke so she is hesitant to use the O2.  She would like to have a place of her own and has started to explore her options.  Current income $1400/month.   She stated that a friend told her about [facility] pays half of that person's rent. I explained to her that the friend may be referring to the [facility] program but in order to qualify for that, she needs to be receiving behavioral health support for a severe persistent mental illness.  She then said that she is starting to see a behavioral health counselor- appt 9/202/230. I instructed her to discuss this with her behavioral health clinician and she can also call [facility] for more information.   She is requesting a raised toilet seat with arms. I told her that it would be best to have the home PT assess her DME needs and to contact this office with recommendations.  Scheduled to see Dr Margarita Rana- 01/30/2022,

## 2022-01-16 ENCOUNTER — Ambulatory Visit (INDEPENDENT_AMBULATORY_CARE_PROVIDER_SITE_OTHER): Payer: Medicare Other | Admitting: Licensed Clinical Social Worker

## 2022-01-16 ENCOUNTER — Other Ambulatory Visit (HOSPITAL_COMMUNITY): Payer: Self-pay

## 2022-01-16 DIAGNOSIS — F411 Generalized anxiety disorder: Secondary | ICD-10-CM | POA: Diagnosis not present

## 2022-01-16 DIAGNOSIS — F331 Major depressive disorder, recurrent, moderate: Secondary | ICD-10-CM | POA: Diagnosis not present

## 2022-01-17 ENCOUNTER — Other Ambulatory Visit (HOSPITAL_COMMUNITY): Payer: Self-pay

## 2022-01-17 ENCOUNTER — Encounter (HOSPITAL_COMMUNITY): Payer: Self-pay
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
## 2022-01-17 NOTE — Plan of Care (Signed)
  Problem: Depression Goal:  Decrease depressive symptoms and improve levels of effective functioning-pt reports a decrease in overall depression symptoms 3 out of 5 sessions documented.  Outcome: Initial Goal: Develop healthy thinking patterns and beliefs about self, others, and the world that lead to the alleviation and help prevent the relapse of depression per self report 3 out of 5 sessions documented.   Outcome: Initial   Problem: Anxiety  Goal: Reduce overall frequency, intensity, and duration of the anxiety so that daily functioning is not impaired per pt self report 3 out of 5 sessions documented.   Outcome: Initial Goal: Learn and implement coping skills that result in a reduction of anxiety and worry, and improve daily functioning per pt report 3 out of 5 sessions documented  Outcome: Initial    Developed/revised tx plan based on pt self reported input. Pt verbally agrees with treatment plan at time of session and consents to sign digitally.

## 2022-01-18 ENCOUNTER — Telehealth: Payer: Self-pay | Admitting: Emergency Medicine

## 2022-01-18 NOTE — Telephone Encounter (Signed)
NOTED

## 2022-01-18 NOTE — Telephone Encounter (Signed)
Copied from [place] [id number]. Topic: General - Other >> Jan 18, 2022  9:47 AM Everette C wrote: Clair Gulling with [facility] has called to share that the patient has cancelled their home health visit for PT today 01/18/22  The patient would like to be seen next week, ideally 9/25 or 9/26   Please contact further if needed

## 2022-01-23 ENCOUNTER — Telehealth: Payer: Self-pay | Admitting: Emergency Medicine

## 2022-01-23 NOTE — Telephone Encounter (Signed)
Copied from [place] [id number]. Topic: General - Inquiry >> Jan 23, 2022  1:32 PM Devoria Glassing wrote: Reason for CRM:  Judeen Hammans from [facility] calling to confirm you receive df ac from yesterday.  She is informing pcp of her GAD and PHQ.   Pt also requesting a bedside commode. [phone]

## 2022-01-24 NOTE — Telephone Encounter (Signed)
Paperwork has been received and placed in PCP folder to review.  Ok to write script for bedside commode, if so what Dx would I use.

## 2022-01-25 ENCOUNTER — Other Ambulatory Visit: Payer: Self-pay

## 2022-01-25 DIAGNOSIS — M1711 Unilateral primary osteoarthritis, right knee: Secondary | ICD-10-CM

## 2022-01-25 MED ORDER — MISC. DEVICES MISC: 0 refills | Status: DC

## 2022-01-25 NOTE — Telephone Encounter (Signed)
Script has been written and will be faxed to [facility] on Monday when PCP is back in office for signature.

## 2022-01-25 NOTE — Telephone Encounter (Signed)
Right knee osteoarthritis.  Thanks
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
## 2022-01-28 ENCOUNTER — Encounter: Payer: Self-pay | Admitting: Internal Medicine

## 2022-01-28 ENCOUNTER — Ambulatory Visit (INDEPENDENT_AMBULATORY_CARE_PROVIDER_SITE_OTHER): Payer: Medicare Other | Admitting: Internal Medicine

## 2022-01-28 VITALS — BP 144/86 | HR 112 | Ht 66.0 in | Wt 333.6 lb

## 2022-01-28 DIAGNOSIS — Z23 Encounter for immunization: Secondary | ICD-10-CM

## 2022-01-28 DIAGNOSIS — J4541 Moderate persistent asthma with (acute) exacerbation: Secondary | ICD-10-CM

## 2022-01-28 DIAGNOSIS — F411 Generalized anxiety disorder: Secondary | ICD-10-CM

## 2022-01-28 MED ORDER — IPRATROPIUM-ALBUTEROL 0.5-2.5 (3) MG/3ML IN SOLN: RESPIRATORY_TRACT | 12 refills | Status: DC

## 2022-01-28 MED ORDER — ZOLPIDEM TARTRATE 10 MG PO TABS: ORAL_TABLET | ORAL | 5 refills | Status: DC

## 2022-01-28 MED ORDER — PREDNISONE 10 MG PO TABS: ORAL_TABLET | ORAL | 2 refills | Status: DC

## 2022-01-28 NOTE — Patient Instructions (Addendum)
Script sent for prednisone- use only if needed as discussed  Script sent for Duoneb nebulizer solution  Order- flu vax standard  Stop using Singulair/ montelukast for now. It can make some people feel depressed, so see how you feel without it.

## 2022-01-30 ENCOUNTER — Ambulatory Visit: Payer: Medicare Other | Attending: Family Medicine | Admitting: Family Medicine

## 2022-01-30 ENCOUNTER — Encounter: Payer: Self-pay | Admitting: Internal Medicine

## 2022-01-30 ENCOUNTER — Encounter: Payer: Self-pay | Admitting: Family Medicine

## 2022-01-30 ENCOUNTER — Telehealth: Payer: Self-pay

## 2022-01-30 VITALS — BP 118/88 | HR 72 | Ht 66.0 in | Wt 335.0 lb

## 2022-01-30 DIAGNOSIS — M1711 Unilateral primary osteoarthritis, right knee: Secondary | ICD-10-CM | POA: Diagnosis not present

## 2022-01-30 DIAGNOSIS — Z0000 Encounter for general adult medical examination without abnormal findings: Secondary | ICD-10-CM | POA: Diagnosis not present

## 2022-01-30 DIAGNOSIS — R141 Gas pain: Secondary | ICD-10-CM

## 2022-01-30 DIAGNOSIS — Z1231 Encounter for screening mammogram for malignant neoplasm of breast: Secondary | ICD-10-CM

## 2022-01-30 MED ORDER — MISC. DEVICES MISC: 0 refills | Status: DC

## 2022-01-30 NOTE — Assessment & Plan Note (Signed)
Discussed again, particularly in the context of prednisone therapy.

## 2022-01-30 NOTE — Assessment & Plan Note (Signed)
Montelukast has been associated with depression in some individuals.  We are going to have her stop it for now to see if we can detect any change in asthma control or mood.

## 2022-01-30 NOTE — Progress Notes (Signed)
Subjective:   April [name] is a 51 y.o. female who presents for Medicare Annual (Subsequent) preventive examination. She is supposed to be on 4-5 litres of oxygen at night but states her tanks are yet to be delivered to her.  She complains of a foul smell when she burps and omeprazole has been ineffective so she discontinued it and has been taking famotidine. States she is up-to-date on colonoscopy which she had at [facility] to wheeze and complains of those around her smoke which has been difficult with regards to her health.  She saw her pulmonologist 2 days ago and received a prescription for prednisone.  She is currently under a lot of stress as her mom is hospitalized and she is trying to look for a new apartment.   Review of Systems    General: negative for fever, weight loss, appetite change Eyes: no visual symptoms. ENT: no ear symptoms, no sinus tenderness, no nasal congestion or sore throat. Neck: no pain  Respiratory: +wheezing, +shortness of breath, +cough Cardiovascular: no chest pain, +dyspnea on exertion, no pedal edema, no orthopnea. Gastrointestinal: no abdominal pain, no diarrhea, no constipation Genito-Urinary: no urinary frequency, no dysuria, no polyuria. Hematologic: no bruising Endocrine: no cold or heat intolerance Neurological: no headaches, no seizures, no tremors Musculoskeletal: no joint pains, no joint swelling Skin: no pruritus, no rash. Psychological: +depression, +anxiety,   Cardiac Risk Factors include: none     Objective:    Today's Vitals   01/30/22 1520 01/30/22 1521  BP: 118/88   Pulse: 72   SpO2: 98%   Weight: (!) 335 lb (152 kg)   Height: '5\' 6"'$  (1.676 m)   PainSc:  8    Body mass index is 54.07 kg/m.     01/30/2022    3:23 PM 01/08/2022    1:51 PM 01/07/2022   11:27 PM 10/23/2021    4:06 PM 10/08/2021    5:10 PM 08/26/2021    3:38 PM 08/24/2021   12:00 AM  Advanced Directives  Does Patient Have a Medical  Advance Directive? No  No No No No No  Would patient like information on creating a medical advance directive? No - Patient declined Yes (Inpatient - patient requests chaplain consult to create a medical advance directive)  No - Patient declined No - Patient declined No - Patient declined No - Patient declined    Current Medications (verified) Outpatient Encounter Medications as of 01/30/2022  Medication Sig   albuterol (PROAIR HFA) 108 (90 Base) MCG/ACT inhaler Inhale 2 puffs into the lungs every 6 (six) hours as needed for wheezing or shortness of breath. (Patient taking differently: Inhale 2 puffs into the lungs 3 (three) times daily as needed for wheezing or shortness of breath.)   amLODipine (NORVASC) 10 MG tablet TAKE 1 TABLET(10 MG) BY MOUTH DAILY (Patient taking differently: Take 10 mg by mouth daily.)   apixaban (ELIQUIS) 5 MG TABS tablet Take 1 tablet (5 mg total) by mouth 2 (two) times daily.   baclofen (LIORESAL) 20 MG tablet Take 20 mg by mouth daily as needed for muscle spasms (pain).   carvedilol (COREG) 6.25 MG tablet Take 6.25 mg by mouth 2 (two) times daily.   clonazePAM (KLONOPIN) 0.5 MG tablet Take 1 tablet (0.5 mg total) by mouth daily as needed for anxiety. (Patient taking differently: Take 0.5 mg by mouth 2 (two) times daily as needed for anxiety.)   cloNIDine (CATAPRES) 0.2 MG tablet Take 1 tablet (0.2 mg  total) by mouth 2 (two) times daily.   COMBIVENT RESPIMAT 20-100 MCG/ACT AERS respimat Inhale 1 puff into the lungs as needed for wheezing or shortness of breath.   diclofenac Sodium (VOLTAREN) 1 % GEL Apply 1 Application topically as needed (pain).   DULoxetine (CYMBALTA) 60 MG capsule Take 1 capsule (60 mg total) by mouth daily.   EPINEPHrine 0.3 mg/0.3 mL IJ SOAJ injection Inject 0.3 mg into the muscle as needed for anaphylaxis.   fluticasone (FLONASE) 50 MCG/ACT nasal spray SHAKE LIQUID AND USE 1 SPRAY IN EACH NOSTRIL DAILY AS NEEDED FOR ALLERGIES OR RHINITIS (Patient  taking differently: Place 1 spray into both nostrils daily.)   Fluticasone-Umeclidin-Vilant 100-62.5-25 MCG/ACT AEPB Inhale 1 puff into the lungs daily.   furosemide (LASIX) 20 MG tablet Take 1 tablet (20 mg total) by mouth daily.   ipratropium-albuterol (DUONEB) 0.5-2.5 (3) MG/3ML SOLN 1 neb every 6 hours if needed for asthma   levothyroxine (SYNTHROID) 50 MCG tablet Take 1 tablet (50 mcg total) by mouth daily.   Misc. Devices MISC Bedside commode   naloxone (NARCAN) nasal spray 4 mg/0.1 mL Place 1 spray into the nose once as needed (opioid overdose).   omeprazole (PRILOSEC) 40 MG capsule Take 40 mg by mouth daily.   oxyCODONE-acetaminophen (PERCOCET) 10-325 MG tablet Take 1 tablet by mouth 5 (five) times daily as needed (nerve pain).   phentermine 15 MG capsule Take 15 mg by mouth daily.   potassium chloride SA (KLOR-CON M) 20 MEQ tablet TAKE 1 TABLET(20 MEQ) BY MOUTH DAILY (Patient taking differently: Take 20 mEq by mouth daily.)   predniSONE (DELTASONE) 10 MG tablet 1 daily if needed   pregabalin (LYRICA) 300 MG capsule Take 300 mg by mouth in the morning, at noon, and at bedtime.   Semaglutide, 1 MG/DOSE, (OZEMPIC, 1 MG/DOSE,) 4 MG/3ML SOPN Inject 1 mg into the skin once a week.   Tezepelumab-ekko (TEZSPIRE) 210 MG/1.91ML SOAJ Inject 210 mg into the skin every 28 (twenty-eight) days.   topiramate (TOPAMAX) 200 MG tablet Take 200 mg by mouth at bedtime.   Vitamin D, Ergocalciferol, (DRISDOL) 1.25 MG (50000 UNIT) CAPS capsule Take 50,000 Units by mouth once a week.   zolpidem (AMBIEN) 10 MG tablet TAKE 1 TABLET BY MOUTH AT BEDTIME AS NEEDED FOR SLEEP   [DISCONTINUED] mometasone-formoterol (DULERA) 200-5 MCG/ACT AERO Inhale 2 puffs into the lungs 2 (two) times daily. Dx: Asthma   No facility-administered encounter medications on file as of 01/30/2022.    Allergies (verified) Contrast media [iodinated contrast media]   History: Past Medical History:  Diagnosis Date   Acanthosis nigricans     Anxiety    Arthritis    "knees" (04/28/2017)   Asthma    Followed by Dr. Annamaria Boots (pulmonology); receives every other week omalizumab injections; has frequent exacerbations   Back pain    Chronic diastolic CHF (congestive heart failure) ([place]) 01/17/2017   COPD (chronic obstructive pulmonary disease) ([place])    PFTs in 2002, FEV1/FVC 65, no post bronchodilater test done   Depression    GERD (gastroesophageal reflux disease)    Headache(784.0)    "q couple days" (03/50/0938)   Helicobacter pylori (H. pylori) infection    Hypertension, essential    Insomnia    Joint pain    Lower extremity edema    Menorrhagia    Morbid obesity ([place])    OSA on CPAP    Sleep study 2008 - mild OSA, not enough events to titrate CPAP; wears CPAP now/pt  on 04/28/2017   Pneumonia X 1   Prediabetes    Rheumatoid arthritis (HCC)    Seasonal allergies    Shortness of breath    Tobacco user    Vitamin D deficiency    Past Surgical History:  Procedure Laterality Date   CARDIOVERSION N/A 05/30/2017   Procedure: CARDIOVERSION;  Surgeon: Sanda Klein, MD;  Location: [facility];  Service: Cardiovascular;  Laterality: N/A;   REDUCTION MAMMAPLASTY Bilateral 09/2011   TUBAL LIGATION  1996   bilateral   Family History  Problem Relation Age of Onset   Hypertension Mother    Asthma Daughter    Cancer Paternal Aunt    Asthma Maternal Grandmother    Social History   Socioeconomic History   Marital status: Single    Spouse name: Not on file   Number of children: 1   Years of education: Not on file   Highest education level: Not on file  Occupational History   Occupation: stay at home  Tobacco Use   Smoking status: Former    Packs/day: 0.50    Years: 26.00    Total pack years: 13.00    Types: Cigarettes    Quit date: 09/12/2014    Years since quitting: 7.3   Smokeless tobacco: Never   Tobacco comments:    Has vaped before, caught in the hospital  Vaping Use   Vaping Use: Never used  Substance  and Sexual Activity   Alcohol use: No   Drug use: No   Sexual activity: Never  Other Topics Concern   Not on file  Social History Narrative   Daughters are grown, not married, works as a Quarry manager.   Social Determinants of Health   Financial Resource Strain: High Risk (01/30/2022)   Overall Financial Resource Strain (CARDIA)    Difficulty of Paying Living Expenses: Very hard  Food Insecurity: No Food Insecurity (01/08/2022)   Hunger Vital Sign    Worried About Running Out of Food in the Last Year: Never true    Ran Out of Food in the Last Year: Never true  Transportation Needs: No Transportation Needs (01/08/2022)   PRAPARE - Hydrologist (Medical): No    Lack of Transportation (Non-Medical): No  Physical Activity: Insufficiently Active (01/30/2022)   Exercise Vital Sign    Days of Exercise per Week: 2 days    Minutes of Exercise per Session: 30 min  Stress: Stress Concern Present (01/30/2022)   [place]    Feeling of Stress : Very much  Social Connections: Socially Isolated (01/30/2022)   Social Connection and Isolation Panel [NHANES]    Frequency of Communication with Friends and Family: More than three times a week    Frequency of Social Gatherings with Friends and Family: Three times a week    Attends Religious Services: Never    Active Member of Clubs or Organizations: No    Attends Music therapist: Never    Marital Status: Never married    Tobacco Counseling Counseling given: Not Answered Tobacco comments: Has vaped before, caught in the hospital   Clinical Intake:  Pre-visit preparation completed: No  Pain : 0-10 Pain Score: 8      Diabetes: Yes CBG done?: No  How often do you need to have someone help you when you read instructions, pamphlets, or other written materials from your doctor or pharmacy?: 1 - Never  Diabetic?Yes  Interpreter Needed?: No  Activities of Daily Living    01/30/2022    3:24 PM 01/08/2022    1:00 PM  In your present state of health, do you have any difficulty performing the following activities:  Hearing? 0 0  Vision? 0 0  Difficulty concentrating or making decisions? 1 0  Walking or climbing stairs? 1 1  Dressing or bathing? 1 0  Doing errands, shopping? 0 1  Preparing Food and eating ? Y   Using the Toilet? N   In the past six months, have you accidently leaked urine? Y   Do you have problems with loss of bowel control? Y   Managing your Medications? N   Managing your Finances? N   Housekeeping or managing your Housekeeping? Y     Patient Care Team: [name], MD as PCP - General (Family Medicine) Croitoru, Dani Gobble, MD as PCP - Cardiology (Cardiology)  Indicate any recent Medical Services you may have received from other than [facility] providers in the past year (date may be approximate).     Assessment:   This is a routine wellness examination for [place].  Hearing/Vision screen No results found.  Dietary issues and exercise activities discussed: Current Exercise Habits: Home exercise routine, Type of exercise: walking, Time (Minutes): 20, Frequency (Times/Week): 2, Weekly Exercise (Minutes/Week): 40, Intensity: Mild, Exercise limited by: None identified   Goals Addressed   None    Depression Screen    01/19/2022    4:12 PM 11/28/2021   12:24 PM 06/01/2020   10:05 AM 03/30/2020   10:40 AM 12/30/2019    4:28 PM 08/05/2019   11:13 AM 03/24/2019    3:28 PM  PHQ 2/9 Scores  PHQ - 2 Score  '5 5 4 3 3 '$ 0  PHQ- 9 Score  '12 13 13 9 5 '$ 0     Information is confidential and restricted. Go to Review Flowsheets to unlock data.    Fall Risk    01/30/2022    3:23 PM 11/28/2021   11:56 AM 02/20/2021    4:32 PM 03/30/2020   10:37 AM 02/23/2020    2:03 PM  Fall Risk   Falls in the past year? 0 0 0 0 0  Number falls in past yr: 0 0 0 0   Injury with Fall? 0 0 0 0   Risk for fall due to : No Fall Risks         FALL RISK PREVENTION PERTAINING TO THE HOME:  Any stairs in or around the home? No  If so, are there any without handrails? No  Home free of loose throw rugs in walkways, pet beds, electrical cords, etc? Yes  Adequate lighting in your home to reduce risk of falls? Yes   ASSISTIVE DEVICES UTILIZED TO PREVENT FALLS:  Life alert? No  Use of a cane, walker or w/c? Yes  Grab bars in the bathroom? No  Shower chair or bench in shower? No  Elevated toilet seat or a handicapped toilet? Yes   TIMED UP AND GO:  Was the test performed? No .  Length of time to ambulate 10 feet: not performed.   Gait slow and steady without use of assistive device  Cognitive Function:        01/30/2022    3:24 PM  6CIT Screen  What Year? 0 points  What month? 0 points  What time? 0 points  Count back from 20 0 points  Months in reverse 0 points    Immunizations Immunization History  Administered Date(s) Administered   Influenza Split 01/08/2012   Influenza Whole 03/03/2009, 12/27/2009, 12/19/2010   Influenza, High Dose Seasonal PF 01/12/2019   Influenza,inj,Quad PF,6+ Mos 12/29/2012, 12/31/2013, 03/07/2015, 01/23/2016, 01/06/2017, 01/11/2020, 02/07/2021, 01/28/2022   PFIZER(Purple Top)SARS-COV-2 Vaccination 03/29/2020, 04/29/2020   PNEUMOCOCCAL CONJUGATE-20 02/20/2021   PPD Test 09/25/2011, 07/17/2012, 06/25/2013, 05/21/2016, 11/11/2017   Pneumococcal Polysaccharide-23 03/17/2009, 07/06/2016, 10/20/2016   Tdap 12/19/2010    Screening Tests Health Maintenance  Topic Date Due   Diabetic kidney evaluation - Urine ACR  Never done   COLONOSCOPY (Pts 45-90yr Insurance coverage will need to be confirmed)  Never done   OPHTHALMOLOGY EXAM  07/08/2019   COVID-19 Vaccine (3 - Pfizer risk series) 05/27/2020   TETANUS/TDAP  12/18/2020   HEMOGLOBIN A1C  05/31/2022   FOOT EXAM  11/29/2022   Diabetic kidney evaluation - GFR measurement  01/11/2023   PAP SMEAR-Modifier  03/31/2023   INFLUENZA  VACCINE  Completed   Hepatitis C Screening  Completed   HIV Screening  Completed   HPV VACCINES  Aged Out    Health Maintenance  Health Maintenance Due  Topic Date Due   Diabetic kidney evaluation - Urine ACR  Never done   COLONOSCOPY (Pts 45-415yrInsurance coverage will need to be confirmed)  Never done   OPHTHALMOLOGY EXAM  07/08/2019   COVID-19 Vaccine (3 - Pfizer risk series) 05/27/2020   TETANUS/TDAP  12/18/2020    Colorectal cancer screening: Type of screening: Colonoscopy. Completed she is unsure of date. Repeat every 10 years we will need [facility]: Ordered today. Pt provided with contact info and advised to call to schedule appt.    Additional Screening:  Hepatitis C Screening: does qualify; Completed 08/23/2015    Dental Screening: Recommended annual dental exams for proper oral hygiene  Community Resource Referral / Chronic Care Management: CRR required this visit?  Yes   CCM required this visit?  Yes     Plan:   1. Encounter for Medicare annual wellness exam Counseled on 150 minutes of exercise per week, healthy eating (including decreased daily intake of saturated fats, cholesterol, added sugars, sodium),  routine healthcare maintenance.   2. Primary osteoarthritis of right knee - Misc. Devices MISC; Shower chair Grab bar for the shower Diagnosis:  bilateral knee osteoarthritis  Dispense: 1 each; Refill: 0  3. Abdominal gas pain We will need to exclude H. pylori She has been off omeprazole for over 2 weeks - H. pylori breath test  4. Encounter for screening mammogram for malignant neoplasm of breast - MM 3D SCREEN BREAST BILATERAL; Future  Case manager called to assist her with housing resources and also to discuss her oxygen requirement.  Information obtained that her oxygen was being provided by the company other than [employer] called [employer] . She does have a balance on her account but they said they  would supply her some oxygen.  I have personally reviewed and noted the following in the patient's chart:   Medical and social history Use of alcohol, tobacco or illicit drugs  Current medications and supplements including opioid prescriptions. Patient is currently taking opioid prescriptions. Information provided to patient regarding non-opioid alternatives. Patient advised to discuss non-opioid treatment plan with their provider. Functional ability and status Nutritional status Physical activity Advanced directives List of other physicians Hospitalizations, surgeries, and ER visits in previous 12 months Vitals Screenings to include cognitive, depression, and falls Referrals and appointments  In addition, I have reviewed and discussed  with patient certain preventive protocols, quality metrics, and best practice recommendations. A written personalized care plan for preventive services as well as general preventive health recommendations were provided to patient.     [name], MD   01/30/2022

## 2022-01-30 NOTE — Patient Instructions (Signed)
  April [name] , Thank you for taking time to come for your Medicare Wellness Visit. I appreciate your ongoing commitment to your health goals. Please review the following plan we discussed and let me know if I can assist you in the future.   These are the goals we discussed:  Goals      Blood Pressure < 140/90        This is a list of the screening recommended for you and due dates:  Health Maintenance  Topic Date Due   Yearly kidney health urinalysis for diabetes  Never done   Colon Cancer Screening  Never done   Eye exam for diabetics  07/08/2019   COVID-19 Vaccine (3 - Pfizer risk series) 05/27/2020   Tetanus Vaccine  12/18/2020   Hemoglobin A1C  05/31/2022   Complete foot exam   11/29/2022   Yearly kidney function blood test for diabetes  01/11/2023   Pap Smear  03/31/2023   Flu Shot  Completed   Hepatitis C Screening: USPSTF Recommendation to screen - Ages 18-79 yo.  Completed   HIV Screening  Completed   HPV Vaccine  Aged Out

## 2022-01-30 NOTE — Assessment & Plan Note (Signed)
Recent exacerbation required hospitalization.  She insists she is compliant with her Tezspire. Plan-flu vaccine today.  Refill DuoNeb with discussion.  Prednisone to take 10 mg daily when needed to try to head off exacerbations.  Steroid talk again done.

## 2022-01-30 NOTE — Telephone Encounter (Signed)
I met with the patient when she was in the clinic today.  She explained that she never received O2 tanks.  She only has a room concentrator that she thinks is not functioning properly. She could not remember where her O2 is from and she said Dr Margarita Rana can send an order to [facility] for O2.  I explained to her that we need to check to see who is currently providing her O2.  I was able to determine that [employer] is her O2 provider.  I called [employer] and spoke to [place]. She confirmed that they provide patient's O2 and she said they provided her with a room concentrator and 2 tanks.  She also said that the patient has a balance of $154.17.  I informed her that the patient said she does not have any tanks. Sophronia Simas said that their records show she has 2 tanks.  She said that because the patient has a balance, they will not deliver O2 tanks, the patient will need to come to their office and pick them up. They will not provide any additional tanks. Regarding service for the concentrator, they will send a technician out to the patient's home even though she has an account balance.   I informed the patient of my conversation with [employer]. She still said that they never provided her with O2 tanks.  I gave her the phone number for [employer] [phone] and instructed her to call the to request service for her room concentrator and discuss the issue with the O2 tanks.  She said she will call them . I reminded her that she will need to pick up any tanks at their office, those they will not deliver.   The patient then stated that she is currently living with friends and needs a place of her own. The housemates smoke and she has O2.  I asked if she had any additional information about [facility] and [facility] program and she said she did not. She was in agreement to me making a referral to the [facility] referral submitted  - referral ID [id number]

## 2022-01-31 LAB — H. PYLORI BREATH TEST: H pylori Breath Test: POSITIVE — AB

## 2022-02-01 ENCOUNTER — Telehealth: Payer: Self-pay

## 2022-02-01 ENCOUNTER — Other Ambulatory Visit: Payer: Self-pay | Admitting: Family Medicine

## 2022-02-01 DIAGNOSIS — B9681 Helicobacter pylori [H. pylori] as the cause of diseases classified elsewhere: Secondary | ICD-10-CM

## 2022-02-01 MED ORDER — CLARITHROMYCIN 500 MG PO TABS: 500.0000 mg | ORAL_TABLET | Freq: Two times a day (BID) | ORAL | 0 refills | Status: DC

## 2022-02-01 MED ORDER — AMOXICILLIN 500 MG PO CAPS: 1000.0000 mg | ORAL_CAPSULE | Freq: Two times a day (BID) | ORAL | 0 refills | Status: DC

## 2022-02-01 MED ORDER — OMEPRAZOLE 20 MG PO CPDR: 20.0000 mg | DELAYED_RELEASE_CAPSULE | Freq: Two times a day (BID) | ORAL | 0 refills | Status: DC

## 2022-02-01 NOTE — Telephone Encounter (Signed)
Requested Prescriptions  Pending Prescriptions Disp Refills  . potassium chloride SA (KLOR-CON M) 20 MEQ tablet [Pharmacy Med Name: POTASSIUM CL 20MEQ ER TABLETS] 90 tablet 3    Sig: TAKE 1 TABLET(20 MEQ) BY MOUTH DAILY     Endocrinology:  Minerals - Potassium Supplementation Passed - 02/01/2022 12:13 PM      Passed - K in normal range and within 360 days    Potassium  Date Value Ref Range Status  01/10/2022 4.1 3.5 - 5.1 mmol/L Final         Passed - Cr in normal range and within 360 days    Creat  Date Value Ref Range Status  05/21/2016 0.56 0.50 - 1.10 mg/dL Final   Creatinine, Ser  Date Value Ref Range Status  01/10/2022 0.96 0.44 - 1.00 mg/dL Final         Passed - Valid encounter within last 12 months    Recent Outpatient Visits          2 days ago Encounter for [employer] annual wellness exam   [place], [place], MD   2 months ago Type 2 diabetes mellitus without complication, without long-term current use of insulin ([place])   [place], Charlane Ferretti, MD   8 months ago Screening for colon cancer   [place], [place], MD   11 months ago Moderate persistent asthma with acute exacerbation   [facility] [place], Charlane Ferretti, MD   1 year ago Encounter for [employer] annual wellness exam   [place], MD      Future Appointments            In 1 month [name], MD [place]

## 2022-02-01 NOTE — Telephone Encounter (Signed)
Patient wants to know what caused her H.pylori breath test to be positive, she want to know if there is any foods that she may need to avoid.

## 2022-02-04 ENCOUNTER — Ambulatory Visit (HOSPITAL_COMMUNITY): Payer: Self-pay | Admitting: Psychiatry

## 2022-02-04 NOTE — Telephone Encounter (Signed)
Patient aware of response per Dr. Margarita Rana.

## 2022-02-04 NOTE — Telephone Encounter (Signed)
It can occur spontaneously.  There are no foods that she needs to avoid except if she has reflux then she might want to avoid foods that trigger reflux, avoid eating late or lying down right after eating.

## 2022-02-05 ENCOUNTER — Ambulatory Visit (HOSPITAL_BASED_OUTPATIENT_CLINIC_OR_DEPARTMENT_OTHER): Payer: Medicare Other | Admitting: Psychiatry

## 2022-02-05 VITALS — BP 136/81 | HR 83 | Wt 329.0 lb

## 2022-02-05 DIAGNOSIS — F331 Major depressive disorder, recurrent, moderate: Secondary | ICD-10-CM | POA: Diagnosis not present

## 2022-02-05 DIAGNOSIS — F4310 Post-traumatic stress disorder, unspecified: Secondary | ICD-10-CM

## 2022-02-05 MED ORDER — FLUOXETINE HCL 10 MG PO CAPS: 20.0000 mg | ORAL_CAPSULE | Freq: Every day | ORAL | 1 refills | Status: DC

## 2022-02-05 MED ORDER — DULOXETINE HCL 30 MG PO CPEP: 30.0000 mg | ORAL_CAPSULE | Freq: Every day | ORAL | 0 refills | Status: DC

## 2022-02-05 NOTE — Patient Instructions (Signed)
Follow up in 4 weeks Continue therapy
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
## 2022-02-06 ENCOUNTER — Telehealth: Payer: Self-pay | Admitting: Emergency Medicine

## 2022-02-06 NOTE — Telephone Encounter (Signed)
Call returned to Christy/ TCL program # [phone]  message left with call back requested.

## 2022-02-06 NOTE — Telephone Encounter (Signed)
I received a call back from the patient  She said she met with Kristy/ TCL program and Kristy is requesting an FL2.  I explained to the patient that I just left a message for Kristy and need to confirm what documentation she needs.  The patient said that the FL2 can be faxed to Kristy : [phone] or emailed to her: [email or web address].   The patient also said she saw the psychiatrist yesterday and was diagnosed with PTSD and will be starting on a new medication that she plans to pick up today. This afternoon she was at the dentist and had a tooth pulled in preparation for new dentures 

## 2022-02-06 NOTE — Telephone Encounter (Signed)
Call returned to patient [phone] , message left with call back requested

## 2022-02-06 NOTE — Telephone Encounter (Signed)
Copied from [place] [phone] [id number]. Topic: General - Other >> Feb 05, 2022  4:59 PM Cyndi Bender wrote: Reason for CRM: Pt requests that Opal Sidles return her call asap. Cb# [phone]

## 2022-02-06 NOTE — Telephone Encounter (Signed)
Copied from [place] [id number]. Topic: General - Other >> Feb 06, 2022  9:20 AM [place] B wrote: Reason for CRM: Alyse Low from [place] called in abot needing more info form [place] about info she needs for TCL for patient. Please call back

## 2022-02-10 IMAGING — DX DG CHEST 1V PORT
1 series · 1 of 1 positions shown · non-contrast
Comparison: 08/12/2020

CLINICAL DATA: Asthma exacerbation.  Shortness of breath.

EXAM:
PORTABLE CHEST 1 VIEW

[chest ap]
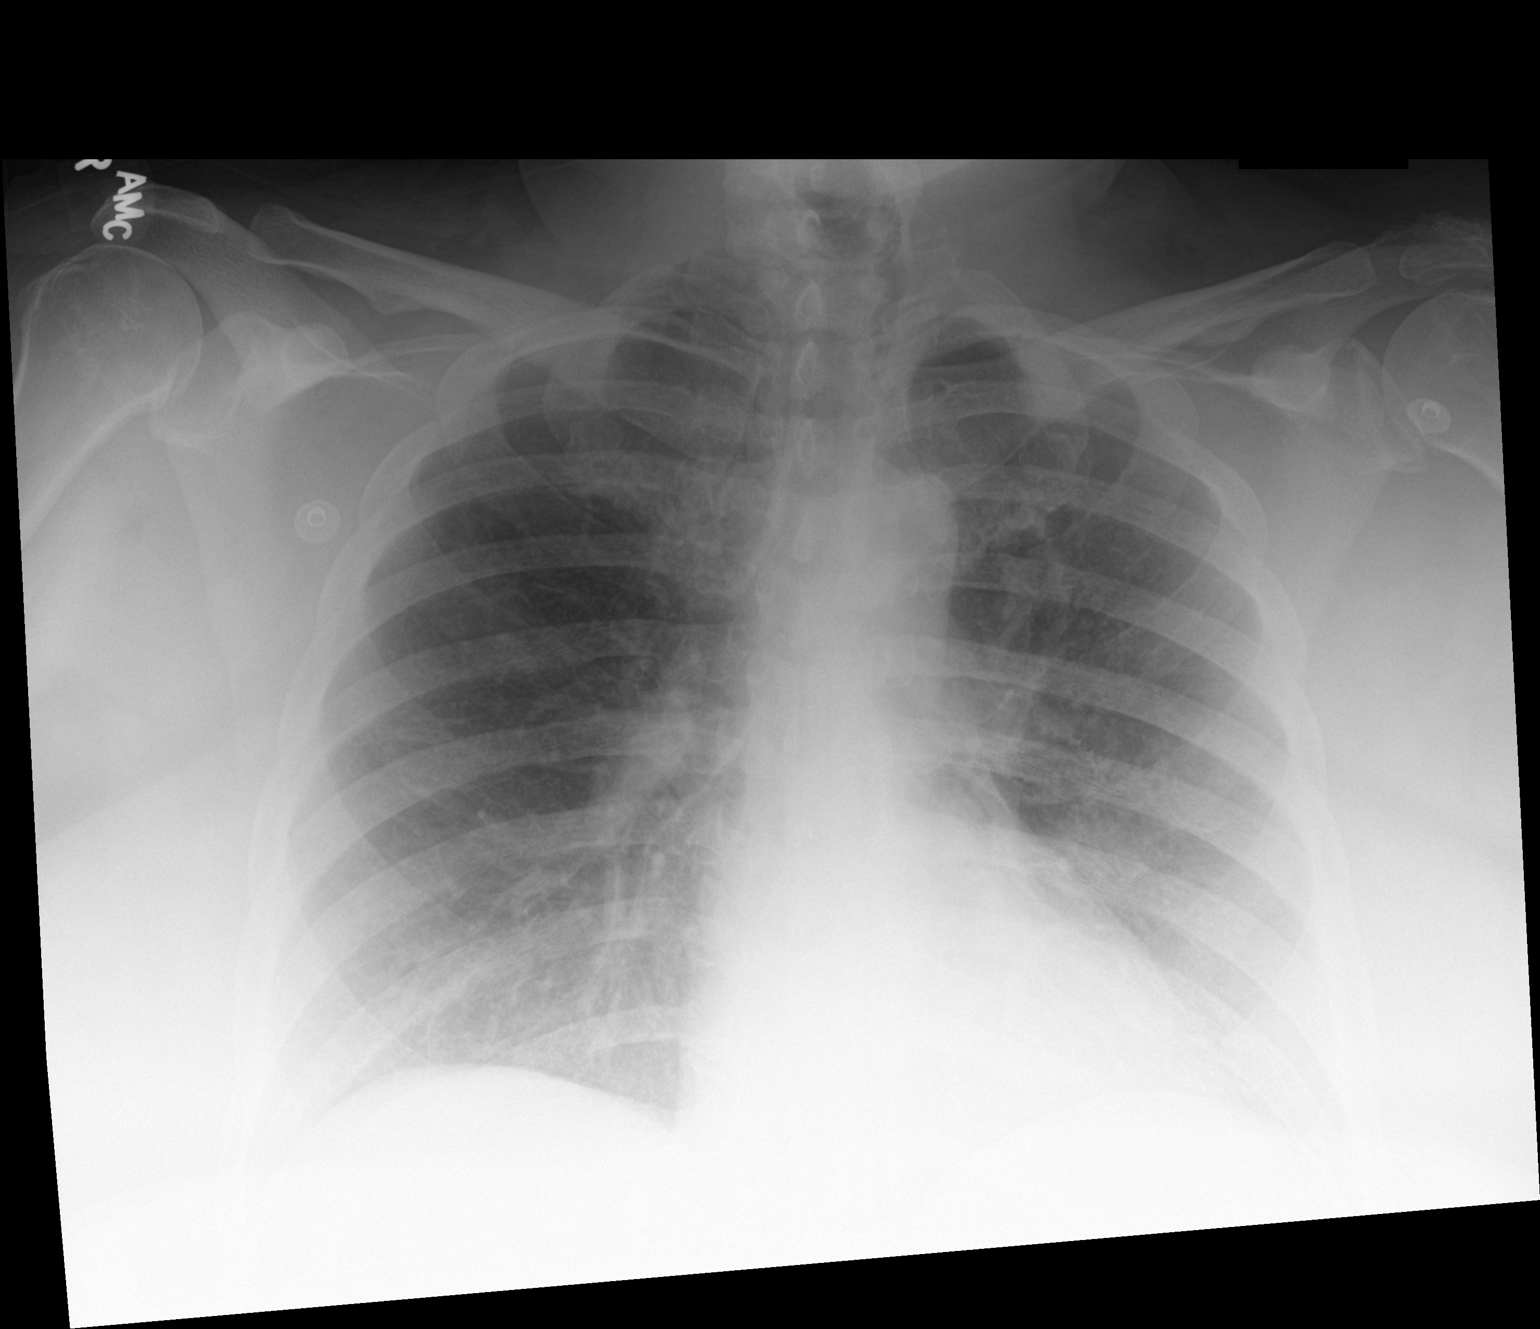

[1 of 1 positions shown; findings below may reference images not displayed]

FINDINGS: The cardiac silhouette, mediastinal and hilar contours are within
normal limits and stable. Chronic emphysematous and bronchitic type
lung changes but no definite infiltrates or effusions.
IMPRESSION: Chronic lung changes but no acute pulmonary findings.

## 2022-02-11 ENCOUNTER — Other Ambulatory Visit (HOSPITAL_COMMUNITY): Payer: Self-pay

## 2022-02-12 NOTE — Telephone Encounter (Signed)
Message left for Khristy/ TCL program # [phone],  call back requested.

## 2022-02-14 NOTE — Telephone Encounter (Signed)
Email sent to Burnadette Pop, MSw/[place] requesting clarification of what is needed for the TCL referral

## 2022-02-18 ENCOUNTER — Other Ambulatory Visit (HOSPITAL_COMMUNITY): Payer: Self-pay

## 2022-02-18 NOTE — Telephone Encounter (Signed)
I spoke to [employer] regarding documentation needed for TLC program. She explained that any documentation to support the patient's medical and behavioral needs is accepted.  An FL2 can be completed with the medication list attached. The med list does not have to be written on the FL2.    She said that the patient has called her multiple times regarding housing she explained to the patient that this is not a " housing " program.  Marita [place] also needs a CCA and she has discussed this with the patient. Marita [place] stated that she is not able to obtain it and the patient will need to get it.  The patient is in day 17 of the review process for the TCL program which allows 30 days to obtain the required documentation. If the information is not received in 30 days, a new referral will need to be placed.  Marita Kansas stated that all documentation can be emailed directly to her.  Marita Kansas said she has provided the patient with comprehensive community resource listing that includes housing options.   I spoke to the patient this afternoon and explained my conversation with [place].  The patient said she is aware that TCL is not a housing program and if she says she is just looking for housing, they will reject the referral.  The patient said she understands the basis for the program is to provide behavioral health support in the community and that can include housing.  I explained to her about the need to obtain the CCA from her behavioral health provider.  She stated that she will go to [facility] tomorrow and sign a release of information to obtain CCA.  She is also working on obtaining her income statement. I told her that if they do not have a CCA, she will need to have one completed. She said she understood and would bring all of the documents to me to email to [place].

## 2022-02-18 NOTE — Telephone Encounter (Signed)
PT CALLED BACK TODAY ASKING [place] HER  #  [phone]

## 2022-02-19 IMAGING — DX DG CHEST 1V
1 series · 1 of 1 positions shown · non-contrast
Comparison: 01/25/2021

CLINICAL DATA: Dyspnea, COVID pneumonia

EXAM:
CHEST  1 VIEW

[chest]
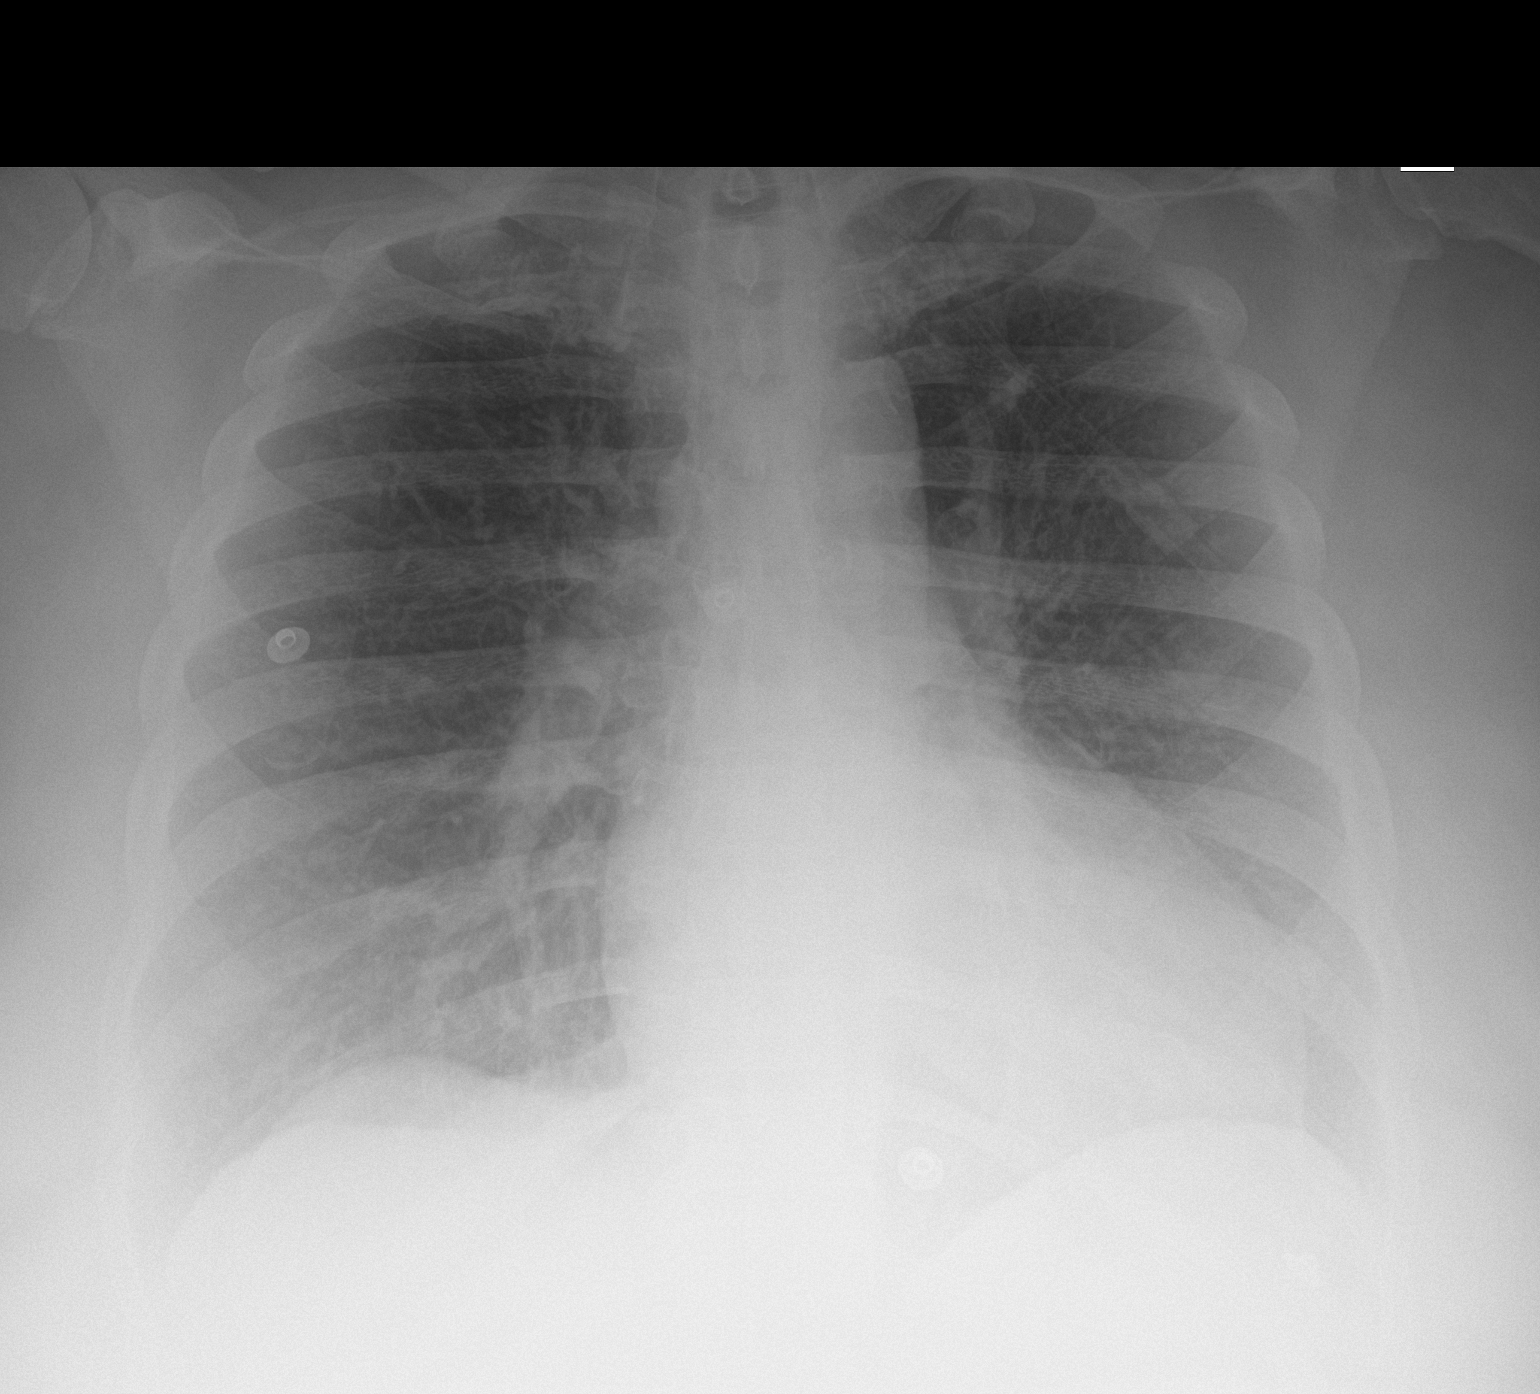

[1 of 1 positions shown; findings below may reference images not displayed]

FINDINGS: The heart size and mediastinal contours are within normal limits.
Both lungs are clear. The visualized skeletal structures are
unremarkable.
IMPRESSION: No active disease.

## 2022-02-21 IMAGING — DX DG CHEST 1V PORT
1 series · 1 of 1 positions shown · non-contrast
Comparison: Chest radiograph, 02/03/2021.  CT chest, 10/08/2017.

CLINICAL DATA: Shortness of breath.  COVID positive.

EXAM:
PORTABLE CHEST 1 VIEW

[chest ap]
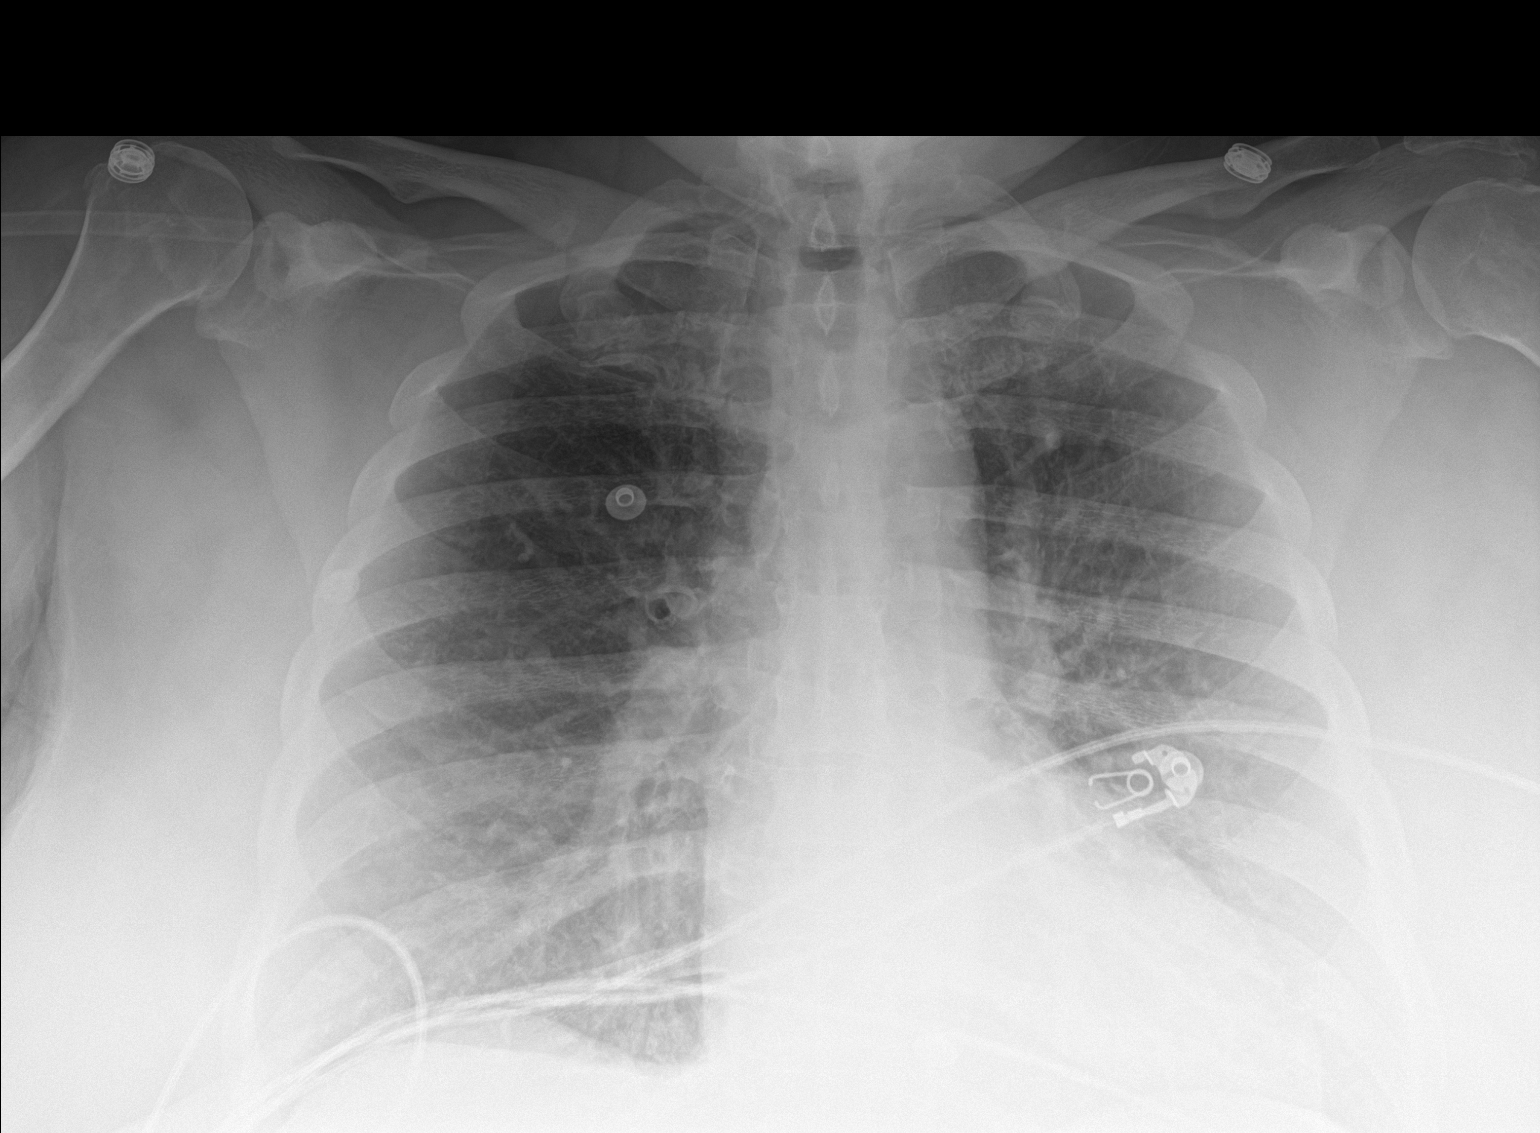

[1 of 1 positions shown; findings below may reference images not displayed]

FINDINGS: Cardiac silhouette is within normal limits. The lungs are
hypoinflated. Relatively clear RIGHT chest, with minimal streaky
middle lobe opacities. LEFT basilar hazy opacity. No discrete
consolidation. No pleural effusion or pneumothorax. No acute osseous
abnormality.
IMPRESSION: Hypoinflation with minimal basilar atelectasis.

## 2022-02-22 ENCOUNTER — Telehealth: Payer: Self-pay | Admitting: Family Medicine

## 2022-02-22 NOTE — Telephone Encounter (Signed)
Patient was called and asked if April [name] will return her call

## 2022-02-22 NOTE — Telephone Encounter (Signed)
Pt is calling to ask if Opal Sidles can call her back please advise CB- [phone]

## 2022-02-22 NOTE — Telephone Encounter (Signed)
Routing to [employer] and the nurse covering for her.

## 2022-02-27 ENCOUNTER — Telehealth: Payer: Self-pay | Admitting: Emergency Medicine

## 2022-02-27 NOTE — Telephone Encounter (Signed)
Copied from [place] [id number]. Topic: General - Other >> Feb 27, 2022  3:45 PM April [name] wrote: Reason for CRM: The patient has called to follow up on their previously discussed paperwork related to a release of information for the patient   The patient shares that the paperwork is due by 02/28/22  Please contact further when possible

## 2022-02-28 ENCOUNTER — Ambulatory Visit (INDEPENDENT_AMBULATORY_CARE_PROVIDER_SITE_OTHER): Payer: Medicare Other | Admitting: Licensed Clinical Social Worker

## 2022-02-28 DIAGNOSIS — F331 Major depressive disorder, recurrent, moderate: Secondary | ICD-10-CM

## 2022-02-28 DIAGNOSIS — F4310 Post-traumatic stress disorder, unspecified: Secondary | ICD-10-CM | POA: Diagnosis not present

## 2022-02-28 NOTE — Plan of Care (Signed)
  Problem: Depression Goal:  Decrease depressive symptoms and improve levels of effective functioning-pt reports a decrease in overall depression symptoms 3 out of 5 sessions documented.  Outcome: Not Progressing Goal: Develop healthy thinking patterns and beliefs about self, others, and the world that lead to the alleviation and help prevent the relapse of depression per self report 3 out of 5 sessions documented.   Outcome: Not Progressing Intervention: Encourage verbalization of feelings/concerns/expectations Note: Allowed pt to explore and express Intervention: REVIEW PLEASE SKILLS (TREAT PHYSICAL ILLNESS, BALANCE EATING, AVOID MOOD-ALTERING SUBSTANCES, BALANCE SLEEP AND GET EXERCISE) WITH [place] Note: Reviewed   Problem: Anxiety  Goal: Reduce overall frequency, intensity, and duration of the anxiety so that daily functioning is not impaired per pt self report 3 out of 5 sessions documented.   Outcome: Progressing Goal: Learn and implement coping skills that result in a reduction of anxiety and worry, and improve daily functioning per pt report 3 out of 5 sessions documented  Outcome: Progressing

## 2022-02-28 NOTE — Progress Notes (Signed)
THERAPIST PROGRESS NOTE  Session Time: 3-4p  [place] in office visit for patient and LCSW clinician  Participation Level: Active  Behavioral Response: CasualAlertAnxious and Depressed  Type of Therapy: Individual Therapy  Treatment Goals addressed:    Problem: Depression Goal:  Decrease depressive symptoms and improve levels of effective functioning-pt reports a decrease in overall depression symptoms 3 out of 5 sessions documented.  Outcome: Not Progressing Goal: Develop healthy thinking patterns and beliefs about self, others, and the world that lead to the alleviation and help prevent the relapse of depression per self report 3 out of 5 sessions documented.   Outcome: Not Progressing Intervention: Encourage verbalization of feelings/concerns/expectations Note: Allowed pt to explore and express Intervention: REVIEW PLEASE SKILLS (TREAT PHYSICAL ILLNESS, BALANCE EATING, AVOID MOOD-ALTERING SUBSTANCES, BALANCE SLEEP AND GET EXERCISE) WITH Marci Note: Reviewed   Problem: Anxiety  Goal: Reduce overall frequency, intensity, and duration of the anxiety so that daily functioning is not impaired per pt self report 3 out of 5 sessions documented.   Outcome: Progressing Goal: Learn and implement coping skills that result in a reduction of anxiety and worry, and improve daily functioning per pt report 3 out of 5 sessions documented  Outcome: Progressing    ProgressTowards Goals: Progressing  Interventions: Solution Focused and Supportive  Summary: April [name] is a 51 y.o. female who presents with continuing symptoms related to depression and trauma/anxiety. Pt reports that "I'm the same".  Pt reports that there were some medication changes at her most recent psychiatric visit, but "I didn't see any difference so I stopped taking them". Encouraged pt to restart medications and take as prescribed by psychiatrist. Informed pt that medication changes may take  5-6 weeks before feeling a difference. Pt reflects understanding.  Pt states that she wants LCSW clinician to "make me a paper with my progress and diagnosis on it". Clinician questioned exactly what pt needed documentation for and exactly what she needs. Pt states that she asked psychiatrist and they refused. Reminded pt that she has not been in psychiatric care very long and that there is not a lot of data to support any/if any progress at this point in treatment. Pt states that she needs the "paperwork" for housing benefits. Informed pt that if she needs paperwork then she needs to produce a letter from the entity that needs the information stating exact data that is needed, then pt would need to sign a ROI. Informed pt that clinician would need to read the requirements and then determine whether clinician has enough data needed. Pt gave conflicting information about her current housing situation and how much she is paying per month.  Reviewed coping skills for managing stress/anxiety/depression. Pt reports that her primary stressor continues to be her overall health and physical limitations.    Suicidal/Homicidal: No  Therapist Response: Pt is continuing to apply interventions learned in session into daily life situations. Pt is currently on track to meet goals utilizing interventions mentioned above. Personal growth and progress noted. Treatment to continue as indicated.   Plan: Return again in 4 weeks.  Diagnosis:  Encounter Diagnoses  Name Primary?   Moderate episode of recurrent major depressive disorder (HCC) Yes   PTSD (post-traumatic stress disorder)    Collaboration of Care: Other pt encouraged to continue care with psychiatric provider of record, Dr. Armando Reichert.   Patient/Guardian was advised Release of Information must be obtained prior to any record release in order to collaborate their care with  an outside provider. Patient/Guardian was advised if they have not already done so  to contact the registration department to sign all necessary forms in order for [place] to release information regarding their care.   Consent: Patient/Guardian gives verbal consent for treatment and assignment of benefits for services provided during this visit. Patient/Guardian expressed understanding and agreed to proceed.   [place], LCSW 02/28/2022

## 2022-02-28 NOTE — Telephone Encounter (Signed)
I just spoke to the patient and the conversation is documented in another encounter from today

## 2022-02-28 NOTE — Telephone Encounter (Signed)
Message received that patient is requesting a call back.  I spoke to her and she wanted to know if I received medical records/ CCA from Behavioral health. She said she was there last week and requested the information be sent to myself or Dr Margarita Rana. The information is needed for her TCL referral.   I told her that I have not received it. I did receive the copy of her income statement.  Dr Margarita Rana- did you receive anything from behavioral health regarding the patient?

## 2022-03-01 NOTE — Telephone Encounter (Signed)
Can you check and see if a records request was sent from [facility]

## 2022-03-01 NOTE — Telephone Encounter (Signed)
I have not come across it but I will be on the look out.

## 2022-03-04 NOTE — Telephone Encounter (Signed)
Pt called asking if Opal Sidles would call her back about the paperwork  CB@  [phone]

## 2022-03-04 NOTE — Telephone Encounter (Signed)
Message received from [facility] stating that the referral has timed out. The information requested has not been received in 30 days.  A new referral will need to be submitted.   Fax received from [facility] stating that they do not find any records from behavioral health for the request that the patient specified.

## 2022-03-05 NOTE — Telephone Encounter (Signed)
I spoke to the patient and informed her that the TCL referral has timed out;  but we can submit another referral when we have obtained all of the required documentation.  I told her that we received a fax from [facility] stating that they were not able to find the behavioral health recorders she requested.  I explained to her that it is very important that she obtain the CCA from her behavioral health provider.  She then noted that Kristy/ [employer] gave her a form for the behavioral health provider to complete but she cannot find it.  She requested that I ask Marita [place] for a copy of that form and I can give it to her(patient)  at her appointment with Dr Margarita Rana next week.  Explained to the patient that the CCA is a very important part of the application for TCL.  She needs to provide information about the behavioral health support that she needs in the community   We received an order for O2 from [employer] that needs provider's signature. I asked patient if she was still  using [employer] and she said yes, but she is not opposed to switching to another DME provider.  I told her that if she changes DME providers she will need another walk test.  She said she will stay with [employer] for now. She said that there are not any problems with her O2 concentrator now.  She stated that  she still owes [employer] money and understands that she will need to pay them. She added that she has not received the back up O2 tank(s). I reminded her that she needs to go to [employer] to pick up the tanks, they will not deliver. I offered to provide her with the phone number for [employer] but she said she will look it up and will contact them  to confirm the address to pick up  back up tank.     I called Kristy/ [employer] to request a copy of the form patient needs. Message left , call back requested. The form can be faxed or emailed to me.

## 2022-03-07 NOTE — Telephone Encounter (Signed)
I left another message for April [name]/[facility] requesting the form that the patient lost be emailed to me for patient's appt at [facility] on 03/11/2022.

## 2022-03-11 ENCOUNTER — Encounter: Payer: Self-pay | Admitting: Family Medicine

## 2022-03-11 ENCOUNTER — Ambulatory Visit: Payer: Medicare Other | Attending: Family Medicine | Admitting: Family Medicine

## 2022-03-11 ENCOUNTER — Other Ambulatory Visit: Payer: Self-pay | Admitting: Family Medicine

## 2022-03-11 VITALS — BP 114/76 | HR 83 | Temp 98.0°F | Ht 66.0 in | Wt 312.4 lb

## 2022-03-11 DIAGNOSIS — E1159 Type 2 diabetes mellitus with other circulatory complications: Secondary | ICD-10-CM

## 2022-03-11 DIAGNOSIS — J4541 Moderate persistent asthma with (acute) exacerbation: Secondary | ICD-10-CM

## 2022-03-11 DIAGNOSIS — K2970 Gastritis, unspecified, without bleeding: Secondary | ICD-10-CM | POA: Diagnosis not present

## 2022-03-11 DIAGNOSIS — M5432 Sciatica, left side: Secondary | ICD-10-CM

## 2022-03-11 DIAGNOSIS — E119 Type 2 diabetes mellitus without complications: Secondary | ICD-10-CM | POA: Diagnosis not present

## 2022-03-11 DIAGNOSIS — I152 Hypertension secondary to endocrine disorders: Secondary | ICD-10-CM

## 2022-03-11 DIAGNOSIS — I480 Paroxysmal atrial fibrillation: Secondary | ICD-10-CM

## 2022-03-11 DIAGNOSIS — B9681 Helicobacter pylori [H. pylori] as the cause of diseases classified elsewhere: Secondary | ICD-10-CM

## 2022-03-11 DIAGNOSIS — F331 Major depressive disorder, recurrent, moderate: Secondary | ICD-10-CM

## 2022-03-11 LAB — POCT GLYCOSYLATED HEMOGLOBIN (HGB A1C): HbA1c, POC (controlled diabetic range): 5.5 % (ref 0.0–7.0)

## 2022-03-11 LAB — GLUCOSE, POCT (MANUAL RESULT ENTRY): POC Glucose: 75 mg/dl (ref 70–99)

## 2022-03-11 MED ORDER — CLONIDINE HCL 0.2 MG PO TABS: 0.2000 mg | ORAL_TABLET | Freq: Two times a day (BID) | ORAL | 3 refills | Status: DC

## 2022-03-11 MED ORDER — AMLODIPINE BESYLATE 10 MG PO TABS: 10.0000 mg | ORAL_TABLET | Freq: Every day | ORAL | 1 refills | Status: DC

## 2022-03-11 NOTE — Patient Instructions (Signed)
Sciatica Rehab Ask your health care provider which exercises are safe for you. Do exercises exactly as told by your health care provider and adjust them as directed. It is normal to feel mild stretching, pulling, tightness, or discomfort as you do these exercises. Stop right away if you feel sudden pain or your pain gets worse. Do not begin these exercises until told by your health care provider. Stretching and range-of-motion exercises These exercises warm up your muscles and joints and improve the movement and flexibility of your hips and back. These exercises also help to relieve pain, numbness, and tingling. Sciatic nerve glide  Sit in a chair with your head facing down toward your chest. Place your hands behind your back. Let your shoulders slump forward. Slowly straighten one of your legs while you tilt your head back as if you are looking toward the ceiling. Only straighten your leg as far as you can without making your symptoms worse. Hold this position for __________ seconds. Slowly return your leg and head back to the starting position. Repeat with your other leg. Repeat __________ times. Complete this exercise __________ times a day. Knee to chest with hip adduction and internal rotation  Lie on your back on a firm surface with both legs straight. Bend one of your knees and move it up toward your chest until you feel a gentle stretch in your lower back and buttock. Then, move your knee toward the shoulder that is on the opposite side from your leg. This is hip adduction and internal rotation. Hold your leg in this position by holding on to the front of your knee. Hold this position for __________ seconds. Slowly return to the starting position. Repeat with your other leg. Repeat __________ times. Complete this exercise __________ times a day. Prone extension on elbows  Lie on your abdomen on a firm surface. A bed may be too soft for this exercise. Prop yourself up on your  elbows. Use your arms to help lift your chest up until you feel a gentle stretch in your abdomen and your lower back. This will place some of your body weight on your elbows. If this is uncomfortable, try stacking pillows under your chest. Your hips should stay down, against the surface that you are lying on. Keep your hip and back muscles relaxed. Hold this position for __________ seconds. Slowly relax your upper body and return to the starting position. Repeat __________ times. Complete this exercise __________ times a day. Strengthening exercises These exercises build strength and endurance in your back. Endurance is the ability to use your muscles for a long time, even after they get tired. Pelvic tilt This exercise strengthens the muscles that lie deep in the abdomen. Lie on your back on a firm surface. Bend your knees and keep your feet flat on the surface. Tense your abdominal muscles. Tip your pelvis up toward the ceiling and flatten your lower back into the firm surface. To help with this exercise, you may place a small towel under your lower back and try to push your back into the towel. Hold this position for __________ seconds. Let your muscles relax completely before you repeat this exercise. Repeat __________ times. Complete this exercise __________ times a day. Alternating arm and leg raises  Get on your hands and knees on a firm surface. If you are on a hard floor, you may want to use padding, such as an exercise mat, to cushion your knees. Line up your arms and legs. Your  hands should be directly below your shoulders, and your knees should be directly below your hips. Lift your left leg behind you. At the same time, raise your right arm and straighten it in front of you. Do not lift your leg higher than your hip. Do not lift your arm higher than your shoulder. Keep your abdominal and back muscles tight. Keep your hips facing the ground. Do not arch your back. Keep your  balance carefully, and do not hold your breath. Hold this position for __________ seconds. Slowly return to the starting position. Repeat with your right leg and your left arm. Repeat __________ times. Complete this exercise __________ times a day. Posture and body mechanics Good posture and healthy body mechanics can help to relieve stress in your body's tissues and joints. Body mechanics refers to the movements and positions of your body while you do your daily activities. Posture is part of body mechanics. Good posture means: Your spine is in its natural S-curve position (neutral). Your shoulders are pulled back slightly. Your head is not tipped forward. Follow these guidelines to improve your posture and body mechanics in your everyday activities. Standing  When standing, keep your spine neutral and your feet about hip width apart. Keep a slight bend in your knees. Your ears, shoulders, and hips should line up. When you do a task in which you stand in one place for a long time, place one foot up on a stable object that is 2-4 inches (5-10 cm) high, such as a footstool. This helps keep your spine neutral. Sitting  When sitting, keep your spine neutral and keep your feet flat on the floor. Use a footrest, if necessary, and keep your thighs parallel to the floor. Avoid rounding your shoulders, and avoid tilting your head forward. When working at a desk or a computer, keep your desk at a height where your hands are slightly lower than your elbows. Slide your chair under your desk so you are close enough to maintain good posture. When working at a computer, place your monitor at a height where you are looking straight ahead and you do not have to tilt your head forward or downward to look at the screen. Resting  When lying down and resting, avoid positions that are most painful for you. If you have pain with activities such as sitting, bending, stooping, or squatting, lie in a position in which  your body does not bend very much. For example, avoid curling up on your side with your arms and knees near your chest (fetal position). If you have pain with activities such as standing for a long time or reaching with your arms, lie with your spine in a neutral position and bend your knees slightly. Try the following positions: Lying on your side with a pillow between your knees. Lying on your back with a pillow under your knees. Lifting  When lifting objects, keep your feet at least shoulder width apart and tighten your abdominal muscles. Bend your knees and hips and keep your spine neutral. It is important to lift using the strength of your legs, not your back. Do not lock your knees straight out. Always ask for help to lift heavy or awkward objects. This information is not intended to replace advice given to you by your health care provider. Make sure you discuss any questions you have with your health care provider. Document Revised: 07/24/2021 Document Reviewed: 07/24/2021 Else[employer] Patient Education  [employer].

## 2022-03-11 NOTE — Progress Notes (Signed)
Subjective:  Patient ID: April [name], female    DOB: [birth year]  Age: 51 y.o. MRN: [id number]  CC: Diabetes   HPI April [name] is a 51 y.o. year old female with a history of morbid obesity, major depressive disorder, hypertension, COPD/ Asthma , paroxysmal atrial fibrillation obstructive sleep apnea (not compliant with CPAP) with multiple hospitalizations for asthma exacerbation/COPD , steroid-induced diabetes  (previously on metformin)   Interval History:  She had a visit with behavioral health last month for management of her depression reports doing well.   She has lost 23 lbs as she has lost her appetite in addition to being on phentermine.  She is followed by [facility] medical weight management.  Complains of pain in her left thigh starting from her left buttock down her thigh and no pain medication helped. She had stopped her potassium pills because they were big to swallow but when she restarted them she felt a bit better.  She completed treatment for H.pylori and is due for repeat.  A1c is 5.5 and she is currently on diet control with regards to diabetes.  Denies presence of visual concerns, neuropathy or hypoglycemia. Her asthma is doing well with no recent flares and she states she has not been taking her prednisone as prescribed.  Currently being managed by pulmonary. Past Medical History:  Diagnosis Date   Acanthosis nigricans    Anxiety    Arthritis    "knees" (04/28/2017)   Asthma    Followed by Dr. Maple Hudson (pulmonology); receives every other week omalizumab injections; has frequent exacerbations   Back pain    Chronic diastolic CHF (congestive heart failure) (HCC) 01/17/2017   COPD (chronic obstructive pulmonary disease) (HCC)    PFTs in 2002, FEV1/FVC 65, no post bronchodilater test done   Depression    GERD (gastroesophageal reflux disease)    Headache(784.0)    "q couple days" (04/28/2017)   Helicobacter pylori (H. pylori) infection    Hypertension, essential     Insomnia    Joint pain    Lower extremity edema    Menorrhagia    Morbid obesity (HCC)    OSA on CPAP    Sleep study 2008 - mild OSA, not enough events to titrate CPAP; wears CPAP now/pt on 04/28/2017   Pneumonia X 1   Prediabetes    Rheumatoid arthritis (HCC)    Seasonal allergies    Shortness of breath    Tobacco user    Vitamin D deficiency     Past Surgical History:  Procedure Laterality Date   CARDIOVERSION N/A 05/30/2017   Procedure: CARDIOVERSION;  Surgeon: Thurmon Fair, MD;  Location: [facility] END[facility];  Service: Cardiovascular;  Laterality: N/A;   REDUCTION MAMMAPLASTY Bilateral 09/2011   TUBAL LIGATION  1996   bilateral    Family History  Problem Relation Age of Onset   Hypertension Mother    Asthma Daughter    Cancer Paternal Aunt    Asthma Maternal Grandmother     Social History   Socioeconomic History   Marital status: Single    Spouse name: Not on file   Number of children: 1   Years of education: Not on file   Highest education level: Not on file  Occupational History   Occupation: stay at home  Tobacco Use   Smoking status: Former    Packs/day: 0.50    Years: 26.00    Total pack years: 13.00    Types: Cigarettes    Quit date: 09/12/2014  Years since quitting: 7.4   Smokeless tobacco: Never   Tobacco comments:    Has vaped before, caught in the hospital  Vaping Use   Vaping Use: Never used  Substance and Sexual Activity   Alcohol use: No   Drug use: No   Sexual activity: Never  Other Topics Concern   Not on file  Social History Narrative   Daughters are grown, not married, works as a Lawyer.   Social Determinants of Health   Financial Resource Strain: High Risk (01/30/2022)   Overall Financial Resource Strain (CARDIA)    Difficulty of Paying Living Expenses: Very hard  Food Insecurity: No Food Insecurity (01/08/2022)   Hunger Vital Sign    Worried About Running Out of Food in the Last Year: Never true    Ran Out of Food in the Last Year:  Never true  Transportation Needs: No Transportation Needs (01/08/2022)   PRAPARE - Administrator, Civil Service (Medical): No    Lack of Transportation (Non-Medical): No  Physical Activity: Insufficiently Active (01/30/2022)   Exercise Vital Sign    Days of Exercise per Week: 2 days    Minutes of Exercise per Session: 30 min  Stress: Stress Concern Present (01/30/2022)   [employer] of Occupational Health - Occupational Stress Questionnaire    Feeling of Stress : Very much  Social Connections: Socially Isolated (01/30/2022)   Social Connection and Isolation Panel [NHANES]    Frequency of Communication with Friends and Family: More than three times a week    Frequency of Social Gatherings with Friends and Family: Three times a week    Attends Religious Services: Never    Active Member of Clubs or Organizations: No    Attends Banker Meetings: Never    Marital Status: Never married    Allergies  Allergen Reactions   Contrast Media [Iodinated Contrast Media] Itching    CT contrast    Outpatient Medications Prior to Visit  Medication Sig Dispense Refill   albuterol (PROAIR HFA) 108 (90 Base) MCG/ACT inhaler Inhale 2 puffs into the lungs every 6 (six) hours as needed for wheezing or shortness of breath. (Patient taking differently: Inhale 2 puffs into the lungs 3 (three) times daily as needed for wheezing or shortness of breath.) 8.5 g 11   amLODipine (NORVASC) 10 MG tablet TAKE 1 TABLET(10 MG) BY MOUTH DAILY (Patient taking differently: Take 10 mg by mouth daily.) 90 tablet 1   apixaban (ELIQUIS) 5 MG TABS tablet Take 1 tablet (5 mg total) by mouth 2 (two) times daily. 180 tablet 1   carvedilol (COREG) 6.25 MG tablet Take 6.25 mg by mouth 2 (two) times daily.     clonazePAM (KLONOPIN) 0.5 MG tablet Take 1 tablet (0.5 mg total) by mouth daily as needed for anxiety. (Patient taking differently: Take 0.5 mg by mouth 2 (two) times daily as needed for anxiety.) 10  tablet 0   cloNIDine (CATAPRES) 0.2 MG tablet Take 1 tablet (0.2 mg total) by mouth 2 (two) times daily. 60 tablet 3   COMBIVENT RESPIMAT 20-100 MCG/ACT AERS respimat Inhale 1 puff into the lungs as needed for wheezing or shortness of breath.     diclofenac Sodium (VOLTAREN) 1 % GEL Apply 1 Application topically as needed (pain).     DULoxetine (CYMBALTA) 30 MG capsule Take 1 capsule (30 mg total) by mouth daily. 7 capsule 0   EPINEPHrine 0.3 mg/0.3 mL IJ SOAJ injection Inject 0.3 mg into the muscle  as needed for anaphylaxis. 1 each 5   FLUoxetine (PROZAC) 10 MG capsule Take 2 capsules (20 mg total) by mouth daily. Take 1 capsule for first 7 days and then start taking 2 capsules daily. 60 capsule 1   fluticasone (FLONASE) 50 MCG/ACT nasal spray SHAKE LIQUID AND USE 1 SPRAY IN EACH NOSTRIL DAILY AS NEEDED FOR ALLERGIES OR RHINITIS (Patient taking differently: Place 1 spray into both nostrils daily.) 16 g 0   Fluticasone-Umeclidin-Vilant 100-62.5-25 MCG/ACT AEPB Inhale 1 puff into the lungs daily. 60 each 5   furosemide (LASIX) 20 MG tablet Take 1 tablet (20 mg total) by mouth daily. 30 tablet 3   ipratropium-albuterol (DUONEB) 0.5-2.5 (3) MG/3ML SOLN 1 neb every 6 hours if needed for asthma 75 mL 12   levothyroxine (SYNTHROID) 50 MCG tablet Take 1 tablet (50 mcg total) by mouth daily. 90 tablet 1   Misc. Devices MISC Bedside commode 1 each 0   Misc. Devices MISC Shower chair Grab bar for the shower Diagnosis:  bilateral knee osteoarthritis 1 each 0   naloxone (NARCAN) nasal spray 4 mg/0.1 mL Place 1 spray into the nose once as needed (opioid overdose).     omeprazole (PRILOSEC) 20 MG capsule Take 1 capsule (20 mg total) by mouth 2 (two) times daily before a meal. 28 capsule 0   oxyCODONE-acetaminophen (PERCOCET) 10-325 MG tablet Take 1 tablet by mouth 5 (five) times daily as needed (nerve pain).     phentermine 15 MG capsule Take 15 mg by mouth daily.     potassium chloride SA (KLOR-CON M) 20 MEQ  tablet TAKE 1 TABLET(20 MEQ) BY MOUTH DAILY 90 tablet 3   predniSONE (DELTASONE) 10 MG tablet 1 daily if needed 60 tablet 2   pregabalin (LYRICA) 300 MG capsule Take 300 mg by mouth in the morning, at noon, and at bedtime.     Semaglutide, 1 MG/DOSE, (OZEMPIC, 1 MG/DOSE,) 4 MG/3ML SOPN Inject 1 mg into the skin once a week.     Tezepelumab-ekko (TEZSPIRE) 210 MG/1. SOAJ Inject 210 mg into the skin every 28 (twenty-eight) days. 1.91 mL 5   topiramate (TOPAMAX) 200 MG tablet Take 200 mg by mouth at bedtime.     Vitamin D, Ergocalciferol, (DRISDOL) 1.25 MG (50000 UNIT) CAPS capsule Take 50,000 Units by mouth once a week.     zolpidem (AMBIEN) 10 MG tablet TAKE 1 TABLET BY MOUTH AT BEDTIME AS NEEDED FOR SLEEP 30 tablet 5   amoxicillin (AMOXIL) 500 MG capsule Take 2 capsules (1,000 mg total) by mouth 2 (two) times daily. 56 capsule 0   baclofen (LIORESAL) 20 MG tablet Take 20 mg by mouth daily as needed for muscle spasms (pain).     clarithromycin (BIAXIN) 500 MG tablet Take 1 tablet (500 mg total) by mouth 2 (two) times daily. 28 tablet 0   No facility-administered medications prior to visit.     ROS Review of Systems  Constitutional:  Positive for appetite change. Negative for activity change.  HENT:  Negative for sinus pressure and sore throat.   Respiratory:  Negative for chest tightness, shortness of breath and wheezing.   Cardiovascular:  Negative for chest pain and palpitations.  Gastrointestinal:  Negative for abdominal distention, abdominal pain and constipation.  Genitourinary: Negative.   Musculoskeletal:        See HPI  Psychiatric/Behavioral:  Negative for behavioral problems and dysphoric mood.     Objective:  BP 114/76   Pulse 83   Temp 98 F (  36.7 C) (Oral)   Ht 5\' 6"  (1.676 m)   Wt (!) 312 lb 6.4 oz (141.7 kg)   SpO2 94%   BMI 50.42 kg/m      03/11/2022    2:25 PM 02/05/2022    2:08 PM 01/30/2022    3:20 PM  BP/Weight  Systolic BP 114  034  Diastolic BP  76  88  Wt. (Lbs) 312.4  335  BMI 50.42 kg/m2  54.07 kg/m2     Information is confidential and restricted. Go to Review Flowsheets to unlock data.    Wt Readings from Last 3 Encounters:  03/11/22 (!) 312 lb 6.4 oz (141.7 kg)  01/30/22 (!) 335 lb (152 kg)  01/28/22 (!) 333 lb 9.6 oz (151.3 kg)      Physical Exam Constitutional:      Appearance: She is well-developed. She is obese.  Cardiovascular:     Rate and Rhythm: Normal rate.     Heart sounds: Normal heart sounds. No murmur heard. Pulmonary:     Effort: Pulmonary effort is normal.     Breath sounds: Wheezing (slight b/l wheeze in all lung fields) present. No rales.  Chest:     Chest wall: No tenderness.  Abdominal:     General: Bowel sounds are normal. There is no distension.     Palpations: Abdomen is soft. There is no mass.     Tenderness: There is no abdominal tenderness.  Musculoskeletal:        General: Normal range of motion.     Right lower leg: No edema.     Left lower leg: No edema.     Comments: Tenderness on deep palpation of left buttock and left thigh  Neurological:     Mental Status: She is alert and oriented to person, place, and time.  Psychiatric:        Mood and Affect: Mood normal.        Latest Ref Rng & Units 01/10/2022    5:44 AM 01/09/2022    5:27 AM 01/07/2022   11:42 PM  CMP  Glucose 70 - 99 mg/dL 742  595  75   BUN 6 - 20 mg/dL 21  11  10    Creatinine 0.44 - 1.00 mg/dL 6.38  7.56  4.33   Sodium 135 - 145 mmol/L 136  137  138   Potassium 3.5 - 5.1 mmol/L 4.1  4.2  4.5   Chloride 98 - 111 mmol/L 109  109  110   CO2 22 - 32 mmol/L 20  21  23    Calcium 8.9 - 10.3 mg/dL 8.8  8.9  8.7   Total Protein 6.5 - 8.1 g/dL   7.8   Total Bilirubin 0.3 - 1.2 mg/dL   0.6   Alkaline Phos 38 - 126 U/L   72   AST 15 - 41 U/L   15   ALT 0 - 44 U/L   12     Lipid Panel     Component Value Date/Time   CHOL 192 10/16/2021 0506   CHOL 238 (H) 03/30/2020 1128   TRIG 51 10/16/2021 0506   HDL 99  10/16/2021 0506   HDL 45 03/30/2020 1128   CHOLHDL 1.9 10/16/2021 0506   VLDL 10 10/16/2021 0506   LDLCALC 83 10/16/2021 0506   LDLCALC 167 (H) 03/30/2020 1128    CBC    Component Value Date/Time   WBC 17.3 (H) 01/10/2022 0544   RBC 4.39 01/10/2022 0544  HGB 10.9 (L) 01/10/2022 0544   HGB 10.4 (L) 01/22/2018 1403   HCT 37.5 01/10/2022 0544   HCT 33.3 (L) 01/22/2018 1403   PLT 417 (H) 01/10/2022 0544   PLT 616 (H) 01/22/2018 1403   MCV 85.4 01/10/2022 0544   MCV 82 01/22/2018 1403   MCH 24.8 (L) 01/10/2022 0544   MCHC 29.1 (L) 01/10/2022 0544   RDW 20.0 (H) 01/10/2022 0544   RDW 17.1 (H) 01/22/2018 1403   LYMPHSABS 5.4 (H) 01/07/2022 2342   LYMPHSABS 3.3 (H) 01/22/2018 1403   MONOABS 1.3 (H) 01/07/2022 2342   EOSABS 0.6 (H) 01/07/2022 2342   EOSABS 0.3 01/22/2018 1403   BASOSABS 0.1 01/07/2022 2342   BASOSABS 0.1 01/22/2018 1403    Lab Results  Component Value Date   HGBA1C 5.5 03/11/2022    Lab Results  Component Value Date   TSH 2.686 10/23/2021    Assessment & Plan:  1. Type 2 diabetes mellitus without complication, without long-term current use of insulin (HCC) Diet controlled with A1c of 5.5 Counseled on Diabetic diet, my plate method, 829 minutes of moderate intensity exercise/week Blood sugar logs with fasting goals of 80-120 mg/dl, random of less than 562 and in the event of sugars less than 60 mg/dl or greater than 130 mg/dl encouraged to notify the clinic. Advised on the need for annual eye exams, annual foot exams, Pneumonia vaccine. - POCT glucose (manual entry) - POCT glycosylated hemoglobin (Hb A1C) - Microalbumin/Creatinine Ratio, Urine  2. Sciatica of left side Could explain her left-sided pain We will check potassium level in the event that hypokalemia could be contributing Advised to apply heat and use of muscle relaxant - Basic Metabolic Panel  3. Helicobacter pylori gastritis Completed treatment with omeprazole, amoxicillin and  clarithromycin She is due for repeat testing to confirm eradication  4. Hypertension associated with diabetes (HCC) Controlled Counseled on blood pressure goal of less than 130/80, low-sodium, DASH diet, medication compliance, 150 minutes of moderate intensity exercise per week. Discussed medication compliance, adverse effects. - amLODipine (NORVASC) 10 MG tablet; Take 1 tablet (10 mg total) by mouth daily.  Dispense: 90 tablet; Refill: 1  5. Moderate episode of recurrent major depressive disorder (HCC) Controlled Followed by psychiatry  6. AF (paroxysmal atrial fibrillation) (HCC) Currently in sinus rhythm Continue rate control with carvedilol and anticoagulation with Eliquis  7. Moderate persistent asthma with acute exacerbation Stable with chronic residual wheeze Continue Tezspire, inhalers Follow-up with pulmonary  8. Morbid obesity due to excess calories (HCC) Currently on phentermine which is prescribed by [facility] medical Continue to work on cutting out caloric intake Unfortunately exercise is limited by her chronic knee pain.   Health Care Maintenance: she states she had colonoscopy with [facility] and we will need to retrieve the result. No orders of the defined types were placed in this encounter.   Return in about 3 months (around 06/11/2022) for Chronic medical conditions.       Hoy Register, MD, FAAFP. [facility] [place], [place] [phone]   03/11/2022, 2:52 PM

## 2022-03-11 NOTE — Telephone Encounter (Signed)
I met with the patient when she was in the clinic today and explained the importance of obtaining a CCA from behavioral health. I explained to her that if behavioral health has not done one yet, she can request that they complete one. . I also explained to her that I have not heard back from [facility] regarding the document she misplaced. She said she understood and will wait for Kristy/ [place] to meet with her again to obtain another copy of the document she misplaced.    The patient  was in agreement to re-submitting the TCL referral. I reminded her that there is a 30 day time frame to obtain all information needed to complete the referral and she said she understood

## 2022-03-12 ENCOUNTER — Encounter: Payer: Self-pay | Admitting: Family Medicine

## 2022-03-12 ENCOUNTER — Other Ambulatory Visit (HOSPITAL_COMMUNITY): Payer: Self-pay

## 2022-03-12 ENCOUNTER — Telehealth: Payer: Self-pay

## 2022-03-12 LAB — BASIC METABOLIC PANEL
BUN/Creatinine Ratio: 14 (ref 9–23)
BUN: 13 mg/dL (ref 6–24)
CO2: 18 mmol/L — ABNORMAL LOW (ref 20–29)
Calcium: 9.2 mg/dL (ref 8.7–10.2)
Chloride: 106 mmol/L (ref 96–106)
Creatinine, Ser: 0.94 mg/dL (ref 0.57–1.00)
Glucose: 76 mg/dL (ref 70–99)
Potassium: 4.8 mmol/L (ref 3.5–5.2)
Sodium: 139 mmol/L (ref 134–144)
eGFR: 74 mL/min/{1.73_m2} (ref >59)

## 2022-03-12 NOTE — Telephone Encounter (Signed)
Requested medication (s) are due for refill today-no  Requested medication (s) are on the active medication list -no  Future visit scheduled -yes  Last refill: 12/11/21  Notes to clinic: medication no longer on current medication list  Requested Prescriptions  Pending Prescriptions Disp Refills   montelukast (SINGULAIR) 10 MG tablet [Pharmacy Med Name: MONTELUKAST '10MG'$  TABLETS] 90 tablet 0    Sig: TAKE 1 TABLET(10 MG) BY MOUTH AT BEDTIME     Pulmonology:  Leukotriene Inhibitors Passed - 03/11/2022  6:23 AM      Passed - Valid encounter within last 12 months    Recent Outpatient Visits           Yesterday Type 2 diabetes mellitus without complication, without long-term current use of insulin ([place])   [place], Charlane Ferretti, MD   1 month ago Encounter for [employer] annual wellness exam   [place], [place], MD   3 months ago Type 2 diabetes mellitus without complication, without long-term current use of insulin ([place])   [place], Charlane Ferretti, MD   9 months ago Screening for colon cancer   [place], Charlane Ferretti, MD   1 year ago Moderate persistent asthma with acute exacerbation   [place], Charlane Ferretti, MD       Future Appointments             In 3 months [name], MD [place]               Requested Prescriptions  Pending Prescriptions Disp Refills   montelukast (SINGULAIR) 10 MG tablet [Pharmacy Med Name: MONTELUKAST '10MG'$  TABLETS] 90 tablet 0    Sig: TAKE 1 TABLET(10 MG) BY MOUTH AT BEDTIME     Pulmonology:  Leukotriene Inhibitors Passed - 03/11/2022  6:23 AM      Passed - Valid encounter within last 12 months    Recent Outpatient Visits           Yesterday Type 2 diabetes mellitus without complication, without long-term current use of insulin  ([place])   [place], Charlane Ferretti, MD   1 month ago Encounter for [employer] annual wellness exam   [place], [place], MD   3 months ago Type 2 diabetes mellitus without complication, without long-term current use of insulin ([place])   [place] [name], MD   9 months ago Screening for colon cancer   [place], Charlane Ferretti, MD   1 year ago Moderate persistent asthma with acute exacerbation   [place], [place], MD       Future Appointments             In 3 months [name], MD [place]

## 2022-03-12 NOTE — Telephone Encounter (Signed)
New referral submitted  to TCL program. Referral ID: [id number]

## 2022-03-12 NOTE — Telephone Encounter (Signed)
Opened in error

## 2022-03-14 ENCOUNTER — Ambulatory Visit (HOSPITAL_COMMUNITY): Payer: Medicare Other | Admitting: Psychiatry

## 2022-03-15 ENCOUNTER — Other Ambulatory Visit (HOSPITAL_COMMUNITY): Payer: Self-pay

## 2022-03-17 ENCOUNTER — Other Ambulatory Visit: Payer: Self-pay | Admitting: Family Medicine

## 2022-03-20 ENCOUNTER — Ambulatory Visit (INDEPENDENT_AMBULATORY_CARE_PROVIDER_SITE_OTHER): Payer: Medicare Other | Admitting: Licensed Clinical Social Worker

## 2022-03-20 DIAGNOSIS — F411 Generalized anxiety disorder: Secondary | ICD-10-CM | POA: Diagnosis not present

## 2022-03-20 DIAGNOSIS — F4310 Post-traumatic stress disorder, unspecified: Secondary | ICD-10-CM | POA: Diagnosis not present

## 2022-03-20 DIAGNOSIS — F331 Major depressive disorder, recurrent, moderate: Secondary | ICD-10-CM | POA: Diagnosis not present

## 2022-03-20 NOTE — Plan of Care (Signed)
  Problem: Depression Goal:  Decrease depressive symptoms and improve levels of effective functioning-pt reports a decrease in overall depression symptoms 3 out of 5 sessions documented.  Outcome: Progressing Goal: Develop healthy thinking patterns and beliefs about self, others, and the world that lead to the alleviation and help prevent the relapse of depression per self report 3 out of 5 sessions documented.   Outcome: Progressing   Problem: Anxiety  Goal: Reduce overall frequency, intensity, and duration of the anxiety so that daily functioning is not impaired per pt self report 3 out of 5 sessions documented.   Outcome: Progressing Goal: Learn and implement coping skills that result in a reduction of anxiety and worry, and improve daily functioning per pt report 3 out of 5 sessions documented  Outcome: Progressing

## 2022-03-20 NOTE — Plan of Care (Signed)
  Problem: Depression Goal:  Decrease depressive symptoms and improve levels of effective functioning-pt reports a decrease in overall depression symptoms 3 out of 5 sessions documented.  Outcome: Not Progressing Goal: Develop healthy thinking patterns and beliefs about self, others, and the world that lead to the alleviation and help prevent the relapse of depression per self report 3 out of 5 sessions documented.   Outcome: Progressing   Problem: Anxiety  Goal: Reduce overall frequency, intensity, and duration of the anxiety so that daily functioning is not impaired per pt self report 3 out of 5 sessions documented.   Outcome: Progressing Goal: Learn and implement coping skills that result in a reduction of anxiety and worry, and improve daily functioning per pt report 3 out of 5 sessions documented  Outcome: Progressing

## 2022-03-20 NOTE — Progress Notes (Signed)
THERAPIST PROGRESS NOTE  Session Time: [place] in office visit for patient and LCSW clinician  Participation Level: Active  Behavioral Response: CasualAlertAnxious and Depressed  Type of Therapy: Individual Therapy  Treatment Goals addressed:      Problem: Depression Goal:  Decrease depressive symptoms and improve levels of effective functioning-pt reports a decrease in overall depression symptoms 3 out of 5 sessions documented.  Outcome: Not Progressing Goal: Develop healthy thinking patterns and beliefs about self, others, and the world that lead to the alleviation and help prevent the relapse of depression per self report 3 out of 5 sessions documented.   Outcome: Progressing   Problem: Anxiety  Goal: Reduce overall frequency, intensity, and duration of the anxiety so that daily functioning is not impaired per pt self report 3 out of 5 sessions documented.   Outcome: Progressing Goal: Learn and implement coping skills that result in a reduction of anxiety and worry, and improve daily functioning per pt report 3 out of 5 sessions documented  Outcome: Progressing        ProgressTowards Goals: Progressing  Interventions: Solution Focused and Supportive  Summary: April [name] is a 51 y.o. female who presents with continuing symptoms related to depression and trauma/anxiety.   Allowed pt to explore and express thoughts and feelings associated with recent life situations and external stressors.Patient reports that her current mood status has not changed and she is continuing to feel sad and depressed at times. Patient reports that she really does not feel like being around other people, or doing things outside of her home. Patient reports her energy levels are low, and she just wants to stay in bed all the time. Patient reports that she is compliant with her medication, but feels that it is not helpful.  Patient is cooperative and engaged  throughout session, and presents with the depressed mood and flat affect. Patient reports that she will be spending Thanksgiving with her mom, brother, and sister.  Discussed patients current housing situation, and how she's trying to get housing assistance from [place]. Patient reports that April [name] needs a CCA--comprehensive clinical assessment. Informed patient of the procedure that she needs to follow to get a copy of the CCA that we have at [facility], or for patient to call [facility] to see if there is another provider that can assist her with obtaining a new CCA. Patient reports that it is only one page of information, but CC's are typically very comprehensive and informative document. Discussed patients need to sign a release of information, and patient is cooperative. However at the end of session patient did not want to sign the ROI.  Reviewed coping mechanisms for managing depression symptoms and encourage patient to continue with medication compliance, and positive behavioral activation strategies. Patient reflects understanding and willingness to cooperate.  Continued recommendations are as follows: self care behaviors, positive social engagements, focusing on overall work/home/life balance, and focusing on positive physical and emotional wellness.   Suicidal/Homicidal: No  Therapist Response: Pt is continuing to apply interventions learned in session into daily life situations. Pt is currently on track to meet goals utilizing interventions mentioned above. Personal growth and progress noted. Treatment to continue as indicated.   Plan: Return again in 4 weeks.  Diagnosis:  Encounter Diagnoses  Name Primary?   Moderate episode of recurrent major depressive disorder (HCC) Yes   PTSD (post-traumatic stress disorder)    Generalized anxiety disorder    Collaboration of Care: Other  pt encouraged to continue care with psychiatric provider of record, Dr. Armando Reichert.    Patient/Guardian was advised Release of Information must be obtained prior to any record release in order to collaborate their care with an outside provider. Patient/Guardian was advised if they have not already done so to contact the registration department to sign all necessary forms in order for [place] to release information regarding their care.   Consent: Patient/Guardian gives verbal consent for treatment and assignment of benefits for [name] provided during this visit. Patient/Guardian expressed understanding and agreed to proceed.   [place], LCSW 03/20/2022

## 2022-03-25 ENCOUNTER — Other Ambulatory Visit (HOSPITAL_COMMUNITY): Payer: Self-pay

## 2022-03-25 NOTE — Telephone Encounter (Signed)
FL2, provider's visit note and income statement sent via [place] email to [employer], LCSW/[facility]- TCL program.

## 2022-03-26 ENCOUNTER — Other Ambulatory Visit (HOSPITAL_COMMUNITY): Payer: Self-pay

## 2022-03-26 ENCOUNTER — Telehealth: Payer: Self-pay | Admitting: Family Medicine

## 2022-03-26 MED ORDER — OXYCODONE-ACETAMINOPHEN 10-325 MG PO TABS
1.0000 | ORAL_TABLET | Freq: Every day | ORAL | 0 refills | Status: DC | PRN
  Filled 2022-03-26: qty 150, 30d supply, fill #0

## 2022-03-26 NOTE — Telephone Encounter (Signed)
Copied from [place] [id number]. Topic: General - Inquiry >> Mar 26, 2022  2:17 PM Rosanne Ashing P wrote: Reason for CRM: Pt would like April [name] to call her back about a man that came to see her today.  \ CB@  [phone]

## 2022-03-26 NOTE — Telephone Encounter (Signed)
I returned the call to the patient and she said that [place] program came to see her today.  She said that the meeting went well and he told her that she does not need a CCA from behavioral health all he would need is the medical information from PCP.  I explained to the patient that I sent the clinical information from PCP  to [facility] and will check to see if Frankey Poot received that.  Secure email sent to [employer] inquiring if he received the information from [place] [facility].  I also wanted to confirm that the patient does not need a CCA.

## 2022-03-27 ENCOUNTER — Ambulatory Visit (HOSPITAL_COMMUNITY): Payer: Medicare Other | Admitting: Psychiatry

## 2022-03-27 NOTE — Telephone Encounter (Signed)
Message received from [employer] stating that he received the information from [employer]. He is not sure if the patient will need a CCA. I asked that he contact the patient if a CCA is needed because she is under the impression that one is not needed

## 2022-03-29 NOTE — Telephone Encounter (Signed)
I called the patient to inquire if she has heard anything from [facility] and she said no but she will call him on Monday, 12/4 to check on the status of her TCL application and ask if a CCA is necessary.

## 2022-04-01 ENCOUNTER — Ambulatory Visit: Payer: Medicare Other

## 2022-04-03 NOTE — Telephone Encounter (Signed)
At her last visit she had informed me she had stopped taking the daily prednisone prescribed by pulmonary.  She might want to start taking that again to prevent an acute flare.  Please advised to keep appointment with pulmonary.  Thank you

## 2022-04-03 NOTE — Telephone Encounter (Signed)
Call received from patient.  She explained that she has been approved for the TCL program and they should be in contact with her soon to discuss housing needs.  She has the phone number or Frankey Poot [employer] if she has questions about the program going forward.   She also said that her asthma is starting to bother her.  She has been taking all of her medications, including nebulizer treatments,  as prescribed and she is trying to stay out of the hospital.  She has O2. No fever reported. She stated she is well aware of when she needs to seek medical attention and will go to ED if needed this evening.  She said that if she went to Urgent Care they would just send her to the ED.

## 2022-04-04 ENCOUNTER — Encounter (HOSPITAL_COMMUNITY): Payer: Self-pay

## 2022-04-04 ENCOUNTER — Other Ambulatory Visit: Payer: Self-pay

## 2022-04-04 ENCOUNTER — Inpatient Hospital Stay (HOSPITAL_COMMUNITY)
Admission: EM | Admit: 2022-04-04 | Discharge: 2022-04-07 | DRG: 191 | Disposition: A | Discharge status: Home / Self Care | Payer: Medicare Other | Attending: Internal Medicine | Admitting: Internal Medicine

## 2022-04-04 ENCOUNTER — Emergency Department (HOSPITAL_COMMUNITY): Payer: Medicare Other

## 2022-04-04 DIAGNOSIS — J441 Chronic obstructive pulmonary disease with (acute) exacerbation: Secondary | ICD-10-CM | POA: Diagnosis not present

## 2022-04-04 DIAGNOSIS — G894 Chronic pain syndrome: Secondary | ICD-10-CM | POA: Diagnosis present

## 2022-04-04 DIAGNOSIS — Z825 Family history of asthma and other chronic lower respiratory diseases: Secondary | ICD-10-CM

## 2022-04-04 DIAGNOSIS — Z7951 Long term (current) use of inhaled steroids: Secondary | ICD-10-CM

## 2022-04-04 DIAGNOSIS — J4551 Severe persistent asthma with (acute) exacerbation: Secondary | ICD-10-CM | POA: Diagnosis present

## 2022-04-04 DIAGNOSIS — M170 Bilateral primary osteoarthritis of knee: Secondary | ICD-10-CM | POA: Diagnosis present

## 2022-04-04 DIAGNOSIS — Z6841 Body Mass Index (BMI) 40.0 and over, adult: Secondary | ICD-10-CM

## 2022-04-04 DIAGNOSIS — R7303 Prediabetes: Secondary | ICD-10-CM | POA: Diagnosis present

## 2022-04-04 DIAGNOSIS — M069 Rheumatoid arthritis, unspecified: Secondary | ICD-10-CM | POA: Diagnosis present

## 2022-04-04 DIAGNOSIS — Z7901 Long term (current) use of anticoagulants: Secondary | ICD-10-CM

## 2022-04-04 DIAGNOSIS — I480 Paroxysmal atrial fibrillation: Secondary | ICD-10-CM | POA: Diagnosis present

## 2022-04-04 DIAGNOSIS — K219 Gastro-esophageal reflux disease without esophagitis: Secondary | ICD-10-CM | POA: Diagnosis present

## 2022-04-04 DIAGNOSIS — J4489 Other specified chronic obstructive pulmonary disease: Principal | ICD-10-CM | POA: Diagnosis present

## 2022-04-04 DIAGNOSIS — Z809 Family history of malignant neoplasm, unspecified: Secondary | ICD-10-CM

## 2022-04-04 DIAGNOSIS — I110 Hypertensive heart disease with heart failure: Secondary | ICD-10-CM | POA: Diagnosis present

## 2022-04-04 DIAGNOSIS — Z1152 Encounter for screening for COVID-19: Secondary | ICD-10-CM

## 2022-04-04 DIAGNOSIS — E039 Hypothyroidism, unspecified: Secondary | ICD-10-CM | POA: Diagnosis present

## 2022-04-04 DIAGNOSIS — I10 Essential (primary) hypertension: Secondary | ICD-10-CM | POA: Diagnosis present

## 2022-04-04 DIAGNOSIS — E876 Hypokalemia: Secondary | ICD-10-CM | POA: Diagnosis present

## 2022-04-04 DIAGNOSIS — G4733 Obstructive sleep apnea (adult) (pediatric): Secondary | ICD-10-CM | POA: Diagnosis present

## 2022-04-04 DIAGNOSIS — Z79890 Hormone replacement therapy (postmenopausal): Secondary | ICD-10-CM

## 2022-04-04 DIAGNOSIS — I5032 Chronic diastolic (congestive) heart failure: Secondary | ICD-10-CM | POA: Diagnosis present

## 2022-04-04 DIAGNOSIS — Z7985 Long-term (current) use of injectable non-insulin antidiabetic drugs: Secondary | ICD-10-CM

## 2022-04-04 DIAGNOSIS — Z9981 Dependence on supplemental oxygen: Secondary | ICD-10-CM

## 2022-04-04 DIAGNOSIS — Z8249 Family history of ischemic heart disease and other diseases of the circulatory system: Secondary | ICD-10-CM

## 2022-04-04 DIAGNOSIS — F419 Anxiety disorder, unspecified: Secondary | ICD-10-CM | POA: Diagnosis present

## 2022-04-04 DIAGNOSIS — Z7722 Contact with and (suspected) exposure to environmental tobacco smoke (acute) (chronic): Secondary | ICD-10-CM | POA: Diagnosis present

## 2022-04-04 DIAGNOSIS — Z7952 Long term (current) use of systemic steroids: Secondary | ICD-10-CM

## 2022-04-04 DIAGNOSIS — Z79899 Other long term (current) drug therapy: Secondary | ICD-10-CM

## 2022-04-04 DIAGNOSIS — F32A Depression, unspecified: Secondary | ICD-10-CM | POA: Diagnosis present

## 2022-04-04 LAB — CBC WITH DIFFERENTIAL/PLATELET
Abs Immature Granulocytes: 0.08 10*3/uL — ABNORMAL HIGH (ref 0.00–0.07)
Basophils Absolute: 0.1 10*3/uL (ref 0.0–0.1)
Basophils Relative: 1 %
Eosinophils Absolute: 0.1 10*3/uL (ref 0.0–0.5)
Eosinophils Relative: 1 %
HCT: 37.3 % (ref 36.0–46.0)
Hemoglobin: 11.4 g/dL — ABNORMAL LOW (ref 12.0–15.0)
Immature Granulocytes: 0 %
Lymphocytes Relative: 38 %
Lymphs Abs: 7.3 10*3/uL — ABNORMAL HIGH (ref 0.7–4.0)
MCH: 25.2 pg — ABNORMAL LOW (ref 26.0–34.0)
MCHC: 30.6 g/dL (ref 30.0–36.0)
MCV: 82.5 fL (ref 80.0–100.0)
Monocytes Absolute: 1.6 10*3/uL — ABNORMAL HIGH (ref 0.1–1.0)
Monocytes Relative: 8 %
Neutro Abs: 10.1 10*3/uL — ABNORMAL HIGH (ref 1.7–7.7)
Neutrophils Relative %: 52 %
Platelets: 362 10*3/uL (ref 150–400)
RBC: 4.52 MIL/uL (ref 3.87–5.11)
RDW: 18.9 % — ABNORMAL HIGH (ref 11.5–15.5)
WBC: 19.3 10*3/uL — ABNORMAL HIGH (ref 4.0–10.5)
nRBC: 0 % (ref 0.0–0.2)

## 2022-04-04 LAB — RESPIRATORY PANEL BY PCR

## 2022-04-04 LAB — RESP PANEL BY RT-PCR (FLU A&B, COVID) ARPGX2
Influenza A by PCR: NEGATIVE
Influenza B by PCR: NEGATIVE
SARS Coronavirus 2 by RT PCR: NEGATIVE

## 2022-04-04 LAB — BASIC METABOLIC PANEL
Anion gap: 10 (ref 5–15)
BUN: 11 mg/dL (ref 6–20)
CO2: 21 mmol/L — ABNORMAL LOW (ref 22–32)
Calcium: 8.4 mg/dL — ABNORMAL LOW (ref 8.9–10.3)
Chloride: 110 mmol/L (ref 98–111)
Creatinine, Ser: 0.94 mg/dL (ref 0.44–1.00)
GFR, Estimated: >60 mL/min (ref >60)
Glucose, Bld: 92 mg/dL (ref 70–99)
Potassium: 2.9 mmol/L — ABNORMAL LOW (ref 3.5–5.1)
Sodium: 141 mmol/L (ref 135–145)

## 2022-04-04 LAB — TROPONIN I (HIGH SENSITIVITY)
Troponin I (High Sensitivity): 3 ng/L (ref <18)
Troponin I (High Sensitivity): 3 ng/L (ref <18)

## 2022-04-04 LAB — MAGNESIUM: Magnesium: 2 mg/dL (ref 1.7–2.4)

## 2022-04-04 LAB — BRAIN NATRIURETIC PEPTIDE: B Natriuretic Peptide: 95.5 pg/mL (ref 0.0–100.0)

## 2022-04-04 LAB — HIV ANTIBODY (ROUTINE TESTING W REFLEX): HIV Screen 4th Generation wRfx: NONREACTIVE

## 2022-04-04 MED ORDER — PREDNISONE 20 MG PO TABS: 40.0000 mg | ORAL_TABLET | Freq: Every day | ORAL | Status: DC

## 2022-04-04 MED ORDER — POTASSIUM CHLORIDE CRYS ER 20 MEQ PO TBCR
40.0000 meq | EXTENDED_RELEASE_TABLET | Freq: Two times a day (BID) | ORAL | Status: AC
  Administered 2022-04-04 – 2022-04-05 (×3): 40 meq via ORAL
  Filled 2022-04-04 (×3): qty 2

## 2022-04-04 MED ORDER — METHYLPREDNISOLONE SODIUM SUCC 125 MG IJ SOLR
125.0000 mg | Freq: Once | INTRAMUSCULAR | Status: AC
  Administered 2022-04-04: 125 mg via INTRAVENOUS
  Filled 2022-04-04: qty 2

## 2022-04-04 MED ORDER — GUAIFENESIN ER 600 MG PO TB12
600.0000 mg | ORAL_TABLET | Freq: Two times a day (BID) | ORAL | Status: DC
  Administered 2022-04-04 – 2022-04-07 (×7): 600 mg via ORAL
  Filled 2022-04-04 (×7): qty 1

## 2022-04-04 MED ORDER — ACETAMINOPHEN 650 MG RE SUPP: 650.0000 mg | Freq: Four times a day (QID) | RECTAL | Status: DC | PRN

## 2022-04-04 MED ORDER — OXYCODONE HCL 5 MG PO TABS
5.0000 mg | ORAL_TABLET | Freq: Four times a day (QID) | ORAL | Status: DC | PRN
  Administered 2022-04-04 – 2022-04-07 (×8): 5 mg via ORAL
  Filled 2022-04-04 (×8): qty 1

## 2022-04-04 MED ORDER — ACETAMINOPHEN 325 MG PO TABS
650.0000 mg | ORAL_TABLET | Freq: Four times a day (QID) | ORAL | Status: DC | PRN
  Administered 2022-04-04 – 2022-04-05 (×2): 650 mg via ORAL
  Filled 2022-04-04 (×2): qty 2

## 2022-04-04 MED ORDER — HYDRALAZINE HCL 20 MG/ML IJ SOLN: 10.0000 mg | Freq: Once | INTRAMUSCULAR | Status: DC | PRN

## 2022-04-04 MED ORDER — CLONAZEPAM 0.5 MG PO TABS
0.5000 mg | ORAL_TABLET | Freq: Once | ORAL | Status: AC
  Administered 2022-04-04: 0.5 mg via ORAL
  Filled 2022-04-04: qty 1

## 2022-04-04 MED ORDER — ALUM & MAG HYDROXIDE-SIMETH 200-200-20 MG/5ML PO SUSP
30.0000 mL | Freq: Four times a day (QID) | ORAL | Status: AC | PRN
  Administered 2022-04-05 – 2022-04-06 (×2): 30 mL via ORAL
  Filled 2022-04-04 (×2): qty 30

## 2022-04-04 MED ORDER — OXYCODONE-ACETAMINOPHEN 5-325 MG PO TABS
1.0000 | ORAL_TABLET | Freq: Four times a day (QID) | ORAL | Status: DC | PRN
  Administered 2022-04-04 – 2022-04-07 (×8): 1 via ORAL
  Filled 2022-04-04 (×8): qty 1

## 2022-04-04 MED ORDER — IPRATROPIUM-ALBUTEROL 0.5-2.5 (3) MG/3ML IN SOLN
3.0000 mL | Freq: Four times a day (QID) | RESPIRATORY_TRACT | Status: DC
  Administered 2022-04-04 – 2022-04-05 (×3): 3 mL via RESPIRATORY_TRACT
  Filled 2022-04-04 (×3): qty 3

## 2022-04-04 MED ORDER — ALBUTEROL SULFATE (2.5 MG/3ML) 0.083% IN NEBU
10.0000 mg/h | INHALATION_SOLUTION | Freq: Once | RESPIRATORY_TRACT | Status: AC
  Administered 2022-04-04: 10 mg/h via RESPIRATORY_TRACT
  Filled 2022-04-04: qty 9
  Filled 2022-04-04 (×2): qty 12

## 2022-04-04 MED ORDER — MELATONIN 5 MG PO TABS
5.0000 mg | ORAL_TABLET | Freq: Once | ORAL | Status: AC
  Administered 2022-04-04: 5 mg via ORAL
  Filled 2022-04-04: qty 1

## 2022-04-04 MED ORDER — HYDRALAZINE HCL 20 MG/ML IJ SOLN: 10.0000 mg | Freq: Once | INTRAMUSCULAR | Status: DC

## 2022-04-04 MED ORDER — METHYLPREDNISOLONE SODIUM SUCC 40 MG IJ SOLR
40.0000 mg | Freq: Two times a day (BID) | INTRAMUSCULAR | Status: DC
  Administered 2022-04-04: 40 mg via INTRAVENOUS
  Filled 2022-04-04: qty 1

## 2022-04-04 NOTE — ED Provider Notes (Signed)
[facility] DEPT Provider Note   CSN: [id number] Arrival date & time: 04/04/22  0441     History  Chief Complaint  Patient presents with   Shortness of Breath    April [name] is a 51 y.o. female.  Patient presents to the emergency department for evaluation of shortness of breath.  Patient reports a prior history of COPD.  She has been congested with a cough and increasing shortness of breath for the last couple of days.  Patient normally on 4 to 5 L by nasal cannula at home.  She has brought to the ER by ambulance.  She has been given a DuoNeb during transport but has not felt any improvement.       Home Medications Prior to Admission medications   Medication Sig Start Date End Date Taking? Authorizing Provider  albuterol (PROAIR HFA) 108 (90 Base) MCG/ACT inhaler Inhale 2 puffs into the lungs every 6 (six) hours as needed for wheezing or shortness of breath. Patient taking differently: Inhale 2 puffs into the lungs 3 (three) times daily as needed for wheezing or shortness of breath. 11/13/21   Baird Lyons D, MD  amLODipine (NORVASC) 10 MG tablet Take 1 tablet (10 mg total) by mouth daily. 03/11/22   [name], MD  apixaban (ELIQUIS) 5 MG TABS tablet Take 1 tablet (5 mg total) by mouth 2 (two) times daily. 11/28/21   [name], MD  carvedilol (COREG) 6.25 MG tablet TAKE 1 TABLET(6.25 MG) BY MOUTH TWICE DAILY WITH A MEAL 03/18/22   [name], MD  clonazePAM (KLONOPIN) 0.5 MG tablet Take 1 tablet (0.5 mg total) by mouth daily as needed for anxiety. Patient taking differently: Take 0.5 mg by mouth 2 (two) times daily as needed for anxiety. 10/18/21   Florencia Reasons, MD  cloNIDine (CATAPRES) 0.2 MG tablet Take 1 tablet (0.2 mg total) by mouth 2 (two) times daily. 03/11/22   [name], MD  COMBIVENT RESPIMAT 20-100 MCG/ACT AERS respimat Inhale 1 puff into the lungs as needed for wheezing or shortness of breath. 12/28/21   [provider]  diclofenac Sodium (VOLTAREN) 1 % GEL Apply 1 Application topically as needed (pain).    [provider]  DULoxetine (CYMBALTA) 30 MG capsule Take 1 capsule (30 mg total) by mouth daily. 02/05/22   Armando Reichert, MD  EPINEPHrine 0.3 mg/0.3 mL IJ SOAJ injection Inject 0.3 mg into the muscle as needed for anaphylaxis. 05/30/20   Baird Lyons D, MD  FLUoxetine (PROZAC) 10 MG capsule Take 2 capsules (20 mg total) by mouth daily. Take 1 capsule for first 7 days and then start taking 2 capsules daily. 02/05/22   Armando Reichert, MD  fluticasone (FLONASE) 50 MCG/ACT nasal spray SHAKE LIQUID AND USE 1 SPRAY IN EACH NOSTRIL DAILY AS NEEDED FOR ALLERGIES OR RHINITIS Patient taking differently: Place 1 spray into both nostrils daily. 08/05/21   [name], MD  Fluticasone-Umeclidin-Vilant 100-62.5-25 MCG/ACT AEPB Inhale 1 puff into the lungs daily. 10/26/21   Charlynne Cousins, MD  furosemide (LASIX) 20 MG tablet Take 1 tablet (20 mg total) by mouth daily. 11/28/21   [name], MD  ipratropium-albuterol (DUONEB) 0.5-2.5 (3) MG/3ML SOLN 1 neb every 6 hours if needed for asthma 01/28/22   Baird Lyons D, MD  levothyroxine (SYNTHROID) 50 MCG tablet Take 1 tablet (50 mcg total) by mouth daily. 11/28/21   [name], MD  Misc. Devices MISC Bedside commode 01/25/22   [name], MD  Misc. Devices [place] Shower chair Grab bar for the shower Diagnosis:  bilateral knee osteoarthritis 01/30/22   [name], MD  naloxone [facility]) nasal spray 4 mg/0.1 mL Place 1 spray into the nose once as needed (opioid overdose). 08/09/20   [provider]  omeprazole (PRILOSEC) 20 MG capsule Take 1 capsule (20 mg total) by mouth 2 (two) times daily before a meal. 02/01/22   [name], MD  oxyCODONE-acetaminophen (PERCOCET) 10-325 MG tablet Take 1 tablet by mouth 5 (five) times daily as needed (nerve pain). 08/01/21   [provider]  oxyCODONE-acetaminophen (PERCOCET) 10-325 MG tablet  Take 1 tablet by mouth 5 (five) times daily as needed for pain. 03/26/22     phentermine 15 MG capsule Take 15 mg by mouth daily. 08/10/21   [provider]  potassium chloride SA (KLOR-CON M) 20 MEQ tablet TAKE 1 TABLET(20 MEQ) BY MOUTH DAILY 02/01/22   [name], MD  predniSONE (DELTASONE) 10 MG tablet 1 daily if needed 01/28/22   Baird Lyons D, MD  pregabalin (LYRICA) 300 MG capsule Take 300 mg by mouth in the morning, at noon, and at bedtime. 01/08/21   [provider]  Semaglutide, 1 MG/DOSE, (OZEMPIC, 1 MG/DOSE,) 4 MG/3ML SOPN Inject 1 mg into the skin once a week.    [provider]  Tezepelumab-ekko (TEZSPIRE) 210 MG/1.91ML SOAJ Inject 210 mg into the skin every 28 (twenty-eight) days. 11/05/21   Baird Lyons D, MD  topiramate (TOPAMAX) 200 MG tablet Take 200 mg by mouth at bedtime. 01/08/21   [provider]  Vitamin D, Ergocalciferol, (DRISDOL) 1.25 MG (50000 UNIT) CAPS capsule Take 50,000 Units by mouth once a week. 11/30/21   [provider]  zolpidem (AMBIEN) 10 MG tablet TAKE 1 TABLET BY MOUTH AT BEDTIME AS NEEDED FOR SLEEP 01/28/22   Young, Clinton D, MD  mometasone-formoterol [facility]) 200-5 MCG/ACT AERO Inhale 2 puffs into the lungs 2 (two) times daily. Dx: Asthma 04/08/19 08/12/19  Deneise Lever, MD      Allergies    Contrast media [iodinated contrast media]    Review of Systems   Review of Systems  Physical Exam Updated Vital Signs BP (!) 147/81   Pulse 93   Temp 97.9 F (36.6 C) (Oral)   Resp (!) 28   Ht '5\' 6"'$  (1.676 m)   Wt 131.5 kg   LMP 03/14/2022 (Approximate)   SpO2 100%   BMI 46.81 kg/m  Physical Exam Vitals and nursing note reviewed.  Constitutional:      General: She is not in acute distress.    Appearance: She is well-developed.  HENT:     Head: Normocephalic and atraumatic.     Mouth/Throat:     Mouth: Mucous membranes are moist.  Eyes:     General: Vision grossly intact. Gaze aligned  appropriately.     Extraocular Movements: Extraocular movements intact.     Conjunctiva/sclera: Conjunctivae normal.  Cardiovascular:     Rate and Rhythm: Normal rate and regular rhythm.     Pulses: Normal pulses.     Heart sounds: Normal heart sounds, S1 normal and S2 normal. No murmur heard.    No friction rub. No gallop.  Pulmonary:     Effort: Tachypnea and accessory muscle usage present. No respiratory distress.     Breath sounds: Decreased breath sounds and wheezing present.  Abdominal:     General: Bowel sounds are normal.     Palpations: Abdomen is soft.  Tenderness: There is no abdominal tenderness. There is no guarding or rebound.     Hernia: No hernia is present.  Musculoskeletal:        General: No swelling.     Cervical back: Full passive range of motion without pain, normal range of motion and neck supple. No spinous process tenderness or muscular tenderness. Normal range of motion.     Right lower leg: No edema.     Left lower leg: No edema.  Skin:    General: Skin is warm and dry.     Capillary Refill: Capillary refill takes less than 2 seconds.     Findings: No ecchymosis, erythema, rash or wound.  Neurological:     General: No focal deficit present.     Mental Status: She is alert and oriented to person, place, and time.     GCS: GCS eye subscore is 4. GCS verbal subscore is 5. GCS motor subscore is 6.     Cranial Nerves: Cranial nerves 2-12 are intact.     Sensory: Sensation is intact.     Motor: Motor function is intact.     Coordination: Coordination is intact.  Psychiatric:        Attention and Perception: Attention normal.        Mood and Affect: Mood normal.        Speech: Speech normal.        Behavior: Behavior normal.     ED Results / Procedures / Treatments   Labs (all labs ordered are listed, but only abnormal results are displayed) Labs Reviewed  CBC WITH DIFFERENTIAL/PLATELET - Abnormal; Notable for the following components:      Result  Value   WBC 19.3 (*)    Hemoglobin 11.4 (*)    MCH 25.2 (*)    RDW 18.9 (*)    All other components within normal limits  BASIC METABOLIC PANEL - Abnormal; Notable for the following components:   Potassium 2.9 (*)    CO2 21 (*)    Calcium 8.4 (*)    All other components within normal limits  RESP PANEL BY RT-PCR (FLU A&B, COVID) ARPGX2  BRAIN NATRIURETIC PEPTIDE  TROPONIN I (HIGH SENSITIVITY)  TROPONIN I (HIGH SENSITIVITY)    EKG EKG Interpretation  Date/Time:  Thursday April 04 2022 04:56:08 EST Ventricular Rate:  95 PR Interval:  172 QRS Duration: 97 QT Interval:  377 QTC Calculation: 474 R Axis:   43 Text Interpretation: Sinus rhythm Anteroseptal infarct, age indeterminate Confirmed by Orpah Greek [phone]) on 04/04/2022 5:40:37 AM  Radiology DG Chest Port 1 View  Result Date: 04/04/2022 CLINICAL DATA:  Shortness of breath for 4 days. EXAM: PORTABLE CHEST 1 VIEW COMPARISON:  01/07/2022 FINDINGS: 0510 hours. Cardiopericardial silhouette is at upper limits of normal for size. The lungs are clear without focal pneumonia, edema, pneumothorax or pleural effusion. Evaluation of lung parenchyma somewhat limited by technique and overlying soft tissue. Telemetry leads overlie the chest. IMPRESSION: No active disease. Electronically Signed   By: Misty Stanley M.D.   On: 04/04/2022 06:11    Procedures Procedures    Medications Ordered in ED Medications  albuterol (PROVENTIL) (2.5 MG/3ML) 0.083% nebulizer solution (10 mg/hr Nebulization Given 04/04/22 0541)  methylPREDNISolone sodium succinate (SOLU-MEDROL) 125 mg/2 mL injection 125 mg (125 mg Intravenous Given 04/04/22 0532)    ED Course/ Medical Decision Making/ A&P  Medical Decision Making Amount and/or Complexity of Data Reviewed External Data Reviewed: labs, radiology and notes. Labs: ordered. Decision-making details documented in ED Course. Radiology: ordered and independent  interpretation performed. Decision-making details documented in ED Course. ECG/medicine tests: ordered and independent interpretation performed. Decision-making details documented in ED Course.  Risk Prescription drug management.   Presents to the emergency department for evaluation of shortness of breath.  Patient has a history of COPD with chronic respiratory failure.  She has significant wheezing at arrival.  Oxygenation is normal on her supplemental oxygen.  Chest x-ray does not show evidence of pneumonia.  She has a reported history of congestive heart failure.  No fluid on x-ray, BNP normal.  Doubt CHF exacerbation.  Patient does report a cough.  Influenza, COVID-negative.  Patient given Solu-Medrol and a continuous nebulizer treatment.  She is still very dyspneic with significant bronchospasm throughout all lung fields.  Will require hospitalization for further management.  CRITICAL CARE Performed by: Orpah Greek   Total critical care time: 33 minutes  Critical care time was exclusive of separately billable procedures and treating other patients.  Critical care was necessary to treat or prevent imminent or life-threatening deterioration.  Critical care was time spent personally by me on the following activities: development of treatment plan with patient and/or surrogate as well as nursing, discussions with consultants, evaluation of patient's response to treatment, examination of patient, obtaining history from patient or surrogate, ordering and performing treatments and interventions, ordering and review of laboratory studies, ordering and review of radiographic studies, pulse oximetry and re-evaluation of patient's condition.         Final Clinical Impression(s) / ED Diagnoses Final diagnoses:  COPD exacerbation [facility])    Rx / DC Orders ED Discharge Orders     None         Acel Natzke, Gwenyth Allegra, MD 04/04/22 [phone]

## 2022-04-04 NOTE — ED Triage Notes (Signed)
Pt to ED via [facility] from home. Pt has had 2-3 days of cough, shortness of breath and headache. Pt c/o chest pain, side pain and pain all over.  Pt received duoneb by EMS. Pt has had inhaler, 02 5LNC and nebs at home w/o relief. Pt alert, labored breathing on arrival.

## 2022-04-04 NOTE — H&P (Addendum)
History and Physical    Patient: April [name] OBS:[id number] DOB: 10/07/1970 DOA: 04/04/2022 DOS: the patient was seen and examined on 04/04/2022 PCP: [name], MD  Patient coming from: Home  Chief Complaint:  Chief Complaint  Patient presents with   Shortness of Breath   HPI: [name] is a 51 y.o. female with medical history significant of HTN, anxiety, COPD, GERD, anxiety/depression, hypothyroidism, morbid obesity, OSA, chronic diastolic HF, PAF. Presenting with cough and shortness of breath. Her symptoms started 3 days ago. She had a non-productive cough that progressed into shortness of breath. She didn't try any medications to help. She didn't have any increased edema, orthopnea, N/V/D, sick contacts or fevers. When her symptoms did not improve this morning, she decided to come to the ED for assistance. She denies any other aggravating or alleviating factors.    Review of Systems: As mentioned in the history of present illness. All other systems reviewed and are negative. Past Medical History:  Diagnosis Date   Acanthosis nigricans    Anxiety    Arthritis    "knees" (04/28/2017)   Asthma    Followed by Dr. Annamaria Boots (pulmonology); receives every other week omalizumab injections; has frequent exacerbations   Back pain    Chronic diastolic CHF (congestive heart failure) ([place]) 01/17/2017   COPD (chronic obstructive pulmonary disease) ([place])    PFTs in 2002, FEV1/FVC 65, no post bronchodilater test done   Depression    GERD (gastroesophageal reflux disease)    Headache(784.0)    "q couple days" (47/65/4650)   Helicobacter pylori (H. pylori) infection    Hypertension, essential    Insomnia    Joint pain    Lower extremity edema    Menorrhagia    Morbid obesity ([place])    OSA on CPAP    Sleep study 2008 - mild OSA, not enough events to titrate CPAP; wears CPAP now/pt on 04/28/2017   Pneumonia X 1   Prediabetes    Rheumatoid arthritis (HCC)    Seasonal allergies     Shortness of breath    Tobacco user    Vitamin D deficiency    Past Surgical History:  Procedure Laterality Date   CARDIOVERSION N/A 05/30/2017   Procedure: CARDIOVERSION;  Surgeon: Sanda Klein, MD;  Location: [facility];  Service: Cardiovascular;  Laterality: N/A;   REDUCTION MAMMAPLASTY Bilateral 09/2011   TUBAL LIGATION  1996   bilateral   Social History:  reports that she quit smoking about 7 years ago. Her smoking use included cigarettes. She has a 13.00 pack-year smoking history. She has never used smokeless tobacco. She reports that she does not drink alcohol and does not use drugs.  Allergies  Allergen Reactions   Contrast Media [Iodinated Contrast Media] Itching    CT contrast    Family History  Problem Relation Age of Onset   Hypertension Mother    Asthma Daughter    Cancer Paternal Aunt    Asthma Maternal Grandmother     Prior to Admission medications   Medication Sig Start Date End Date Taking? Authorizing Provider  albuterol (PROAIR HFA) 108 (90 Base) MCG/ACT inhaler Inhale 2 puffs into the lungs every 6 (six) hours as needed for wheezing or shortness of breath. Patient taking differently: Inhale 2 puffs into the lungs 3 (three) times daily as needed for wheezing or shortness of breath. 11/13/21   Baird Lyons D, MD  amLODipine (NORVASC) 10 MG tablet Take 1 tablet (10 mg total) by mouth daily. 03/11/22  [name], MD  apixaban (ELIQUIS) 5 MG TABS tablet Take 1 tablet (5 mg total) by mouth 2 (two) times daily. 11/28/21   [name], MD  carvedilol (COREG) 6.25 MG tablet TAKE 1 TABLET(6.25 MG) BY MOUTH TWICE DAILY WITH A MEAL 03/18/22   [name], MD  clonazePAM (KLONOPIN) 0.5 MG tablet Take 1 tablet (0.5 mg total) by mouth daily as needed for anxiety. Patient taking differently: Take 0.5 mg by mouth 2 (two) times daily as needed for anxiety. 10/18/21   Florencia Reasons, MD  cloNIDine (CATAPRES) 0.2 MG tablet Take 1 tablet (0.2 mg total) by mouth 2 (two)  times daily. 03/11/22   [name], MD  COMBIVENT RESPIMAT 20-100 MCG/ACT AERS respimat Inhale 1 puff into the lungs as needed for wheezing or shortness of breath. 12/28/21   [provider]  diclofenac Sodium (VOLTAREN) 1 % GEL Apply 1 Application topically as needed (pain).    [provider]  DULoxetine (CYMBALTA) 30 MG capsule Take 1 capsule (30 mg total) by mouth daily. 02/05/22   Armando Reichert, MD  EPINEPHrine 0.3 mg/0.3 mL IJ SOAJ injection Inject 0.3 mg into the muscle as needed for anaphylaxis. 05/30/20   Baird Lyons D, MD  FLUoxetine (PROZAC) 10 MG capsule Take 2 capsules (20 mg total) by mouth daily. Take 1 capsule for first 7 days and then start taking 2 capsules daily. 02/05/22   Armando Reichert, MD  fluticasone (FLONASE) 50 MCG/ACT nasal spray SHAKE LIQUID AND USE 1 SPRAY IN EACH NOSTRIL DAILY AS NEEDED FOR ALLERGIES OR RHINITIS Patient taking differently: Place 1 spray into both nostrils daily. 08/05/21   [name], MD  Fluticasone-Umeclidin-Vilant 100-62.5-25 MCG/ACT AEPB Inhale 1 puff into the lungs daily. 10/26/21   Charlynne Cousins, MD  furosemide (LASIX) 20 MG tablet Take 1 tablet (20 mg total) by mouth daily. 11/28/21   [name], MD  ipratropium-albuterol (DUONEB) 0.5-2.5 (3) MG/3ML SOLN 1 neb every 6 hours if needed for asthma 01/28/22   Baird Lyons D, MD  levothyroxine (SYNTHROID) 50 MCG tablet Take 1 tablet (50 mcg total) by mouth daily. 11/28/21   [name], MD  Misc. Devices MISC Bedside commode 01/25/22   [name], MD  Misc. Devices [place] Shower chair Grab bar for the shower Diagnosis:  bilateral knee osteoarthritis 01/30/22   [name], MD  naloxone [facility]) nasal spray 4 mg/0.1 mL Place 1 spray into the nose once as needed (opioid overdose). 08/09/20   [provider]  omeprazole (PRILOSEC) 20 MG capsule Take 1 capsule (20 mg total) by mouth 2 (two) times daily before a meal. 02/01/22   [name], MD   oxyCODONE-acetaminophen (PERCOCET) 10-325 MG tablet Take 1 tablet by mouth 5 (five) times daily as needed (nerve pain). 08/01/21   [provider]  oxyCODONE-acetaminophen (PERCOCET) 10-325 MG tablet Take 1 tablet by mouth 5 (five) times daily as needed for pain. 03/26/22     phentermine 15 MG capsule Take 15 mg by mouth daily. 08/10/21   [provider]  potassium chloride SA (KLOR-CON M) 20 MEQ tablet TAKE 1 TABLET(20 MEQ) BY MOUTH DAILY 02/01/22   [name], MD  predniSONE (DELTASONE) 10 MG tablet 1 daily if needed 01/28/22   Baird Lyons D, MD  pregabalin (LYRICA) 300 MG capsule Take 300 mg by mouth in the morning, at noon, and at bedtime. 01/08/21   [provider]  Semaglutide, 1 MG/DOSE, (OZEMPIC, 1 MG/DOSE,) 4 MG/3ML SOPN Inject 1 mg into the skin  once a week.    [provider]  Tezepelumab-ekko (TEZSPIRE) 210 MG/1.91ML SOAJ Inject 210 mg into the skin every 28 (twenty-eight) days. 11/05/21   Baird Lyons D, MD  topiramate (TOPAMAX) 200 MG tablet Take 200 mg by mouth at bedtime. 01/08/21   [provider]  Vitamin D, Ergocalciferol, (DRISDOL) 1.25 MG (50000 UNIT) CAPS capsule Take 50,000 Units by mouth once a week. 11/30/21   [provider]  zolpidem (AMBIEN) 10 MG tablet TAKE 1 TABLET BY MOUTH AT BEDTIME AS NEEDED FOR SLEEP 01/28/22   Young, Clinton D, MD  mometasone-formoterol [facility]) 200-5 MCG/ACT AERO Inhale 2 puffs into the lungs 2 (two) times daily. Dx: Asthma 04/08/19 08/12/19  Baird Lyons D, MD    Physical Exam: Vitals:   04/04/22 0600 04/04/22 0615 04/04/22 0630 04/04/22 0721  BP:    (!) 165/83  Pulse: 88 93 94 86  Resp: 20 (!) 28 (!) 26 20  Temp:    (!) 97.3 F (36.3 C)  TempSrc:    Oral  SpO2: 100% 100% 100% 100%  Weight:      Height:       General: 51 y.o. female resting in bed in NAD Eyes: PERRL, normal sclera ENMT: Nares patent w/o discharge, orophaynx clear, dentition normal, ears w/o  discharge/lesions/ulcers Neck: Supple, trachea midline Cardiovascular: RRR, +S1, S2, no m/g/r, equal pulses throughout Respiratory: diffuse expiratory wheeze, slightly increased WOB on 5L GI: BS+, NDNT, no masses noted, no organomegaly noted MSK: No e/c/c Neuro: A&O x 3, no focal deficits Psyc: Appropriate interaction and affect, calm/cooperative  Data Reviewed:  Results for orders placed or performed during the hospital encounter of 04/04/22 (from the past 24 hour(s))  Resp Panel by RT-PCR (Flu A&B, Covid) Anterior Nasal Swab     Status: None   Collection Time: 04/04/22  5:35 AM   Specimen: Anterior Nasal Swab  Result Value Ref Range   SARS Coronavirus 2 by RT PCR NEGATIVE NEGATIVE   Influenza A by PCR NEGATIVE NEGATIVE   Influenza B by PCR NEGATIVE NEGATIVE  CBC with Differential/Platelet     Status: Abnormal   Collection Time: 04/04/22  5:35 AM  Result Value Ref Range   WBC 19.3 (H) 4.0 - 10.5 K/uL   RBC 4.52 3.87 - 5.11 MIL/uL   Hemoglobin 11.4 (L) 12.0 - 15.0 g/dL   HCT 37.3 36.0 - 46.0 %   MCV 82.5 80.0 - 100.0 fL   MCH 25.2 (L) 26.0 - 34.0 pg   MCHC 30.6 30.0 - 36.0 g/dL   RDW 18.9 (H) 11.5 - 15.5 %   Platelets 362 150 - 400 K/uL   nRBC 0.0 0.0 - 0.2 %   Neutrophils Relative % 52 %   Neutro Abs 10.1 (H) 1.7 - 7.7 K/uL   Lymphocytes Relative 38 %   Lymphs Abs 7.3 (H) 0.7 - 4.0 K/uL   Monocytes Relative 8 %   Monocytes Absolute 1.6 (H) 0.1 - 1.0 K/uL   Eosinophils Relative 1 %   Eosinophils Absolute 0.1 0.0 - 0.5 K/uL   Basophils Relative 1 %   Basophils Absolute 0.1 0.0 - 0.1 K/uL   Immature Granulocytes 0 %   Abs Immature Granulocytes 0.08 (H) 0.00 - 0.07 K/uL   Reactive, Benign Lymphocytes PRESENT   Basic metabolic panel     Status: Abnormal   Collection Time: 04/04/22  5:35 AM  Result Value Ref Range   Sodium 141 135 - 145 mmol/L   Potassium 2.9 (L)  3.5 - 5.1 mmol/L   Chloride 110 98 - 111 mmol/L   CO2 21 (L) 22 - 32 mmol/L   Glucose, Bld 92 70 - 99 mg/dL    BUN 11 6 - 20 mg/dL   Creatinine, Ser 0.94 0.44 - 1.00 mg/dL   Calcium 8.4 (L) 8.9 - 10.3 mg/dL   GFR, Estimated >60 >60 mL/min   Anion gap 10 5 - 15  Troponin I (High Sensitivity)     Status: None   Collection Time: 04/04/22  5:35 AM  Result Value Ref Range   Troponin I (High Sensitivity) 3 <18 ng/L  Brain natriuretic peptide     Status: None   Collection Time: 04/04/22  5:35 AM  Result Value Ref Range   B Natriuretic Peptide 95.5 0.0 - 100.0 pg/mL   *Note: Due to a large number of results and/or encounters for the requested time period, some results have not been displayed. A complete set of results can be found in Results Review.   CXR: No active disease.   Assessment and Plan: COPD exacerbation     - placed in obs, progressive     - continue steroids, nebs     - COVID/flu negative, check RVP     - CXR is negative  Hypokalemia     - replace K+; check BW6+  Chronic diastolic HF     - continue home regimen when confirmed  PAF     - continue home regimen when confirmed  HTN     - continue home regimen when confirmed  Anxiety Depression     - continue home regimen when confirmed  GERD     - continue home regimen when confirmed  Hypothyroidism     - continue home regimen when confirmed  OSA     - CPAP qHS  Morbid obesity     - continue outpt follow up  Advance Care Planning:   Code Status: FULL  Consults: None  Family Communication: None at bedside.   Severity of Illness: The appropriate patient status for this patient is OBSERVATION. Observation status is judged to be reasonable and necessary in order to provide the required intensity of service to ensure the patient's safety. The patient's presenting symptoms, physical exam findings, and initial radiographic and laboratory data in the context of their medical condition is felt to place them at decreased risk for further clinical deterioration. Furthermore, it is anticipated that the patient will be  medically stable for discharge from the hospital within 2 midnights of admission.   Author: Jonnie Finner, DO 04/04/2022 8:59 AM  For on call review [email or web address].

## 2022-04-05 ENCOUNTER — Ambulatory Visit: Payer: Medicare Other

## 2022-04-05 DIAGNOSIS — J4551 Severe persistent asthma with (acute) exacerbation: Secondary | ICD-10-CM

## 2022-04-05 DIAGNOSIS — F32A Depression, unspecified: Secondary | ICD-10-CM | POA: Diagnosis present

## 2022-04-05 DIAGNOSIS — M069 Rheumatoid arthritis, unspecified: Secondary | ICD-10-CM | POA: Diagnosis present

## 2022-04-05 DIAGNOSIS — I480 Paroxysmal atrial fibrillation: Secondary | ICD-10-CM | POA: Diagnosis not present

## 2022-04-05 DIAGNOSIS — G4733 Obstructive sleep apnea (adult) (pediatric): Secondary | ICD-10-CM | POA: Diagnosis present

## 2022-04-05 DIAGNOSIS — G894 Chronic pain syndrome: Secondary | ICD-10-CM | POA: Diagnosis present

## 2022-04-05 DIAGNOSIS — Z8249 Family history of ischemic heart disease and other diseases of the circulatory system: Secondary | ICD-10-CM | POA: Diagnosis not present

## 2022-04-05 DIAGNOSIS — I5032 Chronic diastolic (congestive) heart failure: Secondary | ICD-10-CM | POA: Diagnosis present

## 2022-04-05 DIAGNOSIS — E039 Hypothyroidism, unspecified: Secondary | ICD-10-CM | POA: Diagnosis present

## 2022-04-05 DIAGNOSIS — Z809 Family history of malignant neoplasm, unspecified: Secondary | ICD-10-CM | POA: Diagnosis not present

## 2022-04-05 DIAGNOSIS — I110 Hypertensive heart disease with heart failure: Secondary | ICD-10-CM | POA: Diagnosis present

## 2022-04-05 DIAGNOSIS — Z79899 Other long term (current) drug therapy: Secondary | ICD-10-CM | POA: Diagnosis not present

## 2022-04-05 DIAGNOSIS — J4489 Other specified chronic obstructive pulmonary disease: Secondary | ICD-10-CM | POA: Diagnosis present

## 2022-04-05 DIAGNOSIS — Z6841 Body Mass Index (BMI) 40.0 and over, adult: Secondary | ICD-10-CM | POA: Diagnosis not present

## 2022-04-05 DIAGNOSIS — E876 Hypokalemia: Secondary | ICD-10-CM | POA: Diagnosis present

## 2022-04-05 DIAGNOSIS — J441 Chronic obstructive pulmonary disease with (acute) exacerbation: Secondary | ICD-10-CM | POA: Diagnosis present

## 2022-04-05 DIAGNOSIS — Z1152 Encounter for screening for COVID-19: Secondary | ICD-10-CM | POA: Diagnosis not present

## 2022-04-05 DIAGNOSIS — Z825 Family history of asthma and other chronic lower respiratory diseases: Secondary | ICD-10-CM | POA: Diagnosis not present

## 2022-04-05 DIAGNOSIS — Z7985 Long-term (current) use of injectable non-insulin antidiabetic drugs: Secondary | ICD-10-CM | POA: Diagnosis not present

## 2022-04-05 DIAGNOSIS — F419 Anxiety disorder, unspecified: Secondary | ICD-10-CM | POA: Diagnosis present

## 2022-04-05 DIAGNOSIS — Z7951 Long term (current) use of inhaled steroids: Secondary | ICD-10-CM | POA: Diagnosis not present

## 2022-04-05 DIAGNOSIS — M170 Bilateral primary osteoarthritis of knee: Secondary | ICD-10-CM | POA: Diagnosis present

## 2022-04-05 DIAGNOSIS — I10 Essential (primary) hypertension: Secondary | ICD-10-CM | POA: Diagnosis not present

## 2022-04-05 DIAGNOSIS — Z9981 Dependence on supplemental oxygen: Secondary | ICD-10-CM | POA: Diagnosis not present

## 2022-04-05 DIAGNOSIS — K219 Gastro-esophageal reflux disease without esophagitis: Secondary | ICD-10-CM | POA: Diagnosis present

## 2022-04-05 LAB — CBC
HCT: 38.4 % (ref 36.0–46.0)
Hemoglobin: 11.5 g/dL — ABNORMAL LOW (ref 12.0–15.0)
MCH: 25.3 pg — ABNORMAL LOW (ref 26.0–34.0)
MCHC: 29.9 g/dL — ABNORMAL LOW (ref 30.0–36.0)
MCV: 84.4 fL (ref 80.0–100.0)
Platelets: 331 10*3/uL (ref 150–400)
RBC: 4.55 MIL/uL (ref 3.87–5.11)
RDW: 18.9 % — ABNORMAL HIGH (ref 11.5–15.5)
WBC: 16.9 10*3/uL — ABNORMAL HIGH (ref 4.0–10.5)
nRBC: 0 % (ref 0.0–0.2)

## 2022-04-05 LAB — COMPREHENSIVE METABOLIC PANEL
ALT: 14 U/L (ref 0–44)
AST: 19 U/L (ref 15–41)
Albumin: 3.4 g/dL — ABNORMAL LOW (ref 3.5–5.0)
Alkaline Phosphatase: 64 U/L (ref 38–126)
Anion gap: 10 (ref 5–15)
BUN: 12 mg/dL (ref 6–20)
CO2: 16 mmol/L — ABNORMAL LOW (ref 22–32)
Calcium: 8.4 mg/dL — ABNORMAL LOW (ref 8.9–10.3)
Chloride: 109 mmol/L (ref 98–111)
Creatinine, Ser: 0.88 mg/dL (ref 0.44–1.00)
GFR, Estimated: >60 mL/min (ref >60)
Glucose, Bld: 93 mg/dL (ref 70–99)
Potassium: 4 mmol/L (ref 3.5–5.1)
Sodium: 135 mmol/L (ref 135–145)
Total Bilirubin: 0.4 mg/dL (ref 0.3–1.2)
Total Protein: 7.2 g/dL (ref 6.5–8.1)

## 2022-04-05 MED ORDER — IPRATROPIUM-ALBUTEROL 0.5-2.5 (3) MG/3ML IN SOLN: 3.0000 mL | Freq: Four times a day (QID) | RESPIRATORY_TRACT | Status: DC | PRN

## 2022-04-05 MED ORDER — CARVEDILOL 3.125 MG PO TABS
3.1250 mg | ORAL_TABLET | Freq: Two times a day (BID) | ORAL | Status: DC
  Administered 2022-04-05 – 2022-04-07 (×4): 3.125 mg via ORAL
  Filled 2022-04-05 (×4): qty 1

## 2022-04-05 MED ORDER — PREGABALIN 75 MG PO CAPS
300.0000 mg | ORAL_CAPSULE | Freq: Two times a day (BID) | ORAL | Status: DC
  Administered 2022-04-05 – 2022-04-07 (×5): 300 mg via ORAL
  Filled 2022-04-05 (×5): qty 4

## 2022-04-05 MED ORDER — TOPIRAMATE 100 MG PO TABS
200.0000 mg | ORAL_TABLET | Freq: Every day | ORAL | Status: DC
  Administered 2022-04-05 – 2022-04-06 (×2): 200 mg via ORAL
  Filled 2022-04-05 (×2): qty 2

## 2022-04-05 MED ORDER — ALBUTEROL SULFATE (2.5 MG/3ML) 0.083% IN NEBU
2.5000 mg | INHALATION_SOLUTION | Freq: Four times a day (QID) | RESPIRATORY_TRACT | Status: DC | PRN
  Administered 2022-04-05: 2.5 mg via RESPIRATORY_TRACT
  Filled 2022-04-05: qty 3

## 2022-04-05 MED ORDER — FLUOXETINE HCL 10 MG PO CAPS
10.0000 mg | ORAL_CAPSULE | Freq: Every day | ORAL | Status: DC
  Administered 2022-04-05 – 2022-04-07 (×3): 10 mg via ORAL
  Filled 2022-04-05 (×3): qty 1

## 2022-04-05 MED ORDER — BUDESONIDE 0.25 MG/2ML IN SUSP
0.2500 mg | Freq: Two times a day (BID) | RESPIRATORY_TRACT | Status: DC
  Administered 2022-04-05 – 2022-04-07 (×5): 0.25 mg via RESPIRATORY_TRACT
  Filled 2022-04-05 (×5): qty 2

## 2022-04-05 MED ORDER — LEVOTHYROXINE SODIUM 50 MCG PO TABS
50.0000 ug | ORAL_TABLET | Freq: Every day | ORAL | Status: DC
  Administered 2022-04-06 – 2022-04-07 (×2): 50 ug via ORAL
  Filled 2022-04-05 (×2): qty 1

## 2022-04-05 MED ORDER — APIXABAN 5 MG PO TABS
5.0000 mg | ORAL_TABLET | Freq: Two times a day (BID) | ORAL | Status: DC
  Administered 2022-04-05 – 2022-04-07 (×5): 5 mg via ORAL
  Filled 2022-04-05 (×5): qty 1

## 2022-04-05 MED ORDER — METHYLPREDNISOLONE SODIUM SUCC 40 MG IJ SOLR
40.0000 mg | Freq: Two times a day (BID) | INTRAMUSCULAR | Status: AC
  Administered 2022-04-05: 40 mg via INTRAVENOUS
  Filled 2022-04-05: qty 1

## 2022-04-05 MED ORDER — CLONAZEPAM 0.5 MG PO TABS
0.5000 mg | ORAL_TABLET | Freq: Two times a day (BID) | ORAL | Status: DC | PRN
  Administered 2022-04-05 – 2022-04-06 (×2): 0.5 mg via ORAL
  Filled 2022-04-05 (×2): qty 1

## 2022-04-05 MED ORDER — DULOXETINE HCL 60 MG PO CPEP
60.0000 mg | ORAL_CAPSULE | Freq: Every day | ORAL | Status: DC
  Administered 2022-04-05 – 2022-04-07 (×3): 60 mg via ORAL
  Filled 2022-04-05 (×3): qty 1

## 2022-04-05 MED ORDER — AMLODIPINE BESYLATE 10 MG PO TABS
10.0000 mg | ORAL_TABLET | Freq: Every day | ORAL | Status: DC
  Administered 2022-04-05 – 2022-04-07 (×3): 10 mg via ORAL
  Filled 2022-04-05 (×3): qty 1

## 2022-04-05 MED ORDER — ARFORMOTEROL TARTRATE 15 MCG/2ML IN NEBU
15.0000 ug | INHALATION_SOLUTION | Freq: Two times a day (BID) | RESPIRATORY_TRACT | Status: DC
  Administered 2022-04-05 – 2022-04-07 (×5): 15 ug via RESPIRATORY_TRACT
  Filled 2022-04-05 (×6): qty 2

## 2022-04-05 MED ORDER — IPRATROPIUM-ALBUTEROL 0.5-2.5 (3) MG/3ML IN SOLN
3.0000 mL | Freq: Four times a day (QID) | RESPIRATORY_TRACT | Status: DC
  Administered 2022-04-05 – 2022-04-07 (×9): 3 mL via RESPIRATORY_TRACT
  Filled 2022-04-05 (×9): qty 3

## 2022-04-05 MED ORDER — PREDNISONE 20 MG PO TABS
40.0000 mg | ORAL_TABLET | Freq: Every day | ORAL | Status: DC
  Administered 2022-04-06 – 2022-04-07 (×2): 40 mg via ORAL
  Filled 2022-04-05 (×2): qty 2

## 2022-04-05 NOTE — Hospital Course (Addendum)
April [name] is a 51 yo female with PMH asthma, anxiety, chronic pain, COPD, depression, GERD, HTN, morbid obesity, OSA on CPAP, prediabetes, RA who presented with shortness of breath, wheezing, nonproductive cough.  Symptoms began a few days prior to admission and were not improving therefore she presented for further evaluation.  She does live with a current smoker and endorses having worsening breathing due to this. CXR obtained on admission was negative for underlying infiltrates or edema.  She was admitted for asthma exacerbation and started on steroids and breathing treatments.

## 2022-04-05 NOTE — Assessment & Plan Note (Deleted)
-   Remains too dyspneic for discharging home still

## 2022-04-05 NOTE — Assessment & Plan Note (Signed)
-   Database reviewed - Continue home Percocet

## 2022-04-05 NOTE — Assessment & Plan Note (Signed)
Continue Synthroid °

## 2022-04-05 NOTE — Assessment & Plan Note (Signed)
-   Complicates overall prognosis and care - Body mass index is 51.42 kg/m.

## 2022-04-05 NOTE — Assessment & Plan Note (Signed)
-   Continue Coreg and Eliquis

## 2022-04-05 NOTE — Assessment & Plan Note (Signed)
-   Resume amlodipine, Coreg

## 2022-04-05 NOTE — Assessment & Plan Note (Signed)
-   Remains too dyspneic for discharging home still.  Continues to have ongoing wheezing and cough.  Remains on oxygen; states that she is only on nocturnal oxygen at home - Continue steroids and nebulizers - COVID, flu, RVP negative

## 2022-04-05 NOTE — Assessment & Plan Note (Signed)
-   Continue clonazepam - Continue Prozac and duloxetine

## 2022-04-05 NOTE — TOC Initial Note (Signed)
Transition of Care [facility]) - Initial/Assessment Note    Patient Details  Name: April [name] MRN: [id number] Date of Birth: 26-Dec-1970  Transition of Care [facility]) CM/SW Contact:    Leeroy Cha, RN Phone Number: 04/05/2022, 8:32 AM  Clinical Narrative:                  Transition of Care [facility]) Screening Note   Patient Details  Name: April [name] Date of Birth: 12-May-1970   Transition of Care [facility]) CM/SW Contact:    Leeroy Cha, RN Phone Number: 04/05/2022, 8:32 AM    Transition of Care Department [facility]) has reviewed patient and no TOC needs have been identified at this time. We will continue to monitor patient advancement through interdisciplinary progression rounds. If new patient transition needs arise, please place a TOC consult.    Expected Discharge Plan: Home/Self Care Barriers to Discharge: Continued Medical Work up   Patient Goals and CMS Choice Patient states their goals for this hospitalization and ongoing recovery are:: to return home CMS Medicare.gov Compare Post Acute Care list provided to:: Legal Guardian    Expected Discharge Plan and Services Expected Discharge Plan: Home/Self Care   Discharge Planning Services: CM Consult   Living arrangements for the past 2 months: Apartment                                      Prior Living Arrangements/Services Living arrangements for the past 2 months: Apartment Lives with:: Self Patient language and need for interpreter reviewed:: Yes Do you feel safe going back to the place where you live?: Yes            Criminal Activity/Legal Involvement Pertinent to Current Situation/Hospitalization: No - Comment as needed  Activities of Daily Living Home Assistive Devices/Equipment: Bedside commode/3-in-1, Built-in shower seat, Cane (specify quad or straight), Dentures (specify type), Oxygen, Scales, Shower chair with back, Walker (specify type), Wheelchair ADL Screening (condition at time of  admission) Patient's cognitive ability adequate to safely complete daily activities?: No Is the patient deaf or have difficulty hearing?: No Does the patient have difficulty seeing, even when wearing glasses/contacts?: No Does the patient have difficulty concentrating, remembering, or making decisions?: Yes Patient able to express need for assistance with ADLs?: Yes Does the patient have difficulty dressing or bathing?: No Independently performs ADLs?: Yes (appropriate for developmental age) Does the patient have difficulty walking or climbing stairs?: Yes Weakness of Legs: Both Weakness of Arms/Hands: None  Permission Sought/Granted                  Emotional Assessment Appearance:: Appears stated age Attitude/Demeanor/Rapport: Engaged Affect (typically observed): Calm Orientation: : Oriented to Self, Oriented to Place, Oriented to  Time, Oriented to Situation Alcohol / Substance Use: Tobacco Use (quit 7 syearas ago) Psych Involvement: No (comment)  Admission diagnosis:  COPD exacerbation ([place]) [J44.1] COPD with acute exacerbation ([place]) [J44.1] Patient Active Problem List   Diagnosis Date Noted   COPD with acute exacerbation ([place]) 04/04/2022   Chronic pain syndrome    Severe persistent asthma with acute exacerbation in adult 08/23/2021   Hypothyroidism 08/23/2021   Chronic heart failure with preserved ejection fraction (HFpEF) ([place]) 08/23/2021   Primary osteoarthritis of right knee 07/04/2021   Body mass index 50.0-59.9, adult ([place]) 07/04/2021   Painful orthopaedic hardware ([place]) 07/04/2021   COPD with exacerbation ([place]) 02/04/2021   Severe  persistent asthma 07/04/2020   Acute respiratory failure ([place]) 02/20/2019   CHF exacerbation ([place]) 02/20/2019   Hot flashes    Asthma-COPD overlap syndrome 10/26/2018   Acute respiratory distress 08/03/2018   Acute exacerbation of chronic obstructive pulmonary disease (COPD) ([place]) 08/03/2018   Vitamin D deficiency 05/26/2018    Acute asthma exacerbation 05/16/2018   Urinary incontinence 11/17/2017   Nausea with vomiting 11/06/2017   Hypomagnesemia 10/27/2017   Dysphagia 10/27/2017   Physical deconditioning 10/25/2017   Acute maxillary sinusitis 10/24/2017   Emesis, persistent 10/08/2017   Volume depletion 10/08/2017   Chest pain 10/08/2017   SOB (shortness of breath) 10/08/2017   Debility 09/22/2017   AKI (acute kidney injury) ([place]) 08/18/2017   HCAP (healthcare-associated pneumonia) 08/06/2017   Chronic atrial fibrillation 07/29/2017   Acute respiratory failure with hypoxia (HCC) 07/07/2017   AF (paroxysmal atrial fibrillation) (HCC)    Dyspnea 02/04/2017   Asthma exacerbation 65/68/1275   Metabolic syndrome 17/00/1749   Moderate persistent asthma with exacerbation 10/19/2016   Normocytic anemia 10/02/2016   Elevated hemoglobin A1c 05/05/2016   GERD (gastroesophageal reflux disease) 08/30/2015   Anxiety and depression 08/30/2015   Generalized anxiety disorder 08/30/2015   Hyperlipidemia 12/08/2013   Seasonal allergic rhinitis 08/29/2013   Leukocytosis 10/21/2012   Tobacco abuse in remission 10/07/2012   COPD mixed type ([place]) 05/07/2012   Knee pain, bilateral 04/25/2011   Insomnia 03/14/2011   Obstructive sleep apnea 12/19/2010   Hypokalemia 08/13/2010   Cervical back pain with evidence of disc disease 04/08/2008   HTN (hypertension) 07/31/2006   Morbid obesity due to excess calories ([place]) 06/17/2006   Major depressive disorder, recurrent episode ([place]) 04/10/2006   PCP:  [name], MD Pharmacy:   [facility] [place], [address] [place] [address] Phone: [phone] Fax: [place] [address] Phone: [phone] Fax: [phone]     Social Determinants of Health (SDOH) Interventions    Readmission Risk  Interventions   Row Labels 01/10/2022   11:28 AM 10/24/2021   12:29 PM 10/16/2021   11:39 AM  Readmission Risk Prevention Plan   Section Header. No data exists in this row.     Transportation Screening   Complete Complete Complete  Medication Review Press [employer])   Complete Complete Complete  PCP or Specialist appointment within 3-5 days of discharge   Complete Complete Complete  HRI or [place]   Complete Complete Complete  SW Recovery Care/Counseling Consult   Complete Complete   Palliative Care Screening   Not Applicable Not [place]   Not Applicable Not Applicable

## 2022-04-05 NOTE — Progress Notes (Signed)
PT refused CPAP.  

## 2022-04-05 NOTE — Assessment & Plan Note (Signed)
-   no s/s exac - hold lasix

## 2022-04-05 NOTE — Assessment & Plan Note (Signed)
-   Replete as needed 

## 2022-04-05 NOTE — Progress Notes (Signed)
Progress Note    April [name]   [id number]:[id number]  DOB: 1970-05-01  DOA: 04/04/2022     0 PCP: [name], MD  Initial CC: Shortness of breath, cough  Hospital Course: Ms. Brockel is a 51 yo female with PMH asthma, anxiety, chronic pain, COPD, depression, GERD, HTN, morbid obesity, OSA on CPAP, prediabetes, RA who presented with shortness of breath, wheezing, nonproductive cough.  Symptoms began a few days prior to admission and were not improving therefore she presented for further evaluation.  She does live with a current smoker and endorses having worsening breathing due to this. CXR obtained on admission was negative for underlying infiltrates or edema.  She was admitted for asthma exacerbation and started on steroids and breathing treatments.  Interval History:  Patient admitted yesterday.  This morning she is lying in bed still rather dyspneic and short of breath.  Ongoing coughing and wheezing.  Still unsafe for discharging home at this time given ongoing symptom burden.  She is on nocturnal oxygen and remains on continuous oxygen currently as well.  Assessment and Plan: * Severe persistent asthma with acute exacerbation in adult - Remains too dyspneic for discharging home still.  Continues to have ongoing wheezing and cough.  Remains on oxygen; states that she is only on nocturnal oxygen at home - Continue steroids and nebulizers - COVID, flu, RVP negative  HTN (hypertension) - Resume amlodipine, Coreg  AF (paroxysmal atrial fibrillation) (HCC) - Continue Coreg and Eliquis  Chronic heart failure with preserved ejection fraction (HFpEF) (HCC) - no s/s exac - hold lasix  Hypothyroidism - Continue Synthroid  Chronic pain syndrome - Database reviewed - Continue home Percocet  Anxiety and depression - Continue clonazepam - Continue Prozac and duloxetine  Hypokalemia - Replete as needed  Morbid obesity due to excess calories ([place]) - Complicates overall prognosis and  care - Body mass index is 51.42 kg/m.   Old records reviewed in assessment of this patient  Antimicrobials:   DVT prophylaxis:   apixaban (ELIQUIS) tablet 5 mg   Code Status:   Code Status: Full Code  Mobility Assessment (last 72 hours)     Mobility Assessment     Row Name 04/04/22 2313 04/04/22 12:46:31         Does patient have an order for bedrest or is patient medically unstable No - Continue assessment No - Continue assessment      What is the highest level of mobility based on the progressive mobility assessment? Level 5 (Walks with assist in room/hall) - Balance while stepping forward/back and can walk in room with assist - Complete Level 5 (Walks with assist in room/hall) - Balance while stepping forward/back and can walk in room with assist - Complete               Barriers to discharge:  Disposition Plan:  Home 1-2 days Status is: Inpt  Objective: Blood pressure (!) 154/94, pulse 78, temperature 98 F (36.7 C), temperature source Oral, resp. rate (!) 22, height '5\' 6"'$  (1.676 m), weight (!) 144.5 kg, last menstrual period 03/14/2022, SpO2 100 %.  Examination:  Physical Exam Constitutional:      Appearance: Normal appearance. She is obese.  HENT:     Head: Normocephalic and atraumatic.     Mouth/Throat:     Mouth: Mucous membranes are moist.  Eyes:     Extraocular Movements: Extraocular movements intact.  Cardiovascular:     Rate and Rhythm: Normal rate and regular rhythm.  Pulmonary:     Effort: Pulmonary effort is normal.     Breath sounds: Wheezing present.  Abdominal:     General: Bowel sounds are normal. There is no distension.     Palpations: Abdomen is soft.     Tenderness: There is no abdominal tenderness.  Musculoskeletal:        General: Normal range of motion.     Cervical back: Normal range of motion and neck supple.  Skin:    General: Skin is warm.  Neurological:     General: No focal deficit present.     Mental Status: She is  alert.  Psychiatric:        Mood and Affect: Mood normal.      Consultants:    Procedures:    Data Reviewed: Results for orders placed or performed during the hospital encounter of 04/04/22 (from the past 24 hour(s))  Comprehensive metabolic panel     Status: Abnormal   Collection Time: 04/05/22  4:09 AM  Result Value Ref Range   Sodium 135 135 - 145 mmol/L   Potassium 4.0 3.5 - 5.1 mmol/L   Chloride 109 98 - 111 mmol/L   CO2 16 (L) 22 - 32 mmol/L   Glucose, Bld 93 70 - 99 mg/dL   BUN 12 6 - 20 mg/dL   Creatinine, Ser 0.88 0.44 - 1.00 mg/dL   Calcium 8.4 (L) 8.9 - 10.3 mg/dL   Total Protein 7.2 6.5 - 8.1 g/dL   Albumin 3.4 (L) 3.5 - 5.0 g/dL   AST 19 15 - 41 U/L   ALT 14 0 - 44 U/L   Alkaline Phosphatase 64 38 - 126 U/L   Total Bilirubin 0.4 0.3 - 1.2 mg/dL   GFR, Estimated >60 >60 mL/min   Anion gap 10 5 - 15  CBC     Status: Abnormal   Collection Time: 04/05/22  4:09 AM  Result Value Ref Range   WBC 16.9 (H) 4.0 - 10.5 K/uL   RBC 4.55 3.87 - 5.11 MIL/uL   Hemoglobin 11.5 (L) 12.0 - 15.0 g/dL   HCT 38.4 36.0 - 46.0 %   MCV 84.4 80.0 - 100.0 fL   MCH 25.3 (L) 26.0 - 34.0 pg   MCHC 29.9 (L) 30.0 - 36.0 g/dL   RDW 18.9 (H) 11.5 - 15.5 %   Platelets 331 150 - 400 K/uL   nRBC 0.0 0.0 - 0.2 %   *Note: Due to a large number of results and/or encounters for the requested time period, some results have not been displayed. A complete set of results can be found in Results Review.    I have Reviewed nursing notes, Vitals, and Lab results since pt's last encounter. Pertinent lab results : see above I have ordered test including BMP, CBC, Mg I have reviewed the last note from staff over past 24 hours I have discussed pt's care plan and test results with nursing staff, case manager  Time spent: Greater than 50% of the 55 minute visit was spent in counseling/coordination of care for the patient as laid out in the A&P.    LOS: 0 days   Dwyane Dee, MD [facility] 04/05/2022, 2:32 PM

## 2022-04-05 NOTE — Progress Notes (Signed)
Patient refuses CPAP 

## 2022-04-06 DIAGNOSIS — J4551 Severe persistent asthma with (acute) exacerbation: Secondary | ICD-10-CM | POA: Diagnosis not present

## 2022-04-06 DIAGNOSIS — I10 Essential (primary) hypertension: Secondary | ICD-10-CM

## 2022-04-06 LAB — CBC WITH DIFFERENTIAL/PLATELET
Abs Immature Granulocytes: 0.16 10*3/uL — ABNORMAL HIGH (ref 0.00–0.07)
Basophils Absolute: 0.1 10*3/uL (ref 0.0–0.1)
Basophils Relative: 0 %
Eosinophils Absolute: 0 10*3/uL (ref 0.0–0.5)
Eosinophils Relative: 0 %
HCT: 37.1 % (ref 36.0–46.0)
Hemoglobin: 11.5 g/dL — ABNORMAL LOW (ref 12.0–15.0)
Immature Granulocytes: 1 %
Lymphocytes Relative: 34 %
Lymphs Abs: 5.7 10*3/uL — ABNORMAL HIGH (ref 0.7–4.0)
MCH: 25.8 pg — ABNORMAL LOW (ref 26.0–34.0)
MCHC: 31 g/dL (ref 30.0–36.0)
MCV: 83.2 fL (ref 80.0–100.0)
Monocytes Absolute: 1.9 10*3/uL — ABNORMAL HIGH (ref 0.1–1.0)
Monocytes Relative: 11 %
Neutro Abs: 9 10*3/uL — ABNORMAL HIGH (ref 1.7–7.7)
Neutrophils Relative %: 54 %
Platelet Morphology: NORMAL
Platelets: 384 10*3/uL (ref 150–400)
RBC: 4.46 MIL/uL (ref 3.87–5.11)
RDW: 18.7 % — ABNORMAL HIGH (ref 11.5–15.5)
WBC: 16.8 10*3/uL — ABNORMAL HIGH (ref 4.0–10.5)
nRBC: 0 % (ref 0.0–0.2)

## 2022-04-06 LAB — BASIC METABOLIC PANEL
Anion gap: 6 (ref 5–15)
BUN: 17 mg/dL (ref 6–20)
CO2: 22 mmol/L (ref 22–32)
Calcium: 8.6 mg/dL — ABNORMAL LOW (ref 8.9–10.3)
Chloride: 109 mmol/L (ref 98–111)
Creatinine, Ser: 0.82 mg/dL (ref 0.44–1.00)
GFR, Estimated: >60 mL/min (ref >60)
Glucose, Bld: 82 mg/dL (ref 70–99)
Potassium: 3.9 mmol/L (ref 3.5–5.1)
Sodium: 137 mmol/L (ref 135–145)

## 2022-04-06 LAB — MAGNESIUM: Magnesium: 2.1 mg/dL (ref 1.7–2.4)

## 2022-04-06 MED ORDER — GUAIFENESIN-DM 100-10 MG/5ML PO SYRP
5.0000 mL | ORAL_SOLUTION | ORAL | Status: DC | PRN
  Administered 2022-04-06: 5 mL via ORAL
  Filled 2022-04-06: qty 10

## 2022-04-06 MED ORDER — SALINE SPRAY 0.65 % NA SOLN
1.0000 | NASAL | Status: DC | PRN
  Administered 2022-04-06: 1 via NASAL
  Filled 2022-04-06: qty 44

## 2022-04-06 MED ORDER — PREDNISONE 20 MG PO TABS: 40.0000 mg | ORAL_TABLET | Freq: Every day | ORAL | 0 refills | Status: DC

## 2022-04-06 NOTE — Progress Notes (Signed)
SATURATION QUALIFICATIONS: (This note is used to comply with regulatory documentation for home oxygen)  Patient Saturations on Room Air at Rest = 97%  Patient Saturations on Room Air while Ambulating = 87%  Patient Saturations on 4 Liters of oxygen while Ambulating = 97%  Please briefly explain why patient needs home oxygen: pt c/o of SOB of breath while ambulating on room air.

## 2022-04-06 NOTE — Progress Notes (Signed)
Progress Note    April [name]   [id number]  DOB: 1970/05/25  DOA: 04/04/2022     1 PCP: April Rakes, MD  Initial CC: Shortness of breath, cough  Hospital Course: April [name] is a 51 yo female with PMH asthma, anxiety, chronic pain, COPD, depression, GERD, HTN, morbid obesity, OSA on CPAP, prediabetes, RA who presented with shortness of breath, wheezing, nonproductive cough.  Symptoms began a few days prior to admission and were not improving therefore she presented for further evaluation.  She does live with a current smoker and endorses having worsening breathing due to this. CXR obtained on admission was negative for underlying infiltrates or edema.  She was admitted for asthma exacerbation and started on steroids and breathing treatments.  Interval History:  Tried discussing discharge today and arranging for portable oxygen for her to get home however she endorsed not feeling well enough and threatened that she would come right back to the ER if discharged.  Assessment and Plan: * Severe persistent asthma with acute exacerbation in adult - Remains too dyspneic for discharging home still.  Continues to have ongoing wheezing and cough.  Remains on oxygen; states that she is only on nocturnal oxygen at home - Continue steroids and nebulizers - COVID, flu, RVP negative  HTN (hypertension) - Resume amlodipine, Coreg  AF (paroxysmal atrial fibrillation) (HCC) - Continue Coreg and Eliquis  Chronic heart failure with preserved ejection fraction (HFpEF) (HCC) - no s/s exac - hold lasix  Hypothyroidism - Continue Synthroid  Chronic pain syndrome - Database reviewed - Continue home Percocet  Anxiety and depression - Continue clonazepam - Continue Prozac and duloxetine  Hypokalemia - Replete as needed  Morbid obesity due to excess calories ([place]) - Complicates overall prognosis and care - Body mass index is 51.42 kg/m.   Old records reviewed in assessment of this  patient  Antimicrobials:   DVT prophylaxis:   apixaban (ELIQUIS) tablet 5 mg   Code Status:   Code Status: Full Code  Mobility Assessment (last 72 hours)     Mobility Assessment     Row Name 04/05/22 2100 04/04/22 2313 04/04/22 12:46:31       Does patient have an order for bedrest or is patient medically unstable No - Continue assessment No - Continue assessment No - Continue assessment     What is the highest level of mobility based on the progressive mobility assessment? Level 5 (Walks with assist in room/hall) - Balance while stepping forward/back and can walk in room with assist - Complete Level 5 (Walks with assist in room/hall) - Balance while stepping forward/back and can walk in room with assist - Complete Level 5 (Walks with assist in room/hall) - Balance while stepping forward/back and can walk in room with assist - Complete              Barriers to discharge:  Disposition Plan:  Home 1-2 days Status is: Inpt  Objective: Blood pressure 131/70, pulse 69, temperature 98.4 F (36.9 C), temperature source Oral, resp. rate 18, height '5\' 6"'$  (1.676 m), weight (!) 144.5 kg, last menstrual period 03/14/2022, SpO2 100 %.  Examination:  Physical Exam Constitutional:      Appearance: Normal appearance. She is obese.  HENT:     Head: Normocephalic and atraumatic.     Mouth/Throat:     Mouth: Mucous membranes are moist.  Eyes:     Extraocular Movements: Extraocular movements intact.  Cardiovascular:     Rate and Rhythm: Normal  rate and regular rhythm.  Pulmonary:     Effort: Pulmonary effort is normal.     Breath sounds: Wheezing present.  Abdominal:     General: Bowel sounds are normal. There is no distension.     Palpations: Abdomen is soft.     Tenderness: There is no abdominal tenderness.  Musculoskeletal:        General: Normal range of motion.     Cervical back: Normal range of motion and neck supple.  Skin:    General: Skin is warm.  Neurological:      General: No focal deficit present.     Mental Status: She is alert.  Psychiatric:        Mood and Affect: Mood normal.      Consultants:    Procedures:    Data Reviewed: Results for orders placed or performed during the hospital encounter of 04/04/22 (from the past 24 hour(s))  Basic metabolic panel     Status: Abnormal   Collection Time: 04/06/22  4:06 AM  Result Value Ref Range   Sodium 137 135 - 145 mmol/L   Potassium 3.9 3.5 - 5.1 mmol/L   Chloride 109 98 - 111 mmol/L   CO2 22 22 - 32 mmol/L   Glucose, Bld 82 70 - 99 mg/dL   BUN 17 6 - 20 mg/dL   Creatinine, Ser 0.82 0.44 - 1.00 mg/dL   Calcium 8.6 (L) 8.9 - 10.3 mg/dL   GFR, Estimated >60 >60 mL/min   Anion gap 6 5 - 15  CBC with Differential/Platelet     Status: Abnormal   Collection Time: 04/06/22  4:06 AM  Result Value Ref Range   WBC 16.8 (H) 4.0 - 10.5 K/uL   RBC 4.46 3.87 - 5.11 MIL/uL   Hemoglobin 11.5 (L) 12.0 - 15.0 g/dL   HCT 37.1 36.0 - 46.0 %   MCV 83.2 80.0 - 100.0 fL   MCH 25.8 (L) 26.0 - 34.0 pg   MCHC 31.0 30.0 - 36.0 g/dL   RDW 18.7 (H) 11.5 - 15.5 %   Platelets 384 150 - 400 K/uL   nRBC 0.0 0.0 - 0.2 %   Neutrophils Relative % 54 %   Neutro Abs 9.0 (H) 1.7 - 7.7 K/uL   Lymphocytes Relative 34 %   Lymphs Abs 5.7 (H) 0.7 - 4.0 K/uL   Monocytes Relative 11 %   Monocytes Absolute 1.9 (H) 0.1 - 1.0 K/uL   Eosinophils Relative 0 %   Eosinophils Absolute 0.0 0.0 - 0.5 K/uL   Basophils Relative 0 %   Basophils Absolute 0.1 0.0 - 0.1 K/uL   Immature Granulocytes 1 %   Abs Immature Granulocytes 0.16 (H) 0.00 - 0.07 K/uL   Reactive, Benign Lymphocytes PRESENT    Burr Cells PRESENT    Platelet Morphology NORMAL   Magnesium     Status: None   Collection Time: 04/06/22  4:06 AM  Result Value Ref Range   Magnesium 2.1 1.7 - 2.4 mg/dL   *Note: Due to a large number of results and/or encounters for the requested time period, some results have not been displayed. A complete set of results can be found  in Results Review.    I have Reviewed nursing notes, Vitals, and Lab results since pt's last encounter. Pertinent lab results : see above I have ordered test including BMP, CBC, Mg I have reviewed the last note from staff over past 24 hours I have discussed pt's care plan and test  results with nursing staff, case manager  Time spent: Greater than 50% of the 55 minute visit was spent in counseling/coordination of care for the patient as laid out in the A&P.    LOS: 1 day   Dwyane Dee, MD [facility] 04/06/2022, 1:25 PM

## 2022-04-06 NOTE — Progress Notes (Signed)
Mobility Specialist - Progress Note  (4L [place]) Pre-mobility: 76 bpm HR, 99% SpO2 During mobility: 102 bpm HR, 95% SpO2 Post-mobility: 75 bpm HR, 99% SPO2   04/06/22 1054  Mobility  Activity Ambulated independently to bathroom;Ambulated independently in hallway  Level of Assistance Independent  Assistive Device None  Distance Ambulated (ft) 50 ft  Range of Motion/Exercises Active  Activity Response Tolerated well  Mobility Referral Yes  $Mobility charge 1 Mobility   Pt was found in bed and agreeable to ambulate. Proceeded to take off O2 to ambulate to bathroom and stated that she was not ready to leave that she would be right back if she left. During ambulation in hallway was coughing and wheezing and stated feeling a little lightheaded. Stated wanting to try ambulation later on as well and wanting a PRN. At EOS returned to bed with necessities in reach and RN notified of session.   Ferd Hibbs Mobility Specialist

## 2022-04-06 NOTE — Progress Notes (Signed)
SATURATION QUALIFICATIONS: (This note is used to comply with regulatory documentation for home oxygen)  Patient Saturations on Room Air at Rest = NA  Patient Saturations on Room Air while Ambulating = NA%  Patient Saturations on 4 Liters of oxygen while Ambulating = 95%  Please briefly explain why patient needs home oxygen:  Pt oxygen sat at rest on 4L=99%

## 2022-04-06 NOTE — Progress Notes (Signed)
Patient does not want to wear cpap tonight ?

## 2022-04-06 NOTE — TOC Progression Note (Addendum)
Transition of Care [facility]) - Progression Note    Patient Details  Name: April [name] MRN: [id number] Date of Birth: 1971/02/20  Transition of Care [facility]) CM/SW Contact  Leeroy Cha, RN Phone Number: 04/06/2022, 11:08 AM  Clinical Narrative:    Patient does not want to wear her o2 when up.  Unable to get levels of o2 needs.  Patient has o2 at home.  Needs a travel tank. Dspoke with patient she thinks that her o2 may be through [employer].  Did inform her that if the md states that she is ready and stable to go home that this is the expectation.  States that she can make arrangement to go home. O2 travel tank ordered through [employer]. Per [employer] her 49 is not through their company and they can not give her a travel tzank. [facility]- will look patient up and see if o2 is through [place].  [facility]. Need an order for the o2-in the system- and sats.  Floor rn is getting them.  O2 will be delivered tomorrow am. Expected Discharge Plan: Home/Self Care Barriers to Discharge: Continued Medical Work up  Expected Discharge Plan and Services Expected Discharge Plan: Home/Self Care   Discharge Planning Services: CM Consult   Living arrangements for the past 2 months: Apartment Expected Discharge Date: 04/06/22                                     Social Determinants of Health (SDOH) Interventions    Readmission Risk Interventions   Row Labels 01/10/2022   11:28 AM 10/24/2021   12:29 PM 10/16/2021   11:39 AM  Readmission Risk Prevention Plan   Section Header. No data exists in this row.     Transportation Screening   Complete Complete Complete  Medication Review Press photographer)   Complete Complete Complete  PCP or Specialist appointment within 3-5 days of discharge   Complete Complete Complete  HRI or [place]   Complete Complete Complete  SW Recovery Care/Counseling Consult   Complete Complete   Palliative Care Screening   Not Applicable Not [place]   Not Applicable Not Applicable

## 2022-04-07 DIAGNOSIS — J4551 Severe persistent asthma with (acute) exacerbation: Secondary | ICD-10-CM | POA: Diagnosis not present

## 2022-04-07 NOTE — Plan of Care (Signed)
  Problem: Education: Goal: Knowledge of disease or condition will improve Outcome: Progressing Goal: Knowledge of the prescribed therapeutic regimen will improve Outcome: Progressing   Problem: Activity: Goal: Ability to tolerate increased activity will improve Outcome: Progressing   Problem: Respiratory: Goal: Ability to maintain a clear airway will improve Outcome: Progressing Goal: Ability to maintain adequate ventilation will improve Outcome: Progressing   Problem: Education: Goal: Knowledge of General Education information will improve Description: Including pain rating scale, medication(s)/side effects and non-pharmacologic comfort measures Outcome: Progressing

## 2022-04-07 NOTE — TOC Transition Note (Signed)
Transition of Care [facility]) - CM/SW Discharge Note   Patient Details  Name: April [name] MRN: [id number] Date of Birth: [birth year]  Transition of Care [facility]) CM/SW Contact:  Henrietta Dine, RN Phone Number: 04/07/2022, 1:24 PM   Clinical Narrative:    Review pf pt's old chart showed pt received home oxygen w/ [employer]; Jermain at [facility] notified and will have portable tank delivered to the pt's room; pt notified of delivery and contact information for agency will be placed in the follow up provider section of d/c instructions; no TOC needs.     Barriers to Discharge: Continued Medical Work up   Patient Goals and CMS Choice Patient states their goals for this hospitalization and ongoing recovery are:: to return home CMS Medicare.gov Compare Post Acute Care list provided to:: Legal Guardian    Discharge Placement                       Discharge Plan and Services   Discharge Planning Services: CM Consult                                 Social Determinants of Health (SDOH) Interventions     Readmission Risk Interventions    01/10/2022   11:28 AM 10/24/2021   12:29 PM 10/16/2021   11:39 AM  Readmission Risk Prevention Plan  Transportation Screening Complete Complete Complete  Medication Review Press photographer) Complete Complete Complete  PCP or Specialist appointment within 3-5 days of discharge Complete Complete Complete  HRI or Home Care Consult Complete Complete Complete  SW Recovery Care/Counseling Consult Complete Complete   Palliative Care Screening Not Applicable Not [place] Not Applicable Not Applicable

## 2022-04-07 NOTE — Progress Notes (Signed)
AVS and discharge instructions reviewed w/ patient. Patient verbalized understanding, had no further questions, and called sister to transport patient home. Home oxygen tank was delivered to patient's move prior to discharge.

## 2022-04-07 NOTE — TOC Progression Note (Addendum)
Transition of Care [facility]) - Progression Note    Patient Details  Name: April [name] MRN: [id number] Date of Birth: 1970/10/06  Transition of Care [facility]) CM/SW Contact  Henrietta Dine, RN Phone Number: 04/07/2022, 9:52 AM  Clinical Narrative:    D/C orders received; Ace Gins was to deliver oxygen to room; contacted [place] at agency and she says the pt needs a new account set up; called pt [phone]) and she says she is not sure what agency brings her large tank of oxygen at home; called pt's sister Disa Riedlinger [phone]) and she says she will go to the pt's home and call this CM back w/ agency name; awaiting return call.  [phone] - called pt's sister Tiffany to obtain home oxygen information; unable to LVM; message says VM is full; awaiting return call.  Expected Discharge Plan: Home/Self Care Barriers to Discharge: Continued Medical Work up  Expected Discharge Plan and Services Expected Discharge Plan: Home/Self Care   Discharge Planning Services: CM Consult   Living arrangements for the past 2 months: Apartment Expected Discharge Date: 04/07/22                                     Social Determinants of Health (SDOH) Interventions    Readmission Risk Interventions    01/10/2022   11:28 AM 10/24/2021   12:29 PM 10/16/2021   11:39 AM  Readmission Risk Prevention Plan  Transportation Screening Complete Complete Complete  Medication Review Press photographer) Complete Complete Complete  PCP or Specialist appointment within 3-5 days of discharge Complete Complete Complete  HRI or Home Care Consult Complete Complete Complete  SW Recovery Care/Counseling Consult Complete Complete   Palliative Care Screening Not Applicable Not [place] Not Applicable Not Applicable

## 2022-04-07 NOTE — Discharge Summary (Signed)
Physician Discharge Summary   April [name] JDY:[id number] DOB: 1970-12-02 DOA: 04/04/2022  PCP: [name], MD  Admit date: 04/04/2022 Discharge date: 04/07/2022 Barriers to discharge: Patient nor sister know of home O2 company  Admitted From: Home Disposition:  Home Discharging physician: Dwyane Dee, MD  Recommendations for Outpatient Follow-up:  Wean O2 as able  Home Health:  Equipment/Devices:   Discharge Condition: stable CODE STATUS: Full Diet recommendation:  Diet Orders (From admission, onward)     Start     Ordered   04/07/22 0000  Diet - low sodium heart healthy        04/07/22 0929   04/06/22 0000  Diet - low sodium heart healthy        04/06/22 0948   04/05/22 1241  Diet regular Room service appropriate? Yes; Fluid consistency: Thin  Diet effective now       Question Answer Comment  Room service appropriate? Yes   Fluid consistency: Thin      04/05/22 1240            Hospital Course: Ms. Gatliff is a 51 yo female with PMH asthma, anxiety, chronic pain, COPD, depression, GERD, HTN, morbid obesity, OSA on CPAP, prediabetes, RA who presented with shortness of breath, wheezing, nonproductive cough.  Symptoms began a few days prior to admission and were not improving therefore she presented for further evaluation.  She does live with a current smoker and endorses having worsening breathing due to this. CXR obtained on admission was negative for underlying infiltrates or edema.  She was admitted for asthma exacerbation and started on steroids and breathing treatments.  Assessment and Plan: * Severe persistent asthma with acute exacerbation in adult - Dyspnea ultimately improved with further treatment in the hospital - Responded to steroids and nebulizers - Continue course of prednisone at discharge - COVID, flu, RVP negative  HTN (hypertension) - Resume amlodipine, Coreg  AF (paroxysmal atrial fibrillation) (HCC) - Continue Coreg and  Eliquis  Chronic heart failure with preserved ejection fraction (HFpEF) (HCC) - no s/s exac  Hypothyroidism - Continue Synthroid  Chronic pain syndrome - Database reviewed - Continue home Percocet  Anxiety and depression - Continue clonazepam - Continue Prozac and duloxetine  Hypokalemia - Replete as needed  Morbid obesity due to excess calories ([place]) - Complicates overall prognosis and care - Body mass index is 51.42 kg/m.       The patient's chronic medical conditions were treated accordingly per the patient's home medication regimen except as noted.  On day of discharge, patient was felt deemed stable for discharge. Patient/family member advised to call PCP or come back to ER if needed.   Principal Diagnosis: Severe persistent asthma with acute exacerbation in adult  Discharge Diagnoses: Active Hospital Problems   Diagnosis Date Noted   Severe persistent asthma with acute exacerbation in adult 08/23/2021    Priority: 1.   HTN (hypertension) 07/31/2006    Priority: 2.   AF (paroxysmal atrial fibrillation) ([place])     Priority: 3.   Chronic heart failure with preserved ejection fraction (HFpEF) ([place]) 08/23/2021    Priority: 4.   Hypothyroidism 08/23/2021    Priority: 5.   Severe persistent asthma with (acute) exacerbation 04/05/2022   Chronic pain syndrome    Anxiety and depression 08/30/2015   GERD (gastroesophageal reflux disease) 08/30/2015   Obstructive sleep apnea 12/19/2010   Hypokalemia 08/13/2010   Morbid obesity due to excess calories ([place]) 06/17/2006    Resolved Hospital Problems  No  resolved problems to display.     Discharge Instructions     Diet - low sodium heart healthy   Complete by: As directed    Diet - low sodium heart healthy   Complete by: As directed    Increase activity slowly   Complete by: As directed    Increase activity slowly   Complete by: As directed       Allergies as of 04/07/2022       Reactions   Contrast Media  [iodinated Contrast Media] Itching   CT contrast. Unknown per PT.         Medication List     STOP taking these medications    omeprazole 20 MG capsule Commonly known as: PRILOSEC   omeprazole 40 MG capsule Commonly known as: PRILOSEC       TAKE these medications    albuterol 108 (90 Base) MCG/ACT inhaler Commonly known as: ProAir HFA Inhale 2 puffs into the lungs every 6 (six) hours as needed for wheezing or shortness of breath. What changed: when to take this   amLODipine 10 MG tablet Commonly known as: NORVASC Take 1 tablet (10 mg total) by mouth daily.   apixaban 5 MG Tabs tablet Commonly known as: ELIQUIS Take 1 tablet (5 mg total) by mouth 2 (two) times daily.   carvedilol 6.25 MG tablet Commonly known as: COREG TAKE 1 TABLET(6.25 MG) BY MOUTH TWICE DAILY WITH A MEAL What changed: See the new instructions.   clonazePAM 0.5 MG tablet Commonly known as: KlonoPIN Take 1 tablet (0.5 mg total) by mouth daily as needed for anxiety. What changed: when to take this   cloNIDine 0.2 MG tablet Commonly known as: CATAPRES Take 1 tablet (0.2 mg total) by mouth 2 (two) times daily.   DULoxetine 60 MG capsule Commonly known as: CYMBALTA Take 60 mg by mouth daily. What changed: Another medication with the same name was removed. Continue taking this medication, and follow the directions you see here.   EPINEPHrine 0.3 mg/0.3 mL Soaj injection Commonly known as: EPI-PEN Inject 0.3 mg into the muscle as needed for anaphylaxis.   famotidine 40 MG tablet Commonly known as: PEPCID Take 40 mg by mouth daily.   FLUoxetine 10 MG capsule Commonly known as: PROZAC Take 2 capsules (20 mg total) by mouth daily. Take 1 capsule for first 7 days and then start taking 2 capsules daily. What changed:  how much to take additional instructions   fluticasone 50 MCG/ACT nasal spray Commonly known as: FLONASE SHAKE LIQUID AND USE 1 SPRAY IN EACH NOSTRIL DAILY AS NEEDED FOR  ALLERGIES OR RHINITIS What changed: See the new instructions.   furosemide 20 MG tablet Commonly known as: LASIX Take 1 tablet (20 mg total) by mouth daily.   ipratropium-albuterol 0.5-2.5 (3) MG/3ML Soln Commonly known as: DUONEB 1 neb every 6 hours if needed for asthma What changed:  how much to take how to take this when to take this reasons to take this additional instructions Another medication with the same name was removed. Continue taking this medication, and follow the directions you see here.   levothyroxine 50 MCG tablet Commonly known as: SYNTHROID Take 1 tablet (50 mcg total) by mouth daily.   naloxone 4 MG/0.1ML Liqd nasal spray kit Commonly known as: NARCAN Place 1 spray into the nose once as needed (opioid overdose).   oxyCODONE-acetaminophen 10-325 MG tablet Commonly known as: PERCOCET Take 1 tablet by mouth 5 (five) times daily as needed for pain.  Ozempic (1 MG/DOSE) 4 MG/3ML Sopn Generic drug: Semaglutide (1 MG/DOSE) Inject 1 mg into the skin once a week.   phentermine 15 MG capsule Take 15 mg by mouth daily.   potassium chloride SA 20 MEQ tablet Commonly known as: KLOR-CON M TAKE 1 TABLET(20 MEQ) BY MOUTH DAILY What changed: See the new instructions.   predniSONE 20 MG tablet Commonly known as: DELTASONE Take 2 tablets (40 mg total) by mouth daily with breakfast for 5 days. What changed:  medication strength how much to take how to take this when to take this additional instructions   pregabalin 300 MG capsule Commonly known as: LYRICA Take 300 mg by mouth in the morning, at noon, and at bedtime.   Tezspire 210 MG/1.91ML Soaj Generic drug: Tezepelumab-ekko Inject 210 mg into the skin every 28 (twenty-eight) days.   topiramate 200 MG tablet Commonly known as: TOPAMAX Take 200 mg by mouth at bedtime.   Trelegy Ellipta 100-62.5-25 MCG/ACT Aepb Generic drug: Fluticasone-Umeclidin-Vilant Inhale 1 puff into the lungs daily.   Vitamin  D (Ergocalciferol) 1.25 MG (50000 UNIT) Caps capsule Commonly known as: DRISDOL Take 50,000 Units by mouth once a week.   zolpidem 10 MG tablet Commonly known as: AMBIEN TAKE 1 TABLET BY MOUTH AT BEDTIME AS NEEDED FOR SLEEP What changed:  how much to take how to take this when to take this additional instructions               Durable Medical Equipment  (From admission, onward)           Start     Ordered   04/06/22 1104  For home use only DME oxygen  Once       Question Answer Comment  Length of Need 6 Months   Mode or (Route) Nasal cannula   Liters per Minute 4   Frequency Continuous (stationary and portable oxygen unit needed)   Oxygen conserving device Yes   Oxygen delivery system Gas      04/06/22 1104            Allergies  Allergen Reactions   Contrast Media [Iodinated Contrast Media] Itching    CT contrast. Unknown per PT.     Consultations:   Procedures:   Discharge Exam: BP (!) 150/75 (BP Location: Right Arm)   Pulse 71   Temp 98.7 F (37.1 C) (Oral)   Resp 14   Ht _0  (1.676 m)   Wt (!) 144.5 kg   LMP 03/14/2022 (Approximate)   SpO2 99%   BMI 51.42 kg/m  Physical Exam Constitutional:      Appearance: Normal appearance. She is obese.  HENT:     Head: Normocephalic and atraumatic.     Mouth/Throat:     Mouth: Mucous membranes are moist.  Eyes:     Extraocular Movements: Extraocular movements intact.  Cardiovascular:     Rate and Rhythm: Normal rate and regular rhythm.  Pulmonary:     Effort: Pulmonary effort is normal. No respiratory distress.     Breath sounds: No wheezing.     Comments: Improved coarse breath sounds bilaterally Abdominal:     General: Bowel sounds are normal. There is no distension.     Palpations: Abdomen is soft.     Tenderness: There is no abdominal tenderness.  Musculoskeletal:        General: Normal range of motion.     Cervical back: Normal range of motion and neck supple.  Skin:  General: Skin is warm.  Neurological:     General: No focal deficit present.     Mental Status: She is alert.  Psychiatric:        Mood and Affect: Mood normal.      The results of significant diagnostics from this hospitalization (including imaging, microbiology, ancillary and laboratory) are listed below for reference.   Microbiology: Recent Results (from the past 240 hour(s))  Resp Panel by RT-PCR (Flu A&B, Covid) Anterior Nasal Swab     Status: None   Collection Time: 04/04/22  5:35 AM   Specimen: Anterior Nasal Swab  Result Value Ref Range Status   SARS Coronavirus 2 by RT PCR NEGATIVE NEGATIVE Final    Comment: (NOTE) SARS-CoV-2 target nucleic acids are NOT DETECTED.  The SARS-CoV-2 RNA is generally detectable in upper respiratory specimens during the acute phase of infection. The lowest concentration of SARS-CoV-2 viral copies this assay can detect is 138 copies/mL. A negative result does not preclude SARS-Cov-2 infection and should not be used as the sole basis for treatment or other patient management decisions. A negative result may occur with  improper specimen collection/handling, submission of specimen other than nasopharyngeal swab, presence of viral mutation(s) within the areas targeted by this assay, and inadequate number of viral copies(<138 copies/mL). A negative result must be combined with clinical observations, patient history, and epidemiological information. The expected result is Negative.  Fact Sheet for Patients:  EntrepreneurPulse.com.au  Fact Sheet for Healthcare Providers:  IncredibleEmployment.be  This test is no t yet approved or cleared by the [place] FDA and  has been authorized for detection and/or diagnosis of SARS-CoV-2 by FDA under an Emergency Use Authorization (EUA). This EUA will remain  in effect (meaning this test can be used) for the duration of the COVID-19 declaration under Section  564(b)(1) of the Act, 21 U.S.C.section 360bbb-3(b)(1), unless the authorization is terminated  or revoked sooner.       Influenza A by PCR NEGATIVE NEGATIVE Final   Influenza B by PCR NEGATIVE NEGATIVE Final    Comment: (NOTE) The Xpert Xpress SARS-CoV-2/FLU/RSV plus assay is intended as an aid in the diagnosis of influenza from Nasopharyngeal swab specimens and should not be used as a sole basis for treatment. Nasal washings and aspirates are unacceptable for Xpert Xpress SARS-CoV-2/FLU/RSV testing.  Fact Sheet for Patients: EntrepreneurPulse.com.au  Fact Sheet for Healthcare Providers: IncredibleEmployment.be  This test is not yet approved or cleared by the [place] FDA and has been authorized for detection and/or diagnosis of SARS-CoV-2 by FDA under an Emergency Use Authorization (EUA). This EUA will remain in effect (meaning this test can be used) for the duration of the COVID-19 declaration under Section 564(b)(1) of the Act, 21 U.S.C. section 360bbb-3(b)(1), unless the authorization is terminated or revoked.  Performed at [facility], [place] [address] [place], [place] [address]   Respiratory (~20 pathogens) panel by PCR     Status: None   Collection Time: 04/04/22  5:35 AM   Specimen: Nasopharyngeal Swab; Respiratory  Result Value Ref Range Status   Adenovirus NOT DETECTED NOT DETECTED Final   Coronavirus 229E NOT DETECTED NOT DETECTED Final    Comment: (NOTE) The Coronavirus on the Respiratory Panel, DOES NOT test for the novel  Coronavirus (2019 nCoV)    Coronavirus HKU1 NOT DETECTED NOT DETECTED Final   Coronavirus NL63 NOT DETECTED NOT DETECTED Final   Coronavirus OC43 NOT DETECTED NOT DETECTED Final   Metapneumovirus NOT DETECTED NOT DETECTED  Final   Rhinovirus / Enterovirus NOT DETECTED NOT DETECTED Final   Influenza A NOT DETECTED NOT DETECTED Final   Influenza B NOT DETECTED NOT DETECTED Final    Parainfluenza Virus 1 NOT DETECTED NOT DETECTED Final   Parainfluenza Virus 2 NOT DETECTED NOT DETECTED Final   Parainfluenza Virus 3 NOT DETECTED NOT DETECTED Final   Parainfluenza Virus 4 NOT DETECTED NOT DETECTED Final   Respiratory Syncytial Virus NOT DETECTED NOT DETECTED Final   Bordetella pertussis NOT DETECTED NOT DETECTED Final   Bordetella Parapertussis NOT DETECTED NOT DETECTED Final   Chlamydophila pneumoniae NOT DETECTED NOT DETECTED Final   Mycoplasma pneumoniae NOT DETECTED NOT DETECTED Final    Comment: Performed at [facility], [place] [address] [place], [place] [address]     Labs: BNP (last 3 results) Recent Labs    10/23/21 1632 01/07/22 2342 04/04/22 0535  BNP 18.9 26.6 60.6   Basic Metabolic Panel: Recent Labs  Lab 04/04/22 0535 04/04/22 0800 04/05/22 0409 04/06/22 0406  NA 141  --  135 137  K 2.9*  --  4.0 3.9  CL 110  --  109 109  CO2 21*  --  16* 22  GLUCOSE 92  --  93 82  BUN 11  --  12 17  CREATININE 0.94  --  0.88 0.82  CALCIUM 8.4*  --  8.4* 8.6*  MG  --  2.0  --  2.1   Liver Function Tests: Recent Labs  Lab 04/05/22 0409  AST 19  ALT 14  ALKPHOS 64  BILITOT 0.4  PROT 7.2  ALBUMIN 3.4*   No results for input(s): "LIPASE", "AMYLASE" in the last 168 hours. No results for input(s): "AMMONIA" in the last 168 hours. CBC: Recent Labs  Lab 04/04/22 0535 04/05/22 0409 04/06/22 0406  WBC 19.3* 16.9* 16.8*  NEUTROABS 10.1*  --  9.0*  HGB 11.4* 11.5* 11.5*  HCT 37.3 38.4 37.1  MCV 82.5 84.4 83.2  PLT 362 331 384   Cardiac Enzymes: No results for input(s): "CKTOTAL", "CKMB", "CKMBINDEX", "TROPONINI" in the last 168 hours. BNP: Invalid input(s): "POCBNP" CBG: No results for input(s): "GLUCAP" in the last 168 hours. D-Dimer No results for input(s): "DDIMER" in the last 72 hours. Hgb A1c No results for input(s): "HGBA1C" in the last 72 hours. Lipid Profile No results for input(s): "CHOL", "HDL", "LDLCALC", "TRIG", "CHOLHDL",  "LDLDIRECT" in the last 72 hours. Thyroid function studies No results for input(s): "TSH", "T4TOTAL", "T3FREE", "THYROIDAB" in the last 72 hours.  Invalid input(s): "FREET3" Anemia work up No results for input(s): "VITAMINB12", "FOLATE", "FERRITIN", "TIBC", "IRON", "RETICCTPCT" in the last 72 hours. Urinalysis    Component Value Date/Time   COLORURINE YELLOW 10/23/2021 1826   APPEARANCEUR CLOUDY (A) 10/23/2021 1826   LABSPEC 1.027 10/23/2021 1826   PHURINE 5.0 10/23/2021 1826   GLUCOSEU NEGATIVE 10/23/2021 1826   GLUCOSEU NEG mg/dL 10/28/2007 2049   HGBUR NEGATIVE 10/23/2021 1826   BILIRUBINUR NEGATIVE 10/23/2021 1826   KETONESUR NEGATIVE 10/23/2021 1826   PROTEINUR NEGATIVE 10/23/2021 1826   UROBILINOGEN 1.0 11/21/2014 0707   NITRITE NEGATIVE 10/23/2021 1826   LEUKOCYTESUR LARGE (A) 10/23/2021 1826   Sepsis Labs Recent Labs  Lab 04/04/22 0535 04/05/22 0409 04/06/22 0406  WBC 19.3* 16.9* 16.8*   Microbiology Recent Results (from the past 240 hour(s))  Resp Panel by RT-PCR (Flu A&B, Covid) Anterior Nasal Swab     Status: None   Collection Time: 04/04/22  5:35 AM   Specimen:  Anterior Nasal Swab  Result Value Ref Range Status   SARS Coronavirus 2 by RT PCR NEGATIVE NEGATIVE Final    Comment: (NOTE) SARS-CoV-2 target nucleic acids are NOT DETECTED.  The SARS-CoV-2 RNA is generally detectable in upper respiratory specimens during the acute phase of infection. The lowest concentration of SARS-CoV-2 viral copies this assay can detect is 138 copies/mL. A negative result does not preclude SARS-Cov-2 infection and should not be used as the sole basis for treatment or other patient management decisions. A negative result may occur with  improper specimen collection/handling, submission of specimen other than nasopharyngeal swab, presence of viral mutation(s) within the areas targeted by this assay, and inadequate number of viral copies(<138 copies/mL). A negative result must  be combined with clinical observations, patient history, and epidemiological information. The expected result is Negative.  Fact Sheet for Patients:  EntrepreneurPulse.com.au  Fact Sheet for Healthcare Providers:  IncredibleEmployment.be  This test is no t yet approved or cleared by the [place] FDA and  has been authorized for detection and/or diagnosis of SARS-CoV-2 by FDA under an Emergency Use Authorization (EUA). This EUA will remain  in effect (meaning this test can be used) for the duration of the COVID-19 declaration under Section 564(b)(1) of the Act, 21 U.S.C.section 360bbb-3(b)(1), unless the authorization is terminated  or revoked sooner.       Influenza A by PCR NEGATIVE NEGATIVE Final   Influenza B by PCR NEGATIVE NEGATIVE Final    Comment: (NOTE) The Xpert Xpress SARS-CoV-2/FLU/RSV plus assay is intended as an aid in the diagnosis of influenza from Nasopharyngeal swab specimens and should not be used as a sole basis for treatment. Nasal washings and aspirates are unacceptable for Xpert Xpress SARS-CoV-2/FLU/RSV testing.  Fact Sheet for Patients: EntrepreneurPulse.com.au  Fact Sheet for Healthcare Providers: IncredibleEmployment.be  This test is not yet approved or cleared by the [place] FDA and has been authorized for detection and/or diagnosis of SARS-CoV-2 by FDA under an Emergency Use Authorization (EUA). This EUA will remain in effect (meaning this test can be used) for the duration of the COVID-19 declaration under Section 564(b)(1) of the Act, 21 U.S.C. section 360bbb-3(b)(1), unless the authorization is terminated or revoked.  Performed at [facility], [place] [address], [place], [place] [address]   Respiratory (~20 pathogens) panel by PCR     Status: None   Collection Time: 04/04/22  5:35 AM   Specimen: Nasopharyngeal Swab; Respiratory  Result Value  Ref Range Status   Adenovirus NOT DETECTED NOT DETECTED Final   Coronavirus 229E NOT DETECTED NOT DETECTED Final    Comment: (NOTE) The Coronavirus on the Respiratory Panel, DOES NOT test for the novel  Coronavirus (2019 nCoV)    Coronavirus HKU1 NOT DETECTED NOT DETECTED Final   Coronavirus NL63 NOT DETECTED NOT DETECTED Final   Coronavirus OC43 NOT DETECTED NOT DETECTED Final   Metapneumovirus NOT DETECTED NOT DETECTED Final   Rhinovirus / Enterovirus NOT DETECTED NOT DETECTED Final   Influenza A NOT DETECTED NOT DETECTED Final   Influenza B NOT DETECTED NOT DETECTED Final   Parainfluenza Virus 1 NOT DETECTED NOT DETECTED Final   Parainfluenza Virus 2 NOT DETECTED NOT DETECTED Final   Parainfluenza Virus 3 NOT DETECTED NOT DETECTED Final   Parainfluenza Virus 4 NOT DETECTED NOT DETECTED Final   Respiratory Syncytial Virus NOT DETECTED NOT DETECTED Final   Bordetella pertussis NOT DETECTED NOT DETECTED Final   Bordetella Parapertussis NOT DETECTED NOT DETECTED Final  Chlamydophila pneumoniae NOT DETECTED NOT DETECTED Final   Mycoplasma pneumoniae NOT DETECTED NOT DETECTED Final    Comment: Performed at [facility], [place] [address], [place], [place] [address]    Procedures/Studies: DG Chest Port 1 View  Result Date: 04/04/2022 CLINICAL DATA:  Shortness of breath for 4 days. EXAM: PORTABLE CHEST 1 VIEW COMPARISON:  01/07/2022 FINDINGS: 0510 hours. Cardiopericardial silhouette is at upper limits of normal for size. The lungs are clear without focal pneumonia, edema, pneumothorax or pleural effusion. Evaluation of lung parenchyma somewhat limited by technique and overlying soft tissue. Telemetry leads overlie the chest. IMPRESSION: No active disease. Electronically Signed   By: Misty Stanley M.D.   On: 04/04/2022 06:11     Time coordinating discharge: Over 30 minutes    Dwyane Dee, MD  [facility] 04/07/2022, 12:31 PM

## 2022-04-08 ENCOUNTER — Telehealth: Payer: Self-pay

## 2022-04-08 NOTE — Telephone Encounter (Signed)
Transition Care Management Unsuccessful Follow-up Telephone Call  Date of discharge and from where:  04/07/2022, [facility]  Attempts:  1st Attempt  Reason for unsuccessful TCM follow-up call:  Left voice message [phone] , call back requested.

## 2022-04-09 ENCOUNTER — Encounter (HOSPITAL_COMMUNITY): Payer: Self-pay | Admitting: Emergency Medicine

## 2022-04-09 ENCOUNTER — Other Ambulatory Visit (HOSPITAL_COMMUNITY): Payer: Self-pay | Admitting: Psychiatry

## 2022-04-09 ENCOUNTER — Telehealth: Payer: Self-pay

## 2022-04-09 ENCOUNTER — Inpatient Hospital Stay (HOSPITAL_COMMUNITY)
Admission: EM | Admit: 2022-04-09 | Discharge: 2022-04-13 | DRG: 190 | Disposition: A | Discharge status: Home / Self Care | Payer: Medicare Other | Attending: Internal Medicine | Admitting: Internal Medicine

## 2022-04-09 ENCOUNTER — Emergency Department (HOSPITAL_COMMUNITY): Payer: Medicare Other

## 2022-04-09 ENCOUNTER — Other Ambulatory Visit: Payer: Self-pay

## 2022-04-09 ENCOUNTER — Telehealth (HOSPITAL_COMMUNITY): Payer: Self-pay | Admitting: Psychiatry

## 2022-04-09 ENCOUNTER — Telehealth (HOSPITAL_COMMUNITY): Payer: Self-pay

## 2022-04-09 DIAGNOSIS — J441 Chronic obstructive pulmonary disease with (acute) exacerbation: Secondary | ICD-10-CM | POA: Diagnosis present

## 2022-04-09 DIAGNOSIS — F419 Anxiety disorder, unspecified: Secondary | ICD-10-CM | POA: Diagnosis present

## 2022-04-09 DIAGNOSIS — I5032 Chronic diastolic (congestive) heart failure: Secondary | ICD-10-CM | POA: Diagnosis present

## 2022-04-09 DIAGNOSIS — I482 Chronic atrial fibrillation, unspecified: Secondary | ICD-10-CM | POA: Diagnosis present

## 2022-04-09 DIAGNOSIS — J302 Other seasonal allergic rhinitis: Secondary | ICD-10-CM | POA: Diagnosis present

## 2022-04-09 DIAGNOSIS — Z79899 Other long term (current) drug therapy: Secondary | ICD-10-CM | POA: Diagnosis not present

## 2022-04-09 DIAGNOSIS — F331 Major depressive disorder, recurrent, moderate: Secondary | ICD-10-CM

## 2022-04-09 DIAGNOSIS — J45901 Unspecified asthma with (acute) exacerbation: Secondary | ICD-10-CM | POA: Diagnosis present

## 2022-04-09 DIAGNOSIS — Z79890 Hormone replacement therapy (postmenopausal): Secondary | ICD-10-CM

## 2022-04-09 DIAGNOSIS — Z825 Family history of asthma and other chronic lower respiratory diseases: Secondary | ICD-10-CM

## 2022-04-09 DIAGNOSIS — I110 Hypertensive heart disease with heart failure: Secondary | ICD-10-CM | POA: Diagnosis present

## 2022-04-09 DIAGNOSIS — J9600 Acute respiratory failure, unspecified whether with hypoxia or hypercapnia: Secondary | ICD-10-CM | POA: Diagnosis present

## 2022-04-09 DIAGNOSIS — E876 Hypokalemia: Secondary | ICD-10-CM | POA: Diagnosis present

## 2022-04-09 DIAGNOSIS — F32A Depression, unspecified: Secondary | ICD-10-CM | POA: Diagnosis present

## 2022-04-09 DIAGNOSIS — J9602 Acute respiratory failure with hypercapnia: Secondary | ICD-10-CM | POA: Diagnosis present

## 2022-04-09 DIAGNOSIS — J4551 Severe persistent asthma with (acute) exacerbation: Secondary | ICD-10-CM | POA: Diagnosis present

## 2022-04-09 DIAGNOSIS — K219 Gastro-esophageal reflux disease without esophagitis: Secondary | ICD-10-CM | POA: Diagnosis present

## 2022-04-09 DIAGNOSIS — Z7952 Long term (current) use of systemic steroids: Secondary | ICD-10-CM | POA: Diagnosis not present

## 2022-04-09 DIAGNOSIS — E785 Hyperlipidemia, unspecified: Secondary | ICD-10-CM | POA: Diagnosis present

## 2022-04-09 DIAGNOSIS — Z91041 Radiographic dye allergy status: Secondary | ICD-10-CM

## 2022-04-09 DIAGNOSIS — Z7901 Long term (current) use of anticoagulants: Secondary | ICD-10-CM | POA: Diagnosis not present

## 2022-04-09 DIAGNOSIS — Z87891 Personal history of nicotine dependence: Secondary | ICD-10-CM

## 2022-04-09 DIAGNOSIS — Z20822 Contact with and (suspected) exposure to covid-19: Secondary | ICD-10-CM | POA: Diagnosis present

## 2022-04-09 DIAGNOSIS — M069 Rheumatoid arthritis, unspecified: Secondary | ICD-10-CM | POA: Diagnosis present

## 2022-04-09 DIAGNOSIS — J449 Chronic obstructive pulmonary disease, unspecified: Secondary | ICD-10-CM | POA: Diagnosis present

## 2022-04-09 DIAGNOSIS — Z91199 Patient's noncompliance with other medical treatment and regimen due to unspecified reason: Secondary | ICD-10-CM

## 2022-04-09 DIAGNOSIS — I480 Paroxysmal atrial fibrillation: Secondary | ICD-10-CM | POA: Diagnosis present

## 2022-04-09 DIAGNOSIS — J9601 Acute respiratory failure with hypoxia: Secondary | ICD-10-CM | POA: Diagnosis present

## 2022-04-09 DIAGNOSIS — G4733 Obstructive sleep apnea (adult) (pediatric): Secondary | ICD-10-CM | POA: Diagnosis present

## 2022-04-09 DIAGNOSIS — Z8249 Family history of ischemic heart disease and other diseases of the circulatory system: Secondary | ICD-10-CM

## 2022-04-09 DIAGNOSIS — Z7985 Long-term (current) use of injectable non-insulin antidiabetic drugs: Secondary | ICD-10-CM

## 2022-04-09 DIAGNOSIS — Z6841 Body Mass Index (BMI) 40.0 and over, adult: Secondary | ICD-10-CM

## 2022-04-09 LAB — I-STAT VENOUS BLOOD GAS, ED
Acid-base deficit: 7 mmol/L — ABNORMAL HIGH (ref 0.0–2.0)
Bicarbonate: 21.3 mmol/L (ref 20.0–28.0)
Calcium, Ion: 1.17 mmol/L (ref 1.15–1.40)
HCT: 40 % (ref 36.0–46.0)
Hemoglobin: 13.6 g/dL (ref 12.0–15.0)
O2 Saturation: 66 %
Potassium: 3.3 mmol/L — ABNORMAL LOW (ref 3.5–5.1)
Sodium: 141 mmol/L (ref 135–145)
TCO2: 23 mmol/L (ref 22–32)
pCO2, Ven: 52.1 mmHg (ref 44–60)
pH, Ven: 7.219 — ABNORMAL LOW (ref 7.25–7.43)
pO2, Ven: 42 mmHg (ref 32–45)

## 2022-04-09 LAB — CBC
HCT: 39.5 % (ref 36.0–46.0)
Hemoglobin: 12.4 g/dL (ref 12.0–15.0)
MCH: 25.7 pg — ABNORMAL LOW (ref 26.0–34.0)
MCHC: 31.4 g/dL (ref 30.0–36.0)
MCV: 82 fL (ref 80.0–100.0)
Platelets: 479 10*3/uL — ABNORMAL HIGH (ref 150–400)
RBC: 4.82 MIL/uL (ref 3.87–5.11)
RDW: 19 % — ABNORMAL HIGH (ref 11.5–15.5)
WBC: 19.4 10*3/uL — ABNORMAL HIGH (ref 4.0–10.5)
nRBC: 0 % (ref 0.0–0.2)

## 2022-04-09 LAB — BASIC METABOLIC PANEL
Anion gap: 11 (ref 5–15)
BUN: 16 mg/dL (ref 6–20)
CO2: 22 mmol/L (ref 22–32)
Calcium: 8.2 mg/dL — ABNORMAL LOW (ref 8.9–10.3)
Chloride: 108 mmol/L (ref 98–111)
Creatinine, Ser: 1.08 mg/dL — ABNORMAL HIGH (ref 0.44–1.00)
GFR, Estimated: >60 mL/min (ref >60)
Glucose, Bld: 122 mg/dL — ABNORMAL HIGH (ref 70–99)
Potassium: 3.2 mmol/L — ABNORMAL LOW (ref 3.5–5.1)
Sodium: 141 mmol/L (ref 135–145)

## 2022-04-09 MED ORDER — FLUOXETINE HCL 20 MG PO CAPS: 20.0000 mg | ORAL_CAPSULE | Freq: Every day | ORAL | 1 refills | Status: DC

## 2022-04-09 MED ORDER — OXYCODONE HCL 5 MG PO TABS
10.0000 mg | ORAL_TABLET | Freq: Every day | ORAL | Status: DC | PRN
  Administered 2022-04-10 – 2022-04-11 (×4): 10 mg via ORAL
  Filled 2022-04-09 (×4): qty 2

## 2022-04-09 MED ORDER — IPRATROPIUM-ALBUTEROL 0.5-2.5 (3) MG/3ML IN SOLN
3.0000 mL | Freq: Once | RESPIRATORY_TRACT | Status: AC
  Administered 2022-04-09: 3 mL via RESPIRATORY_TRACT
  Filled 2022-04-09: qty 3

## 2022-04-09 MED ORDER — PANTOPRAZOLE SODIUM 40 MG PO TBEC
40.0000 mg | DELAYED_RELEASE_TABLET | Freq: Every day | ORAL | Status: DC
  Administered 2022-04-09 – 2022-04-12 (×4): 40 mg via ORAL
  Filled 2022-04-09 (×4): qty 1

## 2022-04-09 MED ORDER — AMLODIPINE BESYLATE 10 MG PO TABS
10.0000 mg | ORAL_TABLET | Freq: Every day | ORAL | Status: DC
  Administered 2022-04-09 – 2022-04-12 (×4): 10 mg via ORAL
  Filled 2022-04-09: qty 2
  Filled 2022-04-09 (×3): qty 1

## 2022-04-09 MED ORDER — FLUTICASONE FUROATE-VILANTEROL 100-25 MCG/ACT IN AEPB
1.0000 | INHALATION_SPRAY | Freq: Every day | RESPIRATORY_TRACT | Status: DC
  Administered 2022-04-10 – 2022-04-12 (×3): 1 via RESPIRATORY_TRACT
  Filled 2022-04-09: qty 28

## 2022-04-09 MED ORDER — BENZONATATE 100 MG PO CAPS
200.0000 mg | ORAL_CAPSULE | Freq: Three times a day (TID) | ORAL | Status: DC
  Administered 2022-04-09 – 2022-04-12 (×9): 200 mg via ORAL
  Filled 2022-04-09 (×11): qty 2

## 2022-04-09 MED ORDER — IPRATROPIUM-ALBUTEROL 0.5-2.5 (3) MG/3ML IN SOLN
3.0000 mL | RESPIRATORY_TRACT | Status: AC
  Administered 2022-04-09 (×3): 3 mL via RESPIRATORY_TRACT
  Filled 2022-04-09: qty 6
  Filled 2022-04-09: qty 3

## 2022-04-09 MED ORDER — CARVEDILOL 6.25 MG PO TABS
6.2500 mg | ORAL_TABLET | Freq: Every day | ORAL | Status: DC
  Administered 2022-04-09 – 2022-04-12 (×4): 6.25 mg via ORAL
  Filled 2022-04-09 (×2): qty 1
  Filled 2022-04-09: qty 2
  Filled 2022-04-09: qty 1

## 2022-04-09 MED ORDER — GUAIFENESIN ER 600 MG PO TB12
1200.0000 mg | ORAL_TABLET | Freq: Two times a day (BID) | ORAL | Status: DC
  Administered 2022-04-09 – 2022-04-12 (×8): 1200 mg via ORAL
  Filled 2022-04-09 (×8): qty 2

## 2022-04-09 MED ORDER — CLONIDINE HCL 0.1 MG PO TABS
0.2000 mg | ORAL_TABLET | Freq: Two times a day (BID) | ORAL | Status: DC
  Administered 2022-04-09 – 2022-04-12 (×8): 0.2 mg via ORAL
  Filled 2022-04-09: qty 1
  Filled 2022-04-09 (×7): qty 2

## 2022-04-09 MED ORDER — ALBUTEROL SULFATE (2.5 MG/3ML) 0.083% IN NEBU: 2.5000 mg | INHALATION_SOLUTION | Freq: Four times a day (QID) | RESPIRATORY_TRACT | Status: DC | PRN

## 2022-04-09 MED ORDER — FAMOTIDINE 20 MG PO TABS
40.0000 mg | ORAL_TABLET | Freq: Every day | ORAL | Status: DC
  Administered 2022-04-09 – 2022-04-12 (×4): 40 mg via ORAL
  Filled 2022-04-09 (×4): qty 2

## 2022-04-09 MED ORDER — FLUOXETINE HCL 20 MG PO CAPS
20.0000 mg | ORAL_CAPSULE | Freq: Every day | ORAL | Status: DC
  Administered 2022-04-09 – 2022-04-12 (×4): 20 mg via ORAL
  Filled 2022-04-09 (×4): qty 1

## 2022-04-09 MED ORDER — MAGNESIUM SULFATE 2 GM/50ML IV SOLN
2.0000 g | Freq: Once | INTRAVENOUS | Status: AC
  Administered 2022-04-09: 2 g via INTRAVENOUS
  Filled 2022-04-09: qty 50

## 2022-04-09 MED ORDER — PHENTERMINE HCL 15 MG PO CAPS: 15.0000 mg | ORAL_CAPSULE | Freq: Every day | ORAL | Status: DC

## 2022-04-09 MED ORDER — ACETAMINOPHEN 325 MG PO TABS: 325.0000 mg | ORAL_TABLET | Freq: Every day | ORAL | Status: DC | PRN

## 2022-04-09 MED ORDER — IPRATROPIUM-ALBUTEROL 0.5-2.5 (3) MG/3ML IN SOLN: 3.0000 mL | Freq: Once | RESPIRATORY_TRACT | Status: DC

## 2022-04-09 MED ORDER — PREDNISONE 20 MG PO TABS
40.0000 mg | ORAL_TABLET | Freq: Every day | ORAL | Status: AC
  Administered 2022-04-10 – 2022-04-13 (×4): 40 mg via ORAL
  Filled 2022-04-09 (×4): qty 2

## 2022-04-09 MED ORDER — IPRATROPIUM-ALBUTEROL 0.5-2.5 (3) MG/3ML IN SOLN
3.0000 mL | Freq: Four times a day (QID) | RESPIRATORY_TRACT | Status: DC | PRN
  Administered 2022-04-10: 3 mL via RESPIRATORY_TRACT
  Filled 2022-04-09: qty 3

## 2022-04-09 MED ORDER — METHYLPREDNISOLONE SODIUM SUCC 40 MG IJ SOLR
40.0000 mg | Freq: Two times a day (BID) | INTRAMUSCULAR | Status: AC
  Administered 2022-04-09 (×2): 40 mg via INTRAVENOUS
  Filled 2022-04-09 (×3): qty 1

## 2022-04-09 MED ORDER — DOXYCYCLINE HYCLATE 100 MG PO TABS
100.0000 mg | ORAL_TABLET | Freq: Two times a day (BID) | ORAL | Status: DC
  Administered 2022-04-09 – 2022-04-12 (×8): 100 mg via ORAL
  Filled 2022-04-09 (×8): qty 1

## 2022-04-09 MED ORDER — OXYCODONE-ACETAMINOPHEN 10-325 MG PO TABS: 1.0000 | ORAL_TABLET | Freq: Every day | ORAL | Status: DC | PRN

## 2022-04-09 MED ORDER — FLUTICASONE PROPIONATE 50 MCG/ACT NA SUSP: 1.0000 | Freq: Every day | NASAL | Status: DC | PRN

## 2022-04-09 MED ORDER — TOPIRAMATE 100 MG PO TABS
200.0000 mg | ORAL_TABLET | Freq: Every day | ORAL | Status: DC
  Administered 2022-04-09 – 2022-04-12 (×3): 200 mg via ORAL
  Filled 2022-04-09: qty 8
  Filled 2022-04-09: qty 2
  Filled 2022-04-09: qty 8

## 2022-04-09 MED ORDER — PREGABALIN 100 MG PO CAPS
300.0000 mg | ORAL_CAPSULE | Freq: Three times a day (TID) | ORAL | Status: DC
  Administered 2022-04-09 – 2022-04-12 (×11): 300 mg via ORAL
  Filled 2022-04-09 (×12): qty 3

## 2022-04-09 MED ORDER — FUROSEMIDE 20 MG PO TABS
20.0000 mg | ORAL_TABLET | Freq: Every day | ORAL | Status: DC
  Administered 2022-04-09 – 2022-04-12 (×4): 20 mg via ORAL
  Filled 2022-04-09 (×4): qty 1

## 2022-04-09 MED ORDER — LEVOTHYROXINE SODIUM 50 MCG PO TABS
50.0000 ug | ORAL_TABLET | Freq: Every day | ORAL | Status: DC
  Administered 2022-04-09 – 2022-04-13 (×5): 50 ug via ORAL
  Filled 2022-04-09 (×5): qty 1

## 2022-04-09 MED ORDER — METHYLPREDNISOLONE SODIUM SUCC 125 MG IJ SOLR
125.0000 mg | Freq: Once | INTRAMUSCULAR | Status: AC
  Administered 2022-04-09: 125 mg via INTRAVENOUS
  Filled 2022-04-09: qty 2

## 2022-04-09 MED ORDER — POTASSIUM CHLORIDE CRYS ER 20 MEQ PO TBCR
20.0000 meq | EXTENDED_RELEASE_TABLET | Freq: Every day | ORAL | Status: DC
  Administered 2022-04-09 – 2022-04-10 (×2): 20 meq via ORAL
  Filled 2022-04-09 (×2): qty 1

## 2022-04-09 MED ORDER — CLONAZEPAM 0.5 MG PO TABS
0.5000 mg | ORAL_TABLET | Freq: Every day | ORAL | Status: DC | PRN
  Administered 2022-04-12 (×2): 0.5 mg via ORAL
  Filled 2022-04-09 (×2): qty 1

## 2022-04-09 MED ORDER — ZOLPIDEM TARTRATE 5 MG PO TABS
5.0000 mg | ORAL_TABLET | Freq: Every day | ORAL | Status: DC
  Administered 2022-04-09 – 2022-04-12 (×4): 5 mg via ORAL
  Filled 2022-04-09 (×4): qty 1

## 2022-04-09 MED ORDER — POTASSIUM CHLORIDE CRYS ER 20 MEQ PO TBCR
40.0000 meq | EXTENDED_RELEASE_TABLET | Freq: Once | ORAL | Status: AC
  Administered 2022-04-09: 40 meq via ORAL
  Filled 2022-04-09: qty 2

## 2022-04-09 MED ORDER — APIXABAN 5 MG PO TABS
5.0000 mg | ORAL_TABLET | Freq: Two times a day (BID) | ORAL | Status: DC
  Administered 2022-04-09 – 2022-04-12 (×8): 5 mg via ORAL
  Filled 2022-04-09 (×8): qty 1

## 2022-04-09 MED ORDER — SEMAGLUTIDE (1 MG/DOSE) 4 MG/3ML ~~LOC~~ SOPN: 1.0000 mg | PEN_INJECTOR | SUBCUTANEOUS | Status: DC

## 2022-04-09 NOTE — Progress Notes (Signed)
Patient arrived from ED and admitted to room 2W09 for asthma exacerbation. Patient is alert and oriented x4, on 3 liters on oxygen saturating at 100%. Expiratory wheezing heard in bilateral upper and lower bases, most prominently heard on right side. Patient denies current shortness of breath and states she is tired and wants to rest. Skin assessment completed with Lonn Georgia, RN- no significant findings, skin intact. Patient noted to have an intact IV to left forearm. Patient states last bowel movement was today.

## 2022-04-09 NOTE — ED Provider Notes (Signed)
[facility]ER[facility]ENC[facility] DEPARTMENT Provider Note   CSN: [id number] Arrival date & time: 04/09/22  0600     History  Chief Complaint  Patient presents with   Wheezing         April [name] is a 51 y.o. female.  Patient with history of asthma, anxiety, COPD, GERD, HTN, morbid obesity, OSA, prediabetes who returns today with complaints of shortness of breath. Patient was just discharged 2 days ago for asthma exacerbation. She is on 4-5 L of oxygen at baseline and has not increased her oxygen at home. She states that she was not feeling well at discharge but was hoping that she would continue to improve at home but states she has been feeling worse. She states that she has a history of asthma exacerbation requiring intubation and is concerned that she feels similar to that time. She states that she is having persistent shortness of breath and chest tightness and is unable to get relief on home meds. Denies any leg pain or leg swelling. No fevers or chills.     The history is provided by the patient. No language interpreter was used.  Wheezing Associated symptoms: chest tightness and shortness of breath        Home Medications Prior to Admission medications   Medication Sig Start Date End Date Taking? Authorizing Provider  albuterol (PROAIR HFA) 108 (90 Base) MCG/ACT inhaler Inhale 2 puffs into the lungs every 6 (six) hours as needed for wheezing or shortness of breath. Patient taking differently: Inhale 2 puffs into the lungs as needed for wheezing or shortness of breath. 11/13/21   Baird Lyons D, MD  amLODipine (NORVASC) 10 MG tablet Take 1 tablet (10 mg total) by mouth daily. 03/11/22   [name], MD  apixaban (ELIQUIS) 5 MG TABS tablet Take 1 tablet (5 mg total) by mouth 2 (two) times daily. 11/28/21   [name], MD  carvedilol (COREG) 6.25 MG tablet TAKE 1 TABLET(6.25 MG) BY MOUTH TWICE DAILY WITH A MEAL Patient taking differently: Take 6.25 mg by  mouth daily. 03/18/22   [name], MD  clonazePAM (KLONOPIN) 0.5 MG tablet Take 1 tablet (0.5 mg total) by mouth daily as needed for anxiety. Patient taking differently: Take 0.5 mg by mouth 2 (two) times daily as needed for anxiety. 10/18/21   Florencia Reasons, MD  cloNIDine (CATAPRES) 0.2 MG tablet Take 1 tablet (0.2 mg total) by mouth 2 (two) times daily. 03/11/22   [name], MD  DULoxetine (CYMBALTA) 60 MG capsule Take 60 mg by mouth daily.    [provider]  EPINEPHrine 0.3 mg/0.3 mL IJ SOAJ injection Inject 0.3 mg into the muscle as needed for anaphylaxis. 05/30/20   Deneise Lever, MD  famotidine (PEPCID) 40 MG tablet Take 40 mg by mouth daily. 01/25/22   [provider]  FLUoxetine (PROZAC) 10 MG capsule Take 2 capsules (20 mg total) by mouth daily. Take 1 capsule for first 7 days and then start taking 2 capsules daily. Patient taking differently: Take 10 mg by mouth daily. 02/05/22   Armando Reichert, MD  fluticasone (FLONASE) 50 MCG/ACT nasal spray SHAKE LIQUID AND USE 1 SPRAY IN EACH NOSTRIL DAILY AS NEEDED FOR ALLERGIES OR RHINITIS Patient taking differently: Place 1 spray into both nostrils as needed for allergies. 08/05/21   [name], MD  Fluticasone-Umeclidin-Vilant 100-62.5-25 MCG/ACT AEPB Inhale 1 puff into the lungs daily. 10/26/21   Charlynne Cousins, MD  furosemide (LASIX) 20 MG tablet  Take 1 tablet (20 mg total) by mouth daily. 11/28/21   [name], MD  ipratropium-albuterol (DUONEB) 0.5-2.5 (3) MG/3ML SOLN 1 neb every 6 hours if needed for asthma Patient taking differently: Take 3 mLs by nebulization every 4 (four) hours as needed (shortness of breath). 01/28/22   Deneise Lever, MD  levothyroxine (SYNTHROID) 50 MCG tablet Take 1 tablet (50 mcg total) by mouth daily. 11/28/21   [name], MD  naloxone [facility]) nasal spray 4 mg/0.1 mL Place 1 spray into the nose once as needed (opioid overdose). 08/09/20   [provider]   oxyCODONE-acetaminophen (PERCOCET) 10-325 MG tablet Take 1 tablet by mouth 5 (five) times daily as needed for pain. 03/26/22     phentermine 15 MG capsule Take 15 mg by mouth daily. 08/10/21   [provider]  potassium chloride SA (KLOR-CON M) 20 MEQ tablet TAKE 1 TABLET(20 MEQ) BY MOUTH DAILY Patient taking differently: Take 20 mEq by mouth daily. 02/01/22   [name], MD  predniSONE (DELTASONE) 20 MG tablet Take 2 tablets (40 mg total) by mouth daily with breakfast for 5 days. 04/07/22 04/12/22  Dwyane Dee, MD  pregabalin (LYRICA) 300 MG capsule Take 300 mg by mouth in the morning, at noon, and at bedtime. 01/08/21   [provider]  Semaglutide, 1 MG/DOSE, (OZEMPIC, 1 MG/DOSE,) 4 MG/3ML SOPN Inject 1 mg into the skin once a week.    [provider]  Tezepelumab-ekko (TEZSPIRE) 210 MG/1.91ML SOAJ Inject 210 mg into the skin every 28 (twenty-eight) days. 11/05/21   Baird Lyons D, MD  topiramate (TOPAMAX) 200 MG tablet Take 200 mg by mouth at bedtime. 01/08/21   [provider]  Vitamin D, Ergocalciferol, (DRISDOL) 1.25 MG (50000 UNIT) CAPS capsule Take 50,000 Units by mouth once a week. 11/30/21   [provider]  zolpidem (AMBIEN) 10 MG tablet TAKE 1 TABLET BY MOUTH AT BEDTIME AS NEEDED FOR SLEEP Patient taking differently: Take 10 mg by mouth at bedtime. 01/28/22   Young, Kasandra Knudsen, MD  mometasone-formoterol (DULERA) 200-5 MCG/ACT AERO Inhale 2 puffs into the lungs 2 (two) times daily. Dx: Asthma 04/08/19 08/12/19  Deneise Lever, MD      Allergies    Contrast media [iodinated contrast media]    Review of Systems   Review of Systems  Respiratory:  Positive for chest tightness, shortness of breath and wheezing.   All other systems reviewed and are negative.   Physical Exam Updated Vital Signs BP (!) 122/92 (BP Location: Right Wrist)   Pulse (!) 107   Temp 97.7 F (36.5 C) (Oral)   Resp 20   LMP 03/14/2022 (Approximate)   SpO2 98%   Physical Exam Vitals and nursing note reviewed.  Constitutional:      General: She is not in acute distress.    Appearance: Normal appearance. She is normal weight. She is not ill-appearing, toxic-appearing or diaphoretic.  HENT:     Head: Normocephalic and atraumatic.  Eyes:     Extraocular Movements: Extraocular movements intact.     Pupils: Pupils are equal, round, and reactive to light.  Cardiovascular:     Rate and Rhythm: Normal rate and regular rhythm.     Heart sounds: Normal heart sounds.  Pulmonary:     Effort: No respiratory distress.     Breath sounds: Wheezing present.  Abdominal:     General: Abdomen is flat.     Palpations: Abdomen is soft.  Musculoskeletal:  General: No tenderness. Normal range of motion.     Cervical back: Normal range of motion.     Right lower leg: No edema.     Left lower leg: No edema.  Skin:    General: Skin is warm and dry.  Neurological:     General: No focal deficit present.     Mental Status: She is alert.  Psychiatric:        Mood and Affect: Mood normal.        Behavior: Behavior normal.     ED Results / Procedures / Treatments   Labs (all labs ordered are listed, but only abnormal results are displayed) Labs Reviewed  CBC - Abnormal; Notable for the following components:      Result Value   WBC 19.4 (*)    MCH 25.7 (*)    RDW 19.0 (*)    Platelets 479 (*)    All other components within normal limits  BASIC METABOLIC PANEL - Abnormal; Notable for the following components:   Potassium 3.2 (*)    Glucose, Bld 122 (*)    Creatinine, Ser 1.08 (*)    Calcium 8.2 (*)    All other components within normal limits  I-STAT VENOUS BLOOD GAS, ED - Abnormal; Notable for the following components:   pH, Ven 7.219 (*)    Acid-base deficit 7.0 (*)    Potassium 3.3 (*)    All other components within normal limits    EKG None  Radiology DG Chest 2 View  Result Date: 04/09/2022 CLINICAL DATA:  51 year old female with  history of wheezing, cough and shortness of breath for the past 2 weeks. EXAM: CHEST - 2 VIEW COMPARISON:  Chest x-ray 04/04/2022. FINDINGS: Lung volumes are low. No consolidative airspace disease. No pleural effusions. No pneumothorax. No pulmonary nodule or mass noted. Pulmonary vasculature and the cardiomediastinal silhouette are within normal limits. IMPRESSION: 1. Low lung volumes without radiographic evidence of acute cardiopulmonary disease. Electronically Signed   By: Vinnie Langton M.D.   On: 04/09/2022 07:39    Procedures .Critical Care  Performed by: Bud Face, PA-C Authorized by: Bud Face, PA-C   Critical care provider statement:    Critical care time (minutes):  35   Critical care start time:  04/09/2022 7:00 AM   Critical care end time:  04/09/2022 7:35 AM   Critical care was necessary to treat or prevent imminent or life-threatening deterioration of the following conditions:  Respiratory failure   Critical care was time spent personally by me on the following activities:  Development of treatment plan with patient or surrogate, discussions with consultants, discussions with primary provider, evaluation of patient's response to treatment, obtaining history from patient or surrogate, ordering and review of laboratory studies, ordering and review of radiographic studies, pulse oximetry, re-evaluation of patient's condition and review of old charts   Care discussed with: admitting provider       Medications Ordered in ED Medications  potassium chloride SA (KLOR-CON M) CR tablet 40 mEq (has no administration in time range)  ipratropium-albuterol (DUONEB) 0.5-2.5 (3) MG/3ML nebulizer solution 3 mL (3 mLs Nebulization Given 04/09/22 0738)  methylPREDNISolone sodium succinate (SOLU-MEDROL) 125 mg/2 mL injection 125 mg (125 mg Intravenous Given 04/09/22 0736)  magnesium sulfate IVPB 2 g 50 mL (0 g Intravenous Stopped 04/09/22 0846)  ipratropium-albuterol (DUONEB) 0.5-2.5 (3)  MG/3ML nebulizer solution 3 mL (3 mLs Nebulization Given 04/09/22 2633)    ED Course/ Medical Decision Making/ A&P  Medical Decision Making Amount and/or Complexity of Data Reviewed Labs: ordered. Radiology: ordered.  Risk Prescription drug management.   This patient is a 51 y.o. female who presents to the ED for concern of shortness of breath, this involves an extensive number of treatment options, and is a complaint that carries with it a high risk of complications and morbidity. The emergent differential diagnosis prior to evaluation includes, but is not limited to,  CHF, pericardial effusion/tamponade, arrhythmias, ACS, COPD, asthma, bronchitis, pneumonia, pneumothorax, PE, anemia   This is not an exhaustive differential.   Past Medical History / Co-morbidities / Social History: Hx asthma, anxiety, COPD, htn, OSA. On 4-5L [place] at baseline  Additional history: Chart reviewed. Pertinent results include: patient just discharged for asthma exacerbation 2 days ago  Physical Exam: Physical exam performed. The pertinent findings include: audible wheezing, patient satting greater than 90% on 4 L  Lab Tests: I ordered, and personally interpreted labs.  The pertinent results include:  WBC 19.4 likely due to recent steroid use. K 3.2 likely from increased albuterol use. VBG pH 7.219. No other acute laboratory findings   Imaging Studies: I ordered imaging studies including CXR. I independently visualized and interpreted imaging which showed   1. Low lung volumes without radiographic evidence of acute cardiopulmonary disease.  I agree with the radiologist interpretation.   Cardiac Monitoring:  The patient was maintained on a cardiac monitor.  My attending physician Dr. Mayra Neer viewed and interpreted the cardiac monitored which showed an underlying rhythm of: no STEMI. I agree with this interpretation.   Medications: I ordered medication including duonebs,  steroids, and mag  for wheezing and shortness of breath. Reevaluation of the patient after these medicines showed that the patient improved. I have reviewed the patients home medicines and have made adjustments as needed.    Disposition:  Patient presents today with complaints of wheezing and shortness of breath.  Patient was just discharged 2 days ago for asthma exacerbation.  States that she did not feel better when she was discharged and is continued to worsen since discharge.  Does not feel able to manage her symptoms with home meds and oxygen.  After several rounds of DuoNebs with steroids and magnesium, patient still has increased work of breathing and wheezing which she states is not her baseline.  Given this, patient will need readmission.  Patient is understanding and amenable with plan.  Discussed patient with hospitalist who agrees to admit.  This is a shared visit with supervising physician Dr. Mayra Neer who has independently evaluated patient & provided guidance in evaluation/management/disposition, in agreement with care     Final Clinical Impression(s) / ED Diagnoses Final diagnoses:  Severe persistent asthma with exacerbation    Rx / DC Orders ED Discharge Orders     None         Bud Face, PA-C 04/09/22 1116    Audley Hose, MD 04/10/22 1119

## 2022-04-09 NOTE — Telephone Encounter (Signed)
Received fax from [facility] requesting a refill for the following medications. Please advise.       Disp Refills Start End   FLUoxetine (PROZAC) 10 MG capsule 60 capsule 1 02/05/2022    Sig - Route: Take 2 capsules (20 mg total) by mouth daily. Take 1 capsule for first 7 days and then start taking 2 capsules daily. - Oral   Patient taking differently: Take 10 mg by mouth daily.       Sent to pharmacy as: FLUoxetine (PROZAC) 10 MG capsule   E-Prescribing Status: Receipt confirmed by pharmacy (02/05/2022  2:08 PM EDT)

## 2022-04-09 NOTE — ED Triage Notes (Signed)
Pt reports feeling tight and having chest pain. Recently released from hospital for asthma exacerbation. Audible wheezing in triage. Tacypnec and having distress when talking.

## 2022-04-09 NOTE — ED Notes (Signed)
Monitor alerts reported to RN

## 2022-04-09 NOTE — Telephone Encounter (Signed)
Transition Care Management Follow-up Telephone Call Date of discharge and from where: 04/07/2022, [facility] Currently in ED.

## 2022-04-09 NOTE — H&P (Addendum)
History and Physical    April [name] WCB:[id number] DOB: 28-Nov-1970 DOA: 04/09/2022  PCP: [name], MD (Confirm with patient/family/NH records and if not entered, this has to be entered at [facility] point of entry) Patient coming from: Home  I have personally briefly reviewed patient's old medical records in [place]  Chief Complaint: Wheezing, shortness of breath  HPI: April [name] is a 51 y.o. female with medical history significant of severe asthma/COPD, HTN, anxiety/depression, HTN, morbid obesity, OSA on CPAP, RA, presented with increasing wheezing and shortness of breath.  Patient was recently hospitalized for COPD exacerbation, was discharged 2 days ago.  Since yesterday, patient has experienced worsening of wheezing and shortness of breath, no cough no chest pain no fever chills.  She has tried around-the-clock breathing treatment with no significant improvement.  She cannot identify a clear trigger for her flareup although she remembered that she might exposed to family members smoking yesterday but she was not completely sure.  ED Course: Patient was found to be in significant respiratory distress, wheezing, which continued even after several rounds of breathing treatment IV Solu-Medrol and IV magnesium.  Chest x-ray showed no acute infiltrates, ABG showed acute acidosis 7.21/52/42  Review of Systems: As per HPI otherwise 14 point review of systems negative.    Past Medical History:  Diagnosis Date   Acanthosis nigricans    Anxiety    Arthritis    "knees" (04/28/2017)   Asthma    Followed by Dr. Annamaria Boots (pulmonology); receives every other week omalizumab injections; has frequent exacerbations   Back pain    Chronic diastolic CHF (congestive heart failure) ([place]) 01/17/2017   COPD (chronic obstructive pulmonary disease) ([place])    PFTs in 2002, FEV1/FVC 65, no post bronchodilater test done   Depression    GERD (gastroesophageal reflux disease)    Headache(784.0)    "q  couple days" (61/60/7371)   Helicobacter pylori (H. pylori) infection    Hypertension, essential    Insomnia    Joint pain    Lower extremity edema    Menorrhagia    Morbid obesity ([place])    OSA on CPAP    Sleep study 2008 - mild OSA, not enough events to titrate CPAP; wears CPAP now/pt on 04/28/2017   Pneumonia X 1   Prediabetes    Rheumatoid arthritis (HCC)    Seasonal allergies    Shortness of breath    Tobacco user    Vitamin D deficiency     Past Surgical History:  Procedure Laterality Date   CARDIOVERSION N/A 05/30/2017   Procedure: CARDIOVERSION;  Surgeon: Sanda Klein, MD;  Location: [place];  Service: Cardiovascular;  Laterality: N/A;   REDUCTION MAMMAPLASTY Bilateral 09/2011   TUBAL LIGATION  1996   bilateral     reports that she quit smoking about 7 years ago. Her smoking use included cigarettes. She has a 13.00 pack-year smoking history. She has never used smokeless tobacco. She reports that she does not drink alcohol and does not use drugs.  Allergies  Allergen Reactions   Contrast Media [Iodinated Contrast Media] Itching    CT contrast. Unknown per PT.     Family History  Problem Relation Age of Onset   Hypertension Mother    Asthma Daughter    Cancer Paternal Aunt    Asthma Maternal Grandmother      Prior to Admission medications   Medication Sig Start Date End Date Taking? Authorizing Provider  albuterol [facility] HFA) 108 (90 Base)  MCG/ACT inhaler Inhale 2 puffs into the lungs every 6 (six) hours as needed for wheezing or shortness of breath. Patient taking differently: Inhale 2 puffs into the lungs as needed for wheezing or shortness of breath. 11/13/21  Yes Young, Tarri Fuller D, MD  amLODipine (NORVASC) 10 MG tablet Take 1 tablet (10 mg total) by mouth daily. 03/11/22  Yes [name], MD  apixaban (ELIQUIS) 5 MG TABS tablet Take 1 tablet (5 mg total) by mouth 2 (two) times daily. 11/28/21  Yes Newlin, Charlane Ferretti, MD  carvedilol (COREG) 6.25 MG  tablet TAKE 1 TABLET(6.25 MG) BY MOUTH TWICE DAILY WITH A MEAL Patient taking differently: Take 6.25 mg by mouth daily. 03/18/22  Yes [name], MD  clonazePAM (KLONOPIN) 0.5 MG tablet Take 1 tablet (0.5 mg total) by mouth daily as needed for anxiety. Patient taking differently: Take 0.5 mg by mouth 2 (two) times daily as needed for anxiety. 10/18/21  Yes Florencia Reasons, MD  cloNIDine (CATAPRES) 0.2 MG tablet Take 1 tablet (0.2 mg total) by mouth 2 (two) times daily. 03/11/22  Yes Newlin, Charlane Ferretti, MD  EPINEPHrine 0.3 mg/0.3 mL IJ SOAJ injection Inject 0.3 mg into the muscle as needed for anaphylaxis. 05/30/20  Yes Young, Tarri Fuller D, MD  famotidine (PEPCID) 40 MG tablet Take 40 mg by mouth daily. 01/25/22  Yes [provider]  fluticasone (FLONASE) 50 MCG/ACT nasal spray SHAKE LIQUID AND USE 1 SPRAY IN EACH NOSTRIL DAILY AS NEEDED FOR ALLERGIES OR RHINITIS Patient taking differently: Place 1 spray into both nostrils as needed for allergies. 08/05/21  Yes [name], MD  Fluticasone-Umeclidin-Vilant 100-62.5-25 MCG/ACT AEPB Inhale 1 puff into the lungs daily. 10/26/21  Yes Charlynne Cousins, MD  furosemide (LASIX) 20 MG tablet Take 1 tablet (20 mg total) by mouth daily. 11/28/21  Yes Newlin, Charlane Ferretti, MD  ipratropium-albuterol (DUONEB) 0.5-2.5 (3) MG/3ML SOLN 1 neb every 6 hours if needed for asthma Patient taking differently: Take 3 mLs by nebulization every 4 (four) hours as needed (shortness of breath). 01/28/22  Yes Young, Tarri Fuller D, MD  levothyroxine (SYNTHROID) 50 MCG tablet Take 1 tablet (50 mcg total) by mouth daily. 11/28/21  Yes [name], MD  naloxone (NARCAN) nasal spray 4 mg/0.1 mL Place 1 spray into the nose once as needed (opioid overdose). 08/09/20  Yes [provider]  oxyCODONE-acetaminophen (PERCOCET) 10-325 MG tablet Take 1 tablet by mouth 5 (five) times daily as needed for pain. 03/26/22  Yes   phentermine 15 MG capsule Take 15 mg by mouth daily. 08/10/21  Yes  [provider]  potassium chloride SA (KLOR-CON M) 20 MEQ tablet TAKE 1 TABLET(20 MEQ) BY MOUTH DAILY Patient taking differently: Take 20 mEq by mouth daily. 02/01/22  Yes [name], MD  predniSONE (DELTASONE) 20 MG tablet Take 2 tablets (40 mg total) by mouth daily with breakfast for 5 days. 04/07/22 04/12/22 Yes Dwyane Dee, MD  pregabalin (LYRICA) 300 MG capsule Take 300 mg by mouth in the morning, at noon, and at bedtime. 01/08/21  Yes [provider]  Semaglutide, 1 MG/DOSE, (OZEMPIC, 1 MG/DOSE,) 4 MG/3ML SOPN Inject 1 mg into the skin once a week.   Yes [provider]  Tezepelumab-ekko (TEZSPIRE) 210 MG/1.91ML SOAJ Inject 210 mg into the skin every 28 (twenty-eight) days. 11/05/21  Yes Young, Tarri Fuller D, MD  Vitamin D, Ergocalciferol, (DRISDOL) 1.25 MG (50000 UNIT) CAPS capsule Take 50,000 Units by mouth once a week. 11/30/21  Yes [provider]  zolpidem (AMBIEN) 10 MG  tablet TAKE 1 TABLET BY MOUTH AT BEDTIME AS NEEDED FOR SLEEP Patient taking differently: Take 10 mg by mouth at bedtime. 01/28/22  Yes Young, Tarri Fuller D, MD  FLUoxetine (PROZAC) 20 MG capsule Take 1 capsule (20 mg total) by mouth daily. Take 1 capsule daily. 04/09/22   Armando Reichert, MD  topiramate (TOPAMAX) 200 MG tablet Take 200 mg by mouth at bedtime. 01/08/21   [provider]  mometasone-formoterol (DULERA) 200-5 MCG/ACT AERO Inhale 2 puffs into the lungs 2 (two) times daily. Dx: Asthma 04/08/19 08/12/19  Baird Lyons D, MD    Physical Exam: Vitals:   04/09/22 0915 04/09/22 1000 04/09/22 1001 04/09/22 1115  BP: (!) 165/99 (!) 135/123  124/71  Pulse: 74 72  100  Resp: '18 14  18  '$ Temp:   98.1 F (36.7 C)   TempSrc:   Oral   SpO2: 100% 100%  93%    Constitutional: NAD, calm, comfortable Vitals:   04/09/22 0915 04/09/22 1000 04/09/22 1001 04/09/22 1115  BP: (!) 165/99 (!) 135/123  124/71  Pulse: 74 72  100  Resp: '18 14  18  '$ Temp:   98.1 F (36.7 C)   TempSrc:    Oral   SpO2: 100% 100%  93%   Eyes: PERRL, lids and conjunctivae normal ENMT: Mucous membranes are moist. Posterior pharynx clear of any exudate or lesions.Normal dentition.  Neck: normal, supple, no masses, no thyromegaly Respiratory: Diminished breathing sound bilaterally, scattered crackles, diffused wheezing, increasing breathing effort, No accessory muscle use.  Cardiovascular: Regular rate and rhythm, no murmurs / rubs / gallops. No extremity edema. 2+ pedal pulses. No carotid bruits.  Abdomen: no tenderness, no masses palpated. No hepatosplenomegaly. Bowel sounds positive.  Musculoskeletal: no clubbing / cyanosis. No joint deformity upper and lower extremities. Good ROM, no contractures. Normal muscle tone.  Skin: no rashes, lesions, ulcers. No induration Neurologic: CN 2-12 grossly intact. Sensation intact, DTR normal. Strength 5/5 in all 4.  Psychiatric: Normal judgment and insight. Alert and oriented x 3. Normal mood.     Labs on Admission: I have personally reviewed following labs and imaging studies  CBC: Recent Labs  Lab 04/04/22 0535 04/05/22 0409 04/06/22 0406 04/09/22 0724 04/09/22 0809  WBC 19.3* 16.9* 16.8* 19.4*  --   NEUTROABS 10.1*  --  9.0*  --   --   HGB 11.4* 11.5* 11.5* 12.4 13.6  HCT 37.3 38.4 37.1 39.5 40.0  MCV 82.5 84.4 83.2 82.0  --   PLT 362 331 384 479*  --    Basic Metabolic Panel: Recent Labs  Lab 04/04/22 0535 04/04/22 0800 04/05/22 0409 04/06/22 0406 04/09/22 0724 04/09/22 0809  NA 141  --  135 137 141 141  K 2.9*  --  4.0 3.9 3.2* 3.3*  CL 110  --  109 109 108  --   CO2 21*  --  16* 22 22  --   GLUCOSE 92  --  93 82 122*  --   BUN 11  --  '12 17 16  '$ --   CREATININE 0.94  --  0.88 0.82 1.08*  --   CALCIUM 8.4*  --  8.4* 8.6* 8.2*  --   MG  --  2.0  --  2.1  --   --    GFR: Estimated Creatinine Clearance: 92.9 mL/min (A) (by C-G formula based on SCr of 1.08 mg/dL (H)). Liver Function Tests: Recent Labs  Lab 04/05/22 0409  AST  19  ALT 14  ALKPHOS 64  BILITOT 0.4  PROT 7.2  ALBUMIN 3.4*   No results for input(s): "LIPASE", "AMYLASE" in the last 168 hours. No results for input(s): "AMMONIA" in the last 168 hours. Coagulation Profile: No results for input(s): "INR", "PROTIME" in the last 168 hours. Cardiac Enzymes: No results for input(s): "CKTOTAL", "CKMB", "CKMBINDEX", "TROPONINI" in the last 168 hours. BNP (last 3 results) No results for input(s): "PROBNP" in the last 8760 hours. HbA1C: No results for input(s): "HGBA1C" in the last 72 hours. CBG: No results for input(s): "GLUCAP" in the last 168 hours. Lipid Profile: No results for input(s): "CHOL", "HDL", "LDLCALC", "TRIG", "CHOLHDL", "LDLDIRECT" in the last 72 hours. Thyroid Function Tests: No results for input(s): "TSH", "T4TOTAL", "FREET4", "T3FREE", "THYROIDAB" in the last 72 hours. Anemia Panel: No results for input(s): "VITAMINB12", "FOLATE", "FERRITIN", "TIBC", "IRON", "RETICCTPCT" in the last 72 hours. Urine analysis:    Component Value Date/Time   COLORURINE YELLOW 10/23/2021 1826   APPEARANCEUR CLOUDY (A) 10/23/2021 1826   LABSPEC 1.027 10/23/2021 1826   PHURINE 5.0 10/23/2021 1826   GLUCOSEU NEGATIVE 10/23/2021 1826   GLUCOSEU NEG mg/dL 10/28/2007 2049   HGBUR NEGATIVE 10/23/2021 1826   BILIRUBINUR NEGATIVE 10/23/2021 1826   KETONESUR NEGATIVE 10/23/2021 1826   PROTEINUR NEGATIVE 10/23/2021 1826   UROBILINOGEN 1.0 11/21/2014 0707   NITRITE NEGATIVE 10/23/2021 1826   LEUKOCYTESUR LARGE (A) 10/23/2021 1826    Radiological Exams on Admission: DG Chest 2 View  Result Date: 04/09/2022 CLINICAL DATA:  51 year old female with history of wheezing, cough and shortness of breath for the past 2 weeks. EXAM: CHEST - 2 VIEW COMPARISON:  Chest x-ray 04/04/2022. FINDINGS: Lung volumes are low. No consolidative airspace disease. No pleural effusions. No pneumothorax. No pulmonary nodule or mass noted. Pulmonary vasculature and the  cardiomediastinal silhouette are within normal limits. IMPRESSION: 1. Low lung volumes without radiographic evidence of acute cardiopulmonary disease. Electronically Signed   By: Vinnie Langton M.D.   On: 04/09/2022 07:39    EKG: Independently reviewed.  Sinus, no acute ST changes.  Assessment/Plan Principal Problem:   COPD (chronic obstructive pulmonary disease) (HCC) Active Problems:   Acute respiratory failure (HCC)   Acute respiratory failure with hypoxia (HCC)   Chronic atrial fibrillation   Acute asthma exacerbation   Acute exacerbation of chronic obstructive pulmonary disease (COPD) ([place])  (please populate well all problems here in Problem List. (For example, if patient is on BP meds at home and you resume or decide to hold them, it is a problem that needs to be her. Same for CAD, COPD, HLD and so on)  Acute asthma/COPD exacerbation -Frequent flareup since this summer, with unclear triggers.  It was suspected on several occasions that secondhand smoking might be the trigger.  Patient also admitted she has uncontrolled GERD, will add PPI. -Her ABG showed probably acute uncompensated respiratory acidosis with probably a quick correction of pCO2. -She has a CPAP at home but probably not compatible with oxygen.  Recommend she go to talk to her pulmonary about possible escalate her CPAP machine to oxygen compatible to treat mild asthma/COPD exacerbation in the future -Continue steroids, inhaled steroid and DuoNeb plus as needed albuterol. -As this is the second exacerbation within 1 month, will also add antibiotics of doxycycline. -Incentive spirometry. -Titrate O2 saturation 92-93%.  Acute hypoxic and probably hypercapnic respiratory failure -As above  HTN -Continue amlodipine and Coreg  PAF -Sinus, continue Eliquis  OSA -CPAP HS  Chronic HFpEF -Euvolemic, blood pressure control  Anxiety/depression -  Continue clonazepam, Prozac  -She told me that her psychiatrist  discontinued her Cymbalta this week  Morbid obesity -BMI= 51 -She is not interested in calorie control and demands regular diet.    DVT prophylaxis: Eliquis Code Status: Full code Family Communication: None at bedside Disposition Plan: Patient is sick with recurrent COPD exacerbation and respiratory failure Consults called: None Admission status: MedSurg admit   Lequita Halt MD [facility] Pager [phone]  04/09/2022, 12:01 PM

## 2022-04-09 NOTE — Progress Notes (Signed)
Pt states she can not tolerated our cpap mask and does not want to wear cpap at this time.

## 2022-04-09 NOTE — Telephone Encounter (Signed)
Got the refill request from pharmacy for Prozac 20 mg daily. I see that Pt has been refilling Cymbalta also. At last visit, Pt was instructed to taper and stop cymbalta and start Prozac 10 mg daily and increase it to 20 mg daily after 1 week.   Called patient. She is currently in the hospital for medical reason. She reports that she has not stopped Cymbalta and has started Prozac 10 mg daily.  She has been to taking both medications.  Discussed that at last visit, we discussed about tapering and stopping Cymbalta and starting Prozac. Discussed that she should not take both Cymbalta and Prozac together and she can discontinue Cymbalta and increase Prozac to 20 mg daily.  She verbalizes understanding.  She is recommended to make follow-up appointment after she gets discharged.  Plan-  Discontinue Cymbalta.  Increase Prozac to 20 mg daily. 30 days Rx with 1 refill sent to pt's pharmacy.  Make a follow-up appointment for med management.  Armando Reichert, MD PGY3 Psychiatry Resident  [facility]

## 2022-04-10 ENCOUNTER — Ambulatory Visit (HOSPITAL_COMMUNITY): Payer: Medicare Other | Admitting: Psychiatry

## 2022-04-10 DIAGNOSIS — I482 Chronic atrial fibrillation, unspecified: Secondary | ICD-10-CM

## 2022-04-10 DIAGNOSIS — J4551 Severe persistent asthma with (acute) exacerbation: Secondary | ICD-10-CM

## 2022-04-10 MED ORDER — POTASSIUM CHLORIDE CRYS ER 20 MEQ PO TBCR
40.0000 meq | EXTENDED_RELEASE_TABLET | Freq: Two times a day (BID) | ORAL | Status: AC
  Administered 2022-04-10 – 2022-04-11 (×2): 40 meq via ORAL
  Filled 2022-04-10 (×2): qty 2

## 2022-04-10 MED ORDER — ALBUTEROL SULFATE HFA 108 (90 BASE) MCG/ACT IN AERS: 2.0000 | INHALATION_SPRAY | RESPIRATORY_TRACT | Status: DC | PRN

## 2022-04-10 MED ORDER — IPRATROPIUM-ALBUTEROL 0.5-2.5 (3) MG/3ML IN SOLN
3.0000 mL | RESPIRATORY_TRACT | Status: DC
  Administered 2022-04-10 – 2022-04-13 (×14): 3 mL via RESPIRATORY_TRACT
  Filled 2022-04-10 (×15): qty 3

## 2022-04-10 MED ORDER — ALBUTEROL SULFATE (2.5 MG/3ML) 0.083% IN NEBU
2.5000 mg | INHALATION_SOLUTION | RESPIRATORY_TRACT | Status: DC
  Administered 2022-04-10 (×2): 2.5 mg via RESPIRATORY_TRACT
  Filled 2022-04-10 (×2): qty 3

## 2022-04-10 MED ORDER — ALBUTEROL SULFATE (2.5 MG/3ML) 0.083% IN NEBU: 2.5000 mg | INHALATION_SOLUTION | RESPIRATORY_TRACT | Status: DC | PRN

## 2022-04-10 MED ORDER — POLYETHYLENE GLYCOL 3350 17 G PO PACK
34.0000 g | PACK | Freq: Every day | ORAL | Status: DC
  Administered 2022-04-10 – 2022-04-11 (×2): 34 g via ORAL
  Filled 2022-04-10 (×2): qty 2

## 2022-04-10 NOTE — Progress Notes (Signed)
Mobility Specialist Progress Note:   04/10/22 1027  Mobility  Activity Ambulated independently in hallway  Level of Assistance Modified independent, requires aide device or extra time  Assistive Device None  Distance Ambulated (ft) 80 ft  Activity Response Tolerated fair  Mobility Referral Yes  $Mobility charge 1 Mobility   Post-mobility: 94% SpO2 on 3.5L O2  Pt received in bed and agreeable. C/o wheezing during and after ambulation. No complaints of SOB or pain. Pt left in bed with all needs met and call bell in reach.   Andrey Campanile Mobility Specialist Please contact via SecureChat or  Rehab office at [phone]

## 2022-04-10 NOTE — Progress Notes (Signed)
[facility] PROGRESS NOTE    Progress Note  April [name]  EVO:[id number] DOB: April 26, 1971 DOA: 04/09/2022 PCP: [name], MD     Brief Narrative:   April [name] is an 51 y.o. female past medical history significant for severe asthma/COPD, essential hypertension, morbid obesity obstructive sleep apnea on CPAP rheumatoid arthritis, recently discharged from the hospital 2 days prior to admission for COPD exacerbation comes in that worsen after discharge.  She cannot identify a clear trigger for her flareup except for family members smoking the day prior to admission    Assessment/Plan:   Acute respiratory failure with hypoxia and hypercapnia probably secondary to acute asthma/COPD exacerbation: Frequent flares, with unclear triggers as per patient suspect smoking might be the culprit on this admission. She was started on steroids, and inhalers. She was started empirically on antibiotics as this is her second exacerbation in 1 month. Still requiring 3 L of oxygen to keep saturations greater 96%.  Essential hypertension: Continue midodrine and Coreg.  Paroxysmal atrial fibrillation: Currently in sinus rhythm continue Eliquis and Coreg.  Obstructive sleep apnea: Continue CPAP at night.  Chronic diastolic heart failure: Blood pressure is well-controlled patient appears euvolemic.  Anxiety/depression: Continue Prozac and clonazepam.  Her psychiatrist discontinued Cymbalta.  Morbid obesity: Noted.  DVT prophylaxis: lovenox Family Communication:none Status is: Inpatient Remains inpatient appropriate because: Acute respiratory failure with hypoxia    Code Status:     Code Status Orders  (From admission, onward)           Start     Ordered   04/09/22 1144  Full code  Continuous        04/09/22 1143           Code Status History     Date Active Date Inactive Code Status Order ID Comments User Context   04/04/2022 1408 04/07/2022 2101 Full Code  [id number]  Jonnie Finner, DO ED   01/08/2022 0350 01/13/2022 1652 Full Code [id number]  [name], MD ED   10/24/2021 0742 10/26/2021 1605 Full Code [id number]  Shela Leff, MD Inpatient   10/09/2021 0301 10/18/2021 1610 Full Code [id number]  Toy Baker, MD Inpatient   08/26/2021 2234 08/30/2021 1719 Full Code [id number]  Shela Leff, MD Inpatient   08/23/2021 2319 08/24/2021 0854 Full Code [id number]  Lenore Cordia, MD ED   05/23/2021 1744 05/26/2021 1730 Full Code [id number]  Terrilee Croak, MD ED   02/03/2021 2258 02/07/2021 1554 Full Code [id number]  Marcelyn Bruins, MD ED   02/20/2019 0743 02/20/2019 1648 Full Code [id number]  Shela Leff, MD ED   02/14/2019 0453 02/14/2019 1237 Full Code [id number]  [name], DO ED   10/26/2018 0456 10/27/2018 1806 Full Code [id number]  [name], MD Inpatient   08/02/2018 2303 08/04/2018 1532 Full Code [id number]  [name], MD ED   05/16/2018 0548 05/20/2018 1739 Full Code [id number]  [name], MD ED   10/08/2017 1637 10/15/2017 1722 Full Code [id number]  [name], MD ED   08/07/2017 0405 08/18/2017 2018 Full Code [id number]  Merton Border, MD ED   07/29/2017 0627 08/06/2017 1524 Full Code [id number]  [name], MD ED   07/07/2017 0717 07/12/2017 2008 Full Code [id number]  Rondel Jumbo, PA-C ED   06/06/2017 0836 06/08/2017 2107 Full Code [id number]  [name], MD ED   05/26/2017 0617 06/01/2017 1653 Full Code [id number]  [name], MD ED   04/27/2017 1623 04/30/2017 1742 Full Code  [id number]  Rondel Jumbo, PA-C ED   02/15/2017 0226 02/16/2017 1806 Full Code [id number]  [name], MD Inpatient   02/11/2017 0038 02/14/2017 0537 Full Code [id number]  [name], MD ED   01/27/2017 1024 02/01/2017 1751 Full Code [id number]  Jani Gravel, MD ED   01/17/2017 0514 01/17/2017 2021 Full Code [id number]  [name], MD ED   01/01/2017 0818 01/02/2017 1920 Full Code [id number]  [name], NP ED   12/21/2016  2015 12/22/2016 1723 Full Code [id number]  [name], MD ED   12/10/2016 1852 12/13/2016 1229 Full Code [id number]  [name], MD ED   11/13/2016 2017 11/15/2016 1758 Full Code [id number]  [name], MD ED   10/19/2016 0204 10/20/2016 1755 Full Code [id number]  [name], MD ED   10/02/2016 0553 10/04/2016 2204 Full Code [id number]  [name], MD Inpatient   06/22/2016 2104 06/25/2016 1707 Full Code [id number]  [name], DO ED   05/05/2016 2044 05/07/2016 1601 Full Code [id number]  [name], MD Inpatient   01/30/2016 1937 01/31/2016 1801 Full Code [id number]  [name], MD Inpatient   12/19/2015 0640 12/21/2015 1851 Full Code [id number]  [name], MD Inpatient   11/27/2015 0019 11/27/2015 2031 Full Code [id number]  [name], MD ED   11/10/2015 1408 11/12/2015 1641 Full Code [id number]  [name], MD ED   10/24/2015 0933 10/26/2015 1626 Full Code [id number]  [name], MD ED   08/22/2015 2052 08/24/2015 1700 Full Code [id number]  [name], MD ED   08/14/2015 1624 08/15/2015 1644 Full Code [id number]  [name], MD ED   07/12/2015 1429 07/13/2015 0015 Full Code [id number]  [name], MD Inpatient   05/23/2015 1650 05/24/2015 1819 Full Code [id number]  [name], MD ED   03/13/2015 1143 03/14/2015 1742 Full Code [id number]  [name], MD Inpatient   11/03/2014 0444 11/03/2014 1823 Full Code [id number]  [name], MD Inpatient   09/15/2014 1326 09/16/2014 1658 Full Code [id number]  [name] Inpatient   09/08/2014 1401 09/09/2014 1752 Full Code [id number]  [name], MD Inpatient   03/27/2014 2111 03/28/2014 1754 Full Code [id number]  [name], MD Inpatient   11/23/2013 1717 11/24/2013 0009 Full Code [id number]  Clinton Gallant, MD Inpatient   06/06/2013 2326 06/07/2013 1912 Full Code [id number]  [name], MD Inpatient   11/02/2012 1854 11/03/2012 1909 Full Code [id number]  [name], MD Inpatient   10/21/2012 1030  10/23/2012 1730 Full Code [id number]  [name], MD Inpatient   10/06/2012 2349 10/07/2012 0657 Full Code [id number]  [name], MD Inpatient   05/22/2012 2200 05/23/2012 1627 Full Code [id number]  [name], RN Inpatient   02/27/2012 0219 02/27/2012 1728 Full Code [id number]  [name], MD Inpatient         IV Access:   Peripheral IV   Procedures and diagnostic studies:   DG Chest 2 View  Result Date: 04/09/2022 CLINICAL DATA:  51 year old female with history of wheezing, cough and shortness of breath for the past 2 weeks. EXAM: CHEST - 2 VIEW COMPARISON:  Chest x-ray 04/04/2022. FINDINGS: Lung volumes are low. No consolidative airspace disease. No pleural effusions. No pneumothorax. No pulmonary nodule or mass noted. Pulmonary vasculature and the cardiomediastinal silhouette are within normal limits. IMPRESSION: 1. Low lung volumes without radiographic evidence of acute cardiopulmonary disease. Electronically Signed   By: Quillian Quince  Entrikin M.D.   On: 04/09/2022 07:39     Medical Consultants:   None.   Subjective:    [name] relates her breathing is unchanged still short of breath  Objective:    Vitals:   04/09/22 2357 04/10/22 0042 04/10/22 0456 04/10/22 0813  BP:  (!) 121/55 126/63 119/62  Pulse: 65 66 74 72  Resp: '18 17 17 18  '$ Temp:  98.3 F (36.8 C) 98.1 F (36.7 C) 97.9 F (36.6 C)  TempSrc:   Oral Oral  SpO2:  97% 91% 100%  Weight:      Height:       SpO2: 100 % O2 Flow Rate (L/min): 3.5 L/min FiO2 (%): 32 %   Intake/Output Summary (Last 24 hours) at 04/10/2022 0858 Last data filed at 04/09/2022 1600 Gross per 24 hour  Intake 49.21 ml  Output --  Net 49.21 ml   Filed Weights   04/09/22 1427  Weight: (!) 145 kg    Exam: General exam: In no acute distress. Respiratory system: Good air movement and clear to auscultation. Cardiovascular system: S1 & S2 heard, RRR. No JVD. Gastrointestinal system: Abdomen is  nondistended, soft and nontender.  Extremities: No pedal edema. Skin: No rashes, lesions or ulcers Psychiatry: Judgement and insight appear normal. Mood & affect appropriate.    Data Reviewed:    Labs: Basic Metabolic Panel: Recent Labs  Lab 04/04/22 0535 04/04/22 0800 04/05/22 0409 04/06/22 0406 04/09/22 0724 04/09/22 0809  NA 141  --  135 137 141 141  K 2.9*  --  4.0 3.9 3.2* 3.3*  CL 110  --  109 109 108  --   CO2 21*  --  16* 22 22  --   GLUCOSE 92  --  93 82 122*  --   BUN 11  --  '12 17 16  '$ --   CREATININE 0.94  --  0.88 0.82 1.08*  --   CALCIUM 8.4*  --  8.4* 8.6* 8.2*  --   MG  --  2.0  --  2.1  --   --    GFR Estimated Creatinine Clearance: 93.1 mL/min (A) (by C-G formula based on SCr of 1.08 mg/dL (H)). Liver Function Tests: Recent Labs  Lab 04/05/22 0409  AST 19  ALT 14  ALKPHOS 64  BILITOT 0.4  PROT 7.2  ALBUMIN 3.4*   No results for input(s): "LIPASE", "AMYLASE" in the last 168 hours. No results for input(s): "AMMONIA" in the last 168 hours. Coagulation profile No results for input(s): "INR", "PROTIME" in the last 168 hours. COVID-19 Labs  No results for input(s): "DDIMER", "FERRITIN", "LDH", "CRP" in the last 72 hours.  Lab Results  Component Value Date   SARSCOV2NAA NEGATIVE 04/04/2022   SARSCOV2NAA NEGATIVE 01/08/2022   SARSCOV2NAA NEGATIVE 10/23/2021   [place] NEGATIVE 10/08/2021    CBC: Recent Labs  Lab 04/04/22 0535 04/05/22 0409 04/06/22 0406 04/09/22 0724 04/09/22 0809  WBC 19.3* 16.9* 16.8* 19.4*  --   NEUTROABS 10.1*  --  9.0*  --   --   HGB 11.4* 11.5* 11.5* 12.4 13.6  HCT 37.3 38.4 37.1 39.5 40.0  MCV 82.5 84.4 83.2 82.0  --   PLT 362 331 384 479*  --    Cardiac Enzymes: No results for input(s): "CKTOTAL", "CKMB", "CKMBINDEX", "TROPONINI" in the last 168 hours. BNP (last 3 results) No results for input(s): "PROBNP" in the last 8760 hours. CBG: No results for input(s): "GLUCAP" in the last 168  hours. D-Dimer: No results for input(s): "DDIMER" in the last 72 hours. Hgb A1c: No results for input(s): "HGBA1C" in the last 72 hours. Lipid Profile: No results for input(s): "CHOL", "HDL", "LDLCALC", "TRIG", "CHOLHDL", "LDLDIRECT" in the last 72 hours. Thyroid function studies: No results for input(s): "TSH", "T4TOTAL", "T3FREE", "THYROIDAB" in the last 72 hours.  Invalid input(s): "FREET3" Anemia work up: No results for input(s): "VITAMINB12", "FOLATE", "FERRITIN", "TIBC", "IRON", "RETICCTPCT" in the last 72 hours. Sepsis Labs: Recent Labs  Lab 04/04/22 0535 04/05/22 0409 04/06/22 0406 04/09/22 0724  WBC 19.3* 16.9* 16.8* 19.4*   Microbiology Recent Results (from the past 240 hour(s))  Resp Panel by RT-PCR (Flu A&B, Covid) Anterior Nasal Swab     Status: None   Collection Time: 04/04/22  5:35 AM   Specimen: Anterior Nasal Swab  Result Value Ref Range Status   SARS Coronavirus 2 by RT PCR NEGATIVE NEGATIVE Final    Comment: (NOTE) SARS-CoV-2 target nucleic acids are NOT DETECTED.  The SARS-CoV-2 RNA is generally detectable in upper respiratory specimens during the acute phase of infection. The lowest concentration of SARS-CoV-2 viral copies this assay can detect is 138 copies/mL. A negative result does not preclude SARS-Cov-2 infection and should not be used as the sole basis for treatment or other patient management decisions. A negative result may occur with  improper specimen collection/handling, submission of specimen other than nasopharyngeal swab, presence of viral mutation(s) within the areas targeted by this assay, and inadequate number of viral copies(<138 copies/mL). A negative result must be combined with clinical observations, patient history, and epidemiological information. The expected result is Negative.  Fact Sheet for Patients:  EntrepreneurPulse.com.au  Fact Sheet for Healthcare Providers:   IncredibleEmployment.be  This test is no t yet approved or cleared by the [place] FDA and  has been authorized for detection and/or diagnosis of SARS-CoV-2 by FDA under an Emergency Use Authorization (EUA). This EUA will remain  in effect (meaning this test can be used) for the duration of the COVID-19 declaration under Section 564(b)(1) of the Act, 21 U.S.C.section 360bbb-3(b)(1), unless the authorization is terminated  or revoked sooner.       Influenza A by PCR NEGATIVE NEGATIVE Final   Influenza B by PCR NEGATIVE NEGATIVE Final    Comment: (NOTE) The Xpert Xpress SARS-CoV-2/FLU/RSV plus assay is intended as an aid in the diagnosis of influenza from Nasopharyngeal swab specimens and should not be used as a sole basis for treatment. Nasal washings and aspirates are unacceptable for Xpert Xpress SARS-CoV-2/FLU/RSV testing.  Fact Sheet for Patients: EntrepreneurPulse.com.au  Fact Sheet for Healthcare Providers: IncredibleEmployment.be  This test is not yet approved or cleared by the [place] FDA and has been authorized for detection and/or diagnosis of SARS-CoV-2 by FDA under an Emergency Use Authorization (EUA). This EUA will remain in effect (meaning this test can be used) for the duration of the COVID-19 declaration under Section 564(b)(1) of the Act, 21 U.S.C. section 360bbb-3(b)(1), unless the authorization is terminated or revoked.  Performed at [facility], [place] [address], [place], [place] [address]   Respiratory (~20 pathogens) panel by PCR     Status: None   Collection Time: 04/04/22  5:35 AM   Specimen: Nasopharyngeal Swab; Respiratory  Result Value Ref Range Status   Adenovirus NOT DETECTED NOT DETECTED Final   Coronavirus 229E NOT DETECTED NOT DETECTED Final    Comment: (NOTE) The Coronavirus on the Respiratory Panel, DOES NOT test for the novel  Coronavirus (  2019 nCoV)     Coronavirus HKU1 NOT DETECTED NOT DETECTED Final   Coronavirus NL63 NOT DETECTED NOT DETECTED Final   Coronavirus OC43 NOT DETECTED NOT DETECTED Final   Metapneumovirus NOT DETECTED NOT DETECTED Final   Rhinovirus / Enterovirus NOT DETECTED NOT DETECTED Final   Influenza A NOT DETECTED NOT DETECTED Final   Influenza B NOT DETECTED NOT DETECTED Final   Parainfluenza Virus 1 NOT DETECTED NOT DETECTED Final   Parainfluenza Virus 2 NOT DETECTED NOT DETECTED Final   Parainfluenza Virus 3 NOT DETECTED NOT DETECTED Final   Parainfluenza Virus 4 NOT DETECTED NOT DETECTED Final   Respiratory Syncytial Virus NOT DETECTED NOT DETECTED Final   Bordetella pertussis NOT DETECTED NOT DETECTED Final   Bordetella Parapertussis NOT DETECTED NOT DETECTED Final   Chlamydophila pneumoniae NOT DETECTED NOT DETECTED Final   Mycoplasma pneumoniae NOT DETECTED NOT DETECTED Final    Comment: Performed at [facility], [place] [address], [place], [place] [address]     Medications:    amLODipine  10 mg Oral Daily   apixaban  5 mg Oral BID   benzonatate  200 mg Oral TID   carvedilol  6.25 mg Oral Daily   cloNIDine  0.2 mg Oral BID   doxycycline  100 mg Oral Q12H   famotidine  40 mg Oral Daily   FLUoxetine  20 mg Oral Daily   fluticasone furoate-vilanterol  1 puff Inhalation Daily   furosemide  20 mg Oral Daily   guaiFENesin  1,200 mg Oral BID   levothyroxine  50 mcg Oral Daily   pantoprazole  40 mg Oral Daily   polyethylene glycol  34 g Oral Daily   potassium chloride SA  20 mEq Oral Daily   predniSONE  40 mg Oral Q breakfast   pregabalin  300 mg Oral TID   topiramate  200 mg Oral QHS   zolpidem  5 mg Oral QHS   Continuous Infusions:    LOS: 1 day   Charlynne Cousins  [facility]  04/10/2022, 8:58 AM

## 2022-04-11 ENCOUNTER — Ambulatory Visit (HOSPITAL_COMMUNITY): Payer: Medicare Other | Admitting: Licensed Clinical Social Worker

## 2022-04-11 DIAGNOSIS — J9601 Acute respiratory failure with hypoxia: Secondary | ICD-10-CM

## 2022-04-11 NOTE — Progress Notes (Signed)
Mobility Specialist: Progress Note   04/11/22 1232  Mobility  Activity Ambulated independently in hallway  Level of Assistance Modified independent, requires aide device or extra time  Assistive Device None  Distance Ambulated (ft) 120 ft  Activity Response Tolerated well  Mobility Referral Yes  $Mobility charge 1 Mobility   Pre-Mobility on 1 L/min [place]: 79 HR, 100% SpO2 Post-Mobility on 2 L/min [place]: 80 HR, 113/73 (86) BP, 100% SpO2  Pt received in the bed and agreeable to mobility. Mod I with bed mobility as well as during ambulation. Ambulated on 2 L/min [place] for comfort. C/o SOB with some wheezing noted throughout. No c/o pain or dizziness. Pt to BR after session and instructed to pull call string if she needs help to be assist back to bed.   [place] April [name] Mobility Specialist Please contact via SecureChat or Rehab office at [phone]

## 2022-04-11 NOTE — Progress Notes (Signed)
[facility] PROGRESS NOTE    Progress Note  April [name]  WLN:[id number] DOB: 1970-08-23 DOA: 04/09/2022 PCP: [name], MD     Brief Narrative:   April [name] is an 51 y.o. female past medical history significant for severe asthma/COPD, essential hypertension, morbid obesity obstructive sleep apnea on CPAP rheumatoid arthritis, recently discharged from the hospital 2 days prior to admission for COPD exacerbation comes in that worsen after discharge.  She cannot identify a clear trigger for her flareup except for family members smoking the day prior to admission    Assessment/Plan:   Acute respiratory failure with hypoxia and hypercapnia probably secondary to acute asthma/COPD exacerbation: Frequent flares, with unclear triggers as per patient suspect smoking might be the culprit on this admission. Continue steroids inhalers and antibiotics. Still requiring 3 days of oxygen to keep saturations greater than 90%. Wean to keep room air, out of bed to chair continue symptom spirometry  Essential hypertension: Continue midodrine and Coreg.  Paroxysmal atrial fibrillation: Currently in sinus rhythm continue Eliquis and Coreg.  Obstructive sleep apnea: Continue CPAP at night.  She is not using her CPAP.  Hospital.  Chronic diastolic heart failure: Blood pressure is well-controlled patient appears euvolemic.  Anxiety/depression: Continue Prozac and clonazepam.  Her psychiatrist discontinued Cymbalta.  Morbid obesity: Noted.  DVT prophylaxis: lovenox Family Communication:none Status is: Inpatient Remains inpatient appropriate because: Acute respiratory failure with hypoxia    Code Status:     Code Status Orders  (From admission, onward)           Start     Ordered   04/09/22 1144  Full code  Continuous        04/09/22 1143           Code Status History     Date Active Date Inactive Code Status Order ID Comments User Context   04/04/2022 1408  04/07/2022 2101 Full Code [id number]  Jonnie Finner, DO ED   01/08/2022 0350 01/13/2022 1652 Full Code [id number]  [name], MD ED   10/24/2021 0742 10/26/2021 1605 Full Code [id number]  Shela Leff, MD Inpatient   10/09/2021 0301 10/18/2021 1610 Full Code [id number]  Toy Baker, MD Inpatient   08/26/2021 2234 08/30/2021 1719 Full Code [id number]  Shela Leff, MD Inpatient   08/23/2021 2319 08/24/2021 0854 Full Code [id number]  Lenore Cordia, MD ED   05/23/2021 1744 05/26/2021 1730 Full Code [id number]  Terrilee Croak, MD ED   02/03/2021 2258 02/07/2021 1554 Full Code [id number]  Marcelyn Bruins, MD ED   02/20/2019 0743 02/20/2019 1648 Full Code [id number]  Shela Leff, MD ED   02/14/2019 0453 02/14/2019 1237 Full Code [id number]  [name], DO ED   10/26/2018 0456 10/27/2018 1806 Full Code [id number]  [name], MD Inpatient   08/02/2018 2303 08/04/2018 1532 Full Code [id number]  [name], MD ED   05/16/2018 0548 05/20/2018 1739 Full Code [id number]  [name], MD ED   10/08/2017 1637 10/15/2017 1722 Full Code [id number]  [name], MD ED   08/07/2017 0405 08/18/2017 2018 Full Code [id number]  Merton Border, MD ED   07/29/2017 0627 08/06/2017 1524 Full Code [id number]  [name], MD ED   07/07/2017 0717 07/12/2017 2008 Full Code [id number]  Rondel Jumbo, PA-C ED   06/06/2017 0836 06/08/2017 2107 Full Code [id number]  [name], MD ED   05/26/2017 0617 06/01/2017 1653 Full Code [id number]  [name], MD ED  04/27/2017 1623 04/30/2017 1742 Full Code [id number]  Rondel Jumbo, PA-C ED   02/15/2017 0226 02/16/2017 1806 Full Code [id number]  [name], MD Inpatient   02/11/2017 0038 02/14/2017 0537 Full Code [id number]  [name], MD ED   01/27/2017 1024 02/01/2017 1751 Full Code [id number]  Jani Gravel, MD ED   01/17/2017 0514 01/17/2017 2021 Full Code [id number]  [name], MD ED   01/01/2017 0818 01/02/2017 1920 Full Code [id number]  [name], NP ED   12/21/2016 2015 12/22/2016 1723 Full Code [id number]  [name], MD ED   12/10/2016 1852 12/13/2016 1229 Full Code [id number]  [name], MD ED   11/13/2016 2017 11/15/2016 1758 Full Code [id number]  [name], MD ED   10/19/2016 0204 10/20/2016 1755 Full Code [id number]  [name], MD ED   10/02/2016 0553 10/04/2016 2204 Full Code [id number]  [name], MD Inpatient   06/22/2016 2104 06/25/2016 1707 Full Code [id number]  [name], DO ED   05/05/2016 2044 05/07/2016 1601 Full Code [id number]  [name], MD Inpatient   01/30/2016 1937 01/31/2016 1801 Full Code [id number]  [name], MD Inpatient   12/19/2015 0640 12/21/2015 1851 Full Code [id number]  [name], MD Inpatient   11/27/2015 0019 11/27/2015 2031 Full Code [id number]  [name], MD ED   11/10/2015 1408 11/12/2015 1641 Full Code [id number]  [name], MD ED   10/24/2015 0933 10/26/2015 1626 Full Code [id number]  [name], MD ED   08/22/2015 2052 08/24/2015 1700 Full Code [id number]  [name], MD ED   08/14/2015 1624 08/15/2015 1644 Full Code [id number]  [name], MD ED   07/12/2015 1429 07/13/2015 0015 Full Code [id number]  [name], MD Inpatient   05/23/2015 1650 05/24/2015 1819 Full Code [id number]  [name], MD ED   03/13/2015 1143 03/14/2015 1742 Full Code [id number]  [name], MD Inpatient   11/03/2014 0444 11/03/2014 1823 Full Code [id number]  [name], MD Inpatient   09/15/2014 1326 09/16/2014 1658 Full Code [id number]  [name] Inpatient   09/08/2014 1401 09/09/2014 1752 Full Code [id number]  [name], MD Inpatient   03/27/2014 2111 03/28/2014 1754 Full Code [id number]  [name], MD Inpatient   11/23/2013 1717 11/24/2013 0009 Full Code [id number]  Clinton Gallant, MD Inpatient   06/06/2013 2326 06/07/2013 1912 Full Code [id number]  [name], MD Inpatient   11/02/2012 1854 11/03/2012 1909 Full Code [id number]  [name], MD  Inpatient   10/21/2012 1030 10/23/2012 1730 Full Code [id number]  [name], MD Inpatient   10/06/2012 2349 10/07/2012 0657 Full Code [id number]  [name], MD Inpatient   05/22/2012 2200 05/23/2012 1627 Full Code [id number]  [name], RN Inpatient   02/27/2012 0219 02/27/2012 1728 Full Code [id number]  [name], MD Inpatient         IV Access:   Peripheral IV   Procedures and diagnostic studies:   No results found.   Medical Consultants:   None.   Subjective:    April [name] breathing is slightly better than yesterday.  Objective:    Vitals:   04/10/22 1715 04/10/22 1955 04/11/22 0449 04/11/22 0820  BP:  130/71 129/72 124/66  Pulse:  83 77 75  Resp:  '18 18 18  '$ Temp:  98.3 F (36.8 C) 98.6 F (37 C) (!) 97.4 F (36.3 C)  TempSrc:  Oral  Oral  SpO2: 100% 95% 93%   Weight:      Height:       SpO2: 93 % O2 Flow Rate (L/min): 3 L/min FiO2 (%): 32 %   Intake/Output Summary (Last 24 hours) at 04/11/2022 0842 Last data filed at 04/10/2022 2130 Gross per 24 hour  Intake 460 ml  Output --  Net 460 ml    Filed Weights   04/09/22 1427  Weight: (!) 145 kg    Exam: General exam: In no acute distress. Respiratory system: Good air movement and clear to auscultation. Cardiovascular system: S1 & S2 heard, RRR. No JVD. Gastrointestinal system: Abdomen is nondistended, soft and nontender.  Extremities: No pedal edema. Skin: No rashes, lesions or ulcers Psychiatry: Judgement and insight appear normal. Mood & affect appropriate.  Data Reviewed:    Labs: Basic Metabolic Panel: Recent Labs  Lab 04/05/22 0409 04/06/22 0406 04/09/22 0724 04/09/22 0809  NA 135 137 141 141  K 4.0 3.9 3.2* 3.3*  CL 109 109 108  --   CO2 16* 22 22  --   GLUCOSE 93 82 122*  --   BUN '12 17 16  '$ --   CREATININE 0.88 0.82 1.08*  --   CALCIUM 8.4* 8.6* 8.2*  --   MG  --  2.1  --   --     GFR Estimated Creatinine Clearance: 93.1 mL/min (A) (by  C-G formula based on SCr of 1.08 mg/dL (H)). Liver Function Tests: Recent Labs  Lab 04/05/22 0409  AST 19  ALT 14  ALKPHOS 64  BILITOT 0.4  PROT 7.2  ALBUMIN 3.4*    No results for input(s): "LIPASE", "AMYLASE" in the last 168 hours. No results for input(s): "AMMONIA" in the last 168 hours. Coagulation profile No results for input(s): "INR", "PROTIME" in the last 168 hours. COVID-19 Labs  No results for input(s): "DDIMER", "FERRITIN", "LDH", "CRP" in the last 72 hours.  Lab Results  Component Value Date   SARSCOV2NAA NEGATIVE 04/04/2022   SARSCOV2NAA NEGATIVE 01/08/2022   SARSCOV2NAA NEGATIVE 10/23/2021   [place] NEGATIVE 10/08/2021    CBC: Recent Labs  Lab 04/05/22 0409 04/06/22 0406 04/09/22 0724 04/09/22 0809  WBC 16.9* 16.8* 19.4*  --   NEUTROABS  --  9.0*  --   --   HGB 11.5* 11.5* 12.4 13.6  HCT 38.4 37.1 39.5 40.0  MCV 84.4 83.2 82.0  --   PLT 331 384 479*  --     Cardiac Enzymes: No results for input(s): "CKTOTAL", "CKMB", "CKMBINDEX", "TROPONINI" in the last 168 hours. BNP (last 3 results) No results for input(s): "PROBNP" in the last 8760 hours. CBG: No results for input(s): "GLUCAP" in the last 168 hours. D-Dimer: No results for input(s): "DDIMER" in the last 72 hours. Hgb A1c: No results for input(s): "HGBA1C" in the last 72 hours. Lipid Profile: No results for input(s): "CHOL", "HDL", "LDLCALC", "TRIG", "CHOLHDL", "LDLDIRECT" in the last 72 hours. Thyroid function studies: No results for input(s): "TSH", "T4TOTAL", "T3FREE", "THYROIDAB" in the last 72 hours.  Invalid input(s): "FREET3" Anemia work up: No results for input(s): "VITAMINB12", "FOLATE", "FERRITIN", "TIBC", "IRON", "RETICCTPCT" in the last 72 hours. Sepsis Labs: Recent Labs  Lab 04/05/22 0409 04/06/22 0406 04/09/22 0724  WBC 16.9* 16.8* 19.4*    Microbiology Recent Results (from the past 240 hour(s))  Resp Panel by RT-PCR (Flu A&B, Covid) Anterior Nasal Swab      Status: None   Collection Time: 04/04/22  5:35 AM   Specimen:  Anterior Nasal Swab  Result Value Ref Range Status   SARS Coronavirus 2 by RT PCR NEGATIVE NEGATIVE Final    Comment: (NOTE) SARS-CoV-2 target nucleic acids are NOT DETECTED.  The SARS-CoV-2 RNA is generally detectable in upper respiratory specimens during the acute phase of infection. The lowest concentration of SARS-CoV-2 viral copies this assay can detect is 138 copies/mL. A negative result does not preclude SARS-Cov-2 infection and should not be used as the sole basis for treatment or other patient management decisions. A negative result may occur with  improper specimen collection/handling, submission of specimen other than nasopharyngeal swab, presence of viral mutation(s) within the areas targeted by this assay, and inadequate number of viral copies(<138 copies/mL). A negative result must be combined with clinical observations, patient history, and epidemiological information. The expected result is Negative.  Fact Sheet for Patients:  EntrepreneurPulse.com.au  Fact Sheet for Healthcare Providers:  IncredibleEmployment.be  This test is no t yet approved or cleared by the [place] FDA and  has been authorized for detection and/or diagnosis of SARS-CoV-2 by FDA under an Emergency Use Authorization (EUA). This EUA will remain  in effect (meaning this test can be used) for the duration of the COVID-19 declaration under Section 564(b)(1) of the Act, 21 U.S.C.section 360bbb-3(b)(1), unless the authorization is terminated  or revoked sooner.       Influenza A by PCR NEGATIVE NEGATIVE Final   Influenza B by PCR NEGATIVE NEGATIVE Final    Comment: (NOTE) The Xpert Xpress SARS-CoV-2/FLU/RSV plus assay is intended as an aid in the diagnosis of influenza from Nasopharyngeal swab specimens and should not be used as a sole basis for treatment. Nasal washings and aspirates are  unacceptable for Xpert Xpress SARS-CoV-2/FLU/RSV testing.  Fact Sheet for Patients: EntrepreneurPulse.com.au  Fact Sheet for Healthcare Providers: IncredibleEmployment.be  This test is not yet approved or cleared by the [place] FDA and has been authorized for detection and/or diagnosis of SARS-CoV-2 by FDA under an Emergency Use Authorization (EUA). This EUA will remain in effect (meaning this test can be used) for the duration of the COVID-19 declaration under Section 564(b)(1) of the Act, 21 U.S.C. section 360bbb-3(b)(1), unless the authorization is terminated or revoked.  Performed at [facility], [place] [address], [place],  [address]   Respiratory (~20 pathogens) panel by PCR     Status: None   Collection Time: 04/04/22  5:35 AM   Specimen: Nasopharyngeal Swab; Respiratory  Result Value Ref Range Status   Adenovirus NOT DETECTED NOT DETECTED Final   Coronavirus 229E NOT DETECTED NOT DETECTED Final    Comment: (NOTE) The Coronavirus on the Respiratory Panel, DOES NOT test for the novel  Coronavirus (2019 nCoV)    Coronavirus HKU1 NOT DETECTED NOT DETECTED Final   Coronavirus NL63 NOT DETECTED NOT DETECTED Final   Coronavirus OC43 NOT DETECTED NOT DETECTED Final   Metapneumovirus NOT DETECTED NOT DETECTED Final   Rhinovirus / Enterovirus NOT DETECTED NOT DETECTED Final   Influenza A NOT DETECTED NOT DETECTED Final   Influenza B NOT DETECTED NOT DETECTED Final   Parainfluenza Virus 1 NOT DETECTED NOT DETECTED Final   Parainfluenza Virus 2 NOT DETECTED NOT DETECTED Final   Parainfluenza Virus 3 NOT DETECTED NOT DETECTED Final   Parainfluenza Virus 4 NOT DETECTED NOT DETECTED Final   Respiratory Syncytial Virus NOT DETECTED NOT DETECTED Final   Bordetella pertussis NOT DETECTED NOT DETECTED Final   Bordetella Parapertussis NOT DETECTED NOT DETECTED Final   Chlamydophila  pneumoniae NOT DETECTED NOT DETECTED  Final   Mycoplasma pneumoniae NOT DETECTED NOT DETECTED Final    Comment: Performed at [facility], [place] [address] [place], [place] [address]     Medications:    amLODipine  10 mg Oral Daily   apixaban  5 mg Oral BID   benzonatate  200 mg Oral TID   carvedilol  6.25 mg Oral Daily   cloNIDine  0.2 mg Oral BID   doxycycline  100 mg Oral Q12H   famotidine  40 mg Oral Daily   FLUoxetine  20 mg Oral Daily   fluticasone furoate-vilanterol  1 puff Inhalation Daily   furosemide  20 mg Oral Daily   guaiFENesin  1,200 mg Oral BID   ipratropium-albuterol  3 mL Nebulization Q4H   levothyroxine  50 mcg Oral Daily   pantoprazole  40 mg Oral Daily   polyethylene glycol  34 g Oral Daily   potassium chloride  40 mEq Oral BID   predniSONE  40 mg Oral Q breakfast   pregabalin  300 mg Oral TID   topiramate  200 mg Oral QHS   zolpidem  5 mg Oral QHS   Continuous Infusions:    LOS: 2 days   Charlynne Cousins  [facility]  04/11/2022, 8:42 AM

## 2022-04-12 ENCOUNTER — Other Ambulatory Visit: Payer: Self-pay | Admitting: Family Medicine

## 2022-04-12 ENCOUNTER — Other Ambulatory Visit (HOSPITAL_COMMUNITY): Payer: Self-pay

## 2022-04-12 MED ORDER — POTASSIUM CHLORIDE CRYS ER 20 MEQ PO TBCR
20.0000 meq | EXTENDED_RELEASE_TABLET | Freq: Two times a day (BID) | ORAL | Status: DC
  Administered 2022-04-12: 20 meq via ORAL
  Filled 2022-04-12: qty 1

## 2022-04-12 MED ORDER — PREDNISONE 10 MG PO TABS
ORAL_TABLET | ORAL | 0 refills | Status: DC
  Filled 2022-04-12: qty 21, 6d supply, fill #0

## 2022-04-12 MED ORDER — OXYCODONE-ACETAMINOPHEN 5-325 MG PO TABS
1.0000 | ORAL_TABLET | Freq: Four times a day (QID) | ORAL | Status: DC | PRN
  Administered 2022-04-12 – 2022-04-13 (×2): 1 via ORAL
  Filled 2022-04-12 (×2): qty 1

## 2022-04-12 MED ORDER — DOXYCYCLINE HYCLATE 100 MG PO TABS
100.0000 mg | ORAL_TABLET | Freq: Two times a day (BID) | ORAL | 0 refills | Status: AC
  Filled 2022-04-12: qty 6, 3d supply, fill #0

## 2022-04-12 NOTE — Discharge Summary (Addendum)
Physician Discharge Summary  April [name] HEN:[id number] DOB: 1970-07-29 DOA: 04/09/2022  PCP: [name], MD  Admit date: 04/09/2022 Discharge date: 04/12/2022  Admitted From: Home Disposition:  Home  Recommendations for Outpatient Follow-up:  Follow up with PCP in 1-2 weeks Please obtain BMP/CBC in one week Home Health:No Equipment/Devices:None  Discharge Condition:Stable CODE STATUS:Full Diet recommendation: Heart Healthy  Brief/Interim Summary: 51 y.o. female past medical history significant for severe asthma/COPD, essential hypertension, morbid obesity obstructive sleep apnea on CPAP rheumatoid arthritis, recently discharged from the hospital 2 days prior to admission for COPD exacerbation comes in that worsen after discharge.  She cannot identify a clear trigger for her flareup except for family members smoking the day prior to admission    Discharge Diagnoses:  Principal Problem:   COPD (chronic obstructive pulmonary disease) ([place]) Active Problems:   Acute respiratory failure ([place])   Acute respiratory failure with hypoxia ([facility])   Chronic atrial fibrillation   Acute asthma exacerbation   Acute exacerbation of chronic obstructive pulmonary disease (COPD) ([place])  Acute asthma/COPD exacerbation: She has frequent flares unclear trigger suspect related to smoking. She was started on IV steroids inhalers and antibiotics her saturations improved. She was weaned to room air she was ambulating saturations remained greater than 88% on room air. She was transition to oral antibiotics and steroids as well as inhalers. She relates she continues to wheeze but I have explained to her that this is upper airway wheezing. When you ask her to purse her lips on expiration her wheezing resolves.  Essential hypertension: No change made to her medication continue midodrine and Coreg.  Paroxysmal atrial fibrillation: Currently sinus rhythm continue Coreg and Eliquis.  Obstructive of  sleep apnea/Possibly obesity hypoventilation syndrome: She has been noncompliant with his CPAP at night.  Chronic diastolic heart failure: Blood pressure appears well-controlled no changes made.  Anxiety and depression: Continue Prozac and clonazepam. She will need to follow-up with her psychiatry as an outpatient.  Some of her component of her dyspnea is probably anxiety related she is on clonazepam and I told her to follow-up with her psychiatry as an outpatient.  Hypokalemia: Repleted orally now resolved.  Morbid obesity: Noted.    Discharge Instructions  Discharge Instructions     Diet - low sodium heart healthy   Complete by: As directed    Increase activity slowly   Complete by: As directed       Allergies as of 04/12/2022       Reactions   Contrast Media [iodinated Contrast Media] Itching   CT contrast. Unknown per PT.         Medication List     TAKE these medications    albuterol 108 (90 Base) MCG/ACT inhaler Commonly known as: ProAir HFA Inhale 2 puffs into the lungs every 6 (six) hours as needed for wheezing or shortness of breath. What changed: when to take this   amLODipine 10 MG tablet Commonly known as: NORVASC Take 1 tablet (10 mg total) by mouth daily.   apixaban 5 MG Tabs tablet Commonly known as: ELIQUIS Take 1 tablet (5 mg total) by mouth 2 (two) times daily.   carvedilol 6.25 MG tablet Commonly known as: COREG TAKE 1 TABLET(6.25 MG) BY MOUTH TWICE DAILY WITH A MEAL What changed: See the new instructions.   clonazePAM 0.5 MG tablet Commonly known as: KlonoPIN Take 1 tablet (0.5 mg total) by mouth daily as needed for anxiety. What changed: when to take this   cloNIDine 0.2  MG tablet Commonly known as: CATAPRES Take 1 tablet (0.2 mg total) by mouth 2 (two) times daily.   doxycycline 100 MG tablet Commonly known as: VIBRA-TABS Take 1 tablet (100 mg total) by mouth every 12 (twelve) hours for 3 days.   EPINEPHrine 0.3 mg/0.3 mL  Soaj injection Commonly known as: EPI-PEN Inject 0.3 mg into the muscle as needed for anaphylaxis.   famotidine 40 MG tablet Commonly known as: PEPCID Take 40 mg by mouth daily.   FLUoxetine 20 MG capsule Commonly known as: PROZAC Take 1 capsule (20 mg total) by mouth daily. Take 1 capsule daily. What changed:  medication strength additional instructions   fluticasone 50 MCG/ACT nasal spray Commonly known as: FLONASE SHAKE LIQUID AND USE 1 SPRAY IN EACH NOSTRIL DAILY AS NEEDED FOR ALLERGIES OR RHINITIS What changed: See the new instructions.   furosemide 20 MG tablet Commonly known as: LASIX Take 1 tablet (20 mg total) by mouth daily.   ipratropium-albuterol 0.5-2.5 (3) MG/3ML Soln Commonly known as: DUONEB 1 neb every 6 hours if needed for asthma What changed:  how much to take how to take this when to take this reasons to take this additional instructions   levothyroxine 50 MCG tablet Commonly known as: SYNTHROID Take 1 tablet (50 mcg total) by mouth daily.   naloxone 4 MG/0.1ML Liqd nasal spray kit Commonly known as: NARCAN Place 1 spray into the nose once as needed (opioid overdose).   oxyCODONE-acetaminophen 10-325 MG tablet Commonly known as: PERCOCET Take 1 tablet by mouth 5 (five) times daily as needed for pain.   Ozempic (1 MG/DOSE) 4 MG/3ML Sopn Generic drug: Semaglutide (1 MG/DOSE) Inject 1 mg into the skin once a week.   phentermine 15 MG capsule Take 15 mg by mouth daily.   potassium chloride SA 20 MEQ tablet Commonly known as: KLOR-CON M TAKE 1 TABLET(20 MEQ) BY MOUTH DAILY What changed: See the new instructions.   predniSONE 10 MG tablet Commonly known as: DELTASONE Takes 6 tablets for 1 days, then 5 tablets for 1 days, then 4 tablets for 1 days, then 3 tablets for 1 days, then 2 tabs for 1 days, then 1 tab for 1 days, and then stop. What changed:  medication strength how much to take how to take this when to take this additional  instructions   pregabalin 300 MG capsule Commonly known as: LYRICA Take 300 mg by mouth in the morning, at noon, and at bedtime.   Tezspire 210 MG/1.91ML Soaj Generic drug: Tezepelumab-ekko Inject 210 mg into the skin every 28 (twenty-eight) days.   topiramate 200 MG tablet Commonly known as: TOPAMAX Take 200 mg by mouth at bedtime.   Trelegy Ellipta 100-62.5-25 MCG/ACT Aepb Generic drug: Fluticasone-Umeclidin-Vilant Inhale 1 puff into the lungs daily.   Vitamin D (Ergocalciferol) 1.25 MG (50000 UNIT) Caps capsule Commonly known as: DRISDOL Take 50,000 Units by mouth once a week.   zolpidem 10 MG tablet Commonly known as: AMBIEN TAKE 1 TABLET BY MOUTH AT BEDTIME AS NEEDED FOR SLEEP What changed:  how much to take how to take this when to take this additional instructions        Allergies  Allergen Reactions   Contrast Media [Iodinated Contrast Media] Itching    CT contrast. Unknown per PT.     Consultations: None   Procedures/Studies: DG Chest 2 View  Result Date: 04/09/2022 CLINICAL DATA:  51 year old female with history of wheezing, cough and shortness of breath for the past 2 weeks.  EXAM: CHEST - 2 VIEW COMPARISON:  Chest x-ray 04/04/2022. FINDINGS: Lung volumes are low. No consolidative airspace disease. No pleural effusions. No pneumothorax. No pulmonary nodule or mass noted. Pulmonary vasculature and the cardiomediastinal silhouette are within normal limits. IMPRESSION: 1. Low lung volumes without radiographic evidence of acute cardiopulmonary disease. Electronically Signed   By: Vinnie Langton M.D.   On: 04/09/2022 07:39   DG Chest Port 1 View  Result Date: 04/04/2022 CLINICAL DATA:  Shortness of breath for 4 days. EXAM: PORTABLE CHEST 1 VIEW COMPARISON:  01/07/2022 FINDINGS: 0510 hours. Cardiopericardial silhouette is at upper limits of normal for size. The lungs are clear without focal pneumonia, edema, pneumothorax or pleural effusion. Evaluation of  lung parenchyma somewhat limited by technique and overlying soft tissue. Telemetry leads overlie the chest. IMPRESSION: No active disease. Electronically Signed   By: Misty Stanley M.D.   On: 04/04/2022 06:11   (Echo, Carotid, EGD, Colonoscopy, ERCP)    Subjective: No complains  Discharge Exam: Vitals:   04/12/22 0801 04/12/22 0829  BP:  113/71  Pulse:  76  Resp:  16  Temp:  98.1 F (36.7 C)  SpO2: 99% 100%   Vitals:   04/11/22 2209 04/12/22 0407 04/12/22 0801 04/12/22 0829  BP:  129/63  113/71  Pulse:  67  76  Resp:  16  16  Temp:  98.2 F (36.8 C)  98.1 F (36.7 C)  TempSrc:    Oral  SpO2: 100% 100% 99% 100%  Weight:      Height:        General: Pt is alert, awake, not in acute distress Cardiovascular: RRR, S1/S2 +, no rubs, no gallops Respiratory: CTA bilaterally, no wheezing, no rhonchi Abdominal: Soft, NT, ND, bowel sounds + Extremities: no edema, no cyanosis    The results of significant diagnostics from this hospitalization (including imaging, microbiology, ancillary and laboratory) are listed below for reference.     Microbiology: Recent Results (from the past 240 hour(s))  Resp Panel by RT-PCR (Flu A&B, Covid) Anterior Nasal Swab     Status: None   Collection Time: 04/04/22  5:35 AM   Specimen: Anterior Nasal Swab  Result Value Ref Range Status   SARS Coronavirus 2 by RT PCR NEGATIVE NEGATIVE Final    Comment: (NOTE) SARS-CoV-2 target nucleic acids are NOT DETECTED.  The SARS-CoV-2 RNA is generally detectable in upper respiratory specimens during the acute phase of infection. The lowest concentration of SARS-CoV-2 viral copies this assay can detect is 138 copies/mL. A negative result does not preclude SARS-Cov-2 infection and should not be used as the sole basis for treatment or other patient management decisions. A negative result may occur with  improper specimen collection/handling, submission of specimen other than nasopharyngeal swab, presence  of viral mutation(s) within the areas targeted by this assay, and inadequate number of viral copies(<138 copies/mL). A negative result must be combined with clinical observations, patient history, and epidemiological information. The expected result is Negative.  Fact Sheet for Patients:  EntrepreneurPulse.com.au  Fact Sheet for Healthcare Providers:  IncredibleEmployment.be  This test is no t yet approved or cleared by the [place] FDA and  has been authorized for detection and/or diagnosis of SARS-CoV-2 by FDA under an Emergency Use Authorization (EUA). This EUA will remain  in effect (meaning this test can be used) for the duration of the COVID-19 declaration under Section 564(b)(1) of the Act, 21 U.S.C.section 360bbb-3(b)(1), unless the authorization is terminated  or revoked sooner.  Influenza A by PCR NEGATIVE NEGATIVE Final   Influenza B by PCR NEGATIVE NEGATIVE Final    Comment: (NOTE) The Xpert Xpress SARS-CoV-2/FLU/RSV plus assay is intended as an aid in the diagnosis of influenza from Nasopharyngeal swab specimens and should not be used as a sole basis for treatment. Nasal washings and aspirates are unacceptable for Xpert Xpress SARS-CoV-2/FLU/RSV testing.  Fact Sheet for Patients: EntrepreneurPulse.com.au  Fact Sheet for Healthcare Providers: IncredibleEmployment.be  This test is not yet approved or cleared by the [place] FDA and has been authorized for detection and/or diagnosis of SARS-CoV-2 by FDA under an Emergency Use Authorization (EUA). This EUA will remain in effect (meaning this test can be used) for the duration of the COVID-19 declaration under Section 564(b)(1) of the Act, 21 U.S.C. section 360bbb-3(b)(1), unless the authorization is terminated or revoked.  Performed at [facility], [place] [address] [place], [place] [address]   Respiratory  (~20 pathogens) panel by PCR     Status: None   Collection Time: 04/04/22  5:35 AM   Specimen: Nasopharyngeal Swab; Respiratory  Result Value Ref Range Status   Adenovirus NOT DETECTED NOT DETECTED Final   Coronavirus 229E NOT DETECTED NOT DETECTED Final    Comment: (NOTE) The Coronavirus on the Respiratory Panel, DOES NOT test for the novel  Coronavirus (2019 nCoV)    Coronavirus HKU1 NOT DETECTED NOT DETECTED Final   Coronavirus NL63 NOT DETECTED NOT DETECTED Final   Coronavirus OC43 NOT DETECTED NOT DETECTED Final   Metapneumovirus NOT DETECTED NOT DETECTED Final   Rhinovirus / Enterovirus NOT DETECTED NOT DETECTED Final   Influenza A NOT DETECTED NOT DETECTED Final   Influenza B NOT DETECTED NOT DETECTED Final   Parainfluenza Virus 1 NOT DETECTED NOT DETECTED Final   Parainfluenza Virus 2 NOT DETECTED NOT DETECTED Final   Parainfluenza Virus 3 NOT DETECTED NOT DETECTED Final   Parainfluenza Virus 4 NOT DETECTED NOT DETECTED Final   Respiratory Syncytial Virus NOT DETECTED NOT DETECTED Final   Bordetella pertussis NOT DETECTED NOT DETECTED Final   Bordetella Parapertussis NOT DETECTED NOT DETECTED Final   Chlamydophila pneumoniae NOT DETECTED NOT DETECTED Final   Mycoplasma pneumoniae NOT DETECTED NOT DETECTED Final    Comment: Performed at [facility], [place]. [address], [place], [place] [address]     Labs: BNP (last 3 results) Recent Labs    10/23/21 1632 01/07/22 2342 04/04/22 0535  BNP 18.9 26.6 16.9   Basic Metabolic Panel: Recent Labs  Lab 04/06/22 0406 04/09/22 0724 04/09/22 0809  NA 137 141 141  K 3.9 3.2* 3.3*  CL 109 108  --   CO2 22 22  --   GLUCOSE 82 122*  --   BUN 17 16  --   CREATININE 0.82 1.08*  --   CALCIUM 8.6* 8.2*  --   MG 2.1  --   --    Liver Function Tests: No results for input(s): "AST", "ALT", "ALKPHOS", "BILITOT", "PROT", "ALBUMIN" in the last 168 hours. No results for input(s): "LIPASE", "AMYLASE" in the last 168 hours. No  results for input(s): "AMMONIA" in the last 168 hours. CBC: Recent Labs  Lab 04/06/22 0406 04/09/22 0724 04/09/22 0809  WBC 16.8* 19.4*  --   NEUTROABS 9.0*  --   --   HGB 11.5* 12.4 13.6  HCT 37.1 39.5 40.0  MCV 83.2 82.0  --   PLT 384 479*  --    Cardiac Enzymes: No results for input(s): "  CKTOTAL", "CKMB", "CKMBINDEX", "TROPONINI" in the last 168 hours. BNP: Invalid input(s): "POCBNP" CBG: No results for input(s): "GLUCAP" in the last 168 hours. D-Dimer No results for input(s): "DDIMER" in the last 72 hours. Hgb A1c No results for input(s): "HGBA1C" in the last 72 hours. Lipid Profile No results for input(s): "CHOL", "HDL", "LDLCALC", "TRIG", "CHOLHDL", "LDLDIRECT" in the last 72 hours. Thyroid function studies No results for input(s): "TSH", "T4TOTAL", "T3FREE", "THYROIDAB" in the last 72 hours.  Invalid input(s): "FREET3" Anemia work up No results for input(s): "VITAMINB12", "FOLATE", "FERRITIN", "TIBC", "IRON", "RETICCTPCT" in the last 72 hours. Urinalysis    Component Value Date/Time   COLORURINE YELLOW 10/23/2021 1826   APPEARANCEUR CLOUDY (A) 10/23/2021 1826   LABSPEC 1.027 10/23/2021 1826   PHURINE 5.0 10/23/2021 1826   GLUCOSEU NEGATIVE 10/23/2021 1826   GLUCOSEU NEG mg/dL 10/28/2007 2049   HGBUR NEGATIVE 10/23/2021 1826   BILIRUBINUR NEGATIVE 10/23/2021 1826   KETONESUR NEGATIVE 10/23/2021 1826   PROTEINUR NEGATIVE 10/23/2021 1826   UROBILINOGEN 1.0 11/21/2014 0707   NITRITE NEGATIVE 10/23/2021 1826   LEUKOCYTESUR LARGE (A) 10/23/2021 1826   Sepsis Labs Recent Labs  Lab 04/06/22 0406 04/09/22 0724  WBC 16.8* 19.4*   Microbiology Recent Results (from the past 240 hour(s))  Resp Panel by RT-PCR (Flu A&B, Covid) Anterior Nasal Swab     Status: None   Collection Time: 04/04/22  5:35 AM   Specimen: Anterior Nasal Swab  Result Value Ref Range Status   SARS Coronavirus 2 by RT PCR NEGATIVE NEGATIVE Final    Comment: (NOTE) SARS-CoV-2 target nucleic  acids are NOT DETECTED.  The SARS-CoV-2 RNA is generally detectable in upper respiratory specimens during the acute phase of infection. The lowest concentration of SARS-CoV-2 viral copies this assay can detect is 138 copies/mL. A negative result does not preclude SARS-Cov-2 infection and should not be used as the sole basis for treatment or other patient management decisions. A negative result may occur with  improper specimen collection/handling, submission of specimen other than nasopharyngeal swab, presence of viral mutation(s) within the areas targeted by this assay, and inadequate number of viral copies(<138 copies/mL). A negative result must be combined with clinical observations, patient history, and epidemiological information. The expected result is Negative.  Fact Sheet for Patients:  EntrepreneurPulse.com.au  Fact Sheet for Healthcare Providers:  IncredibleEmployment.be  This test is no t yet approved or cleared by the [place] FDA and  has been authorized for detection and/or diagnosis of SARS-CoV-2 by FDA under an Emergency Use Authorization (EUA). This EUA will remain  in effect (meaning this test can be used) for the duration of the COVID-19 declaration under Section 564(b)(1) of the Act, 21 U.S.C.section 360bbb-3(b)(1), unless the authorization is terminated  or revoked sooner.       Influenza A by PCR NEGATIVE NEGATIVE Final   Influenza B by PCR NEGATIVE NEGATIVE Final    Comment: (NOTE) The Xpert Xpress SARS-CoV-2/FLU/RSV plus assay is intended as an aid in the diagnosis of influenza from Nasopharyngeal swab specimens and should not be used as a sole basis for treatment. Nasal washings and aspirates are unacceptable for Xpert Xpress SARS-CoV-2/FLU/RSV testing.  Fact Sheet for Patients: EntrepreneurPulse.com.au  Fact Sheet for Healthcare Providers: IncredibleEmployment.be  This  test is not yet approved or cleared by the [place] FDA and has been authorized for detection and/or diagnosis of SARS-CoV-2 by FDA under an Emergency Use Authorization (EUA). This EUA will remain in effect (meaning this test can be used)  for the duration of the COVID-19 declaration under Section 564(b)(1) of the Act, 21 U.S.C. section 360bbb-3(b)(1), unless the authorization is terminated or revoked.  Performed at [facility], [place] [address] [place], [place] [address]   Respiratory (~20 pathogens) panel by PCR     Status: None   Collection Time: 04/04/22  5:35 AM   Specimen: Nasopharyngeal Swab; Respiratory  Result Value Ref Range Status   Adenovirus NOT DETECTED NOT DETECTED Final   Coronavirus 229E NOT DETECTED NOT DETECTED Final    Comment: (NOTE) The Coronavirus on the Respiratory Panel, DOES NOT test for the novel  Coronavirus (2019 nCoV)    Coronavirus HKU1 NOT DETECTED NOT DETECTED Final   Coronavirus NL63 NOT DETECTED NOT DETECTED Final   Coronavirus OC43 NOT DETECTED NOT DETECTED Final   Metapneumovirus NOT DETECTED NOT DETECTED Final   Rhinovirus / Enterovirus NOT DETECTED NOT DETECTED Final   Influenza A NOT DETECTED NOT DETECTED Final   Influenza B NOT DETECTED NOT DETECTED Final   Parainfluenza Virus 1 NOT DETECTED NOT DETECTED Final   Parainfluenza Virus 2 NOT DETECTED NOT DETECTED Final   Parainfluenza Virus 3 NOT DETECTED NOT DETECTED Final   Parainfluenza Virus 4 NOT DETECTED NOT DETECTED Final   Respiratory Syncytial Virus NOT DETECTED NOT DETECTED Final   Bordetella pertussis NOT DETECTED NOT DETECTED Final   Bordetella Parapertussis NOT DETECTED NOT DETECTED Final   Chlamydophila pneumoniae NOT DETECTED NOT DETECTED Final   Mycoplasma pneumoniae NOT DETECTED NOT DETECTED Final    Comment: Performed at [facility], [place]. [address] [place], [place] [address]      SIGNED:   Charlynne Cousins, MD  [facility] 04/12/2022, 9:23 AM Pager   If 7PM-7AM, please contact night-coverage [email or web address] Password [email or web address]

## 2022-04-12 NOTE — Plan of Care (Signed)
  Problem: Education: Goal: Knowledge of disease or condition will improve Outcome: Progressing Goal: Knowledge of the prescribed therapeutic regimen will improve Outcome: Progressing Goal: Individualized Educational Video(s) Outcome: Progressing   Problem: Activity: Goal: Ability to tolerate increased activity will improve Outcome: Progressing Goal: Will verbalize the importance of balancing activity with adequate rest periods Outcome: Progressing   Problem: Respiratory: Goal: Ability to maintain a clear airway will improve Outcome: Progressing Goal: Levels of oxygenation will improve Outcome: Progressing Goal: Ability to maintain adequate ventilation will improve Outcome: Progressing   Problem: Education: Goal: Knowledge of General Education information will improve Description: Including pain rating scale, medication(s)/side effects and non-pharmacologic comfort measures Outcome: Progressing   Problem: Health Behavior/Discharge Planning: Goal: Ability to manage health-related needs will improve Outcome: Progressing   Problem: Clinical Measurements: Goal: Ability to maintain clinical measurements within normal limits will improve Outcome: Progressing Goal: Will remain free from infection Outcome: Progressing Goal: Diagnostic test results will improve Outcome: Progressing Goal: Respiratory complications will improve Outcome: Progressing Goal: Cardiovascular complication will be avoided Outcome: Progressing   Problem: Activity: Goal: Risk for activity intolerance will decrease Outcome: Progressing   Problem: Nutrition: Goal: Adequate nutrition will be maintained Outcome: Progressing   Problem: Coping: Goal: Level of anxiety will decrease Outcome: Progressing   Problem: Elimination: Goal: Will not experience complications related to bowel motility Outcome: Progressing Goal: Will not experience complications related to urinary retention Outcome: Progressing    Problem: Pain Managment: Goal: General experience of comfort will improve Outcome: Progressing   Problem: Safety: Goal: Ability to remain free from injury will improve Outcome: Progressing   Problem: Skin Integrity: Goal: Risk for impaired skin integrity will decrease Outcome: Progressing   

## 2022-04-12 NOTE — TOC Progression Note (Signed)
Transition of Care [facility]) - Progression Note    Patient Details  Name: April [name] MRN: [id number] Date of Birth: [birth year]  [facility]) CM/SW Contact  Orbie Pyo Phone Number: 04/12/2022, 2:53 PM  Clinical Narrative:        [facility] Appeal Detailed Notice of Discharge letter created and saved: Yes Detailed Notice of Discharge Document Given to Pateint: Yes [employer] ROI Document Created: Yes [employer] appeal documents uploaded to [employer] stite: Yes

## 2022-04-12 NOTE — TOC Transition Note (Signed)
Discharge medications (2) are being stored in the main pharmacy on the ground floor until patient is ready for discharge.   

## 2022-04-12 NOTE — TOC Initial Note (Addendum)
Transition of Care [facility]) - Initial/Assessment Note    Patient Details  Name: April [name] MRN: [id number] Date of Birth: Jul 23, 1970  Transition of Care [facility]) CM/SW Contact:    Verdell Carmine, RN Phone Number: 04/12/2022, 8:51 AM  Clinical Narrative:                 Patient just discharged from [facility]. High Readmission Risk.  COPD< has oxygen at home via [employer].  Down to 2L PM here after mobility. Lives in apartment, is followed by CM Opal Sidles  at [facility].  CM will follow for needs, recommendations, and transitions of care 0945 Spoke with patient , She states the doctor just left and "they were talking discharge" confirmed there is a discharge order. She states she did not get her oxygen after last admission because the company [employer] forces technical [employer]) states that she owes them money. Another company, [employer] at [phone]  called her last night. She wants to use [employer] for oxygen. She states she will not use biPap at home. And does not want that, when mentioned. She then stated that the doctor just came in a out and did not tell her about discharge. She requests a new doctor. She also states she is appealing discharge as she does not feel she is ready to go home. She does state that she has somewhere to go and transportation. I did state to her that if she loses the appeal, she would be responsible for any days accrued when/after discharge order written. Contacted  Iris Bratton about Appeal. She will go give IM.  [place] and discharge letter done,   Expected Discharge Plan: Home/Self Care Barriers to Discharge: Continued Medical Work up  Patient Goals and CMS Choice        Expected Discharge Plan and Services Expected Discharge Plan: Home/Self Care       Living arrangements for the past 2 months: Apartment                                      Prior Living Arrangements/Services Living arrangements for the past 2 months: Apartment Lives with:: Self Patient  language and need for interpreter reviewed:: Yes        Need for Family Participation in Patient Care: Yes (Comment) Care giver support system in place?: Yes (comment)   Criminal Activity/Legal Involvement Pertinent to Current Situation/Hospitalization: No - Comment as needed  Activities of Daily Living Home Assistive Devices/Equipment: Bedside commode/3-in-1 ADL Screening (condition at time of admission) Patient's cognitive ability adequate to safely complete daily activities?: No Is the patient deaf or have difficulty hearing?: No Does the patient have difficulty seeing, even when wearing glasses/contacts?: No Does the patient have difficulty concentrating, remembering, or making decisions?: Yes Patient able to express need for assistance with ADLs?: Yes Does the patient have difficulty dressing or bathing?: No Independently performs ADLs?: Yes (appropriate for developmental age) Does the patient have difficulty walking or climbing stairs?: Yes Weakness of Legs: Both Weakness of Arms/Hands: None  Permission Sought/Granted                  Emotional Assessment       Orientation: : Oriented to Self, Oriented to  Time, Oriented to Place, Oriented to Situation Alcohol / Substance Use: Tobacco Use Psych Involvement: No (comment)  Admission diagnosis:  COPD (chronic obstructive pulmonary disease) ([place]) [J44.9] Severe persistent asthma  with exacerbation [J45.51] Patient Active Problem List   Diagnosis Date Noted   COPD (chronic obstructive pulmonary disease) ([place]) 04/09/2022   Severe persistent asthma with (acute) exacerbation 04/05/2022   COPD with acute exacerbation (HCC) 04/04/2022   Chronic pain syndrome    Severe persistent asthma with acute exacerbation in adult 08/23/2021   Hypothyroidism 08/23/2021   Chronic heart failure with preserved ejection fraction (HFpEF) ([place]) 08/23/2021   Primary osteoarthritis of right knee 07/04/2021   Body mass index 50.0-59.9, adult  ([place]) 07/04/2021   Painful orthopaedic hardware ([place]) 07/04/2021   COPD with exacerbation ([place]) 02/04/2021   Severe persistent asthma 07/04/2020   Acute respiratory failure ([place]) 02/20/2019   CHF exacerbation ([place]) 02/20/2019   Hot flashes    Asthma-COPD overlap syndrome 10/26/2018   Acute respiratory distress 08/03/2018   Acute exacerbation of chronic obstructive pulmonary disease (COPD) ([place]) 08/03/2018   Vitamin D deficiency 05/26/2018   Acute asthma exacerbation 05/16/2018   Urinary incontinence 11/17/2017   Nausea with vomiting 11/06/2017   Hypomagnesemia 10/27/2017   Dysphagia 10/27/2017   Physical deconditioning 10/25/2017   Acute maxillary sinusitis 10/24/2017   Emesis, persistent 10/08/2017   Volume depletion 10/08/2017   Chest pain 10/08/2017   SOB (shortness of breath) 10/08/2017   Debility 09/22/2017   AKI (acute kidney injury) ([place]) 08/18/2017   HCAP (healthcare-associated pneumonia) 08/06/2017   Chronic atrial fibrillation 07/29/2017   Acute respiratory failure with hypoxia (HCC) 07/07/2017   AF (paroxysmal atrial fibrillation) (HCC)    Dyspnea 02/04/2017   Asthma exacerbation 26/83/4196   Metabolic syndrome 22/29/7989   Moderate persistent asthma with exacerbation 10/19/2016   Normocytic anemia 10/02/2016   Elevated hemoglobin A1c 05/05/2016   GERD (gastroesophageal reflux disease) 08/30/2015   Anxiety and depression 08/30/2015   Generalized anxiety disorder 08/30/2015   Hyperlipidemia 12/08/2013   Seasonal allergic rhinitis 08/29/2013   Leukocytosis 10/21/2012   Tobacco abuse in remission 10/07/2012   COPD mixed type ([place]) 05/07/2012   Knee pain, bilateral 04/25/2011   Insomnia 03/14/2011   Obstructive sleep apnea 12/19/2010   Hypokalemia 08/13/2010   Cervical back pain with evidence of disc disease 04/08/2008   HTN (hypertension) 07/31/2006   Morbid obesity due to excess calories ([place]) 06/17/2006   Major depressive disorder, recurrent episode ([place])  04/10/2006   PCP:  [name], MD Pharmacy:   [facility] [place] [place] [address] Phone: [phone] Fax: [place] [address] Phone: [phone] Fax: [phone]     Social Determinants of Health (SDOH) Interventions Transportation Interventions: Intervention Not Indicated (patient denies, states sister helps)  Readmission Risk Interventions    01/10/2022   11:28 AM 10/24/2021   12:29 PM 10/16/2021   11:39 AM  Readmission Risk Prevention Plan  Transportation Screening Complete Complete Complete  Medication Review Press photographer) Complete Complete Complete  PCP or Specialist appointment within 3-5 days of discharge Complete Complete Complete  HRI or Home Care Consult Complete Complete Complete  SW Recovery Care/Counseling Consult Complete Complete   Palliative Care Screening Not Applicable Not [place] Not Applicable Not Applicable

## 2022-04-12 NOTE — Progress Notes (Signed)
Walked pt up and down hallway. Pt was on RA. Oxygenation level was 96-98%. HR 110-126. Pt had no SOB or chest pain while umbulating

## 2022-04-12 NOTE — Care Management Important Message (Signed)
Important Message  Patient Details  Name: April [name] MRN: [id number] Date of Birth: 11/18/1970   Medicare Important Message Given:  Yes     Orbie Pyo 04/12/2022, 1:36 PM

## 2022-04-12 NOTE — Progress Notes (Signed)
Mobility Specialist - Progress Note   04/12/22 1528  Mobility  Activity Refused mobility   Pt refused mobility stating she's already been walking today. Will follow up at a later date.  Franki Monte  Mobility Specialist Please contact via Solicitor or Rehab office at [phone]

## 2022-04-12 NOTE — Progress Notes (Signed)
Called MD earlier to inform him that pt had chronic pain and that she was taking percocet 10/325 mg 5 X/day at home. Also told him that pt requested to have Potassium restarted. Informed patient of the new order of Percocet 5/325 mg every 6 hours as needed for severe pain. Pt was very upset and demanded to see the Charge nurse. Charge nurse was notified of above.

## 2022-04-12 NOTE — Care Management Important Message (Signed)
Important Message  Patient Details  Name: April [name] MRN: [id number] Date of Birth: Aug 04, 1970   Medicare Important Message Given:  Yes     Katty Fretwell 04/12/2022, 1:35 PM

## 2022-04-13 LAB — BASIC METABOLIC PANEL
Anion gap: 8 (ref 5–15)
BUN: 19 mg/dL (ref 6–20)
CO2: 23 mmol/L (ref 22–32)
Calcium: 8.4 mg/dL — ABNORMAL LOW (ref 8.9–10.3)
Chloride: 109 mmol/L (ref 98–111)
Creatinine, Ser: 1.01 mg/dL — ABNORMAL HIGH (ref 0.44–1.00)
GFR, Estimated: >60 mL/min (ref >60)
Glucose, Bld: 100 mg/dL — ABNORMAL HIGH (ref 70–99)
Potassium: 3.5 mmol/L (ref 3.5–5.1)
Sodium: 140 mmol/L (ref 135–145)

## 2022-04-13 NOTE — Plan of Care (Signed)
  Problem: Education: Goal: Knowledge of disease or condition will improve Outcome: Progressing Goal: Knowledge of the prescribed therapeutic regimen will improve Outcome: Progressing Goal: Individualized Educational Video(s) Outcome: Progressing   Problem: Activity: Goal: Ability to tolerate increased activity will improve Outcome: Progressing Goal: Will verbalize the importance of balancing activity with adequate rest periods Outcome: Progressing   Problem: Respiratory: Goal: Ability to maintain a clear airway will improve Outcome: Progressing Goal: Levels of oxygenation will improve Outcome: Progressing Goal: Ability to maintain adequate ventilation will improve Outcome: Progressing   Problem: Education: Goal: Knowledge of General Education information will improve Description: Including pain rating scale, medication(s)/side effects and non-pharmacologic comfort measures Outcome: Progressing   Problem: Health Behavior/Discharge Planning: Goal: Ability to manage health-related needs will improve Outcome: Progressing   Problem: Clinical Measurements: Goal: Ability to maintain clinical measurements within normal limits will improve Outcome: Progressing Goal: Will remain free from infection Outcome: Progressing Goal: Diagnostic test results will improve Outcome: Progressing Goal: Respiratory complications will improve Outcome: Progressing Goal: Cardiovascular complication will be avoided Outcome: Progressing   Problem: Activity: Goal: Risk for activity intolerance will decrease Outcome: Progressing   Problem: Nutrition: Goal: Adequate nutrition will be maintained Outcome: Progressing   Problem: Coping: Goal: Level of anxiety will decrease Outcome: Progressing   Problem: Elimination: Goal: Will not experience complications related to bowel motility Outcome: Progressing Goal: Will not experience complications related to urinary retention Outcome: Progressing    Problem: Pain Managment: Goal: General experience of comfort will improve Outcome: Progressing   Problem: Safety: Goal: Ability to remain free from injury will improve Outcome: Progressing   Problem: Skin Integrity: Goal: Risk for impaired skin integrity will decrease Outcome: Progressing   

## 2022-04-13 NOTE — Care Management (Signed)
Status of Appeal per [place] website at time of this note:    Overall Status All documents have been received for Appeal Case: [id number].  1. Discharge Appeal Started 04/12/2022 10:53:18  2. Medical Record Requested 04/12/2022 11:43:18  3. Documents Received 04/12/2022 14:48:14  4. In Clinical Review 04/12/2022 14:40:05

## 2022-04-13 NOTE — Discharge Summary (Signed)
Physician Discharge Summary  April [name] KNL:[id number] DOB: 1970-09-26 DOA: 04/09/2022  PCP: [name], MD  Admit date: 04/09/2022 Discharge date: 04/13/2022  Admitted From: Home Disposition:  Home  Recommendations for Outpatient Follow-up:  Follow up with PCP in 1-2 weeks Please obtain BMP/CBC in one week Home Health:No Equipment/Devices:None  Discharge Condition:Stable CODE STATUS:Full Diet recommendation: Heart Healthy  Brief/Interim Summary: 51 y.o. female past medical history significant for severe asthma/COPD, essential hypertension, morbid obesity obstructive sleep apnea on CPAP rheumatoid arthritis, recently discharged from the hospital 2 days prior to admission for COPD exacerbation comes in that worsen after discharge.  She cannot identify a clear trigger for her flareup except for family members smoking the day prior to admission    Discharge Diagnoses:  Principal Problem:   COPD (chronic obstructive pulmonary disease) ([place]) Active Problems:   Acute respiratory failure ([place])   Acute respiratory failure with hypoxia ([facility])   Chronic atrial fibrillation   Acute asthma exacerbation   Acute exacerbation of chronic obstructive pulmonary disease (COPD) ([place])  Acute asthma/COPD exacerbation: She has frequent flares unclear trigger suspect related to smoking. She was started on IV steroids inhalers and antibiotics her saturations improved. She was weaned to room air she was ambulating saturations remained greater than 88% on room air. She was transition to oral antibiotics and steroids as well as inhalers. She relates she continues to wheeze but I have explained to her that this is upper airway wheezing. When you ask her to purse her lips on expiration her wheezing resolves. Patient refused to be d/c she relates she was going to appeal. Then the next day in the morning she said she wanted to be d/c.  Essential hypertension: No change made to her medication continue  midodrine and Coreg.  Paroxysmal atrial fibrillation: Currently sinus rhythm continue Coreg and Eliquis.  Obstructive of sleep apnea/Possibly obesity hypoventilation syndrome: She has been noncompliant with his CPAP at night.  Chronic diastolic heart failure: Blood pressure appears well-controlled no changes made.  Anxiety and depression: Continue Prozac and clonazepam. She will need to follow-up with her psychiatry as an outpatient.  Some of her component of her dyspnea is probably anxiety related she is on clonazepam and I told her to follow-up with her psychiatry as an outpatient.  Narcotics seeking behavior: Noted, she needs to be weaned of narcotics.  Hypokalemia: Repleted orally now resolved.  Morbid obesity: Noted.    Discharge Instructions  Discharge Instructions     Diet - low sodium heart healthy   Complete by: As directed    Diet - low sodium heart healthy   Complete by: As directed    Increase activity slowly   Complete by: As directed    Increase activity slowly   Complete by: As directed       Allergies as of 04/13/2022       Reactions   Contrast Media [iodinated Contrast Media] Itching   CT contrast. Unknown per PT.         Medication List     TAKE these medications    albuterol 108 (90 Base) MCG/ACT inhaler Commonly known as: ProAir HFA Inhale 2 puffs into the lungs every 6 (six) hours as needed for wheezing or shortness of breath. What changed: when to take this   amLODipine 10 MG tablet Commonly known as: NORVASC Take 1 tablet (10 mg total) by mouth daily.   apixaban 5 MG Tabs tablet Commonly known as: ELIQUIS Take 1 tablet (5 mg total) by  mouth 2 (two) times daily.   carvedilol 6.25 MG tablet Commonly known as: COREG TAKE 1 TABLET(6.25 MG) BY MOUTH TWICE DAILY WITH A MEAL What changed: See the new instructions.   clonazePAM 0.5 MG tablet Commonly known as: KlonoPIN Take 1 tablet (0.5 mg total) by mouth daily as needed for  anxiety. What changed: when to take this   cloNIDine 0.2 MG tablet Commonly known as: CATAPRES Take 1 tablet (0.2 mg total) by mouth 2 (two) times daily.   doxycycline 100 MG tablet Commonly known as: VIBRA-TABS Take 1 tablet (100 mg total) by mouth every 12 (twelve) hours for 3 days.   EPINEPHrine 0.3 mg/0.3 mL Soaj injection Commonly known as: EPI-PEN Inject 0.3 mg into the muscle as needed for anaphylaxis.   famotidine 40 MG tablet Commonly known as: PEPCID Take 40 mg by mouth daily.   FLUoxetine 20 MG capsule Commonly known as: PROZAC Take 1 capsule (20 mg total) by mouth daily. Take 1 capsule daily. What changed:  medication strength additional instructions   fluticasone 50 MCG/ACT nasal spray Commonly known as: FLONASE SHAKE LIQUID AND USE 1 SPRAY IN EACH NOSTRIL DAILY AS NEEDED FOR ALLERGIES OR RHINITIS What changed: See the new instructions.   furosemide 20 MG tablet Commonly known as: LASIX TAKE 1 TABLET(20 MG) BY MOUTH DAILY What changed: See the new instructions.   ipratropium-albuterol 0.5-2.5 (3) MG/3ML Soln Commonly known as: DUONEB 1 neb every 6 hours if needed for asthma What changed:  how much to take how to take this when to take this reasons to take this additional instructions   levothyroxine 50 MCG tablet Commonly known as: SYNTHROID Take 1 tablet (50 mcg total) by mouth daily.   naloxone 4 MG/0.1ML Liqd nasal spray kit Commonly known as: NARCAN Place 1 spray into the nose once as needed (opioid overdose).   oxyCODONE-acetaminophen 10-325 MG tablet Commonly known as: PERCOCET Take 1 tablet by mouth 5 (five) times daily as needed for pain.   Ozempic (1 MG/DOSE) 4 MG/3ML Sopn Generic drug: Semaglutide (1 MG/DOSE) Inject 1 mg into the skin once a week.   phentermine 15 MG capsule Take 15 mg by mouth daily.   potassium chloride SA 20 MEQ tablet Commonly known as: KLOR-CON M TAKE 1 TABLET(20 MEQ) BY MOUTH DAILY What changed: See the  new instructions.   predniSONE 10 MG tablet Commonly known as: DELTASONE Takes 6 tablets by mouth for 1 day, then 5 tablets for 1 day, then 4 tablets for 1 day, then 3 tablets for 1 day, then 2 tabs for 1 day, then 1 tab for 1 day, and then stop. What changed:  medication strength how much to take how to take this when to take this additional instructions   pregabalin 300 MG capsule Commonly known as: LYRICA Take 300 mg by mouth in the morning, at noon, and at bedtime.   Tezspire 210 MG/1.91ML Soaj Generic drug: Tezepelumab-ekko Inject 210 mg into the skin every 28 (twenty-eight) days.   topiramate 200 MG tablet Commonly known as: TOPAMAX Take 200 mg by mouth at bedtime.   Trelegy Ellipta 100-62.5-25 MCG/ACT Aepb Generic drug: Fluticasone-Umeclidin-Vilant Inhale 1 puff into the lungs daily.   Vitamin D (Ergocalciferol) 1.25 MG (50000 UNIT) Caps capsule Commonly known as: DRISDOL Take 50,000 Units by mouth once a week.   zolpidem 10 MG tablet Commonly known as: AMBIEN TAKE 1 TABLET BY MOUTH AT BEDTIME AS NEEDED FOR SLEEP What changed:  how much to take how to  take this when to take this additional instructions        Follow-up Information     [name], MD Follow up.   Specialty: Family Medicine Why: please make appt 1-2 weeks Contact information: [address] [place] [address] [phone]                Allergies  Allergen Reactions   Contrast Media [Iodinated Contrast Media] Itching    CT contrast. Unknown per PT.     Consultations: None   Procedures/Studies: DG Chest 2 View  Result Date: 04/09/2022 CLINICAL DATA:  51 year old female with history of wheezing, cough and shortness of breath for the past 2 weeks. EXAM: CHEST - 2 VIEW COMPARISON:  Chest x-ray 04/04/2022. FINDINGS: Lung volumes are low. No consolidative airspace disease. No pleural effusions. No pneumothorax. No pulmonary nodule or mass noted.  Pulmonary vasculature and the cardiomediastinal silhouette are within normal limits. IMPRESSION: 1. Low lung volumes without radiographic evidence of acute cardiopulmonary disease. Electronically Signed   By: Vinnie Langton M.D.   On: 04/09/2022 07:39   DG Chest Port 1 View  Result Date: 04/04/2022 CLINICAL DATA:  Shortness of breath for 4 days. EXAM: PORTABLE CHEST 1 VIEW COMPARISON:  01/07/2022 FINDINGS: 0510 hours. Cardiopericardial silhouette is at upper limits of normal for size. The lungs are clear without focal pneumonia, edema, pneumothorax or pleural effusion. Evaluation of lung parenchyma somewhat limited by technique and overlying soft tissue. Telemetry leads overlie the chest. IMPRESSION: No active disease. Electronically Signed   By: Misty Stanley M.D.   On: 04/04/2022 06:11   (Echo, Carotid, EGD, Colonoscopy, ERCP)    Subjective: No complains  Discharge Exam: Vitals:   04/13/22 0421 04/13/22 0816  BP: (!) 134/91 (!) 138/90  Pulse: 81 78  Resp: 16 15  Temp: 97.9 F (36.6 C) 98.4 F (36.9 C)  SpO2: 100% 96%   Vitals:   04/12/22 1946 04/12/22 1956 04/13/22 0421 04/13/22 0816  BP: 129/87  (!) 134/91 (!) 138/90  Pulse: 78  81 78  Resp: _0 Temp: 97.7 F (36.5 C)  97.9 F (36.6 C) 98.4 F (36.9 C)  TempSrc: Oral  Oral Oral  SpO2: 100% 100% 100% 96%  Weight:      Height:        General: Pt is alert, awake, not in acute distress Cardiovascular: RRR, S1/S2 +, no rubs, no gallops Respiratory: CTA bilaterally, no wheezing, no rhonchi Abdominal: Soft, NT, ND, bowel sounds + Extremities: no edema, no cyanosis    The results of significant diagnostics from this hospitalization (including imaging, microbiology, ancillary and laboratory) are listed below for reference.     Microbiology: Recent Results (from the past 240 hour(s))  Resp Panel by RT-PCR (Flu A&B, Covid) Anterior Nasal Swab     Status: None   Collection Time: 04/04/22  5:35 AM   Specimen:  Anterior Nasal Swab  Result Value Ref Range Status   SARS Coronavirus 2 by RT PCR NEGATIVE NEGATIVE Final    Comment: (NOTE) SARS-CoV-2 target nucleic acids are NOT DETECTED.  The SARS-CoV-2 RNA is generally detectable in upper respiratory specimens during the acute phase of infection. The lowest concentration of SARS-CoV-2 viral copies this assay can detect is 138 copies/mL. A negative result does not preclude SARS-Cov-2 infection and should not be used as the sole basis for treatment or other patient management decisions. A negative result may occur with  improper specimen collection/handling, submission of specimen  other than nasopharyngeal swab, presence of viral mutation(s) within the areas targeted by this assay, and inadequate number of viral copies(<138 copies/mL). A negative result must be combined with clinical observations, patient history, and epidemiological information. The expected result is Negative.  Fact Sheet for Patients:  EntrepreneurPulse.com.au  Fact Sheet for Healthcare Providers:  IncredibleEmployment.be  This test is no t yet approved or cleared by the [place] FDA and  has been authorized for detection and/or diagnosis of SARS-CoV-2 by FDA under an Emergency Use Authorization (EUA). This EUA will remain  in effect (meaning this test can be used) for the duration of the COVID-19 declaration under Section 564(b)(1) of the Act, 21 U.S.C.section 360bbb-3(b)(1), unless the authorization is terminated  or revoked sooner.       Influenza A by PCR NEGATIVE NEGATIVE Final   Influenza B by PCR NEGATIVE NEGATIVE Final    Comment: (NOTE) The Xpert Xpress SARS-CoV-2/FLU/RSV plus assay is intended as an aid in the diagnosis of influenza from Nasopharyngeal swab specimens and should not be used as a sole basis for treatment. Nasal washings and aspirates are unacceptable for Xpert Xpress SARS-CoV-2/FLU/RSV testing.  Fact  Sheet for Patients: EntrepreneurPulse.com.au  Fact Sheet for Healthcare Providers: IncredibleEmployment.be  This test is not yet approved or cleared by the [place] FDA and has been authorized for detection and/or diagnosis of SARS-CoV-2 by FDA under an Emergency Use Authorization (EUA). This EUA will remain in effect (meaning this test can be used) for the duration of the COVID-19 declaration under Section 564(b)(1) of the Act, 21 U.S.C. section 360bbb-3(b)(1), unless the authorization is terminated or revoked.  Performed at [facility], [place] [address], [place], [place] [address]   Respiratory (~20 pathogens) panel by PCR     Status: None   Collection Time: 04/04/22  5:35 AM   Specimen: Nasopharyngeal Swab; Respiratory  Result Value Ref Range Status   Adenovirus NOT DETECTED NOT DETECTED Final   Coronavirus 229E NOT DETECTED NOT DETECTED Final    Comment: (NOTE) The Coronavirus on the Respiratory Panel, DOES NOT test for the novel  Coronavirus (2019 nCoV)    Coronavirus HKU1 NOT DETECTED NOT DETECTED Final   Coronavirus NL63 NOT DETECTED NOT DETECTED Final   Coronavirus OC43 NOT DETECTED NOT DETECTED Final   Metapneumovirus NOT DETECTED NOT DETECTED Final   Rhinovirus / Enterovirus NOT DETECTED NOT DETECTED Final   Influenza A NOT DETECTED NOT DETECTED Final   Influenza B NOT DETECTED NOT DETECTED Final   Parainfluenza Virus 1 NOT DETECTED NOT DETECTED Final   Parainfluenza Virus 2 NOT DETECTED NOT DETECTED Final   Parainfluenza Virus 3 NOT DETECTED NOT DETECTED Final   Parainfluenza Virus 4 NOT DETECTED NOT DETECTED Final   Respiratory Syncytial Virus NOT DETECTED NOT DETECTED Final   Bordetella pertussis NOT DETECTED NOT DETECTED Final   Bordetella Parapertussis NOT DETECTED NOT DETECTED Final   Chlamydophila pneumoniae NOT DETECTED NOT DETECTED Final   Mycoplasma pneumoniae NOT DETECTED NOT DETECTED Final     Comment: Performed at [facility], [place]. [address], [place], [place] [address]     Labs: BNP (last 3 results) Recent Labs    10/23/21 1632 01/07/22 2342 04/04/22 0535  BNP 18.9 26.6 82.9   Basic Metabolic Panel: Recent Labs  Lab 04/09/22 0724 04/09/22 0809 04/13/22 0430  NA 141 141 140  K 3.2* 3.3* 3.5  CL 108  --  109  CO2 22  --  23  GLUCOSE 122*  --  100*  BUN 16  --  19  CREATININE 1.08*  --  1.01*  CALCIUM 8.2*  --  8.4*   Liver Function Tests: No results for input(s): "AST", "ALT", "ALKPHOS", "BILITOT", "PROT", "ALBUMIN" in the last 168 hours. No results for input(s): "LIPASE", "AMYLASE" in the last 168 hours. No results for input(s): "AMMONIA" in the last 168 hours. CBC: Recent Labs  Lab 04/09/22 0724 04/09/22 0809  WBC 19.4*  --   HGB 12.4 13.6  HCT 39.5 40.0  MCV 82.0  --   PLT 479*  --    Cardiac Enzymes: No results for input(s): "CKTOTAL", "CKMB", "CKMBINDEX", "TROPONINI" in the last 168 hours. BNP: Invalid input(s): "POCBNP" CBG: No results for input(s): "GLUCAP" in the last 168 hours. D-Dimer No results for input(s): "DDIMER" in the last 72 hours. Hgb A1c No results for input(s): "HGBA1C" in the last 72 hours. Lipid Profile No results for input(s): "CHOL", "HDL", "LDLCALC", "TRIG", "CHOLHDL", "LDLDIRECT" in the last 72 hours. Thyroid function studies No results for input(s): "TSH", "T4TOTAL", "T3FREE", "THYROIDAB" in the last 72 hours.  Invalid input(s): "FREET3" Anemia work up No results for input(s): "VITAMINB12", "FOLATE", "FERRITIN", "TIBC", "IRON", "RETICCTPCT" in the last 72 hours. Urinalysis    Component Value Date/Time   COLORURINE YELLOW 10/23/2021 1826   APPEARANCEUR CLOUDY (A) 10/23/2021 1826   LABSPEC 1.027 10/23/2021 1826   PHURINE 5.0 10/23/2021 1826   GLUCOSEU NEGATIVE 10/23/2021 1826   GLUCOSEU NEG mg/dL 10/28/2007 2049   HGBUR NEGATIVE 10/23/2021 1826   BILIRUBINUR NEGATIVE 10/23/2021 1826   KETONESUR NEGATIVE  10/23/2021 1826   PROTEINUR NEGATIVE 10/23/2021 1826   UROBILINOGEN 1.0 11/21/2014 0707   NITRITE NEGATIVE 10/23/2021 1826   LEUKOCYTESUR LARGE (A) 10/23/2021 1826   Sepsis Labs Recent Labs  Lab 04/09/22 0724  WBC 19.4*   Microbiology Recent Results (from the past 240 hour(s))  Resp Panel by RT-PCR (Flu A&B, Covid) Anterior Nasal Swab     Status: None   Collection Time: 04/04/22  5:35 AM   Specimen: Anterior Nasal Swab  Result Value Ref Range Status   SARS Coronavirus 2 by RT PCR NEGATIVE NEGATIVE Final    Comment: (NOTE) SARS-CoV-2 target nucleic acids are NOT DETECTED.  The SARS-CoV-2 RNA is generally detectable in upper respiratory specimens during the acute phase of infection. The lowest concentration of SARS-CoV-2 viral copies this assay can detect is 138 copies/mL. A negative result does not preclude SARS-Cov-2 infection and should not be used as the sole basis for treatment or other patient management decisions. A negative result may occur with  improper specimen collection/handling, submission of specimen other than nasopharyngeal swab, presence of viral mutation(s) within the areas targeted by this assay, and inadequate number of viral copies(<138 copies/mL). A negative result must be combined with clinical observations, patient history, and epidemiological information. The expected result is Negative.  Fact Sheet for Patients:  EntrepreneurPulse.com.au  Fact Sheet for Healthcare Providers:  IncredibleEmployment.be  This test is no t yet approved or cleared by the [place] FDA and  has been authorized for detection and/or diagnosis of SARS-CoV-2 by FDA under an Emergency Use Authorization (EUA). This EUA will remain  in effect (meaning this test can be used) for the duration of the COVID-19 declaration under Section 564(b)(1) of the Act, 21 U.S.C.section 360bbb-3(b)(1), unless the authorization is terminated  or revoked  sooner.       Influenza A by PCR NEGATIVE NEGATIVE Final   Influenza B by PCR NEGATIVE NEGATIVE Final  Comment: (NOTE) The Xpert Xpress SARS-CoV-2/FLU/RSV plus assay is intended as an aid in the diagnosis of influenza from Nasopharyngeal swab specimens and should not be used as a sole basis for treatment. Nasal washings and aspirates are unacceptable for Xpert Xpress SARS-CoV-2/FLU/RSV testing.  Fact Sheet for Patients: EntrepreneurPulse.com.au  Fact Sheet for Healthcare Providers: IncredibleEmployment.be  This test is not yet approved or cleared by the [place] FDA and has been authorized for detection and/or diagnosis of SARS-CoV-2 by FDA under an Emergency Use Authorization (EUA). This EUA will remain in effect (meaning this test can be used) for the duration of the COVID-19 declaration under Section 564(b)(1) of the Act, 21 U.S.C. section 360bbb-3(b)(1), unless the authorization is terminated or revoked.  Performed at [facility], [place] [address], [place], [place] [address]   Respiratory (~20 pathogens) panel by PCR     Status: None   Collection Time: 04/04/22  5:35 AM   Specimen: Nasopharyngeal Swab; Respiratory  Result Value Ref Range Status   Adenovirus NOT DETECTED NOT DETECTED Final   Coronavirus 229E NOT DETECTED NOT DETECTED Final    Comment: (NOTE) The Coronavirus on the Respiratory Panel, DOES NOT test for the novel  Coronavirus (2019 nCoV)    Coronavirus HKU1 NOT DETECTED NOT DETECTED Final   Coronavirus NL63 NOT DETECTED NOT DETECTED Final   Coronavirus OC43 NOT DETECTED NOT DETECTED Final   Metapneumovirus NOT DETECTED NOT DETECTED Final   Rhinovirus / Enterovirus NOT DETECTED NOT DETECTED Final   Influenza A NOT DETECTED NOT DETECTED Final   Influenza B NOT DETECTED NOT DETECTED Final   Parainfluenza Virus 1 NOT DETECTED NOT DETECTED Final   Parainfluenza Virus 2 NOT DETECTED NOT DETECTED  Final   Parainfluenza Virus 3 NOT DETECTED NOT DETECTED Final   Parainfluenza Virus 4 NOT DETECTED NOT DETECTED Final   Respiratory Syncytial Virus NOT DETECTED NOT DETECTED Final   Bordetella pertussis NOT DETECTED NOT DETECTED Final   Bordetella Parapertussis NOT DETECTED NOT DETECTED Final   Chlamydophila pneumoniae NOT DETECTED NOT DETECTED Final   Mycoplasma pneumoniae NOT DETECTED NOT DETECTED Final    Comment: Performed at [facility], [place]. [address], [place], [place] [address]      SIGNED:   Charlynne Cousins, MD  [facility] 04/13/2022, 9:21 AM Pager   If 7PM-7AM, please contact night-coverage [email or web address] Password [email or web address]

## 2022-04-15 ENCOUNTER — Telehealth: Payer: Self-pay

## 2022-04-15 NOTE — Telephone Encounter (Signed)
From the discharge summary:  She stated she is feeling " much, much better."  She explained that a representative from [facility] came to see her in the hospital and had her sign papers for TCL program.  She is very anxious to get her own apartment and the TCL program will subsidize some of her rent.  she said she has all of her medications as well as a working nebulizer. Has home O2 but only uses it when needed    Scheduled to see Dr Margarita Rana- 05/13/2022.

## 2022-04-15 NOTE — Telephone Encounter (Addendum)
Transition Care Management Follow-up Telephone Call Date of discharge and from where: 04/13/2022, [facility] How have you been since you were released from the hospital? She stated she is feeling " much, much better." She explained that a representative from [facility] came to see her in the hospital and had her sign papers for TCL program.  She is very anxious to get her own apartment and the TCL program will subsidize some of her rent. Any questions or concerns? No  Items Reviewed: Did the pt receive and understand the discharge instructions provided? Yes  Medications obtained and verified? Yes - she said she has all of her medications as well as a working nebulizer. Other? No  Any new allergies since your discharge? No  Dietary orders reviewed? Yes Do you have support at home? Yes   Home Care and Equipment/Supplies: Were home health services ordered? no If so, what is the name of the agency? N/a  Has the agency set up a time to come to the patient's home? not applicable Were any new equipment or medical supplies ordered?  No What is the name of the medical supply agency? N/a Were you able to get the supplies/equipment? not applicable Do you have any questions related to the use of the equipment or supplies? No  Functional Questionnaire: (I = Independent and D = Dependent) ADLs: independent. Has home O2 but only uses it when needed   Follow up appointments reviewed:  PCP Hospital f/u appt confirmed? Yes  Scheduled to see Dr Margarita Rana- 05/13/2022. [facility] f/u appt confirmed? Yes  Scheduled to see Dr Annamaria Boots - 06/03/2022 but she said she will call him to schedule an appointment to be seen sooner.  Are transportation arrangements needed? No  If their condition worsens, is the pt aware to call PCP or go to the Emergency Dept.? Yes Was the patient provided with contact information for the PCP's office or ED? Yes Was to pt encouraged to call back with questions or concerns?  Yes

## 2022-04-16 ENCOUNTER — Other Ambulatory Visit (HOSPITAL_COMMUNITY): Payer: Self-pay

## 2022-04-16 ENCOUNTER — Telehealth: Payer: Self-pay | Admitting: Pharmacist

## 2022-04-16 ENCOUNTER — Other Ambulatory Visit: Payer: Self-pay

## 2022-04-16 NOTE — Telephone Encounter (Signed)
Submitted a Prior Authorization RENEWAL request to [facility] for TEZSPIRE via CoverMyMeds. Will update once we receive a response.  Key: [id number]

## 2022-04-17 ENCOUNTER — Ambulatory Visit (HOSPITAL_COMMUNITY): Payer: Medicare Other | Admitting: Psychiatry

## 2022-04-17 ENCOUNTER — Encounter: Payer: Self-pay | Admitting: Family Medicine

## 2022-04-17 ENCOUNTER — Other Ambulatory Visit: Payer: Self-pay

## 2022-04-17 NOTE — Telephone Encounter (Signed)
Submitted an URGENT appeal to [facility] for April [name]. 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 Attached CBC and Prescribing info for both Delene Loll  Reference # [id number] Phone: [phone] Fax: [phone]  [name], PharmD, MPH, BCPS, CPP Clinical Pharmacist (Rheumatology and Pulmonology)

## 2022-04-17 NOTE — Telephone Encounter (Signed)
Received a fax regarding Prior Authorization from [facility] for April [name]. Authorization has been DENIED because\ (1) You need to try two (2) of these covered drugs: (a) Fasenra or [place] autoinjector*. (b) Nucala*. (2) OR your doctor needs to give [place] specific medical reasons why two (2) of the covered drug(s) are not appropriate for you..  Will need to appeal to prevent interruption in treatment  [name], PharmD, MPH, BCPS, CPP Clinical Pharmacist (Rheumatology and Pulmonology)

## 2022-04-28 ENCOUNTER — Encounter (HOSPITAL_COMMUNITY): Payer: Self-pay | Admitting: Internal Medicine

## 2022-04-28 ENCOUNTER — Inpatient Hospital Stay (HOSPITAL_COMMUNITY)
Admission: EM | Admit: 2022-04-28 | Discharge: 2022-05-02 | DRG: 190 | Disposition: A | Discharge status: Home / Self Care | Payer: Medicare Other | Attending: Family Medicine | Admitting: Family Medicine

## 2022-04-28 ENCOUNTER — Emergency Department (HOSPITAL_COMMUNITY): Payer: Medicare Other

## 2022-04-28 ENCOUNTER — Other Ambulatory Visit: Payer: Self-pay

## 2022-04-28 DIAGNOSIS — K219 Gastro-esophageal reflux disease without esophagitis: Secondary | ICD-10-CM | POA: Diagnosis present

## 2022-04-28 DIAGNOSIS — E038 Other specified hypothyroidism: Secondary | ICD-10-CM | POA: Diagnosis not present

## 2022-04-28 DIAGNOSIS — Z91041 Radiographic dye allergy status: Secondary | ICD-10-CM

## 2022-04-28 DIAGNOSIS — Z6841 Body Mass Index (BMI) 40.0 and over, adult: Secondary | ICD-10-CM

## 2022-04-28 DIAGNOSIS — N920 Excessive and frequent menstruation with regular cycle: Secondary | ICD-10-CM | POA: Diagnosis present

## 2022-04-28 DIAGNOSIS — I5032 Chronic diastolic (congestive) heart failure: Secondary | ICD-10-CM | POA: Diagnosis present

## 2022-04-28 DIAGNOSIS — J45901 Unspecified asthma with (acute) exacerbation: Secondary | ICD-10-CM | POA: Diagnosis present

## 2022-04-28 DIAGNOSIS — Z1152 Encounter for screening for COVID-19: Secondary | ICD-10-CM | POA: Diagnosis not present

## 2022-04-28 DIAGNOSIS — F32A Depression, unspecified: Secondary | ICD-10-CM | POA: Diagnosis present

## 2022-04-28 DIAGNOSIS — F419 Anxiety disorder, unspecified: Secondary | ICD-10-CM | POA: Diagnosis present

## 2022-04-28 DIAGNOSIS — G4733 Obstructive sleep apnea (adult) (pediatric): Secondary | ICD-10-CM

## 2022-04-28 DIAGNOSIS — G894 Chronic pain syndrome: Secondary | ICD-10-CM | POA: Diagnosis present

## 2022-04-28 DIAGNOSIS — Z79899 Other long term (current) drug therapy: Secondary | ICD-10-CM | POA: Diagnosis not present

## 2022-04-28 DIAGNOSIS — D509 Iron deficiency anemia, unspecified: Secondary | ICD-10-CM | POA: Diagnosis present

## 2022-04-28 DIAGNOSIS — J441 Chronic obstructive pulmonary disease with (acute) exacerbation: Secondary | ICD-10-CM | POA: Diagnosis present

## 2022-04-28 DIAGNOSIS — Z8249 Family history of ischemic heart disease and other diseases of the circulatory system: Secondary | ICD-10-CM

## 2022-04-28 DIAGNOSIS — E662 Morbid (severe) obesity with alveolar hypoventilation: Secondary | ICD-10-CM | POA: Diagnosis present

## 2022-04-28 DIAGNOSIS — M069 Rheumatoid arthritis, unspecified: Secondary | ICD-10-CM | POA: Diagnosis present

## 2022-04-28 DIAGNOSIS — I10 Essential (primary) hypertension: Secondary | ICD-10-CM | POA: Diagnosis not present

## 2022-04-28 DIAGNOSIS — Z7901 Long term (current) use of anticoagulants: Secondary | ICD-10-CM | POA: Diagnosis not present

## 2022-04-28 DIAGNOSIS — T380X5A Adverse effect of glucocorticoids and synthetic analogues, initial encounter: Secondary | ICD-10-CM | POA: Diagnosis not present

## 2022-04-28 DIAGNOSIS — I480 Paroxysmal atrial fibrillation: Secondary | ICD-10-CM

## 2022-04-28 DIAGNOSIS — D72829 Elevated white blood cell count, unspecified: Secondary | ICD-10-CM | POA: Diagnosis not present

## 2022-04-28 DIAGNOSIS — R7303 Prediabetes: Secondary | ICD-10-CM | POA: Diagnosis present

## 2022-04-28 DIAGNOSIS — Z91199 Patient's noncompliance with other medical treatment and regimen due to unspecified reason: Secondary | ICD-10-CM | POA: Diagnosis not present

## 2022-04-28 DIAGNOSIS — M170 Bilateral primary osteoarthritis of knee: Secondary | ICD-10-CM | POA: Diagnosis present

## 2022-04-28 DIAGNOSIS — Z87891 Personal history of nicotine dependence: Secondary | ICD-10-CM

## 2022-04-28 DIAGNOSIS — I110 Hypertensive heart disease with heart failure: Secondary | ICD-10-CM | POA: Diagnosis present

## 2022-04-28 DIAGNOSIS — E039 Hypothyroidism, unspecified: Secondary | ICD-10-CM | POA: Diagnosis present

## 2022-04-28 DIAGNOSIS — Z79890 Hormone replacement therapy (postmenopausal): Secondary | ICD-10-CM

## 2022-04-28 DIAGNOSIS — Z825 Family history of asthma and other chronic lower respiratory diseases: Secondary | ICD-10-CM | POA: Diagnosis not present

## 2022-04-28 DIAGNOSIS — J9621 Acute and chronic respiratory failure with hypoxia: Secondary | ICD-10-CM | POA: Diagnosis present

## 2022-04-28 DIAGNOSIS — E872 Acidosis, unspecified: Secondary | ICD-10-CM | POA: Diagnosis not present

## 2022-04-28 DIAGNOSIS — R0600 Dyspnea, unspecified: Principal | ICD-10-CM

## 2022-04-28 LAB — BASIC METABOLIC PANEL
Anion gap: 4 — ABNORMAL LOW (ref 5–15)
BUN: 6 mg/dL (ref 6–20)
CO2: 22 mmol/L (ref 22–32)
Calcium: 8.7 mg/dL — ABNORMAL LOW (ref 8.9–10.3)
Chloride: 110 mmol/L (ref 98–111)
Creatinine, Ser: 0.73 mg/dL (ref 0.44–1.00)
GFR, Estimated: >60 mL/min (ref >60)
Glucose, Bld: 113 mg/dL — ABNORMAL HIGH (ref 70–99)
Potassium: 3.7 mmol/L (ref 3.5–5.1)
Sodium: 136 mmol/L (ref 135–145)

## 2022-04-28 LAB — CBC WITH DIFFERENTIAL/PLATELET
Abs Immature Granulocytes: 0.07 10*3/uL (ref 0.00–0.07)
Basophils Absolute: 0 10*3/uL (ref 0.0–0.1)
Basophils Relative: 0 %
Eosinophils Absolute: 0.4 10*3/uL (ref 0.0–0.5)
Eosinophils Relative: 3 %
HCT: 37.1 % (ref 36.0–46.0)
Hemoglobin: 10.8 g/dL — ABNORMAL LOW (ref 12.0–15.0)
Immature Granulocytes: 1 %
Lymphocytes Relative: 43 %
Lymphs Abs: 6.6 10*3/uL — ABNORMAL HIGH (ref 0.7–4.0)
MCH: 25.8 pg — ABNORMAL LOW (ref 26.0–34.0)
MCHC: 29.1 g/dL — ABNORMAL LOW (ref 30.0–36.0)
MCV: 88.5 fL (ref 80.0–100.0)
Monocytes Absolute: 1.3 10*3/uL — ABNORMAL HIGH (ref 0.1–1.0)
Monocytes Relative: 9 %
Neutro Abs: 6.5 10*3/uL (ref 1.7–7.7)
Neutrophils Relative %: 44 %
Platelets: 350 10*3/uL (ref 150–400)
RBC: 4.19 MIL/uL (ref 3.87–5.11)
RDW: 19.2 % — ABNORMAL HIGH (ref 11.5–15.5)
WBC: 14.8 10*3/uL — ABNORMAL HIGH (ref 4.0–10.5)
nRBC: 0 % (ref 0.0–0.2)

## 2022-04-28 LAB — TROPONIN I (HIGH SENSITIVITY)
Troponin I (High Sensitivity): 4 ng/L (ref <18)
Troponin I (High Sensitivity): 4 ng/L (ref <18)

## 2022-04-28 LAB — I-STAT VENOUS BLOOD GAS, ED
Acid-base deficit: 8 mmol/L — ABNORMAL HIGH (ref 0.0–2.0)
Bicarbonate: 18.2 mmol/L — ABNORMAL LOW (ref 20.0–28.0)
Calcium, Ion: 1.21 mmol/L (ref 1.15–1.40)
HCT: 35 % — ABNORMAL LOW (ref 36.0–46.0)
Hemoglobin: 11.9 g/dL — ABNORMAL LOW (ref 12.0–15.0)
O2 Saturation: 64 %
Potassium: 3.6 mmol/L (ref 3.5–5.1)
Sodium: 139 mmol/L (ref 135–145)
TCO2: 19 mmol/L — ABNORMAL LOW (ref 22–32)
pCO2, Ven: 40.1 mmHg — ABNORMAL LOW (ref 44–60)
pH, Ven: 7.265 (ref 7.25–7.43)
pO2, Ven: 38 mmHg (ref 32–45)

## 2022-04-28 LAB — RESP PANEL BY RT-PCR (RSV, FLU A&B, COVID)  RVPGX2
Influenza A by PCR: NEGATIVE
Influenza B by PCR: NEGATIVE
Resp Syncytial Virus by PCR: NEGATIVE
SARS Coronavirus 2 by RT PCR: NEGATIVE

## 2022-04-28 LAB — PROCALCITONIN: Procalcitonin: <0.1 ng/mL

## 2022-04-28 LAB — BRAIN NATRIURETIC PEPTIDE: B Natriuretic Peptide: 58.9 pg/mL (ref 0.0–100.0)

## 2022-04-28 MED ORDER — SODIUM CHLORIDE 0.9 % IV SOLN
2.0000 g | INTRAVENOUS | Status: DC
  Administered 2022-04-28 – 2022-04-29 (×2): 2 g via INTRAVENOUS
  Filled 2022-04-28 (×2): qty 20

## 2022-04-28 MED ORDER — FAMOTIDINE 20 MG PO TABS
40.0000 mg | ORAL_TABLET | Freq: Every day | ORAL | Status: DC
  Administered 2022-04-28 – 2022-05-02 (×5): 40 mg via ORAL
  Filled 2022-04-28 (×5): qty 2

## 2022-04-28 MED ORDER — IPRATROPIUM-ALBUTEROL 0.5-2.5 (3) MG/3ML IN SOLN
3.0000 mL | Freq: Once | RESPIRATORY_TRACT | Status: AC
  Administered 2022-04-28: 3 mL via RESPIRATORY_TRACT
  Filled 2022-04-28: qty 3

## 2022-04-28 MED ORDER — ALBUTEROL SULFATE (2.5 MG/3ML) 0.083% IN NEBU
2.5000 mg | INHALATION_SOLUTION | RESPIRATORY_TRACT | Status: DC | PRN
  Administered 2022-04-29 – 2022-05-01 (×4): 2.5 mg via RESPIRATORY_TRACT
  Filled 2022-04-28 (×4): qty 3

## 2022-04-28 MED ORDER — ACETAMINOPHEN 325 MG PO TABS
650.0000 mg | ORAL_TABLET | Freq: Four times a day (QID) | ORAL | Status: DC | PRN
  Administered 2022-04-29 – 2022-05-01 (×3): 650 mg via ORAL
  Filled 2022-04-28 (×3): qty 2

## 2022-04-28 MED ORDER — TOPIRAMATE 100 MG PO TABS
200.0000 mg | ORAL_TABLET | Freq: Every day | ORAL | Status: DC
  Administered 2022-04-28 – 2022-05-01 (×4): 200 mg via ORAL
  Filled 2022-04-28 (×4): qty 2
  Filled 2022-04-28: qty 8

## 2022-04-28 MED ORDER — OXYCODONE HCL 5 MG PO TABS
10.0000 mg | ORAL_TABLET | Freq: Every day | ORAL | Status: DC | PRN
  Administered 2022-04-28 – 2022-05-02 (×10): 10 mg via ORAL
  Filled 2022-04-28 (×10): qty 2

## 2022-04-28 MED ORDER — IPRATROPIUM-ALBUTEROL 0.5-2.5 (3) MG/3ML IN SOLN
3.0000 mL | Freq: Four times a day (QID) | RESPIRATORY_TRACT | Status: DC
  Administered 2022-04-28 – 2022-04-29 (×5): 3 mL via RESPIRATORY_TRACT
  Filled 2022-04-28 (×5): qty 3

## 2022-04-28 MED ORDER — ZOLPIDEM TARTRATE 5 MG PO TABS
10.0000 mg | ORAL_TABLET | Freq: Every day | ORAL | Status: DC
  Administered 2022-04-28 – 2022-05-01 (×4): 10 mg via ORAL
  Filled 2022-04-28 (×4): qty 2

## 2022-04-28 MED ORDER — CLONAZEPAM 0.5 MG PO TABS: 0.5000 mg | ORAL_TABLET | Freq: Two times a day (BID) | ORAL | Status: DC | PRN

## 2022-04-28 MED ORDER — GUAIFENESIN ER 600 MG PO TB12
600.0000 mg | ORAL_TABLET | Freq: Two times a day (BID) | ORAL | Status: DC
  Administered 2022-04-28 (×2): 600 mg via ORAL
  Filled 2022-04-28 (×2): qty 1

## 2022-04-28 MED ORDER — AMLODIPINE BESYLATE 10 MG PO TABS
10.0000 mg | ORAL_TABLET | Freq: Every day | ORAL | Status: DC
  Administered 2022-04-28 – 2022-05-02 (×5): 10 mg via ORAL
  Filled 2022-04-28 (×2): qty 1
  Filled 2022-04-28 (×2): qty 2
  Filled 2022-04-28: qty 1

## 2022-04-28 MED ORDER — ACETAMINOPHEN 650 MG RE SUPP: 650.0000 mg | Freq: Four times a day (QID) | RECTAL | Status: DC | PRN

## 2022-04-28 MED ORDER — LEVOTHYROXINE SODIUM 50 MCG PO TABS
50.0000 ug | ORAL_TABLET | Freq: Every day | ORAL | Status: DC
  Administered 2022-04-29 – 2022-05-02 (×4): 50 ug via ORAL
  Filled 2022-04-28: qty 2
  Filled 2022-04-28 (×3): qty 1

## 2022-04-28 MED ORDER — ACETAMINOPHEN 325 MG PO TABS: 325.0000 mg | ORAL_TABLET | Freq: Every day | ORAL | Status: DC | PRN

## 2022-04-28 MED ORDER — PREGABALIN 75 MG PO CAPS
300.0000 mg | ORAL_CAPSULE | Freq: Three times a day (TID) | ORAL | Status: DC
  Administered 2022-04-28 – 2022-05-02 (×12): 300 mg via ORAL
  Filled 2022-04-28 (×6): qty 4
  Filled 2022-04-28 (×2): qty 3
  Filled 2022-04-28 (×3): qty 4
  Filled 2022-04-28: qty 3

## 2022-04-28 MED ORDER — ONDANSETRON HCL 4 MG/2ML IJ SOLN: 4.0000 mg | Freq: Four times a day (QID) | INTRAMUSCULAR | Status: DC | PRN

## 2022-04-28 MED ORDER — FLUTICASONE FUROATE-VILANTEROL 100-25 MCG/ACT IN AEPB
1.0000 | INHALATION_SPRAY | Freq: Every day | RESPIRATORY_TRACT | Status: DC
  Filled 2022-04-28: qty 28

## 2022-04-28 MED ORDER — ACETAMINOPHEN 500 MG PO TABS
1000.0000 mg | ORAL_TABLET | Freq: Once | ORAL | Status: AC
  Administered 2022-04-28: 1000 mg via ORAL
  Filled 2022-04-28: qty 2

## 2022-04-28 MED ORDER — ONDANSETRON HCL 4 MG PO TABS: 4.0000 mg | ORAL_TABLET | Freq: Four times a day (QID) | ORAL | Status: DC | PRN

## 2022-04-28 MED ORDER — PREDNISONE 20 MG PO TABS
40.0000 mg | ORAL_TABLET | Freq: Every day | ORAL | Status: DC
  Administered 2022-04-29: 40 mg via ORAL
  Filled 2022-04-28: qty 2

## 2022-04-28 MED ORDER — BACLOFEN 10 MG PO TABS
20.0000 mg | ORAL_TABLET | Freq: Three times a day (TID) | ORAL | Status: DC | PRN
  Administered 2022-04-29: 40 mg via ORAL
  Filled 2022-04-28: qty 4

## 2022-04-28 MED ORDER — CARVEDILOL 6.25 MG PO TABS
6.2500 mg | ORAL_TABLET | Freq: Two times a day (BID) | ORAL | Status: DC
  Administered 2022-04-28 – 2022-05-02 (×8): 6.25 mg via ORAL
  Filled 2022-04-28: qty 1
  Filled 2022-04-28: qty 2
  Filled 2022-04-28 (×5): qty 1
  Filled 2022-04-28: qty 2

## 2022-04-28 MED ORDER — OXYCODONE-ACETAMINOPHEN 10-325 MG PO TABS: 1.0000 | ORAL_TABLET | Freq: Every day | ORAL | Status: DC | PRN

## 2022-04-28 MED ORDER — CLONIDINE HCL 0.1 MG PO TABS
0.2000 mg | ORAL_TABLET | Freq: Two times a day (BID) | ORAL | Status: DC
  Administered 2022-04-28 – 2022-05-02 (×8): 0.2 mg via ORAL
  Filled 2022-04-28: qty 1
  Filled 2022-04-28 (×3): qty 2
  Filled 2022-04-28: qty 1
  Filled 2022-04-28 (×3): qty 2

## 2022-04-28 NOTE — ED Notes (Signed)
SpO2 dropped to 89% while ambulating, increased wheeze noted and patient complaints of increased shortness of breath.

## 2022-04-28 NOTE — ED Provider Triage Note (Signed)
Emergency Medicine Provider Triage Evaluation Note  April [name] , a 51 y.o. female  was evaluated in triage.  Pt complains of sob. Report wheeze, cough, sob and fatigue x 1 week.  Hx of asthma, felt medications at home hasn't helped much.  No n/v/d  Review of Systems  Positive: As above Negative: As above  Physical Exam  LMP 03/14/2022 (Approximate)  Gen:   Awake, in moderate resp distress Resp:  tachypneic MSK:   Moves extremities without difficulty  Other:   Medical Decision Making  Medically screening exam initiated at 8:10 AM.  Appropriate orders placed.  April [name] was informed that the remainder of the evaluation will be completed by another provider, this initial triage assessment does not replace that evaluation, and the importance of remaining in the ED until their evaluation is complete.  Moderate resp distress, likely asthma exacerbation in the setting of viral illness.  Recommend moving to a treatment room promptly.  Pt is hypoxic, placed on BVM   Domenic Moras, PA-C 04/28/22 1165

## 2022-04-28 NOTE — H&P (Addendum)
History and Physical    Patient: April [name] YSA:[id number] DOB: 1970-11-10 DOA: 04/28/2022 DOS: the patient was seen and examined on 04/28/2022 PCP: [name], MD  Patient coming from: Home via EMS  Chief Complaint:  Chief Complaint  Patient presents with   Shortness of Breath   HPI: April [name] is a 51 y.o. female with medical history significant of  severe asthma/COPD, HTN, chronic diastolic CHF, PAF, anxiety/depression, morbid obesity, OSA, chronic pain, and RA presents with complaints of worsening shortness of breath over the last week.  She reports having a productive cough with congestion, rhinorrhea, and wheezing.  She had tried home medications breathing treatments and inhalers without improvement in symptoms.  Patient notes that recently she has been using 4 to 5 L of oxygen at night and 3 L during the day while she has been sick.  Denies having any significant fever or issues with bleeding, but has had subjective hot and cold flashes.  This the patient's third admission into the hospital with similar symptoms this month.  She questions if symptoms or onset by possible mold in the apartment where she is staying.  She had notified the landlord and states that she thinks they just painted over the area in the bathroom.  Furthermore she follows with Dr. Annamaria Boots of pulmonology in the outpatient setting and she thinks that ever since she stopped doing the [place] treatments in clinic she has been having more frequent exacerbations.  She states that she has not been around secondhand smoke or any recent sick contacts.  Patient reports that she cannot use the CPAP machine.  April [name] reports having pain all over for which she is on chronic pain medication.  In route with EMS patient was noted to have wheezing in upper lung fields.  She had been given DuoNeb breathing treatment and Solu-Medrol 120 mg IV prior to arrival.  In the emergency department patient was noted to be afebrile with  pulse 84-1 12, respirations 22-24, blood pressures 104/83 to 156/81, and O2 saturations currently maintained greater than 90% on 4 L of nasal cannula oxygen.  Labs significant for WBC 14.8, hemoglobin 10.8, and BNP 58.9.  Influenza, COVID-19, and RSV screen were negative.  Review of Systems: As mentioned in the history of present illness. All other systems reviewed and are negative. Past Medical History:  Diagnosis Date   Acanthosis nigricans    Anxiety    Arthritis    "knees" (04/28/2017)   Asthma    Followed by Dr. Annamaria Boots (pulmonology); receives every other week omalizumab injections; has frequent exacerbations   Back pain    Chronic diastolic CHF (congestive heart failure) ([place]) 01/17/2017   COPD (chronic obstructive pulmonary disease) ([place])    PFTs in 2002, FEV1/FVC 65, no post bronchodilater test done   Depression    GERD (gastroesophageal reflux disease)    Headache(784.0)    "q couple days" (32/35/5732)   Helicobacter pylori (H. pylori) infection    Hypertension, essential    Insomnia    Joint pain    Lower extremity edema    Menorrhagia    Morbid obesity ([place])    OSA on CPAP    Sleep study 2008 - mild OSA, not enough events to titrate CPAP; wears CPAP now/pt on 04/28/2017   Pneumonia X 1   Prediabetes    Rheumatoid arthritis (HCC)    Seasonal allergies    Shortness of breath    Tobacco user    Vitamin D deficiency  Past Surgical History:  Procedure Laterality Date   CARDIOVERSION N/A 05/30/2017   Procedure: CARDIOVERSION;  Surgeon: Sanda Klein, MD;  Location: [facility];  Service: Cardiovascular;  Laterality: N/A;   REDUCTION MAMMAPLASTY Bilateral 09/2011   TUBAL LIGATION  1996   bilateral   Social History:  reports that she quit smoking about 7 years ago. Her smoking use included cigarettes. She has a 13.00 pack-year smoking history. She has never used smokeless tobacco. She reports that she does not drink alcohol and does not use drugs.  Allergies   Allergen Reactions   Contrast Media [Iodinated Contrast Media] Itching    CT contrast. Unknown per PT.     Family History  Problem Relation Age of Onset   Hypertension Mother    Asthma Daughter    Cancer Paternal Aunt    Asthma Maternal Grandmother     Prior to Admission medications   Medication Sig Start Date End Date Taking? Authorizing Provider  albuterol (PROAIR HFA) 108 (90 Base) MCG/ACT inhaler Inhale 2 puffs into the lungs every 6 (six) hours as needed for wheezing or shortness of breath. Patient taking differently: Inhale 2 puffs into the lungs as needed for wheezing or shortness of breath. 11/13/21   Baird Lyons D, MD  amLODipine (NORVASC) 10 MG tablet Take 1 tablet (10 mg total) by mouth daily. 03/11/22   [name], MD  apixaban (ELIQUIS) 5 MG TABS tablet Take 1 tablet (5 mg total) by mouth 2 (two) times daily. 11/28/21   [name], MD  carvedilol (COREG) 6.25 MG tablet TAKE 1 TABLET(6.25 MG) BY MOUTH TWICE DAILY WITH A MEAL Patient taking differently: Take 6.25 mg by mouth daily. 03/18/22   [name], MD  clonazePAM (KLONOPIN) 0.5 MG tablet Take 1 tablet (0.5 mg total) by mouth daily as needed for anxiety. Patient taking differently: Take 0.5 mg by mouth 2 (two) times daily as needed for anxiety. 10/18/21   Florencia Reasons, MD  cloNIDine (CATAPRES) 0.2 MG tablet Take 1 tablet (0.2 mg total) by mouth 2 (two) times daily. 03/11/22   [name], MD  EPINEPHrine 0.3 mg/0.3 mL IJ SOAJ injection Inject 0.3 mg into the muscle as needed for anaphylaxis. 05/30/20   Deneise Lever, MD  famotidine (PEPCID) 40 MG tablet Take 40 mg by mouth daily. 01/25/22   [provider]  FLUoxetine (PROZAC) 20 MG capsule Take 1 capsule (20 mg total) by mouth daily. Take 1 capsule daily. 04/09/22   Armando Reichert, MD  fluticasone (FLONASE) 50 MCG/ACT nasal spray SHAKE LIQUID AND USE 1 SPRAY IN EACH NOSTRIL DAILY AS NEEDED FOR ALLERGIES OR RHINITIS Patient taking differently: Place  1 spray into both nostrils as needed for allergies. 08/05/21   [name], MD  Fluticasone-Umeclidin-Vilant 100-62.5-25 MCG/ACT AEPB Inhale 1 puff into the lungs daily. 10/26/21   Charlynne Cousins, MD  furosemide (LASIX) 20 MG tablet TAKE 1 TABLET(20 MG) BY MOUTH DAILY 04/12/22   [name], MD  ipratropium-albuterol (DUONEB) 0.5-2.5 (3) MG/3ML SOLN 1 neb every 6 hours if needed for asthma Patient taking differently: Take 3 mLs by nebulization every 4 (four) hours as needed (shortness of breath). 01/28/22   Deneise Lever, MD  levothyroxine (SYNTHROID) 50 MCG tablet Take 1 tablet (50 mcg total) by mouth daily. 11/28/21   [name], MD  naloxone [facility]) nasal spray 4 mg/0.1 mL Place 1 spray into the nose once as needed (opioid overdose). 08/09/20   [provider]  oxyCODONE-acetaminophen (PERCOCET) 10-325 MG tablet  Take 1 tablet by mouth 5 (five) times daily as needed for pain. 03/26/22     phentermine 15 MG capsule Take 15 mg by mouth daily. 08/10/21   [provider]  potassium chloride SA (KLOR-CON M) 20 MEQ tablet TAKE 1 TABLET(20 MEQ) BY MOUTH DAILY Patient taking differently: Take 20 mEq by mouth daily. 02/01/22   [name], MD  predniSONE (DELTASONE) 10 MG tablet Takes 6 tablets by mouth for 1 day, then 5 tablets for 1 day, then 4 tablets for 1 day, then 3 tablets for 1 day, then 2 tabs for 1 day, then 1 tab for 1 day, and then stop. 04/12/22   Charlynne Cousins, MD  pregabalin (LYRICA) 300 MG capsule Take 300 mg by mouth in the morning, at noon, and at bedtime. 01/08/21   [provider]  Semaglutide, 1 MG/DOSE, (OZEMPIC, 1 MG/DOSE,) 4 MG/3ML SOPN Inject 1 mg into the skin once a week.    [provider]  Tezepelumab-ekko (TEZSPIRE) 210 MG/1.91ML SOAJ Inject 210 mg into the skin every 28 (twenty-eight) days. 11/05/21   Baird Lyons D, MD  topiramate (TOPAMAX) 200 MG tablet Take 200 mg by mouth at bedtime. 01/08/21   [provider]  Vitamin D, Ergocalciferol, (DRISDOL) 1.25 MG (50000 UNIT) CAPS capsule Take 50,000 Units by mouth once a week. 11/30/21   [provider]  zolpidem (AMBIEN) 10 MG tablet TAKE 1 TABLET BY MOUTH AT BEDTIME AS NEEDED FOR SLEEP Patient taking differently: Take 10 mg by mouth at bedtime. 01/28/22   Young, Kasandra Knudsen, MD  mometasone-formoterol (DULERA) 200-5 MCG/ACT AERO Inhale 2 puffs into the lungs 2 (two) times daily. Dx: Asthma 04/08/19 08/12/19  Baird Lyons D, MD    Physical Exam: Vitals:   04/28/22 0804 04/28/22 0834 04/28/22 0835 04/28/22 0955  BP: (!) 154/91 104/83 104/83 (!) 156/81  Pulse: 100 84 (!) 112 99  Resp: (!) 22 (!) 23 (!) 24 (!) 22  Temp: 98.5 F (36.9 C)     TempSrc: Axillary     SpO2: 90%  100% 100%    Constitutional: Morbidly obese female currently in some respiratory distress Eyes: PERRL, lids and conjunctivae normal ENMT: Mucous membranes are moist.  Erythema of the nares appreciated Neck: normal, supple Respiratory: Decreased overall aeration with bilateral wheezing noted, significantly lower lung fields.  O2 saturations maintained on 4 L of oxygen. Cardiovascular: Regular rate and rhythm, no murmurs / rubs / gallops. No extremity edema.   Abdomen: no tenderness, no masses palpated. No hepatosplenomegaly. Bowel sounds positive.  Musculoskeletal: no clubbing / cyanosis. No joint deformity upper and lower extremities. muscle tone.  Skin: no rashes, lesions, ulcers. No induration Neurologic: CN 2-12 grossly intact.  Strength 5/5 in all 4.  Psychiatric: Normal judgment and insight. Alert and oriented x 3. Normal mood.   Data Reviewed:  EKG revealed normal sinus rhythm at 93 bpm.  Reviewed labs, imaging, and pertinent records as noted above in HPI  Assessment and Plan:  Respiratory failure with hypoxia secondary to asthma with COPD exacerbation Acute on chronic.  Patient presents with complaints of worsening shortness of breath with cough and  wheezing.  Noted to have significant wheezing and upper lung fields on physical exam.  O2 saturations currently maintained on 4 L nasal cannula oxygen.  Venous pH did not show any significant signs of hypercapnia.  Patient had received 125 mg of Solu-Medrol IV route with EMS and DuoNeb breathing treatments.  This is patient's third admission  for similar symptoms this month.  Unclear if provoked by reports of possible mold in her home. -Admit to a medical telemetry bed -COPD order set utilized -Continuous pulse oximetry with nasal cannula oxygen maintain O2 saturation greater than 92% -Check procalcitonin -DuoNeb's  4 times daily and as needed -Continue pharmacy substitution of Breo -Prednisone 40 mg daily -Rocephin 2 g IV -Recommend outpatient follow-up with Dr. Annamaria Boots of pulmonology in the outpatient setting   Leukocytosis Chronic.  WBC elevated at 14.8, but appears to be chronically elevated over the last several years.  Question if secondary to chronic pain medication or frequent steroids. -Continue to monitor  Hypochromic anemia Hemoglobin 10.8-> 11.9 g/dL with low MCH which appears slightly lower than previous baseline.  Patient denied any reports of bleeding. -Recheck blood counts in a.m.  Essential hypertension Blood pressures currently maintained. -Continue current home medication regimen   Paroxysmal atrial fibrillation on chronic anticoagulation Currently sinus rhythm. -Continue Coreg and Eliquis.  Chronic diastolic heart failure Patient appears to be compensated.  BNP 58.9.  Hypothyroidism TSH was noted to be 2.686 on 10/23/2021. -Continue levothyroxine   Anxiety and depression -Continue Prozac and clonazepam as needed  Obstructive of sleep apnea/Possibly obesity hypoventilation syndrome Patient has not been using CPAP at night.  Morbid obesity Patient on Ozempic and phentermine in the outpatient setting. -Continue phentermine of Ozempic in the outpatient  setting  DVT prophylaxis: Eliquis Advance Care Planning:   Code Status: Full Code   Consults: None  Family Communication: None requested  Severity of Illness: The appropriate patient status for this patient is INPATIENT. Inpatient status is judged to be reasonable and necessary in order to provide the required intensity of service to ensure the patient's safety. The patient's presenting symptoms, physical exam findings, and initial radiographic and laboratory data in the context of their chronic comorbidities is felt to place them at high risk for further clinical deterioration. Furthermore, it is not anticipated that the patient will be medically stable for discharge from the hospital within 2 midnights of admission.   * I certify that at the point of admission it is my clinical judgment that the patient will require inpatient hospital care spanning beyond 2 midnights from the point of admission due to high intensity of service, high risk for further deterioration and high frequency of surveillance required.*  Author: [name], MD 04/28/2022 10:00 AM  For on call review [email or web address].

## 2022-04-28 NOTE — ED Triage Notes (Signed)
Pt arrives via EMS from home with hx of COPD, reports SOB for the last week, worsened around 5am. Pt with wheezing in upper lung fields. 150/88, 92, rr 28, 96% on neb. '10mg'$  alb, 0.'5mg'$  atrovent, '125mg'$  solumedrol, 22g in RAC.

## 2022-04-28 NOTE — ED Notes (Signed)
Bedside commode in pt room. Pt up to commode without assistance. Diet gingerale provided to pt. No other needs requested at this time.

## 2022-04-28 NOTE — ED Provider Notes (Signed)
[place] EMERGENCY DEPARTMENT Provider Note   CSN: [id number] Arrival date & time: 04/28/22  0802     History  Chief Complaint  Patient presents with   Shortness of Breath    April [name] is a 51 y.o. female.  51 year old female with medical history as detailed below presents for evaluation for wheezing and shortness of breath.  Patient reports approximately 1 week of symptoms.  She reports increased shortness of breath and wheezing over the course the last 48 hours.  She denies fever.  She used home medications including nebs without improvement.  EMS noted significant wheezing on their initial evaluation.  Patient has already received 125 mg of Solu-Medrol, 10 mg of albuterol, 0.5 mg Atrovent prior to arrival.  Patient does report intermittent use of supplemental O2 at home.  Over the last 2 days she has increased her rate of supplemental O2 -she reports using approximately 4 to 5 L nasal cannula for the last 48 hours.  She typically will use up to 2 L nasal cannula overnight only.  The history is provided by the patient and medical records.       Home Medications Prior to Admission medications   Medication Sig Start Date End Date Taking? Authorizing Provider  albuterol (PROAIR HFA) 108 (90 Base) MCG/ACT inhaler Inhale 2 puffs into the lungs every 6 (six) hours as needed for wheezing or shortness of breath. Patient taking differently: Inhale 2 puffs into the lungs as needed for wheezing or shortness of breath. 11/13/21   Baird Lyons D, MD  amLODipine (NORVASC) 10 MG tablet Take 1 tablet (10 mg total) by mouth daily. 03/11/22   [name], MD  apixaban (ELIQUIS) 5 MG TABS tablet Take 1 tablet (5 mg total) by mouth 2 (two) times daily. 11/28/21   [name], MD  carvedilol (COREG) 6.25 MG tablet TAKE 1 TABLET(6.25 MG) BY MOUTH TWICE DAILY WITH A MEAL Patient taking differently: Take 6.25 mg by mouth daily. 03/18/22   [name], MD   clonazePAM (KLONOPIN) 0.5 MG tablet Take 1 tablet (0.5 mg total) by mouth daily as needed for anxiety. Patient taking differently: Take 0.5 mg by mouth 2 (two) times daily as needed for anxiety. 10/18/21   Florencia Reasons, MD  cloNIDine (CATAPRES) 0.2 MG tablet Take 1 tablet (0.2 mg total) by mouth 2 (two) times daily. 03/11/22   [name], MD  EPINEPHrine 0.3 mg/0.3 mL IJ SOAJ injection Inject 0.3 mg into the muscle as needed for anaphylaxis. 05/30/20   Deneise Lever, MD  famotidine (PEPCID) 40 MG tablet Take 40 mg by mouth daily. 01/25/22   [provider]  FLUoxetine (PROZAC) 20 MG capsule Take 1 capsule (20 mg total) by mouth daily. Take 1 capsule daily. 04/09/22   Armando Reichert, MD  fluticasone (FLONASE) 50 MCG/ACT nasal spray SHAKE LIQUID AND USE 1 SPRAY IN EACH NOSTRIL DAILY AS NEEDED FOR ALLERGIES OR RHINITIS Patient taking differently: Place 1 spray into both nostrils as needed for allergies. 08/05/21   [name], MD  Fluticasone-Umeclidin-Vilant 100-62.5-25 MCG/ACT AEPB Inhale 1 puff into the lungs daily. 10/26/21   Charlynne Cousins, MD  furosemide (LASIX) 20 MG tablet TAKE 1 TABLET(20 MG) BY MOUTH DAILY 04/12/22   [name], MD  ipratropium-albuterol (DUONEB) 0.5-2.5 (3) MG/3ML SOLN 1 neb every 6 hours if needed for asthma Patient taking differently: Take 3 mLs by nebulization every 4 (four) hours as needed (shortness of breath). 01/28/22   Deneise Lever, MD  levothyroxine (SYNTHROID) 50 MCG tablet Take 1 tablet (50 mcg total) by mouth daily. 11/28/21   [name], MD  naloxone [facility]) nasal spray 4 mg/0.1 mL Place 1 spray into the nose once as needed (opioid overdose). 08/09/20   [provider]  oxyCODONE-acetaminophen (PERCOCET) 10-325 MG tablet Take 1 tablet by mouth 5 (five) times daily as needed for pain. 03/26/22     phentermine 15 MG capsule Take 15 mg by mouth daily. 08/10/21   [provider]  potassium chloride SA (KLOR-CON M) 20 MEQ  tablet TAKE 1 TABLET(20 MEQ) BY MOUTH DAILY Patient taking differently: Take 20 mEq by mouth daily. 02/01/22   [name], MD  predniSONE (DELTASONE) 10 MG tablet Takes 6 tablets by mouth for 1 day, then 5 tablets for 1 day, then 4 tablets for 1 day, then 3 tablets for 1 day, then 2 tabs for 1 day, then 1 tab for 1 day, and then stop. 04/12/22   Charlynne Cousins, MD  pregabalin (LYRICA) 300 MG capsule Take 300 mg by mouth in the morning, at noon, and at bedtime. 01/08/21   [provider]  Semaglutide, 1 MG/DOSE, (OZEMPIC, 1 MG/DOSE,) 4 MG/3ML SOPN Inject 1 mg into the skin once a week.    [provider]  Tezepelumab-ekko (TEZSPIRE) 210 MG/1.91ML SOAJ Inject 210 mg into the skin every 28 (twenty-eight) days. 11/05/21   Baird Lyons D, MD  topiramate (TOPAMAX) 200 MG tablet Take 200 mg by mouth at bedtime. 01/08/21   [provider]  Vitamin D, Ergocalciferol, (DRISDOL) 1.25 MG (50000 UNIT) CAPS capsule Take 50,000 Units by mouth once a week. 11/30/21   [provider]  zolpidem (AMBIEN) 10 MG tablet TAKE 1 TABLET BY MOUTH AT BEDTIME AS NEEDED FOR SLEEP Patient taking differently: Take 10 mg by mouth at bedtime. 01/28/22   Young, Kasandra Knudsen, MD  mometasone-formoterol (DULERA) 200-5 MCG/ACT AERO Inhale 2 puffs into the lungs 2 (two) times daily. Dx: Asthma 04/08/19 08/12/19  Deneise Lever, MD      Allergies    Contrast media [iodinated contrast media]    Review of Systems   Review of Systems  All other systems reviewed and are negative.   Physical Exam Updated Vital Signs BP 104/83   Pulse (!) 112   Temp 98.5 F (36.9 C) (Axillary)   Resp (!) 24   LMP 03/14/2022 (Approximate)   SpO2 100%  Physical Exam Vitals and nursing note reviewed.  Constitutional:      General: She is not in acute distress.    Appearance: Normal appearance. She is well-developed.  HENT:     Head: Normocephalic and atraumatic.  Eyes:     Conjunctiva/sclera:  Conjunctivae normal.     Pupils: Pupils are equal, round, and reactive to light.  Cardiovascular:     Rate and Rhythm: Normal rate and regular rhythm.     Heart sounds: Normal heart sounds.  Pulmonary:     Effort: Pulmonary effort is normal. No respiratory distress.     Breath sounds: Examination of the right-upper field reveals wheezing. Examination of the left-upper field reveals wheezing. Examination of the right-middle field reveals wheezing. Examination of the left-middle field reveals wheezing. Examination of the right-lower field reveals wheezing. Examination of the left-lower field reveals wheezing. Wheezing present.     Comments: Diffuse scattered expiratory wheezes in all lung fields. Abdominal:     General: There is no distension.     Palpations: Abdomen is soft.  Tenderness: There is no abdominal tenderness.  Musculoskeletal:        General: No deformity. Normal range of motion.     Cervical back: Normal range of motion and neck supple.  Skin:    General: Skin is warm and dry.  Neurological:     General: No focal deficit present.     Mental Status: She is alert and oriented to person, place, and time.     ED Results / Procedures / Treatments   Labs (all labs ordered are listed, but only abnormal results are displayed) Labs Reviewed  BASIC METABOLIC PANEL - Abnormal; Notable for the following components:      Result Value   Glucose, Bld 113 (*)    Calcium 8.7 (*)    Anion gap 4 (*)    All other components within normal limits  CBC WITH DIFFERENTIAL/PLATELET - Abnormal; Notable for the following components:   WBC 14.8 (*)    Hemoglobin 10.8 (*)    MCH 25.8 (*)    MCHC 29.1 (*)    RDW 19.2 (*)    Lymphs Abs 6.6 (*)    Monocytes Absolute 1.3 (*)    All other components within normal limits  RESP PANEL BY RT-PCR (RSV, FLU A&B, COVID)  [id number]  BRAIN NATRIURETIC PEPTIDE  I-STAT VENOUS BLOOD GAS, ED  TROPONIN I (HIGH SENSITIVITY)    EKG EKG  Interpretation  Date/Time:  Sunday April 28 2022 08:37:48 EST Ventricular Rate:  93 PR Interval:  154 QRS Duration: 97 QT Interval:  376 QTC Calculation: 468 R Axis:   -29 Text Interpretation: Sinus rhythm Atrial premature complex Borderline left axis deviation Anteroseptal infarct, age indeterminate Confirmed by Dene Gentry ([id number]) on 04/28/2022 8:41:02 AM  Radiology DG Chest Port 1 View  Result Date: 04/28/2022 CLINICAL DATA:  51 year old female with history of shortness of breath. EXAM: PORTABLE CHEST 1 VIEW COMPARISON:  Chest x-ray 04/09/2022. FINDINGS: Lung volumes are low. No consolidative airspace disease. No pleural effusions. No pneumothorax. No pulmonary nodule or mass noted. Pulmonary vasculature and the cardiomediastinal silhouette are within normal limits. IMPRESSION: 1. Low lung volumes without radiographic evidence of acute cardiopulmonary disease. Electronically Signed   By: Vinnie Langton M.D.   On: 04/28/2022 08:40    Procedures Procedures    Medications Ordered in ED Medications  ipratropium-albuterol (DUONEB) 0.5-2.5 (3) MG/3ML nebulizer solution 3 mL (has no administration in time range)    ED Course/ Medical Decision Making/ A&P                           Medical Decision Making Risk Prescription drug management.    Medical Screen Complete  This patient presented to the ED with complaint of sob.  This complaint involves an extensive number of treatment options. The initial differential diagnosis includes, but is not limited to, copd/asthma exacerbation, pneumonia, etc  This presentation is: Acute, Chronic, Self-Limited, Previously Undiagnosed, Uncertain Prognosis, Complicated, Systemic Symptoms, and Threat to Life/Bodily Function   Patient with known history of asthma/COPD, morbid obesity presents with increased shortness of breath.  Patient is with wheezing throughout all lung fields.  Patient with increased FiO2 requirement.  Presentation  is consistent with likely asthma/COPD exacerbation.  Patient would benefit from admission for workup and treatment.  Hospitalist service made aware of case and will evaluate for admission.    Additional history obtained:  Additional history obtained from EMS External records from outside sources obtained and reviewed including prior  ED visits and prior Inpatient records.    Lab Tests:  I ordered and personally interpreted labs.  The pertinent results include:  CBC BMP Covid/Flu   Imaging Studies ordered:  I ordered imaging studies including CXR  I independently visualized and interpreted obtained imaging which showed NAD I agree with the radiologist interpretation.   Cardiac Monitoring:  The patient was maintained on a cardiac monitor.  I personally viewed and interpreted the cardiac monitor which showed an underlying rhythm of: sinus tach   Medicines ordered:  I ordered medication including duoneb  for bronchospasm  Reevaluation of the patient after these medicines showed that the patient: improved    Problem List / ED Course:  Dyspnea/Asthma exacerbation   Reevaluation:  After the interventions noted above, I reevaluated the patient and found that they have: improved   Disposition:  After consideration of the diagnostic results and the patients response to treatment, I feel that the patent would benefit from admission.          Final Clinical Impression(s) / ED Diagnoses Final diagnoses:  Dyspnea, unspecified type    Rx / DC Orders ED Discharge Orders     None         Valarie Merino, MD 04/28/22 [id number]

## 2022-04-29 ENCOUNTER — Other Ambulatory Visit: Payer: Self-pay | Admitting: Internal Medicine

## 2022-04-29 DIAGNOSIS — J441 Chronic obstructive pulmonary disease with (acute) exacerbation: Secondary | ICD-10-CM | POA: Diagnosis not present

## 2022-04-29 DIAGNOSIS — I10 Essential (primary) hypertension: Secondary | ICD-10-CM | POA: Diagnosis not present

## 2022-04-29 DIAGNOSIS — D509 Iron deficiency anemia, unspecified: Secondary | ICD-10-CM | POA: Diagnosis not present

## 2022-04-29 DIAGNOSIS — D72829 Elevated white blood cell count, unspecified: Secondary | ICD-10-CM | POA: Diagnosis not present

## 2022-04-29 LAB — RESPIRATORY PANEL BY PCR

## 2022-04-29 LAB — CBC
HCT: 34.2 % — ABNORMAL LOW (ref 36.0–46.0)
Hemoglobin: 10.2 g/dL — ABNORMAL LOW (ref 12.0–15.0)
MCH: 25.6 pg — ABNORMAL LOW (ref 26.0–34.0)
MCHC: 29.8 g/dL — ABNORMAL LOW (ref 30.0–36.0)
MCV: 85.7 fL (ref 80.0–100.0)
Platelets: 352 10*3/uL (ref 150–400)
RBC: 3.99 MIL/uL (ref 3.87–5.11)
RDW: 19.1 % — ABNORMAL HIGH (ref 11.5–15.5)
WBC: 14.2 10*3/uL — ABNORMAL HIGH (ref 4.0–10.5)
nRBC: 0 % (ref 0.0–0.2)

## 2022-04-29 LAB — BASIC METABOLIC PANEL
Anion gap: 10 (ref 5–15)
BUN: 14 mg/dL (ref 6–20)
CO2: 17 mmol/L — ABNORMAL LOW (ref 22–32)
Calcium: 8.4 mg/dL — ABNORMAL LOW (ref 8.9–10.3)
Chloride: 110 mmol/L (ref 98–111)
Creatinine, Ser: 0.89 mg/dL (ref 0.44–1.00)
GFR, Estimated: >60 mL/min (ref >60)
Glucose, Bld: 108 mg/dL — ABNORMAL HIGH (ref 70–99)
Potassium: 3.7 mmol/L (ref 3.5–5.1)
Sodium: 137 mmol/L (ref 135–145)

## 2022-04-29 LAB — MRSA NEXT GEN BY PCR, NASAL: MRSA by PCR Next Gen: NOT DETECTED

## 2022-04-29 MED ORDER — METHYLPREDNISOLONE SODIUM SUCC 125 MG IJ SOLR
60.0000 mg | Freq: Two times a day (BID) | INTRAMUSCULAR | Status: DC
  Administered 2022-04-29 – 2022-05-02 (×7): 60 mg via INTRAVENOUS
  Filled 2022-04-29 (×7): qty 2

## 2022-04-29 MED ORDER — BUDESONIDE 0.25 MG/2ML IN SUSP: 0.2500 mg | Freq: Two times a day (BID) | RESPIRATORY_TRACT | Status: DC

## 2022-04-29 MED ORDER — FUROSEMIDE 20 MG PO TABS
20.0000 mg | ORAL_TABLET | Freq: Every day | ORAL | Status: DC
  Administered 2022-04-29 – 2022-05-01 (×3): 20 mg via ORAL
  Filled 2022-04-29 (×4): qty 1

## 2022-04-29 MED ORDER — BUDESONIDE 0.5 MG/2ML IN SUSP
0.5000 mg | Freq: Two times a day (BID) | RESPIRATORY_TRACT | Status: DC
  Administered 2022-04-29 – 2022-05-02 (×6): 0.5 mg via RESPIRATORY_TRACT
  Filled 2022-04-29 (×7): qty 2

## 2022-04-29 MED ORDER — APIXABAN 5 MG PO TABS
5.0000 mg | ORAL_TABLET | Freq: Two times a day (BID) | ORAL | Status: DC
  Administered 2022-04-29 – 2022-05-02 (×6): 5 mg via ORAL
  Filled 2022-04-29 (×6): qty 1

## 2022-04-29 MED ORDER — GUAIFENESIN ER 600 MG PO TB12
1200.0000 mg | ORAL_TABLET | Freq: Two times a day (BID) | ORAL | Status: DC
  Administered 2022-04-29 – 2022-05-02 (×7): 1200 mg via ORAL
  Filled 2022-04-29 (×7): qty 2

## 2022-04-29 MED ORDER — ARFORMOTEROL TARTRATE 15 MCG/2ML IN NEBU
15.0000 ug | INHALATION_SOLUTION | Freq: Two times a day (BID) | RESPIRATORY_TRACT | Status: DC
  Administered 2022-04-29 – 2022-05-02 (×6): 15 ug via RESPIRATORY_TRACT
  Filled 2022-04-29 (×7): qty 2

## 2022-04-29 MED ORDER — REVEFENACIN 175 MCG/3ML IN SOLN
175.0000 ug | Freq: Every day | RESPIRATORY_TRACT | Status: DC
  Administered 2022-04-30 – 2022-05-02 (×3): 175 ug via RESPIRATORY_TRACT
  Filled 2022-04-29 (×4): qty 3

## 2022-04-29 MED ORDER — FLUOXETINE HCL 20 MG PO CAPS
20.0000 mg | ORAL_CAPSULE | Freq: Every day | ORAL | Status: DC
  Administered 2022-04-29 – 2022-05-02 (×4): 20 mg via ORAL
  Filled 2022-04-29 (×4): qty 1

## 2022-04-29 NOTE — Plan of Care (Signed)
  Problem: Clinical Measurements: Goal: Cardiovascular complication will be avoided Outcome: Progressing   Problem: Nutrition: Goal: Adequate nutrition will be maintained Outcome: Progressing   Problem: Elimination: Goal: Will not experience complications related to urinary retention Outcome: Progressing   Problem: Clinical Measurements: Goal: Respiratory complications will improve Outcome: Not Progressing   Problem: Pain Managment: Goal: General experience of comfort will improve Outcome: Not Progressing

## 2022-04-29 NOTE — Evaluation (Signed)
Physical Therapy Evaluation Patient Details Name: April [name] MRN: [id number] DOB: Sep 08, 1970 Today's Date: 04/29/2022  History of Present Illness  April [name] is a 52 y.o. female with medical history significant of  severe asthma/COPD, HTN, chronic diastolic CHF, PAF, anxiety/depression, morbid obesity, OSA, chronic pain, and RA presents with complaints of worsening shortness of breath over the last week, admitted for COPD exacerbation.  Clinical Impression  Patient from home where she lives alone and manages usually independently with ADL/IADL's.  She has DME at home if needed, but today ambulating without device with some likely pre-existing gait abnormalities due to RA and chronic pain and obesity.  Currently supervision for safety with lines and increased SOB and wheezing with hallway ambulation though SpO2 WNL on RA.  She will benefit from skilled PT in the acute setting to ensure safe mobility and able to negotiate steps for home entry as has had falls at home in the past.  Initiated education on lifealert system if needed.  Admits to multiple admissions (3 in one month) as she is scared since had issues in the past requiring intubation and tracheostomy with prolonged rehab.       Recommendations for follow up therapy are one component of a multi-disciplinary discharge planning process, led by the attending physician.  Recommendations may be updated based on patient status, additional functional criteria and insurance authorization.  Follow Up Recommendations No PT follow up      Assistance Recommended at Discharge PRN  Patient can return home with the following  Help with stairs or ramp for entrance;Assistance with cooking/housework    Equipment Recommendations None recommended by PT  Recommendations for Other Services       Functional Status Assessment Patient has had a recent decline in their functional status and demonstrates the ability to make significant improvements in  function in a reasonable and predictable amount of time.     Precautions / Restrictions Precautions Precautions: Fall Precaution Comments: reports falling when postassium was low      Mobility  Bed Mobility Overal bed mobility: Modified Independent                  Transfers Overall transfer level: Needs assistance   Transfers: Sit to/from Stand Sit to Stand: Supervision           General transfer comment: for safety with lines (O2, telemetry and SpO2)    Ambulation/Gait Ambulation/Gait assistance: Supervision Gait Distance (Feet): 90 Feet Assistive device: None Gait Pattern/deviations: Step-through pattern, Decreased stride length, Wide base of support       General Gait Details: increased lateral sway with ambulation  Stairs            Wheelchair Mobility    Modified Rankin (Stroke Patients Only)       Balance Overall balance assessment: Needs assistance   Sitting balance-Leahy Scale: Good       Standing balance-Leahy Scale: Good Standing balance comment: no UE support for balance, has increased lateral sway with ambulation                             Pertinent Vitals/Pain Pain Assessment Pain Assessment: Faces Faces Pain Scale: Hurts even more Pain Location: chronic pain in knees/hips Pain Descriptors / Indicators: Discomfort Pain Intervention(s): Monitored during session, Repositioned    Home Living Family/patient expects to be discharged to:: Private residence Living Arrangements: Alone Available Help at Discharge: Friend(s);Available PRN/intermittently Type of Home: Apartment  Home Access: Stairs to enter Entrance Stairs-Rails: Right Entrance Stairs-Number of Steps: 2-3   Home Layout: One level Home Equipment: Conservation officer, nature (2 wheels);Wheelchair - manual;Cane - single point      Prior Function Prior Level of Function : Independent/Modified Independent;Driving             Mobility Comments: one fall in  past 6 months, had low potassium, friend helped her up ADLs Comments: independent     Hand Dominance   Dominant Hand: Right    Extremity/Trunk Assessment   Upper Extremity Assessment Upper Extremity Assessment: Overall WFL for tasks assessed    Lower Extremity Assessment Lower Extremity Assessment: RLE deficits/detail;LLE deficits/detail RLE Deficits / Details: AROM grossly WFL, reports knee pain with full extension, strength grossly 4/5; reports chronic pain with h/o RA and noted crepitus in knees. RLE Sensation: decreased light touch (reports numbness due to Lyrica) LLE Deficits / Details: AROM grossly WFL, reports knee pain with full extension, strength grossly 4/5; reports chronic pain with h/o RA and noted crepitus in knees. LLE Sensation: decreased light touch (reports numbness due to Lyrica)       Communication   Communication: No difficulties  Cognition Arousal/Alertness: Awake/alert Behavior During Therapy: WFL for tasks assessed/performed Overall Cognitive Status: Within Functional Limits for tasks assessed                                          General Comments General comments (skin integrity, edema, etc.): on RA for ambulation with SpO2 95% however, when seated down to 72% but noted pt with pressure on hand with O2 probe so when hand placed on her knee SpO2 back to 100%  SOB and audible wheeze with ambulation RR up to 30's.    Exercises     Assessment/Plan    PT Assessment Patient needs continued PT services  PT Problem List Decreased mobility;Decreased activity tolerance;Cardiopulmonary status limiting activity       PT Treatment Interventions Therapeutic activities;Gait training;Stair training;Patient/family education;Balance training;Functional mobility training    PT Goals (Current goals can be found in the Care Plan section)  Acute Rehab PT Goals Patient Stated Goal: to return to independent PT Goal Formulation: With patient Time  For Goal Achievement: 05/13/22 Potential to Achieve Goals: Good    Frequency Min 3X/week     Co-evaluation               AM-PAC PT "6 Clicks" Mobility  Outcome Measure Help needed turning from your back to your side while in a flat bed without using bedrails?: None Help needed moving from lying on your back to sitting on the side of a flat bed without using bedrails?: None Help needed moving to and from a bed to a chair (including a wheelchair)?: A Little Help needed standing up from a chair using your arms (e.g., wheelchair or bedside chair)?: A Little Help needed to walk in hospital room?: A Little Help needed climbing 3-5 steps with a railing? : Total 6 Click Score: 18    End of Session Equipment Utilized During Treatment: Gait belt Activity Tolerance: Patient limited by fatigue Patient left: in bed;with call bell/phone within reach   PT Visit Diagnosis: Difficulty in walking, not elsewhere classified (R26.2)    Time: 0623-7628 PT Time Calculation (min) (ACUTE ONLY): 28 min   Charges:   PT Evaluation $PT Eval Moderate Complexity: 1 Mod PT Treatments $  Gait Training: 8-22 mins        Magda Kiel, PT Acute Rehabilitation Services Office:[phone] 04/29/2022   Reginia Naas 04/29/2022, 5:30 PM

## 2022-04-29 NOTE — Consult Note (Signed)
NAME:  April [name], MRN:  [id number], DOB:  [birth year], LOS: 1 ADMISSION DATE:  04/28/2022, CONSULTATION DATE: 04/29/2022 REFERRING MD:  Dr. Alfredia Ferguson, CHIEF COMPLAINT:  Asthma exacerbation    History of Present Illness:  This is a 52 year old female past medical history of severe asthma, COPD, hypertension, chronic diastolic heart failure, PAF, anxiety.,  Morbid obesity OSA chronic pain.  Patient uses 4 to 5 L home oxygen therapy.  She has had several exacerbations in the past several months.  She has been on other biologic injections in the past.  Currently seen in our office by Dr. Annamaria Boots.  She has had more frequent exacerbations after stopping tezepelumab.  Currently has an urgent appeal to her insurance company for prior authorization is to restart tezepelumab.  States that she has not been smoking.  Has not been around secondhand smoke.  Patient was admitted after being evaluated in the emergency department by hospitalist service.  Her influenza COVID-19 and RSV screening was negative.  Pertinent  Medical History   Past Medical History:  Diagnosis Date   Acanthosis nigricans    Anxiety    Arthritis    "knees" (04/28/2017)   Asthma    Followed by Dr. Annamaria Boots (pulmonology); receives every other week omalizumab injections; has frequent exacerbations   Back pain    Chronic diastolic CHF (congestive heart failure) ([place]) 01/17/2017   COPD (chronic obstructive pulmonary disease) ([place])    PFTs in 2002, FEV1/FVC 65, no post bronchodilater test done   Depression    GERD (gastroesophageal reflux disease)    Headache(784.0)    "q couple days" (02/63/7858)   Helicobacter pylori (H. pylori) infection    Hypertension, essential    Insomnia    Joint pain    Lower extremity edema    Menorrhagia    Morbid obesity ([place])    OSA on CPAP    Sleep study 2008 - mild OSA, not enough events to titrate CPAP; wears CPAP now/pt on 04/28/2017   Pneumonia X 1   Prediabetes    Rheumatoid arthritis (HCC)     Seasonal allergies    Shortness of breath    Tobacco user    Vitamin D deficiency      Significant Hospital Events: Including procedures, antibiotic start and stop dates in addition to other pertinent events     Interim History / Subjective:  Per HPI above.  Increase shortness of breath chest tightness.  Objective   Blood pressure (!) 144/85, pulse 72, temperature 98 F (36.7 C), temperature source Oral, resp. rate 17, last menstrual period 03/14/2022, SpO2 100 %.       No intake or output data in the 24 hours ending 04/29/22 1254 There were no vitals filed for this visit.  Examination: General: Obese female middle-aged, sitting in bed no distress, tachypneic HENT: Large neck, NCAT tracking appropriately Lungs: Bilateral expiratory wheezing Cardiovascular: Regular rate rhythm, S1-S2 Abdomen: Obese soft nontender nondistended Extremities: No significant edema Neuro: Alert oriented follow commands GU: Deferred  Resolved Hospital Problem list     Assessment & Plan:   Acute on chronic hypoxemic respiratory failure related to asthma/COPD exacerbation Plan: Triple therapy nebs, Pulmicort, Brovana, Yupelri IV steroids 40 mg twice daily Hopefully the office will be able to get her tezepelumab approved. Discharge at least on 20 mg prednisone daily until seen in the pulmonary office. Will need outpatient follow up in clinic upon discharge  Wean FiO2 to maintain sats greater than 88% Early mobilization, up in chair is  much as tolerated  Anemia Hypertension Paroxysmal atrial fibrillation on anticoagulation Chronic diastolic heart failure Hypothyroidism Anxiety depression Morbid obesity - Per primary   Labs   CBC: Recent Labs  Lab 04/28/22 0810 04/28/22 1044 04/29/22 0603  WBC 14.8*  --  14.2*  NEUTROABS 6.5  --   --   HGB 10.8* 11.9* 10.2*  HCT 37.1 35.0* 34.2*  MCV 88.5  --  85.7  PLT 350  --  793    Basic Metabolic Panel: Recent Labs  Lab 04/28/22 0810  04/28/22 1044 04/29/22 0603  NA 136 139 137  K 3.7 3.6 3.7  CL 110  --  110  CO2 22  --  17*  GLUCOSE 113*  --  108*  BUN 6  --  14  CREATININE 0.73  --  0.89  CALCIUM 8.7*  --  8.4*   GFR: CrCl cannot be calculated (Unknown ideal weight.). Recent Labs  Lab 04/28/22 0810 04/28/22 1029 04/29/22 0603  PROCALCITON  --  <0.10  --   WBC 14.8*  --  14.2*    Liver Function Tests: No results for input(s): "AST", "ALT", "ALKPHOS", "BILITOT", "PROT", "ALBUMIN" in the last 168 hours. No results for input(s): "LIPASE", "AMYLASE" in the last 168 hours. No results for input(s): "AMMONIA" in the last 168 hours.  ABG    Component Value Date/Time   PHART 7.28 (L) 01/08/2022 0000   PCO2ART 49 (H) 01/08/2022 0000   PO2ART 33 (LL) 01/08/2022 0000   HCO3 18.2 (L) 04/28/2022 1044   TCO2 19 (L) 04/28/2022 1044   ACIDBASEDEF 8.0 (H) 04/28/2022 1044   O2SAT 64 04/28/2022 1044     Coagulation Profile: No results for input(s): "INR", "PROTIME" in the last 168 hours.  Cardiac Enzymes: No results for input(s): "CKTOTAL", "CKMB", "CKMBINDEX", "TROPONINI" in the last 168 hours.  HbA1C: HbA1c, POC (prediabetic range)  Date/Time Value Ref Range Status  03/24/2019 04:08 PM 5.6 (A) 5.7 - 6.4 % Final   HbA1c, POC (controlled diabetic range)  Date/Time Value Ref Range Status  03/11/2022 02:49 PM 5.5 0.0 - 7.0 % Final  11/28/2021 01:49 PM 6.3 0.0 - 7.0 % Final    CBG: No results for input(s): "GLUCAP" in the last 168 hours.  Review of Systems:   Review of Systems  Constitutional:  Positive for malaise/fatigue. Negative for chills, fever and weight loss.  HENT:  Negative for hearing loss, sore throat and tinnitus.   Eyes:  Negative for blurred vision and double vision.  Respiratory:  Positive for shortness of breath and wheezing. Negative for cough, hemoptysis, sputum production and stridor.   Cardiovascular:  Negative for chest pain, palpitations, orthopnea, leg swelling and PND.   Gastrointestinal:  Negative for abdominal pain, constipation, diarrhea, heartburn, nausea and vomiting.  Genitourinary:  Negative for dysuria, hematuria and urgency.  Musculoskeletal:  Negative for joint pain and myalgias.  Skin:  Negative for itching and rash.  Neurological:  Negative for dizziness, tingling, weakness and headaches.  Endo/Heme/Allergies:  Negative for environmental allergies. Does not bruise/bleed easily.  Psychiatric/Behavioral:  Negative for depression. The patient is not nervous/anxious and does not have insomnia.   All other systems reviewed and are negative.    Past Medical History:  She,  has a past medical history of Acanthosis nigricans, Anxiety, Arthritis, Asthma, Back pain, Chronic diastolic CHF (congestive heart failure) ([place]) (01/17/2017), COPD (chronic obstructive pulmonary disease) ([place]), Depression, GERD (gastroesophageal reflux disease), [id number](300.7), Helicobacter pylori (H. pylori) infection, Hypertension, essential, Insomnia, Joint pain, Lower  extremity edema, Menorrhagia, Morbid obesity ([place]), OSA on CPAP, Pneumonia (X 1), Prediabetes, Rheumatoid arthritis ([place]), Seasonal allergies, Shortness of breath, Tobacco user, and Vitamin D deficiency.   Surgical History:   Past Surgical History:  Procedure Laterality Date   CARDIOVERSION N/A 05/30/2017   Procedure: CARDIOVERSION;  Surgeon: Sanda Klein, MD;  Location: [facility];  Service: Cardiovascular;  Laterality: N/A;   REDUCTION MAMMAPLASTY Bilateral 09/2011   TUBAL LIGATION  1996   bilateral     Social History:   reports that she quit smoking about 7 years ago. Her smoking use included cigarettes. She has a 13.00 pack-year smoking history. She has never used smokeless tobacco. She reports that she does not drink alcohol and does not use drugs.   Family History:  Her family history includes Asthma in her daughter and maternal grandmother; Cancer in her paternal aunt; Hypertension in her mother.    Allergies Allergies  Allergen Reactions   Contrast Media [Iodinated Contrast Media] Itching    CT contrast. Unknown per PT.      Home Medications  Prior to Admission medications   Medication Sig Start Date End Date Taking? Authorizing Provider  albuterol (PROAIR HFA) 108 (90 Base) MCG/ACT inhaler Inhale 2 puffs into the lungs every 6 (six) hours as needed for wheezing or shortness of breath. Patient taking differently: Inhale 2 puffs into the lungs as needed for wheezing or shortness of breath. 11/13/21  Yes Young, Tarri Fuller D, MD  amLODipine (NORVASC) 10 MG tablet Take 1 tablet (10 mg total) by mouth daily. 03/11/22  Yes [name], MD  apixaban (ELIQUIS) 5 MG TABS tablet Take 1 tablet (5 mg total) by mouth 2 (two) times daily. 11/28/21  Yes [name], MD  baclofen (LIORESAL) 20 MG tablet Take 20-40 mg by mouth 3 (three) times daily as needed for muscle spasms. 04/25/22  Yes [provider]  carvedilol (COREG) 6.25 MG tablet TAKE 1 TABLET(6.25 MG) BY MOUTH TWICE DAILY WITH A MEAL Patient taking differently: Take 6.25 mg by mouth daily. 03/18/22  Yes [name], MD  clonazePAM (KLONOPIN) 0.5 MG tablet Take 1 tablet (0.5 mg total) by mouth daily as needed for anxiety. Patient taking differently: Take 0.5 mg by mouth 2 (two) times daily as needed for anxiety. 10/18/21  Yes Florencia Reasons, MD  cloNIDine (CATAPRES) 0.2 MG tablet Take 1 tablet (0.2 mg total) by mouth 2 (two) times daily. 03/11/22  Yes Newlin, Charlane Ferretti, MD  EPINEPHrine 0.3 mg/0.3 mL IJ SOAJ injection Inject 0.3 mg into the muscle as needed for anaphylaxis. 05/30/20  Yes Young, Tarri Fuller D, MD  famotidine (PEPCID) 40 MG tablet Take 40 mg by mouth daily. 01/25/22  Yes [provider]  FLUoxetine (PROZAC) 20 MG capsule Take 1 capsule (20 mg total) by mouth daily. Take 1 capsule daily. 04/09/22  Yes Doda, Edd Arbour, MD  fluticasone (FLONASE) 50 MCG/ACT nasal spray SHAKE LIQUID AND USE 1 SPRAY IN EACH NOSTRIL DAILY AS  NEEDED FOR ALLERGIES OR RHINITIS Patient taking differently: Place 1 spray into both nostrils as needed for allergies. 08/05/21  Yes [name], MD  Fluticasone-Umeclidin-Vilant 100-62.5-25 MCG/ACT AEPB Inhale 1 puff into the lungs daily. 10/26/21  Yes Charlynne Cousins, MD  furosemide (LASIX) 20 MG tablet TAKE 1 TABLET(20 MG) BY MOUTH DAILY Patient taking differently: Take 20 mg by mouth daily. 04/12/22  Yes Newlin, Enobong, MD  ipratropium-albuterol (DUONEB) 0.5-2.5 (3) MG/3ML SOLN 1 neb every 6 hours if needed for asthma Patient taking differently: Take 3 mLs by  nebulization every 4 (four) hours as needed (shortness of breath). 01/28/22  Yes Young, Tarri Fuller D, MD  levothyroxine (SYNTHROID) 50 MCG tablet Take 1 tablet (50 mcg total) by mouth daily. 11/28/21  Yes [name], MD  naloxone (NARCAN) nasal spray 4 mg/0.1 mL Place 1 spray into the nose once as needed (opioid overdose). 08/09/20  Yes [provider]  oxyCODONE-acetaminophen (PERCOCET) 10-325 MG tablet Take 1 tablet by mouth 5 (five) times daily as needed for pain. 03/26/22  Yes   phentermine 15 MG capsule Take 15 mg by mouth daily. 08/10/21  Yes [provider]  potassium chloride SA (KLOR-CON M) 20 MEQ tablet TAKE 1 TABLET(20 MEQ) BY MOUTH DAILY Patient taking differently: Take 20 mEq by mouth daily. 02/01/22  Yes [name], MD  pregabalin (LYRICA) 300 MG capsule Take 300 mg by mouth in the morning, at noon, and at bedtime. 01/08/21  Yes [provider]  Semaglutide, 1 MG/DOSE, (OZEMPIC, 1 MG/DOSE,) 4 MG/3ML SOPN Inject 1 mg into the skin once a week.   Yes [provider]  Tezepelumab-ekko (TEZSPIRE) 210 MG/1.91ML SOAJ Inject 210 mg into the skin every 28 (twenty-eight) days. 11/05/21  Yes Young, Tarri Fuller D, MD  topiramate (TOPAMAX) 200 MG tablet Take 200 mg by mouth at bedtime. 01/08/21  Yes [provider]  Vitamin D, Ergocalciferol, (DRISDOL) 1.25 MG (50000 UNIT) CAPS capsule Take  50,000 Units by mouth once a week. 11/30/21  Yes [provider]  zolpidem (AMBIEN) 10 MG tablet TAKE 1 TABLET BY MOUTH AT BEDTIME AS NEEDED FOR SLEEP Patient taking differently: Take 10 mg by mouth at bedtime. 01/28/22  Yes Young, Clinton D, MD  mometasone-formoterol (DULERA) 200-5 MCG/ACT AERO Inhale 2 puffs into the lungs 2 (two) times daily. Dx: Asthma 04/08/19 08/12/19  Deneise Lever, MD      Garner Nash, DO [facility] 04/29/2022 2:35 PM

## 2022-04-29 NOTE — ED Notes (Signed)
ED TO INPATIENT HANDOFF REPORT  ED Nurse Name and Phone #: [phone]  S Name/Age/Gender April [name] 52 y.o. female Room/Bed: 014C/014C  Code Status   Code Status: Full Code  Home/SNF/Other Home Patient oriented to: self, place, time, and situation Is this baseline? Yes   Triage Complete: Triage complete  Chief Complaint Asthma with COPD with exacerbation ([place]) [J44.1, K27.062]  Triage Note Pt arrives via EMS from home with hx of COPD, reports SOB for the last week, worsened around 5am. Pt with wheezing in upper lung fields. 150/88, 92, rr 28, 96% on neb. '10mg'$  alb, 0.'5mg'$  atrovent, '125mg'$  solumedrol, 22g in RAC.    Allergies Allergies  Allergen Reactions   Contrast Media [Iodinated Contrast Media] Itching    CT contrast. Unknown per PT.     Level of Care/Admitting Diagnosis ED Disposition     ED Disposition  Admit   Condition  --   Comment  Hospital Area: [facility] [[id number]]  Level of Care: Telemetry Medical [104]  May admit patient to [facility] or [facility] if equivalent level of care is available:: No  Covid Evaluation: Asymptomatic - no recent exposure (last 10 days) testing not required  Diagnosis: Asthma with COPD with exacerbation [facility]) [[id number]]  Admitting Physician: [name] [[id number]]  Attending Physician: [name] [[id number]]  Certification:: I certify this patient will need inpatient services for at least 2 midnights  Estimated Length of Stay: 2          B Medical/Surgery History Past Medical History:  Diagnosis Date   Acanthosis nigricans    Anxiety    Arthritis    "knees" (04/28/2017)   Asthma    Followed by Dr. Annamaria Boots (pulmonology); receives every other week omalizumab injections; has frequent exacerbations   Back pain    Chronic diastolic CHF (congestive heart failure) ([place]) 01/17/2017   COPD (chronic obstructive pulmonary disease) ([place])    PFTs in 2002, FEV1/FVC 65, no post bronchodilater test done    Depression    GERD (gastroesophageal reflux disease)    Headache(784.0)    "q couple days" (26/94/8546)   Helicobacter pylori (H. pylori) infection    Hypertension, essential    Insomnia    Joint pain    Lower extremity edema    Menorrhagia    Morbid obesity ([place])    OSA on CPAP    Sleep study 2008 - mild OSA, not enough events to titrate CPAP; wears CPAP now/pt on 04/28/2017   Pneumonia X 1   Prediabetes    Rheumatoid arthritis (HCC)    Seasonal allergies    Shortness of breath    Tobacco user    Vitamin D deficiency    Past Surgical History:  Procedure Laterality Date   CARDIOVERSION N/A 05/30/2017   Procedure: CARDIOVERSION;  Surgeon: Sanda Klein, MD;  Location: [facility];  Service: Cardiovascular;  Laterality: N/A;   REDUCTION MAMMAPLASTY Bilateral 09/2011   TUBAL LIGATION  1996   bilateral     A IV Location/Drains/Wounds Patient Lines/Drains/Airways Status     Active Line/Drains/Airways     Name Placement date Placement time Site Days   Peripheral IV 04/28/22 20 G Left Antecubital 04/28/22  1706  Antecubital  1            Intake/Output Last 24 hours No intake or output data in the 24 hours ending 04/29/22 1329  Labs/Imaging Results for orders placed or performed during the hospital encounter of 04/28/22 (from the past 48  hour(s))  Basic metabolic panel     Status: Abnormal   Collection Time: 04/28/22  8:10 AM  Result Value Ref Range   Sodium 136 135 - 145 mmol/L   Potassium 3.7 3.5 - 5.1 mmol/L   Chloride 110 98 - 111 mmol/L   CO2 22 22 - 32 mmol/L   Glucose, Bld 113 (H) 70 - 99 mg/dL    Comment: Glucose reference range applies only to samples taken after fasting for at least 8 hours.   BUN 6 6 - 20 mg/dL   Creatinine, Ser 0.73 0.44 - 1.00 mg/dL   Calcium 8.7 (L) 8.9 - 10.3 mg/dL   GFR, Estimated >60 >60 mL/min    Comment: (NOTE) Calculated using the CKD-EPI Creatinine Equation (2021)    Anion gap 4 (L) 5 - 15    Comment: Performed at [facility] [address] [place], [place] [address]  CBC with Differential     Status: Abnormal   Collection Time: 04/28/22  8:10 AM  Result Value Ref Range   WBC 14.8 (H) 4.0 - 10.5 K/uL   RBC 4.19 3.87 - 5.11 MIL/uL   Hemoglobin 10.8 (L) 12.0 - 15.0 g/dL   HCT 37.1 36.0 - 46.0 %   MCV 88.5 80.0 - 100.0 fL   MCH 25.8 (L) 26.0 - 34.0 pg   MCHC 29.1 (L) 30.0 - 36.0 g/dL   RDW 19.2 (H) 11.5 - 15.5 %   Platelets 350 150 - 400 K/uL   nRBC 0.0 0.0 - 0.2 %   Neutrophils Relative % 44 %   Neutro Abs 6.5 1.7 - 7.7 K/uL   Lymphocytes Relative 43 %   Lymphs Abs 6.6 (H) 0.7 - 4.0 K/uL   Monocytes Relative 9 %   Monocytes Absolute 1.3 (H) 0.1 - 1.0 K/uL   Eosinophils Relative 3 %   Eosinophils Absolute 0.4 0.0 - 0.5 K/uL   Basophils Relative 0 %   Basophils Absolute 0.0 0.0 - 0.1 K/uL   Immature Granulocytes 1 %   Abs Immature Granulocytes 0.07 0.00 - 0.07 K/uL    Comment: Performed at [facility], [place] [address] [place], [place] [address]  Brain natriuretic peptide     Status: None   Collection Time: 04/28/22  8:10 AM  Result Value Ref Range   B Natriuretic Peptide 58.9 0.0 - 100.0 pg/mL    Comment: Performed at [facility] [address] [place], [place] [address]  Resp panel by RT-PCR (RSV, Flu A&B, Covid) Anterior Nasal Swab     Status: None   Collection Time: 04/28/22  8:20 AM   Specimen: Anterior Nasal Swab  Result Value Ref Range   SARS Coronavirus 2 by RT PCR NEGATIVE NEGATIVE    Comment: (NOTE) SARS-CoV-2 target nucleic acids are NOT DETECTED.  The SARS-CoV-2 RNA is generally detectable in upper respiratory specimens during the acute phase of infection. The lowest concentration of SARS-CoV-2 viral copies this assay can detect is 138 copies/mL. A negative result does not preclude SARS-Cov-2 infection and should not be used as the sole basis for treatment or other patient management decisions. A negative result may occur with  improper specimen  collection/handling, submission of specimen other than nasopharyngeal swab, presence of viral mutation(s) within the areas targeted by this assay, and inadequate number of viral copies(<138 copies/mL). A negative result must be combined with clinical observations, patient history, and epidemiological information. The expected result is Negative.  Fact Sheet for  Patients:  EntrepreneurPulse.com.au  Fact Sheet for Healthcare Providers:  IncredibleEmployment.be  This test is no t yet approved or cleared by the [place] FDA and  has been authorized for detection and/or diagnosis of SARS-CoV-2 by FDA under an Emergency Use Authorization (EUA). This EUA will remain  in effect (meaning this test can be used) for the duration of the COVID-19 declaration under Section 564(b)(1) of the Act, 21 U.S.C.section 360bbb-3(b)(1), unless the authorization is terminated  or revoked sooner.       Influenza A by PCR NEGATIVE NEGATIVE   Influenza B by PCR NEGATIVE NEGATIVE    Comment: (NOTE) The Xpert Xpress SARS-CoV-2/FLU/RSV plus assay is intended as an aid in the diagnosis of influenza from Nasopharyngeal swab specimens and should not be used as a sole basis for treatment. Nasal washings and aspirates are unacceptable for Xpert Xpress SARS-CoV-2/FLU/RSV testing.  Fact Sheet for Patients: EntrepreneurPulse.com.au  Fact Sheet for Healthcare Providers: IncredibleEmployment.be  This test is not yet approved or cleared by the [place] FDA and has been authorized for detection and/or diagnosis of SARS-CoV-2 by FDA under an Emergency Use Authorization (EUA). This EUA will remain in effect (meaning this test can be used) for the duration of the COVID-19 declaration under Section 564(b)(1) of the Act, 21 U.S.C. section 360bbb-3(b)(1), unless the authorization is terminated or revoked.     Resp Syncytial Virus by PCR  NEGATIVE NEGATIVE    Comment: (NOTE) Fact Sheet for Patients: EntrepreneurPulse.com.au  Fact Sheet for Healthcare Providers: IncredibleEmployment.be  This test is not yet approved or cleared by the [place] FDA and has been authorized for detection and/or diagnosis of SARS-CoV-2 by FDA under an Emergency Use Authorization (EUA). This EUA will remain in effect (meaning this test can be used) for the duration of the COVID-19 declaration under Section 564(b)(1) of the Act, 21 U.S.C. section 360bbb-3(b)(1), unless the authorization is terminated or revoked.  Performed at [facility], [place] [address] [place], [place] [address]   Respiratory (~20 pathogens) panel by PCR     Status: None   Collection Time: 04/28/22  8:20 AM   Specimen: Nasopharyngeal Swab; Respiratory  Result Value Ref Range   Adenovirus NOT DETECTED NOT DETECTED   Coronavirus 229E NOT DETECTED NOT DETECTED    Comment: (NOTE) The Coronavirus on the Respiratory Panel, DOES NOT test for the novel  Coronavirus (2019 nCoV)    Coronavirus HKU1 NOT DETECTED NOT DETECTED   Coronavirus NL63 NOT DETECTED NOT DETECTED   Coronavirus OC43 NOT DETECTED NOT DETECTED   Metapneumovirus NOT DETECTED NOT DETECTED   Rhinovirus / Enterovirus NOT DETECTED NOT DETECTED   Influenza A NOT DETECTED NOT DETECTED   Influenza B NOT DETECTED NOT DETECTED   Parainfluenza Virus 1 NOT DETECTED NOT DETECTED   Parainfluenza Virus 2 NOT DETECTED NOT DETECTED   Parainfluenza Virus 3 NOT DETECTED NOT DETECTED   Parainfluenza Virus 4 NOT DETECTED NOT DETECTED   Respiratory Syncytial Virus NOT DETECTED NOT DETECTED   Bordetella pertussis NOT DETECTED NOT DETECTED   Bordetella Parapertussis NOT DETECTED NOT DETECTED   Chlamydophila pneumoniae NOT DETECTED NOT DETECTED   Mycoplasma pneumoniae NOT DETECTED NOT DETECTED    Comment: Performed at [facility], [place] [address] [place], [place] [address]   Troponin I (High Sensitivity)     Status: None   Collection Time: 04/28/22  8:42 AM  Result Value Ref Range   Troponin I (High Sensitivity) 4 <18 ng/L    Comment: (  NOTE) Elevated high sensitivity troponin I (hsTnI) values and significant  changes across serial measurements may suggest ACS but many other  chronic and acute conditions are known to elevate hsTnI results.  Refer to the "Links" section for chest pain algorithms and additional  guidance. Performed at [facility], [place] [address] [place], [place] [address]   Troponin I (High Sensitivity)     Status: None   Collection Time: 04/28/22 10:07 AM  Result Value Ref Range   Troponin I (High Sensitivity) 4 <18 ng/L    Comment: (NOTE) Elevated high sensitivity troponin I (hsTnI) values and significant  changes across serial measurements may suggest ACS but many other  chronic and acute conditions are known to elevate hsTnI results.  Refer to the "Links" section for chest pain algorithms and additional  guidance. Performed at [facility], [place] [address], [place], [place] [address]   Procalcitonin - Baseline     Status: None   Collection Time: 04/28/22 10:29 AM  Result Value Ref Range   Procalcitonin <0.10 ng/mL    Comment:        Interpretation: PCT (Procalcitonin) <= 0.5 ng/mL: Systemic infection (sepsis) is not likely. Local bacterial infection is possible. (NOTE)       Sepsis PCT Algorithm           Lower Respiratory Tract                                      Infection PCT Algorithm    ----------------------------     ----------------------------         PCT < 0.25 ng/mL                PCT < 0.10 ng/mL          Strongly encourage             Strongly discourage   discontinuation of antibiotics    initiation of antibiotics    ----------------------------     -----------------------------       PCT 0.25 - 0.50 ng/mL            PCT 0.10 - 0.25 ng/mL               OR       >80% decrease in PCT             Discourage initiation of                                            antibiotics      Encourage discontinuation           of antibiotics    ----------------------------     -----------------------------         PCT >= 0.50 ng/mL              PCT 0.26 - 0.50 ng/mL               AND        <80% decrease in PCT             Encourage initiation of  antibiotics       Encourage continuation           of antibiotics    ----------------------------     -----------------------------        PCT >= 0.50 ng/mL                  PCT > 0.50 ng/mL               AND         increase in PCT                  Strongly encourage                                      initiation of antibiotics    Strongly encourage escalation           of antibiotics                                     -----------------------------                                           PCT <= 0.25 ng/mL                                                 OR                                        > 80% decrease in PCT                                      Discontinue / Do not initiate                                             antibiotics  Performed at [facility], [address], [place], [place] [address]   I-Stat venous blood gas, ED     Status: Abnormal   Collection Time: 04/28/22 10:44 AM  Result Value Ref Range   pH, Ven 7.265 7.25 - 7.43   pCO2, Ven 40.1 (L) 44 - 60 mmHg   pO2, Ven 38 32 - 45 mmHg   Bicarbonate 18.2 (L) 20.0 - 28.0 mmol/L   TCO2 19 (L) 22 - 32 mmol/L   O2 Saturation 64 %   Acid-base deficit 8.0 (H) 0.0 - 2.0 mmol/L   Sodium 139 135 - 145 mmol/L   Potassium 3.6 3.5 - 5.1 mmol/L   Calcium, Ion 1.21 1.15 - 1.40 mmol/L   HCT 35.0 (L) 36.0 - 46.0 %   Hemoglobin 11.9 (L) 12.0 - 15.0 g/dL   Sample type VENOUS    Comment NOTIFIED PHYSICIAN   CBC     Status: Abnormal   Collection Time: 04/29/22  6:03 AM  Result Value Ref Range   WBC 14.2 (H) 4.0 - 10.5 K/uL    RBC 3.99 3.87 - 5.11 MIL/uL   Hemoglobin 10.2 (L) 12.0 - 15.0 g/dL   HCT 34.2 (L) 36.0 - 46.0 %   MCV 85.7 80.0 - 100.0 fL   MCH 25.6 (L) 26.0 - 34.0 pg   MCHC 29.8 (L) 30.0 - 36.0 g/dL   RDW 19.1 (H) 11.5 - 15.5 %   Platelets 352 150 - 400 K/uL   nRBC 0.0 0.0 - 0.2 %    Comment: Performed at [facility] [address], [place], [place] [address]  Basic metabolic panel     Status: Abnormal   Collection Time: 04/29/22  6:03 AM  Result Value Ref Range   Sodium 137 135 - 145 mmol/L   Potassium 3.7 3.5 - 5.1 mmol/L   Chloride 110 98 - 111 mmol/L   CO2 17 (L) 22 - 32 mmol/L   Glucose, Bld 108 (H) 70 - 99 mg/dL    Comment: Glucose reference range applies only to samples taken after fasting for at least 8 hours.   BUN 14 6 - 20 mg/dL   Creatinine, Ser 0.89 0.44 - 1.00 mg/dL   Calcium 8.4 (L) 8.9 - 10.3 mg/dL   GFR, Estimated >60 >60 mL/min    Comment: (NOTE) Calculated using the CKD-EPI Creatinine Equation (2021)    Anion gap 10 5 - 15    Comment: Performed at [facility] [address], [place], [place] [address]   *Note: Due to a large number of results and/or encounters for the requested time period, some results have not been displayed. A complete set of results can be found in Results Review.   DG Chest Port 1 View  Result Date: 04/28/2022 CLINICAL DATA:  52 year old female with history of shortness of breath. EXAM: PORTABLE CHEST 1 VIEW COMPARISON:  Chest x-ray 04/09/2022. FINDINGS: Lung volumes are low. No consolidative airspace disease. No pleural effusions. No pneumothorax. No pulmonary nodule or mass noted. Pulmonary vasculature and the cardiomediastinal silhouette are within normal limits. IMPRESSION: 1. Low lung volumes without radiographic evidence of acute cardiopulmonary disease. Electronically Signed   By: Vinnie Langton M.D.   On: 04/28/2022 08:40    Pending Labs Unresulted Labs (From admission, onward)    None       Vitals/Pain Today's Vitals    04/29/22 0810 04/29/22 0810 04/29/22 1152 04/29/22 1238  BP:    (!) 144/85  Pulse:    72  Resp:    17  Temp: 98.1 F (36.7 C)   98 F (36.7 C)  TempSrc: Oral   Oral  SpO2:   98% 100%  PainSc:  7       Isolation Precautions Droplet precaution  Medications Medications  acetaminophen (TYLENOL) tablet 650 mg (has no administration in time range)    Or  acetaminophen (TYLENOL) suppository 650 mg (has no administration in time range)  ondansetron (ZOFRAN) tablet 4 mg (has no administration in time range)    Or  ondansetron (ZOFRAN) injection 4 mg (has no administration in time range)  ipratropium-albuterol (DUONEB) 0.5-2.5 (3) MG/3ML nebulizer solution 3 mL (3 mLs Nebulization Given 04/29/22 1236)  albuterol (PROVENTIL) (2.5 MG/3ML) 0.083% nebulizer solution 2.5 mg (2.5 mg Nebulization Given 04/29/22 0557)  amLODipine (NORVASC) tablet 10 mg (10 mg Oral Given 04/29/22 0921)  carvedilol (COREG) tablet 6.25 mg (6.25 mg Oral Given 04/29/22 0805)  cloNIDine (CATAPRES) tablet 0.2 mg (0.2 mg  Oral Given 04/29/22 0921)  zolpidem (AMBIEN) tablet 10 mg (10 mg Oral Given 04/28/22 2209)  levothyroxine (SYNTHROID) tablet 50 mcg (50 mcg Oral Given 04/29/22 0551)  famotidine (PEPCID) tablet 40 mg (40 mg Oral Given 04/29/22 0921)  clonazePAM (KLONOPIN) tablet 0.5 mg (has no administration in time range)  pregabalin (LYRICA) capsule 300 mg (300 mg Oral Given 04/29/22 0921)  topiramate (TOPAMAX) tablet 200 mg (200 mg Oral Given 04/28/22 2209)  baclofen (LIORESAL) tablet 20-40 mg (40 mg Oral Given 04/29/22 0921)  fluticasone furoate-vilanterol (BREO ELLIPTA) 100-25 MCG/ACT 1 puff (1 puff Inhalation Not Given 04/29/22 1008)  oxyCODONE (Oxy IR/ROXICODONE) immediate release tablet 10 mg (10 mg Oral Given 04/29/22 0557)    And  acetaminophen (TYLENOL) tablet 325 mg (has no administration in time range)  cefTRIAXone (ROCEPHIN) 2 g in sodium chloride 0.9 % 100 mL IVPB (0 g Intravenous Stopped 04/28/22 1946)  guaiFENesin  (MUCINEX) 12 hr tablet 1,200 mg (1,200 mg Oral Given 04/29/22 1034)  methylPREDNISolone sodium succinate (SOLU-MEDROL) 125 mg/2 mL injection 60 mg (60 mg Intravenous Given 04/29/22 1035)  ipratropium-albuterol (DUONEB) 0.5-2.5 (3) MG/3ML nebulizer solution 3 mL (3 mLs Nebulization Given 04/28/22 0843)  acetaminophen (TYLENOL) tablet 1,000 mg (1,000 mg Oral Given 04/28/22 1219)    Mobility walks Low fall risk   Focused Assessments Pulmonary Assessment Handoff:  Lung sounds: Bilateral Breath Sounds: Diminished, Expiratory wheezes, Inspiratory wheezes L Breath Sounds: Diminished, Inspiratory wheezes R Breath Sounds: Diminished, Inspiratory wheezes O2 Device: Nasal Cannula O2 Flow Rate (L/min): 3 L/min    R Recommendations: See Admitting Provider Note  Report given to:   Additional Notes:

## 2022-04-29 NOTE — Progress Notes (Signed)
PROGRESS NOTE    April [name]  JHE:[id number] DOB: 1971/02/11 DOA: 04/28/2022 PCP: [name], MD   Brief Narrative:  HPI per Dr. Fuller Plan on 04/28/22 April [name] is a 52 y.o. female with medical history significant of  severe asthma/COPD, HTN, chronic diastolic CHF, PAF, anxiety/depression, morbid obesity, OSA, chronic pain, and RA presents with complaints of worsening shortness of breath over the last week.  She reports having a productive cough with congestion, rhinorrhea, and wheezing.  She had tried home medications breathing treatments and inhalers without improvement in symptoms.  Patient notes that recently she has been using 4 to 5 L of oxygen at night and 3 L during the day while she has been sick.  Denies having any significant fever or issues with bleeding, but has had subjective hot and cold flashes.  This the patient's third admission into the hospital with similar symptoms this month.  She questions if symptoms or onset by possible mold in the apartment where she is staying.  She had notified the landlord and states that she thinks they just painted over the area in the bathroom.  Furthermore she follows with Dr. Annamaria Boots of pulmonology in the outpatient setting and she thinks that ever since she stopped doing the [place] treatments in clinic she has been having more frequent exacerbations.  She states that she has not been around secondhand smoke or any recent sick contacts.  Patient reports that she cannot use the CPAP machine.  April [name] reports having pain all over for which she is on chronic pain medication.   In route with EMS patient was noted to have wheezing in upper lung fields.  She had been given DuoNeb breathing treatment and Solu-Medrol 120 mg IV prior to arrival.   In the emergency department patient was noted to be afebrile with pulse 84-1 12, respirations 22-24, blood pressures 104/83 to 156/81, and O2 saturations currently maintained greater than 90% on 4 L of  nasal cannula oxygen.  Labs significant for WBC 14.8, hemoglobin 10.8, and BNP 58.9.  Influenza, COVID-19, and RSV screen were negative.   **Interim History  Given her frequent COPD exacerbations pulmonary was consulted.  Pulmonary has met and recommended triple therapy nebs with Pulmicort, Brovana and Yupelri as well as continue IV steroids and hoping that her tezepelumab gets approved.  Assessment and Plan: No notes have been filed under this hospital service. Service: Hospitalist  Acute on Chronic Respiratory failure with hypoxia secondary to asthma with COPD exacerbation -Acute on chronic.  Patient presents with complaints of worsening shortness of breath with cough and wheezing. -Noted to have significant wheezing and upper lung fields on physical exam.   -O2 saturations currently maintained on 4 L nasal cannula oxygen.   -SpO2: 98 % O2 Flow Rate (L/min): 3 L/min -Venous pH did not show any significant signs of hypercapnia.  Patient had received 125 mg of Solu-Medrol IV route with EMS and DuoNeb breathing treatments.  This is patient's third admission for similar symptoms this month.  Unclear if provoked by reports of possible mold in her home. -Admit to a medical telemetry bed -COPD order set utilized and now medications have been adjusted  -Continuous pulse oximetry with nasal cannula oxygen maintain O2 saturation greater than 92% -Check procalcitonin was less than 0.10 -She is on DuoNeb 4 times daily but pulmonary is now recommending changing and adding Yupelri 175 mg nebs daily as well as Brovana and budesonide -Continue with guaifenesin 1200 was p.o. twice daily,  flutter valve as well as incentive spirometry -Continue pharmacy substitution of Breo but have now continued -Prednisone 40 mg daily now changed to IV Solu-Medrol 60 mg every 12h and pulmonary recommending at least discharging on 10 mg of prednisone daily and discharge -Rocephin 2 g IV will be continued -Continue with  pulmonary toilet -Recommend outpatient follow-up with Dr. Annamaria Boots of pulmonology in the outpatient setting once stable for discharge but will consult inpatient pulmonology for further evaluation recommendations -She will need an amatory home O2 screen as well as a repeat chest x-ray in a.m.   Leukocytosis Chronic.  WBC elevated at 14.8, but appears to be chronically elevated over the last several years.  Question if secondary to chronic pain medication or frequent steroids. Recent Labs  Lab 04/04/22 0535 04/05/22 0409 04/06/22 0406 04/09/22 0724 04/28/22 0810 04/29/22 0603  WBC 19.3* 16.9* 16.8* 19.4* 14.8* 14.2*  -Continue to monitor   Essential hypertension -Blood pressures currently maintained. -Continue current home medication regimen with amlodipine 10 mg p.o. daily, clonidine 0.2 mg p.o. twice daily as well as carvedilol 6.25 mg p.o. twice daily -Continue monitor blood pressures per protocol -Last blood pressure reading was 116/81   Paroxysmal atrial fibrillation on chronic anticoagulation -Currently sinus rhythm. -Continue Carvedilol 6.25 mg po BID and Apixaban 5 mg po BID .   Chronic Diastolic Heart Failure -Patient appears to be compensated.  BNP 58.9. -Resume Home Diuretics -Strict I's and O's and Daily Weights -Continue to Monitor for S/Sx of Volume Overload   Hypothyroidism TSH was noted to be 2.686 on 10/23/2021. -Continue Levothyroxine   Anxiety and Depression -Continue Fluoxetine 20 mg po Daily and Clonazepam as needed   Obstructive of sleep apnea/Possibly obesity hypoventilation syndrome -Patient has not been using CPAP at night.  Metabolic Acidosis -Patient's CO2 is now 17, AG is 10, Chloride Level is 117 -Continue to Monitor and Trend and Repeat CMP in the AM   Normocytic Hypochromic Anemia -Hgb/Hct Trend: Recent Labs  Lab 04/05/22 0409 04/06/22 0406 04/09/22 0724 04/09/22 0809 04/28/22 0810 04/28/22 1044 04/29/22 0603  HGB 11.5* 11.5* 12.4  13.6 10.8* 11.9* 10.2*  HCT 38.4 37.1 39.5 40.0 37.1 35.0* 34.2*  MCV 84.4 83.2 82.0  --  88.5  --  85.7  -Check Anemia Panel in the AM  -Continue to Monitor for S/Sx of Bleeding; NO overt Bleeding Noted -Repeat CBC in the AM   Super Morbid Obesity -Complicates overall prognosis and care -Estimated body mass index is 51.6 kg/m as calculated from the following:   Height as of 04/09/22: _0  (1.676 m).   Weight as of 04/09/22: 145 kg.  -Weight Loss and Dietary Counseling given -Patient is currently on Ozempic and Phentermine in the outpatient setting which can be continued in the outpatient setting   DVT prophylaxis: Anticoagulated with Apixaban    Code Status: Full Code Family Communication: No family present at bedside   Disposition Plan:  Level of care: Telemetry Medical Status is: Inpatient Remains inpatient appropriate because: She continues to be short of breath and needs further clinical improvement prior to safe discharge disposition   Consultants:  Pulmonary  Procedures:  As delineated as above  Antimicrobials:  Anti-infectives (From admission, onward)    Start     Dose/Rate Route Frequency Ordered Stop   04/28/22 1800  cefTRIAXone (ROCEPHIN) 2 g in sodium chloride 0.9 % 100 mL IVPB        2 g 200 mL/hr over 30 Minutes Intravenous Every 24 hours 04/28/22 1759  Subjective: Seen and examined at bedside and the patient is that her shortness of breath is doing a little bit better but she continues to have some.  Wants to speak with pulmonary.  Denies any nausea or vomiting.  Denies any chest pain lightheadedness or dizziness.  Objective: Vitals:   04/29/22 0030 04/29/22 0330 04/29/22 0810 04/29/22 0810  BP:  (!) 146/86 (!) 117/59   Pulse: 89 95 69   Resp: 13 (!) 25 17   Temp:  98.4 F (36.9 C)  98.1 F (36.7 C)  TempSrc:  Oral  Oral  SpO2: 98% 100% 98%    No intake or output data in the 24 hours ending 04/29/22 0932 There were no vitals filed for  this visit.  Examination: Physical Exam:  Constitutional: WN/WD super morbidly obese African-American female in no acute distress Respiratory: Diminished to auscultation bilaterally with coarse breath sounds and some wheezing, no wheezing, rales, rhonchi. Normal respiratory effort and patient is not tachypenic. No accessory muscle use.  Unlabored breathing Cardiovascular: RRR, no murmurs / rubs / gallops. S1 and S2 auscultated.  Has mild lower extremity edema Abdomen: Soft, non-tender, distended secondary to body habitus. Bowel sounds positive.  GU: Deferred. Musculoskeletal: No clubbing / cyanosis of digits/nails. No joint deformity upper and lower extremities. .  Skin: No rashes, lesions, ulcers on limited skin evaluation. No induration; Warm and dry.  Neurologic: CN 2-12 grossly intact with no focal deficits. Romberg sign and cerebellar reflexes not assessed.  Psychiatric: Normal judgment and insight. Alert and oriented x 3. Normal mood and appropriate affect.   Data Reviewed: I have personally reviewed following labs and imaging studies  CBC: Recent Labs  Lab 04/28/22 0810 04/28/22 1044 04/29/22 0603  WBC 14.8*  --  14.2*  NEUTROABS 6.5  --   --   HGB 10.8* 11.9* 10.2*  HCT 37.1 35.0* 34.2*  MCV 88.5  --  85.7  PLT 350  --  299   Basic Metabolic Panel: Recent Labs  Lab 04/28/22 0810 04/28/22 1044 04/29/22 0603  NA 136 139 137  K 3.7 3.6 3.7  CL 110  --  110  CO2 22  --  17*  GLUCOSE 113*  --  108*  BUN 6  --  14  CREATININE 0.73  --  0.89  CALCIUM 8.7*  --  8.4*   GFR: CrCl cannot be calculated (Unknown ideal weight.). Liver Function Tests: No results for input(s): "AST", "ALT", "ALKPHOS", "BILITOT", "PROT", "ALBUMIN" in the last 168 hours. No results for input(s): "LIPASE", "AMYLASE" in the last 168 hours. No results for input(s): "AMMONIA" in the last 168 hours. Coagulation Profile: No results for input(s): "INR", "PROTIME" in the last 168 hours. Cardiac  Enzymes: No results for input(s): "CKTOTAL", "CKMB", "CKMBINDEX", "TROPONINI" in the last 168 hours. BNP (last 3 results) No results for input(s): "PROBNP" in the last 8760 hours. HbA1C: No results for input(s): "HGBA1C" in the last 72 hours. CBG: No results for input(s): "GLUCAP" in the last 168 hours. Lipid Profile: No results for input(s): "CHOL", "HDL", "LDLCALC", "TRIG", "CHOLHDL", "LDLDIRECT" in the last 72 hours. Thyroid Function Tests: No results for input(s): "TSH", "T4TOTAL", "FREET4", "T3FREE", "THYROIDAB" in the last 72 hours. Anemia Panel: No results for input(s): "VITAMINB12", "FOLATE", "FERRITIN", "TIBC", "IRON", "RETICCTPCT" in the last 72 hours. Sepsis Labs: Recent Labs  Lab 04/28/22 1029  PROCALCITON <0.10    Recent Results (from the past 240 hour(s))  Resp panel by RT-PCR (RSV, Flu A&B, Covid) Anterior Nasal Swab  Status: None   Collection Time: 04/28/22  8:20 AM   Specimen: Anterior Nasal Swab  Result Value Ref Range Status   SARS Coronavirus 2 by RT PCR NEGATIVE NEGATIVE Final    Comment: (NOTE) SARS-CoV-2 target nucleic acids are NOT DETECTED.  The SARS-CoV-2 RNA is generally detectable in upper respiratory specimens during the acute phase of infection. The lowest concentration of SARS-CoV-2 viral copies this assay can detect is 138 copies/mL. A negative result does not preclude SARS-Cov-2 infection and should not be used as the sole basis for treatment or other patient management decisions. A negative result may occur with  improper specimen collection/handling, submission of specimen other than nasopharyngeal swab, presence of viral mutation(s) within the areas targeted by this assay, and inadequate number of viral copies(<138 copies/mL). A negative result must be combined with clinical observations, patient history, and epidemiological information. The expected result is Negative.  Fact Sheet for Patients:   EntrepreneurPulse.com.au  Fact Sheet for Healthcare Providers:  IncredibleEmployment.be  This test is no t yet approved or cleared by the [place] FDA and  has been authorized for detection and/or diagnosis of SARS-CoV-2 by FDA under an Emergency Use Authorization (EUA). This EUA will remain  in effect (meaning this test can be used) for the duration of the COVID-19 declaration under Section 564(b)(1) of the Act, 21 U.S.C.section 360bbb-3(b)(1), unless the authorization is terminated  or revoked sooner.       Influenza A by PCR NEGATIVE NEGATIVE Final   Influenza B by PCR NEGATIVE NEGATIVE Final    Comment: (NOTE) The Xpert Xpress SARS-CoV-2/FLU/RSV plus assay is intended as an aid in the diagnosis of influenza from Nasopharyngeal swab specimens and should not be used as a sole basis for treatment. Nasal washings and aspirates are unacceptable for Xpert Xpress SARS-CoV-2/FLU/RSV testing.  Fact Sheet for Patients: EntrepreneurPulse.com.au  Fact Sheet for Healthcare Providers: IncredibleEmployment.be  This test is not yet approved or cleared by the [place] FDA and has been authorized for detection and/or diagnosis of SARS-CoV-2 by FDA under an Emergency Use Authorization (EUA). This EUA will remain in effect (meaning this test can be used) for the duration of the COVID-19 declaration under Section 564(b)(1) of the Act, 21 U.S.C. section 360bbb-3(b)(1), unless the authorization is terminated or revoked.     Resp Syncytial Virus by PCR NEGATIVE NEGATIVE Final    Comment: (NOTE) Fact Sheet for Patients: EntrepreneurPulse.com.au  Fact Sheet for Healthcare Providers: IncredibleEmployment.be  This test is not yet approved or cleared by the [place] FDA and has been authorized for detection and/or diagnosis of SARS-CoV-2 by FDA under an Emergency Use  Authorization (EUA). This EUA will remain in effect (meaning this test can be used) for the duration of the COVID-19 declaration under Section 564(b)(1) of the Act, 21 U.S.C. section 360bbb-3(b)(1), unless the authorization is terminated or revoked.  Performed at [facility], [place] [address], [place], [place] [address]     Radiology Studies: DG Chest Cash 1 View  Result Date: 04/28/2022 CLINICAL DATA:  52 year old female with history of shortness of breath. EXAM: PORTABLE CHEST 1 VIEW COMPARISON:  Chest x-ray 04/09/2022. FINDINGS: Lung volumes are low. No consolidative airspace disease. No pleural effusions. No pneumothorax. No pulmonary nodule or mass noted. Pulmonary vasculature and the cardiomediastinal silhouette are within normal limits. IMPRESSION: 1. Low lung volumes without radiographic evidence of acute cardiopulmonary disease. Electronically Signed   By: Vinnie Langton M.D.   On: 04/28/2022 08:40    Scheduled  Meds:  amLODipine  10 mg Oral Daily   carvedilol  6.25 mg Oral BID WC   cloNIDine  0.2 mg Oral BID   famotidine  40 mg Oral Daily   fluticasone furoate-vilanterol  1 puff Inhalation Daily   guaiFENesin  1,200 mg Oral BID   ipratropium-albuterol  3 mL Nebulization QID   levothyroxine  50 mcg Oral Daily   methylPREDNISolone (SOLU-MEDROL) injection  60 mg Intravenous Q12H   pregabalin  300 mg Oral TID   topiramate  200 mg Oral QHS   zolpidem  10 mg Oral QHS   Continuous Infusions:  cefTRIAXone (ROCEPHIN)  IV Stopped (04/28/22 1946)    LOS: 1 day   Raiford Noble, DO [facility] Available via Epic secure chat 7am-7pm After these hours, please refer to coverage provider listed on [email or web address] 04/29/2022, 9:32 AM

## 2022-04-30 ENCOUNTER — Telehealth (HOSPITAL_COMMUNITY): Payer: Self-pay | Admitting: *Deleted

## 2022-04-30 ENCOUNTER — Ambulatory Visit (HOSPITAL_COMMUNITY): Payer: Medicare Other | Admitting: Licensed Clinical Social Worker

## 2022-04-30 ENCOUNTER — Telehealth: Payer: Self-pay | Admitting: Internal Medicine

## 2022-04-30 DIAGNOSIS — J441 Chronic obstructive pulmonary disease with (acute) exacerbation: Secondary | ICD-10-CM | POA: Diagnosis not present

## 2022-04-30 DIAGNOSIS — D509 Iron deficiency anemia, unspecified: Secondary | ICD-10-CM | POA: Diagnosis not present

## 2022-04-30 DIAGNOSIS — I10 Essential (primary) hypertension: Secondary | ICD-10-CM | POA: Diagnosis not present

## 2022-04-30 DIAGNOSIS — D72829 Elevated white blood cell count, unspecified: Secondary | ICD-10-CM | POA: Diagnosis not present

## 2022-04-30 LAB — CBC WITH DIFFERENTIAL/PLATELET
Abs Immature Granulocytes: 0.07 10*3/uL (ref 0.00–0.07)
Basophils Absolute: 0 10*3/uL (ref 0.0–0.1)
Basophils Relative: 0 %
Eosinophils Absolute: 0 10*3/uL (ref 0.0–0.5)
Eosinophils Relative: 0 %
HCT: 35.2 % — ABNORMAL LOW (ref 36.0–46.0)
Hemoglobin: 10.8 g/dL — ABNORMAL LOW (ref 12.0–15.0)
Immature Granulocytes: 1 %
Lymphocytes Relative: 14 %
Lymphs Abs: 1.8 10*3/uL (ref 0.7–4.0)
MCH: 26 pg (ref 26.0–34.0)
MCHC: 30.7 g/dL (ref 30.0–36.0)
MCV: 84.6 fL (ref 80.0–100.0)
Monocytes Absolute: 0.5 10*3/uL (ref 0.1–1.0)
Monocytes Relative: 4 %
Neutro Abs: 10.4 10*3/uL — ABNORMAL HIGH (ref 1.7–7.7)
Neutrophils Relative %: 81 %
Platelets: 383 10*3/uL (ref 150–400)
RBC: 4.16 MIL/uL (ref 3.87–5.11)
RDW: 19.2 % — ABNORMAL HIGH (ref 11.5–15.5)
WBC: 12.8 10*3/uL — ABNORMAL HIGH (ref 4.0–10.5)
nRBC: 0 % (ref 0.0–0.2)

## 2022-04-30 LAB — PHOSPHORUS: Phosphorus: 2.5 mg/dL (ref 2.5–4.6)

## 2022-04-30 LAB — COMPREHENSIVE METABOLIC PANEL
ALT: 14 U/L (ref 0–44)
AST: 18 U/L (ref 15–41)
Albumin: 3.1 g/dL — ABNORMAL LOW (ref 3.5–5.0)
Alkaline Phosphatase: 62 U/L (ref 38–126)
Anion gap: 9 (ref 5–15)
BUN: 21 mg/dL — ABNORMAL HIGH (ref 6–20)
CO2: 18 mmol/L — ABNORMAL LOW (ref 22–32)
Calcium: 8.5 mg/dL — ABNORMAL LOW (ref 8.9–10.3)
Chloride: 106 mmol/L (ref 98–111)
Creatinine, Ser: 0.91 mg/dL (ref 0.44–1.00)
GFR, Estimated: >60 mL/min (ref >60)
Glucose, Bld: 152 mg/dL — ABNORMAL HIGH (ref 70–99)
Potassium: 3.9 mmol/L (ref 3.5–5.1)
Sodium: 133 mmol/L — ABNORMAL LOW (ref 135–145)
Total Bilirubin: 0.1 mg/dL — ABNORMAL LOW (ref 0.3–1.2)
Total Protein: 7.1 g/dL (ref 6.5–8.1)

## 2022-04-30 LAB — MAGNESIUM: Magnesium: 2.2 mg/dL (ref 1.7–2.4)

## 2022-04-30 MED ORDER — MONTELUKAST SODIUM 10 MG PO TABS
10.0000 mg | ORAL_TABLET | Freq: Every day | ORAL | Status: DC
  Administered 2022-04-30 – 2022-05-01 (×2): 10 mg via ORAL
  Filled 2022-04-30 (×2): qty 1

## 2022-04-30 MED ORDER — DOXYCYCLINE HYCLATE 100 MG PO TABS
100.0000 mg | ORAL_TABLET | Freq: Two times a day (BID) | ORAL | Status: DC
  Administered 2022-04-30 – 2022-05-02 (×5): 100 mg via ORAL
  Filled 2022-04-30 (×5): qty 1

## 2022-04-30 MED ORDER — ORAL CARE MOUTH RINSE: 15.0000 mL | OROMUCOSAL | Status: DC | PRN

## 2022-04-30 MED ORDER — SODIUM BICARBONATE 650 MG PO TABS
650.0000 mg | ORAL_TABLET | Freq: Two times a day (BID) | ORAL | Status: DC
  Administered 2022-04-30 – 2022-05-02 (×5): 650 mg via ORAL
  Filled 2022-04-30 (×5): qty 1

## 2022-04-30 NOTE — Progress Notes (Signed)
Mobility Specialist Progress Note    04/30/22 1045  Mobility  Activity Ambulated independently in hallway  Level of Assistance Standby assist, set-up cues, supervision of patient - no hands on  Assistive Device None  Distance Ambulated (ft) 220 ft  Activity Response Tolerated fair  Mobility Referral Yes  $Mobility charge 1 Mobility   Pre-Mobility: 90 HR, 90% SpO2 During Mobility: 92 HR Post-Mobility: 69 HR  Pt received returning to EOB from BR and agreeable. Took an extended seated rest break as pt stating she felt out of breath. On RA. SpO2 unreliable. C/o 8/10 BLE pain. Returned to sitting EOB with call bell in reach. RN notified.   Hildred Alamin Mobility Specialist  Please Psychologist, sport and exercise or Rehab Office at [phone]

## 2022-04-30 NOTE — Progress Notes (Signed)
PROGRESS NOTE    April [name]  DXI:[id number] DOB: 1971/03/28 DOA: 04/28/2022 PCP: [name], MD   Brief Narrative:  HPI per Dr. Fuller Plan on 04/28/22 April [name] is a 52 y.o. female with medical history significant of  severe asthma/COPD, HTN, chronic diastolic CHF, PAF, anxiety/depression, morbid obesity, OSA, chronic pain, and RA presents with complaints of worsening shortness of breath over the last week.  She reports having a productive cough with congestion, rhinorrhea, and wheezing.  She had tried home medications breathing treatments and inhalers without improvement in symptoms.  Patient notes that recently she has been using 4 to 5 L of oxygen at night and 3 L during the day while she has been sick.  Denies having any significant fever or issues with bleeding, but has had subjective hot and cold flashes.  This the patient's third admission into the hospital with similar symptoms this month.  She questions if symptoms or onset by possible mold in the apartment where she is staying.  She had notified the landlord and states that she thinks they just painted over the area in the bathroom.  Furthermore she follows with Dr. Annamaria Boots of pulmonology in the outpatient setting and she thinks that ever since she stopped doing the [place] treatments in clinic she has been having more frequent exacerbations.  She states that she has not been around secondhand smoke or any recent sick contacts.  Patient reports that she cannot use the CPAP machine.  Mrs. Randon Goldsmith reports having pain all over for which she is on chronic pain medication.   In route with EMS patient was noted to have wheezing in upper lung fields.  She had been given DuoNeb breathing treatment and Solu-Medrol 120 mg IV prior to arrival.   In the emergency department patient was noted to be afebrile with pulse 84-1 12, respirations 22-24, blood pressures 104/83 to 156/81, and O2 saturations currently maintained greater than 90% on 4 L of  nasal cannula oxygen.  Labs significant for WBC 14.8, hemoglobin 10.8, and BNP 58.9.  Influenza, COVID-19, and RSV screen were negative.   **Interim History  Given her frequent COPD exacerbations pulmonary was consulted.  Pulmonary has met and recommended triple therapy nebs with Pulmicort, Brovana and Yupelri as well as continue IV steroids and hoping that her tezepelumab gets approved.  See below and will continue current nebs and treatment and if improving can transition to oral prednisone 40 mg for a week and then changing to 20 mg/day.  Pulmonary recommends continuing IV steroids through today and starting Singulair and following up in 2 weeks with clinic  Assessment and Plan:  Acute on Chronic Respiratory failure with hypoxia secondary to asthma with COPD exacerbation -Acute on chronic.  Patient presents with complaints of worsening shortness of breath with cough and wheezing. -Noted to have significant wheezing and upper lung fields on physical exam.   -O2 saturations currently maintained on 4 L nasal cannula oxygen.   -SpO2: 100 % O2 Flow Rate (L/min): 4 L/min -Venous pH did not show any significant signs of hypercapnia.  Patient had received 125 mg of Solu-Medrol IV route with EMS and DuoNeb breathing treatments.  This is patient's third admission for similar symptoms this month.  Unclear if provoked by reports of possible mold in her home. -Admit to a medical telemetry bed -COPD order set utilized and now medications have been adjusted  -Continuous pulse oximetry with nasal cannula oxygen maintain O2 saturation greater than 92% -Check procalcitonin was  less than 0.10 -She is on DuoNeb 4 times daily but pulmonary is now recommending changing and adding Yupelri 175 mg nebs daily as well as Brovana and budesonide and they are recommending continue nebs and starting Singulair -Continue with guaifenesin 1200 was p.o. twice daily, flutter valve as well as incentive spirometry -Continue  pharmacy substitution of Breo but have now continued -Prednisone 40 mg daily now changed to IV Solu-Medrol 60 mg every 12h and pulmonary recommending continuing IV steroids through today and going home on 40 mg of prednisone for a week and then changed to 20 mg/day until she follows up in office -Rocephin 2 g IV will be continued and is being changed to Doxycyline for 5 days -Continue with pulmonary toilet -Recommend outpatient follow-up with Dr. Annamaria Boots of pulmonology in the outpatient setting once stable for discharge but will consult inpatient pulmonology for further evaluation recommendations -She will need an ambulatory home O2 screen as well as a repeat chest x-ray in a.m.; continue up and out of bed and up in chair    Leukocytosis Chronic.  WBC elevated at 14.8, but appears to be chronically elevated over the last several years.  Question if secondary to chronic pain medication or frequent steroids. Recent Labs  Lab 04/04/22 0535 04/05/22 0409 04/06/22 0406 04/09/22 0724 04/28/22 0810 04/29/22 0603 04/30/22 0025  WBC 19.3* 16.9* 16.8* 19.4* 14.8* 14.2* 12.8*  -Continue to monitor   Essential hypertension -Blood pressures currently maintained. -Continue current home medication regimen with amlodipine 10 mg p.o. daily, clonidine 0.2 mg p.o. twice daily as well as carvedilol 6.25 mg p.o. twice daily -Continue monitor blood pressures per protocol -Last blood pressure reading was 126/72   Paroxysmal atrial fibrillation on chronic anticoagulation -Currently sinus rhythm. -Continue Carvedilol 6.25 mg po BID and Apixaban 5 mg po BID -Continue to Monitor On Telemetry    Chronic Diastolic Heart Failure -Patient appears to be compensated.  BNP 58.9. -Resume Home Diuretics -Strict I's and O's and Daily Weights; Patient is +2.123 -Continue to Monitor for S/Sx of Volume Overload   Hypothyroidism TSH was noted to be 2.686 on 10/23/2021. -Continue Levothyroxine   Anxiety and  Depression -Continue Fluoxetine 20 mg po Daily and Clonazepam as needed   Obstructive of sleep apnea/Possibly obesity hypoventilation syndrome -Patient has not been using CPAP at night.   Metabolic Acidosis -Patient's CO2 is now 18, AG is 9, Chloride Level is 106 -Continue to Monitor and Trend and Repeat CMP in the AM   Somnolence -Patient was a little bit sleepy this morning but could be related to opiates but is able to answer questions appropriately -If worsening will obtain an ABG -Continue to monitor and evaluate closely  Hyponatremia -Mild. Na+ Trend: Recent Labs  Lab 04/09/22 0724 04/09/22 0809 04/13/22 0430 04/28/22 0810 04/28/22 1044 04/29/22 0603 04/30/22 0025  NA 141 141 140 136 139 137 133*  -Continue to Monitor and trend -Repeat CMP in the AM   Elevated BUN -In the setting of Steroids Recent Labs  Lab 04/05/22 0409 04/06/22 0406 04/09/22 0724 04/13/22 0430 04/28/22 0810 04/29/22 0603 04/30/22 0025  BUN _0 21*  -Continue to Monitor and Trend and repeat CMP in the AM   Hypoalbuminemia -Patients Albumin Level is now 3.1 -Continue to Monitor and Trend and Repeat CMP in the AM   Normocytic Hypochromic Anemia -Hgb/Hct Trend: Recent Labs  Lab 04/06/22 0406 04/09/22 0724 04/09/22 0809 04/28/22 0810 04/28/22 1044 04/29/22 0603 04/30/22 0025  HGB 11.5*  12.4 13.6 10.8* 11.9* 10.2* 10.8*  HCT 37.1 39.5 40.0 37.1 35.0* 34.2* 35.2*  MCV 83.2 82.0  --  88.5  --  85.7 84.6  -Check Anemia Panel in the AM  -Continue to Monitor for S/Sx of Bleeding; NO overt Bleeding Noted -Repeat CBC in the AM    Super Morbid Obesity -Complicates overall prognosis and care -Estimated body mass index is 51.6 kg/m as calculated from the following:   Height as of 04/09/22: _0  (1.676 m).   Weight as of 04/09/22: 145 kg.  -Weight Loss and Dietary Counseling given -Patient is currently on Ozempic and Phentermine in the outpatient setting which can be  continued in the outpatient setting   DVT prophylaxis:  apixaban (ELIQUIS) tablet 5 mg    Code Status: Full Code Family Communication: Discussed with boyfriend at bedside   Disposition Plan:  Level of care: Telemetry Medical Status is: Inpatient Remains inpatient appropriate because: Needs further clinical improvement and if doing better, anticipate discharging home in the next 24 to 48 hours   Consultants:  Pulmonary  Procedures:  As delineated as above  Antimicrobials:  Anti-infectives (From admission, onward)    Start     Dose/Rate Route Frequency Ordered Stop   04/30/22 1115  doxycycline (VIBRA-TABS) tablet 100 mg        100 mg Oral Every 12 hours 04/30/22 1029 05/05/22 0959   04/28/22 1800  cefTRIAXone (ROCEPHIN) 2 g in sodium chloride 0.9 % 100 mL IVPB  Status:  Discontinued        2 g 200 mL/hr over 30 Minutes Intravenous Every 24 hours 04/28/22 1759 04/30/22 1029       Subjective: Seen and examined at bedside and he was doing okay and may be slightly better.  Continues to be short of breath and was a little somnolent this morning but easily arousable.  Denies any complaints and was asking for pain medicine.  No nausea or vomiting.  No other concerns or complaints at this time.  Objective: Vitals:   04/30/22 0822 04/30/22 1128 04/30/22 1542 04/30/22 1551  BP:  133/78 126/72   Pulse:  68 60   Resp:  19 (!) 21   Temp:   98.1 F (36.7 C)   TempSrc:  Oral Oral   SpO2: 100% 100% 100% 100%    Intake/Output Summary (Last 24 hours) at 04/30/2022 1607 Last data filed at 04/30/2022 0730 Gross per 24 hour  Intake 1990 ml  Output 2 ml  Net 1988 ml   There were no vitals filed for this visit.  Examination: Physical Exam:  Constitutional: WN/WD super morbidly obese African-American female currently no acute distress Respiratory: Diminished to auscultation bilaterally with coarse sounds with some wheezing and rhonchi but no appreciable rales or crackles. Normal  respiratory effort and patient is not tachypenic. No accessory muscle use. Wearing Supplemental O2 via [place] Cardiovascular: RRR, no murmurs / rubs / gallops. S1 and S2 auscultated. No extremity edema.  Abdomen: Soft, non-tender, Distended 2/2 body habitus. Bowel sounds positive.  GU: Deferred. Musculoskeletal: No clubbing / cyanosis of digits/nails. No joint deformity upper and lower extremities.  Skin: No rashes, lesions, ulcers on a limited skin evaluation. No induration; Warm and dry.  Neurologic: CN 2-12 grossly intact with no focal deficits but a little somnolent and drowsy. Romberg sign and cerebellar reflexes not assessed.  Psychiatric: Normal mood and affect  Data Reviewed: I have personally reviewed following labs and imaging studies  CBC: Recent Labs  Lab 04/28/22  1941 04/28/22 1044 04/29/22 0603 04/30/22 0025  WBC 14.8*  --  14.2* 12.8*  NEUTROABS 6.5  --   --  10.4*  HGB 10.8* 11.9* 10.2* 10.8*  HCT 37.1 35.0* 34.2* 35.2*  MCV 88.5  --  85.7 84.6  PLT 350  --  352 740   Basic Metabolic Panel: Recent Labs  Lab 04/28/22 0810 04/28/22 1044 04/29/22 0603 04/30/22 0025  NA 136 139 137 133*  K 3.7 3.6 3.7 3.9  CL 110  --  110 106  CO2 22  --  17* 18*  GLUCOSE 113*  --  108* 152*  BUN 6  --  14 21*  CREATININE 0.73  --  0.89 0.91  CALCIUM 8.7*  --  8.4* 8.5*  MG  --   --   --  2.2  PHOS  --   --   --  2.5   GFR: CrCl cannot be calculated (Unknown ideal weight.). Liver Function Tests: Recent Labs  Lab 04/30/22 0025  AST 18  ALT 14  ALKPHOS 62  BILITOT 0.1*  PROT 7.1  ALBUMIN 3.1*   No results for input(s): "LIPASE", "AMYLASE" in the last 168 hours. No results for input(s): "AMMONIA" in the last 168 hours. Coagulation Profile: No results for input(s): "INR", "PROTIME" in the last 168 hours. Cardiac Enzymes: No results for input(s): "CKTOTAL", "CKMB", "CKMBINDEX", "TROPONINI" in the last 168 hours. BNP (last 3 results) No results for input(s): "PROBNP"  in the last 8760 hours. HbA1C: No results for input(s): "HGBA1C" in the last 72 hours. CBG: No results for input(s): "GLUCAP" in the last 168 hours. Lipid Profile: No results for input(s): "CHOL", "HDL", "LDLCALC", "TRIG", "CHOLHDL", "LDLDIRECT" in the last 72 hours. Thyroid Function Tests: No results for input(s): "TSH", "T4TOTAL", "FREET4", "T3FREE", "THYROIDAB" in the last 72 hours. Anemia Panel: No results for input(s): "VITAMINB12", "FOLATE", "FERRITIN", "TIBC", "IRON", "RETICCTPCT" in the last 72 hours. Sepsis Labs: Recent Labs  Lab 04/28/22 1029  PROCALCITON <0.10    Recent Results (from the past 240 hour(s))  Resp panel by RT-PCR (RSV, Flu A&B, Covid) Anterior Nasal Swab     Status: None   Collection Time: 04/28/22  8:20 AM   Specimen: Anterior Nasal Swab  Result Value Ref Range Status   SARS Coronavirus 2 by RT PCR NEGATIVE NEGATIVE Final    Comment: (NOTE) SARS-CoV-2 target nucleic acids are NOT DETECTED.  The SARS-CoV-2 RNA is generally detectable in upper respiratory specimens during the acute phase of infection. The lowest concentration of SARS-CoV-2 viral copies this assay can detect is 138 copies/mL. A negative result does not preclude SARS-Cov-2 infection and should not be used as the sole basis for treatment or other patient management decisions. A negative result may occur with  improper specimen collection/handling, submission of specimen other than nasopharyngeal swab, presence of viral mutation(s) within the areas targeted by this assay, and inadequate number of viral copies(<138 copies/mL). A negative result must be combined with clinical observations, patient history, and epidemiological information. The expected result is Negative.  Fact Sheet for Patients:  EntrepreneurPulse.com.au  Fact Sheet for Healthcare Providers:  IncredibleEmployment.be  This test is no t yet approved or cleared by the [place] FDA  and  has been authorized for detection and/or diagnosis of SARS-CoV-2 by FDA under an Emergency Use Authorization (EUA). This EUA will remain  in effect (meaning this test can be used) for the duration of the COVID-19 declaration under Section 564(b)(1) of the Act, 21 U.S.C.section 360bbb-3(b)(1),  unless the authorization is terminated  or revoked sooner.       Influenza A by PCR NEGATIVE NEGATIVE Final   Influenza B by PCR NEGATIVE NEGATIVE Final    Comment: (NOTE) The Xpert Xpress SARS-CoV-2/FLU/RSV plus assay is intended as an aid in the diagnosis of influenza from Nasopharyngeal swab specimens and should not be used as a sole basis for treatment. Nasal washings and aspirates are unacceptable for Xpert Xpress SARS-CoV-2/FLU/RSV testing.  Fact Sheet for Patients: EntrepreneurPulse.com.au  Fact Sheet for Healthcare Providers: IncredibleEmployment.be  This test is not yet approved or cleared by the [place] FDA and has been authorized for detection and/or diagnosis of SARS-CoV-2 by FDA under an Emergency Use Authorization (EUA). This EUA will remain in effect (meaning this test can be used) for the duration of the COVID-19 declaration under Section 564(b)(1) of the Act, 21 U.S.C. section 360bbb-3(b)(1), unless the authorization is terminated or revoked.     Resp Syncytial Virus by PCR NEGATIVE NEGATIVE Final    Comment: (NOTE) Fact Sheet for Patients: EntrepreneurPulse.com.au  Fact Sheet for Healthcare Providers: IncredibleEmployment.be  This test is not yet approved or cleared by the [place] FDA and has been authorized for detection and/or diagnosis of SARS-CoV-2 by FDA under an Emergency Use Authorization (EUA). This EUA will remain in effect (meaning this test can be used) for the duration of the COVID-19 declaration under Section 564(b)(1) of the Act, 21 U.S.C. section 360bbb-3(b)(1),  unless the authorization is terminated or revoked.  Performed at [facility], [place] [address], [place], [place] [address]   Respiratory (~20 pathogens) panel by PCR     Status: None   Collection Time: 04/28/22  8:20 AM   Specimen: Nasopharyngeal Swab; Respiratory  Result Value Ref Range Status   Adenovirus NOT DETECTED NOT DETECTED Final   Coronavirus 229E NOT DETECTED NOT DETECTED Final    Comment: (NOTE) The Coronavirus on the Respiratory Panel, DOES NOT test for the novel  Coronavirus (2019 nCoV)    Coronavirus HKU1 NOT DETECTED NOT DETECTED Final   Coronavirus NL63 NOT DETECTED NOT DETECTED Final   Coronavirus OC43 NOT DETECTED NOT DETECTED Final   Metapneumovirus NOT DETECTED NOT DETECTED Final   Rhinovirus / Enterovirus NOT DETECTED NOT DETECTED Final   Influenza A NOT DETECTED NOT DETECTED Final   Influenza B NOT DETECTED NOT DETECTED Final   Parainfluenza Virus 1 NOT DETECTED NOT DETECTED Final   Parainfluenza Virus 2 NOT DETECTED NOT DETECTED Final   Parainfluenza Virus 3 NOT DETECTED NOT DETECTED Final   Parainfluenza Virus 4 NOT DETECTED NOT DETECTED Final   Respiratory Syncytial Virus NOT DETECTED NOT DETECTED Final   Bordetella pertussis NOT DETECTED NOT DETECTED Final   Bordetella Parapertussis NOT DETECTED NOT DETECTED Final   Chlamydophila pneumoniae NOT DETECTED NOT DETECTED Final   Mycoplasma pneumoniae NOT DETECTED NOT DETECTED Final    Comment: Performed at [facility], [place]. [address] [place], [place] [address]  MRSA Next Gen by PCR, Nasal     Status: None   Collection Time: 04/29/22  2:35 PM   Specimen: Nasal Mucosa; Nasal Swab  Result Value Ref Range Status   MRSA by PCR Next Gen NOT DETECTED NOT DETECTED Final    Comment: (NOTE) The GeneXpert MRSA Assay (FDA approved for NASAL specimens only), is one component of a comprehensive MRSA colonization surveillance program. It is not intended to diagnose MRSA infection nor to guide or monitor  treatment for MRSA infections. Test  performance is not FDA approved in patients less than 14 years old. Performed at [facility], [place] [address], [place], [place] [address]     Radiology Studies: No results found.  Scheduled Meds:  amLODipine  10 mg Oral Daily   apixaban  5 mg Oral BID   arformoterol  15 mcg Nebulization BID   budesonide (PULMICORT) nebulizer solution  0.5 mg Nebulization BID   carvedilol  6.25 mg Oral BID WC   cloNIDine  0.2 mg Oral BID   doxycycline  100 mg Oral Q12H   famotidine  40 mg Oral Daily   FLUoxetine  20 mg Oral Daily   furosemide  20 mg Oral Daily   guaiFENesin  1,200 mg Oral BID   levothyroxine  50 mcg Oral Daily   methylPREDNISolone (SOLU-MEDROL) injection  60 mg Intravenous Q12H   montelukast  10 mg Oral QHS   pregabalin  300 mg Oral TID   revefenacin  175 mcg Nebulization Daily   sodium bicarbonate  650 mg Oral BID   topiramate  200 mg Oral QHS   zolpidem  10 mg Oral QHS   Continuous Infusions:   LOS: 2 days   Raiford Noble, DO [facility] Available via Epic secure chat 7am-7pm After these hours, please refer to coverage provider listed on [email or web address] 04/30/2022, 4:07 PM

## 2022-04-30 NOTE — Progress Notes (Signed)
NAME:  April [name], MRN:  [id number], DOB:  Aug 30, 1970, LOS: 2 ADMISSION DATE:  04/28/2022, CONSULTATION DATE: 04/29/2022 REFERRING MD:  Dr. Alfredia Ferguson, CHIEF COMPLAINT:  Asthma exacerbation    History of Present Illness:  This is a 52 year old female past medical history of severe asthma, COPD, hypertension, chronic diastolic heart failure, PAF, anxiety.,  Morbid obesity OSA chronic pain.  Patient uses 4 to 5 L home oxygen therapy.  She has had several exacerbations in the past several months.  She has been on other biologic injections in the past.  Currently seen in our office by Dr. Annamaria Boots.  She has had more frequent exacerbations after stopping tezepelumab.  Currently has an urgent appeal to her insurance company for prior authorization is to restart tezepelumab.  States that she has not been smoking.  Has not been around secondhand smoke.  Patient was admitted after being evaluated in the emergency department by hospitalist service.  Her influenza COVID-19 and RSV screening was negative.  Pertinent  Medical History   Past Medical History:  Diagnosis Date   Acanthosis nigricans    Anxiety    Arthritis    "knees" (04/28/2017)   Asthma    Followed by Dr. Annamaria Boots (pulmonology); receives every other week omalizumab injections; has frequent exacerbations   Back pain    Chronic diastolic CHF (congestive heart failure) ([place]) 01/17/2017   COPD (chronic obstructive pulmonary disease) ([place])    PFTs in 2002, FEV1/FVC 65, no post bronchodilater test done   Depression    GERD (gastroesophageal reflux disease)    Headache(784.0)    "q couple days" (79/89/2119)   Helicobacter pylori (H. pylori) infection    Hypertension, essential    Insomnia    Joint pain    Lower extremity edema    Menorrhagia    Morbid obesity ([place])    OSA on CPAP    Sleep study 2008 - mild OSA, not enough events to titrate CPAP; wears CPAP now/pt on 04/28/2017   Pneumonia X 1   Prediabetes    Rheumatoid arthritis (HCC)     Seasonal allergies    Shortness of breath    Tobacco user    Vitamin D deficiency      Significant Hospital Events: Including procedures, antibiotic start and stop dates in addition to other pertinent events     Interim History / Subjective:  No acute distress at rest  Objective   Blood pressure (!) 149/80, pulse 72, temperature 98.1 F (36.7 C), temperature source Oral, resp. rate 14, last menstrual period 03/14/2022, SpO2 100 %.        Intake/Output Summary (Last 24 hours) at 04/30/2022 0908 Last data filed at 04/30/2022 0730 Gross per 24 hour  Intake 2125 ml  Output 2 ml  Net 2123 ml   There were no vitals filed for this visit.  Examination: General: Morbidly obese female no acute distress HEENT: MM pink/moist Short neck no JVD Neuro: Grossly intact CV: Heart sounds are distant PULM: Diminished throughout, currently on 4 L nasal cannula O2 saturations at 100% she normally only uses O2 at nighttime GI: soft, bsx4 active, obese GU: Voids Extremities: warm/dry, 1+ edema  Skin: no rashes or lesions   Resolved Hospital Problem list     Assessment & Plan:   Acute on chronic hypoxemic respiratory failure related to asthma/COPD exacerbation Complicated by obstructive sleep apnea and noncompliant with CPAP utilizes oxygen instead Further complicated by benzodiazepines and narcotics Morbidly obese Plan: Continue Pulmicort [name] and Yupelri Currently  on Solu-Medrol 40 mg twice a day consider transitioning to prednisone Hopefully the office will be able to get her tezepelumab approved. Triple therapy nebs, Pulmicort, Brovana,  IV steroids 40 mg twice daily She will need to remain at least 20 mg daily Follow-up in pulmonary office on discharge Mobilize  Anemia Hypertension Paroxysmal atrial fibrillation on anticoagulation Chronic diastolic heart failure Hypothyroidism Anxiety depression Morbid obesity - Per primary   Labs   CBC: Recent Labs  Lab  04/28/22 0810 04/28/22 1044 04/29/22 0603 04/30/22 0025  WBC 14.8*  --  14.2* 12.8*  NEUTROABS 6.5  --   --  10.4*  HGB 10.8* 11.9* 10.2* 10.8*  HCT 37.1 35.0* 34.2* 35.2*  MCV 88.5  --  85.7 84.6  PLT 350  --  352 383     Basic Metabolic Panel: Recent Labs  Lab 04/28/22 0810 04/28/22 1044 04/29/22 0603 04/30/22 0025  NA 136 139 137 133*  K 3.7 3.6 3.7 3.9  CL 110  --  110 106  CO2 22  --  17* 18*  GLUCOSE 113*  --  108* 152*  BUN 6  --  14 21*  CREATININE 0.73  --  0.89 0.91  CALCIUM 8.7*  --  8.4* 8.5*  MG  --   --   --  2.2  PHOS  --   --   --  2.5    GFR: CrCl cannot be calculated (Unknown ideal weight.). Recent Labs  Lab 04/28/22 0810 04/28/22 1029 04/29/22 0603 04/30/22 0025  PROCALCITON  --  <0.10  --   --   WBC 14.8*  --  14.2* 12.8*     Liver Function Tests: Recent Labs  Lab 04/30/22 0025  AST 18  ALT 14  ALKPHOS 62  BILITOT 0.1*  PROT 7.1  ALBUMIN 3.1*   No results for input(s): "LIPASE", "AMYLASE" in the last 168 hours. No results for input(s): "AMMONIA" in the last 168 hours.  ABG    Component Value Date/Time   PHART 7.28 (L) 01/08/2022 0000   PCO2ART 49 (H) 01/08/2022 0000   PO2ART 33 (LL) 01/08/2022 0000   HCO3 18.2 (L) 04/28/2022 1044   TCO2 19 (L) 04/28/2022 1044   ACIDBASEDEF 8.0 (H) 04/28/2022 1044   O2SAT 64 04/28/2022 1044     Coagulation Profile: No results for input(s): "INR", "PROTIME" in the last 168 hours.  Cardiac Enzymes: No results for input(s): "CKTOTAL", "CKMB", "CKMBINDEX", "TROPONINI" in the last 168 hours.  HbA1C: HbA1c, POC (prediabetic range)  Date/Time Value Ref Range Status  03/24/2019 04:08 PM 5.6 (A) 5.7 - 6.4 % Final   HbA1c, POC (controlled diabetic range)  Date/Time Value Ref Range Status  03/11/2022 02:49 PM 5.5 0.0 - 7.0 % Final  11/28/2021 01:49 PM 6.3 0.0 - 7.0 % Final    CBG: CBG (last 3)  No results for input(s): "GLUCAP" in the last 72 hours.   Richardson Landry Markey Deady ACNP Acute Care  Nurse Practitioner [place] Please consult Amion 04/30/2022, 9:09 AM

## 2022-04-30 NOTE — Telephone Encounter (Signed)
Patient called stating that she is in the hospital and will not make her scheduled appointment for 04/30/22 at 3pm.

## 2022-04-30 NOTE — Progress Notes (Signed)
Nutrition Brief Note  RD consulted as part of the COPD Protocol. Spoke with pt at bedside. Pt reports good appetite and good PO intake. She reports eating most of her breakfast and lunch today. She feels like she is getting plenty to eat and is worried about overeating. Pt reports that her weight has been stable and denies any weight loss. Pt denies any nutrition-related issues at this time.  Wt Readings from Last 15 Encounters:  04/09/22 (!) 145 kg  04/05/22 (!) 144.5 kg  03/11/22 (!) 141.7 kg  01/30/22 (!) 152 kg  01/28/22 (!) 151.3 kg  01/13/22 (!) 147.9 kg  11/28/21 (!) 153.9 kg  11/13/21 (!) 153.8 kg  10/18/21 (!) 153.2 kg  10/08/21 (!) 146.9 kg  09/07/21 (!) 147.9 kg  08/30/21 (!) 150.2 kg  08/24/21 (!) 148.1 kg  08/02/21 (!) 148.1 kg  07/31/21 (!) 145.6 kg   Current diet order is Regular, patient is consuming approximately 100% of meals at this time. Labs and medications reviewed.   No nutrition interventions warranted at this time. If nutrition issues arise, please consult RD.    Gustavus Bryant, MS, RD, LDN Inpatient Clinical Dietitian Please see AMiON for contact information.

## 2022-05-01 ENCOUNTER — Inpatient Hospital Stay (HOSPITAL_COMMUNITY): Payer: Medicare Other

## 2022-05-01 ENCOUNTER — Telehealth: Payer: Self-pay | Admitting: Family Medicine

## 2022-05-01 DIAGNOSIS — J45901 Unspecified asthma with (acute) exacerbation: Secondary | ICD-10-CM | POA: Diagnosis not present

## 2022-05-01 DIAGNOSIS — J441 Chronic obstructive pulmonary disease with (acute) exacerbation: Secondary | ICD-10-CM | POA: Diagnosis not present

## 2022-05-01 LAB — CBC WITH DIFFERENTIAL/PLATELET
Abs Immature Granulocytes: 0.07 10*3/uL (ref 0.00–0.07)
Basophils Absolute: 0 10*3/uL (ref 0.0–0.1)
Basophils Relative: 0 %
Eosinophils Absolute: 0 10*3/uL (ref 0.0–0.5)
Eosinophils Relative: 0 %
HCT: 36.6 % (ref 36.0–46.0)
Hemoglobin: 11.1 g/dL — ABNORMAL LOW (ref 12.0–15.0)
Immature Granulocytes: 1 %
Lymphocytes Relative: 13 %
Lymphs Abs: 2 10*3/uL (ref 0.7–4.0)
MCH: 25.7 pg — ABNORMAL LOW (ref 26.0–34.0)
MCHC: 30.3 g/dL (ref 30.0–36.0)
MCV: 84.7 fL (ref 80.0–100.0)
Monocytes Absolute: 0.8 10*3/uL (ref 0.1–1.0)
Monocytes Relative: 6 %
Neutro Abs: 12 10*3/uL — ABNORMAL HIGH (ref 1.7–7.7)
Neutrophils Relative %: 80 %
Platelets: 393 10*3/uL (ref 150–400)
RBC: 4.32 MIL/uL (ref 3.87–5.11)
RDW: 19 % — ABNORMAL HIGH (ref 11.5–15.5)
WBC: 15 10*3/uL — ABNORMAL HIGH (ref 4.0–10.5)
nRBC: 0 % (ref 0.0–0.2)

## 2022-05-01 LAB — COMPREHENSIVE METABOLIC PANEL
ALT: 18 U/L (ref 0–44)
AST: 24 U/L (ref 15–41)
Albumin: 3.1 g/dL — ABNORMAL LOW (ref 3.5–5.0)
Alkaline Phosphatase: 63 U/L (ref 38–126)
Anion gap: 8 (ref 5–15)
BUN: 24 mg/dL — ABNORMAL HIGH (ref 6–20)
CO2: 19 mmol/L — ABNORMAL LOW (ref 22–32)
Calcium: 8.5 mg/dL — ABNORMAL LOW (ref 8.9–10.3)
Chloride: 108 mmol/L (ref 98–111)
Creatinine, Ser: 0.93 mg/dL (ref 0.44–1.00)
GFR, Estimated: >60 mL/min (ref >60)
Glucose, Bld: 152 mg/dL — ABNORMAL HIGH (ref 70–99)
Potassium: 3.7 mmol/L (ref 3.5–5.1)
Sodium: 135 mmol/L (ref 135–145)
Total Bilirubin: 0.3 mg/dL (ref 0.3–1.2)
Total Protein: 6.8 g/dL (ref 6.5–8.1)

## 2022-05-01 LAB — VITAMIN B12: Vitamin B-12: 459 pg/mL (ref 180–914)

## 2022-05-01 LAB — PHOSPHORUS: Phosphorus: 2.2 mg/dL — ABNORMAL LOW (ref 2.5–4.6)

## 2022-05-01 LAB — RETICULOCYTES
Immature Retic Fract: 23.3 % — ABNORMAL HIGH (ref 2.3–15.9)
RBC.: 4.25 MIL/uL (ref 3.87–5.11)
Retic Count, Absolute: 117.3 10*3/uL (ref 19.0–186.0)
Retic Ct Pct: 2.8 % (ref 0.4–3.1)

## 2022-05-01 LAB — FOLATE: Folate: 7.9 ng/mL (ref >5.9)

## 2022-05-01 LAB — IRON AND TIBC
Iron: 30 ug/dL (ref 28–170)
Saturation Ratios: 6 % — ABNORMAL LOW (ref 10.4–31.8)
TIBC: 507 ug/dL — ABNORMAL HIGH (ref 250–450)
UIBC: 477 ug/dL

## 2022-05-01 LAB — MAGNESIUM: Magnesium: 2.1 mg/dL (ref 1.7–2.4)

## 2022-05-01 LAB — FERRITIN: Ferritin: 9 ng/mL — ABNORMAL LOW (ref 11–307)

## 2022-05-01 NOTE — Progress Notes (Signed)
05/01/2022  April [name] DOB: Jun 13, 1970 MRN: [id number]   RIDER WAIVER AND RELEASE OF LIABILITY  For the purposes of helping with transportation needs, [facility] partners with outside transportation providers (taxi companies, [place], Social research officer, government.) to give [employer] patients or other approved people the choice of on-demand rides [employer]") to our buildings for non-emergency visits.  By using [employer], I, the person signing this document, on behalf of myself and/or any legal minors (in my care using the [employer]), agree:  Government social research officer given to me are supplied by independent, outside transportation providers who do not work for, or have any affiliation with, [employer]. [place] is not a transportation company. [place] has no control over the quality or safety of the rides I get using [employer]. [place] has no control over whether any outside ride will happen on time or not. [place] gives no guarantee on the reliability, quality, safety, or availability on any rides, or that no mistakes will happen. I know and accept that traveling by vehicle (car, truck, SVU, Lucianne Lei, bus, taxi, etc.) has risks of serious injuries such as disability, being paralyzed, and death. I know and agree the risk of using [employer] is mine alone, and not [employer]. Transport Services are provided "as is" and as are available. The transportation providers are in charge for all inspections and care of the vehicles used to provide these rides. I agree not to take legal action against [place], its agents, employees, officers, directors, representatives, insurers, attorneys, assigns, successors, subsidiaries, and affiliates at any time for any reasons related directly or indirectly to using [employer]. I also agree not to take legal action against [place] or its affiliates for any injury, death, or damage to property caused by or related to using  [employer]. I have read this Waiver and Release of Liability, and I understand the terms used in it and their legal meaning. This Waiver is freely and voluntarily given with the understanding that my right (or any legal minors) to legal action against [place] relating to [employer] is knowingly given up to use these services.   I attest that I read the Ride Waiver and Release of Liability to April [name], gave [name] the opportunity to ask questions and answered the questions asked (if any). I affirm that April [name] then provided consent for assistance with transportation.

## 2022-05-01 NOTE — Telephone Encounter (Signed)
Copied from [place] [phone]. Topic: General - Inquiry >> May 01, 2022 11:03 AM Erskine Squibb wrote: Reason for CRM: The patient called in stating she is currently admitted at [facility] for Ast[facility] Anxiety and will be discharged tomorrow. She has already been scheduled for a hfu appt on 01/15 with her provider. She wanted her provider and Opal Sidles to know this. Please assist patient further.

## 2022-05-01 NOTE — Progress Notes (Signed)
Mobility Specialist Progress Note    05/01/22 1348  Mobility  Activity Ambulated independently in hallway  Level of Assistance Standby assist, set-up cues, supervision of patient - no hands on  Assistive Device None  Distance Ambulated (ft) 220 ft  Activity Response Tolerated well  Mobility Referral Yes  $Mobility charge 1 Mobility   Pre-Mobility: 85 HR, 100% SpO2 During Mobility: >/=95% SpO2 Post-Mobility: 76 HR, 100% SpO2  Pt received coming out of BR and agreeable. No complaints. Tolerated on RA. Returned to sitting EOB with call bell in reach.   Hildred Alamin Mobility Specialist  Please Psychologist, sport and exercise or Rehab Office at [phone]

## 2022-05-01 NOTE — Progress Notes (Signed)
[facility]  PROGRESS NOTE  April [name] FKC:[id number] DOB: 1970/11/19 DOA: 04/28/2022 PCP: [name], MD   Brief HPI:   52 year old female with history of severe asthma, COPD, hypertension, chronic diastolic heart failure, paroxysmal atrial fibrillation, anxiety, morbid obesity, OSA, chronic pain syndrome who is followed by pulmonology as outpatient and was on biologic injections; tezepelumab.  She stopped taking these injections, currently has urgent appeal to insurance company for prior authorization to restart these injections. Patient was admitted with acute hypoxemic respiratory failure, RSV screen was negative, COVID-19, influenza were negative. Pulmonology was consulted, patient started on Pulmicort, Brovana, Yupelri as well as IV steroids     Subjective   Patient seen and examined, still complains of shortness of breath.   Assessment/Plan:   Acute on chronic hypoxemic respiratory failure -Secondary to asthma and COPD exacerbation -Continue DuoNebs, Solu-Medrol -Started on Yupelri, Brovana and Pulmicort; PCCM following -Procalcitonin less than 0.10, patient started on doxycycline -Pulmonology following in the hospital  Leukocytosis -Chronic; WBC elevated at 14.8  Hypertension -Continue amlodipine 10 mg daily, clonidine 0.2 mg twice daily, Coreg 6.25 mg twice daily -Blood pressure is well-controlled  Paroxysmal atrial fibrillation -On chronic anticoagulation -Continue Coreg, apixaban  Chronic diastolic heart failure -Appears well compensated -Continue furosemide 20 mg p.o. daily  Hypothyroidism -Continue levothyroxine  Leukocytosis -WBC elevated at 15.0 -Secondary to steroids  Anxiety/depression -Continue fluoxetine 20 mg p.o. daily -Continue clonazepam as needed  OSA/obesity hypoventilation syndrome -Continue CPAP at bedtime  Metabolic acidosis -Mild, unclear etiology -Follow BMP in am  Morbid obesity -BMI 51.6 kg/m -Patient is on  Ozempic and phentermine as outpatient    Medications     amLODipine  10 mg Oral Daily   apixaban  5 mg Oral BID   arformoterol  15 mcg Nebulization BID   budesonide (PULMICORT) nebulizer solution  0.5 mg Nebulization BID   carvedilol  6.25 mg Oral BID WC   cloNIDine  0.2 mg Oral BID   doxycycline  100 mg Oral Q12H   famotidine  40 mg Oral Daily   FLUoxetine  20 mg Oral Daily   furosemide  20 mg Oral Daily   guaiFENesin  1,200 mg Oral BID   levothyroxine  50 mcg Oral Daily   methylPREDNISolone (SOLU-MEDROL) injection  60 mg Intravenous Q12H   montelukast  10 mg Oral QHS   pregabalin  300 mg Oral TID   revefenacin  175 mcg Nebulization Daily   sodium bicarbonate  650 mg Oral BID   topiramate  200 mg Oral QHS   zolpidem  10 mg Oral QHS     Data Reviewed:   CBG:  No results for input(s): "GLUCAP" in the last 168 hours.  SpO2: 100 % O2 Flow Rate (L/min): 4 L/min    Vitals:   04/30/22 2121 04/30/22 2313 05/01/22 0339 05/01/22 0728  BP:  134/75 124/68 (!) 115/59  Pulse: 67 63 60 63  Resp: '17 16 18 19  '$ Temp:  97.6 F (36.4 C) 98.1 F (36.7 C) 98.1 F (36.7 C)  TempSrc:  Oral Oral Oral  SpO2: 100% 100% 100% 100%  Weight: (!) 145 kg     Height: '5\' 6"'$  (1.676 m)         Data Reviewed:  Basic Metabolic Panel: Recent Labs  Lab 04/28/22 0810 04/28/22 1044 04/29/22 0603 04/30/22 0025 05/01/22 0015  NA 136 139 137 133* 135  K 3.7 3.6 3.7 3.9 3.7  CL 110  --  110 106 108  CO2  22  --  17* 18* 19*  GLUCOSE 113*  --  108* 152* 152*  BUN 6  --  14 21* 24*  CREATININE 0.73  --  0.89 0.91 0.93  CALCIUM 8.7*  --  8.4* 8.5* 8.5*  MG  --   --   --  2.2 2.1  PHOS  --   --   --  2.5 2.2*    CBC: Recent Labs  Lab 04/28/22 0810 04/28/22 1044 04/29/22 0603 04/30/22 0025 05/01/22 0015  WBC 14.8*  --  14.2* 12.8* 15.0*  NEUTROABS 6.5  --   --  10.4* 12.0*  HGB 10.8* 11.9* 10.2* 10.8* 11.1*  HCT 37.1 35.0* 34.2* 35.2* 36.6  MCV 88.5  --  85.7 84.6 84.7  PLT 350   --  352 383 393    LFT Recent Labs  Lab 04/30/22 0025 05/01/22 0015  AST 18 24  ALT 14 18  ALKPHOS 62 63  BILITOT 0.1* 0.3  PROT 7.1 6.8  ALBUMIN 3.1* 3.1*     Antibiotics: Anti-infectives (From admission, onward)    Start     Dose/Rate Route Frequency Ordered Stop   04/30/22 1115  doxycycline (VIBRA-TABS) tablet 100 mg        100 mg Oral Every 12 hours 04/30/22 1029 05/05/22 0959   04/28/22 1800  cefTRIAXone (ROCEPHIN) 2 g in sodium chloride 0.9 % 100 mL IVPB  Status:  Discontinued        2 g 200 mL/hr over 30 Minutes Intravenous Every 24 hours 04/28/22 1759 04/30/22 1029        DVT prophylaxis: Eliquis  Code Status: Full code  Family Communication: No family at bedside   CONSULTS PCCM   Objective    Physical Examination:   General: Appears in no acute distress Cardiovascular: S1-S2, regular Respiratory: Bilateral rhonchi auscultated Abdomen: Soft, nontender, no organomegaly Extremities: No edema in the lower extremities Neurologic: No focal deficit noted   Status is: Inpatient: Acute hypoxemic respiratory failure           [facility] If 7PM-7AM, please contact night-coverage at [email or web address], Office  [phone]   05/01/2022, 9:01 AM  LOS: 3 days

## 2022-05-01 NOTE — Telephone Encounter (Signed)
I spoke to the patient and she explained that she thinks her apartment is making her sick.  She is anxious to move into her own place and has been in contact with [facility]/ TCL program. She understands that she needs to be patient as they try to find the appropriate apartment for her,

## 2022-05-01 NOTE — TOC Progression Note (Addendum)
Transition of Care [facility]) - Progression Note    Patient Details  Name: April [name] MRN: [id number] Date of Birth: 07-13-1970  [facility]) CM/SW Contact  [name], RN Phone Number: 05/01/2022, 1:34 PM  Clinical Narrative:    From home, presents with asthma and copd.  NCM assisting with cab voucher at dc to address of [place] [address]. Jane with Transitions of Care also scheduling patient follow up apt at [facility].  TOC following.        Expected Discharge Plan and Services                                               Social Determinants of Health (SDOH) Interventions SDOH Screenings   Food Insecurity: No Food Insecurity (04/09/2022)  Housing: Medium Risk (04/30/2022)  Transportation Needs: No Transportation Needs (04/09/2022)  Recent Concern: Transportation Needs - Unmet Transportation Needs (04/04/2022)  Utilities: Not At Risk (04/09/2022)  Alcohol Screen: Low Risk  (01/30/2022)  Depression (PHQ2-9): High Risk (03/11/2022)  Financial Resource Strain: High Risk (01/30/2022)  Physical Activity: Insufficiently Active (01/30/2022)  Social Connections: Socially Isolated (01/30/2022)  Stress: Stress Concern Present (01/30/2022)  Tobacco Use: Medium Risk (04/28/2022)    Readmission Risk Interventions    01/10/2022   11:28 AM 10/24/2021   12:29 PM 10/16/2021   11:39 AM  Readmission Risk Prevention Plan  Transportation Screening Complete Complete Complete  Medication Review Press photographer) Complete Complete Complete  PCP or Specialist appointment within 3-5 days of discharge Complete Complete Complete  HRI or Home Care Consult Complete Complete Complete  SW Recovery Care/Counseling Consult Complete Complete   Palliative Care Screening Not Applicable Not [place] Not Applicable Not Applicable

## 2022-05-01 NOTE — Progress Notes (Addendum)
Physical Therapy Treatment Patient Details Name: April [name] MRN: [id number] DOB: [birth year] Today's Date: 05/01/2022   History of Present Illness April [name] is a 52 y.o. female with medical history significant of  severe asthma/COPD, HTN, chronic diastolic CHF, PAF, anxiety/depression, morbid obesity, OSA, chronic pain, and RA presents with complaints of worsening shortness of breath over the last week, admitted for COPD exacerbation.    PT Comments    Pt making good progress towards her physical therapy goals. Ambulating 400 ft with no assistive device or physical assist. Negotiated 3 steps with a L railing to simulate home set up. SpO2 95-98% on RA, HR 73-91 bpm. Pt displays decreased cardiopulmonary endurance. Education provided regarding generalized walking program.   SATURATION QUALIFICATIONS: (This note is used to comply with regulatory documentation for home oxygen)  Patient Saturations on Room Air at Rest = 95%  Patient Saturations on Room Air while Ambulating = 98%  Patient Saturations on 0 Liters of oxygen while Ambulating = N/A  Please briefly explain why patient needs home oxygen: Pt does not require home oxygen    Recommendations for follow up therapy are one component of a multi-disciplinary discharge planning process, led by the attending physician.  Recommendations may be updated based on patient status, additional functional criteria and insurance authorization.  Follow Up Recommendations  No PT follow up     Assistance Recommended at Discharge PRN  Patient can return home with the following Help with stairs or ramp for entrance;Assistance with cooking/housework   Equipment Recommendations  None recommended by PT    Recommendations for Other Services       Precautions / Restrictions Precautions Precautions: Fall Restrictions Weight Bearing Restrictions: No     Mobility  Bed Mobility               General bed mobility comments: Sitting EOB upon  arrival    Transfers Overall transfer level: Modified independent                      Ambulation/Gait Ambulation/Gait assistance: Supervision Gait Distance (Feet): 400 Feet Assistive device: None Gait Pattern/deviations: Step-through pattern, Decreased stride length, Wide base of support       General Gait Details: increased lateral sway with ambulation   Stairs Stairs: Yes Stairs assistance: Supervision Stair Management: One rail Left, Sideways Number of Stairs: 3 General stair comments: Pt negotiated sideways with bilateral hand support, step by step pattern   Wheelchair Mobility    Modified Rankin (Stroke Patients Only)       Balance Overall balance assessment: Mild deficits observed, not formally tested                                          Cognition Arousal/Alertness: Awake/alert Behavior During Therapy: WFL for tasks assessed/performed Overall Cognitive Status: Within Functional Limits for tasks assessed                                          Exercises      General Comments        Pertinent Vitals/Pain Pain Assessment Pain Assessment: Faces Faces Pain Scale: No hurt    Home Living  Prior Function            PT Goals (current goals can now be found in the care plan section) Acute Rehab PT Goals Patient Stated Goal: to return to independent Potential to Achieve Goals: Good Progress towards PT goals: Progressing toward goals    Frequency    Min 3X/week      PT Plan Current plan remains appropriate    Co-evaluation              AM-PAC PT "6 Clicks" Mobility   Outcome Measure  Help needed turning from your back to your side while in a flat bed without using bedrails?: None Help needed moving from lying on your back to sitting on the side of a flat bed without using bedrails?: None Help needed moving to and from a bed to a chair (including a  wheelchair)?: None Help needed standing up from a chair using your arms (e.g., wheelchair or bedside chair)?: None Help needed to walk in hospital room?: None Help needed climbing 3-5 steps with a railing? : A Little 6 Click Score: 23    End of Session   Activity Tolerance: Patient tolerated treatment well Patient left: in bed;with call bell/phone within reach Nurse Communication: Mobility status PT Visit Diagnosis: Difficulty in walking, not elsewhere classified (R26.2)     Time: 2761-4709 PT Time Calculation (min) (ACUTE ONLY): 15 min  Charges:  $Therapeutic Activity: 8-22 mins                     April [name], PT, DPT Acute Rehabilitation Services Office [phone]    April [name] 05/01/2022, 9:09 AM

## 2022-05-01 NOTE — Telephone Encounter (Signed)
HFU with Beth on 1/

## 2022-05-02 ENCOUNTER — Other Ambulatory Visit (HOSPITAL_COMMUNITY): Payer: Self-pay

## 2022-05-02 ENCOUNTER — Ambulatory Visit (HOSPITAL_COMMUNITY): Payer: Medicare Other | Admitting: Psychiatry

## 2022-05-02 DIAGNOSIS — I480 Paroxysmal atrial fibrillation: Secondary | ICD-10-CM | POA: Diagnosis not present

## 2022-05-02 DIAGNOSIS — D72829 Elevated white blood cell count, unspecified: Secondary | ICD-10-CM | POA: Diagnosis not present

## 2022-05-02 DIAGNOSIS — J441 Chronic obstructive pulmonary disease with (acute) exacerbation: Secondary | ICD-10-CM | POA: Diagnosis not present

## 2022-05-02 DIAGNOSIS — I10 Essential (primary) hypertension: Secondary | ICD-10-CM | POA: Diagnosis not present

## 2022-05-02 MED ORDER — PREDNISONE 10 MG PO TABS: ORAL_TABLET | ORAL | 2 refills | Status: DC

## 2022-05-02 MED ORDER — GUAIFENESIN ER 600 MG PO TB12: 1200.0000 mg | ORAL_TABLET | Freq: Two times a day (BID) | ORAL | 0 refills | Status: AC

## 2022-05-02 MED ORDER — DOXYCYCLINE HYCLATE 100 MG PO TABS: 100.0000 mg | ORAL_TABLET | Freq: Two times a day (BID) | ORAL | 0 refills | Status: AC

## 2022-05-02 MED ORDER — MONTELUKAST SODIUM 10 MG PO TABS: 10.0000 mg | ORAL_TABLET | Freq: Every day | ORAL | 2 refills | Status: DC

## 2022-05-02 NOTE — Telephone Encounter (Signed)
Called [employer] for [place] appeal determination. Per rep, denial has been OVERTURNED. April [name] is approved from 04/29/2022 through 04/29/2023. Rep will fax determination letter.  Ref # [id number]  Phone: [phone]  [name], PharmD, MPH, BCPS, CPP Clinical Pharmacist (Rheumatology and Pulmonology)

## 2022-05-02 NOTE — TOC Transition Note (Signed)
Transition of Care [facility]) - CM/SW Discharge Note   Patient Details  Name: April [name] MRN: [id number] Date of Birth: 1970/10/15  [facility]) CM/SW Contact:  [name], RN Phone Number: 05/02/2022, 10:23 AM   Clinical Narrative:    Patient is for dc today, NCM gave her cab voucher yesterday, she has no other needs.          Patient Goals and CMS Choice      Discharge Placement                         Discharge Plan and Services Additional resources added to the After Visit Summary for                                       Social Determinants of Health (SDOH) Interventions SDOH Screenings   Food Insecurity: No Food Insecurity (04/09/2022)  Housing: Medium Risk (04/30/2022)  Transportation Needs: No Transportation Needs (04/09/2022)  Recent Concern: Transportation Needs - Unmet Transportation Needs (04/04/2022)  Utilities: Not At Risk (04/09/2022)  Alcohol Screen: Low Risk  (01/30/2022)  Depression (PHQ2-9): High Risk (03/11/2022)  Financial Resource Strain: High Risk (01/30/2022)  Physical Activity: Insufficiently Active (01/30/2022)  Social Connections: Socially Isolated (01/30/2022)  Stress: Stress Concern Present (01/30/2022)  Tobacco Use: Medium Risk (04/28/2022)     Readmission Risk Interventions    01/10/2022   11:28 AM 10/24/2021   12:29 PM 10/16/2021   11:39 AM  Readmission Risk Prevention Plan  Transportation Screening Complete Complete Complete  Medication Review Press photographer) Complete Complete Complete  PCP or Specialist appointment within 3-5 days of discharge Complete Complete Complete  HRI or Home Care Consult Complete Complete Complete  SW Recovery Care/Counseling Consult Complete Complete   Palliative Care Screening Not Applicable Not [place] Not Applicable Not Applicable

## 2022-05-02 NOTE — Care Management Important Message (Signed)
Important Message  Patient Details  Name: April [name] MRN: [id number] Date of Birth: 1970-09-16   Medicare Important Message Given:  Yes   Patient left prior to IM delivery will mail copy to home address.     Ndidi Nesby 05/02/2022, 3:28 PM

## 2022-05-02 NOTE — Progress Notes (Signed)
Mobility Specialist Progress Note    05/02/22 1019  Mobility  Activity Ambulated independently in hallway  Level of Assistance Standby assist, set-up cues, supervision of patient - no hands on  Assistive Device None  Distance Ambulated (ft) 220 ft  Activity Response Tolerated well  $Mobility charge 1 Mobility   Pt received sitting EOB and agreeable. No complaints on walk. Returned to room to wash up.  Hildred Alamin Mobility Specialist  Please Psychologist, sport and exercise or Rehab Office at [phone]

## 2022-05-02 NOTE — Discharge Summary (Signed)
Physician Discharge Summary   Patient: April [name] MRN: [id number] DOB: 03/04/1971  Admit date:     04/28/2022  Discharge date: 05/02/22  Discharge Physician: Oswald Hillock   PCP: [name], MD   Recommendations at discharge:   Follow-up PCP in 1 week Follow-up pulmonology in 2 weeks  Discharge Diagnoses: Principal Problem:   Asthma with COPD with exacerbation ([place]) Active Problems:   Leukocytosis   Hypochromic anemia   HTN (hypertension)   AF (paroxysmal atrial fibrillation) (HCC)   Chronic heart failure with preserved ejection fraction (HFpEF) (HCC)   Hypothyroidism   Anxiety and depression   Obstructive sleep apnea   Morbid obesity due to excess calories ([name])  Resolved Problems:   * No resolved hospital problems. *  Hospital Course:  52 year old female with history of severe asthma, COPD, hypertension, chronic diastolic heart failure, paroxysmal atrial fibrillation, anxiety, morbid obesity, OSA, chronic pain syndrome who is followed by pulmonology as outpatient and was on biologic injections; tezepelumab.  She stopped taking these injections, currently has urgent appeal to insurance company for prior authorization to restart these injections. Patient was admitted with acute hypoxemic respiratory failure, RSV screen was negative, COVID-19, influenza were negative. Pulmonology was consulted, patient started on Pulmicort, Brovana, Yupelri as well as IV steroids  Assessment and Plan:  Acute on chronic hypoxemic respiratory failure -Secondary to asthma and COPD exacerbation -Patient was started on DuoNebs, Solu-Medrol -Started on Yupelri, Brovana and Pulmicort; PCCM was consulted in the hospital -Procalcitonin less than 0.10, patient started on doxycycline -PCCM recommended to discharge on prednisone 40 mg daily for 1 week and then prednisone 20 mg daily till seen by pulmonology clinic -Continue nebulizer treatments as prescribed as outpatient -Ambulatory pulse ox  testing done, patient O2 sats 98% on room air while ambulating.  Does not require oxygen at home.   Leukocytosis -Chronic; WBC elevated at 14.8   Hypertension -Continue amlodipine 10 mg daily, clonidine 0.2 mg twice daily, Coreg 6.25 mg twice daily -Blood pressure is well-controlled  Paroxysmal atrial fibrillation -On chronic anticoagulation -Continue Coreg, apixaban   Chronic diastolic heart failure -Appears well compensated -Continue furosemide 20 mg p.o. daily   Hypothyroidism -Continue levothyroxine   Leukocytosis -WBC elevated at 15.0 -Secondary to steroids   Anxiety/depression -Continue fluoxetine 20 mg p.o. daily -Continue clonazepam as needed   OSA/obesity hypoventilation syndrome -Continue CPAP at bedtime   Metabolic acidosis -Mild, unclear etiology -Improved after starting sodium bicarb tablets -Follow-up BMP in 1 week   Morbid obesity -BMI 51.6 kg/m -Patient is on Ozempic and phentermine as outpatient      Consultants: Pulmonology Procedures performed: None Disposition: Home Diet recommendation:  Discharge Diet Orders (From admission, onward)     Start     Ordered   05/02/22 0000  Diet - low sodium heart healthy        05/02/22 0918           Regular diet DISCHARGE MEDICATION: Allergies as of 05/02/2022       Reactions   Contrast Media [iodinated Contrast Media] Itching   CT contrast. Unknown per PT.         Medication List     TAKE these medications    albuterol 108 (90 Base) MCG/ACT inhaler Commonly known as: ProAir HFA Inhale 2 puffs into the lungs every 6 (six) hours as needed for wheezing or shortness of breath. What changed: when to take this   amLODipine 10 MG tablet Commonly known as: NORVASC Take 1 tablet (  10 mg total) by mouth daily.   apixaban 5 MG Tabs tablet Commonly known as: ELIQUIS Take 1 tablet (5 mg total) by mouth 2 (two) times daily.   baclofen 20 MG tablet Commonly known as: LIORESAL Take 20-40 mg  by mouth 3 (three) times daily as needed for muscle spasms.   carvedilol 6.25 MG tablet Commonly known as: COREG TAKE 1 TABLET(6.25 MG) BY MOUTH TWICE DAILY WITH A MEAL What changed: See the new instructions.   clonazePAM 0.5 MG tablet Commonly known as: KlonoPIN Take 1 tablet (0.5 mg total) by mouth daily as needed for anxiety. What changed: when to take this   cloNIDine 0.2 MG tablet Commonly known as: CATAPRES Take 1 tablet (0.2 mg total) by mouth 2 (two) times daily.   doxycycline 100 MG tablet Commonly known as: VIBRA-TABS Take 1 tablet (100 mg total) by mouth every 12 (twelve) hours for 3 days.   EPINEPHrine 0.3 mg/0.3 mL Soaj injection Commonly known as: EPI-PEN Inject 0.3 mg into the muscle as needed for anaphylaxis.   famotidine 40 MG tablet Commonly known as: PEPCID Take 40 mg by mouth daily.   FLUoxetine 20 MG capsule Commonly known as: PROZAC Take 1 capsule (20 mg total) by mouth daily. Take 1 capsule daily.   fluticasone 50 MCG/ACT nasal spray Commonly known as: FLONASE SHAKE LIQUID AND USE 1 SPRAY IN EACH NOSTRIL DAILY AS NEEDED FOR ALLERGIES OR RHINITIS What changed: See the new instructions.   furosemide 20 MG tablet Commonly known as: LASIX TAKE 1 TABLET(20 MG) BY MOUTH DAILY What changed: See the new instructions.   guaiFENesin 600 MG 12 hr tablet Commonly known as: MUCINEX Take 2 tablets (1,200 mg total) by mouth 2 (two) times daily for 5 days.   ipratropium-albuterol 0.5-2.5 (3) MG/3ML Soln Commonly known as: DUONEB 1 neb every 6 hours if needed for asthma What changed:  how much to take how to take this when to take this reasons to take this additional instructions   levothyroxine 50 MCG tablet Commonly known as: SYNTHROID Take 1 tablet (50 mcg total) by mouth daily.   montelukast 10 MG tablet Commonly known as: SINGULAIR Take 1 tablet (10 mg total) by mouth at bedtime.   naloxone 4 MG/0.1ML Liqd nasal spray kit Commonly known as:  NARCAN Place 1 spray into the nose once as needed (opioid overdose).   oxyCODONE-acetaminophen 10-325 MG tablet Commonly known as: PERCOCET Take 1 tablet by mouth 5 (five) times daily as needed for pain.   Ozempic (1 MG/DOSE) 4 MG/3ML Sopn Generic drug: Semaglutide (1 MG/DOSE) Inject 1 mg into the skin once a week.   phentermine 15 MG capsule Take 15 mg by mouth daily.   potassium chloride SA 20 MEQ tablet Commonly known as: KLOR-CON M TAKE 1 TABLET(20 MEQ) BY MOUTH DAILY What changed: See the new instructions.   predniSONE 10 MG tablet Commonly known as: DELTASONE Take Prednisone 40 mg po daily x 1 week, then Prednisone 20 mg po daily till seen by Pulmonology clinic What changed: additional instructions   pregabalin 300 MG capsule Commonly known as: LYRICA Take 300 mg by mouth in the morning, at noon, and at bedtime.   Tezspire 210 MG/1.91ML Soaj Generic drug: Tezepelumab-ekko Inject 210 mg into the skin every 28 (twenty-eight) days.   topiramate 200 MG tablet Commonly known as: TOPAMAX Take 200 mg by mouth at bedtime.   Trelegy Ellipta 100-62.5-25 MCG/ACT Aepb Generic drug: Fluticasone-Umeclidin-Vilant Inhale 1 puff into the lungs daily.  Vitamin D (Ergocalciferol) 1.25 MG (50000 UNIT) Caps capsule Commonly known as: DRISDOL Take 50,000 Units by mouth once a week.   zolpidem 10 MG tablet Commonly known as: AMBIEN TAKE 1 TABLET BY MOUTH AT BEDTIME AS NEEDED FOR SLEEP What changed:  how much to take how to take this when to take this additional instructions        Follow-up Information     Candee Furbish, MD Follow up in 2 week(s).   Specialty: Pulmonary Disease Contact information: [place] [address] [phone]                Discharge Exam: Danley Danker Weights   04/30/22 2121  Weight: (!) 145 kg   General-appears in no acute distress Heart-S1-S2, regular, no murmur auscultated Lungs-clear to auscultation bilaterally,  no wheezing or crackles auscultated Abdomen-soft, nontender, no organomegaly Extremities-no edema in the lower extremities Neuro-alert, oriented x3, no focal deficit noted  Condition at discharge: good  The results of significant diagnostics from this hospitalization (including imaging, microbiology, ancillary and laboratory) are listed below for reference.   Imaging Studies: DG CHEST PORT 1 VIEW  Result Date: 05/01/2022 CLINICAL DATA:  Shortness of breath EXAM: PORTABLE CHEST 1 VIEW COMPARISON:  Radiograph 04/28/2022 FINDINGS: Unchanged cardiomediastinal silhouette. There is haziness of the lower lungs favored to be due to overlying soft tissue. There is no definite new airspace disease. No pleural effusion or evidence of pneumothorax. There is no acute osseous abnormality. IMPRESSION: No evidence of acute cardiopulmonary disease. Electronically Signed   By: Maurine Simmering M.D.   On: 05/01/2022 08:15   DG Chest Port 1 View  Result Date: 04/28/2022 CLINICAL DATA:  52 year old female with history of shortness of breath. EXAM: PORTABLE CHEST 1 VIEW COMPARISON:  Chest x-ray 04/09/2022. FINDINGS: Lung volumes are low. No consolidative airspace disease. No pleural effusions. No pneumothorax. No pulmonary nodule or mass noted. Pulmonary vasculature and the cardiomediastinal silhouette are within normal limits. IMPRESSION: 1. Low lung volumes without radiographic evidence of acute cardiopulmonary disease. Electronically Signed   By: Vinnie Langton M.D.   On: 04/28/2022 08:40   DG Chest 2 View  Result Date: 04/09/2022 CLINICAL DATA:  52 year old female with history of wheezing, cough and shortness of breath for the past 2 weeks. EXAM: CHEST - 2 VIEW COMPARISON:  Chest x-ray 04/04/2022. FINDINGS: Lung volumes are low. No consolidative airspace disease. No pleural effusions. No pneumothorax. No pulmonary nodule or mass noted. Pulmonary vasculature and the cardiomediastinal silhouette are within normal  limits. IMPRESSION: 1. Low lung volumes without radiographic evidence of acute cardiopulmonary disease. Electronically Signed   By: Vinnie Langton M.D.   On: 04/09/2022 07:39   DG Chest Port 1 View  Result Date: 04/04/2022 CLINICAL DATA:  Shortness of breath for 4 days. EXAM: PORTABLE CHEST 1 VIEW COMPARISON:  01/07/2022 FINDINGS: 0510 hours. Cardiopericardial silhouette is at upper limits of normal for size. The lungs are clear without focal pneumonia, edema, pneumothorax or pleural effusion. Evaluation of lung parenchyma somewhat limited by technique and overlying soft tissue. Telemetry leads overlie the chest. IMPRESSION: No active disease. Electronically Signed   By: Misty Stanley M.D.   On: 04/04/2022 06:11    Microbiology: Results for orders placed or performed during the hospital encounter of 04/28/22  Resp panel by RT-PCR (RSV, Flu A&B, Covid) Anterior Nasal Swab     Status: None   Collection Time: 04/28/22  8:20 AM   Specimen: Anterior Nasal Swab  Result Value Ref  Range Status   SARS Coronavirus 2 by RT PCR NEGATIVE NEGATIVE Final    Comment: (NOTE) SARS-CoV-2 target nucleic acids are NOT DETECTED.  The SARS-CoV-2 RNA is generally detectable in upper respiratory specimens during the acute phase of infection. The lowest concentration of SARS-CoV-2 viral copies this assay can detect is 138 copies/mL. A negative result does not preclude SARS-Cov-2 infection and should not be used as the sole basis for treatment or other patient management decisions. A negative result may occur with  improper specimen collection/handling, submission of specimen other than nasopharyngeal swab, presence of viral mutation(s) within the areas targeted by this assay, and inadequate number of viral copies(<138 copies/mL). A negative result must be combined with clinical observations, patient history, and epidemiological information. The expected result is Negative.  Fact Sheet for Patients:   EntrepreneurPulse.com.au  Fact Sheet for Healthcare Providers:  IncredibleEmployment.be  This test is no t yet approved or cleared by the [place] FDA and  has been authorized for detection and/or diagnosis of SARS-CoV-2 by FDA under an Emergency Use Authorization (EUA). This EUA will remain  in effect (meaning this test can be used) for the duration of the COVID-19 declaration under Section 564(b)(1) of the Act, 21 U.S.C.section 360bbb-3(b)(1), unless the authorization is terminated  or revoked sooner.       Influenza A by PCR NEGATIVE NEGATIVE Final   Influenza B by PCR NEGATIVE NEGATIVE Final    Comment: (NOTE) The Xpert Xpress SARS-CoV-2/FLU/RSV plus assay is intended as an aid in the diagnosis of influenza from Nasopharyngeal swab specimens and should not be used as a sole basis for treatment. Nasal washings and aspirates are unacceptable for Xpert Xpress SARS-CoV-2/FLU/RSV testing.  Fact Sheet for Patients: EntrepreneurPulse.com.au  Fact Sheet for Healthcare Providers: IncredibleEmployment.be  This test is not yet approved or cleared by the [place] FDA and has been authorized for detection and/or diagnosis of SARS-CoV-2 by FDA under an Emergency Use Authorization (EUA). This EUA will remain in effect (meaning this test can be used) for the duration of the COVID-19 declaration under Section 564(b)(1) of the Act, 21 U.S.C. section 360bbb-3(b)(1), unless the authorization is terminated or revoked.     Resp Syncytial Virus by PCR NEGATIVE NEGATIVE Final    Comment: (NOTE) Fact Sheet for Patients: EntrepreneurPulse.com.au  Fact Sheet for Healthcare Providers: IncredibleEmployment.be  This test is not yet approved or cleared by the [place] FDA and has been authorized for detection and/or diagnosis of SARS-CoV-2 by FDA under an Emergency Use  Authorization (EUA). This EUA will remain in effect (meaning this test can be used) for the duration of the COVID-19 declaration under Section 564(b)(1) of the Act, 21 U.S.C. section 360bbb-3(b)(1), unless the authorization is terminated or revoked.  Performed at [facility], [place] [address] [place], [place] [address]   Respiratory (~20 pathogens) panel by PCR     Status: None   Collection Time: 04/28/22  8:20 AM   Specimen: Nasopharyngeal Swab; Respiratory  Result Value Ref Range Status   Adenovirus NOT DETECTED NOT DETECTED Final   Coronavirus 229E NOT DETECTED NOT DETECTED Final    Comment: (NOTE) The Coronavirus on the Respiratory Panel, DOES NOT test for the novel  Coronavirus (2019 nCoV)    Coronavirus HKU1 NOT DETECTED NOT DETECTED Final   Coronavirus NL63 NOT DETECTED NOT DETECTED Final   Coronavirus OC43 NOT DETECTED NOT DETECTED Final   Metapneumovirus NOT DETECTED NOT DETECTED Final   Rhinovirus / Enterovirus NOT DETECTED NOT  DETECTED Final   Influenza A NOT DETECTED NOT DETECTED Final   Influenza B NOT DETECTED NOT DETECTED Final   Parainfluenza Virus 1 NOT DETECTED NOT DETECTED Final   Parainfluenza Virus 2 NOT DETECTED NOT DETECTED Final   Parainfluenza Virus 3 NOT DETECTED NOT DETECTED Final   Parainfluenza Virus 4 NOT DETECTED NOT DETECTED Final   Respiratory Syncytial Virus NOT DETECTED NOT DETECTED Final   Bordetella pertussis NOT DETECTED NOT DETECTED Final   Bordetella Parapertussis NOT DETECTED NOT DETECTED Final   Chlamydophila pneumoniae NOT DETECTED NOT DETECTED Final   Mycoplasma pneumoniae NOT DETECTED NOT DETECTED Final    Comment: Performed at [facility], [place] [address], [place], [place] [address]  MRSA Next Gen by PCR, Nasal     Status: None   Collection Time: 04/29/22  2:35 PM   Specimen: Nasal Mucosa; Nasal Swab  Result Value Ref Range Status   MRSA by PCR Next Gen NOT DETECTED NOT DETECTED Final    Comment: (NOTE) The GeneXpert  MRSA Assay (FDA approved for NASAL specimens only), is one component of a comprehensive MRSA colonization surveillance program. It is not intended to diagnose MRSA infection nor to guide or monitor treatment for MRSA infections. Test performance is not FDA approved in patients less than 21 years old. Performed at [facility], [place] [address], [place],  [address]    *Note: Due to a large number of results and/or encounters for the requested time period, some results have not been displayed. A complete set of results can be found in Results Review.    Labs: CBC: Recent Labs  Lab 04/28/22 0810 04/28/22 1044 04/29/22 0603 04/30/22 0025 05/01/22 0015  WBC 14.8*  --  14.2* 12.8* 15.0*  NEUTROABS 6.5  --   --  10.4* 12.0*  HGB 10.8* 11.9* 10.2* 10.8* 11.1*  HCT 37.1 35.0* 34.2* 35.2* 36.6  MCV 88.5  --  85.7 84.6 84.7  PLT 350  --  352 383 220   Basic Metabolic Panel: Recent Labs  Lab 04/28/22 0810 04/28/22 1044 04/29/22 0603 04/30/22 0025 05/01/22 0015  NA 136 139 137 133* 135  K 3.7 3.6 3.7 3.9 3.7  CL 110  --  110 106 108  CO2 22  --  17* 18* 19*  GLUCOSE 113*  --  108* 152* 152*  BUN 6  --  14 21* 24*  CREATININE 0.73  --  0.89 0.91 0.93  CALCIUM 8.7*  --  8.4* 8.5* 8.5*  MG  --   --   --  2.2 2.1  PHOS  --   --   --  2.5 2.2*   Liver Function Tests: Recent Labs  Lab 04/30/22 0025 05/01/22 0015  AST 18 24  ALT 14 18  ALKPHOS 62 63  BILITOT 0.1* 0.3  PROT 7.1 6.8  ALBUMIN 3.1* 3.1*   CBG: No results for input(s): "GLUCAP" in the last 168 hours.  Discharge time spent: greater than 30 minutes.  Signed: Oswald Hillock, MD [facility] 05/02/2022

## 2022-05-06 ENCOUNTER — Telehealth: Payer: Self-pay

## 2022-05-06 NOTE — Telephone Encounter (Signed)
From the discharge call:  She said she is doing okay. No questions/ concerns at this time.   She said she has all of her medications as well as a working nebulizer. Has home O2 but only uses it when needed.   She continues to work with the Transition to [employer]) program and hopes to be able to move into her own apartment in the near future.  The program will subsidize her rent.    Scheduled to see Dr Margarita Rana - 05/13/2022 @ 0950.

## 2022-05-06 NOTE — Telephone Encounter (Signed)
Transition Care Management Follow-up Telephone Call Date of discharge and from where: 05/02/2022, [facility] How have you been since you were released from the hospital? She is doing okay.  Any questions or concerns? No  Items Reviewed: Did the pt receive and understand the discharge instructions provided? Yes  Medications obtained and verified? Yes - she said she has all of her medications as well as a working nebulizer.  Other? No  Any new allergies since your discharge? No  Dietary orders reviewed? No Do you have support at home? Yes  She continues to work with the [employer] and hopes to be able to move into her own apartment in the near future.  The program will subsidize her rent.   Home Care and Equipment/Supplies: Were home health services ordered? no If so, what is the name of the agency? N/a  Has the agency set up a time to come to the patient's home? not applicable Were any new equipment or medical supplies ordered?  No What is the name of the medical supply agency? N/a Were you able to get the supplies/equipment? not applicable Do you have any questions related to the use of the equipment or supplies? No  Functional Questionnaire: (I = Independent and D = Dependent) ADLs: independent,has home O2 but only uses it when needed    Follow up appointments reviewed:  PCP Hospital f/u appt confirmed? Yes  Scheduled to see Dr Margarita Rana - 05/13/2022 @ 0950. [facility] f/u appt confirmed? Yes  Scheduled to see pulmonary - 05/15/2022.   Are transportation arrangements needed? No  If their condition worsens, is the pt aware to call PCP or go to the Emergency Dept.? Yes Was the patient provided with contact information for the PCP's office or ED? Yes Was to pt encouraged to call back with questions or concerns? Yes

## 2022-05-13 ENCOUNTER — Ambulatory Visit: Payer: Medicare Other | Admitting: Family Medicine

## 2022-05-13 ENCOUNTER — Ambulatory Visit: Payer: Self-pay

## 2022-05-13 ENCOUNTER — Other Ambulatory Visit: Payer: Self-pay | Admitting: *Deleted

## 2022-05-13 MED ORDER — FLUTICASONE-UMECLIDIN-VILANT 100-62.5-25 MCG/ACT IN AEPB: 1.0000 | INHALATION_SPRAY | Freq: Every day | RESPIRATORY_TRACT | 5 refills | Status: DC

## 2022-05-13 NOTE — Telephone Encounter (Signed)
Pt requesting you to reach out.

## 2022-05-13 NOTE — Telephone Encounter (Signed)
Message from Alm Bustard sent at 05/13/2022  1:19 PM EST  Summary: pain in legs and feet   Patient missed her appointment today at 9:50am. Patient is having pain in both legs and both feet, it is a achy pain and she is having to stay in the bed. Per patient she has been taking pregabalin (LYRICA) 300 MG capsule it is helping a little bit but not much. Patient is also taking oxyCODONE-acetaminophen (PERCOCET) 10-325 MG tablet and it is not helping either. (Patient states that she is not trying to get addicted to the medication Oxycodone).  She is wanting to know what else she can do.   Please advise         Chief Complaint: pain/burning legs feet Symptoms: severe pain, swelling to feet and ankles especially when up walking, mild effect form pregabalin and using Percocet for breakthrough  Frequency: chronic  Pertinent Negatives: Patient denies chest pain, back pain, difficulty breathing, swelling, rash, fever, numbness, weakness Disposition: '[]'$ ED /'[]'$ Urgent Care (no appt availability in office) / '[x]'$ Appointment(In office/virtual)/ '[]'$  [place] [facility]/ '[]'$ Home Care/ '[]'$ Refused Recommended Disposition /'[]'$ [place] [facility]/ '[]'$  Follow-up with PCP Additional Notes: pt would like Health and safety inspector to call pt. Unable to schedule appt with Dr. Margarita Rana- no available appts. Missed appt today due to pain. Pt would prefer appt with PCP.  Reason for Disposition  [1] SEVERE pain (e.g., excruciating, unable to do any normal activities) AND [2] not improved after 2 hours of pain medicine  Answer Assessment - Initial Assessment Questions 1. ONSET: "When did the pain start?"      Chronic issue  2. LOCATION: "Where is the pain located?"      Leg and feet pain 3. PAIN: "How bad is the pain?"    (Scale 1-10; or mild, moderate, severe)   -  MILD (1-3): doesn't interfere with normal activities    -  MODERATE (4-7): interferes with normal activities (e.g., work or school) or awakens from sleep, limping    -   SEVERE (8-10): excruciating pain, unable to do any normal activities, unable to walk     Achy- -severe 4. WORK OR EXERCISE: "Has there been any recent work or exercise that involved this part of the body?"      *No Answer* 5. CAUSE: "What do you think is causing the leg pain?"     Unsure  6. OTHER SYMPTOMS: "Do you have any other symptoms?" (e.g., chest pain, back pain, breathing difficulty, swelling, rash, fever, numbness, weakness)     Swelling to feet and ankle - when active and walking, knee pain,  7. PREGNANCY: "Is there any chance you are pregnant?" "When was your last menstrual period?"     N/a  Protocols used: Leg Pain-A-AH

## 2022-05-14 ENCOUNTER — Telehealth: Payer: Self-pay | Admitting: Internal Medicine

## 2022-05-14 MED ORDER — CLONAZEPAM 0.5 MG PO TABS: 0.5000 mg | ORAL_TABLET | Freq: Every day | ORAL | 2 refills | Status: DC | PRN

## 2022-05-14 NOTE — Telephone Encounter (Signed)
Fax received from pt's pharmacy in regards to a refill request of pt's clonazepam 0.'5mg'$ . Dr. Annamaria Boots, please advise on this.  Allergies  Allergen Reactions   Contrast Media [Iodinated Contrast Media] Itching    CT contrast. Unknown per PT.      Current Outpatient Medications:    albuterol (PROAIR HFA) 108 (90 Base) MCG/ACT inhaler, Inhale 2 puffs into the lungs every 6 (six) hours as needed for wheezing or shortness of breath. (Patient taking differently: Inhale 2 puffs into the lungs as needed for wheezing or shortness of breath.), Disp: 8.5 g, Rfl: 11   amLODipine (NORVASC) 10 MG tablet, Take 1 tablet (10 mg total) by mouth daily., Disp: 90 tablet, Rfl: 1   apixaban (ELIQUIS) 5 MG TABS tablet, Take 1 tablet (5 mg total) by mouth 2 (two) times daily., Disp: 180 tablet, Rfl: 1   baclofen (LIORESAL) 20 MG tablet, Take 20-40 mg by mouth 3 (three) times daily as needed for muscle spasms., Disp: , Rfl:    carvedilol (COREG) 6.25 MG tablet, TAKE 1 TABLET(6.25 MG) BY MOUTH TWICE DAILY WITH A MEAL (Patient taking differently: Take 6.25 mg by mouth daily.), Disp: 180 tablet, Rfl: 1   clonazePAM (KLONOPIN) 0.5 MG tablet, Take 1 tablet (0.5 mg total) by mouth daily as needed for anxiety. (Patient taking differently: Take 0.5 mg by mouth 2 (two) times daily as needed for anxiety.), Disp: 10 tablet, Rfl: 0   cloNIDine (CATAPRES) 0.2 MG tablet, Take 1 tablet (0.2 mg total) by mouth 2 (two) times daily., Disp: 60 tablet, Rfl: 3   EPINEPHrine 0.3 mg/0.3 mL IJ SOAJ injection, Inject 0.3 mg into the muscle as needed for anaphylaxis., Disp: 1 each, Rfl: 5   famotidine (PEPCID) 40 MG tablet, Take 40 mg by mouth daily., Disp: , Rfl:    FLUoxetine (PROZAC) 20 MG capsule, Take 1 capsule (20 mg total) by mouth daily. Take 1 capsule daily., Disp: 30 capsule, Rfl: 1   fluticasone (FLONASE) 50 MCG/ACT nasal spray, SHAKE LIQUID AND USE 1 SPRAY IN EACH NOSTRIL DAILY AS NEEDED FOR ALLERGIES OR RHINITIS (Patient taking  differently: Place 1 spray into both nostrils as needed for allergies.), Disp: 16 g, Rfl: 0   Fluticasone-Umeclidin-Vilant 100-62.5-25 MCG/ACT AEPB, Inhale 1 puff into the lungs daily., Disp: 60 each, Rfl: 5   furosemide (LASIX) 20 MG tablet, TAKE 1 TABLET(20 MG) BY MOUTH DAILY (Patient taking differently: Take 20 mg by mouth daily.), Disp: 90 tablet, Rfl: 0   ipratropium-albuterol (DUONEB) 0.5-2.5 (3) MG/3ML SOLN, 1 neb every 6 hours if needed for asthma (Patient taking differently: Take 3 mLs by nebulization every 4 (four) hours as needed (shortness of breath).), Disp: 75 mL, Rfl: 12   levothyroxine (SYNTHROID) 50 MCG tablet, Take 1 tablet (50 mcg total) by mouth daily., Disp: 90 tablet, Rfl: 1   montelukast (SINGULAIR) 10 MG tablet, Take 1 tablet (10 mg total) by mouth at bedtime., Disp: 30 tablet, Rfl: 2   naloxone (NARCAN) nasal spray 4 mg/0.1 mL, Place 1 spray into the nose once as needed (opioid overdose)., Disp: , Rfl:    oxyCODONE-acetaminophen (PERCOCET) 10-325 MG tablet, Take 1 tablet by mouth 5 (five) times daily as needed for pain., Disp: 150 tablet, Rfl: 0   phentermine 15 MG capsule, Take 15 mg by mouth daily., Disp: , Rfl:    potassium chloride SA (KLOR-CON M) 20 MEQ tablet, TAKE 1 TABLET(20 MEQ) BY MOUTH DAILY (Patient taking differently: Take 20 mEq by mouth daily.), Disp: 90 tablet,  Rfl: 3   predniSONE (DELTASONE) 10 MG tablet, Take Prednisone 40 mg po daily x 1 week, then Prednisone 20 mg po daily till seen by Pulmonology clinic, Disp: 50 tablet, Rfl: 2   pregabalin (LYRICA) 300 MG capsule, Take 300 mg by mouth in the morning, at noon, and at bedtime., Disp: , Rfl:    Semaglutide, 1 MG/DOSE, (OZEMPIC, 1 MG/DOSE,) 4 MG/3ML SOPN, Inject 1 mg into the skin once a week., Disp: , Rfl:    Tezepelumab-ekko (TEZSPIRE) 210 MG/1.91ML SOAJ, Inject 210 mg into the skin every 28 (twenty-eight) days., Disp: 1.91 mL, Rfl: 5   topiramate (TOPAMAX) 200 MG tablet, Take 200 mg by mouth at bedtime.,  Disp: , Rfl:    Vitamin D, Ergocalciferol, (DRISDOL) 1.25 MG (50000 UNIT) CAPS capsule, Take 50,000 Units by mouth once a week., Disp: , Rfl:    zolpidem (AMBIEN) 10 MG tablet, TAKE 1 TABLET BY MOUTH AT BEDTIME AS NEEDED FOR SLEEP (Patient taking differently: Take 10 mg by mouth at bedtime.), Disp: 30 tablet, Rfl: 5

## 2022-05-14 NOTE — Telephone Encounter (Signed)
Called and spoke with pt letting her know that [facility] refilled her medication for her and she verbalized understanding. Nothing further needed. 

## 2022-05-14 NOTE — Telephone Encounter (Signed)
Clonazepam refilled 

## 2022-05-15 ENCOUNTER — Telehealth: Payer: Self-pay | Admitting: Primary Care

## 2022-05-15 ENCOUNTER — Ambulatory Visit: Payer: Medicaid Other | Admitting: Physician Assistant

## 2022-05-15 ENCOUNTER — Ambulatory Visit (INDEPENDENT_AMBULATORY_CARE_PROVIDER_SITE_OTHER): Payer: 59 | Admitting: Primary Care

## 2022-05-15 VITALS — BP 130/82 | HR 87 | Ht 65.0 in | Wt 314.5 lb

## 2022-05-15 DIAGNOSIS — J449 Chronic obstructive pulmonary disease, unspecified: Secondary | ICD-10-CM

## 2022-05-15 DIAGNOSIS — G4733 Obstructive sleep apnea (adult) (pediatric): Secondary | ICD-10-CM

## 2022-05-15 DIAGNOSIS — J4489 Other specified chronic obstructive pulmonary disease: Secondary | ICD-10-CM | POA: Diagnosis not present

## 2022-05-15 DIAGNOSIS — J9601 Acute respiratory failure with hypoxia: Secondary | ICD-10-CM

## 2022-05-15 DIAGNOSIS — J4541 Moderate persistent asthma with (acute) exacerbation: Secondary | ICD-10-CM

## 2022-05-15 MED ORDER — METHYLPREDNISOLONE ACETATE 80 MG/ML IJ SUSP
80.0000 mg | Freq: Once | INTRAMUSCULAR | Status: AC
  Administered 2022-05-15: 80 mg via INTRAMUSCULAR

## 2022-05-15 MED ORDER — PREDNISONE 10 MG PO TABS: ORAL_TABLET | ORAL | 2 refills | Status: DC

## 2022-05-15 MED ORDER — TRELEGY ELLIPTA 200-62.5-25 MCG/ACT IN AEPB: 1.0000 | INHALATION_SPRAY | Freq: Every day | RESPIRATORY_TRACT | 5 refills | Status: DC

## 2022-05-15 NOTE — Patient Instructions (Addendum)
Recommendations - Increase Trelegy 233mg one puff daily in the morning  - Use Duoneb every 6 hours for wheezing/shortness of breath - Start prednisone 05/16/22; then stay on '20mg'$  daily until visit with Dr. YAnnamaria Boots - Resume CPAP use nightly once we can get you a new CPAP after sleep study - I have reached out to pharmacy team about getting Tezspire injections in office  Orders: - Split night sleep study   Office treatment: - Depo-medrol '80mg'$  IM x 1  Follow-up: -  MUST keep appointment on February 5th with Dr. YAnnamaria Boots(Please bring ALL medications in with you to visit)

## 2022-05-15 NOTE — Assessment & Plan Note (Signed)
-  Resolved; no oxygen requirements

## 2022-05-15 NOTE — Progress Notes (Signed)
$'@Patient'B$  ID: April [name], female    DOB: [birth year], 52 y.o.   MRN: [id number]  Chief Complaint  Patient presents with   Hospitalization Follow-up    Breathing has ok Coughing,SOB,Wheezing      Referring provider: [name], MD  HPI: 52 year old female. PMH significant for COPD/asthma overlap. Patient of Dr. Annamaria Boots, last seen in October 2023.   05/15/2022 Patient was admitted for acute hypoxic respiratory failure secondary to asthma with COPD exacerbation from 04/28/22-05/02/22.  She is followed by Dr. Annamaria Boots with pulmonary outpatient.  She is on biologic injections but stopped taking these.  Currently has urgent appeal to insurance company for prior Auth to restart.  Pulmonary consulted inpatient and patient was started on Pulmicort, Brovana and Yupelri as well as IV steroids.  Patient has chronic leukocytosis, WBC elevated at 14.8.  Procalcitonin less than 0.10, patient started on doxycycline.  She was discharged on prednisone 40 mg daily x 1 week then advised to stay on 20 mg daily.  Continue nebulizer treatments outpatient.  Did not require oxygen upon discharge.  Patient presents today for hospital follow-up.  She is on Trelegy 147mg. She did not take today. She is not currently on prednisone. She tells me she was not given prednisone when she picked up prescription at pharmacy. She is on Tezspire. States that her hand shakes so bad she  is not able to give injections to herself, she thinks she needs to come back in to clinic  She is not wearing her CPAP. Thinks she needs a new mask. She has no supplies. She does not have CPAP machine.    Allergies  Allergen Reactions   Contrast Media [Iodinated Contrast Media] Itching    CT contrast. Unknown per PT.     Immunization History  Administered Date(s) Administered   Influenza Split 01/08/2012   Influenza Whole 03/03/2009, 12/27/2009, 12/19/2010   Influenza, High Dose Seasonal PF 01/12/2019   Influenza,inj,Quad PF,6+ Mos  12/29/2012, 12/31/2013, 03/07/2015, 01/23/2016, 01/06/2017, 01/11/2020, 02/07/2021, 01/28/2022   PFIZER(Purple Top)SARS-COV-2 Vaccination 03/29/2020, 04/29/2020   PNEUMOCOCCAL CONJUGATE-20 02/20/2021   PPD Test 09/25/2011, 07/17/2012, 06/25/2013, 05/21/2016, 11/11/2017   Pneumococcal Polysaccharide-23 03/17/2009, 07/06/2016, 10/20/2016   Tdap 12/19/2010    Past Medical History:  Diagnosis Date   Acanthosis nigricans    Anxiety    Arthritis    "knees" (04/28/2017)   Asthma    Followed by Dr. YAnnamaria Boots(pulmonology); receives every other week omalizumab injections; has frequent exacerbations   Back pain    Chronic diastolic CHF (congestive heart failure) ([facility] 01/17/2017   COPD (chronic obstructive pulmonary disease) ([facility]    PFTs in 2002, FEV1/FVC 65, no post bronchodilater test done   Depression    GERD (gastroesophageal reflux disease)    Headache(784.0)    "q couple days" (141/74/0814   Helicobacter pylori (H. pylori) infection    Hypertension, essential    Insomnia    Joint pain    Lower extremity edema    Menorrhagia    Morbid obesity ([facility]    OSA on CPAP    Sleep study 2008 - mild OSA, not enough events to titrate CPAP; wears CPAP now/pt on 04/28/2017   Pneumonia X 1   Prediabetes    Rheumatoid arthritis (HCC)    Seasonal allergies    Shortness of breath    Tobacco user    Vitamin D deficiency     Tobacco History: Social History   Tobacco Use  Smoking Status Former   Packs/day: 0.50  Years: 26.00   Total pack years: 13.00   Types: Cigarettes   Quit date: 09/12/2014   Years since quitting: 7.7  Smokeless Tobacco Never  Tobacco Comments   Has vaped before, caught in the hospital   Counseling given: Not Answered Tobacco comments: Has vaped before, caught in the hospital   Outpatient Medications Prior to Visit  Medication Sig Dispense Refill   albuterol (PROAIR HFA) 108 (90 Base) MCG/ACT inhaler Inhale 2 puffs into the lungs every 6 (six) hours as needed for  wheezing or shortness of breath. (Patient taking differently: Inhale 2 puffs into the lungs as needed for wheezing or shortness of breath.) 8.5 g 11   amLODipine (NORVASC) 10 MG tablet Take 1 tablet (10 mg total) by mouth daily. 90 tablet 1   apixaban (ELIQUIS) 5 MG TABS tablet Take 1 tablet (5 mg total) by mouth 2 (two) times daily. 180 tablet 1   baclofen (LIORESAL) 20 MG tablet Take 20-40 mg by mouth 3 (three) times daily as needed for muscle spasms.     carvedilol (COREG) 6.25 MG tablet TAKE 1 TABLET(6.25 MG) BY MOUTH TWICE DAILY WITH A MEAL (Patient taking differently: Take 6.25 mg by mouth daily.) 180 tablet 1   clonazePAM (KLONOPIN) 0.5 MG tablet Take 1 tablet (0.5 mg total) by mouth daily as needed for anxiety. 20 tablet 2   cloNIDine (CATAPRES) 0.2 MG tablet Take 1 tablet (0.2 mg total) by mouth 2 (two) times daily. 60 tablet 3   EPINEPHrine 0.3 mg/0.3 mL IJ SOAJ injection Inject 0.3 mg into the muscle as needed for anaphylaxis. 1 each 5   famotidine (PEPCID) 40 MG tablet Take 40 mg by mouth daily.     FLUoxetine (PROZAC) 20 MG capsule Take 1 capsule (20 mg total) by mouth daily. Take 1 capsule daily. 30 capsule 1   fluticasone (FLONASE) 50 MCG/ACT nasal spray SHAKE LIQUID AND USE 1 SPRAY IN EACH NOSTRIL DAILY AS NEEDED FOR ALLERGIES OR RHINITIS (Patient taking differently: Place 1 spray into both nostrils as needed for allergies.) 16 g 0   furosemide (LASIX) 20 MG tablet TAKE 1 TABLET(20 MG) BY MOUTH DAILY (Patient taking differently: Take 20 mg by mouth daily.) 90 tablet 0   ipratropium-albuterol (DUONEB) 0.5-2.5 (3) MG/3ML SOLN 1 neb every 6 hours if needed for asthma (Patient taking differently: Take 3 mLs by nebulization every 4 (four) hours as needed (shortness of breath).) 75 mL 12   levothyroxine (SYNTHROID) 50 MCG tablet Take 1 tablet (50 mcg total) by mouth daily. 90 tablet 1   montelukast (SINGULAIR) 10 MG tablet Take 1 tablet (10 mg total) by mouth at bedtime. 30 tablet 2    naloxone (NARCAN) nasal spray 4 mg/0.1 mL Place 1 spray into the nose once as needed (opioid overdose).     oxyCODONE-acetaminophen (PERCOCET) 10-325 MG tablet Take 1 tablet by mouth 5 (five) times daily as needed for pain. 150 tablet 0   phentermine 15 MG capsule Take 15 mg by mouth daily.     potassium chloride SA (KLOR-CON M) 20 MEQ tablet TAKE 1 TABLET(20 MEQ) BY MOUTH DAILY (Patient taking differently: Take 20 mEq by mouth daily.) 90 tablet 3   pregabalin (LYRICA) 300 MG capsule Take 300 mg by mouth in the morning, at noon, and at bedtime.     Semaglutide, 1 MG/DOSE, (OZEMPIC, 1 MG/DOSE,) 4 MG/3ML SOPN Inject 1 mg into the skin once a week.     topiramate (TOPAMAX) 200 MG tablet Take 200 mg  by mouth at bedtime.     Vitamin D, Ergocalciferol, (DRISDOL) 1.25 MG (50000 UNIT) CAPS capsule Take 50,000 Units by mouth once a week.     zolpidem (AMBIEN) 10 MG tablet TAKE 1 TABLET BY MOUTH AT BEDTIME AS NEEDED FOR SLEEP (Patient taking differently: Take 10 mg by mouth at bedtime.) 30 tablet 5   Fluticasone-Umeclidin-Vilant 100-62.5-25 MCG/ACT AEPB Inhale 1 puff into the lungs daily. 60 each 5   predniSONE (DELTASONE) 10 MG tablet Take Prednisone 40 mg po daily x 1 week, then Prednisone 20 mg po daily till seen by Pulmonology clinic 50 tablet 2   Tezepelumab-ekko (TEZSPIRE) 210 MG/1.91ML SOAJ Inject 210 mg into the skin every 28 (twenty-eight) days. 1.91 mL 5   No facility-administered medications prior to visit.    Review of Systems  Review of Systems  HENT:  Positive for postnasal drip.   Respiratory:  Positive for wheezing. Negative for cough.     Physical Exam  BP 130/82 (BP Location: Left Arm, Patient Position: Sitting, Cuff Size: Normal)   Pulse 87   Ht '5\' 5"'$  (1.651 m)   Wt (!) 314 lb 8 oz (142.7 kg)   SpO2 99%   BMI 52.34 kg/m  Physical Exam Constitutional:      General: She is not in acute distress.    Appearance: Normal appearance. She is obese. She is not ill-appearing.   HENT:     Head: Normocephalic and atraumatic.     Mouth/Throat:     Mouth: Mucous membranes are moist.     Pharynx: Oropharynx is clear.  Cardiovascular:     Rate and Rhythm: Normal rate and regular rhythm.  Pulmonary:     Breath sounds: Wheezing present.  Musculoskeletal:        General: Normal range of motion.  Skin:    General: Skin is warm and dry.  Neurological:     General: No focal deficit present.     Mental Status: She is alert and oriented to person, place, and time. Mental status is at baseline.  Psychiatric:        Mood and Affect: Mood normal.        Behavior: Behavior normal.        Thought Content: Thought content normal.        Judgment: Judgment normal.      Lab Results:  CBC    Component Value Date/Time   WBC 15.0 (H) 05/01/2022 0015   RBC 4.25 05/01/2022 0015   RBC 4.32 05/01/2022 0015   HGB 11.1 (L) 05/01/2022 0015   HGB 10.4 (L) 01/22/2018 1403   HCT 36.6 05/01/2022 0015   HCT 33.3 (L) 01/22/2018 1403   PLT 393 05/01/2022 0015   PLT 616 (H) 01/22/2018 1403   MCV 84.7 05/01/2022 0015   MCV 82 01/22/2018 1403   MCH 25.7 (L) 05/01/2022 0015   MCHC 30.3 05/01/2022 0015   RDW 19.0 (H) 05/01/2022 0015   RDW 17.1 (H) 01/22/2018 1403   LYMPHSABS 2.0 05/01/2022 0015   LYMPHSABS 3.3 (H) 01/22/2018 1403   MONOABS 0.8 05/01/2022 0015   EOSABS 0.0 05/01/2022 0015   EOSABS 0.3 01/22/2018 1403   BASOSABS 0.0 05/01/2022 0015   BASOSABS 0.1 01/22/2018 1403    BMET    Component Value Date/Time   NA 135 05/01/2022 0015   NA 139 03/11/2022 1513   K 3.7 05/01/2022 0015   CL 108 05/01/2022 0015   CO2 19 (L) 05/01/2022 0015   GLUCOSE 152 (H)  05/01/2022 0015   BUN 24 (H) 05/01/2022 0015   BUN 13 03/11/2022 1513   CREATININE 0.93 05/01/2022 0015   CREATININE 0.56 05/21/2016 1708   CALCIUM 8.5 (L) 05/01/2022 0015   GFRNONAA >60 05/01/2022 0015   GFRNONAA >89 05/21/2016 1708   GFRAA >60 02/20/2019 0312   GFRAA >89 05/21/2016 1708    BNP     Component Value Date/Time   BNP 58.9 04/28/2022 0810    ProBNP    Component Value Date/Time   PROBNP 92.4 05/20/2012 0622    Imaging: SLEEP STUDY DOCUMENTS  Result Date: 05/21/2022 Ordered by an unspecified provider.    Assessment & Plan:   Asthma-COPD overlap syndrome ([place]) -Hospitalized in late December for acute exacerbation.  Started on Pulmicort, Brovana and Yupelri.  Discharged on prednisone 40 mg daily and then advised to stay on 20 mg daily.  Appeal is in for patient to be restarted on biologic.  Acute respiratory failure ([place]) -Resolved; no oxygen requirements  Obstructive sleep apnea - HST August 2020 showed mild OSA, AHI 9.7/hr - Break in therapy, no longer has CPAP machine. Needs split night sleep study  - Resume CPAP use nightly once we can get you a new CPAP after sleep study  COPD mixed type ([place]) - Poorly controlled. She was hospitalized for respiratory failure secondary to asthma exacerbation from 04/28/22-05/02/22. She was supposed to be discharged on prednisone but never received this prescription. We will send in prednisone taper and then have her stay on '20mg'$  daily until follow-up with Dr. Annamaria Boots. She also needs to resume biologics. She was on tezspire but is unable to given injections to herself, we will look into getting her set up in office. Recommend increase Trelegy 234mg one puff daily in hopes to get off systemic steroids.    Recommendations - Increase Trelegy 2049m one puff daily in the morning  - Use Duoneb every 6 hours for wheezing/shortness of breath - Start prednisone 05/16/22; then stay on '20mg'$  daily until visit with Dr. YoAnnamaria Boots- Resume CPAP use nightly once we can get you a new CPAP after sleep study - I have reached out to pharmacy team about getting Tezspire injections in office  Orders: - Split night sleep study   Office treatment: - Depo-medrol '80mg'$  IM x 1  Follow-up: -  MUST keep appointment on February 5th with Dr. YoAnnamaria BootsPlease  bring ALL medications in with you to visit)   ElMartyn EhrichNP 06/03/2022

## 2022-05-15 NOTE — Assessment & Plan Note (Signed)
-  Hospitalized in late December for acute exacerbation.  Started on Pulmicort, Brovana and Yupelri.  Discharged on prednisone 40 mg daily and then advised to stay on 20 mg daily.  Appeal is in for patient to be restarted on biologic.

## 2022-05-15 NOTE — Assessment & Plan Note (Addendum)
-   HST August 2020 showed mild OSA, AHI 9.7/hr - Break in therapy, no longer has CPAP machine. Needs split night sleep study  - Resume CPAP use nightly once we can get you a new CPAP after sleep study

## 2022-05-15 NOTE — Telephone Encounter (Signed)
Patient is on Tezspire, she does not feel comfortable giving herself injections any more. Can she be set up to receive in office? Also was urgent PA approved?

## 2022-05-16 ENCOUNTER — Encounter: Payer: Self-pay | Admitting: Internal Medicine

## 2022-05-16 ENCOUNTER — Ambulatory Visit: Payer: Medicaid Other | Admitting: Family Medicine

## 2022-05-16 ENCOUNTER — Ambulatory Visit: Payer: Self-pay

## 2022-05-16 NOTE — Telephone Encounter (Signed)
     Chief Complaint: [place] on ice yesterday, fell back on buttocks. Asking if today's appointment can be made into virtual visit. Symptoms: Legs are painful Frequency: Yesterday Pertinent Negatives: Patient denies lacerations Disposition: '[]'$ ED /'[]'$ Urgent Care (no appt availability in office) / '[]'$ Appointment(In office/virtual)/ '[]'$  [facility]/ '[]'$ Home Care/ '[]'$ Refused Recommended Disposition /'[]'$  Mobile Bus/ '[x]'$  Follow-up with PCP Additional Notes: Please advise pt.  Answer Assessment - Initial Assessment Questions 1. MECHANISM: "How did the fall happen?"     Slipped on ice 2. DOMESTIC VIOLENCE AND ELDER ABUSE SCREENING: "Did you fall because someone pushed you or tried to hurt you?" If Yes, ask: "Are you safe now?"     No 3. ONSET: "When did the fall happen?" (e.g., minutes, hours, or days ago)     Yesterday 4. LOCATION: "What part of the body hit the ground?" (e.g., back, buttocks, head, hips, knees, hands, head, stomach)     Buttocks 5. INJURY: "Did you hurt (injure) yourself when you fell?" If Yes, ask: "What did you injure? Tell me more about this?" (e.g., body area; type of injury; pain severity)"     Legs 6. PAIN: "Is there any pain?" If Yes, ask: "How bad is the pain?" (e.g., Scale 1-10; or mild,  moderate, severe)   - NONE (0): No pain   - MILD (1-3): Doesn't interfere with normal activities    - MODERATE (4-7): Interferes with normal activities or awakens from sleep    - SEVERE (8-10): Excruciating pain, unable to do any normal activities      10 7. SIZE: For cuts, bruises, or swelling, ask: "How large is it?" (e.g., inches or centimeters)      N/a 8. PREGNANCY: "Is there any chance you are pregnant?" "When was your last menstrual period?"     NO 9. OTHER SYMPTOMS: "Do you have any other symptoms?" (e.g., dizziness, fever, weakness; new onset or worsening).      None 10. CAUSE: "What do you think caused the fall (or falling)?" (e.g., tripped, dizzy  spell)       Slipped  Protocols used: Falls and [facility]

## 2022-05-16 NOTE — Telephone Encounter (Signed)
I spoke with patient because she was transitioned to [place] self-administration in July 2023. She had successfully completed injection. She also appears to have been adherent based on fill history. She states that when she administers, her hand is shaky and does not feel comfortable with her hand strength in holding injection.  Referral placed back to [employer] center. She states her next dose is due "at the end of month."   [name], PharmD, MPH, BCPS, CPP Clinical Pharmacist (Rheumatology and Pulmonology)

## 2022-05-17 ENCOUNTER — Other Ambulatory Visit (HOSPITAL_COMMUNITY): Payer: Self-pay

## 2022-05-17 MED ORDER — TEZSPIRE 210 MG/1.91ML ~~LOC~~ SOAJ
210.0000 mg | SUBCUTANEOUS | 5 refills | Status: DC
  Filled 2022-05-17 (×2): qty 1.91, 28d supply, fill #0
  Filled 2022-06-24: qty 1.91, 28d supply, fill #1
  Filled 2022-07-18 (×2): qty 1.91, 28d supply, fill #2
  Filled 2022-08-15: qty 1.91, 28d supply, fill #3
  Filled 2022-09-18: qty 1.91, 28d supply, fill #4
  Filled 2022-10-14: qty 1.91, 28d supply, fill #5

## 2022-05-17 NOTE — Telephone Encounter (Signed)
Rx for Tezspire sent to [facility] to be couriered to infusion center.  Based on the schedule I provided for patient, she would be due for next dose on Monday, 05/20/2022 however okay to delay by a few days pending coordination of benefits.  April [name] has set up shipment of Tezspire to arrive to infusion center on Wednesday, 05/22/2022.  [name], PharmD, MPH, BCPS, CPP Clinical Pharmacist (Rheumatology and Pulmonology)

## 2022-05-19 ENCOUNTER — Ambulatory Visit (HOSPITAL_BASED_OUTPATIENT_CLINIC_OR_DEPARTMENT_OTHER): Payer: 59 | Attending: Primary Care | Admitting: Internal Medicine

## 2022-05-19 VITALS — Ht 66.0 in | Wt 315.0 lb

## 2022-05-19 DIAGNOSIS — R0683 Snoring: Secondary | ICD-10-CM

## 2022-05-19 DIAGNOSIS — Z6841 Body Mass Index (BMI) 40.0 and over, adult: Secondary | ICD-10-CM | POA: Diagnosis not present

## 2022-05-19 DIAGNOSIS — G4710 Hypersomnia, unspecified: Secondary | ICD-10-CM | POA: Diagnosis not present

## 2022-05-19 DIAGNOSIS — J449 Chronic obstructive pulmonary disease, unspecified: Secondary | ICD-10-CM | POA: Diagnosis not present

## 2022-05-19 DIAGNOSIS — G4733 Obstructive sleep apnea (adult) (pediatric): Secondary | ICD-10-CM

## 2022-05-19 DIAGNOSIS — G4489 Other headache syndrome: Secondary | ICD-10-CM | POA: Diagnosis not present

## 2022-05-19 DIAGNOSIS — I110 Hypertensive heart disease with heart failure: Secondary | ICD-10-CM | POA: Diagnosis not present

## 2022-05-19 DIAGNOSIS — I509 Heart failure, unspecified: Secondary | ICD-10-CM | POA: Insufficient documentation

## 2022-05-19 DIAGNOSIS — E669 Obesity, unspecified: Secondary | ICD-10-CM | POA: Diagnosis not present

## 2022-05-23 ENCOUNTER — Ambulatory Visit (INDEPENDENT_AMBULATORY_CARE_PROVIDER_SITE_OTHER): Payer: Medicare Other | Admitting: Licensed Clinical Social Worker

## 2022-05-23 DIAGNOSIS — F331 Major depressive disorder, recurrent, moderate: Secondary | ICD-10-CM

## 2022-05-23 DIAGNOSIS — F411 Generalized anxiety disorder: Secondary | ICD-10-CM

## 2022-05-23 DIAGNOSIS — F4310 Post-traumatic stress disorder, unspecified: Secondary | ICD-10-CM

## 2022-05-23 NOTE — Progress Notes (Signed)
Pt requested audio visit at time of session  Virtual Visit via Video Note  I connected with April [name] on 05/23/22 at  3:00 PM EST by a video enabled telemedicine application and verified that I am speaking with the correct person using two identifiers.  Location: Patient: home Provider: [place] [facility]   I discussed the limitations of evaluation and management by telemedicine and the availability of in person appointments. The patient expressed understanding and agreed to proceed.  I discussed the assessment and treatment plan with the patient. The patient was provided an opportunity to ask questions and all were answered. The patient agreed with the plan and demonstrated an understanding of the instructions.   The patient was advised to call back or seek an in-person evaluation if the symptoms worsen or if the condition fails to improve as anticipated.  I provided 30 minutes of non-face-to-face time during this encounter.   April [name] R Eleanna Theilen, LCSW   THERAPIST PROGRESS NOTE  Session Time: 3-330p  Participation Level: Active  Behavioral Response: CasualAlertAnxious and Depressed  Type of Therapy: Individual Therapy  Treatment Goals addressed: Develop healthy thinking patterns and beliefs about self, others, and the world that lead to the alleviation and help prevent the relapse of depression per self report 3 out of 5 sessions documented   Learn and implement coping skills that result in a reduction of anxiety and worry, and improve daily functioning per pt report 3 out of 5 sessions documented   ProgressTowards Goals: Progressing  Interventions: Solution Focused and Supportive  Summary: April [name] is a 52 y.o. female who presents with continuing symptoms related to depression and trauma/anxiety.   Allowed pt to explore and express thoughts and feelings associated with recent life situations and external stressors. Pt reports that she is  not feeling well physically after a recent fall on ice--pt states getting around is difficult. Pt states she has been in the hospital 3 x since last session for breathing-related issues. Pt reports that she is currently working with [facility] to get a housing placement. Pt states that she hates where she is currently residing--"this house makes me depressed". Pt states that she keeps blinds closed and the house dark all day. Encouraged pt to let natural light in but pt states she doesn't feel comfortable doing that since it is a first-floor residence.   Pt upset that she was not able to see grandchildren over the holidays--pt states that the "other grandmother" keeps the children from seeing her. Pt reports that she has attempted communication multiple times unsuccessfully with the grandchildren. Allowed pt safe space to explore feelings about this.   Pt states that when she was in the hospital, she feels they "messed up my medications" pt states that she was supposed to discontinue cymbalta and start prozac, but she thinks the hospital put her back on the cymbalta. Encouraged pt to reach out to schedule psychiatric medication provider visit to manage meds. Pt agrees and stated she would call.   Discussed decreased eating--pt states that 1-2 days per week sometimes pt won't eat at all. Pt states its not that she physically can't prepare food--she just doesn't want anything. Pt states she has an aid that comes over every day that could help with food preparation as needed.   Pt reports good quality and quantity of sleep. Pt states she did a sleep study while in the hospital but is unsure of the report.   Reviewed coping skills for managing depression.  Encouraged positive social engagement w/ friends/family.  Continued recommendations are as follows: self care behaviors, positive social engagements, focusing on overall work/home/life balance, and focusing on positive physical and emotional wellness.    Suicidal/Homicidal: No  Therapist Response: Pt is continuing to apply interventions learned in session into daily life situations. Pt is currently on track to meet goals utilizing interventions mentioned above. Personal growth and progress noted. Treatment to continue as indicated.   Plan: Return again in 4 weeks.  Diagnosis:  Encounter Diagnoses  Name Primary?   Moderate episode of recurrent major depressive disorder (HCC) Yes   PTSD (post-traumatic stress disorder)    Generalized anxiety disorder     Collaboration of Care: Other pt encouraged to continue care with psychiatric provider of record, Dr. Armando Reichert.   Patient/Guardian was advised Release of Information must be obtained prior to any record release in order to collaborate their care with an outside provider. Patient/Guardian was advised if they have not already done so to contact the registration department to sign all necessary forms in order for [place] to release information regarding their care.   Consent: Patient/Guardian gives verbal consent for treatment and assignment of benefits for services provided during this visit. Patient/Guardian expressed understanding and agreed to proceed.   [place], LCSW 05/23/2022

## 2022-05-25 DIAGNOSIS — G4733 Obstructive sleep apnea (adult) (pediatric): Secondary | ICD-10-CM

## 2022-05-25 NOTE — Procedures (Signed)
    Patient Name: April [name], April [name] Date: 05/19/2022 Gender: Female D.O.B: [birth year] Age (years): 54 Referring Provider: Geraldo Pitter NP Height (inches): 32 Interpreting Physician: Baird Lyons MD, ABSM Weight (lbs): 315 RPSGT: Jorge Ny BMI: 51 MRN: [id number] Neck Size: 15.75  CLINICAL INFORMATION Sleep Study Type: NPSG Indication for sleep study: Congestive Heart Failure, COPD, Excessive Daytime Sleepiness, Hypertension, Morning Headaches, Obesity, OSA, Re-Evaluation, Snoring, Witnessed Apneas Epworth Sleepiness Score: 11  Most recent polysomnogram dated 06/27/2016 revealed an AHI of 11.0/h and RDI of 23.7/h, body weight 340 lbs.  SLEEP STUDY TECHNIQUE As per the AASM Manual for the Scoring of Sleep and Associated Events v2.3 (April 2016) with a hypopnea requiring 4% desaturations.  The channels recorded and monitored were frontal, central and occipital EEG, electrooculogram (EOG), submentalis EMG (chin), nasal and oral airflow, thoracic and abdominal wall motion, anterior tibialis EMG, snore microphone, electrocardiogram, and pulse oximetry.  MEDICATIONS Medications self-administered by patient taken the night of the study : CLONAZEPAM  SLEEP ARCHITECTURE The study was initiated at 10:45:55 PM and ended at 5:12:46 AM.  Sleep onset time was 36.1 minutes and the sleep efficiency was 78.7%. The total sleep time was 304.5 minutes.  Stage REM latency was 194.5 minutes.  The patient spent 4.8% of the night in stage N1 sleep, 88.0% in stage N2 sleep, 0.0% in stage N3 and 7.2% in REM.  Alpha intrusion was absent.  Supine sleep was 24.46%.  RESPIRATORY PARAMETERS The overall apnea/hypopnea index (AHI) was 0.0 per hour. There were 0 total apneas, including 0 obstructive, 0 central and 0 mixed apneas. There were 0 hypopneas and 0 RERAs.  The AHI during Stage REM sleep was 0.0 per hour.  AHI while supine was 0.0 per hour.  The mean oxygen saturation was 97.8%. The  minimum SpO2 during sleep was 95.0%.  moderate snoring was noted during this study.  CARDIAC DATA The 2 lead EKG demonstrated sinus rhythm. The mean heart rate was 101.2 beats per minute. Other EKG findings include: PVCs.  LEG MOVEMENT DATA The total PLMS were 0 with a resulting PLMS index of 0.0. Associated arousal with leg movement index was 2.2 .  IMPRESSIONS - No significant obstructive sleep apnea occurred during this study (AHI = 0.0/h). - The patient had minimal or no oxygen desaturation during the study (Min O2 = 95.0%) - The patient snored with moderate snoring volume. - EKG findings include PVCs. - Clinically significant periodic limb movements did not occur during sleep. No significant associated arousals.  DIAGNOSIS - Primary Snoring  RECOMMENDATIONS - Manage for snoring and symptoms based on clinical judgment. - Sleep hygiene should be reviewed to assess factors that may improve sleep quality. - Weight management and regular exercise should be initiated or continued if appropriate.  [Electronically signed] 05/25/2022 10:47 AM  Baird Lyons MD, ABSM Diplomate, American Board of Sleep Medicine NPI: [id number]                         [place] [place] [facility]  ELECTRONICALLY SIGNED ON:  05/25/2022, 10:43 AM [place] PH: [phone]   FX: [phone] [place]

## 2022-05-30 ENCOUNTER — Inpatient Hospital Stay: Admit: 2022-05-30 | Payer: Medicare Other

## 2022-06-02 NOTE — Progress Notes (Deleted)
Patient ID: April [name], female    DOB: 08-Apr-1971, 52 y.o.   MRN: [id number]   HPI F former smoker followed for chronic severe asthma/ Xolair complicated by non-compliance,  OSA/ CPAP,  morbid obesity, HBP, depression, GERD, AFib/ Eliquis  NPSG 03/31/07- AHI 5.8/ hr, weight 330 lbs. She canceled planned more recent study. Office spirometry 10/24/14- severe obstructive airways disease. FVC 2.10/66%, FEV1 1.25/47%, FEV1/FVC 0.59, FEF 25-75 percent 0.64/19% Unattended HST 07/06/16- AHI 11/hour, desaturation to 74%. Office Spirometry 07/29/2016-moderately severe obstructive airways disease. FVC 2.21/68%, FEV1 1.53/57%, ratio 0.69, FEF 25-75% 0.98/34% Prolonged hosp [facility] 4/19- tracheostomy. Dupixent enrolled 10/22/2018 HST 12/26/2018- AHI 9.7/ hr, desaturation to 9.7/ hr, desaturation to 69% -------------------------------------------------------  01/28/22- 52 year old female Smoker followed for chronic severe asthma/Xolair, Tracheostomy at [facility] 2019, Insomnia,  complicated by noncompliance, OSA/ oral appliance, morbid Obesity, HTN, depression, GERD Covid infection, PAFib, dCHF, Dyslipidemia,  Dupixent enrolled 10/22/2018 changed to [facility] 2023. O2 2L prn -Proair hfa, Trelegy  100, Singulair, Neb Brovana, Tezspire, Ambien 10, Vyvanse, Tezspire, Neb [place]?, Phentermine, Clonazepam,  Body weight today- 333 lbs Covid vax-2 Phizer Flu vax- Hosp 9/11-9/17/23  Asthma exacerbation, Acute Respiratory Failure with Hypoxia,  We discussed recent hospitalization.  She feels fairly well controlled today but always fragile.  She asks to keep prednisone on hand for use as needed and we discussed this again, particularly in view of her obesity and other health problems.  She has oxygen at home used when needed and for sleep.  Says she is getting Tezspire injections regularly.  Gives her own but would prefer that they be given here.  Question of whether she even can administer her own injection deeper than body  fat. Body weight today - 333 lbs CXR 01/07/22 1V- FINDINGS: The heart size and mediastinal contours are within normal limits. Both lungs are clear. The visualized skeletal structures are unremarkable. IMPRESSION: No active disease.  06/03/22-52 year old female Smoker followed for chronic severe asthma/Xolair, Tracheostomy at [facility] 2019, Insomnia,  complicated by noncompliance, OSA/ oral appliance, morbid Obesity, HTN, depression, GERD Covid infection, PAFib, dCHF, Dyslipidemia,  Dupixent enrolled 10/22/2018 changed to [facility] 2023. O2 2L prn -Proair hfa, Trelegy  200, Singulair, Neb Brovana, Tezspire, Ambien 10, Vyvanse, Tezspire, NebDuoneb, Phentermine, Clonazepam,  Body weight today- 333 lbs Covid vax-2 Phizer Flu vax- Blanche [place], NP 05/15/22- hosp 12/31-1/4 acute hypoxic RF/ asthma-COPD overlap.>maint pred 20 qd. NPSG 05/19/22- AHI 0/ hr, desaturation to 95%, body weight 315 lbs  Review of Systems-See HPI  + = positive Constitutional:   + weight loss, night sweats, fevers, chills, +fatigue, lassitude. HEENT:   No-  headaches, difficulty swallowing, tooth/dental problems, sore throat,    sneezing, itching, ear ache, +nasal congestion, post nasal drip,  CV:  No-   chest pain, orthopnea, PND, swelling in lower extremities, anasarca, dizziness, palpitations Resp: +shortness of breath with exertion or at rest.              productive cough,  + non-productive cough,  No- coughing up of blood.              No-   change in color of mucus. +wheezing.   Skin: No-   rash or lesions. GI:  No-   heartburn, indigestion, abdominal pain, nausea, vomiting,  GU: MS:  No-   joint pain or swelling.   Neuro-     nothing unusual Psych:  No- change in mood or affect. No depression or anxiety.  No memory loss.    Objective:  Physical Exam    General- Alert, Oriented, Affect-appropriate, Distress- none acute, + morbid  obesity,  Skin- rash-none, lesions- none, excoriation- none Lymphadenopathy-  none Head- atraumatic            Eyes- Gross vision intact, PERRLA, conjunctivae clear secretions            Ears- Hearing, canals-normal            Nose- Clear, no-Septal dev, mucus, polyps, erosion, perforation.            Throat- Mallampati III , mucosa clear , drainage- none, tonsils- atrophic,                           + Missing  teeth, +upper denture plate is out                  Neck- flexible , trachea+ stoma scar, no stridor , thyroid nl, carotid no bruit Chest - symmetrical excursion , unlabored           Heart/CV- RRR , no murmur , no gallop  , no rub, nl s1 s2                           - JVD- none , edema- none, stasis changes- none, varices- none           Lung- +Diminished, . Wheeze+end-expiratory, cough-none,  dullness-none,  rub- none            Chest wall Abd-  Br/ Gen/ Rectal- Not done, not indicated Extrem- cyanosis- none, clubbing, none, atrophy- none, strength- nl Neuro- grossly intact to observation

## 2022-06-03 ENCOUNTER — Ambulatory Visit: Payer: 59 | Admitting: Internal Medicine

## 2022-06-03 ENCOUNTER — Ambulatory Visit: Payer: Medicaid Other

## 2022-06-03 MED ORDER — TEZEPELUMAB-EKKO 210 MG/1.91ML ~~LOC~~ SOSY
210.0000 mg | PREFILLED_SYRINGE | Freq: Once | SUBCUTANEOUS | Status: DC
  Filled 2022-06-03: qty 1.91

## 2022-06-03 NOTE — Assessment & Plan Note (Addendum)
-   Poorly controlled. She was hospitalized for respiratory failure secondary to asthma exacerbation from 04/28/22-05/02/22. She was supposed to be discharged on prednisone but never received this prescription. We will send in prednisone taper and then have her stay on '20mg'$  daily until follow-up with Dr. Annamaria Boots. She also needs to resume biologics. She was on tezspire but is unable to given injections to herself, we will look into getting her set up in office. Recommend increase Trelegy 236mg one puff daily in hopes to get off systemic steroids.

## 2022-06-04 ENCOUNTER — Ambulatory Visit: Payer: Medicaid Other

## 2022-06-07 IMAGING — CR DG CHEST 2V
2 series · 2 of 2 positions shown · non-contrast
Comparison: 02/06/2021

CLINICAL DATA: Chest pain and shortness of breath. Weakness with 2
falls today.

EXAM:
CHEST - 2 VIEW

[chest lat]
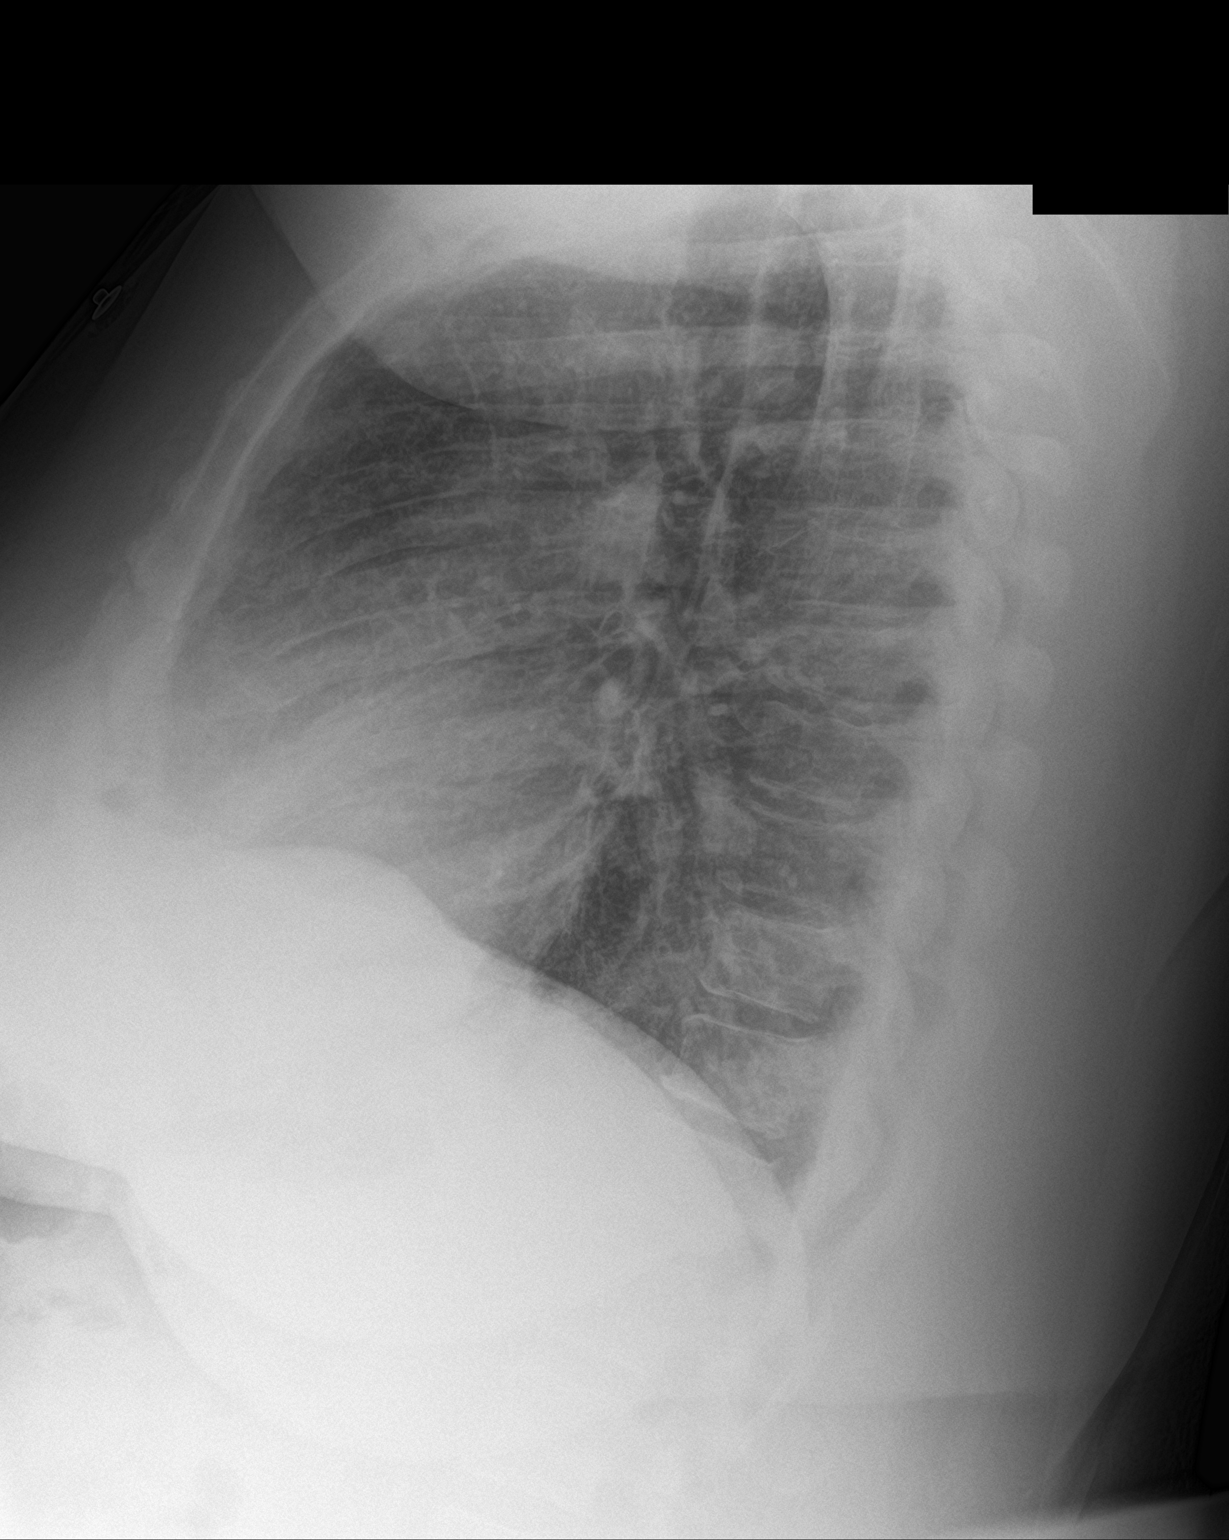

[chest ap]
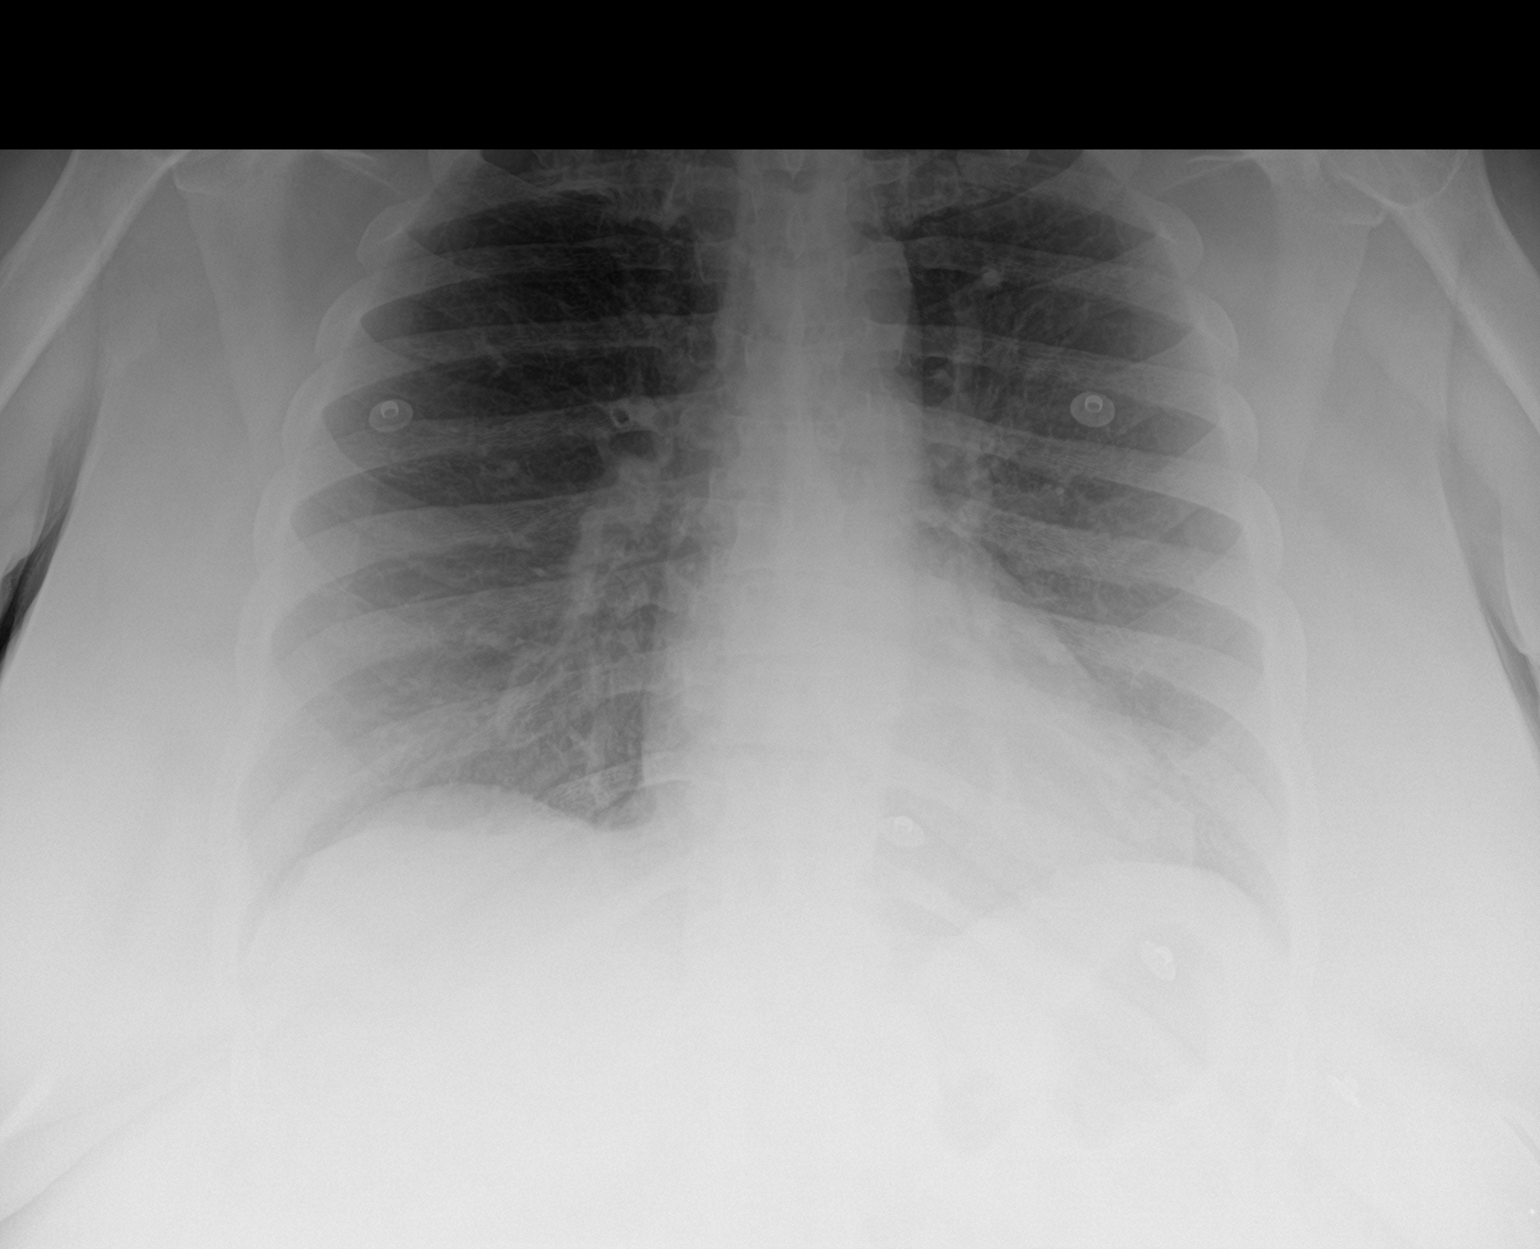

[2 of 2 positions shown; findings below may reference images not displayed]

FINDINGS: The cardiomediastinal contours are normal. Bronchial thickening
appears chronic. Pulmonary vasculature is normal. No consolidation,
pleural effusion, or pneumothorax. No acute osseous abnormalities
are seen.
IMPRESSION: No acute chest findings.  Chronic hyperinflation.

## 2022-06-08 IMAGING — CT CT ANGIO CHEST
2 of 7 series · 17 of 46 positions shown · IV contrast (APPLIED)
Comparison: 10/08/2017

CLINICAL DATA: Chest pain and shortness of breath. Generalized
weakness since earlier today. Fell 2 times today due to the
weakness. Taking Eliquis. Ex-smoker.

EXAM:
CT ANGIOGRAPHY CHEST WITH CONTRAST
TECHNIQUE: Multidetector CT imaging of the chest was performed using the
standard protocol during bolus administration of intravenous
contrast. Multiplanar CT image reconstructions and MIPs were
obtained to evaluate the vascular anatomy.

[Series 8: thins · axial · 0.98mm/px · z∈[+1064,+1315]mm · 14 of 404 slices shown]
[im 23/404  lung]
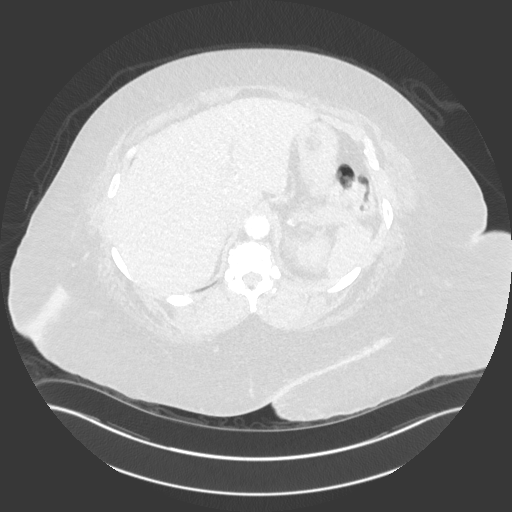
[im 45/404  soft-tissue]
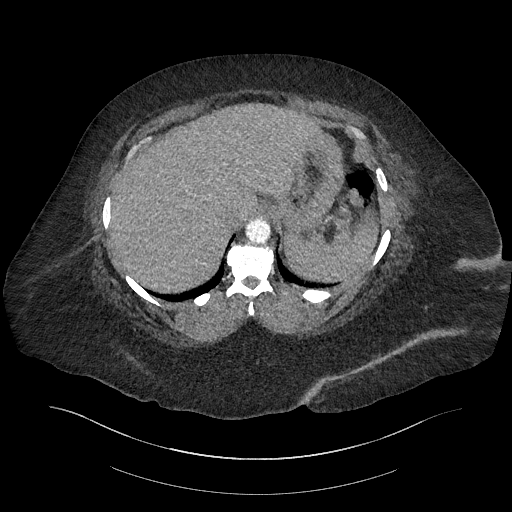
[im 90/404  lung]
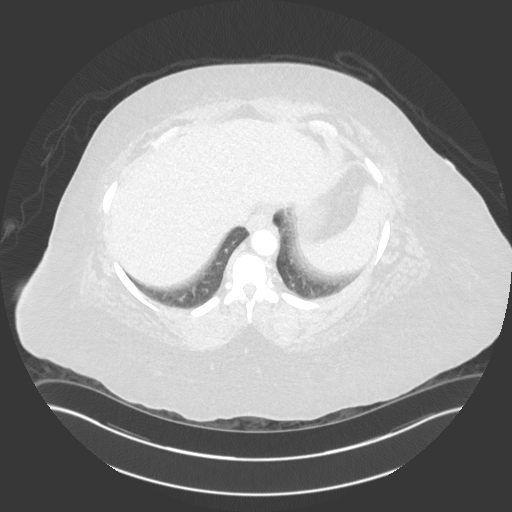
[im 112/404  soft-tissue]
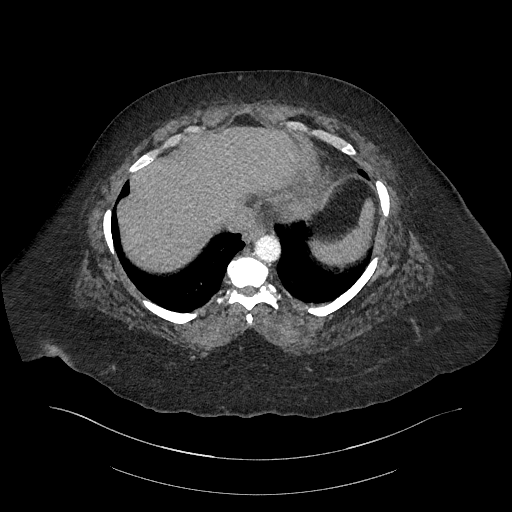
[im 135/404  lung]
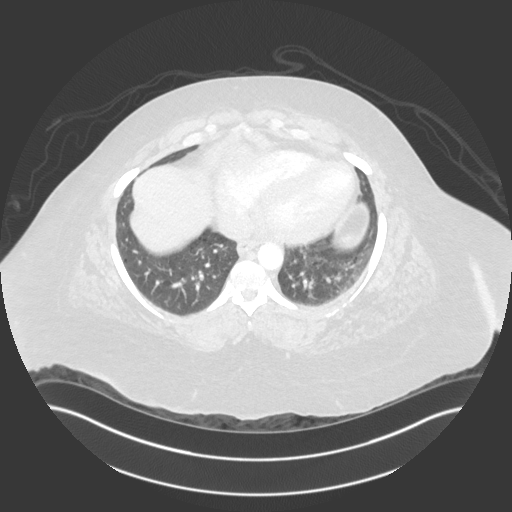
[im 157/404  soft-tissue]
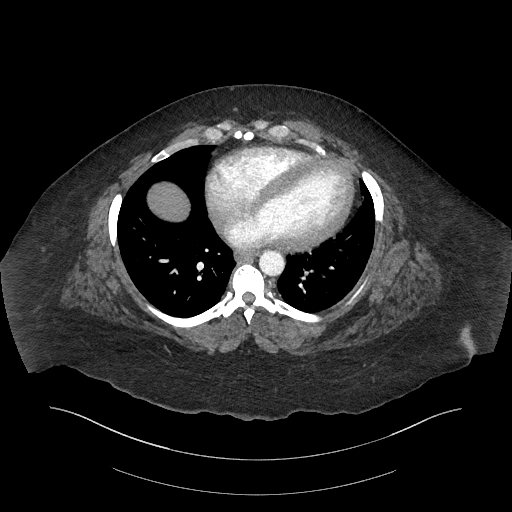
[im 180/404  lung]
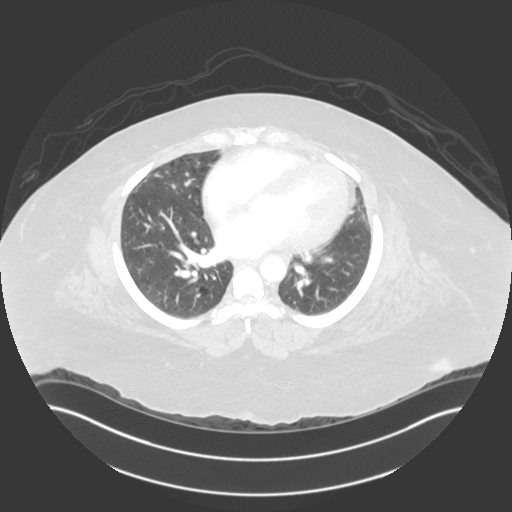
[im 224/404  soft-tissue]
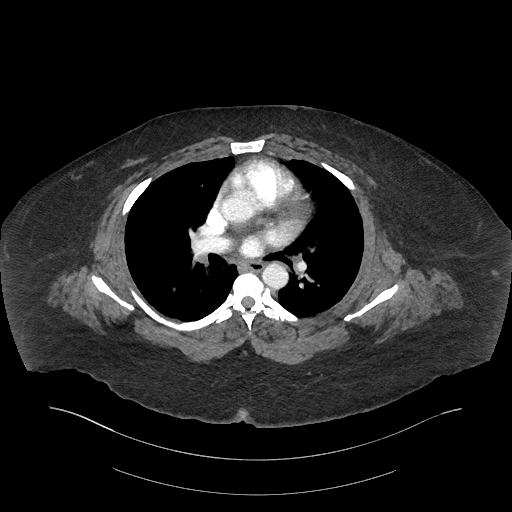
[im 247/404  lung]
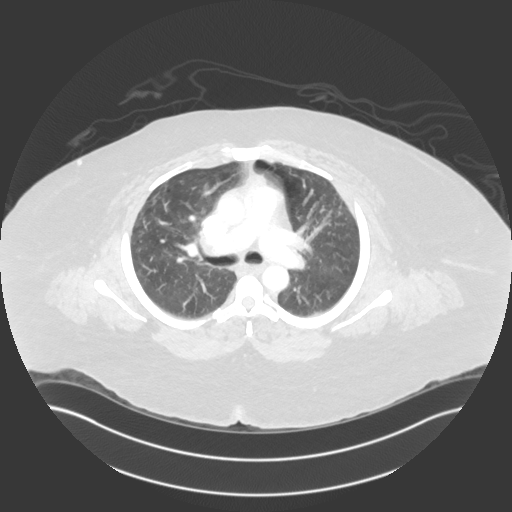
[im 269/404  soft-tissue]
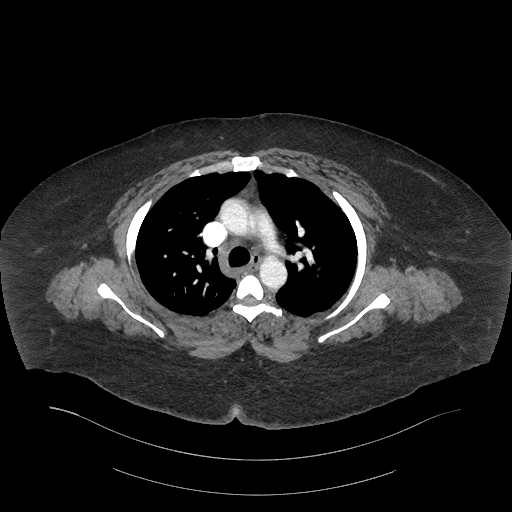
[im 292/404  lung]
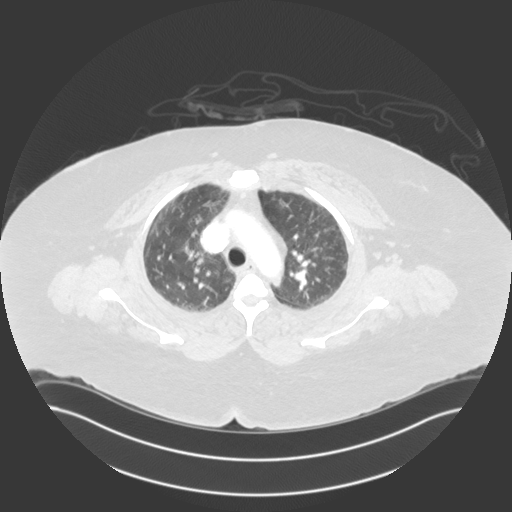
[im 314/404  soft-tissue]
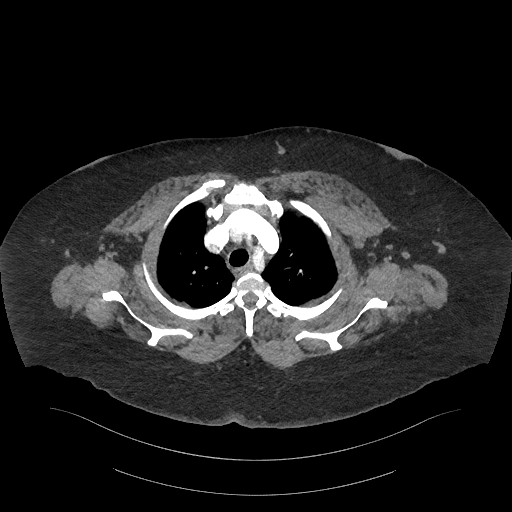
[im 359/404  lung]
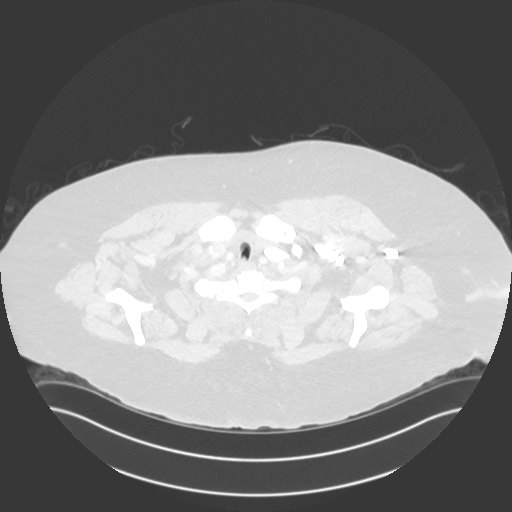
[im 381/404  soft-tissue]
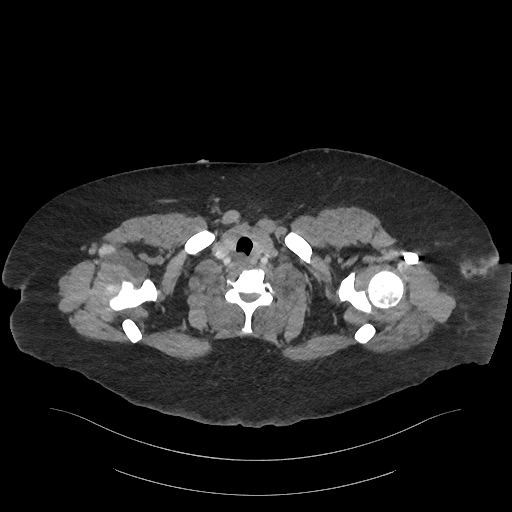

[Series 9: cor · coronal · 0.56mm/px · 3 of 187 slices shown]
[im 47/187  soft-tissue]
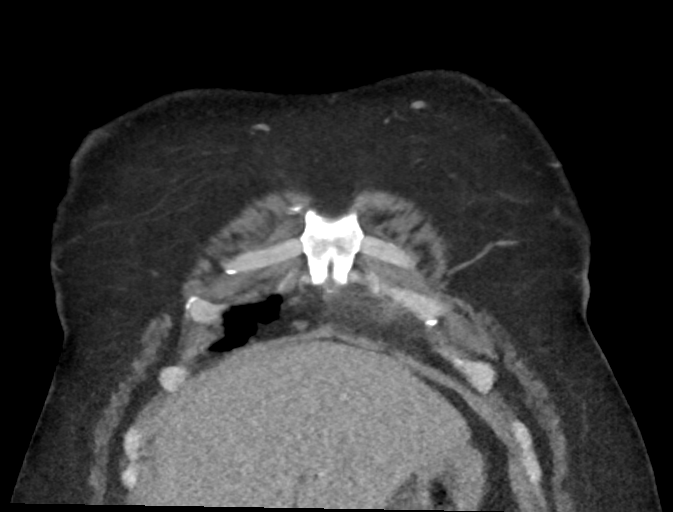
[im 94/187  soft-tissue]
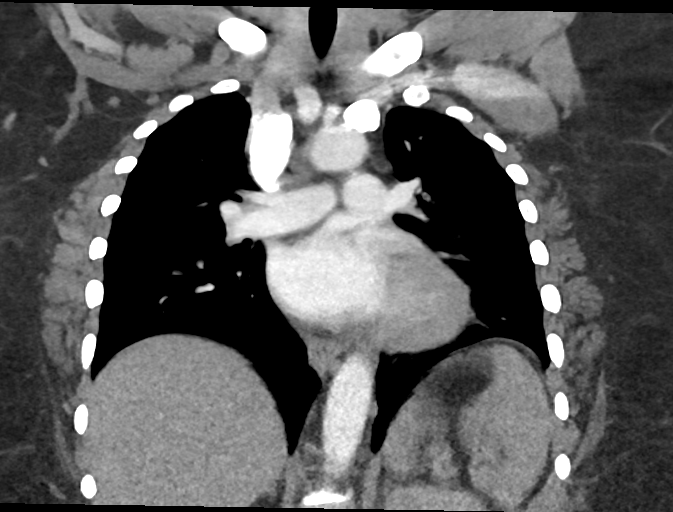
[im 140/187  soft-tissue]
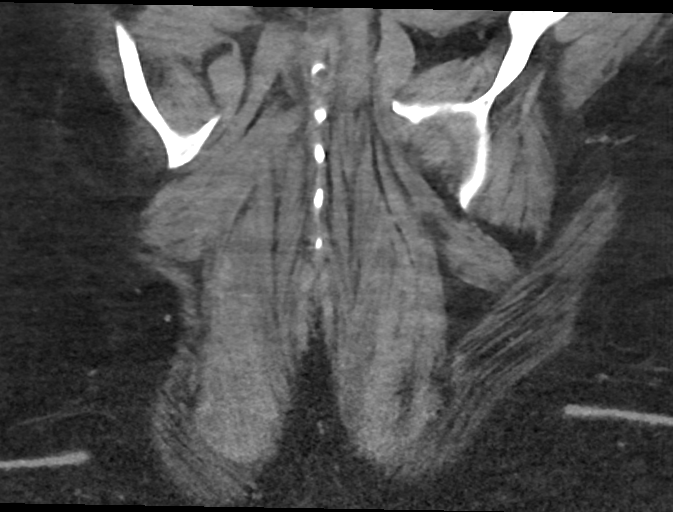

[17 of 46 positions shown; findings below may reference images not displayed]

RADIATION DOSE REDUCTION: This exam was performed according to the
departmental dose-optimization program which includes automated
exposure control, adjustment of the mA and/or kV according to
patient size and/or use of iterative reconstruction technique.

CONTRAST:  100mL OMNIPAQUE IOHEXOL 350 MG/ML SOLN
FINDINGS: Cardiovascular: Normally opacified pulmonary arteries with no
pulmonary arterial filling defects seen. Mildly enlarged heart.
Small amount of left anterior descending coronary artery
calcification.

Mediastinum/Nodes: No enlarged mediastinal, hilar, or axillary lymph
nodes. Thyroid gland, trachea, and esophagus demonstrate no
significant findings.

Lungs/Pleura: Clear lungs with mild bilateral centrilobular and
paraseptal bullous changes. No lung nodules or pleural fluid.

Upper Abdomen: Unremarkable.

Musculoskeletal: Mild lower thoracic spine degenerative changes.
Patchy sclerosis involving the majority of the C7 and T1 vertebrae.
There is some sclerosis involving the posterior aspect of the C6
vertebra. These changes have progressed since 10/08/2017, when there
was milder, more patchy sclerosis involving those vertebrae at that
time. There are mild lower cervical spine degenerative changes.

Review of the MIP images confirms the above findings.
IMPRESSION: 1. No pulmonary emboli or acute abnormality.
2. Mild bilateral centrilobular and paraseptal bullous emphysema.
3. Mild atheromatous calcifications involving the left anterior
descending coronary artery.
4. Progressive changes of discogenic sclerosis involving the C6, C7
and T1 vertebrae.

Emphysema ([id number]).

## 2022-06-09 IMAGING — CR DG LUMBAR SPINE 2-3V
3 series · 3 of 3 positions shown · non-contrast
Comparison: None.

CLINICAL DATA: Lower back pain after fall

EXAM:
LUMBAR SPINE - 2-3 VIEW

[l-spine ap]
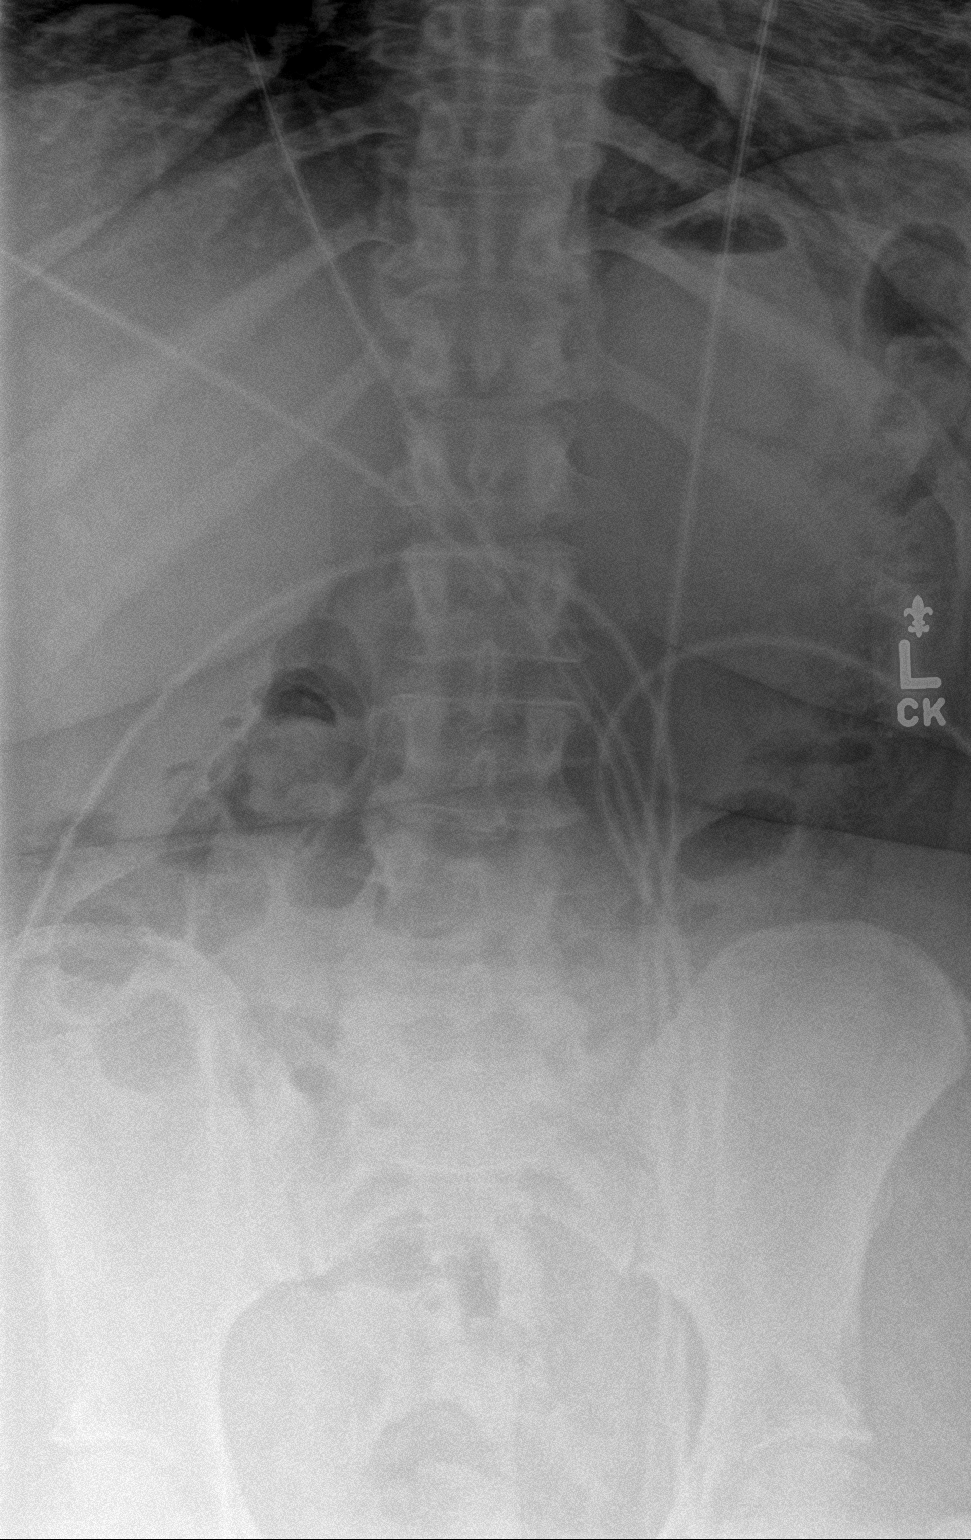

[l-spine lat]
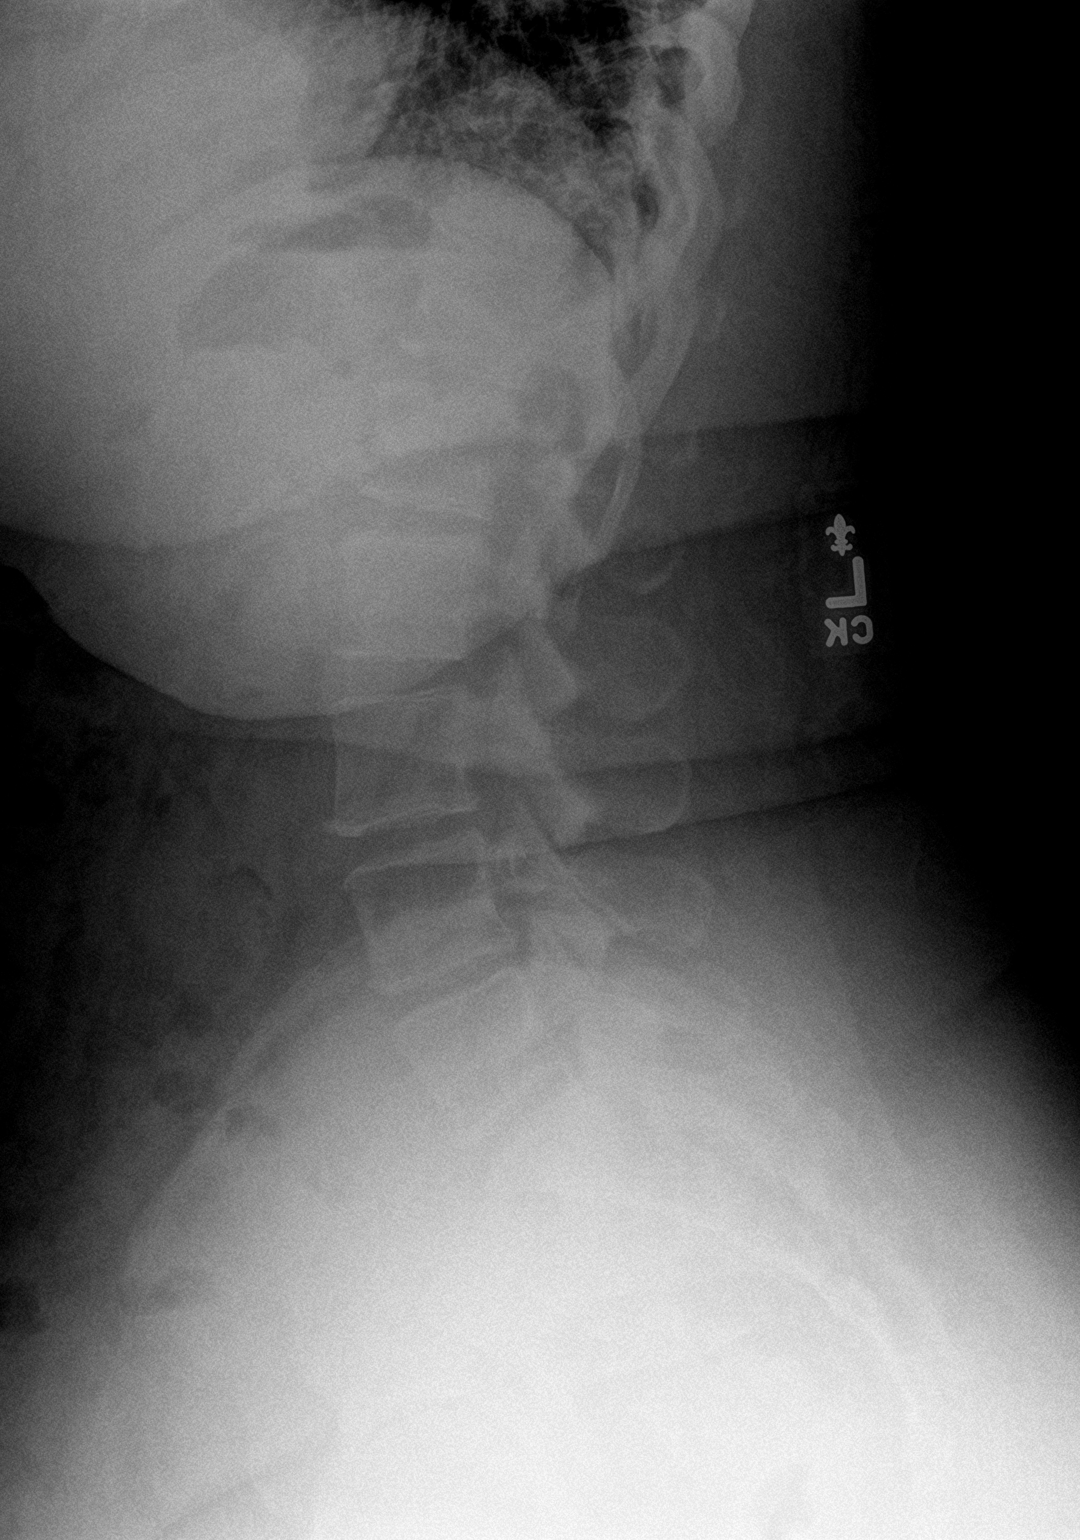

[l-spine spot]
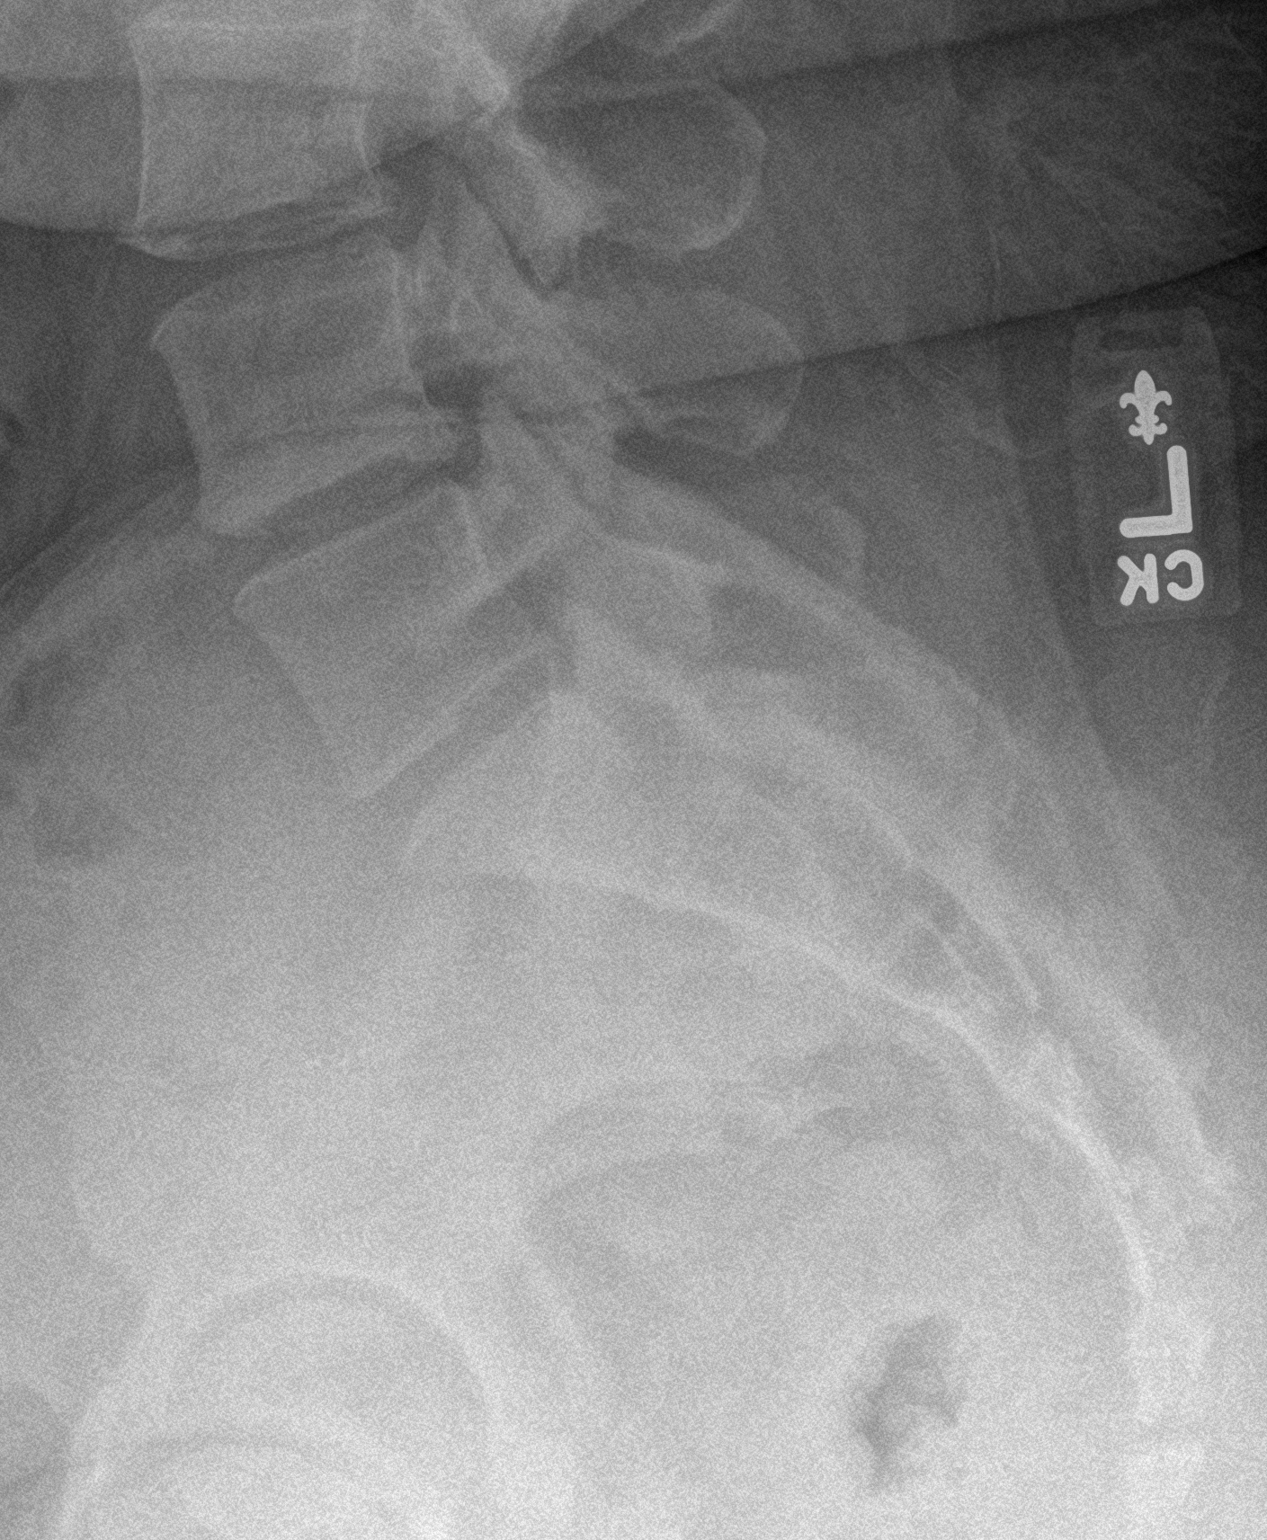

[3 of 3 positions shown; findings below may reference images not displayed]

FINDINGS: Suboptimal detail due to body habitus. Mild disc space narrowing at
L3-4 and below where there is also likely mild facet spurring. No
evidence of fracture or bone lesion. No listhesis.
IMPRESSION: No acute finding.

## 2022-06-10 ENCOUNTER — Other Ambulatory Visit (HOSPITAL_COMMUNITY): Payer: Self-pay

## 2022-06-11 ENCOUNTER — Ambulatory Visit (INDEPENDENT_AMBULATORY_CARE_PROVIDER_SITE_OTHER): Payer: 59 | Admitting: Orthopaedic Surgery

## 2022-06-11 ENCOUNTER — Ambulatory Visit (INDEPENDENT_AMBULATORY_CARE_PROVIDER_SITE_OTHER): Payer: 59

## 2022-06-11 ENCOUNTER — Ambulatory Visit: Payer: Self-pay

## 2022-06-11 ENCOUNTER — Ambulatory Visit: Payer: Medicare Other | Admitting: Family Medicine

## 2022-06-11 DIAGNOSIS — Z6841 Body Mass Index (BMI) 40.0 and over, adult: Secondary | ICD-10-CM | POA: Diagnosis not present

## 2022-06-11 DIAGNOSIS — M1711 Unilateral primary osteoarthritis, right knee: Secondary | ICD-10-CM | POA: Diagnosis not present

## 2022-06-11 DIAGNOSIS — M1712 Unilateral primary osteoarthritis, left knee: Secondary | ICD-10-CM

## 2022-06-11 MED ORDER — BUPIVACAINE HCL 0.5 % IJ SOLN
2.0000 mL | INTRAMUSCULAR | Status: AC | PRN
  Administered 2022-06-11: 2 mL via INTRA_ARTICULAR

## 2022-06-11 MED ORDER — METHYLPREDNISOLONE ACETATE 40 MG/ML IJ SUSP
40.0000 mg | INTRAMUSCULAR | Status: AC | PRN
  Administered 2022-06-11: 40 mg via INTRA_ARTICULAR

## 2022-06-11 MED ORDER — LIDOCAINE HCL 1 % IJ SOLN
2.0000 mL | INTRAMUSCULAR | Status: AC | PRN
  Administered 2022-06-11: 2 mL

## 2022-06-11 NOTE — Progress Notes (Signed)
Office Visit Note   Patient: April [name]           Date of Birth: Aug 22, 1970           MRN: [id number] Visit Date: 06/11/2022              Requested by: [name], MD [place],  [place] [id number] PCP: [name], MD   Assessment & Plan: Visit Diagnoses:  1. Primary osteoarthritis of right knee   2. Body mass index 50.0-59.9, adult ([place])   3. Primary osteoarthritis of left knee     Plan: Impression is advanced degenerative joint disease bilateral knees.  Today, we discussed various treatment options to include repeat cortisone injection versus total knee arthroplasty.  Unfortunately, due to her weight she is not a candidate for surgery.  She would however like to repeat cortisone injections today.  She will continue to work at weight loss.  Follow-up as needed.  Follow-Up Instructions: Return if symptoms worsen or fail to improve.   Orders:  Orders Placed This Encounter  Procedures   XR KNEE 3 VIEW RIGHT   XR KNEE 3 VIEW LEFT   No orders of the defined types were placed in this encounter.     Procedures: Large Joint Inj: bilateral knee on 06/11/2022 7:35 PM Indications: pain Details: 22 G needle  Arthrogram: No  Medications (Right): 2 mL lidocaine 1 %; 2 mL bupivacaine 0.5 %; 40 mg methylPREDNISolone acetate 40 MG/ML Medications (Left): 2 mL lidocaine 1 %; 2 mL bupivacaine 0.5 %; 40 mg methylPREDNISolone acetate 40 MG/ML Outcome: tolerated well, no immediate complications Patient was prepped and draped in the usual sterile fashion.       Clinical Data: No additional findings.   Subjective: Chief Complaint  Patient presents with   Right Knee - Pain    HPI patient is a pleasant 52 year old female who comes in today with bilateral knee pain right greater than left.  History of osteoarthritis to both knees.  She was seen by [place] about a year ago where both knees were injected with cortisone.  She had good relief until recently.   The pain she has is to the entire aspect of both knees which is worse with walking.  She also has pain that wakes her from her sleep.  She is on chronic Percocet for total body arthritis.  Review of Systems as detailed in HPI.  All other reviewed and are negative.   Objective: Vital Signs: There were no vitals taken for this visit.  Physical Exam well-developed well-nourished female no acute distress.  Alert and oriented x 3. Ortho Exam bilateral knee exam shows small effusion.  Valgus deformity.  Range of motion 0 to 90 degrees.  Medial joint line tenderness.  She is neurovascular intact distally.  Specialty Comments:  No specialty comments available.  Imaging: XR KNEE 3 VIEW LEFT  Result Date: 06/11/2022 X-rays demonstrate significant tricompartmental degenerative changes with valgus deformity  XR KNEE 3 VIEW RIGHT  Result Date: 06/11/2022 X-rays demonstrate significant tricompartmental degenerative changes with valgus deformity    PMFS History: Patient Active Problem List   Diagnosis Date Noted   Primary osteoarthritis of left knee 06/11/2022   Asthma with COPD with exacerbation ([place]) 04/28/2022   Chronic pain syndrome    Hypothyroidism 08/23/2021   Chronic heart failure with preserved ejection fraction (HFpEF) ([place]) 08/23/2021   Primary osteoarthritis of right knee 07/04/2021   Body mass index 50.0-59.9, adult ([place]) 07/04/2021  Painful orthopaedic hardware ([place]) 07/04/2021   Severe persistent asthma 07/04/2020   Acute respiratory failure ([place]) 02/20/2019   CHF exacerbation ([place]) 02/20/2019   Hot flashes    Asthma-COPD overlap syndrome 10/26/2018   Acute respiratory distress 08/03/2018   Vitamin D deficiency 05/26/2018   Urinary incontinence 11/17/2017   Nausea with vomiting 11/06/2017   Hypomagnesemia 10/27/2017   Dysphagia 10/27/2017   Physical deconditioning 10/25/2017   Acute maxillary sinusitis 10/24/2017   Emesis, persistent 10/08/2017   Volume depletion  10/08/2017   Chest pain 10/08/2017   SOB (shortness of breath) 10/08/2017   Debility 09/22/2017   AKI (acute kidney injury) ([place]) 08/18/2017   HCAP (healthcare-associated pneumonia) 08/06/2017   Chronic atrial fibrillation 07/29/2017   AF (paroxysmal atrial fibrillation) (HCC)    Dyspnea 123456   Metabolic syndrome 123456   Moderate persistent asthma with exacerbation 10/19/2016   Hypochromic anemia 10/02/2016   Elevated hemoglobin A1c 05/05/2016   GERD (gastroesophageal reflux disease) 08/30/2015   Anxiety and depression 08/30/2015   Generalized anxiety disorder 08/30/2015   Hyperlipidemia 12/08/2013   Seasonal allergic rhinitis 08/29/2013   Leukocytosis 10/21/2012   Tobacco abuse in remission 10/07/2012   COPD mixed type ([place]) 05/07/2012   Knee pain, bilateral 04/25/2011   Insomnia 03/14/2011   Obstructive sleep apnea 12/19/2010   Hypokalemia 08/13/2010   Cervical back pain with evidence of disc disease 04/08/2008   HTN (hypertension) 07/31/2006   Morbid obesity due to excess calories ([place]) 06/17/2006   Major depressive disorder, recurrent episode ([place]) 04/10/2006   Past Medical History:  Diagnosis Date   Acanthosis nigricans    Anxiety    Arthritis    "knees" (04/28/2017)   Asthma    Followed by Dr. Annamaria Boots (pulmonology); receives every other week omalizumab injections; has frequent exacerbations   Back pain    Chronic diastolic CHF (congestive heart failure) ([place]) 01/17/2017   COPD (chronic obstructive pulmonary disease) ([place])    PFTs in 2002, FEV1/FVC 65, no post bronchodilater test done   Depression    GERD (gastroesophageal reflux disease)    Headache(784.0)    "q couple days" (123456)   Helicobacter pylori (H. pylori) infection    Hypertension, essential    Insomnia    Joint pain    Lower extremity edema    Menorrhagia    Morbid obesity ([place])    OSA on CPAP    Sleep study 2008 - mild OSA, not enough events to titrate CPAP; wears CPAP now/pt on  04/28/2017   Pneumonia X 1   Prediabetes    Rheumatoid arthritis (HCC)    Seasonal allergies    Shortness of breath    Tobacco user    Vitamin D deficiency     Family History  Problem Relation Age of Onset   Hypertension Mother    Asthma Daughter    Cancer Paternal Aunt    Asthma Maternal Grandmother     Past Surgical History:  Procedure Laterality Date   CARDIOVERSION N/A 05/30/2017   Procedure: CARDIOVERSION;  Surgeon: Sanda Klein, MD;  Location: [place];  Service: Cardiovascular;  Laterality: N/A;   REDUCTION MAMMAPLASTY Bilateral 09/2011   TUBAL LIGATION  1996   bilateral   Social History   Occupational History   Occupation: stay at home  Tobacco Use   Smoking status: Former    Packs/day: 0.50    Years: 26.00    Total pack years: 13.00    Types: Cigarettes    Quit date: 09/12/2014  Years since quitting: 7.7   Smokeless tobacco: Never   Tobacco comments:    Has vaped before, caught in the hospital  Vaping Use   Vaping Use: Never used  Substance and Sexual Activity   Alcohol use: No   Drug use: No   Sexual activity: Never

## 2022-06-13 ENCOUNTER — Encounter: Payer: Self-pay | Admitting: Internal Medicine

## 2022-06-14 ENCOUNTER — Telehealth: Payer: Self-pay | Admitting: Internal Medicine

## 2022-06-14 ENCOUNTER — Other Ambulatory Visit: Payer: Self-pay

## 2022-06-14 NOTE — Telephone Encounter (Signed)
Patient states she received Klonopin prescription and it states for her to take 1 daily when she had been taking 2. Patient is wondering if you had changed her prescription. Dr. Annamaria Boots can you please advise

## 2022-06-17 MED ORDER — CLONAZEPAM 0.5 MG PO TABS: ORAL_TABLET | ORAL | 5 refills | Status: DC

## 2022-06-17 NOTE — Telephone Encounter (Signed)
Clonazepam refilled for twice daily as needed

## 2022-06-17 NOTE — Telephone Encounter (Signed)
Spoke with patient she advises she is out of her clonazepam prescription. Dr. Annamaria Boots can you please advise?

## 2022-06-17 NOTE — Addendum Note (Signed)
Addended by: Baird Lyons D on: 06/17/2022 01:33 PM   Modules accepted: Orders

## 2022-06-17 NOTE — Telephone Encounter (Signed)
ATC X1 left detailed voicemail in regards to clonazepam being refilled. Advised patient to call back with any questions. Will close encounter

## 2022-06-17 NOTE — Telephone Encounter (Signed)
If she can get by with one tab clonazepam daily, that would be best. If she finds she needs to take it twice daily, then use up her current supply that way and let us know when she runs out, to refill for 2 tabs daily.

## 2022-06-18 ENCOUNTER — Other Ambulatory Visit (HOSPITAL_COMMUNITY): Payer: Self-pay

## 2022-06-18 ENCOUNTER — Ambulatory Visit: Payer: Medicaid Other

## 2022-06-18 MED ORDER — TEZEPELUMAB-EKKO 210 MG/1.91ML ~~LOC~~ SOSY
210.0000 mg | PREFILLED_SYRINGE | Freq: Once | SUBCUTANEOUS | Status: DC
  Filled 2022-06-18: qty 1.91

## 2022-06-19 ENCOUNTER — Encounter: Payer: Self-pay | Admitting: Internal Medicine

## 2022-06-20 ENCOUNTER — Ambulatory Visit (INDEPENDENT_AMBULATORY_CARE_PROVIDER_SITE_OTHER): Payer: Self-pay | Admitting: Licensed Clinical Social Worker

## 2022-06-20 DIAGNOSIS — Z91199 Patient's noncompliance with other medical treatment and regimen due to unspecified reason: Secondary | ICD-10-CM

## 2022-06-20 MED ORDER — TEZEPELUMAB-EKKO 210 MG/1.91ML ~~LOC~~ SOSY
210.0000 mg | PREFILLED_SYRINGE | Freq: Once | SUBCUTANEOUS | Status: DC
  Filled 2022-06-20: qty 1.91

## 2022-06-20 NOTE — Progress Notes (Signed)
LCSW counselor attempted to connect with patient for scheduled in office appointment 2:00pm.   Pt did not show for the appointment--LCSW clinician attempted to reach pt by phone unsuccessfully.  LMVM.    Per [facility] policy, after multiple attempts to reach pt unsuccessfully at appointed time--visit will be coded as no show

## 2022-06-21 ENCOUNTER — Other Ambulatory Visit (HOSPITAL_COMMUNITY): Payer: Self-pay

## 2022-06-24 ENCOUNTER — Other Ambulatory Visit (HOSPITAL_COMMUNITY): Payer: Self-pay

## 2022-06-24 ENCOUNTER — Other Ambulatory Visit: Payer: Self-pay

## 2022-06-24 NOTE — Progress Notes (Signed)
This encounter was created in error - please disregard.  This encounter was created in error - please disregard.

## 2022-06-28 ENCOUNTER — Ambulatory Visit (INDEPENDENT_AMBULATORY_CARE_PROVIDER_SITE_OTHER): Payer: 59

## 2022-06-28 VITALS — BP 112/65 | HR 58 | Temp 98.0°F | Resp 20 | Ht 60.0 in | Wt 311.0 lb

## 2022-06-28 DIAGNOSIS — J4552 Severe persistent asthma with status asthmaticus: Secondary | ICD-10-CM

## 2022-06-28 DIAGNOSIS — J4489 Other specified chronic obstructive pulmonary disease: Secondary | ICD-10-CM

## 2022-06-28 MED ORDER — TEZEPELUMAB-EKKO 210 MG/1.91ML ~~LOC~~ SOSY
210.0000 mg | PREFILLED_SYRINGE | Freq: Once | SUBCUTANEOUS | Status: AC
  Administered 2022-06-28: 210 mg via SUBCUTANEOUS
  Filled 2022-06-28: qty 1.91

## 2022-06-28 NOTE — Progress Notes (Signed)
Diagnosis: Asthma  Provider:  Marshell Garfinkel MD  Procedure: Injection  Tezspire (Tezepelumab), Dose: 210 mg, Site: subcutaneous, Number of injections: 1  Post Care:     Discharge: Condition: Good, Destination: Home . AVS Provided and AVS Declined  Performed by:  Arnoldo Morale, RN

## 2022-07-01 ENCOUNTER — Encounter (HOSPITAL_COMMUNITY): Payer: Self-pay

## 2022-07-01 ENCOUNTER — Emergency Department (HOSPITAL_COMMUNITY): Payer: 59

## 2022-07-01 ENCOUNTER — Other Ambulatory Visit: Payer: Self-pay

## 2022-07-01 ENCOUNTER — Emergency Department (HOSPITAL_COMMUNITY)
Admission: EM | Admit: 2022-07-01 | Discharge: 2022-07-01 | Disposition: A | Discharge status: Home / Self Care | Payer: 59 | Attending: Emergency Medicine | Admitting: Emergency Medicine

## 2022-07-01 DIAGNOSIS — S0990XA Unspecified injury of head, initial encounter: Secondary | ICD-10-CM | POA: Diagnosis present

## 2022-07-01 DIAGNOSIS — Z1152 Encounter for screening for COVID-19: Secondary | ICD-10-CM | POA: Insufficient documentation

## 2022-07-01 DIAGNOSIS — J4541 Moderate persistent asthma with (acute) exacerbation: Secondary | ICD-10-CM | POA: Diagnosis not present

## 2022-07-01 DIAGNOSIS — W01198A Fall on same level from slipping, tripping and stumbling with subsequent striking against other object, initial encounter: Secondary | ICD-10-CM | POA: Diagnosis not present

## 2022-07-01 DIAGNOSIS — Z7951 Long term (current) use of inhaled steroids: Secondary | ICD-10-CM | POA: Insufficient documentation

## 2022-07-01 DIAGNOSIS — D72829 Elevated white blood cell count, unspecified: Secondary | ICD-10-CM | POA: Insufficient documentation

## 2022-07-01 DIAGNOSIS — J449 Chronic obstructive pulmonary disease, unspecified: Secondary | ICD-10-CM | POA: Diagnosis not present

## 2022-07-01 DIAGNOSIS — Z79899 Other long term (current) drug therapy: Secondary | ICD-10-CM | POA: Diagnosis not present

## 2022-07-01 DIAGNOSIS — Z7901 Long term (current) use of anticoagulants: Secondary | ICD-10-CM | POA: Diagnosis not present

## 2022-07-01 LAB — RESP PANEL BY RT-PCR (RSV, FLU A&B, COVID)  RVPGX2
Influenza A by PCR: NEGATIVE
Influenza B by PCR: NEGATIVE
Resp Syncytial Virus by PCR: NEGATIVE
SARS Coronavirus 2 by RT PCR: NEGATIVE

## 2022-07-01 LAB — CBC WITH DIFFERENTIAL/PLATELET
Abs Immature Granulocytes: 0.08 10*3/uL — ABNORMAL HIGH (ref 0.00–0.07)
Basophils Absolute: 0.1 10*3/uL (ref 0.0–0.1)
Basophils Relative: 0 %
Eosinophils Absolute: 0.1 10*3/uL (ref 0.0–0.5)
Eosinophils Relative: 1 %
HCT: 35.8 % — ABNORMAL LOW (ref 36.0–46.0)
Hemoglobin: 11.2 g/dL — ABNORMAL LOW (ref 12.0–15.0)
Immature Granulocytes: 1 %
Lymphocytes Relative: 37 %
Lymphs Abs: 6.3 10*3/uL — ABNORMAL HIGH (ref 0.7–4.0)
MCH: 26.2 pg (ref 26.0–34.0)
MCHC: 31.3 g/dL (ref 30.0–36.0)
MCV: 83.6 fL (ref 80.0–100.0)
Monocytes Absolute: 1.9 10*3/uL — ABNORMAL HIGH (ref 0.1–1.0)
Monocytes Relative: 11 %
Neutro Abs: 8.7 10*3/uL — ABNORMAL HIGH (ref 1.7–7.7)
Neutrophils Relative %: 50 %
Platelets: 454 10*3/uL — ABNORMAL HIGH (ref 150–400)
RBC: 4.28 MIL/uL (ref 3.87–5.11)
RDW: 18 % — ABNORMAL HIGH (ref 11.5–15.5)
WBC: 17.2 10*3/uL — ABNORMAL HIGH (ref 4.0–10.5)
nRBC: 0 % (ref 0.0–0.2)

## 2022-07-01 LAB — I-STAT BETA HCG BLOOD, ED (MC, WL, AP ONLY): I-stat hCG, quantitative: <5 m[IU]/mL (ref <5)

## 2022-07-01 LAB — BASIC METABOLIC PANEL
Anion gap: 9 (ref 5–15)
BUN: 12 mg/dL (ref 6–20)
CO2: 21 mmol/L — ABNORMAL LOW (ref 22–32)
Calcium: 8.7 mg/dL — ABNORMAL LOW (ref 8.9–10.3)
Chloride: 106 mmol/L (ref 98–111)
Creatinine, Ser: 0.86 mg/dL (ref 0.44–1.00)
GFR, Estimated: >60 mL/min (ref >60)
Glucose, Bld: 96 mg/dL (ref 70–99)
Potassium: 3.5 mmol/L (ref 3.5–5.1)
Sodium: 136 mmol/L (ref 135–145)

## 2022-07-01 MED ORDER — PREDNISONE 20 MG PO TABS: ORAL_TABLET | ORAL | 0 refills | Status: DC

## 2022-07-01 MED ORDER — MAGNESIUM SULFATE 2 GM/50ML IV SOLN
2.0000 g | Freq: Once | INTRAVENOUS | Status: AC
  Administered 2022-07-01: 2 g via INTRAVENOUS
  Filled 2022-07-01: qty 50

## 2022-07-01 MED ORDER — ALBUTEROL SULFATE (2.5 MG/3ML) 0.083% IN NEBU
10.0000 mg | INHALATION_SOLUTION | Freq: Once | RESPIRATORY_TRACT | Status: AC
  Administered 2022-07-01: 10 mg via RESPIRATORY_TRACT
  Filled 2022-07-01: qty 12

## 2022-07-01 MED ORDER — ALBUTEROL SULFATE (2.5 MG/3ML) 0.083% IN NEBU: 10.0000 mg | INHALATION_SOLUTION | RESPIRATORY_TRACT | Status: DC

## 2022-07-01 MED ORDER — METHYLPREDNISOLONE SODIUM SUCC 125 MG IJ SOLR
125.0000 mg | Freq: Once | INTRAMUSCULAR | Status: AC
  Administered 2022-07-01: 125 mg via INTRAVENOUS
  Filled 2022-07-01: qty 2

## 2022-07-01 NOTE — ED Provider Notes (Signed)
[facility] Provider Note   CSN: [id number] Arrival date & time: 07/01/22  1945     History  Chief Complaint  Patient presents with   Shortness of Breath    April [name] is a 52 y.o. female.  52 yo  F with a chief complaints of being hit in the head.  She said that there was some plaster in the ceiling that it broke loose and struck her.  That was why EMS was initially called out.  When they got there she was having quite a bit of difficulty breathing.  Actually worsened and improved.  Patient started describing some chest discomfort as well.  She feels like she has felt this coming on for the past few days.  She has a history of COPD and sees the pulmonologist.   Shortness of Breath      Home Medications Prior to Admission medications   Medication Sig Start Date End Date Taking? Authorizing Provider  predniSONE (DELTASONE) 20 MG tablet 2 tabs po daily x 4 days 07/01/22  Yes Deno Etienne, DO  albuterol [facility] HFA) 108 (90 Base) MCG/ACT inhaler Inhale 2 puffs into the lungs every 6 (six) hours as needed for wheezing or shortness of breath. Patient taking differently: Inhale 2 puffs into the lungs as needed for wheezing or shortness of breath. 11/13/21   Baird Lyons D, MD  amLODipine (NORVASC) 10 MG tablet Take 1 tablet (10 mg total) by mouth daily. 03/11/22   [name], MD  apixaban (ELIQUIS) 5 MG TABS tablet Take 1 tablet (5 mg total) by mouth 2 (two) times daily. 11/28/21   [name], MD  baclofen (LIORESAL) 20 MG tablet Take 20-40 mg by mouth 3 (three) times daily as needed for muscle spasms. 04/25/22   [provider]  carvedilol (COREG) 6.25 MG tablet TAKE 1 TABLET(6.25 MG) BY MOUTH TWICE DAILY WITH A MEAL Patient taking differently: Take 6.25 mg by mouth daily. 03/18/22   [name], MD  clonazePAM (KLONOPIN) 0.5 MG tablet 1 twice daily as needed for anxiety 06/17/22   Baird Lyons D, MD  cloNIDine  (CATAPRES) 0.2 MG tablet Take 1 tablet (0.2 mg total) by mouth 2 (two) times daily. 03/11/22   [name], MD  EPINEPHrine 0.3 mg/0.3 mL IJ SOAJ injection Inject 0.3 mg into the muscle as needed for anaphylaxis. 05/30/20   Deneise Lever, MD  famotidine (PEPCID) 40 MG tablet Take 40 mg by mouth daily. 01/25/22   [provider]  FLUoxetine (PROZAC) 20 MG capsule Take 1 capsule (20 mg total) by mouth daily. Take 1 capsule daily. 04/09/22   Armando Reichert, MD  fluticasone (FLONASE) 50 MCG/ACT nasal spray SHAKE LIQUID AND USE 1 SPRAY IN EACH NOSTRIL DAILY AS NEEDED FOR ALLERGIES OR RHINITIS Patient taking differently: Place 1 spray into both nostrils as needed for allergies. 08/05/21   [name], MD  Fluticasone-Umeclidin-Vilant (TRELEGY ELLIPTA) 200-62.5-25 MCG/ACT AEPB Inhale 1 puff into the lungs daily. 05/15/22   Martyn Ehrich, NP  furosemide (LASIX) 20 MG tablet TAKE 1 TABLET(20 MG) BY MOUTH DAILY Patient taking differently: Take 20 mg by mouth daily. 04/12/22   [name], MD  ipratropium-albuterol (DUONEB) 0.5-2.5 (3) MG/3ML SOLN 1 neb every 6 hours if needed for asthma Patient taking differently: Take 3 mLs by nebulization every 4 (four) hours as needed (shortness of breath). 01/28/22   Deneise Lever, MD  levothyroxine (SYNTHROID) 50 MCG tablet Take 1 tablet (50  mcg total) by mouth daily. 11/28/21   [name], MD  montelukast (SINGULAIR) 10 MG tablet Take 1 tablet (10 mg total) by mouth at bedtime. 05/02/22   Oswald Hillock, MD  naloxone [facility]) nasal spray 4 mg/0.1 mL Place 1 spray into the nose once as needed (opioid overdose). 08/09/20   [provider]  oxyCODONE-acetaminophen (PERCOCET) 10-325 MG tablet Take 1 tablet by mouth 5 (five) times daily as needed for pain. 03/26/22     phentermine 15 MG capsule Take 15 mg by mouth daily. 08/10/21   [provider]  potassium chloride SA (KLOR-CON M) 20 MEQ tablet TAKE 1 TABLET(20 MEQ) BY MOUTH  DAILY Patient taking differently: Take 20 mEq by mouth daily. 02/01/22   [name], MD  pregabalin (LYRICA) 300 MG capsule Take 300 mg by mouth in the morning, at noon, and at bedtime. 01/08/21   [provider]  Semaglutide, 1 MG/DOSE, (OZEMPIC, 1 MG/DOSE,) 4 MG/3ML SOPN Inject 1 mg into the skin once a week.    [provider]  Tezepelumab-ekko (TEZSPIRE) 210 MG/1.91ML SOAJ Inject 210 mg into the skin every 28 (twenty-eight) days. 05/17/22   Baird Lyons D, MD  topiramate (TOPAMAX) 200 MG tablet Take 200 mg by mouth at bedtime. 01/08/21   [provider]  Vitamin D, Ergocalciferol, (DRISDOL) 1.25 MG (50000 UNIT) CAPS capsule Take 50,000 Units by mouth once a week. 11/30/21   [provider]  zolpidem (AMBIEN) 10 MG tablet TAKE 1 TABLET BY MOUTH AT BEDTIME AS NEEDED FOR SLEEP Patient taking differently: Take 10 mg by mouth at bedtime. 01/28/22   Young, Kasandra Knudsen, MD  mometasone-formoterol (DULERA) 200-5 MCG/ACT AERO Inhale 2 puffs into the lungs 2 (two) times daily. Dx: Asthma 04/08/19 08/12/19  Deneise Lever, MD      Allergies    Contrast media [iodinated contrast media]    Review of Systems   Review of Systems  Respiratory:  Positive for shortness of breath.     Physical Exam Updated Vital Signs BP 125/60   Pulse 90   Temp 98.2 F (36.8 C)   Resp 20   LMP 06/12/2022 (Approximate)   SpO2 100%  Physical Exam Vitals and nursing note reviewed.  Constitutional:      General: She is not in acute distress.    Appearance: She is well-developed. She is not diaphoretic.  HENT:     Head: Normocephalic and atraumatic.  Eyes:     Pupils: Pupils are equal, round, and reactive to light.  Cardiovascular:     Rate and Rhythm: Normal rate and regular rhythm.     Heart sounds: No murmur heard.    No friction rub. No gallop.  Pulmonary:     Effort: Pulmonary effort is normal.     Breath sounds: No wheezing or rales.     Comments: Tachypnea, prolonged  expiratory effort.  Wheezes diffusely. Abdominal:     General: There is no distension.     Palpations: Abdomen is soft.     Tenderness: There is no abdominal tenderness.  Musculoskeletal:        General: No tenderness.     Cervical back: Normal range of motion and neck supple.  Skin:    General: Skin is warm and dry.  Neurological:     Mental Status: She is alert and oriented to person, place, and time.  Psychiatric:        Behavior: Behavior normal.     ED Results / Procedures /  Treatments   Labs (all labs ordered are listed, but only abnormal results are displayed) Labs Reviewed  CBC WITH DIFFERENTIAL/PLATELET - Abnormal; Notable for the following components:      Result Value   WBC 17.2 (*)    Hemoglobin 11.2 (*)    HCT 35.8 (*)    RDW 18.0 (*)    Platelets 454 (*)    Neutro Abs 8.7 (*)    Lymphs Abs 6.3 (*)    Monocytes Absolute 1.9 (*)    Abs Immature Granulocytes 0.08 (*)    All other components within normal limits  BASIC METABOLIC PANEL - Abnormal; Notable for the following components:   CO2 21 (*)    Calcium 8.7 (*)    All other components within normal limits  RESP PANEL BY RT-PCR (RSV, FLU A&B, COVID)  [id number]  I-STAT BETA HCG BLOOD, ED ([facility], [facility], [facility] ONLY)    EKG EKG Interpretation  Date/Time:  Monday July 01 2022 20:02:38 EST Ventricular Rate:  96 PR Interval:  145 QRS Duration: 88 QT Interval:  342 QTC Calculation: 433 R Axis:   57 Text Interpretation: Sinus rhythm No significant change since last tracing Confirmed by Deno Etienne [phone]) on 07/01/2022 8:05:00 PM  Radiology CT Head Wo Contrast  Result Date: 07/01/2022 CLINICAL DATA:  Blunt trauma to the head with headaches, initial encounter EXAM: CT HEAD WITHOUT CONTRAST TECHNIQUE: Contiguous axial images were obtained from the base of the skull through the vertex without intravenous contrast. RADIATION DOSE REDUCTION: This exam was performed according to the departmental dose-optimization program which  includes automated exposure control, adjustment of the mA and/or kV according to patient size and/or use of iterative reconstruction technique. COMPARISON:  12/18/2016 FINDINGS: Brain: No evidence of acute infarction, hemorrhage, hydrocephalus, extra-axial collection or mass lesion/mass effect. Vascular: No hyperdense vessel or unexpected calcification. Skull: Normal. Negative for fracture or focal lesion. Sinuses/Orbits: No acute finding. Other: None. IMPRESSION: No acute intracranial abnormality noted. Electronically Signed   By: Inez Catalina M.D.   On: 07/01/2022 23:16   DG Chest Port 1 View  Result Date: 07/01/2022 CLINICAL DATA:  Shortness of breath EXAM: PORTABLE CHEST 1 VIEW COMPARISON:  05/01/2022 FINDINGS: Heart and mediastinal contours are within normal limits. No focal opacities or effusions. No acute bony abnormality. IMPRESSION: No active disease. Electronically Signed   By: Rolm Baptise M.D.   On: 07/01/2022 20:11    Procedures Procedures    Medications Ordered in ED Medications  methylPREDNISolone sodium succinate (SOLU-MEDROL) 125 mg/2 mL injection 125 mg (125 mg Intravenous Given 07/01/22 2031)  magnesium sulfate IVPB 2 g 50 mL (0 g Intravenous Stopped 07/01/22 2125)  albuterol (PROVENTIL) (2.5 MG/3ML) 0.083% nebulizer solution 10 mg (10 mg Nebulization Given 07/01/22 2030)    ED Course/ Medical Decision Making/ A&P                             Medical Decision Making Amount and/or Complexity of Data Reviewed Labs: ordered. Radiology: ordered.  Risk Prescription drug management.   52 yo F with a chief complaints of difficulty breathing cough going on for a couple days.  She also is concerned that she gets struck in the head by a piece of plaster that had fallen loose.  The patient is on blood thinners, she however has no obvious signs of head injury.  No confusion no vomiting.    CT of the head negative for acute intracranial pathology.  Mild leukocytosis.  No significant  electrolyte abnormality.  Patient with significant improvements of her breathing on repeat assessment.  She was able to ambulate to the bathroom and back without any significant tachypnea or hypoxia.  Will discharge home.  PCP follow-up.  11:21 PM:  I have discussed the diagnosis/risks/treatment options with the patient.  Evaluation and diagnostic testing in the emergency department does not suggest an emergent condition requiring admission or immediate intervention beyond what has been performed at this time.  They will follow up with PCP. We also discussed returning to the ED immediately if new or worsening sx occur. We discussed the sx which are most concerning (e.g., sudden worsening pain, fever, inability to tolerate by mouth, need to use inhaler more often than every 4 hours) that necessitate immediate return. Medications administered to the patient during their visit and any new prescriptions provided to the patient are listed below.  Medications given during this visit Medications  methylPREDNISolone sodium succinate (SOLU-MEDROL) 125 mg/2 mL injection 125 mg (125 mg Intravenous Given 07/01/22 2031)  magnesium sulfate IVPB 2 g 50 mL (0 g Intravenous Stopped 07/01/22 2125)  albuterol (PROVENTIL) (2.5 MG/3ML) 0.083% nebulizer solution 10 mg (10 mg Nebulization Given 07/01/22 2030)     The patient appears reasonably screen and/or stabilized for discharge and I doubt any other medical condition or other [facility] requiring further screening, evaluation, or treatment in the ED at this time prior to discharge.          Final Clinical Impression(s) / ED Diagnoses Final diagnoses:  Moderate persistent asthma with exacerbation    Rx / DC Orders ED Discharge Orders          Ordered    predniSONE (DELTASONE) 20 MG tablet        07/01/22 [place], [place], DO 07/01/22 2321

## 2022-07-01 NOTE — ED Notes (Signed)
Pt refusing DC vital signs. Pt refusing to leave. MD notified. Pt ambulated up and down hallway on room air with SPO2 remaining above 97% and in no distress while ambulating.

## 2022-07-01 NOTE — ED Notes (Signed)
MD spoke with pt. MD discharging pt. Pt continuing to refuse DC vitals. Pt AAOx4. Pt in no acute distress. Pt ambulatory.

## 2022-07-01 NOTE — ED Triage Notes (Signed)
Pt arrived from home via [facility] c/o sob. Pt originally called out for plaster that fell from ceiling striking her on the head. When EMS arrived pt was sob, wheezing noted throughout.

## 2022-07-01 NOTE — Discharge Instructions (Signed)
Use your inhaler every 4 hours(6 puffs) while awake, return for sudden worsening shortness of breath, or if you need to use your inhaler more often.  ° °

## 2022-07-02 ENCOUNTER — Encounter (HOSPITAL_COMMUNITY): Payer: Self-pay | Admitting: *Deleted

## 2022-07-02 ENCOUNTER — Ambulatory Visit (HOSPITAL_COMMUNITY)
Admission: EM | Admit: 2022-07-02 | Discharge: 2022-07-02 | Disposition: A | Discharge status: Home / Self Care | Payer: 59 | Attending: Family Medicine | Admitting: Family Medicine

## 2022-07-02 ENCOUNTER — Ambulatory Visit: Payer: Self-pay | Admitting: *Deleted

## 2022-07-02 ENCOUNTER — Encounter: Payer: Self-pay | Admitting: Internal Medicine

## 2022-07-02 ENCOUNTER — Other Ambulatory Visit: Payer: Self-pay

## 2022-07-02 DIAGNOSIS — J4551 Severe persistent asthma with (acute) exacerbation: Secondary | ICD-10-CM

## 2022-07-02 DIAGNOSIS — S0990XA Unspecified injury of head, initial encounter: Secondary | ICD-10-CM

## 2022-07-02 MED ORDER — METHYLPREDNISOLONE ACETATE 80 MG/ML IJ SUSP
80.0000 mg | Freq: Once | INTRAMUSCULAR | Status: AC
  Administered 2022-07-02: 80 mg via INTRAMUSCULAR

## 2022-07-02 MED ORDER — IPRATROPIUM-ALBUTEROL 0.5-2.5 (3) MG/3ML IN SOLN
RESPIRATORY_TRACT | Status: AC
  Filled 2022-07-02: qty 3

## 2022-07-02 MED ORDER — IPRATROPIUM-ALBUTEROL 0.5-2.5 (3) MG/3ML IN SOLN
3.0000 mL | Freq: Once | RESPIRATORY_TRACT | Status: AC
  Administered 2022-07-02: 3 mL via RESPIRATORY_TRACT

## 2022-07-02 MED ORDER — METHYLPREDNISOLONE ACETATE 80 MG/ML IJ SUSP
INTRAMUSCULAR | Status: AC
  Filled 2022-07-02: qty 1

## 2022-07-02 NOTE — Telephone Encounter (Signed)
Schedule apt with PCP 07/09/2022 at 1030.  Patient aware.

## 2022-07-02 NOTE — Telephone Encounter (Signed)
Chief Complaint: chest pain and headache after ceiling fell on patient Symptoms: chest pain patient reports due to shortness of breath with exertion. No shortness of breath at rest. Headache now and reports blurred vision comes and goes. Dizziness with standing. Not able to sleep N/T feet and hands but denies either side of body.  Frequency: 07/01/22 Pertinent Negatives: Patient denies chest pain with sweating N/V, denies cardiac issues. No weakness on either side of body. No speech issues.  Disposition: '[x]'$ ED /'[]'$ Urgent Care (no appt availability in office) / '[]'$ Appointment(In office/virtual)/ '[]'$  [facility]/ '[]'$ Home Care/ '[x]'$ Refused Recommended Disposition /'[]'$ [facility]/ '[]'$  Follow-up with PCP Additional Notes:   Recommended ED due to sx. Refused and reports she has been seen in ED yesterday and UC today. Requesting neurological referral from [facility]. Please advise patient refusal to go back to ED.    Reason for Disposition  [1] Chest pain lasts < 5 minutes AND [2] NO chest pain or cardiac symptoms (e.g., breathing difficulty, sweating) now  (Exception: Chest pains that last only a few seconds.)  [1] SEVERE headache (e.g., excruciating) AND [2] not improved after 2 hours of pain medicine  Answer Assessment - Initial Assessment Questions 1. LOCATION: "Where does it hurt?"      Chest pain patient reports due to asthma 2. RADIATION: "Does the pain go anywhere else?" (e.g., into neck, jaw, arms, back)     na 3. ONSET: "When did the chest pain begin?" (Minutes, hours or days)      After ceiling fell on her 07/01/22 4. PATTERN: "Does the pain come and go, or has it been constant since it started?"  "Does it get worse with exertion?"      Comes and goes shortness of breath with exertion  5. DURATION: "How long does it last" (e.g., seconds, minutes, hours)     na 6. SEVERITY: "How bad is the pain?"  (e.g., Scale 1-10; mild, moderate, or severe)    - MILD (1-3): doesn't interfere  with normal activities     - MODERATE (4-7): interferes with normal activities or awakens from sleep    - SEVERE (8-10): excruciating pain, unable to do any normal activities      Na  7. CARDIAC RISK FACTORS: "Do you have any history of heart problems or risk factors for heart disease?" (e.g., angina, prior heart attack; diabetes, high blood pressure, high cholesterol, smoker, or strong family history of heart disease)     na 8. PULMONARY RISK FACTORS: "Do you have any history of lung disease?"  (e.g., blood clots in lung, asthma, emphysema, birth control pills)     Hx asthma  9. CAUSE: "What do you think is causing the chest pain?"     Asthma  10. OTHER SYMPTOMS: "Do you have any other symptoms?" (e.g., dizziness, nausea, vomiting, sweating, fever, difficulty breathing, cough)      Chest pain from asthma , shortness of breath with exertion. Headache blurred vision at times  11. PREGNANCY: "Is there any chance you are pregnant?" "When was your last menstrual period?"       na  Answer Assessment - Initial Assessment Questions 1. LOCATION: "Where does it hurt?"      head 2. ONSET: "When did the headache start?" (Minutes, hours or days)      After ceiling fell on her  3. PATTERN: "Does the pain come and go, or has it been constant since it started?"     Comes and goes has been seen  in ED and UC today per patient  4. SEVERITY: "How bad is the pain?" and "What does it keep you from doing?"  (e.g., Scale 1-10; mild, moderate, or severe)   - MILD (1-3): doesn't interfere with normal activities    - MODERATE (4-7): interferes with normal activities or awakens from sleep    - SEVERE (8-10): excruciating pain, unable to do any normal activities        Can do normal activities  5. RECURRENT SYMPTOM: "Have you ever had headaches before?" If Yes, ask: "When was the last time?" and "What happened that time?"      Na  6. CAUSE: "What do you think is causing the headache?"     Ceiling fell on head 7.  MIGRAINE: "Have you been diagnosed with migraine headaches?" If Yes, ask: "Is this headache similar?"      na 8. HEAD INJURY: "Has there been any recent injury to the head?"      Yes  9. OTHER SYMPTOMS: "Do you have any other symptoms?" (fever, stiff neck, eye pain, sore throat, cold symptoms)     Blurred vision 10. PREGNANCY: "Is there any chance you are pregnant?" "When was your last menstrual period?"       na  Protocols used: Chest Pain-A-AH, Headache-A-AH

## 2022-07-02 NOTE — Discharge Instructions (Addendum)
You have been given a duoneb breathing treatment.   Continue using your albuterol or duoneb as needed at home.  You have been given a shot of steroids-Depomedrol 80 mg. Also pick up the prescription of prednisone that was sent in by the ER and take that as rx'd.

## 2022-07-02 NOTE — ED Provider Notes (Signed)
[facility]    CSN: [id number] Arrival date & time: 07/02/22  1144      History   Chief Complaint Chief Complaint  Patient presents with   Head Injury    HPI April [name] is a 52 y.o. female.    Head Injury  Here with shortness of breath and wheezing.  Symptoms began overnight last night.  She was on the way to the ER in an ambulance about some head injury when she was given a breathing treatment.  It did help her some but then when she awoke today she was having trouble again breathing.  No fever or chills. Yesterday and her home sheet rock and wood fell from her ceiling and hit her in the head.  She states she did not lose consciousness but that she fell to the ground when this happened and did not get up for a while.  She was evaluated in the emergency room and CT head about 14 hours ago was negative for any intracranial bleed.  She does take Eliquis; she takes oxycodone 2 times daily plus pregabalin; eGFR yesterday was >60 . ER did prescribe prednisone for 4 days.  She is taking Trelegy daily; she is followed by pulmonology.  When seen in triage, she states that the top of her head is hurting worse than it was last night.  She does not have any double vision or blurry vision.  No other new neurologic symptoms.   Past Medical History:  Diagnosis Date   Acanthosis nigricans    Anxiety    Arthritis    "knees" (04/28/2017)   Asthma    Followed by Dr. Annamaria Boots (pulmonology); receives every other week omalizumab injections; has frequent exacerbations   Back pain    Chronic diastolic CHF (congestive heart failure) ([place]) 01/17/2017   COPD (chronic obstructive pulmonary disease) ([place])    PFTs in 2002, FEV1/FVC 65, no post bronchodilater test done   Depression    GERD (gastroesophageal reflux disease)    Headache(784.0)    "q couple days" (123456)   Helicobacter pylori (H. pylori) infection    Hypertension, essential    Insomnia    Joint pain    Lower extremity  edema    Menorrhagia    Morbid obesity ([place])    OSA on CPAP    Sleep study 2008 - mild OSA, not enough events to titrate CPAP; wears CPAP now/pt on 04/28/2017   Pneumonia X 1   Prediabetes    Rheumatoid arthritis (HCC)    Seasonal allergies    Shortness of breath    Tobacco user    Vitamin D deficiency     Patient Active Problem List   Diagnosis Date Noted   Primary osteoarthritis of left knee 06/11/2022   Asthma with COPD with exacerbation ([place]) 04/28/2022   Chronic pain syndrome    Hypothyroidism 08/23/2021   Chronic heart failure with preserved ejection fraction (HFpEF) ([place]) 08/23/2021   Primary osteoarthritis of right knee 07/04/2021   Body mass index 50.0-59.9, adult ([place]) 07/04/2021   Painful orthopaedic hardware ([place]) 07/04/2021   Severe persistent asthma 07/04/2020   Acute respiratory failure ([place]) 02/20/2019   CHF exacerbation ([place]) 02/20/2019   Hot flashes    Asthma-COPD overlap syndrome 10/26/2018   Acute respiratory distress 08/03/2018   Vitamin D deficiency 05/26/2018   Urinary incontinence 11/17/2017   Nausea with vomiting 11/06/2017   Hypomagnesemia 10/27/2017   Dysphagia 10/27/2017   Physical deconditioning 10/25/2017   Acute maxillary sinusitis  10/24/2017   Emesis, persistent 10/08/2017   Volume depletion 10/08/2017   Chest pain 10/08/2017   SOB (shortness of breath) 10/08/2017   Debility 09/22/2017   AKI (acute kidney injury) ([place]) 08/18/2017   HCAP (healthcare-associated pneumonia) 08/06/2017   Chronic atrial fibrillation 07/29/2017   AF (paroxysmal atrial fibrillation) (HCC)    Dyspnea 123456   Metabolic syndrome 123456   Moderate persistent asthma with exacerbation 10/19/2016   Hypochromic anemia 10/02/2016   Elevated hemoglobin A1c 05/05/2016   GERD (gastroesophageal reflux disease) 08/30/2015   Anxiety and depression 08/30/2015   Generalized anxiety disorder 08/30/2015   Hyperlipidemia 12/08/2013   Seasonal allergic rhinitis  08/29/2013   Leukocytosis 10/21/2012   Tobacco abuse in remission 10/07/2012   COPD mixed type ([place]) 05/07/2012   Knee pain, bilateral 04/25/2011   Insomnia 03/14/2011   Obstructive sleep apnea 12/19/2010   Hypokalemia 08/13/2010   Cervical back pain with evidence of disc disease 04/08/2008   HTN (hypertension) 07/31/2006   Morbid obesity due to excess calories ([place]) 06/17/2006   Major depressive disorder, recurrent episode ([place]) 04/10/2006    Past Surgical History:  Procedure Laterality Date   CARDIOVERSION N/A 05/30/2017   Procedure: CARDIOVERSION;  Surgeon: Sanda Klein, MD;  Location: [facility];  Service: Cardiovascular;  Laterality: N/A;   REDUCTION MAMMAPLASTY Bilateral 09/2011   TUBAL LIGATION  1996   bilateral    OB History     Gravida  4   Para  4   Term      Preterm      AB      Living         SAB      IAB      Ectopic      Multiple      Live Births               Home Medications    Prior to Admission medications   Medication Sig Start Date End Date Taking? Authorizing Provider  albuterol (PROAIR HFA) 108 (90 Base) MCG/ACT inhaler Inhale 2 puffs into the lungs every 6 (six) hours as needed for wheezing or shortness of breath. Patient taking differently: Inhale 2 puffs into the lungs as needed for wheezing or shortness of breath. 11/13/21   Baird Lyons D, MD  amLODipine (NORVASC) 10 MG tablet Take 1 tablet (10 mg total) by mouth daily. 03/11/22   [name], MD  apixaban (ELIQUIS) 5 MG TABS tablet Take 1 tablet (5 mg total) by mouth 2 (two) times daily. 11/28/21   [name], MD  baclofen (LIORESAL) 20 MG tablet Take 20-40 mg by mouth 3 (three) times daily as needed for muscle spasms. 04/25/22   [provider]  carvedilol (COREG) 6.25 MG tablet TAKE 1 TABLET(6.25 MG) BY MOUTH TWICE DAILY WITH A MEAL Patient taking differently: Take 6.25 mg by mouth daily. 03/18/22   [name], MD  clonazePAM (KLONOPIN) 0.5 MG  tablet 1 twice daily as needed for anxiety 06/17/22   Baird Lyons D, MD  cloNIDine (CATAPRES) 0.2 MG tablet Take 1 tablet (0.2 mg total) by mouth 2 (two) times daily. 03/11/22   [name], MD  EPINEPHrine 0.3 mg/0.3 mL IJ SOAJ injection Inject 0.3 mg into the muscle as needed for anaphylaxis. 05/30/20   Deneise Lever, MD  famotidine (PEPCID) 40 MG tablet Take 40 mg by mouth daily. 01/25/22   [provider]  FLUoxetine (PROZAC) 20 MG capsule Take 1 capsule (20 mg total) by mouth daily. Take  1 capsule daily. 04/09/22   Armando Reichert, MD  fluticasone (FLONASE) 50 MCG/ACT nasal spray SHAKE LIQUID AND USE 1 SPRAY IN EACH NOSTRIL DAILY AS NEEDED FOR ALLERGIES OR RHINITIS Patient taking differently: Place 1 spray into both nostrils as needed for allergies. 08/05/21   [name], MD  Fluticasone-Umeclidin-Vilant (TRELEGY ELLIPTA) 200-62.5-25 MCG/ACT AEPB Inhale 1 puff into the lungs daily. 05/15/22   Martyn Ehrich, NP  furosemide (LASIX) 20 MG tablet TAKE 1 TABLET(20 MG) BY MOUTH DAILY Patient taking differently: Take 20 mg by mouth daily. 04/12/22   [name], MD  ipratropium-albuterol (DUONEB) 0.5-2.5 (3) MG/3ML SOLN 1 neb every 6 hours if needed for asthma Patient taking differently: Take 3 mLs by nebulization every 4 (four) hours as needed (shortness of breath). 01/28/22   Deneise Lever, MD  levothyroxine (SYNTHROID) 50 MCG tablet Take 1 tablet (50 mcg total) by mouth daily. 11/28/21   [name], MD  montelukast (SINGULAIR) 10 MG tablet Take 1 tablet (10 mg total) by mouth at bedtime. 05/02/22   Oswald Hillock, MD  naloxone [facility]) nasal spray 4 mg/0.1 mL Place 1 spray into the nose once as needed (opioid overdose). 08/09/20   [provider]  oxyCODONE-acetaminophen (PERCOCET) 10-325 MG tablet Take 1 tablet by mouth 5 (five) times daily as needed for pain. 03/26/22     phentermine 15 MG capsule Take 15 mg by mouth daily. 08/10/21   [provider]   potassium chloride SA (KLOR-CON M) 20 MEQ tablet TAKE 1 TABLET(20 MEQ) BY MOUTH DAILY Patient taking differently: Take 20 mEq by mouth daily. 02/01/22   [name], MD  predniSONE (DELTASONE) 20 MG tablet 2 tabs po daily x 4 days 07/01/22   Deno Etienne, DO  pregabalin (LYRICA) 300 MG capsule Take 300 mg by mouth in the morning, at noon, and at bedtime. 01/08/21   [provider]  Semaglutide, 1 MG/DOSE, (OZEMPIC, 1 MG/DOSE,) 4 MG/3ML SOPN Inject 1 mg into the skin once a week.    [provider]  Tezepelumab-ekko (TEZSPIRE) 210 MG/1.91ML SOAJ Inject 210 mg into the skin every 28 (twenty-eight) days. 05/17/22   Baird Lyons D, MD  topiramate (TOPAMAX) 200 MG tablet Take 200 mg by mouth at bedtime. 01/08/21   [provider]  Vitamin D, Ergocalciferol, (DRISDOL) 1.25 MG (50000 UNIT) CAPS capsule Take 50,000 Units by mouth once a week. 11/30/21   [provider]  zolpidem (AMBIEN) 10 MG tablet TAKE 1 TABLET BY MOUTH AT BEDTIME AS NEEDED FOR SLEEP Patient taking differently: Take 10 mg by mouth at bedtime. 01/28/22   Young, Kasandra Knudsen, MD  mometasone-formoterol (DULERA) 200-5 MCG/ACT AERO Inhale 2 puffs into the lungs 2 (two) times daily. Dx: Asthma 04/08/19 08/12/19  Deneise Lever, MD    Family History Family History  Problem Relation Age of Onset   Hypertension Mother    Asthma Daughter    Cancer Paternal Aunt    Asthma Maternal Grandmother     Social History Social History   Tobacco Use   Smoking status: Former    Packs/day: 0.50    Years: 26.00    Total pack years: 13.00    Types: Cigarettes    Quit date: 09/12/2014    Years since quitting: 7.8   Smokeless tobacco: Never   Tobacco comments:    Has vaped before, caught in the hospital  Vaping Use   Vaping Use: Never used  Substance Use Topics   Alcohol use: No  Drug use: No     Allergies   Contrast media [iodinated contrast media]   Review of Systems Review of Systems   Physical  Exam Triage Vital Signs ED Triage Vitals  Enc Vitals Group     BP 07/02/22 1236 130/72     Pulse Rate 07/02/22 1236 77     Resp 07/02/22 1236 20     Temp 07/02/22 1236 98.4 F (36.9 C)     Temp src --      SpO2 07/02/22 1236 100 %     Weight --      Height --      Head Circumference --      Peak Flow --      Pain Score 07/02/22 1233 10     Pain Loc --      Pain Edu? --      Excl. in [place]? --    No data found.  Updated Vital Signs BP 130/72   Pulse 77   Temp 98.4 F (36.9 C)   Resp 20   LMP 06/12/2022 (Approximate)   SpO2 100%   Visual Acuity Right Eye Distance:   Left Eye Distance:   Bilateral Distance:    Right Eye Near:   Left Eye Near:    Bilateral Near:     Physical Exam Vitals reviewed.  Constitutional:      General: She is not in acute distress.    Appearance: She is not ill-appearing, toxic-appearing or diaphoretic.  HENT:     Mouth/Throat:     Mouth: Mucous membranes are moist.  Eyes:     Extraocular Movements: Extraocular movements intact.     Pupils: Pupils are equal, round, and reactive to light.  Cardiovascular:     Rate and Rhythm: Normal rate and regular rhythm.     Heart sounds: No murmur heard. Pulmonary:     Comments: There are bilateral expiratory wheezes with poor air movement on initial evaluation. Neurological:     Mental Status: She is alert.      UC Treatments / Results  Labs (all labs ordered are listed, but only abnormal results are displayed) Labs Reviewed - No data to display  EKG   Radiology CT Head Wo Contrast  Result Date: 07/01/2022 CLINICAL DATA:  Blunt trauma to the head with headaches, initial encounter EXAM: CT HEAD WITHOUT CONTRAST TECHNIQUE: Contiguous axial images were obtained from the base of the skull through the vertex without intravenous contrast. RADIATION DOSE REDUCTION: This exam was performed according to the departmental dose-optimization program which includes automated exposure control, adjustment  of the mA and/or kV according to patient size and/or use of iterative reconstruction technique. COMPARISON:  12/18/2016 FINDINGS: Brain: No evidence of acute infarction, hemorrhage, hydrocephalus, extra-axial collection or mass lesion/mass effect. Vascular: No hyperdense vessel or unexpected calcification. Skull: Normal. Negative for fracture or focal lesion. Sinuses/Orbits: No acute finding. Other: None. IMPRESSION: No acute intracranial abnormality noted. Electronically Signed   By: Inez Catalina M.D.   On: 07/01/2022 23:16   DG Chest Port 1 View  Result Date: 07/01/2022 CLINICAL DATA:  Shortness of breath EXAM: PORTABLE CHEST 1 VIEW COMPARISON:  05/01/2022 FINDINGS: Heart and mediastinal contours are within normal limits. No focal opacities or effusions. No acute bony abnormality. IMPRESSION: No active disease. Electronically Signed   By: Rolm Baptise M.D.   On: 07/01/2022 20:11    Procedures Procedures (including critical care time)  Medications Ordered in UC Medications  ipratropium-albuterol (DUONEB) 0.5-2.5 (3) MG/3ML nebulizer  solution 3 mL (3 mLs Nebulization Given 07/02/22 1246)  methylPREDNISolone acetate (DEPO-MEDROL) injection 80 mg (80 mg Intramuscular Given 07/02/22 1316)    Initial Impression / Assessment and Plan / UC Course  I have reviewed the triage vital signs and the nursing notes.  Pertinent labs & imaging results that were available during my care of the patient were reviewed by me and considered in my medical decision making (see chart for details).       DuoNeb was given initially.  She did improve though she was still wheezing audibly.  Of course she also improved when she sat up.  Air movement was much better.  She was then given a steroid injection. Exam is reassuring, and I cannot tell that she needs to go back for urgent evaluation with more advanced imaging at this time.  I know her head is hurting, but she is already taking narcotics regularly and she cannot take  NSAIDs due to her being on the Eliquis.  She requests "referral" to neurology.  She is given contact information for [facility], and have asked her to call her primary care in case she needs authorization for insurance for specialty appointment.   Then she states "you know, I did not come here for my asthma.  I came for my head."  The options for treatment for head that are limited with what she is already taking.  Also I discussed with her that she did improve with the nebulization treatment here, and that she should fill the prednisone prescription provided by the ER yesterday.   Final Clinical Impressions(s) / UC Diagnoses   Final diagnoses:  Closed head injury, initial encounter  Severe persistent asthma with acute exacerbation     Discharge Instructions      You have been given a duoneb breathing treatment.   Continue using your albuterol or duoneb as needed at home.  You have been given a shot of steroids-Depomedrol 80 mg. Also pick up the prescription of prednisone that was sent in by the ER and take that as rx'd.        ED Prescriptions   None    I have reviewed the PDMP during this encounter.   Barrett Henle, MD 07/02/22 [phone]

## 2022-07-02 NOTE — ED Triage Notes (Signed)
Pt was seen last night in ED after the ceiling fell on her . Pt takes Elequis . Pt continues to have head ache after she was  seen in ED.  Pt reports asthma flare up since she has been home.

## 2022-07-05 ENCOUNTER — Telehealth: Payer: Self-pay | Admitting: Emergency Medicine

## 2022-07-05 ENCOUNTER — Encounter: Payer: Self-pay | Admitting: *Deleted

## 2022-07-05 ENCOUNTER — Telehealth: Payer: Self-pay | Admitting: *Deleted

## 2022-07-05 NOTE — Telephone Encounter (Signed)
Chart review indicates pt had Cologuard ordered but not completed 02/20/21. She had a referral to GI ordered  06/07/21 and again 11/28/21- unable to view any documentation r/t referral order f/u by [facility] or GI on both occasions. On 01/30/22, Dr. Margarita Rana documented that pt said she had a colonoscopy done at [facility]. Letter faxed to [place] office on 07/05/22, asking for colonoscopy/ CRC testing results.

## 2022-07-05 NOTE — Telephone Encounter (Signed)
Copied from [place] [id number]. Topic: General - Other >> Jul 05, 2022 12:00 PM [place] B wrote: Reason for CRM: Patient called in asking to speak with [facility] Lathe. She said ita an emergency and wouldn't go into details.

## 2022-07-07 NOTE — Progress Notes (Signed)
Patient ID: April [name], female    DOB: [birth year], 52 y.o.   MRN: [id number]   HPI F former smoker followed for chronic severe asthma/ Xolair complicated by non-compliance,  OSA/ CPAP,  morbid obesity, HBP, depression, GERD, AFib/ Eliquis  NPSG 03/31/07- AHI 5.8/ hr, weight 330 lbs. She canceled planned more recent study. Office spirometry 10/24/14- severe obstructive airways disease. FVC 2.10/66%, FEV1 1.25/47%, FEV1/FVC 0.59, FEF 25-75 percent 0.64/19% Unattended HST 07/06/16- AHI 11/hour, desaturation to 74%. Office Spirometry 07/29/2016-moderately severe obstructive airways disease. FVC 2.21/68%, FEV1 1.53/57%, ratio 0.69, FEF 25-75% 0.98/34% Prolonged hosp [facility] 4/19- tracheostomy. Dupixent enrolled 10/22/2018 HST 12/26/2018- AHI 9.7/ hr, desaturation to 9.7/ hr, desaturation to 69% NPSG 05/19/22- AHI 0/ hr, desat to 95% Primary Snoring, body weight 315 lbs -------------------------------------------------------   01/28/22- 52 year old female Smoker followed for chronic severe asthma/Xolair, Tracheostomy at [facility] 2019, Insomnia,  complicated by noncompliance, OSA/ oral appliance, morbid Obesity, HTN, depression, GERD Covid infection, PAFib, dCHF, Dyslipidemia,  Dupixent enrolled 10/22/2018 changed to [facility] 2 2023. O2 2L prn -Proair hfa, Trelegy  100, Singulair, Neb Brovana, Tezspire, Ambien 10, Vyvanse, Tezspire, Neb [place]?, Phentermine, Clonazepam,  Body weight today- 333 lbs Covid vax-2 Phizer Flu vax- Hosp 9/11-9/17/23  Asthma exacerbation, Acute Respiratory Failure with Hypoxia,  We discussed recent hospitalization.  She feels fairly well controlled today but always fragile.  She asks to keep prednisone on hand for use as needed and we discussed this again, particularly in view of her obesity and other health problems.  She has oxygen at home used when needed and for sleep.  Says she is getting Tezspire injections regularly.  Gives her own but would prefer that they be given here.   Question of whether she even can administer her own injection deeper than body fat. Body weight today - 333 lbs CXR 01/07/22 1V- FINDINGS: The heart size and mediastinal contours are within normal limits. Both lungs are clear. The visualized skeletal structures are unremarkable. IMPRESSION: No active disease.  07/09/22- 52 year old female Smoker followed for chronic severe Asthma/ COPD/Xolair, Tracheostomy at [facility] 2019, Insomnia,  complicated by noncompliance, OSA/ oral appliance, morbid Obesity, HTN, depression, GERD Covid infection, PAFib/ Eliquis, dCHF, Dyslipidemia, Depression,  Dupixent enrolled 10/22/2018 changed to [facility] 2023. O2 2L prn -Proair hfa, Trelegy  100, Singulair, Neb Brovana, Tezspire, Ambien 10, Vyvanse, Neb Brovana?, Phentermine, Clonazepam, Prednisone 20 mg daily. Body weight today- 333 lbs Covid vax-2 Phizer Flu vax- [name], NP 05/15/22-hosp f/u after hosp December for As/COPD exacerb. Apopealing to insurance to restart Tezspire, out of meds, had come off CPAP. ED 3/4- Exacerb AsBr> solumedrol, nebs, prednisone ED 3/5- Asthma NPSG 05/19/22- AHI 0/ hr, desat to 95% Primary Snoring, body weight 315 lbs Gets Tezspire injections here- says her hands shake too much for self administration. Fair control of asthma in last couple of weeks, now that she has her meds. We emphasized calling us before running out. Sheet rock fell down in her home and she complains of mold smell. Steroid talk again. She will try reducing to 20 mg alt with 10 mg every other day. CXR 07/01/22- No active disease   Review of Systems-See HPI  + = positive Constitutional:   + weight loss, night sweats, fevers, chills, +fatigue, lassitude. HEENT:   No-  headaches, difficulty swallowing, tooth/dental problems, sore throat,    sneezing, itching, ear ache, +nasal congestion, post nasal drip,  CV:  No-   chest pain, orthopnea, PND, swelling in lower extremities, anasarca, dizziness, palpitations Resp:  +  shortness of breath with exertion or at rest.              productive cough,  + non-productive cough,  No- coughing up of blood.              No-   change in color of mucus. +wheezing.   Skin: No-   rash or lesions. GI:  No-   heartburn, indigestion, abdominal pain, nausea, vomiting,  GU: MS:  No-   joint pain or swelling.   Neuro-     nothing unusual Psych:  No- change in mood or affect. No depression or anxiety.  No memory loss.    Objective:   Physical Exam    General- Alert, Oriented, Affect-appropriate, Distress- none acute, + morbid  obesity,  Skin- rash-none, lesions- none, excoriation- none Lymphadenopathy- none Head- atraumatic            Eyes- Gross vision intact, PERRLA, conjunctivae clear secretions            Ears- Hearing, canals-normal            Nose- Clear, no-Septal dev, mucus, polyps, erosion, perforation.            Throat- Mallampati III , mucosa clear , drainage- none, tonsils- atrophic,                           + Missing  teeth, +upper denture plate is out                  Neck- flexible , trachea+ stoma scar, no stridor , thyroid nl, carotid no bruit Chest - symmetrical excursion , unlabored           Heart/CV- RRR , no murmur , no gallop  , no rub, nl s1 s2                           - JVD- none , edema- none, stasis changes- none, varices- none           Lung- +Diminished, . Wheeze+mild, cough-none,  dullness-none,  rub- none            Chest wall Abd-  Br/ Gen/ Rectal- Not done, not indicated Extrem- cyanosis- none, clubbing, none, atrophy- none, strength- nl Neuro- grossly intact to observation

## 2022-07-08 NOTE — Telephone Encounter (Signed)
I spoke to the patient.  She wanted to tell me she was in the ED last week because some sheet rock feel on her head.  She was evaluated in the ED and discharged home.  She said her headaches continue to come and go. She has been resting today and has appointments tomorrow with Dr Margarita Rana and with pulmonary.  She did not feel she needs to return to ED at this time but understands when to return.She is supposed to follow up with neurology and had an appointment in [facility] today but was not able to get there. She is asking that Dr Margarita Rana place a referral for her to [facility].    She also informed me that she has been in contact with the [employer] program but said it now has a new name after merging with another agency. She could not remember the new name of the program but she said they told her that they will be in contact with her about the next steps to obtain housing.  In the meantime she is concerned about the open ceiling in her apartment. The lease is under her mother's name.  She said she is talking to her attorney about the injury but she still is concerned about the repairs needed in the apartment.  I provided her information about Legal Aid Housing assistance at the [facility] on Tuesdays and Wednesday. I also left information about this program with Doloris Hall, CMA for the patient to pick up when she comes to the clinic for her appt tomorrow.

## 2022-07-09 ENCOUNTER — Ambulatory Visit (INDEPENDENT_AMBULATORY_CARE_PROVIDER_SITE_OTHER): Payer: 59 | Admitting: Internal Medicine

## 2022-07-09 ENCOUNTER — Encounter: Payer: Self-pay | Admitting: Internal Medicine

## 2022-07-09 ENCOUNTER — Other Ambulatory Visit: Payer: Self-pay | Admitting: Family Medicine

## 2022-07-09 ENCOUNTER — Ambulatory Visit: Payer: 59 | Attending: Family Medicine | Admitting: Family Medicine

## 2022-07-09 ENCOUNTER — Encounter: Payer: Self-pay | Admitting: Family Medicine

## 2022-07-09 VITALS — BP 138/91 | HR 81 | Ht 65.0 in | Wt 312.6 lb

## 2022-07-09 VITALS — BP 116/88 | HR 78 | Ht 66.0 in | Wt 314.4 lb

## 2022-07-09 DIAGNOSIS — J4541 Moderate persistent asthma with (acute) exacerbation: Secondary | ICD-10-CM

## 2022-07-09 DIAGNOSIS — Z5982 Transportation insecurity: Secondary | ICD-10-CM

## 2022-07-09 DIAGNOSIS — E1159 Type 2 diabetes mellitus with other circulatory complications: Secondary | ICD-10-CM | POA: Diagnosis not present

## 2022-07-09 DIAGNOSIS — J9621 Acute and chronic respiratory failure with hypoxia: Secondary | ICD-10-CM

## 2022-07-09 DIAGNOSIS — F419 Anxiety disorder, unspecified: Secondary | ICD-10-CM | POA: Diagnosis not present

## 2022-07-09 DIAGNOSIS — S0990XD Unspecified injury of head, subsequent encounter: Secondary | ICD-10-CM

## 2022-07-09 DIAGNOSIS — Z6841 Body Mass Index (BMI) 40.0 and over, adult: Secondary | ICD-10-CM

## 2022-07-09 DIAGNOSIS — E6609 Other obesity due to excess calories: Secondary | ICD-10-CM

## 2022-07-09 DIAGNOSIS — R519 Headache, unspecified: Secondary | ICD-10-CM

## 2022-07-09 DIAGNOSIS — B001 Herpesviral vesicular dermatitis: Secondary | ICD-10-CM

## 2022-07-09 DIAGNOSIS — I152 Hypertension secondary to endocrine disorders: Secondary | ICD-10-CM

## 2022-07-09 DIAGNOSIS — Z1211 Encounter for screening for malignant neoplasm of colon: Secondary | ICD-10-CM

## 2022-07-09 MED ORDER — VALACYCLOVIR HCL 500 MG PO TABS: 500.0000 mg | ORAL_TABLET | Freq: Two times a day (BID) | ORAL | 0 refills | Status: DC

## 2022-07-09 NOTE — Patient Instructions (Signed)
Cold Sore  A cold sore, also called a fever blister, is a small, fluid-filled sore that forms inside of the mouth or on the lips, gums, nose, chin, or cheeks. Cold sores can spread to other parts of the body, such as the eyes, fingers, or genitals. Cold sores can spread from person to person (are contagious) until the sores crust over completely. Most cold sores go away within 2 weeks. What are the causes? Cold sores are caused by a virus (herpes simplex virus type 1, HSV-1). The virus can spread from person to person through close contact, such as through: Kissing. Touching the affected area. Sharing personal items such as lip balm, razors, a drinking glass, or eating utensils. What increases the risk? Being tired, stressed, or sick. Having your period (menstruating). Being pregnant. Taking certain medicines. Being out in cold weather or getting too much sun. What are the signs or symptoms? Symptoms of a cold sore go through different stages: Tingling, itching, or burning is felt 1-2 days before the cold sore appears. Fluid-filled blisters appear on the lips, inside the mouth, on the nose, or on the cheeks. The blisters start to ooze clear fluid. The blisters dry up, and a yellow crust appears in their place. The crust falls off. In some cases, other symptoms can develop along with cold sores. These can include: Fever. Sore throat. Headache. Muscle aches. Swollen neck glands. How is this treated? There is no cure for cold sores or the virus that causes them. There is also no vaccine to prevent the virus. Most cold sores go away on their own without treatment within 2 weeks. Your doctor may prescribe medicines to: Help with pain. Keep the virus from growing. Help you heal faster. Medicines may be in the form of creams, gels, pills, or a shot. Follow these instructions at home: Medicines Take or apply over-the-counter and prescription medicines only as told by your doctor. Use a  cotton-tip swab to apply creams or gels to your sores. Ask your doctor if you can take lysine supplements. These may help with healing. Sore care  Do not touch the sores or pick the scabs. Wash your hands often with soap and water for at least 20 seconds. Do not touch your eyes without washing your hands first. Keep the sores clean and dry. If told, put ice on the sores. To do this: Put ice in a plastic bag. Place a towel between your skin and the bag. Leave the ice on for 20 minutes, 2-3 times a day. Take off the ice if your skin turns bright red. This is very important. If you cannot feel pain, heat, or cold, you have a greater risk of damage to the area. Eating and drinking Eat a soft, bland diet. Avoid eating hot, cold, or salty foods. These can hurt your mouth. Use a straw if it hurts to drink out of a glass. Eat foods that have a lot of lysine in them. These include meat, fish, and dairy products. Avoid sugary foods, chocolates, nuts, and grains. These foods have a high amount of a substance (arginine) that can cause the virus to grow. Lifestyle Do not kiss, have oral sex, or share personal items until your sores heal. Stress, poor sleep, and being out in the sun can trigger a cold sore. Make sure you: Do activities that help you relax, such as deep breathing exercises or meditation. Get enough sleep. Put sunscreen on your lips before you go out in the sun. Contact a   doctor if: You have symptoms for more than 2 weeks. You have pus coming from the sores. You have redness that is spreading. You have pain or irritation in your eye. You get sores on your genitals. Your sores do not heal within 2 weeks. You get cold sores often. Get help right away if: You have a fever and your symptoms suddenly get worse. You have a headache and confusion. You have tiredness (fatigue). You do not want to eat as much as normal (loss of appetite). You have a stiff neck or are sensitive to  light. Summary A cold sore is a small, fluid-filled sore that forms inside of the mouth or on the lips, gums, nose, chin, or cheeks. Cold sores can spread from person to person (are contagious) until the sores crust over completely. Most cold sores go away within 2 weeks. Wash your hands often. Do not touch your eyes without washing your hands first. Do not kiss, have oral sex, or share personal items until your sores heal. Contact a doctor if your sores do not heal within 2 weeks. This information is not intended to replace advice given to you by your health care provider. Make sure you discuss any questions you have with your health care provider. Document Revised: 01/24/2021 Document Reviewed: 01/24/2021 [employer] Patient Education  2023 [employer]  
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
## 2022-07-09 NOTE — Patient Instructions (Signed)
Prednisone-   try splitting some of your tablets so you alternate 20 mg with 1/2 (10 mg) every other day. See if you can stay stable like that.   Call f you need [place].

## 2022-07-09 NOTE — Progress Notes (Signed)
Ed Follow up Referral to neurology.
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
## 2022-07-10 ENCOUNTER — Encounter: Payer: Self-pay | Admitting: Internal Medicine

## 2022-07-11 ENCOUNTER — Encounter: Payer: Self-pay | Admitting: Internal Medicine

## 2022-07-13 ENCOUNTER — Other Ambulatory Visit: Payer: Self-pay | Admitting: Family Medicine

## 2022-07-17 LAB — COLOGUARD

## 2022-07-18 ENCOUNTER — Ambulatory Visit (INDEPENDENT_AMBULATORY_CARE_PROVIDER_SITE_OTHER): Payer: Self-pay | Admitting: Licensed Clinical Social Worker

## 2022-07-18 ENCOUNTER — Other Ambulatory Visit (HOSPITAL_COMMUNITY): Payer: Self-pay

## 2022-07-18 DIAGNOSIS — Z91199 Patient's noncompliance with other medical treatment and regimen due to unspecified reason: Secondary | ICD-10-CM

## 2022-07-18 NOTE — Progress Notes (Signed)
LCSW counselor attempted to connect with patient for scheduled in office appointment scheduled at [place] did not show in the office for the appointment.   Per [facility] policy visit will be coded as no show

## 2022-07-19 ENCOUNTER — Inpatient Hospital Stay (HOSPITAL_COMMUNITY)
Admission: EM | Admit: 2022-07-19 | Discharge: 2022-07-22 | DRG: 191 | Disposition: A | Discharge status: Home / Self Care | Payer: 59 | Attending: Internal Medicine | Admitting: Internal Medicine

## 2022-07-19 ENCOUNTER — Other Ambulatory Visit: Payer: Self-pay

## 2022-07-19 ENCOUNTER — Emergency Department (HOSPITAL_COMMUNITY): Payer: 59

## 2022-07-19 ENCOUNTER — Encounter (HOSPITAL_COMMUNITY): Payer: Self-pay | Admitting: Family Medicine

## 2022-07-19 DIAGNOSIS — J441 Chronic obstructive pulmonary disease with (acute) exacerbation: Principal | ICD-10-CM | POA: Diagnosis present

## 2022-07-19 DIAGNOSIS — Z7952 Long term (current) use of systemic steroids: Secondary | ICD-10-CM

## 2022-07-19 DIAGNOSIS — E039 Hypothyroidism, unspecified: Secondary | ICD-10-CM | POA: Diagnosis present

## 2022-07-19 DIAGNOSIS — Z825 Family history of asthma and other chronic lower respiratory diseases: Secondary | ICD-10-CM | POA: Diagnosis not present

## 2022-07-19 DIAGNOSIS — R519 Headache, unspecified: Secondary | ICD-10-CM | POA: Diagnosis present

## 2022-07-19 DIAGNOSIS — L83 Acanthosis nigricans: Secondary | ICD-10-CM | POA: Diagnosis present

## 2022-07-19 DIAGNOSIS — I5032 Chronic diastolic (congestive) heart failure: Secondary | ICD-10-CM | POA: Diagnosis present

## 2022-07-19 DIAGNOSIS — Z7951 Long term (current) use of inhaled steroids: Secondary | ICD-10-CM

## 2022-07-19 DIAGNOSIS — J4541 Moderate persistent asthma with (acute) exacerbation: Secondary | ICD-10-CM | POA: Diagnosis not present

## 2022-07-19 DIAGNOSIS — G4733 Obstructive sleep apnea (adult) (pediatric): Secondary | ICD-10-CM | POA: Diagnosis present

## 2022-07-19 DIAGNOSIS — K219 Gastro-esophageal reflux disease without esophagitis: Secondary | ICD-10-CM | POA: Diagnosis present

## 2022-07-19 DIAGNOSIS — Z6841 Body Mass Index (BMI) 40.0 and over, adult: Secondary | ICD-10-CM

## 2022-07-19 DIAGNOSIS — Z8701 Personal history of pneumonia (recurrent): Secondary | ICD-10-CM

## 2022-07-19 DIAGNOSIS — I110 Hypertensive heart disease with heart failure: Secondary | ICD-10-CM | POA: Diagnosis present

## 2022-07-19 DIAGNOSIS — M069 Rheumatoid arthritis, unspecified: Secondary | ICD-10-CM | POA: Diagnosis present

## 2022-07-19 DIAGNOSIS — Z8249 Family history of ischemic heart disease and other diseases of the circulatory system: Secondary | ICD-10-CM

## 2022-07-19 DIAGNOSIS — E559 Vitamin D deficiency, unspecified: Secondary | ICD-10-CM | POA: Diagnosis present

## 2022-07-19 DIAGNOSIS — Z79890 Hormone replacement therapy (postmenopausal): Secondary | ICD-10-CM

## 2022-07-19 DIAGNOSIS — E876 Hypokalemia: Secondary | ICD-10-CM | POA: Diagnosis present

## 2022-07-19 DIAGNOSIS — I10 Essential (primary) hypertension: Secondary | ICD-10-CM | POA: Diagnosis present

## 2022-07-19 DIAGNOSIS — F419 Anxiety disorder, unspecified: Secondary | ICD-10-CM | POA: Diagnosis present

## 2022-07-19 DIAGNOSIS — Z79899 Other long term (current) drug therapy: Secondary | ICD-10-CM | POA: Diagnosis not present

## 2022-07-19 DIAGNOSIS — F32A Depression, unspecified: Secondary | ICD-10-CM | POA: Diagnosis present

## 2022-07-19 DIAGNOSIS — N920 Excessive and frequent menstruation with regular cycle: Secondary | ICD-10-CM | POA: Diagnosis present

## 2022-07-19 DIAGNOSIS — J45901 Unspecified asthma with (acute) exacerbation: Secondary | ICD-10-CM | POA: Diagnosis present

## 2022-07-19 DIAGNOSIS — F17210 Nicotine dependence, cigarettes, uncomplicated: Secondary | ICD-10-CM | POA: Diagnosis present

## 2022-07-19 DIAGNOSIS — M170 Bilateral primary osteoarthritis of knee: Secondary | ICD-10-CM | POA: Diagnosis present

## 2022-07-19 DIAGNOSIS — Z1211 Encounter for screening for malignant neoplasm of colon: Secondary | ICD-10-CM

## 2022-07-19 DIAGNOSIS — Z91199 Patient's noncompliance with other medical treatment and regimen due to unspecified reason: Secondary | ICD-10-CM

## 2022-07-19 DIAGNOSIS — Z8616 Personal history of COVID-19: Secondary | ICD-10-CM | POA: Diagnosis not present

## 2022-07-19 DIAGNOSIS — R062 Wheezing: Secondary | ICD-10-CM | POA: Diagnosis present

## 2022-07-19 DIAGNOSIS — Z8619 Personal history of other infectious and parasitic diseases: Secondary | ICD-10-CM

## 2022-07-19 DIAGNOSIS — Z7901 Long term (current) use of anticoagulants: Secondary | ICD-10-CM

## 2022-07-19 DIAGNOSIS — J9611 Chronic respiratory failure with hypoxia: Secondary | ICD-10-CM | POA: Diagnosis present

## 2022-07-19 DIAGNOSIS — D72829 Elevated white blood cell count, unspecified: Secondary | ICD-10-CM | POA: Diagnosis present

## 2022-07-19 DIAGNOSIS — Z1152 Encounter for screening for COVID-19: Secondary | ICD-10-CM | POA: Diagnosis not present

## 2022-07-19 DIAGNOSIS — I480 Paroxysmal atrial fibrillation: Secondary | ICD-10-CM | POA: Diagnosis present

## 2022-07-19 DIAGNOSIS — E785 Hyperlipidemia, unspecified: Secondary | ICD-10-CM | POA: Diagnosis present

## 2022-07-19 DIAGNOSIS — R000 Tachycardia, unspecified: Secondary | ICD-10-CM | POA: Diagnosis present

## 2022-07-19 DIAGNOSIS — Z7985 Long-term (current) use of injectable non-insulin antidiabetic drugs: Secondary | ICD-10-CM

## 2022-07-19 DIAGNOSIS — R7303 Prediabetes: Secondary | ICD-10-CM | POA: Diagnosis present

## 2022-07-19 DIAGNOSIS — G4700 Insomnia, unspecified: Secondary | ICD-10-CM | POA: Diagnosis present

## 2022-07-19 LAB — CBC WITH DIFFERENTIAL/PLATELET
Abs Immature Granulocytes: 0.14 10*3/uL — ABNORMAL HIGH (ref 0.00–0.07)
Basophils Absolute: 0.1 10*3/uL (ref 0.0–0.1)
Basophils Relative: 0 %
Eosinophils Absolute: 0 10*3/uL (ref 0.0–0.5)
Eosinophils Relative: 0 %
HCT: 39.1 % (ref 36.0–46.0)
Hemoglobin: 12.3 g/dL (ref 12.0–15.0)
Immature Granulocytes: 1 %
Lymphocytes Relative: 12 %
Lymphs Abs: 2.9 10*3/uL (ref 0.7–4.0)
MCH: 26.3 pg (ref 26.0–34.0)
MCHC: 31.5 g/dL (ref 30.0–36.0)
MCV: 83.5 fL (ref 80.0–100.0)
Monocytes Absolute: 2.9 10*3/uL — ABNORMAL HIGH (ref 0.1–1.0)
Monocytes Relative: 13 %
Neutro Abs: 17.4 10*3/uL — ABNORMAL HIGH (ref 1.7–7.7)
Neutrophils Relative %: 74 %
Platelets: 469 10*3/uL — ABNORMAL HIGH (ref 150–400)
RBC: 4.68 MIL/uL (ref 3.87–5.11)
RDW: 17.7 % — ABNORMAL HIGH (ref 11.5–15.5)
WBC: 23.6 10*3/uL — ABNORMAL HIGH (ref 4.0–10.5)
nRBC: 0 % (ref 0.0–0.2)

## 2022-07-19 LAB — BASIC METABOLIC PANEL
Anion gap: 12 (ref 5–15)
BUN: 12 mg/dL (ref 6–20)
CO2: 21 mmol/L — ABNORMAL LOW (ref 22–32)
Calcium: 8.4 mg/dL — ABNORMAL LOW (ref 8.9–10.3)
Chloride: 99 mmol/L (ref 98–111)
Creatinine, Ser: 1.08 mg/dL — ABNORMAL HIGH (ref 0.44–1.00)
GFR, Estimated: >60 mL/min (ref >60)
Glucose, Bld: 77 mg/dL (ref 70–99)
Potassium: 3.1 mmol/L — ABNORMAL LOW (ref 3.5–5.1)
Sodium: 132 mmol/L — ABNORMAL LOW (ref 135–145)

## 2022-07-19 LAB — GLUCOSE, CAPILLARY: Glucose-Capillary: 94 mg/dL (ref 70–99)

## 2022-07-19 MED ORDER — FLUTICASONE FUROATE-VILANTEROL 200-25 MCG/ACT IN AEPB
1.0000 | INHALATION_SPRAY | Freq: Every day | RESPIRATORY_TRACT | Status: DC
  Administered 2022-07-20 – 2022-07-21 (×2): 1 via RESPIRATORY_TRACT
  Filled 2022-07-19: qty 28

## 2022-07-19 MED ORDER — ONDANSETRON HCL 4 MG/2ML IJ SOLN: 4.0000 mg | Freq: Four times a day (QID) | INTRAMUSCULAR | Status: DC | PRN

## 2022-07-19 MED ORDER — FLUTICASONE PROPIONATE 50 MCG/ACT NA SUSP
1.0000 | Freq: Every day | NASAL | Status: DC
  Administered 2022-07-20 – 2022-07-22 (×3): 1 via NASAL
  Filled 2022-07-19: qty 16

## 2022-07-19 MED ORDER — MONTELUKAST SODIUM 10 MG PO TABS
10.0000 mg | ORAL_TABLET | Freq: Every day | ORAL | Status: DC
  Administered 2022-07-19 – 2022-07-21 (×3): 10 mg via ORAL
  Filled 2022-07-19 (×3): qty 1

## 2022-07-19 MED ORDER — IPRATROPIUM BROMIDE 0.02 % IN SOLN
0.5000 mg | Freq: Once | RESPIRATORY_TRACT | Status: AC
  Administered 2022-07-19: 0.5 mg via RESPIRATORY_TRACT
  Filled 2022-07-19: qty 2.5

## 2022-07-19 MED ORDER — FLUOXETINE HCL 20 MG PO CAPS
20.0000 mg | ORAL_CAPSULE | Freq: Every day | ORAL | Status: DC
  Administered 2022-07-19 – 2022-07-22 (×4): 20 mg via ORAL
  Filled 2022-07-19 (×4): qty 1

## 2022-07-19 MED ORDER — SENNOSIDES-DOCUSATE SODIUM 8.6-50 MG PO TABS: 1.0000 | ORAL_TABLET | Freq: Every evening | ORAL | Status: DC | PRN

## 2022-07-19 MED ORDER — AMLODIPINE BESYLATE 5 MG PO TABS
10.0000 mg | ORAL_TABLET | Freq: Every day | ORAL | Status: DC
  Administered 2022-07-20 – 2022-07-22 (×3): 10 mg via ORAL
  Filled 2022-07-19 (×3): qty 2

## 2022-07-19 MED ORDER — ENOXAPARIN SODIUM 40 MG/0.4ML IJ SOSY: 40.0000 mg | PREFILLED_SYRINGE | INTRAMUSCULAR | Status: DC

## 2022-07-19 MED ORDER — PREGABALIN 75 MG PO CAPS
300.0000 mg | ORAL_CAPSULE | Freq: Three times a day (TID) | ORAL | Status: DC
  Administered 2022-07-19 – 2022-07-22 (×8): 300 mg via ORAL
  Filled 2022-07-19 (×8): qty 4

## 2022-07-19 MED ORDER — ALBUTEROL SULFATE (2.5 MG/3ML) 0.083% IN NEBU
10.0000 mg/h | INHALATION_SOLUTION | Freq: Once | RESPIRATORY_TRACT | Status: AC
  Administered 2022-07-19: 10 mg/h via RESPIRATORY_TRACT
  Filled 2022-07-19: qty 12

## 2022-07-19 MED ORDER — ACETAMINOPHEN 325 MG PO TABS
650.0000 mg | ORAL_TABLET | Freq: Four times a day (QID) | ORAL | Status: DC | PRN
  Administered 2022-07-20: 650 mg via ORAL
  Filled 2022-07-19: qty 2

## 2022-07-19 MED ORDER — ACETAMINOPHEN 650 MG RE SUPP: 650.0000 mg | Freq: Four times a day (QID) | RECTAL | Status: DC | PRN

## 2022-07-19 MED ORDER — FAMOTIDINE 20 MG PO TABS
40.0000 mg | ORAL_TABLET | Freq: Every day | ORAL | Status: DC
  Administered 2022-07-19 – 2022-07-22 (×4): 40 mg via ORAL
  Filled 2022-07-19 (×4): qty 2

## 2022-07-19 MED ORDER — LACTATED RINGERS IV SOLN: INTRAVENOUS | Status: DC

## 2022-07-19 MED ORDER — OXYCODONE-ACETAMINOPHEN 5-325 MG PO TABS
1.0000 | ORAL_TABLET | Freq: Four times a day (QID) | ORAL | Status: DC | PRN
  Administered 2022-07-19 – 2022-07-22 (×8): 1 via ORAL
  Filled 2022-07-19 (×8): qty 1

## 2022-07-19 MED ORDER — TOPIRAMATE 100 MG PO TABS
200.0000 mg | ORAL_TABLET | Freq: Every day | ORAL | Status: DC
  Administered 2022-07-20 – 2022-07-21 (×2): 200 mg via ORAL
  Filled 2022-07-19 (×2): qty 2

## 2022-07-19 MED ORDER — MAGNESIUM SULFATE 2 GM/50ML IV SOLN
2.0000 g | Freq: Once | INTRAVENOUS | Status: AC
  Administered 2022-07-19: 2 g via INTRAVENOUS
  Filled 2022-07-19: qty 50

## 2022-07-19 MED ORDER — ONDANSETRON HCL 4 MG PO TABS: 4.0000 mg | ORAL_TABLET | Freq: Four times a day (QID) | ORAL | Status: DC | PRN

## 2022-07-19 MED ORDER — ZOLPIDEM TARTRATE 5 MG PO TABS
5.0000 mg | ORAL_TABLET | Freq: Every evening | ORAL | Status: DC | PRN
  Administered 2022-07-19 – 2022-07-21 (×3): 5 mg via ORAL
  Filled 2022-07-19 (×3): qty 1

## 2022-07-19 MED ORDER — CLONAZEPAM 0.5 MG PO TABS
0.5000 mg | ORAL_TABLET | Freq: Once | ORAL | Status: AC | PRN
  Administered 2022-07-20: 0.5 mg via ORAL
  Filled 2022-07-19: qty 1

## 2022-07-19 MED ORDER — METHYLPREDNISOLONE SODIUM SUCC 40 MG IJ SOLR
40.0000 mg | Freq: Two times a day (BID) | INTRAMUSCULAR | Status: DC
  Administered 2022-07-20 – 2022-07-21 (×3): 40 mg via INTRAVENOUS
  Filled 2022-07-19 (×3): qty 1

## 2022-07-19 MED ORDER — IPRATROPIUM-ALBUTEROL 0.5-2.5 (3) MG/3ML IN SOLN
3.0000 mL | RESPIRATORY_TRACT | Status: AC
  Administered 2022-07-19 – 2022-07-20 (×3): 3 mL via RESPIRATORY_TRACT
  Filled 2022-07-19 (×3): qty 3

## 2022-07-19 MED ORDER — CARVEDILOL 3.125 MG PO TABS
6.2500 mg | ORAL_TABLET | Freq: Every day | ORAL | Status: DC
  Administered 2022-07-20 – 2022-07-22 (×3): 6.25 mg via ORAL
  Filled 2022-07-19 (×3): qty 2

## 2022-07-19 MED ORDER — PANTOPRAZOLE SODIUM 40 MG PO TBEC
40.0000 mg | DELAYED_RELEASE_TABLET | Freq: Two times a day (BID) | ORAL | Status: DC
  Administered 2022-07-19 – 2022-07-22 (×6): 40 mg via ORAL
  Filled 2022-07-19 (×6): qty 1

## 2022-07-19 MED ORDER — UMECLIDINIUM BROMIDE 62.5 MCG/ACT IN AEPB
1.0000 | INHALATION_SPRAY | Freq: Every day | RESPIRATORY_TRACT | Status: DC
  Administered 2022-07-20 – 2022-07-22 (×3): 1 via RESPIRATORY_TRACT
  Filled 2022-07-19: qty 7

## 2022-07-19 MED ORDER — METHYLPREDNISOLONE SODIUM SUCC 125 MG IJ SOLR
125.0000 mg | Freq: Once | INTRAMUSCULAR | Status: AC
  Administered 2022-07-19: 125 mg via INTRAVENOUS
  Filled 2022-07-19: qty 2

## 2022-07-19 MED ORDER — CLONIDINE HCL 0.1 MG PO TABS
0.2000 mg | ORAL_TABLET | Freq: Two times a day (BID) | ORAL | Status: DC
  Administered 2022-07-19 – 2022-07-22 (×6): 0.2 mg via ORAL
  Filled 2022-07-19 (×6): qty 2

## 2022-07-19 MED ORDER — OXYCODONE HCL 5 MG PO TABS
5.0000 mg | ORAL_TABLET | Freq: Four times a day (QID) | ORAL | Status: DC | PRN
  Administered 2022-07-20 – 2022-07-22 (×6): 5 mg via ORAL
  Filled 2022-07-19 (×6): qty 1

## 2022-07-19 MED ORDER — LEVOTHYROXINE SODIUM 50 MCG PO TABS
50.0000 ug | ORAL_TABLET | Freq: Every day | ORAL | Status: DC
  Administered 2022-07-20 – 2022-07-22 (×3): 50 ug via ORAL
  Filled 2022-07-19 (×3): qty 1

## 2022-07-19 MED ORDER — APIXABAN 5 MG PO TABS
5.0000 mg | ORAL_TABLET | Freq: Two times a day (BID) | ORAL | Status: DC
  Administered 2022-07-19 – 2022-07-22 (×6): 5 mg via ORAL
  Filled 2022-07-19 (×6): qty 1

## 2022-07-19 MED ORDER — POTASSIUM CHLORIDE CRYS ER 20 MEQ PO TBCR
40.0000 meq | EXTENDED_RELEASE_TABLET | Freq: Once | ORAL | Status: AC
  Administered 2022-07-19: 40 meq via ORAL
  Filled 2022-07-19: qty 2

## 2022-07-19 MED ORDER — INSULIN ASPART 100 UNIT/ML IJ SOLN
0.0000 [IU] | Freq: Every day | INTRAMUSCULAR | Status: DC
  Filled 2022-07-19: qty 0.05

## 2022-07-19 MED ORDER — OXYCODONE-ACETAMINOPHEN 10-325 MG PO TABS: 1.0000 | ORAL_TABLET | Freq: Four times a day (QID) | ORAL | Status: DC | PRN

## 2022-07-19 MED ORDER — INSULIN ASPART 100 UNIT/ML IJ SOLN
0.0000 [IU] | Freq: Three times a day (TID) | INTRAMUSCULAR | Status: DC
  Filled 2022-07-19: qty 0.15

## 2022-07-19 MED ORDER — POTASSIUM CHLORIDE CRYS ER 20 MEQ PO TBCR
20.0000 meq | EXTENDED_RELEASE_TABLET | Freq: Every day | ORAL | Status: DC
  Administered 2022-07-20 – 2022-07-22 (×3): 20 meq via ORAL
  Filled 2022-07-19 (×3): qty 1

## 2022-07-19 MED ORDER — ALBUTEROL SULFATE (2.5 MG/3ML) 0.083% IN NEBU
2.5000 mg | INHALATION_SOLUTION | RESPIRATORY_TRACT | Status: DC | PRN
  Administered 2022-07-21: 2.5 mg via RESPIRATORY_TRACT
  Filled 2022-07-19: qty 3

## 2022-07-19 MED ORDER — FUROSEMIDE 40 MG PO TABS
20.0000 mg | ORAL_TABLET | Freq: Every day | ORAL | Status: DC
  Administered 2022-07-20 – 2022-07-22 (×3): 20 mg via ORAL
  Filled 2022-07-19 (×3): qty 1

## 2022-07-19 NOTE — ED Notes (Signed)
ED TO INPATIENT HANDOFF REPORT  Name/Age/Gender April [name] 52 y.o. female  Code Status Code Status History     Date Active Date Inactive Code Status Order ID Comments User Context   04/28/2022 1037 05/02/2022 1603 Full Code [id number]  [name], MD ED   04/09/2022 1144 04/13/2022 1501 Full Code [id number]  Lequita Halt, MD ED   04/04/2022 1408 04/07/2022 2101 Full Code [id number]  Jonnie Finner, DO ED   01/08/2022 0350 01/13/2022 1652 Full Code [id number]  [name], MD ED   10/24/2021 0742 10/26/2021 1605 Full Code [id number]  Shela Leff, MD Inpatient   10/09/2021 0301 10/18/2021 1610 Full Code [id number]  Toy Baker, MD Inpatient   08/26/2021 2234 08/30/2021 1719 Full Code [id number]  Shela Leff, MD Inpatient   08/23/2021 2319 08/24/2021 0854 Full Code [id number]  Lenore Cordia, MD ED   05/23/2021 1744 05/26/2021 1730 Full Code [id number]  Terrilee Croak, MD ED   02/03/2021 2258 02/07/2021 1554 Full Code [id number]  Marcelyn Bruins, MD ED   02/20/2019 0743 02/20/2019 1648 Full Code [id number]  Shela Leff, MD ED   02/14/2019 0453 02/14/2019 1237 Full Code [id number]  [name], DO ED   10/26/2018 0456 10/27/2018 1806 Full Code [id number]  [name], MD Inpatient   08/02/2018 2303 08/04/2018 1532 Full Code [id number]  [name], MD ED   05/16/2018 0548 05/20/2018 1739 Full Code [id number]  [name], MD ED   10/08/2017 1637 10/15/2017 1722 Full Code [id number]  [name], MD ED   08/07/2017 0405 08/18/2017 2018 Full Code [id number]  Merton Border, MD ED   07/29/2017 0627 08/06/2017 1524 Full Code [id number]  [name], MD ED   07/07/2017 0717 07/12/2017 2008 Full Code [id number]  Rondel Jumbo, PA-C ED   06/06/2017 0836 06/08/2017 2107 Full Code [id number]  [name], MD ED   05/26/2017 0617 06/01/2017 1653 Full Code [id number]  [name], MD ED   04/27/2017 1623 04/30/2017 1742 Full Code [id number]  Rondel Jumbo, PA-C ED   02/15/2017  0226 02/16/2017 1806 Full Code [id number]  [name], MD Inpatient   02/11/2017 0038 02/14/2017 0537 Full Code [id number]  [name], MD ED   01/27/2017 1024 02/01/2017 1751 Full Code [id number]  Jani Gravel, MD ED   01/17/2017 0514 01/17/2017 2021 Full Code [id number]  [name], MD ED   01/01/2017 0818 01/02/2017 1920 Full Code [id number]  [name], NP ED   12/21/2016 2015 12/22/2016 1723 Full Code [id number]  [name], MD ED   12/10/2016 1852 12/13/2016 1229 Full Code [id number]  [name], MD ED   11/13/2016 2017 11/15/2016 1758 Full Code [id number]  [name], MD ED   10/19/2016 0204 10/20/2016 1755 Full Code [id number]  [name], MD ED   10/02/2016 0553 10/04/2016 2204 Full Code [id number]  [name], MD Inpatient   06/22/2016 2104 06/25/2016 1707 Full Code [id number]  [name], DO ED   05/05/2016 2044 05/07/2016 1601 Full Code [id number]  [name], MD Inpatient   01/30/2016 1937 01/31/2016 1801 Full Code [id number]  [name], MD Inpatient   12/19/2015 0640 12/21/2015 1851 Full Code [id number]  [name], MD Inpatient   11/27/2015 0019 11/27/2015 2031 Full Code [id number]  [name], MD ED   11/10/2015 1408 11/12/2015 1641 Full Code [id number]  [name], MD ED   10/24/2015 0933 10/26/2015 1626 Full Code [id number]  [name], MD ED   08/22/2015 2052 08/24/2015 1700 Full Code [id number]  [name], MD ED   08/14/2015 1624 08/15/2015 1644 Full Code [id number]  [name], MD ED   07/12/2015 1429 07/13/2015 0015 Full Code [id number]  [name], MD Inpatient   05/23/2015 1650 05/24/2015 1819 Full Code [id number]  [name], MD ED   03/13/2015 1143 03/14/2015 1742 Full Code [id number]  [name], MD Inpatient   11/03/2014 0444 11/03/2014 1823 Full Code [id number]  [name], MD Inpatient   09/15/2014 1326 09/16/2014 1658 Full Code [id number]  [name] Inpatient   09/08/2014 1401 09/09/2014 1752 Full Code  [id number]  [name], MD Inpatient   03/27/2014 2111 03/28/2014 1754 Full Code [id number]  [name], MD Inpatient   11/23/2013 1717 11/24/2013 0009 Full Code [id number]  Clinton Gallant, MD Inpatient   06/06/2013 2326 06/07/2013 1912 Full Code [id number]  [name], MD Inpatient   11/02/2012 1854 11/03/2012 1909 Full Code [id number]  [name], MD Inpatient   10/21/2012 1030 10/23/2012 1730 Full Code [id number]  [name], MD Inpatient   10/06/2012 2349 10/07/2012 0657 Full Code [id number]  [name], MD Inpatient   05/22/2012 2200 05/23/2012 1627 Full Code [id number]  [name], RN Inpatient   02/27/2012 0219 02/27/2012 1728 Full Code [id number]  [name], MD Inpatient    Questions for Most Recent Historical Code Status (Order [id number])     Question Answer   By: Consent: discussion documented in EHR            Home/SNF/Other Home  Chief Complaint Acute asthma exacerbation [J45.901]  Level of Care/Admitting Diagnosis ED Disposition     ED Disposition  Admit   Condition  --   [place]: [place] [id number]  Level of Care: Telemetry [5]  Admit to tele based on following criteria: Other see comments  Comments: arrythmia  May [facility] patient to [facility] or [facility] if equivalent level of care is available:: Yes  Covid Evaluation: Confirmed COVID Negative  Diagnosis: Acute asthma exacerbation [id number]  Admitting Physician: Quintella Baton [[id number]]  Attending Physician: Quintella Baton [id number]  Certification:: I certify this patient will need inpatient services for at least 2 midnights  Estimated Length of Stay: 2          Medical History Past Medical History:  Diagnosis Date   Acanthosis nigricans    Anxiety    Arthritis    "knees" (04/28/2017)   Asthma    Followed by Dr. Annamaria Boots (pulmonology); receives every other week omalizumab injections; has frequent exacerbations   Back pain    Chronic  diastolic CHF (congestive heart failure) ([place]) 01/17/2017   COPD (chronic obstructive pulmonary disease) ([place])    PFTs in 2002, FEV1/FVC 65, no post bronchodilater test done   Depression    GERD (gastroesophageal reflux disease)    Headache(784.0)    "q couple days" (123456)   Helicobacter pylori (H. pylori) infection    Hypertension, essential    Insomnia    Joint pain    Lower extremity edema    Menorrhagia    Morbid obesity ([place])    OSA on CPAP    Sleep study 2008 - mild OSA, not enough events to titrate CPAP; wears CPAP now/pt on 04/28/2017   Pneumonia X 1   Prediabetes    Rheumatoid arthritis (HCC)    Seasonal allergies    Shortness of breath  Tobacco user    Vitamin D deficiency     Allergies Allergies  Allergen Reactions   Contrast Media [Iodinated Contrast Media] Itching    CT contrast. Unknown per PT.     IV Location/Drains/Wounds Patient Lines/Drains/Airways Status     Active Line/Drains/Airways     Name Placement date Placement time Site Days   Peripheral IV 07/19/22 20 G Left Antecubital 07/19/22  1744  Antecubital  less than 1            Labs/Imaging Results for orders placed or performed during the hospital encounter of 07/19/22 (from the past 48 hour(s))  CBC with Differential/Platelet     Status: Abnormal   Collection Time: 07/19/22  5:50 PM  Result Value Ref Range   WBC 23.6 (H) 4.0 - 10.5 K/uL   RBC 4.68 3.87 - 5.11 MIL/uL   Hemoglobin 12.3 12.0 - 15.0 g/dL   HCT 39.1 36.0 - 46.0 %   MCV 83.5 80.0 - 100.0 fL   MCH 26.3 26.0 - 34.0 pg   MCHC 31.5 30.0 - 36.0 g/dL   RDW 17.7 (H) 11.5 - 15.5 %   Platelets 469 (H) 150 - 400 K/uL   nRBC 0.0 0.0 - 0.2 %   Neutrophils Relative % 74 %   Neutro Abs 17.4 (H) 1.7 - 7.7 K/uL   Lymphocytes Relative 12 %   Lymphs Abs 2.9 0.7 - 4.0 K/uL   Monocytes Relative 13 %   Monocytes Absolute 2.9 (H) 0.1 - 1.0 K/uL   Eosinophils Relative 0 %   Eosinophils Absolute 0.0 0.0 - 0.5 K/uL   Basophils  Relative 0 %   Basophils Absolute 0.1 0.0 - 0.1 K/uL   Immature Granulocytes 1 %   Abs Immature Granulocytes 0.14 (H) 0.00 - 0.07 K/uL    Comment: Performed at [facility], [place] [address] [place], [place] [id number]  Basic metabolic panel     Status: Abnormal   Collection Time: 07/19/22  5:50 PM  Result Value Ref Range   Sodium 132 (L) 135 - 145 mmol/L   Potassium 3.1 (L) 3.5 - 5.1 mmol/L   Chloride 99 98 - 111 mmol/L   CO2 21 (L) 22 - 32 mmol/L   Glucose, Bld 77 70 - 99 mg/dL    Comment: Glucose reference range applies only to samples taken after fasting for at least 8 hours.   BUN 12 6 - 20 mg/dL   Creatinine, Ser 1.08 (H) 0.44 - 1.00 mg/dL   Calcium 8.4 (L) 8.9 - 10.3 mg/dL   GFR, Estimated >60 >60 mL/min    Comment: (NOTE) Calculated using the CKD-EPI Creatinine Equation (2021)    Anion gap 12 5 - 15    Comment: Performed at [facility], [place] [address] [place], [place] [address]   *Note: Due to a large number of results and/or encounters for the requested time period, some results have not been displayed. A complete set of results can be found in Results Review.   DG Chest Port 1 View  Result Date: 07/19/2022 CLINICAL DATA:  Shortness of breath. EXAM: PORTABLE CHEST 1 VIEW COMPARISON:  Chest radiograph dated 07/01/2022. FINDINGS: No focal consolidation, pleural effusion, pneumothorax. The cardiac silhouette is within limits. No acute osseous pathology. IMPRESSION: No active disease. Electronically Signed   By: Anner Crete M.D.   On: 07/19/2022 17:50    Pending Labs [employer] (From admission, onward)     Start     Ordered  07/19/22 1936  Hemoglobin A1c  Once,   R       Comments: To assess prior glycemic control    07/19/22 1936   07/19/22 1732  Resp panel by RT-PCR (RSV, Flu A&B, Covid) Anterior Nasal Swab  Once,   URGENT        07/19/22 1731            Vitals/Pain Today's Vitals   07/19/22 1723 07/19/22 1730  07/19/22 1750 07/19/22 1807  BP:  (!) 167/92    Pulse:  83    Resp: 19  20   Temp:      TempSrc:      SpO2:  100%  96%  Weight:      Height:      PainSc:        Isolation Precautions No active isolations  Medications Medications  lactated ringers infusion ( Intravenous New Bag/Given 07/19/22 1744)  methylPREDNISolone sodium succinate (SOLU-MEDROL) 40 mg/mL injection 40 mg (has no administration in time range)  ipratropium-albuterol (DUONEB) 0.5-2.5 (3) MG/3ML nebulizer solution 3 mL (has no administration in time range)  albuterol (PROVENTIL) (2.5 MG/3ML) 0.083% nebulizer solution 2.5 mg (has no administration in time range)  apixaban (ELIQUIS) tablet 5 mg (has no administration in time range)  amLODipine (NORVASC) tablet 10 mg (has no administration in time range)  carvedilol (COREG) tablet 6.25 mg (has no administration in time range)  clonazePAM (KLONOPIN) tablet 0.5 mg (has no administration in time range)  cloNIDine (CATAPRES) tablet 0.2 mg (has no administration in time range)  fluticasone furoate-vilanterol (BREO ELLIPTA) 200-25 MCG/ACT 1 puff (has no administration in time range)    And  umeclidinium bromide (INCRUSE ELLIPTA) 62.5 MCG/ACT 1 puff (has no administration in time range)  levothyroxine (SYNTHROID) tablet 50 mcg (has no administration in time range)  montelukast (SINGULAIR) tablet 10 mg (has no administration in time range)  topiramate (TOPAMAX) tablet 200 mg (has no administration in time range)  insulin aspart (novoLOG) injection 0-15 Units (has no administration in time range)  insulin aspart (novoLOG) injection 0-5 Units (has no administration in time range)  famotidine (PEPCID) tablet 40 mg (has no administration in time range)  FLUoxetine (PROZAC) capsule 20 mg (has no administration in time range)  furosemide (LASIX) tablet 20 mg (has no administration in time range)  albuterol (PROVENTIL) (2.5 MG/3ML) 0.083% nebulizer solution (10 mg/hr Nebulization  Given 07/19/22 1806)  ipratropium (ATROVENT) nebulizer solution 0.5 mg (0.5 mg Nebulization Given 07/19/22 1807)  methylPREDNISolone sodium succinate (SOLU-MEDROL) 125 mg/2 mL injection 125 mg (125 mg Intravenous Given 07/19/22 1747)  magnesium sulfate IVPB 2 g 50 mL (0 g Intravenous Stopped 07/19/22 1853)    Mobility walks with device

## 2022-07-19 NOTE — H&P (Signed)
PCP:   Hoy Register, MD   Chief Complaint:  Asthma flare  HPI: This is a 52 year old female with PMHx of chronic severe asthma, Tracheostomy at [facility] 2019OSA,morbid Obesity, HTN, depression, GERD Covid infection, PAFib, dCHF, and dyslipidemia,  complicated by noncompliance.  She has PRN oxygen which she uses nightly at 3-4 L.  Per patient for the last week her asthma keeps going to flareup.  She has been trying not to come to the hospital.  She is using nebulizers every 6 hours, without improvement.  She is getting weak, her wheezing was getting worse.  She has had no appetite in the last 3 days therefore has not eaten.  She decided to come to the ER.  In the ER, patient was chest x-ray normal.  Workup otherwise unrevealing.  Patient was given IV steroids, IV mag and nebulizers.  She was still tachycardic and tachypneic.  The hospitalist has been asked to admit.  Review of Systems:  The patient denies fever, weight loss,, vision loss, decreased hearing, hoarseness, chest pain, syncope, dyspnea on exertion, peripheral edema, balance deficits, hemoptysis, abdominal pain, melena, hematochezia, severe indigestion/heartburn, hematuria, incontinence, genital sores, muscle weakness, suspicious skin lesions, transient blindness, difficulty walking, depression, unusual weight change, abnormal bleeding, enlarged lymph nodes, angioedema, and breast masses. Positives: Cough, wheeze, weakness, lethargy, anorexia  Past Medical History: Past Medical History:  Diagnosis Date   Acanthosis nigricans    Anxiety    Arthritis    "knees" (04/28/2017)   Asthma    Followed by Dr. Maple Hudson (pulmonology); receives every other week omalizumab injections; has frequent exacerbations   Back pain    Chronic diastolic CHF (congestive heart failure) (HCC) 01/17/2017   COPD (chronic obstructive pulmonary disease) (HCC)    PFTs in 2002, FEV1/FVC 65, no post bronchodilater test done   Depression    GERD (gastroesophageal  reflux disease)    Headache(784.0)    "q couple days" (04/28/2017)   Helicobacter pylori (H. pylori) infection    Hypertension, essential    Insomnia    Joint pain    Lower extremity edema    Menorrhagia    Morbid obesity (HCC)    OSA on CPAP    Sleep study 2008 - mild OSA, not enough events to titrate CPAP; wears CPAP now/pt on 04/28/2017   Pneumonia X 1   Prediabetes    Rheumatoid arthritis (HCC)    Seasonal allergies    Shortness of breath    Tobacco user    Vitamin D deficiency    Past Surgical History:  Procedure Laterality Date   CARDIOVERSION N/A 05/30/2017   Procedure: CARDIOVERSION;  Surgeon: Thurmon Fair, MD;  Location: [facility] ENDOS[facility];  Service: Cardiovascular;  Laterality: N/A;   REDUCTION MAMMAPLASTY Bilateral 09/2011   TUBAL LIGATION  1996   bilateral    Medications: Prior to Admission medications   Medication Sig Start Date End Date Taking? Authorizing Provider  albuterol (PROAIR HFA) 108 (90 Base) MCG/ACT inhaler Inhale 2 puffs into the lungs every 6 (six) hours as needed for wheezing or shortness of breath. Patient taking differently: Inhale 2 puffs into the lungs as needed for wheezing or shortness of breath. 11/13/21   Jetty Duhamel D, MD  amLODipine (NORVASC) 10 MG tablet Take 1 tablet (10 mg total) by mouth daily. 03/11/22   Hoy Register, MD  apixaban (ELIQUIS) 5 MG TABS tablet Take 1 tablet (5 mg total) by mouth 2 (two) times daily. 11/28/21   Hoy Register, MD  baclofen (LIORESAL) 20 MG  tablet Take 20-40 mg by mouth 3 (three) times daily as needed for muscle spasms. 04/25/22   [provider]  carvedilol (COREG) 6.25 MG tablet TAKE 1 TABLET(6.25 MG) BY MOUTH TWICE DAILY WITH A MEAL Patient taking differently: Take 6.25 mg by mouth daily. 03/18/22   Hoy Register, MD  clonazePAM (KLONOPIN) 0.5 MG tablet 1 twice daily as needed for anxiety 06/17/22   Jetty Duhamel D, MD  cloNIDine (CATAPRES) 0.2 MG tablet TAKE 1 TABLET(0.2 MG) BY MOUTH TWICE  DAILY 07/13/22   Hoy Register, MD  EPINEPHrine 0.3 mg/0.3 mL IJ SOAJ injection Inject 0.3 mg into the muscle as needed for anaphylaxis. 05/30/20   [name], MD  famotidine (PEPCID) 40 MG tablet Take 40 mg by mouth daily. 01/25/22   [provider]  FLUoxetine (PROZAC) 20 MG capsule Take 1 capsule (20 mg total) by mouth daily. Take 1 capsule daily. 04/09/22   Karsten Ro, MD  fluticasone (FLONASE) 50 MCG/ACT nasal spray SHAKE LIQUID AND USE 1 SPRAY IN EACH NOSTRIL DAILY AS NEEDED FOR ALLERGIES OR RHINITIS Patient taking differently: Place 1 spray into both nostrils as needed for allergies. 08/05/21   Hoy Register, MD  Fluticasone-Umeclidin-Vilant (TRELEGY ELLIPTA) 200-62.5-25 MCG/ACT AEPB Inhale 1 puff into the lungs daily. 05/15/22   Glenford Bayley, NP  furosemide (LASIX) 20 MG tablet Take 1 tablet (20 mg total) by mouth daily. 07/09/22   Hoy Register, MD  ipratropium-albuterol (DUONEB) 0.5-2.5 (3) MG/3ML SOLN 1 neb every 6 hours if needed for asthma Patient taking differently: Take 3 mLs by nebulization every 4 (four) hours as needed (shortness of breath). 01/28/22   [name], MD  levothyroxine (SYNTHROID) 50 MCG tablet Take 1 tablet (50 mcg total) by mouth daily. 11/28/21   Hoy Register, MD  montelukast (SINGULAIR) 10 MG tablet Take 1 tablet (10 mg total) by mouth at bedtime. 05/02/22   Meredeth Ide, MD  naloxone [facility]) nasal spray 4 mg/0.1 mL Place 1 spray into the nose once as needed (opioid overdose). 08/09/20   [provider]  oxyCODONE-acetaminophen (PERCOCET) 10-325 MG tablet Take 1 tablet by mouth 5 (five) times daily as needed for pain. 03/26/22     phentermine 15 MG capsule Take 15 mg by mouth daily. 08/10/21   [provider]  potassium chloride SA (KLOR-CON M) 20 MEQ tablet TAKE 1 TABLET(20 MEQ) BY MOUTH DAILY Patient taking differently: Take 20 mEq by mouth daily. 02/01/22   Hoy Register, MD  predniSONE (DELTASONE) 20 MG tablet 2 tabs  po daily x 4 days 07/01/22   Melene Plan, DO  pregabalin (LYRICA) 300 MG capsule Take 300 mg by mouth in the morning, at noon, and at bedtime. 01/08/21   [provider]  Semaglutide, 1 MG/DOSE, (OZEMPIC, 1 MG/DOSE,) 4 MG/3ML SOPN Inject 1 mg into the skin once a week.    [provider]  Tezepelumab-ekko (TEZSPIRE) 210 MG/1. SOAJ Inject 210 mg into the skin every 28 (twenty-eight) days. 05/17/22   Jetty Duhamel D, MD  topiramate (TOPAMAX) 200 MG tablet Take 200 mg by mouth at bedtime. 01/08/21   [provider]  valACYclovir (VALTREX) 500 MG tablet Take 1 tablet (500 mg total) by mouth 2 (two) times daily. 07/09/22   Hoy Register, MD  Vitamin D, Ergocalciferol, (DRISDOL) 1.25 MG (50000 UNIT) CAPS capsule Take 50,000 Units by mouth once a week. 11/30/21   [provider]  zolpidem (AMBIEN) 10 MG tablet TAKE 1 TABLET BY MOUTH AT BEDTIME  AS NEEDED FOR SLEEP Patient taking differently: Take 10 mg by mouth at bedtime. 01/28/22   Young, Rennis Chris, MD  mometasone-formoterol (DULERA) 200-5 MCG/ACT AERO Inhale 2 puffs into the lungs 2 (two) times daily. Dx: Asthma 04/08/19 08/12/19  [name], MD    Allergies:   Allergies  Allergen Reactions   Contrast Media [Iodinated Contrast Media] Itching    CT contrast. Unknown per PT.     Social History:  reports that she quit smoking about 7 years ago. Her smoking use included cigarettes. She has a 13.00 pack-year smoking history. She has never used smokeless tobacco. She reports that she does not drink alcohol and does not use drugs.  Family History: Family History  Problem Relation Age of Onset   Hypertension Mother    Asthma Daughter    Cancer Paternal Aunt    Asthma Maternal Grandmother     Physical Exam: Vitals:   07/19/22 1723 07/19/22 1730 07/19/22 1750 07/19/22 1807  BP:  (!) 167/92    Pulse:  83    Resp: 19  20   Temp:      TempSrc:      SpO2:  100%  96%  Weight:      Height:        General:   Alert and oriented times three, extreme morbid obesity, no acute distress Eyes: PERRLA, pink conjunctiva, no scleral icterus ENT: Moist oral mucosa, neck supple, no thyromegaly Lungs: clear (patient states she feels wheezy) Cardiovascular: regular rate and rhythm, no regurgitation, no gallops, no murmurs. No carotid bruits, no JVD Abdomen: soft, positive BS, non-tender, non-distended, no organomegaly, not an acute abdomen GU: not examined Neuro: CN II - XII grossly intact, sensation intact Musculoskeletal: strength 5/5 all extremities, no clubbing, cyanosis or edema Skin: no rash, no subcutaneous crepitation, no decubitus Psych: appropriate patient  Labs on Admission:  Recent Labs    07/19/22 1750  NA 132*  K 3.1*  CL 99  CO2 21*  GLUCOSE 77  BUN 12  CREATININE 1.08*  CALCIUM 8.4*    Recent Labs    07/19/22 1750  WBC 23.6*  NEUTROABS 17.4*  HGB 12.3  HCT 39.1  MCV 83.5  PLT 469*     Radiological Exams on Admission: DG Chest Port 1 View  Result Date: 07/19/2022 CLINICAL DATA:  Shortness of breath. EXAM: PORTABLE CHEST 1 VIEW COMPARISON:  Chest radiograph dated 07/01/2022. FINDINGS: No focal consolidation, pleural effusion, pneumothorax. The cardiac silhouette is within limits. No acute osseous pathology. IMPRESSION: No active disease. Electronically Signed   By: Elgie Collard M.D.   On: 07/19/2022 17:50    Assessment/Plan Present on Admission:  Acute asthma exacerbation -IV Solu-Medrol, scheduled and as needed nebulizers -Trelegy and Dulera resumed -Oxygen as needed -Of note: Maintained on prednisone 20mg  daily   Hypokalemia -repleting PO, BMP in AM   AF (paroxysmal atrial fibrillation) (HCC) -Coreg and Eliquis resumed   Anxiety and depression -Prozac resumed   Chronic heart failure with preserved ejection fraction (HFpEF) (HCC) -Lasix, Coreg, and clonidine   HTN (hypertension) -Coreg, Norvasc resumed   Obstructive sleep apnea -Uses nocturnal  oxygen 3-4L   Chronic leukocytosis- Present since 2009. Recommend outpatient hematology f/u  Patient on ozempic and synthroid for weight loss  April [name] 07/19/2022, 7:32 PM

## 2022-07-19 NOTE — ED Triage Notes (Signed)
Pt reports sob and wheezing x days states has been using inhaler with no relief.

## 2022-07-19 NOTE — Plan of Care (Signed)
Discuss and review plan of care with patient/family  

## 2022-07-19 NOTE — ED Provider Notes (Signed)
[place] EMERGENCY DEPARTMENT AT [facility] Provider Note   CSN: [id number] Arrival date & time: 07/19/22  1710     History  Chief Complaint  Patient presents with   Shortness of Breath    Claritza MARYMAR VANDERPLAATS is a 52 y.o. female.  52 year old female with history of severe asthma and well-known to me presents with shortness of breath for a week.  States that feels like her asthma.  Feels that pollen may have triggered her.  Has had subjective chills but no reported fever.  Chest tightness but no anginal or CHF type symptoms.  No recent leg pain or swelling.  Been using her home nebulizers without relief.  Patient takes prednisone a day 20 mg chronically.  Has had nausea but no vomiting.     Home Medications Prior to Admission medications   Medication Sig Start Date End Date Taking? Authorizing Provider  albuterol (PROAIR HFA) 108 (90 Base) MCG/ACT inhaler Inhale 2 puffs into the lungs every 6 (six) hours as needed for wheezing or shortness of breath. Patient taking differently: Inhale 2 puffs into the lungs as needed for wheezing or shortness of breath. 11/13/21   Baird Lyons D, MD  amLODipine (NORVASC) 10 MG tablet Take 1 tablet (10 mg total) by mouth daily. 03/11/22   [name], MD  apixaban (ELIQUIS) 5 MG TABS tablet Take 1 tablet (5 mg total) by mouth 2 (two) times daily. 11/28/21   [name], MD  baclofen (LIORESAL) 20 MG tablet Take 20-40 mg by mouth 3 (three) times daily as needed for muscle spasms. 04/25/22   [provider]  carvedilol (COREG) 6.25 MG tablet TAKE 1 TABLET(6.25 MG) BY MOUTH TWICE DAILY WITH A MEAL Patient taking differently: Take 6.25 mg by mouth daily. 03/18/22   [name], MD  clonazePAM (KLONOPIN) 0.5 MG tablet 1 twice daily as needed for anxiety 06/17/22   Baird Lyons D, MD  cloNIDine (CATAPRES) 0.2 MG tablet TAKE 1 TABLET(0.2 MG) BY MOUTH TWICE DAILY 07/13/22   [name], MD  EPINEPHrine 0.3 mg/0.3 mL IJ SOAJ  injection Inject 0.3 mg into the muscle as needed for anaphylaxis. 05/30/20   Deneise Lever, MD  famotidine (PEPCID) 40 MG tablet Take 40 mg by mouth daily. 01/25/22   [provider]  FLUoxetine (PROZAC) 20 MG capsule Take 1 capsule (20 mg total) by mouth daily. Take 1 capsule daily. 04/09/22   Armando Reichert, MD  fluticasone (FLONASE) 50 MCG/ACT nasal spray SHAKE LIQUID AND USE 1 SPRAY IN EACH NOSTRIL DAILY AS NEEDED FOR ALLERGIES OR RHINITIS Patient taking differently: Place 1 spray into both nostrils as needed for allergies. 08/05/21   [name], MD  Fluticasone-Umeclidin-Vilant (TRELEGY ELLIPTA) 200-62.5-25 MCG/ACT AEPB Inhale 1 puff into the lungs daily. 05/15/22   Martyn Ehrich, NP  furosemide (LASIX) 20 MG tablet Take 1 tablet (20 mg total) by mouth daily. 07/09/22   [name], MD  ipratropium-albuterol (DUONEB) 0.5-2.5 (3) MG/3ML SOLN 1 neb every 6 hours if needed for asthma Patient taking differently: Take 3 mLs by nebulization every 4 (four) hours as needed (shortness of breath). 01/28/22   Deneise Lever, MD  levothyroxine (SYNTHROID) 50 MCG tablet Take 1 tablet (50 mcg total) by mouth daily. 11/28/21   [name], MD  montelukast (SINGULAIR) 10 MG tablet Take 1 tablet (10 mg total) by mouth at bedtime. 05/02/22   Oswald Hillock, MD  naloxone [facility]) nasal spray 4 mg/0.1 mL Place 1 spray into the  nose once as needed (opioid overdose). 08/09/20   [provider]  oxyCODONE-acetaminophen (PERCOCET) 10-325 MG tablet Take 1 tablet by mouth 5 (five) times daily as needed for pain. 03/26/22     phentermine 15 MG capsule Take 15 mg by mouth daily. 08/10/21   [provider]  potassium chloride SA (KLOR-CON M) 20 MEQ tablet TAKE 1 TABLET(20 MEQ) BY MOUTH DAILY Patient taking differently: Take 20 mEq by mouth daily. 02/01/22   [name], MD  predniSONE (DELTASONE) 20 MG tablet 2 tabs po daily x 4 days 07/01/22   Deno Etienne, DO  pregabalin (LYRICA) 300 MG  capsule Take 300 mg by mouth in the morning, at noon, and at bedtime. 01/08/21   [provider]  Semaglutide, 1 MG/DOSE, (OZEMPIC, 1 MG/DOSE,) 4 MG/3ML SOPN Inject 1 mg into the skin once a week.    [provider]  Tezepelumab-ekko (TEZSPIRE) 210 MG/1.91ML SOAJ Inject 210 mg into the skin every 28 (twenty-eight) days. 05/17/22   Baird Lyons D, MD  topiramate (TOPAMAX) 200 MG tablet Take 200 mg by mouth at bedtime. 01/08/21   [provider]  valACYclovir (VALTREX) 500 MG tablet Take 1 tablet (500 mg total) by mouth 2 (two) times daily. 07/09/22   [name], MD  Vitamin D, Ergocalciferol, (DRISDOL) 1.25 MG (50000 UNIT) CAPS capsule Take 50,000 Units by mouth once a week. 11/30/21   [provider]  zolpidem (AMBIEN) 10 MG tablet TAKE 1 TABLET BY MOUTH AT BEDTIME AS NEEDED FOR SLEEP Patient taking differently: Take 10 mg by mouth at bedtime. 01/28/22   Young, Kasandra Knudsen, MD  mometasone-formoterol (DULERA) 200-5 MCG/ACT AERO Inhale 2 puffs into the lungs 2 (two) times daily. Dx: Asthma 04/08/19 08/12/19  Deneise Lever, MD      Allergies    Contrast media [iodinated contrast media]    Review of Systems   Review of Systems  All other systems reviewed and are negative.  Physical Exam Updated Vital Signs BP (!) 132/110   Pulse 83   Temp 98.4 F (36.9 C) (Oral)   Resp 19   Ht 1.676 m (5\' 6" )   Wt (!) 142 kg   LMP 06/12/2022 (Approximate)   SpO2 99%   BMI 50.53 kg/m  Physical Exam Vitals and nursing note reviewed.  Constitutional:      General: She is not in acute distress.    Appearance: Normal appearance. She is well-developed. She is not toxic-appearing.  HENT:     Head: Normocephalic and atraumatic.  Eyes:     General: Lids are normal.     Conjunctiva/sclera: Conjunctivae normal.     Pupils: Pupils are equal, round, and reactive to light.  Neck:     Thyroid: No thyroid mass.     Trachea: No tracheal deviation.  Cardiovascular:     Rate  and Rhythm: Normal rate and regular rhythm.     Heart sounds: Normal heart sounds. No murmur heard.    No gallop.  Pulmonary:     Effort: Pulmonary effort is normal. No respiratory distress.     Breath sounds: Decreased air movement present. No stridor. Examination of the right-upper field reveals decreased breath sounds and wheezing. Examination of the left-upper field reveals decreased breath sounds and wheezing. Decreased breath sounds and wheezing present. No rhonchi or rales.  Abdominal:     General: There is no distension.     Palpations: Abdomen is soft.     Tenderness: There is no abdominal tenderness.  There is no rebound.  Musculoskeletal:        General: No tenderness. Normal range of motion.     Cervical back: Normal range of motion and neck supple.  Skin:    General: Skin is warm and dry.     Findings: No abrasion or rash.  Neurological:     Mental Status: She is alert and oriented to person, place, and time. Mental status is at baseline.     GCS: GCS eye subscore is 4. GCS verbal subscore is 5. GCS motor subscore is 6.     Cranial Nerves: No cranial nerve deficit.     Sensory: No sensory deficit.     Motor: Motor function is intact.  Psychiatric:        Attention and Perception: Attention normal.        Speech: Speech normal.        Behavior: Behavior normal.   ED Results / Procedures / Treatments   Labs (all labs ordered are listed, but only abnormal results are displayed) Labs Reviewed  RESP PANEL BY RT-PCR (RSV, FLU A&B, COVID)  [id number]  CBC WITH DIFFERENTIAL/PLATELET  BASIC METABOLIC PANEL    EKG EKG Interpretation  Date/Time:  Friday July 19 2022 17:50:08 EDT Ventricular Rate:  84 PR Interval:  149 QRS Duration: 98 QT Interval:  384 QTC Calculation: [id number] Axis:   24 Text Interpretation: Sinus rhythm Right atrial enlargement Confirmed by Lacretia Leigh ([id number]) on 07/19/2022 6:08:28 PM  Radiology No results found.  Procedures Procedures     Medications Ordered in ED Medications  albuterol (PROVENTIL,VENTOLIN) solution continuous neb (has no administration in time range)  ipratropium (ATROVENT) nebulizer solution 0.5 mg (has no administration in time range)  lactated ringers infusion (has no administration in time range)  methylPREDNISolone sodium succinate (SOLU-MEDROL) 125 mg/2 mL injection 125 mg (has no administration in time range)  magnesium sulfate IVPB 2 g 50 mL (has no administration in time range)    ED Course/ Medical Decision Making/ A&P                             Medical Decision Making Amount and/or Complexity of Data Reviewed Labs: ordered. Radiology: ordered. ECG/medicine tests: ordered.  Risk Prescription drug management.   Patient is EKG per interpretation shows normal sinus rhythm.  No signs of acute ischemic changes.  Chest x-ray my interpretation showed no acute findings here.  Patient remains short of breath.  She does have diminished air movement.  Was treated with Solu-Medrol, albuterol 10 mg as well as magnesium.  Patient has extensive history of being hospitalized for asthma and will require hospitalization at this time.  Will consult hospitalist team  CRITICAL CARE Performed by: Leota Jacobsen Total critical care time: 50 minutes Critical care time was exclusive of separately billable procedures and treating other patients. Critical care was necessary to treat or prevent imminent or life-threatening deterioration. Critical care was time spent personally by me on the following activities: development of treatment plan with patient and/or surrogate as well as nursing, discussions with consultants, evaluation of patient's response to treatment, examination of patient, obtaining history from patient or surrogate, ordering and performing treatments and interventions, ordering and review of laboratory studies, ordering and review of radiographic studies, pulse oximetry and re-evaluation of  patient's condition.         Final Clinical Impression(s) / ED Diagnoses Final diagnoses:  None    Rx /  DC Orders ED Discharge Orders     None         Lacretia Leigh, MD 07/19/22 1911

## 2022-07-20 DIAGNOSIS — J4541 Moderate persistent asthma with (acute) exacerbation: Secondary | ICD-10-CM | POA: Diagnosis not present

## 2022-07-20 LAB — BASIC METABOLIC PANEL
Anion gap: 10 (ref 5–15)
BUN: 13 mg/dL (ref 6–20)
CO2: 21 mmol/L — ABNORMAL LOW (ref 22–32)
Calcium: 8.3 mg/dL — ABNORMAL LOW (ref 8.9–10.3)
Chloride: 103 mmol/L (ref 98–111)
Creatinine, Ser: 0.85 mg/dL (ref 0.44–1.00)
GFR, Estimated: >60 mL/min (ref >60)
Glucose, Bld: 168 mg/dL — ABNORMAL HIGH (ref 70–99)
Potassium: 4 mmol/L (ref 3.5–5.1)
Sodium: 134 mmol/L — ABNORMAL LOW (ref 135–145)

## 2022-07-20 LAB — CBC WITH DIFFERENTIAL/PLATELET
Abs Immature Granulocytes: 0.09 10*3/uL — ABNORMAL HIGH (ref 0.00–0.07)
Basophils Absolute: 0 10*3/uL (ref 0.0–0.1)
Basophils Relative: 0 %
Eosinophils Absolute: 0 10*3/uL (ref 0.0–0.5)
Eosinophils Relative: 0 %
HCT: 35.1 % — ABNORMAL LOW (ref 36.0–46.0)
Hemoglobin: 10.9 g/dL — ABNORMAL LOW (ref 12.0–15.0)
Immature Granulocytes: 1 %
Lymphocytes Relative: 8 %
Lymphs Abs: 1.4 10*3/uL (ref 0.7–4.0)
MCH: 25.8 pg — ABNORMAL LOW (ref 26.0–34.0)
MCHC: 31.1 g/dL (ref 30.0–36.0)
MCV: 83 fL (ref 80.0–100.0)
Monocytes Absolute: 0.3 10*3/uL (ref 0.1–1.0)
Monocytes Relative: 2 %
Neutro Abs: 14.8 10*3/uL — ABNORMAL HIGH (ref 1.7–7.7)
Neutrophils Relative %: 89 %
Platelets: 411 10*3/uL — ABNORMAL HIGH (ref 150–400)
RBC: 4.23 MIL/uL (ref 3.87–5.11)
RDW: 17.5 % — ABNORMAL HIGH (ref 11.5–15.5)
WBC: 16.6 10*3/uL — ABNORMAL HIGH (ref 4.0–10.5)
nRBC: 0 % (ref 0.0–0.2)

## 2022-07-20 LAB — GLUCOSE, CAPILLARY
Glucose-Capillary: 197 mg/dL — ABNORMAL HIGH (ref 70–99)
Glucose-Capillary: 142 mg/dL — ABNORMAL HIGH (ref 70–99)
Glucose-Capillary: 99 mg/dL (ref 70–99)
Glucose-Capillary: 176 mg/dL — ABNORMAL HIGH (ref 70–99)

## 2022-07-20 LAB — RESP PANEL BY RT-PCR (RSV, FLU A&B, COVID)  RVPGX2
Influenza A by PCR: NEGATIVE
Influenza B by PCR: NEGATIVE
Resp Syncytial Virus by PCR: NEGATIVE
SARS Coronavirus 2 by RT PCR: NEGATIVE

## 2022-07-20 MED ORDER — IPRATROPIUM-ALBUTEROL 0.5-2.5 (3) MG/3ML IN SOLN
3.0000 mL | Freq: Three times a day (TID) | RESPIRATORY_TRACT | Status: DC
  Administered 2022-07-20 – 2022-07-22 (×7): 3 mL via RESPIRATORY_TRACT
  Filled 2022-07-20 (×7): qty 3

## 2022-07-20 NOTE — Progress Notes (Addendum)
Patient dropped Eliquis on the floor when RN was in the room with patient,so another dose was pulled out from pyxis and given to patient.

## 2022-07-20 NOTE — Progress Notes (Signed)
PROGRESS NOTE  April [name]  [id number] DOB: Sep 16, 1970 DOA: 07/19/2022 PCP: [name], MD   Brief Narrative: Patient is a 52 year old female with history of chronic severe asthma,COPD, status post tracheostomy, OSA, morbid obesity, hypertension, depression, GERD, paroxysmal A-fib, diastolic CHF, hyperlipidemia, chronic hypoxic respiratory failure and uses 3 to 4 L of oxygen at night who presented with wheezing, shortness of breath.  On presentation, she was dyspneic, tachycardic, tachypneic.  Chest x-ray did not show pneumonia.  Found to be wheezing.  Admitted for the management of acute exacerbation.  On IV steroids.  Assessment & Plan:  Principal Problem:   Acute asthma exacerbation Active Problems:   HTN (hypertension)   AF (paroxysmal atrial fibrillation) (HCC)   Chronic heart failure with preserved ejection fraction (HFpEF) (HCC)   Hypothyroidism   Anxiety and depression   Obstructive sleep apnea   Hyperlipidemia   Acute asthma/COPD exacerbation: Patient states she has both asthma and COPD.  She follows with Dr. Annamaria Boots, pulmonology .found to be wheezing on presentation.   Started on IV steroid.  Trelegy, Ruthe Mannan resumed.  Continue as needed bronchodilators.  Takes prednisone 20 mg daily at home as maintenance.  Will continue IV steroids for today.  When she goes, will start on tapering dose.  She will need  to follow-up with Dr. Annamaria Boots as an outpatient.  She has a nebulizer machine at home  Chronic hypoxic respiratory failure/OSA: On 3 to 4 L of oxygen at night.  History of OSA.  Does not use CPAP  Hypokalemia: Supplemented and corrected  Paroxysmal A-fib: Currently in normal sinus rhythm.  Monitor on telemetry.  Continue Coreg.  On Eliquis for anticoagulation  Anxiety/depression: On Prozac  Chronic diastolic CHF: On Lasix, Coreg, clonidine.  Currently looks euvolemic  Hypertension: On Coreg, Norvasc.  Continue.  Monitor blood pressure  Chronic leukocytosis: Present  since 2009.  Will recommend outpatient follow-up with hematology.  Might be contributed by chronic use of steroids for her COPD/asthma  Morbid obesity: BMI 50.5.  On Ozempic, Synthyroid for weight loss  Smoker: Occasionally smokes when she is stressed.  Counseled for cessation        DVT prophylaxis: apixaban (ELIQUIS) tablet 5 mg     Code Status: Full Code  Family Communication: None at the bedside  Patient status: Inpatient  Patient is from : Home  Anticipated discharge to: Home  Estimated DC date: Tomorrow   Consultants: None  Procedures: None  Antimicrobials:  Anti-infectives (From admission, onward)    None       Subjective: Patient seen and examined at bedside today.  She feels comfortable today.  On baseline oxygen requirement.  Denies any worsening shortness of breath or cough or wheezing.  Objective: Vitals:   07/19/22 2344 07/20/22 0058 07/20/22 0429 07/20/22 0715  BP: (!) 158/75  126/65   Pulse: 76  76 69  Resp: 18  19 20   Temp: 98.7 F (37.1 C)  (!) 97.5 F (36.4 C)   TempSrc: Oral  Oral   SpO2: 97% 95% 93% 95%  Weight:      Height:        Intake/Output Summary (Last 24 hours) at 07/20/2022 0842 Last data filed at 07/20/2022 0750 Gross per 24 hour  Intake 1010 ml  Output --  Net 1010 ml   Filed Weights   07/19/22 1718  Weight: (!) 142 kg    Examination:  General exam: Overall comfortable, not in distress, morbidly obese HEENT: PERRL Respiratory system: Mild diminished sounds  bilaterally, mild expiratory wheezing bilaterally Cardiovascular system: S1 & S2 heard, RRR.  Gastrointestinal system: Abdomen is nondistended, soft and nontender. Central nervous system: Alert and oriented Extremities: No edema, no clubbing ,no cyanosis Skin: No rashes, no ulcers,no icterus     Data Reviewed: I have personally reviewed following labs and imaging studies  CBC: Recent Labs  Lab 07/19/22 1750 07/20/22 0545  WBC 23.6* 16.6*  NEUTROABS  17.4* 14.8*  HGB 12.3 10.9*  HCT 39.1 35.1*  MCV 83.5 83.0  PLT 469* 123456*   Basic Metabolic Panel: Recent Labs  Lab 07/19/22 1750 07/20/22 0545  NA 132* 134*  K 3.1* 4.0  CL 99 103  CO2 21* 21*  GLUCOSE 77 168*  BUN 12 13  CREATININE 1.08* 0.85  CALCIUM 8.4* 8.3*     Recent Results (from the past 240 hour(s))  Resp panel by RT-PCR (RSV, Flu A&B, Covid) Anterior Nasal Swab     Status: None   Collection Time: 07/19/22  5:32 PM   Specimen: Anterior Nasal Swab  Result Value Ref Range Status   SARS Coronavirus 2 by RT PCR NEGATIVE NEGATIVE Final    Comment: (NOTE) SARS-CoV-2 target nucleic acids are NOT DETECTED.  The SARS-CoV-2 RNA is generally detectable in upper respiratory specimens during the acute phase of infection. The lowest concentration of SARS-CoV-2 viral copies this assay can detect is 138 copies/mL. A negative result does not preclude SARS-Cov-2 infection and should not be used as the sole basis for treatment or other patient management decisions. A negative result may occur with  improper specimen collection/handling, submission of specimen other than nasopharyngeal swab, presence of viral mutation(s) within the areas targeted by this assay, and inadequate number of viral copies(<138 copies/mL). A negative result must be combined with clinical observations, patient history, and epidemiological information. The expected result is Negative.  Fact Sheet for Patients:  EntrepreneurPulse.com.au  Fact Sheet for Healthcare Providers:  IncredibleEmployment.be  This test is no t yet approved or cleared by the [place] FDA and  has been authorized for detection and/or diagnosis of SARS-CoV-2 by FDA under an Emergency Use Authorization (EUA). This EUA will remain  in effect (meaning this test can be used) for the duration of the COVID-19 declaration under Section 564(b)(1) of the Act, 21 U.S.C.section 360bbb-3(b)(1), unless  the authorization is terminated  or revoked sooner.       Influenza A by PCR NEGATIVE NEGATIVE Final   Influenza B by PCR NEGATIVE NEGATIVE Final    Comment: (NOTE) The Xpert Xpress SARS-CoV-2/FLU/RSV plus assay is intended as an aid in the diagnosis of influenza from Nasopharyngeal swab specimens and should not be used as a sole basis for treatment. Nasal washings and aspirates are unacceptable for Xpert Xpress SARS-CoV-2/FLU/RSV testing.  Fact Sheet for Patients: EntrepreneurPulse.com.au  Fact Sheet for Healthcare Providers: IncredibleEmployment.be  This test is not yet approved or cleared by the [place] FDA and has been authorized for detection and/or diagnosis of SARS-CoV-2 by FDA under an Emergency Use Authorization (EUA). This EUA will remain in effect (meaning this test can be used) for the duration of the COVID-19 declaration under Section 564(b)(1) of the Act, 21 U.S.C. section 360bbb-3(b)(1), unless the authorization is terminated or revoked.     Resp Syncytial Virus by PCR NEGATIVE NEGATIVE Final    Comment: (NOTE) Fact Sheet for Patients: EntrepreneurPulse.com.au  Fact Sheet for Healthcare Providers: IncredibleEmployment.be  This test is not yet approved or cleared by the [place] FDA and has been  authorized for detection and/or diagnosis of SARS-CoV-2 by FDA under an Emergency Use Authorization (EUA). This EUA will remain in effect (meaning this test can be used) for the duration of the COVID-19 declaration under Section 564(b)(1) of the Act, 21 U.S.C. section 360bbb-3(b)(1), unless the authorization is terminated or revoked.  Performed at [facility], [place] [address], [place], [place] [address]      Radiology Studies: [facility] Chest Port 1 View  Result Date: 07/19/2022 CLINICAL DATA:  Shortness of breath. EXAM: PORTABLE CHEST 1 VIEW COMPARISON:  Chest  radiograph dated 07/01/2022. FINDINGS: No focal consolidation, pleural effusion, pneumothorax. The cardiac silhouette is within limits. No acute osseous pathology. IMPRESSION: No active disease. Electronically Signed   By: Anner Crete M.D.   On: 07/19/2022 17:50    Scheduled Meds:  amLODipine  10 mg Oral Daily   apixaban  5 mg Oral BID   carvedilol  6.25 mg Oral Daily   cloNIDine  0.2 mg Oral BID   famotidine  40 mg Oral Daily   FLUoxetine  20 mg Oral Daily   fluticasone  1 spray Each Nare Daily   fluticasone furoate-vilanterol  1 puff Inhalation Daily   And   umeclidinium bromide  1 puff Inhalation Daily   furosemide  20 mg Oral Daily   ipratropium-albuterol  3 mL Nebulization TID   levothyroxine  50 mcg Oral Q0600   methylPREDNISolone (SOLU-MEDROL) injection  40 mg Intravenous Q12H   montelukast  10 mg Oral QHS   pantoprazole  40 mg Oral BID   potassium chloride SA  20 mEq Oral Daily   pregabalin  300 mg Oral TID   topiramate  200 mg Oral QHS   Continuous Infusions:   LOS: 1 day   Shelly Coss, MD [facility] P3/23/2024, 8:42 AM

## 2022-07-21 DIAGNOSIS — J4541 Moderate persistent asthma with (acute) exacerbation: Secondary | ICD-10-CM | POA: Diagnosis not present

## 2022-07-21 LAB — CBC
HCT: 35.6 % — ABNORMAL LOW (ref 36.0–46.0)
Hemoglobin: 11 g/dL — ABNORMAL LOW (ref 12.0–15.0)
MCH: 25.7 pg — ABNORMAL LOW (ref 26.0–34.0)
MCHC: 30.9 g/dL (ref 30.0–36.0)
MCV: 83.2 fL (ref 80.0–100.0)
Platelets: 458 10*3/uL — ABNORMAL HIGH (ref 150–400)
RBC: 4.28 MIL/uL (ref 3.87–5.11)
RDW: 17.5 % — ABNORMAL HIGH (ref 11.5–15.5)
WBC: 20.4 10*3/uL — ABNORMAL HIGH (ref 4.0–10.5)
nRBC: 0 % (ref 0.0–0.2)

## 2022-07-21 LAB — GLUCOSE, CAPILLARY
Glucose-Capillary: 138 mg/dL — ABNORMAL HIGH (ref 70–99)
Glucose-Capillary: 171 mg/dL — ABNORMAL HIGH (ref 70–99)
Glucose-Capillary: 134 mg/dL — ABNORMAL HIGH (ref 70–99)
Glucose-Capillary: 118 mg/dL — ABNORMAL HIGH (ref 70–99)

## 2022-07-21 MED ORDER — SENNOSIDES-DOCUSATE SODIUM 8.6-50 MG PO TABS
1.0000 | ORAL_TABLET | Freq: Two times a day (BID) | ORAL | Status: DC
  Administered 2022-07-21 – 2022-07-22 (×3): 1 via ORAL
  Filled 2022-07-21 (×3): qty 1

## 2022-07-21 MED ORDER — PREDNISONE 20 MG PO TABS
40.0000 mg | ORAL_TABLET | Freq: Every day | ORAL | Status: DC
  Administered 2022-07-22: 40 mg via ORAL
  Filled 2022-07-21: qty 2

## 2022-07-21 MED ORDER — CLONAZEPAM 0.5 MG PO TABS
0.5000 mg | ORAL_TABLET | Freq: Two times a day (BID) | ORAL | Status: DC
  Administered 2022-07-21 – 2022-07-22 (×3): 0.5 mg via ORAL
  Filled 2022-07-21 (×3): qty 1

## 2022-07-21 MED ORDER — BUDESONIDE 0.5 MG/2ML IN SUSP
0.5000 mg | Freq: Two times a day (BID) | RESPIRATORY_TRACT | Status: DC
  Administered 2022-07-21 – 2022-07-22 (×2): 0.5 mg via RESPIRATORY_TRACT
  Filled 2022-07-21 (×2): qty 2

## 2022-07-21 MED ORDER — POLYETHYLENE GLYCOL 3350 17 G PO PACK
17.0000 g | PACK | Freq: Every day | ORAL | Status: DC
  Administered 2022-07-21: 17 g via ORAL
  Filled 2022-07-21 (×2): qty 1

## 2022-07-21 MED ORDER — MELATONIN 5 MG PO TABS
5.0000 mg | ORAL_TABLET | Freq: Once | ORAL | Status: AC
  Administered 2022-07-21: 5 mg via ORAL
  Filled 2022-07-21: qty 1

## 2022-07-21 NOTE — Progress Notes (Signed)
PROGRESS NOTE  April [name]  [id number] DOB: 03/20/1971 DOA: 07/19/2022 PCP: [name], MD   Brief Narrative: Patient is a 52 year old female with history of chronic severe asthma,COPD, status post tracheostomy, OSA, morbid obesity, hypertension, depression, GERD, paroxysmal A-fib, diastolic CHF, hyperlipidemia, chronic hypoxic respiratory failure and uses 3 to 4 L of oxygen at night who presented with wheezing, shortness of breath.  On presentation, she was dyspneic, tachycardic, tachypneic.  Chest x-ray did not show pneumonia.  Found to be wheezing.  Admitted for the management of acute exacerbation.  Started on IV steroids.  Respiratory status improving, currently remains on baseline oxygen requirement.  Plan for discharge tomorrow if she remains stable  Assessment & Plan:  Principal Problem:   Acute asthma exacerbation Active Problems:   HTN (hypertension)   AF (paroxysmal atrial fibrillation) (HCC)   Chronic heart failure with preserved ejection fraction (HFpEF) (HCC)   Hypothyroidism   Anxiety and depression   Obstructive sleep apnea   Hyperlipidemia   Acute asthma/COPD exacerbation: Patient states she has both asthma and COPD.  She follows with Dr. Annamaria Boots, pulmonology .found to be wheezing on presentation.   Started on IV steroid.  Trelegy, Ruthe Mannan resumed.  Continue as needed bronchodilators.  Takes prednisone 20 mg daily at home as maintenance. We will change to oral prednisone.  When she goes, will start on tapering dose.  She will need  to follow-up with Dr. Annamaria Boots as an outpatient.  She has a nebulizer machine at home  Chronic hypoxic respiratory failure/OSA: On 3 to 4 L of oxygen at night.  History of OSA.  Does not use CPAP  Hypokalemia: Supplemented and corrected  Paroxysmal A-fib: Currently in normal sinus rhythm.  Monitor on telemetry.  Continue Coreg.  On Eliquis for anticoagulation  Anxiety/depression: On Prozac  Chronic diastolic CHF: On Lasix, Coreg,  clonidine.  Currently looks euvolemic  Hypertension: On Coreg, Norvasc.  Continue.  Monitor blood pressure  Chronic leukocytosis: Present since 2009.  Will recommend outpatient follow-up with hematology.  Might be contributed by chronic use of steroids for her COPD/asthma  Morbid obesity: BMI 50.5.  On Ozempic, Synthyroid for weight loss  Smoker: Occasionally smokes when she is stressed.  Counseled for cessation        DVT prophylaxis: apixaban (ELIQUIS) tablet 5 mg     Code Status: Full Code  Family Communication: None at the bedside  Patient status: Inpatient  Patient is from : Home  Anticipated discharge to: Home  Estimated DC date: Tomorrow   Consultants: None  Procedures: None  Antimicrobials:  Anti-infectives (From admission, onward)    None       Subjective: Patient seen and examined at the bedside today.  Hemodynamically stable.  She is on baseline oxygen requirement.  Wheezing have improved.  She does not feel ready to go home today.  We discussed about changing the steroid to oral and plan for discharge tomorrow to home if she feels better tomorrow morning  Objective: Vitals:   07/20/22 1450 07/20/22 1949 07/21/22 0611 07/21/22 0811  BP:  (!) 141/70 137/67   Pulse: 86 71 (!) 55   Resp: (!) 22 18 18    Temp:  98.2 F (36.8 C) (!) 97.3 F (36.3 C)   TempSrc:  Oral Oral   SpO2: 98% 100% 99% 90%  Weight:      Height:        Intake/Output Summary (Last 24 hours) at 07/21/2022 1155 Last data filed at 07/21/2022 0820 Gross per  24 hour  Intake 718 ml  Output --  Net 718 ml   Filed Weights   07/19/22 1718  Weight: (!) 142 kg    Examination:  General exam: Overall comfortable, not in distress, morbidly obese HEENT: PERRL Respiratory system: Mild bilateral expiratory wheezing Cardiovascular system: S1 & S2 heard, RRR.  Gastrointestinal system: Abdomen is nondistended, soft and nontender. Central nervous system: Alert and  oriented Extremities: No edema, no clubbing ,no cyanosis Skin: No rashes, no ulcers,no icterus     Data Reviewed: I have personally reviewed following labs and imaging studies  CBC: Recent Labs  Lab 07/19/22 1750 07/20/22 0545 07/21/22 0502  WBC 23.6* 16.6* 20.4*  NEUTROABS 17.4* 14.8*  --   HGB 12.3 10.9* 11.0*  HCT 39.1 35.1* 35.6*  MCV 83.5 83.0 83.2  PLT 469* 411* [id number]*   Basic Metabolic Panel: Recent Labs  Lab 07/19/22 1750 07/20/22 0545  NA 132* 134*  K 3.1* 4.0  CL 99 103  CO2 21* 21*  GLUCOSE 77 168*  BUN 12 13  CREATININE 1.08* 0.85  CALCIUM 8.4* 8.3*     Recent Results (from the past 240 hour(s))  Resp panel by RT-PCR (RSV, Flu A&B, Covid) Anterior Nasal Swab     Status: None   Collection Time: 07/19/22  5:32 PM   Specimen: Anterior Nasal Swab  Result Value Ref Range Status   SARS Coronavirus 2 by RT PCR NEGATIVE NEGATIVE Final    Comment: (NOTE) SARS-CoV-2 target nucleic acids are NOT DETECTED.  The SARS-CoV-2 RNA is generally detectable in upper respiratory specimens during the acute phase of infection. The lowest concentration of SARS-CoV-2 viral copies this assay can detect is 138 copies/mL. A negative result does not preclude SARS-Cov-2 infection and should not be used as the sole basis for treatment or other patient management decisions. A negative result may occur with  improper specimen collection/handling, submission of specimen other than nasopharyngeal swab, presence of viral mutation(s) within the areas targeted by this assay, and inadequate number of viral copies(<138 copies/mL). A negative result must be combined with clinical observations, patient history, and epidemiological information. The expected result is Negative.  Fact Sheet for Patients:  EntrepreneurPulse.com.au  Fact Sheet for Healthcare Providers:  IncredibleEmployment.be  This test is no t yet approved or cleared by the [place] FDA and  has been authorized for detection and/or diagnosis of SARS-CoV-2 by FDA under an Emergency Use Authorization (EUA). This EUA will remain  in effect (meaning this test can be used) for the duration of the COVID-19 declaration under Section 564(b)(1) of the Act, 21 U.S.C.section 360bbb-3(b)(1), unless the authorization is terminated  or revoked sooner.       Influenza A by PCR NEGATIVE NEGATIVE Final   Influenza B by PCR NEGATIVE NEGATIVE Final    Comment: (NOTE) The Xpert Xpress SARS-CoV-2/FLU/RSV plus assay is intended as an aid in the diagnosis of influenza from Nasopharyngeal swab specimens and should not be used as a sole basis for treatment. Nasal washings and aspirates are unacceptable for Xpert Xpress SARS-CoV-2/FLU/RSV testing.  Fact Sheet for Patients: EntrepreneurPulse.com.au  Fact Sheet for Healthcare Providers: IncredibleEmployment.be  This test is not yet approved or cleared by the [place] FDA and has been authorized for detection and/or diagnosis of SARS-CoV-2 by FDA under an Emergency Use Authorization (EUA). This EUA will remain in effect (meaning this test can be used) for the duration of the COVID-19 declaration under Section 564(b)(1) of the Act, 21 U.S.C. section 360bbb-3(b)(1),  unless the authorization is terminated or revoked.     Resp Syncytial Virus by PCR NEGATIVE NEGATIVE Final    Comment: (NOTE) Fact Sheet for Patients: EntrepreneurPulse.com.au  Fact Sheet for Healthcare Providers: IncredibleEmployment.be  This test is not yet approved or cleared by the [place] FDA and has been authorized for detection and/or diagnosis of SARS-CoV-2 by FDA under an Emergency Use Authorization (EUA). This EUA will remain in effect (meaning this test can be used) for the duration of the COVID-19 declaration under Section 564(b)(1) of the Act, 21 U.S.C. section  360bbb-3(b)(1), unless the authorization is terminated or revoked.  Performed at [facility], [place] [address] [place], [place] [address]      Radiology Studies: [facility] Chest Port 1 View  Result Date: 07/19/2022 CLINICAL DATA:  Shortness of breath. EXAM: PORTABLE CHEST 1 VIEW COMPARISON:  Chest radiograph dated 07/01/2022. FINDINGS: No focal consolidation, pleural effusion, pneumothorax. The cardiac silhouette is within limits. No acute osseous pathology. IMPRESSION: No active disease. Electronically Signed   By: Anner Crete M.D.   On: 07/19/2022 17:50    Scheduled Meds:  amLODipine  10 mg Oral Daily   apixaban  5 mg Oral BID   budesonide (PULMICORT) nebulizer solution  0.5 mg Nebulization BID   carvedilol  6.25 mg Oral Daily   clonazePAM  0.5 mg Oral BID   cloNIDine  0.2 mg Oral BID   famotidine  40 mg Oral Daily   FLUoxetine  20 mg Oral Daily   fluticasone  1 spray Each Nare Daily   furosemide  20 mg Oral Daily   ipratropium-albuterol  3 mL Nebulization TID   levothyroxine  50 mcg Oral Q0600   montelukast  10 mg Oral QHS   pantoprazole  40 mg Oral BID   polyethylene glycol  17 g Oral Daily   potassium chloride SA  20 mEq Oral Daily   predniSONE  40 mg Oral Q breakfast   pregabalin  300 mg Oral TID   senna-docusate  1 tablet Oral BID   topiramate  200 mg Oral QHS   umeclidinium bromide  1 puff Inhalation Daily   Continuous Infusions:   LOS: 2 days   Shelly Coss, MD [facility] P3/24/2024, 11:55 AM

## 2022-07-22 DIAGNOSIS — J4541 Moderate persistent asthma with (acute) exacerbation: Secondary | ICD-10-CM | POA: Diagnosis not present

## 2022-07-22 LAB — GLUCOSE, CAPILLARY: Glucose-Capillary: 99 mg/dL (ref 70–99)

## 2022-07-22 LAB — HEMOGLOBIN A1C
Hgb A1c MFr Bld: 5.9 % — ABNORMAL HIGH (ref 4.8–5.6)
Mean Plasma Glucose: 123 mg/dL

## 2022-07-22 LAB — CBC
HCT: 34.3 % — ABNORMAL LOW (ref 36.0–46.0)
Hemoglobin: 10.6 g/dL — ABNORMAL LOW (ref 12.0–15.0)
MCH: 25.8 pg — ABNORMAL LOW (ref 26.0–34.0)
MCHC: 30.9 g/dL (ref 30.0–36.0)
MCV: 83.5 fL (ref 80.0–100.0)
Platelets: 425 10*3/uL — ABNORMAL HIGH (ref 150–400)
RBC: 4.11 MIL/uL (ref 3.87–5.11)
RDW: 17.5 % — ABNORMAL HIGH (ref 11.5–15.5)
WBC: 16.9 10*3/uL — ABNORMAL HIGH (ref 4.0–10.5)
nRBC: 0.1 % (ref 0.0–0.2)

## 2022-07-22 MED ORDER — PREDNISONE 10 MG PO TABS: 10.0000 mg | ORAL_TABLET | Freq: Every day | ORAL | 0 refills | Status: DC

## 2022-07-22 MED ORDER — POLYETHYLENE GLYCOL 3350 17 G PO PACK: 17.0000 g | PACK | Freq: Every day | ORAL | 0 refills | Status: DC

## 2022-07-22 MED ORDER — PREDNISONE 20 MG PO TABS: 20.0000 mg | ORAL_TABLET | Freq: Every day | ORAL | Status: DC

## 2022-07-22 MED ORDER — PREGABALIN 300 MG PO CAPS: 300.0000 mg | ORAL_CAPSULE | Freq: Three times a day (TID) | ORAL | 0 refills | Status: DC

## 2022-07-22 NOTE — Care Management Important Message (Signed)
Important Message  Patient Details IM Letter given. Name: April [name] MRN: [id number] Date of Birth: 01/18/1971   Medicare Important Message Given:  Yes     Kerin Salen 07/22/2022, 11:27 AM

## 2022-07-22 NOTE — Progress Notes (Signed)
  Transition of Care [facility]) Screening Note   Patient Details  Name: April [name] Date of Birth: 10-21-1970   Transition of Care [facility]) CM/SW Contact:    Vassie Moselle, LCSW Phone Number: 07/22/2022, 11:56 AM    Transition of Care Department [facility]) has reviewed patient and no TOC needs have been identified at this time. We will continue to monitor patient advancement through interdisciplinary progression rounds. If new patient transition needs arise, please place a TOC consult.

## 2022-07-22 NOTE — Discharge Summary (Signed)
Physician Discharge Summary  April [name] [id number] DOB: [birth year] DOA: 07/19/2022  PCP: [name], MD  Admit date: 07/19/2022 Discharge date: 07/22/2022  Admitted From: Home Disposition:  Home  Discharge Condition:Stable CODE STATUS:FULL Diet recommendation: Heart Healthy  Brief/Interim Summary: Patient is a 52 year old female with history of chronic severe asthma,COPD, status post tracheostomy, OSA, morbid obesity, hypertension, depression, GERD, paroxysmal A-fib, diastolic CHF, hyperlipidemia, chronic hypoxic respiratory failure and uses 3 to 4 L of oxygen at night who presented with wheezing, shortness of breath.  On presentation, she was dyspneic, tachycardic, tachypneic.  Chest x-ray did not show pneumonia.  Found to be wheezing.  Admitted for the management of acute exacerbation.  Started on IV steroids.  Respiratory status improving, currently remains on baseline oxygen requirement.  Wheezing has resolved.  Medically stable for discharge home today with tapering dose of oral prednisone.  Following problems were addressed during the hospitalization:  Acute asthma/COPD exacerbation: Patient states she has both asthma and COPD.  She follows with Dr. Annamaria Boots, pulmonology .found to be wheezing on presentation.   Started on IV steroid.  Trelegy, Ruthe Mannan resumed.  Continue as needed bronchodilators.  Takes prednisone 20 mg daily at home as maintenance.continue with tapering prednisone on discharge.  Follow-up with Dr. Annamaria Boots as an outpatient.  She has a nebulizer machine at home   Chronic hypoxic respiratory failure/OSA: On 3 to 4 L of oxygen at night.  History of OSA.  Does not use CPAP   Hypokalemia: Supplemented and corrected   Paroxysmal A-fib: Currently in normal sinus rhythm.  Monitor on telemetry.  Continue Coreg.  On Eliquis for anticoagulation   Anxiety/depression: On Prozac   Chronic diastolic CHF: On Lasix, Coreg, clonidine.  Currently looks euvolemic   Hypertension: On  Coreg, Norvasc.  Continue.  Monitor blood pressure at home   Chronic leukocytosis: Present since 2009.   Most likely contributed by chronic use of steroids for her COPD/asthma.  Monitor WBC count as an outpatient   Morbid obesity: BMI 50.5.  On Ozempic, Synthyroid for weight loss   Smoker: Occasionally smokes when she is stressed.  Counseled for cessation     Discharge Diagnoses:  Principal Problem:   Acute asthma exacerbation Active Problems:   HTN (hypertension)   AF (paroxysmal atrial fibrillation) (HCC)   Chronic heart failure with preserved ejection fraction (HFpEF) (HCC)   Hypothyroidism   Anxiety and depression   Obstructive sleep apnea   Hyperlipidemia    Discharge Instructions  Discharge Instructions     Diet - low sodium heart healthy   Complete by: As directed    Discharge instructions   Complete by: As directed    1)Please take prescribed medications as instructed 2)Follow up with your PCP and pulmonologist as an outpatient 3)Stop smoking   Increase activity slowly   Complete by: As directed       Allergies as of 07/22/2022       Reactions   Other Shortness Of Breath, Other (See Comments)   Seasonal allergies- Wheezing   Contrast Media [iodinated Contrast Media] Itching, Other (See Comments)   CT contrast        Medication List     STOP taking these medications    valACYclovir 500 MG tablet Commonly known as: Valtrex       TAKE these medications    acetaminophen 500 MG tablet Commonly known as: TYLENOL Take 500-1,000 mg by mouth every 6 (six) hours as needed for mild pain or headache.   albuterol 108 (  90 Base) MCG/ACT inhaler Commonly known as: ProAir HFA Inhale 2 puffs into the lungs every 6 (six) hours as needed for wheezing or shortness of breath. What changed:  when to take this additional instructions   Aleve 220 MG tablet Generic drug: naproxen sodium Take 220-440 mg by mouth 2 (two) times daily as needed (for pain or  headaches).   amLODipine 10 MG tablet Commonly known as: NORVASC Take 1 tablet (10 mg total) by mouth daily.   apixaban 5 MG Tabs tablet Commonly known as: ELIQUIS Take 1 tablet (5 mg total) by mouth 2 (two) times daily.   baclofen 20 MG tablet Commonly known as: LIORESAL Take 20-40 mg by mouth 3 (three) times daily as needed for muscle spasms.   carvedilol 6.25 MG tablet Commonly known as: COREG TAKE 1 TABLET(6.25 MG) BY MOUTH TWICE DAILY WITH A MEAL What changed: See the new instructions.   clonazePAM 0.5 MG tablet Commonly known as: KlonoPIN 1 twice daily as needed for anxiety What changed:  how much to take how to take this when to take this additional instructions   cloNIDine 0.2 MG tablet Commonly known as: CATAPRES TAKE 1 TABLET(0.2 MG) BY MOUTH TWICE DAILY What changed: See the new instructions.   EPINEPHrine 0.3 mg/0.3 mL Soaj injection Commonly known as: EPI-PEN Inject 0.3 mg into the muscle as needed for anaphylaxis.   famotidine 40 MG tablet Commonly known as: PEPCID Take 40 mg by mouth daily in the afternoon.   FLUoxetine 20 MG capsule Commonly known as: PROZAC Take 1 capsule (20 mg total) by mouth daily. Take 1 capsule daily. What changed:  when to take this additional instructions   fluticasone 50 MCG/ACT nasal spray Commonly known as: FLONASE SHAKE LIQUID AND USE 1 SPRAY IN EACH NOSTRIL DAILY AS NEEDED FOR ALLERGIES OR RHINITIS What changed: See the new instructions.   furosemide 20 MG tablet Commonly known as: LASIX Take 1 tablet (20 mg total) by mouth daily.   ipratropium-albuterol 0.5-2.5 (3) MG/3ML Soln Commonly known as: DUONEB 1 neb every 6 hours if needed for asthma What changed:  how much to take how to take this when to take this reasons to take this additional instructions   levothyroxine 50 MCG tablet Commonly known as: SYNTHROID Take 1 tablet (50 mcg total) by mouth daily. What changed: when to take this   montelukast  10 MG tablet Commonly known as: SINGULAIR Take 1 tablet (10 mg total) by mouth at bedtime.   naloxone 4 MG/0.1ML Liqd nasal spray kit Commonly known as: NARCAN Place 1 spray into the nose once as needed (opioid overdose).   omeprazole 40 MG capsule Commonly known as: PRILOSEC Take 40 mg by mouth See admin instructions. Take 40 mg by mouth one to two times a day before meals   oxyCODONE-acetaminophen 10-325 MG tablet Commonly known as: PERCOCET Take 1 tablet by mouth 5 (five) times daily as needed for pain. What changed: when to take this   Ozempic (1 MG/DOSE) 4 MG/3ML Sopn Generic drug: Semaglutide (1 MG/DOSE) Inject 1 mg into the skin every Monday.   phentermine 15 MG capsule Take 15 mg by mouth daily.   polyethylene glycol 17 g packet Commonly known as: MIRALAX / GLYCOLAX Take 17 g by mouth daily. Start taking on: July 23, 2022   potassium chloride SA 20 MEQ tablet Commonly known as: KLOR-CON M TAKE 1 TABLET(20 MEQ) BY MOUTH DAILY What changed: See the new instructions.   predniSONE 20 MG tablet Commonly  known as: DELTASONE Take 1 tablet (20 mg total) by mouth daily with breakfast. Resume after finishing the tapering dose or prednisone What changed:  how much to take how to take this when to take this additional instructions   predniSONE 10 MG tablet Commonly known as: DELTASONE Take 1 tablet (10 mg total) by mouth daily with breakfast. Take 4 pills daily for 2 days then 3 pills daily for 3 days then resume 20 mg as you were taking before Start taking on: July 23, 2022 What changed: You were already taking a medication with the same name, and this prescription was added. Make sure you understand how and when to take each.   pregabalin 300 MG capsule Commonly known as: LYRICA Take 1 capsule (300 mg total) by mouth in the morning, at noon, and at bedtime for 7 days.   Tezspire 210 MG/1.91ML Soaj Generic drug: Tezepelumab-ekko Inject 210 mg into the skin every  28 (twenty-eight) days.   topiramate 200 MG tablet Commonly known as: TOPAMAX Take 200 mg by mouth at bedtime.   Trelegy Ellipta 200-62.5-25 MCG/ACT Aepb Generic drug: Fluticasone-Umeclidin-Vilant Inhale 1 puff into the lungs daily.   Vitamin D (Ergocalciferol) 1.25 MG (50000 UNIT) Caps capsule Commonly known as: DRISDOL Take 50,000 Units by mouth every Friday.   zolpidem 10 MG tablet Commonly known as: AMBIEN TAKE 1 TABLET BY MOUTH AT BEDTIME AS NEEDED FOR SLEEP What changed:  how much to take how to take this when to take this additional instructions        Follow-up Information     [name], MD. Schedule an appointment as soon as possible for a visit in 1 week(s).   Specialty: Family Medicine Contact information: [address] [place] [address] [place] [address] [phone]                Allergies  Allergen Reactions   Other Shortness Of Breath and Other (See Comments)    Seasonal allergies- Wheezing   Contrast Media [Iodinated Contrast Media] Itching and Other (See Comments)    CT contrast    Consultations: None   Procedures/Studies: DG Chest Port 1 View  Result Date: 07/19/2022 CLINICAL DATA:  Shortness of breath. EXAM: PORTABLE CHEST 1 VIEW COMPARISON:  Chest radiograph dated 07/01/2022. FINDINGS: No focal consolidation, pleural effusion, pneumothorax. The cardiac silhouette is within limits. No acute osseous pathology. IMPRESSION: No active disease. Electronically Signed   By: Anner Crete M.D.   On: 07/19/2022 17:50   CT Head Wo Contrast  Result Date: 07/01/2022 CLINICAL DATA:  Blunt trauma to the head with headaches, initial encounter EXAM: CT HEAD WITHOUT CONTRAST TECHNIQUE: Contiguous axial images were obtained from the base of the skull through the vertex without intravenous contrast. RADIATION DOSE REDUCTION: This exam was performed according to the departmental dose-optimization program which includes automated exposure  control, adjustment of the mA and/or kV according to patient size and/or use of iterative reconstruction technique. COMPARISON:  12/18/2016 FINDINGS: Brain: No evidence of acute infarction, hemorrhage, hydrocephalus, extra-axial collection or mass lesion/mass effect. Vascular: No hyperdense vessel or unexpected calcification. Skull: Normal. Negative for fracture or focal lesion. Sinuses/Orbits: No acute finding. Other: None. IMPRESSION: No acute intracranial abnormality noted. Electronically Signed   By: Inez Catalina M.D.   On: 07/01/2022 23:16   DG Chest Port 1 View  Result Date: 07/01/2022 CLINICAL DATA:  Shortness of breath EXAM: PORTABLE CHEST 1 VIEW COMPARISON:  05/01/2022 FINDINGS: Heart and mediastinal contours are within normal limits. No  focal opacities or effusions. No acute bony abnormality. IMPRESSION: No active disease. Electronically Signed   By: Rolm Baptise M.D.   On: 07/01/2022 20:11      Subjective: Patient seen and examined the bedside today.  Hemodynamically stable.  On baseline oxygen requirement.  No shortness of breath or cough.  Medically stable for discharge today with oral prednisone  Discharge Exam: Vitals:   07/22/22 0458 07/22/22 0821  BP: 122/62   Pulse: (!) 58   Resp: 18   Temp: 98.2 F (36.8 C)   SpO2: 100% 99%   Vitals:   07/21/22 1934 07/21/22 2130 07/22/22 0458 07/22/22 0821  BP: (!) 146/79  122/62   Pulse: (!) 58  (!) 58   Resp: 17  18   Temp: (!) 97.5 F (36.4 C)  98.2 F (36.8 C)   TempSrc: Oral  Oral   SpO2: 100% 99% 100% 99%  Weight:      Height:        General: Pt is alert, awake, not in acute distress, morbidly obese Cardiovascular: RRR, S1/S2 +, no rubs, no gallops Respiratory: Mild dimished air  sounds bilaterally, no wheezing, no rhonchi Abdominal: Soft, NT, ND, bowel sounds + Extremities: no edema, no cyanosis    The results of significant diagnostics from this hospitalization (including imaging, microbiology, ancillary and  laboratory) are listed below for reference.     Microbiology: Recent Results (from the past 240 hour(s))  Resp panel by RT-PCR (RSV, Flu A&B, Covid) Anterior Nasal Swab     Status: None   Collection Time: 07/19/22  5:32 PM   Specimen: Anterior Nasal Swab  Result Value Ref Range Status   SARS Coronavirus 2 by RT PCR NEGATIVE NEGATIVE Final    Comment: (NOTE) SARS-CoV-2 target nucleic acids are NOT DETECTED.  The SARS-CoV-2 RNA is generally detectable in upper respiratory specimens during the acute phase of infection. The lowest concentration of SARS-CoV-2 viral copies this assay can detect is 138 copies/mL. A negative result does not preclude SARS-Cov-2 infection and should not be used as the sole basis for treatment or other patient management decisions. A negative result may occur with  improper specimen collection/handling, submission of specimen other than nasopharyngeal swab, presence of viral mutation(s) within the areas targeted by this assay, and inadequate number of viral copies(<138 copies/mL). A negative result must be combined with clinical observations, patient history, and epidemiological information. The expected result is Negative.  Fact Sheet for Patients:  EntrepreneurPulse.com.au  Fact Sheet for Healthcare Providers:  IncredibleEmployment.be  This test is no t yet approved or cleared by the [place] FDA and  has been authorized for detection and/or diagnosis of SARS-CoV-2 by FDA under an Emergency Use Authorization (EUA). This EUA will remain  in effect (meaning this test can be used) for the duration of the COVID-19 declaration under Section 564(b)(1) of the Act, 21 U.S.C.section 360bbb-3(b)(1), unless the authorization is terminated  or revoked sooner.       Influenza A by PCR NEGATIVE NEGATIVE Final   Influenza B by PCR NEGATIVE NEGATIVE Final    Comment: (NOTE) The Xpert Xpress SARS-CoV-2/FLU/RSV plus assay is  intended as an aid in the diagnosis of influenza from Nasopharyngeal swab specimens and should not be used as a sole basis for treatment. Nasal washings and aspirates are unacceptable for Xpert Xpress SARS-CoV-2/FLU/RSV testing.  Fact Sheet for Patients: EntrepreneurPulse.com.au  Fact Sheet for Healthcare Providers: IncredibleEmployment.be  This test is not yet approved or cleared by the [place]  States FDA and has been authorized for detection and/or diagnosis of SARS-CoV-2 by FDA under an Emergency Use Authorization (EUA). This EUA will remain in effect (meaning this test can be used) for the duration of the COVID-19 declaration under Section 564(b)(1) of the Act, 21 U.S.C. section 360bbb-3(b)(1), unless the authorization is terminated or revoked.     Resp Syncytial Virus by PCR NEGATIVE NEGATIVE Final    Comment: (NOTE) Fact Sheet for Patients: EntrepreneurPulse.com.au  Fact Sheet for Healthcare Providers: IncredibleEmployment.be  This test is not yet approved or cleared by the [place] FDA and has been authorized for detection and/or diagnosis of SARS-CoV-2 by FDA under an Emergency Use Authorization (EUA). This EUA will remain in effect (meaning this test can be used) for the duration of the COVID-19 declaration under Section 564(b)(1) of the Act, 21 U.S.C. section 360bbb-3(b)(1), unless the authorization is terminated or revoked.  Performed at [facility], [place] [address] [place], [place] [address]      Labs: BNP (last 3 results) Recent Labs    01/07/22 2342 04/04/22 0535 04/28/22 0810  BNP 26.6 95.5 [id number]   Basic Metabolic Panel: Recent Labs  Lab 07/19/22 1750 07/20/22 0545  NA 132* 134*  K 3.1* 4.0  CL 99 103  CO2 21* 21*  GLUCOSE 77 168*  BUN 12 13  CREATININE 1.08* 0.85  CALCIUM 8.4* 8.3*   Liver Function Tests: No results for input(s): "AST", "ALT",  "ALKPHOS", "BILITOT", "PROT", "ALBUMIN" in the last 168 hours. No results for input(s): "LIPASE", "AMYLASE" in the last 168 hours. No results for input(s): "AMMONIA" in the last 168 hours. CBC: Recent Labs  Lab 07/19/22 1750 07/20/22 0545 07/21/22 0502 07/22/22 0455  WBC 23.6* 16.6* 20.4* 16.9*  NEUTROABS 17.4* 14.8*  --   --   HGB 12.3 10.9* 11.0* 10.6*  HCT 39.1 35.1* 35.6* 34.3*  MCV 83.5 83.0 83.2 83.5  PLT 469* 411* 458* 425*   Cardiac Enzymes: No results for input(s): "CKTOTAL", "CKMB", "CKMBINDEX", "TROPONINI" in the last 168 hours. BNP: Invalid input(s): "POCBNP" CBG: Recent Labs  Lab 07/21/22 0809 07/21/22 1256 07/21/22 1636 07/21/22 2201 07/22/22 0748  GLUCAP 138* 171* 134* 118* 99   D-Dimer No results for input(s): "DDIMER" in the last 72 hours. Hgb A1c Recent Labs    07/19/22 2138  HGBA1C 5.9*   Lipid Profile No results for input(s): "CHOL", "HDL", "LDLCALC", "TRIG", "CHOLHDL", "LDLDIRECT" in the last 72 hours. Thyroid function studies No results for input(s): "TSH", "T4TOTAL", "T3FREE", "THYROIDAB" in the last 72 hours.  Invalid input(s): "FREET3" Anemia work up No results for input(s): "VITAMINB12", "FOLATE", "FERRITIN", "TIBC", "IRON", "RETICCTPCT" in the last 72 hours. Urinalysis    Component Value Date/Time   COLORURINE YELLOW 10/23/2021 1826   APPEARANCEUR CLOUDY (A) 10/23/2021 1826   LABSPEC 1.027 10/23/2021 1826   PHURINE 5.0 10/23/2021 1826   GLUCOSEU NEGATIVE 10/23/2021 1826   GLUCOSEU NEG mg/dL 10/28/2007 2049   HGBUR NEGATIVE 10/23/2021 1826   BILIRUBINUR NEGATIVE 10/23/2021 1826   KETONESUR NEGATIVE 10/23/2021 1826   PROTEINUR NEGATIVE 10/23/2021 1826   UROBILINOGEN 1.0 11/21/2014 0707   NITRITE NEGATIVE 10/23/2021 1826   LEUKOCYTESUR LARGE (A) 10/23/2021 1826   Sepsis Labs Recent Labs  Lab 07/19/22 1750 07/20/22 0545 07/21/22 0502 07/22/22 0455  WBC 23.6* 16.6* 20.4* 16.9*   Microbiology Recent Results (from the past  240 hour(s))  Resp panel by RT-PCR (RSV, Flu A&B, Covid) Anterior Nasal Swab     Status: None   Collection  Time: 07/19/22  5:32 PM   Specimen: Anterior Nasal Swab  Result Value Ref Range Status   SARS Coronavirus 2 by RT PCR NEGATIVE NEGATIVE Final    Comment: (NOTE) SARS-CoV-2 target nucleic acids are NOT DETECTED.  The SARS-CoV-2 RNA is generally detectable in upper respiratory specimens during the acute phase of infection. The lowest concentration of SARS-CoV-2 viral copies this assay can detect is 138 copies/mL. A negative result does not preclude SARS-Cov-2 infection and should not be used as the sole basis for treatment or other patient management decisions. A negative result may occur with  improper specimen collection/handling, submission of specimen other than nasopharyngeal swab, presence of viral mutation(s) within the areas targeted by this assay, and inadequate number of viral copies(<138 copies/mL). A negative result must be combined with clinical observations, patient history, and epidemiological information. The expected result is Negative.  Fact Sheet for Patients:  EntrepreneurPulse.com.au  Fact Sheet for Healthcare Providers:  IncredibleEmployment.be  This test is no t yet approved or cleared by the [place] FDA and  has been authorized for detection and/or diagnosis of SARS-CoV-2 by FDA under an Emergency Use Authorization (EUA). This EUA will remain  in effect (meaning this test can be used) for the duration of the COVID-19 declaration under Section 564(b)(1) of the Act, 21 U.S.C.section 360bbb-3(b)(1), unless the authorization is terminated  or revoked sooner.       Influenza A by PCR NEGATIVE NEGATIVE Final   Influenza B by PCR NEGATIVE NEGATIVE Final    Comment: (NOTE) The Xpert Xpress SARS-CoV-2/FLU/RSV plus assay is intended as an aid in the diagnosis of influenza from Nasopharyngeal swab specimens and should  not be used as a sole basis for treatment. Nasal washings and aspirates are unacceptable for Xpert Xpress SARS-CoV-2/FLU/RSV testing.  Fact Sheet for Patients: EntrepreneurPulse.com.au  Fact Sheet for Healthcare Providers: IncredibleEmployment.be  This test is not yet approved or cleared by the [place] FDA and has been authorized for detection and/or diagnosis of SARS-CoV-2 by FDA under an Emergency Use Authorization (EUA). This EUA will remain in effect (meaning this test can be used) for the duration of the COVID-19 declaration under Section 564(b)(1) of the Act, 21 U.S.C. section 360bbb-3(b)(1), unless the authorization is terminated or revoked.     Resp Syncytial Virus by PCR NEGATIVE NEGATIVE Final    Comment: (NOTE) Fact Sheet for Patients: EntrepreneurPulse.com.au  Fact Sheet for Healthcare Providers: IncredibleEmployment.be  This test is not yet approved or cleared by the [place] FDA and has been authorized for detection and/or diagnosis of SARS-CoV-2 by FDA under an Emergency Use Authorization (EUA). This EUA will remain in effect (meaning this test can be used) for the duration of the COVID-19 declaration under Section 564(b)(1) of the Act, 21 U.S.C. section 360bbb-3(b)(1), unless the authorization is terminated or revoked.  Performed at [facility], [place] [address], [place], [place] [address]     Please note: You were cared for by a hospitalist during your hospital stay. Once you are discharged, your primary care physician will handle any further medical issues. Please note that NO REFILLS for any discharge medications will be authorized once you are discharged, as it is imperative that you return to your primary care physician (or establish a relationship with a primary care physician if you do not have one) for your post hospital discharge needs so that they can  reassess your need for medications and monitor your lab values.    Time coordinating discharge: 40  minutes  SIGNED:   Shelly Coss, MD  [facility] 07/22/2022, 11:21 AM Pager [phone]  If 7PM-7AM, please contact night-coverage [email or web address] Password [email or web address]

## 2022-07-24 ENCOUNTER — Telehealth: Payer: Self-pay

## 2022-07-24 NOTE — Transitions of Care (Post Inpatient/ED Visit) (Signed)
   07/24/2022  Name: April [name] MRN: [id number] DOB: 30-Oct-1970  Today's TOC FU Call Status: Today's TOC FU Call Status:: Unsuccessul Call (1st Attempt) Unsuccessful Call (1st Attempt) Date: 07/24/22  Attempted to reach the patient regarding the most recent Inpatient/ED visit.  Follow Up Plan: Additional outreach attempts will be made to reach the patient to complete the Transitions of Care (Post Inpatient/ED visit) call.   Signature Eden Lathe, RN

## 2022-07-25 ENCOUNTER — Telehealth: Payer: Self-pay

## 2022-07-25 NOTE — Transitions of Care (Post Inpatient/ED Visit) (Signed)
   07/25/2022  Name: ANJI TERMAN MRN: [id number] DOB: [birth year]  Today's TOC FU Call Status: Today's TOC FU Call Status:: Successful TOC FU Call Competed Unsuccessful Call (1st Attempt) Date: 07/24/22 [facility] FU Call Complete Date: 07/25/22  Transition Care Management Follow-up Telephone Call Date of Discharge: 07/22/22 Discharge Facility: [facility]) Type of Discharge: Inpatient Admission Primary Inpatient Discharge Diagnosis:: acute asthma exacerbation. How have you been since you were released from the hospital?: Better (regarding the neurology referral, I explained  that the neurology offices will not see her because they don't do 3rd party billing.  the patient then said she is not involving an attorney.  I will check with our referral coord. again about the referral.) Any questions or concerns?: No  Items Reviewed: Did you receive and understand the discharge instructions provided?: Yes Medications obtained and verified?: Yes (Medications Reviewed) (She said she has all of her medications as well as a nebulizer.) Any new allergies since your discharge?: No Dietary orders reviewed?: Yes Type of Diet Ordered:: heart healthy Do you have support at home?: Yes (She said that next week she will be moving into a motel as part of the TCL program that is assisting her with obtaining permanent housing.)  Home Care and Equipment/Supplies: [place] Ordered?: No Any new equipment or medical supplies ordered?: No (She has home O2 that she said she only uses as needed.  She said that she doesn't even need it every night.)  Functional Questionnaire: Do you need assistance with bathing/showering or dressing?: No Do you need assistance with meal preparation?: No Do you need assistance with eating?: No Do you have difficulty maintaining continence: No Do you need assistance with getting out of bed/getting out of a chair/moving?: No Do you have difficulty managing or taking your  medications?: No  Follow up appointments reviewed: PCP Follow-up appointment confirmed?: Yes Date of PCP follow-up appointment?: 08/21/22 Follow-up Provider: Dr Margarita Rana Do you need transportation to your follow-up appointment?: No Do you understand care options if your condition(s) worsen?: Yes-patient verbalized understanding    SIGNATURE Eden Lathe, RN

## 2022-07-26 ENCOUNTER — Ambulatory Visit: Payer: Medicaid Other

## 2022-07-29 ENCOUNTER — Ambulatory Visit (INDEPENDENT_AMBULATORY_CARE_PROVIDER_SITE_OTHER): Payer: 59

## 2022-07-29 ENCOUNTER — Encounter: Payer: Self-pay | Admitting: Internal Medicine

## 2022-07-29 VITALS — BP 110/73 | HR 71 | Temp 98.1°F | Resp 20 | Ht 66.0 in | Wt 307.0 lb

## 2022-07-29 DIAGNOSIS — J4552 Severe persistent asthma with status asthmaticus: Secondary | ICD-10-CM

## 2022-07-29 DIAGNOSIS — J4489 Other specified chronic obstructive pulmonary disease: Secondary | ICD-10-CM | POA: Diagnosis not present

## 2022-07-29 MED ORDER — TEZEPELUMAB-EKKO 210 MG/1.91ML ~~LOC~~ SOSY
210.0000 mg | PREFILLED_SYRINGE | Freq: Once | SUBCUTANEOUS | Status: AC
  Administered 2022-07-29: 210 mg via SUBCUTANEOUS

## 2022-07-29 NOTE — Progress Notes (Signed)
Diagnosis: Asthma  Provider:  Marshell Garfinkel MD  Procedure: Injection  Tezspire (Tezepelumab), Dose: 210 mg, Site: subcutaneous, Number of injections: 1  Post Care:  N/A  Discharge: Condition: Good, Destination: Home . AVS Declined  Performed by:  Adelina Mings, LPN

## 2022-07-30 ENCOUNTER — Other Ambulatory Visit: Payer: Self-pay | Admitting: Nurse Practitioner

## 2022-07-30 MED ORDER — GABAPENTIN 300 MG PO CAPS: 300.0000 mg | ORAL_CAPSULE | Freq: Three times a day (TID) | ORAL | 3 refills | Status: DC

## 2022-07-30 NOTE — Progress Notes (Signed)
Medicaid did not cover lyrica-substituted gabapentin instead

## 2022-08-02 ENCOUNTER — Telehealth: Payer: Self-pay

## 2022-08-02 NOTE — Telephone Encounter (Signed)
Patient has been scheduled for a telephone visit to discuss concerns.   Copied from CRM [id number]. Topic: General - Other >> Aug 02, 2022  9:44 AM Epimenio Foot F wrote: Reason for CRM: Anice Paganini an Occupational Therapist with [facility] is calling wanting to know can Chuck pads/absorbent pads be obtained through the [employer] as well as pull socks. Please advise. [phone].

## 2022-08-05 ENCOUNTER — Telehealth: Payer: Self-pay | Admitting: Internal Medicine

## 2022-08-05 MED ORDER — ZOLPIDEM TARTRATE 10 MG PO TABS: ORAL_TABLET | ORAL | 5 refills | Status: DC

## 2022-08-05 NOTE — Telephone Encounter (Signed)
Patient states needs refill for Ambien 10 mg. Pharmacy is [facility] [address]. Patient phone number is [phone].

## 2022-08-05 NOTE — Telephone Encounter (Signed)
Ambien refilled

## 2022-08-05 NOTE — Telephone Encounter (Signed)
Advised patient April [name] has been sent to pharmacy. NFN

## 2022-08-06 ENCOUNTER — Telehealth (HOSPITAL_BASED_OUTPATIENT_CLINIC_OR_DEPARTMENT_OTHER): Payer: 59 | Admitting: Family Medicine

## 2022-08-06 DIAGNOSIS — N39498 Other specified urinary incontinence: Secondary | ICD-10-CM

## 2022-08-06 MED ORDER — SOLIFENACIN SUCCINATE 5 MG PO TABS
5.0000 mg | ORAL_TABLET | Freq: Every day | ORAL | 1 refills | Status: DC
Start: 2022-08-06 — End: 2023-02-05

## 2022-08-06 NOTE — Progress Notes (Signed)
Virtual Visit via Telephone Note  I connected with April [name], on 08/06/2022 at 1:37 PM by telephone and verified that I am speaking with the correct person using two identifiers.   Consent: I discussed the limitations, risks, security and privacy concerns of performing an evaluation and management service by telephone and the availability of in person appointments. I also discussed with the patient that there may be a patient responsible charge related to this service. The patient expressed understanding and agreed to proceed.   Location of Patient: Home  Location of Provider: Clinic   Persons participating in Telemedicine visit: April [name] Dr. Alvis Lemmings     History of Present Illness: April [name] is a 52 y.o. year old female with a history of morbid obesity, major depressive disorder, hypertension, COPD/ Asthma , paroxysmal atrial fibrillation obstructive sleep apnea (not compliant with CPAP) with multiple hospitalizations for asthma exacerbation/COPD , steroid-induced diabetes  (previously on metformin) .   She Complains of having accidents at night and wets her bed so she will need chucks for this.  She does not wear depends. She Complains of when she coughs she leaks urine but she has no urge incontinence. Past Medical History:  Diagnosis Date   Acanthosis nigricans    Anxiety    Arthritis    "knees" (04/28/2017)   Asthma    Followed by Dr. Maple Hudson (pulmonology); receives every other week omalizumab injections; has frequent exacerbations   Back pain    Chronic diastolic CHF (congestive heart failure) (HCC) 01/17/2017   COPD (chronic obstructive pulmonary disease) (HCC)    PFTs in 2002, FEV1/FVC 65, no post bronchodilater test done   Depression    GERD (gastroesophageal reflux disease)    Headache(784.0)    "q couple days" (04/28/2017)   Helicobacter pylori (H. pylori) infection    Hypertension, essential    Insomnia    Joint pain    Lower extremity edema     Menorrhagia    Morbid obesity (HCC)    OSA on CPAP    Sleep study 2008 - mild OSA, not enough events to titrate CPAP; wears CPAP now/pt on 04/28/2017   Pneumonia X 1   Prediabetes    Rheumatoid arthritis (HCC)    Seasonal allergies    Shortness of breath    Tobacco user    Vitamin D deficiency    Allergies  Allergen Reactions   Other Shortness Of Breath and Other (See Comments)    Seasonal allergies- Wheezing   Contrast Media [Iodinated Contrast Media] Itching and Other (See Comments)    CT contrast    Current Outpatient Medications on File Prior to Visit  Medication Sig Dispense Refill   gabapentin (NEURONTIN) 300 MG capsule Take 1 capsule (300 mg total) by mouth 3 (three) times daily. 90 capsule 3   acetaminophen (TYLENOL) 500 MG tablet Take 500-1,000 mg by mouth every 6 (six) hours as needed for mild pain or headache.     albuterol (PROAIR HFA) 108 (90 Base) MCG/ACT inhaler Inhale 2 puffs into the lungs every 6 (six) hours as needed for wheezing or shortness of breath. (Patient taking differently: Inhale 2 puffs into the lungs See admin instructions. Inhale 2 puffs into the lungs every 2-4 hours as needed for shortness of breath or wheezing) 8.5 g 11   ALEVE 220 MG tablet Take 220-440 mg by mouth 2 (two) times daily as needed (for pain or headaches).     amLODipine (NORVASC) 10 MG tablet Take  1 tablet (10 mg total) by mouth daily. 90 tablet 1   apixaban (ELIQUIS) 5 MG TABS tablet Take 1 tablet (5 mg total) by mouth 2 (two) times daily. 180 tablet 1   baclofen (LIORESAL) 20 MG tablet Take 20-40 mg by mouth 3 (three) times daily as needed for muscle spasms.     carvedilol (COREG) 6.25 MG tablet TAKE 1 TABLET(6.25 MG) BY MOUTH TWICE DAILY WITH A MEAL (Patient taking differently: Take 6.25 mg by mouth daily.) 180 tablet 1   clonazePAM (KLONOPIN) 0.5 MG tablet 1 twice daily as needed for anxiety (Patient taking differently: Take 0.5 mg by mouth in the morning and at bedtime.) 60 tablet 5    cloNIDine (CATAPRES) 0.2 MG tablet TAKE 1 TABLET(0.2 MG) BY MOUTH TWICE DAILY (Patient taking differently: Take 0.2 mg by mouth 2 (two) times daily.) 180 tablet 1   EPINEPHrine 0.3 mg/0.3 mL IJ SOAJ injection Inject 0.3 mg into the muscle as needed for anaphylaxis. 1 each 5   famotidine (PEPCID) 40 MG tablet Take 40 mg by mouth daily in the afternoon.     FLUoxetine (PROZAC) 20 MG capsule Take 1 capsule (20 mg total) by mouth daily. Take 1 capsule daily. (Patient taking differently: Take 20 mg by mouth in the morning.) 30 capsule 1   fluticasone (FLONASE) 50 MCG/ACT nasal spray SHAKE LIQUID AND USE 1 SPRAY IN EACH NOSTRIL DAILY AS NEEDED FOR ALLERGIES OR RHINITIS (Patient taking differently: Place 1 spray into both nostrils daily.) 16 g 0   Fluticasone-Umeclidin-Vilant (TRELEGY ELLIPTA) 200-62.5-25 MCG/ACT AEPB Inhale 1 puff into the lungs daily. 60 each 5   furosemide (LASIX) 20 MG tablet Take 1 tablet (20 mg total) by mouth daily. 90 tablet 0   ipratropium-albuterol (DUONEB) 0.5-2.5 (3) MG/3ML SOLN 1 neb every 6 hours if needed for asthma (Patient taking differently: Take 3 mLs by nebulization every 4 (four) hours as needed (shortness of breath).) 75 mL 12   levothyroxine (SYNTHROID) 50 MCG tablet Take 1 tablet (50 mcg total) by mouth daily. (Patient taking differently: Take 50 mcg by mouth daily before breakfast.) 90 tablet 1   montelukast (SINGULAIR) 10 MG tablet Take 1 tablet (10 mg total) by mouth at bedtime. 30 tablet 2   naloxone (NARCAN) nasal spray 4 mg/0.1 mL Place 1 spray into the nose once as needed (opioid overdose).     omeprazole (PRILOSEC) 40 MG capsule Take 40 mg by mouth See admin instructions. Take 40 mg by mouth one to two times a day before meals     oxyCODONE-acetaminophen (PERCOCET) 10-325 MG tablet Take 1 tablet by mouth 5 (five) times daily as needed for pain. (Patient taking differently: Take 1 tablet by mouth 4 (four) times daily.) 150 tablet 0   phentermine 15 MG capsule  Take 15 mg by mouth daily.     polyethylene glycol (MIRALAX / GLYCOLAX) 17 g packet Take 17 g by mouth daily. 14 each 0   potassium chloride SA (KLOR-CON M) 20 MEQ tablet TAKE 1 TABLET(20 MEQ) BY MOUTH DAILY (Patient taking differently: Take 20 mEq by mouth daily.) 90 tablet 3   predniSONE (DELTASONE) 10 MG tablet Take 1 tablet (10 mg total) by mouth daily with breakfast. Take 4 pills daily for 2 days then 3 pills daily for 3 days then resume 20 mg as you were taking before 12 tablet 0   predniSONE (DELTASONE) 20 MG tablet Take 1 tablet (20 mg total) by mouth daily with breakfast. Resume after finishing the  tapering dose or prednisone     Semaglutide, 1 MG/DOSE, (OZEMPIC, 1 MG/DOSE,) 4 MG/3ML SOPN Inject 1 mg into the skin every Monday.     Tezepelumab-ekko (TEZSPIRE) 210 MG/1.91ML SOAJ Inject 210 mg into the skin every 28 (twenty-eight) days. 1.91 mL 5   topiramate (TOPAMAX) 200 MG tablet Take 200 mg by mouth at bedtime.     Vitamin D, Ergocalciferol, (DRISDOL) 1.25 MG (50000 UNIT) CAPS capsule Take 50,000 Units by mouth every Friday.     zolpidem (AMBIEN) 10 MG tablet TAKE 1 TABLET BY MOUTH AT BEDTIME AS NEEDED FOR SLEEP 30 tablet 5   [DISCONTINUED] mometasone-formoterol (DULERA) 200-5 MCG/ACT AERO Inhale 2 puffs into the lungs 2 (two) times daily. Dx: Asthma 8.8 g 3   Current Facility-Administered Medications on File Prior to Visit  Medication Dose Route Frequency Provider Last Rate Last Admin   tezepelumab-ekko (TEZSPIRE) 210 MG/1.91ML syringe 210 mg  210 mg Subcutaneous Once Jetty DuhamelYoung, Clinton D, MD        ROS: See HPI  Observations/Objective: Awake, alert, oriented x3 Not in acute distress Normal mood      Latest Ref Rng & Units 07/20/2022    5:45 AM 07/19/2022    5:50 PM 07/01/2022    8:49 PM  CMP  Glucose 70 - 99 mg/dL 161168  77  96   BUN 6 - 20 mg/dL 13  12  12    Creatinine 0.44 - 1.00 mg/dL 0.960.85  0.451.08  4.090.86   Sodium 135 - 145 mmol/L 134  132  136   Potassium 3.5 - 5.1 mmol/L 4.0   3.1  3.5   Chloride 98 - 111 mmol/L 103  99  106   CO2 22 - 32 mmol/L 21  21  21    Calcium 8.9 - 10.3 mg/dL 8.3  8.4  8.7     Lipid Panel     Component Value Date/Time   CHOL 192 10/16/2021 0506   CHOL 238 (H) 03/30/2020 1128   TRIG 51 10/16/2021 0506   HDL 99 10/16/2021 0506   HDL 45 03/30/2020 1128   CHOLHDL 1.9 10/16/2021 0506   VLDL 10 10/16/2021 0506   LDLCALC 83 10/16/2021 0506   LDLCALC 167 (H) 03/30/2020 1128   LABVLDL 26 03/30/2020 1128    Lab Results  Component Value Date   HGBA1C 5.9 (H) 07/19/2022    Assessment and Plan: 1. Other urinary incontinence Will send prescription for chucks to [facility] Counseled on Kegel exercises Begin Vesicare - solifenacin (VESICARE) 5 MG tablet; Take 1 tablet (5 mg total) by mouth daily.  Dispense: 90 tablet; Refill: 1   Follow Up Instructions: Keep previously scheduled appointment.   I discussed the assessment and treatment plan with the patient. The patient was provided an opportunity to ask questions and all were answered. The patient agreed with the plan and demonstrated an understanding of the instructions.   The patient was advised to call back or seek an in-person evaluation if the symptoms worsen or if the condition fails to improve as anticipated.     I provided 11 minutes total of non-face-to-face time during this encounter.   Hoy RegisterEnobong Sarra Rachels, MD, FAAFP. [facility] [place], [place] [phone]   08/06/2022, 1:37 PM

## 2022-08-07 ENCOUNTER — Telehealth: Payer: Self-pay

## 2022-08-07 NOTE — Telephone Encounter (Signed)
Message received that patient needs to have empty O2 tanks picked up.  I called patient [phone] to inquire if she has contacted the O2 provider. Message left with call back requested

## 2022-08-08 NOTE — Telephone Encounter (Signed)
Patient returned my call.  She said she is currently at the [employer] [facility]. as part of the TCL program as she awaits permanent housing.  She said she has her room O2 concentrator with her but does not have room for the extra tanks that were in her previous home.  She said she has tried to contact [employer] to have them pick up the tanks but she has not been successful.  She asked if I would assist her,  She said that [employer] can contact her and she will meet them at her old address where the tanks are: [address], [place]. I told her that I would contact [employer].  I spoke to Tanisha/ [employer] : [phone] and explained the need to pick up O2 tanks at patient's old address.  I confirmed with her the address where the tanks are and I also confirmed patient's phone number.  Gala Murdoch said they would contact the patient.

## 2022-08-09 ENCOUNTER — Other Ambulatory Visit: Payer: Self-pay | Admitting: Internal Medicine

## 2022-08-09 ENCOUNTER — Telehealth: Payer: Self-pay | Admitting: *Deleted

## 2022-08-09 NOTE — Telephone Encounter (Signed)
Pt had not been able to complete the Cologuard test and requested referral to GI for colonoscopy, which Dr. Alvis Lemmings made on 07/19/22. Referral notes indicated [facility] office called pt and left message for her to call them for appt on 07/22/22. Pt contacted again today to give her the [facility] phone number so that she could call office to schedule her initial appt as she had requested. Pt repeated back the correct [facility] phone number and thanked caller for her help.

## 2022-08-12 ENCOUNTER — Encounter: Payer: Self-pay | Admitting: Internal Medicine

## 2022-08-12 NOTE — Assessment & Plan Note (Signed)
Exacerbations last month, despite Tezspire. Allows herself to run out of meds- finances? Home environment with mold also not good- discussed. Plan- try reducing prednisone to 20/ 10 every other day. Continue Tezspire.

## 2022-08-12 NOTE — Assessment & Plan Note (Signed)
I advised her to hold on to O2, even if not using right now. She is so unstable, she is likely to need it again soon.

## 2022-08-13 ENCOUNTER — Telehealth: Payer: Self-pay

## 2022-08-13 ENCOUNTER — Other Ambulatory Visit (HOSPITAL_COMMUNITY): Payer: Self-pay

## 2022-08-13 NOTE — Telephone Encounter (Signed)
Copied from CRM [id number]. Topic: General - Other >> Aug 13, 2022  3:14 PM Ja-Kwan M wrote: Reason for CRM: Pt requests that Erskine Squibb return her call. Cb# [phone]

## 2022-08-14 NOTE — Telephone Encounter (Signed)
Call returned to patient.  She said her asthma has been " acting up" but she is taking the prednisone and is feeling better today. She hopes that she does not need to return to the hospital.  She has not been able to reach [employer] to arrange pick up of her O2 tanks in her old apartment. I confirmed the number she has for [employer] and instructed her to keep trying   She said she met with her caseworker from [employer] and submitted the application for housing and hopes to have a place of her own soon. She continues to stay at a motel funded by the [employer]

## 2022-08-15 ENCOUNTER — Other Ambulatory Visit (HOSPITAL_COMMUNITY): Payer: Self-pay

## 2022-08-21 ENCOUNTER — Encounter: Payer: Self-pay | Admitting: Family Medicine

## 2022-08-21 ENCOUNTER — Ambulatory Visit: Payer: 59 | Attending: Family Medicine | Admitting: Family Medicine

## 2022-08-21 ENCOUNTER — Telehealth: Payer: Self-pay

## 2022-08-21 ENCOUNTER — Telehealth: Payer: Self-pay | Admitting: Licensed Clinical Social Worker

## 2022-08-21 VITALS — BP 125/73 | HR 69 | Ht 66.0 in | Wt 321.6 lb

## 2022-08-21 DIAGNOSIS — I480 Paroxysmal atrial fibrillation: Secondary | ICD-10-CM | POA: Diagnosis not present

## 2022-08-21 DIAGNOSIS — F331 Major depressive disorder, recurrent, moderate: Secondary | ICD-10-CM

## 2022-08-21 DIAGNOSIS — J4541 Moderate persistent asthma with (acute) exacerbation: Secondary | ICD-10-CM

## 2022-08-21 DIAGNOSIS — E038 Other specified hypothyroidism: Secondary | ICD-10-CM

## 2022-08-21 DIAGNOSIS — I152 Hypertension secondary to endocrine disorders: Secondary | ICD-10-CM

## 2022-08-21 DIAGNOSIS — E1159 Type 2 diabetes mellitus with other circulatory complications: Secondary | ICD-10-CM | POA: Diagnosis not present

## 2022-08-21 MED ORDER — APIXABAN 5 MG PO TABS: 5.0000 mg | ORAL_TABLET | Freq: Two times a day (BID) | ORAL | 1 refills | Status: DC

## 2022-08-21 MED ORDER — CARVEDILOL 6.25 MG PO TABS: 6.2500 mg | ORAL_TABLET | Freq: Two times a day (BID) | ORAL | 1 refills | Status: DC

## 2022-08-21 MED ORDER — POTASSIUM CHLORIDE CRYS ER 20 MEQ PO TBCR: 20.0000 meq | EXTENDED_RELEASE_TABLET | Freq: Every day | ORAL | 1 refills | Status: DC

## 2022-08-21 MED ORDER — CLONIDINE HCL 0.2 MG PO TABS: 0.2000 mg | ORAL_TABLET | Freq: Two times a day (BID) | ORAL | 1 refills | Status: DC

## 2022-08-21 MED ORDER — AMLODIPINE BESYLATE 10 MG PO TABS: 10.0000 mg | ORAL_TABLET | Freq: Every day | ORAL | 1 refills | Status: DC

## 2022-08-21 NOTE — Telephone Encounter (Signed)
I met with the patient when she was in the clinic today.  She explained that [employer] has still not picked up the O2 tanks from her old apartment. She is currently at [employer] awaiting permanent housing.    I called Renee/ [employer] to inquire about the O2 tank pick up. She said that they are aware of the need to pick up the tanks.  They are down a driver and re-scheduling calls. She said they will contact the patient when they have  driver available.  She confirmed patient's phone number with me   I then shared this information with the patient.   The patient said that she continues to work with her caseworker with the TCL program.  She has move in date of 08/27/2022 for her new apartment. She explained that she has some issues with her Medicaid and has been working with DSS.  I instructed her to call me with any questions/ concerns

## 2022-08-21 NOTE — Telephone Encounter (Signed)
Met April [name] during a warm hand off. Patient wanted resources on psychiatrist and therapy in one place. I was able to highlight the resources that I had to support this. April [name] will follow up if she needs more resources.   Counseling Resources   [email or web address]  [facility] [address] [place], [place] [address] [phone] or [phone] Walk-in urgent care 24/7 for anyone  For [facility] ONLY New patient assessments and therapy walk-ins: Monday and Wednesday 8am-11am First and second Friday 1pm-5pm New patient psychiatry and medication management walk-ins: Mondays, Wednesdays, Thursdays, Fridays 8am-11am No psychiatry walk-ins on Tuesday   *Accepts all insurance and uninsured for Urgent Care needs *Accepts Medicaid and uninsured for outpatient treatment   [facility] (Therapy and psychiatry) [facility] (near [employer]) [address], [address] [place], [place] [address] [phone] Fax: [phone] (INSURANCE REQUIRED-MEDICAID ACCEPTED)   [facility] Address: Four Locations  -[address] [place], [place], [place]                                 Phone: [phone] -[address], [place], [place]         Phone: [phone] -[address], [place], [place]                      Phone: [phone] -[address], [place], [place]         Phone: [phone] Age Range: Children, Adolescents, and Adults Specialty Areas: Depression, Anxiety, ADHD, Substance Abuse, Bipolar Disorder, etc.  [facility] Address: [address], [place], [place] Phone: [phone] Age Range: Children, Adults, and Elders Specialty Areas: Couple, Family, Group, Individual     [facility] Address: [address] [place], [place] Phone: [phone] Age Range: Children, Adolescents, and  Adults Specialty Areas: Individual, Family and Couples Therapy, and Substance Abuse  [facility] Address: [address] [place], [place] [address] Phone: [phone] Age Range: Adolescents/Teenagers and Adults Specialty Areas: Individual, Family, Couples, and Group Counseling   [facility] Address: [address] [place], [place] Phone: [phone] Age Range: Children and Adults Specialty Areas: Individual, Group, and Family counseling to people who have experienced domestic violence or sexual assault  [facility] Address: [address] [place], [place] [address] Phone: [phone] Age Range: Adults & Children (Ages 3+) Specialty Areas: Mental Health and Substance Abuse Counseling Services  [facility] Address: [address] [place], [place], [place] [address] Phone: [phone]             Age Range: All Ages  Specialty Areas: Common mental health diagnoses such as Anxiety, Depression, ADD/ADHD, Bipolar, and PTSD, Substance Abuse Evaluations and Counseling   [facility] at [facility] [address] [place] [address] [place],  [place]  [address] [phone] Call for appointment  [facility] (Therapy only)  [facility] [address] [place], [place] [address] Monday - Friday: 8:30 a.m.-12 p.m. / 1 p.m.-2:30 p.m.  [facility] [address], [place], [place] [address] Monday-Friday: 8:30 a.m.-12 p.m. / 2-3:30 p.m. (INSURANCE REQUIRED -MEDICAID ACCEPTED) They do offer a sliding fee scale $20-$30/session   [facility] [address] [place], [place] [address] Phone: [phone]  [facility] [address] [place] [address]  Phone: [phone] (Does not accept Medicaid) (only one provider accepts Medicare)  [facility] [address] (at [employer], [place]  [phone] (Accepts Medicaid and Medicare)  [facility]) [address] [place] [address]  [place], [place] [address]  Phone: [phone]  [address] [place], [place] [address] Phone: [phone] [facility] Medicaid) [facility] (Therapy only)  [address] [place], [place] [address] Phone: [phone]   [facility] (Therapy only)  [address] [place], [place] [address] Phone: [phone] [facility] & [facility])   [facility] [address], Suite [place], [place] [address] Phone: [phone] (Accepts [place] Health Choice) [facility] [phone] [place], [place] [address] Phone: [phone] [facility]) [facility] (Psychiatry only)  [phone] [address], [place], [place] [address] (Accepts Medicaid and Medicare) [facility] (Psychiatry and therapy)  [address] [place], [place] [address] [phone] [facility] Medicare) [facility] (psychiatry and therapy) [address], [place], [place] [address] [phone]  [facility] at [address], [place], [place] [address] [phone] Accept [address] [place], [place], [place], [place],  and the following types of Medicaid; Alliance, [place], Partners, [place], [place] Health Choice, [employer], Healthy [place], [place], and [place], as well as offering a Careers adviser and private payment options. Provides In-Office Appointments, Virtual Appointments, and Phone Consultations Offers medication management for ages 64 years old and up, including,  Medication Management for Suboxone and [facility] [phone] [address], [place], [place] [address] (Accepts Medicaid and Medicare)         19.  [facility] (therapy only)  [address] [place], [place] [address]             [phone] (Accepts medicare) 20. [facility]  (Suboxone and methodone) [phone] [address] [place], [place] [address] To Be Eligible for Opioid Treatment at [facility] you must be at least 50 years of age you have already tried other interventions that were not successful such as opioid detox, inpatient rehab for opioids, or outpatient counseling specifically for opioid dependency your [facility] drug test must be completely free of benzodiazepines (klonopin, xanax, valium, ativan, or other benz) you have reliable transportation to the [facility] clinic in [place] you recognize that counseling is a critical component of [facility]' Opioid Program and you agree to attend all required counseling sessions you are committed to total drug abstinence and will conscientiously strive to remain free of alcohol, marijuana, and other illicit substances while in treatment you desire a peaceful treatment atmosphere in which personal responsibility and respect toward staff and clients is the norm   21. [facility] [address] [place], [place], [place] [address] Offers SAIOP (Substance Abuse Intensive Outpatient Program) [phone] 22. [facility] [address] [place], [place] [address] [phone] (Accepts medicare)  For those who are tech savvy, go on psychology today, type in your local city (i.e. [place]. [place]) and specify your insurance at the top of the screen after you search. (Medicaid if needed). You can also specify whether you are interested in therapy and psychiatry.  [email or web address]

## 2022-08-21 NOTE — Patient Instructions (Signed)
Asthma Action Plan, Adult An asthma action plan helps you understand how to manage your asthma and what to do when you have an asthma attack. The action plan is a color-coded plan that lists the symptoms that indicate whether or not your condition is under control and what actions to take. If you have symptoms in the green zone, you are doing well. If you have symptoms in the yellow zone, you are having problems. If you have symptoms in the red zone, you need medical care right away. Follow the plan that you and your health care provider develop. Review your plan with your health care provider at each visit. What triggers your asthma? Knowing the things that can trigger an asthma attack or make your asthma symptoms worse is very important. Talk to your health care provider about your asthma triggers and how to avoid them. Record your known asthma triggers here: _______________ What is your personal best peak flow reading? If you use a peak flow meter, determine your personal best reading. Record it here: _______________ Green zone This zone means that your asthma is under control. You may not have any symptoms while you are in the green zone. This means that you: Have no coughing or wheezing, even while you are working or playing. Sleep through the night. Are breathing well. Have a peak flow reading that is above __________ (80% of your personal best or greater). If you are in the green zone, continue to manage your asthma as directed. Take these medicines every day: Controller medicine and dosage: _______________ Controller medicine and dosage: _______________ Controller medicine and dosage: _______________ Controller medicine and dosage: _______________ Before exercise, use this reliever or rescue medicine: _______________ Call your health care provider if you are using a reliever or rescue medicine more than 2-3 times a week. Yellow zone Symptoms in this zone mean that your condition may  be getting worse. You may have symptoms that interfere with exercise, are noticeably worse after exposure to triggers, or are worse at the first sign of a cold (upper respiratory infection). These may include: Waking from sleep. Coughing, especially at night or first thing in the morning. Mild wheezing. Chest tightness. A peak flow reading that is __________ to __________ (50-79% of your personal best). If you have any of these symptoms: Add the following medicine to the ones that you use daily: Reliever or rescue medicine and dosage: _______________ Additional medicine and dosage: _______________ Call your health care provider if: You remain in the yellow zone for __________ hours. You are using a reliever or rescue medicine more than 2-3 times a week. Red zone Symptoms in this zone mean that you should get medical help right away. You will likely feel distressed and have symptoms at rest that restrict your activity. You are in the red zone if: You are breathing hard and quickly. Your nose opens wide, your ribs show, and your neck muscles become visible when you breathe in. Your lips, fingers, or toes are a bluish color. You have trouble speaking in full sentences. Your peak flow reading is less than __________ (less than 50% of your personal best). Your symptoms do not improve within 15-20 minutes after you use your reliever or rescue medicine (bronchodilator). If you have any of these symptoms: These symptoms represent a serious problem that is an emergency. Do not wait to see if the symptoms will go away. Get medical help right away. Call your local emergency services (911 in the [place] Do not drive yourself   to the hospital. Use your reliever or rescue medicine. Start a nebulizer treatment or take 2-4 puffs from a metered-dose inhaler with a spacer. Repeat this action every 15-20 minutes until help arrives. Where to find more information You can find more information about asthma  from: Centers for Disease Control and Prevention: www.cdc.gov American Lung Association: www.lung.org This information is not intended to replace advice given to you by your health care provider. Make sure you discuss any questions you have with your health care provider. Document Revised: 06/08/2020 Document Reviewed: 06/08/2020 Elsevier Patient Education  2023 Elsevier Inc.  

## 2022-08-21 NOTE — Progress Notes (Signed)
Subjective:  Patient ID: April [name], female    DOB: Jul 11, 1970  Age: 52 y.o. MRN: [id number]  CC: Hospitalization Follow-up   HPI April [name] is a 52 y.o. year old female with a history of morbid obesity, major depressive disorder, hypertension, COPD/ Asthma , paroxysmal atrial fibrillation obstructive sleep apnea (not compliant with CPAP) with multiple hospitalizations for asthma exacerbation/COPD , steroid-induced diabetes  (previously on metformin) .   Interval History: She presents today for hospital follow-up after hospitalization from 07/19/2022 through 07/19/2022 after being managed for asthma exacerbation.  She does not take Prednisone daily as prescribed by Pulmonary but waits until she feels bad and takes it about 3x /week as she is concerned about the weight gain side effects. She is on Trelegy, SABA, Singulair, Flonase, her nebulizer, Tezspire but still her asthma is not optimally controlled. She does not have a follow-up appointment with her pulmonologist until about 6 months.  With regards to her hypothyroidism last thyroid labs were normal in the summer of last year and she is due for thyroid labs today. Endorses adherence with anticoagulation for A-fib. She was discharged from a psychiatry practice due to 2 no-shows and he was requesting a referral to a different practice with a psychiatrist and therapist in the practice. Past Medical History:  Diagnosis Date   Acanthosis nigricans    Anxiety    Arthritis    "knees" (04/28/2017)   Asthma    Followed by Dr. Maple Hudson (pulmonology); receives every other week omalizumab injections; has frequent exacerbations   Back pain    Chronic diastolic CHF (congestive heart failure) 01/17/2017   COPD (chronic obstructive pulmonary disease)    PFTs in 2002, FEV1/FVC 65, no post bronchodilater test done   Depression    GERD (gastroesophageal reflux disease)    Headache(784.0)    "q couple days" (04/28/2017)   Helicobacter pylori (H.  pylori) infection    Hypertension, essential    Insomnia    Joint pain    Lower extremity edema    Menorrhagia    Morbid obesity    OSA on CPAP    Sleep study 2008 - mild OSA, not enough events to titrate CPAP; wears CPAP now/pt on 04/28/2017   Pneumonia X 1   Prediabetes    Rheumatoid arthritis    Seasonal allergies    Shortness of breath    Tobacco user    Vitamin D deficiency     Past Surgical History:  Procedure Laterality Date   CARDIOVERSION N/A 05/30/2017   Procedure: CARDIOVERSION;  Surgeon: Thurmon Fair, MD;  Location: [facility];  Service: Cardiovascular;  Laterality: N/A;   REDUCTION MAMMAPLASTY Bilateral 09/2011   TUBAL LIGATION  1996   bilateral    Family History  Problem Relation Age of Onset   Hypertension Mother    Asthma Daughter    Cancer Paternal Aunt    Asthma Maternal Grandmother     Social History   Socioeconomic History   Marital status: Single    Spouse name: Not on file   Number of children: 1   Years of education: Not on file   Highest education level: Not on file  Occupational History   Occupation: stay at home  Tobacco Use   Smoking status: Former    Packs/day: 0.50    Years: 26.00    Additional pack years: 0.00    Total pack years: 13.00    Types: Cigarettes    Quit date: 09/12/2014    Years  since quitting: 7.9   Smokeless tobacco: Never   Tobacco comments:    Has vaped before, caught in the hospital  Vaping Use   Vaping Use: Never used  Substance and Sexual Activity   Alcohol use: No   Drug use: No   Sexual activity: Never  Other Topics Concern   Not on file  Social History Narrative   Daughters are grown, not married, works as a Lawyer.   Social Determinants of Health   Financial Resource Strain: High Risk (01/30/2022)   Overall Financial Resource Strain (CARDIA)    Difficulty of Paying Living Expenses: Very hard  Food Insecurity: No Food Insecurity (07/19/2022)   Hunger Vital Sign    Worried About Running Out of  Food in the Last Year: Never true    Ran Out of Food in the Last Year: Never true  Transportation Needs: No Transportation Needs (07/19/2022)   PRAPARE - Administrator, Civil Service (Medical): No    Lack of Transportation (Non-Medical): No  Physical Activity: Insufficiently Active (01/30/2022)   Exercise Vital Sign    Days of Exercise per Week: 2 days    Minutes of Exercise per Session: 30 min  Stress: Stress Concern Present (01/30/2022)   [employer] of Occupational Health - Occupational Stress Questionnaire    Feeling of Stress : Very much  Social Connections: Socially Isolated (01/30/2022)   Social Connection and Isolation Panel [NHANES]    Frequency of Communication with Friends and Family: More than three times a week    Frequency of Social Gatherings with Friends and Family: Three times a week    Attends Religious Services: Never    Active Member of Clubs or Organizations: No    Attends Banker Meetings: Never    Marital Status: Never married    Allergies  Allergen Reactions   Other Shortness Of Breath and Other (See Comments)    Seasonal allergies- Wheezing   Contrast Media [Iodinated Contrast Media] Itching and Other (See Comments)    CT contrast    Outpatient Medications Prior to Visit  Medication Sig Dispense Refill   acetaminophen (TYLENOL) 500 MG tablet Take 500-1,000 mg by mouth every 6 (six) hours as needed for mild pain or headache.     albuterol (PROAIR HFA) 108 (90 Base) MCG/ACT inhaler Inhale 2 puffs into the lungs every 6 (six) hours as needed for wheezing or shortness of breath. (Patient taking differently: Inhale 2 puffs into the lungs See admin instructions. Inhale 2 puffs into the lungs every 2-4 hours as needed for shortness of breath or wheezing) 8.5 g 11   ALEVE 220 MG tablet Take 220-440 mg by mouth 2 (two) times daily as needed (for pain or headaches).     baclofen (LIORESAL) 20 MG tablet Take 20-40 mg by mouth 3 (three)  times daily as needed for muscle spasms.     clonazePAM (KLONOPIN) 0.5 MG tablet 1 twice daily as needed for anxiety (Patient taking differently: Take 0.5 mg by mouth in the morning and at bedtime.) 60 tablet 5   EPINEPHrine 0.3 mg/0.3 mL IJ SOAJ injection Inject 0.3 mg into the muscle as needed for anaphylaxis. 1 each 5   famotidine (PEPCID) 40 MG tablet Take 40 mg by mouth daily in the afternoon.     FLUoxetine (PROZAC) 20 MG capsule Take 1 capsule (20 mg total) by mouth daily. Take 1 capsule daily. (Patient taking differently: Take 20 mg by mouth in the morning.) 30 capsule 1  fluticasone (FLONASE) 50 MCG/ACT nasal spray SHAKE LIQUID AND USE 1 SPRAY IN EACH NOSTRIL DAILY AS NEEDED FOR ALLERGIES OR RHINITIS (Patient taking differently: Place 1 spray into both nostrils daily.) 16 g 0   Fluticasone-Umeclidin-Vilant (TRELEGY ELLIPTA) 200-62.5-25 MCG/ACT AEPB Inhale 1 puff into the lungs daily. 60 each 5   furosemide (LASIX) 20 MG tablet Take 1 tablet (20 mg total) by mouth daily. 90 tablet 0   gabapentin (NEURONTIN) 300 MG capsule Take 1 capsule (300 mg total) by mouth 3 (three) times daily. 90 capsule 3   ipratropium-albuterol (DUONEB) 0.5-2.5 (3) MG/3ML SOLN 1 neb every 6 hours if needed for asthma (Patient taking differently: Take 3 mLs by nebulization every 4 (four) hours as needed (shortness of breath).) 75 mL 12   levothyroxine (SYNTHROID) 50 MCG tablet Take 1 tablet (50 mcg total) by mouth daily. (Patient taking differently: Take 50 mcg by mouth daily before breakfast.) 90 tablet 1   montelukast (SINGULAIR) 10 MG tablet Take 1 tablet (10 mg total) by mouth at bedtime. 30 tablet 2   naloxone (NARCAN) nasal spray 4 mg/0.1 mL Place 1 spray into the nose once as needed (opioid overdose).     omeprazole (PRILOSEC) 40 MG capsule Take 40 mg by mouth See admin instructions. Take 40 mg by mouth one to two times a day before meals     oxyCODONE-acetaminophen (PERCOCET) 10-325 MG tablet Take 1 tablet by  mouth 5 (five) times daily as needed for pain. (Patient taking differently: Take 1 tablet by mouth 4 (four) times daily.) 150 tablet 0   phentermine 15 MG capsule Take 15 mg by mouth daily.     polyethylene glycol (MIRALAX / GLYCOLAX) 17 g packet Take 17 g by mouth daily. 14 each 0   predniSONE (DELTASONE) 10 MG tablet Take 1 tablet (10 mg total) by mouth daily with breakfast. Take 4 pills daily for 2 days then 3 pills daily for 3 days then resume 20 mg as you were taking before 12 tablet 0   predniSONE (DELTASONE) 20 MG tablet Take 1 tablet (20 mg total) by mouth daily with breakfast. Resume after finishing the tapering dose or prednisone     Semaglutide, 1 MG/DOSE, (OZEMPIC, 1 MG/DOSE,) 4 MG/3ML SOPN Inject 1 mg into the skin every Monday.     solifenacin (VESICARE) 5 MG tablet Take 1 tablet (5 mg total) by mouth daily. 90 tablet 1   Tezepelumab-ekko (TEZSPIRE) 210 MG/1. SOAJ Inject 210 mg into the skin every 28 (twenty-eight) days. 1.91 mL 5   topiramate (TOPAMAX) 200 MG tablet Take 200 mg by mouth at bedtime.     Vitamin D, Ergocalciferol, (DRISDOL) 1.25 MG (50000 UNIT) CAPS capsule Take 50,000 Units by mouth every Friday.     zolpidem (AMBIEN) 10 MG tablet TAKE 1 TABLET BY MOUTH AT BEDTIME AS NEEDED FOR SLEEP 30 tablet 5   amLODipine (NORVASC) 10 MG tablet Take 1 tablet (10 mg total) by mouth daily. 90 tablet 1   apixaban (ELIQUIS) 5 MG TABS tablet Take 1 tablet (5 mg total) by mouth 2 (two) times daily. 180 tablet 1   carvedilol (COREG) 6.25 MG tablet TAKE 1 TABLET(6.25 MG) BY MOUTH TWICE DAILY WITH A MEAL (Patient taking differently: Take 6.25 mg by mouth daily.) 180 tablet 1   cloNIDine (CATAPRES) 0.2 MG tablet TAKE 1 TABLET(0.2 MG) BY MOUTH TWICE DAILY (Patient taking differently: Take 0.2 mg by mouth 2 (two) times daily.) 180 tablet 1   potassium chloride SA (KLOR-CON  M) 20 MEQ tablet TAKE 1 TABLET(20 MEQ) BY MOUTH DAILY (Patient taking differently: Take 20 mEq by mouth daily.) 90 tablet  3   Facility-Administered Medications Prior to Visit  Medication Dose Route Frequency Provider Last Rate Last Admin   tezepelumab-ekko (TEZSPIRE) 210 MG/1. syringe 210 mg  210 mg Subcutaneous Once Jetty Duhamel D, MD         ROS Review of Systems  Constitutional:  Negative for activity change and appetite change.  HENT:  Negative for sinus pressure and sore throat.   Respiratory:  Negative for chest tightness, shortness of breath and wheezing.   Cardiovascular:  Negative for chest pain and palpitations.  Gastrointestinal:  Negative for abdominal distention, abdominal pain and constipation.  Genitourinary: Negative.   Musculoskeletal: Negative.   Psychiatric/Behavioral:  Negative for behavioral problems and dysphoric mood.     Objective:  BP 125/73   Pulse 69   Ht 5\' 6"  (1.676 m)   Wt (!) 321 lb 9.6 oz (145.9 kg)   SpO2 100%   BMI 51.91 kg/m      08/21/2022   10:02 AM 07/29/2022    1:03 PM 07/22/2022    4:58 AM  BP/Weight  Systolic BP 125 110 122  Diastolic BP 73 73 62  Wt. (Lbs) 321.6 307   BMI 51.91 kg/m2 49.55 kg/m2       Physical Exam Constitutional:      Appearance: She is well-developed.  Cardiovascular:     Rate and Rhythm: Normal rate.     Heart sounds: Normal heart sounds. No murmur heard. Pulmonary:     Effort: Pulmonary effort is normal.     Breath sounds: Wheezing present. No rales.  Chest:     Chest wall: No tenderness.  Abdominal:     General: Bowel sounds are normal. There is no distension.     Palpations: Abdomen is soft. There is no mass.     Tenderness: There is no abdominal tenderness.  Musculoskeletal:        General: Normal range of motion.     Right lower leg: No edema.     Left lower leg: No edema.  Neurological:     Mental Status: She is alert and oriented to person, place, and time.  Psychiatric:        Mood and Affect: Mood normal.        Latest Ref Rng & Units 07/20/2022    5:45 AM 07/19/2022    5:50 PM 07/01/2022    8:49  PM  CMP  Glucose 70 - 99 mg/dL 161  77  96   BUN 6 - 20 mg/dL 13  12  12    Creatinine 0.44 - 1.00 mg/dL 0.96  0.45  4.09   Sodium 135 - 145 mmol/L 134  132  136   Potassium 3.5 - 5.1 mmol/L 4.0  3.1  3.5   Chloride 98 - 111 mmol/L 103  99  106   CO2 22 - 32 mmol/L 21  21  21    Calcium 8.9 - 10.3 mg/dL 8.3  8.4  8.7     Lipid Panel     Component Value Date/Time   CHOL 192 10/16/2021 0506   CHOL 238 (H) 03/30/2020 1128   TRIG 51 10/16/2021 0506   HDL 99 10/16/2021 0506   HDL 45 03/30/2020 1128   CHOLHDL 1.9 10/16/2021 0506   VLDL 10 10/16/2021 0506   LDLCALC 83 10/16/2021 0506   LDLCALC 167 (H) 03/30/2020 1128  CBC    Component Value Date/Time   WBC 16.9 (H) 07/22/2022 0455   RBC 4.11 07/22/2022 0455   HGB 10.6 (L) 07/22/2022 0455   HGB 10.4 (L) 01/22/2018 1403   HCT 34.3 (L) 07/22/2022 0455   HCT 33.3 (L) 01/22/2018 1403   PLT 425 (H) 07/22/2022 0455   PLT 616 (H) 01/22/2018 1403   MCV 83.5 07/22/2022 0455   MCV 82 01/22/2018 1403   MCH 25.8 (L) 07/22/2022 0455   MCHC 30.9 07/22/2022 0455   RDW 17.5 (H) 07/22/2022 0455   RDW 17.1 (H) 01/22/2018 1403   LYMPHSABS 1.4 07/20/2022 0545   LYMPHSABS 3.3 (H) 01/22/2018 1403   MONOABS 0.3 07/20/2022 0545   EOSABS 0.0 07/20/2022 0545   EOSABS 0.3 01/22/2018 1403   BASOSABS 0.0 07/20/2022 0545   BASOSABS 0.1 01/22/2018 1403    Lab Results  Component Value Date   HGBA1C 5.9 (H) 07/19/2022   Lab Results  Component Value Date   TSH 2.686 10/23/2021     Assessment & Plan:  1. Hypertension associated with diabetes Controlled Counseled on blood pressure goal of less than 130/80, low-sodium, DASH diet, medication compliance, 150 minutes of moderate intensity exercise per week. Discussed medication compliance, adverse effects. - amLODipine (NORVASC) 10 MG tablet; Take 1 tablet (10 mg total) by mouth daily.  Dispense: 90 tablet; Refill: 1 - carvedilol (COREG) 6.25 MG tablet; Take 1 tablet (6.25 mg total) by mouth 2  (two) times daily with a meal. TAKE 1 TABLET(6.25 MG) BY MOUTH TWICE DAILY WITH A MEAL Strength: 6.25 mg  Dispense: 180 tablet; Refill: 1 - cloNIDine (CATAPRES) 0.2 MG tablet; Take 1 tablet (0.2 mg total) by mouth 2 (two) times daily. TAKE 1 TABLET(0.2 MG) BY MOUTH TWICE DAILY Strength: 0.2 mg  Dispense: 180 tablet; Refill: 1  2. AF (paroxysmal atrial fibrillation) Currently in sinus rhythm Continue rate control with metoprolol, anticoagulation with Eliquis - apixaban (ELIQUIS) 5 MG TABS tablet; Take 1 tablet (5 mg total) by mouth 2 (two) times daily.  Dispense: 180 tablet; Refill: 1  3. Other specified hypothyroidism Controlled Will check thyroid labs and adjust regimen accordingly - T4, free - TSH - T3  4. Moderate episode of recurrent major depressive disorder Stable She requests a new psychiatrist and I have called in the LCSW for counseling and to connect her to resources  5. Moderate persistent asthma with exacerbation Uncontrolled with frequent flares and she does have a chronic wheeze Takes all her medications except for prednisone daily due to concerns of weight gain The pollen is also triggering her symptoms Advised to keep appointment with pulmonology    Meds ordered this encounter  Medications   amLODipine (NORVASC) 10 MG tablet    Sig: Take 1 tablet (10 mg total) by mouth daily.    Dispense:  90 tablet    Refill:  1   apixaban (ELIQUIS) 5 MG TABS tablet    Sig: Take 1 tablet (5 mg total) by mouth 2 (two) times daily.    Dispense:  180 tablet    Refill:  1   carvedilol (COREG) 6.25 MG tablet    Sig: Take 1 tablet (6.25 mg total) by mouth 2 (two) times daily with a meal. TAKE 1 TABLET(6.25 MG) BY MOUTH TWICE DAILY WITH A MEAL Strength: 6.25 mg    Dispense:  180 tablet    Refill:  1   cloNIDine (CATAPRES) 0.2 MG tablet    Sig: Take 1 tablet (0.2 mg total) by  mouth 2 (two) times daily. TAKE 1 TABLET(0.2 MG) BY MOUTH TWICE DAILY Strength: 0.2 mg    Dispense:   180 tablet    Refill:  1    **Patient requests 90 days supply**   potassium chloride SA (KLOR-CON M) 20 MEQ tablet    Sig: Take 1 tablet (20 mEq total) by mouth daily.    Dispense:  90 tablet    Refill:  1    Follow-up: Return in about 3 months (around 11/20/2022) for Chronic medical conditions; cancel existing appointment.       Hoy Register, MD, FAAFP. [facility] [place], [place] [phone]   08/21/2022, 5:16 PM

## 2022-08-22 ENCOUNTER — Other Ambulatory Visit (HOSPITAL_COMMUNITY): Payer: Self-pay

## 2022-08-22 LAB — T4, FREE: Free T4: 1.05 ng/dL (ref 0.82–1.77)

## 2022-08-22 LAB — T3: T3, Total: 154 ng/dL (ref 71–180)

## 2022-08-22 LAB — TSH: TSH: 2.87 u[IU]/mL (ref 0.450–4.500)

## 2022-08-23 ENCOUNTER — Other Ambulatory Visit (HOSPITAL_COMMUNITY): Payer: Self-pay

## 2022-08-26 ENCOUNTER — Ambulatory Visit (INDEPENDENT_AMBULATORY_CARE_PROVIDER_SITE_OTHER): Payer: 59 | Admitting: *Deleted

## 2022-08-26 VITALS — BP 150/83 | HR 71 | Temp 97.7°F | Resp 16 | Ht 66.0 in | Wt 314.8 lb

## 2022-08-26 DIAGNOSIS — J4552 Severe persistent asthma with status asthmaticus: Secondary | ICD-10-CM

## 2022-08-26 DIAGNOSIS — J4489 Other specified chronic obstructive pulmonary disease: Secondary | ICD-10-CM

## 2022-08-26 MED ORDER — TEZEPELUMAB-EKKO 210 MG/1.91ML ~~LOC~~ SOSY
210.0000 mg | PREFILLED_SYRINGE | Freq: Once | SUBCUTANEOUS | Status: AC
  Administered 2022-08-26: 210 mg via SUBCUTANEOUS

## 2022-08-26 NOTE — Progress Notes (Signed)
Diagnosis: Asthma  Provider:  Chilton Greathouse MD  Procedure: Injection  Tezspire (Tezepelumab), Dose: 210 mg, Site: subcutaneous, Number of injections: 1  Post Care: Observation period completed  Discharge: Condition: Good, Destination: Home . AVS Declined  Performed by:  Forrest Moron, RN

## 2022-08-27 ENCOUNTER — Ambulatory Visit: Payer: Medicare Other | Admitting: Family Medicine

## 2022-09-02 NOTE — Telephone Encounter (Signed)
All orders has been submitted for patient

## 2022-09-02 NOTE — Telephone Encounter (Signed)
Kristen with [employer] called to get an up update on the order for absorbant pads and pulse oximeter. Please advise. [phone].

## 2022-09-08 IMAGING — CR DG CHEST 2V
2 series · 2 of 2 positions shown · non-contrast
Comparison: 05/22/2021

CLINICAL DATA: Shortness of breath

EXAM:
CHEST - 2 VIEW

[w chest pa]
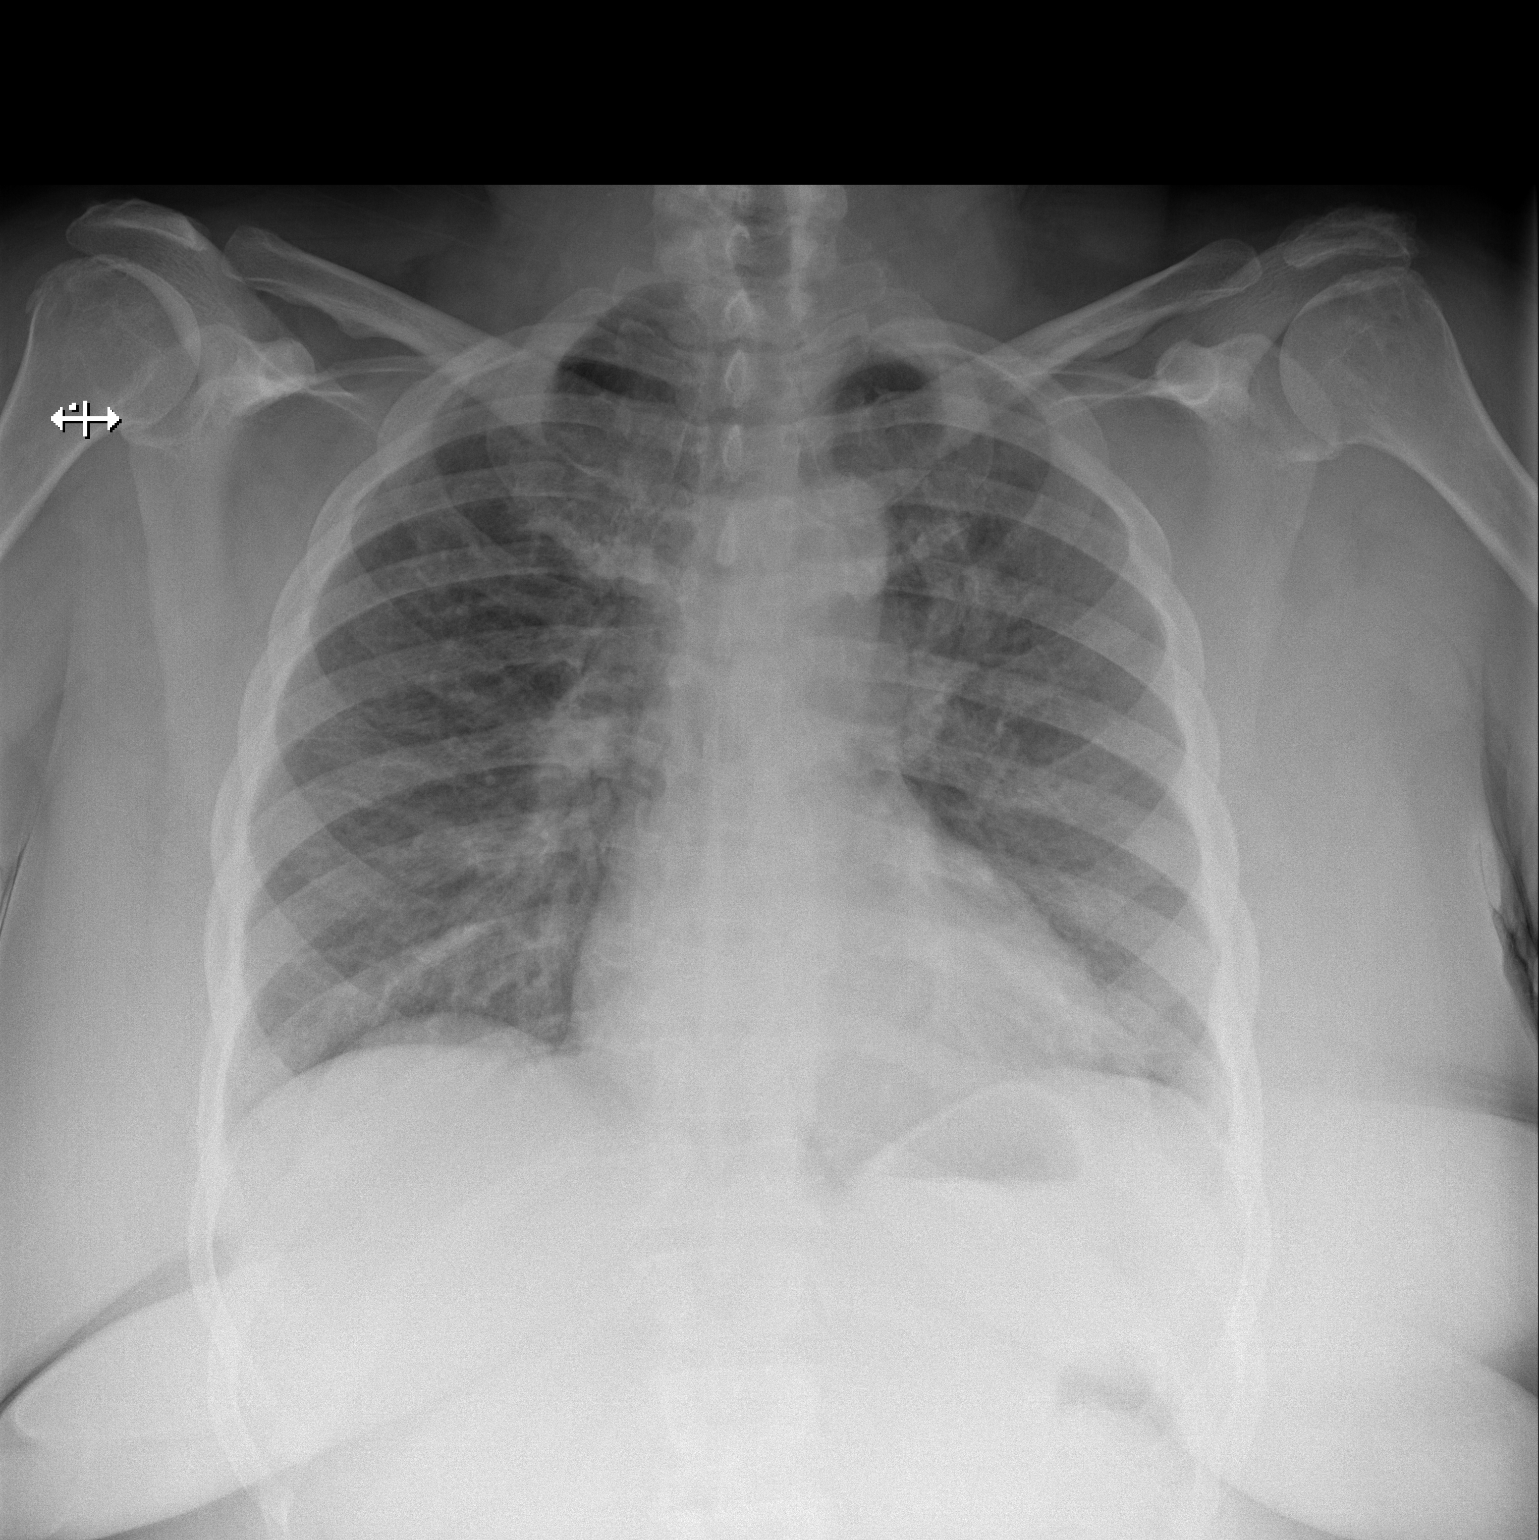

[w chest lat]
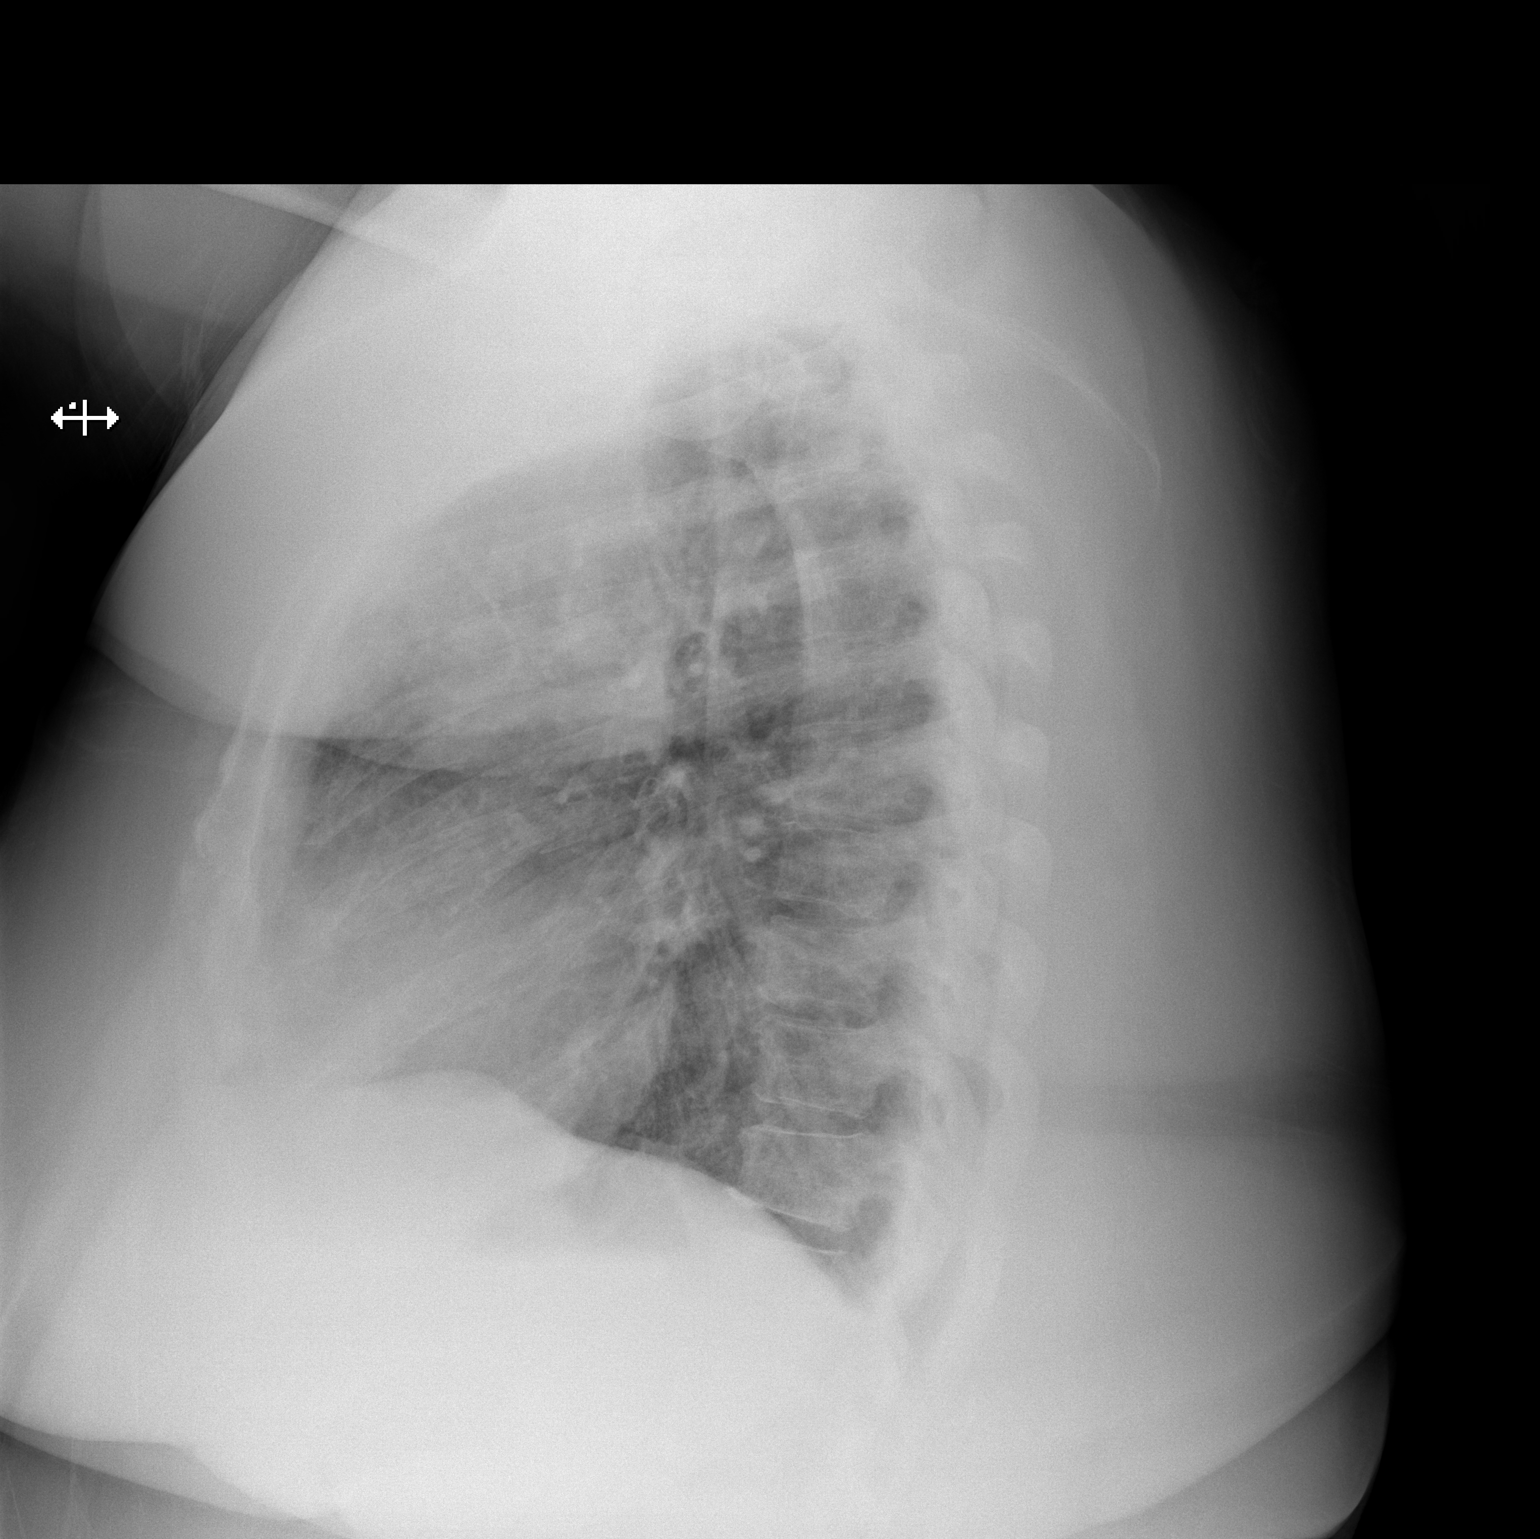

[2 of 2 positions shown; findings below may reference images not displayed]

FINDINGS: Cardiac size is within normal limits. Central pulmonary vessels are
prominent without signs of alveolar pulmonary edema. There is no
focal pulmonary consolidation. There is no pleural effusion or
pneumothorax.
IMPRESSION: Central pulmonary vessels are prominent without signs of pulmonary
edema. There is no focal pulmonary consolidation. There is no
pleural effusion or pneumothorax.

## 2022-09-11 ENCOUNTER — Ambulatory Visit (INDEPENDENT_AMBULATORY_CARE_PROVIDER_SITE_OTHER): Payer: 59 | Admitting: Licensed Clinical Social Worker

## 2022-09-11 DIAGNOSIS — F4310 Post-traumatic stress disorder, unspecified: Secondary | ICD-10-CM

## 2022-09-11 DIAGNOSIS — F331 Major depressive disorder, recurrent, moderate: Secondary | ICD-10-CM

## 2022-09-11 IMAGING — CR DG CHEST 2V
2 series · 2 of 2 positions shown · non-contrast
Comparison: 08/23/2021 and prior radiographs

CLINICAL DATA: Acute shortness of breath

EXAM:
CHEST - 2 VIEW

[w chest lat]
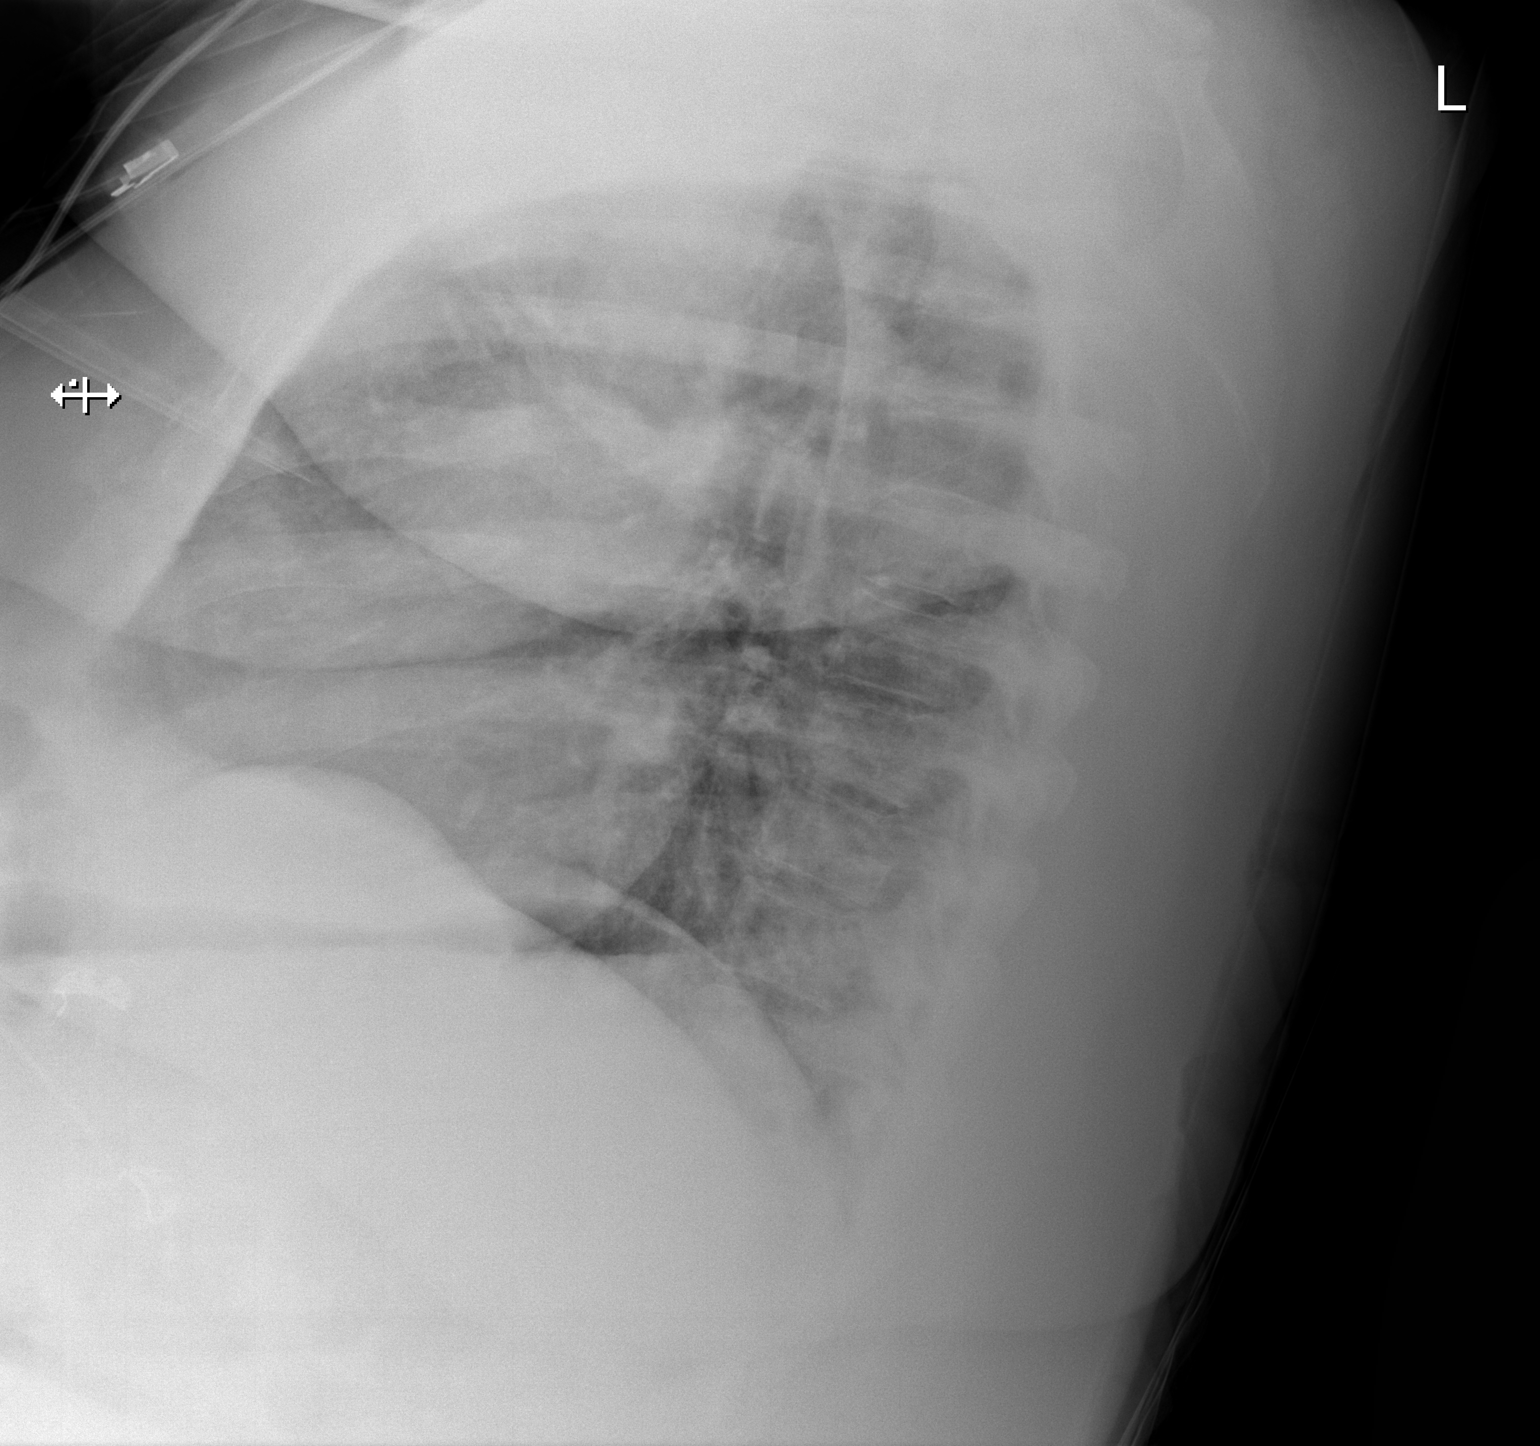

[x chest ap]
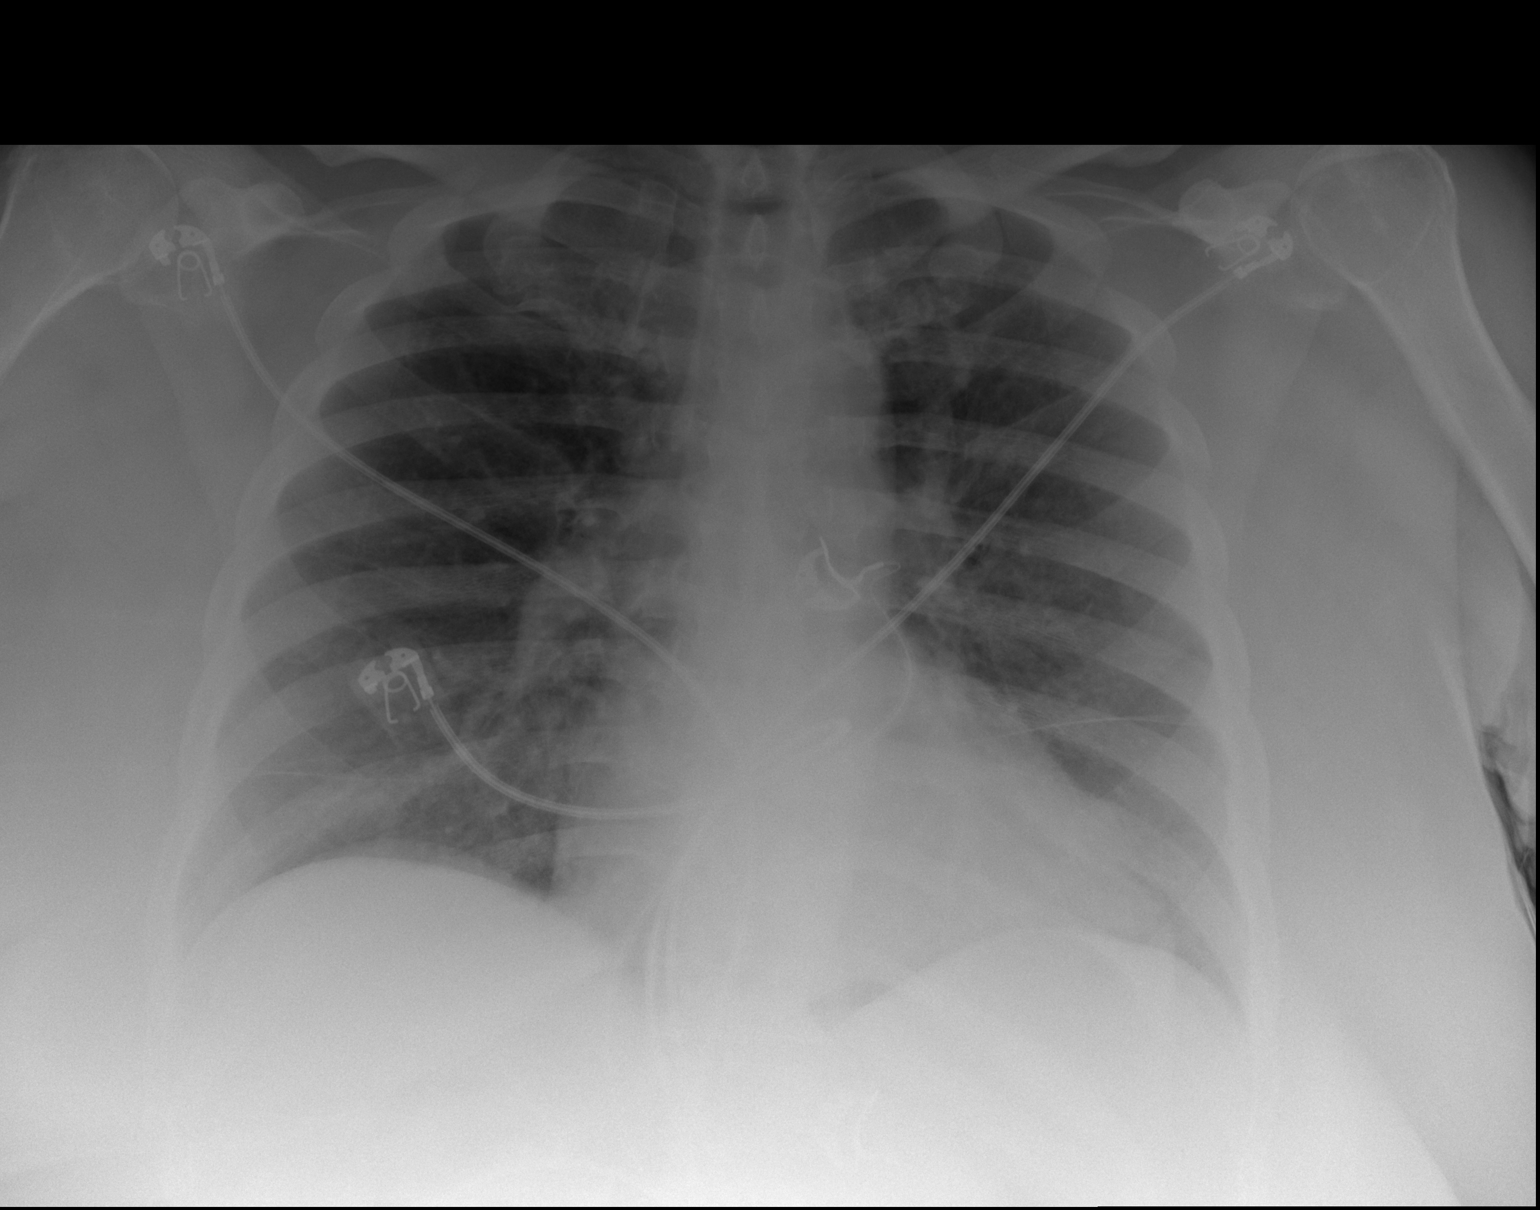

[2 of 2 positions shown; findings below may reference images not displayed]

FINDINGS: The cardiomediastinal silhouette is unremarkable.

Mild peribronchial thickening is unchanged.

Mild pulmonary vascular congestion noted.

There is no evidence of focal airspace disease, pulmonary edema,
suspicious pulmonary nodule/mass, pleural effusion, or pneumothorax.

No acute bony abnormalities are identified.
IMPRESSION: Mild pulmonary vascular congestion.

## 2022-09-11 NOTE — Progress Notes (Signed)
Pt requested audio visit at time of session  Virtual Visit via Video Note  I connected with April [name] on 09/11/22 at  2:00 PM EDT by a video enabled telemedicine application and verified that I am speaking with the correct person using two identifiers.  Location: Patient: home Provider: [facility]   I discussed the limitations of evaluation and management by telemedicine and the availability of in person appointments. The patient expressed understanding and agreed to proceed.  I discussed the assessment and treatment plan with the patient. The patient was provided an opportunity to ask questions and all were answered. The patient agreed with the plan and demonstrated an understanding of the instructions.   The patient was advised to call back or seek an in-person evaluation if the symptoms worsen or if the condition fails to improve as anticipated.  I provided 30 minutes of non-face-to-face time during this encounter.   Sosaia Pittinger R Thresea Doble, LCSW   THERAPIST PROGRESS NOTE  Session Time: 3-330p  Participation Level: Active  Behavioral Response: CasualAlertAnxious and Depressed  Type of Therapy: Individual Therapy  Treatment Goals addressed: Develop healthy thinking patterns and beliefs about self, others, and the world that lead to the alleviation and help prevent the relapse of depression per self report 3 out of 5 sessions documented   Learn and implement coping skills that result in a reduction of anxiety and worry, and improve daily functioning per pt report 3 out of 5 sessions documented   ProgressTowards Goals: Progressing  Interventions: Solution Focused and Supportive  Summary: April [name] is a 52 y.o. female who presents with continuing symptoms related to depression and trauma/anxiety.  Good sleep quality and quantity.   Clinician assisted pt with identifying current levels of chronic pain, and explored medical interventions  for treating chronic pain. Discussed overall impact of pain on daily activities, relationships, and ability/inability to engage in self care or recreational activities. Reviewed keeping balance between rest and activity, and to continue communicating with medical providers for pain management. Reviewed mindfulness and meditation as relaxation interventions.   Explored current levels of mood and overall energy. Discussed depression symptoms and medication compliance. Used motivational interviewing techniques to encourage pt to increase activity through the day, focus on overall prosocial behaviors, and engage in cognitively stimulating activities. Discussed cognitive distortions, automatic negative thoughts, and discussed using positive self talk and reframing.   Discussed upcoming move--pt approved for an apartment but the apartment needs to pass inspection. Pt reports that she's 2 weeks behind on move in date and upset by this. Pt states some issues with Medicaid have pulled all of her enhanced services. Pt states she was referred to [facility] for their enhanced services.   Continued recommendations are as follows: self care behaviors, positive social engagements, focusing on overall work/home/life balance, and focusing on positive physical and emotional wellness.   Suicidal/Homicidal: No  Therapist Response: Pt is continuing to apply interventions learned in session into daily life situations. Pt is currently on track to meet goals utilizing interventions mentioned above. Personal growth and progress noted. Treatment to continue as indicated.   Plan: Return again in 4 weeks.  Diagnosis:  Encounter Diagnoses  Name Primary?   Moderate episode of recurrent major depressive disorder (HCC) Yes   PTSD (post-traumatic stress disorder)     Collaboration of Care: Other pt encouraged to continue care with psychiatric provider of record, Dr. Karsten Ro.   Patient/Guardian was advised Release of  Information must be obtained prior  to any record release in order to collaborate their care with an outside provider. Patient/Guardian was advised if they have not already done so to contact the registration department to sign all necessary forms in order for [place] to release information regarding their care.   Consent: Patient/Guardian gives verbal consent for treatment and assignment of benefits for services provided during this visit. Patient/Guardian expressed understanding and agreed to proceed.   Ernest Haber Kimberlin Scheel, LCSW 09/11/2022

## 2022-09-17 ENCOUNTER — Encounter: Payer: Self-pay | Admitting: Neurology

## 2022-09-17 ENCOUNTER — Ambulatory Visit (INDEPENDENT_AMBULATORY_CARE_PROVIDER_SITE_OTHER): Payer: 59 | Admitting: Neurology

## 2022-09-17 VITALS — BP 147/89 | HR 81 | Ht 66.0 in | Wt 307.0 lb

## 2022-09-17 DIAGNOSIS — S060X0D Concussion without loss of consciousness, subsequent encounter: Secondary | ICD-10-CM | POA: Diagnosis not present

## 2022-09-17 DIAGNOSIS — G44209 Tension-type headache, unspecified, not intractable: Secondary | ICD-10-CM

## 2022-09-17 NOTE — Progress Notes (Signed)
[facility]  PATIENT: April [name] DOB: 1970/10/11  REQUESTING CLINICIAN: Hoy Register, MD HISTORY FROM: Patient  REASON FOR VISIT: New onset headaches    HISTORICAL  CHIEF COMPLAINT:  Chief Complaint  Patient presents with   New Patient (Initial Visit)    Rm 12. Alone. NP Internal referral for headache. C/o daily headaches. She has visual aura before headaches occur.    HISTORY OF PRESENT ILLNESS:  This is a 52 year old woman past medical history including obesity, atrial fibrillation, hypertension, hyperlipidemia, neuropathy, COPD and asthma who is presenting with new onset headache.  Patient reported headaches started 2 months ago after the sheet rock fell off the ceiling and on top of her head while using the bathroom.  She denies any loss of consciousness but presented to the ED.  In the ED she had a head CT which was negative for any acute abnormality, no bleeding.  Since then she has been reporting daily headaches.  Headaches can last a few hours up to the whole day.  Described them as throbbing pain with photosensitivity, denies any phonophobia no nausea no vomiting.  With the headache she has tried acetaminophen and ibuprofen with minimal relief.  She report prior to head injury she never had headaches before.   Headache History and Characteristics: Onset: 2 months ago  Location: Frontal  Quality:  throbbing  Intensity: 7-8/10.  Duration: 5 times a week, can last all day  Migrainous Features: Photophobia Aura: No  History of brain injury or tumor: No  Motion sickness: no Cardiac history: no  OTC: tylenol, Aleve, Motrin    Prior prophylaxis: Propranolol: No  Verapamil:No TCA: No Topamax: No Depakote: No Effexor: No Cymbalta: No Neurontin:No  Prior abortives: Triptan: No Anti-emetic: No Steroids: No Ergotamine suppository: No    OTHER MEDICAL CONDITIONS: Asthma, hypertension, COPD, atrial fibrillation,    REVIEW OF SYSTEMS:  Full 14 system review of systems performed and negative with exception of: As noted in the HPI   ALLERGIES: Allergies  Allergen Reactions   Other Shortness Of Breath and Other (See Comments)    Seasonal allergies- Wheezing   Contrast Media [Iodinated Contrast Media] Itching and Other (See Comments)    CT contrast    HOME MEDICATIONS: Outpatient Medications Prior to Visit  Medication Sig Dispense Refill   acetaminophen (TYLENOL) 500 MG tablet Take 500-1,000 mg by mouth every 6 (six) hours as needed for mild pain or headache.     albuterol (PROAIR HFA) 108 (90 Base) MCG/ACT inhaler Inhale 2 puffs into the lungs every 6 (six) hours as needed for wheezing or shortness of breath. (Patient taking differently: Inhale 2 puffs into the lungs See admin instructions. Inhale 2 puffs into the lungs every 2-4 hours as needed for shortness of breath or wheezing) 8.5 g 11   ALEVE 220 MG tablet Take 220-440 mg by mouth 2 (two) times daily as needed (for pain or headaches).     amLODipine (NORVASC) 10 MG tablet Take 1 tablet (10 mg total) by mouth daily. 90 tablet 1   apixaban (ELIQUIS) 5 MG TABS tablet Take 1 tablet (5 mg total) by mouth 2 (two) times daily. 180 tablet 1   baclofen (LIORESAL) 20 MG tablet Take 20-40 mg by mouth 3 (three) times daily as needed for muscle spasms.     carvedilol (COREG) 6.25 MG tablet Take 1 tablet (6.25 mg total) by mouth 2 (two) times daily with a meal. TAKE 1 TABLET(6.25 MG) BY MOUTH TWICE DAILY WITH  A MEAL Strength: 6.25 mg 180 tablet 1   clonazePAM (KLONOPIN) 0.5 MG tablet 1 twice daily as needed for anxiety (Patient taking differently: Take 0.5 mg by mouth in the morning and at bedtime.) 60 tablet 5   cloNIDine (CATAPRES) 0.2 MG tablet Take 1 tablet (0.2 mg total) by mouth 2 (two) times daily. TAKE 1 TABLET(0.2 MG) BY MOUTH TWICE DAILY Strength: 0.2 mg 180 tablet 1   famotidine (PEPCID) 40 MG tablet Take 40 mg by mouth daily in the afternoon.     FLUoxetine (PROZAC) 20  MG capsule Take 1 capsule (20 mg total) by mouth daily. Take 1 capsule daily. (Patient taking differently: Take 20 mg by mouth in the morning.) 30 capsule 1   fluticasone (FLONASE) 50 MCG/ACT nasal spray SHAKE LIQUID AND USE 1 SPRAY IN EACH NOSTRIL DAILY AS NEEDED FOR ALLERGIES OR RHINITIS (Patient taking differently: Place 1 spray into both nostrils daily.) 16 g 0   Fluticasone-Umeclidin-Vilant (TRELEGY ELLIPTA) 200-62.5-25 MCG/ACT AEPB Inhale 1 puff into the lungs daily. 60 each 5   furosemide (LASIX) 20 MG tablet Take 1 tablet (20 mg total) by mouth daily. 90 tablet 0   gabapentin (NEURONTIN) 300 MG capsule Take 1 capsule (300 mg total) by mouth 3 (three) times daily. 90 capsule 3   ipratropium-albuterol (DUONEB) 0.5-2.5 (3) MG/3ML SOLN 1 neb every 6 hours if needed for asthma (Patient taking differently: Take 3 mLs by nebulization every 4 (four) hours as needed (shortness of breath).) 75 mL 12   levothyroxine (SYNTHROID) 50 MCG tablet Take 1 tablet (50 mcg total) by mouth daily. (Patient taking differently: Take 50 mcg by mouth daily before breakfast.) 90 tablet 1   montelukast (SINGULAIR) 10 MG tablet Take 1 tablet (10 mg total) by mouth at bedtime. 30 tablet 2   oxyCODONE-acetaminophen (PERCOCET) 10-325 MG tablet Take 1 tablet by mouth 5 (five) times daily as needed for pain. (Patient taking differently: Take 1 tablet by mouth 4 (four) times daily.) 150 tablet 0   phentermine 15 MG capsule Take 15 mg by mouth daily.     polyethylene glycol (MIRALAX / GLYCOLAX) 17 g packet Take 17 g by mouth daily. 14 each 0   potassium chloride SA (KLOR-CON M) 20 MEQ tablet Take 1 tablet (20 mEq total) by mouth daily. 90 tablet 1   Semaglutide, 1 MG/DOSE, (OZEMPIC, 1 MG/DOSE,) 4 MG/3ML SOPN Inject 1 mg into the skin every Monday.     solifenacin (VESICARE) 5 MG tablet Take 1 tablet (5 mg total) by mouth daily. 90 tablet 1   Tezepelumab-ekko (TEZSPIRE) 210 MG/1. SOAJ Inject 210 mg into the skin every 28  (twenty-eight) days. 1.91 mL 5   topiramate (TOPAMAX) 200 MG tablet Take 200 mg by mouth at bedtime.     Vitamin D, Ergocalciferol, (DRISDOL) 1.25 MG (50000 UNIT) CAPS capsule Take 50,000 Units by mouth every Friday.     zolpidem (AMBIEN) 10 MG tablet TAKE 1 TABLET BY MOUTH AT BEDTIME AS NEEDED FOR SLEEP 30 tablet 5   EPINEPHrine 0.3 mg/0.3 mL IJ SOAJ injection Inject 0.3 mg into the muscle as needed for anaphylaxis. (Patient not taking: Reported on 09/17/2022) 1 each 5   naloxone (NARCAN) nasal spray 4 mg/0.1 mL Place 1 spray into the nose once as needed (opioid overdose). (Patient not taking: Reported on 09/17/2022)     omeprazole (PRILOSEC) 40 MG capsule Take 40 mg by mouth See admin instructions. Take 40 mg by mouth one to two times a day before  meals (Patient not taking: Reported on 09/17/2022)     predniSONE (DELTASONE) 10 MG tablet Take 1 tablet (10 mg total) by mouth daily with breakfast. Take 4 pills daily for 2 days then 3 pills daily for 3 days then resume 20 mg as you were taking before 12 tablet 0   predniSONE (DELTASONE) 20 MG tablet Take 1 tablet (20 mg total) by mouth daily with breakfast. Resume after finishing the tapering dose or prednisone     Facility-Administered Medications Prior to Visit  Medication Dose Route Frequency Provider Last Rate Last Admin   tezepelumab-ekko (TEZSPIRE) 210 MG/1. syringe 210 mg  210 mg Subcutaneous Once Jetty Duhamel D, MD        PAST MEDICAL HISTORY: Past Medical History:  Diagnosis Date   Acanthosis nigricans    Anxiety    Arthritis    "knees" (04/28/2017)   Asthma    Followed by Dr. Maple Hudson (pulmonology); receives every other week omalizumab injections; has frequent exacerbations   Back pain    Chronic diastolic CHF (congestive heart failure) (HCC) 01/17/2017   COPD (chronic obstructive pulmonary disease) (HCC)    PFTs in 2002, FEV1/FVC 65, no post bronchodilater test done   Depression    GERD (gastroesophageal reflux disease)     Headache(784.0)    "q couple days" (04/28/2017)   Helicobacter pylori (H. pylori) infection    Hypertension, essential    Insomnia    Joint pain    Lower extremity edema    Menorrhagia    Morbid obesity (HCC)    OSA on CPAP    Sleep study 2008 - mild OSA, not enough events to titrate CPAP; wears CPAP now/pt on 04/28/2017   Pneumonia X 1   Prediabetes    Rheumatoid arthritis (HCC)    Seasonal allergies    Shortness of breath    Tobacco user    Vitamin D deficiency     PAST SURGICAL HISTORY: Past Surgical History:  Procedure Laterality Date   CARDIOVERSION N/A 05/30/2017   Procedure: CARDIOVERSION;  Surgeon: Thurmon Fair, MD;  Location: [facility];  Service: Cardiovascular;  Laterality: N/A;   REDUCTION MAMMAPLASTY Bilateral 09/2011   TUBAL LIGATION  1996   bilateral    FAMILY HISTORY: Family History  Problem Relation Age of Onset   Hypertension Mother    Asthma Daughter    Cancer Paternal Aunt    Asthma Maternal Grandmother     SOCIAL HISTORY: Social History   Socioeconomic History   Marital status: Single    Spouse name: Not on file   Number of children: 1   Years of education: Not on file   Highest education level: Not on file  Occupational History   Occupation: stay at home  Tobacco Use   Smoking status: Former    Packs/day: 0.50    Years: 26.00    Additional pack years: 0.00    Total pack years: 13.00    Types: Cigarettes    Quit date: 09/12/2014    Years since quitting: 8.0   Smokeless tobacco: Never   Tobacco comments:    Has vaped before, caught in the hospital  Vaping Use   Vaping Use: Never used  Substance and Sexual Activity   Alcohol use: No   Drug use: No   Sexual activity: Never  Other Topics Concern   Not on file  Social History Narrative   Daughters are grown, not married, works as a Lawyer.   Social Determinants of Health   Financial  Resource Strain: High Risk (01/30/2022)   Overall Financial Resource Strain (CARDIA)     Difficulty of Paying Living Expenses: Very hard  Food Insecurity: No Food Insecurity (07/19/2022)   Hunger Vital Sign    Worried About Running Out of Food in the Last Year: Never true    Ran Out of Food in the Last Year: Never true  Transportation Needs: No Transportation Needs (07/19/2022)   PRAPARE - Administrator, Civil Service (Medical): No    Lack of Transportation (Non-Medical): No  Physical Activity: Insufficiently Active (01/30/2022)   Exercise Vital Sign    Days of Exercise per Week: 2 days    Minutes of Exercise per Session: 30 min  Stress: Stress Concern Present (01/30/2022)   [employer] of Occupational Health - Occupational Stress Questionnaire    Feeling of Stress : Very much  Social Connections: Socially Isolated (01/30/2022)   Social Connection and Isolation Panel [NHANES]    Frequency of Communication with Friends and Family: More than three times a week    Frequency of Social Gatherings with Friends and Family: Three times a week    Attends Religious Services: Never    Active Member of Clubs or Organizations: No    Attends Banker Meetings: Never    Marital Status: Never married  Intimate Partner Violence: Not At Risk (07/19/2022)   Humiliation, Afraid, Rape, and Kick questionnaire    Fear of Current or Ex-Partner: No    Emotionally Abused: No    Physically Abused: No    Sexually Abused: No    PHYSICAL EXAM  GENERAL EXAM/CONSTITUTIONAL: Vitals:  Vitals:   09/17/22 1415  BP: (!) 147/89  Pulse: 81  Weight: (!) 307 lb (139.3 kg)  Height: 5\' 6"  (1.676 m)   Body mass index is 49.55 kg/m. Wt Readings from Last 3 Encounters:  09/17/22 (!) 307 lb (139.3 kg)  08/26/22 (!) 314 lb 12.8 oz (142.8 kg)  08/21/22 (!) 321 lb 9.6 oz (145.9 kg)   Patient is in no distress; well developed, nourished and groomed; neck is supple  MUSCULOSKELETAL: Gait, strength, tone, movements noted in Neurologic exam below  NEUROLOGIC: MENTAL STATUS:       No data to display         awake, alert, oriented to person, place and time recent and remote memory intact normal attention and concentration language fluent, comprehension intact, naming intact fund of knowledge appropriate  CRANIAL NERVE:  2nd, 3rd, 4th, 6th - pupils equal and reactive to light, visual fields full to confrontation, extraocular muscles intact, no nystagmus 5th - facial sensation symmetric 7th - facial strength symmetric 8th - hearing intact 9th - palate elevates symmetrically, uvula midline 11th - shoulder shrug symmetric 12th - tongue protrusion midline  MOTOR:  normal bulk and tone, full strength in the BUE, BLE  SENSORY:  normal and symmetric to light touch  COORDINATION:  finger-nose-finger, fine finger movements normal  GAIT/STATION:  Antalgic    DIAGNOSTIC DATA (LABS, IMAGING, TESTING) - I reviewed patient records, labs, notes, testing and imaging myself where available.  Lab Results  Component Value Date   WBC 16.9 (H) 07/22/2022   HGB 10.6 (L) 07/22/2022   HCT 34.3 (L) 07/22/2022   MCV 83.5 07/22/2022   PLT 425 (H) 07/22/2022      Component Value Date/Time   NA 134 (L) 07/20/2022 0545   NA 139 03/11/2022 1513   K 4.0 07/20/2022 0545   CL 103 07/20/2022 0545  CO2 21 (L) 07/20/2022 0545   GLUCOSE 168 (H) 07/20/2022 0545   BUN 13 07/20/2022 0545   BUN 13 03/11/2022 1513   CREATININE 0.85 07/20/2022 0545   CREATININE 0.56 05/21/2016 1708   CALCIUM 8.3 (L) 07/20/2022 0545   PROT 6.8 05/01/2022 0015   ALBUMIN 3.1 (L) 05/01/2022 0015   AST 24 05/01/2022 0015   ALT 18 05/01/2022 0015   ALKPHOS 63 05/01/2022 0015   BILITOT 0.3 05/01/2022 0015   GFRNONAA >60 07/20/2022 0545   GFRNONAA >89 05/21/2016 1708   GFRAA >60 02/20/2019 0312   GFRAA >89 05/21/2016 1708   Lab Results  Component Value Date   CHOL 192 10/16/2021   HDL 99 10/16/2021   LDLCALC 83 10/16/2021   TRIG 51 10/16/2021   CHOLHDL 1.9 10/16/2021   Lab Results   Component Value Date   HGBA1C 5.9 (H) 07/19/2022   Lab Results  Component Value Date   VITAMINB12 459 05/01/2022   Lab Results  Component Value Date   TSH 2.870 08/21/2022    Head CT 07/01/2022 No acute intracranial abnormality noted.     ASSESSMENT AND PLAN  52 y.o. year old female with  obesity, atrial fibrillation, hypertension, hyperlipidemia, neuropathy, COPD and asthma who is presenting with new onset headache since sheet rock fell on top of her head.  She denies loss of consciousness.  Her head CT was negative no hematoma.  Patient likely had a concussion and postconcussive headaches/tension headache.   Review of medication patient list showed that she is already on gabapentin and Topamax but she has discontinued Topamax.  Topamax was prescribed for weight loss.  At this time I will recommend patient to continue with gabapentin, restart Topamax at 100 mg nightly and to start magnesium oxide 400 mg nightly.  Advised her to continue with Tylenol and ibuprofen as needed but no more than 3 days a week.  Also advised her to contact us if her headaches are not improving.  Continue to follow with PCP and return as needed.   1. Tension headache   2. Concussion without loss of consciousness, subsequent encounter     Patient Instructions  Trial of Magnesium oxide 400 mg nightly  Restart Topamax 100 mg nightly  Continue with Gabapentin Continue with Tylenol and Ibuprofen as needed for the headaches but no more than 3 days a week  Follow up as needed    No orders of the defined types were placed in this encounter.   No orders of the defined types were placed in this encounter.   Return if symptoms worsen or fail to improve.    Windell Norfolk, MD 09/17/2022, 2:40 PM  [facility] [address] [place], [place] [address] [phone]

## 2022-09-17 NOTE — Patient Instructions (Signed)
Trial of Magnesium oxide 400 mg nightly  Restart Topamax 100 mg nightly  Continue with Gabapentin Continue with Tylenol and Ibuprofen as needed for the headaches but no more than 3 days a week  Follow up as needed

## 2022-09-18 ENCOUNTER — Encounter: Payer: Self-pay | Admitting: Internal Medicine

## 2022-09-18 ENCOUNTER — Other Ambulatory Visit: Payer: Self-pay

## 2022-09-18 ENCOUNTER — Other Ambulatory Visit (HOSPITAL_COMMUNITY): Payer: Self-pay

## 2022-09-19 ENCOUNTER — Other Ambulatory Visit (INDEPENDENT_AMBULATORY_CARE_PROVIDER_SITE_OTHER): Payer: 59

## 2022-09-19 ENCOUNTER — Ambulatory Visit (INDEPENDENT_AMBULATORY_CARE_PROVIDER_SITE_OTHER): Payer: 59 | Admitting: Orthopaedic Surgery

## 2022-09-19 DIAGNOSIS — M1712 Unilateral primary osteoarthritis, left knee: Secondary | ICD-10-CM | POA: Diagnosis not present

## 2022-09-19 DIAGNOSIS — M79672 Pain in left foot: Secondary | ICD-10-CM | POA: Diagnosis not present

## 2022-09-19 DIAGNOSIS — M1711 Unilateral primary osteoarthritis, right knee: Secondary | ICD-10-CM | POA: Diagnosis not present

## 2022-09-19 MED ORDER — METHYLPREDNISOLONE ACETATE 40 MG/ML IJ SUSP
40.0000 mg | INTRAMUSCULAR | Status: AC | PRN
Start: 2022-09-19 — End: 2022-09-19
  Administered 2022-09-19: 40 mg via INTRA_ARTICULAR

## 2022-09-19 MED ORDER — LIDOCAINE HCL 1 % IJ SOLN
2.0000 mL | INTRAMUSCULAR | Status: AC | PRN
Start: 2022-09-19 — End: 2022-09-19
  Administered 2022-09-19: 2 mL

## 2022-09-19 MED ORDER — BUPIVACAINE HCL 0.5 % IJ SOLN
2.0000 mL | INTRAMUSCULAR | Status: AC | PRN
Start: 2022-09-19 — End: 2022-09-19
  Administered 2022-09-19: 2 mL via INTRA_ARTICULAR

## 2022-09-19 NOTE — Progress Notes (Addendum)
Office Visit Note   Patient: April [name]           Date of Birth: 1970-05-08           MRN: [id number] Visit Date: 09/19/2022              Requested by: April Register, MD [address] [place],  [place] [address] PCP: April Register, MD   Assessment & Plan: Visit Diagnoses:  1. Pain in left foot   2. Primary osteoarthritis of right knee   3. Primary osteoarthritis of left knee     Plan: Impression is bilateral knee osteoarthritis and left posterior tibial tendon dysfunction.  Both knees injected with cortisone today.  She will make all efforts at weight loss.  Referral to [facility] weight loss clinic.  For the left foot I recommended arch supports.  I do think part of this is compensation for the knee pain.  Follow-Up Instructions: No follow-ups on file.   Orders:  Orders Placed This Encounter  Procedures   Large Joint Inj: bilateral knee   XR Foot Complete Left   Amb Ref to Medical Weight Management   Meds ordered this encounter  Medications   bupivacaine (MARCAINE) 0.5 % (with pres) injection 2 mL   bupivacaine (MARCAINE) 0.5 % (with pres) injection 2 mL   lidocaine (XYLOCAINE) 1 % (with pres) injection 2 mL   lidocaine (XYLOCAINE) 1 % (with pres) injection 2 mL   methylPREDNISolone acetate (DEPO-MEDROL) injection 40 mg   methylPREDNISolone acetate (DEPO-MEDROL) injection 40 mg      Procedures: Large Joint Inj: bilateral knee on 09/19/2022 9:17 AM Indications: pain Details: 22 G needle  Arthrogram: No  Medications (Right): 2 mL lidocaine 1 %; 2 mL bupivacaine 0.5 %; 40 mg methylPREDNISolone acetate 40 MG/ML Medications (Left): 2 mL lidocaine 1 %; 2 mL bupivacaine 0.5 %; 40 mg methylPREDNISolone acetate 40 MG/ML Outcome: tolerated well, no immediate complications Patient was prepped and draped in the usual sterile fashion.       Clinical Data: No additional findings.   Subjective: Chief Complaint  Patient presents with   Right Knee - Pain    Left Knee - Pain   Left Foot - Pain    HPI April [name] is a 52 year old female who returns today for bilateral knee pain from DJD as well as new problem of posterior medial left foot pain.  This started this week.  She states that her right knee has been hurting worse and has had to alter her gait to compensate.  Review of Systems   Objective: Vital Signs: There were no vitals taken for this visit.  Physical Exam  Ortho Exam Examination of bilateral knees is unchanged. Examination left foot shows flexible arch.  She has tenderness along the posterior tibial tendon.  She can do a single heel raise. Specialty Comments:  No specialty comments available.  Imaging: XR Foot Complete Left  Result Date: 09/19/2022 X-rays demonstrate degenerative spurring of the midfoot.  Arthritic changes of the ankle joint.  Plantar calcaneal traction spur.    PMFS History: Patient Active Problem List   Diagnosis Date Noted   Acute asthma exacerbation 07/19/2022   Primary osteoarthritis of left knee 06/11/2022   Asthma with COPD with exacerbation (HCC) 04/28/2022   Chronic pain syndrome    Hypothyroidism 08/23/2021   Chronic heart failure with preserved ejection fraction (HFpEF) (HCC) 08/23/2021   Primary osteoarthritis of right knee 07/04/2021   Body mass index 50.0-59.9, adult (HCC)  07/04/2021   Painful orthopaedic hardware (HCC) 07/04/2021   Severe persistent asthma with exacerbation 07/04/2020   Acute respiratory failure (HCC) 02/20/2019   CHF exacerbation (HCC) 02/20/2019   Hot flashes    Asthma-COPD overlap syndrome 10/26/2018   Acute respiratory distress 08/03/2018   Vitamin D deficiency 05/26/2018   Urinary incontinence 11/17/2017   Nausea with vomiting 11/06/2017   Hypomagnesemia 10/27/2017   Dysphagia 10/27/2017   Physical deconditioning 10/25/2017   Acute maxillary sinusitis 10/24/2017   Emesis, persistent 10/08/2017   Volume depletion 10/08/2017   Chest pain 10/08/2017   SOB  (shortness of breath) 10/08/2017   Debility 09/22/2017   AKI (acute kidney injury) (HCC) 08/18/2017   HCAP (healthcare-associated pneumonia) 08/06/2017   Acute on chronic respiratory failure with hypoxemia (HCC) 08/05/2017   Chronic atrial fibrillation 07/29/2017   AF (paroxysmal atrial fibrillation) (HCC)    Dyspnea 02/04/2017   Metabolic syndrome 12/06/2016   Moderate persistent asthma with exacerbation 10/19/2016   Hypochromic anemia 10/02/2016   Elevated hemoglobin A1c 05/05/2016   GERD (gastroesophageal reflux disease) 08/30/2015   Anxiety and depression 08/30/2015   Generalized anxiety disorder 08/30/2015   Hyperlipidemia 12/08/2013   Seasonal allergic rhinitis 08/29/2013   Leukocytosis 10/21/2012   Tobacco abuse in remission 10/07/2012   Chronic obstructive pulmonary disease with acute exacerbation (HCC) 05/07/2012   Knee pain, bilateral 04/25/2011   Insomnia 03/14/2011   Obstructive sleep apnea 12/19/2010   Hypokalemia 08/13/2010   Cervical back pain with evidence of disc disease 04/08/2008   HTN (hypertension) 07/31/2006   Morbid obesity due to excess calories (HCC) 06/17/2006   Major depressive disorder, recurrent episode (HCC) 04/10/2006   Past Medical History:  Diagnosis Date   Acanthosis nigricans    Anxiety    Arthritis    "knees" (04/28/2017)   Asthma    Followed by Dr. Maple Hudson (pulmonology); receives every other week omalizumab injections; has frequent exacerbations   Back pain    Chronic diastolic CHF (congestive heart failure) (HCC) 01/17/2017   COPD (chronic obstructive pulmonary disease) (HCC)    PFTs in 2002, FEV1/FVC 65, no post bronchodilater test done   Depression    GERD (gastroesophageal reflux disease)    Headache(784.0)    "q couple days" (04/28/2017)   Helicobacter pylori (H. pylori) infection    Hypertension, essential    Insomnia    Joint pain    Lower extremity edema    Menorrhagia    Morbid obesity (HCC)    OSA on CPAP    Sleep study  2008 - mild OSA, not enough events to titrate CPAP; wears CPAP now/pt on 04/28/2017   Pneumonia X 1   Prediabetes    Rheumatoid arthritis (HCC)    Seasonal allergies    Shortness of breath    Tobacco user    Vitamin D deficiency     Family History  Problem Relation Age of Onset   Hypertension Mother    Asthma Daughter    Cancer Paternal Aunt    Asthma Maternal Grandmother     Past Surgical History:  Procedure Laterality Date   CARDIOVERSION N/A 05/30/2017   Procedure: CARDIOVERSION;  Surgeon: Thurmon Fair, MD;  Location: [facility];  Service: Cardiovascular;  Laterality: N/A;   REDUCTION MAMMAPLASTY Bilateral 09/2011   TUBAL LIGATION  1996   bilateral   Social History   Occupational History   Occupation: stay at home  Tobacco Use   Smoking status: Former    Packs/day: 0.50    Years: 26.00  Additional pack years: 0.00    Total pack years: 13.00    Types: Cigarettes    Quit date: 09/12/2014    Years since quitting: 8.0   Smokeless tobacco: Never   Tobacco comments:    Has vaped before, caught in the hospital  Vaping Use   Vaping Use: Never used  Substance and Sexual Activity   Alcohol use: No   Drug use: No   Sexual activity: Never

## 2022-09-24 ENCOUNTER — Ambulatory Visit (INDEPENDENT_AMBULATORY_CARE_PROVIDER_SITE_OTHER): Payer: 59

## 2022-09-24 VITALS — BP 124/75 | HR 89 | Temp 97.8°F | Resp 20 | Ht 66.0 in | Wt 306.6 lb

## 2022-09-24 DIAGNOSIS — J4552 Severe persistent asthma with status asthmaticus: Secondary | ICD-10-CM | POA: Diagnosis not present

## 2022-09-24 DIAGNOSIS — J4489 Other specified chronic obstructive pulmonary disease: Secondary | ICD-10-CM

## 2022-09-24 MED ORDER — TEZEPELUMAB-EKKO 210 MG/1.91ML ~~LOC~~ SOSY
210.0000 mg | PREFILLED_SYRINGE | Freq: Once | SUBCUTANEOUS | Status: AC
  Administered 2022-09-24: 210 mg via SUBCUTANEOUS
  Filled 2022-09-24: qty 1.91

## 2022-09-24 NOTE — Progress Notes (Signed)
Diagnosis: Asthma  Provider:  Chilton Greathouse MD  Procedure: Injection  Tezspire (Tezepelumab), Dose: 210 mg, Site: subcutaneous, Number of injections: 1  Post Care:     Discharge: Condition: Good, Destination: Home . AVS Declined  Performed by:  Garnette Czech, RN

## 2022-10-07 ENCOUNTER — Telehealth: Payer: Self-pay | Admitting: Family Medicine

## 2022-10-07 DIAGNOSIS — Z1231 Encounter for screening mammogram for malignant neoplasm of breast: Secondary | ICD-10-CM

## 2022-10-07 NOTE — Telephone Encounter (Signed)
Copied from CRM [id number]. Topic: General - Inquiry >> Oct 07, 2022 10:18 AM De Blanch wrote: Reason for CRM: Pt is requesting to speak with Erskine Squibb. Stated its important no further details provided. Pt is requesting a callback.  Pt asked to be seen earlier by Dr. Alvis Lemmings; no earlier appointments. I advised that I had added her appointment to the waitlist in case of a cancellation. Pt is currently scheduled for 06/12.  Please advise.

## 2022-10-07 NOTE — Telephone Encounter (Signed)
Copied from CRM [id number]. Topic: General - Inquiry >> Oct 07, 2022  4:45 PM Marlow Baars wrote: Reason for CRM: The patient called in earlier and spoke with Erskine Squibb and had her appt for the 12th cancelled at that time. She has called back stating she needs that appt back or one way sooner than the July 1 that was scheduled for her. She would like Erskine Squibb to call her back as soon as possible.

## 2022-10-07 NOTE — Telephone Encounter (Signed)
This is a duplicate message.  I have already spoken to the patient

## 2022-10-07 NOTE — Telephone Encounter (Signed)
Copied from CRM [id number]. Topic: General - Other >> Oct 07, 2022 12:41 PM Santiya F wrote: Reason for CRM: Pt calling in returning a call from Erskine Squibb

## 2022-10-08 ENCOUNTER — Other Ambulatory Visit: Payer: Self-pay | Admitting: Family Medicine

## 2022-10-08 NOTE — Telephone Encounter (Signed)
Done

## 2022-10-08 NOTE — Telephone Encounter (Signed)
She is being managed by [facility] weight management.  I would recommend she have the new clinician write that for her.

## 2022-10-08 NOTE — Addendum Note (Signed)
Addended by: Hoy Register on: 10/08/2022 06:06 PM   Modules accepted: Orders

## 2022-10-09 ENCOUNTER — Ambulatory Visit: Payer: 59 | Admitting: Family Medicine

## 2022-10-09 NOTE — Telephone Encounter (Signed)
I called the patient and informed her that Dr Alvis Lemmings recommends that she have the new clinician at [facility] write the prescription for ozempic because they are providing weight management.  She said she had her appointments confused and her appointment at [facility] is actually tomorrow, 10/10/2022 and she will ask the provider then .

## 2022-10-09 NOTE — Telephone Encounter (Signed)
I called the patient and informed her that the letter for the landlord is ready for pick up. I told her that I will leave it at the front desk and she said she would come by tomorrow to get it.  I also told her that the referral was placed for the mammogram.

## 2022-10-10 ENCOUNTER — Other Ambulatory Visit (HOSPITAL_COMMUNITY): Payer: Self-pay

## 2022-10-14 ENCOUNTER — Other Ambulatory Visit (HOSPITAL_COMMUNITY): Payer: Self-pay

## 2022-10-16 ENCOUNTER — Other Ambulatory Visit (HOSPITAL_COMMUNITY): Payer: Self-pay

## 2022-10-17 ENCOUNTER — Ambulatory Visit (INDEPENDENT_AMBULATORY_CARE_PROVIDER_SITE_OTHER): Payer: 59 | Admitting: Licensed Clinical Social Worker

## 2022-10-17 DIAGNOSIS — F331 Major depressive disorder, recurrent, moderate: Secondary | ICD-10-CM

## 2022-10-17 NOTE — Progress Notes (Signed)
Pt requested audio visit at time of session  Virtual Visit via Video Note  I connected with April [name] on 10/17/22 at  1:00 PM EDT by a video enabled telemedicine application and verified that I am speaking with the correct person using two identifiers.  Location: Patient: home Provider: remote office [place], [place])   I discussed the limitations of evaluation and management by telemedicine and the availability of in person appointments. The patient expressed understanding and agreed to proceed.  I discussed the assessment and treatment plan with the patient. The patient was provided an opportunity to ask questions and all were answered. The patient agreed with the plan and demonstrated an understanding of the instructions.   The patient was advised to call back or seek an in-person evaluation if the symptoms worsen or if the condition fails to improve as anticipated.  I provided 30 minutes of non-face-to-face time during this encounter.   April [name] R Lyric Hoar, LCSW   THERAPIST PROGRESS NOTE  Session Time: 1-130p  Participation Level: Active  Behavioral Response: CasualAlertAnxious and Depressed  Type of Therapy: Individual Therapy  Treatment Goals addressed: Develop healthy thinking patterns and beliefs about self, others, and the world that lead to the alleviation and help prevent the relapse of depression per self report 3 out of 5 sessions documented   Learn and implement coping skills that result in a reduction of anxiety and worry, and improve daily functioning per pt report 3 out of 5 sessions documented   ProgressTowards Goals: Progressing  Interventions: Solution Focused and Supportive  Summary: April [name] is a 52 y.o. female who presents with improving symptoms related to depression and trauma/anxiety.  Good sleep quality and quantity.   Pt reports that she is receiving community support services through [facility] in [place], including housing resources and CST.  Pt states she sees a QP/therapist every two weeks. Discussed w/ pt the need to see [facility] exclusively due to billing reasons. Pt reflects understanding.  Pt states [facility] is done a great job with housing support and other needs. Pt recently staying in new apartment. Pt states Shelly Coss has helped with housing resources.   Clinician assisted pt with identifying current levels of chronic pain, and explored previously recommended (by medical community) interventions for treating chronic pain. Discussed overall impact of pain on daily activities, relationships, and ability/inability to engage in self care or recreational activities. Reviewed keeping balance between rest and activity, and to continue communicating with medical providers for pain management. Reviewed mindfulness and meditation as relaxation interventions. Discussed ongoing issues and with breathing and overall impact on daily functioning.   Continued recommendations are as follows: self care behaviors, positive social engagements, focusing on overall work/home/life balance, and focusing on positive physical and emotional wellness.   Suicidal/Homicidal: No  Therapist Response: Pt is continuing to apply interventions learned in session into daily life situations. Pt is currently on track to meet goals utilizing interventions mentioned above. Personal growth and progress noted. Treatment to continue as indicated.   Plan: Return again in 4 weeks.  Diagnosis:  Encounter Diagnosis  Name Primary?   Moderate episode of recurrent major depressive disorder (HCC) Yes   Collaboration of Care: Other pt encouraged to continue care with psychiatric provider of record, Dr. Karsten Ro.   Patient/Guardian was advised Release of Information must be obtained prior to any record release in order to collaborate their care with an outside provider. Patient/Guardian was advised if they have not already done so to contact the  registration department to sign all  necessary forms in order for [place] to release information regarding their care.   Consent: Patient/Guardian gives verbal consent for treatment and assignment of benefits for services provided during this visit. Patient/Guardian expressed understanding and agreed to proceed.   Ernest Haber Timithy Arons, LCSW 10/17/2022

## 2022-10-22 ENCOUNTER — Ambulatory Visit: Payer: 59

## 2022-10-22 ENCOUNTER — Ambulatory Visit (INDEPENDENT_AMBULATORY_CARE_PROVIDER_SITE_OTHER): Payer: 59

## 2022-10-22 VITALS — BP 118/88 | HR 70 | Temp 97.7°F | Resp 20 | Ht 66.0 in | Wt 302.0 lb

## 2022-10-22 DIAGNOSIS — J4552 Severe persistent asthma with status asthmaticus: Secondary | ICD-10-CM | POA: Diagnosis not present

## 2022-10-22 DIAGNOSIS — J4489 Other specified chronic obstructive pulmonary disease: Secondary | ICD-10-CM

## 2022-10-22 MED ORDER — TEZEPELUMAB-EKKO 210 MG/1.91ML ~~LOC~~ SOSY
210.0000 mg | PREFILLED_SYRINGE | Freq: Once | SUBCUTANEOUS | Status: AC
  Administered 2022-10-22: 210 mg via SUBCUTANEOUS
  Filled 2022-10-22: qty 1.91

## 2022-10-22 NOTE — Progress Notes (Signed)
Diagnosis: Asthma  Provider:  Chilton Greathouse MD  Procedure: Injection  Tezspire (Tezepelumab), Dose: 210 mg, Site: subcutaneous, Number of injections: 1  Administered in left arm.  Post Care: Patient declined observation  Discharge: Condition: Stable, Destination: Home . AVS Declined  Performed by:  Wyvonne Lenz, RN

## 2022-10-24 IMAGING — CR DG CHEST 2V
2 series · 2 of 2 positions shown · non-contrast
Comparison: 08/26/2021

CLINICAL DATA: Shortness of breath, chest pain

EXAM:
CHEST - 2 VIEW

[w chest pa]
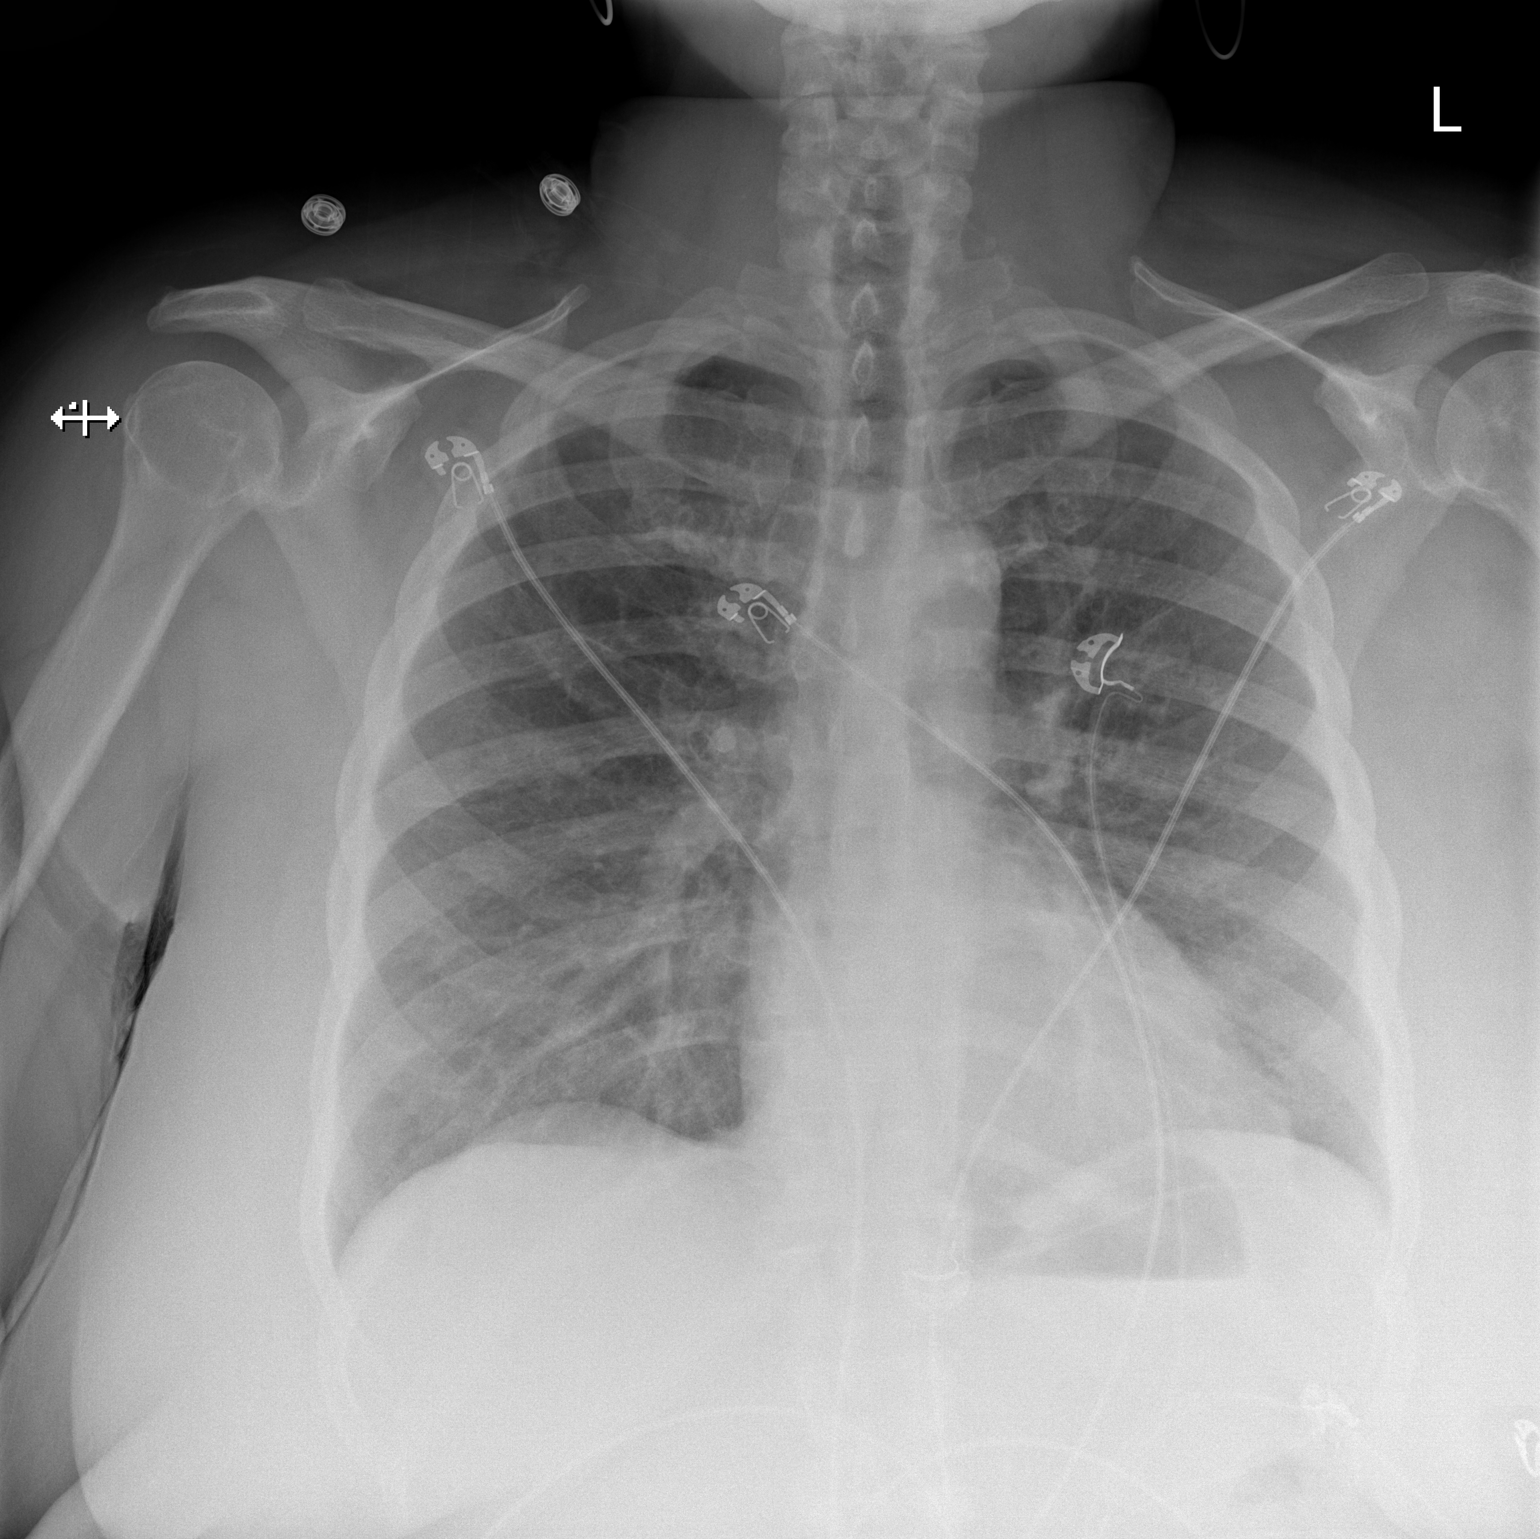

[w chest lat]
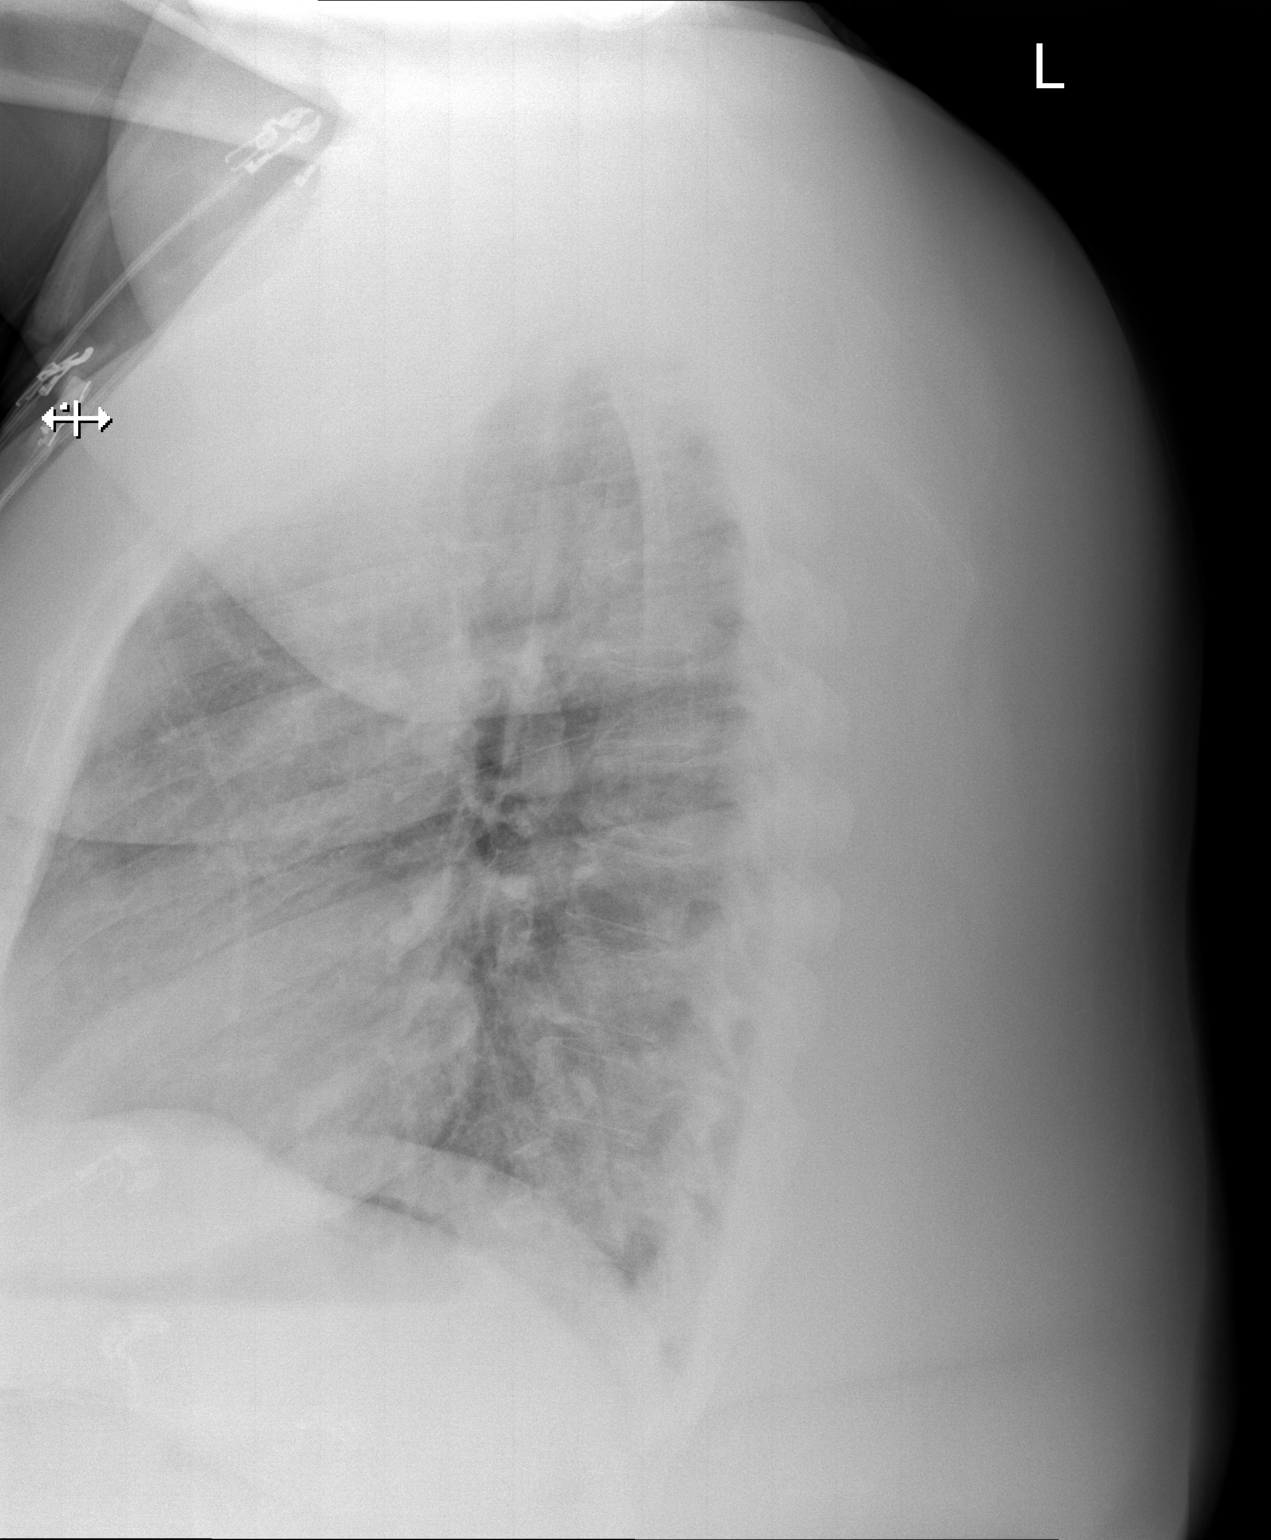

[2 of 2 positions shown; findings below may reference images not displayed]

FINDINGS: The heart size and mediastinal contours are within normal limits.
Both lungs are clear. The visualized skeletal structures are
unremarkable.
IMPRESSION: No active cardiopulmonary disease.

## 2022-10-30 IMAGING — CT CT CHEST HIGH RESOLUTION
2 of 8 series · 13 of 36 positions shown, 16 images · non-contrast
Comparison: Multiple priors, most recent chest CT dated [REDACTED]
1417; chest CT dated [DATE]; chest CT dated November 07, 2017

CLINICAL DATA: Shortness of breath

EXAM:
CT CHEST WITHOUT CONTRAST
TECHNIQUE: Multidetector CT imaging of the chest was performed following the
standard protocol without intravenous contrast. High resolution
imaging of the lungs, as well as inspiratory and expiratory imaging,
was performed.
RADIATION DOSE REDUCTION: This exam was performed according to the
departmental dose-optimization program which includes automated
exposure control, adjustment of the mA and/or kV according to
patient size and/or use of iterative reconstruction technique.

[Series 4: high resolution retro · axial · 0.75mm/px · z∈[+1253,+1474]mm · 10 of 271 slices shown, 13 images]
[im 25/271  mediastinal]
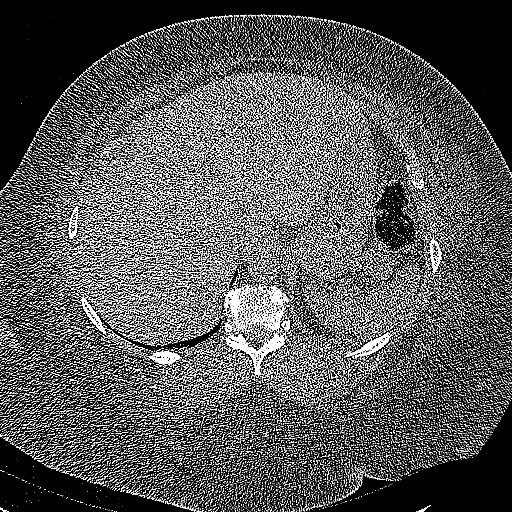
[im 25/271  lung]
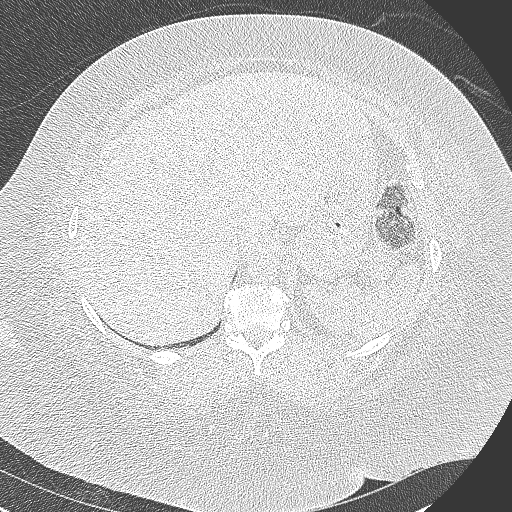
[im 50/271  lung]
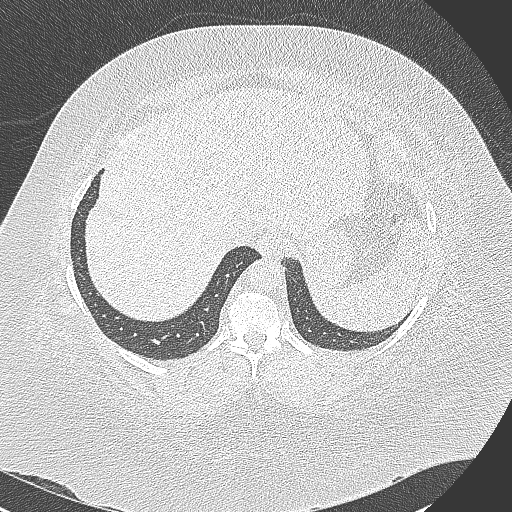
[im 74/271  lung]
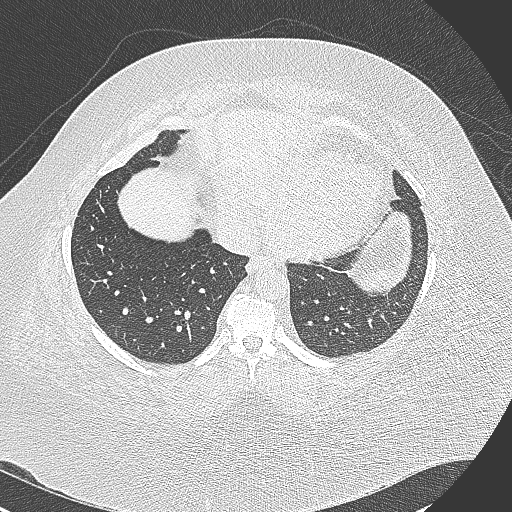
[im 99/271  lung]
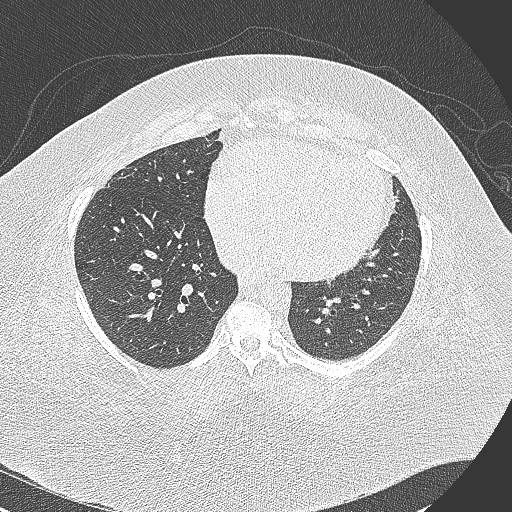
[im 123/271  mediastinal]
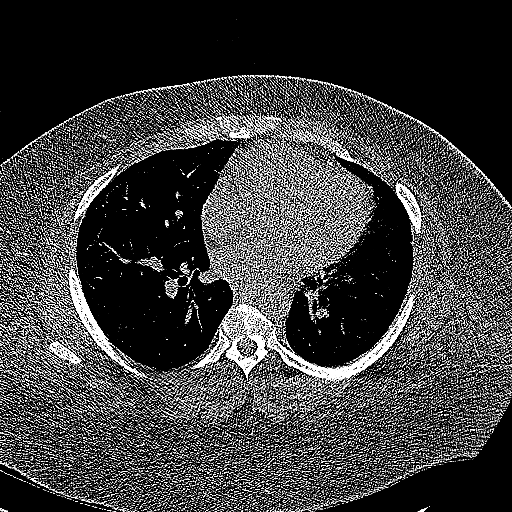
[im 123/271  lung]
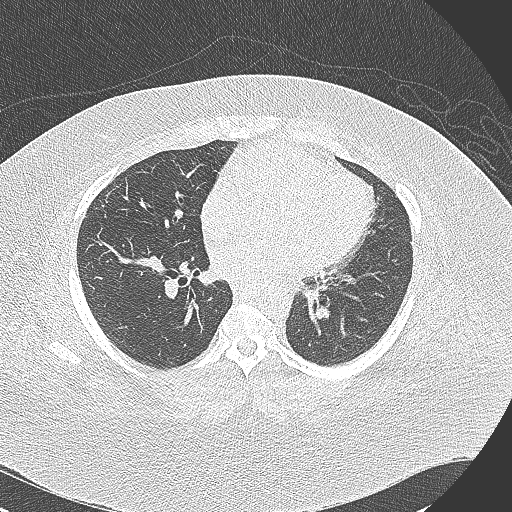
[im 148/271  lung]
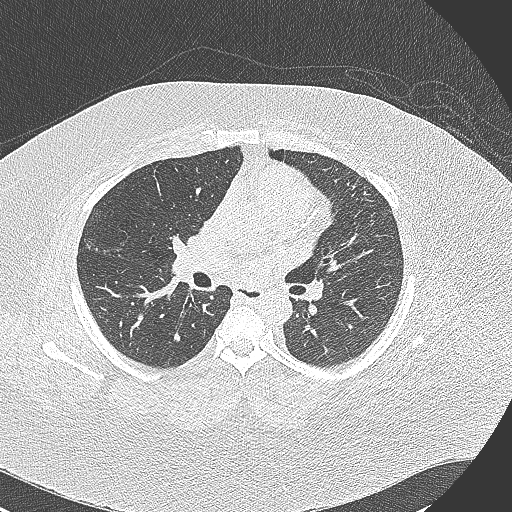
[im 172/271  lung]
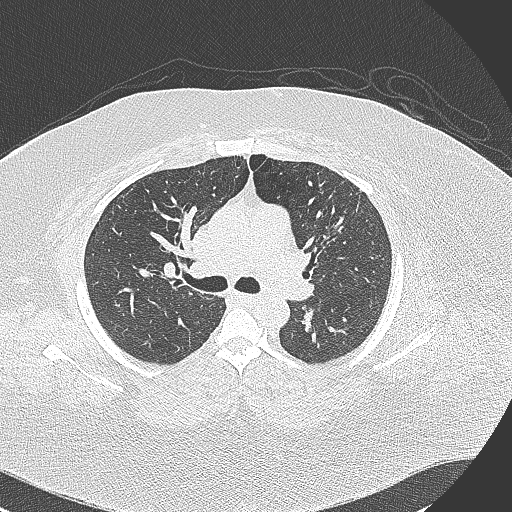
[im 197/271  lung]
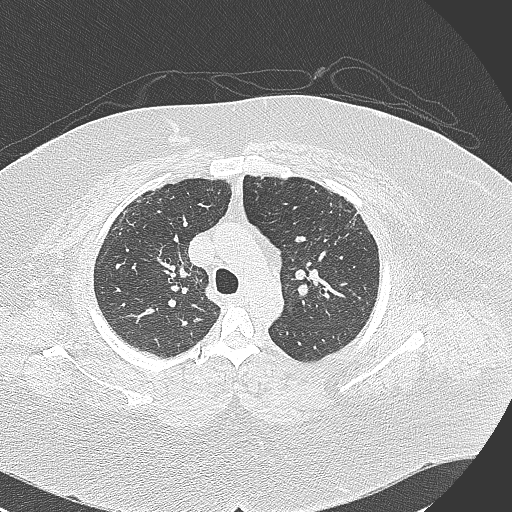
[im 221/271  mediastinal]
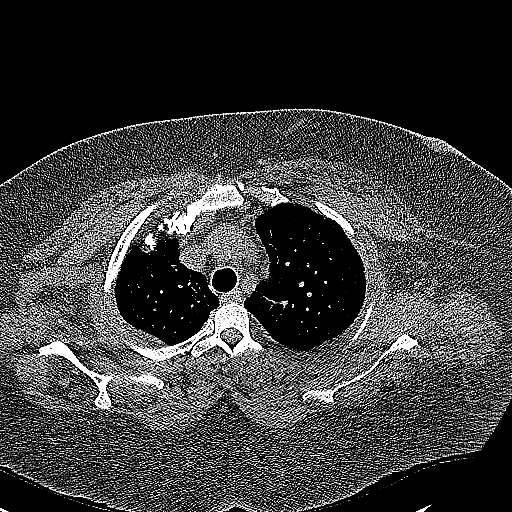
[im 221/271  lung]
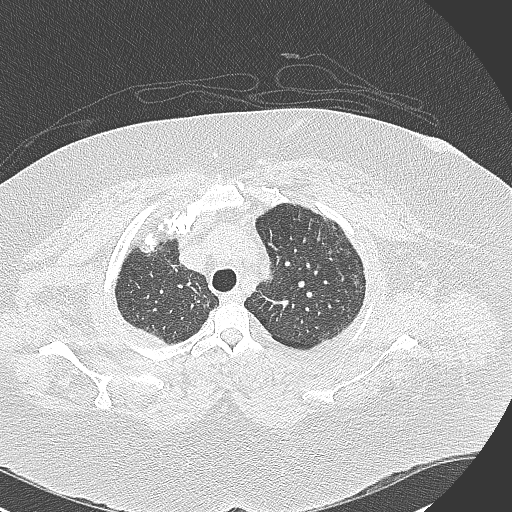
[im 246/271  lung]
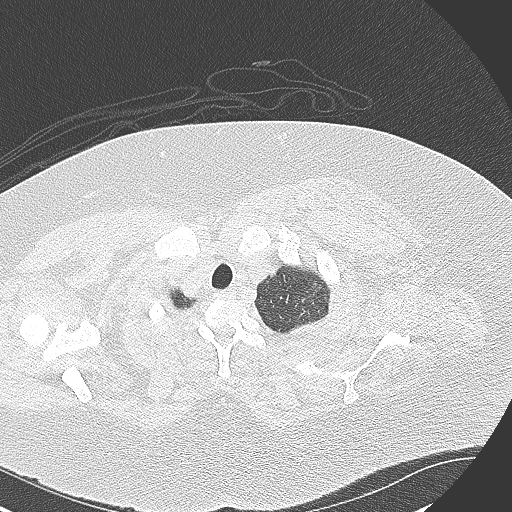

[Series 7: coronal · coronal · 0.60mm/px · 3 of 101 slices shown]
[im 21/101  lung]
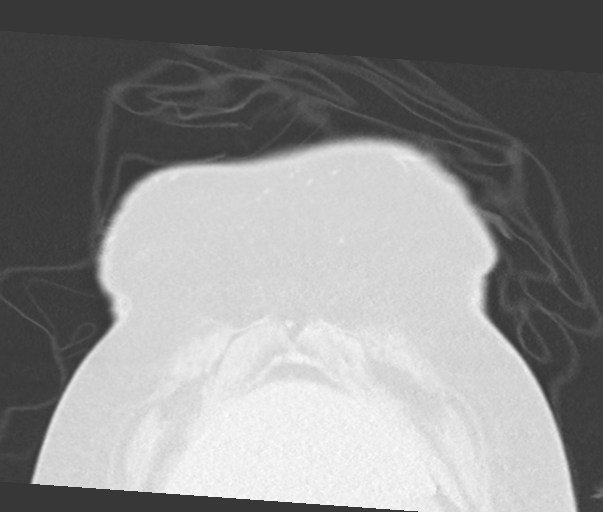
[im 41/101  lung]
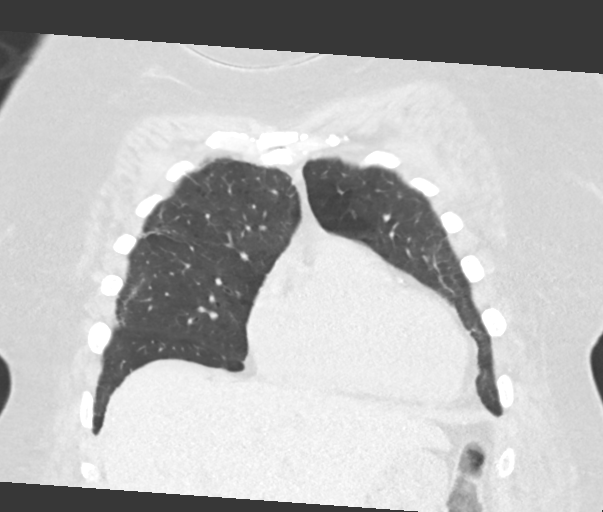
[im 61/101  lung]
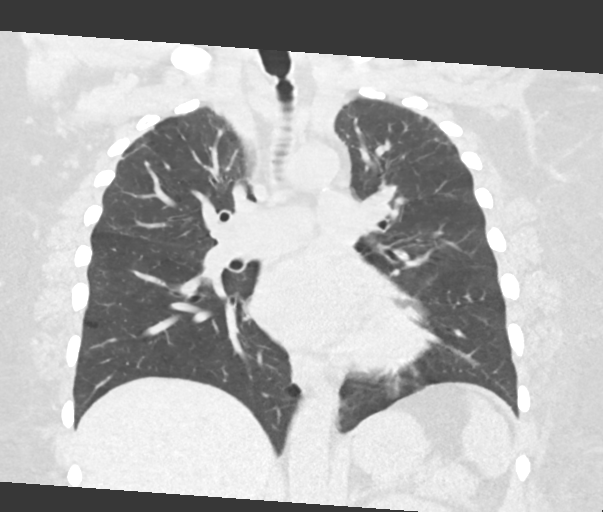

[13 of 36 positions shown; findings below may reference images not displayed]

FINDINGS: Cardiovascular: Mild cardiomegaly. No pericardial effusion. Moderate
coronary artery calcifications of the LAD. No significant
atherosclerotic disease of the thoracic aorta.

Mediastinum/Nodes: Thyroid and esophagus are unremarkable. No
pathologically enlarged lymph nodes seen in the chest.

Lungs/Pleura: Central airways are patent. Expiratory phase images on
today's exam are inadequate since obtained during inspiration,
however on prior CTs with expiratory phase imaging there is no
evidence of air trapping. Mild centrilobular and paraseptal
emphysema. Mild upper and anterior lung predominant subpleural and
peribronchovascular reticular opacities with no evidence of traction
bronchiectasis or honeycomb change. Findings are new when compared
with January 27, 2017 exam, but improved when compared with January 27, 2017 exam. Mild focal bronchiectasis in the lingula with associated
linear opacities, unchanged when compared with most recent prior
exam and likely sequela of prior infection or aspiration. No
consolidation, pleural effusion or pneumothorax.

Upper Abdomen: No acute abnormality.

Musculoskeletal: No chest wall mass or suspicious bone lesions
identified.
IMPRESSION: 1. No acute airspace opacity.
2. Mild upper and anterior lung predominant subpleural and
peribronchovascular reticular opacities with no traction
bronchiectasis or evidence of air trapping, findings are new when
compared with November 07, 2017 exam, but improved when compared with
January 27, 2017 exam. Findings are likely due to scarring related to
prior acute lung injury.
3. Moderate coronary artery calcifications of the LAD.
4. Mild emphysema ([id number]).

## 2022-11-04 ENCOUNTER — Ambulatory Visit (INDEPENDENT_AMBULATORY_CARE_PROVIDER_SITE_OTHER): Payer: 59 | Admitting: Licensed Clinical Social Worker

## 2022-11-04 DIAGNOSIS — F411 Generalized anxiety disorder: Secondary | ICD-10-CM

## 2022-11-04 DIAGNOSIS — F331 Major depressive disorder, recurrent, moderate: Secondary | ICD-10-CM

## 2022-11-04 DIAGNOSIS — F4310 Post-traumatic stress disorder, unspecified: Secondary | ICD-10-CM | POA: Diagnosis not present

## 2022-11-04 NOTE — Patient Instructions (Signed)
Outpatient Psychiatry and Counseling  FOR CRISIS:  call 911, [facility]: Mobile Crisis Management 24 hours:  [phone], call [phone], [facility] ([facility]) [address] walk in, or go to your local EMERGENCY DEPARTMENT  [facility] sliding scale fee and walk in schedule: M-F 8am-12pm/1pm-3pm [address]  [place], [place] [address] [phone]  [facility] [address] [place], [place] [address] [phone]  [facility]/ Intensive Outpatient Therapy Program/CDIOP/PHP [address] [place], [place] [address] [phone]  [facility]                  Crisis Services, Outpatient Therapy Services, Walk in Services      [phone]     [address]    [place], [place] [address]                 [facility]   [facility] [phone] [address] [place], [place] [address]  [facility]          [address],  [place], [place] [address]       [phone]  [facility] [address] [place], [place] [address] [phone]  [facility]    [address]    [place], [place] [address]     [phone]       [facility] [address] [place] [address]  [facility]     [address]     [place], [place]      [phone]       [facility] Shamsher Ahluwalia, MD [address] [place], [place] [address] [phone]  [facility]     [address]     [place], [place] [address]     [phone]       [facility] [address] [place], [place] [address] [phone] Resources for Temporary Residential Assistance/Crisis Centers  

## 2022-11-04 NOTE — Progress Notes (Signed)
Pt requested audio visit at time of session  Virtual Visit via Video Note  I connected with April [name] on 11/04/22 at  3:00 PM EDT by a video enabled telemedicine application and verified that I am speaking with the correct person using two identifiers.  Location: Patient: home Provider: remote office [place], [place])   I discussed the limitations of evaluation and management by telemedicine and the availability of in person appointments. The patient expressed understanding and agreed to proceed.  I discussed the assessment and treatment plan with the patient. The patient was provided an opportunity to ask questions and all were answered. The patient agreed with the plan and demonstrated an understanding of the instructions.   The patient was advised to call back or seek an in-person evaluation if the symptoms worsen or if the condition fails to improve as anticipated.  I provided 20 minutes of non-face-to-face time during this encounter.   Quincee Gittens R Tiny Rietz, LCSW   THERAPIST PROGRESS NOTE  Session Time: 3-320p  Participation Level: Active  Behavioral Response: CasualAlertAnxious and Depressed  Type of Therapy: Individual Therapy  Treatment Goals addressed: Develop healthy thinking patterns and beliefs about self, others, and the world that lead to the alleviation and help prevent the relapse of depression per self report 3 out of 5 sessions documented   Learn and implement coping skills that result in a reduction of anxiety and worry, and improve daily functioning per pt report 3 out of 5 sessions documented   ProgressTowards Goals: Progressing  Interventions: Solution Focused and Supportive  Summary: April [name] is a 52 y.o. female who presents with improving symptoms related to depression and trauma/anxiety.  Good sleep quality and quantity.   Clinician assisted pt with identifying current levels of chronic pain, and explored previously recommended (by medical  community) interventions for treating chronic pain. Discussed overall impact of pain on daily activities, relationships, and ability/inability to engage in self care or recreational activities. Reviewed keeping balance between rest and activity, and to continue communicating with medical providers for pain management. Reviewed mindfulness and meditation as relaxation interventions.   Encouraged pt to continue treatment with [facility] services.   Continued recommendations are as follows: self care behaviors, positive social engagements, focusing on overall work/home/life balance, and focusing on positive physical and emotional wellness.   Suicidal/Homicidal: No  Therapist Response: Pt is continuing to apply interventions learned in session into daily life situations. Pt is currently on track to meet goals utilizing interventions mentioned above. Personal growth and progress noted. Treatment to continue as indicated.   Plan: Informed patient that clinician will be leaving outpatient department. Allowed pt to explore any questions or concerns and discussed future counseling options/resources. Provided pt with psychoeducational resources and list of OPT therapists. Encouraged pt to continue with psychiatric med management appointments, if applicable.   Diagnosis:  Encounter Diagnoses  Name Primary?   Moderate episode of recurrent major depressive disorder (HCC) Yes   PTSD (post-traumatic stress disorder)    Generalized anxiety disorder    Collaboration of Care: Other pt encouraged to continue care with psychiatric provider of record, Dr. Karsten Ro.   Patient/Guardian was advised Release of Information must be obtained prior to any record release in order to collaborate their care with an outside provider. Patient/Guardian was advised if they have not already done so to contact the registration department to sign all necessary forms in order for [place] to release information regarding their care.    Consent: Patient/Guardian gives verbal consent  for treatment and assignment of benefits for services provided during this visit. Patient/Guardian expressed understanding and agreed to proceed.   Ernest Haber Alycia [place]wood, LCSW 11/04/2022

## 2022-11-06 ENCOUNTER — Encounter: Payer: Self-pay | Admitting: Internal Medicine

## 2022-11-06 ENCOUNTER — Telehealth: Payer: Self-pay | Admitting: Orthopaedic Surgery

## 2022-11-06 ENCOUNTER — Ambulatory Visit
Admission: RE | Admit: 2022-11-06 | Discharge: 2022-11-06 | Disposition: A | Discharge status: Home / Self Care | Payer: 59 | Source: Ambulatory Visit | Attending: Family Medicine | Admitting: Family Medicine

## 2022-11-06 DIAGNOSIS — Z1231 Encounter for screening mammogram for malignant neoplasm of breast: Secondary | ICD-10-CM

## 2022-11-06 NOTE — Telephone Encounter (Signed)
Pt requesting Gel injection for both knees please advise

## 2022-11-08 ENCOUNTER — Other Ambulatory Visit: Payer: Self-pay | Admitting: Internal Medicine

## 2022-11-08 ENCOUNTER — Other Ambulatory Visit (HOSPITAL_COMMUNITY): Payer: Self-pay

## 2022-11-08 DIAGNOSIS — J4541 Moderate persistent asthma with (acute) exacerbation: Secondary | ICD-10-CM

## 2022-11-08 DIAGNOSIS — J4489 Other specified chronic obstructive pulmonary disease: Secondary | ICD-10-CM

## 2022-11-11 ENCOUNTER — Other Ambulatory Visit: Payer: Self-pay | Admitting: Internal Medicine

## 2022-11-11 ENCOUNTER — Other Ambulatory Visit (HOSPITAL_COMMUNITY): Payer: Self-pay

## 2022-11-11 DIAGNOSIS — J4541 Moderate persistent asthma with (acute) exacerbation: Secondary | ICD-10-CM

## 2022-11-11 DIAGNOSIS — J4489 Other specified chronic obstructive pulmonary disease: Secondary | ICD-10-CM

## 2022-11-11 NOTE — Telephone Encounter (Signed)
VOB submitted for Durolane, bilateral knee  

## 2022-11-12 ENCOUNTER — Other Ambulatory Visit: Payer: Self-pay

## 2022-11-12 ENCOUNTER — Other Ambulatory Visit (HOSPITAL_COMMUNITY): Payer: Self-pay

## 2022-11-12 MED ORDER — TEZSPIRE 210 MG/1.91ML ~~LOC~~ SOAJ
210.0000 mg | SUBCUTANEOUS | 5 refills | Status: DC
Start: 2022-11-12 — End: 2023-04-29
  Filled 2022-11-12: qty 1.91, 28d supply, fill #0
  Filled 2022-12-10: qty 1.91, 28d supply, fill #1
  Filled 2023-01-01: qty 1.91, 28d supply, fill #2
  Filled 2023-01-31: qty 1.91, 28d supply, fill #3
  Filled 2023-03-06: qty 1.91, 28d supply, fill #4
  Filled 2023-04-02: qty 1.91, 28d supply, fill #5

## 2022-11-12 NOTE — Telephone Encounter (Signed)
Refill sent for TEZSPIRE to [facility]: [phone]   Dose: 210 mg SQ every 4 weeks  Last OV: 07/09/22 Provider: Dr. Maple Hudson  Next OV: 12/31/22  Chesley Mires, PharmD, MPH, BCPS Clinical Pharmacist (Rheumatology and Pulmonology)

## 2022-11-17 ENCOUNTER — Other Ambulatory Visit: Payer: Self-pay | Admitting: Internal Medicine

## 2022-11-18 ENCOUNTER — Ambulatory Visit: Payer: 59 | Attending: Family Medicine | Admitting: Family Medicine

## 2022-11-18 ENCOUNTER — Encounter: Payer: Self-pay | Admitting: Family Medicine

## 2022-11-18 VITALS — BP 148/91 | Temp 98.0°F | Ht 66.0 in | Wt 314.2 lb

## 2022-11-18 DIAGNOSIS — E1169 Type 2 diabetes mellitus with other specified complication: Secondary | ICD-10-CM | POA: Diagnosis not present

## 2022-11-18 DIAGNOSIS — I152 Hypertension secondary to endocrine disorders: Secondary | ICD-10-CM

## 2022-11-18 DIAGNOSIS — E1149 Type 2 diabetes mellitus with other diabetic neurological complication: Secondary | ICD-10-CM | POA: Diagnosis not present

## 2022-11-18 DIAGNOSIS — E038 Other specified hypothyroidism: Secondary | ICD-10-CM

## 2022-11-18 DIAGNOSIS — Z7985 Long-term (current) use of injectable non-insulin antidiabetic drugs: Secondary | ICD-10-CM | POA: Diagnosis not present

## 2022-11-18 DIAGNOSIS — Z79899 Other long term (current) drug therapy: Secondary | ICD-10-CM

## 2022-11-18 DIAGNOSIS — E669 Obesity, unspecified: Secondary | ICD-10-CM

## 2022-11-18 DIAGNOSIS — J4541 Moderate persistent asthma with (acute) exacerbation: Secondary | ICD-10-CM

## 2022-11-18 DIAGNOSIS — E1159 Type 2 diabetes mellitus with other circulatory complications: Secondary | ICD-10-CM

## 2022-11-18 DIAGNOSIS — F419 Anxiety disorder, unspecified: Secondary | ICD-10-CM

## 2022-11-18 DIAGNOSIS — Z6841 Body Mass Index (BMI) 40.0 and over, adult: Secondary | ICD-10-CM

## 2022-11-18 LAB — POCT GLYCOSYLATED HEMOGLOBIN (HGB A1C): HbA1c, POC (controlled diabetic range): 5.4 % (ref 0.0–7.0)

## 2022-11-18 MED ORDER — METHYLPREDNISOLONE SODIUM SUCC 125 MG IJ SOLR
125.0000 mg | Freq: Once | INTRAMUSCULAR | Status: AC
Start: 2022-11-18 — End: 2022-11-18
  Administered 2022-11-18: 125 mg via INTRAMUSCULAR

## 2022-11-18 MED ORDER — ATORVASTATIN CALCIUM 20 MG PO TABS: 20.0000 mg | ORAL_TABLET | Freq: Every day | ORAL | 1 refills | Status: DC

## 2022-11-18 NOTE — Progress Notes (Signed)
Subjective:  Patient ID: April [name], female    DOB: 1970/12/28  Age: 52 y.o. MRN: [id number]  CC: Asthma   HPI April [name] is a 52 y.o. year old female with a history of morbid obesity, major depressive disorder, hypertension, COPD/ Asthma , paroxysmal atrial fibrillation obstructive sleep apnea (not compliant with CPAP) with multiple hospitalizations for asthma exacerbation/COPD , steroid-induced diabetes  (previously on metformin) .   Interval History: Discussed the use of AI scribe software for clinical note transcription with the patient, who gave verbal consent to proceed.  She presents with wheezing that started today. She reports feeling like she can't move much air and has been up since 4 am due to difficulty breathing. She took 30mg  of prednisone this morning in response to her symptoms. She has been using her Trelegy inhaler regularly and took Singulair last night. She also receives Tezspire every 28 days.  In addition to her respiratory symptoms, the patient reports burning pain in the bottoms of her feet, which she describes as intermittent. She has been taking pregabalin for this symptom, but it does not seem to be providing relief. She also reports taking clonazepam for anxiety and sleep, and she has been prescribed Ozempic for weight management. This was previously prescribed by [facility] weight management but she states the Physician left the practice.  She has a history of hypothyroidism and is taking levothyroxine.        Past Medical History:  Diagnosis Date   Acanthosis nigricans    Anxiety    Arthritis    "knees" (04/28/2017)   Asthma    Followed by Dr. Maple Hudson (pulmonology); receives every other week omalizumab injections; has frequent exacerbations   Back pain    Chronic diastolic CHF (congestive heart failure) (HCC) 01/17/2017   COPD (chronic obstructive pulmonary disease) (HCC)    PFTs in 2002, FEV1/FVC 65, no post bronchodilater test done   Depression    GERD  (gastroesophageal reflux disease)    Headache(784.0)    "q couple days" (04/28/2017)   Helicobacter pylori (H. pylori) infection    Hypertension, essential    Insomnia    Joint pain    Lower extremity edema    Menorrhagia    Morbid obesity (HCC)    OSA on CPAP    Sleep study 2008 - mild OSA, not enough events to titrate CPAP; wears CPAP now/pt on 04/28/2017   Pneumonia X 1   Prediabetes    Rheumatoid arthritis (HCC)    Seasonal allergies    Shortness of breath    Tobacco user    Vitamin D deficiency     Past Surgical History:  Procedure Laterality Date   CARDIOVERSION N/A 05/30/2017   Procedure: CARDIOVERSION;  Surgeon: Thurmon Fair, MD;  Location: [facility];  Service: Cardiovascular;  Laterality: N/A;   REDUCTION MAMMAPLASTY Bilateral 09/2011   TUBAL LIGATION  1996   bilateral    Family History  Problem Relation Age of Onset   Hypertension Mother    Asthma Daughter    Cancer Paternal Aunt    Asthma Maternal Grandmother     Social History   Socioeconomic History   Marital status: Single    Spouse name: Not on file   Number of children: 1   Years of education: Not on file   Highest education level: Not on file  Occupational History   Occupation: stay at home  Tobacco Use   Smoking status: Former    Current packs/day: 0.00  Average packs/day: 0.5 packs/day for 26.0 years (13.0 ttl pk-yrs)    Types: Cigarettes    Start date: 09/11/1988    Quit date: 09/12/2014    Years since quitting: 8.1   Smokeless tobacco: Never   Tobacco comments:    Has vaped before, caught in the hospital  Vaping Use   Vaping status: Never Used  Substance and Sexual Activity   Alcohol use: No   Drug use: No   Sexual activity: Never  Other Topics Concern   Not on file  Social History Narrative   Daughters are grown, not married, works as a Lawyer.   Social Determinants of Health   Financial Resource Strain: High Risk (01/30/2022)   Overall Financial Resource Strain (CARDIA)     Difficulty of Paying Living Expenses: Very hard  Food Insecurity: No Food Insecurity (07/19/2022)   Hunger Vital Sign    Worried About Running Out of Food in the Last Year: Never true    Ran Out of Food in the Last Year: Never true  Transportation Needs: No Transportation Needs (07/19/2022)   PRAPARE - Administrator, Civil Service (Medical): No    Lack of Transportation (Non-Medical): No  Physical Activity: Insufficiently Active (01/30/2022)   Exercise Vital Sign    Days of Exercise per Week: 2 days    Minutes of Exercise per Session: 30 min  Stress: Stress Concern Present (01/30/2022)   [employer] of Occupational Health - Occupational Stress Questionnaire    Feeling of Stress : Very much  Social Connections: Socially Isolated (01/30/2022)   Social Connection and Isolation Panel [NHANES]    Frequency of Communication with Friends and Family: More than three times a week    Frequency of Social Gatherings with Friends and Family: Three times a week    Attends Religious Services: Never    Active Member of Clubs or Organizations: No    Attends Banker Meetings: Never    Marital Status: Never married    Allergies  Allergen Reactions   Other Shortness Of Breath and Other (See Comments)    Seasonal allergies- Wheezing   Contrast Media [Iodinated Contrast Media] Itching and Other (See Comments)    CT contrast    Outpatient Medications Prior to Visit  Medication Sig Dispense Refill   acetaminophen (TYLENOL) 500 MG tablet Take 500-1,000 mg by mouth every 6 (six) hours as needed for mild pain or headache.     albuterol (PROAIR HFA) 108 (90 Base) MCG/ACT inhaler Inhale 2 puffs into the lungs every 6 (six) hours as needed for wheezing or shortness of breath. (Patient taking differently: Inhale 2 puffs into the lungs See admin instructions. Inhale 2 puffs into the lungs every 2-4 hours as needed for shortness of breath or wheezing) 8.5 g 11   ALEVE 220 MG tablet  Take 220-440 mg by mouth 2 (two) times daily as needed (for pain or headaches).     amLODipine (NORVASC) 10 MG tablet Take 1 tablet (10 mg total) by mouth daily. 90 tablet 1   apixaban (ELIQUIS) 5 MG TABS tablet Take 1 tablet (5 mg total) by mouth 2 (two) times daily. 180 tablet 1   baclofen (LIORESAL) 20 MG tablet Take 20-40 mg by mouth 3 (three) times daily as needed for muscle spasms.     carvedilol (COREG) 6.25 MG tablet Take 1 tablet (6.25 mg total) by mouth 2 (two) times daily with a meal. TAKE 1 TABLET(6.25 MG) BY MOUTH TWICE DAILY WITH A  MEAL Strength: 6.25 mg 180 tablet 1   clonazePAM (KLONOPIN) 0.5 MG tablet 1 twice daily as needed for anxiety (Patient taking differently: Take 0.5 mg by mouth in the morning and at bedtime.) 60 tablet 5   cloNIDine (CATAPRES) 0.2 MG tablet Take 1 tablet (0.2 mg total) by mouth 2 (two) times daily. TAKE 1 TABLET(0.2 MG) BY MOUTH TWICE DAILY Strength: 0.2 mg 180 tablet 1   EPINEPHrine 0.3 mg/0.3 mL IJ SOAJ injection Inject 0.3 mg into the muscle as needed for anaphylaxis. 1 each 5   famotidine (PEPCID) 40 MG tablet Take 40 mg by mouth daily in the afternoon.     FLUoxetine (PROZAC) 20 MG capsule Take 1 capsule (20 mg total) by mouth daily. Take 1 capsule daily. (Patient taking differently: Take 20 mg by mouth in the morning.) 30 capsule 1   fluticasone (FLONASE) 50 MCG/ACT nasal spray SHAKE LIQUID AND USE 1 SPRAY IN EACH NOSTRIL DAILY AS NEEDED FOR ALLERGIES OR RHINITIS (Patient taking differently: Place 1 spray into both nostrils daily.) 16 g 0   Fluticasone-Umeclidin-Vilant (TRELEGY ELLIPTA) 200-62.5-25 MCG/ACT AEPB Inhale 1 puff into the lungs daily. 60 each 5   furosemide (LASIX) 20 MG tablet TAKE 1 TABLET(20 MG) BY MOUTH DAILY 90 tablet 0   ipratropium-albuterol (DUONEB) 0.5-2.5 (3) MG/3ML SOLN 1 neb every 6 hours if needed for asthma (Patient taking differently: Take 3 mLs by nebulization every 4 (four) hours as needed (shortness of breath).) 75 mL 12    levothyroxine (SYNTHROID) 50 MCG tablet Take 1 tablet (50 mcg total) by mouth daily. (Patient taking differently: Take 50 mcg by mouth daily before breakfast.) 90 tablet 1   montelukast (SINGULAIR) 10 MG tablet Take 1 tablet (10 mg total) by mouth at bedtime. 30 tablet 2   naloxone (NARCAN) nasal spray 4 mg/0.1 mL Place 1 spray into the nose once as needed (opioid overdose).     omeprazole (PRILOSEC) 40 MG capsule Take 40 mg by mouth See admin instructions. Take 40 mg by mouth one to two times a day before meals     oxyCODONE-acetaminophen (PERCOCET) 10-325 MG tablet Take 1 tablet by mouth 5 (five) times daily as needed for pain. (Patient taking differently: Take 1 tablet by mouth 4 (four) times daily.) 150 tablet 0   phentermine 15 MG capsule Take 15 mg by mouth daily.     polyethylene glycol (MIRALAX / GLYCOLAX) 17 g packet Take 17 g by mouth daily. 14 each 0   potassium chloride SA (KLOR-CON M) 20 MEQ tablet Take 1 tablet (20 mEq total) by mouth daily. 90 tablet 1   Semaglutide, 1 MG/DOSE, (OZEMPIC, 1 MG/DOSE,) 4 MG/3ML SOPN Inject 1 mg into the skin every Monday.     solifenacin (VESICARE) 5 MG tablet Take 1 tablet (5 mg total) by mouth daily. 90 tablet 1   Tezepelumab-ekko (TEZSPIRE) 210 MG/1. SOAJ Inject 210 mg into the skin every 28 (twenty-eight) days. 1.91 mL 5   topiramate (TOPAMAX) 200 MG tablet Take 200 mg by mouth at bedtime.     Vitamin D, Ergocalciferol, (DRISDOL) 1.25 MG (50000 UNIT) CAPS capsule Take 50,000 Units by mouth every Friday.     zolpidem (AMBIEN) 10 MG tablet TAKE 1 TABLET BY MOUTH AT BEDTIME AS NEEDED FOR SLEEP 30 tablet 5   gabapentin (NEURONTIN) 300 MG capsule Take 1 capsule (300 mg total) by mouth 3 (three) times daily. 90 capsule 3   Facility-Administered Medications Prior to Visit  Medication Dose Route Frequency Provider  Last Rate Last Admin   tezepelumab-ekko (TEZSPIRE) 210 MG/1. syringe 210 mg  210 mg Subcutaneous Once Jetty Duhamel D, MD          ROS Review of Systems  Constitutional:  Negative for activity change and appetite change.  HENT:  Negative for sinus pressure and sore throat.   Respiratory:  Positive for cough, shortness of breath and wheezing. Negative for chest tightness.   Cardiovascular:  Negative for chest pain and palpitations.  Gastrointestinal:  Negative for abdominal distention, abdominal pain and constipation.  Genitourinary: Negative.   Musculoskeletal: Negative.   Neurological:  Positive for numbness.  Psychiatric/Behavioral:  Negative for behavioral problems and dysphoric mood.     Objective:  BP (!) 148/91   Temp 98 F (36.7 C) (Oral)   Ht 5\' 6"  (1.676 m)   Wt (!) 314 lb 3.2 oz (142.5 kg)   BMI 50.71 kg/m      11/18/2022    2:01 PM 10/22/2022   11:48 AM 09/24/2022    4:04 PM  BP/Weight  Systolic BP 148 118 124  Diastolic BP 91 88 75  Wt. (Lbs) 314.2 302 306.6  BMI 50.71 kg/m2 48.74 kg/m2 49.49 kg/m2      Physical Exam Constitutional:      Appearance: She is well-developed.  Cardiovascular:     Rate and Rhythm: Normal rate.     Heart sounds: Normal heart sounds. No murmur heard. Pulmonary:     Effort: Pulmonary effort is normal.     Breath sounds: Wheezing present. No rales.     Comments: Distant breath sounds Chest:     Chest wall: No tenderness.  Abdominal:     General: Bowel sounds are normal. There is no distension.     Palpations: Abdomen is soft. There is no mass.     Tenderness: There is no abdominal tenderness.  Musculoskeletal:        General: Normal range of motion.     Right lower leg: No edema.     Left lower leg: No edema.  Neurological:     Mental Status: She is alert and oriented to person, place, and time.  Psychiatric:        Mood and Affect: Mood normal.        Latest Ref Rng & Units 07/20/2022    5:45 AM 07/19/2022    5:50 PM 07/01/2022    8:49 PM  CMP  Glucose 70 - 99 mg/dL 347  77  96   BUN 6 - 20 mg/dL 13  12  12    Creatinine 0.44 - 1.00  mg/dL 4.25  9.56  3.87   Sodium 135 - 145 mmol/L 134  132  136   Potassium 3.5 - 5.1 mmol/L 4.0  3.1  3.5   Chloride 98 - 111 mmol/L 103  99  106   CO2 22 - 32 mmol/L 21  21  21    Calcium 8.9 - 10.3 mg/dL 8.3  8.4  8.7     Lipid Panel     Component Value Date/Time   CHOL 192 10/16/2021 0506   CHOL 238 (H) 03/30/2020 1128   TRIG 51 10/16/2021 0506   HDL 99 10/16/2021 0506   HDL 45 03/30/2020 1128   CHOLHDL 1.9 10/16/2021 0506   VLDL 10 10/16/2021 0506   LDLCALC 83 10/16/2021 0506   LDLCALC 167 (H) 03/30/2020 1128    CBC    Component Value Date/Time   WBC 16.9 (H) 07/22/2022 0455   RBC  4.11 07/22/2022 0455   HGB 10.6 (L) 07/22/2022 0455   HGB 10.4 (L) 01/22/2018 1403   HCT 34.3 (L) 07/22/2022 0455   HCT 33.3 (L) 01/22/2018 1403   PLT 425 (H) 07/22/2022 0455   PLT 616 (H) 01/22/2018 1403   MCV 83.5 07/22/2022 0455   MCV 82 01/22/2018 1403   MCH 25.8 (L) 07/22/2022 0455   MCHC 30.9 07/22/2022 0455   RDW 17.5 (H) 07/22/2022 0455   RDW 17.1 (H) 01/22/2018 1403   LYMPHSABS 1.4 07/20/2022 0545   LYMPHSABS 3.3 (H) 01/22/2018 1403   MONOABS 0.3 07/20/2022 0545   EOSABS 0.0 07/20/2022 0545   EOSABS 0.3 01/22/2018 1403   BASOSABS 0.0 07/20/2022 0545   BASOSABS 0.1 01/22/2018 1403    Lab Results  Component Value Date   HGBA1C 5.4 11/18/2022    Lab Results  Component Value Date   TSH 2.870 08/21/2022    Assessment & Plan:      Acute Asthma Exacerbation: Wheezing and shortness of breath started today. Patient took 30mg  of Prednisone at home with no relief. Oxygen saturation is 99%. -Administer Solu-Medrol 125mg  injection in office. Continue with nebs -If symptoms persist, patient should go to the emergency room. -Follow up with pulmonologist, Dr. Maple Hudson.  Peripheral Neuropathy: Patient reports burning sensation in feet. Currently on Pregabalin with inadequate relief. -Med list she is on Prozac but she is unsure of this -Initiate Duloxetine for neuropathy after  medication reconciliation with clinical pharmacist as I would like to discontinue Prozac  Polypharmacy: Multiple medications with potential interactions and confusion about current regimen prescribed by different non  specialists -Schedule appointment with clinical pharmacist for medication reconciliation. Patient to bring all medication bottles.  Hypertension: Blood pressure slightly elevated today. Patient reports taking Carvedilol -Continue current regimen and monitor blood pressure.  Atrial Fibrillation: Patient is on Eliquis for anticoagulation and rate control with Coreg -Continue current regimen.  Type 2 Diabetes Mellitus/ Obesity: Well controlled. Patient requests refill of Ozempic. -Send prescription for Ozempic 1mg  once a week to [facility]. Inform patient of potential need for prior authorization.           Meds ordered this encounter  Medications   methylPREDNISolone sodium succinate (SOLU-MEDROL) 125 mg/2 mL injection 125 mg    Follow-up: Return in about 1 week (around 11/25/2022) for Medication reconciliation with Franky Macho, in 3 months, Medical conditions with PCP.       Hoy Register, MD, FAAFP. [facility] [place], [place] [phone]   11/18/2022, 6:45 PM

## 2022-11-18 NOTE — Progress Notes (Signed)
Refill on ozempic

## 2022-11-18 NOTE — Patient Instructions (Signed)
Diabetic Neuropathy Diabetic neuropathy refers to nerve damage that is caused by diabetes. Over time, people with diabetes can develop nerve damage throughout the body. There are several types of diabetic neuropathy: Peripheral neuropathy. This is the most common type of diabetic neuropathy. It damages the nerves that carry signals between the spinal cord and other parts of the body (peripheral nerves). This usually affects nerves in the feet, legs, hands, and arms. Autonomic neuropathy. This type causes damage to nerves that control involuntary functions (autonomic nerves). Involuntary functions are functions of the body that you do not control. They include heartbeat, body temperature, blood pressure, urination, digestion, sweating, sexual function, or response to changes in blood glucose. Focal neuropathy. This type of nerve damage affects one area of the body, such as an arm, a leg, or the face. The injury may involve one nerve or a small group of nerves. Focal neuropathy can be painful and unpredictable. It occurs most often in older adults with diabetes. This often develops suddenly, but usually improves over time and does not cause long-term problems. Proximal neuropathy. This type of nerve damage affects the nerves of the thighs, hips, buttocks, or legs. It causes severe pain, weakness, and muscle death (atrophy), usually in the thigh muscles. It is more common among older men and people who have type 2 diabetes. The length of recovery time may vary. What are the causes? Peripheral, autonomic, and focal neuropathies are caused by diabetes that is not well controlled with treatment. The cause of proximal neuropathy is not known, but it may be caused by inflammation related to uncontrolled blood glucose levels. What are the signs or symptoms? Peripheral neuropathy Peripheral neuropathy develops slowly over time. When the nerves of the feet and legs no longer work, you may experience: Burning,  stabbing, or aching pain in the legs or feet. Pain or cramping in the legs or feet. Loss of feeling (numbness) and inability to feel pressure or pain in the feet. This can lead to: Thick calluses or sores on areas of constant pressure. Ulcers. Reduced ability to feel temperature changes. Foot deformities. Muscle weakness. Loss of balance or coordination. Autonomic neuropathy The symptoms of autonomic neuropathy vary depending on which nerves are affected. Symptoms may include: Problems with digestion, such as: Nausea or vomiting. Poor appetite. Bloating. Diarrhea or constipation. Trouble swallowing. Losing weight without trying to. Problems with the heart, blood, and lungs, such as: Dizziness, especially when standing up. Fainting. Shortness of breath. Irregular heartbeat. Bladder problems, such as: Trouble starting or stopping urination. Leaking urine. Trouble emptying the bladder. Urinary tract infections (UTIs). Problems with other body functions, such as: Sweat. You may sweat too much or too little. Temperature. You might get hot easily. Or, you might feel cold more than usual. Sexual function. Men may not be able to get or maintain an erection. Women may have vaginal dryness and difficulty with arousal. Focal neuropathy Symptoms affect only one area of the body. Common symptoms include: Numbness. Tingling. Burning pain. Prickling feeling. Very sensitive skin. Weakness. Inability to move (paralysis). Muscle twitching. Muscles getting smaller (wasting). Poor coordination. Double or blurred vision. Proximal neuropathy Sudden, severe pain in the hip, thigh, or buttocks. Pain may spread from the back into the legs (sciatica). Pain and numbness in the arms and legs. Tingling. Loss of bladder control or bowel control. Weakness and wasting of thigh muscles. Difficulty getting up from a seated position. Abdominal swelling. Unexplained weight loss. How is this  diagnosed? Diagnosis varies depending on the type  of neuropathy your health care provider suspects. Peripheral neuropathy Your health care provider will do a neurologic exam. This exam checks your reflexes, how you move, and what you can feel. You may have other tests, such as: Blood tests. Tests of the fluid that surrounds the spinal cord (lumbar puncture). CT scan. MRI. Checking the nerves that control muscles (electromyogram, or EMG). Checking how quickly signals pass through your nerves (nerve conduction study). Checking a small piece of a nerve using a microscope (biopsy). Autonomic neuropathy You may have tests, such as: Tests to measure your blood pressure and heart rate. You may be secured to an exam table that moves you from a lying position to an upright position (table tilt test). Breathing tests to check your lungs. Tests to check how food moves through the digestive system (gastric emptying tests). Blood, sweat, or urine tests. Ultrasound of your bladder. Spinal fluid tests. Focal neuropathy This condition may be diagnosed with: A neurologic exam. CT scan. MRI. EMG. Nerve conduction study. Proximal neuropathy There is no test to diagnose this type of neuropathy. You may have tests to rule out other possible causes of this type of neuropathy. Tests may include: X-rays of your spine and lumbar region. Lumbar puncture. MRI. How is this treated? The goal of treatment is to keep nerve damage from getting worse. Treatment may include: Following your diabetes management plan. This will help keep your blood glucose level and your A1C level within your target range. This is the most important treatment. Using prescription pain medicine. Follow these instructions at home: Diabetes management Follow your diabetes management plan as told by your health care provider. Check your blood glucose levels. Keep your blood glucose in your target range. Have your A1C level checked at  least two times a year, or as often as told. Take over the counter and prescription medicines only as told by your health care provider. This includes insulin and diabetes medicine.  Lifestyle  Do not use any products that contain nicotine or tobacco, such as cigarettes, e-cigarettes, and chewing tobacco. If you need help quitting, ask your health care provider. Be physically active every day. Include strength training and balance exercises. Follow a healthy meal plan. Work with your health care provider to manage your blood pressure. General instructions Ask your health care provider if the medicine prescribed to you requires you to avoid driving or using machinery. Check your skin and feet every day for cuts, bruises, redness, blisters, or sores. Keep all follow-up visits. This is important. Contact a health care provider if: You have burning, stabbing, or aching pain in your legs or feet. You are unable to feel pressure or pain in your feet. You develop problems with digestion, such as: Nausea. Vomiting. Bloating. Constipation. Diarrhea. Abdominal pain. You have difficulty with urination, such as: Inability to control when you urinate (incontinence). Inability to completely empty the bladder (retention). You feel as if your heart is racing (palpitations). You feel dizzy, weak, or faint when you stand up. Get help right away if: You cannot urinate. You have sudden weakness or loss of coordination. You have trouble speaking. You have pain or pressure in your chest. You have an irregular heartbeat. You have sudden inability to move a part of your body. These symptoms may represent a serious problem that is an emergency. Do not wait to see if the symptoms will go away. Get medical help right away. Call your local emergency services (911 in the [place] Do not drive yourself to  the hospital. Summary Diabetic neuropathy is nerve damage that is caused by diabetes. It can cause numbness  and pain in the arms, legs, digestive tract, heart, and other body systems. This condition is treated by keeping your blood glucose level and your A1C level within your target range. This can help prevent neuropathy from getting worse. Check your skin and feet every day for cuts, bruises, redness, blisters, or sores. Do not use any products that contain nicotine or tobacco, such as cigarettes, e-cigarettes, and chewing tobacco. This information is not intended to replace advice given to you by your health care provider. Make sure you discuss any questions you have with your health care provider. Document Revised: 08/26/2019 Document Reviewed: 08/26/2019 Elsevier Patient Education  2024 [employer].

## 2022-11-19 ENCOUNTER — Other Ambulatory Visit: Payer: Self-pay

## 2022-11-19 ENCOUNTER — Ambulatory Visit (INDEPENDENT_AMBULATORY_CARE_PROVIDER_SITE_OTHER): Payer: 59 | Admitting: *Deleted

## 2022-11-19 ENCOUNTER — Other Ambulatory Visit: Payer: Self-pay | Admitting: Pharmacist

## 2022-11-19 VITALS — BP 155/97 | HR 74 | Temp 98.0°F | Resp 16 | Ht 66.0 in | Wt 311.0 lb

## 2022-11-19 DIAGNOSIS — J4489 Other specified chronic obstructive pulmonary disease: Secondary | ICD-10-CM

## 2022-11-19 DIAGNOSIS — J4552 Severe persistent asthma with status asthmaticus: Secondary | ICD-10-CM | POA: Diagnosis not present

## 2022-11-19 MED ORDER — TEZEPELUMAB-EKKO 210 MG/1.91ML ~~LOC~~ SOSY
210.0000 mg | PREFILLED_SYRINGE | Freq: Once | SUBCUTANEOUS | Status: AC
  Administered 2022-11-19: 210 mg via SUBCUTANEOUS
  Filled 2022-11-19: qty 1.91

## 2022-11-19 MED ORDER — ATORVASTATIN CALCIUM 20 MG PO TABS: 20.0000 mg | ORAL_TABLET | Freq: Every day | ORAL | 1 refills | Status: DC

## 2022-11-19 NOTE — Progress Notes (Signed)
Diagnosis: Asthma  Provider:  Chilton Greathouse MD  Procedure: Injection  Tezspire (Tezepelumab), Dose: 210 mg, Site: subcutaneous, Number of injections: 1  Post Care: Observation period completed  Discharge: Condition: Good, Destination: Home . AVS Declined  Performed by:  Forrest Moron, RN

## 2022-11-20 ENCOUNTER — Ambulatory Visit: Payer: Self-pay

## 2022-11-20 ENCOUNTER — Ambulatory Visit: Payer: 59 | Attending: Family Medicine

## 2022-11-20 VITALS — Ht 66.0 in | Wt 311.0 lb

## 2022-11-20 DIAGNOSIS — Z0000 Encounter for general adult medical examination without abnormal findings: Secondary | ICD-10-CM

## 2022-11-20 NOTE — Progress Notes (Signed)
Subjective:   April [name] is a 52 y.o. female who presents for Medicare Annual (Subsequent) preventive examination.  Visit Complete: Virtual  I connected with  [name] on 11/20/22 by a audio enabled telemedicine application and verified that I am speaking with the correct person using two identifiers.  Patient Location: Home  Provider Location: Home Office  I discussed the limitations of evaluation and management by telemedicine. The patient expressed understanding and agreed to proceed.  Per patient no change in vitals since last visit; unable to obtain new vitals due to this being a telehealth visit. Patient was unable to self-report vital signs via telehealth due to a lack of equipment at home.  Review of Systems     Cardiac Risk Factors include: obesity (BMI >30kg/m2);sedentary lifestyle     Objective:    Today's Vitals   11/20/22 2113  Weight: (!) 311 lb (141.1 kg)  Height: 5\' 6"  (1.676 m)   Body mass index is 50.2 kg/m.     11/20/2022    9:18 PM 07/19/2022   10:06 PM 07/19/2022    5:18 PM 07/01/2022    8:01 PM 05/19/2022    8:47 PM 05/02/2022    9:35 AM 04/28/2022    5:32 PM  Advanced Directives  Does Patient Have a Medical Advance Directive? No No No No No No No  Would patient like information on creating a medical advance directive? Yes (MAU/Ambulatory/Procedural Areas - Information given) No - Patient declined No - Guardian declined No - Patient declined No - Patient declined No - Patient declined No - Patient declined    Current Medications (verified) Outpatient Encounter Medications as of 11/20/2022  Medication Sig   acetaminophen (TYLENOL) 500 MG tablet Take 500-1,000 mg by mouth every 6 (six) hours as needed for mild pain or headache.   albuterol (VENTOLIN HFA) 108 (90 Base) MCG/ACT inhaler INHALE 2 PUFFS INTO THE LUNGS EVERY 6 HOURS AS NEEDED FOR WHEEZING OR SHORTNESS OF BREATH   ALEVE 220 MG tablet Take 220-440 mg by mouth 2 (two) times daily as needed  (for pain or headaches).   amLODipine (NORVASC) 10 MG tablet Take 1 tablet (10 mg total) by mouth daily.   apixaban (ELIQUIS) 5 MG TABS tablet Take 1 tablet (5 mg total) by mouth 2 (two) times daily.   atorvastatin (LIPITOR) 20 MG tablet Take 1 tablet (20 mg total) by mouth daily.   baclofen (LIORESAL) 20 MG tablet Take 20-40 mg by mouth 3 (three) times daily as needed for muscle spasms.   carvedilol (COREG) 6.25 MG tablet Take 1 tablet (6.25 mg total) by mouth 2 (two) times daily with a meal. TAKE 1 TABLET(6.25 MG) BY MOUTH TWICE DAILY WITH A MEAL Strength: 6.25 mg   clonazePAM (KLONOPIN) 0.5 MG tablet 1 twice daily as needed for anxiety (Patient taking differently: Take 0.5 mg by mouth in the morning and at bedtime.)   cloNIDine (CATAPRES) 0.2 MG tablet Take 1 tablet (0.2 mg total) by mouth 2 (two) times daily. TAKE 1 TABLET(0.2 MG) BY MOUTH TWICE DAILY Strength: 0.2 mg   EPINEPHrine 0.3 mg/0.3 mL IJ SOAJ injection Inject 0.3 mg into the muscle as needed for anaphylaxis.   famotidine (PEPCID) 40 MG tablet Take 40 mg by mouth daily in the afternoon.   FLUoxetine (PROZAC) 20 MG capsule Take 1 capsule (20 mg total) by mouth daily. Take 1 capsule daily. (Patient taking differently: Take 20 mg by mouth in the morning.)   fluticasone (FLONASE) 50  MCG/ACT nasal spray SHAKE LIQUID AND USE 1 SPRAY IN EACH NOSTRIL DAILY AS NEEDED FOR ALLERGIES OR RHINITIS (Patient taking differently: Place 1 spray into both nostrils daily.)   Fluticasone-Umeclidin-Vilant (TRELEGY ELLIPTA) 200-62.5-25 MCG/ACT AEPB Inhale 1 puff into the lungs daily.   furosemide (LASIX) 20 MG tablet TAKE 1 TABLET(20 MG) BY MOUTH DAILY   ipratropium-albuterol (DUONEB) 0.5-2.5 (3) MG/3ML SOLN 1 neb every 6 hours if needed for asthma (Patient taking differently: Take 3 mLs by nebulization every 4 (four) hours as needed (shortness of breath).)   levothyroxine (SYNTHROID) 50 MCG tablet Take 1 tablet (50 mcg total) by mouth daily. (Patient taking  differently: Take 50 mcg by mouth daily before breakfast.)   montelukast (SINGULAIR) 10 MG tablet Take 1 tablet (10 mg total) by mouth at bedtime.   naloxone (NARCAN) nasal spray 4 mg/0.1 mL Place 1 spray into the nose once as needed (opioid overdose).   omeprazole (PRILOSEC) 40 MG capsule Take 40 mg by mouth See admin instructions. Take 40 mg by mouth one to two times a day before meals   oxyCODONE-acetaminophen (PERCOCET) 10-325 MG tablet Take 1 tablet by mouth 5 (five) times daily as needed for pain. (Patient taking differently: Take 1 tablet by mouth 4 (four) times daily.)   phentermine 15 MG capsule Take 15 mg by mouth daily.   polyethylene glycol (MIRALAX / GLYCOLAX) 17 g packet Take 17 g by mouth daily.   potassium chloride SA (KLOR-CON M) 20 MEQ tablet Take 1 tablet (20 mEq total) by mouth daily.   Semaglutide, 1 MG/DOSE, (OZEMPIC, 1 MG/DOSE,) 4 MG/3ML SOPN Inject 1 mg into the skin every Monday.   solifenacin (VESICARE) 5 MG tablet Take 1 tablet (5 mg total) by mouth daily.   Tezepelumab-ekko (TEZSPIRE) 210 MG/1. SOAJ Inject 210 mg into the skin every 28 (twenty-eight) days.   topiramate (TOPAMAX) 200 MG tablet Take 200 mg by mouth at bedtime.   Vitamin D, Ergocalciferol, (DRISDOL) 1.25 MG (50000 UNIT) CAPS capsule Take 50,000 Units by mouth every Friday.   zolpidem (AMBIEN) 10 MG tablet TAKE 1 TABLET BY MOUTH AT BEDTIME AS NEEDED FOR SLEEP   [DISCONTINUED] albuterol (PROAIR HFA) 108 (90 Base) MCG/ACT inhaler Inhale 2 puffs into the lungs every 6 (six) hours as needed for wheezing or shortness of breath. (Patient taking differently: Inhale 2 puffs into the lungs See admin instructions. Inhale 2 puffs into the lungs every 2-4 hours as needed for shortness of breath or wheezing)   [DISCONTINUED] mometasone-formoterol (DULERA) 200-5 MCG/ACT AERO Inhale 2 puffs into the lungs 2 (two) times daily. Dx: Asthma   Facility-Administered Encounter Medications as of 11/20/2022  Medication    tezepelumab-ekko (TEZSPIRE) 210 MG/1. syringe 210 mg    Allergies (verified) Other and Contrast media [iodinated contrast media]   History: Past Medical History:  Diagnosis Date   Acanthosis nigricans    Anxiety    Arthritis    "knees" (04/28/2017)   Asthma    Followed by Dr. Maple Hudson (pulmonology); receives every other week omalizumab injections; has frequent exacerbations   Back pain    Chronic diastolic CHF (congestive heart failure) (HCC) 01/17/2017   COPD (chronic obstructive pulmonary disease) (HCC)    PFTs in 2002, FEV1/FVC 65, no post bronchodilater test done   Depression    GERD (gastroesophageal reflux disease)    Headache(784.0)    "q couple days" (04/28/2017)   Helicobacter pylori (H. pylori) infection    Hypertension, essential    Insomnia    Joint  pain    Lower extremity edema    Menorrhagia    Morbid obesity (HCC)    OSA on CPAP    Sleep study 2008 - mild OSA, not enough events to titrate CPAP; wears CPAP now/pt on 04/28/2017   Pneumonia X 1   Prediabetes    Rheumatoid arthritis (HCC)    Seasonal allergies    Shortness of breath    Tobacco user    Vitamin D deficiency    Past Surgical History:  Procedure Laterality Date   CARDIOVERSION N/A 05/30/2017   Procedure: CARDIOVERSION;  Surgeon: Thurmon Fair, MD;  Location: [facility];  Service: Cardiovascular;  Laterality: N/A;   REDUCTION MAMMAPLASTY Bilateral 09/2011   TUBAL LIGATION  1996   bilateral   Family History  Problem Relation Age of Onset   Hypertension Mother    Asthma Daughter    Cancer Paternal Aunt    Asthma Maternal Grandmother    Social History   Socioeconomic History   Marital status: Single    Spouse name: Not on file   Number of children: 1   Years of education: Not on file   Highest education level: Not on file  Occupational History   Occupation: stay at home  Tobacco Use   Smoking status: Former    Current packs/day: 0.00    Average packs/day: 0.5 packs/day for 26.0  years (13.0 ttl pk-yrs)    Types: Cigarettes    Start date: 09/11/1988    Quit date: 09/12/2014    Years since quitting: 8.1   Smokeless tobacco: Never   Tobacco comments:    Has vaped before, caught in the hospital  Vaping Use   Vaping status: Never Used  Substance and Sexual Activity   Alcohol use: No   Drug use: No   Sexual activity: Never  Other Topics Concern   Not on file  Social History Narrative   Daughters are grown, not married, works as a Lawyer.   Social Determinants of Health   Financial Resource Strain: Medium Risk (11/20/2022)   Overall Financial Resource Strain (CARDIA)    Difficulty of Paying Living Expenses: Somewhat hard  Food Insecurity: No Food Insecurity (11/20/2022)   Hunger Vital Sign    Worried About Running Out of Food in the Last Year: Never true    Ran Out of Food in the Last Year: Never true  Transportation Needs: No Transportation Needs (11/20/2022)   PRAPARE - Administrator, Civil Service (Medical): No    Lack of Transportation (Non-Medical): No  Physical Activity: Insufficiently Active (11/20/2022)   Exercise Vital Sign    Days of Exercise per Week: 3 days    Minutes of Exercise per Session: 30 min  Stress: Stress Concern Present (11/20/2022)   [employer] of Occupational Health - Occupational Stress Questionnaire    Feeling of Stress : To some extent  Social Connections: Socially Isolated (11/20/2022)   Social Connection and Isolation Panel [NHANES]    Frequency of Communication with Friends and Family: More than three times a week    Frequency of Social Gatherings with Friends and Family: Three times a week    Attends Religious Services: Never    Active Member of Clubs or Organizations: No    Attends Banker Meetings: Never    Marital Status: Never married    Tobacco Counseling Counseling given: Not Answered Tobacco comments: Has vaped before, caught in the hospital   Clinical Intake:  Pre-visit preparation  completed: Yes  Pain : No/denies pain     Diabetes: Yes CBG done?: No Did pt. bring in CBG monitor from home?: No  How often do you need to have someone help you when you read instructions, pamphlets, or other written materials from your doctor or pharmacy?: 1 - Never  Interpreter Needed?: No  Information entered by :: Kandis Fantasia LPN   Activities of Daily Living    11/20/2022    9:18 PM 07/19/2022   10:09 PM  In your present state of health, do you have any difficulty performing the following activities:  Hearing? 0   Vision? 0   Difficulty concentrating or making decisions? 0   Walking or climbing stairs? 0   Dressing or bathing? 0   Doing errands, shopping? 0 0  Preparing Food and eating ? N   Using the Toilet? N   In the past six months, have you accidently leaked urine? N   Do you have problems with loss of bowel control? N   Managing your Medications? N   Managing your Finances? N   Housekeeping or managing your Housekeeping? N     Patient Care Team: Hoy Register, MD as PCP - General (Family Medicine) Croitoru, Rachelle Hora, MD as PCP - Cardiology (Cardiology)  Indicate any recent Medical Services you may have received from other than [facility] providers in the past year (date may be approximate).     Assessment:   This is a routine wellness examination for Makeyla.  Hearing/Vision screen Hearing Screening - Comments:: Denies hearing difficulties   Vision Screening - Comments:: No vision problems; will schedule routine eye exam soon    Dietary issues and exercise activities discussed:     Goals Addressed             This Visit's Progress    Increase physical activity        Depression Screen    11/20/2022    9:16 PM 11/18/2022    2:04 PM 08/21/2022   10:07 AM 07/09/2022   10:11 AM 05/23/2022    3:44 PM 03/11/2022    2:28 PM 02/05/2022    2:09 PM  PHQ 2/9 Scores  PHQ - 2 Score 2 2 4 5  6    PHQ- 9 Score 5 5 14 13  23       Information is  confidential and restricted. Go to Review Flowsheets to unlock data.    Fall Risk    11/20/2022    9:17 PM 11/18/2022    2:04 PM 08/21/2022   10:07 AM 07/09/2022   10:07 AM 03/11/2022    2:28 PM  Fall Risk   Falls in the past year? 0 0 0 0 0  Number falls in past yr: 0 0 0 0 0  Injury with Fall? 0 0 0 0 0  Risk for fall due to : No Fall Risks No Fall Risks No Fall Risks  No Fall Risks  Follow up Falls prevention discussed;Education provided;Falls evaluation completed        MEDICARE RISK AT HOME:  Medicare Risk at Home - 11/20/22 2117     Any stairs in or around the home? No    If so, are there any without handrails? No    Home free of loose throw rugs in walkways, pet beds, electrical cords, etc? Yes    Adequate lighting in your home to reduce risk of falls? Yes    Life alert? No    Use of a cane, walker or w/c? No  Grab bars in the bathroom? No    Shower chair or bench in shower? No    Elevated toilet seat or a handicapped toilet? No             TIMED UP AND GO:  Was the test performed?  No    Cognitive Function:        11/20/2022    9:18 PM 01/30/2022    3:24 PM  6CIT Screen  What Year? 0 points 0 points  What month? 0 points 0 points  What time? 0 points 0 points  Count back from 20 0 points 0 points  Months in reverse 0 points 0 points  Repeat phrase 0 points   Total Score 0 points     Immunizations Immunization History  Administered Date(s) Administered   Influenza Split 01/08/2012   Influenza Whole 03/03/2009, 12/27/2009, 12/19/2010   Influenza, High Dose Seasonal PF 01/12/2019   Influenza,inj,Quad PF,6+ Mos 12/29/2012, 12/31/2013, 03/07/2015, 01/23/2016, 01/06/2017, 01/11/2020, 02/07/2021, 01/28/2022   PFIZER(Purple Top)SARS-COV-2 Vaccination 03/29/2020, 04/29/2020   PNEUMOCOCCAL CONJUGATE-20 02/20/2021   PPD Test 09/25/2011, 07/17/2012, 06/25/2013, 05/21/2016, 11/11/2017   Pneumococcal Polysaccharide-23 03/17/2009, 07/06/2016, 10/20/2016    Tdap 12/19/2010    TDAP status: Due, Education has been provided regarding the importance of this vaccine. Advised may receive this vaccine at local pharmacy or Health Dept. Aware to provide a copy of the vaccination record if obtained from local pharmacy or Health Dept. Verbalized acceptance and understanding.  Pneumococcal vaccine status: Up to date  Covid-19 vaccine status: Information provided on how to obtain vaccines.   Qualifies for Shingles Vaccine? Yes   Zostavax completed No   Shingrix Completed?: No.    Education has been provided regarding the importance of this vaccine. Patient has been advised to call insurance company to determine out of pocket expense if they have not yet received this vaccine. Advised may also receive vaccine at local pharmacy or Health Dept. Verbalized acceptance and understanding.  Screening Tests Health Maintenance  Topic Date Due   Diabetic kidney evaluation - Urine ACR  Never done   Zoster Vaccines- Shingrix (1 of 2) Never done   OPHTHALMOLOGY EXAM  07/08/2019   COVID-19 Vaccine (3 - Pfizer risk series) 05/27/2020   INFLUENZA VACCINE  11/28/2022   FOOT EXAM  11/29/2022   PAP SMEAR-Modifier  03/31/2023   HEMOGLOBIN A1C  05/21/2023   Diabetic kidney evaluation - eGFR measurement  07/20/2023   Medicare Annual Wellness (AWV)  11/20/2023   MAMMOGRAM  11/05/2024   Colonoscopy  09/19/2029   Hepatitis C Screening  Completed   HIV Screening  Completed   HPV VACCINES  Aged Out   DTaP/Tdap/Td  Discontinued    Health Maintenance  Health Maintenance Due  Topic Date Due   Diabetic kidney evaluation - Urine ACR  Never done   Zoster Vaccines- Shingrix (1 of 2) Never done   OPHTHALMOLOGY EXAM  07/08/2019   COVID-19 Vaccine (3 - Pfizer risk series) 05/27/2020    Colorectal cancer screening: Type of screening: Colonoscopy. Completed 09/20/19. Repeat every 10 years  Mammogram status: Completed 11/06/22. Repeat every year  Lung Cancer Screening: (Low  Dose CT Chest recommended if Age 53-80 years, 20 pack-year currently smoking OR have quit w/in 15years.) does not qualify.   Lung Cancer Screening Referral: n/a  Additional Screening:  Hepatitis C Screening: does qualify; Completed 08/23/15  Vision Screening: Recommended annual ophthalmology exams for early detection of glaucoma and other disorders of the eye. Is the patient up to  date with their annual eye exam?  No  Who is the provider or what is the name of the office in which the patient attends annual eye exams? none If pt is not established with a provider, would they like to be referred to a provider to establish care? No .   Dental Screening: Recommended annual dental exams for proper oral hygiene  Diabetic Foot Exam: Diabetic Foot Exam: Overdue, Pt has been advised about the importance in completing this exam. Pt is scheduled for diabetic foot exam on at next office visit.  Community Resource Referral / Chronic Care Management: CRR required this visit?  No   CCM required this visit?  No     Plan:     I have personally reviewed and noted the following in the patient's chart:   Medical and social history Use of alcohol, tobacco or illicit drugs  Current medications and supplements including opioid prescriptions. Patient is currently taking opioid prescriptions. Information provided to patient regarding non-opioid alternatives. Patient advised to discuss non-opioid treatment plan with their provider. Functional ability and status Nutritional status Physical activity Advanced directives List of other physicians Hospitalizations, surgeries, and ER visits in previous 12 months Vitals Screenings to include cognitive, depression, and falls Referrals and appointments  In addition, I have reviewed and discussed with patient certain preventive protocols, quality metrics, and best practice recommendations. A written personalized care plan for preventive services as well as  general preventive health recommendations were provided to patient.     Kandis Fantasia [place], [place]   1/61/0960   After Visit Summary: (Mail) Due to this being a telephonic visit, the after visit summary with patients personalized plan was offered to patient via mail   Nurse Notes: No concerns at this time

## 2022-11-20 NOTE — Patient Instructions (Signed)
April [name] , Thank you for taking time to come for your Medicare Wellness Visit. I appreciate your ongoing commitment to your health goals. Please review the following plan we discussed and let me know if I can assist you in the future.   Referrals/Orders/Follow-Ups/Clinician Recommendations: schedule routine eye exam soon   This is a list of the screening recommended for you and due dates:  Health Maintenance  Topic Date Due   Yearly kidney health urinalysis for diabetes  Never done   Zoster (Shingles) Vaccine (1 of 2) Never done   Eye exam for diabetics  07/08/2019   COVID-19 Vaccine (3 - Pfizer risk series) 05/27/2020   Flu Shot  11/28/2022   Complete foot exam   11/29/2022   Pap Smear  03/31/2023   Hemoglobin A1C  05/21/2023   Yearly kidney function blood test for diabetes  07/20/2023   Medicare Annual Wellness Visit  11/20/2023   Mammogram  11/05/2024   Colon Cancer Screening  09/19/2029   Hepatitis C Screening  Completed   HIV Screening  Completed   HPV Vaccine  Aged Out   DTaP/Tdap/Td vaccine  Discontinued    Advanced directives: (ACP Link)Information on Advanced Care Planning can be found at [facility] [place] Advance Health Care Directives Advance Health Care Directives ([email or web address]   Next Medicare Annual Wellness Visit scheduled for next year: Yes  Preventive Care 40-64 Years, Female Preventive care refers to lifestyle choices and visits with your health care provider that can promote health and wellness. What does preventive care include? A yearly physical exam. This is also called an annual well check. Dental exams once or twice a year. Routine eye exams. Ask your health care provider how often you should have your eyes checked. Personal lifestyle choices, including: Daily care of your teeth and gums. Regular physical activity. Eating a healthy diet. Avoiding tobacco and drug use. Limiting alcohol use. Practicing safe sex. Taking low-dose aspirin  daily starting at age 50. Taking vitamin and mineral supplements as recommended by your health care provider. What happens during an annual well check? The services and screenings done by your health care provider during your annual well check will depend on your age, overall health, lifestyle risk factors, and family history of disease. Counseling  Your health care provider may ask you questions about your: Alcohol use. Tobacco use. Drug use. Emotional well-being. Home and relationship well-being. Sexual activity. Eating habits. Work and work Astronomer. Method of birth control. Menstrual cycle. Pregnancy history. Screening  You may have the following tests or measurements: Height, weight, and BMI. Blood pressure. Lipid and cholesterol levels. These may be checked every 5 years, or more frequently if you are over 60 years old. Skin check. Lung cancer screening. You may have this screening every year starting at age 38 if you have a 30-pack-year history of smoking and currently smoke or have quit within the past 15 years. Fecal occult blood test (FOBT) of the stool. You may have this test every year starting at age 44. Flexible sigmoidoscopy or colonoscopy. You may have a sigmoidoscopy every 5 years or a colonoscopy every 10 years starting at age 13. Hepatitis C blood test. Hepatitis B blood test. Sexually transmitted disease (STD) testing. Diabetes screening. This is done by checking your blood sugar (glucose) after you have not eaten for a while (fasting). You may have this done every 1-3 years. Mammogram. This may be done every 1-2 years. Talk to your health care provider about when you  should start having regular mammograms. This may depend on whether you have a family history of breast cancer. BRCA-related cancer screening. This may be done if you have a family history of breast, ovarian, tubal, or peritoneal cancers. Pelvic exam and Pap test. This may be done every 3 years  starting at age 62. Starting at age 82, this may be done every 5 years if you have a Pap test in combination with an HPV test. Bone density scan. This is done to screen for osteoporosis. You may have this scan if you are at high risk for osteoporosis. Discuss your test results, treatment options, and if necessary, the need for more tests with your health care provider. Vaccines  Your health care provider may recommend certain vaccines, such as: Influenza vaccine. This is recommended every year. Tetanus, diphtheria, and acellular pertussis (Tdap, Td) vaccine. You may need a Td booster every 10 years. Zoster vaccine. You may need this after age 36. Pneumococcal 13-valent conjugate (PCV13) vaccine. You may need this if you have certain conditions and were not previously vaccinated. Pneumococcal polysaccharide (PPSV23) vaccine. You may need one or two doses if you smoke cigarettes or if you have certain conditions. Talk to your health care provider about which screenings and vaccines you need and how often you need them. This information is not intended to replace advice given to you by your health care provider. Make sure you discuss any questions you have with your health care provider. Document Released: 05/12/2015 Document Revised: 01/03/2016 Document Reviewed: 02/14/2015 Elsevier Interactive Patient Education  2017 [employer].    Fall Prevention in the Home Falls can cause injuries. They can happen to people of all ages. There are many things you can do to make your home safe and to help prevent falls. What can I do on the outside of my home? Regularly fix the edges of walkways and driveways and fix any cracks. Remove anything that might make you trip as you walk through a door, such as a raised step or threshold. Trim any bushes or trees on the path to your home. Use bright outdoor lighting. Clear any walking paths of anything that might make someone trip, such as rocks or  tools. Regularly check to see if handrails are loose or broken. Make sure that both sides of any steps have handrails. Any raised decks and porches should have guardrails on the edges. Have any leaves, snow, or ice cleared regularly. Use sand or salt on walking paths during winter. Clean up any spills in your garage right away. This includes oil or grease spills. What can I do in the bathroom? Use night lights. Install grab bars by the toilet and in the tub and shower. Do not use towel bars as grab bars. Use non-skid mats or decals in the tub or shower. If you need to sit down in the shower, use a plastic, non-slip stool. Keep the floor dry. Clean up any water that spills on the floor as soon as it happens. Remove soap buildup in the tub or shower regularly. Attach bath mats securely with double-sided non-slip rug tape. Do not have throw rugs and other things on the floor that can make you trip. What can I do in the bedroom? Use night lights. Make sure that you have a light by your bed that is easy to reach. Do not use any sheets or blankets that are too big for your bed. They should not hang down onto the floor. Have a firm  chair that has side arms. You can use this for support while you get dressed. Do not have throw rugs and other things on the floor that can make you trip. What can I do in the kitchen? Clean up any spills right away. Avoid walking on wet floors. Keep items that you use a lot in easy-to-reach places. If you need to reach something above you, use a strong step stool that has a grab bar. Keep electrical cords out of the way. Do not use floor polish or wax that makes floors slippery. If you must use wax, use non-skid floor wax. Do not have throw rugs and other things on the floor that can make you trip. What can I do with my stairs? Do not leave any items on the stairs. Make sure that there are handrails on both sides of the stairs and use them. Fix handrails that are  broken or loose. Make sure that handrails are as long as the stairways. Check any carpeting to make sure that it is firmly attached to the stairs. Fix any carpet that is loose or worn. Avoid having throw rugs at the top or bottom of the stairs. If you do have throw rugs, attach them to the floor with carpet tape. Make sure that you have a light switch at the top of the stairs and the bottom of the stairs. If you do not have them, ask someone to add them for you. What else can I do to help prevent falls? Wear shoes that: Do not have high heels. Have rubber bottoms. Are comfortable and fit you well. Are closed at the toe. Do not wear sandals. If you use a stepladder: Make sure that it is fully opened. Do not climb a closed stepladder. Make sure that both sides of the stepladder are locked into place. Ask someone to hold it for you, if possible. Clearly mark and make sure that you can see: Any grab bars or handrails. First and last steps. Where the edge of each step is. Use tools that help you move around (mobility aids) if they are needed. These include: Canes. Walkers. Scooters. Crutches. Turn on the lights when you go into a dark area. Replace any light bulbs as soon as they burn out. Set up your furniture so you have a clear path. Avoid moving your furniture around. If any of your floors are uneven, fix them. If there are any pets around you, be aware of where they are. Review your medicines with your doctor. Some medicines can make you feel dizzy. This can increase your chance of falling. Ask your doctor what other things that you can do to help prevent falls. This information is not intended to replace advice given to you by your health care provider. Make sure you discuss any questions you have with your health care provider. Document Released: 02/09/2009 Document Revised: 09/21/2015 Document Reviewed: 05/20/2014 Elsevier Interactive Patient Education  2017 [employer].

## 2022-11-20 NOTE — Telephone Encounter (Signed)
Message from Lennox Pippins sent at 11/20/2022  2:18 PM EDT  Summary: requesting a call back for hot flashes   Patient stated she is having hot flashes and forgot to bring this issue up to PCP at her visit on Monday and wanted to discuss this hot flash symptom, no other symptoms per patient.  Patients callback #: [phone]         Chief Complaint: hot flashes Symptoms: depressed, isolating herself, flat tone to voice Frequency: 2 weeks pt stated sx have worsened Pertinent Negatives: Patient denies suicidal ideation or homicidal ideation Disposition: [] ED /[] Urgent Care (no appt availability in office) / [] Appointment(In office/virtual)/ []  [facility]/ [] Home Care/ [] Refused Recommended Disposition /[] [facility]/ [x]  Follow-up with PCP Additional Notes: LMP ended last Tuesday. Pt stated she was on Clonidine but sx back and worse.   Reason for Disposition  Normal menopause symptoms (e.g., hot flashes, irregular periods, vaginal dryness)  Answer Assessment - Initial Assessment Questions 1. MAIN SYMPTOM: "What is your main symptom?" "How bad is it?"  (e.g., difficulty concentrating, hot flashes, fatigue, irregular periods, vaginal dryness)     Hot flashes  2. ONSET: "When did the hot flashes begin?" (e.g., hours, days or weeks ago)     2 weeks  3. MENOPAUSE: "When was your last menstrual period?"    Last week last Tuesday  4. MEDICINES: "Are you taking any hormone prescription or over-the-counter medicines?" (e.g., estrogen; estrogen/progesterone, herbal supplements, medicines for mood, new medicines, Niacin)     No  5. OTHER SYMPTOMS: "Do you have any other symptoms?" (e.g., insomnia, mood swings)     Headache, stomach cramps, "going crazy" interrupting life, fatigue  6. CALLER'S COPING: "How are you doing?" (e.g., anxiety, depression, emotional state with menopause symptoms)     Depression, isolation, no suicidal plans ideations 7. TREATMENTS AND RESPONSE: "What  have you done so far to try to make this better?"     Clonidine it worked but not working anymore.  8. PREGNANCY: "Is there any chance you are pregnant?" "When was your last menstrual period?"     Last Tuesday last week.  Protocols used: Menopause Symptoms and Questions-A-AH

## 2022-11-21 NOTE — Telephone Encounter (Signed)
Call placed to patient unable to reach message left on VM.   

## 2022-11-28 ENCOUNTER — Ambulatory Visit: Payer: 59 | Attending: Family Medicine | Admitting: Pharmacist

## 2022-11-28 DIAGNOSIS — Z7189 Other specified counseling: Secondary | ICD-10-CM

## 2022-11-28 MED ORDER — MONTELUKAST SODIUM 10 MG PO TABS: 10.0000 mg | ORAL_TABLET | Freq: Every day | ORAL | 1 refills | Status: DC

## 2022-11-28 NOTE — Progress Notes (Signed)
11/28/2022 Name: April [name] MRN: [id number] DOB: 1970/08/18  No chief complaint on file.   April [name] is a 52 y.o. year old female who was referred for medication management by their primary care provider, Hoy Register, MD. They presented for a face to face visit today.   They were referred to the pharmacist by their PCP for assistance in managing complex medication management    Subjective:  Care Team: Primary Care Provider: Hoy Register, MD ; Next Scheduled Visit: 12/02/2022  Medication Access/Adherence  Current Pharmacy:  [id number] - [place], [place] - [address] AT [facility] OF [address] & [address] [facility] [address] Phone: [phone] Fax: [phone]   Patient reports affordability concerns with their medications: No  Patient reports access/transportation concerns to their pharmacy: No  Patient reports adherence concerns with their medications:  No     Medication Management:  Current adherence strategy: fills at [facility] on [address]  Patient reports Good adherence to medications  Patient reports the following barriers to adherence: none reported   Objective:  Lab Results  Component Value Date   HGBA1C 5.4 11/18/2022    Lab Results  Component Value Date   CREATININE 0.85 07/20/2022   BUN 13 07/20/2022   NA 134 (L) 07/20/2022   K 4.0 07/20/2022   CL 103 07/20/2022   CO2 21 (L) 07/20/2022    Lab Results  Component Value Date   CHOL 192 10/16/2021   HDL 99 10/16/2021   LDLCALC 83 10/16/2021   TRIG 51 10/16/2021   CHOLHDL 1.9 10/16/2021    Medications Reviewed Today     Reviewed by Drucilla Chalet, RPH-CPP (Pharmacist) on 11/28/22 at 1736  Med List Status: <None>   Medication Order Taking? Sig Documenting Provider Last Dose Status Informant  albuterol (VENTOLIN HFA) 108 (90 Base) MCG/ACT inhaler [id number] Yes INHALE 2 PUFFS INTO THE LUNGS EVERY 6 HOURS AS NEEDED FOR WHEEZING  OR SHORTNESS OF BREATH Young, Clinton D, MD Taking Active   amLODipine (NORVASC) 10 MG tablet [id number] Yes Take 1 tablet (10 mg total) by mouth daily. Hoy Register, MD Taking Active   apixaban (ELIQUIS) 5 MG TABS tablet [id number] Yes Take 1 tablet (5 mg total) by mouth 2 (two) times daily. Hoy Register, MD Taking Active   atorvastatin (LIPITOR) 20 MG tablet [id number] Yes Take 1 tablet (20 mg total) by mouth daily. Hoy Register, MD Taking Active   carvedilol (COREG) 6.25 MG tablet [id number] Yes Take 1 tablet (6.25 mg total) by mouth 2 (two) times daily with a meal. TAKE 1 TABLET(6.25 MG) BY MOUTH TWICE DAILY WITH A MEAL Strength: 6.25 mg Hoy Register, MD Taking Active   cloNIDine (CATAPRES) 0.2 MG tablet [id number] Yes Take 1 tablet (0.2 mg total) by mouth 2 (two) times daily. TAKE 1 TABLET(0.2 MG) BY MOUTH TWICE DAILY Strength: 0.2 mg Hoy Register, MD Taking Active            Med Note Zenaida Niece Allyson Sabal   Thu Nov 28, 2022  5:33 PM) Not working to control hot flashes  EPINEPHrine 0.3 mg/0.3 mL IJ SOAJ injection [id number] Yes Inject 0.3 mg into the muscle as needed for anaphylaxis. [name], MD Taking Active Self           Med Note Zenaida Niece [name] Nov 28, 2022  5:33 PM) Has for Emergent prn use   famotidine (PEPCID) 40 MG tablet  [id number] Yes Take 40 mg by mouth daily in the afternoon. [provider] Taking Active Self           Med Note Zenaida Niece [name] Nov 28, 2022  5:33 PM)    fluticasone (FLONASE) 50 MCG/ACT nasal spray [id number] Yes SHAKE LIQUID AND USE 1 SPRAY IN EACH NOSTRIL DAILY AS NEEDED FOR ALLERGIES OR RHINITIS  Patient taking differently: Place 1 spray into both nostrils daily.   Hoy Register, MD Taking Active Self  Fluticasone-Umeclidin-Vilant Harrel Carina ELLIPTA) 200-62.5-25 MCG/ACT AEPB [id number] Yes Inhale 1 puff into the lungs daily. Glenford Bayley, NP Taking Active Self  furosemide (LASIX) 20 MG tablet [id number]  Yes TAKE 1 TABLET(20 MG) BY MOUTH DAILY Hoy Register, MD Taking Active   ipratropium-albuterol (DUONEB) 0.5-2.5 (3) MG/3ML SOLN [id number] Yes 1 neb every 6 hours if needed for asthma  Patient taking differently: Take 3 mLs by nebulization every 4 (four) hours as needed (shortness of breath).   [name], MD Taking Active Self           Med Note Zenaida Niece [name] Nov 28, 2022  5:34 PM) PRN  levothyroxine (SYNTHROID) 50 MCG tablet [id number] Yes Take 1 tablet (50 mcg total) by mouth daily.  Patient taking differently: Take 50 mcg by mouth daily before breakfast.   Hoy Register, MD Taking Active Self    Discontinued 08/12/19 1434 (Reorder)   montelukast (SINGULAIR) 10 MG tablet [id number] Yes Take 1 tablet (10 mg total) by mouth at bedtime. Meredeth Ide, MD Taking Active Self  naloxone [facility]) nasal spray 4 mg/0.1 mL [id number] Yes Place 1 spray into the nose once as needed (opioid overdose). [provider] Taking Active Self           Med Note Zenaida Niece Allyson Sabal   Thu Nov 28, 2022  5:35 PM) Has on hand for prn use  oxyCODONE-acetaminophen (PERCOCET) 10-325 MG tablet [id number] Yes Take 1 tablet by mouth 5 (five) times daily as needed for pain.  Patient taking differently: Take 1 tablet by mouth 4 (four) times daily.    Taking Active Self           Med Note (VAN AUSDALL,  L   Thu Nov 28, 2022  5:35 PM) prn  potassium chloride SA (KLOR-CON M) 20 MEQ tablet [id number] Yes Take 1 tablet (20 mEq total) by mouth daily. Hoy Register, MD Taking Active   Semaglutide, 1 MG/DOSE, (OZEMPIC, 1 MG/DOSE,) 4 MG/3ML SOPN [id number] No Inject 1 mg into the skin every Monday.  Patient not taking: Reported on 11/28/2022   [provider] Not Taking Active Self           Med Note Drucilla Chalet   Thu Nov 28, 2022  5:35 PM) Requests PA  solifenacin (VESICARE) 5 MG tablet [id number] Yes Take 1 tablet (5 mg total) by mouth daily. Hoy Register, MD Taking  Active   Tezepelumab-ekko (TEZSPIRE) 210 MG/1. SOAJ [id number] Yes Inject 210 mg into the skin every 28 (twenty-eight) days. [name], MD Taking Active   topiramate (TOPAMAX) 200 MG tablet [id number] Yes Take 200 mg by mouth at bedtime. [provider] Taking Active Self           Med Note (CRUTHIS, CHLOE C   Fri Apr 05, 2022  7:31 AM)    Vitamin D, Ergocalciferol, (DRISDOL) 1.25 MG (50000 UNIT) CAPS capsule [id number] Yes Take 50,000 Units by  mouth every Friday. [provider] Taking Active Self  zolpidem (AMBIEN) 10 MG tablet [id number] Yes TAKE 1 TABLET BY MOUTH AT BEDTIME AS NEEDED FOR SLEEP [name], MD Taking Active            Med Note Zenaida Niece [name] Nov 28, 2022  5:36 PM) prn              Assessment/Plan:   Medication Management: - Currently strategy sufficient to maintain appropriate adherence to prescribed medication regimen - Suggested use of weekly pill box to organize medications - Created list of medication, indication, and administration time. Provided to patient    Follow Up Plan: w/ PCP  Butch Penny, PharmD, BCACP, CPP Clinical Pharmacist [facility] [phone]

## 2022-12-02 ENCOUNTER — Encounter: Payer: Self-pay | Admitting: Family Medicine

## 2022-12-02 ENCOUNTER — Ambulatory Visit: Payer: 59 | Attending: Family Medicine | Admitting: Family Medicine

## 2022-12-02 VITALS — BP 116/78 | HR 60 | Ht 66.0 in | Wt 304.0 lb

## 2022-12-02 DIAGNOSIS — Z7985 Long-term (current) use of injectable non-insulin antidiabetic drugs: Secondary | ICD-10-CM

## 2022-12-02 DIAGNOSIS — E1169 Type 2 diabetes mellitus with other specified complication: Secondary | ICD-10-CM

## 2022-12-02 DIAGNOSIS — N951 Menopausal and female climacteric states: Secondary | ICD-10-CM

## 2022-12-02 DIAGNOSIS — E1149 Type 2 diabetes mellitus with other diabetic neurological complication: Secondary | ICD-10-CM | POA: Diagnosis not present

## 2022-12-02 DIAGNOSIS — J4541 Moderate persistent asthma with (acute) exacerbation: Secondary | ICD-10-CM

## 2022-12-02 DIAGNOSIS — Z6841 Body Mass Index (BMI) 40.0 and over, adult: Secondary | ICD-10-CM

## 2022-12-02 DIAGNOSIS — Z7984 Long term (current) use of oral hypoglycemic drugs: Secondary | ICD-10-CM

## 2022-12-02 MED ORDER — OZEMPIC (1 MG/DOSE) 4 MG/3ML ~~LOC~~ SOPN: 1.0000 mg | PEN_INJECTOR | SUBCUTANEOUS | 3 refills | Status: DC

## 2022-12-02 MED ORDER — VEOZAH 45 MG PO TABS: 45.0000 mg | ORAL_TABLET | Freq: Every day | ORAL | 1 refills | Status: DC

## 2022-12-02 NOTE — Progress Notes (Signed)
Subjective:  Patient ID: April [name], female    DOB: 06-13-1970  Age: 52 y.o. MRN: [id number]  CC: Hot Flashes (Referral for weight loss surgery)   HPI April [name] is a 52 y.o. year old female with a history of morbid obesity, major depressive disorder, hypertension, COPD/ Asthma, paroxysmal atrial fibrillation obstructive sleep apnea (not compliant with CPAP) with multiple hospitalizations for asthma exacerbation/COPD , steroid-induced diabetes  (previously on metformin) .   Interval History: Discussed the use of AI scribe software for clinical note transcription with the patient, who gave verbal consent to proceed.  She reports that her hot flashes have been poorly controlled with clonidine. She experiences hot flashes predominantly at night, with an average of five nights out of seven affected. Previous trials of gabapentin for hot flash management were unsuccessful.  In addition to hot flashes, the patient has been managing diabetes with diet and was prescribed Ozempic by [facility] for weight loss, which she has been without for approximately a month. She expresses interest in switching from the injectable form of the medication to the oral form, Rybelsus, due to nausea experienced with the injectable form.  The patient also reports neuropathic pain in her feet, described as a tingling, numb sensation that is present both at rest and upon standing. Current management with pregabalin (50mg ) and gabapentin and Cymbalta which she has used in the past has not been effective in alleviating this pain.  Lastly, the patient expresses a desire for weight loss surgery and requests a referral to a bariatric surgeon.        Past Medical History:  Diagnosis Date   Acanthosis nigricans    Anxiety    Arthritis    "knees" (04/28/2017)   Asthma    Followed by Dr. Maple Hudson (pulmonology); receives every other week omalizumab injections; has frequent exacerbations   Back pain    Chronic diastolic CHF  (congestive heart failure) (HCC) 01/17/2017   COPD (chronic obstructive pulmonary disease) (HCC)    PFTs in 2002, FEV1/FVC 65, no post bronchodilater test done   Depression    GERD (gastroesophageal reflux disease)    Headache(784.0)    "q couple days" (04/28/2017)   Helicobacter pylori (H. pylori) infection    Hypertension, essential    Insomnia    Joint pain    Lower extremity edema    Menorrhagia    Morbid obesity (HCC)    OSA on CPAP    Sleep study 2008 - mild OSA, not enough events to titrate CPAP; wears CPAP now/pt on 04/28/2017   Pneumonia X 1   Prediabetes    Rheumatoid arthritis (HCC)    Seasonal allergies    Shortness of breath    Tobacco user    Vitamin D deficiency     Past Surgical History:  Procedure Laterality Date   CARDIOVERSION N/A 05/30/2017   Procedure: CARDIOVERSION;  Surgeon: Thurmon Fair, MD;  Location: [facility];  Service: Cardiovascular;  Laterality: N/A;   REDUCTION MAMMAPLASTY Bilateral 09/2011   TUBAL LIGATION  1996   bilateral    Family History  Problem Relation Age of Onset   Hypertension Mother    Asthma Daughter    Cancer Paternal Aunt    Asthma Maternal Grandmother     Social History   Socioeconomic History   Marital status: Single    Spouse name: Not on file   Number of children: 1   Years of education: Not on file   Highest education level: Not on  file  Occupational History   Occupation: stay at home  Tobacco Use   Smoking status: Former    Current packs/day: 0.00    Average packs/day: 0.5 packs/day for 26.0 years (13.0 ttl pk-yrs)    Types: Cigarettes    Start date: 09/11/1988    Quit date: 09/12/2014    Years since quitting: 8.2   Smokeless tobacco: Never   Tobacco comments:    Has vaped before, caught in the hospital  Vaping Use   Vaping status: Never Used  Substance and Sexual Activity   Alcohol use: No   Drug use: No   Sexual activity: Never  Other Topics Concern   Not on file  Social History Narrative    Daughters are grown, not married, works as a Lawyer.   Social Determinants of Health   Financial Resource Strain: Medium Risk (11/20/2022)   Overall Financial Resource Strain (CARDIA)    Difficulty of Paying Living Expenses: Somewhat hard  Food Insecurity: No Food Insecurity (11/20/2022)   Hunger Vital Sign    Worried About Running Out of Food in the Last Year: Never true    Ran Out of Food in the Last Year: Never true  Transportation Needs: No Transportation Needs (11/20/2022)   PRAPARE - Administrator, Civil Service (Medical): No    Lack of Transportation (Non-Medical): No  Physical Activity: Insufficiently Active (11/20/2022)   Exercise Vital Sign    Days of Exercise per Week: 3 days    Minutes of Exercise per Session: 30 min  Stress: Stress Concern Present (11/20/2022)   [employer] of Occupational Health - Occupational Stress Questionnaire    Feeling of Stress : To some extent  Social Connections: Socially Isolated (11/20/2022)   Social Connection and Isolation Panel [NHANES]    Frequency of Communication with Friends and Family: More than three times a week    Frequency of Social Gatherings with Friends and Family: Three times a week    Attends Religious Services: Never    Active Member of Clubs or Organizations: No    Attends Banker Meetings: Never    Marital Status: Never married    Allergies  Allergen Reactions   Other Shortness Of Breath and Other (See Comments)    Seasonal allergies- Wheezing   Contrast Media [Iodinated Contrast Media] Itching and Other (See Comments)    CT contrast    Outpatient Medications Prior to Visit  Medication Sig Dispense Refill   albuterol (VENTOLIN HFA) 108 (90 Base) MCG/ACT inhaler INHALE 2 PUFFS INTO THE LUNGS EVERY 6 HOURS AS NEEDED FOR WHEEZING OR SHORTNESS OF BREATH 8.5 g 11   amLODipine (NORVASC) 10 MG tablet Take 1 tablet (10 mg total) by mouth daily. 90 tablet 1   apixaban (ELIQUIS) 5 MG TABS tablet Take  1 tablet (5 mg total) by mouth 2 (two) times daily. 180 tablet 1   atorvastatin (LIPITOR) 20 MG tablet Take 1 tablet (20 mg total) by mouth daily. 90 tablet 1   carvedilol (COREG) 6.25 MG tablet Take 1 tablet (6.25 mg total) by mouth 2 (two) times daily with a meal. TAKE 1 TABLET(6.25 MG) BY MOUTH TWICE DAILY WITH A MEAL Strength: 6.25 mg 180 tablet 1   cloNIDine (CATAPRES) 0.2 MG tablet Take 1 tablet (0.2 mg total) by mouth 2 (two) times daily. TAKE 1 TABLET(0.2 MG) BY MOUTH TWICE DAILY Strength: 0.2 mg 180 tablet 1   EPINEPHrine 0.3 mg/0.3 mL IJ SOAJ injection Inject 0.3 mg into the  muscle as needed for anaphylaxis. 1 each 5   famotidine (PEPCID) 40 MG tablet Take 40 mg by mouth daily in the afternoon.     fluticasone (FLONASE) 50 MCG/ACT nasal spray SHAKE LIQUID AND USE 1 SPRAY IN EACH NOSTRIL DAILY AS NEEDED FOR ALLERGIES OR RHINITIS (Patient taking differently: Place 1 spray into both nostrils daily.) 16 g 0   Fluticasone-Umeclidin-Vilant (TRELEGY ELLIPTA) 200-62.5-25 MCG/ACT AEPB Inhale 1 puff into the lungs daily. 60 each 5   furosemide (LASIX) 20 MG tablet TAKE 1 TABLET(20 MG) BY MOUTH DAILY 90 tablet 0   ipratropium-albuterol (DUONEB) 0.5-2.5 (3) MG/3ML SOLN 1 neb every 6 hours if needed for asthma (Patient taking differently: Take 3 mLs by nebulization every 4 (four) hours as needed (shortness of breath).) 75 mL 12   levothyroxine (SYNTHROID) 50 MCG tablet Take 1 tablet (50 mcg total) by mouth daily. (Patient taking differently: Take 50 mcg by mouth daily before breakfast.) 90 tablet 1   montelukast (SINGULAIR) 10 MG tablet Take 1 tablet (10 mg total) by mouth at bedtime. 90 tablet 1   naloxone (NARCAN) nasal spray 4 mg/0.1 mL Place 1 spray into the nose once as needed (opioid overdose).     oxyCODONE-acetaminophen (PERCOCET) 10-325 MG tablet Take 1 tablet by mouth 5 (five) times daily as needed for pain. (Patient taking differently: Take 1 tablet by mouth 4 (four) times daily.) 150 tablet 0    potassium chloride SA (KLOR-CON M) 20 MEQ tablet Take 1 tablet (20 mEq total) by mouth daily. 90 tablet 1   solifenacin (VESICARE) 5 MG tablet Take 1 tablet (5 mg total) by mouth daily. 90 tablet 1   Tezepelumab-ekko (TEZSPIRE) 210 MG/1. SOAJ Inject 210 mg into the skin every 28 (twenty-eight) days. 1.91 mL 5   topiramate (TOPAMAX) 200 MG tablet Take 200 mg by mouth at bedtime.     Vitamin D, Ergocalciferol, (DRISDOL) 1.25 MG (50000 UNIT) CAPS capsule Take 50,000 Units by mouth every Friday.     zolpidem (AMBIEN) 10 MG tablet TAKE 1 TABLET BY MOUTH AT BEDTIME AS NEEDED FOR SLEEP 30 tablet 5   Semaglutide, 1 MG/DOSE, (OZEMPIC, 1 MG/DOSE,) 4 MG/3ML SOPN Inject 1 mg into the skin every Monday. (Patient not taking: Reported on 11/28/2022)     Facility-Administered Medications Prior to Visit  Medication Dose Route Frequency Provider Last Rate Last Admin   tezepelumab-ekko (TEZSPIRE) 210 MG/1. syringe 210 mg  210 mg Subcutaneous Once Jetty Duhamel D, MD         ROS Review of Systems  Constitutional:  Negative for activity change and appetite change.  HENT:  Negative for sinus pressure and sore throat.   Respiratory:  Negative for chest tightness, shortness of breath and wheezing.   Cardiovascular:  Negative for chest pain and palpitations.  Gastrointestinal:  Negative for abdominal distention, abdominal pain and constipation.  Genitourinary: Negative.   Musculoskeletal: Negative.   Neurological:  Positive for numbness.  Psychiatric/Behavioral:  Negative for behavioral problems and dysphoric mood.     Objective:  BP 116/78   Pulse 60   Ht 5\' 6"  (1.676 m)   Wt (!) 304 lb (137.9 kg)   SpO2 100%   BMI 49.07 kg/m      12/02/2022   11:11 AM 11/20/2022    9:13 PM 11/19/2022    2:37 PM  BP/Weight  Systolic BP 116 -- 155  Diastolic BP 78 -- 97  Wt. (Lbs) 304 311 311  BMI 49.07 kg/m2 50.2 kg/m2 50.2 kg/m2  Physical Exam Constitutional:      Appearance: She is  well-developed.  Cardiovascular:     Rate and Rhythm: Normal rate.     Heart sounds: Normal heart sounds. No murmur heard. Pulmonary:     Effort: Pulmonary effort is normal.     Breath sounds: Wheezing (chronic) present. No rales.  Chest:     Chest wall: No tenderness.  Abdominal:     General: Bowel sounds are normal. There is no distension.     Palpations: Abdomen is soft. There is no mass.     Tenderness: There is no abdominal tenderness.  Musculoskeletal:        General: Normal range of motion.     Right lower leg: No edema.     Left lower leg: No edema.  Neurological:     Mental Status: She is alert and oriented to person, place, and time.  Psychiatric:        Mood and Affect: Mood normal.        Latest Ref Rng & Units 07/20/2022    5:45 AM 07/19/2022    5:50 PM 07/01/2022    8:49 PM  CMP  Glucose 70 - 99 mg/dL 409  77  96   BUN 6 - 20 mg/dL 13  12  12    Creatinine 0.44 - 1.00 mg/dL 8.11  9.14  7.82   Sodium 135 - 145 mmol/L 134  132  136   Potassium 3.5 - 5.1 mmol/L 4.0  3.1  3.5   Chloride 98 - 111 mmol/L 103  99  106   CO2 22 - 32 mmol/L 21  21  21    Calcium 8.9 - 10.3 mg/dL 8.3  8.4  8.7     Lipid Panel     Component Value Date/Time   CHOL 192 10/16/2021 0506   CHOL 238 (H) 03/30/2020 1128   TRIG 51 10/16/2021 0506   HDL 99 10/16/2021 0506   HDL 45 03/30/2020 1128   CHOLHDL 1.9 10/16/2021 0506   VLDL 10 10/16/2021 0506   LDLCALC 83 10/16/2021 0506   LDLCALC 167 (H) 03/30/2020 1128    CBC    Component Value Date/Time   WBC 16.9 (H) 07/22/2022 0455   RBC 4.11 07/22/2022 0455   HGB 10.6 (L) 07/22/2022 0455   HGB 10.4 (L) 01/22/2018 1403   HCT 34.3 (L) 07/22/2022 0455   HCT 33.3 (L) 01/22/2018 1403   PLT 425 (H) 07/22/2022 0455   PLT 616 (H) 01/22/2018 1403   MCV 83.5 07/22/2022 0455   MCV 82 01/22/2018 1403   MCH 25.8 (L) 07/22/2022 0455   MCHC 30.9 07/22/2022 0455   RDW 17.5 (H) 07/22/2022 0455   RDW 17.1 (H) 01/22/2018 1403   LYMPHSABS 1.4  07/20/2022 0545   LYMPHSABS 3.3 (H) 01/22/2018 1403   MONOABS 0.3 07/20/2022 0545   EOSABS 0.0 07/20/2022 0545   EOSABS 0.3 01/22/2018 1403   BASOSABS 0.0 07/20/2022 0545   BASOSABS 0.1 01/22/2018 1403    Lab Results  Component Value Date   HGBA1C 5.4 11/18/2022    Assessment & Plan:      Vasomotor symptoms of menopause Uncontrolled with Clonidine. Discussed options including Gabapentin (previously tried and ineffective), hormones (patient concerned about weight gain), and Veozah (non-hormonal, new medication). Given patient's risk factors, Allyne Gee is the preferred choice. -Write prescription for [employer]  and attempt to get prior approval from insurance. -If Allyne Gee is not covered, refer to GYN for further management.  Type 2 Diabetes Well  controlled with A1c of 5.4. Patient has been off Ozempic for about a month and wishes to switch to oral form (Rybelsus) due to nausea with injections. -Advised that both Rybelsus and Ozempic are in the same pharmacological class and side effect profile will be similar -She has no contraindication against using Ozempic and does not have a phobia for needles -After shared decision making she is willing to continue with Ozempic  Peripheral Neuropathy Patient reports pain and tingling in feet. Gabapentin, Pregabalin (Lyrica), and Duloxetine (Cymbalta) have been tried with limited success. Patient currently on low dose Pregabalin (50mg ) prescribed by pain management. -Advise patient to discuss increasing Pregabalin dose with pain management doctor. -If symptoms persist despite dose increase, consider referral to neurologist.  Weight Management Patient expresses desire for bariatric surgery. -Place referral to bariatric surgeon in [place].  Asthma Persistent wheezing noted on exam. Patient reports occasional shortness of breath in the morning. -Continue current management and follow-up with pulmonologist on 12/31/2022.           Meds ordered  this encounter  Medications   DISCONTD: Fezolinetant (VEOZAH) 45 MG TABS    Sig: Take 1 tablet (45 mg total) by mouth daily.    Dispense:  90 tablet    Refill:  1   DISCONTD: Semaglutide, 1 MG/DOSE, (OZEMPIC, 1 MG/DOSE,) 4 MG/3ML SOPN    Sig: Inject 1 mg into the skin every Monday.    Dispense:  2 mL    Refill:  3   Fezolinetant (VEOZAH) 45 MG TABS    Sig: Take 1 tablet (45 mg total) by mouth daily.    Dispense:  90 tablet    Refill:  1   Semaglutide, 1 MG/DOSE, (OZEMPIC, 1 MG/DOSE,) 4 MG/3ML SOPN    Sig: Inject 1 mg into the skin every Monday.    Dispense:  2 mL    Refill:  3    Follow-up: Return for previously scheduled appointment.       Hoy Register, MD, FAAFP. [facility] [place], [place] [phone]   12/02/2022, 12:40 PM

## 2022-12-02 NOTE — Patient Instructions (Signed)
Managing Hot Flashes During Menopause You will learn what hot flashes/night sweats are, what causes them and what the treatment methods are. To view the content, go to this web address: [email or web address]  This video will expire on: 10/20/2024. If you need access to this video following this date, please reach out to the healthcare provider who assigned it to you. This information is not intended to replace advice given to you by your health care provider. Make sure you discuss any questions you have with your health care provider. Elsevier Patient Education  2024 [employer].

## 2022-12-04 ENCOUNTER — Inpatient Hospital Stay (HOSPITAL_COMMUNITY)
Admission: EM | Admit: 2022-12-04 | Discharge: 2022-12-07 | DRG: 190 | Disposition: A | Discharge status: Home Health | Payer: 59 | Attending: Internal Medicine | Admitting: Internal Medicine

## 2022-12-04 ENCOUNTER — Emergency Department (HOSPITAL_COMMUNITY): Payer: 59

## 2022-12-04 ENCOUNTER — Other Ambulatory Visit: Payer: Self-pay

## 2022-12-04 ENCOUNTER — Encounter (HOSPITAL_COMMUNITY): Payer: Self-pay | Admitting: Emergency Medicine

## 2022-12-04 DIAGNOSIS — Z6841 Body Mass Index (BMI) 40.0 and over, adult: Secondary | ICD-10-CM

## 2022-12-04 DIAGNOSIS — J9621 Acute and chronic respiratory failure with hypoxia: Secondary | ICD-10-CM | POA: Diagnosis present

## 2022-12-04 DIAGNOSIS — Z79899 Other long term (current) drug therapy: Secondary | ICD-10-CM

## 2022-12-04 DIAGNOSIS — F32A Depression, unspecified: Secondary | ICD-10-CM | POA: Diagnosis present

## 2022-12-04 DIAGNOSIS — Z91041 Radiographic dye allergy status: Secondary | ICD-10-CM

## 2022-12-04 DIAGNOSIS — G4733 Obstructive sleep apnea (adult) (pediatric): Secondary | ICD-10-CM | POA: Diagnosis present

## 2022-12-04 DIAGNOSIS — Z7901 Long term (current) use of anticoagulants: Secondary | ICD-10-CM

## 2022-12-04 DIAGNOSIS — E1141 Type 2 diabetes mellitus with diabetic mononeuropathy: Secondary | ICD-10-CM | POA: Diagnosis present

## 2022-12-04 DIAGNOSIS — N951 Menopausal and female climacteric states: Secondary | ICD-10-CM | POA: Diagnosis present

## 2022-12-04 DIAGNOSIS — I5032 Chronic diastolic (congestive) heart failure: Secondary | ICD-10-CM | POA: Diagnosis present

## 2022-12-04 DIAGNOSIS — Z79890 Hormone replacement therapy (postmenopausal): Secondary | ICD-10-CM

## 2022-12-04 DIAGNOSIS — F419 Anxiety disorder, unspecified: Secondary | ICD-10-CM | POA: Diagnosis present

## 2022-12-04 DIAGNOSIS — I480 Paroxysmal atrial fibrillation: Secondary | ICD-10-CM | POA: Diagnosis present

## 2022-12-04 DIAGNOSIS — I110 Hypertensive heart disease with heart failure: Secondary | ICD-10-CM | POA: Diagnosis present

## 2022-12-04 DIAGNOSIS — Z7985 Long-term (current) use of injectable non-insulin antidiabetic drugs: Secondary | ICD-10-CM

## 2022-12-04 DIAGNOSIS — E039 Hypothyroidism, unspecified: Secondary | ICD-10-CM | POA: Diagnosis present

## 2022-12-04 DIAGNOSIS — T380X5A Adverse effect of glucocorticoids and synthetic analogues, initial encounter: Secondary | ICD-10-CM | POA: Diagnosis present

## 2022-12-04 DIAGNOSIS — Z87891 Personal history of nicotine dependence: Secondary | ICD-10-CM

## 2022-12-04 DIAGNOSIS — D72829 Elevated white blood cell count, unspecified: Secondary | ICD-10-CM | POA: Diagnosis present

## 2022-12-04 DIAGNOSIS — J441 Chronic obstructive pulmonary disease with (acute) exacerbation: Principal | ICD-10-CM | POA: Diagnosis present

## 2022-12-04 DIAGNOSIS — Z1152 Encounter for screening for COVID-19: Secondary | ICD-10-CM

## 2022-12-04 DIAGNOSIS — K219 Gastro-esophageal reflux disease without esophagitis: Secondary | ICD-10-CM | POA: Diagnosis present

## 2022-12-04 DIAGNOSIS — Z825 Family history of asthma and other chronic lower respiratory diseases: Secondary | ICD-10-CM

## 2022-12-04 DIAGNOSIS — D519 Vitamin B12 deficiency anemia, unspecified: Secondary | ICD-10-CM | POA: Diagnosis present

## 2022-12-04 DIAGNOSIS — M069 Rheumatoid arthritis, unspecified: Secondary | ICD-10-CM | POA: Diagnosis present

## 2022-12-04 DIAGNOSIS — Z8249 Family history of ischemic heart disease and other diseases of the circulatory system: Secondary | ICD-10-CM

## 2022-12-04 DIAGNOSIS — Z809 Family history of malignant neoplasm, unspecified: Secondary | ICD-10-CM

## 2022-12-04 DIAGNOSIS — E876 Hypokalemia: Secondary | ICD-10-CM | POA: Diagnosis present

## 2022-12-04 DIAGNOSIS — Z7951 Long term (current) use of inhaled steroids: Secondary | ICD-10-CM

## 2022-12-04 LAB — CBC WITH DIFFERENTIAL/PLATELET
Abs Immature Granulocytes: 0.05 10*3/uL (ref 0.00–0.07)
Basophils Absolute: 0.1 10*3/uL (ref 0.0–0.1)
Basophils Relative: 0 %
Eosinophils Absolute: 0.2 10*3/uL (ref 0.0–0.5)
Eosinophils Relative: 1 %
HCT: 37.6 % (ref 36.0–46.0)
Hemoglobin: 11.5 g/dL — ABNORMAL LOW (ref 12.0–15.0)
Immature Granulocytes: 0 %
Lymphocytes Relative: 35 %
Lymphs Abs: 4.9 10*3/uL — ABNORMAL HIGH (ref 0.7–4.0)
MCH: 25.6 pg — ABNORMAL LOW (ref 26.0–34.0)
MCHC: 30.6 g/dL (ref 30.0–36.0)
MCV: 83.7 fL (ref 80.0–100.0)
Monocytes Absolute: 1.1 10*3/uL — ABNORMAL HIGH (ref 0.1–1.0)
Monocytes Relative: 8 %
Neutro Abs: 7.9 10*3/uL — ABNORMAL HIGH (ref 1.7–7.7)
Neutrophils Relative %: 56 %
Platelets: 371 10*3/uL (ref 150–400)
RBC: 4.49 MIL/uL (ref 3.87–5.11)
RDW: 18.7 % — ABNORMAL HIGH (ref 11.5–15.5)
WBC: 14.2 10*3/uL — ABNORMAL HIGH (ref 4.0–10.5)
nRBC: 0 % (ref 0.0–0.2)

## 2022-12-04 LAB — SARS CORONAVIRUS 2 BY RT PCR: SARS Coronavirus 2 by RT PCR: NEGATIVE

## 2022-12-04 MED ORDER — METHYLPREDNISOLONE SODIUM SUCC 125 MG IJ SOLR
125.0000 mg | INTRAMUSCULAR | Status: DC
  Administered 2022-12-04: 125 mg via INTRAVENOUS
  Filled 2022-12-04: qty 2

## 2022-12-04 MED ORDER — SODIUM CHLORIDE 0.9 % IV SOLN
500.0000 mg | Freq: Once | INTRAVENOUS | Status: AC
  Administered 2022-12-04: 500 mg via INTRAVENOUS
  Filled 2022-12-04: qty 5

## 2022-12-04 MED ORDER — MAGNESIUM SULFATE 2 GM/50ML IV SOLN
2.0000 g | Freq: Once | INTRAVENOUS | Status: AC
  Administered 2022-12-04: 2 g via INTRAVENOUS
  Filled 2022-12-04: qty 50

## 2022-12-04 MED ORDER — IPRATROPIUM-ALBUTEROL 0.5-2.5 (3) MG/3ML IN SOLN
9.0000 mL | Freq: Once | RESPIRATORY_TRACT | Status: AC
  Administered 2022-12-05: 9 mL via RESPIRATORY_TRACT
  Filled 2022-12-04: qty 3

## 2022-12-04 NOTE — ED Triage Notes (Signed)
Patient coming to ED for evaluation of shortness of breath.  Reports hx of asthma.  Has tried inhaler without relief.  Symptoms started a "couple of days ago."  C/o chest pain.  Increased RR and wheezing noted in triage. No reports of fever.  Has had mild cough.

## 2022-12-04 NOTE — ED Provider Notes (Signed)
[place] EMERGENCY DEPARTMENT AT [facility] Provider Note   CSN: [id number] Arrival date & time: 12/04/22  2144     History Chief Complaint  Patient presents with   Shortness of Breath    HPI April [name] is a 52 y.o. female presenting for AoC SOB.  Extensive medical history of heart failure COPD, multifocal medical conditions.  She is coming in with diffuse shortness of breath feeling that she cannot breathe.  She is unable to ambulate she is so short of breath. States that she has been sick for 3 days has been using her home albuterol inhaler without any symptomatic improvement.  She has not been on steroids recently she does not think. States has been compliant with all of her medications but continues to be ill.   Patient's recorded medical, surgical, social, medication list and allergies were reviewed in the Snapshot window as part of the initial history.   Review of Systems   Review of Systems  Constitutional:  Positive for fatigue. Negative for chills and fever.  HENT:  Negative for ear pain and sore throat.   Eyes:  Negative for pain and visual disturbance.  Respiratory:  Positive for shortness of breath and wheezing. Negative for cough.   Cardiovascular:  Negative for chest pain and palpitations.  Gastrointestinal:  Negative for abdominal pain and vomiting.  Genitourinary:  Negative for dysuria and hematuria.  Musculoskeletal:  Negative for arthralgias and back pain.  Skin:  Negative for color change and rash.  Neurological:  Negative for seizures and syncope.  All other systems reviewed and are negative.   Physical Exam Updated Vital Signs BP 123/68 (BP Location: Right Arm)   Pulse (!) 57   Temp 98.3 F (36.8 C) (Oral)   Resp 16   Ht 5\' 5"  (1.651 m)   Wt (!) 138.8 kg   LMP 11/06/2022 (Within Days)   SpO2 100%   BMI 50.92 kg/m  Physical Exam Vitals and nursing note reviewed.  Constitutional:      General: She is not in acute distress.     Appearance: She is well-developed.  HENT:     Head: Normocephalic and atraumatic.  Eyes:     Conjunctiva/sclera: Conjunctivae normal.  Cardiovascular:     Rate and Rhythm: Normal rate and regular rhythm.     Heart sounds: No murmur heard. Pulmonary:     Effort: Pulmonary effort is normal. Tachypnea present. No respiratory distress.     Breath sounds: Wheezing and rhonchi present.  Abdominal:     General: There is no distension.     Palpations: Abdomen is soft.     Tenderness: There is no abdominal tenderness. There is no right CVA tenderness or left CVA tenderness.  Musculoskeletal:        General: No swelling or tenderness. Normal range of motion.     Cervical back: Neck supple.  Skin:    General: Skin is warm and dry.  Neurological:     General: No focal deficit present.     Mental Status: She is alert and oriented to person, place, and time. Mental status is at baseline.     Cranial Nerves: No cranial nerve deficit.      ED Course/ Medical Decision Making/ A&P    Procedures Procedures   Medications Ordered in ED Medications  amLODipine (NORVASC) tablet 10 mg (10 mg Oral Given 12/07/22 0921)  atorvastatin (LIPITOR) tablet 20 mg (20 mg Oral Given 12/07/22 0922)  carvedilol (COREG) tablet 6.25  mg (6.25 mg Oral Given 12/07/22 0920)  cloNIDine (CATAPRES) tablet 0.2 mg (0.2 mg Oral Given 12/07/22 0920)  furosemide (LASIX) tablet 20 mg (20 mg Oral Given 12/07/22 0922)  Fezolinetant TABS 45 mg (45 mg Oral Not Given 12/07/22 0922)  famotidine (PEPCID) tablet 40 mg (40 mg Oral Given 12/06/22 2137)  fesoterodine (TOVIAZ) tablet 4 mg (4 mg Oral Given 12/07/22 0922)  apixaban (ELIQUIS) tablet 5 mg (5 mg Oral Given 12/07/22 0920)  topiramate (TOPAMAX) tablet 200 mg (200 mg Oral Given 12/06/22 2137)  fluticasone (FLONASE) 50 MCG/ACT nasal spray 1 spray (1 spray Each Nare Patient Refused/Not Given 12/07/22 0922)  montelukast (SINGULAIR) tablet 10 mg (10 mg Oral Given 12/06/22 2137)  sodium  chloride flush (NS) 0.9 % injection 3 mL (3 mLs Intravenous Given 12/07/22 0923)  acetaminophen (TYLENOL) tablet 650 mg (has no administration in time range)    Or  acetaminophen (TYLENOL) suppository 650 mg (has no administration in time range)  senna-docusate (Senokot-S) tablet 1 tablet (has no administration in time range)  bisacodyl (DULCOLAX) EC tablet 5 mg (has no administration in time range)  ondansetron (ZOFRAN) tablet 4 mg (has no administration in time range)    Or  ondansetron (ZOFRAN) injection 4 mg (has no administration in time range)  hydrALAZINE (APRESOLINE) injection 5 mg (has no administration in time range)  azithromycin (ZITHROMAX) 500 mg in sodium chloride 0.9 % 250 mL IVPB (500 mg Intravenous Not Given 12/05/22 0307)    Followed by  azithromycin (ZITHROMAX) tablet 500 mg (500 mg Oral Given 12/06/22 2136)  insulin aspart (novoLOG) injection 0-5 Units ( Subcutaneous Not Given 12/06/22 2136)  levothyroxine (SYNTHROID) tablet 50 mcg (50 mcg Oral Given 12/07/22 0508)  methylPREDNISolone sodium succinate (SOLU-MEDROL) 40 mg/mL injection 40 mg (40 mg Intravenous Given 12/05/22 2109)    Followed by  predniSONE (DELTASONE) tablet 40 mg (40 mg Oral Given 12/07/22 0919)  budesonide (PULMICORT) nebulizer solution 0.25 mg (0.25 mg Nebulization Given 12/07/22 0849)  arformoterol (BROVANA) nebulizer solution 15 mcg (15 mcg Nebulization Given 12/07/22 0849)  albuterol (PROVENTIL) (2.5 MG/3ML) 0.083% nebulizer solution 2.5 mg (has no administration in time range)  zolpidem (AMBIEN) tablet 5 mg (5 mg Oral Given 12/06/22 2137)  cyanocobalamin (VITAMIN B12) tablet 1,000 mcg (1,000 mcg Oral Given 12/07/22 0921)  insulin aspart (novoLOG) injection 0-9 Units (0 Units Subcutaneous Not Given 12/07/22 1408)  clonazePAM (KLONOPIN) tablet 0.5 mg (0.5 mg Oral Given 12/06/22 2141)  oxyCODONE-acetaminophen (PERCOCET/ROXICET) 5-325 MG per tablet 1 tablet (1 tablet Oral Given 12/07/22 0825)    And  oxyCODONE (Oxy  IR/ROXICODONE) immediate release tablet 5 mg (5 mg Oral Given 12/07/22 0825)  ipratropium-albuterol (DUONEB) 0.5-2.5 (3) MG/3ML nebulizer solution 9 mL (9 mLs Nebulization Given 12/05/22 0004)  azithromycin (ZITHROMAX) 500 mg in sodium chloride 0.9 % 250 mL IVPB (0 mg Intravenous Stopped 12/05/22 0112)  magnesium sulfate IVPB 2 g 50 mL (0 g Intravenous Stopped 12/05/22 0144)  albuterol (PROVENTIL) (2.5 MG/3ML) 0.083% nebulizer solution (10 mg/hr Nebulization Given 12/05/22 0053)  ipratropium (ATROVENT) nebulizer solution 0.5 mg (0.5 mg Nebulization Given 12/05/22 0053)  oxyCODONE-acetaminophen (PERCOCET/ROXICET) 5-325 MG per tablet 1 tablet (1 tablet Oral Given 12/05/22 0112)  potassium chloride SA (KLOR-CON M) CR tablet 40 mEq (40 mEq Oral Given 12/05/22 0846)  clonazePAM (KLONOPIN) tablet 0.5 mg (0.5 mg Oral Given 12/05/22 2330)   Medical Decision Making:   LEALAH URBANIAK is a 51 y.o. female with a history of COPD, who presented to the ED today with  acute on chronic SOB. They are endorsing worsening of their baseline dyspnea over the past 72 hours. Their baseline is a 0L O2 requirement. At their baseline they are able to get around the neighborhood and they are not able to at this time.   On my initial exam, the pt was SOB and tachypneic. Audible wheezing and grossly decreased breath sounds appreciated.  They are endorsing increased sputum production.    Reviewed and confirmed nursing documentation for past medical history, family history, social history.    Initial Assessment:   With the patient's presentation of SOB in the above setting, most likely diagnosis is COPD Exacerbation. Other diagnoses were considered including (but not limited to) CAP, PE, ACS, viral infection, PTX. These are considered less likely due to history of present illness and physical exam findings.   This is most consistent with an acute life/limb threatening illness complicated by underlying chronic conditions.  Initial Plan:  Empiric  treatment of patient's symptoms with immediate initiation of inhaled bronchodilators and IV steroids. Given advanced nature of patient's presentation, will proceed with IV magnesium as a rescue therapy.   Evaluation for ACS with EKG and delta troponin  Evaluation for infectious versus intrathoracic abnormality with chest x-ray  Evaluation for volume overload with BNP  Screening labs including CBC and Metabolic panel to evaluate for infectious or metabolic etiology of disease.  Patient's Wells score is low and patient does not warrant further objective evaluation for PE based on consistency of presentation of alternative diagnosis.  Objective evaluation as below reviewed   Initial Study Results:   Laboratory  All laboratory results reviewed without evidence of clinically relevant pathology.    EKG EKG was reviewed independently. Rate, rhythm, axis, intervals all examined and without medically relevant abnormality. ST segments without concerns for elevations.    Radiology:  All images reviewed independently. Agree with radiology report at this time.   ECHOCARDIOGRAM COMPLETE  Result Date: 12/06/2022    ECHOCARDIOGRAM REPORT   Patient Name:   HADEEL MENSINGER Date of Exam: 12/06/2022 Medical Rec #:  [id number]    Height:       65.0 in Accession #:    [id number]   Weight:       306.0 lb Date of Birth:  01-Aug-1970     BSA:          2.370 m Patient Age:    52 years     BP:           151/81 mmHg Patient Gender: F            HR:           66 bpm. Exam Location:  Inpatient Procedure: 2D Echo, Color Doppler and Cardiac Doppler Indications:    Dyspnea  History:        Patient has prior history of Echocardiogram examinations, most                 recent 10/15/2021. CHF, COPD; Risk Factors:Sleep Apnea,                 Hypertension, Dyslipidemia and Former Smoker.  Sonographer:    Milbert Coulter Referring Phys: [id number] Monica Martinez [facility]  Sonographer Comments: Patient is obese. Image acquisition challenging due to patient  body habitus. IMPRESSIONS  1. Left ventricular ejection fraction, by estimation, is 60 to 65%. The left ventricle has normal function. The left ventricle has no regional wall motion abnormalities. There is mild concentric left ventricular hypertrophy. Left ventricular  diastolic parameters are consistent with Grade I diastolic dysfunction (impaired relaxation).  2. Right ventricular systolic function is normal. The right ventricular size is normal. Tricuspid regurgitation signal is inadequate for assessing PA pressure.  3. The mitral valve is grossly normal. No evidence of mitral valve regurgitation. No evidence of mitral stenosis.  4. The aortic valve is tricuspid. Aortic valve regurgitation is not visualized. No aortic stenosis is present.  5. The inferior vena cava is normal in size with greater than 50% respiratory variability, suggesting right atrial pressure of 3 mmHg. Comparison(s): No significant change from prior study. FINDINGS  Left Ventricle: Left ventricular ejection fraction, by estimation, is 60 to 65%. The left ventricle has normal function. The left ventricle has no regional wall motion abnormalities. The left ventricular internal cavity size was normal in size. There is  mild concentric left ventricular hypertrophy. Left ventricular diastolic parameters are consistent with Grade I diastolic dysfunction (impaired relaxation). Right Ventricle: The right ventricular size is normal. No increase in right ventricular wall thickness. Right ventricular systolic function is normal. Tricuspid regurgitation signal is inadequate for assessing PA pressure. Left Atrium: Left atrial size was normal in size. Right Atrium: Right atrial size was normal in size. Pericardium: There is no evidence of pericardial effusion. Mitral Valve: The mitral valve is grossly normal. No evidence of mitral valve regurgitation. No evidence of mitral valve stenosis. Tricuspid Valve: The tricuspid valve is grossly normal. Tricuspid  valve regurgitation is trivial. No evidence of tricuspid stenosis. Aortic Valve: The aortic valve is tricuspid. Aortic valve regurgitation is not visualized. No aortic stenosis is present. Aortic valve mean gradient measures 5.0 mmHg. Aortic valve peak gradient measures 9.7 mmHg. Pulmonic Valve: The pulmonic valve was grossly normal. Pulmonic valve regurgitation is not visualized. No evidence of pulmonic stenosis. Aorta: The aortic root is normal in size and structure. Venous: The inferior vena cava is normal in size with greater than 50% respiratory variability, suggesting right atrial pressure of 3 mmHg. IAS/Shunts: The atrial septum is grossly normal.  LEFT VENTRICLE PLAX 2D LVIDd:         4.40 cm Diastology LVIDs:         3.00 cm LV e' medial:    6.85 cm/s LV PW:         1.30 cm LV E/e' medial:  11.3 LV IVS:        1.40 cm LV e' lateral:   7.29 cm/s                        LV E/e' lateral: 10.6  RIGHT VENTRICLE RV S prime:     16.00 cm/s TAPSE (M-mode): 3.1 cm LEFT ATRIUM             Index        RIGHT ATRIUM           Index LA diam:        3.90 cm 1.65 cm/m   RA Area:     14.40 cm LA Vol (A2C):   46.7 ml 19.70 ml/m  RA Volume:   34.50 ml  14.56 ml/m LA Vol (A4C):   55.0 ml 23.21 ml/m LA Biplane Vol: 53.0 ml 22.36 ml/m  AORTIC VALVE AV Vmax:           156.00 cm/s AV Vmean:          102.000 cm/s AV VTI:            0.305 m AV Peak  Grad:      9.7 mmHg AV Mean Grad:      5.0 mmHg LVOT Vmax:         116.00 cm/s LVOT Vmean:        75.000 cm/s LVOT VTI:          0.239 m LVOT/AV VTI ratio: 0.78  AORTA Ao Root diam: 3.00 cm MITRAL VALVE MV Area (PHT): 3.93 cm    SHUNTS MV Decel Time: 193 msec    Systemic VTI: 0.24 m MV E velocity: 77.10 cm/s MV A velocity: 85.30 cm/s MV E/A ratio:  0.90 Lennie Odor MD Electronically signed by Lennie Odor MD Signature Date/Time: 12/06/2022/5:02:01 PM    Final    DG Chest Portable 1 View  Result Date: 12/04/2022 CLINICAL DATA:  Shortness of breath EXAM: PORTABLE CHEST 1 VIEW  COMPARISON:  09/18/2022 FINDINGS: Lungs are clear.  No pleural effusion or pneumothorax. The heart is normal in size. IMPRESSION: No acute cardiopulmonary disease. Electronically Signed   By: Charline Bills M.D.   On: 12/04/2022 22:44     Final Assessment and Plan:   After initiation of medical therapies, patient is grossly improved and no longer in acute distress.    Given the advanced nature of the patient's prenetation, patient will require advanced care and admission was arranged.      Clinical Impression:  1. COPD exacerbation (HCC)       Admit   Final Clinical Impression(s) / ED Diagnoses Final diagnoses:  COPD exacerbation (HCC)    Rx / DC Orders ED Discharge Orders          Ordered    predniSONE (DELTASONE) 20 MG tablet  Daily with breakfast        12/07/22 1322    cyanocobalamin 1000 MCG tablet  Daily        12/07/22 1322    azithromycin (ZITHROMAX) 500 MG tablet  Daily        12/07/22 1322    albuterol (VENTOLIN HFA) 108 (90 Base) MCG/ACT inhaler  Every 6 hours PRN        12/07/22 1322    Increase activity slowly        12/07/22 1322    Diet - low sodium heart healthy        12/07/22 1322              Glyn Ade, MD 12/07/22 1458

## 2022-12-05 ENCOUNTER — Encounter: Payer: Self-pay | Admitting: Internal Medicine

## 2022-12-05 DIAGNOSIS — Z7901 Long term (current) use of anticoagulants: Secondary | ICD-10-CM | POA: Diagnosis not present

## 2022-12-05 DIAGNOSIS — G4733 Obstructive sleep apnea (adult) (pediatric): Secondary | ICD-10-CM | POA: Diagnosis present

## 2022-12-05 DIAGNOSIS — F32A Depression, unspecified: Secondary | ICD-10-CM | POA: Diagnosis present

## 2022-12-05 DIAGNOSIS — Z79890 Hormone replacement therapy (postmenopausal): Secondary | ICD-10-CM | POA: Diagnosis not present

## 2022-12-05 DIAGNOSIS — M069 Rheumatoid arthritis, unspecified: Secondary | ICD-10-CM | POA: Diagnosis present

## 2022-12-05 DIAGNOSIS — K219 Gastro-esophageal reflux disease without esophagitis: Secondary | ICD-10-CM | POA: Diagnosis present

## 2022-12-05 DIAGNOSIS — R0609 Other forms of dyspnea: Secondary | ICD-10-CM | POA: Diagnosis not present

## 2022-12-05 DIAGNOSIS — N951 Menopausal and female climacteric states: Secondary | ICD-10-CM | POA: Diagnosis present

## 2022-12-05 DIAGNOSIS — I5032 Chronic diastolic (congestive) heart failure: Secondary | ICD-10-CM | POA: Diagnosis present

## 2022-12-05 DIAGNOSIS — Z87891 Personal history of nicotine dependence: Secondary | ICD-10-CM | POA: Diagnosis not present

## 2022-12-05 DIAGNOSIS — Z1152 Encounter for screening for COVID-19: Secondary | ICD-10-CM | POA: Diagnosis not present

## 2022-12-05 DIAGNOSIS — T380X5A Adverse effect of glucocorticoids and synthetic analogues, initial encounter: Secondary | ICD-10-CM | POA: Diagnosis present

## 2022-12-05 DIAGNOSIS — E1141 Type 2 diabetes mellitus with diabetic mononeuropathy: Secondary | ICD-10-CM | POA: Diagnosis present

## 2022-12-05 DIAGNOSIS — J441 Chronic obstructive pulmonary disease with (acute) exacerbation: Secondary | ICD-10-CM | POA: Diagnosis present

## 2022-12-05 DIAGNOSIS — D519 Vitamin B12 deficiency anemia, unspecified: Secondary | ICD-10-CM | POA: Diagnosis present

## 2022-12-05 DIAGNOSIS — E876 Hypokalemia: Secondary | ICD-10-CM | POA: Diagnosis present

## 2022-12-05 DIAGNOSIS — D72829 Elevated white blood cell count, unspecified: Secondary | ICD-10-CM | POA: Diagnosis present

## 2022-12-05 DIAGNOSIS — I110 Hypertensive heart disease with heart failure: Secondary | ICD-10-CM | POA: Diagnosis present

## 2022-12-05 DIAGNOSIS — Z8249 Family history of ischemic heart disease and other diseases of the circulatory system: Secondary | ICD-10-CM | POA: Diagnosis not present

## 2022-12-05 DIAGNOSIS — I480 Paroxysmal atrial fibrillation: Secondary | ICD-10-CM | POA: Diagnosis present

## 2022-12-05 DIAGNOSIS — J9621 Acute and chronic respiratory failure with hypoxia: Secondary | ICD-10-CM | POA: Diagnosis present

## 2022-12-05 DIAGNOSIS — F419 Anxiety disorder, unspecified: Secondary | ICD-10-CM | POA: Diagnosis present

## 2022-12-05 DIAGNOSIS — Z6841 Body Mass Index (BMI) 40.0 and over, adult: Secondary | ICD-10-CM | POA: Diagnosis not present

## 2022-12-05 DIAGNOSIS — E039 Hypothyroidism, unspecified: Secondary | ICD-10-CM | POA: Diagnosis present

## 2022-12-05 LAB — COMPREHENSIVE METABOLIC PANEL
ALT: 13 U/L (ref 0–44)
AST: 14 U/L — ABNORMAL LOW (ref 15–41)
Albumin: 3.4 g/dL — ABNORMAL LOW (ref 3.5–5.0)
Alkaline Phosphatase: 68 U/L (ref 38–126)
Anion gap: 9 (ref 5–15)
BUN: 19 mg/dL (ref 6–20)
CO2: 16 mmol/L — ABNORMAL LOW (ref 22–32)
Calcium: 8.5 mg/dL — ABNORMAL LOW (ref 8.9–10.3)
Chloride: 112 mmol/L — ABNORMAL HIGH (ref 98–111)
Creatinine, Ser: 0.96 mg/dL (ref 0.44–1.00)
GFR, Estimated: >60 mL/min (ref >60)
Glucose, Bld: 199 mg/dL — ABNORMAL HIGH (ref 70–99)
Potassium: 3.3 mmol/L — ABNORMAL LOW (ref 3.5–5.1)
Sodium: 137 mmol/L (ref 135–145)
Total Bilirubin: 0.1 mg/dL — ABNORMAL LOW (ref 0.3–1.2)
Total Protein: 7.1 g/dL (ref 6.5–8.1)

## 2022-12-05 LAB — CBC
HCT: 34.6 % — ABNORMAL LOW (ref 36.0–46.0)
Hemoglobin: 10.9 g/dL — ABNORMAL LOW (ref 12.0–15.0)
MCH: 26.3 pg (ref 26.0–34.0)
MCHC: 31.5 g/dL (ref 30.0–36.0)
MCV: 83.4 fL (ref 80.0–100.0)
Platelets: 315 10*3/uL (ref 150–400)
RBC: 4.15 MIL/uL (ref 3.87–5.11)
RDW: 18.8 % — ABNORMAL HIGH (ref 11.5–15.5)
WBC: 14.7 10*3/uL — ABNORMAL HIGH (ref 4.0–10.5)
nRBC: 0 % (ref 0.0–0.2)

## 2022-12-05 LAB — GLUCOSE, CAPILLARY: Glucose-Capillary: 105 mg/dL — ABNORMAL HIGH (ref 70–99)

## 2022-12-05 LAB — CBG MONITORING, ED
Glucose-Capillary: 171 mg/dL — ABNORMAL HIGH (ref 70–99)
Glucose-Capillary: 150 mg/dL — ABNORMAL HIGH (ref 70–99)
Glucose-Capillary: 129 mg/dL — ABNORMAL HIGH (ref 70–99)

## 2022-12-05 MED ORDER — LEVOTHYROXINE SODIUM 50 MCG PO TABS: 50.0000 ug | ORAL_TABLET | Freq: Every day | ORAL | Status: DC

## 2022-12-05 MED ORDER — UMECLIDINIUM BROMIDE 62.5 MCG/ACT IN AEPB
1.0000 | INHALATION_SPRAY | Freq: Every day | RESPIRATORY_TRACT | Status: DC
  Administered 2022-12-05: 1 via RESPIRATORY_TRACT
  Filled 2022-12-05: qty 7

## 2022-12-05 MED ORDER — ALBUTEROL SULFATE (2.5 MG/3ML) 0.083% IN NEBU: 2.5000 mg | INHALATION_SOLUTION | RESPIRATORY_TRACT | Status: DC | PRN

## 2022-12-05 MED ORDER — PREDNISONE 20 MG PO TABS: 40.0000 mg | ORAL_TABLET | Freq: Every day | ORAL | Status: DC

## 2022-12-05 MED ORDER — OXYCODONE-ACETAMINOPHEN 10-325 MG PO TABS: 1.0000 | ORAL_TABLET | Freq: Four times a day (QID) | ORAL | Status: DC | PRN

## 2022-12-05 MED ORDER — SODIUM CHLORIDE 0.9 % IV SOLN: 500.0000 mg | INTRAVENOUS | Status: AC

## 2022-12-05 MED ORDER — TOPIRAMATE 100 MG PO TABS
200.0000 mg | ORAL_TABLET | Freq: Every day | ORAL | Status: DC
  Administered 2022-12-05 – 2022-12-06 (×2): 200 mg via ORAL
  Filled 2022-12-05 (×2): qty 2

## 2022-12-05 MED ORDER — FUROSEMIDE 20 MG PO TABS
20.0000 mg | ORAL_TABLET | Freq: Every day | ORAL | Status: DC
  Administered 2022-12-05 – 2022-12-07 (×3): 20 mg via ORAL
  Filled 2022-12-05 (×3): qty 1

## 2022-12-05 MED ORDER — ZOLPIDEM TARTRATE 5 MG PO TABS
5.0000 mg | ORAL_TABLET | Freq: Every day | ORAL | Status: DC
  Administered 2022-12-05 – 2022-12-06 (×2): 5 mg via ORAL
  Filled 2022-12-05 (×2): qty 1

## 2022-12-05 MED ORDER — INSULIN ASPART 100 UNIT/ML IJ SOLN
0.0000 [IU] | Freq: Every day | INTRAMUSCULAR | Status: DC
  Filled 2022-12-05: qty 0.05

## 2022-12-05 MED ORDER — ONDANSETRON HCL 4 MG/2ML IJ SOLN: 4.0000 mg | Freq: Four times a day (QID) | INTRAMUSCULAR | Status: DC | PRN

## 2022-12-05 MED ORDER — FEZOLINETANT 45 MG PO TABS: 45.0000 mg | ORAL_TABLET | Freq: Every day | ORAL | Status: DC

## 2022-12-05 MED ORDER — CLONAZEPAM 0.5 MG PO TABS
0.5000 mg | ORAL_TABLET | Freq: Once | ORAL | Status: AC
  Administered 2022-12-05: 0.5 mg via ORAL
  Filled 2022-12-05: qty 1

## 2022-12-05 MED ORDER — FAMOTIDINE 20 MG PO TABS
40.0000 mg | ORAL_TABLET | Freq: Every day | ORAL | Status: DC
  Administered 2022-12-05 – 2022-12-06 (×2): 40 mg via ORAL
  Filled 2022-12-05 (×3): qty 2

## 2022-12-05 MED ORDER — POTASSIUM CHLORIDE CRYS ER 20 MEQ PO TBCR: 20.0000 meq | EXTENDED_RELEASE_TABLET | Freq: Every day | ORAL | Status: DC

## 2022-12-05 MED ORDER — BUDESONIDE 0.25 MG/2ML IN SUSP
0.2500 mg | Freq: Two times a day (BID) | RESPIRATORY_TRACT | Status: DC
  Administered 2022-12-05 – 2022-12-07 (×5): 0.25 mg via RESPIRATORY_TRACT
  Filled 2022-12-05 (×5): qty 2

## 2022-12-05 MED ORDER — AMLODIPINE BESYLATE 10 MG PO TABS
10.0000 mg | ORAL_TABLET | Freq: Every day | ORAL | Status: DC
  Administered 2022-12-05 – 2022-12-07 (×3): 10 mg via ORAL
  Filled 2022-12-05: qty 1
  Filled 2022-12-05: qty 2
  Filled 2022-12-05: qty 1

## 2022-12-05 MED ORDER — FLUTICASONE FUROATE-VILANTEROL 200-25 MCG/ACT IN AEPB
1.0000 | INHALATION_SPRAY | Freq: Every day | RESPIRATORY_TRACT | Status: DC
  Administered 2022-12-05: 1 via RESPIRATORY_TRACT
  Filled 2022-12-05: qty 28

## 2022-12-05 MED ORDER — SENNOSIDES-DOCUSATE SODIUM 8.6-50 MG PO TABS: 1.0000 | ORAL_TABLET | Freq: Every evening | ORAL | Status: DC | PRN

## 2022-12-05 MED ORDER — METHYLPREDNISOLONE SODIUM SUCC 40 MG IJ SOLR
40.0000 mg | Freq: Two times a day (BID) | INTRAMUSCULAR | Status: AC
  Administered 2022-12-05 (×2): 40 mg via INTRAVENOUS
  Filled 2022-12-05 (×2): qty 1

## 2022-12-05 MED ORDER — MORPHINE SULFATE (PF) 2 MG/ML IV SOLN
1.0000 mg | Freq: Four times a day (QID) | INTRAVENOUS | Status: DC | PRN
  Administered 2022-12-05: 1 mg via INTRAVENOUS
  Filled 2022-12-05: qty 1

## 2022-12-05 MED ORDER — TRAZODONE HCL 50 MG PO TABS
25.0000 mg | ORAL_TABLET | Freq: Every evening | ORAL | Status: DC | PRN
  Administered 2022-12-05: 25 mg via ORAL
  Filled 2022-12-05: qty 1

## 2022-12-05 MED ORDER — BISACODYL 5 MG PO TBEC: 5.0000 mg | DELAYED_RELEASE_TABLET | Freq: Every day | ORAL | Status: DC | PRN

## 2022-12-05 MED ORDER — ALBUTEROL SULFATE (2.5 MG/3ML) 0.083% IN NEBU
2.5000 mg | INHALATION_SOLUTION | Freq: Four times a day (QID) | RESPIRATORY_TRACT | Status: DC
  Administered 2022-12-05: 2.5 mg via RESPIRATORY_TRACT
  Filled 2022-12-05: qty 3

## 2022-12-05 MED ORDER — MONTELUKAST SODIUM 10 MG PO TABS
10.0000 mg | ORAL_TABLET | Freq: Every day | ORAL | Status: DC
  Administered 2022-12-05 – 2022-12-06 (×2): 10 mg via ORAL
  Filled 2022-12-05 (×3): qty 1

## 2022-12-05 MED ORDER — IPRATROPIUM BROMIDE 0.02 % IN SOLN
0.5000 mg | Freq: Once | RESPIRATORY_TRACT | Status: AC
  Administered 2022-12-05: 0.5 mg via RESPIRATORY_TRACT
  Filled 2022-12-05: qty 2.5

## 2022-12-05 MED ORDER — CARVEDILOL 6.25 MG PO TABS
6.2500 mg | ORAL_TABLET | Freq: Two times a day (BID) | ORAL | Status: DC
  Administered 2022-12-05 – 2022-12-07 (×5): 6.25 mg via ORAL
  Filled 2022-12-05: qty 1
  Filled 2022-12-05 (×4): qty 2

## 2022-12-05 MED ORDER — AZITHROMYCIN 250 MG PO TABS
500.0000 mg | ORAL_TABLET | Freq: Every day | ORAL | Status: DC
  Administered 2022-12-05 – 2022-12-06 (×2): 500 mg via ORAL
  Filled 2022-12-05 (×2): qty 2

## 2022-12-05 MED ORDER — INSULIN ASPART 100 UNIT/ML IJ SOLN
0.0000 [IU] | Freq: Three times a day (TID) | INTRAMUSCULAR | Status: DC
  Administered 2022-12-05 (×2): 2 [IU] via SUBCUTANEOUS
  Administered 2022-12-05: 3 [IU] via SUBCUTANEOUS
  Filled 2022-12-05: qty 0.15

## 2022-12-05 MED ORDER — IPRATROPIUM-ALBUTEROL 0.5-2.5 (3) MG/3ML IN SOLN
3.0000 mL | Freq: Four times a day (QID) | RESPIRATORY_TRACT | Status: DC
  Administered 2022-12-05 (×2): 3 mL via RESPIRATORY_TRACT
  Filled 2022-12-05 (×2): qty 3

## 2022-12-05 MED ORDER — OXYCODONE HCL 5 MG PO TABS
5.0000 mg | ORAL_TABLET | Freq: Four times a day (QID) | ORAL | Status: DC | PRN
  Administered 2022-12-05 – 2022-12-06 (×3): 5 mg via ORAL
  Filled 2022-12-05 (×3): qty 1

## 2022-12-05 MED ORDER — OXYCODONE-ACETAMINOPHEN 5-325 MG PO TABS
1.0000 | ORAL_TABLET | Freq: Once | ORAL | Status: AC
  Administered 2022-12-05: 1 via ORAL
  Filled 2022-12-05: qty 1

## 2022-12-05 MED ORDER — LEVOTHYROXINE SODIUM 50 MCG PO TABS
50.0000 ug | ORAL_TABLET | Freq: Every day | ORAL | Status: DC
  Administered 2022-12-05 – 2022-12-07 (×3): 50 ug via ORAL
  Filled 2022-12-05 (×3): qty 1

## 2022-12-05 MED ORDER — ACETAMINOPHEN 325 MG PO TABS: 650.0000 mg | ORAL_TABLET | Freq: Four times a day (QID) | ORAL | Status: DC | PRN

## 2022-12-05 MED ORDER — APIXABAN 5 MG PO TABS
5.0000 mg | ORAL_TABLET | Freq: Two times a day (BID) | ORAL | Status: DC
  Administered 2022-12-05 – 2022-12-07 (×6): 5 mg via ORAL
  Filled 2022-12-05 (×6): qty 1

## 2022-12-05 MED ORDER — METHYLPREDNISOLONE SODIUM SUCC 40 MG IJ SOLR: 40.0000 mg | Freq: Two times a day (BID) | INTRAMUSCULAR | Status: DC

## 2022-12-05 MED ORDER — ALBUTEROL SULFATE (2.5 MG/3ML) 0.083% IN NEBU
10.0000 mg/h | INHALATION_SOLUTION | Freq: Once | RESPIRATORY_TRACT | Status: AC
  Administered 2022-12-05: 10 mg/h via RESPIRATORY_TRACT
  Filled 2022-12-05: qty 12

## 2022-12-05 MED ORDER — SODIUM CHLORIDE 0.9% FLUSH
3.0000 mL | Freq: Two times a day (BID) | INTRAVENOUS | Status: DC
  Administered 2022-12-05 – 2022-12-07 (×6): 3 mL via INTRAVENOUS

## 2022-12-05 MED ORDER — ACETAMINOPHEN 650 MG RE SUPP: 650.0000 mg | Freq: Four times a day (QID) | RECTAL | Status: DC | PRN

## 2022-12-05 MED ORDER — PREDNISONE 20 MG PO TABS
40.0000 mg | ORAL_TABLET | Freq: Every day | ORAL | Status: DC
  Administered 2022-12-06 – 2022-12-07 (×2): 40 mg via ORAL
  Filled 2022-12-05 (×2): qty 2

## 2022-12-05 MED ORDER — FLUTICASONE PROPIONATE 50 MCG/ACT NA SUSP
1.0000 | Freq: Every day | NASAL | Status: DC
  Administered 2022-12-05: 1 via NASAL
  Filled 2022-12-05: qty 16

## 2022-12-05 MED ORDER — HYDRALAZINE HCL 20 MG/ML IJ SOLN: 5.0000 mg | Freq: Three times a day (TID) | INTRAMUSCULAR | Status: DC | PRN

## 2022-12-05 MED ORDER — IPRATROPIUM-ALBUTEROL 0.5-2.5 (3) MG/3ML IN SOLN
3.0000 mL | Freq: Four times a day (QID) | RESPIRATORY_TRACT | Status: DC
  Administered 2022-12-06 (×3): 3 mL via RESPIRATORY_TRACT
  Filled 2022-12-05 (×3): qty 3

## 2022-12-05 MED ORDER — POTASSIUM CHLORIDE CRYS ER 20 MEQ PO TBCR
40.0000 meq | EXTENDED_RELEASE_TABLET | Freq: Once | ORAL | Status: AC
  Administered 2022-12-05: 40 meq via ORAL
  Filled 2022-12-05: qty 2

## 2022-12-05 MED ORDER — IPRATROPIUM-ALBUTEROL 0.5-2.5 (3) MG/3ML IN SOLN
3.0000 mL | RESPIRATORY_TRACT | Status: DC | PRN
  Administered 2022-12-05: 3 mL via RESPIRATORY_TRACT
  Filled 2022-12-05: qty 3

## 2022-12-05 MED ORDER — ONDANSETRON HCL 4 MG PO TABS: 4.0000 mg | ORAL_TABLET | Freq: Four times a day (QID) | ORAL | Status: DC | PRN

## 2022-12-05 MED ORDER — CLONIDINE HCL 0.1 MG PO TABS
0.2000 mg | ORAL_TABLET | Freq: Two times a day (BID) | ORAL | Status: DC
  Administered 2022-12-05 – 2022-12-07 (×6): 0.2 mg via ORAL
  Filled 2022-12-05 (×6): qty 2

## 2022-12-05 MED ORDER — AZITHROMYCIN 250 MG PO TABS: 500.0000 mg | ORAL_TABLET | Freq: Every day | ORAL | Status: DC

## 2022-12-05 MED ORDER — FESOTERODINE FUMARATE ER 4 MG PO TB24
4.0000 mg | ORAL_TABLET | Freq: Every day | ORAL | Status: DC
  Administered 2022-12-05 – 2022-12-07 (×3): 4 mg via ORAL
  Filled 2022-12-05 (×3): qty 1

## 2022-12-05 MED ORDER — ARFORMOTEROL TARTRATE 15 MCG/2ML IN NEBU
15.0000 ug | INHALATION_SOLUTION | Freq: Two times a day (BID) | RESPIRATORY_TRACT | Status: DC
  Administered 2022-12-05 – 2022-12-07 (×5): 15 ug via RESPIRATORY_TRACT
  Filled 2022-12-05 (×5): qty 2

## 2022-12-05 MED ORDER — OXYCODONE-ACETAMINOPHEN 5-325 MG PO TABS
1.0000 | ORAL_TABLET | Freq: Four times a day (QID) | ORAL | Status: DC | PRN
  Administered 2022-12-05 – 2022-12-06 (×3): 1 via ORAL
  Filled 2022-12-05 (×3): qty 1

## 2022-12-05 MED ORDER — ATORVASTATIN CALCIUM 10 MG PO TABS
20.0000 mg | ORAL_TABLET | Freq: Every day | ORAL | Status: DC
  Administered 2022-12-05 – 2022-12-07 (×3): 20 mg via ORAL
  Filled 2022-12-05 (×3): qty 2

## 2022-12-05 NOTE — ED Notes (Signed)
Writer was in room with tech Fausto Skillern when she was obtaining a blood sugar. Pt became irate and claimed tech was trying to hurt her. It was explained to pt that the tech has to obtain a blood sugar via finger prick. Pt became very loud and was shouting. Writer asked tech to step out due to pt being very rude to her.

## 2022-12-05 NOTE — H&P (Signed)
History and Physical   [facility] Admission History and Physical [employer], D.O.    Patient Name: April [name] MR#: [id number] Date of Birth: 1970-05-13 Date of Admission: 12/04/2022  Referring MD/NP/PA: Dr. Manus Gunning Primary Care Physician: Hoy Register, MD  Chief Complaint:  Chief Complaint  Patient presents with   Shortness of Breath    HPI: April [name] is a 52 y.o. female with a known history of asthma, COPD, chronic diastolic congestive heart failure, anxiety and depression, GERD, hypertension, morbid obesity, rheumatoid arthritis presents to the emergency department for evaluation of shortness of breath.  Patient has known COPD and heart failure but has not been using her home O2 in several months.  She comes in with progressively worsening shortness of breath for the past 3 days refractory to home nebulizers.  She does have occasional nonproductive cough  Patient denies fevers/chills, weakness, dizziness, chest pain, N/V/C/D, abdominal pain, dysuria/frequency, changes in mental status.    Otherwise there has been no change in status. Patient has been taking medication as prescribed and there has been no recent change in medication or diet.  No recent antibiotics.  There has been no recent illness, hospitalizations, travel or sick contacts.    EMS/ED Course: Patient received Solu-Medrol nebulizers and was placed on 5 L O2 via nasal cannula. Medical admission has been requested for further management of acute on chronic hypoxic respiratory failure secondary to COPD exacerbation.  Review of Systems:  CONSTITUTIONAL: No fever/chills, fatigue, weakness, weight gain/loss, headache. EYES: No blurry or double vision. ENT: No tinnitus, postnasal drip, redness or soreness of the oropharynx. RESPIRATORY: Positive  cough, dyspnea, wheeze.  No hemoptysis.  CARDIOVASCULAR: No chest pain, palpitations, syncope, orthopnea. No lower extremity edema.   GASTROINTESTINAL: No nausea, vomiting, abdominal pain, diarrhea, constipation.  No hematemesis, melena or hematochezia. GENITOURINARY: No dysuria, frequency, hematuria. ENDOCRINE: No polyuria or nocturia. No heat or cold intolerance. HEMATOLOGY: No anemia, bruising, bleeding. INTEGUMENTARY: No rashes, ulcers, lesions. MUSCULOSKELETAL: No arthritis, gout. NEUROLOGIC: No numbness, tingling, ataxia, seizure-type activity, weakness. PSYCHIATRIC: No anxiety, depression, insomnia.   Past Medical History:  Diagnosis Date   Acanthosis nigricans    Anxiety    Arthritis    "knees" (04/28/2017)   Asthma    Followed by Dr. Maple Hudson (pulmonology); receives every other week omalizumab injections; has frequent exacerbations   Back pain    Chronic diastolic CHF (congestive heart failure) (HCC) 01/17/2017   COPD (chronic obstructive pulmonary disease) (HCC)    PFTs in 2002, FEV1/FVC 65, no post bronchodilater test done   Depression    GERD (gastroesophageal reflux disease)    Headache(784.0)    "q couple days" (04/28/2017)   Helicobacter pylori (H. pylori) infection    Hypertension, essential    Insomnia    Joint pain    Lower extremity edema    Menorrhagia    Morbid obesity (HCC)    OSA on CPAP    Sleep study 2008 - mild OSA, not enough events to titrate CPAP; wears CPAP now/pt on 04/28/2017   Pneumonia X 1   Prediabetes    Rheumatoid arthritis (HCC)    Seasonal allergies    Shortness of breath    Tobacco user    Vitamin D deficiency     Past Surgical History:  Procedure Laterality Date   CARDIOVERSION N/A 05/30/2017   Procedure: CARDIOVERSION;  Surgeon: Thurmon Fair, MD;  Location: [facility];  Service: Cardiovascular;  Laterality: N/A;   REDUCTION MAMMAPLASTY Bilateral 09/2011  TUBAL LIGATION  1996   bilateral     reports that she quit smoking about 8 years ago. Her smoking use included cigarettes. She started smoking about 34 years ago. She has a 13 pack-year smoking  history. She has never used smokeless tobacco. She reports that she does not drink alcohol and does not use drugs.  Allergies  Allergen Reactions   Other Shortness Of Breath and Other (See Comments)    Seasonal allergies- Wheezing   Contrast Media [Iodinated Contrast Media] Itching and Other (See Comments)    CT contrast    Family History  Problem Relation Age of Onset   Hypertension Mother    Asthma Daughter    Cancer Paternal Aunt    Asthma Maternal Grandmother     Prior to Admission medications   Medication Sig Start Date End Date Taking? Authorizing Provider  Potassium Chloride ER 20 MEQ TBCR Take 1 tablet by mouth daily. 11/24/22  Yes [provider]  pregabalin (LYRICA) 25 MG capsule Take 25 mg by mouth 3 (three) times daily as needed. 11/12/22  Yes [provider]  albuterol (VENTOLIN HFA) 108 (90 Base) MCG/ACT inhaler INHALE 2 PUFFS INTO THE LUNGS EVERY 6 HOURS AS NEEDED FOR WHEEZING OR SHORTNESS OF BREATH 11/20/22   Jetty Duhamel D, MD  amLODipine (NORVASC) 10 MG tablet Take 1 tablet (10 mg total) by mouth daily. 08/21/22   Hoy Register, MD  apixaban (ELIQUIS) 5 MG TABS tablet Take 1 tablet (5 mg total) by mouth 2 (two) times daily. 08/21/22   Hoy Register, MD  atorvastatin (LIPITOR) 20 MG tablet Take 1 tablet (20 mg total) by mouth daily. 11/19/22   Hoy Register, MD  carvedilol (COREG) 6.25 MG tablet Take 1 tablet (6.25 mg total) by mouth 2 (two) times daily with a meal. TAKE 1 TABLET(6.25 MG) BY MOUTH TWICE DAILY WITH A MEAL Strength: 6.25 mg 08/21/22   Hoy Register, MD  cloNIDine (CATAPRES) 0.2 MG tablet Take 1 tablet (0.2 mg total) by mouth 2 (two) times daily. TAKE 1 TABLET(0.2 MG) BY MOUTH TWICE DAILY Strength: 0.2 mg 08/21/22   Hoy Register, MD  EPINEPHrine 0.3 mg/0.3 mL IJ SOAJ injection Inject 0.3 mg into the muscle as needed for anaphylaxis. 05/30/20   [name], MD  famotidine (PEPCID) 40 MG tablet Take 40 mg by mouth daily in the  afternoon. 01/25/22   [provider]  Fezolinetant (VEOZAH) 45 MG TABS Take 1 tablet (45 mg total) by mouth daily. 12/02/22   Hoy Register, MD  fluticasone (FLONASE) 50 MCG/ACT nasal spray SHAKE LIQUID AND USE 1 SPRAY IN EACH NOSTRIL DAILY AS NEEDED FOR ALLERGIES OR RHINITIS Patient taking differently: Place 1 spray into both nostrils daily. 08/05/21   Hoy Register, MD  Fluticasone-Umeclidin-Vilant (TRELEGY ELLIPTA) 200-62.5-25 MCG/ACT AEPB Inhale 1 puff into the lungs daily. 05/15/22   Glenford Bayley, NP  furosemide (LASIX) 20 MG tablet TAKE 1 TABLET(20 MG) BY MOUTH DAILY 10/08/22   Hoy Register, MD  ipratropium-albuterol (DUONEB) 0.5-2.5 (3) MG/3ML SOLN 1 neb every 6 hours if needed for asthma Patient taking differently: Take 3 mLs by nebulization every 4 (four) hours as needed (shortness of breath). 01/28/22   [name], MD  levothyroxine (SYNTHROID) 50 MCG tablet Take 1 tablet (50 mcg total) by mouth daily. Patient taking differently: Take 50 mcg by mouth daily before breakfast. 11/28/21   Hoy Register, MD  montelukast (SINGULAIR) 10 MG tablet Take 1 tablet (10 mg total) by mouth at  bedtime. 11/28/22   Hoy Register, MD  naloxone [facility]) nasal spray 4 mg/0.1 mL Place 1 spray into the nose once as needed (opioid overdose). 08/09/20   [provider]  oxyCODONE-acetaminophen (PERCOCET) 10-325 MG tablet Take 1 tablet by mouth 5 (five) times daily as needed for pain. Patient taking differently: Take 1 tablet by mouth 4 (four) times daily. 03/26/22     potassium chloride SA (KLOR-CON M) 20 MEQ tablet Take 1 tablet (20 mEq total) by mouth daily. 08/21/22   Hoy Register, MD  Semaglutide, 1 MG/DOSE, (OZEMPIC, 1 MG/DOSE,) 4 MG/3ML SOPN Inject 1 mg into the skin every Monday. 12/02/22   Hoy Register, MD  solifenacin (VESICARE) 5 MG tablet Take 1 tablet (5 mg total) by mouth daily. 08/06/22   Hoy Register, MD  Tezepelumab-ekko (TEZSPIRE) 210 MG/1. SOAJ Inject 210 mg  into the skin every 28 (twenty-eight) days. 11/12/22   Jetty Duhamel D, MD  topiramate (TOPAMAX) 200 MG tablet Take 200 mg by mouth at bedtime. 01/08/21   [provider]  Vitamin D, Ergocalciferol, (DRISDOL) 1.25 MG (50000 UNIT) CAPS capsule Take 50,000 Units by mouth every Friday. 11/30/21   [provider]  zolpidem (AMBIEN) 10 MG tablet TAKE 1 TABLET BY MOUTH AT BEDTIME AS NEEDED FOR SLEEP 08/05/22   Young, Joni Fears D, MD  mometasone-formoterol [facility]) 200-5 MCG/ACT AERO Inhale 2 puffs into the lungs 2 (two) times daily. Dx: Asthma 04/08/19 08/12/19  Jetty Duhamel D, MD    Physical Exam: Vitals:   12/04/22 2153 12/05/22 0055  BP: (!) 173/107 (!) 158/77  Pulse: 93 98  Resp: 20 (!) 23  Temp: 98.4 F (36.9 C) 98.2 F (36.8 C)  TempSrc: Oral Axillary  SpO2: 100% 100%    GENERAL: 52 y.o.-year-old black female patient, morbidly obese lying in the bed in no acute distress.  Pleasant and cooperative.   HEENT: Head atraumatic, normocephalic. Pupils equal. Mucus membranes moist. NECK: Supple. No JVD. CHEST: Prolonged expiratory phase with diffuse audible wheezes without stethoscope.  Some minimal use of accessory muscles no reproducible chest wall tenderness.  CARDIOVASCULAR: S1, S2 normal. No murmurs, rubs, or gallops. Cap refill <2 seconds. Pulses intact distally.  ABDOMEN: Soft, nondistended, nontender. No rebound, guarding, rigidity. Normoactive bowel sounds present in all four quadrants.  EXTREMITIES: No pedal edema, cyanosis, or clubbing. No calf tenderness or Homan's sign.  NEUROLOGIC: The patient is alert and oriented x 3. Cranial nerves II through XII are grossly intact with no focal sensorimotor deficit. PSYCHIATRIC:  Normal affect, mood, thought content. SKIN: Warm, dry, and intact without obvious rash, lesion, or ulcer.    Labs on Admission:  CBC: Recent Labs  Lab 12/04/22 2332  WBC 14.2*  NEUTROABS 7.9*  HGB 11.5*  HCT 37.6  MCV 83.7  PLT 371   Basic  Metabolic Panel: Recent Labs  Lab 12/04/22 2332  NA 136  K 3.6  CL 108  CO2 18*  GLUCOSE 86  BUN 16  CREATININE 1.02*  CALCIUM 8.6*   GFR: Estimated Creatinine Clearance: 94.5 mL/min (A) (by C-G formula based on SCr of 1.02 mg/dL (H)). Liver Function Tests: No results for input(s): "AST", "ALT", "ALKPHOS", "BILITOT", "PROT", "ALBUMIN" in the last 168 hours. No results for input(s): "LIPASE", "AMYLASE" in the last 168 hours. No results for input(s): "AMMONIA" in the last 168 hours. Coagulation Profile: No results for input(s): "INR", "PROTIME" in the last 168 hours. Cardiac Enzymes: No results for input(s): "CKTOTAL", "CKMB", "CKMBINDEX", "TROPONINI" in the last 168 hours.  BNP (last 3 results) No results for input(s): "PROBNP" in the last 8760 hours. HbA1C: No results for input(s): "HGBA1C" in the last 72 hours. CBG: No results for input(s): "GLUCAP" in the last 168 hours. Lipid Profile: No results for input(s): "CHOL", "HDL", "LDLCALC", "TRIG", "CHOLHDL", "LDLDIRECT" in the last 72 hours. Thyroid Function Tests: No results for input(s): "TSH", "T4TOTAL", "FREET4", "T3FREE", "THYROIDAB" in the last 72 hours. Anemia Panel: No results for input(s): "VITAMINB12", "FOLATE", "FERRITIN", "TIBC", "IRON", "RETICCTPCT" in the last 72 hours. Urine analysis:    Component Value Date/Time   COLORURINE YELLOW 10/23/2021 1826   APPEARANCEUR CLOUDY (A) 10/23/2021 1826   LABSPEC 1.027 10/23/2021 1826   PHURINE 5.0 10/23/2021 1826   GLUCOSEU NEGATIVE 10/23/2021 1826   GLUCOSEU NEG mg/dL 16/01/9603 5409   HGBUR NEGATIVE 10/23/2021 1826   BILIRUBINUR NEGATIVE 10/23/2021 1826   KETONESUR NEGATIVE 10/23/2021 1826   PROTEINUR NEGATIVE 10/23/2021 1826   UROBILINOGEN 1.0 11/21/2014 0707   NITRITE NEGATIVE 10/23/2021 1826   LEUKOCYTESUR LARGE (A) 10/23/2021 1826   Sepsis Labs: @LABRCNTIP (procalcitonin:4,lacticidven:4) ) Recent Results (from the past 240 hour(s))  SARS Coronavirus 2 by RT  PCR (hospital order, performed in [facility] hospital lab) *cepheid single result test* Anterior Nasal Swab     Status: None   Collection Time: 12/04/22 10:48 PM   Specimen: Anterior Nasal Swab  Result Value Ref Range Status   SARS Coronavirus 2 by RT PCR NEGATIVE NEGATIVE Final    Comment: (NOTE) SARS-CoV-2 target nucleic acids are NOT DETECTED.  The SARS-CoV-2 RNA is generally detectable in upper and lower respiratory specimens during the acute phase of infection. The lowest concentration of SARS-CoV-2 viral copies this assay can detect is 250 copies / mL. A negative result does not preclude SARS-CoV-2 infection and should not be used as the sole basis for treatment or other patient management decisions.  A negative result may occur with improper specimen collection / handling, submission of specimen other than nasopharyngeal swab, presence of viral mutation(s) within the areas targeted by this assay, and inadequate number of viral copies (<250 copies / mL). A negative result must be combined with clinical observations, patient history, and epidemiological information.  Fact Sheet for Patients:   RoadLapTop.co.za  Fact Sheet for Healthcare Providers: http://kim-miller.com/  This test is not yet approved or  cleared by the [place] FDA and has been authorized for detection and/or diagnosis of SARS-CoV-2 by FDA under an Emergency Use Authorization (EUA).  This EUA will remain in effect (meaning this test can be used) for the duration of the COVID-19 declaration under Section 564(b)(1) of the Act, 21 U.S.C. section 360bbb-3(b)(1), unless the authorization is terminated or revoked sooner.  Performed at [facility], [address], [place], [place] [address]      Radiological Exams on Admission: DG Chest Portable 1 View  Result Date: 12/04/2022 CLINICAL DATA:  Shortness of breath EXAM: PORTABLE CHEST 1 VIEW  COMPARISON:  09/18/2022 FINDINGS: Lungs are clear.  No pleural effusion or pneumothorax. The heart is normal in size. IMPRESSION: No acute cardiopulmonary disease. Electronically Signed   By: Charline Bills M.D.   On: 12/04/2022 22:44    EKG: Normal sinus rhythm at 94 bpm with normal axis and nonspecific ST-T wave changes.   Assessment/Plan  This is a 51 y.o. female with a history of asthma, COPD, chronic diastolic congestive heart failure, anxiety and depression, GERD, hypertension, morbid obesity, rheumatoid arthritis now being admitted with:  #. Acute on chronic hypoxic respiratory  failure secondary to exacerbation of COPD Admit observation - IV steroids and azithromycin - Nebulizers, O2 therapy and expectorants as needed.  -Continue fluticasone, Trelegy, Singulair - Continuous pulse oximetry - Consider pulmonary consult if not improving.  -Social work for assistance with obtaining new home O2 supply  #.  History of CHF -Continue Coreg, atorvastatin, Lasix, potassium  #.  History of anxiety and depression -Monitor and supportive care  #. History of GERD - Continue Pepcid  #. History of hypertension - Continue amlodipine  #. History of hot flashes due to menopause - Continue Fezolinetant clonidine  #. History of hypothyroidism - Continue levothyroxine  Admission status: Observation IV Fluids: Hep-Lock Diet/Nutrition: Heart healthy Consults called: None DVT Px: Lovenox, SCDs and early ambulation. Code Status: Full Code  Disposition Plan: To home in less than 24 hours  All the records are reviewed and case discussed with ED provider. Management plans discussed with the patient and/or family who express understanding and agree with plan of care.    D.O. on 12/05/2022 at 2:29 AM CC: Primary care physician; Hoy Register, MD   12/05/2022, 2:29 AM

## 2022-12-05 NOTE — ED Notes (Signed)
RT at BS.

## 2022-12-05 NOTE — ED Notes (Signed)
ED TO INPATIENT HANDOFF REPORT  ED Nurse Name and Phone #:  Jeanmarie Hubert EMT-Paramedic  S Name/Age/Gender April [name] 52 y.o. female Room/Bed: WA02/WA02  Code Status   Code Status: Full Code  Home/SNF/Other Home Patient oriented to: self, place, time, and situation Is this baseline? Yes   Triage Complete: Triage complete  Chief Complaint COPD exacerbation [facility]) [J44.1]  Triage Note Patient coming to ED for evaluation of shortness of breath.  Reports hx of asthma.  Has tried inhaler without relief.  Symptoms started a "couple of days ago."  C/o chest pain.  Increased RR and wheezing noted in triage. No reports of fever.  Has had mild cough.    Allergies Allergies  Allergen Reactions   Other Shortness Of Breath and Other (See Comments)    Seasonal allergies- Wheezing   Contrast Media [Iodinated Contrast Media] Itching and Other (See Comments)    CT contrast    Level of Care/Admitting Diagnosis ED Disposition     ED Disposition  Admit   Condition  --   Comment  Hospital Area: [facility] [[id number]]  Level of Care: Med-Surg [16]  May admit patient to [facility] or [facility] if equivalent level of care is available:: No  Covid Evaluation: Confirmed COVID Negative  Diagnosis: COPD exacerbation [facility]) [[id number]]  Admitting Physician: Janann Colonel  Attending Physician: Briant Cedar [[id number]]  Certification:: I certify this patient will need inpatient services for at least 2 midnights  Estimated Length of Stay: 2          B Medical/Surgery History Past Medical History:  Diagnosis Date   Acanthosis nigricans    Anxiety    Arthritis    "knees" (04/28/2017)   Asthma    Followed by Dr. Maple Hudson (pulmonology); receives every other week omalizumab injections; has frequent exacerbations   Back pain    Chronic diastolic CHF (congestive heart failure) (HCC) 01/17/2017   COPD (chronic obstructive pulmonary disease) (HCC)     PFTs in 2002, FEV1/FVC 65, no post bronchodilater test done   Depression    GERD (gastroesophageal reflux disease)    Headache(784.0)    "q couple days" (04/28/2017)   Helicobacter pylori (H. pylori) infection    Hypertension, essential    Insomnia    Joint pain    Lower extremity edema    Menorrhagia    Morbid obesity (HCC)    OSA on CPAP    Sleep study 2008 - mild OSA, not enough events to titrate CPAP; wears CPAP now/pt on 04/28/2017   Pneumonia X 1   Prediabetes    Rheumatoid arthritis (HCC)    Seasonal allergies    Shortness of breath    Tobacco user    Vitamin D deficiency    Past Surgical History:  Procedure Laterality Date   CARDIOVERSION N/A 05/30/2017   Procedure: CARDIOVERSION;  Surgeon: Thurmon Fair, MD;  Location: [facility];  Service: Cardiovascular;  Laterality: N/A;   REDUCTION MAMMAPLASTY Bilateral 09/2011   TUBAL LIGATION  1996   bilateral     A IV Location/Drains/Wounds Patient Lines/Drains/Airways Status     Active Line/Drains/Airways     Name Placement date Placement time Site Days   Peripheral IV 12/04/22 20 G 1" Anterior;Distal;Left;Upper Arm 12/04/22  2337  Arm  1            Intake/Output Last 24 hours No intake or output data in the 24 hours ending 12/05/22 1728  Labs/Imaging Results for orders placed or  performed during the hospital encounter of 12/04/22 (from the past 48 hour(s))  SARS Coronavirus 2 by RT PCR (hospital order, performed in [facility] hospital lab) *cepheid single result test* Anterior Nasal Swab     Status: None   Collection Time: 12/04/22 10:48 PM   Specimen: Anterior Nasal Swab  Result Value Ref Range   SARS Coronavirus 2 by RT PCR NEGATIVE NEGATIVE    Comment: (NOTE) SARS-CoV-2 target nucleic acids are NOT DETECTED.  The SARS-CoV-2 RNA is generally detectable in upper and lower respiratory specimens during the acute phase of infection. The lowest concentration of SARS-CoV-2 viral copies this assay can  detect is 250 copies / mL. A negative result does not preclude SARS-CoV-2 infection and should not be used as the sole basis for treatment or other patient management decisions.  A negative result may occur with improper specimen collection / handling, submission of specimen other than nasopharyngeal swab, presence of viral mutation(s) within the areas targeted by this assay, and inadequate number of viral copies (<250 copies / mL). A negative result must be combined with clinical observations, patient history, and epidemiological information.  Fact Sheet for Patients:   RoadLapTop.co.za  Fact Sheet for Healthcare Providers: http://kim-miller.com/  This test is not yet approved or  cleared by the [place] FDA and has been authorized for detection and/or diagnosis of SARS-CoV-2 by FDA under an Emergency Use Authorization (EUA).  This EUA will remain in effect (meaning this test can be used) for the duration of the COVID-19 declaration under Section 564(b)(1) of the Act, 21 U.S.C. section 360bbb-3(b)(1), unless the authorization is terminated or revoked sooner.  Performed at [facility], [address] [place], [place] [address]   CBC with Differential     Status: Abnormal   Collection Time: 12/04/22 11:32 PM  Result Value Ref Range   WBC 14.2 (H) 4.0 - 10.5 K/uL   RBC 4.49 3.87 - 5.11 MIL/uL   Hemoglobin 11.5 (L) 12.0 - 15.0 g/dL   HCT 13.0 86.5 - 78.4 %   MCV 83.7 80.0 - 100.0 fL   MCH 25.6 (L) 26.0 - 34.0 pg   MCHC 30.6 30.0 - 36.0 g/dL   RDW 69.6 (H) 29.5 - 28.4 %   Platelets 371 150 - 400 K/uL   nRBC 0.0 0.0 - 0.2 %   Neutrophils Relative % 56 %   Neutro Abs 7.9 (H) 1.7 - 7.7 K/uL   Lymphocytes Relative 35 %   Lymphs Abs 4.9 (H) 0.7 - 4.0 K/uL   Monocytes Relative 8 %   Monocytes Absolute 1.1 (H) 0.1 - 1.0 K/uL   Eosinophils Relative 1 %   Eosinophils Absolute 0.2 0.0 - 0.5 K/uL   Basophils  Relative 0 %   Basophils Absolute 0.1 0.0 - 0.1 K/uL   Immature Granulocytes 0 %   Abs Immature Granulocytes 0.05 0.00 - 0.07 K/uL    Comment: Performed at [facility], [address] [place], [place] [address]  Basic metabolic panel     Status: Abnormal   Collection Time: 12/04/22 11:32 PM  Result Value Ref Range   Sodium 136 135 - 145 mmol/L   Potassium 3.6 3.5 - 5.1 mmol/L   Chloride 108 98 - 111 mmol/L   CO2 18 (L) 22 - 32 mmol/L   Glucose, Bld 86 70 - 99 mg/dL    Comment: Glucose reference range applies only to samples taken after fasting for at least 8 hours.   BUN  16 6 - 20 mg/dL   Creatinine, Ser 0.98 (H) 0.44 - 1.00 mg/dL   Calcium 8.6 (L) 8.9 - 10.3 mg/dL   GFR, Estimated >11 >91 mL/min    Comment: (NOTE) Calculated using the CKD-EPI Creatinine Equation (2021)    Anion gap 10 5 - 15    Comment: Performed at [facility], [address] [place], [place] [address]  Blood culture (routine x 2)     Status: None (Preliminary result)   Collection Time: 12/04/22 11:32 PM   Specimen: BLOOD RIGHT ARM  Result Value Ref Range   Specimen Description      BLOOD RIGHT ARM Performed at [facility], [address] [place], [place] [address]    Special Requests      BOTTLES DRAWN AEROBIC AND ANAEROBIC Blood Culture adequate volume Performed at [facility], [address] [place], [place] [address]    Culture      NO GROWTH < 12 HOURS Performed at [facility], [address], [place], [place] [address]    Report Status PENDING   Blood gas, venous     Status: Abnormal   Collection Time: 12/04/22 11:32 PM  Result Value Ref Range   pH, Ven 7.3 7.25 - 7.43   pCO2, Ven 45 44 - 60 mmHg   pO2, Ven <31 (LL) 32 - 45 mmHg    Comment: CRITICAL RESULT CALLED TO, READ BACK BY AND VERIFIED WITH: Lambert Keto RN @ 0013 ON 12/05/2022 BY ABDULHALIM,M    Bicarbonate 22.1 20.0 - 28.0 mmol/L   Acid-base deficit  4.4 (H) 0.0 - 2.0 mmol/L   O2 Saturation 36.9 %   Patient temperature 37.0    Drawn by [id number]     Comment: Performed at [facility], [address], [place], [place] [address]  Brain natriuretic peptide     Status: None   Collection Time: 12/04/22 11:32 PM  Result Value Ref Range   B Natriuretic Peptide 68.9 0.0 - 100.0 pg/mL    Comment: Performed at [facility], [address], [place], [place] [address]  Comprehensive metabolic panel     Status: Abnormal   Collection Time: 12/05/22  5:57 AM  Result Value Ref Range   Sodium 137 135 - 145 mmol/L   Potassium 3.3 (L) 3.5 - 5.1 mmol/L   Chloride 112 (H) 98 - 111 mmol/L   CO2 16 (L) 22 - 32 mmol/L   Glucose, Bld 199 (H) 70 - 99 mg/dL    Comment: Glucose reference range applies only to samples taken after fasting for at least 8 hours.   BUN 19 6 - 20 mg/dL   Creatinine, Ser 7.25 0.44 - 1.00 mg/dL   Calcium 8.5 (L) 8.9 - 10.3 mg/dL   Total Protein 7.1 6.5 - 8.1 g/dL   Albumin 3.4 (L) 3.5 - 5.0 g/dL   AST 14 (L) 15 - 41 U/L   ALT 13 0 - 44 U/L   Alkaline Phosphatase 68 38 - 126 U/L   Total Bilirubin 0.1 (L) 0.3 - 1.2 mg/dL   GFR, Estimated >36 >64 mL/min    Comment: (NOTE) Calculated using the CKD-EPI Creatinine Equation (2021)    Anion gap 9 5 - 15    Comment: Performed at [facility], [address], [place], [place] [address]  CBC     Status: Abnormal   Collection Time: 12/05/22  5:57 AM  Result Value Ref  Range   WBC 14.7 (H) 4.0 - 10.5 K/uL   RBC 4.15 3.87 - 5.11 MIL/uL   Hemoglobin 10.9 (L) 12.0 - 15.0 g/dL   HCT 16.1 (L) 09.6 - 04.5 %   MCV 83.4 80.0 - 100.0 fL   MCH 26.3 26.0 - 34.0 pg   MCHC 31.5 30.0 - 36.0 g/dL   RDW 40.9 (H) 81.1 - 91.4 %   Platelets 315 150 - 400 K/uL   nRBC 0.0 0.0 - 0.2 %    Comment: Performed at [facility], [address], [place], [place] [address]  CBG monitoring, ED     Status: Abnormal   Collection Time:  12/05/22  7:55 AM  Result Value Ref Range   Glucose-Capillary 171 (H) 70 - 99 mg/dL    Comment: Glucose reference range applies only to samples taken after fasting for at least 8 hours.  CBG monitoring, ED     Status: Abnormal   Collection Time: 12/05/22  2:01 PM  Result Value Ref Range   Glucose-Capillary 150 (H) 70 - 99 mg/dL    Comment: Glucose reference range applies only to samples taken after fasting for at least 8 hours.   *Note: Due to a large number of results and/or encounters for the requested time period, some results have not been displayed. A complete set of results can be found in Results Review.   DG Chest Portable 1 View  Result Date: 12/04/2022 CLINICAL DATA:  Shortness of breath EXAM: PORTABLE CHEST 1 VIEW COMPARISON:  09/18/2022 FINDINGS: Lungs are clear.  No pleural effusion or pneumothorax. The heart is normal in size. IMPRESSION: No acute cardiopulmonary disease. Electronically Signed   By: Charline Bills M.D.   On: 12/04/2022 22:44    Pending Labs Unresulted Labs (From admission, onward)     Start     Ordered   12/06/22 0500  CBC with Differential/Platelet  Daily,   R      12/05/22 1234   12/06/22 0500  Basic metabolic panel  Daily,   R      12/05/22 1234   12/06/22 0500  Vitamin B12  (Anemia Panel (PNL))  Tomorrow morning,   R        12/05/22 1234   12/06/22 0500  Folate  (Anemia Panel (PNL))  Tomorrow morning,   R        12/05/22 1234   12/06/22 0500  Iron and TIBC  (Anemia Panel (PNL))  Tomorrow morning,   R        12/05/22 1234   12/06/22 0500  Ferritin  (Anemia Panel (PNL))  Tomorrow morning,   R        12/05/22 1234   12/06/22 0500  Hemoglobin A1c  Tomorrow morning,   R        12/05/22 1234   12/05/22 0254  Expectorated Sputum Assessment w Gram Stain, Rflx to Resp Cult  Once,   R        12/05/22 0253   12/04/22 2237  Blood culture (routine x 2)  BLOOD CULTURE X 2,   R      12/04/22 2236            Vitals/Pain Today's Vitals   12/05/22  1030 12/05/22 1130 12/05/22 1400 12/05/22 1417  BP: 99/77 (!) 120/104 (!) 175/81   Pulse: 87 77 84   Resp: 18 (!) 32 (!) 21   Temp: 98.3 F (36.8 C)   98.5 F (36.9 C)  TempSrc: Oral  Oral  SpO2: 100% 100% 100%   PainSc:        Isolation Precautions No active isolations  Medications Medications  amLODipine (NORVASC) tablet 10 mg (10 mg Oral Given 12/05/22 1002)  atorvastatin (LIPITOR) tablet 20 mg (20 mg Oral Given by Other 12/05/22 1113)  carvedilol (COREG) tablet 6.25 mg (6.25 mg Oral Given 12/05/22 0846)  cloNIDine (CATAPRES) tablet 0.2 mg (0.2 mg Oral Given 12/05/22 1002)  furosemide (LASIX) tablet 20 mg (20 mg Oral Given 12/05/22 1002)  Fezolinetant TABS 45 mg (45 mg Oral Not Given 12/05/22 1117)  famotidine (PEPCID) tablet 40 mg (has no administration in time range)  fesoterodine (TOVIAZ) tablet 4 mg (4 mg Oral Given 12/05/22 1113)  apixaban (ELIQUIS) tablet 5 mg (5 mg Oral Given 12/05/22 1002)  topiramate (TOPAMAX) tablet 200 mg (has no administration in time range)  fluticasone (FLONASE) 50 MCG/ACT nasal spray 1 spray (1 spray Each Nare Given 12/05/22 0908)  montelukast (SINGULAIR) tablet 10 mg (has no administration in time range)  sodium chloride flush (NS) 0.9 % injection 3 mL (3 mLs Intravenous Given 12/05/22 1003)  acetaminophen (TYLENOL) tablet 650 mg (has no administration in time range)    Or  acetaminophen (TYLENOL) suppository 650 mg (has no administration in time range)  morphine (PF) 2 MG/ML injection 1 mg (1 mg Intravenous Given 12/05/22 0856)  traZODone (DESYREL) tablet 25 mg (has no administration in time range)  senna-docusate (Senokot-S) tablet 1 tablet (has no administration in time range)  bisacodyl (DULCOLAX) EC tablet 5 mg (has no administration in time range)  ondansetron (ZOFRAN) tablet 4 mg (has no administration in time range)    Or  ondansetron (ZOFRAN) injection 4 mg (has no administration in time range)  hydrALAZINE (APRESOLINE) injection 5 mg (has no  administration in time range)  azithromycin (ZITHROMAX) 500 mg in sodium chloride 0.9 % 250 mL IVPB (500 mg Intravenous Not Given 12/05/22 0307)    Followed by  azithromycin (ZITHROMAX) tablet 500 mg (has no administration in time range)  insulin aspart (novoLOG) injection 0-15 Units (2 Units Subcutaneous Given 12/05/22 1409)  insulin aspart (novoLOG) injection 0-5 Units (has no administration in time range)  oxyCODONE-acetaminophen (PERCOCET/ROXICET) 5-325 MG per tablet 1 tablet (1 tablet Oral Given 12/05/22 0846)    And  oxyCODONE (Oxy IR/ROXICODONE) immediate release tablet 5 mg (5 mg Oral Given 12/05/22 1454)  levothyroxine (SYNTHROID) tablet 50 mcg (50 mcg Oral Given 12/05/22 0630)  methylPREDNISolone sodium succinate (SOLU-MEDROL) 40 mg/mL injection 40 mg (40 mg Intravenous Given 12/05/22 0847)    Followed by  predniSONE (DELTASONE) tablet 40 mg (has no administration in time range)  budesonide (PULMICORT) nebulizer solution 0.25 mg (0.25 mg Nebulization Given 12/05/22 1019)  arformoterol (BROVANA) nebulizer solution 15 mcg (15 mcg Nebulization Given 12/05/22 1019)  albuterol (PROVENTIL) (2.5 MG/3ML) 0.083% nebulizer solution 2.5 mg (has no administration in time range)  ipratropium-albuterol (DUONEB) 0.5-2.5 (3) MG/3ML nebulizer solution 3 mL (3 mLs Nebulization Given 12/05/22 1313)  ipratropium-albuterol (DUONEB) 0.5-2.5 (3) MG/3ML nebulizer solution 9 mL (9 mLs Nebulization Given 12/05/22 0004)  azithromycin (ZITHROMAX) 500 mg in sodium chloride 0.9 % 250 mL IVPB (0 mg Intravenous Stopped 12/05/22 0112)  magnesium sulfate IVPB 2 g 50 mL (0 g Intravenous Stopped 12/05/22 0144)  albuterol (PROVENTIL) (2.5 MG/3ML) 0.083% nebulizer solution (10 mg/hr Nebulization Given 12/05/22 0053)  ipratropium (ATROVENT) nebulizer solution 0.5 mg (0.5 mg Nebulization Given 12/05/22 0053)  oxyCODONE-acetaminophen (PERCOCET/ROXICET) 5-325 MG per tablet 1 tablet (1 tablet Oral Given 12/05/22  0112)  potassium chloride SA (KLOR-CON M)  CR tablet 40 mEq (40 mEq Oral Given 12/05/22 0846)    Mobility walks     Focused Assessments See Chart.    R Recommendations: See Admitting Provider Note  Report given to: Airline pilot

## 2022-12-05 NOTE — ED Notes (Signed)
Ham sandwich provided per pt request.

## 2022-12-05 NOTE — ED Provider Notes (Signed)
Care assumed from Dr. Doran Durand.  Patient persistently wheezing after multiple nebulizers, steroids and magnesium.  Tachypneic.  Chest x-ray is clear.  Will order additional nebulizers and plan admission given her increased respirations and tachypnea.  Discussed with Dr. Volanda Napoleon, Jeannett Senior, MD 12/05/22 [phone]

## 2022-12-05 NOTE — Progress Notes (Signed)
PROGRESS NOTE  April [name] WGN:[id number] DOB: October 13, 1970 DOA: 12/04/2022 PCP: Hoy Register, MD  HPI/Recap of past 24 hours: April [name] is a 52 y.o. female with a known history of asthma, COPD, chronic diastolic congestive heart failure, anxiety and depression, GERD, hypertension, morbid obesity, rheumatoid arthritis presents to the ED for evaluation of worsening SOB for the past couple of days. Patient has known COPD and heart failure but has not been using her home O2 in several months.  She comes in with progressively worsening shortness of breath for the past 3 days refractory to home nebulizer. Reports occasional nonproductive cough, otherwise no new complaints. In the ED, Pt was noted to be tachypneic, BP uncontrolled, otherwise vital signs stable. Labs fairly stable at baseline.  Patient admitted for possible COPD exacerbation.      Today, patient still noted to be wheezing, with some subjective SOB and tachypnea.  Noted to be 100% on room air.  Denies any chest pain, abdominal pain, nausea/vomiting, fever/chills.  Does report bilateral lower extremity neuropathy   Assessment/Plan: Principal Problem:   COPD exacerbation (HCC)   COPD exacerbation Currently saturating well on room air Currently afebrile, with leukocytosis (likely from steroids) Chest x-ray with no acute cardiopulmonary disease Continue IV steroids and azithromycin Continue DuoNeb, Brovana, Pulmicort nebulizers  Continue fluticasone, Singulair Supplemental O2 as needed   Chronic diastolic HF BNP 68, could be falsely low in obese patients Appears euvolemic Continue Coreg, atorvastatin, Lasix  Hypokalemia Replace as needed  Normocytic anemia Hemoglobin currently around baseline Anemia panel pending Daily CBC  Diabetes mellitus type 2, with neuropathy Last A1c was a POC which showed 5.4, will repeat CBGs may be elevated due to current steroid use SSI, Accu-Cheks, hypoglycemic protocol Reports taking  pregabalin 50 mg but was recently increased (will verify with pharmacy) Takes Ozempic PTA  Paroxysmal A-fib Currently normal sinus rhythm Continue Coreg, Eliquis   GERD Continue Pepcid   HTN Continue amlodipine   History of hot flashes due to menopause Continue Fezolinetant clonidine   Hypothyroidism Continue levothyroxine  OSA Does not use CPAP  Morbid obesity Referral to bariatric surgery has been placed by PCP     Estimated body mass index is 49.07 kg/m as calculated from the following:   Height as of 12/02/22: 5\' 6"  (1.676 m).   Weight as of 12/02/22: 137.9 kg.     Code Status: Full  Family Communication: None at bedside  Disposition Plan: Status is: Observation The patient will require care spanning > 2 midnights and should be moved to inpatient because: Level of care      Consultants: None  Procedures: None  Antimicrobials: Azithromycin  DVT prophylaxis: Eliquis   Objective: Vitals:   12/05/22 0858 12/05/22 0930 12/05/22 1020 12/05/22 1030  BP:  (!) 152/86  99/77  Pulse: 83 99  87  Resp:  (!) 23  18  Temp:    98.3 F (36.8 C)  TempSrc:    Oral  SpO2: 100% 100% 98% 100%   No intake or output data in the 24 hours ending 12/05/22 1150 There were no vitals filed for this visit.  Exam: General: NAD, tachypneic Cardiovascular: S1, S2 present Respiratory: Bilateral expiratory wheezing noted Abdomen: Soft, nontender, nondistended, bowel sounds present Musculoskeletal: No bilateral pedal edema noted Skin: Normal Psychiatry: Normal mood     Data Reviewed: CBC: Recent Labs  Lab 12/04/22 2332 12/05/22 0557  WBC 14.2* 14.7*  NEUTROABS 7.9*  --   HGB 11.5* 10.9*  HCT 37.6  34.6*  MCV 83.7 83.4  PLT 371 315   Basic Metabolic Panel: Recent Labs  Lab 12/04/22 2332 12/05/22 0557  NA 136 137  K 3.6 3.3*  CL 108 112*  CO2 18* 16*  GLUCOSE 86 199*  BUN 16 19  CREATININE 1.02* 0.96  CALCIUM 8.6* 8.5*   GFR: Estimated Creatinine  Clearance: 100.4 mL/min (by C-G formula based on SCr of 0.96 mg/dL). Liver Function Tests: Recent Labs  Lab 12/05/22 0557  AST 14*  ALT 13  ALKPHOS 68  BILITOT 0.1*  PROT 7.1  ALBUMIN 3.4*   No results for input(s): "LIPASE", "AMYLASE" in the last 168 hours. No results for input(s): "AMMONIA" in the last 168 hours. Coagulation Profile: No results for input(s): "INR", "PROTIME" in the last 168 hours. Cardiac Enzymes: No results for input(s): "CKTOTAL", "CKMB", "CKMBINDEX", "TROPONINI" in the last 168 hours. BNP (last 3 results) No results for input(s): "PROBNP" in the last 8760 hours. HbA1C: No results for input(s): "HGBA1C" in the last 72 hours. CBG: Recent Labs  Lab 12/05/22 0755  GLUCAP 171*   Lipid Profile: No results for input(s): "CHOL", "HDL", "LDLCALC", "TRIG", "CHOLHDL", "LDLDIRECT" in the last 72 hours. Thyroid Function Tests: No results for input(s): "TSH", "T4TOTAL", "FREET4", "T3FREE", "THYROIDAB" in the last 72 hours. Anemia Panel: No results for input(s): "VITAMINB12", "FOLATE", "FERRITIN", "TIBC", "IRON", "RETICCTPCT" in the last 72 hours. Urine analysis:    Component Value Date/Time   COLORURINE YELLOW 10/23/2021 1826   APPEARANCEUR CLOUDY (A) 10/23/2021 1826   LABSPEC 1.027 10/23/2021 1826   PHURINE 5.0 10/23/2021 1826   GLUCOSEU NEGATIVE 10/23/2021 1826   GLUCOSEU NEG mg/dL 16/01/9603 5409   HGBUR NEGATIVE 10/23/2021 1826   BILIRUBINUR NEGATIVE 10/23/2021 1826   KETONESUR NEGATIVE 10/23/2021 1826   PROTEINUR NEGATIVE 10/23/2021 1826   UROBILINOGEN 1.0 11/21/2014 0707   NITRITE NEGATIVE 10/23/2021 1826   LEUKOCYTESUR LARGE (A) 10/23/2021 1826   Sepsis Labs: @LABRCNTIP (procalcitonin:4,lacticidven:4)  ) Recent Results (from the past 240 hour(s))  SARS Coronavirus 2 by RT PCR (hospital order, performed in [facility] hospital lab) *cepheid single result test* Anterior Nasal Swab     Status: None   Collection Time: 12/04/22 10:48 PM   Specimen:  Anterior Nasal Swab  Result Value Ref Range Status   SARS Coronavirus 2 by RT PCR NEGATIVE NEGATIVE Final    Comment: (NOTE) SARS-CoV-2 target nucleic acids are NOT DETECTED.  The SARS-CoV-2 RNA is generally detectable in upper and lower respiratory specimens during the acute phase of infection. The lowest concentration of SARS-CoV-2 viral copies this assay can detect is 250 copies / mL. A negative result does not preclude SARS-CoV-2 infection and should not be used as the sole basis for treatment or other patient management decisions.  A negative result may occur with improper specimen collection / handling, submission of specimen other than nasopharyngeal swab, presence of viral mutation(s) within the areas targeted by this assay, and inadequate number of viral copies (<250 copies / mL). A negative result must be combined with clinical observations, patient history, and epidemiological information.  Fact Sheet for Patients:   RoadLapTop.co.za  Fact Sheet for Healthcare Providers: http://kim-miller.com/  This test is not yet approved or  cleared by the [place] FDA and has been authorized for detection and/or diagnosis of SARS-CoV-2 by FDA under an Emergency Use Authorization (EUA).  This EUA will remain in effect (meaning this test can be used) for the duration of the COVID-19 declaration under Section 564(b)(1) of the Act, 21  U.S.C. section 360bbb-3(b)(1), unless the authorization is terminated or revoked sooner.  Performed at [facility], [address], [place], [place] [address]   Blood culture (routine x 2)     Status: None (Preliminary result)   Collection Time: 12/04/22 11:32 PM   Specimen: BLOOD RIGHT ARM  Result Value Ref Range Status   Specimen Description   Final    BLOOD RIGHT ARM Performed at [facility], [address], [place], [place] [address]    Special Requests   Final     BOTTLES DRAWN AEROBIC AND ANAEROBIC Blood Culture adequate volume Performed at [facility], [address] [place], [place] [address]    Culture   Final    NO GROWTH < 12 HOURS Performed at [facility], [address] [place], [place] [address]    Report Status PENDING  Incomplete      Studies: DG Chest Portable 1 View  Result Date: 12/04/2022 CLINICAL DATA:  Shortness of breath EXAM: PORTABLE CHEST 1 VIEW COMPARISON:  09/18/2022 FINDINGS: Lungs are clear.  No pleural effusion or pneumothorax. The heart is normal in size. IMPRESSION: No acute cardiopulmonary disease. Electronically Signed   By: Charline Bills M.D.   On: 12/04/2022 22:44    Scheduled Meds:  amLODipine  10 mg Oral Daily   apixaban  5 mg Oral BID   arformoterol  15 mcg Nebulization BID   atorvastatin  20 mg Oral Daily   azithromycin  500 mg Oral QHS   budesonide (PULMICORT) nebulizer solution  0.25 mg Nebulization BID   carvedilol  6.25 mg Oral BID WC   cloNIDine  0.2 mg Oral BID   famotidine  40 mg Oral Q1500   fesoterodine  4 mg Oral Daily   Fezolinetant  45 mg Oral Daily   fluticasone  1 spray Each Nare Daily   furosemide  20 mg Oral Daily   insulin aspart  0-15 Units Subcutaneous TID WC   insulin aspart  0-5 Units Subcutaneous QHS   ipratropium-albuterol  3 mL Nebulization Q6H   levothyroxine  50 mcg Oral Q0600   methylPREDNISolone (SOLU-MEDROL) injection  40 mg Intravenous Q12H   Followed by   Melene Muller ON 12/06/2022] predniSONE  40 mg Oral Q breakfast   montelukast  10 mg Oral QHS   sodium chloride flush  3 mL Intravenous Q12H   topiramate  200 mg Oral QHS    Continuous Infusions:  azithromycin       LOS: 0 days     Briant Cedar, MD [facility]  If 7PM-7AM, please contact night-coverage [email or web address] 12/05/2022, 11:50 AM

## 2022-12-05 NOTE — ED Notes (Signed)
Writer went in Pts room to take blood sugar, pt was very mad and continued to say "I do not know why I have to keep getting my sugar tested If I know damn well I am not no diabetic". I told her that I am just following the doctors orders and we usually have to do before every meal. I told her that I could tell the doctor she was going to refuse but she just ignored it and continued to say "just go ahead and take it". I finished taking it and was proceeding to walk out of the room when she said what was the number, I told her and she got more upset and said there was no way that was correct and she wanted to talk to someone else.

## 2022-12-06 ENCOUNTER — Other Ambulatory Visit (HOSPITAL_COMMUNITY): Payer: Self-pay

## 2022-12-06 ENCOUNTER — Inpatient Hospital Stay (HOSPITAL_COMMUNITY): Payer: 59

## 2022-12-06 DIAGNOSIS — R0609 Other forms of dyspnea: Secondary | ICD-10-CM | POA: Diagnosis not present

## 2022-12-06 DIAGNOSIS — J441 Chronic obstructive pulmonary disease with (acute) exacerbation: Secondary | ICD-10-CM | POA: Diagnosis not present

## 2022-12-06 LAB — ECHOCARDIOGRAM COMPLETE
AV Mean grad: 5 mmHg
AV Peak grad: 9.7 mmHg
Ao pk vel: 1.56 m/s
Area-P 1/2: 3.93 cm2
Height: 65 in
S' Lateral: 3 cm
Weight: 4896 oz

## 2022-12-06 LAB — GLUCOSE, CAPILLARY
Glucose-Capillary: 102 mg/dL — ABNORMAL HIGH (ref 70–99)
Glucose-Capillary: 110 mg/dL — ABNORMAL HIGH (ref 70–99)
Glucose-Capillary: 120 mg/dL — ABNORMAL HIGH (ref 70–99)
Glucose-Capillary: 123 mg/dL — ABNORMAL HIGH (ref 70–99)

## 2022-12-06 MED ORDER — CLONAZEPAM 0.5 MG PO TABS
0.5000 mg | ORAL_TABLET | Freq: Two times a day (BID) | ORAL | Status: DC | PRN
  Administered 2022-12-06: 0.5 mg via ORAL
  Filled 2022-12-06: qty 1

## 2022-12-06 MED ORDER — CLONAZEPAM 0.5 MG PO TABS: 0.5000 mg | ORAL_TABLET | Freq: Two times a day (BID) | ORAL | Status: DC | PRN

## 2022-12-06 MED ORDER — INSULIN ASPART 100 UNIT/ML IJ SOLN: 0.0000 [IU] | Freq: Three times a day (TID) | INTRAMUSCULAR | Status: DC

## 2022-12-06 MED ORDER — VITAMIN B-12 1000 MCG PO TABS
1000.0000 ug | ORAL_TABLET | Freq: Every day | ORAL | Status: DC
  Administered 2022-12-06 – 2022-12-07 (×2): 1000 ug via ORAL
  Filled 2022-12-06 (×2): qty 1

## 2022-12-06 MED ORDER — OXYCODONE-ACETAMINOPHEN 10-325 MG PO TABS: 1.0000 | ORAL_TABLET | Freq: Four times a day (QID) | ORAL | Status: DC | PRN

## 2022-12-06 MED ORDER — OXYCODONE HCL 5 MG PO TABS
5.0000 mg | ORAL_TABLET | Freq: Four times a day (QID) | ORAL | Status: DC | PRN
  Administered 2022-12-06 – 2022-12-07 (×3): 5 mg via ORAL
  Filled 2022-12-06 (×3): qty 1

## 2022-12-06 MED ORDER — OXYCODONE-ACETAMINOPHEN 5-325 MG PO TABS
1.0000 | ORAL_TABLET | Freq: Four times a day (QID) | ORAL | Status: DC | PRN
  Administered 2022-12-06 – 2022-12-07 (×3): 1 via ORAL
  Filled 2022-12-06 (×3): qty 1

## 2022-12-06 NOTE — Progress Notes (Signed)
PROGRESS NOTE  April [name] UJW:[id number] DOB: 09/19/1970 DOA: 12/04/2022 PCP: Hoy Register, MD  HPI/Recap of past 24 hours: April [name] is a 52 y.o. female with a known history of asthma, COPD, chronic diastolic congestive heart failure, anxiety and depression, GERD, hypertension, morbid obesity, rheumatoid arthritis presents to the ED for evaluation of worsening SOB for the past couple of days. Patient has known COPD and heart failure but has not been using her home O2 in several months.  She comes in with progressively worsening shortness of breath for the past 3 days refractory to home nebulizer. Reports occasional nonproductive cough, otherwise no new complaints. In the ED, Pt was noted to be tachypneic, BP uncontrolled, otherwise vital signs stable. Labs fairly stable at baseline.  Patient admitted for possible COPD exacerbation.      Today, pt still noted to be wheezing, but continues to improve. Denies any chest pain. Still tachypenic/SOB   Assessment/Plan: Principal Problem:   COPD exacerbation (HCC)   COPD exacerbation Currently saturating well on room air Currently afebrile, with leukocytosis (likely from steroids) Chest x-ray with no acute cardiopulmonary disease Continue steroids and azithromycin Continue DuoNeb, Brovana, Pulmicort nebulizers  Continue fluticasone, Singulair Supplemental O2 as needed   Chronic diastolic HF BNP 68, could be falsely low in obese patients Appears euvolemic Repeat ECHO unchanged, EF 60-65%, Grade 1 DD Continue Coreg, atorvastatin, Lasix  Hypokalemia Replace as needed  Normocytic anemia Vit B12 def Hemoglobin currently around baseline Anemia panel with ferritin 8, iron 53, Vit B12-->329 Started Vit B12 supplementation  Daily CBC  Diabetes mellitus type 2, with neuropathy A1c 5.4 CBGs may be elevated due to current steroid use SSI, Accu-Cheks, hypoglycemic protocol Reports taking pregabalin 50 mg but was recently increased  (will verify with pharmacy) Takes Ozempic PTA  Paroxysmal A-fib Currently normal sinus rhythm Continue Coreg, Eliquis   GERD Continue Pepcid   HTN Continue amlodipine   History of hot flashes due to menopause Continue Fezolinetant clonidine   Hypothyroidism Continue levothyroxine  OSA Does not use CPAP  Morbid obesity Referral to bariatric surgery has been placed by PCP     Estimated body mass index is 50.92 kg/m as calculated from the following:   Height as of this encounter: 5\' 5"  (1.651 m).   Weight as of this encounter: 138.8 kg.     Code Status: Full  Family Communication: None at bedside  Disposition Plan: Status is: Inpatient The patient will require care spanning > 2 midnights and should be moved to inpatient because: Level of care      Consultants: None  Procedures: None  Antimicrobials: Azithromycin  DVT prophylaxis: Eliquis   Objective: Vitals:   12/06/22 0517 12/06/22 0751 12/06/22 0914 12/06/22 1333  BP: 138/75  (!) 151/81   Pulse: 69  66   Resp: 20     Temp: 98.4 F (36.9 C)     TempSrc:      SpO2: 98% 97%  97%  Weight:      Height:        Intake/Output Summary (Last 24 hours) at 12/06/2022 1741 Last data filed at 12/06/2022 1229 Gross per 24 hour  Intake 10 ml  Output --  Net 10 ml   Filed Weights   12/05/22 1829  Weight: (!) 138.8 kg    Exam: General: NAD, tachypneic Cardiovascular: S1, S2 present Respiratory: Bilateral expiratory wheezing noted Abdomen: Soft, nontender, nondistended, bowel sounds present Musculoskeletal: No bilateral pedal edema noted Skin: Normal Psychiatry: Normal mood  Data Reviewed: CBC: Recent Labs  Lab 12/04/22 2332 12/05/22 0557 12/06/22 0346  WBC 14.2* 14.7* 14.7*  NEUTROABS 7.9*  --  12.3*  HGB 11.5* 10.9* 12.1  HCT 37.6 34.6* 39.3  MCV 83.7 83.4 85.6  PLT 371 315 378   Basic Metabolic Panel: Recent Labs  Lab 12/04/22 2332 12/05/22 0557 12/06/22 0346  NA 136 137  135  K 3.6 3.3* 4.0  CL 108 112* 108  CO2 18* 16* 17*  GLUCOSE 86 199* 115*  BUN 16 19 19   CREATININE 1.02* 0.96 0.90  CALCIUM 8.6* 8.5* 8.7*   GFR: Estimated Creatinine Clearance: 105.9 mL/min (by C-G formula based on SCr of 0.9 mg/dL). Liver Function Tests: Recent Labs  Lab 12/05/22 0557  AST 14*  ALT 13  ALKPHOS 68  BILITOT 0.1*  PROT 7.1  ALBUMIN 3.4*   No results for input(s): "LIPASE", "AMYLASE" in the last 168 hours. No results for input(s): "AMMONIA" in the last 168 hours. Coagulation Profile: No results for input(s): "INR", "PROTIME" in the last 168 hours. Cardiac Enzymes: No results for input(s): "CKTOTAL", "CKMB", "CKMBINDEX", "TROPONINI" in the last 168 hours. BNP (last 3 results) No results for input(s): "PROBNP" in the last 8760 hours. HbA1C: Recent Labs    12/06/22 0346  HGBA1C 5.4   CBG: Recent Labs  Lab 12/05/22 1729 12/05/22 2102 12/06/22 0812 12/06/22 1137 12/06/22 1705  GLUCAP 129* 105* 102* 110* 120*   Lipid Profile: No results for input(s): "CHOL", "HDL", "LDLCALC", "TRIG", "CHOLHDL", "LDLDIRECT" in the last 72 hours. Thyroid Function Tests: No results for input(s): "TSH", "T4TOTAL", "FREET4", "T3FREE", "THYROIDAB" in the last 72 hours. Anemia Panel: Recent Labs    12/06/22 0346  VITAMINB12 329  FOLATE 8.0  FERRITIN 8*  TIBC 503*  IRON 53   Urine analysis:    Component Value Date/Time   COLORURINE YELLOW 10/23/2021 1826   APPEARANCEUR CLOUDY (A) 10/23/2021 1826   LABSPEC 1.027 10/23/2021 1826   PHURINE 5.0 10/23/2021 1826   GLUCOSEU NEGATIVE 10/23/2021 1826   GLUCOSEU NEG mg/dL 47/82/9562 1308   HGBUR NEGATIVE 10/23/2021 1826   BILIRUBINUR NEGATIVE 10/23/2021 1826   KETONESUR NEGATIVE 10/23/2021 1826   PROTEINUR NEGATIVE 10/23/2021 1826   UROBILINOGEN 1.0 11/21/2014 0707   NITRITE NEGATIVE 10/23/2021 1826   LEUKOCYTESUR LARGE (A) 10/23/2021 1826   Sepsis Labs: @LABRCNTIP (procalcitonin:4,lacticidven:4)  ) Recent  Results (from the past 240 hour(s))  SARS Coronavirus 2 by RT PCR (hospital order, performed in [facility] hospital lab) *cepheid single result test* Anterior Nasal Swab     Status: None   Collection Time: 12/04/22 10:48 PM   Specimen: Anterior Nasal Swab  Result Value Ref Range Status   SARS Coronavirus 2 by RT PCR NEGATIVE NEGATIVE Final    Comment: (NOTE) SARS-CoV-2 target nucleic acids are NOT DETECTED.  The SARS-CoV-2 RNA is generally detectable in upper and lower respiratory specimens during the acute phase of infection. The lowest concentration of SARS-CoV-2 viral copies this assay can detect is 250 copies / mL. A negative result does not preclude SARS-CoV-2 infection and should not be used as the sole basis for treatment or other patient management decisions.  A negative result may occur with improper specimen collection / handling, submission of specimen other than nasopharyngeal swab, presence of viral mutation(s) within the areas targeted by this assay, and inadequate number of viral copies (<250 copies / mL). A negative result must be combined with clinical observations, patient history, and epidemiological information.  Fact Sheet for Patients:  RoadLapTop.co.za  Fact Sheet for Healthcare Providers: http://kim-miller.com/  This test is not yet approved or  cleared by the [place] FDA and has been authorized for detection and/or diagnosis of SARS-CoV-2 by FDA under an Emergency Use Authorization (EUA).  This EUA will remain in effect (meaning this test can be used) for the duration of the COVID-19 declaration under Section 564(b)(1) of the Act, 21 U.S.C. section 360bbb-3(b)(1), unless the authorization is terminated or revoked sooner.  Performed at [facility], [address] [place], [place] [address]   Blood culture (routine x 2)     Status: None (Preliminary result)   Collection Time:  12/04/22 11:32 PM   Specimen: BLOOD RIGHT ARM  Result Value Ref Range Status   Specimen Description   Final    BLOOD RIGHT ARM Performed at [facility], [address] [place], [place] [address]    Special Requests   Final    BOTTLES DRAWN AEROBIC AND ANAEROBIC Blood Culture adequate volume Performed at [facility], [address] [place], [place] [address]    Culture   Final    NO GROWTH 1 DAY Performed at [facility], [address] [place], [place] [address]    Report Status PENDING  Incomplete  Blood culture (routine x 2)     Status: None (Preliminary result)   Collection Time: 12/05/22  9:00 PM   Specimen: BLOOD RIGHT ARM  Result Value Ref Range Status   Specimen Description   Final    BLOOD RIGHT ARM Performed at [facility], [address], [place], [place] [address]    Special Requests   Final    BOTTLES DRAWN AEROBIC AND ANAEROBIC Blood Culture results may not be optimal due to an inadequate volume of blood received in culture bottles Performed at [facility], [address] [place], [place] [address]    Culture   Final    NO GROWTH < 12 HOURS Performed at [facility], [address] [place], [place] [address]    Report Status PENDING  Incomplete      Studies: ECHOCARDIOGRAM COMPLETE  Result Date: 12/06/2022    ECHOCARDIOGRAM REPORT   Patient Name:   April [name] Date of Exam: 12/06/2022 Medical Rec #:  [id number]    Height:       65.0 in Accession #:    [id number]   Weight:       306.0 lb Date of Birth:  09-17-1970     BSA:          2.370 m Patient Age:    52 years     BP:           151/81 mmHg Patient Gender: F            HR:           66 bpm. Exam Location:  Inpatient Procedure: 2D Echo, Color Doppler and Cardiac Doppler Indications:    Dyspnea  History:        Patient has prior history of Echocardiogram examinations, most                 recent 10/15/2021. CHF, COPD; Risk Factors:Sleep Apnea,                  Hypertension, Dyslipidemia and Former Smoker.  Sonographer:    Milbert Coulter Referring Phys: [id number] Monica Martinez [facility]  Sonographer Comments: Patient is obese. Image  acquisition challenging due to patient body habitus. IMPRESSIONS  1. Left ventricular ejection fraction, by estimation, is 60 to 65%. The left ventricle has normal function. The left ventricle has no regional wall motion abnormalities. There is mild concentric left ventricular hypertrophy. Left ventricular diastolic parameters are consistent with Grade I diastolic dysfunction (impaired relaxation).  2. Right ventricular systolic function is normal. The right ventricular size is normal. Tricuspid regurgitation signal is inadequate for assessing PA pressure.  3. The mitral valve is grossly normal. No evidence of mitral valve regurgitation. No evidence of mitral stenosis.  4. The aortic valve is tricuspid. Aortic valve regurgitation is not visualized. No aortic stenosis is present.  5. The inferior vena cava is normal in size with greater than 50% respiratory variability, suggesting right atrial pressure of 3 mmHg. Comparison(s): No significant change from prior study. FINDINGS  Left Ventricle: Left ventricular ejection fraction, by estimation, is 60 to 65%. The left ventricle has normal function. The left ventricle has no regional wall motion abnormalities. The left ventricular internal cavity size was normal in size. There is  mild concentric left ventricular hypertrophy. Left ventricular diastolic parameters are consistent with Grade I diastolic dysfunction (impaired relaxation). Right Ventricle: The right ventricular size is normal. No increase in right ventricular wall thickness. Right ventricular systolic function is normal. Tricuspid regurgitation signal is inadequate for assessing PA pressure. Left Atrium: Left atrial size was normal in size. Right Atrium: Right atrial size was normal in size. Pericardium: There is no evidence of  pericardial effusion. Mitral Valve: The mitral valve is grossly normal. No evidence of mitral valve regurgitation. No evidence of mitral valve stenosis. Tricuspid Valve: The tricuspid valve is grossly normal. Tricuspid valve regurgitation is trivial. No evidence of tricuspid stenosis. Aortic Valve: The aortic valve is tricuspid. Aortic valve regurgitation is not visualized. No aortic stenosis is present. Aortic valve mean gradient measures 5.0 mmHg. Aortic valve peak gradient measures 9.7 mmHg. Pulmonic Valve: The pulmonic valve was grossly normal. Pulmonic valve regurgitation is not visualized. No evidence of pulmonic stenosis. Aorta: The aortic root is normal in size and structure. Venous: The inferior vena cava is normal in size with greater than 50% respiratory variability, suggesting right atrial pressure of 3 mmHg. IAS/Shunts: The atrial septum is grossly normal.  LEFT VENTRICLE PLAX 2D LVIDd:         4.40 cm Diastology LVIDs:         3.00 cm LV e' medial:    6.85 cm/s LV PW:         1.30 cm LV E/e' medial:  11.3 LV IVS:        1.40 cm LV e' lateral:   7.29 cm/s                        LV E/e' lateral: 10.6  RIGHT VENTRICLE RV S prime:     16.00 cm/s TAPSE (M-mode): 3.1 cm LEFT ATRIUM             Index        RIGHT ATRIUM           Index LA diam:        3.90 cm 1.65 cm/m   RA Area:     14.40 cm LA Vol (A2C):   46.7 ml 19.70 ml/m  RA Volume:   34.50 ml  14.56 ml/m LA Vol (A4C):   55.0 ml 23.21 ml/m LA Biplane Vol: 53.0 ml 22.36 ml/m  AORTIC VALVE  AV Vmax:           156.00 cm/s AV Vmean:          102.000 cm/s AV VTI:            0.305 m AV Peak Grad:      9.7 mmHg AV Mean Grad:      5.0 mmHg LVOT Vmax:         116.00 cm/s LVOT Vmean:        75.000 cm/s LVOT VTI:          0.239 m LVOT/AV VTI ratio: 0.78  AORTA Ao Root diam: 3.00 cm MITRAL VALVE MV Area (PHT): 3.93 cm    SHUNTS MV Decel Time: 193 msec    Systemic VTI: 0.24 m MV E velocity: 77.10 cm/s MV A velocity: 85.30 cm/s MV E/A ratio:  0.90 Lennie Odor  MD Electronically signed by Lennie Odor MD Signature Date/Time: 12/06/2022/5:02:01 PM    Final     Scheduled Meds:  amLODipine  10 mg Oral Daily   apixaban  5 mg Oral BID   arformoterol  15 mcg Nebulization BID   atorvastatin  20 mg Oral Daily   azithromycin  500 mg Oral QHS   budesonide (PULMICORT) nebulizer solution  0.25 mg Nebulization BID   carvedilol  6.25 mg Oral BID WC   cloNIDine  0.2 mg Oral BID   vitamin B-12  1,000 mcg Oral Daily   famotidine  40 mg Oral Q1500   fesoterodine  4 mg Oral Daily   Fezolinetant  45 mg Oral Daily   fluticasone  1 spray Each Nare Daily   furosemide  20 mg Oral Daily   insulin aspart  0-5 Units Subcutaneous QHS   insulin aspart  0-9 Units Subcutaneous TID WC   ipratropium-albuterol  3 mL Nebulization Q6H WA   levothyroxine  50 mcg Oral Q0600   montelukast  10 mg Oral QHS   predniSONE  40 mg Oral Q breakfast   sodium chloride flush  3 mL Intravenous Q12H   topiramate  200 mg Oral QHS   zolpidem  5 mg Oral QHS    Continuous Infusions:     LOS: 1 day     Briant Cedar, MD [facility]  If 7PM-7AM, please contact night-coverage [email or web address] 12/06/2022, 5:41 PM

## 2022-12-07 DIAGNOSIS — J441 Chronic obstructive pulmonary disease with (acute) exacerbation: Secondary | ICD-10-CM | POA: Diagnosis not present

## 2022-12-07 LAB — GLUCOSE, CAPILLARY
Glucose-Capillary: 91 mg/dL (ref 70–99)
Glucose-Capillary: 86 mg/dL (ref 70–99)

## 2022-12-07 MED ORDER — PREDNISONE 20 MG PO TABS: 40.0000 mg | ORAL_TABLET | Freq: Every day | ORAL | 0 refills | Status: AC

## 2022-12-07 MED ORDER — AZITHROMYCIN 500 MG PO TABS: 500.0000 mg | ORAL_TABLET | Freq: Every day | ORAL | 0 refills | Status: AC

## 2022-12-07 MED ORDER — ALBUTEROL SULFATE HFA 108 (90 BASE) MCG/ACT IN AERS: 2.0000 | INHALATION_SPRAY | Freq: Four times a day (QID) | RESPIRATORY_TRACT | 0 refills | Status: DC | PRN

## 2022-12-07 MED ORDER — CYANOCOBALAMIN 1000 MCG PO TABS: 1000.0000 ug | ORAL_TABLET | Freq: Every day | ORAL | 0 refills | Status: AC

## 2022-12-07 NOTE — Plan of Care (Signed)
  Problem: Education: Goal: Knowledge of disease or condition will improve Outcome: Progressing Goal: Knowledge of the prescribed therapeutic regimen will improve Outcome: Progressing Goal: Individualized Educational Video(s) Outcome: Progressing   Problem: Activity: Goal: Ability to tolerate increased activity will improve Outcome: Progressing Goal: Will verbalize the importance of balancing activity with adequate rest periods Outcome: Progressing   Problem: Respiratory: Goal: Ability to maintain a clear airway will improve Outcome: Progressing Goal: Levels of oxygenation will improve Outcome: Progressing Goal: Ability to maintain adequate ventilation will improve Outcome: Progressing   Problem: Education: Goal: Knowledge of General Education information will improve Description: Including pain rating scale, medication(s)/side effects and non-pharmacologic comfort measures Outcome: Progressing   Problem: Health Behavior/Discharge Planning: Goal: Ability to manage health-related needs will improve Outcome: Progressing   Problem: Clinical Measurements: Goal: Ability to maintain clinical measurements within normal limits will improve Outcome: Progressing Goal: Will remain free from infection Outcome: Progressing Goal: Diagnostic test results will improve Outcome: Progressing Goal: Respiratory complications will improve Outcome: Progressing Goal: Cardiovascular complication will be avoided Outcome: Progressing   Problem: Activity: Goal: Risk for activity intolerance will decrease Outcome: Progressing   Problem: Nutrition: Goal: Adequate nutrition will be maintained Outcome: Progressing   Problem: Coping: Goal: Level of anxiety will decrease Outcome: Progressing   Problem: Elimination: Goal: Will not experience complications related to bowel motility Outcome: Progressing Goal: Will not experience complications related to urinary retention Outcome: Progressing    Problem: Pain Managment: Goal: General experience of comfort will improve Outcome: Progressing   Problem: Safety: Goal: Ability to remain free from injury will improve Outcome: Progressing   Problem: Skin Integrity: Goal: Risk for impaired skin integrity will decrease Outcome: Progressing   Problem: Education: Goal: Ability to describe self-care measures that may prevent or decrease complications (Diabetes Survival Skills Education) will improve Outcome: Progressing Goal: Individualized Educational Video(s) Outcome: Progressing   Problem: Coping: Goal: Ability to adjust to condition or change in health will improve Outcome: Progressing   Problem: Fluid Volume: Goal: Ability to maintain a balanced intake and output will improve Outcome: Progressing   Problem: Health Behavior/Discharge Planning: Goal: Ability to identify and utilize available resources and services will improve Outcome: Progressing Goal: Ability to manage health-related needs will improve Outcome: Progressing   Problem: Metabolic: Goal: Ability to maintain appropriate glucose levels will improve Outcome: Progressing   Problem: Nutritional: Goal: Maintenance of adequate nutrition will improve Outcome: Progressing Goal: Progress toward achieving an optimal weight will improve Outcome: Progressing   Problem: Skin Integrity: Goal: Risk for impaired skin integrity will decrease Outcome: Progressing   Problem: Tissue Perfusion: Goal: Adequacy of tissue perfusion will improve Outcome: Progressing   

## 2022-12-07 NOTE — TOC Initial Note (Signed)
Transition of Care [facility]) - Initial/Assessment Note    Patient Details  Name: April [name] MRN: [id number] Date of Birth: 08-01-1970  Transition of Care [facility]) CM/SW Contact:    Georgie Chard, LCSW Phone Number: 12/07/2022, 11:01 AM  Clinical Narrative:                 CSW spoke with the patient. At this time the patient states that she is in need of DME. This CSW reached out to adopt for patient's DME at this time the patient's INS will not cover as the patient received a toilet seat as well walker in January of last year and October of last year. This CSW explained to the patient that these DME would have to be paid for out of pocket. This CSW did provide reosurces to where the patient can purchase these items. The patient at this time would like HH as she feel like it will be a big help. This CSW has set up the patient with [facility] with REP Cindie. This CSW has informed MD/ and nurse. At this time there are no further TOC needs and patient will DC home at some point this afternoon.   Expected Discharge Plan: Home w Home Health Services Barriers to Discharge: No Barriers Identified   Patient Goals and CMS Choice Patient states their goals for this hospitalization and ongoing recovery are:: Patient would like to get better          Expected Discharge Plan and Services     Post Acute Care Choice: Home Health Living arrangements for the past 2 months: Apartment                           HH Arranged: PT, OT, Nurse's Aide, Social Work [employer] Agency: [facility] Date [facility] Agency Contacted: 12/07/22 Time HH Agency Contacted: 1101 Representative spoke with at [facility] Agency: Cindie  Prior Living Arrangements/Services Living arrangements for the past 2 months: Apartment Lives with:: Self Patient language and need for interpreter reviewed:: No Do you feel safe going back to the place where you live?: Yes      Need for Family Participation in Patient Care: No  (Comment) Care giver support system in place?: Yes (comment) Current home services: Homehealth aide, Home OT, Home PT Criminal Activity/Legal Involvement Pertinent to Current Situation/Hospitalization: No - Comment as needed  Activities of Daily Living Home Assistive Devices/Equipment: Cane (specify quad or straight) ADL Screening (condition at time of admission) Patient's cognitive ability adequate to safely complete daily activities?: Yes Is the patient deaf or have difficulty hearing?: No Does the patient have difficulty seeing, even when wearing glasses/contacts?: No Does the patient have difficulty concentrating, remembering, or making decisions?: No Patient able to express need for assistance with ADLs?: Yes Does the patient have difficulty dressing or bathing?: No Independently performs ADLs?: Yes (appropriate for developmental age) Does the patient have difficulty walking or climbing stairs?: No Weakness of Legs: None Weakness of Arms/Hands: None  Permission Sought/Granted                  Emotional Assessment Appearance:: Appears stated age Attitude/Demeanor/Rapport: Gracious   Orientation: : Oriented to Self, Oriented to Place, Oriented to  Time, Oriented to Situation, Fluctuating Orientation (Suspected and/or reported Sundowners) Alcohol / Substance Use: Not Applicable    Admission diagnosis:  COPD exacerbation (HCC) [J44.1] Patient Active Problem List   Diagnosis Date Noted   COPD  exacerbation (HCC) 12/05/2022   Acute asthma exacerbation 07/19/2022   Primary osteoarthritis of left knee 06/11/2022   Asthma with COPD with exacerbation (HCC) 04/28/2022   Chronic pain syndrome    Hypothyroidism 08/23/2021   Chronic heart failure with preserved ejection fraction (HFpEF) (HCC) 08/23/2021   Primary osteoarthritis of right knee 07/04/2021   Body mass index 50.0-59.9, adult (HCC) 07/04/2021   Painful orthopaedic hardware (HCC) 07/04/2021   Severe persistent asthma  with exacerbation 07/04/2020   Acute respiratory failure (HCC) 02/20/2019   CHF exacerbation (HCC) 02/20/2019   Hot flashes    Asthma-COPD overlap syndrome 10/26/2018   Acute respiratory distress 08/03/2018   Vitamin D deficiency 05/26/2018   Urinary incontinence 11/17/2017   Nausea with vomiting 11/06/2017   Hypomagnesemia 10/27/2017   Dysphagia 10/27/2017   Physical deconditioning 10/25/2017   Acute maxillary sinusitis 10/24/2017   Emesis, persistent 10/08/2017   Volume depletion 10/08/2017   Chest pain 10/08/2017   SOB (shortness of breath) 10/08/2017   Debility 09/22/2017   AKI (acute kidney injury) (HCC) 08/18/2017   HCAP (healthcare-associated pneumonia) 08/06/2017   Acute on chronic respiratory failure with hypoxemia (HCC) 08/05/2017   Chronic atrial fibrillation 07/29/2017   AF (paroxysmal atrial fibrillation) (HCC)    Dyspnea 02/04/2017   Metabolic syndrome 12/06/2016   Moderate persistent asthma with exacerbation 10/19/2016   Hypochromic anemia 10/02/2016   Elevated hemoglobin A1c 05/05/2016   GERD (gastroesophageal reflux disease) 08/30/2015   Anxiety and depression 08/30/2015   Generalized anxiety disorder 08/30/2015   Hyperlipidemia 12/08/2013   Seasonal allergic rhinitis 08/29/2013   Leukocytosis 10/21/2012   Tobacco abuse in remission 10/07/2012   Chronic obstructive pulmonary disease with acute exacerbation (HCC) 05/07/2012   Knee pain, bilateral 04/25/2011   Insomnia 03/14/2011   Obstructive sleep apnea 12/19/2010   Hypokalemia 08/13/2010   Cervical back pain with evidence of disc disease 04/08/2008   HTN (hypertension) 07/31/2006   Morbid obesity due to excess calories (HCC) 06/17/2006   Major depressive disorder, recurrent episode (HCC) 04/10/2006   PCP:  Hoy Register, MD Pharmacy:   [id number] - , [place] - [address] AT [facility] OF [address] [place] [address] Phone:  [phone] Fax: [phone]     Social Determinants of Health (SDOH) Social History: SDOH Screenings   Food Insecurity: No Food Insecurity (12/05/2022)  Housing: Low Risk  (12/05/2022)  Transportation Needs: No Transportation Needs (12/05/2022)  Utilities: Not At Risk (12/05/2022)  Alcohol Screen: Low Risk  (11/20/2022)  Depression (PHQ2-9): Medium Risk (11/20/2022)  Financial Resource Strain: Medium Risk (11/20/2022)  Physical Activity: Insufficiently Active (11/20/2022)  Social Connections: Socially Isolated (11/20/2022)  Stress: Stress Concern Present (11/20/2022)  Tobacco Use: Medium Risk (12/04/2022)  Health Literacy: Adequate Health Literacy (11/20/2022)   SDOH Interventions:     Readmission Risk Interventions    12/07/2022   10:59 AM 07/22/2022   11:56 AM 01/10/2022   11:28 AM  Readmission Risk Prevention Plan  Transportation Screening Complete Complete Complete  Medication Review Oceanographer) Complete Complete Complete  PCP or Specialist appointment within 3-5 days of discharge Complete Complete Complete  HRI or Home Care Consult Complete Complete Complete  SW Recovery Care/Counseling Consult Complete Complete Complete  Palliative Care Screening Not Applicable Not Applicable Not Applicable  Skilled Nursing Facility Not Applicable Not Applicable Not Applicable

## 2022-12-07 NOTE — Evaluation (Signed)
Physical Therapy Evaluation-1x Patient Details Name: April [name] MRN: [id number] DOB: 02-10-1971 Today's Date: 12/07/2022  History of Present Illness  52 yo female admitted with COPD exac. Hx of COPD, morbid obesity, OSA, chronic pain, RA, asthma, depression, falls, anxiety  Clinical Impression  On eval, pt was Mod Ind with mobility. She walked ~150 feet without a device. O2 90% on RA, dyspnea 2/4. Pt tolerated activity well. She is eager to d/c home on today. No acute PT needs. Pt is requesting HHPT f/u. 1x eval. Will sign off.         If plan is discharge home, recommend the following:     Can travel by private vehicle        Equipment Recommendations None recommended by PT  Recommendations for Other Services       Functional Status Assessment Patient has had a recent decline in their functional status and demonstrates the ability to make significant improvements in function in a reasonable and predictable amount of time.     Precautions / Restrictions Restrictions Weight Bearing Restrictions: No      Mobility  Bed Mobility Overal bed mobility: Modified Independent                  Transfers Overall transfer level: Modified independent                      Ambulation/Gait Ambulation/Gait assistance: Modified independent (Device/Increase time) Gait Distance (Feet): 150 Feet Assistive device: None Gait Pattern/deviations: Step-through pattern       General Gait Details: O2 90% on RA, dyspnea 2/4  Stairs            Wheelchair Mobility     Tilt Bed    Modified Rankin (Stroke Patients Only)       Balance                                             Pertinent Vitals/Pain Pain Assessment Pain Assessment: 0-10 Pain Score: 10-Worst pain ever Pain Location: legs Pain Intervention(s): Monitored during session    Home Living Family/patient expects to be discharged to:: Private residence Living Arrangements:  Alone Available Help at Discharge: Friend(s);Available PRN/intermittently Type of Home: Apartment Home Access: Stairs to enter   Entrance Stairs-Number of Steps: 2-3   Home Layout: One level Home Equipment: Agricultural consultant (2 wheels);Wheelchair - manual;Cane - single point Additional Comments: w/c doesn't fit "in areas". Pt somewhat vague about living situation, stated she "stays with someone" but is trying to get her own place    Prior Function Prior Level of Function : Independent/Modified Independent;Driving             Mobility Comments: one fall in past 6 months, had low potassium, friend helped her up ADLs Comments: independent     Extremity/Trunk Assessment   Upper Extremity Assessment Upper Extremity Assessment: Overall WFL for tasks assessed    Lower Extremity Assessment Lower Extremity Assessment: Generalized weakness;RLE deficits/detail RLE Deficits / Details: chronic knee issues-may need replacement per pt    Cervical / Trunk Assessment Cervical / Trunk Assessment: Normal  Communication      Cognition Arousal: Alert Behavior During Therapy: WFL for tasks assessed/performed Overall Cognitive Status: Within Functional Limits for tasks assessed  General Comments      Exercises     Assessment/Plan    PT Assessment All further PT needs can be met in the next venue of care (pt is requesting HHPT f/u)  PT Problem List Decreased strength;Decreased activity tolerance;Decreased balance;Decreased mobility       PT Treatment Interventions      PT Goals (Current goals can be found in the Care Plan section)  Acute Rehab PT Goals Patient Stated Goal: home today PT Goal Formulation: All assessment and education complete, DC therapy    Frequency       Co-evaluation               AM-PAC PT "6 Clicks" Mobility  Outcome Measure Help needed turning from your back to your side while in a flat  bed without using bedrails?: None Help needed moving from lying on your back to sitting on the side of a flat bed without using bedrails?: None Help needed moving to and from a bed to a chair (including a wheelchair)?: None Help needed standing up from a chair using your arms (e.g., wheelchair or bedside chair)?: None Help needed to walk in hospital room?: None Help needed climbing 3-5 steps with a railing? : A Little 6 Click Score: 23    End of Session   Activity Tolerance: Patient tolerated treatment well Patient left: in bed;with call bell/phone within reach   PT Visit Diagnosis: Pain Pain - Right/Left: Right Pain - part of body: Knee    Time: 1212-1220 PT Time Calculation (min) (ACUTE ONLY): 8 min   Charges:   PT Evaluation $PT Eval Low Complexity: 1 Low   PT General Charges $$ ACUTE PT VISIT: 1 Visit           Faye Ramsay, PT Acute Rehabilitation  Office: [phone]

## 2022-12-07 NOTE — TOC Transition Note (Signed)
Transition of Care [facility]) - CM/SW Discharge Note   Patient Details  Name: April [name] MRN: [id number] Date of Birth: 1970/06/02  Transition of Care [facility]) CM/SW Contact:  Georgie Chard, LCSW Phone Number: 12/07/2022, 2:02 PM   Clinical Narrative:    CSW spoke with patient about DME at this time patient will have to self pay see previous CSW note. This CSW set patient up with [facility]. At this time TOC is signing of.    Final next level of care: Home w Home Health Services Barriers to Discharge: No Barriers Identified   Patient Goals and CMS Choice      Discharge Placement                         Discharge Plan and Services Additional resources added to the After Visit Summary for       Post Acute Care Choice: Home Health            DME Agency: [facility] Date DME Agency Contacted: 12/07/22   Representative spoke with at DME Agency: Cindie HH Arranged: PT, OT, Nurse's Aide, Social Work [employer] Agency: [facility] Date [facility] Agency Contacted: 12/07/22 Time HH Agency Contacted: 1101 Representative spoke with at [facility] Agency: Cindie  Social Determinants of Health (SDOH) Interventions SDOH Screenings   Food Insecurity: No Food Insecurity (12/05/2022)  Housing: Low Risk  (12/05/2022)  Transportation Needs: No Transportation Needs (12/05/2022)  Utilities: Not At Risk (12/05/2022)  Alcohol Screen: Low Risk  (11/20/2022)  Depression (PHQ2-9): Medium Risk (11/20/2022)  Financial Resource Strain: Medium Risk (11/20/2022)  Physical Activity: Insufficiently Active (11/20/2022)  Social Connections: Socially Isolated (11/20/2022)  Stress: Stress Concern Present (11/20/2022)  Tobacco Use: Medium Risk (12/04/2022)  Health Literacy: Adequate Health Literacy (11/20/2022)     Readmission Risk Interventions    12/07/2022   10:59 AM 07/22/2022   11:56 AM 01/10/2022   11:28 AM  Readmission Risk Prevention Plan  Transportation Screening Complete Complete Complete   Medication Review Oceanographer) Complete Complete Complete  PCP or Specialist appointment within 3-5 days of discharge Complete Complete Complete  HRI or Home Care Consult Complete Complete Complete  SW Recovery Care/Counseling Consult Complete Complete Complete  Palliative Care Screening Not Applicable Not Applicable Not Applicable  Skilled Nursing Facility Not Applicable Not Applicable Not Applicable

## 2022-12-07 NOTE — Discharge Summary (Signed)
Physician Discharge Summary   Patient: April [name] MRN: [id number] DOB: 06-27-1970  Admit date:     12/04/2022  Discharge date: 12/07/22  Discharge Physician: Briant Cedar   PCP: Hoy Register, MD   Recommendations at discharge:    Follow up with PCP Follow up with pulmonology  Discharge Diagnoses: Principal Problem:   COPD exacerbation [facility])   Hospital Course: [name] is a 52 y.o. female with a known history of asthma, COPD, chronic diastolic congestive heart failure, anxiety and depression, GERD, hypertension, morbid obesity, rheumatoid arthritis presents to the ED for evaluation of worsening SOB for the past couple of days. Patient has known COPD and heart failure but has not been using her home O2 in several months. She comes in with progressively worsening shortness of breath for the past 3 days refractory to home nebulizer. Reports occasional nonproductive cough, otherwise no new complaints. In the ED, Pt was noted to be tachypneic, BP uncontrolled, otherwise vital signs stable. Labs fairly stable at baseline. Patient admitted for possible COPD exacerbation.     Today, pt denies any new complaints. Able to ambulate the hallway w/o any hypoxia or chest pain. Requesting HH needs. TOC arranged HH. Patient eager to be discharged. Advised to follow up with PCP, pulmonology  Assessment and Plan:  COPD exacerbation Currently saturating well on room air Currently afebrile, with leukocytosis (likely from steroids) Chest x-ray with no acute cardiopulmonary disease Discharged on steroids and azithromycin Continue DuoNeb prn, inhalers, fluticasone, Singulair Follow up with PCP, pulmonology   Chronic diastolic HF BNP 68, could be falsely low in obese patients Appears euvolemic Repeat ECHO unchanged, EF 60-65%, Grade 1 DD Continue Coreg, atorvastatin, Lasix   Hypokalemia Replaced as needed   Normocytic anemia Vit B12 def Hemoglobin currently around baseline Anemia  panel with ferritin 8, iron 53, Vit B12-->329 Continue Vit B12 supplementation  Follow up with PCP   Diabetes mellitus type 2, with neuropathy A1c 5.4 CBGs may be elevated due to current steroid use Continue Ozempic PTA   Paroxysmal A-fib Currently normal sinus rhythm Continue Coreg, Eliquis   GERD Continue Pepcid   HTN Continue amlodipine   History of hot flashes due to menopause Continue Fezolinetant clonidine   Hypothyroidism Continue levothyroxine   OSA Does not use CPAP   Morbid obesity Referral to bariatric surgery has been placed by PCP      Pain control - [employer] Controlled Substance Reporting System database was reviewed. and patient was instructed, not to drive, operate heavy machinery, perform activities at heights, swimming or participation in water activities or provide baby-sitting services while on Pain, Sleep and Anxiety Medications; until their outpatient Physician has advised to do so again. Also recommended to not to take more than prescribed Pain, Sleep and Anxiety Medications.    Consultants: None Procedures performed: None Disposition: Home Diet recommendation:  Discharge Diet Orders (From admission, onward)     Start     Ordered   12/07/22 0000  Diet - low sodium heart healthy        12/07/22 1322           Cardiac and Carb modified diet    DISCHARGE MEDICATION: Allergies as of 12/07/2022       Reactions   Other Shortness Of Breath, Other (See Comments)   Seasonal allergies- Wheezing   Contrast Media [iodinated Contrast Media] Itching, Other (See Comments)   CT contrast        Medication List  TAKE these medications    albuterol 108 (90 Base) MCG/ACT inhaler Commonly known as: VENTOLIN HFA Inhale 2 puffs into the lungs every 6 (six) hours as needed for wheezing or shortness of breath. What changed: when to take this   amLODipine 10 MG tablet Commonly known as: NORVASC Take 1 tablet (10 mg total) by mouth  daily.   apixaban 5 MG Tabs tablet Commonly known as: ELIQUIS Take 1 tablet (5 mg total) by mouth 2 (two) times daily.   atorvastatin 20 MG tablet Commonly known as: LIPITOR Take 1 tablet (20 mg total) by mouth daily.   azithromycin 500 MG tablet Commonly known as: Zithromax Take 1 tablet (500 mg total) by mouth daily for 3 days.   carvedilol 6.25 MG tablet Commonly known as: COREG Take 1 tablet (6.25 mg total) by mouth 2 (two) times daily with a meal. TAKE 1 TABLET(6.25 MG) BY MOUTH TWICE DAILY WITH A MEAL Strength: 6.25 mg   clonazePAM 0.5 MG tablet Commonly known as: KLONOPIN Take 0.5 mg by mouth 2 (two) times daily as needed for anxiety.   cloNIDine 0.2 MG tablet Commonly known as: CATAPRES Take 1 tablet (0.2 mg total) by mouth 2 (two) times daily. TAKE 1 TABLET(0.2 MG) BY MOUTH TWICE DAILY Strength: 0.2 mg   cyanocobalamin 1000 MCG tablet Take 1 tablet (1,000 mcg total) by mouth daily. Start taking on: December 08, 2022   EPINEPHrine 0.3 mg/0.3 mL Soaj injection Commonly known as: EPI-PEN Inject 0.3 mg into the muscle as needed for anaphylaxis.   famotidine 40 MG tablet Commonly known as: PEPCID Take 40 mg by mouth daily in the afternoon.   fluticasone 50 MCG/ACT nasal spray Commonly known as: FLONASE SHAKE LIQUID AND USE 1 SPRAY IN EACH NOSTRIL DAILY AS NEEDED FOR ALLERGIES OR RHINITIS What changed: See the new instructions.   furosemide 20 MG tablet Commonly known as: LASIX TAKE 1 TABLET(20 MG) BY MOUTH DAILY   ipratropium-albuterol 0.5-2.5 (3) MG/3ML Soln Commonly known as: DUONEB 1 neb every 6 hours if needed for asthma What changed:  how much to take how to take this when to take this reasons to take this additional instructions   levothyroxine 50 MCG tablet Commonly known as: SYNTHROID Take 1 tablet (50 mcg total) by mouth daily. What changed: when to take this   montelukast 10 MG tablet Commonly known as: SINGULAIR Take 1 tablet (10 mg  total) by mouth at bedtime.   naloxone 4 MG/0.1ML Liqd nasal spray kit Commonly known as: NARCAN Place 1 spray into the nose once as needed (opioid overdose).   oxyCODONE-acetaminophen 10-325 MG tablet Commonly known as: PERCOCET Take 1 tablet by mouth 5 (five) times daily as needed for pain. What changed: when to take this   Ozempic (1 MG/DOSE) 4 MG/3ML Sopn Generic drug: Semaglutide (1 MG/DOSE) Inject 1 mg into the skin every Monday.   Potassium Chloride ER 20 MEQ Tbcr Take 1 tablet by mouth daily.   predniSONE 20 MG tablet Commonly known as: DELTASONE Take 2 tablets (40 mg total) by mouth daily with breakfast for 3 days. Start taking on: December 08, 2022   pregabalin 25 MG capsule Commonly known as: LYRICA Take 25 mg by mouth 3 (three) times daily as needed.   solifenacin 5 MG tablet Commonly known as: VESIcare Take 1 tablet (5 mg total) by mouth daily.   Tezspire 210 MG/1. Soaj Generic drug: Tezepelumab-ekko Inject 210 mg into the skin every 28 (twenty-eight) days.   topiramate 200  MG tablet Commonly known as: TOPAMAX Take 200 mg by mouth at bedtime.   Trelegy Ellipta 200-62.5-25 MCG/ACT Aepb Generic drug: Fluticasone-Umeclidin-Vilant Inhale 1 puff into the lungs daily.   Veozah 45 MG Tabs Generic drug: Fezolinetant Take 1 tablet (45 mg total) by mouth daily.   Vitamin D (Ergocalciferol) 1.25 MG (50000 UNIT) Caps capsule Commonly known as: DRISDOL Take 50,000 Units by mouth every Friday.   zolpidem 10 MG tablet Commonly known as: AMBIEN TAKE 1 TABLET BY MOUTH AT BEDTIME AS NEEDED FOR SLEEP        Follow-up Information     Hoy Register, MD. Schedule an appointment as soon as possible for a visit in 1 week(s).   Specialty: Family Medicine Contact information: [address] [place] [address] [place] [address] [phone]                Discharge Exam: Ceasar Mons Weights   12/05/22 1829  Weight: (!) 138.8 kg   General: NAD   Cardiovascular: S1, S2 present Respiratory: Diminished BS b/l Abdomen: Soft, nontender, obese/nondistended, bowel sounds present Musculoskeletal: No bilateral pedal edema noted Skin: Normal Psychiatry: Normal mood   Condition at discharge: stable  The results of significant diagnostics from this hospitalization (including imaging, microbiology, ancillary and laboratory) are listed below for reference.   Imaging Studies: ECHOCARDIOGRAM COMPLETE  Result Date: 12/06/2022    ECHOCARDIOGRAM REPORT   Patient Name:   April [name] Date of Exam: 12/06/2022 Medical Rec #:  [id number]    Height:       65.0 in Accession #:    [id number]   Weight:       306.0 lb Date of Birth:  03-29-1971     BSA:          2.370 m Patient Age:    52 years     BP:           151/81 mmHg Patient Gender: F            HR:           66 bpm. Exam Location:  Inpatient Procedure: 2D Echo, Color Doppler and Cardiac Doppler Indications:    Dyspnea  History:        Patient has prior history of Echocardiogram examinations, most                 recent 10/15/2021. CHF, COPD; Risk Factors:Sleep Apnea,                 Hypertension, Dyslipidemia and Former Smoker.  Sonographer:    Milbert Coulter Referring Phys: [id number] Monica Martinez [facility]  Sonographer Comments: Patient is obese. Image acquisition challenging due to patient body habitus. IMPRESSIONS  1. Left ventricular ejection fraction, by estimation, is 60 to 65%. The left ventricle has normal function. The left ventricle has no regional wall motion abnormalities. There is mild concentric left ventricular hypertrophy. Left ventricular diastolic parameters are consistent with Grade I diastolic dysfunction (impaired relaxation).  2. Right ventricular systolic function is normal. The right ventricular size is normal. Tricuspid regurgitation signal is inadequate for assessing PA pressure.  3. The mitral valve is grossly normal. No evidence of mitral valve regurgitation. No evidence of mitral stenosis.  4.  The aortic valve is tricuspid. Aortic valve regurgitation is not visualized. No aortic stenosis is present.  5. The inferior vena cava is normal in size with greater than 50% respiratory variability, suggesting right atrial pressure of 3 mmHg. Comparison(s): No significant change from prior  study. FINDINGS  Left Ventricle: Left ventricular ejection fraction, by estimation, is 60 to 65%. The left ventricle has normal function. The left ventricle has no regional wall motion abnormalities. The left ventricular internal cavity size was normal in size. There is  mild concentric left ventricular hypertrophy. Left ventricular diastolic parameters are consistent with Grade I diastolic dysfunction (impaired relaxation). Right Ventricle: The right ventricular size is normal. No increase in right ventricular wall thickness. Right ventricular systolic function is normal. Tricuspid regurgitation signal is inadequate for assessing PA pressure. Left Atrium: Left atrial size was normal in size. Right Atrium: Right atrial size was normal in size. Pericardium: There is no evidence of pericardial effusion. Mitral Valve: The mitral valve is grossly normal. No evidence of mitral valve regurgitation. No evidence of mitral valve stenosis. Tricuspid Valve: The tricuspid valve is grossly normal. Tricuspid valve regurgitation is trivial. No evidence of tricuspid stenosis. Aortic Valve: The aortic valve is tricuspid. Aortic valve regurgitation is not visualized. No aortic stenosis is present. Aortic valve mean gradient measures 5.0 mmHg. Aortic valve peak gradient measures 9.7 mmHg. Pulmonic Valve: The pulmonic valve was grossly normal. Pulmonic valve regurgitation is not visualized. No evidence of pulmonic stenosis. Aorta: The aortic root is normal in size and structure. Venous: The inferior vena cava is normal in size with greater than 50% respiratory variability, suggesting right atrial pressure of 3 mmHg. IAS/Shunts: The atrial septum is  grossly normal.  LEFT VENTRICLE PLAX 2D LVIDd:         4.40 cm Diastology LVIDs:         3.00 cm LV e' medial:    6.85 cm/s LV PW:         1.30 cm LV E/e' medial:  11.3 LV IVS:        1.40 cm LV e' lateral:   7.29 cm/s                        LV E/e' lateral: 10.6  RIGHT VENTRICLE RV S prime:     16.00 cm/s TAPSE (M-mode): 3.1 cm LEFT ATRIUM             Index        RIGHT ATRIUM           Index LA diam:        3.90 cm 1.65 cm/m   RA Area:     14.40 cm LA Vol (A2C):   46.7 ml 19.70 ml/m  RA Volume:   34.50 ml  14.56 ml/m LA Vol (A4C):   55.0 ml 23.21 ml/m LA Biplane Vol: 53.0 ml 22.36 ml/m  AORTIC VALVE AV Vmax:           156.00 cm/s AV Vmean:          102.000 cm/s AV VTI:            0.305 m AV Peak Grad:      9.7 mmHg AV Mean Grad:      5.0 mmHg LVOT Vmax:         116.00 cm/s LVOT Vmean:        75.000 cm/s LVOT VTI:          0.239 m LVOT/AV VTI ratio: 0.78  AORTA Ao Root diam: 3.00 cm MITRAL VALVE MV Area (PHT): 3.93 cm    SHUNTS MV Decel Time: 193 msec    Systemic VTI: 0.24 m MV E velocity: 77.10 cm/s MV A velocity: 85.30 cm/s MV E/A  ratio:  0.90 Lennie Odor MD Electronically signed by Lennie Odor MD Signature Date/Time: 12/06/2022/5:02:01 PM    Final    DG Chest Portable 1 View  Result Date: 12/04/2022 CLINICAL DATA:  Shortness of breath EXAM: PORTABLE CHEST 1 VIEW COMPARISON:  09/18/2022 FINDINGS: Lungs are clear.  No pleural effusion or pneumothorax. The heart is normal in size. IMPRESSION: No acute cardiopulmonary disease. Electronically Signed   By: Charline Bills M.D.   On: 12/04/2022 22:44    Microbiology: Results for orders placed or performed during the hospital encounter of 12/04/22  SARS Coronavirus 2 by RT PCR (hospital order, performed in [facility] hospital lab) *cepheid single result test* Anterior Nasal Swab     Status: None   Collection Time: 12/04/22 10:48 PM   Specimen: Anterior Nasal Swab  Result Value Ref Range Status   SARS Coronavirus 2 by RT PCR NEGATIVE NEGATIVE Final     Comment: (NOTE) SARS-CoV-2 target nucleic acids are NOT DETECTED.  The SARS-CoV-2 RNA is generally detectable in upper and lower respiratory specimens during the acute phase of infection. The lowest concentration of SARS-CoV-2 viral copies this assay can detect is 250 copies / mL. A negative result does not preclude SARS-CoV-2 infection and should not be used as the sole basis for treatment or other patient management decisions.  A negative result may occur with improper specimen collection / handling, submission of specimen other than nasopharyngeal swab, presence of viral mutation(s) within the areas targeted by this assay, and inadequate number of viral copies (<250 copies / mL). A negative result must be combined with clinical observations, patient history, and epidemiological information.  Fact Sheet for Patients:   RoadLapTop.co.za  Fact Sheet for Healthcare Providers: http://kim-miller.com/  This test is not yet approved or  cleared by the [place] FDA and has been authorized for detection and/or diagnosis of SARS-CoV-2 by FDA under an Emergency Use Authorization (EUA).  This EUA will remain in effect (meaning this test can be used) for the duration of the COVID-19 declaration under Section 564(b)(1) of the Act, 21 U.S.C. section 360bbb-3(b)(1), unless the authorization is terminated or revoked sooner.  Performed at [facility], [address] [place], [place] [address]   Blood culture (routine x 2)     Status: None (Preliminary result)   Collection Time: 12/04/22 11:32 PM   Specimen: BLOOD RIGHT ARM  Result Value Ref Range Status   Specimen Description   Final    BLOOD RIGHT ARM Performed at [facility], [address], [place], [place] [address]    Special Requests   Final    BOTTLES DRAWN AEROBIC AND ANAEROBIC Blood Culture adequate volume Performed at [facility],  [address] [place], [place] [address]    Culture   Final    NO GROWTH 2 DAYS Performed at [facility], [address], [place], [place] [address]    Report Status PENDING  Incomplete  Blood culture (routine x 2)     Status: None (Preliminary result)   Collection Time: 12/05/22  9:00 PM   Specimen: BLOOD RIGHT ARM  Result Value Ref Range Status   Specimen Description   Final    BLOOD RIGHT ARM Performed at [facility], [address] [place], [place] [address]    Special Requests   Final    BOTTLES DRAWN AEROBIC AND ANAEROBIC Blood Culture results may not be optimal due to an inadequate volume of blood  received in culture bottles Performed at [facility], [address] [place], [place] [address]    Culture   Final    NO GROWTH 2 DAYS Performed at [facility], [address] [place], [place] [address]    Report Status PENDING  Incomplete   *Note: Due to a large number of results and/or encounters for the requested time period, some results have not been displayed. A complete set of results can be found in Results Review.    Labs: CBC: Recent Labs  Lab 12/04/22 2332 12/05/22 0557 12/06/22 0346 12/07/22 0327  WBC 14.2* 14.7* 14.7* 15.4*  NEUTROABS 7.9*  --  12.3* 8.6*  HGB 11.5* 10.9* 12.1 10.7*  HCT 37.6 34.6* 39.3 34.6*  MCV 83.7 83.4 85.6 84.2  PLT 371 315 378 355   Basic Metabolic Panel: Recent Labs  Lab 12/04/22 2332 12/05/22 0557 12/06/22 0346 12/07/22 0327  NA 136 137 135 138  K 3.6 3.3* 4.0 3.5  CL 108 112* 108 110  CO2 18* 16* 17* 19*  GLUCOSE 86 199* 115* 95  BUN 16 19 19  28*  CREATININE 1.02* 0.96 0.90 0.90  CALCIUM 8.6* 8.5* 8.7* 8.8*   Liver Function Tests: Recent Labs  Lab 12/05/22 0557  AST 14*  ALT 13  ALKPHOS 68  BILITOT 0.1*  PROT 7.1  ALBUMIN 3.4*   CBG: Recent Labs  Lab 12/06/22 1137 12/06/22 1705 12/06/22 2041 12/07/22 0750 12/07/22 1211  GLUCAP 110* 120* 123* 91 86     Discharge time spent: greater than 30 minutes.  Signed: Briant Cedar, MD [facility] 12/07/2022

## 2022-12-07 NOTE — Plan of Care (Signed)
Patient verbalized understanding of discharge instructions. Patient left unit via wheelchair assisted to her car by NT. No sign or symptoms of acute distress at the time of discharge.  Problem: Education: Goal: Knowledge of disease or condition will improve Outcome: Adequate for Discharge Goal: Knowledge of the prescribed therapeutic regimen will improve Outcome: Adequate for Discharge Goal: Individualized Educational Video(s) Outcome: Adequate for Discharge   Problem: Activity: Goal: Ability to tolerate increased activity will improve Outcome: Adequate for Discharge Goal: Will verbalize the importance of balancing activity with adequate rest periods Outcome: Adequate for Discharge   Problem: Respiratory: Goal: Ability to maintain a clear airway will improve Outcome: Adequate for Discharge Goal: Levels of oxygenation will improve Outcome: Adequate for Discharge Goal: Ability to maintain adequate ventilation will improve Outcome: Adequate for Discharge   Problem: Education: Goal: Knowledge of General Education information will improve Description: Including pain rating scale, medication(s)/side effects and non-pharmacologic comfort measures Outcome: Adequate for Discharge   Problem: Health Behavior/Discharge Planning: Goal: Ability to manage health-related needs will improve Outcome: Adequate for Discharge   Problem: Clinical Measurements: Goal: Ability to maintain clinical measurements within normal limits will improve Outcome: Adequate for Discharge Goal: Will remain free from infection Outcome: Adequate for Discharge Goal: Diagnostic test results will improve Outcome: Adequate for Discharge Goal: Respiratory complications will improve Outcome: Adequate for Discharge Goal: Cardiovascular complication will be avoided Outcome: Adequate for Discharge   Problem: Activity: Goal: Risk for activity intolerance will decrease Outcome: Adequate for Discharge   Problem:  Nutrition: Goal: Adequate nutrition will be maintained Outcome: Adequate for Discharge   Problem: Coping: Goal: Level of anxiety will decrease Outcome: Adequate for Discharge   Problem: Elimination: Goal: Will not experience complications related to bowel motility Outcome: Adequate for Discharge Goal: Will not experience complications related to urinary retention Outcome: Adequate for Discharge   Problem: Pain Managment: Goal: General experience of comfort will improve Outcome: Adequate for Discharge   Problem: Safety: Goal: Ability to remain free from injury will improve Outcome: Adequate for Discharge   Problem: Skin Integrity: Goal: Risk for impaired skin integrity will decrease Outcome: Adequate for Discharge   Problem: Education: Goal: Ability to describe self-care measures that may prevent or decrease complications (Diabetes Survival Skills Education) will improve Outcome: Adequate for Discharge Goal: Individualized Educational Video(s) Outcome: Adequate for Discharge   Problem: Coping: Goal: Ability to adjust to condition or change in health will improve Outcome: Adequate for Discharge   Problem: Fluid Volume: Goal: Ability to maintain a balanced intake and output will improve Outcome: Adequate for Discharge   Problem: Health Behavior/Discharge Planning: Goal: Ability to identify and utilize available resources and services will improve Outcome: Adequate for Discharge Goal: Ability to manage health-related needs will improve Outcome: Adequate for Discharge   Problem: Metabolic: Goal: Ability to maintain appropriate glucose levels will improve Outcome: Adequate for Discharge   Problem: Nutritional: Goal: Maintenance of adequate nutrition will improve Outcome: Adequate for Discharge Goal: Progress toward achieving an optimal weight will improve Outcome: Adequate for Discharge   Problem: Skin Integrity: Goal: Risk for impaired skin integrity will  decrease Outcome: Adequate for Discharge   Problem: Tissue Perfusion: Goal: Adequacy of tissue perfusion will improve Outcome: Adequate for Discharge

## 2022-12-09 ENCOUNTER — Telehealth: Payer: Self-pay

## 2022-12-09 ENCOUNTER — Other Ambulatory Visit: Payer: Self-pay

## 2022-12-09 NOTE — Transitions of Care (Post Inpatient/ED Visit) (Signed)
   12/09/2022  Name: April [name] MRN: [id number] DOB: 04-29-1971  Today's TOC FU Call Status: Today's TOC FU Call Status:: Successful TOC FU Call Completed TOC FU Call Complete Date: 12/09/22  Transition Care Management Follow-up Telephone Call Date of Discharge: 12/07/22 Discharge Facility: [facility]) Type of Discharge: Inpatient Admission Primary Inpatient Discharge Diagnosis:: COPD exacerbation How have you been since you were released from the hospital?: Better Any questions or concerns?: No  Items Reviewed: Did you receive and understand the discharge instructions provided?: Yes Medications obtained,verified, and reconciled?: Partial Review Completed Reason for Partial Mediation Review: She said she has all of her medications except the sngulair and vitamin D.  I told her that the singulair should be at her pharmacy as it was ordered 11/28/2022.  She also said that the veozah was not approved by her insurance company.   the patient confirmed that she has a nebulizer and she did not have any questions about the med regime. Any new allergies since your discharge?: No Dietary orders reviewed?: Yes Type of Diet Ordered:: heart healthy Do you have support at home?: Yes People in Home: alone Name of Support/Comfort Primary Source: she is pleased with her new apartment.  Medications Reviewed Today: Medications Reviewed Today   Medications were not reviewed in this encounter     Home Care and Equipment/Supplies: Were Home Health Services Ordered?: Yes Name of Home Health Agency:: [facility] Has Agency set up a time to come to your home?: No EMR reviewed for Home Health Orders:  (I gave the patient the phone number for [facility] and instructed her to call 8/14 if she has not heard from them by then) Any new equipment or medical supplies ordered?: No  Functional Questionnaire: Do you need assistance with bathing/showering or dressing?: No Do you need assistance with meal preparation?:  No Do you need assistance with eating?: No Do you have difficulty maintaining continence: No Do you need assistance with getting out of bed/getting out of a chair/moving?: No (she said sometimes she needs to use a cane when navigating the stairs.   She does not need O2 any longer) Do you have difficulty managing or taking your medications?: No  Follow up appointments reviewed: PCP Follow-up appointment confirmed?: Yes Date of PCP follow-up appointment?: 12/24/22 Follow-up Provider: [facility] Follow-up appointment confirmed?: Yes Date of Specialist follow-up appointment?: 12/31/22 Follow-Up Specialty Provider:: pulmonary Do you need transportation to your follow-up appointment?: No Do you understand care options if your condition(s) worsen?: Yes-patient verbalized understanding    SIGNATURE.Robyne Peers, RN

## 2022-12-09 NOTE — Telephone Encounter (Signed)
From the [facility] call:  She said she is feeling better and she thinks that her anxiety contributed to her recent hospitalization  She said she has all of her medications except the sngulair and vitamin D.  I told her that the singulair should be at her pharmacy as it was ordered 11/28/2022.    She also said that the veozah was not approved by her insurance company.

## 2022-12-10 ENCOUNTER — Other Ambulatory Visit (HOSPITAL_COMMUNITY): Payer: Self-pay

## 2022-12-10 MED ORDER — VENLAFAXINE HCL ER 75 MG PO CP24: 75.0000 mg | ORAL_CAPSULE | Freq: Every day | ORAL | 1 refills | Status: DC

## 2022-12-10 NOTE — Telephone Encounter (Signed)
I called patient and had to leave a message requesting a call back. I want to inform her that per Dr Alvis Lemmings:   Her insurance request that she fails Effexor.  I have sent a prescription for this to her pharmacy and if it does not work she needs to contact me and then we can resubmit PA for [facility].

## 2022-12-10 NOTE — Addendum Note (Signed)
Addended by: Hoy Register on: 12/10/2022 08:45 AM   Modules accepted: Orders

## 2022-12-10 NOTE — Telephone Encounter (Signed)
Her insurance request that she fails Effexor.  I have sent a prescription for this to her pharmacy and if it does not work she needs to contact me and then we can resubmit PA for [facility].

## 2022-12-11 ENCOUNTER — Other Ambulatory Visit: Payer: Self-pay

## 2022-12-12 ENCOUNTER — Other Ambulatory Visit (HOSPITAL_COMMUNITY): Payer: Self-pay

## 2022-12-19 NOTE — Telephone Encounter (Signed)
I called the patient about the effexor and she said that she already received the [facility] from the pharmacy.

## 2022-12-20 ENCOUNTER — Ambulatory Visit: Payer: 59

## 2022-12-20 VITALS — BP 126/64 | HR 76 | Temp 97.7°F | Resp 16 | Ht 65.0 in | Wt 279.2 lb

## 2022-12-20 DIAGNOSIS — J4552 Severe persistent asthma with status asthmaticus: Secondary | ICD-10-CM

## 2022-12-20 DIAGNOSIS — J4489 Other specified chronic obstructive pulmonary disease: Secondary | ICD-10-CM

## 2022-12-20 MED ORDER — TEZEPELUMAB-EKKO 210 MG/1.91ML ~~LOC~~ SOSY
210.0000 mg | PREFILLED_SYRINGE | Freq: Once | SUBCUTANEOUS | Status: AC
  Administered 2022-12-20: 210 mg via SUBCUTANEOUS
  Filled 2022-12-20: qty 1.91

## 2022-12-20 NOTE — Progress Notes (Signed)
Diagnosis: Asthma  Provider:  Chilton Greathouse MD  Procedure: Injection  Tezspire (Tezepelumab), Dose: 210 mg, Site: subcutaneous, Number of injections: 1   Discharge: Condition: Good, Destination: Home . AVS Declined  Performed by:  Nat Math, RN

## 2022-12-23 ENCOUNTER — Other Ambulatory Visit: Payer: Self-pay

## 2022-12-23 DIAGNOSIS — M1712 Unilateral primary osteoarthritis, left knee: Secondary | ICD-10-CM

## 2022-12-23 DIAGNOSIS — M1711 Unilateral primary osteoarthritis, right knee: Secondary | ICD-10-CM

## 2022-12-24 ENCOUNTER — Ambulatory Visit (INDEPENDENT_AMBULATORY_CARE_PROVIDER_SITE_OTHER): Payer: 59 | Admitting: Orthopaedic Surgery

## 2022-12-24 ENCOUNTER — Ambulatory Visit: Payer: 59 | Admitting: Family Medicine

## 2022-12-24 ENCOUNTER — Telehealth (INDEPENDENT_AMBULATORY_CARE_PROVIDER_SITE_OTHER): Payer: Self-pay | Admitting: Family Medicine

## 2022-12-24 DIAGNOSIS — M170 Bilateral primary osteoarthritis of knee: Secondary | ICD-10-CM | POA: Diagnosis not present

## 2022-12-24 DIAGNOSIS — M1712 Unilateral primary osteoarthritis, left knee: Secondary | ICD-10-CM | POA: Diagnosis not present

## 2022-12-24 DIAGNOSIS — M1711 Unilateral primary osteoarthritis, right knee: Secondary | ICD-10-CM | POA: Diagnosis not present

## 2022-12-24 MED ORDER — SODIUM HYALURONATE 60 MG/3ML IX PRSY
60.0000 mg | PREFILLED_SYRINGE | INTRA_ARTICULAR | Status: AC | PRN
Start: 2022-12-24 — End: 2022-12-24
  Administered 2022-12-24: 60 mg via INTRA_ARTICULAR

## 2022-12-24 NOTE — Telephone Encounter (Signed)
Copied from CRM [id number]. Topic: General - Call Back - No Documentation >> Dec 24, 2022  8:35 AM Payton Doughty wrote: Reason for CRM: pt wants Erskine Squibb to know her daughter passed away this morning

## 2022-12-24 NOTE — Telephone Encounter (Signed)
Dr Alvis Lemmings- FYI:  I spoke to the patient and offered condolences and support. She was with her daughter's father and sister.  She said they are not sure what happened and will not have more information until and autopsy is performed.

## 2022-12-24 NOTE — Progress Notes (Signed)
Office Visit Note   Patient: April [name]           Date of Birth: 10/01/1970           MRN: [id number] Visit Date: 12/24/2022              Requested by: April Register, MD [address] [place] [address] [place],  [place] [address] PCP: April Register, MD   Assessment & Plan: Visit Diagnoses:  1. Primary osteoarthritis of right knee   2. Primary osteoarthritis of left knee     Plan: April [name] comes in today for bilateral Durolane injections.  She tolerated these well.  Follow-up as needed.  Follow-Up Instructions: No follow-ups on file.   Orders:  No orders of the defined types were placed in this encounter.  No orders of the defined types were placed in this encounter.     Procedures: Large Joint Inj: bilateral knee on 12/24/2022 3:37 PM Indications: pain Details: 22 G needle  Arthrogram: No  Medications (Right): 60 mg Sodium Hyaluronate 60 MG/3ML Medications (Left): 60 mg Sodium Hyaluronate 60 MG/3ML Outcome: tolerated well, no immediate complications Patient was prepped and draped in the usual sterile fashion.       Clinical Data: No additional findings.   Subjective: Chief Complaint  Patient presents with   Left Knee - Pain   Right Knee - Pain    HPI  Review of Systems   Objective: Vital Signs: LMP 11/06/2022 (Within Days)   Physical Exam  Ortho Exam  Specialty Comments:  No specialty comments available.  Imaging: No results found.   PMFS History: Patient Active Problem List   Diagnosis Date Noted   COPD exacerbation (HCC) 12/05/2022   Acute asthma exacerbation 07/19/2022   Primary osteoarthritis of left knee 06/11/2022   Asthma with COPD with exacerbation (HCC) 04/28/2022   Chronic pain syndrome    Hypothyroidism 08/23/2021   Chronic heart failure with preserved ejection fraction (HFpEF) (HCC) 08/23/2021   Primary osteoarthritis of right knee 07/04/2021   Body mass index 50.0-59.9, adult (HCC) 07/04/2021   Painful orthopaedic  hardware (HCC) 07/04/2021   Severe persistent asthma with exacerbation 07/04/2020   Acute respiratory failure (HCC) 02/20/2019   CHF exacerbation (HCC) 02/20/2019   Hot flashes    Asthma-COPD overlap syndrome 10/26/2018   Acute respiratory distress 08/03/2018   Vitamin D deficiency 05/26/2018   Urinary incontinence 11/17/2017   Nausea with vomiting 11/06/2017   Hypomagnesemia 10/27/2017   Dysphagia 10/27/2017   Physical deconditioning 10/25/2017   Acute maxillary sinusitis 10/24/2017   Emesis, persistent 10/08/2017   Volume depletion 10/08/2017   Chest pain 10/08/2017   SOB (shortness of breath) 10/08/2017   Debility 09/22/2017   AKI (acute kidney injury) (HCC) 08/18/2017   HCAP (healthcare-associated pneumonia) 08/06/2017   Acute on chronic respiratory failure with hypoxemia (HCC) 08/05/2017   Chronic atrial fibrillation 07/29/2017   AF (paroxysmal atrial fibrillation) (HCC)    Dyspnea 02/04/2017   Metabolic syndrome 12/06/2016   Moderate persistent asthma with exacerbation 10/19/2016   Hypochromic anemia 10/02/2016   Elevated hemoglobin A1c 05/05/2016   GERD (gastroesophageal reflux disease) 08/30/2015   Anxiety and depression 08/30/2015   Generalized anxiety disorder 08/30/2015   Hyperlipidemia 12/08/2013   Seasonal allergic rhinitis 08/29/2013   Leukocytosis 10/21/2012   Tobacco abuse in remission 10/07/2012   Chronic obstructive pulmonary disease with acute exacerbation (HCC) 05/07/2012   Knee pain, bilateral 04/25/2011   Insomnia 03/14/2011   Obstructive sleep apnea 12/19/2010   Hypokalemia  08/13/2010   Cervical back pain with evidence of disc disease 04/08/2008   HTN (hypertension) 07/31/2006   Morbid obesity due to excess calories (HCC) 06/17/2006   Major depressive disorder, recurrent episode (HCC) 04/10/2006   Past Medical History:  Diagnosis Date   Acanthosis nigricans    Anxiety    Arthritis    "knees" (04/28/2017)   Asthma    Followed by Dr. Maple Hudson  (pulmonology); receives every other week omalizumab injections; has frequent exacerbations   Back pain    Chronic diastolic CHF (congestive heart failure) (HCC) 01/17/2017   COPD (chronic obstructive pulmonary disease) (HCC)    PFTs in 2002, FEV1/FVC 65, no post bronchodilater test done   Depression    GERD (gastroesophageal reflux disease)    Headache(784.0)    "q couple days" (04/28/2017)   Helicobacter pylori (H. pylori) infection    Hypertension, essential    Insomnia    Joint pain    Lower extremity edema    Menorrhagia    Morbid obesity (HCC)    OSA on CPAP    Sleep study 2008 - mild OSA, not enough events to titrate CPAP; wears CPAP now/pt on 04/28/2017   Pneumonia X 1   Prediabetes    Rheumatoid arthritis (HCC)    Seasonal allergies    Shortness of breath    Tobacco user    Vitamin D deficiency     Family History  Problem Relation Age of Onset   Hypertension Mother    Asthma Daughter    Cancer Paternal Aunt    Asthma Maternal Grandmother     Past Surgical History:  Procedure Laterality Date   CARDIOVERSION N/A 05/30/2017   Procedure: CARDIOVERSION;  Surgeon: Thurmon Fair, MD;  Location: [facility] ENDOSCOPY;  Service: Cardiovascular;  Laterality: N/A;   REDUCTION MAMMAPLASTY Bilateral 09/2011   TUBAL LIGATION  1996   bilateral   Social History   Occupational History   Occupation: stay at home  Tobacco Use   Smoking status: Former    Current packs/day: 0.00    Average packs/day: 0.5 packs/day for 26.0 years (13.0 ttl pk-yrs)    Types: Cigarettes    Start date: 09/11/1988    Quit date: 09/12/2014    Years since quitting: 8.2   Smokeless tobacco: Never   Tobacco comments:    Has vaped before, caught in the hospital  Vaping Use   Vaping status: Never Used  Substance and Sexual Activity   Alcohol use: No   Drug use: No   Sexual activity: Never

## 2022-12-25 NOTE — Telephone Encounter (Signed)
Oh no! That is so sad. Please give her resources for grief Counseling. I gave her a call but had to leave a message. Thanks.

## 2022-12-30 NOTE — Progress Notes (Deleted)
Patient ID: April [name], female    DOB: [birth year], 52 y.o.   MRN: [id number]   HPI F former smoker followed for chronic severe asthma/ Xolair complicated by non-compliance,  OSA/ CPAP,  morbid obesity, HBP, depression, GERD, AFib/ Eliquis  NPSG 03/31/07- AHI 5.8/ hr, weight 330 lbs. She canceled planned more recent study. Office spirometry 10/24/14- severe obstructive airways disease. FVC 2.10/66%, FEV1 1.25/47%, FEV1/FVC 0.59, FEF 25-75 percent 0.64/19% Unattended HST 07/06/16- AHI 11/hour, desaturation to 74%. Office Spirometry 07/29/2016-moderately severe obstructive airways disease. FVC 2.21/68%, FEV1 1.53/57%, ratio 0.69, FEF 25-75% 0.98/34% Prolonged hosp [facility] 4/19- tracheostomy. Dupixent enrolled 10/22/2018 HST 12/26/2018- AHI 9.7/ hr, desaturation to 9.7/ hr, desaturation to 69% NPSG 05/19/22- AHI 0/ hr, desat to 95% Primary Snoring, body weight 315 lbs -------------------------------------------------------   07/09/22- 52 year old female Smoker followed for chronic severe Asthma/ COPD/Xolair, Tracheostomy at [facility] 2019, Insomnia,  complicated by noncompliance, OSA/ oral appliance, morbid Obesity, HTN, depression, GERD Covid infection, PAFib/ Eliquis, dCHF, Dyslipidemia, Depression,  Dupixent enrolled 10/22/2018 changed to [facility] 2023. O2 2L prn -Proair hfa, Trelegy  100, Singulair, Neb Brovana, Tezspire, Ambien 10, Vyvanse, Neb Brovana?, Phentermine, Clonazepam, Prednisone 20 mg daily. Body weight today- 333 lbs Covid vax-2 Phizer Flu vax- [name], NP 05/15/22-hosp f/u after hosp December for As/COPD exacerb. Apopealing to insurance to restart Tezspire, out of meds, had come off CPAP. ED 3/4- Exacerb AsBr> solumedrol, nebs, prednisone ED 3/5- Asthma NPSG 05/19/22- AHI 0/ hr, desat to 95% Primary Snoring, body weight 315 lbs Gets Tezspire injections here- says her hands shake too much for self administration. Fair control of asthma in last couple of weeks, now that she has her  meds. We emphasized calling us before running out. Sheet rock fell down in her home and she complains of mold smell. Steroid talk again. She will try reducing to 20 mg alt with 10 mg every other day.  12/31/22--  52 year old female Smoker followed for chronic severe Asthma/ COPD/Xolair, Tracheostomy at [facility] 2019, Insomnia,  complicated by noncompliance, OSA/ oral appliance, morbid Obesity, HTN, depression, GERD Covid infection, PAFib/ Eliquis, dCHF, Dyslipidemia, Depression,  Dupixent enrolled 10/22/2018 changed to [facility] 2023. O2 2L prn -Proair hfa, Trelegy  100, Singulair, Neb Brovana, Tezspire, Ambien 10, Vyvanse, Neb Brovana?, Phentermine, Clonazepam, Prednisone 20 mg daily. Body weight today-  Hosp 8/7-8/10-  - COPD exacerbation  CXR 87/24  1V- IMPRESSION: No acute cardiopulmonary disease.  Review of Systems-See HPI  + = positive Constitutional:   + weight loss, night sweats, fevers, chills, +fatigue, lassitude. HEENT:   No-  headaches, difficulty swallowing, tooth/dental problems, sore throat,    sneezing, itching, ear ache, +nasal congestion, post nasal drip,  CV:  No-   chest pain, orthopnea, PND, swelling in lower extremities, anasarca, dizziness, palpitations Resp: +shortness of breath with exertion or at rest.              productive cough,  + non-productive cough,  No- coughing up of blood.              No-   change in color of mucus. +wheezing.   Skin: No-   rash or lesions. GI:  No-   heartburn, indigestion, abdominal pain, nausea, vomiting,  GU: MS:  No-   joint pain or swelling.   Neuro-     nothing unusual Psych:  No- change in mood or affect. No depression or anxiety.  No memory loss.    Objective:   Physical Exam    General-  Alert, Oriented, Affect-appropriate, Distress- none acute, + morbid  obesity,  Skin- rash-none, lesions- none, excoriation- none Lymphadenopathy- none Head- atraumatic            Eyes- Gross vision intact, PERRLA, conjunctivae clear  secretions            Ears- Hearing, canals-normal            Nose- Clear, no-Septal dev, mucus, polyps, erosion, perforation.            Throat- Mallampati III , mucosa clear , drainage- none, tonsils- atrophic,                           + Missing  teeth, +upper denture plate is out                  Neck- flexible , trachea+ stoma scar, no stridor , thyroid nl, carotid no bruit Chest - symmetrical excursion , unlabored           Heart/CV- RRR , no murmur , no gallop  , no rub, nl s1 s2                           - JVD- none , edema- none, stasis changes- none, varices- none           Lung- +Diminished, . Wheeze+mild, cough-none,  dullness-none,  rub- none            Chest wall Abd-  Br/ Gen/ Rectal- Not done, not indicated Extrem- cyanosis- none, clubbing, none, atrophy- none, strength- nl Neuro- grossly intact to observation

## 2022-12-31 ENCOUNTER — Ambulatory Visit: Payer: 59 | Admitting: Internal Medicine

## 2022-12-31 NOTE — Telephone Encounter (Signed)
Call placed to patient to check on her and to provide information about grief support if she is interested.  Message left with call back requested.   I want to provide her with the contact number or [facility] grief counseling.: [phone]

## 2023-01-01 ENCOUNTER — Other Ambulatory Visit: Payer: Self-pay

## 2023-01-02 ENCOUNTER — Other Ambulatory Visit (HOSPITAL_COMMUNITY): Payer: Self-pay

## 2023-01-08 NOTE — Telephone Encounter (Signed)
I spoke to the patient and she said she is hanging in there.  They had a memorial today for her daughter who passed away. Another daughter is now staying with her. The patient is interested in grief counseling and asked that I have [facility] call her.  I called [employer] and left a message on the grief support line requesting a call back to myself.

## 2023-01-10 ENCOUNTER — Telehealth: Payer: Self-pay | Admitting: Internal Medicine

## 2023-01-10 MED ORDER — ALBUTEROL SULFATE HFA 108 (90 BASE) MCG/ACT IN AERS: 2.0000 | INHALATION_SPRAY | Freq: Four times a day (QID) | RESPIRATORY_TRACT | 3 refills | Status: DC | PRN

## 2023-01-10 NOTE — Telephone Encounter (Signed)
[facility] on [address]  PT needs a Albuterol refill. Her # is [phone]

## 2023-01-10 NOTE — Telephone Encounter (Signed)
Pt's albuterol inhaler has been sent to preferred pharmacy.

## 2023-01-16 NOTE — Telephone Encounter (Signed)
I called [facility] and had to leave a message requesting a call back.

## 2023-01-17 ENCOUNTER — Ambulatory Visit: Payer: 59

## 2023-01-17 MED ORDER — TEZEPELUMAB-EKKO 210 MG/1.91ML ~~LOC~~ SOSY
210.0000 mg | PREFILLED_SYRINGE | Freq: Once | SUBCUTANEOUS | Status: DC
  Filled 2023-01-17: qty 1.91

## 2023-01-20 ENCOUNTER — Ambulatory Visit (INDEPENDENT_AMBULATORY_CARE_PROVIDER_SITE_OTHER): Payer: 59

## 2023-01-20 ENCOUNTER — Telehealth: Payer: Self-pay

## 2023-01-20 VITALS — BP 160/92 | HR 81 | Temp 98.3°F | Resp 20 | Ht 65.0 in | Wt 319.4 lb

## 2023-01-20 DIAGNOSIS — J4552 Severe persistent asthma with status asthmaticus: Secondary | ICD-10-CM

## 2023-01-20 DIAGNOSIS — J4489 Other specified chronic obstructive pulmonary disease: Secondary | ICD-10-CM

## 2023-01-20 MED ORDER — TEZEPELUMAB-EKKO 210 MG/1.91ML ~~LOC~~ SOSY
210.0000 mg | PREFILLED_SYRINGE | Freq: Once | SUBCUTANEOUS | Status: AC
  Administered 2023-01-20: 210 mg via SUBCUTANEOUS
  Filled 2023-01-20: qty 1.91

## 2023-01-20 NOTE — Progress Notes (Signed)
Diagnosis: Asthma  Provider:  Chilton Greathouse MD  Procedure: Injection  Tezspire (Tezepelumab), Dose: 210 mg, Site: subcutaneous, Number of injections: 1  Administered in right arm.  Post Care: Patient declined observation  Discharge: Condition: Good, Destination: Home . AVS Declined  Performed by:  Wyvonne Lenz, RN

## 2023-01-20 NOTE — Telephone Encounter (Signed)
I spoke to Terri/[employer] who confirmed that they are providing patient's O2

## 2023-01-23 ENCOUNTER — Inpatient Hospital Stay (HOSPITAL_COMMUNITY)
Admission: EM | Admit: 2023-01-23 | Discharge: 2023-01-26 | DRG: 191 | Disposition: A | Discharge status: Home / Self Care | Payer: 59 | Attending: Internal Medicine | Admitting: Internal Medicine

## 2023-01-23 ENCOUNTER — Emergency Department (HOSPITAL_COMMUNITY): Payer: 59

## 2023-01-23 ENCOUNTER — Encounter (HOSPITAL_COMMUNITY): Payer: Self-pay

## 2023-01-23 ENCOUNTER — Telehealth (INDEPENDENT_AMBULATORY_CARE_PROVIDER_SITE_OTHER): Payer: Self-pay | Admitting: Family Medicine

## 2023-01-23 ENCOUNTER — Other Ambulatory Visit: Payer: Self-pay

## 2023-01-23 DIAGNOSIS — M170 Bilateral primary osteoarthritis of knee: Secondary | ICD-10-CM | POA: Diagnosis present

## 2023-01-23 DIAGNOSIS — J4551 Severe persistent asthma with (acute) exacerbation: Secondary | ICD-10-CM | POA: Diagnosis not present

## 2023-01-23 DIAGNOSIS — E119 Type 2 diabetes mellitus without complications: Secondary | ICD-10-CM | POA: Diagnosis present

## 2023-01-23 DIAGNOSIS — Z79899 Other long term (current) drug therapy: Secondary | ICD-10-CM

## 2023-01-23 DIAGNOSIS — E039 Hypothyroidism, unspecified: Secondary | ICD-10-CM | POA: Diagnosis present

## 2023-01-23 DIAGNOSIS — I110 Hypertensive heart disease with heart failure: Secondary | ICD-10-CM | POA: Diagnosis present

## 2023-01-23 DIAGNOSIS — E785 Hyperlipidemia, unspecified: Secondary | ICD-10-CM | POA: Diagnosis present

## 2023-01-23 DIAGNOSIS — J441 Chronic obstructive pulmonary disease with (acute) exacerbation: Secondary | ICD-10-CM | POA: Diagnosis present

## 2023-01-23 DIAGNOSIS — Z87891 Personal history of nicotine dependence: Secondary | ICD-10-CM | POA: Diagnosis not present

## 2023-01-23 DIAGNOSIS — R7309 Other abnormal glucose: Principal | ICD-10-CM

## 2023-01-23 DIAGNOSIS — Z1152 Encounter for screening for COVID-19: Secondary | ICD-10-CM | POA: Diagnosis not present

## 2023-01-23 DIAGNOSIS — E876 Hypokalemia: Secondary | ICD-10-CM | POA: Diagnosis present

## 2023-01-23 DIAGNOSIS — I10 Essential (primary) hypertension: Secondary | ICD-10-CM | POA: Diagnosis not present

## 2023-01-23 DIAGNOSIS — K219 Gastro-esophageal reflux disease without esophagitis: Secondary | ICD-10-CM | POA: Diagnosis present

## 2023-01-23 DIAGNOSIS — E662 Morbid (severe) obesity with alveolar hypoventilation: Secondary | ICD-10-CM | POA: Diagnosis present

## 2023-01-23 DIAGNOSIS — Z8249 Family history of ischemic heart disease and other diseases of the circulatory system: Secondary | ICD-10-CM | POA: Diagnosis not present

## 2023-01-23 DIAGNOSIS — E559 Vitamin D deficiency, unspecified: Secondary | ICD-10-CM | POA: Diagnosis present

## 2023-01-23 DIAGNOSIS — I480 Paroxysmal atrial fibrillation: Secondary | ICD-10-CM | POA: Diagnosis present

## 2023-01-23 DIAGNOSIS — Z825 Family history of asthma and other chronic lower respiratory diseases: Secondary | ICD-10-CM

## 2023-01-23 DIAGNOSIS — Z6841 Body Mass Index (BMI) 40.0 and over, adult: Secondary | ICD-10-CM | POA: Diagnosis not present

## 2023-01-23 DIAGNOSIS — J45901 Unspecified asthma with (acute) exacerbation: Secondary | ICD-10-CM | POA: Diagnosis present

## 2023-01-23 DIAGNOSIS — Z91041 Radiographic dye allergy status: Secondary | ICD-10-CM | POA: Diagnosis not present

## 2023-01-23 DIAGNOSIS — F32A Depression, unspecified: Secondary | ICD-10-CM | POA: Diagnosis present

## 2023-01-23 DIAGNOSIS — M069 Rheumatoid arthritis, unspecified: Secondary | ICD-10-CM | POA: Diagnosis present

## 2023-01-23 DIAGNOSIS — I5032 Chronic diastolic (congestive) heart failure: Secondary | ICD-10-CM | POA: Diagnosis present

## 2023-01-23 DIAGNOSIS — J4541 Moderate persistent asthma with (acute) exacerbation: Secondary | ICD-10-CM | POA: Diagnosis not present

## 2023-01-23 DIAGNOSIS — G8929 Other chronic pain: Secondary | ICD-10-CM | POA: Diagnosis present

## 2023-01-23 DIAGNOSIS — F419 Anxiety disorder, unspecified: Secondary | ICD-10-CM | POA: Diagnosis present

## 2023-01-23 DIAGNOSIS — Z7901 Long term (current) use of anticoagulants: Secondary | ICD-10-CM

## 2023-01-23 DIAGNOSIS — Z7951 Long term (current) use of inhaled steroids: Secondary | ICD-10-CM | POA: Diagnosis not present

## 2023-01-23 DIAGNOSIS — G4733 Obstructive sleep apnea (adult) (pediatric): Secondary | ICD-10-CM | POA: Diagnosis not present

## 2023-01-23 DIAGNOSIS — G4700 Insomnia, unspecified: Secondary | ICD-10-CM | POA: Diagnosis present

## 2023-01-23 DIAGNOSIS — Z79890 Hormone replacement therapy (postmenopausal): Secondary | ICD-10-CM

## 2023-01-23 DIAGNOSIS — M549 Dorsalgia, unspecified: Secondary | ICD-10-CM | POA: Diagnosis present

## 2023-01-23 LAB — BASIC METABOLIC PANEL
Anion gap: 7 (ref 5–15)
BUN: 9 mg/dL (ref 6–20)
CO2: 23 mmol/L (ref 22–32)
Calcium: 8.5 mg/dL — ABNORMAL LOW (ref 8.9–10.3)
Chloride: 107 mmol/L (ref 98–111)
Creatinine, Ser: 0.69 mg/dL (ref 0.44–1.00)
GFR, Estimated: >60 mL/min (ref >60)
Glucose, Bld: 88 mg/dL (ref 70–99)
Potassium: 3.5 mmol/L (ref 3.5–5.1)
Sodium: 137 mmol/L (ref 135–145)

## 2023-01-23 LAB — CBC WITH DIFFERENTIAL/PLATELET
Abs Immature Granulocytes: 0.06 10*3/uL (ref 0.00–0.07)
Basophils Absolute: 0.1 10*3/uL (ref 0.0–0.1)
Basophils Relative: 1 %
Eosinophils Absolute: 0.1 10*3/uL (ref 0.0–0.5)
Eosinophils Relative: 1 %
HCT: 39 % (ref 36.0–46.0)
Hemoglobin: 12 g/dL (ref 12.0–15.0)
Immature Granulocytes: 1 %
Lymphocytes Relative: 40 %
Lymphs Abs: 5.1 10*3/uL — ABNORMAL HIGH (ref 0.7–4.0)
MCH: 26.7 pg (ref 26.0–34.0)
MCHC: 30.8 g/dL (ref 30.0–36.0)
MCV: 86.7 fL (ref 80.0–100.0)
Monocytes Absolute: 1.6 10*3/uL — ABNORMAL HIGH (ref 0.1–1.0)
Monocytes Relative: 13 %
Neutro Abs: 5.8 10*3/uL (ref 1.7–7.7)
Neutrophils Relative %: 44 %
Platelets: 448 10*3/uL — ABNORMAL HIGH (ref 150–400)
RBC: 4.5 MIL/uL (ref 3.87–5.11)
RDW: 19.4 % — ABNORMAL HIGH (ref 11.5–15.5)
WBC: 12.9 10*3/uL — ABNORMAL HIGH (ref 4.0–10.5)
nRBC: 0 % (ref 0.0–0.2)

## 2023-01-23 LAB — SARS CORONAVIRUS 2 BY RT PCR: SARS Coronavirus 2 by RT PCR: NEGATIVE

## 2023-01-23 LAB — BRAIN NATRIURETIC PEPTIDE: B Natriuretic Peptide: 49.5 pg/mL (ref 0.0–100.0)

## 2023-01-23 LAB — PROCALCITONIN: Procalcitonin: <0.1 ng/mL

## 2023-01-23 MED ORDER — ALBUTEROL SULFATE HFA 108 (90 BASE) MCG/ACT IN AERS
2.0000 | INHALATION_SPRAY | RESPIRATORY_TRACT | Status: DC | PRN
  Filled 2023-01-23: qty 6.7

## 2023-01-23 MED ORDER — CARVEDILOL 6.25 MG PO TABS
6.2500 mg | ORAL_TABLET | Freq: Two times a day (BID) | ORAL | Status: DC
  Administered 2023-01-24 – 2023-01-26 (×5): 6.25 mg via ORAL
  Filled 2023-01-23 (×5): qty 1

## 2023-01-23 MED ORDER — ALBUTEROL SULFATE (2.5 MG/3ML) 0.083% IN NEBU
10.0000 mg | INHALATION_SOLUTION | Freq: Once | RESPIRATORY_TRACT | Status: AC
  Administered 2023-01-23: 10 mg via RESPIRATORY_TRACT

## 2023-01-23 MED ORDER — OXYCODONE HCL 5 MG PO TABS
5.0000 mg | ORAL_TABLET | Freq: Four times a day (QID) | ORAL | Status: DC | PRN
  Administered 2023-01-23 – 2023-01-26 (×10): 5 mg via ORAL
  Filled 2023-01-23 (×10): qty 1

## 2023-01-23 MED ORDER — IPRATROPIUM BROMIDE 0.02 % IN SOLN
0.5000 mg | Freq: Once | RESPIRATORY_TRACT | Status: AC
  Administered 2023-01-23: 0.5 mg via RESPIRATORY_TRACT

## 2023-01-23 MED ORDER — FAMOTIDINE 20 MG PO TABS
40.0000 mg | ORAL_TABLET | Freq: Every day | ORAL | Status: DC
  Administered 2023-01-23 – 2023-01-25 (×3): 40 mg via ORAL
  Filled 2023-01-23 (×3): qty 2

## 2023-01-23 MED ORDER — VENLAFAXINE HCL ER 75 MG PO CP24
75.0000 mg | ORAL_CAPSULE | Freq: Every day | ORAL | Status: DC
  Administered 2023-01-24 – 2023-01-26 (×3): 75 mg via ORAL
  Filled 2023-01-23 (×3): qty 1

## 2023-01-23 MED ORDER — ZOLPIDEM TARTRATE 5 MG PO TABS
5.0000 mg | ORAL_TABLET | Freq: Every evening | ORAL | Status: DC | PRN
  Administered 2023-01-23 – 2023-01-25 (×3): 5 mg via ORAL
  Filled 2023-01-23 (×3): qty 1

## 2023-01-23 MED ORDER — POTASSIUM CHLORIDE CRYS ER 10 MEQ PO TBCR
20.0000 meq | EXTENDED_RELEASE_TABLET | Freq: Every day | ORAL | Status: DC
  Administered 2023-01-24 – 2023-01-26 (×3): 20 meq via ORAL
  Filled 2023-01-23 (×3): qty 2

## 2023-01-23 MED ORDER — REVEFENACIN 175 MCG/3ML IN SOLN
175.0000 ug | Freq: Every day | RESPIRATORY_TRACT | Status: DC
  Administered 2023-01-24 – 2023-01-26 (×3): 175 ug via RESPIRATORY_TRACT
  Filled 2023-01-23 (×4): qty 3

## 2023-01-23 MED ORDER — CLONAZEPAM 0.5 MG PO TABS
0.5000 mg | ORAL_TABLET | Freq: Two times a day (BID) | ORAL | Status: DC | PRN
  Administered 2023-01-23 – 2023-01-25 (×3): 0.5 mg via ORAL
  Filled 2023-01-23 (×3): qty 1

## 2023-01-23 MED ORDER — METHYLPREDNISOLONE SODIUM SUCC 125 MG IJ SOLR
125.0000 mg | Freq: Once | INTRAMUSCULAR | Status: AC
  Administered 2023-01-23: 125 mg via INTRAVENOUS
  Filled 2023-01-23: qty 2

## 2023-01-23 MED ORDER — PREGABALIN 25 MG PO CAPS
25.0000 mg | ORAL_CAPSULE | Freq: Two times a day (BID) | ORAL | Status: DC
  Administered 2023-01-23 – 2023-01-26 (×6): 25 mg via ORAL
  Filled 2023-01-23 (×6): qty 1

## 2023-01-23 MED ORDER — PREDNISONE 20 MG PO TABS
40.0000 mg | ORAL_TABLET | Freq: Every day | ORAL | Status: DC
  Administered 2023-01-25 – 2023-01-26 (×2): 40 mg via ORAL
  Filled 2023-01-23 (×2): qty 2

## 2023-01-23 MED ORDER — OXYCODONE-ACETAMINOPHEN 10-325 MG PO TABS: 1.0000 | ORAL_TABLET | Freq: Four times a day (QID) | ORAL | Status: DC | PRN

## 2023-01-23 MED ORDER — IPRATROPIUM-ALBUTEROL 0.5-2.5 (3) MG/3ML IN SOLN
3.0000 mL | RESPIRATORY_TRACT | Status: AC
  Administered 2023-01-23 (×2): 3 mL via RESPIRATORY_TRACT
  Filled 2023-01-23: qty 6

## 2023-01-23 MED ORDER — LEVOTHYROXINE SODIUM 50 MCG PO TABS
50.0000 ug | ORAL_TABLET | Freq: Every day | ORAL | Status: DC
  Administered 2023-01-24 – 2023-01-26 (×3): 50 ug via ORAL
  Filled 2023-01-23 (×3): qty 1

## 2023-01-23 MED ORDER — ACETAMINOPHEN 325 MG PO TABS: 650.0000 mg | ORAL_TABLET | Freq: Four times a day (QID) | ORAL | Status: DC | PRN

## 2023-01-23 MED ORDER — BUDESONIDE 0.5 MG/2ML IN SUSP
2.0000 mg | Freq: Two times a day (BID) | RESPIRATORY_TRACT | Status: DC
  Filled 2023-01-23: qty 8

## 2023-01-23 MED ORDER — GABAPENTIN 300 MG PO CAPS
300.0000 mg | ORAL_CAPSULE | Freq: Three times a day (TID) | ORAL | Status: DC
  Administered 2023-01-23 – 2023-01-26 (×8): 300 mg via ORAL
  Filled 2023-01-23 (×8): qty 1

## 2023-01-23 MED ORDER — SODIUM CHLORIDE 0.9 % IV SOLN
500.0000 mg | INTRAVENOUS | Status: AC
  Administered 2023-01-24: 500 mg via INTRAVENOUS
  Filled 2023-01-23: qty 5

## 2023-01-23 MED ORDER — GUAIFENESIN ER 600 MG PO TB12
600.0000 mg | ORAL_TABLET | Freq: Two times a day (BID) | ORAL | Status: DC
  Administered 2023-01-23 – 2023-01-26 (×6): 600 mg via ORAL
  Filled 2023-01-23 (×6): qty 1

## 2023-01-23 MED ORDER — METHYLPREDNISOLONE SODIUM SUCC 125 MG IJ SOLR
80.0000 mg | Freq: Two times a day (BID) | INTRAMUSCULAR | Status: AC
  Administered 2023-01-24 (×2): 80 mg via INTRAVENOUS
  Filled 2023-01-23 (×2): qty 2

## 2023-01-23 MED ORDER — FEZOLINETANT 45 MG PO TABS: 45.0000 mg | ORAL_TABLET | Freq: Every day | ORAL | Status: DC

## 2023-01-23 MED ORDER — HYDROCOD POLI-CHLORPHE POLI ER 10-8 MG/5ML PO SUER
5.0000 mL | Freq: Two times a day (BID) | ORAL | Status: DC | PRN
  Administered 2023-01-25 – 2023-01-26 (×2): 5 mL via ORAL
  Filled 2023-01-23 (×3): qty 5

## 2023-01-23 MED ORDER — PANTOPRAZOLE SODIUM 40 MG PO TBEC
40.0000 mg | DELAYED_RELEASE_TABLET | Freq: Every day | ORAL | Status: DC
  Administered 2023-01-23 – 2023-01-26 (×4): 40 mg via ORAL
  Filled 2023-01-23 (×4): qty 1

## 2023-01-23 MED ORDER — ALBUTEROL SULFATE (2.5 MG/3ML) 0.083% IN NEBU
2.5000 mg | INHALATION_SOLUTION | RESPIRATORY_TRACT | Status: DC | PRN
  Administered 2023-01-23 – 2023-01-26 (×12): 2.5 mg via RESPIRATORY_TRACT
  Filled 2023-01-23 (×12): qty 3

## 2023-01-23 MED ORDER — SODIUM CHLORIDE 0.9 % IV SOLN
500.0000 mg | Freq: Once | INTRAVENOUS | Status: AC
  Administered 2023-01-23: 500 mg via INTRAVENOUS
  Filled 2023-01-23: qty 5

## 2023-01-23 MED ORDER — ONDANSETRON HCL 4 MG/2ML IJ SOLN: 4.0000 mg | Freq: Four times a day (QID) | INTRAMUSCULAR | Status: DC | PRN

## 2023-01-23 MED ORDER — BUDESONIDE 0.25 MG/2ML IN SUSP
0.2500 mg | Freq: Two times a day (BID) | RESPIRATORY_TRACT | Status: DC
  Administered 2023-01-23 – 2023-01-26 (×6): 0.25 mg via RESPIRATORY_TRACT
  Filled 2023-01-23 (×6): qty 2

## 2023-01-23 MED ORDER — TOPIRAMATE 100 MG PO TABS
200.0000 mg | ORAL_TABLET | Freq: Every day | ORAL | Status: DC
  Administered 2023-01-23 – 2023-01-25 (×3): 200 mg via ORAL
  Filled 2023-01-23 (×3): qty 2

## 2023-01-23 MED ORDER — ZOLPIDEM TARTRATE 5 MG PO TABS: 10.0000 mg | ORAL_TABLET | Freq: Every evening | ORAL | Status: DC | PRN

## 2023-01-23 MED ORDER — ARFORMOTEROL TARTRATE 15 MCG/2ML IN NEBU
15.0000 ug | INHALATION_SOLUTION | Freq: Two times a day (BID) | RESPIRATORY_TRACT | Status: DC
  Administered 2023-01-23 – 2023-01-26 (×6): 15 ug via RESPIRATORY_TRACT
  Filled 2023-01-23 (×6): qty 2

## 2023-01-23 MED ORDER — AMLODIPINE BESYLATE 5 MG PO TABS
10.0000 mg | ORAL_TABLET | Freq: Every day | ORAL | Status: DC
  Administered 2023-01-24 – 2023-01-26 (×3): 10 mg via ORAL
  Filled 2023-01-23 (×3): qty 2

## 2023-01-23 MED ORDER — ATORVASTATIN CALCIUM 20 MG PO TABS
20.0000 mg | ORAL_TABLET | Freq: Every day | ORAL | Status: DC
  Administered 2023-01-24 – 2023-01-26 (×3): 20 mg via ORAL
  Filled 2023-01-23 (×3): qty 1

## 2023-01-23 MED ORDER — ACETAMINOPHEN 650 MG RE SUPP: 650.0000 mg | Freq: Four times a day (QID) | RECTAL | Status: DC | PRN

## 2023-01-23 MED ORDER — MONTELUKAST SODIUM 10 MG PO TABS
10.0000 mg | ORAL_TABLET | Freq: Every day | ORAL | Status: DC
  Administered 2023-01-23 – 2023-01-25 (×3): 10 mg via ORAL
  Filled 2023-01-23 (×4): qty 1

## 2023-01-23 MED ORDER — FUROSEMIDE 20 MG PO TABS
20.0000 mg | ORAL_TABLET | Freq: Every day | ORAL | Status: DC
  Administered 2023-01-24 – 2023-01-26 (×3): 20 mg via ORAL
  Filled 2023-01-23 (×3): qty 1

## 2023-01-23 MED ORDER — ALBUTEROL SULFATE (2.5 MG/3ML) 0.083% IN NEBU
2.5000 mg | INHALATION_SOLUTION | Freq: Once | RESPIRATORY_TRACT | Status: AC
  Administered 2023-01-23: 2.5 mg via RESPIRATORY_TRACT
  Filled 2023-01-23: qty 3

## 2023-01-23 MED ORDER — AZITHROMYCIN 500 MG PO TABS: 500.0000 mg | ORAL_TABLET | Freq: Every day | ORAL | Status: DC

## 2023-01-23 MED ORDER — ONDANSETRON HCL 4 MG PO TABS: 4.0000 mg | ORAL_TABLET | Freq: Four times a day (QID) | ORAL | Status: DC | PRN

## 2023-01-23 MED ORDER — OXYCODONE-ACETAMINOPHEN 5-325 MG PO TABS
1.0000 | ORAL_TABLET | Freq: Four times a day (QID) | ORAL | Status: DC | PRN
  Administered 2023-01-23 – 2023-01-26 (×10): 1 via ORAL
  Filled 2023-01-23 (×11): qty 1

## 2023-01-23 MED ORDER — APIXABAN 5 MG PO TABS
5.0000 mg | ORAL_TABLET | Freq: Two times a day (BID) | ORAL | Status: DC
  Administered 2023-01-23 – 2023-01-26 (×6): 5 mg via ORAL
  Filled 2023-01-23 (×6): qty 1

## 2023-01-23 NOTE — Telephone Encounter (Signed)
Call received from Wanda/ [facility].  I provided her with patient's name and phone number and she said she would reach out to her.

## 2023-01-23 NOTE — Telephone Encounter (Signed)
Call returned to April [name], message left with call back requested.

## 2023-01-23 NOTE — ED Notes (Signed)
Pt was accidentally arrived under ambulance

## 2023-01-23 NOTE — Telephone Encounter (Signed)
Copied from CRM [id number]. Topic: General - Other >> Jan 23, 2023  4:59 PM Macon Large wrote: Reason for CRM: Roderick Pee with [facility] requests that Erskine Squibb return her call at [phone]

## 2023-01-23 NOTE — Telephone Encounter (Signed)
I called [facility] and had to leave a message on the Grief Support line requesting a call back.  I spoke to the individual who answered the main number and requested to speak to someone so I did not have to leave a message but he said that was not an option, I had to leave a message and request a call back.

## 2023-01-23 NOTE — ED Notes (Signed)
..ED TO INPATIENT HANDOFF REPORT  Name/Age/Gender April [name] 52 y.o. female  Code Status Code Status History     Date Active Date Inactive Code Status Order ID Comments User Context   12/05/2022 0235 12/07/2022 1912 Full Code [id number]  Tonye Royalty, DO ED   07/19/2022 2316 07/22/2022 1708 Full Code [id number]  Gery Pray, MD Inpatient   04/28/2022 1037 05/02/2022 1603 Full Code [id number]  Clydie Braun, MD ED   04/09/2022 1144 04/13/2022 1501 Full Code [id number]  Emeline General, MD ED   04/04/2022 1408 04/07/2022 2101 Full Code [id number]  Teddy Spike, DO ED   01/08/2022 0350 01/13/2022 1652 Full Code [id number]  Briscoe Deutscher, MD ED   10/24/2021 0742 10/26/2021 1605 Full Code [id number]  John Giovanni, MD Inpatient   10/09/2021 0301 10/18/2021 1610 Full Code [id number]  Therisa Doyne, MD Inpatient   08/26/2021 2234 08/30/2021 1719 Full Code [id number]  John Giovanni, MD Inpatient   08/23/2021 2319 08/24/2021 0854 Full Code [id number]  Charlsie Quest, MD ED   05/23/2021 1744 05/26/2021 1730 Full Code [id number]  Lorin Glass, MD ED   02/03/2021 2258 02/07/2021 1554 Full Code [id number]  Synetta Fail, MD ED   02/20/2019 0743 02/20/2019 1648 Full Code [id number]  John Giovanni, MD ED   02/14/2019 0453 02/14/2019 1237 Full Code [id number]  Hillary Bow, DO ED   10/26/2018 0456 10/27/2018 1806 Full Code [id number]  Lorretta Harp, MD Inpatient   08/02/2018 2303 08/04/2018 1532 Full Code [id number]  Lorretta Harp, MD ED   05/16/2018 0548 05/20/2018 1739 Full Code [id number]  Briscoe Deutscher, MD ED   10/08/2017 1637 10/15/2017 1722 Full Code [id number]  Alwyn Ren, MD ED   08/07/2017 0405 08/18/2017 2018 Full Code [id number]  Carron Curie, MD ED   07/29/2017 0627 08/06/2017 1524 Full Code [id number]  Eduard Clos, MD ED   07/07/2017 0717 07/12/2017 2008 Full Code [id number]  Marcos Eke, PA-C ED   06/06/2017 0836 06/08/2017 2107 Full Code [id number]  Pieter Partridge, MD ED    05/26/2017 0617 06/01/2017 1653 Full Code [id number]  Lorretta Harp, MD ED   04/27/2017 1623 04/30/2017 1742 Full Code [id number]  Marcos Eke, PA-C ED   02/15/2017 0226 02/16/2017 1806 Full Code [id number]  Kathrynn Running, MD Inpatient   02/11/2017 0038 02/14/2017 0537 Full Code [id number]  Eduard Clos, MD ED   01/27/2017 1024 02/01/2017 1751 Full Code [id number]  Pearson Grippe, MD ED   01/17/2017 0514 01/17/2017 2021 Full Code [id number]  Lorretta Harp, MD ED   01/01/2017 0818 01/02/2017 1920 Full Code [id number]  Gwenyth Bender, NP ED   12/21/2016 2015 12/22/2016 1723 Full Code [id number]  Briscoe Deutscher, MD ED   12/10/2016 1852 12/13/2016 1229 Full Code [id number]  Alwyn Ren, MD ED   11/13/2016 2017 11/15/2016 1758 Full Code [id number]  Clydie Braun, MD ED   10/19/2016 0204 10/20/2016 1755 Full Code [id number]  Clydie Braun, MD ED   10/02/2016 0553 10/04/2016 2204 Full Code [id number]  Alberteen Sam, MD Inpatient   06/22/2016 2104 06/25/2016 1707 Full Code [id number]  Hillary Bow, DO ED   05/05/2016 2044 05/07/2016 1601 Full Code [id number]  Jonah Blue, MD Inpatient   01/30/2016 1937 01/31/2016 1801 Full Code [id number]  Valentino Nose, MD Inpatient   12/19/2015 0640 12/21/2015 1851 Full Code [id number]  Valentino Nose, MD Inpatient   11/27/2015 0019 11/27/2015 2031 Full Code [id number]  Danella Penton,  Elder Love, MD ED   11/10/2015 1408 11/12/2015 1641 Full Code [id number]  Lora Paula, MD ED   10/24/2015 0933 10/26/2015 1626 Full Code [id number]  Marrian Salvage, MD ED   08/22/2015 2052 08/24/2015 1700 Full Code [id number]  Otis Brace, MD ED   08/14/2015 1624 08/15/2015 1644 Full Code [id number]  Selina Cooley, MD ED   07/12/2015 1429 07/13/2015 0015 Full Code [id number]  Alison Murray, MD Inpatient   05/23/2015 1650 05/24/2015 1819 Full Code [id number]  Onnie Boer, MD ED   03/13/2015 1143 03/14/2015 1742 Full Code [id number]  Nani Ravens, MD Inpatient   11/03/2014 0444 11/03/2014 1823 Full  Code [id number]  Hyacinth Meeker, MD Inpatient   09/15/2014 1326 09/16/2014 1658 Full Code [id number]  Annett Gula Inpatient   09/08/2014 1401 09/09/2014 1752 Full Code [id number]  Courtney Paris, MD Inpatient   03/27/2014 2111 03/28/2014 1754 Full Code [id number]  Onnie Boer, MD Inpatient   11/23/2013 1717 11/24/2013 0009 Full Code [id number]  Christen Bame, MD Inpatient   06/06/2013 2326 06/07/2013 1912 Full Code [id number]  Linward Headland, MD Inpatient   11/02/2012 1854 11/03/2012 1909 Full Code [id number]  Belia Heman, MD Inpatient   10/21/2012 1030 10/23/2012 1730 Full Code [id number]  Leodis Sias, MD Inpatient   10/06/2012 2349 10/07/2012 0657 Full Code [id number]  Belia Heman, MD Inpatient   05/22/2012 2200 05/23/2012 1627 Full Code [id number]  Donivan Scull, RN Inpatient   02/27/2012 0219 02/27/2012 1728 Full Code [id number]  Leodis Sias, MD Inpatient    Questions for Most Recent Historical Code Status (Order [id number])     Question Answer   By: Consent: discussion documented in EHR            Home/SNF/Other Home  Chief Complaint Acute asthma exacerbation [J45.901]  Level of Care/Admitting Diagnosis ED Disposition     ED Disposition  Admit   Condition  --   Comment  Hospital Area: [facility] [[id number]]  Level of Care: Progressive [102]  Admit to Progressive based on following criteria: RESPIRATORY PROBLEMS hypoxemic/hypercapnic respiratory failure that is responsive to NIPPV (BiPAP) or High Flow Nasal Cannula (6-80 lpm). Frequent assessment/intervention, no > Q2 hrs < Q4 hrs, to maintain oxygenation and pulmonary hygiene.  May admit patient to [facility] or [facility] if equivalent level of care is available:: No  Covid Evaluation: Confirmed COVID Negative  Diagnosis: Acute asthma exacerbation [833825]  Admitting Physician: Lanae Boast [[id number]]  Attending Physician: Lanae Boast [[id number]]  Certification:: I certify this patient will need  inpatient services for at least 2 midnights  Expected Medical Readiness: 01/25/2023          Medical History Past Medical History:  Diagnosis Date   Acanthosis nigricans    Anxiety    Arthritis    "knees" (04/28/2017)   Asthma    Followed by Dr. Maple Hudson (pulmonology); receives every other week omalizumab injections; has frequent exacerbations   Back pain    Chronic diastolic CHF (congestive heart failure) (HCC) 01/17/2017   COPD (chronic obstructive pulmonary disease) (HCC)    PFTs in 2002, FEV1/FVC 65, no post bronchodilater test done   Depression    GERD (gastroesophageal reflux disease)    Headache(784.0)    "q couple days" (04/28/2017)   Helicobacter pylori (H. pylori) infection    Hypertension, essential    Insomnia    Joint pain    Lower extremity edema    Menorrhagia  Morbid obesity (HCC)    OSA on CPAP    Sleep study 2008 - mild OSA, not enough events to titrate CPAP; wears CPAP now/pt on 04/28/2017   Pneumonia X 1   Prediabetes    Rheumatoid arthritis (HCC)    Seasonal allergies    Shortness of breath    Tobacco user    Vitamin D deficiency     Allergies Allergies  Allergen Reactions   Other Shortness Of Breath and Other (See Comments)    Seasonal allergies- Wheezing   Contrast Media [Iodinated Contrast Media] Itching and Other (See Comments)    CT contrast    IV Location/Drains/Wounds Patient Lines/Drains/Airways Status     Active Line/Drains/Airways     Name Placement date Placement time Site Days   Peripheral IV 01/23/23 18 G Left Antecubital 01/23/23  1705  Antecubital  less than 1            Labs/Imaging Results for orders placed or performed during the hospital encounter of 01/23/23 (from the past 48 hour(s))  SARS Coronavirus 2 by RT PCR (hospital order, performed in [facility] hospital lab) *cepheid single result test* Anterior Nasal Swab     Status: None   Collection Time: 01/23/23  5:02 PM   Specimen: Anterior Nasal Swab  Result  Value Ref Range   SARS Coronavirus 2 by RT PCR NEGATIVE NEGATIVE    Comment: (NOTE) SARS-CoV-2 target nucleic acids are NOT DETECTED.  The SARS-CoV-2 RNA is generally detectable in upper and lower respiratory specimens during the acute phase of infection. The lowest concentration of SARS-CoV-2 viral copies this assay can detect is 250 copies / mL. A negative result does not preclude SARS-CoV-2 infection and should not be used as the sole basis for treatment or other patient management decisions.  A negative result may occur with improper specimen collection / handling, submission of specimen other than nasopharyngeal swab, presence of viral mutation(s) within the areas targeted by this assay, and inadequate number of viral copies (<250 copies / mL). A negative result must be combined with clinical observations, patient history, and epidemiological information.  Fact Sheet for Patients:   RoadLapTop.co.za  Fact Sheet for Healthcare Providers: http://kim-miller.com/  This test is not yet approved or  cleared by the [place] FDA and has been authorized for detection and/or diagnosis of SARS-CoV-2 by FDA under an Emergency Use Authorization (EUA).  This EUA will remain in effect (meaning this test can be used) for the duration of the COVID-19 declaration under Section 564(b)(1) of the Act, 21 U.S.C. section 360bbb-3(b)(1), unless the authorization is terminated or revoked sooner.  Performed at [facility], [address] [place], [place] [address]   Basic metabolic panel     Status: Abnormal   Collection Time: 01/23/23  5:07 PM  Result Value Ref Range   Sodium 137 135 - 145 mmol/L   Potassium 3.5 3.5 - 5.1 mmol/L   Chloride 107 98 - 111 mmol/L   CO2 23 22 - 32 mmol/L   Glucose, Bld 88 70 - 99 mg/dL    Comment: Glucose reference range applies only to samples taken after fasting for at least 8 hours.   BUN 9 6  - 20 mg/dL   Creatinine, Ser 6.96 0.44 - 1.00 mg/dL   Calcium 8.5 (L) 8.9 - 10.3 mg/dL   GFR, Estimated >29 >52 mL/min    Comment: (NOTE) Calculated using the CKD-EPI Creatinine Equation (2021)    Anion gap 7 5 -  15    Comment: Performed at [facility], [address], [place], [place] [address]  CBC with Differential     Status: Abnormal   Collection Time: 01/23/23  5:07 PM  Result Value Ref Range   WBC 12.9 (H) 4.0 - 10.5 K/uL   RBC 4.50 3.87 - 5.11 MIL/uL   Hemoglobin 12.0 12.0 - 15.0 g/dL   HCT 32.3 55.7 - 32.2 %   MCV 86.7 80.0 - 100.0 fL   MCH 26.7 26.0 - 34.0 pg   MCHC 30.8 30.0 - 36.0 g/dL   RDW 02.5 (H) 42.7 - 06.2 %   Platelets 448 (H) 150 - 400 K/uL   nRBC 0.0 0.0 - 0.2 %   Neutrophils Relative % 44 %   Neutro Abs 5.8 1.7 - 7.7 K/uL   Lymphocytes Relative 40 %   Lymphs Abs 5.1 (H) 0.7 - 4.0 K/uL   Monocytes Relative 13 %   Monocytes Absolute 1.6 (H) 0.1 - 1.0 K/uL   Eosinophils Relative 1 %   Eosinophils Absolute 0.1 0.0 - 0.5 K/uL   Basophils Relative 1 %   Basophils Absolute 0.1 0.0 - 0.1 K/uL   Immature Granulocytes 1 %   Abs Immature Granulocytes 0.06 0.00 - 0.07 K/uL    Comment: Performed at [facility], [address] [place], [place] [address]   *Note: Due to a large number of results and/or encounters for the requested time period, some results have not been displayed. A complete set of results can be found in Results Review.   DG Chest Port 1 View  Result Date: 01/23/2023 CLINICAL DATA:  Cough and shortness of breath. EXAM: PORTABLE CHEST 1 VIEW COMPARISON:  December 04, 2022 FINDINGS: The heart size and mediastinal contours are within normal limits. Both lungs are clear. The visualized skeletal structures are unremarkable. IMPRESSION: No active disease. Electronically Signed   By: Aram Candela M.D.   On: 01/23/2023 20:46    Pending Labs Unresulted Labs (From admission, onward)     Start     Ordered   Pending   Expectorated Sputum Assessment w Gram Stain, Rflx to Resp Cult  Once,   R        Pending   Pending  Comprehensive metabolic panel  Tomorrow morning,   R        Pending   Pending  CBC  Tomorrow morning,   R        Pending   Pending  Hemoglobin A1c  Tomorrow morning,   R        Pending            Vitals/Pain Today's Vitals   01/23/23 1800 01/23/23 1902 01/23/23 1945 01/23/23 2124  BP: (!) 145/66 (!) 160/96 (!) 180/102   Pulse: 72     Resp: 16  (!) 22   Temp:    98.6 F (37 C)  TempSrc:    Oral  SpO2: 100%   97%    Isolation Precautions Airborne and Contact precautions  Medications Medications  albuterol (VENTOLIN HFA) 108 (90 Base) MCG/ACT inhaler 2 puff (has no administration in time range)  azithromycin (ZITHROMAX) 500 mg in sodium chloride 0.9 % 250 mL IVPB (500 mg Intravenous New Bag/Given 01/23/23 2057)  albuterol (PROVENTIL) (2.5 MG/3ML) 0.083% nebulizer solution 10 mg (10 mg Nebulization Given 01/23/23 1631)  ipratropium (ATROVENT) nebulizer solution 0.5 mg (0.5 mg Nebulization Given 01/23/23 1631)  ipratropium-albuterol (DUONEB) 0.5-2.5 (3) MG/3ML nebulizer solution 3 mL (  3 mLs Nebulization Given 01/23/23 1750)  methylPREDNISolone sodium succinate (SOLU-MEDROL) 125 mg/2 mL injection 125 mg (125 mg Intravenous Given 01/23/23 1708)  albuterol (PROVENTIL) (2.5 MG/3ML) 0.083% nebulizer solution 2.5 mg (2.5 mg Nebulization Given 01/23/23 1937)    Mobility walks

## 2023-01-23 NOTE — Progress Notes (Signed)
This Clinical research associate spoke with patient regarding cpap use at home.  Pt stated she does not wear cpap at home and does not want to wear one tonight.  Pt was encouraged to call should she change her mind.  RN aware.

## 2023-01-23 NOTE — ED Notes (Addendum)
Pt ambulated to bathroom without assistance, pt was placed on pulse ox in room while standing and O2 sats were 94-95%. Pt walked around room and O2 sats maintained. Pt still having expiratory wheezing. Md and RT notified.

## 2023-01-23 NOTE — ED Notes (Signed)
Pt ambulatory to bathroom w/o assistance.

## 2023-01-23 NOTE — H&P (Signed)
History and Physical    April [name] LKG:[id number] DOB: [birth year] DOA: 01/23/2023  PCP: Hoy Register, MD   Patient coming from: Home  Chief Complaint  Patient presents with   Cough   Asthma     HPI: 52 year old female with history of asthma, COPD, history of intubation/ICU admission for asthma 5 years ago, hypertension, atrial fibrillation on Eliquis, hypothyroidism HLD, presented to the ED with complaint of shortness of breath, onset Sunday and has been worsening especially today, she has associated productive cough, chest tightness and headache while coughing.She describes symptoms similar to her previous asthma attack.  She had sick contacts with her grandchildren over the weekend and she commonly has exacerbation with weather changes.  Currently she denies smoking but smoked in the past. Patient otherwise denies any nausea, vomiting, chills, focal weakness, numbness tingling, speech difficulties.   In the ED: Vitals with stable heart rate tachypnea initially 22-32, currently at 16, saturating well on room air BP in 140-180.  Labs with stable BMP, CBC with leukocytosis which is downtrending from admission leukocytosis of 15.4 in August.  COVID screening negative.  Chest x-ray no active disease.  Patient was given Solu-Medrol 125 mg, DuoNeb, azithromycin and albuterol multiple times, and admission requested for further management. She is requesting for her pain medication and sleeping medication for the night.    Assessment/Plan Active Problems:   Acute asthma exacerbation  Acute asthma exacerbation  COPD w/ acute exacerbation: history of intubation/ICU admission for asthma 5 years ago, normally on inhalers/Singulair at home. Chest x-ray no acute finding. Able to speak in full sentence although tachypneic. We will continue IV Solu-Medrol, azithromycin, Yupleri/pulmicort/brovana and prn albuterol nebulizer.Add antitussives. Check sputum gram stain with reflex culture.RT to check  Peak  flow  Chronic diastolic CHF Mild leg edema: Check BNP resume her oral Lasix and potassium.  OSA: will resume CPAP if taking at home- RT consulted.  Leukocytosis: wbc has been up recently- now downtrending.check procalcitonin, no pneumonia on x-ray  Hypertension: BP is poorly controlled, resume home meds-amlodipine, Coreg and add prn iv  Atrial fibrillation: Cont her Coreg and Eliquis.   Hypothyroidism: Resume Synthroid.  HLD: Resume statin.  Chronic pain/chronic back pain Anxiety/depression Insomnia: Patient on Percocet 10, Lyrica and Neurontin Topamax, Effexor, klonopin prn-she is requesting medication, resume and was respiratory status  Morbid obesity with BMI 53: Will benefit weight loss PCP follow-up healthy lifestyle sleep apnea evaluation if not already done.  Check hemoglobin A1c.  There is no height or weight on file to calculate BMI.   Severity of Illness: The appropriate patient status for this patient is OBSERVATION. Observation status is judged to be reasonable and necessary in order to provide the required intensity of service to ensure the patient's safety. The patient's presenting symptoms, physical exam findings, and initial radiographic and laboratory data in the context of their medical condition is felt to place them at decreased risk for further clinical deterioration. Furthermore, it is anticipated that the patient will be medically stable for discharge from the hospital within 2 midnights of admission.    DVT prophylaxis: apixaban (ELIQUIS) tablet 5 mg  Code Status:   Code Status: Full Code  Family Communication: Admission, patients condition and plan of care including tests being ordered have been discussed with the patient  who indicate understanding and agree with the plan and Code Status.  Consults called:  none  Review of Systems: All systems were reviewed and were negative except as mentioned in HPI above. Negative for  fever Negative for  dysuria Negative for focal weakness  Past Medical History:  Diagnosis Date   Acanthosis nigricans    Anxiety    Arthritis    "knees" (04/28/2017)   Asthma    Followed by Dr. Maple Hudson (pulmonology); receives every other week omalizumab injections; has frequent exacerbations   Back pain    Chronic diastolic CHF (congestive heart failure) (HCC) 01/17/2017   COPD (chronic obstructive pulmonary disease) (HCC)    PFTs in 2002, FEV1/FVC 65, no post bronchodilater test done   Depression    GERD (gastroesophageal reflux disease)    Headache(784.0)    "q couple days" (04/28/2017)   Helicobacter pylori (H. pylori) infection    Hypertension, essential    Insomnia    Joint pain    Lower extremity edema    Menorrhagia    Morbid obesity (HCC)    OSA on CPAP    Sleep study 2008 - mild OSA, not enough events to titrate CPAP; wears CPAP now/pt on 04/28/2017   Pneumonia X 1   Prediabetes    Rheumatoid arthritis (HCC)    Seasonal allergies    Shortness of breath    Tobacco user    Vitamin D deficiency     Past Surgical History:  Procedure Laterality Date   CARDIOVERSION N/A 05/30/2017   Procedure: CARDIOVERSION;  Surgeon: Thurmon Fair, MD;  Location: [facility];  Service: Cardiovascular;  Laterality: N/A;   REDUCTION MAMMAPLASTY Bilateral 09/2011   TUBAL LIGATION  1996   bilateral     reports that she quit smoking about 8 years ago. Her smoking use included cigarettes. She started smoking about 34 years ago. She has a 13 pack-year smoking history. She has never used smokeless tobacco. She reports that she does not drink alcohol and does not use drugs.  Allergies  Allergen Reactions   Other Shortness Of Breath and Other (See Comments)    Seasonal allergies- Wheezing   Contrast Media [Iodinated Contrast Media] Itching and Other (See Comments)    CT contrast    Family History  Problem Relation Age of Onset   Hypertension Mother    Asthma Daughter    Cancer Paternal Aunt    Asthma  Maternal Grandmother      Prior to Admission medications   Medication Sig Start Date End Date Taking? Authorizing Provider  albuterol (VENTOLIN HFA) 108 (90 Base) MCG/ACT inhaler Inhale 2 puffs into the lungs every 6 (six) hours as needed for wheezing or shortness of breath. 01/10/23  Yes Young, Joni Fears D, MD  amLODipine (NORVASC) 10 MG tablet Take 1 tablet (10 mg total) by mouth daily. 08/21/22  Yes Hoy Register, MD  apixaban (ELIQUIS) 5 MG TABS tablet Take 1 tablet (5 mg total) by mouth 2 (two) times daily. 08/21/22  Yes Hoy Register, MD  atorvastatin (LIPITOR) 20 MG tablet Take 1 tablet (20 mg total) by mouth daily. 11/19/22  Yes Hoy Register, MD  carvedilol (COREG) 6.25 MG tablet Take 1 tablet (6.25 mg total) by mouth 2 (two) times daily with a meal. TAKE 1 TABLET(6.25 MG) BY MOUTH TWICE DAILY WITH A MEAL Strength: 6.25 mg 08/21/22  Yes Newlin, Enobong, MD  clonazePAM (KLONOPIN) 0.5 MG tablet Take 0.5 mg by mouth 2 (two) times daily as needed for anxiety.   Yes [provider]  EPINEPHrine 0.3 mg/0.3 mL IJ SOAJ injection Inject 0.3 mg into the muscle as needed for anaphylaxis. 05/30/20  Yes Jetty Duhamel D, MD  famotidine (PEPCID) 40 MG tablet Take  40 mg by mouth at bedtime. 01/25/22  Yes [provider]  Fezolinetant (VEOZAH) 45 MG TABS Take 1 tablet (45 mg total) by mouth daily. 12/02/22  Yes Newlin, Enobong, MD  fluticasone (FLONASE) 50 MCG/ACT nasal spray SHAKE LIQUID AND USE 1 SPRAY IN EACH NOSTRIL DAILY AS NEEDED FOR ALLERGIES OR RHINITIS Patient taking differently: Place 1 spray into both nostrils daily. 08/05/21  Yes Hoy Register, MD  Fluticasone-Umeclidin-Vilant (TRELEGY ELLIPTA) 200-62.5-25 MCG/ACT AEPB Inhale 1 puff into the lungs daily. 05/15/22  Yes Glenford Bayley, NP  furosemide (LASIX) 20 MG tablet TAKE 1 TABLET(20 MG) BY MOUTH DAILY 10/08/22  Yes Hoy Register, MD  gabapentin (NEURONTIN) 300 MG capsule Take 300 mg by mouth 3 (three) times daily.   Yes  [provider]  ipratropium-albuterol (DUONEB) 0.5-2.5 (3) MG/3ML SOLN 1 neb every 6 hours if needed for asthma Patient taking differently: Take 3 mLs by nebulization every 4 (four) hours as needed (shortness of breath). 01/28/22  Yes Young, Joni Fears D, MD  levothyroxine (SYNTHROID) 50 MCG tablet Take 1 tablet (50 mcg total) by mouth daily. Patient taking differently: Take 50 mcg by mouth daily before breakfast. 11/28/21  Yes Newlin, Enobong, MD  montelukast (SINGULAIR) 10 MG tablet Take 1 tablet (10 mg total) by mouth at bedtime. 11/28/22  Yes Hoy Register, MD  naloxone (NARCAN) nasal spray 4 mg/0.1 mL Place 1 spray into the nose once as needed (opioid overdose). 08/09/20  Yes [provider]  omeprazole (PRILOSEC) 40 MG capsule Take 40 mg by mouth at bedtime.   Yes [provider]  oxyCODONE-acetaminophen (PERCOCET) 10-325 MG tablet Take 1 tablet by mouth 5 (five) times daily as needed for pain. Patient taking differently: Take 1 tablet by mouth 4 (four) times daily. 03/26/22  Yes   Potassium Chloride ER 20 MEQ TBCR Take 1 tablet by mouth daily. 11/24/22  Yes [provider]  pregabalin (LYRICA) 25 MG capsule Take 25 mg by mouth 2 (two) times daily. 11/12/22  Yes [provider]  Semaglutide, 1 MG/DOSE, (OZEMPIC, 1 MG/DOSE,) 4 MG/3ML SOPN Inject 1 mg into the skin every Monday. Patient taking differently: Inject 1 mg into the skin once a week. 12/02/22  Yes Hoy Register, MD  solifenacin (VESICARE) 5 MG tablet Take 1 tablet (5 mg total) by mouth daily. Patient taking differently: Take 5 mg by mouth every evening. 08/06/22  Yes Hoy Register, MD  Tezepelumab-ekko (TEZSPIRE) 210 MG/1. SOAJ Inject 210 mg into the skin every 28 (twenty-eight) days. 11/12/22  Yes Young, Joni Fears D, MD  topiramate (TOPAMAX) 200 MG tablet Take 200 mg by mouth at bedtime. 01/08/21  Yes [provider]  venlafaxine XR (EFFEXOR XR) 75 MG 24 hr capsule Take 1 capsule (75 mg  total) by mouth daily with breakfast. For hot flashes. 12/10/22  Yes Hoy Register, MD  Vitamin D, Ergocalciferol, (DRISDOL) 1.25 MG (50000 UNIT) CAPS capsule Take 50,000 Units by mouth every 7 (seven) days. 11/30/21  Yes [provider]  zolpidem (AMBIEN) 10 MG tablet TAKE 1 TABLET BY MOUTH AT BEDTIME AS NEEDED FOR SLEEP Patient taking differently: Take 10 mg by mouth at bedtime. 08/05/22  Yes Young, Joni Fears D, MD  cloNIDine (CATAPRES) 0.2 MG tablet Take 1 tablet (0.2 mg total) by mouth 2 (two) times daily. TAKE 1 TABLET(0.2 MG) BY MOUTH TWICE DAILY Strength: 0.2 mg Patient not taking: Reported on 01/23/2023 08/21/22   Hoy Register, MD  mometasone-formoterol (DULERA) 200-5 MCG/ACT AERO Inhale 2 puffs into the lungs  2 (two) times daily. Dx: Asthma 04/08/19 08/12/19  [name], MD   Physical Exam: Vitals:   01/23/23 1800 01/23/23 1902 01/23/23 1945 01/23/23 2124  BP: (!) 145/66 (!) 160/96 (!) 180/102   Pulse: 72     Resp: 16  (!) 22   Temp:    98.6 F (37 C)  TempSrc:    Oral  SpO2: 100%   97%    General exam: AAOx3, obese HEENT:Oral mucosa moist, Ear/Nose WNL grossly, dentition normal. Respiratory system: bilaterally air entry present with diffuse expiratory and inspiratory wheezing,no use of accessory muscle Cardiovascular system: S1 & S2 +, No JVD,. Gastrointestinal system: Abdomen soft, NT,ND, BS+ Nervous System:Alert, awake, moving extremities and grossly nonfocal Extremities: b/l ankle edema, distal peripheral pulses palpable.  Skin: No rashes,no icterus. MSK: Normal muscle bulk,tone, power  Labs on Admission: I have personally reviewed following labs and imaging studies.  CBC: Recent Labs  Lab 01/23/23 1707  WBC 12.9*  NEUTROABS 5.8  HGB 12.0  HCT 39.0  MCV 86.7  PLT 448*   Basic Metabolic Panel: Recent Labs  Lab 01/23/23 1707  NA 137  K 3.5  CL 107  CO2 23  GLUCOSE 88  BUN 9  CREATININE 0.69  CALCIUM 8.5*   Estimated Creatinine Clearance:  122.5 mL/min (by C-G formula based on SCr of 0.69 mg/dL). Urine analysis:    Component Value Date/Time   COLORURINE YELLOW 10/23/2021 1826   APPEARANCEUR CLOUDY (A) 10/23/2021 1826   LABSPEC 1.027 10/23/2021 1826   PHURINE 5.0 10/23/2021 1826   GLUCOSEU NEGATIVE 10/23/2021 1826   GLUCOSEU NEG mg/dL 32/35/5732 2025   HGBUR NEGATIVE 10/23/2021 1826   BILIRUBINUR NEGATIVE 10/23/2021 1826   KETONESUR NEGATIVE 10/23/2021 1826   PROTEINUR NEGATIVE 10/23/2021 1826   UROBILINOGEN 1.0 11/21/2014 0707   NITRITE NEGATIVE 10/23/2021 1826   LEUKOCYTESUR LARGE (A) 10/23/2021 1826  Radiological Exams on Admission: DG Chest Port 1 View  Result Date: 01/23/2023 CLINICAL DATA:  Cough and shortness of breath. EXAM: PORTABLE CHEST 1 VIEW COMPARISON:  December 04, 2022 FINDINGS: The heart size and mediastinal contours are within normal limits. Both lungs are clear. The visualized skeletal structures are unremarkable. IMPRESSION: No active disease. Electronically Signed   By: Aram Candela M.D.   On: 01/23/2023 20:46    Lanae Boast MD [facility]  If 7PM-7AM, please contact night-coverage [email or web address]  01/23/2023, 9:54 PM

## 2023-01-23 NOTE — ED Provider Notes (Signed)
[place] EMERGENCY DEPARTMENT AT [facility] Provider Note   CSN: [id number] Arrival date & time: 01/23/23  1513     History  Chief Complaint  Patient presents with   Cough   Asthma    April [name] is a 52 y.o. female.  Patient is a 52 year old female with a past medical history of asthma, COPD, hypertension, A-fib on Eliquis presenting to the emergency department with shortness of breath.  The patient states that she has had increasing shortness of breath since Sunday with associated cough and chest tightness.  She states that this does feel consistent with her asthma.  She states that she was exposed to her grandchild over the weekend who was sick with a URI and also states that she commonly has exacerbations with weather changes.  The patient states that she has had prior intubation and ICU admission for her asthma, last time was about 5 years ago.   Cough Asthma       Home Medications Prior to Admission medications   Medication Sig Start Date End Date Taking? Authorizing Provider  albuterol (VENTOLIN HFA) 108 (90 Base) MCG/ACT inhaler Inhale 2 puffs into the lungs every 6 (six) hours as needed for wheezing or shortness of breath. 01/10/23   Jetty Duhamel D, MD  amLODipine (NORVASC) 10 MG tablet Take 1 tablet (10 mg total) by mouth daily. 08/21/22   Hoy Register, MD  apixaban (ELIQUIS) 5 MG TABS tablet Take 1 tablet (5 mg total) by mouth 2 (two) times daily. 08/21/22   Hoy Register, MD  atorvastatin (LIPITOR) 20 MG tablet Take 1 tablet (20 mg total) by mouth daily. 11/19/22   Hoy Register, MD  carvedilol (COREG) 6.25 MG tablet Take 1 tablet (6.25 mg total) by mouth 2 (two) times daily with a meal. TAKE 1 TABLET(6.25 MG) BY MOUTH TWICE DAILY WITH A MEAL Strength: 6.25 mg 08/21/22   Hoy Register, MD  clonazePAM (KLONOPIN) 0.5 MG tablet Take 0.5 mg by mouth 2 (two) times daily as needed for anxiety.    [provider]  cloNIDine (CATAPRES) 0.2 MG  tablet Take 1 tablet (0.2 mg total) by mouth 2 (two) times daily. TAKE 1 TABLET(0.2 MG) BY MOUTH TWICE DAILY Strength: 0.2 mg 08/21/22   Hoy Register, MD  EPINEPHrine 0.3 mg/0.3 mL IJ SOAJ injection Inject 0.3 mg into the muscle as needed for anaphylaxis. 05/30/20   [name], MD  famotidine (PEPCID) 40 MG tablet Take 40 mg by mouth daily in the afternoon. 01/25/22   [provider]  Fezolinetant (VEOZAH) 45 MG TABS Take 1 tablet (45 mg total) by mouth daily. 12/02/22   Hoy Register, MD  fluticasone (FLONASE) 50 MCG/ACT nasal spray SHAKE LIQUID AND USE 1 SPRAY IN EACH NOSTRIL DAILY AS NEEDED FOR ALLERGIES OR RHINITIS Patient taking differently: Place 1 spray into both nostrils daily. 08/05/21   Hoy Register, MD  Fluticasone-Umeclidin-Vilant (TRELEGY ELLIPTA) 200-62.5-25 MCG/ACT AEPB Inhale 1 puff into the lungs daily. 05/15/22   Glenford Bayley, NP  furosemide (LASIX) 20 MG tablet TAKE 1 TABLET(20 MG) BY MOUTH DAILY 10/08/22   Hoy Register, MD  ipratropium-albuterol (DUONEB) 0.5-2.5 (3) MG/3ML SOLN 1 neb every 6 hours if needed for asthma Patient taking differently: Take 3 mLs by nebulization every 4 (four) hours as needed (shortness of breath). 01/28/22   [name], MD  levothyroxine (SYNTHROID) 50 MCG tablet Take 1 tablet (50 mcg total) by mouth daily. Patient taking differently: Take 50 mcg by mouth  daily before breakfast. 11/28/21   Hoy Register, MD  montelukast (SINGULAIR) 10 MG tablet Take 1 tablet (10 mg total) by mouth at bedtime. 11/28/22   Hoy Register, MD  naloxone [facility]) nasal spray 4 mg/0.1 mL Place 1 spray into the nose once as needed (opioid overdose). 08/09/20   [provider]  oxyCODONE-acetaminophen (PERCOCET) 10-325 MG tablet Take 1 tablet by mouth 5 (five) times daily as needed for pain. Patient taking differently: Take 1 tablet by mouth 4 (four) times daily. 03/26/22     Potassium Chloride ER 20 MEQ TBCR Take 1 tablet by mouth daily.  11/24/22   [provider]  pregabalin (LYRICA) 25 MG capsule Take 25 mg by mouth 3 (three) times daily as needed. 11/12/22   [provider]  Semaglutide, 1 MG/DOSE, (OZEMPIC, 1 MG/DOSE,) 4 MG/3ML SOPN Inject 1 mg into the skin every Monday. 12/02/22   Hoy Register, MD  solifenacin (VESICARE) 5 MG tablet Take 1 tablet (5 mg total) by mouth daily. 08/06/22   Hoy Register, MD  Tezepelumab-ekko (TEZSPIRE) 210 MG/1. SOAJ Inject 210 mg into the skin every 28 (twenty-eight) days. 11/12/22   Jetty Duhamel D, MD  topiramate (TOPAMAX) 200 MG tablet Take 200 mg by mouth at bedtime. 01/08/21   [provider]  venlafaxine XR (EFFEXOR XR) 75 MG 24 hr capsule Take 1 capsule (75 mg total) by mouth daily with breakfast. For hot flashes. 12/10/22   Hoy Register, MD  Vitamin D, Ergocalciferol, (DRISDOL) 1.25 MG (50000 UNIT) CAPS capsule Take 50,000 Units by mouth every Friday. 11/30/21   [provider]  zolpidem (AMBIEN) 10 MG tablet TAKE 1 TABLET BY MOUTH AT BEDTIME AS NEEDED FOR SLEEP 08/05/22   Young, Joni Fears D, MD  mometasone-formoterol [facility]) 200-5 MCG/ACT AERO Inhale 2 puffs into the lungs 2 (two) times daily. Dx: Asthma 04/08/19 08/12/19  [name], MD      Allergies    Other and Contrast media [iodinated contrast media]    Review of Systems   Review of Systems  Respiratory:  Positive for cough.     Physical Exam Updated Vital Signs BP (!) 160/96   Pulse 72   Temp 97.7 F (36.5 C) (Oral)   Resp 16   LMP 01/07/2023 (Exact Date)   SpO2 100%  Physical Exam Vitals and nursing note reviewed.  Constitutional:      Appearance: Normal appearance. She is ill-appearing. She is not toxic-appearing.  HENT:     Head: Normocephalic and atraumatic.     Nose: Nose normal.     Mouth/Throat:     Mouth: Mucous membranes are moist.     Pharynx: Oropharynx is clear.  Eyes:     Extraocular Movements: Extraocular movements intact.     Conjunctiva/sclera:  Conjunctivae normal.  Cardiovascular:     Rate and Rhythm: Normal rate and regular rhythm.     Heart sounds: Normal heart sounds.  Pulmonary:     Effort: No respiratory distress.     Breath sounds: Wheezing (Diffuse inspiratory and expiratory) present.     Comments: Tachypneic but speaking in full sentences, no retractions Abdominal:     General: Abdomen is flat.     Palpations: Abdomen is soft.     Tenderness: There is no abdominal tenderness.  Musculoskeletal:        General: Normal range of motion.     Cervical back: Normal range of motion.     Right lower leg: No edema.  Left lower leg: No edema.  Skin:    General: Skin is warm and dry.  Neurological:     General: No focal deficit present.     Mental Status: She is alert and oriented to person, place, and time.  Psychiatric:        Mood and Affect: Mood normal.        Behavior: Behavior normal.     ED Results / Procedures / Treatments   Labs (all labs ordered are listed, but only abnormal results are displayed) Labs Reviewed  BASIC METABOLIC PANEL - Abnormal; Notable for the following components:      Result Value   Calcium 8.5 (*)    All other components within normal limits  CBC WITH DIFFERENTIAL/PLATELET - Abnormal; Notable for the following components:   WBC 12.9 (*)    RDW 19.4 (*)    Platelets 448 (*)    Lymphs Abs 5.1 (*)    Monocytes Absolute 1.6 (*)    All other components within normal limits  SARS CORONAVIRUS 2 BY RT PCR    EKG EKG Interpretation Date/Time:  Thursday January 23 2023 15:53:09 EDT Ventricular Rate:  89 PR Interval:  167 QRS Duration:  92 QT Interval:  389 QTC Calculation: 468 R Axis:   -54  Text Interpretation: Sinus rhythm LAE, consider biatrial enlargement Left anterior fascicular block Probable anteroseptal infarct, old Baseline wander in lead(s) I Otherwise no significant change Confirmed by Elayne Snare ([id number]) on 01/23/2023 5:31:38 PM  Radiology DG Chest Port 1  View  Result Date: 01/23/2023 CLINICAL DATA:  Cough and shortness of breath. EXAM: PORTABLE CHEST 1 VIEW COMPARISON:  December 04, 2022 FINDINGS: The heart size and mediastinal contours are within normal limits. Both lungs are clear. The visualized skeletal structures are unremarkable. IMPRESSION: No active disease. Electronically Signed   By: Aram Candela M.D.   On: 01/23/2023 20:46    Procedures Procedures    Medications Ordered in ED Medications  albuterol (VENTOLIN HFA) 108 (90 Base) MCG/ACT inhaler 2 puff (has no administration in time range)  azithromycin (ZITHROMAX) 500 mg in sodium chloride 0.9 % 250 mL IVPB (500 mg Intravenous New Bag/Given 01/23/23 2057)  albuterol (PROVENTIL) (2.5 MG/3ML) 0.083% nebulizer solution 10 mg (10 mg Nebulization Given 01/23/23 1631)  ipratropium (ATROVENT) nebulizer solution 0.5 mg (0.5 mg Nebulization Given 01/23/23 1631)  ipratropium-albuterol (DUONEB) 0.5-2.5 (3) MG/3ML nebulizer solution 3 mL (3 mLs Nebulization Given 01/23/23 1750)  methylPREDNISolone sodium succinate (SOLU-MEDROL) 125 mg/2 mL injection 125 mg (125 mg Intravenous Given 01/23/23 1708)  albuterol (PROVENTIL) (2.5 MG/3ML) 0.083% nebulizer solution 2.5 mg (2.5 mg Nebulization Given 01/23/23 1937)    ED Course/ Medical Decision Making/ A&P Clinical Course as of 01/23/23 2121  Thu Jan 23, 2023  1930 On reassessment, patient reports significant improvement of her symptoms.  She still has mild end expiratory wheeze on exam.  Will be given additional med and will have a detailed pulse ox trial.  Chest x-ray read is pending at this time. [VK]  2043 Patient with continued wheezing on exam. Will be admitted for Asthma/COPD exacerbation. Started on azithromycin for COPD exacerbation. [VK]    Clinical Course User Index [VK] Rexford Maus, DO                                 Medical Decision Making This patient presents to the ED with chief complaint(s) of shortness of breath with  pertinent past medical history of asthma, COPD, hypertension, CHF, A-fib on Eliquis which further complicates the presenting complaint. The complaint involves an extensive differential diagnosis and also carries with it a high risk of complications and morbidity.    The differential diagnosis includes patient has significant wheezing on exam consistent with asthma or COPD exacerbation, considering viral syndrome, pneumonia, pneumothorax, pulmonary edema, pleural effusion, ACS, arrhythmia  Additional history obtained: Additional history obtained from N/A Records reviewed previous admission documents  ED Course and Reassessment: On patient's arrival she was tachypneic though satting well on room air with diffuse inspiratory and expiratory wheeze concerning for asthma exacerbation.  She is immediately brought back to a room and started on continuous albuterol neb.  The patient will additionally be given Solu-Medrol.  The patient will have labs, chest x-ray, COVID swab and EKG and she will continue to be closely monitored.  Independent labs interpretation:  The following labs were independently interpreted: mild leukocytosis otherwise within normal range   Independent visualization of imaging: - I independently visualized the following imaging with scope of interpretation limited to determining acute life threatening conditions related to emergency care: CXR, which revealed no acute disease  Consultation: - Consulted or discussed management/test interpretation w/ external professional: hospitalist  Consideration for admission or further workup: patient requires admission for persistent COPD exacerbation Social Determinants of health: N/A    Amount and/or Complexity of Data Reviewed Labs: ordered. Radiology: ordered.  Risk Prescription drug management. Decision regarding hospitalization.          Final Clinical Impression(s) / ED Diagnoses Final diagnoses:  COPD exacerbation  [facility])    Rx / DC Orders ED Discharge Orders     None         Rexford Maus, DO 01/23/23 2121

## 2023-01-23 NOTE — ED Triage Notes (Signed)
Pt arrived POV reporting cough, cp, shob. Hx of Asthma. States been dealing with it all week. Got worse Sunday.  States was exposed to grand child who was sick this past weekend. Last breathing treatment was done today at 11am. Patient wheezing in triage.

## 2023-01-23 NOTE — ED Notes (Signed)
Report given to Ariel/Liz, RN

## 2023-01-23 NOTE — Hospital Course (Addendum)
HPI: 52 year old female with history of asthma, COPD, history of intubation/ICU admission for asthma 5 years ago, hypertension, atrial fibrillation on Eliquis, hypothyroidism HLD, presented to the ED with complaint of shortness of breath, onset Sunday and has been worsening especially today, she has associated productive cough, chest tightness and headache while coughing.She describes symptoms similar to her previous asthma attack.  She had sick contacts with her grandchildren over the weekend and she commonly has exacerbation with weather changes.  Currently she denies smoking but smoked in the past. Patient otherwise denies any nausea, vomiting, chills, focal weakness, numbness tingling, speech difficulties.   In the ED: Vitals with stable heart rate tachypnea initially 22-32, currently at 16, saturating well on room air BP in 140-180.  Labs with stable BMP, CBC with leukocytosis which is downtrending from admission leukocytosis of 15.4 in August.  COVID screening negative.  Chest x-ray no active disease.  Patient was given Solu-Medrol 125 mg, DuoNeb, azithromycin and albuterol multiple times, and admission requested for further management. She is requesting for her pain medication and sleeping medication for the night.  Significant Events: Admitted 01/23/2023 for asthma exacerbation   Significant Labs: 01-23-2023 Procalcitonin < 0.10 01-24-2023 HgBA1C 5.5% 01-23-2023 BNP 49.5 01-26-2023 Procal <0.10  Significant Imaging Studies: Admitting CXR negative Discharge CXR negative  Antibiotic Therapy: Anti-infectives (From admission, onward)    Start     Dose/Rate Route Frequency Ordered Stop   01/25/23 0600  azithromycin (ZITHROMAX) tablet 500 mg  Status:  Discontinued       Placed in "Followed by" Linked Group   500 mg Oral Daily 01/23/23 2141 01/24/23 1434   01/24/23 0600  azithromycin (ZITHROMAX) 500 mg in sodium chloride 0.9 % 250 mL IVPB       Placed in "Followed by" Linked Group   500  mg 250 mL/hr over 60 Minutes Intravenous Every 24 hours 01/23/23 2141 01/24/23 0651   01/23/23 2045  azithromycin (ZITHROMAX) 500 mg in sodium chloride 0.9 % 250 mL IVPB        50 0 mg 250 mL/hr over 60 Minutes Intravenous  Once 01/23/23 2043 01/23/23 2157       Procedures:   Consultants:

## 2023-01-24 DIAGNOSIS — J4551 Severe persistent asthma with (acute) exacerbation: Secondary | ICD-10-CM

## 2023-01-24 DIAGNOSIS — Z6841 Body Mass Index (BMI) 40.0 and over, adult: Secondary | ICD-10-CM | POA: Diagnosis not present

## 2023-01-24 DIAGNOSIS — I10 Essential (primary) hypertension: Secondary | ICD-10-CM

## 2023-01-24 DIAGNOSIS — G4733 Obstructive sleep apnea (adult) (pediatric): Secondary | ICD-10-CM

## 2023-01-24 DIAGNOSIS — I480 Paroxysmal atrial fibrillation: Secondary | ICD-10-CM | POA: Diagnosis not present

## 2023-01-24 DIAGNOSIS — I5032 Chronic diastolic (congestive) heart failure: Secondary | ICD-10-CM | POA: Diagnosis not present

## 2023-01-24 LAB — CBC
HCT: 39 % (ref 36.0–46.0)
Hemoglobin: 11.6 g/dL — ABNORMAL LOW (ref 12.0–15.0)
MCH: 27 pg (ref 26.0–34.0)
MCHC: 29.7 g/dL — ABNORMAL LOW (ref 30.0–36.0)
MCV: 90.7 fL (ref 80.0–100.0)
Platelets: 394 10*3/uL (ref 150–400)
RBC: 4.3 MIL/uL (ref 3.87–5.11)
RDW: 19.5 % — ABNORMAL HIGH (ref 11.5–15.5)
WBC: 11.5 10*3/uL — ABNORMAL HIGH (ref 4.0–10.5)
nRBC: 0 % (ref 0.0–0.2)

## 2023-01-24 LAB — COMPREHENSIVE METABOLIC PANEL
ALT: 15 U/L (ref 0–44)
AST: 24 U/L (ref 15–41)
Albumin: 3.4 g/dL — ABNORMAL LOW (ref 3.5–5.0)
Alkaline Phosphatase: 67 U/L (ref 38–126)
Anion gap: 7 (ref 5–15)
BUN: 11 mg/dL (ref 6–20)
CO2: 18 mmol/L — ABNORMAL LOW (ref 22–32)
Calcium: 8.5 mg/dL — ABNORMAL LOW (ref 8.9–10.3)
Chloride: 107 mmol/L (ref 98–111)
Creatinine, Ser: 0.76 mg/dL (ref 0.44–1.00)
GFR, Estimated: >60 mL/min (ref >60)
Glucose, Bld: 109 mg/dL — ABNORMAL HIGH (ref 70–99)
Potassium: 4.7 mmol/L (ref 3.5–5.1)
Sodium: 132 mmol/L — ABNORMAL LOW (ref 135–145)
Total Bilirubin: 0.9 mg/dL (ref 0.3–1.2)
Total Protein: 7.7 g/dL (ref 6.5–8.1)

## 2023-01-24 LAB — HEMOGLOBIN A1C
Hgb A1c MFr Bld: 5.5 % (ref 4.8–5.6)
Mean Plasma Glucose: 111.15 mg/dL

## 2023-01-24 MED ORDER — HYDROMORPHONE HCL 1 MG/ML IJ SOLN
0.5000 mg | Freq: Once | INTRAMUSCULAR | Status: AC
  Administered 2023-01-24: 0.5 mg via INTRAVENOUS
  Filled 2023-01-24: qty 0.5

## 2023-01-24 NOTE — Assessment & Plan Note (Signed)
Pt refuses to wear CPAP.  °

## 2023-01-24 NOTE — TOC Initial Note (Addendum)
Transition of Care [facility]) - Initial/Assessment Note    Patient Details  Name: April [name] MRN: [id number] Date of Birth: 05/11/1970  [facility]) CM/SW Contact:    Lanier Clam, RN Phone Number: 01/24/2023, 11:24 AM  Clinical Narrative: d/c plan home. States she uses rw,cane-difficulty getting around in home. No preference for HHC agency-noted on past admission [facility] may have received referral.Has own transport home.Await PT eval & recc.  -2:46p-PT no f/u. Patient informed of no medical need for HHPT-she voiced understanding.                 Expected Discharge Plan: Home w Home Health Services Barriers to Discharge: Continued Medical Work up   Patient Goals and CMS Choice Patient states their goals for this hospitalization and ongoing recovery are:: Home CMS Medicare.gov Compare Post Acute Care list provided to:: Patient Choice offered to / list presented to : Patient [place] ownership interest in [facility].provided to:: Patient    Expected Discharge Plan and Services   Discharge Planning Services: CM Consult Post Acute Care Choice: Resumption of Svcs/PTA Provider Living arrangements for the past 2 months: Single Family Home                                      Prior Living Arrangements/Services Living arrangements for the past 2 months: Single Family Home Lives with:: Self Patient language and need for interpreter reviewed:: Yes Do you feel safe going back to the place where you live?: Yes      Need for Family Participation in Patient Care: Yes (Comment) Care giver support system in place?: Yes (comment) Current home services: DME (cane,rw,w/c) Criminal Activity/Legal Involvement Pertinent to Current Situation/Hospitalization: No - Comment as needed  Activities of Daily Living   ADL Screening (condition at time of admission) Does the patient have a NEW difficulty with bathing/dressing/toileting/self-feeding that is expected to last  >3 days?: No Does the patient have a NEW difficulty with getting in/out of bed, walking, or climbing stairs that is expected to last >3 days?: No Does the patient have a NEW difficulty with communication that is expected to last >3 days?: No Is the patient deaf or have difficulty hearing?: No Does the patient have difficulty seeing, even when wearing glasses/contacts?: No Does the patient have difficulty concentrating, remembering, or making decisions?: No  Permission Sought/Granted Permission sought to share information with : Case Manager Permission granted to share information with : Yes, Verbal Permission Granted  Share Information with NAME: Case manager           Emotional Assessment Appearance:: Appears stated age Attitude/Demeanor/Rapport: Gracious Affect (typically observed): Accepting Orientation: : Oriented to Self, Oriented to Place, Oriented to  Time, Oriented to Situation Alcohol / Substance Use: Not Applicable Psych Involvement: No (comment)  Admission diagnosis:  COPD exacerbation (HCC) [J44.1] Acute asthma exacerbation [J45.901] Patient Active Problem List   Diagnosis Date Noted   COPD exacerbation (HCC) 12/05/2022   Acute asthma exacerbation 07/19/2022   Primary osteoarthritis of left knee 06/11/2022   Asthma with COPD with exacerbation (HCC) 04/28/2022   Chronic pain syndrome    Hypothyroidism 08/23/2021   Chronic heart failure with preserved ejection fraction (HFpEF) (HCC) 08/23/2021   Primary osteoarthritis of right knee 07/04/2021   Body mass index 50.0-59.9, adult (HCC) 07/04/2021   Painful orthopaedic hardware (HCC) 07/04/2021   Severe persistent asthma with exacerbation 07/04/2020  Acute respiratory failure (HCC) 02/20/2019   CHF exacerbation (HCC) 02/20/2019   Hot flashes    Asthma-COPD overlap syndrome 10/26/2018   Acute respiratory distress 08/03/2018   Vitamin D deficiency 05/26/2018   Urinary incontinence 11/17/2017   Nausea with vomiting  11/06/2017   Hypomagnesemia 10/27/2017   Dysphagia 10/27/2017   Physical deconditioning 10/25/2017   Acute maxillary sinusitis 10/24/2017   Emesis, persistent 10/08/2017   Volume depletion 10/08/2017   Chest pain 10/08/2017   SOB (shortness of breath) 10/08/2017   Debility 09/22/2017   AKI (acute kidney injury) (HCC) 08/18/2017   HCAP (healthcare-associated pneumonia) 08/06/2017   Acute on chronic respiratory failure with hypoxemia (HCC) 08/05/2017   Chronic atrial fibrillation 07/29/2017   AF (paroxysmal atrial fibrillation) (HCC)    Dyspnea 02/04/2017   Metabolic syndrome 12/06/2016   Moderate persistent asthma with exacerbation 10/19/2016   Hypochromic anemia 10/02/2016   Elevated hemoglobin A1c 05/05/2016   GERD (gastroesophageal reflux disease) 08/30/2015   Anxiety and depression 08/30/2015   Generalized anxiety disorder 08/30/2015   Hyperlipidemia 12/08/2013   Seasonal allergic rhinitis 08/29/2013   Leukocytosis 10/21/2012   Tobacco abuse in remission 10/07/2012   Chronic obstructive pulmonary disease with acute exacerbation (HCC) 05/07/2012   Knee pain, bilateral 04/25/2011   Insomnia 03/14/2011   Obstructive sleep apnea 12/19/2010   Hypokalemia 08/13/2010   Cervical back pain with evidence of disc disease 04/08/2008   HTN (hypertension) 07/31/2006   Morbid obesity due to excess calories (HCC) 06/17/2006   Major depressive disorder, recurrent episode (HCC) 04/10/2006   PCP:  Hoy Register, MD Pharmacy:   [id number] - [place], [place] - [address] AT [facility] OF [address] [place] [address] Phone: [phone] Fax: [phone]     Social Determinants of Health (SDOH) Social History: SDOH Screenings   Food Insecurity: No Food Insecurity (01/23/2023)  Housing: Low Risk  (01/23/2023)  Transportation Needs: No Transportation Needs (01/23/2023)  Utilities: Not At Risk (01/23/2023)  Alcohol Screen:  Low Risk  (11/20/2022)  Depression (PHQ2-9): Medium Risk (11/20/2022)  Financial Resource Strain: Medium Risk (11/20/2022)  Physical Activity: Insufficiently Active (11/20/2022)  Social Connections: Socially Isolated (11/20/2022)  Stress: Stress Concern Present (11/20/2022)  Tobacco Use: Medium Risk (01/23/2023)  Health Literacy: Adequate Health Literacy (11/20/2022)   SDOH Interventions:     Readmission Risk Interventions    01/24/2023   11:12 AM 12/07/2022   10:59 AM 07/22/2022   11:56 AM  Readmission Risk Prevention Plan  Transportation Screening Complete Complete Complete  Medication Review Oceanographer)  Complete Complete  PCP or Specialist appointment within 3-5 days of discharge Complete Complete Complete  HRI or Home Care Consult Complete Complete Complete  SW Recovery Care/Counseling Consult Complete Complete Complete  Palliative Care Screening Not Applicable Not Applicable Not Applicable  Skilled Nursing Facility Not Applicable Not Applicable Not Applicable

## 2023-01-24 NOTE — Progress Notes (Signed)
Mobility Specialist - Progress Note  Pre-mobility: 100% SpO2 During mobility: 96% SpO2 Post-mobility: 99% SPO2   01/24/23 1147  Oxygen Therapy  O2 Device Room Air  Patient Activity (if Appropriate) Ambulating  Mobility  Activity Ambulated independently in hallway  Level of Assistance Independent  Assistive Device None  Distance Ambulated (ft) 100 ft  Range of Motion/Exercises Active  Activity Response Tolerated well  Mobility Referral Yes  $Mobility charge 1 Mobility  Mobility Specialist Start Time (ACUTE ONLY) 1135  Mobility Specialist Stop Time (ACUTE ONLY) 1147  Mobility Specialist Time Calculation (min) (ACUTE ONLY) 12 min   Pt was found in bed and agreeable to ambulate after bathroom use. Pt was found on RA and declined to use O2. Pt had coughing and wheezing during session. At EOS returned to sit EOB with all needs met. Call bell in reach and RN in room.  Billey Chang Mobility Specialist

## 2023-01-24 NOTE — Assessment & Plan Note (Addendum)
Admitted on 01-23-2023 for asthma exacerbation. Remains on IV solumedrol with plans to change to po prednisone tomorrow. Remains on RA even while ambulating. Plan for DC in the AM. Pt confirms she has nebulizer machine at home. Will need Rx for duonebs.  Procalcitonin is normal. Will stop zithromax.

## 2023-01-24 NOTE — Assessment & Plan Note (Signed)
Pt with morbid obesity with BMI 50. On ozempic by PCP. Had asked her PCP for bariatric surgery referral.  On 01-24-2023, pt demanding to be changed to regular diet.  Her request has been refused. Discussed with pt on 01-24-2023 that she is on steroids which will make her retain water and elevate her CBG. I refuse to create more medical problems for her. She has enough of them.  Overnight providers, please do not change her diet. It should stay heart healthy/diabetic diet.

## 2023-01-24 NOTE — Assessment & Plan Note (Signed)
Stable.  On coreg and norvasc.

## 2023-01-24 NOTE — Progress Notes (Signed)
RT NOTE:  Pre-treatment Peak flow: 200 Post-treatment Peak flow: 250  Good pt effort.

## 2023-01-24 NOTE — Assessment & Plan Note (Signed)
Pt is morbidly obese. BMI 50

## 2023-01-24 NOTE — Evaluation (Addendum)
Physical Therapy Evaluation Patient Details Name: April [name] MRN: [id number] DOB: July 21, 1970 Today's Date: 01/24/2023  History of Present Illness  Pt is 52 yo female admitted on 01/24/23 with acute asthma exacerbation. Pt with hx including but not limited to asthma, COPD, HTN, afib, hypothyroidism, HLD, obesity  Clinical Impression  Pt admitted with above diagnosis. At baseline, pt is able to ambulate in community.  She occasionally uses her cane.  Today, pt has been ambulating in room independently. She was able to ambulate 83' in hallway but having to use rail, DOE of 3/4, and very easily fatigued.  She was on RA with sats >96% throughout. Pt with cardiopulmonary endurance significantly below her baseline.  She is expected to progress well and likely no PT needs at d/c.  Will follow in acute care to progress; also recommend continued mobility with mobility specialist. Pt currently with functional limitations due to the deficits listed below (see PT Problem List). Pt will benefit from acute skilled PT to increase their independence and safety with mobility to allow discharge.           If plan is discharge home, recommend the following: Assistance with cooking/housework;Help with stairs or ramp for entrance;A little help with walking and/or transfers;A little help with bathing/dressing/bathroom   Can travel by private vehicle        Equipment Recommendations None recommended by PT  Recommendations for Other Services       Functional Status Assessment Patient has had a recent decline in their functional status and demonstrates the ability to make significant improvements in function in a reasonable and predictable amount of time.     Precautions / Restrictions Precautions Precautions: Fall      Mobility  Bed Mobility Overal bed mobility: Independent Bed Mobility: Rolling, Supine to Sit, Sit to Supine Rolling: Independent   Supine to sit: Independent Sit to supine: Independent         Transfers Overall transfer level: Needs assistance Equipment used: None Transfers: Sit to/from Stand Sit to Stand: Modified independent (Device/Increase time)           General transfer comment: Has been ambulating to restroom on her own; uses bed rail to stand    Ambulation/Gait Ambulation/Gait assistance: Contact guard assist Gait Distance (Feet): 80 Feet Assistive device:  (hand rail 50% of time) Gait Pattern/deviations: Step-through pattern, Decreased stride length Gait velocity: decreased     General Gait Details: fatigue, used handrail, DOE of 3/4  Stairs            Wheelchair Mobility     Tilt Bed    Modified Rankin (Stroke Patients Only)       Balance Overall balance assessment: Needs assistance Sitting-balance support: No upper extremity supported Sitting balance-Leahy Scale: Good     Standing balance support: No upper extremity supported Standing balance-Leahy Scale: Good                               Pertinent Vitals/Pain Pain Assessment Pain Assessment: 0-10 Pain Score: 7  Pain Location: bil legs Pain Descriptors / Indicators: Aching, Discomfort Pain Intervention(s): Limited activity within patient's tolerance, Monitored during session, Repositioned    Home Living Family/patient expects to be discharged to:: Private residence Living Arrangements: Alone Available Help at Discharge: Friend(s);Available PRN/intermittently Type of Home: Apartment Home Access: Stairs to enter Entrance Stairs-Rails: Right Entrance Stairs-Number of Steps: 2-3   Home Layout: One level Home Equipment: Agricultural consultant (  2 wheels);Wheelchair - manual;Cane - single point      Prior Function               Mobility Comments: Used cane on stairs or when getting up/down; could ambulate in community ADLs Comments: independent     Extremity/Trunk Assessment   Upper Extremity Assessment Upper Extremity Assessment: Overall WFL for tasks  assessed    Lower Extremity Assessment Lower Extremity Assessment: Overall WFL for tasks assessed    Cervical / Trunk Assessment Cervical / Trunk Assessment: Normal  Communication      Cognition Arousal: Lethargic Behavior During Therapy: WFL for tasks assessed/performed Overall Cognitive Status: Within Functional Limits for tasks assessed                                          General Comments General comments (skin integrity, edema, etc.): Pt on RA with sats 99% rest and 96% activity but with DOE of 3/4 and easily fatigued Educated on energy conservation and rest breaks   Exercises     Assessment/Plan    PT Assessment Patient needs continued PT services  PT Problem List Cardiopulmonary status limiting activity;Decreased strength;Decreased activity tolerance;Decreased balance;Decreased mobility;Decreased knowledge of use of DME       PT Treatment Interventions DME instruction;Therapeutic exercise;Gait training;Stair training;Functional mobility training;Therapeutic activities;Patient/family education;Balance training    PT Goals (Current goals can be found in the Care Plan section)  Acute Rehab PT Goals Patient Stated Goal: return home PT Goal Formulation: With patient Time For Goal Achievement: 02/07/23 Potential to Achieve Goals: Good    Frequency Min 1X/week     Co-evaluation               AM-PAC PT "6 Clicks" Mobility  Outcome Measure Help needed turning from your back to your side while in a flat bed without using bedrails?: None Help needed moving from lying on your back to sitting on the side of a flat bed without using bedrails?: None Help needed moving to and from a bed to a chair (including a wheelchair)?: None Help needed standing up from a chair using your arms (e.g., wheelchair or bedside chair)?: None Help needed to walk in hospital room?: None Help needed climbing 3-5 steps with a railing? : A Little 6 Click Score: 23     End of Session   Activity Tolerance: Patient tolerated treatment well Patient left: in bed;with call bell/phone within reach Nurse Communication: Mobility status PT Visit Diagnosis: Other abnormalities of gait and mobility (R26.89)    Time: 1330-1340 PT Time Calculation (min) (ACUTE ONLY): 10 min   Charges:   PT Evaluation $PT Eval Low Complexity: 1 Low   PT General Charges $$ ACUTE PT VISIT: 1 Visit         Anise Salvo, PT Acute Rehab Services [facility] [phone]   Rayetta Humphrey 01/24/2023, 1:48 PM

## 2023-01-24 NOTE — Subjective & Objective (Signed)
Pt seen and examined. Pt able to ambulate without hypoxia. Pt's boyfriend Will is sleeping in bedside recliner.  Pt asking for diet to be changed to regular despite her diagnosis of DM2 and CHF. Her request for diet change was declined.

## 2023-01-24 NOTE — Assessment & Plan Note (Signed)
Remains on coreg and eliquis.

## 2023-01-24 NOTE — Progress Notes (Signed)
PROGRESS NOTE    April [name]  MWU:[id number] DOB: 13-Feb-1971 DOA: 01/23/2023 PCP: Hoy Register, MD  Subjective: Pt seen and examined. Pt able to ambulate without hypoxia. Pt's boyfriend Will is sleeping in bedside recliner.  Pt asking for diet to be changed to regular despite her diagnosis of DM2 and CHF. Her request for diet change was declined.   Hospital Course: HPI: 52 year old female with history of asthma, COPD, history of intubation/ICU admission for asthma 5 years ago, hypertension, atrial fibrillation on Eliquis, hypothyroidism HLD, presented to the ED with complaint of shortness of breath, onset Sunday and has been worsening especially today, she has associated productive cough, chest tightness and headache while coughing.She describes symptoms similar to her previous asthma attack.  She had sick contacts with her grandchildren over the weekend and she commonly has exacerbation with weather changes.  Currently she denies smoking but smoked in the past. Patient otherwise denies any nausea, vomiting, chills, focal weakness, numbness tingling, speech difficulties.   In the ED: Vitals with stable heart rate tachypnea initially 22-32, currently at 16, saturating well on room air BP in 140-180.  Labs with stable BMP, CBC with leukocytosis which is downtrending from admission leukocytosis of 15.4 in August.  COVID screening negative.  Chest x-ray no active disease.  Patient was given Solu-Medrol 125 mg, DuoNeb, azithromycin and albuterol multiple times, and admission requested for further management. She is requesting for her pain medication and sleeping medication for the night.  Significant Events: Admitted 01/23/2023 for asthma exacerbation   Significant Labs: 01-23-2023 Procalcitonin < 0.10 01-24-2023 HgBA1C 5.5% 01-23-2023 BNP 49.5  Significant Imaging Studies: Admitting CXR negative  Antibiotic Therapy: Anti-infectives (From admission, onward)    Start     Dose/Rate Route  Frequency Ordered Stop   01/25/23 0600  azithromycin (ZITHROMAX) tablet 500 mg  Status:  Discontinued       Placed in "Followed by" Linked Group   500 mg Oral Daily 01/23/23 2141 01/24/23 1434   01/24/23 0600  azithromycin (ZITHROMAX) 500 mg in sodium chloride 0.9 % 250 mL IVPB       Placed in "Followed by" Linked Group   500 mg 250 mL/hr over 60 Minutes Intravenous Every 24 hours 01/23/23 2141 01/24/23 0651   01/23/23 2045  azithromycin (ZITHROMAX) 500 mg in sodium chloride 0.9 % 250 mL IVPB        50 0 mg 250 mL/hr over 60 Minutes Intravenous  Once 01/23/23 2043 01/23/23 2157       Procedures:   Consultants:     Assessment and Plan: * Acute asthma exacerbation Admitted on 01-23-2023 for asthma exacerbation. Remains on IV solumedrol with plans to change to po prednisone tomorrow. Remains on RA even while ambulating. Plan for DC in the AM. Pt confirms she has nebulizer machine at home. Will need Rx for duonebs.  Procalcitonin is normal. Will stop zithromax.  Chronic heart failure with preserved ejection fraction (HFpEF) (HCC) Stable. Continue with GDMT.  Body mass index 50.0-59.9, adult (HCC) Pt is morbidly obese. BMI 50  Obstructive sleep apnea Pt refuses to wear CPAP.  AF (paroxysmal atrial fibrillation) (HCC) Remains on coreg and eliquis.  HTN (hypertension) Stable.  On coreg and norvasc.  Morbid obesity due to excess calories (HCC) Pt with morbid obesity with BMI 50. On ozempic by PCP. Had asked her PCP for bariatric surgery referral.  Now pt demanding to be changed to regular diet.  Her request has been refused. Discussed with pt that she is on  IV steroids which will make her retain water and elevate her CBG. I refuse to create more medical problems for her. She has enough of them.  Overnight providers, please do not change her diet. It should stay heart healthy/diabetic diet.       DVT prophylaxis:  apixaban (ELIQUIS) tablet 5 mg     Code Status: Full  Code Family Communication: discussed with pt and boyfriend Will at bedside Disposition Plan: return home Reason for continuing need for hospitalization: remains on IV steroids.  Objective: Vitals:   01/24/23 0556 01/24/23 0744 01/24/23 1130 01/24/23 1217  BP: (!) 149/73   (!) 149/65  Pulse: 83   76  Resp: (!) 24   20  Temp: (!) 97.4 F (36.3 C)   98.7 F (37.1 C)  TempSrc: Oral   Oral  SpO2: 100% 100% 100% 100%  Weight:      Height:        Intake/Output Summary (Last 24 hours) at 01/24/2023 1437 Last data filed at 01/24/2023 0631 Gross per 24 hour  Intake 166.36 ml  Output --  Net 166.36 ml   Filed Weights   01/23/23 2215  Weight: (!) 140.7 kg    Examination:  Physical Exam Vitals and nursing note reviewed.  Constitutional:      General: She is not in acute distress.    Appearance: She is obese. She is not toxic-appearing or diaphoretic.  HENT:     Head: Normocephalic and atraumatic.     Nose: Nose normal.  Eyes:     General: No scleral icterus. Cardiovascular:     Rate and Rhythm: Normal rate and regular rhythm.  Pulmonary:     Effort: Pulmonary effort is normal. No tachypnea or respiratory distress.     Breath sounds: Examination of the right-upper field reveals wheezing. Examination of the left-upper field reveals wheezing. Examination of the right-middle field reveals wheezing. Examination of the left-middle field reveals wheezing. Wheezing present.  Abdominal:     General: There is no distension.     Palpations: Abdomen is soft.     Tenderness: There is no abdominal tenderness.     Comments: Morbidly obese  Skin:    Capillary Refill: Capillary refill takes less than 2 seconds.  Neurological:     General: No focal deficit present.     Mental Status: She is oriented to person, place, and time.     Data Reviewed: I have personally reviewed following labs and imaging studies  CBC: Recent Labs  Lab 01/23/23 1707 01/24/23 0438  WBC 12.9* 11.5*   NEUTROABS 5.8  --   HGB 12.0 11.6*  HCT 39.0 39.0  MCV 86.7 90.7  PLT 448* 394   Basic Metabolic Panel: Recent Labs  Lab 01/23/23 1707 01/24/23 0438  NA 137 132*  K 3.5 4.7  CL 107 107  CO2 23 18*  GLUCOSE 88 109*  BUN 9 11  CREATININE 0.69 0.76  CALCIUM 8.5* 8.5*   GFR: Estimated Creatinine Clearance: 122.1 mL/min (by C-G formula based on SCr of 0.76 mg/dL). Liver Function Tests: Recent Labs  Lab 01/24/23 0438  AST 24  ALT 15  ALKPHOS 67  BILITOT 0.9  PROT 7.7  ALBUMIN 3.4*   HbA1C: Recent Labs    01/24/23 0438  HGBA1C 5.5   Sepsis Labs: Recent Labs  Lab 01/23/23 1707  PROCALCITON <0.10    Recent Results (from the past 240 hour(s))  SARS Coronavirus 2 by RT PCR (hospital order, performed in [facility]  hospital lab) *cepheid single result test* Anterior Nasal Swab     Status: None   Collection Time: 01/23/23  5:02 PM   Specimen: Anterior Nasal Swab  Result Value Ref Range Status   SARS Coronavirus 2 by RT PCR NEGATIVE NEGATIVE Final    Comment: (NOTE) SARS-CoV-2 target nucleic acids are NOT DETECTED.  The SARS-CoV-2 RNA is generally detectable in upper and lower respiratory specimens during the acute phase of infection. The lowest concentration of SARS-CoV-2 viral copies this assay can detect is 250 copies / mL. A negative result does not preclude SARS-CoV-2 infection and should not be used as the sole basis for treatment or other patient management decisions.  A negative result may occur with improper specimen collection / handling, submission of specimen other than nasopharyngeal swab, presence of viral mutation(s) within the areas targeted by this assay, and inadequate number of viral copies (<250 copies / mL). A negative result must be combined with clinical observations, patient history, and epidemiological information.  Fact Sheet for Patients:   RoadLapTop.co.za  Fact Sheet for Healthcare  Providers: http://kim-miller.com/  This test is not yet approved or  cleared by the [place] FDA and has been authorized for detection and/or diagnosis of SARS-CoV-2 by FDA under an Emergency Use Authorization (EUA).  This EUA will remain in effect (meaning this test can be used) for the duration of the COVID-19 declaration under Section 564(b)(1) of the Act, 21 U.S.C. section 360bbb-3(b)(1), unless the authorization is terminated or revoked sooner.  Performed at [facility], [address], [place], [place] [address]      Radiology Studies: [facility] Chest Port 1 View  Result Date: 01/23/2023 CLINICAL DATA:  Cough and shortness of breath. EXAM: PORTABLE CHEST 1 VIEW COMPARISON:  December 04, 2022 FINDINGS: The heart size and mediastinal contours are within normal limits. Both lungs are clear. The visualized skeletal structures are unremarkable. IMPRESSION: No active disease. Electronically Signed   By: Aram Candela M.D.   On: 01/23/2023 20:46    Scheduled Meds:  amLODipine  10 mg Oral Daily   apixaban  5 mg Oral BID   arformoterol  15 mcg Nebulization BID   atorvastatin  20 mg Oral Daily   budesonide (PULMICORT) nebulizer solution  0.25 mg Nebulization BID   carvedilol  6.25 mg Oral BID WC   famotidine  40 mg Oral QHS   furosemide  20 mg Oral Daily   gabapentin  300 mg Oral TID   guaiFENesin  600 mg Oral BID   levothyroxine  50 mcg Oral Q0600   methylPREDNISolone (SOLU-MEDROL) injection  80 mg Intravenous Q12H   Followed by   Melene Muller ON 01/25/2023] predniSONE  40 mg Oral Q breakfast   montelukast  10 mg Oral QHS   pantoprazole  40 mg Oral Daily   potassium chloride  20 mEq Oral Daily   pregabalin  25 mg Oral BID   revefenacin  175 mcg Nebulization Daily   topiramate  200 mg Oral QHS   venlafaxine XR  75 mg Oral Q breakfast   Continuous Infusions:   LOS: 1 day   Time spent: 35 minutes  Carollee Herter, DO  [facility]  01/24/2023, 2:37 PM

## 2023-01-24 NOTE — Plan of Care (Signed)
  Problem: Education: Goal: Knowledge of disease or condition will improve Outcome: Progressing Goal: Knowledge of the prescribed therapeutic regimen will improve Outcome: Progressing Goal: Individualized Educational Video(s) Outcome: Progressing   Problem: Activity: Goal: Ability to tolerate increased activity will improve Outcome: Progressing Goal: Will verbalize the importance of balancing activity with adequate rest periods Outcome: Progressing   Problem: Respiratory: Goal: Ability to maintain a clear airway will improve Outcome: Progressing Goal: Levels of oxygenation will improve Outcome: Progressing Goal: Ability to maintain adequate ventilation will improve Outcome: Progressing   

## 2023-01-24 NOTE — Assessment & Plan Note (Signed)
Stable. Continue with GDMT.

## 2023-01-25 DIAGNOSIS — I480 Paroxysmal atrial fibrillation: Secondary | ICD-10-CM | POA: Diagnosis not present

## 2023-01-25 DIAGNOSIS — Z6841 Body Mass Index (BMI) 40.0 and over, adult: Secondary | ICD-10-CM | POA: Diagnosis not present

## 2023-01-25 DIAGNOSIS — J4551 Severe persistent asthma with (acute) exacerbation: Secondary | ICD-10-CM | POA: Diagnosis not present

## 2023-01-25 DIAGNOSIS — I5032 Chronic diastolic (congestive) heart failure: Secondary | ICD-10-CM | POA: Diagnosis not present

## 2023-01-25 MED ORDER — MAGNESIUM SULFATE 2 GM/50ML IV SOLN
2.0000 g | Freq: Once | INTRAVENOUS | Status: AC
  Administered 2023-01-25: 2 g via INTRAVENOUS
  Filled 2023-01-25: qty 50

## 2023-01-25 NOTE — Plan of Care (Signed)
  Problem: Pain Managment: Goal: General experience of comfort will improve Outcome: Progressing   Problem: Safety: Goal: Ability to remain free from injury will improve Outcome: Progressing   Problem: Activity: Goal: Risk for activity intolerance will decrease Outcome: Progressing   Problem: Clinical Measurements: Goal: Ability to maintain clinical measurements within normal limits will improve Outcome: Progressing Goal: Will remain free from infection Outcome: Progressing Goal: Diagnostic test results will improve Outcome: Progressing Goal: Respiratory complications will improve Outcome: Progressing Goal: Cardiovascular complication will be avoided Outcome: Progressing   Problem: Respiratory: Goal: Ability to maintain a clear airway will improve Outcome: Progressing Goal: Levels of oxygenation will improve Outcome: Progressing Goal: Ability to maintain adequate ventilation will improve Outcome: Progressing   Problem: Education: Goal: Knowledge of disease or condition will improve Outcome: Progressing Goal: Knowledge of the prescribed therapeutic regimen will improve Outcome: Progressing Goal: Individualized Educational Video(s) Outcome: Progressing

## 2023-01-25 NOTE — Progress Notes (Addendum)
PROGRESS NOTE    April [name]  MWU:[id number] DOB: 1970-07-10 DOA: 01/23/2023 PCP: Hoy Register, MD  Subjective: Pt seen and examined. Pt able to ambulate without hypoxia. Pt offered discharge today as she remains on RA and is ambulating. Pt refuses discharge. States she "is not back to my baseline yet".   Hospital Course: HPI: 52 year old female with history of asthma, COPD, history of intubation/ICU admission for asthma 5 years ago, hypertension, atrial fibrillation on Eliquis, hypothyroidism HLD, presented to the ED with complaint of shortness of breath, onset Sunday and has been worsening especially today, she has associated productive cough, chest tightness and headache while coughing.She describes symptoms similar to her previous asthma attack.  She had sick contacts with her grandchildren over the weekend and she commonly has exacerbation with weather changes.  Currently she denies smoking but smoked in the past. Patient otherwise denies any nausea, vomiting, chills, focal weakness, numbness tingling, speech difficulties.   In the ED: Vitals with stable heart rate tachypnea initially 22-32, currently at 16, saturating well on room air BP in 140-180.  Labs with stable BMP, CBC with leukocytosis which is downtrending from admission leukocytosis of 15.4 in August.  COVID screening negative.  Chest x-ray no active disease.  Patient was given Solu-Medrol 125 mg, DuoNeb, azithromycin and albuterol multiple times, and admission requested for further management. She is requesting for her pain medication and sleeping medication for the night.  Significant Events: Admitted 01/23/2023 for asthma exacerbation   Significant Labs: 01-23-2023 Procalcitonin < 0.10 01-24-2023 HgBA1C 5.5% 01-23-2023 BNP 49.5  Significant Imaging Studies: Admitting CXR negative  Antibiotic Therapy: Anti-infectives (From admission, onward)    Start     Dose/Rate Route Frequency Ordered Stop   01/25/23 0600   azithromycin (ZITHROMAX) tablet 500 mg  Status:  Discontinued       Placed in "Followed by" Linked Group   500 mg Oral Daily 01/23/23 2141 01/24/23 1434   01/24/23 0600  azithromycin (ZITHROMAX) 500 mg in sodium chloride 0.9 % 250 mL IVPB       Placed in "Followed by" Linked Group   500 mg 250 mL/hr over 60 Minutes Intravenous Every 24 hours 01/23/23 2141 01/24/23 0651   01/23/23 2045  azithromycin (ZITHROMAX) 500 mg in sodium chloride 0.9 % 250 mL IVPB        50 0 mg 250 mL/hr over 60 Minutes Intravenous  Once 01/23/23 2043 01/23/23 2157       Procedures:   Consultants:     Assessment and Plan: * Acute asthma exacerbation Admitted on 01-23-2023 for asthma exacerbation. Completed IV solumedrol on 01-24-2023. Now on po prednisone. Remains on RA even while ambulating. Pt refuses to be discharged today stating she is "not back to my baseline yet".  Pt confirms she has nebulizer machine at home. Will need Rx for duonebs.  Procalcitonin is normal. Zithromax stopped on 01-24-2023.  01-25-2023 Will give 1 dose of IV magnesium to see if this helps her wheezing.    Chronic heart failure with preserved ejection fraction (HFpEF) (HCC) Stable. Continue with GDMT.  Body mass index 50.0-59.9, adult (HCC) Pt is morbidly obese. BMI 50  Obstructive sleep apnea Pt refuses to wear CPAP.  AF (paroxysmal atrial fibrillation) (HCC) Remains on coreg and eliquis.  Essential hypertension Stable.  On coreg and norvasc.  Morbid obesity due to excess calories (HCC) Pt with morbid obesity with BMI 50. On ozempic by PCP. Had asked her PCP for bariatric surgery referral.  On 01-24-2023, pt demanding  to be changed to regular diet.  Her request has been refused. Discussed with pt on 01-24-2023 that she is on steroids which will make her retain water and elevate her CBG. I refuse to create more medical problems for her. She has enough of them.  Overnight providers, please do not change her diet. It should  stay heart healthy/diabetic diet.  DVT prophylaxis:  apixaban (ELIQUIS) tablet 5 mg     Code Status: Full Code Family Communication: discussed with pt. No family at bedside Disposition Plan: return home Reason for continuing need for hospitalization: pt is medically stable for discharge. Pt refusing to be discharged.  Objective: Vitals:   01/24/23 2042 01/24/23 2138 01/25/23 0533 01/25/23 0752  BP:  (!) 161/95 132/71   Pulse:  78 75   Resp:  18 20   Temp:  98.2 F (36.8 C) 98 F (36.7 C)   TempSrc:  Oral Oral   SpO2: 98% 100% 100% 93%  Weight:      Height:        Intake/Output Summary (Last 24 hours) at 01/25/2023 1037 Last data filed at 01/24/2023 1400 Gross per 24 hour  Intake 240 ml  Output --  Net 240 ml   Filed Weights   01/23/23 2215  Weight: (!) 140.7 kg    Examination:  Physical Exam Vitals and nursing note reviewed.  Constitutional:      General: She is not in acute distress.    Appearance: She is obese. She is not toxic-appearing or diaphoretic.  HENT:     Head: Normocephalic and atraumatic.     Nose: Nose normal.  Eyes:     General: No scleral icterus. Cardiovascular:     Rate and Rhythm: Normal rate and regular rhythm.  Pulmonary:     Effort: Pulmonary effort is normal. No tachypnea or respiratory distress.     Breath sounds: Examination of the right-upper field reveals wheezing. Examination of the left-upper field reveals wheezing. Examination of the right-middle field reveals wheezing. Examination of the left-middle field reveals wheezing. Wheezing present.  Abdominal:     General: There is no distension.     Palpations: Abdomen is soft.     Tenderness: There is no abdominal tenderness.     Comments: Morbidly obese  Skin:    Capillary Refill: Capillary refill takes less than 2 seconds.  Neurological:     General: No focal deficit present.     Mental Status: She is oriented to person, place, and time.     Data Reviewed: I have personally  reviewed following labs and imaging studies  CBC: Recent Labs  Lab 01/23/23 1707 01/24/23 0438  WBC 12.9* 11.5*  NEUTROABS 5.8  --   HGB 12.0 11.6*  HCT 39.0 39.0  MCV 86.7 90.7  PLT 448* 394   Basic Metabolic Panel: Recent Labs  Lab 01/23/23 1707 01/24/23 0438  NA 137 132*  K 3.5 4.7  CL 107 107  CO2 23 18*  GLUCOSE 88 109*  BUN 9 11  CREATININE 0.69 0.76  CALCIUM 8.5* 8.5*   GFR: Estimated Creatinine Clearance: 122.1 mL/min (by C-G formula based on SCr of 0.76 mg/dL). Liver Function Tests: Recent Labs  Lab 01/24/23 0438  AST 24  ALT 15  ALKPHOS 67  BILITOT 0.9  PROT 7.7  ALBUMIN 3.4*   HbA1C: Recent Labs    01/24/23 0438  HGBA1C 5.5   Sepsis Labs: Recent Labs  Lab 01/23/23 1707  PROCALCITON <0.10    Recent Results (  from the past 240 hour(s))  SARS Coronavirus 2 by RT PCR (hospital order, performed in [facility] hospital lab) *cepheid single result test* Anterior Nasal Swab     Status: None   Collection Time: 01/23/23  5:02 PM   Specimen: Anterior Nasal Swab  Result Value Ref Range Status   SARS Coronavirus 2 by RT PCR NEGATIVE NEGATIVE Final    Comment: (NOTE) SARS-CoV-2 target nucleic acids are NOT DETECTED.  The SARS-CoV-2 RNA is generally detectable in upper and lower respiratory specimens during the acute phase of infection. The lowest concentration of SARS-CoV-2 viral copies this assay can detect is 250 copies / mL. A negative result does not preclude SARS-CoV-2 infection and should not be used as the sole basis for treatment or other patient management decisions.  A negative result may occur with improper specimen collection / handling, submission of specimen other than nasopharyngeal swab, presence of viral mutation(s) within the areas targeted by this assay, and inadequate number of viral copies (<250 copies / mL). A negative result must be combined with clinical observations, patient history, and epidemiological information.  Fact  Sheet for Patients:   RoadLapTop.co.za  Fact Sheet for Healthcare Providers: http://kim-miller.com/  This test is not yet approved or  cleared by the [place] FDA and has been authorized for detection and/or diagnosis of SARS-CoV-2 by FDA under an Emergency Use Authorization (EUA).  This EUA will remain in effect (meaning this test can be used) for the duration of the COVID-19 declaration under Section 564(b)(1) of the Act, 21 U.S.C. section 360bbb-3(b)(1), unless the authorization is terminated or revoked sooner.  Performed at [facility], [address] [place], [place] [address]      Radiology Studies: [facility] Chest Port 1 View  Result Date: 01/23/2023 CLINICAL DATA:  Cough and shortness of breath. EXAM: PORTABLE CHEST 1 VIEW COMPARISON:  December 04, 2022 FINDINGS: The heart size and mediastinal contours are within normal limits. Both lungs are clear. The visualized skeletal structures are unremarkable. IMPRESSION: No active disease. Electronically Signed   By: Aram Candela M.D.   On: 01/23/2023 20:46    Scheduled Meds:  amLODipine  10 mg Oral Daily   apixaban  5 mg Oral BID   arformoterol  15 mcg Nebulization BID   atorvastatin  20 mg Oral Daily   budesonide (PULMICORT) nebulizer solution  0.25 mg Nebulization BID   carvedilol  6.25 mg Oral BID WC   famotidine  40 mg Oral QHS   furosemide  20 mg Oral Daily   gabapentin  300 mg Oral TID   guaiFENesin  600 mg Oral BID   levothyroxine  50 mcg Oral Q0600   montelukast  10 mg Oral QHS   pantoprazole  40 mg Oral Daily   potassium chloride  20 mEq Oral Daily   predniSONE  40 mg Oral Q breakfast   pregabalin  25 mg Oral BID   revefenacin  175 mcg Nebulization Daily   topiramate  200 mg Oral QHS   venlafaxine XR  75 mg Oral Q breakfast   Continuous Infusions:   LOS: 2 days   Time spent: 40 minutes  Carollee Herter, DO  [facility]  01/25/2023, 10:37  AM

## 2023-01-25 NOTE — Progress Notes (Signed)
Pre treatment: peak flow: 210 Post treatment peak flow: 260

## 2023-01-25 NOTE — Progress Notes (Signed)
0305 Pt c/o pain in knee and requesting pain medication. Educated pt that it was 2 hours to soon for pain medication that she received it at 2300. Repositioned pt. Offered warm blanket.NADN Will continue to monitor

## 2023-01-25 NOTE — Progress Notes (Signed)
Mobility Specialist - Progress Note  Pre-mobility: 83 bpm HR, 97% SpO2 During mobility: 94 bpm HR, 97% SpO2 Post-mobility: 82 bpm HR, 99% SPO2   01/25/23 1052  Oxygen Therapy  O2 Device Room Air  Patient Activity (if Appropriate) Ambulating  Mobility  Activity Ambulated independently in hallway  Level of Assistance Modified independent, requires aide device or extra time  Assistive Device None  Distance Ambulated (ft) 70 ft  Range of Motion/Exercises Active  Activity Response Tolerated fair  Mobility Referral Yes  $Mobility charge 1 Mobility  Mobility Specialist Start Time (ACUTE ONLY) 1045  Mobility Specialist Stop Time (ACUTE ONLY) 1052  Mobility Specialist Time Calculation (min) (ACUTE ONLY) 7 min   Pt was found sitting EOB and agreeable to ambulate. Grew fatigued with session and c/o wheezing. Used hallway rail on the way back to room. Was left sitting EOB with all needs met. Call bell in reach and RN notified of session.  Billey Chang Mobility Specialist

## 2023-01-26 ENCOUNTER — Inpatient Hospital Stay (HOSPITAL_COMMUNITY): Payer: 59

## 2023-01-26 DIAGNOSIS — I480 Paroxysmal atrial fibrillation: Secondary | ICD-10-CM | POA: Diagnosis not present

## 2023-01-26 DIAGNOSIS — J4551 Severe persistent asthma with (acute) exacerbation: Secondary | ICD-10-CM | POA: Diagnosis not present

## 2023-01-26 DIAGNOSIS — Z6841 Body Mass Index (BMI) 40.0 and over, adult: Secondary | ICD-10-CM | POA: Diagnosis not present

## 2023-01-26 DIAGNOSIS — I5032 Chronic diastolic (congestive) heart failure: Secondary | ICD-10-CM | POA: Diagnosis not present

## 2023-01-26 DIAGNOSIS — E876 Hypokalemia: Secondary | ICD-10-CM

## 2023-01-26 LAB — CBC WITH DIFFERENTIAL/PLATELET
Abs Immature Granulocytes: 0.05 10*3/uL (ref 0.00–0.07)
Basophils Absolute: 0.1 10*3/uL (ref 0.0–0.1)
Basophils Relative: 0 %
Eosinophils Absolute: 0 10*3/uL (ref 0.0–0.5)
Eosinophils Relative: 0 %
HCT: 35.4 % — ABNORMAL LOW (ref 36.0–46.0)
Hemoglobin: 10.8 g/dL — ABNORMAL LOW (ref 12.0–15.0)
Immature Granulocytes: 0 %
Lymphocytes Relative: 35 %
Lymphs Abs: 5.3 10*3/uL — ABNORMAL HIGH (ref 0.7–4.0)
MCH: 26.7 pg (ref 26.0–34.0)
MCHC: 30.5 g/dL (ref 30.0–36.0)
MCV: 87.4 fL (ref 80.0–100.0)
Monocytes Absolute: 1.5 10*3/uL — ABNORMAL HIGH (ref 0.1–1.0)
Monocytes Relative: 10 %
Neutro Abs: 8.5 10*3/uL — ABNORMAL HIGH (ref 1.7–7.7)
Neutrophils Relative %: 55 %
Platelets: 385 10*3/uL (ref 150–400)
RBC: 4.05 MIL/uL (ref 3.87–5.11)
RDW: 18.8 % — ABNORMAL HIGH (ref 11.5–15.5)
WBC: 15.4 10*3/uL — ABNORMAL HIGH (ref 4.0–10.5)
nRBC: 0 % (ref 0.0–0.2)

## 2023-01-26 LAB — COMPREHENSIVE METABOLIC PANEL
ALT: 10 U/L (ref 0–44)
AST: 10 U/L — ABNORMAL LOW (ref 15–41)
Albumin: 3.1 g/dL — ABNORMAL LOW (ref 3.5–5.0)
Alkaline Phosphatase: 52 U/L (ref 38–126)
Anion gap: 8 (ref 5–15)
BUN: 27 mg/dL — ABNORMAL HIGH (ref 6–20)
CO2: 22 mmol/L (ref 22–32)
Calcium: 8.2 mg/dL — ABNORMAL LOW (ref 8.9–10.3)
Chloride: 106 mmol/L (ref 98–111)
Creatinine, Ser: 0.67 mg/dL (ref 0.44–1.00)
GFR, Estimated: >60 mL/min (ref >60)
Glucose, Bld: 105 mg/dL — ABNORMAL HIGH (ref 70–99)
Potassium: 3.3 mmol/L — ABNORMAL LOW (ref 3.5–5.1)
Sodium: 136 mmol/L (ref 135–145)
Total Bilirubin: 0.5 mg/dL (ref 0.3–1.2)
Total Protein: 6.7 g/dL (ref 6.5–8.1)

## 2023-01-26 LAB — MAGNESIUM: Magnesium: 2.2 mg/dL (ref 1.7–2.4)

## 2023-01-26 LAB — PROCALCITONIN: Procalcitonin: <0.1 ng/mL

## 2023-01-26 MED ORDER — HYDROCOD POLI-CHLORPHE POLI ER 10-8 MG/5ML PO SUER: 5.0000 mL | Freq: Two times a day (BID) | ORAL | 0 refills | Status: DC | PRN

## 2023-01-26 MED ORDER — IPRATROPIUM-ALBUTEROL 0.5-2.5 (3) MG/3ML IN SOLN: 3.0000 mL | RESPIRATORY_TRACT | 0 refills | Status: AC | PRN

## 2023-01-26 MED ORDER — HYDROCODONE BIT-HOMATROP MBR 5-1.5 MG/5ML PO SOLN: 5.0000 mL | Freq: Four times a day (QID) | ORAL | 0 refills | Status: DC | PRN

## 2023-01-26 MED ORDER — PREDNISONE 10 MG PO TABS: ORAL_TABLET | ORAL | 0 refills | Status: AC

## 2023-01-26 NOTE — Plan of Care (Signed)
  Problem: Activity: Goal: Ability to tolerate increased activity will improve Outcome: Progressing Goal: Will verbalize the importance of balancing activity with adequate rest periods Outcome: Progressing   Problem: Respiratory: Goal: Ability to maintain a clear airway will improve Outcome: Progressing Goal: Levels of oxygenation will improve Outcome: Progressing Goal: Ability to maintain adequate ventilation will improve Outcome: Progressing   Problem: Clinical Measurements: Goal: Ability to maintain clinical measurements within normal limits will improve Outcome: Progressing Goal: Will remain free from infection Outcome: Progressing Goal: Diagnostic test results will improve Outcome: Progressing Goal: Respiratory complications will improve Outcome: Progressing Goal: Cardiovascular complication will be avoided Outcome: Progressing   Problem: Activity: Goal: Risk for activity intolerance will decrease Outcome: Progressing   Problem: Nutrition: Goal: Adequate nutrition will be maintained Outcome: Progressing

## 2023-01-26 NOTE — TOC Transition Note (Signed)
Transition of Care [facility]) - CM/SW Discharge Note   Patient Details  Name: April [name] MRN: [id number] Date of Birth: [birth year]  [facility] CM/SW Contact:  Adrian Prows, RN Phone Number: 01/26/2023, 12:38 PM   Clinical Narrative:    D/C orders received; no TOC needs.   Final next level of care: Home/Self Care Barriers to Discharge: No Barriers Identified   Patient Goals and CMS Choice CMS Medicare.gov Compare Post Acute Care list provided to:: Patient Choice offered to / list presented to : Patient  Discharge Placement                         Discharge Plan and Services Additional resources added to the After Visit Summary for     Discharge Planning Services: CM Consult Post Acute Care Choice: Resumption of Svcs/PTA Provider                               Social Determinants of Health (SDOH) Interventions SDOH Screenings   Food Insecurity: No Food Insecurity (01/23/2023)  Housing: Low Risk  (01/23/2023)  Transportation Needs: No Transportation Needs (01/23/2023)  Utilities: Not At Risk (01/23/2023)  Alcohol Screen: Low Risk  (11/20/2022)  Depression (PHQ2-9): Medium Risk (11/20/2022)  Financial Resource Strain: Medium Risk (11/20/2022)  Physical Activity: Insufficiently Active (11/20/2022)  Social Connections: Socially Isolated (11/20/2022)  Stress: Stress Concern Present (11/20/2022)  Tobacco Use: Medium Risk (01/23/2023)  Health Literacy: Adequate Health Literacy (11/20/2022)     Readmission Risk Interventions    01/24/2023   11:12 AM 12/07/2022   10:59 AM 07/22/2022   11:56 AM  Readmission Risk Prevention Plan  Transportation Screening Complete Complete Complete  Medication Review Oceanographer)  Complete Complete  PCP or Specialist appointment within 3-5 days of discharge Complete Complete Complete  HRI or Home Care Consult Complete Complete Complete  SW Recovery Care/Counseling Consult Complete Complete Complete  Palliative  Care Screening Not Applicable Not Applicable Not Applicable  Skilled Nursing Facility Not Applicable Not Applicable Not Applicable

## 2023-01-26 NOTE — Progress Notes (Signed)
Nsg Discharge Note  Admit Date:  01/23/2023 Discharge date: 01/26/2023   April [name] to be D/C'd Home per MD order.  AVS completed.  Patient/caregiver able to verbalize understanding.  Discharge Medication: Allergies as of 01/26/2023       Reactions   Other Shortness Of Breath, Other (See Comments)   Seasonal allergies- Wheezing   Contrast Media [iodinated Contrast Media] Itching, Other (See Comments)   CT contrast        Medication List     STOP taking these medications    cloNIDine 0.2 MG tablet Commonly known as: CATAPRES       TAKE these medications    albuterol 108 (90 Base) MCG/ACT inhaler Commonly known as: VENTOLIN HFA Inhale 2 puffs into the lungs every 6 (six) hours as needed for wheezing or shortness of breath.   amLODipine 10 MG tablet Commonly known as: NORVASC Take 1 tablet (10 mg total) by mouth daily.   apixaban 5 MG Tabs tablet Commonly known as: ELIQUIS Take 1 tablet (5 mg total) by mouth 2 (two) times daily.   atorvastatin 20 MG tablet Commonly known as: LIPITOR Take 1 tablet (20 mg total) by mouth daily.   carvedilol 6.25 MG tablet Commonly known as: COREG Take 1 tablet (6.25 mg total) by mouth 2 (two) times daily with a meal. TAKE 1 TABLET(6.25 MG) BY MOUTH TWICE DAILY WITH A MEAL Strength: 6.25 mg   clonazePAM 0.5 MG tablet Commonly known as: KLONOPIN Take 0.5 mg by mouth 2 (two) times daily as needed for anxiety.   EPINEPHrine 0.3 mg/0.3 mL Soaj injection Commonly known as: EPI-PEN Inject 0.3 mg into the muscle as needed for anaphylaxis.   famotidine 40 MG tablet Commonly known as: PEPCID Take 40 mg by mouth at bedtime.   fluticasone 50 MCG/ACT nasal spray Commonly known as: FLONASE SHAKE LIQUID AND USE 1 SPRAY IN EACH NOSTRIL DAILY AS NEEDED FOR ALLERGIES OR RHINITIS What changed: See the new instructions.   furosemide 20 MG tablet Commonly known as: LASIX TAKE 1 TABLET(20 MG) BY MOUTH DAILY   gabapentin 300 MG  capsule Commonly known as: NEURONTIN Take 300 mg by mouth 3 (three) times daily.   HYDROcodone bit-homatropine 5-1.5 MG/5ML syrup Commonly known as: HYCODAN Take 5 mLs by mouth every 6 (six) hours as needed for cough.   ipratropium-albuterol 0.5-2.5 (3) MG/3ML Soln Commonly known as: DUONEB Take 3 mLs by nebulization every 4 (four) hours as needed (shortness of breath or wheezing). What changed:  how much to take how to take this when to take this reasons to take this additional instructions   levothyroxine 50 MCG tablet Commonly known as: SYNTHROID Take 1 tablet (50 mcg total) by mouth daily. What changed: when to take this   montelukast 10 MG tablet Commonly known as: SINGULAIR Take 1 tablet (10 mg total) by mouth at bedtime.   naloxone 4 MG/0.1ML Liqd nasal spray kit Commonly known as: NARCAN Place 1 spray into the nose once as needed (opioid overdose).   omeprazole 40 MG capsule Commonly known as: PRILOSEC Take 40 mg by mouth at bedtime.   oxyCODONE-acetaminophen 10-325 MG tablet Commonly known as: PERCOCET Take 1 tablet by mouth 5 (five) times daily as needed for pain. What changed: when to take this   Ozempic (1 MG/DOSE) 4 MG/3ML Sopn Generic drug: Semaglutide (1 MG/DOSE) Inject 1 mg into the skin every Monday. What changed: when to take this   Potassium Chloride ER 20 MEQ Tbcr Take 1  tablet by mouth daily.   predniSONE 10 MG tablet Commonly known as: DELTASONE Take 4 tablets (40 mg total) by mouth daily with breakfast for 2 days, THEN 3 tablets (30 mg total) daily with breakfast for 2 days, THEN 2 tablets (20 mg total) daily with breakfast for 2 days, THEN 1 tablet (10 mg total) daily with breakfast for 2 days. Start taking on: January 26, 2023   pregabalin 25 MG capsule Commonly known as: LYRICA Take 25 mg by mouth 2 (two) times daily.   solifenacin 5 MG tablet Commonly known as: VESIcare Take 1 tablet (5 mg total) by mouth daily. What changed:  when to take this   Tezspire 210 MG/1. Soaj Generic drug: Tezepelumab-ekko Inject 210 mg into the skin every 28 (twenty-eight) days.   topiramate 200 MG tablet Commonly known as: TOPAMAX Take 200 mg by mouth at bedtime.   Trelegy Ellipta 200-62.5-25 MCG/ACT Aepb Generic drug: Fluticasone-Umeclidin-Vilant Inhale 1 puff into the lungs daily.   venlafaxine XR 75 MG 24 hr capsule Commonly known as: Effexor XR Take 1 capsule (75 mg total) by mouth daily with breakfast. For hot flashes.   Veozah 45 MG Tabs Generic drug: Fezolinetant Take 1 tablet (45 mg total) by mouth daily.   Vitamin D (Ergocalciferol) 1.25 MG (50000 UNIT) Caps capsule Commonly known as: DRISDOL Take 50,000 Units by mouth every 7 (seven) days.   zolpidem 10 MG tablet Commonly known as: AMBIEN TAKE 1 TABLET BY MOUTH AT BEDTIME AS NEEDED FOR SLEEP What changed:  how much to take how to take this when to take this additional instructions        Discharge Assessment: Vitals:   01/26/23 0446 01/26/23 0812  BP: (!) 144/80   Pulse: 65   Resp: (!) 24   Temp: 98 F (36.7 C)   SpO2: 100% 100%   Skin clean, dry and intact without evidence of skin break down, no evidence of skin tears noted. IV catheter discontinued intact. Site without signs and symptoms of complications - no redness or edema noted at insertion site, patient denies c/o pain - only slight tenderness at site.  Dressing with slight pressure applied.  D/c Instructions-Education: Discharge instructions given to patient/family with verbalized understanding. D/c education completed with patient/family including follow up instructions, medication list, d/c activities limitations if indicated, with other d/c instructions as indicated by MD - patient able to verbalize understanding, all questions fully answered. Patient instructed to return to ED, call 911, or call MD for any changes in condition.  Patient escorted via WC, and D/C home via private  auto.  Alicianna Litchford, Tilford Pillar, RN 01/26/2023 1:24 PM

## 2023-01-26 NOTE — Discharge Summary (Addendum)
[facility] Phys[facility] Discharge Summary   Patient name: April [name]  Admit date:     01/23/2023  Discharge date: 01/26/2023  Attending Physician: Lanae Boast [[id number]]  Discharge Physician: Carollee Herter   PCP: Hoy Register, MD  Admitted From: Home  Disposition:  Home  Recommendations for Outpatient Follow-up:  Follow up with PCP as scheduled on 02-05-2023 Follow up with Dr. Iona Caloca) on 02-04-2023 as scheduled Please follow up on the following pending results:  Home Health:No Equipment/Devices: None  Discharge Condition:Stable CODE STATUS:FULL Diet recommendation: Diabetic Fluid Restriction: None  Hospital Summary: HPI: 52 year old female with history of asthma, COPD, history of intubation/ICU admission for asthma 5 years ago, hypertension, atrial fibrillation on Eliquis, hypothyroidism HLD, presented to the ED with complaint of shortness of breath, onset Sunday and has been worsening especially today, she has associated productive cough, chest tightness and headache while coughing.She describes symptoms similar to her previous asthma attack.  She had sick contacts with her grandchildren over the weekend and she commonly has exacerbation with weather changes.  Currently she denies smoking but smoked in the past. Patient otherwise denies any nausea, vomiting, chills, focal weakness, numbness tingling, speech difficulties.   In the ED: Vitals with stable heart rate tachypnea initially 22-32, currently at 16, saturating well on room air BP in 140-180.  Labs with stable BMP, CBC with leukocytosis which is downtrending from admission leukocytosis of 15.4 in August.  COVID screening negative.  Chest x-ray no active disease.  Patient was given Solu-Medrol 125 mg, DuoNeb, azithromycin and albuterol multiple times, and admission requested for further management. She is requesting for her pain medication and sleeping medication for the night.  Significant Events: Admitted  01/23/2023 for asthma exacerbation   Significant Labs: 01-23-2023 Procalcitonin < 0.10 01-24-2023 HgBA1C 5.5% 01-23-2023 BNP 49.5 01-26-2023 Procal <0.10  Significant Imaging Studies: Admitting CXR negative Discharge CXR negative  Antibiotic Therapy: Anti-infectives (From admission, onward)    Start     Dose/Rate Route Frequency Ordered Stop   01/25/23 0600  azithromycin (ZITHROMAX) tablet 500 mg  Status:  Discontinued       Placed in "Followed by" Linked Group   500 mg Oral Daily 01/23/23 2141 01/24/23 1434   01/24/23 0600  azithromycin (ZITHROMAX) 500 mg in sodium chloride 0.9 % 250 mL IVPB       Placed in "Followed by" Linked Group   500 mg 250 mL/hr over 60 Minutes Intravenous Every 24 hours 01/23/23 2141 01/24/23 0651   01/23/23 2045  azithromycin (ZITHROMAX) 500 mg in sodium chloride 0.9 % 250 mL IVPB        50 0 mg 250 mL/hr over 60 Minutes Intravenous  Once 01/23/23 2043 01/23/23 2157       Procedures:   Consultants:    Hospital Course by Problem: * Acute asthma exacerbation Admitted on 01-23-2023 for asthma exacerbation. Completed IV solumedrol on 01-24-2023. Now on po prednisone. Remains on RA even while ambulating. Pt refuses to be discharged on 01-25-2023 stating she is "not back to my baseline yet".  Pt confirms she has nebulizer machine at home. Will need Rx for duonebs.  Procalcitonin is normal on 9-26-204. Zithromax stopped on 01-24-2023.  01-25-2023 Will give 1 dose of IV magnesium to see if this helps her wheezing. 01-26-2023 pt has remained on RA during the day. Able to ambulate without any hypoxia. Has been po prednisone. Discussed with pt that she will continue on asthma exacerbation treatment for the next 8 days with prednisone and q4-6h duonebs.  She is safe for discharge to home and continued outpatient treatment for her asthma exacerbation. Prn tussionex for cough. Pt advised to call her pain clinic on Monday to inform them she has received Rx for medication  that has hydrocodone. Pt to f/u with Dr. Maple Hudson with pulmonology on 02-04-2023. Pt is getting monthly Tezspire injections. Repeat procal <0.10 and cxr negative to reassure pt that she is stable for discharge    Chronic heart failure with preserved ejection fraction (HFpEF) (HCC) Stable. Continue with GDMT.  Body mass index 50.0-59.9, adult (HCC) Pt is morbidly obese. BMI 50  Obstructive sleep apnea Pt refuses to wear CPAP.  AF (paroxysmal atrial fibrillation) (HCC) Remains on coreg and eliquis.  Essential hypertension Stable.  On coreg and norvasc.  Morbid obesity due to excess calories (HCC) Pt with morbid obesity with BMI 50. On ozempic by PCP. Had asked her PCP for bariatric surgery referral.  On 01-24-2023, pt demanding to be changed to regular diet.  Her request has been refused. Discussed with pt on 01-24-2023 that she is on steroids which will make her retain water and elevate her CBG. I refuse to create more medical problems for her. She has enough of them.  I think she morbid obesity plays a large role in her perceived DOE/SOB. Pt likely has obesity-hypoventilation syndrome. Pt would benefit surgical weight loss.  Hypokalemia Pt treated with 40 meq po kcl prior to discharge.    Discharge Diagnoses:  Principal Problem:   Acute asthma exacerbation Active Problems:   Morbid obesity due to excess calories (HCC)   Essential hypertension   AF (paroxysmal atrial fibrillation) (HCC)   Obstructive sleep apnea   Body mass index 50.0-59.9, adult (HCC)   Chronic heart failure with preserved ejection fraction (HFpEF) (HCC)   Hypokalemia   Discharge Instructions  Discharge Instructions     Call MD for:  difficulty breathing, headache or visual disturbances   Complete by: As directed    Call MD for:  extreme fatigue   Complete by: As directed    Call MD for:  persistant dizziness or light-headedness   Complete by: As directed    Call MD for:  persistant nausea and vomiting    Complete by: As directed    Call MD for:  redness, tenderness, or signs of infection (pain, swelling, redness, odor or green/yellow discharge around incision site)   Complete by: As directed    Call MD for:  temperature >100.4   Complete by: As directed    Diet - low sodium heart healthy   Complete by: As directed    Diet Carb Modified   Complete by: As directed    Discharge instructions   Complete by: As directed    1. Follow up with your primary care provider within 1-2 weeks following discharge 2. Finish your prednisone taper as instructed. 3. Give yourself nebulizer treatment at least 4 times a day and up to 6 times a day for the next 7 days. 4. Follow up with you pulmonologist(Dr. Maple Hudson) as scheduled. February 04, 2023 @ 3:30 pm 5. Avoid smoke filled room or being around people who smoke/vape. 6. Wear a mask when out in public   Increase activity slowly   Complete by: As directed       Allergies as of 01/26/2023       Reactions   Other Shortness Of Breath, Other (See Comments)   Seasonal allergies- Wheezing   Contrast Media [iodinated Contrast Media] Itching, Other (See Comments)  CT contrast        Medication List     STOP taking these medications    cloNIDine 0.2 MG tablet Commonly known as: CATAPRES       TAKE these medications    albuterol 108 (90 Base) MCG/ACT inhaler Commonly known as: VENTOLIN HFA Inhale 2 puffs into the lungs every 6 (six) hours as needed for wheezing or shortness of breath.   amLODipine 10 MG tablet Commonly known as: NORVASC Take 1 tablet (10 mg total) by mouth daily.   apixaban 5 MG Tabs tablet Commonly known as: ELIQUIS Take 1 tablet (5 mg total) by mouth 2 (two) times daily.   atorvastatin 20 MG tablet Commonly known as: LIPITOR Take 1 tablet (20 mg total) by mouth daily.   carvedilol 6.25 MG tablet Commonly known as: COREG Take 1 tablet (6.25 mg total) by mouth 2 (two) times daily with a meal. TAKE 1 TABLET(6.25 MG) BY  MOUTH TWICE DAILY WITH A MEAL Strength: 6.25 mg   clonazePAM 0.5 MG tablet Commonly known as: KLONOPIN Take 0.5 mg by mouth 2 (two) times daily as needed for anxiety.   EPINEPHrine 0.3 mg/0.3 mL Soaj injection Commonly known as: EPI-PEN Inject 0.3 mg into the muscle as needed for anaphylaxis.   famotidine 40 MG tablet Commonly known as: PEPCID Take 40 mg by mouth at bedtime.   fluticasone 50 MCG/ACT nasal spray Commonly known as: FLONASE SHAKE LIQUID AND USE 1 SPRAY IN EACH NOSTRIL DAILY AS NEEDED FOR ALLERGIES OR RHINITIS What changed: See the new instructions.   furosemide 20 MG tablet Commonly known as: LASIX TAKE 1 TABLET(20 MG) BY MOUTH DAILY   gabapentin 300 MG capsule Commonly known as: NEURONTIN Take 300 mg by mouth 3 (three) times daily.   HYDROcodone bit-homatropine 5-1.5 MG/5ML syrup Commonly known as: HYCODAN Take 5 mLs by mouth every 6 (six) hours as needed for cough.   ipratropium-albuterol 0.5-2.5 (3) MG/3ML Soln Commonly known as: DUONEB Take 3 mLs by nebulization every 4 (four) hours as needed (shortness of breath or wheezing). What changed:  how much to take how to take this when to take this reasons to take this additional instructions   levothyroxine 50 MCG tablet Commonly known as: SYNTHROID Take 1 tablet (50 mcg total) by mouth daily. What changed: when to take this   montelukast 10 MG tablet Commonly known as: SINGULAIR Take 1 tablet (10 mg total) by mouth at bedtime.   naloxone 4 MG/0.1ML Liqd nasal spray kit Commonly known as: NARCAN Place 1 spray into the nose once as needed (opioid overdose).   omeprazole 40 MG capsule Commonly known as: PRILOSEC Take 40 mg by mouth at bedtime.   oxyCODONE-acetaminophen 10-325 MG tablet Commonly known as: PERCOCET Take 1 tablet by mouth 5 (five) times daily as needed for pain. What changed: when to take this   Ozempic (1 MG/DOSE) 4 MG/3ML Sopn Generic drug: Semaglutide (1 MG/DOSE) Inject 1  mg into the skin every Monday. What changed: when to take this   Potassium Chloride ER 20 MEQ Tbcr Take 1 tablet by mouth daily.   predniSONE 10 MG tablet Commonly known as: DELTASONE Take 4 tablets (40 mg total) by mouth daily with breakfast for 2 days, THEN 3 tablets (30 mg total) daily with breakfast for 2 days, THEN 2 tablets (20 mg total) daily with breakfast for 2 days, THEN 1 tablet (10 mg total) daily with breakfast for 2 days. Start taking on: January 26, 2023  pregabalin 25 MG capsule Commonly known as: LYRICA Take 25 mg by mouth 2 (two) times daily.   solifenacin 5 MG tablet Commonly known as: VESIcare Take 1 tablet (5 mg total) by mouth daily. What changed: when to take this   Tezspire 210 MG/1. Soaj Generic drug: Tezepelumab-ekko Inject 210 mg into the skin every 28 (twenty-eight) days.   topiramate 200 MG tablet Commonly known as: TOPAMAX Take 200 mg by mouth at bedtime.   Trelegy Ellipta 200-62.5-25 MCG/ACT Aepb Generic drug: Fluticasone-Umeclidin-Vilant Inhale 1 puff into the lungs daily.   venlafaxine XR 75 MG 24 hr capsule Commonly known as: Effexor XR Take 1 capsule (75 mg total) by mouth daily with breakfast. For hot flashes.   Veozah 45 MG Tabs Generic drug: Fezolinetant Take 1 tablet (45 mg total) by mouth daily.   Vitamin D (Ergocalciferol) 1.25 MG (50000 UNIT) Caps capsule Commonly known as: DRISDOL Take 50,000 Units by mouth every 7 (seven) days.   zolpidem 10 MG tablet Commonly known as: AMBIEN TAKE 1 TABLET BY MOUTH AT BEDTIME AS NEEDED FOR SLEEP What changed:  how much to take how to take this when to take this additional instructions        Allergies  Allergen Reactions   Other Shortness Of Breath and Other (See Comments)    Seasonal allergies- Wheezing   Contrast Media [Iodinated Contrast Media] Itching and Other (See Comments)    CT contrast    Discharge Exam: Vitals:   01/26/23 0446 01/26/23 0812  BP: (!)  144/80   Pulse: 65   Resp: (!) 24   Temp: 98 F (36.7 C)   SpO2: 100% 100%    Physical Exam Vitals and nursing note reviewed.  Constitutional:      Appearance: She is obese.  HENT:     Head: Normocephalic and atraumatic.     Nose: Nose normal.  Eyes:     General: No scleral icterus. Cardiovascular:     Rate and Rhythm: Normal rate and regular rhythm.  Pulmonary:     Effort: Pulmonary effort is normal. No respiratory distress.     Breath sounds: No decreased air movement.     Comments: Good air movement. No distress  Wheezes noted in upper lobes bilaterally  No accessory muscle use. Abdominal:     General: Abdomen is protuberant.  Skin:    General: Skin is warm and dry.     Capillary Refill: Capillary refill takes less than 2 seconds.  Neurological:     General: No focal deficit present.     Mental Status: She is alert and oriented to person, place, and time.     The results of significant diagnostics from this hospitalization (including imaging, microbiology, ancillary and laboratory) are listed below for reference.    Microbiology: Recent Results (from the past 240 hour(s))  SARS Coronavirus 2 by RT PCR (hospital order, performed in [facility] hospital lab) *cepheid single result test* Anterior Nasal Swab     Status: None   Collection Time: 01/23/23  5:02 PM   Specimen: Anterior Nasal Swab  Result Value Ref Range Status   SARS Coronavirus 2 by RT PCR NEGATIVE NEGATIVE Final    Comment: (NOTE) SARS-CoV-2 target nucleic acids are NOT DETECTED.  The SARS-CoV-2 RNA is generally detectable in upper and lower respiratory specimens during the acute phase of infection. The lowest concentration of SARS-CoV-2 viral copies this assay can detect is 250 copies / mL. A negative result does not preclude SARS-CoV-2 infection  and should not be used as the sole basis for treatment or other patient management decisions.  A negative result may occur with improper specimen  collection / handling, submission of specimen other than nasopharyngeal swab, presence of viral mutation(s) within the areas targeted by this assay, and inadequate number of viral copies (<250 copies / mL). A negative result must be combined with clinical observations, patient history, and epidemiological information.  Fact Sheet for Patients:   RoadLapTop.co.za  Fact Sheet for Healthcare Providers: http://kim-miller.com/  This test is not yet approved or  cleared by the [place] FDA and has been authorized for detection and/or diagnosis of SARS-CoV-2 by FDA under an Emergency Use Authorization (EUA).  This EUA will remain in effect (meaning this test can be used) for the duration of the COVID-19 declaration under Section 564(b)(1) of the Act, 21 U.S.C. section 360bbb-3(b)(1), unless the authorization is terminated or revoked sooner.  Performed at [facility], [address], [place], [place] [address]      Labs: BNP (last 3 results) Recent Labs    04/28/22 0810 12/04/22 2332 01/23/23 1707  BNP 58.9 68.9 49.5   Basic Metabolic Panel: Recent Labs  Lab 01/23/23 1707 01/24/23 0438 01/26/23 0529  NA 137 132* 136  K 3.5 4.7 3.3*  CL 107 107 106  CO2 23 18* 22  GLUCOSE 88 109* 105*  BUN 9 11 27*  CREATININE 0.69 0.76 0.67  CALCIUM 8.5* 8.5* 8.2*  MG  --   --  2.2   Liver Function Tests: Recent Labs  Lab 01/24/23 0438 01/26/23 0529  AST 24 10*  ALT 15 10  ALKPHOS 67 52  BILITOT 0.9 0.5  PROT 7.7 6.7  ALBUMIN 3.4* 3.1*   CBC: Recent Labs  Lab 01/23/23 1707 01/24/23 0438 01/26/23 0529  WBC 12.9* 11.5* 15.4*  NEUTROABS 5.8  --  8.5*  HGB 12.0 11.6* 10.8*  HCT 39.0 39.0 35.4*  MCV 86.7 90.7 87.4  PLT 448* 394 385   Hgb A1c Recent Labs    01/24/23 0438  HGBA1C 5.5   Sepsis Labs Recent Labs  Lab 01/23/23 1707 01/24/23 0438 01/26/23 0529  WBC 12.9* 11.5* 15.4*   Recent Labs     01/23/23 1707 01/26/23 0529  PROCALCITON <0.10 <0.10    Microbiology Recent Results (from the past 240 hour(s))  SARS Coronavirus 2 by RT PCR (hospital order, performed in [facility] hospital lab) *cepheid single result test* Anterior Nasal Swab     Status: None   Collection Time: 01/23/23  5:02 PM   Specimen: Anterior Nasal Swab  Result Value Ref Range Status   SARS Coronavirus 2 by RT PCR NEGATIVE NEGATIVE Final    Comment: (NOTE) SARS-CoV-2 target nucleic acids are NOT DETECTED.  The SARS-CoV-2 RNA is generally detectable in upper and lower respiratory specimens during the acute phase of infection. The lowest concentration of SARS-CoV-2 viral copies this assay can detect is 250 copies / mL. A negative result does not preclude SARS-CoV-2 infection and should not be used as the sole basis for treatment or other patient management decisions.  A negative result may occur with improper specimen collection / handling, submission of specimen other than nasopharyngeal swab, presence of viral mutation(s) within the areas targeted by this assay, and inadequate number of viral copies (<250 copies / mL). A negative result must be combined with clinical observations, patient history, and epidemiological information.  Fact Sheet for Patients:   RoadLapTop.co.za  Fact Sheet for Healthcare  Providers: http://kim-miller.com/  This test is not yet approved or  cleared by the [place] and has been authorized for detection and/or diagnosis of SARS-CoV-2 by FDA under an Emergency Use Authorization (EUA).  This EUA will remain in effect (meaning this test can be used) for the duration of the COVID-19 declaration under Section 564(b)(1) of the Act, 21 U.S.C. section 360bbb-3(b)(1), unless the authorization is terminated or revoked sooner.  Performed at [facility], [address], [place], [place] [address]      Procedures/Studies: DG CHEST PORT 1 VIEW  Result Date: 01/26/2023 CLINICAL DATA:  Wheezing, asthma exacerbation EXAM: PORTABLE CHEST - 1 VIEW COMPARISON:  01/23/2023 FINDINGS: No new infiltrate. Heart size and mediastinal contours are within normal limits. No effusion. Visualized bones unremarkable. IMPRESSION: No acute cardiopulmonary disease. Electronically Signed   By: Corlis Leak M.D.   On: 01/26/2023 10:14   DG Chest Port 1 View  Result Date: 01/23/2023 CLINICAL DATA:  Cough and shortness of breath. EXAM: PORTABLE CHEST 1 VIEW COMPARISON:  December 04, 2022 FINDINGS: The heart size and mediastinal contours are within normal limits. Both lungs are clear. The visualized skeletal structures are unremarkable. IMPRESSION: No active disease. Electronically Signed   By: Aram Candela M.D.   On: 01/23/2023 20:46    Time coordinating discharge: 40 mins  SIGNED:  Carollee Herter, DO [facility] 01/26/23, 1:25 PM

## 2023-01-26 NOTE — Progress Notes (Signed)
0226 Pt called to desk for Neb treatment. Called RT for treatment 0253 RT came up to give pt treatment and pt was asleep snoring and eyes closed. RT did not wake pt and left

## 2023-01-26 NOTE — Assessment & Plan Note (Signed)
Pt treated with 40 meq po kcl prior to discharge.

## 2023-01-27 ENCOUNTER — Telehealth: Payer: Self-pay

## 2023-01-27 NOTE — Transitions of Care (Post Inpatient/ED Visit) (Signed)
01/27/2023  Name: April [name] MRN: [id number] DOB: 1970/12/24  Today's TOC FU Call Status: Today's TOC FU Call Status:: Successful TOC FU Call Completed TOC FU Call Complete Date: 01/27/23 Patient's Name and Date of Birth confirmed.  Transition Care Management Follow-up Telephone Call Date of Discharge: 01/26/23 Discharge Facility: [facility]) Type of Discharge: Inpatient Admission Primary Inpatient Discharge Diagnosis:: acute asthma exacerbation How have you been since you were released from the hospital?: Better Any questions or concerns?: No  Items Reviewed: Did you receive and understand the discharge instructions provided?: Yes Medications obtained,verified, and reconciled?: Partial Review Completed (She said she has all of her medications as well as a nebulizer and she did not have any questions about the med regime.  she also stated that she is to be doing nebulizer treatments at least 4 x/day, up to 6x/day/) Any new allergies since your discharge?: No Dietary orders reviewed?: No Do you have support at home?: Yes People in Home: alone Name of Support/Comfort Primary Source: She said she has people who check on her.  She stated that she receivd a call from [employer] grief support and needs to call the person back. She did say that she is doing  " alot better. "  Medications Reviewed Today: Medications Reviewed Today   Medications were not reviewed in this encounter     Home Care and Equipment/Supplies: Were Home Health Services Ordered?: No Any new equipment or medical supplies ordered?: No  Functional Questionnaire: Do you need assistance with bathing/showering or dressing?: No Do you need assistance with meal preparation?: No Do you need assistance with eating?: No Do you have difficulty maintaining continence: No Do you need assistance with getting out of bed/getting out of a chair/moving?: No Do you have difficulty managing or taking your medications?:  No  Follow up appointments reviewed: PCP Follow-up appointment confirmed?: Yes Date of PCP follow-up appointment?: 02/05/23 Follow-up Provider: [facility] Follow-up appointment confirmed?: Yes Date of Specialist follow-up appointment?: 02/04/23 Follow-Up Specialty Provider:: Dr  Maple Hudson Do you need transportation to your follow-up appointment?: No Do you understand care options if your condition(s) worsen?: Yes-patient verbalized understanding    SIGNATURE. Robyne Peers, RN

## 2023-01-29 ENCOUNTER — Telehealth: Payer: Self-pay

## 2023-01-29 NOTE — Transitions of Care (Post Inpatient/ED Visit) (Signed)
01/29/2023  Name: April [name] MRN: [id number] DOB: 05/23/1970  Today's TOC FU Call Status: Today's TOC FU Call Status:: Successful TOC FU Call Completed TOC FU Call Complete Date: 01/29/23 Patient's Name and Date of Birth confirmed.  Outreach attempt to patient to offer 30-day TOC program. Patient assessed. Declined ongoing calls.  Transition Care Management Follow-up Telephone Call Date of Discharge: 01/26/23 Discharge Facility: [facility]) Type of Discharge: Inpatient Admission Primary Inpatient Discharge Diagnosis:: "acute asthma exacerbation" How have you been since you were released from the hospital?: Better (Pt reports she is doing good-denies any SOB-taking neb txs, cough med and using inhaler as ordererd. Appetite good. No issues with elimination.) Any questions or concerns?: No  Items Reviewed: Did you receive and understand the discharge instructions provided?: Yes Medications obtained,verified, and reconciled?: Partial Review Completed Reason for Partial Mediation Review: pt reported she had all her meds-taking them as ordered and did not wish to review all meds-reviewed sx mgmt meds Any new allergies since your discharge?: No Dietary orders reviewed?: Yes Type of Diet Ordered:: low salt/heart healthy/carb modified Do you have support at home?: Yes People in Home: alone Name of Support/Comfort Primary Source: has family/friends nearby if needed  Medications Reviewed Today: Medications Reviewed Today     Reviewed by Charlyn Minerva, RN (Registered Nurse) on 01/29/23 at 1009  Med List Status: <None>   Medication Order Taking? Sig Documenting Provider Last Dose Status Informant  albuterol (VENTOLIN HFA) 108 (90 Base) MCG/ACT inhaler [id number] Yes Inhale 2 puffs into the lungs every 6 (six) hours as needed for wheezing or shortness of breath. [name], MD Taking Active Self  amLODipine (NORVASC) 10 MG tablet [id number]  Take 1 tablet (10 mg total)  by mouth daily. Hoy Register, MD  Active Self           Med Note Cyndie Chime, [facility] I   Thu Jan 23, 2023  9:29 PM)    apixaban (ELIQUIS) 5 MG TABS tablet [id number]  Take 1 tablet (5 mg total) by mouth 2 (two) times daily. Hoy Register, MD  Active Self           Med Note Cyndie Chime, [facility] I   Thu Jan 23, 2023  9:36 PM)    atorvastatin (LIPITOR) 20 MG tablet [id number]  Take 1 tablet (20 mg total) by mouth daily. Hoy Register, MD  Active Self  carvedilol (COREG) 6.25 MG tablet [id number]  Take 1 tablet (6.25 mg total) by mouth 2 (two) times daily with a meal. TAKE 1 TABLET(6.25 MG) BY MOUTH TWICE DAILY WITH A MEAL Strength: 6.25 mg Hoy Register, MD  Active Self           Med Note Cyndie Chime, [facility] I   Thu Jan 23, 2023  9:36 PM)    clonazePAM (KLONOPIN) 0.5 MG tablet [id number]  Take 0.5 mg by mouth 2 (two) times daily as needed for anxiety. [provider]  Active Self  EPINEPHrine 0.3 mg/0.3 mL IJ SOAJ injection [id number]  Inject 0.3 mg into the muscle as needed for anaphylaxis. [name], MD  Active Self           Med Note (CRUTHIS, Marcy Siren   Thu Dec 05, 2022  9:51 AM)    famotidine (PEPCID) 40 MG tablet [id number]  Take 40 mg by mouth at bedtime. [provider]  Active Self           Med Note Cyndie Chime, [facility] I   Thu Jan 23, 2023  9:30 PM)    Fezolinetant (VEOZAH) 45 MG TABS [id number]  Take 1 tablet (45 mg total) by mouth daily. Hoy Register, MD  Active Self  fluticasone (FLONASE) 50 MCG/ACT nasal spray [id number] Yes SHAKE LIQUID AND USE 1 SPRAY IN EACH NOSTRIL DAILY AS NEEDED FOR ALLERGIES OR RHINITIS  Patient taking differently: Place 1 spray into both nostrils daily.   Hoy Register, MD Taking Active Self  Fluticasone-Umeclidin-Vilant Harrel Carina ELLIPTA) 200-62.5-25 MCG/ACT AEPB [id number] Yes Inhale 1 puff into the lungs daily. Glenford Bayley, NP Taking Active Self           Med Note Cyndie Chime, [facility] I   Thu Jan 23, 2023  9:30  PM)    furosemide (LASIX) 20 MG tablet [id number]  TAKE 1 TABLET(20 MG) BY MOUTH DAILY Hoy Register, MD  Active Self           Med Note Cyndie Chime, [facility]   Thu Jan 23, 2023  9:40 PM)    gabapentin (NEURONTIN) 300 MG capsule [id number]  Take 300 mg by mouth 3 (three) times daily. [provider]  Active Self  HYDROcodone bit-homatropine (HYCODAN) 5-1.5 MG/5ML syrup [id number] Yes Take 5 mLs by mouth every 6 (six) hours as needed for cough. Carollee Herter, DO Taking Active   ipratropium-albuterol (DUONEB) 0.5-2.5 (3) MG/3ML SOLN [id number] Yes Take 3 mLs by nebulization every 4 (four) hours as needed (shortness of breath or wheezing). Carollee Herter, DO Taking Active   levothyroxine (SYNTHROID) 50 MCG tablet [id number]  Take 1 tablet (50 mcg total) by mouth daily.  Patient taking differently: Take 50 mcg by mouth daily before breakfast.   Hoy Register, MD  Active Self           Med Note Bonna Gains I   Thu Jan 23, 2023  9:40 PM)      Discontinued 08/12/19 1434 (Reorder)   montelukast (SINGULAIR) 10 MG tablet [id number]  Take 1 tablet (10 mg total) by mouth at bedtime. Hoy Register, MD  Active Self           Med Note Cyndie Chime, [facility] I   Thu Jan 23, 2023  9:31 PM)    naloxone [facility]) nasal spray 4 mg/0.1 mL [id number]  Place 1 spray into the nose once as needed (opioid overdose). [provider]  Active Self           Med Note (CRUTHIS, CHLOE C   Thu Dec 05, 2022  9:51 AM)    omeprazole (PRILOSEC) 40 MG capsule [id number]  Take 40 mg by mouth at bedtime. [provider]  Active Self  oxyCODONE-acetaminophen (PERCOCET) 10-325 MG tablet [id number]  Take 1 tablet by mouth 5 (five) times daily as needed for pain.  Patient taking differently: Take 1 tablet by mouth 4 (four) times daily.     Active Self           Med Note Marlin Canary Jan 23, 2023  9:37 PM)    Potassium Chloride ER 20 MEQ TBCR [id number]  Take 1 tablet by mouth daily. [provider]  Active Self           Med Note Cyndie Chime, [facility] I   Thu Jan 23, 2023  9:37 PM)    predniSONE (DELTASONE) 10 MG tablet [id number] Yes Take 4 tablets (40 mg total) by mouth daily with breakfast for 2 days, THEN 3 tablets (30 mg total) daily with breakfast for 2 days, THEN 2 tablets (20  mg total) daily with breakfast for 2 days, THEN 1 tablet (10 mg total) daily with breakfast for 2 days. Carollee Herter, DO Taking Active   pregabalin (LYRICA) 25 MG capsule [id number]  Take 25 mg by mouth 2 (two) times daily. [provider]  Active Self           Med Note Cyndie Chime, [facility] I   Thu Jan 23, 2023  9:37 PM)    Semaglutide, 1 MG/DOSE, (OZEMPIC, 1 MG/DOSE,) 4 MG/3ML SOPN [id number]  Inject 1 mg into the skin every Monday.  Patient taking differently: Inject 1 mg into the skin once a week.   Hoy Register, MD  Active Self           Med Note Cyndie Chime, [facility] I   Thu Jan 23, 2023  9:39 PM)    solifenacin (VESICARE) 5 MG tablet [id number]  Take 1 tablet (5 mg total) by mouth daily.  Patient taking differently: Take 5 mg by mouth every evening.   Hoy Register, MD  Active Self           Med Note Cyndie Chime, [facility] I   Thu Jan 23, 2023  9:39 PM)    Tezepelumab-ekko (TEZSPIRE) 210 MG/1. SOAJ [id number]  Inject 210 mg into the skin every 28 (twenty-eight) days. [name], MD  Active Self           Med Note Cyndie Chime, [facility] I   Thu Jan 23, 2023  9:38 PM)    topiramate (TOPAMAX) 200 MG tablet [id number]  Take 200 mg by mouth at bedtime. [provider]  Active Self           Med Note Cyndie Chime, [facility] I   Thu Jan 23, 2023  9:38 PM)    venlafaxine XR (EFFEXOR XR) 75 MG 24 hr capsule [id number]  Take 1 capsule (75 mg total) by mouth daily with breakfast. For hot flashes. Hoy Register, MD  Active Self  Vitamin D, Ergocalciferol, (DRISDOL) 1.25 MG (50000 UNIT) CAPS capsule [id number]  Take 50,000 Units by mouth every 7 (seven) days. [provider]  Active Self           Med Note Cyndie Chime, [facility] I   Thu Jan 23, 2023  9:30 PM)    zolpidem (AMBIEN) 10 MG tablet [id number]  TAKE 1 TABLET BY MOUTH AT BEDTIME AS NEEDED FOR SLEEP  Patient taking differently: Take 10 mg by mouth at bedtime.   [name], MD  Active Self           Med Note Cyndie Chime, [facility] I   Thu Jan 23, 2023  9:38 PM)              Home Care and Equipment/Supplies: Were Home Health Services Ordered?: No Any new equipment or medical supplies ordered?: No  Functional Questionnaire: Do you need assistance with bathing/showering or dressing?: No Do you need assistance with meal preparation?: No Do you need assistance with eating?: No Do you have difficulty maintaining continence: No Do you need assistance with getting out of bed/getting out of a chair/moving?: No Do you have difficulty managing or taking your medications?: No  Follow up appointments reviewed: PCP Follow-up appointment confirmed?: Yes Date of PCP follow-up appointment?: 02/05/23 Follow-up Provider: Dr. Alvis Lemmings Specialist [facility] Follow-up appointment confirmed?: Yes Date of Specialist follow-up appointment?: 02/04/23 Follow-Up Specialty Provider:: Dr. Maple Hudson Do you need transportation to your follow-up appointment?: No (pt confirms she is able to get to her appts) Do you understand  care options if your condition(s) worsen?: Yes-patient verbalized understanding  SDOH Interventions Today    Flowsheet Row Most Recent Value  SDOH Interventions   Food Insecurity Interventions Intervention Not Indicated  Transportation Interventions Intervention Not Indicated      Interventions Today    Flowsheet Row Most Recent Value  Chronic Disease   Chronic disease during today's visit Chronic Obstructive Pulmonary Disease (COPD)  General Interventions   General Interventions Discussed/Reviewed General Interventions Discussed, Doctor Visits  Doctor Visits Discussed/Reviewed  Doctor Visits Discussed, PCP, Specialist, Doctor Visits Reviewed  PCP/Specialist Visits Compliance with follow-up visit  Education Interventions   Education Provided Provided Education  Provided Verbal Education On When to see the doctor, Other, Nutrition  [resp sx mgmt]  Pharmacy Interventions   Pharmacy Dicussed/Reviewed Pharmacy Topics Discussed, Medications and their functions      TOC Interventions Today    Flowsheet Row Most Recent Value  TOC Interventions   TOC Interventions Discussed/Reviewed TOC Interventions Discussed        Antionette Fairy, RN,BSN,CCM RN Care Manager Transitions of Care  [place]  Direct Phone: [phone] Toll Free: [phone] Fax: [phone]

## 2023-01-31 ENCOUNTER — Other Ambulatory Visit: Payer: Self-pay

## 2023-01-31 NOTE — Progress Notes (Signed)
Specialty Pharmacy Refill Coordination Note  April [name] is a 52 y.o. female contacted today regarding refills of specialty medication(s) [facility]   Patient requested Courier to Provider Office   Delivery date: 02/11/23   Verified address: [employer] [address]   Medication will be filled on 02/10/23.

## 2023-02-02 NOTE — Progress Notes (Signed)
Patient ID: April [name], female    DOB: 19-Oct-1970, 52 y.o.   MRN: [id number]   HPI F former smoker followed for chronic severe asthma/ Xolair complicated by non-compliance,  OSA/ CPAP,  morbid obesity, HBP, depression, GERD, AFib/ Eliquis  NPSG 03/31/07- AHI 5.8/ hr, weight 330 lbs. She canceled planned more recent study. Office spirometry 10/24/14- severe obstructive airways disease. FVC 2.10/66%, FEV1 1.25/47%, FEV1/FVC 0.59, FEF 25-75 percent 0.64/19% Unattended HST 07/06/16- AHI 11/hour, desaturation to 74%. Office Spirometry 07/29/2016-moderately severe obstructive airways disease. FVC 2.21/68%, FEV1 1.53/57%, ratio 0.69, FEF 25-75% 0.98/34% Prolonged hosp [facility] 4/19- tracheostomy. Dupixent enrolled 10/22/2018 HST 12/26/2018- AHI 9.7/ hr, desaturation to 9.7/ hr, desaturation to 69% NPSG 05/19/22- AHI 0/ hr, desat to 95% Primary Snoring, body weight 315 lbs -------------------------------------------------------   07/09/22- 52 year old female Smoker followed for chronic severe Asthma/ COPD/Xolair, Tracheostomy at [facility] 2019, Insomnia,  complicated by noncompliance, OSA/ oral appliance, morbid Obesity, HTN, depression, GERD Covid infection, PAFib/ Eliquis, dCHF, Dyslipidemia, Depression,  Dupixent enrolled 10/22/2018 changed to [facility] 2023. O2 2L prn -Proair hfa, Trelegy  100, Singulair, Neb Brovana, Tezspire, Ambien 10, Vyvanse, Neb Brovana?, Phentermine, Clonazepam, Prednisone 20 mg daily. Body weight today- 333 lbs Covid vax-2 Phizer Flu vax- [name], NP 05/15/22-hosp f/u after hosp December for As/COPD exacerb. Apopealing to insurance to restart Tezspire, out of meds, had come off CPAP. ED 3/4- Exacerb AsBr> solumedrol, nebs, prednisone ED 3/5- Asthma NPSG 05/19/22- AHI 0/ hr, desat to 95% Primary Snoring, body weight 315 lbs Gets Tezspire injections here- says her hands shake too much for self administration. Fair control of asthma in last couple of weeks, now that she has her  meds. We emphasized calling us before running out. Sheet rock fell down in her home and she complains of mold smell. Steroid talk again. She will try reducing to 20 mg alt with 10 mg every other day. CXR 07/01/22- No active disease  02/04/23- 52 year old female Smoker followed for chronic severe Asthma/ COPD/Xolair, Chronic Hypoxic Respiratory Failure, Tracheostomy at [facility] 2019, Insomnia,  complicated by noncompliance, OSA/ oral appliance, morbid Obesity, HTN, depression, GERD Covid infection, PAFib/ Eliquis, dCHF, Dyslipidemia, Depression,  Dupixent enrolled 10/22/2018 changed to [facility] 2023. O2 2L prn -Proair hfa, Trelegy  200, Singulair, Neb Brovana, Tezspire, Ambien 10, Vyvanse, Neb Brovana?, Phentermine, Clonazepam,  Body weight today- 304 lbs Hosp 9/26-9/29- Asthma exacerb after exposed to sick grandchild. Suspected Obesity Hypoventilation. > Zith, pred. ACT score 6 Daughter recently passed away- ?seizure? Not currently on prednisone. Continues Tezspire and says it helps.. She is interested in local referral for bariatric surgery. At one point she was working with someone in [place], but apparently that fell through. We all feel that part of her respiratory distress is really obesity hypoventilation and deconditioning. Asks refill ambien for insomnia- discussed. Flu vaccine today CXR 01/26/23- 1V IMPRESSION: No acute cardiopulmonary disease.   Review of Systems-See HPI  + = positive Constitutional:   + weight loss, night sweats, fevers, chills, +fatigue, lassitude. HEENT:   No-  headaches, difficulty swallowing, tooth/dental problems, sore throat,    sneezing, itching, ear ache, +nasal congestion, post nasal drip,  CV:  No-   chest pain, orthopnea, PND, swelling in lower extremities, anasarca, dizziness, palpitations Resp: +shortness of breath with exertion or at rest.              productive cough,  + non-productive cough,  No- coughing up of blood.  No-   change in  color of mucus. +wheezing.   Skin: No-   rash or lesions. GI:  No-   heartburn, indigestion, abdominal pain, nausea, vomiting,  GU: MS:  No-   joint pain or swelling.   Neuro-     nothing unusual Psych:  No- change in mood or affect. No depression or anxiety.  No memory loss.    Objective:   Physical Exam    General- Alert, Oriented, Affect-appropriate, Distress- none acute,  + morbid  obesity,  Skin- rash-none, lesions- none, excoriation- none Lymphadenopathy- none Head- atraumatic            Eyes- Gross vision intact, PERRLA, conjunctivae clear secretions            Ears- Hearing, canals-normal            Nose- Clear, no-Septal dev, mucus, polyps, erosion, perforation.            Throat- Mallampati III , mucosa clear , drainage- none, tonsils- atrophic,                           + Missing  teeth, +upper denture plate is out                  Neck- flexible , trachea+ stoma scar, no stridor , thyroid nl, carotid no bruit Chest - symmetrical excursion , unlabored           Heart/CV- RRR , no murmur , no gallop  , no rub, nl s1 s2                           - JVD- none , edema- none, stasis changes- none, varices- none           Lung- +Diminished, . Wheeze+mild, cough-none,  dullness-none,  rub- none            Chest wall Abd-  Br/ Gen/ Rectal- Not done, not indicated Extrem- cyanosis- none, clubbing, none, atrophy- none, strength- nl Neuro- grossly intact to observation

## 2023-02-04 ENCOUNTER — Encounter: Payer: Self-pay | Admitting: Internal Medicine

## 2023-02-04 ENCOUNTER — Ambulatory Visit: Payer: 59 | Admitting: Internal Medicine

## 2023-02-04 VITALS — BP 115/79 | HR 86 | Ht 65.0 in | Wt 304.4 lb

## 2023-02-04 DIAGNOSIS — F5101 Primary insomnia: Secondary | ICD-10-CM

## 2023-02-04 DIAGNOSIS — J4489 Other specified chronic obstructive pulmonary disease: Secondary | ICD-10-CM | POA: Diagnosis not present

## 2023-02-04 DIAGNOSIS — Z9884 Bariatric surgery status: Secondary | ICD-10-CM

## 2023-02-04 DIAGNOSIS — Z23 Encounter for immunization: Secondary | ICD-10-CM | POA: Diagnosis not present

## 2023-02-04 MED ORDER — ZOLPIDEM TARTRATE 10 MG PO TABS: ORAL_TABLET | ORAL | 5 refills | Status: DC

## 2023-02-04 NOTE — Patient Instructions (Signed)
Ambien refilled  Order- referral to Dr Gaynelle Adu, [facility]   consider Bariatric Surgery  Order- flu vax standard

## 2023-02-05 ENCOUNTER — Other Ambulatory Visit: Payer: Self-pay | Admitting: Family Medicine

## 2023-02-05 ENCOUNTER — Encounter: Payer: Self-pay | Admitting: Family Medicine

## 2023-02-05 ENCOUNTER — Ambulatory Visit: Payer: 59 | Attending: Family Medicine | Admitting: Family Medicine

## 2023-02-05 VITALS — BP 129/86 | HR 75 | Ht 65.0 in | Wt 307.0 lb

## 2023-02-05 DIAGNOSIS — E1159 Type 2 diabetes mellitus with other circulatory complications: Secondary | ICD-10-CM | POA: Diagnosis not present

## 2023-02-05 DIAGNOSIS — R251 Tremor, unspecified: Secondary | ICD-10-CM

## 2023-02-05 DIAGNOSIS — N39498 Other specified urinary incontinence: Secondary | ICD-10-CM

## 2023-02-05 DIAGNOSIS — N951 Menopausal and female climacteric states: Secondary | ICD-10-CM

## 2023-02-05 DIAGNOSIS — E1169 Type 2 diabetes mellitus with other specified complication: Secondary | ICD-10-CM

## 2023-02-05 DIAGNOSIS — J4541 Moderate persistent asthma with (acute) exacerbation: Secondary | ICD-10-CM

## 2023-02-05 DIAGNOSIS — Z7984 Long term (current) use of oral hypoglycemic drugs: Secondary | ICD-10-CM

## 2023-02-05 DIAGNOSIS — I480 Paroxysmal atrial fibrillation: Secondary | ICD-10-CM | POA: Diagnosis not present

## 2023-02-05 DIAGNOSIS — I152 Hypertension secondary to endocrine disorders: Secondary | ICD-10-CM

## 2023-02-05 MED ORDER — AMLODIPINE BESYLATE 10 MG PO TABS
10.0000 mg | ORAL_TABLET | Freq: Every day | ORAL | 1 refills | Status: DC
Start: 2023-02-05 — End: 2023-11-27

## 2023-02-05 MED ORDER — CARVEDILOL 6.25 MG PO TABS
6.2500 mg | ORAL_TABLET | Freq: Two times a day (BID) | ORAL | 1 refills | Status: DC
Start: 2023-02-05 — End: 2024-03-17

## 2023-02-05 MED ORDER — APIXABAN 5 MG PO TABS
5.0000 mg | ORAL_TABLET | Freq: Two times a day (BID) | ORAL | 1 refills | Status: DC
Start: 2023-02-05 — End: 2023-08-01

## 2023-02-05 MED ORDER — RYBELSUS 3 MG PO TABS: 3.0000 mg | ORAL_TABLET | Freq: Every day | ORAL | 3 refills | Status: DC

## 2023-02-05 MED ORDER — ATORVASTATIN CALCIUM 20 MG PO TABS: 20.0000 mg | ORAL_TABLET | Freq: Every day | ORAL | 1 refills | Status: DC

## 2023-02-05 NOTE — Progress Notes (Unsigned)
Subjective:  Patient ID: April [name], female    DOB: 06-23-1970  Age: 52 y.o. MRN: [id number]  CC: Hospitalization Follow-up (Discuss ozempic/Cough at night/)   HPI April [name] is a 52 y.o. year old female with a history of morbid obesity, major depressive disorder, hypertension, COPD/ Asthma, paroxysmal atrial fibrillation obstructive sleep apnea (not compliant with CPAP) with multiple hospitalizations for asthma exacerbation/COPD , steroid-induced diabetes   Interval History: Discussed the use of AI scribe software for clinical note transcription with the patient, who gave verbal consent to proceed.  She presents for a transition of care visit after a recent hospitalization last month due to an asthma exacerbation.  COVID test was negative, chest x-ray revealed no acute cardiopulmonary disease.  She was treated with nebulizers, Solu-Medrol, azithromycin.  She was discharged on a prednisone taper and reports feeling "all right" but still experiences coughing at night and some wheezing. The patient has been taking cough medication, which was helpful, but it was only prescribed for three days. She had a follow-up visit with her pulmonologist Dr. Maple Hudson yesterday.  The patient also has diabetes and has been taking Ozempic. However, she reports having hand tremors, which make self-administration of the injection difficult. She expresses interest in switching to an oral medication.  In addition to the above, the patient is experiencing hot flashes. She has been taking medication for this, but it does not seem to be effective. The patient is unsure of the name of the medication she is taking for hot flashes.  I had prescribed Veozah which was denied by her insurance company and subsequently I switched this to Effexor.  She has been taking clonidine in addition due to the fact that her hot flashes are not controlled. She remains on Eliquis for management of her atrial fibrillation and she is doing well  on her antihypertensive.       Past Medical History:  Diagnosis Date   Acanthosis nigricans    Anxiety    Arthritis    "knees" (04/28/2017)   Asthma    Followed by Dr. Maple Hudson (pulmonology); receives every other week omalizumab injections; has frequent exacerbations   Back pain    Chronic diastolic CHF (congestive heart failure) (HCC) 01/17/2017   COPD (chronic obstructive pulmonary disease) (HCC)    PFTs in 2002, FEV1/FVC 65, no post bronchodilater test done   Depression    GERD (gastroesophageal reflux disease)    Headache(784.0)    "q couple days" (04/28/2017)   Helicobacter pylori (H. pylori) infection    Hypertension, essential    Insomnia    Joint pain    Lower extremity edema    Menorrhagia    Morbid obesity (HCC)    OSA on CPAP    Sleep study 2008 - mild OSA, not enough events to titrate CPAP; wears CPAP now/pt on 04/28/2017   Pneumonia X 1   Prediabetes    Rheumatoid arthritis (HCC)    Seasonal allergies    Shortness of breath    Tobacco user    Vitamin D deficiency     Past Surgical History:  Procedure Laterality Date   CARDIOVERSION N/A 05/30/2017   Procedure: CARDIOVERSION;  Surgeon: Thurmon Fair, MD;  Location: [facility];  Service: Cardiovascular;  Laterality: N/A;   REDUCTION MAMMAPLASTY Bilateral 09/2011   TUBAL LIGATION  1996   bilateral    Family History  Problem Relation Age of Onset   Hypertension Mother    Asthma Daughter    Cancer Paternal  Aunt    Asthma Maternal Grandmother     Social History   Socioeconomic History   Marital status: Single    Spouse name: Not on file   Number of children: 1   Years of education: Not on file   Highest education level: Not on file  Occupational History   Occupation: stay at home  Tobacco Use   Smoking status: Former    Current packs/day: 0.00    Average packs/day: 0.5 packs/day for 26.0 years (13.0 ttl pk-yrs)    Types: Cigarettes    Start date: 09/11/1988    Quit date: 09/12/2014    Years  since quitting: 8.4   Smokeless tobacco: Never   Tobacco comments:    Has vaped before, caught in the hospital  Vaping Use   Vaping status: Never Used  Substance and Sexual Activity   Alcohol use: No   Drug use: No   Sexual activity: Never  Other Topics Concern   Not on file  Social History Narrative   Daughters are grown, not married, works as a Lawyer.   Social Determinants of Health   Financial Resource Strain: Medium Risk (11/20/2022)   Overall Financial Resource Strain (CARDIA)    Difficulty of Paying Living Expenses: Somewhat hard  Food Insecurity: No Food Insecurity (01/29/2023)   Hunger Vital Sign    Worried About Running Out of Food in the Last Year: Never true    Ran Out of Food in the Last Year: Never true  Transportation Needs: No Transportation Needs (01/29/2023)   PRAPARE - Administrator, Civil Service (Medical): No    Lack of Transportation (Non-Medical): No  Physical Activity: Insufficiently Active (11/20/2022)   Exercise Vital Sign    Days of Exercise per Week: 3 days    Minutes of Exercise per Session: 30 min  Stress: Stress Concern Present (11/20/2022)   [employer] of Occupational Health - Occupational Stress Questionnaire    Feeling of Stress : To some extent  Social Connections: Socially Isolated (11/20/2022)   Social Connection and Isolation Panel [NHANES]    Frequency of Communication with Friends and Family: More than three times a week    Frequency of Social Gatherings with Friends and Family: Three times a week    Attends Religious Services: Never    Active Member of Clubs or Organizations: No    Attends Banker Meetings: Never    Marital Status: Never married    Allergies  Allergen Reactions   Other Shortness Of Breath and Other (See Comments)    Seasonal allergies- Wheezing   Contrast Media [Iodinated Contrast Media] Itching and Other (See Comments)    CT contrast    Outpatient Medications Prior to Visit   Medication Sig Dispense Refill   albuterol (VENTOLIN HFA) 108 (90 Base) MCG/ACT inhaler Inhale 2 puffs into the lungs every 6 (six) hours as needed for wheezing or shortness of breath. 8.5 g 3   clonazePAM (KLONOPIN) 0.5 MG tablet Take 0.5 mg by mouth 2 (two) times daily as needed for anxiety.     EPINEPHrine 0.3 mg/0.3 mL IJ SOAJ injection Inject 0.3 mg into the muscle as needed for anaphylaxis. 1 each 5   famotidine (PEPCID) 40 MG tablet Take 40 mg by mouth at bedtime.     Fezolinetant (VEOZAH) 45 MG TABS Take 1 tablet (45 mg total) by mouth daily. 90 tablet 1   fluticasone (FLONASE) 50 MCG/ACT nasal spray SHAKE LIQUID AND USE 1 SPRAY IN [facility]  NOSTRIL DAILY AS NEEDED FOR ALLERGIES OR RHINITIS (Patient taking differently: Place 1 spray into both nostrils daily.) 16 g 0   Fluticasone-Umeclidin-Vilant (TRELEGY ELLIPTA) 200-62.5-25 MCG/ACT AEPB Inhale 1 puff into the lungs daily. 60 each 5   furosemide (LASIX) 20 MG tablet TAKE 1 TABLET(20 MG) BY MOUTH DAILY 90 tablet 0   gabapentin (NEURONTIN) 300 MG capsule Take 300 mg by mouth 3 (three) times daily.     HYDROcodone bit-homatropine (HYCODAN) 5-1.5 MG/5ML syrup Take 5 mLs by mouth every 6 (six) hours as needed for cough. 120 mL 0   ipratropium-albuterol (DUONEB) 0.5-2.5 (3) MG/3ML SOLN Take 3 mLs by nebulization every 4 (four) hours as needed (shortness of breath or wheezing). 360 mL 0   levothyroxine (SYNTHROID) 50 MCG tablet Take 1 tablet (50 mcg total) by mouth daily. (Patient taking differently: Take 50 mcg by mouth daily before breakfast.) 90 tablet 1   montelukast (SINGULAIR) 10 MG tablet Take 1 tablet (10 mg total) by mouth at bedtime. 90 tablet 1   naloxone (NARCAN) nasal spray 4 mg/0.1 mL Place 1 spray into the nose once as needed (opioid overdose).     omeprazole (PRILOSEC) 40 MG capsule Take 40 mg by mouth at bedtime.     oxyCODONE-acetaminophen (PERCOCET) 10-325 MG tablet Take 1 tablet by mouth 5 (five) times daily as needed for pain.  (Patient taking differently: Take 1 tablet by mouth 4 (four) times daily.) 150 tablet 0   Potassium Chloride ER 20 MEQ TBCR Take 1 tablet by mouth daily.     pregabalin (LYRICA) 25 MG capsule Take 25 mg by mouth 2 (two) times daily.     solifenacin (VESICARE) 5 MG tablet TAKE 1 TABLET(5 MG) BY MOUTH DAILY 90 tablet 1   Tezepelumab-ekko (TEZSPIRE) 210 MG/1. SOAJ Inject 210 mg into the skin every 28 (twenty-eight) days. 1.91 mL 5   topiramate (TOPAMAX) 200 MG tablet Take 200 mg by mouth at bedtime.     venlafaxine XR (EFFEXOR XR) 75 MG 24 hr capsule Take 1 capsule (75 mg total) by mouth daily with breakfast. For hot flashes. 30 capsule 1   Vitamin D, Ergocalciferol, (DRISDOL) 1.25 MG (50000 UNIT) CAPS capsule Take 50,000 Units by mouth every 7 (seven) days.     zolpidem (AMBIEN) 10 MG tablet TAKE 1 TABLET BY MOUTH AT BEDTIME AS NEEDED FOR SLEEP 30 tablet 5   amLODipine (NORVASC) 10 MG tablet Take 1 tablet (10 mg total) by mouth daily. 90 tablet 1   apixaban (ELIQUIS) 5 MG TABS tablet Take 1 tablet (5 mg total) by mouth 2 (two) times daily. 180 tablet 1   atorvastatin (LIPITOR) 20 MG tablet Take 1 tablet (20 mg total) by mouth daily. 90 tablet 1   carvedilol (COREG) 6.25 MG tablet Take 1 tablet (6.25 mg total) by mouth 2 (two) times daily with a meal. TAKE 1 TABLET(6.25 MG) BY MOUTH TWICE DAILY WITH A MEAL Strength: 6.25 mg 180 tablet 1   Semaglutide, 1 MG/DOSE, (OZEMPIC, 1 MG/DOSE,) 4 MG/3ML SOPN Inject 1 mg into the skin every Monday. (Patient taking differently: Inject 1 mg into the skin once a week.) 2 mL 3   Facility-Administered Medications Prior to Visit  Medication Dose Route Frequency Provider Last Rate Last Admin   tezepelumab-ekko (TEZSPIRE) 210 MG/1. syringe 210 mg  210 mg Subcutaneous Once Jetty Duhamel D, MD       tezepelumab-ekko (TEZSPIRE) 210 MG/1. syringe 210 mg  210 mg Subcutaneous Once [name], MD  ROS Review of Systems  Constitutional:  Negative  for activity change and appetite change.  HENT:  Negative for sinus pressure and sore throat.   Respiratory:  Positive for cough and wheezing. Negative for chest tightness and shortness of breath.   Cardiovascular:  Negative for chest pain and palpitations.  Gastrointestinal:  Negative for abdominal distention, abdominal pain and constipation.  Genitourinary: Negative.   Musculoskeletal: Negative.   Neurological:  Positive for tremors.  Psychiatric/Behavioral:  Negative for behavioral problems and dysphoric mood.     Objective:  BP 129/86   Pulse 75   Ht 5\' 5"  (1.651 m)   Wt (!) 307 lb (139.3 kg)   LMP 01/07/2023 (Exact Date)   SpO2 99%   BMI 51.09 kg/m      02/05/2023    1:35 PM 02/04/2023    3:19 PM 01/26/2023    4:46 AM  BP/Weight  Systolic BP 129 115 144  Diastolic BP 86 79 80  Wt. (Lbs) 307 304.4   BMI 51.09 kg/m2 50.65 kg/m2       Physical Exam Constitutional:      Appearance: She is well-developed. She is obese.  Cardiovascular:     Rate and Rhythm: Normal rate.     Heart sounds: Normal heart sounds. No murmur heard. Pulmonary:     Effort: Pulmonary effort is normal.     Breath sounds: Wheezing present. No rales.  Chest:     Chest wall: No tenderness.  Abdominal:     General: Bowel sounds are normal. There is no distension.     Palpations: Abdomen is soft. There is no mass.     Tenderness: There is no abdominal tenderness.  Musculoskeletal:        General: Normal range of motion.     Right lower leg: No edema.     Left lower leg: No edema.  Neurological:     Mental Status: She is alert and oriented to person, place, and time.  Psychiatric:        Mood and Affect: Mood normal.        Latest Ref Rng & Units 01/26/2023    5:29 AM 01/24/2023    4:38 AM 01/23/2023    5:07 PM  CMP  Glucose 70 - 99 mg/dL 161  096  88   BUN 6 - 20 mg/dL 27  11  9    Creatinine 0.44 - 1.00 mg/dL 0.45  4.09  8.11   Sodium 135 - 145 mmol/L 136  132  137   Potassium 3.5 -  5.1 mmol/L 3.3  4.7  3.5   Chloride 98 - 111 mmol/L 106  107  107   CO2 22 - 32 mmol/L 22  18  23    Calcium 8.9 - 10.3 mg/dL 8.2  8.5  8.5   Total Protein 6.5 - 8.1 g/dL 6.7  7.7    Total Bilirubin 0.3 - 1.2 mg/dL 0.5  0.9    Alkaline Phos 38 - 126 U/L 52  67    AST 15 - 41 U/L 10  24    ALT 0 - 44 U/L 10  15      Lipid Panel     Component Value Date/Time   CHOL 192 10/16/2021 0506   CHOL 238 (H) 03/30/2020 1128   TRIG 51 10/16/2021 0506   HDL 99 10/16/2021 0506   HDL 45 03/30/2020 1128   CHOLHDL 1.9 10/16/2021 0506   VLDL 10 10/16/2021 0506   LDLCALC 83 10/16/2021  0506   LDLCALC 167 (H) 03/30/2020 1128    CBC    Component Value Date/Time   WBC 15.4 (H) 01/26/2023 0529   RBC 4.05 01/26/2023 0529   HGB 10.8 (L) 01/26/2023 0529   HGB 10.4 (L) 01/22/2018 1403   HCT 35.4 (L) 01/26/2023 0529   HCT 33.3 (L) 01/22/2018 1403   PLT 385 01/26/2023 0529   PLT 616 (H) 01/22/2018 1403   MCV 87.4 01/26/2023 0529   MCV 82 01/22/2018 1403   MCH 26.7 01/26/2023 0529   MCHC 30.5 01/26/2023 0529   RDW 18.8 (H) 01/26/2023 0529   RDW 17.1 (H) 01/22/2018 1403   LYMPHSABS 5.3 (H) 01/26/2023 0529   LYMPHSABS 3.3 (H) 01/22/2018 1403   MONOABS 1.5 (H) 01/26/2023 0529   EOSABS 0.0 01/26/2023 0529   EOSABS 0.3 01/22/2018 1403   BASOSABS 0.1 01/26/2023 0529   BASOSABS 0.1 01/22/2018 1403    Lab Results  Component Value Date   HGBA1C 5.5 01/24/2023    Lab Results  Component Value Date   TSH 2.870 08/21/2022    Assessment & Plan:      Asthma Recurrent hospitalizations for asthma exacerbation Recent exacerbation requiring hospitalization and prednisone taper. Currently experiencing nocturnal cough and mild wheezing. She is on Tezspire and prednisone -Continue current asthma management plan per pulmonary  Type 2 Diabetes Difficulty self-administering Ozempic due to hand tremors. Last dose taken several weeks ago. -Discontinue Ozempic. -Prescribe Rybelsus as an oral  alternative, pending insurance approval. -Counseled on Diabetic diet, my plate method, 784 minutes of moderate intensity exercise/week Blood sugar logs with fasting goals of 80-120 mg/dl, random of less than 696 and in the event of sugars less than 60 mg/dl or greater than 295 mg/dl encouraged to notify the clinic. Advised on the need for annual eye exams, annual foot exams, Pneumonia vaccine.   Menopausal Symptoms Persistent hot flashes . -Continue Veozah for hot flashes. -Clonidine can be taken in conjunction with Veozah for hot flashes if needed.  Atrial fibrillation -Continue Eliquis -Carvedilol for rate control       Meds ordered this encounter  Medications   Semaglutide (RYBELSUS) 3 MG TABS    Sig: Take 1 tablet (3 mg total) by mouth daily.    Dispense:  30 tablet    Refill:  3    Discontinue Ozempic due to tremors   amLODipine (NORVASC) 10 MG tablet    Sig: Take 1 tablet (10 mg total) by mouth daily.    Dispense:  90 tablet    Refill:  1   apixaban (ELIQUIS) 5 MG TABS tablet    Sig: Take 1 tablet (5 mg total) by mouth 2 (two) times daily.    Dispense:  180 tablet    Refill:  1   atorvastatin (LIPITOR) 20 MG tablet    Sig: Take 1 tablet (20 mg total) by mouth daily.    Dispense:  90 tablet    Refill:  1   carvedilol (COREG) 6.25 MG tablet    Sig: Take 1 tablet (6.25 mg total) by mouth 2 (two) times daily with a meal.    Dispense:  180 tablet    Refill:  1   Return in about 3 months (around 05/08/2023) for Chronic medical conditions.       Hoy Register, MD, FAAFP. [facility] [place], [place] [phone]   02/05/2023, 2:07 PM

## 2023-02-05 NOTE — Patient Instructions (Signed)
 Managing Hot Flashes During Menopause You will learn what hot flashes/night sweats are, what causes them and what the treatment methods are. To view the content, go to this web address: [email or web address]  This video will expire on: 10/20/2024. If you need access to this video following this date, please reach out to the healthcare provider who assigned it to you. This information is not intended to replace advice given to you by your health care provider. Make sure you discuss any questions you have with your health care provider. Elsevier Patient Education  2024 [employer].

## 2023-02-06 ENCOUNTER — Telehealth: Payer: Self-pay | Admitting: Internal Medicine

## 2023-02-06 NOTE — Telephone Encounter (Signed)
PT needs Pred because she is still not well, she said. Because she is compromised Dr. Maple Hudson usually prescribes.   She has been on a taper but got sick again. Recent hospital/   [facility] on [address]      Her # is [phone]

## 2023-02-06 NOTE — Telephone Encounter (Signed)
Prednisone 10 mg, # 60, 2 daily    ref x 2

## 2023-02-06 NOTE — Telephone Encounter (Signed)
Called and spoke with the pt  She is c/o increased cough and wheezing the past 2 days  She was here for visit with Dr. Maple Hudson on 02/04/23  She states that she is not coughing up anything  She is not running a fever or having new body aches  She is requesting more pred  Please advise, thanks!  Allergies  Allergen Reactions   Other Shortness Of Breath and Other (See Comments)    Seasonal allergies- Wheezing   Contrast Media [Iodinated Contrast Media] Itching and Other (See Comments)    CT contrast

## 2023-02-07 MED ORDER — PREDNISONE 10 MG PO TABS: 20.0000 mg | ORAL_TABLET | Freq: Every day | ORAL | 2 refills | Status: DC

## 2023-02-07 NOTE — Telephone Encounter (Signed)
Rx was sent  I spoke with the pt and notified of response per Dr Maple Hudson  She verbalized understanding  Nothing further needed

## 2023-02-10 ENCOUNTER — Other Ambulatory Visit: Payer: Self-pay | Admitting: Family Medicine

## 2023-02-10 ENCOUNTER — Other Ambulatory Visit: Payer: Self-pay

## 2023-02-11 NOTE — Telephone Encounter (Signed)
Requested medication (s) are due for refill today:   Yes  Requested medication (s) are on the active medication list:   Yes  Future visit scheduled:   Yes 10/28 with Dr. Alvis Lemmings   Last ordered: 12/10/2022 #30, 1 refill  Unable to refill because labs are due per protocol   Requested Prescriptions  Pending Prescriptions Disp Refills   venlafaxine XR (EFFEXOR-XR) 75 MG 24 hr capsule [Pharmacy Med Name: VENLAFAXINE ER 75MG  CAPSULES] 30 capsule 1    Sig: TAKE 1 CAPSULE(75 MG) BY MOUTH DAILY WITH BREAKFAST FOR HOT FLASHES     Psychiatry: Antidepressants - SNRI - desvenlafaxine & venlafaxine Failed - 02/10/2023  5:19 PM      Failed - Lipid Panel in normal range within the last 12 months    Cholesterol, Total  Date Value Ref Range Status  03/30/2020 238 (H) 100 - 199 mg/dL Final   Cholesterol  Date Value Ref Range Status  10/16/2021 192 0 - 200 mg/dL Final   LDL Chol Calc (NIH)  Date Value Ref Range Status  03/30/2020 167 (H) 0 - 99 mg/dL Final   LDL Cholesterol  Date Value Ref Range Status  10/16/2021 83 0 - 99 mg/dL Final    Comment:           Total Cholesterol/HDL:CHD Risk Coronary Heart Disease Risk Table                     Men   Women  1/2 Average Risk   3.4   3.3  Average Risk       5.0   4.4  2 X Average Risk   9.6   7.1  3 X Average Risk  23.4   11.0        Use the calculated Patient Ratio above and the CHD Risk Table to determine the patient's CHD Risk.        ATP III CLASSIFICATION (LDL):  <100     mg/dL   Optimal  161-096  mg/dL   Near or Above                    Optimal  130-159  mg/dL   Borderline  045-409  mg/dL   High  >811     mg/dL   Very High Performed at [facility], [address] [place], [place] [address]    HDL  Date Value Ref Range Status  10/16/2021 99 >40 mg/dL Final  29/56/2130 45 >86 mg/dL Final   Triglycerides  Date Value Ref Range Status  10/16/2021 51 <150 mg/dL Final         Passed - Cr in normal range  and within 360 days    Creat  Date Value Ref Range Status  05/21/2016 0.56 0.50 - 1.10 mg/dL Final   Creatinine, Ser  Date Value Ref Range Status  01/26/2023 0.67 0.44 - 1.00 mg/dL Final         Passed - Completed PHQ-2 or PHQ-9 in the last 360 days      Passed - Last BP in normal range    BP Readings from Last 1 Encounters:  02/05/23 129/86         Passed - Valid encounter within last 6 months    Recent Outpatient Visits           6 days ago Tremor   [place] [facility] Hoy Register, MD   2 months ago Type  2 diabetes mellitus with other specified complication, without long-term current use of insulin (HCC)   [place] [facility] Hoy Register, MD   2 months ago Encounter for medication review and counseling   [facility] Garten, Coy L, RPH-CPP   2 months ago Type 2 diabetes mellitus with other specified complication, without long-term current use of insulin [facility])   [place] [facility] Hoy Register, MD   5 months ago Other specified hypothyroidism   [place] [facility] Hoy Register, MD       Future Appointments             In 1 week Hoy Register, MD [facility]

## 2023-02-17 ENCOUNTER — Ambulatory Visit: Payer: 59 | Admitting: *Deleted

## 2023-02-17 VITALS — BP 120/69 | HR 79 | Temp 97.7°F | Resp 16 | Ht 66.0 in | Wt 303.2 lb

## 2023-02-17 DIAGNOSIS — J4489 Other specified chronic obstructive pulmonary disease: Secondary | ICD-10-CM

## 2023-02-17 DIAGNOSIS — J4552 Severe persistent asthma with status asthmaticus: Secondary | ICD-10-CM | POA: Diagnosis not present

## 2023-02-17 MED ORDER — TEZEPELUMAB-EKKO 210 MG/1.91ML ~~LOC~~ SOSY
210.0000 mg | PREFILLED_SYRINGE | Freq: Once | SUBCUTANEOUS | Status: AC
  Administered 2023-02-17: 210 mg via SUBCUTANEOUS

## 2023-02-17 NOTE — Progress Notes (Signed)
 Diagnosis: Asthma  Provider:  Chilton Greathouse MD  Procedure: Injection  Tezspire (Tezepelumab), Dose: 210 mg, Site: subcutaneous, Number of injections: 1  Post Care: Observation period completed  Discharge: Condition: Good, Destination: Home . AVS Declined  Performed by:  Forrest Moron, RN

## 2023-02-23 ENCOUNTER — Encounter: Payer: Self-pay | Admitting: Internal Medicine

## 2023-02-23 NOTE — Assessment & Plan Note (Signed)
We are referring to [facility] to explore bariatric surgery

## 2023-02-23 NOTE — Assessment & Plan Note (Signed)
Currently controlled but labile. Not all exacerbations are bronchospasm Plan- continue Tezspire and routine meds.

## 2023-02-23 NOTE — Assessment & Plan Note (Signed)
Discussed sleep hygiene and medication safety Plan- refill Remus Loffler

## 2023-02-24 ENCOUNTER — Ambulatory Visit: Payer: Self-pay | Admitting: Family Medicine

## 2023-02-25 ENCOUNTER — Other Ambulatory Visit: Payer: Self-pay

## 2023-02-26 ENCOUNTER — Telehealth: Payer: Self-pay

## 2023-02-26 ENCOUNTER — Other Ambulatory Visit: Payer: Self-pay

## 2023-02-26 NOTE — Telephone Encounter (Signed)
Pharmacy Patient Advocate Encounter  Received notification from [facility] that Prior Authorization for [facility] has been APPROVED from 02/25/2023 to 04/28/2024   PA #/Case ID/Reference #: [id number]

## 2023-03-06 ENCOUNTER — Telehealth: Payer: Self-pay

## 2023-03-06 ENCOUNTER — Other Ambulatory Visit: Payer: Self-pay

## 2023-03-06 NOTE — Telephone Encounter (Signed)
Copied from CRM [id number]. Topic: General - Other >> Mar 06, 2023  2:01 PM Phill Myron wrote: Attn: Erskine Squibb  Please call Patient declined to leave information, other than it is confidential

## 2023-03-06 NOTE — Progress Notes (Signed)
Specialty Pharmacy Refill Coordination Note  April [name] is a 52 y.o. female contacted today regarding refills of specialty medication(s) [facility]   Patient requested Courier to Provider Office   Delivery date: 03/13/23   Verified address: [employer] [address]   Medication will be filled on 03/12/23.

## 2023-03-07 NOTE — Telephone Encounter (Signed)
I spoke to the patient and she explained that she has not been able to see her grandchildren since her daughter died and she is not sure of what she can do.  The grandchildren's other grandmother has been blocking her attempts to see the children and the patient is not sure if she should contact Legal Aid.  I told her that the best place to start for guidance would be to contact the [facility] and I provided her with their phone number.  I also explained to her that they work with Legal Aid in certain situations.  I asked her to call me back and let me know what she is able to find out from the [facility].   She then explained that she went to the first weight loss seminar last night and still needs to go through the paperwork they gave her and she will drop off at the clinic anything that needs Dr Baxter Flattery signature/ review.

## 2023-03-12 ENCOUNTER — Other Ambulatory Visit: Payer: Self-pay

## 2023-03-12 NOTE — Telephone Encounter (Signed)
Call returned to patient. She explained that she has paperwork from the weight management seminar and she needs Dr. Alvis Lemmings to review it.  She said that she has a number for [employer] for a PA: [phone] and the PCP needs to get the PA.   I told her that Dr Alvis Lemmings will need to review the paperwork before proceeding with obtaining a PA. She said that the paperwork is supposed to be returned in 1 week and that is by Friday. I explained to her that most likely it will not be reviewed  by Friday. She needs to drop the documents off at the clinic for Dr Alvis Lemmings and she said she plans to do that tomorrow, Thursday, 03/13/2023. I told her that I can;t give her a time frame for when Dr Alvis Lemmings would be able to review it.

## 2023-03-12 NOTE — Telephone Encounter (Signed)
Pt is calling back to speak with Erskine Squibb. Per pt she is really needing to speak to Erskine Squibb today before it gets too late. Pt states that she  has to bring some paper work to the office today and need to speak with Erskine Squibb before she comes to the office today. Pt states that it is confidential and is only wanting to speak with Erskine Squibb.  Please call pt back [phone].

## 2023-03-14 ENCOUNTER — Encounter: Payer: Self-pay | Admitting: Family Medicine

## 2023-03-17 ENCOUNTER — Ambulatory Visit: Payer: 59

## 2023-03-17 VITALS — BP 124/89 | HR 81 | Temp 97.7°F | Resp 20 | Ht 66.0 in | Wt 317.8 lb

## 2023-03-17 DIAGNOSIS — J4552 Severe persistent asthma with status asthmaticus: Secondary | ICD-10-CM

## 2023-03-17 DIAGNOSIS — J4489 Other specified chronic obstructive pulmonary disease: Secondary | ICD-10-CM | POA: Diagnosis not present

## 2023-03-17 MED ORDER — TEZEPELUMAB-EKKO 210 MG/1.91ML ~~LOC~~ SOSY
210.0000 mg | PREFILLED_SYRINGE | Freq: Once | SUBCUTANEOUS | Status: AC
  Administered 2023-03-17: 210 mg via SUBCUTANEOUS
  Filled 2023-03-17: qty 1.91

## 2023-03-17 NOTE — Progress Notes (Signed)
Diagnosis: Asthma  Provider:  Chilton Greathouse MD  Procedure: Injection  Tezspire (Tezepelumab), Dose: 210 mg, Site: subcutaneous, Number of injections: 1   Discharge: Condition: Good, Destination: Home . AVS Provided  Performed by:  Nat Math, RN

## 2023-03-19 ENCOUNTER — Ambulatory Visit: Payer: 59 | Admitting: Orthopaedic Surgery

## 2023-03-26 ENCOUNTER — Telehealth: Payer: Self-pay

## 2023-03-26 NOTE — Telephone Encounter (Signed)
Message received that the patient would like me to call her.   I returned her call and she said that she picked up the paperwork that Dr Alvis Lemmings completed for her bariatric surgery and she dropped it off at [facility].   She  contacted the surgeon and the insurance company and was told that they are waiting for a PA. I asked if the surgeon was obtaining the PA and she said no.  I told her that I would need to check with the surgeon's office next week to confirm who will obtain the PA.

## 2023-04-01 ENCOUNTER — Ambulatory Visit (INDEPENDENT_AMBULATORY_CARE_PROVIDER_SITE_OTHER): Payer: 59 | Admitting: Orthopaedic Surgery

## 2023-04-01 DIAGNOSIS — M1712 Unilateral primary osteoarthritis, left knee: Secondary | ICD-10-CM

## 2023-04-01 DIAGNOSIS — M170 Bilateral primary osteoarthritis of knee: Secondary | ICD-10-CM | POA: Diagnosis not present

## 2023-04-01 DIAGNOSIS — M1711 Unilateral primary osteoarthritis, right knee: Secondary | ICD-10-CM

## 2023-04-01 DIAGNOSIS — Z6841 Body Mass Index (BMI) 40.0 and over, adult: Secondary | ICD-10-CM

## 2023-04-01 MED ORDER — LIDOCAINE HCL 1 % IJ SOLN
2.0000 mL | INTRAMUSCULAR | Status: AC | PRN
Start: 2023-04-01 — End: 2023-04-01
  Administered 2023-04-01: 2 mL

## 2023-04-01 MED ORDER — BUPIVACAINE HCL 0.5 % IJ SOLN
2.0000 mL | INTRAMUSCULAR | Status: AC | PRN
Start: 2023-04-01 — End: 2023-04-01
  Administered 2023-04-01: 2 mL via INTRA_ARTICULAR

## 2023-04-01 MED ORDER — METHYLPREDNISOLONE ACETATE 40 MG/ML IJ SUSP
40.0000 mg | INTRAMUSCULAR | Status: AC | PRN
  Administered 2023-04-01: 40 mg via INTRA_ARTICULAR

## 2023-04-01 NOTE — Progress Notes (Signed)
Office Visit Note   Patient: April [name]           Date of Birth: 01/13/1971           MRN: [id number] Visit Date: 04/01/2023              Requested by: Hoy Register, MD [address] [place],  [place] [address] PCP: Hoy Register, MD   Assessment & Plan: Visit Diagnoses:  1. Primary osteoarthritis of right knee   2. Primary osteoarthritis of left knee   3. Body mass index 50.0-59.9, adult [facility])     Plan: Patient is a 52 year old female with advanced valgus deformity DJD.  Cortisone injections performed today.  Based on the soft tissue envelope I am concerned that she may have excess skin after she has loss the weight and may need plastic surgery prior to knee replacement surgery.  She has started the process of gastric bypass surgery.  She will follow-up with me as needed.  Follow-Up Instructions: No follow-ups on file.   Orders:  No orders of the defined types were placed in this encounter.  No orders of the defined types were placed in this encounter.     Procedures: Large Joint Inj: bilateral knee on 04/01/2023 4:44 PM Indications: pain Details: 22 G needle  Arthrogram: No  Medications (Right): 2 mL lidocaine 1 %; 2 mL bupivacaine 0.5 %; 40 mg methylPREDNISolone acetate 40 MG/ML Medications (Left): 2 mL lidocaine 1 %; 2 mL bupivacaine 0.5 %; 40 mg methylPREDNISolone acetate 40 MG/ML Outcome: tolerated well, no immediate complications Patient was prepped and draped in the usual sterile fashion.       Clinical Data: No additional findings.   Subjective: Chief Complaint  Patient presents with   Left Knee - Pain   Right Knee - Pain    HPI Done returns today for bilateral knee pain for advanced DJD.  She is requesting cortisone injections today. Review of Systems  Constitutional: Negative.   HENT: Negative.    Eyes: Negative.   Respiratory: Negative.    Cardiovascular: Negative.   Endocrine: Negative.   Musculoskeletal:  Positive for  arthralgias.  Neurological: Negative.   Hematological: Negative.   Psychiatric/Behavioral: Negative.    All other systems reviewed and are negative.    Objective: Vital Signs: There were no vitals taken for this visit.  Physical Exam Vitals and nursing note reviewed.  Constitutional:      Appearance: She is well-developed.  HENT:     Head: Normocephalic and atraumatic.  Pulmonary:     Effort: Pulmonary effort is normal.  Abdominal:     Palpations: Abdomen is soft.  Musculoskeletal:     Cervical back: Neck supple.  Skin:    General: Skin is warm.     Capillary Refill: Capillary refill takes less than 2 seconds.  Neurological:     Mental Status: She is alert and oriented to person, place, and time.  Psychiatric:        Behavior: Behavior normal.        Thought Content: Thought content normal.        Judgment: Judgment normal.     Ortho Exam Exam of bilateral knees is unchanged from prior visit. Specialty Comments:  No specialty comments available.  Imaging: No results found.   PMFS History: Patient Active Problem List   Diagnosis Date Noted   COPD exacerbation (HCC) 12/05/2022   Acute asthma exacerbation 07/19/2022   Primary osteoarthritis of left knee 06/11/2022  Asthma with COPD with exacerbation (HCC) 04/28/2022   Chronic pain syndrome    Hypothyroidism 08/23/2021   Chronic heart failure with preserved ejection fraction (HFpEF) (HCC) 08/23/2021   Primary osteoarthritis of right knee 07/04/2021   Body mass index 50.0-59.9, adult (HCC) 07/04/2021   Painful orthopaedic hardware (HCC) 07/04/2021   Severe persistent asthma with exacerbation 07/04/2020   Hot flashes    Asthma-COPD overlap syndrome (HCC) 10/26/2018   Vitamin D deficiency 05/26/2018   Urinary incontinence 11/17/2017   Physical deconditioning 10/25/2017   Debility 09/22/2017   Acute on chronic respiratory failure with hypoxemia (HCC) 08/05/2017   Chronic atrial fibrillation 07/29/2017   AF  (paroxysmal atrial fibrillation) (HCC)    Dyspnea 02/04/2017   Moderate persistent asthma with exacerbation 10/19/2016   Hypochromic anemia 10/02/2016   Elevated hemoglobin A1c 05/05/2016   GERD (gastroesophageal reflux disease) 08/30/2015   Anxiety and depression 08/30/2015   Generalized anxiety disorder 08/30/2015   Hyperlipidemia 12/08/2013   Seasonal allergic rhinitis 08/29/2013   Tobacco abuse in remission 10/07/2012   Chronic obstructive pulmonary disease with acute exacerbation (HCC) 05/07/2012   Knee pain, bilateral 04/25/2011   Insomnia 03/14/2011   Obstructive sleep apnea 12/19/2010   Hypokalemia 08/13/2010   Cervical back pain with evidence of disc disease 04/08/2008   Essential hypertension 07/31/2006   Morbid obesity due to excess calories (HCC) 06/17/2006   Major depressive disorder, recurrent episode (HCC) 04/10/2006   Past Medical History:  Diagnosis Date   Acanthosis nigricans    Anxiety    Arthritis    "knees" (04/28/2017)   Asthma    Followed by Dr. Maple Hudson (pulmonology); receives every other week omalizumab injections; has frequent exacerbations   Back pain    Chronic diastolic CHF (congestive heart failure) (HCC) 01/17/2017   COPD (chronic obstructive pulmonary disease) (HCC)    PFTs in 2002, FEV1/FVC 65, no post bronchodilater test done   Depression    GERD (gastroesophageal reflux disease)    Headache(784.0)    "q couple days" (04/28/2017)   Helicobacter pylori (H. pylori) infection    Hypertension, essential    Insomnia    Joint pain    Lower extremity edema    Menorrhagia    Morbid obesity (HCC)    OSA on CPAP    Sleep study 2008 - mild OSA, not enough events to titrate CPAP; wears CPAP now/pt on 04/28/2017   Pneumonia X 1   Prediabetes    Rheumatoid arthritis (HCC)    Seasonal allergies    Shortness of breath    Tobacco user    Vitamin D deficiency     Family History  Problem Relation Age of Onset   Hypertension Mother    Asthma Daughter     Cancer Paternal Aunt    Asthma Maternal Grandmother     Past Surgical History:  Procedure Laterality Date   CARDIOVERSION N/A 05/30/2017   Procedure: CARDIOVERSION;  Surgeon: Thurmon Fair, MD;  Location: [facility];  Service: Cardiovascular;  Laterality: N/A;   REDUCTION MAMMAPLASTY Bilateral 09/2011   TUBAL LIGATION  1996   bilateral   Social History   Occupational History   Occupation: stay at home  Tobacco Use   Smoking status: Former    Current packs/day: 0.00    Average packs/day: 0.5 packs/day for 26.0 years (13.0 ttl pk-yrs)    Types: Cigarettes    Start date: 09/11/1988    Quit date: 09/12/2014    Years since quitting: 8.5   Smokeless tobacco: Never  Tobacco comments:    Has vaped before, caught in the hospital  Vaping Use   Vaping status: Never Used  Substance and Sexual Activity   Alcohol use: No   Drug use: No   Sexual activity: Never

## 2023-04-01 NOTE — Telephone Encounter (Signed)
I called [facility] and had to leave a message for the Bariatric Navigator requesting a call back.

## 2023-04-02 ENCOUNTER — Other Ambulatory Visit: Payer: Self-pay

## 2023-04-02 NOTE — Telephone Encounter (Signed)
I spoke to [facility] Bariatric Navigator and she said a PA for surgery is not needed.   She explained that the patient completed their on-line orientation and filled out the required forms.  What is needed now is a Letter of Medical Necessity from  the PCP and she  will send me the information that the PCP will need to complete the documentation.   After she receives the documentation from the PCP, she will be ready to schedule the patient for a consult.

## 2023-04-02 NOTE — Telephone Encounter (Signed)
Message received from [facility] stating April [name]/ [facility]- Bariatric Navigator, returned my call.  I called April [name] back : [phone] and had to leave a message requesting a call back.

## 2023-04-02 NOTE — Progress Notes (Signed)
Specialty Pharmacy Refill Coordination Note  April [name] is a 52 y.o. female contacted today regarding refills of specialty medication(s) [facility]   Patient requested Courier to Provider Office   Delivery date: 04/10/23   Verified address: [employer] [address]   Medication will be filled on 04/09/23.

## 2023-04-03 NOTE — Telephone Encounter (Signed)
Dr Alvis Lemmings- I received a message from the bariatric navigator stating that they already received the Letter of Medical Necessity from you.

## 2023-04-07 NOTE — Telephone Encounter (Signed)
Message received from [facility] stating that they have all of the necessary paperwork for the patient.   Nothing needed from Dr Alvis Lemmings at this time

## 2023-04-08 ENCOUNTER — Telehealth: Payer: Self-pay | Admitting: Pharmacy Technician

## 2023-04-08 ENCOUNTER — Encounter: Payer: Self-pay | Admitting: Internal Medicine

## 2023-04-08 NOTE — Telephone Encounter (Signed)
Insurance change:  Patient will need pharmacy benefit.  NOTE:  Medical benefit  Auth Submission: APPROVED Site of care: Site of care: [facility] Payer: UHC DUAL Medication & CPT/J Code(s) submitted: Tezspire Darral Dash) [phone] Route of submission (phone, fax, portal): PORTAL Phone # Fax # Auth type: Medical benefit Units/visits requested: 210MG  Q4WKS Reference number: [id number] Approval from: 04/08/23 to 04/07/24

## 2023-04-09 ENCOUNTER — Other Ambulatory Visit: Payer: Self-pay

## 2023-04-10 ENCOUNTER — Telehealth: Payer: Self-pay | Admitting: Pharmacy Technician

## 2023-04-10 NOTE — Telephone Encounter (Signed)
Devki,  Patients Berkley Harvey will expire 04/29/23.  I have submitted an auth and it was denied. Can you assist with the appeal. Auth was submitted through Cover My Meds.  (Key: [id number])

## 2023-04-14 ENCOUNTER — Ambulatory Visit: Payer: 59

## 2023-04-14 MED ORDER — TEZEPELUMAB-EKKO 210 MG/1.91ML ~~LOC~~ SOSY
210.0000 mg | PREFILLED_SYRINGE | Freq: Once | SUBCUTANEOUS | Status: DC
Start: 2023-04-14 — End: 2023-04-14
  Filled 2023-04-14: qty 1.91

## 2023-04-14 NOTE — Telephone Encounter (Signed)
Last OV note on 02/04/2023 stated patient's asthm  relatively well controlled Appeal faxed to insurance on CMM  (Key: [id number])   Chesley Mires, PharmD, MPH, BCPS, CPP Clinical Pharmacist (Rheumatology and Pulmonology)

## 2023-04-15 ENCOUNTER — Other Ambulatory Visit (HOSPITAL_COMMUNITY): Payer: Self-pay | Admitting: Surgery

## 2023-04-15 ENCOUNTER — Ambulatory Visit (INDEPENDENT_AMBULATORY_CARE_PROVIDER_SITE_OTHER): Payer: 59

## 2023-04-15 ENCOUNTER — Telehealth: Payer: Self-pay | Admitting: *Deleted

## 2023-04-15 ENCOUNTER — Encounter: Payer: Self-pay | Admitting: Internal Medicine

## 2023-04-15 VITALS — BP 127/82 | HR 82 | Temp 98.1°F | Resp 16 | Ht 66.0 in | Wt 316.2 lb

## 2023-04-15 DIAGNOSIS — J4552 Severe persistent asthma with status asthmaticus: Secondary | ICD-10-CM | POA: Diagnosis not present

## 2023-04-15 DIAGNOSIS — J4489 Other specified chronic obstructive pulmonary disease: Secondary | ICD-10-CM | POA: Diagnosis not present

## 2023-04-15 MED ORDER — TEZEPELUMAB-EKKO 210 MG/1.91ML ~~LOC~~ SOSY
210.0000 mg | PREFILLED_SYRINGE | Freq: Once | SUBCUTANEOUS | Status: AC
  Administered 2023-04-15: 210 mg via SUBCUTANEOUS
  Filled 2023-04-15: qty 1.91

## 2023-04-15 NOTE — Telephone Encounter (Signed)
   Pre-operative Risk Assessment    Patient Name: April [name]  DOB: [birth year] MRN: [id number]  DATE OF LAST VISIT: NONE  DATE OF NEXT VISIT: NONE    Request for Surgical Clearance    Procedure:   LAPAROSCOPIC ROUX-EN-Y GASTRIC BYPASS  Date of Surgery:  Clearance TBD                                 Surgeon:  DR. Twana First Surgeon's Group or Practice Name:  [facility]  Phone number:  [phone] Fax number:  [phone] [place] [facility]; BARIATRIC NAVIGATOR   Type of Clearance Requested:   - Medical  - Pharmacy:  Hold Apixaban (Eliquis)     Type of Anesthesia:  General    Additional requests/questions:    Elpidio Anis   04/15/2023, 12:22 PM

## 2023-04-15 NOTE — Progress Notes (Signed)
Diagnosis: Asthma  Provider:  Chilton Greathouse MD  Procedure: Injection  Tezspire (Tezepelumab), Dose: 210 mg, Site: subcutaneous, Number of injections: 1  Injection Site(s): Right arm  Post Care: Patient declined observation  Discharge: Condition: Good, Destination: Home . AVS Declined  Performed by:  Adriana Mccallum, RN

## 2023-04-16 NOTE — Telephone Encounter (Signed)
Looks like pt needs NEW PT APPT FOR PREOP CLEARANCE. I will ask our scheduling team to please reach out to the pt with a new pt appt.

## 2023-04-16 NOTE — Telephone Encounter (Signed)
Patient returned Pre-op call. 

## 2023-04-17 ENCOUNTER — Telehealth: Payer: Self-pay | Admitting: Internal Medicine

## 2023-04-17 NOTE — Telephone Encounter (Signed)
Fax received from Dr. Phylliss Blakes with [facility] to perform Laparascopic Roux-en-Y Bypass surgery on patient.  Patient needs surgery clearance. Surgery is in the near future- date to be determined. Patient was seen on 02/04/23. Office protocol is a risk assessment can be sent to surgeon if patient has been seen in 60 days or less.   Sending to Dr Maple Hudson for risk assessment or recommendations if patient needs to be seen in office prior to surgical procedure.

## 2023-04-17 NOTE — Telephone Encounter (Signed)
Patient scheduled to see Dr. Wyline Mood on 06/20/23 as a new patient for preop clearance

## 2023-04-18 ENCOUNTER — Other Ambulatory Visit (HOSPITAL_COMMUNITY): Payer: Self-pay

## 2023-04-18 NOTE — Telephone Encounter (Addendum)
Per test claim, refill too soon until 04/30/2023. Will re-attempt test claim after this date  Chesley Mires, PharmD, MPH, BCPS, CPP Clinical Pharmacist (Rheumatology and Pulmonology)

## 2023-04-22 ENCOUNTER — Other Ambulatory Visit: Payer: Self-pay | Admitting: Internal Medicine

## 2023-04-23 NOTE — Telephone Encounter (Signed)
How soon can we get her in for a surgery clearance visit with me (ok held-spot) or an APP?

## 2023-04-25 NOTE — Telephone Encounter (Signed)
Patient with diagnosis of afib on Eliquis for anticoagulation.    Procedure: LAPAROSCOPIC ROUX-EN-Y GASTRIC BYPASS   Date of procedure: TBD  Will provide clearance on Eliquis hold after patient's visit with Dr.Branch

## 2023-04-25 NOTE — Telephone Encounter (Signed)
   Name: April [name]  DOB: [birth year]  MRN: [id number]  Primary Cardiologist: Thurmon Fair, MD  Chart reviewed as part of pre-operative protocol coverage. The patient has an upcoming visit scheduled with Dr. Wyline Mood on 06/20/23 to establish care, at which time clearance can be addressed in case there are any issues that would impact surgical recommendations.  Laparoscopic roux-en-y gastric bypass is not yet scheduled as below. I added preop FYI to appointment note so that provider is aware to address at time of outpatient visit.  Per office protocol the cardiology provider should forward their finalized clearance decision and recommendations regarding antiplatelet therapy to the requesting party below.    Pharmacy team unable to provide recommendations for holding of Eliquis until after appointment with Dr. Wyline Mood for patient to establish care.  Recommendations will be provided at that time per pharmacy team.  I will route this message as FYI to requesting party and remove this message from the preop box as separate preop APP input not needed at this time.   Please call with any questions.  Rip Harbour, NP  04/25/2023, 9:48 AM

## 2023-04-29 ENCOUNTER — Other Ambulatory Visit: Payer: Self-pay | Admitting: Internal Medicine

## 2023-04-29 ENCOUNTER — Other Ambulatory Visit (HOSPITAL_COMMUNITY): Payer: Self-pay | Admitting: Pharmacy Technician

## 2023-04-29 ENCOUNTER — Other Ambulatory Visit (HOSPITAL_COMMUNITY): Payer: Self-pay

## 2023-04-29 DIAGNOSIS — J4541 Moderate persistent asthma with (acute) exacerbation: Secondary | ICD-10-CM

## 2023-04-29 DIAGNOSIS — J4489 Other specified chronic obstructive pulmonary disease: Secondary | ICD-10-CM

## 2023-04-29 NOTE — Progress Notes (Signed)
 Specialty Pharmacy Refill Coordination Note  April [name] is a 52 y.o. female contacted today regarding refills of specialty medication(s) Tezepelumab -ekko (TEZSPIRE )   Patient requested Courier to Provider Office   Delivery date: 05/08/23   Verified address: [employer] [address]   Medication will be filled on 05/07/23.  Refill Request sent to MD; Call if any delays

## 2023-05-01 ENCOUNTER — Encounter: Payer: Self-pay | Admitting: Dietician

## 2023-05-01 ENCOUNTER — Other Ambulatory Visit: Payer: Self-pay | Admitting: Internal Medicine

## 2023-05-01 ENCOUNTER — Other Ambulatory Visit: Payer: Self-pay

## 2023-05-01 ENCOUNTER — Encounter: Payer: 59 | Attending: Surgery | Admitting: Dietician

## 2023-05-01 VITALS — Ht 65.0 in | Wt 323.9 lb

## 2023-05-01 DIAGNOSIS — E669 Obesity, unspecified: Secondary | ICD-10-CM | POA: Insufficient documentation

## 2023-05-01 DIAGNOSIS — J4489 Other specified chronic obstructive pulmonary disease: Secondary | ICD-10-CM

## 2023-05-01 DIAGNOSIS — Z713 Dietary counseling and surveillance: Secondary | ICD-10-CM | POA: Insufficient documentation

## 2023-05-01 DIAGNOSIS — Z6841 Body Mass Index (BMI) 40.0 and over, adult: Secondary | ICD-10-CM | POA: Diagnosis not present

## 2023-05-01 DIAGNOSIS — J4541 Moderate persistent asthma with (acute) exacerbation: Secondary | ICD-10-CM

## 2023-05-01 NOTE — Progress Notes (Signed)
 Nutrition Assessment for Bariatric Surgery: Pre-Surgery Behavioral and Nutrition Intervention Program   Medical Nutrition Therapy  Appt Start Time: 1011    End Time: 1122  Patient was seen on 05/01/2023 for Pre-Operative Nutrition Assessment. Purpose of todays visit  enhance perioperative outcomes along with a healthy weight maintenance   Referral stated Supervised Weight Loss (SWL) visits needed: 6 months  Planned surgery: RYGB Pt expectation of surgery: lose weight to be healthy  NUTRITION ASSESSMENT   Anthropometrics  Start weight at [facility]: 323.9 lbs (date: 05/01/2023)  Height: 65 in BMI: 53.90 kg/m2     Clinical   Pharmacotherapy: History of weight loss medication used: phentermine , Ozempic  (not tolerated)  Medical hx: asthma, COPD, HTN Medications:  omeprazole , gabapentin , albuterol , clonazepam , epinephrine , famotidine , Flonase , Rybelsus , Ambien , vit D, calcium   Labs: A1c 5.5, sodium 132, glucose 109, CO2 18, calcium  8.5, albumin  3.4, hemoglobin 10.8, HCT 35.4 Notable signs/symptoms: walking slow Any previous deficiencies? No  Evaluation of Nutritional Deficiencies: Micronutrient Nutrition Focused Physical Exam: Hair: No issues observed Eyes: No issues observed Mouth: No issues observed Neck: No issues observed Nails: No issues observed Skin: No issues observed  Lifestyle & Dietary Hx  Pt states she has been on steroids lately.  Pt states she was on a pathway before for bariatric surgery with another surgeons office. Pt states she has a treadmill, stating it is not in working condition. Pt states she watches 600 lb Life all the time, to pick up some tips for weight loss. Pt states she is mostly at home all day. Pt states she takes Vit D once a week.  Current Physical Activity Recommendations state 150 minutes per week of moderate to vigorous movement including Cardio and 1-2 days of resistance activities as well as flexibility/balance activities:  Pts current  physical activity: ADLs, with 0% recommendation reached   Sleep Hygiene: duration and quality: good; takes Ambien    Current Patient Perceived Stress Level as stated by pt on a scale of 1-10:  4       Stress Management Techniques: pray  According to the Dietary Guidelines for Americans Recommendation: equivalent 1.5-2 cups fruits per day, equivalent 2-3 cups vegetables per day and at least half all grains whole  Fruit servings per day (on average): 0-1, meeting 0-50% recommendation  Non-starchy vegetable servings per day (on average): 0-1, meeting 0-50% recommendation  Whole Grains per day (on average): 0-1  Number of meals missed/skipped per week out of 21: 7-14  24-Hr Dietary Recall First Meal: skip  Snack: soda Second Meal: skip (if out and about, fast food) Snack: soda Third Meal: chicken or pork chop, greens, rice or mashed potatoes Snack: popcorn Beverages: water , soda (ginger ale, Pepsi)  Alcoholic beverages per week: 0   Estimated Energy Needs Calories: 1500  NUTRITION DIAGNOSIS  Overweight/obesity ([place]-3.3) related to past poor dietary habits and physical inactivity as evidenced by patient w/ planned RYGB surgery following dietary guidelines for continued weight loss.  NUTRITION INTERVENTION  Nutrition counseling (C-1) and education (E-2) to facilitate bariatric surgery goals.  Educated pt on micronutrient deficiencies post-surgery and behavioral/dietary strategies to start in order to mitigate that risk   Behavioral and Dietary Interventions Pre-Op Goals Reviewed with the Patient Nutrition: Healthy Eating Behaviors Switch to non-caloric, non-carbonated and non-caffeinated beverages such as  water , unsweetened tea, Crystal Light and zero calorie beverages (aim for 64 oz. per day) Cut out grazing between meals or at night  Find a protein shake you like Eat every 3-5 hours  Eliminate distractions while eating (TV, computer, reading, driving, texting) Take 79-69  minutes to eat a meal  Decrease high sugar foods/decrease high fat/fried foods Eliminate alcoholic beverages Increase protein intake (eggs, fish, chicken, yogurt) before surgery Eat non starchy vegetables 2 times a day 7 days a week Eat complex carbohydrates such as whole grains and fruits   Behavioral Modification: Physical Activity Increase my usual daily activity (use stairs, park farther, etc.) Engage in ___________gym____________  activity  ___20____ minutes ___1___ times per week  Other:    _________________________________________________________________     Problem Solving I will think about my usual eating patterns and how to tweak them How can my friends and family support me Barriers to starting my changes Learn and understand appetite verses hunger   Healthy Coping Allow for ___________ activities per week to help me manage stress Reframe negative thoughts I will keep a picture of someone or something that is my inspiration & look at it daily   Monitoring  Weigh myself once a week  Measure my progress by monitoring how my clothes fit Keep a food record of what I eat and drink for the next ________ (time period) Take pictures of what I eat and drink for the next ________ (time period) Use an app to count steps/day for the next_______ (time period) Measure my progress such as increased energy and more restful sleep Monitor your acid reflux and bowel habits, are they getting better?   *Goals that are bolded indicate the pt would like to start working towards these  Handouts Provided Include  Bariatric Surgery handouts (Nutrition Visits, Pre Surgery Behavioral Change Goals, Protein Shakes Brands to Choose From, Vitamins & Mineral Supplementation)  Learning Style & Readiness for Change Teaching method utilized: Visual, Auditory, and hands on  Demonstrated degree of understanding via: Teach Back  Readiness Level: preparation Barriers to learning/adherence to lifestyle  change: nothing identified  RD's Notes for Next Visit Patient progress toward chosen goals    MONITORING & EVALUATION Dietary intake, weekly physical activity, body weight, and preoperative behavioral change goals   Next Steps  Patient is to follow up at [facility] for first SWL visit.

## 2023-05-02 ENCOUNTER — Other Ambulatory Visit: Payer: Self-pay

## 2023-05-02 MED ORDER — TEZSPIRE 210 MG/1.91ML ~~LOC~~ SOAJ
210.0000 mg | SUBCUTANEOUS | 2 refills | Status: DC
  Filled 2023-05-02: qty 1.91, 28d supply, fill #0
  Filled 2023-06-03: qty 1.91, 28d supply, fill #1
  Filled 2023-06-26: qty 1.91, 28d supply, fill #2

## 2023-05-02 NOTE — Telephone Encounter (Signed)
 Refill sent for [facility] to [facility]: [phone]   Dose: 210mg  SQ every 4 weeks  Last OV: 10/8/204 Provider: Dr. Maple Hudson  Next OV: 06/09/2023  Chesley Mires, PharmD, MPH, BCPS Clinical Pharmacist (Rheumatology and Pulmonology)

## 2023-05-02 NOTE — Telephone Encounter (Signed)
 Called and spoke with the pt  She is scheduled for 06/09/23 with Dr Maple Hudson  She states that this date works best for her and does not feel it needs to be moved up Holding this encounter until seen and risk assessment is complete

## 2023-05-06 ENCOUNTER — Other Ambulatory Visit (HOSPITAL_COMMUNITY): Payer: Self-pay

## 2023-05-06 NOTE — Telephone Encounter (Signed)
 Test claim for Tezspire processed successfully for $0 copay. Closing encounter

## 2023-05-08 ENCOUNTER — Encounter (HOSPITAL_COMMUNITY): Payer: Self-pay

## 2023-05-08 ENCOUNTER — Inpatient Hospital Stay (HOSPITAL_COMMUNITY): Admission: RE | Admit: 2023-05-08 | Payer: 59 | Source: Ambulatory Visit

## 2023-05-13 ENCOUNTER — Encounter: Payer: Self-pay | Admitting: Internal Medicine

## 2023-05-13 ENCOUNTER — Ambulatory Visit: Payer: 59

## 2023-05-13 VITALS — BP 175/95 | HR 85 | Temp 98.0°F | Resp 20 | Ht 65.0 in | Wt 322.6 lb

## 2023-05-13 DIAGNOSIS — J4552 Severe persistent asthma with status asthmaticus: Secondary | ICD-10-CM

## 2023-05-13 DIAGNOSIS — J4489 Other specified chronic obstructive pulmonary disease: Secondary | ICD-10-CM | POA: Diagnosis not present

## 2023-05-13 MED ORDER — TEZEPELUMAB-EKKO 210 MG/1.91ML ~~LOC~~ SOSY
210.0000 mg | PREFILLED_SYRINGE | Freq: Once | SUBCUTANEOUS | Status: AC
Start: 2023-05-13 — End: 2023-05-13
  Administered 2023-05-13: 210 mg via SUBCUTANEOUS
  Filled 2023-05-13: qty 1.91

## 2023-05-13 NOTE — Progress Notes (Signed)
 Diagnosis: Asthma  Provider:  Mannam, Praveen MD  Procedure: Injection  Tezspire  (Tezepelumab ), Dose: 210 mg, Site: subcutaneous, Number of injections: 1  Injection Site(s): Left arm  Post Care:  N/A  Discharge: Condition: Good, Destination: Home . AVS Declined  Performed by:  Donny Childes, RN

## 2023-05-15 ENCOUNTER — Encounter: Payer: Self-pay | Admitting: Dietician

## 2023-05-15 ENCOUNTER — Encounter: Payer: 59 | Attending: Surgery | Admitting: Dietician

## 2023-05-15 VITALS — Ht 65.0 in | Wt 319.1 lb

## 2023-05-15 DIAGNOSIS — Z713 Dietary counseling and surveillance: Secondary | ICD-10-CM | POA: Diagnosis not present

## 2023-05-15 DIAGNOSIS — Z6841 Body Mass Index (BMI) 40.0 and over, adult: Secondary | ICD-10-CM | POA: Diagnosis not present

## 2023-05-15 DIAGNOSIS — E669 Obesity, unspecified: Secondary | ICD-10-CM | POA: Insufficient documentation

## 2023-05-15 NOTE — Progress Notes (Signed)
Supervised Weight Loss Visit Bariatric Nutrition Education Appt Start Time: 1611    End Time: 1656  Planned surgery: RYGB Pt expectation of surgery: lose weight to be healthy  1 out of 6 SWL Appointments   NUTRITION ASSESSMENT   Anthropometrics  Start weight at [facility]: 323.9 lbs (date: 05/01/2023)  Height: 65 in Weight today: 319.1 lbs BMI: 53.90 kg/m2     Clinical   Pharmacotherapy: History of weight loss medication used: phentermine, Ozempic (not tolerated)  Medical hx: asthma, COPD, HTN Medications:  omeprazole, gabapentin, albuterol, clonazepam, epinephrine, famotidine, Flonase, Rybelsus, Ambien, vit D, calcium  Labs: Chol 214; Triglycerides 171; LDL 127; A1c 5.8, folate 2.4, Vit D 25.5 Notable signs/symptoms: walking slow Any previous deficiencies? No  Lifestyle & Dietary Hx  Pt states she has not been avoiding the sugary, stating she is almost out of her ginger ale. Pt states she is going to try harder. Pt states she is not hungry during the day. Pt is agreeable to try to eat a small meal or snack even when not hungry.  Estimated daily fluid intake: not tracking Supplements: vit D, calcium Current average weekly physical activity: ADLs  24-Hr Dietary Recall First Meal: skip  Snack: soda Second Meal: skip (if out and about, fast food) Snack: soda Third Meal: chicken or pork chop, greens, rice or mashed potatoes Snack: popcorn Beverages: water, soda (ginger ale, Pepsi)  Estimated Energy Needs Calories: 1500  NUTRITION DIAGNOSIS  Overweight/obesity ([place]-3.3) related to past poor dietary habits and physical inactivity as evidenced by patient w/ planned RYGB surgery following dietary guidelines for continued weight loss.   NUTRITION INTERVENTION  Nutrition counseling (C-1) and education (E-2) to facilitate bariatric surgery goals.  Encouraged pt to continue to eat balanced meals inclusive of non starchy vegetables 2 times a day 7 days a week Encouraged pt to  choose lean protein sources: limiting beef, pork, sausage, hotdogs, and lunch meat Encourage pt to choose healthy fats such as plant based limiting animal fats Encouraged pt to continue to drink a minium 64 fluid ounces with half being plain water to satisfy proper hydration    Pre-Op Goals Progress & New Goals New: Start taking folate per PCP Continue: Switch to non-caloric, non-carbonated and non-caffeinated beverages such as  water, unsweetened tea, Crystal Light and zero calorie beverages (aim for 64 oz. per day) Continue: increase physical activity. Engage in gym activity  20 minutes 1 time per week New:  Eat every 3-5 hours during the day; have a protein with every meal and snack   Handouts Provided Include  Meal Ideas Handout  Learning Style & Readiness for Change Teaching method utilized: Visual & Auditory  Demonstrated degree of understanding via: Teach Back  Readiness Level: preparation Barriers to learning/adherence to lifestyle change: mobility  RD's Notes for next Visit  Patient progress toward chosen goals  MONITORING & EVALUATION Dietary intake, weekly physical activity, body weight, and pre-op goals in 1 month.   Next Steps  Patient is to return to [facility] in 1 month for next SWL visit.

## 2023-05-20 ENCOUNTER — Ambulatory Visit: Payer: 59 | Attending: Family Medicine | Admitting: Family Medicine

## 2023-05-20 ENCOUNTER — Encounter: Payer: Self-pay | Admitting: Family Medicine

## 2023-05-20 ENCOUNTER — Other Ambulatory Visit: Payer: Self-pay | Admitting: Family Medicine

## 2023-05-20 ENCOUNTER — Telehealth: Payer: Self-pay

## 2023-05-20 DIAGNOSIS — F419 Anxiety disorder, unspecified: Secondary | ICD-10-CM

## 2023-05-20 DIAGNOSIS — E038 Other specified hypothyroidism: Secondary | ICD-10-CM

## 2023-05-20 DIAGNOSIS — Z79899 Other long term (current) drug therapy: Secondary | ICD-10-CM

## 2023-05-20 DIAGNOSIS — I10 Essential (primary) hypertension: Secondary | ICD-10-CM

## 2023-05-20 DIAGNOSIS — Z6841 Body Mass Index (BMI) 40.0 and over, adult: Secondary | ICD-10-CM

## 2023-05-20 DIAGNOSIS — J4541 Moderate persistent asthma with (acute) exacerbation: Secondary | ICD-10-CM

## 2023-05-20 DIAGNOSIS — F331 Major depressive disorder, recurrent, moderate: Secondary | ICD-10-CM | POA: Insufficient documentation

## 2023-05-20 DIAGNOSIS — E1159 Type 2 diabetes mellitus with other circulatory complications: Secondary | ICD-10-CM | POA: Diagnosis not present

## 2023-05-20 DIAGNOSIS — I5033 Acute on chronic diastolic (congestive) heart failure: Secondary | ICD-10-CM | POA: Insufficient documentation

## 2023-05-20 DIAGNOSIS — I480 Paroxysmal atrial fibrillation: Secondary | ICD-10-CM

## 2023-05-20 DIAGNOSIS — Z7984 Long term (current) use of oral hypoglycemic drugs: Secondary | ICD-10-CM

## 2023-05-20 DIAGNOSIS — E66813 Obesity, class 3: Secondary | ICD-10-CM

## 2023-05-20 DIAGNOSIS — E1169 Type 2 diabetes mellitus with other specified complication: Secondary | ICD-10-CM | POA: Diagnosis not present

## 2023-05-20 DIAGNOSIS — E876 Hypokalemia: Secondary | ICD-10-CM

## 2023-05-20 DIAGNOSIS — J4551 Severe persistent asthma with (acute) exacerbation: Secondary | ICD-10-CM

## 2023-05-20 NOTE — Progress Notes (Signed)
Subjective:  Patient ID: April [name], female    DOB: 09/17/1970  Age: 53 y.o. MRN: [id number]  CC: Medical Management of Chronic Issues (Discuss upcoming surgery)   HPI April [name] is a 53 y.o. year old female with a history of morbid obesity, major depressive disorder, hypertension, COPD/ Asthma, paroxysmal atrial fibrillation obstructive sleep apnea (not compliant with CPAP) with multiple hospitalizations for asthma exacerbation/COPD , steroid-induced diabetes   Interval History: Discussed the use of AI scribe software for clinical note transcription with the patient, who gave verbal consent to proceed.   She is preparing for bariatric surgery. She has been attending nutritionist appointments, exercising, and increasing protein intake as part of the preoperative regimen. She expresses confusion about the frequency of appointments leading up to the surgery, which she anticipates will occur in June or July.  I informed her that I have submitted all necessary paperwork required from PCP to the bariatric surgeon.  The patient has been managing her sleep apnea and diabetes well, with an A1c of 5.5. She reports that recent blood work indicated low B9 and potassium levels, but she has since been taking potassium supplements. She has been taking Rybelsus 3mg  for diabetes management and Eliquis for anticoagulation for A-fib. She also takes levothyroxine for hypothyroidism, but reports misplacing her levothyroxine medication and has not been taking it recently.  The patient also reports taking Klonopin for anxiety, which has been particularly helpful in managing her anxiety about the passing of her child. She expresses a need for this medication and requests a prescription.  She has been on Klonopin chronically prescribed by her pulmonologist Dr. Maple Hudson and previously by a psychiatrist in [facility].  She informs me the psychiatrist will no longer see her because she had marijuana in her urine which she  stated came from a guy she was dating as she did not use marijuana herself. She is also on opioid analgesics for management of chronic pain which is prescribed by the pain clinic.      Past Medical History:  Diagnosis Date   Acanthosis nigricans    Anxiety    Arthritis    "knees" (04/28/2017)   Asthma    Followed by Dr. Maple Hudson (pulmonology); receives every other week omalizumab injections; has frequent exacerbations   Back pain    Chronic diastolic CHF (congestive heart failure) (HCC) 01/17/2017   COPD (chronic obstructive pulmonary disease) (HCC)    PFTs in 2002, FEV1/FVC 65, no post bronchodilater test done   Depression    GERD (gastroesophageal reflux disease)    Headache(784.0)    "q couple days" (04/28/2017)   Helicobacter pylori (H. pylori) infection    Hypertension, essential    Insomnia    Joint pain    Lower extremity edema    Menorrhagia    Morbid obesity (HCC)    OSA on CPAP    Sleep study 2008 - mild OSA, not enough events to titrate CPAP; wears CPAP now/pt on 04/28/2017   Pneumonia X 1   Prediabetes    Rheumatoid arthritis (HCC)    Seasonal allergies    Shortness of breath    Tobacco user    Vitamin D deficiency     Past Surgical History:  Procedure Laterality Date   CARDIOVERSION N/A 05/30/2017   Procedure: CARDIOVERSION;  Surgeon: Thurmon Fair, MD;  Location: [facility];  Service: Cardiovascular;  Laterality: N/A;   REDUCTION MAMMAPLASTY Bilateral 09/2011   TUBAL LIGATION  1996   bilateral  Family History  Problem Relation Age of Onset   Hypertension Mother    Asthma Daughter    Cancer Paternal Aunt    Asthma Maternal Grandmother     Social History   Socioeconomic History   Marital status: Single    Spouse name: Not on file   Number of children: 1   Years of education: Not on file   Highest education level: Not on file  Occupational History   Occupation: stay at home  Tobacco Use   Smoking status: Former    Current packs/day: 0.00     Average packs/day: 0.5 packs/day for 26.0 years (13.0 ttl pk-yrs)    Types: Cigarettes    Start date: 09/11/1988    Quit date: 09/12/2014    Years since quitting: 8.6   Smokeless tobacco: Never   Tobacco comments:    Has vaped before, caught in the hospital  Vaping Use   Vaping status: Never Used  Substance and Sexual Activity   Alcohol use: No   Drug use: No   Sexual activity: Never  Other Topics Concern   Not on file  Social History Narrative   Daughters are grown, not married, works as a Lawyer.   Social Drivers of Health   Financial Resource Strain: Medium Risk (11/20/2022)   Overall Financial Resource Strain (CARDIA)    Difficulty of Paying Living Expenses: Somewhat hard  Food Insecurity: No Food Insecurity (01/29/2023)   Hunger Vital Sign    Worried About Running Out of Food in the Last Year: Never true    Ran Out of Food in the Last Year: Never true  Transportation Needs: No Transportation Needs (01/29/2023)   PRAPARE - Administrator, Civil Service (Medical): No    Lack of Transportation (Non-Medical): No  Physical Activity: Insufficiently Active (11/20/2022)   Exercise Vital Sign    Days of Exercise per Week: 3 days    Minutes of Exercise per Session: 30 min  Stress: Stress Concern Present (11/20/2022)   [employer] of Occupational Health - Occupational Stress Questionnaire    Feeling of Stress : To some extent  Social Connections: Socially Isolated (11/20/2022)   Social Connection and Isolation Panel [NHANES]    Frequency of Communication with Friends and Family: More than three times a week    Frequency of Social Gatherings with Friends and Family: Three times a week    Attends Religious Services: Never    Active Member of Clubs or Organizations: No    Attends Banker Meetings: Never    Marital Status: Never married    Allergies  Allergen Reactions   Other Shortness Of Breath and Other (See Comments)    Seasonal allergies- Wheezing    Contrast Media [Iodinated Contrast Media] Itching and Other (See Comments)    CT contrast    Outpatient Medications Prior to Visit  Medication Sig Dispense Refill   albuterol (VENTOLIN HFA) 108 (90 Base) MCG/ACT inhaler INHALE 2 PUFFS INTO THE LUNGS EVERY 6 HOURS AS NEEDED FOR WHEEZING OR SHORTNESS OF BREATH 8.5 g 3   amLODipine (NORVASC) 10 MG tablet Take 1 tablet (10 mg total) by mouth daily. 90 tablet 1   apixaban (ELIQUIS) 5 MG TABS tablet Take 1 tablet (5 mg total) by mouth 2 (two) times daily. 180 tablet 1   atorvastatin (LIPITOR) 20 MG tablet Take 1 tablet (20 mg total) by mouth daily. 90 tablet 1   carvedilol (COREG) 6.25 MG tablet Take 1 tablet (6.25 mg total)  by mouth 2 (two) times daily with a meal. 180 tablet 1   clonazePAM (KLONOPIN) 0.5 MG tablet Take 0.5 mg by mouth 2 (two) times daily as needed for anxiety.     EPINEPHrine 0.3 mg/0.3 mL IJ SOAJ injection Inject 0.3 mg into the muscle as needed for anaphylaxis. 1 each 5   famotidine (PEPCID) 40 MG tablet Take 40 mg by mouth at bedtime.     Fezolinetant (VEOZAH) 45 MG TABS Take 1 tablet (45 mg total) by mouth daily. 90 tablet 1   fluticasone (FLONASE) 50 MCG/ACT nasal spray SHAKE LIQUID AND USE 1 SPRAY IN EACH NOSTRIL DAILY AS NEEDED FOR ALLERGIES OR RHINITIS (Patient taking differently: Place 1 spray into both nostrils daily.) 16 g 0   Fluticasone-Umeclidin-Vilant (TRELEGY ELLIPTA) 200-62.5-25 MCG/ACT AEPB Inhale 1 puff into the lungs daily. 60 each 5   furosemide (LASIX) 20 MG tablet TAKE 1 TABLET(20 MG) BY MOUTH DAILY 90 tablet 0   gabapentin (NEURONTIN) 300 MG capsule Take 300 mg by mouth 3 (three) times daily.     ipratropium-albuterol (DUONEB) 0.5-2.5 (3) MG/3ML SOLN Take 3 mLs by nebulization every 4 (four) hours as needed (shortness of breath or wheezing). 360 mL 0   levothyroxine (SYNTHROID) 50 MCG tablet Take 1 tablet (50 mcg total) by mouth daily. (Patient taking differently: Take 50 mcg by mouth daily before  breakfast.) 90 tablet 1   montelukast (SINGULAIR) 10 MG tablet Take 1 tablet (10 mg total) by mouth at bedtime. 90 tablet 1   naloxone (NARCAN) nasal spray 4 mg/0.1 mL Place 1 spray into the nose once as needed (opioid overdose).     omeprazole (PRILOSEC) 40 MG capsule Take 40 mg by mouth at bedtime.     Potassium Chloride ER 20 MEQ TBCR Take 1 tablet by mouth daily.     pregabalin (LYRICA) 25 MG capsule Take 25 mg by mouth 2 (two) times daily.     Semaglutide (RYBELSUS) 3 MG TABS Take 1 tablet (3 mg total) by mouth daily. 30 tablet 3   Tezepelumab-ekko (TEZSPIRE) 210 MG/1. SOAJ Inject 210 mg into the skin every 28 (twenty-eight) days. 1.91 mL 2   topiramate (TOPAMAX) 200 MG tablet Take 200 mg by mouth at bedtime.     venlafaxine XR (EFFEXOR-XR) 75 MG 24 hr capsule TAKE 1 CAPSULE(75 MG) BY MOUTH DAILY WITH BREAKFAST FOR HOT FLASHES 30 capsule 0   Vitamin D, Ergocalciferol, (DRISDOL) 1.25 MG (50000 UNIT) CAPS capsule Take 50,000 Units by mouth every 7 (seven) days.     zolpidem (AMBIEN) 10 MG tablet TAKE 1 TABLET BY MOUTH AT BEDTIME AS NEEDED FOR SLEEP 30 tablet 5   HYDROcodone bit-homatropine (HYCODAN) 5-1.5 MG/5ML syrup Take 5 mLs by mouth every 6 (six) hours as needed for cough. (Patient not taking: Reported on 05/20/2023) 120 mL 0   oxyCODONE-acetaminophen (PERCOCET) 10-325 MG tablet Take 1 tablet by mouth 5 (five) times daily as needed for pain. (Patient not taking: Reported on 05/20/2023) 150 tablet 0   predniSONE (DELTASONE) 10 MG tablet Take 2 tablets (20 mg total) by mouth daily with breakfast. (Patient not taking: Reported on 05/20/2023) 60 tablet 2   solifenacin (VESICARE) 5 MG tablet TAKE 1 TABLET(5 MG) BY MOUTH DAILY (Patient not taking: Reported on 05/20/2023) 90 tablet 1   Facility-Administered Medications Prior to Visit  Medication Dose Route Frequency Provider Last Rate Last Admin   tezepelumab-ekko (TEZSPIRE) 210 MG/1. syringe 210 mg  210 mg Subcutaneous Once [name],  MD  tezepelumab-ekko (TEZSPIRE) 210 MG/1. syringe 210 mg  210 mg Subcutaneous Once Jetty Duhamel D, MD         ROS Review of Systems  Constitutional:  Negative for activity change and appetite change.  HENT:  Negative for sinus pressure and sore throat.   Respiratory:  Negative for chest tightness, shortness of breath and wheezing.   Cardiovascular:  Negative for chest pain and palpitations.  Gastrointestinal:  Negative for abdominal distention, abdominal pain and constipation.  Genitourinary: Negative.   Musculoskeletal: Negative.   Psychiatric/Behavioral:  Negative for behavioral problems and dysphoric mood.     Objective:  BP 107/74   Ht 5\' 5"  (1.651 m)   Wt (!) 319 lb (144.7 kg)   BMI 53.08 kg/m      05/20/2023    4:03 PM 05/15/2023    4:11 PM 05/13/2023    4:04 PM  BP/Weight  Systolic BP 107  161  Diastolic BP 74  95  Wt. (Lbs) 319 319.1 322.6  BMI 53.08 kg/m2 53.1 kg/m2 53.68 kg/m2      Physical Exam Constitutional:      Appearance: She is well-developed.  Cardiovascular:     Rate and Rhythm: Normal rate.     Heart sounds: Normal heart sounds. No murmur heard. Pulmonary:     Effort: Pulmonary effort is normal.     Breath sounds: Wheezing present. No rales.  Chest:     Chest wall: No tenderness.  Abdominal:     General: Bowel sounds are normal. There is no distension.     Palpations: Abdomen is soft. There is no mass.     Tenderness: There is no abdominal tenderness.  Musculoskeletal:     Right lower leg: No edema.     Left lower leg: No edema.     Comments: Chronic knee pain  Neurological:     Mental Status: She is alert and oriented to person, place, and time.  Psychiatric:        Mood and Affect: Mood normal.        Latest Ref Rng & Units 01/26/2023    5:29 AM 01/24/2023    4:38 AM 01/23/2023    5:07 PM  CMP  Glucose 70 - 99 mg/dL 096  045  88   BUN 6 - 20 mg/dL 27  11  9    Creatinine 0.44 - 1.00 mg/dL 4.09  8.11  9.14   Sodium 135  - 145 mmol/L 136  132  137   Potassium 3.5 - 5.1 mmol/L 3.3  4.7  3.5   Chloride 98 - 111 mmol/L 106  107  107   CO2 22 - 32 mmol/L 22  18  23    Calcium 8.9 - 10.3 mg/dL 8.2  8.5  8.5   Total Protein 6.5 - 8.1 g/dL 6.7  7.7    Total Bilirubin 0.3 - 1.2 mg/dL 0.5  0.9    Alkaline Phos 38 - 126 U/L 52  67    AST 15 - 41 U/L 10  24    ALT 0 - 44 U/L 10  15      Lipid Panel     Component Value Date/Time   CHOL 192 10/16/2021 0506   CHOL 238 (H) 03/30/2020 1128   TRIG 51 10/16/2021 0506   HDL 99 10/16/2021 0506   HDL 45 03/30/2020 1128   CHOLHDL 1.9 10/16/2021 0506   VLDL 10 10/16/2021 0506   LDLCALC 83 10/16/2021 0506   LDLCALC 167 (H) 03/30/2020  1128    CBC    Component Value Date/Time   WBC 15.4 (H) 01/26/2023 0529   RBC 4.05 01/26/2023 0529   HGB 10.8 (L) 01/26/2023 0529   HGB 10.4 (L) 01/22/2018 1403   HCT 35.4 (L) 01/26/2023 0529   HCT 33.3 (L) 01/22/2018 1403   PLT 385 01/26/2023 0529   PLT 616 (H) 01/22/2018 1403   MCV 87.4 01/26/2023 0529   MCV 82 01/22/2018 1403   MCH 26.7 01/26/2023 0529   MCHC 30.5 01/26/2023 0529   RDW 18.8 (H) 01/26/2023 0529   RDW 17.1 (H) 01/22/2018 1403   LYMPHSABS 5.3 (H) 01/26/2023 0529   LYMPHSABS 3.3 (H) 01/22/2018 1403   MONOABS 1.5 (H) 01/26/2023 0529   EOSABS 0.0 01/26/2023 0529   EOSABS 0.3 01/22/2018 1403   BASOSABS 0.1 01/26/2023 0529   BASOSABS 0.1 01/22/2018 1403    Lab Results  Component Value Date   HGBA1C 5.5 01/24/2023    Lab Results  Component Value Date   TSH 2.870 08/21/2022    Assessment & Plan:      Morbid obesity Patient is preparing for bariatric surgery with [facility]. She is seeing a nutritionist and exercising. She is also taking protein as advised. She is concerned about the frequency of appointments prior to the surgery date. -Continue current preparation plan for bariatric surgery.  Hypothyroidism Patient has misplaced her levothyroxine medication and has not been taking  it. -Order thyroid function tests. -Depending on the results, send a new prescription for levothyroxine to the pharmacy. -I will not adjust her levothyroxine dose if thyroid labs are abnormal given she has been without her levothyroxine  Anxiety History of chronic benzodiazepine use Of note she is also on Effexor  Patient reports benefit from Klonopin for anxiety, initially prescribed by her pulmonologist and later by his psychiatrist. Patient has been self-managing her anxiety. -Advised that she is on multiple high risk medications including opioid analgesics, Ambien -Refer patient to psychiatry for evaluation and management of anxiety.  Type II diabetes Patient's A1c is well-controlled at 5.5. -Currently on Rybelsus -Continue current diabetes management plan.  Atrial fibrillation Patient is on Eliquis for A-fib. She may need to stop this medication temporarily for her upcoming surgery. -Continue Eliquis. Provide instructions for perioperative management closer to the surgery date.   Hypertension Controlled -Continue antihypertensive  Severe asthma With recurrent hospitalizations She has a chronic wheeze Continue current asthma plan per pulmonary  Sleep Apnea Patient has sleep apnea, which needs to be well-managed prior to surgery. -Ensure follow-up with Dr. Maple Hudson for sleep apnea management.  Polypharmacy Advised that she is on multiple high risk medications including opioid analgesics, Ambien that put her at high risk of respiratory depression   General Health Maintenance Patient is on a weight loss journey in preparation for bariatric surgery. She is seeing a nutritionist, exercising, and taking protein. She is also trying to cut out soft drinks from her diet. -Continue current health maintenance plan.          No orders of the defined types were placed in this encounter.   Follow-up: Return in about 6 months (around 11/17/2023) for Chronic medical conditions.        Hoy Register, MD, FAAFP. [facility] [place], [place] [phone]   05/20/2023, 4:52 PM

## 2023-05-20 NOTE — Telephone Encounter (Signed)
Patient in the office today for regular OV. Patient requested assistance with transportation to an appointment on Feb. 4rd in [place], [place]. advised that she can call her insurance they will provide insurance for her. Patient provided the contact number.

## 2023-05-20 NOTE — Telephone Encounter (Signed)
Unable to refill per protocol, Rx expired. Discontinued 08/21/22. Requested Prescriptions  Pending Prescriptions Disp Refills   cloNIDine (CATAPRES) 0.2 MG tablet [Pharmacy Med Name: CLONIDINE 0.2MG  TABLETS] 180 tablet 1    Sig: TAKE 1 TABLET(0.2 MG) BY MOUTH TWICE DAILY     Cardiovascular:  Alpha-2 Agonists Failed - 05/20/2023  2:22 PM      Failed - Last BP in normal range    BP Readings from Last 1 Encounters:  05/13/23 (!) 175/95         Passed - Last Heart Rate in normal range    Pulse Readings from Last 1 Encounters:  05/13/23 85         Passed - Valid encounter within last 6 months    Recent Outpatient Visits           3 months ago Tremor   [facility] Of [place]. [facility] Hoy Register, MD   5 months ago Type 2 diabetes mellitus with other specified complication, without long-term current use of insulin (HCC)   [facility] Of [place]. [facility] Hoy Register, MD   5 months ago Encounter for medication review and counseling   [facility] Dept Of [place]. [facility] Lois Huxley, [place] L, RPH-CPP   6 months ago Type 2 diabetes mellitus with other specified complication, without long-term current use of insulin (HCC)   [facility] Of [place]. [facility] Hoy Register, MD   9 months ago Other specified hypothyroidism   [facility] - A Dept Of [place]. [facility] Hoy Register, MD       Future Appointments             Today Hoy Register, MD [facility] - A Dept Of [place]igha Brideg[place]. [facility]   In 1 month Branch, Alben Spittle, MD [facility]

## 2023-05-20 NOTE — Patient Instructions (Signed)
Bariatric Surgery Information Bariatric surgery, also called weight-loss surgery, is a procedure that helps you lose weight. You may have bariatric surgery if: You have been unable to lose weight through diet and exercise. You have health problems caused by obesity, such as: Type 2 diabetes. Heart disease. Lung disease. How does bariatric surgery help me lose weight? Bariatric surgery helps you lose weight by: Decreasing how much food your body absorbs. This is done by closing off part of your stomach to make it smaller. This limits the amount of food your stomach can hold. Changing your body's regular digestive process so that food bypasses the parts of your body that absorb calories and nutrients. If you decide to have bariatric surgery, it is important to continue to eat a healthy diet and to exercise regularly after the surgery. What are the risks of bariatric surgery? As with any surgical procedure, each type of bariatric surgery has its own risks. These risks also depend on your age, your overall health, and any other medical conditions you may have. Risks of bariatric surgery can be divided into two groups. There are short-term risks and long-term risks. Short-term risks include: Infection. Bleeding. Allergic reactions to medicines or dyes. A blood clot that forms in the leg and travels to the heart or lungs. Leaking of digestive juices into the abdomen. Long-term risks and complications include: Not getting enough nutrients for your body (malnutrition). Narrowing of the digestive tract (stricture or stenosis). Diarrhea, nausea, or vomiting after eating (dumping syndrome). Failure of the device or procedure. This may require another surgery to correct the problem. When deciding on bariatric surgery, it is very important that you: Talk to your health care provider and choose the surgery that is best for you. Ask your health care provider about specific risks for the surgery you  choose. What are the different kinds of bariatric surgery? There are two kinds of bariatric surgeries: Restrictive surgery. This procedure makes your stomach smaller. It does not change your digestive process. The smaller the size of your new stomach, the less food you can eat. There are different types of restrictive surgeries. Malabsorptive surgery. This procedure makes your stomach smaller and alters your digestive process so that your body processes fewer calories and nutrients. These are the most common kind of bariatric surgery. There are different types of malabsorptive surgeries. What are the different types of restrictive surgery? Adjustable gastric banding In this procedure, an inflatable band is placed around your stomach near the upper end. This makes the passageway for food into the rest of your stomach much smaller. The band can be adjusted, making it tighter or looser, by filling it with salt solution. Your surgeon can adjust the band based on how you are feeling and how much weight you are losing. The band can be removed in the future. This requires another surgery. Sleeve gastrectomy In this procedure, your stomach is made smaller. This is done by surgically removing a large part of your stomach. When your stomach is smaller, you feel full more quickly and reduce how much you eat. What are the different types of malabsorptive surgery? Roux-en-Y gastric bypass (RGB) This is the most common weight-loss surgery. In this procedure, a small stomach pouch (gastric pouch) is created in the upper part of your stomach. Next, this gastric pouch is attached directly to the middle part of your small intestine. The farther down your small intestine the new connection is made, the fewer calories and nutrients you will absorb. This surgery  has the highest rate of complications. Biliopancreatic diversion with duodenal switch (BPD/DS) This is a multi-step procedure. First, a large part of your stomach  is removed, making your stomach smaller. Next, this smaller stomach is attached to the lower part of your small intestine. Like the RGB surgery, you absorb fewer calories and nutrients if your stomach is attached farther down the small intestine. Where to find more information American Society for Metabolic and Bariatric Surgery: www.asmbs.[employer] and [employer]estive [employer] Kidney [employer]: [employer] Summary Bariatric surgery, also called weight-loss surgery, is a procedure that helps you lose weight. This surgery may be recommended if you have been unable to lose weight through diet and exercise, or you have health problems caused by obesity, such as type 2 diabetes, heart disease, or lung disease. Generally, risks of bariatric surgery include infection, bleeding, and failure of the surgery or device. Failure of the surgery or device may require another surgery to correct the problem. This information is not intended to replace advice given to you by your health care provider. Make sure you discuss any questions you have with your health care provider. Document Revised: 07/12/2020 Document Reviewed: 07/12/2020 Elsevier Patient Education  2024 [employer].

## 2023-05-21 ENCOUNTER — Other Ambulatory Visit: Payer: Self-pay | Admitting: Family Medicine

## 2023-05-21 DIAGNOSIS — E038 Other specified hypothyroidism: Secondary | ICD-10-CM

## 2023-05-21 LAB — POTASSIUM: Potassium: 4.8 mmol/L (ref 3.5–5.2)

## 2023-05-21 LAB — TSH: TSH: 4.02 u[IU]/mL (ref 0.450–4.500)

## 2023-05-21 LAB — T4, FREE: Free T4: 0.94 ng/dL (ref 0.82–1.77)

## 2023-05-21 LAB — T3: T3, Total: 157 ng/dL (ref 71–180)

## 2023-05-21 MED ORDER — LEVOTHYROXINE SODIUM 50 MCG PO TABS: 50.0000 ug | ORAL_TABLET | Freq: Every day | ORAL | 1 refills | Status: DC

## 2023-05-27 ENCOUNTER — Ambulatory Visit: Payer: Self-pay | Admitting: Family Medicine

## 2023-05-27 NOTE — Telephone Encounter (Signed)
Copied from CRM [id number]. Topic: Clinical - Red Word Triage >> May 27, 2023  4:25 PM Phill Myron wrote: Red Word that prompted transfer to Nurse Triage: Larey Seat sunday, injured both less ( pain 10) have pain meds not working. Patient also stated she is on blood thinners.   Chief Complaint: Fall Symptoms: Pain to legs and tailbone, weakness  Frequency: Single fall Disposition: [x]ED /[]Urgent Care (no appt availability in office) / []Appointment(In office/virtual)/ [] [facility]/ []Home Care/ []Refused Recommended Disposition /[][facility]/ [] Follow-up with PCP Additional Notes: Patient reports she fell 2 days ago in her kitchen. She states that she landed on her tailbone and her legs went in opposite directions. She states that she has been experiencing 10/10 pain since that time. She states she is also experiencing new weakness and states she has a hard time getting up and walking now. Patient advised to go to the ED via ambulance for evaluation and treatment of her fall. Patient verbalized understanding and agreement of this plan.     Reason for Disposition  [1] SEVERE weakness (i.e., unable to walk or barely able to walk, requires support) AND [2] new-onset or worsening  Answer Assessment - Initial Assessment Questions 1. MECHANISM: "How did the fall happen?"     Walking into kitchen and slipped on the floor 2. DOMESTIC VIOLENCE AND ELDER ABUSE SCREENING: "Did you fall because someone pushed you or tried to hurt you?" If Yes, ask: "Are you safe now?"     No 3. ONSET: "When did the fall happen?" (e.g., minutes, hours, or days ago)     2 days ago 4. LOCATION: "What part of the body hit the ground?" (e.g., back, buttocks, head, hips, knees, hands, head, stomach)     Tailbone/buttocks  5. INJURY: "Did you hurt (injure) yourself when you fell?" If Yes, ask: "What did you injure? Tell me more about this?" (e.g., body area; type of injury; pain severity)"      Yes 6. PAIN: "Is there any pain?" If Yes, ask: "How bad is the pain?" (e.g., Scale 1-10; or mild,  moderate, severe)   - NONE (0): No pain   - MILD (1-3): Doesn\'t interfere with normal activities    - MODERATE (4-7): Interferes with normal activities or awakens from sleep    - SEVERE (8-10): Excruciating pain, unable to do any normal activities      10 /10 7. SIZE: For cuts, bruises, or swelling, ask: "How large is it?" (e.g., inches or centimeters)      Has not been able to see 8. PREGNANCY: "Is there any chance you are pregnant?" "When was your last menstrual period?"     No 9. OTHER SYMPTOMS: "Do you have any other symptoms?" (e.g., dizziness, fever, weakness; new onset or worsening).      Weakness 10. CAUSE: "What do you think caused the fall (or falling)?" (e.g., tripped, dizzy spell)       Slipped on kitchen floor  Protocols used: Falls and [facility]

## 2023-05-28 ENCOUNTER — Other Ambulatory Visit: Payer: Self-pay

## 2023-05-28 NOTE — Telephone Encounter (Signed)
FYI

## 2023-05-29 ENCOUNTER — Emergency Department (HOSPITAL_COMMUNITY): Payer: 59

## 2023-05-29 ENCOUNTER — Observation Stay (HOSPITAL_COMMUNITY)
Admission: EM | Admit: 2023-05-29 | Discharge: 2023-05-29 | Disposition: A | Discharge status: Home / Self Care | Payer: 59 | Attending: Internal Medicine | Admitting: Internal Medicine

## 2023-05-29 ENCOUNTER — Other Ambulatory Visit: Payer: Self-pay

## 2023-05-29 ENCOUNTER — Encounter (HOSPITAL_COMMUNITY): Payer: Self-pay | Admitting: Emergency Medicine

## 2023-05-29 DIAGNOSIS — E039 Hypothyroidism, unspecified: Secondary | ICD-10-CM | POA: Diagnosis not present

## 2023-05-29 DIAGNOSIS — Z79899 Other long term (current) drug therapy: Secondary | ICD-10-CM | POA: Diagnosis not present

## 2023-05-29 DIAGNOSIS — I5032 Chronic diastolic (congestive) heart failure: Secondary | ICD-10-CM | POA: Insufficient documentation

## 2023-05-29 DIAGNOSIS — F32A Depression, unspecified: Secondary | ICD-10-CM | POA: Insufficient documentation

## 2023-05-29 DIAGNOSIS — I4891 Unspecified atrial fibrillation: Secondary | ICD-10-CM | POA: Insufficient documentation

## 2023-05-29 DIAGNOSIS — J45901 Unspecified asthma with (acute) exacerbation: Secondary | ICD-10-CM | POA: Diagnosis present

## 2023-05-29 DIAGNOSIS — G8929 Other chronic pain: Secondary | ICD-10-CM | POA: Diagnosis not present

## 2023-05-29 DIAGNOSIS — Z1152 Encounter for screening for COVID-19: Secondary | ICD-10-CM | POA: Insufficient documentation

## 2023-05-29 DIAGNOSIS — Z7901 Long term (current) use of anticoagulants: Secondary | ICD-10-CM | POA: Insufficient documentation

## 2023-05-29 DIAGNOSIS — J4551 Severe persistent asthma with (acute) exacerbation: Secondary | ICD-10-CM

## 2023-05-29 DIAGNOSIS — F419 Anxiety disorder, unspecified: Secondary | ICD-10-CM | POA: Diagnosis not present

## 2023-05-29 DIAGNOSIS — Z87891 Personal history of nicotine dependence: Secondary | ICD-10-CM | POA: Insufficient documentation

## 2023-05-29 DIAGNOSIS — J4541 Moderate persistent asthma with (acute) exacerbation: Principal | ICD-10-CM | POA: Insufficient documentation

## 2023-05-29 DIAGNOSIS — I110 Hypertensive heart disease with heart failure: Secondary | ICD-10-CM | POA: Diagnosis not present

## 2023-05-29 DIAGNOSIS — R0602 Shortness of breath: Secondary | ICD-10-CM | POA: Insufficient documentation

## 2023-05-29 DIAGNOSIS — G4733 Obstructive sleep apnea (adult) (pediatric): Secondary | ICD-10-CM | POA: Insufficient documentation

## 2023-05-29 DIAGNOSIS — Z6841 Body Mass Index (BMI) 40.0 and over, adult: Secondary | ICD-10-CM | POA: Insufficient documentation

## 2023-05-29 LAB — CBC WITH DIFFERENTIAL/PLATELET
Abs Immature Granulocytes: 0.07 10*3/uL (ref 0.00–0.07)
Basophils Absolute: 0.1 10*3/uL (ref 0.0–0.1)
Basophils Relative: 0 %
Eosinophils Absolute: 0.2 10*3/uL (ref 0.0–0.5)
Eosinophils Relative: 1 %
HCT: 42 % (ref 36.0–46.0)
Hemoglobin: 13.1 g/dL (ref 12.0–15.0)
Immature Granulocytes: 0 %
Lymphocytes Relative: 48 %
Lymphs Abs: 7.9 10*3/uL — ABNORMAL HIGH (ref 0.7–4.0)
MCH: 27.9 pg (ref 26.0–34.0)
MCHC: 31.2 g/dL (ref 30.0–36.0)
MCV: 89.6 fL (ref 80.0–100.0)
Monocytes Absolute: 1.4 10*3/uL — ABNORMAL HIGH (ref 0.1–1.0)
Monocytes Relative: 8 %
Neutro Abs: 7.3 10*3/uL (ref 1.7–7.7)
Neutrophils Relative %: 43 %
Platelets: 416 10*3/uL — ABNORMAL HIGH (ref 150–400)
RBC: 4.69 MIL/uL (ref 3.87–5.11)
RDW: 14.7 % (ref 11.5–15.5)
WBC: 16.9 10*3/uL — ABNORMAL HIGH (ref 4.0–10.5)
nRBC: 0 % (ref 0.0–0.2)

## 2023-05-29 LAB — RESP PANEL BY RT-PCR (RSV, FLU A&B, COVID)  RVPGX2
Influenza A by PCR: NEGATIVE
Influenza B by PCR: NEGATIVE
Resp Syncytial Virus by PCR: NEGATIVE
SARS Coronavirus 2 by RT PCR: NEGATIVE

## 2023-05-29 LAB — BASIC METABOLIC PANEL
Anion gap: 11 (ref 5–15)
BUN: 19 mg/dL (ref 6–20)
CO2: 21 mmol/L — ABNORMAL LOW (ref 22–32)
Calcium: 8.9 mg/dL (ref 8.9–10.3)
Chloride: 104 mmol/L (ref 98–111)
Creatinine, Ser: 0.95 mg/dL (ref 0.44–1.00)
GFR, Estimated: >60 mL/min (ref >60)
Glucose, Bld: 119 mg/dL — ABNORMAL HIGH (ref 70–99)
Potassium: 3.3 mmol/L — ABNORMAL LOW (ref 3.5–5.1)
Sodium: 136 mmol/L (ref 135–145)

## 2023-05-29 MED ORDER — IPRATROPIUM-ALBUTEROL 0.5-2.5 (3) MG/3ML IN SOLN: 3.0000 mL | RESPIRATORY_TRACT | Status: DC

## 2023-05-29 MED ORDER — METHYLPREDNISOLONE SODIUM SUCC 125 MG IJ SOLR: 60.0000 mg | Freq: Two times a day (BID) | INTRAMUSCULAR | Status: DC

## 2023-05-29 MED ORDER — CARVEDILOL 3.125 MG PO TABS: 6.2500 mg | ORAL_TABLET | Freq: Two times a day (BID) | ORAL | Status: DC

## 2023-05-29 MED ORDER — GABAPENTIN 300 MG PO CAPS: 300.0000 mg | ORAL_CAPSULE | Freq: Three times a day (TID) | ORAL | Status: DC

## 2023-05-29 MED ORDER — UMECLIDINIUM BROMIDE 62.5 MCG/ACT IN AEPB: 1.0000 | INHALATION_SPRAY | Freq: Every day | RESPIRATORY_TRACT | Status: DC

## 2023-05-29 MED ORDER — OXYCODONE-ACETAMINOPHEN 5-325 MG PO TABS: 2.0000 | ORAL_TABLET | Freq: Four times a day (QID) | ORAL | Status: DC | PRN

## 2023-05-29 MED ORDER — ALBUTEROL SULFATE (2.5 MG/3ML) 0.083% IN NEBU
10.0000 mg | INHALATION_SOLUTION | Freq: Once | RESPIRATORY_TRACT | Status: AC
  Administered 2023-05-29: 10 mg via RESPIRATORY_TRACT

## 2023-05-29 MED ORDER — MELATONIN 3 MG PO TABS: 6.0000 mg | ORAL_TABLET | Freq: Every evening | ORAL | Status: DC | PRN

## 2023-05-29 MED ORDER — ATORVASTATIN CALCIUM 10 MG PO TABS: 20.0000 mg | ORAL_TABLET | Freq: Every day | ORAL | Status: DC

## 2023-05-29 MED ORDER — CLONIDINE HCL 0.2 MG PO TABS: 0.2000 mg | ORAL_TABLET | Freq: Two times a day (BID) | ORAL | Status: DC

## 2023-05-29 MED ORDER — SODIUM CHLORIDE 0.9% FLUSH
3.0000 mL | Freq: Two times a day (BID) | INTRAVENOUS | Status: DC
Start: 2023-05-29 — End: 2023-05-29

## 2023-05-29 MED ORDER — POTASSIUM CHLORIDE CRYS ER 20 MEQ PO TBCR: 60.0000 meq | EXTENDED_RELEASE_TABLET | Freq: Once | ORAL | Status: DC

## 2023-05-29 MED ORDER — AMLODIPINE BESYLATE 5 MG PO TABS: 10.0000 mg | ORAL_TABLET | Freq: Every day | ORAL | Status: DC

## 2023-05-29 MED ORDER — IPRATROPIUM BROMIDE 0.02 % IN SOLN
0.5000 mg | Freq: Once | RESPIRATORY_TRACT | Status: AC
  Administered 2023-05-29: 0.5 mg via RESPIRATORY_TRACT
  Filled 2023-05-29: qty 2.5

## 2023-05-29 MED ORDER — ALBUTEROL SULFATE (2.5 MG/3ML) 0.083% IN NEBU
INHALATION_SOLUTION | RESPIRATORY_TRACT | Status: AC
  Filled 2023-05-29: qty 12

## 2023-05-29 MED ORDER — CLONAZEPAM 0.5 MG PO TABS: 0.5000 mg | ORAL_TABLET | Freq: Two times a day (BID) | ORAL | Status: DC | PRN

## 2023-05-29 MED ORDER — MONTELUKAST SODIUM 10 MG PO TABS: 10.0000 mg | ORAL_TABLET | Freq: Every day | ORAL | Status: DC

## 2023-05-29 MED ORDER — APIXABAN 5 MG PO TABS: 5.0000 mg | ORAL_TABLET | Freq: Two times a day (BID) | ORAL | Status: DC

## 2023-05-29 MED ORDER — ACETAMINOPHEN 500 MG PO TABS: 1000.0000 mg | ORAL_TABLET | Freq: Four times a day (QID) | ORAL | Status: DC | PRN

## 2023-05-29 MED ORDER — LEVOTHYROXINE SODIUM 25 MCG PO TABS: 50.0000 ug | ORAL_TABLET | Freq: Every day | ORAL | Status: DC

## 2023-05-29 MED ORDER — ALBUTEROL (5 MG/ML) CONTINUOUS INHALATION SOLN: 10.0000 mg/h | INHALATION_SOLUTION | Freq: Once | RESPIRATORY_TRACT | Status: DC

## 2023-05-29 MED ORDER — IPRATROPIUM-ALBUTEROL 0.5-2.5 (3) MG/3ML IN SOLN
3.0000 mL | Freq: Once | RESPIRATORY_TRACT | Status: AC
  Administered 2023-05-29: 3 mL via RESPIRATORY_TRACT
  Filled 2023-05-29: qty 3

## 2023-05-29 MED ORDER — ALBUTEROL SULFATE (2.5 MG/3ML) 0.083% IN NEBU: 2.5000 mg | INHALATION_SOLUTION | RESPIRATORY_TRACT | Status: DC | PRN

## 2023-05-29 MED ORDER — ZOLPIDEM TARTRATE 5 MG PO TABS: 5.0000 mg | ORAL_TABLET | Freq: Every evening | ORAL | Status: DC | PRN

## 2023-05-29 MED ORDER — TIZANIDINE HCL 4 MG PO TABS: 2.0000 mg | ORAL_TABLET | Freq: Three times a day (TID) | ORAL | Status: DC | PRN

## 2023-05-29 MED ORDER — FLUTICASONE FUROATE-VILANTEROL 200-25 MCG/ACT IN AEPB: 1.0000 | INHALATION_SPRAY | Freq: Every day | RESPIRATORY_TRACT | Status: DC

## 2023-05-29 MED ORDER — PANTOPRAZOLE SODIUM 40 MG PO TBEC: 40.0000 mg | DELAYED_RELEASE_TABLET | Freq: Every day | ORAL | Status: DC

## 2023-05-29 MED ORDER — FLUTICASONE PROPIONATE 50 MCG/ACT NA SUSP: 1.0000 | Freq: Every day | NASAL | Status: DC

## 2023-05-29 MED ORDER — ACETAMINOPHEN 500 MG PO TABS: 1000.0000 mg | ORAL_TABLET | ORAL | Status: DC

## 2023-05-29 NOTE — ED Notes (Signed)
Patient wheeled to restroom via wheelchair

## 2023-05-29 NOTE — ED Notes (Signed)
Admitting MD at bedside.

## 2023-05-29 NOTE — ED Triage Notes (Addendum)
Patient BIB [facility].  Patient reports that she called them because of a fall on Sunday, patient takes blood thinners and patient's PCP told her to go to the hospital because patient reports she's having trouble walking because of the pain in her legs. EMS reports that they found patient sob with wheezing and possible rhonchi.  Patient getting breathing treatment upon arrival, CAOx4.    140/p 98% RA Etco2 30 15 mg albuterol 500 mcg atrovent 125 mg solu-medrol 18 L AC

## 2023-05-29 NOTE — Care Management CC44 (Signed)
Condition Code 44 Documentation Completed  Patient Details  Name: AAVA DELAND MRN: [id number] Date of Birth: 02/28/1971   Condition Code 44 given:  Yes Patient signature on Condition Code 44 notice:  Yes Documentation of 2 MD's agreement:  Yes Code 44 added to claim:  Yes    Oletta Cohn, RN 05/29/2023, 2:30 PM

## 2023-05-29 NOTE — H&P (Signed)
History and Physical    April [name]:[id number] DOB: March 30, 1971 DOA: 05/29/2023  PCP: Hoy Register, MD   Patient coming from: Home   Chief Complaint:  Chief Complaint  Patient presents with   Shortness of Breath    HPI:  April [name] is a 53 y.o. female with hx of asthma,?  COPD overlap, atrial fibrillation on anticoagulation, hypothyroidism, morbid obesity, former smoker, who presents with worsening asthma symptoms over the last day as well as ground-level fall.  Called the EMS actually because of the ground-level fall but when they arrived had O2 sat in the mid 80s on room air and she reported ongoing asthma symptoms.  Regarding her fall, reports this was a mechanical fall due to not wearing socks/shoes on her hardwood floor.  Slipped and fell onto her bottom.  Reports having pain down both legs but worse in the knees.  Able to ambulate.  Regarding her asthma symptoms reports 1 day of worsening shortness of breath.  Does have recent history rhinorrhea, headaches.  Has a chronic cough which is unchanged.  No other recent illness   Review of Systems:  ROS complete and negative except as marked above   Allergies  Allergen Reactions   Other Shortness Of Breath and Other (See Comments)    Seasonal allergies- Wheezing   Contrast Media [Iodinated Contrast Media] Itching and Other (See Comments)    CT contrast    Prior to Admission medications   Medication Sig Start Date End Date Taking? Authorizing Provider  albuterol (VENTOLIN HFA) 108 (90 Base) MCG/ACT inhaler INHALE 2 PUFFS INTO THE LUNGS EVERY 6 HOURS AS NEEDED FOR WHEEZING OR SHORTNESS OF BREATH 04/26/23  Yes Young, Clinton D, MD  amLODipine (NORVASC) 10 MG tablet Take 1 tablet (10 mg total) by mouth daily. 02/05/23  Yes Hoy Register, MD  apixaban (ELIQUIS) 5 MG TABS tablet Take 1 tablet (5 mg total) by mouth 2 (two) times daily. 02/05/23  Yes Hoy Register, MD  atorvastatin (LIPITOR) 20 MG tablet Take 1 tablet (20 mg  total) by mouth daily. 02/05/23  Yes Hoy Register, MD  carvedilol (COREG) 6.25 MG tablet Take 1 tablet (6.25 mg total) by mouth 2 (two) times daily with a meal. 02/05/23  Yes Newlin, Enobong, MD  clonazePAM (KLONOPIN) 0.5 MG tablet Take 0.5 mg by mouth 2 (two) times daily as needed for anxiety.   Yes [provider]  famotidine (PEPCID) 40 MG tablet Take 40 mg by mouth at bedtime. 01/25/22  Yes [provider]  Fezolinetant (VEOZAH) 45 MG TABS Take 1 tablet (45 mg total) by mouth daily. 12/02/22  Yes Hoy Register, MD  Fluticasone-Umeclidin-Vilant (TRELEGY ELLIPTA) 200-62.5-25 MCG/ACT AEPB Inhale 1 puff into the lungs daily. 05/15/22  Yes Glenford Bayley, NP  furosemide (LASIX) 20 MG tablet TAKE 1 TABLET(20 MG) BY MOUTH DAILY 10/08/22  Yes Hoy Register, MD  gabapentin (NEURONTIN) 300 MG capsule Take 300 mg by mouth 3 (three) times daily.   Yes [provider]  ipratropium-albuterol (DUONEB) 0.5-2.5 (3) MG/3ML SOLN Take 3 mLs by nebulization every 4 (four) hours as needed (shortness of breath or wheezing). 01/26/23 07/25/23 Yes Carollee Herter, DO  levothyroxine (SYNTHROID) 50 MCG tablet Take 1 tablet (50 mcg total) by mouth daily before breakfast. 05/21/23  Yes Newlin, Enobong, MD  omeprazole (PRILOSEC) 40 MG capsule Take 40 mg by mouth at bedtime.   Yes [provider]  Semaglutide (RYBELSUS) 3 MG TABS Take 1 tablet (3 mg total) by mouth  daily. 02/05/23  Yes Newlin, Odette Horns, MD  zolpidem (AMBIEN) 10 MG tablet TAKE 1 TABLET BY MOUTH AT BEDTIME AS NEEDED FOR SLEEP 02/04/23  Yes Young, Clinton D, MD  EPINEPHrine 0.3 mg/0.3 mL IJ SOAJ injection Inject 0.3 mg into the muscle as needed for anaphylaxis. 05/30/20   Jetty Duhamel D, MD  fluticasone (FLONASE) 50 MCG/ACT nasal spray SHAKE LIQUID AND USE 1 SPRAY IN EACH NOSTRIL DAILY AS NEEDED FOR ALLERGIES OR RHINITIS Patient taking differently: Place 1 spray into both nostrils daily. 08/05/21   Hoy Register, MD  HYDROcodone  bit-homatropine (HYCODAN) 5-1.5 MG/5ML syrup Take 5 mLs by mouth every 6 (six) hours as needed for cough. Patient not taking: Reported on 05/20/2023 01/26/23   Carollee Herter, DO  montelukast (SINGULAIR) 10 MG tablet Take 1 tablet (10 mg total) by mouth at bedtime. 11/28/22   Hoy Register, MD  naloxone [facility]) nasal spray 4 mg/0.1 mL Place 1 spray into the nose once as needed (opioid overdose). 08/09/20   [provider]  oxyCODONE-acetaminophen (PERCOCET) 10-325 MG tablet Take 1 tablet by mouth 5 (five) times daily as needed for pain. Patient not taking: Reported on 05/20/2023 03/26/22     Potassium Chloride ER 20 MEQ TBCR Take 1 tablet by mouth daily. 11/24/22   [provider]  predniSONE (DELTASONE) 10 MG tablet Take 2 tablets (20 mg total) by mouth daily with breakfast. Patient not taking: Reported on 05/20/2023 02/07/23   [name], MD  pregabalin (LYRICA) 25 MG capsule Take 25 mg by mouth 2 (two) times daily. 11/12/22   [provider]  solifenacin (VESICARE) 5 MG tablet TAKE 1 TABLET(5 MG) BY MOUTH DAILY Patient not taking: Reported on 05/20/2023 02/05/23   Hoy Register, MD  Tezepelumab-ekko (TEZSPIRE) 210 MG/1. SOAJ Inject 210 mg into the skin every 28 (twenty-eight) days. 05/02/23   Jetty Duhamel D, MD  topiramate (TOPAMAX) 200 MG tablet Take 200 mg by mouth at bedtime. 01/08/21   [provider]  venlafaxine XR (EFFEXOR-XR) 75 MG 24 hr capsule TAKE 1 CAPSULE(75 MG) BY MOUTH DAILY WITH BREAKFAST FOR HOT FLASHES 02/11/23   Hoy Register, MD  Vitamin D, Ergocalciferol, (DRISDOL) 1.25 MG (50000 UNIT) CAPS capsule Take 50,000 Units by mouth every 7 (seven) days. 11/30/21   [provider]  mometasone-formoterol (DULERA) 200-5 MCG/ACT AERO Inhale 2 puffs into the lungs 2 (two) times daily. Dx: Asthma 04/08/19 08/12/19  [name], MD    Past Medical History:  Diagnosis Date   Acanthosis nigricans    Anxiety    Arthritis    "knees"  (04/28/2017)   Asthma    Followed by Dr. Maple Hudson (pulmonology); receives every other week omalizumab injections; has frequent exacerbations   Back pain    Chronic diastolic CHF (congestive heart failure) (HCC) 01/17/2017   COPD (chronic obstructive pulmonary disease) (HCC)    PFTs in 2002, FEV1/FVC 65, no post bronchodilater test done   Depression    GERD (gastroesophageal reflux disease)    Headache(784.0)    "q couple days" (04/28/2017)   Helicobacter pylori (H. pylori) infection    Hypertension, essential    Insomnia    Joint pain    Lower extremity edema    Menorrhagia    Morbid obesity (HCC)    OSA on CPAP    Sleep study 2008 - mild OSA, not enough events to titrate CPAP; wears CPAP now/pt on 04/28/2017   Pneumonia X 1   Prediabetes    Rheumatoid arthritis (HCC)  Seasonal allergies    Shortness of breath    Tobacco user    Vitamin D deficiency     Past Surgical History:  Procedure Laterality Date   CARDIOVERSION N/A 05/30/2017   Procedure: CARDIOVERSION;  Surgeon: Thurmon Fair, MD;  Location: [facility] END[facility];  Service: Cardiovascular;  Laterality: N/A;   REDUCTION MAMMAPLASTY Bilateral 09/2011   TUBAL LIGATION  1996   bilateral     reports that she quit smoking about 8 years ago. Her smoking use included cigarettes. She started smoking about 34 years ago. She has a 13 pack-year smoking history. She has never used smokeless tobacco. She reports that she does not drink alcohol and does not use drugs.  Family History  Problem Relation Age of Onset   Hypertension Mother    Asthma Daughter    Cancer Paternal Aunt    Asthma Maternal Grandmother      Physical Exam: Vitals:   05/29/23 0500 05/29/23 0530 05/29/23 0615 05/29/23 0645  BP: 122/78 (!) 159/85 (!) 143/76   Pulse: 91  (!) 105   Resp: 19 (!) 31 (!) 28   Temp:    100.1 F (37.8 C)  TempSrc:      SpO2:   96%   Weight:        Gen: Awake, alert, NAD   CV: Regular, normal S1, S2, no murmurs  Resp:  Tachypneic, increased work of breathing, on room air, decreased air movement and diffuse expiratory wheezes. Abd: Obese, normoactive, nontender MSK: Symmetric, no edema  Skin: No rashes or lesions to exposed skin  Neuro: Alert and interactive  Psych: euthymic, appropriate    Data review:   Labs reviewed, notable for:   WBC 16 Bicarb 21 no anion gap  Micro:  Results for orders placed or performed during the hospital encounter of 05/29/23  Resp panel by RT-PCR (RSV, Flu A&B, Covid) Anterior Nasal Swab     Status: None   Collection Time: 05/29/23  2:35 AM   Specimen: Anterior Nasal Swab  Result Value Ref Range Status   SARS Coronavirus 2 by RT PCR NEGATIVE NEGATIVE Final   Influenza A by PCR NEGATIVE NEGATIVE Final   Influenza B by PCR NEGATIVE NEGATIVE Final    Comment: (NOTE) The Xpert Xpress SARS-CoV-2/FLU/RSV plus assay is intended as an aid in the diagnosis of influenza from Nasopharyngeal swab specimens and should not be used as a sole basis for treatment. Nasal washings and aspirates are unacceptable for Xpert Xpress SARS-CoV-2/FLU/RSV testing.  Fact Sheet for Patients: BloggerCourse.com  Fact Sheet for Healthcare Providers: SeriousBroker.it  This test is not yet approved or cleared by the [place] FDA and has been authorized for detection and/or diagnosis of SARS-CoV-2 by FDA under an Emergency Use Authorization (EUA). This EUA will remain in effect (meaning this test can be used) for the duration of the COVID-19 declaration under Section 564(b)(1) of the Act, 21 U.S.C. section 360bbb-3(b)(1), unless the authorization is terminated or revoked.     Resp Syncytial Virus by PCR NEGATIVE NEGATIVE Final    Comment: (NOTE) Fact Sheet for Patients: BloggerCourse.com  Fact Sheet for Healthcare Providers: SeriousBroker.it  This test is not yet approved or cleared by  the [place] FDA and has been authorized for detection and/or diagnosis of SARS-CoV-2 by FDA under an Emergency Use Authorization (EUA). This EUA will remain in effect (meaning this test can be used) for the duration of the COVID-19 declaration under Section 564(b)(1) of the Act, 21 U.S.C. section 360bbb-3(b)(1), unless  the authorization is terminated or revoked.  Performed at [facility], [address], [place], [place] [address]    *Note: Due to a large number of results and/or encounters for the requested time period, some results have not been displayed. A complete set of results can be found in Results Review.    Imaging reviewed:  DG Knee Complete 4 Views Left Result Date: 05/29/2023 CLINICAL DATA:  Recent fall with left knee pain, initial encounter EXAM: LEFT KNEE - COMPLETE 4+ VIEW COMPARISON:  None Available. FINDINGS: Tricompartmental degenerative changes are noted. No joint effusion is seen. No acute fracture is noted. IMPRESSION: No acute abnormality noted.  Degenerative changes are seen. Electronically Signed   By: Alcide Clever M.D.   On: 05/29/2023 03:21   DG Knee Complete 4 Views Right Result Date: 05/29/2023 CLINICAL DATA:  Right knee pain following fall, initial encounter EXAM: RIGHT KNEE - COMPLETE 4+ VIEW COMPARISON:  None Available. FINDINGS: Tricompartmental degenerative changes are noted. No acute fracture or dislocation is noted. No soft tissue abnormality is seen. IMPRESSION: No acute abnormality noted.  Multifocal degenerative change. Electronically Signed   By: Alcide Clever M.D.   On: 05/29/2023 03:21   DG Chest Portable 1 View Result Date: 05/29/2023 CLINICAL DATA:  Fall several days ago with chest pain, initial encounter EXAM: PORTABLE CHEST 1 VIEW COMPARISON:  01/26/2023 FINDINGS: The heart size and mediastinal contours are within normal limits. Both lungs are clear. The visualized skeletal structures are unremarkable. IMPRESSION: No active disease.  Electronically Signed   By: Alcide Clever M.D.   On: 05/29/2023 03:20     ED Course:  Treated with Solu-Medrol by EMS, continuous neb, and additional neb treatments.  Due to her significant symptoms /functional limitation although not requiring oxygen referred for admission.   Assessment/Plan:  53 y.o. female with hx asthma,?  COPD overlap, atrial fibrillation on anticoagulation, hypothyroidism, morbid obesity, former smoker, who presents with asthma exacerbation, ground-level fall.   Asthma exacerbation History asthma,?  COPD overlap 1 day of worsening shortness of breath, additional URI symptoms.  Chronic cough unchanged.  On EMS arrival was mid 80s saturation on room air, currently with normal O2 sats on room air in the ED.  Otherwise tachycardic, tachypneic in the mid 20s.  Exam with decreased air movement and diffuse wheezes.  Chest x-ray without infiltrate -Continue methylprednisolone 60 mg IV q 12 hr until breathing improved, then transition to Prednisone 40 mg daily for 5 day total course  -Check RVP -Continue home Trelegy Ellipta equivalent, start DuoNebs every 4 hours scheduled, albuterol every 4 hours as needed, incentive spirometer, flutter valve, encourage out of bed to chair. -Continue home Singulair.  Note is also on outpatient Tezspire injections monthly  -Home O2 evaluation prior to discharge  Ground-level fall, mechanical Slipped on hardwood floors, fell onto her buttocks and reports pain mostly in both knees.  X-ray bilateral knees with degenerative changes but no fractures.  She is currently ambulatory. - Continue her home oxycodone-acetaminophen for pain relief - Currently do not feel she has PT needs  Chronic medical problems: Medication management: Pharm med history not completed at time of admission, completed based on discussion with her as well as fill history.  Attention to Pharm med history once completed History A-fib: Continue home Eliquis, Coreg   Hypothyroidism: Continue home levothyroxine Morbid obesity: On Rybelsus as outpatient.  Planning to have evaluation for bariatric surgery Hypertension: Continue home amlodipine, carvedilol (reports still taking both although last fill  greater than 3 months ago).continue clonidine 0.2 mg twice daily Chronic pain: Continue home oxycodone-acetaminophen 10/325 every 6 hours as needed, continue gabapentin 300 mg 3 times daily, tizanidine 2 mg 3 times daily as needed.  Reports no longer taking Lyrica regularly (takes prn) held for now. ?OAB: Hold on Vesicare for now Vasomotor symptoms: Hold home Vezoah while inpatient   Body mass index is 53.2 kg/m. Class III obesity    DVT prophylaxis:  Eliquis Code Status:  Full Code Diet:  Diet Orders (From admission, onward)     Start     Ordered   05/29/23 0630  Diet regular Room service appropriate? Yes; Fluid consistency: Thin  Diet effective now       Question Answer Comment  Room service appropriate? Yes   Fluid consistency: Thin      05/29/23 4098           Family Communication:  No   Consults:  None   Admission status:   Inpatient, Telemetry bed  Severity of Illness: The appropriate patient status for this patient is INPATIENT. Inpatient status is judged to be reasonable and necessary in order to provide the required intensity of service to ensure the patient's safety. The patient's presenting symptoms, physical exam findings, and initial radiographic and laboratory data in the context of their chronic comorbidities is felt to place them at high risk for further clinical deterioration. Furthermore, it is not anticipated that the patient will be medically stable for discharge from the hospital within 2 midnights of admission.   * I certify that at the point of admission it is my clinical judgment that the patient will require inpatient hospital care spanning beyond 2 midnights from the point of admission due to high intensity of service, high  risk for further deterioration and high frequency of surveillance required.*   Dolly Rias, MD [facility]  How to contact the [facility] Attending or Consulting provider 7A - 7P or covering provider during after hours 7P -7A, for this patient.  Check the care team in [facility] and look for a) attending/consulting [facility] provider listed and b) the [facility] team listed Log into [email or web address] and use [place]'s universal password to access. If you do not have the password, please contact the hospital operator. Locate the [facility] provider you are looking for under [facility] and page to a number that you can be directly reached. If you still have difficulty reaching the provider, please page the [facility] (Director on Call) for the Hospitalists listed on amion for assistance.  05/29/2023, 7:23 AM

## 2023-05-29 NOTE — ED Notes (Signed)
Patient sitting on edge of bed per request, patient states she feels better.  Patient assured RN that she will not try to get out of bed without assistance.

## 2023-05-29 NOTE — ED Provider Notes (Signed)
[place] EMERGENCY DEPARTMENT AT [facility] Provider Note   CSN: [id number] Arrival date & time: 05/29/23  4098     History  Chief Complaint  Patient presents with   Shortness of Breath    April [name] is a 53 y.o. female.  HPI     This is a 53 year old female with a history of asthma who presents with shortness of breath and concerns for a fall.  Patient reports that she fell on Sunday.  She reports slipping and falling in the kitchen.  She did not hit her head or lose consciousness.  She mostly states that she has bilateral leg pain.  She has been ambulatory at home.  She states that since yesterday she has had increasing shortness of breath.  Was febrile upon arrival.  Had noted chills at home and cough.  Has been using her inhaler with minimal relief.  She is not on home oxygen.  She received a DuoNeb, Solu-Medrol and Atrovent via EMS.  Home Medications Prior to Admission medications   Medication Sig Start Date End Date Taking? Authorizing Provider  albuterol (VENTOLIN HFA) 108 (90 Base) MCG/ACT inhaler INHALE 2 PUFFS INTO THE LUNGS EVERY 6 HOURS AS NEEDED FOR WHEEZING OR SHORTNESS OF BREATH 04/26/23  Yes Young, Clinton D, MD  amLODipine (NORVASC) 10 MG tablet Take 1 tablet (10 mg total) by mouth daily. 02/05/23  Yes Hoy Register, MD  apixaban (ELIQUIS) 5 MG TABS tablet Take 1 tablet (5 mg total) by mouth 2 (two) times daily. 02/05/23  Yes Hoy Register, MD  atorvastatin (LIPITOR) 20 MG tablet Take 1 tablet (20 mg total) by mouth daily. 02/05/23  Yes Hoy Register, MD  carvedilol (COREG) 6.25 MG tablet Take 1 tablet (6.25 mg total) by mouth 2 (two) times daily with a meal. 02/05/23  Yes Newlin, Enobong, MD  clonazePAM (KLONOPIN) 0.5 MG tablet Take 0.5 mg by mouth 2 (two) times daily as needed for anxiety.   Yes [provider]  famotidine (PEPCID) 40 MG tablet Take 40 mg by mouth at bedtime. 01/25/22  Yes [provider]  Fezolinetant (VEOZAH) 45  MG TABS Take 1 tablet (45 mg total) by mouth daily. 12/02/22  Yes Hoy Register, MD  Fluticasone-Umeclidin-Vilant (TRELEGY ELLIPTA) 200-62.5-25 MCG/ACT AEPB Inhale 1 puff into the lungs daily. 05/15/22  Yes Glenford Bayley, NP  furosemide (LASIX) 20 MG tablet TAKE 1 TABLET(20 MG) BY MOUTH DAILY 10/08/22  Yes Hoy Register, MD  gabapentin (NEURONTIN) 300 MG capsule Take 300 mg by mouth 3 (three) times daily.   Yes [provider]  ipratropium-albuterol (DUONEB) 0.5-2.5 (3) MG/3ML SOLN Take 3 mLs by nebulization every 4 (four) hours as needed (shortness of breath or wheezing). 01/26/23 07/25/23 Yes Carollee Herter, DO  levothyroxine (SYNTHROID) 50 MCG tablet Take 1 tablet (50 mcg total) by mouth daily before breakfast. 05/21/23  Yes Newlin, Enobong, MD  omeprazole (PRILOSEC) 40 MG capsule Take 40 mg by mouth at bedtime.   Yes [provider]  Semaglutide (RYBELSUS) 3 MG TABS Take 1 tablet (3 mg total) by mouth daily. 02/05/23  Yes Newlin, Odette Horns, MD  zolpidem (AMBIEN) 10 MG tablet TAKE 1 TABLET BY MOUTH AT BEDTIME AS NEEDED FOR SLEEP 02/04/23  Yes Young, Clinton D, MD  EPINEPHrine 0.3 mg/0.3 mL IJ SOAJ injection Inject 0.3 mg into the muscle as needed for anaphylaxis. 05/30/20   Jetty Duhamel D, MD  fluticasone (FLONASE) 50 MCG/ACT nasal spray SHAKE LIQUID AND USE 1 SPRAY IN [facility]  NOSTRIL DAILY AS NEEDED FOR ALLERGIES OR RHINITIS Patient taking differently: Place 1 spray into both nostrils daily. 08/05/21   Hoy Register, MD  HYDROcodone bit-homatropine (HYCODAN) 5-1.5 MG/5ML syrup Take 5 mLs by mouth every 6 (six) hours as needed for cough. Patient not taking: Reported on 05/20/2023 01/26/23   Carollee Herter, DO  montelukast (SINGULAIR) 10 MG tablet Take 1 tablet (10 mg total) by mouth at bedtime. 11/28/22   Hoy Register, MD  naloxone [facility]) nasal spray 4 mg/0.1 mL Place 1 spray into the nose once as needed (opioid overdose). 08/09/20   [provider]  oxyCODONE-acetaminophen  (PERCOCET) 10-325 MG tablet Take 1 tablet by mouth 5 (five) times daily as needed for pain. Patient not taking: Reported on 05/20/2023 03/26/22     Potassium Chloride ER 20 MEQ TBCR Take 1 tablet by mouth daily. 11/24/22   [provider]  predniSONE (DELTASONE) 10 MG tablet Take 2 tablets (20 mg total) by mouth daily with breakfast. Patient not taking: Reported on 05/20/2023 02/07/23   [name], MD  pregabalin (LYRICA) 25 MG capsule Take 25 mg by mouth 2 (two) times daily. 11/12/22   [provider]  solifenacin (VESICARE) 5 MG tablet TAKE 1 TABLET(5 MG) BY MOUTH DAILY Patient not taking: Reported on 05/20/2023 02/05/23   Hoy Register, MD  Tezepelumab-ekko (TEZSPIRE) 210 MG/1. SOAJ Inject 210 mg into the skin every 28 (twenty-eight) days. 05/02/23   Jetty Duhamel D, MD  topiramate (TOPAMAX) 200 MG tablet Take 200 mg by mouth at bedtime. 01/08/21   [provider]  venlafaxine XR (EFFEXOR-XR) 75 MG 24 hr capsule TAKE 1 CAPSULE(75 MG) BY MOUTH DAILY WITH BREAKFAST FOR HOT FLASHES 02/11/23   Hoy Register, MD  Vitamin D, Ergocalciferol, (DRISDOL) 1.25 MG (50000 UNIT) CAPS capsule Take 50,000 Units by mouth every 7 (seven) days. 11/30/21   [provider]  mometasone-formoterol (DULERA) 200-5 MCG/ACT AERO Inhale 2 puffs into the lungs 2 (two) times daily. Dx: Asthma 04/08/19 08/12/19  [name], MD      Allergies    Other and Contrast media [iodinated contrast media]    Review of Systems   Review of Systems  Constitutional:  Positive for fever.  Respiratory:  Positive for cough and shortness of breath.   Cardiovascular:  Negative for chest pain.  All other systems reviewed and are negative.   Physical Exam Updated Vital Signs BP 122/78   Pulse 91   Temp 100.2 F (37.9 C) (Oral)   Resp 19   Wt (!) 145 kg   SpO2 100%   BMI 53.20 kg/m  Physical Exam Vitals and nursing note reviewed.  Constitutional:      Appearance: She is  well-developed. She is obese. She is ill-appearing. She is not toxic-appearing.  HENT:     Head: Normocephalic and atraumatic.  Eyes:     Pupils: Pupils are equal, round, and reactive to light.  Cardiovascular:     Rate and Rhythm: Regular rhythm. Tachycardia present.     Heart sounds: Normal heart sounds.  Pulmonary:     Effort: Tachypnea present.     Breath sounds: No wheezing.     Comments: Speaking in short sentences, nasal cannula in place, tachypnea, poor air movement with an occasional expiratory wheeze Abdominal:     General: Bowel sounds are normal.     Palpations: Abdomen is soft.  Musculoskeletal:     Cervical back: Neck supple.     Comments: No obvious deformities of  bilateral lower extremities, normal range of motion of bilateral hips and knees, tenderness bilateral knees  Skin:    General: Skin is warm and dry.  Neurological:     Mental Status: She is alert and oriented to person, place, and time.  Psychiatric:        Mood and Affect: Mood normal.     ED Results / Procedures / Treatments   Labs (all labs ordered are listed, but only abnormal results are displayed) Labs Reviewed  CBC WITH DIFFERENTIAL/PLATELET - Abnormal; Notable for the following components:      Result Value   WBC 16.9 (*)    Platelets 416 (*)    Lymphs Abs 7.9 (*)    Monocytes Absolute 1.4 (*)    All other components within normal limits  BASIC METABOLIC PANEL - Abnormal; Notable for the following components:   Potassium 3.3 (*)    CO2 21 (*)    Glucose, Bld 119 (*)    All other components within normal limits  RESP PANEL BY RT-PCR (RSV, FLU A&B, COVID)  [id number]    EKG EKG Interpretation Date/Time:  Thursday May 29 2023 01:54:26 EST Ventricular Rate:  107 PR Interval:  157 QRS Duration:  104 QT Interval:  349 QTC Calculation: 466 R Axis:   -50  Text Interpretation: Sinus tachycardia Left anterior fascicular block LVH with secondary repolarization abnormality Anterior infarct,  old Confirmed by Ross Marcus ([id number]) on 05/29/2023 3:59:49 AM  Radiology DG Knee Complete 4 Views Left Result Date: 05/29/2023 CLINICAL DATA:  Recent fall with left knee pain, initial encounter EXAM: LEFT KNEE - COMPLETE 4+ VIEW COMPARISON:  None Available. FINDINGS: Tricompartmental degenerative changes are noted. No joint effusion is seen. No acute fracture is noted. IMPRESSION: No acute abnormality noted.  Degenerative changes are seen. Electronically Signed   By: Alcide Clever M.D.   On: 05/29/2023 03:21   DG Knee Complete 4 Views Right Result Date: 05/29/2023 CLINICAL DATA:  Right knee pain following fall, initial encounter EXAM: RIGHT KNEE - COMPLETE 4+ VIEW COMPARISON:  None Available. FINDINGS: Tricompartmental degenerative changes are noted. No acute fracture or dislocation is noted. No soft tissue abnormality is seen. IMPRESSION: No acute abnormality noted.  Multifocal degenerative change. Electronically Signed   By: Alcide Clever M.D.   On: 05/29/2023 03:21   DG Chest Portable 1 View Result Date: 05/29/2023 CLINICAL DATA:  Fall several days ago with chest pain, initial encounter EXAM: PORTABLE CHEST 1 VIEW COMPARISON:  01/26/2023 FINDINGS: The heart size and mediastinal contours are within normal limits. Both lungs are clear. The visualized skeletal structures are unremarkable. IMPRESSION: No active disease. Electronically Signed   By: Alcide Clever M.D.   On: 05/29/2023 03:20    Procedures .Critical Care  Performed by: Shon Baton, MD Authorized by: Shon Baton, MD   Critical care provider statement:    Critical care time (minutes):  31   Critical care was necessary to treat or prevent imminent or life-threatening deterioration of the following conditions:  Respiratory failure   Critical care was time spent personally by me on the following activities:  Development of treatment plan with patient or surrogate, discussions with consultants, evaluation of patient's  response to treatment, examination of patient, ordering and review of laboratory studies, ordering and review of radiographic studies, ordering and performing treatments and interventions, pulse oximetry, re-evaluation of patient's condition and review of old charts     Medications Ordered in ED Medications  ipratropium (ATROVENT) nebulizer solution  0.5 mg (0.5 mg Nebulization Given 05/29/23 0234)  albuterol (PROVENTIL) (2.5 MG/3ML) 0.083% nebulizer solution 10 mg ( Nebulization Canceled Entry 05/29/23 0245)  ipratropium-albuterol (DUONEB) 0.5-2.5 (3) MG/3ML nebulizer solution 3 mL (3 mLs Nebulization Given 05/29/23 0506)    ED Course/ Medical Decision Making/ A&P Clinical Course as of 05/29/23 0619  Thu May 29, 2023  0455 On recheck, patient is off oxygen but continues to have significant wheezing.  She appears tachypneic and is persistently tachycardic. [CH]  0455 Repeat DuoNeb ordered.  Will plan for admission to the hospital. [CH]    Clinical Course User Index [CH] Srihaan Mastrangelo, Mayer Masker, MD                                 Medical Decision Making Amount and/or Complexity of Data Reviewed Labs: ordered. Radiology: ordered.  Risk Prescription drug management. Decision regarding hospitalization.   This patient presents to the ED for concern of shortness of breath, fall, this involves an extensive number of treatment options, and is a complaint that carries with it a high risk of complications and morbidity.  I considered the following differential and admission for this acute, potentially life threatening condition.  The differential diagnosis includes asthma exacerbation, pneumonia, viral URI, traumatic injury  MDM:    This is a 53 year old female with significant history of asthma who presents with shortness of breath and a fall.  She is nontoxic.  Vital signs notable for temperature of 100.2.  She is tachypneic with poor air movement and occasional wheeze on exam.  She was given  Solu-Medrol and DuoNeb by EMS.  Continuous neb was administered.  She has a fairly significant history of asthma and status asthmaticus based on chart review.  Labs obtained and largely reassuring.  COVID and influenza negative.  Chest x-ray without pneumonia.  Bilateral knee films are negative for acute injury.  EKG shows no evidence of acute ischemia or arrhythmia.  Patient did have some improvement after continuous neb although she continues to wheeze and be fairly symptomatic.  Breath sounds are overall improved.  She is not requiring oxygen but does remain slightly tachypneic.  Feel she likely warrants admission for repeat DuoNebs and close monitoring for acute asthma exacerbation.  (Labs, imaging, consults)  Labs: I Ordered, and personally interpreted labs.  The pertinent results include: CBC, BMP, COVID, influenza  Imaging Studies ordered: I ordered imaging studies including chest x-ray I independently visualized and interpreted imaging. I agree with the radiologist interpretation  Additional history obtained from chart review.  External records from outside source obtained and reviewed including primary notes  Cardiac Monitoring: The patient was maintained on a cardiac monitor.  If on the cardiac monitor, I personally viewed and interpreted the cardiac monitored which showed an underlying rhythm of: Sinus  Reevaluation: After the interventions noted above, I reevaluated the patient and found that they have : Gradual improvement, persistent wheezing  Social Determinants of Health:  lives independently  Disposition: Admit  Co morbidities that complicate the patient evaluation  Past Medical History:  Diagnosis Date   Acanthosis nigricans    Anxiety    Arthritis    "knees" (04/28/2017)   Asthma    Followed by Dr. Maple Hudson (pulmonology); receives every other week omalizumab injections; has frequent exacerbations   Back pain    Chronic diastolic CHF (congestive heart failure) (HCC)  01/17/2017   COPD (chronic obstructive pulmonary disease) (HCC)    PFTs  in 2002, FEV1/FVC 65, no post bronchodilater test done   Depression    GERD (gastroesophageal reflux disease)    Headache(784.0)    "q couple days" (04/28/2017)   Helicobacter pylori (H. pylori) infection    Hypertension, essential    Insomnia    Joint pain    Lower extremity edema    Menorrhagia    Morbid obesity (HCC)    OSA on CPAP    Sleep study 2008 - mild OSA, not enough events to titrate CPAP; wears CPAP now/pt on 04/28/2017   Pneumonia X 1   Prediabetes    Rheumatoid arthritis (HCC)    Seasonal allergies    Shortness of breath    Tobacco user    Vitamin D deficiency      Medicines Meds ordered this encounter  Medications   DISCONTD: albuterol (PROVENTIL,VENTOLIN) solution continuous neb   ipratropium (ATROVENT) nebulizer solution 0.5 mg   albuterol (PROVENTIL) (2.5 MG/3ML) 0.083% nebulizer solution 10 mg   albuterol (PROVENTIL) (2.5 MG/3ML) 0.083% nebulizer solution    Anastasia Fiedler, Atkinson : cabinet override   ipratropium-albuterol (DUONEB) 0.5-2.5 (3) MG/3ML nebulizer solution 3 mL    I have reviewed the patients home medicines and have made adjustments as needed  Problem List / ED Course: Problem List Items Addressed This Visit       Respiratory   Moderate persistent asthma with exacerbation - Primary                Final Clinical Impression(s) / ED Diagnoses Final diagnoses:  Moderate persistent asthma with exacerbation    Rx / DC Orders ED Discharge Orders     None         Cipriano Millikan, Mayer Masker, MD 05/29/23 [phone]

## 2023-05-29 NOTE — Hospital Course (Signed)
April [name] is a 53 yo female with PMH asthma, OSA, COPD, depression/anxiety, back pain/joint pain, morbid obesity who presented with shortness of breath and a fall at home.  She had endorsed losing her footing on hardwood floor and falling onto her bottom.  She has difficulty with pain in her knees at baseline. Due to her shortness of breath, she did not feel comfortable with discharging home from the ER.  CXR was unremarkable.  X-rays of her knees showed tricompartmental degenerative changes. She was started on breathing treatments and steroids.  After observation overnight, she felt subjectively improved in the morning and was comfortable discharging home.  She had no noted wheezing and ambulated well with nursing with no desaturations.

## 2023-05-29 NOTE — Discharge Summary (Signed)
Physician Discharge Summary   April [name] HYQ:[id number] DOB: [birth year] DOA: 05/29/2023  PCP: April Register, MD  Admit date: 05/29/2023 Discharge date: 05/29/2023   Admitted From: Home Disposition:  Home Discharging physician: Lewie Chamber, MD Barriers to discharge: none  Discharge Condition: stable CODE STATUS: Full Diet recommendation:  Diet Orders (From admission, onward)     Start     Ordered   05/29/23 0630  Diet regular Room service appropriate? Yes; Fluid consistency: Thin  Diet effective now       Question Answer Comment  Room service appropriate? Yes   Fluid consistency: Thin      05/29/23 9528   05/29/23 0000  Diet general        05/29/23 1300            Hospital Course: [name] is a 53 yo female with PMH asthma, OSA, COPD, depression/anxiety, back pain/joint pain, morbid obesity who presented with shortness of breath and a fall at home.  She had endorsed losing her footing on hardwood floor and falling onto her bottom.  She has difficulty with pain in her knees at baseline. Due to her shortness of breath, she did not feel comfortable with discharging home from the ER.  CXR was unremarkable.  X-rays of her knees showed tricompartmental degenerative changes. She was started on breathing treatments and steroids.  After observation overnight, she felt subjectively improved in the morning and was comfortable discharging home.  She had no noted wheezing and ambulated well with nursing with no desaturations.   The patient's acute and chronic medical conditions were treated accordingly. On day of discharge, patient was felt deemed stable for discharge. Patient/family member advised to call PCP or come back to ER if needed.   Principal Diagnosis: <principal problem not specified>  Discharge Diagnoses: Active Hospital Problems   Diagnosis Date Noted   Acute asthma exacerbation 07/19/2022    Priority: High   Asthma exacerbation 05/29/2023    Resolved Hospital  Problems  No resolved problems to display.     Discharge Instructions     Diet general   Complete by: As directed    Increase activity slowly   Complete by: As directed       Allergies as of 05/29/2023       Reactions   Other Shortness Of Breath, Other (See Comments)   Seasonal allergies- Wheezing   Contrast Media [iodinated Contrast Media] Itching, Other (See Comments)   CT contrast        Medication List     TAKE these medications    albuterol 108 (90 Base) MCG/ACT inhaler Commonly known as: VENTOLIN HFA INHALE 2 PUFFS INTO THE LUNGS EVERY 6 HOURS AS NEEDED FOR WHEEZING OR SHORTNESS OF BREATH   amLODipine 10 MG tablet Commonly known as: NORVASC Take 1 tablet (10 mg total) by mouth daily.   apixaban 5 MG Tabs tablet Commonly known as: ELIQUIS Take 1 tablet (5 mg total) by mouth 2 (two) times daily.   atorvastatin 20 MG tablet Commonly known as: LIPITOR Take 1 tablet (20 mg total) by mouth daily.   carvedilol 6.25 MG tablet Commonly known as: COREG Take 1 tablet (6.25 mg total) by mouth 2 (two) times daily with a meal.   clonazePAM 0.5 MG tablet Commonly known as: KLONOPIN Take 0.5 mg by mouth 2 (two) times daily as needed for anxiety.   cloNIDine 0.2 MG tablet Commonly known as: CATAPRES Take 0.2 mg by mouth in the morning and at bedtime.  famotidine 40 MG tablet Commonly known as: PEPCID Take 40 mg by mouth at bedtime.   fluticasone 50 MCG/ACT nasal spray Commonly known as: FLONASE SHAKE LIQUID AND USE 1 SPRAY IN EACH NOSTRIL DAILY AS NEEDED FOR ALLERGIES OR RHINITIS   furosemide 20 MG tablet Commonly known as: LASIX TAKE 1 TABLET(20 MG) BY MOUTH DAILY   gabapentin 300 MG capsule Commonly known as: NEURONTIN Take 300 mg by mouth 3 (three) times daily.   ipratropium-albuterol 0.5-2.5 (3) MG/3ML Soln Commonly known as: DUONEB Take 3 mLs by nebulization every 4 (four) hours as needed (shortness of breath or wheezing).   levothyroxine 50 MCG  tablet Commonly known as: SYNTHROID Take 1 tablet (50 mcg total) by mouth daily before breakfast.   methocarbamol 750 MG tablet Commonly known as: ROBAXIN Take 750 mg by mouth 3 (three) times daily.   montelukast 10 MG tablet Commonly known as: SINGULAIR Take 1 tablet (10 mg total) by mouth at bedtime.   naloxone 4 MG/0.1ML Liqd nasal spray kit Commonly known as: NARCAN Place 1 spray into the nose once as needed (opioid overdose).   omeprazole 40 MG capsule Commonly known as: PRILOSEC Take 40 mg by mouth at bedtime.   oxyCODONE-acetaminophen 10-325 MG tablet Commonly known as: PERCOCET Take 1 tablet by mouth 5 (five) times daily as needed for pain.   Potassium Chloride ER 20 MEQ Tbcr Take 1 tablet by mouth daily.   pregabalin 50 MG capsule Commonly known as: LYRICA Take 50 mg by mouth 3 (three) times daily as needed.   Rybelsus 3 MG Tabs Generic drug: Semaglutide Take 1 tablet (3 mg total) by mouth daily.   solifenacin 5 MG tablet Commonly known as: VESICARE TAKE 1 TABLET(5 MG) BY MOUTH DAILY   Tezspire 210 MG/1. Soaj Generic drug: Tezepelumab-ekko Inject 210 mg into the skin every 28 (twenty-eight) days.   tiZANidine 2 MG tablet Commonly known as: ZANAFLEX Take 2 mg by mouth 3 (three) times daily as needed.   topiramate 200 MG tablet Commonly known as: TOPAMAX Take 200 mg by mouth at bedtime.   Trelegy Ellipta 200-62.5-25 MCG/ACT Aepb Generic drug: Fluticasone-Umeclidin-Vilant Inhale 1 puff into the lungs daily.   venlafaxine XR 75 MG 24 hr capsule Commonly known as: EFFEXOR-XR TAKE 1 CAPSULE(75 MG) BY MOUTH DAILY WITH BREAKFAST FOR HOT FLASHES   Veozah 45 MG Tabs Generic drug: Fezolinetant Take 1 tablet (45 mg total) by mouth daily.   Vitamin D (Ergocalciferol) 1.25 MG (50000 UNIT) Caps capsule Commonly known as: DRISDOL Take 50,000 Units by mouth every 7 (seven) days.   zolpidem 10 MG tablet Commonly known as: AMBIEN TAKE 1 TABLET BY MOUTH  AT BEDTIME AS NEEDED FOR SLEEP        Allergies  Allergen Reactions   Other Shortness Of Breath and Other (See Comments)    Seasonal allergies- Wheezing   Contrast Media [Iodinated Contrast Media] Itching and Other (See Comments)    CT contrast    Consultations:  Procedures:   Discharge Exam: BP (!) 161/91 (BP Location: Right Arm)   Pulse (!) 115   Temp 98.3 F (36.8 C) (Oral)   Resp (!) 23   Wt (!) 145 kg   SpO2 99%   BMI 53.20 kg/m  Physical Exam Constitutional:      Appearance: Normal appearance. She is obese.  HENT:     Head: Normocephalic and atraumatic.     Mouth/Throat:     Mouth: Mucous membranes are moist.  Eyes:  Extraocular Movements: Extraocular movements intact.  Cardiovascular:     Rate and Rhythm: Normal rate and regular rhythm.  Pulmonary:     Effort: Pulmonary effort is normal. No respiratory distress.     Breath sounds: Normal breath sounds. No wheezing.  Abdominal:     General: Bowel sounds are normal. There is no distension.     Palpations: Abdomen is soft.     Tenderness: There is no abdominal tenderness.  Musculoskeletal:        General: Normal range of motion.     Cervical back: Normal range of motion and neck supple.  Skin:    General: Skin is warm and dry.  Neurological:     General: No focal deficit present.     Mental Status: She is alert.  Psychiatric:        Mood and Affect: Mood normal.      The results of significant diagnostics from this hospitalization (including imaging, microbiology, ancillary and laboratory) are listed below for reference.   Microbiology: Recent Results (from the past 240 hours)  Resp panel by RT-PCR (RSV, Flu A&B, Covid) Anterior Nasal Swab     Status: None   Collection Time: 05/29/23  2:35 AM   Specimen: Anterior Nasal Swab  Result Value Ref Range Status   SARS Coronavirus 2 by RT PCR NEGATIVE NEGATIVE Final   Influenza A by PCR NEGATIVE NEGATIVE Final   Influenza B by PCR NEGATIVE NEGATIVE  Final    Comment: (NOTE) The Xpert Xpress SARS-CoV-2/FLU/RSV plus assay is intended as an aid in the diagnosis of influenza from Nasopharyngeal swab specimens and should not be used as a sole basis for treatment. Nasal washings and aspirates are unacceptable for Xpert Xpress SARS-CoV-2/FLU/RSV testing.  Fact Sheet for Patients: BloggerCourse.com  Fact Sheet for Healthcare Providers: SeriousBroker.it  This test is not yet approved or cleared by the [place] FDA and has been authorized for detection and/or diagnosis of SARS-CoV-2 by FDA under an Emergency Use Authorization (EUA). This EUA will remain in effect (meaning this test can be used) for the duration of the COVID-19 declaration under Section 564(b)(1) of the Act, 21 U.S.C. section 360bbb-3(b)(1), unless the authorization is terminated or revoked.     Resp Syncytial Virus by PCR NEGATIVE NEGATIVE Final    Comment: (NOTE) Fact Sheet for Patients: BloggerCourse.com  Fact Sheet for Healthcare Providers: SeriousBroker.it  This test is not yet approved or cleared by the [place] FDA and has been authorized for detection and/or diagnosis of SARS-CoV-2 by FDA under an Emergency Use Authorization (EUA). This EUA will remain in effect (meaning this test can be used) for the duration of the COVID-19 declaration under Section 564(b)(1) of the Act, 21 U.S.C. section 360bbb-3(b)(1), unless the authorization is terminated or revoked.  Performed at [facility], [address] [place], [place] [address]      Labs: BNP (last 3 results) Recent Labs    12/04/22 2332 01/23/23 1707  BNP 68.9 49.5   Basic Metabolic Panel: Recent Labs  Lab 05/29/23 0150  NA 136  K 3.3*  CL 104  CO2 21*  GLUCOSE 119*  BUN 19  CREATININE 0.95  CALCIUM 8.9   Liver Function Tests: No results for input(s): "AST", "ALT",  "ALKPHOS", "BILITOT", "PROT", "ALBUMIN" in the last 168 hours. No results for input(s): "LIPASE", "AMYLASE" in the last 168 hours. No results for input(s): "AMMONIA" in the last 168 hours. CBC: Recent Labs  Lab 05/29/23 0150  WBC 16.9*  NEUTROABS 7.3  HGB 13.1  HCT 42.0  MCV 89.6  PLT 416*   Cardiac Enzymes: No results for input(s): "CKTOTAL", "CKMB", "CKMBINDEX", "TROPONINI" in the last 168 hours. BNP: Invalid input(s): "POCBNP" CBG: No results for input(s): "GLUCAP" in the last 168 hours. D-Dimer No results for input(s): "DDIMER" in the last 72 hours. Hgb A1c No results for input(s): "HGBA1C" in the last 72 hours. Lipid Profile No results for input(s): "CHOL", "HDL", "LDLCALC", "TRIG", "CHOLHDL", "LDLDIRECT" in the last 72 hours. Thyroid function studies No results for input(s): "TSH", "T4TOTAL", "T3FREE", "THYROIDAB" in the last 72 hours.  Invalid input(s): "FREET3" Anemia work up No results for input(s): "VITAMINB12", "FOLATE", "FERRITIN", "TIBC", "IRON", "RETICCTPCT" in the last 72 hours. Urinalysis    Component Value Date/Time   COLORURINE YELLOW 10/23/2021 1826   APPEARANCEUR CLOUDY (A) 10/23/2021 1826   LABSPEC 1.027 10/23/2021 1826   PHURINE 5.0 10/23/2021 1826   GLUCOSEU NEGATIVE 10/23/2021 1826   GLUCOSEU NEG mg/dL 29/56/2130 8657   HGBUR NEGATIVE 10/23/2021 1826   BILIRUBINUR NEGATIVE 10/23/2021 1826   KETONESUR NEGATIVE 10/23/2021 1826   PROTEINUR NEGATIVE 10/23/2021 1826   UROBILINOGEN 1.0 11/21/2014 0707   NITRITE NEGATIVE 10/23/2021 1826   LEUKOCYTESUR LARGE (A) 10/23/2021 1826   Sepsis Labs Recent Labs  Lab 05/29/23 0150  WBC 16.9*   Microbiology Recent Results (from the past 240 hours)  Resp panel by RT-PCR (RSV, Flu A&B, Covid) Anterior Nasal Swab     Status: None   Collection Time: 05/29/23  2:35 AM   Specimen: Anterior Nasal Swab  Result Value Ref Range Status   SARS Coronavirus 2 by RT PCR NEGATIVE NEGATIVE Final   Influenza A by PCR  NEGATIVE NEGATIVE Final   Influenza B by PCR NEGATIVE NEGATIVE Final    Comment: (NOTE) The Xpert Xpress SARS-CoV-2/FLU/RSV plus assay is intended as an aid in the diagnosis of influenza from Nasopharyngeal swab specimens and should not be used as a sole basis for treatment. Nasal washings and aspirates are unacceptable for Xpert Xpress SARS-CoV-2/FLU/RSV testing.  Fact Sheet for Patients: BloggerCourse.com  Fact Sheet for Healthcare Providers: SeriousBroker.it  This test is not yet approved or cleared by the [place] FDA and has been authorized for detection and/or diagnosis of SARS-CoV-2 by FDA under an Emergency Use Authorization (EUA). This EUA will remain in effect (meaning this test can be used) for the duration of the COVID-19 declaration under Section 564(b)(1) of the Act, 21 U.S.C. section 360bbb-3(b)(1), unless the authorization is terminated or revoked.     Resp Syncytial Virus by PCR NEGATIVE NEGATIVE Final    Comment: (NOTE) Fact Sheet for Patients: BloggerCourse.com  Fact Sheet for Healthcare Providers: SeriousBroker.it  This test is not yet approved or cleared by the [place] FDA and has been authorized for detection and/or diagnosis of SARS-CoV-2 by FDA under an Emergency Use Authorization (EUA). This EUA will remain in effect (meaning this test can be used) for the duration of the COVID-19 declaration under Section 564(b)(1) of the Act, 21 U.S.C. section 360bbb-3(b)(1), unless the authorization is terminated or revoked.  Performed at [facility], [address] [place], [place] [address]     Procedures/Studies: DG Knee Complete 4 Views Left Result Date: 05/29/2023 CLINICAL DATA:  Recent fall with left knee pain, initial encounter EXAM: LEFT KNEE - COMPLETE 4+ VIEW COMPARISON:  None Available. FINDINGS: Tricompartmental degenerative changes  are noted. No joint effusion is seen. No acute fracture is noted. IMPRESSION: No acute abnormality noted.  Degenerative changes are seen. Electronically Signed   By: Alcide Clever M.D.   On: 05/29/2023 03:21   DG Knee Complete 4 Views Right Result Date: 05/29/2023 CLINICAL DATA:  Right knee pain following fall, initial encounter EXAM: RIGHT KNEE - COMPLETE 4+ VIEW COMPARISON:  None Available. FINDINGS: Tricompartmental degenerative changes are noted. No acute fracture or dislocation is noted. No soft tissue abnormality is seen. IMPRESSION: No acute abnormality noted.  Multifocal degenerative change. Electronically Signed   By: Alcide Clever M.D.   On: 05/29/2023 03:21   DG Chest Portable 1 View Result Date: 05/29/2023 CLINICAL DATA:  Fall several days ago with chest pain, initial encounter EXAM: PORTABLE CHEST 1 VIEW COMPARISON:  01/26/2023 FINDINGS: The heart size and mediastinal contours are within normal limits. Both lungs are clear. The visualized skeletal structures are unremarkable. IMPRESSION: No active disease. Electronically Signed   By: Alcide Clever M.D.   On: 05/29/2023 03:20     Time coordinating discharge: Over 30 minutes    Lewie Chamber, MD  [facility] 05/29/2023, 3:53 PM

## 2023-05-29 NOTE — ED Notes (Signed)
Pt is being discharged home, cardiac monitoring order discontinued as patient refuses to stay hooked up to monitor

## 2023-05-29 NOTE — Care Management Obs Status (Signed)
MEDICARE OBSERVATION STATUS NOTIFICATION   Patient Details  Name: April [name] MRN: [id number] Date of Birth: 02/17/1971   Medicare Observation Status Notification Given:  Yes    Oletta Cohn, RN 05/29/2023, 2:29 PM

## 2023-05-30 LAB — RESPIRATORY PANEL BY PCR

## 2023-06-03 ENCOUNTER — Other Ambulatory Visit: Payer: Self-pay

## 2023-06-03 ENCOUNTER — Ambulatory Visit (HOSPITAL_COMMUNITY): Payer: 59 | Admitting: Licensed Clinical Social Worker

## 2023-06-03 NOTE — Progress Notes (Signed)
 Specialty Pharmacy Ongoing Clinical Assessment Note  April [name] is a 53 y.o. female who is being followed by the specialty pharmacy service for RxSp Asthma/COPD   Patient's specialty medication(s) reviewed today: Tezepelumab -ekko (Tezspire )   Missed doses in the last 4 weeks: 0   Patient/Caregiver did not have any additional questions or concerns.   Therapeutic benefit summary: Patient is achieving benefit   Adverse events/side effects summary: No adverse events/side effects   Patient's therapy is appropriate to: Continue    Goals Addressed             This Visit's Progress    Reduce disease symptoms including coughing and shortness of breath       Patient is on track. Patient will maintain adherence         Follow up:  6 months  Kellina Dreese M Margarit Minshall Specialty Pharmacist

## 2023-06-03 NOTE — Progress Notes (Signed)
 Specialty Pharmacy Refill Coordination Note  April [name] is a 53 y.o. female assessed today regarding refills of clinic administered specialty medication(s) Tezepelumab -ekko (Tezspire )   Clinic requested Courier to Provider Office   Delivery date: 06/06/23   Verified address: [employer] [employer] [address]   Medication will be filled on 06/05/23.

## 2023-06-04 ENCOUNTER — Ambulatory Visit (HOSPITAL_COMMUNITY): Payer: 59 | Admitting: Licensed Clinical Social Worker

## 2023-06-06 ENCOUNTER — Telehealth: Payer: Self-pay

## 2023-06-06 NOTE — Progress Notes (Signed)
 Patient ID: April [name], female    DOB: 1970/06/16, 53 y.o.   MRN: [id number]   HPI F former smoker followed for chronic severe asthma/ Xolair  complicated by non-compliance,  OSA/ CPAP,  morbid obesity, HBP, depression, GERD, AFib/ Eliquis   NPSG 03/31/07- AHI 5.8/ hr, weight 330 lbs. She canceled planned more recent study. Office spirometry 10/24/14- severe obstructive airways disease. FVC 2.10/66%, FEV1 1.25/47%, FEV1/FVC 0.59, FEF 25-75 percent 0.64/19% Unattended HST 07/06/16- AHI 11/hour, desaturation to 74%. Office Spirometry 07/29/2016-moderately severe obstructive airways disease. FVC 2.21/68%, FEV1 1.53/57%, ratio 0.69, FEF 25-75% 0.98/34% Prolonged hosp [facility] 4/19- tracheostomy. Dupixent  enrolled 10/22/2018> Tezspire  2023 HST 12/26/2018- AHI 9.7/ hr, desaturation to 9.7/ hr, desaturation to 69% NPSG 05/19/22- AHI 0/ hr, desat to 95% Primary Snoring, body weight 315 lbs -------------------------------------------------------   02/04/23- 53 year old female Smoker followed for chronic severe Asthma/ COPD/Xolair , Chronic Hypoxic Respiratory Failure, Tracheostomy at [facility] 2019, Insomnia,  complicated by noncompliance, OSA/ oral appliance, morbid Obesity, HTN, depression, GERD Covid infection, PAFib/ Eliquis , dCHF, Dyslipidemia, Depression,  Dupixent  enrolled 10/22/2018 changed to Tezspire  2023. O2 2L prn -Proair  hfa, Trelegy  200, Singulair , Neb Brovana , Tezspire , Ambien  10, Vyvanse , Neb Brovana ?, Phentermine , Clonazepam ,  Body weight today- 304 lbs Hosp 9/26-9/29- Asthma exacerb after exposed to sick grandchild. Suspected Obesity Hypoventilation. > Zith, pred. ACT score 6 Daughter recently passed away- ?seizure? Not currently on prednisone . Continues Tezspire  and says it helps.. She is interested in local referral for bariatric surgery. At one point she was working with someone in [place], but apparently that fell through. We all feel that part of her respiratory distress is really  obesity hypoventilation and deconditioning. Asks refill ambien  for insomnia- discussed. Flu vaccine today CXR 01/26/23- 1V IMPRESSION: No acute cardiopulmonary disease.  06/09/23- 53 year old female Smoker followed for chronic severe Asthma/ COPD/Xolair , Chronic Hypoxic Respiratory Failure, Tracheostomy at [facility] 2019, Tobacco Use, Insomnia,  complicated by noncompliance, OSA/ oral appliance, morbid Obesity, HTN, depression, GERD Covid infection, PAFib/ Eliquis , dCHF, Dyslipidemia, Depression,  Dupixent  enrolled 10/22/2018 changed to Tezspire  2023. O2 2L prn -Proair  hfa, Trelegy  200, Singulair , Neb Brovana , Tezspire , Ambien  10,  Body weight today-  Last hosp asthma d/c 1/3-/25 CXR 05/29/23- FINDINGS: The heart size and mediastinal contours are within normal limits. Both lungs are clear. The visualized skeletal structures are unremarkable. IMPRESSION: No active disease. Discussed the use of AI scribe software for clinical note transcription with the patient, who gave verbal consent to proceed.  History of Present Illness   The patient, with a history of asthma and anxiety, presents for clearance for bariatric surgery. She reports a recent fall at home, but denies any injury to the knee or other complications. She is currently in the third month of a six-month nutrition program in preparation for the surgery, focusing on increasing protein intake and improving overall diet. She expresses some difficulty distinguishing between symptoms of asthma and anxiety, but reports feeling well at the time of the visit.  The patient also reports difficulty sleeping, despite taking Ambien  nightly. She attributes some of this difficulty to the recent death of her daughter, the cause of which is currently unknown pending autopsy results. The patient also mentions a relationship with a pain management clinic, but does not elaborate on the reason for this treatment.     Review of Systems-See HPI  + =  positive Constitutional:   + weight loss, night sweats, fevers, chills, +fatigue, lassitude. HEENT:   No-  headaches, difficulty swallowing, tooth/dental problems, sore throat,  sneezing, itching, ear ache, +nasal congestion, post nasal drip,  CV:  No-   chest pain, orthopnea, PND, swelling in lower extremities, anasarca, dizziness, palpitations Resp: +shortness of breath with exertion or at rest.              productive cough,  + non-productive cough,  No- coughing up of blood.              No-   change in color of mucus. +wheezing.   Skin: No-   rash or lesions. GI:  No-   heartburn, indigestion, abdominal pain, nausea, vomiting,  GU: MS:  No-   joint pain or swelling.   Neuro-     nothing unusual Psych:  No- change in mood or affect. No depression or anxiety.  No memory loss.    Objective:   Physical Exam    General- Alert, Oriented, Affect-appropriate, Distress- none acute,  + morbid  obesity,  Skin- rash-none, lesions- none, excoriation- none Lymphadenopathy- none Head- atraumatic            Eyes- Gross vision intact, PERRLA, conjunctivae clear secretions            Ears- Hearing, canals-normal            Nose- Clear, no-Septal dev, mucus, polyps, erosion, perforation.            Throat- Mallampati III , mucosa clear , drainage- none, tonsils- atrophic,                           + Missing  teeth,                 Neck- flexible , trachea+ stoma scar, no stridor , thyroid nl, carotid no bruit Chest - symmetrical excursion , unlabored           Heart/CV- RRR , no murmur , no gallop  , no rub, nl s1 s2                           - JVD- none , edema- none, stasis changes- none, varices- none           Lung- +Diminished, . Wheeze-none, cough-none,  dullness-none,  rub- none            Chest wall Abd-  Br/ Gen/ Rectal- Not done, not indicated Extrem- cyanosis- none, clubbing, none, atrophy- none, strength- nl Neuro- grossly intact to observation Assessment and Plan     Asthma Stable with clear lung sounds on examination. Currently on Tezspire  and Trelegy 200. -Continue current regimen. -Refill Trelegy 200 for 1 year.  Insomnia Patient reports poor sleep despite taking Ambien . Possible contributing factors include recent bereavement. -Consider non-pharmacological interventions for insomnia such as reading before bed.  Bariatric Surgery Patient is in the process of obtaining clearance for bariatric surgery. Currently in the third month of a six-month nutrition program. -Continue with nutrition program. -Schedule follow-up in three months to assess progress and provide any necessary documentation for surgery.  Bereavement Patient's daughter passed away recently. Autopsy results are pending. -Consider referral for grief counseling if patient is open to it.

## 2023-06-09 ENCOUNTER — Encounter: Payer: Self-pay | Admitting: Internal Medicine

## 2023-06-09 ENCOUNTER — Ambulatory Visit (INDEPENDENT_AMBULATORY_CARE_PROVIDER_SITE_OTHER): Payer: 59 | Admitting: Internal Medicine

## 2023-06-09 ENCOUNTER — Ambulatory Visit: Payer: 59

## 2023-06-09 VITALS — BP 124/68 | HR 82 | Ht 65.0 in | Wt 320.0 lb

## 2023-06-09 VITALS — BP 138/56 | HR 82 | Temp 98.1°F | Resp 20 | Ht 65.0 in | Wt 320.0 lb

## 2023-06-09 DIAGNOSIS — J4552 Severe persistent asthma with status asthmaticus: Secondary | ICD-10-CM | POA: Diagnosis not present

## 2023-06-09 DIAGNOSIS — J4489 Other specified chronic obstructive pulmonary disease: Secondary | ICD-10-CM

## 2023-06-09 MED ORDER — TRELEGY ELLIPTA 200-62.5-25 MCG/ACT IN AEPB: 1.0000 | INHALATION_SPRAY | Freq: Every day | RESPIRATORY_TRACT | 11 refills | Status: DC

## 2023-06-09 MED ORDER — TRELEGY ELLIPTA 200-62.5-25 MCG/ACT IN AEPB: 1.0000 | INHALATION_SPRAY | Freq: Every day | RESPIRATORY_TRACT | 11 refills | Status: AC

## 2023-06-09 MED ORDER — MONTELUKAST SODIUM 10 MG PO TABS: 10.0000 mg | ORAL_TABLET | Freq: Every day | ORAL | 11 refills | Status: AC

## 2023-06-09 MED ORDER — TEZEPELUMAB-EKKO 210 MG/1.91ML ~~LOC~~ SOSY
210.0000 mg | PREFILLED_SYRINGE | Freq: Once | SUBCUTANEOUS | Status: AC
  Administered 2023-06-09: 210 mg via SUBCUTANEOUS
  Filled 2023-06-09: qty 1.91

## 2023-06-09 NOTE — Patient Instructions (Signed)
 You are clear now from a Pulmonary standpoint, for planned bariatric surgery. Good luck with that.  Trelegy refilled.

## 2023-06-09 NOTE — Progress Notes (Signed)
Diagnosis: Asthma  Provider:  Chilton Greathouse MD  Procedure: Injection  Tezspire (Tezepelumab), Dose: 210 mg, Site: subcutaneous, Number of injections: 1  Injection Site(s): Right arm  Post Care:  right arm injection  Discharge: Condition: Good, Destination: Home . AVS Declined  Performed by:  Rico Ala, LPN

## 2023-06-10 ENCOUNTER — Ambulatory Visit (HOSPITAL_COMMUNITY): Payer: 59 | Admitting: Licensed Clinical Social Worker

## 2023-06-10 ENCOUNTER — Ambulatory Visit: Payer: 59

## 2023-06-10 NOTE — Telephone Encounter (Signed)
Ov note from 06/09/23 with Dr Maple Hudson faxed to [facility]

## 2023-06-11 ENCOUNTER — Ambulatory Visit: Payer: Self-pay | Admitting: Family Medicine

## 2023-06-11 NOTE — Telephone Encounter (Signed)
  Chief Complaint: Constipation Symptoms: hard bowel movement Frequency: started yesterday Pertinent Negatives: Patient denies abdominal pain, nausea Disposition: [] ED /[] Urgent Care (no appt availability in office) / [] Appointment(In office/virtual)/ []  [place] [facility]/ [x] Home Care/ [] Refused Recommended Disposition /[] [place] [facility]/ []  Follow-up with PCP Additional Notes: patient called with concerns for mild constipation. Patient reports no changes to medication but reports dietary changes. Patient states she has increased her protein intake with drinking Ensure in the morning. Patient educated on increasing water intake with increasing protein intake. Patient given information about OTC stool softeners.patient is requesting stool softener prescription be send to her pharmacy. Patient verbalized understanding of plan and all questions answered.    Reason for Triage: Pt is currently struggling with constipation and would like a stool softener or something called in.   Pt callback [phone]    Reason for Disposition  Over-The-Counter (OTC) medicines for constipation, questions about  Answer Assessment - Initial Assessment Questions 1. STOOL PATTERN OR FREQUENCY: "How often do you have a bowel movement (BM)?"  (Normal range: 3 times a day to every 3 days)  "When was your last BM?"       Was able to use bathroom today 2. STRAINING: "Do you have to strain to have a BM?"      Reports straining 3. RECTAL PAIN: "Does your rectum hurt when the stool comes out?" If Yes, ask: "Do you have hemorrhoids? How bad is the pain?"  (Scale 1-10; or mild, moderate, severe)     Yes 4. STOOL COMPOSITION: "Are the stools hard?"      yes 5. BLOOD ON STOOLS: "Has there been any blood on the toilet tissue or on the surface of the BM?" If Yes, ask: "When was the last time?"     No 6. CHRONIC CONSTIPATION: "Is this a new problem for you?"  If No, ask: "How long have you had this problem?" (days,  weeks, months)      Patient reports having it before 7. CHANGES IN DIET OR HYDRATION: "Have there been any recent changes in your diet?" "How much fluids are you drinking on a daily basis?"  "How much have you had to drink today?"     Changed diet-stopped soda and drinking ensure 8. MEDICINES: "Have you been taking any new medicines?" "Are you taking any narcotic pain medicines?" (e.g., Dilaudid, morphine, Percocet, Vicodin)     No 9. LAXATIVES: "Have you been using any stool softeners, laxatives, or enemas?"  If Yes, ask "What, how often, and when was the last time?"     No 10. ACTIVITY:  "How much walking do you do every day?"  "Has your activity level decreased in the past week?"        No 11. CAUSE: "What do you think is causing the constipation?"        Increased protein 12. OTHER SYMPTOMS: "Do you have any other symptoms?" (e.g., abdomen pain, bloating, fever, vomiting)       No 13. MEDICAL HISTORY: "Do you have a history of hemorrhoids, rectal fissures, or rectal surgery or rectal abscess?"         No  Protocols used: Constipation-A-AH

## 2023-06-12 MED ORDER — DOCUSATE SODIUM 100 MG PO CAPS: 100.0000 mg | ORAL_CAPSULE | Freq: Two times a day (BID) | ORAL | 3 refills | Status: DC

## 2023-06-12 NOTE — Addendum Note (Signed)
Addended by: Hoy Register on: 06/12/2023 12:41 PM   Modules accepted: Orders

## 2023-06-12 NOTE — Telephone Encounter (Signed)
patient is requesting stool softener prescription be send to her pharmacy.

## 2023-06-12 NOTE — Telephone Encounter (Signed)
Colace has been sent to her Pharmacy.

## 2023-06-12 NOTE — Telephone Encounter (Signed)
Patient has been called and informed of medication being sent.

## 2023-06-16 ENCOUNTER — Encounter: Payer: 59 | Attending: Surgery | Admitting: Dietician

## 2023-06-16 ENCOUNTER — Encounter: Payer: Self-pay | Admitting: Dietician

## 2023-06-16 VITALS — Ht 65.0 in | Wt 316.8 lb

## 2023-06-16 DIAGNOSIS — Z713 Dietary counseling and surveillance: Secondary | ICD-10-CM | POA: Diagnosis not present

## 2023-06-16 DIAGNOSIS — E669 Obesity, unspecified: Secondary | ICD-10-CM | POA: Diagnosis present

## 2023-06-16 NOTE — Progress Notes (Addendum)
Supervised Weight Loss Visit Bariatric Nutrition Education Appt Start Time: 1526    End Time: 1558  Planned surgery: RYGB Pt expectation of surgery: lose weight to be healthy  2 out of 6 SWL Appointments   NUTRITION ASSESSMENT   Anthropometrics  Start weight at [facility]: 323.9 lbs (date: 05/01/2023)  Height: 65 in Weight today: 316.8 lbs BMI:  kg/m2     Clinical   Pharmacotherapy: History of weight loss medication used: phentermine, Ozempic (not tolerated)  Medical hx: asthma, COPD, HTN Medications:  omeprazole, gabapentin, albuterol, clonazepam, epinephrine, famotidine, Flonase, Rybelsus, Ambien, vit D, calcium  Labs: Chol 214; Triglycerides 171; LDL 127; A1c 5.8, folate 2.4, Vit D 25.5 Notable signs/symptoms: walking slow Any previous deficiencies? No  Lifestyle & Dietary Hx  Pt states she has been a little constipated, stating she is trying to increase fluids. Pt states she cut out sodas. Pt states she has not done much physical activity, stating she had a fall.  Estimated daily fluid intake: not tracking Supplements: vit D, calcium Current average weekly physical activity: ADLs  24-Hr Dietary Recall First Meal: Ensure  Snack: soda Second Meal: skip (if out and about, fast food) or ensure Snack: soda Third Meal: chicken or pork chop, greens, rice or mashed potatoes Snack: popcorn Beverages: water, soda (ginger ale, Pepsi)  Estimated Energy Needs Calories: 1500  NUTRITION DIAGNOSIS  Overweight/obesity ([place]-3.3) related to past poor dietary habits and physical inactivity as evidenced by patient w/ planned RYGB surgery following dietary guidelines for continued weight loss.  NUTRITION INTERVENTION  Nutrition counseling (C-1) and education (E-2) to facilitate bariatric surgery goals.  Encouraged pt to continue to eat balanced meals inclusive of non starchy vegetables 2 times a day 7 days a week Non-starchy vegetables are high in fiber and contain essential vitamins  and minerals. It's best to consume a variety of non-starchy and starchy vegetables throughout the day. They will add color, nutrients and flavor to your meals for very few calories. Encouraged pt to choose lean protein sources: limiting beef, pork, sausage, hotdogs, and lunch meat Encourage pt to choose healthy fats such as plant based limiting animal fats Encouraged pt to continue to drink a minium 64 fluid ounces with half being plain water to satisfy proper hydration  Discussed criteria for protein shakes, a review from the first visit.  Pre-Op Goals Progress & New Goals Re-engage: Start taking folate and Vit D per PCP Re-engage: Switch to non-caloric, non-carbonated and non-caffeinated beverages such as  water, unsweetened tea, Crystal Light and zero calorie beverages (aim for 64 oz. per day) Continue: increase physical activity. Engage in gym activity  20 minutes 1 time per week Continue:  Eat every 3-5 hours during the day; have a protein with every meal and snack  New: increase non-starchy vegetables; aim for 2 or more servings per day.  Handouts Provided Include  Meal Ideas Handout Protein Shake Criteria Handout  Learning Style & Readiness for Change Teaching method utilized: Visual & Auditory  Demonstrated degree of understanding via: Teach Back  Readiness Level: preparation Barriers to learning/adherence to lifestyle change: mobility  RD's Notes for next Visit  Patient progress toward chosen goals  MONITORING & EVALUATION Dietary intake, weekly physical activity, body weight, and pre-op goals in 1 month.   Next Steps  Patient is to return to [facility] in 1 month for next SWL visit.

## 2023-06-20 ENCOUNTER — Ambulatory Visit: Payer: 59 | Attending: Internal Medicine | Admitting: Internal Medicine

## 2023-06-20 ENCOUNTER — Encounter: Payer: Self-pay | Admitting: Internal Medicine

## 2023-06-20 VITALS — BP 124/72 | HR 77 | Ht 65.0 in | Wt 312.8 lb

## 2023-06-20 DIAGNOSIS — Z01818 Encounter for other preprocedural examination: Secondary | ICD-10-CM

## 2023-06-20 NOTE — Progress Notes (Signed)
Cardiology Office Note:  .   Date:  06/20/2023  ID:  April [name], DOB Mar 25, 1971, MRN [id number] PCP: Hoy Register, MD  [facility] Providers Cardiologist:  Thurmon Fair, MD    History of Present Illness: April [name]   April [name] is a 53 y.o. female with hx of recurrent hypoxic respiratory failure of PAF, HTN, HFpEF, obesity who is being referred for preop. I have not seen this patient. She was seen by Dr. Anne Fu in 2023 for acute hypoxic respiratory failure in the setting of asthma exacerbation/HfPEF. She was diuresed and managed with nebs/steroids. She also had PAF and managed with eliquis and cardizem.She also has HTN. She has had frequent hospital admissions. No cardiology FU. She was also in the hospital for a mechanical fall and some SOB.   She is being referred to cardiology from her PCP because she is planning for bariatric surgery.  Today she is doing well. She can walk up a flight of stairs. No chest pressure. She is asymptomatic. Unfortunately, she recently lost a child.    ROS:  per HPI otherwise negative   Studies Reviewed: April [name]   EKG Interpretation Date/Time:  Friday June 20 2023 14:53:24 EST Ventricular Rate:  78 PR Interval:  154 QRS Duration:  92 QT Interval:  390 QTC Calculation: 444 R Axis:   100  Text Interpretation: Normal sinus rhythm Rightward axis Poor R wave progression When compared with ECG of 29-May-2023 01:54, No significant change since last tracing Confirmed by Carolan Clines [phone]) on 06/20/2023 2:59:12 PM    EKG Interpretation Date/Time:  Friday June 20 2023 14:53:24 EST Ventricular Rate:  78 PR Interval:  154 QRS Duration:  92 QT Interval:  390 QTC Calculation: 444 R Axis:   100  Text Interpretation: Normal sinus rhythm Rightward axis Poor R wave progression When compared with ECG of 29-May-2023 01:54, No significant change since last tracing Confirmed by Carolan Clines ([id number]) on 06/20/2023 2:59:12 PM  Risk Assessment/Calculations:     CHA2DS2-VASc Score = 3   This indicates a 3.2% annual risk of stroke. The patient's score is based upon: CHF History: 1 HTN History: 1 Diabetes History: 0 Stroke History: 0 Vascular Disease History: 0 Age Score: 0 Gender Score: 1         Physical Exam:   VS:   Vitals:   06/20/23 1448  BP: 124/72  Pulse: 77  SpO2: 98%    Wt Readings from Last 3 Encounters:  06/20/23 (!) 312 lb 12.8 oz (141.9 kg)  06/16/23 (!) 316 lb 12.8 oz (143.7 kg)  06/09/23 (!) 320 lb (145.2 kg)    GEN: Well nourished, well developed in no acute distress NECK: No JVD; No carotid bruits CARDIAC: RRR, no murmurs, rubs, gallops RESPIRATORY:  Clear to auscultation without rales, wheezing or rhonchi  ABDOMEN: Soft, non-tender, non-distended EXTREMITIES:  No edema; No deformity   ASSESSMENT AND PLAN: .   Preop This is my first time meeting her but,  she can do > 4 METS without shortness of breath or chest pressure. Her EF is normal and she has no pulmonary HTN. She is in sinus rhythm. Her RCRI is a score of 1, 6% of a major cardiac event. She is acceptable cardiac risk for bariatric surgery.  HTN Well-controlled -continue norvasc 10 mg daily and BB  PAF in 2023 -In sinus rhythm -continue eliquis 5 mg BID -continue coreg 6.25  mg BID  HLD -continue lipitor 20 mg daily  Obesity -On semaglutide, preparing  for gastric bypass     Dispo: Follow up in 6 months with an APP  Signed, Wilman Tucker, Alben Spittle, MD

## 2023-06-20 NOTE — Patient Instructions (Signed)
Medication Instructions:  Your physician recommends that you continue on your current medications as directed. Please refer to the Current Medication list given to you today.    *If you need a refill on your cardiac medications before your next appointment, please call your pharmacy*   Lab Work: None    If you have labs (blood work) drawn today and your tests are completely normal, you will receive your results only by: MyChart Message (if you have MyChart) OR A paper copy in the mail If you have any lab test that is abnormal or we need to change your treatment, we will call you to review the results.   Testing/Procedures: None    Follow-Up: At [facility], you and your health needs are our priority.  As part of our continuing mission to provide you with exceptional heart care, we have created designated Provider Care Teams.  These Care Teams include your primary Cardiologist (physician) and Advanced Practice Providers (APPs -  Physician Assistants and Nurse Practitioners) who all work together to provide you with the care you need, when you need it.  We recommend signing up for the patient portal called "MyChart".  Sign up information is provided on this After Visit Summary.  MyChart is used to connect with patients for Virtual Visits (Telemedicine).  Patients are able to view lab/test results, encounter notes, upcoming appointments, etc.  Non-urgent messages can be sent to your provider as well.   To learn more about what you can do with MyChart, go to [email or web address].    Your next appointment:   6 month(s)  The format for your next appointment:   In Person  Provider:   Edd Fabian, FNP, Micah Flesher, PA-C, Marjie Skiff, PA-C, Robet Leu, PA-C, Juanda Crumble, PA-C, Joni Reining, DNP, ANP, Azalee Course, PA-C, Bernadene Person, NP, or Reather Littler, NP      Other Instructions

## 2023-06-26 ENCOUNTER — Other Ambulatory Visit: Payer: Self-pay

## 2023-06-26 NOTE — Progress Notes (Signed)
 Specialty Pharmacy Refill Coordination Note  April [name] is a 53 y.o. female contacted today regarding refills of specialty medication(s) Daiva Huge Dorothea Ogle)   Patient requested Courier to Provider Office   Delivery date: 07/03/23   Verified address: [facility] [employer] [address]   Medication will be filled on 07/02/23.

## 2023-06-27 ENCOUNTER — Telehealth: Payer: Self-pay

## 2023-06-27 NOTE — Telephone Encounter (Signed)
VOB submitted for Durolane, bilateral knee  

## 2023-07-02 ENCOUNTER — Other Ambulatory Visit: Payer: Self-pay

## 2023-07-07 ENCOUNTER — Ambulatory Visit: Payer: 59

## 2023-07-07 ENCOUNTER — Telehealth: Payer: Self-pay

## 2023-07-07 VITALS — BP 139/92 | HR 72 | Temp 98.6°F | Resp 16 | Ht 65.0 in | Wt 316.0 lb

## 2023-07-07 DIAGNOSIS — J4489 Other specified chronic obstructive pulmonary disease: Secondary | ICD-10-CM

## 2023-07-07 DIAGNOSIS — J4552 Severe persistent asthma with status asthmaticus: Secondary | ICD-10-CM

## 2023-07-07 MED ORDER — TEZEPELUMAB-EKKO 210 MG/1.91ML ~~LOC~~ SOSY
210.0000 mg | PREFILLED_SYRINGE | Freq: Once | SUBCUTANEOUS | Status: AC
  Administered 2023-07-07: 210 mg via SUBCUTANEOUS
  Filled 2023-07-07: qty 1.91

## 2023-07-07 MED ORDER — VITAMIN D (ERGOCALCIFEROL) 1.25 MG (50000 UNIT) PO CAPS: 50000.0000 [IU] | ORAL_CAPSULE | ORAL | 1 refills | Status: AC

## 2023-07-07 MED ORDER — POTASSIUM CHLORIDE ER 20 MEQ PO TBCR: 1.0000 | EXTENDED_RELEASE_TABLET | Freq: Every day | ORAL | 1 refills | Status: DC

## 2023-07-07 NOTE — Telephone Encounter (Signed)
 Patient came in requesting a prescription for on Vitamin D (DRISDOL) and Potassium Chloride ER 20, advised patient that she may need an appointment. Patient is requesting a call back.

## 2023-07-07 NOTE — Telephone Encounter (Signed)
 Routing to PCP for review.

## 2023-07-07 NOTE — Telephone Encounter (Signed)
Patient was called and informed of medication being refilled . 

## 2023-07-07 NOTE — Telephone Encounter (Signed)
 Refill sent to pharmacy.

## 2023-07-07 NOTE — Progress Notes (Unsigned)
   Procedure Note  Patient: April [name]             Date of Birth: 22-Feb-1971           MRN: [id number]             Visit Date: 07/08/2023  Procedures: Visit Diagnoses:  1. Primary osteoarthritis of right knee     Large Joint Inj: bilateral knee on 07/08/2023 3:36 PM Indications: pain Details: 22 G needle  Arthrogram: No  Medications (Right): 60 mg Sodium Hyaluronate 60 MG/3ML Medications (Left): 60 mg Sodium Hyaluronate 60 MG/3ML Outcome: tolerated well, no immediate complications Patient was prepped and draped in the usual sterile fashion.     Bilateral durolane injection performed today.

## 2023-07-07 NOTE — Progress Notes (Signed)
 Diagnosis: Asthma  Provider:  Chilton Greathouse MD  Procedure: Injection  Tezspire (Tezepelumab), Dose: 210 mg, Site: subcutaneous, Number of injections: 1  Injection Site(s): Left arm  Post Care:  left arm injection  Discharge: Condition: Good, Destination: Home . AVS Declined  Performed by:  Rico Ala, LPN

## 2023-07-07 NOTE — Addendum Note (Signed)
 Addended by: Hoy Register on: 07/07/2023 04:47 PM   Modules accepted: Orders

## 2023-07-08 ENCOUNTER — Other Ambulatory Visit: Payer: Self-pay

## 2023-07-08 ENCOUNTER — Other Ambulatory Visit: Payer: Self-pay | Admitting: Family Medicine

## 2023-07-08 ENCOUNTER — Encounter: Payer: Self-pay | Admitting: Internal Medicine

## 2023-07-08 ENCOUNTER — Ambulatory Visit (INDEPENDENT_AMBULATORY_CARE_PROVIDER_SITE_OTHER): Payer: 59 | Admitting: Orthopaedic Surgery

## 2023-07-08 DIAGNOSIS — M170 Bilateral primary osteoarthritis of knee: Secondary | ICD-10-CM | POA: Diagnosis not present

## 2023-07-08 DIAGNOSIS — M1711 Unilateral primary osteoarthritis, right knee: Secondary | ICD-10-CM

## 2023-07-08 DIAGNOSIS — M1712 Unilateral primary osteoarthritis, left knee: Secondary | ICD-10-CM

## 2023-07-08 MED ORDER — SODIUM HYALURONATE 60 MG/3ML IX PRSY
60.0000 mg | PREFILLED_SYRINGE | INTRA_ARTICULAR | Status: AC | PRN
  Administered 2023-07-08: 60 mg via INTRA_ARTICULAR

## 2023-07-09 NOTE — Telephone Encounter (Signed)
 06/06/23: LM for patient to bring in SD card if using CPAP.

## 2023-07-14 ENCOUNTER — Encounter: Payer: 59 | Attending: Surgery | Admitting: Dietician

## 2023-07-14 ENCOUNTER — Encounter: Payer: Self-pay | Admitting: Dietician

## 2023-07-14 VITALS — Ht 65.0 in | Wt 325.1 lb

## 2023-07-14 DIAGNOSIS — Z6841 Body Mass Index (BMI) 40.0 and over, adult: Secondary | ICD-10-CM | POA: Insufficient documentation

## 2023-07-14 DIAGNOSIS — E669 Obesity, unspecified: Secondary | ICD-10-CM | POA: Diagnosis present

## 2023-07-14 DIAGNOSIS — Z713 Dietary counseling and surveillance: Secondary | ICD-10-CM | POA: Diagnosis not present

## 2023-07-14 NOTE — Progress Notes (Signed)
 Supervised Weight Loss Visit Bariatric Nutrition Education Appt Start Time: 1539    End Time: 1610  Planned surgery: RYGB Pt expectation of surgery: lose weight to be healthy  3 out of 6 SWL Appointments   NUTRITION ASSESSMENT   Anthropometrics  Start weight at [facility]: 323.9 lbs (date: 05/01/2023)  Height: 65 in Weight today: 325.1 lbs BMI: 54.10 kg/m2     Clinical   Pharmacotherapy: History of weight loss medication used: phentermine, Ozempic (not tolerated)  Medical hx: asthma, COPD, HTN Medications:  omeprazole, gabapentin, albuterol, clonazepam, epinephrine, famotidine, Flonase, Rybelsus, Ambien, vit D, calcium  Labs: Chol 214; Triglycerides 171; LDL 127; A1c 5.8, folate 2.4, Vit D 25.5 Notable signs/symptoms: walking slow Any previous deficiencies? No  Lifestyle & Dietary Hx  Pt states she is menstruating, stating she craves more food during that time. Pt states she is doing well with fluid volume and states she stopped drinking soda. Pt states she likes the zero sugar flavoring packs, stating it helps her get more fluid in. Pt had questions about the multivitamin and calcium for after surgery. Dietitian reviewed the bariatric multivitamin and calcium handout from the initial visit.  Estimated daily fluid intake: 64+ oz per day Supplements: vit D, calcium Current average weekly physical activity: ADLs  24-Hr Dietary Recall First Meal: Ensure Max protein shake Snack:  Second Meal: skip (if out and about, fast food) or ensure Snack: soda Third Meal: chicken or pork chop, greens, rice or mashed potatoes Snack: popcorn Beverages: water with flavorings  Estimated Energy Needs Calories: 1500  NUTRITION DIAGNOSIS  Overweight/obesity ([place]-3.3) related to past poor dietary habits and physical inactivity as evidenced by patient w/ planned RYGB surgery following dietary guidelines for continued weight loss.  NUTRITION INTERVENTION  Nutrition counseling (C-1) and education  (E-2) to facilitate bariatric surgery goals.  Encouraged pt to continue to eat balanced meals inclusive of non starchy vegetables 2 times a day 7 days a week Non-starchy vegetables are high in fiber and contain essential vitamins and minerals. It's best to consume a variety of non-starchy and starchy vegetables throughout the day. They will add color, nutrients and flavor to your meals for very few calories. Encouraged pt to choose lean protein sources: limiting beef, pork, sausage, hotdogs, and lunch meat Encourage pt to choose healthy fats such as plant based limiting animal fats Encouraged pt to continue to drink a minium 64 fluid ounces with half being plain water to satisfy proper hydration  Discussed criteria for protein shakes, a review from the first visit. Discussed bariatric vitamins needed after surgery. Reviewed the handout from initial visit. Discussed Meal Planning and Prepping: Planning means you eat more regularly. Meal planning and prepping can also help you to keep to a more regular meal schedule. Regular small meals are particularly important when you don't feel hungry as often.  Pre-Op Goals Progress & New Goals Continue: Start taking folate (ask PCP or surgeon if you should start taking) and Vit D Continue: Switch to non-caloric, non-carbonated and non-caffeinated beverages such as  water, unsweetened tea, Crystal Light and zero calorie beverages (aim for 64 oz. per day) Continue: increase physical activity. Engage in gym activity  20 minutes 1 time per week Continue:  Eat every 3-5 hours during the day; have a protein with every meal and snack  Continue: increase non-starchy vegetables; aim for 2 or more servings per day. New: research bariatric vitamins to identify one you might start taking after surgery. New: meal plan and prep food for  the next day; increase protein from food and decrease protein from protein shakes.  Handouts Provided Include  Bariatric Multivitamin  Handout  Learning Style & Readiness for Change Teaching method utilized: Visual & Auditory  Demonstrated degree of understanding via: Teach Back  Readiness Level: preparation Barriers to learning/adherence to lifestyle change: mobility  RD's Notes for next Visit  Patient progress toward chosen goals  MONITORING & EVALUATION Dietary intake, weekly physical activity, body weight, and pre-op goals in 1 month.   Next Steps  Patient is to return to [facility] in 1 month for next SW[facility] visit.

## 2023-07-16 ENCOUNTER — Other Ambulatory Visit: Payer: Self-pay | Admitting: Family Medicine

## 2023-07-16 DIAGNOSIS — N39498 Other specified urinary incontinence: Secondary | ICD-10-CM

## 2023-07-17 ENCOUNTER — Other Ambulatory Visit (HOSPITAL_COMMUNITY): Payer: Self-pay | Admitting: Surgery

## 2023-07-17 ENCOUNTER — Ambulatory Visit (HOSPITAL_COMMUNITY)
Admission: RE | Admit: 2023-07-17 | Discharge: 2023-07-17 | Disposition: A | Discharge status: Home / Self Care | Source: Ambulatory Visit | Attending: Surgery | Admitting: Surgery

## 2023-07-28 ENCOUNTER — Other Ambulatory Visit: Payer: Self-pay

## 2023-07-29 ENCOUNTER — Other Ambulatory Visit: Payer: Self-pay

## 2023-07-29 ENCOUNTER — Ambulatory Visit (INDEPENDENT_AMBULATORY_CARE_PROVIDER_SITE_OTHER)

## 2023-07-29 ENCOUNTER — Other Ambulatory Visit: Payer: Self-pay | Admitting: Internal Medicine

## 2023-07-29 VITALS — BP 143/86 | HR 74 | Temp 98.5°F | Resp 20 | Ht 65.0 in | Wt 317.0 lb

## 2023-07-29 DIAGNOSIS — J4489 Other specified chronic obstructive pulmonary disease: Secondary | ICD-10-CM | POA: Diagnosis not present

## 2023-07-29 DIAGNOSIS — J4552 Severe persistent asthma with status asthmaticus: Secondary | ICD-10-CM

## 2023-07-29 DIAGNOSIS — J4541 Moderate persistent asthma with (acute) exacerbation: Secondary | ICD-10-CM

## 2023-07-29 MED ORDER — TEZSPIRE 210 MG/1.91ML ~~LOC~~ SOAJ
210.0000 mg | SUBCUTANEOUS | 2 refills | Status: DC
  Filled 2023-07-29: qty 1.91, 28d supply, fill #0
  Filled 2023-08-21: qty 1.91, 28d supply, fill #1
  Filled 2023-09-12: qty 1.91, 28d supply, fill #2

## 2023-07-29 MED ORDER — TEZEPELUMAB-EKKO 210 MG/1.91ML ~~LOC~~ SOSY
210.0000 mg | PREFILLED_SYRINGE | Freq: Once | SUBCUTANEOUS | Status: AC
  Administered 2023-07-29: 210 mg via SUBCUTANEOUS
  Filled 2023-07-29: qty 1.91

## 2023-07-29 NOTE — Progress Notes (Signed)
 Specialty Pharmacy Refill Coordination Note  April [name] is a 53 y.o. female contacted today regarding refills of specialty medication(s) Daiva Huge Dorothea Ogle)   Patient requested Courier to Provider Office   Delivery date: 07/29/23   Verified address: [employer] [address]   Medication will be filled on 07/30/23.

## 2023-07-29 NOTE — Progress Notes (Signed)
 Diagnosis: Asthma  Provider:  Chilton Greathouse MD  Procedure: Injection  Tezspire (Tezepelumab), Dose: 210 mg, Site: subcutaneous, Number of injections: 1  Injection Site(s): Right arm  Post Care: Patient declined observation  Discharge: Condition: Good, Destination: Home . AVS Declined  Performed by:  Loney Hering, LPN

## 2023-07-30 ENCOUNTER — Other Ambulatory Visit: Payer: Self-pay | Admitting: Internal Medicine

## 2023-07-30 ENCOUNTER — Other Ambulatory Visit: Payer: Self-pay

## 2023-07-30 NOTE — Telephone Encounter (Signed)
Zolpidem refilled.

## 2023-07-30 NOTE — Telephone Encounter (Signed)
 Copied from CRM [id number]. Topic: Clinical - Medication Refill >> Jul 30, 2023 12:16 PM Renie Ora wrote: Most Recent Primary Care Visit:  Provider: Hoy Register  Department: [facility]  Visit Type: OFFICE VISIT  Date: 05/20/2023  Medication: zolpidem (AMBIEN) 10 MG tablet  Has the patient contacted their pharmacy? Yes (Agent: If no, request that the patient contact the pharmacy for the refill. If patient does not wish to contact the pharmacy document the reason why and proceed with request.) (Agent: If yes, when and what did the pharmacy advise?)  Is this the correct pharmacy for this prescription? Yes If no, delete pharmacy and type the correct one.  This is the patient's preferred pharmacy:  [id number] - [place], [place] - [address] AT [facility] [address] [place] [address] Phone: [phone] Fax: [phone]   Has the prescription been filled recently? Yes  Is the patient out of the medication? Yes  Has the patient been seen for an appointment in the last year OR does the patient have an upcoming appointment? Yes  Can we respond through MyChart? Yes  Agent: Please be advised that Rx refills may take up to 3 business days. We ask that you follow-up with your pharmacy.

## 2023-07-30 NOTE — Telephone Encounter (Signed)
**Note De-identified  [place] Obfuscation** Please advise 

## 2023-07-31 ENCOUNTER — Ambulatory Visit (HOSPITAL_COMMUNITY): Admitting: Licensed Clinical Social Worker

## 2023-07-31 DIAGNOSIS — F411 Generalized anxiety disorder: Secondary | ICD-10-CM
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
## 2023-08-01 ENCOUNTER — Other Ambulatory Visit: Payer: Self-pay | Admitting: Family Medicine

## 2023-08-01 DIAGNOSIS — I480 Paroxysmal atrial fibrillation: Secondary | ICD-10-CM

## 2023-08-14 ENCOUNTER — Encounter: Payer: Self-pay | Admitting: Dietician

## 2023-08-14 ENCOUNTER — Encounter: Attending: Surgery | Admitting: Dietician

## 2023-08-14 VITALS — Wt 309.4 lb

## 2023-08-14 DIAGNOSIS — Z713 Dietary counseling and surveillance: Secondary | ICD-10-CM | POA: Diagnosis not present

## 2023-08-14 DIAGNOSIS — Z6841 Body Mass Index (BMI) 40.0 and over, adult: Secondary | ICD-10-CM | POA: Diagnosis not present

## 2023-08-14 DIAGNOSIS — E669 Obesity, unspecified: Secondary | ICD-10-CM | POA: Diagnosis present

## 2023-08-14 NOTE — Progress Notes (Signed)
 Supervised Weight Loss Visit Bariatric Nutrition Education Appt Start Time: 1536    End Time: 1610  Planned surgery: RYGB Pt expectation of surgery: lose weight to be healthy  4 out of 6 SWL Appointments   NUTRITION ASSESSMENT   Anthropometrics  Start weight at [facility]: 323.9 lbs (date: 05/01/2023)  Height: 65 in Weight today: 309.4 lbs BMI: 51.49 kg/m2     Clinical   Pharmacotherapy: History of weight loss medication used: phentermine, Ozempic (not tolerated)  Medical hx: asthma, COPD, HTN Medications:  omeprazole, gabapentin, albuterol, clonazepam, epinephrine, famotidine, Flonase, Rybelsus, Ambien, vit D, calcium  Labs: Chol 214; Triglycerides 171; LDL 127; A1c 5.8, folate 2.4, Vit D 25.5 Notable signs/symptoms: walking slow Any previous deficiencies? No  Lifestyle & Dietary Hx  Pt states her asthma has been acting up, stating she is trying to avoid going to the hospital. Pt states she has a small cold. Pt states she is having a hard time eatings stating she is not hungry. Pt states she wants to join a gym, stating she has been walking on a treadmill. Pt states she has not researched the multivitamins.  Estimated daily fluid intake: 64+ oz per day Supplements: vit D, calcium Current average weekly physical activity: ADLs; treadmill every other day, 10-15 minutes  24-Hr Dietary Recall First Meal: Ensure Max protein shake or skip or egg and oatmeal and banana Snack:  Second Meal: skip (if out and about, fast food) or ensure Snack: soda Third Meal: chicken or pork chop, greens or spinach, rice or mashed potatoes Snack: popcorn Beverages: water with flavorings  Estimated Energy Needs Calories: 1500  NUTRITION DIAGNOSIS  Overweight/obesity (Weed-3.3) related to past poor dietary habits and physical inactivity as evidenced by patient w/ planned RYGB surgery following dietary guidelines for continued weight loss.  NUTRITION INTERVENTION  Nutrition counseling (C-1) and  education (E-2) to facilitate bariatric surgery goals.  Encouraged pt to continue to eat balanced meals inclusive of non starchy vegetables 2 times a day 7 days a week Non-starchy vegetables are high in fiber and contain essential vitamins and minerals. It's best to consume a variety of non-starchy and starchy vegetables throughout the day. They will add color, nutrients and flavor to your meals for very few calories. Encouraged pt to continue to drink a minium 64 fluid ounces with half being plain water to satisfy proper hydration  Revisited bariatric vitamins needed after surgery. Reviewed the handout from initial visit. Discussed Meal Planning and Prepping: Planning means you eat more regularly. Meal planning and prepping can also help you to keep to a more regular meal schedule. Regular small meals are particularly important when you don't feel hungry as often. Encouraged patient to honor their body's internal hunger and fullness cues.  Throughout the day, check in mentally and rate hunger. Stop eating when satisfied not full regardless of how much food is left on the plate.  Get more if still hungry 20-30 minutes later.  The key is to honor satisfaction so throughout the meal, rate fullness factor and stop when comfortably satisfied not physically full. The key is to honor hunger and fullness without any feelings of guilt or shame.  Pay attention to what the internal cues are, rather than any external factors. This will enhance the confidence you have in listening to your own body and following those internal cues enabling you to increase how often you eat when you are hungry not out of appetite and stop when you are satisfied not full.   Pre-Op  Goals Progress & New Goals Continue: taking folate (ask PCP or surgeon if you should start taking) and Vit D Continue: Switch to non-caloric, non-carbonated and non-caffeinated beverages such as  water, unsweetened tea, Crystal Light and zero calorie  beverages (aim for 64 oz. per day) Continue: increase physical activity. Engage in gym activity  20 minutes 1 time per week Continue:  Eat every 3-5 hours during the day; have a protein with every meal and snack  Continue: increase non-starchy vegetables; aim for 2 or more servings per day. Continue: research bariatric vitamins to identify one you might start taking after surgery. Re-engage: meal plan and prep food for the next day; increase protein from food and decrease protein from protein shakes; aim for 60 grams per day; have protein with every meal or snack; use the plate method for portion sizes.  Handouts Provided Include  Bariatric MyPlate Bariatric Multivitamin Handout  Learning Style & Readiness for Change Teaching method utilized: Visual & Auditory  Demonstrated degree of understanding via: Teach Back  Readiness Level: preparation Barriers to learning/adherence to lifestyle change: mobility  RD's Notes for next Visit  Patient progress toward chosen goals  MONITORING & EVALUATION Dietary intake, weekly physical activity, body weight, and pre-op goals in 1 month.   Next Steps  Patient is to return to [facility] in 1 month for next SWL visit.

## 2023-08-21 ENCOUNTER — Other Ambulatory Visit: Payer: Self-pay

## 2023-08-21 NOTE — Progress Notes (Signed)
 Specialty Pharmacy Refill Coordination Note  April [name] is a 53 y.o. female contacted today regarding refills of specialty medication(s) Tezepelumab -ekko (Tezspire )   Patient requested Courier to Provider Office   Delivery date: 08/25/23   Verified address: [facility] [address]   Medication will be filled on 08/22/23.

## 2023-08-22 ENCOUNTER — Other Ambulatory Visit: Payer: Self-pay

## 2023-08-23 ENCOUNTER — Other Ambulatory Visit: Payer: Self-pay | Admitting: Family Medicine

## 2023-08-25 ENCOUNTER — Ambulatory Visit: Payer: 59 | Admitting: Family Medicine

## 2023-08-25 NOTE — Telephone Encounter (Signed)
 Requested medication (s) are due for refill today - unsure  Requested medication (s) are on the active medication list -yes  Future visit scheduled -yes  Last refill: 04/18/23  Notes to clinic: historical medication- sent for review of request  Requested Prescriptions  Pending Prescriptions Disp Refills   cloNIDine  (CATAPRES ) 0.2 MG tablet [Pharmacy Med Name: CLONIDINE  0.2MG  TABLETS] 180 tablet     Sig: TAKE 1 TABLET BY MOUTH TWICE DAILY     Cardiovascular:  Alpha-2 Agonists Failed - 08/25/2023 10:26 AM      Failed - Last BP in normal range    BP Readings from Last 1 Encounters:  07/29/23 (!) 143/86         Passed - Last Heart Rate in normal range    Pulse Readings from Last 1 Encounters:  07/29/23 74         Passed - Valid encounter within last 6 months    Recent Outpatient Visits           3 months ago Morbid obesity due to excess calories (HCC)   [facility] Dept Of [place]. [facility] Joaquin Mulberry, MD   6 months ago Tremor   [facility] A Dept Of [place]. [facility] Joaquin Mulberry, MD   8 months ago Type 2 diabetes mellitus with other specified complication, without long-term current use of insulin  [facility])   [facility] - A Dept Of [place]. [facility] Joaquin Mulberry, MD   9 months ago Encounter for medication review and counseling   [facility] - A Dept Of [place]. [facility] Freada Jacobs, Adrian L, RPH-CPP   9 months ago Type 2 diabetes mellitus with other specified complication, without long-term current use of insulin  [facility])   [facility] - A Dept Of [place]. [facility] Joaquin Mulberry, MD       Future Appointments             In 2 months Joaquin Mulberry, MD [facility] - A Dept Of [place]. [facility]               Requested Prescriptions  Pending  Prescriptions Disp Refills   cloNIDine  (CATAPRES ) 0.2 MG tablet [Pharmacy Med Name: CLONIDINE  0.2MG  TABLETS] 180 tablet     Sig: TAKE 1 TABLET BY MOUTH TWICE DAILY     Cardiovascular:  Alpha-2 Agonists Failed - 08/25/2023 10:26 AM      Failed - Last BP in normal range    BP Readings from Last 1 Encounters:  07/29/23 (!) 143/86         Passed - Last Heart Rate in normal range    Pulse Readings from Last 1 Encounters:  07/29/23 74         Passed - Valid encounter within last 6 months    Recent Outpatient Visits           3 months ago Morbid obesity due to excess calories (HCC)   [facility] - A Dept Of [place]. [facility] Joaquin Mulberry, MD   6 months ago Tremor   [facility] - A Dept Of [place]. [facility] Joaquin Mulberry, MD   8 months ago Type 2 diabetes mellitus with other specified complication, without long-term current use of insulin  (HCC)  [facility] - A Dept Of [place]. [facility] Joaquin Mulberry, MD   9 months ago Encounter for medication review and counseling   [facility] [facility] - A Dept Of [place]. [facility] Freada Jacobs, [place] L, RPH-CPP   9 months ago Type 2 diabetes mellitus with other specified complication, without long-term current use of insulin  [facility])   [place] [facility] - A Dept Of [place]. [facility] Joaquin Mulberry, MD       Future Appointments             In 2 months Joaquin Mulberry, MD [facility] - A Dept Of . [facility]

## 2023-08-26 ENCOUNTER — Ambulatory Visit (INDEPENDENT_AMBULATORY_CARE_PROVIDER_SITE_OTHER)

## 2023-08-26 VITALS — BP 129/82 | HR 85 | Temp 98.3°F | Resp 18 | Ht 65.0 in | Wt 305.4 lb

## 2023-08-26 DIAGNOSIS — J4552 Severe persistent asthma with status asthmaticus: Secondary | ICD-10-CM

## 2023-08-26 DIAGNOSIS — J4489 Other specified chronic obstructive pulmonary disease: Secondary | ICD-10-CM | POA: Diagnosis not present

## 2023-08-26 MED ORDER — TEZEPELUMAB-EKKO 210 MG/1.91ML ~~LOC~~ SOSY
210.0000 mg | PREFILLED_SYRINGE | Freq: Once | SUBCUTANEOUS | Status: AC
  Administered 2023-08-26: 210 mg via SUBCUTANEOUS
  Filled 2023-08-26: qty 1.91

## 2023-08-26 NOTE — Progress Notes (Signed)
 Diagnosis: Asthma  Provider:  Mannam, Praveen MD  Procedure: Injection  Tezspire  (Tezepelumab ), Dose: 210 mg, Site: subcutaneous, Number of injections: 1  Injection Site(s): Left arm  Post Care: Patient declined observation  Discharge: Condition: Good, Destination: Home . AVS Declined  Performed by:  Natividad Balding, RN

## 2023-09-01 ENCOUNTER — Other Ambulatory Visit: Payer: Self-pay | Admitting: Internal Medicine

## 2023-09-07 NOTE — Progress Notes (Addendum)
 Patient ID: April [name], female    DOB: 10/11/1970, 53 y.o.   MRN: [id number]   HPI F former smoker followed for chronic severe asthma/ Xolair  complicated by non-compliance,  OSA/ CPAP,  morbid obesity, HBP, depression, GERD, AFib/ Eliquis   NPSG 03/31/07- AHI 5.8/ hr, weight 330 lbs. She canceled planned more recent study. Office spirometry 10/24/14- severe obstructive airways disease. FVC 2.10/66%, FEV1 1.25/47%, FEV1/FVC 0.59, FEF 25-75 percent 0.64/19% Unattended HST 07/06/16- AHI 11/hour, desaturation to 74%. Office Spirometry 07/29/2016-moderately severe obstructive airways disease. FVC 2.21/68%, FEV1 1.53/57%, ratio 0.69, FEF 25-75% 0.98/34% Prolonged hosp [facility] 4/19- tracheostomy. Dupixent  enrolled 10/22/2018> Tezspire  2023 HST 12/26/2018- AHI 9.7/ hr, desaturation to 9.7/ hr, desaturation to 69% NPSG 05/19/22- AHI 0/ hr, desat to 95% Primary Snoring, body weight 315 lbs -------------------------------------------------------   06/09/23- 53 year old female Smoker followed for chronic severe Asthma/ COPD/Xolair , Chronic Hypoxic Respiratory Failure, Tracheostomy at [facility] 2019, Tobacco Use, Insomnia,  complicated by noncompliance, OSA/ oral appliance, morbid Obesity, HTN, depression, GERD Covid infection, PAFib/ Eliquis , dCHF, Dyslipidemia, Depression,  Dupixent  enrolled 10/22/2018 changed to Tezspire  2023. O2 2L prn -Proair  hfa, Trelegy  200, Singulair , Neb Brovana , Tezspire , Ambien  10,  Body weight today-  Last hosp asthma d/c 1/3-/25 CXR 05/29/23- FINDINGS: The heart size and mediastinal contours are within normal limits. Both lungs are clear. The visualized skeletal structures are unremarkable. IMPRESSION: No active disease. Discussed the use of AI scribe software for clinical note transcription with the patient, who gave verbal consent to proceed.  History of Present Illness   The patient, with a history of asthma and anxiety, presents for clearance for bariatric surgery. She  reports a recent fall at home, but denies any injury to the knee or other complications. She is currently in the third month of a six-month nutrition program in preparation for the surgery, focusing on increasing protein intake and improving overall diet. She expresses some difficulty distinguishing between symptoms of asthma and anxiety, but reports feeling well at the time of the visit.  The patient also reports difficulty sleeping, despite taking Ambien  nightly. She attributes some of this difficulty to the recent death of her daughter, the cause of which is currently unknown pending autopsy results. The patient also mentions a relationship with a pain management clinic, but does not elaborate on the reason for this treatment.   Assessment and Plan:    Asthma Stable with clear lung sounds on examination. Currently on Tezspire  and Trelegy 200. -Continue current regimen. -Refill Trelegy 200 for 1 year.  Insomnia Patient reports poor sleep despite taking Ambien . Possible contributing factors include recent bereavement. -Consider non-pharmacological interventions for insomnia such as reading before bed.  Bariatric Surgery Patient is in the process of obtaining clearance for bariatric surgery. Currently in the third month of a six-month nutrition program. -Continue with nutrition program. -Schedule follow-up in three months to assess progress and provide any necessary documentation for surgery.  Bereavement Patient's daughter passed away recently. Autopsy results are pending.-Consider referral for grief counseling if patient is open to it.  Assessment & Plan  09/08/23- 53 year old female Smoker followed for chronic severe Asthma/ COPD/Xolair , Chronic Hypoxic Respiratory Failure, [facility]acheostomy [facility] 2019, Insomnia,  complicated by noncompliance, OSA/ oral appliance, morbid Obesity, HTN, depression, GERD Covid infection, PAFib/ Eliquis , dCHF, Dyslipidemia, Depression,  Dupixent  enrolled  10/22/2018 changed to Tezspire  2023. O2 2L prn -Proair  hfa, Trelegy  200, Singulair , Neb Brovana , Tezspire , Ambien  10, Vyvanse , Neb Brovana ?, Phentermine ,  Body weight today- 309 lbs -----Not sleeping well.  Would like to go back on Klonopin .  Nasal congestion making it harder to sleep.  Would like to use the previous nasal spray (does not remember the name).  Getting ready for her bariatric surgery - weight is fluctuating. Continues with nutrition program. Anticipates her bariatric surgery will be late summer. April [name] definitely helps with asthma control.  Discussed the use of AI scribe software for clinical note transcription with the patient, who gave verbal consent to proceed.  History of Present Illness   April [name] is a 53 year old female who presents for follow-up regarding her upcoming bariatric surgery and related concerns.  She experiences nasal congestion, particularly in the mornings, and has previously used Flonase  nasal spray with good effect. She has run out of the spray and seeks assistance in obtaining more.  She is currently on Tezspire  for asthma management and finds it helpful. She reports good breathing overall and has not been hospitalized recently for asthma-related issues.   Zolpidem  has not helped with chronic insomnia and she asks to go back to clonazepam , which worked much better.     Assessment and Plan:  Addendum for surgical clearance 09/18/23:  She is clear from pulmonary standpoint for planned surgery, which should be done in hospital with Pulmonary consultation available if needed. She is at higher than average risk due to her multiple medical problems.    Asthma moderate persistent uncomplicated -well controlled on current regimen  Allergic rhinitis Nasal congestion consistent with allergic rhinitis. Flonase  previously effective. - Prescribe Flonase  nasal spray.  Insomnia Zopium ineffective. Clonazepam  considered as alternative for improved sleep. -  Prescribe clonazepam  to be taken 20 minutes before bedtime.  Obesity Preparing for bariatric surgery. Previous weight loss medications ineffective. Committed to process and aware of post-surgery challenges. - Schedule nutritionist appointment. - Await insurance approval.     Review of Systems-See HPI  + = positive Constitutional:   + weight loss, night sweats, fevers, chills, +fatigue, lassitude. HEENT:   No-  headaches, difficulty swallowing, tooth/dental problems, sore throat,    sneezing, itching, ear ache, +nasal congestion, post nasal drip,  CV:  No-   chest pain, orthopnea, PND, swelling in lower extremities, anasarca, dizziness, palpitations Resp: +shortness of breath with exertion or at rest.              productive cough,  + non-productive cough,  No- coughing up of blood.              No-   change in color of mucus. +wheezing.   Skin: No-   rash or lesions. GI:  No-   heartburn, indigestion, abdominal pain, nausea, vomiting,  GU: MS:  No-   joint pain or swelling.   Neuro-     nothing unusual Psych:  No- change in mood or affect. No depression or anxiety.  No memory loss.    Objective:   Physical Exam    General- Alert, Oriented, Affect-appropriate, Distress- none acute, + morbid  obesity,  Skin- rash-none, lesions- none, excoriation- none Lymphadenopathy- none Head- atraumatic            Eyes- Gross vision intact, PERRLA, conjunctivae clear secretions            Ears- Hearing, canals-normal            Nose- Clear, no-Septal dev, mucus, polyps, erosion, perforation.            Throat- Mallampati III , mucosa clear , drainage- none, tonsils- atrophic,                           +  Missing  teeth,                 Neck- flexible , trachea+ stoma scar, no stridor , thyroid nl, carotid no bruit Chest - symmetrical excursion , unlabored           Heart/CV- RRR , no murmur , no gallop  , no rub, nl s1 s2                           - JVD- none , edema- none, stasis changes- none,  varices- none           Lung- +Diminished, . Wheeze-none, cough-none,  dullness-none,  rub- none            Chest wall Abd-  Br/ Gen/ Rectal- Not done, not indicated Extrem- cyanosis- none, clubbing, none, atrophy- none, strength- nl Neuro- grossly intact to observation

## 2023-09-08 ENCOUNTER — Ambulatory Visit (INDEPENDENT_AMBULATORY_CARE_PROVIDER_SITE_OTHER): Payer: 59 | Admitting: Internal Medicine

## 2023-09-08 ENCOUNTER — Encounter: Payer: Self-pay | Admitting: Internal Medicine

## 2023-09-08 VITALS — BP 118/72 | HR 80 | Temp 98.0°F | Ht 66.0 in | Wt 309.6 lb

## 2023-09-08 DIAGNOSIS — G4700 Insomnia, unspecified: Secondary | ICD-10-CM

## 2023-09-08 DIAGNOSIS — E669 Obesity, unspecified: Secondary | ICD-10-CM | POA: Diagnosis not present

## 2023-09-08 DIAGNOSIS — Z6841 Body Mass Index (BMI) 40.0 and over, adult: Secondary | ICD-10-CM

## 2023-09-08 DIAGNOSIS — J45909 Unspecified asthma, uncomplicated: Secondary | ICD-10-CM

## 2023-09-08 DIAGNOSIS — Z87891 Personal history of nicotine dependence: Secondary | ICD-10-CM

## 2023-09-08 DIAGNOSIS — J4489 Other specified chronic obstructive pulmonary disease: Secondary | ICD-10-CM

## 2023-09-08 MED ORDER — FLUTICASONE PROPIONATE 50 MCG/ACT NA SUSP: NASAL | 5 refills | Status: AC

## 2023-09-08 MED ORDER — CLONAZEPAM 0.5 MG PO TABS: ORAL_TABLET | ORAL | 5 refills | Status: DC

## 2023-09-08 NOTE — Patient Instructions (Signed)
 Refills sent for clonazepam  and flonase  nasal spray

## 2023-09-11 ENCOUNTER — Encounter: Payer: Self-pay | Admitting: Dietician

## 2023-09-11 ENCOUNTER — Emergency Department (HOSPITAL_COMMUNITY)

## 2023-09-11 ENCOUNTER — Emergency Department (HOSPITAL_COMMUNITY)
Admission: EM | Admit: 2023-09-11 | Discharge: 2023-09-11 | Disposition: A | Discharge status: Home / Self Care | Attending: Emergency Medicine | Admitting: Emergency Medicine

## 2023-09-11 ENCOUNTER — Other Ambulatory Visit: Payer: Self-pay

## 2023-09-11 ENCOUNTER — Encounter: Attending: Surgery | Admitting: Dietician

## 2023-09-11 VITALS — Ht 65.0 in | Wt 308.8 lb

## 2023-09-11 DIAGNOSIS — J4521 Mild intermittent asthma with (acute) exacerbation: Secondary | ICD-10-CM | POA: Diagnosis not present

## 2023-09-11 DIAGNOSIS — Z7901 Long term (current) use of anticoagulants: Secondary | ICD-10-CM | POA: Insufficient documentation

## 2023-09-11 DIAGNOSIS — Z713 Dietary counseling and surveillance: Secondary | ICD-10-CM | POA: Insufficient documentation

## 2023-09-11 DIAGNOSIS — E669 Obesity, unspecified: Secondary | ICD-10-CM | POA: Diagnosis not present

## 2023-09-11 DIAGNOSIS — I509 Heart failure, unspecified: Secondary | ICD-10-CM | POA: Insufficient documentation

## 2023-09-11 DIAGNOSIS — R0981 Nasal congestion: Secondary | ICD-10-CM | POA: Diagnosis present

## 2023-09-11 DIAGNOSIS — E876 Hypokalemia: Secondary | ICD-10-CM | POA: Insufficient documentation

## 2023-09-11 DIAGNOSIS — D72829 Elevated white blood cell count, unspecified: Secondary | ICD-10-CM | POA: Insufficient documentation

## 2023-09-11 DIAGNOSIS — J069 Acute upper respiratory infection, unspecified: Secondary | ICD-10-CM

## 2023-09-11 DIAGNOSIS — Z6841 Body Mass Index (BMI) 40.0 and over, adult: Secondary | ICD-10-CM | POA: Insufficient documentation

## 2023-09-11 DIAGNOSIS — J4489 Other specified chronic obstructive pulmonary disease: Secondary | ICD-10-CM | POA: Insufficient documentation

## 2023-09-11 DIAGNOSIS — J45901 Unspecified asthma with (acute) exacerbation: Secondary | ICD-10-CM

## 2023-09-11 LAB — CBC WITH DIFFERENTIAL/PLATELET
Abs Immature Granulocytes: 0.11 10*3/uL — ABNORMAL HIGH (ref 0.00–0.07)
Basophils Absolute: 0.1 10*3/uL (ref 0.0–0.1)
Basophils Relative: 0 %
Eosinophils Absolute: 0 10*3/uL (ref 0.0–0.5)
Eosinophils Relative: 0 %
HCT: 42.6 % (ref 36.0–46.0)
Hemoglobin: 13.1 g/dL (ref 12.0–15.0)
Immature Granulocytes: 1 %
Lymphocytes Relative: 26 %
Lymphs Abs: 3.8 10*3/uL (ref 0.7–4.0)
MCH: 27.1 pg (ref 26.0–34.0)
MCHC: 30.8 g/dL (ref 30.0–36.0)
MCV: 88 fL (ref 80.0–100.0)
Monocytes Absolute: 1.2 10*3/uL — ABNORMAL HIGH (ref 0.1–1.0)
Monocytes Relative: 8 %
Neutro Abs: 9.6 10*3/uL — ABNORMAL HIGH (ref 1.7–7.7)
Neutrophils Relative %: 65 %
Platelets: 387 10*3/uL (ref 150–400)
RBC: 4.84 MIL/uL (ref 3.87–5.11)
RDW: 17.2 % — ABNORMAL HIGH (ref 11.5–15.5)
WBC: 14.8 10*3/uL — ABNORMAL HIGH (ref 4.0–10.5)
nRBC: 0 % (ref 0.0–0.2)

## 2023-09-11 LAB — BASIC METABOLIC PANEL WITH GFR
Anion gap: 9 (ref 5–15)
BUN: 19 mg/dL (ref 6–20)
CO2: 20 mmol/L — ABNORMAL LOW (ref 22–32)
Calcium: 8.6 mg/dL — ABNORMAL LOW (ref 8.9–10.3)
Chloride: 111 mmol/L (ref 98–111)
Creatinine, Ser: 0.32 mg/dL — ABNORMAL LOW (ref 0.44–1.00)
GFR, Estimated: >60 mL/min (ref >60)
Glucose, Bld: 127 mg/dL — ABNORMAL HIGH (ref 70–99)
Potassium: 3.2 mmol/L — ABNORMAL LOW (ref 3.5–5.1)
Sodium: 140 mmol/L (ref 135–145)

## 2023-09-11 LAB — RESP PANEL BY RT-PCR (RSV, FLU A&B, COVID)  RVPGX2
Influenza A by PCR: NEGATIVE
Influenza B by PCR: NEGATIVE
Resp Syncytial Virus by PCR: NEGATIVE
SARS Coronavirus 2 by RT PCR: NEGATIVE

## 2023-09-11 LAB — HCG, SERUM, QUALITATIVE: Preg, Serum: NEGATIVE

## 2023-09-11 LAB — BRAIN NATRIURETIC PEPTIDE: B Natriuretic Peptide: 58.8 pg/mL (ref 0.0–100.0)

## 2023-09-11 MED ORDER — IPRATROPIUM-ALBUTEROL 0.5-2.5 (3) MG/3ML IN SOLN
3.0000 mL | Freq: Once | RESPIRATORY_TRACT | Status: AC
  Administered 2023-09-11: 3 mL via RESPIRATORY_TRACT
  Filled 2023-09-11: qty 3

## 2023-09-11 MED ORDER — PREDNISONE 10 MG PO TABS: 50.0000 mg | ORAL_TABLET | Freq: Every day | ORAL | 0 refills | Status: AC

## 2023-09-11 MED ORDER — PREDNISONE 50 MG PO TABS
50.0000 mg | ORAL_TABLET | Freq: Once | ORAL | Status: AC
  Administered 2023-09-11: 50 mg via ORAL
  Filled 2023-09-11: qty 1

## 2023-09-11 NOTE — ED Notes (Signed)
 Pt reports she doesn't want her d/c papers because she will be going to the hospital again since she doesn't feel comfortable going home. MD aware.

## 2023-09-11 NOTE — ED Notes (Signed)
 Pt ambulated outside room. Pt o2 stayed 99-100 room air. Pt stated she still feels the same since she got here. RN notified.

## 2023-09-11 NOTE — Discharge Instructions (Addendum)
 Today you were seen for an asthma exacerbation, I suspect this is likely due to a URI.  You have been prescribed a short course of prednisone .  You may also take over-the-counter medications such as plain Mucinex  for chest congestion, Flonase  for nasal congestion, and do saline rinses.  Please take as directed.  Please return to the ED if you have worsening symptoms, increasing shortness of breath, or uncontrollable fever/vomiting.  Please follow-up with your primary care in the upcoming days for further evaluation workup.  Thank you for letting us  treat you today. After reviewing your labs and imaging, I feel you are safe to go home. Please follow up with your PCP in the next several days and provide them with your records from this visit. Return to the Emergency Room if pain becomes severe or symptoms worsen.

## 2023-09-11 NOTE — Progress Notes (Signed)
 Supervised Weight Loss Visit Bariatric Nutrition Education Appt Start Time: 1534    End Time: 1554  Planned surgery: RYGB Pt expectation of surgery: lose weight to be healthy  5 out of 6 SWL Appointments   NUTRITION ASSESSMENT   Anthropometrics  Start weight at [facility]: 323.9 lbs (date: 05/01/2023)  Height: 65 in Weight today: 308.8 lbs BMI: 51.39 kg/m2     Clinical   Pharmacotherapy: History of weight loss medication used: phentermine , Ozempic  (not tolerated)  Medical hx: asthma, COPD, HTN Medications:  omeprazole , gabapentin , albuterol , clonazepam , epinephrine , famotidine , Flonase , Rybelsus , Ambien , vit D, calcium   Labs: Chol 214; Triglycerides 171; LDL 127; A1c 5.8, folate 2.4, Vit D 25.5 Notable signs/symptoms: walking slow Any previous deficiencies? No  Lifestyle & Dietary Hx  Pt states her asthma has been acting up, stating she is going to [employer] when she leaves here. Pt states she is getting 60 grams of protein daily, stating she is pre-packing meals and snacks, stating she tries to eat even when not hungry. Pt states she is eating frequently through out the day, but states sometimes she does not eat, stating she does not feel like it.  Estimated daily fluid intake: 64+ oz per day Supplements: vit D, calcium  Current average weekly physical activity: treadmill every other day, 10-15 minutes, gym 4 days per week for 20-30 minutes  24-Hr Dietary Recall First Meal: Ensure Max protein shake or skip or egg and oatmeal and banana Snack:  Second Meal: tries not to skip; baked chicken or fish, grain, fruit Snack: soda Third Meal: chicken or pork chop, greens or spinach, rice or mashed potatoes Snack: popcorn Beverages: water  with flavorings  Estimated Energy Needs Calories: 1500  NUTRITION DIAGNOSIS  Overweight/obesity ([place]-3.3) related to past poor dietary habits and physical inactivity as evidenced by patient w/ planned RYGB surgery following dietary guidelines for  continued weight loss.  NUTRITION INTERVENTION  Nutrition counseling (C-1) and education (E-2) to facilitate bariatric surgery goals.  Eating small, frequent meals throughout the day is a cornerstone of the dietary guidelines for bariatric surgery patients, both before and especially after the procedure. Getting Used to Smaller Portions: Starting to eat smaller, more frequent meals before surgery helps patients adjust to the portion sizes they will be able to tolerate after the procedure. This can make the transition smoother and less shocking to the body. Practicing Mindful Eating: Eating smaller meals encourages patients to eat more slowly and pay attention to their hunger and fullness cues. This is a crucial skill to develop for long-term success after surgery. Stabilizing Blood Sugar: Frequent, smaller meals can help regulate blood sugar levels, often associated with obesity. Reducing Overeating: By eating more regularly, patients may be less likely to experience extreme hunger that can lead to overeating at meal times.  Pre-Op Goals Progress & New Goals Continue: taking folate (ask PCP or surgeon if you should start taking) and Vit D Continue: Switch to non-caloric, non-carbonated and non-caffeinated beverages such as  water , unsweetened tea, Crystal Light and zero calorie beverages (aim for 64 oz. per day) Re-engage: increase physical activity. Engage in gym activity  20 minutes 4 time per week and at home on the treadmill. Continue:  Eat every 3-5 hours during the day; have a protein with every meal and snack  Continue: increase non-starchy vegetables; aim for 2 or more servings per day. Continue: research bariatric vitamins to identify one you might start taking after surgery. Continue: meal plan and prep food for the next day; increase  protein from food and decrease protein from protein shakes; aim for 60 grams per day; have protein with every meal or snack; use the plate method for  portion sizes.  Handouts Provided Include    Learning Style & Readiness for Change Teaching method utilized: Visual & Auditory  Demonstrated degree of understanding via: Teach Back  Readiness Level: preparation Barriers to learning/adherence to lifestyle change: mobility  RD's Notes for next Visit  Patient progress toward chosen goals  MONITORING & EVALUATION Dietary intake, weekly physical activity, body weight, and pre-op goals in 1 month.   Next Steps  Patient is to return to [facility] in 1 month for next SWL visit.

## 2023-09-11 NOTE — ED Triage Notes (Signed)
 Pt states that she has been having trouble controlling her asthma for 1 week. Pt now complaining of chest pain from congestion. Pt is alert and oriented and does not appear to be in respiratory distress at this time.

## 2023-09-11 NOTE — ED Provider Notes (Signed)
 [place] EMERGENCY DEPARTMENT AT [facility] Provider Note   CSN: [id number] Arrival date & time: 09/11/23  1629     History  Chief Complaint  Patient presents with   Asthma   Nasal Congestion    April [name] is a 53 y.o. female past medical history significant for COPD, asthma and CHF presents today for feeling like her asthma has not been well-controlled for over a week now.  Patient also endorses chest congestion, productive cough, headache and sinus congestion.  Patient also reports feeling short of breath and retaining fluid.  Patient denies nausea, vomiting, fever, chest pain, diarrhea, abdominal pain, or any other complaints at this time.   Asthma Associated symptoms include headaches and shortness of breath.       Home Medications Prior to Admission medications   Medication Sig Start Date End Date Taking? Authorizing Provider  predniSONE  (DELTASONE ) 10 MG tablet Take 5 tablets (50 mg total) by mouth daily for 5 days. 09/11/23 09/16/23 Yes Vara Mairena N, PA-C  albuterol  (VENTOLIN  HFA) 108 (90 Base) MCG/ACT inhaler INHALE 2 PUFFS INTO THE LUNGS EVERY 6 HOURS AS NEEDED FOR WHEEZING OR SHORTNESS OF BREATH 09/01/23   Rosa College D, MD  amLODipine  (NORVASC ) 10 MG tablet Take 1 tablet (10 mg total) by mouth daily. 02/05/23   Newlin, Enobong, MD  atorvastatin  (LIPITOR) 20 MG tablet Take 1 tablet (20 mg total) by mouth daily. 02/05/23   Newlin, Enobong, MD  carvedilol  (COREG ) 6.25 MG tablet Take 1 tablet (6.25 mg total) by mouth 2 (two) times daily with a meal. 02/05/23   Joaquin Mulberry, MD  clonazePAM  (KLONOPIN ) 0.5 MG tablet 1 or 2 tabs at bedtime as needed for sleep 09/08/23   Rosa [employer] D, MD  cloNIDine  (CATAPRES ) 0.2 MG tablet TAKE 1 TABLET BY MOUTH TWICE DAILY 08/25/23   Newlin, Enobong, MD  docusate sodium  (COLACE) 100 MG capsule Take 1 capsule (100 mg total) by mouth 2 (two) times daily. 06/12/23   Newlin, Enobong, MD  ELIQUIS  5 MG TABS tablet TAKE 1 TABLET(5  MG) BY MOUTH TWICE DAILY 08/01/23   Newlin, Enobong, MD  famotidine  (PEPCID ) 40 MG tablet Take 40 mg by mouth at bedtime. 01/25/22   [provider]  Fezolinetant  (VEOZAH ) 45 MG TABS Take 1 tablet (45 mg total) by mouth daily. 12/02/22   Newlin, Enobong, MD  fluticasone  (FLONASE ) 50 MCG/ACT nasal spray 1-2 puffs each nostril at bedtime while needed 09/08/23   Faustina Hood, MD  Fluticasone -Umeclidin-Vilant (TRELEGY ELLIPTA ) 200-62.5-25 MCG/ACT AEPB Inhale 1 puff into the lungs daily. 06/09/23   Rosa [employer] D, MD  furosemide  (LASIX ) 20 MG tablet TAKE 1 TABLET(20 MG) BY MOUTH DAILY 10/08/22   Newlin, Enobong, MD  gabapentin  (NEURONTIN ) 300 MG capsule Take 300 mg by mouth 3 (three) times daily.    [provider]  ipratropium-albuterol  (DUONEB) 0.5-2.5 (3) MG/3ML SOLN Take 3 mLs by nebulization every 4 (four) hours as needed (shortness of breath or wheezing). 01/26/23 07/25/23  Unk Garb, DO  levothyroxine  (SYNTHROID ) 50 MCG tablet Take 1 tablet (50 mcg total) by mouth daily before breakfast. 05/21/23   Newlin, Enobong, MD  methocarbamol  (ROBAXIN ) 750 MG tablet Take 750 mg by mouth 3 (three) times daily. 03/05/23   [provider]  montelukast  (SINGULAIR ) 10 MG tablet Take 1 tablet (10 mg total) by mouth at bedtime. 06/09/23   Rosa [employer] D, MD  naloxone  (NARCAN ) nasal spray 4 mg/0.1 mL Place 1 spray into the nose once  as needed (opioid overdose). 08/09/20   [provider]  omeprazole  (PRILOSEC ) 40 MG capsule Take 40 mg by mouth at bedtime.    [provider]  oxyCODONE -acetaminophen  (PERCOCET) 10-325 MG tablet Take 1 tablet by mouth 5 (five) times daily as needed for pain. 03/26/22     Potassium Chloride  ER 20 MEQ TBCR Take 1 tablet (20 mEq total) by mouth daily. 07/07/23   Newlin, Enobong, MD  pregabalin  (LYRICA ) 50 MG capsule Take 50 mg by mouth 3 (three) times daily as needed. 03/07/23   [provider]  RYBELSUS  3 MG TABS TAKE 1 TABLET(3 MG) BY MOUTH  DAILY 07/09/23   Newlin, Enobong, MD  solifenacin  (VESICARE ) 5 MG tablet TAKE 1 TABLET(5 MG) BY MOUTH DAILY 07/16/23   Newlin, Enobong, MD  Tezepelumab -ekko (TEZSPIRE ) 210 MG/1. SOAJ Inject 210 mg into the skin every 28 (twenty-eight) days. 07/29/23   Rosa [employer] D, MD  tiZANidine  (ZANAFLEX ) 2 MG tablet Take 2 mg by mouth 3 (three) times daily as needed. 05/08/23   [provider]  topiramate  (TOPAMAX ) 200 MG tablet Take 200 mg by mouth at bedtime. 01/08/21   [provider]  venlafaxine  XR (EFFEXOR -XR) 75 MG 24 hr capsule TAKE 1 CAPSULE(75 MG) BY MOUTH DAILY WITH BREAKFAST FOR HOT FLASHES 02/11/23   Newlin, Lavelle Posey, MD  Vitamin D , Ergocalciferol , (DRISDOL ) 1.25 MG (50000 UNIT) CAPS capsule Take 1 capsule (50,000 Units total) by mouth every 7 (seven) days. 07/07/23   Newlin, Enobong, MD  mometasone -formoterol  (DULERA ) 200-5 MCG/ACT AERO Inhale 2 puffs into the lungs 2 (two) times daily. Dx: Asthma 04/08/19 08/12/19  Faustina Hood, MD      Allergies    Other and Contrast media [iodinated contrast media]    Review of Systems   Review of Systems  HENT:  Positive for congestion and sinus pressure.   Respiratory:  Positive for cough and shortness of breath.   Neurological:  Positive for headaches.    Physical Exam Updated Vital Signs BP (!) 172/86   Pulse 81   Temp 98 F (36.7 C) (Oral)   Resp 20   Ht 5\' 5"  (1.651 m)   Wt (!) 140 kg   SpO2 100%   BMI 51.36 kg/m  Physical Exam Vitals and nursing note reviewed.  Constitutional:      General: She is not in acute distress.    Appearance: She is well-developed. She is ill-appearing. She is not toxic-appearing or diaphoretic.  HENT:     Head: Normocephalic and atraumatic.     Right Ear: External ear normal.     Left Ear: External ear normal.     Nose: Congestion present.     Right Sinus: Maxillary sinus tenderness and frontal sinus tenderness present.     Left Sinus: Maxillary sinus tenderness and frontal sinus  tenderness present.     Mouth/Throat:     Mouth: Mucous membranes are moist.     Pharynx: Oropharynx is clear. Uvula midline.     Tonsils: No tonsillar abscesses.  Eyes:     Conjunctiva/sclera: Conjunctivae normal.  Cardiovascular:     Rate and Rhythm: Normal rate and regular rhythm.     Heart sounds: No murmur heard. Pulmonary:     Effort: Pulmonary effort is normal. No tachypnea or respiratory distress.     Breath sounds: Examination of the right-upper field reveals rhonchi. Examination of the left-upper field reveals rhonchi. Examination of the right-middle field reveals rhonchi. Examination of the left-middle field reveals rhonchi. Examination  of the right-lower field reveals rhonchi. Examination of the left-lower field reveals rhonchi. Rhonchi present.  Abdominal:     Palpations: Abdomen is soft.     Tenderness: There is no abdominal tenderness.  Musculoskeletal:        General: No swelling.     Cervical back: Neck supple.  Skin:    General: Skin is warm and dry.     Capillary Refill: Capillary refill takes less than 2 seconds.  Neurological:     Mental Status: She is alert.  Psychiatric:        Mood and Affect: Mood normal.     ED Results / Procedures / Treatments   Labs (all labs ordered are listed, but only abnormal results are displayed) Labs Reviewed  BASIC METABOLIC PANEL WITH GFR - Abnormal; Notable for the following components:      Result Value   Potassium 3.2 (*)    CO2 20 (*)    Glucose, Bld 127 (*)    Creatinine, Ser 0.32 (*)    Calcium  8.6 (*)    All other components within normal limits  CBC WITH DIFFERENTIAL/PLATELET - Abnormal; Notable for the following components:   WBC 14.8 (*)    RDW 17.2 (*)    Neutro Abs 9.6 (*)    Monocytes Absolute 1.2 (*)    Abs Immature Granulocytes 0.11 (*)    All other components within normal limits  RESP PANEL BY RT-PCR (RSV, FLU A&B, COVID)  RVPGX2  BRAIN NATRIURETIC PEPTIDE  HCG, SERUM, QUALITATIVE     EKG None  Radiology DG Chest 2 View Result Date: 09/11/2023 CLINICAL DATA:  Cough. EXAM: CHEST - 2 VIEW COMPARISON:  05/29/2023. FINDINGS: The heart size and mediastinal contours are within normal limits. No focal consolidation, pleural effusion, or pneumothorax. No acute osseous abnormality. IMPRESSION: No acute cardiopulmonary findings. Electronically Signed   By: Mannie Seek M.D.   On: 09/11/2023 18:27    Procedures Procedures    Medications Ordered in ED Medications  ipratropium-albuterol  (DUONEB) 0.5-2.5 (3) MG/3ML nebulizer solution 3 mL (3 mLs Nebulization Given 09/11/23 1804)  predniSONE  (DELTASONE ) tablet 50 mg (50 mg Oral Given 09/11/23 2003)  ipratropium-albuterol  (DUONEB) 0.5-2.5 (3) MG/3ML nebulizer solution 3 mL (3 mLs Nebulization Given 09/11/23 2002)    ED Course/ Medical Decision Making/ A&P                                 Medical Decision Making  This patient presents to the ED for concern of shortness of breath and URI symptoms, this involves an extensive number of treatment options, and is a complaint that carries with it a high risk of complications and morbidity.  The differential diagnosis includes asthma exacerbation, COPD exacerbation, pneumonia, COVID, flu, RSV, viral URI, CHF exacerbation   Co morbidities that complicate the patient evaluation  COPD, asthma, CHF   Additional history obtained:  External records from outside source obtained and reviewed including previous admission documents   Lab Tests:  I Ordered, and personally interpreted labs.  The pertinent results include: Leukocytosis of 14.8, mild hypokalemia at 3.2, negative respiratory panel   Imaging Studies ordered:  I ordered imaging studies including chest x-ray I independently visualized and interpreted imaging which showed no acute cardiopulmonary findings I agree with the radiologist interpretation   Cardiac Monitoring: / EKG:  The patient was maintained on a  cardiac monitor.  I personally viewed and interpreted the cardiac monitored  which showed an underlying rhythm of: Sinus probable LAE   Problem List / ED Course / Critical interventions / Medication management  I ordered medication including DuoNeb x 2 for rhonchi, prednisone  for asthma  Reevaluation of the patient after these medicines showed that the patient improved I have reviewed the patients home medicines and have made adjustments as needed Patient able to ambulate and maintain O2 between 99 and 100% on room air.  Test / Admission - Considered:  Consider for admission or further workup however patient's vital signs, physical exam, labs, and imaging of been reassuring.  Patient does have leukocytosis at 14.8, however patient's x-ray has no concerning findings for pneumonia and patient has no signs or symptoms concerning for other acute bacterial infection.  Patient's symptoms likely due to upper respiratory infection.  Patient given short course of prednisone  outpatient and advised to follow-up with her PCP in the upcoming days.  Patient given strict return precautions.  I feel patient is safe for discharge at this time.        Final Clinical Impression(s) / ED Diagnoses Final diagnoses:  Mild asthma with exacerbation, unspecified whether persistent  Upper respiratory tract infection, unspecified type    Rx / DC Orders ED Discharge Orders          Ordered    predniSONE  (DELTASONE ) 10 MG tablet  Daily        09/11/23 1927              Carie Charity, PA-C 09/11/23 2046    Mordecai Applebaum, MD 09/12/23 1526

## 2023-09-12 ENCOUNTER — Other Ambulatory Visit: Payer: Self-pay

## 2023-09-12 ENCOUNTER — Ambulatory Visit: Payer: Self-pay | Admitting: *Deleted

## 2023-09-12 NOTE — Telephone Encounter (Signed)
 Call to offer patient VV for Monday. Unable to reach Vm left

## 2023-09-12 NOTE — Telephone Encounter (Signed)
 Patient on walk in schedule for Monday

## 2023-09-12 NOTE — Telephone Encounter (Signed)
  Chief Complaint: SOB with coughing and exertion. Seen in ED yesterday and reports medications not working Symptoms: cough congestion, SOB with exertion and coughing. Hard to sleep headache  Frequency: 1 week  Pertinent Negatives: Patient denies chest pain no difficulty breathing no fever Disposition: [x] ED /[] Urgent Care (no appt availability in office) / [] Appointment(In office/virtual)/ []  [facility]/ [] Home Care/ [] Refused Recommended Disposition /[] [place] [facility]/ []  Follow-up with PCP Additional Notes:   No available appt with PCP or other providers today.  Last ED visit yesterday. Reports prednisone  , mucinex  not working. Reports ED did not give normal medications and treatments they usually do and now patient not feeling better. Recommended to go back to ED if sx worsening. Please advise.      Copied from CRM [id number]. Topic: Clinical - Red Word Triage >> Sep 12, 2023 12:21 PM Felizardo Hotter wrote: Red Word that prompted transfer to Nurse Triage: Pt called stated she went to ER but is still having trouble breath. She has congestion, and coughing. Reason for Disposition  [1] MILD difficulty breathing (e.g., minimal/no SOB at rest, SOB with walking, pulse <100) AND [2] NEW-onset or WORSE than normal  Answer Assessment - Initial Assessment Questions 1. RESPIRATORY STATUS: "Describe your breathing?" (e.g., wheezing, shortness of breath, unable to speak, severe coughing)      Shortness of breath coughing and with exertion 2. ONSET: "When did this breathing problem begin?"      Yesterday  3. PATTERN "Does the difficult breathing come and go, or has it been constant since it started?"      Comes and goes  4. SEVERITY: "How bad is your breathing?" (e.g., mild, moderate, severe)    - MILD: No SOB at rest, mild SOB with walking, speaks normally in sentences, can lie down, no retractions, pulse < 100.    - MODERATE: SOB at rest, SOB with minimal exertion and prefers to sit,  cannot lie down flat, speaks in phrases, mild retractions, audible wheezing, pulse 100-120.    - SEVERE: Very SOB at rest, speaks in single words, struggling to breathe, sitting hunched forward, retractions, pulse > 120      SOB with coughing and exertion 5. RECURRENT SYMPTOM: "Have you had difficulty breathing before?" If Yes, ask: "When was the last time?" and "What happened that time?"      Yes seen in ED yesterday  6. CARDIAC HISTORY: "Do you have any history of heart disease?" (e.g., heart attack, angina, bypass surgery, angioplasty)      na 7. LUNG HISTORY: "Do you have any history of lung disease?"  (e.g., pulmonary embolus, asthma, emphysema)     Hc COPD 8. CAUSE: "What do you think is causing the breathing problem?"      bronchitis 9. OTHER SYMPTOMS: "Do you have any other symptoms? (e.g., dizziness, runny nose, cough, chest pain, fever)     Cough, SOB congestion headache  10. O2 SATURATION MONITOR:  "Do you use an oxygen  saturation monitor (pulse oximeter) at home?" If Yes, ask: "What is your reading (oxygen  level) today?" "What is your usual oxygen  saturation reading?" (e.g., 95%)       na 11. PREGNANCY: "Is there any chance you are pregnant?" "When was your last menstrual period?"       na 12. TRAVEL: "Have you traveled out of the country in the last month?" (e.g., travel history, exposures)       na  Protocols used: Breathing Difficulty-A-AH

## 2023-09-12 NOTE — Progress Notes (Signed)
 Specialty Pharmacy Refill Coordination Note  April [name] is a 53 y.o. female contacted today regarding refills of specialty medication(s) Tezepelumab -ekko (Tezspire )   Patient requested Courier to Provider Office   Delivery date: 09/18/23   Verified address: [employer] [address]   Medication will be filled on 09/17/23.

## 2023-09-15 ENCOUNTER — Ambulatory Visit

## 2023-09-17 ENCOUNTER — Telehealth: Payer: Self-pay | Admitting: Internal Medicine

## 2023-09-17 NOTE — Telephone Encounter (Signed)
 April [name] is calling. [phone] Needs surgical clearance for bariatric surgery for this PT. Just seen by Dr. Linder Revere. She will fax a form to Surgical clearance attn today. Adv I will let [name] know.

## 2023-09-18 NOTE — Telephone Encounter (Signed)
 Fax received from Dr. Lanell Pinta with [facility] to perform a Laparoscopic Roux-en-Y on patient.  Patient needs surgery clearance. Surgery is pending. Patient was seen on 09/08/23. Office protocol is a risk assessment can be sent to surgeon if patient has been seen in 60 days or less.   Sending to Dr Linder Revere for risk assessment or recommendations if patient needs to be seen in office prior to surgical procedure.

## 2023-09-18 NOTE — Telephone Encounter (Signed)
 I have added a surgical clearance addendum to my last office note.

## 2023-09-19 NOTE — Telephone Encounter (Signed)
 Note with risk assessment faxed to [name] at [facility] [phone].

## 2023-09-23 ENCOUNTER — Ambulatory Visit (INDEPENDENT_AMBULATORY_CARE_PROVIDER_SITE_OTHER)

## 2023-09-23 VITALS — BP 185/82 | HR 90 | Temp 98.0°F | Resp 20 | Ht 65.0 in | Wt 313.2 lb

## 2023-09-23 DIAGNOSIS — J4489 Other specified chronic obstructive pulmonary disease: Secondary | ICD-10-CM | POA: Diagnosis not present

## 2023-09-23 DIAGNOSIS — J4552 Severe persistent asthma with status asthmaticus: Secondary | ICD-10-CM | POA: Diagnosis not present

## 2023-09-23 MED ORDER — TEZEPELUMAB-EKKO 210 MG/1.91ML ~~LOC~~ SOSY
210.0000 mg | PREFILLED_SYRINGE | Freq: Once | SUBCUTANEOUS | Status: AC
  Administered 2023-09-23: 210 mg via SUBCUTANEOUS
  Filled 2023-09-23: qty 1.91

## 2023-09-23 NOTE — Progress Notes (Signed)
 Diagnosis: Asthma  Provider:  Chilton Greathouse MD  Procedure: Injection  Tezspire (Tezepelumab), Dose: 210 mg, Site: subcutaneous, Number of injections: 1  Injection Site(s): Right arm  Post Care: Patient declined observation  Discharge: Condition: Good, Destination: Home . AVS Declined  Performed by:  Adriana Mccallum, RN

## 2023-10-09 ENCOUNTER — Encounter: Attending: Surgery | Admitting: Dietician

## 2023-10-09 ENCOUNTER — Encounter: Payer: Self-pay | Admitting: Dietician

## 2023-10-09 VITALS — Ht 65.0 in | Wt 324.7 lb

## 2023-10-09 DIAGNOSIS — Z6841 Body Mass Index (BMI) 40.0 and over, adult: Secondary | ICD-10-CM | POA: Diagnosis not present

## 2023-10-09 DIAGNOSIS — Z713 Dietary counseling and surveillance: Secondary | ICD-10-CM | POA: Diagnosis not present

## 2023-10-09 DIAGNOSIS — E669 Obesity, unspecified: Secondary | ICD-10-CM | POA: Insufficient documentation

## 2023-10-09 NOTE — Progress Notes (Signed)
 Supervised Weight Loss Visit Bariatric Nutrition Education Appt Start Time: 1538    End Time: 1557  Planned surgery: RYGB Pt expectation of surgery: lose weight to be healthy  6 out of 6 SWL Appointments  Pt completed visits.   Pt has cleared nutrition requirements.   NUTRITION ASSESSMENT   Anthropometrics  Start weight at [facility]: 323.9 lbs (date: 05/01/2023)  Height: 65 in Weight today: 324.7 lbs BMI: 54.03 kg/m2     Clinical   Pharmacotherapy: History of weight loss medication used: phentermine , Ozempic  (not tolerated)  Medical hx: asthma, COPD, HTN Medications:  omeprazole , gabapentin , albuterol , clonazepam , epinephrine , famotidine , Flonase , Rybelsus , Ambien , vit D, calcium   Labs: Chol 214; Triglycerides 171; LDL 127; A1c 5.8, folate 2.4, Vit D 25.5 Notable signs/symptoms: walking slow Any previous deficiencies? No  Lifestyle & Dietary Hx  Pt states she has been on prednisone  (steroid), for sickness. Pt has felt bad she has not lost weight. Pt states she has been making better food choices since beginning these visits, stating her physical activity has improved as well, stating she has more energy. Pt states she used to sleep all day, stating she is not napping these day. Pt states she needs to focus more on her portion sizes, stating she has been eating more since on steroids. Pt states she is eating throughout the day, stating just needs smaller portions. Pt states she has learned a lot, since starting this journey.  Estimated daily fluid intake: 64+ oz per day Supplements: vit D, calcium , folate Current average weekly physical activity: treadmill every other day, 10-15 minutes, gym 4 days per week for 30 minutes  24-Hr Dietary Recall First Meal: Ensure Max protein shake or skip or egg and oatmeal and banana Snack:  Second Meal: tries not to skip; baked chicken or fish, grain, fruit Snack: soda Third Meal: chicken or pork chop, greens or spinach, rice or mashed  potatoes Snack: popcorn Beverages: water  with flavorings  Estimated Energy Needs Calories: 1500  NUTRITION DIAGNOSIS  Overweight/obesity ([place]-3.3) related to past poor dietary habits and physical inactivity as evidenced by patient w/ planned RYGB surgery following dietary guidelines for continued weight loss.  NUTRITION INTERVENTION  Nutrition counseling (C-1) and education (E-2) to facilitate bariatric surgery goals.  Eating small, frequent meals throughout the day is a cornerstone of the dietary guidelines for bariatric surgery patients, both before and especially after the procedure. Getting Used to Smaller Portions: Starting to eat smaller, more frequent meals before surgery helps patients adjust to the portion sizes they will be able to tolerate after the procedure. This can make the transition smoother and less shocking to the body. Practicing Mindful Eating: Eating smaller meals encourages patients to eat more slowly and pay attention to their hunger and fullness cues. This is a crucial skill to develop for long-term success after surgery. Stabilizing Blood Sugar: Frequent, smaller meals can help regulate blood sugar levels, often associated with obesity. Reducing Overeating: By eating more regularly, patients may be less likely to experience extreme hunger that can lead to overeating at meal times.  Pre-Op Goals Progress & New Goals Continue: taking folate (ask PCP or surgeon if you should start taking) and Vit D Continue: Switch to non-caloric, non-carbonated and non-caffeinated beverages such as  water , unsweetened tea, Crystal Light and zero calorie beverages (aim for 64 oz. per day) Continue: increase physical activity. Engage in gym activity  45-60 minutes daily and at home on the treadmill. Continue:  Eat every 3-5 hours during the day; have  a protein with every meal and snack  Continue: increase non-starchy vegetables; aim for 2 or more servings per day. Continue: research  bariatric vitamins to identify one you might start taking after surgery. Continue: meal plan and prep food for the next day; increase protein from food and decrease protein from protein shakes; aim for 60 grams per day; have protein with every meal or snack; use the plate method for portion sizes.  Handouts Provided Include    Learning Style & Readiness for Change Teaching method utilized: Visual & Auditory  Demonstrated degree of understanding via: Teach Back  Readiness Level: preparation Barriers to learning/adherence to lifestyle change: mobility  RD's Notes for next Visit  Patient progress toward chosen goals  MONITORING & EVALUATION Dietary intake, weekly physical activity, body weight, and pre-op goals in 1 month.   Next Steps  Pt has completed visits. No further supervised visits required/recommended. Patient is to return to [facility] for pre-op class >2 weeks prior to scheduled surgery.

## 2023-10-14 ENCOUNTER — Other Ambulatory Visit: Payer: Self-pay | Admitting: Pharmacy Technician

## 2023-10-14 ENCOUNTER — Other Ambulatory Visit: Payer: Self-pay | Admitting: Internal Medicine

## 2023-10-14 ENCOUNTER — Other Ambulatory Visit: Payer: Self-pay

## 2023-10-14 DIAGNOSIS — J4489 Other specified chronic obstructive pulmonary disease: Secondary | ICD-10-CM

## 2023-10-14 DIAGNOSIS — J4541 Moderate persistent asthma with (acute) exacerbation: Secondary | ICD-10-CM

## 2023-10-14 MED ORDER — TEZSPIRE 210 MG/1.91ML ~~LOC~~ SOAJ
210.0000 mg | SUBCUTANEOUS | 5 refills | Status: DC
  Filled 2023-10-14: qty 1.91, 28d supply, fill #0
  Filled 2023-11-06: qty 1.91, 28d supply, fill #1
  Filled 2023-12-02 – 2023-12-22 (×2): qty 1.91, 28d supply, fill #2
  Filled 2024-01-14: qty 1.91, 28d supply, fill #3
  Filled 2024-02-16: qty 1.91, 28d supply, fill #4
  Filled 2024-03-15: qty 1.91, 28d supply, fill #5

## 2023-10-14 NOTE — Progress Notes (Signed)
 Specialty Pharmacy Refill Coordination Note  April [name] is a 53 y.o. female assessed today regarding refills of clinic administered specialty medication(s) Tezepelumab -ekko (Tezspire )   Clinic requested Courier to Provider Office   Delivery date: 10/16/23   Verified address: [employer]  [address]   Medication will be filled on 10/15/23.  Appt 10/21/23; RR sent to MD

## 2023-10-14 NOTE — Telephone Encounter (Signed)
 Refill sent for TEZSPIRE  to [facility]: [phone]   Dose: 210mg  subcut every 4 weeks  Last OV: 09/08/2023 Provider: Dr. Linder Revere  Next OV: 03/11/24  Geraldene Kleine, PharmD, MPH, BCPS Clinical Pharmacist (Rheumatology and Pulmonology)

## 2023-10-15 ENCOUNTER — Other Ambulatory Visit (HOSPITAL_COMMUNITY): Payer: Self-pay

## 2023-10-21 ENCOUNTER — Ambulatory Visit

## 2023-10-22 ENCOUNTER — Ambulatory Visit (INDEPENDENT_AMBULATORY_CARE_PROVIDER_SITE_OTHER)

## 2023-10-22 VITALS — BP 168/91 | HR 93 | Temp 98.5°F | Resp 24 | Ht 65.0 in | Wt 324.0 lb

## 2023-10-22 DIAGNOSIS — J4552 Severe persistent asthma with status asthmaticus: Secondary | ICD-10-CM | POA: Diagnosis not present

## 2023-10-22 DIAGNOSIS — J4489 Other specified chronic obstructive pulmonary disease: Secondary | ICD-10-CM

## 2023-10-22 MED ORDER — TEZEPELUMAB-EKKO 210 MG/1.91ML ~~LOC~~ SOSY
210.0000 mg | PREFILLED_SYRINGE | Freq: Once | SUBCUTANEOUS | Status: AC
  Administered 2023-10-22: 210 mg via SUBCUTANEOUS
  Filled 2023-10-22: qty 1.91

## 2023-10-22 NOTE — Progress Notes (Signed)
 Diagnosis: Asthma  Provider:  Chilton Greathouse MD  Procedure: Injection  Tezspire (Tezepelumab), Dose: 210 mg, Site: subcutaneous, Number of injections: 1  Injection Site(s): Right arm  Post Care: Patient declined observation  Discharge: Condition: Good, Destination: Home . AVS Declined  Performed by:  Loney Hering, LPN

## 2023-10-27 ENCOUNTER — Encounter: Attending: Surgery | Admitting: Skilled Nursing Facility1

## 2023-10-27 ENCOUNTER — Encounter: Payer: Self-pay | Admitting: Skilled Nursing Facility1

## 2023-10-27 DIAGNOSIS — Z6841 Body Mass Index (BMI) 40.0 and over, adult: Secondary | ICD-10-CM | POA: Insufficient documentation

## 2023-10-27 DIAGNOSIS — Z713 Dietary counseling and surveillance: Secondary | ICD-10-CM | POA: Diagnosis not present

## 2023-10-27 NOTE — Progress Notes (Signed)
 Pre-Operative Nutrition Class:    Patient was seen on 10/27/2023 for Pre-Operative Bariatric Surgery Education at [facility]    Surgery date:  Surgery type: RYGB Start weight at [facility]: 323.9 Weight today: 314  Samples given per MNT protocol. Patient educated on appropriate usage: Samples given per MNT protocol. Patient educated on appropriate usage: Ensure Max Lot # [id number] Exp: 06/27/2024   Celebrate multi Lot # [id number] Exp: 01/26  The following the learning objectives were met by the patient during this course: Identify Pre-Op Dietary Goals and will begin 2 weeks pre-operatively Identify appropriate sources of fluids and proteins  State protein recommendations and appropriate sources pre and post-operatively Identify Post-Operative Dietary Goals and will follow for 2 weeks post-operatively Identify appropriate multivitamin and calcium  sources Describe the need for physical activity post-operatively and will follow MD recommendations State when to call healthcare provider regarding medication questions or post-operative complications When having a diagnosis of diabetes understanding hypoglycemia symptoms and the inclusion of 1 complex carbohydrate per meal  Handouts given during class include: Pre-Op Bariatric Surgery Diet Handout Protein Shake Handout Post-Op Bariatric Surgery Nutrition Handout BELT Program Information Flyer Support Group Information Flyer [facility] Bariatric Supplements Price List  Follow-Up Plan: Patient will follow-up at [facility] 2 weeks post operatively for diet advancement per MD.

## 2023-10-29 ENCOUNTER — Ambulatory Visit: Payer: Self-pay | Admitting: Surgery

## 2023-10-29 NOTE — H&P (Signed)
 April [name] [id number]   Referring Provider:  Self   Subjective   Chief Complaint: Follow-up (Pre-Op for surgery date of 11/17/2023, LGB & Upper GI/)     History of Present Illness: Upper GI negative Cleared by dietitian Labs reviewed-elevated cholesterol and triglycerides, mildly low vitamin D , folate also low at 2.4, hemoglobin A1c 5.8, H. pylori negative Cleared by pulmonologist (Dr. Reggy Salt) and cardiology (Dr. Ronal Cluck); Her RCRI is a score of 1, 6% of a major cardiac event. She is acceptable cardiac risk for bariatric surgery.   She denies any major health changes since her last meeting which was about 7 months ago.  She has had to go to the emergency room for asthma exacerbations.  04/10/23: Very pleasant 53 year old woman with history of vitamin D  deficiency, tobacco use (quit in 2016), COPD, asthma, rheumatoid arthritis, steroid-induced diabetes, obstructive sleep apnea no longer on CPAP, arthritis, hypertension, history of H. pylori, history of lower extremity edema, history of menorrhagia, GERD, depression and anxiety, and morbid obesity who presents for consultation regarding surgical treatment of the latter.  Previous abdominal surgery includes tubal ligation.  Medication list does include Eliquis , Lasix , prednisone  (when hospitalized for respiratory issues), semaglutide , tezepelumab .   Multiple hospitalizations for asthma exacerbation/COPD including a prolonged hospitalization in 2019 requiring tracheostomy and most recently in September of this year requiring Solu-Medrol  and steroid taper at discharge.  Pulmonologist is Dr. Salt.  She does not see a cardiologist.  She has been considering bariatric surgery for many years.  She was working with a clinic in [place] but had to abandon that pathway as she was having issues with her respiratory illness.  She unfortunately lost her daughter in August of this year which was obviously a very stressful event, but  following this she has reignited her goals of improving her health so that she can be there for her grandchildren.  She is interested in Roux-en-Y gastric bypass.    Review of Systems: A complete review of systems was obtained from the patient.  I have reviewed this information and discussed as appropriate with the patient.  See HPI as well for other ROS.   Medical History: Past Medical History:  Diagnosis Date   Asthma without status asthmaticus (HHS-HCC)    Atrial fibrillation (CMS/HHS-HCC)    COPD (chronic obstructive pulmonary disease) (CMS/HHS-HCC)    GERD (gastroesophageal reflux disease)    Hypertension     Patient Active Problem List  Diagnosis   ARDS (adult respiratory distress syndrome) (CMS/HHS-HCC)   AKI (acute kidney injury) ()   Shock (CMS/HHS-HCC)   Altered mental status, unspecified   Hypoxia   Paroxysmal atrial fibrillation (CMS/HHS-HCC)   COPD (chronic obstructive pulmonary disease) (CMS/HHS-HCC)   Influenza A   OSA on CPAP   Morbid obesity with BMI of 50.0-59.9 (HCC)   Debility    Past Surgical History:  Procedure Laterality Date   TRACHEOSTOMY ADULT N/A 08/21/2017   Procedure: TRACHEOSTOMY, PLANNED (SEPARATE PROCEDURE);  Surgeon: Murlean Lang Fess, MD;  Location: [facility]PERATING ROOMS;  Service: Cardiothoracic;  Laterality: N/A;     Allergies  Allergen Reactions   Iodinated Contrast Media Itching   Oxycodone -Acetaminophen  Itching    Current Outpatient Medications on File Prior to Visit  Medication Sig Dispense Refill   apixaban  (ELIQUIS ) 5 mg tablet Take 1 tablet (5 mg total) by mouth every 12 (twelve) hours 60 tablet 1   calcium  carbonate (TUMS E-X) 300 mg (750 mg) chewable tablet Take 1 tablet (750  mg of salt total) by mouth 3 (three) times daily as needed  0   melatonin 3 mg tablet Take 3 tablets (9 mg total) by mouth nightly 30 tablet 0   methyl salicylate-menthol  15-10 % cream Apply topically 4 (four) times daily as needed (muscle  cramps/pain)  0   metoprolol  tartrate (LOPRESSOR ) 100 MG tablet Take 1 tablet (100 mg total) by mouth 2 (two) times daily 60 tablet 1   mometasone -formoterol  (DULERA ) 200-5 mcg/actuation inhaler Inhale 2 inhalations into the lungs 2 (two) times daily 1 Inhaler 1   montelukast  (SINGULAIR ) 10 mg tablet Take 1 tablet (10 mg total) by mouth nightly 30 tablet 1   potassium chloride  (K-TAB ) 20 mEq TbER ER tablet Take 1 tablet (20 mEq total) by mouth once daily 30 tablet 1   sertraline  (ZOLOFT ) 100 MG tablet Take 1 tablet (100 mg total) by mouth once daily 30 tablet 1   albuterol  90 mcg/actuation inhaler Inhale 2 inhalations into the lungs 4 (four) times daily as needed 8.5 g 1   amLODIPine  (NORVASC ) 5 MG tablet Take 1 tablet (5 mg total) by mouth once daily 30 tablet 1   budesonide -formoterol  (SYMBICORT ) 160-4.5 mcg/actuation inhaler Inhale 2 inhalations into the lungs 2 (two) times daily 1 Inhaler 1   FUROsemide  (LASIX ) 40 MG tablet Take 1 tablet (40 mg total) by mouth once daily 30 tablet 1   ipratropium (ATROVENT  HFA) inhaler Inhale 2 inhalations into the lungs 4 (four) times daily as needed for Wheezing 1 Inhaler 1   traZODone  (DESYREL ) 100 MG tablet Take 1 tablet (100 mg total) by mouth nightly as needed for Sleep 30 tablet 1   No current facility-administered medications on file prior to visit.    Family History  Problem Relation Age of Onset   High blood pressure (Hypertension) Mother      Social History   Tobacco Use  Smoking Status Smoker, Current Status Unknown  Smokeless Tobacco Not on file     Social History   Socioeconomic History   Marital status: Single  Tobacco Use   Smoking status: Smoker, Current Status Unknown  Vaping Use   Vaping status: Unknown   Social Drivers of Health   Financial Resource Strain: Medium Risk (11/20/2022)   Received from [facility]   Overall Financial Resource Strain (CARDIA)    Difficulty of Paying Living Expenses: Somewhat hard  Food  Insecurity: No Food Insecurity (01/29/2023)   Received from [facility]   Hunger Vital Sign    Within the past 12 months, you worried that your food would run out before you got the money to buy more.: Never true    Within the past 12 months, the food you bought just didn't last and you didn't have money to get more.: Never true  Transportation Needs: No Transportation Needs (01/29/2023)   Received from [facility] - Transportation    Lack of Transportation (Medical): No    Lack of Transportation (Non-Medical): No  Physical Activity: Insufficiently Active (11/20/2022)   Received from [facility]   Exercise Vital Sign    On average, how many days per week do you engage in moderate to strenuous exercise (like a brisk walk)?: 3 days    On average, how many minutes do you engage in exercise at this level?: 30 min  Stress: Stress Concern Present (11/20/2022)   Received from [facility] - Occupational Stress Questionnaire    Feeling of Stress :  To some extent  Social Connections: Socially Isolated (11/20/2022)   Received from [facility]   Social Connection and Isolation Panel    In a typical week, how many times do you talk on the phone with family, friends, or neighbors?: More than three times a week    How often do you get together with friends or relatives?: Three times a week    How often do you attend church or religious services?: Never    Do you belong to any clubs or organizations such as church groups, unions, fraternal or athletic groups, or school groups?: No    How often do you attend meetings of the clubs or organizations you belong to?: Never    Are you married, widowed, divorced, separated, never married, or living with a partner?: Never married  Housing Stability: Unknown (10/29/2023)   Housing Stability Vital Sign    Homeless in the Last Year: No    Objective:    Vitals:   10/29/23 1519  BP: (!) 145/84  Pulse: 94  Temp:  36.7 C (98 F)  SpO2: 97%  Weight: (!) 145.2 kg (320 lb)  Height: 165.1 cm (5' 5)  PainSc: 0-No pain     Body mass index is 53.25 kg/m.  Gen: A&Ox3, no distress  Unlabored respirations   Labs, Imaging and Diagnostic Testing: Labs recently include CMP and CBC on 9/29 notable for potassium of 3.3, albumin  of 3.1, WBC 15.4, hemoglobin 10.8 but otherwise unremarkable;  Chest x-ray unremarkable on 01/26/23 EKG on 9/26 normal sinus rhythm, left anterior fascicular block, probable old anteroseptal infarct, left atrial enlargement Echo 12/06/2022 with EF 60 to 65%, normal left ventricular function and no regional wall motion abnormalities, mild concentric left ventricular hypertrophy, grade 1 diastolic dysfunction.  Normal right ventricular systolic function cannot assess PA pressure. Assessment and Plan:  Diagnoses and all orders for this visit:  Morbid obesity (CMS/HHS-HCC)  Major depressive disorder with current active episode, unspecified depression episode severity, unspecified whether recurrent  Essential hypertension  COPD with asthma (CMS/HHS-HCC)  Paroxysmal atrial fibrillation (CMS/HHS-HCC)  Obstructive sleep apnea on CPAP  Steroid-induced diabetes mellitus, sequela (CMS/HHS-HCC)      We went over both Roux-en-Y gastric bypass as well as sleeve gastrectomy and discussed the relevant anatomy, surgical technique, and typical recovery timelines for both.  We discussed risks including bleeding, infection, pain, scarring, injury to intra-abdominal structures, arrhythmias/ MI or other acute cardiac complications, pneumonia/exacerbation of her existing asthma/COPD and other pulmonary complications, DVT/PE, stroke, staple line/anastomotic leak or abscess, stricture or obstruction, as well as risk of marginal ulcer, internal hernia, malnutrition/nutrient deficiency in the setting of bypass as well as risk of worsened GERD in the setting of sleeve gastrectomy.  Discussed that her risk  of perioperative complications is increased due to her pre-existing comorbidities.  Discussed failure to meet weight loss goals as well as weight regain and emphasized the importance of lifelong behavioral change to combat the chronic and incurable disease of obesity.  Questions welcomed and answered to her satisfaction.  We will initiate the pathway towards Roux-en-Y gastric bypass after complete discussion of all the risks, benefits and alternatives.  Will request clearance from her pulmonologist although based on most recent notes sounds like this would be favored.  She does not see a cardiologist and I think would be reasonable to establish care given history of A-fib on Eliquis  and documented history of CHF although most recent echo reassuring.  She has met and cleared all of the requirements  for bariatric surgery and remains a candidate for Roux-en-Y gastric bypass.  We reviewed the above and discussed that she is high risk of complications given her medical history.  We reviewed the perioperative and postoperative course and possible complications.  Questions were welcomed and answered to her satisfaction.   April [name] April FREUND, MD

## 2023-10-29 NOTE — H&P (View-Only) (Signed)
 April [name] Ar [id number]   Referring Provider:  Self   Subjective   Chief Complaint: Follow-up (Pre-Op for surgery date of 11/17/2023, LGB & Upper GI/)     History of Present Illness: Upper GI negative Cleared by dietitian Labs reviewed-elevated cholesterol and triglycerides, mildly low vitamin D , folate also low at 2.4, hemoglobin A1c 5.8, H. pylori negative Cleared by pulmonologist (Dr. Reggy Salt) and cardiology (Dr. Ronal Cluck); Her RCRI is a score of 1, 6% of a major cardiac event. She is acceptable cardiac risk for bariatric surgery.   She denies any major health changes since her last meeting which was about 7 months ago.  She has had to go to the emergency room for asthma exacerbations.  04/10/23: Very pleasant 53 year old woman with history of vitamin D  deficiency, tobacco use (quit in 2016), COPD, asthma, rheumatoid arthritis, steroid-induced diabetes, obstructive sleep apnea no longer on CPAP, arthritis, hypertension, history of H. pylori, history of lower extremity edema, history of menorrhagia, GERD, depression and anxiety, and morbid obesity who presents for consultation regarding surgical treatment of the latter.  Previous abdominal surgery includes tubal ligation.  Medication list does include Eliquis , Lasix , prednisone  (when hospitalized for respiratory issues), semaglutide , tezepelumab .   Multiple hospitalizations for asthma exacerbation/COPD including a prolonged hospitalization in 2019 requiring tracheostomy and most recently in September of this year requiring Solu-Medrol  and steroid taper at discharge.  Pulmonologist is Dr. Salt.  She does not see a cardiologist.  She has been considering bariatric surgery for many years.  She was working with a clinic in [place] but had to abandon that pathway as she was having issues with her respiratory illness.  She unfortunately lost her daughter in August of this year which was obviously a very stressful event, but  following this she has reignited her goals of improving her health so that she can be there for her grandchildren.  She is interested in Roux-en-Y gastric bypass.    Review of Systems: A complete review of systems was obtained from the patient.  I have reviewed this information and discussed as appropriate with the patient.  See HPI as well for other ROS.   Medical History: Past Medical History:  Diagnosis Date   Asthma without status asthmaticus (HHS-HCC)    Atrial fibrillation (CMS/HHS-HCC)    COPD (chronic obstructive pulmonary disease) (CMS/HHS-HCC)    GERD (gastroesophageal reflux disease)    Hypertension     Patient Active Problem List  Diagnosis   ARDS (adult respiratory distress syndrome) (CMS/HHS-HCC)   AKI (acute kidney injury) ()   Shock (CMS/HHS-HCC)   Altered mental status, unspecified   Hypoxia   Paroxysmal atrial fibrillation (CMS/HHS-HCC)   COPD (chronic obstructive pulmonary disease) (CMS/HHS-HCC)   Influenza A   OSA on CPAP   Morbid obesity with BMI of 50.0-59.9 (HCC)   Debility    Past Surgical History:  Procedure Laterality Date   TRACHEOSTOMY ADULT N/A 08/21/2017   Procedure: TRACHEOSTOMY, PLANNED (SEPARATE PROCEDURE);  Surgeon: Murlean Lang Fess, MD;  Location: [facility];  Service: Cardiothoracic;  Laterality: N/A;     Allergies  Allergen Reactions   Iodinated Contrast Media Itching   Oxycodone -Acetaminophen  Itching    Current Outpatient Medications on File Prior to Visit  Medication Sig Dispense Refill   apixaban  (ELIQUIS ) 5 mg tablet Take 1 tablet (5 mg total) by mouth every 12 (twelve) hours 60 tablet 1   calcium  carbonate (TUMS E-X) 300 mg (750 mg) chewable tablet Take 1 tablet (750  mg of salt total) by mouth 3 (three) times daily as needed  0   melatonin 3 mg tablet Take 3 tablets (9 mg total) by mouth nightly 30 tablet 0   methyl salicylate-menthol  15-10 % cream Apply topically 4 (four) times daily as needed (muscle  cramps/pain)  0   metoprolol  tartrate (LOPRESSOR ) 100 MG tablet Take 1 tablet (100 mg total) by mouth 2 (two) times daily 60 tablet 1   mometasone -formoterol  (DULERA ) 200-5 mcg/actuation inhaler Inhale 2 inhalations into the lungs 2 (two) times daily 1 Inhaler 1   montelukast  (SINGULAIR ) 10 mg tablet Take 1 tablet (10 mg total) by mouth nightly 30 tablet 1   potassium chloride  (K-TAB ) 20 mEq TbER ER tablet Take 1 tablet (20 mEq total) by mouth once daily 30 tablet 1   sertraline  (ZOLOFT ) 100 MG tablet Take 1 tablet (100 mg total) by mouth once daily 30 tablet 1   albuterol  90 mcg/actuation inhaler Inhale 2 inhalations into the lungs 4 (four) times daily as needed 8.5 g 1   amLODIPine  (NORVASC ) 5 MG tablet Take 1 tablet (5 mg total) by mouth once daily 30 tablet 1   budesonide -formoterol  (SYMBICORT ) 160-4.5 mcg/actuation inhaler Inhale 2 inhalations into the lungs 2 (two) times daily 1 Inhaler 1   FUROsemide  (LASIX ) 40 MG tablet Take 1 tablet (40 mg total) by mouth once daily 30 tablet 1   ipratropium (ATROVENT  HFA) inhaler Inhale 2 inhalations into the lungs 4 (four) times daily as needed for Wheezing 1 Inhaler 1   traZODone  (DESYREL ) 100 MG tablet Take 1 tablet (100 mg total) by mouth nightly as needed for Sleep 30 tablet 1   No current facility-administered medications on file prior to visit.    Family History  Problem Relation Age of Onset   High blood pressure (Hypertension) Mother      Social History   Tobacco Use  Smoking Status Smoker, Current Status Unknown  Smokeless Tobacco Not on file     Social History   Socioeconomic History   Marital status: Single  Tobacco Use   Smoking status: Smoker, Current Status Unknown  Vaping Use   Vaping status: Unknown   Social Drivers of Health   Financial Resource Strain: Medium Risk (11/20/2022)   Received from [facility]   Overall Financial Resource Strain (CARDIA)    Difficulty of Paying Living Expenses: Somewhat hard  Food  Insecurity: No Food Insecurity (01/29/2023)   Received from [facility]   Hunger Vital Sign    Within the past 12 months, you worried that your food would run out before you got the money to buy more.: Never true    Within the past 12 months, the food you bought just didn't last and you didn't have money to get more.: Never true  Transportation Needs: No Transportation Needs (01/29/2023)   Received from [facility] - Transportation    Lack of Transportation (Medical): No    Lack of Transportation (Non-Medical): No  Physical Activity: Insufficiently Active (11/20/2022)   Received from [facility]   Exercise Vital Sign    On average, how many days per week do you engage in moderate to strenuous exercise (like a brisk walk)?: 3 days    On average, how many minutes do you engage in exercise at this level?: 30 min  Stress: Stress Concern Present (11/20/2022)   Received from [facility] - Occupational Stress Questionnaire    Feeling of Stress :  To some extent  Social Connections: Socially Isolated (11/20/2022)   Received from [facility]   Social Connection and Isolation Panel    In a typical week, how many times do you talk on the phone with family, friends, or neighbors?: More than three times a week    How often do you get together with friends or relatives?: Three times a week    How often do you attend church or religious services?: Never    Do you belong to any clubs or organizations such as church groups, unions, fraternal or athletic groups, or school groups?: No    How often do you attend meetings of the clubs or organizations you belong to?: Never    Are you married, widowed, divorced, separated, never married, or living with a partner?: Never married  Housing Stability: Unknown (10/29/2023)   Housing Stability Vital Sign    Homeless in the Last Year: No    Objective:    Vitals:   10/29/23 1519  BP: (!) 145/84  Pulse: 94  Temp:  36.7 C (98 F)  SpO2: 97%  Weight: (!) 145.2 kg (320 lb)  Height: 165.1 cm (5' 5)  PainSc: 0-No pain     Body mass index is 53.25 kg/m.  Gen: A&Ox3, no distress  Unlabored respirations   Labs, Imaging and Diagnostic Testing: Labs recently include CMP and CBC on 9/29 notable for potassium of 3.3, albumin  of 3.1, WBC 15.4, hemoglobin 10.8 but otherwise unremarkable;  Chest x-ray unremarkable on 01/26/23 EKG on 9/26 normal sinus rhythm, left anterior fascicular block, probable old anteroseptal infarct, left atrial enlargement Echo 12/06/2022 with EF 60 to 65%, normal left ventricular function and no regional wall motion abnormalities, mild concentric left ventricular hypertrophy, grade 1 diastolic dysfunction.  Normal right ventricular systolic function cannot assess PA pressure. Assessment and Plan:  Diagnoses and all orders for this visit:  Morbid obesity (CMS/HHS-HCC)  Major depressive disorder with current active episode, unspecified depression episode severity, unspecified whether recurrent  Essential hypertension  COPD with asthma (CMS/HHS-HCC)  Paroxysmal atrial fibrillation (CMS/HHS-HCC)  Obstructive sleep apnea on CPAP  Steroid-induced diabetes mellitus, sequela (CMS/HHS-HCC)      We went over both Roux-en-Y gastric bypass as well as sleeve gastrectomy and discussed the relevant anatomy, surgical technique, and typical recovery timelines for both.  We discussed risks including bleeding, infection, pain, scarring, injury to intra-abdominal structures, arrhythmias/ MI or other acute cardiac complications, pneumonia/exacerbation of her existing asthma/COPD and other pulmonary complications, DVT/PE, stroke, staple line/anastomotic leak or abscess, stricture or obstruction, as well as risk of marginal ulcer, internal hernia, malnutrition/nutrient deficiency in the setting of bypass as well as risk of worsened GERD in the setting of sleeve gastrectomy.  Discussed that her risk  of perioperative complications is increased due to her pre-existing comorbidities.  Discussed failure to meet weight loss goals as well as weight regain and emphasized the importance of lifelong behavioral change to combat the chronic and incurable disease of obesity.  Questions welcomed and answered to her satisfaction.  We will initiate the pathway towards Roux-en-Y gastric bypass after complete discussion of all the risks, benefits and alternatives.  Will request clearance from her pulmonologist although based on most recent notes sounds like this would be favored.  She does not see a cardiologist and I think would be reasonable to establish care given history of A-fib on Eliquis  and documented history of CHF although most recent echo reassuring.  She has met and cleared all of the requirements  for bariatric surgery and remains a candidate for Roux-en-Y gastric bypass.  We reviewed the above and discussed that she is high risk of complications given her medical history.  We reviewed the perioperative and postoperative course and possible complications.  Questions were welcomed and answered to her satisfaction.   Jane Birkel ALAN FREUND, MD

## 2023-11-03 ENCOUNTER — Telehealth: Payer: Self-pay | Admitting: Family Medicine

## 2023-11-03 NOTE — Telephone Encounter (Signed)
 Copied from CRM [id number]. Topic: General - Other >> Nov 03, 2023 11:09 AM Zebedee SAUNDERS wrote:  Reason for CRM: Pt would like a call back regarding surgery, pt would not elaborate. Please call at [phone]

## 2023-11-03 NOTE — Telephone Encounter (Signed)
 Called patient and she requests a call back tomorrow.

## 2023-11-04 ENCOUNTER — Telehealth: Payer: Self-pay

## 2023-11-04 NOTE — Telephone Encounter (Signed)
 Message received that the patient wanted to speak to me.   I called the patient and she explained that she will be having gastric bypass surgery on 11/17/2023 and she is very anxious about the procedure and how she will be able to manage at home alone after the surgery. She said she doesn't have anyone that can stay with her and she thinks she needs to go to a rehab facility after the surgery. She said that the surgeon's office told her that she will need to have some help in place when she returns home. She said she will be staying at least one night in the hospital.   The patient explained that she thinks a rehab facility for a few weeks will be the best option because she is very anxious about being at hom alone.  I told her that the insurance company will not pay for someone to just sit with her and/or do homemaking chores. She said she understands that.  I told her that I will check with one of the liaisons for local SNF for recommendations on how to proceed and the patient was in agreement.  I spoke to Madelin Kohler, RN- [facility] and explained patient's concerns/request for rehab placement after her surgery.  Tammy said that the patient would need to be evaluated after her surgery as we do not know what her needs will be until then.  She said that the patient will not receive assistance for someone just to sit with her. She also said that the the patient can be evaluated by PT while in the hospital and if in- patient rehab is recommended, she can be placed in a facility from the hospital.   If she ends up going home and then feels she is not able to care for herself, Tammy said she will come out to the house and evaluate the patient, and if rehab seems appropriate that time, she can submit the request to the insurance company.   I called the patient back and explained what Tammy told me and the patient said she understood.  I asked the patient if she still receives PCS and she said that  was stopped.  She said she is not sure if she still has Medicaid. I told her I would check on that. I also explained that if she has Medicaid the hospital can submit an expedited [facility] request.

## 2023-11-06 ENCOUNTER — Other Ambulatory Visit: Payer: Self-pay

## 2023-11-06 NOTE — Progress Notes (Signed)
 Specialty Pharmacy Refill Coordination Note  April [name] is a 53 y.o. female contacted today regarding refills of specialty medication(s) Tezepelumab -ekko (Tezspire )   Patient requested Courier to Provider Office   Delivery date: 11/12/23   Verified address: [employer]  [address]   Medication will be filled on 11/11/23.

## 2023-11-07 ENCOUNTER — Telehealth: Payer: Self-pay | Admitting: Orthopaedic Surgery

## 2023-11-07 NOTE — Telephone Encounter (Signed)
 Pt called requesting bil knee gel injection. Pt phone number is [phone].

## 2023-11-10 ENCOUNTER — Telehealth: Payer: Self-pay | Admitting: Skilled Nursing Facility1

## 2023-11-10 ENCOUNTER — Telehealth: Payer: Self-pay | Admitting: Family Medicine

## 2023-11-10 DIAGNOSIS — F419 Anxiety disorder, unspecified: Secondary | ICD-10-CM

## 2023-11-10 NOTE — Telephone Encounter (Signed)
 Pt called asking about the pureed and liquid diet before surgery.   Dietitian reviewed the diet requirements pre and post surgery.  Pt also asked what vitamins regimen she is supposed to do.   Pt states she bought a blender and is ready for the clear liquid diet.  Dietitian advised she review her materials from class in addition to the information from this phone call.

## 2023-11-10 NOTE — Addendum Note (Signed)
 Addended by: Rowin Bayron on: 11/10/2023 03:42 PM   Modules accepted: Orders

## 2023-11-10 NOTE — Telephone Encounter (Signed)
 Copied from CRM [id number]. Topic: Appointments - Appointment Cancel/Reschedule >> Nov 10, 2023 12:29 PM Rosaria BRAVO wrote:  Patient/patient representative is calling to cancel or reschedule an appointment. Refer to attachments for appointment information.   Pt called to inquire about her appt on Monday. She says she has surgery that morning and wants to know if she can still keep her appt.   Requesting a call back from the clinic   Best contact: [phone]

## 2023-11-10 NOTE — Telephone Encounter (Signed)
 Called patient, patient appointment has been rescheduled to the next available appointment date. 12/15/2023 at 3:10 pm, patient acknowledged appointment.   Slater patient has surgery on 11/17/2023 and she wanted to know if you'll be available to see her and check in during her visit.

## 2023-11-10 NOTE — Telephone Encounter (Addendum)
 I spoke to the patient and she explained that she is having her gastric bypass surgery next week and she is very anxious.  She is concerned how she will manage at home by herself after the procedure and would like to go to rehab for a short time after discharge from the hospital.  I explained that we cannot make a referral for rehab at this time.  While she is in the hospital, they can have PT evaluate her and if rehab is recommended, the hospital staff will research placement options.  If they feel she is safe to go home, then home health PT is a possible option and if she needs home PT, she may also be eligible for a home health aide.  She said she understood but said she is still very anxious.   I commended her for coming this far with preparing for this procedure and she said she really wants it to be a success.  I offered to make a referral to [facility] for LCSW assistance to help her cope with her anxiety and she was in agreement.    [facility] referral placed as discussed   While speaking with the patient I informed her that we checked the status of her Medicaid and she only has [place] Medicaid that pays her Medicare premium, not full Medicaid and she said she understood

## 2023-11-11 ENCOUNTER — Telehealth: Payer: Self-pay | Admitting: *Deleted

## 2023-11-11 ENCOUNTER — Other Ambulatory Visit: Payer: Self-pay

## 2023-11-11 NOTE — Progress Notes (Signed)
 Complex Care Management Note  Care Guide Note 11/11/2023 Name: April [name] MRN: [id number] DOB: 11/30/1970  [name] is a 53 y.o. year old female who sees Delbert Clam, MD for primary care. I reached out to [name] by phone today to offer complex care management services.  [name] was given information about Complex Care Management services today including:   The Complex Care Management services include support from the care team which includes your Nurse Care Manager, Clinical Social Worker, or Pharmacist.  The Complex Care Management team is here to help remove barriers to the health concerns and goals most important to you. Complex Care Management services are voluntary, and the patient may decline or stop services at any time by request to their care team member.   Complex Care Management Consent Status: Patient agreed to services and verbal consent obtained.   Follow up plan:  Telephone appointment with complex care management team member scheduled for:  11/12/23  Encounter Outcome:  Patient Scheduled

## 2023-11-12 ENCOUNTER — Other Ambulatory Visit: Payer: Self-pay

## 2023-11-12 NOTE — Patient Outreach (Signed)
 LCSW called patient at scheduled time however patient did not answer. Due to referral being emergent, LCSW called back and reached patient. LCSW spoke with patient and rescheduled initial assessments for 11/13/2023 at 11:30 am. Patient reports emotional state was stable for now however had anxiety about her situation after surgery.  Olam Ally, MSW, LCSW [place]  [facility], [facility] Licensed Clinical Social Worker Direct Dial: [phone]

## 2023-11-12 NOTE — Telephone Encounter (Signed)
 Next available gel injection would need to be after 01/08/2024 for bilateral knee.  Will submit in August, 2025.

## 2023-11-13 ENCOUNTER — Other Ambulatory Visit: Payer: Self-pay

## 2023-11-13 NOTE — Patient Instructions (Signed)
 SURGICAL WAITING ROOM VISITATION Patients having surgery or a procedure may have no more than 2 support people in the waiting area - these visitors may rotate in the visitor waiting room.   If the patient needs to stay at the hospital during part of their recovery, the visitor guidelines for inpatient rooms apply.  PRE-OP VISITATION  Pre-op nurse will coordinate an appropriate time for 1 support person to accompany the patient in pre-op.  This support person may not rotate.  This visitor will be contacted when the time is appropriate for the visitor to come back in the pre-op area.  Please refer to the [facility] website for the visitor guidelines for Inpatients (after your surgery is over and you are in a regular room).  You are not required to quarantine at this time prior to your surgery. However, you must do this: Hand Hygiene often Do NOT share personal items Notify your provider if you are in close contact with someone who has COVID or you develop fever 100.4 or greater, new onset of sneezing, cough, sore throat, shortness of breath or body aches.  If you test positive for Covid or have been in contact with anyone that has tested positive in the last 10 days please notify you surgeon.    Your procedure is scheduled on:  MONDAY  November 17, 2023  Report to [facility] Main Entrance: [name].   Report to admitting at: 05:15    AM  Call this number if you have any questions or problems the morning of surgery [phone]  FOLLOW ANY ADDITIONAL PRE OP INSTRUCTIONS YOU RECEIVED FROM YOUR SURGEON'S OFFICE!!!  Do not eat food after Midnight the night prior to your surgery/procedure.  After Midnight you may have the following liquids until   04:30 AM DAY OF SURGERY  Clear Liquid Diet Water  Black Coffee (sugar ok, NO MILK/CREAM OR CREAMERS)  Tea (sugar ok, NO MILK/CREAM OR CREAMERS) regular and decaf                              Plain Jell-O  with no fruit (NO RED)                                           Fruit ices (not with fruit pulp, NO RED)                                     Popsicles (NO RED)                                                                  Juice: NO CITRUS JUICES: only apple, WHITE grape, WHITE cranberry Sports drinks like Gatorade or Powerade (NO RED)                   Oral Hygiene is also important to reduce your risk of infection.        Remember - BRUSH YOUR TEETH THE MORNING OF SURGERY WITH YOUR REGULAR TOOTHPASTE  Do NOT smoke after Midnight the night before surgery.  RYBELSUS -  Stop taking 1 day before your surgery. Last day to take will be: 11-15-2023  Take ONLY these medicines the morning of surgery with A SIP OF WATER : Levothyroxine , Clonidine , Venlafaxine , Gabapentin . You may use your Flonase  nasal spray and your Inhalers / nebulizer as needed. Please bring your Albuterol  inhaler with you on the day of surgery.  You may take Percocet if needed for pain.   You may not have any metal on your body including hair pins, jewelry, and body piercing  Do not wear make-up, lotions, powders, perfumes or deodorant  Do not wear nail polish including gel and S&S, artificial / acrylic nails, or any other type of covering on natural nails including finger and toenails. If you have artificial nails, gel coating, etc., that needs to be removed by a nail salon, Please have this removed prior to surgery. Not doing so may mean that your surgery could be cancelled or delayed if the Surgeon or anesthesia staff feels like they are unable to monitor you safely.   Do not shave 48 hours prior to surgery to avoid nicks in your skin which may contribute to postoperative infections.   Contacts, Hearing Aids, dentures or bridgework may not be worn into surgery. DENTURES WILL BE REMOVED PRIOR TO SURGERY PLEASE DO NOT APPLY Poly grip OR ADHESIVES!!!  You may bring a small overnight bag with you on the day  of surgery, only pack items that are not valuable. [name]   FOR VALUABLES THAT ARE LOST OR STOLEN.   Do not bring your home medications to the hospital. The Pharmacy will dispense medications listed on your medication list to you during your admission in the Hospital.  Please read over the following fact sheets you were given: IF YOU HAVE QUESTIONS ABOUT YOUR PRE-OP INSTRUCTIONS, PLEASE CALL [phone]   [facility] - Preparing for Surgery Before surgery, you can play an important role.  Because skin is not sterile, your skin needs to be as free of germs as possible.  You can reduce the number of germs on your skin by washing with CHG (chlorahexidine gluconate) soap before surgery.  CHG is an antiseptic cleaner which kills germs and bonds with the skin to continue killing germs even after washing. Please DO NOT use if you have an allergy  to CHG or antibacterial soaps.  If your skin becomes reddened/irritated stop using the CHG and inform your nurse when you arrive at Short Stay. Do not shave (including legs and underarms) for at least 48 hours prior to the first CHG shower.  You may shave your face/neck.  Please follow these instructions carefully:  1.  Shower with CHG Soap the night before surgery and the  morning of surgery.  2.  If you choose to wash your hair, wash your hair first as usual with your normal  shampoo.  3.  After you shampoo, rinse your hair and body thoroughly to remove the shampoo.                             4.  Use CHG as you would any other liquid soap.  You can apply chg directly to the skin and wash.  Gently with a scrungie or clean washcloth.  5.  Apply the CHG Soap to your body ONLY FROM THE NECK DOWN.   Do not use on face/ open  Wound or open sores. Avoid contact with eyes, ears mouth and genitals (private parts).                       Wash face,  Genitals (private parts) with your normal soap.             6.  Wash  thoroughly, paying special attention to the area where your  surgery  will be performed.  7.  Thoroughly rinse your body with warm water  from the neck down.  8.  DO NOT shower/wash with your normal soap after using and rinsing off the CHG Soap.            9.  Pat yourself dry with a clean towel.            10.  Wear clean pajamas.            11.  Place clean sheets on your bed the night of your first shower and do not  sleep with pets.  ON THE DAY OF SURGERY : Do not apply any lotions/deodorants the morning of surgery.  Please wear clean clothes to the hospital/surgery center.     FAILURE TO FOLLOW THESE INSTRUCTIONS MAY RESULT IN THE CANCELLATION OF YOUR SURGERY  PATIENT SIGNATURE_________________________________  NURSE SIGNATURE__________________________________  ________________________________________________________________________      April [name]    An incentive spirometer is a tool that can help keep your lungs clear and active. This tool measures how well you are filling your lungs with each breath. Taking long deep breaths may help reverse or decrease the chance of developing breathing (pulmonary) problems (especially infection) following: A long period of time when you are unable to move or be active. BEFORE THE PROCEDURE  If the spirometer includes an indicator to show your best effort, your nurse or respiratory therapist will set it to a desired goal. If possible, sit up straight or lean slightly forward. Try not to slouch. Hold the incentive spirometer in an upright position. INSTRUCTIONS FOR USE  Sit on the edge of your bed if possible, or sit up as far as you can in bed or on a chair. Hold the incentive spirometer in an upright position. Breathe out normally. Place the mouthpiece in your mouth and seal your lips tightly around it. Breathe in slowly and as deeply as possible, raising the piston or the ball toward the top of the column. Hold your breath  for 3-5 seconds or for as long as possible. Allow the piston or ball to fall to the bottom of the column. Remove the mouthpiece from your mouth and breathe out normally. Rest for a few seconds and repeat Steps 1 through 7 at least 10 times every 1-2 hours when you are awake. Take your time and take a few normal breaths between deep breaths. The spirometer may include an indicator to show your best effort. Use the indicator as a goal to work toward during each repetition. After each set of 10 deep breaths, practice coughing to be sure your lungs are clear. If you have an incision (the cut made at the time of surgery), support your incision when coughing by placing a pillow or rolled up towels firmly against it. Once you are able to get out of bed, walk around indoors and cough well. You may stop using the incentive spirometer when instructed by your caregiver.  RISKS AND COMPLICATIONS Take your time so you do not get dizzy or light-headed. If you are  in pain, you may need to take or ask for pain medication before doing incentive spirometry. It is harder to take a deep breath if you are having pain. AFTER USE Rest and breathe slowly and easily. It can be helpful to keep track of a log of your progress. Your caregiver can provide you with a simple table to help with this. If you are using the spirometer at home, follow these instructions: SEEK MEDICAL CARE IF:  You are having difficultly using the spirometer. You have trouble using the spirometer as often as instructed. Your pain medication is not giving enough relief while using the spirometer. You develop fever of 100.5 F (38.1 C) or higher.                                                                                                    SEEK IMMEDIATE MEDICAL CARE IF:  You cough up bloody sputum that had not been present before. You develop fever of 102 F (38.9 C) or greater. You develop worsening pain at or near the incision site. MAKE  SURE YOU:  Understand these instructions. Will watch your condition. Will get help right away if you are not doing well or get worse. Document Released: 08/26/2006 Document Revised: 07/08/2011 Document Reviewed: 10/27/2006 [facility] Patient Information 2014 [place], [place].       WHAT IS A BLOOD TRANSFUSION? Blood Transfusion Information  A transfusion is the replacement of blood or some of its parts. Blood is made up of multiple cells which provide different functions. Red blood cells carry oxygen  and are used for blood loss replacement. White blood cells fight against infection. Platelets control bleeding. Plasma helps clot blood. Other blood products are available for specialized needs, such as hemophilia or other clotting disorders. BEFORE THE TRANSFUSION  Who gives blood for transfusions?  Healthy volunteers who are fully evaluated to make sure their blood is safe. This is blood bank blood. Transfusion therapy is the safest it has ever been in the practice of medicine. Before blood is taken from a donor, a complete history is taken to make sure that person has no history of diseases nor engages in risky social behavior (examples are intravenous drug use or sexual activity with multiple partners). The donor's travel history is screened to minimize risk of transmitting infections, such as malaria. The donated blood is tested for signs of infectious diseases, such as HIV and hepatitis. The blood is then tested to be sure it is compatible with you in order to minimize the chance of a transfusion reaction. If you or a relative donates blood, this is often done in anticipation of surgery and is not appropriate for emergency situations. It takes many days to process the donated blood. RISKS AND COMPLICATIONS Although transfusion therapy is very safe and saves many lives, the main dangers of transfusion include:  Getting an infectious disease. Developing a transfusion reaction. This is an  allergic reaction to something in the blood you were given. Every precaution is taken to prevent this. The decision to have a blood transfusion has been considered carefully by your  caregiver before blood is given. Blood is not given unless the benefits outweigh the risks. AFTER THE TRANSFUSION Right after receiving a blood transfusion, you will usually feel much better and more energetic. This is especially true if your red blood cells have gotten low (anemic). The transfusion raises the level of the red blood cells which carry oxygen , and this usually causes an energy increase. The nurse administering the transfusion will monitor you carefully for complications. HOME CARE INSTRUCTIONS  No special instructions are needed after a transfusion. You may find your energy is better. Speak with your caregiver about any limitations on activity for underlying diseases you may have. SEEK MEDICAL CARE IF:  Your condition is not improving after your transfusion. You develop redness or irritation at the intravenous (IV) site. SEEK IMMEDIATE MEDICAL CARE IF:  Any of the following symptoms occur over the next 12 hours: Shaking chills. You have a temperature by mouth above 102 F (38.9 C), not controlled by medicine. Chest, back, or muscle pain. People around you feel you are not acting correctly or are confused. Shortness of breath or difficulty breathing. Dizziness and fainting. You get a rash or develop hives. You have a decrease in urine output. Your urine turns a dark color or changes to pink, red, or brown. Any of the following symptoms occur over the next 10 days: You have a temperature by mouth above 102 F (38.9 C), not controlled by medicine. Shortness of breath. Weakness after normal activity. The white part of the eye turns yellow (jaundice). You have a decrease in the amount of urine or are urinating less often. Your urine turns a dark color or changes to pink, red, or brown. Document  Released: 04/12/2000 Document Revised: 07/08/2011 Document Reviewed: 11/30/2007 [facility] Patient Information 2014 [place], [place].  _______________________________________________________________________

## 2023-11-13 NOTE — Patient Outreach (Signed)
 LCSW called patient at scheduled time. LCSW was completing initial assessments with patient and patient questioned the rationale for asking all the questions. Patient stated she needed assistance with her anxiety after surgery. Patient reported feeling uncomfortable moving forward with the visit due to all the questions that she was being asked. LCSW informed patient that the assessments were included in the intake process and this visit is usually the longest. Patient informed LCSW that she would reach back out if she felt that she needed assistance after her surgery. LCSW has dis-enrolled patient from [facility] services. LCSW followed up with Sl[facility] Diesel who made the referral to let her know that patient has declined services at this time.   LCSW reached out to MD Newlin to alerted her to that a medication reconciliation was completed today and patient reported not taking some of medications on the list.  Olam Ally, MSW, LCSW [place]  [facility], [facility] Licensed Clinical Social Worker Direct Dial: [phone]

## 2023-11-13 NOTE — Progress Notes (Signed)
 COVID Vaccine received:  []  No [x]  Yes Date of any COVID positive Test in last 90 days:  PCP - Corrina Sabin, MD Cardiologist - Jerel Balding, MD  Dr. Ronal Ross, cardiac clearance in 06-20-23 Epic note Pulmonology- Reggy Salt, MD clearance in Epic note 09-08-23  Chest x-ray - 09-11-2023  2v  Epic EKG - 09-15-2023  Epic  Stress Test -  ECHO - 12-06-2022  Epic Cardiac Cath -  CT Coronary Calcium  score:   Bowel Prep - []  No  []   Yes __Bariatric  Pacemaker / ICD device [x]  No []  Yes   Spinal Cord Stimulator:[x]  No []  Yes       History of Sleep Apnea? []  No [x]  Yes   CPAP used?- []  No []  Yes    Does the patient monitor blood sugar?   []  N/A   [x]  No []  Yes  Patient has: []  NO Hx DM   [x]  Pre-DM   []  DM1  []   DM2 Last A1c was: 5.8  per Dr. Donneta 10-29-23 note     Blood Thinner / Instructions:  Eliquis   ??? Aspirin Instructions:  none  ERAS Protocol Ordered: []  No  [x]  Yes PRE-SURGERY []  ENSURE  []  G2   [x]  No Drink Ordered Patient is to be NPO after: 0430  Dental hx: []  Dentures:  []  N/A      []  Bridge or Partial:                   []  Loose or Damaged teeth:   Comments:   Activity level: Able to walk up 2 flights of stairs without becoming significantly short of breath or having chest pain?  [x]  No   []    Yes   Anesthesia review: Hx ARDS / Respiratory failure requiring Trache and ECMO at [facility] 07-2017, s/p cardioversion 2019 for A.fib, HTN, Pre-DM, GAD, CPS????,  OSA-CPAP, COPD, RA  Patient denies any S&S of respiratory illness or Covid - no shortness of breath, fever, cough or chest pain at PAT appointment.  Patient verbalized understanding and agreement to the Pre-Surgical Instructions that were given to them at this PAT appointment. Patient was also educated of the need to review these PAT instructions again prior to her surgery.I reviewed the appropriate phone numbers to call if they have any and questions or concerns.

## 2023-11-14 ENCOUNTER — Other Ambulatory Visit: Payer: Self-pay

## 2023-11-14 ENCOUNTER — Encounter (HOSPITAL_COMMUNITY)
Admission: RE | Admit: 2023-11-14 | Discharge: 2023-11-14 | Disposition: A | Discharge status: Home / Self Care | Source: Ambulatory Visit | Attending: Surgery | Admitting: Surgery

## 2023-11-14 ENCOUNTER — Encounter (HOSPITAL_COMMUNITY): Payer: Self-pay

## 2023-11-14 DIAGNOSIS — Z79620 Long term (current) use of immunosuppressive biologic: Secondary | ICD-10-CM | POA: Insufficient documentation

## 2023-11-14 DIAGNOSIS — Z87891 Personal history of nicotine dependence: Secondary | ICD-10-CM | POA: Insufficient documentation

## 2023-11-14 DIAGNOSIS — M069 Rheumatoid arthritis, unspecified: Secondary | ICD-10-CM | POA: Insufficient documentation

## 2023-11-14 DIAGNOSIS — D72829 Elevated white blood cell count, unspecified: Secondary | ICD-10-CM | POA: Insufficient documentation

## 2023-11-14 DIAGNOSIS — Z7901 Long term (current) use of anticoagulants: Secondary | ICD-10-CM | POA: Insufficient documentation

## 2023-11-14 DIAGNOSIS — K219 Gastro-esophageal reflux disease without esophagitis: Secondary | ICD-10-CM | POA: Insufficient documentation

## 2023-11-14 DIAGNOSIS — Z6841 Body Mass Index (BMI) 40.0 and over, adult: Secondary | ICD-10-CM | POA: Insufficient documentation

## 2023-11-14 DIAGNOSIS — J4489 Other specified chronic obstructive pulmonary disease: Secondary | ICD-10-CM | POA: Insufficient documentation

## 2023-11-14 DIAGNOSIS — I5032 Chronic diastolic (congestive) heart failure: Secondary | ICD-10-CM | POA: Insufficient documentation

## 2023-11-14 DIAGNOSIS — Z01812 Encounter for preprocedural laboratory examination: Secondary | ICD-10-CM | POA: Insufficient documentation

## 2023-11-14 DIAGNOSIS — F32A Depression, unspecified: Secondary | ICD-10-CM | POA: Insufficient documentation

## 2023-11-14 DIAGNOSIS — I480 Paroxysmal atrial fibrillation: Secondary | ICD-10-CM | POA: Insufficient documentation

## 2023-11-14 DIAGNOSIS — G4733 Obstructive sleep apnea (adult) (pediatric): Secondary | ICD-10-CM | POA: Insufficient documentation

## 2023-11-14 DIAGNOSIS — F419 Anxiety disorder, unspecified: Secondary | ICD-10-CM | POA: Insufficient documentation

## 2023-11-14 DIAGNOSIS — Z7951 Long term (current) use of inhaled steroids: Secondary | ICD-10-CM | POA: Insufficient documentation

## 2023-11-14 DIAGNOSIS — I110 Hypertensive heart disease with heart failure: Secondary | ICD-10-CM | POA: Insufficient documentation

## 2023-11-14 HISTORY — DX: Prediabetes: R73.03

## 2023-11-14 HISTORY — DX: Cardiac arrhythmia, unspecified: I49.9

## 2023-11-14 HISTORY — DX: Hypothyroidism, unspecified: E03.9

## 2023-11-14 LAB — COMPREHENSIVE METABOLIC PANEL WITH GFR
ALT: 11 U/L (ref 0–44)
AST: 13 U/L — ABNORMAL LOW (ref 15–41)
Albumin: 3.2 g/dL — ABNORMAL LOW (ref 3.5–5.0)
Alkaline Phosphatase: 71 U/L (ref 38–126)
Anion gap: 6 (ref 5–15)
BUN: 10 mg/dL (ref 6–20)
CO2: 24 mmol/L (ref 22–32)
Calcium: 8.7 mg/dL — ABNORMAL LOW (ref 8.9–10.3)
Chloride: 108 mmol/L (ref 98–111)
Creatinine, Ser: 0.82 mg/dL (ref 0.44–1.00)
GFR, Estimated: >60 mL/min (ref >60)
Glucose, Bld: 88 mg/dL (ref 70–99)
Potassium: 4.1 mmol/L (ref 3.5–5.1)
Sodium: 138 mmol/L (ref 135–145)
Total Bilirubin: 0.3 mg/dL (ref 0.0–1.2)
Total Protein: 7.1 g/dL (ref 6.5–8.1)

## 2023-11-14 LAB — CBC WITH DIFFERENTIAL/PLATELET
Abs Immature Granulocytes: 0.06 K/uL (ref 0.00–0.07)
Basophils Absolute: 0.1 K/uL (ref 0.0–0.1)
Basophils Relative: 1 %
Eosinophils Absolute: 0.3 K/uL (ref 0.0–0.5)
Eosinophils Relative: 2 %
HCT: 39.5 % (ref 36.0–46.0)
Hemoglobin: 12.2 g/dL (ref 12.0–15.0)
Immature Granulocytes: 1 %
Lymphocytes Relative: 30 %
Lymphs Abs: 3.4 K/uL (ref 0.7–4.0)
MCH: 27.7 pg (ref 26.0–34.0)
MCHC: 30.9 g/dL (ref 30.0–36.0)
MCV: 89.6 fL (ref 80.0–100.0)
Monocytes Absolute: 1.1 K/uL — ABNORMAL HIGH (ref 0.1–1.0)
Monocytes Relative: 9 %
Neutro Abs: 6.4 K/uL (ref 1.7–7.7)
Neutrophils Relative %: 57 %
Platelets: 363 K/uL (ref 150–400)
RBC: 4.41 MIL/uL (ref 3.87–5.11)
RDW: 17.8 % — ABNORMAL HIGH (ref 11.5–15.5)
WBC: 11.3 K/uL — ABNORMAL HIGH (ref 4.0–10.5)
nRBC: 0 % (ref 0.0–0.2)

## 2023-11-14 NOTE — Progress Notes (Signed)
 Case: [id number] Date/Time: 11/17/23 0715   Procedures:      LAPAROSCOPIC ROUX-EN-Y GASTRIC - LAPAROSCOPIC GASTRIC BYPASS     ENDOSCOPY, UPPER GI TRACT   Anesthesia type: General   Diagnosis: Morbid obesity (HCC) [E66.01]   Pre-op diagnosis: MORBID OBESITY   Location: [facility] ROOM 05 / [facility]   Surgeons: Signe Mitzie LABOR, MD       DISCUSSION: April [name] is a 53 yo female who presents to PAT prior to surgery above. PMH of former smoking, HTN, PAF on Eliquis , HFpEF, uncontrolled asthma, COPD, hx of ARF and trach, OSA (uses CPAP), headaches, GERD, anxiety, depression, obesity (BMI 54), RA.  Patient was admitted to [facility] in 2019 for ARDS 2/2 flu and asthma exacerbation. She failed prone positioning and was started on VV ECMO cannulation for management of refractory hypoxemia & ARDS.  Had trach and was decannulated when she was discharged to rehab.  Patient seen by Cardiology on 06/20/23 for pre op clearance. Echo in 11/2022 showed normal LVEF, grade I DD, normal valves. Cleared for surgery:  This is my first time meeting her but,  she can do > 4 METS without shortness of breath or chest pressure. Her EF is normal and she has no pulmonary HTN. She is in sinus rhythm. Her RCRI is a score of 1, 6% of a major cardiac event. She is acceptable cardiac risk for bariatric surgery.  Patient follows with Pulmonology for chronic severe asthma, COPD, OSA. Last seen by Dr. Neysa on 09/08/23. Noted to be stable on Tezspire  and Trelegy. Cleared for surgery:  Addendum for surgical clearance 09/18/23:  She is clear from pulmonary standpoint for planned surgery, which should be done in hospital with Pulmonary consultation available if needed. She is at higher than average risk due to her multiple medical problems.   VS: BP 128/78 Comment: right forearm sitting,   per patient requests  Pulse 80   Temp 36.8 C (Oral)   Resp (!) 22   Ht 5' 4.5 (1.638 m)   Wt (!) 145.6 kg   LMP 11/10/2023 (Exact Date)   SpO2 99%    BMI 54.25 kg/m   PROVIDERS: Delbert Clam, MD   LABS: Labs reviewed: Acceptable for surgery. Pt with mild chronic leukocytosis. (all labs ordered are listed, but only abnormal results are displayed)  Labs Reviewed  CBC WITH DIFFERENTIAL/PLATELET  COMPREHENSIVE METABOLIC PANEL WITH GFR  TYPE AND SCREEN     IMAGES: CXR 09/11/23:  FINDINGS: The heart size and mediastinal contours are within normal limits. No focal consolidation, pleural effusion, or pneumothorax. No acute osseous abnormality.   IMPRESSION: No acute cardiopulmonary findings.      EKG 09/11/23:  Sinus rhythm, rate 99 Probable left atrial enlargement Left anterior fascicular block Anterior infarct, old  CV:  Echo 12/06/22:  IMPRESSIONS    1. Left ventricular ejection fraction, by estimation, is 60 to 65%. The left ventricle has normal function. The left ventricle has no regional wall motion abnormalities. There is mild concentric left ventricular hypertrophy. Left ventricular diastolic parameters are consistent with Grade I diastolic dysfunction (impaired relaxation).  2. Right ventricular systolic function is normal. The right ventricular size is normal. Tricuspid regurgitation signal is inadequate for assessing PA pressure.  3. The mitral valve is grossly normal. No evidence of mitral valve regurgitation. No evidence of mitral stenosis.  4. The aortic valve is tricuspid. Aortic valve regurgitation is not visualized. No aortic stenosis is present.  5. The inferior vena cava is normal in  size with greater than 50% respiratory variability, suggesting right atrial pressure of 3 mmHg.  Comparison(s): No significant change from prior study. Past Medical History:  Diagnosis Date   Acanthosis nigricans    Anxiety    ARDS (adult respiratory distress syndrome) (HCC) 2019   Respiratory Failure, had Trache / ECMO at [facility]   Arthritis    knees (04/28/2017)   Asthma    Followed by Dr. Neysa  (pulmonology); receives every other week omalizumab  injections; has frequent exacerbations   Back pain    Chronic diastolic CHF (congestive heart failure) (HCC) 01/17/2017   COPD (chronic obstructive pulmonary disease) (HCC)    PFTs in 2002, FEV1/FVC 65, no post bronchodilater test done   Depression    Dysrhythmia    A. Fib   GERD (gastroesophageal reflux disease)    Headache(784.0)    q couple days (04/28/2017)   Helicobacter pylori (H. pylori) infection    Hypertension, essential    Hypothyroidism    Insomnia    Joint pain    Lower extremity edema    Menorrhagia    Morbid obesity (HCC)    OSA on CPAP    Sleep study 2008 - mild OSA, not enough events to titrate CPAP; wears CPAP now/pt on 04/28/2017   Pneumonia X 1   Pre-diabetes    Rheumatoid arthritis (HCC)    Seasonal allergies    Shortness of breath    Tobacco user    Vitamin D  deficiency     Past Surgical History:  Procedure Laterality Date   CARDIOVERSION N/A 05/30/2017   Procedure: CARDIOVERSION;  Surgeon: Francyne Headland, MD;  Location: [facility];  Service: Cardiovascular;  Laterality: N/A;   COLONOSCOPY WITH ESOPHAGOGASTRODUODENOSCOPY (EGD)     for Pre- Bariatric surgery evaluation   EXTRACORPOREAL CIRCULATION  2019   For ARDS, Respiratory Failure   done at [facility]   REDUCTION MAMMAPLASTY Bilateral 09/2011   TUBAL LIGATION  1996   bilateral    MEDICATIONS:  albuterol  (VENTOLIN  HFA) 108 (90 Base) MCG/ACT inhaler   amLODipine  (NORVASC ) 10 MG tablet   atorvastatin  (LIPITOR) 20 MG tablet   carvedilol  (COREG ) 6.25 MG tablet   clonazePAM  (KLONOPIN ) 0.5 MG tablet   cloNIDine  (CATAPRES ) 0.2 MG tablet   docusate sodium  (COLACE) 100 MG capsule   ELIQUIS  5 MG TABS tablet   Fezolinetant  (VEOZAH ) 45 MG TABS   fluticasone  (FLONASE ) 50 MCG/ACT nasal spray   Fluticasone -Umeclidin-Vilant (TRELEGY ELLIPTA ) 200-62.5-25 MCG/ACT AEPB   furosemide  (LASIX ) 20 MG tablet   gabapentin  (NEURONTIN ) 300 MG capsule    ipratropium-albuterol  (DUONEB) 0.5-2.5 (3) MG/3ML SOLN   levothyroxine  (SYNTHROID ) 50 MCG tablet   methocarbamol  (ROBAXIN ) 750 MG tablet   montelukast  (SINGULAIR ) 10 MG tablet   naloxone  (NARCAN ) nasal spray 4 mg/0.1 mL   omeprazole  (PRILOSEC ) 40 MG capsule   oxyCODONE -acetaminophen  (PERCOCET) 10-325 MG tablet   Potassium Chloride  ER 20 MEQ TBCR   pregabalin  (LYRICA ) 75 MG capsule   RYBELSUS  3 MG TABS   solifenacin  (VESICARE ) 5 MG tablet   Tezepelumab -ekko (TEZSPIRE ) 210 MG/1. SOAJ   tiZANidine  (ZANAFLEX ) 2 MG tablet   topiramate  (TOPAMAX ) 200 MG tablet   Vitamin D , Ergocalciferol , (DRISDOL ) 1.25 MG (50000 UNIT) CAPS capsule   zolpidem  (AMBIEN ) 10 MG tablet   No current facility-administered medications for this encounter.    tezepelumab -ekko (TEZSPIRE ) 210 MG/1. syringe 210 mg   tezepelumab -ekko (TEZSPIRE ) 210 MG/1. syringe 210 mg   Pharrah Rottman M Ianmichael Amescua, PA-C [facility] Short Stay/Anesthesiology [facility] Phone [phone] 11/14/2023 11:54  AM

## 2023-11-17 ENCOUNTER — Inpatient Hospital Stay (HOSPITAL_COMMUNITY): Payer: Self-pay | Admitting: Anesthesiology

## 2023-11-17 ENCOUNTER — Encounter (HOSPITAL_COMMUNITY)
Admission: RE | Disposition: A | Discharge status: Home / Self Care | Payer: Self-pay | Source: Home / Self Care | Attending: Surgery

## 2023-11-17 ENCOUNTER — Other Ambulatory Visit: Payer: Self-pay

## 2023-11-17 ENCOUNTER — Encounter (HOSPITAL_COMMUNITY): Payer: Self-pay | Admitting: Surgery

## 2023-11-17 ENCOUNTER — Inpatient Hospital Stay (HOSPITAL_COMMUNITY)
Admission: RE | Admit: 2023-11-17 | Discharge: 2023-11-19 | DRG: 620 | Disposition: A | Discharge status: Home / Self Care | Attending: Surgery | Admitting: Surgery

## 2023-11-17 ENCOUNTER — Ambulatory Visit: Payer: 59 | Admitting: Family Medicine

## 2023-11-17 DIAGNOSIS — J4489 Other specified chronic obstructive pulmonary disease: Secondary | ICD-10-CM | POA: Diagnosis present

## 2023-11-17 DIAGNOSIS — G4700 Insomnia, unspecified: Secondary | ICD-10-CM | POA: Diagnosis present

## 2023-11-17 DIAGNOSIS — Z7901 Long term (current) use of anticoagulants: Secondary | ICD-10-CM

## 2023-11-17 DIAGNOSIS — Z7951 Long term (current) use of inhaled steroids: Secondary | ICD-10-CM

## 2023-11-17 DIAGNOSIS — K219 Gastro-esophageal reflux disease without esophagitis: Secondary | ICD-10-CM | POA: Diagnosis present

## 2023-11-17 DIAGNOSIS — Z8249 Family history of ischemic heart disease and other diseases of the circulatory system: Secondary | ICD-10-CM | POA: Diagnosis not present

## 2023-11-17 DIAGNOSIS — G4733 Obstructive sleep apnea (adult) (pediatric): Secondary | ICD-10-CM | POA: Diagnosis present

## 2023-11-17 DIAGNOSIS — I480 Paroxysmal atrial fibrillation: Secondary | ICD-10-CM | POA: Diagnosis present

## 2023-11-17 DIAGNOSIS — T380X5S Adverse effect of glucocorticoids and synthetic analogues, sequela: Secondary | ICD-10-CM

## 2023-11-17 DIAGNOSIS — F329 Major depressive disorder, single episode, unspecified: Secondary | ICD-10-CM | POA: Diagnosis present

## 2023-11-17 DIAGNOSIS — Z604 Social exclusion and rejection: Secondary | ICD-10-CM | POA: Diagnosis present

## 2023-11-17 DIAGNOSIS — M069 Rheumatoid arthritis, unspecified: Secondary | ICD-10-CM | POA: Diagnosis present

## 2023-11-17 DIAGNOSIS — Z8619 Personal history of other infectious and parasitic diseases: Secondary | ICD-10-CM | POA: Diagnosis not present

## 2023-11-17 DIAGNOSIS — Z91041 Radiographic dye allergy status: Secondary | ICD-10-CM

## 2023-11-17 DIAGNOSIS — I5032 Chronic diastolic (congestive) heart failure: Secondary | ICD-10-CM | POA: Diagnosis present

## 2023-11-17 DIAGNOSIS — F419 Anxiety disorder, unspecified: Secondary | ICD-10-CM | POA: Diagnosis present

## 2023-11-17 DIAGNOSIS — Z6841 Body Mass Index (BMI) 40.0 and over, adult: Secondary | ICD-10-CM | POA: Diagnosis not present

## 2023-11-17 DIAGNOSIS — F17200 Nicotine dependence, unspecified, uncomplicated: Secondary | ICD-10-CM | POA: Diagnosis present

## 2023-11-17 DIAGNOSIS — Z8701 Personal history of pneumonia (recurrent): Secondary | ICD-10-CM

## 2023-11-17 DIAGNOSIS — E559 Vitamin D deficiency, unspecified: Secondary | ICD-10-CM | POA: Diagnosis present

## 2023-11-17 DIAGNOSIS — Z79899 Other long term (current) drug therapy: Secondary | ICD-10-CM

## 2023-11-17 DIAGNOSIS — E099 Drug or chemical induced diabetes mellitus without complications: Secondary | ICD-10-CM | POA: Diagnosis present

## 2023-11-17 DIAGNOSIS — E039 Hypothyroidism, unspecified: Secondary | ICD-10-CM | POA: Diagnosis present

## 2023-11-17 DIAGNOSIS — I110 Hypertensive heart disease with heart failure: Secondary | ICD-10-CM | POA: Diagnosis present

## 2023-11-17 DIAGNOSIS — Z5986 Financial insecurity: Secondary | ICD-10-CM | POA: Diagnosis not present

## 2023-11-17 DIAGNOSIS — M170 Bilateral primary osteoarthritis of knee: Secondary | ICD-10-CM | POA: Diagnosis present

## 2023-11-17 DIAGNOSIS — E7800 Pure hypercholesterolemia, unspecified: Secondary | ICD-10-CM | POA: Diagnosis present

## 2023-11-17 DIAGNOSIS — Z885 Allergy status to narcotic agent status: Secondary | ICD-10-CM

## 2023-11-17 DIAGNOSIS — I5033 Acute on chronic diastolic (congestive) heart failure: Secondary | ICD-10-CM | POA: Diagnosis not present

## 2023-11-17 HISTORY — PX: UPPER GI ENDOSCOPY: SHX6162

## 2023-11-17 HISTORY — PX: GASTRIC ROUX-EN-Y: SHX5262

## 2023-11-17 LAB — TYPE AND SCREEN
ABO/RH(D): B POS
Antibody Screen: NEGATIVE

## 2023-11-17 LAB — POCT PREGNANCY, URINE: Preg Test, Ur: NEGATIVE

## 2023-11-17 SURGERY — LAPAROSCOPIC ROUX-EN-Y GASTRIC
Anesthesia: General

## 2023-11-17 MED ORDER — PHENYLEPHRINE 80 MCG/ML (10ML) SYRINGE FOR IV PUSH (FOR BLOOD PRESSURE SUPPORT)
PREFILLED_SYRINGE | INTRAVENOUS | Status: DC | PRN
  Administered 2023-11-17: 80 ug via INTRAVENOUS

## 2023-11-17 MED ORDER — LIDOCAINE HCL (PF) 2 % IJ SOLN
INTRAMUSCULAR | Status: DC | PRN
  Administered 2023-11-17: 100 mg via INTRADERMAL

## 2023-11-17 MED ORDER — AMISULPRIDE (ANTIEMETIC) 5 MG/2ML IV SOLN: 10.0000 mg | Freq: Once | INTRAVENOUS | Status: DC | PRN

## 2023-11-17 MED ORDER — ENSURE MAX PROTEIN PO LIQD
2.0000 [oz_av] | ORAL | Status: DC
  Administered 2023-11-18 – 2023-11-19 (×10): 2 [oz_av] via ORAL

## 2023-11-17 MED ORDER — SCOPOLAMINE 1 MG/3DAYS TD PT72
1.0000 | MEDICATED_PATCH | TRANSDERMAL | Status: DC
  Administered 2023-11-17: 1.5 mg via TRANSDERMAL
  Filled 2023-11-17: qty 1

## 2023-11-17 MED ORDER — SUGAMMADEX SODIUM 200 MG/2ML IV SOLN
INTRAVENOUS | Status: DC | PRN
  Administered 2023-11-17: 200 mg via INTRAVENOUS

## 2023-11-17 MED ORDER — SUCCINYLCHOLINE CHLORIDE 200 MG/10ML IV SOSY
PREFILLED_SYRINGE | INTRAVENOUS | Status: AC
  Filled 2023-11-17: qty 10

## 2023-11-17 MED ORDER — HEPARIN SODIUM (PORCINE) 5000 UNIT/ML IJ SOLN
5000.0000 [IU] | INTRAMUSCULAR | Status: AC
  Administered 2023-11-17: 5000 [IU] via SUBCUTANEOUS
  Filled 2023-11-17: qty 1

## 2023-11-17 MED ORDER — HYDRALAZINE HCL 20 MG/ML IJ SOLN: 10.0000 mg | INTRAMUSCULAR | Status: DC | PRN

## 2023-11-17 MED ORDER — ALBUMIN HUMAN 5 % IV SOLN: INTRAVENOUS | Status: DC | PRN

## 2023-11-17 MED ORDER — ZOLPIDEM TARTRATE 5 MG PO TABS
5.0000 mg | ORAL_TABLET | Freq: Every evening | ORAL | Status: DC | PRN
  Administered 2023-11-18 – 2023-11-19 (×2): 5 mg via ORAL
  Filled 2023-11-17 (×2): qty 1

## 2023-11-17 MED ORDER — GABAPENTIN 100 MG PO CAPS
200.0000 mg | ORAL_CAPSULE | Freq: Two times a day (BID) | ORAL | Status: DC
  Administered 2023-11-17 – 2023-11-19 (×4): 200 mg via ORAL
  Filled 2023-11-17 (×4): qty 2

## 2023-11-17 MED ORDER — EPHEDRINE 5 MG/ML INJ
INTRAVENOUS | Status: AC
  Filled 2023-11-17: qty 5

## 2023-11-17 MED ORDER — CHLORHEXIDINE GLUCONATE 0.12 % MT SOLN
15.0000 mL | Freq: Once | OROMUCOSAL | Status: AC
  Administered 2023-11-17: 15 mL via OROMUCOSAL

## 2023-11-17 MED ORDER — 0.9 % SODIUM CHLORIDE (POUR BTL) OPTIME
TOPICAL | Status: DC | PRN
  Administered 2023-11-17: 1000 mL

## 2023-11-17 MED ORDER — PHENYLEPHRINE HCL-NACL 20-0.9 MG/250ML-% IV SOLN
INTRAVENOUS | Status: DC | PRN
  Administered 2023-11-17: 40 ug/min via INTRAVENOUS

## 2023-11-17 MED ORDER — FENTANYL CITRATE (PF) 100 MCG/2ML IJ SOLN
INTRAMUSCULAR | Status: DC | PRN
  Administered 2023-11-17 (×3): 100 ug via INTRAVENOUS

## 2023-11-17 MED ORDER — METOCLOPRAMIDE HCL 5 MG/ML IJ SOLN
10.0000 mg | Freq: Four times a day (QID) | INTRAMUSCULAR | Status: DC
  Administered 2023-11-17 – 2023-11-19 (×8): 10 mg via INTRAVENOUS
  Filled 2023-11-17 (×8): qty 2

## 2023-11-17 MED ORDER — LEVOTHYROXINE SODIUM 50 MCG PO TABS
50.0000 ug | ORAL_TABLET | Freq: Every day | ORAL | Status: DC
  Administered 2023-11-18 – 2023-11-19 (×2): 50 ug via ORAL
  Filled 2023-11-17 (×2): qty 1

## 2023-11-17 MED ORDER — ACETAMINOPHEN 160 MG/5ML PO SOLN: 1000.0000 mg | Freq: Three times a day (TID) | ORAL | Status: DC

## 2023-11-17 MED ORDER — SUCCINYLCHOLINE CHLORIDE 200 MG/10ML IV SOSY
PREFILLED_SYRINGE | INTRAVENOUS | Status: DC | PRN
  Administered 2023-11-17: 160 mg via INTRAVENOUS

## 2023-11-17 MED ORDER — STERILE WATER FOR IRRIGATION IR SOLN
Status: DC | PRN
  Administered 2023-11-17: 1000 mL

## 2023-11-17 MED ORDER — ONDANSETRON HCL 4 MG/2ML IJ SOLN
INTRAMUSCULAR | Status: AC
  Filled 2023-11-17: qty 2

## 2023-11-17 MED ORDER — FENTANYL CITRATE (PF) 100 MCG/2ML IJ SOLN
INTRAMUSCULAR | Status: AC
  Filled 2023-11-17: qty 2

## 2023-11-17 MED ORDER — LACTATED RINGERS IV SOLN: INTRAVENOUS | Status: DC

## 2023-11-17 MED ORDER — HYDROMORPHONE HCL 1 MG/ML IJ SOLN: 0.5000 mg | INTRAMUSCULAR | Status: DC | PRN

## 2023-11-17 MED ORDER — OXYCODONE HCL 5 MG/5ML PO SOLN: 5.0000 mg | Freq: Once | ORAL | Status: DC | PRN

## 2023-11-17 MED ORDER — ORAL CARE MOUTH RINSE: 15.0000 mL | Freq: Once | OROMUCOSAL | Status: AC

## 2023-11-17 MED ORDER — GABAPENTIN 100 MG PO CAPS
100.0000 mg | ORAL_CAPSULE | ORAL | Status: AC
  Administered 2023-11-17: 100 mg via ORAL
  Filled 2023-11-17: qty 1

## 2023-11-17 MED ORDER — CARVEDILOL 6.25 MG PO TABS
6.2500 mg | ORAL_TABLET | Freq: Every day | ORAL | Status: DC
  Administered 2023-11-18 – 2023-11-19 (×2): 6.25 mg via ORAL
  Filled 2023-11-17 (×2): qty 1

## 2023-11-17 MED ORDER — OXYCODONE HCL 5 MG PO TABS: 5.0000 mg | ORAL_TABLET | Freq: Once | ORAL | Status: DC | PRN

## 2023-11-17 MED ORDER — ACETAMINOPHEN 500 MG PO TABS
1000.0000 mg | ORAL_TABLET | Freq: Three times a day (TID) | ORAL | Status: DC
  Administered 2023-11-17 – 2023-11-19 (×5): 1000 mg via ORAL
  Filled 2023-11-17 (×5): qty 2

## 2023-11-17 MED ORDER — SIMETHICONE 80 MG PO CHEW
80.0000 mg | CHEWABLE_TABLET | Freq: Four times a day (QID) | ORAL | Status: DC | PRN
  Administered 2023-11-17: 80 mg via ORAL
  Filled 2023-11-17: qty 1

## 2023-11-17 MED ORDER — ROCURONIUM BROMIDE 10 MG/ML (PF) SYRINGE
PREFILLED_SYRINGE | INTRAVENOUS | Status: AC
  Filled 2023-11-17: qty 10

## 2023-11-17 MED ORDER — IPRATROPIUM-ALBUTEROL 0.5-2.5 (3) MG/3ML IN SOLN
3.0000 mL | RESPIRATORY_TRACT | Status: DC | PRN
  Administered 2023-11-19: 3 mL via RESPIRATORY_TRACT
  Filled 2023-11-17: qty 3

## 2023-11-17 MED ORDER — ROCURONIUM BROMIDE 10 MG/ML (PF) SYRINGE
PREFILLED_SYRINGE | INTRAVENOUS | Status: DC | PRN
  Administered 2023-11-17: 20 mg via INTRAVENOUS
  Administered 2023-11-17: 60 mg via INTRAVENOUS

## 2023-11-17 MED ORDER — METHOCARBAMOL 1000 MG/10ML IJ SOLN
500.0000 mg | Freq: Four times a day (QID) | INTRAMUSCULAR | Status: DC | PRN
  Administered 2023-11-17: 500 mg via INTRAVENOUS
  Filled 2023-11-17: qty 10

## 2023-11-17 MED ORDER — VISTASEAL 4 ML SINGLE DOSE KIT
PACK | CUTANEOUS | Status: DC | PRN
  Administered 2023-11-17: 4 mL via TOPICAL

## 2023-11-17 MED ORDER — BUPIVACAINE-EPINEPHRINE (PF) 0.25% -1:200000 IJ SOLN
INTRAMUSCULAR | Status: AC
  Filled 2023-11-17: qty 30

## 2023-11-17 MED ORDER — EPHEDRINE SULFATE-NACL 50-0.9 MG/10ML-% IV SOSY
PREFILLED_SYRINGE | INTRAVENOUS | Status: DC | PRN
  Administered 2023-11-17: 5 mg via INTRAVENOUS

## 2023-11-17 MED ORDER — MEPERIDINE HCL 25 MG/ML IJ SOLN: 6.2500 mg | INTRAMUSCULAR | Status: DC | PRN

## 2023-11-17 MED ORDER — VISTASEAL 4 ML SINGLE DOSE KIT
4.0000 mL | PACK | Freq: Once | CUTANEOUS | Status: DC
  Filled 2023-11-17: qty 4

## 2023-11-17 MED ORDER — HYDROMORPHONE HCL 1 MG/ML IJ SOLN
0.2500 mg | INTRAMUSCULAR | Status: DC | PRN
  Administered 2023-11-17: 0.5 mg via INTRAVENOUS

## 2023-11-17 MED ORDER — PHENYLEPHRINE 80 MCG/ML (10ML) SYRINGE FOR IV PUSH (FOR BLOOD PRESSURE SUPPORT)
PREFILLED_SYRINGE | INTRAVENOUS | Status: AC
  Filled 2023-11-17: qty 10

## 2023-11-17 MED ORDER — BISACODYL 10 MG RE SUPP: 10.0000 mg | Freq: Every day | RECTAL | Status: DC | PRN

## 2023-11-17 MED ORDER — CLONAZEPAM 0.5 MG PO TABS
0.5000 mg | ORAL_TABLET | Freq: Every day | ORAL | Status: DC
  Administered 2023-11-17 – 2023-11-18 (×2): 0.5 mg via ORAL
  Filled 2023-11-17 (×2): qty 1

## 2023-11-17 MED ORDER — TRAMADOL HCL 50 MG PO TABS
50.0000 mg | ORAL_TABLET | Freq: Four times a day (QID) | ORAL | Status: DC | PRN
  Administered 2023-11-18 – 2023-11-19 (×4): 50 mg via ORAL
  Filled 2023-11-17 (×4): qty 1

## 2023-11-17 MED ORDER — PROPOFOL 10 MG/ML IV BOLUS
INTRAVENOUS | Status: AC
  Filled 2023-11-17: qty 20

## 2023-11-17 MED ORDER — HEPARIN SODIUM (PORCINE) 5000 UNIT/ML IJ SOLN
5000.0000 [IU] | Freq: Three times a day (TID) | INTRAMUSCULAR | Status: DC
  Administered 2023-11-18 – 2023-11-19 (×3): 5000 [IU] via SUBCUTANEOUS
  Filled 2023-11-17 (×3): qty 1

## 2023-11-17 MED ORDER — BUDESON-GLYCOPYRROL-FORMOTEROL 160-9-4.8 MCG/ACT IN AERO
2.0000 | INHALATION_SPRAY | Freq: Two times a day (BID) | RESPIRATORY_TRACT | Status: DC
  Filled 2023-11-17: qty 5.9

## 2023-11-17 MED ORDER — DEXAMETHASONE SODIUM PHOSPHATE 10 MG/ML IJ SOLN
INTRAMUSCULAR | Status: DC | PRN
  Administered 2023-11-17: 5 mg via INTRAVENOUS

## 2023-11-17 MED ORDER — MIDAZOLAM HCL 5 MG/5ML IJ SOLN
INTRAMUSCULAR | Status: DC | PRN
  Administered 2023-11-17: 2 mg via INTRAVENOUS

## 2023-11-17 MED ORDER — HYDROMORPHONE HCL 1 MG/ML IJ SOLN
INTRAMUSCULAR | Status: AC
Start: 2023-11-17 — End: 2023-11-17
  Filled 2023-11-17: qty 1

## 2023-11-17 MED ORDER — BUDESON-GLYCOPYRROL-FORMOTEROL 160-9-4.8 MCG/ACT IN AERO
2.0000 | INHALATION_SPRAY | Freq: Two times a day (BID) | RESPIRATORY_TRACT | Status: DC
  Administered 2023-11-17 – 2023-11-19 (×4): 2 via RESPIRATORY_TRACT
  Filled 2023-11-17: qty 5.9

## 2023-11-17 MED ORDER — SODIUM CHLORIDE 0.9 % IV SOLN: INTRAVENOUS | Status: AC

## 2023-11-17 MED ORDER — ENOXAPARIN (LOVENOX) PATIENT EDUCATION KIT
PACK | Freq: Once | Status: AC
  Filled 2023-11-17: qty 1

## 2023-11-17 MED ORDER — ONDANSETRON HCL 4 MG/2ML IJ SOLN
4.0000 mg | INTRAMUSCULAR | Status: DC | PRN
  Administered 2023-11-17: 4 mg via INTRAVENOUS
  Filled 2023-11-17: qty 2

## 2023-11-17 MED ORDER — PANTOPRAZOLE SODIUM 40 MG IV SOLR
40.0000 mg | Freq: Every day | INTRAVENOUS | Status: DC
  Administered 2023-11-17 – 2023-11-18 (×2): 40 mg via INTRAVENOUS
  Filled 2023-11-17 (×2): qty 10

## 2023-11-17 MED ORDER — BUPIVACAINE-EPINEPHRINE 0.25% -1:200000 IJ SOLN
INTRAMUSCULAR | Status: DC | PRN
  Administered 2023-11-17: 60 mL

## 2023-11-17 MED ORDER — MIDAZOLAM HCL 2 MG/2ML IJ SOLN
INTRAMUSCULAR | Status: AC
  Filled 2023-11-17: qty 2

## 2023-11-17 MED ORDER — CHLORHEXIDINE GLUCONATE 4 % EX SOLN: 60.0000 mL | Freq: Once | CUTANEOUS | Status: DC

## 2023-11-17 MED ORDER — SODIUM CHLORIDE 0.9 % IV SOLN: 12.5000 mg | INTRAVENOUS | Status: DC | PRN

## 2023-11-17 MED ORDER — METOPROLOL TARTRATE 5 MG/5ML IV SOLN: 5.0000 mg | Freq: Four times a day (QID) | INTRAVENOUS | Status: DC | PRN

## 2023-11-17 MED ORDER — ALBUMIN HUMAN 5 % IV SOLN
INTRAVENOUS | Status: AC
  Filled 2023-11-17: qty 250

## 2023-11-17 MED ORDER — CARVEDILOL 6.25 MG PO TABS: 6.2500 mg | ORAL_TABLET | Freq: Every day | ORAL | Status: DC

## 2023-11-17 MED ORDER — PROPOFOL 10 MG/ML IV BOLUS
INTRAVENOUS | Status: DC | PRN
  Administered 2023-11-17: 150 mg via INTRAVENOUS

## 2023-11-17 MED ORDER — OXYCODONE HCL 5 MG/5ML PO SOLN
5.0000 mg | Freq: Four times a day (QID) | ORAL | Status: DC | PRN
  Administered 2023-11-17 – 2023-11-19 (×7): 5 mg via ORAL
  Filled 2023-11-17 (×7): qty 5

## 2023-11-17 MED ORDER — ALBUTEROL SULFATE HFA 108 (90 BASE) MCG/ACT IN AERS: 2.0000 | INHALATION_SPRAY | Freq: Four times a day (QID) | RESPIRATORY_TRACT | Status: DC | PRN

## 2023-11-17 MED ORDER — FIBRIN SEALANT 2 ML SINGLE DOSE KIT
2.0000 mL | PACK | Freq: Once | CUTANEOUS | Status: AC
  Administered 2023-11-17: 2 mL via TOPICAL
  Filled 2023-11-17: qty 2

## 2023-11-17 MED ORDER — APREPITANT 40 MG PO CAPS
40.0000 mg | ORAL_CAPSULE | ORAL | Status: AC
  Administered 2023-11-17: 40 mg via ORAL
  Filled 2023-11-17: qty 1

## 2023-11-17 MED ORDER — DEXAMETHASONE SODIUM PHOSPHATE 10 MG/ML IJ SOLN
INTRAMUSCULAR | Status: AC
  Filled 2023-11-17: qty 1

## 2023-11-17 MED ORDER — DOCUSATE SODIUM 100 MG PO CAPS
100.0000 mg | ORAL_CAPSULE | Freq: Two times a day (BID) | ORAL | Status: DC
  Administered 2023-11-17 – 2023-11-19 (×4): 100 mg via ORAL
  Filled 2023-11-17 (×4): qty 1

## 2023-11-17 MED ORDER — HEPARIN SODIUM (PORCINE) 5000 UNIT/ML IJ SOLN: 5000.0000 [IU] | Freq: Three times a day (TID) | INTRAMUSCULAR | Status: DC

## 2023-11-17 MED ORDER — ONDANSETRON HCL 4 MG/2ML IJ SOLN
INTRAMUSCULAR | Status: DC | PRN
  Administered 2023-11-17: 4 mg via INTRAVENOUS

## 2023-11-17 MED ORDER — LACTATED RINGERS IR SOLN
Status: DC | PRN
  Administered 2023-11-17: 1

## 2023-11-17 MED ORDER — MONTELUKAST SODIUM 10 MG PO TABS
10.0000 mg | ORAL_TABLET | Freq: Every day | ORAL | Status: DC
  Administered 2023-11-17 – 2023-11-18 (×2): 10 mg via ORAL
  Filled 2023-11-17 (×2): qty 1

## 2023-11-17 MED ORDER — SODIUM CHLORIDE 0.9 % IV SOLN
2.0000 g | INTRAVENOUS | Status: AC
  Administered 2023-11-17: 2 g via INTRAVENOUS
  Filled 2023-11-17: qty 2

## 2023-11-17 MED ORDER — ACETAMINOPHEN 500 MG PO TABS
1000.0000 mg | ORAL_TABLET | ORAL | Status: AC
  Administered 2023-11-17: 1000 mg via ORAL
  Filled 2023-11-17: qty 2

## 2023-11-17 MED ORDER — LIDOCAINE HCL (PF) 2 % IJ SOLN
INTRAMUSCULAR | Status: AC
  Filled 2023-11-17: qty 5

## 2023-11-17 SURGICAL SUPPLY — 56 items
APPLICATOR COTTON TIP 6 STRL (MISCELLANEOUS) IMPLANT
APPLICATOR VISTASEAL 35 (MISCELLANEOUS) ×1 IMPLANT
BAG COUNTER SPONGE SURGICOUNT (BAG) IMPLANT
BENZOIN TINCTURE PRP APPL 2/3 (GAUZE/BANDAGES/DRESSINGS) ×1 IMPLANT
BLADE SURG SZ11 CARB STEEL (BLADE) ×1 IMPLANT
BNDG ADH 1X3 SHEER STRL LF (GAUZE/BANDAGES/DRESSINGS) ×6 IMPLANT
CABLE HIGH FREQUENCY MONO STRZ (ELECTRODE) IMPLANT
CHLORAPREP W/TINT 26 (MISCELLANEOUS) ×2 IMPLANT
CLIP APPLIE ROT 13.4 12 LRG (CLIP) IMPLANT
CLIP SUT LAPRA TY ABSORB (SUTURE) ×2 IMPLANT
COVER SURGICAL LIGHT HANDLE (MISCELLANEOUS) ×1 IMPLANT
DEVICE SUT QUICK LOAD TK 5 (SUTURE) IMPLANT
DEVICE SUT TI-KNOT TK 5X26 (SUTURE) IMPLANT
DEVICE SUTURE ENDOST 10MM (ENDOMECHANICALS) ×1 IMPLANT
DRAIN PENROSE 0.25X18 (DRAIN) ×1 IMPLANT
ELECT REM PT RETURN 15FT ADLT (MISCELLANEOUS) ×1 IMPLANT
GAUZE 4X4 16PLY ~~LOC~~+RFID DBL (SPONGE) ×1 IMPLANT
GLOVE BIO SURGEON STRL SZ 6 (GLOVE) ×1 IMPLANT
GLOVE INDICATOR 6.5 STRL GRN (GLOVE) ×1 IMPLANT
GOWN STRL REUS W/ TWL LRG LVL3 (GOWN DISPOSABLE) ×1 IMPLANT
GRASPER SUT TROCAR 14GX15 (MISCELLANEOUS) IMPLANT
IRRIGATION SUCT STRKRFLW 2 WTP (MISCELLANEOUS) ×1 IMPLANT
KIT BASIN OR (CUSTOM PROCEDURE TRAY) ×1 IMPLANT
KIT GASTRIC LAVAGE 34FR ADT (SET/KITS/TRAYS/PACK) ×1 IMPLANT
KIT TURNOVER KIT A (KITS) ×1 IMPLANT
MARKER SKIN DUAL TIP RULER LAB (MISCELLANEOUS) ×1 IMPLANT
MAT PREVALON FULL STRYKER (MISCELLANEOUS) ×1 IMPLANT
NDL SPNL 22GX3.5 QUINCKE BK (NEEDLE) ×1 IMPLANT
NEEDLE SPNL 22GX3.5 QUINCKE BK (NEEDLE) ×1 IMPLANT
PACK CARDIOVASCULAR III (CUSTOM PROCEDURE TRAY) ×1 IMPLANT
RELOAD STAPLE 60 2.6 WHT THN (STAPLE) ×2 IMPLANT
RELOAD STAPLE 60 3.6 BLU REG (STAPLE) ×2 IMPLANT
RELOAD STAPLE 60 3.8 GOLD REG (STAPLE) IMPLANT
RELOAD STAPLE 60 4.1 GRN THCK (STAPLE) IMPLANT
RELOAD SUT SNGL STCH ABSRB 2-0 (ENDOMECHANICALS) ×4 IMPLANT
RELOAD SUT SNGL STCH BLK 2-0 (ENDOMECHANICALS) ×4 IMPLANT
SCISSORS LAP 5X45 EPIX DISP (ENDOMECHANICALS) ×1 IMPLANT
SET TUBE SMOKE EVAC HIGH FLOW (TUBING) ×1 IMPLANT
SHEARS HARMONIC 45 ACE (MISCELLANEOUS) ×1 IMPLANT
SLEEVE ADV FIXATION 12X100MM (TROCAR) IMPLANT
SLEEVE ADV FIXATION 5X100MM (TROCAR) ×2 IMPLANT
SOLUTION ANTFG W/FOAM PAD STRL (MISCELLANEOUS) ×1 IMPLANT
STAPLER ECHELON BIOABSB 60 FLE (MISCELLANEOUS) IMPLANT
STAPLER ECHELON LONG 60 440 (INSTRUMENTS) ×1 IMPLANT
STRIP CLOSURE SKIN 1/2X4 (GAUZE/BANDAGES/DRESSINGS) ×1 IMPLANT
SUT MNCRL AB 4-0 PS2 18 (SUTURE) ×1 IMPLANT
SUT SURGIDAC NAB ES-9 0 48 120 (SUTURE) IMPLANT
SUT VIC AB 2-0 SH 27X BRD (SUTURE) ×1 IMPLANT
SUT VICRYL 0 TIES 12 18 (SUTURE) IMPLANT
SYR 20ML LL LF (SYRINGE) ×1 IMPLANT
TAPE STRIPS DRAPE STRL (GAUZE/BANDAGES/DRESSINGS) IMPLANT
TOWEL OR 17X26 10 PK STRL BLUE (TOWEL DISPOSABLE) ×1 IMPLANT
TROCAR ADV FIXATION 12X100MM (TROCAR) ×1 IMPLANT
TROCAR ADV FIXATION 5X100MM (TROCAR) ×1 IMPLANT
TROCAR XCEL NON-BLD 5MMX100MML (ENDOMECHANICALS) ×1 IMPLANT
TUBING CONNECTING 10 (TUBING) IMPLANT

## 2023-11-17 NOTE — Anesthesia Preprocedure Evaluation (Signed)
 Anesthesia Evaluation  Patient identified by MRN, date of birth, ID band Patient awake    Reviewed: Allergy  & Precautions, H&P , NPO status , Patient's Chart, lab work & pertinent test results, reviewed documented beta blocker date and time   Airway Mallampati: III  TM Distance: >3 FB Neck ROM: full    Dental  (+) Dental Advisory Given   Pulmonary neg pulmonary ROS, asthma , sleep apnea , COPD, former smoker   Pulmonary exam normal breath sounds clear to auscultation       Cardiovascular hypertension, Pt. on medications +CHF  negative cardio ROS + dysrhythmias Atrial Fibrillation  Rhythm:regular Rate:Normal     Neuro/Psych  Headaches  Anxiety Depression    negative neurological ROS  negative psych ROS   GI/Hepatic negative GI ROS, Neg liver ROS,GERD  ,,  Endo/Other  negative endocrine ROSHypothyroidism  Class 4 obesity  Renal/GU negative Renal ROS  negative genitourinary   Musculoskeletal negative musculoskeletal ROS (+)    Abdominal  (+) + obese  Peds negative pediatric ROS (+)  Hematology negative hematology ROS (+)   Anesthesia Other Findings Left ventricle:  The cavity size was normal. Wall thickness was increased in a pattern of mild LVH. Systolic function was normal. The estimated ejection fraction was in the range of 55% to 60%.  Reproductive/Obstetrics negative OB ROS                              Anesthesia Physical Anesthesia Plan  ASA: III  Anesthesia Plan: General   Post-op Pain Management:    Induction: Intravenous  PONV Risk Score and Plan: 3 and Treatment may vary due to age or medical condition, Ondansetron , Dexamethasone  and Midazolam   Airway Management Planned: Oral ETT  Additional Equipment:   Intra-op Plan:   Post-operative Plan: Extubation in OR  Informed Consent: I have reviewed the patients History and Physical, chart, labs and discussed the  procedure including the risks, benefits and alternatives for the proposed anesthesia with the patient or authorized representative who has indicated his/her understanding and acceptance.     Dental Advisory Given  Plan Discussed with: CRNA and Surgeon  Anesthesia Plan Comments: (  )         Anesthesia Quick Evaluation

## 2023-11-17 NOTE — Progress Notes (Addendum)
 PHARMACY CONSULT FOR:  Risk Assessment for Post-Discharge VTE Following Bariatric Surgery  Procedure* Roux-en-Y gastric bypass  Sex F  Black race Y  Age (years) 22  BMI (kg/m2) 54.9  Operation duration (minutes) 119  History of VTE requiring treatment* N  Hypercoagulable condition* N  Liver disorder* N  Pre-op venous stasis N  Pre-op functional health status Independent  Previous foregut or bariatric surg N  Post-op surgical site infection N  Transfusion intra- or post-op* N  Unplanned readmission N  Unplanned reoperation N  GI perforation/leak/obstruction* N  *specific risk factors for portomesenteric venous thrombosis   Predicted probability of 30-day post-discharge VTE:    0.47 % estimated using the [facility] Calculator  Other patient-specific factors to consider: Prior to holding for surgery patient had been on Eliquis  5 mg po BID for paroxysmal atrial fibrillation   Recommendation for Discharge: Enoxaparin  60 mg [place] q12h until safe to resume full-dose anticoagulation per surgeon's determination, then resume Eliquis  5 mg PO BID.   April [name] is a 53 y.o. female who underwent laparoscopic RYGB on 11/17/23   Case start: 0801 Case end:  1000   Allergies  Allergen Reactions   Other Shortness Of Breath and Other (See Comments)    Seasonal allergies- Wheezing   Contrast Media [Iodinated Contrast Media] Itching and Other (See Comments)    CT contrast    Patient Measurements: Height: 5' 4.5 (163.8 cm) Weight: (!) 147.2 kg (324 lb 9.6 oz) IBW/kg (Calculated) : 55.85 Body mass index is 54.86 kg/m.  No results for input(s): WBC, HGB, HCT, PLT, APTT, CREATININE, LABCREA, CREAT24HRUR, MG, PHOS, ALBUMIN , PROT, AST, ALT, ALKPHOS, BILITOT, BILIDIR, IBILI in the last 72 hours. Estimated Creatinine Clearance: 118.4 mL/min (by C-G formula based on SCr of 0.82 mg/dL).    Past Medical History:  Diagnosis  Date   Acanthosis nigricans    Anxiety    ARDS (adult respiratory distress syndrome) (HCC) 2019   Respiratory Failure, had Trache / ECMO at [facility]   Arthritis    knees (04/28/2017)   Asthma    Followed by Dr. Neysa (pulmonology); receives every other week omalizumab  injections; has frequent exacerbations   Back pain    Chronic diastolic CHF (congestive heart failure) (HCC) 01/17/2017   COPD (chronic obstructive pulmonary disease) (HCC)    PFTs in 2002, FEV1/FVC 65, no post bronchodilater test done   Depression    Dysrhythmia    A. Fib   GERD (gastroesophageal reflux disease)    Headache(784.0)    q couple days (04/28/2017)   Helicobacter pylori (H. pylori) infection    Hypertension, essential    Hypothyroidism    Insomnia    Joint pain    Lower extremity edema    Menorrhagia    Morbid obesity (HCC)    OSA on CPAP    Sleep study 2008 - mild OSA, not enough events to titrate CPAP; wears CPAP now/pt on 04/28/2017   Pneumonia X 1   Pre-diabetes    Rheumatoid arthritis (HCC)    Seasonal allergies    Shortness of breath    Tobacco user    Vitamin D  deficiency      Medications Prior to Admission  Medication Sig Dispense Refill Last Dose/Taking   Acetaminophen  (TYLENOL ) 325 MG CAPS Take 1,000 mg by mouth once.   11/16/2023 Evening   albuterol  (VENTOLIN  HFA) 108 (90 Base) MCG/ACT inhaler INHALE 2 PUFFS INTO THE LUNGS EVERY 6 HOURS AS NEEDED FOR WHEEZING  OR SHORTNESS OF BREATH (Patient taking differently: Inhale 1-2 puffs into the lungs every 6 (six) hours as needed for wheezing or shortness of breath.) 8.5 g 3 11/16/2023 Evening   atorvastatin  (LIPITOR) 20 MG tablet Take 1 tablet (20 mg total) by mouth daily. 90 tablet 1 11/16/2023 Evening   carvedilol  (COREG ) 6.25 MG tablet Take 1 tablet (6.25 mg total) by mouth 2 (two) times daily with a meal. (Patient taking differently: Take 6.25 mg by mouth daily.) 180 tablet 1 11/16/2023   clonazePAM  (KLONOPIN ) 0.5 MG tablet 1 or 2 tabs at  bedtime as needed for sleep (Patient taking differently: Take 0.5 mg by mouth at bedtime.) 60 tablet 5 11/16/2023 Evening   cloNIDine  (CATAPRES ) 0.2 MG tablet TAKE 1 TABLET BY MOUTH TWICE DAILY (Patient taking differently: Take 0.2 mg by mouth daily as needed (hot flashes).) 180 tablet 0 Past Week   docusate sodium  (COLACE) 100 MG capsule Take 1 capsule (100 mg total) by mouth 2 (two) times daily. (Patient taking differently: Take 100-200 mg by mouth daily as needed for mild constipation or moderate constipation.) 60 capsule 3 Past Month   Fezolinetant  (VEOZAH ) 45 MG TABS Take 1 tablet (45 mg total) by mouth daily. 90 tablet 1 11/15/2023   fluticasone  (FLONASE ) 50 MCG/ACT nasal spray 1-2 puffs each nostril at bedtime while needed (Patient taking differently: Place 1 spray into both nostrils daily as needed for allergies.) 16 g 5 Past Week   Fluticasone -Umeclidin-Vilant (TRELEGY ELLIPTA ) 200-62.5-25 MCG/ACT AEPB Inhale 1 puff into the lungs daily. 60 each 11 11/16/2023 Evening   ipratropium-albuterol  (DUONEB) 0.5-2.5 (3) MG/3ML SOLN Take 3 mLs by nebulization every 4 (four) hours as needed (shortness of breath or wheezing). (Patient taking differently: Take 3 mLs by nebulization daily as needed (shortness of breath or wheezing).) 360 mL 0 Taking Differently   levothyroxine  (SYNTHROID ) 50 MCG tablet Take 1 tablet (50 mcg total) by mouth daily before breakfast. 90 tablet 1 11/17/2023 at  4:00 AM   montelukast  (SINGULAIR ) 10 MG tablet Take 1 tablet (10 mg total) by mouth at bedtime. 30 tablet 11 11/16/2023 Bedtime   naloxone  (NARCAN ) nasal spray 4 mg/0.1 mL Place 1 spray into the nose once as needed (opioid overdose).   Taking As Needed   omeprazole  (PRILOSEC ) 40 MG capsule Take 40 mg by mouth at bedtime as needed (reflux).   11/16/2023 Evening   oxyCODONE -acetaminophen  (PERCOCET) 10-325 MG tablet Take 1 tablet by mouth 5 (five) times daily as needed for pain. (Patient taking differently: Take 1 tablet by mouth  daily as needed for pain.) 150 tablet 0 Taking Differently   Potassium Chloride  ER 20 MEQ TBCR Take 1 tablet (20 mEq total) by mouth daily. 90 tablet 1 11/16/2023   pregabalin  (LYRICA ) 75 MG capsule Take 75 mg by mouth 2 (two) times daily.   11/16/2023   RYBELSUS  3 MG TABS TAKE 1 TABLET(3 MG) BY MOUTH DAILY 30 tablet 3 Past Week   topiramate  (TOPAMAX ) 200 MG tablet Take 200 mg by mouth at bedtime.   Past Week   Vitamin D , Ergocalciferol , (DRISDOL ) 1.25 MG (50000 UNIT) CAPS capsule Take 1 capsule (50,000 Units total) by mouth every 7 (seven) days. (Patient taking differently: Take 50,000 Units by mouth every 7 (seven) days. Sunday) 12 capsule 1 11/16/2023   zolpidem  (AMBIEN ) 10 MG tablet Take 10 mg by mouth at bedtime as needed for sleep.   Past Week   amLODipine  (NORVASC ) 10 MG tablet Take 1 tablet (10 mg total) by mouth  daily. (Patient not taking: Reported on 11/13/2023) 90 tablet 1 Not Taking   ELIQUIS  5 MG TABS tablet TAKE 1 TABLET(5 MG) BY MOUTH TWICE DAILY (Patient not taking: Reported on 11/13/2023) 180 tablet 1 More than a month   furosemide  (LASIX ) 20 MG tablet TAKE 1 TABLET(20 MG) BY MOUTH DAILY (Patient not taking: Reported on 11/13/2023) 90 tablet 0 Not Taking   gabapentin  (NEURONTIN ) 300 MG capsule Take 300 mg by mouth 3 (three) times daily. (Patient not taking: Reported on 11/14/2023)   Not Taking   methocarbamol  (ROBAXIN ) 750 MG tablet Take 750 mg by mouth 3 (three) times daily. (Patient not taking: Reported on 11/13/2023)   Not Taking   solifenacin  (VESICARE ) 5 MG tablet TAKE 1 TABLET(5 MG) BY MOUTH DAILY (Patient taking differently: Take 5 mg by mouth daily as needed (spasms).) 90 tablet 1 More than a month   Tezepelumab -ekko (TEZSPIRE ) 210 MG/1. SOAJ Inject 210 mg into the skin every 28 (twenty-eight) days. 1.91 mL 5 More than a month   tiZANidine  (ZANAFLEX ) 2 MG tablet Take 2 mg by mouth 3 (three) times daily as needed. (Patient not taking: Reported on 11/13/2023)   Not Taking    Darina Garret, PharmD, BCPS Clinical Pharmacist 11/17/2023  12:30 PM

## 2023-11-17 NOTE — Progress Notes (Signed)
Discussed QI "Goals for Discharge" document with patient including ambulation in halls, Incentive Spirometry use every hour, and oral care.  Also discussed pain and nausea control.  Enabled or verified head of bed 30 degree alarm activated.  BSTOP education provided including BSTOP information guide, "Guide for Pain Management after your Bariatric Procedure".  Diet progression education provided including "Bariatric Surgery Post-Op Food Plan Phase 1: Liquids".  Questions answered.  Will continue to partner with bedside RN and follow up with patient per protocol.  

## 2023-11-17 NOTE — Op Note (Signed)
 Operative Note  April [name]  [id number]  [id number]  11/17/2023   Surgeon: Mitzie DELENA Freund MD FACS   Assistant: Herlene Bureau MD FACS   Procedure performed: laparoscopic Roux-en-Y gastric bypass ( antecolic, antegastric), upper endoscopy   Preop diagnosis: Morbid obesity Body mass index is 54.86 kg/m. Post-op diagnosis/intraop findings: same   Specimens: none Retained items: none  EBL: minimal cc Complications: none   Description of procedure: After confirming informed consent and administration of prophylactic heparin  in holding, the patient was taken to the operating room and placed supine on operating room table where general endotracheal anesthesia was initiated, preoperative antibiotics were administered, SCDs applied, and a formal timeout was performed. The abdomen was prepped and draped in usual sterile fashion. Peritoneal access was gained using a optical entry technique in the left upper quadrant and insufflation to 15 mmHg ensued without issue. Gross inspection revealed no evidence of injury. Under direct visualization the remaining trochars were inserted. A laparoscopic assisted bilateral taps block was performed using 0.25% Marcaine  with epinephrine .  The omentum was reflected cephalad and the ligament of Treitz identified. The small bowel was followed to a point 50 cm distal to ligament of Treitz at which location the bowel was divided with a white load linear cutting stapler. A Penrose was sutured to the Roux side of the staple line for future identification.  There was a point of oozing along the mesenteric border of the staple line on the Roux limb which was oversewn with a figure-of-eight 3-0 Vicryl.  The bowel was measured another 100 cm distal to this and and the site for the jejunojejunostomy was aligned with the end of the biliopancreatic limb. Enterotomies were made with the Harmonic scalpel and the anastomosis was created with the 60 mm white load linear cutting  stapler. The common enterotomy was closed with running 3-0 Vicryl starting on either end and tying centrally. The mesenteric defect was closed with running silk suture secured with Lapra-Ty's. The anastomosis was inspected and appeared widely patent, hemostatic with no gaps in the suture line.  Vistaseal  was injected over the anastomosis. We then divided the omentum using the harmonic scalpel.  The patient was then placed in steep reverse Trendelenburg. The liver retractor was inserted through a subxiphoid incision and secured for fixed retraction of the left lobe. The Harmonic scalpel was used to enter the perigastric plane and the lesser sac at a point 5 cm distal to the GE junction on the lesser curve. The angle of His was gently bluntly dissected and the target shape of the pouch visualized to exclude any residual fundus. After confirming that all tubes have been removed from the stomach, the gastric pouch was created with serial fires of the linear cutting stapler. As we approached the angle of His, the Ewald tube was inserted to confirm no impingement on the GE junction. The Roux limb with its attached Penrose drain was then identified and brought up to meet the gastric pouch ensuring no twist in the small bowel mesentery. The staple line of the small bowel is directed to the patient's left side. A running 3-0 Vicryl was used to create a posterior suture line for our anastomosis between the gastric pouch and the small bowel. Gastrotomy and enterotomy was made with the Harmonic scalpel and a blue load linear cutting stapler was used to create a gastrojejunal anastomosis approximately 3cm wide. The common enterotomy was closed with running 3-0 Vicryl starting at either end and tying centrally.  As the  case progressed, we noted that the last couple throws of this knot seemed to have come undone and so the remaining tails were cinched down and a clip was placed for further reinforcement. At this juncture the  Ewald tube was passed through the gastrojejunal anastomosis. An anterior layer of running 3-0 Vicryl was used to complete the gastrojejunal anastomosis. The ewald tube was removed without difficulty. The Hannah space was closed with a figure-of-eight silk suture.  There was some bleeding noted from a small laceration on the undersurface of the liver which was addressed with cautery.  The staple line of the pouch and excluded stomach were inspected, there was some oozing from the excluded stomach staple line which was addressed with clips. At this point the assistant performed an upper endoscopy with the Roux limb gently clamped with a bowel clamp. Irrigation was instilled in the upper abdomen for a leak test. Please see his separate operative note- the anastomosis is noted to be patent, circumferentially intact, and hemostatic without any leak or bubbles present. The endoscope was removed and the abdomen once again surveyed. The liver retractor was removed under direct visualization.  I did close the 12 mm trocar site in the right paramedian location with a single 3-0 Vicryl suture using laparoscopic suture passer under direct visualization.  The abdomen was then desufflated and all remaining trochars removed. The skin incisions were closed with subcuticular 4-0 Monocryl; benzoin, Steri-Strips and Band-Aids were applied The patient was then awakened, extubated and taken to PACU in stable condition.     All counts were correct at the completion of the case.

## 2023-11-17 NOTE — Interval H&P Note (Signed)
 History and Physical Interval Note:  11/17/2023 7:00 AM  April [name] Ar  has presented today for surgery, with the diagnosis of MORBID OBESITY.  The various methods of treatment have been discussed with the patient and family. After consideration of risks, benefits and other options for treatment, the patient has consented to  Procedure(s) with comments: LAPAROSCOPIC ROUX-EN-Y GASTRIC (N/A) - LAPAROSCOPIC GASTRIC BYPASS ENDOSCOPY, UPPER GI TRACT (N/A) as a surgical intervention.  The patient's history has been reviewed, patient examined, no change in status, stable for surgery.  I have reviewed the patient's chart and labs.  Questions were answered to the patient's satisfaction.     Amarya Kuehl DELENA Freund

## 2023-11-17 NOTE — Transfer of Care (Signed)
 Immediate Anesthesia Transfer of Care Note  Patient: April [name]  Procedure(s) Performed: LAPAROSCOPIC ROUX-EN-Y GASTRIC ENDOSCOPY, UPPER GI TRACT  Patient Location: PACU  Anesthesia Type:General  Level of Consciousness: awake and patient cooperative  Airway & Oxygen  Therapy: Patient Spontanous Breathing and Patient connected to face mask  Post-op Assessment: Report given to RN and Post -op Vital signs reviewed and stable  Post vital signs: Reviewed and stable  Last Vitals:  Vitals Value Taken Time  BP 156/81 11/17/23 10:07  Temp    Pulse 58 11/17/23 10:09  Resp 19 11/17/23 10:09  SpO2 100 % 11/17/23 10:09  Vitals shown include unfiled device data.  Last Pain:  Vitals:   11/17/23 0631  TempSrc:   PainSc: 8          Complications: No notable events documented.

## 2023-11-17 NOTE — Anesthesia Procedure Notes (Signed)
 Procedure Name: Intubation Date/Time: 11/17/2023 7:32 AM  Performed by: Judythe Tanda Aran, CRNAPre-anesthesia Checklist: Patient identified, Emergency Drugs available, Suction available and Patient being monitored Patient Re-evaluated:Patient Re-evaluated prior to induction Oxygen  Delivery Method: Circle system utilized Preoxygenation: Pre-oxygenation with 100% oxygen  Induction Type: IV induction Ventilation: Mask ventilation without difficulty Laryngoscope Size: 2 and Miller Grade View: Grade I Tube type: Oral Tube size: 7.0 mm Number of attempts: 1 Airway Equipment and Method: Stylet Placement Confirmation: ETT inserted through vocal cords under direct vision, positive ETCO2 and breath sounds checked- equal and bilateral Secured at: 21 cm Tube secured with: Tape Dental Injury: Teeth and Oropharynx as per pre-operative assessment

## 2023-11-17 NOTE — Anesthesia Postprocedure Evaluation (Signed)
 Anesthesia Post Note  Patient: April [name]  Procedure(s) Performed: LAPAROSCOPIC ROUX-EN-Y GASTRIC ENDOSCOPY, UPPER GI TRACT     Patient location during evaluation: PACU Anesthesia Type: General Level of consciousness: awake and alert Pain management: pain level controlled Vital Signs Assessment: post-procedure vital signs reviewed and stable Respiratory status: spontaneous breathing, nonlabored ventilation and respiratory function stable Cardiovascular status: blood pressure returned to baseline and stable Postop Assessment: no apparent nausea or vomiting Anesthetic complications: no   No notable events documented.  Last Vitals:  Vitals:   11/17/23 1100 11/17/23 1128  BP: (!) 147/76 (!) 140/91  Pulse: 62 (!) 59  Resp: 19 15  Temp:  36.7 C  SpO2: 92% 100%    Last Pain:  Vitals:   11/17/23 1128  TempSrc: Oral  PainSc: 10-Worst pain ever                 Butler Levander Pinal

## 2023-11-18 ENCOUNTER — Encounter (HOSPITAL_COMMUNITY): Payer: Self-pay | Admitting: Surgery

## 2023-11-18 ENCOUNTER — Other Ambulatory Visit: Payer: Self-pay | Admitting: Family Medicine

## 2023-11-18 LAB — CBC
HCT: 38.8 % (ref 36.0–46.0)
Hemoglobin: 12.2 g/dL (ref 12.0–15.0)
MCH: 27.2 pg (ref 26.0–34.0)
MCHC: 31.4 g/dL (ref 30.0–36.0)
MCV: 86.6 fL (ref 80.0–100.0)
Platelets: 308 K/uL (ref 150–400)
RBC: 4.48 MIL/uL (ref 3.87–5.11)
RDW: 16.8 % — ABNORMAL HIGH (ref 11.5–15.5)
WBC: 11.2 K/uL — ABNORMAL HIGH (ref 4.0–10.5)
nRBC: 0 % (ref 0.0–0.2)

## 2023-11-18 LAB — COMPREHENSIVE METABOLIC PANEL WITH GFR
ALT: 28 U/L (ref 0–44)
AST: 33 U/L (ref 15–41)
Albumin: 3 g/dL — ABNORMAL LOW (ref 3.5–5.0)
Alkaline Phosphatase: 60 U/L (ref 38–126)
Anion gap: 7 (ref 5–15)
BUN: 10 mg/dL (ref 6–20)
CO2: 23 mmol/L (ref 22–32)
Calcium: 7.9 mg/dL — ABNORMAL LOW (ref 8.9–10.3)
Chloride: 107 mmol/L (ref 98–111)
Creatinine, Ser: 0.63 mg/dL (ref 0.44–1.00)
GFR, Estimated: >60 mL/min (ref >60)
Glucose, Bld: 95 mg/dL (ref 70–99)
Potassium: 3.5 mmol/L (ref 3.5–5.1)
Sodium: 137 mmol/L (ref 135–145)
Total Bilirubin: 0.5 mg/dL (ref 0.0–1.2)
Total Protein: 6.6 g/dL (ref 6.5–8.1)

## 2023-11-18 LAB — MAGNESIUM: Magnesium: 2.2 mg/dL (ref 1.7–2.4)

## 2023-11-18 MED ORDER — SODIUM CHLORIDE 0.9 % IV SOLN: INTRAVENOUS | Status: DC

## 2023-11-18 MED ORDER — POTASSIUM CHLORIDE 10 MEQ/100ML IV SOLN
10.0000 meq | INTRAVENOUS | Status: DC
  Filled 2023-11-18: qty 100

## 2023-11-18 MED ORDER — POTASSIUM CHLORIDE 20 MEQ PO PACK
40.0000 meq | PACK | Freq: Once | ORAL | Status: AC
  Administered 2023-11-18: 40 meq via ORAL
  Filled 2023-11-18: qty 2

## 2023-11-18 MED ORDER — METHOCARBAMOL 500 MG PO TABS
500.0000 mg | ORAL_TABLET | Freq: Four times a day (QID) | ORAL | Status: DC | PRN
  Administered 2023-11-18 (×2): 500 mg via ORAL
  Filled 2023-11-18 (×2): qty 1

## 2023-11-18 NOTE — Progress Notes (Signed)
 S: No acute events overnight.  Patient did walk some yesterday evening.  She is tolerating water  and has started protein without any reported issues.  Notes pain in the right paramedian abdomen as well as in her upper chest.  She is requesting social work consult as she is very anxious about going home and is wondering if someone could be arranged to come check on her.  See outpatient phone notes between her and her primary care team on 7/8 and 7/14.  O: Vitals, labs, intake/output, and orders reviewed at this time. Afebrile, no tachycardia, hypertensive. Normal RR and sats 99% RA. PO 600, UOP 900 + 3 occurrences. CMP unremarkable- K+ 3.5. WBC 11.2 (11.3 preop), Hgb 12.2 (12.2 preop), plt 308 (363 preop)  PRN meds since arrival to floor- tramadol  x 1, simethicone  x 1, oxycodone  x 3, zofran  x 1, robaxin  x 1,   Gen: A&Ox3, no distress  Chest: unlabored respirations, RRR Abd: soft, mildly diffusely tender, nondistended, incision(s) c/d/i with steri strips/bandaids, no cellulitis or hematoma Ext: warm, no edema Neuro: grossly normal  Lines/tubes/drains: PIV  A/P: Postop day 1 status post laparoscopic Roux-en-Y gastric bypass - Continue clear liquids and protein shakes - Continue frequent ambulation, SCDs while in bed, aggressive pulmonary toilet - Discussed with her that after laparoscopic surgery I expect she should be able to do pretty much everything independently and may not qualify for any type of home health but I am happy to consult TOC for their input.  Will see how the day goes today.  So far she appears to be doing well from an objective standpoint.   Mitzie Freund, MD [facility], [place]

## 2023-11-18 NOTE — TOC Initial Note (Signed)
 Transition of Care [facility]) - Initial/Assessment Note    Patient Details  Name: April [name] MRN: [id number] Date of Birth: Jul 09, 1970  Transition of Care [facility]) CM/SW Contact:    Heather DELENA Saltness, LCSW Phone Number: 11/18/2023, 12:55 PM  Clinical Narrative:                 [facility] consulted for assistance with home health services, per pt's request. CSW met with pt at bedside to discuss discharge planning. Pt inquired about receiving HH services upon discharge. CSW explained that PT would need to evaluate pt in hospital and recommend [facility] services. CSW advised pt that she would need to qualify for a skilled service (PT/OT/RN) to receive [facility] services. CSW advised pt she will reach out to MD. CSW advised pt that if not recommended for skilled service, she will need to pay out-of-pocket for home care. Pt verbalized understanding.    Expected Discharge Plan: Home/Self Care Barriers to Discharge: Continued Medical Work up   Patient Goals and CMS Choice Patient states their goals for this hospitalization and ongoing recovery are:: To return home        Expected Discharge Plan and Services In-house Referral: Clinical Social Work   Post Acute Care Choice: NA Living arrangements for the past 2 months: Apartment                 DME Arranged: N/A DME Agency: NA       HH Arranged: NA HH Agency: NA        Prior Living Arrangements/Services Living arrangements for the past 2 months: Apartment Lives with:: Self Patient language and need for interpreter reviewed:: Yes Do you feel safe going back to the place where you live?: Yes      Need for Family Participation in Patient Care: Yes (Comment) Care giver support system in place?: No (comment)   Criminal Activity/Legal Involvement Pertinent to Current Situation/Hospitalization: No - Comment as needed  Activities of Daily Living   ADL Screening (condition at time of admission) Independently performs ADLs?: Yes (appropriate for  developmental age) Is the patient deaf or have difficulty hearing?: No Does the patient have difficulty seeing, even when wearing glasses/contacts?: Yes Does the patient have difficulty concentrating, remembering, or making decisions?: No  Permission Sought/Granted   Permission granted to share information with : No              Emotional Assessment Appearance:: Appears stated age, Well-Groomed Attitude/Demeanor/Rapport: Engaged Affect (typically observed): Pleasant, Appropriate, Stable Orientation: : Oriented to Self, Oriented to Place, Oriented to  Time, Oriented to Situation Alcohol / Substance Use: Not Applicable Psych Involvement: No (comment)  Admission diagnosis:  Morbid obesity (HCC) [E66.01] Patient Active Problem List   Diagnosis Date Noted   Morbid obesity (HCC) 11/17/2023   Asthma exacerbation 05/29/2023   Acute on chronic diastolic (congestive) heart failure (HCC) 05/20/2023   Moderate episode of recurrent major depressive disorder (HCC) 05/20/2023   COPD exacerbation (HCC) 12/05/2022   Acute asthma exacerbation 07/19/2022   Primary osteoarthritis of left knee 06/11/2022   Asthma with COPD with exacerbation (HCC) 04/28/2022   Chronic pain syndrome    Hypothyroidism 08/23/2021   Chronic heart failure with preserved ejection fraction (HFpEF) (HCC) 08/23/2021   Primary osteoarthritis of right knee 07/04/2021   Body mass index 50.0-59.9, adult (HCC) 07/04/2021   Painful orthopaedic hardware (HCC) 07/04/2021   Severe persistent asthma with exacerbation 07/04/2020   Hot flashes    Asthma-COPD overlap syndrome (HCC) 10/26/2018  Vitamin D  deficiency 05/26/2018   Urinary incontinence 11/17/2017   Physical deconditioning 10/25/2017   Debility 09/22/2017   Acute on chronic respiratory failure with hypoxemia (HCC) 08/05/2017   Chronic atrial fibrillation 07/29/2017   AF (paroxysmal atrial fibrillation) (HCC)    Dyspnea 02/04/2017   Moderate persistent asthma with  exacerbation 10/19/2016   Hypochromic anemia 10/02/2016   Elevated hemoglobin A1c 05/05/2016   GERD (gastroesophageal reflux disease) 08/30/2015   Anxiety and depression 08/30/2015   Generalized anxiety disorder 08/30/2015   Hyperlipidemia 12/08/2013   Seasonal allergic rhinitis 08/29/2013   Tobacco abuse in remission 10/07/2012   Chronic obstructive pulmonary disease with acute exacerbation (HCC) 05/07/2012   Knee pain, bilateral 04/25/2011   Insomnia 03/14/2011   Obstructive sleep apnea 12/19/2010   Hypokalemia 08/13/2010   Cervical back pain with evidence of disc disease 04/08/2008   Essential hypertension 07/31/2006   Morbid obesity due to excess calories (HCC) 06/17/2006   Major depressive disorder, recurrent episode (HCC) 04/10/2006   PCP:  Delbert Clam, MD Pharmacy:   [facility] [phone] - [place], [place] - [address] AT [facility] OF [address] [place] [id number] Phone: [phone] Fax: [phone]     Social Drivers of Health (SDOH) Social History: SDOH Screenings   Food Insecurity: No Food Insecurity (11/17/2023)  Housing: Low Risk  (11/17/2023)  Transportation Needs: No Transportation Needs (11/17/2023)  Utilities: Not At Risk (11/17/2023)  Alcohol Screen: Low Risk  (11/20/2022)  Depression (PHQ2-9): Medium Risk (08/01/2023)  Financial Resource Strain: Medium Risk (11/20/2022)  Physical Activity: Insufficiently Active (11/20/2022)  Social Connections: Socially Isolated (11/20/2022)  Stress: Stress Concern Present (11/20/2022)  Tobacco Use: Medium Risk (11/17/2023)  Health Literacy: Adequate Health Literacy (11/20/2022)   SDOH Interventions: None indicated     Readmission Risk Interventions    11/18/2023   12:32 PM 01/24/2023   11:12 AM 12/07/2022   10:59 AM  Readmission Risk Prevention Plan  Transportation Screening Complete Complete Complete  PCP or Specialist Appt within 5-7 Days Complete    Home Care  Screening Complete    Medication Review (RN CM) Complete    Medication Review (RN Care Manager)   Complete  PCP or Specialist appointment within 3-5 days of discharge  Complete Complete  HRI or Home Care Consult  Complete Complete  SW Recovery Care/Counseling Consult  Complete Complete  Palliative Care Screening  Not Applicable Not Applicable  Skilled Nursing Facility  Not Applicable Not Applicable    Heather Saltness, MSW, LCSW 11/18/2023 1:17 PM

## 2023-11-18 NOTE — Progress Notes (Signed)
 Patient alert and oriented, pain is controlled. Patient is tolerating fluids, advanced to protein shake today, patient is tolerating well. Reviewed Gastric sleeve/bypass discharge instructions with patient and patient is able to articulate understanding. Provided information on BELT program, Support Group, BSTOP-D, and [facility] outpatient pharmacy. Communicated general update of patient status to surgeon. All questions answered. 24hr fluid recall is 520 mL per hydration protocol, bariatric nurse coordinator to make follow-up phone call within one week.  Lovenox  education kit has been reviewed with patient.   Thank you,  Roseann Medley, RN, MSN Bariatric Nurse Coordinator [phone] (office)

## 2023-11-19 ENCOUNTER — Other Ambulatory Visit (HOSPITAL_COMMUNITY): Payer: Self-pay

## 2023-11-19 ENCOUNTER — Other Ambulatory Visit (HOSPITAL_BASED_OUTPATIENT_CLINIC_OR_DEPARTMENT_OTHER): Payer: Self-pay

## 2023-11-19 ENCOUNTER — Other Ambulatory Visit: Payer: Self-pay

## 2023-11-19 ENCOUNTER — Encounter: Payer: Self-pay | Admitting: Internal Medicine

## 2023-11-19 ENCOUNTER — Ambulatory Visit

## 2023-11-19 ENCOUNTER — Telehealth (HOSPITAL_COMMUNITY): Payer: Self-pay | Admitting: Pharmacy Technician

## 2023-11-19 MED ORDER — APIXABAN 5 MG PO TABS
5.0000 mg | ORAL_TABLET | Freq: Two times a day (BID) | ORAL | 1 refills | Status: AC
  Filled 2023-11-19: qty 60, 30d supply, fill #0

## 2023-11-19 MED ORDER — METHOCARBAMOL 500 MG PO TABS
500.0000 mg | ORAL_TABLET | Freq: Four times a day (QID) | ORAL | 0 refills | Status: AC | PRN
  Filled 2023-11-19: qty 20, 5d supply, fill #0

## 2023-11-19 MED ORDER — ONDANSETRON 4 MG PO TBDP
4.0000 mg | ORAL_TABLET | Freq: Four times a day (QID) | ORAL | 0 refills | Status: AC | PRN
  Filled 2023-11-19: qty 20, 5d supply, fill #0

## 2023-11-19 MED ORDER — DOCUSATE SODIUM 100 MG PO CAPS
100.0000 mg | ORAL_CAPSULE | Freq: Two times a day (BID) | ORAL | 0 refills | Status: AC
  Filled 2023-11-19: qty 30, 15d supply, fill #0

## 2023-11-19 MED ORDER — ACETAMINOPHEN 500 MG PO TABS: 1000.0000 mg | ORAL_TABLET | Freq: Three times a day (TID) | ORAL | Status: AC

## 2023-11-19 MED ORDER — TRAMADOL HCL 50 MG PO TABS
50.0000 mg | ORAL_TABLET | Freq: Four times a day (QID) | ORAL | 0 refills | Status: DC | PRN
  Filled 2023-11-19: qty 5, 2d supply, fill #0

## 2023-11-19 MED ORDER — GABAPENTIN 100 MG PO CAPS
100.0000 mg | ORAL_CAPSULE | Freq: Two times a day (BID) | ORAL | 0 refills | Status: DC
  Filled 2023-11-19: qty 10, 5d supply, fill #0

## 2023-11-19 MED ORDER — PANTOPRAZOLE SODIUM 40 MG PO TBEC
40.0000 mg | DELAYED_RELEASE_TABLET | Freq: Every day | ORAL | 0 refills | Status: DC
  Filled 2023-11-19: qty 90, 90d supply, fill #0

## 2023-11-19 NOTE — Discharge Summary (Signed)
 Physician Discharge Summary  April [name] FMW:[id number] DOB: 1970-08-03 DOA: 11/17/2023  PCP: Delbert Clam, MD  Admit date: 11/17/2023 Discharge date: 11/19/2023  Recommendations for Outpatient Follow-up:     Follow-up Information     Delbert Clam, MD Follow up.   Specialty: Family Medicine Why: Follow up with PCP within 1 week of discharge to review medications and make any necessary adjustments Contact information: [address] [place] [address] [phone]                Discharge Diagnoses:  Active Problems:   Morbid obesity (HCC)   Surgical Procedure: Laparoscopic Roux-en-Y gastric bypass, upper endoscopy  Discharge Condition: Good Disposition: Home  Diet recommendation: Postoperative gastric bypass diet  Filed Weights   11/17/23 0613 11/17/23 0631  Weight: (!) 147.2 kg (!) 147.2 kg     Hospital Course:  The patient was admitted for a planned laparoscopic Roux-en-Y gastric bypass. Please see operative note. Preoperatively the patient was given 5000 units of subcutaneous heparin  for DVT prophylaxis. ERAS protocol was used. Postoperative prophylactic heparin  dosing was started on the evening of postoperative day 0.  The patient was started on ice chips and water  on the evening of POD 0 which they tolerated. On postoperative day 1 The patient's diet was advanced to protein shakes which they also tolerated. On POD 2, The patient was ambulating without difficulty. Their vital signs are stable without fever or tachycardia.. The patient had received discharge instructions and counseling. They were deemed stable for discharge.  BP (!) 155/84 (BP Location: Left Arm) Comment: notify to the nurse.  Pulse 65   Temp 97.9 F (36.6 C) (Oral)   Resp 16   Ht 5' 4.5 (1.638 m)   Wt (!) 147.2 kg   LMP 11/10/2023 (Exact Date)   SpO2 100%   BMI 54.86 kg/m    Exam- see rounding note  Discharge Instructions   Allergies as of 11/19/2023        Reactions   Other Shortness Of Breath, Other (See Comments)   Seasonal allergies- Wheezing   Contrast Media [iodinated Contrast Media] Itching, Other (See Comments)   CT contrast        Medication List     STOP taking these medications    furosemide  20 MG tablet Commonly known as: LASIX    omeprazole  40 MG capsule Commonly known as: PRILOSEC    oxyCODONE -acetaminophen  10-325 MG tablet Commonly known as: PERCOCET   Potassium Chloride  ER 20 MEQ Tbcr   Rybelsus  3 MG Tabs Generic drug: Semaglutide    tiZANidine  2 MG tablet Commonly known as: ZANAFLEX    Tylenol  325 MG Caps Generic drug: Acetaminophen  Replaced by: acetaminophen  500 MG tablet       TAKE these medications    acetaminophen  500 MG tablet Commonly known as: TYLENOL  Take 2 tablets (1,000 mg total) by mouth every 8 (eight) hours for 5 days. Replaces: Tylenol  325 MG Caps   albuterol  108 (90 Base) MCG/ACT inhaler Commonly known as: VENTOLIN  HFA INHALE 2 PUFFS INTO THE LUNGS EVERY 6 HOURS AS NEEDED FOR WHEEZING OR SHORTNESS OF BREATH What changed: how much to take   amLODipine  10 MG tablet Commonly known as: NORVASC  Take 1 tablet (10 mg total) by mouth daily.   atorvastatin  20 MG tablet Commonly known as: LIPITOR Take 1 tablet (20 mg total) by mouth daily.   carvedilol  6.25 MG tablet Commonly known as: COREG  Take 1 tablet (6.25 mg total) by mouth 2 (two) times daily with a  meal. What changed: when to take this   clonazePAM  0.5 MG tablet Commonly known as: KLONOPIN  1 or 2 tabs at bedtime as needed for sleep What changed:  how much to take how to take this when to take this additional instructions   cloNIDine  0.2 MG tablet Commonly known as: CATAPRES  TAKE 1 TABLET BY MOUTH TWICE DAILY What changed:  when to take this reasons to take this   docusate sodium  100 MG capsule Commonly known as: Colace Take 1 capsule (100 mg total) by mouth 2 (two) times daily. Okay to decrease to once daily or  stop taking if having loose bowel movements What changed: additional instructions   Eliquis  5 MG Tabs tablet Generic drug: apixaban  Take 1 tablet (5 mg total) by mouth 2 (two) times daily. What changed: See the new instructions.   fluticasone  50 MCG/ACT nasal spray Commonly known as: FLONASE  1-2 puffs each nostril at bedtime while needed What changed:  how much to take how to take this when to take this reasons to take this additional instructions   gabapentin  100 MG capsule Commonly known as: NEURONTIN  Take 1 capsule (100 mg total) by mouth every 12 (twelve) hours for 5 days. What changed:  medication strength how much to take when to take this   ipratropium-albuterol  0.5-2.5 (3) MG/3ML Soln Commonly known as: DUONEB Take 3 mLs by nebulization every 4 (four) hours as needed (shortness of breath or wheezing). What changed: when to take this   levothyroxine  50 MCG tablet Commonly known as: SYNTHROID  Take 1 tablet (50 mcg total) by mouth daily before breakfast.   methocarbamol  500 MG tablet Commonly known as: ROBAXIN  Take 1 tablet (500 mg total) by mouth every 6 (six) hours as needed for up to 5 days for muscle spasms (pain). What changed:  medication strength how much to take when to take this reasons to take this   montelukast  10 MG tablet Commonly known as: SINGULAIR  Take 1 tablet (10 mg total) by mouth at bedtime.   naloxone  4 MG/0.1ML Liqd nasal spray kit Commonly known as: NARCAN  Place 1 spray into the nose once as needed (opioid overdose).   ondansetron  4 MG disintegrating tablet Commonly known as: ZOFRAN -ODT Take 1 tablet (4 mg total) by mouth every 6 (six) hours as needed for nausea or vomiting.   pantoprazole  40 MG tablet Commonly known as: PROTONIX  Take 1 tablet (40 mg total) by mouth daily. Take this medication daily, regardless of reflux symptoms   pregabalin  75 MG capsule Commonly known as: LYRICA  Take 75 mg by mouth 2 (two) times daily.    solifenacin  5 MG tablet Commonly known as: VESICARE  TAKE 1 TABLET(5 MG) BY MOUTH DAILY What changed: See the new instructions.   Tezspire  210 MG/1. Soaj Generic drug: Tezepelumab -ekko Inject 210 mg into the skin every 28 (twenty-eight) days.   topiramate  200 MG tablet Commonly known as: TOPAMAX  Take 200 mg by mouth at bedtime.   traMADol  50 MG tablet Commonly known as: ULTRAM  Take 1 tablet (50 mg total) by mouth every 6 (six) hours as needed for moderate pain (pain score 4-6) or severe pain (pain score 7-10).   Trelegy Ellipta  200-62.5-25 MCG/ACT Aepb Generic drug: Fluticasone -Umeclidin-Vilant Inhale 1 puff into the lungs daily.   Veozah  45 MG Tabs Generic drug: Fezolinetant  Take 1 tablet (45 mg total) by mouth daily.   Vitamin D  (Ergocalciferol ) 1.25 MG (50000 UNIT) Caps capsule Commonly known as: DRISDOL  Take 1 capsule (50,000 Units total) by mouth every 7 (seven)  days. What changed: additional instructions   zolpidem  10 MG tablet Commonly known as: AMBIEN  Take 10 mg by mouth at bedtime as needed for sleep.        Follow-up Information     Delbert Clam, MD Follow up.   Specialty: Family Medicine Why: Follow up with PCP within 1 week of discharge to review medications and make any necessary adjustments Contact information: [address] [place] [address] [place] [address] [phone]                  The results of significant diagnostics from this hospitalization (including imaging, microbiology, ancillary and laboratory) are listed below for reference.    Significant Diagnostic Studies: No results found.  Labs: Basic Metabolic Panel: Recent Labs  Lab 11/14/23 1146 11/18/23 0443  NA 138 137  K 4.1 3.5  CL 108 107  CO2 24 23  GLUCOSE 88 95  BUN 10 10  CREATININE 0.82 0.63  CALCIUM  8.7* 7.9*  MG  --  2.2   Liver Function Tests: Recent Labs  Lab 11/14/23 1146 11/18/23 0443  AST 13* 33  ALT 11 28  ALKPHOS 71 60  BILITOT  0.3 0.5  PROT 7.1 6.6  ALBUMIN  3.2* 3.0*    CBC: Recent Labs  Lab 11/14/23 1146 11/18/23 0443  WBC 11.3* 11.2*  NEUTROABS 6.4  --   HGB 12.2 12.2  HCT 39.5 38.8  MCV 89.6 86.6  PLT 363 308    CBG: No results for input(s): GLUCAP in the last 168 hours.  Active Problems:   Morbid obesity (HCC)   Signed: Mitzie DELENA Freund  MD [facility] [phone] 11/19/2023, 1:41 PM

## 2023-11-19 NOTE — Evaluation (Signed)
 Physical Therapy Evaluation Only Patient Details Name: April [name] MRN: [id number] DOB: [birth year] Today's Date: 11/19/2023  History of Present Illness  April [name] s/p laparoscopic Roux-en-Y gastric bypass ( antecolic, antegastric), upper endoscopy 11/17/23. PMH: anxiety, asthma, COPD, CHF, GERD, HTN, morbid obesity, RA  Clinical Impression  Pt reports ind at baseline, using SPC sometimes on the 2 steps to enter level apartment, has rollator but doesn't use it, also has w/c but doesn't use it because it doesn't fit in apartment. Pt reports ind with ADLs/IADLs, reports one of her daughters plans to stay with her or check on her daily. On eval, pt denies numbness/tingling throughout BLE, modified ind with bed mobility needing slightly increased time to mobilize to EOB. Pt completes transfers and amb 80 ft without AD, good steadiness, able to clear obstacles, increased alteral weight shifting and wide BOS. Educated pt progressing mobility as able at home and pt verbalizes understanding. Pt going to restroom at EOS; RN reports pt has ambulated in hallway ind earlier today and has been going to the restroom ind. No acute PT needs identified, will sign off at this time.      If plan is discharge home, recommend the following:     Can travel by private vehicle        Equipment Recommendations None recommended by PT  Recommendations for Other Services       Functional Status Assessment Patient has not had a recent decline in their functional status     Precautions / Restrictions Restrictions Weight Bearing Restrictions Per Provider Order: No      Mobility  Bed Mobility Overal bed mobility: Modified Independent             General bed mobility comments: slightly increased time    Transfers Overall transfer level: Independent Equipment used: None                    Ambulation/Gait Ambulation/Gait assistance: Independent Gait Distance (Feet): 80 Feet Assistive  device: None Gait Pattern/deviations: Wide base of support, Step-through pattern Gait velocity: slightly decreased     General Gait Details: step through gait pattern with wide BOS and increased lateral weight shifting, good steadiness, completes turns and navigates obstacles in room and hallway  Stairs            Wheelchair Mobility     Tilt Bed    Modified Rankin (Stroke Patients Only)       Balance Overall balance assessment: No apparent balance deficits (not formally assessed)                                           Pertinent Vitals/Pain Pain Assessment Pain Assessment: 0-10 Pain Score: 8  Pain Location: abdomen/surgical sites Pain Descriptors / Indicators: Sharp, Aching (comes and goes) Pain Intervention(s): Limited activity within patient's tolerance, Monitored during session, Premedicated before session, Repositioned    Home Living Family/patient expects to be discharged to:: Private residence Living Arrangements: Alone Available Help at Discharge: Available PRN/intermittently;Family Type of Home: Apartment Home Access: Stairs to enter Entrance Stairs-Rails: None Entrance Stairs-Number of Steps: 2   Home Layout: One level Home Equipment: Rollator (4 wheels);Cane - single point;Wheelchair - manual Additional Comments: w/c does not fit in apartment, it is in storage    Prior Function Prior Level of Function : Independent/Modified Independent  Mobility Comments: pt reports using cane sometimes on steps, pt reports ind with community amb and no AD, pt reports 2 falls in last 6 months - falling out of bed and tripping in kitchen without shoes on ADLs Comments: pt reports ind with ADLs/IADLs     Extremity/Trunk Assessment   Upper Extremity Assessment Upper Extremity Assessment: Overall WFL for tasks assessed    Lower Extremity Assessment Lower Extremity Assessment: Overall WFL for tasks assessed    Cervical /  Trunk Assessment Cervical / Trunk Assessment: Normal  Communication   Communication Communication: No apparent difficulties    Cognition Arousal: Alert Behavior During Therapy: WFL for tasks assessed/performed   PT - Cognitive impairments: No apparent impairments                         Following commands: Intact       Cueing       General Comments      Exercises     Assessment/Plan    PT Assessment Patient does not need any further PT services  PT Problem List         PT Treatment Interventions      PT Goals (Current goals can be found in the Care Plan section)  Acute Rehab PT Goals Patient Stated Goal: no more pain PT Goal Formulation: All assessment and education complete, DC therapy    Frequency       Co-evaluation               AM-PAC PT 6 Clicks Mobility  Outcome Measure Help needed turning from your back to your side while in a flat bed without using bedrails?: None Help needed moving from lying on your back to sitting on the side of a flat bed without using bedrails?: None Help needed moving to and from a bed to a chair (including a wheelchair)?: None Help needed standing up from a chair using your arms (e.g., wheelchair or bedside chair)?: None Help needed to walk in hospital room?: None Help needed climbing 3-5 steps with a railing? : None 6 Click Score: 24    End of Session   Activity Tolerance: Patient tolerated treatment well Patient left:  (pt walking to bathroom, RN notified and reports pt has been ind in room) Nurse Communication: Mobility status PT Visit Diagnosis: Muscle weakness (generalized) (M62.81)    Time: 9055-9041 PT Time Calculation (min) (ACUTE ONLY): 14 min   Charges:   PT Evaluation $PT Eval Low Complexity: 1 Low   PT General Charges $$ ACUTE PT VISIT: 1 Visit         April [name] PT, DPT 11/19/23, 10:05 AM

## 2023-11-19 NOTE — TOC Transition Note (Signed)
 Transition of Care [facility]) - Discharge Note   Patient Details  Name: April [name] MRN: [id number] Date of Birth: [birth year]  Transition of Care [facility] CM/SW Contact:  Heather DELENA Saltness, LCSW Phone Number: 11/19/2023, 11:49 AM   Clinical Narrative:    Pt evaluated by PT. No home health services recommended by PT. Home care/private pay caregiver resources added to AVS. No further TOC needs at this time.   Final next level of care: Home/Self Care Barriers to Discharge: Barriers Resolved   Patient Goals and CMS Choice Patient states their goals for this hospitalization and ongoing recovery are:: To return home        Discharge Placement  Home              Patient to be transferred to facility by: Family/Friend Name of family member notified: Patient Patient and family notified of of transfer: 11/19/23  Discharge Plan and Services Additional resources added to the After Visit Summary for   In-house Referral: Clinical Social Work   Post Acute Care Choice: NA          DME Arranged: N/A DME Agency: NA       HH Arranged: NA HH Agency: NA        Social Drivers of Health (SDOH) Interventions SDOH Screenings   Food Insecurity: No Food Insecurity (11/17/2023)  Housing: Low Risk  (11/17/2023)  Transportation Needs: No Transportation Needs (11/17/2023)  Utilities: Not At Risk (11/17/2023)  Alcohol Screen: Low Risk  (11/20/2022)  Depression (PHQ2-9): Medium Risk (08/01/2023)  Financial Resource Strain: Medium Risk (11/20/2022)  Physical Activity: Insufficiently Active (11/20/2022)  Social Connections: Socially Isolated (11/20/2022)  Stress: Stress Concern Present (11/20/2022)  Tobacco Use: Medium Risk (11/17/2023)  Health Literacy: Adequate Health Literacy (11/20/2022)     Readmission Risk Interventions    11/18/2023   12:32 PM 01/24/2023   11:12 AM 12/07/2022   10:59 AM  Readmission Risk Prevention Plan  Transportation Screening Complete Complete Complete  PCP or Specialist Appt  within 5-7 Days Complete    Home Care Screening Complete    Medication Review (RN CM) Complete    Medication Review (RN Care Manager)   Complete  PCP or Specialist appointment within 3-5 days of discharge  Complete Complete  HRI or Home Care Consult  Complete Complete  SW Recovery Care/Counseling Consult  Complete Complete  Palliative Care Screening  Not Applicable Not Applicable  Skilled Nursing Facility  Not Applicable Not Applicable     Heather Saltness, MSW, LCSW Clinical Social Worker Inpatient Care Management 11/19/2023 11:50 AM

## 2023-11-19 NOTE — Evaluation (Signed)
 Occupational Therapy Evaluation Patient Details Name: JANEESE MCGLOIN MRN: [id number] DOB: 1970-07-20 Today's Date: 11/19/2023   History of Present Illness   April JADELYN ELKS is a 53 yr old female who is s/p a laparoscopic Roux-en-Y gastric bypass on 11-21-2023 ( antecolic, antegastric). PMH: anxiety, asthma, COPD, CHF, GERD, HTN, morbid obesity, RA     Clinical Impressions The pt performed all assessed functional tasks with modified independence or better, including supine to sit, lower body dressing, sit to stand, and ambulating in her room without an assistive device. All needed OT education and/or intervention was provided during the session. She presented with good understanding, recall, and teach back abilities. She does not require further OT services. OT will sign off and recommend she return home at discharge.      If plan is discharge home, recommend the following:   Other (comment) (N/A)     Functional Status Assessment   Patient has not had a recent decline in their functional status     Equipment Recommendations   None recommended by OT     Recommendations for Other Services         Precautions/Restrictions   Restrictions Weight Bearing Restrictions Per Provider Order: No     Mobility Bed Mobility Overal bed mobility: Modified Independent Bed Mobility: Supine to Sit     Supine to sit: Modified independent (Device/Increase time)          Transfers Overall transfer level: Independent Equipment used: None Transfers: Sit to/from Stand                    Balance Overall balance assessment: No apparent balance deficits (not formally assessed)       ADL either performed or assessed with clinical judgement   ADL Overall ADL's : Modified independent;Independent      General ADL Comments: The pt performed all assessed tasks with modified independence or better. OT instructed her on the option for implementing the figure 4 technique vs. use of  a reacher to perform lower body dressing tasks, to protect abdominal surgical site and to promote adequate healing. The pt also reported having a raised toilet at home, with OT then reinforcing use of the toilet riser may facilitate improved ease with toilet transfers.     Vision   Additional Comments: She correctly read the time depicted on the wall clock.            Pertinent Vitals/Pain Pain Assessment Pain Assessment: 0-10 Pain Score: 7  Pain Location: chest pain Pain Intervention(s): Limited activity within patient's tolerance, Monitored during session     Extremity/Trunk Assessment Upper Extremity Assessment Upper Extremity Assessment: Right hand dominant;Overall [facility] for tasks assessed   Lower Extremity Assessment Lower Extremity Assessment: Overall WFL for tasks assessed     Communication Communication Communication: No apparent difficulties   Cognition Arousal: Alert Behavior During Therapy: WFL for tasks assessed/performed               OT - Cognition Comments: Oriented x4                 Following commands: Intact                  Home Living Family/patient expects to be discharged to:: Private residence Living Arrangements: Alone Available Help at Discharge: Available PRN/intermittently;Family Type of Home: Apartment Home Access: Stairs to enter [employer] of Steps: 2 Entrance Stairs-Rails: None Home Layout: One level     Bathroom Shower/Tub:  Tub/shower unit   Bathroom Toilet: Standard     Home Equipment: Rollator (4 wheels);Wheelchair - manual   Additional Comments: w/c does not fit in apartment, it is in storage      Prior Functioning/Environment Prior Level of Function : Independent/Modified Independent;Driving             Mobility Comments:  (Independent with ambulation.) ADLs Comments:  (Independent with ADLs, cooking, cleaning, and driving.)    OT Problem List: Pain   OT Treatment/Interventions:    N/A     OT Goals(Current goals can be found in the care plan section)   Acute Rehab OT Goals OT Goal Formulation: All assessment and education complete, DC therapy   OT Frequency:   N/A       AM-PAC OT 6 Clicks Daily Activity     Outcome Measure Help from another person eating meals?: None Help from another person taking care of personal grooming?: None Help from another person toileting, which includes using toliet, bedpan, or urinal?: None Help from another person bathing (including washing, rinsing, drying)?: None Help from another person to put on and taking off regular upper body clothing?: None Help from another person to put on and taking off regular lower body clothing?: None 6 Click Score: 24   End of Session Equipment Utilized During Treatment: Other (comment) (N/A) Nurse Communication: Mobility status  Activity Tolerance: Patient tolerated treatment well Patient left: in chair;with call bell/phone within reach  OT Visit Diagnosis: Pain Pain - part of body:  (abdomen and chest)                Time: 1015-1030 OT Time Calculation (min): 15 min Charges:  OT General Charges $OT Visit: 1 Visit OT Evaluation $OT Eval Low Complexity: 1 Low    Kessie Croston L Albertina Leise, OTR/L 11/19/2023, 11:51 AM

## 2023-11-19 NOTE — Progress Notes (Signed)
 Patient left before discharge medications delivered to room. Patient called  per [employer] secretary, patient said they would come back and get them. Medications handed off to C Feliberto Peak, Press photographer.  JONETTA Ryder RN

## 2023-11-19 NOTE — Plan of Care (Signed)
 ?  Problem: Clinical Measurements: ?Goal: Ability to maintain clinical measurements within normal limits will improve ?Outcome: Progressing ?Goal: Will remain free from infection ?Outcome: Progressing ?Goal: Diagnostic test results will improve ?Outcome: Progressing ?  ?

## 2023-11-19 NOTE — Telephone Encounter (Signed)
 Patient Product/process development scientist completed.    The patient is insured through [facility]. Patient has Medicare and is not eligible for a copay card, but may be able to apply for patient assistance or Medicare RX Payment Plan (Patient Must reach out to their plan, if eligible for payment plan), if available.    Ran test claim for enoxaparin (Lovenox) 60 mg/0.6 ml and the current 30 day co-pay is $0.00.   This test claim was processed through [facility]- copay amounts may vary at other pharmacies due to pharmacy/plan contracts, or as the patient moves through the different stages of their insurance plan.     Roland Earl, CPHT Pharmacy Technician III Certified Patient Advocate [facility] Pharm[facility] Patient Advocate Team Direct Number: [phone]  Fax: [phone]

## 2023-11-19 NOTE — Progress Notes (Addendum)
 S: Feeling somewhat better today. Poor sleep and still some pain. Walking independently in hall this morning. Tolerating liquids well, no nausea. Some flatus.   O: Vitals, labs, intake/output, and orders reviewed at this time. Afebrile, no tachycardia, hypertensive. Normal RR and sats 100% RA. PO 1140, UOP 1350 + 1 occurrence. No labs this AM.   PRN meds last 24h- tramadol  x 3, oxycodone  x 4, robaxin  x 2,   Gen: A&Ox3, no distress  Chest: unlabored respirations, RRR Abd: soft, nontender, nondistended, incision(s) c/d/i with steri strips/bandaids, no cellulitis or hematoma Ext: warm, no edema Neuro: grossly normal  Lines/tubes/drains: PIV  A/P: Postop day 2 status post laparoscopic Roux-en-Y gastric bypass - Continue clear liquids and protein shakes - Continue frequent ambulation, SCDs while in bed, aggressive pulmonary toilet - PT/OT eval today to ensure no home needs. She is feeling better about going home today.    Plan discharge home today.   Mitzie Freund, MD [facility], [place]

## 2023-11-20 ENCOUNTER — Telehealth: Payer: Self-pay | Admitting: *Deleted

## 2023-11-20 ENCOUNTER — Other Ambulatory Visit (HOSPITAL_COMMUNITY): Payer: Self-pay

## 2023-11-20 NOTE — Transitions of Care (Post Inpatient/ED Visit) (Signed)
   11/20/2023  Name: April [name] MRN: [id number] DOB: 1970-07-10  Today's TOC FU Call Status: Today's TOC FU Call Status:: Unsuccessful Call (1st Attempt) Unsuccessful Call (1st Attempt) Date: 11/20/23  Attempted to reach the patient regarding the most recent Inpatient/ED visit.  RNCM spoke briefly with [name]. She reports not feeling well and is planning to contact her surgeon's office. Patient requesting a call back tomorrow.  Follow Up Plan: Additional outreach attempts will be made to reach the patient to complete the Transitions of Care (Post Inpatient/ED visit) call.   Andrea Dimes RN, BSN [place]  [facility] RN Care Manager [phone]

## 2023-11-21 ENCOUNTER — Telehealth: Payer: Self-pay | Admitting: *Deleted

## 2023-11-21 ENCOUNTER — Telehealth: Payer: Self-pay

## 2023-11-21 NOTE — Transitions of Care (Post Inpatient/ED Visit) (Signed)
   11/21/2023  Name: April [name] MRN: [id number] DOB: 1970-07-09  Today's TOC FU Call Status: Today's TOC FU Call Status:: Unsuccessful Call (2nd Attempt) Unsuccessful Call (2nd Attempt) Date: 11/21/23  Attempted to reach the patient regarding the most recent Inpatient/ED visit.  Follow Up Plan: Additional outreach attempts will be made to reach the patient to complete the Transitions of Care (Post Inpatient/ED visit) call.   Andrea Dimes RN, BSN [place]  [facility] RN Care Manager [phone]

## 2023-11-21 NOTE — Transitions of Care (Post Inpatient/ED Visit) (Signed)
   11/21/2023  Name: April [name] MRN: [id number] DOB: 1970-11-02  Today's TOC FU Call Status: Today's TOC FU Call Status:: Unsuccessful Call (3rd Attempt) Unsuccessful Call (3rd Attempt) Date: 11/21/23  Attempted to reach the patient regarding the most recent Inpatient/ED visit.  Follow Up Plan: No further outreach attempts will be made at this time. We have been unable to contact the patient.   Alan Ee, RN, BSN, CEN [employer]- Transition of Care Team.  [facility] [phone]

## 2023-11-24 ENCOUNTER — Telehealth: Payer: Self-pay | Admitting: Skilled Nursing Facility1

## 2023-11-24 NOTE — Telephone Encounter (Signed)
 Returned pts call.  Pt states she is having trouble with her broth and ensure stating it hurts. Pt states she was throwing up when she first came home but has not in the last 3 days. Pt states she has been having liquid stools 1-2 per day. Pt states she does not typically have trouble with milk options. Pt states it takes 4 hours to finish one ensure.  Pt states she has been taking the protonix  daily.  Pt states she does not know what she is supposed to be dirnking  Pt states she did not buy unflavored protein powder.   Pt reports No headache or dizziness.  Advice: Buy unflavored protein powder and add it to the chicken broth Add no sugar flavorings to your water  Reach your protein and fluid goals daily If your issues continued beyond trying these things today please reach out to your surgeon  Please pull out your pre-op handouts to read all of your fluid options I will call you later in the day to check in you

## 2023-11-25 ENCOUNTER — Telehealth (HOSPITAL_COMMUNITY): Payer: Self-pay | Admitting: *Deleted

## 2023-11-25 ENCOUNTER — Ambulatory Visit: Payer: 59

## 2023-11-25 ENCOUNTER — Other Ambulatory Visit: Payer: Self-pay | Admitting: Family Medicine

## 2023-11-25 DIAGNOSIS — E038 Other specified hypothyroidism: Secondary | ICD-10-CM

## 2023-11-25 NOTE — Telephone Encounter (Signed)
 Called patient to follow-up Patient ended up sharing that she wasn't doing to well and that she was at the surgeon's office currently to be seen.  She stated that she had only been able to consume approx. 16 floz and even water  would feel like it was going to come back up.  Since the patient was currently being evaluated will await to see what the provider's notes at [facility] entail.  No further questions at this time.

## 2023-11-26 ENCOUNTER — Emergency Department (HOSPITAL_COMMUNITY)

## 2023-11-26 ENCOUNTER — Other Ambulatory Visit: Payer: Self-pay

## 2023-11-26 ENCOUNTER — Observation Stay (HOSPITAL_COMMUNITY)
Admission: EM | Admit: 2023-11-26 | Discharge: 2023-11-27 | Discharge status: Against Medical Advice | Attending: Surgery | Admitting: Surgery

## 2023-11-26 ENCOUNTER — Encounter (HOSPITAL_COMMUNITY): Payer: Self-pay

## 2023-11-26 DIAGNOSIS — I700 Atherosclerosis of aorta: Secondary | ICD-10-CM | POA: Diagnosis not present

## 2023-11-26 DIAGNOSIS — K5790 Diverticulosis of intestine, part unspecified, without perforation or abscess without bleeding: Principal | ICD-10-CM | POA: Insufficient documentation

## 2023-11-26 DIAGNOSIS — J45909 Unspecified asthma, uncomplicated: Secondary | ICD-10-CM | POA: Diagnosis present

## 2023-11-26 DIAGNOSIS — R112 Nausea with vomiting, unspecified: Principal | ICD-10-CM | POA: Diagnosis present

## 2023-11-26 DIAGNOSIS — R109 Unspecified abdominal pain: Secondary | ICD-10-CM | POA: Diagnosis present

## 2023-11-26 DIAGNOSIS — K429 Umbilical hernia without obstruction or gangrene: Secondary | ICD-10-CM | POA: Insufficient documentation

## 2023-11-26 LAB — COMPREHENSIVE METABOLIC PANEL WITH GFR
ALT: 15 U/L (ref 0–44)
AST: 16 U/L (ref 15–41)
Albumin: 3.1 g/dL — ABNORMAL LOW (ref 3.5–5.0)
Alkaline Phosphatase: 62 U/L (ref 38–126)
Anion gap: 16 — ABNORMAL HIGH (ref 5–15)
BUN: 15 mg/dL (ref 6–20)
CO2: 18 mmol/L — ABNORMAL LOW (ref 22–32)
Calcium: 9.1 mg/dL (ref 8.9–10.3)
Chloride: 103 mmol/L (ref 98–111)
Creatinine, Ser: 0.78 mg/dL (ref 0.44–1.00)
GFR, Estimated: >60 mL/min (ref >60)
Glucose, Bld: 68 mg/dL — ABNORMAL LOW (ref 70–99)
Potassium: 3.6 mmol/L (ref 3.5–5.1)
Sodium: 137 mmol/L (ref 135–145)
Total Bilirubin: 0.8 mg/dL (ref 0.0–1.2)
Total Protein: 7.2 g/dL (ref 6.5–8.1)

## 2023-11-26 LAB — URINALYSIS, ROUTINE W REFLEX MICROSCOPIC
Bacteria, UA: NONE SEEN
Bilirubin Urine: NEGATIVE
Glucose, UA: NEGATIVE mg/dL
Hgb urine dipstick: NEGATIVE
Ketones, ur: 80 mg/dL — AB
Leukocytes,Ua: NEGATIVE
Nitrite: NEGATIVE
Protein, ur: NEGATIVE mg/dL
Specific Gravity, Urine: 1.016 (ref 1.005–1.030)
pH: 5 (ref 5.0–8.0)

## 2023-11-26 LAB — CBC
HCT: 34.8 % — ABNORMAL LOW (ref 36.0–46.0)
Hemoglobin: 10.8 g/dL — ABNORMAL LOW (ref 12.0–15.0)
MCH: 27.1 pg (ref 26.0–34.0)
MCHC: 31 g/dL (ref 30.0–36.0)
MCV: 87.2 fL (ref 80.0–100.0)
Platelets: 397 K/uL (ref 150–400)
RBC: 3.99 MIL/uL (ref 3.87–5.11)
RDW: 16.4 % — ABNORMAL HIGH (ref 11.5–15.5)
WBC: 9.3 K/uL (ref 4.0–10.5)
nRBC: 0 % (ref 0.0–0.2)

## 2023-11-26 LAB — LIPASE, BLOOD: Lipase: 32 U/L (ref 11–51)

## 2023-11-26 MED ORDER — SIMETHICONE 80 MG PO CHEW
40.0000 mg | CHEWABLE_TABLET | Freq: Four times a day (QID) | ORAL | Status: DC | PRN
Start: 2023-11-26 — End: 2023-11-27

## 2023-11-26 MED ORDER — LEVOTHYROXINE SODIUM 50 MCG PO TABS
50.0000 ug | ORAL_TABLET | Freq: Every day | ORAL | Status: DC
  Administered 2023-11-27: 50 ug via ORAL
  Filled 2023-11-26: qty 1

## 2023-11-26 MED ORDER — BISACODYL 10 MG RE SUPP: 10.0000 mg | Freq: Every day | RECTAL | Status: DC | PRN

## 2023-11-26 MED ORDER — BUDESON-GLYCOPYRROL-FORMOTEROL 160-9-4.8 MCG/ACT IN AERO
2.0000 | INHALATION_SPRAY | Freq: Two times a day (BID) | RESPIRATORY_TRACT | Status: DC
  Administered 2023-11-26 – 2023-11-27 (×2): 2 via RESPIRATORY_TRACT
  Filled 2023-11-26: qty 5.9

## 2023-11-26 MED ORDER — TRAMADOL HCL 50 MG PO TABS
50.0000 mg | ORAL_TABLET | Freq: Four times a day (QID) | ORAL | Status: DC | PRN
  Filled 2023-11-26: qty 1

## 2023-11-26 MED ORDER — ONDANSETRON HCL 4 MG/2ML IJ SOLN: 4.0000 mg | Freq: Four times a day (QID) | INTRAMUSCULAR | Status: DC | PRN

## 2023-11-26 MED ORDER — HYDROMORPHONE HCL 1 MG/ML IJ SOLN
0.5000 mg | INTRAMUSCULAR | Status: DC | PRN
  Administered 2023-11-26 – 2023-11-27 (×4): 0.5 mg via INTRAVENOUS
  Filled 2023-11-26 (×4): qty 0.5

## 2023-11-26 MED ORDER — SODIUM CHLORIDE 0.9 % IV SOLN: INTRAVENOUS | Status: DC

## 2023-11-26 MED ORDER — DIPHENHYDRAMINE HCL 25 MG PO CAPS: 25.0000 mg | ORAL_CAPSULE | Freq: Four times a day (QID) | ORAL | Status: DC | PRN

## 2023-11-26 MED ORDER — PROCHLORPERAZINE MALEATE 10 MG PO TABS: 10.0000 mg | ORAL_TABLET | Freq: Four times a day (QID) | ORAL | Status: DC | PRN

## 2023-11-26 MED ORDER — CLONAZEPAM 0.5 MG PO TABS
0.5000 mg | ORAL_TABLET | Freq: Every day | ORAL | Status: DC
  Administered 2023-11-26: 0.5 mg via ORAL
  Filled 2023-11-26: qty 1

## 2023-11-26 MED ORDER — PREGABALIN 50 MG PO CAPS: 75.0000 mg | ORAL_CAPSULE | Freq: Three times a day (TID) | ORAL | Status: DC | PRN

## 2023-11-26 MED ORDER — METOCLOPRAMIDE HCL 5 MG/ML IJ SOLN
10.0000 mg | Freq: Once | INTRAMUSCULAR | Status: AC
  Administered 2023-11-26: 10 mg via INTRAVENOUS
  Filled 2023-11-26: qty 2

## 2023-11-26 MED ORDER — METOCLOPRAMIDE HCL 5 MG/ML IJ SOLN
10.0000 mg | Freq: Four times a day (QID) | INTRAMUSCULAR | Status: DC
  Administered 2023-11-26 – 2023-11-27 (×3): 10 mg via INTRAVENOUS
  Filled 2023-11-26 (×3): qty 2

## 2023-11-26 MED ORDER — ACETAMINOPHEN 325 MG PO TABS
650.0000 mg | ORAL_TABLET | Freq: Four times a day (QID) | ORAL | Status: DC | PRN
Start: 2023-11-26 — End: 2023-11-26

## 2023-11-26 MED ORDER — ACETAMINOPHEN 650 MG RE SUPP: 650.0000 mg | Freq: Four times a day (QID) | RECTAL | Status: DC | PRN

## 2023-11-26 MED ORDER — ONDANSETRON 4 MG PO TBDP: 4.0000 mg | ORAL_TABLET | Freq: Four times a day (QID) | ORAL | Status: DC | PRN

## 2023-11-26 MED ORDER — METOPROLOL TARTRATE 5 MG/5ML IV SOLN: 5.0000 mg | Freq: Four times a day (QID) | INTRAVENOUS | Status: DC | PRN

## 2023-11-26 MED ORDER — IPRATROPIUM-ALBUTEROL 0.5-2.5 (3) MG/3ML IN SOLN: 3.0000 mL | Freq: Every day | RESPIRATORY_TRACT | Status: DC | PRN

## 2023-11-26 MED ORDER — METHOCARBAMOL 1000 MG/10ML IJ SOLN: 500.0000 mg | Freq: Three times a day (TID) | INTRAMUSCULAR | Status: DC | PRN

## 2023-11-26 MED ORDER — ALBUTEROL SULFATE (2.5 MG/3ML) 0.083% IN NEBU: 2.5000 mg | INHALATION_SOLUTION | Freq: Four times a day (QID) | RESPIRATORY_TRACT | Status: DC | PRN

## 2023-11-26 MED ORDER — ZOLPIDEM TARTRATE 5 MG PO TABS
5.0000 mg | ORAL_TABLET | Freq: Every evening | ORAL | Status: DC | PRN
  Administered 2023-11-26: 5 mg via ORAL
  Filled 2023-11-26: qty 1

## 2023-11-26 MED ORDER — LACTATED RINGERS IV BOLUS
1000.0000 mL | Freq: Once | INTRAVENOUS | Status: AC
  Administered 2023-11-26: 1000 mL via INTRAVENOUS

## 2023-11-26 MED ORDER — TOPIRAMATE 100 MG PO TABS: 200.0000 mg | ORAL_TABLET | Freq: Every day | ORAL | Status: DC

## 2023-11-26 MED ORDER — CARVEDILOL 3.125 MG PO TABS
6.2500 mg | ORAL_TABLET | Freq: Every day | ORAL | Status: DC
  Administered 2023-11-27: 6.25 mg via ORAL
  Filled 2023-11-26: qty 2

## 2023-11-26 MED ORDER — HYDROMORPHONE HCL 1 MG/ML IJ SOLN
1.0000 mg | Freq: Once | INTRAMUSCULAR | Status: AC
  Administered 2023-11-26: 1 mg via INTRAVENOUS
  Filled 2023-11-26: qty 1

## 2023-11-26 MED ORDER — METHOCARBAMOL 500 MG PO TABS
500.0000 mg | ORAL_TABLET | Freq: Three times a day (TID) | ORAL | Status: DC | PRN
  Administered 2023-11-27: 500 mg via ORAL
  Filled 2023-11-26: qty 1

## 2023-11-26 MED ORDER — PROCHLORPERAZINE EDISYLATE 10 MG/2ML IJ SOLN: 5.0000 mg | Freq: Four times a day (QID) | INTRAMUSCULAR | Status: DC | PRN

## 2023-11-26 MED ORDER — ENOXAPARIN SODIUM 80 MG/0.8ML IJ SOSY
70.0000 mg | PREFILLED_SYRINGE | INTRAMUSCULAR | Status: DC
  Administered 2023-11-26: 70 mg via SUBCUTANEOUS
  Filled 2023-11-26: qty 0.8

## 2023-11-26 MED ORDER — DOCUSATE SODIUM 100 MG PO CAPS
100.0000 mg | ORAL_CAPSULE | Freq: Two times a day (BID) | ORAL | Status: DC
  Administered 2023-11-26 – 2023-11-27 (×2): 100 mg via ORAL
  Filled 2023-11-26 (×2): qty 1

## 2023-11-26 MED ORDER — DIPHENHYDRAMINE HCL 50 MG/ML IJ SOLN: 25.0000 mg | Freq: Four times a day (QID) | INTRAMUSCULAR | Status: DC | PRN

## 2023-11-26 MED ORDER — HYDRALAZINE HCL 20 MG/ML IJ SOLN: 10.0000 mg | INTRAMUSCULAR | Status: DC | PRN

## 2023-11-26 MED ORDER — ACETAMINOPHEN 500 MG PO TABS
1000.0000 mg | ORAL_TABLET | Freq: Four times a day (QID) | ORAL | Status: DC
  Administered 2023-11-26 – 2023-11-27 (×3): 1000 mg via ORAL
  Filled 2023-11-26 (×2): qty 2

## 2023-11-26 MED ORDER — PANTOPRAZOLE SODIUM 40 MG IV SOLR
40.0000 mg | Freq: Two times a day (BID) | INTRAVENOUS | Status: DC
  Administered 2023-11-26 – 2023-11-27 (×2): 40 mg via INTRAVENOUS
  Filled 2023-11-26 (×2): qty 10

## 2023-11-26 NOTE — ED Triage Notes (Signed)
 BIBA from home for abdominal pain after gastric bypass surgery 9 days ago, having episodes of nausea and vomiting. 128/82 BP 76 HR 97% R/A 70 cbg

## 2023-11-26 NOTE — ED Provider Notes (Signed)
 [place] EMERGENCY DEPARTMENT AT [facility] Provider Note   CSN: [id number] Arrival date & time: 11/26/23  1209     Patient presents with: Abdominal Pain   April [name] is a 53 y.o. female.   53 year old female with recent gastric bypass surgery 9 days ago presents with abdominal pain and nonbilious emesis.  States symptoms began yesterday.  Was seen in the general surgery office and was told to come here for further evaluation.  Patient decided to do that and came back today instead.  No fever or chills.  She is passing flatus.  Pain is worse after she eats.       Prior to Admission medications   Medication Sig Start Date End Date Taking? Authorizing Provider  albuterol  (VENTOLIN  HFA) 108 (90 Base) MCG/ACT inhaler INHALE 2 PUFFS INTO THE LUNGS EVERY 6 HOURS AS NEEDED FOR WHEEZING OR SHORTNESS OF BREATH Patient taking differently: Inhale 1-2 puffs into the lungs every 6 (six) hours as needed for wheezing or shortness of breath. 09/01/23   Neysa Reggy BIRCH, MD  amLODipine  (NORVASC ) 10 MG tablet Take 1 tablet (10 mg total) by mouth daily. Patient not taking: Reported on 11/13/2023 02/05/23   Newlin, Enobong, MD  apixaban  (ELIQUIS ) 5 MG TABS tablet Take 1 tablet (5 mg total) by mouth 2 (two) times daily. 11/19/23   Signe Mitzie LABOR, MD  atorvastatin  (LIPITOR) 20 MG tablet Take 1 tablet (20 mg total) by mouth daily. 02/05/23   Newlin, Enobong, MD  carvedilol  (COREG ) 6.25 MG tablet Take 1 tablet (6.25 mg total) by mouth 2 (two) times daily with a meal. Patient taking differently: Take 6.25 mg by mouth daily. 02/05/23   Newlin, Enobong, MD  clonazePAM  (KLONOPIN ) 0.5 MG tablet 1 or 2 tabs at bedtime as needed for sleep Patient taking differently: Take 0.5 mg by mouth at bedtime. 09/08/23   Neysa Reggy D, MD  cloNIDine  (CATAPRES ) 0.2 MG tablet TAKE 1 TABLET BY MOUTH TWICE DAILY Patient taking differently: Take 0.2 mg by mouth daily as needed (hot flashes). 08/25/23   Newlin,  Enobong, MD  docusate sodium  (COLACE) 100 MG capsule Take 1 capsule (100 mg total) by mouth 2 (two) times daily. Okay to decrease to once daily or stop taking if having loose bowel movements 11/19/23 12/19/23  Signe Mitzie LABOR, MD  Fezolinetant  (VEOZAH ) 45 MG TABS Take 1 tablet (45 mg total) by mouth daily. 12/02/22   Newlin, Enobong, MD  fluticasone  (FLONASE ) 50 MCG/ACT nasal spray 1-2 puffs each nostril at bedtime while needed Patient taking differently: Place 1 spray into both nostrils daily as needed for allergies. 09/08/23   Neysa Reggy BIRCH, MD  Fluticasone -Umeclidin-Vilant (TRELEGY ELLIPTA ) 200-62.5-25 MCG/ACT AEPB Inhale 1 puff into the lungs daily. 06/09/23   Neysa Reggy BIRCH, MD  gabapentin  (NEURONTIN ) 100 MG capsule Take 1 capsule (100 mg total) by mouth every 12 (twelve) hours for 5 days. 11/19/23 11/24/23  Signe Mitzie LABOR, MD  ipratropium-albuterol  (DUONEB) 0.5-2.5 (3) MG/3ML SOLN Take 3 mLs by nebulization every 4 (four) hours as needed (shortness of breath or wheezing). Patient taking differently: Take 3 mLs by nebulization daily as needed (shortness of breath or wheezing). 01/26/23 11/14/23  Laurence Locus, DO  levothyroxine  (SYNTHROID ) 50 MCG tablet TAKE 1 TABLET(50 MCG) BY MOUTH DAILY BEFORE BREAKFAST 11/26/23   Newlin, Enobong, MD  montelukast  (SINGULAIR ) 10 MG tablet Take 1 tablet (10 mg total) by mouth at bedtime. 06/09/23   Neysa Reggy BIRCH, MD  naloxone  (NARCAN ) nasal spray  4 mg/0.1 mL Place 1 spray into the nose once as needed (opioid overdose). 08/09/20   [provider]  ondansetron  (ZOFRAN -ODT) 4 MG disintegrating tablet Take 1 tablet (4 mg total) by mouth every 6 (six) hours as needed for nausea or vomiting. 11/19/23   Signe Mitzie LABOR, MD  pantoprazole  (PROTONIX ) 40 MG tablet Take 1 tablet (40 mg total) by mouth daily. Take this medication daily, regardless of reflux symptoms 11/19/23   Signe Mitzie LABOR, MD  pregabalin  (LYRICA ) 75 MG capsule Take 75 mg by mouth 2 (two) times  daily.    [provider]  solifenacin  (VESICARE ) 5 MG tablet TAKE 1 TABLET(5 MG) BY MOUTH DAILY Patient taking differently: Take 5 mg by mouth daily as needed (spasms). 07/16/23   Newlin, Enobong, MD  Tezepelumab -ekko (TEZSPIRE ) 210 MG/1. SOAJ Inject 210 mg into the skin every 28 (twenty-eight) days. 10/14/23   Neysa Rama D, MD  topiramate  (TOPAMAX ) 200 MG tablet Take 200 mg by mouth at bedtime. 01/08/21   [provider]  traMADol  (ULTRAM ) 50 MG tablet Take 1 tablet (50 mg total) by mouth every 6 (six) hours as needed for moderate pain (pain score 4-6) or severe pain (pain score 7-10). 11/19/23   Signe Mitzie LABOR, MD  Vitamin D , Ergocalciferol , (DRISDOL ) 1.25 MG (50000 UNIT) CAPS capsule Take 1 capsule (50,000 Units total) by mouth every 7 (seven) days. Patient taking differently: Take 50,000 Units by mouth every 7 (seven) days. Sunday 07/07/23   Newlin, Enobong, MD  zolpidem  (AMBIEN ) 10 MG tablet Take 10 mg by mouth at bedtime as needed for sleep. 10/30/23   [provider]  mometasone -formoterol  (DULERA ) 200-5 MCG/ACT AERO Inhale 2 puffs into the lungs 2 (two) times daily. Dx: Asthma 04/08/19 08/12/19  Neysa Rama BIRCH, MD    Allergies: Other and Contrast media [iodinated contrast media]    Review of Systems  All other systems reviewed and are negative.   Updated Vital Signs BP 137/69 (BP Location: Left Arm)   Pulse 76   Temp 98.5 F (36.9 C) (Oral)   Resp 18   LMP 11/10/2023 (Exact Date)   SpO2 97%   Physical Exam Vitals and nursing note reviewed.  Constitutional:      General: She is not in acute distress.    Appearance: Normal appearance. She is well-developed. She is not toxic-appearing.  HENT:     Head: Normocephalic and atraumatic.  Eyes:     General: Lids are normal.     Conjunctiva/sclera: Conjunctivae normal.     Pupils: Pupils are equal, round, and reactive to light.  Neck:     Thyroid: No thyroid mass.     Trachea: No tracheal  deviation.  Cardiovascular:     Rate and Rhythm: Normal rate and regular rhythm.     Heart sounds: Normal heart sounds. No murmur heard.    No gallop.  Pulmonary:     Effort: Pulmonary effort is normal. No respiratory distress.     Breath sounds: Normal breath sounds. No stridor. No decreased breath sounds, wheezing, rhonchi or rales.  Abdominal:     General: There is no distension.     Palpations: Abdomen is soft.     Tenderness: There is abdominal tenderness in the epigastric area. There is no guarding or rebound.  Musculoskeletal:        General: No tenderness. Normal range of motion.     Cervical back: Normal range of motion and neck supple.  Skin:    General:  Skin is warm and dry.     Findings: No abrasion or rash.  Neurological:     Mental Status: She is alert and oriented to person, place, and time. Mental status is at baseline.     GCS: GCS eye subscore is 4. GCS verbal subscore is 5. GCS motor subscore is 6.     Cranial Nerves: No cranial nerve deficit.     Sensory: No sensory deficit.     Motor: Motor function is intact.  Psychiatric:        Attention and Perception: Attention normal.        Speech: Speech normal.        Behavior: Behavior normal.     (all labs ordered are listed, but only abnormal results are displayed) Labs Reviewed  LIPASE, BLOOD  COMPREHENSIVE METABOLIC PANEL WITH GFR  CBC  URINALYSIS, ROUTINE W REFLEX MICROSCOPIC    EKG: None  Radiology: No results found.   Procedures   Medications Ordered in the ED  metoCLOPramide  (REGLAN ) injection 10 mg (10 mg Intravenous Given 11/26/23 1244)                                    Medical Decision Making Amount and/or Complexity of Data Reviewed Labs: ordered. Radiology: ordered.  Risk Prescription drug management.   Patient medicated with IV fluids, antiemetics, pain medication.  Spoke with general surgery.  Abdominal CT shows no significant finding.  Care turned over to Dr. Dreama      Final diagnoses:  None    ED Discharge Orders     None          Dasie Faden, MD 11/26/23 1517

## 2023-11-26 NOTE — ED Provider Notes (Signed)
  Physical Exam  BP 137/69 (BP Location: Left Arm)   Pulse 76   Temp 98.5 F (36.9 C) (Oral)   Resp 18   LMP 11/10/2023 (Exact Date)   SpO2 97%   Physical Exam  Procedures  Procedures  ED Course / MDM    Medical Decision Making Amount and/or Complexity of Data Reviewed Labs: ordered. Radiology: ordered.  Risk Prescription drug management.   *** Gastric bypass 9 days ago, now has had a few days of nausea and vomiting.  CT completed. General surgery coming to evaluate.

## 2023-11-26 NOTE — ED Notes (Signed)
 Patient transported to CT

## 2023-11-26 NOTE — H&P (Signed)
 Pt is POD 9 s/p uneventful laparoscopic roux en y gastric bypass. Was seen in the office this week noting poor PO intake and epigastric pain with eating. Was advised to present to ER yesterday after this visit and came in today.  Reports she has been having bowel movements and flatus, last BM yesterday.  Vitals within normal limits.  Labs with bicarb 18, but otherwise unremarkable.  CT reviewed, roux limb with what appears to be fecalized contents but no apparent anastomotic leak or overt obstruction. Patient confirms she has only been consuming clear liquids and protein shakes, denies any solid food intake.  Admit for IV fluids and bowel rest tonight. Gastrograffin study tomorrow.

## 2023-11-27 LAB — COMPREHENSIVE METABOLIC PANEL WITH GFR
ALT: 15 U/L (ref 0–44)
AST: 14 U/L — ABNORMAL LOW (ref 15–41)
Albumin: 2.7 g/dL — ABNORMAL LOW (ref 3.5–5.0)
Alkaline Phosphatase: 54 U/L (ref 38–126)
Anion gap: 15 (ref 5–15)
BUN: 11 mg/dL (ref 6–20)
CO2: 17 mmol/L — ABNORMAL LOW (ref 22–32)
Calcium: 8.3 mg/dL — ABNORMAL LOW (ref 8.9–10.3)
Chloride: 106 mmol/L (ref 98–111)
Creatinine, Ser: 0.61 mg/dL (ref 0.44–1.00)
GFR, Estimated: >60 mL/min (ref >60)
Glucose, Bld: 68 mg/dL — ABNORMAL LOW (ref 70–99)
Potassium: 3.4 mmol/L — ABNORMAL LOW (ref 3.5–5.1)
Sodium: 138 mmol/L (ref 135–145)
Total Bilirubin: 0.7 mg/dL (ref 0.0–1.2)
Total Protein: 6.6 g/dL (ref 6.5–8.1)

## 2023-11-27 LAB — CBC
HCT: 34.2 % — ABNORMAL LOW (ref 36.0–46.0)
Hemoglobin: 10.5 g/dL — ABNORMAL LOW (ref 12.0–15.0)
MCH: 26.9 pg (ref 26.0–34.0)
MCHC: 30.7 g/dL (ref 30.0–36.0)
MCV: 87.5 fL (ref 80.0–100.0)
Platelets: 396 K/uL (ref 150–400)
RBC: 3.91 MIL/uL (ref 3.87–5.11)
RDW: 16.4 % — ABNORMAL HIGH (ref 11.5–15.5)
WBC: 9.1 K/uL (ref 4.0–10.5)
nRBC: 0 % (ref 0.0–0.2)

## 2023-11-27 LAB — HIV ANTIBODY (ROUTINE TESTING W REFLEX): HIV Screen 4th Generation wRfx: NONREACTIVE

## 2023-11-27 LAB — MAGNESIUM: Magnesium: 1.7 mg/dL (ref 1.7–2.4)

## 2023-11-27 MED ORDER — POTASSIUM CHLORIDE 10 MEQ/100ML IV SOLN
10.0000 meq | INTRAVENOUS | Status: AC
  Administered 2023-11-27 (×3): 10 meq via INTRAVENOUS
  Filled 2023-11-27 (×4): qty 100

## 2023-11-27 MED ORDER — APIXABAN 5 MG PO TABS
5.0000 mg | ORAL_TABLET | Freq: Two times a day (BID) | ORAL | Status: DC
  Administered 2023-11-27: 5 mg via ORAL
  Filled 2023-11-27: qty 1

## 2023-11-27 MED ORDER — MAGNESIUM SULFATE 4 GM/100ML IV SOLN
4.0000 g | Freq: Once | INTRAVENOUS | Status: AC
  Administered 2023-11-27: 4 g via INTRAVENOUS
  Filled 2023-11-27: qty 100

## 2023-11-27 MED ORDER — ENSURE MAX PROTEIN PO LIQD
2.0000 [oz_av] | ORAL | Status: DC
  Administered 2023-11-27 (×2): 2 [oz_av] via ORAL

## 2023-11-27 NOTE — Progress Notes (Addendum)
 The patient is eager to discharge to home.Discussed potassium doses will not be completed until this evening. Potassium Rate adjusted to 75 ml/hr and ultimately 50 ml/hr per patient request Additionally the patient has only had 4 oz, po intake. She denies abdominal pain, nausea or vomiting.  Provider contacted regarding patients wishes to discharge at this time.  Patient request to leave AMA. No change from am assessment.

## 2023-11-27 NOTE — Plan of Care (Signed)

## 2023-11-27 NOTE — Progress Notes (Addendum)
 S: She stated that she feels her 100% better today.  Continues to pass flatus and tolerated ice chips overnight without any issue.  Feels that she was not mentally prepared before, and states that when she discharged last week she knew she would be coming back.  She now states that she feels much better about being at home.  O: Vitals, labs, intake/output, and orders reviewed at this time. Afebrile, no tachycardia, BP WNL. PO 180, UOP 400 + 4 occurrences. CMP- bicarb remains low and K/Mg low, WBC normal, Hgb stable   Gen: A&Ox3, no distress  H&N: EOMI, atraumatic, neck supple Chest: unlabored respirations, RRR Abd: soft, nontender, nondistended, incision(s) c/d/i  Ext: warm, no edema Neuro: grossly normal  Lines/tubes/drains: PIV  A/P: POD 10 s/p roux en y gastric bypass, poor p.o. intake and pain, CT reviewed as per yesterday with what appears to be fecalized contents along the Roux limb but no overt obstruction - Patient reports she feels under percent better today.  Really wants to go home.  Will trial clear liquids/protein shakes. -Bicarb 17 -Hypokalemia 3.4, Mag 1.7- IV replacement ordered  Mitzie Freund, MD [facility], [place]

## 2023-11-27 NOTE — Care Management Obs Status (Signed)
 MEDICARE OBSERVATION STATUS NOTIFICATION   Patient Details  Name: April [name] MRN: [id number] Date of Birth: 1971/03/27   Medicare Observation Status Notification Given:  Yes    Alfonse JONELLE Rex, RN 11/27/2023, 10:23 AM

## 2023-11-27 NOTE — TOC Initial Note (Addendum)
 Transition of Care [facility]) - Initial/Assessment Note    Patient Details  Name: April [name] MRN: [id number] Date of Birth: 1970-12-29  Transition of Care [facility]) CM/SW Contact:    Alfonse JONELLE Rex, RN Phone Number: 11/27/2023, 10:28 AM  Clinical Narrative:  Met with patient at bedside to introduce role of TOC/NCM and review for dc planning, pt confirmed she has an established PCP, reports she lives alone with support from her family, reports no current home care services, states she has a cane she uses as needed for functional mobility. Family to provide transportation at discharge. MOON reviewed with patient, patient declined to signed, MOON completed by NCM, copy provided to patient.  TOC will continue to follow.                  Expected Discharge Plan: Home/Self Care Barriers to Discharge: Continued Medical Work up   Patient Goals and CMS Choice Patient states their goals for this hospitalization and ongoing recovery are:: return home          Expected Discharge Plan and Services       Living arrangements for the past 2 months: Apartment                                      Prior Living Arrangements/Services Living arrangements for the past 2 months: Apartment Lives with:: Self Patient language and need for interpreter reviewed:: Yes Do you feel safe going back to the place where you live?: Yes      Need for Family Participation in Patient Care: Yes (Comment) Care giver support system in place?: Yes (comment) Current home services: DME (cane, wheelchair) Criminal Activity/Legal Involvement Pertinent to Current Situation/Hospitalization: No - Comment as needed  Activities of Daily Living   ADL Screening (condition at time of admission) Independently performs ADLs?: Yes (appropriate for developmental age) Is the patient deaf or have difficulty hearing?: No Does the patient have difficulty seeing, even when wearing glasses/contacts?: Yes Does the patient have  difficulty concentrating, remembering, or making decisions?: No  Permission Sought/Granted                  Emotional Assessment Appearance:: Appears stated age Attitude/Demeanor/Rapport: Gracious Affect (typically observed): Accepting Orientation: : Oriented to Self, Oriented to Place, Oriented to  Time, Oriented to Situation Alcohol / Substance Use: Not Applicable Psych Involvement: No (comment)  Admission diagnosis:  Nausea & vomiting [R11.2] Patient Active Problem List   Diagnosis Date Noted   Nausea & vomiting 11/26/2023   Morbid obesity (HCC) 11/17/2023   Asthma exacerbation 05/29/2023   Acute on chronic diastolic (congestive) heart failure (HCC) 05/20/2023   Moderate episode of recurrent major depressive disorder (HCC) 05/20/2023   COPD exacerbation (HCC) 12/05/2022   Acute asthma exacerbation 07/19/2022   Primary osteoarthritis of left knee 06/11/2022   Asthma with COPD with exacerbation (HCC) 04/28/2022   Chronic pain syndrome    Hypothyroidism 08/23/2021   Chronic heart failure with preserved ejection fraction (HFpEF) (HCC) 08/23/2021   Primary osteoarthritis of right knee 07/04/2021   Body mass index 50.0-59.9, adult (HCC) 07/04/2021   Painful orthopaedic hardware (HCC) 07/04/2021   Severe persistent asthma with exacerbation 07/04/2020   Hot flashes    Asthma-COPD overlap syndrome (HCC) 10/26/2018   Vitamin D  deficiency 05/26/2018   Urinary incontinence 11/17/2017   Physical deconditioning 10/25/2017   Debility 09/22/2017   Acute on chronic respiratory  failure with hypoxemia (HCC) 08/05/2017   Chronic atrial fibrillation 07/29/2017   AF (paroxysmal atrial fibrillation) (HCC)    Dyspnea 02/04/2017   Moderate persistent asthma with exacerbation 10/19/2016   Hypochromic anemia 10/02/2016   Elevated hemoglobin A1c 05/05/2016   GERD (gastroesophageal reflux disease) 08/30/2015   Anxiety and depression 08/30/2015   Generalized anxiety disorder 08/30/2015    Hyperlipidemia 12/08/2013   Seasonal allergic rhinitis 08/29/2013   Tobacco abuse in remission 10/07/2012   Chronic obstructive pulmonary disease with acute exacerbation (HCC) 05/07/2012   Knee pain, bilateral 04/25/2011   Insomnia 03/14/2011   Obstructive sleep apnea 12/19/2010   Hypokalemia 08/13/2010   Cervical back pain with evidence of disc disease 04/08/2008   Essential hypertension 07/31/2006   Morbid obesity due to excess calories (HCC) 06/17/2006   Major depressive disorder, recurrent episode (HCC) 04/10/2006   PCP:  Delbert Clam, MD Pharmacy:   [facility] [phone] - [place], [place] - [address] AT [facility] OF [address] [place] [address] Phone: [phone] Fax: [phone]     Social Drivers of Health (SDOH) Social History: SDOH Screenings   Food Insecurity: No Food Insecurity (11/26/2023)  Housing: Low Risk  (11/26/2023)  Transportation Needs: No Transportation Needs (11/26/2023)  Utilities: Not At Risk (11/26/2023)  Alcohol Screen: Low Risk  (11/20/2022)  Depression (PHQ2-9): Medium Risk (08/01/2023)  Financial Resource Strain: Medium Risk (11/20/2022)  Physical Activity: Insufficiently Active (11/20/2022)  Social Connections: Socially Isolated (11/20/2022)  Stress: Stress Concern Present (11/20/2022)  Tobacco Use: Medium Risk (11/26/2023)  Health Literacy: Adequate Health Literacy (11/20/2022)   SDOH Interventions:     Readmission Risk Interventions    11/18/2023   12:32 PM 01/24/2023   11:12 AM 12/07/2022   10:59 AM  Readmission Risk Prevention Plan  Transportation Screening Complete Complete Complete  PCP or Specialist Appt within 5-7 Days Complete    Home Care Screening Complete    Medication Review (RN CM) Complete    Medication Review (RN Care Manager)   Complete  PCP or Specialist appointment within 3-5 days of discharge  Complete Complete  HRI or Home Care Consult  Complete Complete  SW Recovery  Care/Counseling Consult  Complete Complete  Palliative Care Screening  Not Applicable Not Applicable  Skilled Nursing Facility  Not Applicable Not Applicable

## 2023-12-01 ENCOUNTER — Telehealth: Payer: Self-pay

## 2023-12-01 NOTE — Transitions of Care (Post Inpatient/ED Visit) (Signed)
   12/01/2023  Name: April [name] MRN: [id number] DOB: May 31, 1970  Today's TOC FU Call Status: Today's TOC FU Call Status:: Successful TOC FU Call Completed TOC FU Call Complete Date: 12/01/23 Patient's Name and Date of Birth confirmed.  Transition Care Management Follow-up Telephone Call Date of Discharge: 11/27/23 Discharge Facility: [facility]) Type of Discharge: Inpatient Admission Primary Inpatient Discharge Diagnosis:: abdominal pain- left AMA How have you been since you were released from the hospital?: Better (She said all is good. She is feeling much better after this hospitalization and is getting around easier than she thought she would.) Any questions or concerns?: No  Items Reviewed: Did you receive and understand the discharge instructions provided?: No (She left AMA, no AVS was provided) Medications obtained,verified, and reconciled?: Partial Review Completed Reason for Partial Mediation Review: She did not receive any prescriptions at discharge as she left AMA.  She said she has all of her medications and did not have any questions about the meds and did not need to review the med list. Any new allergies since your discharge?: No Dietary orders reviewed?: Yes Type of Diet Ordered:: she is tolerating a liquid diet including protein drinks. She said she tried to eat a boiled egg but it hurt and she probably should not have tried it. She stated she is able to tolerate 64 oz /day of water . Do you have support at home?: Yes People in Home [RPT]: alone Name of Support/Comfort Primary Source: her daughter has been staying with her.  Medications Reviewed Today: Medications Reviewed Today   Medications were not reviewed in this encounter     Home Care and Equipment/Supplies: Were Home Health Services Ordered?: No Any new equipment or medical supplies ordered?: No  Functional Questionnaire: Do you need assistance with bathing/showering or dressing?: No Do you need  assistance with meal preparation?: No Do you need assistance with eating?: No Do you have difficulty maintaining continence: No Do you need assistance with getting out of bed/getting out of a chair/moving?: No Do you have difficulty managing or taking your medications?: No  Follow up appointments reviewed: PCP Follow-up appointment confirmed?: Yes Date of PCP follow-up appointment?: 12/15/23 Follow-up Provider: [facility] Follow-up appointment confirmed?: Yes Date of Specialist follow-up appointment?: 12/10/23 Follow-Up Specialty Provider:: surgeon.  She has an appointment with the nutritionist tomorrow, 12/02/2023. Do you need transportation to your follow-up appointment?: No (If she does not have a ride to her appointment at [facility], I instructed her to call for a cab ride.) Do you understand care options if your condition(s) worsen?: Yes-patient verbalized understanding    SIGNATURE Slater Diesel, RN

## 2023-12-02 ENCOUNTER — Encounter: Attending: Surgery | Admitting: Dietician

## 2023-12-02 ENCOUNTER — Encounter: Payer: Self-pay | Admitting: Dietician

## 2023-12-02 ENCOUNTER — Other Ambulatory Visit: Payer: Self-pay

## 2023-12-02 VITALS — Ht 65.0 in | Wt 301.7 lb

## 2023-12-02 DIAGNOSIS — Z713 Dietary counseling and surveillance: Secondary | ICD-10-CM | POA: Insufficient documentation

## 2023-12-02 DIAGNOSIS — Z6841 Body Mass Index (BMI) 40.0 and over, adult: Secondary | ICD-10-CM | POA: Insufficient documentation

## 2023-12-02 DIAGNOSIS — E669 Obesity, unspecified: Secondary | ICD-10-CM | POA: Insufficient documentation

## 2023-12-02 NOTE — Progress Notes (Signed)
 2 Week Post-Operative Nutrition Class   Patient was seen on 12/02/2023 for Post-Operative Nutrition education at the [facility].    Surgery date: 11/17/2023 Surgery type: RYGB  Anthropometrics  Start weight at [facility]: 323.9 lbs (date: 05/01/2023)  Height: 65 in Weight today: 301.7 lbs   Clinical   Pharmacotherapy: History of weight loss medication used: phentermine , Ozempic  (not tolerated)  Medical hx: asthma, COPD, HTN Medications:  omeprazole , gabapentin , albuterol , clonazepam , epinephrine , famotidine , Flonase , Rybelsus , Ambien , vit D, calcium   Labs: Chol 214; Triglycerides 171; LDL 127; A1c 5.8, folate 2.4, Vit D 25.5 Notable signs/symptoms: walking slow Any previous deficiencies? No Bowel Habits: Every day to every other day no complaints   Body Composition Scale 12/02/2023  Current Body Weight 301.7  Total Body Fat % 49.3  Visceral Fat 21  Fat-Free Mass % 50.6   Total Body Water  % 39.8  Muscle-Mass lbs 32.4  BMI 49.9  Body Fat Displacement          Torso  lbs 92.4         Left Leg  lbs 18.4         Right Leg  lbs 18.4         Left Arm  lbs 9.2         Right Arm  lbs 9.2    The following the learning objectives were met by the patient during this course: Identifies Soft Prepped Plan Advancement Guide  Identifies Soft, High Proteins (Phase 1), beginning 2 weeks post-operatively to 3 weeks post-operatively Identifies Additional Soft High Proteins, soft non-starchy vegetables, fruits and starches (Phase 2), beginning 3 weeks post-operatively to 3 months post-operatively Identifies appropriate sources of fluids, proteins, vegetables, fruits and starches Identifies appropriate fat sources and healthy verses unhealthy fat types   States protein, vegetable, fruit and starch recommendations and appropriate sources post-operatively Identifies the need for appropriate texture modifications, mastication, and bite sizes when consuming  solids Identifies appropriate fat consumption and sources Identifies appropriate multivitamin and calcium  sources post-operatively Describes the need for physical activity post-operatively and will follow MD recommendations States when to call healthcare provider regarding medication questions or post-operative complications   Handouts given during class include: Soft Prepped Plan Advancement Guide   Follow-Up Plan: Patient will follow-up at [facility] in 10 weeks for 3 month post-op nutrition visit for diet advancement per MD.

## 2023-12-04 ENCOUNTER — Ambulatory Visit (INDEPENDENT_AMBULATORY_CARE_PROVIDER_SITE_OTHER): Admitting: *Deleted

## 2023-12-04 VITALS — BP 147/90 | HR 75 | Temp 97.5°F | Resp 20 | Ht 65.0 in | Wt 299.8 lb

## 2023-12-04 DIAGNOSIS — J4552 Severe persistent asthma with status asthmaticus: Secondary | ICD-10-CM | POA: Diagnosis not present

## 2023-12-04 DIAGNOSIS — J4489 Other specified chronic obstructive pulmonary disease: Secondary | ICD-10-CM | POA: Diagnosis not present

## 2023-12-04 MED ORDER — TEZEPELUMAB-EKKO 210 MG/1.91ML ~~LOC~~ SOSY
210.0000 mg | PREFILLED_SYRINGE | Freq: Once | SUBCUTANEOUS | Status: AC
Start: 2023-12-04 — End: 2023-12-04
  Administered 2023-12-04: 210 mg via SUBCUTANEOUS
  Filled 2023-12-04: qty 1.91

## 2023-12-04 NOTE — Progress Notes (Signed)
 Diagnosis: Asthma  Provider:  Chilton Greathouse MD  Procedure: Injection  Tezspire (Tezepelumab), Dose: 210 mg, Site: subcutaneous, Number of injections: 1  Injection Site(s): Right arm  Post Care: Observation period completed  Discharge: Condition: Good, Destination: Home . AVS Declined  Performed by:  Forrest Moron, RN

## 2023-12-11 ENCOUNTER — Telehealth: Payer: Self-pay | Admitting: Dietician

## 2023-12-11 NOTE — Telephone Encounter (Signed)
 RD called pt to verify fluid intake once starting soft, solid proteins 2 week post-bariatric surgery.   Daily Fluid intake:  Daily Protein intake:  Bowel Habits:   Concerns/issues:   Left Voice Message with call back number

## 2023-12-15 ENCOUNTER — Ambulatory Visit: Attending: Family Medicine | Admitting: Family Medicine

## 2023-12-15 ENCOUNTER — Encounter: Payer: Self-pay | Admitting: Family Medicine

## 2023-12-15 VITALS — BP 97/65 | HR 77 | Ht 65.0 in | Wt 291.4 lb

## 2023-12-15 DIAGNOSIS — I10 Essential (primary) hypertension: Secondary | ICD-10-CM

## 2023-12-15 DIAGNOSIS — E1169 Type 2 diabetes mellitus with other specified complication: Secondary | ICD-10-CM

## 2023-12-15 DIAGNOSIS — E1159 Type 2 diabetes mellitus with other circulatory complications: Secondary | ICD-10-CM

## 2023-12-15 DIAGNOSIS — G894 Chronic pain syndrome: Secondary | ICD-10-CM

## 2023-12-15 DIAGNOSIS — I480 Paroxysmal atrial fibrillation: Secondary | ICD-10-CM | POA: Diagnosis not present

## 2023-12-15 DIAGNOSIS — J4541 Moderate persistent asthma with (acute) exacerbation: Secondary | ICD-10-CM

## 2023-12-15 DIAGNOSIS — Z9884 Bariatric surgery status: Secondary | ICD-10-CM | POA: Diagnosis not present

## 2023-12-15 DIAGNOSIS — E559 Vitamin D deficiency, unspecified: Secondary | ICD-10-CM

## 2023-12-15 DIAGNOSIS — E038 Other specified hypothyroidism: Secondary | ICD-10-CM

## 2023-12-15 DIAGNOSIS — I152 Hypertension secondary to endocrine disorders: Secondary | ICD-10-CM

## 2023-12-15 LAB — POCT GLYCOSYLATED HEMOGLOBIN (HGB A1C): HbA1c, POC (controlled diabetic range): 5.9 % (ref 0.0–7.0)

## 2023-12-15 NOTE — Patient Instructions (Signed)
 VISIT SUMMARY:  You had a follow-up appointment to discuss your recent gastric bypass surgery and elevated blood pressure. You are feeling better after the surgery and have no current issues with food intake. Your blood pressure is stable, and your other health conditions, including diabetes, hypothyroidism, atrial fibrillation, asthma, and urinary incontinence, are being managed with your current medications. You are also addressing chronic pain and vitamin D  deficiency.  YOUR PLAN:  -STATUS POST GASTRIC BYPASS SURGERY: You had gastric bypass surgery last month and initially experienced some abdominal pain and vomiting, which required IV fluids. These symptoms have improved. You should follow up with your surgeon on January 13, 2024.  -HYPERTENSION: Your blood pressure is currently stable at 97/65. Weight loss from the surgery may affect your blood pressure control. Continue taking your current blood pressure medications, and we will reassess your blood pressure management at your next visit.  -TYPE 2 DIABETES MELLITUS: Your A1c level, which measures blood sugar control, is well-controlled at 5.4. You are not currently on diabetes medication, likely due to previous steroid-induced high blood sugar. Continue monitoring your blood glucose levels, and you should see an eye doctor for a diabetic eye exam.  -HYPOTHYROIDISM: Your hypothyroidism is well-managed with your current dose of levothyroxine . Your last thyroid function test in January 2025 was normal. Continue taking your levothyroxine  as prescribed, and we will request a thyroid function test during your next lab work with Designer, industrial/product.  -ATRIAL FIBRILLATION: Your atrial fibrillation, which is an irregular heartbeat, is well-managed with your current medication. Continue taking Apixaban  as prescribed.  -ASTHMA: Your asthma is being managed with your current medications, including inhalers and Singulair . Continue taking these medications as  prescribed.  -URINARY INCONTINENCE: You have intermittent urinary incontinence, and your medication was recently refilled. Monitor your symptoms and refill your medication as needed.  -CHRONIC PAIN SYNDROME: Your chronic pain is managed with oxycodone . You prefer gabapentin  over pregabalin , but you cannot take both. Discuss the effectiveness of your current medications with your pain clinic at your next visit, and consider discontinuing pregabalin  if it is not effective.  -VITAMIN D  DEFICIENCY, STATUS POST SUPPLEMENTATION: You are taking high-dose vitamin D  supplements weekly. We need to reassess your vitamin D  levels based on lab results. Continue your current vitamin D  supplementation until we reassess your levels.  INSTRUCTIONS:  Follow up with your surgeon on January 13, 2024, for your gastric bypass surgery. Continue monitoring your blood pressure and blood glucose levels. Schedule an appointment with an eye doctor for a diabetic eye exam. Request a thyroid function test and vitamin D  level check during your next lab work with your surgeon. Discuss the effectiveness of your pain medications with your pain clinic at your next visit.

## 2023-12-15 NOTE — Progress Notes (Signed)
 Subjective:  Patient ID: April [name], female    DOB: December 06, 1970  Age: 53 y.o. MRN: [id number]  CC: Medical Management of Chronic Issues (Discuss if she still needs VIT D pills)     Discussed the use of AI scribe software for clinical note transcription with the patient, who gave verbal consent to proceed.  History of Present Illness April [name] is a 53 year old female with a history of morbid obesity status post Roux-en-Y gastric bypass on 10/2023, major depressive disorder, hypertension, COPD/ Asthma, paroxysmal atrial fibrillation obstructive sleep apnea (not compliant with CPAP) with multiple hospitalizations for asthma exacerbation/COPD , steroid-induced diabetes who presents for follow-up regarding her recent gastric bypass surgery and elevated blood pressure.  She underwent gastric bypass surgery last month and initially experienced abdominal pain and vomiting, requiring IV fluids. She currently has no issues with food intake doing mainly protein shakes and feels much better. She is scheduled for a follow-up with her surgeon next month.  She has hypertension and is taking clonidine . Her blood pressure management remains unchanged. She is also on Eliquis  for atrial fibrillation, a cholesterol medication, Veozah , asthma medications including an inhaler and Singulair , and levothyroxine  for hypothyroidism. She has occasional bladder control issues but has adequate medication for this.  She attends a pain clinic and receives oxycodone  and pregabalin , though she finds pregabalin  ineffective. She previously took gabapentin  in the hospital but is not currently using it. She remains physically active, using a treadmill at the gym, despite knee issues and plans for knee surgery.    Past Medical History:  Diagnosis Date   Acanthosis nigricans    Anxiety    ARDS (adult respiratory distress syndrome) (HCC) 2019   Respiratory Failure, had Trache / ECMO at [facility]   Arthritis    knees  (04/28/2017)   Asthma    Followed by Dr. Neysa (pulmonology); receives every other week omalizumab  injections; has frequent exacerbations   Back pain    Chronic diastolic CHF (congestive heart failure) (HCC) 01/17/2017   COPD (chronic obstructive pulmonary disease) (HCC)    PFTs in 2002, FEV1/FVC 65, no post bronchodilater test done   Depression    Dysrhythmia    A. Fib   GERD (gastroesophageal reflux disease)    Headache(784.0)    q couple days (04/28/2017)   Helicobacter pylori (H. pylori) infection    Hypertension, essential    Hypothyroidism    Insomnia    Joint pain    Lower extremity edema    Menorrhagia    Morbid obesity (HCC)    OSA on CPAP    Sleep study 2008 - mild OSA, not enough events to titrate CPAP; wears CPAP now/pt on 04/28/2017   Pneumonia X 1   Pre-diabetes    Rheumatoid arthritis (HCC)    Seasonal allergies    Shortness of breath    Tobacco user    Vitamin D  deficiency     Past Surgical History:  Procedure Laterality Date   CARDIOVERSION N/A 05/30/2017   Procedure: CARDIOVERSION;  Surgeon: Francyne Headland, MD;  Location: [facility];  Service: Cardiovascular;  Laterality: N/A;   COLONOSCOPY WITH ESOPHAGOGASTRODUODENOSCOPY (EGD)     for Pre- Bariatric surgery evaluation   EXTRACORPOREAL CIRCULATION  2019   For ARDS, Respiratory Failure   done at [facility]   GASTRIC ROUX-EN-Y N/A 11/17/2023   Procedure: LAPAROSCOPIC ROUX-EN-Y GASTRIC;  Surgeon: Signe Mitzie LABOR, MD;  Location: [facility];  Service: General;  Laterality: N/A;  LAPAROSCOPIC GASTRIC BYPASS  REDUCTION MAMMAPLASTY Bilateral 09/2011   TUBAL LIGATION  1996   bilateral   UPPER GI ENDOSCOPY N/A 11/17/2023   Procedure: ENDOSCOPY, UPPER GI TRACT;  Surgeon: Signe Mitzie LABOR, MD;  Location: [facility];  Service: General;  Laterality: N/A;    Family History  Problem Relation Age of Onset   Hypertension Mother    Asthma Daughter    Cancer Paternal Aunt    Asthma Maternal Grandmother     Social  History   Socioeconomic History   Marital status: Single    Spouse name: Not on file   Number of children: 1   Years of education: Not on file   Highest education level: Not on file  Occupational History   Occupation: stay at home  Tobacco Use   Smoking status: Former    Current packs/day: 0.00    Average packs/day: 0.5 packs/day for 26.0 years (13.0 ttl pk-yrs)    Types: Cigarettes    Start date: 09/11/1988    Quit date: 09/12/2014    Years since quitting: 9.2   Smokeless tobacco: Never   Tobacco comments:    Has vaped before, caught in the hospital  Vaping Use   Vaping status: Never Used  Substance and Sexual Activity   Alcohol use: No   Drug use: No   Sexual activity: Not Currently  Other Topics Concern   Not on file  Social History Narrative   Daughters are grown, not married, works as a Lawyer.   Social Drivers of Health   Financial Resource Strain: Medium Risk (11/20/2022)   Overall Financial Resource Strain (CARDIA)    Difficulty of Paying Living Expenses: Somewhat hard  Food Insecurity: No Food Insecurity (11/26/2023)   Hunger Vital Sign    Worried About Running Out of Food in the Last Year: Never true    Ran Out of Food in the Last Year: Never true  Transportation Needs: No Transportation Needs (11/26/2023)   PRAPARE - Administrator, Civil Service (Medical): No    Lack of Transportation (Non-Medical): No  Physical Activity: Insufficiently Active (11/20/2022)   Exercise Vital Sign    Days of Exercise per Week: 3 days    Minutes of Exercise per Session: 30 min  Stress: Stress Concern Present (11/20/2022)   [employer] of Occupational Health - Occupational Stress Questionnaire    Feeling of Stress : To some extent  Social Connections: Socially Isolated (11/20/2022)   Social Connection and Isolation Panel    Frequency of Communication with Friends and Family: More than three times a week    Frequency of Social Gatherings with Friends and Family:  Three times a week    Attends Religious Services: Never    Active Member of Clubs or Organizations: No    Attends Banker Meetings: Never    Marital Status: Never married    Allergies  Allergen Reactions   Other Shortness Of Breath and Other (See Comments)    Seasonal allergies- Wheezing   Contrast Media [Iodinated Contrast Media] Itching and Other (See Comments)    CT contrast    Outpatient Medications Prior to Visit  Medication Sig Dispense Refill   albuterol  (VENTOLIN  HFA) 108 (90 Base) MCG/ACT inhaler INHALE 2 PUFFS INTO THE LUNGS EVERY 6 HOURS AS NEEDED FOR WHEEZING OR SHORTNESS OF BREATH 8.5 g 3   apixaban  (ELIQUIS ) 5 MG TABS tablet Take 1 tablet (5 mg total) by mouth 2 (two) times daily. 60 tablet 1   atorvastatin  (LIPITOR) 20  MG tablet Take 1 tablet (20 mg total) by mouth daily. 90 tablet 1   carvedilol  (COREG ) 6.25 MG tablet Take 1 tablet (6.25 mg total) by mouth 2 (two) times daily with a meal. (Patient taking differently: Take 6.25 mg by mouth daily.) 180 tablet 1   clonazePAM  (KLONOPIN ) 0.5 MG tablet 1 or 2 tabs at bedtime as needed for sleep (Patient taking differently: Take 0.5 mg by mouth in the morning and at bedtime.) 60 tablet 5   cloNIDine  (CATAPRES ) 0.2 MG tablet TAKE 1 TABLET BY MOUTH TWICE DAILY (Patient taking differently: Take 0.2 mg by mouth at bedtime as needed (hot flashes).) 180 tablet 0   docusate sodium  (COLACE) 100 MG capsule Take 1 capsule (100 mg total) by mouth 2 (two) times daily. Okay to decrease to once daily or stop taking if having loose bowel movements (Patient taking differently: Take 100-200 mg by mouth daily as needed for mild constipation.) 30 capsule 0   Fezolinetant  (VEOZAH ) 45 MG TABS Take 1 tablet (45 mg total) by mouth daily. (Patient taking differently: Take 45 mg by mouth daily as needed (for hot flashes).) 90 tablet 1   fluticasone  (FLONASE ) 50 MCG/ACT nasal spray 1-2 puffs each nostril at bedtime while needed (Patient taking  differently: Place 1 spray into both nostrils 2 (two) times daily as needed for allergies or rhinitis.) 16 g 5   Fluticasone -Umeclidin-Vilant (TRELEGY ELLIPTA ) 200-62.5-25 MCG/ACT AEPB Inhale 1 puff into the lungs daily. 60 each 11   gabapentin  (NEURONTIN ) 100 MG capsule Take 1 capsule (100 mg total) by mouth every 12 (twelve) hours for 5 days. (Patient taking differently: Take 100 mg by mouth 2 (two) times daily as needed (for nerve pain).) 10 capsule 0   ipratropium-albuterol  (DUONEB) 0.5-2.5 (3) MG/3ML SOLN Take 3 mLs by nebulization every 4 (four) hours as needed (shortness of breath or wheezing). (Patient taking differently: Take 3 mLs by nebulization 3 (three) times daily as needed (shortness of breath or wheezing).) 360 mL 0   levothyroxine  (SYNTHROID ) 50 MCG tablet TAKE 1 TABLET(50 MCG) BY MOUTH DAILY BEFORE BREAKFAST (Patient taking differently: Take 50 mcg by mouth daily before breakfast.) 90 tablet 0   methocarbamol  (ROBAXIN ) 500 MG tablet Take 500 mg by mouth every 6 (six) hours as needed for muscle spasms.     montelukast  (SINGULAIR ) 10 MG tablet Take 1 tablet (10 mg total) by mouth at bedtime. 30 tablet 11   Multiple Vitamins-Minerals (BARIATRIC MULTIVITAMINS) CHEW Chew 1 tablet by mouth daily.     naloxone  (NARCAN ) nasal spray 4 mg/0.1 mL Place 1 spray into the nose once as needed (opioid overdose).     ondansetron  (ZOFRAN -ODT) 4 MG disintegrating tablet Take 1 tablet (4 mg total) by mouth every 6 (six) hours as needed for nausea or vomiting. (Patient taking differently: Take 4 mg by mouth every 6 (six) hours as needed for nausea or vomiting (dissolve orally).) 20 tablet 0   oxyCODONE -acetaminophen  (PERCOCET) 10-325 MG tablet Take 1 tablet by mouth See admin instructions. Take 1 tablet by mouth every 4-6 hours as needed for pain     pantoprazole  (PROTONIX ) 40 MG tablet Take 1 tablet (40 mg total) by mouth daily. Take this medication daily, regardless of reflux symptoms (Patient taking  differently: Take 40 mg by mouth daily. Take 40 mg by mouth once a day, regardless of reflux symptoms) 90 tablet 0   pregabalin  (LYRICA ) 75 MG capsule Take 75 mg by mouth 3 (three) times daily.     PROTEIN  PO Take 30 mLs by mouth in the morning.     solifenacin  (VESICARE ) 5 MG tablet TAKE 1 TABLET(5 MG) BY MOUTH DAILY (Patient taking differently: Take 5 mg by mouth at bedtime.) 90 tablet 1   Tezepelumab -ekko (TEZSPIRE ) 210 MG/1. SOAJ Inject 210 mg into the skin every 28 (twenty-eight) days. 1.91 mL 5   UNKNOWN TO PATIENT Take 1 tablet by mouth See admin instructions. Unnamed OTC bariatric vitamin - Chew 1 tablet by mouth three times a day     Vitamin D , Ergocalciferol , (DRISDOL ) 1.25 MG (50000 UNIT) CAPS capsule Take 1 capsule (50,000 Units total) by mouth every 7 (seven) days. (Patient taking differently: Take 50,000 Units by mouth every Sunday.) 12 capsule 1   zolpidem  (AMBIEN ) 10 MG tablet Take 10 mg by mouth at bedtime.     Facility-Administered Medications Prior to Visit  Medication Dose Route Frequency Provider Last Rate Last Admin   tezepelumab -ekko (TEZSPIRE ) 210 MG/1. syringe 210 mg  210 mg Subcutaneous Once Neysa Rama D, MD       tezepelumab -ekko (TEZSPIRE ) 210 MG/1. syringe 210 mg  210 mg Subcutaneous Once Neysa Rama D, MD         ROS Review of Systems  Constitutional:  Negative for activity change and appetite change.  HENT:  Negative for sinus pressure and sore throat.   Respiratory:  Negative for chest tightness, shortness of breath and wheezing.   Cardiovascular:  Negative for chest pain and palpitations.  Gastrointestinal:  Negative for abdominal distention, abdominal pain and constipation.  Genitourinary: Negative.   Musculoskeletal:        See HPI  Psychiatric/Behavioral:  Negative for behavioral problems and dysphoric mood.     Objective:  BP 97/65   Pulse 77   Ht 5' 5 (1.651 m)   Wt 291 lb 6.4 oz (132.2 kg)   SpO2 98%   BMI 48.49 kg/m       12/15/2023    4:12 PM 12/15/2023    3:26 PM 12/04/2023    3:49 PM  BP/Weight  Systolic BP 97 167 147  Diastolic BP 65 146 90  Wt. (Lbs)  291.4 299.8  BMI  48.49 kg/m2 49.89 kg/m2      Physical Exam Constitutional:      Appearance: She is well-developed.  Cardiovascular:     Rate and Rhythm: Normal rate.     Heart sounds: Normal heart sounds. No murmur heard. Pulmonary:     Effort: Pulmonary effort is normal.     Breath sounds: Normal breath sounds. No wheezing or rales.  Chest:     Chest wall: No tenderness.  Abdominal:     General: Bowel sounds are normal. There is no distension.     Palpations: Abdomen is soft. There is no mass.     Tenderness: There is no abdominal tenderness.  Musculoskeletal:        General: Normal range of motion.     Right lower leg: No edema.     Left lower leg: No edema.  Neurological:     Mental Status: She is alert and oriented to person, place, and time.  Psychiatric:        Mood and Affect: Mood normal.        Latest Ref Rng & Units 11/27/2023    5:08 AM 11/26/2023   12:41 PM 11/18/2023    4:43 AM  CMP  Glucose 70 - 99 mg/dL 68  68  95   BUN 6 - 20 mg/dL 11  15  10   Creatinine 0.44 - 1.00 mg/dL 9.38  9.21  9.36   Sodium 135 - 145 mmol/L 138  137  137   Potassium 3.5 - 5.1 mmol/L 3.4  3.6  3.5   Chloride 98 - 111 mmol/L 106  103  107   CO2 22 - 32 mmol/L 17  18  23    Calcium  8.9 - 10.3 mg/dL 8.3  9.1  7.9   Total Protein 6.5 - 8.1 g/dL 6.6  7.2  6.6   Total Bilirubin 0.0 - 1.2 mg/dL 0.7  0.8  0.5   Alkaline Phos 38 - 126 U/L 54  62  60   AST 15 - 41 U/L 14  16  33   ALT 0 - 44 U/L 15  15  28      Lipid Panel     Component Value Date/Time   CHOL 192 10/16/2021 0506   CHOL 238 (H) 03/30/2020 1128   TRIG 51 10/16/2021 0506   HDL 99 10/16/2021 0506   HDL 45 03/30/2020 1128   CHOLHDL 1.9 10/16/2021 0506   VLDL 10 10/16/2021 0506   LDLCALC 83 10/16/2021 0506   LDLCALC 167 (H) 03/30/2020 1128    CBC    Component Value  Date/Time   WBC 9.1 11/27/2023 0508   RBC 3.91 11/27/2023 0508   HGB 10.5 (L) 11/27/2023 0508   HGB 10.4 (L) 01/22/2018 1403   HCT 34.2 (L) 11/27/2023 0508   HCT 33.3 (L) 01/22/2018 1403   PLT 396 11/27/2023 0508   PLT 616 (H) 01/22/2018 1403   MCV 87.5 11/27/2023 0508   MCV 82 01/22/2018 1403   MCH 26.9 11/27/2023 0508   MCHC 30.7 11/27/2023 0508   RDW 16.4 (H) 11/27/2023 0508   RDW 17.1 (H) 01/22/2018 1403   LYMPHSABS 3.4 11/14/2023 1146   LYMPHSABS 3.3 (H) 01/22/2018 1403   MONOABS 1.1 (H) 11/14/2023 1146   EOSABS 0.3 11/14/2023 1146   EOSABS 0.3 01/22/2018 1403   BASOSABS 0.1 11/14/2023 1146   BASOSABS 0.1 01/22/2018 1403    Lab Results  Component Value Date   HGBA1C 5.9 12/15/2023    Lab Results  Component Value Date   TSH 4.020 05/20/2023       Assessment & Plan Status post gastric bypass surgery Experienced abdominal pain and vomiting post-surgery, requiring IV fluids. Symptoms improved. - Follow up with surgeon on January 13, 2024.  Hypertension associated with type 2 diabetes Blood pressure normalized to 97/65. Weight loss may affect blood pressure control. - Continue current antihypertensive medications. - Reassess blood pressure management at next visit. - As she loses weight we might need to taper her off some of her antihypertensives  Type 2 diabetes mellitus A1c well-controlled at 5.4. No current diabetes medication, likely due to previous steroid-induced hyperglycemia. -Currently on diet control - Continue monitoring blood glucose levels. - Refer to eye doctor for diabetic eye exam.  Hypothyroidism Well-managed on levothyroxine . Last thyroid function test normal in January 2025. - Continue current dose of levothyroxine . - Request thyroid function test during next lab work with surgeon as she declines labs today  Atrial fibrillation -Currently in sinus rhythm Well-managed on current medication regimen. - Continue Apixaban  as  prescribed.  Asthma - Continue current asthma medications including inhalers and Singulair .   Chronic pain syndrome Managed with oxycodone . Prefers gabapentin , but cannot take both. - Discuss medication efficacy with pain clinic at next visit. - Consider discontinuing pregabalin  if ineffective.  Vitamin D  deficiency, status  post supplementation -She has not had a recent vitamin D  test On weekly high-dose vitamin D  supplementation. Reassessment needed based on lab results. - Request vitamin D  level check during next lab work with surgeon as she declines labs today. - Continue current vitamin D  supplementation until levels are reassessed.      No orders of the defined types were placed in this encounter.   Follow-up: Return in about 3 months (around 03/16/2024) for Chronic medical conditions.       Corrina Sabin, MD, FAAFP. [facility] [place], [place] [phone]   12/15/2023, 6:00 PM

## 2023-12-16 ENCOUNTER — Other Ambulatory Visit (HOSPITAL_COMMUNITY): Payer: Self-pay

## 2023-12-22 ENCOUNTER — Other Ambulatory Visit: Payer: Self-pay | Admitting: Pharmacy Technician

## 2023-12-22 ENCOUNTER — Telehealth: Payer: Self-pay | Admitting: Dietician

## 2023-12-22 ENCOUNTER — Other Ambulatory Visit: Payer: Self-pay

## 2023-12-22 DIAGNOSIS — E669 Obesity, unspecified: Secondary | ICD-10-CM

## 2023-12-22 NOTE — Progress Notes (Signed)
 Specialty Pharmacy Refill Coordination Note  April [name] is a 53 y.o. female assessed today regarding refills of clinic administered specialty medication(s) Tezepelumab -ekko (Tezspire )   Clinic requested Courier to Provider Office   Delivery date: 12/25/23   Verified address: [facility] [address] [place], [place] [address]   Medication will be filled on 12/24/23. Appt on 9/4. Clean Claim $0.00.

## 2023-12-22 NOTE — Telephone Encounter (Signed)
 Pt called for the BELT program contact information. Pt also mentioned that chicken has not been well tolerated, stating tuna is tolerated better. Pt states she has been more active, but has not been pushing herself, stating sometimes she is weak and will still go to the gym in the evening. Pt is scheduled for an appointment with the surgeon's office next month. Dietitian reviewed soft prep plan with patient. Patient's questions were answered to patient's satisfaction, and patient express understanding.

## 2023-12-24 ENCOUNTER — Telehealth: Payer: Self-pay | Admitting: Family Medicine

## 2023-12-24 NOTE — Telephone Encounter (Signed)
 Semaglutide  (RYBELSUS )  ended on 07/09/2023. Medication is not an active medication in patient chart. Please advise

## 2023-12-24 NOTE — Telephone Encounter (Signed)
 Her surgeon discontinued this at discharge so she needs to check with her surgeon to ensure that they are okay with her taking it post gastric bypass surgery.

## 2023-12-24 NOTE — Telephone Encounter (Unsigned)
 Copied from CRM [id number]. Topic: Clinical - Medication Refill >> Dec 24, 2023  4:10 PM Yolanda T wrote: Medication: Semaglutide  (RYBELSUS ) 3 MG TABS  Has the patient contacted their pharmacy? No  This is the patient's preferred pharmacy:  [id number] - [place], [place] - [address] AT [facility] [address] [place] [address] Phone: [phone] Fax: [phone]  Is this the correct pharmacy for this prescription? Yes  Has the prescription been filled recently? No  Is the patient out of the medication? Yes  Has the patient been seen for an appointment in the last year OR does the patient have an upcoming appointment? Yes  Can we respond through MyChart? Yes  Agent: Please be advised that Rx refills may take up to 3 business days. We ask that you follow-up with your pharmacy.

## 2023-12-24 NOTE — Telephone Encounter (Signed)
 Patient is requesting to refill medication not listed on medication list: see below    Copied from CRM #[id number]. Topic: Clinical - Medication Refill >> Dec 24, 2023  4:10 PM Yolanda T wrote: Medication: Semaglutide  (RYBELSUS ) 3 MG TABS   Has the patient contacted their pharmacy? No   This is the patient's preferred pharmacy:  [id number] - [place], [place] - [address] AT [facility] [address] [place] [address] Phone: [phone] Fax: [phone]   Is this the correct pharmacy for this prescription? Yes   Has the prescription been filled recently? No   Is the patient out of the medication? Yes   Has the patient been seen for an appointment in the last year OR does the patient have an upcoming appointment? Yes   Can we respond through MyChart? Yes   Agent: Please be advised that Rx refills may take up to 3 business days. We ask that you follow-up with your pharmacy.

## 2023-12-26 ENCOUNTER — Telehealth: Payer: Self-pay | Admitting: Family Medicine

## 2023-12-26 NOTE — Telephone Encounter (Signed)
 Call to patient unable to reach VM left

## 2023-12-26 NOTE — Telephone Encounter (Signed)
 Copied from CRM [id number]. Topic: Clinical - Medication Refill >> Dec 26, 2023  5:00 PM Zebedee SAUNDERS wrote: Medication: Rybelsus   Has the patient contacted their pharmacy? Yes (Agent: If no, request that the patient contact the pharmacy for the refill. If patient does not wish to contact the pharmacy document the reason why and proceed with request.) (Agent: If yes, when and what did the pharmacy advise?)  This is the patient's preferred pharmacy:  [id number] - [place], [place] - [address] AT [facility] OF [address] [place] [address] Phone: [phone] Fax: [phone]  Is this the correct pharmacy for this prescription? Yes If no, delete pharmacy and type the correct one.   Has the prescription been filled recently? No  Is the patient out of the medication? Yes  Has the patient been seen for an appointment in the last year OR does the patient have an upcoming appointment? Yes  Can we respond through MyChart? Yes  Agent: Please be advised that Rx refills may take up to 3 business days. We ask that you follow-up with your pharmacy.

## 2023-12-26 NOTE — Telephone Encounter (Signed)
 Pt states that she would reach out to surgeon and get back with us .

## 2023-12-27 ENCOUNTER — Other Ambulatory Visit: Payer: Self-pay | Admitting: Family Medicine

## 2023-12-29 NOTE — Telephone Encounter (Signed)
 Refer to telephone thread from 12/24/23. Patient was directed by PCP to communicate this request with her surgeon who completed gastric bypass surgery. Unsure if patient has spoken with that provider. Will forward request to PCP's CMA.   Alycia,   Have you received anything from her surgeon indicating whether or not it is okay for pt to restart this medication?

## 2023-12-30 NOTE — Telephone Encounter (Signed)
 Patient has not confirmed if she has spoken to her surgeon

## 2023-12-31 ENCOUNTER — Other Ambulatory Visit: Payer: Self-pay | Admitting: Family Medicine

## 2024-01-01 ENCOUNTER — Ambulatory Visit

## 2024-01-01 VITALS — BP 124/79 | HR 80 | Temp 99.2°F | Resp 16 | Ht 65.0 in | Wt 278.6 lb

## 2024-01-01 DIAGNOSIS — J4552 Severe persistent asthma with status asthmaticus: Secondary | ICD-10-CM

## 2024-01-01 DIAGNOSIS — J4489 Other specified chronic obstructive pulmonary disease: Secondary | ICD-10-CM

## 2024-01-01 MED ORDER — TEZEPELUMAB-EKKO 210 MG/1.91ML ~~LOC~~ SOSY
210.0000 mg | PREFILLED_SYRINGE | Freq: Once | SUBCUTANEOUS | Status: AC
  Administered 2024-01-01: 210 mg via SUBCUTANEOUS
  Filled 2024-01-01: qty 1.91

## 2024-01-01 NOTE — Progress Notes (Signed)
 Diagnosis: Asthma  Provider:  Mannam, Praveen MD  Procedure: Injection  Tezspire  (Tezepelumab ), Dose: 210 mg, Site: subcutaneous, Number of injections: 1  Injection Site(s): Left arm  Post Care: Patient declined observation  Discharge: Condition: Good, Destination: Home . AVS Declined  Performed by:  Eleanor DELENA Bloch, RN

## 2024-01-01 NOTE — Telephone Encounter (Signed)
 Requested Prescriptions  Refused Prescriptions Disp Refills   Potassium Chloride  ER 20 MEQ TBCR [Pharmacy Med Name: POTASSIUM CHLORIDE  ER TABLETS] 90 tablet 1    Sig: TAKE 1 TABLET(20 MEQ) BY MOUTH DAILY     Endocrinology:  Minerals - Potassium Supplementation Failed - 01/01/2024  1:18 PM      Failed - K in normal range and within 360 days    Potassium  Date Value Ref Range Status  11/27/2023 3.4 (L) 3.5 - 5.1 mmol/L Final         Passed - Cr in normal range and within 360 days    Creat  Date Value Ref Range Status  05/21/2016 0.56 0.50 - 1.10 mg/dL Final   Creatinine, Ser  Date Value Ref Range Status  11/27/2023 0.61 0.44 - 1.00 mg/dL Final         Passed - Valid encounter within last 12 months    Recent Outpatient Visits           2 weeks ago Type 2 diabetes mellitus with other specified complication, without long-term current use of insulin  (HCC)   [facility] [place]. [facility] Delbert Clam, MD   7 months ago Morbid obesity due to excess calories [facility])   [facility] Dept Of [place]. [facility] Delbert Clam, MD   11 months ago Tremor   [facility] Dept Of [place]. [facility] Delbert Clam, MD   1 year ago Type 2 diabetes mellitus with other specified complication, without long-term current use of insulin  [facility])   [facility] A Dept Of [place]. [facility] Delbert Clam, MD   1 year ago Encounter for medication review and counseling   [facility] Dept Of [place]. [facility] Fleeta Tonia Garnette LITTIE, RPH-CPP       Future Appointments             In 1 week Jerri Kay HERO, MD [facility])

## 2024-01-07 ENCOUNTER — Other Ambulatory Visit: Payer: Self-pay | Admitting: Family Medicine

## 2024-01-07 DIAGNOSIS — N39498 Other specified urinary incontinence: Secondary | ICD-10-CM

## 2024-01-08 NOTE — Telephone Encounter (Signed)
 Requested Prescriptions  Pending Prescriptions Disp Refills   solifenacin  (VESICARE ) 5 MG tablet [Pharmacy Med Name: SOLIFENACIN  5MG  TABLETS] 90 tablet 1    Sig: TAKE 1 TABLET(5 MG) BY MOUTH DAILY     Urology:  Bladder Agents 2 Failed - 01/08/2024  2:57 PM      Failed - AST in normal range and within 360 days    AST  Date Value Ref Range Status  11/27/2023 14 (L) 15 - 41 U/L Final         Passed - Cr in normal range and within 360 days    Creat  Date Value Ref Range Status  05/21/2016 0.56 0.50 - 1.10 mg/dL Final   Creatinine, Ser  Date Value Ref Range Status  11/27/2023 0.61 0.44 - 1.00 mg/dL Final         Passed - ALT in normal range and within 360 days    ALT  Date Value Ref Range Status  11/27/2023 15 0 - 44 U/L Final         Passed - Valid encounter within last 12 months    Recent Outpatient Visits           3 weeks ago Type 2 diabetes mellitus with other specified complication, without long-term current use of insulin  (HCC)   [facility] [place]. [facility] Delbert Clam, MD   7 months ago Morbid obesity due to excess calories [facility])   [facility] Dept Of [place]. [facility] Delbert Clam, MD   11 months ago Tremor   [facility] Dept Of [place]. [facility] Delbert Clam, MD   1 year ago Type 2 diabetes mellitus with other specified complication, without long-term current use of insulin  [facility])   [facility] A Dept Of [place]. [facility] Delbert Clam, MD   1 year ago Encounter for medication review and counseling   [facility] Dept Of [place]. [facility] Fleeta Tonia Garnette LITTIE, RPH-CPP       Future Appointments             In 5 days Jerri Kay HERO, MD [facility]

## 2024-01-12 ENCOUNTER — Other Ambulatory Visit: Payer: Self-pay

## 2024-01-12 DIAGNOSIS — M1712 Unilateral primary osteoarthritis, left knee: Secondary | ICD-10-CM

## 2024-01-12 DIAGNOSIS — M1711 Unilateral primary osteoarthritis, right knee: Secondary | ICD-10-CM

## 2024-01-13 ENCOUNTER — Ambulatory Visit (INDEPENDENT_AMBULATORY_CARE_PROVIDER_SITE_OTHER): Admitting: Orthopaedic Surgery

## 2024-01-13 DIAGNOSIS — M170 Bilateral primary osteoarthritis of knee: Secondary | ICD-10-CM | POA: Diagnosis not present

## 2024-01-13 DIAGNOSIS — M1712 Unilateral primary osteoarthritis, left knee: Secondary | ICD-10-CM

## 2024-01-13 DIAGNOSIS — M1711 Unilateral primary osteoarthritis, right knee: Secondary | ICD-10-CM

## 2024-01-13 MED ORDER — SODIUM HYALURONATE 60 MG/3ML IX PRSY
60.0000 mg | PREFILLED_SYRINGE | INTRA_ARTICULAR | Status: AC | PRN
  Administered 2024-01-13: 60 mg via INTRA_ARTICULAR

## 2024-01-13 NOTE — Progress Notes (Signed)
   Procedure Note  Patient: April [name]             Date of Birth: 1971/04/01           MRN: [id number]             Visit Date: 01/13/2024  Procedures: Visit Diagnoses:  1. Primary osteoarthritis of right knee   2. Primary osteoarthritis of left knee     Large Joint Inj: bilateral knee on 01/13/2024 1:36 PM Indications: pain Details: 22 G needle  Arthrogram: No  Medications (Right): 60 mg Sodium Hyaluronate 60 MG/3ML Medications (Left): 60 mg Sodium Hyaluronate 60 MG/3ML Outcome: tolerated well, no immediate complications Patient was prepped and draped in the usual sterile fashion.

## 2024-01-14 ENCOUNTER — Other Ambulatory Visit: Payer: Self-pay

## 2024-01-14 ENCOUNTER — Other Ambulatory Visit: Payer: Self-pay | Admitting: Family Medicine

## 2024-01-14 NOTE — Progress Notes (Signed)
 Specialty Pharmacy Refill Coordination Note  April [name] is a 53 y.o. female assessed today regarding refills of clinic administered specialty medication(s) Tezepelumab -ekko (Tezspire )   Clinic requested Courier to Provider Office   Delivery date: 01/27/24   Verified address: [facility] [address] [place], [place] [address]   Medication will be filled on 09.29.25.     Copay: $0.00 Appointment: 10.02.25

## 2024-01-19 ENCOUNTER — Other Ambulatory Visit: Payer: Self-pay | Admitting: Internal Medicine

## 2024-01-21 ENCOUNTER — Other Ambulatory Visit: Payer: Self-pay | Admitting: Family Medicine

## 2024-01-21 ENCOUNTER — Ambulatory Visit: Payer: Self-pay

## 2024-01-21 NOTE — Telephone Encounter (Signed)
 FYI Only or Action Required?: FYI only for provider. FYI Only or Action Required?: Action required by provider: clinical question for provider, update on patient condition, and lab or test result follow-up needed.  Patient was last seen in primary care on 12/15/2023 by Newlin, Enobong, MD.  Called Nurse Triage reporting Allergic Reaction.  Symptoms began several days ago.  Interventions attempted: OTC medications: Aveeno and Aquaphor and Ice/heat application.  Symptoms are: gradually worsening.  Triage Disposition: See Physician Within 24 Hours  Patient/caregiver understands and will follow disposition?: YesNo appts available., Pt agreeable to go to UC tomorrow.   FYI- Pt expressed her concern about not being on Rebelsys and Potassium.  She says she feels like she should be on something because she is fatigue at times. Pt is s/p Gastric Bypass on 7/21 and says that she can feel her body going through some changes.  Pt was advised by general surgery to f/u with PCP regarding starting those meds.  Pt has appt with dietary and nutrition on 10/2 and next PCP f/u in November.   Copied from CRM #[id number]. Topic: Clinical - Red Word Triage >> Jan 21, 2024  4:19 PM Darshell M wrote: Red Word that prompted transfer to Nurse Triage: Allergic reaction. Woke up with black spots on face and pink eye. Using cream and getting better but not sure what caused it. Had gastric bypass 11-17-23   ----------------------------------------------------------------------- From previous Reason for Contact - Scheduling: Patient/patient representative is calling to schedule an appointment. Refer to attachments for appointment information. Reason for Disposition  Hives or itching  Answer Assessment - Initial Assessment Questions 1. APPEARANCE of RASH: What does the rash look like? (e.g., spots, blisters, raised areas, skin peeling, scaly)     Black spots, feels scaly  2. SIZE: How big are the spots? (e.g., tip  of pen, eraser, coin; inches, centimeters)     Several spots the size of a quarter  3. LOCATION: Where is the rash located?     All over face  4. COLOR: What color is the rash? (Note: It is difficult to assess rash color in people with darker-colored skin. When this situation occurs, simply ask the caller to describe what they see.)     Unsure of where they came from  5. ONSET: When did the rash begin?     Has been noticeable since Sunday, 4 days ago  6. FEVER: Do you have a fever? If Yes, ask: What is your temperature, how was it measured, and when did it start?     Unsure of fever  7. ITCHING: Does the rash itch? If Yes, ask: How bad is the itch? (Scale 1-10; or mild, moderate, severe)     Mild to moderate itchiness, improving with Aveeno and Aquaphor  8. CAUSE: What do you think is causing the rash?     Unsure of exact cause, but did recently change body wash but has used the same brand in different fragrances.    9. NEW MEDICINES: What new medicines are you taking? (e.g., name of antibiotic) When did you start taking this medication?SABRA     No  10. OTHER SYMPTOMS: Do you have any other symptoms? (e.g., sore throat, fever, joint pain)       Also endorsing pink eye symptoms x 1 week  11. PREGNANCY: Is there any chance you are pregnant? When was your last menstrual period?       Lmp-End of Last Month  Protocols used: Rash - Widespread On  Drugs-A-AH

## 2024-01-23 ENCOUNTER — Emergency Department (HOSPITAL_COMMUNITY)

## 2024-01-23 ENCOUNTER — Emergency Department (HOSPITAL_COMMUNITY)
Admission: EM | Admit: 2024-01-23 | Discharge: 2024-01-23 | Disposition: A | Discharge status: Home / Self Care | Attending: Emergency Medicine | Admitting: Emergency Medicine

## 2024-01-23 ENCOUNTER — Other Ambulatory Visit: Payer: Self-pay

## 2024-01-23 DIAGNOSIS — R1011 Right upper quadrant pain: Secondary | ICD-10-CM | POA: Insufficient documentation

## 2024-01-23 DIAGNOSIS — R109 Unspecified abdominal pain: Secondary | ICD-10-CM

## 2024-01-23 DIAGNOSIS — Z7901 Long term (current) use of anticoagulants: Secondary | ICD-10-CM | POA: Diagnosis not present

## 2024-01-23 LAB — COMPREHENSIVE METABOLIC PANEL WITH GFR
ALT: 18 U/L (ref 0–44)
AST: 24 U/L (ref 15–41)
Albumin: 3.8 g/dL (ref 3.5–5.0)
Alkaline Phosphatase: 77 U/L (ref 38–126)
Anion gap: 12 (ref 5–15)
BUN: 16 mg/dL (ref 6–20)
CO2: 22 mmol/L (ref 22–32)
Calcium: 9.4 mg/dL (ref 8.9–10.3)
Chloride: 103 mmol/L (ref 98–111)
Creatinine, Ser: 0.8 mg/dL (ref 0.44–1.00)
GFR, Estimated: >60 mL/min (ref >60)
Glucose, Bld: 107 mg/dL — ABNORMAL HIGH (ref 70–99)
Potassium: 3.5 mmol/L (ref 3.5–5.1)
Sodium: 137 mmol/L (ref 135–145)
Total Bilirubin: 0.2 mg/dL (ref 0.0–1.2)
Total Protein: 7.4 g/dL (ref 6.5–8.1)

## 2024-01-23 LAB — URINALYSIS, ROUTINE W REFLEX MICROSCOPIC
Bilirubin Urine: NEGATIVE
Glucose, UA: NEGATIVE mg/dL
Hgb urine dipstick: NEGATIVE
Ketones, ur: 20 mg/dL — AB
Leukocytes,Ua: NEGATIVE
Nitrite: NEGATIVE
Protein, ur: NEGATIVE mg/dL
Specific Gravity, Urine: 1.027 (ref 1.005–1.030)
pH: 5 (ref 5.0–8.0)

## 2024-01-23 LAB — CBC
HCT: 44.1 % (ref 36.0–46.0)
Hemoglobin: 13.9 g/dL (ref 12.0–15.0)
MCH: 26.8 pg (ref 26.0–34.0)
MCHC: 31.5 g/dL (ref 30.0–36.0)
MCV: 85.1 fL (ref 80.0–100.0)
Platelets: 397 K/uL (ref 150–400)
RBC: 5.18 MIL/uL — ABNORMAL HIGH (ref 3.87–5.11)
RDW: 15.8 % — ABNORMAL HIGH (ref 11.5–15.5)
WBC: 10.4 K/uL (ref 4.0–10.5)
nRBC: 0 % (ref 0.0–0.2)

## 2024-01-23 LAB — LIPASE, BLOOD: Lipase: 24 U/L (ref 11–51)

## 2024-01-23 MED ORDER — LACTATED RINGERS IV BOLUS
1000.0000 mL | Freq: Once | INTRAVENOUS | Status: AC
  Administered 2024-01-23: 1000 mL via INTRAVENOUS

## 2024-01-23 MED ORDER — HYDROMORPHONE HCL 1 MG/ML IJ SOLN
1.0000 mg | Freq: Once | INTRAMUSCULAR | Status: AC
  Administered 2024-01-23: 1 mg via INTRAVENOUS
  Filled 2024-01-23: qty 1

## 2024-01-23 MED ORDER — ONDANSETRON 8 MG PO TBDP: 8.0000 mg | ORAL_TABLET | Freq: Three times a day (TID) | ORAL | 0 refills | Status: AC | PRN

## 2024-01-23 NOTE — Telephone Encounter (Signed)
 Patient was called and she states that she will be going to the ED today.

## 2024-01-23 NOTE — ED Provider Notes (Signed)
 [place] EMERGENCY DEPARTMENT AT [facility] Provider Note   CSN: [id number] Arrival date & time: 01/23/24  1502     Patient presents with: Abdominal Pain   April [name] is a 53 y.o. female.   53 year old female who presents with 1 week history of right upper quadrant and epigastric abdominal pain.  She has had nonbilious emesis associated with this.  States that her symptoms are worse in the morning.  Notes that she had gastric bypass surgery about 2 months ago.  Denies any fever.  Has been passing flatus.  No treatment use prior to arrival       Prior to Admission medications   Medication Sig Start Date End Date Taking? Authorizing Provider  albuterol  (VENTOLIN  HFA) 108 (90 Base) MCG/ACT inhaler INHALE 2 PUFFS INTO THE LUNGS EVERY 6 HOURS AS NEEDED FOR WHEEZING OR SHORTNESS OF BREATH 01/20/24   Young, Reggy D, MD  apixaban  (ELIQUIS ) 5 MG TABS tablet Take 1 tablet (5 mg total) by mouth 2 (two) times daily. 11/19/23   Signe Mitzie LABOR, MD  atorvastatin  (LIPITOR) 20 MG tablet TAKE 1 TABLET(20 MG) BY MOUTH DAILY 01/15/24   Newlin, Enobong, MD  carvedilol  (COREG ) 6.25 MG tablet Take 1 tablet (6.25 mg total) by mouth 2 (two) times daily with a meal. Patient taking differently: Take 6.25 mg by mouth daily. 02/05/23   Newlin, Enobong, MD  clonazePAM  (KLONOPIN ) 0.5 MG tablet 1 or 2 tabs at bedtime as needed for sleep Patient taking differently: Take 0.5 mg by mouth in the morning and at bedtime. 09/08/23   Neysa Reggy D, MD  cloNIDine  (CATAPRES ) 0.2 MG tablet TAKE 1 TABLET BY MOUTH TWICE DAILY 01/21/24   Newlin, Enobong, MD  fluticasone  (FLONASE ) 50 MCG/ACT nasal spray 1-2 puffs each nostril at bedtime while needed Patient taking differently: Place 1 spray into both nostrils 2 (two) times daily as needed for allergies or rhinitis. 09/08/23   Neysa Reggy BIRCH, MD  Fluticasone -Umeclidin-Vilant (TRELEGY ELLIPTA ) 200-62.5-25 MCG/ACT AEPB Inhale 1 puff into the lungs daily. 06/09/23    Neysa Reggy D, MD  gabapentin  (NEURONTIN ) 100 MG capsule Take 1 capsule (100 mg total) by mouth every 12 (twelve) hours for 5 days. Patient taking differently: Take 100 mg by mouth 2 (two) times daily as needed (for nerve pain). 11/19/23 12/15/23  Signe Mitzie LABOR, MD  ipratropium-albuterol  (DUONEB) 0.5-2.5 (3) MG/3ML SOLN Take 3 mLs by nebulization every 4 (four) hours as needed (shortness of breath or wheezing). Patient taking differently: Take 3 mLs by nebulization 3 (three) times daily as needed (shortness of breath or wheezing). 01/26/23 12/15/23  Laurence Locus, DO  levothyroxine  (SYNTHROID ) 50 MCG tablet TAKE 1 TABLET(50 MCG) BY MOUTH DAILY BEFORE BREAKFAST Patient taking differently: Take 50 mcg by mouth daily before breakfast. 11/26/23   Newlin, Enobong, MD  methocarbamol  (ROBAXIN ) 500 MG tablet Take 500 mg by mouth every 6 (six) hours as needed for muscle spasms.    [provider]  montelukast  (SINGULAIR ) 10 MG tablet Take 1 tablet (10 mg total) by mouth at bedtime. 06/09/23   Neysa Reggy BIRCH, MD  Multiple Vitamins-Minerals (BARIATRIC MULTIVITAMINS) CHEW Chew 1 tablet by mouth daily.    [provider]  naloxone  (NARCAN ) nasal spray 4 mg/0.1 mL Place 1 spray into the nose once as needed (opioid overdose). 08/09/20   [provider]  ondansetron  (ZOFRAN -ODT) 4 MG disintegrating tablet Take 1 tablet (4 mg total) by mouth every 6 (six) hours as needed for nausea or  vomiting. Patient taking differently: Take 4 mg by mouth every 6 (six) hours as needed for nausea or vomiting (dissolve orally). 11/19/23   Signe Mitzie LABOR, MD  oxyCODONE -acetaminophen  (PERCOCET) 10-325 MG tablet Take 1 tablet by mouth See admin instructions. Take 1 tablet by mouth every 4-6 hours as needed for pain    [provider]  pantoprazole  (PROTONIX ) 40 MG tablet Take 1 tablet (40 mg total) by mouth daily. Take this medication daily, regardless of reflux symptoms Patient taking differently:  Take 40 mg by mouth daily. Take 40 mg by mouth once a day, regardless of reflux symptoms 11/19/23   Signe Mitzie LABOR, MD  pregabalin  (LYRICA ) 75 MG capsule Take 75 mg by mouth 3 (three) times daily.    [provider]  PROTEIN PO Take 30 mLs by mouth in the morning.    [provider]  solifenacin  (VESICARE ) 5 MG tablet TAKE 1 TABLET(5 MG) BY MOUTH DAILY 01/08/24   Newlin, Enobong, MD  Tezepelumab -ekko (TEZSPIRE ) 210 MG/1. SOAJ Inject 210 mg into the skin every 28 (twenty-eight) days. 10/14/23   Neysa Reggy BIRCH, MD  UNKNOWN TO PATIENT Take 1 tablet by mouth See admin instructions. Unnamed OTC bariatric vitamin - Chew 1 tablet by mouth three times a day    [provider]  VEOZAH  45 MG TABS TAKE 1 TABLET(45 MG) BY MOUTH DAILY 12/29/23   Newlin, Enobong, MD  Vitamin D , Ergocalciferol , (DRISDOL ) 1.25 MG (50000 UNIT) CAPS capsule Take 1 capsule (50,000 Units total) by mouth every 7 (seven) days. Patient taking differently: Take 50,000 Units by mouth every Sunday. 07/07/23   Newlin, Enobong, MD  zolpidem  (AMBIEN ) 10 MG tablet Take 10 mg by mouth at bedtime. 10/30/23   [provider]  mometasone -formoterol  (DULERA ) 200-5 MCG/ACT AERO Inhale 2 puffs into the lungs 2 (two) times daily. Dx: Asthma 04/08/19 08/12/19  Neysa Reggy BIRCH, MD    Allergies: Other and Contrast media [iodinated contrast media]    Review of Systems  All other systems reviewed and are negative.   Updated Vital Signs BP 120/70 (BP Location: Right Arm)   Pulse 98   Temp 98.4 F (36.9 C) (Oral)   Resp 18   Ht 1.651 m (5' 5)   SpO2 99%   BMI 46.36 kg/m   Physical Exam Vitals and nursing note reviewed.  Constitutional:      General: She is not in acute distress.    Appearance: Normal appearance. She is well-developed. She is not toxic-appearing.  HENT:     Head: Normocephalic and atraumatic.  Eyes:     General: Lids are normal.     Conjunctiva/sclera: Conjunctivae normal.     Pupils:  Pupils are equal, round, and reactive to light.  Neck:     Thyroid: No thyroid mass.     Trachea: No tracheal deviation.  Cardiovascular:     Rate and Rhythm: Normal rate and regular rhythm.     Heart sounds: Normal heart sounds. No murmur heard.    No gallop.  Pulmonary:     Effort: Pulmonary effort is normal. No respiratory distress.     Breath sounds: Normal breath sounds. No stridor. No decreased breath sounds, wheezing, rhonchi or rales.  Abdominal:     General: There is no distension.     Palpations: Abdomen is soft.     Tenderness: There is no abdominal tenderness. There is no rebound.   Musculoskeletal:        General: No tenderness. Normal range  of motion.     Cervical back: Normal range of motion and neck supple.  Skin:    General: Skin is warm and dry.     Findings: No abrasion or rash.  Neurological:     Mental Status: She is alert and oriented to person, place, and time. Mental status is at baseline.     GCS: GCS eye subscore is 4. GCS verbal subscore is 5. GCS motor subscore is 6.     Cranial Nerves: No cranial nerve deficit.     Sensory: No sensory deficit.     Motor: Motor function is intact.  Psychiatric:        Attention and Perception: Attention normal.        Speech: Speech normal.        Behavior: Behavior normal.     (all labs ordered are listed, but only abnormal results are displayed) Labs Reviewed  URINALYSIS, ROUTINE W REFLEX MICROSCOPIC - Abnormal; Notable for the following components:      Result Value   Color, Urine AMBER (*)    APPearance CLOUDY (*)    Ketones, ur 20 (*)    All other components within normal limits  CBC - Abnormal; Notable for the following components:   RBC 5.18 (*)    RDW 15.8 (*)    All other components within normal limits  COMPREHENSIVE METABOLIC PANEL WITH GFR  LIPASE, BLOOD    EKG: None  Radiology: No results found.   Procedures   Medications Ordered in the ED - No data to display                                   Medical Decision Making Amount and/or Complexity of Data Reviewed Labs: ordered. Radiology: ordered.  Risk Prescription drug management.   Patient resents abdominal pain concern for possible intestinal hernia.  Abdominal CT showed no evidence of obstruction.  Labs are reassuring here.  Medicated with pain medication as well and has IV fluids and feels better.  Will discharge home     Final diagnoses:  None    ED Discharge Orders     None          Dasie Faden, MD 01/23/24 2042

## 2024-01-23 NOTE — ED Triage Notes (Addendum)
 Patient c/o RUQ abdominal pain x 1 week. Patient report taking muscle relaxant for pain without relief. Patient report nausea , denies vomiting and diarrhea x 3 yesterday. Patient denies dysuria. Patient denies fever at home. Hx Gastric Bypass 2 months ago.

## 2024-01-26 ENCOUNTER — Other Ambulatory Visit: Payer: Self-pay

## 2024-01-27 ENCOUNTER — Other Ambulatory Visit: Payer: Self-pay | Admitting: Family Medicine

## 2024-01-29 ENCOUNTER — Encounter: Payer: Self-pay | Admitting: Dietician

## 2024-01-29 ENCOUNTER — Ambulatory Visit (INDEPENDENT_AMBULATORY_CARE_PROVIDER_SITE_OTHER): Admitting: *Deleted

## 2024-01-29 ENCOUNTER — Other Ambulatory Visit: Payer: Self-pay | Admitting: Internal Medicine

## 2024-01-29 ENCOUNTER — Encounter: Attending: Surgery | Admitting: Dietician

## 2024-01-29 VITALS — BP 130/83 | HR 75 | Temp 98.6°F | Resp 18 | Ht 65.0 in | Wt 268.4 lb

## 2024-01-29 DIAGNOSIS — J4552 Severe persistent asthma with status asthmaticus: Secondary | ICD-10-CM

## 2024-01-29 DIAGNOSIS — E669 Obesity, unspecified: Secondary | ICD-10-CM | POA: Diagnosis present

## 2024-01-29 DIAGNOSIS — Z713 Dietary counseling and surveillance: Secondary | ICD-10-CM | POA: Diagnosis not present

## 2024-01-29 DIAGNOSIS — Z6841 Body Mass Index (BMI) 40.0 and over, adult: Secondary | ICD-10-CM | POA: Diagnosis not present

## 2024-01-29 DIAGNOSIS — J4489 Other specified chronic obstructive pulmonary disease: Secondary | ICD-10-CM

## 2024-01-29 MED ORDER — TEZEPELUMAB-EKKO 210 MG/1.91ML ~~LOC~~ SOSY
210.0000 mg | PREFILLED_SYRINGE | Freq: Once | SUBCUTANEOUS | Status: AC
  Administered 2024-01-29: 210 mg via SUBCUTANEOUS
  Filled 2024-01-29: qty 1.91

## 2024-01-29 NOTE — Telephone Encounter (Signed)
 Copied from CRM [id number]. Topic: Clinical - Medication Refill >> Jan 29, 2024  3:47 PM Leila C wrote: Most Recent Pulmonary Care Visit:  Provider: Dr. Reggy Salt Department: [facility] Pulmonary Care Date: 09/08/23 Medication(s): zolpidem  (AMBIEN ) 10 MG tablet  Has the patient contacted their pharmacy? Yes (Agent: If no, request that the patient contact the pharmacy for the refill. If patient does not wish to contact the pharmacy document the reason why and proceed with request.) (Agent: If yes, when and what did the pharmacy advise?)  This is the patient's preferred pharmacy:  [id number] - [place], [place] - [address] AT [facility] OF [address] [place] [address] Phone: [phone] Fax: [phone]  Is this the correct pharmacy for this prescription? Yes If no, delete pharmacy and type the correct one.   Has the prescription been filled recently? No  Is the patient out of the medication? Yes  Has the patient been seen for an appointment in the last year OR does the patient have an upcoming appointment? Yes 03/11/24  Can we respond through MyChart? No, please call [phone]  Agent: Please be advised that Rx refills may take up to 3 business days. We ask that you follow-up with your pharmacy.

## 2024-01-29 NOTE — Telephone Encounter (Unsigned)
 Copied from CRM [id number]. Topic: Clinical - Medication Refill >> Jan 29, 2024  3:47 PM Leila C wrote: Most Recent Pulmonary Care Visit:  Provider: Dr. Reggy Salt Department: [facility] Pulmonary Care Date: 09/08/23 Medication(s): zolpidem  (AMBIEN ) 10 MG tablet  Has the patient contacted their pharmacy? Yes (Agent: If no, request that the patient contact the pharmacy for the refill. If patient does not wish to contact the pharmacy document the reason why and proceed with request.) (Agent: If yes, when and what did the pharmacy advise?)  This is the patient's preferred pharmacy:  [id number] - [place], [place] - [address] AT [facility] OF [address] & [address] [place] [address] Phone: [phone] Fax: [phone]  Is this the correct pharmacy for this prescription? Yes If no, delete pharmacy and type the correct one.   Has the prescription been filled recently? No  Is the patient out of the medication? Yes  Has the patient been seen for an appointment in the last year OR does the patient have an upcoming appointment? Yes 03/11/24  Can we respond through MyChart? No, please call [phone]  Agent: Please be advised that Rx refills may take up to 3 business days. We ask that you follow-up with your pharmacy. >> Jan 29, 2024  3:59 PM Chantha C wrote: I cannot reorder zolpidem  (AMBIEN ) 10 MG tablet in medication refill medication management. I noted this is the comments.

## 2024-01-29 NOTE — Progress Notes (Signed)
 Diagnosis: Asthma  Provider:  Mannam, Praveen MD  Procedure: Injection  Tezspire  (Tezepelumab ), Dose: 210 mg, Site: subcutaneous, Number of injections: 1  Injection Site(s): Right arm  Post Care: Patient declined observation  Discharge: Condition: Good, Destination: Home . AVS Declined  Performed by:  Mathew Therisa NOVAK, RN

## 2024-01-29 NOTE — Progress Notes (Signed)
 Bariatric Nutrition Follow-Up Visit Medical Nutrition Therapy  Appt Start Time: 1355   End Time: 1432  Surgery date: 11/17/2023 Surgery type: RYGB  NUTRITION ASSESSMENT  Anthropometrics  Start weight at [facility]: 323.9 lbs (date: 05/01/2023)  Height: 65 in Weight today: 301.7 lbs   Clinical   Pharmacotherapy: History of weight loss medication used: phentermine , Ozempic  (not tolerated)  Medical hx: asthma, COPD, HTN Medications:  omeprazole , gabapentin , albuterol , clonazepam , epinephrine , famotidine , Flonase , Rybelsus , Ambien , vit D, calcium   Labs: Chol 214; Triglycerides 171; LDL 127; A1c 5.8, folate 2.4, Vit D 25.5 Notable signs/symptoms: walking slow Any previous deficiencies? No Bowel Habits: Every day to every other day no complaints   Body Composition Scale 12/02/2023 01/29/2024  Current Body Weight 301.7 268.2  Total Body Fat % 49.3 46.8  Visceral Fat 21 18  Fat-Free Mass % 50.6 53.1   Total Body Water  % 39.8 41.0  Muscle-Mass lbs 32.4 32.1  BMI 49.9 44.3  Body Fat Displacement           Torso  lbs 92.4 78.0         Left Leg  lbs 18.4 15.6         Right Leg  lbs 18.4 15.6         Left Arm  lbs 9.2 7.8         Right Arm  lbs 9.2 7.8     Lifestyle & Dietary Hx  Pt states she went to the ER last Friday, stating the hydration clinic was closed. Pt states some weeks are good and some weeks are bad, stating she can't get fluids and food down sometimes, especially last week. Pt states she is in bed about 12 midnight, stating she is up about 6 am, and sips protein shake until about 10-11 am while in bed.  Estimated daily fluid intake: 64 oz Estimated daily protein intake: 40-60 g Supplements: multivitamin and calcium  Current average weekly physical activity: gym 5 days per week, for 40-90 minutes  24-Hr Dietary Recall First Meal: 6 am - 11 am protein shake (30 grams) Snack: applesauce, greek  Second Meal:  Snack: greek yogurt or applesauce  Third Meal: applesauce,  protein shake or tuna fish Snack:  Beverages: water   Post-Op Goals/ Signs/ Symptoms Using straws: no Drinking while eating: no Chewing/swallowing difficulties: no Changes in vision: no Changes to mood/headaches: no Hair loss/changes to skin/nails: no Difficulty focusing/concentrating: no Sweating: no Limb weakness: no Dizziness/lightheadedness: no Palpitations: no  Carbonated/caffeinated beverages: no N/V/D/C/Gas: no Abdominal pain: yes, stating she has pain on her right side and around to the front Dumping syndrome: no   NUTRITION DIAGNOSIS  Overweight/obesity (Smyth-3.3) related to past poor dietary habits and physical inactivity as evidenced by completed bariatric surgery and following dietary guidelines for continued weight loss and healthy nutrition status.   NUTRITION INTERVENTION Nutrition counseling (C-1) and education (E-2) to facilitate bariatric surgery goals, including: Diet advancement to the standard prep plan The importance of consuming adequate calories as well as certain nutrients daily due to the body's need for essential vitamins, minerals, and fats The importance of daily physical activity and to reach a goal of at least 150 minutes of moderate to vigorous physical activity weekly (or as directed by their physician) due to benefits such as increased musculature and improved lab values The importance of intuitive eating specifically learning hunger-satiety cues and understanding the importance of learning a new body: The importance of mindful eating to avoid grazing behaviors  After bariatric surgery, it's  important to aim for satisfaction rather than fullness when eating. Because the stomach is much smaller, eating until full can lead to discomfort, nausea, or even stretching of the stomach pouch over time. Instead, focusing on eating slowly and mindfully helps you recognize the early signs of satisfaction--when your body has had enough to meet its needs without feeling  overly full. This approach supports portion control, reduces the risk of complications, and encourages a healthier relationship with food. It also helps reinforce long-term habits that align with your weight and wellness goals.  Goals Aim for satisfactions and not fullness Eat 3 meals small a day with snacks in between; eat even if not hungry Start the BELT program  Handouts Provided Include  Meal Ideas Standard Prep Plan advancement Guide Body Comp Scale printout  Learning Style & Readiness for Change Teaching method utilized: Visual & Auditory  Demonstrated degree of understanding via: Teach Back  Readiness Level: contemplation Barriers to learning/adherence to lifestyle change: mobility  RD's Notes for Next Visit Assess adherence to pt chosen goals  MONITORING & EVALUATION Dietary intake, weekly physical activity, body weight.  Next Steps Patient is to follow-up in 3 months for 6 month post-op follow-up.

## 2024-01-30 NOTE — Telephone Encounter (Signed)
 Please advise if refill for Ambien  is okay.  Thank you.

## 2024-01-30 NOTE — Telephone Encounter (Signed)
 Ambien refilled

## 2024-01-30 NOTE — Telephone Encounter (Signed)
 Zolpidem refill

## 2024-01-31 ENCOUNTER — Other Ambulatory Visit: Payer: Self-pay | Admitting: Family Medicine

## 2024-02-05 ENCOUNTER — Encounter (HOSPITAL_COMMUNITY): Payer: Self-pay | Admitting: Surgery

## 2024-02-16 ENCOUNTER — Other Ambulatory Visit (HOSPITAL_COMMUNITY): Payer: Self-pay

## 2024-02-16 NOTE — Progress Notes (Signed)
 Specialty Pharmacy Refill Coordination Note  April [name] is a 53 y.o. female contacted today regarding refills of specialty medication(s) Tezepelumab -ekko (Tezspire )   Patient requested Courier to Provider Office   Delivery date: 02/24/24   Verified address: [facility] [address] [place], [place] [address]   Medication will be filled on 02/23/24.

## 2024-02-22 ENCOUNTER — Other Ambulatory Visit: Payer: Self-pay | Admitting: Family Medicine

## 2024-02-22 DIAGNOSIS — E038 Other specified hypothyroidism: Secondary | ICD-10-CM

## 2024-02-23 ENCOUNTER — Other Ambulatory Visit: Payer: Self-pay

## 2024-02-24 ENCOUNTER — Inpatient Hospital Stay: Admitting: Family Medicine

## 2024-02-24 ENCOUNTER — Telehealth: Payer: Self-pay | Admitting: Family Medicine

## 2024-02-24 NOTE — Telephone Encounter (Unsigned)
 Copied from CRM [id number]. Topic: Appointments - Scheduling Inquiry for Clinic >> Feb 24, 2024  1:27 PM Donee H wrote: Reason for CRM: Patient called to confirm hospital follow up visit for today. Looks like appointment was canceled due to provider not in office. Patient would like to reschedule appointment to be seen as soon as she can. There are no openings until Dec. She prefers to see pcp. Please follow back up with her on request. [phone]

## 2024-02-26 ENCOUNTER — Ambulatory Visit

## 2024-02-26 VITALS — BP 126/85 | HR 73 | Temp 97.8°F | Resp 18 | Ht 65.0 in | Wt 261.8 lb

## 2024-02-26 DIAGNOSIS — J4489 Other specified chronic obstructive pulmonary disease: Secondary | ICD-10-CM

## 2024-02-26 DIAGNOSIS — J4552 Severe persistent asthma with status asthmaticus: Secondary | ICD-10-CM | POA: Diagnosis not present

## 2024-02-26 MED ORDER — TEZEPELUMAB-EKKO 210 MG/1.91ML ~~LOC~~ SOSY
210.0000 mg | PREFILLED_SYRINGE | Freq: Once | SUBCUTANEOUS | Status: AC
  Administered 2024-02-26: 210 mg via SUBCUTANEOUS
  Filled 2024-02-26: qty 1.91

## 2024-02-26 NOTE — Progress Notes (Signed)
 Diagnosis: Asthma  Provider:  Chilton Greathouse MD  Procedure: Injection  Tezspire (Tezepelumab), Dose: 210 mg, Site: subcutaneous, Number of injections: 1  Injection Site(s): Left arm  Post Care:  left arm injection  Discharge: Condition: Good, Destination: Home . AVS Declined  Performed by:  Rico Ala, LPN

## 2024-03-01 ENCOUNTER — Encounter: Payer: Self-pay | Admitting: Radiology

## 2024-03-02 ENCOUNTER — Other Ambulatory Visit: Payer: Self-pay

## 2024-03-02 NOTE — Progress Notes (Signed)
 Specialty Pharmacy Ongoing Clinical Assessment Note  April [name] is a 53 y.o. female who is being followed by the specialty pharmacy service for RxSp Asthma/COPD   Patient's specialty medication(s) reviewed today: Tezepelumab -ekko (Tezspire )   Missed doses in the last 4 weeks: 0   Patient/Caregiver did not have any additional questions or concerns.   Therapeutic benefit summary: Patient is achieving benefit   Adverse events/side effects summary: No adverse events/side effects   Patient's therapy is appropriate to: Continue    Goals Addressed             This Visit's Progress    Reduce disease symptoms including coughing and shortness of breath   On track    Patient is on track. Patient will maintain adherence         Follow up: 12 months  Maeva Dant M Jadakiss Barish Specialty Pharmacist

## 2024-03-10 NOTE — Progress Notes (Deleted)
 Patient ID: April [name], female    DOB: 02-Apr-1971, 53 y.o.   MRN: [id number]   HPI F former smoker followed for chronic severe asthma/ Xolair  complicated by non-compliance,  OSA/ CPAP,  morbid obesity, HBP, depression, GERD, AFib/ Eliquis   NPSG 03/31/07- AHI 5.8/ hr, weight 330 lbs. She canceled planned more recent study. Office spirometry 10/24/14- severe obstructive airways disease. FVC 2.10/66%, FEV1 1.25/47%, FEV1/FVC 0.59, FEF 25-75 percent 0.64/19% Unattended HST 07/06/16- AHI 11/hour, desaturation to 74%. Office Spirometry 07/29/2016-moderately severe obstructive airways disease. FVC 2.21/68%, FEV1 1.53/57%, ratio 0.69, FEF 25-75% 0.98/34% Prolonged hosp [facility] 4/19- tracheostomy. Dupixent  enrolled 10/22/2018> Tezspire  2023 HST 12/26/2018- AHI 9.7/ hr, desaturation to 9.7/ hr, desaturation to 69% NPSG 05/19/22- AHI 0/ hr, desat to 95% Primary Snoring, body weight 315 lbs -------------------------------------------------------   09/08/23- 53 year old female Smoker followed for chronic severe Asthma/ COPD/Xolair , Chronic Hypoxic Respiratory Failure, Tracheostomy at [facility] 2019, Insomnia,  complicated by noncompliance, OSA/ oral appliance, morbid Obesity, HTN, depression, GERD Covid infection, PAFib/ Eliquis , dCHF, Dyslipidemia, Depression,  Dupixent  enrolled 10/22/2018 changed to Tezspire  2023. O2 2L prn -Proair  hfa, Trelegy  200, Singulair , Neb Brovana , Tezspire , Ambien  10, Vyvanse , Neb Brovana ?, Phentermine ,  Body weight today- 309 lbs -----Not sleeping well.  Would like to go back on Klonopin .  Nasal congestion making it harder to sleep.  Would like to use the previous nasal spray (does not remember the name).  Getting ready for her bariatric surgery - weight is fluctuating. Continues with nutrition program. Anticipates her bariatric surgery will be late summer. Sheldon definitely helps with asthma control.  Discussed the use of AI scribe software for clinical note transcription with the  patient, who gave verbal consent to proceed.  History of Present Illness   April [name] is a 53 year old female who presents for follow-up regarding her upcoming bariatric surgery and related concerns.  She experiences nasal congestion, particularly in the mornings, and has previously used Flonase  nasal spray with good effect. She has run out of the spray and seeks assistance in obtaining more.  She is currently on Tezspire  for asthma management and finds it helpful. She reports good breathing overall and has not been hospitalized recently for asthma-related issues.   Zolpidem  has not helped with chronic insomnia and she asks to go back to clonazepam , which worked much better.     Assessment and Plan:  Addendum for surgical clearance 09/18/23:  She is clear from pulmonary standpoint for planned surgery, which should be done in hospital with Pulmonary consultation available if needed. She is at higher than average risk due to her multiple medical problems.    Asthma moderate persistent uncomplicated -well controlled on current regimen  Allergic rhinitis Nasal congestion consistent with allergic rhinitis. Flonase  previously effective. - Prescribe Flonase  nasal spray.  Insomnia Zopium ineffective. Clonazepam  considered as alternative for improved sleep. - Prescribe clonazepam  to be taken 20 minutes before bedtime.  Obesity Preparing for bariatric surgery. Previous weight loss medications ineffective. Committed to process and aware of post-surgery challenges. - Schedule nutritionist appointment. - Await insurance approval.    03/11/24- 53 year old female Smoker followed for chronic severe Asthma/ COPD/Xolair , Chronic Hypoxic Respiratory Failure, Tracheostomy at [facility] 2019, Insomnia,  complicated by noncompliance, OSA/ oral appliance, morbid Obesity, Gastric Bypass 10/2023,  HTN, depression, GERD Covid infection, PAFib/ Eliquis , dCHF, Dyslipidemia, Depression,  Dupixent  enrolled 10/22/2018  changed to Tezspire  2023. O2 2L prn -Proair  hfa, Trelegy  200, Singulair , Neb Brovana , Tezspire , Ambien  10, Vyvanse , Neb Brovana ?, Phentermine ,  Body weight today-         (Had gastric bypass sgy in July)   CXR 09/11/23 IMPRESSION: No acute cardiopulmonary findings.    Review of Systems-See HPI  + = positive Constitutional:   + weight loss, night sweats, fevers, chills, +fatigue, lassitude. HEENT:   No-  headaches, difficulty swallowing, tooth/dental problems, sore throat,    sneezing, itching, ear ache, +nasal congestion, post nasal drip,  CV:  No-   chest pain, orthopnea, PND, swelling in lower extremities, anasarca, dizziness, palpitations Resp: +shortness of breath with exertion or at rest.              productive cough,  + non-productive cough,  No- coughing up of blood.              No-   change in color of mucus. +wheezing.   Skin: No-   rash or lesions. GI:  No-   heartburn, indigestion, abdominal pain, nausea, vomiting,  GU: MS:  No-   joint pain or swelling.   Neuro-     nothing unusual Psych:  No- change in mood or affect. No depression or anxiety.  No memory loss.    Objective:   Physical Exam    General- Alert, Oriented, Affect-appropriate, Distress- none acute, + morbid  obesity,  Skin- rash-none, lesions- none, excoriation- none Lymphadenopathy- none Head- atraumatic            Eyes- Gross vision intact, PERRLA, conjunctivae clear secretions            Ears- Hearing, canals-normal            Nose- Clear, no-Septal dev, mucus, polyps, erosion, perforation.            Throat- Mallampati III , mucosa clear , drainage- none, tonsils- atrophic,                           + Missing  teeth,                 Neck- flexible , trachea+ stoma scar, no stridor , thyroid nl, carotid no bruit Chest - symmetrical excursion , unlabored           Heart/CV- RRR , no murmur , no gallop  , no rub, nl s1 s2                           - JVD- none , edema- none, stasis changes- none,  varices- none           Lung- +Diminished, . Wheeze-none, cough-none,  dullness-none,  rub- none            Chest wall Abd-  Br/ Gen/ Rectal- Not done, not indicated Extrem- cyanosis- none, clubbing, none, atrophy- none, strength- nl Neuro- grossly intact to observation

## 2024-03-11 ENCOUNTER — Ambulatory Visit: Admitting: Internal Medicine

## 2024-03-11 ENCOUNTER — Encounter: Payer: Self-pay | Admitting: Internal Medicine

## 2024-03-12 ENCOUNTER — Ambulatory Visit: Payer: Self-pay

## 2024-03-12 NOTE — Telephone Encounter (Signed)
 LVM informing patient that PCP does not have any opening prior to Wednesday. I also informed her that any opening come available on Monday I will reach out to her.

## 2024-03-12 NOTE — Telephone Encounter (Signed)
 FYI Only or Action Required?: FYI only for provider: ED advised.  Patient was last seen in primary care on 12/15/2023 by Newlin, Enobong, MD.  Called Nurse Triage reporting Dizziness.  Symptoms began a week ago.  Interventions attempted: Rest, hydration, or home remedies.  Symptoms are: gradually worsening.  Triage Disposition: Go to ED Now (or PCP Triage)  Patient/caregiver understands and will follow disposition?: Yes   Copied from CRM #[id number]. Topic: Clinical - Red Word Triage >> Mar 12, 2024 10:03 AM Tinnie BROCKS wrote: Red Word that prompted transfer to Nurse Triage: Suddenly off balance, falling a lot in the last 2 weeks. Has appt on Wednesday, but wants to know if it can be r/s to later time same day or sooner. Reason for Disposition  [1] Dizziness (vertigo) present now AND [2] one or more STROKE RISK FACTORS (i.e., hypertension, diabetes, prior stroke/TIA, heart attack)  (Exception: Prior doctor or NP/PA evaluation for this AND no different/worse than usual.)  Answer Assessment - Initial Assessment Questions Additional info:  Patient called to see if she could move her follow up appointment scheduled next week to sooner date due to two weeks of balance issues with multiple falls, new worsening blurry vision, and headache. ED advised, patient reluctant but agreeable.    1. DESCRIPTION: Describe your dizziness.     Off balance 2. VERTIGO: Do you feel like either you or the room is spinning or tilting?      Off balance  3. LIGHTHEADED: Do you feel lightheaded? (e.g., somewhat faint, woozy, weak upon standing)      Slight weakness  4. SEVERITY: How bad is it?  Can you walk?     Fall X 3 in two weeks from loss of balance  5. ONSET:  When did the dizziness begin?     2 weeks  6. AGGRAVATING FACTORS: Does anything make it worse? (e.g., standing, change in head position)     Being Upright  7. CAUSE: What do you think is causing the dizziness?     unsure 8.  RECURRENT SYMPTOM: Have you had dizziness before? If Yes, ask: When was the last time? What happened that time?     no 9. OTHER SYMPTOMS: Do you have any other symptoms? (e.g., earache, headache, numbness, tinnitus, vomiting, weakness)     Fall X 3 in two weeks, intermittent headaches, blurry vision suddenly worsening.  Protocols used: Dizziness - Vertigo-A-AH

## 2024-03-15 ENCOUNTER — Other Ambulatory Visit: Payer: Self-pay

## 2024-03-15 NOTE — Progress Notes (Signed)
 Specialty Pharmacy Refill Coordination Note  April [name] is a 53 y.o. female assessed today regarding refills of clinic administered specialty medication(s) Tezepelumab -ekko (Tezspire )   Clinic requested Courier to Provider Office   Delivery date: 03/30/24   Verified address: [facility] [address] [place], [place] [address]   Medication will be filled on: 03/29/24

## 2024-03-17 ENCOUNTER — Ambulatory Visit: Attending: Family Medicine | Admitting: Family Medicine

## 2024-03-17 ENCOUNTER — Encounter: Payer: Self-pay | Admitting: Family Medicine

## 2024-03-17 VITALS — BP 138/85 | HR 71 | Temp 98.7°F | Ht 65.0 in | Wt 253.2 lb

## 2024-03-17 DIAGNOSIS — E1159 Type 2 diabetes mellitus with other circulatory complications: Secondary | ICD-10-CM | POA: Diagnosis not present

## 2024-03-17 DIAGNOSIS — M79602 Pain in left arm: Secondary | ICD-10-CM | POA: Diagnosis not present

## 2024-03-17 DIAGNOSIS — J4541 Moderate persistent asthma with (acute) exacerbation: Secondary | ICD-10-CM

## 2024-03-17 DIAGNOSIS — G894 Chronic pain syndrome: Secondary | ICD-10-CM

## 2024-03-17 DIAGNOSIS — E876 Hypokalemia: Secondary | ICD-10-CM

## 2024-03-17 DIAGNOSIS — K219 Gastro-esophageal reflux disease without esophagitis: Secondary | ICD-10-CM

## 2024-03-17 DIAGNOSIS — Z23 Encounter for immunization: Secondary | ICD-10-CM

## 2024-03-17 DIAGNOSIS — I480 Paroxysmal atrial fibrillation: Secondary | ICD-10-CM

## 2024-03-17 DIAGNOSIS — E1169 Type 2 diabetes mellitus with other specified complication: Secondary | ICD-10-CM | POA: Diagnosis not present

## 2024-03-17 DIAGNOSIS — E038 Other specified hypothyroidism: Secondary | ICD-10-CM

## 2024-03-17 DIAGNOSIS — I152 Hypertension secondary to endocrine disorders: Secondary | ICD-10-CM

## 2024-03-17 DIAGNOSIS — E559 Vitamin D deficiency, unspecified: Secondary | ICD-10-CM

## 2024-03-17 MED ORDER — RYBELSUS 3 MG PO TABS
3.0000 mg | ORAL_TABLET | Freq: Every day | ORAL | 1 refills | Status: AC
Start: 2024-03-17 — End: ?

## 2024-03-17 MED ORDER — TIZANIDINE HCL 4 MG PO TABS: 4.0000 mg | ORAL_TABLET | Freq: Three times a day (TID) | ORAL | 1 refills | Status: AC | PRN

## 2024-03-17 MED ORDER — CARVEDILOL 6.25 MG PO TABS: 6.2500 mg | ORAL_TABLET | Freq: Two times a day (BID) | ORAL | 1 refills | Status: AC

## 2024-03-17 MED ORDER — LIDOCAINE 5 % EX PTCH: 1.0000 | MEDICATED_PATCH | CUTANEOUS | 1 refills | Status: DC

## 2024-03-17 MED ORDER — CLONIDINE HCL 0.2 MG PO TABS: 0.2000 mg | ORAL_TABLET | Freq: Two times a day (BID) | ORAL | 0 refills | Status: AC

## 2024-03-17 MED ORDER — OMEPRAZOLE 20 MG PO CPDR: 40.0000 mg | DELAYED_RELEASE_CAPSULE | Freq: Every day | ORAL | 1 refills | Status: AC

## 2024-03-17 MED ORDER — POTASSIUM CHLORIDE ER 20 MEQ PO TBCR: 20.0000 meq | EXTENDED_RELEASE_TABLET | Freq: Two times a day (BID) | ORAL | 1 refills | Status: AC

## 2024-03-17 NOTE — Progress Notes (Signed)
 Subjective:  Patient ID: April [name], female    DOB: 1970-06-21  Age: 53 y.o. MRN: [id number]  CC: Medical Management of Chronic Issues (Discuss medications/Left arm pain)     Discussed the use of AI scribe software for clinical note transcription with the patient, who gave verbal consent to proceed.  History of Present Illness April [name] is a 53 year old female with a history of morbid obesity status post Roux-en-Y gastric bypass on 10/2023, major depressive disorder, hypertension, COPD/ Asthma, paroxysmal atrial fibrillation obstructive sleep apnea (not compliant with CPAP) with multiple hospitalizations for asthma exacerbation/COPD , steroid-induced diabetes who presents with medication management and left arm pain.  She experiences multiple falls due to balance issues, even while using a cane. She is in a balance program following bariatric surgery. Concerns about medication management include discontinued vitamin D  and potassium supplementation post-surgery. She takes over-the-counter potassium and is unsure about restarting Rybelsus . Her last A1c was 5.9. She experiences cravings, which have not improved with nutritional interventions, and burning sensations in her feet.  Left arm pain began in August, localized to the upper arm and shoulder. The pain worsens when lying on it and persists when lying on her back. It is described as 'achy' and severe. Patches provide no relief. There is no numbness or tingling.  There is no history of trauma.  Current medications include Eliquis , atorvastatin , carvedilol , pregabalin , levothyroxine , omeprazole , clonidine , and over-the-counter potassium supplements. Her asthma is stable and she is under the care of pulmonary with an appointment coming up tomorrow. She also continues to see pain management.   Past Medical History:  Diagnosis Date   Acanthosis nigricans    Anxiety    ARDS (adult respiratory distress syndrome) (HCC) 2019   Respiratory  Failure, had Trache / ECMO at [facility]   Arthritis    knees (04/28/2017)   Asthma    Followed by Dr. Neysa (pulmonology); receives every other week omalizumab  injections; has frequent exacerbations   Back pain    Chronic diastolic CHF (congestive heart failure) (HCC) 01/17/2017   COPD (chronic obstructive pulmonary disease) (HCC)    PFTs in 2002, FEV1/FVC 65, no post bronchodilater test done   Depression    Dysrhythmia    A. Fib   GERD (gastroesophageal reflux disease)    Headache(784.0)    q couple days (04/28/2017)   Helicobacter pylori (H. pylori) infection    Hypertension, essential    Hypothyroidism    Insomnia    Joint pain    Lower extremity edema    Menorrhagia    Morbid obesity (HCC)    OSA on CPAP    Sleep study 2008 - mild OSA, not enough events to titrate CPAP; wears CPAP now/pt on 04/28/2017   Pneumonia X 1   Pre-diabetes    Rheumatoid arthritis (HCC)    Seasonal allergies    Shortness of breath    Tobacco user    Vitamin D  deficiency     Past Surgical History:  Procedure Laterality Date   CARDIOVERSION N/A 05/30/2017   Procedure: CARDIOVERSION;  Surgeon: Francyne Headland, MD;  Location: [facility];  Service: Cardiovascular;  Laterality: N/A;   COLONOSCOPY WITH ESOPHAGOGASTRODUODENOSCOPY (EGD)     for Pre- Bariatric surgery evaluation   EXTRACORPOREAL CIRCULATION  2019   For ARDS, Respiratory Failure   done at [facility]   GASTRIC ROUX-EN-Y N/A 11/17/2023   Procedure: LAPAROSCOPIC ROUX-EN-Y GASTRIC;  Surgeon: Signe Mitzie LABOR, MD;  Location: [facility];  Service: General;  Laterality: N/A;  LAPAROSCOPIC GASTRIC BYPASS   REDUCTION MAMMAPLASTY Bilateral 09/2011   TUBAL LIGATION  1996   bilateral   UPPER GI ENDOSCOPY N/A 11/17/2023   Procedure: ENDOSCOPY, UPPER GI TRACT;  Surgeon: Signe Mitzie LABOR, MD;  Location: [facility];  Service: General;  Laterality: N/A;    Family History  Problem Relation Age of Onset   Hypertension Mother    Asthma Daughter    Cancer  Paternal Aunt    Asthma Maternal Grandmother     Social History   Socioeconomic History   Marital status: Single    Spouse name: Not on file   Number of children: 1   Years of education: Not on file   Highest education level: Not on file  Occupational History   Occupation: stay at home  Tobacco Use   Smoking status: Former    Current packs/day: 0.00    Average packs/day: 0.5 packs/day for 26.0 years (13.0 ttl pk-yrs)    Types: Cigarettes    Start date: 09/11/1988    Quit date: 09/12/2014    Years since quitting: 9.5   Smokeless tobacco: Never   Tobacco comments:    Has vaped before, caught in the hospital  Vaping Use   Vaping status: Never Used  Substance and Sexual Activity   Alcohol use: No   Drug use: No   Sexual activity: Not Currently  Other Topics Concern   Not on file  Social History Narrative   Daughters are grown, not married, works as a LAWYER.   Social Drivers of Health   Financial Resource Strain: Medium Risk (11/20/2022)   Overall Financial Resource Strain (CARDIA)    Difficulty of Paying Living Expenses: Somewhat hard  Food Insecurity: No Food Insecurity (11/26/2023)   Hunger Vital Sign    Worried About Running Out of Food in the Last Year: Never true    Ran Out of Food in the Last Year: Never true  Transportation Needs: No Transportation Needs (11/26/2023)   PRAPARE - Administrator, Civil Service (Medical): No    Lack of Transportation (Non-Medical): No  Physical Activity: Insufficiently Active (11/20/2022)   Exercise Vital Sign    Days of Exercise per Week: 3 days    Minutes of Exercise per Session: 30 min  Stress: Stress Concern Present (11/20/2022)   [employer] of Occupational Health - Occupational Stress Questionnaire    Feeling of Stress : To some extent  Social Connections: Socially Isolated (11/20/2022)   Social Connection and Isolation Panel    Frequency of Communication with Friends and Family: More than three times a week     Frequency of Social Gatherings with Friends and Family: Three times a week    Attends Religious Services: Never    Active Member of Clubs or Organizations: No    Attends Banker Meetings: Never    Marital Status: Never married    Allergies  Allergen Reactions   Other Shortness Of Breath and Other (See Comments)    Seasonal allergies- Wheezing   Contrast Media [Iodinated Contrast Media] Itching and Other (See Comments)    CT contrast    Outpatient Medications Prior to Visit  Medication Sig Dispense Refill   albuterol  (VENTOLIN  HFA) 108 (90 Base) MCG/ACT inhaler INHALE 2 PUFFS INTO THE LUNGS EVERY 6 HOURS AS NEEDED FOR WHEEZING OR SHORTNESS OF BREATH 8.5 g 3   apixaban  (ELIQUIS ) 5 MG TABS tablet Take 1 tablet (5 mg total) by mouth 2 (two) times daily.  60 tablet 1   atorvastatin  (LIPITOR) 20 MG tablet TAKE 1 TABLET(20 MG) BY MOUTH DAILY 90 tablet 1   clonazePAM  (KLONOPIN ) 0.5 MG tablet 1 or 2 tabs at bedtime as needed for sleep (Patient taking differently: Take 0.5 mg by mouth in the morning and at bedtime.) 60 tablet 5   fluticasone  (FLONASE ) 50 MCG/ACT nasal spray 1-2 puffs each nostril at bedtime while needed (Patient taking differently: Place 1 spray into both nostrils 2 (two) times daily as needed for allergies or rhinitis.) 16 g 5   Fluticasone -Umeclidin-Vilant (TRELEGY ELLIPTA ) 200-62.5-25 MCG/ACT AEPB Inhale 1 puff into the lungs daily. 60 each 11   ipratropium-albuterol  (DUONEB) 0.5-2.5 (3) MG/3ML SOLN Take 3 mLs by nebulization every 4 (four) hours as needed (shortness of breath or wheezing). (Patient taking differently: Take 3 mLs by nebulization 3 (three) times daily as needed (shortness of breath or wheezing).) 360 mL 0   levothyroxine  (SYNTHROID ) 50 MCG tablet TAKE 1 TABLET(50 MCG) BY MOUTH DAILY BEFORE BREAKFAST 90 tablet 0   montelukast  (SINGULAIR ) 10 MG tablet Take 1 tablet (10 mg total) by mouth at bedtime. 30 tablet 11   Multiple Vitamins-Minerals (BARIATRIC  MULTIVITAMINS) CHEW Chew 1 tablet by mouth daily.     naloxone  (NARCAN ) nasal spray 4 mg/0.1 mL Place 1 spray into the nose once as needed (opioid overdose).     ondansetron  (ZOFRAN -ODT) 4 MG disintegrating tablet Take 1 tablet (4 mg total) by mouth every 6 (six) hours as needed for nausea or vomiting. (Patient taking differently: Take 4 mg by mouth every 6 (six) hours as needed for nausea or vomiting (dissolve orally).) 20 tablet 0   ondansetron  (ZOFRAN -ODT) 8 MG disintegrating tablet Take 1 tablet (8 mg total) by mouth every 8 (eight) hours as needed for nausea or vomiting. 20 tablet 0   oxyCODONE -acetaminophen  (PERCOCET) 10-325 MG tablet Take 1 tablet by mouth See admin instructions. Take 1 tablet by mouth every 4-6 hours as needed for pain     pregabalin  (LYRICA ) 75 MG capsule Take 75 mg by mouth 3 (three) times daily.     PROTEIN PO Take 30 mLs by mouth in the morning.     solifenacin  (VESICARE ) 5 MG tablet TAKE 1 TABLET(5 MG) BY MOUTH DAILY 90 tablet 1   Tezepelumab -ekko (TEZSPIRE ) 210 MG/1. SOAJ Inject 210 mg into the skin every 28 (twenty-eight) days. 1.91 mL 5   UNKNOWN TO PATIENT Take 1 tablet by mouth See admin instructions. Unnamed OTC bariatric vitamin - Chew 1 tablet by mouth three times a day     VEOZAH  45 MG TABS TAKE 1 TABLET(45 MG) BY MOUTH DAILY 90 tablet 1   zolpidem  (AMBIEN ) 10 MG tablet TAKE 1 TABLET BY MOUTH AT BEDTIME AS NEEDED FOR SLEEP 30 tablet 5   carvedilol  (COREG ) 6.25 MG tablet Take 1 tablet (6.25 mg total) by mouth 2 (two) times daily with a meal. (Patient taking differently: Take 6.25 mg by mouth daily.) 180 tablet 1   cloNIDine  (CATAPRES ) 0.2 MG tablet TAKE 1 TABLET BY MOUTH TWICE DAILY 180 tablet 0   methocarbamol  (ROBAXIN ) 500 MG tablet Take 500 mg by mouth every 6 (six) hours as needed for muscle spasms.     pantoprazole  (PROTONIX ) 40 MG tablet Take 1 tablet (40 mg total) by mouth daily. Take this medication daily, regardless of reflux symptoms (Patient taking  differently: Take 40 mg by mouth daily. Take 40 mg by mouth once a day, regardless of reflux symptoms) 90 tablet 0  Vitamin D , Ergocalciferol , (DRISDOL ) 1.25 MG (50000 UNIT) CAPS capsule Take 1 capsule (50,000 Units total) by mouth every 7 (seven) days. (Patient not taking: Reported on 03/17/2024) 12 capsule 1   gabapentin  (NEURONTIN ) 100 MG capsule Take 1 capsule (100 mg total) by mouth every 12 (twelve) hours for 5 days. (Patient not taking: Reported on 03/17/2024) 10 capsule 0   Facility-Administered Medications Prior to Visit  Medication Dose Route Frequency Provider Last Rate Last Admin   tezepelumab -ekko (TEZSPIRE ) 210 MG/1. syringe 210 mg  210 mg Subcutaneous Once Neysa Rama D, MD       tezepelumab -ekko (TEZSPIRE ) 210 MG/1. syringe 210 mg  210 mg Subcutaneous Once Neysa Rama D, MD         ROS Review of Systems  Constitutional:  Negative for activity change and appetite change.  HENT:  Negative for sinus pressure and sore throat.   Respiratory:  Negative for chest tightness, shortness of breath and wheezing.   Cardiovascular:  Negative for chest pain and palpitations.  Gastrointestinal:  Negative for abdominal distention, abdominal pain and constipation.  Genitourinary: Negative.   Musculoskeletal:        See HPI  Psychiatric/Behavioral:  Negative for behavioral problems and dysphoric mood.     Objective:  BP 138/85   Pulse 71   Temp 98.7 F (37.1 C) (Oral)   Ht 5' 5 (1.651 m)   Wt 253 lb 3.2 oz (114.9 kg)   SpO2 98%   BMI 42.13 kg/m      03/17/2024    4:07 PM 02/26/2024    3:55 PM 01/29/2024    3:16 PM  BP/Weight  Systolic BP 138 126 130  Diastolic BP 85 85 83  Wt. (Lbs) 253.2 261.8 268.4  BMI 42.13 kg/m2 43.57 kg/m2 44.66 kg/m2      Physical Exam Constitutional:      Appearance: She is well-developed. She is obese.  Cardiovascular:     Rate and Rhythm: Normal rate.     Heart sounds: Normal heart sounds. No murmur heard. Pulmonary:      Effort: Pulmonary effort is normal.     Breath sounds: Normal breath sounds. No wheezing or rales.  Chest:     Chest wall: No tenderness.  Abdominal:     General: Bowel sounds are normal. There is no distension.     Palpations: Abdomen is soft. There is no mass.     Tenderness: There is no abdominal tenderness.  Musculoskeletal:        General: Normal range of motion.     Right lower leg: No edema.     Left lower leg: No edema.     Comments: Genu valgum bilaterally Tenderness on palpation of left bicep muscle.  On extreme abduction of left upper extremity there is associated tenderness in the bicep muscle. Normal size of left biceps. Right bicep is normal Normal handgrip bilaterally. Negative Hawkin and Neer signs  Neurological:     Mental Status: She is alert and oriented to person, place, and time.  Psychiatric:        Mood and Affect: Mood normal.        Latest Ref Rng & Units 01/23/2024    5:52 PM 11/27/2023    5:08 AM 11/26/2023   12:41 PM  CMP  Glucose 70 - 99 mg/dL 892  68  68   BUN 6 - 20 mg/dL 16  11  15    Creatinine 0.44 - 1.00 mg/dL 9.19  9.38  9.21  Sodium 135 - 145 mmol/L 137  138  137   Potassium 3.5 - 5.1 mmol/L 3.5  3.4  3.6   Chloride 98 - 111 mmol/L 103  106  103   CO2 22 - 32 mmol/L 22  17  18    Calcium  8.9 - 10.3 mg/dL 9.4  8.3  9.1   Total Protein 6.5 - 8.1 g/dL 7.4  6.6  7.2   Total Bilirubin 0.0 - 1.2 mg/dL 0.2  0.7  0.8   Alkaline Phos 38 - 126 U/L 77  54  62   AST 15 - 41 U/L 24  14  16    ALT 0 - 44 U/L 18  15  15      Lipid Panel     Component Value Date/Time   CHOL 192 10/16/2021 0506   CHOL 238 (H) 03/30/2020 1128   TRIG 51 10/16/2021 0506   HDL 99 10/16/2021 0506   HDL 45 03/30/2020 1128   CHOLHDL 1.9 10/16/2021 0506   VLDL 10 10/16/2021 0506   LDLCALC 83 10/16/2021 0506   LDLCALC 167 (H) 03/30/2020 1128    CBC    Component Value Date/Time   WBC 10.4 01/23/2024 1640   RBC 5.18 (H) 01/23/2024 1640   HGB 13.9 01/23/2024 1640    HGB 10.4 (L) 01/22/2018 1403   HCT 44.1 01/23/2024 1640   HCT 33.3 (L) 01/22/2018 1403   PLT 397 01/23/2024 1640   PLT 616 (H) 01/22/2018 1403   MCV 85.1 01/23/2024 1640   MCV 82 01/22/2018 1403   MCH 26.8 01/23/2024 1640   MCHC 31.5 01/23/2024 1640   RDW 15.8 (H) 01/23/2024 1640   RDW 17.1 (H) 01/22/2018 1403   LYMPHSABS 3.4 11/14/2023 1146   LYMPHSABS 3.3 (H) 01/22/2018 1403   MONOABS 1.1 (H) 11/14/2023 1146   EOSABS 0.3 11/14/2023 1146   EOSABS 0.3 01/22/2018 1403   BASOSABS 0.1 11/14/2023 1146   BASOSABS 0.1 01/22/2018 1403    Lab Results  Component Value Date   HGBA1C 5.9 12/15/2023    Lab Results  Component Value Date   TSH 4.020 05/20/2023       Assessment & Plan Morbid obesity status post Roux-en-Y gastric bypass Post-surgical weight management ongoing. Weight loss expected to improve health, including knee pain and balance. - Continue belt program for balance improvement. - Encouraged weight loss for knee pain and balance.  Type 2 diabetes mellitus Diabetes management under review post-surgery. Last A1c 5.9. Rybelsus  discontinued post-surgery, reassessment needed. Adverse effects with Ozempic  noted. - Restarted Rybelsus  3 mg - Checked A1c level.  Hypertension associated with type 2 diabetes Blood pressure management ongoing with carvedilol  and clonidine . - Continue carvedilol  and clonidine . -Counseled on blood pressure goal of less than 130/80, low-sodium, DASH diet, medication compliance, 150 minutes of moderate intensity exercise per week. Discussed medication compliance, adverse effects.   Atrial fibrillation Currently in sinus rhythm Managed with apixaban . - Continue apixaban .  Asthma Stable with no flares - Continue current asthma regimen.  Chronic pain syndrome Managed with pregabalin . Ongoing pain reported, under pain management care. - Continue pregabalin .  Left upper arm pain Severe, affects daily activities. No numbness or tingling.  Exacerbated by movement. - Ordered aldolase, will add on CK - Prescribed tizanidine  as needed. - Prescribed lidocaine  patches. - Consider referral to orthopedics if persistent.   Hypothyroidism Managed with levothyroxine . Last TSH checked in January. - Checked TSH level. - Adjusted levothyroxine  if necessary.  Vitamin D  deficiency, status post supplementation Recent levels  unknown. - Checked vitamin D  level.  Hypokalemia, status post supplementation Potassium supplementation discontinued post-surgery. Monitoring needed to avoid hyperkalemia. - Checked potassium level.  Gastroesophageal reflux disease (GERD) Managed with pantoprazole . Recent heartburn reported. - Continue pantoprazole .  General Health Maintenance Due for flu shot.     Healthcare maintenance Need for immunization against influenza-flu shot administered  Meds ordered this encounter  Medications   Semaglutide  (RYBELSUS ) 3 MG TABS    Sig: Take 1 tablet (3 mg total) by mouth daily.    Dispense:  90 tablet    Refill:  1   carvedilol  (COREG ) 6.25 MG tablet    Sig: Take 1 tablet (6.25 mg total) by mouth 2 (two) times daily with a meal.    Dispense:  180 tablet    Refill:  1   cloNIDine  (CATAPRES ) 0.2 MG tablet    Sig: Take 1 tablet (0.2 mg total) by mouth 2 (two) times daily.    Dispense:  180 tablet    Refill:  0   potassium chloride  20 MEQ TBCR    Sig: Take 1 tablet (20 mEq total) by mouth in the morning and at bedtime.    Dispense:  90 tablet    Refill:  1   omeprazole  (PRILOSEC ) 20 MG capsule    Sig: Take 2 capsules (40 mg total) by mouth daily.    Dispense:  90 capsule    Refill:  1   lidocaine  (LIDODERM ) 5 %    Sig: Place 1 patch onto the skin daily. Remove & Discard patch within 12 hours or as directed by MD    Dispense:  30 patch    Refill:  1   tiZANidine  (ZANAFLEX ) 4 MG tablet    Sig: Take 1 tablet (4 mg total) by mouth every 8 (eight) hours as needed.    Dispense:  60 tablet    Refill:   1    Follow-up: Return in about 6 months (around 09/14/2024) for Chronic medical conditions.       Corrina Sabin, MD, FAAFP. [facility] [place], [place] [phone]   03/17/2024, 5:15 PM

## 2024-03-17 NOTE — Progress Notes (Signed)
 Patient ID: April [name], female    DOB: 1970-05-23, 53 y.o.   MRN: [id number]   HPI F former smoker followed for chronic severe asthma/ Xolair  complicated by non-compliance,  OSA/ CPAP,  morbid obesity/ Bariatric Surgery, HBP, depression, GERD, AFib/ Eliquis   NPSG 03/31/07- AHI 5.8/ hr, weight 330 lbs. She canceled planned more recent study. Office spirometry 10/24/14- severe obstructive airways disease. FVC 2.10/66%, FEV1 1.25/47%, FEV1/FVC 0.59, FEF 25-75 percent 0.64/19% Unattended HST 07/06/16- AHI 11/hour, desaturation to 74%. Office Spirometry 07/29/2016-moderately severe obstructive airways disease. FVC 2.21/68%, FEV1 1.53/57%, ratio 0.69, FEF 25-75% 0.98/34% Prolonged hosp [facility] 4/19- tracheostomy. Dupixent  enrolled 10/22/2018> Tezspire  2023 HST 12/26/2018- AHI 9.7/ hr, desaturation to 9.7/ hr, desaturation to 69% NPSG 05/19/22- AHI 0/ hr, desat to 95% Primary Snoring, body weight 315 lbs Bariatric Roux-en-Y 2025 -------------------------------------------------------   09/08/23- 53 year old female Smoker followed for chronic severe Asthma/ COPD/Xolair , Chronic Hypoxic Respiratory Failure, Tracheostomy at [facility] 2019, Insomnia,  complicated by noncompliance, OSA/ oral appliance, morbid Obesity, HTN, depression, GERD Covid infection, PAFib/ Eliquis , dCHF, Dyslipidemia, Depression,  Dupixent  enrolled 10/22/2018 changed to Tezspire  2023. O2 2L prn -Proair  hfa, Trelegy  200, Singulair , Neb Brovana , Tezspire , Ambien  10, Vyvanse , Neb Brovana ?, Phentermine ,  Body weight today- 309 lbs -----Not sleeping well.  Would like to go back on Klonopin .  Nasal congestion making it harder to sleep.  Would like to use the previous nasal spray (does not remember the name).  Getting ready for her bariatric surgery - weight is fluctuating. Continues with nutrition program. Anticipates her bariatric surgery will be late summer. Sheldon definitely helps with asthma control.  Discussed the use of AI scribe  software for clinical note transcription with the patient, who gave verbal consent to proceed.  History of Present Illness   April [name] is a 53 year old female who presents for follow-up regarding her upcoming bariatric surgery and related concerns.  She experiences nasal congestion, particularly in the mornings, and has previously used Flonase  nasal spray with good effect. She has run out of the spray and seeks assistance in obtaining more.  She is currently on Tezspire  for asthma management and finds it helpful. She reports good breathing overall and has not been hospitalized recently for asthma-related issues.   Zolpidem  has not helped with chronic insomnia and she asks to go back to clonazepam , which worked much better.     Assessment and Plan:  Addendum for surgical clearance 09/18/23:  She is clear from pulmonary standpoint for planned surgery, which should be done in hospital with Pulmonary consultation available if needed. She is at higher than average risk due to her multiple medical problems.    Asthma moderate persistent uncomplicated -well controlled on current regimen  Allergic rhinitis Nasal congestion consistent with allergic rhinitis. Flonase  previously effective. - Prescribe Flonase  nasal spray.  Insomnia Zopium ineffective. Clonazepam  considered as alternative for improved sleep. - Prescribe clonazepam  to be taken 20 minutes before bedtime.  Obesity Preparing for bariatric surgery. Previous weight loss medications ineffective. Committed to process and aware of post-surgery challenges. - Schedule nutritionist appointment. - Await insurance approval.    03/18/24- 53 year old female Smoker followed for chronic severe Asthma/ COPD/Tezspire , Chronic Hypoxic Respiratory Failure, Tracheostomy at [facility] 2019, Insomnia,  complicated by noncompliance, OSA/ oral appliance, morbid Obesity, HTN, depression, GERD Covid infection, PAFib/ Eliquis , dCHF, Dyslipidemia, Depression,   Dupixent  enrolled 10/22/2018 changed to Tezspire  2023. O2 2L prn -Proair  hfa, Trelegy  200, Singulair , Neb Brovana , Tezspire , Ambien  10, Vyvanse ,  Body weight  today-  -----Breathing improved since gastric bypass surgery 11/18/2023. CXR 09/11/23 IMPRESSION: No acute cardiopulmonary findings.     Review of Systems-See HPI  + = positive Constitutional:   + weight loss, night sweats, fevers, chills, +fatigue, lassitude. HEENT:   No-  headaches, difficulty swallowing, tooth/dental problems, sore throat,    sneezing, itching, ear ache, +nasal congestion, post nasal drip,  CV:  No-   chest pain, orthopnea, PND, swelling in lower extremities, anasarca, dizziness, palpitations Resp: +shortness of breath with exertion or at rest.              productive cough,  + non-productive cough,  No- coughing up of blood.              No-   change in color of mucus. +wheezing.   Skin: No-   rash or lesions. GI:  No-   heartburn, indigestion, abdominal pain, nausea, vomiting,  GU: MS:  No-   joint pain or swelling.   Neuro-     nothing unusual Psych:  No- change in mood or affect. No depression or anxiety.  No memory loss.    Objective:   Physical Exam    General- Alert, Oriented, Affect-appropriate, Distress- none acute, + morbid  obesity/ looks thinner,  Skin- rash-none, lesions- none, excoriation- none Lymphadenopathy- none Head- atraumatic            Eyes- Gross vision intact, PERRLA, conjunctivae clear secretions            Ears- Hearing, canals-normal            Nose- Clear, no-Septal dev, mucus, polyps, erosion, perforation.            Throat- Mallampati III , mucosa clear , drainage- none, tonsils- atrophic,                           + Missing  teeth,                 Neck- flexible , trachea+ stoma scar, no stridor , thyroid nl, carotid no bruit Chest - symmetrical excursion , unlabored           Heart/CV- RRR , no murmur , no gallop  , no rub, nl s1 s2                           - JVD- none ,  edema- none, stasis changes- none, varices- none           Lung- +Diminished, . Wheeze-none, cough-none,  dullness-none,  rub- none            Chest wall Abd-  Br/ Gen/ Rectal- Not done, not indicated Extrem- cyanosis- none, clubbing, none, atrophy- none, strength- nl Neuro- grossly intact to observation

## 2024-03-17 NOTE — Patient Instructions (Signed)
 VISIT SUMMARY:  Today, we discussed your medication management and addressed your left arm pain. We reviewed your current medications and made some adjustments to better manage your conditions. We also discussed your recent falls and balance issues, and we have a plan to help improve your overall health and well-being.  YOUR PLAN:  -MORBID OBESITY STATUS POST ROUX-EN-Y GASTRIC BYPASS: You are continuing with your post-surgical weight management. Weight loss is expected to improve your overall health, including knee pain and balance. Please continue with your balance program and focus on weight loss.  -TYPE 2 DIABETES MELLITUS: Your diabetes management is under review following your surgery. Your last A1c was 5.9. We have decided to restart Rybelsus  at 3 mg, pending approval from your bariatric surgeon. We also checked your A1c level.  -HYPERTENSION ASSOCIATED WITH TYPE 2 DIABETES: Your blood pressure is being managed with carvedilol  and clonidine . Please continue taking these medications as prescribed.  -ATRIAL FIBRILLATION: Your atrial fibrillation is being managed with apixaban . Please continue taking this medication as prescribed.  -ASTHMA: Your asthma management remains the same. Please continue with your current asthma regimen.  -CHRONIC PAIN SYNDROME: Your chronic pain is being managed with pregabalin . Please continue taking this medication as prescribed and follow up with your pain management care.  -LEFT UPPER ARM PAIN: You have severe pain in your left upper arm that affects your daily activities. We have ordered a muscle enzyme test and prescribed tizanidine  as needed and lidocaine  patches. If the pain persists, we may refer you to orthopedics.  -KNEE PAIN WITH GAIT AND BALANCE DISTURBANCE: Your knee pain is likely related to your weight and alignment. Weight loss is expected to improve your symptoms. Please continue with your current management and focus on weight  loss.  -HYPOTHYROIDISM: Your hypothyroidism is being managed with levothyroxine . We checked your TSH level and will adjust your medication if necessary.  -VITAMIN D  DEFICIENCY, STATUS POST SUPPLEMENTATION: We are monitoring your vitamin D  levels. We checked your vitamin D  level today.  -HYPOKALEMIA, STATUS POST SUPPLEMENTATION: We are monitoring your potassium levels to avoid hyperkalemia. We checked your potassium level today.  -GASTROESOPHAGEAL REFLUX DISEASE (GERD): Your GERD is being managed with pantoprazole . Please continue taking this medication as prescribed.  -GENERAL HEALTH MAINTENANCE: You are due for a flu shot. Please make sure to get vaccinated.  INSTRUCTIONS:  Please follow up with your bariatric surgeon regarding the restart of Rybelsus . If your left arm pain persists, we may need to refer you to an orthopedic specialist. Make sure to get your flu shot as soon as possible.

## 2024-03-18 ENCOUNTER — Encounter: Payer: Self-pay | Admitting: Internal Medicine

## 2024-03-18 ENCOUNTER — Ambulatory Visit: Payer: Self-pay | Admitting: Family Medicine

## 2024-03-18 ENCOUNTER — Ambulatory Visit (INDEPENDENT_AMBULATORY_CARE_PROVIDER_SITE_OTHER): Admitting: Internal Medicine

## 2024-03-18 VITALS — BP 118/68 | HR 70 | Temp 98.5°F | Ht 65.0 in | Wt 253.8 lb

## 2024-03-18 DIAGNOSIS — I482 Chronic atrial fibrillation, unspecified: Secondary | ICD-10-CM | POA: Diagnosis not present

## 2024-03-18 DIAGNOSIS — J4489 Other specified chronic obstructive pulmonary disease: Secondary | ICD-10-CM

## 2024-03-18 DIAGNOSIS — E038 Other specified hypothyroidism: Secondary | ICD-10-CM

## 2024-03-18 NOTE — Patient Instructions (Signed)
 Proud of you for getting the Bariatirc surgery done! I think it will help you a lot.  Please call if we can do anything for you.

## 2024-03-19 LAB — ALDOLASE: Aldolase: 6.7 U/L (ref 3.3–10.3)

## 2024-03-19 LAB — HEMOGLOBIN A1C
Est. average glucose Bld gHb Est-mCnc: 108 mg/dL
Hgb A1c MFr Bld: 5.4 % (ref 4.8–5.6)

## 2024-03-19 LAB — T4, FREE: Free T4: 1.13 ng/dL (ref 0.82–1.77)

## 2024-03-19 LAB — MICROALBUMIN / CREATININE URINE RATIO
Creatinine, Urine: 379.3 mg/dL
Microalb/Creat Ratio: 3 mg/g{creat} (ref 0–29)
Microalbumin, Urine: 12 ug/mL

## 2024-03-19 LAB — VITAMIN D 25 HYDROXY (VIT D DEFICIENCY, FRACTURES): Vit D, 25-Hydroxy: 65.2 ng/mL (ref 30.0–100.0)

## 2024-03-19 LAB — T3: T3, Total: 149 ng/dL (ref 71–180)

## 2024-03-19 LAB — TSH: TSH: 2.79 u[IU]/mL (ref 0.450–4.500)

## 2024-03-19 LAB — POTASSIUM: Potassium: 4.8 mmol/L (ref 3.5–5.2)

## 2024-03-20 ENCOUNTER — Encounter: Payer: Self-pay | Admitting: Internal Medicine

## 2024-03-20 NOTE — Assessment & Plan Note (Signed)
 Currently well-controlled Plan- watch through winter as weight loss continues

## 2024-03-20 NOTE — Assessment & Plan Note (Signed)
 RSR or very well regulated AF at this visit

## 2024-03-22 ENCOUNTER — Ambulatory Visit: Attending: Family Medicine

## 2024-03-22 MED ORDER — LEVOTHYROXINE SODIUM 50 MCG PO TABS: 50.0000 ug | ORAL_TABLET | Freq: Every day | ORAL | 1 refills | Status: AC

## 2024-03-23 LAB — SPECIMEN STATUS REPORT

## 2024-03-23 LAB — CK: Total CK: 87 U/L (ref 32–182)

## 2024-03-29 ENCOUNTER — Other Ambulatory Visit: Payer: Self-pay

## 2024-03-30 MED ORDER — TEZEPELUMAB-EKKO 210 MG/1.91ML ~~LOC~~ SOSY: 210.0000 mg | PREFILLED_SYRINGE | Freq: Once | SUBCUTANEOUS | Status: DC

## 2024-04-01 ENCOUNTER — Other Ambulatory Visit: Payer: Self-pay | Admitting: Internal Medicine

## 2024-04-01 ENCOUNTER — Ambulatory Visit

## 2024-04-01 VITALS — BP 128/78 | Temp 97.5°F | Resp 18 | Ht 65.0 in | Wt 248.6 lb

## 2024-04-01 DIAGNOSIS — J4489 Other specified chronic obstructive pulmonary disease: Secondary | ICD-10-CM

## 2024-04-01 DIAGNOSIS — J4552 Severe persistent asthma with status asthmaticus: Secondary | ICD-10-CM

## 2024-04-01 MED ORDER — TEZEPELUMAB-EKKO 210 MG/1.91ML ~~LOC~~ SOSY
210.0000 mg | PREFILLED_SYRINGE | Freq: Once | SUBCUTANEOUS | Status: AC
  Administered 2024-04-01: 210 mg via SUBCUTANEOUS
  Filled 2024-04-01: qty 1.91

## 2024-04-01 NOTE — Telephone Encounter (Signed)
 Clonazepam refilled

## 2024-04-01 NOTE — Progress Notes (Signed)
 Diagnosis: Asthma  Provider:  Chilton Greathouse MD  Procedure: Injection  Tezspire (Tezepelumab), Dose: 210 mg, Site: subcutaneous, Number of injections: 1  Injection Site(s): Right arm  Post Care: Patient declined observation  Discharge: Condition: Good, Destination: Home . AVS Declined  Performed by:  Adriana Mccallum, RN

## 2024-04-05 ENCOUNTER — Telehealth: Payer: Self-pay | Admitting: *Deleted

## 2024-04-05 NOTE — Telephone Encounter (Signed)
 Patient has already picked up per pharmacist. NFN  Copied from CRM [id number]. Topic: Clinical - Prescription Issue >> Apr 05, 2024 12:06 PM Isabell A wrote: Patient requesting for this medication to be sent to   [facility]   Address: [address], [place], [place] [address] Phone: [phone]

## 2024-04-06 MED ORDER — CLONAZEPAM 0.5 MG PO TABS: 0.5000 mg | ORAL_TABLET | Freq: Every evening | ORAL | 5 refills | Status: AC | PRN

## 2024-04-06 NOTE — Addendum Note (Signed)
 Addended by: NEYSA RAMA D on: 04/06/2024 04:54 PM   Modules accepted: Orders

## 2024-04-06 NOTE — Telephone Encounter (Signed)
 Clonazepam  sent [employer]

## 2024-04-06 NOTE — Telephone Encounter (Signed)
 Copied from CRM #[id number]. Topic: Clinical - Prescription Issue >> Apr 06, 2024  1:33 PM Rilla B wrote: Reason for CRM: Patient states her refill has not arrived to the pharmacy. States she asked that the script be sent to [facility] on [address] and has checked and it's not there. Per notes, script was sent. Patient would like at call @ [phone].   Called and spoke to pharmacy that the refill was originally sent to. Pharmacy states that due to a system error, they did not receive the refill request. The pharmacy technician stated that we would need to resend the prescription.  Called pt and provided update.   Dr. Neysa, can you please resend the Clonazepam  refill? Thank you!  Pt requested [facility] on [address].

## 2024-04-07 NOTE — Telephone Encounter (Signed)
 Copied from CRM #[id number]. Topic: Clinical - Medication Question >> Apr 05, 2024  4:58 PM Lavanda D wrote: Reason for CRM: Patient called back regarding clonazepam , advised that pharmacy stated she already picked up the medication.  She states this is not true. Med should be sent to the following pharmacy:   Address: [address] [place], [place] [address] Phone: [phone] >> Apr 06, 2024  1:26 PM Ismael A wrote: Pt has called back again regarding refill for Clonazepam , states she spoke with [facility] on [address] today but they state they have not received a refill for this - I went over recent note  Snipes, Sonya K, CMA  Certified Medical Assistant   Encounter Date: 04/05/2024   Signed      Patient has already picked up per pharmacist. NFN   Copied from CRM [id number]. Topic: Clinical - Prescription Issue >> Apr 05, 2024 12:06 PM Isabell A wrote: Patient requesting for this medication to be sent to   [facility]   Address: [address], [place], [place] [address] Phone: [phone]         I advised her to follow up with [facility] on [address] to figure out why the pharmacist is stating that the medication has been picked up  Pharmacy states patient has picked up Rx on 04/06/24 @ 7:43 pm   - NFN

## 2024-04-08 NOTE — Telephone Encounter (Signed)
 Called patient ,informed patient picked up yesterday.NFN

## 2024-04-18 ENCOUNTER — Other Ambulatory Visit: Payer: Self-pay | Admitting: Internal Medicine

## 2024-04-19 ENCOUNTER — Other Ambulatory Visit: Payer: Self-pay | Admitting: Internal Medicine

## 2024-04-19 ENCOUNTER — Other Ambulatory Visit (HOSPITAL_COMMUNITY): Payer: Self-pay

## 2024-04-19 DIAGNOSIS — J4489 Other specified chronic obstructive pulmonary disease: Secondary | ICD-10-CM

## 2024-04-19 DIAGNOSIS — J4541 Moderate persistent asthma with (acute) exacerbation: Secondary | ICD-10-CM

## 2024-04-20 ENCOUNTER — Other Ambulatory Visit: Payer: Self-pay

## 2024-04-20 NOTE — Telephone Encounter (Signed)
 Pt requesting refill of specialty medication - routing to Rx team to advise.

## 2024-04-21 ENCOUNTER — Other Ambulatory Visit: Payer: Self-pay

## 2024-04-26 ENCOUNTER — Other Ambulatory Visit (HOSPITAL_COMMUNITY): Payer: Self-pay

## 2024-04-26 ENCOUNTER — Other Ambulatory Visit: Payer: Self-pay

## 2024-04-26 MED ORDER — TEZSPIRE 210 MG/1.91ML ~~LOC~~ SOAJ
210.0000 mg | SUBCUTANEOUS | 5 refills | Status: DC
  Filled 2024-04-26 (×2): qty 1.91, 28d supply, fill #0
  Filled 2024-05-17 (×2): qty 1.91, 28d supply, fill #1

## 2024-04-26 NOTE — Progress Notes (Signed)
 Specialty Pharmacy Refill Coordination Note  In-office administered. Patient/Guardian authorizes monthly copay charge  April [name] is a 53 y.o. female contacted today regarding refills of specialty medication(s) Tezepelumab -ekko (Tezspire )  Injection appointment: 04/30/24  Patient requested: Courier to Provider Office   Delivery date: 04/27/24   Verified address: [facility] [employer] [address] [place] [address]  Medication will be filled on 04/26/24 .

## 2024-04-26 NOTE — Telephone Encounter (Signed)
 Refill sent for TEZSPIRE  to [facility]: [phone]   Dose: 210mg  Pacheco every 28 days   Last OV: 03/18/24 Provider: Dr. Neysa  Next OV: due May 2026, not yet scheduled  Routing to scheduling team for follow-up on appt scheduling  Aleck Puls, PharmD, BCPS Clinical Pharmacist  [facility]

## 2024-04-27 ENCOUNTER — Other Ambulatory Visit (HOSPITAL_COMMUNITY): Payer: Self-pay

## 2024-04-30 ENCOUNTER — Ambulatory Visit (INDEPENDENT_AMBULATORY_CARE_PROVIDER_SITE_OTHER)

## 2024-04-30 VITALS — BP 147/71 | HR 60 | Temp 97.9°F | Resp 20 | Ht 65.0 in | Wt 248.8 lb

## 2024-04-30 DIAGNOSIS — J4552 Severe persistent asthma with status asthmaticus: Secondary | ICD-10-CM | POA: Diagnosis not present

## 2024-04-30 DIAGNOSIS — J4489 Other specified chronic obstructive pulmonary disease: Secondary | ICD-10-CM

## 2024-04-30 MED ORDER — TEZEPELUMAB-EKKO 210 MG/1.91ML ~~LOC~~ SOSY
210.0000 mg | PREFILLED_SYRINGE | Freq: Once | SUBCUTANEOUS | Status: AC
  Administered 2024-04-30: 210 mg via SUBCUTANEOUS
  Filled 2024-04-30: qty 1.91

## 2024-04-30 NOTE — Progress Notes (Signed)
 Diagnosis: , Asthma  Provider:  Mannam, Praveen MD  Procedure: Injection  Tezspire  (Tezepelumab ), Dose: 210 mg, Site: subcutaneous, Number of injections: 1  Injection Site(s): Right arm  Post Care: Injection  Discharge: Condition: Good, Destination: Home . AVS Declined  Performed by:  Donny Childes, RN

## 2024-05-06 ENCOUNTER — Other Ambulatory Visit: Payer: Self-pay | Admitting: Family Medicine

## 2024-05-11 ENCOUNTER — Encounter: Attending: Surgery | Admitting: Skilled Nursing Facility1

## 2024-05-11 DIAGNOSIS — E669 Obesity, unspecified: Secondary | ICD-10-CM | POA: Insufficient documentation

## 2024-05-12 ENCOUNTER — Encounter: Payer: Self-pay | Admitting: Skilled Nursing Facility1

## 2024-05-12 NOTE — Progress Notes (Signed)
 Follow-up visit:  Post-Operative RYGB Surgery  Medical Nutrition Therapy:  Appt start time: 5:33 end time:  6:15  This was a class of 4 patients.   Primary concerns today: Post-operative Bariatric Surgery Nutrition Management 6 Month Post-Op Class  Surgery date: 11/17/2023 Surgery type: RYGB  NUTRITION ASSESSMENT  Anthropometrics  Start weight at [facility]: 323.9 lbs (date: 05/01/2023)  Height: 65 in Weight today: 239.7 lbs   Clinical   Pharmacotherapy: History of weight loss medication used: phentermine , Ozempic  (not tolerated)  Medical hx: asthma, COPD, HTN Medications:  omeprazole , gabapentin , albuterol , clonazepam , epinephrine , famotidine , Flonase , Rybelsus , Ambien , vit D, calcium   Labs: Chol 214; Triglycerides 171; LDL 127; A1c 5.8, folate 2.4, Vit D 25.5 Notable signs/symptoms: walking slow Any previous deficiencies? No Bowel Habits: Every day to every other day no complaints   Body Composition Scale 12/02/2023 01/29/2024  Current Body Weight 301.7 268.2  Total Body Fat % 49.3 46.8  Visceral Fat 21 18  Fat-Free Mass % 50.6 53.1   Total Body Water  % 39.8 41.0  Muscle-Mass lbs 32.4 32.1  BMI 49.9 44.3  Body Fat Displacement           Torso  lbs 92.4 78.0         Left Leg  lbs 18.4 15.6         Right Leg  lbs 18.4 15.6         Left Arm  lbs 9.2 7.8         Right Arm  lbs 9.2 7.8   Pt states she is currently grieving the loss of her daughter: Dietitian advised to seek counseling to assist her in her grieving process.   Information Reviewed/ Discussed During Appointment: -Review of composition scale numbers -Fluid requirements (64-100 ounces) -Protein requirements (60-80g) -Strategies for tolerating diet -Advancement of diet to include Starchy vegetables -Barriers to inclusion of new foods -Inclusion of appropriate multivitamin and calcium  supplements  -Exercise recommendations   Fluid intake: adequate   Medications: See List Supplementation: appropriate     Using straws: no Drinking while eating: no Having you been chewing well: yes Chewing/swallowing difficulties: no Changes in vision: no Changes to mood/headaches: no Hair loss/Cahnges to skin/Changes to nails: no Any difficulty focusing or concentrating: no Sweating: no Dizziness/Lightheaded: no Palpitations: no  Carbonated beverages: no N/V/D/C/GAS: no Abdominal Pain: no Dumping syndrome: no  Recent physical activity:  ADL's  Progress Towards Goal(s):  In Progress Teaching method utilized: Visual & Auditory  Demonstrated degree of understanding via: Teach Back  Readiness Level: Action Barriers to learning/adherence to lifestyle change: none identified  Handouts given during visit include: Phase V diet Progression  Goals Sheet The Benefits of Exercise are endless..... Support Group Topics  Teaching Method Utilized:  Visual Auditory Hands on  Demonstrated degree of understanding via:  Teach Back   Monitoring/Evaluation:  Dietary intake, exercise, and body weight. Follow up in 3 months for 9 month post-op visit.

## 2024-05-13 ENCOUNTER — Telehealth: Payer: Self-pay

## 2024-05-13 NOTE — Telephone Encounter (Signed)
 Tezspire  has been approved through the patients pharmacy benefit.  [facility] will send the Tezspire  to [facility] for treatment.

## 2024-05-17 ENCOUNTER — Other Ambulatory Visit: Payer: Self-pay

## 2024-05-17 NOTE — Progress Notes (Signed)
 Specialty Pharmacy Refill Coordination Note  April [name] is a 54 y.o. female contacted today regarding refills of specialty medication(s) Tezepelumab -ekko (Tezspire )   Patient requested Courier to Provider Office   Delivery date: 05/31/24   Verified address: [employer] [address] [name] [address]   Medication will be filled on: 05/24/24    Patient is aware of the $12.65 copay, patient did not feel comfortable providing payment information over the phone. [facility] payment information on file.

## 2024-05-19 ENCOUNTER — Other Ambulatory Visit: Payer: Self-pay

## 2024-05-21 ENCOUNTER — Other Ambulatory Visit: Payer: Self-pay

## 2024-05-21 NOTE — Telephone Encounter (Signed)
 Submitted Tezspire  precertification on [facility] provider portal  Auth request # [id number]

## 2024-05-24 ENCOUNTER — Other Ambulatory Visit (HOSPITAL_COMMUNITY): Payer: Self-pay

## 2024-05-24 NOTE — Telephone Encounter (Signed)
 Pre-certification for Tezspire  [id number] approved from 05/21/2024 to 05/20/2025.  Auth # [id number]  FYI flag should be removed as patient will be B/B. Last clear bagged dose will be 05/31/2024  Sherry Pennant, PharmD, MPH, BCPS, CPP Clinical Pharmacist

## 2024-05-24 NOTE — Addendum Note (Signed)
 Addended by: DAYNE SHERRY RAMAN on: 05/24/2024 11:58 AM   Modules accepted: Orders

## 2024-05-26 NOTE — Telephone Encounter (Signed)
 Patient has three in-date Tezspire  doses in Pyxis. Will need to use this supply before dropping J2356 charge.

## 2024-05-27 NOTE — Addendum Note (Signed)
 Addended by: Deadrian Toya L on: 05/27/2024 12:51 PM   Modules accepted: Orders

## 2024-05-31 ENCOUNTER — Ambulatory Visit

## 2024-06-01 ENCOUNTER — Telehealth: Payer: Self-pay

## 2024-06-01 NOTE — Telephone Encounter (Signed)
 Copied from CRM [id number]. Topic: Clinical - Prescription Issue >> May 27, 2024 10:05 AM Rilla NOVAK wrote: Reason for CRM: Brittany, Pharmacist, from [facility] calling.  Sent over a fax on 05/11/2024 and has not had a response. Verified FAX number.  Refaxing request today.   Question please call [phone]  I have looked in Dr. Neysa bin and nothing has been received.  Routing to pharmacy & admin to schedule an ov with provider.

## 2024-06-01 NOTE — Telephone Encounter (Signed)
 Patient has three in-date Tezspire  doses in Pyxis. Will need to use this supply before dropping J2356 charge.     Will not be using pharmacy supply for patient's Tezspire  moving forward. Approved through B/B  Sherry Pennant, PharmD, MPH, BCPS, CPP Clinical Pharmacist

## 2024-06-02 ENCOUNTER — Ambulatory Visit

## 2024-06-02 ENCOUNTER — Telehealth: Payer: Self-pay

## 2024-06-02 VITALS — BP 160/92 | HR 87 | Temp 97.7°F | Resp 20 | Ht 65.0 in | Wt 236.0 lb

## 2024-06-02 DIAGNOSIS — J4489 Other specified chronic obstructive pulmonary disease: Secondary | ICD-10-CM | POA: Diagnosis not present

## 2024-06-02 DIAGNOSIS — J4552 Severe persistent asthma with status asthmaticus: Secondary | ICD-10-CM

## 2024-06-02 MED ORDER — TEZEPELUMAB-EKKO 210 MG/1.91ML ~~LOC~~ SOSY
210.0000 mg | PREFILLED_SYRINGE | Freq: Once | SUBCUTANEOUS | Status: AC
  Administered 2024-06-02: 210 mg via SUBCUTANEOUS
  Filled 2024-06-02: qty 1.91

## 2024-06-02 NOTE — Telephone Encounter (Signed)
 Copied from CRM [id number]. Topic: Clinical - Prescription Issue >> Jun 02, 2024 11:27 AM Celestine FALCON wrote: Reason for CRM: Violet from [facility] calling regarding a couple faxes that were sent over in late Jan 2026 with no response..   I verified the fax number, and Violet said she would faxing it over again today. I asked if this was for PA for the infusion, but she stated it was not.    If you have questions or concerns please call [phone].    Previous MD has retired patient has up coming appointment with new MD

## 2024-06-02 NOTE — Progress Notes (Signed)
 Diagnosis: Asthma  Provider:  Chilton Greathouse MD  Procedure: Injection  Tezspire (Tezepelumab), Dose: 210 mg, Site: subcutaneous, Number of injections: 1  Injection Site(s): Right arm  Post Care:  right arm injection  Discharge: Condition: Good, Destination: Home . AVS Declined  Performed by:  Rico Ala, LPN

## 2024-06-30 ENCOUNTER — Ambulatory Visit

## 2024-08-09 ENCOUNTER — Encounter: Admitting: Dietician

## 2024-09-14 ENCOUNTER — Encounter
